# Patient Record
Sex: Female | Born: 1945 | Race: Black or African American | Hispanic: No | Smoking: Former smoker
Health system: Southern US, Community
[De-identification: ages and names within clinical notes are randomized; demographics above are authoritative.]

## PROBLEM LIST (undated history)

## (undated) DIAGNOSIS — K219 Gastro-esophageal reflux disease without esophagitis: Secondary | ICD-10-CM

## (undated) DIAGNOSIS — R0600 Dyspnea, unspecified: Secondary | ICD-10-CM

## (undated) DIAGNOSIS — K224 Dyskinesia of esophagus: Secondary | ICD-10-CM

## (undated) DIAGNOSIS — K222 Esophageal obstruction: Secondary | ICD-10-CM

## (undated) DIAGNOSIS — K449 Diaphragmatic hernia without obstruction or gangrene: Secondary | ICD-10-CM

## (undated) DIAGNOSIS — IMO0002 Reserved for concepts with insufficient information to code with codable children: Secondary | ICD-10-CM

## (undated) DIAGNOSIS — G5790 Unspecified mononeuropathy of unspecified lower limb: Secondary | ICD-10-CM

## (undated) DIAGNOSIS — G4730 Sleep apnea, unspecified: Secondary | ICD-10-CM

## (undated) DIAGNOSIS — D573 Sickle-cell trait: Secondary | ICD-10-CM

## (undated) DIAGNOSIS — K2970 Gastritis, unspecified, without bleeding: Secondary | ICD-10-CM

## (undated) DIAGNOSIS — K5790 Diverticulosis of intestine, part unspecified, without perforation or abscess without bleeding: Secondary | ICD-10-CM

## (undated) DIAGNOSIS — D649 Anemia, unspecified: Secondary | ICD-10-CM

## (undated) DIAGNOSIS — E785 Hyperlipidemia, unspecified: Secondary | ICD-10-CM

## (undated) DIAGNOSIS — K3184 Gastroparesis: Secondary | ICD-10-CM

## (undated) DIAGNOSIS — N183 Chronic kidney disease, stage 3 unspecified: Secondary | ICD-10-CM

## (undated) DIAGNOSIS — F329 Major depressive disorder, single episode, unspecified: Secondary | ICD-10-CM

## (undated) DIAGNOSIS — I10 Essential (primary) hypertension: Secondary | ICD-10-CM

## (undated) DIAGNOSIS — I639 Cerebral infarction, unspecified: Secondary | ICD-10-CM

## (undated) DIAGNOSIS — H269 Unspecified cataract: Secondary | ICD-10-CM

## (undated) DIAGNOSIS — I2510 Atherosclerotic heart disease of native coronary artery without angina pectoris: Secondary | ICD-10-CM

## (undated) DIAGNOSIS — H9193 Unspecified hearing loss, bilateral: Secondary | ICD-10-CM

## (undated) DIAGNOSIS — N259 Disorder resulting from impaired renal tubular function, unspecified: Secondary | ICD-10-CM

## (undated) DIAGNOSIS — G5602 Carpal tunnel syndrome, left upper limb: Secondary | ICD-10-CM

## (undated) DIAGNOSIS — B029 Zoster without complications: Secondary | ICD-10-CM

## (undated) DIAGNOSIS — M1990 Unspecified osteoarthritis, unspecified site: Secondary | ICD-10-CM

## (undated) DIAGNOSIS — F32A Depression, unspecified: Secondary | ICD-10-CM

## (undated) DIAGNOSIS — J449 Chronic obstructive pulmonary disease, unspecified: Secondary | ICD-10-CM

## (undated) DIAGNOSIS — R0902 Hypoxemia: Secondary | ICD-10-CM

## (undated) DIAGNOSIS — T7840XA Allergy, unspecified, initial encounter: Secondary | ICD-10-CM

## (undated) DIAGNOSIS — D126 Benign neoplasm of colon, unspecified: Secondary | ICD-10-CM

## (undated) DIAGNOSIS — R569 Unspecified convulsions: Secondary | ICD-10-CM

## (undated) DIAGNOSIS — I509 Heart failure, unspecified: Secondary | ICD-10-CM

## (undated) DIAGNOSIS — J986 Disorders of diaphragm: Secondary | ICD-10-CM

## (undated) HISTORY — PX: ABDOMINAL HYSTERECTOMY: SHX81

## (undated) HISTORY — DX: Chronic kidney disease, stage 3 unspecified: N18.30

## (undated) HISTORY — DX: Heart failure, unspecified: I50.9

## (undated) HISTORY — DX: Gastroparesis: K31.84

## (undated) HISTORY — DX: Unspecified cataract: H26.9

## (undated) HISTORY — DX: Benign neoplasm of colon, unspecified: D12.6

## (undated) HISTORY — DX: Diverticulosis of intestine, part unspecified, without perforation or abscess without bleeding: K57.90

## (undated) HISTORY — DX: Gastritis, unspecified, without bleeding: K29.70

## (undated) HISTORY — DX: Unspecified mononeuropathy of unspecified lower limb: G57.90

## (undated) HISTORY — DX: Reserved for concepts with insufficient information to code with codable children: IMO0002

## (undated) HISTORY — DX: Sleep apnea, unspecified: G47.30

## (undated) HISTORY — DX: Depression, unspecified: F32.A

## (undated) HISTORY — DX: Atherosclerotic heart disease of native coronary artery without angina pectoris: I25.10

## (undated) HISTORY — DX: Allergy, unspecified, initial encounter: T78.40XA

## (undated) HISTORY — DX: Hyperlipidemia, unspecified: E78.5

## (undated) HISTORY — DX: Essential (primary) hypertension: I10

## (undated) HISTORY — PX: HERNIA REPAIR: SHX51

## (undated) HISTORY — PX: COLONOSCOPY: SHX174

## (undated) HISTORY — DX: Major depressive disorder, single episode, unspecified: F32.9

## (undated) HISTORY — DX: Sickle-cell trait: D57.3

## (undated) HISTORY — DX: Hypoxemia: R09.02

## (undated) HISTORY — PX: OTHER SURGICAL HISTORY: SHX169

## (undated) HISTORY — DX: Disorders of diaphragm: J98.6

## (undated) HISTORY — DX: Unspecified hearing loss, bilateral: H91.93

## (undated) HISTORY — DX: Gastro-esophageal reflux disease without esophagitis: K21.9

## (undated) HISTORY — DX: Chronic kidney disease, stage 3 (moderate): N18.3

## (undated) HISTORY — DX: Unspecified convulsions: R56.9

## (undated) HISTORY — DX: Zoster without complications: B02.9

## (undated) HISTORY — PX: UPPER GASTROINTESTINAL ENDOSCOPY: SHX188

## (undated) HISTORY — DX: Esophageal obstruction: K22.2

## (undated) HISTORY — DX: Carpal tunnel syndrome, left upper limb: G56.02

## (undated) HISTORY — PX: BREAST LUMPECTOMY: SHX2

## (undated) HISTORY — DX: Chronic obstructive pulmonary disease, unspecified: J44.9

## (undated) HISTORY — DX: Dyskinesia of esophagus: K22.4

## (undated) HISTORY — DX: Cerebral infarction, unspecified: I63.9

## (undated) HISTORY — DX: Unspecified osteoarthritis, unspecified site: M19.90

## (undated) HISTORY — DX: Diaphragmatic hernia without obstruction or gangrene: K44.9

## (undated) HISTORY — DX: Anemia, unspecified: D64.9

## (undated) HISTORY — DX: Morbid (severe) obesity due to excess calories: E66.01

## (undated) HISTORY — DX: Disorder resulting from impaired renal tubular function, unspecified: N25.9

---

## 1996-01-22 HISTORY — PX: REPLACEMENT TOTAL KNEE: SUR1224

## 1997-04-21 ENCOUNTER — Encounter: Admission: RE | Admit: 1997-04-21 | Discharge: 1997-07-20 | Payer: Self-pay | Admitting: *Deleted

## 1997-06-06 ENCOUNTER — Ambulatory Visit (HOSPITAL_COMMUNITY): Admission: RE | Admit: 1997-06-06 | Discharge: 1997-06-06 | Payer: Self-pay | Admitting: Family Medicine

## 1997-08-08 ENCOUNTER — Inpatient Hospital Stay (HOSPITAL_COMMUNITY): Admission: RE | Admit: 1997-08-08 | Discharge: 1997-08-16 | Payer: Self-pay | Admitting: Orthopedic Surgery

## 1997-09-13 ENCOUNTER — Encounter: Admission: RE | Admit: 1997-09-13 | Discharge: 1997-12-12 | Payer: Self-pay | Admitting: Orthopedic Surgery

## 1997-09-22 ENCOUNTER — Ambulatory Visit (HOSPITAL_COMMUNITY): Admission: RE | Admit: 1997-09-22 | Discharge: 1997-09-22 | Payer: Self-pay | Admitting: Orthopedic Surgery

## 1998-01-24 ENCOUNTER — Ambulatory Visit (HOSPITAL_COMMUNITY): Admission: RE | Admit: 1998-01-24 | Discharge: 1998-01-25 | Payer: Self-pay | Admitting: Interventional Cardiology

## 1998-02-10 ENCOUNTER — Ambulatory Visit (HOSPITAL_COMMUNITY): Admission: RE | Admit: 1998-02-10 | Discharge: 1998-02-10 | Payer: Self-pay | Admitting: Interventional Cardiology

## 1998-08-17 ENCOUNTER — Emergency Department (HOSPITAL_COMMUNITY): Admission: EM | Admit: 1998-08-17 | Discharge: 1998-08-17 | Payer: Self-pay | Admitting: Emergency Medicine

## 1998-08-18 ENCOUNTER — Encounter: Payer: Self-pay | Admitting: Emergency Medicine

## 1998-08-23 ENCOUNTER — Encounter: Admission: RE | Admit: 1998-08-23 | Discharge: 1998-11-21 | Payer: Self-pay | Admitting: Endocrinology

## 1998-09-05 ENCOUNTER — Other Ambulatory Visit: Admission: RE | Admit: 1998-09-05 | Discharge: 1998-09-05 | Payer: Self-pay | Admitting: Nurse Practitioner

## 1999-11-08 ENCOUNTER — Encounter: Admission: RE | Admit: 1999-11-08 | Discharge: 2000-02-06 | Payer: Self-pay | Admitting: Endocrinology

## 2000-03-07 ENCOUNTER — Encounter: Admission: RE | Admit: 2000-03-07 | Discharge: 2000-04-22 | Payer: Self-pay | Admitting: Orthopedic Surgery

## 2000-07-08 ENCOUNTER — Other Ambulatory Visit: Admission: RE | Admit: 2000-07-08 | Discharge: 2000-07-08 | Payer: Self-pay | Admitting: Obstetrics and Gynecology

## 2000-08-08 ENCOUNTER — Encounter: Payer: Self-pay | Admitting: Gastroenterology

## 2000-08-08 ENCOUNTER — Ambulatory Visit (HOSPITAL_COMMUNITY): Admission: RE | Admit: 2000-08-08 | Discharge: 2000-08-08 | Payer: Self-pay | Admitting: Gastroenterology

## 2000-08-08 HISTORY — PX: OTHER SURGICAL HISTORY: SHX169

## 2000-08-26 ENCOUNTER — Ambulatory Visit (HOSPITAL_COMMUNITY): Admission: RE | Admit: 2000-08-26 | Discharge: 2000-08-26 | Payer: Self-pay | Admitting: Gastroenterology

## 2000-08-26 ENCOUNTER — Encounter (INDEPENDENT_AMBULATORY_CARE_PROVIDER_SITE_OTHER): Payer: Self-pay | Admitting: Specialist

## 2000-10-20 ENCOUNTER — Encounter: Admission: RE | Admit: 2000-10-20 | Discharge: 2000-10-20 | Payer: Self-pay | Admitting: Endocrinology

## 2000-10-20 ENCOUNTER — Encounter: Payer: Self-pay | Admitting: Endocrinology

## 2000-11-13 ENCOUNTER — Ambulatory Visit (HOSPITAL_COMMUNITY): Admission: RE | Admit: 2000-11-13 | Discharge: 2000-11-13 | Payer: Self-pay | Admitting: Gastroenterology

## 2000-11-13 ENCOUNTER — Encounter: Payer: Self-pay | Admitting: Gastroenterology

## 2000-11-24 ENCOUNTER — Encounter: Admission: RE | Admit: 2000-11-24 | Discharge: 2001-02-11 | Payer: Self-pay | Admitting: Orthopedic Surgery

## 2001-04-09 ENCOUNTER — Encounter: Admission: RE | Admit: 2001-04-09 | Discharge: 2001-04-09 | Payer: Self-pay | Admitting: Gastroenterology

## 2001-04-09 ENCOUNTER — Encounter: Payer: Self-pay | Admitting: Gastroenterology

## 2001-05-01 ENCOUNTER — Ambulatory Visit (HOSPITAL_COMMUNITY): Admission: RE | Admit: 2001-05-01 | Discharge: 2001-05-01 | Payer: Self-pay | Admitting: Gastroenterology

## 2001-05-01 ENCOUNTER — Encounter: Payer: Self-pay | Admitting: Gastroenterology

## 2001-07-10 ENCOUNTER — Encounter: Payer: Self-pay | Admitting: *Deleted

## 2001-07-10 ENCOUNTER — Ambulatory Visit (HOSPITAL_COMMUNITY): Admission: RE | Admit: 2001-07-10 | Discharge: 2001-07-10 | Payer: Self-pay | Admitting: *Deleted

## 2001-08-31 ENCOUNTER — Encounter: Admission: RE | Admit: 2001-08-31 | Discharge: 2001-11-17 | Payer: Self-pay | Admitting: Orthopedic Surgery

## 2001-09-28 ENCOUNTER — Other Ambulatory Visit: Admission: RE | Admit: 2001-09-28 | Discharge: 2001-09-28 | Payer: Self-pay | Admitting: Obstetrics and Gynecology

## 2002-01-31 ENCOUNTER — Emergency Department (HOSPITAL_COMMUNITY): Admission: EM | Admit: 2002-01-31 | Discharge: 2002-02-01 | Payer: Self-pay | Admitting: Emergency Medicine

## 2002-03-11 ENCOUNTER — Ambulatory Visit (HOSPITAL_COMMUNITY): Admission: RE | Admit: 2002-03-11 | Discharge: 2002-03-11 | Payer: Self-pay | Admitting: Endocrinology

## 2002-03-11 ENCOUNTER — Encounter: Payer: Self-pay | Admitting: Endocrinology

## 2002-09-23 ENCOUNTER — Ambulatory Visit (HOSPITAL_COMMUNITY): Admission: RE | Admit: 2002-09-23 | Discharge: 2002-09-23 | Payer: Self-pay | Admitting: Endocrinology

## 2002-09-23 ENCOUNTER — Encounter: Payer: Self-pay | Admitting: Endocrinology

## 2002-10-05 ENCOUNTER — Encounter: Payer: Self-pay | Admitting: Endocrinology

## 2002-10-05 ENCOUNTER — Ambulatory Visit (HOSPITAL_COMMUNITY): Admission: RE | Admit: 2002-10-05 | Discharge: 2002-10-05 | Payer: Self-pay | Admitting: Endocrinology

## 2003-04-22 ENCOUNTER — Other Ambulatory Visit: Admission: RE | Admit: 2003-04-22 | Discharge: 2003-04-22 | Payer: Self-pay | Admitting: Obstetrics and Gynecology

## 2003-04-27 ENCOUNTER — Encounter: Admission: RE | Admit: 2003-04-27 | Discharge: 2003-06-06 | Payer: Self-pay | Admitting: Orthopedic Surgery

## 2003-07-05 ENCOUNTER — Ambulatory Visit (HOSPITAL_COMMUNITY): Admission: RE | Admit: 2003-07-05 | Discharge: 2003-07-05 | Payer: Self-pay | Admitting: Gastroenterology

## 2003-07-05 ENCOUNTER — Encounter (INDEPENDENT_AMBULATORY_CARE_PROVIDER_SITE_OTHER): Payer: Self-pay | Admitting: Specialist

## 2003-07-13 ENCOUNTER — Encounter
Admission: RE | Admit: 2003-07-13 | Discharge: 2003-10-11 | Payer: Self-pay | Admitting: Physical Medicine & Rehabilitation

## 2003-07-27 ENCOUNTER — Ambulatory Visit (HOSPITAL_COMMUNITY): Admission: RE | Admit: 2003-07-27 | Discharge: 2003-07-27 | Payer: Self-pay | Admitting: Gastroenterology

## 2003-07-28 ENCOUNTER — Inpatient Hospital Stay (HOSPITAL_BASED_OUTPATIENT_CLINIC_OR_DEPARTMENT_OTHER): Admission: RE | Admit: 2003-07-28 | Discharge: 2003-07-28 | Payer: Self-pay | Admitting: Cardiovascular Disease

## 2003-09-22 ENCOUNTER — Ambulatory Visit (HOSPITAL_COMMUNITY): Admission: RE | Admit: 2003-09-22 | Discharge: 2003-09-22 | Payer: Self-pay | Admitting: Gastroenterology

## 2003-12-01 ENCOUNTER — Ambulatory Visit: Payer: Self-pay | Admitting: Endocrinology

## 2004-01-04 ENCOUNTER — Ambulatory Visit: Payer: Self-pay | Admitting: Gastroenterology

## 2004-01-11 ENCOUNTER — Ambulatory Visit: Payer: Self-pay | Admitting: Gastroenterology

## 2004-01-11 ENCOUNTER — Ambulatory Visit (HOSPITAL_COMMUNITY): Admission: RE | Admit: 2004-01-11 | Discharge: 2004-01-11 | Payer: Self-pay | Admitting: Gastroenterology

## 2004-01-12 ENCOUNTER — Ambulatory Visit: Payer: Self-pay | Admitting: Endocrinology

## 2004-01-13 ENCOUNTER — Ambulatory Visit: Payer: Self-pay | Admitting: Endocrinology

## 2004-01-19 ENCOUNTER — Ambulatory Visit (HOSPITAL_COMMUNITY): Admission: RE | Admit: 2004-01-19 | Discharge: 2004-01-19 | Payer: Self-pay | Admitting: Endocrinology

## 2004-01-27 ENCOUNTER — Ambulatory Visit: Payer: Self-pay | Admitting: Internal Medicine

## 2004-02-03 ENCOUNTER — Ambulatory Visit: Payer: Self-pay | Admitting: Internal Medicine

## 2004-02-08 ENCOUNTER — Ambulatory Visit: Payer: Self-pay | Admitting: Endocrinology

## 2004-02-20 ENCOUNTER — Ambulatory Visit: Payer: Self-pay | Admitting: Endocrinology

## 2004-02-21 ENCOUNTER — Ambulatory Visit: Payer: Self-pay | Admitting: Internal Medicine

## 2004-02-28 ENCOUNTER — Ambulatory Visit: Payer: Self-pay | Admitting: Internal Medicine

## 2004-03-30 ENCOUNTER — Emergency Department (HOSPITAL_COMMUNITY): Admission: EM | Admit: 2004-03-30 | Discharge: 2004-03-31 | Payer: Self-pay | Admitting: Emergency Medicine

## 2004-05-17 ENCOUNTER — Ambulatory Visit: Payer: Self-pay | Admitting: Endocrinology

## 2004-06-12 ENCOUNTER — Ambulatory Visit: Payer: Self-pay | Admitting: Endocrinology

## 2004-06-14 ENCOUNTER — Ambulatory Visit: Payer: Self-pay | Admitting: Endocrinology

## 2004-07-16 ENCOUNTER — Ambulatory Visit: Payer: Self-pay | Admitting: Endocrinology

## 2004-08-20 ENCOUNTER — Ambulatory Visit: Payer: Self-pay | Admitting: Endocrinology

## 2004-10-20 IMAGING — RF DG ESOPHAGUS
13 of 14 series · 19 of 24 positions shown · non-contrast
Comparison: none

CLINICAL DATA: Dysphagia.  Chest discomfort.  Patient had dilatation on 07/05/03.  
ESOPHAGOGRAM
The study was performed with air-contrast technique with the patient in erect, prone and supine positions.  Swallowing function is normal.  There is diffuse spondylosis of the cervical spine with mild extrinsic compression on the cervical esophagus.  There is mild prominence of the cricopharyngeus muscle.  Primary peristaltic wave is satisfactory with satisfactory emptying into the stomach.   No obstruction of the passage of a 13 mm tablet through the cervical esophagus or through the mid or distal esophagus.   There is no obstruction of the esophagus.   
There is noted to be a moderate degree of gastroesophageal reflux without a significant hiatal hernia.  
IMPRESSION  Moderate degree of GE reflux

[Series 1: run · 2 of 17 slices shown (1 of 13)]
[im 1/17]
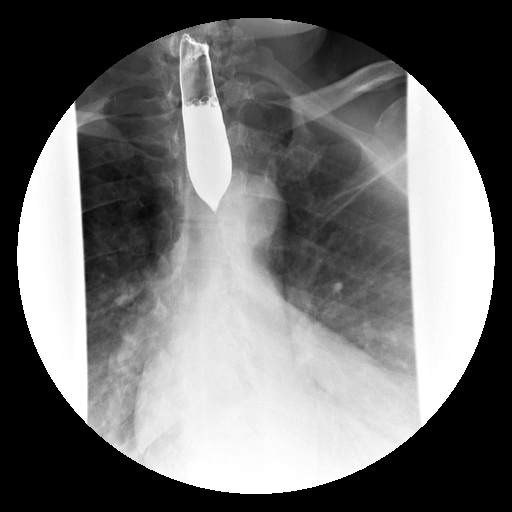
[im 17/17]
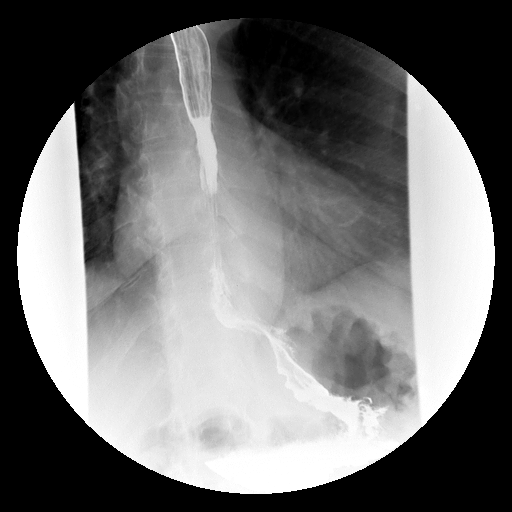

[Series 2: run · 1 of 19 slices shown (2 of 13)]
[im 19/19]
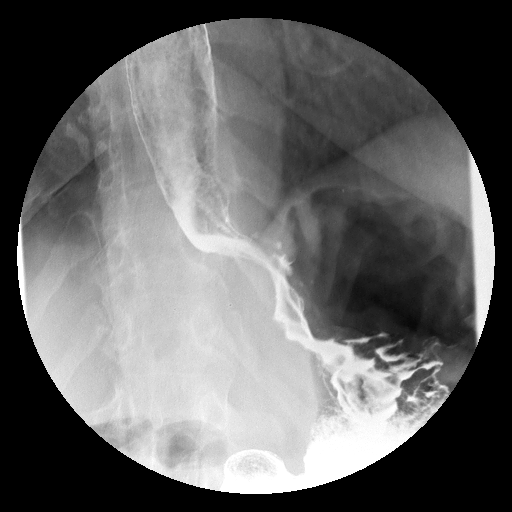

[Series 3: run · 1 of 10 slices shown (3 of 13)]
[im 1/10]
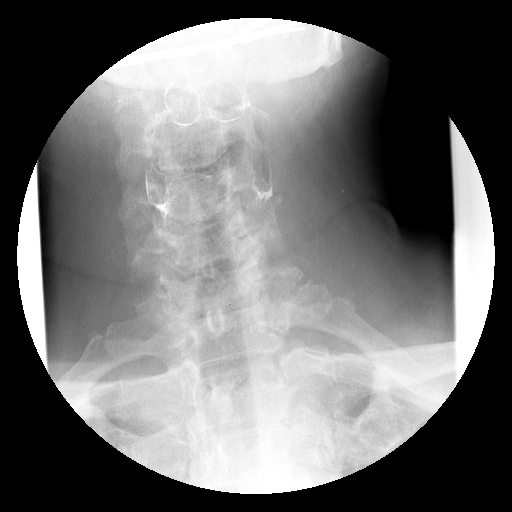

[Series 4: run · 1 of 13 slices shown (4 of 13)]
[im 1/13]
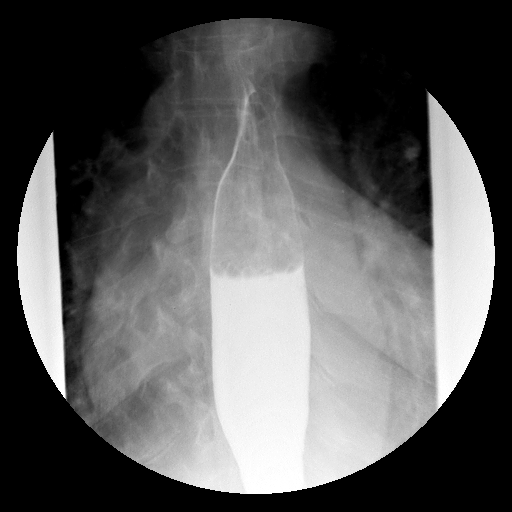

[Series 5: run · 1 of 22 slices shown (5 of 13)]
[im 1/22]
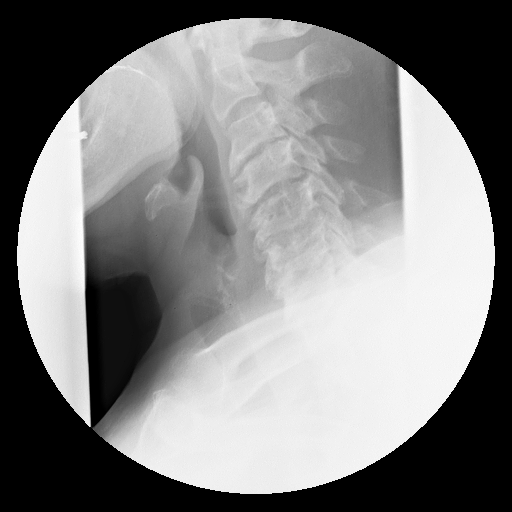

[Series 6: run · 3 of 21 slices shown (6 of 13)]
[im 1/21]
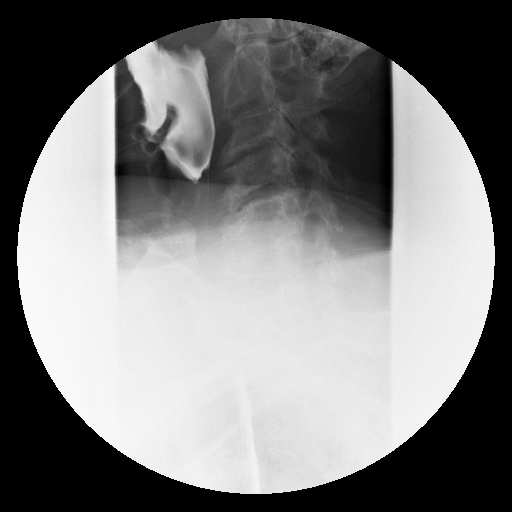
[im 11/21]
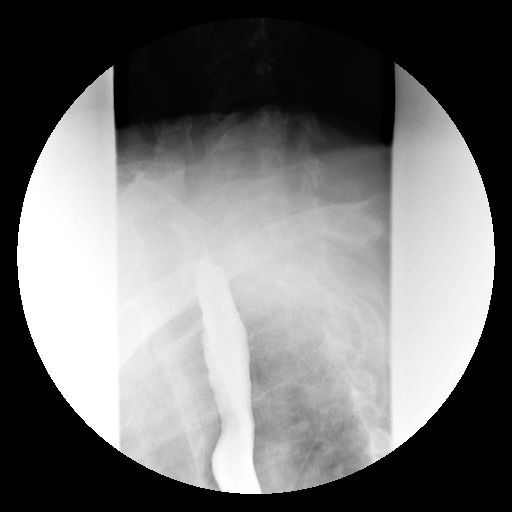
[im 21/21]
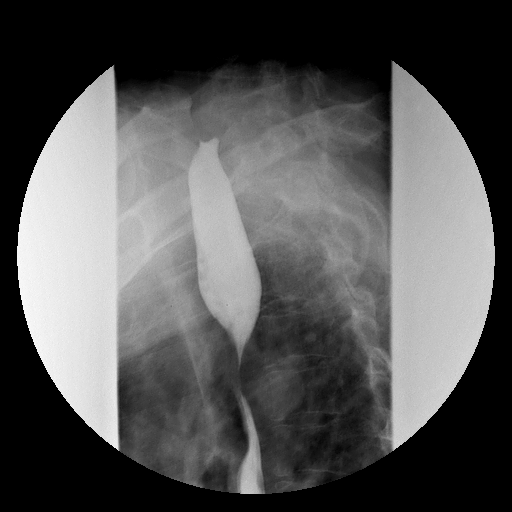

[Series 7: run · 2 of 22 slices shown (7 of 13)]
[im 11/22]
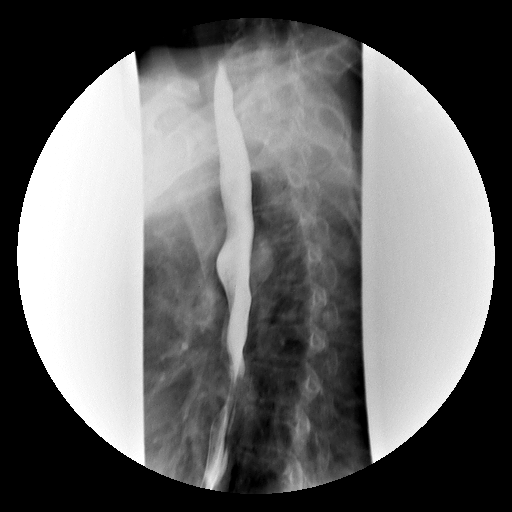
[im 22/22]
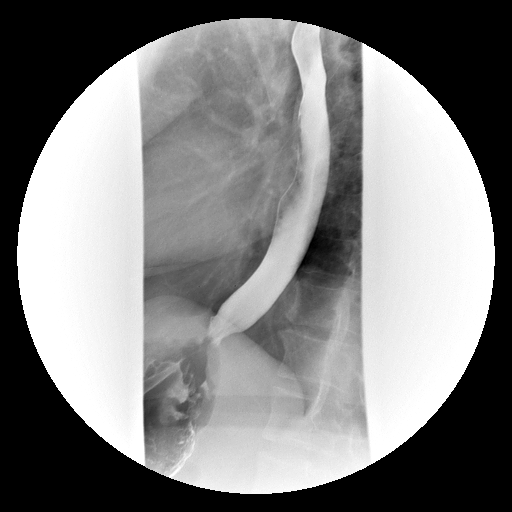

[Series 8: run · 1 of 4 slices shown (8 of 13)]
[im 1/4]
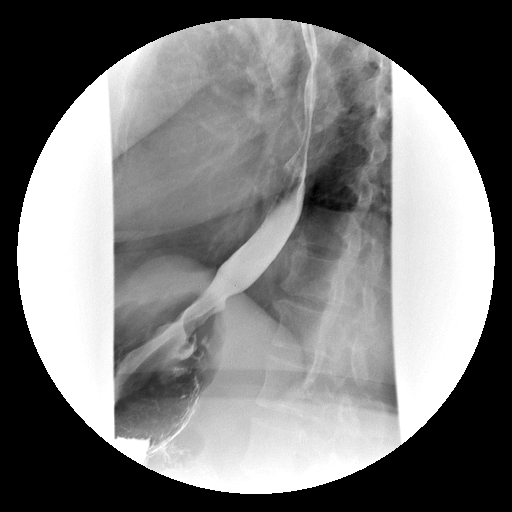

[Series 9: run · 3 of 25 slices shown (9 of 13)]
[im 1/25]
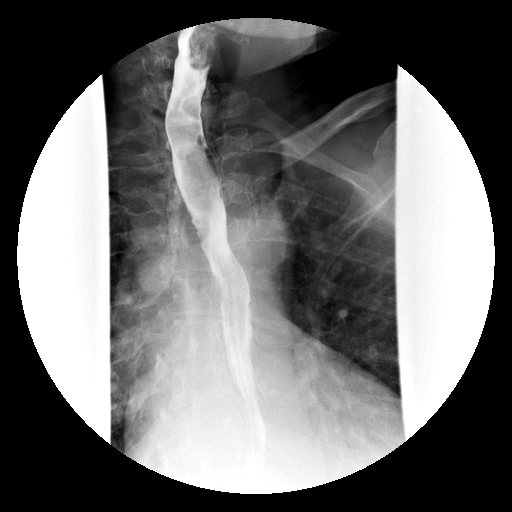
[im 17/25]
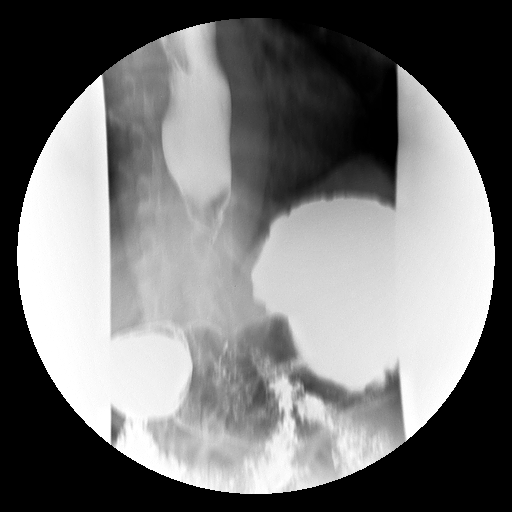
[im 25/25]
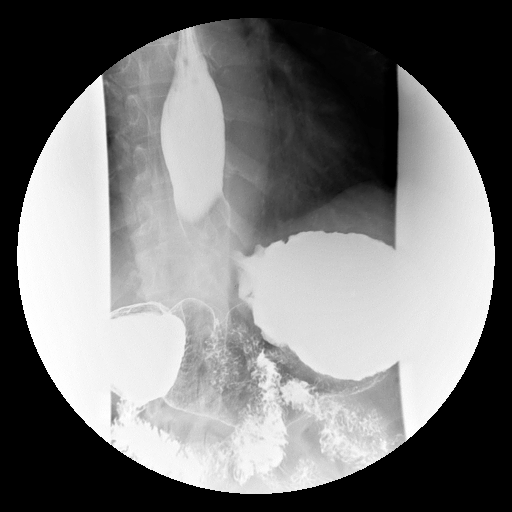

[Series 10: run · 1 of 3 slices shown (10 of 13)]
[im 1/3]
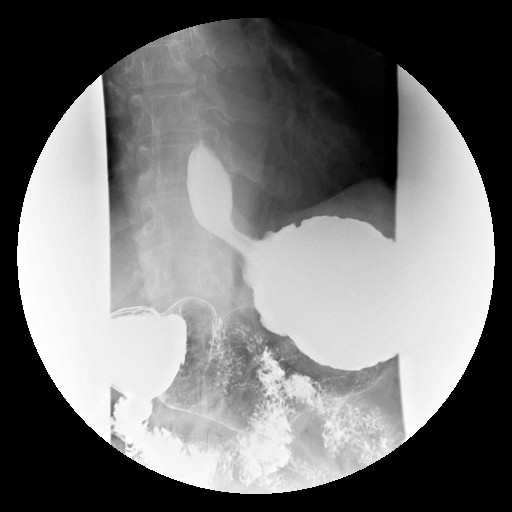

[Series 11: run · 1 of 1 slices shown (11 of 13)]
[im 1/1]
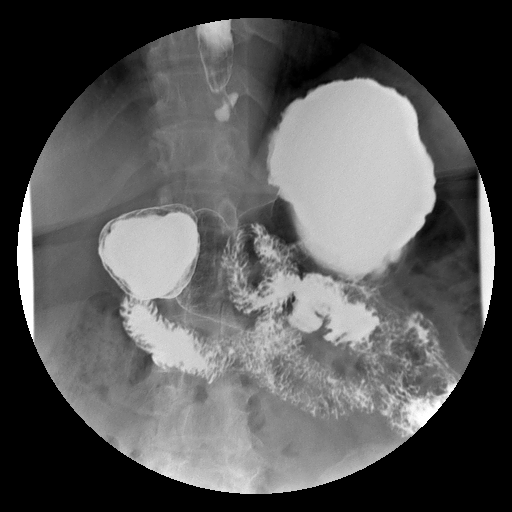

[Series 13: run · 1 of 1 slices shown (12 of 13)]
[im 1/1]
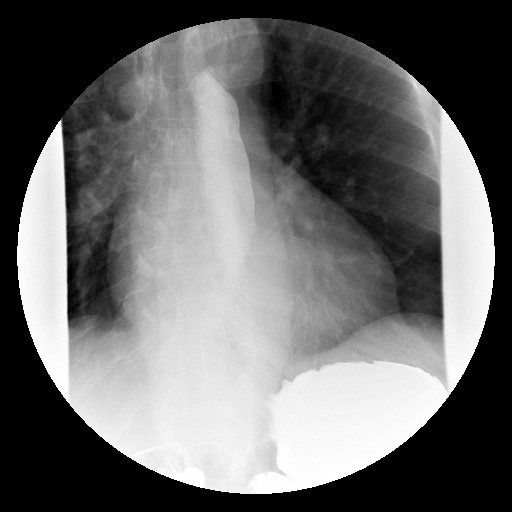

[Series 14: run · 1 of 1 slices shown (13 of 13)]
[im 1/1]
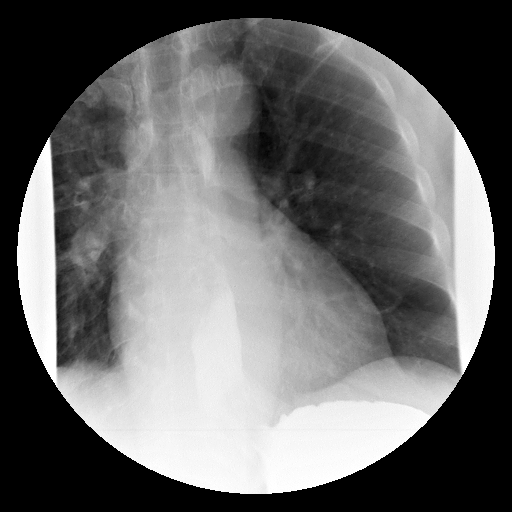

[19 of 24 positions shown; findings below may reference images not displayed]

## 2004-11-09 ENCOUNTER — Ambulatory Visit: Payer: Self-pay | Admitting: Endocrinology

## 2004-11-15 ENCOUNTER — Ambulatory Visit: Payer: Self-pay | Admitting: Cardiology

## 2004-11-30 ENCOUNTER — Ambulatory Visit: Payer: Self-pay | Admitting: Endocrinology

## 2004-12-05 ENCOUNTER — Encounter: Admission: RE | Admit: 2004-12-05 | Discharge: 2005-01-02 | Payer: Self-pay | Admitting: Orthopedic Surgery

## 2004-12-16 IMAGING — NM NM GASTRIC EMPTYING
1 series · 1 of 1 positions shown · non-contrast
Comparison: none

CLINICAL DATA: nausea, bloating, diabetes.
 NUCLEAR MEDICINE GASTRIC EMPTYING STUDY
 No prior studies.
 Exam was performed using 2.2 millicuries technetium 99 sulfur colloid administered orally with one egg.

[gastric empty · 0.80mm/px · 1 of 1 slices shown]
[im 1/1]
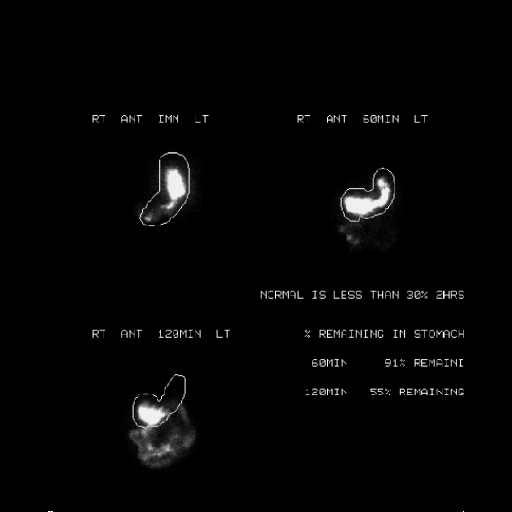

[1 of 1 positions shown; findings below may reference images not displayed]

FINDINGS: At 60 minutes following oral administration, 91% of activity remained in the stomach.  At 120 minutes, 55% remained in the stomach.  Normally, 50% of activity is eliminated by two hours.
 IMPRESSION
 Delayed gastric emptying.

## 2005-01-03 ENCOUNTER — Encounter: Admission: RE | Admit: 2005-01-03 | Discharge: 2005-04-03 | Payer: Self-pay | Admitting: Orthopedic Surgery

## 2005-01-08 ENCOUNTER — Emergency Department (HOSPITAL_COMMUNITY): Admission: EM | Admit: 2005-01-08 | Discharge: 2005-01-09 | Payer: Self-pay | Admitting: Emergency Medicine

## 2005-01-11 ENCOUNTER — Ambulatory Visit: Payer: Self-pay | Admitting: Internal Medicine

## 2005-01-17 ENCOUNTER — Ambulatory Visit: Payer: Self-pay | Admitting: Endocrinology

## 2005-01-24 ENCOUNTER — Ambulatory Visit: Payer: Self-pay | Admitting: Endocrinology

## 2005-01-25 ENCOUNTER — Ambulatory Visit (HOSPITAL_COMMUNITY): Admission: RE | Admit: 2005-01-25 | Discharge: 2005-01-25 | Payer: Self-pay | Admitting: Endocrinology

## 2005-02-18 ENCOUNTER — Ambulatory Visit: Payer: Self-pay | Admitting: Endocrinology

## 2005-03-01 ENCOUNTER — Ambulatory Visit: Payer: Self-pay | Admitting: Internal Medicine

## 2005-03-08 ENCOUNTER — Ambulatory Visit: Payer: Self-pay | Admitting: Internal Medicine

## 2005-03-11 ENCOUNTER — Ambulatory Visit: Payer: Self-pay | Admitting: Gastroenterology

## 2005-03-18 ENCOUNTER — Ambulatory Visit: Payer: Self-pay | Admitting: Endocrinology

## 2005-03-20 ENCOUNTER — Ambulatory Visit: Payer: Self-pay | Admitting: Gastroenterology

## 2005-03-20 ENCOUNTER — Encounter (INDEPENDENT_AMBULATORY_CARE_PROVIDER_SITE_OTHER): Payer: Self-pay | Admitting: *Deleted

## 2005-04-14 IMAGING — MR MR HEAD WO/W CM
7 of 9 series · 29 of 48 positions shown · IV contrast (gadolinium)
Comparison: none

CLINICAL DATA: Patient with headaches, dizziness, blurred vision, and earaches.
MRI OF THE BRAIN PRE- AND POST- INFUSION:
Multiplanar and multiecho images of the brain were obtained before and after infusion of 20 cc of gadolinium intravenously.
The gray-white matter differentiation is normal.   The sella is normal.  The cerebellar tonsils are at the level of the foramen magnum.
The odontoid process, predental space, and the prevertebral soft tissues are normal.
Axial diffusion-weighted images demonstrate no evidence of acute ischemia.
Axial FLAIR and T2-weighted images demonstrate multiple foci of hyperintensity involving the subcortical white matter bifrontally, slightly more prominent on the right side, the centrum semiovale bilaterally, and the trigonal white matter regions.  Smaller similar hyperintensities are seen in the parasagittal and bifrontal regions bilaterally.  
There is a small wedge-shaped area of encephalomalacia along the left cerebellar hemisphere most consistent with an old infarct.
There is mild periventricular hyperintensity of a confluent nature.  The ventricles themselves are normal in size.
The mastoid air cells are normally developed and aerated.  There is mild thickening of the mucosa in the ethmoid air cells bilaterally including the frontal sinuses.  There is a suggestion of a probable mucus retention cyst in the right maxillary sinus.  The visualized orbital contents are normal.  
Post-infusion images demonstrate no evidence of pathologic intracranial enhancement.  The visualized dural venous sinuses are also patent.

[Series 2: FLAIR · axial · 5.0mm · 0.45mm/px · z∈[-14,+123]mm · 4 of 24 slices shown]
[im 1/24]
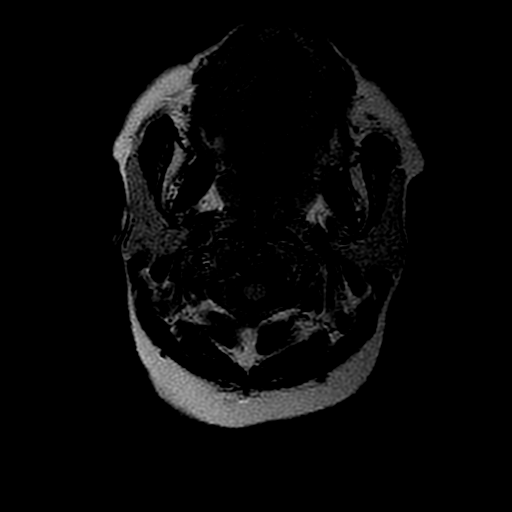
[im 8/24]
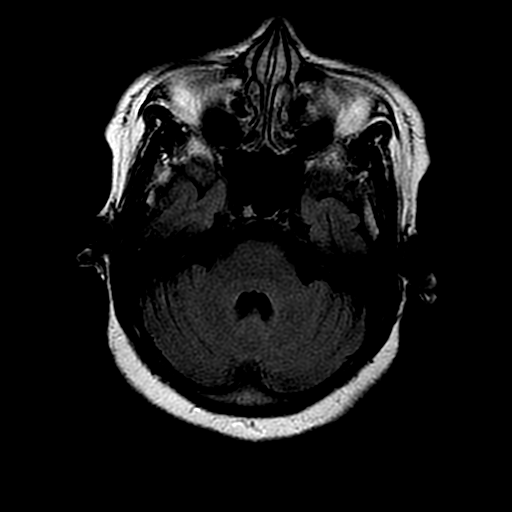
[im 16/24]
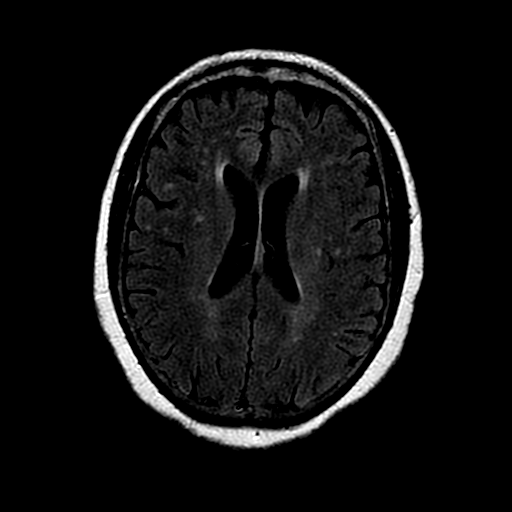
[im 24/24]
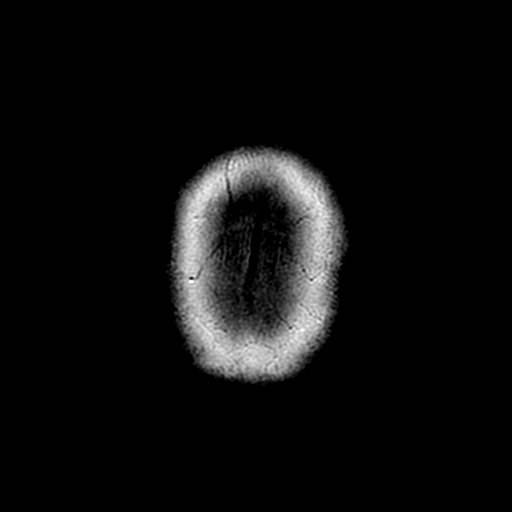

[Series 3: T2 · axial · 5.0mm · 0.47mm/px · z∈[-13,+124]mm · 4 of 24 slices shown]
[im 1/24]
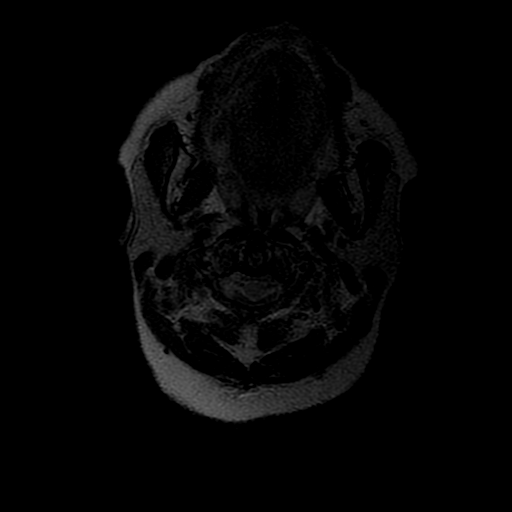
[im 8/24]
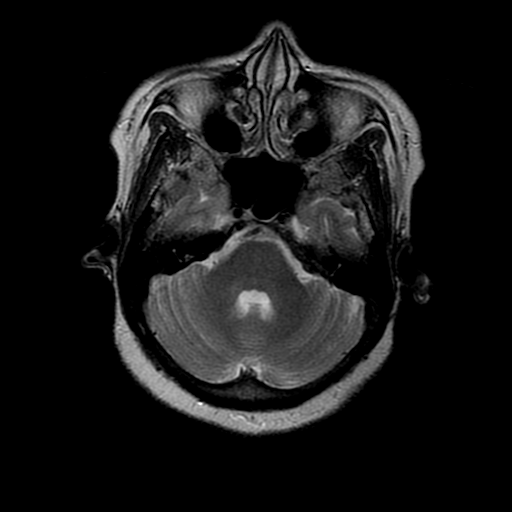
[im 16/24]
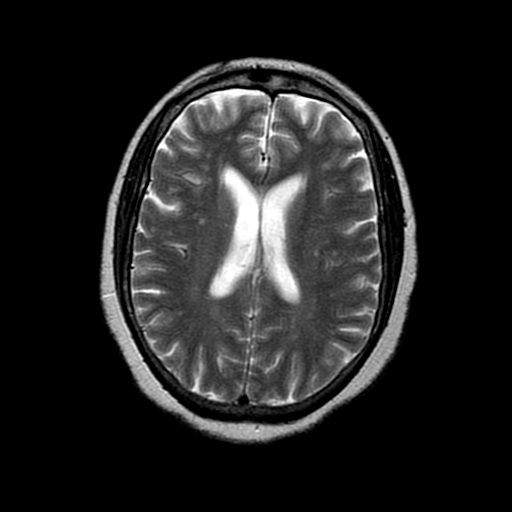
[im 24/24]
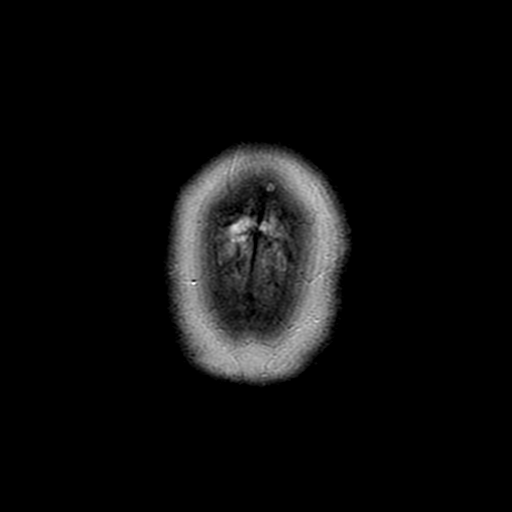

[Series 5: T1 · sagittal · 5.0mm · 0.47mm/px · 1 of 12 slices shown]
[im 1/12]
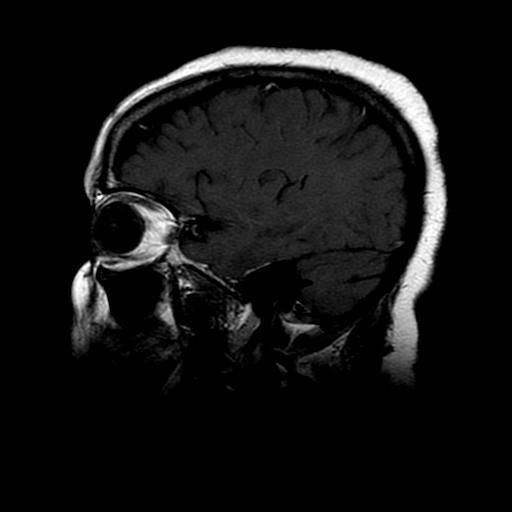

[Series 6: DWI · axial · 5.0mm · 0.94mm/px · z∈[-9,+128]mm · 8 of 52 slices shown (1 of 3)]
[im 1/52]
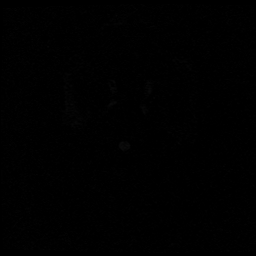
[im 8/52]
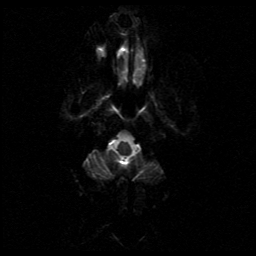
[im 15/52]
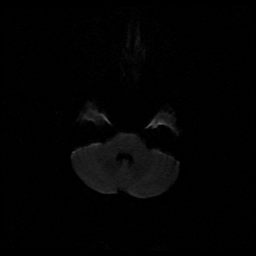
[im 22/52]
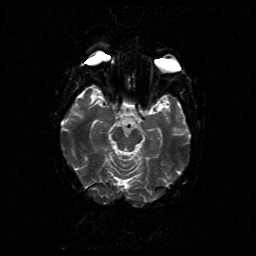
[im 30/52]
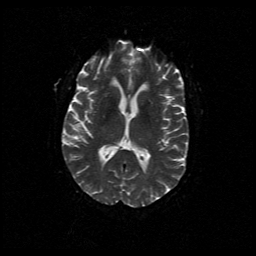
[im 37/52]
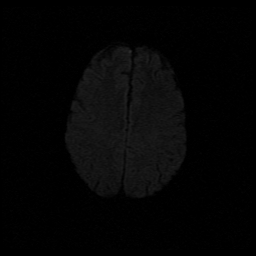
[im 44/52]
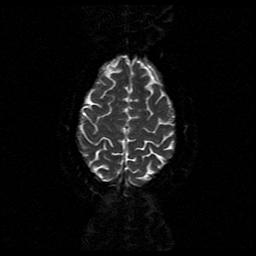
[im 52/52]
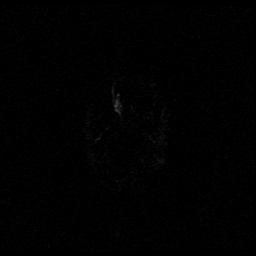

[Series 8: T1 post-contrast · coronal · 5.0mm · 0.43mm/px · 4 of 24 slices shown]
[im 1/24]
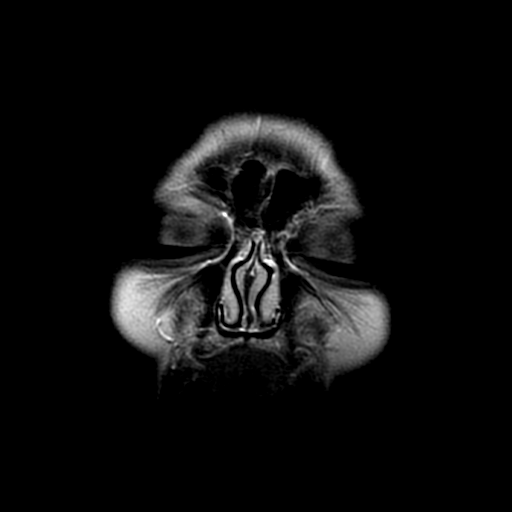
[im 8/24]
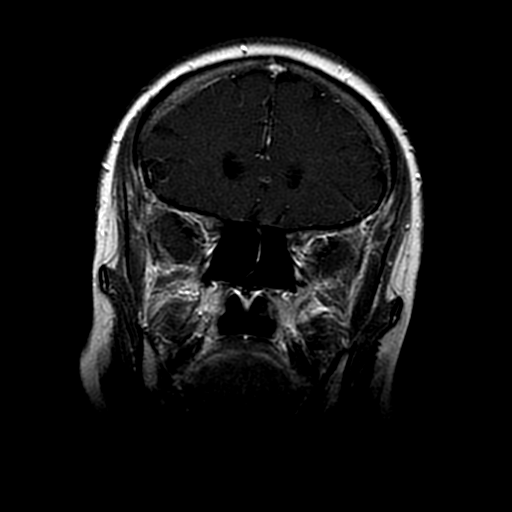
[im 16/24]
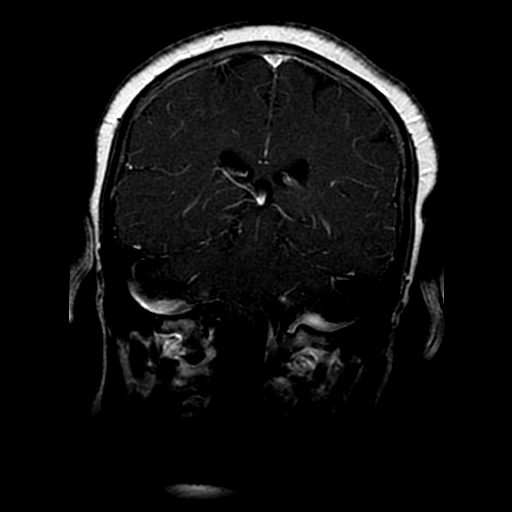
[im 24/24]
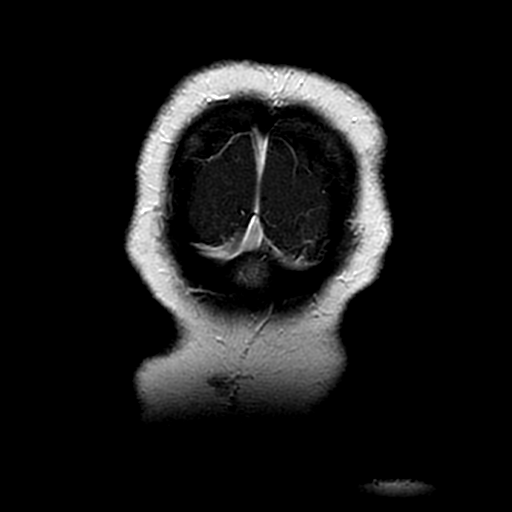

[Series 656: DWI · axial · 5.0mm · 0.94mm/px · z∈[-9,+128]mm · 4 of 26 slices shown (2 of 3)]
[im 1/26]
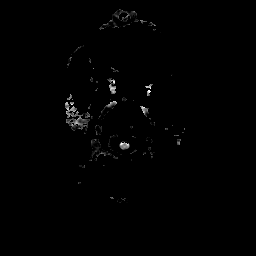
[im 9/26]
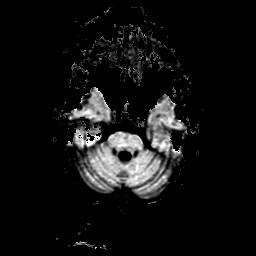
[im 17/26]
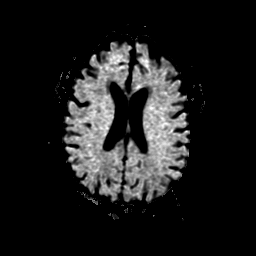
[im 26/26]
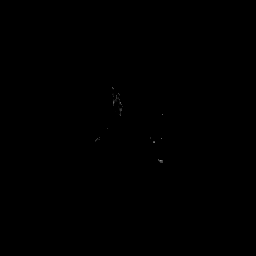

[Series 657: DWI · axial · 5.0mm · 0.94mm/px · z∈[-9,+128]mm · 4 of 26 slices shown (3 of 3)]
[im 1/26]
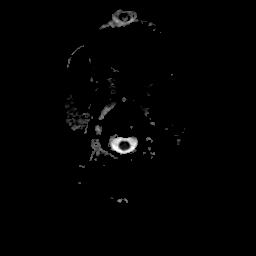
[im 9/26]
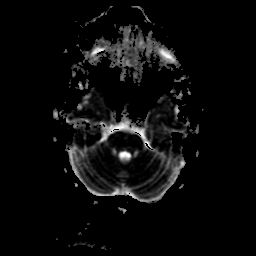
[im 17/26]
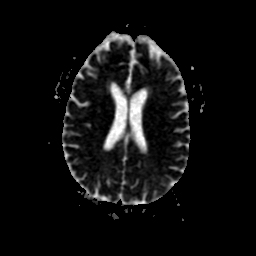
[im 26/26]
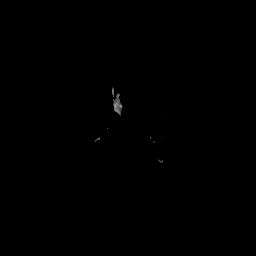

[29 of 48 positions shown; findings below may reference images not displayed]

IMPRESSION: 1.  No evidence of acute ischemia.
2.  Nonspecific white matter changes as described above.  Differential considerations include changes related ischemic gliosis from small vessel disease and/or hypertension versus vasculitis with less likely possibility of a demyelinating process.
3.  Old ischemic infarct along the left cerebellar hemisphere.
4.  Mild sinusitis changes in the ethmoid and frontal sinuses with a probable mucus retention cyst in the right maxillary sinus.
Given the patient?s history and MRI findings, evaluation with an MRA examination of the head and neck may be helpful to evaluate for cerebrovascular disease.

## 2005-04-16 ENCOUNTER — Ambulatory Visit: Payer: Self-pay | Admitting: Internal Medicine

## 2005-04-29 ENCOUNTER — Ambulatory Visit: Payer: Self-pay | Admitting: Gastroenterology

## 2005-05-02 ENCOUNTER — Ambulatory Visit: Payer: Self-pay | Admitting: Endocrinology

## 2005-06-05 ENCOUNTER — Ambulatory Visit: Payer: Self-pay | Admitting: Endocrinology

## 2005-06-20 ENCOUNTER — Ambulatory Visit (HOSPITAL_COMMUNITY): Admission: RE | Admit: 2005-06-20 | Discharge: 2005-06-20 | Payer: Self-pay | Admitting: Obstetrics and Gynecology

## 2005-06-25 IMAGING — CR DG CHEST 2V
2 series · 2 of 2 positions shown · non-contrast
Comparison: none

CLINICAL DATA: Chest pain, dyspnea, chills, and nausea.  Diabetes.
 2-VIEW CHEST:

[view not recorded (1 of 2)]
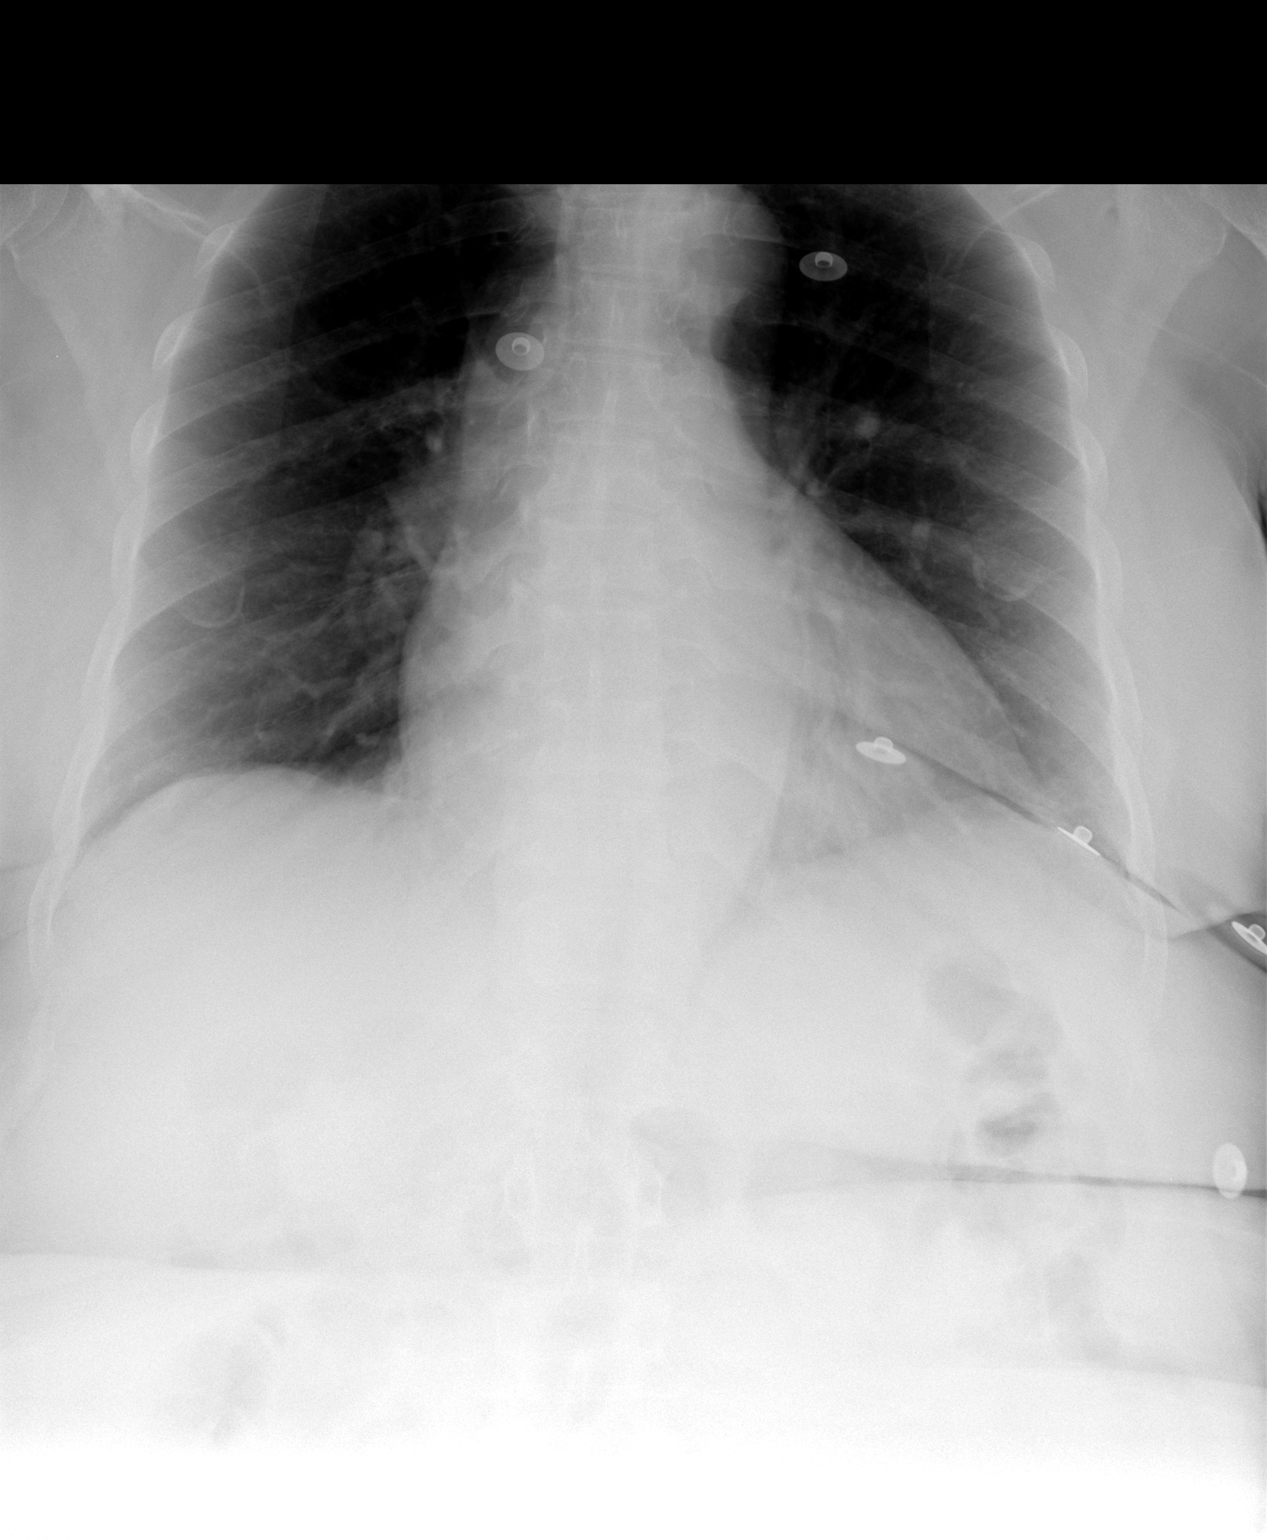

[view not recorded (2 of 2)]
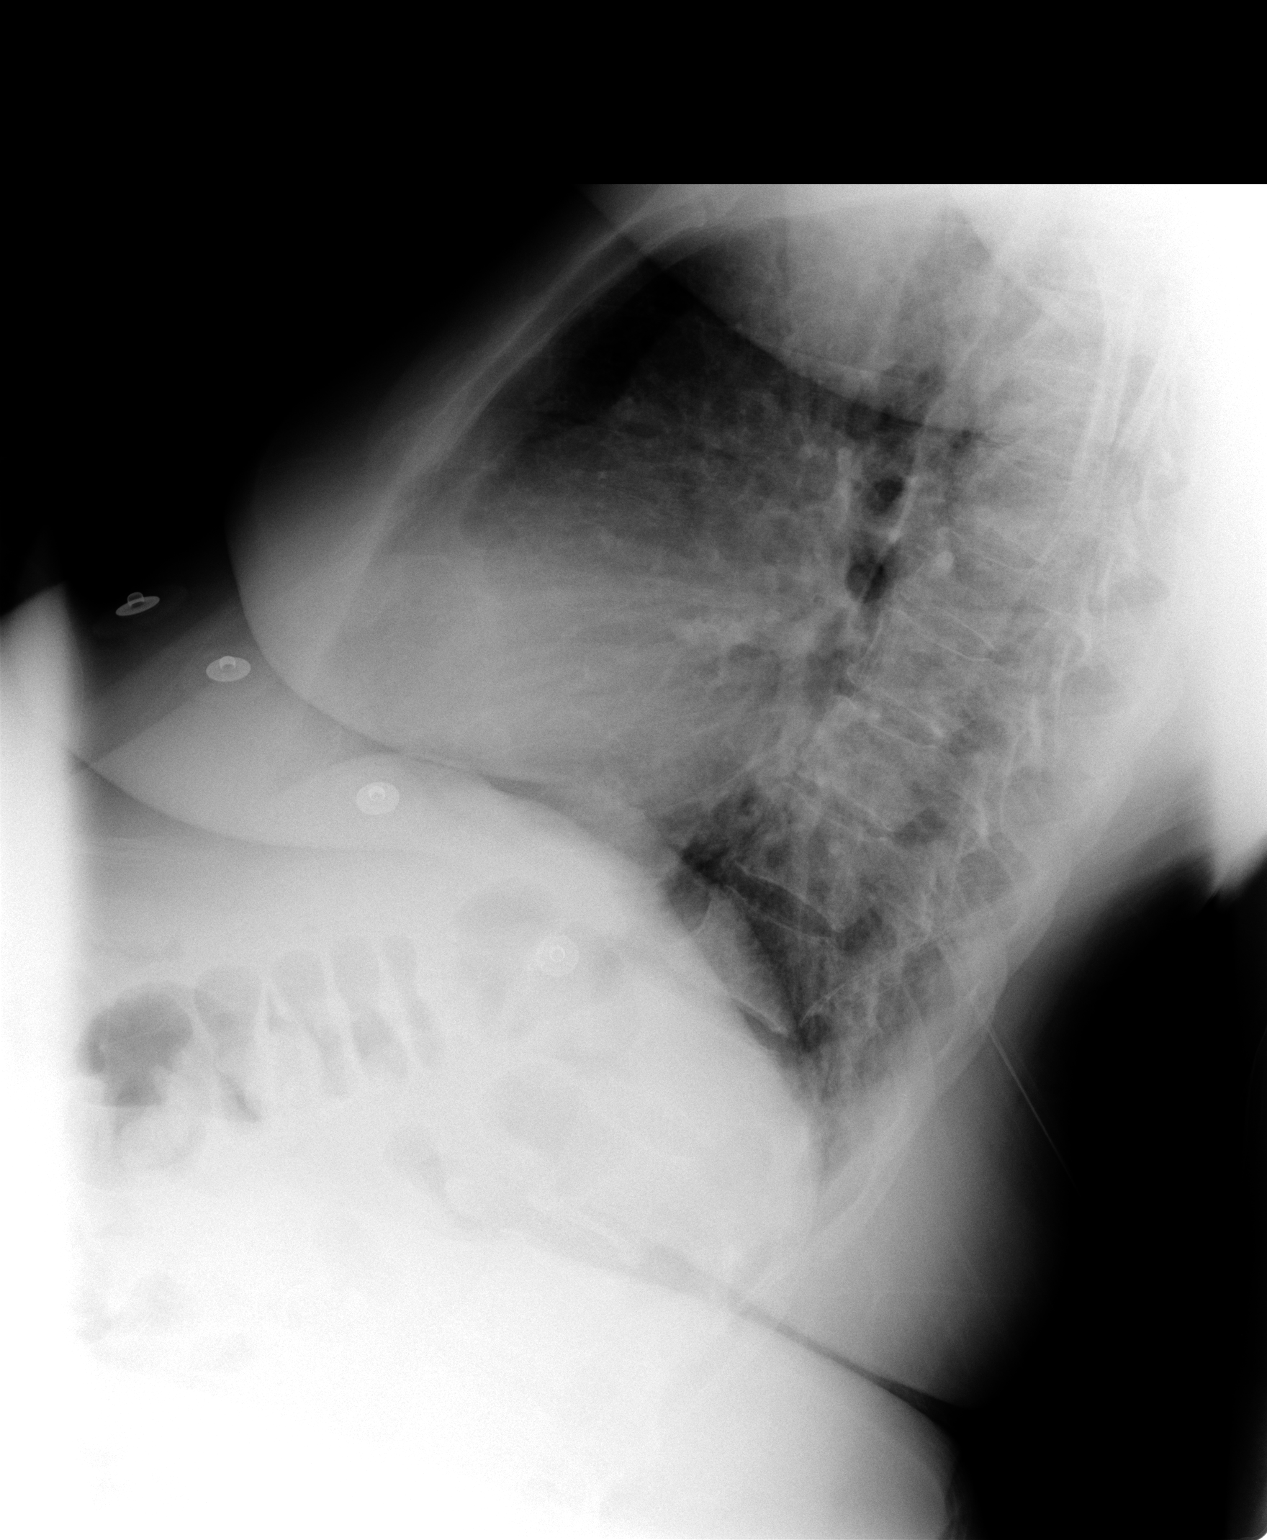

[2 of 2 positions shown; findings below may reference images not displayed]

FINDINGS: Cardiomegaly is noted.  Peribronchial thickening is present without focal air space disease.  The lungs are otherwise clear.  No pleural effusions.
IMPRESSION: 1.  Mild peribronchial thickening without focal air space disease.
 2.  Cardiomegaly.

## 2005-07-11 ENCOUNTER — Ambulatory Visit: Payer: Self-pay | Admitting: Gastroenterology

## 2005-08-21 ENCOUNTER — Ambulatory Visit: Payer: Self-pay | Admitting: Endocrinology

## 2005-09-03 ENCOUNTER — Ambulatory Visit: Payer: Self-pay | Admitting: Endocrinology

## 2005-09-06 ENCOUNTER — Ambulatory Visit: Payer: Self-pay | Admitting: Gastroenterology

## 2005-09-16 ENCOUNTER — Ambulatory Visit: Payer: Self-pay | Admitting: Gastroenterology

## 2005-10-08 ENCOUNTER — Ambulatory Visit: Payer: Self-pay | Admitting: Endocrinology

## 2005-11-07 ENCOUNTER — Ambulatory Visit: Payer: Self-pay | Admitting: Endocrinology

## 2005-11-25 ENCOUNTER — Ambulatory Visit: Payer: Self-pay | Admitting: Internal Medicine

## 2005-12-09 ENCOUNTER — Ambulatory Visit: Payer: Self-pay | Admitting: Endocrinology

## 2006-01-22 ENCOUNTER — Ambulatory Visit: Payer: Self-pay | Admitting: Endocrinology

## 2006-02-12 ENCOUNTER — Ambulatory Visit: Payer: Self-pay | Admitting: Endocrinology

## 2006-02-12 LAB — CONVERTED CEMR LAB
Iron: 67 ug/dL (ref 42–145)
RBC: 4.66 M/uL (ref 3.87–5.11)
RDW: 13 % (ref 11.5–14.6)
Saturation Ratios: 23.3 % (ref 20.0–50.0)
Transferrin: 205 mg/dL — ABNORMAL LOW (ref >=212.0)
Vitamin B-12: 592 pg/mL (ref 211–911)
WBC: 5.6 10*3/uL (ref 4.5–10.5)

## 2006-03-19 ENCOUNTER — Ambulatory Visit: Payer: Self-pay | Admitting: Endocrinology

## 2006-04-10 ENCOUNTER — Ambulatory Visit: Payer: Self-pay | Admitting: Endocrinology

## 2006-04-21 IMAGING — CR DG ESOPHAGUS
8 of 13 series · 14 of 24 positions shown · non-contrast
Comparison: none

[run (1 of 7)]
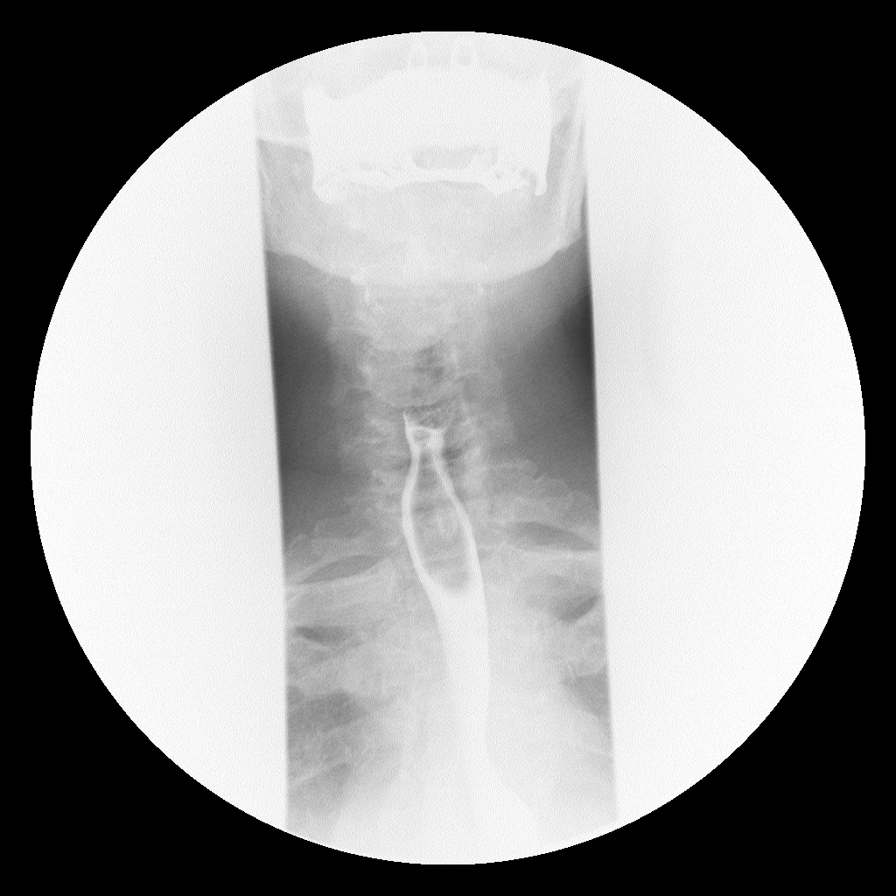

[run (2 of 7)]
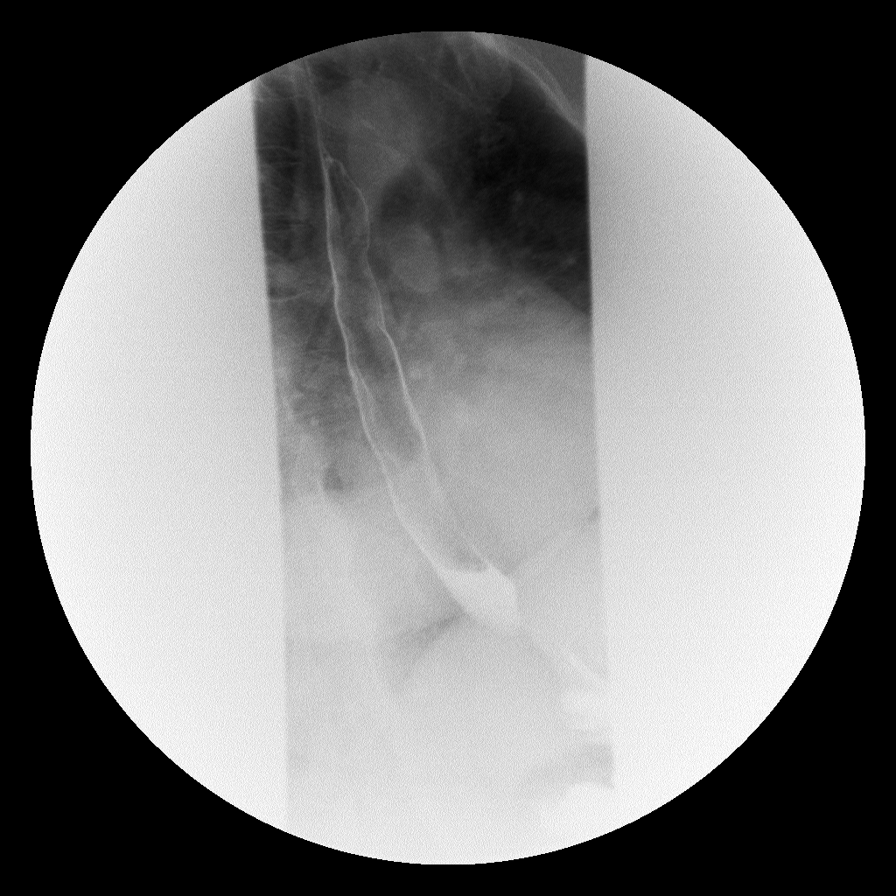

[run (3 of 7)]
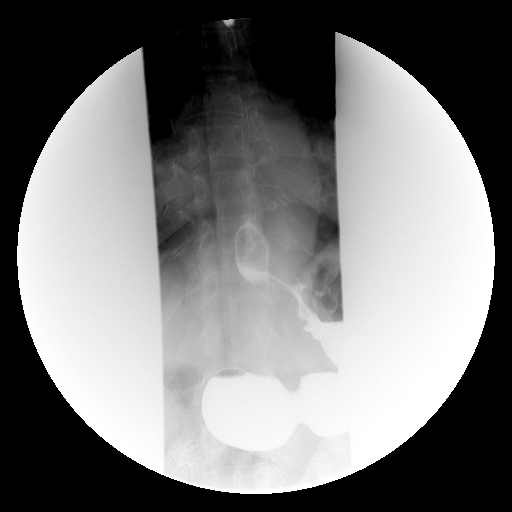

[run (4 of 7)]
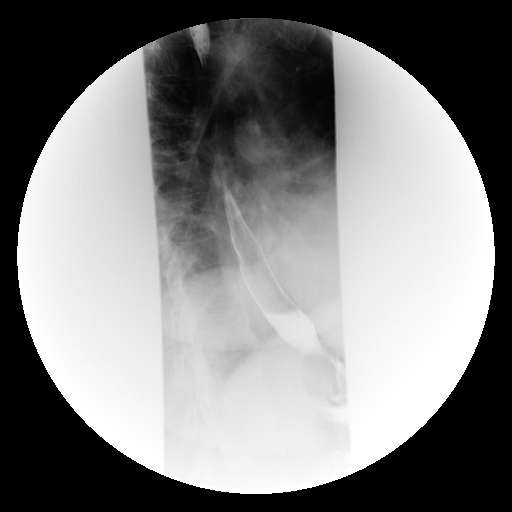

[Series 8: run · 4 of 15 slices shown (5 of 7)]
[im 1/15]
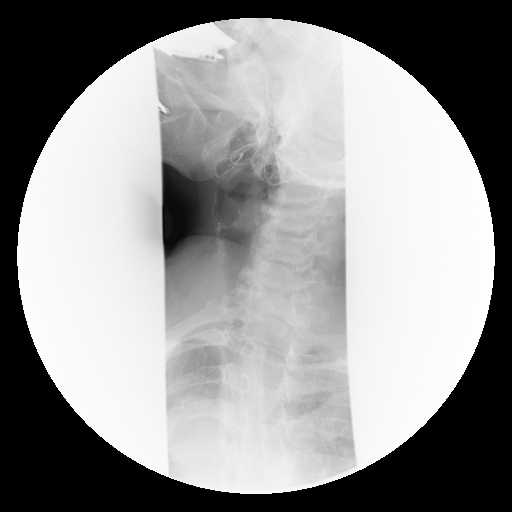
[im 5/15]
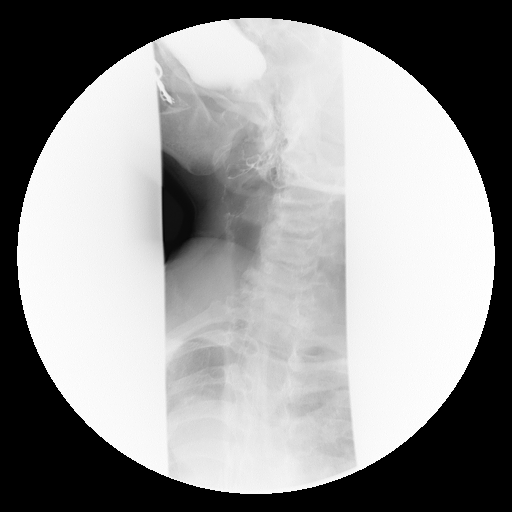
[im 10/15]
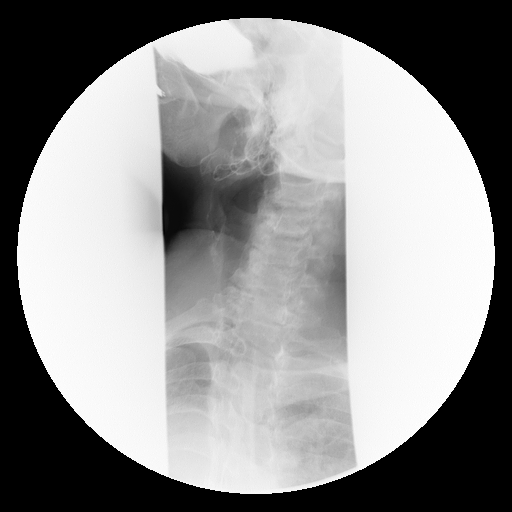
[im 12/15]
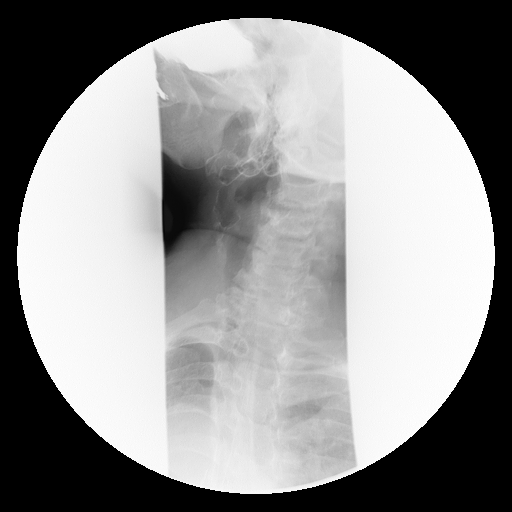

[Series 9: run · 4 of 12 slices shown (6 of 7)]
[im 1/12]
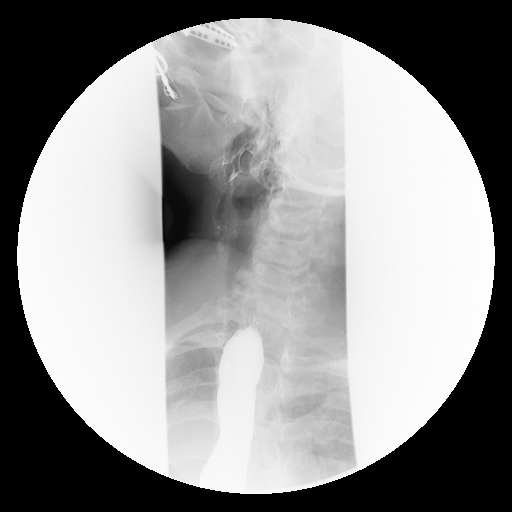
[im 5/12]
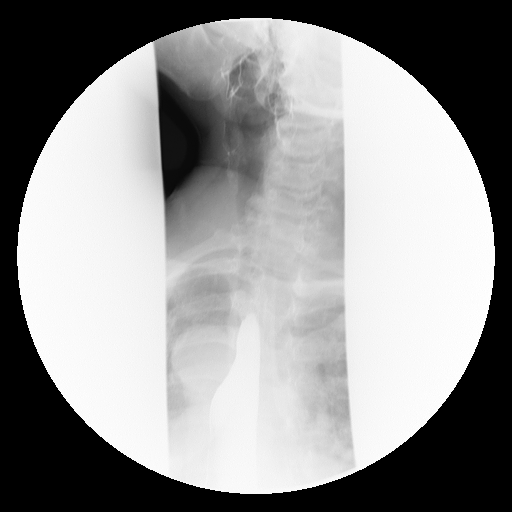
[im 9/12]
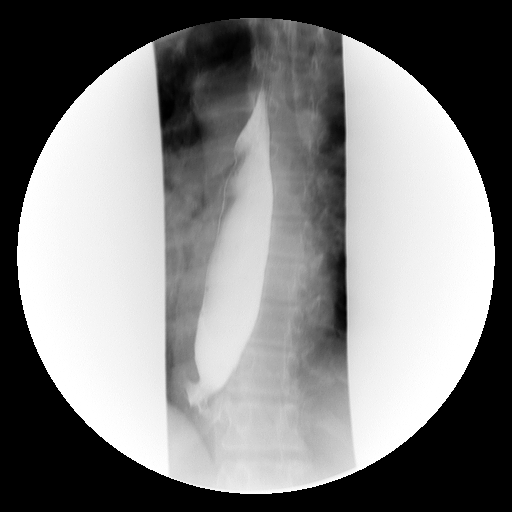
[im 12/12]
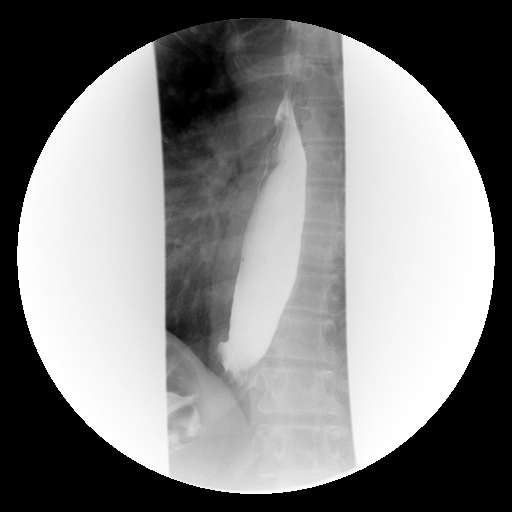

[run (7 of 7)]
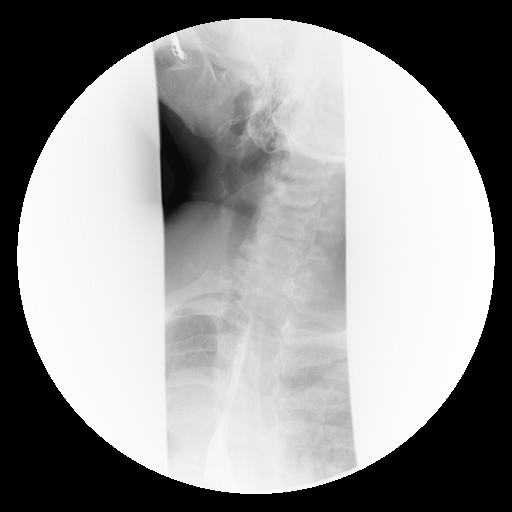

[view not recorded]
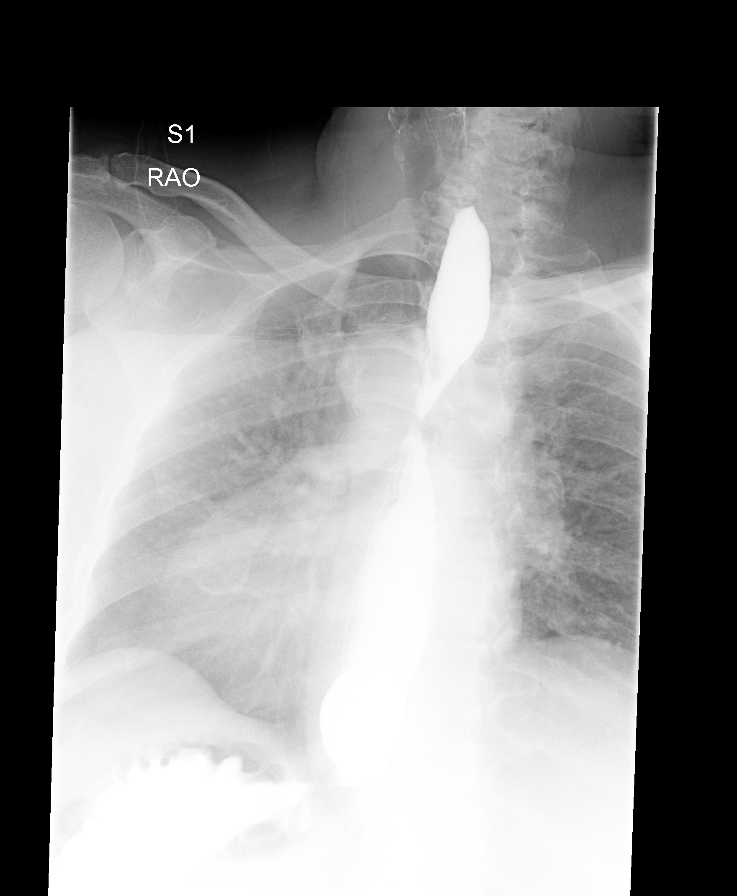

[14 of 24 positions shown; findings below may reference images not displayed]

<div class=Wection7><p class=MsoNormal>Clinical Data:<span style="mso-spacerun: yes">? </span>Dysphagia.<span style="mso-spacerun: yes">? </span>Pain in throat area.<span style="mso-spacerun: yes">? </span>Difficulty swallowing.<span style="mso-spacerun: yes">? </span>
 <p class=MsoNormal>ESOPHAGEAL STUDY:
 <p class=MsoNormal>Findings:<span style="mso-spacerun: yes">? </span>With the aid of fluoroscopic visualization and rapid sequence spot filming, barium was seen to pass freely through the esophagus without evidence of obstruction or mass.<span style="mso-spacerun: yes">? </span>The patient easily swallowed a 3 x 13 mm barium tablet.<span style="mso-spacerun: yes">? </span>There was some prominence of the esophageal ampulla, but no definite hiatal hernia or gastroesophageal reflux was seen.<span style="mso-spacerun: yes">? </span>
 <p class=MsoNormal>IMPRESSION:
 <p class=MsoNormal>No definite esophageal abnormality is identified.<span style="mso-spacerun: yes">? </span>The patient was able to swallow a 3 x 13 mm barium tablet without difficulty.<span style="mso-spacerun: yes">? </span>Slightly prominent esophageal ampulla, but no definite hiatal hernia or reflux.
 </div>

## 2006-05-15 ENCOUNTER — Ambulatory Visit: Payer: Self-pay | Admitting: Endocrinology

## 2006-06-09 ENCOUNTER — Ambulatory Visit: Payer: Self-pay | Admitting: Endocrinology

## 2006-06-10 LAB — CONVERTED CEMR LAB
AST: 18 units/L (ref 0–37)
Albumin: 3.4 g/dL — ABNORMAL LOW (ref 3.5–5.2)
Alkaline Phosphatase: 99 units/L (ref 39–117)
Amylase: 32 units/L (ref 27–131)
Basophils Absolute: 0 10*3/uL (ref 0.0–0.1)
Basophils Relative: 0.4 % (ref 0.0–1.0)
MCHC: 32.1 g/dL (ref 30.0–36.0)
Monocytes Absolute: 0.2 10*3/uL (ref 0.2–0.7)
Monocytes Relative: 3.1 % (ref 3.0–11.0)
Platelets: 271 10*3/uL (ref 150–400)
RBC: 4.23 M/uL (ref 3.87–5.11)
RDW: 13.6 % (ref 11.5–14.6)

## 2006-06-13 ENCOUNTER — Ambulatory Visit: Payer: Self-pay | Admitting: Gastroenterology

## 2006-06-25 ENCOUNTER — Ambulatory Visit: Payer: Self-pay | Admitting: Gastroenterology

## 2006-06-25 ENCOUNTER — Encounter: Payer: Self-pay | Admitting: Gastroenterology

## 2006-06-25 HISTORY — PX: ESOPHAGOGASTRODUODENOSCOPY: SHX1529

## 2006-07-16 ENCOUNTER — Encounter: Admission: RE | Admit: 2006-07-16 | Discharge: 2006-07-16 | Payer: Self-pay | Admitting: Obstetrics and Gynecology

## 2006-08-09 ENCOUNTER — Encounter: Payer: Self-pay | Admitting: Endocrinology

## 2006-08-09 DIAGNOSIS — J449 Chronic obstructive pulmonary disease, unspecified: Secondary | ICD-10-CM | POA: Insufficient documentation

## 2006-08-09 DIAGNOSIS — K219 Gastro-esophageal reflux disease without esophagitis: Secondary | ICD-10-CM | POA: Insufficient documentation

## 2006-08-09 DIAGNOSIS — M1990 Unspecified osteoarthritis, unspecified site: Secondary | ICD-10-CM

## 2006-08-09 DIAGNOSIS — F329 Major depressive disorder, single episode, unspecified: Secondary | ICD-10-CM

## 2006-08-09 DIAGNOSIS — I10 Essential (primary) hypertension: Secondary | ICD-10-CM | POA: Insufficient documentation

## 2006-08-09 DIAGNOSIS — M810 Age-related osteoporosis without current pathological fracture: Secondary | ICD-10-CM | POA: Insufficient documentation

## 2006-08-09 DIAGNOSIS — J309 Allergic rhinitis, unspecified: Secondary | ICD-10-CM | POA: Insufficient documentation

## 2006-08-09 HISTORY — DX: Unspecified osteoarthritis, unspecified site: M19.90

## 2006-09-01 ENCOUNTER — Ambulatory Visit: Payer: Self-pay | Admitting: Endocrinology

## 2006-09-01 LAB — CONVERTED CEMR LAB
AST: 17 units/L (ref 0–37)
Albumin: 3.5 g/dL (ref 3.5–5.2)
Alkaline Phosphatase: 148 units/L — ABNORMAL HIGH (ref 39–117)
BUN: 10 mg/dL (ref 6–23)
Basophils Absolute: 0 10*3/uL (ref 0.0–0.1)
Basophils Relative: 0.5 % (ref 0.0–1.0)
CO2: 29 meq/L (ref 19–32)
Chloride: 100 meq/L (ref 96–112)
Creatinine, Ser: 0.9 mg/dL (ref 0.4–1.2)
Direct LDL: 205.6 mg/dL
HCT: 35.6 % — ABNORMAL LOW (ref 36.0–46.0)
MCHC: 32.4 g/dL (ref 30.0–36.0)
Monocytes Relative: 6.8 % (ref 3.0–11.0)
Neutrophils Relative %: 54.4 % (ref 43.0–77.0)
RBC: 4.88 M/uL (ref 3.87–5.11)
RDW: 13.4 % (ref 11.5–14.6)
Total Bilirubin: 0.6 mg/dL (ref 0.3–1.2)
Total CHOL/HDL Ratio: 6.6
Triglycerides: 210 mg/dL (ref 0–149)
VLDL: 42 mg/dL — ABNORMAL HIGH (ref 0–40)

## 2006-09-04 ENCOUNTER — Encounter: Admission: RE | Admit: 2006-09-04 | Discharge: 2006-09-04 | Payer: Self-pay | Admitting: Endocrinology

## 2006-09-14 IMAGING — CT CT ABDOMEN W/ CM
1 of 2 series · 13 of 32 positions shown, 18 images · IV contrast ([ID] READICAT & 150ml omni/300%)
Comparison: none

CLINICAL DATA: Mid to lower abdominal pain, especially in the left lower quadrant for 2 weeks, with nausea and vomiting, diarrhea, and constipation.  History of total abdominal hysterectomy.
ABDOMEN CT WITH CONTRAST:
TECHNIQUE: Multidetector CT imaging of the abdomen was performed following the standard protocol during bolus administration of intravenous contrast.
Contrast:    150 cc Omnipaque 300 and oral contrast.
TECHNIQUE: Multidetector CT imaging of the pelvis was performed following the standard protocol during bolus administration of intravenous contrast.

[Series 2: abd pelvis · axial · 0.84mm/px · z∈[-460,-106]mm · 13 of 81 slices shown, 18 images]
[im 5/81  soft-tissue]
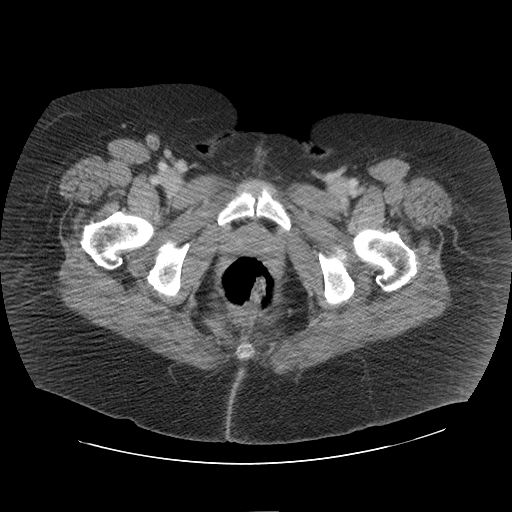
[im 5/81  bone]
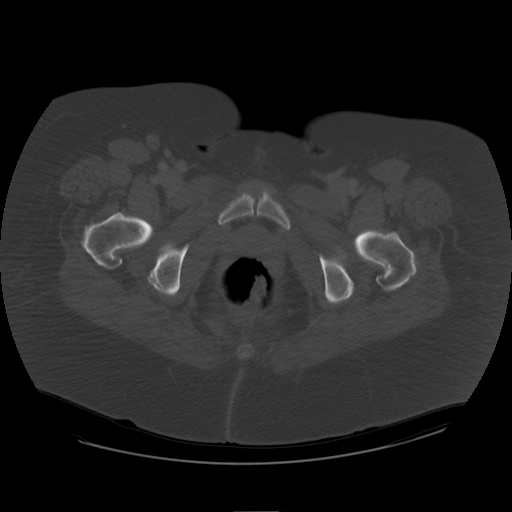
[im 14/81  soft-tissue]
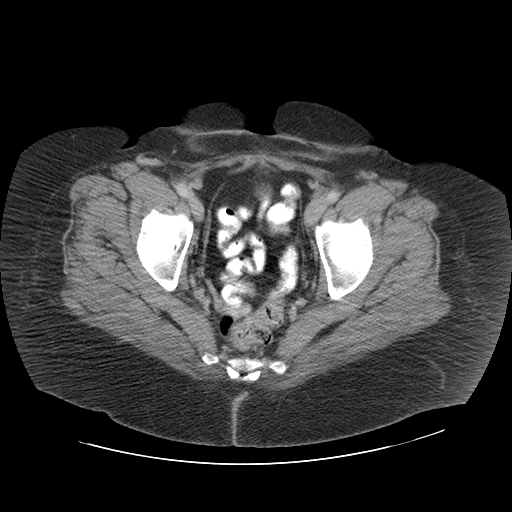
[im 18/81  soft-tissue]
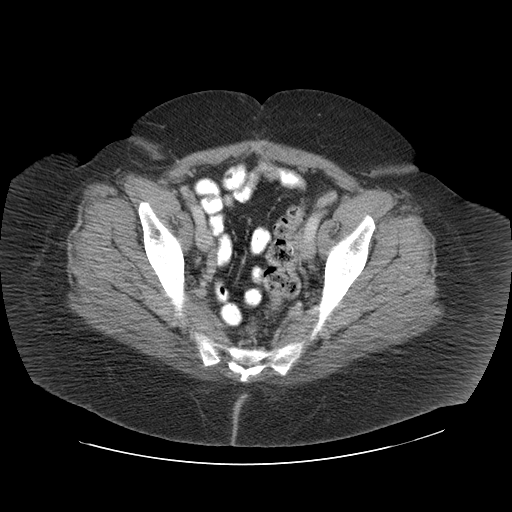
[im 23/81  soft-tissue]
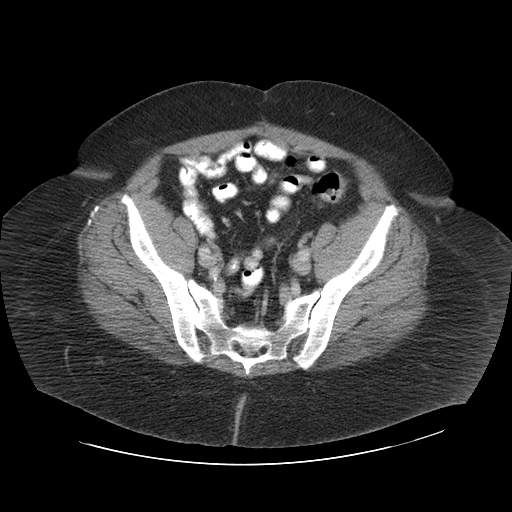
[im 32/81  soft-tissue]
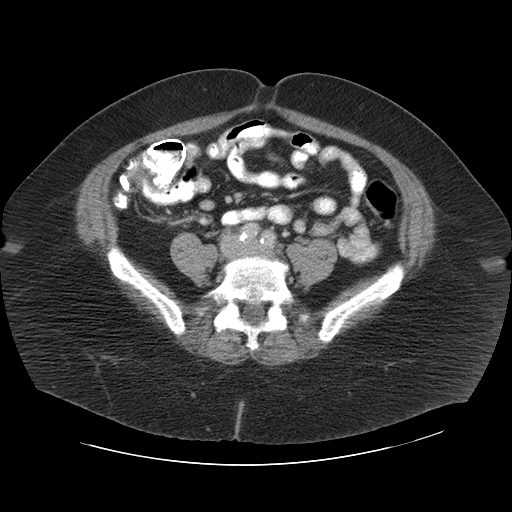
[im 36/81  soft-tissue]
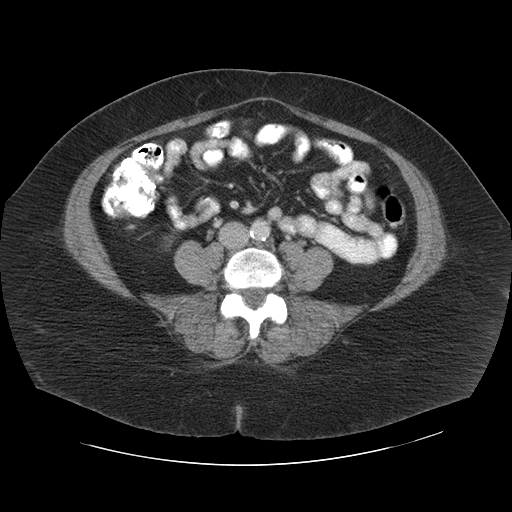
[im 45/81  soft-tissue]
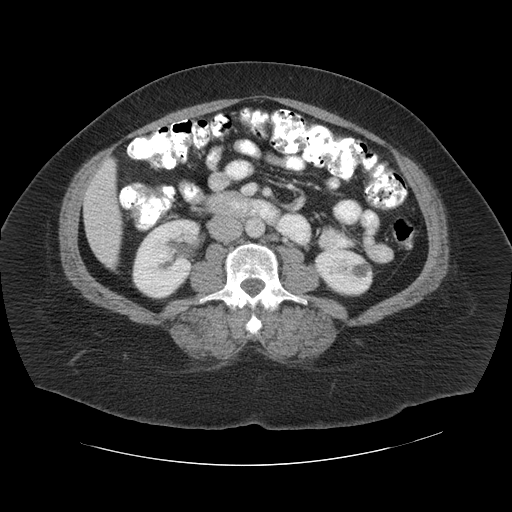
[im 49/81  soft-tissue]
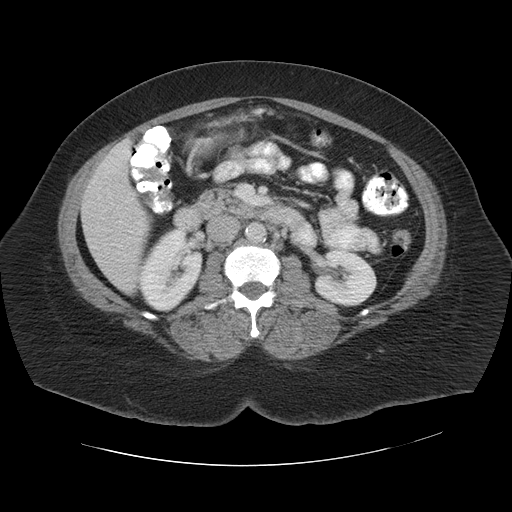
[im 58/81  soft-tissue]
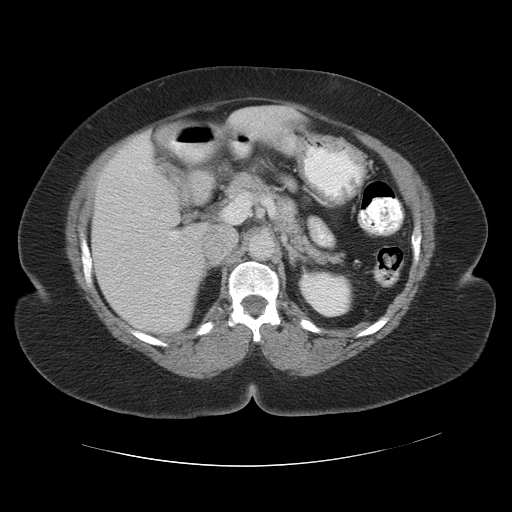
[im 58/81  bone]
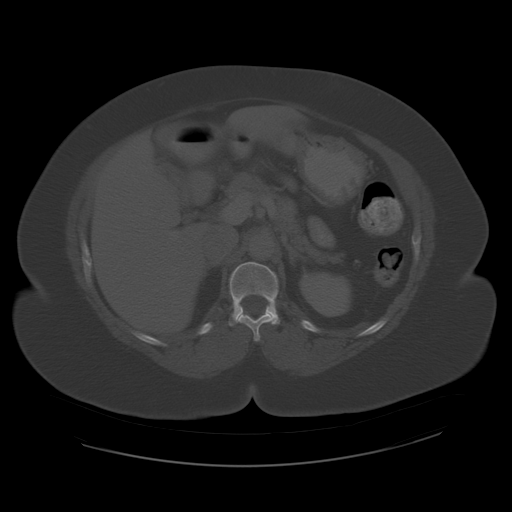
[im 63/81  soft-tissue]
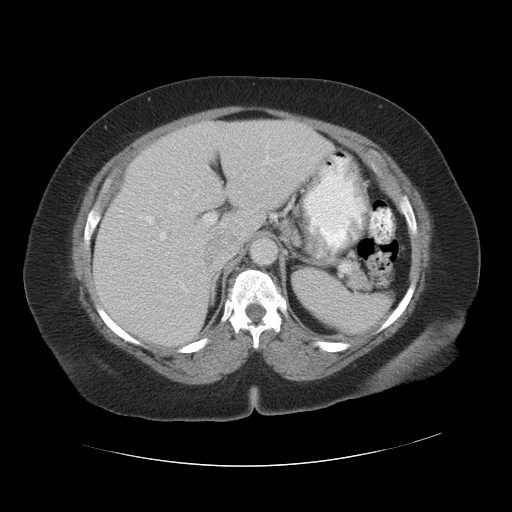
[im 63/81  lung]
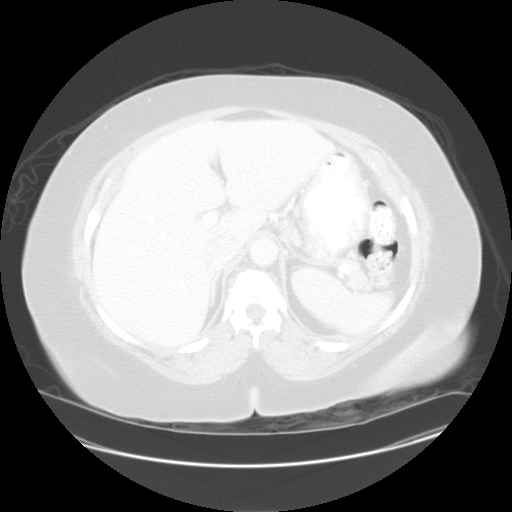
[im 67/81  soft-tissue]
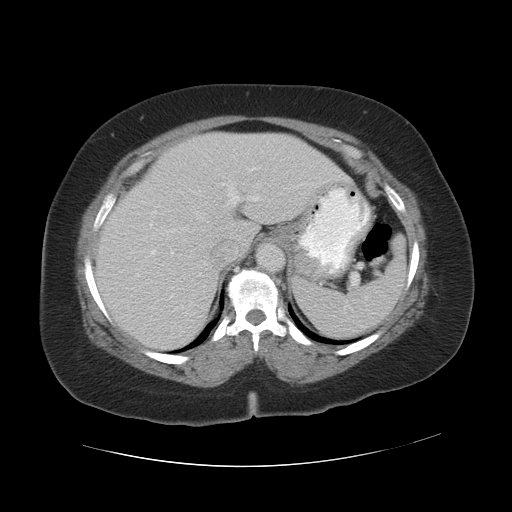
[im 67/81  lung]
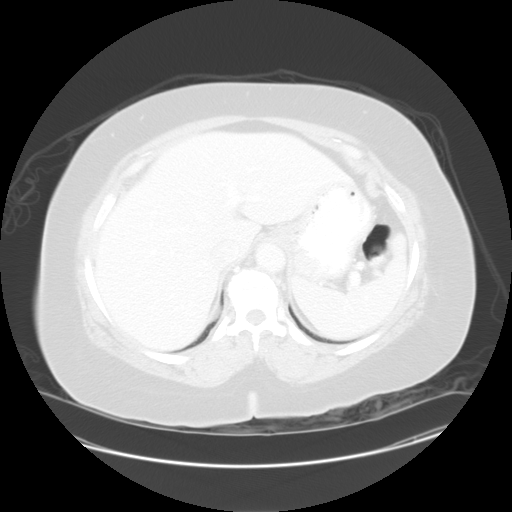
[im 72/81  lung]
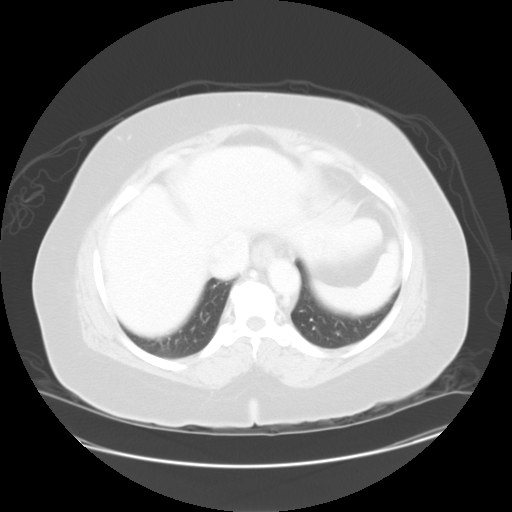
[im 76/81  soft-tissue]
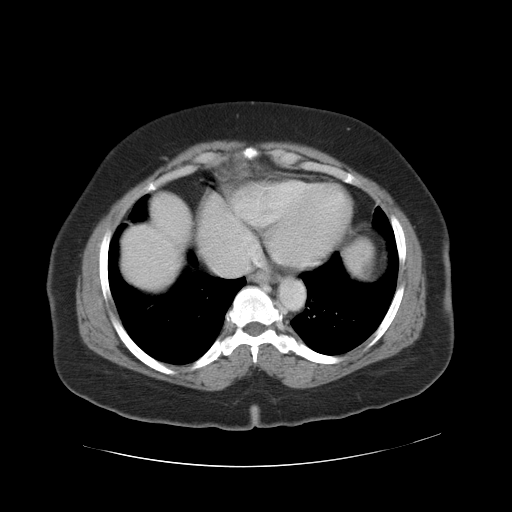
[im 76/81  lung]
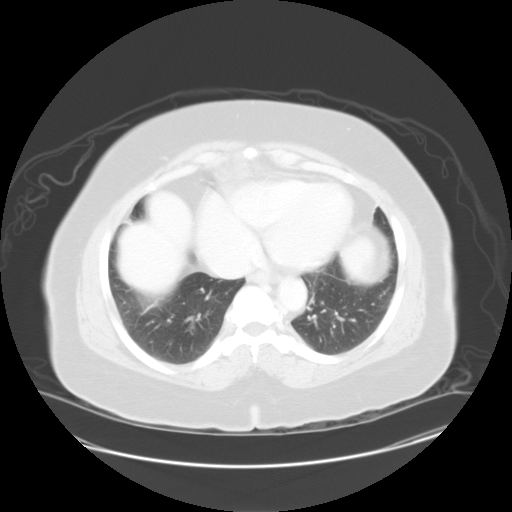

[13 of 32 positions shown; findings below may reference images not displayed]

FINDINGS: The lung bases appear clear.  The liver, spleen, adrenal glands, pancreas, and gallbladder all have a normal appearance.  There is a small simple cyst identified in the lower pole of the left kidney measuring approximately 1 cm in size and a smaller subcentimeter low density area in the upper pole of the left kidney which likely represents a small renal cyst with some volume averaging.  The kidneys are otherwise unremarkable.  Delayed images through the kidneys demonstrate a normal collecting system with symmetric excretion.  No abnormal adenopathy or abdominal fluid is seen.  The proximal bowel was well filled and has a normal appearance.  Bony structures demonstrate some mild degenerative changes of the lower thoracic and upper lumbar portions of the spine.
IMPRESSION: Small left renal cysts.  Otherwise unremarkable.
PELVIS CT WITH CONTRAST:
FINDINGS: Good distal bowel contrast was achieved.  There are several small sigmoid diverticula identified.  No evidence for pericolonic stranding to suggest associated inflammatory change or signs of diverticulitis are noted.  No other focal bowel abnormalities are seen.  No pelvic fluid is noted and no abnormal adenopathy is seen.  Degenerative changes of the lower lumbar spine are seen.  Mild calcification of the distal aorta and iliac vessels are apparent.  No inguinal adenopathy is noted.
IMPRESSION: Findings compatible with sigmoid diverticulosis with no signs of current diverticulitis or other pelvic inflammatory changes.  Mild degenerative bony changes.  Otherwise unremarkable pelvic CT.

## 2006-09-15 ENCOUNTER — Ambulatory Visit: Payer: Self-pay | Admitting: Endocrinology

## 2006-10-20 ENCOUNTER — Emergency Department (HOSPITAL_COMMUNITY): Admission: EM | Admit: 2006-10-20 | Discharge: 2006-10-21 | Payer: Self-pay | Admitting: Emergency Medicine

## 2006-10-24 ENCOUNTER — Ambulatory Visit: Payer: Self-pay | Admitting: Endocrinology

## 2006-10-31 ENCOUNTER — Ambulatory Visit: Payer: Self-pay | Admitting: Endocrinology

## 2006-11-11 ENCOUNTER — Ambulatory Visit: Payer: Self-pay | Admitting: Endocrinology

## 2006-12-26 ENCOUNTER — Telehealth: Payer: Self-pay | Admitting: Internal Medicine

## 2007-01-08 ENCOUNTER — Encounter: Payer: Self-pay | Admitting: Endocrinology

## 2007-01-09 ENCOUNTER — Ambulatory Visit: Payer: Self-pay | Admitting: Endocrinology

## 2007-01-26 ENCOUNTER — Ambulatory Visit: Payer: Self-pay | Admitting: Endocrinology

## 2007-01-26 DIAGNOSIS — R51 Headache: Secondary | ICD-10-CM | POA: Insufficient documentation

## 2007-01-26 DIAGNOSIS — R519 Headache, unspecified: Secondary | ICD-10-CM | POA: Insufficient documentation

## 2007-02-02 ENCOUNTER — Telehealth (INDEPENDENT_AMBULATORY_CARE_PROVIDER_SITE_OTHER): Payer: Self-pay | Admitting: *Deleted

## 2007-02-18 ENCOUNTER — Telehealth: Payer: Self-pay | Admitting: Endocrinology

## 2007-02-22 ENCOUNTER — Encounter: Payer: Self-pay | Admitting: Endocrinology

## 2007-03-26 ENCOUNTER — Encounter: Payer: Self-pay | Admitting: Endocrinology

## 2007-04-22 ENCOUNTER — Ambulatory Visit: Payer: Self-pay | Admitting: Gastroenterology

## 2007-04-24 ENCOUNTER — Ambulatory Visit (HOSPITAL_COMMUNITY): Admission: RE | Admit: 2007-04-24 | Discharge: 2007-04-24 | Payer: Self-pay | Admitting: Gastroenterology

## 2007-04-27 ENCOUNTER — Ambulatory Visit: Payer: Self-pay | Admitting: Endocrinology

## 2007-04-28 ENCOUNTER — Ambulatory Visit: Payer: Self-pay | Admitting: Gastroenterology

## 2007-04-28 ENCOUNTER — Encounter: Payer: Self-pay | Admitting: Gastroenterology

## 2007-05-04 ENCOUNTER — Ambulatory Visit: Payer: Self-pay

## 2007-05-04 ENCOUNTER — Encounter: Payer: Self-pay | Admitting: Endocrinology

## 2007-05-04 ENCOUNTER — Encounter: Payer: Self-pay | Admitting: Internal Medicine

## 2007-05-07 ENCOUNTER — Encounter: Payer: Self-pay | Admitting: Endocrinology

## 2007-05-11 ENCOUNTER — Encounter: Payer: Self-pay | Admitting: Endocrinology

## 2007-05-13 ENCOUNTER — Telehealth (INDEPENDENT_AMBULATORY_CARE_PROVIDER_SITE_OTHER): Payer: Self-pay | Admitting: *Deleted

## 2007-05-20 ENCOUNTER — Ambulatory Visit: Payer: Self-pay | Admitting: Endocrinology

## 2007-07-06 ENCOUNTER — Ambulatory Visit: Payer: Self-pay | Admitting: Endocrinology

## 2007-07-06 DIAGNOSIS — R21 Rash and other nonspecific skin eruption: Secondary | ICD-10-CM | POA: Insufficient documentation

## 2007-07-10 ENCOUNTER — Ambulatory Visit (HOSPITAL_COMMUNITY): Admission: RE | Admit: 2007-07-10 | Discharge: 2007-07-10 | Payer: Self-pay | Admitting: Endocrinology

## 2007-07-20 ENCOUNTER — Encounter: Payer: Self-pay | Admitting: Endocrinology

## 2007-07-21 ENCOUNTER — Ambulatory Visit: Payer: Self-pay | Admitting: Internal Medicine

## 2007-07-28 ENCOUNTER — Ambulatory Visit: Payer: Self-pay | Admitting: Gastroenterology

## 2007-07-28 DIAGNOSIS — R1013 Epigastric pain: Secondary | ICD-10-CM

## 2007-07-28 DIAGNOSIS — K3189 Other diseases of stomach and duodenum: Secondary | ICD-10-CM | POA: Insufficient documentation

## 2007-07-28 DIAGNOSIS — R1310 Dysphagia, unspecified: Secondary | ICD-10-CM | POA: Insufficient documentation

## 2007-07-29 ENCOUNTER — Telehealth: Payer: Self-pay | Admitting: Gastroenterology

## 2007-07-30 ENCOUNTER — Ambulatory Visit (HOSPITAL_COMMUNITY): Admission: RE | Admit: 2007-07-30 | Discharge: 2007-07-30 | Payer: Self-pay | Admitting: Gastroenterology

## 2007-08-06 ENCOUNTER — Encounter: Payer: Self-pay | Admitting: Endocrinology

## 2007-08-06 ENCOUNTER — Telehealth: Payer: Self-pay | Admitting: Endocrinology

## 2007-08-21 ENCOUNTER — Ambulatory Visit: Payer: Self-pay | Admitting: Gastroenterology

## 2007-08-21 DIAGNOSIS — K3184 Gastroparesis: Secondary | ICD-10-CM | POA: Insufficient documentation

## 2007-08-21 HISTORY — DX: Gastroparesis: K31.84

## 2007-08-24 ENCOUNTER — Encounter: Payer: Self-pay | Admitting: Endocrinology

## 2007-09-03 ENCOUNTER — Ambulatory Visit: Payer: Self-pay | Admitting: Endocrinology

## 2007-09-17 ENCOUNTER — Telehealth (INDEPENDENT_AMBULATORY_CARE_PROVIDER_SITE_OTHER): Payer: Self-pay | Admitting: *Deleted

## 2007-09-21 ENCOUNTER — Telehealth (INDEPENDENT_AMBULATORY_CARE_PROVIDER_SITE_OTHER): Payer: Self-pay | Admitting: *Deleted

## 2007-09-21 ENCOUNTER — Encounter: Payer: Self-pay | Admitting: Gastroenterology

## 2007-09-23 ENCOUNTER — Ambulatory Visit: Payer: Self-pay | Admitting: Endocrinology

## 2007-09-23 DIAGNOSIS — G5790 Unspecified mononeuropathy of unspecified lower limb: Secondary | ICD-10-CM

## 2007-09-23 HISTORY — DX: Unspecified mononeuropathy of unspecified lower limb: G57.90

## 2007-09-24 ENCOUNTER — Encounter: Payer: Self-pay | Admitting: Endocrinology

## 2007-09-30 ENCOUNTER — Telehealth: Payer: Self-pay | Admitting: Endocrinology

## 2007-10-05 ENCOUNTER — Ambulatory Visit: Payer: Self-pay | Admitting: Endocrinology

## 2007-11-05 ENCOUNTER — Encounter: Payer: Self-pay | Admitting: Gastroenterology

## 2007-11-05 ENCOUNTER — Telehealth: Payer: Self-pay | Admitting: Endocrinology

## 2007-11-09 ENCOUNTER — Ambulatory Visit: Payer: Self-pay | Admitting: Endocrinology

## 2007-11-09 DIAGNOSIS — D649 Anemia, unspecified: Secondary | ICD-10-CM

## 2007-11-16 LAB — CONVERTED CEMR LAB
BUN: 12 mg/dL (ref 6–23)
Basophils Relative: 0.3 % (ref 0.0–3.0)
Chloride: 100 meq/L (ref 96–112)
Eosinophils Relative: 0.9 % (ref 0.0–5.0)
GFR calc non Af Amer: 67 mL/min
Glucose, Bld: 213 mg/dL — ABNORMAL HIGH (ref 70–99)
Hemoglobin: 11.5 g/dL — ABNORMAL LOW (ref 12.0–15.0)
Lymphocytes Relative: 30.7 % (ref 12.0–46.0)
Monocytes Relative: 4.5 % (ref 3.0–12.0)
Neutro Abs: 3.9 10*3/uL (ref 1.4–7.7)
Potassium: 4.1 meq/L (ref 3.5–5.1)
RBC: 4.65 M/uL (ref 3.87–5.11)
TSH: 1.27 microintl units/mL (ref 0.35–5.50)
Vitamin B-12: 449 pg/mL (ref 211–911)
WBC: 6.2 10*3/uL (ref 4.5–10.5)

## 2007-11-17 ENCOUNTER — Encounter: Payer: Self-pay | Admitting: Endocrinology

## 2007-11-18 ENCOUNTER — Telehealth: Payer: Self-pay | Admitting: Endocrinology

## 2007-11-29 IMAGING — MR MR LUMBAR SPINE WO/W CM
4 of 8 series · 19 of 48 positions shown · IV contrast (20cc magnevist)
Comparison: none

CLINICAL DATA: Left body pain worse in the left hip and groin radiating to the leg.   Some pain in the right hip. 
 MRI LUMBAR SPINE WITHOUT AND WITH CONTRAST:
TECHNIQUE: Multiplanar and multiecho pulse sequences of the lumbar spine, to include the lower thoracic region and upper sacral regions, were obtained according to standard protocol before and after administration of intravenous contrast.
 Contrast:  20 cc Magnevist.

[Series 3: T2 · sagittal · 4.0mm · 0.55mm/px · 5 of 12 slices shown (1 of 2)]
[im 1/12]
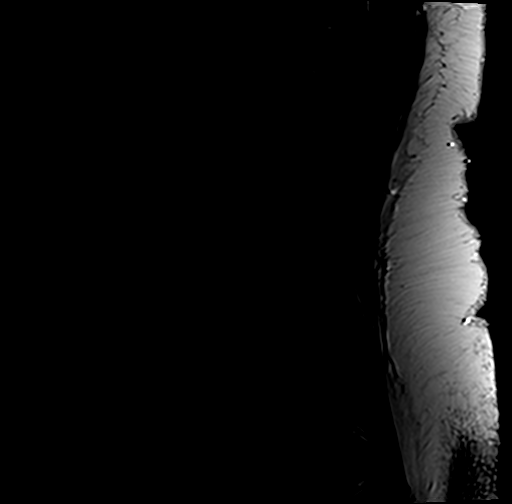
[im 3/12]
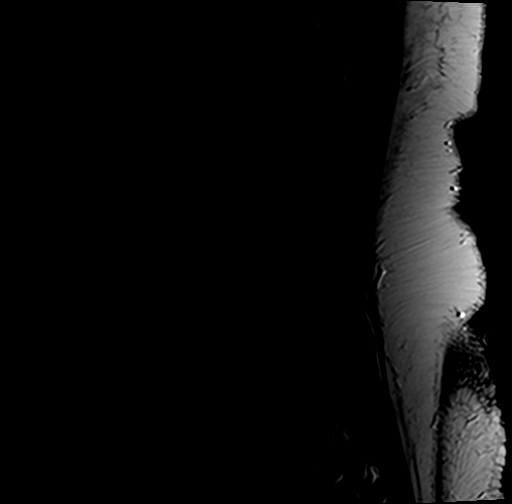
[im 6/12]
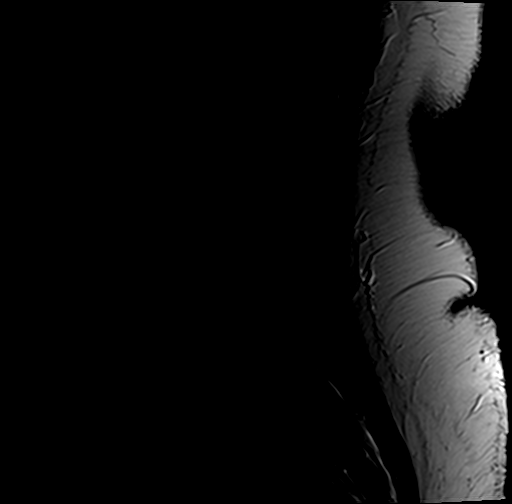
[im 9/12]
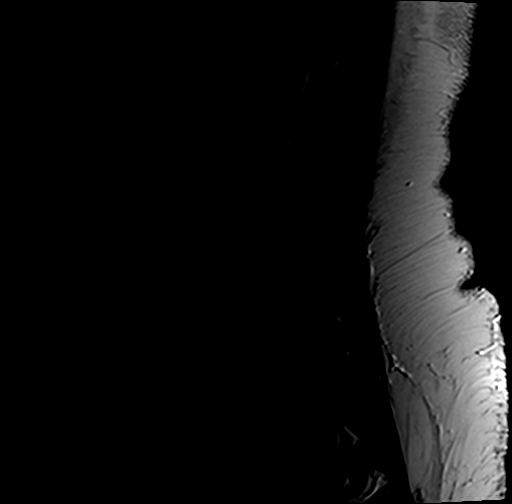
[im 12/12]
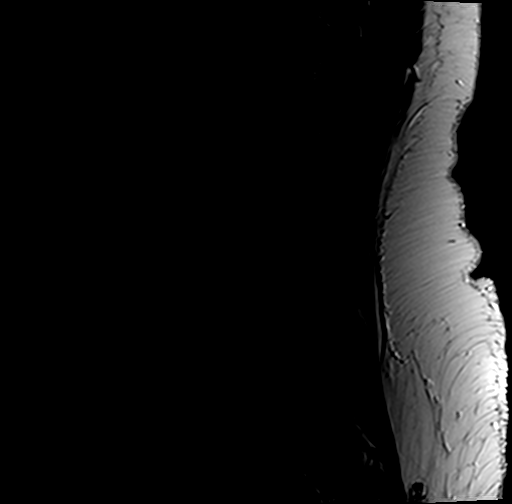

[Series 6: T1 · sagittal · 4.0mm · 0.55mm/px · 3 of 12 slices shown (1 of 2)]
[im 1/12]
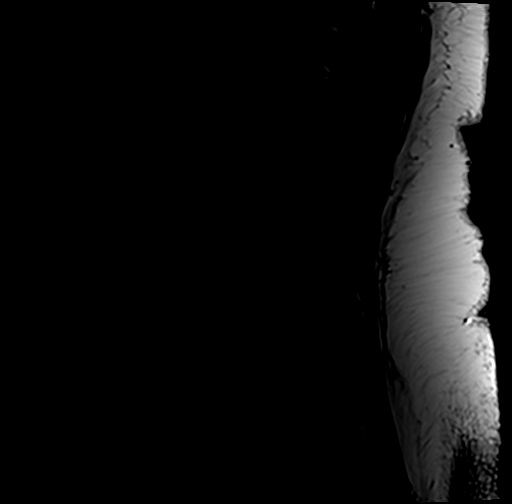
[im 8/12]
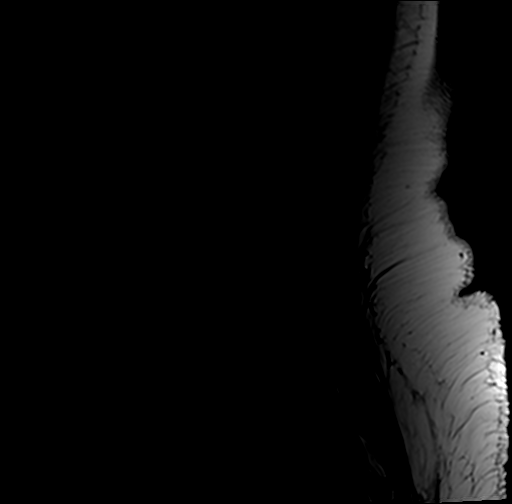
[im 12/12]
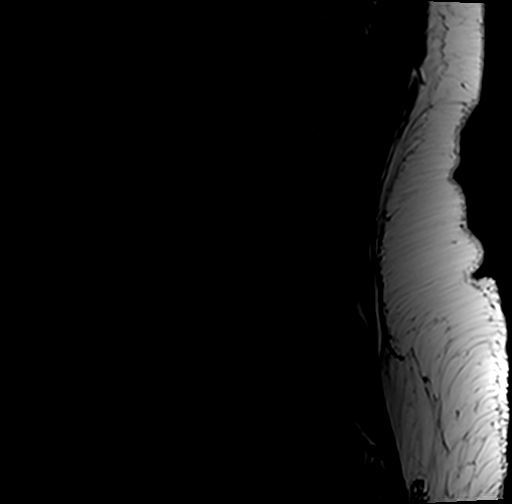

[Series 7: T1 · axial · 4.0mm · 0.43mm/px · z∈[-63,+89]mm · 3 of 27 slices shown (2 of 2)]
[im 4/27]
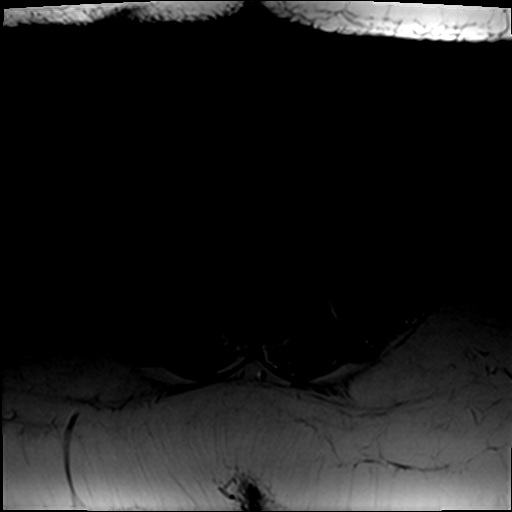
[im 14/27]
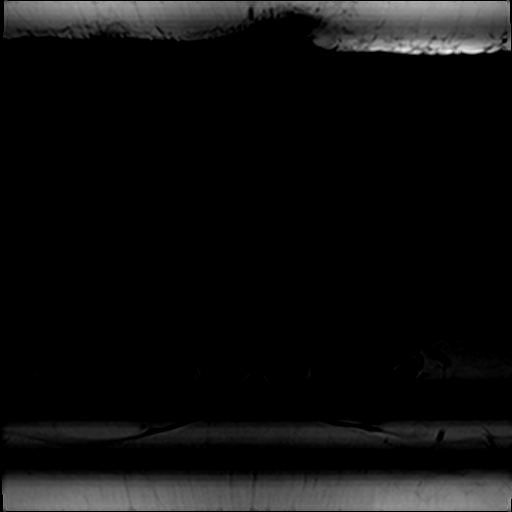
[im 23/27]
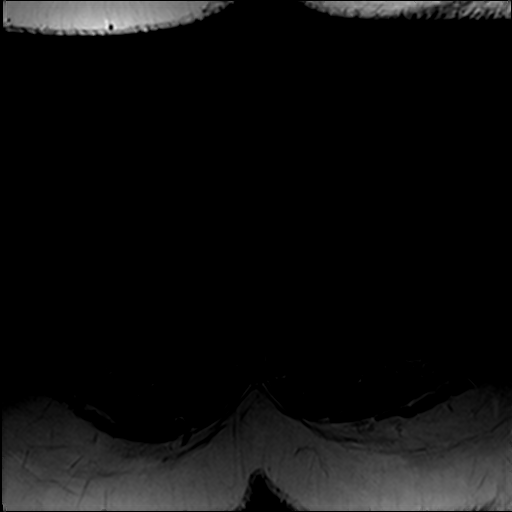

[Series 8: T2 · axial · 4.0mm · 0.43mm/px · z∈[-77,+89]mm · 8 of 27 slices shown (2 of 2)]
[im 1/27]
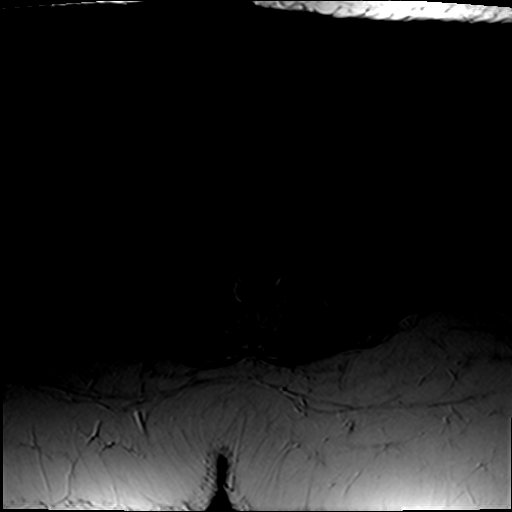
[im 4/27]
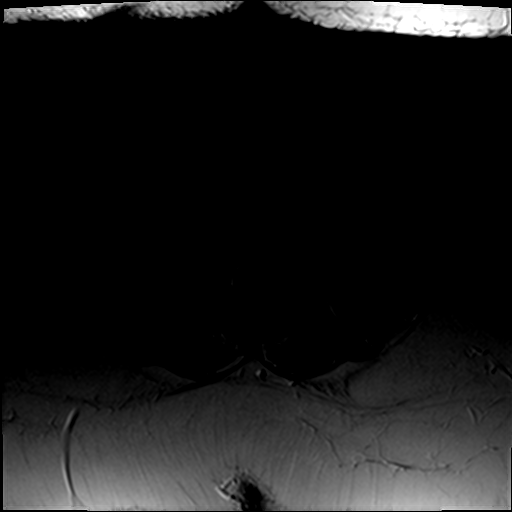
[im 7/27]
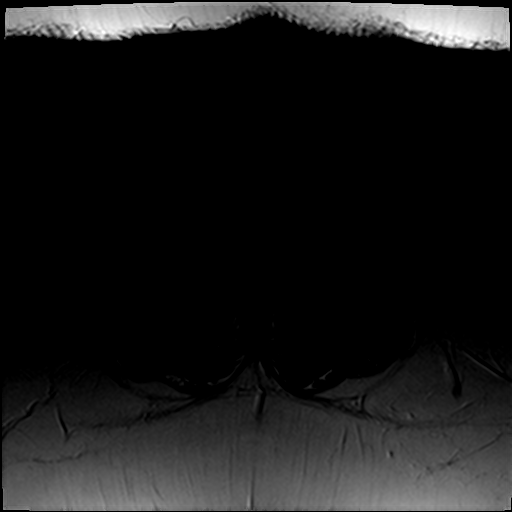
[im 10/27]
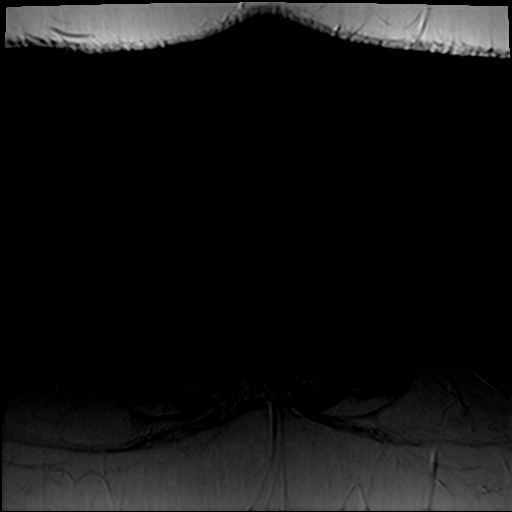
[im 14/27]
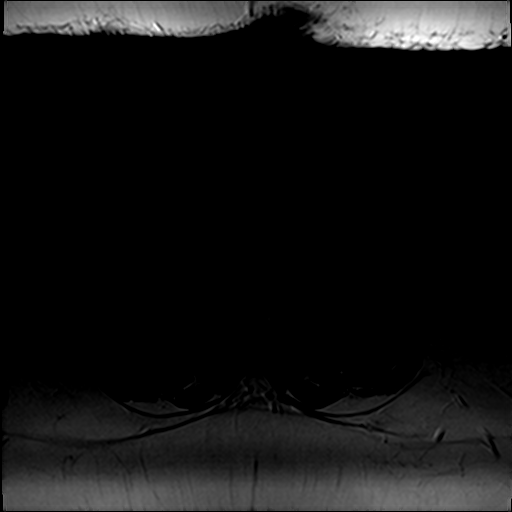
[im 17/27]
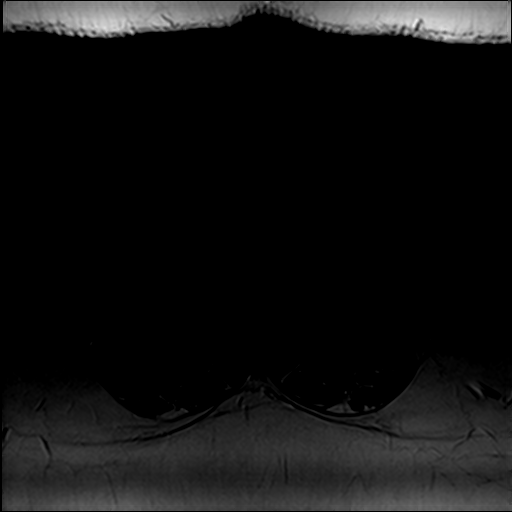
[im 20/27]
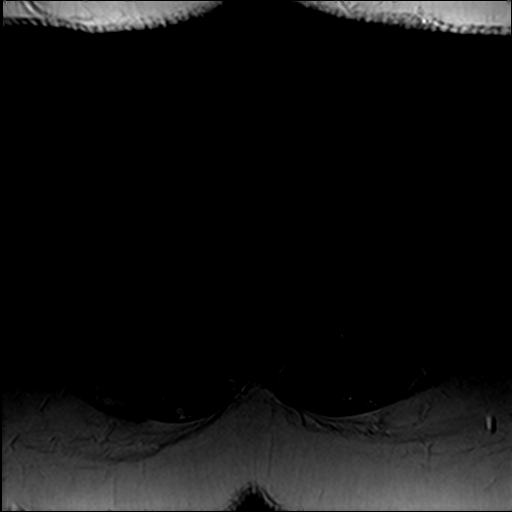
[im 23/27]
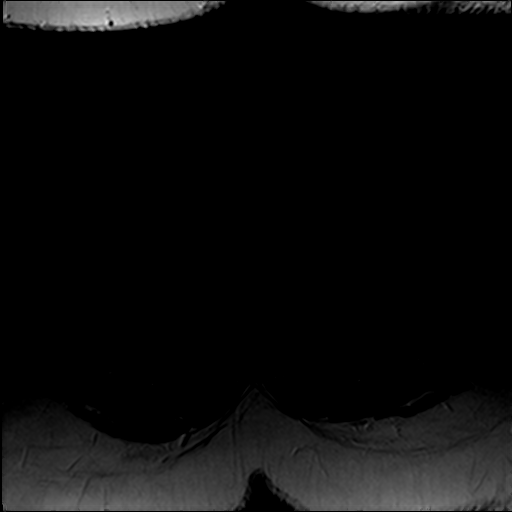

[19 of 48 positions shown; findings below may reference images not displayed]

FINDINGS: The T10-11 level is normal.  The T11-12 disc bulges mildly but there is no herniation or stenosis.  The discs from T12-L1 through L2-L3 are normal.  Distal cord and conus are normal.
 At L3-4, there is a minor disc bulge and mild facet degeneration but no compressive stenosis. 
 At L4-5, there is facet arthropathy bilaterally with 2 mm of anterolisthesis.  There is shallow protrusion of disc material.  No gross neural compression. 
 At L5-S1, there is facet degeneration bilaterally with a millimeter of anterolisthesis.   The disc is degenerated and bulges mildly but there is no herniation or stenosis.
IMPRESSION: 1.  L4-5:  Facet arthropathy with 2 mm of anterolisthesis.  Mild bulging of the disc.  No gross compressive stenosis however. 
 2.  L5-S1:  Facet degeneration with 1 mm of anterolisthesis.  Degeneration and mild bulging of the disc but no compressive stenosis. 
 3.  Very minimal disc bulge and facet degeneration at L3-4 without stenosis.

## 2007-12-08 ENCOUNTER — Encounter: Payer: Self-pay | Admitting: Endocrinology

## 2007-12-22 ENCOUNTER — Telehealth: Payer: Self-pay | Admitting: Endocrinology

## 2008-01-08 ENCOUNTER — Encounter: Payer: Self-pay | Admitting: Internal Medicine

## 2008-01-21 ENCOUNTER — Encounter: Payer: Self-pay | Admitting: Endocrinology

## 2008-01-21 ENCOUNTER — Ambulatory Visit: Payer: Self-pay | Admitting: Internal Medicine

## 2008-03-17 ENCOUNTER — Telehealth: Payer: Self-pay | Admitting: Endocrinology

## 2008-03-17 ENCOUNTER — Encounter (INDEPENDENT_AMBULATORY_CARE_PROVIDER_SITE_OTHER): Payer: Self-pay | Admitting: *Deleted

## 2008-03-23 ENCOUNTER — Telehealth: Payer: Self-pay | Admitting: Endocrinology

## 2008-03-25 ENCOUNTER — Ambulatory Visit: Payer: Self-pay | Admitting: Endocrinology

## 2008-03-25 LAB — CONVERTED CEMR LAB
Ketones, urine, test strip: NEGATIVE
Urobilinogen, UA: 0.2
WBC Urine, dipstick: NEGATIVE
pH: 5

## 2008-04-15 ENCOUNTER — Encounter: Payer: Self-pay | Admitting: Endocrinology

## 2008-06-08 ENCOUNTER — Ambulatory Visit: Payer: Self-pay | Admitting: Gastroenterology

## 2008-06-08 ENCOUNTER — Encounter (INDEPENDENT_AMBULATORY_CARE_PROVIDER_SITE_OTHER): Payer: Self-pay | Admitting: *Deleted

## 2008-06-08 DIAGNOSIS — R1031 Right lower quadrant pain: Secondary | ICD-10-CM | POA: Insufficient documentation

## 2008-06-08 LAB — CONVERTED CEMR LAB
Basophils Absolute: 0.1 10*3/uL (ref 0.0–0.1)
Hemoglobin: 11.4 g/dL — ABNORMAL LOW (ref 12.0–15.0)
Leukocytes, UA: NEGATIVE
Lymphocytes Relative: 34.2 % (ref 12.0–46.0)
Monocytes Relative: 7 % (ref 3.0–12.0)
Neutrophils Relative %: 57.3 % (ref 43.0–77.0)
Nitrite: NEGATIVE
Platelets: 254 10*3/uL (ref 150.0–400.0)
RDW: 13.1 % (ref 11.5–14.6)
Specific Gravity, Urine: >=1.03 (ref 1.000–1.030)
pH: 5 (ref 5.0–8.0)

## 2008-06-10 ENCOUNTER — Ambulatory Visit: Payer: Self-pay | Admitting: Gastroenterology

## 2008-06-16 ENCOUNTER — Encounter: Payer: Self-pay | Admitting: Gastroenterology

## 2008-06-16 ENCOUNTER — Ambulatory Visit: Payer: Self-pay | Admitting: Gastroenterology

## 2008-06-22 ENCOUNTER — Telehealth: Payer: Self-pay | Admitting: Gastroenterology

## 2008-06-22 ENCOUNTER — Encounter: Payer: Self-pay | Admitting: Gastroenterology

## 2008-06-23 ENCOUNTER — Telehealth: Payer: Self-pay | Admitting: Gastroenterology

## 2008-07-13 ENCOUNTER — Ambulatory Visit: Payer: Self-pay | Admitting: Gastroenterology

## 2008-07-13 ENCOUNTER — Encounter: Payer: Self-pay | Admitting: Gastroenterology

## 2008-07-13 ENCOUNTER — Telehealth: Payer: Self-pay | Admitting: Endocrinology

## 2008-07-14 ENCOUNTER — Encounter: Payer: Self-pay | Admitting: Gastroenterology

## 2008-07-18 IMAGING — RF DG ESOPHAGUS
19 of 24 series · 19 of 24 positions shown · non-contrast
Comparison: 01/25/2005

CLINICAL DATA: Dysphagia, feeling of food and medication sticking
in the left side of the neck.

BARIUM SWALLOW / ESOPHAGRAM

[Series 1: run · 1 of 7 slices shown (1 of 19)]
[im 1/7]
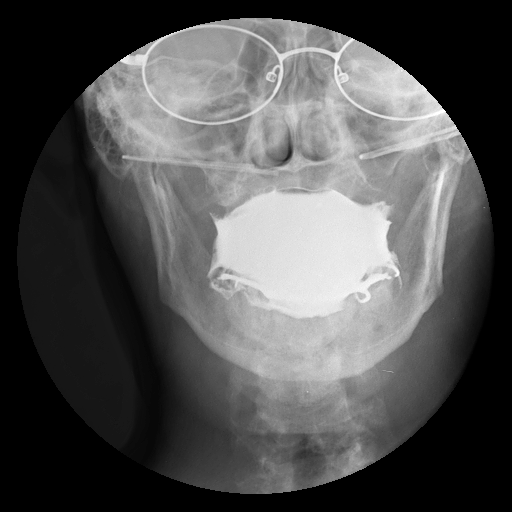

[Series 2: run · 1 of 7 slices shown (2 of 19)]
[im 1/7]
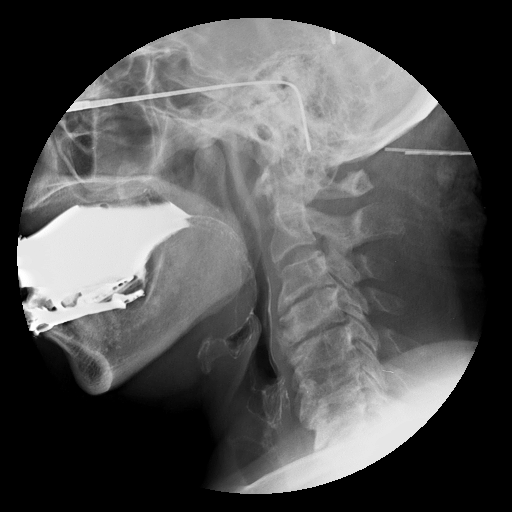

[Series 4: run · 1 of 1 slices shown (3 of 19)]
[im 1/1]
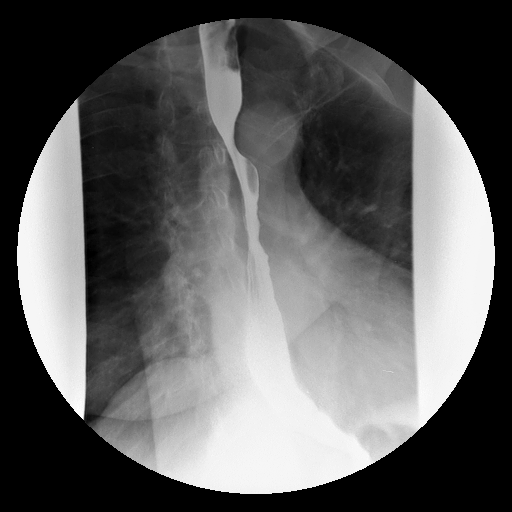

[Series 5: run · 1 of 1 slices shown (4 of 19)]
[im 1/1]
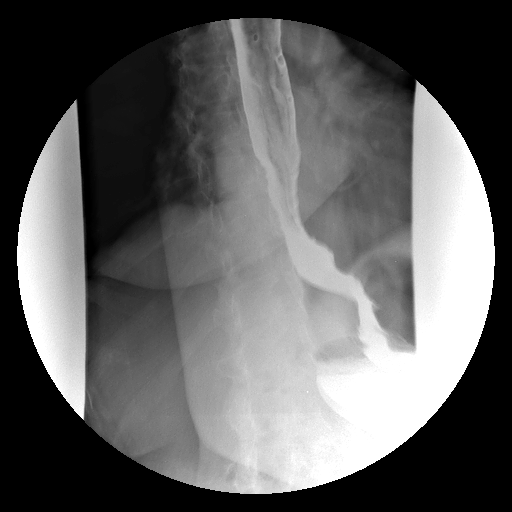

[Series 6: run · 1 of 1 slices shown (5 of 19)]
[im 1/1]
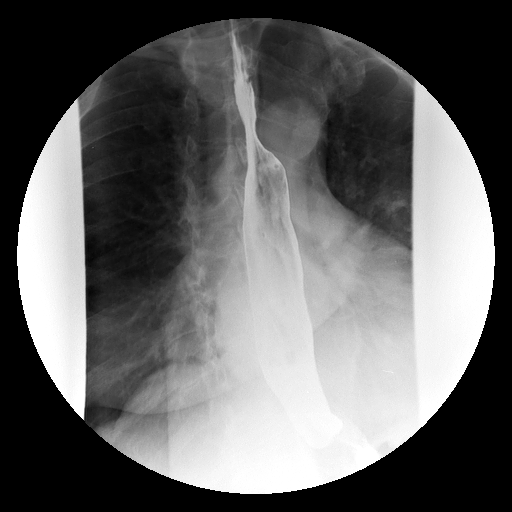

[Series 7: run · 1 of 1 slices shown (6 of 19)]
[im 1/1]
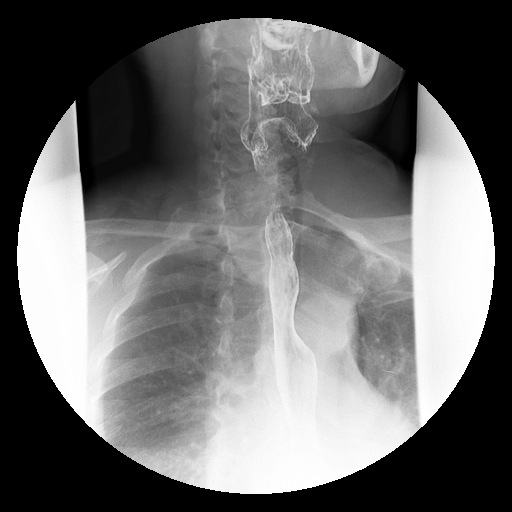

[Series 9: run · 1 of 1 slices shown (7 of 19)]
[im 1/1]
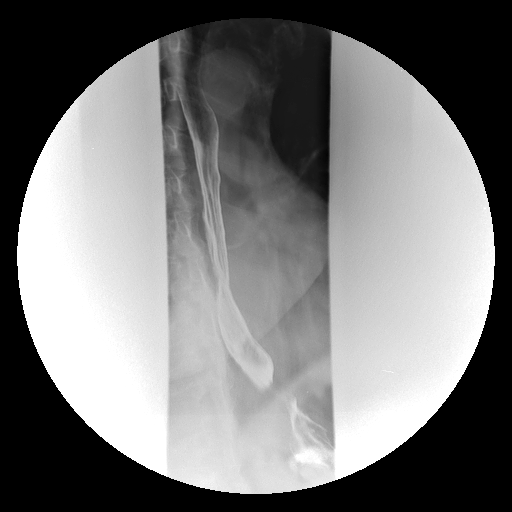

[Series 10: run · 1 of 1 slices shown (8 of 19)]
[im 1/1]
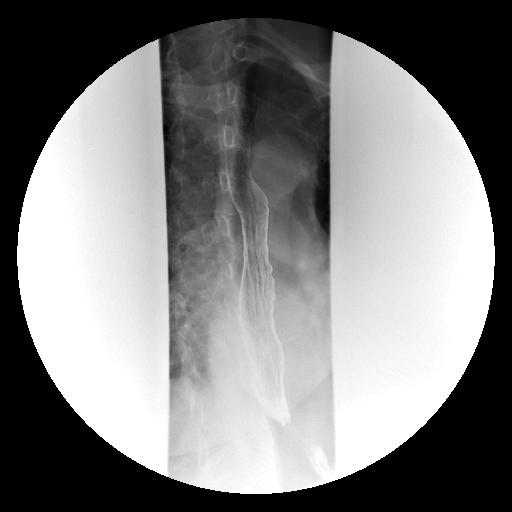

[Series 11: run · 1 of 1 slices shown (9 of 19)]
[im 1/1]
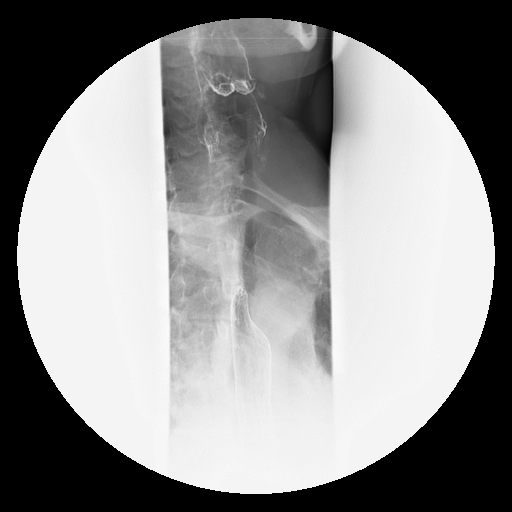

[Series 13: run · 1 of 1 slices shown (10 of 19)]
[im 1/1]
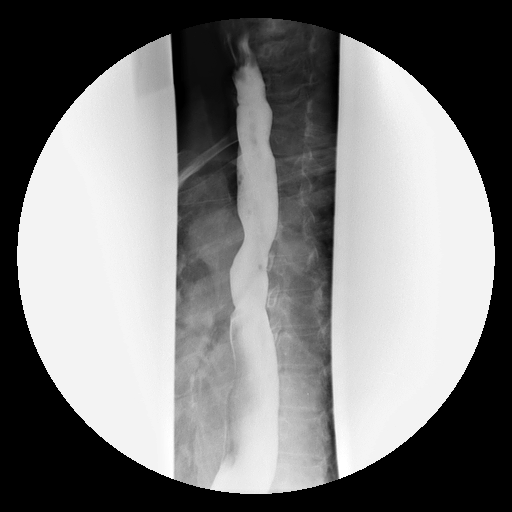

[Series 14: run · 1 of 1 slices shown (11 of 19)]
[im 1/1]
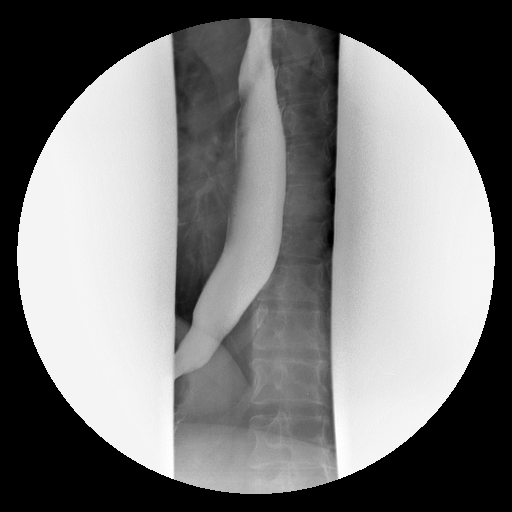

[Series 15: run · 1 of 1 slices shown (12 of 19)]
[im 1/1]
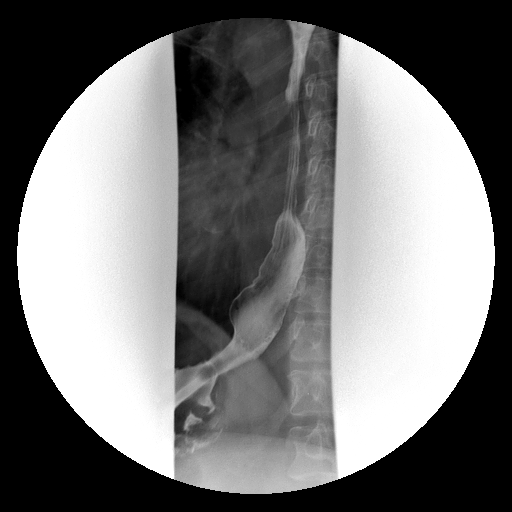

[Series 16: run · 1 of 1 slices shown (13 of 19)]
[im 1/1]
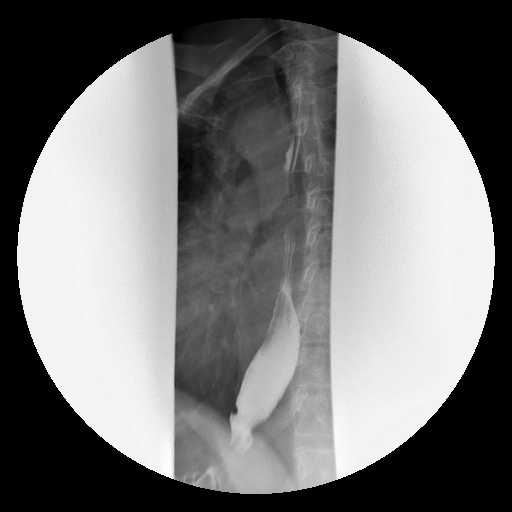

[Series 18: run · 1 of 1 slices shown (14 of 19)]
[im 1/1]
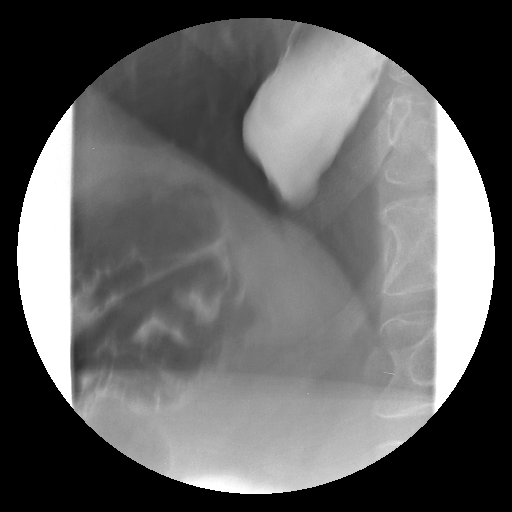

[Series 19: run · 1 of 1 slices shown (15 of 19)]
[im 1/1]
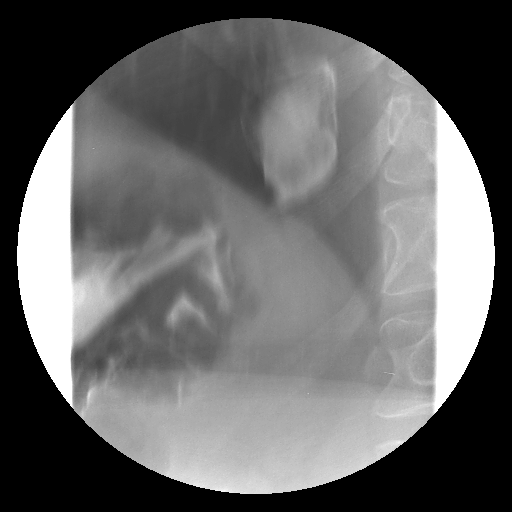

[Series 20: run · 1 of 1 slices shown (16 of 19)]
[im 1/1]
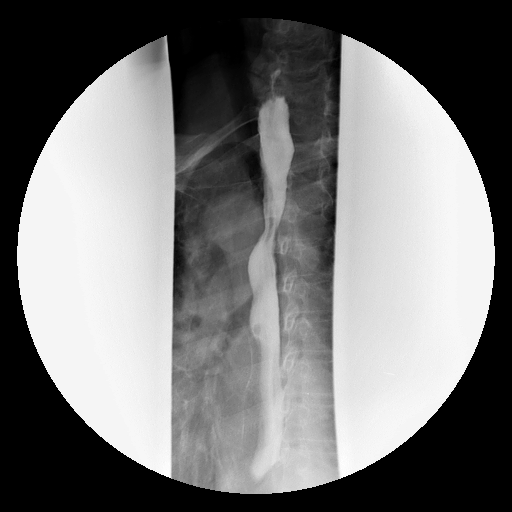

[Series 21: run · 1 of 1 slices shown (17 of 19)]
[im 1/1]
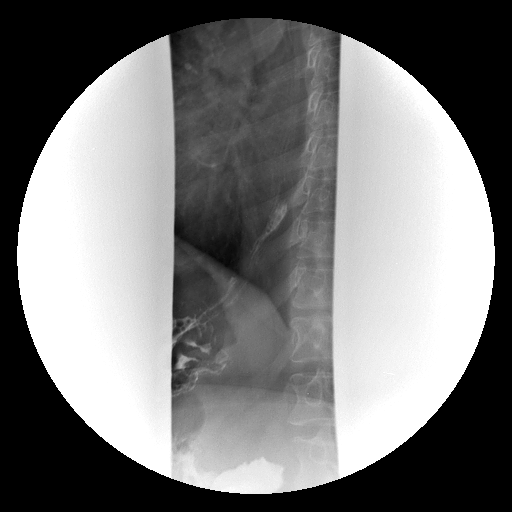

[Series 23: run · 1 of 1 slices shown (18 of 19)]
[im 1/1]
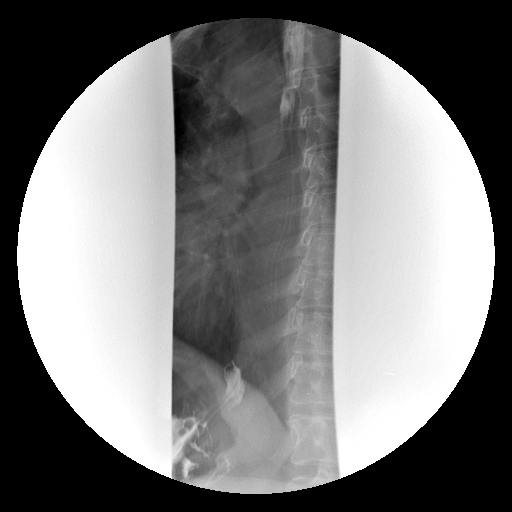

[Series 24: run · 1 of 1 slices shown (19 of 19)]
[im 1/1]
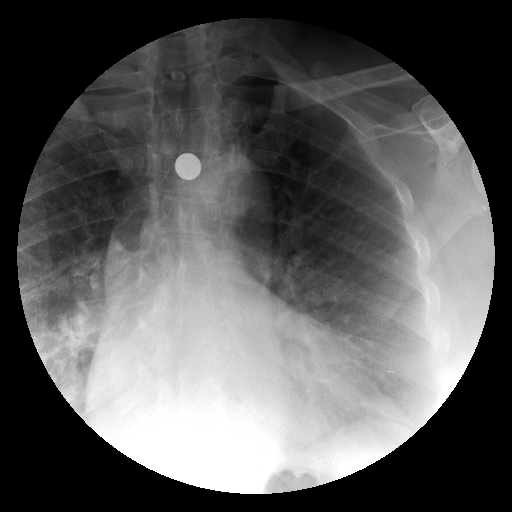

[19 of 24 positions shown; findings below may reference images not displayed]

FINDINGS: Pharyngeal phase of swallowing was normal.  No structural
lesions identified.  Esophageal motility was normal, with [DATE]
primary wave reaching the gastroesophageal junction.  Tiny hiatal
hernia was present intermittently, consistent with sliding.
Mucosal fold flow within normal limits.  No fixed strictures or
obstruction.  Patency was assessed using 13 mm barium tablet, which
showed some delay of passage at the level of the aortic arch, but
passed quickly with repeat swallow.

No reflux was observed.
IMPRESSION: 1.  Tiny hiatal hernia without other anatomic abnormalities.
2.  Normal esophageal motility, with some very mild stasis of the
13-mm barium tablet at the level of the aortic arch.

## 2008-07-27 ENCOUNTER — Encounter (INDEPENDENT_AMBULATORY_CARE_PROVIDER_SITE_OTHER): Payer: Self-pay | Admitting: *Deleted

## 2008-09-29 IMAGING — CR DG CHEST 2V
1 series · 1 of 1 positions shown · non-contrast
Comparison: CT chest 11/15/2004 and chest x-ray 03/31/2004.

CLINICAL DATA: Chest pain, shortness of breath and cough.

CHEST - 2 VIEW

[view not recorded]
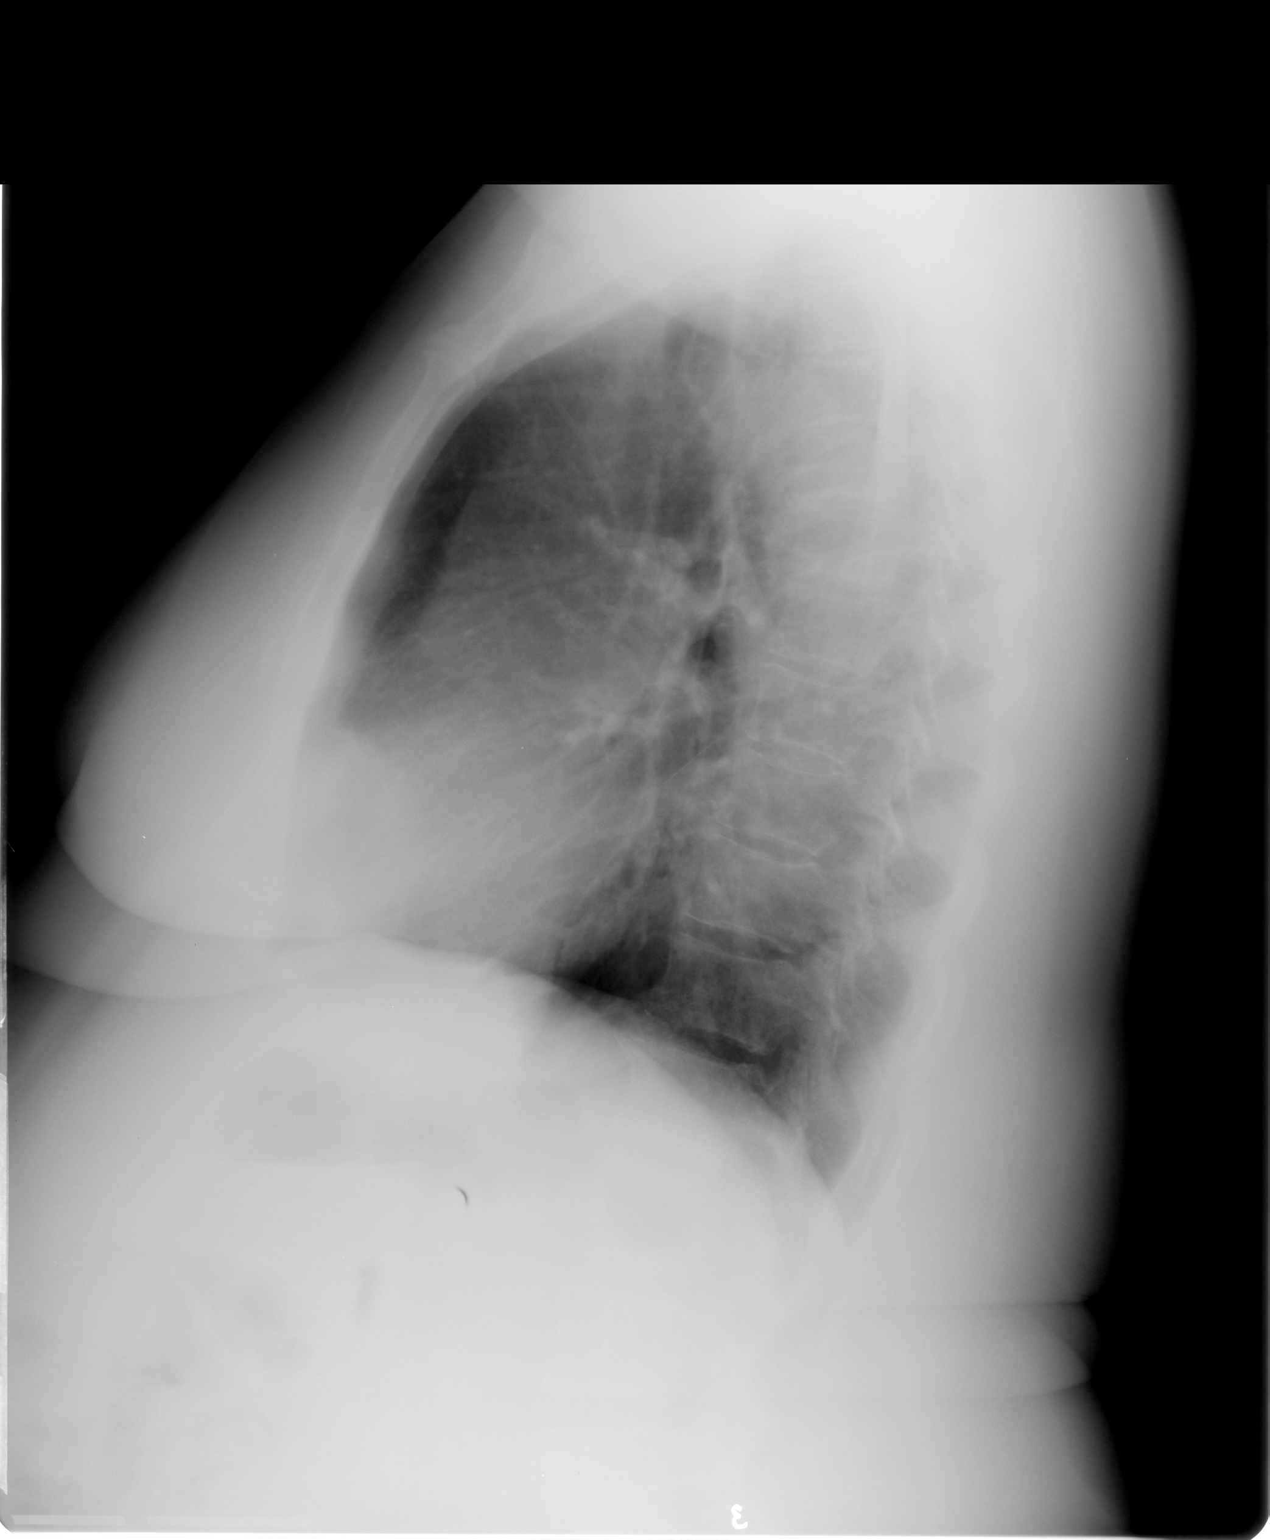

[1 of 1 positions shown; findings below may reference images not displayed]

FINDINGS: Trachea is midline.  Heart size stable.  Lungs are clear.
No pleural fluid.
IMPRESSION: No acute findings.

## 2008-12-12 ENCOUNTER — Encounter: Admission: RE | Admit: 2008-12-12 | Discharge: 2008-12-12 | Payer: Self-pay | Admitting: Internal Medicine

## 2008-12-17 IMAGING — CR DG KNEE 1-2V*R*
2 series · 2 of 2 positions shown · non-contrast
Comparison: None available.

CLINICAL DATA: Osteoarthritis.

RIGHT KNEE - 1-2 VIEW

[view not recorded (1 of 2)]
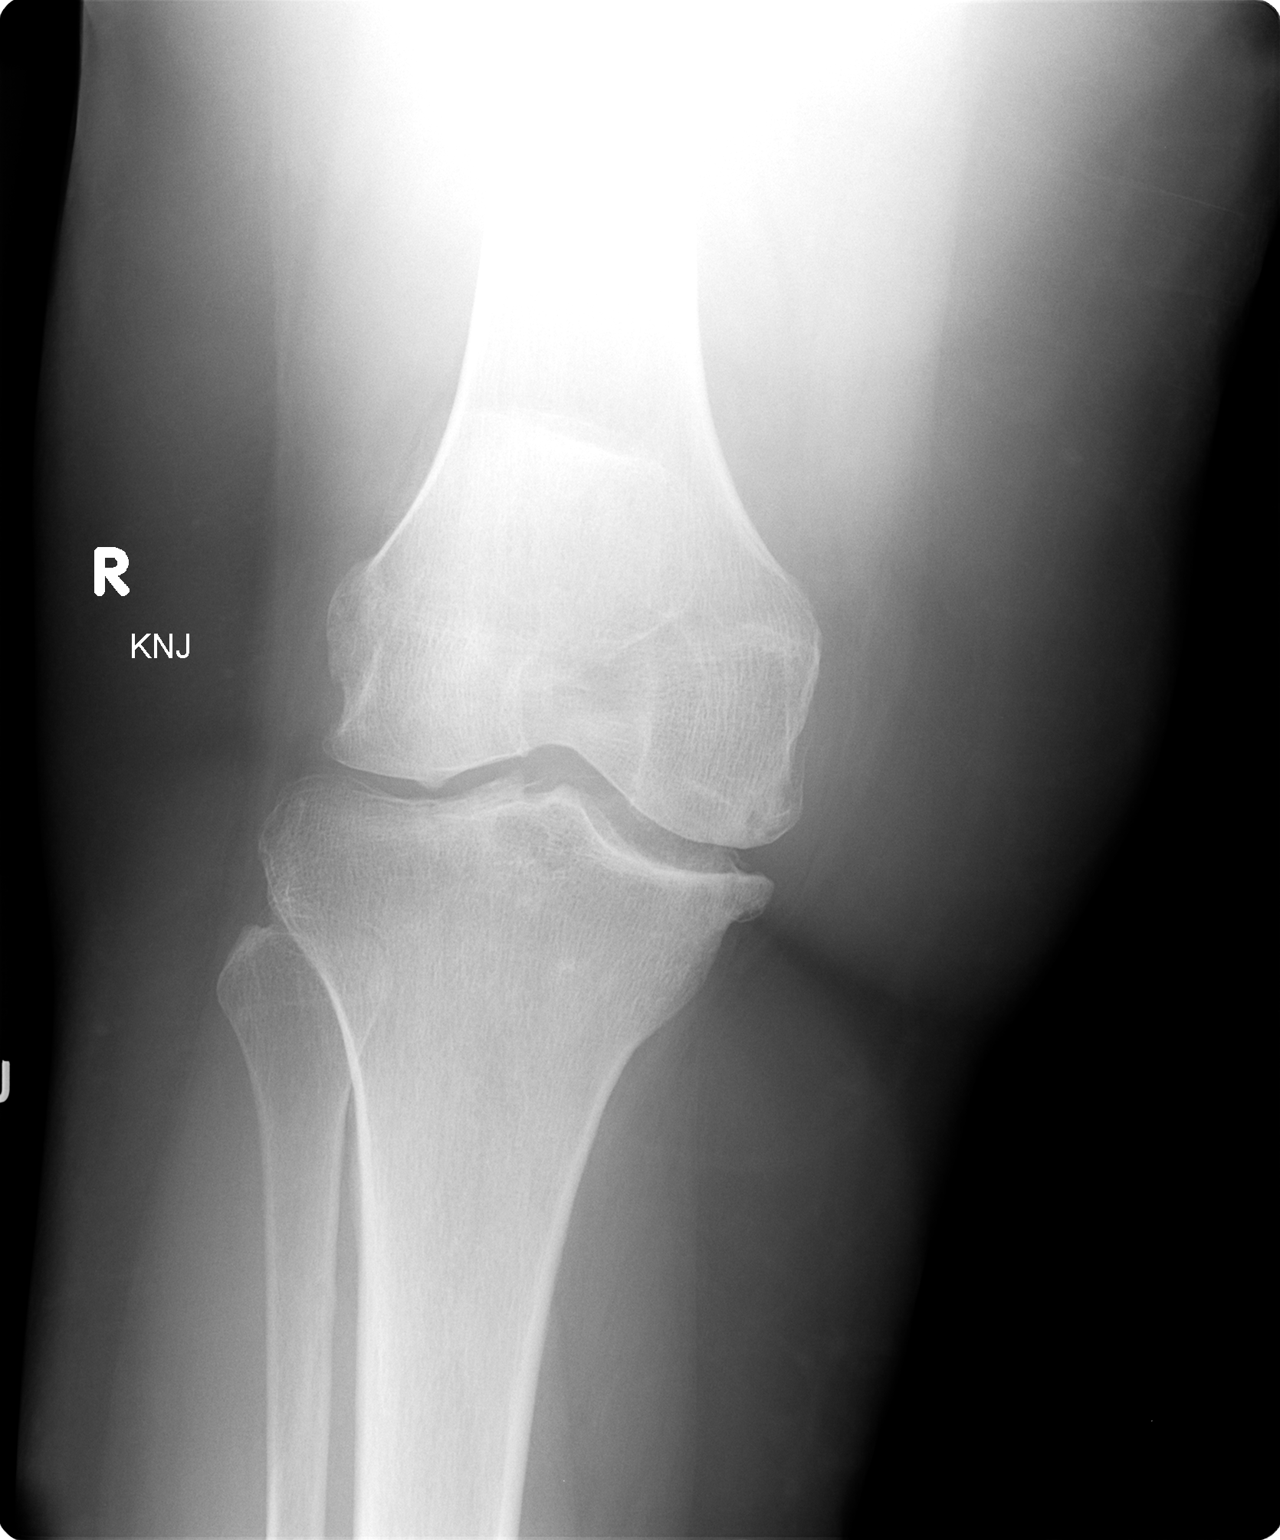

[view not recorded (2 of 2)]
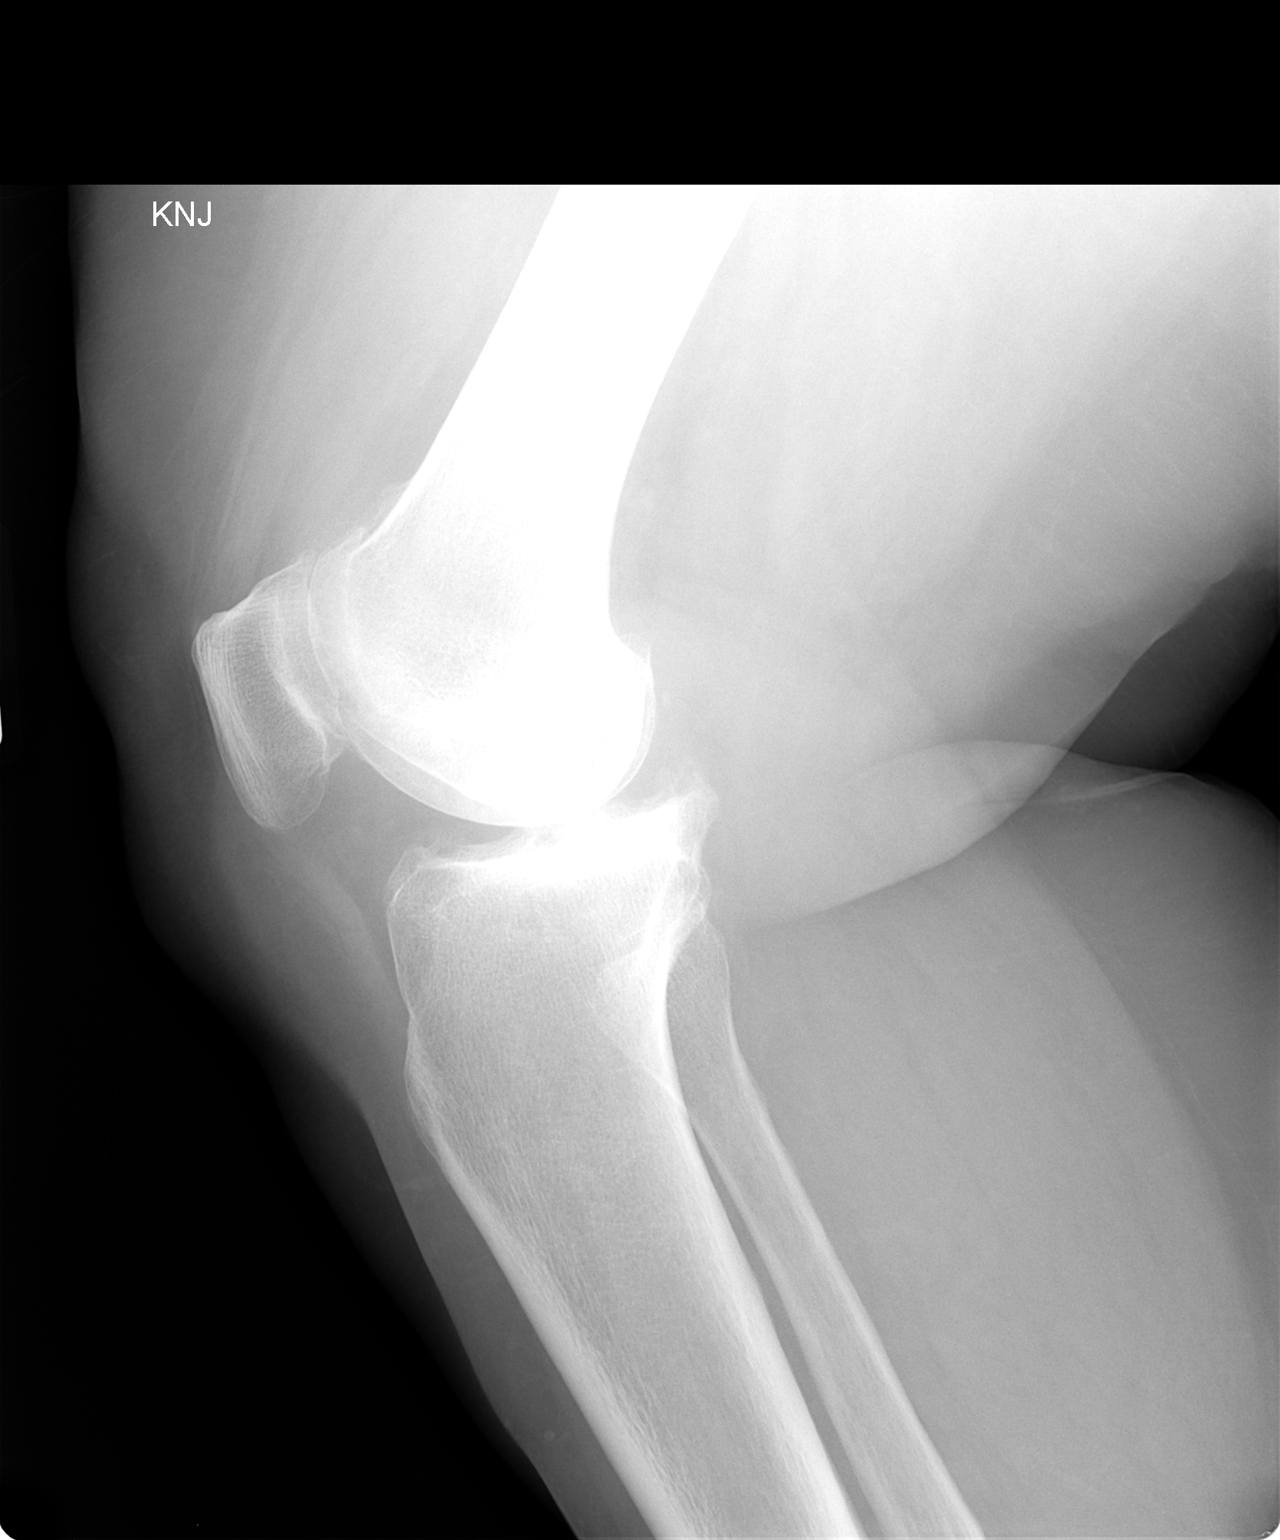

[2 of 2 positions shown; findings below may reference images not displayed]

FINDINGS: There is no acute bony or joint abnormality. The patient
has tricompartmental osteophytosis with some joint space in all
three compartments. No joint effusion.
IMPRESSION: Tricompartmental degenerative disease. No acute finding.

## 2008-12-17 IMAGING — CR DG HIP COMPLETE 2+V*R*
3 series · 3 of 3 positions shown · non-contrast
Comparison: None available.

CLINICAL DATA: Pain, osteoarthritis.

RIGHT HIP - COMPLETE 2+ VIEW

[view not recorded (1 of 3)]
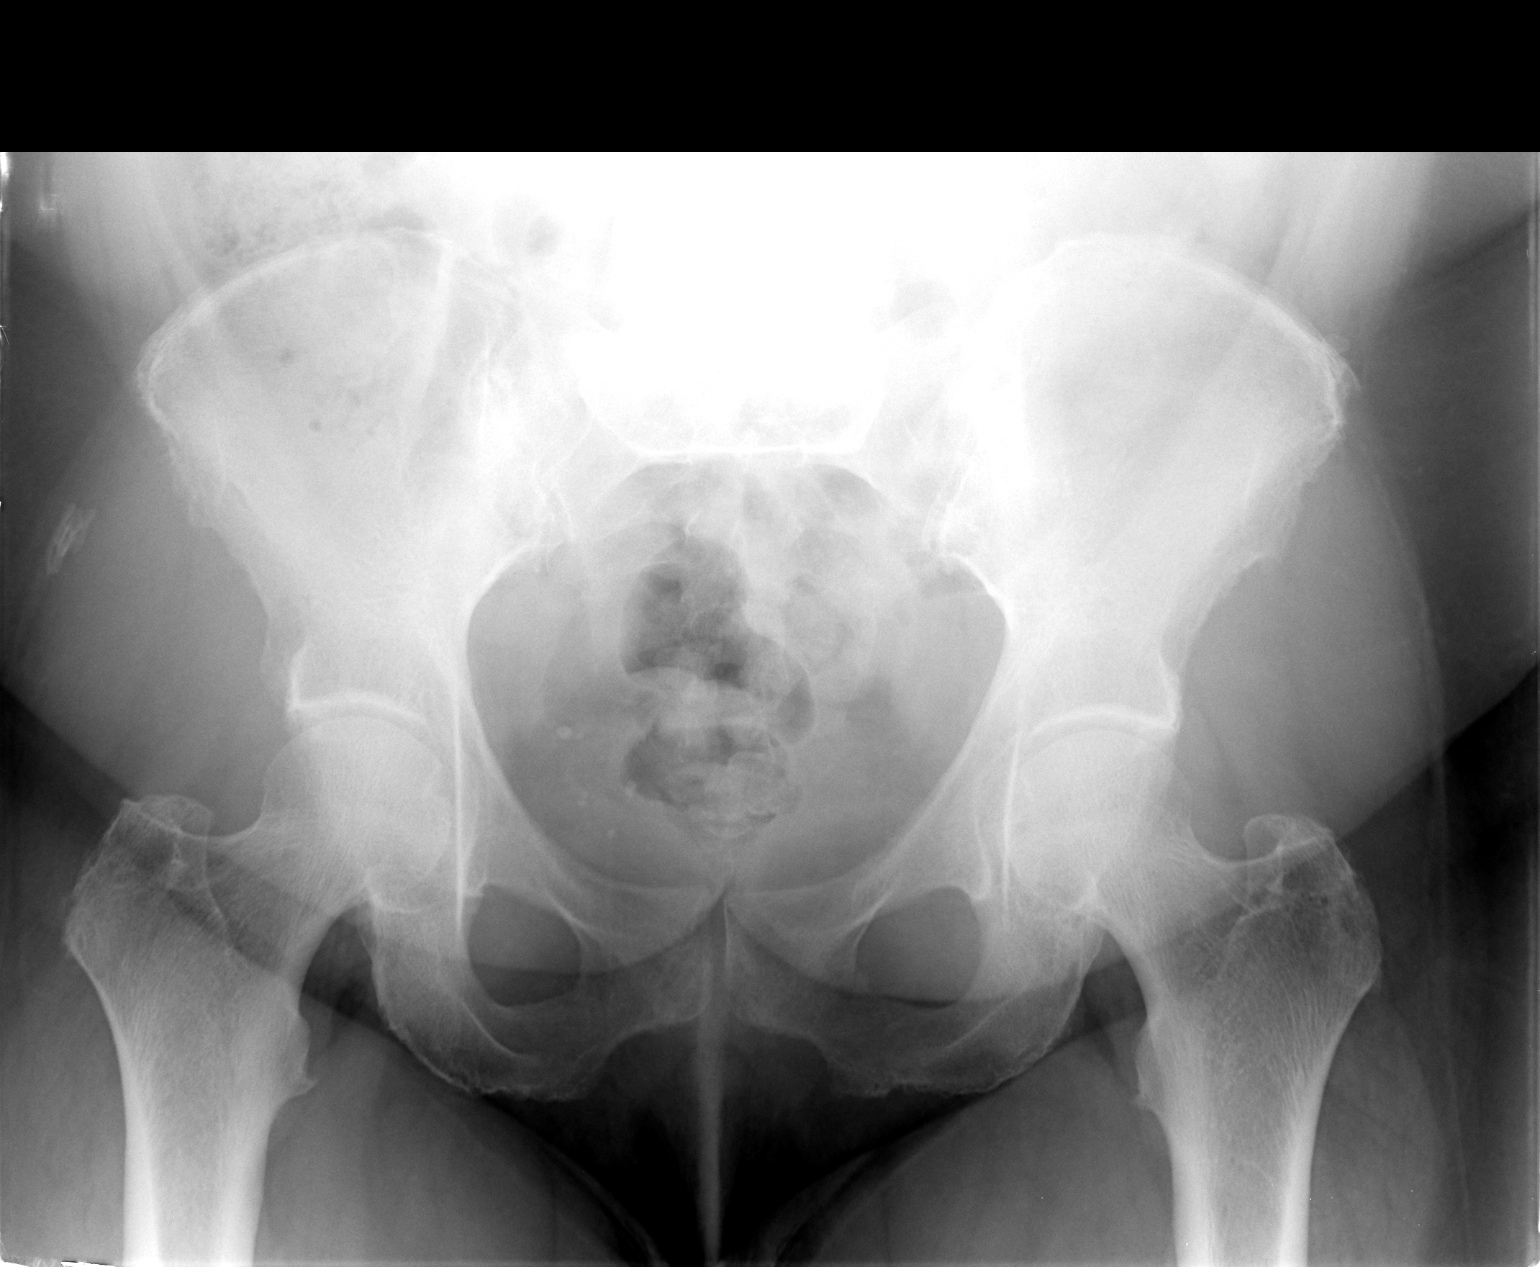

[view not recorded (2 of 3)]
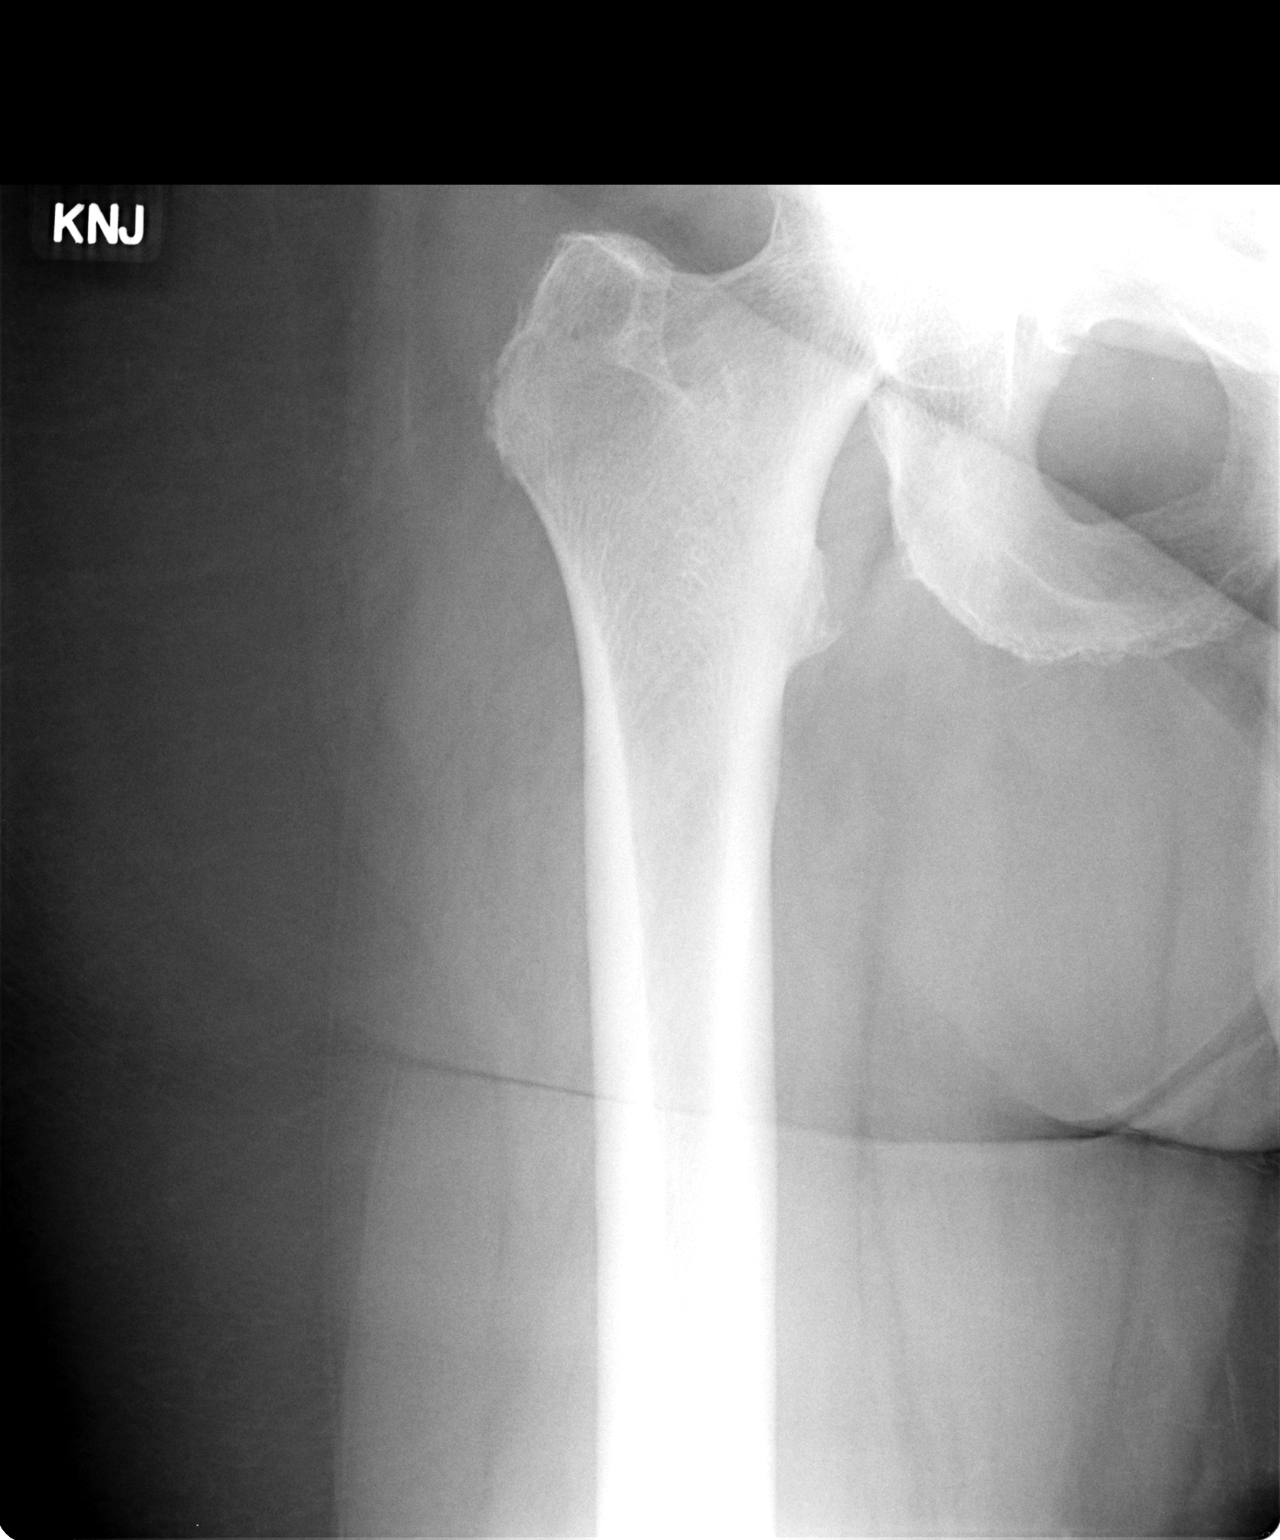

[view not recorded (3 of 3)]
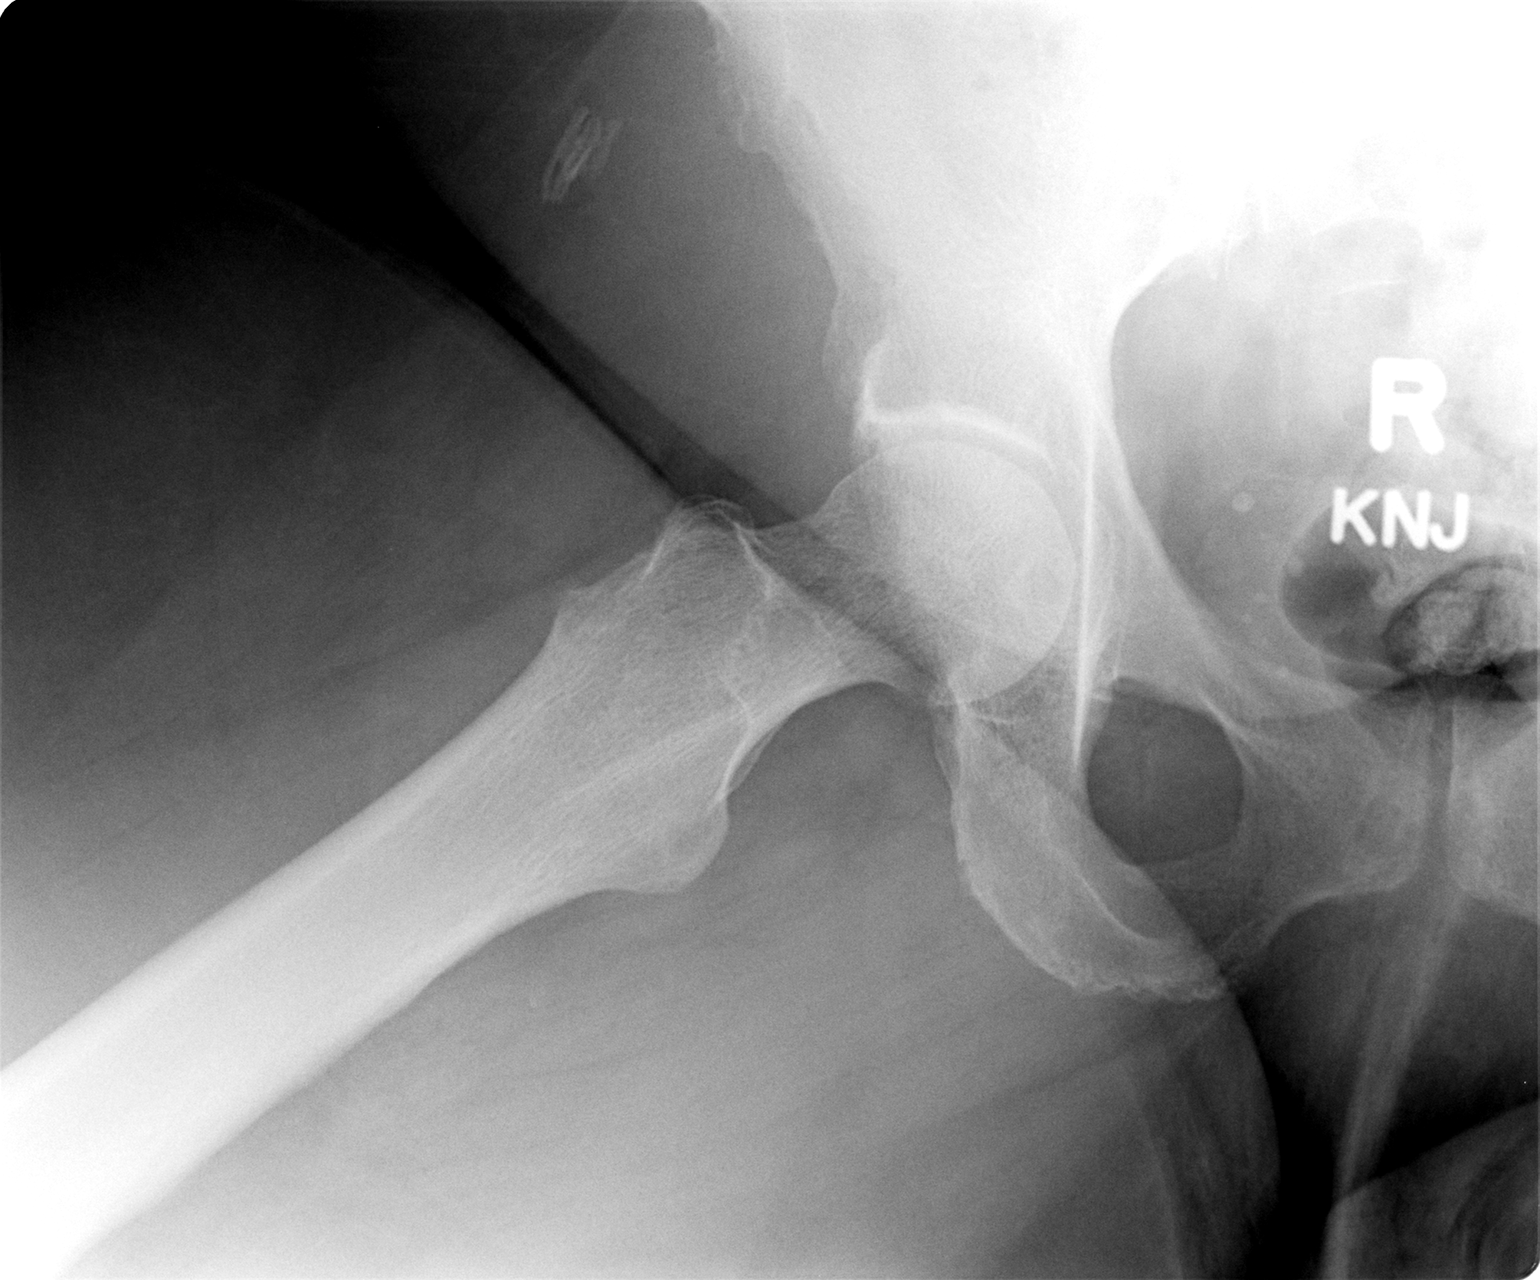

[3 of 3 positions shown; findings below may reference images not displayed]

FINDINGS: The hips are located.  There is no fracture.  Patient has
mild appearing degenerative disease about both hips which appears
symmetric.  No focal bony abnormality.  There is degenerative
disease in the lower lumbar spine.
IMPRESSION: 1.  No acute finding.
2.  Mild bilateral hip osteoarthritis.
3.  Degenerative disease lower lumbar spine.

## 2008-12-29 ENCOUNTER — Telehealth (INDEPENDENT_AMBULATORY_CARE_PROVIDER_SITE_OTHER): Payer: Self-pay | Admitting: *Deleted

## 2009-01-02 ENCOUNTER — Ambulatory Visit: Payer: Self-pay | Admitting: Endocrinology

## 2009-01-02 LAB — CONVERTED CEMR LAB
Calcium, Total (PTH): 9.4 mg/dL (ref 8.4–10.5)
PTH: 83.3 pg/mL — ABNORMAL HIGH (ref 14.0–72.0)

## 2009-01-03 LAB — CONVERTED CEMR LAB
ALT: 14 units/L (ref 0–35)
AST: 15 units/L (ref 0–37)
Alkaline Phosphatase: 115 units/L (ref 39–117)
Basophils Relative: 0.5 % (ref 0.0–3.0)
Bilirubin, Direct: 0.1 mg/dL (ref 0.0–0.3)
Chloride: 101 meq/L (ref 96–112)
Creatinine, Ser: 1.3 mg/dL — ABNORMAL HIGH (ref 0.4–1.2)
Direct LDL: 190 mg/dL
Eosinophils Relative: 1.8 % (ref 0.0–5.0)
GFR calc non Af Amer: 53.1 mL/min (ref >60)
HCT: 37.2 % (ref 36.0–46.0)
HDL: 47 mg/dL (ref >39.00)
Hgb A1c MFr Bld: 11.4 % — ABNORMAL HIGH (ref 4.6–6.5)
Leukocytes, UA: NEGATIVE
MCV: 74.5 fL — ABNORMAL LOW (ref 78.0–100.0)
Microalb Creat Ratio: 28.1 mg/g (ref 0.0–30.0)
Monocytes Absolute: 0.5 10*3/uL (ref 0.1–1.0)
Monocytes Relative: 6.5 % (ref 3.0–12.0)
Neutrophils Relative %: 70.4 % (ref 43.0–77.0)
Nitrite: NEGATIVE
Potassium: 3.9 meq/L (ref 3.5–5.1)
RBC: 4.99 M/uL (ref 3.87–5.11)
Saturation Ratios: 15.5 % — ABNORMAL LOW (ref 20.0–50.0)
Specific Gravity, Urine: 1.01 (ref 1.000–1.030)
Total Bilirubin: 0.8 mg/dL (ref 0.3–1.2)
Total CHOL/HDL Ratio: 6
Total Protein, Urine: NEGATIVE mg/dL
Total Protein: 8.2 g/dL (ref 6.0–8.3)
VLDL: 43.4 mg/dL — ABNORMAL HIGH (ref 0.0–40.0)
WBC: 8.4 10*3/uL (ref 4.5–10.5)
pH: 5.5 (ref 5.0–8.0)

## 2009-01-04 ENCOUNTER — Encounter: Payer: Self-pay | Admitting: Endocrinology

## 2009-01-30 ENCOUNTER — Observation Stay (HOSPITAL_COMMUNITY): Admission: EM | Admit: 2009-01-30 | Discharge: 2009-02-02 | Payer: Self-pay | Admitting: Emergency Medicine

## 2009-01-30 ENCOUNTER — Ambulatory Visit: Payer: Self-pay | Admitting: Cardiovascular Disease

## 2009-01-31 ENCOUNTER — Encounter: Payer: Self-pay | Admitting: Cardiology

## 2009-02-01 ENCOUNTER — Encounter (INDEPENDENT_AMBULATORY_CARE_PROVIDER_SITE_OTHER): Payer: Self-pay | Admitting: Internal Medicine

## 2009-02-16 ENCOUNTER — Ambulatory Visit: Payer: Self-pay | Admitting: Endocrinology

## 2009-02-16 ENCOUNTER — Telehealth: Payer: Self-pay | Admitting: Endocrinology

## 2009-02-16 DIAGNOSIS — N259 Disorder resulting from impaired renal tubular function, unspecified: Secondary | ICD-10-CM

## 2009-02-16 DIAGNOSIS — E7800 Pure hypercholesterolemia, unspecified: Secondary | ICD-10-CM

## 2009-02-16 HISTORY — DX: Disorder resulting from impaired renal tubular function, unspecified: N25.9

## 2009-02-27 ENCOUNTER — Encounter: Payer: Self-pay | Admitting: Endocrinology

## 2009-02-27 ENCOUNTER — Telehealth (INDEPENDENT_AMBULATORY_CARE_PROVIDER_SITE_OTHER): Payer: Self-pay | Admitting: *Deleted

## 2009-03-01 ENCOUNTER — Telehealth: Payer: Self-pay | Admitting: Endocrinology

## 2009-03-02 ENCOUNTER — Encounter: Payer: Self-pay | Admitting: Internal Medicine

## 2009-03-03 ENCOUNTER — Ambulatory Visit: Payer: Self-pay | Admitting: Endocrinology

## 2009-03-07 ENCOUNTER — Ambulatory Visit: Payer: Self-pay | Admitting: Pulmonary Disease

## 2009-03-07 DIAGNOSIS — I2789 Other specified pulmonary heart diseases: Secondary | ICD-10-CM

## 2009-03-07 DIAGNOSIS — IMO0002 Reserved for concepts with insufficient information to code with codable children: Secondary | ICD-10-CM

## 2009-03-07 HISTORY — DX: Reserved for concepts with insufficient information to code with codable children: IMO0002

## 2009-03-10 ENCOUNTER — Telehealth: Payer: Self-pay | Admitting: Internal Medicine

## 2009-03-13 ENCOUNTER — Telehealth: Payer: Self-pay | Admitting: Endocrinology

## 2009-03-15 ENCOUNTER — Ambulatory Visit: Payer: Self-pay | Admitting: Pulmonary Disease

## 2009-03-20 ENCOUNTER — Encounter: Payer: Self-pay | Admitting: Pulmonary Disease

## 2009-04-11 ENCOUNTER — Encounter: Payer: Self-pay | Admitting: Pulmonary Disease

## 2009-04-14 ENCOUNTER — Encounter: Payer: Self-pay | Admitting: Endocrinology

## 2009-04-14 ENCOUNTER — Telehealth (INDEPENDENT_AMBULATORY_CARE_PROVIDER_SITE_OTHER): Payer: Self-pay | Admitting: *Deleted

## 2009-04-18 ENCOUNTER — Ambulatory Visit: Payer: Self-pay | Admitting: Pulmonary Disease

## 2009-04-24 ENCOUNTER — Telehealth (INDEPENDENT_AMBULATORY_CARE_PROVIDER_SITE_OTHER): Payer: Self-pay | Admitting: *Deleted

## 2009-04-25 ENCOUNTER — Telehealth (INDEPENDENT_AMBULATORY_CARE_PROVIDER_SITE_OTHER): Payer: Self-pay | Admitting: *Deleted

## 2009-04-25 ENCOUNTER — Encounter: Payer: Self-pay | Admitting: Endocrinology

## 2009-05-08 ENCOUNTER — Encounter: Payer: Self-pay | Admitting: Pulmonary Disease

## 2009-05-08 ENCOUNTER — Ambulatory Visit (HOSPITAL_BASED_OUTPATIENT_CLINIC_OR_DEPARTMENT_OTHER): Admission: RE | Admit: 2009-05-08 | Discharge: 2009-05-08 | Payer: Self-pay | Admitting: Pulmonary Disease

## 2009-05-22 ENCOUNTER — Encounter: Payer: Self-pay | Admitting: Endocrinology

## 2009-05-24 ENCOUNTER — Encounter: Payer: Self-pay | Admitting: Endocrinology

## 2009-05-25 ENCOUNTER — Telehealth (INDEPENDENT_AMBULATORY_CARE_PROVIDER_SITE_OTHER): Payer: Self-pay | Admitting: *Deleted

## 2009-05-30 ENCOUNTER — Ambulatory Visit: Payer: Self-pay | Admitting: Pulmonary Disease

## 2009-06-02 ENCOUNTER — Ambulatory Visit: Payer: Self-pay | Admitting: Endocrinology

## 2009-06-06 ENCOUNTER — Telehealth (INDEPENDENT_AMBULATORY_CARE_PROVIDER_SITE_OTHER): Payer: Self-pay | Admitting: *Deleted

## 2009-06-06 ENCOUNTER — Encounter: Payer: Self-pay | Admitting: Endocrinology

## 2009-06-08 ENCOUNTER — Encounter: Payer: Self-pay | Admitting: Endocrinology

## 2009-06-16 ENCOUNTER — Ambulatory Visit: Payer: Self-pay | Admitting: Pulmonary Disease

## 2009-06-16 DIAGNOSIS — G4733 Obstructive sleep apnea (adult) (pediatric): Secondary | ICD-10-CM

## 2009-06-20 ENCOUNTER — Telehealth: Payer: Self-pay | Admitting: Endocrinology

## 2009-06-21 ENCOUNTER — Encounter: Payer: Self-pay | Admitting: Endocrinology

## 2009-06-25 ENCOUNTER — Encounter: Payer: Self-pay | Admitting: Endocrinology

## 2009-06-27 ENCOUNTER — Ambulatory Visit: Payer: Self-pay | Admitting: Endocrinology

## 2009-07-03 ENCOUNTER — Ambulatory Visit (HOSPITAL_COMMUNITY): Admission: RE | Admit: 2009-07-03 | Discharge: 2009-07-03 | Payer: Self-pay | Admitting: Endocrinology

## 2009-07-04 ENCOUNTER — Encounter: Payer: Self-pay | Admitting: Pulmonary Disease

## 2009-07-04 ENCOUNTER — Telehealth (INDEPENDENT_AMBULATORY_CARE_PROVIDER_SITE_OTHER): Payer: Self-pay | Admitting: *Deleted

## 2009-07-05 ENCOUNTER — Encounter: Payer: Self-pay | Admitting: Cardiovascular Disease

## 2009-07-05 ENCOUNTER — Encounter: Payer: Self-pay | Admitting: Endocrinology

## 2009-07-05 ENCOUNTER — Ambulatory Visit: Payer: Self-pay

## 2009-07-05 ENCOUNTER — Ambulatory Visit: Payer: Self-pay | Admitting: Cardiovascular Disease

## 2009-07-05 ENCOUNTER — Encounter (HOSPITAL_COMMUNITY): Admission: RE | Admit: 2009-07-05 | Discharge: 2009-08-22 | Payer: Self-pay | Admitting: Endocrinology

## 2009-07-06 ENCOUNTER — Ambulatory Visit: Payer: Self-pay

## 2009-07-08 ENCOUNTER — Encounter: Payer: Self-pay | Admitting: Endocrinology

## 2009-07-11 ENCOUNTER — Encounter: Payer: Self-pay | Admitting: Pulmonary Disease

## 2009-07-17 ENCOUNTER — Ambulatory Visit: Payer: Self-pay | Admitting: Cardiology

## 2009-07-17 DIAGNOSIS — R9439 Abnormal result of other cardiovascular function study: Secondary | ICD-10-CM

## 2009-07-17 DIAGNOSIS — R0602 Shortness of breath: Secondary | ICD-10-CM

## 2009-07-20 ENCOUNTER — Inpatient Hospital Stay (HOSPITAL_BASED_OUTPATIENT_CLINIC_OR_DEPARTMENT_OTHER): Admission: RE | Admit: 2009-07-20 | Discharge: 2009-07-20 | Payer: Self-pay | Admitting: Cardiology

## 2009-07-20 ENCOUNTER — Ambulatory Visit: Payer: Self-pay | Admitting: Cardiology

## 2009-07-28 ENCOUNTER — Ambulatory Visit: Payer: Self-pay | Admitting: Pulmonary Disease

## 2009-08-01 ENCOUNTER — Ambulatory Visit: Payer: Self-pay | Admitting: Endocrinology

## 2009-08-02 ENCOUNTER — Encounter: Payer: Self-pay | Admitting: Endocrinology

## 2009-08-10 ENCOUNTER — Encounter: Payer: Self-pay | Admitting: Endocrinology

## 2009-08-31 ENCOUNTER — Encounter: Payer: Self-pay | Admitting: Endocrinology

## 2009-09-27 ENCOUNTER — Telehealth (INDEPENDENT_AMBULATORY_CARE_PROVIDER_SITE_OTHER): Payer: Self-pay | Admitting: *Deleted

## 2009-09-30 ENCOUNTER — Encounter: Payer: Self-pay | Admitting: Pulmonary Disease

## 2009-11-02 ENCOUNTER — Ambulatory Visit: Payer: Self-pay | Admitting: Endocrinology

## 2009-11-08 ENCOUNTER — Encounter: Payer: Self-pay | Admitting: Endocrinology

## 2009-11-09 ENCOUNTER — Inpatient Hospital Stay (HOSPITAL_COMMUNITY): Admission: EM | Admit: 2009-11-09 | Discharge: 2009-11-13 | Payer: Self-pay | Admitting: Internal Medicine

## 2009-11-13 ENCOUNTER — Encounter: Payer: Self-pay | Admitting: Endocrinology

## 2009-11-16 ENCOUNTER — Ambulatory Visit: Payer: Self-pay | Admitting: Endocrinology

## 2009-11-21 DIAGNOSIS — J986 Disorders of diaphragm: Secondary | ICD-10-CM

## 2009-11-21 HISTORY — DX: Disorders of diaphragm: J98.6

## 2009-11-24 ENCOUNTER — Encounter: Payer: Self-pay | Admitting: Cardiology

## 2009-11-28 ENCOUNTER — Ambulatory Visit: Payer: Self-pay | Admitting: Cardiology

## 2009-12-01 ENCOUNTER — Encounter: Payer: Self-pay | Admitting: Endocrinology

## 2009-12-05 ENCOUNTER — Ambulatory Visit: Payer: Self-pay | Admitting: Pulmonary Disease

## 2009-12-05 DIAGNOSIS — J189 Pneumonia, unspecified organism: Secondary | ICD-10-CM

## 2009-12-19 ENCOUNTER — Ambulatory Visit: Payer: Self-pay | Admitting: Endocrinology

## 2010-01-23 ENCOUNTER — Encounter
Admission: RE | Admit: 2010-01-23 | Discharge: 2010-01-23 | Payer: Self-pay | Source: Home / Self Care | Attending: Internal Medicine | Admitting: Internal Medicine

## 2010-01-30 ENCOUNTER — Telehealth (INDEPENDENT_AMBULATORY_CARE_PROVIDER_SITE_OTHER): Payer: Self-pay | Admitting: *Deleted

## 2010-02-01 ENCOUNTER — Telehealth (INDEPENDENT_AMBULATORY_CARE_PROVIDER_SITE_OTHER): Payer: Self-pay | Admitting: *Deleted

## 2010-02-05 ENCOUNTER — Encounter: Payer: Self-pay | Admitting: Pulmonary Disease

## 2010-02-08 ENCOUNTER — Telehealth (INDEPENDENT_AMBULATORY_CARE_PROVIDER_SITE_OTHER): Payer: Self-pay | Admitting: *Deleted

## 2010-02-10 ENCOUNTER — Encounter: Payer: Self-pay | Admitting: Endocrinology

## 2010-02-14 ENCOUNTER — Telehealth: Payer: Self-pay | Admitting: Endocrinology

## 2010-02-18 LAB — CONVERTED CEMR LAB
BUN: 24 mg/dL — ABNORMAL HIGH (ref 6–23)
Basophils Absolute: 0 10*3/uL (ref 0.0–0.1)
Basophils Relative: 0.4 % (ref 0.0–3.0)
CO2: 28 meq/L (ref 19–32)
CO2: 29 meq/L (ref 19–32)
Calcium: 8.7 mg/dL (ref 8.4–10.5)
Calcium: 9 mg/dL (ref 8.4–10.5)
Chloride: 106 meq/L (ref 96–112)
Chloride: 108 meq/L (ref 96–112)
Chloride: 108 meq/L (ref 96–112)
Creatinine, Ser: 1.1 mg/dL (ref 0.4–1.2)
Creatinine, Ser: 1.5 mg/dL — ABNORMAL HIGH (ref 0.4–1.2)
Eosinophils Relative: 1.8 % (ref 0.0–5.0)
GFR calc non Af Amer: 45.65 mL/min (ref >60)
Glucose, Bld: 166 mg/dL — ABNORMAL HIGH (ref 70–99)
Glucose, Bld: 183 mg/dL — ABNORMAL HIGH (ref 70–99)
Glucose, Bld: 209 mg/dL — ABNORMAL HIGH (ref 70–99)
HCT: 32 % — ABNORMAL LOW (ref 36.0–46.0)
HCT: 32.3 % — ABNORMAL LOW (ref 36.0–46.0)
HDL: 58.5 mg/dL (ref >39.00)
Hemoglobin: 10.5 g/dL — ABNORMAL LOW (ref 12.0–15.0)
Hgb A1c MFr Bld: 11.2 % — ABNORMAL HIGH (ref 4.6–6.0)
INR: 1 (ref 0.8–1.0)
Iron: 58 ug/dL (ref 42–145)
Lymphs Abs: 2.8 10*3/uL (ref 0.7–4.0)
MCV: 74.5 fL — ABNORMAL LOW (ref 78.0–100.0)
MCV: 75.7 fL — ABNORMAL LOW (ref 78.0–100.0)
Monocytes Absolute: 0.5 10*3/uL (ref 0.1–1.0)
Monocytes Absolute: 0.6 10*3/uL (ref 0.1–1.0)
Neutro Abs: 4.6 10*3/uL (ref 1.4–7.7)
Neutrophils Relative %: 61.2 % (ref 43.0–77.0)
Neutrophils Relative %: 63.7 % (ref 43.0–77.0)
Platelets: 268 10*3/uL (ref 150.0–400.0)
Potassium: 4 meq/L (ref 3.5–5.1)
Potassium: 4.2 meq/L (ref 3.5–5.1)
RBC: 4.27 M/uL (ref 3.87–5.11)
RDW: 15.1 % — ABNORMAL HIGH (ref 11.5–14.6)
Sed Rate: 53 mm/hr — ABNORMAL HIGH (ref 0–22)
Sodium: 142 meq/L (ref 135–145)
TSH: 2.79 microintl units/mL (ref 0.35–5.50)
Total CHOL/HDL Ratio: 3
Triglycerides: 124 mg/dL (ref 0.0–149.0)
WBC: 7.2 10*3/uL (ref 4.5–10.5)
WBC: 9.3 10*3/uL (ref 4.5–10.5)

## 2010-02-22 NOTE — Consult Note (Signed)
Summary: Consultation Report [facility]  Consultation Report - [facility]   Imported By: Marilynne Drivers 02/27/2009 09:56:22  _____________________________________________________________________  External Attachment:    Type:   Image     Comment:   External Document

## 2010-02-22 NOTE — Assessment & Plan Note (Signed)
Summary: weak,blurry vision, pt declined to see another md-lb   Vital Signs:  Patient profile:   65 year old female Height:      64 inches (162.56 cm) Weight:      282.4 pounds (128.36 kg) O2 Sat:      93 % on Room air Temp:     98.7 degrees F (37.06 degrees C) oral Pulse rate:   88 / minute BP sitting:   122 / 68  (left arm) Cuff size:   large  Vitals Entered By: Tomma Lightning (June 27, 2009 1:05 PM)  O2 Flow:  Room air CC: weak/ blurr vision Is Patient Diabetic? Yes Did you bring your meter with you today? No   Referring Provider:  Renato Shin Primary Provider:  Sheliah Hatch  CC:  weak/ blurr vision.  History of Present Illness: no cbg record, but states cbg's vary from 53 (am) up to 478 (afternoon).  she says it is not necessarily higher as the day goes on. pt states 2 weeks of intermittent moderate blurry vision in both eyes, and associated fatigue and headaches.  she says she has seen the eye dr within the past year, and she was told her eyes were ok except for dry eyes.  chronic non-exertional chest pain persists.  Current Medications (verified): 1)  Advair Diskus 250-50 Mcg/dose Aepb (Fluticasone-Salmeterol) .... One Puff Two Times A Day 2)  Albuterol Sulfate (2.5 Mg/41ml) 0.083% Nebu (Albuterol Sulfate) .... One Vial Nebulized Up To Four Times Per Day As Needed 3)  Fluticasone Propionate 50 Mcg/act Susp (Fluticasone Propionate) .... 2 Sprays in Each Nostril Daily 4)  Caltrate 600+d 600-400 Mg-Unit  Tabs (Calcium Carbonate-Vitamin D) .... Take 1 By Mouth Qd 5)  Aspirin 81 Mg .... Once Daily 6)  Norvasc 5 Mg  Tabs (Amlodipine Besylate) .... Take 1 By Mouth Qd 7)  Benicar Hct 40-25 Mg  Tabs (Olmesartan Medoxomil-Hctz) .... Take 1 By Mouth Once Daily Qd 8)  Lopressor 50 Mg  Tabs (Metoprolol Tartrate) .... Take 1 By Mouth Bid 9)  Cardizem Cd 240 Mg  Xr24h-Cap (Diltiazem Hcl Coated Beads) .... Qd 10)  Hydralazine Hcl 10 Mg  Tabs (Hydralazine Hcl) .... Tid 11)   Furosemide 80 Mg  Tabs (Furosemide) .... Take 1 By Mouth Two Times A Day 12)  Potassium Chloride Crys Cr 20 Meq  Tbcr (Potassium Chloride Crys Cr) .... Take 2 By Mouth Qd 13)  Simvastatin 80 Mg  Tabs (Simvastatin) .... Take 1 By Mouth Qd 14)  Zetia 10 Mg  Tabs (Ezetimibe) .... Take 1 By Mouth Qd 15)  Glucophage 500 Mg  Tabs (Metformin Hcl) .... Take 2 By Mouth Bid 16)  Humulin N 100 Unit/ml Susp (Insulin Isophane Human) .... 60 Units Each Am, and Syringes Once Daily 17)  Celexa 20 Mg  Tabs (Citalopram Hydrobromide) .... Take 1 By Mouth Qd 18)  Tramadol Hcl 50 Mg Tabs (Tramadol Hcl) .Marland Kitchen.. 1 Q4h As Needed Pain 19)  Lidoderm 5 %  Ptch (Lidocaine) .... Qd 20)  Omeprazole 40 Mg  Cpdr (Omeprazole) .Marland Kitchen.. 1 Each Day 30 Minutes Before Meal 21)  Metoclopramide Hcl 10 Mg  Tabs (Metoclopramide Hcl) .... Take 1 Tablet By Mouth 4 Times Daily. 14 Minutes Before [employer] and At Bedtime. 22)  Evista 60 Mg  Tabs (Raloxifene Hcl) .... Take 1 Q Am 23)  Lidex 0.05 %  Crea (Fluocinonide) .... Three Times A Day As Needed Itching  Disp 1 Med Tube 24)  Bd Insulin Syringe  Ultrafine 31g X 5/16" 0.3 Ml Misc (Insulin Syringe-Needle U-100) .... Use As Directed Once Daily  Allergies (verified): 1)  ! Phenergan  Past History:  Past Medical History: Last updated: 06/16/2009 Allergic rhinitis Asthma      - Spirometry 03/15/09 FEV1 1.35(61%), FVC 1.72(57%), FEV1% 78, +BD OSA      - PSG 05/08/09 AHI 11, RDI 21 Depression Diabetes mellitus, type II GERD Hypertension Hyperlipidemia Osteoarthritis Osteoporosis Hair Loss Arthritis Left CTS Non-Cardiogenic Morbid Obesity Mild Anemia w/u NEG Mild CAD CVA on MRI Diverticulosis Esophageal Stricture Gastritis, unspecified Gastroparesis Adenomatous Colon Polyps Sickle cell trait  Review of Systems  The patient denies syncope and fever.    Physical Exam  General:  morbidly obese.  no distress  Head:  head: no deformity eyes: no periorbital  swelling, no proptosis external nose and ears are normal mouth: no lesion seen Ears:  TM's intact and clear with normal canals with grossly normal hearing.   Neck:  Supple without thyroid enlargement or tenderness. No cervical lymphadenopathy Lungs:  Clear to auscultation bilaterally. Normal respiratory effort.  Heart:  Regular rate and rhythm without murmurs or gallops noted. Normal S1,S2.   Neurologic:  cn 2-12 grossly intact.   readily moves all 4's.    Additional Exam:  ecg: inverted p's (ectopic p axis)  Hemoglobin           [L]  10.4 g/dL                   12.0-15.0 Hematocrit           [L]  32.0 %     Hemoglobin A1C       [H]  9.4 %     Impression & Recommendations:  Problem # 1:  DIABETES MELLITUS, TYPE II (ICD-250.00) therapy limited by noncompliance with cbg's.  i'll do the best i can  Problem # 2:  CHEST PAIN UNSPECIFIED (ICD-786.50) chronic. w/u has been neg in the past, but last w/u was few years ago.  Problem # 3:  blurry vision and other assoc sxs.  poss related to poor control of dm, and/or #4  Problem # 4:  ANEMIA, MILD (N067566.9) Assessment: Deteriorated gi w/u was last neg few years ago  Other Orders: EKG w/ Interpretation (93000) Diabetic Clinic Referral (Diabetic) Cardiolite (Cardiolite) Radiology Referral (Radiology) TLB-BMP (Basic Metabolic Panel-BMET) (99991111) TLB-CBC Platelet - w/Differential (85025-CBCD) TLB-TSH (Thyroid Stimulating Hormone) (84443-TSH) TLB-IBC Pnl (Iron/FE;Transferrin) (83550-IBC) TLB-B12 + Folate Pnl ([id number]_82607-B12/FOL) TLB-A1C / Hgb A1C (Glycohemoglobin) (83036-A1C) TLB-Sedimentation Rate (ESR) (85652-ESR) Est. Patient Level IV [id number])  Patient Instructions: 1)  blood tests are being ordered for you today.  please call [phone] to hear your test results. 2)  check mri of the brain and adenosine heart test.  you will be called with a day and time for an appointment 3)  please see dr Arnoldo Morale for your pimary care  needs, so we can take care of just the diabetes here. 4)  continue nph, 60 units each am. 5)  check your blood sugar 2 times a day.  vary the time of day when you check, between before the 3 meals, and at bedtime.  also check if you have symptoms of your blood sugar being too high or too low.  please keep a record of the readings and bring it to your next appointment here.  please call us sooner if you are having low blood sugar episodes.  here are some papers to write your blood sugar on.  it is critically important that we be able to review your blood sugar record in order to safely treat your diabetes. 6)  Please schedule a follow-up appointment in 1 month. 7)  (update: i left message on phone-tree:  see addendum to lab report).

## 2010-02-22 NOTE — Progress Notes (Signed)
Summary: [facility]  Phone Note Outgoing Call   Summary of Call: Faxed completed paperwork to [facility] and sent a copy to be scanned.  Initial call taken by: Gardenia Phlegm RMA,  April 14, 2009 11:07 AM

## 2010-02-22 NOTE — Progress Notes (Signed)
Summary: [facility]  Phone Note Outgoing Call   Summary of Call: Faxed completed paperwork from [facility] and also sent a copy to be scanned. Initial call taken by: Gardenia Phlegm RMA,  April 25, 2009 1:40 PM     Appended Document: [facility] refaxed  Appended Document: nasal spray changed Medications Added FLUTICASONE PROPIONATE 50 MCG/ACT SUSP (FLUTICASONE PROPIONATE) 2 sprays in each nostril daily          Clinical Lists Changes  Medications: Changed medication from NASONEX 50 MCG/ACT SUSP (MOMETASONE FUROATE) two sprays once daily to FLUTICASONE PROPIONATE 50 MCG/ACT SUSP (FLUTICASONE PROPIONATE) 2 sprays in each nostril daily - Signed Rx of FLUTICASONE PROPIONATE 50 MCG/ACT SUSP (FLUTICASONE PROPIONATE) 2 sprays in each nostril daily;  #1 x 6;  Signed;  Entered by: Elita Boone CMA;  Authorized by: [name] MD;  Method used: Historical    Prescriptions: FLUTICASONE PROPIONATE 50 MCG/ACT SUSP (FLUTICASONE PROPIONATE) 2 sprays in each nostril daily  #1 x 6   Entered by:   Elita Boone CMA   Authorized by:   [name] MD   Signed by:   Elita Boone CMA on 05/10/2009   Method used:   Historical   [id number]   insurance will not cover the nasonex--changed to fluticasone with the same directions---this has been faxed back to the pharmacy for the pt and the pt is aware of change of meds. Elita Boone CMA  May 10, 2009 2:11 PM

## 2010-02-22 NOTE — Progress Notes (Signed)
  Phone Note Other Incoming   Caller: Anna from [facility] Request: Send information Summary of Call: Form sent twice and fax report showed busy/no signal Fax [phone] Initial call taken by: Damita Rhodie

## 2010-02-22 NOTE — Progress Notes (Signed)
Summary: Nuclear Pre-procedure  Phone Note Outgoing Call Call back at Home Phone [phone]   Call placed by: Eliezer Lofts, EMT-P,  July 04, 2009 3:25 PM Call placed to: Patient Action Taken: Phone Call Completed Summary of Call: Reviewed information on [employer] Information Sheet (see scanned document for further details).  Spoke with Patient.    Nuclear Med Background Indications for Stress Test: Evaluation for Ischemia   History: Asthma, Echo, Heart Catheterization, Myocardial Perfusion Study  History Comments: '05 Heart Cath: NL, EF=70% '09 MPS: EF= 61%, NL 1/11 Echo: EF= 55-65%  Symptoms: Chest Pain    Nuclear Pre-Procedure Cardiac Risk Factors: Family History - CAD, History of Smoking, Hypertension, IDDM Type 2, Lipids, Obesity Height (in): 64

## 2010-02-22 NOTE — Progress Notes (Signed)
Summary: HHRN?  Phone Note Other Incoming   Caller: Edison Simon RN w [facility] ([place]) [phone] Summary of Call: RN called to ask which Insulin pt is taking, 70/30 and sliding scale or Levermir. I advised RN that pt was switched to Levemir 60u qam last OV. RN also wanted to inform MD that pt has a large supply of several different Insulins in her home and she is worried that pt may get confused and take the wrong medication. She also wanted to report pt's CBGs have been in the lower 100s and upper 100s on just a few occasions. pt has been checking CBGs as instructed. Initial call taken by: Crissie Sickles, [place],  March 01, 2009 10:58 AM  Follow-up for Phone Call        continue same levemir.  for patient's safety, she should dispose of the other insulins.  please have pt keep appt with me in 2 days, and as her to bring cbg record. Follow-up by: Donavan Foil MD,  March 01, 2009 12:42 PM  Additional Follow-up for Phone Call Additional follow up Details #1::        unable to reach pt. I informed HHRN Additional Follow-up by: Crissie Sickles, [place],  March 01, 2009 2:33 PM

## 2010-02-22 NOTE — Progress Notes (Signed)
Summary: CPAP-lmtcb x 1   Phone Note From Other Clinic   Caller: tim with [facility] Call For: Dr Halford Chessman Summary of Call: Octavia Bruckner phoned stated Ms. Alberg is having a lot of problems with her CPAP and has actually requested that her machine be picked up and she wants to order it from a different company. She is currently with [employer]. States he isn't sure if she needs a new sleep study for this. Tim can be reached [phone] Initial call taken by: Ozella Rocks,  January 30, 2010 10:54 AM  Follow-up for Phone Call        [facility] for Tim TCB [place]  January 30, 2010 11:09 AM   See 02/01/09 phone note [place]  February 01, 2010 2:28 PM

## 2010-02-22 NOTE — Progress Notes (Signed)
Summary: [facility]  Phone Note Outgoing Call   Summary of Call: Faxed completed paperwork to [facility] and sent a copy to be scanned. Initial call taken by: Gardenia Phlegm RMA,  May 25, 2009 4:52 PM

## 2010-02-22 NOTE — Assessment & Plan Note (Signed)
Summary: one month follow up-lb   Vital Signs:  Patient profile:   65 year old female Height:      65 inches (165.10 cm) Weight:      289.50 pounds (131.59 kg) BMI:     48.35 O2 Sat:      95 % on Room air Temp:     98.5 degrees F (36.94 degrees C) oral Pulse rate:   88 / minute BP sitting:   122 / 74  (left arm) Cuff size:   large  Vitals Entered By: Rebeca Alert CMA Deborra Medina) (December 19, 2009 1:08 PM)  O2 Flow:  Room air CC: Follow-up visit/pt is no longer taking Z-Pak or Prednisone/aj Is Patient Diabetic? Yes   Referring Provider:  Renato Shin, Marijo File Primary Provider:  Jana Hakim, MD  CC:  Follow-up visit/pt is no longer taking Z-Pak or Prednisone/aj.  History of Present Illness: no cbg record, but states cbg's were well-controlled, until she needed prednisone.  the lowest was 60 (afternoon), but she does not know details other than that.    Current Medications (verified): 1)  Advair Diskus 250-50 Mcg/dose Aepb (Fluticasone-Salmeterol) .... One Puff Two Times A Day 2)  Albuterol Sulfate (2.5 Mg/18ml) 0.083% Nebu (Albuterol Sulfate) .... One Vial Nebulized Up To Four Times Per Day As Needed 3)  Fluticasone Propionate 50 Mcg/act Susp (Fluticasone Propionate) .... 2 Sprays in Each Nostril Daily 4)  Caltrate 600+d 600-400 Mg-Unit  Tabs (Calcium Carbonate-Vitamin D) .... Take 1 By Mouth Once Daily 5)  Aspirin 81 Mg Tabs (Aspirin) .... Once Daily 6)  Cardizem Cd 240 Mg  Xr24h-Cap (Diltiazem Hcl Coated Beads) .... Once Daily 7)  Hydralazine Hcl 10 Mg  Tabs (Hydralazine Hcl) .... Three Times A Day 8)  Furosemide 80 Mg  Tabs (Furosemide) .... Take 1 By Mouth Two Times A Day 9)  Potassium Chloride Crys Cr 20 Meq  Tbcr (Potassium Chloride Crys Cr) .... 2 By Mouth Two Times A Day 10)  Zetia 10 Mg  Tabs (Ezetimibe) .... Take 1 By Mouth Once Daily 11)  Humulin N 100 Unit/ml Susp (Insulin Isophane Human) .... 55 Units Each Am, and 5 Units in The Evening, and Syringes Two  Times A Day 12)  Celexa 20 Mg  Tabs (Citalopram Hydrobromide) .... Take 1 By Mouth Once Daily 13)  Tramadol Hcl 50 Mg Tabs (Tramadol Hcl) .Marland Kitchen.. 1 Q4h As Needed Pain 14)  Lidoderm 5 %  Ptch (Lidocaine) .... Once Daily 15)  Omeprazole 40 Mg  Cpdr (Omeprazole) .Marland Kitchen.. 1 Each Day 30 Minutes Before Meal 16)  Metoclopramide Hcl 10 Mg  Tabs (Metoclopramide Hcl) .... Take 1 Tablet By Mouth 4 Times Daily. 31 Minutes Before [employer] and At Bedtime. 17)  Evista 60 Mg  Tabs (Raloxifene Hcl) .... Take 1 Once Daily 18)  Lidex 0.05 %  Crea (Fluocinonide) .... Three Times A Day As Needed Itching  Disp 1 Med Tube 19)  Bd Insulin Syringe Ultrafine 31g X 5/16" 0.3 Ml Misc (Insulin Syringe-Needle U-100) .... Use As Directed Once Daily 20)  Neurontin 300 Mg Caps (Gabapentin) .Marland Kitchen.. 1 By Mouth Two Times A Day 21)  Metoprolol Tartrate 50 Mg Tabs (Metoprolol Tartrate) .Marland Kitchen.. 1 By Mouth Two Times A Day 22)  Multivitamins   Tabs (Multiple Vitamin) .Marland Kitchen.. 1 By Mouth Daily 23)  Lipitor 40 Mg Tabs (Atorvastatin Calcium) .... One Daily 24)  Zithromax Z-Pak 250 Mg Tabs (Azithromycin) .... Use As Directed 25)  Prednisone 10 Mg Tabs (Prednisone) .... 3 Pills  For 2 Days, 2 Pills For 2 Days, 1 Pill For 2 Days, 1/2 Pill For 2 Days  Allergies (verified): 1)  ! Phenergan  Past History:  Past Medical History: Last updated: 07/28/2009 Allergic rhinitis Asthma      - Spirometry 03/15/09 FEV1 1.35(61%), FVC 1.72(57%), FEV1% 78, +BD OSA      - PSG 05/08/09 AHI 11, RDI 21      - CPAP 9 cm H2O Depression Diabetes mellitus, type II since 1997 GERD Hypertension x years Hyperlipidemia x years Osteoarthritis Osteoporosis Hair Loss Arthritis Left CTS Non-Cardiogenic Morbid Obesity Mild Anemia w/u NEG Mild CAD  very minimal coronary disease with 20% obtuse   marginal stenosis. CVA on MRI Diverticulosis Esophageal Stricture Gastritis, unspecified Gastroparesis Adenomatous Colon Polyps Sickle cell trait  Review  of Systems  The patient denies syncope.    Physical Exam  General:  morbidly obese.  no distress  Neck:  Supple without thyroid enlargement or tenderness. No cervical lymphadenopathy   Impression & Recommendations:  Problem # 1:  DIABETES MELLITUS, TYPE II (ICD-250.00) therapy limited by pt's request for least expensive meds, and by need for a simple regimen.   Medications Added to Medication List This Visit: 1)  Humulin N 100 Unit/ml Susp (Insulin isophane human) .... 55 units each am, and 10 units in the evening, and syringes two times a day  Other Orders: Est. Patient Level III [id number]  Patient Instructions: 1)  check your blood sugar 2 times a day.  vary the time of day when you check, between before the 3 meals, and at bedtime.  also check if you have symptoms of your blood sugar being too high or too low.  please keep a record of the readings and bring it to your next appointment here.  please call us sooner if you are having low blood sugar episodes.  2)  increase nph insulin to 55 units am and 10 units in the evening. 3)  Please schedule a follow-up appointment in 3 months.   Orders Added: 1)  Est. Patient Level III [id number]

## 2010-02-22 NOTE — Medication Information (Signed)
Summary: [facility]  [facility]   Imported By: Phillis Knack 08/04/2009 10:43:41  _____________________________________________________________________  External Attachment:    Type:   Image     Comment:   External Document

## 2010-02-22 NOTE — Assessment & Plan Note (Signed)
Summary: 3 MTH FU---STC   Vital Signs:  Patient profile:   65 year old female Height:      64 inches (162.56 cm) Weight:      278.25 pounds (126.48 kg) O2 Sat:      94 % on Room air Temp:     98.0 degrees F (36.67 degrees C) oral Pulse rate:   106 / minute BP sitting:   142 / 82  (left arm) Cuff size:   large  Vitals Entered By: Gardenia Phlegm RMA (Jun 02, 2009 10:36 AM)  O2 Flow:  Room air CC: 3 month follow up/ CF Is Patient Diabetic? Yes   Referring Provider:  Renato Shin Primary Provider:  Sheliah Hatch  CC:  3 month follow up/ CF.  History of Present Illness: no cbg record, but states cbg's are often low (50's) before breakfast.  it is then higher as the day goes on.  pt states she feels well in general, except for fatigue.  Current Medications (verified): 1)  Albuterol Sulfate (2.5 Mg/24ml) 0.083% Nebu (Albuterol Sulfate) .... One Vial Nebulized Up To Four Times Per Day As Needed 2)  Fluticasone Propionate 50 Mcg/act Susp (Fluticasone Propionate) .... 2 Sprays in Each Nostril Daily 3)  Caltrate 600+d 600-400 Mg-Unit  Tabs (Calcium Carbonate-Vitamin D) .... Take 1 By Mouth Qd 4)  Simvastatin 80 Mg  Tabs (Simvastatin) .... Take 1 By Mouth Qd 5)  Norvasc 5 Mg  Tabs (Amlodipine Besylate) .... Take 1 By Mouth Qd 6)  Benicar Hct 40-25 Mg  Tabs (Olmesartan Medoxomil-Hctz) .... Take 1 By Mouth Once Daily Qd 7)  Celexa 20 Mg  Tabs (Citalopram Hydrobromide) .... Take 1 By Mouth Qd 8)  Omeprazole 40 Mg  Cpdr (Omeprazole) .Marland Kitchen.. 1 Each Day 30 Minutes Before Meal 9)  Zetia 10 Mg  Tabs (Ezetimibe) .... Take 1 By Mouth Qd 10)  Furosemide 80 Mg  Tabs (Furosemide) .... Take 1 By Mouth Two Times A Day 11)  Evista 60 Mg  Tabs (Raloxifene Hcl) .... Take 1 Q Am 12)  Potassium Chloride Crys Cr 20 Meq  Tbcr (Potassium Chloride Crys Cr) .... Take 2 By Mouth Qd 13)  Glucophage 500 Mg  Tabs (Metformin Hcl) .... Take 2 By Mouth Bid 14)  Lopressor 50 Mg  Tabs (Metoprolol Tartrate) .... Take 1  By Mouth Bid 15)  Lidoderm 5 %  Ptch (Lidocaine) .... Qd 16)  Metoclopramide Hcl 10 Mg  Tabs (Metoclopramide Hcl) .... Take 1 Tablet By Mouth 4 Times Daily. 44 Minutes Before [employer] and At Bedtime. 17)  Lidex 0.05 %  Crea (Fluocinonide) .... Three Times A Day As Needed Itching  Disp 1 Med Tube 18)  Cardizem Cd 240 Mg  Xr24h-Cap (Diltiazem Hcl Coated Beads) .... Qd 19)  Hydralazine Hcl 10 Mg  Tabs (Hydralazine Hcl) .... Tid 20)  Tramadol Hcl 50 Mg Tabs (Tramadol Hcl) .Marland Kitchen.. 1 Q4h As Needed Pain 21)  Levemir Flexpen 100 Unit/ml Soln (Insulin Detemir) .... 60 Units Qam 22)  Aspirin 81 Mg .... Once Daily 23)  Advair Diskus 250-50 Mcg/dose Aepb (Fluticasone-Salmeterol) .... One Puff Two Times A Day  Allergies (verified): 1)  ! Phenergan  Past History:  Past Medical History: Last updated: 04/18/2009 Allergic rhinitis Asthma      - Spirometry 03/15/09 FEV1 1.35(61%), FVC 1.72(57%), FEV1% 78, +BD Depression Diabetes mellitus, type II GERD Hypertension Hyperlipidemia Osteoarthritis Osteoporosis Hair Loss Arthritis Left CTS Non-Cardiogenic Morbid Obesity Mild Anemia w/u NEG Mild CAD CVA on MRI Diverticulosis  Esophageal Stricture Gastritis, unspecified Gastroparesis Adenomatous Colon Polyps Sickle cell trait  Review of Systems  The patient denies syncope.    Physical Exam  General:  obese.   Pulses:  dorsalis pedis intact bilat.  Extremities:  no deformity.  no ulcer on the feet.  feet are of normal color and temp.  no edema  Neurologic:  sensation is intact to touch on the feet    Impression & Recommendations:  Problem # 1:  DIABETES MELLITUS, TYPE II (ICD-250.00) therapy limited by pt's need for a simple regimen, and noncompliance with cbg's  Medications Added to Medication List This Visit: 1)  Humulin N 100 Unit/ml Susp (Insulin isophane human) .... 60 units each am, and syringes once daily  Other Orders: Est. Patient Level III  [id number])  Patient Instructions: 1)  change levemir to nph, 60 units each am. 2)  check your blood sugar 2 times a day.  vary the time of day when you check, between before the 3 meals, and at bedtime.  also check if you have symptoms of your blood sugar being too high or too low.  please keep a record of the readings and bring it to your next appointment here.  please call us sooner if you are having low blood sugar episodes.  here are some papers to write your blood sugar on. 3)  Please schedule a follow-up appointment in 1 month. Prescriptions: HUMULIN N 100 UNIT/ML SUSP (INSULIN ISOPHANE HUMAN) 60 units each am, and syringes once daily  #6 vials x 3   Entered and Authorized by:   Donavan Foil MD   Signed by:   Donavan Foil MD on 06/02/2009   Method used:   Faxed to ...       [facility] (retail)       [place]       [address]       [place], [place]  [address]       Ph: [phone]       Fax: [phone]   RxID:   [id number]

## 2010-02-22 NOTE — Progress Notes (Signed)
Summary: fax notes  Phone Note From Other Clinic Call back at [phone]   Caller: [facility] Call For: Halford Chessman Summary of Call: need copy of doctor's notes from 07/28/2009 appt showing that pt had a cpap follow-up fax to [phone] Attn: Christy Initial call taken by: Zigmund Gottron,  September 27, 2009 8:22 AM  Follow-up for Phone Call        Faxed notes.//Juanita Follow-up by: Netta Neat,  September 27, 2009 2:09 PM

## 2010-02-22 NOTE — Miscellaneous (Signed)
  Clinical Lists Changes  Observations: Added new observation of CARDCATHFIND: Coronaries:  The left main was normal.  The LAD had mid luminal irregularities.  The AV groove had luminal irregularities.  There was first obtuse marginal with moderate size and normal.  Ramus intermediate was large.  It was normal throughout its course.  Right coronary artery was a large dominant vessel.  It was normal throughout its course.  PDA was moderate sized and normal.   Left ventriculogram:  A left ventriculogram was obtained in the RAO projection.  The EF was 65% with normal wall motion.   CONCLUSION:  Normal coronaries.  Mildly elevated PA pressures.  Normal ventricular function.   PLAN:  No further cardiac workup is suggested.  The patient will continue with risk reduction.  The etiology of the chest discomfort in particularly the dyspnea is not clear though it could be related to deconditioning and obesity.  If this continues, we might suggest a primary pulmonary referral. (07/20/2009 10:38)      Cardiac Cath  Procedure date:  07/20/2009  Findings:      Coronaries:  The left main was normal.  The LAD had mid luminal irregularities.  The AV groove had luminal irregularities.  There was first obtuse marginal with moderate size and normal.  Ramus intermediate was large.  It was normal throughout its course.  Right coronary artery was a large dominant vessel.  It was normal throughout its course.  PDA was moderate sized and normal.   Left ventriculogram:  A left ventriculogram was obtained in the RAO projection.  The EF was 65% with normal wall motion.   CONCLUSION:  Normal coronaries.  Mildly elevated PA pressures.  Normal ventricular function.   PLAN:  No further cardiac workup is suggested.  The patient will continue with risk reduction.  The etiology of the chest discomfort in particularly the dyspnea is not clear though it could be related to deconditioning and obesity.  If  this continues, we might suggest a primary pulmonary referral.

## 2010-02-22 NOTE — Assessment & Plan Note (Signed)
Summary: 2 week follow up-lb   Vital Signs:  Patient profile:   65 year old female Height:      65 inches (165.10 cm) Weight:      283 pounds (128.64 kg) BMI:     47.26 O2 Sat:      94 % on Room air Temp:     98.8 degrees F (37.11 degrees C) oral Pulse rate:   103 / minute BP sitting:   132 / 76  (left arm) Cuff size:   large  Vitals Entered By: Rebeca Alert MA (November 16, 2009 1:06 PM)  O2 Flow:  Room air CC: 2 week F/U/pt's is not currently taking Metformin/aj Is Patient Diabetic? Yes   Referring Provider:  Renato Shin, Marijo File Primary Provider:  Donavan Foil MD  CC:  2 week F/U/pt's is not currently taking Metformin/aj.  History of Present Illness: the status of at least 3 ongoing medical problems is addressed today: pt was recently in the hospital for pneumonia:  she has lost 8 lbs, due to this.  she feels better now. dm:  she brings a record of her cbg's which i have reviewed today.  it varies from 50 (hs) to 400 (am).  no loc. renal insufficiency:  metformin was stopped in the hospital for this.  she has slight polyuria.   Current Medications (verified): 1)  Advair Diskus 250-50 Mcg/dose Aepb (Fluticasone-Salmeterol) .... One Puff Two Times A Day 2)  Albuterol Sulfate (2.5 Mg/86ml) 0.083% Nebu (Albuterol Sulfate) .... One Vial Nebulized Up To Four Times Per Day As Needed 3)  Fluticasone Propionate 50 Mcg/act Susp (Fluticasone Propionate) .... 2 Sprays in Each Nostril Daily 4)  Caltrate 600+d 600-400 Mg-Unit  Tabs (Calcium Carbonate-Vitamin D) .... Take 1 By Mouth Qd 5)  Aspirin 81 Mg .... Once Daily 6)  Norvasc 5 Mg  Tabs (Amlodipine Besylate) .... Take 1 By Mouth Qd 7)  Benicar Hct 40-25 Mg  Tabs (Olmesartan Medoxomil-Hctz) .... Take 1 By Mouth Once Daily Qd 8)  Lopressor 50 Mg  Tabs (Metoprolol Tartrate) .... Take 1 By Mouth Bid 9)  Cardizem Cd 240 Mg  Xr24h-Cap (Diltiazem Hcl Coated Beads) .... Qd 10)  Hydralazine Hcl 10 Mg  Tabs (Hydralazine Hcl) ....  Tid 11)  Furosemide 80 Mg  Tabs (Furosemide) .... Take 1 By Mouth Two Times A Day 12)  Potassium Chloride Crys Cr 20 Meq  Tbcr (Potassium Chloride Crys Cr) .... Take 2 By Mouth Qd 13)  Simvastatin 80 Mg  Tabs (Simvastatin) .... Take 1 By Mouth Qd 14)  Zetia 10 Mg  Tabs (Ezetimibe) .... Take 1 By Mouth Qd 15)  Glucophage 500 Mg  Tabs (Metformin Hcl) .... Take 2 By Mouth Bid 16)  Humulin N 100 Unit/ml Susp (Insulin Isophane Human) .... 60 Units Each Am, and Syringes Once Daily 17)  Celexa 20 Mg  Tabs (Citalopram Hydrobromide) .... Take 1 By Mouth Qd 18)  Tramadol Hcl 50 Mg Tabs (Tramadol Hcl) .Marland Kitchen.. 1 Q4h As Needed Pain 19)  Lidoderm 5 %  Ptch (Lidocaine) .... Qd 20)  Omeprazole 40 Mg  Cpdr (Omeprazole) .Marland Kitchen.. 1 Each Day 30 Minutes Before Meal 21)  Metoclopramide Hcl 10 Mg  Tabs (Metoclopramide Hcl) .... Take 1 Tablet By Mouth 4 Times Daily. 19 Minutes Before [employer] and At Bedtime. 22)  Evista 60 Mg  Tabs (Raloxifene Hcl) .... Take 1 Q Am 23)  Lidex 0.05 %  Crea (Fluocinonide) .... Three Times A Day As Needed Itching  Disp 1 Med Tube 24)  Bd Insulin Syringe Ultrafine 31g X 5/16" 0.3 Ml Misc (Insulin Syringe-Needle U-100) .... Use As Directed Once Daily  Allergies (verified): 1)  ! Phenergan  Past History:  Past Medical History: Last updated: 07/28/2009 Allergic rhinitis Asthma      - Spirometry 03/15/09 FEV1 1.35(61%), FVC 1.72(57%), FEV1% 78, +BD OSA      - PSG 05/08/09 AHI 11, RDI 21      - CPAP 9 cm H2O Depression Diabetes mellitus, type II since 1997 GERD Hypertension x years Hyperlipidemia x years Osteoarthritis Osteoporosis Hair Loss Arthritis Left CTS Non-Cardiogenic Morbid Obesity Mild Anemia w/u NEG Mild CAD  very minimal coronary disease with 20% obtuse   marginal stenosis. CVA on MRI Diverticulosis Esophageal Stricture Gastritis, unspecified Gastroparesis Adenomatous Colon Polyps Sickle cell trait  Review of Systems  The patient denies  dyspnea on exertion.         denies n/v  Physical Exam  General:  morbidly obese.  no distress  Lungs:  Clear to auscultation bilaterally. Normal respiratory effort.  Pulses:  dorsalis pedis intact bilat.  Extremities:  no deformity.  no ulcer on the feet.  feet are of normal color and temp.   trace right pedal edema and trace left pedal edema.   Neurologic:  sensation is intact to touch on the feet    Impression & Recommendations:  Problem # 1:  DIABETES MELLITUS, TYPE II (ICD-250.00) she needs some adjustment in her therapy  Problem # 2:  pneumonia improved  Problem # 3:  RENAL INSUFFICIENCY (ICD-588.9) this increases the risk of metformin  Medications Added to Medication List This Visit: 1)  Caltrate 600+d 600-400 Mg-unit Tabs (Calcium carbonate-vitamin d) .... Take 1 by mouth once daily 2)  Norvasc 5 Mg Tabs (Amlodipine besylate) .... Take 1 by mouth once daily 3)  Lopressor 50 Mg Tabs (Metoprolol tartrate) .... Take 1 by mouth two times a day 4)  Cardizem Cd 240 Mg Xr24h-cap (Diltiazem hcl coated beads) .... Once daily 5)  Hydralazine Hcl 10 Mg Tabs (Hydralazine hcl) .... Three times a day 6)  Potassium Chloride Crys Cr 20 Meq Tbcr (Potassium chloride crys cr) .... Take 2 by mouth once daily 7)  Simvastatin 80 Mg Tabs (Simvastatin) .... Take 1 by mouth once daily 8)  Zetia 10 Mg Tabs (Ezetimibe) .... Take 1 by mouth once daily 9)  Humulin N 100 Unit/ml Susp (Insulin isophane human) .... 55 units each am, and 5 units in the evening, and syringes two times a day 10)  Celexa 20 Mg Tabs (Citalopram hydrobromide) .... Take 1 by mouth once daily 11)  Lidoderm 5 % Ptch (Lidocaine) .... Once daily 12)  Evista 60 Mg Tabs (Raloxifene hcl) .... Take 1 once daily  Other Orders: Est. Patient Level IV [id number])  Patient Instructions: 1)  stay-off metformin for now 2)  change nph insulin to 55 units am and 5 units in the evening. 3)  Please schedule a follow-up appointment in 1  month.   Orders Added: 1)  Est. Patient Level IV [id number]

## 2010-02-22 NOTE — Miscellaneous (Signed)
Summary: Orders Update  Clinical Lists Changes  Orders: Added new Test order of T-2 View CXR ([id number]) - Signed 

## 2010-02-22 NOTE — Assessment & Plan Note (Signed)
Summary: 2 WK FU  STC   Vital Signs:  Patient profile:   65 year old female Height:      64 inches (162.56 cm) Weight:      277.50 pounds (126.14 kg) O2 Sat:      93 % on Room air Temp:     96.8 degrees F (36 degrees C) oral Pulse rate:   91 / minute BP sitting:   150 / 84  (left arm) Cuff size:   large  Vitals Entered By: Gardenia Phlegm CMA (March 03, 2009 10:38 AM)  O2 Flow:  Room air CC: 2 week follow up/ CF Is Patient Diabetic? Yes   Primary Provider:  Renato Shin, MD  CC:  2 week follow up/ CF.  History of Present Illness: she brings a record of her cbg's which i have reviewed today.  it varies from 81-500, with no trend throughout the day.  it is better overall since the recent change of her insulin.   multiple chronic sxs, including doe  Current Medications (verified): 1)  Caltrate 600+d 600-400 Mg-Unit  Tabs (Calcium Carbonate-Vitamin D) .... Take 1 By Mouth Qd 2)  Simvastatin 80 Mg  Tabs (Simvastatin) .... Take 1 By Mouth Qd 3)  Norvasc 5 Mg  Tabs (Amlodipine Besylate) .... Take 1 By Mouth Qd 4)  Benicar Hct 40-25 Mg  Tabs (Olmesartan Medoxomil-Hctz) .... Take 1 By Mouth Once Daily Qd 5)  Celexa 20 Mg  Tabs (Citalopram Hydrobromide) .... Take 1 By Mouth Qd 6)  Advair Diskus 100-50 Mcg/dose  Misc (Fluticasone-Salmeterol) .... 2 Puff Qd 7)  Omeprazole 40 Mg  Cpdr (Omeprazole) .Marland Kitchen.. 1 Each Day 30 Minutes Before Meal 8)  Zetia 10 Mg  Tabs (Ezetimibe) .... Take 1 By Mouth Qd 9)  Furosemide 80 Mg  Tabs (Furosemide) .... Take 1 By Mouth Two Times A Day 10)  Evista 60 Mg  Tabs (Raloxifene Hcl) .... Take 1 Q Am 11)  Potassium Chloride Crys Cr 20 Meq  Tbcr (Potassium Chloride Crys Cr) .... Take 2 By Mouth Qd 12)  Glucophage 500 Mg  Tabs (Metformin Hcl) .... Take 2 By Mouth Bid 13)  Lopressor 50 Mg  Tabs (Metoprolol Tartrate) .... Take 1 By Mouth Bid 14)  Lidoderm 5 %  Ptch (Lidocaine) .... Qd 15)  Metoclopramide Hcl 10 Mg  Tabs (Metoclopramide Hcl) .... Take 1 Tablet By  Mouth 4 Times Daily. 51 Minutes Before [employer] and At Bedtime. 16)  Lidex 0.05 %  Crea (Fluocinonide) .... Three Times A Day As Needed Itching  Disp 1 Med Tube 17)  Cardizem Cd 240 Mg  Xr24h-Cap (Diltiazem Hcl Coated Beads) .... Qd 18)  Hydralazine Hcl 10 Mg  Tabs (Hydralazine Hcl) .... Tid 19)  Tramadol Hcl 50 Mg Tabs (Tramadol Hcl) .Marland Kitchen.. 1 Q4h As Needed Pain 20)  Levemir Flexpen 100 Unit/ml Soln (Insulin Detemir) .... 60 Units Qam 21)  Aspirin 81 Mg .... Once Daily  Allergies (verified): 1)  ! Phenergan  Past History:  Past Medical History: Last updated: 07/09/2007 Allergic rhinitis Asthma Depression Diabetes mellitus, type II GERD Hypertension Osteoarthritis Osteoporosis hHair Loss Arthritis Left CTS Non-Cardiogenic Morbid Obesity Mild Anemia w/u NEG Mild CAD CVA on MRI Diverticulosis Esophageal Stricture Gastritis, unspecified Adenomatous Colon Polyps  Review of Systems       no change in chronic cough  Physical Exam  General:  morbidly obese.  no distress  Lungs:  Clear to auscultation bilaterally. Normal respiratory effort.    Impression & Recommendations:  Problem # 1:  DIABETES MELLITUS, TYPE II (ICD-250.00) therapy limited by pt's need for a simple regimen  Problem # 2:  ASTHMA (ICD-493.90) Assessment: Deteriorated  Medications Added to Medication List This Visit: 1)  Aspirin 81 Mg  .... Once daily  Other Orders: Pulmonary Referral (Pulmonary) Est. Patient Level III [id number])  Patient Instructions: 1)  continue levemir 60 units each am. 2)  check your blood sugar 2 times a day.  vary the time of day when you check, between before the 3 meals, and at bedtime.  also check if you have symptoms of your blood sugar being too high or too low.  please keep a record of the readings and bring it to your next appointment here.  please call us sooner if you are having low blood sugar episodes. 3)  Please schedule a follow-up appointment in 3  months. 4)  refer lung specialist.

## 2010-02-22 NOTE — Assessment & Plan Note (Signed)
Summary: ASTHMA///kp   Copy to:  Renato Shin Primary Provider/Referring Provider:  Sheliah Hatch  CC:  Pulmonary Consult.  History of Present Illness: 65 yo female for evaluation of dyspnea.  She was admitted to the hospital in January for chest pain.  Her cardiac evaluation was relatively unremarkable.  She was told that she may have asthma.  She has been using her inhalers, but does not feel like this is helping much with her breathing.  She feels problems with her breathing even at rest.  She has some cough with clear sputum.  She denies hemoptysis.  She gets chills, but denies fever.  She does wheeze on occasion, but some of this comes from her throat.  She will also get chest tightness.  She has sinus congestion, post-nasal drip, and globus sensation.  She is being treated for reflux and gastroparesis.  She denies history of pneumonia or TB.  She is not aware of having allergies.  She has a Programmer, systems, but denies other animal exposures.  She is from [place], and denies travel history.  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  She did work in a dry cleaner years ago.  She has been using advair for several months.  This helped at first, but doesn't seem to work as well now.  She thinks she was on a higher dose of advair before.  She has a nebulizer, and uses this four times per day.  This helps some, but doesn't seem to last very long.  She does c/o feeling sleepy, and snores at night.  Her husband also reports that she stops breathing while asleep.  She states that she had an overnight oxygen test recently, but has not heard the results yet.  She had an Echo in Jan. 2011 which showed normal LV function, but elevated pulmonary artery pressures.  CT of Chest  Procedure date:  01/31/2009  Findings:      Findings:  No filling defects in the pulmonary arteries to suggest   pulmonary emboli.  Heart is normal size.  Aorta is normal caliber.   No  dissection. No mediastinal, hilar, or axillary adenopathy.    Minimal right base atelectasis.  No pleural effusions.  No acute   bony abnormality.    Review of the MIP images confirms the above findings.    IMPRESSION:   No acute findings in the chest.  No evidence of pulmonary embolus.    Medications Prior to Update: 1)  Caltrate 600+d 600-400 Mg-Unit  Tabs (Calcium Carbonate-Vitamin D) .... Take 1 By Mouth Qd 2)  Simvastatin 80 Mg  Tabs (Simvastatin) .... Take 1 By Mouth Qd 3)  Norvasc 5 Mg  Tabs (Amlodipine Besylate) .... Take 1 By Mouth Qd 4)  Benicar Hct 40-25 Mg  Tabs (Olmesartan Medoxomil-Hctz) .... Take 1 By Mouth Once Daily Qd 5)  Celexa 20 Mg  Tabs (Citalopram Hydrobromide) .... Take 1 By Mouth Qd 6)  Advair Diskus 100-50 Mcg/dose  Misc (Fluticasone-Salmeterol) .... 2 Puff Qd 7)  Omeprazole 40 Mg  Cpdr (Omeprazole) .Marland Kitchen.. 1 Each Day 30 Minutes Before Meal 8)  Zetia 10 Mg  Tabs (Ezetimibe) .... Take 1 By Mouth Qd 9)  Furosemide 80 Mg  Tabs (Furosemide) .... Take 1 By Mouth Two Times A Day 10)  Evista 60 Mg  Tabs (Raloxifene Hcl) .... Take 1 Q Am 11)  Potassium Chloride Crys Cr 20 Meq  Tbcr (Potassium Chloride Crys Cr) .... Take  2 By Mouth Qd 12)  Glucophage 500 Mg  Tabs (Metformin Hcl) .... Take 2 By Mouth Bid 13)  Lopressor 50 Mg  Tabs (Metoprolol Tartrate) .... Take 1 By Mouth Bid 14)  Lidoderm 5 %  Ptch (Lidocaine) .... Qd 15)  Metoclopramide Hcl 10 Mg  Tabs (Metoclopramide Hcl) .... Take 1 Tablet By Mouth 4 Times Daily. 56 Minutes Before [employer] and At Bedtime. 16)  Lidex 0.05 %  Crea (Fluocinonide) .... Three Times A Day As Needed Itching  Disp 1 Med Tube 17)  Cardizem Cd 240 Mg  Xr24h-Cap (Diltiazem Hcl Coated Beads) .... Qd 18)  Hydralazine Hcl 10 Mg  Tabs (Hydralazine Hcl) .... Tid 19)  Tramadol Hcl 50 Mg Tabs (Tramadol Hcl) .Marland Kitchen.. 1 Q4h As Needed Pain 20)  Levemir Flexpen 100 Unit/ml Soln (Insulin Detemir) .... 60 Units Qam 21)  Aspirin 81 Mg .... Once  Daily  Allergies (verified): 1)  ! Phenergan  Past History:  Past Medical History: Allergic rhinitis Asthma Depression Diabetes mellitus, type II GERD Hypertension Hyperlipidemia Osteoarthritis Osteoporosis Hair Loss Arthritis Left CTS Non-Cardiogenic Morbid Obesity Mild Anemia w/u NEG Mild CAD CVA on MRI Diverticulosis Esophageal Stricture Gastritis, unspecified Gastroparesis Adenomatous Colon Polyps Sickle cell trait  Past Surgical History: Reviewed history from 08/09/2006 and no changes required. Hysterectomy ERD (08/08/2000) Left TKR (1998) EDG (06/25/2006) Cardiac Catherization (07/28/2003) Rest Cardiolite (07/12/2003) EKG (03/01/2005 DEXA (11/2005)  Family History: Family History of Colon Cancer: brother ? Family History of Liver Cancer: mother Family History of Diabetes: Mother,  2 sisters Family History of Heart Disease: mother, sisters, brother, father Family History of Kidney Disease: mother, 2 sisters allergies: sister father: sickle cell   Social History: pt is widowed. pt has children. pt is disabled. prev worked as a Electrical engineer.  Patient is a former smoker.  started at age 28.  2 ppd.  quit approx 1981. Daily Caffeine Use coffee and tea Alcohol Use - no Illicit Drug Use - no  Review of Systems       The patient complains of shortness of breath with activity, shortness of breath at rest, productive cough, chest pain, acid heartburn, weight change, abdominal pain, headaches, sneezing, ear ache, hand/feet swelling, and joint stiffness or pain.  The patient denies non-productive cough, coughing up blood, irregular heartbeats, indigestion, loss of appetite, difficulty swallowing, sore throat, tooth/dental problems, nasal congestion/difficulty breathing through nose, itching, anxiety, depression, rash, change in color of mucus, and fever.    Vital Signs:  Patient profile:   65 year old female Height:      64 inches Weight:      285.13  pounds BMI:     49.12 O2 Sat:      96 % on Room air Temp:     98.2 degrees F oral Pulse rate:   80 / minute BP sitting:   148 / 88  (left arm) Cuff size:   large  Vitals Entered By: Matthew Folks LPN (February 15, 624THL 3:13 PM)  O2 Flow:  Room air CC: Pulmonary Consult Comments Medications reviewed with patient Matthew Folks LPN  February 15, 624THL 3:13 PM    Physical Exam  General:  healthy appearing and obese.   Eyes:  PERRLA and EOMI.   Nose:  no deformity, discharge, inflammation, or lesions Mouth:  MP 3, no oral lesions Neck:  no JVD.   Chest Wall:  no deformities noted Lungs:  Clear to auscultation bilaterally. Normal respiratory effort.  Heart:  Regular rate and rhythm  without murmurs or gallops noted. Normal S1,S2.   Abdomen:  bowel sounds positive; abdomen soft and non-tender without masses, or organomegaly Extremities:  no clubbing, cyanosis, edema, or deformity noted Neurologic:  CN II-XII grossly intact with normal reflexes, coordination, muscle strength and tone Cervical Nodes:  no significant adenopathy Psych:  Alert and cooperative; normal mood and affect; normal attention span and concentration.     Impression & Recommendations:  Problem # 1:  DYSPNEA (ICD-786.05) This is likely multifactorial.  She carries a diagnosis of asthma with prior history of smoking.  She is obese, and likely has a component of deconditioning.  She also had elevation of her pulmonary artery pressures on recent Echo, and may have sleep disordered breathing.  She likely has some degree of upper airway irritation.  Problem # 2:  ASTHMA (ICD-493.90) She carries a diagnosis of asthma with prior history of smoking.  She has improvement in her symptoms to some degree with inhaler therapy.  I am concerned that her advair may be contributing to upper airway irritation.  I will stop her advair for now, and change her to pulmicort nebulizer.  She is to continue on as needed albuterol.  I will  also arrange for her to have pulmonary function testing.  She may need to have additional allergy testing, and possible methacholine challenge.  Problem # 3:  HYPERSOMNIA (ICD-780.54) She has symptoms suggestive of sleep apnea.  She states that she recently underwent an overnight oximetry with primary care.  I have asked a copy of this be sent for my review.  My clinical suspicion for sleep apnea is quite high to the point that even if her overnight oximetry is normal, I would still consider referring her for an overnight sleep test to further evaluate sleep apnea.  Problem # 4:  ALLERGIC RHINITIS (ICD-477.9) I think that some of her sinus symptoms and post-nasal drip are contributing to her upper airway irritation.  I will have her use nasal irrigation once daily followed by nasonex.  Depending on her response will determine if she needs further allergy tests, and/or imaging studies of her sinuses.  Problem # 5:  GASTROPARESIS (ICD-536.3) She is on pro-motility agents and anti-reflux agents.  Some of her upper airway irritation could be related to reflux.  May need further assessment of this if her upper airway symptoms persist.  Problem # 6:  PULMONARY HYPERTENSION (ICD-416.8) She had elevated pulmonary pressures during her recent hospitalization on Echo.  I suspect this is secondary to possible obstructive lung disease and sleep disordered breathing.  Would not pursue specific interventions for her pulmonary hypertension until her other respiratory conditions are better addressed.  Medications Added to Medication List This Visit: 1)  Nasonex 50 Mcg/act Susp (Mometasone furoate) .... Two sprays once daily 2)  Pulmicort 0.5 Mg/39ml Susp (Budesonide) .... One vial nebulized two times a day 3)  Albuterol Sulfate (2.5 Mg/28ml) 0.083% Nebu (Albuterol sulfate) .... One vial nebulized up to four times per day as needed  Complete Medication List: 1)  Caltrate 600+d 600-400 Mg-unit Tabs (Calcium  carbonate-vitamin d) .... Take 1 by mouth qd 2)  Simvastatin 80 Mg Tabs (Simvastatin) .... Take 1 by mouth qd 3)  Norvasc 5 Mg Tabs (Amlodipine besylate) .... Take 1 by mouth qd 4)  Benicar Hct 40-25 Mg Tabs (Olmesartan medoxomil-hctz) .... Take 1 by mouth once daily qd 5)  Celexa 20 Mg Tabs (Citalopram hydrobromide) .... Take 1 by mouth qd 6)  Omeprazole 40 Mg Cpdr (Omeprazole) .Marland KitchenMarland KitchenMarland Kitchen  1 each day 30 minutes before meal 7)  Zetia 10 Mg Tabs (Ezetimibe) .... Take 1 by mouth qd 8)  Furosemide 80 Mg Tabs (Furosemide) .... Take 1 by mouth two times a day 9)  Evista 60 Mg Tabs (Raloxifene hcl) .... Take 1 q am 10)  Potassium Chloride Crys Cr 20 Meq Tbcr (Potassium chloride crys cr) .... Take 2 by mouth qd 11)  Glucophage 500 Mg Tabs (Metformin hcl) .... Take 2 by mouth bid 12)  Lopressor 50 Mg Tabs (Metoprolol tartrate) .... Take 1 by mouth bid 13)  Lidoderm 5 % Ptch (Lidocaine) .... Qd 14)  Metoclopramide Hcl 10 Mg Tabs (Metoclopramide hcl) .... Take 1 tablet by mouth 4 times daily. 30 minutes before breakfast,lunch dinner and at bedtime. 15)  Lidex 0.05 % Crea (Fluocinonide) .... Three times a day as needed itching  disp 1 med tube 16)  Cardizem Cd 240 Mg Xr24h-cap (Diltiazem hcl coated beads) .... Qd 17)  Hydralazine Hcl 10 Mg Tabs (Hydralazine hcl) .... Tid 18)  Tramadol Hcl 50 Mg Tabs (Tramadol hcl) .Marland Kitchen.. 1 q4h as needed pain 19)  Levemir Flexpen 100 Unit/ml Soln (Insulin detemir) .... 60 units qam 20)  Aspirin 81 Mg  .... Once daily 21)  Nasonex 50 Mcg/act Susp (Mometasone furoate) .... Two sprays once daily 22)  Pulmicort 0.5 Mg/82ml Susp (Budesonide) .... One vial nebulized two times a day 23)  Albuterol Sulfate (2.5 Mg/83ml) 0.083% Nebu (Albuterol sulfate) .... One vial nebulized up to four times per day as needed  Other Orders: Consultation Level IV [id number]) Full Pulmonary Function Test (PFT)  Patient Instructions: 1)  Nasal irrigation (salt water spray for nose) once daily.  Then use  nasonex two sprays in each nostril once daily after nasal irrigation.  Do this daily until next visit. 2)  Stop advair 3)  Pulmicort nebulized two times a day  4)  Albuterol nebulized 4 times per day as needed for cough, wheeze, congestion, or shortness of breath 5)  Will schedule breathing test (PFT) 6)  Have Dr. Arnoldo Morale send copy of oxygen test 7)  Follow up in 6 to 8 weeks Prescriptions: PULMICORT 0.5 MG/2ML SUSP (BUDESONIDE) one vial nebulized two times a day  #60 x 2   Entered and Authorized by:   [name] MD   Signed by:   [name] MD on 03/07/2009   Method used:   Print then Give to Patient   [id number] NASONEX 50 MCG/ACT SUSP (MOMETASONE FUROATE) two sprays once daily  #1 x 3   Entered and Authorized by:   [name] MD   Signed by:   [name] MD on 03/07/2009   Method used:   Print then Give to Patient   RxID:   [id number]

## 2010-02-22 NOTE — Miscellaneous (Signed)
Summary: Auto CPAP report 06/28/09 to 07/11/09   Clinical Lists Changes Used on 11 of 14 nights with average 2hrs 35 min.  Optimal pressure 9 cm H2O with average AHI 7.  Will set pressure at 9 cm H2O.  Will have my nurse call to inform pt that CPAP report looks okay, but she needs to use the machine more. Orders: Added new Referral order of DME Referral (DME) - Signed  Appended Document: Auto CPAP report 06/28/09 to 07/11/09 LMOMTCB  Appended Document: Auto CPAP report 06/28/09 to 07/11/09 Pt is aware of CPAP setting and will try to use on a regular basis every night.

## 2010-02-22 NOTE — Medication Information (Signed)
Summary: Nasacort/[employer]  Nasacort/[employer]   Imported By: Phillis Knack 04/14/2009 12:11:16  _____________________________________________________________________  External Attachment:    Type:   Image     Comment:   External Document

## 2010-02-22 NOTE — Medication Information (Signed)
Summary: Glucose Supplies/[employer]  Glucose Supplies/[employer]   Imported By: Phillis Knack 08/15/2009 15:22:21  _____________________________________________________________________  External Attachment:    Type:   Image     Comment:   External Document

## 2010-02-22 NOTE — Miscellaneous (Signed)
Summary: Overnight oximetry on CPAP and room air   Clinical Lists Changes Test time 6hrs 3 min.  Mean SpO2 93%, nadir 83%.  Spent 31min 8sec (0.6%) with SpO2 < 88%.  Will have my nurse call to inform pt that oxygen level was okay with CPAP.  Appended Document: Overnight oximetry on CPAP and room air LMOMTCB  Appended Document: Overnight oximetry on CPAP and room air Patient is aware oxygen level looks okay with CPAP.

## 2010-02-22 NOTE — Assessment & Plan Note (Signed)
Summary: 6-8 weeks/apc   Visit Type:  Follow-up Copy to:  Renato Shin, [place] Primary Provider/Referring Provider:  Donavan Foil MD  CC:  OSA. CPAP follow-up.  The patient says she is still adjusting to CPAP. Wearing approx 5-6 hours everynight. She has lost the rubber piece that fits around the mask.Joann Gutierrez  History of Present Illness: 65 yo female with dyspnea with asthma, rhinitis with post-nasal drip, obesity, OSA on CPAP 9 cm and 2nd pulmonary hypertension.  She had recent auto titration and overnight oximetry.  She had good control of her OSA with CPAP 9 cm.  She has been using her machine more, and is not using it 5 to 6 hours per night.  She sleeps better, and does not snore.  She had recent right heart cath with following:  RESULTS:  Hemodynamics:  RA mean 11, RV 40/12 with a mean of 18, PA 37/14 with a mean of 27, pulmonary capillary pressure mean 15, cardiac output/cardiac index (Fick) 5.8/2.5, LV 154/26, AO 149/116. CONCLUSION:  Normal coronaries.  Mildly elevated PA pressures.  Normal ventricular function.  Her breathing has been okay.  She uses her nebulizer four times per day because that was what she thought she was supposed to do.  She does not have much cough or sputum.  She gets a wheeze  and globus in her throat.  She has been having more sinus congestion.  She has not been using her nasal spray on a regular basis.   Current Medications (verified): 1)  Advair Diskus 250-50 Mcg/dose Aepb (Fluticasone-Salmeterol) .... One Puff Two Times A Day 2)  Albuterol Sulfate (2.5 Mg/31ml) 0.083% Nebu (Albuterol Sulfate) .... One Vial Nebulized Up To Four Times Per Day As Needed 3)  Fluticasone Propionate 50 Mcg/act Susp (Fluticasone Propionate) .... 2 Sprays in Each Nostril Daily 4)  Caltrate 600+d 600-400 Mg-Unit  Tabs (Calcium Carbonate-Vitamin D) .... Take 1 By Mouth Qd 5)  Aspirin 81 Mg .... Once Daily 6)  Norvasc 5 Mg  Tabs (Amlodipine Besylate) .... Take 1 By Mouth  Qd 7)  Benicar Hct 40-25 Mg  Tabs (Olmesartan Medoxomil-Hctz) .... Take 1 By Mouth Once Daily Qd 8)  Lopressor 50 Mg  Tabs (Metoprolol Tartrate) .... Take 1 By Mouth Bid 9)  Cardizem Cd 240 Mg  Xr24h-Cap (Diltiazem Hcl Coated Beads) .... Qd 10)  Hydralazine Hcl 10 Mg  Tabs (Hydralazine Hcl) .... Tid 11)  Furosemide 80 Mg  Tabs (Furosemide) .... Take 1 By Mouth Two Times A Day 12)  Potassium Chloride Crys Cr 20 Meq  Tbcr (Potassium Chloride Crys Cr) .... Take 2 By Mouth Qd 13)  Simvastatin 80 Mg  Tabs (Simvastatin) .... Take 1 By Mouth Qd 14)  Zetia 10 Mg  Tabs (Ezetimibe) .... Take 1 By Mouth Qd 15)  Glucophage 500 Mg  Tabs (Metformin Hcl) .... Take 2 By Mouth Bid 16)  Humulin N 100 Unit/ml Susp (Insulin Isophane Human) .... 60 Units Each Am, and Syringes Once Daily 17)  Celexa 20 Mg  Tabs (Citalopram Hydrobromide) .... Take 1 By Mouth Qd 18)  Tramadol Hcl 50 Mg Tabs (Tramadol Hcl) .Joann Gutierrez.. 1 Q4h As Needed Pain 19)  Lidoderm 5 %  Ptch (Lidocaine) .... Qd 20)  Omeprazole 40 Mg  Cpdr (Omeprazole) .Joann Gutierrez.. 1 Each Day 30 Minutes Before Meal 21)  Metoclopramide Hcl 10 Mg  Tabs (Metoclopramide Hcl) .... Take 1 Tablet By Mouth 4 Times Daily. 22 Minutes Before [employer] and At Bedtime. 22)  Evista 60 Mg  Tabs (Raloxifene Hcl) .... Take 1 Q Am 23)  Lidex 0.05 %  Crea (Fluocinonide) .... Three Times A Day As Needed Itching  Disp 1 Med Tube 24)  Bd Insulin Syringe Ultrafine 31g X 5/16" 0.3 Ml Misc (Insulin Syringe-Needle U-100) .... Use As Directed Once Daily  Allergies (verified): 1)  ! Phenergan  Past History:  Past Medical History: Allergic rhinitis Asthma      - Spirometry 03/15/09 FEV1 1.35(61%), FVC 1.72(57%), FEV1% 78, +BD OSA      - PSG 05/08/09 AHI 11, RDI 21      - CPAP 9 cm H2O Depression Diabetes mellitus, type II since 1997 GERD Hypertension x years Hyperlipidemia x years Osteoarthritis Osteoporosis Hair Loss Arthritis Left CTS Non-Cardiogenic Morbid Obesity Mild  Anemia w/u NEG Mild CAD  very minimal coronary disease with 20% obtuse   marginal stenosis. CVA on MRI Diverticulosis Esophageal Stricture Gastritis, unspecified Gastroparesis Adenomatous Colon Polyps Sickle cell trait  Past Surgical History: Reviewed history from 07/17/2009 and no changes required. Hysterectomy ERD (08/08/2000) Left TKR (1998) EDG (06/25/2006) DEXA (11/2005) Lumpectomies (benign)  Vital Signs:  Patient profile:   65 year old female Height:      65 inches (165.10 cm) Weight:      285 pounds (129.55 kg) BMI:     47.60 O2 Sat:      91 % on Room air Temp:     98.0 degrees F (36.67 degrees C) oral Pulse rate:   95 / minute BP sitting:   124 / 80  (left arm) Cuff size:   large  Vitals Entered By: Francesca Jewett CMA (July 28, 2009 1:34 PM)  O2 Sat at Rest %:  91 O2 Flow:  Room air CC: OSA. CPAP follow-up.  The patient says she is still adjusting to CPAP. Wearing approx 5-6 hours everynight. She has lost the rubber piece that fits around the mask. Comments Medications reviewed. Daytime phone verified. Francesca Jewett [facility]  July 28, 2009 1:41 PM   Physical Exam  General:  healthy appearing and obese.   Nose:  no deformity, discharge, inflammation, or lesions Mouth:  MP 3, no oral lesions Neck:  no JVD.   Lungs:  diminished breath sounds, no wheezing Heart:  Regular rate and rhythm without murmurs or gallops noted.  Extremities:  no clubbing, cyanosis, edema, or deformity noted Cervical Nodes:  no significant adenopathy   Impression & Recommendations:  Problem # 1:  OBSTRUCTIVE SLEEP APNEA (ICD-327.23)  She is to continue on CPAP 9 cm H2O.  Encouraged her to start a weight loss program.  Explained there are no pulmonary contra-indications to starting an exercise program.  Problem # 2:  ASTHMA (ICD-493.90)  Continue advair 250/50 one puff bid, and as needed albuterol.  Problem # 3:  ALLERGIC RHINITIS (ICD-477.9)  She is to continue on her sinus  regimen.  Problem # 4:  PULMONARY HYPERTENSION (ICD-416.8)  This was mild on recent right heart cath.    Problem # 5:  DYSPNEA (ICD-786.05) Explained that this is most likely related to her obesity and deconditioning.  She is to start a gradual exercise program.  Complete Medication List: 1)  Advair Diskus 250-50 Mcg/dose Aepb (Fluticasone-salmeterol) .... One puff two times a day 2)  Albuterol Sulfate (2.5 Mg/55ml) 0.083% Nebu (Albuterol sulfate) .... One vial nebulized up to four times per day as needed 3)  Fluticasone Propionate 50 Mcg/act Susp (Fluticasone propionate) .... 2 sprays in each nostril daily 4)  Caltrate 600+d  600-400 Mg-unit Tabs (Calcium carbonate-vitamin d) .... Take 1 by mouth qd 5)  Aspirin 81 Mg  .... Once daily 6)  Norvasc 5 Mg Tabs (Amlodipine besylate) .... Take 1 by mouth qd 7)  Benicar Hct 40-25 Mg Tabs (Olmesartan medoxomil-hctz) .... Take 1 by mouth once daily qd 8)  Lopressor 50 Mg Tabs (Metoprolol tartrate) .... Take 1 by mouth bid 9)  Cardizem Cd 240 Mg Xr24h-cap (Diltiazem hcl coated beads) .... Qd 10)  Hydralazine Hcl 10 Mg Tabs (Hydralazine hcl) .... Tid 11)  Furosemide 80 Mg Tabs (Furosemide) .... Take 1 by mouth two times a day 12)  Potassium Chloride Crys Cr 20 Meq Tbcr (Potassium chloride crys cr) .... Take 2 by mouth qd 13)  Simvastatin 80 Mg Tabs (Simvastatin) .... Take 1 by mouth qd 14)  Zetia 10 Mg Tabs (Ezetimibe) .... Take 1 by mouth qd 15)  Glucophage 500 Mg Tabs (Metformin hcl) .... Take 2 by mouth bid 16)  Humulin N 100 Unit/ml Susp (Insulin isophane human) .... 60 units each am, and syringes once daily 17)  Celexa 20 Mg Tabs (Citalopram hydrobromide) .... Take 1 by mouth qd 18)  Tramadol Hcl 50 Mg Tabs (Tramadol hcl) .Joann Gutierrez.. 1 q4h as needed pain 19)  Lidoderm 5 % Ptch (Lidocaine) .... Qd 20)  Omeprazole 40 Mg Cpdr (Omeprazole) .Joann Gutierrez.. 1 each day 30 minutes before meal 21)  Metoclopramide Hcl 10 Mg Tabs (Metoclopramide hcl) .... Take 1 tablet by  mouth 4 times daily. 30 minutes before breakfast,lunch dinner and at bedtime. 22)  Evista 60 Mg Tabs (Raloxifene hcl) .... Take 1 q am 23)  Lidex 0.05 % Crea (Fluocinonide) .... Three times a day as needed itching  disp 1 med tube 24)  Bd Insulin Syringe Ultrafine 31g X 5/16" 0.3 Ml Misc (Insulin syringe-needle u-100) .... Use as directed once daily  Other Orders: Est. Patient Level III [id number])  Patient Instructions: 1)  advair one puff two times a day 2)  albuterol nebulizer upto four times per day as needed for cough, wheeze, congestion, or shortness of breath 3)  Follow up in 4 to 6 months

## 2010-02-22 NOTE — Assessment & Plan Note (Signed)
Summary: rov/mj  Medications Added POTASSIUM CHLORIDE CRYS CR 20 MEQ  TBCR (POTASSIUM CHLORIDE CRYS CR) 2 by mouth two times a day NEURONTIN 300 MG CAPS (GABAPENTIN) 1 by mouth two times a day METOPROLOL TARTRATE 50 MG TABS (METOPROLOL TARTRATE) 1 by mouth two times a day MULTIVITAMINS   TABS (MULTIPLE VITAMIN) 1 by mouth daily LIPITOR 40 MG TABS (ATORVASTATIN CALCIUM) one daily      Allergies Added:   Visit Type:  Follow-up Primary Provider:  Jana Hakim, MD  CC:  Dyspnea.  History of Present Illness: The patient presents for followup of dyspnea. I had evaluated her this year for this and did not suffer a cardiac etiology. Right heart catheterization demonstrated only mildly elevated pulmonary pressures. She had minimal coronary plaque, normal left ventricular systolic function and a low BNP. She was hospitalized with apparent and pneumonia and I reviewed these records. She was told to come back to see all of her doctors. However, from the hospital records I can see no mention of any cardiac problems. She says they "got fluid off of me". However, her chest x-ray demonstrated no edema and her BNP was again normal. She did have some renal insufficiency and so some of her medicines were felt she currently says she still having the same dyspnea she had previously. She's not having any new chest pain. She's not having the symptoms she had with her previous pneumonia.  Current Medications (verified): 1)  Advair Diskus 250-50 Mcg/dose Aepb (Fluticasone-Salmeterol) .... One Puff Two Times A Day 2)  Albuterol Sulfate (2.5 Mg/63ml) 0.083% Nebu (Albuterol Sulfate) .... One Vial Nebulized Up To Four Times Per Day As Needed 3)  Fluticasone Propionate 50 Mcg/act Susp (Fluticasone Propionate) .... 2 Sprays in Each Nostril Daily 4)  Caltrate 600+d 600-400 Mg-Unit  Tabs (Calcium Carbonate-Vitamin D) .... Take 1 By Mouth Once Daily 5)  Aspirin 81 Mg .... Once Daily 6)  Norvasc 5 Mg  Tabs (Amlodipine  Besylate) .... Take 1 By Mouth Once Daily 7)  Cardizem Cd 240 Mg  Xr24h-Cap (Diltiazem Hcl Coated Beads) .... Once Daily 8)  Hydralazine Hcl 10 Mg  Tabs (Hydralazine Hcl) .... Three Times A Day 9)  Furosemide 80 Mg  Tabs (Furosemide) .... Take 1 By Mouth Two Times A Day 10)  Potassium Chloride Crys Cr 20 Meq  Tbcr (Potassium Chloride Crys Cr) .... 2 By Mouth Two Times A Day 11)  Simvastatin 80 Mg  Tabs (Simvastatin) .... Take 1 By Mouth Once Daily 12)  Zetia 10 Mg  Tabs (Ezetimibe) .... Take 1 By Mouth Once Daily 13)  Humulin N 100 Unit/ml Susp (Insulin Isophane Human) .... 55 Units Each Am, and 5 Units in The Evening, and Syringes Two Times A Day 14)  Celexa 20 Mg  Tabs (Citalopram Hydrobromide) .... Take 1 By Mouth Once Daily 15)  Tramadol Hcl 50 Mg Tabs (Tramadol Hcl) .Marland Kitchen.. 1 Q4h As Needed Pain 16)  Lidoderm 5 %  Ptch (Lidocaine) .... Once Daily 17)  Omeprazole 40 Mg  Cpdr (Omeprazole) .Marland Kitchen.. 1 Each Day 30 Minutes Before Meal 18)  Metoclopramide Hcl 10 Mg  Tabs (Metoclopramide Hcl) .... Take 1 Tablet By Mouth 4 Times Daily. 80 Minutes Before [employer] and At Bedtime. 19)  Evista 60 Mg  Tabs (Raloxifene Hcl) .... Take 1 Once Daily 20)  Lidex 0.05 %  Crea (Fluocinonide) .... Three Times A Day As Needed Itching  Disp 1 Med Tube 21)  Bd Insulin Syringe Ultrafine 31g X 5/16" 0.3  Ml Misc (Insulin Syringe-Needle U-100) .... Use As Directed Once Daily 22)  Neurontin 300 Mg Caps (Gabapentin) .Marland Kitchen.. 1 By Mouth Two Times A Day 23)  Metoprolol Tartrate 50 Mg Tabs (Metoprolol Tartrate) .Marland Kitchen.. 1 By Mouth Two Times A Day 24)  Multivitamins   Tabs (Multiple Vitamin) .Marland Kitchen.. 1 By Mouth Daily  Allergies (verified): 1)  ! Phenergan  Past History:  Past Medical History: Reviewed history from 07/28/2009 and no changes required. Allergic rhinitis Asthma      - Spirometry 03/15/09 FEV1 1.35(61%), FVC 1.72(57%), FEV1% 78, +BD OSA      - PSG 05/08/09 AHI 11, RDI 21      - CPAP 9 cm  H2O Depression Diabetes mellitus, type II since 1997 GERD Hypertension x years Hyperlipidemia x years Osteoarthritis Osteoporosis Hair Loss Arthritis Left CTS Non-Cardiogenic Morbid Obesity Mild Anemia w/u NEG Mild CAD  very minimal coronary disease with 20% obtuse   marginal stenosis. CVA on MRI Diverticulosis Esophageal Stricture Gastritis, unspecified Gastroparesis Adenomatous Colon Polyps Sickle cell trait  Past Surgical History: Reviewed history from 07/17/2009 and no changes required. Hysterectomy ERD (08/08/2000) Left TKR (1998) EDG (06/25/2006) DEXA (11/2005) Lumpectomies (benign)  Review of Systems       As stated in the HPI and negative for all other systems.   Vital Signs:  Patient profile:   65 year old female Height:      65 inches Weight:      289 pounds BMI:     48.27 Pulse rate:   80 / minute Resp:     18 per minute BP sitting:   138 / 80  (right arm)  Vitals Entered By: Levora Angel, CNA (November 28, 2009 2:00 PM)  Physical Exam  General:  Well developed, well nourished, in no acute distress, morbidly obese Head:  normocephalic and atraumatic Neck:  Neck supple, no JVD. No masses, thyromegaly or abnormal cervical nodes. Chest Wall:  no deformities or breast masses noted Lungs:  Clear bilaterally to auscultation and percussion. Heart:  Non-displaced PMI, chest non-tender; regular rate and rhythm, S1, S2 without murmurs, rubs or gallops. Carotid upstroke normal, no bruit. Normal abdominal aortic size, no bruits. Femorals normal pulses, no bruits. Pedals normal pulses. No edema, no varicosities. Abdomen:  Bowel sounds positive; abdomen soft and non-tender without masses, organomegaly, or hernias noted. No hepatosplenomegaly. Msk:  Back normal, normal gait. Muscle strength and tone normal. Extremities:  No clubbing or cyanosis. Neurologic:  Alert and oriented x 3. Skin:  Intact without lesions or rashes. Cervical Nodes:  no significant  adenopathy Axillary Nodes:  no significant adenopathy Inguinal Nodes:  no significant adenopathy Psych:  Normal affect.   EKG  Procedure date:  11/28/2009  Findings:      Sinus rhythm, rate 81, axis within normal limits, intervals within normal limits, no acute ST-T wave changes  Impression & Recommendations:  Problem # 1:  DYSPNEA (ICD-786.05) I do not see any cardiac etiology to this and would not suggest further cardiovascular testing. This is probably multifactorial with morbid obesity playing some role. Orders: EKG w/ Interpretation (93000)  Problem # 2:  HYPERTENSION (ICD-401.9) She is on both Norvasc and Cardizem. I will discontinue the Norvasc that she will need to have her blood pressure followed closely as she will likely need further med titration.  Problem # 3:  HYPERCHOLESTEROLEMIA (ICD-272.0) She should not be on simvastatin plus the Cardizem. She was on simvastatin 80. I will switch this to Lipitor 40. She should have  her labs drawn by her primary physician in about 8 weeks.  Patient Instructions: 1)  Your physician recommends that you schedule a follow-up appointment as needed 2)  Your physician has recommended you make the following change in your medication: Stop Norvasc and stop simvastatin.  start Lipitor 40 mg daily Prescriptions: LIPITOR 40 MG TABS (ATORVASTATIN CALCIUM) one daily  #30 x 11   Entered by:   Sim Boast, RN   Authorized by:   Minus Breeding, MD, [facility]   Signed by:   Sim Boast, RN on 11/28/2009   Method used:   Faxed to ...       [facility] (retail)       [address]       [place], [place]  [address]       Ph: [phone]       Fax: [phone]   RxID:   [id number]  I have reviewed and approved all prescriptions at the time of this visit Minus Breeding, MD, [facility]  November 28, 2009 2:24 PM

## 2010-02-22 NOTE — Progress Notes (Signed)
Summary: metoprolol  Phone Note Refill Request Message from:  Fax from Pharmacy on February 14, 2010 4:09 PM  Refills Requested: Medication #1:  METOPROLOL TARTRATE 50 MG TABS 1 by mouth two times a day  Method Requested: Electronic Initial call taken by: Tomma Lightning RMA,  February 14, 2010 4:09 PM    Prescriptions: METOPROLOL TARTRATE 50 MG TABS (METOPROLOL TARTRATE) 1 by mouth two times a day  #60 x 5   Entered by:   Tomma Lightning RMA   Authorized by:   Donavan Foil MD   Signed by:   Tomma Lightning RMA on 02/14/2010   Method used:   Faxed to ...       [facility] (retail)       [place]       [address]       [place], [place]  [address]       Ph: [phone]       Fax: [phone]   RxID:   [id number]

## 2010-02-22 NOTE — Procedures (Signed)
Summary: [facility]  [facility]   Imported By: Phillis Knack 08/07/2009 11:52:49  _____________________________________________________________________  External Attachment:    Type:   Image     Comment:   External Document

## 2010-02-22 NOTE — Miscellaneous (Signed)
Summary: CPAP download 06/28/09 to 09/30/09   Clinical Lists Changes Used on 62 of 95 nights with average 1 hr 56 min.  With CPAP at 9 cm H2O average AHI was 8.

## 2010-02-22 NOTE — Miscellaneous (Signed)
Summary: Pulmonary function test    Pulmonary Function Test Date: 03/15/2009 Height (in.): 64 Gender: Female  Pre-Spirometry FVC    Value: 1.54 L/min   Pred: 3.00 L/min     % Pred: 51 % FEV1    Value: 1.21 L     Pred: 2.19 L     % Pred: 55 % FEV1/FVC  Value: 79 %     Pred: 73 %     % Pred: . % FEF 25-75  Value: 1.18 L/min   Pred: 2.50 L/min     % Pred: 47 %  Post-Spirometry FVC    Value: 1.72 L/min   Pred: 3.00 L/min     % Pred: 57 % FEV1    Value: 1.35 L     Pred: 2.19 L     % Pred: 61 % FEV1/FVC  Value: 78 %     Pred: 73 %     % Pred: . % FEF 25-75  Value: 1.27 L/min   Pred: 2.50 L/min     % Pred: 51 %  Lung Volumes TLC    Value: 4.88 L   % Pred: 100 % RV    Value: 3.34 L   % Pred: 178 % DLCO    Value: 10.3 %   % Pred: 36 % DLCO/VA  Value: 4.04 %   % Pred: 109 %  Comments: Poor test performance.  Leak with lung volumes.  Bronchodilator responsiveness based on change in FVC.  Severe diffusion defect.  Clinical Lists Changes  Observations: Added new observation of PFT COMMENTS: Poor test performance.  Leak with lung volumes.  Bronchodilator responsiveness based on change in FVC.  Severe diffusion defect. (03/15/2009 16:57) Added new observation of DLCO/VA%EXP: 109 % (03/15/2009 16:57) Added new observation of DLCO/VA: 4.04 % (03/15/2009 16:57) Added new observation of DLCO % EXPEC: 36 % (03/15/2009 16:57) Added new observation of DLCO: 10.3 % (03/15/2009 16:57) Added new observation of RV % EXPECT: 178 % (03/15/2009 16:57) Added new observation of RV: 3.34 L (03/15/2009 16:57) Added new observation of TLC % EXPECT: 100 % (03/15/2009 16:57) Added new observation of TLC: 4.88 L (03/15/2009 16:57) Added new observation of FEF2575%EXPS: 51 % (03/15/2009 16:57) Added new observation of PSTFEF25/75P: 2.50  (03/15/2009 16:57) Added new observation of PSTFEF25/75%: 1.27 L/min (03/15/2009 16:57) Added new observation of PSTFEV1/FCV%: . % (03/15/2009 16:57) Added new observation of  FEV1FVCPRDPS: 73 % (03/15/2009 16:57) Added new observation of PSTFEV1/FVC: 78 % (03/15/2009 16:57) Added new observation of POSTFEV1%PRD: 61 % (03/15/2009 16:57) Added new observation of FEV1PRDPST: 2.19 L (03/15/2009 16:57) Added new observation of POST FEV1: 1.35 L/min (03/15/2009 16:57) Added new observation of POST FVC%EXP: 57 % (03/15/2009 16:57) Added new observation of FVCPRDPST: 3.00 L/min (03/15/2009 16:57) Added new observation of POST FVC: 1.72 L (03/15/2009 16:57) Added new observation of FEF % EXPEC: 47 % (03/15/2009 16:57) Added new observation of FEF25-75%PRE: 2.50 L/min (03/15/2009 16:57) Added new observation of FEF 25-75%: 1.18 L/min (03/15/2009 16:57) Added new observation of FEV1/FVC%EXP: . % (03/15/2009 16:57) Added new observation of FEV1/FVC PRE: 73 % (03/15/2009 16:57) Added new observation of FEV1/FVC: 79 % (03/15/2009 16:57) Added new observation of FEV1 % EXP: 55 % (03/15/2009 16:57) Added new observation of FEV1 PREDICT: 2.19 L (03/15/2009 16:57) Added new observation of FEV1: 1.21 L (03/15/2009 16:57) Added new observation of FVC % EXPECT: 51 % (03/15/2009 16:57) Added new observation of FVC PREDICT: 3.00 L (03/15/2009 16:57) Added new observation of FVC: 1.54 L (03/15/2009 16:57)  Added new observation of PFT HEIGHT: 64  (03/15/2009 16:57) Added new observation of PFT DATE: 03/15/2009  (03/15/2009 16:57)

## 2010-02-22 NOTE — Progress Notes (Signed)
Summary: [facility]  Phone Note Other Incoming   Summary of Call: Received paperwork from [facility]. Put on MD's desk. Initial call taken by: Gardenia Phlegm CMA,  February 27, 2009 10:35 AM     Appended Document: [facility] Faxed completed paperwork and sent a copy to be scanned.

## 2010-02-22 NOTE — Letter (Signed)
Summary: Cardiac Catheterization Instructions- [place], [place]  [id number] [place]   [place], [place] [address]   Phone: [phone]  Fax: [phone]     07/17/2009 MRN: [id number]  NORMANDIE BLANKS [place], [place]  [id number]  Dear Ms. Matsumoto,   You are scheduled for a Cardiac Catheterization on Thursday June 30 with Dr. Percival Spanish.  Please arrive to the 1st floor of the Heart and V[facility] Center at [facility] at 8:30am  on the day of your procedure. Please do not arrive before 6:30 a.m. Call the Heart and Vascular Center at [phone] if you are unable to make your appointmnet. The Code to get into the parking garage under the building is 0100. Take the elevators to the 1st floor. You must have someone to drive you home. Someone must be with you for the first 24 hours after you arrive home. Please wear clothes that are easy to get on and off and wear slip-on shoes. Do not eat or drink after midnight except water with your medications that morning. Bring all your medications and current insurance cards with you.  _x__ DO NOT take these medications before your procedure:   DO NOT TAKE GLUCOPHAGE THE MORNING OF THE PROCEDURE AND FOR 48 HOURS AFTERWARDS.  TAKE ONE-HALF OF YOU USUAL INSULIN DOSE THE MORNING OF THE PROCEDURE.  _x__ Make sure you take your aspirin.  _x__ You may take ALL of your other  medications with water that morning.   The usual length of stay after your procedure is 2 to 3 hours. This can vary.  If you have any questions, please call the office at the number listed above.   Desiree Lucy, RN, BSN

## 2010-02-22 NOTE — Progress Notes (Signed)
Summary: cpap issues  Phone Note Call from Patient Call back at Home Phone [phone]   Caller: Patient Call For: sood Summary of Call: Pt states she needs another cpap machine, re: the one she has now is broken. Initial call taken by: Netta Neat,  February 08, 2010 12:44 PM  Follow-up for Phone Call        The patient says cpap is blowing into her eyes and she is not getting the full benefit of wearing it.  She believes the pressure is too low.   She would also like to switch DME from [facility] and is very unhappy with their service.  Pls advise.Francesca Jewett [facility]  February 08, 2010 2:55 PM  Additional Follow-up for Phone Call Additional follow up Details #1::        Will send order to [facility] to arrange for evaluation of CPAP machine.  Please explain to patient that she may need to have this done through [facility] first until she can get switched to a different DME company.  Additional Follow-up by: [name] MD,  February 08, 2010 5:07 PM    Additional Follow-up for Phone Call Additional follow up Details #2::    Spoke with pt and notified of the above recs per VS.   Pt verbalized understanding andf is okay with this plan. Follow-up by: Tilden Dome,  February 08, 2010 5:13 PM

## 2010-02-22 NOTE — Progress Notes (Signed)
Summary: Pharmacy?  Phone Note From Pharmacy   Caller: [facility] Summary of Call: Pharmacy called to verify of pt should be taking Levermir flexpen 60u qam in addition to Novolog flexpen and Novolog mix or is this to replace one or both previous Insulins. please advise Initial call taken by: Crissie Sickles, [place],  February 16, 2009 2:54 PM  Follow-up for Phone Call        yes, levemir will be the only insulin pt will be taking Follow-up by: Donavan Foil MD,  February 16, 2009 2:58 PM  Additional Follow-up for Phone Call Additional follow up Details #1::        Pharmacist Jonathon informed Additional Follow-up by: Crissie Sickles, [place],  February 16, 2009 3:02 PM

## 2010-02-22 NOTE — Assessment & Plan Note (Signed)
Summary: FOLLOW UP RESULTS/CB   Visit Type:  Follow-up Copy to:  Renato Shin Primary Provider/Referring Provider:  Sheliah Hatch  CC:  Sleep study follow-up. The patient c/o increased sob with exertion or any activity for 1 month..  History of Present Illness: 65 yo female with dyspnea with asthma, rhinitis with post-nasal drip, obesity, and 2nd pulmonary hypertension.  She had her sleep study on April 18.  This showed mild to moderate sleep apnea.  She also had significant oxygen desaturation associated with her apneic events.  She has been using advair two times a day.  She has not been using albuterol much.  She still gets some cough and wheeze at night, but feels her breathing is okay during the day.  She has been doing water aerobics for the past 3 months.  Her sinuses have been doing okay.  Current Medications (verified): 1)  Albuterol Sulfate (2.5 Mg/53ml) 0.083% Nebu (Albuterol Sulfate) .... One Vial Nebulized Up To Four Times Per Day As Needed 2)  Fluticasone Propionate 50 Mcg/act Susp (Fluticasone Propionate) .... 2 Sprays in Each Nostril Daily 3)  Caltrate 600+d 600-400 Mg-Unit  Tabs (Calcium Carbonate-Vitamin D) .... Take 1 By Mouth Qd 4)  Simvastatin 80 Mg  Tabs (Simvastatin) .... Take 1 By Mouth Qd 5)  Norvasc 5 Mg  Tabs (Amlodipine Besylate) .... Take 1 By Mouth Qd 6)  Benicar Hct 40-25 Mg  Tabs (Olmesartan Medoxomil-Hctz) .... Take 1 By Mouth Once Daily Qd 7)  Celexa 20 Mg  Tabs (Citalopram Hydrobromide) .... Take 1 By Mouth Qd 8)  Omeprazole 40 Mg  Cpdr (Omeprazole) .Marland Kitchen.. 1 Each Day 30 Minutes Before Meal 9)  Zetia 10 Mg  Tabs (Ezetimibe) .... Take 1 By Mouth Qd 10)  Furosemide 80 Mg  Tabs (Furosemide) .... Take 1 By Mouth Two Times A Day 11)  Evista 60 Mg  Tabs (Raloxifene Hcl) .... Take 1 Q Am 12)  Potassium Chloride Crys Cr 20 Meq  Tbcr (Potassium Chloride Crys Cr) .... Take 2 By Mouth Qd 13)  Glucophage 500 Mg  Tabs (Metformin Hcl) .... Take 2 By Mouth Bid 14)   Lopressor 50 Mg  Tabs (Metoprolol Tartrate) .... Take 1 By Mouth Bid 15)  Lidoderm 5 %  Ptch (Lidocaine) .... Qd 16)  Metoclopramide Hcl 10 Mg  Tabs (Metoclopramide Hcl) .... Take 1 Tablet By Mouth 4 Times Daily. 12 Minutes Before [employer] and At Bedtime. 17)  Lidex 0.05 %  Crea (Fluocinonide) .... Three Times A Day As Needed Itching  Disp 1 Med Tube 18)  Cardizem Cd 240 Mg  Xr24h-Cap (Diltiazem Hcl Coated Beads) .... Qd 19)  Hydralazine Hcl 10 Mg  Tabs (Hydralazine Hcl) .... Tid 20)  Tramadol Hcl 50 Mg Tabs (Tramadol Hcl) .Marland Kitchen.. 1 Q4h As Needed Pain 21)  Aspirin 81 Mg .... Once Daily 22)  Advair Diskus 250-50 Mcg/dose Aepb (Fluticasone-Salmeterol) .... One Puff Two Times A Day 23)  Humulin N 100 Unit/ml Susp (Insulin Isophane Human) .... 60 Units Each Am, and Syringes Once Daily  Allergies (verified): 1)  ! Phenergan  Past History:  Past Surgical History: Last updated: 08/09/2006 Hysterectomy ERD (08/08/2000) Left TKR (1998) EDG (06/25/2006) Cardiac Catherization (07/28/2003) Rest Cardiolite (07/12/2003) EKG (03/01/2005 DEXA (11/2005)  Past Medical History: Allergic rhinitis Asthma      - Spirometry 03/15/09 FEV1 1.35(61%), FVC 1.72(57%), FEV1% 78, +BD OSA      - PSG 05/08/09 AHI 11, RDI 21 Depression Diabetes mellitus, type II GERD Hypertension Hyperlipidemia Osteoarthritis Osteoporosis  Hair Loss Arthritis Left CTS Non-Cardiogenic Morbid Obesity Mild Anemia w/u NEG Mild CAD CVA on MRI Diverticulosis Esophageal Stricture Gastritis, unspecified Gastroparesis Adenomatous Colon Polyps Sickle cell trait  Vital Signs:  Patient profile:   65 year old female Height:      64 inches (162.56 cm) Weight:      180 pounds (81.82 kg) BMI:     31.01 O2 Sat:      92 % on Room air Temp:     98.1 degrees F (36.72 degrees C) oral Pulse rate:   101 / minute BP sitting:   130 / 78  (left arm) Cuff size:   large  Vitals Entered By: Francesca Jewett CMA (Jun 16, 2009 4:10  PM)  O2 Sat at Rest %:  92 O2 Flow:  Room air CC: Sleep study follow-up. The patient c/o increased sob with exertion or any activity for 1 month. Comments Medications reviewed. Daytime phone number verified. Francesca Jewett CMA  Jun 16, 2009 4:11 PM   Physical Exam  General:  healthy appearing and obese.   Nose:  no deformity, discharge, inflammation, or lesions Mouth:  MP 3, no oral lesions Neck:  no JVD.   Lungs:  diminished breath sounds, no wheezing Heart:  Regular rate and rhythm without murmurs or gallops noted. Normal S1,S2.   Extremities:  no clubbing, cyanosis, edema, or deformity noted Cervical Nodes:  no significant adenopathy   Impression & Recommendations:  Problem # 1:  OBSTRUCTIVE SLEEP APNEA (ICD-327.23) I reviewed her sleep study with her.  I explained how sleep apnea can affect her health.  Driving precautions and need for weight reduction were discussed.  Will arrange for auto CPAP.  Will also arrange for overnight oximetry on CPAP.  Explained that is she is not able to tolerate initial set up, then should need to have an in-lab titration study.  Problem # 2:  ASTHMA (ICD-493.90) Continue advair 250/50 one puff bid, and as needed albuterol.  Problem # 3:  ALLERGIC RHINITIS (ICD-477.9) She is to continue on her sinus regimen.  Problem # 4:  PULMONARY HYPERTENSION (ICD-416.8)  She had elevated pulmonary pressures during her recent hospitalization on Echo.  I suspect this is secondary to possible obstructive lung disease and sleep disordered breathing.  Would not pursue specific interventions for her pulmonary hypertension until her other respiratory conditions are better addressed.  Complete Medication List: 1)  Advair Diskus 250-50 Mcg/dose Aepb (Fluticasone-salmeterol) .... One puff two times a day 2)  Albuterol Sulfate (2.5 Mg/66ml) 0.083% Nebu (Albuterol sulfate) .... One vial nebulized up to four times per day as needed 3)  Fluticasone Propionate 50 Mcg/act  Susp (Fluticasone propionate) .... 2 sprays in each nostril daily 4)  Caltrate 600+d 600-400 Mg-unit Tabs (Calcium carbonate-vitamin d) .... Take 1 by mouth qd 5)  Aspirin 81 Mg  .... Once daily 6)  Norvasc 5 Mg Tabs (Amlodipine besylate) .... Take 1 by mouth qd 7)  Benicar Hct 40-25 Mg Tabs (Olmesartan medoxomil-hctz) .... Take 1 by mouth once daily qd 8)  Lopressor 50 Mg Tabs (Metoprolol tartrate) .... Take 1 by mouth bid 9)  Cardizem Cd 240 Mg Xr24h-cap (Diltiazem hcl coated beads) .... Qd 10)  Hydralazine Hcl 10 Mg Tabs (Hydralazine hcl) .... Tid 11)  Furosemide 80 Mg Tabs (Furosemide) .... Take 1 by mouth two times a day 12)  Potassium Chloride Crys Cr 20 Meq Tbcr (Potassium chloride crys cr) .... Take 2 by mouth qd 13)  Simvastatin 80 Mg Tabs (  Simvastatin) .... Take 1 by mouth qd 14)  Zetia 10 Mg Tabs (Ezetimibe) .... Take 1 by mouth qd 15)  Glucophage 500 Mg Tabs (Metformin hcl) .... Take 2 by mouth bid 16)  Humulin N 100 Unit/ml Susp (Insulin isophane human) .... 60 units each am, and syringes once daily 17)  Celexa 20 Mg Tabs (Citalopram hydrobromide) .... Take 1 by mouth qd 18)  Tramadol Hcl 50 Mg Tabs (Tramadol hcl) .Marland Kitchen.. 1 q4h as needed pain 19)  Lidoderm 5 % Ptch (Lidocaine) .... Qd 20)  Omeprazole 40 Mg Cpdr (Omeprazole) .Marland Kitchen.. 1 each day 30 minutes before meal 21)  Metoclopramide Hcl 10 Mg Tabs (Metoclopramide hcl) .... Take 1 tablet by mouth 4 times daily. 30 minutes before breakfast,lunch dinner and at bedtime. 22)  Evista 60 Mg Tabs (Raloxifene hcl) .... Take 1 q am 23)  Lidex 0.05 % Crea (Fluocinonide) .... Three times a day as needed itching  disp 1 med tube  Other Orders: Est. Patient Level III [id number]) DME Referral (DME)  Patient Instructions: 1)  Will set up CPAP machine at home 2)  Will arrange for oxygen test while using CPAP 3)  Continue advair one puff two times a day 4)  Use nebulizer up to four times per day as needed for cough, wheeze, congestion 5)  Follow up  in 6 to 8 weeks

## 2010-02-22 NOTE — Medication Information (Signed)
Summary: [facility]  [facility]   Imported By: Bubba Hales 11/16/2009 07:31:27  _____________________________________________________________________  External Attachment:    Type:   Image     Comment:   External Document

## 2010-02-22 NOTE — Progress Notes (Signed)
Summary: rx  Phone Note Call from Patient Call back at Home Phone [phone]   Caller: Patient Call For: sood Reason for Call: Talk to Nurse Summary of Call: pt said she called pharmacy and they told her Pulmicort was $35.00, then the delivery driver called her and told her it would be $65.00 and she can't afford $65.00. Physician Pharmacy Initial call taken by: Zigmund Gottron,  March 10, 2009 4:10 PM  Follow-up for Phone Call        pt states that the driver that is delivering her meds keeps calling her and telling her her meds are going to be $65 instead of what the pharmacy told her which was $35. I advised the pt to call her pharmacy and speak to them, because I am ot sure why the driver would be calling her and telling her a different price. Advised if the meds were going to be too exspensive then to call us back. [place] Bing CMA  March 10, 2009 4:17 PM

## 2010-02-22 NOTE — Miscellaneous (Signed)
Summary: Orders Update pft charges  Clinical Lists Changes  Orders: Added new Service order of Carbon Monoxide diffusing w/capacity (94720) - Signed Added new Service order of Lung Volumes (94240) - Signed Added new Service order of Spirometry (Pre & Post) (94060) - Signed 

## 2010-02-22 NOTE — Progress Notes (Signed)
  Phone Note Other Incoming   Request: Send information Summary of Call: Request received from [place], [place], [employer] forwarded to [employer].

## 2010-02-22 NOTE — Assessment & Plan Note (Signed)
Summary: Cardiology Nuclear Study  Nuclear Med Background Indications for Stress Test: Evaluation for Ischemia   History: Asthma, Echo, Heart Catheterization, Myocardial Perfusion Study  History Comments: '05 Heart Cath: NL, EF=70% '09 MPS: EF= 61%, NL 1/11 Echo: EF= 55-65%  Symptoms: Chest Pain, Diaphoresis, Dizziness, DOE, Fatigue, Fatigue with Exertion, Nausea, Near Syncope, Palpitations, Rapid HR, SOB    Nuclear Pre-Procedure Cardiac Risk Factors: Family History - CAD, History of Smoking, Hypertension, IDDM Type 2, Lipids, Obesity, TIA Caffeine/Decaff Intake: None NPO After: 7:00 PM Lungs: clear IV 0.9% NS with Angio Cath: 18g     IV Site: (R) AC IV Started by: Eliezer Lofts EMT-P Chest Size (in) 42     Cup Size C     Height (in): 65 Weight (lb): 286 BMI: 47.76  Nuclear Med Study 1 or 2 day study:  2 day     Stress Test Type:  Carlton Adam Reading MD:  Jenkins Rouge, MD     Referring MD:  S.Ellison Resting Radionuclide:  Technetium 21m Tetrofosmin     Resting Radionuclide Dose:  33 mCi  Stress Radionuclide:  Technetium 89m Tetrofosmin     Stress Radionuclide Dose:  33 mCi   Stress Protocol   Lexiscan: 0.4 mg   Stress Test Technologist:  Ileene Hutchinson EMT-P     Nuclear Technologist:  Charlton Amor CNMT  Rest Procedure  Myocardial perfusion imaging was performed at rest 45 minutes following the intravenous administration of Myoview Technetium 35m Tetrofosmin.  Stress Procedure  The patient received IV Lexiscan 0.4 mg over 15-seconds.  Myoview injected at 30-seconds.  There were no significant changes with infusion.  Quantitative spect images were obtained after a 45 minute delay.  QPS Raw Data Images:  Lots of liver uptake Stress Images:  Inferior attenuation Rest Images:  Inferior attenuation Subtraction (SDS):  SDS 2 Transient Ischemic Dilatation:  .95  (Normal <1.22)  Lung/Heart Ratio:  .32  (Normal <0.45)  Quantitative Gated Spect Images QGS EDV:  89 ml QGS  ESV:  28 ml QGS EF:  68 % QGS cine images:  normal  Findings Low risk nuclear study  Evidence for inferior infarct     Overall Impression  Exercise Capacity: Lexiscan BP Response: Normal blood pressure response. Clinical Symptoms: Wheezy ECG Impression: No significant ST segment change suggestive of ischemia. Overall Impression: Possible small inferior wall infarct at mid and apical level.  Specificity decreased due to liver and diagphragmatic artifact  Appended Document: Orders Update  i left message on phone [facility] ref cardiol   Clinical Lists Changes  Orders: Added new Referral order of Cardiology Referral (Cardiology) - Signed

## 2010-02-22 NOTE — Cardiovascular Report (Signed)
Summary: Pre-Cath Orders  Pre-Cath Orders   Imported By: Gemma Payor 07/19/2009 13:15:49  _____________________________________________________________________  External Attachment:    Type:   Image     Comment:   External Document

## 2010-02-22 NOTE — Assessment & Plan Note (Signed)
Summary: CPX /DM  PER DR HARVETTE Joann Gutierrez  #   Vital Signs:  Patient profile:   65 year old female Height:      64 inches (162.56 cm) Weight:      273.50 pounds (124.32 kg) O2 Sat:      97 % on Room air Temp:     97.1 degrees F (36.17 degrees C) oral Pulse rate:   95 / minute BP sitting:   148 / 78  (left arm) Cuff size:   large  Vitals Entered By: Gardenia Phlegm CMA (February 16, 2009 10:25 AM)  O2 Flow:  Room air CC: Physical/ pt states she needs a refill on Lopressor/ pt states she doesn't use Humalog pen, Prevpax, or take Cefuroxime, or Benzonatate/CF Is Patient Diabetic? Yes   Primary Provider:  Renato Shin, MD  CC:  Physical/ pt states she needs a refill on Lopressor/ pt states she doesn't use Humalog pen, Prevpax, or take Cefuroxime, and or Benzonatate/CF.  History of Present Illness: here for regular wellness examination.  she's feeling pretty well in general, and does not drink or smoke.   Current Medications (verified): 1)  Caltrate 600+d 600-400 Mg-Unit  Tabs (Calcium Carbonate-Vitamin D) .... Take 1 By Mouth Qd 2)  Simvastatin 80 Mg  Tabs (Simvastatin) .... Take 1 By Mouth Qd 3)  Norvasc 5 Mg  Tabs (Amlodipine Besylate) .... Take 1 By Mouth Qd 4)  Benicar Hct 40-25 Mg  Tabs (Olmesartan Medoxomil-Hctz) .... Take 1 By Mouth Once Daily Qd 5)  Celexa 20 Mg  Tabs (Citalopram Hydrobromide) .... Take 1 By Mouth Qd 6)  Advair Diskus 100-50 Mcg/dose  Misc (Fluticasone-Salmeterol) .... 2 Puff Qd 7)  Omeprazole 40 Mg  Cpdr (Omeprazole) .Marland Kitchen.. 1 Each Day 30 Minutes Before Meal 8)  Zetia 10 Mg  Tabs (Ezetimibe) .... Take 1 By Mouth Qd 9)  Furosemide 80 Mg  Tabs (Furosemide) .... Take 1 By Mouth Two Times A Day 10)  Evista 60 Mg  Tabs (Raloxifene Hcl) .... Take 1 Q Am 11)  Potassium Chloride Crys Cr 20 Meq  Tbcr (Potassium Chloride Crys Cr) .... Take 2 By Mouth Qd 12)  Glucophage 500 Mg  Tabs (Metformin Hcl) .... Take 2 By Mouth Bid 13)  Lopressor 50 Mg  Tabs (Metoprolol  Tartrate) .... Take 1 By Mouth Bid 14)  Lidoderm 5 %  Ptch (Lidocaine) .... Qd 15)  Metoclopramide Hcl 10 Mg  Tabs (Metoclopramide Hcl) .... Take 1 Tablet By Mouth 4 Times Daily. 79 Minutes Before [employer] and At Bedtime. 16)  Humalog Mix 50/50 Pen 50-50 %  Susp (Insulin Lispro Prot & Lispro) .... 40 Units Qam 17)  Lidex 0.05 %  Crea (Fluocinonide) .... Three Times A Day As Needed Itching  Disp 1 Med Tube 18)  Cardizem Cd 240 Mg  Xr24h-Cap (Diltiazem Hcl Coated Beads) .... Qd 19)  Hydralazine Hcl 10 Mg  Tabs (Hydralazine Hcl) .... Tid 20)  Electric Seat Lift 21)  Tramadol Hcl 50 Mg Tabs (Tramadol Hcl) .Marland Kitchen.. 1 Q4h As Needed Pain 22)  Prevpac   Misc (Amoxicill-Clarithro-Lansopraz) .... As Directed For 10 Days. 23)  Robinul-Forte 2 Mg Tabs (Glycopyrrolate) .Marland Kitchen.. 1 Tablet Bid  For 5 Days Then As Needed For Abdominal Pain 24)  Cefuroxime Axetil 250 Mg Tabs (Cefuroxime Axetil) .Marland Kitchen.. 1 Bid 25)  Benzonatate 100 Mg Caps (Benzonatate) .Marland Kitchen.. 1 Three Times A Day As Needed Cough 26)  Novolog Mix 70/30 Flexpen 70-30 % Susp (Insulin Aspart Prot & Aspart) .Marland KitchenMarland KitchenMarland Kitchen  28 in Am and 28 in Pm 27)  Novolog Flex Pen .... Sliding Scale  Allergies (verified): 1)  ! Phenergan  Family History: Reviewed history from 07/28/2007 and no changes required. Family History of Colon Cancer: brother ? Family History of Liver Cancer: mother Family History of Diabetes: Mother,  2 sisters Family History of Heart Disease: mother, sister Family History of Kidney Disease: mother, 2 sisters  Social History: Reviewed history from 06/08/2008 and no changes required. Widowed, disabled Patient is a former smoker. Quit 30+ yrs ago Daily Caffeine Use coffee and tea Alcohol Use - no Illicit Drug Use - no  Review of Systems       The patient complains of weight gain.  The patient denies fever, vision loss, decreased hearing, syncope, prolonged cough, headaches, hemoptysis, melena, hematochezia, suspicious skin lesions, and  depression.         pt states headache, chest pain sob, nausea, heartburn, abdominal pain, right shoulder pain  Physical Exam  General:  normal appearance.   Head:  head: no deformity eyes: no periorbital swelling, no proptosis external nose and ears are normal mouth: no lesion seen Neck:  Supple without thyroid enlargement or tenderness. No cervical lymphadenopathy Breasts:  sees gyn  Lungs:  Clear to auscultation bilaterally. Normal respiratory effort.  Heart:  Regular rate and rhythm without murmurs or gallops noted. Normal S1,S2.   Abdomen:  abdomen is soft, nontender.  no hepatosplenomegaly.   not distended.  no hernia  Rectal:  sees gyn  Genitalia:  sees gyn  Msk:  muscle bulk and strength are grossly normal.  no obvious joint swelling.  gait is normal and steady  Neurologic:  cn 2-12 grossly intact.   readily moves all 4's.    Skin:  normal texture and temp.  no rash.  not diaphoretic  Cervical Nodes:  No significant adenopathy.  Psych:  Alert and cooperative; normal mood and affect; normal attention span and concentration.   Additional Exam:  SEPARATE EVALUATION FOLLOWS--EACH PROBLEM HERE IS NEW, NOT RESPONDING TO TREATMENT, OR POSES SIGNIFICANT RISK TO THE PATIENT'S HEALTH: HISTORY OF THE PRESENT ILLNESS: pt says she takes humalog 50/50, 28 units two times a day, and "sliding-scale" insulin  PAST MEDICAL HISTORY reviewed and up to date today REVIEW OF SYSTEMS: denies hypoglycemia PHYSICAL EXAMINATION: sensation is intact to touch on the feet no deformity.  no ulcer on the feet.  feet are of normal color and temp.  there is trace bilateral leg edema dorsalis pedis intact bilat.  no carotid bruit LAB/XRAY RESULTS: Hemoglobin A1C       [H]  11.4 % IMPRESSION: dm, needs increased rx large # of sxs limit their interpretation PLAN: see instruction sheet    Impression & Recommendations:  Problem # 1:  ROUTINE GENERAL MEDICAL EXAM@HEALTH  CARE FACL  (ICD-V70.0)  Medications Added to Medication List This Visit: 1)  Novolog Mix 70/30 Flexpen 70-30 % Susp (Insulin aspart prot & aspart) .... 28 in am and 28 in pm 2)  Novolog Flex Pen  .... Sliding scale 3)  Levemir Flexpen 100 Unit/ml Soln (Insulin detemir) .... 60 units qam  Other Orders: TLB-Lipid Panel (80061-LIPID) TLB-BMP (Basic Metabolic Panel-BMET) (99991111) Est. Patient Level III [id number]) Est. Patient 40-64 years [id number])  Preventive Care Screening     gyn is dr Gaetano Net, who does mammography and dexa   Patient Instructions: 1)  change current insulin to levemir 60 units each am. 2)  check your blood sugar 2 times a day.  vary  the time of day when you check, between before the 3 meals, and at bedtime.  also check if you have symptoms of your blood sugar being too high or too low.  please keep a record of the readings and bring it to your next appointment here.  please call us sooner if you are having low blood sugar episodes. 3)  Please schedule a follow-up appointment in 2 weeks. Prescriptions: LOPRESSOR 50 MG  TABS (METOPROLOL TARTRATE) TAKE 1 by mouth BID  #180 x 3   Entered and Authorized by:   Donavan Foil MD   Signed by:   Donavan Foil MD on 02/16/2009   Method used:   Faxed to ...       [facility] (mail-order)       [address] [place], [place]  [address]       Ph: [phone]       Fax: [phone]   RxID:   [id number] LEVEMIR FLEXPEN 100 UNIT/ML SOLN (INSULIN DETEMIR) 60 units qam  #2 boxes x 11   Entered and Authorized by:   Donavan Foil MD   Signed by:   Donavan Foil MD on 02/16/2009   Method used:   Faxed to ...       [facility] (mail-order)       [address] [place], [place]  [address]       Ph: [phone]       Fax: [phone]   RxID:   [id number]    Preventive Care Screening     gyn is dr Gaetano Net, who does mammography and dexa

## 2010-02-22 NOTE — Assessment & Plan Note (Signed)
Summary: one month follow up-lb   Vital Signs:  Patient profile:   65 year old female Height:      65 inches (165.10 cm) Weight:      287.25 pounds (130.57 kg) BMI:     47.97 O2 Sat:      96 % on Room air Temp:     98.4 degrees F (36.89 degrees C) oral Pulse rate:   76 / minute BP sitting:   114 / 70  (left arm) Cuff size:   large  Vitals Entered By: Rebeca Alert MA (August 01, 2009 1:31 PM)  O2 Flow:  Room air CC: 1 mo f/u/aj   Referring Provider:  Renato Shin, Marijo File Primary Provider:  Donavan Foil MD  CC:  1 mo f/u/aj.  History of Present Illness: no cbg record, but states cbg's have been low in the middle of the night, twice since last ov.  it was 42 once, and 56 another night.  pt says "i feel bad all the time."  she has fatigue.    Current Medications (verified): 1)  Advair Diskus 250-50 Mcg/dose Aepb (Fluticasone-Salmeterol) .... One Puff Two Times A Day 2)  Albuterol Sulfate (2.5 Mg/39ml) 0.083% Nebu (Albuterol Sulfate) .... One Vial Nebulized Up To Four Times Per Day As Needed 3)  Fluticasone Propionate 50 Mcg/act Susp (Fluticasone Propionate) .... 2 Sprays in Each Nostril Daily 4)  Caltrate 600+d 600-400 Mg-Unit  Tabs (Calcium Carbonate-Vitamin D) .... Take 1 By Mouth Qd 5)  Aspirin 81 Mg .... Once Daily 6)  Norvasc 5 Mg  Tabs (Amlodipine Besylate) .... Take 1 By Mouth Qd 7)  Benicar Hct 40-25 Mg  Tabs (Olmesartan Medoxomil-Hctz) .... Take 1 By Mouth Once Daily Qd 8)  Lopressor 50 Mg  Tabs (Metoprolol Tartrate) .... Take 1 By Mouth Bid 9)  Cardizem Cd 240 Mg  Xr24h-Cap (Diltiazem Hcl Coated Beads) .... Qd 10)  Hydralazine Hcl 10 Mg  Tabs (Hydralazine Hcl) .... Tid 11)  Furosemide 80 Mg  Tabs (Furosemide) .... Take 1 By Mouth Two Times A Day 12)  Potassium Chloride Crys Cr 20 Meq  Tbcr (Potassium Chloride Crys Cr) .... Take 2 By Mouth Qd 13)  Simvastatin 80 Mg  Tabs (Simvastatin) .... Take 1 By Mouth Qd 14)  Zetia 10 Mg  Tabs (Ezetimibe) .... Take 1 By  Mouth Qd 15)  Glucophage 500 Mg  Tabs (Metformin Hcl) .... Take 2 By Mouth Bid 16)  Humulin N 100 Unit/ml Susp (Insulin Isophane Human) .... 60 Units Each Am, and Syringes Once Daily 17)  Celexa 20 Mg  Tabs (Citalopram Hydrobromide) .... Take 1 By Mouth Qd 18)  Tramadol Hcl 50 Mg Tabs (Tramadol Hcl) .Marland Kitchen.. 1 Q4h As Needed Pain 19)  Lidoderm 5 %  Ptch (Lidocaine) .... Qd 20)  Omeprazole 40 Mg  Cpdr (Omeprazole) .Marland Kitchen.. 1 Each Day 30 Minutes Before Meal 21)  Metoclopramide Hcl 10 Mg  Tabs (Metoclopramide Hcl) .... Take 1 Tablet By Mouth 4 Times Daily. 62 Minutes Before [employer] and At Bedtime. 22)  Evista 60 Mg  Tabs (Raloxifene Hcl) .... Take 1 Q Am 23)  Lidex 0.05 %  Crea (Fluocinonide) .... Three Times A Day As Needed Itching  Disp 1 Med Tube 24)  Bd Insulin Syringe Ultrafine 31g X 5/16" 0.3 Ml Misc (Insulin Syringe-Needle U-100) .... Use As Directed Once Daily  Allergies (verified): 1)  ! Phenergan  Past History:  Past Medical History: Last updated: 07/28/2009 Allergic rhinitis Asthma      -  Spirometry 03/15/09 FEV1 1.35(61%), FVC 1.72(57%), FEV1% 78, +BD OSA      - PSG 05/08/09 AHI 11, RDI 21      - CPAP 9 cm H2O Depression Diabetes mellitus, type II since 1997 GERD Hypertension x years Hyperlipidemia x years Osteoarthritis Osteoporosis Hair Loss Arthritis Left CTS Non-Cardiogenic Morbid Obesity Mild Anemia w/u NEG Mild CAD  very minimal coronary disease with 20% obtuse   marginal stenosis. CVA on MRI Diverticulosis Esophageal Stricture Gastritis, unspecified Gastroparesis Adenomatous Colon Polyps Sickle cell trait  Review of Systems  The patient denies syncope.    Physical Exam  General:  morbidly obese.  no distress. Skin:  insulin injection sites at anterior abdomen are normal  Additional Exam:   Fructosamine         [H]  452 umol/L  (converts to a1c of 9.6)   Impression & Recommendations:  Problem # 1:  DIABETES MELLITUS, TYPE II  (ICD-250.00) i have no choice but to weigh the high fructosamine, the report of nocturnal hypoglycemia, and lack of cbg info, as best i can  Other Orders: T-Fructosamine [id number]) Est. Patient Level III [id number])  Patient Instructions: 1)  blood tests are being ordered for you today.  please call [phone] to hear your test results. 2)  pending the test results, please continue nph, 60 units each am. 3)  check your blood sugar 2 times a day.  vary the time of day when you check, between before the 3 meals, and at bedtime.  also check if you have symptoms of your blood sugar being too high or too low.  please keep a record of the readings and bring it to your next appointment here.  please call us sooner if you are having low blood sugar episodes.  here are some papers to write your blood sugar on.  it is critically important that we be able to review your blood sugar record in order to safely treat your diabetes. 4)  Please schedule a follow-up appointment in 3 months. 5)  please see dr Arnoldo Morale to follow-up your anemia. 6)  (update: i left message on phone-tree:  rx as we discussed)

## 2010-02-22 NOTE — Assessment & Plan Note (Signed)
Summary: 3 mos f/u #/cd   Vital Signs:  Patient profile:   65 year old female Height:      65 inches Weight:      291 pounds BMI:     48.60 O2 Sat:      94 % on Room air Temp:     97.7 degrees F oral Pulse rate:   72 / minute BP sitting:   150 / 80  (left arm) Cuff size:   large  Vitals Entered By: Glenda Chroman (November 02, 2009 2:08 PM)  O2 Flow:  Room air CC: pt here for 3 mth follow up. /cp sma   Referring Provider:  Renato Shin, Marijo File Primary Provider:  Donavan Foil MD  CC:  pt here for 3 mth follow up. /cp sma.  History of Present Illness: pt states she feels well in general, except for fatigue.  no cbg record, but states cbg's are variable.    Current Medications (verified): 1)  Advair Diskus 250-50 Mcg/dose Aepb (Fluticasone-Salmeterol) .... One Puff Two Times A Day 2)  Albuterol Sulfate (2.5 Mg/67ml) 0.083% Nebu (Albuterol Sulfate) .... One Vial Nebulized Up To Four Times Per Day As Needed 3)  Fluticasone Propionate 50 Mcg/act Susp (Fluticasone Propionate) .... 2 Sprays in Each Nostril Daily 4)  Caltrate 600+d 600-400 Mg-Unit  Tabs (Calcium Carbonate-Vitamin D) .... Take 1 By Mouth Qd 5)  Aspirin 81 Mg .... Once Daily 6)  Norvasc 5 Mg  Tabs (Amlodipine Besylate) .... Take 1 By Mouth Qd 7)  Benicar Hct 40-25 Mg  Tabs (Olmesartan Medoxomil-Hctz) .... Take 1 By Mouth Once Daily Qd 8)  Lopressor 50 Mg  Tabs (Metoprolol Tartrate) .... Take 1 By Mouth Bid 9)  Cardizem Cd 240 Mg  Xr24h-Cap (Diltiazem Hcl Coated Beads) .... Qd 10)  Hydralazine Hcl 10 Mg  Tabs (Hydralazine Hcl) .... Tid 11)  Furosemide 80 Mg  Tabs (Furosemide) .... Take 1 By Mouth Two Times A Day 12)  Potassium Chloride Crys Cr 20 Meq  Tbcr (Potassium Chloride Crys Cr) .... Take 2 By Mouth Qd 13)  Simvastatin 80 Mg  Tabs (Simvastatin) .... Take 1 By Mouth Qd 14)  Zetia 10 Mg  Tabs (Ezetimibe) .... Take 1 By Mouth Qd 15)  Glucophage 500 Mg  Tabs (Metformin Hcl) .... Take 2 By Mouth Bid 16)   Humulin N 100 Unit/ml Susp (Insulin Isophane Human) .... 60 Units Each Am, and Syringes Once Daily 17)  Celexa 20 Mg  Tabs (Citalopram Hydrobromide) .... Take 1 By Mouth Qd 18)  Tramadol Hcl 50 Mg Tabs (Tramadol Hcl) .Marland Kitchen.. 1 Q4h As Needed Pain 19)  Lidoderm 5 %  Ptch (Lidocaine) .... Qd 20)  Omeprazole 40 Mg  Cpdr (Omeprazole) .Marland Kitchen.. 1 Each Day 30 Minutes Before Meal 21)  Metoclopramide Hcl 10 Mg  Tabs (Metoclopramide Hcl) .... Take 1 Tablet By Mouth 4 Times Daily. 5 Minutes Before [employer] and At Bedtime. 22)  Evista 60 Mg  Tabs (Raloxifene Hcl) .... Take 1 Q Am 23)  Lidex 0.05 %  Crea (Fluocinonide) .... Three Times A Day As Needed Itching  Disp 1 Med Tube 24)  Bd Insulin Syringe Ultrafine 31g X 5/16" 0.3 Ml Misc (Insulin Syringe-Needle U-100) .... Use As Directed Once Daily  Allergies (verified): 1)  ! Phenergan  Past History:  Past Medical History: Last updated: 07/28/2009 Allergic rhinitis Asthma      - Spirometry 03/15/09 FEV1 1.35(61%), FVC 1.72(57%), FEV1% 78, +BD OSA      -  PSG 05/08/09 AHI 11, RDI 21      - CPAP 9 cm H2O Depression Diabetes mellitus, type II since 1997 GERD Hypertension x years Hyperlipidemia x years Osteoarthritis Osteoporosis Hair Loss Arthritis Left CTS Non-Cardiogenic Morbid Obesity Mild Anemia w/u NEG Mild CAD  very minimal coronary disease with 20% obtuse   marginal stenosis. CVA on MRI Diverticulosis Esophageal Stricture Gastritis, unspecified Gastroparesis Adenomatous Colon Polyps Sickle cell trait  Review of Systems  The patient denies syncope.    Physical Exam  General:  morbidly obese.  no distress Pulses:  dorsalis pedis intact bilat.  Extremities:  no deformity.  no ulcer on the feet.  feet are of normal color and temp.  no edema  Neurologic:  sensation is intact to touch on the feet  Additional Exam:  Hemoglobin A1C       [H]  8.3 %      Impression & Recommendations:  Problem # 1:  DIABETES MELLITUS,  TYPE II (ICD-250.00) she needs increased rx, but i can't safely do so without a cbg record.    Other Orders: TLB-A1C / Hgb A1C (Glycohemoglobin) (83036-A1C) Flu Vaccine 57yrs + MEDICARE PATIENTS [id number]) Administration Flu vaccine - MCR (G0008) Est. Patient Level III [id number])  Patient Instructions: 1)  blood tests are being ordered for you today.  please call [phone] to hear your test results. 2)  pending the test results, please continue nph, 60 units each am. 3)  check your blood sugar 2 times a day.  vary the time of day when you check, between before the 3 meals, and at bedtime.  also check if you have symptoms of your blood sugar being too high or too low.  please keep a record of the readings and bring it to your next appointment here.  please call us sooner if you are having low blood sugar episodes.  here are some papers to write your blood sugar on.  it is critically important that we be able to review your blood sugar record in order to safely treat your diabetes. 4)  Please schedule a follow-up appointment in 2 weeks. 5)  (update: i left message on phone-tree:  rx as we discussed)    Flu Vaccine Consent Questions     Do you have a history of severe allergic reactions to this vaccine? no    Any prior history of allergic reactions to egg and/or gelatin? no    Do you have a sensitivity to the preservative Thimersol? no    Do you have a past history of Guillan-Barre Syndrome? no    Do you currently have an acute febrile illness? no    Have you ever had a severe reaction to latex? no    Vaccine information given and explained to patient? yes    Are you currently pregnant? no    Lot Number:[id number]   Exp Date:07/21/2010   Site Given  Left Deltoid [id number]

## 2010-02-22 NOTE — Progress Notes (Signed)
Summary: CPAP  Phone Note Call from Patient Call back at Home Phone [phone]   Caller: Patient Call For: dr Halford Chessman Summary of Call: pateint phoned she stated that her nurse told her that [facility] was going to pick her CPAP, O2, and nebulizer. She has an appointment scheduled for 02/28/10 but would like to get orders for her CPAP and supplies for another company. Patient wants to know if she has to be seen to get new orders. She can be reached [phone] Initial call taken by: Ozella Rocks,  February 01, 2010 11:41 AM  Follow-up for Phone Call        Spoke with pt.  She states that she is unhappy with [facility] and wishes to switch to [facility] for o2 and cpap.  I sent order to [facility].  Follow-up by: Tilden Dome,  February 01, 2010 2:28 PM

## 2010-02-22 NOTE — Medication Information (Signed)
Summary: Diabetic Supplies / [facility]  Diabetic Supplies / [facility]   Imported By: Rise Patience 04/18/2009 13:20:57  _____________________________________________________________________  External Attachment:    Type:   Image     Comment:   External Document

## 2010-02-22 NOTE — Letter (Signed)
Summary: [facility]   Imported By: Rise Patience 09/15/2009 12:11:44  _____________________________________________________________________  External Attachment:    Type:   Image     Comment:   External Document

## 2010-02-22 NOTE — Progress Notes (Signed)
Summary: PA/RX  Phone Note From Pharmacy   Summary of Call: Called to check on status of patient prior authorization and was notified that patient does not have that insurance. I spoke with patient and she has a letter that the prescription is approved and she also has the medication. Per patient she needs prescription for [facility]. Please advise. Initial call taken by: Ernestene Mention,  Jun 20, 2009 11:04 AM  Follow-up for Phone Call        ok for needle rx to [facility] Follow-up by: Biagio Borg MD,  June 22, 2009 6:25 PM  Additional Follow-up for Phone Call Additional follow up Details #1::        Left message for pt to call back with what type of needles/brand she is wanting. Additional Follow-up by: Gardenia Phlegm RMA,  June 23, 2009 8:02 AM    Additional Follow-up for Phone Call Additional follow up Details #2::    Spoke with pt and she informed me of the type of Insulin needles she uses Follow-up by: Crissie Sickles, CMA,  June 27, 2009 11:58 AM  New/Updated Medications: BD INSULIN SYRINGE ULTRAFINE 31G X 5/16" 0.3 ML MISC (INSULIN SYRINGE-NEEDLE U-100) use as directed once daily Prescriptions: BD INSULIN SYRINGE ULTRAFINE 31G X 5/16" 0.3 ML MISC (INSULIN SYRINGE-NEEDLE U-100) use as directed once daily  #90 x 3   Entered by:   Crissie Sickles, CMA   Authorized by:   Donavan Foil MD   Signed by:   Crissie Sickles, CMA on 06/27/2009   Method used:   Faxed to ...       [facility] (retail)       [place]       [place], [place]  [address]       Ph: [phone]       Fax: [phone]   RxID:   [id number]

## 2010-02-22 NOTE — Progress Notes (Signed)
Summary: [facility]  Phone Note Other Incoming   CallerJordan Hawks [facility] [phone] Summary of Call: HHRN called to inform MD that pt is being D/C'd today, she is much improved. HHRN wanted to make sure this was okay with PCP. * HH order was made by Dr. Arnoldo Morale. Initial call taken by: Crissie Sickles, [place],  March 13, 2009 11:48 AM  Follow-up for Phone Call        noted, thank you Follow-up by: Donavan Foil MD,  March 13, 2009 12:38 PM  Additional Follow-up for Phone Call Additional follow up Details #1::        [facility] informed Additional Follow-up by: Crissie Sickles, [place],  March 13, 2009 1:13 PM

## 2010-02-22 NOTE — Progress Notes (Signed)
Summary: new sleep study needed<<<not at this time  Phone Note Other Incoming Call back at [phone]   Caller: [facility] Summary of Call: Octavia Bruckner is wanting to know if patient will need a new sleep study before cpap order. Initial call taken by: Mateo Flow,  February 01, 2010 2:35 PM  Follow-up for Phone Call        [place]  February 01, 2010 3:34 PM  [email or web address]. [place] Bing CMA  February 02, 2010 10:30 AM  Last sleep study was done 04/2009. No new study is needed. LM to inform Tim of this and to call if he has any other questions. Follow-up by: Francesca Jewett CMA,  February 05, 2010 12:07 PM

## 2010-02-22 NOTE — Medication Information (Signed)
Summary: [facility]  [facility]   Imported By: Bubba Hales 11/14/2009 08:03:37  _____________________________________________________________________  External Attachment:    Type:   Image     Comment:   External Document

## 2010-02-22 NOTE — Miscellaneous (Signed)
Summary: Sleep study 05/08/09   Clinical Lists Changes AHI 11, RDI 21, Oxygen nadir 68%.  Significant REM effect.  Will have my nurse call to schedule next available ROV to review.  Appended Document: Sleep study 05/08/09 [id number].  Appended Document: Sleep study 05/08/09 LMOMTCB.  Appended Document: Sleep study 05/08/09 Patient to f/u with VS on 06/16/2009.

## 2010-02-22 NOTE — Assessment & Plan Note (Signed)
Summary: np6/chest pain/unspecified/arrival @ 12:15/ gd    Visit Type:  Follow-up Primary Provider:  Donavan Foil MD  CC:  Abnormal Stress Test/ Chest Pain.  History of Present Illness: The patient presents for followup of an abnormal stress test. She has a history of nonobstructive coronary disease with her last catheterization in 2005. She was last admitted in January with chest discomfort and ruled out. She continues to have chest discomfort which is increasing in frequency and severity. She describes a left-sided chest pain. It can be sharp. It does radiate to her neck and to her back. It comes on at rest. It persists for several minutes. It goes away spontaneously. It can be 10 out of 10 in intensity with nausea, shortness of breath and diaphoresis. She also has increasing shortness of breath with minimal activity such as walking a short distance on level ground. She has slept with her head elevated for some time but doesn't describe classic PND. The patient was sent for pharmacologic stress perfusion study which demonstrated a preserved ejection fraction.  There was a small inferior infarct.  Current Medications (verified): 1)  Advair Diskus 250-50 Mcg/dose Aepb (Fluticasone-Salmeterol) .... One Puff Two Times A Day 2)  Albuterol Sulfate (2.5 Mg/78ml) 0.083% Nebu (Albuterol Sulfate) .... One Vial Nebulized Up To Four Times Per Day As Needed 3)  Fluticasone Propionate 50 Mcg/act Susp (Fluticasone Propionate) .... 2 Sprays in Each Nostril Daily 4)  Caltrate 600+d 600-400 Mg-Unit  Tabs (Calcium Carbonate-Vitamin D) .... Take 1 By Mouth Qd 5)  Aspirin 81 Mg .... Once Daily 6)  Norvasc 5 Mg  Tabs (Amlodipine Besylate) .... Take 1 By Mouth Qd 7)  Benicar Hct 40-25 Mg  Tabs (Olmesartan Medoxomil-Hctz) .... Take 1 By Mouth Once Daily Qd 8)  Lopressor 50 Mg  Tabs (Metoprolol Tartrate) .... Take 1 By Mouth Bid 9)  Cardizem Cd 240 Mg  Xr24h-Cap (Diltiazem Hcl Coated Beads) .... Qd 10)  Hydralazine  Hcl 10 Mg  Tabs (Hydralazine Hcl) .... Tid 11)  Furosemide 80 Mg  Tabs (Furosemide) .... Take 1 By Mouth Two Times A Day 12)  Potassium Chloride Crys Cr 20 Meq  Tbcr (Potassium Chloride Crys Cr) .... Take 2 By Mouth Qd 13)  Simvastatin 80 Mg  Tabs (Simvastatin) .... Take 1 By Mouth Qd 14)  Zetia 10 Mg  Tabs (Ezetimibe) .... Take 1 By Mouth Qd 15)  Glucophage 500 Mg  Tabs (Metformin Hcl) .... Take 2 By Mouth Bid 16)  Humulin N 100 Unit/ml Susp (Insulin Isophane Human) .... 60 Units Each Am, and Syringes Once Daily 17)  Celexa 20 Mg  Tabs (Citalopram Hydrobromide) .... Take 1 By Mouth Qd 18)  Tramadol Hcl 50 Mg Tabs (Tramadol Hcl) .Marland Kitchen.. 1 Q4h As Needed Pain 19)  Lidoderm 5 %  Ptch (Lidocaine) .... Qd 20)  Omeprazole 40 Mg  Cpdr (Omeprazole) .Marland Kitchen.. 1 Each Day 30 Minutes Before Meal 21)  Metoclopramide Hcl 10 Mg  Tabs (Metoclopramide Hcl) .... Take 1 Tablet By Mouth 4 Times Daily. 27 Minutes Before [employer] and At Bedtime. 22)  Evista 60 Mg  Tabs (Raloxifene Hcl) .... Take 1 Q Am 23)  Lidex 0.05 %  Crea (Fluocinonide) .... Three Times A Day As Needed Itching  Disp 1 Med Tube 24)  Bd Insulin Syringe Ultrafine 31g X 5/16" 0.3 Ml Misc (Insulin Syringe-Needle U-100) .... Use As Directed Once Daily  Allergies: 1)  ! Phenergan  Past History:  Past Medical History: Allergic rhinitis Asthma      -  Spirometry 03/15/09 FEV1 1.35(61%), FVC 1.72(57%), FEV1% 78, +BD OSA      - PSG 05/08/09 AHI 11, RDI 21 Depression Diabetes mellitus, type II since 1997 GERD Hypertension x years Hyperlipidemia x years Osteoarthritis Osteoporosis Hair Loss Arthritis Left CTS Non-Cardiogenic Morbid Obesity Mild Anemia w/u NEG Mild CAD  very minimal coronary disease with 20% obtuse   marginal stenosis. CVA on MRI Diverticulosis Esophageal Stricture Gastritis, unspecified Gastroparesis Adenomatous Colon Polyps Sickle cell trait  Past Surgical History: Hysterectomy ERD (08/08/2000) Left TKR  (1998) EDG (06/25/2006) DEXA (11/2005) Lumpectomies (benign)  Family History: Family History of Colon Cancer: brother ? Family History of Liver Cancer: mother Family History of Diabetes: Mother,  2 sisters Family History of Heart Disease: mother (age 76s), sisters (age 72s), father (died of MI when patient was 69) Family History of Kidney Disease: mother, 2 sisters allergies: sister father: sickle cell   Social History: Widowed. 1 Child Disabled. prev worked as a Electrical engineer.  Patient is a former smoker.  started at age 58.  2 ppd.  quit approx 1981. Daily Caffeine Use coffee and tea Alcohol Use - no Illicit Drug Use - no  Review of Systems       As stated in the HPI and negative for all other systems.   Vital Signs:  Patient profile:   65 year old female Height:      65 inches Weight:      284 pounds BMI:     47.43 Pulse rate:   78 / minute Pulse rhythm:   irregular BP sitting:   119 / 74  (left arm) Cuff size:   large  Vitals Entered By: Julaine Hua, CMA (July 17, 2009 12:27 PM)  Physical Exam  General:  Well developed, well nourished, in no acute distress. Head:  normocephalic and atraumatic Eyes:  PERRLA/EOM intact; conjunctiva and lids normal. Mouth:  Edentulous. Oral mucosa normal. Neck:  Neck supple, no JVD. No masses, thyromegaly or abnormal cervical nodes. Chest Wall:  no deformities or breast masses noted Lungs:  Clear bilaterally to auscultation and percussion. Abdomen:  Bowel sounds positive; abdomen soft and non-tender without masses, organomegaly, or hernias noted. No hepatosplenomegaly, obese Msk:  Back normal, normal gait. Muscle strength and tone normal. Extremities:  No clubbing or cyanosis. Neurologic:  Alert and oriented x 3. Skin:  Intact without lesions or rashes. Cervical Nodes:  no significant adenopathy Inguinal Nodes:  no significant adenopathy Psych:  Normal affect.   Detailed Cardiovascular Exam  Vascular    Pedal Pulses:  dorsalis pedis intact bilat.    EKG  Procedure date:  07/17/2009  Findings:      Sinus rhythm, rate 78, axis within normal limits, intervals within normal limits, poor anterior R-wave progression  Impression & Recommendations:  Problem # 1:  CHEST PAIN UNSPECIFIED (ICD-786.50) The patient has chest pain with an abnormal stress perfusion study. Cardiac catheterization is indicated as described below.  Orders: TLB-BMP (Basic Metabolic Panel-BMET) (99991111) TLB-BNP (B-Natriuretic Peptide) (83880-BNPR) TLB-CBC Platelet - w/Differential (85025-CBCD) TLB-PT (Protime) (85610-PTP) Cardiac Catheterization (Cardiac Cath)  Problem # 2:  RENAL INSUFFICIENCY (ICD-588.9) I will check a basic metabolic profile today. I will limit contrast. Her renal insufficiency has been very mild.  Problem # 3:  DYSPNEA (ICD-786.05) I am most concerned about the patient's dyspnea. This is progressive and quite limiting. An echocardiogram in January demonstrated a normal ejection fraction and no significant valvular abnormalities. She will need right heart pressures at the time of her catheterization.  Orders: TLB-BMP (Basic Metabolic Panel-BMET) (99991111) TLB-BNP (B-Natriuretic Peptide) (83880-BNPR) TLB-CBC Platelet - w/Differential (85025-CBCD) TLB-PT (Protime) (85610-PTP) Cardiac Catheterization (Cardiac Cath)  Problem # 4:  HYPERCHOLESTEROLEMIA (ICD-272.0) I will switch the patient from simvastatin 80 and she is also on Norvasc.  Patient Instructions: 1)  Your physician recommends that you have lab today--BMP/BNP/CBC/INR  786.50  786.05 2)  Your physician has requested that you have a cardiac catheterization.  Cardiac catheterization is used to diagnose and/or treat various heart conditions. Doctors may recommend this procedure for a number of different reasons. The most common reason is to evaluate chest pain. Chest pain can be a symptom of coronary artery disease (CAD), and cardiac  catheterization can show whether plaque is narrowing or blocking your heart's arteries. This procedure is also used to evaluate the valves, as well as measure the blood flow and oxygen levels in different parts of your heart.  For further information please visit [email or web address].  Please follow instruction sheet, as given. 3)  [facility] lab Thursday June 30,2011 9:30am Dr Percival Spanish

## 2010-02-22 NOTE — Progress Notes (Signed)
Summary: advair/ rx request  Phone Note Call from Patient   Caller: Patient Call For: sood Summary of Call: pt was seen 3/29. requests that her rx for advair be called in to [facility]. pt [id number] Initial call taken by: Cooper Render, CNA,  April 24, 2009 2:18 PM  Follow-up for Phone Call        called and spoke with pt.  informed pt she saw VS on 04-18-2009 and was given a written rx per our emr records.  pt states VS had wanted to give her a written rx but pt declined and had asked the nurse to fax rx to [facility] which they still haven't received rx.  informed pt i would refax to pharamcy.  Jinny Blossom Reynolds LPN  April  4, [id number] PM     Prescriptions: ADVAIR DISKUS 250-50 MCG/DOSE AEPB (FLUTICASONE-SALMETEROL) one puff two times a day  #1 x 3   Entered by:   Matthew Folks LPN   Authorized by:   [name] MD   Signed by:   Matthew Folks LPN on [id number]   Method used:   Faxed to ...       [facility] (retail)       [address]       [place],   [address]       Ph: [phone]       Fax: [phone]   RxID:   [id number]

## 2010-02-22 NOTE — Assessment & Plan Note (Signed)
Summary: 4-6 months/apc   Visit Type:  Follow-up Copy to:  Renato Shin, [facility] Ronne Savoia/Referring Emrah Ariola:  Jana Hakim, MD  CC:  OSA.Marland KitchenMarland KitchenAsthma...doing well on cpap...wears cpap during the day at times to help with SOB...c/o SOB with exertion and at rest...cough with green mucus...headache and sore throat.  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  She was hospitalized on Oct 20 for rt lower lobe pneumonia.  She was treated with rocephin and zithromax initiallly.  She was then changed to avelox, and sent home on levaquin.  She did not go home on prednisone.  She has finished her antibiotics.  She feels a little better compared to when she was in the hospital.  She still has a cough with green sputum.  She denies hemoptysis, chest pain, of fever.  She has been getting some wheeze still.  She has been blowing her nose.  She gets occasional nose bleeds.  She uses her albuterol two times a day, and this helps.  She feels like she could use it more, but was not sure if she was allowed to.  She has not had a chest xray since getting out of the hospital.  She has been using her CPAP.  She is not having any problem with the mask.  She has been using her CPAP some during the day to help her breathing also.   CXR  Procedure date:  11/10/2009  Findings:      CHEST - 2 VIEW    Comparison: 01/30/2009    Findings: Increased opacity seen in the right lower lobe.  The   lungs are otherwise clear.  There are no pleural effusions.  The   heart is normal in size.  There is mild ectasia of the descending   aorta.  The bones are unchanged in appearance.    IMPRESSION:   Right lower lobe pneumonia.  Recommend follow-up chest x-ray in 6-8   weeks to ensure radiographic resolution.   CXR  Procedure date:  11/10/2009  Findings:      CHEST - 2 VIEW   Comparison:  October 21, 2011and January 30, 2009   Findings: There has been some interval improvement in the previously seen right lower lobe pneumonia; however, right middle lobe opacity persists and is best seen on the lateral view.  The left lung is clear.  The cardiac silhouette, mediastinum, pulmonary vasculature are within normal limits.   IMPRESSION: Even though there has been improvement in the right lower lobe infiltrate, dense right middle lobe opacity persists.  Though some of this may be atelectasis because there is subsequent elevation of the right hemi diaphragm.   Current Medications (verified): 1)  Advair Diskus 250-50 Mcg/dose Aepb (Fluticasone-Salmeterol) .... One Puff Two Times A Day 2)  Albuterol Sulfate (2.5 Mg/17ml) 0.083% Nebu (Albuterol Sulfate) .... One Vial Nebulized Up To Four Times Per Day As Needed 3)  Fluticasone Propionate 50 Mcg/act Susp (Fluticasone Propionate) .... 2 Sprays in Each Nostril Daily 4)  Caltrate 600+d 600-400 Mg-Unit  Tabs (Calcium Carbonate-Vitamin D) .... Take 1 By Mouth Once Daily 5)  Aspirin 81 Mg Tabs (Aspirin) .... Once Daily 6)  Cardizem Cd 240 Mg  Xr24h-Cap (Diltiazem Hcl Coated Beads) .... Once Daily 7)  Hydralazine Hcl 10 Mg  Tabs (Hydralazine Hcl) .... Three Times A Day 8)  Furosemide 80 Mg  Tabs (Furosemide) .... Take 1 By Mouth  Two Times A Day 9)  Potassium Chloride Crys Cr 20 Meq  Tbcr (Potassium Chloride Crys Cr) .... 2 By Mouth Two Times A Day 10)  Zetia 10 Mg  Tabs (Ezetimibe) .... Take 1 By Mouth Once Daily 11)  Humulin N 100 Unit/ml Susp (Insulin Isophane Human) .... 55 Units Each Am, and 5 Units in The Evening, and Syringes Two Times A Day 12)  Celexa 20 Mg  Tabs (Citalopram Hydrobromide) .... Take 1 By Mouth Once Daily 13)  Tramadol Hcl 50 Mg Tabs (Tramadol Hcl) .Marland Kitchen.. 1 Q4h As Needed Pain 14)  Lidoderm 5 %  Ptch (Lidocaine) .... Once Daily 15)  Omeprazole 40 Mg  Cpdr (Omeprazole) .Marland Kitchen.. 1 Each Day 30 Minutes Before Meal 16)   Metoclopramide Hcl 10 Mg  Tabs (Metoclopramide Hcl) .... Take 1 Tablet By Mouth 4 Times Daily. 61 Minutes Before [employer] and At Bedtime. 17)  Evista 60 Mg  Tabs (Raloxifene Hcl) .... Take 1 Once Daily 18)  Lidex 0.05 %  Crea (Fluocinonide) .... Three Times A Day As Needed Itching  Disp 1 Med Tube 19)  Bd Insulin Syringe Ultrafine 31g X 5/16" 0.3 Ml Misc (Insulin Syringe-Needle U-100) .... Use As Directed Once Daily 20)  Neurontin 300 Mg Caps (Gabapentin) .Marland Kitchen.. 1 By Mouth Two Times A Day 21)  Metoprolol Tartrate 50 Mg Tabs (Metoprolol Tartrate) .Marland Kitchen.. 1 By Mouth Two Times A Day 22)  Multivitamins   Tabs (Multiple Vitamin) .Marland Kitchen.. 1 By Mouth Daily 23)  Lipitor 40 Mg Tabs (Atorvastatin Calcium) .... One Daily  Allergies (verified): 1)  ! Phenergan  Past History:  Past Medical History: Reviewed history from 07/28/2009 and no changes required. Allergic rhinitis Asthma      - Spirometry 03/15/09 FEV1 1.35(61%), FVC 1.72(57%), FEV1% 78, +BD OSA      - PSG 05/08/09 AHI 11, RDI 21      - CPAP 9 cm H2O Depression Diabetes mellitus, type II since 1997 GERD Hypertension x years Hyperlipidemia x years Osteoarthritis Osteoporosis Hair Loss Arthritis Left CTS Non-Cardiogenic Morbid Obesity Mild Anemia w/u NEG Mild CAD  very minimal coronary disease with 20% obtuse   marginal stenosis. CVA on MRI Diverticulosis Esophageal Stricture Gastritis, unspecified Gastroparesis Adenomatous Colon Polyps Sickle cell trait  Past Surgical History: Reviewed history from 07/17/2009 and no changes required. Hysterectomy ERD (08/08/2000) Left TKR (1998) EDG (06/25/2006) DEXA (11/2005) Lumpectomies (benign)  Vital Signs:  Patient profile:   65 year old female Height:      65 inches (165.10 cm) Weight:      290 pounds (131.82 kg) BMI:     48.43 O2 Sat:      93 % on Room air Temp:     98.3 degrees F (36.83 degrees C) oral Pulse rate:   84 / minute BP sitting:   130 / 80  (left  arm) Cuff size:   large  Vitals Entered By: Francesca Jewett CMA (December 05, 2009 2:40 PM)  O2 Sat at Rest %:  93 O2 Flow:  Room air CC: OSA.Marland KitchenMarland KitchenAsthma...doing well on cpap...wears cpap during the day at times to help with SOB...c/o SOB with exertion and at rest...cough with green mucus...headache and sore throat Comments Medications reviewed with patient Francesca Jewett CMA  December 05, 2009 2:48 PM   Physical Exam  General:  obese, dyspneic Nose:  clear nasal discharge, no tenderness Mouth:  MP 3, no oral lesions Neck:  no JVD.   Lungs:  diminished breath sounds, prolonged exhalation, b/l expiratory wheeze,  faint rales Rt > Lt base, no dullness.  wheeze improved after nebulizer treatment. Heart:  Regular rate and rhythm without murmurs or gallops noted.  Abdomen:  bowel sounds positive; abdomen soft and non-tender without masses, or organomegaly Extremities:  no clubbing, cyanosis, edema, or deformity noted Neurologic:  normal CN II-XII and strength normal.   Cervical Nodes:  no significant adenopathy Psych:  Alert and cooperative; normal mood and affect; normal attention span and concentration.     Impression & Recommendations:  Problem # 1:  PNEUMONIA, ORGANISM UNSPECIFIED (ICD-486) She has improved, but persistent right lower lung infiltrate.  Will give additional course of antibiotics with zithromax.  She will need follow up chest xray in 6 to 8 weeks.  If her symptoms persist, or if she has persistent infiltrate, then she will need CT chest.  Problem # 2:  ASTHMA, WITH ACUTE EXACERBATION (ICD-493.92) Will give her a course of prednisone.  She is to continue advair, and as needed albuterol.  Advised that she could use albuterol on a more regular basis.  Problem # 3:  ALLERGIC RHINITIS (ICD-477.9) She is to continue her sinus regimen.  Problem # 4:  OBSTRUCTIVE SLEEP APNEA (ICD-327.23)  Encouraged her to maintain her compliance with CPAP.  Problem # 5:  DYSPNEA  (ICD-786.05)  Related to asthma, obesity, deconditioning.  Problem # 6:  PULMONARY HYPERTENSION (ICD-416.8)  Continue to optimize secondary causes.  Medications Added to Medication List This Visit: 1)  Aspirin 81 Mg Tabs (Aspirin) .... Once daily 2)  Zithromax Z-pak 250 Mg Tabs (Azithromycin) .... Use as directed 3)  Prednisone 10 Mg Tabs (Prednisone) .... 3 pills for 2 days, 2 pills for 2 days, 1 pill for 2 days, 1/2 pill for 2 days  Complete Medication List: 1)  Advair Diskus 250-50 Mcg/dose Aepb (Fluticasone-salmeterol) .... One puff two times a day 2)  Albuterol Sulfate (2.5 Mg/62ml) 0.083% Nebu (Albuterol sulfate) .... One vial nebulized up to four times per day as needed 3)  Fluticasone Propionate 50 Mcg/act Susp (Fluticasone propionate) .... 2 sprays in each nostril daily 4)  Caltrate 600+d 600-400 Mg-unit Tabs (Calcium carbonate-vitamin d) .... Take 1 by mouth once daily 5)  Aspirin 81 Mg Tabs (Aspirin) .... Once daily 6)  Cardizem Cd 240 Mg Xr24h-cap (Diltiazem hcl coated beads) .... Once daily 7)  Hydralazine Hcl 10 Mg Tabs (Hydralazine hcl) .... Three times a day 8)  Furosemide 80 Mg Tabs (Furosemide) .... Take 1 by mouth two times a day 9)  Potassium Chloride Crys Cr 20 Meq Tbcr (Potassium chloride crys cr) .... 2 by mouth two times a day 10)  Zetia 10 Mg Tabs (Ezetimibe) .... Take 1 by mouth once daily 11)  Humulin N 100 Unit/ml Susp (Insulin isophane human) .... 55 units each am, and 5 units in the evening, and syringes two times a day 12)  Celexa 20 Mg Tabs (Citalopram hydrobromide) .... Take 1 by mouth once daily 13)  Tramadol Hcl 50 Mg Tabs (Tramadol hcl) .Marland Kitchen.. 1 q4h as needed pain 14)  Lidoderm 5 % Ptch (Lidocaine) .... Once daily 15)  Omeprazole 40 Mg Cpdr (Omeprazole) .Marland Kitchen.. 1 each day 30 minutes before meal 16)  Metoclopramide Hcl 10 Mg Tabs (Metoclopramide hcl) .... Take 1 tablet by mouth 4 times daily. 30 minutes before breakfast,lunch dinner and at bedtime. 17)   Evista 60 Mg Tabs (Raloxifene hcl) .... Take 1 once daily 18)  Lidex 0.05 % Crea (Fluocinonide) .... Three times a day as needed itching  disp 1 med tube 19)  Bd Insulin Syringe Ultrafine 31g X 5/16" 0.3 Ml Misc (Insulin syringe-needle u-100) .... Use as directed once daily 20)  Neurontin 300 Mg Caps (Gabapentin) .Marland Kitchen.. 1 by mouth two times a day 21)  Metoprolol Tartrate 50 Mg Tabs (Metoprolol tartrate) .Marland Kitchen.. 1 by mouth two times a day 22)  Multivitamins Tabs (Multiple vitamin) .Marland Kitchen.. 1 by mouth daily 23)  Lipitor 40 Mg Tabs (Atorvastatin calcium) .... One daily 24)  Zithromax Z-pak 250 Mg Tabs (Azithromycin) .... Use as directed 25)  Prednisone 10 Mg Tabs (Prednisone) .... 3 pills for 2 days, 2 pills for 2 days, 1 pill for 2 days, 1/2 pill for 2 days  Other Orders: Nebulizer Tx [id number]) Est. Patient Level IV (99214) T-2 View CXR (71020TC)  Patient Instructions: 1)  Zithromax (antibiotic) 2 pills on first day, then 1 pill for 4 days 2)  Prednisone 10 mg pills: 3 pills for 2 days, 2 pills for 2 days, 1 pill for 2 days, 1/2 pill for 2 days, then stop prednisone 3)  Follow up in 3 to 4 months Prescriptions: PREDNISONE 10 MG TABS (PREDNISONE) 3 pills for 2 days, 2 pills for 2 days, 1 pill for 2 days, 1/2 pill for 2 days  #13 x 0   Entered and Authorized by:   [name] MD   Signed by:   [name] MD on 12/05/2009   Method used:   Faxed to ...       [facility] (retail)       [address]       [place], [place]  [address]       Ph: [phone]       Fax: [phone]   RxID:   [id number] ZITHROMAX Z-PAK 250 MG TABS (AZITHROMYCIN) use as directed  #1 x 0   Entered and Authorized by:   [name] MD   Signed by:   [name] MD on 12/05/2009   Method used:   Faxed to ...       [facility] (retail)       [address]       [place], [place]  [address]       Ph: [phone]       Fax:  [phone]   RxID:   [id number]    Immunization History:  Pneumovax Immunization History:    Pneumovax:  historical (10/21/2009)

## 2010-02-22 NOTE — Medication Information (Signed)
Summary: [facility]  [facility]   Imported By: Bubba Hales 04/28/2009 07:39:03  _____________________________________________________________________  External Attachment:    Type:   Image     Comment:   External Document

## 2010-02-22 NOTE — Assessment & Plan Note (Signed)
Summary: rov//mbw   Copy to:  Renato Shin Primary Provider/Referring Provider:  Sheliah Hatch  CC:  Dyspnea/asthma follow-up.  The patient states her breathing is better. She c/o occasional cough with yellow to green mucus. 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  Her breathing has improved.  She still has some sinus congestion, and sneezing.  She is using nasonex and nasal irrigation once daily, and this helps.    She has been using pulmicort two times a day, and advair.  She uses albuterol 3 or 4 times per day, and this helps.  She has occasional cough with yellow sputum.  She gets occasional wheeze.  She feels like something is stuck in her throat.  She denies fever or hemoptysis.  She was recently started on oxygen at night.  She continues to have trouble with snoring, witnessed apnea, sleep disruption, and daytime sleepiness.  PFT from Feb. 23 showed bronchodilator responsiveness.  She was not able to do all the test manuveurs.  Current Medications (verified): 1)  Caltrate 600+d 600-400 Mg-Unit  Tabs (Calcium Carbonate-Vitamin D) .... Take 1 By Mouth Qd 2)  Simvastatin 80 Mg  Tabs (Simvastatin) .... Take 1 By Mouth Qd 3)  Norvasc 5 Mg  Tabs (Amlodipine Besylate) .... Take 1 By Mouth Qd 4)  Benicar Hct 40-25 Mg  Tabs (Olmesartan Medoxomil-Hctz) .... Take 1 By Mouth Once Daily Qd 5)  Celexa 20 Mg  Tabs (Citalopram Hydrobromide) .... Take 1 By Mouth Qd 6)  Omeprazole 40 Mg  Cpdr (Omeprazole) .Marland Kitchen.. 1 Each Day 30 Minutes Before Meal 7)  Zetia 10 Mg  Tabs (Ezetimibe) .... Take 1 By Mouth Qd 8)  Furosemide 80 Mg  Tabs (Furosemide) .... Take 1 By Mouth Two Times A Day 9)  Evista 60 Mg  Tabs (Raloxifene Hcl) .... Take 1 Q Am 10)  Potassium Chloride Crys Cr 20 Meq  Tbcr (Potassium Chloride Crys Cr) .... Take 2 By Mouth Qd 11)  Glucophage 500 Mg  Tabs  (Metformin Hcl) .... Take 2 By Mouth Bid 12)  Lopressor 50 Mg  Tabs (Metoprolol Tartrate) .... Take 1 By Mouth Bid 13)  Lidoderm 5 %  Ptch (Lidocaine) .... Qd 14)  Metoclopramide Hcl 10 Mg  Tabs (Metoclopramide Hcl) .... Take 1 Tablet By Mouth 4 Times Daily. 71 Minutes Before [employer] and At Bedtime. 15)  Lidex 0.05 %  Crea (Fluocinonide) .... Three Times A Day As Needed Itching  Disp 1 Med Tube 16)  Cardizem Cd 240 Mg  Xr24h-Cap (Diltiazem Hcl Coated Beads) .... Qd 17)  Hydralazine Hcl 10 Mg  Tabs (Hydralazine Hcl) .... Tid 18)  Tramadol Hcl 50 Mg Tabs (Tramadol Hcl) .Marland Kitchen.. 1 Q4h As Needed Pain 19)  Levemir Flexpen 100 Unit/ml Soln (Insulin Detemir) .... 60 Units Qam 20)  Aspirin 81 Mg .... Once Daily 21)  Nasonex 50 Mcg/act Susp (Mometasone Furoate) .... Two Sprays Once Daily 22)  Pulmicort 0.5 Mg/41ml Susp (Budesonide) .... One Vial Nebulized Two Times A Day 23)  Albuterol Sulfate (2.5 Mg/50ml) 0.083% Nebu (Albuterol Sulfate) .... One Vial Nebulized Up To Four Times Per Day As Needed  Allergies (verified): 1)  ! Phenergan  Past History:  Past Medical History: Allergic rhinitis Asthma      - Spirometry 03/15/09 FEV1 1.35(61%), FVC 1.72(57%), FEV1% 78, +BD Depression Diabetes mellitus, type II GERD Hypertension Hyperlipidemia Osteoarthritis Osteoporosis Hair Loss Arthritis  Left CTS Non-Cardiogenic Morbid Obesity Mild Anemia w/u NEG Mild CAD CVA on MRI Diverticulosis Esophageal Stricture Gastritis, unspecified Gastroparesis Adenomatous Colon Polyps Sickle cell trait  Past Surgical History: Reviewed history from 08/09/2006 and no changes required. Hysterectomy ERD (08/08/2000) Left TKR (1998) EDG (06/25/2006) Cardiac Catherization (07/28/2003) Rest Cardiolite (07/12/2003) EKG (03/01/2005 DEXA (11/2005)  Vital Signs:  Patient profile:   65 year old female Height:      64 inches (162.56 cm) Weight:      283.50 pounds (128.86 kg) BMI:     48.84 O2 Sat:       96 % on Room air Temp:     98.6 degrees F (37.00 degrees C) oral Pulse rate:   80 / minute BP sitting:   144 / 70  (left arm) Cuff size:   large  Vitals Entered By: Francesca Jewett CMA (April 18, 2009 1:41 PM)  O2 Sat at Rest %:  96 O2 Flow:  Room air  Physical Exam  General:  healthy appearing and obese.   Nose:  no deformity, discharge, inflammation, or lesions Mouth:  MP 3, no oral lesions Neck:  no JVD.   Lungs:  diminished breath sounds, no wheezing Heart:  Regular rate and rhythm without murmurs or gallops noted. Normal S1,S2.   Extremities:  no clubbing, cyanosis, edema, or deformity noted Cervical Nodes:  no significant adenopathy   Impression & Recommendations:  Problem # 1:  DYSPNEA (ICD-786.05) Likely from asthma, obesity, deconditioning, and probable diastolic dysfunction.  Will continue to optimize her pulmonary regimen.  Explained that there is no pulmonary contra-indication to her starting an exercise routine.  Explained that weight loss is key to improving her overall health.  Problem # 2:  HYPERSOMNIA (ICD-780.54) Will schedule sleep test to further evaluate possibilty of sleep apnea.  Problem # 3:  ASTHMA (ICD-493.90) She does have bronchodilator responsiveness on spirometry.  Will resume advair 250/50, and as needed albuterol.  Problem # 4:  ALLERGIC RHINITIS (ICD-477.9) She is to continue nasal irrigation and nasonex.  Problem # 5:  GERD (ICD-530.81) She is on PPI per primary care.  Problem # 6:  PULMONARY HYPERTENSION (ICD-416.8)  She had elevated pulmonary pressures during her recent hospitalization on Echo.  I suspect this is secondary to possible obstructive lung disease and sleep disordered breathing.  Would not pursue specific interventions for her pulmonary hypertension until her other respiratory conditions are better addressed.  Medications Added to Medication List This Visit: 1)  Advair Diskus 250-50 Mcg/dose Aepb (Fluticasone-salmeterol) ....  One puff two times a day  Complete Medication List: 1)  Albuterol Sulfate (2.5 Mg/26ml) 0.083% Nebu (Albuterol sulfate) .... One vial nebulized up to four times per day as needed 2)  Nasonex 50 Mcg/act Susp (Mometasone furoate) .... Two sprays once daily 3)  Caltrate 600+d 600-400 Mg-unit Tabs (Calcium carbonate-vitamin d) .... Take 1 by mouth qd 4)  Simvastatin 80 Mg Tabs (Simvastatin) .... Take 1 by mouth qd 5)  Norvasc 5 Mg Tabs (Amlodipine besylate) .... Take 1 by mouth qd 6)  Benicar Hct 40-25 Mg Tabs (Olmesartan medoxomil-hctz) .... Take 1 by mouth once daily qd 7)  Celexa 20 Mg Tabs (Citalopram hydrobromide) .... Take 1 by mouth qd 8)  Omeprazole 40 Mg Cpdr (Omeprazole) .Marland Kitchen.. 1 each day 30 minutes before meal 9)  Zetia 10 Mg Tabs (Ezetimibe) .... Take 1 by mouth qd 10)  Furosemide 80 Mg Tabs (Furosemide) .... Take 1 by mouth two times a day 11)  Evista 60  Mg Tabs (Raloxifene hcl) .... Take 1 q am 12)  Potassium Chloride Crys Cr 20 Meq Tbcr (Potassium chloride crys cr) .... Take 2 by mouth qd 13)  Glucophage 500 Mg Tabs (Metformin hcl) .... Take 2 by mouth bid 14)  Lopressor 50 Mg Tabs (Metoprolol tartrate) .... Take 1 by mouth bid 15)  Lidoderm 5 % Ptch (Lidocaine) .... Qd 16)  Metoclopramide Hcl 10 Mg Tabs (Metoclopramide hcl) .... Take 1 tablet by mouth 4 times daily. 30 minutes before breakfast,lunch dinner and at bedtime. 17)  Lidex 0.05 % Crea (Fluocinonide) .... Three times a day as needed itching  disp 1 med tube 18)  Cardizem Cd 240 Mg Xr24h-cap (Diltiazem hcl coated beads) .... Qd 19)  Hydralazine Hcl 10 Mg Tabs (Hydralazine hcl) .... Tid 20)  Tramadol Hcl 50 Mg Tabs (Tramadol hcl) .Marland Kitchen.. 1 q4h as needed pain 21)  Levemir Flexpen 100 Unit/ml Soln (Insulin detemir) .... 60 units qam 22)  Aspirin 81 Mg  .... Once daily 23)  Advair Diskus 250-50 Mcg/dose Aepb (Fluticasone-salmeterol) .... One puff two times a day  Other Orders: Est. Patient Level III [id number]) Sleep Disorder  Referral (Sleep Disorder)  Patient Instructions: 1)  Stop pulmicort 2)  Advair 250/50 one puff two times a day, rinse mouth after each use 3)  Albutrol nebulizer up to four times per day for cough, wheeze, congestion 4)  Will schedule sleep test 5)  Salt water spray for nose (nasal irrigation) once daily 6)  Nasonex two sprays once daily  7)  Will call to schedule follow up after sleep test reviewed Prescriptions: ADVAIR DISKUS 250-50 MCG/DOSE AEPB (FLUTICASONE-SALMETEROL) one puff two times a day  #1 x 3   Entered and Authorized by:   [name] MD   Signed by:   [name] MD on 04/18/2009   Method used:   Print then Give to Patient   RxID:   [id number]

## 2010-02-22 NOTE — Medication Information (Signed)
Summary: Order for Diabetic Supplies / [facility]  Order for Diabetic Supplies / [facility]   Imported By: Rise Patience 12/06/2009 15:02:59  _____________________________________________________________________  External Attachment:    Type:   Image     Comment:   External Document

## 2010-02-22 NOTE — Medication Information (Signed)
Summary: Glucose Testing Supplies/[facility]  Glucose Testing Supplies/[facility]   Imported By: Phillis Knack 07/12/2009 11:09:03  _____________________________________________________________________  External Attachment:    Type:   Image     Comment:   External Document

## 2010-02-22 NOTE — Medication Information (Signed)
Summary: Humulin Approved/[employer]  Humulin Approved/[employer]   Imported By: Phillis Knack 07/17/2009 10:49:25  _____________________________________________________________________  External Attachment:    Type:   Image     Comment:   External Document

## 2010-02-22 NOTE — Medication Information (Signed)
Summary: Diabetes Testing Supplies/[facility]  Diabetes Testing Supplies/[facility]   Imported By: Phillis Knack 06/28/2009 12:32:28  _____________________________________________________________________  External Attachment:    Type:   Image     Comment:   External Document

## 2010-02-23 NOTE — Medication Information (Signed)
Summary: [employer] for Humulin/[employer]  [facility] [employer] for Humulin/[employer]   Imported By: Phillis Knack 06/08/2009 14:46:20  _____________________________________________________________________  External Attachment:    Type:   Image     Comment:   External Document

## 2010-02-23 NOTE — Medication Information (Signed)
Summary: [facility]  [facility]   Imported By: Bubba Hales 05/26/2009 09:23:21  _____________________________________________________________________  External Attachment:    Type:   Image     Comment:   External Document

## 2010-02-23 NOTE — Medication Information (Signed)
Summary: [facility]  [facility]   Imported By: Phillis Knack 03/01/2009 09:55:38  _____________________________________________________________________  External Attachment:    Type:   Image     Comment:   External Document

## 2010-02-23 NOTE — Procedures (Signed)
Summary: [facility]   Imported By: Bubba Hales 03/14/2009 10:59:28  _____________________________________________________________________  External Attachment:    Type:   Image     Comment:   External Document

## 2010-02-23 NOTE — Medication Information (Signed)
Summary: [facility]  Glucose Testing Supplies/[facility]   Imported By: Phillis Knack 05/29/2009 12:20:21  _____________________________________________________________________  External Attachment:    Type:   Image     Comment:   External Document  Appended Document: [facility] refaxed

## 2010-03-01 ENCOUNTER — Other Ambulatory Visit: Payer: MEDICARE

## 2010-03-01 ENCOUNTER — Other Ambulatory Visit: Payer: Self-pay | Admitting: Pulmonary Disease

## 2010-03-01 ENCOUNTER — Encounter: Payer: Self-pay | Admitting: Pulmonary Disease

## 2010-03-01 ENCOUNTER — Encounter (INDEPENDENT_AMBULATORY_CARE_PROVIDER_SITE_OTHER): Payer: Self-pay | Admitting: *Deleted

## 2010-03-01 ENCOUNTER — Ambulatory Visit (INDEPENDENT_AMBULATORY_CARE_PROVIDER_SITE_OTHER): Payer: MEDICARE | Admitting: Pulmonary Disease

## 2010-03-01 ENCOUNTER — Ambulatory Visit (INDEPENDENT_AMBULATORY_CARE_PROVIDER_SITE_OTHER)
Admission: RE | Admit: 2010-03-01 | Discharge: 2010-03-01 | Disposition: A | Discharge status: Home / Self Care | Payer: MEDICARE | Source: Ambulatory Visit | Attending: Pulmonary Disease | Admitting: Pulmonary Disease

## 2010-03-01 DIAGNOSIS — J45909 Unspecified asthma, uncomplicated: Secondary | ICD-10-CM

## 2010-03-01 DIAGNOSIS — J309 Allergic rhinitis, unspecified: Secondary | ICD-10-CM

## 2010-03-01 DIAGNOSIS — J189 Pneumonia, unspecified organism: Secondary | ICD-10-CM

## 2010-03-01 DIAGNOSIS — G4733 Obstructive sleep apnea (adult) (pediatric): Secondary | ICD-10-CM

## 2010-03-01 DIAGNOSIS — R0602 Shortness of breath: Secondary | ICD-10-CM

## 2010-03-01 LAB — HEPATIC FUNCTION PANEL
ALT: 21 U/L (ref 0–35)
AST: 18 U/L (ref 0–37)
Albumin: 3.2 g/dL — ABNORMAL LOW (ref 3.5–5.2)

## 2010-03-01 LAB — BASIC METABOLIC PANEL
Chloride: 103 mEq/L (ref 96–112)
GFR: 69.99 mL/min (ref >60.00)
Glucose, Bld: 116 mg/dL — ABNORMAL HIGH (ref 70–99)
Potassium: 4.4 mEq/L (ref 3.5–5.1)
Sodium: 139 mEq/L (ref 135–145)

## 2010-03-01 LAB — CBC WITH DIFFERENTIAL/PLATELET
Eosinophils Relative: 1.8 % (ref 0.0–5.0)
HCT: 32.4 % — ABNORMAL LOW (ref 36.0–46.0)
Hemoglobin: 10.4 g/dL — ABNORMAL LOW (ref 12.0–15.0)
Lymphs Abs: 1.8 10*3/uL (ref 0.7–4.0)
Monocytes Relative: 5.6 % (ref 3.0–12.0)
Neutro Abs: 5.9 10*3/uL (ref 1.4–7.7)
RDW: 15.6 % — ABNORMAL HIGH (ref 11.5–14.6)
WBC: 8.3 10*3/uL (ref 4.5–10.5)

## 2010-03-02 ENCOUNTER — Encounter: Payer: Self-pay | Admitting: Pulmonary Disease

## 2010-03-08 IMAGING — CR DG CHEST 2V
2 series · 2 of 2 positions shown · non-contrast
Comparison: 07/05/2017

CLINICAL DATA: Shortness of breath

CHEST - 2 VIEW

[w chest pa]
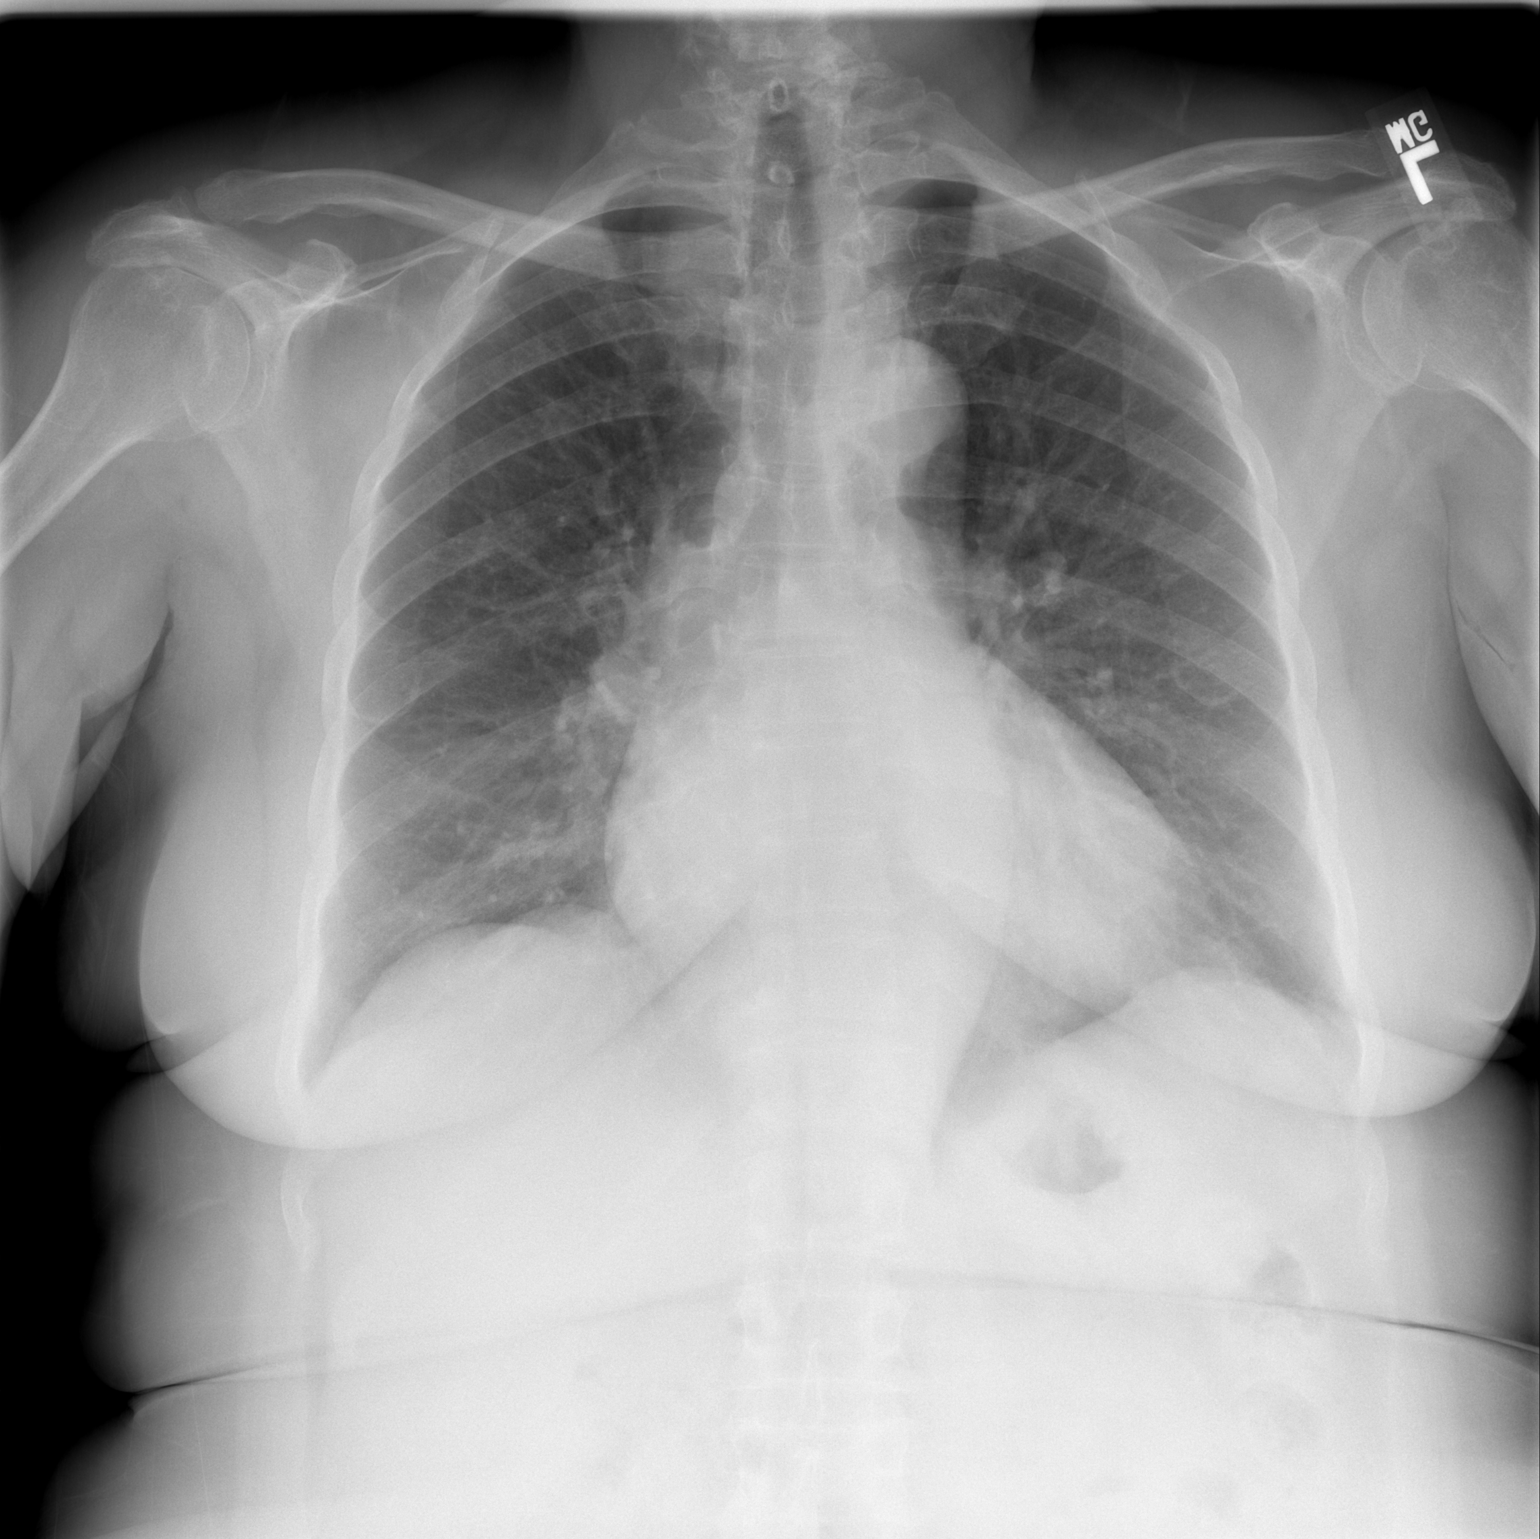

[w chest lat]
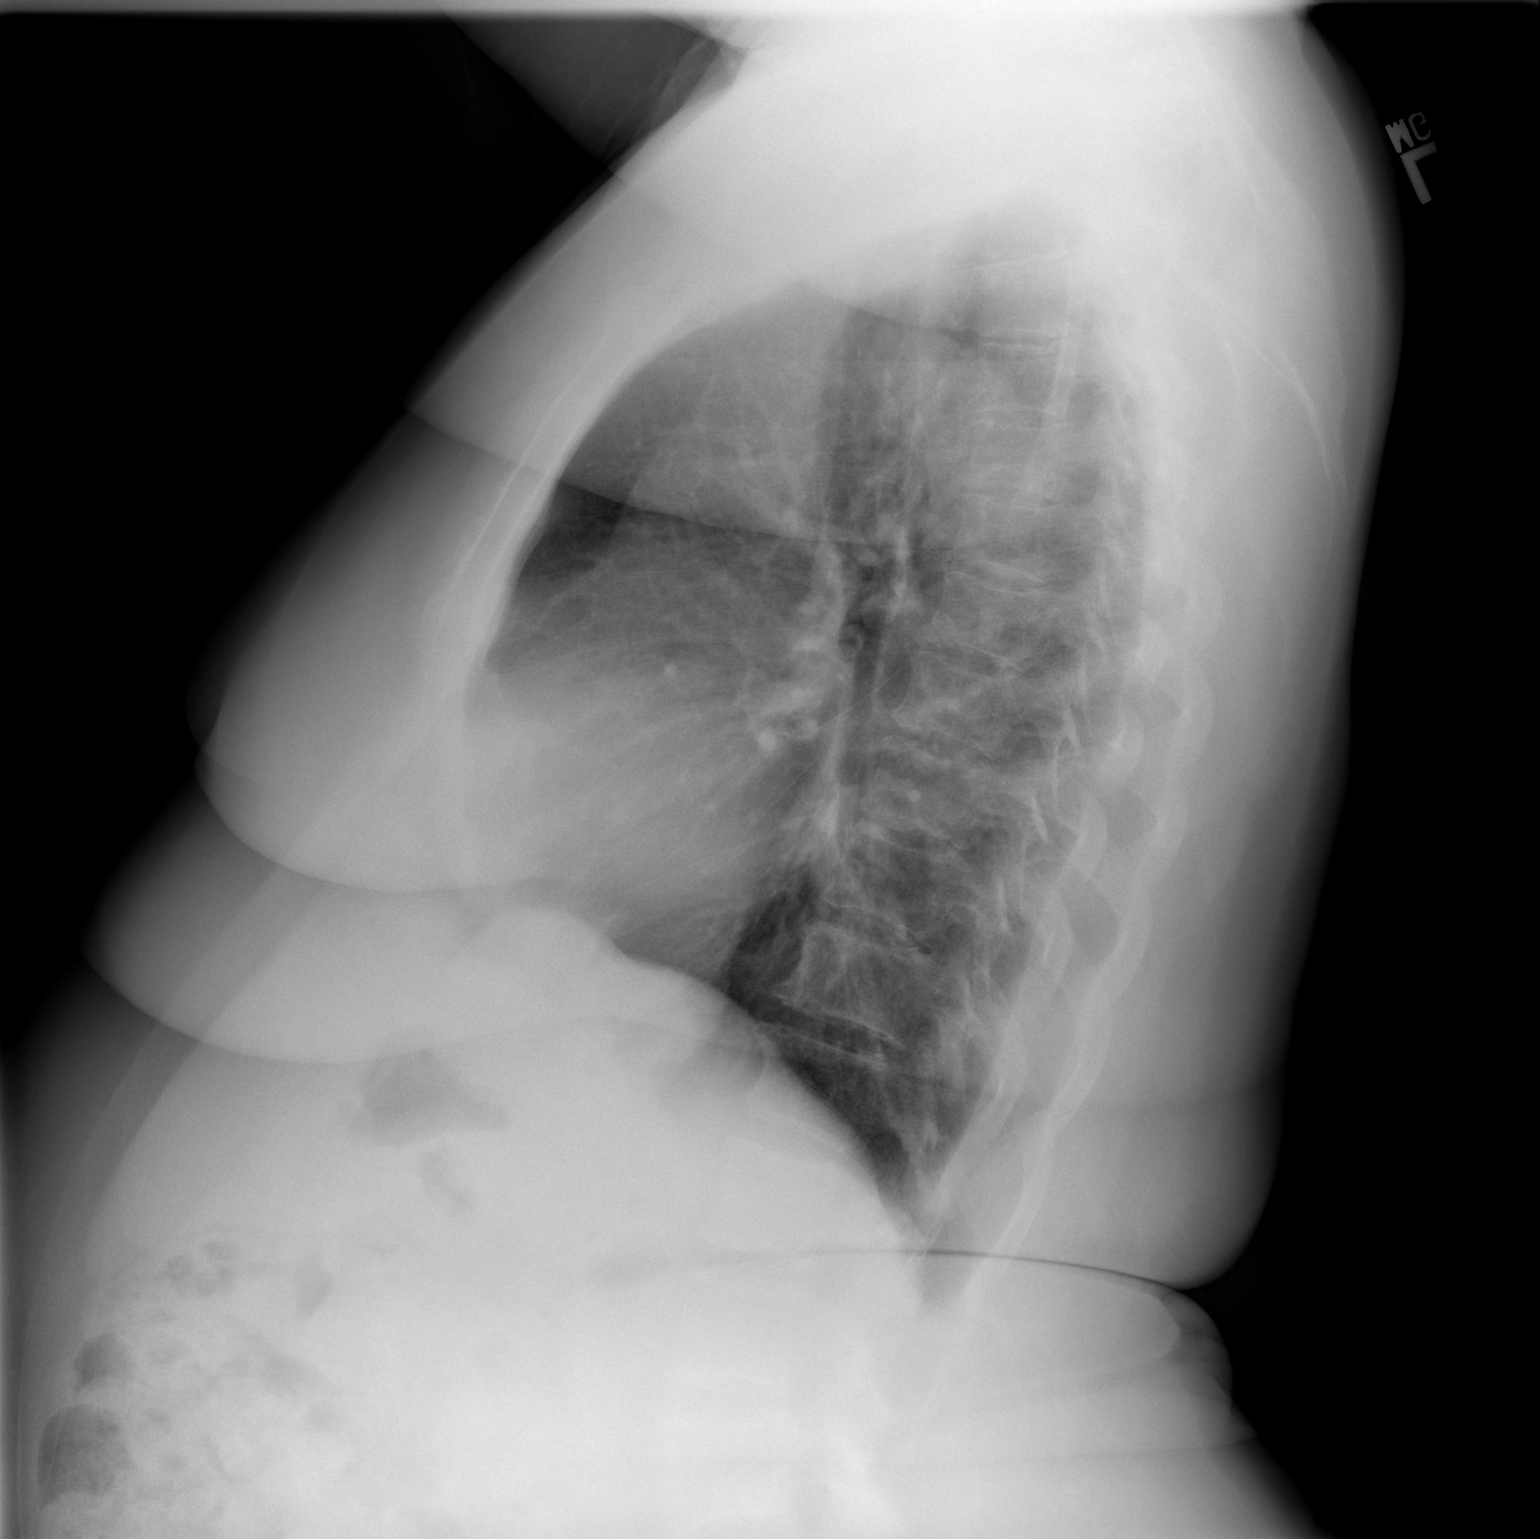

[2 of 2 positions shown; findings below may reference images not displayed]

FINDINGS: There is mild cardiac enlargement.

No pleural effusion or pulmonary edema noted.

No airspace consolidation identified.

There are mildly coarsened interstitial markings.
IMPRESSION: 1. Mild cardiac enlargement.
2.  No acute findings.

## 2010-03-08 NOTE — Assessment & Plan Note (Signed)
Summary: 4 mth rov//kp   Copy to:  [facility] Primary Provider/Referring Provider:  Jana Hakim, MD  CC:  4 month follow up. Pt states she has had an increased of sob. Pt c/o cough w/ yellow to light green phlem, chest tightness, little wheezing, post nasal drip, and pressure behind her left ear as well as an ear ache x 1 month. Pt states she wears her cpap everynight x 4 hrs a night. 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  Her breathing is doing better.  She still has cough with thick yellow to green sputum.  She denies wheeze or fever.  She is using her nebulizer two times a day, and this helps.  She is not having chest pain.  She is using CPAP about 4 to 5 hours per night, and htis has helped.  She still has some sinus congestion.   Current Medications (verified): 1)  Advair Diskus 250-50 Mcg/dose Aepb (Fluticasone-Salmeterol) .... One Puff Two Times A Day 2)  Albuterol Sulfate (2.5 Mg/15ml) 0.083% Nebu (Albuterol Sulfate) .... One Vial Nebulized Up To Four Times Per Day As Needed 3)  Fluticasone Propionate 50 Mcg/act Susp (Fluticasone Propionate) .... 2 Sprays in Each Nostril Daily 4)  Caltrate 600+d 600-400 Mg-Unit  Tabs (Calcium Carbonate-Vitamin D) .... Take 1 By Mouth Once Daily 5)  Aspirin 81 Mg Tabs (Aspirin) .... Once Daily 6)  Cardizem Cd 240 Mg  Xr24h-Cap (Diltiazem Hcl Coated Beads) .... Once Daily 7)  Hydralazine Hcl 10 Mg  Tabs (Hydralazine Hcl) .... Three Times A Day 8)  Furosemide 80 Mg  Tabs (Furosemide) .... Take 1 By Mouth Two Times A Day 9)  Potassium Chloride Crys Cr 20 Meq  Tbcr (Potassium Chloride Crys Cr) .... 2 By Mouth Two Times A Day 10)  Zetia 10 Mg  Tabs (Ezetimibe) .... Take 1 By Mouth Once Daily 11)  Humulin N 100 Unit/ml Susp (Insulin Isophane Human) .... 55  Units Each Am, and 10 Units in The Evening, and Syringes Two Times A Day 12)  Celexa 20 Mg  Tabs (Citalopram Hydrobromide) .... Take 1 By Mouth Once Daily 13)  Tramadol Hcl 50 Mg Tabs (Tramadol Hcl) .Joann Gutierrez.. 1 Q4h As Needed Pain 14)  Lidoderm 5 %  Ptch (Lidocaine) .... Once Daily 15)  Omeprazole 40 Mg  Cpdr (Omeprazole) .Joann Gutierrez.. 1 Each Day 30 Minutes Before Meal 16)  Metoclopramide Hcl 10 Mg  Tabs (Metoclopramide Hcl) .... Take 1 Tablet By Mouth 4 Times Daily. 66 Minutes Before [employer] and At Bedtime. 17)  Evista 60 Mg  Tabs (Raloxifene Hcl) .... Take 1 Once Daily 18)  Lidex 0.05 %  Crea (Fluocinonide) .... Three Times A Day As Needed Itching  Disp 1 Med Tube 19)  Bd Insulin Syringe Ultrafine 31g X 5/16" 0.3 Ml Misc (Insulin Syringe-Needle U-100) .... Use As Directed Once Daily 20)  Neurontin 300 Mg Caps (Gabapentin) .Joann Gutierrez.. 1 By Mouth Two Times A Day 21)  Metoprolol Tartrate 50 Mg Tabs (Metoprolol Tartrate) .Joann Gutierrez.. 1 By Mouth Two Times A Day 22)  Multivitamins   Tabs (Multiple Vitamin) .Joann Gutierrez.. 1 By Mouth Daily 23)  Lipitor 40 Mg Tabs (Atorvastatin Calcium) .... One Daily  Allergies (verified): 1)  ! Phenergan  Past History:  Past Medical History: Reviewed history from 07/28/2009 and no changes required. Allergic rhinitis  Asthma      - Spirometry 03/15/09 FEV1 1.35(61%), FVC 1.72(57%), FEV1% 78, +BD OSA      - PSG 05/08/09 AHI 11, RDI 21      - CPAP 9 cm H2O Depression Diabetes mellitus, type II since 1997 GERD Hypertension x years Hyperlipidemia x years Osteoarthritis Osteoporosis Hair Loss Arthritis Left CTS Non-Cardiogenic Morbid Obesity Mild Anemia w/u NEG Mild CAD  very minimal coronary disease with 20% obtuse   marginal stenosis. CVA on MRI Diverticulosis Esophageal Stricture Gastritis, unspecified Gastroparesis Adenomatous Colon Polyps Sickle cell trait  Past Surgical History: Reviewed history from 07/17/2009 and no changes required. Hysterectomy ERD  (08/08/2000) Left TKR (1998) EDG (06/25/2006) DEXA (11/2005) Lumpectomies (benign)  Vital Signs:  Patient profile:   65 year old female Height:      65 inches Weight:      299.13 pounds BMI:     49.96 O2 Sat:      96 % on Room air Temp:     98.3 degrees F oral Pulse rate:   92 / minute BP sitting:   138 / 72  (left arm) Cuff size:   large  Vitals Entered By: Charma Igo (March 01, 2010 11:10 AM)  O2 Flow:  Room air CC: 4 month follow up. Pt states she has had an increased of sob. Pt c/o cough w/ yellow to light green phlem, chest tightness, little wheezing, post nasal drip, pressure behind her left ear as well as an ear ache x 1 month. Pt states she wears her cpap everynight x 4 hrs a night. Pt states she is having no problems with her machine/mask. Comments meds and allergies updated Phone number updated Charma Igo  March 01, 2010 11:11 AM    Physical Exam  General:  normal appearance and obese.   Nose:  clear nasal discharge, no tenderness Mouth:  MP 3, no oral lesions Neck:  no JVD.   Lungs:  diminished breath sounds, prolonged exhalation, no wheeze or rales Heart:  Regular rate and rhythm without murmurs or gallops noted.  Extremities:  no clubbing, cyanosis, edema, or deformity noted Neurologic:  normal CN II-XII and strength normal.   Cervical Nodes:  no significant adenopathy Psych:  Alert and cooperative; normal mood and affect; normal attention span and concentration.     Impression & Recommendations:  Problem # 1:  DYSPNEA (ICD-786.05)  Related to asthma, obesity, deconditioning.  Problem # 2:  OBSTRUCTIVE SLEEP APNEA (ICD-327.23)  Encouraged her to maintain her compliance with CPAP.  Problem # 3:  ASTHMA (ICD-493.90) Continue advair and as needed albuterol.  Will add zafirlukast.  Problem # 4:  ALLERGIC RHINITIS (ICD-477.9) She is to continue flonase.  Will add zafirlukast.  Problem # 5:  PNEUMONIA, ORGANISM UNSPECIFIED (ICD-486) She has  improved clinically.  Will repeat chest xray today.  If she has persistent infiltrates, then will get CT chest.  Medications Added to Medication List This Visit: 1)  Zafirlukast 20 Mg Tabs (Zafirlukast) .... One two times a day  Complete Medication List: 1)  Advair Diskus 250-50 Mcg/dose Aepb (Fluticasone-salmeterol) .... One puff two times a day 2)  Albuterol Sulfate (2.5 Mg/72ml) 0.083% Nebu (Albuterol sulfate) .... One vial nebulized up to four times per day as needed 3)  Fluticasone Propionate 50 Mcg/act Susp (Fluticasone propionate) .... 2 sprays in each nostril daily 4)  Caltrate 600+d 600-400 Mg-unit Tabs (Calcium carbonate-vitamin d) .... Take 1 by mouth once daily 5)  Aspirin 81 Mg Tabs (Aspirin) .Joann KitchenMarland KitchenMarland Gutierrez  Once daily 6)  Cardizem Cd 240 Mg Xr24h-cap (Diltiazem hcl coated beads) .... Once daily 7)  Hydralazine Hcl 10 Mg Tabs (Hydralazine hcl) .... Three times a day 8)  Furosemide 80 Mg Tabs (Furosemide) .... Take 1 by mouth two times a day 9)  Potassium Chloride Crys Cr 20 Meq Tbcr (Potassium chloride crys cr) .... 2 by mouth two times a day 10)  Zetia 10 Mg Tabs (Ezetimibe) .... Take 1 by mouth once daily 11)  Humulin N 100 Unit/ml Susp (Insulin isophane human) .... 55 units each am, and 10 units in the evening, and syringes two times a day 12)  Celexa 20 Mg Tabs (Citalopram hydrobromide) .... Take 1 by mouth once daily 13)  Tramadol Hcl 50 Mg Tabs (Tramadol hcl) .Joann Gutierrez.. 1 q4h as needed pain 14)  Lidoderm 5 % Ptch (Lidocaine) .... Once daily 15)  Omeprazole 40 Mg Cpdr (Omeprazole) .Joann Gutierrez.. 1 each day 30 minutes before meal 16)  Metoclopramide Hcl 10 Mg Tabs (Metoclopramide hcl) .... Take 1 tablet by mouth 4 times daily. 30 minutes before breakfast,lunch dinner and at bedtime. 17)  Evista 60 Mg Tabs (Raloxifene hcl) .... Take 1 once daily 18)  Lidex 0.05 % Crea (Fluocinonide) .... Three times a day as needed itching  disp 1 med tube 19)  Bd Insulin Syringe Ultrafine 31g X 5/16" 0.3 Ml Misc  (Insulin syringe-needle u-100) .... Use as directed once daily 20)  Neurontin 300 Mg Caps (Gabapentin) .Joann Gutierrez.. 1 by mouth two times a day 21)  Metoprolol Tartrate 50 Mg Tabs (Metoprolol tartrate) .Joann Gutierrez.. 1 by mouth two times a day 22)  Multivitamins Tabs (Multiple vitamin) .Joann Gutierrez.. 1 by mouth daily 23)  Lipitor 40 Mg Tabs (Atorvastatin calcium) .... One daily 24)  Zafirlukast 20 Mg Tabs (Zafirlukast) .... One two times a day  Other Orders: Est. Patient Level IV (99214) T-2 View CXR (71020TC) TLB-BMP (Basic Metabolic Panel-BMET) (99991111) TLB-CBC Platelet - w/Differential (85025-CBCD) TLB-Hepatic/Liver Function Pnl (80076-HEPATIC)  Patient Instructions: 1)  Chest xray and labs today 2)  Zafirlukast 20 mg two times a day 3)  Follow up in 4 months Prescriptions: ZAFIRLUKAST 20 MG TABS (ZAFIRLUKAST) one two times a day  #60 x 6   Entered and Authorized by:   [name] MD   Signed by:   [name] MD on 03/01/2010   Method used:   Faxed to ...       [facility] (retail)       [address]       [place], [place]  [address]       Ph: [phone]       Fax: [phone]   RxID:   [id number]

## 2010-03-14 ENCOUNTER — Ambulatory Visit (INDEPENDENT_AMBULATORY_CARE_PROVIDER_SITE_OTHER): Payer: MEDICARE | Admitting: Gastroenterology

## 2010-03-14 ENCOUNTER — Encounter: Payer: Self-pay | Admitting: Gastroenterology

## 2010-03-14 DIAGNOSIS — R1310 Dysphagia, unspecified: Secondary | ICD-10-CM

## 2010-03-14 DIAGNOSIS — R1319 Other dysphagia: Secondary | ICD-10-CM | POA: Insufficient documentation

## 2010-03-20 ENCOUNTER — Ambulatory Visit: Payer: Self-pay | Admitting: Endocrinology

## 2010-03-20 NOTE — Letter (Signed)
Summary: EGD Instructions  [facility]  [place], [place] [address]   Phone: [phone]  Fax: [phone]       Joann Gutierrez    [birth year]    MRN: [id number]       Procedure Day /Date:04/19/2010     Arrival Time: 8AM     Procedure Time:9AM     Location of Procedure:                    [facility] (4th Floor)    PREPARATION FOR ENDOSCOPY   On3/29/2012  THE DAY OF THE PROCEDURE:  1.   No solid foods, milk or milk products are allowed after midnight the night before your procedure.  2.   Do not drink anything colored red or purple.  Avoid juices with pulp.  No orange juice.  3.  You may drink clear liquids until7AM , which is 2 hours before your procedure.                                                                                                CLEAR LIQUIDS INCLUDE: Water Jello Ice Popsicles Tea (sugar ok, no milk/cream) Powdered fruit flavored drinks Coffee (sugar ok, no milk/cream) Gatorade Juice: apple, white grape, white cranberry  Lemonade Clear bullion, consomm, broth Carbonated beverages (any kind) Strained chicken noodle soup Hard Candy   MEDICATION INSTRUCTIONS  Unless otherwise instructed, you should take regular prescription medications with a small sip of water as early as possible the morning of your procedure.  Diabetic patients - see separate instructions.               OTHER INSTRUCTIONS  You will need a responsible adult at least 65 years of age to accompany you and drive you home.   This person must remain in the waiting room during your procedure.  Wear loose fitting clothing that is easily removed.  Leave jewelry and other valuables at home.  However, you may wish to bring a book to read or an iPod/MP3 player to listen to music as you wait for your procedure to start.  Remove all body piercing jewelry and leave at home.  Total time from sign-in until discharge is approximately  2-3 hours.  You should go home directly after your procedure and rest.  You can resume normal activities the day after your procedure.  The day of your procedure you should not:   Drive   Make legal decisions   Operate machinery   Drink alcohol   Return to work  You will receive specific instructions about eating, activities and medications before you leave.    The above instructions have been reviewed and explained to me by   _______________________    I fully understand and can verbalize these instructions _____________________________ Date _________

## 2010-03-20 NOTE — Assessment & Plan Note (Signed)
Summary: dysphagia    History of Present Illness Visit Type: Follow-up Visit Primary GI MD: Erskine Emery MD [facility] Primary Provider: Jana Hakim, MD Requesting Provider: Renato Shin, MD Chief Complaint: dysphagia History of Present Illness:    Joann Gutierrez has returned for reevaluation of dysphagia. She has a history of esophageal stricture and was last dilated in 2010.  She did well until the past couple of months, when she  again noted dysphagia to solids.   GI Review of Systems    Reports dysphagia with solids.      Denies abdominal pain, acid reflux, belching, bloating, chest pain, dysphagia with liquids, heartburn, loss of appetite, nausea, vomiting, vomiting blood, weight loss, and  weight gain.        Denies anal fissure, black tarry stools, change in bowel habit, constipation, diarrhea, diverticulosis, fecal incontinence, heme positive stool, hemorrhoids, irritable bowel syndrome, jaundice, light color stool, liver problems, rectal bleeding, and  rectal pain.    Current Medications (verified): 1)  Advair Diskus 250-50 Mcg/dose Aepb (Fluticasone-Salmeterol) .... One Puff Two Times A Day 2)  Albuterol Sulfate (2.5 Mg/37ml) 0.083% Nebu (Albuterol Sulfate) .... One Vial Nebulized Up To Four Times Per Day As Needed 3)  Fluticasone Propionate 50 Mcg/act Susp (Fluticasone Propionate) .... 2 Sprays in Each Nostril Daily 4)  Caltrate 600+d 600-400 Mg-Unit  Tabs (Calcium Carbonate-Vitamin D) .... Take 1 By Mouth Once Daily 5)  Aspirin 81 Mg Tabs (Aspirin) .... Once Daily 6)  Cardizem Cd 240 Mg  Xr24h-Cap (Diltiazem Hcl Coated Beads) .... Once Daily 7)  Hydralazine Hcl 10 Mg  Tabs (Hydralazine Hcl) .... Three Times A Day 8)  Furosemide 80 Mg  Tabs (Furosemide) .... Take 1 By Mouth Two Times A Day 9)  Potassium Chloride Crys Cr 20 Meq  Tbcr (Potassium Chloride Crys Cr) .... 2 By Mouth Two Times A Day 10)  Zetia 10 Mg  Tabs (Ezetimibe) .... Take 1 By Mouth Once Daily 11)  Humulin N  100 Unit/ml Susp (Insulin Isophane Human) .... 55 Units Each Am, and 10 Units in The Evening, and Syringes Two Times A Day 12)  Celexa 20 Mg  Tabs (Citalopram Hydrobromide) .... Take 1 By Mouth Once Daily 13)  Tramadol Hcl 50 Mg Tabs (Tramadol Hcl) .Marland Kitchen.. 1 Q4h As Needed Pain 14)  Lidoderm 5 %  Ptch (Lidocaine) .... Once Daily 15)  Omeprazole 40 Mg  Cpdr (Omeprazole) .Marland Kitchen.. 1 Each Day 30 Minutes Before Meal 16)  Metoclopramide Hcl 10 Mg  Tabs (Metoclopramide Hcl) .... Take 1 Tablet By Mouth 4 Times Daily. 58 Minutes Before [employer] and At Bedtime. 17)  Evista 60 Mg  Tabs (Raloxifene Hcl) .... Take 1 Once Daily 18)  Lidex 0.05 %  Crea (Fluocinonide) .... Three Times A Day As Needed Itching  Disp 1 Med Tube 19)  Bd Insulin Syringe Ultrafine 31g X 5/16" 0.3 Ml Misc (Insulin Syringe-Needle U-100) .... Use As Directed Once Daily 20)  Neurontin 300 Mg Caps (Gabapentin) .Marland Kitchen.. 1 By Mouth Two Times A Day 21)  Metoprolol Tartrate 50 Mg Tabs (Metoprolol Tartrate) .Marland Kitchen.. 1 By Mouth Two Times A Day 22)  Multivitamins   Tabs (Multiple Vitamin) .Marland Kitchen.. 1 By Mouth Daily 23)  Lipitor 40 Mg Tabs (Atorvastatin Calcium) .... One Daily 24)  Zafirlukast 20 Mg Tabs (Zafirlukast) .... One Two Times A Day  Allergies (verified): 1)  ! Phenergan  Past History:  Past Medical History: Reviewed history from 07/28/2009 and no changes required. Allergic rhinitis Asthma      -  Spirometry 03/15/09 FEV1 1.35(61%), FVC 1.72(57%), FEV1% 78, +BD OSA      - PSG 05/08/09 AHI 11, RDI 21      - CPAP 9 cm H2O Depression Diabetes mellitus, type II since 1997 GERD Hypertension x years Hyperlipidemia x years Osteoarthritis Osteoporosis Hair Loss Arthritis Left CTS Non-Cardiogenic Morbid Obesity Mild Anemia w/u NEG Mild CAD  very minimal coronary disease with 20% obtuse   marginal stenosis. CVA on MRI Diverticulosis Esophageal Stricture Gastritis, unspecified Gastroparesis Adenomatous Colon Polyps Sickle cell  trait  Past Surgical History: Reviewed history from 07/17/2009 and no changes required. Hysterectomy ERD (08/08/2000) Left TKR (1998) EDG (06/25/2006) DEXA (11/2005) Lumpectomies (benign)  Family History: Reviewed history from 07/17/2009 and no changes required. Family History of Colon Cancer: brother ? Family History of Liver Cancer: mother Family History of Diabetes: Mother,  2 sisters Family History of Heart Disease: mother (age 62s), sisters (age 107s), father (died of MI when patient was 77) Family History of Kidney Disease: mother, 2 sisters allergies: sister father: sickle cell   Social History: Reviewed history from 07/17/2009 and no changes required. Widowed. 1 Child Disabled. prev worked as a Electrical engineer.  Patient is a former smoker.  started at age 80.  2 ppd.  quit approx 1981. Daily Caffeine Use coffee and tea Alcohol Use - no Illicit Drug Use - no  Review of Systems       The patient complains of cough, sore throat, and voice change.  The patient denies allergy/sinus, anemia, anxiety-new, arthritis/joint pain, back pain, blood in urine, breast changes/lumps, change in vision, confusion, coughing up blood, depression-new, fainting, fatigue, fever, headaches-new, hearing problems, heart murmur, heart rhythm changes, itching, menstrual pain, muscle pains/cramps, night sweats, nosebleeds, pregnancy symptoms, shortness of breath, skin rash, sleeping problems, swelling of feet/legs, swollen lymph glands, thirst - excessive , urination - excessive , urination changes/pain, and urine leakage.         All other systems were reviewed and were negative   Vital Signs:  Patient profile:   65 year old female Height:      65 inches Weight:      295 pounds BMI:     49.27 Pulse rate:   80 / minute Pulse rhythm:   regular BP sitting:   132 / 70  (left arm)  Vitals Entered By: Randye Lobo NCMA (March 14, 2010 2:57 PM)  Physical Exam  Additional Exam:  Physical  Exam: General:  She is an obese female HEENT:   anicteric.  No pharyngeal abnormalities Neck:   No masses, thyroidmegaly Nodes:   No cervical, axillary, inguinal adenopathy Chest:    Clear to auscultation Cardiac:   No murmurs, gallops, rubs Abdomen:   BS active.  No abd masses, tenderness, organomegaly Rectal:   Deferred Extremities:   No cyanosis, clubbing, edema Skeletal:   No deformities Neuro:   Alert, oriented x3.  No focal abnormalities     Impression & Recommendations:  Problem # 1:  DYSPHAGIA UNSPECIFIED (ICD-787.20) Assessment Deteriorated  Plan upper endoscopy with dilatation as indicated for what is likely a recurrent  stricture.  Problem # 2:  OBSTRUCTIVE SLEEP APNEA (ICD-327.23) Assessment: Comment Only  Other Orders: EGD SAV (EGD SAV)  Patient Instructions: 1)  Copy sent to : Jana Hakim, MD;Sean Loanne Drilling, MD 2)  Your EGD is scheduled on 04/19/2010 at 9am 3)  Conscious Sedation brochure given.  4)  Upper Endoscopy brochure given.  5)  Upper Endoscopy with Dilatation brochure given.  6)  The medication list was reviewed and reconciled.  All changed / newly prescribed medications were explained.  A complete medication list was provided to the patient / caregiver.

## 2010-03-20 NOTE — Letter (Signed)
Summary: Diabetic Instructions  [facility]  [place], [place] [address]   Phone: [phone]  Fax: [phone]    Joann Gutierrez 07-16-1945 MRN: [id number]   _  _   ORAL DIABETIC MEDICATION INSTRUCTIONS  The day before your procedure:   Take your diabetic pill as you do normally  The day of your procedure:   Do not take your diabetic pill    We will check your blood sugar levels during the admission process and again in Recovery before discharging you home  ________________________________________________________________________  X   INSULIN (LONG ACTING) MEDICATION INSTRUCTIONS (Lantus, NPH, 70/30, Humulin, Novolin-N)   The day before your procedure:   Take  your regular evening dose    The day of your procedure:   Do not take your morning dose    _  _   INSULIN (SHORT ACTING) MEDICATION INSTRUCTIONS (Regular, Humulog, Novolog)   The day before your procedure:   Do not take your evening dose   The day of your procedure:   Do not take your morning dose   _  _   INSULIN PUMP MEDICATION INSTRUCTIONS  We will contact the physician managing your diabetic care for written dosage instructions for the day before your procedure and the day of your procedure.  Once we have received the instructions, we will contact you.

## 2010-03-21 ENCOUNTER — Encounter: Payer: Self-pay | Admitting: Pulmonary Disease

## 2010-03-22 ENCOUNTER — Other Ambulatory Visit: Payer: MEDICARE

## 2010-03-22 ENCOUNTER — Other Ambulatory Visit: Payer: Self-pay | Admitting: Endocrinology

## 2010-03-22 ENCOUNTER — Encounter (INDEPENDENT_AMBULATORY_CARE_PROVIDER_SITE_OTHER): Payer: Self-pay | Admitting: *Deleted

## 2010-03-22 ENCOUNTER — Encounter: Payer: Self-pay | Admitting: Endocrinology

## 2010-03-22 ENCOUNTER — Ambulatory Visit (INDEPENDENT_AMBULATORY_CARE_PROVIDER_SITE_OTHER): Payer: MEDICARE | Admitting: Endocrinology

## 2010-03-22 DIAGNOSIS — E119 Type 2 diabetes mellitus without complications: Secondary | ICD-10-CM

## 2010-03-22 LAB — HEMOGLOBIN A1C: Hgb A1c MFr Bld: 8.5 % — ABNORMAL HIGH (ref 4.6–6.5)

## 2010-03-28 ENCOUNTER — Encounter: Payer: Self-pay | Admitting: Pulmonary Disease

## 2010-03-29 IMAGING — CR DG CHEST 2V
2 series · 2 of 2 positions shown · non-contrast
Comparison: 12/12/2008

CLINICAL DATA: Cough.

CHEST - 2 VIEW

[view not recorded (1 of 2)]
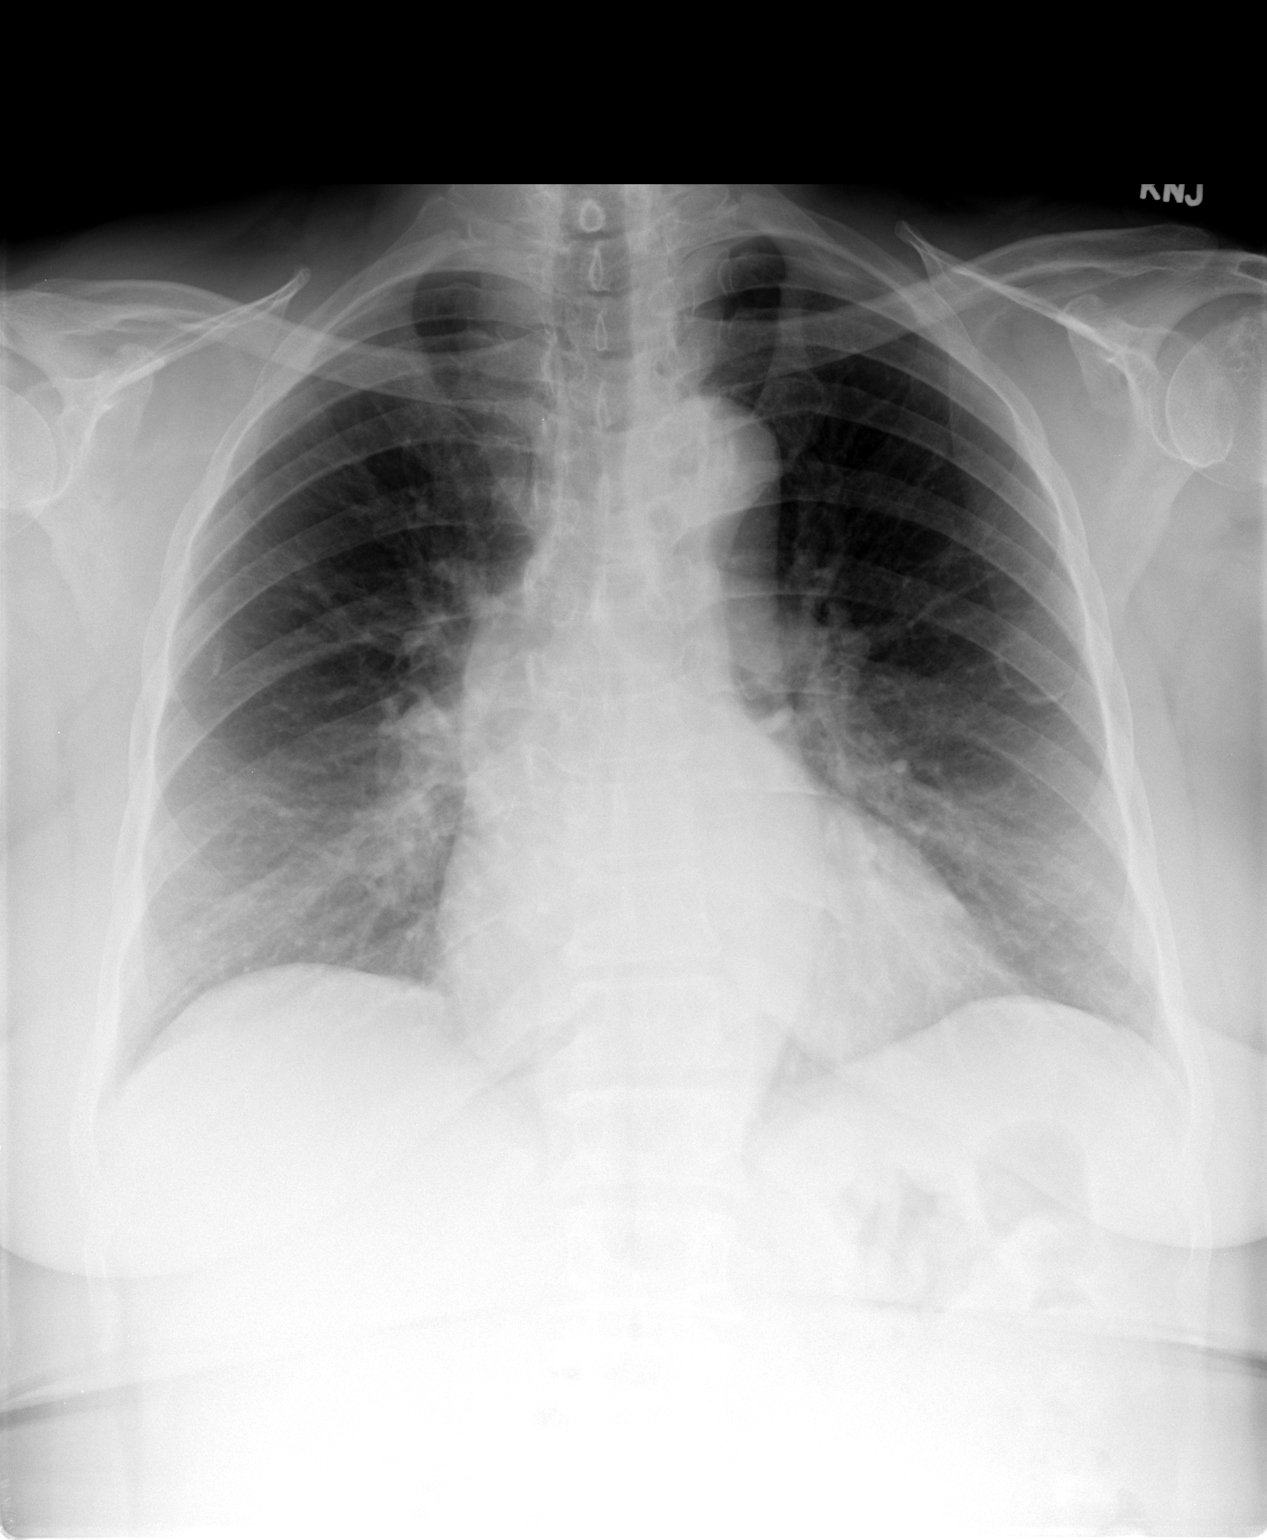

[view not recorded (2 of 2)]
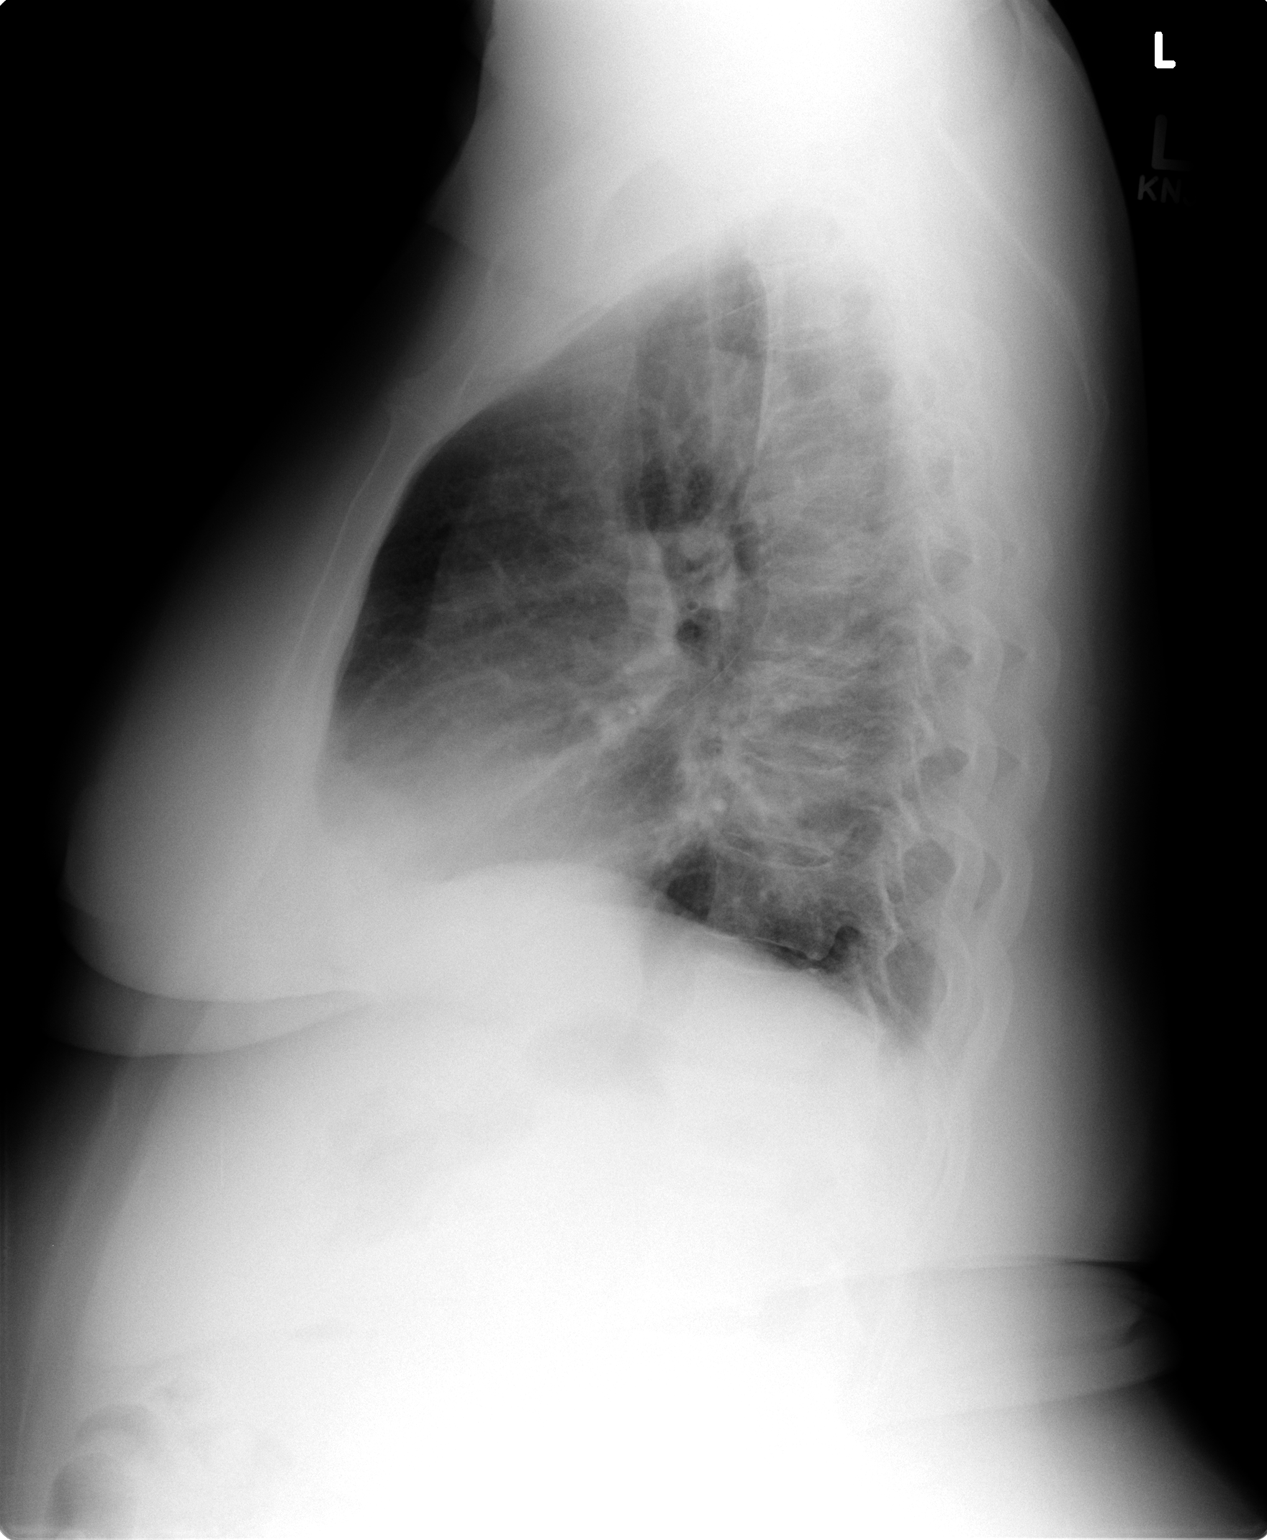

[2 of 2 positions shown; findings below may reference images not displayed]

FINDINGS: Trachea is midline.  Heart size stable.  Lungs are
somewhat low in volume with mild bibasilar atelectasis.  No pleural
fluid.
IMPRESSION: Mild bibasilar atelectasis.

## 2010-04-03 NOTE — Assessment & Plan Note (Signed)
Summary: 3 MTH FU-LB  #   Vital Signs:  Patient profile:   65 year old female Height:      65 inches (165.10 cm) Weight:      292.25 pounds (132.84 kg) BMI:     48.81 O2 Sat:      90 % on Room air Temp:     98.5 degrees F (36.94 degrees C) oral Pulse rate:   84 / minute Pulse rhythm:   regular BP sitting:   122 / 74  (left arm) Cuff size:   large  Vitals Entered By: Rebeca Alert CMA ([place]) (March 22, 2010 11:30 AM)  O2 Flow:  Room air CC: 3 month F/U/aj Is Patient Diabetic? Yes   Referring Provider:  Renato Shin, MD Primary Provider:  Jana Hakim, MD  CC:  3 month F/U/aj.  History of Present Illness: pt states she feels better in general no cbg record, but states cbg is low "every now and then."  she takes nph insulin as rx'ed.    Current Medications (verified): 1)  Advair Diskus 250-50 Mcg/dose Aepb (Fluticasone-Salmeterol) .... One Puff Two Times A Day 2)  Albuterol Sulfate (2.5 Mg/46ml) 0.083% Nebu (Albuterol Sulfate) .... One Vial Nebulized Up To Four Times Per Day As Needed 3)  Fluticasone Propionate 50 Mcg/act Susp (Fluticasone Propionate) .... 2 Sprays in Each Nostril Daily 4)  Caltrate 600+d 600-400 Mg-Unit  Tabs (Calcium Carbonate-Vitamin D) .... Take 1 By Mouth Once Daily 5)  Aspirin 81 Mg Tabs (Aspirin) .... Once Daily 6)  Cardizem Cd 240 Mg  Xr24h-Cap (Diltiazem Hcl Coated Beads) .... Once Daily 7)  Hydralazine Hcl 10 Mg  Tabs (Hydralazine Hcl) .... Three Times A Day 8)  Furosemide 80 Mg  Tabs (Furosemide) .... Take 1 By Mouth Two Times A Day 9)  Potassium Chloride Crys Cr 20 Meq  Tbcr (Potassium Chloride Crys Cr) .... 2 By Mouth Two Times A Day 10)  Zetia 10 Mg  Tabs (Ezetimibe) .... Take 1 By Mouth Once Daily 11)  Humulin N 100 Unit/ml Susp (Insulin Isophane Human) .... 55 Units Each Am, and 10 Units in The Evening, and Syringes Two Times A Day 12)  Celexa 20 Mg  Tabs (Citalopram Hydrobromide) .... Take 1 By Mouth Once Daily 13)  Tramadol Hcl 50 Mg  Tabs (Tramadol Hcl) .Marland Kitchen.. 1 Q4h As Needed Pain 14)  Lidoderm 5 %  Ptch (Lidocaine) .... Once Daily 15)  Omeprazole 40 Mg  Cpdr (Omeprazole) .Marland Kitchen.. 1 Each Day 30 Minutes Before Meal 16)  Metoclopramide Hcl 10 Mg  Tabs (Metoclopramide Hcl) .... Take 1 Tablet By Mouth 4 Times Daily. 49 Minutes Before [employer] and At Bedtime. 17)  Evista 60 Mg  Tabs (Raloxifene Hcl) .... Take 1 Once Daily 18)  Lidex 0.05 %  Crea (Fluocinonide) .... Three Times A Day As Needed Itching  Disp 1 Med Tube 19)  Bd Insulin Syringe Ultrafine 31g X 5/16" 0.3 Ml Misc (Insulin Syringe-Needle U-100) .... Use As Directed Once Daily 20)  Neurontin 300 Mg Caps (Gabapentin) .Marland Kitchen.. 1 By Mouth Two Times A Day 21)  Metoprolol Tartrate 50 Mg Tabs (Metoprolol Tartrate) .Marland Kitchen.. 1 By Mouth Two Times A Day 22)  Multivitamins   Tabs (Multiple Vitamin) .Marland Kitchen.. 1 By Mouth Daily 23)  Lipitor 40 Mg Tabs (Atorvastatin Calcium) .... One Daily 24)  Zafirlukast 20 Mg Tabs (Zafirlukast) .... One Two Times A Day  Allergies (verified): 1)  ! Phenergan  Past History:  Past Medical History: Last updated:  07/28/2009 Allergic rhinitis Asthma      - Spirometry 03/15/09 FEV1 1.35(61%), FVC 1.72(57%), FEV1% 78, +BD OSA      - PSG 05/08/09 AHI 11, RDI 21      - CPAP 9 cm H2O Depression Diabetes mellitus, type II since 1997 GERD Hypertension x years Hyperlipidemia x years Osteoarthritis Osteoporosis Hair Loss Arthritis Left CTS Non-Cardiogenic Morbid Obesity Mild Anemia w/u NEG Mild CAD  very minimal coronary disease with 20% obtuse   marginal stenosis. CVA on MRI Diverticulosis Esophageal Stricture Gastritis, unspecified Gastroparesis Adenomatous Colon Polyps Sickle cell trait  Review of Systems  The patient denies syncope.    Physical Exam  General:  morbidly obese.  no distress  Pulses:  dorsalis pedis intact bilat.  Extremities:  no deformity.  no ulcer on the feet.  feet are of normal color and temp.   trace right  pedal edema and trace left pedal edema.   there are 3 cm longitudonal surgical scars at the dorsal aspect of both feet. Neurologic:  sensation is intact to touch on the feet  Additional Exam:  Hemoglobin A1C       [H]  8.5 %   Impression & Recommendations:  Problem # 1:  DIABETES MELLITUS, TYPE II (ICD-250.00) therapy limited by noncompliance with cbg's.  i'll do the best i can.  Other Orders: TLB-A1C / Hgb A1C (Glycohemoglobin) (83036-A1C) Est. Patient Level III [id number])  Patient Instructions: 1)  check your blood sugar 2 times a day.  vary the time of day when you check, between before the 3 meals, and at bedtime.  also check if you have symptoms of your blood sugar being too high or too low.  please keep a record of the readings and bring it to your next appointment here.  please call us sooner if you are having low blood sugar episodes.  2)  blood tests are being ordered for you today.  please call [phone] to hear your test results. 3)  pending the test results, please continue nph insulin to 55 units each morning and 10 units each evening. 4)  Please schedule a follow-up appointment in 3 months. 5)  (update: i left message on phone-tree:  i need cbg info in order to safely adjust insulin).   Orders Added: 1)  TLB-A1C / Hgb A1C (Glycohemoglobin) [83036-A1C] 2)  Est. Patient Level III [id number]     Preventive Care Screening  Mammogram:    Date:  06/21/2009    Results:  done   Pap Smear:    Date:  06/21/2009    Results:  done

## 2010-04-03 NOTE — Medication Information (Signed)
Summary: Formulary/[employer]  Formulary/[employer]   Imported By: Bubba Hales 03/30/2010 08:23:18  _____________________________________________________________________  External Attachment:    Type:   Image     Comment:   External Document

## 2010-04-04 LAB — GLUCOSE, CAPILLARY
Glucose-Capillary: 191 mg/dL — ABNORMAL HIGH (ref 70–99)
Glucose-Capillary: 221 mg/dL — ABNORMAL HIGH (ref 70–99)
Glucose-Capillary: 244 mg/dL — ABNORMAL HIGH (ref 70–99)
Glucose-Capillary: 268 mg/dL — ABNORMAL HIGH (ref 70–99)
Glucose-Capillary: 309 mg/dL — ABNORMAL HIGH (ref 70–99)
Glucose-Capillary: 310 mg/dL — ABNORMAL HIGH (ref 70–99)
Glucose-Capillary: 338 mg/dL — ABNORMAL HIGH (ref 70–99)
Glucose-Capillary: 71 mg/dL (ref 70–99)

## 2010-04-04 LAB — BASIC METABOLIC PANEL
BUN: 27 mg/dL — ABNORMAL HIGH (ref 6–23)
CO2: 29 mEq/L (ref 19–32)
CO2: 32 mEq/L (ref 19–32)
Calcium: 8.2 mg/dL — ABNORMAL LOW (ref 8.4–10.5)
Calcium: 8.7 mg/dL (ref 8.4–10.5)
Chloride: 94 mEq/L — ABNORMAL LOW (ref 96–112)
Creatinine, Ser: 1.54 mg/dL — ABNORMAL HIGH (ref 0.4–1.2)
GFR calc Af Amer: 39 mL/min — ABNORMAL LOW (ref >60)
GFR calc Af Amer: 41 mL/min — ABNORMAL LOW (ref >60)
GFR calc Af Amer: 47 mL/min — ABNORMAL LOW (ref >60)
GFR calc non Af Amer: 34 mL/min — ABNORMAL LOW (ref >60)
GFR calc non Af Amer: 39 mL/min — ABNORMAL LOW (ref >60)
Glucose, Bld: 172 mg/dL — ABNORMAL HIGH (ref 70–99)
Glucose, Bld: 174 mg/dL — ABNORMAL HIGH (ref 70–99)
Potassium: 3.8 mEq/L (ref 3.5–5.1)
Potassium: 3.8 mEq/L (ref 3.5–5.1)
Sodium: 138 mEq/L (ref 135–145)
Sodium: 138 mEq/L (ref 135–145)

## 2010-04-04 LAB — DIFFERENTIAL
Basophils Relative: 0 % (ref 0–1)
Eosinophils Relative: 2 % (ref 0–5)
Lymphocytes Relative: 36 % (ref 12–46)
Monocytes Absolute: 0.6 10*3/uL (ref 0.1–1.0)
Neutro Abs: 4.2 10*3/uL (ref 1.7–7.7)
Neutrophils Relative %: 54 % (ref 43–77)

## 2010-04-04 LAB — CARDIAC PANEL(CRET KIN+CKTOT+MB+TROPI)
CK, MB: 1.1 ng/mL (ref 0.3–4.0)
CK, MB: 1.5 ng/mL (ref 0.3–4.0)
Relative Index: 0.9 (ref 0.0–2.5)
Total CK: 162 U/L (ref 7–177)

## 2010-04-04 LAB — HEPATIC FUNCTION PANEL
ALT: 17 U/L (ref 0–35)
Albumin: 2.9 g/dL — ABNORMAL LOW (ref 3.5–5.2)
Alkaline Phosphatase: 96 U/L (ref 39–117)
Total Bilirubin: 0.5 mg/dL (ref 0.3–1.2)
Total Protein: 7.6 g/dL (ref 6.0–8.3)

## 2010-04-04 LAB — CBC
HCT: 29.9 % — ABNORMAL LOW (ref 36.0–46.0)
Hemoglobin: 9.4 g/dL — ABNORMAL LOW (ref 12.0–15.0)
Hemoglobin: 9.7 g/dL — ABNORMAL LOW (ref 12.0–15.0)
MCH: 22.7 pg — ABNORMAL LOW (ref 26.0–34.0)
MCHC: 31.3 g/dL (ref 30.0–36.0)
MCHC: 31.4 g/dL (ref 30.0–36.0)
MCV: 71.3 fL — ABNORMAL LOW (ref 78.0–100.0)
Platelets: 265 10*3/uL (ref 150–400)
Platelets: 269 10*3/uL (ref 150–400)
RBC: 4.15 MIL/uL (ref 3.87–5.11)
RBC: 4.22 MIL/uL (ref 3.87–5.11)
RBC: 4.24 MIL/uL (ref 3.87–5.11)
RDW: 14.6 % (ref 11.5–15.5)
WBC: 7.8 10*3/uL (ref 4.0–10.5)

## 2010-04-04 LAB — URINE MICROSCOPIC-ADD ON

## 2010-04-04 LAB — URINALYSIS, ROUTINE W REFLEX MICROSCOPIC
Glucose, UA: NEGATIVE mg/dL
Leukocytes, UA: NEGATIVE
Specific Gravity, Urine: 1.013 (ref 1.005–1.030)
pH: 5 (ref 5.0–8.0)

## 2010-04-04 LAB — T3, FREE: T3, Free: 3.2 pg/mL (ref 2.3–4.2)

## 2010-04-04 LAB — URINE CULTURE

## 2010-04-04 LAB — BRAIN NATRIURETIC PEPTIDE: Pro B Natriuretic peptide (BNP): 34 pg/mL (ref 0.0–100.0)

## 2010-04-07 LAB — BASIC METABOLIC PANEL
BUN: 36 mg/dL — ABNORMAL HIGH (ref 6–23)
BUN: 40 mg/dL — ABNORMAL HIGH (ref 6–23)
Calcium: 8.3 mg/dL — ABNORMAL LOW (ref 8.4–10.5)
Calcium: 8.6 mg/dL (ref 8.4–10.5)
Chloride: 101 mEq/L (ref 96–112)
Creatinine, Ser: 2.27 mg/dL — ABNORMAL HIGH (ref 0.4–1.2)
Creatinine, Ser: 2.54 mg/dL — ABNORMAL HIGH (ref 0.4–1.2)
GFR calc non Af Amer: 22 mL/min — ABNORMAL LOW (ref >60)
Glucose, Bld: 60 mg/dL — ABNORMAL LOW (ref 70–99)
Potassium: 3.6 mEq/L (ref 3.5–5.1)

## 2010-04-07 LAB — CK TOTAL AND CKMB (NOT AT ARMC)
CK, MB: 1.5 ng/mL (ref 0.3–4.0)
Relative Index: INVALID (ref 0.0–2.5)
Relative Index: INVALID (ref 0.0–2.5)
Relative Index: INVALID (ref 0.0–2.5)
Total CK: 87 U/L (ref 7–177)
Total CK: 92 U/L (ref 7–177)

## 2010-04-07 LAB — GLUCOSE, CAPILLARY
Glucose-Capillary: 114 mg/dL — ABNORMAL HIGH (ref 70–99)
Glucose-Capillary: 124 mg/dL — ABNORMAL HIGH (ref 70–99)
Glucose-Capillary: 129 mg/dL — ABNORMAL HIGH (ref 70–99)
Glucose-Capillary: 132 mg/dL — ABNORMAL HIGH (ref 70–99)
Glucose-Capillary: 167 mg/dL — ABNORMAL HIGH (ref 70–99)
Glucose-Capillary: 179 mg/dL — ABNORMAL HIGH (ref 70–99)
Glucose-Capillary: 239 mg/dL — ABNORMAL HIGH (ref 70–99)
Glucose-Capillary: 258 mg/dL — ABNORMAL HIGH (ref 70–99)
Glucose-Capillary: 53 mg/dL — ABNORMAL LOW (ref 70–99)
Glucose-Capillary: 55 mg/dL — ABNORMAL LOW (ref 70–99)
Glucose-Capillary: 97 mg/dL (ref 70–99)

## 2010-04-07 LAB — CBC
Hemoglobin: 11.4 g/dL — ABNORMAL LOW (ref 12.0–15.0)
MCV: 75 fL — ABNORMAL LOW (ref 78.0–100.0)
Platelets: 223 10*3/uL (ref 150–400)
Platelets: 230 10*3/uL (ref 150–400)
RDW: 13.9 % (ref 11.5–15.5)
RDW: 14.2 % (ref 11.5–15.5)
WBC: 10 10*3/uL (ref 4.0–10.5)
WBC: 9.5 10*3/uL (ref 4.0–10.5)
WBC: 9.5 10*3/uL (ref 4.0–10.5)

## 2010-04-07 LAB — POCT I-STAT, CHEM 8
Calcium, Ion: 1.27 mmol/L (ref 1.12–1.32)
Chloride: 105 mEq/L (ref 96–112)
HCT: 38 % (ref 36.0–46.0)
TCO2: 31 mmol/L (ref 0–100)

## 2010-04-07 LAB — HEPATIC FUNCTION PANEL
ALT: 14 U/L (ref 0–35)
AST: 24 U/L (ref 0–37)
Albumin: 2.8 g/dL — ABNORMAL LOW (ref 3.5–5.2)
Alkaline Phosphatase: 91 U/L (ref 39–117)
Bilirubin, Direct: 0.2 mg/dL (ref 0.0–0.3)
Total Bilirubin: 0.5 mg/dL (ref 0.3–1.2)

## 2010-04-07 LAB — TSH: TSH: 4.727 u[IU]/mL — ABNORMAL HIGH (ref 0.350–4.500)

## 2010-04-07 LAB — DIFFERENTIAL
Basophils Absolute: 0 10*3/uL (ref 0.0–0.1)
Lymphocytes Relative: 26 % (ref 12–46)
Lymphs Abs: 2.5 10*3/uL (ref 0.7–4.0)
Neutro Abs: 5.8 10*3/uL (ref 1.7–7.7)
Neutrophils Relative %: 61 % (ref 43–77)

## 2010-04-07 LAB — URINALYSIS, ROUTINE W REFLEX MICROSCOPIC
Bilirubin Urine: NEGATIVE
Hgb urine dipstick: NEGATIVE
Nitrite: NEGATIVE
Specific Gravity, Urine: 1.017 (ref 1.005–1.030)
Urobilinogen, UA: 0.2 mg/dL (ref 0.0–1.0)
pH: 5 (ref 5.0–8.0)

## 2010-04-07 LAB — HEMOGLOBIN A1C
Hgb A1c MFr Bld: 13.4 % — ABNORMAL HIGH (ref 4.6–6.1)
Mean Plasma Glucose: 338 mg/dL

## 2010-04-07 LAB — RETICULOCYTES
RBC.: 4.82 MIL/uL (ref 3.87–5.11)
Retic Count, Absolute: 72.3 10*3/uL (ref 19.0–186.0)

## 2010-04-07 LAB — LIPASE, BLOOD: Lipase: 13 U/L (ref 11–59)

## 2010-04-07 LAB — TROPONIN I: Troponin I: 0.04 ng/mL (ref 0.00–0.06)

## 2010-04-07 LAB — POCT CARDIAC MARKERS
CKMB, poc: 4.6 ng/mL (ref 1.0–8.0)
Troponin i, poc: <0.05 ng/mL (ref 0.00–0.09)

## 2010-04-07 LAB — CREATININE, URINE, RANDOM: Creatinine, Urine: 107.6 mg/dL

## 2010-04-08 LAB — POCT I-STAT 3, ART BLOOD GAS (G3+)
Acid-Base Excess: 1 mmol/L (ref 0.0–2.0)
Bicarbonate: 26.6 mEq/L — ABNORMAL HIGH (ref 20.0–24.0)
TCO2: 28 mmol/L (ref 0–100)
pH, Arterial: 7.378 (ref 7.350–7.400)

## 2010-04-08 LAB — POCT I-STAT 3, VENOUS BLOOD GAS (G3P V)
Acid-base deficit: 1 mmol/L (ref 0.0–2.0)
O2 Saturation: 59 %
TCO2: 27 mmol/L (ref 0–100)
pCO2, Ven: 50.2 mmHg — ABNORMAL HIGH (ref 45.0–50.0)

## 2010-04-08 LAB — POCT I-STAT GLUCOSE: Operator id: 133881

## 2010-04-10 ENCOUNTER — Other Ambulatory Visit: Payer: Self-pay | Admitting: Endocrinology

## 2010-04-10 ENCOUNTER — Telehealth: Payer: Self-pay | Admitting: Pulmonary Disease

## 2010-04-10 DIAGNOSIS — E119 Type 2 diabetes mellitus without complications: Secondary | ICD-10-CM

## 2010-04-10 MED ORDER — INSULIN NPH (HUMAN) (ISOPHANE) 100 UNIT/ML ~~LOC~~ SUSP: SUBCUTANEOUS | Status: DC

## 2010-04-10 NOTE — Miscellaneous (Addendum)
Summary: CPAP download 02/20/10 to 03/21/10   Clinical Lists Changes Used on 30 of 30 nights with average 4hrs 14 min.  With CPAP at 9 cm H2O average AHI 11.  Will have my nurse inform patient that sleep apnea is improved with CPAP 9 cm, but numbers still a little high.  Will increase CPAP to 10 cm H2O and monitor clinical response. Orders: Added new Referral order of DME Referral (DME) - Signed  Appended Document: CPAP download 02/20/10 to 03/21/10 lmomtcb x1  Appended Document: CPAP download 02/20/10 to 03/21/10 see epic

## 2010-04-10 NOTE — Telephone Encounter (Signed)
Mindy called pt to inform her:   Clinical Lists Changes Used on 30 of 30 nights with average 4hrs 14 min.  With CPAP at 9 cm H2O average AHI 11.  Will have my nurse inform patient that sleep apnea is improved with CPAP 9 cm, but numbers still a little high.  Will increase CPAP to 10 cm H2O and monitor clinical response. Orders: Added new Referral order of DME Referral (DME) - Signed   Signed by [name] MD on 04/04/2010 at 5:48 PM   Called, spoke with pt.  She was informed of above statement per Dr. Halford Chessman.  She is aware order placed to have CPAP pressure increased to 10 cm H2O.  She verbalized understanding of these instructions.

## 2010-04-12 ENCOUNTER — Telehealth: Payer: Self-pay | Admitting: *Deleted

## 2010-04-12 NOTE — Telephone Encounter (Signed)
Transcription       Type  ID  Date and Time  Author    Clinical Lists Update  [id number]  04/04/2010 5:45 PM  [name], MD        Signed by [name], MD on 04/04/10 at 1748         Document Text    Summary: CPAP download 02/20/10 to 03/21/10  Clinical Lists Changes  Used on 30 of 30 nights with average 4hrs 14 min. With CPAP at 9 cm H2O average AHI 11.  Will have my nurse inform patient that sleep apnea is improved with CPAP 9 cm, but numbers still a little high. Will increase CPAP to 10 cm H2O and monitor clinical response.  Orders:  Added new Referral order of DME Referral (DME) - Signed      Called made pt aware of this and she verbalized understanding  Charma Igo, [place]

## 2010-04-17 ENCOUNTER — Encounter: Payer: Self-pay | Admitting: Endocrinology

## 2010-04-17 NOTE — Telephone Encounter (Signed)
A user error has taken place: encounter opened in error, closed for administrative reasons.  Rx sent 04/10/2010

## 2010-04-18 ENCOUNTER — Encounter: Payer: Self-pay | Admitting: Gastroenterology

## 2010-04-19 ENCOUNTER — Encounter: Payer: Self-pay | Admitting: Gastroenterology

## 2010-04-19 ENCOUNTER — Ambulatory Visit (AMBULATORY_SURGERY_CENTER): Payer: MEDICARE | Admitting: Gastroenterology

## 2010-04-19 ENCOUNTER — Other Ambulatory Visit: Payer: Self-pay | Admitting: *Deleted

## 2010-04-19 VITALS — BP 151/88 | HR 72 | Resp 15 | Ht 64.5 in | Wt 286.4 lb

## 2010-04-19 DIAGNOSIS — K2940 Chronic atrophic gastritis without bleeding: Secondary | ICD-10-CM

## 2010-04-19 DIAGNOSIS — R1310 Dysphagia, unspecified: Secondary | ICD-10-CM

## 2010-04-19 DIAGNOSIS — A048 Other specified bacterial intestinal infections: Secondary | ICD-10-CM

## 2010-04-19 DIAGNOSIS — K2900 Acute gastritis without bleeding: Secondary | ICD-10-CM

## 2010-04-19 DIAGNOSIS — K222 Esophageal obstruction: Secondary | ICD-10-CM

## 2010-04-19 LAB — GLUCOSE, CAPILLARY
Glucose-Capillary: 107 mg/dL — ABNORMAL HIGH (ref 70–99)
Glucose-Capillary: 111 mg/dL — ABNORMAL HIGH (ref 70–99)

## 2010-04-19 MED ORDER — FLUTICASONE-SALMETEROL 250-50 MCG/DOSE IN AEPB: 1.0000 | INHALATION_SPRAY | Freq: Two times a day (BID) | RESPIRATORY_TRACT | Status: DC

## 2010-04-19 NOTE — Patient Instructions (Addendum)
Review discharge instructions  Biopsy results will be mailed to you within 2 weeks.  Continue Omeprazole. Continue all regular medications as well.

## 2010-04-20 ENCOUNTER — Telehealth: Payer: Self-pay

## 2010-04-20 NOTE — Telephone Encounter (Signed)

## 2010-04-24 NOTE — Procedures (Signed)
Summary: Upper Endoscopy w/DIL  Patient: Joann Gutierrez Note: All result statuses are Final unless otherwise noted.  Tests: (1) Upper Endoscopy w/DIL (UED)  UED Upper Endoscopy w/DIL                             [facility].     [place], [place]  [address]          ENDOSCOPY PROCEDURE REPORT          PATIENT:  Joann, Gutierrez  MR#:  [id number]     BIRTHDATE:  1945-07-22, 64 yrs. old  GENDER:  female          ENDOSCOPIST:  Sandy Salaam. Deatra Ina, MD     ASSISTANT:     Referred by:          PROCEDURE DATE:  04/19/2010     PROCEDURE:  EGD with biopsy, Venia Minks Dilation of the Esophagus     ASA CLASS:  Class II     INDICATIONS:  1) dysphagia          MEDICATIONS:   Fentanyl 100 mcg IV, Versed 10 mg IV,     glycopyrrolate (Robinal) 0.2 mg IV, 0.6cc simethancone 0.6 cc PO     TOPICAL ANESTHETIC:  Exactacain Spray          DESCRIPTION OF PROCEDURE:   After the risks benefits and     alternatives of the procedure were thoroughly explained, informed     consent was obtained.  The [id number]-[id number] endoscope was     introduced through the mouth and advanced to the third portion of     the duodenum, without limitations.  The instrument was slowly     withdrawn as the mucosa was carefully examined.     <<PROCEDUREIMAGES>>          A stricture was found at the gastroesophageal junction (see     image9). Early esophageal stricture  Mild gastritis was found in     the fundus. Multiple areas of submucosal hemorrhage Multiple     biopsies were obtained and sent to pathology (see image6 and     image7).  The examination was otherwise normal (see image1,     image2, image3, image4, image5, image6, and image10).    Dilation     was then performed at the gastroesphageal junction          1) Dilator:  Venia Minks  Size(s):  18     Resistance:  minimal  Heme:  none     Appearance:          COMPLICATIONS:  None          ENDOSCOPIC IMPRESSION:  1) Stricture at the gastroesophageal junction     2) Mild gastritis in the fundus     3) Otherwise normal examination.     RECOMMENDATIONS:     1) await biopsy results     2) continue omeprazole          REPEAT EXAM:  No          ______________________________     Sandy Salaam. Deatra Ina, MD          CC:  Donavan Foil, MD          n.     Lorrin MaisSandy Salaam. Jiovany Scheffel at 04/19/2010 10:31 AM  Joann, Gutierrez, [id number]  Note: An exclamation mark (!) indicates a result that was not dispersed into the flowsheet. Document Creation Date: 04/19/2010 10:31 AM _______________________________________________________________________  (1) Order result status: Final Collection or observation date-time: 04/19/2010 10:26 Requested date-time:  Receipt date-time:  Reported date-time:  Referring Physician:   Ordering Physician: Erskine Emery [phone]) Specimen Source:  Source: Tawanna Cooler Order Number: [id number] Lab site:

## 2010-04-25 ENCOUNTER — Telehealth: Payer: Self-pay

## 2010-04-25 ENCOUNTER — Encounter: Payer: Self-pay | Admitting: Gastroenterology

## 2010-04-25 MED ORDER — AMOXICILL-CLARITHRO-LANSOPRAZ PO MISC: Freq: Two times a day (BID) | ORAL | Status: AC

## 2010-04-25 NOTE — Telephone Encounter (Signed)
Message copied by Algernon Huxley on Wed Apr 25, 2010 10:30 AM ------      Message from: Erskine Emery MD      Created: Wed Apr 25, 2010  9:25 AM       Pt needs Rx for h pylori.  Prescribe prevpak

## 2010-04-25 NOTE — Telephone Encounter (Signed)
Pt aware of clo biopsy report, prescription called in for pt.

## 2010-04-26 IMAGING — CT CT ANGIO CHEST
2 of 12 series · 16 of 37 positions shown · IV contrast (APPLIED)
Comparison: CT abdomen and pelvis 06/20/2005

CLINICAL DATA: Chest pain, upper abdominal pain and back pain.

CT ANGIOGRAPHY CHEST WITH CONTRAST
TECHNIQUE: Multidetector CT imaging of the chest was performed
using the standard protocol during bolus administration of
intravenous contrast.  Multiplanar CT image reconstructions
including MIPs were obtained to evaluate the vascular anatomy.
Contrast:  80 ml Omnipaque 300 IV.
TECHNIQUE: Multidetector CT imaging of the abdomen and pelvis was
performed using the standard protocol following bolus
administration of intravenous contrast.

[Series 8: pulm embolism 1.0 b25f thins · axial · 0.72mm/px · z∈[+1195,+1428]mm · 13 of 273 slices shown]
[im 20/273  lung]
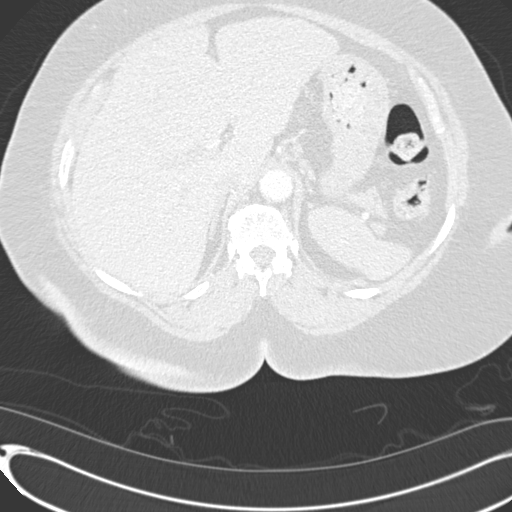
[im 39/273  mediastinal]
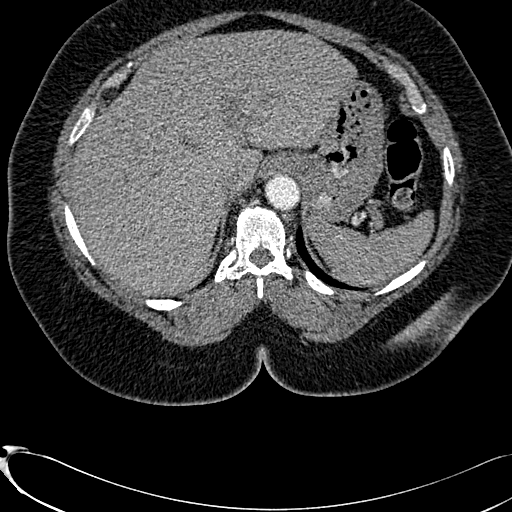
[im 59/273  lung]
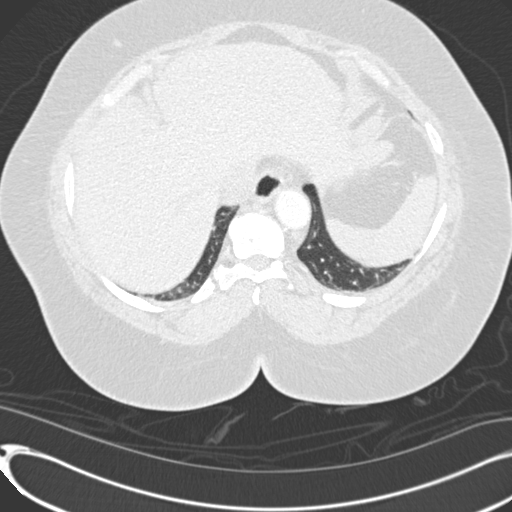
[im 78/273  mediastinal]
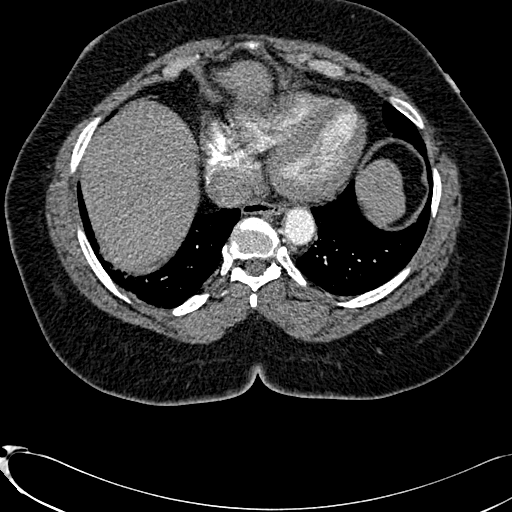
[im 98/273  lung]
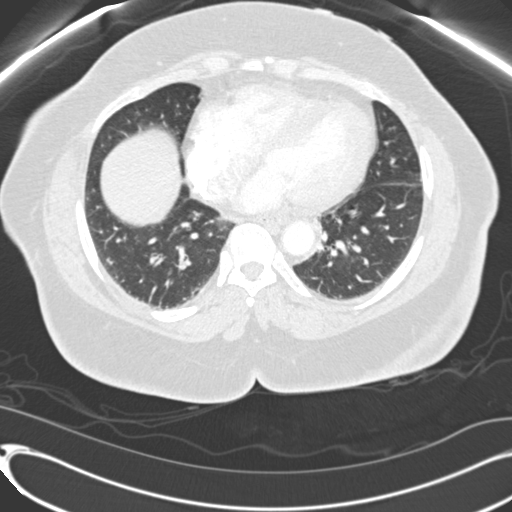
[im 117/273  mediastinal]
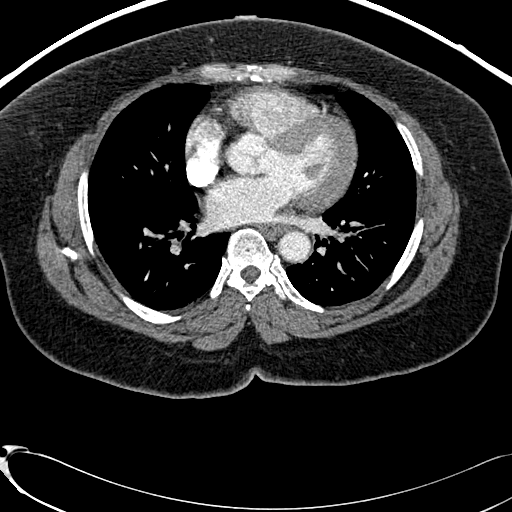
[im 137/273  lung]
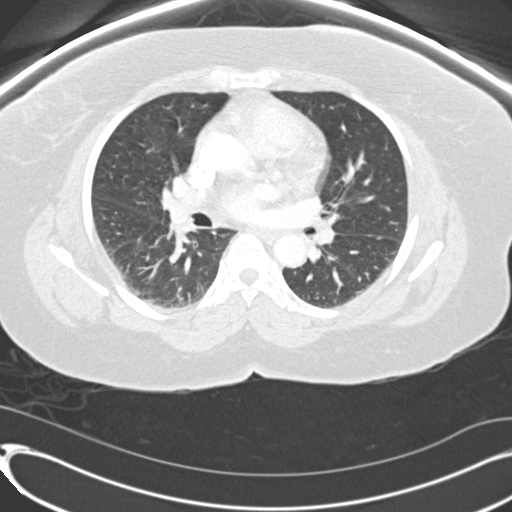
[im 156/273  mediastinal]
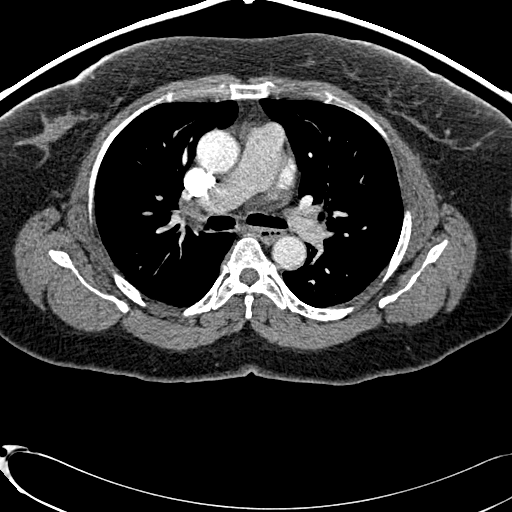
[im 175/273  lung]
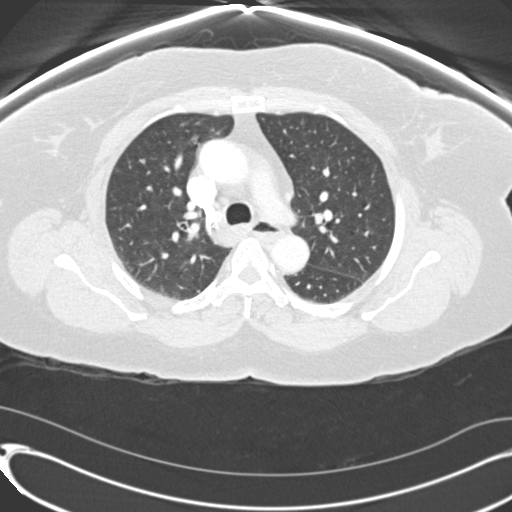
[im 195/273  mediastinal]
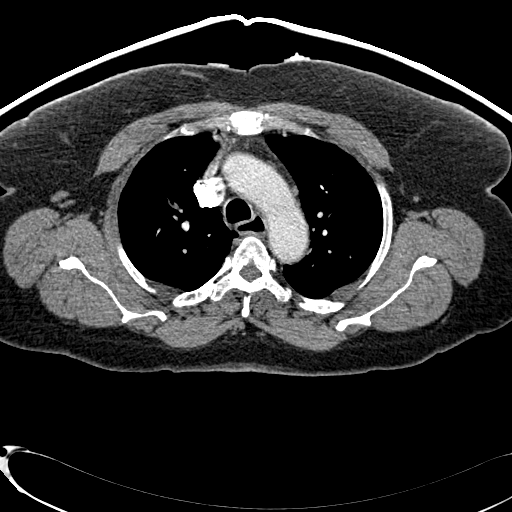
[im 214/273  lung]
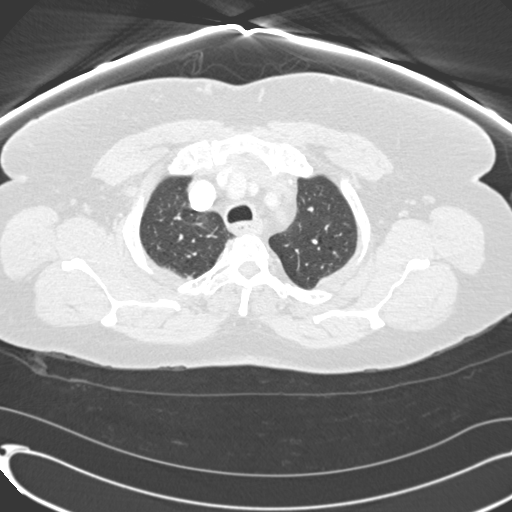
[im 234/273  mediastinal]
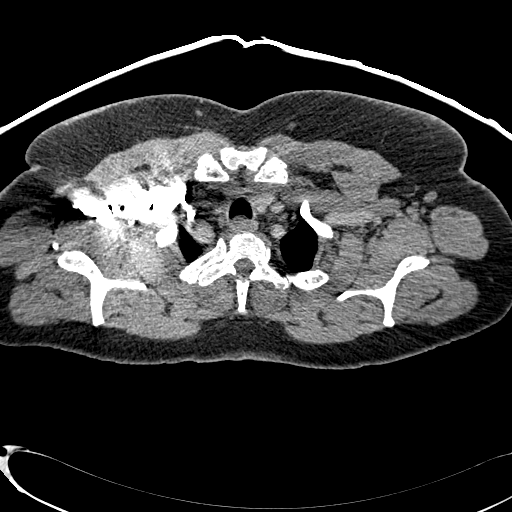
[im 253/273  lung]
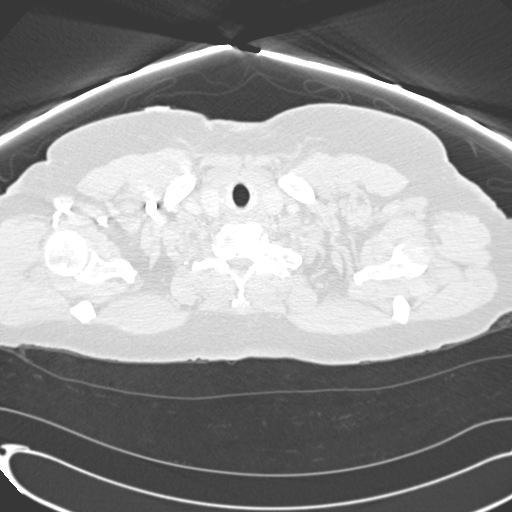

[Series 9: abd/pelv with 5.0 b31f st · axial · 0.84mm/px · z∈[+964,+1194]mm · 3 of 93 slices shown]
[im 24/93  lung]
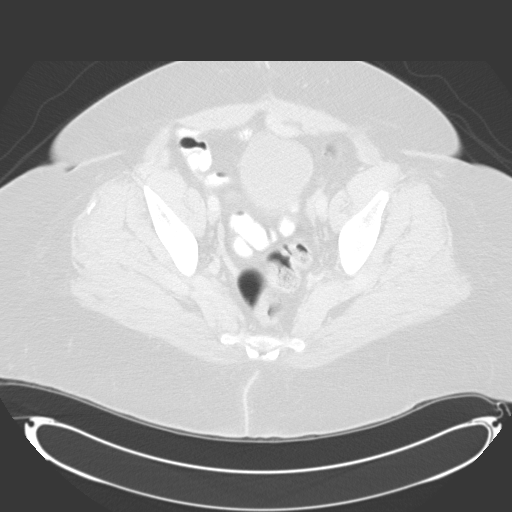
[im 47/93  lung]
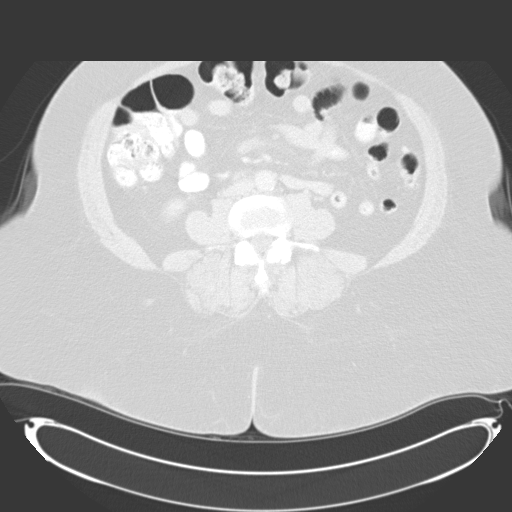
[im 70/93  lung]
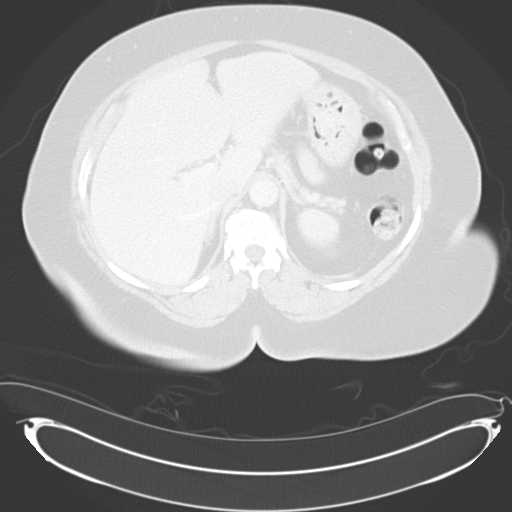

[16 of 37 positions shown; findings below may reference images not displayed]

FINDINGS: No filling defects in the pulmonary arteries to suggest
pulmonary emboli.  Heart is normal size.  Aorta is normal caliber.
No dissection. No mediastinal, hilar, or axillary adenopathy.

Minimal right base atelectasis.  No pleural effusions.  No acute
bony abnormality.

Review of the MIP images confirms the above findings.
IMPRESSION: No acute findings in the chest.  No evidence of pulmonary embolus.

 CT ABDOMEN AND PELVIS WITH CONTRAST
FINDINGS: Liver, gallbladder, spleen, pancreas, adrenals
unremarkable.  Small cysts in the kidneys bilaterally.  No
hydronephrosis.

Descending colonic diverticulosis noted.  No active diverticulitis.
Bladder, seminal vesicles and prostate unremarkable.  Ureters
decompressed.

No free fluid, free air or adenopathy.  Aorta is normal caliber.
No evidence of dissection.  Mild atherosclerotic disease in the
distal aorta and common iliac arteries.

No acute bony abnormality.  Degenerative changes at the lumbosacral
junction.  The
IMPRESSION: No acute findings in the abdomen or pelvis.

## 2010-04-26 IMAGING — CR DG CHEST 2V
2 series · 2 of 2 positions shown · non-contrast
Comparison: 01/02/2009

CLINICAL DATA: Chest and back pain

CHEST - 2 VIEW

[w chest pa]
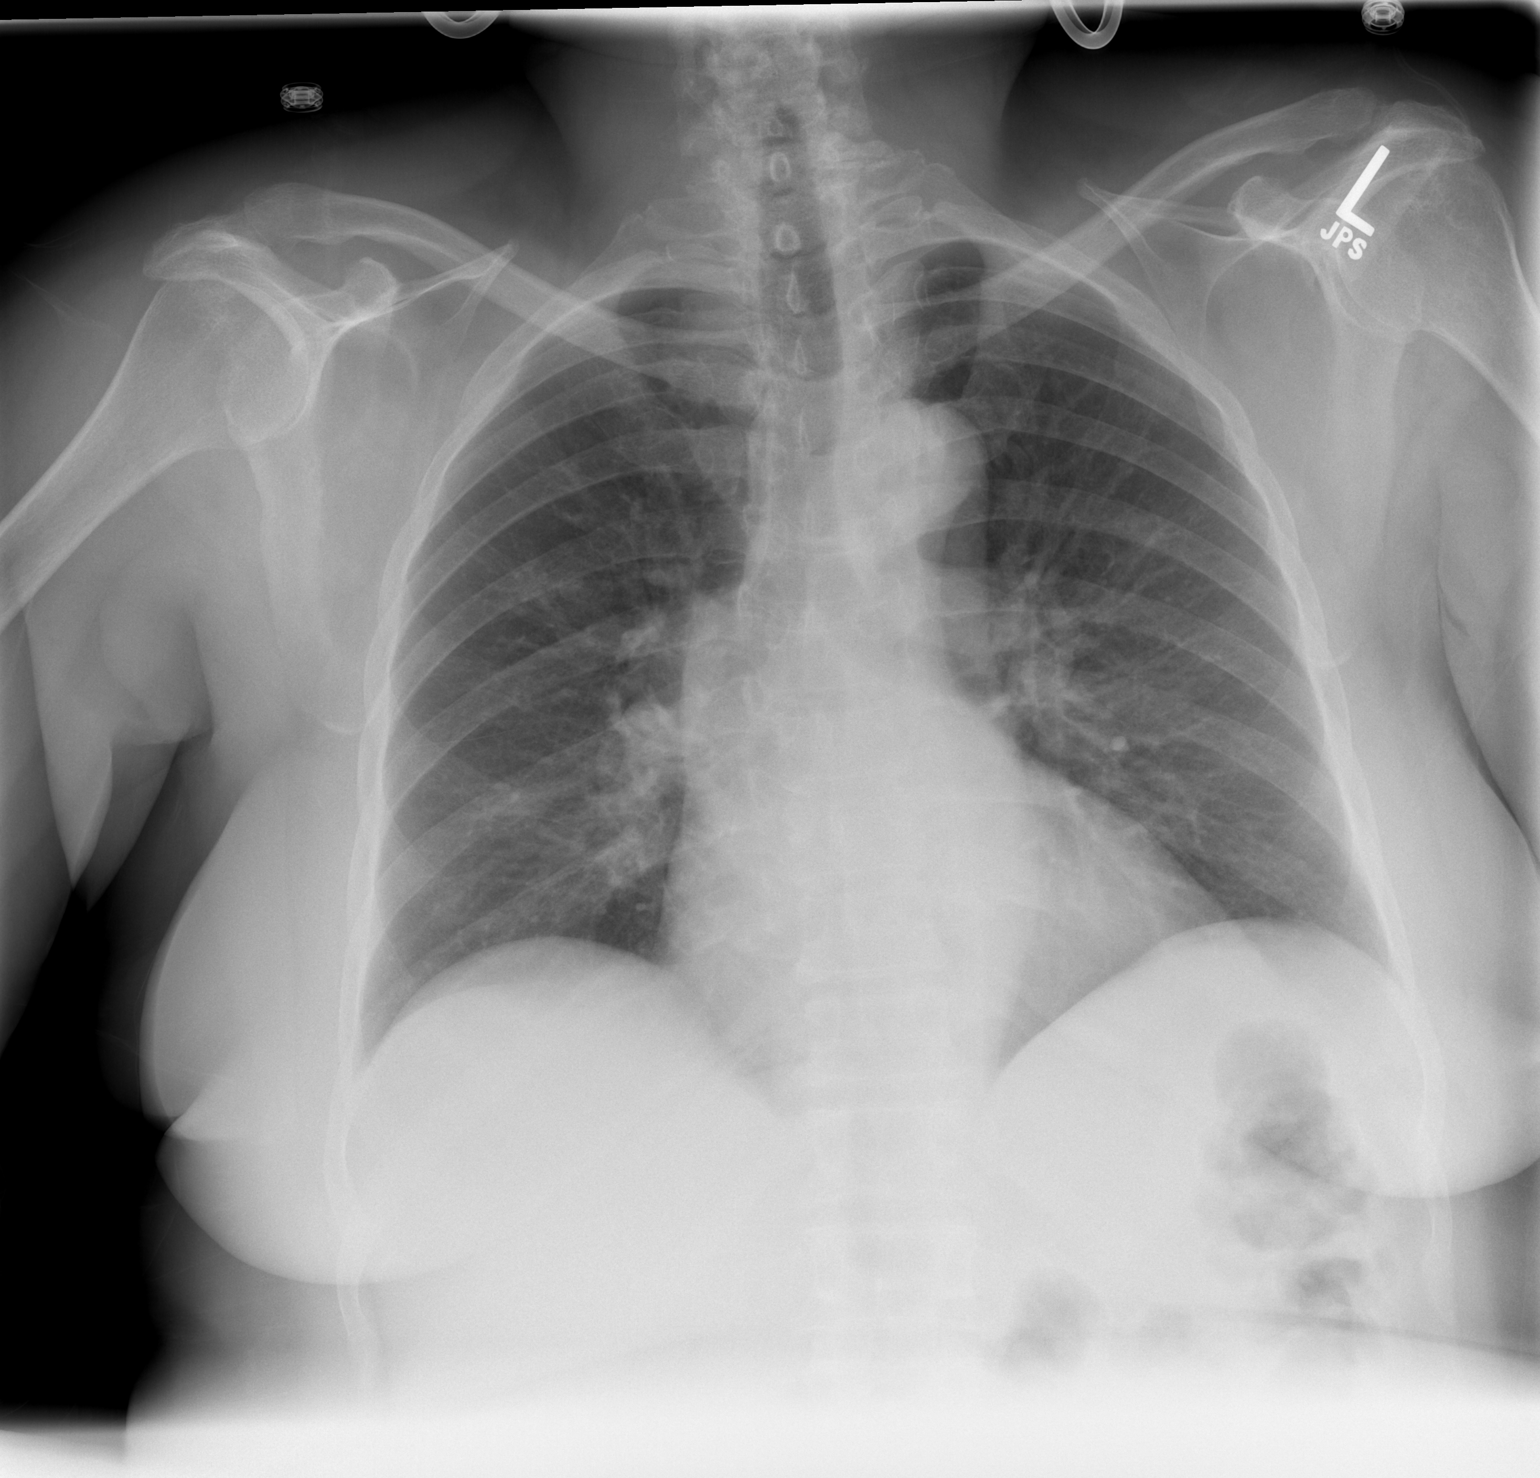

[w chest lat]
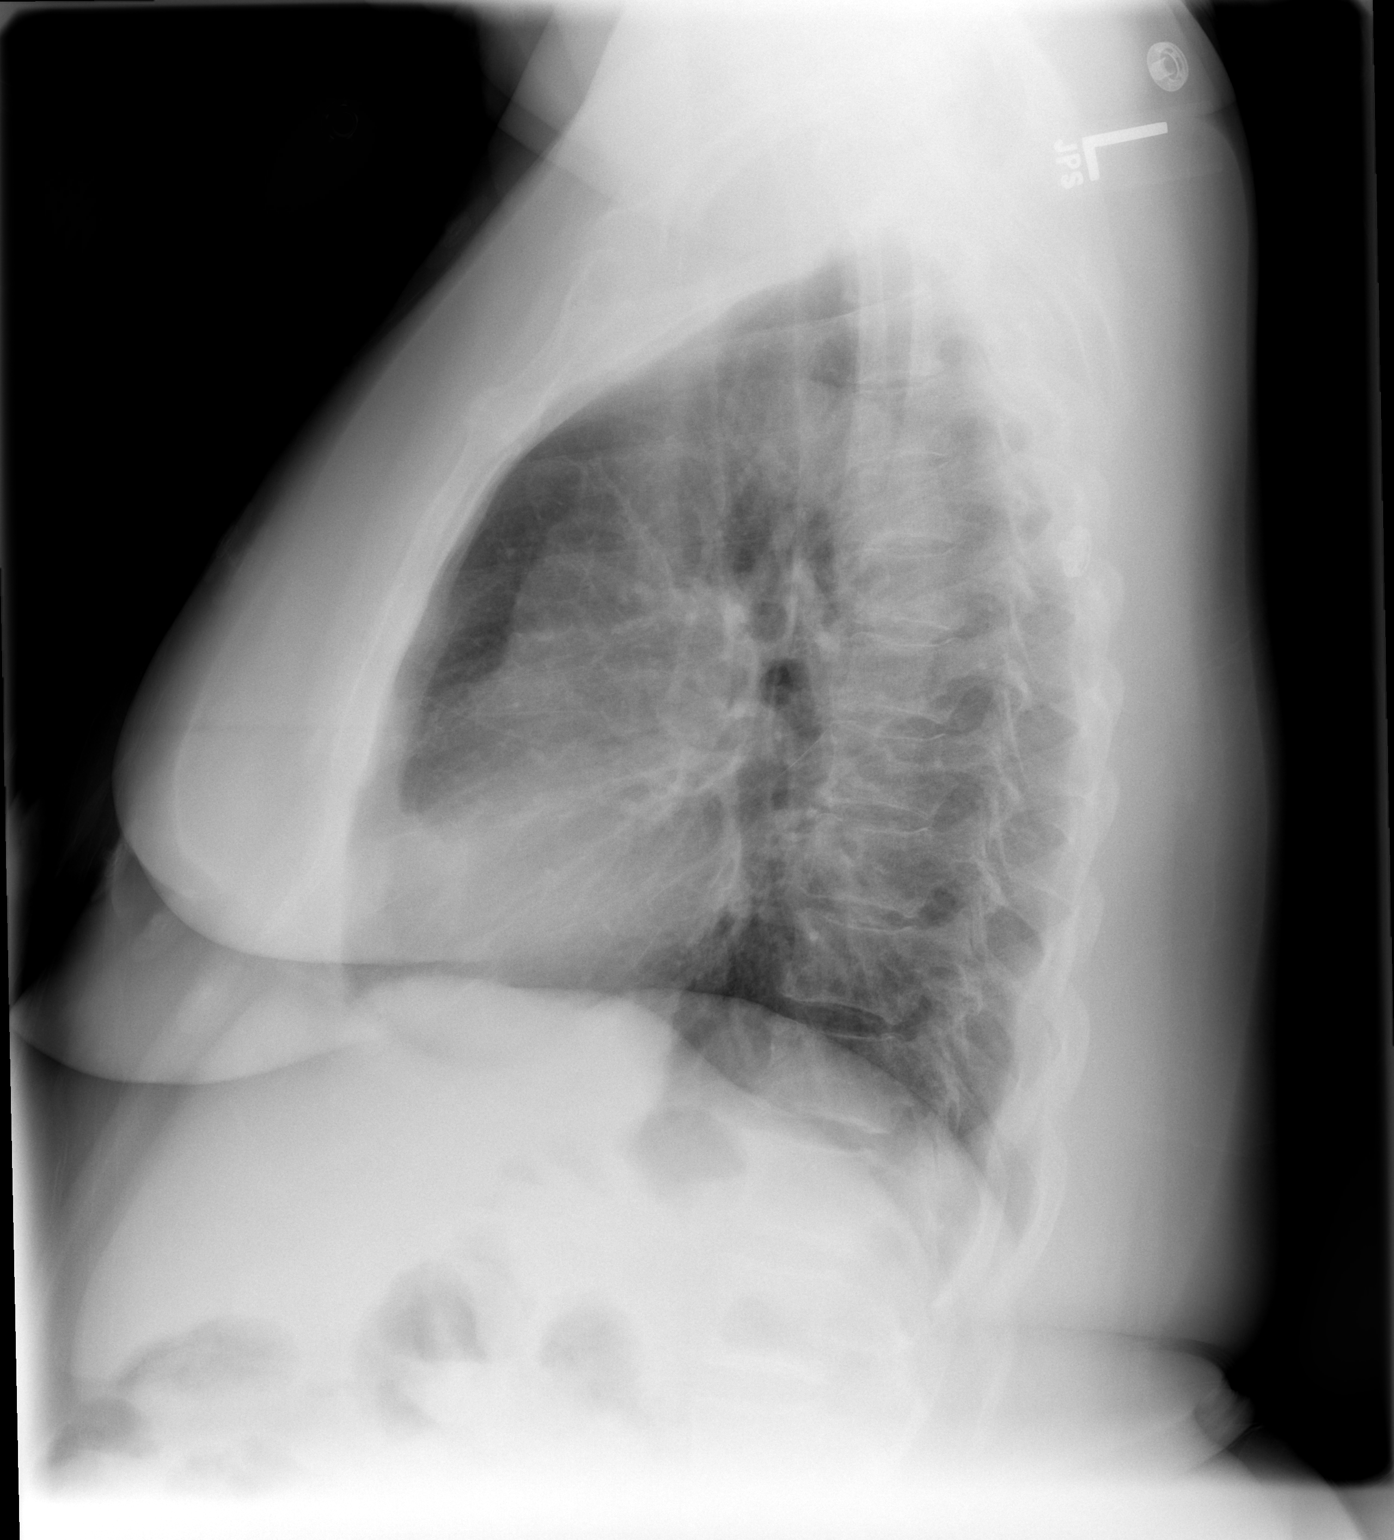

[2 of 2 positions shown; findings below may reference images not displayed]

FINDINGS: Heart size borderline enlarged.  No congestive heart
failure. Lungs clear.  No pleural fluid or osseous lesions.
IMPRESSION: Borderline cardiomegaly - no active disease.

## 2010-04-26 IMAGING — US US ABDOMEN COMPLETE
1 series · 14 of 25 positions shown · non-contrast
Comparison: None.

CLINICAL DATA: Abdominal pain.  Back pain.  Cholecystitis.

COMPLETE ABDOMINAL ULTRASOUND

[Series 1: us abdomen complete · 0.30mm/px · 14 of 82 slices shown]
[im 1/82]
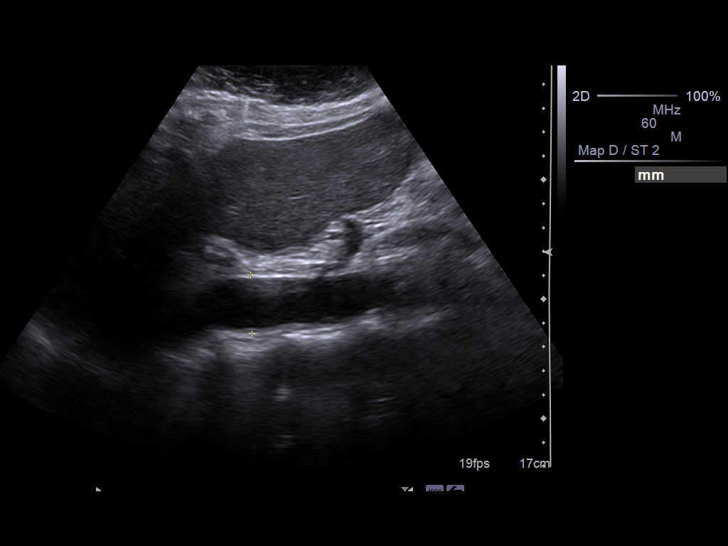
[im 7/82]
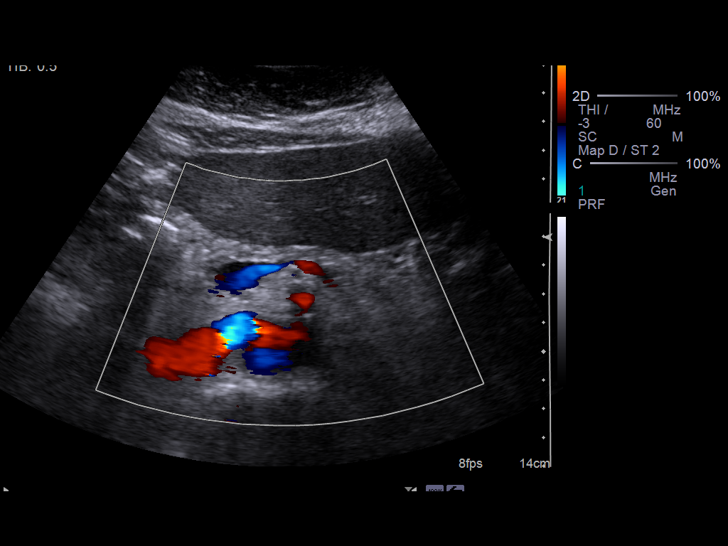
[im 14/82]
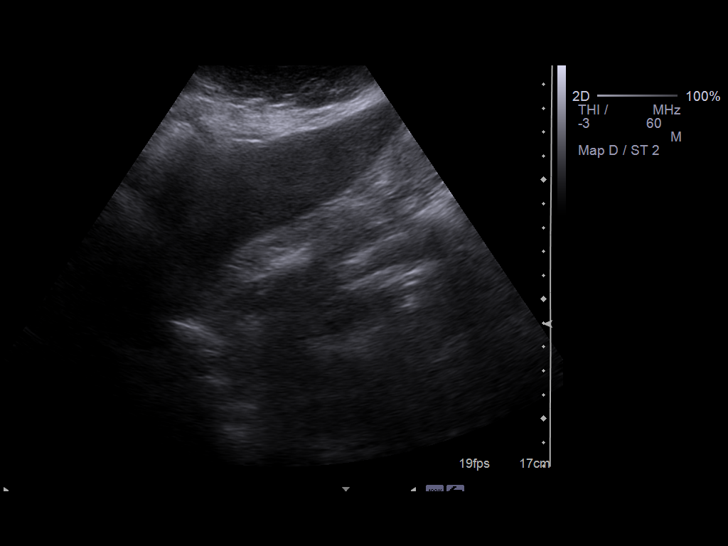
[im 21/82]
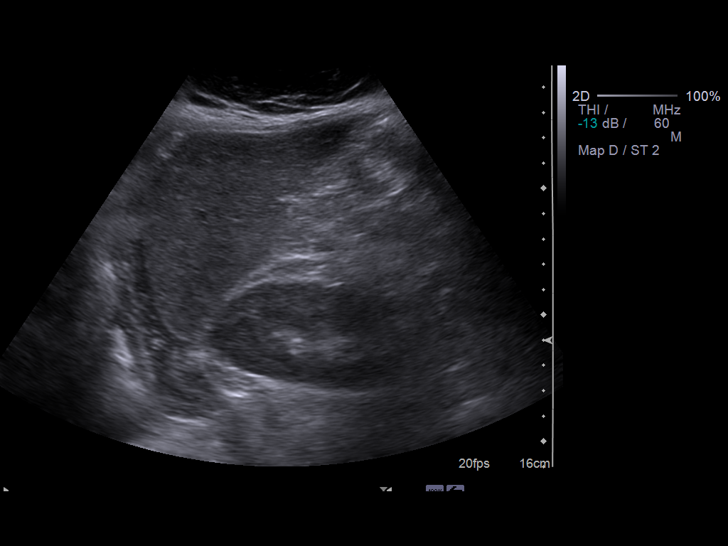
[im 28/82]
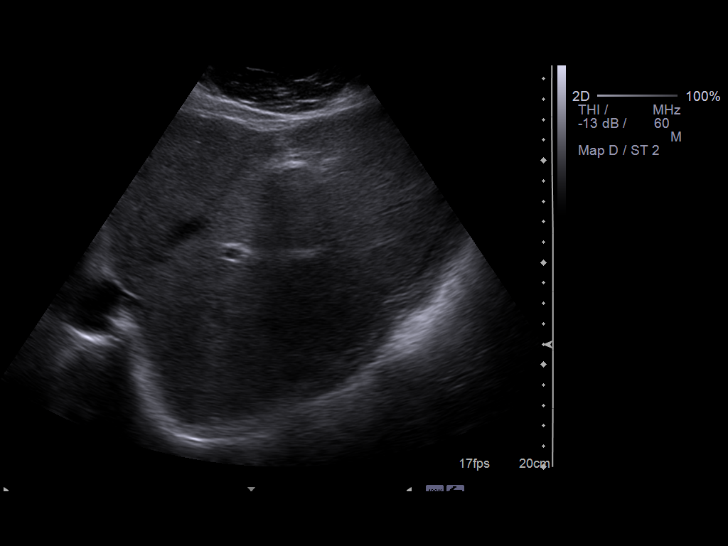
[im 31/82]
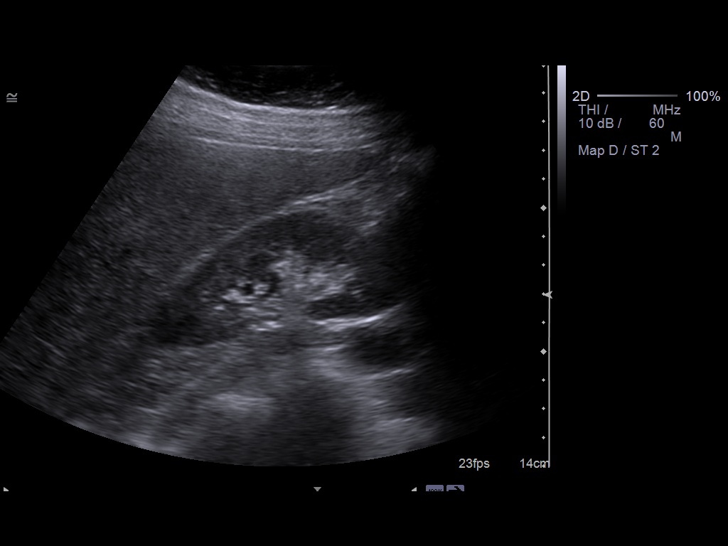
[im 38/82]
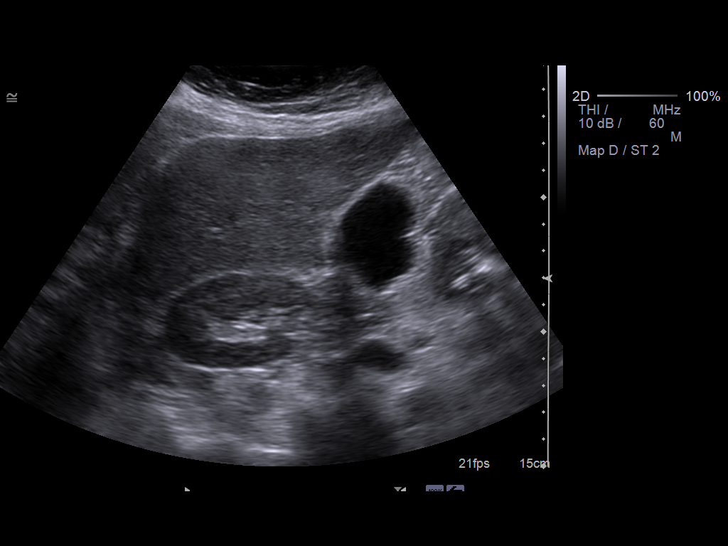
[im 44/82]
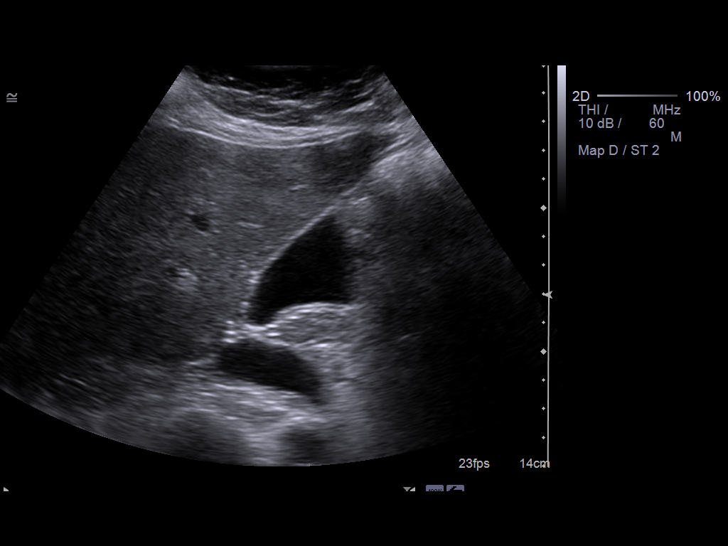
[im 51/82]
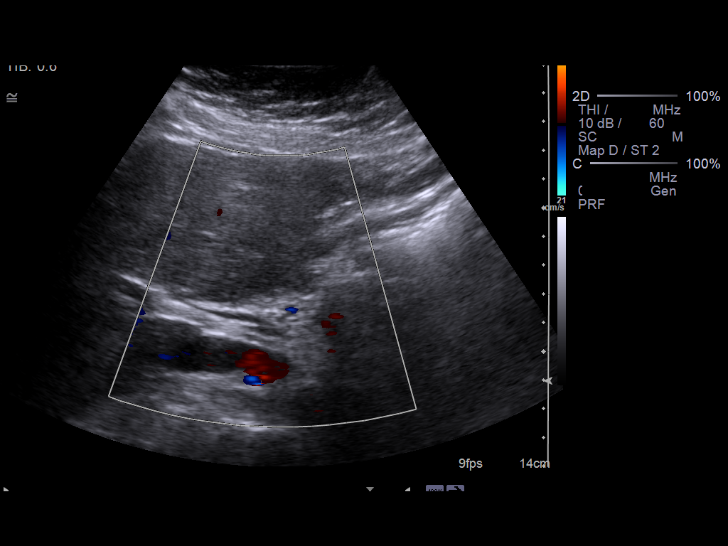
[im 55/82]
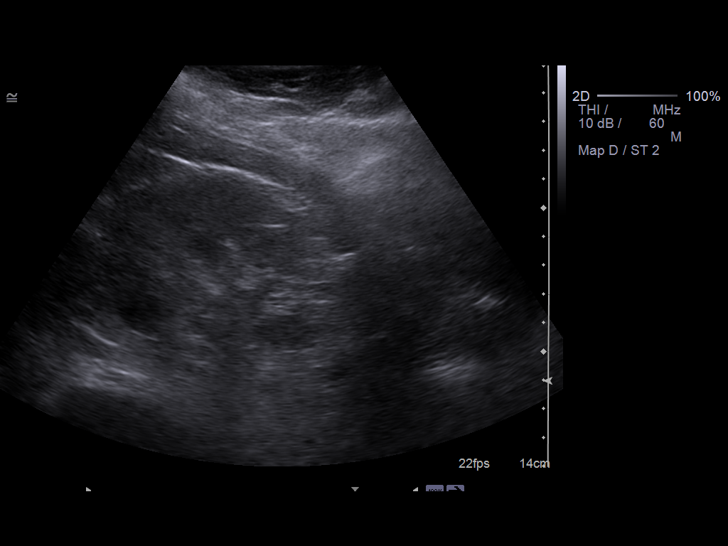
[im 61/82]
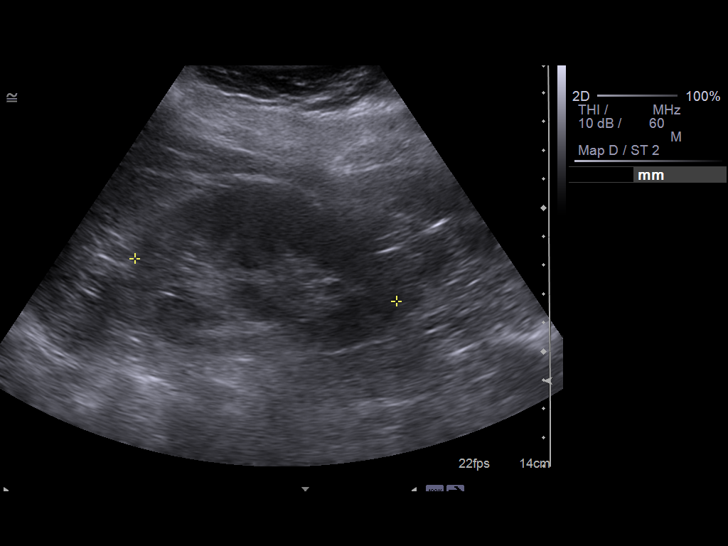
[im 68/82]
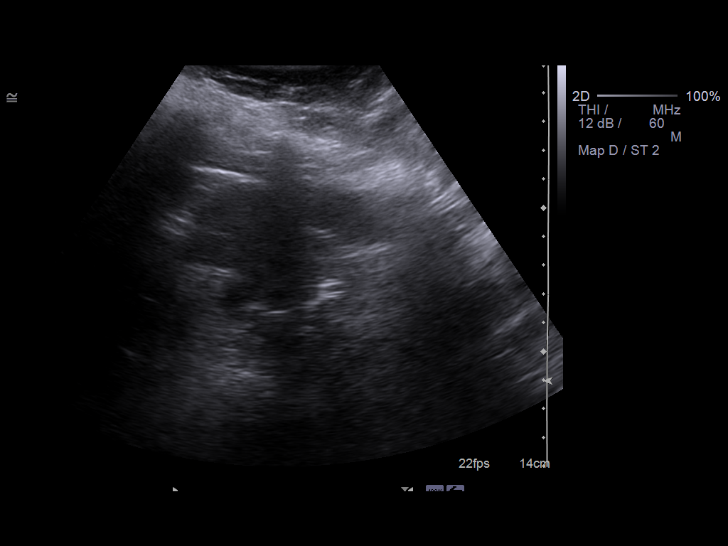
[im 75/82]
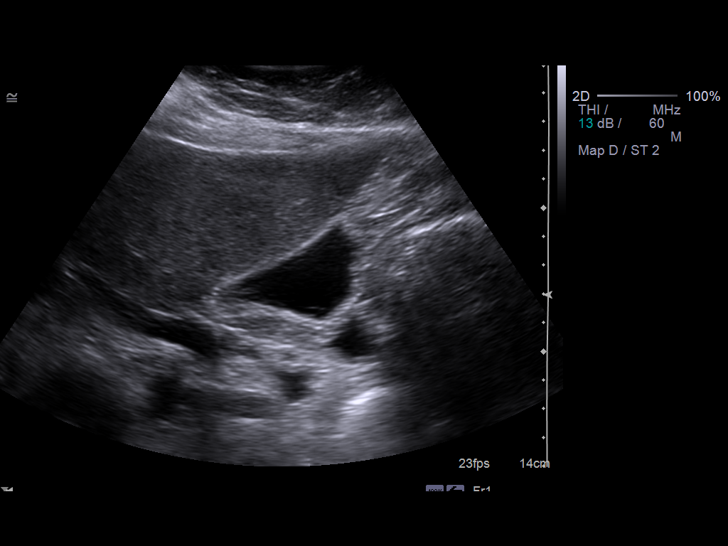
[im 82/82]
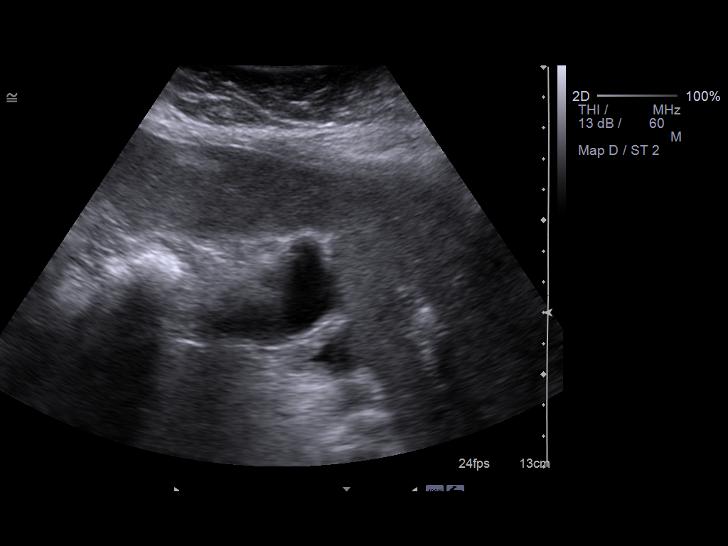

[14 of 25 positions shown; findings below may reference images not displayed]

FINDINGS: Gallbladder:  No gallstones, gallbladder wall thickening, or
pericholecystic fluid. No sonographic Murphy's sign.

Common bile duct:  4.4 mm, normal.

Liver:  19.1 cm liver span.  Normal echotexture.

IVC:  Appears normal.

Pancreas:  No focal abnormality seen.

Spleen:  4.7 cm, normal echotexture.

Right Kidney:  9.8 cm. Normal echotexture.  Normal central sinus
echo complex.  No calculi or hydronephrosis.

Left Kidney:  9.2 cm. Upper pole simple cyst measuring 2.7 cm.

Abdominal aorta:  2.5 cm.
IMPRESSION: 1.  No evidence of cholecystitis or cholelithiasis.
2.  Mild abdominal aortic ectasia.
3.  No acute abdominal abnormality.

## 2010-04-30 LAB — GLUCOSE, CAPILLARY: Glucose-Capillary: 215 mg/dL — ABNORMAL HIGH (ref 70–99)

## 2010-05-01 LAB — GLUCOSE, CAPILLARY
Glucose-Capillary: 188 mg/dL — ABNORMAL HIGH (ref 70–99)
Glucose-Capillary: 237 mg/dL — ABNORMAL HIGH (ref 70–99)

## 2010-05-16 ENCOUNTER — Encounter: Payer: Self-pay | Admitting: Pulmonary Disease

## 2010-05-21 ENCOUNTER — Encounter: Payer: Self-pay | Admitting: Pulmonary Disease

## 2010-06-05 NOTE — Assessment & Plan Note (Signed)
[facility] OFFICE NOTE   Joann Gutierrez, Joann Gutierrez                  MRN:          [id number]  DATE:04/22/2007                            DOB:          1945/10/04    PROBLEM:  Dysphagia.   Joann Gutierrez has returned, again complaining of dysphagia.  She claims  to have difficulty swallowing liquids and solids.  She has occasional  odynophagia.  She is unsure of her medicines.  She did take antibiotics  several months ago.  She has a history of diabetes.  She underwent upper  endoscopy with dilatation to 18 mm in June 2008.  She reports that her  symptoms of dysphagia were improved following the dilatation.   EXAM:  Pulse 86, blood pressure 146/84, weight 260.   IMPRESSION:  1. Dysphagia.  Of concern is the recurrent stricture.  Motility      disorder is another consideration.  Candida esophagitis is less      likely.  Motility disorder is also a consideration.  2. Diabetes.  3. History of gastroparesis.   RECOMMENDATIONS:  Barium swallow.   I will reassess her following this exam.     Sandy Salaam. Deatra Ina, MD,FACG  Electronically Signed    RDK/MedQ  DD: 04/22/2007  DT: 04/22/2007  Job #: [id number]

## 2010-06-05 NOTE — Assessment & Plan Note (Signed)
[facility] OFFICE NOTE   KEANNAH, NORTHROP                  MRN:          [id number]  DATE:06/13/2006                            DOB:          1945-02-08    PROBLEMS:  1. Dysphagia.  2. Nausea.   Ms. Affinito has returned for re-evaluation.  She has recurrent  dysphagia to solids and to a lesser extent, liquids.  She is also  complaining of constant nausea.  She has a history of an esophageal  stricture and was last dilated in February, 2007.  Retained gastric  contents was also noted at that time.  She complains of chronic nausea.  She has a history of diabetes.   Medications include Caltrate, minoxidil, simvastatin, Norvasc, Benicar,  Cardizem, Celexa, Humalog, Advair, Prilosec, Zetia, Lasix, Diovan,  Evista, potassium, Metformin, and Lantus.   She is allergic to [facility].   PHYSICAL EXAMINATION:  VITAL SIGNS:  Pulse 80, blood pressure 152/74.  Weight 256.  HEENT: EOMI. PERRLA. Sclerae are anicteric.  Conjunctivae are pink.  NECK:  Supple without thyromegaly, adenopathy or carotid bruits.  CHEST:  Clear to auscultation and percussion without adventitious  sounds.  CARDIAC:  Regular rhythm; normal S1 S2.  There are no murmurs, gallops  or rubs.  ABDOMEN:  There is no succussion splash.  Bowel sounds are normoactive.  Abdomen is soft, non-tender and non-distended.  There are no abdominal  masses, tenderness, splenic enlargement or hepatomegaly.  EXTREMITIES:  Full range of motion.  No cyanosis, clubbing or edema.  RECTAL:  Deferred.   IMPRESSION:  1. Recurrent symptomatic esophageal stricture.  2. Nausea, very likely secondary to gastroparesis.   RECOMMENDATION:  1. Repeat dilatation.  2. Increase Reglan from 10 mg daily to 10 mg one-half hour a.c. and      h.s.     Sandy Salaam. Deatra Ina, MD,FACG  Electronically Signed    RDK/MedQ  DD: 06/13/2006  DT: 06/13/2006  Job #: [id number]

## 2010-06-06 ENCOUNTER — Other Ambulatory Visit: Payer: Self-pay | Admitting: Endocrinology

## 2010-06-06 MED ORDER — INSULIN NPH (HUMAN) (ISOPHANE) 100 UNIT/ML ~~LOC~~ SUSP: SUBCUTANEOUS | Status: DC

## 2010-06-07 ENCOUNTER — Telehealth: Payer: Self-pay | Admitting: *Deleted

## 2010-06-07 MED ORDER — BUDESONIDE-FORMOTEROL FUMARATE 160-4.5 MCG/ACT IN AERO: 2.0000 | INHALATION_SPRAY | Freq: Two times a day (BID) | RESPIRATORY_TRACT | Status: DC

## 2010-06-07 NOTE — Telephone Encounter (Signed)
Form received from [facility] requesting rx for Advair be changed to Symbicort due to insurance coverage. Pt now has prescription coverage through [facility] Part D. Pls advise.

## 2010-06-07 NOTE — Telephone Encounter (Signed)
lmomtcb x1 to advise pt of the change in medication. rx was sent to pharmacy

## 2010-06-07 NOTE — Telephone Encounter (Signed)
Okay to change to symbicort 160/4.5 two puffs bid.  Dispense one with 6 refills.

## 2010-06-08 NOTE — Group Therapy Note (Signed)
MEDICAL RECORD NUMBER:  [id number]   Joann Gutierrez is a 65 year old female who is referred for evaluation by Dr.  Renato Shin with pain complaints that center about her low back as well as  bilateral knees but also include bilateral hands.  She has had a left total  knee replacement in 1998 or 1999, per her report.  There is no transcription  E chart regards to this.  She does have a left total knee scar that appears  old.  She states that she has had carpal tunnel syndrome diagnosed several  years ago and had an EMG although she cannot tell me where.  She has been  treated for low back pain in the past and has taken Aleve p.r.n. for this.  She averages approximately 4 tablets per day.  She states that she has been  diagnosed with shoulder spurs.  Looking through E chart I do not see any x-  rays of her shoulders.   She has had physical therapy as recently as 2 months ago at [facility]  outpatient clinic on [address] and states that it helped somewhat for  her back pain.  The last lumbar spine that I see is dated March 11, 2002  showing degenerative disk disease at L5-S1 as well as T11-12.   PAST MEDICAL HISTORY:  1. Significant for hyperlipidemia.  2. Esophageal stricture.  3. Diabetes.  4. Hypertension.  5. Headaches.  6. Reflux.   PAST SURGICAL HISTORY:  As noted above, total knee replacement on the left  side.  She has also had a hysterectomy.  She has had left heart catheterization on July 10, 2001 which showed LAD 20%  stenosis proximally.  Dominant RCA but otherwise unremarkable.   SOCIAL HISTORY:  She lives with her granddaughter who is going back to  college.  She is widowed. 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 Her home has no steps to enter, no steps  inside the house.   REVIEW OF SYSTEMS:  Recent URI with shortness of breath, cold, wheezing,  coughing, chest pain.  She notes weakness, numbness mainly in her legs.  Dizziness, spasms, blurred vision, anxiety, depression, poor sleep.  Hearing, taste loss, headaches, reflux, bladder incontinence, weight gain.  High blood sugars, sweating.  Her pain diagram is marked throughout her  body.   PHYSICAL EXAMINATION:  VITAL SIGNS:  Blood pressure 145/79, pulse 84,  respiratory rate is 20, O2 saturation 99% on room air.   Her gait is slow, she arises stiffly from the chair but is able to ambulate  without evidence of toe drag or knee instability.  No assistance device  needed.   Motor exam:  She has give away weakness and is difficult to test in all 4  extremities, does have antigravity + strength so I would grade her at least  4- or 5 bilateral deltoid, biceps, triceps grip as well as hip flexion, knee  extension, ankle dorsiflexion.  Sensation is intact bilateral in upper and  lower extremities to pin prick, she has normal deep tendon reflexes in  bilateral upper and lower extremities.  She has decreased shoulder internal  rotation but otherwise has full range of motion bilaterally, and lower  extremities.  She has no pain on palpation bilateral upper and lower  extremities.   IMPRESSION:  1. Chronic low back pain, lumbar degenerative disk.  2. Left knee osteoarthritis status post total knee replacement.  3.  She states she has right knee pain with arthritis in that joint but is     holding off on another knee replacement.  4. The patient complains of bilateral wrist pain, states she has been told     she has carpal tunnel (I do not have any documentation of this).  5. Shoulder range of motion restriction, mainly internal rotation.  I do not     have any other diagnostic testing on this.  It could be rotator cuff     syndrome.   PLAN:  1. In terms of treatment I think she has done relatively well.  In terms of     her functional status she remains independent.  I do not think she needs     any formal physical therapy at this time.   1. Give her a  trial of Lidoderm patch to the right knee.  This may hold her     off for the knee replacement although certainly this procedure would be     helpful in alleviating some of her mobility problems.   1. In regards to her question of disability, she has no obvious disabling     diagnosis.  In addition I do not have appropriate records to evaluate     some of her complaints. She also does not cooperate well with manual     muscle testing and I suspect it is highlighting her level of weakness.   Given the above, I will ask one of my colleagues to do a more formalized  disability evaluation and hope that this can be of further assistance.  I do  not plan to follow this patient but certainly Dr. Loanne Drilling can continue the  Lidoderm patch if this proves to be helpful for her knee pain and one can be  used for her back pain as well.     Charlett Blake, M.D.   AEK/MedQ  D:  07/14/2003 17:28:16  T:  07/15/2003 06:19:00  Job #:  [id number]   cc:   Hilliard Clark A. Loanne Drilling, M.D. [facility]   Jarvis Morgan, M.D.  [name] [place]  [place] [address]  Fax: [phone]

## 2010-06-08 NOTE — Telephone Encounter (Signed)
Pt returned call. Cooper Render

## 2010-06-08 NOTE — Cardiovascular Report (Signed)
NAME:  Joann Gutierrez, Joann Gutierrez                     ACCOUNT NO.:  [id number]   MEDICAL RECORD NO.:  [id number]                   PATIENT TYPE:  OIB   LOCATION:  6501                                 FACILITY:  [facility]   PHYSICIAN:  Jenkins Rouge, M.D.                  DATE OF BIRTH:  [birth year]   DATE OF PROCEDURE:  07/28/2003  DATE OF DISCHARGE:                              CARDIAC CATHETERIZATION   PROCEDURE:  Cardiac catheterization.   CARDIOLOGIST:  Jenkins Rouge, M.D.   INDICATIONS:  Chest pain.  Abnormal Cardiolite study.   DESCRIPTION OF PROCEDURE:  Cine catheterization is done from the right  femoral artery.  The patient had somewhat unusual hip anatomy.  The right  femoral head was fluoroscopied.  We used this as a guide for catheter  placement which was approximately 3 cm above the inguinal crease.   Standard JR4 and JL4 catheters were used.   RESULTS:  CORONARY ARTERIES:  1. The left main coronary artery was normal.  2. The left anterior descending artery was normal.  3. The circumflex coronary artery was normal.  The first obtuse marginal had     a 30% ostial lesion.  4. The right coronary artery was dominant and normal.  5. The patient had somewhat tortuous coronaries consistent with her high     blood pressure.   RAO VENTRICULOGRAPHY:  The RAO ventriculography was normal.  Ejection  fraction was in the 70% range.  There was no gradient across on the aortic  valve and no mitral regurgitation.  The aortic root seemed of normal caliber  as seen by the RAO ventriculogram.   PRESSURES:  1. The LV pressure was 135/18.  2. Aortic pressure was 135/96.   IMPRESSION:  The patient's chest pain would appear to be noncardiac in  etiology.  She has had normal heart catheterization in 2000 and essentially  normal heart catheterization now.  Further workup of chest pain and in  regards to her sickle cell disease or musculoskeletal problems  can be  worked up by Dr. Loanne Drilling.  She will  resume Glucophage in 24 hours.  She  tolerated the procedure well.                                               Jenkins Rouge, M.D.    PN/MEDQ  D:  07/28/2003  T:  07/28/2003  Job:  [id number]   cc:   Hilliard Clark A. Loanne Drilling, M.D. [facility]   Ernestine Mcmurray, M.D. [facility]

## 2010-06-08 NOTE — Telephone Encounter (Signed)
[id number]

## 2010-06-08 NOTE — Telephone Encounter (Signed)
Called, spoke with pt.  She is aware advair will be changed to symbicort 2 puffs bid for insurance purposes.  She is aware symbicort rx sent to pharmacy.  She verbalized understanding of this.

## 2010-06-08 NOTE — Cardiovascular Report (Signed)
[place]  Patient:    CHELY, GAMBELL Visit Number: [id number] MRN: [id number]          Service Type: CAT Location: [facility] 2858 [id number] Attending Physician:  Allene Dillon Dictated by:   Allene Dillon, M.D. [facility] Proc. Date: 07/10/01 Admit Date:  07/10/2001 Discharge Date: 07/10/2001   CC:         Gloris Ham, M.D. [facility]  Cardiopulmonary Laboratory   Cardiac Catheterization  PROCEDURES PERFORMED: Left heart catheterization with coronary angiography and left ventriculography, and abdominal aortography.  INDICATIONS: The patient is 65 year old woman with multiple cardiovascular risk factors. She has been having progressive substernal chest pain over the past several months. An adenosine Cardiolite scan performed in the office revealed the possibility of apical ischemia. Because of the abnormal scan and progressive chest pain, we opted to proceed with cardiac catheterization.  DESCRIPTION OF PROCEDURE: A 6 French sheath was placed in the right femoral artery.  Standard Judkins 6 French catheters were utilized.  Contrast was Omnipaque.  There were no complications.  RESULTS:  HEMODYNAMICS: Left ventricular pressure 156/20, aortic pressure 145/80.  There was no aortic valve gradient.  LEFT VENTRICULOGRAM: Wall motion is normal. Ejection fraction estimated at greater than or equal to 60%. There is no mitral regurgitation.  ABDOMINAL AORTOGRAM: Reveals normal renal arteries, abdominal, aorta, and iliac arteries.  CORONARY ARTERIOGRAPHY: (Right dominant). The coronary arteries are very tortuous.  Left main is normal.  Left anterior descending artery has a 20% stenosis in the proximal vessel on a fairly sharp bend. The LAD gives rise to a single normal sized diagonal branch.  Left circumflex gives rise to a large OM-1, normal sized OM-2 and a small OM-3. The left circumflex has only minor luminal irregularities.  The right  coronary artery is a dominant vessel. The right coronary artery gives rise to a normal sized posterior descending artery and a small posterolateral branch. The right coronary artery is normal.  IMPRESSIONS: 1. Normal left ventricular systolic function. 2. No significant coronary artery disease identified. Dictated by:   Allene Dillon, M.D. [place] Attending Physician:  Allene Dillon DD:  07/10/01 TD:  07/13/01 Job: [id number]

## 2010-06-20 ENCOUNTER — Encounter: Payer: Self-pay | Admitting: *Deleted

## 2010-06-21 ENCOUNTER — Ambulatory Visit: Payer: MEDICARE | Admitting: Endocrinology

## 2010-06-26 ENCOUNTER — Ambulatory Visit (INDEPENDENT_AMBULATORY_CARE_PROVIDER_SITE_OTHER): Payer: Medicare Other | Admitting: Endocrinology

## 2010-06-26 ENCOUNTER — Encounter: Payer: Self-pay | Admitting: Endocrinology

## 2010-06-26 ENCOUNTER — Other Ambulatory Visit (INDEPENDENT_AMBULATORY_CARE_PROVIDER_SITE_OTHER): Payer: Medicare Other

## 2010-06-26 VITALS — BP 122/72 | HR 78 | Temp 99.8°F | Ht 64.5 in | Wt 284.8 lb

## 2010-06-26 DIAGNOSIS — E119 Type 2 diabetes mellitus without complications: Secondary | ICD-10-CM

## 2010-06-26 LAB — HEMOGLOBIN A1C: Hgb A1c MFr Bld: 9.3 % — ABNORMAL HIGH (ref 4.6–6.5)

## 2010-06-26 NOTE — Progress Notes (Signed)
Subjective:    Patient ID: Joann Gutierrez, female    DOB: February 16, 1945, 65 y.o.   MRN: [id number]  HPI pt states she feels well in general.  no cbg record, but states cbg's are occasionally low in the afternoon.  She says it is highest also in the afternoon.  Past Medical History  Diagnosis Date  . Allergy   . Depression   . Diabetes mellitus 1997    Type II   . GERD (gastroesophageal reflux disease)   . Hypertension   . Hyperlipidemia   . Osteoarthritis   . Osteoporosis   . Hair loss   . Carpal tunnel syndrome on left   . Anemia   . CAD (coronary artery disease)     Mild very minimal coronary disease with 20% obtuse marginal stenosis  . CVA (cerebral infarction)   . Diverticulosis   . Esophageal stricture   . Gastritis   . Adenomatous colon polyp   . Sickle cell trait   . Asthma     pt has cpap w/ her today  . COPD (chronic obstructive pulmonary disease)   . 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 Gastroparesis 08/21/2007  . RENAL INSUFFICIENCY 02/16/2009  . OSTEOARTHRITIS 08/09/2006  . Carpal tunnel syndrome, left   . Morbid obesity     Past Surgical History  Procedure Date  . Abdominal hysterectomy   . Replacement total knee 1998    Left  . Esophagogastroduodenoscopy 06/25/2006  . Breast lumpectomy     benign  . Erd 08/08/2000    History   Social History  . Marital Status: Widowed    Spouse Name: N/A    Number of Children: 1  . Years of Education: N/A   Occupational History  . Disabled    Social History Main Topics  . Smoking status: Former Smoker    Quit date: 04/18/1980  . Smokeless tobacco: Not on file  . Alcohol Use: No  . Drug Use: No  . Sexually Active: Not on file   Other Topics Concern  . Not on file   Social History Narrative   Previously worked as a Electrical engineer.Daily Caffeine Use-Coffee and Tea    Current Outpatient Prescriptions on File Prior to Visit  Medication Sig Dispense Refill  .  ALBUTEROL SULFATE IN Inhale 2.5 mg into the lungs 4 (four) times daily as needed.        Marland Kitchen aspirin 81 MG tablet Take 81 mg by mouth daily.        Marland Kitchen atorvastatin (LIPITOR) 40 MG tablet Take 40 mg by mouth daily.        . budesonide-formoterol (SYMBICORT) 160-4.5 MCG/ACT inhaler Inhale 2 puffs into the lungs 2 (two) times daily.  1 Inhaler  6  . Calcium Carbonate-Vit D-Min (CALTRATE PLUS PO) Take 600 mg by mouth daily.        . citalopram (CELEXA) 20 MG tablet Take 20 mg by mouth daily.        Marland Kitchen diltiazem (CARDIZEM CD) 240 MG 24 hr capsule Take 240 mg by mouth daily.        Marland Kitchen ezetimibe (ZETIA) 10 MG tablet Take 10 mg by mouth daily.        . fluocinonide (LIDEX) 0.05 % gel Apply topically 2 (two) times daily.        . fluticasone (FLONASE) 50 MCG/ACT nasal spray 2 sprays by Nasal route daily.        . Fluticasone-Salmeterol (ADVAIR DISKUS)  250-50 MCG/DOSE AEPB Inhale 1 puff into the lungs every 12 (twelve) hours.  60 each  3  . furosemide (LASIX) 80 MG tablet Take 80 mg by mouth 2 (two) times daily.        Marland Kitchen gabapentin (NEURONTIN) 300 MG capsule Take 300 mg by mouth 2 (two) times daily.       . hydrALAZINE (APRESOLINE) 10 MG tablet Take 10 mg by mouth 3 (three) times daily.        . insulin NPH (NOVOLIN N) 100 UNIT/ML injection 55 units each morning and 10 in the evening  30 mL  11  . lidocaine (LIDODERM) 5 % Place 1 patch onto the skin daily. Remove & Discard patch within 12 hours or as directed by MD       . metoCLOPramide (REGLAN) 10 MG tablet Take 10 mg by mouth 4 (four) times daily.        . metoprolol (LOPRESSOR) 50 MG tablet Take 50 mg by mouth 2 (two) times daily.        . Multiple Vitamin (MULTIVITAMIN PO) Take 1 tablet by mouth daily.        Marland Kitchen omeprazole (PRILOSEC) 40 MG capsule Take 40 mg by mouth every 30 (thirty) minutes. Before meals         . potassium chloride 20 MEQ/15ML (10%) solution Take 40 mEq by mouth 2 (two) times daily.       . raloxifene (EVISTA) 60 MG tablet Take 60 mg  by mouth daily.        . TraMADol HCl 50 MG TBSO Take 1 tablet by mouth 4 (four) times daily as needed.        . zafirlukast (ACCOLATE) 20 MG tablet Take 20 mg by mouth 2 (two) times daily.          Allergies  Allergen Reactions  . Promethazine Hcl     Family History  Problem Relation Age of Onset  . Colon cancer Brother   . Cancer Brother     Colon Cancer  . Cancer Mother     Liver Cancer  . Diabetes Mother   . Kidney disease Mother   . Heart disease Mother     age 37's  . Heart disease Father   . Heart attack Father     died of MI when pt was 42  . Sickle cell anemia Father   . Diabetes Sister   . Kidney disease Sister   . Heart disease Sister     age 64's  . Allergies Sister   . Diabetes Sister   . Kidney disease Sister   . Heart disease Sister     age 101's    BP 122/72  Pulse 78  Temp(Src) 99.8 F (37.7 C) (Oral)  Ht 5' 4.5" (1.638 m)  Wt 284 lb 12.8 oz (129.184 kg)  BMI 48.13 kg/m2  SpO2 93% Review of Systems Denies loc.    Objective:   Physical Exam GENERAL: no distress Neck - No masses or thyromegaly.     Lab Results  Component Value Date   HGBA1C 9.3* 06/26/2010      Assessment & Plan:  Dm: therapy limited by pt's request for least expensive meds, lack of cbg record, and by her need for a simple regimen.

## 2010-06-26 NOTE — Patient Instructions (Addendum)
blood tests are being ordered for you today.  please call [phone] to hear your test results.  You will be prompted to enter the 9-digit "MRN" number that appears at the top left of this page, followed by #.  Then you will hear the message. pending the test results, please continue the same insulin for now. good diet and exercise habits significanly improve the control of your diabetes.  please let me know if you wish to be referred to a dietician.  high blood sugar is very risky to your health.  you should see an eye doctor every year. controlling your blood pressure and cholesterol drastically reduces the damage diabetes does to your body.  this also applies to quitting smoking.  please discuss these with your doctor.  you should take an aspirin every day, unless you have been advised by a doctor not to. check your blood sugar 2 times a day.  vary the time of day when you check, between before the 3 meals, and at bedtime.  also check if you have symptoms of your blood sugar being too high or too low.  please keep a record of the readings and bring it to your next appointment here.  please call us sooner if you are having low blood sugar episodes. Please make a follow-up appointment in 3 months. (update: i left message on phone-tree:  Next step is to check cbg's).

## 2010-07-03 ENCOUNTER — Ambulatory Visit (INDEPENDENT_AMBULATORY_CARE_PROVIDER_SITE_OTHER): Payer: Medicare Other | Admitting: Pulmonary Disease

## 2010-07-03 ENCOUNTER — Inpatient Hospital Stay (HOSPITAL_COMMUNITY)
Admission: AD | Admit: 2010-07-03 | Discharge: 2010-07-11 | DRG: 155 | Disposition: A | Discharge status: Home / Self Care | Payer: Medicare Other | Source: Ambulatory Visit | Attending: Emergency Medicine | Admitting: Emergency Medicine

## 2010-07-03 ENCOUNTER — Encounter: Payer: Self-pay | Admitting: Pulmonary Disease

## 2010-07-03 ENCOUNTER — Inpatient Hospital Stay (HOSPITAL_COMMUNITY): Payer: Medicare Other

## 2010-07-03 DIAGNOSIS — N179 Acute kidney failure, unspecified: Secondary | ICD-10-CM | POA: Diagnosis present

## 2010-07-03 DIAGNOSIS — G4733 Obstructive sleep apnea (adult) (pediatric): Secondary | ICD-10-CM | POA: Diagnosis present

## 2010-07-03 DIAGNOSIS — J45909 Unspecified asthma, uncomplicated: Secondary | ICD-10-CM

## 2010-07-03 DIAGNOSIS — K219 Gastro-esophageal reflux disease without esophagitis: Secondary | ICD-10-CM | POA: Diagnosis present

## 2010-07-03 DIAGNOSIS — B370 Candidal stomatitis: Secondary | ICD-10-CM | POA: Diagnosis present

## 2010-07-03 DIAGNOSIS — E119 Type 2 diabetes mellitus without complications: Secondary | ICD-10-CM | POA: Diagnosis present

## 2010-07-03 DIAGNOSIS — J45901 Unspecified asthma with (acute) exacerbation: Secondary | ICD-10-CM | POA: Diagnosis present

## 2010-07-03 DIAGNOSIS — R071 Chest pain on breathing: Secondary | ICD-10-CM | POA: Diagnosis present

## 2010-07-03 DIAGNOSIS — J383 Other diseases of vocal cords: Principal | ICD-10-CM | POA: Diagnosis present

## 2010-07-03 LAB — GLUCOSE, CAPILLARY

## 2010-07-03 MED ORDER — LEVALBUTEROL HCL 1.25 MG/3ML IN NEBU
1.2500 mg | INHALATION_SOLUTION | RESPIRATORY_TRACT | Status: AC
  Administered 2010-07-03: 1.25 mg via RESPIRATORY_TRACT

## 2010-07-03 NOTE — Progress Notes (Signed)
Subjective:    Patient ID: Joann Gutierrez, female    DOB: Jun 12, 1945, 65 y.o.   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  She has been more short of breath.  She denies hemoptysis.  She has felt warm, but is not sure if she had fever.  She has sinus congestion and sore throat.  She has been getting a headache, and feels achy all over.  She denies chest pain, but has chest tightness.  She denies abdominal pain or diarrhea.  She has been gagging when she coughs.  She has some swelling in her legs.  Her inhalers aren't helping as much as before.  Past Medical History  Diagnosis Date  . Allergy   . Depression   . Diabetes mellitus 1997    Type II   . GERD (gastroesophageal reflux disease)   . Hypertension   . Hyperlipidemia   . Osteoarthritis   . Osteoporosis   . Hair loss   . Carpal tunnel syndrome on left   . Anemia   . CAD (coronary artery disease)     Mild very minimal coronary disease with 20% obtuse marginal stenosis  . CVA (cerebral infarction)   . Diverticulosis   . Esophageal stricture   . Gastritis   . Adenomatous colon polyp   . Sickle cell trait   . Asthma     pt has cpap w/ her today  . COPD (chronic obstructive pulmonary disease)   . 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 Gastroparesis 08/21/2007  . RENAL INSUFFICIENCY 02/16/2009  . OSTEOARTHRITIS 08/09/2006  . Carpal tunnel syndrome, left   . Morbid obesity      Family History  Problem Relation Age of Onset  . Colon cancer Brother   . Cancer Brother     Colon Cancer  . Cancer Mother     Liver Cancer  . Diabetes Mother   . Kidney disease Mother   . Heart disease Mother     age 25's  . Heart disease Father   . Heart attack Father     died of MI when pt was 50  . Sickle cell anemia Father   .  Diabetes Sister   . Kidney disease Sister   . Heart disease Sister     age 78's  . Allergies Sister   . Diabetes Sister   . Kidney disease Sister   . Heart disease Sister     age 29's     History   Social History  . Marital Status: Widowed    Spouse Name: N/A    Number of Children: 1  . Years of Education: N/A   Occupational History  . Disabled    Social History Main Topics  . Smoking status: Former Smoker -- 0.5 packs/day    Quit date: 04/18/1980  . Smokeless tobacco: Not on file  . Alcohol Use: No  . Drug Use: No  . Sexually Active: Not on file   Other Topics Concern  . Not on file   Social History Narrative   Previously worked as a Electrical engineer.Daily Caffeine Use-Coffee and Tea     Allergies  Allergen Reactions  . Promethazine Hcl      Outpatient Prescriptions Prior to Visit  Medication Sig Dispense Refill  . ALBUTEROL SULFATE  IN Inhale 2.5 mg into the lungs 4 (four) times daily as needed.        Marland Kitchen aspirin 81 MG tablet Take 81 mg by mouth daily.        Marland Kitchen atorvastatin (LIPITOR) 40 MG tablet Take 40 mg by mouth daily.        . budesonide-formoterol (SYMBICORT) 160-4.5 MCG/ACT inhaler Inhale 2 puffs into the lungs 2 (two) times daily.  1 Inhaler  6  . Calcium Carbonate-Vit D-Min (CALTRATE PLUS PO) Take 600 mg by mouth daily.        . citalopram (CELEXA) 20 MG tablet Take 20 mg by mouth daily.        Marland Kitchen diltiazem (CARDIZEM CD) 240 MG 24 hr capsule Take 240 mg by mouth daily.        Marland Kitchen ezetimibe (ZETIA) 10 MG tablet Take 10 mg by mouth daily.        . fluocinonide (LIDEX) 0.05 % gel Apply topically 2 (two) times daily.        . fluticasone (FLONASE) 50 MCG/ACT nasal spray 2 sprays by Nasal route daily.        . furosemide (LASIX) 80 MG tablet Take 80 mg by mouth 2 (two) times daily.        Marland Kitchen gabapentin (NEURONTIN) 300 MG capsule Take 300 mg by mouth 2 (two) times daily.       . hydrALAZINE (APRESOLINE) 10 MG tablet Take 10 mg by mouth 3 (three) times daily.          . insulin NPH (NOVOLIN N) 100 UNIT/ML injection 55 units each morning and 10 in the evening  30 mL  11  . metoCLOPramide (REGLAN) 10 MG tablet Take 10 mg by mouth 4 (four) times daily.        . metoprolol (LOPRESSOR) 50 MG tablet Take 50 mg by mouth 2 (two) times daily.        . Multiple Vitamin (MULTIVITAMIN PO) Take 1 tablet by mouth daily.        Marland Kitchen omeprazole (PRILOSEC) 40 MG capsule Take 40 mg by mouth every 30 (thirty) minutes. Before meals         . potassium chloride 20 MEQ/15ML (10%) solution Take 40 mEq by mouth 2 (two) times daily.       . raloxifene (EVISTA) 60 MG tablet Take 60 mg by mouth daily.        . TraMADol HCl 50 MG TBSO Take 1 tablet by mouth 4 (four) times daily as needed.        . zafirlukast (ACCOLATE) 20 MG tablet Take 20 mg by mouth 2 (two) times daily.        Marland Kitchen lidocaine (LIDODERM) 5 % Place 1 patch onto the skin daily. Remove & Discard patch within 12 hours or as directed by MD       . Fluticasone-Salmeterol (ADVAIR DISKUS) 250-50 MCG/DOSE AEPB Inhale 1 puff into the lungs every 12 (twelve) hours.  60 each  3      Review of Systems     Objective:   Physical Exam  BP 130/70  Pulse 88  Temp(Src) 99 F (37.2 C) (Oral)  Ht 5' 4.5" (1.638 m)  Wt 274 lb 3.2 oz (124.376 kg)  BMI 46.34 kg/m2  SpO2 93%  General - obese, dyspneic HEENT - clear nasal discharge, no oral exudate, no LAN Chest - b/l wheeze with scattered rhonchi Cardiac - s1s2 regular, no murmur Abd - soft, nontender Ext - minimal  ankle edema Neuro - normal strength, CN intact Psych - anxious      Assessment & Plan:   Acute asthma exacerbation Will admit to hospital.  Will give antibiotics and solumedrol.  Scheduled nebulizer.  Check chest xray, labs.    Updated Medication List Outpatient Encounter Prescriptions as of 07/03/2010  Medication Sig Dispense Refill  . ALBUTEROL SULFATE IN Inhale 2.5 mg into the lungs 4 (four) times daily as needed.        Marland Kitchen aspirin 81 MG tablet Take 81  mg by mouth daily.        Marland Kitchen atorvastatin (LIPITOR) 40 MG tablet Take 40 mg by mouth daily.        . budesonide-formoterol (SYMBICORT) 160-4.5 MCG/ACT inhaler Inhale 2 puffs into the lungs 2 (two) times daily.  1 Inhaler  6  . Calcium Carbonate-Vit D-Min (CALTRATE PLUS PO) Take 600 mg by mouth daily.        . citalopram (CELEXA) 20 MG tablet Take 20 mg by mouth daily.        Marland Kitchen diltiazem (CARDIZEM CD) 240 MG 24 hr capsule Take 240 mg by mouth daily.        Marland Kitchen ezetimibe (ZETIA) 10 MG tablet Take 10 mg by mouth daily.        . fluocinonide (LIDEX) 0.05 % gel Apply topically 2 (two) times daily.        . fluticasone (FLONASE) 50 MCG/ACT nasal spray 2 sprays by Nasal route daily.        . furosemide (LASIX) 80 MG tablet Take 80 mg by mouth 2 (two) times daily.        Marland Kitchen gabapentin (NEURONTIN) 300 MG capsule Take 300 mg by mouth 2 (two) times daily.       . hydrALAZINE (APRESOLINE) 10 MG tablet Take 10 mg by mouth 3 (three) times daily.        . insulin NPH (NOVOLIN N) 100 UNIT/ML injection 55 units each morning and 10 in the evening  30 mL  11  . metoCLOPramide (REGLAN) 10 MG tablet Take 10 mg by mouth 4 (four) times daily.        . metoprolol (LOPRESSOR) 50 MG tablet Take 50 mg by mouth 2 (two) times daily.        . Multiple Vitamin (MULTIVITAMIN PO) Take 1 tablet by mouth daily.        Marland Kitchen omeprazole (PRILOSEC) 40 MG capsule Take 40 mg by mouth every 30 (thirty) minutes. Before meals         . potassium chloride 20 MEQ/15ML (10%) solution Take 40 mEq by mouth 2 (two) times daily.       . raloxifene (EVISTA) 60 MG tablet Take 60 mg by mouth daily.        . TraMADol HCl 50 MG TBSO Take 1 tablet by mouth 4 (four) times daily as needed.        . zafirlukast (ACCOLATE) 20 MG tablet Take 20 mg by mouth 2 (two) times daily.        Marland Kitchen lidocaine (LIDODERM) 5 % Place 1 patch onto the skin daily. Remove & Discard patch within 12 hours or as directed by MD       . DISCONTD: Fluticasone-Salmeterol (ADVAIR DISKUS)  250-50 MCG/DOSE AEPB Inhale 1 puff into the lungs every 12 (twelve) hours.  60 each  3   Facility-Administered Encounter Medications as of 07/03/2010  Medication Dose Route Frequency Provider Last Rate Last Dose  . levalbuterol (XOPENEX)  nebulizer solution 1.25 mg  1.25 mg Nebulization NOW [name], MD   1.25 mg at 07/03/10 1506

## 2010-07-03 NOTE — Assessment & Plan Note (Signed)
Will admit to hospital.  Will give antibiotics and solumedrol.  Scheduled nebulizer.  Check chest xray, labs.

## 2010-07-04 ENCOUNTER — Inpatient Hospital Stay (HOSPITAL_COMMUNITY): Payer: Medicare Other

## 2010-07-04 DIAGNOSIS — G4730 Sleep apnea, unspecified: Secondary | ICD-10-CM

## 2010-07-04 DIAGNOSIS — J45901 Unspecified asthma with (acute) exacerbation: Secondary | ICD-10-CM

## 2010-07-04 DIAGNOSIS — J383 Other diseases of vocal cords: Secondary | ICD-10-CM

## 2010-07-04 LAB — COMPREHENSIVE METABOLIC PANEL
ALT: 16 U/L (ref 0–35)
AST: 21 U/L (ref 0–37)
Alkaline Phosphatase: 116 U/L (ref 39–117)
CO2: 23 mEq/L (ref 19–32)
Chloride: 95 mEq/L — ABNORMAL LOW (ref 96–112)
Creatinine, Ser: 3.08 mg/dL — ABNORMAL HIGH (ref 0.4–1.2)
GFR calc non Af Amer: 15 mL/min — ABNORMAL LOW (ref >60)
Potassium: 4.5 mEq/L (ref 3.5–5.1)
Sodium: 135 mEq/L (ref 135–145)
Total Bilirubin: 0.3 mg/dL (ref 0.3–1.2)

## 2010-07-04 LAB — CBC
MCV: 71.8 fL — ABNORMAL LOW (ref 78.0–100.0)
Platelets: ADEQUATE 10*3/uL (ref 150–400)
RBC: 4.82 MIL/uL (ref 3.87–5.11)
WBC: 5.3 10*3/uL (ref 4.0–10.5)

## 2010-07-04 LAB — BLOOD GAS, ARTERIAL
O2 Content: 2 L/min
O2 Saturation: 96.4 %
Patient temperature: 98.6
pH, Arterial: 7.386 (ref 7.350–7.400)

## 2010-07-04 LAB — GLUCOSE, CAPILLARY
Glucose-Capillary: 452 mg/dL — ABNORMAL HIGH (ref 70–99)
Glucose-Capillary: 382 mg/dL — ABNORMAL HIGH (ref 70–99)

## 2010-07-05 ENCOUNTER — Inpatient Hospital Stay (HOSPITAL_COMMUNITY): Payer: Medicare Other

## 2010-07-05 LAB — GLUCOSE, CAPILLARY
Glucose-Capillary: 359 mg/dL — ABNORMAL HIGH (ref 70–99)
Glucose-Capillary: 250 mg/dL — ABNORMAL HIGH (ref 70–99)
Glucose-Capillary: 402 mg/dL — ABNORMAL HIGH (ref 70–99)

## 2010-07-05 LAB — BASIC METABOLIC PANEL
BUN: 67 mg/dL — ABNORMAL HIGH (ref 6–23)
CO2: 25 mEq/L (ref 19–32)
Chloride: 93 mEq/L — ABNORMAL LOW (ref 96–112)
Creatinine, Ser: 2.75 mg/dL — ABNORMAL HIGH (ref 0.4–1.2)
GFR calc Af Amer: 21 mL/min — ABNORMAL LOW (ref >60)
Potassium: 4.1 mEq/L (ref 3.5–5.1)

## 2010-07-05 LAB — GLUCOSE, RANDOM: Glucose, Bld: 399 mg/dL — ABNORMAL HIGH (ref 70–99)

## 2010-07-05 LAB — MAGNESIUM: Magnesium: 2.4 mg/dL (ref 1.5–2.5)

## 2010-07-06 DIAGNOSIS — N179 Acute kidney failure, unspecified: Secondary | ICD-10-CM

## 2010-07-06 LAB — BASIC METABOLIC PANEL
BUN: 65 mg/dL — ABNORMAL HIGH (ref 6–23)
CO2: 24 mEq/L (ref 19–32)
Calcium: 8.4 mg/dL (ref 8.4–10.5)
Chloride: 94 mEq/L — ABNORMAL LOW (ref 96–112)
Creatinine, Ser: 2.02 mg/dL — ABNORMAL HIGH (ref 0.50–1.10)

## 2010-07-06 LAB — GLUCOSE, CAPILLARY
Glucose-Capillary: 411 mg/dL — ABNORMAL HIGH (ref 70–99)
Glucose-Capillary: 335 mg/dL — ABNORMAL HIGH (ref 70–99)
Glucose-Capillary: 252 mg/dL — ABNORMAL HIGH (ref 70–99)
Glucose-Capillary: 269 mg/dL — ABNORMAL HIGH (ref 70–99)

## 2010-07-06 NOTE — H&P (Signed)
Joann Gutierrez, MBAYE NO.:  [id number]  MEDICAL RECORD NO.:  [id number]  LOCATION:  1                         FACILITY:  [facility]  PHYSICIAN:  [name], MD        DATE OF BIRTH:  [birth year]  DATE OF ADMISSION:  07/03/2010 DATE OF DISCHARGE:                             HISTORY & PHYSICAL   ADMISSION DIAGNOSIS:  Asthma exacerbation.  CHIEF COMPLAINT:  Worsening dyspnea.  HISTORY OF PRESENT ILLNESS:  Joann Gutierrez is a 65 year old female who is followed in the pulmonary office for her dyspnea with asthma, rhinitis, postnasal drip, obesity, sleep apnea, and secondary pulmonary hypertension.  She developed increasing cough with yellow sputum over the previous 5 days.  She has been getting progressively more short of breath.  She denied hemoptysis.  She has been feeling warm, but did not actually check her temperature.  She was having more sinus congestion and sore throat.  She was also getting headache and feels achy all over. She denies chest pain, but did have chest tightness.  She was not having any abdominal pain or diarrhea.  She did have a gagging sensation when she coughed.  She also noted some swelling in her ankles.  She has been using her inhalers, but did not feel like these were helping as much. She was given a nebulizer treatment in the office, but this did not improve her symptoms.  PAST MEDICAL HISTORY:  Significant for, 1. Allergies. 2. Depression. 3. Type 2 diabetes mellitus. 4. Gastroesophageal reflux disease. 5. Hypertension. 6. Hyperlipidemia. 7. Osteoarthritis. 8. Osteoporosis. 9. Carpal tunnel syndrome. 10.Anemia. 11.Nonobstructive coronary disease. 12.CVA. 13.Diverticulosis. 14.Esophageal stricture. 15.Gastritis. 16.Colon polyp. 17.Sickle-cell trait. 18.Asthma. 19.COPD. 20.Peripheral neuropathy. 21.Secondary pulmonary hypertension. 22.Gastroparesis. 23.Renal insufficiency.  1. Obesity.  FAMILY HISTORY:  Significant  for colon cancer, diabetes, heart disease, and sickle cell anemia.  SOCIAL HISTORY:  She is married.  She quit smoking in 1982.  OUTPATIENT MEDICATIONS: 1. Albuterol as needed. 2. Aspirin 81 mg daily. 3. Lipitor 40 mg daily. 4. Symbicort 160/4.5 mcg 2 puffs b.i.d. 5. Caltrate plus p.o. 600 mg daily. 6. Celexa 20 mg daily. 7. Cardizem CD 240 mg daily. 8. Zetia 10 mg daily. 9. Lidex 0.05% gel topical b.i.d. 10.Flonase 2 sprays daily. 11.Lasix 80 mg daily. 12.Neurontin 300 mg b.i.d. 13.Hydralazine 10 mg t.i.d. 14.Insulin NPH 55 units in the morning and 10 units in the evening. 15.Reglan 10 mg 4 times a day. 16.Lopressor 50 mg b.i.d. 17.Multivitamin daily. 18.Prilosec 40 mg daily. 19.Potassium 40 mEq b.i.d. 20.Evista 60 mg daily. 21.Tramadol 50 mg 4 times a day as needed. 22.Accolate 20 mg b.i.d.  REVIEW OF SYSTEMS:  Unremarkable except for as stated above.  PHYSICAL EXAMINATION:  VITAL SIGNS:  She was initially seen in the office.  Her temperature is 99, heart rate was 88, blood pressure 130/70, oxygen saturation 92% on room air. GENERAL:  She is obese, dyspneic, able to speak, but has to stop to catch her breath in between sentences. HEENT:  Pupils reactive.  Extraocular muscle intact.  She is wearing glasses.  There is no sinus tenderness.  She has a clear nasal discharge.  There is no oral exudate.  There is no lymphadenopathy, no  thyromegaly. HEART:  S1 and S2, regular rate and rhythm.  No murmur. CHEST:  She had scattered rhonchi with prolonged expiratory phase and diffuse bilateral wheezing. ABDOMEN:  Soft, nontender.  Positive bowel sounds.  No organomegaly. EXTREMITIES:  There is minimal ankle edema.  There is no sinus clubbing. NEUROLOGIC:  She has normal strength.  Cranial nerves are intact. PSYCH:  She is anxious. SKIN:  There are no rashes.  Chest x-ray and lab tests are pending at this time.  IMPRESSION: 1. Acute asthma exacerbation.  She is to be  admitted to the hospital.     I will start her on intravenous Solu-Medrol and doxycycline.  I     will start her on scheduled nebulizer therapy. 2. Obstructive sleep apnea.  She is to continue on CPAP at night. 3. Diabetes mellitus.  I will continue her on carbohydrate modified     diet as well as sliding scale insulin while she is on systemic     corticosteroids. 4. Gastroesophageal reflux disease.  I will continue her on Protonix. 5. Hypertension.  I will continue her outpatient antihypertensive     regimen except that I would hold her Lopressor for the time being     until her acute bronchospasm has improved. 6. Hyperlipidemia.  She will continue on her outpatient regimen. 7. History of anemia.  I will follow up on her CBC. 8. Coronary disease.  She is to continue on aspirin.     [name], MD     VS/MEDQ  D:  07/03/2010  T:  07/04/2010  Job:  [id number]  Electronically Signed by [name] MD on 07/06/2010 04:33:39 PM

## 2010-07-07 LAB — CBC
Platelets: 247 10*3/uL (ref 150–400)
RBC: 4.61 MIL/uL (ref 3.87–5.11)
RDW: 14.4 % (ref 11.5–15.5)
WBC: 11.9 10*3/uL — ABNORMAL HIGH (ref 4.0–10.5)

## 2010-07-07 LAB — GLUCOSE, CAPILLARY
Glucose-Capillary: 224 mg/dL — ABNORMAL HIGH (ref 70–99)
Glucose-Capillary: 319 mg/dL — ABNORMAL HIGH (ref 70–99)
Glucose-Capillary: 232 mg/dL — ABNORMAL HIGH (ref 70–99)

## 2010-07-07 LAB — BASIC METABOLIC PANEL
CO2: 29 mEq/L (ref 19–32)
Chloride: 102 mEq/L (ref 96–112)
Creatinine, Ser: 1.49 mg/dL — ABNORMAL HIGH (ref 0.50–1.10)
GFR calc Af Amer: 42 mL/min — ABNORMAL LOW (ref >60)
Potassium: 3.7 mEq/L (ref 3.5–5.1)
Sodium: 140 mEq/L (ref 135–145)

## 2010-07-07 LAB — HEMOGLOBIN A1C
Hgb A1c MFr Bld: 10.2 % — ABNORMAL HIGH (ref <5.7)
Mean Plasma Glucose: 246 mg/dL — ABNORMAL HIGH (ref <117)

## 2010-07-08 LAB — GLUCOSE, CAPILLARY
Glucose-Capillary: 152 mg/dL — ABNORMAL HIGH (ref 70–99)
Glucose-Capillary: 285 mg/dL — ABNORMAL HIGH (ref 70–99)

## 2010-07-09 ENCOUNTER — Telehealth: Payer: Self-pay

## 2010-07-09 LAB — GLUCOSE, CAPILLARY
Glucose-Capillary: 319 mg/dL — ABNORMAL HIGH (ref 70–99)
Glucose-Capillary: 174 mg/dL — ABNORMAL HIGH (ref 70–99)

## 2010-07-09 LAB — MAGNESIUM: Magnesium: 1.5 mg/dL (ref 1.5–2.5)

## 2010-07-09 LAB — CARDIAC PANEL(CRET KIN+CKTOT+MB+TROPI)
Relative Index: 1.7 (ref 0.0–2.5)
Total CK: 127 U/L (ref 7–177)
Troponin I: <0.3 ng/mL (ref <0.30)

## 2010-07-09 LAB — BASIC METABOLIC PANEL
Chloride: 100 mEq/L (ref 96–112)
Creatinine, Ser: 1.19 mg/dL — ABNORMAL HIGH (ref 0.50–1.10)
GFR calc Af Amer: 55 mL/min — ABNORMAL LOW (ref >60)
Sodium: 143 mEq/L (ref 135–145)

## 2010-07-09 LAB — PHOSPHORUS: Phosphorus: 3.3 mg/dL (ref 2.3–4.6)

## 2010-07-09 MED ORDER — INSULIN NPH (HUMAN) (ISOPHANE) 100 UNIT/ML ~~LOC~~ SUSP: SUBCUTANEOUS | Status: DC

## 2010-07-09 NOTE — Telephone Encounter (Signed)
Pharmacy called stating Novolin N not covered by [employer], pharmacy is requesting Rx change to Humalin N, please advise.

## 2010-07-09 NOTE — Telephone Encounter (Signed)
i changed and sent

## 2010-07-10 ENCOUNTER — Other Ambulatory Visit: Payer: Self-pay | Admitting: *Deleted

## 2010-07-10 ENCOUNTER — Telehealth: Payer: Self-pay | Admitting: Gastroenterology

## 2010-07-10 LAB — GLUCOSE, CAPILLARY
Glucose-Capillary: 71 mg/dL (ref 70–99)
Glucose-Capillary: 472 mg/dL — ABNORMAL HIGH (ref 70–99)
Glucose-Capillary: 81 mg/dL (ref 70–99)

## 2010-07-10 LAB — BASIC METABOLIC PANEL
BUN: 26 mg/dL — ABNORMAL HIGH (ref 6–23)
Calcium: 8.7 mg/dL (ref 8.4–10.5)
GFR calc non Af Amer: 45 mL/min — ABNORMAL LOW (ref >60)
Glucose, Bld: 225 mg/dL — ABNORMAL HIGH (ref 70–99)
Sodium: 137 mEq/L (ref 135–145)

## 2010-07-10 MED ORDER — METOPROLOL TARTRATE 50 MG PO TABS: 50.0000 mg | ORAL_TABLET | Freq: Two times a day (BID) | ORAL | Status: DC

## 2010-07-10 NOTE — Telephone Encounter (Signed)
Pt states that her throat is bothering her again like it did last time when she had thrush. Pt requesting an appt. Pt scheduled to see Dr. Deatra Ina 07/16/10@2 :30pm. Pt aware of appt date and time.

## 2010-07-10 NOTE — Telephone Encounter (Signed)
R'cd fax from [facility] for refill of Metoprolol rx   Last OV-06/26/2010   Last Filled-02/14/2010

## 2010-07-11 LAB — GLUCOSE, CAPILLARY
Glucose-Capillary: 168 mg/dL — ABNORMAL HIGH (ref 70–99)
Glucose-Capillary: 167 mg/dL — ABNORMAL HIGH (ref 70–99)
Glucose-Capillary: 305 mg/dL — ABNORMAL HIGH (ref 70–99)

## 2010-07-13 ENCOUNTER — Telehealth: Payer: Self-pay | Admitting: Emergency Medicine

## 2010-07-13 NOTE — Telephone Encounter (Signed)
Pt states she was in the hospital June 12-20 for "asthma" and was given order for O2 2 liters that she left at her PCP office in [place]. I advised the pt to call her PCP and have them fax the original order to [facility] and to give [place] a follow up call today of same.

## 2010-07-16 ENCOUNTER — Ambulatory Visit (INDEPENDENT_AMBULATORY_CARE_PROVIDER_SITE_OTHER): Payer: Medicare Other | Admitting: Nurse Practitioner

## 2010-07-16 ENCOUNTER — Encounter: Payer: Self-pay | Admitting: Nurse Practitioner

## 2010-07-16 DIAGNOSIS — D509 Iron deficiency anemia, unspecified: Secondary | ICD-10-CM | POA: Insufficient documentation

## 2010-07-16 DIAGNOSIS — R1310 Dysphagia, unspecified: Secondary | ICD-10-CM | POA: Insufficient documentation

## 2010-07-16 DIAGNOSIS — Z800 Family history of malignant neoplasm of digestive organs: Secondary | ICD-10-CM | POA: Insufficient documentation

## 2010-07-16 DIAGNOSIS — K219 Gastro-esophageal reflux disease without esophagitis: Secondary | ICD-10-CM

## 2010-07-16 NOTE — Assessment & Plan Note (Signed)
Several week history of odynophagia and recurrent dysphagia after being treated with steroids for asthma exacerbation.Symptoms refractory to Diflucan and what sounds like Magic mouthwash as well .Patient has a history of recurrent esophageal strictures, last dilated late March of this year.  While she could have a recurrent stricture, that should not cause odynophagia so need to look for other causes. For further evaluation patient will be scheduled for upper endoscopy with possible dilation at the time of the procedure.The benefits, risks, and potential complications of EGD with possible biopsies and/or dilation were discussed with the patient and she agrees to proceed. Continue daily proton pump inhibitor.

## 2010-07-16 NOTE — Assessment & Plan Note (Addendum)
Breakthrough pyrosis on daily proton pump inhibitor. Further evaluation at time of upper endoscopy. She may need twice daily proton pump inhibitor therapy. Patient had mildly abnormal gastric emptying study in 2009. While gastroparesis can exacerbate GERD symptoms, patient does not complain of nausea and she is on Metoclopramide 4 times daily.

## 2010-07-16 NOTE — Patient Instructions (Signed)
We have scheduled the Endoscopy with  Dr Erskine Emery on 07-19-2010. The patient is asked to make an attempt to improve diet and exercise patterns to aid in medical management of this problem.

## 2010-07-16 NOTE — Progress Notes (Signed)
Joann Gutierrez [id number] 06/06/1945   HISTORY OR PRESENT ILLNESS : Joann Gutierrez is a 65 year old female with multiple medical problems including, but not limited to: diabetes, hypertension, hyperlipidemia, renal insufficiency, morbid obesity and COPD. She is known to Dr. Deatra Ina for personal history of colon polyps, family history colorectal cancer in brother and recurrent esophageal strictures. She has undergone several upper endoscopies with dilations through the years, her last one being late March of this year. Patient was hospitalized earlier this month with an asthmatic exacerbation. She was treated with Prednisone. While hospitalized the patient developed odynophagia and recurrent dysphasia. According to the hospital discharge summary, the patient was treated for thrush with Diflucan. She also describes what sounds like either Magic mouthwash or nystatin swish and swallow but I did not see that on the discharge summary.  Patient has not had any significant improvement in her odynophagia/dysphagia. She is taking omeprazole 40 mg daily but has breakthrough heartburn.   No other gastrointestinal complaints .of note, last colonoscopy June 2007 revealed only diverticulosis   Current Medications, Allergies, Past Medical History, Past Surgical History, Family History and Social History were reviewed in [employer] record.   PHYSICAL EXAMINATION : General: Obese black female in no acute distress Head: Normocephalic and atraumatic Eyes:  sclerae anicteric,conjunctive pink. Ears: Normal auditory acuity Mouth: No deformity or lesions. No significant exudate. Neck: Supple, no masses. Bilateral anterior cervical tenderness without appreciable lymphadenopathy.  Lungs:  Wheezing anterior chest when laying flat for examination Heart: Regular rate and rhythm; no murmurs heard Abdomen: Soft, nondistended, nontender. No masses or hepatomegaly noted. Normal bowel sounds Rectal:  Not done Musculoskeletal: Symmetrical with no gross deformities  Skin: No lesions on visible extremities Extremities: No edema or deformities noted Neurological: Alert oriented x 4, grossly nonfocal Cervical Nodes:  No significant cervical adenopathy Psychological:  Alert and cooperative. Normal mood and affect  ASSESSMENT AND PLAN :

## 2010-07-16 NOTE — Assessment & Plan Note (Signed)
Patient gives family history colon cancer in brother. Her last colonoscopy was in 2007. Date of surveillance examination to be determined by Dr. Deatra Ina, patient's primary gastroenterologist.

## 2010-07-16 NOTE — Assessment & Plan Note (Signed)
Chronic microcytic anemia, stable.

## 2010-07-17 NOTE — Telephone Encounter (Signed)
Spoke with pt. She states nothing needed at this time. Her PCP has ordered ONO to see if she is needing o2 at hs.

## 2010-07-17 NOTE — Progress Notes (Signed)
Reviewed and agree with management. Georgia Delsignore D. Azriel Jakob, M.D., FACG  

## 2010-07-18 ENCOUNTER — Other Ambulatory Visit: Payer: Self-pay | Admitting: *Deleted

## 2010-07-18 ENCOUNTER — Ambulatory Visit (INDEPENDENT_AMBULATORY_CARE_PROVIDER_SITE_OTHER): Payer: Medicare Other | Admitting: Adult Health

## 2010-07-18 ENCOUNTER — Encounter: Payer: Self-pay | Admitting: Adult Health

## 2010-07-18 DIAGNOSIS — J45909 Unspecified asthma, uncomplicated: Secondary | ICD-10-CM

## 2010-07-18 MED ORDER — FLUTICASONE PROPIONATE 50 MCG/ACT NA SUSP: 2.0000 | Freq: Every day | NASAL | Status: DC

## 2010-07-18 NOTE — Patient Instructions (Signed)
Continue on Symbicort 2 puffs Twice daily  -brush/rinse and gargle after use.  Avoid extreme heat outside  Follow up Dr. Halford Chessman in 3 weeks and As needed   Please contact office for sooner follow up if symptoms do not improve or worsen or seek emergency care

## 2010-07-19 ENCOUNTER — Other Ambulatory Visit: Payer: Medicare Other | Admitting: Gastroenterology

## 2010-07-23 ENCOUNTER — Encounter: Payer: Self-pay | Admitting: Gastroenterology

## 2010-07-23 ENCOUNTER — Ambulatory Visit (AMBULATORY_SURGERY_CENTER): Payer: Medicare Other | Admitting: Gastroenterology

## 2010-07-23 DIAGNOSIS — K222 Esophageal obstruction: Secondary | ICD-10-CM

## 2010-07-23 DIAGNOSIS — R1310 Dysphagia, unspecified: Secondary | ICD-10-CM

## 2010-07-23 LAB — GLUCOSE, CAPILLARY
Glucose-Capillary: 74 mg/dL (ref 70–99)
Glucose-Capillary: 89 mg/dL (ref 70–99)

## 2010-07-23 MED ORDER — SODIUM CHLORIDE 0.9 % IV SOLN: 500.0000 mL | INTRAVENOUS | Status: DC

## 2010-07-23 MED ORDER — DEXTROSE 5 % IV SOLN: INTRAVENOUS | Status: DC

## 2010-07-23 NOTE — Progress Notes (Signed)
Pt tllerated the exam very well. MAW

## 2010-07-23 NOTE — Patient Instructions (Addendum)
Esophageal Stricture (Narrowing) The esophagus is the long, narrow tube which carries food and liquid from the mouth to the stomach. Sometimes a part of the esophagus becomes narrow and makes it difficult, painful, or even impossible to swallow. This is called an esophageal stricture.  SYMPTOMS Some of the problems are difficulty swallowing or pain with swallowing. CAUSES Common causes of blockage or strictures of the esophagus are:  Exposure of the lower esophagus to the acid from the stomach may cause narrowing.   Hiatal hernia in which a small part of the stomach bulges up through the diaphragm can cause a narrowing in the bottom of the esophagus.   Scleroderma is a tissue disorder that affects the esophagus and makes swallowing difficult.   Achalasia is an absence of nerves in the lower esophagus and to the esophageal sphincter. This absence of nerves may be congenital (present since birth). This can cause irregular spasms which do not allow food and fluid through.   Strictures may develop from swallowing materials which damage the esophagus. Examples are acids or alkalis such as lye.   Schatzki's Ring is a narrow ring of non-cancerous tissue which narrows the lower esophagus. The cause of this is unknown.   Growths can block the esophagus.  DIAGNOSIS Your caregiver often suspects this problem by taking a medical history. They will also do a physical exam. They may then take X-rays and/or perform an endoscopy. Endoscopy is an exam in which a tube like a small flexible telescope is used to look at your esophagus.  TREATMENT AND PROCEDURE  One form of treatment is to dilate the narrow area. This means to stretch it.   When this is not successful, chest surgery may be required. This is a much more extensive form of treatment with a longer recovery time.  Both of the above treatments make the passage of food and water into the stomach easier. They also make it easier for stomach contents  to bubble back into the esophagus. Special medications may be used following the procedure to help prevent further narrowing. Medications may be used to lower the amount of acid in the stomach juice.  SEEK IMMEDIATE MEDICAL CARE IF:  Your swallowing is becoming more painful, difficult, or you are unable to swallow.   You vomit up blood.   You develop black tarry stools.   You develop chills or an unexplained fever of over 101 F (38.3 C).   You develop chest or abdominal pain.   You develop shortness of breath, feel lightheaded, or faint.  Follow up with medical care as your caregiver suggests. Document Released: 09/17/2005 Document Re-Released: 11/04/2006 [facility] Patient Information 2011 [place].  Impression:  Stricture (handout given) s/p dilation (diet handout given)  Dilations as needed.  Continue medications as you were taking them prior to your procedure

## 2010-07-24 ENCOUNTER — Telehealth: Payer: Self-pay

## 2010-07-24 NOTE — Progress Notes (Signed)
  Subjective:    Patient ID: Joann Gutierrez, female    DOB: [birth year], 65 y.o.   MRN: [id number]  HPI 65 yo female with dyspnea with asthma, rhinitis with post-nasal drip, obesity, OSA on CPAP 9 cm and 2nd pulmonary hypertension  07/18/10 [facility] Visit.  Pt returns for a post hospital follow up . She was admitted 07/03/10 for an asthmatic bronchitic exacerbation . Tx with IV abx, nebs and  Steroids. CXR with no acute process noted. Since discharge she is feeling better . Does have some lingering cough and congestion but  Feel so much better. No fever or chest pain .    Review of Systems Constitutional:   No  weight loss, night sweats,  Fevers, chills, fatigue, or  lassitude.  HEENT:   No headaches,  Difficulty swallowing,  Tooth/dental problems, or  Sore throat,                No sneezing, itching, ear ache, nasal congestion, post nasal drip,   CV:  No chest pain,  Orthopnea, PND, swelling in lower extremities, anasarca, dizziness, palpitations, syncope.   GI  No heartburn, indigestion, abdominal pain, nausea, vomiting, diarrhea, change in bowel habits, loss of appetite, bloody stools.   Resp: No shortness of breath with exertion or at rest.  No excess mucus, no productive cough,  No non-productive cough,  No coughing up of blood.  No change in color of mucus.  No wheezing.  No chest wall deformity  Skin: no rash or lesions.  GU: no dysuria, change in color of urine, no urgency or frequency.  No flank pain, no hematuria   MS:  No joint pain or swelling.  No decreased range of motion.  No back pain.  Psych:  No change in mood or affect. No depression or anxiety.  No memory loss.         Objective:   Physical Exam GEN: A/Ox3; pleasant , NAD,  HEENT:  /AT,  EACs-clear, TMs-wnl, NOSE-clear, THROAT-clear, no lesions, no postnasal drip or exudate noted.   NECK:  Supple w/ fair ROM; no JVD; normal carotid impulses w/o bruits; no thyromegaly or nodules palpated; no  lymphadenopathy.  RESP  Coarse BS  w/o, wheezes/ rales/ or rhonchi.no accessory muscle use, no dullness to percussion  CARD:  RRR, no m/r/g  + peripheral edema, pulses intact, no cyanosis or clubbing.  GI:   Soft & nt; nml bowel sounds; no organomegaly or masses detected.  Musco: Warm bil, no deformities or joint swelling noted.   Neuro: alert, no focal deficits noted.    Skin: Warm, no lesions or rashes         Assessment & Plan:

## 2010-07-24 NOTE — Telephone Encounter (Signed)

## 2010-07-24 NOTE — Assessment & Plan Note (Signed)
Recent flare:  Now resolving  Plan  Continue on Symbicort 2 puffs Twice daily  -brush/rinse and gargle after use.  Avoid extreme heat outside  Follow up Dr. Halford Chessman in 3 weeks and As needed   Please contact office for sooner follow up if symptoms do not improve or worsen or seek emergency care

## 2010-07-30 ENCOUNTER — Encounter: Payer: Self-pay | Admitting: Internal Medicine

## 2010-07-31 NOTE — Progress Notes (Signed)
Reviewed and agree with plan.

## 2010-08-08 ENCOUNTER — Ambulatory Visit: Payer: Medicare Other | Admitting: Adult Health

## 2010-08-08 NOTE — Discharge Summary (Signed)
Joann Gutierrez, Joann Gutierrez NO.:  [id number]  MEDICAL RECORD NO.:  [id number]  LOCATION:  [id number]                         FACILITY:  [facility]  PHYSICIAN:  Collene Gobble, MD    DATE OF BIRTH:  13-Mar-1945  DATE OF ADMISSION:  07/03/2010 DATE OF DISCHARGE:  07/11/2010                              DISCHARGE SUMMARY   DISCHARGE DIAGNOSES:  Consist of: 1. Asthma, vocal cord dysfunction and gastroesophageal reflux disease. 2. Chest wall pain with negative cardiac enzymes. 3. Acute renal failure. 4. Diabetes mellitus. 5. Thrush.  HISTORY OF PRESENT ILLNESS:  Joann Gutierrez is a 65 year old morbidly obese African-American female, who presented to [facility] on July 03, 2010 in acute respiratory distress with no vocal cord dysfunction, questionable asthma with gastroesophageal reflux disease.  She was admitted for further evaluation and treatment at that time.  LABORATORY DATA:  Hemoglobin 10.5, hematocrit 33.4, platelets 247,000, WBC is 11.9.  Sodium 137, potassium 3.7, chloride 97, CO2 is 34, BUN is 26, creatinine 1.20, glucose is 225, max creatinine was 2.75, max glucose was 3.35.  Troponin I is 0.30, CK-MB is 2.1, CK is 127, calcium 8.7, magnesium 1.5, phosphorus 3.3.  Hemoglobin A1c was 10.2.  DIAGNOSTIC STUDIES:  Chest x-ray demonstrated chronic bronchitic type changes without acute infiltrate.  HOSPITAL COURSE BY DISCHARGE DIAGNOSES: 1. Asthma with vocal cord dysfunction and gastroesophageal reflux     disease:  He is admitted to [facility] and treated with     steroids and antimicrobial therapy along with proton pump     inhibitors.  She reached maximal hospital benefit by July 11, 2010.     Note, that this appears to be more of a component of vocal cord     dysfunction that would probably needs to be evaluated and treated     at a vocal cord clinic in the future.  Her gastroesophageal reflux     disease is being treated.  She is O2  dependent as noted by sats     less than 88% on ambulation in the hall.  She has been placed on O2     24x7 at 2 liters. 2. Chest wall pain:  She had negative cardiac enzymes.  She has had a     cough, which is most likely chest wall discomfort and this remained     stable at this time. 3. Acute renal insufficiency:  She reached peak creatinine as noted in     the labs, has returned to her normal creatinine at this time. 4. Diabetes mellitus:  Despite being on steroids, her glucose level     has gone down, which is indicative that she is not following     appropriate diet at home.  Her insulin has been cut in half at the     time of discharge from 60 to 30 units of NPH in the a.m.  She had     been instructed to monitor her blood glucose.  Should it start to     rise, she has to go back to rather higher dose of insulin. 5. Thrush:  She is noted to have oral thrush.  She is given Diflucan.  She is currently on day 5 of 7 days of antifungal treatment.  DISCHARGE MEDICATIONS: 1. She is on fluconazole 100 mg by mouth daily. 2. Prednisone on a taper 10 mg tablets two tablets for days, one     tablet for two days and then stop. 3. She is on Humulin insulin NPH 30 units every morning, now this is     half dose from her previous 60 units. 4. She has albuterol 2.5 inhaled every 4 hours as needed. 5. Aspirin 81 mg daily. 6. Calcium carbonate vitamin D 600/400 every morning. 7. Cardizem 240 every morning. 8. Celexa 20 mg every morning. 9. Fluticasone nasal 2 sprays every morning. 10.Furosemide 80 mg by mouth twice daily. 11.Neurontin 300 mg twice daily. 12.Hydralazine 1 tablet 25 mg four times daily. 13.Lidocaine patch topically every morning. 14.Meloxicam 15 mg one tablet four times daily. 15.Reglan 10 mg by mouth four times daily with a.c. and h.s. 16.Multivitamins daily. 17.Omeprazole 20 mg by mouth daily. 18.Potassium chloride two tablets 20 mEq twice daily. 19.Simvastatin 80 mg  daily at bedtime. 20.Tramadol one tablet by mouth daily at bedtime as needed. 21.Zetia 10 mg 1 tablet daily at bedtime.  She is to stop taking her: 1. Metoprolol. 2. Amlodipine . 3. Cyclobenzaprine. 4. Cod liver oil.  FOLLOWUP:  She has a follow-up appointment with Dr. Rexene Edison on July 18, 2010 at 10:00 a.m. and she will follow up with Dr. Halford Chessman at a later date.  DIET:  Her diet is a low-carb diabetic diet, which she has been noncompliant within the past.  ACTIVITIES:  Increase activity slowly.  DISCHARGE INSTRUCTIONS:  She is now on home O2 at 2 liters 24x7.  She will be evaluated in the office within 7 days to closely monitor her glucose and her oxygen needs.     Gaylyn Lambert, MSN, ACNP   ______________________________ Collene Gobble, MD   SM/MEDQ  D:  07/11/2010  T:  07/11/2010  Job:  [id number]  Electronically Signed by Gaylyn Lambert MSN ACNP on 07/17/2010 02:31:06 PM Electronically Signed by Baltazar Apo MD on 08/08/2010 09:44:43 PM

## 2010-08-09 ENCOUNTER — Other Ambulatory Visit: Payer: Self-pay | Admitting: *Deleted

## 2010-08-09 MED ORDER — "INSULIN SYRINGE-NEEDLE U-100 31G X 5/16"" 1 ML MISC": Status: DC

## 2010-08-14 ENCOUNTER — Ambulatory Visit (INDEPENDENT_AMBULATORY_CARE_PROVIDER_SITE_OTHER): Payer: Medicare Other | Admitting: Adult Health

## 2010-08-14 ENCOUNTER — Encounter: Payer: Self-pay | Admitting: Adult Health

## 2010-08-14 VITALS — BP 166/90 | HR 68 | Temp 98.8°F | Ht 64.5 in | Wt 273.6 lb

## 2010-08-14 DIAGNOSIS — J45909 Unspecified asthma, uncomplicated: Secondary | ICD-10-CM

## 2010-08-14 DIAGNOSIS — G4733 Obstructive sleep apnea (adult) (pediatric): Secondary | ICD-10-CM

## 2010-08-14 MED ORDER — BUDESONIDE-FORMOTEROL FUMARATE 160-4.5 MCG/ACT IN AERO: 2.0000 | INHALATION_SPRAY | Freq: Two times a day (BID) | RESPIRATORY_TRACT | Status: DC

## 2010-08-14 NOTE — Assessment & Plan Note (Signed)
Weight loss.  Cont cpap at night  Order sent for download.

## 2010-08-14 NOTE — Patient Instructions (Signed)
Continue on Symbicort 2 puffs Twice daily  -brush/rinse and gargle after use. -we sent a refill to pharm.  Ventolin is your rescue inhaler-to be use As needed  Only.  Avoid extreme heat outside  Work on weight loss.  Follow up Dr. Halford Chessman in 2  months  and As needed   Please contact office for sooner follow up if symptoms do not improve or worsen or seek emergency care

## 2010-08-14 NOTE — Assessment & Plan Note (Signed)
Improved control  Pt education on maintence vs rescue inhaler. Cont on symbicort

## 2010-08-14 NOTE — Progress Notes (Signed)
  Subjective:    Patient ID: Joann Gutierrez, female    DOB: Jan 23, 1945, 65 y.o.   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  Pt returns for a post hospital follow up . She was admitted 07/03/10 for an asthmatic bronchitic exacerbation . Tx with IV abx, nebs and  Steroids. CXR with no acute process noted. Since discharge she is feeling better . Does have some lingering cough and congestion but  Feel so much better. No fever or chest pain . >>no changes, cont on symbicort   08/14/2010 Follow up  Pt returns for follow up. Since last visit she is doing well. No flare in wheezing or dyspnea. NO ER visits   Wears CPAP hrs everynight on avg 4-6 hr each night. Masks fits well. Unable to loose any weight yet.   Using Symbicort every day. Needs refill to pharmacy.  Uses  SABA on average 3 x week.    Review of Systems  Constitutional:   No  weight loss, night sweats,  Fevers, chills, fatigue, or  lassitude.  HEENT:   No headaches,  Difficulty swallowing,  Tooth/dental problems, or  Sore throat,                No sneezing, itching, ear ache, nasal congestion, post nasal drip,   CV:  No chest pain,  Orthopnea, PND, swelling in lower extremities, anasarca, dizziness, palpitations, syncope.   GI  No heartburn, indigestion, abdominal pain, nausea, vomiting, diarrhea, change in bowel habits, loss of appetite, bloody stools.   Resp:    No non-productive cough,  No coughing up of blood.  No change in color of mucus.  No wheezing.  No chest wall deformity  Skin: no rash or lesions.  GU: no dysuria, change in color of urine, no urgency or frequency.  No flank pain, no hematuria   MS:  No joint pain or swelling.  No decreased range of motion.  No back pain.  Psych:  No change in mood or affect. No depression or anxiety.  No memory loss.         Objective:    Physical Exam  GEN: A/Ox3; pleasant , NAD, obese   HEENT:  [place]/AT,  EACs-clear, TMs-wnl, NOSE-clear, THROAT-clear, no lesions, no postnasal drip or exudate noted.   NECK:  Supple w/ fair ROM; no JVD; normal carotid impulses w/o bruits; no thyromegaly or nodules palpated; no lymphadenopathy.  RESP  Coarse BS  w/o, wheezes/ rales/ or rhonchi.no accessory muscle use, no dullness to percussion  CARD:  RRR, no m/r/g  + tr  peripheral edema, pulses intact, no cyanosis or clubbing.  GI:   Soft & nt; nml bowel sounds; no organomegaly or masses detected.  Musco: Warm bil, no deformities or joint swelling noted.   Neuro: alert, no focal deficits noted.    Skin: Warm, no lesions or rashes         Assessment & Plan:

## 2010-09-07 ENCOUNTER — Other Ambulatory Visit: Payer: Self-pay | Admitting: *Deleted

## 2010-09-07 MED ORDER — ATORVASTATIN CALCIUM 40 MG PO TABS: 40.0000 mg | ORAL_TABLET | Freq: Every day | ORAL | Status: DC

## 2010-09-25 ENCOUNTER — Ambulatory Visit (INDEPENDENT_AMBULATORY_CARE_PROVIDER_SITE_OTHER): Payer: Medicare Other | Admitting: Endocrinology

## 2010-09-25 ENCOUNTER — Encounter: Payer: Self-pay | Admitting: Endocrinology

## 2010-09-25 ENCOUNTER — Other Ambulatory Visit (INDEPENDENT_AMBULATORY_CARE_PROVIDER_SITE_OTHER): Payer: Medicare Other

## 2010-09-25 VITALS — BP 126/70 | HR 86 | Temp 99.1°F | Ht 64.5 in | Wt 274.2 lb

## 2010-09-25 DIAGNOSIS — E119 Type 2 diabetes mellitus without complications: Secondary | ICD-10-CM

## 2010-09-25 DIAGNOSIS — M79673 Pain in unspecified foot: Secondary | ICD-10-CM

## 2010-09-25 DIAGNOSIS — M79609 Pain in unspecified limb: Secondary | ICD-10-CM

## 2010-09-25 LAB — HEMOGLOBIN A1C: Hgb A1c MFr Bld: 9.7 % — ABNORMAL HIGH (ref 4.6–6.5)

## 2010-09-25 MED ORDER — INSULIN GLARGINE 100 UNIT/ML ~~LOC~~ SOLN: 50.0000 [IU] | SUBCUTANEOUS | Status: DC

## 2010-09-25 NOTE — Patient Instructions (Addendum)
blood tests are being ordered for you today.  please call [phone] to hear your test results.  You will be prompted to enter the 9-digit "MRN" number that appears at the top left of this page, followed by #.  Then you will hear the message. pending the test results, please change your insulin to lantus, 50 units each morning.  i have sent a prescription to your pharmacy. check your blood sugar 2 times a day.  vary the time of day when you check, between before the 3 meals, and at bedtime.  also check if you have symptoms of your blood sugar being too high or too low.  please keep a record of the readings and bring it to your next appointment here.  please call us sooner if you are having low blood sugar episodes. Please make a "medicare wellness" appointment in 3 months.   i have requested for you a circulation test.  you will receive a phone call, about a day and time for an appointment.  If this test is abnormal, i would be happy to fill out the diabetes shoes form for you.

## 2010-09-25 NOTE — Progress Notes (Signed)
Subjective:    Patient ID: Joann Gutierrez, female    DOB: [birth year], 65 y.o.   MRN: [id number]  HPI Pt says she never misses the insulin.  no cbg record, but states cbg's are sometimes low in the afternoon, if she eats a smaller-than-expected lunch.  She says she can now take a more expensive insulin, now that she has help with her rx costs. Pt states few years of moderate pain at the feet, not necessarily in the context of exertion.  She reports assoc numbness.  She feels very strongly that she should have diabetic shoes, although she doesn't appear to qualify.  Past Medical History  Diagnosis Date  . Allergy   . Depression   . Diabetes mellitus 1997    Type II   . GERD (gastroesophageal reflux disease)   . Hypertension   . Hyperlipidemia   . Osteoarthritis   . Osteoporosis   . Hair loss   . Carpal tunnel syndrome on left   . Anemia   . CAD (coronary artery disease)     Mild very minimal coronary disease with 20% obtuse marginal stenosis  . CVA (cerebral infarction)   . Diverticulosis   . Esophageal stricture   . Gastritis   . Adenomatous colon polyp   . Sickle cell trait   . Asthma     pt has cpap w/ her today  . COPD (chronic obstructive pulmonary disease)   . 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 Gastroparesis 08/21/2007  . RENAL INSUFFICIENCY 02/16/2009  . OSTEOARTHRITIS 08/09/2006  . Carpal tunnel syndrome, left   . Morbid obesity   . Hernia, hiatal     Past Surgical History  Procedure Date  . Abdominal hysterectomy   . Replacement total knee 1998    Left  . Esophagogastroduodenoscopy 06/25/2006  . Breast lumpectomy     benign  . Erd 08/08/2000    History   Social History  . Marital Status: Widowed    Spouse Name: N/A    Number of Children: 1  . Years of Education: N/A   Occupational History  . Disabled    Social History Main Topics  . Smoking status: Former Smoker -- 0.5 packs/day    Quit date: 04/18/1980   . Smokeless tobacco: Not on file  . Alcohol Use: No  . Drug Use: No  . Sexually Active: Not on file   Other Topics Concern  . Not on file   Social History Narrative   Previously worked as a Electrical engineer.Daily Caffeine Use-Coffee and Tea    Current Outpatient Prescriptions on File Prior to Visit  Medication Sig Dispense Refill  . albuterol (VENTOLIN HFA) 108 (90 BASE) MCG/ACT inhaler Inhale 2 puffs into the lungs every 6 (six) hours as needed.        Marland Kitchen amLODipine (NORVASC) 5 MG tablet Take 1 tablet by mouth daily.      Marland Kitchen aspirin 81 MG tablet Take 81 mg by mouth daily.        Marland Kitchen atorvastatin (LIPITOR) 40 MG tablet Take 1 tablet (40 mg total) by mouth daily.  30 tablet  6  . budesonide-formoterol (SYMBICORT) 160-4.5 MCG/ACT inhaler Inhale 2 puffs into the lungs 2 (two) times daily.  1 Inhaler  6  . Calcium Carbonate-Vit D-Min (CALTRATE PLUS PO) Take 600 mg by mouth daily.        . citalopram (CELEXA) 20 MG tablet Take 20 mg by mouth daily.        Marland Kitchen  cyclobenzaprine (FLEXERIL) 10 MG tablet Take 1 tablet by mouth daily.      Marland Kitchen dicyclomine (BENTYL) 10 MG capsule Take 1 tablet by mouth Three times a day before meals.      Marland Kitchen diltiazem (CARDIZEM CD) 240 MG 24 hr capsule Take 240 mg by mouth daily.        Marland Kitchen donepezil (ARICEPT) 5 MG tablet Take 1 tablet by mouth daily.      Marland Kitchen ezetimibe (ZETIA) 10 MG tablet Take 10 mg by mouth daily.        . fluconazole (DIFLUCAN) 100 MG tablet as directed.      . fluocinonide (LIDEX) 0.05 % gel Apply topically 2 (two) times daily.        . fluticasone (FLONASE) 50 MCG/ACT nasal spray Place 2 sprays into the nose daily.  16 g  11  . furosemide (LASIX) 80 MG tablet Take 80 mg by mouth 2 (two) times daily.        Marland Kitchen gabapentin (NEURONTIN) 300 MG capsule Take 300 mg by mouth 2 (two) times daily.       . hydrALAZINE (APRESOLINE) 10 MG tablet Take 10 mg by mouth 3 (three) times daily.        . Insulin Syringe-Needle U-100 (B-D INS SYR ULTRAFINE 1CC/31G) 31G X 5/16" 1  ML MISC Use as directecd to inject insulin  100 each  5  . lidocaine (LIDODERM) 5 % Place 1 patch onto the skin daily. Remove & Discard patch within 12 hours or as directed by MD       . losartan-hydrochlorothiazide (HYZAAR) 100-25 MG per tablet Take 1 tablet by mouth daily.      . meloxicam (MOBIC) 15 MG tablet Take 1 tablet by mouth as needed.      . metFORMIN (GLUCOPHAGE) 500 MG tablet Take 1 tablet by mouth Twice daily.      . metoCLOPramide (REGLAN) 10 MG tablet Take 10 mg by mouth 4 (four) times daily.        . metoprolol (LOPRESSOR) 50 MG tablet Take 50 mg by mouth daily.       . Multiple Vitamin (MULTIVITAMIN PO) Take 1 tablet by mouth daily.        Marland Kitchen omeprazole (PRILOSEC) 40 MG capsule Take 40 mg by mouth every 30 (thirty) minutes. Before meals         . potassium chloride SA (K-DUR,KLOR-CON) 20 MEQ tablet Take 40 mEq by mouth 2 (two) times daily.        . raloxifene (EVISTA) 60 MG tablet Take 60 mg by mouth daily.        Marland Kitchen SINGULAIR 10 MG tablet Take 1 tablet by mouth daily.      . TraMADol HCl 50 MG TBSO Take 1 tablet by mouth 4 (four) times daily as needed.        . zafirlukast (ACCOLATE) 20 MG tablet Take 20 mg by mouth 2 (two) times daily.         Current Facility-Administered Medications on File Prior to Visit  Medication Dose Route Frequency Provider Last Rate Last Dose  . 0.9 %  sodium chloride infusion  500 mL Intravenous Continuous Inda Castle, MD      . dextrose 5 % solution   Intravenous Continuous Inda Castle, MD        Allergies  Allergen Reactions  . Promethazine Hcl     Family History  Problem Relation Age of Onset  . Colon cancer Brother   .  Cancer Brother     Colon Cancer  . Cancer Mother     Liver Cancer  . Diabetes Mother   . Kidney disease Mother   . Heart disease Mother     age 41's  . Heart disease Father   . Heart attack Father     died of MI when pt was 26  . Sickle cell anemia Father   . Diabetes Sister   . Kidney disease Sister     . Heart disease Sister     age 25's  . Allergies Sister   . Diabetes Sister   . Kidney disease Sister   . Heart disease Sister     age 79's   BP 126/70  Pulse 86  Temp(Src) 99.1 F (37.3 C) (Oral)  Ht 5' 4.5" (1.638 m)  Wt 274 lb 3.2 oz (124.376 kg)  BMI 46.34 kg/m2  SpO2 94%  Review of Systems Denies loc and weight change.      Objective:   Physical Exam Pulses: dorsalis pedis intact bilat.   Feet: no deformity.  no ulcer on the feet.  feet are of normal color and temp.  no edema Neuro: sensation is intact to touch on the feet     Assessment & Plan:  Foot pain, new, uncertain etiology.  Pt is advised there are medcicare rules which govern the prescribing of diabetic shoes, and we will abide by them. Dm, therapy limited by noncompliance with cbg's.  She seems to do best with a simple regimen.  i'll do the best i can.

## 2010-09-27 IMAGING — MR MR HEAD WO/W CM
7 of 10 series · 27 of 48 positions shown · IV contrast (Yes)
Comparison: 01/19/2004

CLINICAL DATA: Headache.  Syncope.  Blurred vision.

MRI HEAD WITHOUT AND WITH CONTRAST
TECHNIQUE: Multiplanar, multiecho pulse sequences of the brain and
surrounding structures were obtained according to standard protocol
without and with intravenous contrast
Contrast: 10 ml Multihance

[Series 3: T1 · sagittal · 5.0mm · 0.47mm/px · 4 of 24 slices shown]
[im 1/24]
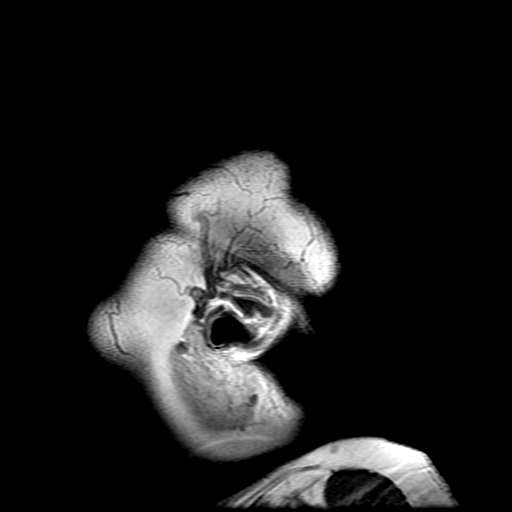
[im 8/24]
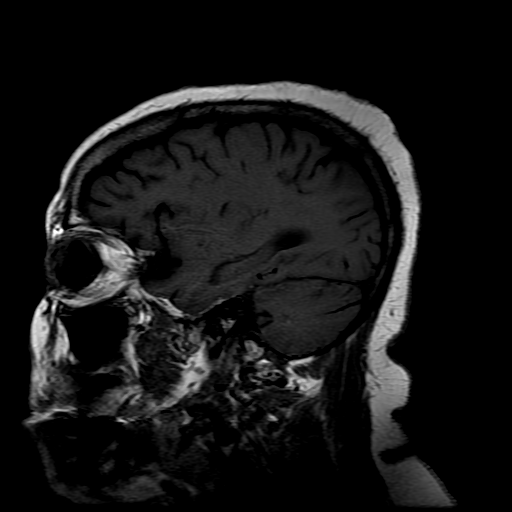
[im 16/24]
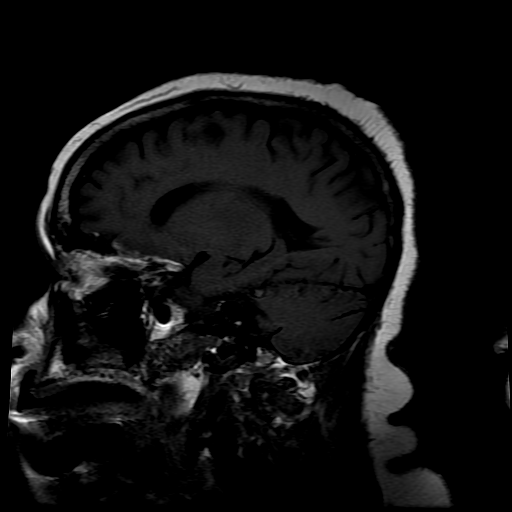
[im 24/24]
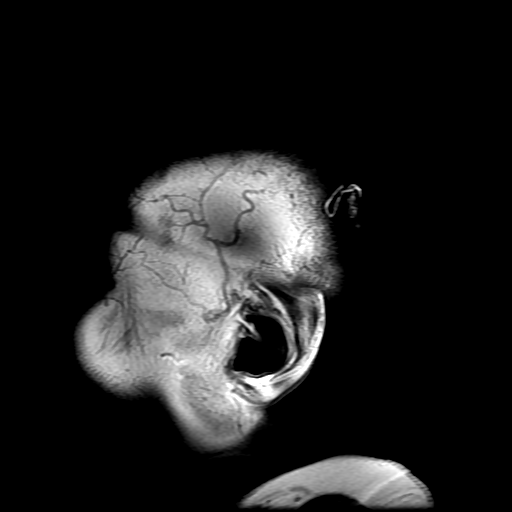

[Series 4: DWI · axial · 5.0mm · 1.09mm/px · z∈[-68,+77]mm · 8 of 60 slices shown (1 of 2)]
[im 1/60]
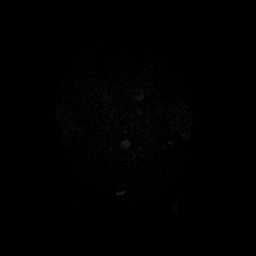
[im 7/60]
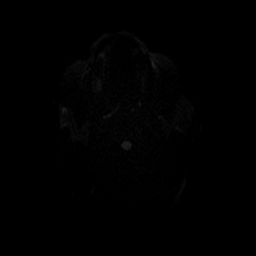
[im 20/60]
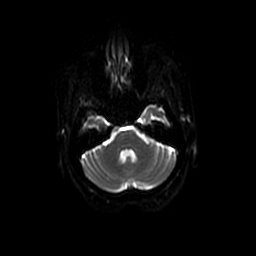
[im 27/60]
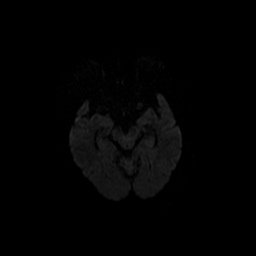
[im 33/60]
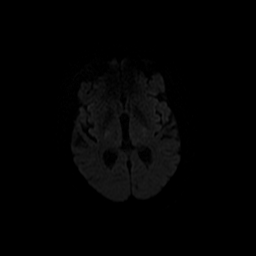
[im 40/60]
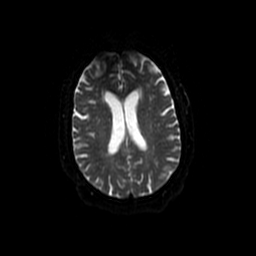
[im 53/60]
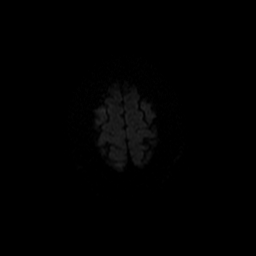
[im 60/60]
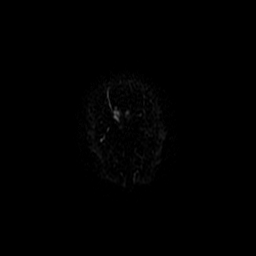

[Series 5: T2 · axial · 5.0mm · 0.43mm/px · z∈[-67,+76]mm · 3 of 23 slices shown]
[im 1/23]
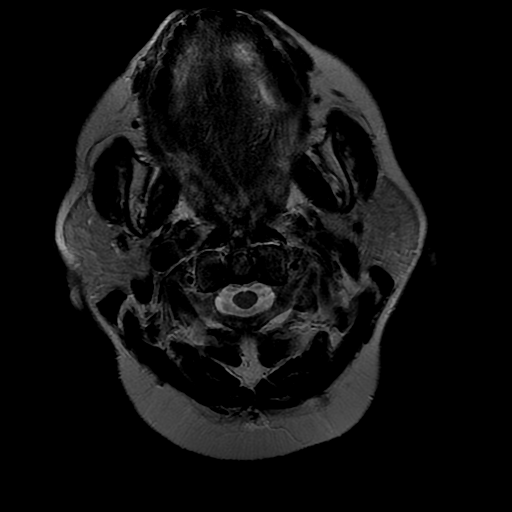
[im 12/23]
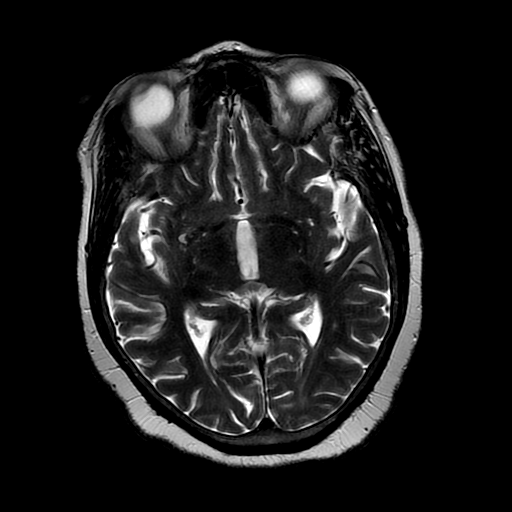
[im 23/23]
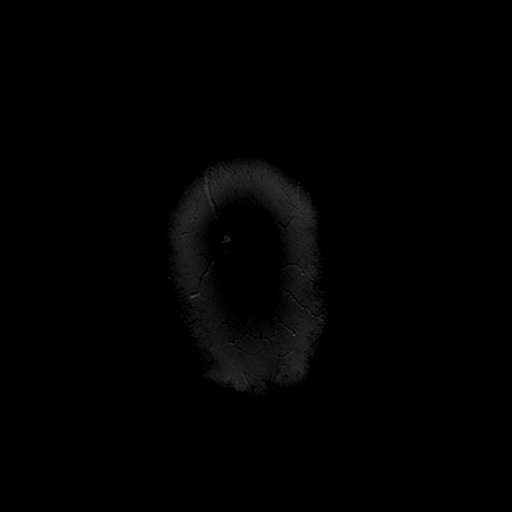

[Series 6: FLAIR · axial · 5.0mm · 0.43mm/px · z∈[-72,+82]mm · 3 of 23 slices shown]
[im 1/23]
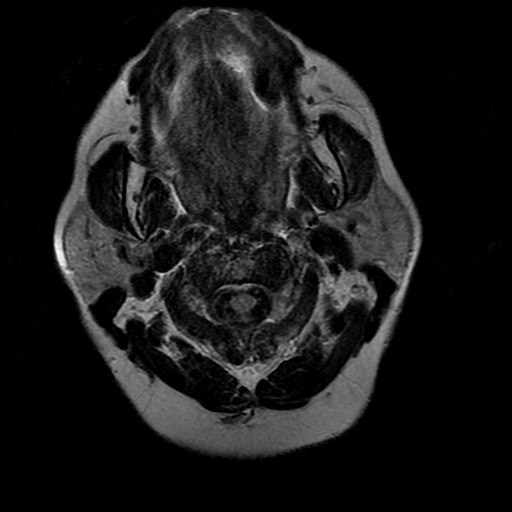
[im 12/23]
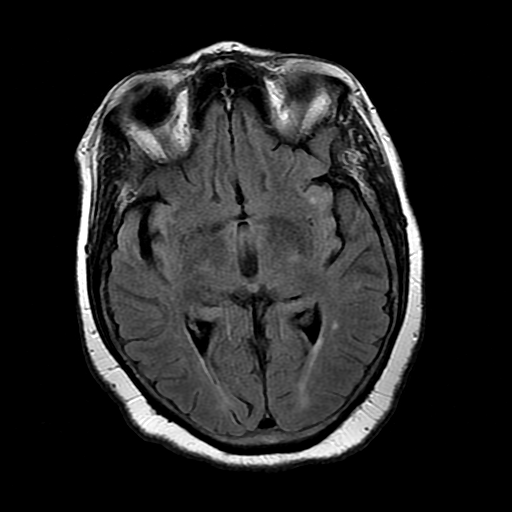
[im 23/23]
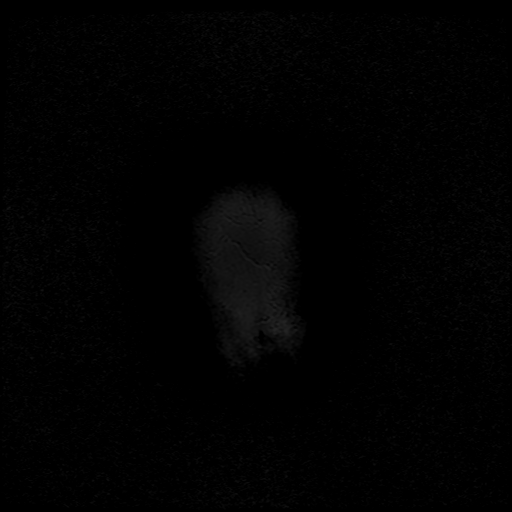

[Series 9: T2 post-contrast · coronal · 5.0mm · 0.45mm/px · 3 of 24 slices shown]
[im 1/24]
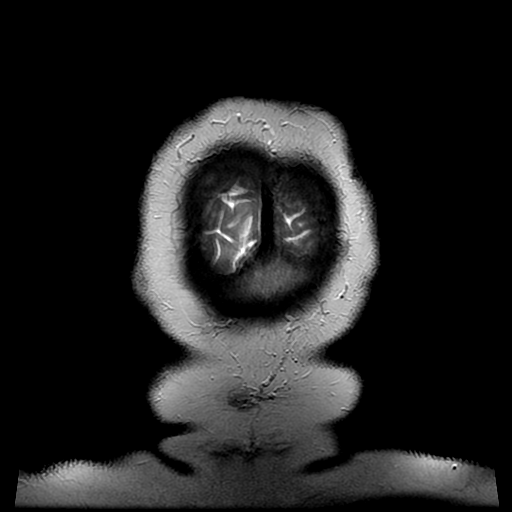
[im 12/24]
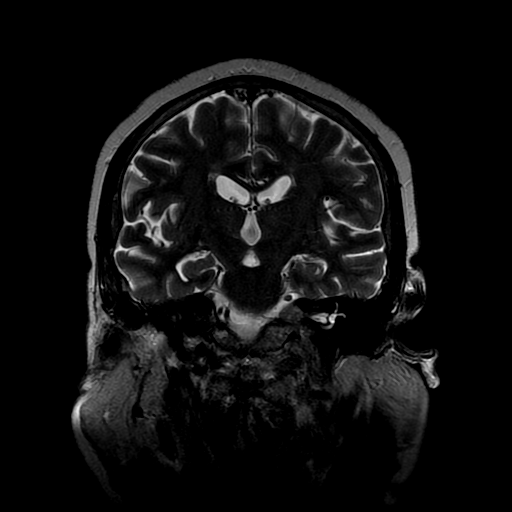
[im 24/24]
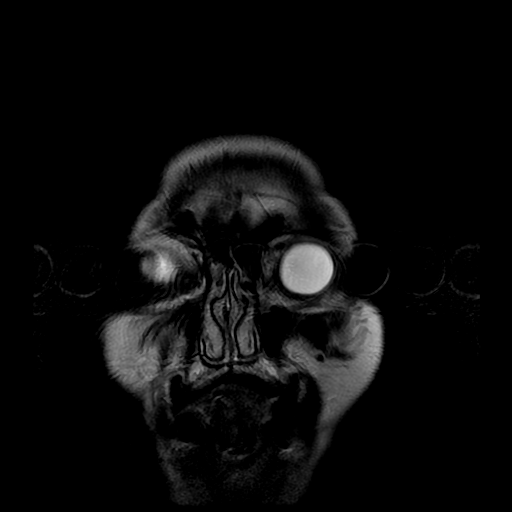

[Series 11: T1 post-contrast · coronal · 5.0mm · 0.45mm/px · 2 of 17 slices shown]
[im 1/17]
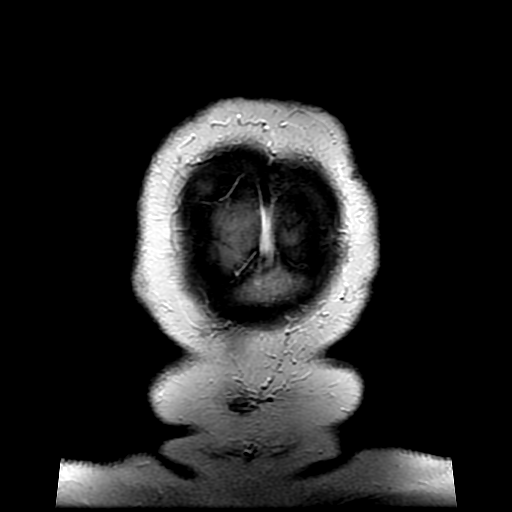
[im 17/17]
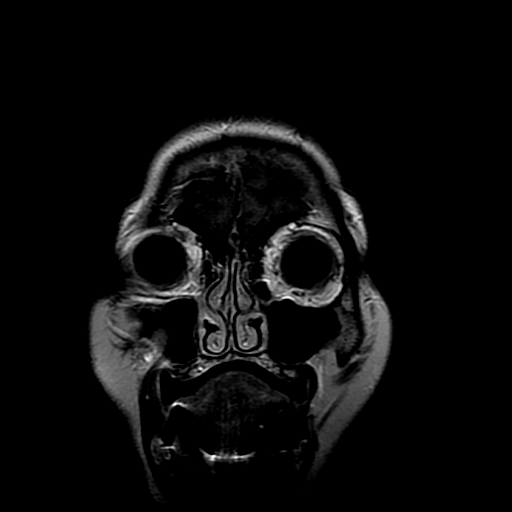

[Series 400: DWI · axial · 5.0mm · 1.09mm/px · z∈[-68,+77]mm · 4 of 30 slices shown (2 of 2)]
[im 1/30]
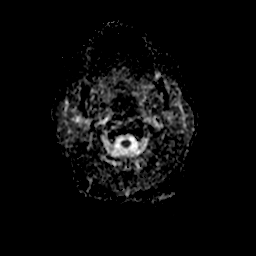
[im 10/30]
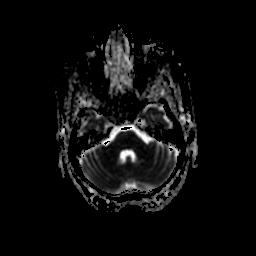
[im 20/30]
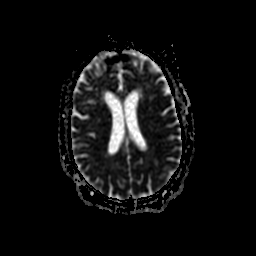
[im 30/30]
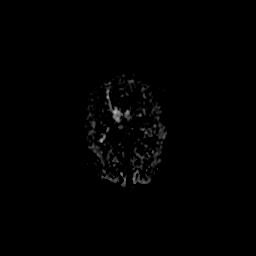

[27 of 48 positions shown; findings below may reference images not displayed]

FINDINGS: Diffusion imaging does not show any acute or subacute
infarction.  The brainstem is normal.  There are old small vessel
infarctions within the cerebellum.  There are chronic appearing
small vessel changes affecting the deep and subcortical white
matter both cerebral hemispheres.  These are somewhat progressive
since 5882.  No cortical or large vessel territory infarction.  No
mass lesion, hemorrhage, hydrocephalus or extra-axial collection.
No pituitary mass.  No inflammatory sinus disease.  After contrast
administration, no abnormal enhancement occurs.  No skull or skull
base abnormality is seen.
IMPRESSION: No acute or reversible findings.  Atrophy and chronic small vessel
disease affecting the hemispheric white matter, somewhat
progressive since [DATE].

## 2010-10-11 ENCOUNTER — Telehealth: Payer: Self-pay | Admitting: Gastroenterology

## 2010-10-11 NOTE — Telephone Encounter (Signed)
Pt states she is having problems with abdominal pain and bloating. Pt scheduled to see Tye Savoy NP 10/12/10@10 :30am. Pt aware of appt date and time.

## 2010-10-12 ENCOUNTER — Encounter: Payer: Self-pay | Admitting: Nurse Practitioner

## 2010-10-12 ENCOUNTER — Telehealth: Payer: Self-pay | Admitting: Gastroenterology

## 2010-10-12 ENCOUNTER — Ambulatory Visit (INDEPENDENT_AMBULATORY_CARE_PROVIDER_SITE_OTHER): Payer: Medicare Other | Admitting: Nurse Practitioner

## 2010-10-12 DIAGNOSIS — R140 Abdominal distension (gaseous): Secondary | ICD-10-CM

## 2010-10-12 DIAGNOSIS — K3189 Other diseases of stomach and duodenum: Secondary | ICD-10-CM

## 2010-10-12 DIAGNOSIS — R1013 Epigastric pain: Secondary | ICD-10-CM

## 2010-10-12 DIAGNOSIS — K3184 Gastroparesis: Secondary | ICD-10-CM

## 2010-10-12 DIAGNOSIS — R1319 Other dysphagia: Secondary | ICD-10-CM

## 2010-10-12 DIAGNOSIS — R141 Gas pain: Secondary | ICD-10-CM

## 2010-10-12 MED ORDER — SUCRALFATE 1 GM/10ML PO SUSP: ORAL | Status: DC

## 2010-10-12 NOTE — Telephone Encounter (Signed)
Pt scheduled for follow-up appt with Dr. Deatra Ina per Tye Savoy NP. Pt scheduled to see Dr. Deatra Ina 10/30/10@10 :30am. Pt aware of appt date and time.

## 2010-10-12 NOTE — Patient Instructions (Signed)
Take Align capsules for 2 weeks, 1 capsule daily, samples given.   Stop the Aleve. Increase the Prilosec to twice daily. Call our office and make an appointment to see Dr Deatra Ina or Tye Savoy ACNP. For the week of 10-29-2010.

## 2010-10-12 NOTE — Progress Notes (Signed)
MARYELLA SIEGMUND [id number] 1945-07-03   HISTORY OR PRESENT ILLNESS : Joann Gutierrez is a 65 year old female with multiple medical problems including, but not limited to: diabetes, hypertension, hyperlipidemia, renal insufficiency, morbid obesity and COPD. She is known to Dr. Deatra Ina for personal history of colon polyps, family history colorectal cancer in brother and recurrent esophageal strictures. She has undergone several upper endoscopies with dilations through the years, her last one being July of this year. Patient comes in with a friend for evaluation of bloating, upper abdominal discomfort, nausea. She is taking Reglan before meals. Patient believes she is taking Bentyl as well. Patient complains of phlegm in throat. Her upper abdominal discomfort is present on a daily basis, Aleve helps, she takes it several times a week. She takes daily Mobic as well. Bowel movements are normal.   Current Medications, Allergies, Past Medical History, Past Surgical History, Family History and Social History were reviewed in [employer] record.  PHYSICAL EXAMINATION : General: Pleasant, obese, black female in no acute distress Head: Normocephalic and atraumatic Eyes:  sclerae anicteric,conjunctive pink. Ears: Normal auditory acuity Mouth: No deformity or lesions Neck: Supple, no masses.  Lungs: Decreased breath sounds bilateral bases. No wheezes. Heart: Regular rate and rhythm Abdomen: Soft, obese, mild epigastric tenderness, active bowel sounds. No obvious masses or hepatomegaly noted.  Rectal: not done Musculoskeletal: Symmetrical with no gross deformities  Skin: No lesions on visible extremities Extremities: No edema or deformities noted Neurological: Alert oriented x 4, grossly nonfocal Cervical Nodes:  No significant cervical adenopathy Psychological:  Alert and cooperative. Normal mood and affect  ASSESSMENT AND PLAN :

## 2010-10-16 ENCOUNTER — Encounter: Payer: Self-pay | Admitting: Pulmonary Disease

## 2010-10-16 ENCOUNTER — Encounter: Payer: Self-pay | Admitting: Nurse Practitioner

## 2010-10-16 ENCOUNTER — Ambulatory Visit (INDEPENDENT_AMBULATORY_CARE_PROVIDER_SITE_OTHER): Payer: Medicare Other | Admitting: Pulmonary Disease

## 2010-10-16 DIAGNOSIS — J309 Allergic rhinitis, unspecified: Secondary | ICD-10-CM

## 2010-10-16 DIAGNOSIS — J449 Chronic obstructive pulmonary disease, unspecified: Secondary | ICD-10-CM

## 2010-10-16 DIAGNOSIS — Z23 Encounter for immunization: Secondary | ICD-10-CM

## 2010-10-16 DIAGNOSIS — R140 Abdominal distension (gaseous): Secondary | ICD-10-CM | POA: Insufficient documentation

## 2010-10-16 DIAGNOSIS — G4733 Obstructive sleep apnea (adult) (pediatric): Secondary | ICD-10-CM

## 2010-10-16 DIAGNOSIS — R1013 Epigastric pain: Secondary | ICD-10-CM | POA: Insufficient documentation

## 2010-10-16 MED ORDER — MOMETASONE FURO-FORMOTEROL FUM 100-5 MCG/ACT IN AERO: 2.0000 | INHALATION_SPRAY | Freq: Two times a day (BID) | RESPIRATORY_TRACT | Status: DC

## 2010-10-16 NOTE — Progress Notes (Signed)
Subjective:    Patient ID: Joann Gutierrez, female    DOB: 09-16-1945, 65 y.o.   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  Her main difficulty at present is phlegm in her throat.  This happens mostly after she uses symbicort.  She will occasionally bring up white sputum.  She is not having much wheeze.  She is not very active.  She gets winded easily with activity, that quickly resolves when she rests.  She is using ventolin about 2 times per day, and this helps some.  She is not having any trouble with her CPAP machine.  She still has some sinus congestion, but is using flonase.  Past Medical History  Diagnosis Date  . Allergy   . Depression   . Diabetes mellitus 1997    Type II   . GERD (gastroesophageal reflux disease)   . Hypertension   . Hyperlipidemia   . Osteoarthritis   . Osteoporosis   . Hair loss   . Carpal tunnel syndrome on left   . Anemia   . CAD (coronary artery disease)     Mild very minimal coronary disease with 20% obtuse marginal stenosis  . CVA (cerebral infarction)   . Diverticulosis   . Esophageal stricture   . Gastritis   . Adenomatous colon polyp   . Sickle cell trait   . Asthma     pt has cpap w/ her today  . COPD (chronic obstructive pulmonary disease)   . 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 Gastroparesis 08/21/2007  . RENAL INSUFFICIENCY 02/16/2009  . OSTEOARTHRITIS 08/09/2006  . Carpal tunnel syndrome, left   . Morbid obesity   . Hernia, hiatal     Family History  Problem Relation Age of Onset  . Colon cancer Brother   . Cancer Brother     Colon Cancer  . Cancer Mother     Liver Cancer  . Diabetes Mother   . Kidney disease Mother   . Heart disease Mother     age 56's  . Heart disease Father   . Heart attack Father     died of MI when pt was 37  . Sickle cell  anemia Father   . Diabetes Sister   . Kidney disease Sister   . Heart disease Sister     age 82's  . Allergies Sister   . Diabetes Sister   . Kidney disease Sister   . Heart disease Sister     age 27's    History   Social History  . Marital Status: Widowed    Number of Children: 1   Occupational History  . Disabled    Social History Main Topics  . Smoking status: Former Smoker -- 0.5 packs/day    Quit date: 04/18/1980  . Smokeless tobacco: Never Used  . Alcohol Use: No  . Drug Use: No   Social History Narrative   Previously worked as a Electrical engineer.Daily Caffeine Use-Coffee and Tea    Allergies  Allergen Reactions  . Promethazine Hcl     Review of Systems     Objective:   Physical Exam  BP 138/82  Pulse 86  Temp(Src) 98.5 F (36.9 C) (Oral)  Ht 5\' 4"  (1.626 m)  Wt 277 lb 12.8 oz (126.009 kg)  BMI 47.68 kg/m2  SpO2 97%  General - obese, dyspneic  HEENT - clear nasal discharge, no oral exudate, no LAN  Chest - b/l wheeze with scattered rhonchi  Cardiac - s1s2 regular, no murmur  Abd - soft, nontender  Ext - minimal ankle edema  Neuro - normal strength, CN intact  Psych - anxious     Assessment & Plan:   Chronic obstructive asthma Most of her current symptoms are related to her upper airway.  Will see if changing from symbicort to dulera will improve this.  Will start her with 200 strength dulera (gave sample), and then change to 100 strength after sample finished.  She is to call if she is still having trouble, and then will need to consider changing to nebulizer.  Will arrange for pulmonary rehab.  Will give influenza vaccine today.  ALLERGIC RHINITIS She is to continue her sinus regimen.  OBSTRUCTIVE SLEEP APNEA She is compliant with therapy and reports benefit.  Will continue current set up.    Updated Medication List Outpatient Encounter Prescriptions as of 10/16/2010  Medication Sig Dispense Refill  . albuterol (VENTOLIN HFA) 108 (90  BASE) MCG/ACT inhaler Inhale 2 puffs into the lungs every 6 (six) hours as needed.        Marland Kitchen amLODipine (NORVASC) 5 MG tablet Take 1 tablet by mouth daily.      Marland Kitchen aspirin 81 MG tablet Take 81 mg by mouth daily.        Marland Kitchen atorvastatin (LIPITOR) 40 MG tablet Take 1 tablet (40 mg total) by mouth daily.  30 tablet  6  . Calcium Carbonate-Vit D-Min (CALTRATE PLUS PO) Take 600 mg by mouth daily.        . citalopram (CELEXA) 20 MG tablet Take 20 mg by mouth daily.        . cyclobenzaprine (FLEXERIL) 10 MG tablet Take 1 tablet by mouth daily.      Marland Kitchen dicyclomine (BENTYL) 10 MG capsule Take 1 tablet by mouth Three times a day before meals.      Marland Kitchen diltiazem (CARDIZEM CD) 240 MG 24 hr capsule Take 240 mg by mouth daily.        Marland Kitchen donepezil (ARICEPT) 5 MG tablet Take 1 tablet by mouth daily.      Marland Kitchen ezetimibe (ZETIA) 10 MG tablet Take 10 mg by mouth daily.        . fluconazole (DIFLUCAN) 100 MG tablet as directed.      . fluocinonide (LIDEX) 0.05 % gel Apply topically 2 (two) times daily.        . fluticasone (FLONASE) 50 MCG/ACT nasal spray Place 2 sprays into the nose daily.  16 g  11  . furosemide (LASIX) 80 MG tablet Take 80 mg by mouth 2 (two) times daily.        Marland Kitchen gabapentin (NEURONTIN) 300 MG capsule Take 300 mg by mouth 2 (two) times daily.       . hydrALAZINE (APRESOLINE) 10 MG tablet Take 10 mg by mouth 3 (three) times daily.        . Insulin Syringe-Needle U-100 (B-D INS SYR ULTRAFINE 1CC/31G) 31G X 5/16" 1 ML MISC Use as directecd to inject insulin  100 each  5  . losartan-hydrochlorothiazide (HYZAAR) 100-25 MG per tablet Take 1 tablet by mouth daily.      . meloxicam (MOBIC) 15 MG tablet Take 1 tablet by mouth as needed.      . metFORMIN (GLUCOPHAGE) 500 MG tablet Take 1 tablet by mouth Twice daily.      Marland Kitchen  metoCLOPramide (REGLAN) 10 MG tablet Take 10 mg by mouth 4 (four) times daily.        . metoprolol (LOPRESSOR) 50 MG tablet Take 50 mg by mouth daily.       . Multiple Vitamin (MULTIVITAMIN PO)  Take 1 tablet by mouth daily.        Marland Kitchen omeprazole (PRILOSEC) 40 MG capsule Take 40 mg by mouth every 30 (thirty) minutes. Once daily.       . potassium chloride SA (K-DUR,KLOR-CON) 20 MEQ tablet Take 40 mEq by mouth 2 (two) times daily.        . raloxifene (EVISTA) 60 MG tablet Take 60 mg by mouth daily.        Marland Kitchen SINGULAIR 10 MG tablet Take 1 tablet by mouth daily.      . sucralfate (CARAFATE) 1 GM/10ML suspension Take 1 gram twice daily  420 mL  1  . TraMADol HCl 50 MG TBSO Take 1 tablet by mouth 4 (four) times daily as needed.        . zafirlukast (ACCOLATE) 20 MG tablet Take 20 mg by mouth 2 (two) times daily.        Marland Kitchen DISCONTD: budesonide-formoterol (SYMBICORT) 160-4.5 MCG/ACT inhaler Inhale 2 puffs into the lungs 2 (two) times daily.  1 Inhaler  6  . insulin glargine (LANTUS SOLOSTAR) 100 UNIT/ML injection Inject 50 Units into the skin every morning. And pen needles 1/day  30 mL  12  . mometasone-formoterol (DULERA) 100-5 MCG/ACT AERO Inhale 2 puffs into the lungs 2 (two) times daily.  1 Inhaler  5  . DISCONTD: lidocaine (LIDODERM) 5 % Place 1 patch onto the skin daily. Remove & Discard patch within 12 hours or as directed by MD        Facility-Administered Encounter Medications as of 10/16/2010  Medication Dose Route Frequency Provider Last Rate Last Dose  . 0.9 %  sodium chloride infusion  500 mL Intravenous Continuous Inda Castle, MD      . dextrose 5 % solution   Intravenous Continuous Inda Castle, MD

## 2010-10-16 NOTE — Progress Notes (Signed)
Addended by: Ailene Ards R on: 10/16/2010 02:14 PM   Modules accepted: Orders

## 2010-10-16 NOTE — Assessment & Plan Note (Signed)
She is to continue her sinus regimen.

## 2010-10-16 NOTE — Assessment & Plan Note (Addendum)
Epigastric discomfort associated with bloating and nausea. Patient has a history of gastroparesis but is taking Reglan before meals so that should help with gastric emptying. This could be PUD or NSAID related gastritis as she takes Mobic, Aleve and a baby aspirin. Recommend Carafate, just twice daily given history of renal insufficiency. Refrain from using Aleve for time being. Will increase PPI to twice daily. Two week trial of Align for bloating. Return for follow up appointment in two weeks.

## 2010-10-16 NOTE — Patient Instructions (Signed)
Stop symbicort Dulera two puffs twice per day, and rinse mouth after using Will arrange for pulmonary rehab Flu shot today Follow up in 4 to 6 months

## 2010-10-16 NOTE — Assessment & Plan Note (Signed)
She is compliant with therapy and reports benefit.  Will continue current set up.

## 2010-10-16 NOTE — Assessment & Plan Note (Signed)
Most of her current symptoms are related to her upper airway.  Will see if changing from symbicort to dulera will improve this.  Will start her with 200 strength dulera (gave sample), and then change to 100 strength after sample finished.  She is to call if she is still having trouble, and then will need to consider changing to nebulizer.  Will arrange for pulmonary rehab.  Will give influenza vaccine today.

## 2010-10-18 NOTE — Progress Notes (Signed)
Reviewed and agree with management. Byard Carranza D. Cleophas Yoak, M.D., FACG  

## 2010-10-19 ENCOUNTER — Other Ambulatory Visit: Payer: Self-pay | Admitting: Nurse Practitioner

## 2010-10-24 NOTE — Telephone Encounter (Signed)
I called and left a message for the pt to call me.  I needed to know if she get her prescription for Carafate. I asked her to please call us.

## 2010-10-25 ENCOUNTER — Telehealth: Payer: Self-pay | Admitting: Nurse Practitioner

## 2010-10-26 NOTE — Telephone Encounter (Signed)
I called [facility] in [place] and they said the ins co will not cover the Carafate.  The pt asked me about prevpack. S he said that worked well before.  I told her Tye Savoy ACNP is out of town and I cannot authorize that.  I called the pt back and told her we will call her on Monday after I speak to [place] .

## 2010-10-30 ENCOUNTER — Ambulatory Visit (INDEPENDENT_AMBULATORY_CARE_PROVIDER_SITE_OTHER): Payer: Medicare Other | Admitting: Gastroenterology

## 2010-10-30 ENCOUNTER — Encounter: Payer: Self-pay | Admitting: Gastroenterology

## 2010-10-30 DIAGNOSIS — Z800 Family history of malignant neoplasm of digestive organs: Secondary | ICD-10-CM | POA: Insufficient documentation

## 2010-10-30 DIAGNOSIS — R1013 Epigastric pain: Secondary | ICD-10-CM

## 2010-10-30 DIAGNOSIS — K3184 Gastroparesis: Secondary | ICD-10-CM

## 2010-10-30 MED ORDER — PEG-KCL-NACL-NASULF-NA ASC-C 100 G PO SOLR: 1.0000 | Freq: Once | ORAL | Status: DC

## 2010-10-30 NOTE — Progress Notes (Signed)
History of Present Illness: Joann Gutierrez has returned for followup for dyspepsia. She complains of postprandial bloating and nausea. Although she was instructed to hold Mobic she continues to take this regularly. She denies dysphagia. 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  The patient has gastroparesis and remains on metoclopramide.  Family history is pertinent for brother with colon cancer. Last colonoscopy in 2007 showed diverticulosis only.    Review of Systems: Pertinent positive and negative review of systems were noted in the above HPI section. All other review of systems were otherwise negative.    Current Medications, Allergies, Past Medical History, Past Surgical History, Family History and Social History were reviewed in [place] [facility] record  Vital signs were reviewed in today's medical record. Physical Exam: General: Well developed , well nourished, no acute distress

## 2010-10-30 NOTE — Patient Instructions (Addendum)
Your Colonoscopy is scheduled on 11/27/2010 at 10:30am Your Gastric Emptying Scan is scheduled on 11/19/2010 at 1pm at [facility] Nothing to eat or drink 6 hours prior to your procedure No stomach medications 24 hours prior to your test You can expext to be there for 2-3 hours for each of these tests

## 2010-10-30 NOTE — Assessment & Plan Note (Signed)
Plan followup colonoscopy 

## 2010-10-30 NOTE — Assessment & Plan Note (Addendum)
Abdominal bloating and nausea suggest gastroparesis. NSAID use may be contributing.  Recommendations #1 hold Mobic #2 gastric emptying scan while on Reglan

## 2010-10-30 NOTE — Assessment & Plan Note (Signed)
Pain maybe related to her NSAID use. She was again instructed to hold these medicines

## 2010-11-19 ENCOUNTER — Encounter (HOSPITAL_COMMUNITY)
Admission: RE | Admit: 2010-11-19 | Discharge: 2010-11-19 | Disposition: A | Discharge status: Home / Self Care | Payer: Medicare Other | Source: Ambulatory Visit | Attending: Gastroenterology | Admitting: Gastroenterology

## 2010-11-19 DIAGNOSIS — Z800 Family history of malignant neoplasm of digestive organs: Secondary | ICD-10-CM

## 2010-11-19 DIAGNOSIS — R1013 Epigastric pain: Secondary | ICD-10-CM

## 2010-11-19 MED ORDER — TECHNETIUM TC 99M SULFUR COLLOID
2.0000 | Freq: Once | INTRAVENOUS | Status: AC | PRN
  Administered 2010-11-19: 2 via INTRAVENOUS

## 2010-11-20 ENCOUNTER — Telehealth: Payer: Self-pay

## 2010-11-20 NOTE — Progress Notes (Signed)
Quick Note:  Please inform the patient that the GES was normal and to continue current plan of action ______

## 2010-11-20 NOTE — Telephone Encounter (Signed)
Message copied by Faythe Casa on Tue Nov 20, 2010  1:48 PM ------      Message from: Erskine Emery D      Created: Tue Nov 20, 2010 12:07 PM       Please inform the patient that the GES was normal and to continue current plan of action

## 2010-11-20 NOTE — Telephone Encounter (Signed)
Pt aware.

## 2010-11-26 ENCOUNTER — Telehealth: Payer: Self-pay | Admitting: Gastroenterology

## 2010-11-26 NOTE — Telephone Encounter (Signed)
Called in prescription for Moviprep at [phone]. Notified patient.

## 2010-11-27 ENCOUNTER — Encounter: Payer: Self-pay | Admitting: Gastroenterology

## 2010-11-27 ENCOUNTER — Ambulatory Visit (AMBULATORY_SURGERY_CENTER): Payer: Medicare Other | Admitting: Gastroenterology

## 2010-11-27 DIAGNOSIS — Z1211 Encounter for screening for malignant neoplasm of colon: Secondary | ICD-10-CM

## 2010-11-27 DIAGNOSIS — D126 Benign neoplasm of colon, unspecified: Secondary | ICD-10-CM

## 2010-11-27 DIAGNOSIS — Z800 Family history of malignant neoplasm of digestive organs: Secondary | ICD-10-CM

## 2010-11-27 LAB — GLUCOSE, CAPILLARY: Glucose-Capillary: 229 mg/dL — ABNORMAL HIGH (ref 70–99)

## 2010-11-27 MED ORDER — SODIUM CHLORIDE 0.9 % IV SOLN: 500.0000 mL | INTRAVENOUS | Status: DC

## 2010-11-28 ENCOUNTER — Telehealth: Payer: Self-pay

## 2010-11-28 NOTE — Telephone Encounter (Signed)

## 2010-12-06 ENCOUNTER — Encounter: Payer: Self-pay | Admitting: *Deleted

## 2010-12-06 NOTE — Telephone Encounter (Signed)
A user error has taken place: encounter opened in error, closed for administrative reasons.

## 2010-12-25 ENCOUNTER — Encounter (INDEPENDENT_AMBULATORY_CARE_PROVIDER_SITE_OTHER): Payer: Self-pay | Admitting: Surgery

## 2010-12-25 ENCOUNTER — Ambulatory Visit (INDEPENDENT_AMBULATORY_CARE_PROVIDER_SITE_OTHER): Payer: Medicare Other | Admitting: Surgery

## 2010-12-25 VITALS — BP 152/94 | HR 60 | Temp 97.2°F | Resp 18 | Ht 64.0 in | Wt 271.4 lb

## 2010-12-25 DIAGNOSIS — R519 Headache, unspecified: Secondary | ICD-10-CM | POA: Insufficient documentation

## 2010-12-25 DIAGNOSIS — R51 Headache: Secondary | ICD-10-CM | POA: Insufficient documentation

## 2010-12-25 NOTE — Progress Notes (Signed)
CC: Headache HPI: This patient's primary physician asked me to see her to consider a temporal artery biopsy. She has had a history of myalgias osteoarthritis and some elevated inflammation markers. More recently she's been having headaches particularly on the left side and pain when she touches the left side of her head. There's been no other obvious abnormality noted and with the location of her pain and the elevated flap or markers the suggestion is that this might represent temporal arteritis   ROS: She has a history of diabetes hypertension asthma hypercholesterolemia chronic myalgias elevated ESR  MEDS: Current outpatient prescriptions:albuterol (VENTOLIN HFA) 108 (90 BASE) MCG/ACT inhaler, Inhale 2 puffs into the lungs every 6 (six) hours as needed.  , Disp: , Rfl: ;  amLODipine (NORVASC) 5 MG tablet, Take 1 tablet by mouth daily., Disp: , Rfl: ;  aspirin 81 MG tablet, Take 81 mg by mouth daily.  , Disp: , Rfl: ;  atorvastatin (LIPITOR) 40 MG tablet, Take 1 tablet (40 mg total) by mouth daily., Disp: 30 tablet, Rfl: 6 Calcium Carbonate-Vit D-Min (CALTRATE PLUS PO), Take 600 mg by mouth daily.  , Disp: , Rfl: ;  citalopram (CELEXA) 20 MG tablet, Take 20 mg by mouth daily.  , Disp: , Rfl: ;  cyclobenzaprine (FLEXERIL) 10 MG tablet, Take 1 tablet by mouth daily., Disp: , Rfl: ;  dicyclomine (BENTYL) 10 MG capsule, Take 1 tablet by mouth Three times a day before meals., Disp: , Rfl:  diltiazem (CARDIZEM CD) 240 MG 24 hr capsule, Take 240 mg by mouth daily.  , Disp: , Rfl: ;  donepezil (ARICEPT) 5 MG tablet, Take 1 tablet by mouth daily., Disp: , Rfl: ;  ezetimibe (ZETIA) 10 MG tablet, Take 10 mg by mouth daily.  , Disp: , Rfl: ;  fluconazole (DIFLUCAN) 100 MG tablet, as directed., Disp: , Rfl: ;  fluocinonide (LIDEX) 0.05 % gel, Apply topically 2 (two) times daily.  , Disp: , Rfl:  fluticasone (FLONASE) 50 MCG/ACT nasal spray, Place 2 sprays into the nose daily., Disp: 16 g, Rfl: 11;  furosemide  (LASIX) 80 MG tablet, Take 80 mg by mouth 2 (two) times daily.  , Disp: , Rfl: ;  gabapentin (NEURONTIN) 300 MG capsule, Take 300 mg by mouth 2 (two) times daily. , Disp: , Rfl: ;  HUMULIN N 100 UNIT/ML injection, , Disp: , Rfl: ;  hydrALAZINE (APRESOLINE) 10 MG tablet, Take 10 mg by mouth 3 (three) times daily.  , Disp: , Rfl:  insulin glargine (LANTUS SOLOSTAR) 100 UNIT/ML injection, Inject 50 Units into the skin every morning. And pen needles 1/day, Disp: 30 mL, Rfl: 12;  Insulin Syringe-Needle U-100 (B-D INS SYR ULTRAFINE 1CC/31G) 31G X 5/16" 1 ML MISC, Use as directecd to inject insulin, Disp: 100 each, Rfl: 5;  losartan-hydrochlorothiazide (HYZAAR) 100-25 MG per tablet, Take 1 tablet by mouth daily., Disp: , Rfl:  meloxicam (MOBIC) 15 MG tablet, Take 1 tablet by mouth as needed., Disp: , Rfl: ;  metFORMIN (GLUCOPHAGE) 500 MG tablet, Take 1 tablet by mouth Twice daily., Disp: , Rfl: ;  metoCLOPramide (REGLAN) 10 MG tablet, Take 10 mg by mouth 4 (four) times daily.  , Disp: , Rfl: ;  metoprolol (LOPRESSOR) 50 MG tablet, Take 50 mg by mouth daily. , Disp: , Rfl:  mometasone-formoterol (DULERA) 100-5 MCG/ACT AERO, Inhale 2 puffs into the lungs 2 (two) times daily., Disp: 1 Inhaler, Rfl: 5;  Multiple Vitamin (MULTIVITAMIN PO), Take 1 tablet by mouth daily.  ,  Disp: , Rfl: ;  omeprazole (PRILOSEC) 40 MG capsule, Take 40 mg by mouth every 30 (thirty) minutes. Once daily. , Disp: , Rfl: ;  PEG 3350-KCl-NaBcb-NaCl-NaSulf (PEG 3350/ELECTROLYTES) 240 G SOLR, , Disp: , Rfl:  potassium chloride SA (K-DUR,KLOR-CON) 20 MEQ tablet, Take 40 mEq by mouth 2 (two) times daily.  , Disp: , Rfl: ;  raloxifene (EVISTA) 60 MG tablet, Take 60 mg by mouth daily.  , Disp: , Rfl: ;  SINGULAIR 10 MG tablet, Take 1 tablet by mouth daily., Disp: , Rfl: ;  sucralfate (CARAFATE) 1 GM/10ML suspension, Take 1 gram twice daily, Disp: 420 mL, Rfl: 1;  SYMBICORT 160-4.5 MCG/ACT inhaler, , Disp: , Rfl:  TraMADol HCl 50 MG TBSO, Take 1 tablet by  mouth 4 (four) times daily as needed.  , Disp: , Rfl: ;  zafirlukast (ACCOLATE) 20 MG tablet, Take 20 mg by mouth 2 (two) times daily.  , Disp: , Rfl:  Current facility-administered medications:0.9 %  sodium chloride infusion, 500 mL, Intravenous, Continuous, Inda Castle, MD    ALLERGIES: Allergies  Allergen Reactions  . Promethazine Hcl        PE General: The patient is alert and oriented and healthy-appearing Head: Normocephalic she does have some tenderness over the left temporal artery area. I see no other abnormalities.  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 I told her that most of the time the sternum out to be negative and that removing a segment of the artery is only for diagnostic purposes I did not expect to improve change her symptoms. We went over the surgery and she would like to proceed. We will try to set this up at her convenience.

## 2010-12-25 NOTE — Patient Instructions (Signed)
We will schedule you for some surgery to remove a small portion of the artery that runs anterior scalp just in front of your ear. This will be sent to the pathology lab for them to look at under the microscope to see if the problem causing your headaches is something called temporal arteritis. You should be have normal activities after the surgery.

## 2010-12-27 ENCOUNTER — Ambulatory Visit (INDEPENDENT_AMBULATORY_CARE_PROVIDER_SITE_OTHER): Payer: Self-pay | Admitting: General Surgery

## 2010-12-28 DIAGNOSIS — R51 Headache: Secondary | ICD-10-CM

## 2011-01-02 ENCOUNTER — Telehealth: Payer: Self-pay | Admitting: Pulmonary Disease

## 2011-01-02 NOTE — Telephone Encounter (Signed)
Per last ov note- should be on dulera per last ov note 10/16/10. Spoke with pharmacist and she is aware of this and states nothing further needed.

## 2011-01-07 ENCOUNTER — Encounter (HOSPITAL_BASED_OUTPATIENT_CLINIC_OR_DEPARTMENT_OTHER)
Admission: RE | Disposition: A | Discharge status: Home / Self Care | Payer: Self-pay | Source: Ambulatory Visit | Attending: Surgery

## 2011-01-07 ENCOUNTER — Encounter (HOSPITAL_BASED_OUTPATIENT_CLINIC_OR_DEPARTMENT_OTHER): Payer: Self-pay | Admitting: *Deleted

## 2011-01-07 ENCOUNTER — Other Ambulatory Visit (INDEPENDENT_AMBULATORY_CARE_PROVIDER_SITE_OTHER): Payer: Self-pay | Admitting: Surgery

## 2011-01-07 ENCOUNTER — Ambulatory Visit (HOSPITAL_BASED_OUTPATIENT_CLINIC_OR_DEPARTMENT_OTHER)
Admission: RE | Admit: 2011-01-07 | Discharge: 2011-01-07 | Disposition: A | Discharge status: Home / Self Care | Payer: Medicare Other | Source: Ambulatory Visit | Attending: Surgery | Admitting: Surgery

## 2011-01-07 DIAGNOSIS — R51 Headache: Secondary | ICD-10-CM | POA: Insufficient documentation

## 2011-01-07 DIAGNOSIS — E7800 Pure hypercholesterolemia, unspecified: Secondary | ICD-10-CM | POA: Insufficient documentation

## 2011-01-07 DIAGNOSIS — E119 Type 2 diabetes mellitus without complications: Secondary | ICD-10-CM | POA: Insufficient documentation

## 2011-01-07 DIAGNOSIS — R700 Elevated erythrocyte sedimentation rate: Secondary | ICD-10-CM | POA: Insufficient documentation

## 2011-01-07 DIAGNOSIS — J45909 Unspecified asthma, uncomplicated: Secondary | ICD-10-CM | POA: Insufficient documentation

## 2011-01-07 DIAGNOSIS — I10 Essential (primary) hypertension: Secondary | ICD-10-CM | POA: Insufficient documentation

## 2011-01-07 DIAGNOSIS — IMO0001 Reserved for inherently not codable concepts without codable children: Secondary | ICD-10-CM | POA: Insufficient documentation

## 2011-01-07 HISTORY — PX: ARTERY BIOPSY: SHX891

## 2011-01-07 SURGERY — MINOR BIOPSY TEMPORAL ARTERY
Anesthesia: LOCAL | Site: Face | Laterality: Left | Wound class: Clean

## 2011-01-07 MED ORDER — SODIUM BICARBONATE 4 % IV SOLN
INTRAVENOUS | Status: DC | PRN
  Administered 2011-01-07: 1 mL via INTRAVENOUS

## 2011-01-07 MED ORDER — LIDOCAINE HCL (PF) 1 % IJ SOLN
INTRAMUSCULAR | Status: DC | PRN
  Administered 2011-01-07: 6 mL

## 2011-01-07 SURGICAL SUPPLY — 36 items
ADH SKN CLS APL DERMABOND .7 (GAUZE/BANDAGES/DRESSINGS) ×1
APL SKNCLS STERI-STRIP NONHPOA (GAUZE/BANDAGES/DRESSINGS)
BENZOIN TINCTURE PRP APPL 2/3 (GAUZE/BANDAGES/DRESSINGS) IMPLANT
BLADE SURG 15 STRL LF DISP TIS (BLADE) ×1 IMPLANT
BLADE SURG 15 STRL SS (BLADE) ×2
CLOTH BEACON ORANGE TIMEOUT ST (SAFETY) ×2 IMPLANT
DERMABOND ADVANCED (GAUZE/BANDAGES/DRESSINGS) ×1
DERMABOND ADVANCED .7 DNX12 (GAUZE/BANDAGES/DRESSINGS) IMPLANT
DRSG TEGADERM 4X4.75 (GAUZE/BANDAGES/DRESSINGS) IMPLANT
ELECT REM PT RETURN 9FT ADLT (ELECTROSURGICAL) ×2
ELECTRODE REM PT RTRN 9FT ADLT (ELECTROSURGICAL) IMPLANT
GAUZE SPONGE 4X4 12PLY STRL LF (GAUZE/BANDAGES/DRESSINGS) IMPLANT
GAUZE SPONGE 4X4 16PLY XRAY LF (GAUZE/BANDAGES/DRESSINGS) IMPLANT
GLOVE ECLIPSE 6.5 STRL STRAW (GLOVE) ×1 IMPLANT
GLOVE EUDERMIC 7 POWDERFREE (GLOVE) ×2 IMPLANT
MARKER SKIN DUAL TIP RULER LAB (MISCELLANEOUS) ×2 IMPLANT
NDL HYPO 25X1 1.5 SAFETY (NEEDLE) ×1 IMPLANT
NDL SAFETY ECLIPSE 18X1.5 (NEEDLE) ×1 IMPLANT
NEEDLE HYPO 18GX1.5 SHARP (NEEDLE) ×2
NEEDLE HYPO 25X1 1.5 SAFETY (NEEDLE) ×2 IMPLANT
PENCIL BUTTON HOLSTER BLD 10FT (ELECTRODE) ×1 IMPLANT
STRIP CLOSURE SKIN 1/2X4 (GAUZE/BANDAGES/DRESSINGS) IMPLANT
SUT ETHILON 3 0 PS 1 (SUTURE) IMPLANT
SUT ETHILON 4 0 PS 2 18 (SUTURE) IMPLANT
SUT MNCRL AB 4-0 PS2 18 (SUTURE) ×1 IMPLANT
SUT PROLENE 3 0 PS 2 (SUTURE) IMPLANT
SUT PROLENE 5 0 P 3 (SUTURE) IMPLANT
SUT VIC AB 3-0 FS2 27 (SUTURE) IMPLANT
SUT VIC AB 4-0 BRD 54 (SUTURE) IMPLANT
SUT VIC AB 4-0 P-3 18XBRD (SUTURE) IMPLANT
SUT VIC AB 4-0 P3 18 (SUTURE)
SUT VIC AB 4-0 SH 18 (SUTURE) IMPLANT
SUT VICRYL 3-0 CR8 SH (SUTURE) ×1 IMPLANT
SWABSTICK POVIDONE IODINE SNGL (MISCELLANEOUS) ×4 IMPLANT
SYR CONTROL 10ML LL (SYRINGE) ×2 IMPLANT
TOWEL OR 17X24 6PK STRL BLUE (TOWEL DISPOSABLE) IMPLANT

## 2011-01-07 NOTE — Interval H&P Note (Signed)
History and Physical Interval Note:  01/07/2011 11:13 AM  Joann Gutierrez  has presented today for surgery, with the diagnosis of headaches  The various methods of treatment have been discussed with the patient and family. After consideration of risks, benefits and other options for treatment, the patient has consented to  Procedure(s): MINOR BIOPSY TEMPORAL ARTERY as a surgical intervention .  The patients' history has been reviewed, patient examined, no change in status, stable for surgery.  I have reviewed the patients' chart and labs.  Questions were answered to the patient's satisfaction.     Marche Hottenstein J

## 2011-01-07 NOTE — Op Note (Signed)
LASHIYA A Kerner 07/11/1945 [id number] 12/25/2010  Preoperative diagnosis: headaches possible temporal arteritis  Postoperative diagnosis: same  Procedure: left temporal artery biopsy  Surgeon: Haywood Lasso, MD, FACS   Anesthesia: local 1% Xylocaine   Clinical History and Indications: patient has been having recurrent problems with left-sided headaches and elevated inflammatory markers. Left temporal artery biopsy was requested by her primary physician    Description of Procedure: I saw the patient in the prep area and confirmed the plans for the procedure. Patient was taken to the procedure room. We confirmed again the plans for the procedure. The area of the left temporal artery was prepped with Betadine and anesthetized with 1% Xylocaine. It anesthesia. I made a longitudinal incision over the artery. The artery was dissected out over a distance of 2 cm. Was ligated with 3-0 Vicryl on either end.specimens passed off the table. Everything appeared to be dry.  The incision was closed with 3-0 Vicryl, 4-0 Monocryl subcuticular, and Dermabond. The patient tolerated the procedure well and there were no complications.  Haywood Lasso, MD, FACS 01/07/2011 8:32 AM

## 2011-01-07 NOTE — H&P (View-Only) (Signed)
CC: Headache HPI: This patient's primary physician asked me to see her to consider a temporal artery biopsy. She has had a history of myalgias osteoarthritis and some elevated inflammation markers. More recently she's been having headaches particularly on the left side and pain when she touches the left side of her head. There's been no other obvious abnormality noted and with the location of her pain and the elevated flap or markers the suggestion is that this might represent temporal arteritis   ROS: She has a history of diabetes hypertension asthma hypercholesterolemia chronic myalgias elevated ESR  MEDS: Current outpatient prescriptions:albuterol (VENTOLIN HFA) 108 (90 BASE) MCG/ACT inhaler, Inhale 2 puffs into the lungs every 6 (six) hours as needed.  , Disp: , Rfl: ;  amLODipine (NORVASC) 5 MG tablet, Take 1 tablet by mouth daily., Disp: , Rfl: ;  aspirin 81 MG tablet, Take 81 mg by mouth daily.  , Disp: , Rfl: ;  atorvastatin (LIPITOR) 40 MG tablet, Take 1 tablet (40 mg total) by mouth daily., Disp: 30 tablet, Rfl: 6 Calcium Carbonate-Vit D-Min (CALTRATE PLUS PO), Take 600 mg by mouth daily.  , Disp: , Rfl: ;  citalopram (CELEXA) 20 MG tablet, Take 20 mg by mouth daily.  , Disp: , Rfl: ;  cyclobenzaprine (FLEXERIL) 10 MG tablet, Take 1 tablet by mouth daily., Disp: , Rfl: ;  dicyclomine (BENTYL) 10 MG capsule, Take 1 tablet by mouth Three times a day before meals., Disp: , Rfl:  diltiazem (CARDIZEM CD) 240 MG 24 hr capsule, Take 240 mg by mouth daily.  , Disp: , Rfl: ;  donepezil (ARICEPT) 5 MG tablet, Take 1 tablet by mouth daily., Disp: , Rfl: ;  ezetimibe (ZETIA) 10 MG tablet, Take 10 mg by mouth daily.  , Disp: , Rfl: ;  fluconazole (DIFLUCAN) 100 MG tablet, as directed., Disp: , Rfl: ;  fluocinonide (LIDEX) 0.05 % gel, Apply topically 2 (two) times daily.  , Disp: , Rfl:  fluticasone (FLONASE) 50 MCG/ACT nasal spray, Place 2 sprays into the nose daily., Disp: 16 g, Rfl: 11;  furosemide  (LASIX) 80 MG tablet, Take 80 mg by mouth 2 (two) times daily.  , Disp: , Rfl: ;  gabapentin (NEURONTIN) 300 MG capsule, Take 300 mg by mouth 2 (two) times daily. , Disp: , Rfl: ;  HUMULIN N 100 UNIT/ML injection, , Disp: , Rfl: ;  hydrALAZINE (APRESOLINE) 10 MG tablet, Take 10 mg by mouth 3 (three) times daily.  , Disp: , Rfl:  insulin glargine (LANTUS SOLOSTAR) 100 UNIT/ML injection, Inject 50 Units into the skin every morning. And pen needles 1/day, Disp: 30 mL, Rfl: 12;  Insulin Syringe-Needle U-100 (B-D INS SYR ULTRAFINE 1CC/31G) 31G X 5/16" 1 ML MISC, Use as directecd to inject insulin, Disp: 100 each, Rfl: 5;  losartan-hydrochlorothiazide (HYZAAR) 100-25 MG per tablet, Take 1 tablet by mouth daily., Disp: , Rfl:  meloxicam (MOBIC) 15 MG tablet, Take 1 tablet by mouth as needed., Disp: , Rfl: ;  metFORMIN (GLUCOPHAGE) 500 MG tablet, Take 1 tablet by mouth Twice daily., Disp: , Rfl: ;  metoCLOPramide (REGLAN) 10 MG tablet, Take 10 mg by mouth 4 (four) times daily.  , Disp: , Rfl: ;  metoprolol (LOPRESSOR) 50 MG tablet, Take 50 mg by mouth daily. , Disp: , Rfl:  mometasone-formoterol (DULERA) 100-5 MCG/ACT AERO, Inhale 2 puffs into the lungs 2 (two) times daily., Disp: 1 Inhaler, Rfl: 5;  Multiple Vitamin (MULTIVITAMIN PO), Take 1 tablet by mouth daily.  ,  Disp: , Rfl: ;  omeprazole (PRILOSEC) 40 MG capsule, Take 40 mg by mouth every 30 (thirty) minutes. Once daily. , Disp: , Rfl: ;  PEG 3350-KCl-NaBcb-NaCl-NaSulf (PEG 3350/ELECTROLYTES) 240 G SOLR, , Disp: , Rfl:  potassium chloride SA (K-DUR,KLOR-CON) 20 MEQ tablet, Take 40 mEq by mouth 2 (two) times daily.  , Disp: , Rfl: ;  raloxifene (EVISTA) 60 MG tablet, Take 60 mg by mouth daily.  , Disp: , Rfl: ;  SINGULAIR 10 MG tablet, Take 1 tablet by mouth daily., Disp: , Rfl: ;  sucralfate (CARAFATE) 1 GM/10ML suspension, Take 1 gram twice daily, Disp: 420 mL, Rfl: 1;  SYMBICORT 160-4.5 MCG/ACT inhaler, , Disp: , Rfl:  TraMADol HCl 50 MG TBSO, Take 1 tablet by  mouth 4 (four) times daily as needed.  , Disp: , Rfl: ;  zafirlukast (ACCOLATE) 20 MG tablet, Take 20 mg by mouth 2 (two) times daily.  , Disp: , Rfl:  Current facility-administered medications:0.9 %  sodium chloride infusion, 500 mL, Intravenous, Continuous, Inda Castle, MD    ALLERGIES: Allergies  Allergen Reactions  . Promethazine Hcl        PE General: The patient is alert and oriented and healthy-appearing Head: Normocephalic she does have some tenderness over the left temporal artery area. I see no other abnormalities.  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 I told her that most of the time the sternum out to be negative and that removing a segment of the artery is only for diagnostic purposes I did not expect to improve change her symptoms. We went over the surgery and she would like to proceed. We will try to set this up at her convenience.

## 2011-01-09 ENCOUNTER — Encounter (HOSPITAL_BASED_OUTPATIENT_CLINIC_OR_DEPARTMENT_OTHER): Payer: Self-pay | Admitting: Surgery

## 2011-01-10 ENCOUNTER — Telehealth (INDEPENDENT_AMBULATORY_CARE_PROVIDER_SITE_OTHER): Payer: Self-pay | Admitting: General Surgery

## 2011-01-10 NOTE — Telephone Encounter (Signed)
Message copied by Margarette Asal on Thu Jan 10, 2011 10:08 AM ------      Message from: Silvestre Moment      Created: Thu Jan 10, 2011  9:45 AM      Contact: [phone]       Dr. Margot Chimes  Did a temporal artery biopsy on this pt in Nov. The office at [facility] has not received results as of 01/10/11. Please call or fax report to Bethena Roys, RN for Dr. Trudie Reed.  Thanks, [employer]

## 2011-01-10 NOTE — Telephone Encounter (Signed)
Path results faxed to [facility].

## 2011-01-12 DIAGNOSIS — J449 Chronic obstructive pulmonary disease, unspecified: Secondary | ICD-10-CM | POA: Diagnosis not present

## 2011-01-12 DIAGNOSIS — R262 Difficulty in walking, not elsewhere classified: Secondary | ICD-10-CM | POA: Diagnosis not present

## 2011-01-12 DIAGNOSIS — E1149 Type 2 diabetes mellitus with other diabetic neurological complication: Secondary | ICD-10-CM | POA: Diagnosis not present

## 2011-01-12 DIAGNOSIS — M6240 Contracture of muscle, unspecified site: Secondary | ICD-10-CM | POA: Diagnosis not present

## 2011-01-12 DIAGNOSIS — IMO0001 Reserved for inherently not codable concepts without codable children: Secondary | ICD-10-CM | POA: Diagnosis not present

## 2011-01-12 DIAGNOSIS — G309 Alzheimer's disease, unspecified: Secondary | ICD-10-CM | POA: Diagnosis not present

## 2011-01-12 DIAGNOSIS — E1142 Type 2 diabetes mellitus with diabetic polyneuropathy: Secondary | ICD-10-CM | POA: Diagnosis not present

## 2011-01-12 DIAGNOSIS — M79609 Pain in unspecified limb: Secondary | ICD-10-CM | POA: Diagnosis not present

## 2011-01-12 DIAGNOSIS — I10 Essential (primary) hypertension: Secondary | ICD-10-CM | POA: Diagnosis not present

## 2011-01-18 DIAGNOSIS — M79609 Pain in unspecified limb: Secondary | ICD-10-CM | POA: Diagnosis not present

## 2011-01-18 DIAGNOSIS — E1149 Type 2 diabetes mellitus with other diabetic neurological complication: Secondary | ICD-10-CM | POA: Diagnosis not present

## 2011-01-18 DIAGNOSIS — M6240 Contracture of muscle, unspecified site: Secondary | ICD-10-CM | POA: Diagnosis not present

## 2011-01-18 DIAGNOSIS — J449 Chronic obstructive pulmonary disease, unspecified: Secondary | ICD-10-CM | POA: Diagnosis not present

## 2011-01-18 DIAGNOSIS — R262 Difficulty in walking, not elsewhere classified: Secondary | ICD-10-CM | POA: Diagnosis not present

## 2011-01-18 DIAGNOSIS — IMO0001 Reserved for inherently not codable concepts without codable children: Secondary | ICD-10-CM | POA: Diagnosis not present

## 2011-01-19 DIAGNOSIS — M6240 Contracture of muscle, unspecified site: Secondary | ICD-10-CM | POA: Diagnosis not present

## 2011-01-19 DIAGNOSIS — IMO0001 Reserved for inherently not codable concepts without codable children: Secondary | ICD-10-CM | POA: Diagnosis not present

## 2011-01-19 DIAGNOSIS — M79609 Pain in unspecified limb: Secondary | ICD-10-CM | POA: Diagnosis not present

## 2011-01-19 DIAGNOSIS — J449 Chronic obstructive pulmonary disease, unspecified: Secondary | ICD-10-CM | POA: Diagnosis not present

## 2011-01-19 DIAGNOSIS — E1149 Type 2 diabetes mellitus with other diabetic neurological complication: Secondary | ICD-10-CM | POA: Diagnosis not present

## 2011-01-19 DIAGNOSIS — R262 Difficulty in walking, not elsewhere classified: Secondary | ICD-10-CM | POA: Diagnosis not present

## 2011-01-23 DIAGNOSIS — M79609 Pain in unspecified limb: Secondary | ICD-10-CM | POA: Diagnosis not present

## 2011-01-23 DIAGNOSIS — IMO0001 Reserved for inherently not codable concepts without codable children: Secondary | ICD-10-CM | POA: Diagnosis not present

## 2011-01-23 DIAGNOSIS — E1149 Type 2 diabetes mellitus with other diabetic neurological complication: Secondary | ICD-10-CM | POA: Diagnosis not present

## 2011-01-23 DIAGNOSIS — R262 Difficulty in walking, not elsewhere classified: Secondary | ICD-10-CM | POA: Diagnosis not present

## 2011-01-23 DIAGNOSIS — M6240 Contracture of muscle, unspecified site: Secondary | ICD-10-CM | POA: Diagnosis not present

## 2011-01-23 DIAGNOSIS — J449 Chronic obstructive pulmonary disease, unspecified: Secondary | ICD-10-CM | POA: Diagnosis not present

## 2011-01-26 DIAGNOSIS — M79609 Pain in unspecified limb: Secondary | ICD-10-CM | POA: Diagnosis not present

## 2011-01-26 DIAGNOSIS — IMO0001 Reserved for inherently not codable concepts without codable children: Secondary | ICD-10-CM | POA: Diagnosis not present

## 2011-01-26 DIAGNOSIS — M6240 Contracture of muscle, unspecified site: Secondary | ICD-10-CM | POA: Diagnosis not present

## 2011-01-26 DIAGNOSIS — E1149 Type 2 diabetes mellitus with other diabetic neurological complication: Secondary | ICD-10-CM | POA: Diagnosis not present

## 2011-01-26 DIAGNOSIS — J449 Chronic obstructive pulmonary disease, unspecified: Secondary | ICD-10-CM | POA: Diagnosis not present

## 2011-01-26 DIAGNOSIS — R262 Difficulty in walking, not elsewhere classified: Secondary | ICD-10-CM | POA: Diagnosis not present

## 2011-01-29 DIAGNOSIS — M6240 Contracture of muscle, unspecified site: Secondary | ICD-10-CM | POA: Diagnosis not present

## 2011-01-29 DIAGNOSIS — R262 Difficulty in walking, not elsewhere classified: Secondary | ICD-10-CM | POA: Diagnosis not present

## 2011-01-29 DIAGNOSIS — M79609 Pain in unspecified limb: Secondary | ICD-10-CM | POA: Diagnosis not present

## 2011-01-29 DIAGNOSIS — E1149 Type 2 diabetes mellitus with other diabetic neurological complication: Secondary | ICD-10-CM | POA: Diagnosis not present

## 2011-01-29 DIAGNOSIS — IMO0001 Reserved for inherently not codable concepts without codable children: Secondary | ICD-10-CM | POA: Diagnosis not present

## 2011-01-29 DIAGNOSIS — J449 Chronic obstructive pulmonary disease, unspecified: Secondary | ICD-10-CM | POA: Diagnosis not present

## 2011-02-01 DIAGNOSIS — M79609 Pain in unspecified limb: Secondary | ICD-10-CM | POA: Diagnosis not present

## 2011-02-01 DIAGNOSIS — E1149 Type 2 diabetes mellitus with other diabetic neurological complication: Secondary | ICD-10-CM | POA: Diagnosis not present

## 2011-02-01 DIAGNOSIS — J449 Chronic obstructive pulmonary disease, unspecified: Secondary | ICD-10-CM | POA: Diagnosis not present

## 2011-02-01 DIAGNOSIS — R262 Difficulty in walking, not elsewhere classified: Secondary | ICD-10-CM | POA: Diagnosis not present

## 2011-02-01 DIAGNOSIS — M6240 Contracture of muscle, unspecified site: Secondary | ICD-10-CM | POA: Diagnosis not present

## 2011-02-01 DIAGNOSIS — IMO0001 Reserved for inherently not codable concepts without codable children: Secondary | ICD-10-CM | POA: Diagnosis not present

## 2011-02-04 IMAGING — CR DG CHEST 2V
1 series · 1 of 1 positions shown · non-contrast
Comparison: 01/30/2009

CLINICAL DATA: Dyspnea

CHEST - 2 VIEW

[w chest lat *]
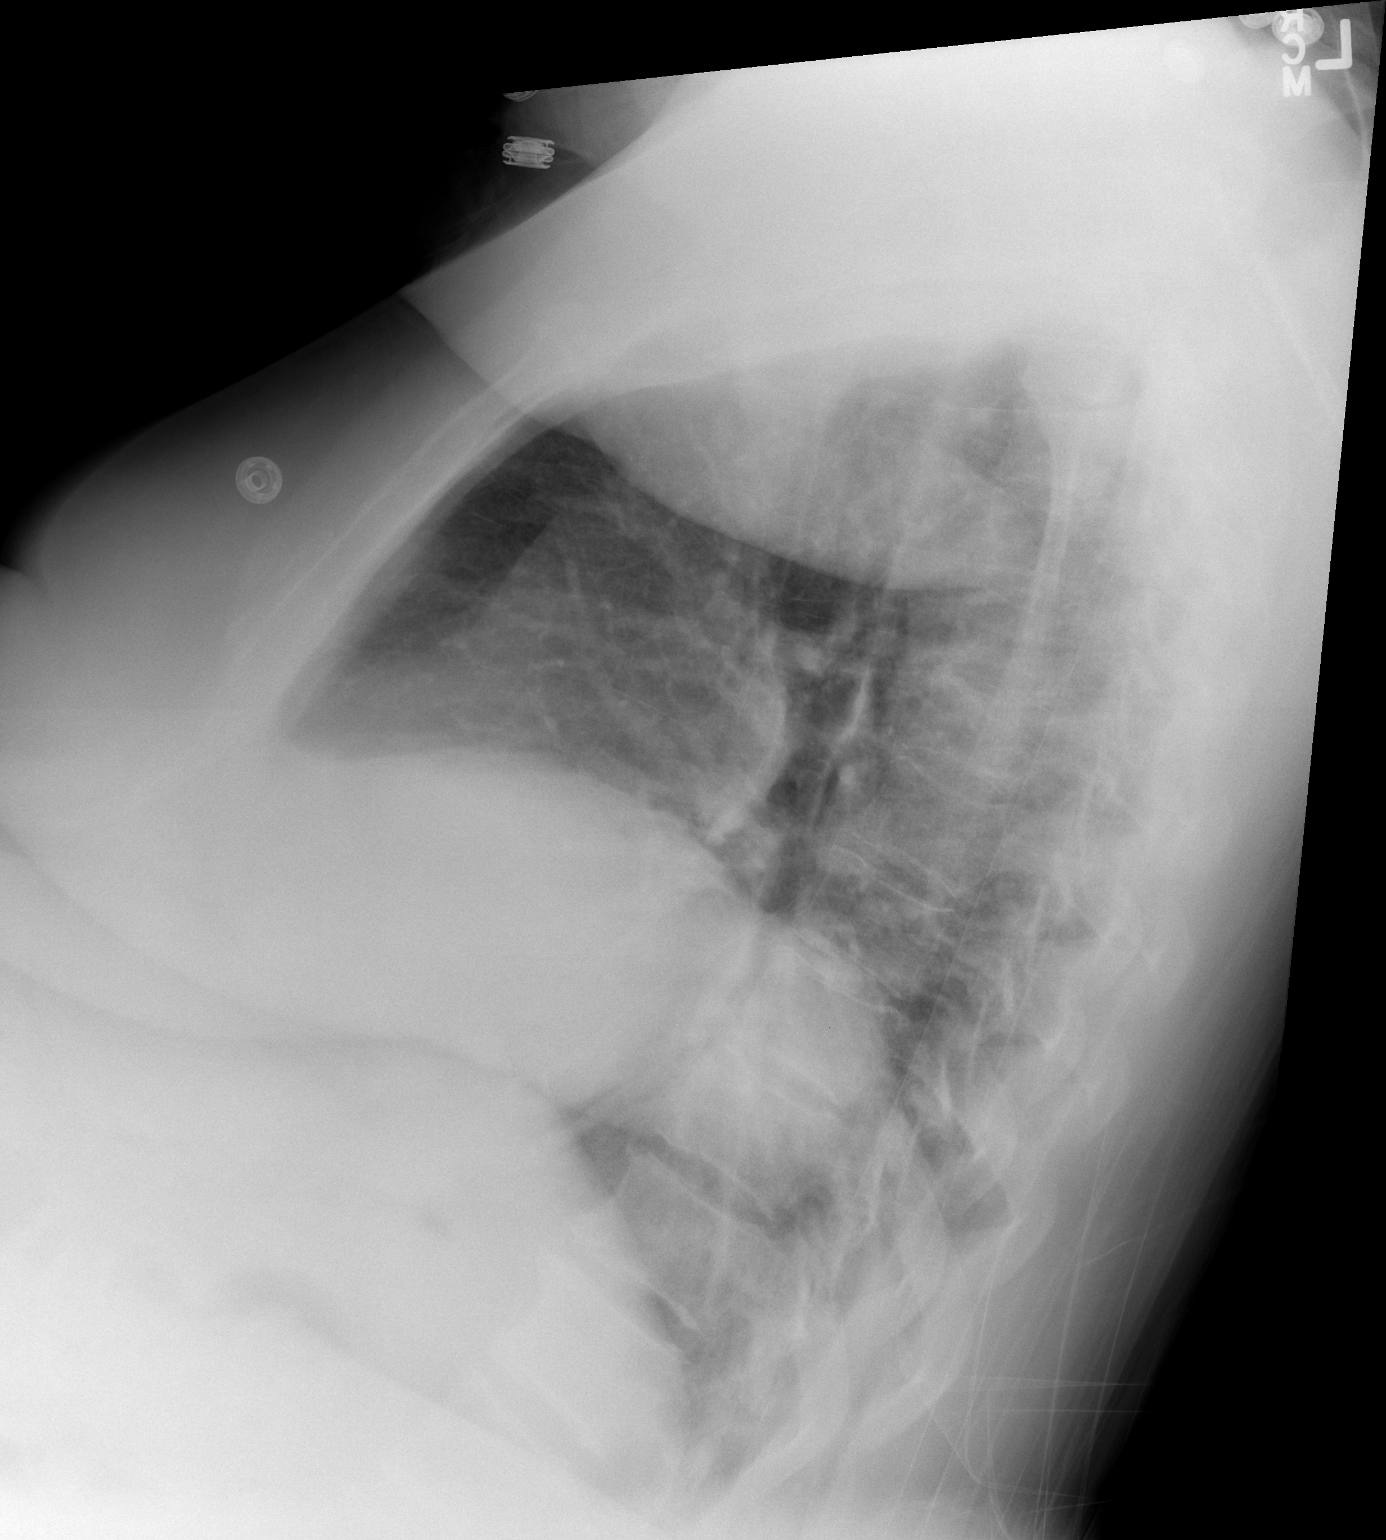

[1 of 1 positions shown; findings below may reference images not displayed]

FINDINGS: Increased opacity seen in the right lower lobe.  The
lungs are otherwise clear.  There are no pleural effusions.  The
heart is normal in size.  There is mild ectasia of the descending
aorta.  The bones are unchanged in appearance.
IMPRESSION: Right lower lobe pneumonia.  Recommend follow-up chest x-ray in 6-8
weeks to ensure radiographic resolution.

## 2011-02-05 DIAGNOSIS — R262 Difficulty in walking, not elsewhere classified: Secondary | ICD-10-CM | POA: Diagnosis not present

## 2011-02-05 DIAGNOSIS — E1149 Type 2 diabetes mellitus with other diabetic neurological complication: Secondary | ICD-10-CM | POA: Diagnosis not present

## 2011-02-05 DIAGNOSIS — J449 Chronic obstructive pulmonary disease, unspecified: Secondary | ICD-10-CM | POA: Diagnosis not present

## 2011-02-05 DIAGNOSIS — IMO0001 Reserved for inherently not codable concepts without codable children: Secondary | ICD-10-CM | POA: Diagnosis not present

## 2011-02-05 DIAGNOSIS — M6240 Contracture of muscle, unspecified site: Secondary | ICD-10-CM | POA: Diagnosis not present

## 2011-02-05 DIAGNOSIS — M79609 Pain in unspecified limb: Secondary | ICD-10-CM | POA: Diagnosis not present

## 2011-02-06 IMAGING — US US ABDOMEN COMPLETE
1 series · 13 of 25 positions shown · non-contrast
Comparison: CT of the abdomen pelvis 01/30/2009

CLINICAL DATA: Acute on chronic diastolic CHF.  Nausea, vomiting,
abdominal pain.

COMPLETE ABDOMINAL ULTRASOUND

[Series 1: us abdomen complete · 0.31mm/px · 13 of 58 slices shown]
[im 1/58]
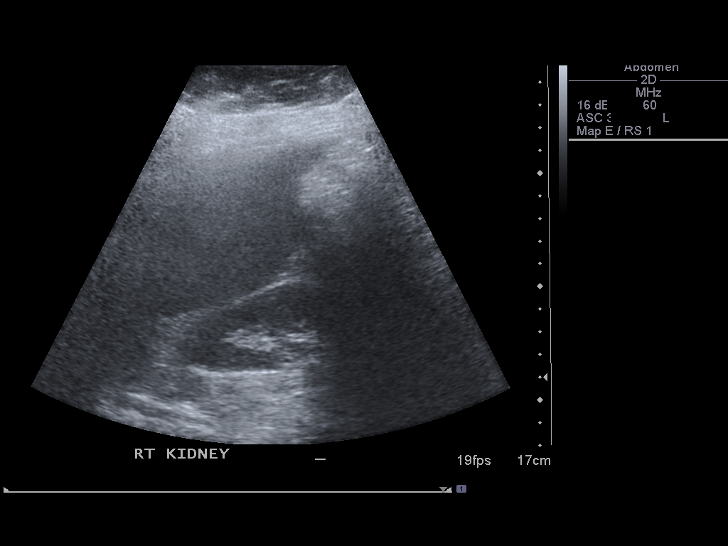
[im 5/58]
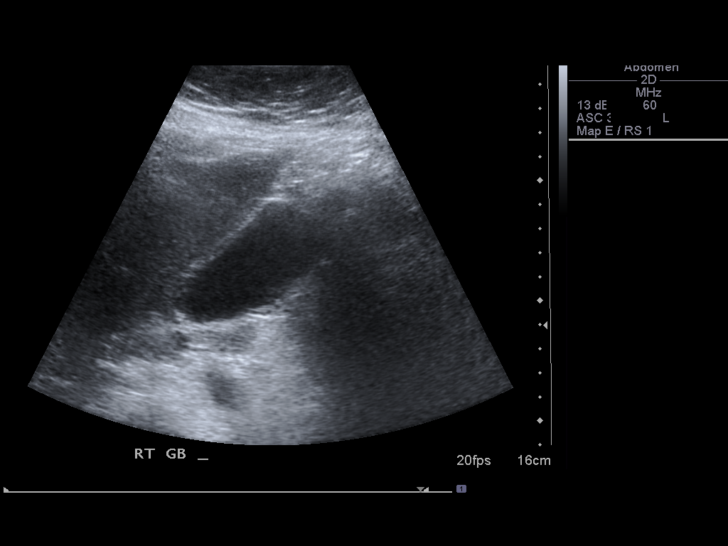
[im 10/58]
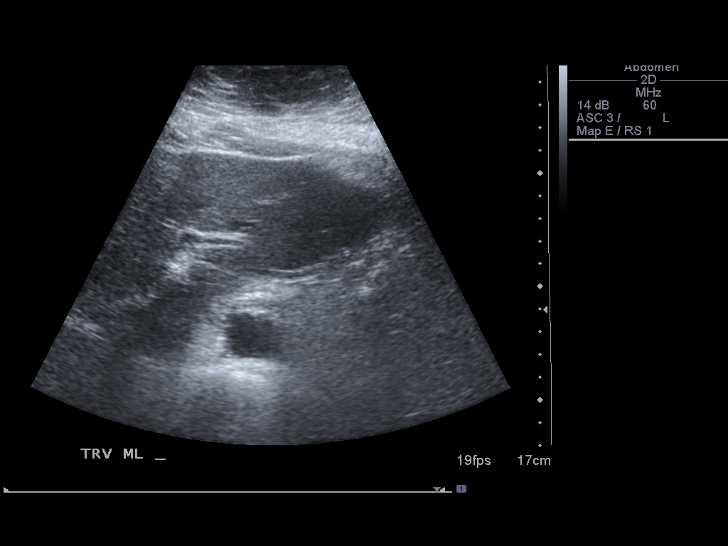
[im 15/58]
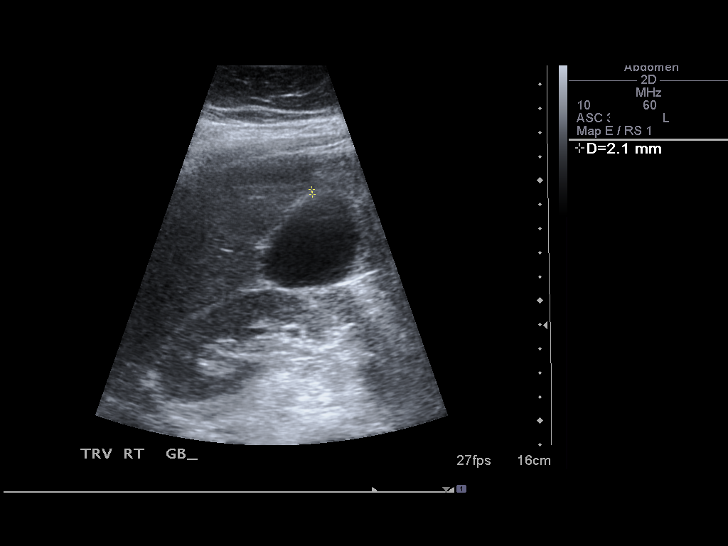
[im 20/58]
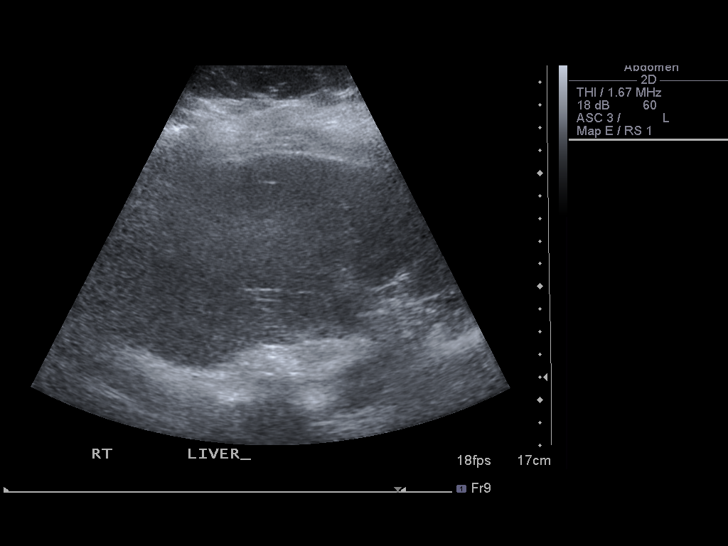
[im 24/58]
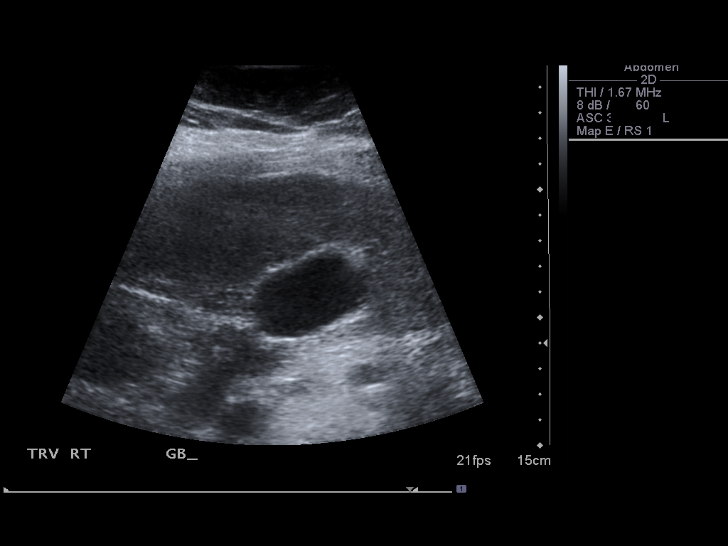
[im 29/58]
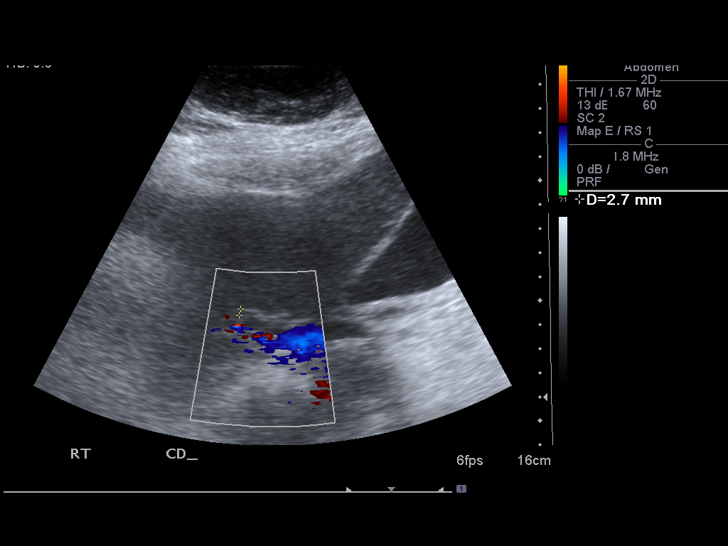
[im 34/58]
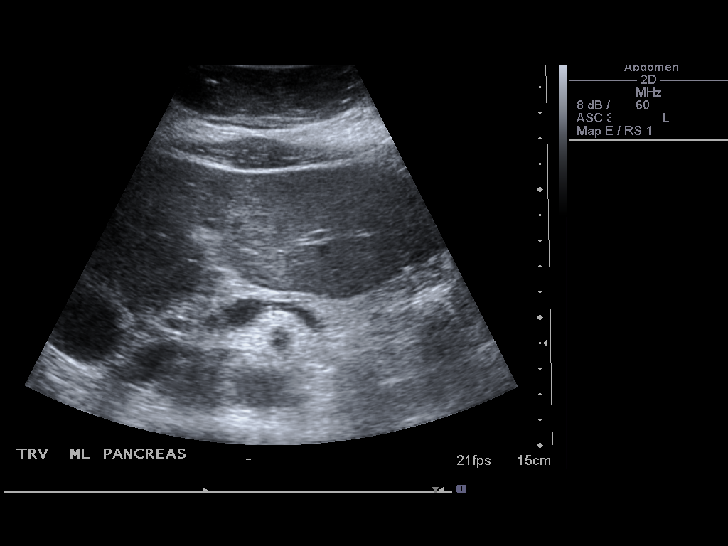
[im 39/58]
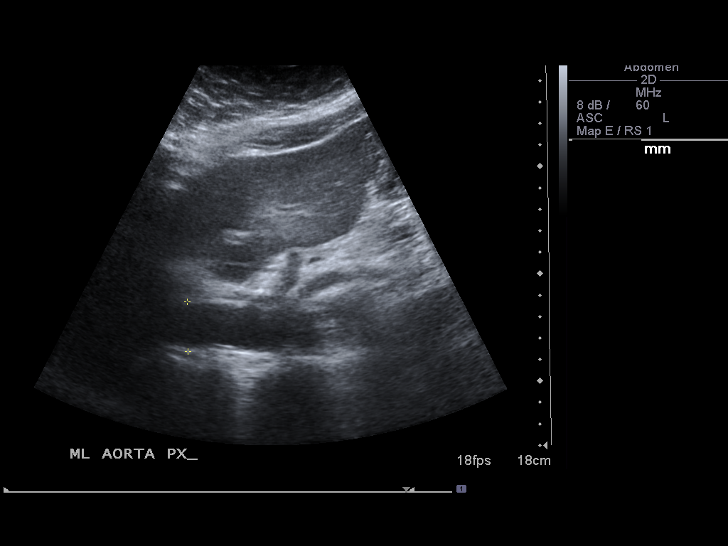
[im 43/58]
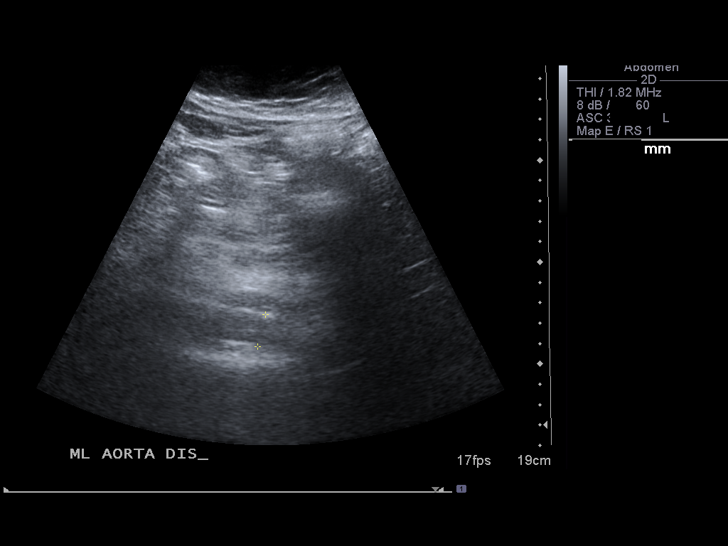
[im 48/58]
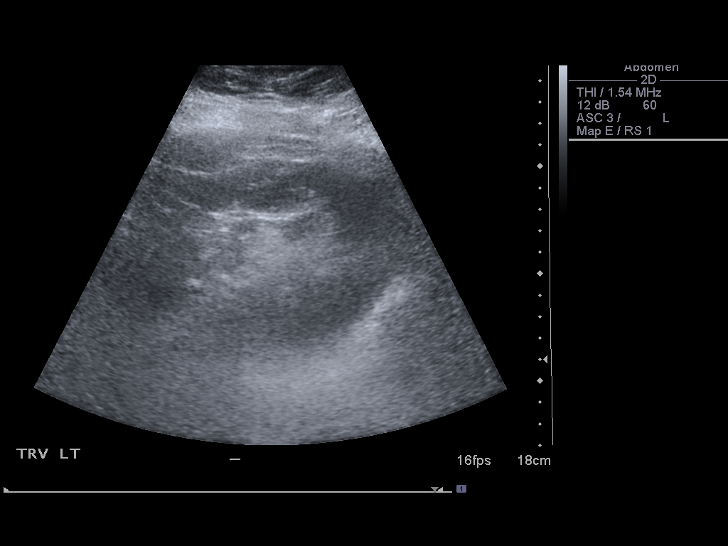
[im 53/58]
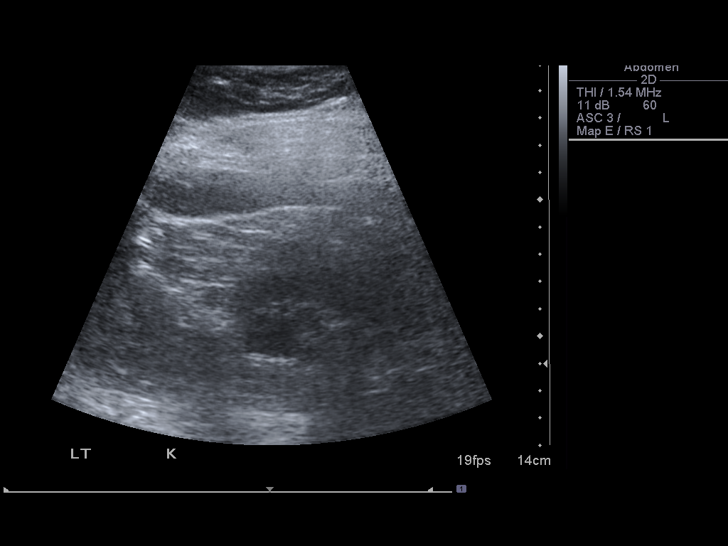
[im 58/58]
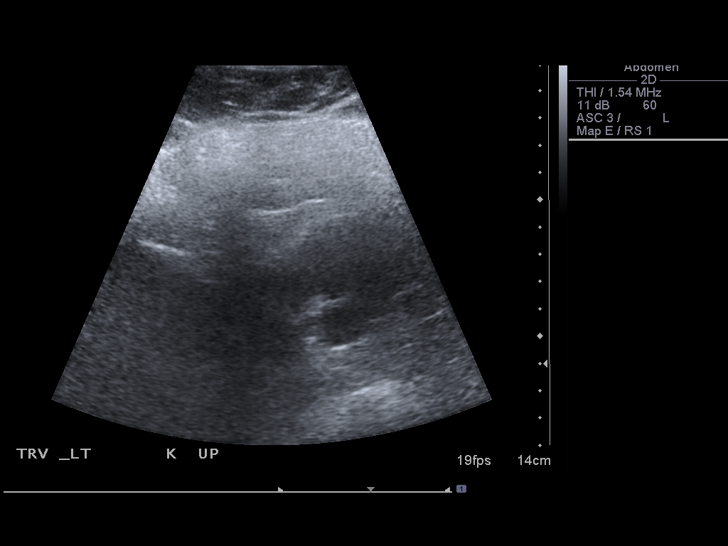

[13 of 25 positions shown; findings below may reference images not displayed]

FINDINGS: Gallbladder:  No gallstones, gallbladder wall thickening, or
pericholecystic fluid.  No sonographic Murphy's sign.  Gallbladder
wall is 2 mm in thickness, within normal limits.

Common bile duct:  Common bile duct is 3.6 mm, within normal
limits.

Liver:  No focal lesion identified.  Within normal limits in
parenchymal echogenicity.

IVC:  Appears normal.

Pancreas:  No focal abnormality seen.

Spleen:  The spleen measures 6.5 cm in length.  No focal
abnormality is identified.

Right Kidney:  The right kidney measures 9.8 cm in length.  No
focal abnormality is identified.  No hydronephrosis.

Left Kidney:  The left kidney measures 9.8 cm in length.  There is
poor visualization of the left kidney because of patient body
habitus, overlying bowel gas.  A hypoechoic area is seen measuring
2.4 cm.  This is not characterized because of poor visualization.
No hydronephrosis.

Abdominal aorta:  No aneurysm identified.
IMPRESSION: 1.  No evidence for acute cholecystitis.
2.  Poor visualization of the left kidney.  On prior CT a lower
pole cyst was identified.

## 2011-02-08 DIAGNOSIS — J449 Chronic obstructive pulmonary disease, unspecified: Secondary | ICD-10-CM | POA: Diagnosis not present

## 2011-02-08 DIAGNOSIS — E1149 Type 2 diabetes mellitus with other diabetic neurological complication: Secondary | ICD-10-CM | POA: Diagnosis not present

## 2011-02-08 DIAGNOSIS — M79609 Pain in unspecified limb: Secondary | ICD-10-CM | POA: Diagnosis not present

## 2011-02-08 DIAGNOSIS — M6240 Contracture of muscle, unspecified site: Secondary | ICD-10-CM | POA: Diagnosis not present

## 2011-02-08 DIAGNOSIS — IMO0001 Reserved for inherently not codable concepts without codable children: Secondary | ICD-10-CM | POA: Diagnosis not present

## 2011-02-08 DIAGNOSIS — R262 Difficulty in walking, not elsewhere classified: Secondary | ICD-10-CM | POA: Diagnosis not present

## 2011-02-12 ENCOUNTER — Encounter (INDEPENDENT_AMBULATORY_CARE_PROVIDER_SITE_OTHER): Payer: Self-pay | Admitting: Surgery

## 2011-02-12 ENCOUNTER — Ambulatory Visit (INDEPENDENT_AMBULATORY_CARE_PROVIDER_SITE_OTHER): Payer: Medicare Other | Admitting: Surgery

## 2011-02-12 VITALS — BP 132/88 | HR 92 | Temp 97.8°F | Resp 30 | Ht 64.0 in | Wt 277.6 lb

## 2011-02-12 DIAGNOSIS — M6240 Contracture of muscle, unspecified site: Secondary | ICD-10-CM | POA: Diagnosis not present

## 2011-02-12 DIAGNOSIS — Z09 Encounter for follow-up examination after completed treatment for conditions other than malignant neoplasm: Secondary | ICD-10-CM

## 2011-02-12 DIAGNOSIS — R262 Difficulty in walking, not elsewhere classified: Secondary | ICD-10-CM | POA: Diagnosis not present

## 2011-02-12 DIAGNOSIS — IMO0001 Reserved for inherently not codable concepts without codable children: Secondary | ICD-10-CM | POA: Diagnosis not present

## 2011-02-12 DIAGNOSIS — E1149 Type 2 diabetes mellitus with other diabetic neurological complication: Secondary | ICD-10-CM | POA: Diagnosis not present

## 2011-02-12 DIAGNOSIS — J449 Chronic obstructive pulmonary disease, unspecified: Secondary | ICD-10-CM | POA: Diagnosis not present

## 2011-02-12 DIAGNOSIS — M79609 Pain in unspecified limb: Secondary | ICD-10-CM | POA: Diagnosis not present

## 2011-02-12 NOTE — Progress Notes (Signed)
NAME: Joann Gutierrez                                            DOB: [birth year] DATE: 02/12/2011                                                  MRN: [id number]  CC: Post op   HPI: This patient comes in for post op follow-up.Sheunderwent Left temporal artery biopsy on 01/07/2011. She feels that she is doing well.  PE: General: The patient appears to be healthy, NAD Incision healing nicely  DATA REVIEWED: Path showed no evidence of temporal arteritis  IMPRESSION: The patient is doing well S/P TA Bx.    PLAN: RTC PRN. Gave her a copy of the path report

## 2011-02-18 ENCOUNTER — Encounter: Payer: Medicare Other | Admitting: Endocrinology

## 2011-02-28 ENCOUNTER — Other Ambulatory Visit (INDEPENDENT_AMBULATORY_CARE_PROVIDER_SITE_OTHER): Payer: Medicare Other

## 2011-02-28 ENCOUNTER — Encounter: Payer: Self-pay | Admitting: Endocrinology

## 2011-02-28 ENCOUNTER — Ambulatory Visit (INDEPENDENT_AMBULATORY_CARE_PROVIDER_SITE_OTHER): Payer: Medicare Other | Admitting: Endocrinology

## 2011-02-28 VITALS — BP 132/88 | HR 82 | Temp 98.3°F | Ht 64.5 in | Wt 274.6 lb

## 2011-02-28 DIAGNOSIS — E119 Type 2 diabetes mellitus without complications: Secondary | ICD-10-CM | POA: Diagnosis not present

## 2011-02-28 LAB — HEMOGLOBIN A1C: Hgb A1c MFr Bld: 10.8 % — ABNORMAL HIGH (ref 4.6–6.5)

## 2011-02-28 NOTE — Progress Notes (Signed)
Subjective:    Patient ID: Joann Gutierrez, female    DOB: 09/30/1945, 66 y.o.   MRN: [id number]  HPI Pt was advised to come in for "medicare wellness" visit, but pcp is dr Arnoldo Morale.  We'll just address DM here.  Pt returns for f/u of insulin-requiring DM (2000).  She says she cannot take 50 units of lantus per day, due to hypoglycemia before lunch.  no cbg record, but states cbg's are "170."  It is in general higher as the day goes on. Past Medical History  Diagnosis Date  . Allergy   . Depression   . Diabetes mellitus 1997    Type II   . GERD (gastroesophageal reflux disease)   . Hypertension   . Hyperlipidemia   . Osteoarthritis   . Osteoporosis   . Hair loss   . Carpal tunnel syndrome on left   . Anemia   . CAD (coronary artery disease)     Mild very minimal coronary disease with 20% obtuse marginal stenosis  . CVA (cerebral infarction)   . Diverticulosis   . Esophageal stricture   . Gastritis   . Adenomatous colon polyp   . Sickle cell trait   . Asthma     pt has cpap w/ her today  . COPD (chronic obstructive pulmonary disease)   . 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 Gastroparesis 08/21/2007  . RENAL INSUFFICIENCY 02/16/2009  . OSTEOARTHRITIS 08/09/2006  . Carpal tunnel syndrome, left   . Morbid obesity   . Hernia, hiatal   . CPAP (continuous positive airway pressure) dependence   . CHF (congestive heart failure)   . Stroke   . Sickle cell anemia   . Seizures     Past Surgical History  Procedure Date  . Abdominal hysterectomy   . Replacement total knee 1998    Left  . Esophagogastroduodenoscopy 06/25/2006  . Breast lumpectomy     benign  . Erd 08/08/2000  . Breast lumpectomy     both breast lumps removed   . Artery biopsy 01/07/2011    Procedure: MINOR BIOPSY TEMPORAL ARTERY;  Surgeon: Haywood Lasso, MD;  Location: [facility];  Service: General;  Laterality: Left;  left temporal artery biopsy     History   Social History  . Marital Status: Widowed    Spouse Name: N/A    Number of Children: 1  . Years of Education: N/A   Occupational History  . Disabled    Social History Main Topics  . Smoking status: Former Smoker -- 0.5 packs/day    Quit date: 04/18/1980  . Smokeless tobacco: Never Used  . Alcohol Use: No  . Drug Use: No  . Sexually Active: Not on file   Other Topics Concern  . Not on file   Social History Narrative   Previously worked as a Electrical engineer.Daily Caffeine Use-Coffee and Tea    Current Outpatient Prescriptions on File Prior to Visit  Medication Sig Dispense Refill  . albuterol (VENTOLIN HFA) 108 (90 BASE) MCG/ACT inhaler Inhale 2 puffs into the lungs every 6 (six) hours as needed.        Marland Kitchen amLODipine (NORVASC) 5 MG tablet Take 1 tablet by mouth daily.      Marland Kitchen aspirin 81 MG tablet Take 81 mg by mouth daily.        Marland Kitchen atorvastatin (LIPITOR) 40 MG tablet Take 1 tablet (40 mg total) by mouth daily.  30 tablet  6  .  Calcium Carbonate-Vit D-Min (CALTRATE PLUS PO) Take 600 mg by mouth daily.        . citalopram (CELEXA) 20 MG tablet Take 20 mg by mouth daily.        . cyclobenzaprine (FLEXERIL) 10 MG tablet Take 1 tablet by mouth daily.      Marland Kitchen dicyclomine (BENTYL) 10 MG capsule Take 1 tablet by mouth Three times a day before meals.      Marland Kitchen diltiazem (CARDIZEM CD) 240 MG 24 hr capsule Take 240 mg by mouth daily.        Marland Kitchen donepezil (ARICEPT) 5 MG tablet Take 1 tablet by mouth daily.      Marland Kitchen ezetimibe (ZETIA) 10 MG tablet Take 10 mg by mouth daily.        . fluconazole (DIFLUCAN) 100 MG tablet as directed.      . fluocinonide (LIDEX) 0.05 % gel Apply topically 2 (two) times daily.        . fluticasone (FLONASE) 50 MCG/ACT nasal spray Place 2 sprays into the nose daily.  16 g  11  . furosemide (LASIX) 80 MG tablet Take 80 mg by mouth 2 (two) times daily.        Marland Kitchen gabapentin (NEURONTIN) 300 MG capsule Take 300 mg by mouth 2 (two) times daily.       . hydrALAZINE  (APRESOLINE) 10 MG tablet Take 10 mg by mouth 3 (three) times daily.        Marland Kitchen ibuprofen (ADVIL,MOTRIN) 800 MG tablet Ad lib.      . Insulin Syringe-Needle U-100 (B-D INS SYR ULTRAFINE 1CC/31G) 31G X 5/16" 1 ML MISC Use as directecd to inject insulin  100 each  5  . losartan-hydrochlorothiazide (HYZAAR) 100-25 MG per tablet Take 1 tablet by mouth daily.      . meloxicam (MOBIC) 15 MG tablet Take 1 tablet by mouth as needed.      . metoCLOPramide (REGLAN) 10 MG tablet Take 10 mg by mouth 4 (four) times daily.        . metoprolol (LOPRESSOR) 50 MG tablet Take 50 mg by mouth daily.       . mometasone-formoterol (DULERA) 100-5 MCG/ACT AERO Inhale 2 puffs into the lungs 2 (two) times daily.  1 Inhaler  5  . Multiple Vitamin (MULTIVITAMIN PO) Take 1 tablet by mouth daily.        Marland Kitchen PEG 3350-KCl-NaBcb-NaCl-NaSulf (PEG 3350/ELECTROLYTES) 240 G SOLR       . potassium chloride SA (K-DUR,KLOR-CON) 20 MEQ tablet Take 40 mEq by mouth 2 (two) times daily.        . raloxifene (EVISTA) 60 MG tablet Take 60 mg by mouth daily.        Marland Kitchen SINGULAIR 10 MG tablet Take 1 tablet by mouth daily.      . sucralfate (CARAFATE) 1 GM/10ML suspension Take 1 gram twice daily  420 mL  1  . SYMBICORT 160-4.5 MCG/ACT inhaler Inhale 2 puffs into the lungs 2 (two) times daily.       . TraMADol HCl 50 MG TBSO Take 1 tablet by mouth 4 (four) times daily as needed.        . zafirlukast (ACCOLATE) 20 MG tablet Take 20 mg by mouth 2 (two) times daily.         Current Facility-Administered Medications on File Prior to Visit  Medication Dose Route Frequency Provider Last Rate Last Dose  . 0.9 %  sodium chloride infusion  500 mL Intravenous Continuous Sandy Salaam  Deatra Ina, MD        Allergies  Allergen Reactions  . Promethazine Hcl     Family History  Problem Relation Age of Onset  . Colon cancer Brother   . Cancer Brother     Colon Cancer  . Cancer Mother     Liver Cancer  . Diabetes Mother   . Kidney disease Mother   . Heart  disease Mother     age 33's  . Heart disease Father   . Heart attack Father     died of MI when pt was 39  . Sickle cell anemia Father   . Diabetes Sister   . Kidney disease Sister   . Heart disease Sister     age 37's  . Allergies Sister   . Diabetes Sister   . Kidney disease Sister   . Heart disease Sister     age 13's    BP 132/88  Pulse 82  Temp(Src) 98.3 F (36.8 C) (Oral)  Ht 5' 4.5" (1.638 m)  Wt 274 lb 9.6 oz (124.558 kg)  BMI 46.41 kg/m2  SpO2 96%    Review of Systems Denies loc    Objective:   Physical Exam VITAL SIGNS:  See vs page GENERAL: no distress Pulses: dorsalis pedis intact bilat.   Feet: no deformity.  no ulcer on the feet.  feet are of normal color and temp.  no edema Neuro: sensation is intact to touch on the feet    Lab Results  Component Value Date   HGBA1C 10.8* 02/28/2011   i reviewed electrocardiogram (no change since 11/28/09).      Assessment & Plan:  DM, therapy limited by noncompliance with cbg recording.  i'll do the best i can.

## 2011-02-28 NOTE — Patient Instructions (Addendum)
blood tests are being ordered for you today.  please call [phone] to hear your test results.  You will be prompted to enter the 9-digit "MRN" number that appears at the top left of this page, followed by #.  Then you will hear the message. Reduce lantus to 45 units each morning.  check your blood sugar 2 times a day.  vary the time of day when you check, between before the 3 meals, and at bedtime.  also check if you have symptoms of your blood sugar being too high or too low.  please keep a record of the readings and bring it to your next appointment here.  please call us sooner if you are having low blood sugar episodes.  Stop metformin.   (update: i left message on phone-tree:  Next step is to record cbg's)

## 2011-03-15 DIAGNOSIS — M2550 Pain in unspecified joint: Secondary | ICD-10-CM | POA: Diagnosis not present

## 2011-03-15 DIAGNOSIS — R05 Cough: Secondary | ICD-10-CM | POA: Diagnosis not present

## 2011-03-15 DIAGNOSIS — IMO0001 Reserved for inherently not codable concepts without codable children: Secondary | ICD-10-CM | POA: Diagnosis not present

## 2011-03-15 DIAGNOSIS — R700 Elevated erythrocyte sedimentation rate: Secondary | ICD-10-CM | POA: Diagnosis not present

## 2011-03-15 DIAGNOSIS — R5381 Other malaise: Secondary | ICD-10-CM | POA: Diagnosis not present

## 2011-03-15 DIAGNOSIS — M1990 Unspecified osteoarthritis, unspecified site: Secondary | ICD-10-CM | POA: Diagnosis not present

## 2011-03-15 DIAGNOSIS — Z79899 Other long term (current) drug therapy: Secondary | ICD-10-CM | POA: Diagnosis not present

## 2011-03-15 DIAGNOSIS — R51 Headache: Secondary | ICD-10-CM | POA: Diagnosis not present

## 2011-03-19 ENCOUNTER — Ambulatory Visit (INDEPENDENT_AMBULATORY_CARE_PROVIDER_SITE_OTHER): Payer: Medicare Other | Admitting: Pulmonary Disease

## 2011-03-19 ENCOUNTER — Encounter: Payer: Self-pay | Admitting: Pulmonary Disease

## 2011-03-19 DIAGNOSIS — J449 Chronic obstructive pulmonary disease, unspecified: Secondary | ICD-10-CM | POA: Diagnosis not present

## 2011-03-19 DIAGNOSIS — G4733 Obstructive sleep apnea (adult) (pediatric): Secondary | ICD-10-CM | POA: Diagnosis not present

## 2011-03-19 DIAGNOSIS — J309 Allergic rhinitis, unspecified: Secondary | ICD-10-CM

## 2011-03-19 NOTE — Assessment & Plan Note (Signed)
She is to continue her sinus regimen.

## 2011-03-19 NOTE — Assessment & Plan Note (Signed)
Will continue symbicort, singulair, and prn albuterol.  She has difficulty keeping her medication list straight.  I reviewed her inhaler regimen.

## 2011-03-19 NOTE — Patient Instructions (Signed)
Symbicort two puffs twice per day, and rinse mouth after using Singulair 10 mg one pill at night Flonase two sprays in each side of your nose daily Albuterol two puffs as needed for cough, wheeze, or chest congestion Follow up in 6 months

## 2011-03-19 NOTE — Progress Notes (Signed)
Chief Complaint  Patient presents with  . Follow-up    prod cough with pink mucus, left ear congestion, increased SOB, wheezing x43month - denies f/c/s    History of Present Illness: Joann Gutierrez is a 66 y.o. female with dyspnea from asthma, rhinitis with post-nasal drip, obesity, OSA on CPAP 9 cm and 2nd pulmonary hypertension  Her hoarseness is better.  She is not having much sinus congestion or sore throat.  She is not having any wheeze.  She didn't notice much difference with dulera.  She has stiffness in her left neck and ear when she turns her head.  She denies fever.  She has cough with thick white to pink sputum, mostly in the morning.  She is using her CPAP every night, and feels this helps her sleep and energy level.   Past Medical History  Diagnosis Date  . Allergy   . Depression   . Diabetes mellitus 1997    Type II   . GERD (gastroesophageal reflux disease)   . Hypertension   . Hyperlipidemia   . Osteoarthritis   . Osteoporosis   . Hair loss   . Carpal tunnel syndrome on left   . Anemia   . CAD (coronary artery disease)     Mild very minimal coronary disease with 20% obtuse marginal stenosis  . CVA (cerebral infarction)   . Diverticulosis   . Esophageal stricture   . Gastritis   . Adenomatous colon polyp   . Sickle cell trait   . Asthma     pt has cpap w/ her today  . COPD (chronic obstructive pulmonary disease)   . 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 Gastroparesis 08/21/2007  . RENAL INSUFFICIENCY 02/16/2009  . OSTEOARTHRITIS 08/09/2006  . Carpal tunnel syndrome, left   . Morbid obesity   . Hernia, hiatal   . CPAP (continuous positive airway pressure) dependence   . CHF (congestive heart failure)   . Stroke   . Sickle cell anemia   . Seizures     Past Surgical History  Procedure Date  . Abdominal hysterectomy   . Replacement total knee 1998    Left  . Esophagogastroduodenoscopy 06/25/2006  . Breast  lumpectomy     benign  . Erd 08/08/2000  . Breast lumpectomy     both breast lumps removed   . Artery biopsy 01/07/2011    Procedure: MINOR BIOPSY TEMPORAL ARTERY;  Surgeon: Haywood Lasso, MD;  Location: [facility];  Service: General;  Laterality: Left;  left temporal artery biopsy    Allergies  Allergen Reactions  . Promethazine Hcl     Physical Exam:  Blood pressure 132/84, pulse 52, temperature 97.2 F (36.2 C), temperature source Oral, height 5' 4.5" (1.638 m), weight 273 lb 12.8 oz (124.195 kg), SpO2 98.00%. Body mass index is 46.27 kg/(m^2). Wt Readings from Last 2 Encounters:  03/19/11 273 lb 12.8 oz (124.195 kg)  02/28/11 274 lb 9.6 oz (124.558 kg)   General - obese, dyspneic  HEENT - clear nasal discharge, no oral exudate, no LAN, TM clear b/l Chest - b/l wheeze with scattered rhonchi  Cardiac - s1s2 regular, no murmur  Abd - soft, nontender  Ext - minimal ankle edema  Neuro - normal strength, CN intact  Psych - anxious  Assessment/Plan:  Outpatient Encounter Prescriptions as of 03/19/2011  Medication Sig Dispense Refill  . albuterol (VENTOLIN HFA) 108 (90 BASE) MCG/ACT inhaler Inhale 2 puffs into the  lungs every 6 (six) hours as needed.        Marland Kitchen amLODipine (NORVASC) 5 MG tablet Take 1 tablet by mouth daily.      Marland Kitchen aspirin 81 MG tablet Take 81 mg by mouth daily.        Marland Kitchen atorvastatin (LIPITOR) 40 MG tablet Take 1 tablet (40 mg total) by mouth daily.  30 tablet  6  . Calcium Carbonate-Vit D-Min (CALTRATE PLUS PO) Take 600 mg by mouth daily.        . citalopram (CELEXA) 20 MG tablet Take 20 mg by mouth daily.        . cyclobenzaprine (FLEXERIL) 10 MG tablet Take 1 tablet by mouth daily.      Marland Kitchen dicyclomine (BENTYL) 10 MG capsule Take 1 tablet by mouth Three times a day before meals.      Marland Kitchen diltiazem (CARDIZEM CD) 240 MG 24 hr capsule Take 240 mg by mouth daily.        Marland Kitchen donepezil (ARICEPT) 5 MG tablet Take 1 tablet by mouth daily.      Marland Kitchen ezetimibe  (ZETIA) 10 MG tablet Take 10 mg by mouth daily.        . fluconazole (DIFLUCAN) 100 MG tablet as directed.      . fluocinonide (LIDEX) 0.05 % gel Apply topically 2 (two) times daily.        . fluticasone (FLONASE) 50 MCG/ACT nasal spray Place 2 sprays into the nose daily.  16 g  11  . furosemide (LASIX) 80 MG tablet Take 80 mg by mouth 2 (two) times daily.        Marland Kitchen gabapentin (NEURONTIN) 300 MG capsule Take 300 mg by mouth 2 (two) times daily.       . hydrALAZINE (APRESOLINE) 10 MG tablet Take 10 mg by mouth 3 (three) times daily.        Marland Kitchen ibuprofen (ADVIL,MOTRIN) 800 MG tablet Ad lib.      Marland Kitchen insulin glargine (LANTUS) 100 UNIT/ML injection Inject 45 Units into the skin every morning. And pen needles 1/day      . Insulin Syringe-Needle U-100 (B-D INS SYR ULTRAFINE 1CC/31G) 31G X 5/16" 1 ML MISC Use as directecd to inject insulin  100 each  5  . losartan-hydrochlorothiazide (HYZAAR) 100-25 MG per tablet Take 1 tablet by mouth daily.      . meloxicam (MOBIC) 15 MG tablet Take 1 tablet by mouth as needed.      . metoCLOPramide (REGLAN) 10 MG tablet Take 10 mg by mouth 4 (four) times daily.        . metoprolol (LOPRESSOR) 50 MG tablet Take 50 mg by mouth daily.       . Multiple Vitamin (MULTIVITAMIN PO) Take 1 tablet by mouth daily.        Marland Kitchen omeprazole (PRILOSEC) 20 MG capsule Take 40 mg by mouth daily.       Marland Kitchen PEG 3350-KCl-NaBcb-NaCl-NaSulf (PEG 3350/ELECTROLYTES) 240 G SOLR       . potassium chloride SA (K-DUR,KLOR-CON) 20 MEQ tablet Take 40 mEq by mouth 2 (two) times daily.        . raloxifene (EVISTA) 60 MG tablet Take 60 mg by mouth daily.        Marland Kitchen SINGULAIR 10 MG tablet Take 1 tablet by mouth daily.      . sucralfate (CARAFATE) 1 GM/10ML suspension Take 1 gram twice daily  420 mL  1  . SYMBICORT 160-4.5 MCG/ACT inhaler Inhale 2 puffs into the  lungs 2 (two) times daily.       . TraMADol HCl 50 MG TBSO Take 1 tablet by mouth 4 (four) times daily as needed.        Marland Kitchen DISCONTD:  mometasone-formoterol (DULERA) 100-5 MCG/ACT AERO Inhale 2 puffs into the lungs 2 (two) times daily.  1 Inhaler  5  . diclofenac (VOLTAREN) 75 MG EC tablet Take 1 tablet by mouth daily.      Marland Kitchen HUMULIN N 100 UNIT/ML injection Inject 45 Units into the skin Every morning.      . metFORMIN (GLUCOPHAGE) 500 MG tablet Take 1 tablet by mouth Twice daily.      Marland Kitchen DISCONTD: zafirlukast (ACCOLATE) 20 MG tablet Take 20 mg by mouth 2 (two) times daily.         Facility-Administered Encounter Medications as of 03/19/2011  Medication Dose Route Frequency Provider Last Rate Last Dose  . DISCONTD: 0.9 %  sodium chloride infusion  500 mL Intravenous Continuous Inda Castle, MD        Kwanza Cancelliere Pager:  [phone] 03/19/2011, 3:44 PM

## 2011-03-19 NOTE — Assessment & Plan Note (Signed)
She is compliant with therapy and reports benefit.  Will continue current set up.

## 2011-03-20 DIAGNOSIS — M542 Cervicalgia: Secondary | ICD-10-CM | POA: Diagnosis not present

## 2011-03-20 DIAGNOSIS — M62838 Other muscle spasm: Secondary | ICD-10-CM | POA: Diagnosis not present

## 2011-03-20 DIAGNOSIS — F0390 Unspecified dementia without behavioral disturbance: Secondary | ICD-10-CM | POA: Diagnosis not present

## 2011-03-20 DIAGNOSIS — R51 Headache: Secondary | ICD-10-CM | POA: Diagnosis not present

## 2011-03-26 DIAGNOSIS — M542 Cervicalgia: Secondary | ICD-10-CM | POA: Diagnosis not present

## 2011-03-28 DIAGNOSIS — M542 Cervicalgia: Secondary | ICD-10-CM | POA: Diagnosis not present

## 2011-04-02 DIAGNOSIS — M542 Cervicalgia: Secondary | ICD-10-CM | POA: Diagnosis not present

## 2011-04-04 DIAGNOSIS — M542 Cervicalgia: Secondary | ICD-10-CM | POA: Diagnosis not present

## 2011-04-09 DIAGNOSIS — M542 Cervicalgia: Secondary | ICD-10-CM | POA: Diagnosis not present

## 2011-04-11 DIAGNOSIS — M542 Cervicalgia: Secondary | ICD-10-CM | POA: Diagnosis not present

## 2011-04-16 DIAGNOSIS — M542 Cervicalgia: Secondary | ICD-10-CM | POA: Diagnosis not present

## 2011-04-18 DIAGNOSIS — R944 Abnormal results of kidney function studies: Secondary | ICD-10-CM | POA: Diagnosis not present

## 2011-04-18 DIAGNOSIS — M542 Cervicalgia: Secondary | ICD-10-CM | POA: Diagnosis not present

## 2011-04-25 DIAGNOSIS — M62838 Other muscle spasm: Secondary | ICD-10-CM | POA: Diagnosis not present

## 2011-04-25 DIAGNOSIS — F0280 Dementia in other diseases classified elsewhere without behavioral disturbance: Secondary | ICD-10-CM | POA: Diagnosis not present

## 2011-04-25 DIAGNOSIS — G4489 Other headache syndrome: Secondary | ICD-10-CM | POA: Diagnosis not present

## 2011-04-25 DIAGNOSIS — M542 Cervicalgia: Secondary | ICD-10-CM | POA: Diagnosis not present

## 2011-04-29 ENCOUNTER — Other Ambulatory Visit: Payer: Self-pay | Admitting: *Deleted

## 2011-04-29 MED ORDER — GABAPENTIN 300 MG PO CAPS: 300.0000 mg | ORAL_CAPSULE | Freq: Two times a day (BID) | ORAL | Status: DC

## 2011-04-29 MED ORDER — DONEPEZIL HCL 5 MG PO TABS: 5.0000 mg | ORAL_TABLET | Freq: Every day | ORAL | Status: DC

## 2011-04-29 MED ORDER — INSULIN NPH (HUMAN) (ISOPHANE) 100 UNIT/ML ~~LOC~~ SUSP: 45.0000 [IU] | Freq: Every morning | SUBCUTANEOUS | Status: DC

## 2011-04-29 MED ORDER — CYCLOBENZAPRINE HCL 10 MG PO TABS: 10.0000 mg | ORAL_TABLET | Freq: Every day | ORAL | Status: DC

## 2011-04-29 NOTE — Telephone Encounter (Signed)
Rx sent for Humulin N.

## 2011-04-29 NOTE — Telephone Encounter (Signed)
Please refill prn 

## 2011-04-29 NOTE — Telephone Encounter (Signed)
R'cd fax from [facility] for refills. R'cd fax for Humulin N rx-is pt still taking Humulin N and Lantus or just Lantus-please advise.

## 2011-04-30 ENCOUNTER — Other Ambulatory Visit: Payer: Self-pay | Admitting: Endocrinology

## 2011-04-30 MED ORDER — INSULIN NPH (HUMAN) (ISOPHANE) 100 UNIT/ML ~~LOC~~ SUSP: 45.0000 [IU] | Freq: Every morning | SUBCUTANEOUS | Status: DC

## 2011-05-01 ENCOUNTER — Telehealth: Payer: Self-pay | Admitting: *Deleted

## 2011-05-01 MED ORDER — ALBUTEROL SULFATE HFA 108 (90 BASE) MCG/ACT IN AERS: 2.0000 | INHALATION_SPRAY | Freq: Four times a day (QID) | RESPIRATORY_TRACT | Status: DC | PRN

## 2011-05-01 MED ORDER — BUDESONIDE-FORMOTEROL FUMARATE 160-4.5 MCG/ACT IN AERO: 2.0000 | INHALATION_SPRAY | Freq: Two times a day (BID) | RESPIRATORY_TRACT | Status: DC

## 2011-05-01 NOTE — Telephone Encounter (Signed)
Received a refill from pharmacy for proair and dulera. But according to last OV note pt was to continue symbiocrt not dulera. I verified with the pt and she states she is taking symbicort. So refill sent for proair and dulera. Lakin Bing, CMA

## 2011-05-07 DIAGNOSIS — M542 Cervicalgia: Secondary | ICD-10-CM | POA: Diagnosis not present

## 2011-05-08 ENCOUNTER — Telehealth: Payer: Self-pay | Admitting: Gastroenterology

## 2011-05-08 NOTE — Telephone Encounter (Signed)
Pt states she has pain on the left side of her throat that runs up by her ear and eye. Let pt know that this was not a problem that Dr. Deatra Ina would routinely handle. Pt instructed to call her PCP or go to an urgent care. Pt verbalized understanding.

## 2011-05-09 DIAGNOSIS — M542 Cervicalgia: Secondary | ICD-10-CM | POA: Diagnosis not present

## 2011-05-10 DIAGNOSIS — I10 Essential (primary) hypertension: Secondary | ICD-10-CM | POA: Diagnosis not present

## 2011-05-10 DIAGNOSIS — E7800 Pure hypercholesterolemia, unspecified: Secondary | ICD-10-CM | POA: Diagnosis not present

## 2011-05-10 DIAGNOSIS — R413 Other amnesia: Secondary | ICD-10-CM | POA: Diagnosis not present

## 2011-05-14 DIAGNOSIS — M542 Cervicalgia: Secondary | ICD-10-CM | POA: Diagnosis not present

## 2011-05-16 DIAGNOSIS — E1129 Type 2 diabetes mellitus with other diabetic kidney complication: Secondary | ICD-10-CM | POA: Diagnosis not present

## 2011-05-16 DIAGNOSIS — E1165 Type 2 diabetes mellitus with hyperglycemia: Secondary | ICD-10-CM | POA: Diagnosis not present

## 2011-05-16 DIAGNOSIS — M542 Cervicalgia: Secondary | ICD-10-CM | POA: Diagnosis not present

## 2011-05-18 ENCOUNTER — Inpatient Hospital Stay (HOSPITAL_COMMUNITY)
Admission: EM | Admit: 2011-05-18 | Discharge: 2011-05-24 | DRG: 189 | Disposition: A | Discharge status: Home / Self Care | Payer: Medicare Other | Attending: Internal Medicine | Admitting: Internal Medicine

## 2011-05-18 ENCOUNTER — Emergency Department (HOSPITAL_COMMUNITY): Payer: Medicare Other

## 2011-05-18 ENCOUNTER — Encounter (HOSPITAL_COMMUNITY): Payer: Self-pay | Admitting: *Deleted

## 2011-05-18 DIAGNOSIS — J189 Pneumonia, unspecified organism: Secondary | ICD-10-CM

## 2011-05-18 DIAGNOSIS — J069 Acute upper respiratory infection, unspecified: Secondary | ICD-10-CM | POA: Diagnosis not present

## 2011-05-18 DIAGNOSIS — R140 Abdominal distension (gaseous): Secondary | ICD-10-CM

## 2011-05-18 DIAGNOSIS — I5032 Chronic diastolic (congestive) heart failure: Secondary | ICD-10-CM | POA: Diagnosis present

## 2011-05-18 DIAGNOSIS — J45901 Unspecified asthma with (acute) exacerbation: Secondary | ICD-10-CM | POA: Diagnosis not present

## 2011-05-18 DIAGNOSIS — D509 Iron deficiency anemia, unspecified: Secondary | ICD-10-CM

## 2011-05-18 DIAGNOSIS — I509 Heart failure, unspecified: Secondary | ICD-10-CM | POA: Diagnosis not present

## 2011-05-18 DIAGNOSIS — Z6841 Body Mass Index (BMI) 40.0 and over, adult: Secondary | ICD-10-CM

## 2011-05-18 DIAGNOSIS — E875 Hyperkalemia: Secondary | ICD-10-CM | POA: Diagnosis present

## 2011-05-18 DIAGNOSIS — E871 Hypo-osmolality and hyponatremia: Secondary | ICD-10-CM | POA: Diagnosis present

## 2011-05-18 DIAGNOSIS — IMO0002 Reserved for concepts with insufficient information to code with codable children: Secondary | ICD-10-CM | POA: Diagnosis present

## 2011-05-18 DIAGNOSIS — G4733 Obstructive sleep apnea (adult) (pediatric): Secondary | ICD-10-CM

## 2011-05-18 DIAGNOSIS — E872 Acidosis, unspecified: Secondary | ICD-10-CM | POA: Diagnosis present

## 2011-05-18 DIAGNOSIS — J309 Allergic rhinitis, unspecified: Secondary | ICD-10-CM

## 2011-05-18 DIAGNOSIS — N179 Acute kidney failure, unspecified: Secondary | ICD-10-CM | POA: Diagnosis present

## 2011-05-18 DIAGNOSIS — R51 Headache: Secondary | ICD-10-CM | POA: Diagnosis not present

## 2011-05-18 DIAGNOSIS — J441 Chronic obstructive pulmonary disease with (acute) exacerbation: Secondary | ICD-10-CM | POA: Diagnosis present

## 2011-05-18 DIAGNOSIS — J449 Chronic obstructive pulmonary disease, unspecified: Secondary | ICD-10-CM | POA: Diagnosis present

## 2011-05-18 DIAGNOSIS — J9620 Acute and chronic respiratory failure, unspecified whether with hypoxia or hypercapnia: Principal | ICD-10-CM

## 2011-05-18 DIAGNOSIS — N183 Chronic kidney disease, stage 3 unspecified: Secondary | ICD-10-CM

## 2011-05-18 DIAGNOSIS — E785 Hyperlipidemia, unspecified: Secondary | ICD-10-CM | POA: Diagnosis present

## 2011-05-18 DIAGNOSIS — I129 Hypertensive chronic kidney disease with stage 1 through stage 4 chronic kidney disease, or unspecified chronic kidney disease: Secondary | ICD-10-CM | POA: Diagnosis present

## 2011-05-18 DIAGNOSIS — M79673 Pain in unspecified foot: Secondary | ICD-10-CM

## 2011-05-18 DIAGNOSIS — K3184 Gastroparesis: Secondary | ICD-10-CM

## 2011-05-18 DIAGNOSIS — D649 Anemia, unspecified: Secondary | ICD-10-CM

## 2011-05-18 DIAGNOSIS — N259 Disorder resulting from impaired renal tubular function, unspecified: Secondary | ICD-10-CM

## 2011-05-18 DIAGNOSIS — E119 Type 2 diabetes mellitus without complications: Secondary | ICD-10-CM

## 2011-05-18 DIAGNOSIS — I10 Essential (primary) hypertension: Secondary | ICD-10-CM | POA: Diagnosis not present

## 2011-05-18 DIAGNOSIS — R000 Tachycardia, unspecified: Secondary | ICD-10-CM

## 2011-05-18 DIAGNOSIS — D72829 Elevated white blood cell count, unspecified: Secondary | ICD-10-CM

## 2011-05-18 DIAGNOSIS — G5790 Unspecified mononeuropathy of unspecified lower limb: Secondary | ICD-10-CM

## 2011-05-18 DIAGNOSIS — R1319 Other dysphagia: Secondary | ICD-10-CM

## 2011-05-18 DIAGNOSIS — K219 Gastro-esophageal reflux disease without esophagitis: Secondary | ICD-10-CM

## 2011-05-18 DIAGNOSIS — J40 Bronchitis, not specified as acute or chronic: Secondary | ICD-10-CM | POA: Diagnosis not present

## 2011-05-18 DIAGNOSIS — E1169 Type 2 diabetes mellitus with other specified complication: Secondary | ICD-10-CM | POA: Diagnosis present

## 2011-05-18 DIAGNOSIS — Z800 Family history of malignant neoplasm of digestive organs: Secondary | ICD-10-CM

## 2011-05-18 DIAGNOSIS — R1310 Dysphagia, unspecified: Secondary | ICD-10-CM | POA: Diagnosis present

## 2011-05-18 DIAGNOSIS — R1013 Epigastric pain: Secondary | ICD-10-CM

## 2011-05-18 DIAGNOSIS — E7800 Pure hypercholesterolemia, unspecified: Secondary | ICD-10-CM

## 2011-05-18 LAB — COMPREHENSIVE METABOLIC PANEL
Albumin: 3.3 g/dL — ABNORMAL LOW (ref 3.5–5.2)
CO2: 30 mEq/L (ref 19–32)
Chloride: 103 mEq/L (ref 96–112)
Glucose, Bld: 220 mg/dL — ABNORMAL HIGH (ref 70–99)
Potassium: 3.9 mEq/L (ref 3.5–5.1)
Sodium: 141 mEq/L (ref 135–145)
Total Protein: 8.2 g/dL (ref 6.0–8.3)

## 2011-05-18 LAB — CBC
Hemoglobin: 11.5 g/dL — ABNORMAL LOW (ref 12.0–15.0)
MCH: 23.3 pg — ABNORMAL LOW (ref 26.0–34.0)
Platelets: 252 10*3/uL (ref 150–400)
RBC: 4.93 MIL/uL (ref 3.87–5.11)
WBC: 17.3 10*3/uL — ABNORMAL HIGH (ref 4.0–10.5)

## 2011-05-18 LAB — PRO B NATRIURETIC PEPTIDE: Pro B Natriuretic peptide (BNP): 1703 pg/mL — ABNORMAL HIGH (ref 0–125)

## 2011-05-18 LAB — POCT I-STAT TROPONIN I: Troponin i, poc: 0.02 ng/mL (ref 0.00–0.08)

## 2011-05-18 MED ORDER — ALBUTEROL SULFATE (5 MG/ML) 0.5% IN NEBU
2.5000 mg | INHALATION_SOLUTION | Freq: Once | RESPIRATORY_TRACT | Status: AC
  Administered 2011-05-18: 2.5 mg via RESPIRATORY_TRACT
  Filled 2011-05-18: qty 0.5

## 2011-05-18 MED ORDER — ACETAMINOPHEN 325 MG PO TABS
650.0000 mg | ORAL_TABLET | Freq: Once | ORAL | Status: AC
  Administered 2011-05-18: 650 mg via ORAL
  Filled 2011-05-18: qty 2

## 2011-05-18 MED ORDER — MOXIFLOXACIN HCL IN NACL 400 MG/250ML IV SOLN
400.0000 mg | Freq: Once | INTRAVENOUS | Status: AC
  Administered 2011-05-18: 400 mg via INTRAVENOUS
  Filled 2011-05-18: qty 250

## 2011-05-18 MED ORDER — ALBUTEROL SULFATE (5 MG/ML) 0.5% IN NEBU: 2.5000 mg | INHALATION_SOLUTION | Freq: Once | RESPIRATORY_TRACT | Status: DC

## 2011-05-18 MED ORDER — MOXIFLOXACIN HCL 400 MG PO TABS: 400.0000 mg | ORAL_TABLET | Freq: Once | ORAL | Status: DC

## 2011-05-18 MED ORDER — ASPIRIN 81 MG PO CHEW
324.0000 mg | CHEWABLE_TABLET | Freq: Once | ORAL | Status: AC
  Administered 2011-05-18: 324 mg via ORAL
  Filled 2011-05-18: qty 4

## 2011-05-18 MED ORDER — SODIUM CHLORIDE 0.9 % IV BOLUS (SEPSIS)
500.0000 mL | Freq: Once | INTRAVENOUS | Status: AC
  Administered 2011-05-19: 500 mL via INTRAVENOUS

## 2011-05-18 MED ORDER — PREDNISONE 20 MG PO TABS: 60.0000 mg | ORAL_TABLET | Freq: Once | ORAL | Status: DC

## 2011-05-18 MED ORDER — ALBUTEROL (5 MG/ML) CONTINUOUS INHALATION SOLN
10.0000 mg/h | INHALATION_SOLUTION | Freq: Once | RESPIRATORY_TRACT | Status: AC
  Administered 2011-05-18: 10 mg/h via RESPIRATORY_TRACT

## 2011-05-18 MED ORDER — IPRATROPIUM BROMIDE 0.02 % IN SOLN
0.5000 mg | Freq: Once | RESPIRATORY_TRACT | Status: DC
  Filled 2011-05-18: qty 2.5

## 2011-05-18 MED ORDER — IPRATROPIUM BROMIDE 0.02 % IN SOLN
0.5000 mg | Freq: Once | RESPIRATORY_TRACT | Status: AC
  Administered 2011-05-18: 0.5 mg via RESPIRATORY_TRACT

## 2011-05-18 MED ORDER — METHYLPREDNISOLONE SODIUM SUCC 125 MG IJ SOLR
125.0000 mg | Freq: Once | INTRAMUSCULAR | Status: AC
  Administered 2011-05-18: 125 mg via INTRAVENOUS
  Filled 2011-05-18: qty 2

## 2011-05-18 NOTE — ED Notes (Signed)
MD at bedside. 

## 2011-05-18 NOTE — ED Notes (Signed)
Pt in c/o headache and body aches, sore throat and facial pain x2 days

## 2011-05-19 ENCOUNTER — Observation Stay (HOSPITAL_COMMUNITY): Payer: Medicare Other

## 2011-05-19 ENCOUNTER — Encounter (HOSPITAL_COMMUNITY): Payer: Self-pay | Admitting: Internal Medicine

## 2011-05-19 DIAGNOSIS — R079 Chest pain, unspecified: Secondary | ICD-10-CM | POA: Diagnosis not present

## 2011-05-19 DIAGNOSIS — E1129 Type 2 diabetes mellitus with other diabetic kidney complication: Secondary | ICD-10-CM | POA: Diagnosis not present

## 2011-05-19 DIAGNOSIS — N281 Cyst of kidney, acquired: Secondary | ICD-10-CM | POA: Diagnosis not present

## 2011-05-19 DIAGNOSIS — N179 Acute kidney failure, unspecified: Secondary | ICD-10-CM | POA: Diagnosis not present

## 2011-05-19 DIAGNOSIS — I10 Essential (primary) hypertension: Secondary | ICD-10-CM | POA: Diagnosis not present

## 2011-05-19 DIAGNOSIS — G4733 Obstructive sleep apnea (adult) (pediatric): Secondary | ICD-10-CM | POA: Diagnosis present

## 2011-05-19 DIAGNOSIS — J449 Chronic obstructive pulmonary disease, unspecified: Secondary | ICD-10-CM | POA: Diagnosis not present

## 2011-05-19 DIAGNOSIS — IMO0002 Reserved for concepts with insufficient information to code with codable children: Secondary | ICD-10-CM | POA: Diagnosis present

## 2011-05-19 DIAGNOSIS — J9620 Acute and chronic respiratory failure, unspecified whether with hypoxia or hypercapnia: Secondary | ICD-10-CM | POA: Diagnosis present

## 2011-05-19 DIAGNOSIS — D72829 Elevated white blood cell count, unspecified: Secondary | ICD-10-CM | POA: Diagnosis present

## 2011-05-19 DIAGNOSIS — N183 Chronic kidney disease, stage 3 unspecified: Secondary | ICD-10-CM | POA: Diagnosis not present

## 2011-05-19 DIAGNOSIS — J45901 Unspecified asthma with (acute) exacerbation: Secondary | ICD-10-CM | POA: Diagnosis not present

## 2011-05-19 DIAGNOSIS — E785 Hyperlipidemia, unspecified: Secondary | ICD-10-CM | POA: Diagnosis present

## 2011-05-19 DIAGNOSIS — J40 Bronchitis, not specified as acute or chronic: Secondary | ICD-10-CM | POA: Diagnosis present

## 2011-05-19 DIAGNOSIS — I129 Hypertensive chronic kidney disease with stage 1 through stage 4 chronic kidney disease, or unspecified chronic kidney disease: Secondary | ICD-10-CM | POA: Diagnosis not present

## 2011-05-19 DIAGNOSIS — E872 Acidosis, unspecified: Secondary | ICD-10-CM | POA: Diagnosis present

## 2011-05-19 DIAGNOSIS — I369 Nonrheumatic tricuspid valve disorder, unspecified: Secondary | ICD-10-CM | POA: Diagnosis not present

## 2011-05-19 DIAGNOSIS — E871 Hypo-osmolality and hyponatremia: Secondary | ICD-10-CM | POA: Diagnosis not present

## 2011-05-19 DIAGNOSIS — J441 Chronic obstructive pulmonary disease with (acute) exacerbation: Secondary | ICD-10-CM | POA: Diagnosis present

## 2011-05-19 DIAGNOSIS — D649 Anemia, unspecified: Secondary | ICD-10-CM | POA: Diagnosis present

## 2011-05-19 DIAGNOSIS — E1169 Type 2 diabetes mellitus with other specified complication: Secondary | ICD-10-CM | POA: Diagnosis not present

## 2011-05-19 DIAGNOSIS — I517 Cardiomegaly: Secondary | ICD-10-CM | POA: Diagnosis not present

## 2011-05-19 DIAGNOSIS — K219 Gastro-esophageal reflux disease without esophagitis: Secondary | ICD-10-CM | POA: Diagnosis present

## 2011-05-19 DIAGNOSIS — R000 Tachycardia, unspecified: Secondary | ICD-10-CM | POA: Diagnosis present

## 2011-05-19 DIAGNOSIS — R0602 Shortness of breath: Secondary | ICD-10-CM | POA: Diagnosis not present

## 2011-05-19 DIAGNOSIS — N189 Chronic kidney disease, unspecified: Secondary | ICD-10-CM | POA: Diagnosis not present

## 2011-05-19 DIAGNOSIS — Z6841 Body Mass Index (BMI) 40.0 and over, adult: Secondary | ICD-10-CM | POA: Diagnosis not present

## 2011-05-19 DIAGNOSIS — I509 Heart failure, unspecified: Secondary | ICD-10-CM | POA: Diagnosis present

## 2011-05-19 DIAGNOSIS — J209 Acute bronchitis, unspecified: Secondary | ICD-10-CM | POA: Diagnosis not present

## 2011-05-19 DIAGNOSIS — E875 Hyperkalemia: Secondary | ICD-10-CM | POA: Diagnosis present

## 2011-05-19 DIAGNOSIS — J189 Pneumonia, unspecified organism: Secondary | ICD-10-CM | POA: Diagnosis not present

## 2011-05-19 DIAGNOSIS — I5032 Chronic diastolic (congestive) heart failure: Secondary | ICD-10-CM | POA: Diagnosis present

## 2011-05-19 LAB — CARDIAC PANEL(CRET KIN+CKTOT+MB+TROPI)
CK, MB: 2.8 ng/mL (ref 0.3–4.0)
Relative Index: 0.4 (ref 0.0–2.5)
Relative Index: 0.5 (ref 0.0–2.5)
Relative Index: 0.5 (ref 0.0–2.5)
Total CK: 664 U/L — ABNORMAL HIGH (ref 7–177)
Troponin I: <0.3 ng/mL (ref <0.30)
Troponin I: <0.3 ng/mL (ref <0.30)

## 2011-05-19 LAB — GLUCOSE, CAPILLARY
Glucose-Capillary: 338 mg/dL — ABNORMAL HIGH (ref 70–99)
Glucose-Capillary: 96 mg/dL (ref 70–99)
Glucose-Capillary: 188 mg/dL — ABNORMAL HIGH (ref 70–99)

## 2011-05-19 LAB — BASIC METABOLIC PANEL
Calcium: 8.9 mg/dL (ref 8.4–10.5)
GFR calc non Af Amer: 33 mL/min — ABNORMAL LOW (ref >90)
Potassium: 3.3 mEq/L — ABNORMAL LOW (ref 3.5–5.1)
Sodium: 140 mEq/L (ref 135–145)

## 2011-05-19 LAB — HEMOGLOBIN A1C: Hgb A1c MFr Bld: 11.3 % — ABNORMAL HIGH (ref <5.7)

## 2011-05-19 LAB — FOLATE: Folate: >20 ng/mL

## 2011-05-19 LAB — CBC
Hemoglobin: 10 g/dL — ABNORMAL LOW (ref 12.0–15.0)
Platelets: 245 10*3/uL (ref 150–400)
RBC: 4.31 MIL/uL (ref 3.87–5.11)
WBC: 14.8 10*3/uL — ABNORMAL HIGH (ref 4.0–10.5)

## 2011-05-19 LAB — IRON AND TIBC
Iron: 19 ug/dL — ABNORMAL LOW (ref 42–135)
Saturation Ratios: 9 % — ABNORMAL LOW (ref 20–55)
TIBC: 213 ug/dL — ABNORMAL LOW (ref 250–470)
UIBC: 194 ug/dL (ref 125–400)

## 2011-05-19 LAB — FERRITIN: Ferritin: 246 ng/mL (ref 10–291)

## 2011-05-19 LAB — INFLUENZA PANEL BY PCR (TYPE A & B): Influenza B By PCR: NEGATIVE

## 2011-05-19 LAB — RETICULOCYTES: Retic Count, Absolute: 73.3 10*3/uL (ref 19.0–186.0)

## 2011-05-19 MED ORDER — INSULIN GLARGINE 100 UNIT/ML ~~LOC~~ SOLN
45.0000 [IU] | Freq: Two times a day (BID) | SUBCUTANEOUS | Status: DC
  Administered 2011-05-19 – 2011-05-22 (×6): 45 [IU] via SUBCUTANEOUS

## 2011-05-19 MED ORDER — CITALOPRAM HYDROBROMIDE 20 MG PO TABS
20.0000 mg | ORAL_TABLET | Freq: Every day | ORAL | Status: DC
  Administered 2011-05-19 – 2011-05-24 (×6): 20 mg via ORAL
  Filled 2011-05-19 (×6): qty 1

## 2011-05-19 MED ORDER — MORPHINE SULFATE 2 MG/ML IJ SOLN
1.0000 mg | INTRAMUSCULAR | Status: DC | PRN
  Administered 2011-05-19: 1 mg via INTRAVENOUS
  Filled 2011-05-19: qty 1

## 2011-05-19 MED ORDER — INSULIN GLARGINE 100 UNIT/ML ~~LOC~~ SOLN: 60.0000 [IU] | Freq: Two times a day (BID) | SUBCUTANEOUS | Status: DC

## 2011-05-19 MED ORDER — DICYCLOMINE HCL 10 MG PO CAPS
10.0000 mg | ORAL_CAPSULE | Freq: Three times a day (TID) | ORAL | Status: DC
  Administered 2011-05-19 – 2011-05-24 (×17): 10 mg via ORAL
  Filled 2011-05-19 (×19): qty 1

## 2011-05-19 MED ORDER — PANTOPRAZOLE SODIUM 40 MG PO TBEC
80.0000 mg | DELAYED_RELEASE_TABLET | Freq: Two times a day (BID) | ORAL | Status: DC
  Administered 2011-05-20 – 2011-05-24 (×9): 80 mg via ORAL
  Filled 2011-05-19 (×12): qty 2

## 2011-05-19 MED ORDER — ATORVASTATIN CALCIUM 40 MG PO TABS
40.0000 mg | ORAL_TABLET | Freq: Every day | ORAL | Status: DC
  Administered 2011-05-19 – 2011-05-24 (×6): 40 mg via ORAL
  Filled 2011-05-19 (×6): qty 1

## 2011-05-19 MED ORDER — LOSARTAN POTASSIUM 50 MG PO TABS
100.0000 mg | ORAL_TABLET | Freq: Every day | ORAL | Status: DC
  Administered 2011-05-19: 100 mg via ORAL
  Filled 2011-05-19 (×2): qty 2

## 2011-05-19 MED ORDER — METOCLOPRAMIDE HCL 10 MG PO TABS
10.0000 mg | ORAL_TABLET | Freq: Four times a day (QID) | ORAL | Status: DC
  Administered 2011-05-19 – 2011-05-24 (×22): 10 mg via ORAL
  Filled 2011-05-19 (×25): qty 1

## 2011-05-19 MED ORDER — AMLODIPINE BESYLATE 5 MG PO TABS
5.0000 mg | ORAL_TABLET | Freq: Every day | ORAL | Status: DC
  Administered 2011-05-19 – 2011-05-24 (×6): 5 mg via ORAL
  Filled 2011-05-19 (×6): qty 1

## 2011-05-19 MED ORDER — INSULIN ASPART 100 UNIT/ML ~~LOC~~ SOLN
0.0000 [IU] | Freq: Three times a day (TID) | SUBCUTANEOUS | Status: DC
  Administered 2011-05-19: 15 [IU] via SUBCUTANEOUS
  Administered 2011-05-20: 4 [IU] via SUBCUTANEOUS
  Administered 2011-05-20: 7 [IU] via SUBCUTANEOUS
  Administered 2011-05-21: 3 [IU] via SUBCUTANEOUS

## 2011-05-19 MED ORDER — METHYLPREDNISOLONE SODIUM SUCC 40 MG IJ SOLR
40.0000 mg | Freq: Three times a day (TID) | INTRAMUSCULAR | Status: DC
  Administered 2011-05-19 – 2011-05-20 (×4): 40 mg via INTRAVENOUS
  Filled 2011-05-19 (×7): qty 1

## 2011-05-19 MED ORDER — PANTOPRAZOLE SODIUM 40 MG PO TBEC
80.0000 mg | DELAYED_RELEASE_TABLET | Freq: Every day | ORAL | Status: DC
  Administered 2011-05-19: 80 mg via ORAL
  Filled 2011-05-19: qty 2

## 2011-05-19 MED ORDER — EZETIMIBE 10 MG PO TABS
10.0000 mg | ORAL_TABLET | Freq: Every day | ORAL | Status: DC
  Administered 2011-05-19 – 2011-05-24 (×6): 10 mg via ORAL
  Filled 2011-05-19 (×6): qty 1

## 2011-05-19 MED ORDER — DEXTROSE 5 % IV SOLN
1.0000 g | INTRAVENOUS | Status: DC
  Administered 2011-05-19 – 2011-05-20 (×2): 1 g via INTRAVENOUS
  Filled 2011-05-19 (×2): qty 10

## 2011-05-19 MED ORDER — INSULIN ASPART 100 UNIT/ML ~~LOC~~ SOLN
0.0000 [IU] | Freq: Three times a day (TID) | SUBCUTANEOUS | Status: DC
  Administered 2011-05-19: 15 [IU] via SUBCUTANEOUS

## 2011-05-19 MED ORDER — LOSARTAN POTASSIUM-HCTZ 100-25 MG PO TABS: 1.0000 | ORAL_TABLET | Freq: Every day | ORAL | Status: DC

## 2011-05-19 MED ORDER — DICLOFENAC SODIUM 75 MG PO TBEC
75.0000 mg | DELAYED_RELEASE_TABLET | Freq: Every day | ORAL | Status: DC
  Administered 2011-05-19 – 2011-05-21 (×3): 75 mg via ORAL
  Filled 2011-05-19 (×3): qty 1

## 2011-05-19 MED ORDER — INSULIN ASPART 100 UNIT/ML ~~LOC~~ SOLN
3.0000 [IU] | Freq: Three times a day (TID) | SUBCUTANEOUS | Status: DC
  Administered 2011-05-19: 3 [IU] via SUBCUTANEOUS

## 2011-05-19 MED ORDER — HYDRALAZINE HCL 10 MG PO TABS
10.0000 mg | ORAL_TABLET | Freq: Three times a day (TID) | ORAL | Status: DC
  Administered 2011-05-19 – 2011-05-24 (×16): 10 mg via ORAL
  Filled 2011-05-19 (×18): qty 1

## 2011-05-19 MED ORDER — ONDANSETRON HCL 4 MG PO TABS: 4.0000 mg | ORAL_TABLET | Freq: Four times a day (QID) | ORAL | Status: DC | PRN

## 2011-05-19 MED ORDER — ONDANSETRON HCL 4 MG/2ML IJ SOLN: 4.0000 mg | Freq: Four times a day (QID) | INTRAMUSCULAR | Status: DC | PRN

## 2011-05-19 MED ORDER — FLUTICASONE PROPIONATE 50 MCG/ACT NA SUSP
2.0000 | Freq: Every day | NASAL | Status: DC
  Administered 2011-05-19 – 2011-05-24 (×6): 2 via NASAL
  Filled 2011-05-19: qty 16

## 2011-05-19 MED ORDER — FUROSEMIDE 80 MG PO TABS
80.0000 mg | ORAL_TABLET | Freq: Two times a day (BID) | ORAL | Status: DC
  Administered 2011-05-19 – 2011-05-20 (×3): 80 mg via ORAL
  Filled 2011-05-19 (×5): qty 1

## 2011-05-19 MED ORDER — CYCLOBENZAPRINE HCL 10 MG PO TABS
10.0000 mg | ORAL_TABLET | Freq: Every day | ORAL | Status: DC
  Administered 2011-05-19 – 2011-05-24 (×6): 10 mg via ORAL
  Filled 2011-05-19 (×6): qty 1

## 2011-05-19 MED ORDER — SODIUM CHLORIDE 0.9 % IV SOLN
INTRAVENOUS | Status: AC
  Administered 2011-05-19: 02:00:00 via INTRAVENOUS

## 2011-05-19 MED ORDER — ENOXAPARIN SODIUM 40 MG/0.4ML ~~LOC~~ SOLN
40.0000 mg | SUBCUTANEOUS | Status: DC
  Administered 2011-05-19: 40 mg via SUBCUTANEOUS
  Filled 2011-05-19 (×2): qty 0.4

## 2011-05-19 MED ORDER — DILTIAZEM HCL ER COATED BEADS 240 MG PO CP24
240.0000 mg | ORAL_CAPSULE | Freq: Every day | ORAL | Status: DC
  Administered 2011-05-19 – 2011-05-24 (×6): 240 mg via ORAL
  Filled 2011-05-19 (×6): qty 1

## 2011-05-19 MED ORDER — INSULIN NPH (HUMAN) (ISOPHANE) 100 UNIT/ML ~~LOC~~ SUSP
45.0000 [IU] | Freq: Every day | SUBCUTANEOUS | Status: DC
  Administered 2011-05-19: 45 [IU] via SUBCUTANEOUS
  Filled 2011-05-19: qty 10

## 2011-05-19 MED ORDER — DONEPEZIL HCL 5 MG PO TABS
5.0000 mg | ORAL_TABLET | Freq: Every day | ORAL | Status: DC
  Administered 2011-05-19 – 2011-05-24 (×6): 5 mg via ORAL
  Filled 2011-05-19 (×6): qty 1

## 2011-05-19 MED ORDER — PHENOL 1.4 % MT LIQD
1.0000 | OROMUCOSAL | Status: DC | PRN
  Filled 2011-05-19: qty 177

## 2011-05-19 MED ORDER — DEXTROSE 5 % IV SOLN: 1.0000 g | INTRAVENOUS | Status: DC

## 2011-05-19 MED ORDER — INSULIN GLARGINE 100 UNIT/ML ~~LOC~~ SOLN
45.0000 [IU] | SUBCUTANEOUS | Status: DC
  Administered 2011-05-19: 45 [IU] via SUBCUTANEOUS

## 2011-05-19 MED ORDER — POTASSIUM CHLORIDE CRYS ER 20 MEQ PO TBCR
40.0000 meq | EXTENDED_RELEASE_TABLET | Freq: Two times a day (BID) | ORAL | Status: DC
  Administered 2011-05-19 (×2): 40 meq via ORAL
  Filled 2011-05-19 (×4): qty 2

## 2011-05-19 MED ORDER — INSULIN ASPART 100 UNIT/ML ~~LOC~~ SOLN
5.0000 [IU] | Freq: Once | SUBCUTANEOUS | Status: AC
  Administered 2011-05-19: 5 [IU] via SUBCUTANEOUS

## 2011-05-19 MED ORDER — MONTELUKAST SODIUM 10 MG PO TABS
10.0000 mg | ORAL_TABLET | Freq: Every day | ORAL | Status: DC
  Administered 2011-05-19 – 2011-05-24 (×6): 10 mg via ORAL
  Filled 2011-05-19 (×6): qty 1

## 2011-05-19 MED ORDER — ACETAMINOPHEN 650 MG RE SUPP: 650.0000 mg | Freq: Four times a day (QID) | RECTAL | Status: DC | PRN

## 2011-05-19 MED ORDER — ASPIRIN EC 325 MG PO TBEC
325.0000 mg | DELAYED_RELEASE_TABLET | Freq: Every day | ORAL | Status: DC
Start: 2011-05-19 — End: 2011-05-24
  Administered 2011-05-19 – 2011-05-24 (×6): 325 mg via ORAL
  Filled 2011-05-19 (×6): qty 1

## 2011-05-19 MED ORDER — RALOXIFENE HCL 60 MG PO TABS
60.0000 mg | ORAL_TABLET | Freq: Every day | ORAL | Status: DC
  Administered 2011-05-19 – 2011-05-24 (×6): 60 mg via ORAL
  Filled 2011-05-19 (×6): qty 1

## 2011-05-19 MED ORDER — DEXTROSE 5 % IV SOLN
500.0000 mg | INTRAVENOUS | Status: DC
  Administered 2011-05-19 – 2011-05-20 (×2): 500 mg via INTRAVENOUS
  Filled 2011-05-19 (×2): qty 500

## 2011-05-19 MED ORDER — GABAPENTIN 300 MG PO CAPS
300.0000 mg | ORAL_CAPSULE | Freq: Two times a day (BID) | ORAL | Status: DC
  Administered 2011-05-19 – 2011-05-24 (×11): 300 mg via ORAL
  Filled 2011-05-19 (×12): qty 1

## 2011-05-19 MED ORDER — ACETAMINOPHEN 325 MG PO TABS
650.0000 mg | ORAL_TABLET | Freq: Four times a day (QID) | ORAL | Status: DC | PRN
  Administered 2011-05-22 – 2011-05-23 (×3): 650 mg via ORAL
  Filled 2011-05-19 (×3): qty 2

## 2011-05-19 MED ORDER — METOPROLOL TARTRATE 50 MG PO TABS
50.0000 mg | ORAL_TABLET | Freq: Every day | ORAL | Status: DC
  Administered 2011-05-19 – 2011-05-21 (×3): 50 mg via ORAL
  Filled 2011-05-19 (×3): qty 1

## 2011-05-19 MED ORDER — DEXTROSE 5 % IV SOLN: 500.0000 mg | INTRAVENOUS | Status: DC

## 2011-05-19 MED ORDER — METHYLPREDNISOLONE SODIUM SUCC 40 MG IJ SOLR
40.0000 mg | Freq: Three times a day (TID) | INTRAMUSCULAR | Status: DC
Start: 2011-05-19 — End: 2011-05-19
  Filled 2011-05-19: qty 1

## 2011-05-19 MED ORDER — HYDROCHLOROTHIAZIDE 25 MG PO TABS
25.0000 mg | ORAL_TABLET | Freq: Every day | ORAL | Status: DC
  Administered 2011-05-19: 25 mg via ORAL
  Filled 2011-05-19 (×2): qty 1

## 2011-05-19 MED ORDER — TRAMADOL HCL 50 MG PO TABS
50.0000 mg | ORAL_TABLET | Freq: Four times a day (QID) | ORAL | Status: DC | PRN
  Administered 2011-05-19 – 2011-05-23 (×2): 50 mg via ORAL
  Filled 2011-05-19 (×2): qty 1

## 2011-05-19 NOTE — ED Provider Notes (Signed)
History     CSN: [id number]  Arrival date & time 05/18/11  [id number]   First MD Initiated Contact with Patient 05/18/11 2026      Chief Complaint  Patient presents with  . URI  . Headache    (Consider location/radiation/quality/duration/timing/severity/associated sxs/prior treatment) HPI Patient is a 66 year old female with history of asthma as well as diabetes who presents today complaining of 2 weeks of increasing cough as well as sore throat and lethargy. She denies any sick contacts. Patient feels that her asthma may have been worsening due to the pollen. She attributes her sore throat postnasal drip. Patient is normally on 2 L of home O2 as well as CPAP at night. She is maintaining normal oxygen saturation on her home O2 requirement. She has not been hospitalized in the past 90 days. Patient is having wheezing. She denies any peripheral edema. She does endorse shortness of breath that is worse with exertion. She describes chest pain that is constant and worse with coughing. She has attributed this to her respiratory symptoms and says it does not feel similar to problems she has had in the past with CAD.  There are no other associated or modifying factors.  Past Medical History  Diagnosis Date  . Allergy   . Depression   . Diabetes mellitus 1997    Type II   . GERD (gastroesophageal reflux disease)   . Hypertension   . Hyperlipidemia   . Osteoarthritis   . Osteoporosis   . Hair loss   . Carpal tunnel syndrome on left   . Anemia   . CAD (coronary artery disease)     Mild very minimal coronary disease with 20% obtuse marginal stenosis  . CVA (cerebral infarction)   . Diverticulosis   . Esophageal stricture   . Gastritis   . Adenomatous colon polyp   . Sickle cell trait   . Asthma     pt has cpap w/ her today  . COPD (chronic obstructive pulmonary disease)   . 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 Gastroparesis 08/21/2007  . RENAL  INSUFFICIENCY 02/16/2009  . OSTEOARTHRITIS 08/09/2006  . Carpal tunnel syndrome, left   . Morbid obesity   . Hernia, hiatal   . CPAP (continuous positive airway pressure) dependence   . CHF (congestive heart failure)   . Stroke   . Sickle cell anemia   . Seizures     Past Surgical History  Procedure Date  . Abdominal hysterectomy   . Replacement total knee 1998    Left  . Esophagogastroduodenoscopy 06/25/2006  . Breast lumpectomy     benign  . Erd 08/08/2000  . Breast lumpectomy     both breast lumps removed   . Artery biopsy 01/07/2011    Procedure: MINOR BIOPSY TEMPORAL ARTERY;  Surgeon: Haywood Lasso, MD;  Location: [facility];  Service: General;  Laterality: Left;  left temporal artery biopsy    Family History  Problem Relation Age of Onset  . Colon cancer Brother   . Cancer Brother     Colon Cancer  . Cancer Mother     Liver Cancer  . Diabetes Mother   . Kidney disease Mother   . Heart disease Mother     age 107's  . Heart disease Father   . Heart attack Father     died of MI when pt was 58  . Sickle cell anemia Father   . Diabetes Sister   .  Kidney disease Sister   . Heart disease Sister     age 50's  . Allergies Sister   . Diabetes Sister   . Kidney disease Sister   . Heart disease Sister     age 3's    History  Substance Use Topics  . Smoking status: Former Smoker -- 0.5 packs/day    Quit date: 04/18/1980  . Smokeless tobacco: Never Used  . Alcohol Use: No    OB History    Grav Para Term Preterm Abortions TAB SAB Ect Mult Living                  Review of Systems  Constitutional: Positive for chills and fatigue.  HENT: Positive for congestion, sore throat and postnasal drip.   Eyes: Negative.   Respiratory: Positive for cough, chest tightness, shortness of breath and wheezing.   Cardiovascular: Positive for chest pain.  Gastrointestinal: Negative.   Genitourinary: Negative.   Musculoskeletal: Negative.   Skin:  Negative.   Neurological: Negative.   Hematological: Negative.   Psychiatric/Behavioral: Negative.   All other systems reviewed and are negative.    Allergies  Promethazine hcl  Home Medications   Current Outpatient Rx  Name Route Sig Dispense Refill  . ALBUTEROL SULFATE HFA 108 (90 BASE) MCG/ACT IN AERS Inhalation Inhale 2 puffs into the lungs every 6 (six) hours as needed for shortness of breath. 1 Inhaler 6  . AMLODIPINE BESYLATE 5 MG PO TABS Oral Take 1 tablet by mouth daily.    . ASPIRIN 81 MG PO TABS Oral Take 81 mg by mouth daily.      . ATORVASTATIN CALCIUM 40 MG PO TABS Oral Take 1 tablet (40 mg total) by mouth daily. 30 tablet 6  . BUDESONIDE-FORMOTEROL FUMARATE 160-4.5 MCG/ACT IN AERO Inhalation Inhale 2 puffs into the lungs 2 (two) times daily. 1 Inhaler 6  . CALTRATE PLUS PO Oral Take 600 mg by mouth daily.      Marland Kitchen CITALOPRAM HYDROBROMIDE 20 MG PO TABS Oral Take 20 mg by mouth daily.      . CYCLOBENZAPRINE HCL 10 MG PO TABS Oral Take 1 tablet (10 mg total) by mouth daily. 30 tablet 5  . DICLOFENAC SODIUM 75 MG PO TBEC Oral Take 1 tablet by mouth daily.    Marland Kitchen DICYCLOMINE HCL 10 MG PO CAPS Oral Take 1 tablet by mouth Three times a day before meals.    Marland Kitchen DILTIAZEM HCL ER COATED BEADS 240 MG PO CP24 Oral Take 240 mg by mouth daily.      . DONEPEZIL HCL 5 MG PO TABS Oral Take 1 tablet (5 mg total) by mouth daily. 30 tablet 5  . EZETIMIBE 10 MG PO TABS Oral Take 10 mg by mouth daily.      Marland Kitchen FLUCONAZOLE 100 MG PO TABS  as directed.    Marland Kitchen FLUOCINONIDE 0.05 % EX GEL Topical Apply topically 2 (two) times daily.      Marland Kitchen FLUTICASONE PROPIONATE 50 MCG/ACT NA SUSP Nasal Place 2 sprays into the nose daily. 16 g 11  . FUROSEMIDE 80 MG PO TABS Oral Take 80 mg by mouth 2 (two) times daily.      Marland Kitchen GABAPENTIN 300 MG PO CAPS Oral Take 1 capsule (300 mg total) by mouth 2 (two) times daily. 60 capsule 5  . HYDRALAZINE HCL 10 MG PO TABS Oral Take 10 mg by mouth 3 (three) times daily.      .  INSULIN GLARGINE 100 UNIT/ML  [place] SOLN Subcutaneous Inject 45 Units into the skin every morning. And pen needles 1/day    . INSULIN ISOPHANE HUMAN 100 UNIT/ML [place] SUSP Subcutaneous Inject 45 Units into the skin every morning. 2 vial 11  . INSULIN SYRINGE-NEEDLE U-100 31G X 5/16" 1 ML MISC  Use as directecd to inject insulin 100 each 5  . LOSARTAN POTASSIUM-HCTZ 100-25 MG PO TABS Oral Take 1 tablet by mouth daily.    . MELOXICAM 15 MG PO TABS Oral Take 1 tablet by mouth as needed.    Marland Kitchen METFORMIN HCL 500 MG PO TABS Oral Take 1 tablet by mouth Twice daily.    Marland Kitchen METOCLOPRAMIDE HCL 10 MG PO TABS Oral Take 10 mg by mouth 4 (four) times daily.      Marland Kitchen METOPROLOL TARTRATE 50 MG PO TABS Oral Take 50 mg by mouth daily.     . MULTIVITAMIN PO Oral Take 1 tablet by mouth daily.      Marland Kitchen OMEPRAZOLE 20 MG PO CPDR Oral Take 40 mg by mouth daily.     Marland Kitchen POTASSIUM CHLORIDE CRYS ER 20 MEQ PO TBCR Oral Take 40 mEq by mouth 2 (two) times daily.      Marland Kitchen RALOXIFENE HCL 60 MG PO TABS Oral Take 60 mg by mouth daily.      Marland Kitchen SINGULAIR 10 MG PO TABS Oral Take 1 tablet by mouth daily.    . TRAMADOL HCL 50 MG PO TBSO Oral Take 1 tablet by mouth 4 (four) times daily as needed.        BP 142/56  Pulse 103  Temp(Src) 99.6 F (37.6 C) (Oral)  Resp 20  SpO2 100%  Physical Exam  Nursing note and vitals reviewed. GEN: Well-developed, well-nourished female in no distress HEENT: Atraumatic, normocephalic. Posterior oropharyngeal erythema without exudate  EYES: PERRLA BL, no scleral icterus. NECK: Trachea midline, no meningismus CV: regular rate and rhythm. No murmurs, rubs, or gallops PULM: No respiratory distress.  Diffuse wheezing throughout.  GI: soft, non-tender. No guarding, rebound, or tenderness. + bowel sounds  GU: deferred Neuro: cranial nerves 2-12 intact, no abnormalities of strength or sensation, A and O x 3 MSK: Patient moves all 4 extremities symmetrically, no deformity, edema, or injury noted Skin: No rashes  petechiae, purpura, or jaundice Psych: no abnormality of mood   ED Course  Procedures (including critical care time)   Date: 05/19/2011  Rate: 100  Rhythm: sinus tachycardia  QRS Axis: normal  Intervals: normal  ST/T Wave abnormalities: nonspecific T wave changes  Conduction Disutrbances:none  Narrative Interpretation:   Old EKG Reviewed: none available   Labs Reviewed  PRO B NATRIURETIC PEPTIDE - Abnormal; Notable for the following:    Pro B Natriuretic peptide (BNP) 1703.0 (*)    All other components within normal limits  CBC - Abnormal; Notable for the following:    WBC 17.3 (*)    Hemoglobin 11.5 (*)    HCT 35.2 (*)    MCV 71.4 (*)    MCH 23.3 (*)    All other components within normal limits  COMPREHENSIVE METABOLIC PANEL - Abnormal; Notable for the following:    Glucose, Bld 220 (*)    Creatinine, Ser 1.23 (*)    Albumin 3.3 (*)    Alkaline Phosphatase 118 (*)    GFR calc non Af Amer 45 (*)    GFR calc Af Amer 52 (*)    All other components within normal limits  POCT I-STAT TROPONIN I  URINALYSIS,  ROUTINE W REFLEX MICROSCOPIC   Dg Chest 2 View  05/18/2011  *RADIOLOGY REPORT*  Clinical Data: Productive cough.  Hypertension.  Diabetes.  History of congestive heart failure.  CHEST - 2 VIEW  Comparison: 07/04/2010 and previous  Findings: Heart size is normal.  The aorta is unfolded.  There is bronchial thickening.  There may be minimal patchy density at the right lung base.  No definite pneumonia.  No consolidation or major collapse.  No effusions.  No significant bony finding.  No evidence of heart failure.  IMPRESSION: Bronchitis.  Question minimal patchy infiltrate at the right base. This is not definite.  This could represent mild pneumonia.  Original Report Authenticated By: Jules Schick, M.D.     1. Asthma exacerbation   2. CAP (community acquired pneumonia)       MDM  Patient was evaluated by myself. Initially patient expressed that she would like to be  discharged home if possible. Patient was written for oral Avelox and steroids as well as breathing treatments. Patient told me she would be unable to take these orally because of her sore throat. I the conversion was ordered. Patient had workup for possible causes of her chest pain symptoms as well. Patient was hypertensive on presentation but has not been taking any of her home medications. She did have significant improvement with breathing treatments but continued to have diffuse wheezing. Patient had a white count of 17.3 with no anemia. Renal panel indicated that she had dehydration without acute renal insufficiency. Patient was given a 500 cc normal saline IV bolus. The patient had an elevated BNP her last previous value was from 2011 and patient clinically appeared to have presentation were consistent with community-acquired pneumonia and asthma exacerbation. Patient was admitted to the hospitalist for further management.        Chauncy Passy, MD 05/19/11 Laureen Abrahams

## 2011-05-19 NOTE — Progress Notes (Signed)
Pt placed on cpap 9 cmH2O with 2L O2 at this time. Pt given a full face mask because she wears this at home.  Kathie Dike RRT

## 2011-05-19 NOTE — Progress Notes (Signed)
She remains awake, alert and oriented x 4 with clear speech.  She is eupneic and her skin is normal, warm and dry.  She is able to drink the sweet liquids we give her and states she will be able to eat supper.  Her daughter and two grandsons are visiting her at this time.  She has just had a cbg in the 50's; but I do not foresee the need to give IV dextrose--will monitor.

## 2011-05-19 NOTE — ED Notes (Signed)
MD at bedside. 

## 2011-05-19 NOTE — Progress Notes (Signed)
Pt placed on CPAP at 9 CMH2O via FFM, Pt tolerating well at this time, RT to monitor and assess as needed

## 2011-05-19 NOTE — ED Notes (Signed)
Attempted to collect clean catch urine specimen but unsuccessful at this time.  Pt unable to void.

## 2011-05-19 NOTE — H&P (Addendum)
Patient's PCP: No primary provider on file., patient reports that she is in the process of establishing care with [facility]. Patient's pulmonologist: Dr. Halford Chessman. Patient's gastroenterologist: Dr. Deatra Ina.  Chief Complaint: Shortness of breath  History of Present Illness: Joann Gutierrez is a 66 y.o. African American female with history of depression, type 2 diabetes, GERD, hypertension, hyperlipidemia, coronary disease, obstructive sleep apnea on CPAP, asthma, rhinitis with postnasal drip, obesity presents with the above complaints.  Reports that her symptoms started approximately 3 days ago with cough productive for green sputum, shortness of breath, with increasing wheezing.  She also reports having chest pain and abdominal pain with cough.  Given her breathing was not improving she presented to the emergency department for further evaluation.  Chest x-ray showed possible pneumonia versus bronchitis as a result hospitalist service was asked to admit the patient for further evaluation.  Patient denies any recent fevers, did have some chills. Denies any headaches or vision changes.  Past Medical History  Diagnosis Date  . Allergy   . Depression   . Diabetes mellitus 1997    Type II   . GERD (gastroesophageal reflux disease)   . Hypertension   . Hyperlipidemia   . Osteoarthritis   . Osteoporosis   . Hair loss   . Carpal tunnel syndrome on left   . Anemia   . CAD (coronary artery disease)     Mild very minimal coronary disease with 20% obtuse marginal stenosis  . CVA (cerebral infarction)   . Diverticulosis   . Esophageal stricture   . Gastritis   . Adenomatous colon polyp   . Sickle cell trait   . Asthma     pt has cpap w/ her today  . COPD (chronic obstructive pulmonary disease)   . 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 Gastroparesis 08/21/2007  . RENAL INSUFFICIENCY 02/16/2009  . OSTEOARTHRITIS 08/09/2006  .  Carpal tunnel syndrome, left   . Morbid obesity   . Hernia, hiatal   . CPAP (continuous positive airway pressure) dependence   . CHF (congestive heart failure)   . Stroke   . Sickle cell anemia   . Seizures    Past Surgical History  Procedure Date  . Abdominal hysterectomy   . Replacement total knee 1998    Left  . Esophagogastroduodenoscopy 06/25/2006  . Breast lumpectomy     benign  . Erd 08/08/2000  . Breast lumpectomy     both breast lumps removed   . Artery biopsy 01/07/2011    Procedure: MINOR BIOPSY TEMPORAL ARTERY;  Surgeon: Haywood Lasso, MD;  Location: [facility];  Service: General;  Laterality: Left;  left temporal artery biopsy   Family History  Problem Relation Age of Onset  . Colon cancer Brother   . Cancer Brother     Colon Cancer  . Cancer Mother     Liver Cancer  . Diabetes Mother   . Kidney disease Mother   . Heart disease Mother     age 54's  . Heart disease Father   . Heart attack Father     died of MI when pt was 60  . Sickle cell anemia Father   . Diabetes Sister   . Kidney disease Sister   . Heart disease Sister     age 62's  . Allergies Sister   . Diabetes Sister   . Kidney disease Sister   . Heart disease Sister  age 29's   History   Social History  . Marital Status: Widowed    Spouse Name: N/A    Number of Children: 1  . Years of Education: N/A   Occupational History  . Disabled    Social History Main Topics  . Smoking status: Former Smoker -- 0.5 packs/day    Quit date: 04/18/1980  . Smokeless tobacco: Never Used  . Alcohol Use: No  . Drug Use: No  . Sexually Active: Not on file   Other Topics Concern  . Not on file   Social History Narrative   Previously worked as a Electrical engineer.Daily Caffeine Use-Coffee and Tea   Allergies: Promethazine hcl  Meds: Scheduled Meds:   . acetaminophen  650 mg Oral Once  . albuterol  2.5 mg Nebulization Once  . albuterol  2.5 mg Nebulization Once  .  albuterol  2.5 mg Nebulization Once  . albuterol  10 mg/hr Nebulization Once  . aspirin  324 mg Oral Once  . ipratropium  0.5 mg Nebulization Once  . methylPREDNISolone (SOLU-MEDROL) injection  125 mg Intravenous Once  . moxifloxacin  400 mg Intravenous Once  . sodium chloride  500 mL Intravenous Once  . DISCONTD: ipratropium  0.5 mg Nebulization Once  . DISCONTD: moxifloxacin  400 mg Oral Once  . DISCONTD: predniSONE  60 mg Oral Once   Continuous Infusions:  PRN Meds:.  Review of Systems: All systems reviewed with the patient and positive as per history of present illness, otherwise all other systems are negative.  Physical Exam: Blood pressure 142/56, pulse 103, temperature 99.6 F (37.6 C), temperature source Oral, resp. rate 20, SpO2 100.00%. General: Awake, Oriented x3, in some respiratory distress. HEENT: EOMI, Moist mucous membranes Neck: Supple CV: S1 and S2 Lungs: Coarse breath sounds bilaterally with scattered wheezing. Abdomen: Soft, Nontender, Nondistended, +bowel sounds. Ext: Good pulses. Trace edema. No clubbing or cyanosis noted. Neuro: Cranial Nerves II-XII grossly intact. Has 5/5 motor strength in upper and lower extremities.   Lab results:  [facility] 05/18/11 2227  NA 141  K 3.9  CL 103  CO2 30  GLUCOSE 220*  BUN 19  CREATININE 1.23*  CALCIUM 10.0  MG --  PHOS --    Basename 05/18/11 2227  AST 20  ALT 12  ALKPHOS 118*  BILITOT 0.5  PROT 8.2  ALBUMIN 3.3*   No results found for this basename: LIPASE:2,AMYLASE:2 in the last 72 hours  Basename 05/18/11 2227  WBC 17.3*  NEUTROABS --  HGB 11.5*  HCT 35.2*  MCV 71.4*  PLT 252   No results found for this basename: CKTOTAL:3,CKMB:3,CKMBINDEX:3,TROPONINI:3 in the last 72 hours No components found with this basename: POCBNP:3 No results found for this basename: DDIMER in the last 72 hours No results found for this basename: HGBA1C:2 in the last 72 hours No results found for this basename:  CHOL:2,HDL:2,LDLCALC:2,TRIG:2,CHOLHDL:2,LDLDIRECT:2 in the last 72 hours No results found for this basename: TSH,T4TOTAL,FREET3,T3FREE,THYROIDAB in the last 72 hours No results found for this basename: VITAMINB12:2,FOLATE:2,FERRITIN:2,TIBC:2,IRON:2,RETICCTPCT:2 in the last 72 hours Imaging results:  Dg Chest 2 View  05/18/2011  *RADIOLOGY REPORT*  Clinical Data: Productive cough.  Hypertension.  Diabetes.  History of congestive heart failure.  CHEST - 2 VIEW  Comparison: 07/04/2010 and previous  Findings: Heart size is normal.  The aorta is unfolded.  There is bronchial thickening.  There may be minimal patchy density at the right lung base.  No definite pneumonia.  No consolidation or major collapse.  No  effusions.  No significant bony finding.  No evidence of heart failure.  IMPRESSION: Bronchitis.  Question minimal patchy infiltrate at the right base. This is not definite.  This could represent mild pneumonia.  Original Report Authenticated By: Jules Schick, M.D.   Other results: EKG: sinus tachycardia.  Assessment & Plan by Problem: Acute-on-chronic respiratory failure Likely due to bronchitis versus underlying pneumonia.  Start ceftriaxone and azithromycin.  Change antibiotics depending on patient's clinical course.  Send Influenza PCR.  Sent for chest x-ray in the morning.  Chest pain and abdominal pain Likely musculoskeletal from cough.  Trend troponins.  EKG shows sinus tachycardia.  Reactive airway disease/asthma exacerbation Steroids.  Continue home inhalers at this time.  Continue neb treatments.  If improved on IV steroids transitioned to oral.  Sore throat Posterior oropharynx clear.  Continue to monitor.  Type 2 diabetes uncontrolled with complications Continue home Lantus regimen.  Moderate sliding scale insulin.  Obstructive sleep apnea Continue CPAP.  Tachycardia Likely due to neb treatments.  Leukocytosis Likely due to bronchitis versus underlying  pneumonia.  GERD Continue PPI.  History of dysphasia Continue to monitor.  Stable.  Anemia Send for anemia panel in the morning.  Chronic kidney disease stage III Creatinine at baseline.  Hypercholesterolemia Continue home medications.  Hypertension Continue home medications.  Prophylaxis Lovenox.  CODE STATUS Full code.  Time spent on admission, talking to the patient, and coordinating care was: 60 mins.  Annaclaire Walsworth A, MD 05/19/2011, 12:55 AM

## 2011-05-19 NOTE — Progress Notes (Signed)
Patient seen and examined; laboratory data and H&P reviewed. Will continue treatment with steroids, antibiotics and nebulizer treatment. DM uncontrolled. Will adjust her insulin, follow CBG and A1C. For her OSA will continue CPAP QHS.  Patient VSS and she is currently no wheezing or having any acute complaints.  [place] , [place]

## 2011-05-20 DIAGNOSIS — J209 Acute bronchitis, unspecified: Secondary | ICD-10-CM | POA: Diagnosis not present

## 2011-05-20 DIAGNOSIS — E1165 Type 2 diabetes mellitus with hyperglycemia: Secondary | ICD-10-CM | POA: Diagnosis not present

## 2011-05-20 DIAGNOSIS — J9620 Acute and chronic respiratory failure, unspecified whether with hypoxia or hypercapnia: Secondary | ICD-10-CM | POA: Diagnosis not present

## 2011-05-20 LAB — GLUCOSE, CAPILLARY

## 2011-05-20 LAB — URINALYSIS, ROUTINE W REFLEX MICROSCOPIC
Glucose, UA: >1000 mg/dL — AB
Leukocytes, UA: NEGATIVE
Protein, ur: 100 mg/dL — AB
pH: 6 (ref 5.0–8.0)

## 2011-05-20 LAB — BASIC METABOLIC PANEL
CO2: 23 mEq/L (ref 19–32)
Chloride: 101 mEq/L (ref 96–112)
Creatinine, Ser: 3.37 mg/dL — ABNORMAL HIGH (ref 0.50–1.10)
GFR calc Af Amer: 15 mL/min — ABNORMAL LOW (ref >90)
Potassium: 5.2 mEq/L — ABNORMAL HIGH (ref 3.5–5.1)
Sodium: 135 mEq/L (ref 135–145)

## 2011-05-20 LAB — URINE MICROSCOPIC-ADD ON

## 2011-05-20 LAB — CBC
MCV: 72.6 fL — ABNORMAL LOW (ref 78.0–100.0)
Platelets: 240 10*3/uL (ref 150–400)
RBC: 4.42 MIL/uL (ref 3.87–5.11)
RDW: 14.3 % (ref 11.5–15.5)
WBC: 15.1 10*3/uL — ABNORMAL HIGH (ref 4.0–10.5)

## 2011-05-20 MED ORDER — PREDNISONE 50 MG PO TABS
60.0000 mg | ORAL_TABLET | Freq: Every day | ORAL | Status: DC
  Administered 2011-05-21 – 2011-05-22 (×2): 60 mg via ORAL
  Filled 2011-05-20 (×3): qty 1

## 2011-05-20 MED ORDER — SODIUM CHLORIDE 0.9 % IV SOLN
INTRAVENOUS | Status: DC
  Administered 2011-05-20 (×2): via INTRAVENOUS

## 2011-05-20 MED ORDER — FUROSEMIDE 80 MG PO TABS
80.0000 mg | ORAL_TABLET | Freq: Every day | ORAL | Status: DC
  Administered 2011-05-21 – 2011-05-24 (×4): 80 mg via ORAL
  Filled 2011-05-20 (×4): qty 1

## 2011-05-20 MED ORDER — HEPARIN SODIUM (PORCINE) 5000 UNIT/ML IJ SOLN
5000.0000 [IU] | Freq: Three times a day (TID) | INTRAMUSCULAR | Status: DC
  Administered 2011-05-20 – 2011-05-24 (×13): 5000 [IU] via SUBCUTANEOUS
  Filled 2011-05-20 (×15): qty 1

## 2011-05-20 MED ORDER — SODIUM CHLORIDE 0.9 % IV BOLUS (SEPSIS)
250.0000 mL | Freq: Once | INTRAVENOUS | Status: AC
  Administered 2011-05-20: 250 mL via INTRAVENOUS

## 2011-05-20 MED ORDER — MOXIFLOXACIN HCL 400 MG PO TABS
400.0000 mg | ORAL_TABLET | Freq: Every day | ORAL | Status: DC
  Administered 2011-05-21 – 2011-05-23 (×3): 400 mg via ORAL
  Filled 2011-05-20 (×4): qty 1

## 2011-05-20 MED ORDER — METHYLPREDNISOLONE SODIUM SUCC 40 MG IJ SOLR
40.0000 mg | Freq: Two times a day (BID) | INTRAMUSCULAR | Status: DC
  Filled 2011-05-20 (×2): qty 1

## 2011-05-20 MED ORDER — POTASSIUM CHLORIDE CRYS ER 20 MEQ PO TBCR
40.0000 meq | EXTENDED_RELEASE_TABLET | Freq: Every day | ORAL | Status: DC
  Administered 2011-05-20 – 2011-05-21 (×2): 40 meq via ORAL
  Filled 2011-05-20 (×2): qty 2

## 2011-05-20 NOTE — Progress Notes (Addendum)
Subjective: Breathing significantly improved and currently no wheezing. Patient reports decreased urine output and also swelling in her LE. No nausea, vomiting, CP or SOB.  Objective: Vital signs in last 24 hours: Temp:  [97.2 F (36.2 C)-98 F (36.7 C)] 97.6 F (36.4 C) (04/29 1518) Pulse Rate:  [52-71] 52  (04/29 1518) Resp:  [16-20] 16  (04/29 1518) BP: (105-135)/(58-75) 105/60 mmHg (04/29 1518) SpO2:  [93 %-96 %] 96 % (04/29 1518) Weight change:  Last BM Date: 05/18/11  Intake/Output from previous day: 04/28 0701 - 04/29 0700 In: 1020 [P.O.:720; IV Piggyback:300] Out: -  Total I/O In: 690 [P.O.:240; I.V.:450] Out: 200 [Urine:200]   Physical Exam: General: Alert, awake, oriented x3, in no acute distress. HEENT: No bruits, no goiter. Heart: Regular rate and rhythm, without murmurs, rubs, gallops. Lungs: good air movement, no wheezing. Abdomen: Soft, nontender, nondistended, positive bowel sounds. Extremities: No clubbing or cyanosis; trace edema with positive pedal pulses. Neuro: Grossly intact, nonfocal.   Lab Results: Basic Metabolic Panel:  Basename 05/20/11 0508 05/19/11 0231  NA 135 140  K 5.2* 3.3*  CL 101 102  CO2 23 23  GLUCOSE 217* 337*  BUN 49* 22  CREATININE 3.37* 1.59*  CALCIUM 8.7 8.9  MG -- --  PHOS -- --   Liver Function Tests:  [facility] 05/18/11 2227  AST 20  ALT 12  ALKPHOS 118*  BILITOT 0.5  PROT 8.2  ALBUMIN 3.3*   CBC:  Basename 05/20/11 0508 05/19/11 0231  WBC 15.1* 14.8*  NEUTROABS -- --  HGB 10.1* 10.0*  HCT 32.1* 30.8*  MCV 72.6* 71.5*  PLT 240 245   Cardiac Enzymes:  Basename 05/19/11 1318 05/19/11 0737 05/19/11 0231  CKTOTAL 760* 626* 664*  CKMB 3.7 3.0 2.8  CKMBINDEX -- -- --  TROPONINI <0.30 <0.30 <0.30   BNP:  Basename 05/18/11 2227  PROBNP 1703.0*   CBG:  Basename 05/20/11 1219 05/20/11 0731 05/19/11 2134 05/19/11 1808 05/19/11 1656 05/19/11 1117  GLUCAP 181* 213* 188* 96 58* 338*   Hemoglobin  A1C:  Basename 05/19/11 0231  HGBA1C 11.3*   Anemia Panel:  Basename 05/19/11 0231  VITAMINB12 612  FOLATE >20.0  FERRITIN 246  TIBC 213*  IRON 19*  RETICCTPCT 1.7   Urinalysis:  Basename 05/20/11 0621  COLORURINE YELLOW  LABSPEC 1.028  PHURINE 6.0  GLUCOSEU >1000*  HGBUR NEGATIVE  BILIRUBINUR NEGATIVE  KETONESUR TRACE*  PROTEINUR 100*  UROBILINOGEN 0.2  NITRITE NEGATIVE  LEUKOCYTESUR NEGATIVE   Studies/Results: Dg Chest 2 View  05/18/2011  *RADIOLOGY REPORT*  Clinical Data: Productive cough.  Hypertension.  Diabetes.  History of congestive heart failure.  CHEST - 2 VIEW  Comparison: 07/04/2010 and previous  Findings: Heart size is normal.  The aorta is unfolded.  There is bronchial thickening.  There may be minimal patchy density at the right lung base.  No definite pneumonia.  No consolidation or major collapse.  No effusions.  No significant bony finding.  No evidence of heart failure.  IMPRESSION: Bronchitis.  Question minimal patchy infiltrate at the right base. This is not definite.  This could represent mild pneumonia.  Original Report Authenticated By: Jules Schick, M.D.   Dg Chest Port 1 View  05/19/2011  *RADIOLOGY REPORT*  Clinical Data: Pneumonia.  Shortness of breath  PORTABLE CHEST - 1 VIEW  Comparison: 05/18/2011  Findings: Heart size appears mildly enlarged.  No pleural effusion or edema identified.  There is no airspace consolidation identified.  Lung volumes are low  and there is slight asymmetric elevation of the right hemidiaphragm.  IMPRESSION:  1.  Mild cardiac enlargement. 2.  Low lung volumes and asymmetric elevation of the right hemidiaphragm.  Original Report Authenticated By: Angelita Ingles, M.D.    Medications: Scheduled Meds:   . albuterol  2.5 mg Nebulization Once  . amLODipine  5 mg Oral Daily  . aspirin EC  325 mg Oral Daily  . atorvastatin  40 mg Oral Daily  . azithromycin  500 mg Intravenous Q24H  . cefTRIAXone (ROCEPHIN)  IV  1 g  Intravenous Q24H  . citalopram  20 mg Oral Daily  . cyclobenzaprine  10 mg Oral Daily  . diclofenac  75 mg Oral Daily  . dicyclomine  10 mg Oral TID AC  . diltiazem  240 mg Oral Daily  . donepezil  5 mg Oral Daily  . ezetimibe  10 mg Oral Daily  . fluticasone  2 spray Each Nare Daily  . furosemide  80 mg Oral Daily  . gabapentin  300 mg Oral BID  . heparin subcutaneous  5,000 Units Subcutaneous Q8H  . hydrALAZINE  10 mg Oral TID  . insulin aspart  0-20 Units Subcutaneous TID WC  . insulin glargine  45 Units Subcutaneous BID  . methylPREDNISolone (SOLU-MEDROL) injection  40 mg Intravenous Q12H  . metoCLOPramide  10 mg Oral QID  . metoprolol  50 mg Oral Daily  . montelukast  10 mg Oral Daily  . pantoprazole  80 mg Oral BID AC  . potassium chloride SA  40 mEq Oral Daily  . raloxifene  60 mg Oral Daily  . sodium chloride  250 mL Intravenous Once  . DISCONTD: enoxaparin  40 mg Subcutaneous Q24H  . DISCONTD: furosemide  80 mg Oral BID  . DISCONTD: hydrochlorothiazide  25 mg Oral Daily  . DISCONTD: insulin aspart  3 Units Subcutaneous TID WC  . DISCONTD: insulin glargine  60 Units Subcutaneous BID  . DISCONTD: losartan  100 mg Oral Daily  . DISCONTD: methylPREDNISolone (SOLU-MEDROL) injection  40 mg Intravenous Q8H  . DISCONTD: pantoprazole  80 mg Oral Q1200  . DISCONTD: potassium chloride SA  40 mEq Oral BID   Continuous Infusions:   . sodium chloride 75 mL/hr at 05/20/11 1500   PRN Meds:.acetaminophen, acetaminophen, morphine, ondansetron (ZOFRAN) IV, ondansetron, phenol, traMADol  Assessment/Plan: 1-Acute-on-chronic respiratory failure: improving. Will start tapering steroids; change antibiotics to PO and continue inhaler treatment and nebulizer therapy.Marland Kitchen  2-DIABETES MELLITUS, TYPE II: continue SSI and lantus BID. A1C 11.3  3-HYPERCHOLESTEROLEMIA: continue statins.  4-OBSTRUCTIVE SLEEP APNEA: continue CPAP  5-PERIPHERAL NEUROPATHY, FEET: continue Neurontin.  6-ALLERGIC  RHINITIS: continue fluticasone and singulair.  7-Chronic obstructive asthma: continue treatment as mentioned above.  8-GERD (gastroesophageal reflux disease): continue PPI>  9-Bronchitis: continue antibiotics  10-acute on CKD (chronic kidney disease), stage III: will hold HCTZ, adjust lasix and hold ARB. Will give IVF's.  [id number] diastolic heart failure: will repeat 2-D echo, daily weight and strict I's and O's.  12-DVT: heparin.    LOS: 2 days   Shyler Hamill [facility] [phone]  05/20/2011, 3:37 PM

## 2011-05-20 NOTE — Evaluation (Signed)
Occupational Therapy Evaluation Patient Details Name: NICOSHA BRIDWELL MRN: [id number] DOB: 06-15-1945 Today's Date: 05/20/2011 Time: [id number] OT Time Calculation (min): 26 min  OT Assessment / Plan / Recommendation Clinical Impression  This 66 year old female was admitted with SOB/respiratory failure.  She has a h/o depressiona, HTN, CAD, CVA and sickle cell anemia.  At baseline, she is independent with BADLs and uses a CPAP at night.  Her friend is with her most of the time and performs IADLs.  Pt is near baseline.  OT will follow her in acute to ensure safety with bathroom transfers.      OT Assessment  Patient needs continued OT Services    Follow Up Recommendations  No OT follow up    Equipment Recommendations  Other (comment) (discussed tub set with pt--doesn't want at this time)    Frequency Min 2X/week    Precautions / Restrictions Precautions Precautions: Fall Restrictions Weight Bearing Restrictions: No   Pertinent Vitals/Pain Dyspnea 2/4; sats 95 -99% with 2 liters 02    ADL  Eating/Feeding: Simulated;Independent Where Assessed - Eating/Feeding: Chair Grooming: Simulated;Set up Where Assessed - Grooming: Unsupported sitting Upper Body Bathing: Performed;Set up Where Assessed - Upper Body Bathing: Sitting, chair;Unsupported Lower Body Bathing: Performed;Set up Where Assessed - Lower Body Bathing: Sit to stand from chair Upper Body Dressing: Performed;Minimal assistance;Other (comment) (lines) Where Assessed - Upper Body Dressing: Sitting, chair;Unsupported Lower Body Dressing: Performed;Minimal assistance;Other (comment) (difficulty with socks:  has seen sock aid:  friend will help) Where Assessed - Lower Body Dressing: Sit to stand from chair Toilet Transfer: Not assessed Toileting - Clothing Manipulation: Simulated;Independent Where Assessed - Toileting Clothing Manipulation: Standing Toileting - Hygiene: Simulated;Independent Where Assessed - Toileting  Hygiene: Standing Tub/Shower Transfer: Not assessed ADL Comments: donned underwear and socks; has assist as needed.  Did not walk into bathroom today. Dyspnea 2/4 reviewed pursed lip breathing and energy conservation    OT Goals Acute Rehab OT Goals OT Goal Formulation: With patient Time For Goal Achievement: 05/31/11 Potential to Achieve Goals: Good ADL Goals Pt Will Transfer to Toilet: with modified independence;Ambulation;3-in-1 ADL Goal: Toilet Transfer - Progress: Goal set today Pt Will Perform Tub/Shower Transfer: Tub transfer;with supervision;Ambulation ADL Goal: Tub/Shower Transfer - Progress: Goal set today Miscellaneous OT Goals Miscellaneous OT Goal #1: Pt will verbalize 3 energy conservation techniques  Visit Information  Last OT Received On: 05/20/11 Assistance Needed: +1    Subjective Data  Subjective: "I have that friend (who left room) who cooks and does anything to help me" Patient Stated Goal: home   Prior [place] Lives With: Other (Comment) (has friend who is there most of the time) Bathroom Shower/Tub: Chiropodist: Handicapped height Prior Function Level of Independence: Independent (leans back on bed to get socks on) Communication Communication: No difficulties Dominant Hand: Right    Cognition  Overall Cognitive Status: Appears within functional limits for tasks assessed/performed Arousal/Alertness: Awake/alert Orientation Level: Appears intact for tasks assessed Behavior During Session: [facility] for tasks performed    Extremity/Trunk Assessment Right Upper Extremity Assessment RUE ROM/Strength/Tone: Within functional levels Left Upper Extremity Assessment LUE ROM/Strength/Tone: Within functional levels   Mobility Transfers Transfers: Sit to Stand Sit to Stand: 7: Independent   Exercise    Balance Balance Balance Assessed: Yes Dynamic Standing Balance Dynamic Standing - Balance Support:  (supervision level  dynamic balance during adls)  End of Session OT - End of Session Activity Tolerance: Other (comment);Treatment limited secondary to medical complications (  Comment) (needed rest breaks for dyspnea) Patient left: in chair;with call bell/phone within reach;with family/visitor present  Lesle Chris, OTR/L [id number] 05/20/2011 Harshita Bernales 05/20/2011, 1:21 PM

## 2011-05-20 NOTE — Progress Notes (Signed)
Pt placed on CPAP for the night at current setting of 9 cmH2O.  Pt has 2 L/M O2 bled-in and is tolerating well.  Pt encouraged to notify RN/ RT of any concerns.

## 2011-05-20 NOTE — Progress Notes (Signed)
Fredirick Maudlin floor coverage for [facility] was notified this morning in reference to patients morning labs. Creatinine, BUN, and GFR were all significantly elevated this morning. Patients urinary output was 250cc last 12 hours. Patient stated that she had not actually voided since yesterday morning. Patients IV fluid were stopped yesterday and she is only receiving IV antibiotics. She has noted edema to BLE and hands. She does have history of renal insufficiency and CHF. Patients vital signs are within normal limits and lungs are only slightly diminished. She will receive on 250cc normal saline boluse per MD order and urinary output will along with vital signs will continue to be monitored.

## 2011-05-20 NOTE — Evaluation (Signed)
Physical Therapy Evaluation Patient Details Name: Joann Gutierrez MRN: [id number] DOB: [birth year] Today's Date: 05/20/2011 Time: [id number] PT Time Calculation (min): 10 min  PT Assessment / Plan / Recommendation Clinical Impression  Pt presents with diagnosis of acute on chronic respiratory failure. O2 sats: 96% on RA at rest, and 90% on RA after ambulation. Dyspnea 3-4/4 with activity on RA. Pt will benefit from skilled PT to maximize activity tolerance and independence in preparation for d/c home with friend assisting PRN.    PT Assessment  Patient needs continued PT services    Follow Up Recommendations  Home health PT;Supervision - Intermittent    Equipment Recommendations  None recommended by PT    Frequency Min 3X/week    Precautions / Restrictions Precautions Precautions: Fall Restrictions Weight Bearing Restrictions: No   Pertinent Vitals/Pain       Mobility  Bed Mobility Bed Mobility: Supine to Sit;Sit to Supine Supine to Sit: 6: Modified independent (Device/Increase time);HOB elevated;With rails Sit to Supine: 6: Modified independent (Device/Increase time);HOB elevated Transfers Transfers: Sit to Stand;Stand to Sit Sit to Stand: 4: Min guard;With upper extremity assist;From bed Stand to Sit: 5: Supervision;With upper extremity assist;To bed Details for Transfer Assistance: VCs safety. Pt slightly usnteady with initial standing.  Ambulation/Gait Ambulation/Gait Assistance: 4: Min assist Ambulation Distance (Feet): 60 Feet (pushing IV pole) Assistive device: None Ambulation/Gait Assistance Details: Assist to stabilize. Pt unsteady with increased lateral sway. Intermittent stumbling.  Gait Pattern: Decreased stride length;Step-through pattern;Wide base of support    Exercises     PT Goals Acute Rehab PT Goals PT Goal Formulation: With patient Time For Goal Achievement: 05/27/11 Potential to Achieve Goals: Good Pt will go Sit to Stand: with modified  independence PT Goal: Sit to Stand - Progress: Goal set today Pt will go Stand to Sit: with modified independence PT Goal: Stand to Sit - Progress: Goal set today Pt will Ambulate: 51 - 150 feet;with modified independence;with least restrictive assistive device PT Goal: Ambulate - Progress: Goal set today  Visit Information  Last PT Received On: 05/20/11 Assistance Needed: +1    Subjective Data  Subjective: "I'm okay. I just get so tired" Patient Stated Goal: Home with "friend" helping.    Prior Functioning  Home Living Lives With: Other (Comment) (has friend who is there most of the time) Bathroom Shower/Tub: Chiropodist: Handicapped height Prior Function Level of Independence: Independent (leans back on bed to get socks on) Communication Communication: No difficulties Dominant Hand: Right    Cognition  Overall Cognitive Status: Appears within functional limits for tasks assessed/performed Arousal/Alertness: Awake/alert Orientation Level: Appears intact for tasks assessed Behavior During Session: [facility] for tasks performed    Extremity/Trunk Assessment Right Upper Extremity Assessment RUE ROM/Strength/Tone: Within functional levels Left Upper Extremity Assessment LUE ROM/Strength/Tone: Within functional levels Right Lower Extremity Assessment RLE ROM/Strength/Tone: Deficits RLE ROM/Strength/Tone Deficits: Strength at least 4/5 with functional activity Left Lower Extremity Assessment LLE ROM/Strength/Tone: Deficits LLE ROM/Strength/Tone Deficits: Strength at least 4/5 with functional activity   Balance Balance Balance Assessed: Yes Dynamic Standing Balance Dynamic Standing - Balance Support:  (supervision level dynamic balance during adls)  End of Session PT - End of Session Equipment Utilized During Treatment: Gait belt Activity Tolerance: Patient limited by fatigue (Limited by dyspnea) Patient left: in bed;with call bell/phone within reach;with  family/visitor present   Weston Anna [facility] 05/20/2011, 3:56 PM [phone]

## 2011-05-20 NOTE — Progress Notes (Signed)
Inpatient Diabetes Program Recommendations  AACE/ADA: New Consensus Statement on Inpatient Glycemic Control (2009)  Target Ranges:  Prepandial:   less than 140 mg/dL      Peak postprandial:   less than 180 mg/dL (1-2 hours)      Critically ill patients:  140 - 180 mg/dL   Reason for Visit: Hyper and Hypoglycemia with elevated Hgb A1c  Results for Joann Gutierrez, Joann Gutierrez (MRN [id number]) as of 05/20/2011 14:19  Ref. Range 05/19/2011 01:37 05/19/2011 07:34 05/19/2011 11:17 05/19/2011 16:56 05/19/2011 18:08 05/19/2011 21:34 05/20/2011 07:31 05/20/2011 12:19  Glucose-Capillary Latest Range: 70-99 mg/dL 288 (H) 529 (H) 338 (H) 58 (L) 96 188 (H) 213 (H) 181 (H)   Note: Note very high CBG of 529 mg/dl yesterday before breakfast.  Received large doses of insulin and was down to 58 mg/dl in the afternoon.  Home Med Rec lists both Lantus 45 units daily in the morning and NPH 45 units daily in the morning.  When asked patient about these insulins and dosages, she told me that she takes Lantus 45 every morning and the "other kind is 15 units at night".  Had difficulty telling me what time "night" was.  Finally she expressed that she takes the NPH 15 units at bedtime.  A1C elevated at 11.3.  Question reliability of patient taking own insulin correctly at home.  Patient did tell me, as noted elsewhere, that she is trying to get f/u at [facility] after discharge. Jeidi Gilles S. Marcelline Mates, RN, [place], [place]  [phone])

## 2011-05-20 NOTE — Progress Notes (Signed)
UR complete 

## 2011-05-21 ENCOUNTER — Inpatient Hospital Stay (HOSPITAL_COMMUNITY): Payer: Medicare Other

## 2011-05-21 DIAGNOSIS — N179 Acute kidney failure, unspecified: Secondary | ICD-10-CM | POA: Diagnosis not present

## 2011-05-21 DIAGNOSIS — I129 Hypertensive chronic kidney disease with stage 1 through stage 4 chronic kidney disease, or unspecified chronic kidney disease: Secondary | ICD-10-CM | POA: Diagnosis not present

## 2011-05-21 DIAGNOSIS — E871 Hypo-osmolality and hyponatremia: Secondary | ICD-10-CM | POA: Diagnosis not present

## 2011-05-21 DIAGNOSIS — E1129 Type 2 diabetes mellitus with other diabetic kidney complication: Secondary | ICD-10-CM | POA: Diagnosis not present

## 2011-05-21 DIAGNOSIS — I369 Nonrheumatic tricuspid valve disorder, unspecified: Secondary | ICD-10-CM

## 2011-05-21 DIAGNOSIS — N189 Chronic kidney disease, unspecified: Secondary | ICD-10-CM | POA: Diagnosis not present

## 2011-05-21 DIAGNOSIS — J209 Acute bronchitis, unspecified: Secondary | ICD-10-CM | POA: Diagnosis not present

## 2011-05-21 DIAGNOSIS — N281 Cyst of kidney, acquired: Secondary | ICD-10-CM | POA: Diagnosis not present

## 2011-05-21 DIAGNOSIS — J9620 Acute and chronic respiratory failure, unspecified whether with hypoxia or hypercapnia: Secondary | ICD-10-CM | POA: Diagnosis not present

## 2011-05-21 LAB — BASIC METABOLIC PANEL
BUN: 71 mg/dL — ABNORMAL HIGH (ref 6–23)
CO2: 17 mEq/L — ABNORMAL LOW (ref 19–32)
CO2: 22 mEq/L (ref 19–32)
Calcium: 8.4 mg/dL (ref 8.4–10.5)
Calcium: 8.2 mg/dL — ABNORMAL LOW (ref 8.4–10.5)
Chloride: 101 mEq/L (ref 96–112)
GFR calc non Af Amer: 11 mL/min — ABNORMAL LOW (ref >90)
Glucose, Bld: 100 mg/dL — ABNORMAL HIGH (ref 70–99)
Glucose, Bld: 108 mg/dL — ABNORMAL HIGH (ref 70–99)
Potassium: 5.1 mEq/L (ref 3.5–5.1)
Sodium: 133 mEq/L — ABNORMAL LOW (ref 135–145)
Sodium: 133 mEq/L — ABNORMAL LOW (ref 135–145)

## 2011-05-21 LAB — PROTEIN / CREATININE RATIO, URINE
Protein Creatinine Ratio: 0.06 (ref 0.00–0.15)
Total Protein, Urine: 5.3 mg/dL

## 2011-05-21 LAB — URINALYSIS, ROUTINE W REFLEX MICROSCOPIC
Bilirubin Urine: NEGATIVE
Glucose, UA: NEGATIVE mg/dL
Hgb urine dipstick: NEGATIVE
Ketones, ur: NEGATIVE mg/dL
Specific Gravity, Urine: 1.018 (ref 1.005–1.030)
pH: 6 (ref 5.0–8.0)

## 2011-05-21 LAB — CBC
Hemoglobin: 9.8 g/dL — ABNORMAL LOW (ref 12.0–15.0)
MCH: 23.2 pg — ABNORMAL LOW (ref 26.0–34.0)
MCV: 72.3 fL — ABNORMAL LOW (ref 78.0–100.0)
Platelets: 221 10*3/uL (ref 150–400)
RBC: 4.23 MIL/uL (ref 3.87–5.11)
WBC: 13.2 10*3/uL — ABNORMAL HIGH (ref 4.0–10.5)

## 2011-05-21 LAB — GLUCOSE, CAPILLARY
Glucose-Capillary: 78 mg/dL (ref 70–99)
Glucose-Capillary: 183 mg/dL — ABNORMAL HIGH (ref 70–99)

## 2011-05-21 MED ORDER — SODIUM BICARBONATE 650 MG PO TABS
650.0000 mg | ORAL_TABLET | Freq: Three times a day (TID) | ORAL | Status: AC
  Administered 2011-05-21 – 2011-05-23 (×6): 650 mg via ORAL
  Filled 2011-05-21 (×6): qty 1

## 2011-05-21 MED ORDER — SODIUM CHLORIDE 0.9 % IV SOLN
INTRAVENOUS | Status: DC
  Administered 2011-05-21 – 2011-05-22 (×2): via INTRAVENOUS

## 2011-05-21 NOTE — Progress Notes (Signed)
  Echocardiogram 2D Echocardiogram has been performed.  Joann Gutierrez L 05/21/2011, 12:24 PM

## 2011-05-21 NOTE — Progress Notes (Signed)
Subjective: Breathing continue to be ok. No abdominal pain, no CP, no N/V. Patient with LE swelling and worsening kidney function this morning, Cr up to 4.17  Objective: Vital signs in last 24 hours: Temp:  [97.6 F (36.4 C)-97.9 F (36.6 C)] 97.9 F (36.6 C) (04/30 1508) Pulse Rate:  [50-52] 52  (04/30 1508) Resp:  [17-20] 20  (04/30 1508) BP: (106-123)/(59-69) 106/66 mmHg (04/30 1508) SpO2:  [94 %-96 %] 94 % (04/30 1508) Weight change:  Last BM Date: 05/18/11  Intake/Output from previous day: 04/29 0701 - 04/30 0700 In: 55 [P.O.:440; I.V.:450] Out: 200 [Urine:200] Total I/O In: 240 [P.O.:240] Out: 350 [Urine:350]   Physical Exam: General: Alert, awake, oriented x 3, in no acute distress. HEENT: No bruits, no goiter. Heart: Regular rate and rhythm, without murmurs, rubs, gallops. Lungs: good air movement, no wheezing. Abdomen: Soft, nontender, nondistended, positive bowel sounds. Extremities: No clubbing or cyanosis; +1 edema bilaterally; with positive pedal pulses. Neuro: Grossly intact, nonfocal.   Lab Results: Basic Metabolic Panel:  Basename 05/21/11 1515 05/21/11 0535  NA 133* 133*  K 5.2* 5.1  CL 101 101  CO2 22 17*  GLUCOSE 108* 100*  BUN 71* 69*  CREATININE 3.83* 4.17*  CALCIUM 8.2* 8.4  MG -- --  PHOS -- --   Liver Function Tests:  [facility] 05/18/11 2227  AST 20  ALT 12  ALKPHOS 118*  BILITOT 0.5  PROT 8.2  ALBUMIN 3.3*   CBC:  Basename 05/21/11 0535 05/20/11 0508  WBC 13.2* 15.1*  NEUTROABS -- --  HGB 9.8* 10.1*  HCT 30.6* 32.1*  MCV 72.3* 72.6*  PLT 221 240   Cardiac Enzymes:  Basename 05/19/11 1318 05/19/11 0737 05/19/11 0231  CKTOTAL 760* 626* 664*  CKMB 3.7 3.0 2.8  CKMBINDEX -- -- --  TROPONINI <0.30 <0.30 <0.30   BNP:  Basename 05/18/11 2227  PROBNP 1703.0*   CBG:  Basename 05/21/11 1139 05/21/11 1009 05/21/11 0744 05/20/11 2202 05/20/11 1643 05/20/11 1219  GLUCAP 103* 192* 78 190* 95 181*   Hemoglobin  A1C:  Basename 05/19/11 0231  HGBA1C 11.3*   Anemia Panel:  Basename 05/19/11 0231  VITAMINB12 612  FOLATE >20.0  FERRITIN 246  TIBC 213*  IRON 19*  RETICCTPCT 1.7   Urinalysis:  Basename 05/21/11 1508 05/20/11 0621  COLORURINE YELLOW YELLOW  LABSPEC 1.018 1.028  PHURINE 6.0 6.0  GLUCOSEU NEGATIVE >1000*  HGBUR NEGATIVE NEGATIVE  BILIRUBINUR NEGATIVE NEGATIVE  KETONESUR NEGATIVE TRACE*  PROTEINUR NEGATIVE 100*  UROBILINOGEN 0.2 0.2  NITRITE NEGATIVE NEGATIVE  LEUKOCYTESUR NEGATIVE NEGATIVE   Studies/Results: US Renal  05/21/2011  *RADIOLOGY REPORT*  Clinical Data: Acute on chronic renal failure  RENAL/URINARY TRACT ULTRASOUND  Technique: Renal ultrasound  Comparison:  11/12/2009  Findings: Right kidney measures 10 cm in length.  No hydronephrosis or diagnostic renal calculus.  A mid pole cyst measures 8 x 9 mm.  Left kidney measures 10.3 cm in length.  No hydronephrosis or diagnostic renal calculus.  A lower pole cyst measures 2.1 x 1.6 cm.  The urinary bladder is decompressed with Foley catheter.  IMPRESSION:  1.  No hydronephrosis or diagnostic renal calculus.  Small cyst is noted mid pole of the right kidney.  A cyst in lower pole of the left kidney measures 2.1 x 1.6 cm. 2.  Foley catheter in a decompressed urinary bladder.  Original Report Authenticated By: Lahoma Crocker, M.D.    Medications: Scheduled Meds:    . albuterol  2.5 mg Nebulization Once  .  amLODipine  5 mg Oral Daily  . aspirin EC  325 mg Oral Daily  . atorvastatin  40 mg Oral Daily  . citalopram  20 mg Oral Daily  . cyclobenzaprine  10 mg Oral Daily  . dicyclomine  10 mg Oral TID AC  . diltiazem  240 mg Oral Daily  . donepezil  5 mg Oral Daily  . ezetimibe  10 mg Oral Daily  . fluticasone  2 spray Each Nare Daily  . furosemide  80 mg Oral Daily  . gabapentin  300 mg Oral BID  . heparin subcutaneous  5,000 Units Subcutaneous Q8H  . hydrALAZINE  10 mg Oral TID  . insulin aspart  0-20 Units  Subcutaneous TID WC  . insulin glargine  45 Units Subcutaneous BID  . metoCLOPramide  10 mg Oral QID  . montelukast  10 mg Oral Daily  . moxifloxacin  400 mg Oral q1800  . pantoprazole  80 mg Oral BID AC  . predniSONE  60 mg Oral Q breakfast  . raloxifene  60 mg Oral Daily  . sodium bicarbonate  650 mg Oral TID  . DISCONTD: diclofenac  75 mg Oral Daily  . DISCONTD: metoprolol  50 mg Oral Daily  . DISCONTD: potassium chloride SA  40 mEq Oral Daily   Continuous Infusions:    . sodium chloride 75 mL/hr at 05/21/11 1618  . DISCONTD: sodium chloride 75 mL/hr at 05/20/11 2347   PRN Meds:.acetaminophen, acetaminophen, morphine, ondansetron (ZOFRAN) IV, ondansetron, phenol, traMADol  Assessment/Plan: 1-Acute-on-chronic respiratory failure: continue improving. Will continue tapering steroids, PO antibiotics (Day 3/8) and continue inhaler treatment and nebulizer therapy.Marland Kitchen  2-DIABETES MELLITUS, TYPE II: continue SSI and lantus BID. A1C 11.3; better control  3-HYPERCHOLESTEROLEMIA: continue statins.  4-OBSTRUCTIVE SLEEP APNEA: continue CPAP  5-PERIPHERAL NEUROPATHY, FEET: continue Neurontin.  6-ALLERGIC RHINITIS: continue fluticasone and singulair.  7-Chronic obstructive asthma: continue treatment as mentioned above. No wheezing  8-GERD (gastroesophageal reflux disease): continue PPI>  9-Bronchitis: continue antibiotics  10-acute on CKD (chronic kidney disease), stage III: Cr continue trending up; will follow renal service recommendations (consulted today). Continue holding ARB, HCTZ and decrease lasix to once a day. Will follow Cr trend. Fluid adjusted to 75cc/hr. Started on sodium bicarb and will keep SBP above [id number].  11-Metabolic acidosis: will follow renal rec's and start sodium bicarb TID.  12-HTN: will adjust BP meds to keep SBP > 100 at all time and to maintain HR > 55.  [id number] diastolic heart failure: will repeat 2-D echo, daily weight and strict I's and O's.  14-DVT:  heparin.    LOS: 3 days   Deangelo Berns [facility] [phone]  05/21/2011, 4:24 PM

## 2011-05-21 NOTE — Clinical Documentation Improvement (Signed)
CHF DOCUMENTATION CLARIFICATION QUERY  THIS DOCUMENT IS NOT A PERMANENT PART OF THE MEDICAL RECORD  TO RESPOND TO THE THIS QUERY, FOLLOW THE INSTRUCTIONS BELOW:  1. If needed, update documentation for the patient's encounter via the notes activity.  2. Access this query again and click edit on the [facility] [employer].  3. After updating, or not, click F2 to complete all highlighted (required) fields concerning your review. Select "additional documentation in the medical record" OR "no additional documentation provided".  4. Click Sign note button.  5. The deficiency will fall out of your In Basket *Please let us know if you are not able to complete this workflow by phone or e-mail (listed below).  Please update your documentation within the medical record to reflect your response to this query.                                                                                    05/21/11  Dear Dr.[facility]ather[facility],  In a better effort to capture your patient's severity of illness, reflect appropriate length of stay and utilization of resources, a review of the patient medical record has revealed the following indicators the diagnosis of Heart Failure.    Based on your clinical judgment, please clarify and document in a progress note and/or discharge summary the clinical condition associated with the following supporting information:  In responding to this query please exercise your independent judgment.  The fact that a query is asked, does not imply that any particular answer is desired or expected. According to  H&P  pt has CHF.  Please clarify the the acuity and type of CHF and document in pn and  d/c summary. Best Practice: Note the acuity and type of CHF for every patient admission  Possible Clinical Conditions?  Chronic Systolic Congestive Heart Failure Chronic Diastolic Congestive Heart Failure Chronic Systolic & Diastolic Congestive Heart Failure  Acute Systolic Congestive Heart  Failure Acute Diastolic Congestive Heart Failure Acute Systolic & Diastolic Congestive Heart Failure Acute on Chronic Systolic Congestive Heart Failure Acute on Chronic Diastolic Congestive Heart Failure Acute on Chronic Systolic & Diastolic  Congestive Heart Failure  Other Condition________________________________________ Cannot Clinically Determine  Supporting Information: Signs & Symptoms: 05/20/11  "edema BLE and hands"  BNP: 05/18/11 >  1703.0       Echo results: 02/01/2009  Left ventricle: The cavity size was normal. Systolic function was  normal. The estimated ejection fraction was in the range of 55% to  65%. Wall motion was normal; there were no regional wall motion  abnormalities.  EF:55-65%  EKG:  05/18/11      SINUS TACHYCARDIA ~ V-rate> 99 CONSIDER LEFT ATRIAL ABNORMALITY ~ wide or notched P  Treatment: O2 therapy, CPAP Meds: Albuterol, Prednisone,  Diuretics:Lasix 80 mg daily   Reviewed: Patient with chronic diastolic CHF; info added to notes  Thank You,  Philippa [name] RN  Clinical Documentation Specialist:  Pager [phone] E-mail [email or web address]

## 2011-05-21 NOTE — Consult Note (Signed)
Reason for Consult: Acute on chronic renal failure (likely chronic kidney disease stage III at baseline) Referring Physician: Barton Dubois M.D.-[facility]  Joann Gutierrez is an 66 y.o. female.  HPI: Joann Gutierrez is a 66 y.o. African American woman with history of type 2 diabetes for about 15 years (no reported retinopathy), hypertension yrs), hyperlipidemia, coronary disease, obstructive sleep apnea on CPAP, asthma, obesity and baseline chronic kidney disease stage III (creatinine from Epic noted to have ranged from 1.2-1.5- estimated GFR 45-55 mL per minute).  She presented to the hospital with what appeared to be acute exacerbation of chronic obstructive lung disease and initially was also thought to have been suffering from mild volume depletion prompting cessation of ARB/diuretic therapy. Review of the hospital records so far indicates that in the first 24-48 hours, she suffered significant tachycardia with relative hypotension which was followed by acute kidney injury with rise of creatinine from 1.6 on admission to 3.3 the next day and now to 4.17. Also noted in this interim period was decreased of her urine output and emergence of pedal edema prompting furosemide to be restarted. No contrast exposure is noted and no suspicious antibiotic exposure noted. Unfortunately, she was continued on diclofenac therapy which I have discontinued today.  She denies any prior history of acute renal failure, denies history of kidney stones, denies any recent skin rash or hemoptysis. She does suffer chronic asthma/sinusitis and rhinitis type symptoms. Denies recent tinnitus or epistaxis. Unfortunately, she is not well versed with her own medical history and is unable to tell me if she has proteinuria or not. Also not able to tell me what her baseline renal function is.  Past Medical History  Diagnosis Date  . Allergy   . Depression   . Diabetes mellitus 1997    Type II   . GERD  (gastroesophageal reflux disease)   . Hypertension   . Hyperlipidemia   . Osteoarthritis   . Osteoporosis   . Hair loss   . Carpal tunnel syndrome on left   . Anemia   . CAD (coronary artery disease)     Mild very minimal coronary disease with 20% obtuse marginal stenosis  . CVA (cerebral infarction)   . Diverticulosis   . Esophageal stricture   . Gastritis   . Adenomatous colon polyp   . Sickle cell trait   . Asthma     pt has cpap w/ her today  . COPD (chronic obstructive pulmonary disease)   . 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 Gastroparesis 08/21/2007  . RENAL INSUFFICIENCY 02/16/2009  . OSTEOARTHRITIS 08/09/2006  . Carpal tunnel syndrome, left   . Morbid obesity   . Hernia, hiatal   . CPAP (continuous positive airway pressure) dependence   . CHF (congestive heart failure)   . Stroke   . Sickle cell anemia   . Seizures     Past Surgical History  Procedure Date  . Abdominal hysterectomy   . Replacement total knee 1998    Left  . Esophagogastroduodenoscopy 06/25/2006  . Breast lumpectomy     benign  . Erd 08/08/2000  . Breast lumpectomy     both breast lumps removed   . Artery biopsy 01/07/2011    Procedure: MINOR BIOPSY TEMPORAL ARTERY;  Surgeon: Haywood Lasso, MD;  Location: [facility];  Service: General;  Laterality: Left;  left temporal artery biopsy    Family History  Problem Relation Age of Onset  .  Colon cancer Brother   . Cancer Brother     Colon Cancer  . Cancer Mother     Liver Cancer  . Diabetes Mother   . Kidney disease Mother   . Heart disease Mother     age 32's  . Heart disease Father   . Heart attack Father     died of MI when pt was 30  . Sickle cell anemia Father   . Diabetes Sister   . Kidney disease Sister   . Heart disease Sister     age 66's  . Allergies Sister   . Diabetes Sister   . Kidney disease Sister   . Heart disease Sister     age 22's    Social History:   reports that she quit smoking about 31 years ago. She has never used smokeless tobacco. She reports that she does not drink alcohol or use illicit drugs.  Allergies:  Allergies  Allergen Reactions  . Promethazine Hcl     Medications:  Scheduled:   . albuterol  2.5 mg Nebulization Once  . amLODipine  5 mg Oral Daily  . aspirin EC  325 mg Oral Daily  . atorvastatin  40 mg Oral Daily  . citalopram  20 mg Oral Daily  . cyclobenzaprine  10 mg Oral Daily  . dicyclomine  10 mg Oral TID AC  . diltiazem  240 mg Oral Daily  . donepezil  5 mg Oral Daily  . ezetimibe  10 mg Oral Daily  . fluticasone  2 spray Each Nare Daily  . furosemide  80 mg Oral Daily  . gabapentin  300 mg Oral BID  . heparin subcutaneous  5,000 Units Subcutaneous Q8H  . hydrALAZINE  10 mg Oral TID  . insulin aspart  0-20 Units Subcutaneous TID WC  . insulin glargine  45 Units Subcutaneous BID  . metoCLOPramide  10 mg Oral QID  . montelukast  10 mg Oral Daily  . moxifloxacin  400 mg Oral q1800  . pantoprazole  80 mg Oral BID AC  . predniSONE  60 mg Oral Q breakfast  . raloxifene  60 mg Oral Daily  . DISCONTD: azithromycin  500 mg Intravenous Q24H  . DISCONTD: cefTRIAXone (ROCEPHIN)  IV  1 g Intravenous Q24H  . DISCONTD: diclofenac  75 mg Oral Daily  . DISCONTD: methylPREDNISolone (SOLU-MEDROL) injection  40 mg Intravenous Q12H  . DISCONTD: metoprolol  50 mg Oral Daily  . DISCONTD: potassium chloride SA  40 mEq Oral Daily    Results for orders placed during the hospital encounter of 05/18/11 (from the past 48 hour(s))  GLUCOSE, CAPILLARY     Status: Abnormal   Collection Time   05/19/11  4:56 PM      Component Value Range Comment   Glucose-Capillary 58 (*) 70 - 99 (mg/dL)   GLUCOSE, CAPILLARY     Status: Normal   Collection Time   05/19/11  6:08 PM      Component Value Range Comment   Glucose-Capillary 96  70 - 99 (mg/dL)   GLUCOSE, CAPILLARY     Status: Abnormal   Collection Time   05/19/11  9:34 PM       Component Value Range Comment   Glucose-Capillary 188 (*) 70 - 99 (mg/dL)   CBC     Status: Abnormal   Collection Time   05/20/11  5:08 AM      Component Value Range Comment   WBC 15.1 (*) 4.0 - 10.5 (K/uL)  RBC 4.42  3.87 - 5.11 (MIL/uL)    Hemoglobin 10.1 (*) 12.0 - 15.0 (g/dL)    HCT 32.1 (*) 36.0 - 46.0 (%)    MCV 72.6 (*) 78.0 - 100.0 (fL)    MCH 22.9 (*) 26.0 - 34.0 (pg)    MCHC 31.5  30.0 - 36.0 (g/dL)    RDW 14.3  11.5 - 15.5 (%)    Platelets 240  150 - 400 (K/uL)   BASIC METABOLIC PANEL     Status: Abnormal   Collection Time   05/20/11  5:08 AM      Component Value Range Comment   Sodium 135  135 - 145 (mEq/L) REPEATED TO VERIFY   Potassium 5.2 (*) 3.5 - 5.1 (mEq/L)    Chloride 101  96 - 112 (mEq/L) REPEATED TO VERIFY   CO2 23  19 - 32 (mEq/L) REPEATED TO VERIFY   Glucose, Bld 217 (*) 70 - 99 (mg/dL) REPEATED TO VERIFY   BUN 49 (*) 6 - 23 (mg/dL)    Creatinine, Ser 3.37 (*) 0.50 - 1.10 (mg/dL)    Calcium 8.7  8.4 - 10.5 (mg/dL) REPEATED TO VERIFY   GFR calc non Af Amer 13 (*) >90 (mL/min)    GFR calc Af Amer 15 (*) >90 (mL/min)   URINALYSIS, ROUTINE W REFLEX MICROSCOPIC     Status: Abnormal   Collection Time   05/20/11  6:21 AM      Component Value Range Comment   Color, Urine YELLOW  YELLOW     APPearance CLOUDY (*) CLEAR     Specific Gravity, Urine 1.028  1.005 - 1.030     pH 6.0  5.0 - 8.0     Glucose, UA >1000 (*) NEGATIVE (mg/dL)    Hgb urine dipstick NEGATIVE  NEGATIVE     Bilirubin Urine NEGATIVE  NEGATIVE     Ketones, ur TRACE (*) NEGATIVE (mg/dL)    Protein, ur 100 (*) NEGATIVE (mg/dL)    Urobilinogen, UA 0.2  0.0 - 1.0 (mg/dL)    Nitrite NEGATIVE  NEGATIVE     Leukocytes, UA NEGATIVE  NEGATIVE    URINE MICROSCOPIC-ADD ON     Status: Abnormal   Collection Time   05/20/11  6:21 AM      Component Value Range Comment   Squamous Epithelial / LPF MANY (*) RARE     WBC, UA 7-10  <3 (WBC/hpf)    RBC / HPF 3-6  <3 (RBC/hpf)    Bacteria, UA MANY (*) RARE       Urine-Other RARE YEAST     GLUCOSE, CAPILLARY     Status: Abnormal   Collection Time   05/20/11  7:31 AM      Component Value Range Comment   Glucose-Capillary 213 (*) 70 - 99 (mg/dL)   GLUCOSE, CAPILLARY     Status: Abnormal   Collection Time   05/20/11 12:19 PM      Component Value Range Comment   Glucose-Capillary 181 (*) 70 - 99 (mg/dL)   GLUCOSE, CAPILLARY     Status: Normal   Collection Time   05/20/11  4:43 PM      Component Value Range Comment   Glucose-Capillary 95  70 - 99 (mg/dL)    Comment 1 Documented in Chart      Comment 2 Notify RN     GLUCOSE, CAPILLARY     Status: Abnormal   Collection Time   05/20/11 10:02 PM      Component  Value Range Comment   Glucose-Capillary 190 (*) 70 - 99 (mg/dL)    Comment 1 Notify RN     BASIC METABOLIC PANEL     Status: Abnormal   Collection Time   05/21/11  5:35 AM      Component Value Range Comment   Sodium 133 (*) 135 - 145 (mEq/L)    Potassium 5.1  3.5 - 5.1 (mEq/L)    Chloride 101  96 - 112 (mEq/L)    CO2 17 (*) 19 - 32 (mEq/L)    Glucose, Bld 100 (*) 70 - 99 (mg/dL)    BUN 69 (*) 6 - 23 (mg/dL)    Creatinine, Ser 4.17 (*) 0.50 - 1.10 (mg/dL)    Calcium 8.4  8.4 - 10.5 (mg/dL)    GFR calc non Af Amer 10 (*) >90 (mL/min)    GFR calc Af Amer 12 (*) >90 (mL/min)   CBC     Status: Abnormal   Collection Time   05/21/11  5:35 AM      Component Value Range Comment   WBC 13.2 (*) 4.0 - 10.5 (K/uL)    RBC 4.23  3.87 - 5.11 (MIL/uL)    Hemoglobin 9.8 (*) 12.0 - 15.0 (g/dL)    HCT 30.6 (*) 36.0 - 46.0 (%)    MCV 72.3 (*) 78.0 - 100.0 (fL)    MCH 23.2 (*) 26.0 - 34.0 (pg)    MCHC 32.0  30.0 - 36.0 (g/dL)    RDW 14.4  11.5 - 15.5 (%)    Platelets 221  150 - 400 (K/uL)   GLUCOSE, CAPILLARY     Status: Normal   Collection Time   05/21/11  7:44 AM      Component Value Range Comment   Glucose-Capillary 78  70 - 99 (mg/dL)    Comment 1 Notify RN     GLUCOSE, CAPILLARY     Status: Abnormal   Collection Time   05/21/11 10:09 AM       Component Value Range Comment   Glucose-Capillary 192 (*) 70 - 99 (mg/dL)   GLUCOSE, CAPILLARY     Status: Abnormal   Collection Time   05/21/11 11:39 AM      Component Value Range Comment   Glucose-Capillary 103 (*) 70 - 99 (mg/dL)    Comment 1 Notify RN       No results found.  Review of Systems  Constitutional: Positive for chills and malaise/fatigue. Negative for fever, weight loss and diaphoresis.  HENT: Positive for congestion and sore throat. Negative for hearing loss, ear pain, neck pain, tinnitus and ear discharge.   Eyes: Negative.   Respiratory: Positive for cough, sputum production, shortness of breath and wheezing. Negative for hemoptysis.   Cardiovascular: Positive for chest pain, palpitations and leg swelling. Negative for orthopnea, claudication and PND.  Gastrointestinal: Positive for heartburn and nausea. Negative for vomiting, abdominal pain, diarrhea, constipation, blood in stool and melena.  Genitourinary: Negative.   Musculoskeletal: Positive for myalgias and back pain. Negative for joint pain and falls.  Skin: Negative.   Neurological: Positive for weakness. Negative for dizziness, tingling, tremors, sensory change, speech change, focal weakness, loss of consciousness and headaches.  Endo/Heme/Allergies: Negative.   Psychiatric/Behavioral: Negative for depression and hallucinations. The patient is nervous/anxious.   All other systems reviewed and are negative.   Blood pressure 109/69, pulse 52, temperature 97.6 F (36.4 C), temperature source Oral, resp. rate 18, height 5\' 4"  (1.626 m), weight 120.711 kg (266 lb 1.9  oz), SpO2 95.00%. Physical Exam  Nursing note and vitals reviewed. Constitutional: She is oriented to person, place, and time. She appears well-developed and well-nourished. No distress.  HENT:  Head: Normocephalic and atraumatic.  Mouth/Throat: Oropharynx is clear and moist. No oropharyngeal exudate.  Eyes: Conjunctivae and EOM are normal.  Pupils are equal, round, and reactive to light. Right eye exhibits no discharge. Left eye exhibits no discharge. No scleral icterus.  Neck: Normal range of motion. No JVD present. No tracheal deviation present. No thyromegaly present.  Cardiovascular: Normal rate, regular rhythm and normal heart sounds.  Exam reveals no gallop.   No murmur heard. Respiratory: Effort normal. No stridor. No respiratory distress. She has wheezes. She has rales.  GI: Soft. Bowel sounds are normal. She exhibits no distension and no mass. There is no tenderness. There is no rebound and no guarding.  Musculoskeletal: Normal range of motion. She exhibits edema and tenderness.  Lymphadenopathy:    She has no cervical adenopathy.  Neurological: She is alert and oriented to person, place, and time. She displays normal reflexes. No cranial nerve deficit. Coordination normal.  Skin: Skin is warm and dry. No rash noted. She is not diaphoretic. No erythema. No pallor.  Psychiatric: Her behavior is normal. Thought content normal.    Assessment/Plan: 1. Acute renal failure on chronic kidney disease stage III: Based on the history, timeline of events and available data base surrounding this admission-I suspect that the mechanism of this renal injury is likely hemodynamically mediated with a relative hypotension/tachycardia early on during hospitalization. This is probably compounded by the fact that she was continued on her nonsteroidal anti-inflammatory drugs and had preceding use of ARB/diuretic. At this time fortunately, she is nonoliguric and appears to have been having improvement of her urine output. No acute electrolyte issues are recognized towards the need for renal replacement therapy and electrolytes appear to be stable. I will change her fluids in order to try and correct her mild metabolic acidosis and indirectly correct her hyperkalemia. I will discontinue her potassium supplement. Agree with her Foley catheter for strict  input/output management and we'll await her renal. We'll recheck her urinary electrolytes/urinalysis and empirically screen for acute glomerulonephritis with ANCA, ANA, C3, C4 and anti-double strand DNA antibodies. Pretest suspicion is low for glomerulonephritis at this time. 2. Metabolic acidosis: Anion gap, secondary to acute renal failure. Will cautiously give intravenous fluids containing bicarbonate. Will be careful as to not worsen her currently tenuous respiratory status.  3. Hyponatremia: Secondary to acute renal failure and impaired free water handling by an injured kidney. Monitor with intravenous fluids/improving urine output. 4. Mild hyperkalemia: Discontinue potassium supplements/nonsteroidal anti-inflammatory drugs. Start renal diet 80/2/2 with 1200 cc fluid restriction. 5. Acute exacerbation of chronic obstructive lung disease versus asthma: Management per hospitalist service, currently on bronchodilators and fluoroquinolone therapy.    Veron Senner K. 05/21/2011, 2:01 PM

## 2011-05-21 NOTE — Progress Notes (Signed)
Pt seen, already wearing cpap for rest at 9cm h2o with 2l o2 bleedin.  Pt is wearing a full face mask as she wears this at home and is tolerating it well at this time.

## 2011-05-22 DIAGNOSIS — J9620 Acute and chronic respiratory failure, unspecified whether with hypoxia or hypercapnia: Secondary | ICD-10-CM

## 2011-05-22 DIAGNOSIS — J441 Chronic obstructive pulmonary disease with (acute) exacerbation: Secondary | ICD-10-CM

## 2011-05-22 DIAGNOSIS — I10 Essential (primary) hypertension: Secondary | ICD-10-CM

## 2011-05-22 DIAGNOSIS — I129 Hypertensive chronic kidney disease with stage 1 through stage 4 chronic kidney disease, or unspecified chronic kidney disease: Secondary | ICD-10-CM | POA: Diagnosis not present

## 2011-05-22 DIAGNOSIS — N179 Acute kidney failure, unspecified: Secondary | ICD-10-CM

## 2011-05-22 DIAGNOSIS — E871 Hypo-osmolality and hyponatremia: Secondary | ICD-10-CM | POA: Diagnosis not present

## 2011-05-22 DIAGNOSIS — J449 Chronic obstructive pulmonary disease, unspecified: Secondary | ICD-10-CM | POA: Diagnosis not present

## 2011-05-22 DIAGNOSIS — J45901 Unspecified asthma with (acute) exacerbation: Secondary | ICD-10-CM

## 2011-05-22 LAB — GLUCOSE, CAPILLARY: Glucose-Capillary: 62 mg/dL — ABNORMAL LOW (ref 70–99)

## 2011-05-22 LAB — RENAL FUNCTION PANEL
CO2: 21 mEq/L (ref 19–32)
Calcium: 8.3 mg/dL — ABNORMAL LOW (ref 8.4–10.5)
Chloride: 102 mEq/L (ref 96–112)
Creatinine, Ser: 3.16 mg/dL — ABNORMAL HIGH (ref 0.50–1.10)
GFR calc Af Amer: 17 mL/min — ABNORMAL LOW (ref >90)
GFR calc non Af Amer: 14 mL/min — ABNORMAL LOW (ref >90)
Glucose, Bld: 94 mg/dL (ref 70–99)
Sodium: 133 mEq/L — ABNORMAL LOW (ref 135–145)

## 2011-05-22 LAB — C3 COMPLEMENT: C3 Complement: 160 mg/dL (ref 90–180)

## 2011-05-22 LAB — CBC
HCT: 31.3 % — ABNORMAL LOW (ref 36.0–46.0)
Hemoglobin: 9.8 g/dL — ABNORMAL LOW (ref 12.0–15.0)
RBC: 4.32 MIL/uL (ref 3.87–5.11)

## 2011-05-22 LAB — MPO/PR-3 (ANCA) ANTIBODIES: Myeloperoxidase Abs: 1 AU/mL (ref <20)

## 2011-05-22 LAB — ANA: Anti Nuclear Antibody(ANA): POSITIVE — AB

## 2011-05-22 MED ORDER — INSULIN GLARGINE 100 UNIT/ML ~~LOC~~ SOLN
40.0000 [IU] | Freq: Two times a day (BID) | SUBCUTANEOUS | Status: DC
  Administered 2011-05-22: 40 [IU] via SUBCUTANEOUS

## 2011-05-22 MED ORDER — PREDNISONE 50 MG PO TABS
50.0000 mg | ORAL_TABLET | Freq: Every day | ORAL | Status: DC
  Administered 2011-05-23 – 2011-05-24 (×2): 50 mg via ORAL
  Filled 2011-05-22 (×3): qty 1

## 2011-05-22 NOTE — Progress Notes (Signed)
Placed pt on cpap for rest with full face mask as she wears this at home.  Settings 9cm h2o with 2l o2 bleedin.  Pt is tolerating cpap well at this time.  RN aware.

## 2011-05-22 NOTE — Progress Notes (Signed)
Physical Therapy Treatment Patient Details Name: Joann Gutierrez MRN: [id number] DOB: 10/18/1945 Today's Date: 05/22/2011 Time: [id number] PT Time Calculation (min): 15 min  PT Assessment / Plan / Recommendation Comments on Treatment Session  MD requested BP in sitting/ standing:  sit 150/68, stand 140/59.  Pt is progressing in her ambulation but still fatigues quickly needing standing rest breaks.  Pt safer with ambulation with RW, recommend for d/c home.  Pt will continue to follow.    Follow Up Recommendations  Home health PT;Supervision - Intermittent    Equipment Recommendations  Rolling walker with 5" wheels    Frequency Min 3X/week   Plan Discharge plan remains appropriate;Frequency remains appropriate    Precautions / Restrictions Precautions Precautions: Fall Restrictions Weight Bearing Restrictions: No   Pertinent Vitals/Pain Denied pain    Mobility  Bed Mobility Bed Mobility: Supine to Sit Supine to Sit: 6: Modified independent (Device/Increase time);HOB elevated;With rails Transfers Transfers: Sit to Stand;Stand to Sit Sit to Stand: 4: Min guard;With upper extremity assist;From bed Stand to Sit: 5: Supervision;With upper extremity assist;To chair/3-in-1 Details for Transfer Assistance: vc's for hand placement, ie pushing up from bed not pulling on RW and reaching back for chair Ambulation/Gait Ambulation/Gait Assistance: 5: Supervision Ambulation Distance (Feet): 130 Feet Assistive device: Rolling walker Ambulation/Gait Assistance Details: pt more steady with RW, vc's for staying close to it and taking rest breaks as needed.  Pt took 2 standing rest breaks, 30 sec each Gait Pattern: Decreased stride length;Step-through pattern;Wide base of support Gait velocity: decreased Stairs: No Wheelchair Mobility Wheelchair Mobility: No    Exercises     PT Goals Acute Rehab PT Goals PT Goal Formulation: With patient Time For Goal Achievement: 05/27/11 Potential  to Achieve Goals: Good Pt will go Sit to Stand: with modified independence PT Goal: Sit to Stand - Progress: Progressing toward goal Pt will go Stand to Sit: with modified independence PT Goal: Stand to Sit - Progress: Progressing toward goal Pt will Ambulate: 51 - 150 feet;with modified independence;with least restrictive assistive device PT Goal: Ambulate - Progress: Progressing toward goal  Visit Information  Last PT Received On: 05/22/11 Assistance Needed: +1    Subjective Data  Subjective: "I feel woozy headed" Patient Stated Goal: return home   Cognition  Overall Cognitive Status: Appears within functional limits for tasks assessed/performed Arousal/Alertness: Awake/alert Orientation Level: Appears intact for tasks assessed Behavior During Session: [facility] for tasks performed    Balance  Balance Balance Assessed: Yes Static Standing Balance Static Standing - Balance Support: Bilateral upper extremity supported;During functional activity Static Standing - Level of Assistance: 5: Stand by assistance  End of Session PT - End of Session Equipment Utilized During Treatment: Gait belt Activity Tolerance: Patient limited by fatigue Patient left: in chair;with call bell/phone within reach;with family/visitor present Nurse Communication: Mobility status   Leighton Roach, [place]  [phone]  Leighton Roach 05/22/2011, 4:17 PM

## 2011-05-22 NOTE — Progress Notes (Signed)
Patient ID: Joann Gutierrez, female   DOB: 1945-10-08, 66 y.o.   MRN: [id number]   [facility] Progress Note    Subjective:   Reports to be feeling well, improvement in both cough and shortness of breath. Excellent urine output noted overnight.    Objective:   BP 134/74  Pulse 65  Temp(Src) 97.7 F (36.5 C) (Oral)  Resp 18  Ht 5\' 4"  (1.626 m)  Wt 120.711 kg (266 lb 1.9 oz)  BMI 45.68 kg/m2  SpO2 98%  Intake/Output Summary (Last 24 hours) at 05/22/11 1105 Last data filed at 05/22/11 0654  Gross per 24 hour  Intake 1487.5 ml  Output   2250 ml  Net -762.5 ml   Weight change:   Physical Exam: Gen: Comfortable resting in bed, watching television CVS: Pulse regular in rate and rhythm, heart sounds S1 and S2 are normal Resp: Coarse breath sounds bilaterally, coarse rhonchi without rales Abd: Soft obese nontender and bowel sounds are normal Ext: Trace edema over the ankles bilaterally  Imaging: US Renal  05/21/2011  *RADIOLOGY REPORT*  Clinical Data: Acute on chronic renal failure  RENAL/URINARY TRACT ULTRASOUND  Technique: Renal ultrasound  Comparison:  11/12/2009  Findings: Right kidney measures 10 cm in length.  No hydronephrosis or diagnostic renal calculus.  A mid pole cyst measures 8 x 9 mm.  Left kidney measures 10.3 cm in length.  No hydronephrosis or diagnostic renal calculus.  A lower pole cyst measures 2.1 x 1.6 cm.  The urinary bladder is decompressed with Foley catheter.  IMPRESSION:  1.  No hydronephrosis or diagnostic renal calculus.  Small cyst is noted mid pole of the right kidney.  A cyst in lower pole of the left kidney measures 2.1 x 1.6 cm. 2.  Foley catheter in a decompressed urinary bladder.  Original Report Authenticated By: Lahoma Crocker, M.D.    Labs: BMET  Lab 05/22/11 [id number] 05/21/11 1515 05/21/11 0535 05/20/11 0508 05/19/11 0231 05/18/11 2227  NA 133* 133* 133* 135 140 141  K 4.8 5.2* 5.1 5.2* 3.3* 3.9  CL 102 101 101 101 102 103  CO2  21 22 17* 23 23 30   GLUCOSE 94 108* 100* 217* 337* 220*  BUN 69* 71* 69* 49* 22 19  CREATININE 3.16* 3.83* 4.17* 3.37* 1.59* 1.23*  ALB -- -- -- -- -- --  CALCIUM 8.3* 8.2* 8.4 8.7 8.9 10.0  PHOS 4.9* -- -- -- -- --   CBC  Lab 05/22/11 0513 05/21/11 0535 05/20/11 0508 05/19/11 0231  WBC 11.4* 13.2* 15.1* 14.8*  NEUTROABS -- -- -- --  HGB 9.8* 9.8* 10.1* 10.0*  HCT 31.3* 30.6* 32.1* 30.8*  MCV 72.5* 72.3* 72.6* 71.5*  PLT 260 221 240 245    Medications:      . albuterol  2.5 mg Nebulization Once  . amLODipine  5 mg Oral Daily  . aspirin EC  325 mg Oral Daily  . atorvastatin  40 mg Oral Daily  . citalopram  20 mg Oral Daily  . cyclobenzaprine  10 mg Oral Daily  . dicyclomine  10 mg Oral TID AC  . diltiazem  240 mg Oral Daily  . donepezil  5 mg Oral Daily  . ezetimibe  10 mg Oral Daily  . fluticasone  2 spray Each Nare Daily  . furosemide  80 mg Oral Daily  . gabapentin  300 mg Oral BID  . heparin subcutaneous  5,000 Units Subcutaneous Q8H  . hydrALAZINE  10 mg Oral  TID  . insulin aspart  0-20 Units Subcutaneous TID WC  . insulin glargine  45 Units Subcutaneous BID  . metoCLOPramide  10 mg Oral QID  . montelukast  10 mg Oral Daily  . moxifloxacin  400 mg Oral q1800  . pantoprazole  80 mg Oral BID AC  . predniSONE  60 mg Oral Q breakfast  . raloxifene  60 mg Oral Daily  . sodium bicarbonate  650 mg Oral TID  . DISCONTD: diclofenac  75 mg Oral Daily  . DISCONTD: metoprolol  50 mg Oral Daily  . DISCONTD: potassium chloride SA  40 mEq Oral Daily     Assessment/ Plan:   1. Acute renal failure on chronic kidney disease stage III: Based on the history, timeline of events and available data base surrounding this admission-I suspect that the mechanism of this renal injury is likely hemodynamically mediated with a relative hypotension/tachycardia early on during hospitalization. This is probably compounded by the fact that she was continued on her nonsteroidal  anti-inflammatory drugs and had preceding use of ARB/diuretic. Currently she is non-oliguric and now showing signs of renal recovery with improvement of creatinine/good urine output. Continue to monitor her closely-currently, without any acute electrolyte issues were volume concerns to necessitate consideration for renal replacement therapy. 2. Metabolic acidosis: Anion gap, secondary to acute renal failure. Bicarbonate level better on oral bicarbonate supplementation, we'll likely discontinue this tomorrow.  3. Hyponatremia: Secondary to acute renal failure and impaired free water handling by an injured kidney. Monitor with intravenous fluids/improving urine output.  4. . Acute exacerbation of chronic obstructive lung disease versus asthma: Management per hospitalist service, currently on bronchodilators and fluoroquinolone therapy-reports clinical improvement.   [name], MD 05/22/2011, 11:05 AM

## 2011-05-22 NOTE — Progress Notes (Addendum)
Subjective:   Chart reviewed. Patient indicates that she feels "woozy". She says she feels much better compared to that on admission. Denies dyspnea or chest pain.  Objective  Vital signs in last 24 hours: Filed Vitals:   05/21/11 2145 05/22/11 0655 05/22/11 1505 05/22/11 1609  BP: 124/61 134/74 157/73 150/68  Pulse: 56 65 57 62  Temp: 98.5 F (36.9 C) 97.7 F (36.5 C) 97.9 F (36.6 C)   TempSrc: Oral Oral Oral   Resp: 18 18 15    Height:      Weight:      SpO2: 94% 98% 94%    Weight change:   Intake/Output Summary (Last 24 hours) at 05/22/11 1858 Last data filed at 05/22/11 1145  Gross per 24 hour  Intake 1007.5 ml  Output   2300 ml  Net -1292.5 ml    Physical Exam:  General Exam: Comfortable. Sitting at edge of bed. Respiratory System: Clear. No increased work of breathing.  Cardiovascular System: First and second heart sounds heard. Regular rate and rhythm. No JVD/murmurs.  Gastrointestinal System: Abdomen is non distended, soft and normal bowel sounds heard.  Central Nervous System: Alert and oriented. No focal neurological deficits. Extremities: Symmetrical 5 x 5 power. 1+ pitting bilateral lower extremity edema  Labs:  Basic Metabolic Panel:  Lab [id number] 0513 05/21/11 1515 05/21/11 0535  NA 133* 133* 133*  K 4.8 5.2* 5.1  CL 102 101 101  CO2 21 22 17*  GLUCOSE 94 108* 100*  BUN 69* 71* 69*  CREATININE 3.16* 3.83* 4.17*  CALCIUM 8.3* 8.2* 8.4  ALB -- -- --  PHOS 4.9* -- --   Liver Function Tests:  Lab 05/22/11 0513 05/18/11 2227  AST -- 20  ALT -- 12  ALKPHOS -- 118*  BILITOT -- 0.5  PROT -- 8.2  ALBUMIN 2.5* 3.3*   No results found for this basename: LIPASE:3,AMYLASE:3 in the last 168 hours No results found for this basename: AMMONIA:3 in the last 168 hours CBC:  Lab 05/22/11 0513 05/21/11 0535 05/20/11 0508 05/19/11 0231 05/18/11 2227  WBC 11.4* 13.2* 15.1* -- --  NEUTROABS -- -- -- -- --  HGB 9.8* 9.8* 10.1* -- --  HCT 31.3* 30.6* 32.1*  -- --  MCV 72.5* 72.3* 72.6* 71.5* 71.4*  PLT 260 221 240 -- --   Cardiac Enzymes:  Lab 05/19/11 1318 05/19/11 0737 05/19/11 0231  CKTOTAL 760* 626* 664*  CKMB 3.7 3.0 2.8  CKMBINDEX -- -- --  TROPONINI <0.30 <0.30 <0.30   CBG:  Lab 05/22/11 1619 05/22/11 1157 05/22/11 0806 05/22/11 0745 05/21/11 2138  GLUCAP 79 114* 62* 55* 183*    Iron Studies: No results found for this basename: IRON,TIBC,TRANSFERRIN,FERRITIN in the last 72 hours Studies/Results: US Renal  05/21/2011  *RADIOLOGY REPORT*  Clinical Data: Acute on chronic renal failure  RENAL/URINARY TRACT ULTRASOUND  Technique: Renal ultrasound  Comparison:  11/12/2009  Findings: Right kidney measures 10 cm in length.  No hydronephrosis or diagnostic renal calculus.  A mid pole cyst measures 8 x 9 mm.  Left kidney measures 10.3 cm in length.  No hydronephrosis or diagnostic renal calculus.  A lower pole cyst measures 2.1 x 1.6 cm.  The urinary bladder is decompressed with Foley catheter.  IMPRESSION:  1.  No hydronephrosis or diagnostic renal calculus.  Small cyst is noted mid pole of the right kidney.  A cyst in lower pole of the left kidney measures 2.1 x 1.6 cm. 2.  Foley catheter in a  decompressed urinary bladder.  Original Report Authenticated By: Lahoma Crocker, M.D.   Medications:    . sodium chloride 75 mL/hr at 05/22/11 1145      . albuterol  2.5 mg Nebulization Once  . amLODipine  5 mg Oral Daily  . aspirin EC  325 mg Oral Daily  . atorvastatin  40 mg Oral Daily  . citalopram  20 mg Oral Daily  . cyclobenzaprine  10 mg Oral Daily  . dicyclomine  10 mg Oral TID AC  . diltiazem  240 mg Oral Daily  . donepezil  5 mg Oral Daily  . ezetimibe  10 mg Oral Daily  . fluticasone  2 spray Each Nare Daily  . furosemide  80 mg Oral Daily  . gabapentin  300 mg Oral BID  . heparin subcutaneous  5,000 Units Subcutaneous Q8H  . hydrALAZINE  10 mg Oral TID  . insulin aspart  0-20 Units Subcutaneous TID WC  . insulin glargine  45  Units Subcutaneous BID  . metoCLOPramide  10 mg Oral QID  . montelukast  10 mg Oral Daily  . moxifloxacin  400 mg Oral q1800  . pantoprazole  80 mg Oral BID AC  . predniSONE  60 mg Oral Q breakfast  . raloxifene  60 mg Oral Daily  . sodium bicarbonate  650 mg Oral TID    I  have reviewed scheduled and prn medications.     Problem/Plan: Principal Problem:  *Acute-on-chronic respiratory failure Active Problems:  DIABETES MELLITUS, TYPE II  HYPERCHOLESTEROLEMIA  OBSTRUCTIVE SLEEP APNEA  PERIPHERAL NEUROPATHY, FEET  ALLERGIC RHINITIS  Chronic obstructive asthma  Dysphagia  GERD (gastroesophageal reflux disease)  Bronchitis  Leukocytosis  Anemia  CKD (chronic kidney disease), stage III  Tachycardia  1. Acute on chronic respiratory failure: Possibly secondary to acute exacerbation of asthma/COPD. Improved. Taper steroids. Complete oral antibiotics. Continue bronchodilator therapy. 2. Type 2 diabetes mellitus: Hypoglycemic episode this morning. We'll reduce Lantus dose. Monitor. 3. Acute on chronic kidney disease: Improving. Management per nephrology. Nephrology input appreciated. 4. Hypertension: Mildly uncontrolled. Continue oral hydralazine, Cardizem and amlodipine. Echo shows mild LVH and EF of 55-60%. 5. Anemia: Stable 6. Hyperlipidemia: On statins.  Discussed with patient's spouse at the bedside and updated care.  Steffanie Mingle 05/22/2011,6:58 PM  LOS: 4 days

## 2011-05-22 NOTE — Progress Notes (Signed)
Inpatient Diabetes Program Recommendations  AACE/ADA: New Consensus Statement on Inpatient Glycemic Control (2009)  Target Ranges:  Prepandial:   less than 140 mg/dL      Peak postprandial:   less than 180 mg/dL (1-2 hours)      Critically ill patients:  140 - 180 mg/dL   Reason for Visit: Hypoglycemia  Results for Joann Gutierrez, Joann Gutierrez (MRN [id number]) as of 05/22/2011 10:55  Ref. Range 05/21/2011 11:39 05/21/2011 16:37 05/21/2011 21:38 05/22/2011 07:45 05/22/2011 08:06  Glucose-Capillary Latest Range: 70-99 mg/dL 103 (H) 140 (H) 183 (H) 55 (L) 62 (L)    Inpatient Diabetes Program Recommendations Insulin - Basal: Decrease Lantus to 40 units bid  Note: Will follow.

## 2011-05-23 DIAGNOSIS — E1169 Type 2 diabetes mellitus with other specified complication: Secondary | ICD-10-CM | POA: Diagnosis not present

## 2011-05-23 DIAGNOSIS — N179 Acute kidney failure, unspecified: Secondary | ICD-10-CM | POA: Diagnosis not present

## 2011-05-23 DIAGNOSIS — J449 Chronic obstructive pulmonary disease, unspecified: Secondary | ICD-10-CM | POA: Diagnosis not present

## 2011-05-23 DIAGNOSIS — E871 Hypo-osmolality and hyponatremia: Secondary | ICD-10-CM | POA: Diagnosis not present

## 2011-05-23 DIAGNOSIS — I10 Essential (primary) hypertension: Secondary | ICD-10-CM

## 2011-05-23 DIAGNOSIS — J9620 Acute and chronic respiratory failure, unspecified whether with hypoxia or hypercapnia: Secondary | ICD-10-CM

## 2011-05-23 DIAGNOSIS — I129 Hypertensive chronic kidney disease with stage 1 through stage 4 chronic kidney disease, or unspecified chronic kidney disease: Secondary | ICD-10-CM | POA: Diagnosis not present

## 2011-05-23 LAB — RENAL FUNCTION PANEL
Calcium: 8.6 mg/dL (ref 8.4–10.5)
Creatinine, Ser: 2.23 mg/dL — ABNORMAL HIGH (ref 0.50–1.10)
GFR calc Af Amer: 25 mL/min — ABNORMAL LOW (ref >90)
GFR calc non Af Amer: 22 mL/min — ABNORMAL LOW (ref >90)
Glucose, Bld: 51 mg/dL — ABNORMAL LOW (ref 70–99)
Phosphorus: 4.4 mg/dL (ref 2.3–4.6)
Sodium: 138 mEq/L (ref 135–145)

## 2011-05-23 LAB — GLUCOSE, CAPILLARY
Glucose-Capillary: 105 mg/dL — ABNORMAL HIGH (ref 70–99)
Glucose-Capillary: 129 mg/dL — ABNORMAL HIGH (ref 70–99)

## 2011-05-23 MED ORDER — SODIUM CHLORIDE 0.9 % IJ SOLN
3.0000 mL | Freq: Two times a day (BID) | INTRAMUSCULAR | Status: DC
  Administered 2011-05-23 (×2): 3 mL via INTRAVENOUS

## 2011-05-23 MED ORDER — INSULIN ASPART 100 UNIT/ML ~~LOC~~ SOLN: 0.0000 [IU] | Freq: Every day | SUBCUTANEOUS | Status: DC

## 2011-05-23 MED ORDER — INSULIN ASPART 100 UNIT/ML ~~LOC~~ SOLN
0.0000 [IU] | Freq: Three times a day (TID) | SUBCUTANEOUS | Status: DC
  Administered 2011-05-23: 1 [IU] via SUBCUTANEOUS
  Administered 2011-05-24: 9 [IU] via SUBCUTANEOUS
  Administered 2011-05-24: 1 [IU] via SUBCUTANEOUS

## 2011-05-23 MED ORDER — INSULIN GLARGINE 100 UNIT/ML ~~LOC~~ SOLN: 30.0000 [IU] | Freq: Every day | SUBCUTANEOUS | Status: DC

## 2011-05-23 NOTE — Progress Notes (Signed)
OT Note:  Pt had just returned from bathroom.  Will reattempt tomorrow, as schedule permits.  Discussed seat vs. Bench for tub.  Will continue to assess.  Also reviewed energy conservation with pt.  [place], OTR/L [id number] 05/23/2011

## 2011-05-23 NOTE — Progress Notes (Signed)
Pt placed on cpap 9 cmH2O with 2L O2 bleed in. Pt tolerating well at this time.  Kathie Dike RRT

## 2011-05-23 NOTE — Progress Notes (Signed)
Patient ID: Joann Gutierrez, female   DOB: [birth year], 66 y.o.   MRN: [id number]   [facility] Progress Note    Subjective:   Reports to be feeling better and has decreased SOB/Cough   Objective:   BP 163/62  Pulse 75  Temp(Src) 97.2 F (36.2 C) (Oral)  Resp 18  Ht 5\' 4"  (1.626 m)  Wt 120.711 kg (266 lb 1.9 oz)  BMI 45.68 kg/m2  SpO2 97%  Intake/Output Summary (Last 24 hours) at 05/23/11 1030 Last data filed at 05/23/11 [id number]  Gross per 24 hour  Intake    825 ml  Output   2400 ml  Net  -1575 ml   Weight change:   Physical Exam: [id number] resting in bed- watching TV [id number] RRR, normal S1 and S2  Resp: Coarse BS bilaterally, occasional wheeze audible [id number], obese, NT, BS normal Ext:1+ LE edema  Imaging: US Renal  05/21/2011  *RADIOLOGY REPORT*  Clinical Data: Acute on chronic renal failure  RENAL/URINARY TRACT ULTRASOUND  Technique: Renal ultrasound  Comparison:  11/12/2009  Findings: Right kidney measures 10 cm in length.  No hydronephrosis or diagnostic renal calculus.  A mid pole cyst measures 8 x 9 mm.  Left kidney measures 10.3 cm in length.  No hydronephrosis or diagnostic renal calculus.  A lower pole cyst measures 2.1 x 1.6 cm.  The urinary bladder is decompressed with Foley catheter.  IMPRESSION:  1.  No hydronephrosis or diagnostic renal calculus.  Small cyst is noted mid pole of the right kidney.  A cyst in lower pole of the left kidney measures 2.1 x 1.6 cm. 2.  Foley catheter in a decompressed urinary bladder.  Original Report Authenticated By: Lahoma Crocker, M.D.    Labs: BMET  Lab 05/23/11 0510 05/22/11 [id number] 05/21/11 1515 05/21/11 0535 05/20/11 0508 05/19/11 0231 05/18/11 2227  NA 138 133* 133* 133* 135 140 141  K 4.4 4.8 5.2* 5.1 5.2* 3.3* 3.9  CL 105 102 101 101 101 102 103  CO2 22 21 22  17* 23 23 30   GLUCOSE 51* 94 108* 100* 217* 337* 220*  BUN 60* 69* 71* 69* 49* 22 19  CREATININE 2.23* 3.16* 3.83* 4.17* 3.37* 1.59* 1.23*    ALB -- -- -- -- -- -- --  CALCIUM 8.6 8.3* 8.2* 8.4 8.7 8.9 10.0  PHOS 4.4 4.9* -- -- -- -- --   CBC  Lab 05/22/11 0513 05/21/11 0535 05/20/11 0508 05/19/11 0231  WBC 11.4* 13.2* 15.1* 14.8*  NEUTROABS -- -- -- --  HGB 9.8* 9.8* 10.1* 10.0*  HCT 31.3* 30.6* 32.1* 30.8*  MCV 72.5* 72.3* 72.6* 71.5*  PLT 260 221 240 245    Medications:      . albuterol  2.5 mg Nebulization Once  . amLODipine  5 mg Oral Daily  . aspirin EC  325 mg Oral Daily  . atorvastatin  40 mg Oral Daily  . citalopram  20 mg Oral Daily  . cyclobenzaprine  10 mg Oral Daily  . dicyclomine  10 mg Oral TID AC  . diltiazem  240 mg Oral Daily  . donepezil  5 mg Oral Daily  . ezetimibe  10 mg Oral Daily  . fluticasone  2 spray Each Nare Daily  . furosemide  80 mg Oral Daily  . gabapentin  300 mg Oral BID  . heparin subcutaneous  5,000 Units Subcutaneous Q8H  . hydrALAZINE  10 mg Oral TID  . insulin aspart  0-5 Units Subcutaneous QHS  .  insulin aspart  0-9 Units Subcutaneous TID WC  . insulin glargine  30 Units Subcutaneous Daily  . metoCLOPramide  10 mg Oral QID  . montelukast  10 mg Oral Daily  . moxifloxacin  400 mg Oral q1800  . pantoprazole  80 mg Oral BID AC  . predniSONE  50 mg Oral Q breakfast  . raloxifene  60 mg Oral Daily  . sodium bicarbonate  650 mg Oral TID  . DISCONTD: insulin aspart  0-20 Units Subcutaneous TID WC  . DISCONTD: insulin glargine  40 Units Subcutaneous BID  . DISCONTD: insulin glargine  45 Units Subcutaneous BID  . DISCONTD: predniSONE  60 mg Oral Q breakfast     Assessment/ Plan:   1. Acute renal failure on chronic kidney disease stage III: Based on the history, timeline of events and available data base surrounding this admission-I suspect that the mechanism of this renal injury was likely hemodynamically mediated with a relative hypotension/tachycardia early on during hospitalization. This is probably compounded by the fact that she was continued on her nonsteroidal  anti-inflammatory drugs and had preceding use of ARB/diuretic. Currently she is non-oliguric and now showing signs of renal recovery with improvement of creatinine/good urine output. Continue to monitor her closely-currently, without any acute electrolyte issues were volume concerns to necessitate consideration for renal replacement therapy. Will DC IV fluids. 2. Metabolic acidosis: Anion gap, secondary to acute renal failure. Bicarbonate level better on oral bicarbonate supplementation, and now discontinued.  3. Hyponatremia: Secondary to acute renal failure and impaired free water handling by an injured kidney. Improving with intravenous fluids/improving urine output.  4. . Acute exacerbation of chronic obstructive lung disease versus asthma: Management per hospitalist service, currently on bronchodilators and fluoroquinolone therapy-reports clinical improvement.   Will sign off for now, can follow up with PCP upon DC/renal recovery and see renal as needed in the future. Restart her Avapro when creatinine <2.0. Keep on lasix and avoid HCTZ   [name], MD 05/23/2011, 10:30 AM

## 2011-05-23 NOTE — Progress Notes (Signed)
Subjective:   Hypoglycemic episodes early this morning. Patient's home insulin regimen was clarified by the pharmacist and there seems to be some confusion as to what she was supposed to take and what she was taking. She seemed to have hypoglycemic episodes even at home. Not clear if she truly understands how she is to take her insulin. Says feeling better. Mild dyspnea on exertion. Decreased dizziness.  Objective  Vital signs in last 24 hours: Filed Vitals:   05/23/11 0600 05/23/11 1049 05/23/11 1050 05/23/11 1345  BP: 163/62 169/90 169/90 149/75  Pulse: 75  62 67  Temp: 97.2 F (36.2 C)  97.9 F (36.6 C) 98.5 F (36.9 C)  TempSrc: Oral  Oral Oral  Resp: 18  16 18   Height:      Weight:      SpO2: 97%  98% 98%   Weight change:   Intake/Output Summary (Last 24 hours) at 05/23/11 1358 Last data filed at 05/23/11 1247  Gross per 24 hour  Intake   1068 ml  Output   2800 ml  Net  -1732 ml    Physical Exam:  General Exam: Comfortable.  Respiratory System: Bilateral rhonchi but fair breath sounds. No increased work of breathing.  Cardiovascular System: First and second heart sounds heard. Regular rate and rhythm. No JVD/murmurs.  Gastrointestinal System: Abdomen is non distended, soft and normal bowel sounds heard. Non tender. Central Nervous System: Alert and oriented. No focal neurological deficits. Extremities: Symmetrical 5 x 5 power. 1+ pitting bilateral lower extremity edema  Labs:  Basic Metabolic Panel:  Lab [id number] 0510 05/22/11 0513 05/21/11 1515  NA 138 133* 133*  K 4.4 4.8 5.2*  CL 105 102 101  CO2 22 21 22   GLUCOSE 51* 94 108*  BUN 60* 69* 71*  CREATININE 2.23* 3.16* 3.83*  CALCIUM 8.6 8.3* 8.2*  ALB -- -- --  PHOS 4.4 4.9* --   Liver Function Tests:  Lab 05/23/11 0510 05/22/11 0513 05/18/11 2227  AST -- -- 20  ALT -- -- 12  ALKPHOS -- -- 118*  BILITOT -- -- 0.5  PROT -- -- 8.2  ALBUMIN 2.5* 2.5* 3.3*   No results found for this basename:  LIPASE:3,AMYLASE:3 in the last 168 hours No results found for this basename: AMMONIA:3 in the last 168 hours CBC:  Lab 05/22/11 0513 05/21/11 0535 05/20/11 0508 05/19/11 0231 05/18/11 2227  WBC 11.4* 13.2* 15.1* -- --  NEUTROABS -- -- -- -- --  HGB 9.8* 9.8* 10.1* -- --  HCT 31.3* 30.6* 32.1* -- --  MCV 72.5* 72.3* 72.6* 71.5* 71.4*  PLT 260 221 240 -- --   Cardiac Enzymes:  Lab 05/19/11 1318 05/19/11 0737 05/19/11 0231  CKTOTAL 760* 626* 664*  CKMB 3.7 3.0 2.8  CKMBINDEX -- -- --  TROPONINI <0.30 <0.30 <0.30   CBG:  Lab 05/23/11 1143 05/23/11 0814 05/23/11 0735 05/23/11 0734 05/22/11 2252  GLUCAP 78 105* 55* 56* 136*    Iron Studies: No results found for this basename: IRON,TIBC,TRANSFERRIN,FERRITIN in the last 72 hours Studies/Results: US Renal  05/21/2011  *RADIOLOGY REPORT*  Clinical Data: Acute on chronic renal failure  RENAL/URINARY TRACT ULTRASOUND  Technique: Renal ultrasound  Comparison:  11/12/2009  Findings: Right kidney measures 10 cm in length.  No hydronephrosis or diagnostic renal calculus.  A mid pole cyst measures 8 x 9 mm.  Left kidney measures 10.3 cm in length.  No hydronephrosis or diagnostic renal calculus.  A lower pole cyst measures 2.1 x  1.6 cm.  The urinary bladder is decompressed with Foley catheter.  IMPRESSION:  1.  No hydronephrosis or diagnostic renal calculus.  Small cyst is noted mid pole of the right kidney.  A cyst in lower pole of the left kidney measures 2.1 x 1.6 cm. 2.  Foley catheter in a decompressed urinary bladder.  Original Report Authenticated By: Lahoma Crocker, M.D.   Medications:    . DISCONTD: sodium chloride 75 mL/hr at 05/22/11 1145      . albuterol  2.5 mg Nebulization Once  . amLODipine  5 mg Oral Daily  . aspirin EC  325 mg Oral Daily  . atorvastatin  40 mg Oral Daily  . citalopram  20 mg Oral Daily  . cyclobenzaprine  10 mg Oral Daily  . dicyclomine  10 mg Oral TID AC  . diltiazem  240 mg Oral Daily  . donepezil  5 mg  Oral Daily  . ezetimibe  10 mg Oral Daily  . fluticasone  2 spray Each Nare Daily  . furosemide  80 mg Oral Daily  . gabapentin  300 mg Oral BID  . heparin subcutaneous  5,000 Units Subcutaneous Q8H  . hydrALAZINE  10 mg Oral TID  . insulin aspart  0-5 Units Subcutaneous QHS  . insulin aspart  0-9 Units Subcutaneous TID WC  . insulin glargine  30 Units Subcutaneous QHS  . metoCLOPramide  10 mg Oral QID  . montelukast  10 mg Oral Daily  . moxifloxacin  400 mg Oral q1800  . pantoprazole  80 mg Oral BID AC  . predniSONE  50 mg Oral Q breakfast  . raloxifene  60 mg Oral Daily  . sodium bicarbonate  650 mg Oral TID  . sodium chloride  3 mL Intravenous Q12H  . DISCONTD: insulin aspart  0-20 Units Subcutaneous TID WC  . DISCONTD: insulin glargine  30 Units Subcutaneous Daily  . DISCONTD: insulin glargine  40 Units Subcutaneous BID  . DISCONTD: insulin glargine  45 Units Subcutaneous BID  . DISCONTD: predniSONE  60 mg Oral Q breakfast    I  have reviewed scheduled and prn medications.     Problem/Plan: Principal Problem:  *Acute-on-chronic respiratory failure Active Problems:  DIABETES MELLITUS, TYPE II  HYPERCHOLESTEROLEMIA  OBSTRUCTIVE SLEEP APNEA  PERIPHERAL NEUROPATHY, FEET  ALLERGIC RHINITIS  Chronic obstructive asthma  Dysphagia  GERD (gastroesophageal reflux disease)  Bronchitis  Leukocytosis  Anemia  CKD (chronic kidney disease), stage III  Tachycardia  1. Acute on chronic respiratory failure: Possibly secondary to acute exacerbation of asthma/COPD. Improved. Taper steroids. Complete oral antibiotics. Continue bronchodilator therapy. 2. Type 2 diabetes mellitus: Recurrent hypoglycemic episodes. Reduce Lantus to 30 units subcutaneously at bedtime and place on sensitive sliding scale insulin. Diabetes coordinator consulted to educate patient regarding insulin management. 3. Acute on chronic kidney disease: Improving. Management per nephrology. Nephrology input  appreciated. Seeing IV fluids but continue Lasix. Discussed with Dr. Posey Pronto who recommends starting ARB at home does when creatinine less than 2. Etiology likely hemodynamically mediated. 4. Hypertension: Mildly uncontrolled. Continue oral hydralazine, Cardizem and amlodipine. Echo shows mild LVH and EF of 55-60%. 5. Anemia: Stable 6. Hyperlipidemia: On statins.  Discussed with patient's spouse at the bedside and updated care.  Disposition: Possible discharge in approximately 48 hours.  Calyse Murcia 05/23/2011,1:58 PM  LOS: 5 days

## 2011-05-24 DIAGNOSIS — J9620 Acute and chronic respiratory failure, unspecified whether with hypoxia or hypercapnia: Secondary | ICD-10-CM

## 2011-05-24 DIAGNOSIS — E1169 Type 2 diabetes mellitus with other specified complication: Secondary | ICD-10-CM

## 2011-05-24 DIAGNOSIS — N179 Acute kidney failure, unspecified: Secondary | ICD-10-CM

## 2011-05-24 DIAGNOSIS — I10 Essential (primary) hypertension: Secondary | ICD-10-CM

## 2011-05-24 LAB — GLUCOSE, CAPILLARY
Glucose-Capillary: 112 mg/dL — ABNORMAL HIGH (ref 70–99)
Glucose-Capillary: 143 mg/dL — ABNORMAL HIGH (ref 70–99)

## 2011-05-24 LAB — RENAL FUNCTION PANEL
Albumin: 2.4 g/dL — ABNORMAL LOW (ref 3.5–5.2)
Chloride: 104 mEq/L (ref 96–112)
Creatinine, Ser: 1.95 mg/dL — ABNORMAL HIGH (ref 0.50–1.10)
GFR calc non Af Amer: 26 mL/min — ABNORMAL LOW (ref >90)
Potassium: 4.2 mEq/L (ref 3.5–5.1)
Sodium: 139 mEq/L (ref 135–145)

## 2011-05-24 MED ORDER — FUROSEMIDE 80 MG PO TABS: 40.0000 mg | ORAL_TABLET | Freq: Two times a day (BID) | ORAL | Status: DC

## 2011-05-24 MED ORDER — LOSARTAN POTASSIUM 100 MG PO TABS: 100.0000 mg | ORAL_TABLET | Freq: Every day | ORAL | Status: DC

## 2011-05-24 MED ORDER — INSULIN ASPART 100 UNIT/ML ~~LOC~~ SOLN: SUBCUTANEOUS | Status: DC

## 2011-05-24 MED ORDER — INSULIN GLARGINE 100 UNIT/ML ~~LOC~~ SOLN: 30.0000 [IU] | Freq: Every day | SUBCUTANEOUS | Status: DC

## 2011-05-24 MED ORDER — PREDNISONE 10 MG PO TABS: ORAL_TABLET | ORAL | Status: AC

## 2011-05-24 NOTE — Progress Notes (Signed)
OT Note:  Pt working with PT.  Will reattempt later today or tomorrow as schedule permits.  [place], OTR/L [id number] 05/24/2011

## 2011-05-24 NOTE — Progress Notes (Signed)
Physical Therapy Treatment Patient Details Name: Joann Gutierrez MRN: [id number] DOB: May 21, 1945 Today's Date: 05/24/2011 Time: [id number] PT Time Calculation (min): 25 min  PT Assessment / Plan / Recommendation Comments on Treatment Session  Pt might D/C today pending tests.  Pt plans to D/C to home where a friend can assist 24/7.  Pt needs a bariatric RW as she does not have one.    Follow Up Recommendations  Supervision/Assistance - 24 hour;Home health PT    Equipment Recommendations  3 in 1 bedside comode;Rolling walker with 5" wheels;Other (comment) (Bariatric walker)    Frequency Min 3X/week   Plan Discharge plan remains appropriate    Precautions / Restrictions Precautions Precautions: Fall Restrictions Weight Bearing Restrictions: No   Pertinent Vitals/Pain     Mobility  Bed Mobility Bed Mobility: Supine to Sit Supine to Sit: 6: Modified independent (Device/Increase time) Transfers Transfers: Sit to Stand;Stand to Sit Sit to Stand: 4: Min guard;From bed;From chair/3-in-1 Stand to Sit: 4: Min guard;To chair/3-in-1 Details for Transfer Assistance: 25% VC's on hand placement and safety with turns Ambulation/Gait Ambulation/Gait Assistance: 4: Min guard Ambulation Distance (Feet): 125 Feet Assistive device: Rolling walker;Other (Comment) (bariatric RW) Ambulation/Gait Assistance Details: 25% VC's on proper walker to self distance, upright posture and safety with turns. Gait Pattern: Wide base of support;Step-through pattern;Decreased stride length;Decreased hip/knee flexion - right;Decreased hip/knee flexion - left;Antalgic Stairs: Yes Stairs Assistance: 4: Min guard Stair Management Technique: Two rails Number of Stairs: 2  Wheelchair Mobility Wheelchair Mobility: No    Exercises     PT Goals Acute Rehab PT Goals PT Goal Formulation: With patient Potential to Achieve Goals: Good Pt will go Sit to Stand: with modified independence PT Goal: Sit to Stand -  Progress: Progressing toward goal Pt will go Stand to Sit: with modified independence PT Goal: Stand to Sit - Progress: Progressing toward goal Pt will Ambulate: 51 - 150 feet;with modified independence;with least restrictive assistive device PT Goal: Ambulate - Progress: Progressing toward goal  Visit Information  Last PT Received On: 05/24/11 Assistance Needed: +1    Subjective Data  Subjective: "I might go home today" Patient Stated Goal: home   Cognition  Overall Cognitive Status: Appears within functional limits for tasks assessed/performed Arousal/Alertness: Awake/alert Orientation Level: Appears intact for tasks assessed Behavior During Session: [facility] for tasks performed    Balance     End of Session PT - End of Session Equipment Utilized During Treatment: Gait belt Activity Tolerance: Patient limited by fatigue Patient left: in chair;with call bell/phone within reach Nurse Communication: Mobility status    Rica Koyanagi  PTA [facility]  Acute  Rehab Pager     [phone]

## 2011-05-24 NOTE — Progress Notes (Signed)
Occupational Therapy Treatment Patient Details Name: Joann Gutierrez MRN: [id number] DOB: 02/21/1945 Today's Date: 05/24/2011 Time: [id number] OT Time Calculation (min): 18 min  OT Assessment / Plan / Recommendation Comments on Treatment Session Pt attempted tub transfer but is currently not safe to step over a tub. Her legs are shaky. She declines purchasing a tubbench at this time and states she will sponge. She is requesting a 3in1 for home. She has a toilet riser without handles but it is only loaned to her. She reports she has fallen off the comoode at home before and feels armrests will be safer.     Follow Up Recommendations  No OT follow up    Equipment Recommendations  Rolling walker with 5" wheels;3 in 1 bedside comode    Frequency Min 2X/week   Plan Discharge plan remains appropriate    Precautions / Restrictions Precautions Precautions: Fall        ADL  Tub/Shower Transfer: Simulated;Moderate assistance;Other (comment) (pt unsteady with stepping over. see below) Tub/Shower Transfer Method: Ambulating ADL Comments: Discussed tubbench versus seat options. Pt practiced with holding to door frame and simulated a step over a trash can (laid on side) and she was able to step over with her R LE but was unsteady and required mod assist. She attempted to pick up her left LE and step over but wasnt able to . Advised pt to not try stepping over a tub at home right now. Pt states even PTA she had times she couldnt step into tub and had to sponge bathe. It has been a few weeks since she has stepped in a tub. Pt states she wouldnt be able to purchase tubbench at this time and agrees to sponge for now. Pt reports she has a standard height commode and has been loaned a toilet riser without handles. Case manager came by and  she requested a 3in1 to have  herself.     OT Goals ADL Goals ADL Goal: Tub/Shower Transfer - Progress: Discontinued (comment) (pt declines a tubbench that would be  needed to transfer. )  Visit Information  Last OT Received On: 05/24/11 Assistance Needed: +1 Reason Eval/Treat Not Completed: Other (comment) (pt is working with PT.  Will reattempt later)    Subjective Data  Subjective: I have [name] who can help me Patient Stated Goal: none stated. agreeable to work with OT   Prior Functioning       Cognition  Overall Cognitive Status: Appears within functional limits for tasks assessed/performed Arousal/Alertness: Awake/alert Orientation Level: Appears intact for tasks assessed Behavior During Session: [facility] for tasks performed    Mobility Transfers Transfers: Sit to Stand;Stand to Sit Sit to Stand: 4: Min guard;With upper extremity assist;From chair/3-in-1 Stand to Sit: 4: Min guard;With upper extremity assist;To chair/3-in-1   Exercises    Balance    End of Session OT - End of Session Equipment Utilized During Treatment: Gait belt Activity Tolerance: Patient tolerated treatment well Patient left: in chair;with call bell/phone within reach   Jules Schick [id number] 05/24/2011, 11:47 AM

## 2011-05-24 NOTE — Progress Notes (Signed)
   CARE MANAGEMENT NOTE 05/24/2011  Patient:  SERENITI, HOUCHENS   Account Number:  [id number]  Date Initiated:  05/24/2011  Documentation initiated by:  [name]  Subjective/Objective Assessment:   PT ADM WITH ACUTE ON CHRONIC RESP FAILURE     Action/Plan:   LIVES WITH SIGNIFICANT OTHER   Anticipated DC Date:  05/24/2011   Anticipated DC Plan:  [place]  In-house referral  NA      DC Planning Services  CM consult      [facility]  HOME HEALTH  DURABLE MEDICAL EQUIPMENT   Choice offered to / List presented to:  C-1 Patient   DME arranged  3-N-1  Vassie Moselle      DME agency  [place] arranged  HH-1 RN  [place] agency  [place]   Status of service:  Completed, signed off Medicare Important Message given?   (If response is "NO", the following Medicare IM given date fields will be blank) Date Medicare IM given:   Date Additional Medicare IM given:    Discharge Disposition:  [place]  Per UR Regulation:  Reviewed for med. necessity/level of care/duration of stay  If discussed at [place] of Stay Meetings, dates discussed:    Comments:  05-24-11 [name], RN,BSN,CM [phone] Pt to dc home today with [facility] services. Golden Circle with [facility] made this CM aware yesterday that Lorenza Chick is following for [facility] needs. Orders rec'd from PT/OT/RN, 3 in 1 and rw. Mary with [facility] aware for services requested. Lecretia with [facility] was asked by [facility] to provide DME. Pt aware and agreeable to dc plans. Called to make f/u appt with Dr Delfina Redwood but this CM was unable to at the time. Had to leave vm for scheduler to call pt back with f/u appt soon as pt to dc today. Pt made aware to call on Monday to f/u for appt f/u. No further needs assessed.

## 2011-05-24 NOTE — Discharge Summary (Signed)
Discharge Summary  Joann Gutierrez MR#: [id number]  DOB:[birth year]  Date of Admission: 05/18/2011 Date of Discharge: 05/24/2011  Patient's PCP: Kandice Hams, MD, MD  Attending Physician:Salimah Martinovich  Consults: Nephrology: Dr. [name].    Discharge Diagnoses: Principal Problem:  *Acute-on-chronic respiratory failure Active Problems:  DIABETES MELLITUS, TYPE II  HYPERCHOLESTEROLEMIA  OBSTRUCTIVE SLEEP APNEA  PERIPHERAL NEUROPATHY, FEET  ALLERGIC RHINITIS  Chronic obstructive asthma  Dysphagia  GERD (gastroesophageal reflux disease)  Bronchitis  Leukocytosis  Anemia  CKD (chronic kidney disease), stage III  Tachycardia  Hypoglycemia  Brief Admitting History and Physical 66 y.o. African American female with history of depression, type 2 diabetes, GERD, hypertension, hyperlipidemia, CKD, coronary disease, obstructive sleep apnea on CPAP, asthma, rhinitis with postnasal drip, obesity presents with dyspnea. Reports that her symptoms started approximately 3 days prior top admission with cough productive for green sputum, shortness of breath, with increasing wheezing. She also reported having chest pain and abdominal pain with cough. Given her breathing was not improving she presented to the emergency department for further evaluation. Chest x-ray showed possible pneumonia versus bronchitis as a result hospitalist service was asked to admit the patient for further evaluation.    Discharge Medications Current Discharge Medication List    START taking these medications   Details  insulin aspart (NOVOLOG) 100 UNIT/ML injection 0-9 Units, Subcutaneous, 3 times daily with meals, CBG(finger stick blood sugar) < 70: eat or drink something sweet and recheck blood sugar. CBG 70 - 120: 0 units, CBG 121 - 150: 1 unit, CBG 151 - 200: 2 units, CBG 201 - 250: 3 units, CBG 251 - 300: 5 units, CBG 301 - 350: 7 units, CBG 351 - 400: 9 units, CBG > 400: call MD. Otho Darner: 1 vial, Refills: 0      losartan (COZAAR) 100 MG tablet Take 1 tablet (100 mg total) by mouth daily. Qty: 30 tablet, Refills: 0    predniSONE (DELTASONE) 10 MG tablet Take 4 tablets daily for 3 days, then 3 tablets daily for 3 days, then 2 tablets daily for 3 days, then 1 tablet daily for 3 days, then discontinue. Qty: 30 tablet, Refills: 0      CONTINUE these medications which have CHANGED   Details  furosemide (LASIX) 80 MG tablet Take 0.5 tablets (40 mg total) by mouth 2 (two) times daily. Qty: 30 tablet, Refills: 0    insulin glargine (LANTUS) 100 UNIT/ML injection Inject 30 Units into the skin at bedtime. Qty: 10 mL, Refills: 0      CONTINUE these medications which have NOT CHANGED   Details  albuterol (VENTOLIN HFA) 108 (90 BASE) MCG/ACT inhaler Inhale 2 puffs into the lungs every 6 (six) hours as needed for shortness of breath. Qty: 1 Inhaler, Refills: 6    amLODipine (NORVASC) 5 MG tablet Take 1 tablet by mouth daily.    aspirin 81 MG tablet Take 81 mg by mouth daily.      atorvastatin (LIPITOR) 40 MG tablet Take 1 tablet (40 mg total) by mouth daily. Qty: 30 tablet, Refills: 6    budesonide-formoterol (SYMBICORT) 160-4.5 MCG/ACT inhaler Inhale 2 puffs into the lungs 2 (two) times daily. Qty: 1 Inhaler, Refills: 6    Calcium Carbonate-Vit D-Min (CALTRATE PLUS PO) Take 600 mg by mouth daily.      citalopram (CELEXA) 20 MG tablet Take 20 mg by mouth daily.      cyclobenzaprine (FLEXERIL) 10 MG tablet Take 1 tablet (10 mg total) by mouth daily. Qty: 30  tablet, Refills: 5    dicyclomine (BENTYL) 10 MG capsule Take 1 tablet by mouth Three times a day before meals.    diltiazem (CARDIZEM CD) 240 MG 24 hr capsule Take 240 mg by mouth daily.      donepezil (ARICEPT) 5 MG tablet Take 1 tablet (5 mg total) by mouth daily. Qty: 30 tablet, Refills: 5    ezetimibe (ZETIA) 10 MG tablet Take 10 mg by mouth daily.      fluocinonide (LIDEX) 0.05 % gel Apply topically 2 (two) times daily.       fluticasone (FLONASE) 50 MCG/ACT nasal spray Place 2 sprays into the nose daily. Qty: 16 g, Refills: 11    gabapentin (NEURONTIN) 300 MG capsule Take 1 capsule (300 mg total) by mouth 2 (two) times daily. Qty: 60 capsule, Refills: 5    Insulin Syringe-Needle U-100 (B-D INS SYR ULTRAFINE 1CC/31G) 31G X 5/16" 1 ML MISC Use as directecd to inject insulin Qty: 100 each, Refills: 5    metoCLOPramide (REGLAN) 10 MG tablet Take 10 mg by mouth 4 (four) times daily.      Multiple Vitamin (MULTIVITAMIN PO) Take 1 tablet by mouth daily.      omeprazole (PRILOSEC) 20 MG capsule Take 40 mg by mouth daily.     raloxifene (EVISTA) 60 MG tablet Take 60 mg by mouth daily.      SINGULAIR 10 MG tablet Take 1 tablet by mouth daily.    TraMADol HCl 50 MG TBSO Take 1 tablet by mouth 4 (four) times daily as needed.        STOP taking these medications     diclofenac (VOLTAREN) 75 MG EC tablet Comments:  Reason for Stopping:       fluconazole (DIFLUCAN) 100 MG tablet Comments:  Reason for Stopping:       hydrALAZINE (APRESOLINE) 10 MG tablet Comments:  Reason for Stopping:       insulin NPH (HUMULIN N,NOVOLIN N) 100 UNIT/ML injection Comments:  Reason for Stopping:       losartan-hydrochlorothiazide (HYZAAR) 100-25 MG per tablet Comments:  Reason for Stopping:       meloxicam (MOBIC) 15 MG tablet Comments:  Reason for Stopping:       metFORMIN (GLUCOPHAGE) 500 MG tablet Comments:  Reason for Stopping:       metoprolol (LOPRESSOR) 50 MG tablet Comments:  Reason for Stopping:       potassium chloride SA (K-DUR,KLOR-CON) 20 MEQ tablet Comments:  Reason for Stopping:       sucralfate (CARAFATE) 1 GM/10ML suspension Comments:  Reason for Stopping:       ibuprofen (ADVIL,MOTRIN) 800 MG tablet Comments:  Reason for Stopping:       PEG 3350-KCl-NaBcb-NaCl-NaSulf (PEG 3350/ELECTROLYTES) 240 G SOLR Comments:  Reason for Stopping:          Hospital Course: Acute-on-chronic respiratory  failure Present on Admission:  .Acute-on-chronic respiratory failure .Bronchitis .DIABETES MELLITUS, TYPE II .Leukocytosis .Anemia .HYPERCHOLESTEROLEMIA .OBSTRUCTIVE SLEEP APNEA .PERIPHERAL NEUROPATHY, FEET .CKD (chronic kidney disease), stage III .Chronic obstructive asthma .ALLERGIC RHINITIS .GERD (gastroesophageal reflux disease) .Dysphagia .Tachycardia   1. Acute on chronic respiratory failure: Possibly secondary to acute exacerbation of asthma/COPD. Patient was treated with antibiotics, steroids and bronchodilator nebulization's. She made steady improvement and currently denies SOB. She will be discharged on steroid taper and will continue her prior home regimen. 2. Type 2 diabetes mellitus: Recurrent hypoglycemic episodes. Patient had been on a weird insulin regimen where she took Lantus in am, NPH in  pm and lantus prn!. She volunteered to low blood sugars at home. She and care taker were counseled regarding appropriate insulin regimen. Reduced Lantus to 30 units at bedtime and added SSI. Her hemoglobin A1c is in excess of 11 indicating poor outpatient control. Her blood sugars at home are probably fluctuating between high and low. Her regimen will need to be closely followed and adjusted as outpatient as deemed necessary. 3. Acute on chronic kidney disease: She presented to the hospital with what appeared to be acute exacerbation of chronic obstructive lung disease and initially was also thought to have been suffering from mild volume depletion prompting cessation of ARB/diuretic therapy. In the first 24-48 hours, she suffered significant tachycardia with relative hypotension which was followed by acute kidney injury with rise of creatinine from 1.6 on admission to 3.3 the next day and now to 4.17. Also noted in this interim period was decreased of her urine output and emergence of pedal edema prompting furosemide to be restarted. No contrast exposure is noted and no suspicious antibiotic  exposure noted. Unfortunately, she was continued on diclofenac therapy. Based on the history, timeline of events and available data base surrounding this admission- nephrology suspected that the mechanism of this renal injury was likely hemodynamically mediated with a relative hypotension/tachycardia early on during hospitalization. This is probably compounded by the fact that she was continued on her nonsteroidal anti-inflammatory drugs and had preceding use of ARB/diuretic. She was placed on a bicarbonate drip, BMPs were closely followed. With these measures patient's renal failure gradually improved. Discussed with nephrology who have cleared her for discharge with reduced dose of Lasix. They will follow her in their offices. 4. Hypertension: Mildly uncontrolled. Patient on polypharmacy at home. Discussed with nephrology and will minimize some of these medications which can be adjusted as outpatient as deemed necessary. Echo shows mild LVH and EF of 55-60%.  5. Anemia: Stable  6. Hyperlipidemia: On statins 7. Metabolic acidosis: Secondary to acute renal failure. Resolved. 8. Hyperkalemia: Secondary to acute renal failure and percussion supplements. Resolved. 9. Hyponatremia secondary to acute renal failure: Resolved. 10. OSA. Continue nightly CPAP. 11. GERD: Continue PPIs. 12. Chronic diastolic congestive heart failure: Compensated. Continue Lasix. ARB restarted.   Day of Discharge BP 156/72  Pulse 79  Temp(Src) 98.3 F (36.8 C) (Oral)  Resp 18  Ht 5\' 4"  (1.626 m)  Wt 127.461 kg (281 lb)  BMI 48.23 kg/m2  SpO2 93%  General Exam: Comfortable. Ambulating with physical therapy Respiratory System: Clear to auscultation. No increased work of breathing.  Cardiovascular System: First and second heart sounds heard. Regular rate and rhythm. No JVD/murmurs. Telemetry shows sinus rhythm with occasional PVCs. Gastrointestinal System: Abdomen is non distended, soft and normal bowel sounds heard. Non  tender.  Central Nervous System: Alert and oriented. No focal neurological deficits.  Extremities: Symmetrical 5 x 5 power. 1+ pitting bilateral lower extremity edema  Basic Metabolic Panel:  Lab [id number] 0515 05/23/11 0510 05/22/11 0513  NA 139 138 133*  K 4.2 4.4 4.8  CL 104 105 102  CO2 24 22 21   GLUCOSE 88 51* 94  BUN 51* 60* 69*  CREATININE 1.95* 2.23* 3.16*  CALCIUM 8.6 8.6 8.3*  ALB -- -- --  PHOS 3.7 4.4 4.9*   Liver Function Tests:  Lab 05/24/11 0515 05/23/11 0510 05/22/11 0513 05/18/11 2227  AST -- -- -- 20  ALT -- -- -- 12  ALKPHOS -- -- -- 118*  BILITOT -- -- -- 0.5  PROT -- -- --  8.2  ALBUMIN 2.4* 2.5* 2.5* --   No results found for this basename: LIPASE:3,AMYLASE:3 in the last 168 hours No results found for this basename: AMMONIA:3 in the last 168 hours CBC:  Lab 05/22/11 0513 05/21/11 0535 05/20/11 0508 05/19/11 0231 05/18/11 2227  WBC 11.4* 13.2* 15.1* -- --  NEUTROABS -- -- -- -- --  HGB 9.8* 9.8* 10.1* -- --  HCT 31.3* 30.6* 32.1* -- --  MCV 72.5* 72.3* 72.6* 71.5* 71.4*  PLT 260 221 240 -- --   Cardiac Enzymes:  Lab 05/24/11 0515 05/19/11 1318 05/19/11 0737 05/19/11 0231  CKTOTAL 80 760* 626* 664*  CKMB -- 3.7 3.0 2.8  CKMBINDEX -- -- -- --  TROPONINI -- <0.30 <0.30 <0.30   CBG:  Lab 05/24/11 1721 05/24/11 1713 05/24/11 1141 05/24/11 0745 05/23/11 2205  GLUCAP 479* 520* 143* 112* 165*   Other lab data:  1. Hemoglobin A1c: 11.3. 2. Influenza A&B and H1N1 by PCR negative. 3. Urinalysis was negative for features of urinary tract infection. 4. Total urine protein 5.3.  Dg Chest 2 View  05/18/2011  *RADIOLOGY REPORT*  Clinical Data: Productive cough.  Hypertension.  Diabetes.  History of congestive heart failure.  CHEST - 2 VIEW  Comparison: 07/04/2010 and previous  Findings: Heart size is normal.  The aorta is unfolded.  There is bronchial thickening.  There may be minimal patchy density at the right lung base.  No definite pneumonia.  No  consolidation or major collapse.  No effusions.  No significant bony finding.  No evidence of heart failure.  IMPRESSION: Bronchitis.  Question minimal patchy infiltrate at the right base. This is not definite.  This could represent mild pneumonia.  Original Report Authenticated By: Jules Schick, M.D.   US Renal  05/21/2011  *RADIOLOGY REPORT*  Clinical Data: Acute on chronic renal failure  RENAL/URINARY TRACT ULTRASOUND  Technique: Renal ultrasound  Comparison:  11/12/2009  Findings: Right kidney measures 10 cm in length.  No hydronephrosis or diagnostic renal calculus.  A mid pole cyst measures 8 x 9 mm.  Left kidney measures 10.3 cm in length.  No hydronephrosis or diagnostic renal calculus.  A lower pole cyst measures 2.1 x 1.6 cm.  The urinary bladder is decompressed with Foley catheter.  IMPRESSION:  1.  No hydronephrosis or diagnostic renal calculus.  Small cyst is noted mid pole of the right kidney.  A cyst in lower pole of the left kidney measures 2.1 x 1.6 cm. 2.  Foley catheter in a decompressed urinary bladder.  Original Report Authenticated By: Lahoma Crocker, M.D.   Dg Chest Port 1 View  05/19/2011  *RADIOLOGY REPORT*  Clinical Data: Pneumonia.  Shortness of breath  PORTABLE CHEST - 1 VIEW  Comparison: 05/18/2011  Findings: Heart size appears mildly enlarged.  No pleural effusion or edema identified.  There is no airspace consolidation identified.  Lung volumes are low and there is slight asymmetric elevation of the right hemidiaphragm.  IMPRESSION:  1.  Mild cardiac enlargement. 2.  Low lung volumes and asymmetric elevation of the right hemidiaphragm.  Original Report Authenticated By: Angelita Ingles, M.D.   2-D echocardiogram  Study Conclusions  - Left ventricle: The cavity size was normal. Wall thickness was increased in a pattern of mild LVH. Systolic function was normal. The estimated ejection fraction was in the range of 55% to 60%. Wall motion was normal; there were no regional  wall motion abnormalities. - Right ventricle: Systolic function was mildly reduced. -  Right atrium: The atrium was mildly dilated. - Tricuspid valve: Mild-moderate regurgitation. - Pulmonary arteries: Systolic pressure was mildly increased. PA peak pressure: 9mm Hg (S).    Disposition: Discharged home in stable condition.  Diet: Heart healthy and diabetic.  Activity: Increase activity gradually.  Follow-up Appts: Discharge Orders    Future Orders Please Complete By Expires   Diet - low sodium heart healthy      Diet Carb Modified      Increase activity slowly      Call MD for:  difficulty breathing, headache or visual disturbances         TESTS THAT NEED FOLLOW-UP BMP on followup with the nephrologist.  Time spent on discharge, talking to the patient, and coordinating care: 45 mins.   SignedVernell Leep, MD 05/24/2011, 7:20 PM

## 2011-05-26 DIAGNOSIS — E1149 Type 2 diabetes mellitus with other diabetic neurological complication: Secondary | ICD-10-CM | POA: Diagnosis not present

## 2011-05-26 DIAGNOSIS — J9600 Acute respiratory failure, unspecified whether with hypoxia or hypercapnia: Secondary | ICD-10-CM | POA: Diagnosis not present

## 2011-05-26 DIAGNOSIS — E1142 Type 2 diabetes mellitus with diabetic polyneuropathy: Secondary | ICD-10-CM | POA: Diagnosis not present

## 2011-05-26 DIAGNOSIS — Z8673 Personal history of transient ischemic attack (TIA), and cerebral infarction without residual deficits: Secondary | ICD-10-CM | POA: Diagnosis not present

## 2011-05-26 DIAGNOSIS — Z9181 History of falling: Secondary | ICD-10-CM | POA: Diagnosis not present

## 2011-05-26 DIAGNOSIS — J45901 Unspecified asthma with (acute) exacerbation: Secondary | ICD-10-CM | POA: Diagnosis not present

## 2011-05-26 DIAGNOSIS — R262 Difficulty in walking, not elsewhere classified: Secondary | ICD-10-CM | POA: Diagnosis not present

## 2011-05-26 DIAGNOSIS — E669 Obesity, unspecified: Secondary | ICD-10-CM | POA: Diagnosis not present

## 2011-05-26 DIAGNOSIS — M6281 Muscle weakness (generalized): Secondary | ICD-10-CM | POA: Diagnosis not present

## 2011-05-26 DIAGNOSIS — F0280 Dementia in other diseases classified elsewhere without behavioral disturbance: Secondary | ICD-10-CM | POA: Diagnosis not present

## 2011-05-26 DIAGNOSIS — I129 Hypertensive chronic kidney disease with stage 1 through stage 4 chronic kidney disease, or unspecified chronic kidney disease: Secondary | ICD-10-CM | POA: Diagnosis not present

## 2011-05-26 DIAGNOSIS — J441 Chronic obstructive pulmonary disease with (acute) exacerbation: Secondary | ICD-10-CM | POA: Diagnosis not present

## 2011-05-26 IMAGING — CR DG CHEST 2V
2 series · 2 of 2 positions shown · non-contrast
Comparison: 01/23/2010 and 12/05/2009.

CLINICAL DATA: Cough, shortness of breath and chest pain.

CHEST - 2 VIEW

[view not recorded (1 of 2)]
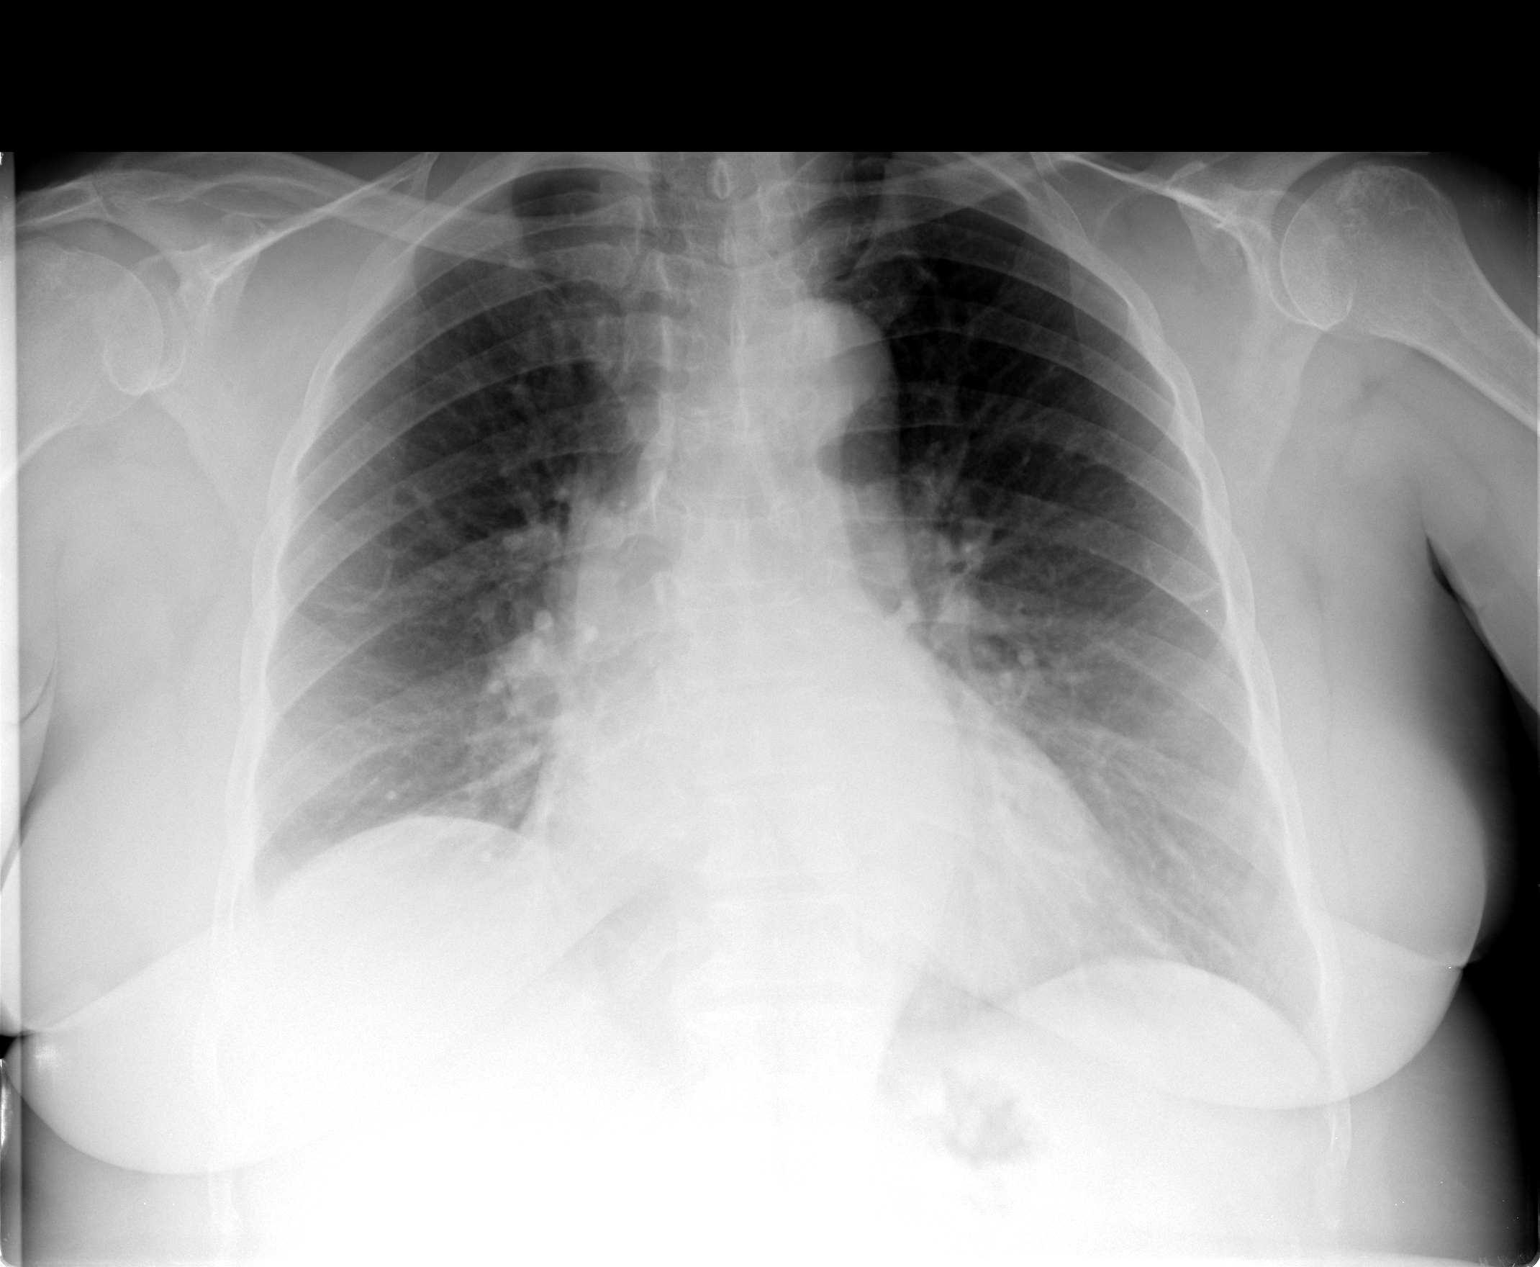

[view not recorded (2 of 2)]
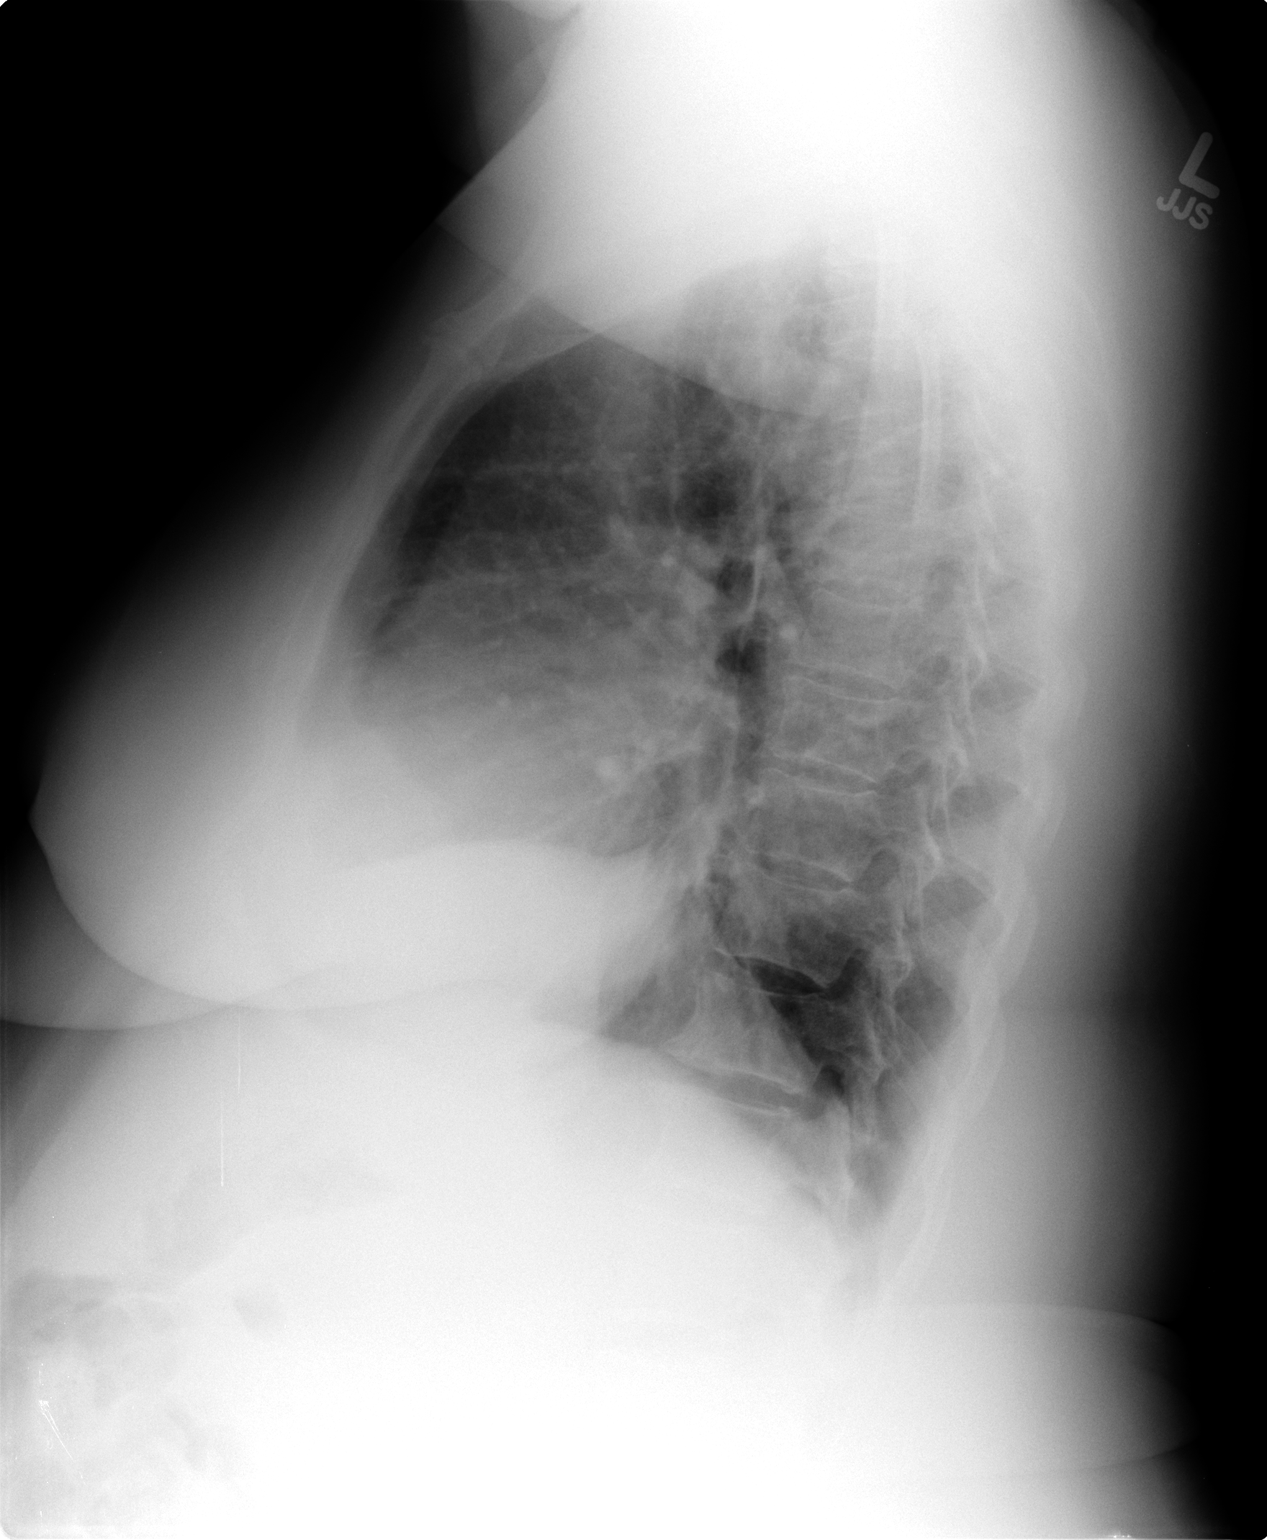

[2 of 2 positions shown; findings below may reference images not displayed]

FINDINGS: The heart size and mediastinal contours are stable.
There is chronic elevation of the right hemidiaphragm.  Right
basilar aeration has improved with mild residual streaky
atelectasis or scarring.  There is no airspace disease, edema or
pleural effusion.
IMPRESSION: Mildly improved right basilar aeration.  No acute findings
demonstrated.

## 2011-05-28 DIAGNOSIS — I129 Hypertensive chronic kidney disease with stage 1 through stage 4 chronic kidney disease, or unspecified chronic kidney disease: Secondary | ICD-10-CM | POA: Diagnosis not present

## 2011-05-28 DIAGNOSIS — E1129 Type 2 diabetes mellitus with other diabetic kidney complication: Secondary | ICD-10-CM | POA: Diagnosis not present

## 2011-05-28 DIAGNOSIS — E1149 Type 2 diabetes mellitus with other diabetic neurological complication: Secondary | ICD-10-CM | POA: Diagnosis not present

## 2011-05-28 DIAGNOSIS — J441 Chronic obstructive pulmonary disease with (acute) exacerbation: Secondary | ICD-10-CM | POA: Diagnosis not present

## 2011-05-28 DIAGNOSIS — J9600 Acute respiratory failure, unspecified whether with hypoxia or hypercapnia: Secondary | ICD-10-CM | POA: Diagnosis not present

## 2011-05-28 DIAGNOSIS — J209 Acute bronchitis, unspecified: Secondary | ICD-10-CM | POA: Diagnosis not present

## 2011-05-28 DIAGNOSIS — I10 Essential (primary) hypertension: Secondary | ICD-10-CM | POA: Diagnosis not present

## 2011-05-28 DIAGNOSIS — E1142 Type 2 diabetes mellitus with diabetic polyneuropathy: Secondary | ICD-10-CM | POA: Diagnosis not present

## 2011-05-29 ENCOUNTER — Other Ambulatory Visit: Payer: Self-pay | Admitting: *Deleted

## 2011-05-29 DIAGNOSIS — I129 Hypertensive chronic kidney disease with stage 1 through stage 4 chronic kidney disease, or unspecified chronic kidney disease: Secondary | ICD-10-CM | POA: Diagnosis not present

## 2011-05-29 DIAGNOSIS — J9600 Acute respiratory failure, unspecified whether with hypoxia or hypercapnia: Secondary | ICD-10-CM | POA: Diagnosis not present

## 2011-05-29 DIAGNOSIS — E1142 Type 2 diabetes mellitus with diabetic polyneuropathy: Secondary | ICD-10-CM | POA: Diagnosis not present

## 2011-05-29 DIAGNOSIS — J45901 Unspecified asthma with (acute) exacerbation: Secondary | ICD-10-CM | POA: Diagnosis not present

## 2011-05-29 DIAGNOSIS — J441 Chronic obstructive pulmonary disease with (acute) exacerbation: Secondary | ICD-10-CM | POA: Diagnosis not present

## 2011-05-29 DIAGNOSIS — E1149 Type 2 diabetes mellitus with other diabetic neurological complication: Secondary | ICD-10-CM | POA: Diagnosis not present

## 2011-05-29 MED ORDER — "INSULIN SYRINGE-NEEDLE U-100 31G X 5/16"" 1 ML MISC": Status: DC

## 2011-05-29 NOTE — Telephone Encounter (Signed)
R'cd fax from [place] for refill of insulin syringes.

## 2011-05-30 DIAGNOSIS — E1149 Type 2 diabetes mellitus with other diabetic neurological complication: Secondary | ICD-10-CM | POA: Diagnosis not present

## 2011-05-30 DIAGNOSIS — J9600 Acute respiratory failure, unspecified whether with hypoxia or hypercapnia: Secondary | ICD-10-CM | POA: Diagnosis not present

## 2011-05-30 DIAGNOSIS — E1142 Type 2 diabetes mellitus with diabetic polyneuropathy: Secondary | ICD-10-CM | POA: Diagnosis not present

## 2011-05-30 DIAGNOSIS — J441 Chronic obstructive pulmonary disease with (acute) exacerbation: Secondary | ICD-10-CM | POA: Diagnosis not present

## 2011-05-30 DIAGNOSIS — I129 Hypertensive chronic kidney disease with stage 1 through stage 4 chronic kidney disease, or unspecified chronic kidney disease: Secondary | ICD-10-CM | POA: Diagnosis not present

## 2011-06-04 DIAGNOSIS — L608 Other nail disorders: Secondary | ICD-10-CM | POA: Diagnosis not present

## 2011-06-04 DIAGNOSIS — E1149 Type 2 diabetes mellitus with other diabetic neurological complication: Secondary | ICD-10-CM | POA: Diagnosis not present

## 2011-06-04 DIAGNOSIS — E1142 Type 2 diabetes mellitus with diabetic polyneuropathy: Secondary | ICD-10-CM | POA: Diagnosis not present

## 2011-06-04 DIAGNOSIS — M79609 Pain in unspecified limb: Secondary | ICD-10-CM | POA: Diagnosis not present

## 2011-06-04 DIAGNOSIS — E109 Type 1 diabetes mellitus without complications: Secondary | ICD-10-CM | POA: Diagnosis not present

## 2011-06-04 DIAGNOSIS — I129 Hypertensive chronic kidney disease with stage 1 through stage 4 chronic kidney disease, or unspecified chronic kidney disease: Secondary | ICD-10-CM | POA: Diagnosis not present

## 2011-06-04 DIAGNOSIS — J441 Chronic obstructive pulmonary disease with (acute) exacerbation: Secondary | ICD-10-CM | POA: Diagnosis not present

## 2011-06-04 DIAGNOSIS — B351 Tinea unguium: Secondary | ICD-10-CM | POA: Diagnosis not present

## 2011-06-04 DIAGNOSIS — J9600 Acute respiratory failure, unspecified whether with hypoxia or hypercapnia: Secondary | ICD-10-CM | POA: Diagnosis not present

## 2011-06-05 DIAGNOSIS — E1129 Type 2 diabetes mellitus with other diabetic kidney complication: Secondary | ICD-10-CM | POA: Diagnosis not present

## 2011-06-05 DIAGNOSIS — I10 Essential (primary) hypertension: Secondary | ICD-10-CM | POA: Diagnosis not present

## 2011-06-06 DIAGNOSIS — J45901 Unspecified asthma with (acute) exacerbation: Secondary | ICD-10-CM | POA: Diagnosis not present

## 2011-06-06 DIAGNOSIS — I129 Hypertensive chronic kidney disease with stage 1 through stage 4 chronic kidney disease, or unspecified chronic kidney disease: Secondary | ICD-10-CM | POA: Diagnosis not present

## 2011-06-06 DIAGNOSIS — E1149 Type 2 diabetes mellitus with other diabetic neurological complication: Secondary | ICD-10-CM | POA: Diagnosis not present

## 2011-06-06 DIAGNOSIS — E1142 Type 2 diabetes mellitus with diabetic polyneuropathy: Secondary | ICD-10-CM | POA: Diagnosis not present

## 2011-06-06 DIAGNOSIS — J9600 Acute respiratory failure, unspecified whether with hypoxia or hypercapnia: Secondary | ICD-10-CM | POA: Diagnosis not present

## 2011-06-07 DIAGNOSIS — I129 Hypertensive chronic kidney disease with stage 1 through stage 4 chronic kidney disease, or unspecified chronic kidney disease: Secondary | ICD-10-CM | POA: Diagnosis not present

## 2011-06-07 DIAGNOSIS — E1149 Type 2 diabetes mellitus with other diabetic neurological complication: Secondary | ICD-10-CM | POA: Diagnosis not present

## 2011-06-07 DIAGNOSIS — J9600 Acute respiratory failure, unspecified whether with hypoxia or hypercapnia: Secondary | ICD-10-CM | POA: Diagnosis not present

## 2011-06-07 DIAGNOSIS — J45901 Unspecified asthma with (acute) exacerbation: Secondary | ICD-10-CM | POA: Diagnosis not present

## 2011-06-07 DIAGNOSIS — E1142 Type 2 diabetes mellitus with diabetic polyneuropathy: Secondary | ICD-10-CM | POA: Diagnosis not present

## 2011-06-10 DIAGNOSIS — J45901 Unspecified asthma with (acute) exacerbation: Secondary | ICD-10-CM | POA: Diagnosis not present

## 2011-06-10 DIAGNOSIS — J9600 Acute respiratory failure, unspecified whether with hypoxia or hypercapnia: Secondary | ICD-10-CM | POA: Diagnosis not present

## 2011-06-10 DIAGNOSIS — E1149 Type 2 diabetes mellitus with other diabetic neurological complication: Secondary | ICD-10-CM | POA: Diagnosis not present

## 2011-06-10 DIAGNOSIS — I129 Hypertensive chronic kidney disease with stage 1 through stage 4 chronic kidney disease, or unspecified chronic kidney disease: Secondary | ICD-10-CM | POA: Diagnosis not present

## 2011-06-10 DIAGNOSIS — E1142 Type 2 diabetes mellitus with diabetic polyneuropathy: Secondary | ICD-10-CM | POA: Diagnosis not present

## 2011-06-11 DIAGNOSIS — E1149 Type 2 diabetes mellitus with other diabetic neurological complication: Secondary | ICD-10-CM | POA: Diagnosis not present

## 2011-06-11 DIAGNOSIS — J45901 Unspecified asthma with (acute) exacerbation: Secondary | ICD-10-CM | POA: Diagnosis not present

## 2011-06-11 DIAGNOSIS — E1142 Type 2 diabetes mellitus with diabetic polyneuropathy: Secondary | ICD-10-CM | POA: Diagnosis not present

## 2011-06-11 DIAGNOSIS — I129 Hypertensive chronic kidney disease with stage 1 through stage 4 chronic kidney disease, or unspecified chronic kidney disease: Secondary | ICD-10-CM | POA: Diagnosis not present

## 2011-06-11 DIAGNOSIS — J9600 Acute respiratory failure, unspecified whether with hypoxia or hypercapnia: Secondary | ICD-10-CM | POA: Diagnosis not present

## 2011-06-13 DIAGNOSIS — F0280 Dementia in other diseases classified elsewhere without behavioral disturbance: Secondary | ICD-10-CM | POA: Diagnosis not present

## 2011-06-13 DIAGNOSIS — G44219 Episodic tension-type headache, not intractable: Secondary | ICD-10-CM | POA: Diagnosis not present

## 2011-06-13 DIAGNOSIS — G4489 Other headache syndrome: Secondary | ICD-10-CM | POA: Diagnosis not present

## 2011-06-14 DIAGNOSIS — J441 Chronic obstructive pulmonary disease with (acute) exacerbation: Secondary | ICD-10-CM | POA: Diagnosis not present

## 2011-06-14 DIAGNOSIS — I129 Hypertensive chronic kidney disease with stage 1 through stage 4 chronic kidney disease, or unspecified chronic kidney disease: Secondary | ICD-10-CM | POA: Diagnosis not present

## 2011-06-14 DIAGNOSIS — IMO0001 Reserved for inherently not codable concepts without codable children: Secondary | ICD-10-CM | POA: Diagnosis not present

## 2011-06-14 DIAGNOSIS — M2550 Pain in unspecified joint: Secondary | ICD-10-CM | POA: Diagnosis not present

## 2011-06-14 DIAGNOSIS — J45901 Unspecified asthma with (acute) exacerbation: Secondary | ICD-10-CM | POA: Diagnosis not present

## 2011-06-14 DIAGNOSIS — R51 Headache: Secondary | ICD-10-CM | POA: Diagnosis not present

## 2011-06-14 DIAGNOSIS — M1990 Unspecified osteoarthritis, unspecified site: Secondary | ICD-10-CM | POA: Diagnosis not present

## 2011-06-14 DIAGNOSIS — J9600 Acute respiratory failure, unspecified whether with hypoxia or hypercapnia: Secondary | ICD-10-CM | POA: Diagnosis not present

## 2011-06-14 DIAGNOSIS — E1149 Type 2 diabetes mellitus with other diabetic neurological complication: Secondary | ICD-10-CM | POA: Diagnosis not present

## 2011-06-14 DIAGNOSIS — E1142 Type 2 diabetes mellitus with diabetic polyneuropathy: Secondary | ICD-10-CM | POA: Diagnosis not present

## 2011-06-14 DIAGNOSIS — R700 Elevated erythrocyte sedimentation rate: Secondary | ICD-10-CM | POA: Diagnosis not present

## 2011-06-18 DIAGNOSIS — E1142 Type 2 diabetes mellitus with diabetic polyneuropathy: Secondary | ICD-10-CM | POA: Diagnosis not present

## 2011-06-18 DIAGNOSIS — J441 Chronic obstructive pulmonary disease with (acute) exacerbation: Secondary | ICD-10-CM | POA: Diagnosis not present

## 2011-06-18 DIAGNOSIS — E1149 Type 2 diabetes mellitus with other diabetic neurological complication: Secondary | ICD-10-CM | POA: Diagnosis not present

## 2011-06-18 DIAGNOSIS — J45901 Unspecified asthma with (acute) exacerbation: Secondary | ICD-10-CM | POA: Diagnosis not present

## 2011-06-18 DIAGNOSIS — I129 Hypertensive chronic kidney disease with stage 1 through stage 4 chronic kidney disease, or unspecified chronic kidney disease: Secondary | ICD-10-CM | POA: Diagnosis not present

## 2011-06-18 DIAGNOSIS — J9600 Acute respiratory failure, unspecified whether with hypoxia or hypercapnia: Secondary | ICD-10-CM | POA: Diagnosis not present

## 2011-06-20 DIAGNOSIS — E1142 Type 2 diabetes mellitus with diabetic polyneuropathy: Secondary | ICD-10-CM | POA: Diagnosis not present

## 2011-06-20 DIAGNOSIS — I129 Hypertensive chronic kidney disease with stage 1 through stage 4 chronic kidney disease, or unspecified chronic kidney disease: Secondary | ICD-10-CM | POA: Diagnosis not present

## 2011-06-20 DIAGNOSIS — E1149 Type 2 diabetes mellitus with other diabetic neurological complication: Secondary | ICD-10-CM | POA: Diagnosis not present

## 2011-06-20 DIAGNOSIS — J441 Chronic obstructive pulmonary disease with (acute) exacerbation: Secondary | ICD-10-CM | POA: Diagnosis not present

## 2011-06-20 DIAGNOSIS — J9600 Acute respiratory failure, unspecified whether with hypoxia or hypercapnia: Secondary | ICD-10-CM | POA: Diagnosis not present

## 2011-06-21 DIAGNOSIS — D649 Anemia, unspecified: Secondary | ICD-10-CM | POA: Diagnosis not present

## 2011-06-21 DIAGNOSIS — I129 Hypertensive chronic kidney disease with stage 1 through stage 4 chronic kidney disease, or unspecified chronic kidney disease: Secondary | ICD-10-CM | POA: Diagnosis not present

## 2011-06-21 DIAGNOSIS — N2581 Secondary hyperparathyroidism of renal origin: Secondary | ICD-10-CM | POA: Diagnosis not present

## 2011-06-24 ENCOUNTER — Encounter: Payer: Self-pay | Admitting: Pulmonary Disease

## 2011-06-24 ENCOUNTER — Ambulatory Visit (INDEPENDENT_AMBULATORY_CARE_PROVIDER_SITE_OTHER): Payer: Medicare Other | Admitting: Pulmonary Disease

## 2011-06-24 VITALS — BP 128/78 | HR 75 | Temp 98.0°F | Ht 64.5 in | Wt 276.4 lb

## 2011-06-24 DIAGNOSIS — G4733 Obstructive sleep apnea (adult) (pediatric): Secondary | ICD-10-CM | POA: Diagnosis not present

## 2011-06-24 NOTE — Patient Instructions (Signed)
Will arrange for overnight oxygen test  Follow up in 6 months 

## 2011-06-24 NOTE — Assessment & Plan Note (Signed)
She is compliant with therapy and reports benefit.  Will continue current set up.  Will arrange for ONO with CPAP and room air to renew her nocturnal oxygen set up.

## 2011-06-24 NOTE — Assessment & Plan Note (Signed)
Will continue symbicort, singulair, and prn albuterol.  She has difficulty keeping her medication list straight.  I reviewed her inhaler regimen.

## 2011-06-24 NOTE — Progress Notes (Signed)
Chief Complaint  Patient presents with  . Follow-up    pt states breathing has not imporved much since last seen. pt c/o prod cough, sob. [facility] called pt and stated pt needed to requalified for O2 before 07/2011    History of Present Illness: Joann Gutierrez is a 66 y.o. 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  Her breathing has been doing okay.  She gets confused about her inhalers.  She is using her red inhaler twice per day, and blue inhaler once or twice per day.  She is using singulair at night.  She does not have much cough, wheeze, or chest congestion.  She has been using CPAP with oxygen at night.  She feels her CPAP is working well.  She was advised by her DME that she needed to have an appointment to get her nocturnal oxygen renewed.   Past Medical History  Diagnosis Date  . Allergy   . Depression   . Diabetes mellitus 1997    Type II   . GERD (gastroesophageal reflux disease)   . Hypertension   . Hyperlipidemia   . Osteoarthritis   . Osteoporosis   . Hair loss   . Carpal tunnel syndrome on left   . Anemia   . CAD (coronary artery disease)     Mild very minimal coronary disease with 20% obtuse marginal stenosis  . CVA (cerebral infarction)   . Diverticulosis   . Esophageal stricture   . Gastritis   . Adenomatous colon polyp   . Sickle cell trait   . Asthma     pt has cpap w/ her today  . COPD (chronic obstructive pulmonary disease)   . 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 Gastroparesis 08/21/2007  . RENAL INSUFFICIENCY 02/16/2009  . OSTEOARTHRITIS 08/09/2006  . Carpal tunnel syndrome, left   . Morbid obesity   . Hernia, hiatal   . CPAP (continuous positive airway pressure) dependence   . CHF (congestive heart failure)   . Stroke   . Sickle cell anemia   . Seizures     Past Surgical History  Procedure Date    . Abdominal hysterectomy   . Replacement total knee 1998    Left  . Esophagogastroduodenoscopy 06/25/2006  . Breast lumpectomy     benign  . Erd 08/08/2000  . Breast lumpectomy     both breast lumps removed   . Artery biopsy 01/07/2011    Procedure: MINOR BIOPSY TEMPORAL ARTERY;  Surgeon: Haywood Lasso, MD;  Location: [facility];  Service: General;  Laterality: Left;  left temporal artery biopsy    Allergies  Allergen Reactions  . Promethazine Hcl     Physical Exam:  Blood pressure 128/78, pulse 75, temperature 98 F (36.7 C), temperature source Oral, height 5' 4.5" (1.638 m), weight 276 lb 6.4 oz (125.374 kg), SpO2 96.00%.  Body mass index is 46.71 kg/(m^2).  Wt Readings from Last 2 Encounters:  06/24/11 276 lb 6.4 oz (125.374 kg)  05/24/11 281 lb (127.461 kg)    General - obese, dyspneic  HEENT - clear nasal discharge, no oral exudate, no LAN, TM clear b/l Chest - b/l wheeze with scattered rhonchi  Cardiac - s1s2 regular, no murmur  Abd - soft, nontender  Ext - minimal ankle edema  Neuro - normal strength, CN intact  Psych - anxious  CBC    Component Value Date/Time   WBC 11.4* 05/22/2011 0513   RBC 4.32 05/22/2011 0513   HGB 9.8* 05/22/2011 0513   HCT 31.3* 05/22/2011 0513   PLT 260 05/22/2011 0513   MCV 72.5* 05/22/2011 0513   MCH 22.7* 05/22/2011 0513   MCHC 31.3 05/22/2011 0513   RDW 14.2 05/22/2011 0513   LYMPHSABS 1.8 03/01/2010 1150   MONOABS 0.5 03/01/2010 1150   EOSABS 0.2 03/01/2010 1150   BASOSABS 0.0 03/01/2010 1150    BMET    Component Value Date/Time   NA 139 05/24/2011 0515   K 4.2 05/24/2011 0515   CL 104 05/24/2011 0515   CO2 24 05/24/2011 0515   GLUCOSE 88 05/24/2011 0515   BUN 51* 05/24/2011 0515   CREATININE 1.95* 05/24/2011 0515   CALCIUM 8.6 05/24/2011 0515   CALCIUM 9.4 01/02/2009 1400   GFRNONAA 26* 05/24/2011 0515   GFRAA 30* 05/24/2011 0515    Lab Results  Component Value Date   ALT 12 05/18/2011   AST 20 05/18/2011   ALKPHOS 118*  05/18/2011   BILITOT 0.5 05/18/2011   ABG    Component Value Date/Time   PHART 7.386 07/04/2010 1640   PCO2ART 38.7 07/04/2010 1640   PO2ART 78.5* 07/04/2010 1640   HCO3 22.7 07/04/2010 1640   TCO2 20.8 07/04/2010 1640   ACIDBASEDEF 1.6 07/04/2010 1640   O2SAT 96.4 07/04/2010 1640    BNP    Component Value Date/Time   PROBNP 1703.0* 05/18/2011 2227     Dg Chest Port 1 View  05/19/2011  *RADIOLOGY REPORT*  Clinical Data: Pneumonia.  Shortness of breath  PORTABLE CHEST - 1 VIEW  Comparison: 05/18/2011  Findings: Heart size appears mildly enlarged.  No pleural effusion or edema identified.  There is no airspace consolidation identified.  Lung volumes are low and there is slight asymmetric elevation of the right hemidiaphragm.  IMPRESSION:  1.  Mild cardiac enlargement. 2.  Low lung volumes and asymmetric elevation of the right hemidiaphragm.  Original Report Authenticated By: Angelita Ingles, M.D.    Assessment/Plan:  Outpatient Encounter Prescriptions as of 06/24/2011  Medication Sig Dispense Refill  . albuterol (VENTOLIN HFA) 108 (90 BASE) MCG/ACT inhaler Inhale 2 puffs into the lungs every 6 (six) hours as needed for shortness of breath.  1 Inhaler  6  . aspirin 81 MG tablet Take 81 mg by mouth daily.        Marland Kitchen atorvastatin (LIPITOR) 40 MG tablet Take 1 tablet (40 mg total) by mouth daily.  30 tablet  6  . budesonide-formoterol (SYMBICORT) 160-4.5 MCG/ACT inhaler Inhale 2 puffs into the lungs 2 (two) times daily.  1 Inhaler  6  . Calcium Carbonate-Vit D-Min (CALTRATE PLUS PO) Take 600 mg by mouth daily.        Marland Kitchen CARAFATE 1 GM/10ML suspension Take 10 mLs by mouth Daily.      . citalopram (CELEXA) 20 MG tablet Take 20 mg by mouth daily.        . cyclobenzaprine (FLEXERIL) 10 MG tablet Take 1 tablet (10 mg total) by mouth daily.  30 tablet  5  . diclofenac (VOLTAREN) 75 MG EC tablet Take 1 tablet by mouth Daily.      Marland Kitchen dicyclomine (BENTYL) 10 MG capsule Take 1 tablet by mouth Three times a  day before meals.      . donepezil (ARICEPT) 5 MG tablet Take 1 tablet (5 mg total) by mouth daily.  30 tablet  5  .  DULERA 100-5 MCG/ACT AERO Take 2 puffs by mouth Daily.      Marland Kitchen ezetimibe (ZETIA) 10 MG tablet Take 10 mg by mouth daily.        . fluocinonide (LIDEX) 0.05 % gel Apply topically 2 (two) times daily.        . fluticasone (FLONASE) 50 MCG/ACT nasal spray Place 2 sprays into the nose daily.  16 g  11  . furosemide (LASIX) 80 MG tablet Take 0.5 tablets (40 mg total) by mouth 2 (two) times daily.  30 tablet  0  . gabapentin (NEURONTIN) 300 MG capsule Take 1 capsule (300 mg total) by mouth 2 (two) times daily.  60 capsule  5  . HUMALOG 100 UNIT/ML injection Sliding scale      . hydrALAZINE (APRESOLINE) 50 MG tablet Take 1 tablet by mouth Daily.      . insulin glargine (LANTUS) 100 UNIT/ML injection Inject 30 Units into the skin at bedtime.  10 mL  0  . Insulin Syringe-Needle U-100 (B-D INS SYR ULTRAFINE 1CC/31G) 31G X 5/16" 1 ML MISC Use as directecd to inject insulin  100 each  5  . KLOR-CON M20 20 MEQ tablet Take 1 tablet by mouth Daily.      Marland Kitchen losartan (COZAAR) 100 MG tablet Take 1 tablet (100 mg total) by mouth daily.  30 tablet  0  . losartan-hydrochlorothiazide (HYZAAR) 100-25 MG per tablet Take 1 tablet by mouth Daily.      . metFORMIN (GLUCOPHAGE) 500 MG tablet Take 1 tablet by mouth Daily.      . metoCLOPramide (REGLAN) 10 MG tablet Take 10 mg by mouth 4 (four) times daily.        . metolazone (ZAROXOLYN) 2.5 MG tablet Take 1 tablet by mouth Daily.      . Multiple Vitamin (MULTIVITAMIN PO) Take 1 tablet by mouth daily.        Marland Kitchen omeprazole (PRILOSEC) 20 MG capsule Take 40 mg by mouth daily.       . TraMADol HCl 50 MG TBSO Take 1 tablet by mouth 4 (four) times daily as needed.        . diltiazem (CARDIZEM CD) 240 MG 24 hr capsule Take 240 mg by mouth daily.        . raloxifene (EVISTA) 60 MG tablet Take 60 mg by mouth daily.        Marland Kitchen SINGULAIR 10 MG tablet Take 1 tablet by mouth  daily.      Marland Kitchen DISCONTD: amLODipine (NORVASC) 5 MG tablet Take 1 tablet by mouth daily.      Marland Kitchen DISCONTD: amLODipine (NORVASC) 5 MG tablet Take 1 tablet by mouth Daily.      Marland Kitchen DISCONTD: insulin aspart (NOVOLOG) 100 UNIT/ML injection 0-9 Units, Subcutaneous, 3 times daily with meals, CBG(finger stick blood sugar) < 70: eat or drink something sweet and recheck blood sugar. CBG 70 - 120: 0 units, CBG 121 - 150: 1 unit, CBG 151 - 200: 2 units, CBG 201 - 250: 3 units, CBG 251 - 300: 5 units, CBG 301 - 350: 7 units, CBG 351 - 400: 9 units, CBG > 400: call MD.  1 vial  0    Jurrell Royster Pager:  [phone] 06/24/2011, 12:29 PM

## 2011-06-25 DIAGNOSIS — E1149 Type 2 diabetes mellitus with other diabetic neurological complication: Secondary | ICD-10-CM | POA: Diagnosis not present

## 2011-06-25 DIAGNOSIS — J45901 Unspecified asthma with (acute) exacerbation: Secondary | ICD-10-CM | POA: Diagnosis not present

## 2011-06-25 DIAGNOSIS — I129 Hypertensive chronic kidney disease with stage 1 through stage 4 chronic kidney disease, or unspecified chronic kidney disease: Secondary | ICD-10-CM | POA: Diagnosis not present

## 2011-06-25 DIAGNOSIS — E1142 Type 2 diabetes mellitus with diabetic polyneuropathy: Secondary | ICD-10-CM | POA: Diagnosis not present

## 2011-06-25 DIAGNOSIS — J9600 Acute respiratory failure, unspecified whether with hypoxia or hypercapnia: Secondary | ICD-10-CM | POA: Diagnosis not present

## 2011-06-27 DIAGNOSIS — I129 Hypertensive chronic kidney disease with stage 1 through stage 4 chronic kidney disease, or unspecified chronic kidney disease: Secondary | ICD-10-CM | POA: Diagnosis not present

## 2011-06-27 DIAGNOSIS — J9600 Acute respiratory failure, unspecified whether with hypoxia or hypercapnia: Secondary | ICD-10-CM | POA: Diagnosis not present

## 2011-06-27 DIAGNOSIS — E1142 Type 2 diabetes mellitus with diabetic polyneuropathy: Secondary | ICD-10-CM | POA: Diagnosis not present

## 2011-06-27 DIAGNOSIS — J45901 Unspecified asthma with (acute) exacerbation: Secondary | ICD-10-CM | POA: Diagnosis not present

## 2011-06-27 DIAGNOSIS — E1149 Type 2 diabetes mellitus with other diabetic neurological complication: Secondary | ICD-10-CM | POA: Diagnosis not present

## 2011-07-02 DIAGNOSIS — E1149 Type 2 diabetes mellitus with other diabetic neurological complication: Secondary | ICD-10-CM | POA: Diagnosis not present

## 2011-07-02 DIAGNOSIS — J441 Chronic obstructive pulmonary disease with (acute) exacerbation: Secondary | ICD-10-CM | POA: Diagnosis not present

## 2011-07-02 DIAGNOSIS — E1142 Type 2 diabetes mellitus with diabetic polyneuropathy: Secondary | ICD-10-CM | POA: Diagnosis not present

## 2011-07-02 DIAGNOSIS — J9600 Acute respiratory failure, unspecified whether with hypoxia or hypercapnia: Secondary | ICD-10-CM | POA: Diagnosis not present

## 2011-07-02 DIAGNOSIS — I129 Hypertensive chronic kidney disease with stage 1 through stage 4 chronic kidney disease, or unspecified chronic kidney disease: Secondary | ICD-10-CM | POA: Diagnosis not present

## 2011-07-02 DIAGNOSIS — J45901 Unspecified asthma with (acute) exacerbation: Secondary | ICD-10-CM | POA: Diagnosis not present

## 2011-07-03 DIAGNOSIS — D649 Anemia, unspecified: Secondary | ICD-10-CM | POA: Diagnosis not present

## 2011-07-04 DIAGNOSIS — G4733 Obstructive sleep apnea (adult) (pediatric): Secondary | ICD-10-CM | POA: Diagnosis not present

## 2011-07-05 DIAGNOSIS — J441 Chronic obstructive pulmonary disease with (acute) exacerbation: Secondary | ICD-10-CM | POA: Diagnosis not present

## 2011-07-05 DIAGNOSIS — E1142 Type 2 diabetes mellitus with diabetic polyneuropathy: Secondary | ICD-10-CM | POA: Diagnosis not present

## 2011-07-05 DIAGNOSIS — I129 Hypertensive chronic kidney disease with stage 1 through stage 4 chronic kidney disease, or unspecified chronic kidney disease: Secondary | ICD-10-CM | POA: Diagnosis not present

## 2011-07-05 DIAGNOSIS — E1149 Type 2 diabetes mellitus with other diabetic neurological complication: Secondary | ICD-10-CM | POA: Diagnosis not present

## 2011-07-05 DIAGNOSIS — J9600 Acute respiratory failure, unspecified whether with hypoxia or hypercapnia: Secondary | ICD-10-CM | POA: Diagnosis not present

## 2011-07-08 DIAGNOSIS — E1129 Type 2 diabetes mellitus with other diabetic kidney complication: Secondary | ICD-10-CM | POA: Diagnosis not present

## 2011-07-08 DIAGNOSIS — I10 Essential (primary) hypertension: Secondary | ICD-10-CM | POA: Diagnosis not present

## 2011-07-09 ENCOUNTER — Telehealth: Payer: Self-pay | Admitting: Pulmonary Disease

## 2011-07-09 DIAGNOSIS — E1149 Type 2 diabetes mellitus with other diabetic neurological complication: Secondary | ICD-10-CM | POA: Diagnosis not present

## 2011-07-09 DIAGNOSIS — J441 Chronic obstructive pulmonary disease with (acute) exacerbation: Secondary | ICD-10-CM | POA: Diagnosis not present

## 2011-07-09 DIAGNOSIS — J9600 Acute respiratory failure, unspecified whether with hypoxia or hypercapnia: Secondary | ICD-10-CM | POA: Diagnosis not present

## 2011-07-09 DIAGNOSIS — E1142 Type 2 diabetes mellitus with diabetic polyneuropathy: Secondary | ICD-10-CM | POA: Diagnosis not present

## 2011-07-09 DIAGNOSIS — I129 Hypertensive chronic kidney disease with stage 1 through stage 4 chronic kidney disease, or unspecified chronic kidney disease: Secondary | ICD-10-CM | POA: Diagnosis not present

## 2011-07-09 NOTE — Telephone Encounter (Signed)
I spoke with patient about results and she verbalized understanding and had no questions 

## 2011-07-09 NOTE — Telephone Encounter (Signed)
ONO with CPAP and room air 07/04/11 >> Test time 9 hrs 18 min.  Mean SpO2 90%, low SpO2 71%.  Spent 1 hr 24 min with SpO2 < 88%.  Will have my nurse inform patient that oxygen level was low overnight.  She needs to continue using oxygen with CPAP at night.

## 2011-07-10 ENCOUNTER — Other Ambulatory Visit: Payer: Self-pay | Admitting: *Deleted

## 2011-07-10 MED ORDER — FLUTICASONE PROPIONATE 50 MCG/ACT NA SUSP: 2.0000 | Freq: Every day | NASAL | Status: DC

## 2011-07-11 DIAGNOSIS — E1149 Type 2 diabetes mellitus with other diabetic neurological complication: Secondary | ICD-10-CM | POA: Diagnosis not present

## 2011-07-11 DIAGNOSIS — E1142 Type 2 diabetes mellitus with diabetic polyneuropathy: Secondary | ICD-10-CM | POA: Diagnosis not present

## 2011-07-11 DIAGNOSIS — J441 Chronic obstructive pulmonary disease with (acute) exacerbation: Secondary | ICD-10-CM | POA: Diagnosis not present

## 2011-07-11 DIAGNOSIS — J9600 Acute respiratory failure, unspecified whether with hypoxia or hypercapnia: Secondary | ICD-10-CM | POA: Diagnosis not present

## 2011-07-11 DIAGNOSIS — I129 Hypertensive chronic kidney disease with stage 1 through stage 4 chronic kidney disease, or unspecified chronic kidney disease: Secondary | ICD-10-CM | POA: Diagnosis not present

## 2011-07-16 ENCOUNTER — Encounter: Payer: Self-pay | Admitting: Pulmonary Disease

## 2011-07-16 DIAGNOSIS — J441 Chronic obstructive pulmonary disease with (acute) exacerbation: Secondary | ICD-10-CM | POA: Diagnosis not present

## 2011-07-16 DIAGNOSIS — E1149 Type 2 diabetes mellitus with other diabetic neurological complication: Secondary | ICD-10-CM | POA: Diagnosis not present

## 2011-07-16 DIAGNOSIS — J9600 Acute respiratory failure, unspecified whether with hypoxia or hypercapnia: Secondary | ICD-10-CM | POA: Diagnosis not present

## 2011-07-16 DIAGNOSIS — E1142 Type 2 diabetes mellitus with diabetic polyneuropathy: Secondary | ICD-10-CM | POA: Diagnosis not present

## 2011-07-16 DIAGNOSIS — I129 Hypertensive chronic kidney disease with stage 1 through stage 4 chronic kidney disease, or unspecified chronic kidney disease: Secondary | ICD-10-CM | POA: Diagnosis not present

## 2011-07-19 ENCOUNTER — Encounter: Payer: Self-pay | Admitting: Family Medicine

## 2011-07-19 ENCOUNTER — Ambulatory Visit (INDEPENDENT_AMBULATORY_CARE_PROVIDER_SITE_OTHER): Payer: Medicare Other | Admitting: Family Medicine

## 2011-07-19 VITALS — BP 165/77 | HR 110 | Temp 98.8°F | Ht 65.0 in | Wt 270.0 lb

## 2011-07-19 DIAGNOSIS — J069 Acute upper respiratory infection, unspecified: Secondary | ICD-10-CM | POA: Diagnosis not present

## 2011-07-19 DIAGNOSIS — I10 Essential (primary) hypertension: Secondary | ICD-10-CM | POA: Diagnosis not present

## 2011-07-19 DIAGNOSIS — Z7689 Persons encountering health services in other specified circumstances: Secondary | ICD-10-CM

## 2011-07-19 DIAGNOSIS — Z7189 Other specified counseling: Secondary | ICD-10-CM | POA: Diagnosis not present

## 2011-07-19 MED ORDER — LOSARTAN POTASSIUM-HCTZ 100-25 MG PO TABS: 1.0000 | ORAL_TABLET | Freq: Every day | ORAL | Status: DC

## 2011-07-19 MED ORDER — PHENYLEPHRINE-CHLORPHEN-DM 1.75-0.75-2.75 MG/ML PO LIQD: 5.0000 mL | Freq: Four times a day (QID) | ORAL | Status: DC | PRN

## 2011-07-19 NOTE — Progress Notes (Signed)
Subjective:    Patient ID: Joann Gutierrez, female    DOB: 1945/10/24, 66 y.o.   MRN: [id number]  HPI  Ms. Middlesworth presents to establish care.  She is a pleasant 66 year old female with an extensive medical history.  Her main compliant today is that she has had a cough and runny nose for several weeks, and her home health nurse was concerned about her wheezing.   HTN- she has both losartan and losartan-HCTZ on her medication list but is not sure which one she was taking- or if it was both.  She says it was not refilled by her last doctor and she has run out of it.  She denies palpitations, dizziness, chest pain or dyspnea.   Past Medical History  Diagnosis Date  . Allergy   . Depression   . Diabetes mellitus 1997    Type II   . GERD (gastroesophageal reflux disease)   . Hypertension   . Hyperlipidemia   . Osteoarthritis   . Osteoporosis   . Hair loss   . Carpal tunnel syndrome on left   . Anemia   . CAD (coronary artery disease)     Mild very minimal coronary disease with 20% obtuse marginal stenosis  . CVA (cerebral infarction)   . Diverticulosis   . Esophageal stricture   . Gastritis   . Adenomatous colon polyp   . Sickle cell trait   . Asthma     pt has cpap w/ her today  . COPD (chronic obstructive pulmonary disease)   . 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 Gastroparesis 08/21/2007  . RENAL INSUFFICIENCY 02/16/2009  . OSTEOARTHRITIS 08/09/2006  . Carpal tunnel syndrome, left   . Morbid obesity   . Hernia, hiatal   . CPAP (continuous positive airway pressure) dependence   . CHF (congestive heart failure)   . Stroke   . Sickle cell anemia   . Seizures    Family History  Problem Relation Age of Onset  . Colon cancer Brother   . Cancer Brother     Colon Cancer  . Cancer Mother     Liver Cancer  . Diabetes Mother   . Kidney disease Mother   . Heart disease Mother     age 32's  . Heart disease Father 83    MI  .  Heart attack Father     died of MI when pt was 31  . Sickle cell anemia Father   . Diabetes Sister   . Kidney disease Sister   . Heart disease Sister     age 24's  . Allergies Sister   . Diabetes Sister   . Kidney disease Sister   . Heart disease Sister     age 65's   History   Social History  . Marital Status: Widowed    Spouse Name: N/A    Number of Children: 1  . Years of Education: N/A   Occupational History  . Disabled    Social History Main Topics  . Smoking status: Former Smoker -- 0.5 packs/day for 10 years    Quit date: 04/18/1980  . Smokeless tobacco: Never Used  . Alcohol Use: No  . Drug Use: No  . Sexually Active: Not on file   Other Topics Concern  . Not on file   Social History Narrative   Previously worked as a Electrical engineer.Daily Caffeine Use-Coffee and TeaLives with a friend who is her care giver, has home  health nurse come out once a week.  She has family in town- daughter, grand daughter.      Review of Systems Pertinent items in HPI    Objective:   Physical Exam BP 165/77  Pulse 110  Temp 98.8 F (37.1 C) (Oral)  Ht 5\' 5"  (1.651 m)  Wt 270 lb (122.471 kg)  BMI 44.93 kg/m2  SpO2 95% General appearance: alert, cooperative and no distress Ears: normal TM's and external ear canals both ears Nose: clear discharge, turbinates pale Throat: lips, mucosa, and tongue normal; teeth and gums normal Neck: no adenopathy, supple, symmetrical, trachea midline and thyroid not enlarged, symmetric, no tenderness/mass/nodules Lungs: clear to auscultation bilaterally Heart: regular rate and rhythm, S1, S2 normal, no murmur, click, rub or gallop Extremities: extremities normal, atraumatic, no cyanosis or edema Pulses: 2+ and symmetric       Assessment & Plan:

## 2011-07-19 NOTE — Assessment & Plan Note (Signed)
Reviewed supportive treatment, Rx cough medication.  Patient to follow up if symptoms worsen or do not improve.

## 2011-07-19 NOTE — Assessment & Plan Note (Signed)
Taking diltiazem and hydralazine, but off of losartan/HCTZ.  Will re-start this.  Will have her follow up in about one month.

## 2011-07-19 NOTE — Assessment & Plan Note (Signed)
No wheezing on exam today- have asked her to call the office if she develops wheezing/dyspnea/fevers.

## 2011-07-19 NOTE — Patient Instructions (Signed)
It was nice to meet you.  Your blood pressure today was BP: 165/77 mmHg.  Remember your goal blood pressure is about 120/80.  Please be sure to take your medication every day.  I have ordered another blood pressure medication.  Please take it every day.   Please make a lab appointment for first thing in the morning and come and have your blood drawn before you eat or drink anything.  We will check your cholesterol.  I have also sent you a prescription for a cough medication.   Please come back and see me in about one month.

## 2011-07-23 DIAGNOSIS — J9600 Acute respiratory failure, unspecified whether with hypoxia or hypercapnia: Secondary | ICD-10-CM | POA: Diagnosis not present

## 2011-07-23 DIAGNOSIS — I129 Hypertensive chronic kidney disease with stage 1 through stage 4 chronic kidney disease, or unspecified chronic kidney disease: Secondary | ICD-10-CM | POA: Diagnosis not present

## 2011-07-23 DIAGNOSIS — E1149 Type 2 diabetes mellitus with other diabetic neurological complication: Secondary | ICD-10-CM | POA: Diagnosis not present

## 2011-07-23 DIAGNOSIS — E1142 Type 2 diabetes mellitus with diabetic polyneuropathy: Secondary | ICD-10-CM | POA: Diagnosis not present

## 2011-07-23 DIAGNOSIS — J45901 Unspecified asthma with (acute) exacerbation: Secondary | ICD-10-CM | POA: Diagnosis not present

## 2011-07-24 ENCOUNTER — Telehealth: Payer: Self-pay | Admitting: *Deleted

## 2011-07-24 ENCOUNTER — Other Ambulatory Visit: Payer: Medicare Other

## 2011-07-24 DIAGNOSIS — I10 Essential (primary) hypertension: Secondary | ICD-10-CM

## 2011-07-24 DIAGNOSIS — E7800 Pure hypercholesterolemia, unspecified: Secondary | ICD-10-CM

## 2011-07-24 NOTE — Telephone Encounter (Signed)
Candie Mile, RN with [facility] calling due to patient having 8 lb weight gain.  Patient is being "telemonitored" for DM, BP, and CHFand calls in with daily BP, CBG, and weight.  Weight on 07/21/11--268.8 lb and 07/23/11--276.8 lb.  Patient is taking Lasix 80mg  BID.  Will route note to Dr. [name] for advice and call back.  Nolene Ebbs, RN

## 2011-07-24 NOTE — Telephone Encounter (Signed)
Spoke with [place] with [facility].  Says Ms. Kress's weigh tis up 8 lbs in one week.  Her BP has also been up and down.  She is taking her lasix 80 po bid.  Her pulse ox was 95% on room air.    Patient has CKD III, had labs drawn today, should know what her creatinine is by tomorrow.  Advised to take 80 mg of lasix TID for three days.  Let Jenny Reichmann know I would follow up with her creatinine.  Asked her to tell the patient to schedule an appointment with me next week.

## 2011-07-24 NOTE — Progress Notes (Signed)
BMP AND FLP DONE TODAY PER DR. Powhatan

## 2011-07-25 DIAGNOSIS — I10 Essential (primary) hypertension: Secondary | ICD-10-CM | POA: Diagnosis not present

## 2011-07-25 DIAGNOSIS — Z9181 History of falling: Secondary | ICD-10-CM | POA: Diagnosis not present

## 2011-07-25 DIAGNOSIS — F0280 Dementia in other diseases classified elsewhere without behavioral disturbance: Secondary | ICD-10-CM | POA: Diagnosis not present

## 2011-07-25 DIAGNOSIS — E1149 Type 2 diabetes mellitus with other diabetic neurological complication: Secondary | ICD-10-CM | POA: Diagnosis not present

## 2011-07-25 DIAGNOSIS — E669 Obesity, unspecified: Secondary | ICD-10-CM | POA: Diagnosis not present

## 2011-07-25 DIAGNOSIS — I2510 Atherosclerotic heart disease of native coronary artery without angina pectoris: Secondary | ICD-10-CM | POA: Diagnosis not present

## 2011-07-25 DIAGNOSIS — I509 Heart failure, unspecified: Secondary | ICD-10-CM | POA: Diagnosis not present

## 2011-07-25 DIAGNOSIS — E1142 Type 2 diabetes mellitus with diabetic polyneuropathy: Secondary | ICD-10-CM | POA: Diagnosis not present

## 2011-07-25 DIAGNOSIS — I129 Hypertensive chronic kidney disease with stage 1 through stage 4 chronic kidney disease, or unspecified chronic kidney disease: Secondary | ICD-10-CM | POA: Diagnosis not present

## 2011-07-25 DIAGNOSIS — Z8673 Personal history of transient ischemic attack (TIA), and cerebral infarction without residual deficits: Secondary | ICD-10-CM | POA: Diagnosis not present

## 2011-07-25 DIAGNOSIS — J441 Chronic obstructive pulmonary disease with (acute) exacerbation: Secondary | ICD-10-CM | POA: Diagnosis not present

## 2011-07-25 DIAGNOSIS — J45901 Unspecified asthma with (acute) exacerbation: Secondary | ICD-10-CM | POA: Diagnosis not present

## 2011-07-25 DIAGNOSIS — J9620 Acute and chronic respiratory failure, unspecified whether with hypoxia or hypercapnia: Secondary | ICD-10-CM | POA: Diagnosis not present

## 2011-07-25 DIAGNOSIS — Z9981 Dependence on supplemental oxygen: Secondary | ICD-10-CM | POA: Diagnosis not present

## 2011-07-25 DIAGNOSIS — Z794 Long term (current) use of insulin: Secondary | ICD-10-CM | POA: Diagnosis not present

## 2011-07-25 LAB — BASIC METABOLIC PANEL
BUN: 30 mg/dL — ABNORMAL HIGH (ref 6–23)
CO2: 32 mEq/L (ref 19–32)
Chloride: 103 mEq/L (ref 96–112)
Creat: 1.36 mg/dL — ABNORMAL HIGH (ref 0.50–1.10)

## 2011-07-25 LAB — LIPID PANEL
HDL: 45 mg/dL (ref >39)
LDL Cholesterol: 101 mg/dL — ABNORMAL HIGH (ref 0–99)

## 2011-07-30 DIAGNOSIS — I509 Heart failure, unspecified: Secondary | ICD-10-CM | POA: Diagnosis not present

## 2011-07-30 DIAGNOSIS — I129 Hypertensive chronic kidney disease with stage 1 through stage 4 chronic kidney disease, or unspecified chronic kidney disease: Secondary | ICD-10-CM | POA: Diagnosis not present

## 2011-07-30 DIAGNOSIS — I10 Essential (primary) hypertension: Secondary | ICD-10-CM | POA: Diagnosis not present

## 2011-07-30 DIAGNOSIS — J45901 Unspecified asthma with (acute) exacerbation: Secondary | ICD-10-CM | POA: Diagnosis not present

## 2011-07-30 DIAGNOSIS — I2510 Atherosclerotic heart disease of native coronary artery without angina pectoris: Secondary | ICD-10-CM | POA: Diagnosis not present

## 2011-08-05 DIAGNOSIS — R413 Other amnesia: Secondary | ICD-10-CM | POA: Diagnosis not present

## 2011-08-05 DIAGNOSIS — Z760 Encounter for issue of repeat prescription: Secondary | ICD-10-CM | POA: Diagnosis not present

## 2011-08-05 DIAGNOSIS — E1129 Type 2 diabetes mellitus with other diabetic kidney complication: Secondary | ICD-10-CM | POA: Diagnosis not present

## 2011-08-06 DIAGNOSIS — I2510 Atherosclerotic heart disease of native coronary artery without angina pectoris: Secondary | ICD-10-CM | POA: Diagnosis not present

## 2011-08-06 DIAGNOSIS — I10 Essential (primary) hypertension: Secondary | ICD-10-CM | POA: Diagnosis not present

## 2011-08-06 DIAGNOSIS — J441 Chronic obstructive pulmonary disease with (acute) exacerbation: Secondary | ICD-10-CM | POA: Diagnosis not present

## 2011-08-06 DIAGNOSIS — I509 Heart failure, unspecified: Secondary | ICD-10-CM | POA: Diagnosis not present

## 2011-08-06 DIAGNOSIS — I129 Hypertensive chronic kidney disease with stage 1 through stage 4 chronic kidney disease, or unspecified chronic kidney disease: Secondary | ICD-10-CM | POA: Diagnosis not present

## 2011-08-14 DIAGNOSIS — I509 Heart failure, unspecified: Secondary | ICD-10-CM | POA: Diagnosis not present

## 2011-08-14 DIAGNOSIS — I10 Essential (primary) hypertension: Secondary | ICD-10-CM | POA: Diagnosis not present

## 2011-08-14 DIAGNOSIS — I129 Hypertensive chronic kidney disease with stage 1 through stage 4 chronic kidney disease, or unspecified chronic kidney disease: Secondary | ICD-10-CM | POA: Diagnosis not present

## 2011-08-14 DIAGNOSIS — J45901 Unspecified asthma with (acute) exacerbation: Secondary | ICD-10-CM | POA: Diagnosis not present

## 2011-08-14 DIAGNOSIS — I2510 Atherosclerotic heart disease of native coronary artery without angina pectoris: Secondary | ICD-10-CM | POA: Diagnosis not present

## 2011-08-19 DIAGNOSIS — N2581 Secondary hyperparathyroidism of renal origin: Secondary | ICD-10-CM | POA: Diagnosis not present

## 2011-08-19 DIAGNOSIS — F0280 Dementia in other diseases classified elsewhere without behavioral disturbance: Secondary | ICD-10-CM | POA: Diagnosis not present

## 2011-08-19 DIAGNOSIS — D649 Anemia, unspecified: Secondary | ICD-10-CM | POA: Diagnosis not present

## 2011-08-19 DIAGNOSIS — R51 Headache: Secondary | ICD-10-CM | POA: Diagnosis not present

## 2011-08-19 DIAGNOSIS — G43019 Migraine without aura, intractable, without status migrainosus: Secondary | ICD-10-CM | POA: Diagnosis not present

## 2011-08-19 DIAGNOSIS — M542 Cervicalgia: Secondary | ICD-10-CM | POA: Diagnosis not present

## 2011-08-19 DIAGNOSIS — G309 Alzheimer's disease, unspecified: Secondary | ICD-10-CM | POA: Diagnosis not present

## 2011-08-22 DIAGNOSIS — I509 Heart failure, unspecified: Secondary | ICD-10-CM | POA: Diagnosis not present

## 2011-08-22 DIAGNOSIS — I129 Hypertensive chronic kidney disease with stage 1 through stage 4 chronic kidney disease, or unspecified chronic kidney disease: Secondary | ICD-10-CM | POA: Diagnosis not present

## 2011-08-22 DIAGNOSIS — I2510 Atherosclerotic heart disease of native coronary artery without angina pectoris: Secondary | ICD-10-CM | POA: Diagnosis not present

## 2011-08-22 DIAGNOSIS — I10 Essential (primary) hypertension: Secondary | ICD-10-CM | POA: Diagnosis not present

## 2011-08-22 DIAGNOSIS — J441 Chronic obstructive pulmonary disease with (acute) exacerbation: Secondary | ICD-10-CM | POA: Diagnosis not present

## 2011-08-27 DIAGNOSIS — L608 Other nail disorders: Secondary | ICD-10-CM | POA: Diagnosis not present

## 2011-08-27 DIAGNOSIS — M79609 Pain in unspecified limb: Secondary | ICD-10-CM | POA: Diagnosis not present

## 2011-08-27 DIAGNOSIS — I10 Essential (primary) hypertension: Secondary | ICD-10-CM | POA: Diagnosis not present

## 2011-08-27 DIAGNOSIS — J441 Chronic obstructive pulmonary disease with (acute) exacerbation: Secondary | ICD-10-CM | POA: Diagnosis not present

## 2011-08-27 DIAGNOSIS — I509 Heart failure, unspecified: Secondary | ICD-10-CM | POA: Diagnosis not present

## 2011-08-27 DIAGNOSIS — I129 Hypertensive chronic kidney disease with stage 1 through stage 4 chronic kidney disease, or unspecified chronic kidney disease: Secondary | ICD-10-CM | POA: Diagnosis not present

## 2011-08-27 DIAGNOSIS — E109 Type 1 diabetes mellitus without complications: Secondary | ICD-10-CM | POA: Diagnosis not present

## 2011-08-27 DIAGNOSIS — I2510 Atherosclerotic heart disease of native coronary artery without angina pectoris: Secondary | ICD-10-CM | POA: Diagnosis not present

## 2011-08-28 DIAGNOSIS — D649 Anemia, unspecified: Secondary | ICD-10-CM | POA: Diagnosis not present

## 2011-08-28 DIAGNOSIS — I129 Hypertensive chronic kidney disease with stage 1 through stage 4 chronic kidney disease, or unspecified chronic kidney disease: Secondary | ICD-10-CM | POA: Diagnosis not present

## 2011-08-28 DIAGNOSIS — N2581 Secondary hyperparathyroidism of renal origin: Secondary | ICD-10-CM | POA: Diagnosis not present

## 2011-08-28 DIAGNOSIS — D509 Iron deficiency anemia, unspecified: Secondary | ICD-10-CM | POA: Diagnosis not present

## 2011-09-03 DIAGNOSIS — I2510 Atherosclerotic heart disease of native coronary artery without angina pectoris: Secondary | ICD-10-CM | POA: Diagnosis not present

## 2011-09-03 DIAGNOSIS — J441 Chronic obstructive pulmonary disease with (acute) exacerbation: Secondary | ICD-10-CM | POA: Diagnosis not present

## 2011-09-03 DIAGNOSIS — I129 Hypertensive chronic kidney disease with stage 1 through stage 4 chronic kidney disease, or unspecified chronic kidney disease: Secondary | ICD-10-CM | POA: Diagnosis not present

## 2011-09-03 DIAGNOSIS — I10 Essential (primary) hypertension: Secondary | ICD-10-CM | POA: Diagnosis not present

## 2011-09-03 DIAGNOSIS — I509 Heart failure, unspecified: Secondary | ICD-10-CM | POA: Diagnosis not present

## 2011-09-04 ENCOUNTER — Encounter: Payer: Self-pay | Admitting: Family Medicine

## 2011-09-04 ENCOUNTER — Other Ambulatory Visit: Payer: Self-pay | Admitting: *Deleted

## 2011-09-04 ENCOUNTER — Ambulatory Visit (INDEPENDENT_AMBULATORY_CARE_PROVIDER_SITE_OTHER): Payer: Medicare Other | Admitting: Family Medicine

## 2011-09-04 VITALS — BP 165/82 | HR 97 | Temp 98.7°F | Ht 65.0 in | Wt 276.0 lb

## 2011-09-04 DIAGNOSIS — E119 Type 2 diabetes mellitus without complications: Secondary | ICD-10-CM

## 2011-09-04 DIAGNOSIS — E7800 Pure hypercholesterolemia, unspecified: Secondary | ICD-10-CM

## 2011-09-04 DIAGNOSIS — I10 Essential (primary) hypertension: Secondary | ICD-10-CM | POA: Diagnosis not present

## 2011-09-04 LAB — POCT GLYCOSYLATED HEMOGLOBIN (HGB A1C): Hemoglobin A1C: 8.5

## 2011-09-04 MED ORDER — METOPROLOL TARTRATE 25 MG PO TABS: 25.0000 mg | ORAL_TABLET | Freq: Two times a day (BID) | ORAL | Status: AC

## 2011-09-04 MED ORDER — METOPROLOL TARTRATE 25 MG PO TABS: 25.0000 mg | ORAL_TABLET | Freq: Two times a day (BID) | ORAL | Status: DC

## 2011-09-04 NOTE — Patient Instructions (Signed)
It was good to see you today.  Your Hemoglobin A1C is  Lab Results  Component Value Date   HGBA1C 8.5 09/04/2011  .  Remember your goal for A1C is less than 7.  Your goal for fasting morning blood sugar is 80-120.  You have improved significantly- great job!   Your blood pressure today was BP: 165/82 mmHg.  Remember your goal blood pressure is about 130/80.  Please be sure to take your medication every day.  Please start taking lopressor again for your blood pressure.   Please write down your blood pressure and your blood sugar every day, and bring those records to your next appointment.  Also, be sure to bring all your medications to your next visit.  I want to see you in 3-4 weeks to check on your blood pressure.

## 2011-09-05 NOTE — Assessment & Plan Note (Signed)
Improved control, A1C down to 8.5.  She is having some low blood sugars, so at this point I have asked her to watch her diet and minimize carbohydrates, will not change current insulin regimen.

## 2011-09-05 NOTE — Assessment & Plan Note (Signed)
Well controlled on current regimen have asked patient to be sure to bring all her medications next visit.

## 2011-09-05 NOTE — Assessment & Plan Note (Signed)
Elevated- patient is unsure which medications she is taking, except that she is not taking lopressor right now.  Will re-start this medication, have her keep BP log, and have stressed importance of bringing all her meds next visit, follow up in 1 month.

## 2011-09-05 NOTE — Progress Notes (Signed)
  Subjective:    Patient ID: Joann Gutierrez, female    DOB: 07-30-1945, 66 y.o.   MRN: [id number]  HPI  Ms. Mcshea comes in for follow up.  She says she is doing fairly well.   BP-she forgot to bring medications with her today, and does not know the names of all her medications.  She has a home health nurse who has been checking it, and she thinks it has ranged from 140/75-170-85.  She denies any chest pain or palpitations.  She does know that she used to take lopressor, and stopped because no one refilled it.  She does not remember what dose.   DM- taking 30 units of lantus daily, and a sliding scale humalog with meals.  She says she has had a fasting sugar as low as 60, but as high as 180.  She has not had any polyuria/polydypsia.   HLD- had lipid panel drawn since last visit, I beieve she is  on ezetia and atorvastatin, recent lipid panel showed good control.      Review of Systems Pertinent items in HPI    Objective:   Physical Exam BP 165/82  Pulse 97  Temp 98.7 F (37.1 C) (Oral)  Ht 5\' 5"  (1.651 m)  Wt 276 lb (125.193 kg)  BMI 45.93 kg/m2 General appearance: alert, cooperative and no distress Neck: no JVD and supple, symmetrical, trachea midline Lungs: clear to auscultation bilaterally Heart: regular rate and rhythm, S1, S2 normal, no murmur, click, rub or gallop Extremities: stable 1+ edema Pulses: 2+ and symmetric       Assessment & Plan:

## 2011-09-27 IMAGING — CR DG CHEST 1V PORT
1 series · 1 of 1 positions shown · non-contrast
Comparison: Chest radiograph 03/01/2010

CLINICAL DATA: Asthma

PORTABLE CHEST - 1 VIEW

[AP]
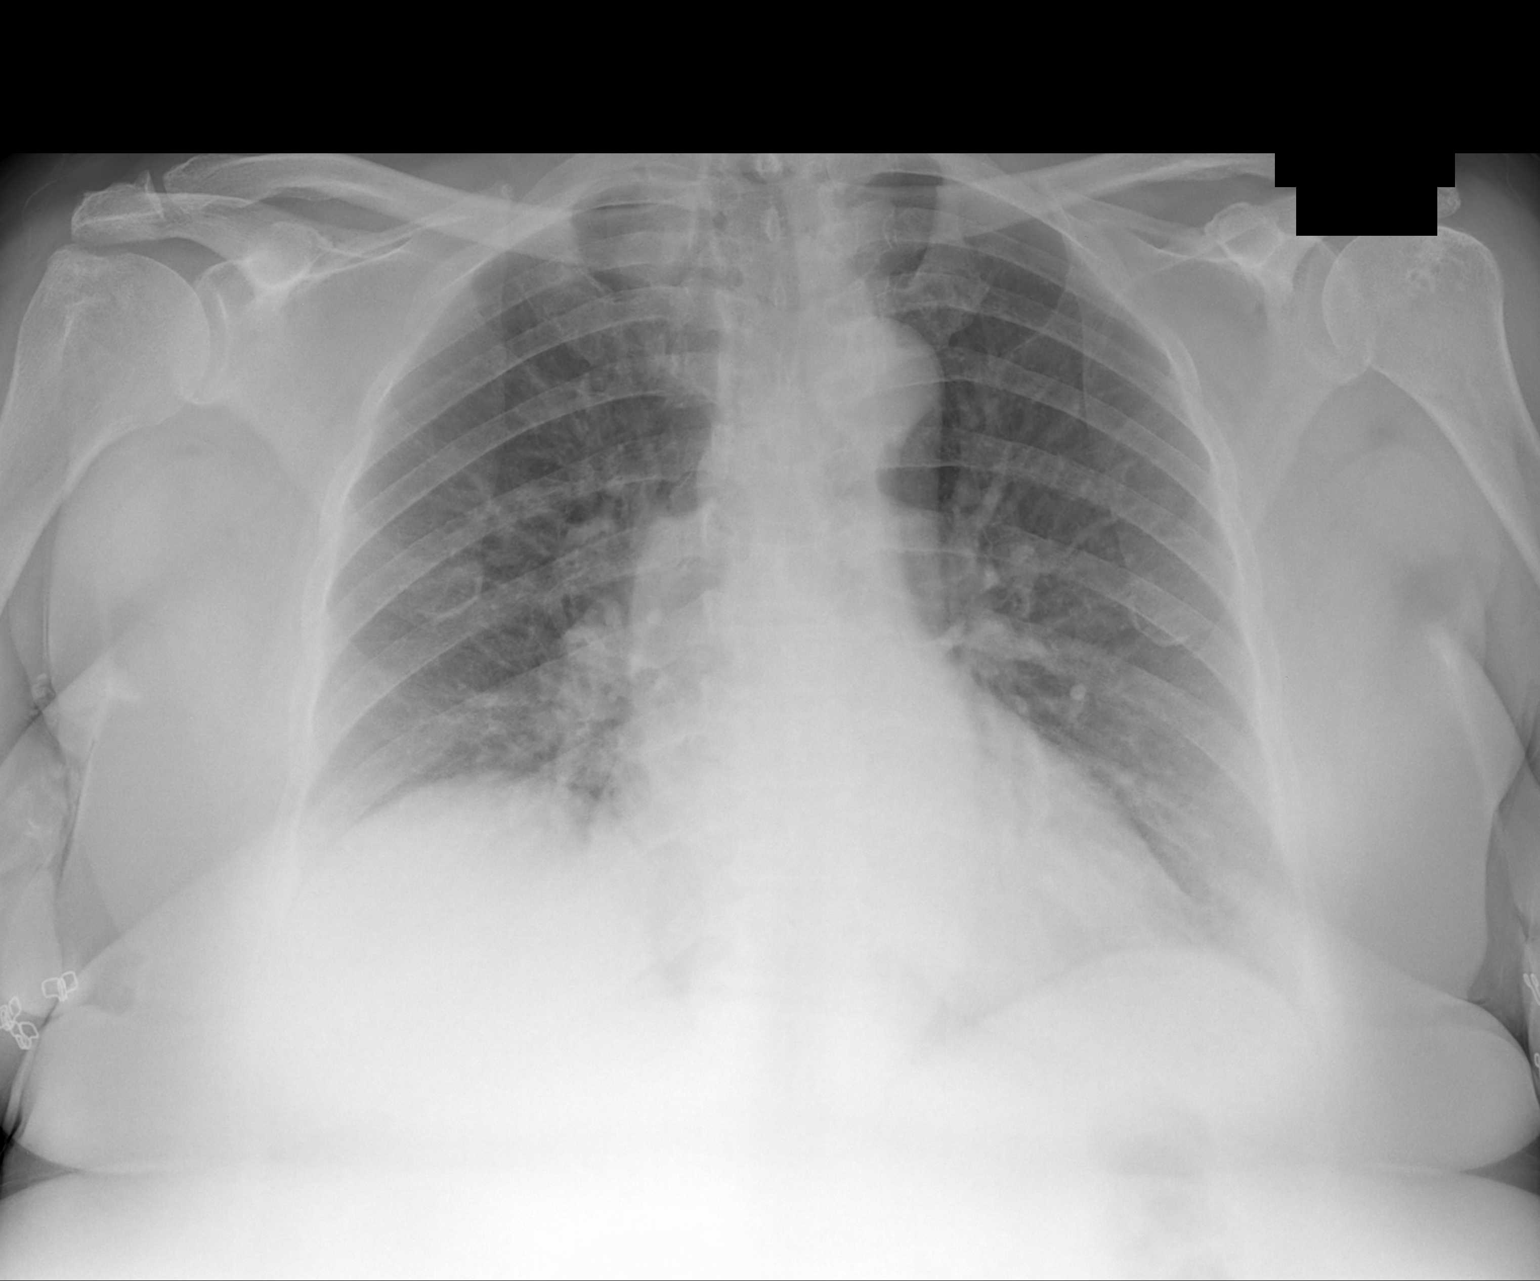

[1 of 1 positions shown; findings below may reference images not displayed]

FINDINGS: Normal mediastinum and heart silhouette.  Costophrenic
angles are clear.  There are low lung volumes with mild basilar
atelectasis.  Mild central venous congestion.
IMPRESSION: Central venous congestion and low lung volumes.  No infiltrate.

## 2011-09-28 IMAGING — CR DG CHEST 1V PORT
1 series · 1 of 1 positions shown · non-contrast
Comparison: Chest x-ray 07/03/2010.

CLINICAL DATA: Asthma, shortness of breath.

PORTABLE CHEST - 1 VIEW

[AP]
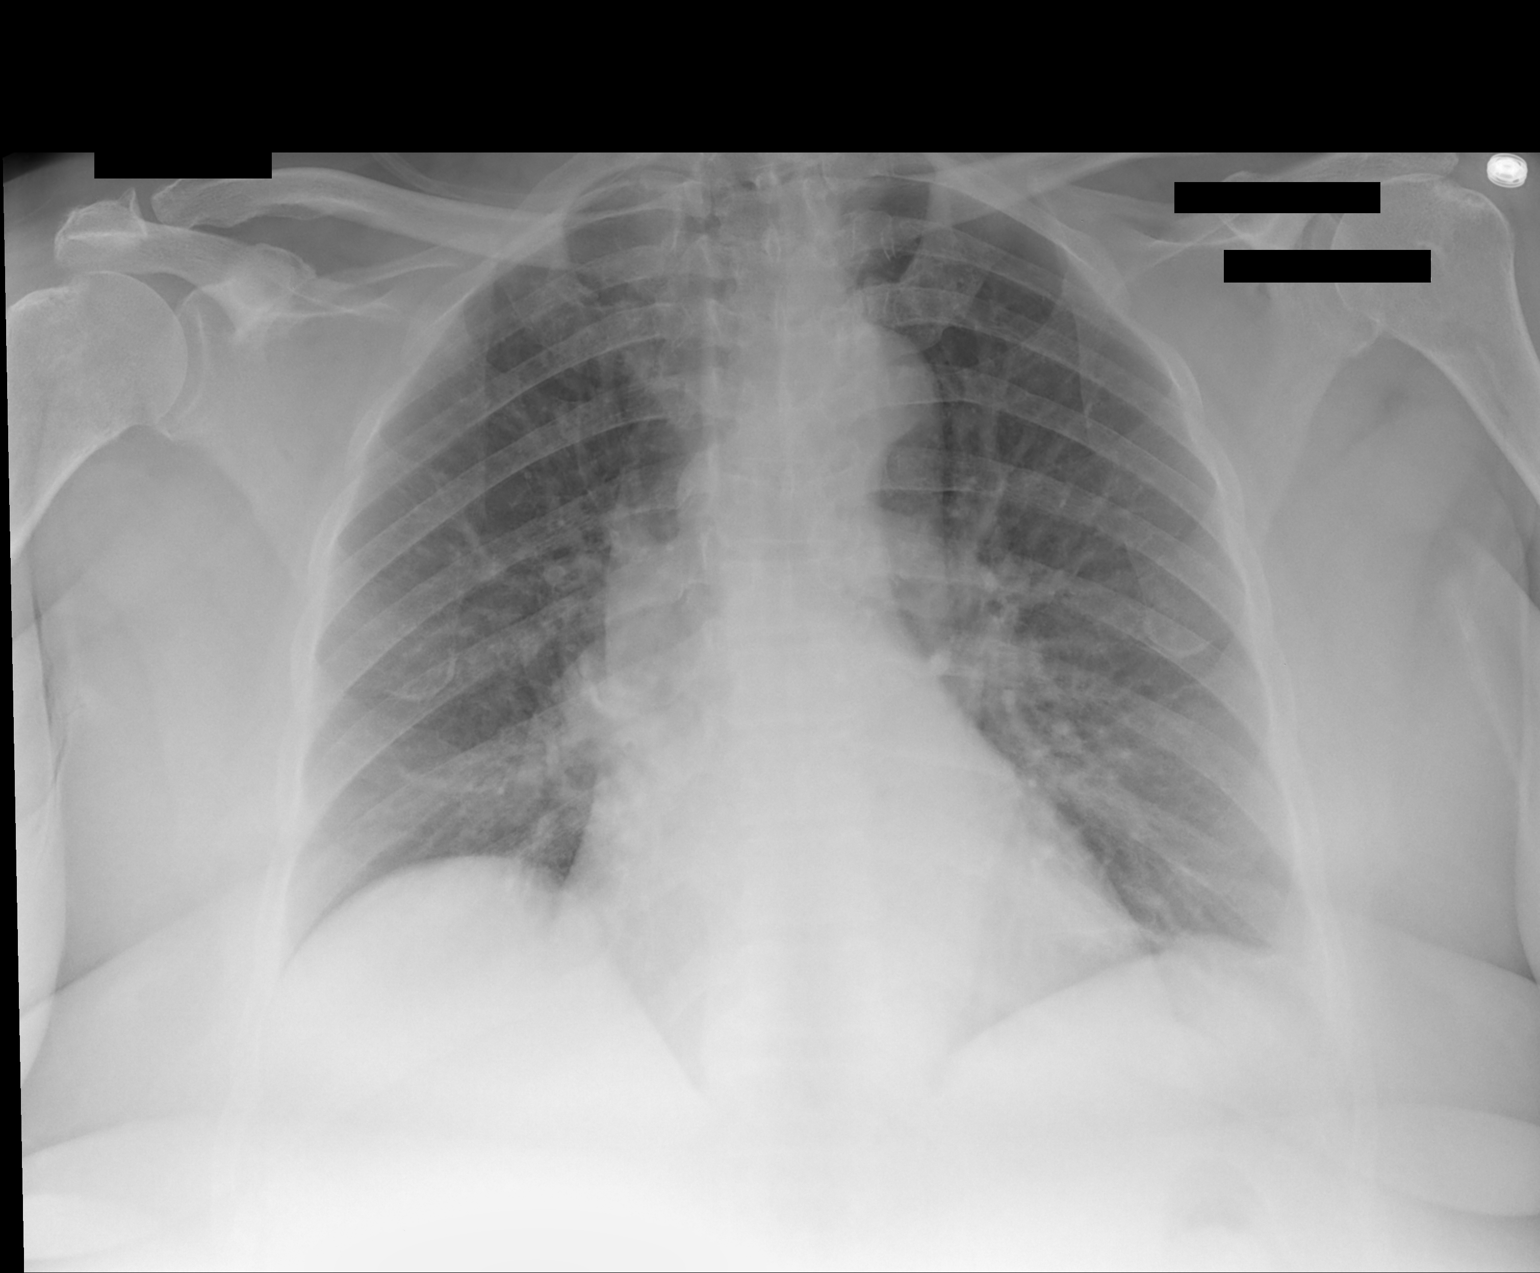

[1 of 1 positions shown; findings below may reference images not displayed]

FINDINGS: The cardiac silhouette, mediastinal and hilar contours
are within normal limits and stable.  Mild tortuosity of the
thoracic aorta.  There are chronic bronchitic type lung changes,
likely related to smoking or reactive airways disease.  No definite
acute overlying pulmonary process.
IMPRESSION: Chronic bronchitic type lung changes without acute infiltrate.

## 2011-10-04 ENCOUNTER — Encounter: Payer: Self-pay | Admitting: Family Medicine

## 2011-10-04 ENCOUNTER — Ambulatory Visit (INDEPENDENT_AMBULATORY_CARE_PROVIDER_SITE_OTHER): Payer: Medicare Other | Admitting: Family Medicine

## 2011-10-04 VITALS — BP 132/70 | HR 89 | Temp 98.5°F | Ht 65.0 in | Wt 275.0 lb

## 2011-10-04 DIAGNOSIS — I10 Essential (primary) hypertension: Secondary | ICD-10-CM

## 2011-10-04 DIAGNOSIS — E119 Type 2 diabetes mellitus without complications: Secondary | ICD-10-CM

## 2011-10-04 NOTE — Assessment & Plan Note (Signed)
Improved control with re-starting metoprolol.  Will continue current regimen.

## 2011-10-04 NOTE — Assessment & Plan Note (Signed)
Improved symptomatically from a breathing perspective.  Encouraged her to be more active as tolerated.

## 2011-10-04 NOTE — Assessment & Plan Note (Signed)
Continues to be well controlled, encouraged tight blood sugar control.

## 2011-10-04 NOTE — Patient Instructions (Signed)
I am glad you are doing better.  Your blood pressure today was BP: 132/70 mmHg.  Remember your goal blood pressure is about 130/80.  Please be sure to take your medication every day.  Great job.   For your diabetes- please take your medications every day.  If your blood sugar first thing in the morning is frequently >150, please call the office to let me know.   Please call the eye doctor to make an appointment for a check up.   Also, you are due for your screening Mammogram- please call see the attached sheet.

## 2011-10-04 NOTE — Progress Notes (Signed)
Patient ID: Joann Gutierrez, female   DOB: [birth year], 66 y.o.   MRN: [id number]  Subjective:    HPI:   Patient comes in for follow up of chronic medical problems  DM: Patient is taking lantus and SSI novolog.  Patient is checking blood sugars.  Fasting sugars range from 100 to 150.  No hyper or hypoglycemic episodes, no polyuria or polydypisa  HTN: Taking metoprolol, cardizem, hyzaar,hydralazine  without difficulty.  Denies chest pain, dizziness, palpitations, LE edema.  Patient has not checked blood pressures.  Lung disease- taking symbicort and albuterol PRN.  Had good report last visit from Dr. Sylvan Cheese.  Her breathing is feeling better, but says it still limits her activity some days.   Past Medical History  Diagnosis Date  . Allergy   . Depression   . Diabetes mellitus 1997    Type II   . GERD (gastroesophageal reflux disease)   . Hypertension   . Hyperlipidemia   . Osteoarthritis   . Osteoporosis   . Hair loss   . Carpal tunnel syndrome on left   . Anemia   . CAD (coronary artery disease)     Mild very minimal coronary disease with 20% obtuse marginal stenosis  . CVA (cerebral infarction)   . Diverticulosis   . Esophageal stricture   . Gastritis   . Adenomatous colon polyp   . Sickle cell trait   . Asthma     pt has cpap w/ her today  . COPD (chronic obstructive pulmonary disease)   . 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 Gastroparesis 08/21/2007  . RENAL INSUFFICIENCY 02/16/2009  . OSTEOARTHRITIS 08/09/2006  . Carpal tunnel syndrome, left   . Morbid obesity   . Hernia, hiatal   . CPAP (continuous positive airway pressure) dependence   . CHF (congestive heart failure)   . Stroke   . Sickle cell anemia   . Seizures    Family History  Problem Relation Age of Onset  . Colon cancer Brother   . Cancer Brother     Colon Cancer  . Cancer Mother     Liver Cancer  . Diabetes Mother   . Kidney disease Mother   . Heart  disease Mother     age 10's  . Heart disease Father 84    MI  . Heart attack Father     died of MI when pt was 67  . Sickle cell anemia Father   . Diabetes Sister   . Kidney disease Sister   . Heart disease Sister     age 28's  . Allergies Sister   . Diabetes Sister   . Kidney disease Sister   . Heart disease Sister     age 79's   History  Substance Use Topics  . Smoking status: Former Smoker -- 0.5 packs/day for 10 years    Quit date: 04/18/1980  . Smokeless tobacco: Never Used  . Alcohol Use: No    ROS:  Negative except stated in HPI    Objective:  Physical Exam:  BP 132/70  Pulse 89  Temp 98.5 F (36.9 C) (Oral)  Ht 5\' 5"  (1.651 m)  Wt 275 lb (124.739 kg)  BMI 45.76 kg/m2 General appearance: alert, cooperative and no distress Neck: no adenopathy, supple, symmetrical, trachea midline and thyroid not enlarged, symmetric, no tenderness/mass/nodules Lungs: clear to auscultation bilaterally, diminished breath sounds in bases Heart: regular rate and rhythm, S1, S2 normal, no murmur, click, rub or  gallop Extremities: extremities normal, atraumatic, no cyanosis, but with stable 1+ pitting edema.  Pulses: 2+ and symmetric       Assessment & Plan:

## 2011-10-16 DIAGNOSIS — G4489 Other headache syndrome: Secondary | ICD-10-CM | POA: Diagnosis not present

## 2011-10-16 DIAGNOSIS — F0280 Dementia in other diseases classified elsewhere without behavioral disturbance: Secondary | ICD-10-CM | POA: Diagnosis not present

## 2011-10-16 DIAGNOSIS — M542 Cervicalgia: Secondary | ICD-10-CM | POA: Diagnosis not present

## 2011-10-16 DIAGNOSIS — G43009 Migraine without aura, not intractable, without status migrainosus: Secondary | ICD-10-CM | POA: Diagnosis not present

## 2011-10-16 DIAGNOSIS — G309 Alzheimer's disease, unspecified: Secondary | ICD-10-CM | POA: Diagnosis not present

## 2011-10-23 ENCOUNTER — Encounter: Payer: Self-pay | Admitting: Adult Health

## 2011-10-23 ENCOUNTER — Ambulatory Visit (INDEPENDENT_AMBULATORY_CARE_PROVIDER_SITE_OTHER): Payer: Medicare Other | Admitting: Adult Health

## 2011-10-23 VITALS — BP 136/76 | HR 74 | Temp 97.9°F | Ht 64.5 in | Wt 283.4 lb

## 2011-10-23 DIAGNOSIS — J449 Chronic obstructive pulmonary disease, unspecified: Secondary | ICD-10-CM

## 2011-10-23 MED ORDER — AMOXICILLIN-POT CLAVULANATE 875-125 MG PO TABS: 1.0000 | ORAL_TABLET | Freq: Two times a day (BID) | ORAL | Status: AC

## 2011-10-23 NOTE — Patient Instructions (Addendum)
Augmentin 875mg  Twice daily  For 7 days  Mucinex DM Twice daily  As needed  Cough/congesiton  Fluids and rest  Please contact office for sooner follow up if symptoms do not improve or worsen or seek emergency care  follow up Dr. Halford Chessman  In 1-2 months and As needed

## 2011-10-23 NOTE — Assessment & Plan Note (Addendum)
Mild flare with bronchitis  xopenex neb in office   Plan Augmentin 875mg  Twice daily  For 7 days  Mucinex DM Twice daily  As needed  Cough/congesiton  Fluids and rest  Please contact office for sooner follow up if symptoms do not improve or worsen or seek emergency care  follow up Dr. Halford Chessman  In 1-2 months and As needed

## 2011-10-23 NOTE — Progress Notes (Signed)
  Subjective:    Patient ID: Joann Gutierrez, female    DOB: 18-Jan-1946, 66 y.o.   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   No fever or hemotpysis  No otc used.  No increased SABA use.  Cough is not getting better, getting up thick yellow mucus    Review of Systems  Constitutional:   No  weight loss, night sweats,  Fevers, chills, + fatigue, or  lassitude.  HEENT:   No headaches,  Difficulty swallowing,  Tooth/dental problems, or  Sore throat,                No sneezing, itching, ear ache,  +nasal congestion, post nasal drip,   CV:  No chest pain,  Orthopnea, PND, swelling in lower extremities, anasarca, dizziness, palpitations, syncope.   GI  No heartburn, indigestion, abdominal pain, nausea, vomiting, diarrhea, change in bowel habits, loss of appetite, bloody stools.   Resp:       No coughing up of blood.     No chest wall deformity  Skin: no rash or lesions.  GU: no dysuria, change in color of urine, no urgency or frequency.  No flank pain, no hematuria   MS:  No joint pain or swelling.  No decreased range of motion.  No back pain.  Psych:  No change in mood or affect. No depression or anxiety.  No memory loss.         Objective:   Physical Exam  GEN: A/Ox3; pleasant , NAD, obese   HEENT:  [place]/AT,  EACs-clear, TMs-wnl, NOSE-clear, THROAT-clear, no lesions, no postnasal drip or exudate noted.   NECK:  Supple w/ fair ROM; no JVD; normal carotid impulses w/o bruits; no thyromegaly or nodules palpated; no lymphadenopathy.  RESP  Coarse BS  w/o, wheezes/ rales/ or rhonchi.no accessory muscle use, no dullness to percussion  CARD:  RRR, no m/r/g  + tr  peripheral edema, pulses intact, no cyanosis or  clubbing.  GI:   Soft & nt; nml bowel sounds; no organomegaly or masses detected.  Musco: Warm bil, no deformities or joint swelling noted.   Neuro: alert, no focal deficits noted.    Skin: Warm, no lesions or rashes         Assessment & Plan:

## 2011-10-23 NOTE — Progress Notes (Signed)
Reviewed and agree with assessment/plan. 

## 2011-11-01 ENCOUNTER — Encounter: Payer: Self-pay | Admitting: Cardiology

## 2011-11-01 ENCOUNTER — Ambulatory Visit (INDEPENDENT_AMBULATORY_CARE_PROVIDER_SITE_OTHER): Payer: Medicare Other | Admitting: Cardiology

## 2011-11-01 VITALS — BP 164/88 | HR 66 | Ht 64.5 in | Wt 229.0 lb

## 2011-11-01 DIAGNOSIS — I10 Essential (primary) hypertension: Secondary | ICD-10-CM | POA: Diagnosis not present

## 2011-11-01 DIAGNOSIS — R079 Chest pain, unspecified: Secondary | ICD-10-CM | POA: Diagnosis not present

## 2011-11-01 NOTE — Progress Notes (Signed)
HPI The patient presents for evaluation of chest pain.  This has been going on off an on for several weeks. It does not happen with exertion although she is very limited.  It is sharp there is no radiation.  It occurs sporadically.  She denies associated symptoms such as nausea or diaphoresis.  She has had no presyncope or syncope.  It is unclear whether this is similar to the chest pain that she had in the past at the time of a previous stress test and caht.   Allergies  Allergen Reactions  . Promethazine Hcl     REACTION: lethargy    Current Outpatient Prescriptions  Medication Sig Dispense Refill  . albuterol (VENTOLIN HFA) 108 (90 BASE) MCG/ACT inhaler Inhale 2 puffs into the lungs every 6 (six) hours as needed for shortness of breath.  1 Inhaler  6  . amLODipine (NORVASC) 5 MG tablet Take 1 tablet by mouth daily.      Marland Kitchen aspirin 81 MG tablet Take 81 mg by mouth daily.        Marland Kitchen atorvastatin (LIPITOR) 40 MG tablet Take 1 tablet (40 mg total) by mouth daily.  30 tablet  6  . budesonide-formoterol (SYMBICORT) 160-4.5 MCG/ACT inhaler Inhale 2 puffs into the lungs 2 (two) times daily.  1 Inhaler  6  . Calcium Carbonate-Vit D-Min (CALTRATE PLUS PO) Take 600 mg by mouth daily.        Marland Kitchen CARAFATE 1 GM/10ML suspension Take 10 mLs by mouth Daily.      . citalopram (CELEXA) 20 MG tablet Take 20 mg by mouth daily.        . cyclobenzaprine (FLEXERIL) 10 MG tablet Take 1 tablet (10 mg total) by mouth daily.  30 tablet  5  . diclofenac (VOLTAREN) 75 MG EC tablet Take 1 tablet by mouth Daily.      Marland Kitchen dicyclomine (BENTYL) 10 MG capsule Take 1 tablet by mouth Three times a day before meals.      Marland Kitchen diltiazem (CARDIZEM CD) 240 MG 24 hr capsule Take 240 mg by mouth daily.        Marland Kitchen donepezil (ARICEPT) 5 MG tablet Take 10 mg by mouth daily.      Marland Kitchen ezetimibe (ZETIA) 10 MG tablet Take 10 mg by mouth daily.        . fluocinonide (LIDEX) 0.05 % gel Apply topically 2 (two) times daily.        . fluticasone  (FLONASE) 50 MCG/ACT nasal spray Place 2 sprays into the nose daily.  16 g  6  . furosemide (LASIX) 80 MG tablet Take 80 mg by mouth 2 (two) times daily.      Marland Kitchen gabapentin (NEURONTIN) 300 MG capsule Take 1 capsule (300 mg total) by mouth 2 (two) times daily.  60 capsule  5  . HUMULIN N 100 UNIT/ML injection Sliding scale w/ every meal      . hydrALAZINE (APRESOLINE) 50 MG tablet Take 1 tablet by mouth Daily.      . insulin glargine (LANTUS) 100 UNIT/ML injection Inject 30 Units into the skin daily.      . Insulin Syringe-Needle U-100 (B-D INS SYR ULTRAFINE 1CC/31G) 31G X 5/16" 1 ML MISC Use as directecd to inject insulin  100 each  5  . KLOR-CON M20 20 MEQ tablet Take 1 tablet by mouth Daily.      Marland Kitchen LIDODERM 5 % 1 patch once daily      . losartan-hydrochlorothiazide (HYZAAR) 100-25 MG  per tablet Take 1 tablet by mouth daily.  30 tablet  11  . metFORMIN (GLUCOPHAGE) 500 MG tablet Take 1 tablet by mouth Twice daily.      . metoCLOPramide (REGLAN) 10 MG tablet Take 10 mg by mouth 4 (four) times daily.        . metolazone (ZAROXOLYN) 2.5 MG tablet Take 1 tablet by mouth Daily.      . metoprolol tartrate (LOPRESSOR) 25 MG tablet Take 1 tablet (25 mg total) by mouth 2 (two) times daily.  60 tablet  11  . Multiple Vitamin (MULTIVITAMIN PO) Take 1 tablet by mouth daily.        Marland Kitchen omeprazole (PRILOSEC) 20 MG capsule Take 40 mg by mouth daily.       . raloxifene (EVISTA) 60 MG tablet Take 60 mg by mouth daily.        Marland Kitchen SINGULAIR 10 MG tablet Take 1 tablet by mouth daily.      . TraMADol HCl 50 MG TBSO Take 1 tablet by mouth 4 (four) times daily as needed.        Marland Kitchen DISCONTD: donepezil (ARICEPT) 5 MG tablet Take 1 tablet (5 mg total) by mouth daily.  30 tablet  5  . Phenylephrine-Chlorphen-DM 1.75-0.75-2.75 MG/ML LIQD Take 5 mLs by mouth every 6 (six) hours as needed.  30 mL  0    Past Medical History  Diagnosis Date  . Allergy   . Depression   . Diabetes mellitus 1997    Type II   . GERD  (gastroesophageal reflux disease)   . Hypertension   . Hyperlipidemia   . Osteoarthritis   . Osteoporosis   . Hair loss   . Carpal tunnel syndrome on left   . Anemia   . CAD (coronary artery disease)     Mild very minimal coronary disease with 20% obtuse marginal stenosis  . CVA (cerebral infarction)   . Diverticulosis   . Esophageal stricture   . Gastritis   . Adenomatous colon polyp   . Sickle cell trait   . Asthma     pt has cpap w/ her today  . COPD (chronic obstructive pulmonary disease)   . 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 Gastroparesis 08/21/2007  . RENAL INSUFFICIENCY 02/16/2009  . OSTEOARTHRITIS 08/09/2006  . Carpal tunnel syndrome, left   . Morbid obesity   . Hernia, hiatal   . CPAP (continuous positive airway pressure) dependence   . CHF (congestive heart failure)   . Stroke   . Sickle cell anemia   . Seizures     Past Surgical History  Procedure Date  . Abdominal hysterectomy   . Replacement total knee 1998    Left  . Esophagogastroduodenoscopy 06/25/2006  . Erd 08/08/2000  . Artery biopsy 01/07/2011    Procedure: MINOR BIOPSY TEMPORAL ARTERY;  Surgeon: Haywood Lasso, MD;  Location: [facility];  Service: General;  Laterality: Left;  left temporal artery biopsy  . Breast lumpectomy     benign  . Breast lumpectomy     both breast lumps removed     ROS:  As stated in the HPI and negative for all other systems.  PHYSICAL EXAM BP 164/88  Pulse 66  Ht 5' 4.5" (1.638 m)  Wt 103.874 kg (229 lb)  BMI 38.70 kg/m2 GENERAL:  Well appearing HEENT:  Pupils equal round and reactive, fundi not visualized, oral mucosa unremarkable NECK:  No jugular venous distention, waveform  within normal limits, carotid upstroke brisk and symmetric, no bruits, no thyromegaly LYMPHATICS:  No cervical, inguinal adenopathy LUNGS:  Clear to auscultation bilaterally BACK:  No CVA tenderness CHEST:  Unremarkable HEART:   PMI not displaced or sustained,S1 and S2 within normal limits, no S3, no S4, no clicks, no rubs, no murmurs ABD:  Flat, positive bowel sounds normal in frequency in pitch, no bruits, no rebound, no guarding, no midline pulsatile mass, no hepatomegaly, no splenomegaly, obese EXT:  2 plus pulses throughout, no edema, no cyanosis no clubbing SKIN:  No rashes no nodules NEURO:  Cranial nerves II through XII grossly intact, motor grossly intact throughout PSYCH:  Cognitively intact, oriented to person place and time  EKG:  Sinus rhythm, rate 66, axis within normal limits, intervals within normal limits, no acute ST-T wave changes.  ASSESSMENT AND PLAN  CHEST PAIN Her chest pain is somewhat atypical but there are some worrisome features. She needs stress testing but would not be a walk a treadmill. Therefore, she will have a [employer].  Of note she did have a mildly abnormal left Lexiscan Myoview in 2011 with mild plaque on cath. I will be looking for changes compared to the previous stress test or high-risk findings.  DYSPNEA  This is at baseline and probably multifactorial. I did review her recent echo which demonstrated no significant abnormalities and she is euvolemic so doubt that diastolic dysfunction is playing much of a role. No further evaluation is warranted other than as above.  HYPERTENSION I reviewed recent blood pressures and this is unusual high. She needs salt fluid restriction and weight loss I will otherwise not change her medications.  OBESITY The patient understands the need to lose weight with diet and exercise. We have discussed specific strategies for this.

## 2011-11-01 NOTE — Patient Instructions (Addendum)
The current medical regimen is effective;  continue present plan and medications.  Your physician has requested that you have a lexiscan myoview. For further information please visit [email or web address]. Please follow instruction sheet, as given.  Follow up as needed. 

## 2011-11-06 ENCOUNTER — Ambulatory Visit (HOSPITAL_COMMUNITY): Payer: Medicare Other | Attending: Cardiology | Admitting: Radiology

## 2011-11-06 VITALS — Ht 64.0 in | Wt 272.0 lb

## 2011-11-06 DIAGNOSIS — E119 Type 2 diabetes mellitus without complications: Secondary | ICD-10-CM | POA: Insufficient documentation

## 2011-11-06 DIAGNOSIS — Z794 Long term (current) use of insulin: Secondary | ICD-10-CM | POA: Insufficient documentation

## 2011-11-06 DIAGNOSIS — R42 Dizziness and giddiness: Secondary | ICD-10-CM | POA: Insufficient documentation

## 2011-11-06 DIAGNOSIS — Z8673 Personal history of transient ischemic attack (TIA), and cerebral infarction without residual deficits: Secondary | ICD-10-CM | POA: Insufficient documentation

## 2011-11-06 DIAGNOSIS — E669 Obesity, unspecified: Secondary | ICD-10-CM | POA: Insufficient documentation

## 2011-11-06 DIAGNOSIS — R55 Syncope and collapse: Secondary | ICD-10-CM | POA: Diagnosis not present

## 2011-11-06 DIAGNOSIS — E785 Hyperlipidemia, unspecified: Secondary | ICD-10-CM | POA: Insufficient documentation

## 2011-11-06 DIAGNOSIS — R0789 Other chest pain: Secondary | ICD-10-CM | POA: Diagnosis not present

## 2011-11-06 DIAGNOSIS — R5383 Other fatigue: Secondary | ICD-10-CM | POA: Insufficient documentation

## 2011-11-06 DIAGNOSIS — R112 Nausea with vomiting, unspecified: Secondary | ICD-10-CM | POA: Insufficient documentation

## 2011-11-06 DIAGNOSIS — R002 Palpitations: Secondary | ICD-10-CM | POA: Insufficient documentation

## 2011-11-06 DIAGNOSIS — R079 Chest pain, unspecified: Secondary | ICD-10-CM

## 2011-11-06 DIAGNOSIS — R0609 Other forms of dyspnea: Secondary | ICD-10-CM | POA: Diagnosis not present

## 2011-11-06 DIAGNOSIS — I10 Essential (primary) hypertension: Secondary | ICD-10-CM | POA: Diagnosis not present

## 2011-11-06 DIAGNOSIS — E7800 Pure hypercholesterolemia, unspecified: Secondary | ICD-10-CM

## 2011-11-06 DIAGNOSIS — Z87891 Personal history of nicotine dependence: Secondary | ICD-10-CM | POA: Diagnosis not present

## 2011-11-06 DIAGNOSIS — R5381 Other malaise: Secondary | ICD-10-CM | POA: Insufficient documentation

## 2011-11-06 DIAGNOSIS — Z8249 Family history of ischemic heart disease and other diseases of the circulatory system: Secondary | ICD-10-CM | POA: Diagnosis not present

## 2011-11-06 DIAGNOSIS — R61 Generalized hyperhidrosis: Secondary | ICD-10-CM | POA: Diagnosis not present

## 2011-11-06 DIAGNOSIS — R0602 Shortness of breath: Secondary | ICD-10-CM | POA: Diagnosis not present

## 2011-11-06 DIAGNOSIS — R0989 Other specified symptoms and signs involving the circulatory and respiratory systems: Secondary | ICD-10-CM | POA: Insufficient documentation

## 2011-11-06 MED ORDER — TECHNETIUM TC 99M SESTAMIBI GENERIC - CARDIOLITE
30.0000 | Freq: Once | INTRAVENOUS | Status: AC | PRN
  Administered 2011-11-06: 30 via INTRAVENOUS

## 2011-11-06 MED ORDER — AMINOPHYLLINE 25 MG/ML IV SOLN
75.0000 mg | Freq: Once | INTRAVENOUS | Status: AC
  Administered 2011-11-06: 75 mg via INTRAVENOUS

## 2011-11-06 MED ORDER — REGADENOSON 0.4 MG/5ML IV SOLN
0.4000 mg | Freq: Once | INTRAVENOUS | Status: AC
  Administered 2011-11-06: 0.4 mg via INTRAVENOUS

## 2011-11-06 NOTE — Progress Notes (Signed)
[facility] [id number] [place] [address] [phone]  Cardiology Nuclear Med Study  Joann Gutierrez is a 67 y.o. female     MRN : [id number]     DOB: 04-12-1945  Procedure Date: 11/06/2011  Nuclear Med Background Indication for Stress Test:  Evaluation for Ischemia History:  '05 Myocardial Perfusion Study-?, Apical ischemia,'11 Heart Catheterization-Normal Coronaries, EF=65%, 04-2011 Echo: EF=55-60% Cardiac Risk Factors: CVA, Family History - CAD, History of Smoking, Hypertension, IDDM Type 2, Lipids, Obesity and TIA  Symptoms: Chest Pain and Chest pressure with/without exertion (last occurrence 4 days),  Diaphoresis, Dizziness, DOE, Fatigue, Fatigue with Exertion, Light-Headedness, Nausea, Near Syncope, Palpitations, Rapid HR, SOB and Vomiting   Nuclear Pre-Procedure Caffeine/Decaff Intake:  None > 12 hrs NPO After: 4:00pm   Lungs:  Clear, O2 Sat: 93% on room air. Proair inhaler 2 sp prior to test  IV 0.9% NS with Angio Cath:  22g  IV Site: R Antecubital x 1, tolerated well IV Started by:  Irven Baltimore, RN  Chest Size (in):  42 Cup Size: B  Height: 5\' 4"  (1.626 m)  Weight:  272 lb (123.378 kg)  BMI:  Body mass index is 46.69 kg/(m^2). Tech Comments:  FBS was 96 at 8:00am.     Nuclear Med Study 1 or 2 day study: 2 day  Stress Test Type:  Lexiscan, Aminophylline 75 mg IVP  Reading MD: Dorris Carnes, MD  Order Authorizing Provider:  Minus Breeding, MD  Resting Radionuclide: Technetium 7m Sestamibi  Resting Radionuclide Dose: 33.0 mCi on 11/11/11   Stress Radionuclide:  Technetium 52m Sestamibi  Stress Radionuclide Dose: 33.0 mCi on 11/06/11           Stress Protocol Rest HR: 73 Stress HR: 108  Rest BP: 151/64 Stress BP: 147/40  Exercise Time (min): n/a METS: n/a   Predicted Max HR: 154 bpm % Max HR: 70.13 bpm Rate Pressure Product: 15876   Dose of Adenosine (mg):  n/a Dose of Lexiscan: 0.4 mg  Dose of  Atropine (mg): n/a Dose of Dobutamine: n/a mcg/kg/min (at max HR)  Stress Test Technologist: Irven Baltimore, RN  Nuclear Technologist:  Charlton Amor, CNMT     Rest Procedure:  Myocardial perfusion imaging was performed at rest 45 minutes following the intravenous administration of Technetium 86m Sestamibi. Rest ECG: NSR with poor R wave progression  Stress Procedure:  The patient received IV Lexiscan 0.4 mg over 15-seconds.  Technetium 23m Sestamibi injected at 30-seconds.  There were no significant changes with Lexiscan. Quantitative spect images were obtained after a 45 minute delay.  The patient complained of persistent chest tightness, and felt bad in general after Lexiscan that was relieved quickly after Aminophylline 75 mg IVP given. Irven Baltimore, RN.  Stress ECG: No significant change from baseline ECG  QPS Raw Data Images:  Soft tissue (diaphragm) underlies the heart. Stress Images:  Normal homogeneous uptake in all areas of the myocardium. Rest Images:  Normal homogeneous uptake in all areas of the myocardium. Subtraction (SDS):  No evidence of ischemia. Transient Ischemic Dilatation (Normal <1.22):  0.67 Lung/Heart Ratio (Normal <0.45):  0.32  Quantitative Gated Spect Images QGS EDV:  96 ml QGS ESV:  27 ml  Impression Exercise Capacity:  Lexiscan with low level exercise. BP Response:  Normal blood pressure response. Clinical Symptoms:  Mild chest pain/dyspnea. ECG Impression:  No significant ST segment change suggestive of ischemia. Comparison with Prior Nuclear Study: No significant change from  report of 2005.  Overall Impression:  Normal stress nuclear study.  LV Ejection Fraction: 72%.  LV Wall Motion:  NL LV Function; NL Wall Motion  Dorris Carnes

## 2011-11-08 DIAGNOSIS — E1129 Type 2 diabetes mellitus with other diabetic kidney complication: Secondary | ICD-10-CM | POA: Diagnosis not present

## 2011-11-08 DIAGNOSIS — I10 Essential (primary) hypertension: Secondary | ICD-10-CM | POA: Diagnosis not present

## 2011-11-08 DIAGNOSIS — E7800 Pure hypercholesterolemia, unspecified: Secondary | ICD-10-CM | POA: Diagnosis not present

## 2011-11-08 DIAGNOSIS — Z760 Encounter for issue of repeat prescription: Secondary | ICD-10-CM | POA: Diagnosis not present

## 2011-11-08 DIAGNOSIS — Z23 Encounter for immunization: Secondary | ICD-10-CM | POA: Diagnosis not present

## 2011-11-11 ENCOUNTER — Ambulatory Visit (HOSPITAL_COMMUNITY): Payer: Medicare Other | Attending: Internal Medicine | Admitting: Radiology

## 2011-11-11 DIAGNOSIS — R0989 Other specified symptoms and signs involving the circulatory and respiratory systems: Secondary | ICD-10-CM

## 2011-11-11 MED ORDER — TECHNETIUM TC 99M SESTAMIBI GENERIC - CARDIOLITE
33.0000 | Freq: Once | INTRAVENOUS | Status: AC | PRN
  Administered 2011-11-11: 33 via INTRAVENOUS

## 2011-11-12 ENCOUNTER — Other Ambulatory Visit: Payer: Self-pay | Admitting: General Practice

## 2011-11-12 MED ORDER — "INSULIN SYRINGE-NEEDLE U-100 31G X 5/16"" 1 ML MISC": Status: DC

## 2011-11-27 DIAGNOSIS — B351 Tinea unguium: Secondary | ICD-10-CM | POA: Diagnosis not present

## 2011-11-27 DIAGNOSIS — E119 Type 2 diabetes mellitus without complications: Secondary | ICD-10-CM | POA: Diagnosis not present

## 2011-11-27 DIAGNOSIS — L608 Other nail disorders: Secondary | ICD-10-CM | POA: Diagnosis not present

## 2011-11-27 DIAGNOSIS — M79609 Pain in unspecified limb: Secondary | ICD-10-CM | POA: Diagnosis not present

## 2011-12-04 ENCOUNTER — Encounter: Payer: Self-pay | Admitting: Pulmonary Disease

## 2011-12-04 ENCOUNTER — Ambulatory Visit (INDEPENDENT_AMBULATORY_CARE_PROVIDER_SITE_OTHER): Payer: Medicare Other | Admitting: Pulmonary Disease

## 2011-12-04 VITALS — BP 130/74 | HR 72 | Temp 98.4°F | Ht 64.5 in | Wt 286.8 lb

## 2011-12-04 DIAGNOSIS — J019 Acute sinusitis, unspecified: Secondary | ICD-10-CM

## 2011-12-04 DIAGNOSIS — G4733 Obstructive sleep apnea (adult) (pediatric): Secondary | ICD-10-CM | POA: Diagnosis not present

## 2011-12-04 DIAGNOSIS — J449 Chronic obstructive pulmonary disease, unspecified: Secondary | ICD-10-CM

## 2011-12-04 MED ORDER — AMOXICILLIN-POT CLAVULANATE 875-125 MG PO TABS: 1.0000 | ORAL_TABLET | Freq: Two times a day (BID) | ORAL | Status: DC

## 2011-12-04 NOTE — Progress Notes (Signed)
Chief Complaint  Patient presents with  . Follow-up    breathing is worse at rest and can only walk about 4-5 feet and feels SOB, cough w/ green phlem, wheezing, chest tx, chills, sweats, left side facial pressure, HA x last month  . Sleep Apnea    wears cpap everynight x 6-8 hrs a night. denies any problem w/ mask/machine. she also uses her oxygen at bedtime as well    History of Present Illness: Joann Gutierrez is a 66 y.o. female former smoker with dyspnea form COPD with asthma, and OSA.  She was treated for an exacerbation in October with augmentin.  She improved, but then felt like she got sick again after she got her flu shot.  She has sinus pressure over her left face. She has post-nasal drip and sore throat.  She has cough with green sputum.  She has been feeling hot.  She gets wheeze, and uses her ventolin.  She denies abdominal symptoms or sick exposures.    She reports compliance and benefit from CPAP.  Tests:  Spirometry 03/15/09>>FEV1 1.35(61%), FVC 1.72(57%), FEV1% 78, +BD PSG 05/08/09>> AHI 11, RDI 21  Echo 05/21/11>>mild LVH, EF 55 to 60%, mild RA dilation, mild RV systolic dysfx, mild/mod TR, PAS 45 mmHg ONO with CPAP and room air 07/04/11 >> Test time 9 hrs 18 min. Mean SpO2 90%, low SpO2 71%. Spent 1 hr 24 min with SpO2 < 88%. Nuclear stress test 11/19/11>>normal, EF 72%  Past Medical History  Diagnosis Date  . Allergy   . Depression   . Diabetes mellitus 1997    Type II   . GERD (gastroesophageal reflux disease)   . Hypertension   . Hyperlipidemia   . Osteoarthritis   . Osteoporosis   . Hair loss   . Carpal tunnel syndrome on left   . Anemia   . CAD (coronary artery disease)     Mild very minimal coronary disease with 20% obtuse marginal stenosis  . CVA (cerebral infarction)   . Diverticulosis   . Esophageal stricture   . Gastritis   . Adenomatous colon polyp   . Sickle cell trait   . Asthma     pt has cpap w/ her today  . COPD (chronic  obstructive pulmonary disease)   . 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 Gastroparesis 08/21/2007  . RENAL INSUFFICIENCY 02/16/2009  . OSTEOARTHRITIS 08/09/2006  . Carpal tunnel syndrome, left   . Morbid obesity   . Hernia, hiatal   . CPAP (continuous positive airway pressure) dependence   . CHF (congestive heart failure)   . Sickle cell anemia   . Seizures     Past Surgical History  Procedure Date  . Abdominal hysterectomy   . Replacement total knee 1998    Left  . Esophagogastroduodenoscopy 06/25/2006  . Erd 08/08/2000  . Artery biopsy 01/07/2011    Procedure: MINOR BIOPSY TEMPORAL ARTERY;  Surgeon: Haywood Lasso, MD;  Location: [facility];  Service: General;  Laterality: Left;  left temporal artery biopsy  . Breast lumpectomy     benign  . Breast lumpectomy     both breast lumps removed     Current Outpatient Prescriptions on File Prior to Visit  Medication Sig Dispense Refill  . albuterol (VENTOLIN HFA) 108 (90 BASE) MCG/ACT inhaler Inhale 2 puffs into the lungs every 6 (six) hours as needed for shortness of breath.  1 Inhaler  6  . amLODipine (NORVASC)  5 MG tablet Take 1 tablet by mouth daily.      Marland Kitchen aspirin 81 MG tablet Take 81 mg by mouth daily.        Marland Kitchen atorvastatin (LIPITOR) 40 MG tablet Take 1 tablet (40 mg total) by mouth daily.  30 tablet  6  . budesonide-formoterol (SYMBICORT) 160-4.5 MCG/ACT inhaler Inhale 2 puffs into the lungs 2 (two) times daily.  1 Inhaler  6  . Calcium Carbonate-Vit D-Min (CALTRATE PLUS PO) Take 600 mg by mouth daily.        Marland Kitchen CARAFATE 1 GM/10ML suspension Take 10 mLs by mouth Daily.      . citalopram (CELEXA) 20 MG tablet Take 20 mg by mouth daily.        . cyclobenzaprine (FLEXERIL) 10 MG tablet Take 1 tablet (10 mg total) by mouth daily.  30 tablet  5  . diclofenac (VOLTAREN) 75 MG EC tablet Take 1 tablet by mouth Daily.      Marland Kitchen dicyclomine (BENTYL) 10 MG capsule Take 1 tablet by  mouth Three times a day before meals.      Marland Kitchen diltiazem (CARDIZEM CD) 240 MG 24 hr capsule Take 240 mg by mouth daily.        Marland Kitchen donepezil (ARICEPT) 5 MG tablet Take 10 mg by mouth daily.      Marland Kitchen ezetimibe (ZETIA) 10 MG tablet Take 10 mg by mouth daily.        . fluticasone (FLONASE) 50 MCG/ACT nasal spray Place 2 sprays into the nose daily.  16 g  6  . furosemide (LASIX) 80 MG tablet Take 80 mg by mouth 2 (two) times daily.      Marland Kitchen gabapentin (NEURONTIN) 300 MG capsule Take 1 capsule (300 mg total) by mouth 2 (two) times daily.  60 capsule  5  . HUMULIN N 100 UNIT/ML injection Sliding scale w/ every meal      . hydrALAZINE (APRESOLINE) 50 MG tablet Take 1 tablet by mouth Daily.      . insulin glargine (LANTUS) 100 UNIT/ML injection Inject 30 Units into the skin daily.      . Insulin Syringe-Needle U-100 (B-D INS SYR ULTRAFINE 1CC/31G) 31G X 5/16" 1 ML MISC Use as directecd to inject insulin  100 each  5  . KLOR-CON M20 20 MEQ tablet Take 1 tablet by mouth Daily.      Marland Kitchen LIDODERM 5 % 1 patch once daily      . losartan-hydrochlorothiazide (HYZAAR) 100-25 MG per tablet Take 1 tablet by mouth daily.  30 tablet  11  . metFORMIN (GLUCOPHAGE) 500 MG tablet Take 1 tablet by mouth Twice daily.      . metoCLOPramide (REGLAN) 10 MG tablet Take 10 mg by mouth 4 (four) times daily.        . metolazone (ZAROXOLYN) 2.5 MG tablet Take 1 tablet by mouth Daily.      . metoprolol tartrate (LOPRESSOR) 25 MG tablet Take 1 tablet (25 mg total) by mouth 2 (two) times daily.  60 tablet  11  . Multiple Vitamin (MULTIVITAMIN PO) Take 1 tablet by mouth daily.        Marland Kitchen omeprazole (PRILOSEC) 20 MG capsule Take 40 mg by mouth daily.       . raloxifene (EVISTA) 60 MG tablet Take 60 mg by mouth daily.        Marland Kitchen SINGULAIR 10 MG tablet Take 1 tablet by mouth daily.      . TraMADol HCl 50 MG  TBSO Take 1 tablet by mouth 4 (four) times daily as needed.          Allergies  Allergen Reactions  . Promethazine Hcl     REACTION:  lethargy    Physical Exam: Filed Vitals:   12/04/11 1339 12/04/11 1340  BP:  130/74  Pulse:  72  Temp: 98.4 F (36.9 C)   TempSrc: Oral   Height: 5' 4.5" (1.638 m)   Weight: 286 lb 12.8 oz (130.092 kg)   SpO2:  96%  ,  Current Encounter SPO2  12/04/11 1340 96%  10/23/11 1212 98%  07/19/11 1531 95%    Wt Readings from Last 3 Encounters:  12/04/11 286 lb 12.8 oz (130.092 kg)  11/06/11 272 lb (123.378 kg)  11/01/11 229 lb (103.874 kg)    Body mass index is 48.47 kg/(m^2).   General - No distress ENT - tender over Lt maxillary sinus, mild erythema posterior pharynx, no LAN Cardiac - s1s2 regular, no murmur, pulses symmetric Chest - No wheeze/rales/dullness Back - No focal tenderness Abd - Soft, non-tender Ext - 2+ non pitting lower extremity edema Neuro - Normal strength, cranial nerves intact Skin - No rashes Psych - Normal mood, and behavior.   Assessment/Plan:  [name], MD [facility] Pager:  [phone] 12/04/2011, 1:47 PM

## 2011-12-04 NOTE — Assessment & Plan Note (Signed)
I don't think she needs chest xray or prednisone at this time.  She is to continue symbicort, singulair, and prn ventolin.

## 2011-12-04 NOTE — Assessment & Plan Note (Signed)
Continue CPAP 9 cm H2O with 2 liters oxygen at night.

## 2011-12-04 NOTE — Assessment & Plan Note (Signed)
Will give one week of augmentin.  Advise her to call and take an additional week if she is not better.  She is to continue sinus regimen and flonase.

## 2011-12-04 NOTE — Patient Instructions (Signed)
Augmentin 1 pill twice per day for 1 week.  If not better, then call our office and take antibiotic for an additional week Follow up in 6 months

## 2011-12-06 DIAGNOSIS — IMO0001 Reserved for inherently not codable concepts without codable children: Secondary | ICD-10-CM | POA: Diagnosis not present

## 2011-12-06 DIAGNOSIS — R700 Elevated erythrocyte sedimentation rate: Secondary | ICD-10-CM | POA: Diagnosis not present

## 2011-12-06 DIAGNOSIS — M1990 Unspecified osteoarthritis, unspecified site: Secondary | ICD-10-CM | POA: Diagnosis not present

## 2011-12-06 DIAGNOSIS — M2550 Pain in unspecified joint: Secondary | ICD-10-CM | POA: Diagnosis not present

## 2012-01-13 DIAGNOSIS — N183 Chronic kidney disease, stage 3 unspecified: Secondary | ICD-10-CM | POA: Diagnosis not present

## 2012-01-13 DIAGNOSIS — D649 Anemia, unspecified: Secondary | ICD-10-CM | POA: Diagnosis not present

## 2012-01-13 DIAGNOSIS — N2581 Secondary hyperparathyroidism of renal origin: Secondary | ICD-10-CM | POA: Diagnosis not present

## 2012-01-16 ENCOUNTER — Other Ambulatory Visit: Payer: Self-pay | Admitting: *Deleted

## 2012-01-16 DIAGNOSIS — N2581 Secondary hyperparathyroidism of renal origin: Secondary | ICD-10-CM | POA: Diagnosis not present

## 2012-01-16 DIAGNOSIS — D649 Anemia, unspecified: Secondary | ICD-10-CM | POA: Diagnosis not present

## 2012-01-16 MED ORDER — FLUTICASONE PROPIONATE 50 MCG/ACT NA SUSP: 2.0000 | Freq: Every day | NASAL | Status: DC

## 2012-01-23 DIAGNOSIS — N2581 Secondary hyperparathyroidism of renal origin: Secondary | ICD-10-CM | POA: Diagnosis not present

## 2012-01-23 DIAGNOSIS — R609 Edema, unspecified: Secondary | ICD-10-CM | POA: Diagnosis not present

## 2012-01-23 DIAGNOSIS — I129 Hypertensive chronic kidney disease with stage 1 through stage 4 chronic kidney disease, or unspecified chronic kidney disease: Secondary | ICD-10-CM | POA: Diagnosis not present

## 2012-02-13 ENCOUNTER — Encounter: Payer: Self-pay | Admitting: Gastroenterology

## 2012-02-13 DIAGNOSIS — R413 Other amnesia: Secondary | ICD-10-CM | POA: Diagnosis not present

## 2012-02-13 DIAGNOSIS — I10 Essential (primary) hypertension: Secondary | ICD-10-CM | POA: Diagnosis not present

## 2012-02-13 DIAGNOSIS — Z760 Encounter for issue of repeat prescription: Secondary | ICD-10-CM | POA: Diagnosis not present

## 2012-02-13 DIAGNOSIS — E1129 Type 2 diabetes mellitus with other diabetic kidney complication: Secondary | ICD-10-CM | POA: Diagnosis not present

## 2012-02-13 DIAGNOSIS — E7800 Pure hypercholesterolemia, unspecified: Secondary | ICD-10-CM | POA: Diagnosis not present

## 2012-02-19 DIAGNOSIS — M949 Disorder of cartilage, unspecified: Secondary | ICD-10-CM | POA: Diagnosis not present

## 2012-02-19 DIAGNOSIS — Z01419 Encounter for gynecological examination (general) (routine) without abnormal findings: Secondary | ICD-10-CM | POA: Diagnosis not present

## 2012-02-19 DIAGNOSIS — Z13820 Encounter for screening for osteoporosis: Secondary | ICD-10-CM | POA: Diagnosis not present

## 2012-02-19 DIAGNOSIS — Z1231 Encounter for screening mammogram for malignant neoplasm of breast: Secondary | ICD-10-CM | POA: Diagnosis not present

## 2012-02-19 DIAGNOSIS — M899 Disorder of bone, unspecified: Secondary | ICD-10-CM | POA: Diagnosis not present

## 2012-02-20 DIAGNOSIS — G309 Alzheimer's disease, unspecified: Secondary | ICD-10-CM | POA: Diagnosis not present

## 2012-02-20 DIAGNOSIS — I509 Heart failure, unspecified: Secondary | ICD-10-CM | POA: Diagnosis not present

## 2012-02-20 DIAGNOSIS — M549 Dorsalgia, unspecified: Secondary | ICD-10-CM | POA: Diagnosis not present

## 2012-02-20 DIAGNOSIS — J449 Chronic obstructive pulmonary disease, unspecified: Secondary | ICD-10-CM | POA: Diagnosis not present

## 2012-02-20 DIAGNOSIS — Z9181 History of falling: Secondary | ICD-10-CM | POA: Diagnosis not present

## 2012-02-20 DIAGNOSIS — F0280 Dementia in other diseases classified elsewhere without behavioral disturbance: Secondary | ICD-10-CM | POA: Diagnosis not present

## 2012-02-20 DIAGNOSIS — M1710 Unilateral primary osteoarthritis, unspecified knee: Secondary | ICD-10-CM | POA: Diagnosis not present

## 2012-02-20 DIAGNOSIS — E669 Obesity, unspecified: Secondary | ICD-10-CM | POA: Diagnosis not present

## 2012-02-20 DIAGNOSIS — I129 Hypertensive chronic kidney disease with stage 1 through stage 4 chronic kidney disease, or unspecified chronic kidney disease: Secondary | ICD-10-CM | POA: Diagnosis not present

## 2012-02-20 DIAGNOSIS — Z8673 Personal history of transient ischemic attack (TIA), and cerebral infarction without residual deficits: Secondary | ICD-10-CM | POA: Diagnosis not present

## 2012-02-20 DIAGNOSIS — Z794 Long term (current) use of insulin: Secondary | ICD-10-CM | POA: Diagnosis not present

## 2012-02-20 DIAGNOSIS — E119 Type 2 diabetes mellitus without complications: Secondary | ICD-10-CM | POA: Diagnosis not present

## 2012-02-24 DIAGNOSIS — G309 Alzheimer's disease, unspecified: Secondary | ICD-10-CM | POA: Diagnosis not present

## 2012-02-24 DIAGNOSIS — F0280 Dementia in other diseases classified elsewhere without behavioral disturbance: Secondary | ICD-10-CM | POA: Diagnosis not present

## 2012-02-24 DIAGNOSIS — M1710 Unilateral primary osteoarthritis, unspecified knee: Secondary | ICD-10-CM | POA: Diagnosis not present

## 2012-02-24 DIAGNOSIS — E119 Type 2 diabetes mellitus without complications: Secondary | ICD-10-CM | POA: Diagnosis not present

## 2012-02-24 DIAGNOSIS — I129 Hypertensive chronic kidney disease with stage 1 through stage 4 chronic kidney disease, or unspecified chronic kidney disease: Secondary | ICD-10-CM | POA: Diagnosis not present

## 2012-02-27 DIAGNOSIS — G309 Alzheimer's disease, unspecified: Secondary | ICD-10-CM | POA: Diagnosis not present

## 2012-02-27 DIAGNOSIS — E119 Type 2 diabetes mellitus without complications: Secondary | ICD-10-CM | POA: Diagnosis not present

## 2012-02-27 DIAGNOSIS — F0280 Dementia in other diseases classified elsewhere without behavioral disturbance: Secondary | ICD-10-CM | POA: Diagnosis not present

## 2012-02-27 DIAGNOSIS — N949 Unspecified condition associated with female genital organs and menstrual cycle: Secondary | ICD-10-CM | POA: Diagnosis not present

## 2012-02-27 DIAGNOSIS — M1710 Unilateral primary osteoarthritis, unspecified knee: Secondary | ICD-10-CM | POA: Diagnosis not present

## 2012-02-27 DIAGNOSIS — R1084 Generalized abdominal pain: Secondary | ICD-10-CM | POA: Diagnosis not present

## 2012-02-27 DIAGNOSIS — I129 Hypertensive chronic kidney disease with stage 1 through stage 4 chronic kidney disease, or unspecified chronic kidney disease: Secondary | ICD-10-CM | POA: Diagnosis not present

## 2012-02-28 DIAGNOSIS — E119 Type 2 diabetes mellitus without complications: Secondary | ICD-10-CM | POA: Diagnosis not present

## 2012-02-28 DIAGNOSIS — L608 Other nail disorders: Secondary | ICD-10-CM | POA: Diagnosis not present

## 2012-02-28 DIAGNOSIS — M79609 Pain in unspecified limb: Secondary | ICD-10-CM | POA: Diagnosis not present

## 2012-03-02 DIAGNOSIS — E119 Type 2 diabetes mellitus without complications: Secondary | ICD-10-CM | POA: Diagnosis not present

## 2012-03-02 DIAGNOSIS — F0280 Dementia in other diseases classified elsewhere without behavioral disturbance: Secondary | ICD-10-CM | POA: Diagnosis not present

## 2012-03-02 DIAGNOSIS — M1710 Unilateral primary osteoarthritis, unspecified knee: Secondary | ICD-10-CM | POA: Diagnosis not present

## 2012-03-02 DIAGNOSIS — I129 Hypertensive chronic kidney disease with stage 1 through stage 4 chronic kidney disease, or unspecified chronic kidney disease: Secondary | ICD-10-CM | POA: Diagnosis not present

## 2012-03-03 DIAGNOSIS — E785 Hyperlipidemia, unspecified: Secondary | ICD-10-CM | POA: Diagnosis not present

## 2012-03-03 DIAGNOSIS — E1129 Type 2 diabetes mellitus with other diabetic kidney complication: Secondary | ICD-10-CM | POA: Diagnosis not present

## 2012-03-03 DIAGNOSIS — R071 Chest pain on breathing: Secondary | ICD-10-CM | POA: Diagnosis not present

## 2012-03-04 DIAGNOSIS — E119 Type 2 diabetes mellitus without complications: Secondary | ICD-10-CM | POA: Diagnosis not present

## 2012-03-04 DIAGNOSIS — G309 Alzheimer's disease, unspecified: Secondary | ICD-10-CM | POA: Diagnosis not present

## 2012-03-04 DIAGNOSIS — I129 Hypertensive chronic kidney disease with stage 1 through stage 4 chronic kidney disease, or unspecified chronic kidney disease: Secondary | ICD-10-CM | POA: Diagnosis not present

## 2012-03-04 DIAGNOSIS — M1710 Unilateral primary osteoarthritis, unspecified knee: Secondary | ICD-10-CM | POA: Diagnosis not present

## 2012-03-04 DIAGNOSIS — F0280 Dementia in other diseases classified elsewhere without behavioral disturbance: Secondary | ICD-10-CM | POA: Diagnosis not present

## 2012-03-10 DIAGNOSIS — F0280 Dementia in other diseases classified elsewhere without behavioral disturbance: Secondary | ICD-10-CM | POA: Diagnosis not present

## 2012-03-10 DIAGNOSIS — I129 Hypertensive chronic kidney disease with stage 1 through stage 4 chronic kidney disease, or unspecified chronic kidney disease: Secondary | ICD-10-CM | POA: Diagnosis not present

## 2012-03-10 DIAGNOSIS — M1710 Unilateral primary osteoarthritis, unspecified knee: Secondary | ICD-10-CM | POA: Diagnosis not present

## 2012-03-10 DIAGNOSIS — E119 Type 2 diabetes mellitus without complications: Secondary | ICD-10-CM | POA: Diagnosis not present

## 2012-03-11 ENCOUNTER — Ambulatory Visit (INDEPENDENT_AMBULATORY_CARE_PROVIDER_SITE_OTHER): Payer: Medicare Other | Admitting: Gastroenterology

## 2012-03-11 ENCOUNTER — Encounter: Payer: Self-pay | Admitting: Gastroenterology

## 2012-03-11 VITALS — BP 132/78 | HR 72 | Ht 64.5 in | Wt 277.8 lb

## 2012-03-11 DIAGNOSIS — R1310 Dysphagia, unspecified: Secondary | ICD-10-CM

## 2012-03-11 DIAGNOSIS — Z800 Family history of malignant neoplasm of digestive organs: Secondary | ICD-10-CM

## 2012-03-11 DIAGNOSIS — K3184 Gastroparesis: Secondary | ICD-10-CM

## 2012-03-11 NOTE — Patient Instructions (Addendum)
You have been scheduled for an endoscopy with propofol. Please follow written instructions given to you at your visit today. If you use inhalers (even only as needed) or a CPAP machine, please bring them with you on the day of your procedure. 

## 2012-03-11 NOTE — Assessment & Plan Note (Signed)
Follow-up colonoscopy 2017 

## 2012-03-11 NOTE — Progress Notes (Signed)
History of Present Illness:  The patient has returned for evaluation of dysphagia. She's having dysphagia to solids and liquids. She has a history of an esophageal stricture and was last dilated in July, 2012. She has well-established gastroparesis for which she takes metoclopramide. She has no other GI complaints.    Review of Systems: Pertinent positive and negative review of systems were noted in the above HPI section. All other review of systems were otherwise negative.    Current Medications, Allergies, Past Medical History, Past Surgical History, Family History and Social History were reviewed in [facility] record  Vital signs were reviewed in today's medical record. Physical Exam: General: Well developed , well nourished, no acute distress Skin: anicteric Head: Normocephalic and atraumatic Eyes:  sclerae anicteric, EOMI Ears: Normal auditory acuity Mouth: No deformity or lesions Lungs: Clear throughout to auscultation Heart: Regular rate and rhythm; no murmurs, rubs or bruits Abdomen: Soft, non tender and non distended. No masses, hepatosplenomegaly or hernias noted. Normal Bowel sounds. There is no succussion splash Rectal:deferred Musculoskeletal: Symmetrical with no gross deformities  Pulses:  Normal pulses noted Extremities: No clubbing, cyanosis, edema or deformities noted Neurological: Alert oriented x 4, grossly nonfocal Psychological:  Alert and cooperative. Normal mood and affect

## 2012-03-11 NOTE — Assessment & Plan Note (Addendum)
Well-controlled with metoclopramide without apparent side effects

## 2012-03-11 NOTE — Assessment & Plan Note (Signed)
Patient is symptomatic again likely 2 to a recurrent esophageal stricture.  Recommendations #1 upper endoscopy with dilatation as indicated

## 2012-03-17 ENCOUNTER — Ambulatory Visit (AMBULATORY_SURGERY_CENTER): Payer: Medicare Other | Admitting: Gastroenterology

## 2012-03-17 ENCOUNTER — Other Ambulatory Visit: Payer: Self-pay | Admitting: Gastroenterology

## 2012-03-17 ENCOUNTER — Encounter: Payer: Self-pay | Admitting: Gastroenterology

## 2012-03-17 VITALS — BP 145/86 | HR 86 | Temp 97.6°F | Resp 19 | Ht 64.5 in | Wt 277.0 lb

## 2012-03-17 DIAGNOSIS — G4733 Obstructive sleep apnea (adult) (pediatric): Secondary | ICD-10-CM | POA: Diagnosis not present

## 2012-03-17 DIAGNOSIS — N289 Disorder of kidney and ureter, unspecified: Secondary | ICD-10-CM | POA: Diagnosis not present

## 2012-03-17 DIAGNOSIS — K3184 Gastroparesis: Secondary | ICD-10-CM

## 2012-03-17 LAB — GLUCOSE, CAPILLARY
Glucose-Capillary: 56 mg/dL — ABNORMAL LOW (ref 70–99)
Glucose-Capillary: 59 mg/dL — ABNORMAL LOW (ref 70–99)
Glucose-Capillary: 111 mg/dL — ABNORMAL HIGH (ref 70–99)
Glucose-Capillary: 95 mg/dL (ref 70–99)

## 2012-03-17 MED ORDER — DEXTROSE 5 % IV SOLN: INTRAVENOUS | Status: DC

## 2012-03-17 NOTE — Progress Notes (Signed)
Called to room to assist during endoscopic procedure.  Patient ID and intended procedure confirmed with present staff. Received instructions for my participation in the procedure from the performing physician.  

## 2012-03-17 NOTE — Patient Instructions (Addendum)
YOU HAD AN ENDOSCOPIC PROCEDURE TODAY AT [facility]: Refer to the procedure report that was given to you for any specific questions about what was found during the examination.  If the procedure report does not answer your questions, please call your gastroenterologist to clarify.  If you requested that your care partner not be given the details of your procedure findings, then the procedure report has been included in a sealed envelope for you to review at your convenience later.  YOU SHOULD EXPECT: Some feelings of bloating in the abdomen. Passage of more gas than usual.  Walking can help get rid of the air that was put into your GI tract during the procedure and reduce the bloating. If you had a lower endoscopy (such as a colonoscopy or flexible sigmoidoscopy) you may notice spotting of blood in your stool or on the toilet paper. If you underwent a bowel prep for your procedure, then you may not have a normal bowel movement for a few days.  DIET:  PLEASE HAVE A CLEAR LIQUID ABOUT 5PM.  THEN YOU CAN HAVE A SOFT DIET FOR THE REST OF TONIGHT.  Drink plenty of fluids but you should avoid alcoholic beverages for 24 hours.  ACTIVITY: Your care partner should take you home directly after the procedure.  You should plan to take it easy, moving slowly for the rest of the day.  You can resume normal activity the day after the procedure however you should NOT DRIVE or use heavy machinery for 24 hours (because of the sedation medicines used during the test).    SYMPTOMS TO REPORT IMMEDIATELY: A gastroenterologist can be reached at any hour.  During normal business hours, 8:30 AM to 5:00 PM Monday through Friday, call [phone].  After hours and on weekends, please call the GI answering service at [phone] who will take a message and have the physician on call contact you.  Following upper endoscopy (EGD)  Vomiting of blood or coffee ground material  New chest pain or pain under the  shoulder blades  Painful or persistently difficult swallowing  New shortness of breath  Fever of 100F or higher  Black, tarry-looking stools  FOLLOW UP: If any biopsies were taken you will be contacted by phone or by letter within the next 1-3 weeks.  Call your gastroenterologist if you have not heard about the biopsies in 3 weeks.  Our staff will call the home number listed on your records the next business day following your procedure to check on you and address any questions or concerns that you may have at that time regarding the information given to you following your procedure. This is a courtesy call and so if there is no answer at the home number and we have not heard from you through the emergency physician on call, we will assume that you have returned to your regular daily activities without incident.  SIGNATURES/CONFIDENTIALITY: You and/or your care partner have signed paperwork which will be entered into your electronic medical record.  These signatures attest to the fact that that the information above on your After Visit Summary has been reviewed and is understood.  Full responsibility of the confidentiality of this discharge information lies with you and/or your care-partner.

## 2012-03-17 NOTE — Op Note (Signed)
[place]  [address] [name], [address]   ENDOSCOPY PROCEDURE REPORT  PATIENT: Joann Gutierrez, Joann Gutierrez  MR#: [id number] BIRTHDATE: 09-03-1945 , 66  yrs. old GENDER: Female ENDOSCOPIST: Inda Castle, MD REFERRED BY:  Seward Carol, M.D. PROCEDURE DATE:  03/17/2012 PROCEDURE:  EGD, diagnostic and Maloney dilation of esophagus ASA CLASS:     Class II INDICATIONS:  Dysphagia.   h/o esophageal stricture. MEDICATIONS: MAC sedation, administered by CRNA, propofol (Diprivan) 100mg  IV, Robinul 0.2 mg IV, and Simethicone 0.6cc PO TOPICAL ANESTHETIC: Cetacaine Spray  DESCRIPTION OF PROCEDURE: After the risks benefits and alternatives of the procedure were thoroughly explained, informed consent was obtained.  The LB-GIF Q180 [id number] endoscope was introduced through the mouth and advanced to the third portion of the duodenum. Without limitations.  The instrument was slowly withdrawn as the mucosa was fully examined.      There was an early stricture at the GE junction.  The 9.8 mm gastroscope easily traversed this area.   There was an early stricture at the GE junction.  The 9.8 mm gastroscope easily traversed this area.   The remainder of the upper endoscopy exam was otherwise normal.  Retroflexed views revealed no abnormalities. The scope was then withdrawn from the patient  A 52 [place] Maloney dilator was passed with mild resistance. There was no heme.  COMPLICATIONS: There were no complications.  ENDOSCOPIC IMPRESSION: recurrent esophageal stricture-status post Venia Minks dilation  RECOMMENDATIONS: Repeat dilatation as needed Continue PPI therapy REPEAT EXAM:  eSigned:  Inda Castle, MD 03/17/2012 4:16 PM   CC:

## 2012-03-17 NOTE — Progress Notes (Signed)
Pt's blood sugar was checked on admission and it was 56.  It was rechecked in a couple of minutes and it was 59.  Ritta Slot, RN report to Dr. Deatra Ina at 15:12.  Dr. Deatra Ina ordered 25 ml D50 and hang d5w IV.  D50 25 ml IV pushed over 5 minutes by Ritta Slot, RN.  Sugar rechecked 15:37 and it was 95.  Ritta Slot, RN said she will leave D5W running at [facility].  Maw

## 2012-03-17 NOTE — Progress Notes (Signed)
Patient did not have preoperative order for IV antibiotic SSI prophylaxis. (G8918)   

## 2012-03-17 NOTE — Progress Notes (Signed)
Lidocaine-40mg IV prior to Propofol InductionPropofol given over incremental dosages 

## 2012-03-17 NOTE — Progress Notes (Signed)
The pt had a long list of medications and was a poor historian on when she took all these medications. Maw

## 2012-03-18 ENCOUNTER — Telehealth: Payer: Self-pay

## 2012-03-18 DIAGNOSIS — E119 Type 2 diabetes mellitus without complications: Secondary | ICD-10-CM | POA: Diagnosis not present

## 2012-03-18 NOTE — Telephone Encounter (Signed)
  Follow up Call-  Call back number 03/17/2012 11/27/2010 07/23/2010 04/19/2010  Post procedure Call Back phone  # [phone] hm [phone] ok to leave a message [phone] home [phone]  Permission to leave phone message Yes - - -     Patient questions:  Do you have a fever, pain , or abdominal swelling? no Pain Score  0 *  Have you tolerated food without any problems? yes  Have you been able to return to your normal activities? yes  Do you have any questions about your discharge instructions: Diet   no Medications  no Follow up visit  no  Do you have questions or concerns about your Care? no  Actions: * If pain score is 4 or above: No action needed, pain <4.

## 2012-04-06 ENCOUNTER — Telehealth: Payer: Self-pay | Admitting: Pulmonary Disease

## 2012-04-06 MED ORDER — AMOXICILLIN-POT CLAVULANATE 875-125 MG PO TABS: 1.0000 | ORAL_TABLET | Freq: Two times a day (BID) | ORAL | Status: DC

## 2012-04-06 NOTE — Telephone Encounter (Signed)
Spoke with pt and notified of recs per MW She verbalized understanding and denies any questions Rx was sent to [facility]

## 2012-04-06 NOTE — Telephone Encounter (Signed)
rec repeat Dr Juanetta Gosling last rx from 11/2011> one week of augmentin 875 bid. Advise her to call and take an additional week if she is not better. She is to continue sinus regimen and flonase.

## 2012-04-06 NOTE — Telephone Encounter (Signed)
Called, spoke with pt.  Reports having "mucus stuck in my throat" x 1 month.  Also states she is coughing with green mucus, has PND, increased DOE, wheezing at times, and chest tightness.  Tried mucinex q6h but not helping.  D/t the weather, she would like something called in.  As Dr. Halford Chessman is off, will route msg to doc of the day.  Dr. Melvyn Novas, pls advise.  Thank you.  Last OV with Dr. Halford Chessman: 12/04/11; was asked to f/u in 6 months  [facility]  Allergies verified with pt: Allergies  Allergen Reactions  . Promethazine Hcl     REACTION: lethargy

## 2012-04-13 ENCOUNTER — Telehealth: Payer: Self-pay | Admitting: Endocrinology

## 2012-04-13 DIAGNOSIS — N2581 Secondary hyperparathyroidism of renal origin: Secondary | ICD-10-CM | POA: Diagnosis not present

## 2012-04-13 NOTE — Telephone Encounter (Signed)
LM for pt to call for appt w/ Dr. Loanne Drilling. Re: [facility] has sent paperwork to be completed, Dr. Loanne Drilling needs to see the patient before he can sign the report. Currently awaiting a return call / Sherri S.

## 2012-04-16 DIAGNOSIS — Z760 Encounter for issue of repeat prescription: Secondary | ICD-10-CM | POA: Diagnosis not present

## 2012-04-16 DIAGNOSIS — E785 Hyperlipidemia, unspecified: Secondary | ICD-10-CM | POA: Diagnosis not present

## 2012-04-16 DIAGNOSIS — I10 Essential (primary) hypertension: Secondary | ICD-10-CM | POA: Diagnosis not present

## 2012-04-16 DIAGNOSIS — E1129 Type 2 diabetes mellitus with other diabetic kidney complication: Secondary | ICD-10-CM | POA: Diagnosis not present

## 2012-04-22 DIAGNOSIS — D649 Anemia, unspecified: Secondary | ICD-10-CM | POA: Diagnosis not present

## 2012-04-22 DIAGNOSIS — N2581 Secondary hyperparathyroidism of renal origin: Secondary | ICD-10-CM | POA: Diagnosis not present

## 2012-04-22 DIAGNOSIS — I129 Hypertensive chronic kidney disease with stage 1 through stage 4 chronic kidney disease, or unspecified chronic kidney disease: Secondary | ICD-10-CM | POA: Diagnosis not present

## 2012-04-29 ENCOUNTER — Encounter: Payer: Self-pay | Admitting: Endocrinology

## 2012-04-29 ENCOUNTER — Ambulatory Visit (INDEPENDENT_AMBULATORY_CARE_PROVIDER_SITE_OTHER): Payer: Medicare Other | Admitting: Endocrinology

## 2012-04-29 VITALS — BP 138/74 | HR 90 | Wt 277.0 lb

## 2012-04-29 DIAGNOSIS — E119 Type 2 diabetes mellitus without complications: Secondary | ICD-10-CM

## 2012-04-29 DIAGNOSIS — E1029 Type 1 diabetes mellitus with other diabetic kidney complication: Secondary | ICD-10-CM

## 2012-04-29 DIAGNOSIS — E1169 Type 2 diabetes mellitus with other specified complication: Secondary | ICD-10-CM | POA: Insufficient documentation

## 2012-04-29 LAB — HEMOGLOBIN A1C: Hgb A1c MFr Bld: 6.9 % — ABNORMAL HIGH (ref 4.6–6.5)

## 2012-04-29 LAB — MICROALBUMIN / CREATININE URINE RATIO: Microalb Creat Ratio: 4.4 mg/g (ref 0.0–30.0)

## 2012-04-29 NOTE — Patient Instructions (Addendum)
good diet and exercise habits significanly improve the control of your diabetes.  please let me know if you wish to be referred to a dietician.  high blood sugar is very risky to your health.  you should see an eye doctor every year.  You are at higher than average risk for pneumonia and hepatitis-B.  You should be vaccinated against both.   controlling your blood pressure and cholesterol drastically reduces the damage diabetes does to your body.  this also applies to quitting smoking.  please discuss these with your doctor.  you should take an aspirin every day, unless you have been advised by a doctor not to. check your blood sugar twice a day.  vary the time of day when you check, between before the 3 meals, and at bedtime.  also check if you have symptoms of your blood sugar being too high or too low.  please keep a record of the readings and bring it to your next appointment here.  please call us sooner if your blood sugar goes below 70, or if you have a lot of readings over 200.   Blood and urine tests are being requested for you today.  We'll contact you with results. Please stop taking the NPH, and increase the lantus to 40 units daily. Please come back for a follow-up appointment in1 month.

## 2012-04-29 NOTE — Progress Notes (Signed)
Subjective:    Patient ID: Joann Gutierrez, female    DOB: 10-Jun-1945, 67 y.o.   MRN: [id number]  HPI Pt returns for f/u of insulin-requiring DM (dx'ed [id number]; complicated by peripheral sensory neuropathy, gastroparesis, and renal insufficiency; therapy has been limited by pt's need for a simple regimen).   She says she takes lantus, and prn NPH (averages 5-10 units per day).  She has hypoglycemia if she takes 15 units of NPH.   Past Medical History  Diagnosis Date  . Allergy   . Depression   . Diabetes mellitus 1997    Type II   . GERD (gastroesophageal reflux disease)   . Hypertension   . Hyperlipidemia   . Osteoarthritis   . Osteoporosis   . Hair loss   . Anemia   . CAD (coronary artery disease)     Mild very minimal coronary disease with 20% obtuse marginal stenosis  . CVA (cerebral infarction)   . Diverticulosis   . Esophageal stricture   . Gastritis   . Adenomatous colon polyp   . Sickle cell trait   . Asthma     pt has cpap w/ her today  . COPD (chronic obstructive pulmonary disease)   . 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 Gastroparesis 08/21/2007  . RENAL INSUFFICIENCY 02/16/2009  . OSTEOARTHRITIS 08/09/2006  . Morbid obesity   . CPAP (continuous positive airway pressure) dependence   . CHF (congestive heart failure)   . Sickle cell anemia   . Carpal tunnel syndrome on left   . 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 Carpal tunnel syndrome, left   . Hernia, hiatal   . Seizures     pt thinks it has been several monthes since she had a seisure  . Stroke   . Hearing loss of both ears     Past Surgical History  Procedure Laterality Date  . Abdominal hysterectomy    . Replacement total knee  1998    Left  . Esophagogastroduodenoscopy  06/25/2006  . Erd  08/08/2000  . Artery biopsy  01/07/2011    Procedure: MINOR BIOPSY TEMPORAL ARTERY;  Surgeon: Haywood Lasso, MD;  Location: [facility];  Service: General;  Laterality: Left;  left temporal  artery biopsy  . Breast lumpectomy      benign  . Breast lumpectomy      both breast lumps removed   . Hernia repair    . Colonoscopy    . Upper gastrointestinal endoscopy    . Bil foot surgery      History   Social History  . Marital Status: Widowed    Spouse Name: N/A    Number of Children: 2  . Years of Education: N/A   Occupational History  . Retired    Social History Main Topics  . Smoking status: Former Smoker -- 0.50 packs/day for 10 years    Quit date: 04/18/1980  . Smokeless tobacco: Never Used  . Alcohol Use: No  . Drug Use: No  . Sexually Active: Not on file   Other Topics Concern  . Not on file   Social History Narrative   Previously worked as a Electrical engineer.   Daily Caffeine Use-Coffee and Tea   Lives with a friend who is her care giver, has home health nurse come out once a week.  She has family in town- daughter, grand daughter.     Current Outpatient Prescriptions on File Prior to Visit  Medication Sig Dispense Refill  .  albuterol (VENTOLIN HFA) 108 (90 BASE) MCG/ACT inhaler Inhale 2 puffs into the lungs every 6 (six) hours as needed for shortness of breath.  1 Inhaler  6  . amLODipine (NORVASC) 5 MG tablet       . amoxicillin-clavulanate (AUGMENTIN) 875-125 MG per tablet Take 1 tablet by mouth 2 (two) times daily.  14 tablet  0  . aspirin 81 MG tablet Take 81 mg by mouth daily.        Marland Kitchen atorvastatin (LIPITOR) 40 MG tablet Take 1 tablet (40 mg total) by mouth daily.  30 tablet  6  . budesonide-formoterol (SYMBICORT) 160-4.5 MCG/ACT inhaler Inhale 2 puffs into the lungs 2 (two) times daily.  1 Inhaler  6  . Calcium Carbonate-Vit D-Min (CALTRATE PLUS PO) Take 600 mg by mouth daily.        Marland Kitchen CARAFATE 1 GM/10ML suspension Take 10 mLs by mouth Daily.      . citalopram (CELEXA) 20 MG tablet Take 20 mg by mouth daily.        . cyclobenzaprine (FLEXERIL) 10 MG tablet Take 1 tablet (10 mg total) by mouth daily.  30 tablet  5  . diclofenac (VOLTAREN) 75 MG EC  tablet Take 1 tablet by mouth Daily.      Marland Kitchen dicyclomine (BENTYL) 10 MG capsule Take 1 tablet by mouth Three times a day before meals.      Marland Kitchen diltiazem (CARDIZEM CD) 240 MG 24 hr capsule Take 240 mg by mouth daily.        Marland Kitchen donepezil (ARICEPT) 5 MG tablet Take 10 mg by mouth daily.      . DULERA 100-5 MCG/ACT AERO       . EASY TOUCH PEN NEEDLES 31G X 5 MM MISC       . ezetimibe (ZETIA) 10 MG tablet Take 10 mg by mouth daily.        . fluticasone (FLONASE) 50 MCG/ACT nasal spray Place 2 sprays into the nose daily.  16 g  5  . furosemide (LASIX) 80 MG tablet Take 80 mg by mouth 2 (two) times daily.      Marland Kitchen gabapentin (NEURONTIN) 300 MG capsule Take 1 capsule (300 mg total) by mouth 2 (two) times daily.  60 capsule  5  . hydrALAZINE (APRESOLINE) 50 MG tablet Take 1 tablet by mouth Daily.      . Insulin Syringe-Needle U-100 (B-D INS SYR ULTRAFINE 1CC/31G) 31G X 5/16" 1 ML MISC Use as directecd to inject insulin  100 each  5  . KLOR-CON M20 20 MEQ tablet Take 1 tablet by mouth Daily.      Marland Kitchen LIDODERM 5 % 1 patch once daily      . losartan-hydrochlorothiazide (HYZAAR) 100-25 MG per tablet Take 1 tablet by mouth daily.  30 tablet  11  . metoCLOPramide (REGLAN) 10 MG tablet Take 10 mg by mouth 4 (four) times daily.        . metolazone (ZAROXOLYN) 2.5 MG tablet Take 1 tablet by mouth Daily.      . metoprolol tartrate (LOPRESSOR) 25 MG tablet Take 1 tablet (25 mg total) by mouth 2 (two) times daily.  60 tablet  11  . Multiple Vitamin (MULTIVITAMIN PO) Take 1 tablet by mouth daily.        Marland Kitchen omeprazole (PRILOSEC) 20 MG capsule Take 40 mg by mouth daily.       . raloxifene (EVISTA) 60 MG tablet Take 60 mg by mouth daily.        Marland Kitchen  SINGULAIR 10 MG tablet Take 1 tablet by mouth daily.      . TraMADol HCl 50 MG TBSO Take 1 tablet by mouth 4 (four) times daily as needed.         No current facility-administered medications on file prior to visit.    Allergies  Allergen Reactions  . Promethazine Hcl      REACTION: lethargy    Family History  Problem Relation Age of Onset  . Colon cancer Brother   . Cancer Brother     Colon Cancer  . Cancer Mother     Liver Cancer  . Diabetes Mother   . Kidney disease Mother   . Heart disease Mother     age 45's  . Heart disease Father 24    MI  . Heart attack Father     died of MI when pt was 89  . Sickle cell anemia Father   . Diabetes Sister   . Kidney disease Sister   . Heart disease Sister     age 11's  . Allergies Sister   . Diabetes Sister   . Kidney disease Sister   . Heart disease Sister     age 34's  . Esophageal cancer Neg Hx   . Rectal cancer Neg Hx   . Stomach cancer Neg Hx     BP 138/74  Pulse 90  Wt 277 lb (125.646 kg)  BMI 46.83 kg/m2  SpO2 98%  Review of Systems Denies LOC.      Objective:   Physical Exam VITAL SIGNS:  See vs page.   GENERAL: no distress. Pulses: dorsalis pedis intact bilat.   Feet: no deformity.  no ulcer on the feet.  feet are of normal color and temp.  no edema Neuro: sensation is intact to touch on the feet.       Assessment & Plan:  DM: therapy limited by noncompliance with f/u appts and cbg recording.  i'll do the best i can.

## 2012-05-05 DIAGNOSIS — D649 Anemia, unspecified: Secondary | ICD-10-CM | POA: Diagnosis not present

## 2012-05-21 DIAGNOSIS — E663 Overweight: Secondary | ICD-10-CM | POA: Diagnosis not present

## 2012-05-21 DIAGNOSIS — E1129 Type 2 diabetes mellitus with other diabetic kidney complication: Secondary | ICD-10-CM | POA: Diagnosis not present

## 2012-05-21 DIAGNOSIS — R413 Other amnesia: Secondary | ICD-10-CM | POA: Diagnosis not present

## 2012-05-21 DIAGNOSIS — I10 Essential (primary) hypertension: Secondary | ICD-10-CM | POA: Diagnosis not present

## 2012-05-21 DIAGNOSIS — E7800 Pure hypercholesterolemia, unspecified: Secondary | ICD-10-CM | POA: Diagnosis not present

## 2012-05-29 DIAGNOSIS — M1990 Unspecified osteoarthritis, unspecified site: Secondary | ICD-10-CM | POA: Diagnosis not present

## 2012-05-29 DIAGNOSIS — M2550 Pain in unspecified joint: Secondary | ICD-10-CM | POA: Diagnosis not present

## 2012-05-29 DIAGNOSIS — R700 Elevated erythrocyte sedimentation rate: Secondary | ICD-10-CM | POA: Diagnosis not present

## 2012-05-29 DIAGNOSIS — IMO0001 Reserved for inherently not codable concepts without codable children: Secondary | ICD-10-CM | POA: Diagnosis not present

## 2012-06-01 ENCOUNTER — Encounter: Payer: Self-pay | Admitting: Podiatry

## 2012-06-01 ENCOUNTER — Ambulatory Visit (INDEPENDENT_AMBULATORY_CARE_PROVIDER_SITE_OTHER): Payer: Medicare Other | Admitting: Podiatry

## 2012-06-01 VITALS — BP 155/77 | HR 69 | Ht 64.5 in | Wt 280.0 lb

## 2012-06-01 DIAGNOSIS — M79673 Pain in unspecified foot: Secondary | ICD-10-CM

## 2012-06-01 DIAGNOSIS — M79609 Pain in unspecified limb: Secondary | ICD-10-CM | POA: Diagnosis not present

## 2012-06-01 DIAGNOSIS — B351 Tinea unguium: Secondary | ICD-10-CM | POA: Diagnosis not present

## 2012-06-01 DIAGNOSIS — M25579 Pain in unspecified ankle and joints of unspecified foot: Secondary | ICD-10-CM | POA: Insufficient documentation

## 2012-06-01 NOTE — Progress Notes (Signed)
Subjective: 67 y.o. year old female patient presents complaining of painful nails. Patient requests toe nails, corns and calluses trimmed. Patient is Type II IDDM, and stated that this morning blood sugar was 202 before she took Insulin.   Patient Summary List & History reviewed for allergies, medications, medical problems and surgical history.  Review of Systems - General ROS: negative for - chills, fatigue, fever, hot flashes, night sweats or weight loss Ophthalmic ROS: negative ENT ROS: negative Hematological and Lymphatic ROS: Anemic, and takes Iron pills. Endocrine ROS: negative Breast ROS: negative for breast lumps Respiratory ROS: no cough, shortness of breath, or wheezing Cardiovascular ROS: Has had congestive heart failure last year.  Gastrointestinal ROS: no abdominal pain, change in bowel habits, or black or bloody stools Musculoskeletal ROS: Occasional pain in joints of wrist, elbow and knees. Neurological ROS: no TIA or stroke symptoms Dermatological ROS: negative  Objective: Dermatologic:  Hypertrophic nails x 10. No plantar lesions, no abnormal skin lesions noted. Old longitudinal incision scar over the area of 2nd Cuneiform dorsum bilateral. (Patient does not know the type of surgery done)  Vascular: All pedal pulses are palpable bilateral. No pedal edema noted.  Orthopedic:  Mild hallux valgus with bunion on left foot. Mild ankle equinus left.  Neurologic:  Shown positive normal response to Monofilament sensory testing. All epicritic and tactile sensations grossly intact.  Assessment: Dystrophic mycotic nails x 10. IDDM Painful feet.   Treatment: All mycotic nails debrided.  Return in 3 months or as needed.

## 2012-06-24 DIAGNOSIS — E1129 Type 2 diabetes mellitus with other diabetic kidney complication: Secondary | ICD-10-CM | POA: Diagnosis not present

## 2012-06-24 DIAGNOSIS — R413 Other amnesia: Secondary | ICD-10-CM | POA: Diagnosis not present

## 2012-06-24 DIAGNOSIS — E7800 Pure hypercholesterolemia, unspecified: Secondary | ICD-10-CM | POA: Diagnosis not present

## 2012-06-24 DIAGNOSIS — I10 Essential (primary) hypertension: Secondary | ICD-10-CM | POA: Diagnosis not present

## 2012-07-07 ENCOUNTER — Other Ambulatory Visit: Payer: Self-pay | Admitting: Endocrinology

## 2012-07-07 ENCOUNTER — Telehealth: Payer: Self-pay | Admitting: Gastroenterology

## 2012-07-07 ENCOUNTER — Telehealth: Payer: Self-pay | Admitting: Pulmonary Disease

## 2012-07-07 MED ORDER — MONTELUKAST SODIUM 10 MG PO TABS: 10.0000 mg | ORAL_TABLET | Freq: Every day | ORAL | Status: DC

## 2012-07-07 MED ORDER — SUCRALFATE 1 GM/10ML PO SUSP: 1.0000 g | Freq: Two times a day (BID) | ORAL | Status: DC

## 2012-07-07 MED ORDER — METOCLOPRAMIDE HCL 10 MG PO TABS: 10.0000 mg | ORAL_TABLET | Freq: Four times a day (QID) | ORAL | Status: DC

## 2012-07-07 MED ORDER — OMEPRAZOLE 20 MG PO CPDR: 40.0000 mg | DELAYED_RELEASE_CAPSULE | Freq: Every day | ORAL | Status: DC

## 2012-07-07 MED ORDER — ALBUTEROL SULFATE HFA 108 (90 BASE) MCG/ACT IN AERS: 2.0000 | INHALATION_SPRAY | Freq: Four times a day (QID) | RESPIRATORY_TRACT | Status: DC | PRN

## 2012-07-07 MED ORDER — BUDESONIDE-FORMOTEROL FUMARATE 160-4.5 MCG/ACT IN AERO: 2.0000 | INHALATION_SPRAY | Freq: Two times a day (BID) | RESPIRATORY_TRACT | Status: DC

## 2012-07-07 NOTE — Telephone Encounter (Signed)
Medications sent to pharmacy... Spoke with patient

## 2012-07-07 NOTE — Telephone Encounter (Signed)
Spoke with pt requesting rx's be sent to [facility] .  Symbicort,singular,and albuterol rx's sent for these. i do not see rx for dulera on current med list. Dr Halford Chessman please advise Thank ou

## 2012-07-07 NOTE — Telephone Encounter (Signed)
[email or web address].Jennifer Castillo, CMA  

## 2012-07-07 NOTE — Telephone Encounter (Signed)
Returning call can be reached at [phone].Elnita Maxwell

## 2012-07-08 NOTE — Telephone Encounter (Signed)
Pt aware of VS recs. She voiced her understanding and needed nothing further

## 2012-07-08 NOTE — Telephone Encounter (Signed)
She has been on symbicort.  She does not need to be on symbicort and dulera at the same time.  She is to continue symbicort.

## 2012-07-16 ENCOUNTER — Telehealth: Payer: Self-pay | Admitting: Pulmonary Disease

## 2012-07-16 NOTE — Telephone Encounter (Signed)
Spoke with patient, Patient requesting to be seen earlier than 7/25 Re-Scheduled for 7/9 @ [id number] w TP for o2 recert

## 2012-07-21 DIAGNOSIS — E1129 Type 2 diabetes mellitus with other diabetic kidney complication: Secondary | ICD-10-CM | POA: Diagnosis not present

## 2012-07-21 DIAGNOSIS — E1165 Type 2 diabetes mellitus with hyperglycemia: Secondary | ICD-10-CM | POA: Diagnosis not present

## 2012-07-22 DIAGNOSIS — E1129 Type 2 diabetes mellitus with other diabetic kidney complication: Secondary | ICD-10-CM | POA: Diagnosis not present

## 2012-07-22 DIAGNOSIS — I10 Essential (primary) hypertension: Secondary | ICD-10-CM | POA: Diagnosis not present

## 2012-07-29 ENCOUNTER — Encounter: Payer: Self-pay | Admitting: Adult Health

## 2012-07-29 ENCOUNTER — Ambulatory Visit (INDEPENDENT_AMBULATORY_CARE_PROVIDER_SITE_OTHER): Payer: Medicare Other | Admitting: Adult Health

## 2012-07-29 VITALS — BP 142/80 | HR 74 | Temp 98.7°F | Ht 64.5 in | Wt 286.0 lb

## 2012-07-29 DIAGNOSIS — G4733 Obstructive sleep apnea (adult) (pediatric): Secondary | ICD-10-CM

## 2012-07-29 DIAGNOSIS — J449 Chronic obstructive pulmonary disease, unspecified: Secondary | ICD-10-CM

## 2012-07-29 MED ORDER — AZITHROMYCIN 250 MG PO TABS: ORAL_TABLET | ORAL | Status: AC

## 2012-07-29 NOTE — Assessment & Plan Note (Signed)
Mild flare with URI   Plan  Zpack take as directed  Mucinex DM Twice daily  As needed  Cough/congesiton  Fluids and rest  Take Symbicort 2 puffs Twice daily   Stop Dulera .    Please contact office for sooner follow up if symptoms do not improve or worsen or seek emergency care  follow up Dr. Halford Chessman  In 3 months and As needed

## 2012-07-29 NOTE — Assessment & Plan Note (Addendum)
OSA with nocturnal desats depsite CPAP  Check ONO on CPAP to requalify for O2 for DME Cont on CPAP At bedtime   Wt loss encouraged

## 2012-07-29 NOTE — Progress Notes (Signed)
  Subjective:    Patient ID: Joann Gutierrez, female    DOB: [birth year], 67 y.o.   MRN: [id number]  HPI 67 yo female former smoker, with dyspnea with asthma, rhinitis with post-nasal drip, obesity, OSA on CPAP 9 cm and 2nd pulmonary hypertension   07/29/2012 Follow up and O2 recert  Returns for follow up for chronic asthma and OSA Wears CPAP At bedtime  With O2 at 2 l/m all night.  DME requires ONO for O2 recert.  Taking both dulera and symbicort . Advised to take only symbicort.  No ER or hospital visit.  Complains of increased SOB, wheezing, tightness in chest, prod cough with yellow mucus, x2 weeks.   No fever or hemotpysis  No otc used.  No increased leg swelling.   Cough is not getting better, getting up thick yellow mucus    Review of Systems  Constitutional:   No  weight loss, night sweats,  Fevers, chills, + fatigue, or  lassitude.  HEENT:   No headaches,  Difficulty swallowing,  Tooth/dental problems, or  Sore throat,                No sneezing, itching, ear ache,  +nasal congestion, post nasal drip,   CV:  No chest pain,  Orthopnea, PND, swelling in lower extremities, anasarca, dizziness, palpitations, syncope.   GI  No heartburn, indigestion, abdominal pain, nausea, vomiting, diarrhea, change in bowel habits, loss of appetite, bloody stools.   Resp:       No coughing up of blood.     No chest wall deformity  Skin: no rash or lesions.  GU: no dysuria, change in color of urine, no urgency or frequency.  No flank pain, no hematuria   MS:  No joint pain or swelling.  No decreased range of motion.  No back pain.  Psych:  No change in mood or affect. No depression or anxiety.  No memory loss.         Objective:   Physical Exam  GEN: A/Ox3; pleasant , NAD, obese   HEENT:  [place]/AT,  EACs-clear, TMs-wnl, NOSE-clear, THROAT-clear, no lesions, no postnasal drip or exudate noted.   NECK:  Supple w/ fair ROM; no JVD; normal carotid impulses w/o bruits; no  thyromegaly or nodules palpated; no lymphadenopathy.  RESP  Coarse BS  w/o, wheezes/ rales/ or rhonchi.no accessory muscle use, no dullness to percussion  CARD:  RRR, no m/r/g  + tr  peripheral edema, pulses intact, no cyanosis or clubbing.  GI:   Soft & nt; nml bowel sounds; no organomegaly or masses detected.  Musco: Warm bil, no deformities or joint swelling noted.   Neuro: alert, no focal deficits noted.    Skin: Warm, no lesions or rashes         Assessment & Plan:

## 2012-07-29 NOTE — Progress Notes (Signed)
Reviewed and agree with assessment/plan. 

## 2012-07-29 NOTE — Patient Instructions (Addendum)
Zpack take as directed  Mucinex DM Twice daily  As needed  Cough/congesiton  Fluids and rest  Take Symbicort 2 puffs Twice daily   Stop Dulera .  We are setting you up for ONO to check Oxygen while you sleep.  Please contact office for sooner follow up if symptoms do not improve or worsen or seek emergency care  follow up Dr. Halford Chessman  In 3 months and As needed

## 2012-07-30 NOTE — Addendum Note (Signed)
Addended by: Parke Poisson E on: 07/30/2012 10:17 AM   Modules accepted: Orders

## 2012-08-12 IMAGING — CR DG CHEST 1V PORT
1 series · 1 of 1 positions shown · non-contrast
Comparison: 05/18/2011

CLINICAL DATA: Pneumonia.  Shortness of breath

PORTABLE CHEST - 1 VIEW

[AP]
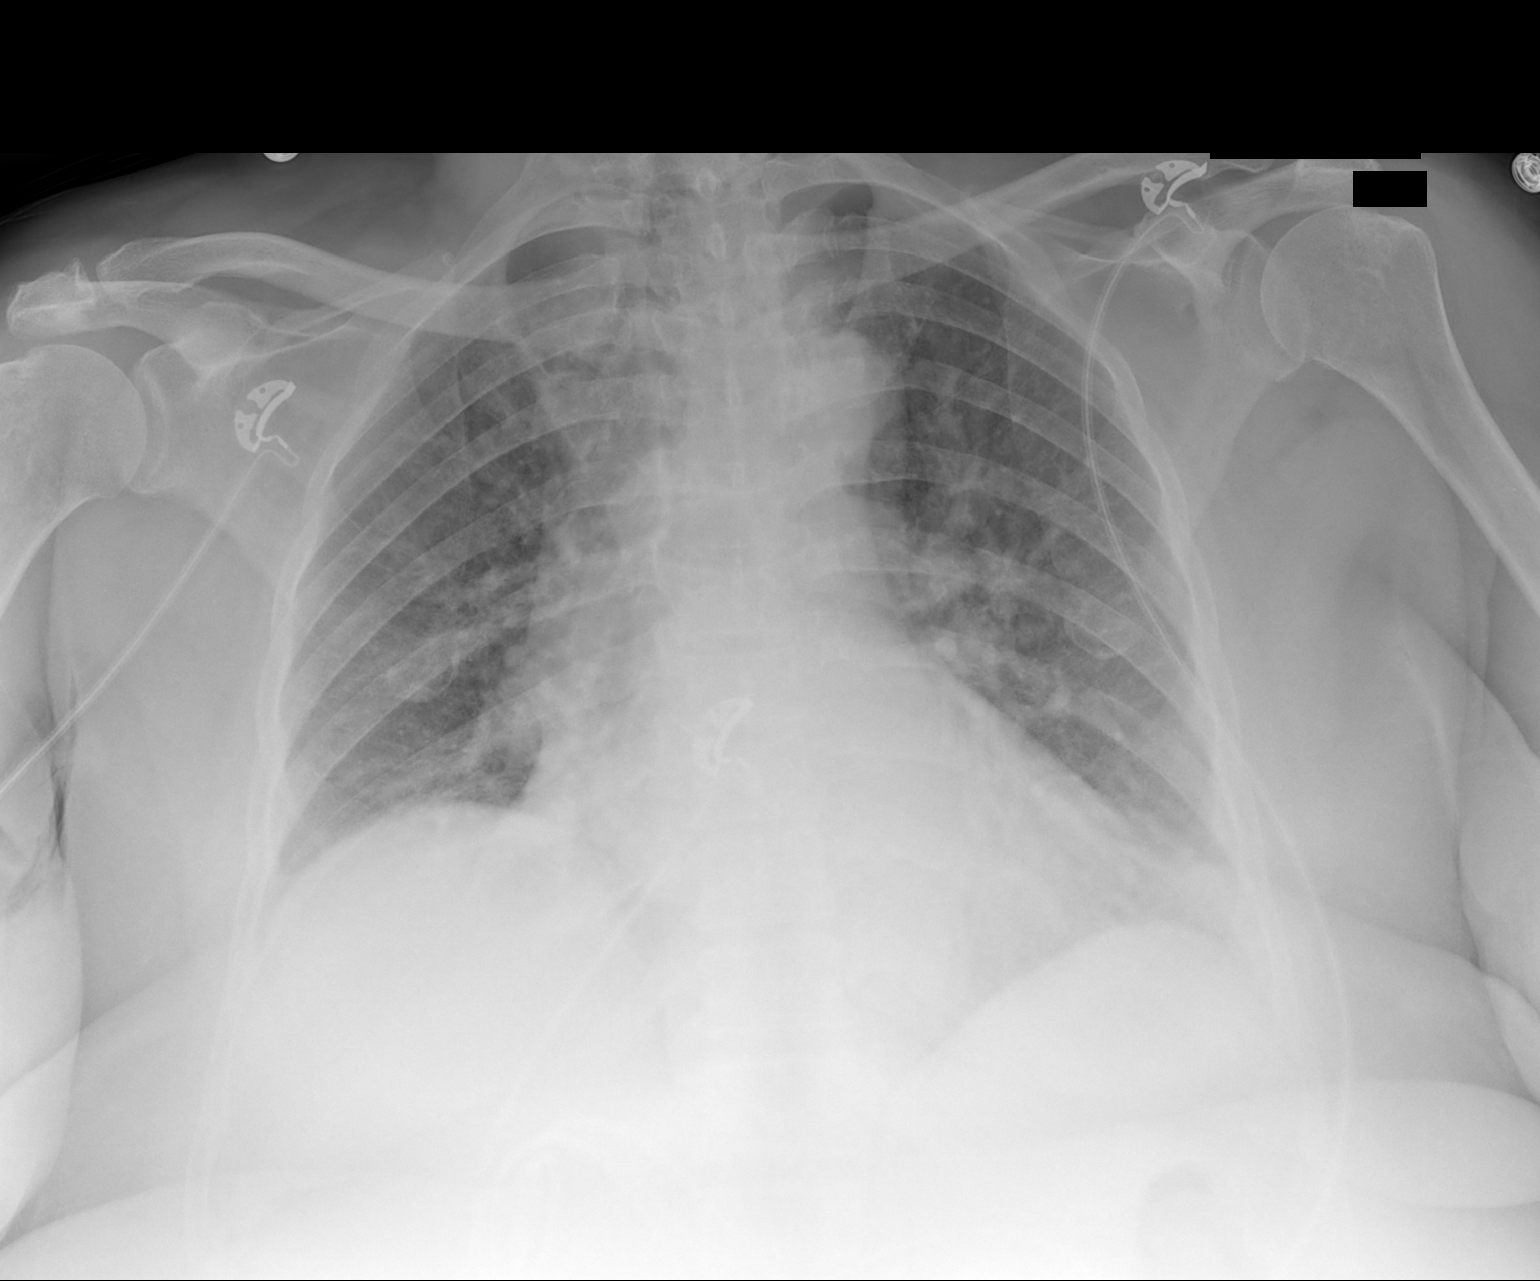

[1 of 1 positions shown; findings below may reference images not displayed]

FINDINGS: Heart size appears mildly enlarged.

No pleural effusion or edema identified.

There is no airspace consolidation identified.

Lung volumes are low and there is slight asymmetric elevation of
the right hemidiaphragm.
IMPRESSION: 1.  Mild cardiac enlargement.
2.  Low lung volumes and asymmetric elevation of the right
hemidiaphragm.

## 2012-08-14 ENCOUNTER — Ambulatory Visit: Payer: Medicare Other | Admitting: Pulmonary Disease

## 2012-08-14 IMAGING — US US RENAL
1 series · 14 of 25 positions shown · non-contrast
Comparison: 11/12/2009

CLINICAL DATA: Acute on chronic renal failure

RENAL/URINARY TRACT ULTRASOUND
TECHNIQUE: Renal ultrasound

[Series 1: us renal · 0.30mm/px · 14 of 33 slices shown]
[im 1/33]
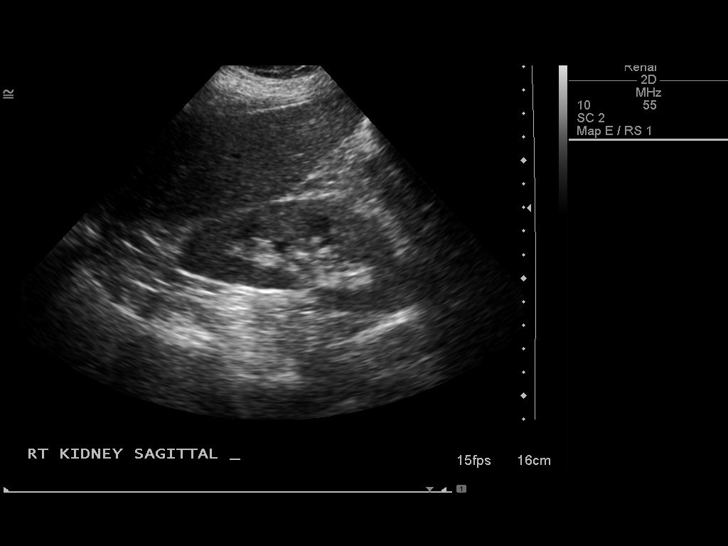
[im 3/33]
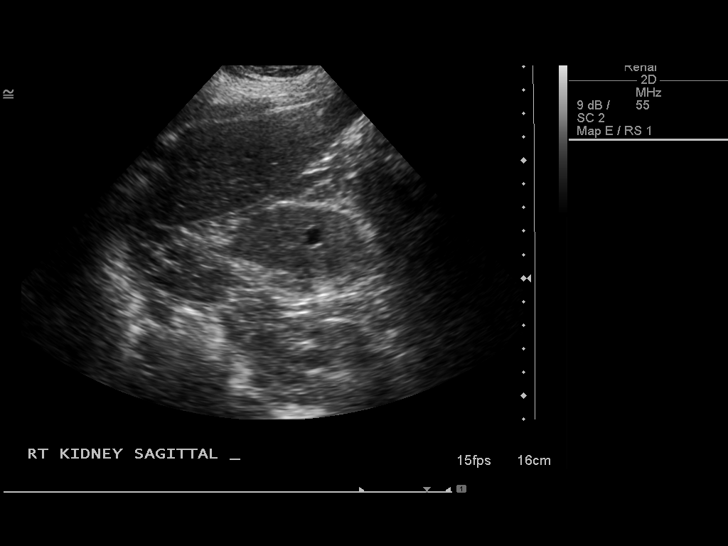
[im 6/33]
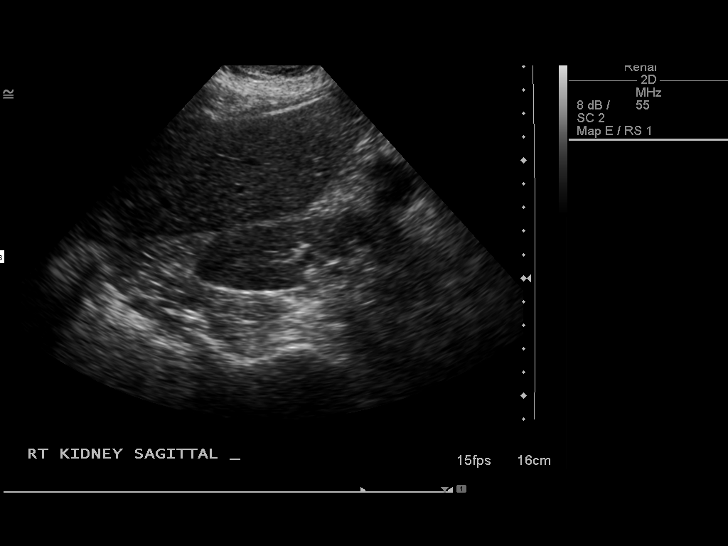
[im 9/33]
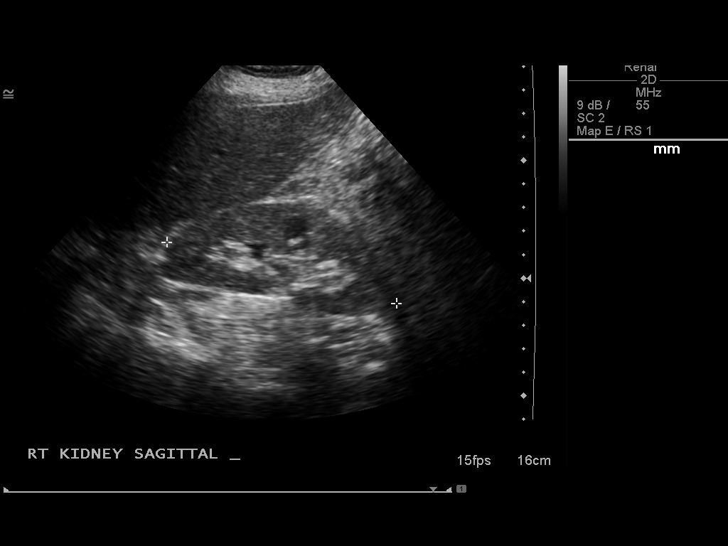
[im 11/33]
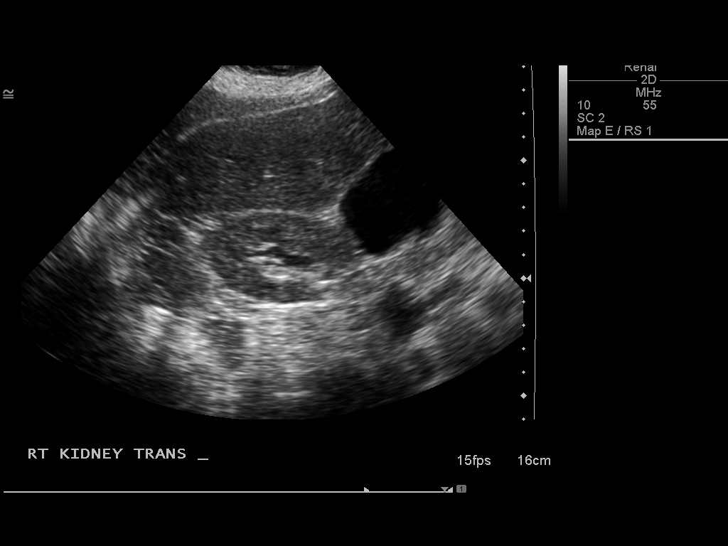
[im 13/33]
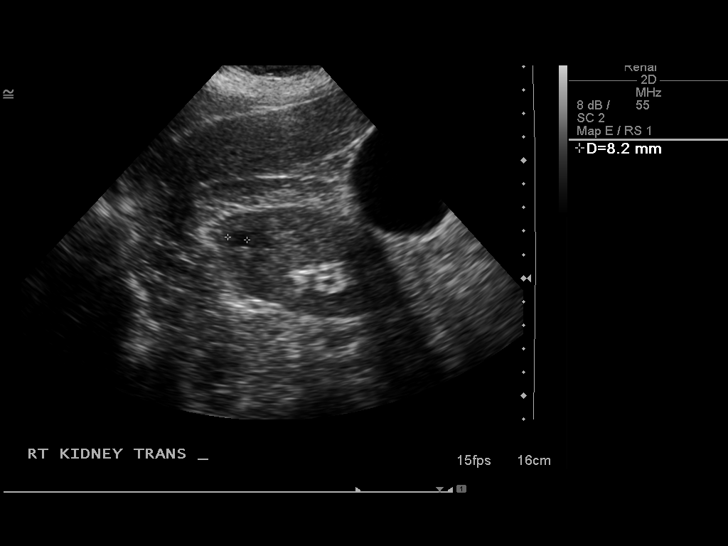
[im 15/33]
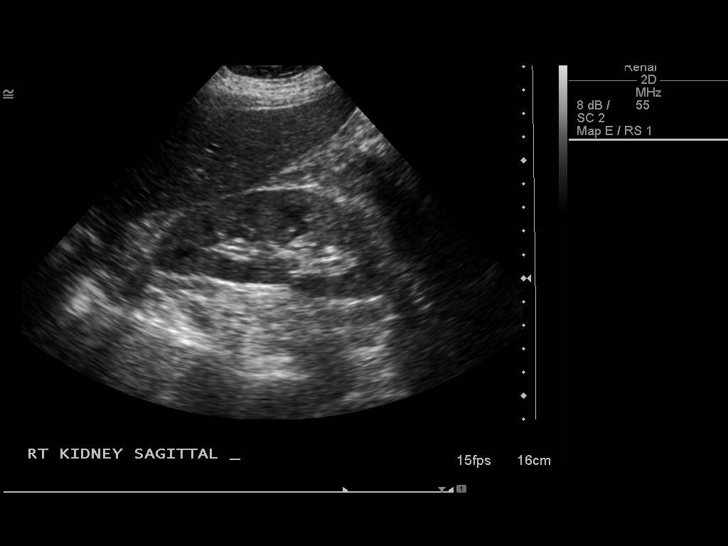
[im 18/33]
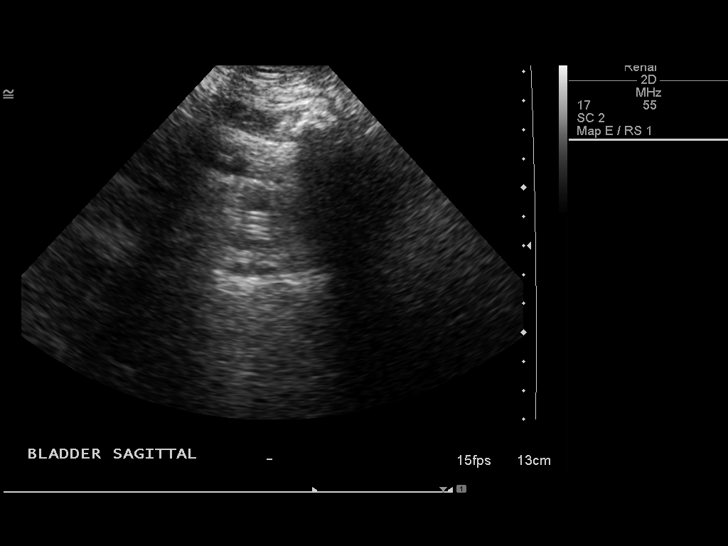
[im 21/33]
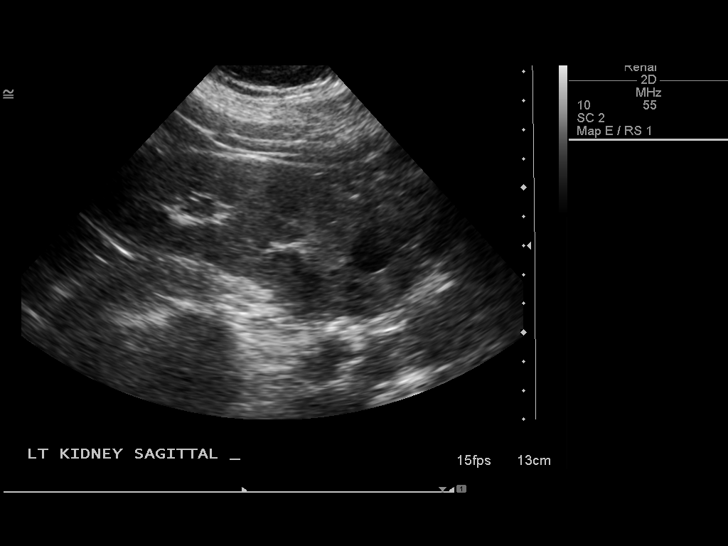
[im 22/33]
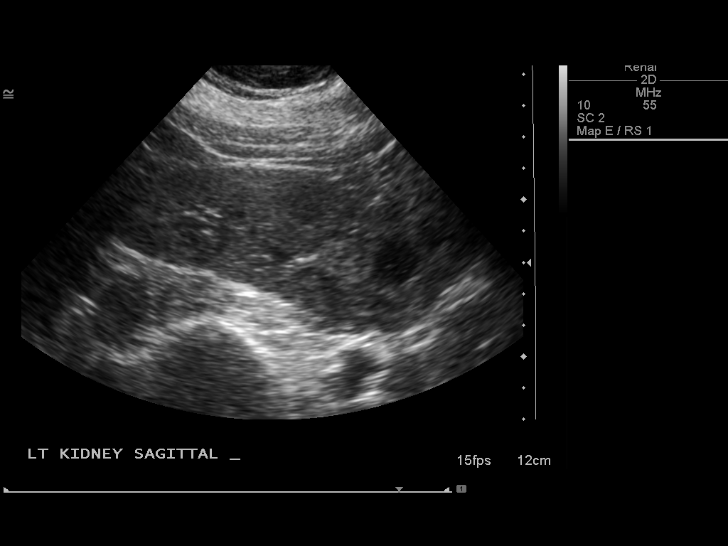
[im 25/33]
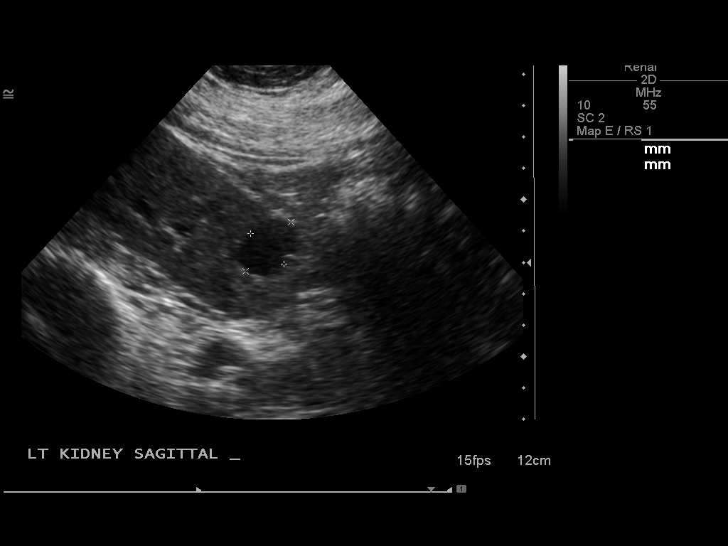
[im 27/33]
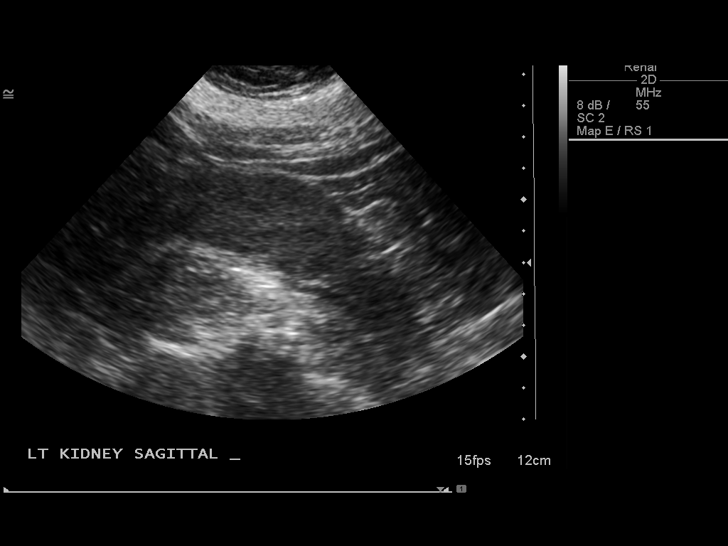
[im 30/33]
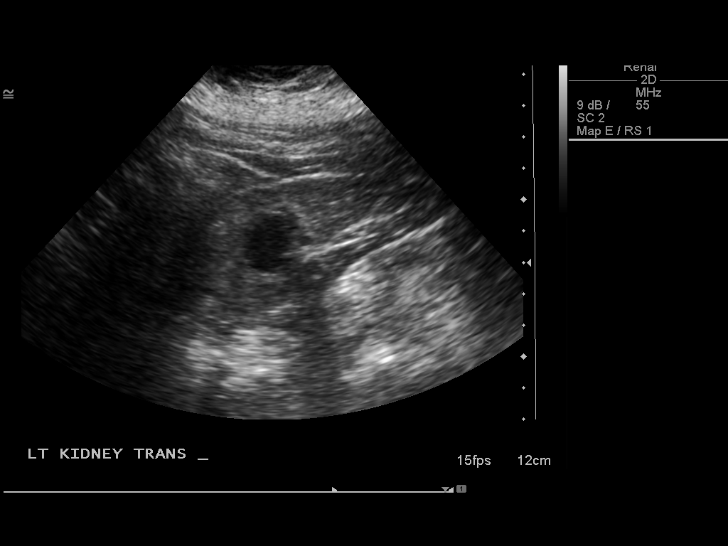
[im 33/33]
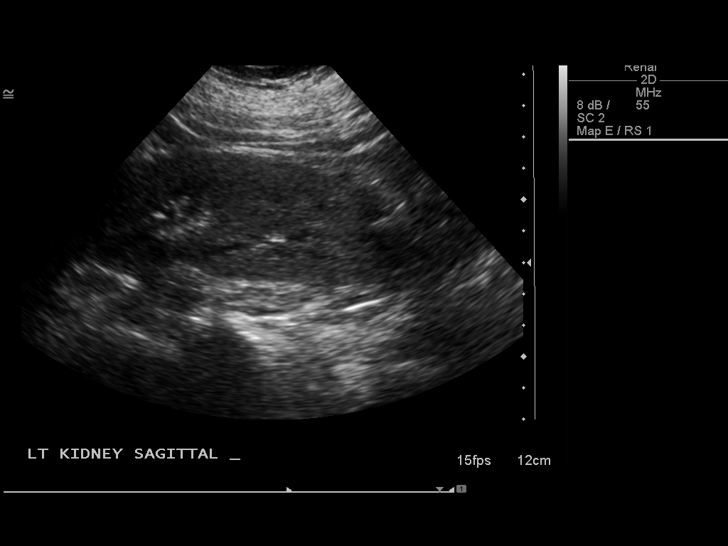

[14 of 25 positions shown; findings below may reference images not displayed]

FINDINGS: Right kidney measures 10 cm in length.  No hydronephrosis
or diagnostic renal calculus.  A mid pole cyst measures 8 x 9 mm.

Left kidney measures 10.3 cm in length.  No hydronephrosis or
diagnostic renal calculus.  A lower pole cyst measures 2.1 x
cm.

The urinary bladder is decompressed with Foley catheter.
IMPRESSION: 1.  No hydronephrosis or diagnostic renal calculus.  Small cyst is
noted mid pole of the right kidney.  A cyst in lower pole of the
left kidney measures 2.1 x 1.6 cm.
2.  Foley catheter in a decompressed urinary bladder.

## 2012-08-26 ENCOUNTER — Other Ambulatory Visit: Payer: Self-pay

## 2012-08-31 ENCOUNTER — Ambulatory Visit (INDEPENDENT_AMBULATORY_CARE_PROVIDER_SITE_OTHER): Payer: Medicare Other | Admitting: Gastroenterology

## 2012-08-31 ENCOUNTER — Encounter: Payer: Self-pay | Admitting: Gastroenterology

## 2012-08-31 VITALS — BP 134/60 | HR 68 | Ht 64.5 in | Wt 286.0 lb

## 2012-08-31 DIAGNOSIS — D649 Anemia, unspecified: Secondary | ICD-10-CM | POA: Diagnosis not present

## 2012-08-31 DIAGNOSIS — K222 Esophageal obstruction: Secondary | ICD-10-CM | POA: Insufficient documentation

## 2012-08-31 DIAGNOSIS — K3184 Gastroparesis: Secondary | ICD-10-CM | POA: Diagnosis not present

## 2012-08-31 DIAGNOSIS — R700 Elevated erythrocyte sedimentation rate: Secondary | ICD-10-CM | POA: Diagnosis not present

## 2012-08-31 DIAGNOSIS — M1990 Unspecified osteoarthritis, unspecified site: Secondary | ICD-10-CM | POA: Diagnosis not present

## 2012-08-31 DIAGNOSIS — R1319 Other dysphagia: Secondary | ICD-10-CM | POA: Diagnosis not present

## 2012-08-31 DIAGNOSIS — M2550 Pain in unspecified joint: Secondary | ICD-10-CM | POA: Diagnosis not present

## 2012-08-31 DIAGNOSIS — N2581 Secondary hyperparathyroidism of renal origin: Secondary | ICD-10-CM | POA: Diagnosis not present

## 2012-08-31 DIAGNOSIS — IMO0001 Reserved for inherently not codable concepts without codable children: Secondary | ICD-10-CM | POA: Diagnosis not present

## 2012-08-31 MED ORDER — OMEPRAZOLE 20 MG PO CPDR: 40.0000 mg | DELAYED_RELEASE_CAPSULE | Freq: Every day | ORAL | Status: DC

## 2012-08-31 NOTE — Progress Notes (Signed)
History of Present Illness:  The patient has returned for medication renewal.  She takes omeprazole chronically.  She no longer takes dicyclomine.  Her main complaint is intermittent nausea.  She has well-established gastroparesis for which she is on Reglan.  Dysphagia has subsided since her last esophageal dilatation.    Review of Systems: Pertinent positive and negative review of systems were noted in the above HPI section. All other review of systems were otherwise negative.    Current Medications, Allergies, Past Medical History, Past Surgical History, Family History and Social History were reviewed in [facility] record  Vital signs were reviewed in today's medical record. Physical Exam: General: Well developed , well nourished, no acute distress

## 2012-08-31 NOTE — Assessment & Plan Note (Signed)
Plan repeat dilatation as needed

## 2012-08-31 NOTE — Assessment & Plan Note (Signed)
Resolved following esophageal dilatation. 

## 2012-08-31 NOTE — Patient Instructions (Addendum)
Discontinue dicyclomine We will send omeprazole to your pharmacy

## 2012-08-31 NOTE — Assessment & Plan Note (Signed)
She's had a moderate response to Reglan.  Will add a gastroparesis diet.

## 2012-09-01 ENCOUNTER — Ambulatory Visit (INDEPENDENT_AMBULATORY_CARE_PROVIDER_SITE_OTHER): Payer: Medicare Other | Admitting: Podiatry

## 2012-09-01 ENCOUNTER — Encounter: Payer: Self-pay | Admitting: Podiatry

## 2012-09-01 VITALS — BP 167/68 | HR 84 | Ht 64.5 in | Wt 286.0 lb

## 2012-09-01 DIAGNOSIS — B351 Tinea unguium: Secondary | ICD-10-CM | POA: Diagnosis not present

## 2012-09-01 DIAGNOSIS — M79609 Pain in unspecified limb: Secondary | ICD-10-CM

## 2012-09-01 DIAGNOSIS — M79673 Pain in unspecified foot: Secondary | ICD-10-CM

## 2012-09-01 DIAGNOSIS — E119 Type 2 diabetes mellitus without complications: Secondary | ICD-10-CM

## 2012-09-01 NOTE — Progress Notes (Signed)
Patient requests toe nails trimmed. Her blood sugar was 168 this morning.   Objective: Hypertrophic nails x 10. Bilateral ankle and pedal edema. Neurovascular status are within normal. No gross osseous deformities.  Assessment: Onychomycosis x 10. IDDM under control.  Plan:  Debrided all nails x 10.

## 2012-09-11 DIAGNOSIS — I10 Essential (primary) hypertension: Secondary | ICD-10-CM | POA: Diagnosis not present

## 2012-09-11 DIAGNOSIS — N2581 Secondary hyperparathyroidism of renal origin: Secondary | ICD-10-CM | POA: Diagnosis not present

## 2012-09-11 DIAGNOSIS — D649 Anemia, unspecified: Secondary | ICD-10-CM | POA: Diagnosis not present

## 2012-09-11 DIAGNOSIS — I129 Hypertensive chronic kidney disease with stage 1 through stage 4 chronic kidney disease, or unspecified chronic kidney disease: Secondary | ICD-10-CM | POA: Diagnosis not present

## 2012-10-07 DIAGNOSIS — M1990 Unspecified osteoarthritis, unspecified site: Secondary | ICD-10-CM | POA: Diagnosis not present

## 2012-10-07 DIAGNOSIS — G5600 Carpal tunnel syndrome, unspecified upper limb: Secondary | ICD-10-CM | POA: Diagnosis not present

## 2012-10-07 DIAGNOSIS — M064 Inflammatory polyarthropathy: Secondary | ICD-10-CM | POA: Diagnosis not present

## 2012-10-07 DIAGNOSIS — IMO0001 Reserved for inherently not codable concepts without codable children: Secondary | ICD-10-CM | POA: Diagnosis not present

## 2012-10-16 ENCOUNTER — Telehealth: Payer: Self-pay | Admitting: Pulmonary Disease

## 2012-10-16 NOTE — Telephone Encounter (Signed)
Pt states she has been having some increase in cough and SOB and is requesting an appt for early nest week. Appt set for Monday at 9:45. [place] Bing, CMA

## 2012-10-19 ENCOUNTER — Ambulatory Visit (INDEPENDENT_AMBULATORY_CARE_PROVIDER_SITE_OTHER)
Admission: RE | Admit: 2012-10-19 | Discharge: 2012-10-19 | Disposition: A | Discharge status: Home / Self Care | Payer: Medicare Other | Source: Ambulatory Visit | Attending: Adult Health | Admitting: Adult Health

## 2012-10-19 ENCOUNTER — Encounter: Payer: Self-pay | Admitting: Adult Health

## 2012-10-19 ENCOUNTER — Ambulatory Visit (INDEPENDENT_AMBULATORY_CARE_PROVIDER_SITE_OTHER): Payer: Medicare Other | Admitting: Adult Health

## 2012-10-19 VITALS — BP 126/62 | HR 70 | Temp 98.0°F | Ht 64.5 in | Wt 285.2 lb

## 2012-10-19 DIAGNOSIS — G4733 Obstructive sleep apnea (adult) (pediatric): Secondary | ICD-10-CM | POA: Diagnosis not present

## 2012-10-19 DIAGNOSIS — J209 Acute bronchitis, unspecified: Secondary | ICD-10-CM

## 2012-10-19 DIAGNOSIS — R05 Cough: Secondary | ICD-10-CM | POA: Diagnosis not present

## 2012-10-19 MED ORDER — CEFDINIR 300 MG PO CAPS: 300.0000 mg | ORAL_CAPSULE | Freq: Two times a day (BID) | ORAL | Status: DC

## 2012-10-19 NOTE — Patient Instructions (Addendum)
Omnicef 300mg  Twice daily  For 7 days   Mucinex DM Twice daily  As needed  Cough/congesiton  Fluids and rest   Please contact office for sooner follow up if symptoms do not improve or worsen or seek emergency care  follow up Dr. Halford Chessman  As planned next month  and As needed

## 2012-10-19 NOTE — Addendum Note (Signed)
Addended by: Parke Poisson E on: 10/19/2012 10:22 AM   Modules accepted: Orders

## 2012-10-19 NOTE — Progress Notes (Signed)
  Subjective:    Patient ID: Joann Gutierrez, female    DOB: [birth year], 67 y.o.   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 No recent travel or abx use.  No hemoptysis, chest pain , leg swelling, n/v/d  Or wt loss.  Taking otc cough syrup without much help.  Remains on symbicort Twice daily  .  Cough is worse at night, keeping her up Husband here with her today.    Review of Systems  Constitutional:   No  weight loss, night sweats,  Fevers, chills, + fatigue, or  lassitude.  HEENT:   No headaches,  Difficulty swallowing,  Tooth/dental problems, or  Sore throat,                No sneezing, itching, ear ache,  +nasal congestion, post nasal drip,   CV:  No chest pain,  Orthopnea, PND, swelling in lower extremities, anasarca, dizziness, palpitations, syncope.   GI  No heartburn, indigestion, abdominal pain, nausea, vomiting, diarrhea, change in bowel habits, loss of appetite, bloody stools.   Resp:       No coughing up of blood.     No chest wall deformity  Skin: no rash or lesions.  GU: no dysuria, change in color of urine, no urgency or frequency.  No flank pain, no hematuria   MS:  No joint pain or swelling.  No decreased range of motion.  No back pain.  Psych:  No change in mood or affect. No depression or anxiety.  No memory loss.         Objective:   Physical Exam  GEN: A/Ox3; pleasant , NAD, obese   HEENT:  [place]/AT,  EACs-clear, TMs-wnl, NOSE-clear, THROAT-clear, no lesions, no postnasal drip or exudate noted.   NECK:  Supple w/ fair ROM; no JVD; normal carotid impulses w/o bruits; no thyromegaly or nodules palpated; no lymphadenopathy.  RESP  CTA w/o, wheezes/ rales/ or rhonchi.no accessory  muscle use, no dullness to percussion  CARD:  RRR, no m/r/g  + tr  peripheral edema, pulses intact, no cyanosis or clubbing.  GI:   Soft & nt; nml bowel sounds; no organomegaly or masses detected.  Musco: Warm bil, no deformities or joint swelling noted.   Neuro: alert, no focal deficits noted.    Skin: Warm, no lesions or rashes         Assessment & Plan:

## 2012-10-19 NOTE — Assessment & Plan Note (Addendum)
Flare  cxr today   Plan  Omnicef 300mg  Twice daily  For 7 days   Mucinex DM Twice daily  As needed  Cough/congesiton  Fluids and rest  Please contact office for sooner follow up if symptoms do not improve or worsen or seek emergency care  follow up Dr. Halford Chessman  As planned next month  and As needed

## 2012-10-19 NOTE — Addendum Note (Signed)
Addended by: Parke Poisson E on: 10/19/2012 02:24 PM   Modules accepted: Orders

## 2012-10-19 NOTE — Progress Notes (Signed)
Dg Chest 2 View  10/19/2012   *RADIOLOGY REPORT*  Clinical Data: Shortness of breath, cough, congestion and weakness.  CHEST - 2 VIEW  Comparison: 03/03/2012.  Findings: Trachea is midline.  Heart size normal.  Lungs are somewhat low in volume with elevation of the right hemidiaphragm, as before.  Probable mild right basilar scarring.  Lungs are otherwise clear.  Minimal left apical pleural thickening, unchanged.  No pleural fluid.  IMPRESSION: No acute findings.   Original Report Authenticated By: Lorin Picket, M.D.    Reviewed and agree with assessment/plan.

## 2012-10-20 ENCOUNTER — Telehealth: Payer: Self-pay | Admitting: Adult Health

## 2012-10-20 NOTE — Progress Notes (Signed)
Quick Note:  Pt aware per 9/30 phone note ______

## 2012-10-20 NOTE — Telephone Encounter (Signed)
Per 9.29.14 xray results: Result Note    No sign of PNA    Cont w/ ov recs    Please contact office for sooner follow up if symptoms do not improve or worsen or seek emergency care      Called spoke with patient, advised of cxr results / recs as stated above.  Pt verbalized her understanding and denied any questions; will call back if symptoms do not improve or worsen.  Nothing further needed; will sign off.

## 2012-10-22 DIAGNOSIS — E1129 Type 2 diabetes mellitus with other diabetic kidney complication: Secondary | ICD-10-CM | POA: Diagnosis not present

## 2012-10-22 DIAGNOSIS — E7800 Pure hypercholesterolemia, unspecified: Secondary | ICD-10-CM | POA: Diagnosis not present

## 2012-10-22 DIAGNOSIS — I10 Essential (primary) hypertension: Secondary | ICD-10-CM | POA: Diagnosis not present

## 2012-10-22 DIAGNOSIS — R413 Other amnesia: Secondary | ICD-10-CM | POA: Diagnosis not present

## 2012-10-26 DIAGNOSIS — G4733 Obstructive sleep apnea (adult) (pediatric): Secondary | ICD-10-CM | POA: Diagnosis not present

## 2012-11-02 ENCOUNTER — Encounter: Payer: Self-pay | Admitting: Adult Health

## 2012-11-03 DIAGNOSIS — E1129 Type 2 diabetes mellitus with other diabetic kidney complication: Secondary | ICD-10-CM | POA: Diagnosis not present

## 2012-11-03 DIAGNOSIS — E785 Hyperlipidemia, unspecified: Secondary | ICD-10-CM | POA: Diagnosis not present

## 2012-11-03 DIAGNOSIS — F0280 Dementia in other diseases classified elsewhere without behavioral disturbance: Secondary | ICD-10-CM | POA: Diagnosis not present

## 2012-11-03 DIAGNOSIS — Z23 Encounter for immunization: Secondary | ICD-10-CM | POA: Diagnosis not present

## 2012-11-04 DIAGNOSIS — R5381 Other malaise: Secondary | ICD-10-CM | POA: Diagnosis not present

## 2012-11-04 DIAGNOSIS — M069 Rheumatoid arthritis, unspecified: Secondary | ICD-10-CM | POA: Diagnosis not present

## 2012-11-04 DIAGNOSIS — M2550 Pain in unspecified joint: Secondary | ICD-10-CM | POA: Diagnosis not present

## 2012-11-04 DIAGNOSIS — M1990 Unspecified osteoarthritis, unspecified site: Secondary | ICD-10-CM | POA: Diagnosis not present

## 2012-11-04 DIAGNOSIS — G5600 Carpal tunnel syndrome, unspecified upper limb: Secondary | ICD-10-CM | POA: Diagnosis not present

## 2012-11-09 ENCOUNTER — Telehealth: Payer: Self-pay | Admitting: Adult Health

## 2012-11-09 NOTE — Telephone Encounter (Signed)
10.2.14 ONO results > + desats on room air, continue O2 thru CPAP Called spoke with patient, she is aware to continue O2 at bedtime thru CPAP Nothing further needed; will sign off.

## 2012-11-11 DIAGNOSIS — H20019 Primary iridocyclitis, unspecified eye: Secondary | ICD-10-CM | POA: Diagnosis not present

## 2012-11-11 DIAGNOSIS — E1139 Type 2 diabetes mellitus with other diabetic ophthalmic complication: Secondary | ICD-10-CM | POA: Diagnosis not present

## 2012-11-11 DIAGNOSIS — E11319 Type 2 diabetes mellitus with unspecified diabetic retinopathy without macular edema: Secondary | ICD-10-CM | POA: Diagnosis not present

## 2012-11-17 DIAGNOSIS — E11319 Type 2 diabetes mellitus with unspecified diabetic retinopathy without macular edema: Secondary | ICD-10-CM | POA: Diagnosis not present

## 2012-11-17 DIAGNOSIS — E1139 Type 2 diabetes mellitus with other diabetic ophthalmic complication: Secondary | ICD-10-CM | POA: Diagnosis not present

## 2012-11-26 ENCOUNTER — Other Ambulatory Visit: Payer: Self-pay

## 2012-12-02 ENCOUNTER — Encounter: Payer: Self-pay | Admitting: Podiatry

## 2012-12-02 ENCOUNTER — Ambulatory Visit (INDEPENDENT_AMBULATORY_CARE_PROVIDER_SITE_OTHER): Payer: Medicare Other | Admitting: Podiatry

## 2012-12-02 VITALS — BP 155/64 | HR 85 | Ht 64.5 in | Wt 277.0 lb

## 2012-12-02 DIAGNOSIS — B351 Tinea unguium: Secondary | ICD-10-CM | POA: Diagnosis not present

## 2012-12-02 DIAGNOSIS — M25579 Pain in unspecified ankle and joints of unspecified foot: Secondary | ICD-10-CM

## 2012-12-02 DIAGNOSIS — M79673 Pain in unspecified foot: Secondary | ICD-10-CM

## 2012-12-02 DIAGNOSIS — M79609 Pain in unspecified limb: Secondary | ICD-10-CM

## 2012-12-02 NOTE — Patient Instructions (Signed)
Seen for hypertrophic nails. All nails debrided. May benefit from Diabetic shoes.

## 2012-12-02 NOTE — Progress Notes (Signed)
Subjective:  67 y.o. year old female patient presents complaining of painful nails. Patient requests toe nails trimmed.  Patient is wearing thin flat shoes.  Type II IDDM, and stated that her blood sugar was 121 today and 129 yesterday. Stated that this is lower than what used to be.   Review of Systems - General ROS: negative for - chills, fatigue, fever, hot flashes, night sweats or weight loss  Ophthalmic ROS: negative  ENT ROS: negative  Hematological and Lymphatic ROS: Anemic, and takes Iron pills.  Endocrine ROS: negative  Breast ROS: negative for breast lumps  Respiratory ROS: no cough, shortness of breath, or wheezing  Cardiovascular ROS: Has had congestive heart failure last year.  Gastrointestinal ROS: no abdominal pain, change in bowel habits, or black or bloody stools  Musculoskeletal ROS: Occasional pain in joints of wrist, elbow and knees.  Neurological ROS: no TIA or stroke symptoms  Dermatological ROS: negative   Objective: Dermatologic:  Hypertrophic nails x 10.  No plantar lesions, no abnormal skin lesions noted.  Vascular:  All pedal pulses are palpable bilateral.  No pedal edema noted.  Orthopedic:  Mild hallux valgus with bunion on left foot.  Positive of hyperextended great toe at IPJ bilateral.  Mild ankle equinus left.  Neurologic:  Shown positive normal response to Monofilament sensory testing.  All epicritic and tactile sensations grossly intact.   Assessment:  Dystrophic mycotic nails x 10.  IDDM. Painful feet.  Bunion and hyperextended great toes.   Treatment: All mycotic nails debrided.  May benefit from diabetic shoes to accommodate bunion and hyperextended great toes.  Return in 3 months or as needed.

## 2012-12-08 ENCOUNTER — Encounter: Payer: Self-pay | Admitting: Adult Health

## 2012-12-09 ENCOUNTER — Other Ambulatory Visit: Payer: Self-pay | Admitting: Internal Medicine

## 2012-12-09 ENCOUNTER — Ambulatory Visit
Admission: RE | Admit: 2012-12-09 | Discharge: 2012-12-09 | Disposition: A | Discharge status: Home / Self Care | Payer: Medicare Other | Source: Ambulatory Visit | Attending: Internal Medicine | Admitting: Internal Medicine

## 2012-12-09 DIAGNOSIS — R0602 Shortness of breath: Secondary | ICD-10-CM | POA: Diagnosis not present

## 2012-12-09 DIAGNOSIS — J209 Acute bronchitis, unspecified: Secondary | ICD-10-CM | POA: Diagnosis not present

## 2012-12-09 DIAGNOSIS — R05 Cough: Secondary | ICD-10-CM | POA: Diagnosis not present

## 2012-12-09 DIAGNOSIS — R079 Chest pain, unspecified: Secondary | ICD-10-CM | POA: Diagnosis not present

## 2012-12-10 DIAGNOSIS — E1139 Type 2 diabetes mellitus with other diabetic ophthalmic complication: Secondary | ICD-10-CM | POA: Diagnosis not present

## 2012-12-10 DIAGNOSIS — E11319 Type 2 diabetes mellitus with unspecified diabetic retinopathy without macular edema: Secondary | ICD-10-CM | POA: Diagnosis not present

## 2012-12-11 ENCOUNTER — Encounter: Payer: Self-pay | Admitting: Pulmonary Disease

## 2012-12-11 ENCOUNTER — Ambulatory Visit (INDEPENDENT_AMBULATORY_CARE_PROVIDER_SITE_OTHER): Payer: Medicare Other | Admitting: Pulmonary Disease

## 2012-12-11 ENCOUNTER — Other Ambulatory Visit (INDEPENDENT_AMBULATORY_CARE_PROVIDER_SITE_OTHER): Payer: Medicare Other

## 2012-12-11 VITALS — BP 124/66 | HR 80 | Ht 64.5 in | Wt 280.8 lb

## 2012-12-11 DIAGNOSIS — J309 Allergic rhinitis, unspecified: Secondary | ICD-10-CM

## 2012-12-11 DIAGNOSIS — G4733 Obstructive sleep apnea (adult) (pediatric): Secondary | ICD-10-CM

## 2012-12-11 DIAGNOSIS — J9610 Chronic respiratory failure, unspecified whether with hypoxia or hypercapnia: Secondary | ICD-10-CM

## 2012-12-11 DIAGNOSIS — J3489 Other specified disorders of nose and nasal sinuses: Secondary | ICD-10-CM

## 2012-12-11 DIAGNOSIS — J4489 Other specified chronic obstructive pulmonary disease: Secondary | ICD-10-CM

## 2012-12-11 DIAGNOSIS — J449 Chronic obstructive pulmonary disease, unspecified: Secondary | ICD-10-CM

## 2012-12-11 DIAGNOSIS — R0981 Nasal congestion: Secondary | ICD-10-CM

## 2012-12-11 LAB — CBC WITH DIFFERENTIAL/PLATELET
Basophils Absolute: 0 10*3/uL (ref 0.0–0.1)
Eosinophils Absolute: 0.1 10*3/uL (ref 0.0–0.7)
Hemoglobin: 10.2 g/dL — ABNORMAL LOW (ref 12.0–15.0)
Lymphocytes Relative: 24.2 % (ref 12.0–46.0)
Lymphs Abs: 1.9 10*3/uL (ref 0.7–4.0)
Monocytes Relative: 6 % (ref 3.0–12.0)
Neutro Abs: 5.2 10*3/uL (ref 1.4–7.7)
Neutrophils Relative %: 68.3 % (ref 43.0–77.0)
Platelets: 281 10*3/uL (ref 150.0–400.0)
RDW: 15.7 % — ABNORMAL HIGH (ref 11.5–14.6)

## 2012-12-11 NOTE — Progress Notes (Signed)
Chief Complaint  Patient presents with  . Follow-up    annual rov.  Pt c/o SOB, prod cough with green, occ. chest pain.    History of Present Illness: Joann Gutierrez is a 67 y.o. 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  She continues to have trouble with her breathing.  She is feeling short of breath with minimal activity.  She has cough with clear to green sputum.  She is getting occasional wheeze, but denies fever.  She has sinus congestion with post-nasal drip.  She uses flonase daily.  She was seen by her PCP recently and started on augmentin 2 days ago.  She had chest xray on 12/09/12 which showed elevated Rt hemidiaphragm.  TESTS: PSG 04/28/09 >> AHI 11, SpO2 low 68% Spirometry 07/05/10 >> FEV1 1.30 (64%), FEV1% 80 >> coughing during test Echo 05/21/11 >> EF 55 to 60%, mild LVH, mild/mod TR, PAS 45 mmHg ONO with CPAP and room air 10/22/12 >> Test time 10 hrs 59 min. Mean SpO2 92.4%, low SpO2 75%. Spent 16 min with SpO2 < 89%.  Joann Gutierrez  has a past medical history of Allergy; Depression; Diabetes mellitus (1997); GERD (gastroesophageal reflux disease); Hypertension; Hyperlipidemia; Osteoarthritis; Osteoporosis; Hair loss; Anemia; CAD (coronary artery disease); CVA (cerebral infarction); Diverticulosis; Esophageal stricture; Gastritis; Adenomatous colon polyp; Sickle cell trait; Asthma; COPD (chronic obstructive pulmonary disease); Secondary pulmonary hypertension (03/07/2009); Gastroparesis (08/21/2007); RENAL INSUFFICIENCY (02/16/2009); OSTEOARTHRITIS (08/09/2006); Morbid obesity; CHF (congestive heart failure); Carpal tunnel syndrome on left; PERIPHERAL NEUROPATHY, 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  Joann Gutierrez  has past surgical history that includes Abdominal hysterectomy; Replacement total knee (1998); Esophagogastroduodenoscopy  (06/25/2006); ERD (08/08/2000); Artery Biopsy (01/07/2011); Breast lumpectomy; Breast lumpectomy; Hernia repair; Colonoscopy; Upper gastrointestinal endoscopy; and bil foot surgery.  Prior to Admission medications   Medication Sig Start Date End Date Taking? Authorizing Provider  albuterol (VENTOLIN HFA) 108 (90 BASE) MCG/ACT inhaler Inhale 2 puffs into the lungs every 6 (six) hours as needed for shortness of breath. 07/07/12  Yes [name], MD  amLODipine (NORVASC) 5 MG tablet Take 5 mg by mouth daily.  01/08/12  Yes Historical Provider, MD  amoxicillin-clavulanate (AUGMENTIN) 875-125 MG per tablet 2 (two) times daily.   Yes Historical Provider, MD  aspirin 81 MG tablet Take 81 mg by mouth daily.     Yes Historical Provider, MD  atorvastatin (LIPITOR) 40 MG tablet Take 1 tablet (40 mg total) by mouth daily. 09/07/10  Yes Minus Breeding, MD  budesonide-formoterol [facility]) 160-4.5 MCG/ACT inhaler Inhale 2 puffs into the lungs 2 (two) times daily. 07/07/12  Yes [name], MD  Calcium Carbonate-Vit D-Min (CALTRATE PLUS PO) Take 600 mg by mouth daily.     Yes Historical Provider, MD  cefdinir (OMNICEF) 300 MG capsule Take 1 capsule (300 mg total) by mouth 2 (two) times daily. 10/19/12  Yes Tammy S Parrett, NP  citalopram (CELEXA) 20 MG tablet Take 20 mg by mouth daily.    Yes Historical Provider, MD  cyclobenzaprine (FLEXERIL) 10 MG tablet Take 1 tablet (10 mg total) by mouth daily. 04/29/11  Yes Renato Shin, MD  diclofenac (VOLTAREN) 75 MG EC tablet Take 1 tablet by mouth Daily. 04/25/11  Yes Historical Provider, MD  dicyclomine (BENTYL) 10 MG capsule Take 1 tablet by mouth Three times a day before meals. 08/03/10  Yes Historical Provider, MD  diltiazem (CARDIZEM CD) 240 MG  24 hr capsule Take 240 mg by mouth daily.     Yes Historical Provider, MD  donepezil (ARICEPT) 5 MG tablet Take 10 mg by mouth daily. 04/29/11  Yes Renato Shin, MD  EASY TOUCH PEN NEEDLES 31G X 5 MM [place]  02/21/12  Yes Historical  Provider, MD  ezetimibe (ZETIA) 10 MG tablet Take 10 mg by mouth daily.     Yes Historical Provider, MD  fluticasone (FLONASE) 50 MCG/ACT nasal spray Place 2 sprays into the nose daily. 01/16/12 01/15/13 Yes [name], MD  furosemide (LASIX) 80 MG tablet Take 80 mg by mouth 2 (two) times daily. 05/24/11  Yes Modena Jansky, MD  gabapentin (NEURONTIN) 300 MG capsule Take 1 capsule (300 mg total) by mouth 2 (two) times daily. 04/29/11  Yes Renato Shin, MD  hydrALAZINE (APRESOLINE) 50 MG tablet Take 1 tablet by mouth Daily. 06/21/11  Yes Historical Provider, MD  insulin glargine (LANTUS SOLOSTAR) 100 UNIT/ML injection Inject 30 Units into the skin every morning. And pen needles 1/day   Yes Historical Provider, MD  insulin NPH (HUMULIN N) 100 UNIT/ML injection Sliding scale with each meal 07/07/12  Yes Renato Shin, MD  Insulin Syringe-Needle U-100 (B-D INS SYR ULTRAFINE 1CC/31G) 31G X 5/16" 1 ML MISC Use as directecd to inject insulin 11/12/11  Yes Renato Shin, MD  KLOR-CON M20 20 MEQ tablet Take 1 tablet by mouth Daily. 06/19/11  Yes Historical Provider, MD  LIDODERM 5 % 1 patch once daily 10/20/11  Yes Historical Provider, MD  losartan-hydrochlorothiazide (HYZAAR) 100-25 MG per tablet Take 1 tablet by mouth daily. 07/19/11  Yes Cletus Gash, MD  metoCLOPramide (REGLAN) 10 MG tablet Take 1 tablet (10 mg total) by mouth 4 (four) times daily. 07/07/12  Yes Inda Castle, MD  metolazone (ZAROXOLYN) 2.5 MG tablet Take 1 tablet by mouth Daily. 06/19/11  Yes Historical Provider, MD  montelukast (SINGULAIR) 10 MG tablet Take 1 tablet (10 mg total) by mouth daily. 07/07/12  Yes [name], MD  Multiple Vitamin (MULTIVITAMIN PO) Take 1 tablet by mouth daily.     Yes Historical Provider, MD  omeprazole (PRILOSEC) 20 MG capsule Take 2 capsules (40 mg total) by mouth daily. 08/31/12  Yes Inda Castle, MD  raloxifene (EVISTA) 60 MG tablet Take 60 mg by mouth daily.     Yes Historical Provider, MD  sucralfate  (CARAFATE) 1 GM/10ML suspension Take 10 mLs (1 g total) by mouth 2 (two) times daily. 07/07/12  Yes Inda Castle, MD  TraMADol HCl 50 MG TBSO Take 1 tablet by mouth 4 (four) times daily as needed.     Yes Historical Provider, MD    Allergies  Allergen Reactions  . Promethazine Hcl     REACTION: lethargy     Physical Exam:  General - No distress ENT - No sinus tenderness, no oral exudate, no LAN Cardiac - s1s2 regular, no murmur Chest - No wheeze/rales/dullness Back - No focal tenderness Abd - Soft, non-tender Ext - No edema Neuro - Normal strength Skin - No rashes Psych - normal mood, and behavior   Assessment/Plan:  [name], MD [place] [facility] Pager:  [phone]

## 2012-12-11 NOTE — Patient Instructions (Signed)
Lab test today Follow up in 6 weeks

## 2012-12-13 ENCOUNTER — Encounter: Payer: Self-pay | Admitting: Pulmonary Disease

## 2012-12-13 DIAGNOSIS — R0981 Nasal congestion: Secondary | ICD-10-CM | POA: Insufficient documentation

## 2012-12-13 DIAGNOSIS — J9610 Chronic respiratory failure, unspecified whether with hypoxia or hypercapnia: Secondary | ICD-10-CM | POA: Insufficient documentation

## 2012-12-13 NOTE — Assessment & Plan Note (Signed)
She is to continue CPAP with 2 liters oxygen at night.

## 2012-12-13 NOTE — Assessment & Plan Note (Signed)
She was started on augmentin recently for sinusitis/bronchitis.  I don't think she needs prednisone at this time.  She is to continue symbicort, singulair, and prn albuterol.

## 2012-12-13 NOTE — Assessment & Plan Note (Addendum)
Will check RAST with IgE and determine if she needs additional allergy assessment/therapy.  She is to finish course of augmentin from PCP, and continue flonase.

## 2012-12-13 NOTE — Assessment & Plan Note (Signed)
Related to COPD with asthma, and chronic Rt hemidiaphragm elevation.

## 2012-12-14 LAB — ALLERGY FULL PROFILE
Allergen, D pternoyssinus,d7: <0.1 kU/L
Alternaria Alternata: <0.1 kU/L
Aspergillus fumigatus, m3: <0.1 kU/L
Bahia Grass: <0.1 kU/L
Cat Dander: <0.1 kU/L
Common Ragweed: <0.1 kU/L
Curvularia lunata: <0.1 kU/L
D. farinae: <0.1 kU/L
Dog Dander: <0.1 kU/L
Elm IgE: <0.1 kU/L
Fescue: <0.1 kU/L
G009 Red Top: <0.1 kU/L
Goldenrod: <0.1 kU/L
Helminthosporium halodes: <0.1 kU/L
House Dust Hollister: <0.1 kU/L
Lamb's Quarters: <0.1 kU/L
Plantain: <0.1 kU/L
Stemphylium Botryosum: <0.1 kU/L
Sycamore Tree: <0.1 kU/L

## 2012-12-15 ENCOUNTER — Ambulatory Visit (INDEPENDENT_AMBULATORY_CARE_PROVIDER_SITE_OTHER): Payer: Medicare Other | Admitting: Nurse Practitioner

## 2012-12-15 ENCOUNTER — Telehealth: Payer: Self-pay | Admitting: Pulmonary Disease

## 2012-12-15 ENCOUNTER — Encounter: Payer: Self-pay | Admitting: Nurse Practitioner

## 2012-12-15 VITALS — BP 120/80 | HR 82 | Ht 64.5 in | Wt 275.0 lb

## 2012-12-15 DIAGNOSIS — R0989 Other specified symptoms and signs involving the circulatory and respiratory systems: Secondary | ICD-10-CM | POA: Diagnosis not present

## 2012-12-15 DIAGNOSIS — R0600 Dyspnea, unspecified: Secondary | ICD-10-CM

## 2012-12-15 DIAGNOSIS — R0609 Other forms of dyspnea: Secondary | ICD-10-CM | POA: Diagnosis not present

## 2012-12-15 DIAGNOSIS — J9610 Chronic respiratory failure, unspecified whether with hypoxia or hypercapnia: Secondary | ICD-10-CM

## 2012-12-15 LAB — BRAIN NATRIURETIC PEPTIDE: Pro B Natriuretic peptide (BNP): 24 pg/mL (ref 0.0–100.0)

## 2012-12-15 LAB — CBC WITH DIFFERENTIAL/PLATELET
Basophils Absolute: 0 10*3/uL (ref 0.0–0.1)
Basophils Relative: 0.3 % (ref 0.0–3.0)
Eosinophils Absolute: 0.1 10*3/uL (ref 0.0–0.7)
Eosinophils Relative: 1 % (ref 0.0–5.0)
HCT: 32.2 % — ABNORMAL LOW (ref 36.0–46.0)
Hemoglobin: 10.2 g/dL — ABNORMAL LOW (ref 12.0–15.0)
Lymphocytes Relative: 25.4 % (ref 12.0–46.0)
Lymphs Abs: 1.9 10*3/uL (ref 0.7–4.0)
MCHC: 31.7 g/dL (ref 30.0–36.0)
MCV: 73.1 fl — ABNORMAL LOW (ref 78.0–100.0)
Monocytes Absolute: 0.5 10*3/uL (ref 0.1–1.0)
Monocytes Relative: 6.9 % (ref 3.0–12.0)
Neutro Abs: 5 10*3/uL (ref 1.4–7.7)
Neutrophils Relative %: 66.4 % (ref 43.0–77.0)
Platelets: 288 10*3/uL (ref 150.0–400.0)
RBC: 4.4 Mil/uL (ref 3.87–5.11)
RDW: 16.1 % — ABNORMAL HIGH (ref 11.5–14.6)
WBC: 7.6 10*3/uL (ref 4.5–10.5)

## 2012-12-15 LAB — BASIC METABOLIC PANEL
BUN: 18 mg/dL (ref 6–23)
CO2: 30 mEq/L (ref 19–32)
Calcium: 9.2 mg/dL (ref 8.4–10.5)
Chloride: 104 mEq/L (ref 96–112)
Creatinine, Ser: 1.5 mg/dL — ABNORMAL HIGH (ref 0.4–1.2)
GFR: 43.79 mL/min — ABNORMAL LOW (ref >60.00)
Glucose, Bld: 115 mg/dL — ABNORMAL HIGH (ref 70–99)
Potassium: 3.6 mEq/L (ref 3.5–5.1)
Sodium: 143 mEq/L (ref 135–145)

## 2012-12-15 NOTE — Progress Notes (Signed)
Joann Gutierrez Date of Birth: 1945-08-13 Medical Record [id number]  History of Present Illness: [name] is seen back today for a work in visit. Seen for Joann Gutierrez. She has multiple medical issues as outlined below and include depression, dementia, DM, GERD, HTN, HLD, OA, sickle cell anemia, CVA, gastritis, COPD, neuropathy, pulmonary HTN, CKD, morbid obesity, and OSA. Remote cath with just very minimal coronary disease noted in the past - July 2005. Normal Myoview in October of 2013.   Has not been seen here since October of 2013.   Sent here by her PCP - saw him last week with chest pain exacerbated by walking and also with chills, cough productive of green phlegm and wheezing and headaches. Joann Gutierrez had been sick as well. Given amoxicillin and also sent here.   Comes in today.  Here with her Joann Gutierrez. At first she told me she was here for a regular check. Then remembered that she had had some chest pain - seems to be worse with activities. She is short of breath with and without activity. Remains obese. No real swelling and no real change in her weight. She admits that she is quite sedentary. Does not seem to know what is being done for her sickle cell. Continues to cough with productive sputum. Remains on antibiotics. Saw pulmonary last week as well. Thinks she is due to see [name] next week.    Current Outpatient Prescriptions  Medication Sig Dispense Refill  . albuterol (VENTOLIN HFA) 108 (90 BASE) MCG/ACT inhaler Inhale 2 puffs into the lungs every 6 (six) hours as needed for shortness of breath.  1 Inhaler  6  . amLODipine (NORVASC) 5 MG tablet Take 5 mg by mouth daily.       Marland Kitchen amoxicillin-clavulanate (AUGMENTIN) 875-125 MG per tablet 2 (two) times daily.      Marland Kitchen aspirin 81 MG tablet Take 81 mg by mouth daily.        Marland Kitchen atorvastatin (LIPITOR) 40 MG tablet Take 1 tablet (40 mg total) by mouth daily.  30 tablet  6  . budesonide-formoterol (SYMBICORT) 160-4.5 MCG/ACT inhaler  Inhale 2 puffs into the lungs 2 (two) times daily.  1 Inhaler  6  . Calcium Carbonate-Vit D-Min (CALTRATE PLUS PO) Take 600 mg by mouth daily.        . citalopram (CELEXA) 20 MG tablet Take 20 mg by mouth daily.       . cyclobenzaprine (FLEXERIL) 10 MG tablet Take 1 tablet (10 mg total) by mouth daily.  30 tablet  5  . diclofenac (VOLTAREN) 75 MG EC tablet Take 1 tablet by mouth Daily.      Marland Kitchen dicyclomine (BENTYL) 10 MG capsule Take 1 tablet by mouth Three times a day before meals.      Marland Kitchen diltiazem (CARDIZEM CD) 240 MG 24 hr capsule Take 240 mg by mouth daily.        Marland Kitchen donepezil (ARICEPT) 5 MG tablet Take 10 mg by mouth daily.      Marland Kitchen EASY TOUCH PEN NEEDLES 31G X 5 MM MISC       . ezetimibe (ZETIA) 10 MG tablet Take 10 mg by mouth daily.        . fluticasone (FLONASE) 50 MCG/ACT nasal spray Place 2 sprays into the nose daily.  16 g  5  . furosemide (LASIX) 80 MG tablet Take 80 mg by mouth 2 (two) times daily.      Marland Kitchen gabapentin (NEURONTIN) 300 MG capsule Take  1 capsule (300 mg total) by mouth 2 (two) times daily.  60 capsule  5  . hydrALAZINE (APRESOLINE) 50 MG tablet Take 1 tablet by mouth Daily.      . insulin glargine (LANTUS SOLOSTAR) 100 UNIT/ML injection Inject 30 Units into the skin every morning. And pen needles 1/day      . insulin NPH (HUMULIN N) 100 UNIT/ML injection Sliding scale with each meal      . Insulin Syringe-Needle U-100 (B-D INS SYR ULTRAFINE 1CC/31G) 31G X 5/16" 1 ML MISC Use as directecd to inject insulin  100 each  5  . KLOR-CON M20 20 MEQ tablet Take 1 tablet by mouth Daily.      Marland Kitchen LIDODERM 5 % 1 patch once daily      . losartan-hydrochlorothiazide (HYZAAR) 100-25 MG per tablet Take 1 tablet by mouth daily.  30 tablet  11  . metoCLOPramide (REGLAN) 10 MG tablet Take 1 tablet (10 mg total) by mouth 4 (four) times daily.  360 tablet  3  . metolazone (ZAROXOLYN) 2.5 MG tablet Take 1 tablet by mouth Daily.      . montelukast (SINGULAIR) 10 MG tablet Take 1 tablet (10 mg  total) by mouth daily.  30 tablet  6  . Multiple Vitamin (MULTIVITAMIN PO) Take 1 tablet by mouth daily.        Marland Kitchen omeprazole (PRILOSEC) 20 MG capsule Take 2 capsules (40 mg total) by mouth daily.  180 capsule  3  . raloxifene (EVISTA) 60 MG tablet Take 60 mg by mouth daily.        . sucralfate (CARAFATE) 1 GM/10ML suspension Take 10 mLs (1 g total) by mouth 2 (two) times daily.  1260 mL  3  . TraMADol HCl 50 MG TBSO Take 1 tablet by mouth 4 (four) times daily as needed.         No current facility-administered medications for this visit.    Allergies  Allergen Reactions  . Promethazine Hcl     REACTION: lethargy    Past Medical History  Diagnosis Date  . Allergy   . Depression   . Diabetes mellitus 1997    Type II   . GERD (gastroesophageal reflux disease)   . Hypertension   . Hyperlipidemia   . Osteoarthritis   . Osteoporosis   . Hair loss   . Anemia   . CAD (coronary artery disease)     Mild very minimal coronary disease with 20% obtuse marginal stenosis  . CVA (cerebral infarction)   . Diverticulosis   . Esophageal stricture   . Gastritis   . Adenomatous colon polyp   . Sickle cell trait   . Asthma        . COPD (chronic obstructive pulmonary disease)   . 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 Gastroparesis 08/21/2007  . RENAL INSUFFICIENCY 02/16/2009  . OSTEOARTHRITIS 08/09/2006  . Morbid obesity   . CHF (congestive heart failure)   . Carpal tunnel syndrome on left   . 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 Hernia, hiatal   . Seizures     pt thinks it has been several monthes since she had a seisure  . Stroke   . Hearing loss of both ears   . 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 Abdominal hysterectomy    . Replacement total knee  1998    Left  . Esophagogastroduodenoscopy  06/25/2006  .  Erd  08/08/2000  . Artery biopsy  01/07/2011    Procedure: MINOR BIOPSY TEMPORAL ARTERY;   Surgeon: Haywood Lasso, MD;  Location: [address];  Service: General;  Laterality: Left;  left temporal artery biopsy  . Breast lumpectomy      benign  . Breast lumpectomy      both breast lumps removed   . Hernia repair    . Colonoscopy    . Upper gastrointestinal endoscopy    . Bil foot surgery      History  Smoking status  . Former Smoker -- 0.50 packs/day for 10 years  . Quit date: 04/18/1980  Smokeless tobacco  . Never Used    History  Alcohol Use No    Family History  Problem Relation Age of Onset  . Colon cancer Brother   . Cancer Brother     Colon Cancer  . Cancer Mother     Liver Cancer  . Diabetes Mother   . Kidney disease Mother   . Heart disease Mother     age 62's  . Heart disease Father 58    MI  . Heart attack Father     died of MI when pt was 10  . Sickle cell anemia Father   . Diabetes Sister   . Kidney disease Sister   . Heart disease Sister     age 27's  . Allergies Sister   . Diabetes Sister   . Kidney disease Sister   . Heart disease Sister     age 30's  . Esophageal cancer Neg Hx   . Rectal cancer Neg Hx   . Stomach cancer Neg Hx     Review of Systems: The review of systems is per the HPI.  All other systems were reviewed and are negative.  Physical Exam: BP 120/80  Pulse 82  Ht 5' 4.5" (1.638 m)  Wt 275 lb (124.739 kg)  BMI 46.49 kg/m2 Patient is alert and in no acute distress. She seems to be unsure in her answers to me. Looks much older than her stated age. Morbidly obese. Skin is warm and dry. Color is normal.  HEENT is unremarkable. Normocephalic/atraumatic. PERRL. Sclera are nonicteric. Neck is supple. No masses. No JVD. Lungs are clear. Cardiac exam shows a regular rate and rhythm. Abdomen is obese but soft. Extremities are without edema. Gait and ROM are intact. No gross neurologic deficits noted.  Wt Readings from Last 3 Encounters:  12/15/12 275 lb (124.739 kg)  12/11/12 280 lb 12.8 oz (127.37 kg)    12/02/12 277 lb (125.646 kg)     LABORATORY DATA: EKG today with sinus rhythm. Septal Q's - tracing is unchanged.  Dg Chest 2 View  12/09/2012   CLINICAL DATA:  Acute bronchitis, cough, shortness of breath, mid chest pain for 3 weeks  EXAM: CHEST  2 VIEW  COMPARISON:  10/19/2012  FINDINGS: Significant elevation of the right diaphragm, stable. Heart size upper normal. Vascular pattern normal. No infiltrate or consolidation. No pleural effusion. No change from prior study.  IMPRESSION: No active cardiopulmonary disease.   Electronically Signed   By: Skipper Cliche M.D.   On: 12/09/2012 13:15    Lab Results  Component Value Date   WBC 7.7 12/11/2012   HGB 10.2* 12/11/2012   HCT 31.6* 12/11/2012   PLT 281.0 12/11/2012   GLUCOSE 161* 07/24/2011   CHOL 162 07/24/2011   TRIG 80 07/24/2011   HDL 45 07/24/2011   LDLDIRECT 190.0  01/02/2009   LDLCALC 101* 07/24/2011   ALT 12 05/18/2011   AST 20 05/18/2011   NA 140 07/24/2011   K 3.7 07/24/2011   CL 103 07/24/2011   CREATININE 1.36* 07/24/2011   BUN 30* 07/24/2011   CO2 32 07/24/2011   TSH 5.497* 11/09/2009   INR 1.0 ratio 07/17/2009   HGBA1C 6.9* 04/29/2012   MICROALBUR 5.3* 04/29/2012   Myoview Impression from October 2013  Exercise Capacity: Lexiscan with low level exercise.  BP Response: Normal blood pressure response.  Clinical Symptoms: Mild chest pain/dyspnea.  ECG Impression: No significant ST segment change suggestive of ischemia.  Comparison with Prior Nuclear Study: No significant change from report of 2005.  Overall Impression: Normal stress nuclear study.  LV Ejection Fraction: 72%. LV Wall Motion: NL LV Function; NL Wall Motion  Dorris Carnes   RESULTS: CORONARY ARTERIES:  1. The left main coronary artery was normal.  2. The left anterior descending artery was normal.  3. The circumflex coronary artery was normal. The first obtuse marginal had  a 30% ostial lesion.  4. The right coronary artery was dominant and normal.  5. The patient had  somewhat tortuous coronaries consistent with her high  blood pressure.  RAO VENTRICULOGRAPHY: The RAO ventriculography was normal. Ejection  fraction was in the 70% range. There was no gradient across on the aortic  valve and no mitral regurgitation. The aortic root seemed of normal caliber  as seen by the RAO ventriculogram.  PRESSURES:  1. The LV pressure was 135/18.  2. Aortic pressure was 135/96.  IMPRESSION: The patient's chest pain would appear to be noncardiac in  etiology. She has had normal heart catheterization in 2000 and essentially  normal heart catheterization now. Further workup of chest pain and in  regards to her sickle cell disease or musculoskeletal problems can be  worked up by Dr. Loanne Drilling. She will resume Glucophage in 24 hours. She  tolerated the procedure well.  Jenkins Rouge, M.D.  PN/MEDQ D: 07/28/2003 T: 07/28/2003 Job: [id number]  Assessment / Plan: 1. Chest pain - minimal CAD per remote cath - normal Myoview October of 2013 - her symptoms are quite hard to sort out. She has numerous risk factors. Will update her myoview. Continue her treatment for her bronchitis  2. Multiple medical issues - unfortunately she is quite sedentary which just compounds her medical issues and has some dementia. Not sure what the status on her anemia is. Goals of care will need to be clarified.   Will arrange for Myoview but overall prognosis seems quite tenuous to me.  Patient is agreeable to this plan and will call if any problems develop in the interim.   Burtis Junes, RN, [facility] [address] [place], [place]  [address]

## 2012-12-15 NOTE — Telephone Encounter (Signed)
CBC    Component Value Date/Time   WBC 7.7 12/11/2012 1305   RBC 4.36 12/11/2012 1305   HGB 10.2* 12/11/2012 1305   HCT 31.6* 12/11/2012 1305   PLT 281.0 12/11/2012 1305   MCV 72.6* 12/11/2012 1305   MCHC 32.1 12/11/2012 1305   RDW 15.7* 12/11/2012 1305   LYMPHSABS 1.9 12/11/2012 1305   MONOABS 0.5 12/11/2012 1305   EOSABS 0.1 12/11/2012 1305   BASOSABS 0.0 12/11/2012 1305    RAST 12/11/12 >> negative, IgE 63.3  Results d/w pt.  No change to current treatment plan.

## 2012-12-15 NOTE — Patient Instructions (Signed)
We will arrange for a repeat stress test  For now, stay on your current medicines  We will check labs today  Call the [facility] office at [phone] if you have any questions, problems or concerns.

## 2012-12-16 DIAGNOSIS — M2550 Pain in unspecified joint: Secondary | ICD-10-CM | POA: Diagnosis not present

## 2012-12-16 DIAGNOSIS — M069 Rheumatoid arthritis, unspecified: Secondary | ICD-10-CM | POA: Diagnosis not present

## 2012-12-16 DIAGNOSIS — M1990 Unspecified osteoarthritis, unspecified site: Secondary | ICD-10-CM | POA: Diagnosis not present

## 2012-12-16 DIAGNOSIS — G5600 Carpal tunnel syndrome, unspecified upper limb: Secondary | ICD-10-CM | POA: Diagnosis not present

## 2012-12-21 ENCOUNTER — Telehealth: Payer: Self-pay | Admitting: Cardiology

## 2012-12-21 DIAGNOSIS — J40 Bronchitis, not specified as acute or chronic: Secondary | ICD-10-CM | POA: Diagnosis not present

## 2012-12-21 NOTE — Telephone Encounter (Signed)
Follow Up: ° ° ° °Pt returning call for results.  °

## 2013-01-05 ENCOUNTER — Encounter: Payer: Self-pay | Admitting: Cardiology

## 2013-01-05 ENCOUNTER — Ambulatory Visit (HOSPITAL_COMMUNITY): Payer: Medicare Other | Attending: Cardiology | Admitting: Radiology

## 2013-01-05 VITALS — BP 169/83 | HR 70 | Ht 64.5 in | Wt 280.0 lb

## 2013-01-05 DIAGNOSIS — R0602 Shortness of breath: Secondary | ICD-10-CM

## 2013-01-05 DIAGNOSIS — J9610 Chronic respiratory failure, unspecified whether with hypoxia or hypercapnia: Secondary | ICD-10-CM | POA: Diagnosis not present

## 2013-01-05 DIAGNOSIS — R079 Chest pain, unspecified: Secondary | ICD-10-CM

## 2013-01-05 DIAGNOSIS — R0609 Other forms of dyspnea: Secondary | ICD-10-CM | POA: Diagnosis not present

## 2013-01-05 DIAGNOSIS — R0600 Dyspnea, unspecified: Secondary | ICD-10-CM

## 2013-01-05 DIAGNOSIS — R0989 Other specified symptoms and signs involving the circulatory and respiratory systems: Secondary | ICD-10-CM | POA: Insufficient documentation

## 2013-01-05 MED ORDER — AMINOPHYLLINE 25 MG/ML IV SOLN
75.0000 mg | Freq: Once | INTRAVENOUS | Status: AC
  Administered 2013-01-05: 75 mg via INTRAVENOUS

## 2013-01-05 MED ORDER — REGADENOSON 0.4 MG/5ML IV SOLN
0.4000 mg | Freq: Once | INTRAVENOUS | Status: AC
  Administered 2013-01-05: 0.4 mg via INTRAVENOUS

## 2013-01-05 MED ORDER — ALBUTEROL SULFATE (5 MG/ML) 0.5% IN NEBU
5.0000 mg | INHALATION_SOLUTION | Freq: Once | RESPIRATORY_TRACT | Status: AC
  Administered 2013-01-05: 5 mg via RESPIRATORY_TRACT

## 2013-01-05 MED ORDER — TECHNETIUM TC 99M SESTAMIBI GENERIC - CARDIOLITE
33.0000 | Freq: Once | INTRAVENOUS | Status: AC | PRN
  Administered 2013-01-05: 33 via INTRAVENOUS

## 2013-01-05 NOTE — Progress Notes (Signed)
[facility] [address] [place],  [address] [phone]    Cardiology Nuclear Med Study  NOVELLA HUBLEY is a 67 y.o. female     MRN : [id number]     DOB: 06-Nov-1945  Procedure Date: 01/05/2013  Nuclear Med Background Indication for Stress Test:  Evaluation for Ischemia History:  Cath 2011 (normal), CHF, Echo 04/2011 EF 55-60%, MPI 10/2101 (normal) 72%, COPD, Hx. seizures, OSA Cardiac Risk Factors: CVA, Family History - CAD, History of Smoking, Hypertension, IDDM, and Lipids  Symptoms:  Chest Pain with Exertion (last date of chest discomfort was yesterday), Dizziness, DOE, Palpitations and SOB   Nuclear Pre-Procedure Caffeine/Decaff Intake:  None > 12 hrs NPO After: 4:00pm   Lungs:  clear O2 Sat: 99% on nebulizer treatment. IV 0.9% NS with Angio Cath:  22g  IV Site: R Antecubital x 1, tolerated well IV Started by:  Irven Baltimore, RN  Chest Size (in):  42 Cup Size: B  Height: 5' 4.5" (1.638 m)  Weight:  280 lb (127.007 kg)  BMI:  Body mass index is 47.34 kg/(m^2). Tech Comments:  Last Nebulizer treatment was last night. Patient uses 4 x a day.O2 Sat 97% RA, lungs clear but DOE with ambulation on arrival. Nebulizer treatment given with 5 mg albuterol solution with 8L O2 here at 0840,tolerated well. Irven Baltimore, RN.    Nuclear Med Study 1 or 2 day study: 2 day  Stress Test Type:  Lexiscan  Reading MD: Kirk Ruths, MD  Order Authorizing Provider:  Minus Breeding, MD, and Truitt Merle, NP  Resting Radionuclide: Technetium 69m Sestamibi  Resting Radionuclide Dose: 33.0 mCi on 01-06-13  Stress Radionuclide:  Technetium 57m Sestamibi  Stress Radionuclide Dose: 33.0 mCi on 01-05-13          Stress Protocol Rest HR: 70 Stress HR: 99  Rest BP: 169/83 Stress BP: 160/68  Exercise Time (min): n/a METS: n/a           Dose of Adenosine (mg):  n/a Dose of Lexiscan: 0.4 mg  Dose of Atropine (mg): n/a Dose of Dobutamine: n/a mcg/kg/min  (at max HR)  Stress Test Technologist: Glade Lloyd, BS-ES  Nuclear Technologist:  Charlton Amor, CNMT     Rest Procedure:  Myocardial perfusion imaging was performed at rest 45 minutes following the intravenous administration of Technetium 16m Sestamibi. Rest ECG: NSR - Normal EKG  Stress Procedure:  The patient received IV Lexiscan 0.4 mg over 15-seconds.  Technetium 19m Sestamibi injected at 30-seconds.  Quantitative spect images were obtained after a 45 minute delay.  During the infusion of Lexiscan, the patient complained of an 8/10 chest tightness.  75 mg Aminophylline was given by Hermine Messick, RN.  The patients symptoms quickly began to resolve.  Stress ECG: No significant change from baseline ECG  QPS Raw Data Images:  There is interference from nuclear activity from structures below the diaphragm. This does not affect the ability to read the study. Stress Images:  There is decreased uptake in the apical, mid and basal inferior walls. Rest Images:  There is decreased uptake in the apical inferior wall. Subtraction (SDS):  There is medium size moderate severity predominantly reversible defect in the inferior wall. Transient Ischemic Dilatation (Normal <1.22):  0.94 Lung/Heart Ratio (Normal <0.45):  0.27  Quantitative Gated Spect Images QGS EDV:  108 ml QGS ESV:  37 ml  Impression Exercise Capacity:  Lexiscan with no exercise. BP Response:  Normal blood pressure response.  Clinical Symptoms:  Mild chest pain/dyspnea. ECG Impression:  No significant ST segment change suggestive of ischemia. Comparison with Prior Nuclear Study: There is change when compared to the study from 2013.  Overall Impression:  Intermediate risk stress nuclear study with medium size moderate severity reversible defect in the PDA territory..  LV Ejection Fraction: 66%.  LV Wall Motion:  NL LV Function; NL Wall Motion  Ena Dawley, [name] 01/06/2013\

## 2013-01-06 ENCOUNTER — Ambulatory Visit (HOSPITAL_COMMUNITY): Payer: Medicare Other | Attending: Cardiology

## 2013-01-06 DIAGNOSIS — R0989 Other specified symptoms and signs involving the circulatory and respiratory systems: Secondary | ICD-10-CM

## 2013-01-06 MED ORDER — TECHNETIUM TC 99M SESTAMIBI GENERIC - CARDIOLITE
33.0000 | Freq: Once | INTRAVENOUS | Status: AC | PRN
  Administered 2013-01-06: 33 via INTRAVENOUS

## 2013-01-08 ENCOUNTER — Other Ambulatory Visit: Payer: Self-pay | Admitting: *Deleted

## 2013-01-08 MED ORDER — ISOSORBIDE MONONITRATE ER 60 MG PO TB24: 60.0000 mg | ORAL_TABLET | Freq: Every day | ORAL | Status: DC

## 2013-01-25 ENCOUNTER — Ambulatory Visit (INDEPENDENT_AMBULATORY_CARE_PROVIDER_SITE_OTHER): Payer: Medicare Other | Admitting: Cardiology

## 2013-01-25 ENCOUNTER — Encounter: Payer: Self-pay | Admitting: Cardiology

## 2013-01-25 VITALS — BP 150/80 | HR 88 | Ht 64.5 in | Wt 285.0 lb

## 2013-01-25 DIAGNOSIS — R079 Chest pain, unspecified: Secondary | ICD-10-CM | POA: Diagnosis not present

## 2013-01-25 DIAGNOSIS — I10 Essential (primary) hypertension: Secondary | ICD-10-CM | POA: Diagnosis not present

## 2013-01-25 DIAGNOSIS — I421 Obstructive hypertrophic cardiomyopathy: Secondary | ICD-10-CM | POA: Diagnosis not present

## 2013-01-25 MED ORDER — ISOSORBIDE MONONITRATE ER 120 MG PO TB24: 120.0000 mg | ORAL_TABLET | Freq: Every day | ORAL | Status: DC

## 2013-01-25 NOTE — Progress Notes (Signed)
HPI The patient presents for evaluation of chest pain. His most recent visit she was complaining of some chest heaviness and dyspnea with exertion. She's had workups before with abnormal stress perfusion studies. However, on catheterization several years ago she had only minimal coronary plaque.  Her stress perfusion study that was ordered last month demonstrated an EF of 66% with a moderate reversible perfusion defect in the territory of the PDA. She continues to have chest discomfort that happens like a heaviness when she does anything. She gets short of breath. It seems that dyspnea is the predominant complaint. However, the discomfort is described as 8/10. She also seems to have this at rest but is quite vague about it. She says it may take 15 minutes for it to clear up. She says she does get sweaty she does get nauseated. She's not describing PND or orthopnea. She's not sure whether she's never had these symptoms before and whether this is the same as the symptoms she had at the time of her previous cath. She's not particularly active. She is morbidly obese with some previous joint problems.  Allergies  Allergen Reactions  . Promethazine Hcl     REACTION: lethargy    Current Outpatient Prescriptions  Medication Sig Dispense Refill  . albuterol (VENTOLIN HFA) 108 (90 BASE) MCG/ACT inhaler Inhale 2 puffs into the lungs every 6 (six) hours as needed for shortness of breath.  1 Inhaler  6  . amLODipine (NORVASC) 5 MG tablet Take 5 mg by mouth daily.       Marland Kitchen aspirin 81 MG tablet Take 81 mg by mouth daily.        Marland Kitchen atorvastatin (LIPITOR) 40 MG tablet Take 1 tablet (40 mg total) by mouth daily.  30 tablet  6  . budesonide-formoterol (SYMBICORT) 160-4.5 MCG/ACT inhaler Inhale 2 puffs into the lungs 2 (two) times daily.  1 Inhaler  6  . Calcium Carbonate-Vit D-Min (CALTRATE PLUS PO) Take 600 mg by mouth daily.        . cefdinir (OMNICEF) 300 MG capsule       . citalopram (CELEXA) 20 MG tablet Take  20 mg by mouth daily.       . cyclobenzaprine (FLEXERIL) 10 MG tablet Take 1 tablet (10 mg total) by mouth daily.  30 tablet  5  . diclofenac (VOLTAREN) 75 MG EC tablet Take 1 tablet by mouth Daily.      Marland Kitchen dicyclomine (BENTYL) 10 MG capsule Take 1 tablet by mouth Three times a day before meals.      Marland Kitchen diltiazem (CARDIZEM CD) 240 MG 24 hr capsule Take 240 mg by mouth daily.        Marland Kitchen donepezil (ARICEPT) 5 MG tablet Take 10 mg by mouth daily.      . DULERA 100-5 MCG/ACT AERO       . EASY TOUCH PEN NEEDLES 31G X 5 MM MISC       . ezetimibe (ZETIA) 10 MG tablet Take 10 mg by mouth daily.        . furosemide (LASIX) 80 MG tablet Take 80 mg by mouth 2 (two) times daily.      Marland Kitchen gabapentin (NEURONTIN) 300 MG capsule Take 1 capsule (300 mg total) by mouth 2 (two) times daily.  60 capsule  5  . hydrALAZINE (APRESOLINE) 50 MG tablet Take 1 tablet by mouth Daily.      . insulin glargine (LANTUS SOLOSTAR) 100 UNIT/ML injection Inject 30 Units into the skin  every morning. And pen needles 1/day      . insulin NPH (HUMULIN N) 100 UNIT/ML injection Sliding scale with each meal      . Insulin Syringe-Needle U-100 (B-D INS SYR ULTRAFINE 1CC/31G) 31G X 5/16" 1 ML MISC Use as directecd to inject insulin  100 each  5  . isosorbide mononitrate (IMDUR) 120 MG 24 hr tablet Take 1 tablet (120 mg total) by mouth daily.  30 tablet  6  . KLOR-CON M20 20 MEQ tablet Take 1 tablet by mouth Daily.      Marland Kitchen LIDODERM 5 % 1 patch once daily      . losartan-hydrochlorothiazide (HYZAAR) 100-25 MG per tablet Take 1 tablet by mouth daily.  30 tablet  11  . methotrexate (RHEUMATREX) 2.5 MG tablet       . metoCLOPramide (REGLAN) 10 MG tablet Take 1 tablet (10 mg total) by mouth 4 (four) times daily.  360 tablet  3  . metolazone (ZAROXOLYN) 2.5 MG tablet Take 1 tablet by mouth Daily.      . montelukast (SINGULAIR) 10 MG tablet Take 1 tablet (10 mg total) by mouth daily.  30 tablet  6  . Multiple Vitamin (MULTIVITAMIN PO) Take 1 tablet by  mouth daily.        Marland Kitchen omeprazole (PRILOSEC) 20 MG capsule Take 2 capsules (40 mg total) by mouth daily.  180 capsule  3  . raloxifene (EVISTA) 60 MG tablet Take 60 mg by mouth daily.        . sucralfate (CARAFATE) 1 GM/10ML suspension Take 10 mLs (1 g total) by mouth 2 (two) times daily.  1260 mL  3  . TraMADol HCl 50 MG TBSO Take 1 tablet by mouth 4 (four) times daily as needed.        . fluticasone (FLONASE) 50 MCG/ACT nasal spray Place 2 sprays into the nose daily.  16 g  5   No current facility-administered medications for this visit.    Past Medical History  Diagnosis Date  . Allergy   . Depression   . Diabetes mellitus 1997    Type II   . GERD (gastroesophageal reflux disease)   . Hypertension   . Hyperlipidemia   . Osteoarthritis   . Osteoporosis   . Hair loss   . Anemia   . CAD (coronary artery disease)     Mild very minimal coronary disease with 20% obtuse marginal stenosis  . CVA (cerebral infarction)   . Diverticulosis   . Esophageal stricture   . Gastritis   . Adenomatous colon polyp   . Sickle cell trait   . Asthma        . COPD (chronic obstructive pulmonary disease)   . 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 Gastroparesis 08/21/2007  . RENAL INSUFFICIENCY 02/16/2009  . OSTEOARTHRITIS 08/09/2006  . Morbid obesity   . CHF (congestive heart failure)   . Carpal tunnel syndrome on left   . 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 Hernia, hiatal   . Seizures     pt thinks it has been several monthes since she had a seisure  . Stroke   . Hearing loss of both ears   . 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 Abdominal hysterectomy    . Replacement total knee  1998    Left  . Esophagogastroduodenoscopy  06/25/2006  . Erd  08/08/2000  . Artery biopsy  01/07/2011    Procedure: MINOR BIOPSY TEMPORAL ARTERY;  Surgeon: Haywood Lasso, MD;  Location: [facility];  Service:  General;  Laterality: Left;  left temporal artery biopsy  . Breast lumpectomy      benign  . Breast lumpectomy      both breast lumps removed   . Hernia repair    . Colonoscopy    . Upper gastrointestinal endoscopy    . Bil foot surgery      ROS:  As stated in the HPI and negative for all other systems.  PHYSICAL EXAM BP 150/80  Pulse 88  Ht 5' 4.5" (1.638 m)  Wt 285 lb (129.275 kg)  BMI 48.18 kg/m2  SpO2 97% GENERAL:  Well appearing HEENT:  Pupils equal round and reactive, fundi not visualized, oral mucosa unremarkable NECK:  No jugular venous distention, waveform within normal limits, carotid upstroke brisk and symmetric, no bruits, no thyromegaly LYMPHATICS:  No cervical, inguinal adenopathy LUNGS:  Clear to auscultation bilaterally BACK:  No CVA tenderness CHEST:  Unremarkable HEART:  PMI not displaced or sustained,S1 and S2 within normal limits, no S3, no S4, no clicks, no rubs, 2/6 apical systolic murmur heard best at the right upper sternal border and increasing slightly with a strain phase of Valsalvamurmurs ABD:  Flat, positive bowel sounds normal in frequency in pitch, no bruits, no rebound, no guarding, no midline pulsatile mass, no hepatomegaly, no splenomegaly, obese EXT:  2 plus pulses throughout, no edema, no cyanosis no clubbing SKIN:  No rashes no nodules NEURO:  Cranial nerves II through XII grossly intact, motor grossly intact throughout PSYCH:  Cognitively intact, oriented to person place and time  EKG:  Sinus rhythm, rate 88, axis within normal limits, intervals within normal limits, no acute ST-T wave changes.  01/25/2013  ASSESSMENT AND PLAN  CHEST PAIN It is still quite difficult to assess her chronic symptoms.I went back and reviewed images from 2011, 2013 and this most recent. She did have perfusion defect in 2011 it was more prominent than 2013 and comparable to what I'm seeing now. It's unclear whether this could be some diaphragmatic attenuation.  It  is a moderate risk rather than high risk image. Rather than jumping into another invasive study would like to pursue medical management by increasing her Imdur to 120 mg daily. Will valuate slightly dynamic murmur I will check an echocardiogram. She has an appointment with her pulmonologist and I would like her to pursue this followup as I suspect at least some of her dyspnea is related to weight, deconditioning and some chronic lung disease. If however she's not improved in our followup and is continuing to have chest discomfort she will need to have another cardiac catheterization.  DYSPNEA  As above  HYPERTENSION Her BP is slightly high.  She will continue the meds as listed. She needs significant weight loss for better blood pressure control.  OBESITY The patient understands the need to lose weight with diet and exercise. We have discussed specific strategies for this.

## 2013-01-25 NOTE — Patient Instructions (Signed)
Your physician has requested that you have an echocardiogram. Echocardiography is a painless test that uses sound waves to create images of your heart. It provides your doctor with information about the size and shape of your heart and how well your heart's chambers and valves are working. This procedure takes approximately one hour. There are no restrictions for this procedure.  Your physician has recommended you make the following change in your medication:  INCREASE ISOSORBIDE // IMDUR, TO 120 MG DAILY, USE CURRENT 60 MG TABS BY TAKE TWO PILLS AT ONCE TILL BOTTLE GONE THEN PICK UP NEW STRENGTH AT PHARMACY// YOU WILL THEN GO BACK TO 1 TABLET DAILY  Your physician recommends that you schedule a follow-up appointment in: [place] NP

## 2013-01-28 DIAGNOSIS — D638 Anemia in other chronic diseases classified elsewhere: Secondary | ICD-10-CM | POA: Diagnosis not present

## 2013-01-28 DIAGNOSIS — I129 Hypertensive chronic kidney disease with stage 1 through stage 4 chronic kidney disease, or unspecified chronic kidney disease: Secondary | ICD-10-CM | POA: Diagnosis not present

## 2013-01-28 DIAGNOSIS — D649 Anemia, unspecified: Secondary | ICD-10-CM | POA: Diagnosis not present

## 2013-01-28 DIAGNOSIS — N2581 Secondary hyperparathyroidism of renal origin: Secondary | ICD-10-CM | POA: Diagnosis not present

## 2013-01-28 DIAGNOSIS — N183 Chronic kidney disease, stage 3 unspecified: Secondary | ICD-10-CM | POA: Diagnosis not present

## 2013-01-28 DIAGNOSIS — M948X9 Other specified disorders of cartilage, unspecified sites: Secondary | ICD-10-CM | POA: Diagnosis not present

## 2013-02-02 DIAGNOSIS — E1129 Type 2 diabetes mellitus with other diabetic kidney complication: Secondary | ICD-10-CM | POA: Diagnosis not present

## 2013-02-02 DIAGNOSIS — I10 Essential (primary) hypertension: Secondary | ICD-10-CM | POA: Diagnosis not present

## 2013-02-02 DIAGNOSIS — N183 Chronic kidney disease, stage 3 unspecified: Secondary | ICD-10-CM | POA: Diagnosis not present

## 2013-02-02 DIAGNOSIS — E785 Hyperlipidemia, unspecified: Secondary | ICD-10-CM | POA: Diagnosis not present

## 2013-02-03 ENCOUNTER — Ambulatory Visit (INDEPENDENT_AMBULATORY_CARE_PROVIDER_SITE_OTHER): Payer: Medicare Other | Admitting: Pulmonary Disease

## 2013-02-03 ENCOUNTER — Encounter: Payer: Self-pay | Admitting: Pulmonary Disease

## 2013-02-03 VITALS — BP 120/68 | HR 86 | Temp 98.0°F | Ht 64.0 in | Wt 278.0 lb

## 2013-02-03 DIAGNOSIS — J45998 Other asthma: Secondary | ICD-10-CM

## 2013-02-03 DIAGNOSIS — R0609 Other forms of dyspnea: Secondary | ICD-10-CM

## 2013-02-03 DIAGNOSIS — R0600 Dyspnea, unspecified: Secondary | ICD-10-CM

## 2013-02-03 DIAGNOSIS — J449 Chronic obstructive pulmonary disease, unspecified: Secondary | ICD-10-CM | POA: Diagnosis not present

## 2013-02-03 DIAGNOSIS — J45909 Unspecified asthma, uncomplicated: Secondary | ICD-10-CM | POA: Diagnosis not present

## 2013-02-03 DIAGNOSIS — G4733 Obstructive sleep apnea (adult) (pediatric): Secondary | ICD-10-CM

## 2013-02-03 DIAGNOSIS — R0989 Other specified symptoms and signs involving the circulatory and respiratory systems: Secondary | ICD-10-CM

## 2013-02-03 NOTE — Assessment & Plan Note (Signed)
Advised her she does not need to use symbicort and dulera at the same time.  She feels symbicort works better, and will continue this.  She is to continue singulair at night and prn albuterol.

## 2013-02-03 NOTE — Progress Notes (Signed)
Chief Complaint  Patient presents with  . Asthma    Breathing is worse since last OV. Reports SOB, chest tightness and coughing.  . Sleep Apnea    Currently using CPAP machine every night. States that she might need a new mask. Denies problems with the machine or pressure.    CC: Joann Gutierrez, Joann Gutierrez  History of Present Illness: Joann Gutierrez is a 68 y.o. 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  She continues to have trouble with her breathing.  This can happen at rest, but usually when she is doing activity.  As a result she is not very active.  She gets occasional cough, and wheeze.  She does not usually have sputum.  She denies sinus congestion, but sometimes gets nose bleeds.  She is followed by Dr. Berna Gutierrez with rheumatology and is on methotrexate >> she thinks this is for rheumatoid arthritis, but she is not sure.  She is followed by Dr Joann Gutierrez for CKD.  She was recently seen by Dr. Minus Breeding, and has Echo scheduled for later this month.  She also had dose of imdur increased.  She is not sure if this has helped.  She continues to use CPAP with 2 liters oxygen at night.  TESTS: PSG 04/28/09 >> AHI 11, SpO2 low 68% Spirometry 07/05/10 >> FEV1 1.30 (64%), FEV1% 80 >> coughing during test Echo 05/21/11 >> EF 55 to 60%, mild LVH, mild/mod TR, PAS 45 mmHg ONO with CPAP and room air 10/22/12 >> Test time 10 hrs 59 min. Mean SpO2 92.4%, low SpO2 75%. Spent 16 min with SpO2 < 89%. RAST 12/11/12 >> negative, IgE 63.3  Sharunda A Glassner  has a past medical history of Allergy; Depression; Diabetes mellitus (1997); GERD (gastroesophageal reflux disease); Hypertension; Hyperlipidemia; Osteoarthritis; Osteoporosis; Hair loss; Anemia; CAD (coronary artery disease); CVA (cerebral infarction); Diverticulosis; Esophageal stricture; Gastritis; Adenomatous colon polyp; Sickle cell trait; Asthma; COPD (chronic obstructive  pulmonary disease); Secondary pulmonary hypertension (03/07/2009); Gastroparesis (08/21/2007); RENAL INSUFFICIENCY (02/16/2009); OSTEOARTHRITIS (08/09/2006); Morbid obesity; CHF (congestive heart failure); Carpal tunnel syndrome on left; PERIPHERAL NEUROPATHY, 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  [name]  has past surgical history that includes Abdominal hysterectomy; Replacement total knee (1998); Esophagogastroduodenoscopy (06/25/2006); ERD (08/08/2000); Artery Biopsy (01/07/2011); Breast lumpectomy; Breast lumpectomy; Hernia repair; Colonoscopy; Upper gastrointestinal endoscopy; and bil foot surgery.  Prior to Admission medications   Medication Sig Start Date End Date Taking? Authorizing Provider  albuterol (VENTOLIN HFA) 108 (90 BASE) MCG/ACT inhaler Inhale 2 puffs into the lungs every 6 (six) hours as needed for shortness of breath. 07/07/12  Yes [name], MD  amLODipine (NORVASC) 5 MG tablet Take 5 mg by mouth daily.  01/08/12  Yes Historical Provider, MD  amoxicillin-clavulanate (AUGMENTIN) 875-125 MG per tablet 2 (two) times daily.   Yes Historical Provider, MD  aspirin 81 MG tablet Take 81 mg by mouth daily.     Yes Historical Provider, MD  atorvastatin (LIPITOR) 40 MG tablet Take 1 tablet (40 mg total) by mouth daily. 09/07/10  Yes Minus Breeding, MD  budesonide-formoterol [facility]) 160-4.5 MCG/ACT inhaler Inhale 2 puffs into the lungs 2 (two) times daily. 07/07/12  Yes [name], MD  Calcium Carbonate-Vit D-Min (CALTRATE PLUS PO) Take 600 mg by mouth daily.     Yes Historical Provider, MD  cefdinir (OMNICEF) 300 MG capsule Take 1 capsule (300 mg total) by  mouth 2 (two) times daily. 10/19/12  Yes Tammy S Parrett, NP  citalopram (CELEXA) 20 MG tablet Take 20 mg by mouth daily.    Yes Historical Provider, MD  cyclobenzaprine (FLEXERIL) 10 MG tablet Take 1 tablet (10 mg total) by mouth daily. 04/29/11  Yes  Renato Shin, MD  diclofenac (VOLTAREN) 75 MG EC tablet Take 1 tablet by mouth Daily. 04/25/11  Yes Historical Provider, MD  dicyclomine (BENTYL) 10 MG capsule Take 1 tablet by mouth Three times a day before meals. 08/03/10  Yes Historical Provider, MD  diltiazem (CARDIZEM CD) 240 MG 24 hr capsule Take 240 mg by mouth daily.     Yes Historical Provider, MD  donepezil (ARICEPT) 5 MG tablet Take 10 mg by mouth daily. 04/29/11  Yes Renato Shin, MD  EASY TOUCH PEN NEEDLES 31G X 5 MM [place]  02/21/12  Yes Historical Provider, MD  ezetimibe (ZETIA) 10 MG tablet Take 10 mg by mouth daily.     Yes Historical Provider, MD  fluticasone (FLONASE) 50 MCG/ACT nasal spray Place 2 sprays into the nose daily. 01/16/12 01/15/13 Yes [name], MD  furosemide (LASIX) 80 MG tablet Take 80 mg by mouth 2 (two) times daily. 05/24/11  Yes Modena Jansky, MD  gabapentin (NEURONTIN) 300 MG capsule Take 1 capsule (300 mg total) by mouth 2 (two) times daily. 04/29/11  Yes Renato Shin, MD  hydrALAZINE (APRESOLINE) 50 MG tablet Take 1 tablet by mouth Daily. 06/21/11  Yes Historical Provider, MD  insulin glargine (LANTUS SOLOSTAR) 100 UNIT/ML injection Inject 30 Units into the skin every morning. And pen needles 1/day   Yes Historical Provider, MD  insulin NPH (HUMULIN N) 100 UNIT/ML injection Sliding scale with each meal 07/07/12  Yes Renato Shin, MD  Insulin Syringe-Needle U-100 (B-D INS SYR ULTRAFINE 1CC/31G) 31G X 5/16" 1 ML MISC Use as directecd to inject insulin 11/12/11  Yes Renato Shin, MD  KLOR-CON M20 20 MEQ tablet Take 1 tablet by mouth Daily. 06/19/11  Yes Historical Provider, MD  LIDODERM 5 % 1 patch once daily 10/20/11  Yes Historical Provider, MD  losartan-hydrochlorothiazide (HYZAAR) 100-25 MG per tablet Take 1 tablet by mouth daily. 07/19/11  Yes Cletus Gash, MD  metoCLOPramide (REGLAN) 10 MG tablet Take 1 tablet (10 mg total) by mouth 4 (four) times daily. 07/07/12  Yes Inda Castle, MD  metolazone (ZAROXOLYN)  2.5 MG tablet Take 1 tablet by mouth Daily. 06/19/11  Yes Historical Provider, MD  montelukast (SINGULAIR) 10 MG tablet Take 1 tablet (10 mg total) by mouth daily. 07/07/12  Yes [name], MD  Multiple Vitamin (MULTIVITAMIN PO) Take 1 tablet by mouth daily.     Yes Historical Provider, MD  omeprazole (PRILOSEC) 20 MG capsule Take 2 capsules (40 mg total) by mouth daily. 08/31/12  Yes Inda Castle, MD  raloxifene (EVISTA) 60 MG tablet Take 60 mg by mouth daily.     Yes Historical Provider, MD  sucralfate (CARAFATE) 1 GM/10ML suspension Take 10 mLs (1 g total) by mouth 2 (two) times daily. 07/07/12  Yes Inda Castle, MD  TraMADol HCl 50 MG TBSO Take 1 tablet by mouth 4 (four) times daily as needed.     Yes Historical Provider, MD    Allergies  Allergen Reactions  . Promethazine Hcl     REACTION: lethargy     Physical Exam:  General - No distress ENT - No sinus tenderness, no oral exudate, no LAN Cardiac - s1s2 regular, no murmur  Chest - No wheeze/rales/dullness Back - No focal tenderness Abd - Soft, non-tender Ext - No edema Neuro - Normal strength Skin - No rashes Psych - normal mood, and behavior   Assessment/Plan:  [name], MD [place] [facility]Sleep Pager:  [phone]

## 2013-02-03 NOTE — Assessment & Plan Note (Signed)
Multifactorial >> COPD/asthma, chronic Rt hemidiaphragm elevation, obesity with deconditioning and diastolic heart failure.  She is also on methotrexate for ?of rheumatoid arthritis >> will get copy of records from Dr. Lenna Gilford office.  Explained that connective tissue disease can contribute to respiratory disorders.  Will f/u results of Echo, and arrange for PFT's.  She may need additional chest imaging studies also.

## 2013-02-03 NOTE — Assessment & Plan Note (Signed)
She is to continue CPAP 9 cm H2O and 2 liters oxygen at night.

## 2013-02-03 NOTE — Patient Instructions (Signed)
Will schedule breathing test (PFT) Will get medical records from Dr. [name] Continue using symbicort two puffs twice per day, and rinse mouth after each use Continue singulair 10 mg pill nightly You can use proair two puffs up to four times per day as needed for cough, wheeze, or chest congestion Stop using dulera Follow up in 6 weeks

## 2013-02-04 ENCOUNTER — Ambulatory Visit (HOSPITAL_COMMUNITY): Payer: Medicare Other | Attending: Cardiology | Admitting: Radiology

## 2013-02-04 ENCOUNTER — Encounter: Payer: Self-pay | Admitting: Cardiology

## 2013-02-04 ENCOUNTER — Other Ambulatory Visit: Payer: Self-pay

## 2013-02-04 DIAGNOSIS — I10 Essential (primary) hypertension: Secondary | ICD-10-CM

## 2013-02-04 DIAGNOSIS — R011 Cardiac murmur, unspecified: Secondary | ICD-10-CM | POA: Insufficient documentation

## 2013-02-04 DIAGNOSIS — I2789 Other specified pulmonary heart diseases: Secondary | ICD-10-CM | POA: Insufficient documentation

## 2013-02-04 DIAGNOSIS — I509 Heart failure, unspecified: Secondary | ICD-10-CM | POA: Diagnosis not present

## 2013-02-04 DIAGNOSIS — J4489 Other specified chronic obstructive pulmonary disease: Secondary | ICD-10-CM | POA: Insufficient documentation

## 2013-02-04 DIAGNOSIS — I079 Rheumatic tricuspid valve disease, unspecified: Secondary | ICD-10-CM | POA: Insufficient documentation

## 2013-02-04 DIAGNOSIS — J449 Chronic obstructive pulmonary disease, unspecified: Secondary | ICD-10-CM | POA: Insufficient documentation

## 2013-02-04 DIAGNOSIS — I422 Other hypertrophic cardiomyopathy: Secondary | ICD-10-CM | POA: Diagnosis not present

## 2013-02-04 DIAGNOSIS — I421 Obstructive hypertrophic cardiomyopathy: Secondary | ICD-10-CM

## 2013-02-04 DIAGNOSIS — R079 Chest pain, unspecified: Secondary | ICD-10-CM

## 2013-02-04 NOTE — Progress Notes (Signed)
Echocardiogram performed.  

## 2013-02-11 DIAGNOSIS — H20029 Recurrent acute iridocyclitis, unspecified eye: Secondary | ICD-10-CM | POA: Diagnosis not present

## 2013-02-15 DIAGNOSIS — G5600 Carpal tunnel syndrome, unspecified upper limb: Secondary | ICD-10-CM | POA: Diagnosis not present

## 2013-02-15 DIAGNOSIS — M2550 Pain in unspecified joint: Secondary | ICD-10-CM | POA: Diagnosis not present

## 2013-02-15 DIAGNOSIS — M069 Rheumatoid arthritis, unspecified: Secondary | ICD-10-CM | POA: Diagnosis not present

## 2013-02-15 DIAGNOSIS — M1990 Unspecified osteoarthritis, unspecified site: Secondary | ICD-10-CM | POA: Diagnosis not present

## 2013-02-16 DIAGNOSIS — N183 Chronic kidney disease, stage 3 unspecified: Secondary | ICD-10-CM | POA: Diagnosis not present

## 2013-02-26 ENCOUNTER — Encounter: Payer: Self-pay | Admitting: *Deleted

## 2013-02-26 ENCOUNTER — Ambulatory Visit (INDEPENDENT_AMBULATORY_CARE_PROVIDER_SITE_OTHER): Payer: Medicare Other | Admitting: Cardiology

## 2013-02-26 ENCOUNTER — Encounter: Payer: Self-pay | Admitting: Cardiology

## 2013-02-26 VITALS — BP 145/83 | HR 76 | Ht 64.0 in | Wt 278.0 lb

## 2013-02-26 DIAGNOSIS — R079 Chest pain, unspecified: Secondary | ICD-10-CM | POA: Diagnosis not present

## 2013-02-26 DIAGNOSIS — Z01818 Encounter for other preprocedural examination: Secondary | ICD-10-CM

## 2013-02-26 DIAGNOSIS — Z79899 Other long term (current) drug therapy: Secondary | ICD-10-CM | POA: Diagnosis not present

## 2013-02-26 LAB — BASIC METABOLIC PANEL
BUN: 21 mg/dL (ref 6–23)
CHLORIDE: 108 meq/L (ref 96–112)
CO2: 29 mEq/L (ref 19–32)
Calcium: 8.9 mg/dL (ref 8.4–10.5)
Creatinine, Ser: 1.2 mg/dL (ref 0.4–1.2)
GFR: 60.39 mL/min (ref >60.00)
Glucose, Bld: 58 mg/dL — ABNORMAL LOW (ref 70–99)
POTASSIUM: 3.7 meq/L (ref 3.5–5.1)
Sodium: 143 mEq/L (ref 135–145)

## 2013-02-26 LAB — CBC WITH DIFFERENTIAL/PLATELET
BASOS ABS: 0 10*3/uL (ref 0.0–0.1)
Basophils Relative: 0.5 % (ref 0.0–3.0)
EOS ABS: 0.1 10*3/uL (ref 0.0–0.7)
Eosinophils Relative: 1.6 % (ref 0.0–5.0)
HEMATOCRIT: 30.6 % — AB (ref 36.0–46.0)
Hemoglobin: 9.4 g/dL — ABNORMAL LOW (ref 12.0–15.0)
LYMPHS ABS: 1.8 10*3/uL (ref 0.7–4.0)
Lymphocytes Relative: 30.1 % (ref 12.0–46.0)
MCHC: 30.5 g/dL (ref 30.0–36.0)
MCV: 75.9 fl — AB (ref 78.0–100.0)
MONO ABS: 0.5 10*3/uL (ref 0.1–1.0)
Monocytes Relative: 8.5 % (ref 3.0–12.0)
Neutro Abs: 3.6 10*3/uL (ref 1.4–7.7)
Neutrophils Relative %: 59.3 % (ref 43.0–77.0)
PLATELETS: 318 10*3/uL (ref 150.0–400.0)
RBC: 4.04 Mil/uL (ref 3.87–5.11)
RDW: 16.4 % — ABNORMAL HIGH (ref 11.5–14.6)
WBC: 6 10*3/uL (ref 4.5–10.5)

## 2013-02-26 LAB — PROTIME-INR
INR: 1.1 ratio — ABNORMAL HIGH (ref 0.8–1.0)
Prothrombin Time: 11.9 s (ref 10.2–12.4)

## 2013-02-26 NOTE — Progress Notes (Signed)
HPI The patient presents for evaluation of chest pain. His most recent visit Joann Gutierrez was complaining of some chest heaviness and dyspnea with exertion. Joann Gutierrez's had workups before with abnormal stress perfusion studies. However, on catheterization several years ago Joann Gutierrez had only minimal coronary plaque.  Joann Gutierrez stress perfusion study that was ordered last month demonstrated an EF of 66% with a moderate reversible perfusion defect in the territory of the PDA.  It was unclear whether this could be some diaphragmatic attenuation.  It was a moderate risk rather than high risk image. Rather than jumping into another invasive study I pursued medical management by increasing Joann Gutierrez Imdur to 120 mg daily. An echocardiogram Joann Gutierrez had a supernormal ejection fraction suggesting that Joann Gutierrez murmur was possibly dynamic.  Joann Gutierrez has also seen Dr. Halford Chessman a pulmonary work up for dyspnea is ongoing.    Joann Gutierrez is still getting short of breath. This seems to be the predominant complaint. Joann Gutierrez is also still having chest pain described as 8/10. Joann Gutierrez gets this with activity. Joann Gutierrez says it may take 15 minutes for it to clear up. Joann Gutierrez says Joann Gutierrez does get sweaty Joann Gutierrez does get nauseated. Joann Gutierrez's not describing PND or orthopnea. Joann Gutierrez's not sure whether Joann Gutierrez's ever had these symptoms before and whether this is the same as the symptoms Joann Gutierrez had at the time of Joann Gutierrez previous cath. Joann Gutierrez's not particularly active.    Allergies  Allergen Reactions  . Promethazine Hcl     REACTION: lethargy    Current Outpatient Prescriptions  Medication Sig Dispense Refill  . albuterol (VENTOLIN HFA) 108 (90 BASE) MCG/ACT inhaler Inhale 2 puffs into the lungs every 6 (six) hours as needed for shortness of breath.  1 Inhaler  6  . amLODipine (NORVASC) 5 MG tablet Take 5 mg by mouth daily.       Marland Kitchen aspirin 81 MG tablet Take 81 mg by mouth daily.        Marland Kitchen atorvastatin (LIPITOR) 40 MG tablet Take 1 tablet (40 mg total) by mouth daily.  30 tablet  6  . budesonide-formoterol (SYMBICORT)  160-4.5 MCG/ACT inhaler Inhale 2 puffs into the lungs 2 (two) times daily.  1 Inhaler  6  . Calcium Carbonate-Vit D-Min (CALTRATE PLUS PO) Take 600 mg by mouth daily.        . citalopram (CELEXA) 20 MG tablet Take 20 mg by mouth daily.       . cyclobenzaprine (FLEXERIL) 10 MG tablet Take 1 tablet (10 mg total) by mouth daily.  30 tablet  5  . diclofenac (VOLTAREN) 75 MG EC tablet Take 1 tablet by mouth Daily.      Marland Kitchen dicyclomine (BENTYL) 10 MG capsule Take 1 tablet by mouth Three times a day before meals.      Marland Kitchen diltiazem (CARDIZEM CD) 240 MG 24 hr capsule Take 240 mg by mouth daily.        Marland Kitchen donepezil (ARICEPT) 5 MG tablet Take 10 mg by mouth daily.      Marland Kitchen EASY TOUCH PEN NEEDLES 31G X 5 MM MISC       . ezetimibe (ZETIA) 10 MG tablet Take 10 mg by mouth daily.        . furosemide (LASIX) 80 MG tablet Take 80 mg by mouth 2 (two) times daily.      Marland Kitchen gabapentin (NEURONTIN) 300 MG capsule Take 1 capsule (300 mg total) by mouth 2 (two) times daily.  60 capsule  5  . hydrALAZINE (APRESOLINE) 50 MG tablet Take  100 mg by mouth Daily.       . insulin glargine (LANTUS SOLOSTAR) 100 UNIT/ML injection Inject 30 Units into the skin every morning. And pen needles 1/day      . insulin NPH (HUMULIN N) 100 UNIT/ML injection Sliding scale with each meal      . Insulin Syringe-Needle U-100 (B-D INS SYR ULTRAFINE 1CC/31G) 31G X 5/16" 1 ML MISC Use as directecd to inject insulin  100 each  5  . isosorbide mononitrate (IMDUR) 120 MG 24 hr tablet Take 1 tablet (120 mg total) by mouth daily.  30 tablet  6  . KLOR-CON M20 20 MEQ tablet Take 1 tablet by mouth Daily.      Marland Kitchen LIDODERM 5 % 1 patch once daily      . losartan-hydrochlorothiazide (HYZAAR) 100-25 MG per tablet Take 1 tablet by mouth daily.  30 tablet  11  . methotrexate (RHEUMATREX) 2.5 MG tablet       . metoCLOPramide (REGLAN) 10 MG tablet Take 1 tablet (10 mg total) by mouth 4 (four) times daily.  360 tablet  3  . metolazone (ZAROXOLYN) 2.5 MG tablet Take 1  tablet by mouth Daily.      . montelukast (SINGULAIR) 10 MG tablet Take 1 tablet (10 mg total) by mouth daily.  30 tablet  6  . Multiple Vitamin (MULTIVITAMIN PO) Take 1 tablet by mouth daily.        Marland Kitchen omeprazole (PRILOSEC) 20 MG capsule Take 2 capsules (40 mg total) by mouth daily.  180 capsule  3  . raloxifene (EVISTA) 60 MG tablet Take 60 mg by mouth daily.        . sucralfate (CARAFATE) 1 GM/10ML suspension Take 10 mLs (1 g total) by mouth 2 (two) times daily.  1260 mL  3  . TraMADol HCl 50 MG TBSO Take 1 tablet by mouth 4 (four) times daily as needed.        . fluticasone (FLONASE) 50 MCG/ACT nasal spray Place 2 sprays into the nose daily.  16 g  5   No current facility-administered medications for this visit.    Past Medical History  Diagnosis Date  . Allergy   . Depression   . Diabetes mellitus 1997    Type II   . GERD (gastroesophageal reflux disease)   . Hypertension   . Hyperlipidemia   . Osteoarthritis   . Osteoporosis   . Hair loss   . Anemia   . CAD (coronary artery disease)     Mild very minimal coronary disease with 20% obtuse marginal stenosis  . CVA (cerebral infarction)   . Diverticulosis   . Esophageal stricture   . Gastritis   . Adenomatous colon polyp   . Sickle cell trait   . Asthma        . COPD (chronic obstructive pulmonary disease)   . 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 Gastroparesis 08/21/2007  . RENAL INSUFFICIENCY 02/16/2009  . OSTEOARTHRITIS 08/09/2006  . Morbid obesity   . CHF (congestive heart failure)   . Carpal tunnel syndrome on left   . 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 Hernia, hiatal   . Seizures     pt thinks it has been several monthes since Joann Gutierrez had a seisure  . Stroke   . Hearing loss of both ears   . 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 Abdominal hysterectomy    .  Replacement total knee  1998    Left  . Esophagogastroduodenoscopy  06/25/2006   . Erd  08/08/2000  . Artery biopsy  01/07/2011    Procedure: MINOR BIOPSY TEMPORAL ARTERY;  Surgeon: Haywood Lasso, MD;  Location: [facility];  Service: General;  Laterality: Left;  left temporal artery biopsy  . Breast lumpectomy      benign  . Breast lumpectomy      both breast lumps removed   . Hernia repair    . Colonoscopy    . Upper gastrointestinal endoscopy    . Bil foot surgery      ROS:  As stated in the HPI and negative for all other systems.  PHYSICAL EXAM BP 145/83  Pulse 76  Ht 5\' 4"  (1.626 m)  Wt 278 lb (126.1 kg)  BMI 47.70 kg/m2 GENERAL:  Well appearing NECK:  No jugular venous distention, waveform within normal limits, carotid upstroke brisk and symmetric, no bruits, no thyromegaly LYMPHATICS:  No cervical, inguinal adenopathy LUNGS:  Clear to auscultation bilaterally BACK:  No CVA tenderness CHEST:  Unremarkable HEART:  PMI not displaced or sustained,S1 and S2 within normal limits, no S3, no S4, no clicks, no rubs, 2/6 apical systolic murmur heard best at the right upper sternal border and increasing slightly with a strain phase of Valsalvamurmurs ABD:  Flat, positive bowel sounds normal in frequency in pitch, no bruits, no rebound, no guarding, no midline pulsatile mass, no hepatomegaly, no splenomegaly, obese EXT:  2 plus pulses throughout, no edema, no cyanosis no clubbing SKIN:  No rashes no nodules  ASSESSMENT AND PLAN  CHEST PAIN Given the ongoing symptoms despite the increase in nitrates along with the abnormal stress test Joann Gutierrez will need a heart catheterization. With Joann Gutierrez dyspnea I will valuate with right and left heart catheterization. The patient understands that risks included but are not limited to stroke (1 in 1000), death (1 in 19), kidney failure [usually temporary] (1 in 500), bleeding (1 in 200), allergic reaction [possibly serious] (1 in 200).  The patient understands and agrees to proceed.  Joann Gutierrez did have this procedure before  in 2003 and Joann Gutierrez had no further questions.  DYSPNEA  As above  HYPERTENSION Joann Gutierrez BP is slightly.  Joann Gutierrez will continue the meds as listed. Joann Gutierrez needs significant weight loss for better blood pressure control.  OBESITY The patient understands the need to lose weight with diet and exercise. We have discussed specific strategies for this.

## 2013-02-26 NOTE — Patient Instructions (Addendum)
Will obtain labs today and call you with the results (bmet/cbc/pt/inr)  Your physician recommends that you continue on your current medications as directed. Please refer to the Current Medication list given to you today  You are scheduled for a right and left heart cath next Wednesday 03/03/13 at 8:30.  You need to go the [facility] [employer], arrive there at 6:15 and let them know you are there for a procedure.

## 2013-03-01 ENCOUNTER — Encounter (HOSPITAL_COMMUNITY): Payer: Self-pay | Admitting: Pharmacy Technician

## 2013-03-02 ENCOUNTER — Telehealth: Payer: Self-pay | Admitting: Cardiology

## 2013-03-02 ENCOUNTER — Other Ambulatory Visit: Payer: Self-pay | Admitting: Cardiology

## 2013-03-02 DIAGNOSIS — I350 Nonrheumatic aortic (valve) stenosis: Secondary | ICD-10-CM

## 2013-03-02 DIAGNOSIS — Z124 Encounter for screening for malignant neoplasm of cervix: Secondary | ICD-10-CM | POA: Diagnosis not present

## 2013-03-02 DIAGNOSIS — Z1231 Encounter for screening mammogram for malignant neoplasm of breast: Secondary | ICD-10-CM | POA: Diagnosis not present

## 2013-03-02 NOTE — Telephone Encounter (Signed)
New message    Need orders for cath on tomorrow

## 2013-03-02 NOTE — Telephone Encounter (Signed)
Sent meesage to Dr Percival Spanish to do cath orders for tomorrow

## 2013-03-03 ENCOUNTER — Encounter (HOSPITAL_COMMUNITY)
Admission: RE | Disposition: A | Discharge status: Home / Self Care | Payer: Self-pay | Source: Ambulatory Visit | Attending: Cardiology

## 2013-03-03 ENCOUNTER — Ambulatory Visit (HOSPITAL_COMMUNITY)
Admission: RE | Admit: 2013-03-03 | Discharge: 2013-03-03 | Disposition: A | Discharge status: Home / Self Care | Payer: Medicare Other | Source: Ambulatory Visit | Attending: Cardiology | Admitting: Cardiology

## 2013-03-03 DIAGNOSIS — M1990 Unspecified osteoarthritis, unspecified site: Secondary | ICD-10-CM | POA: Diagnosis not present

## 2013-03-03 DIAGNOSIS — Z7982 Long term (current) use of aspirin: Secondary | ICD-10-CM | POA: Insufficient documentation

## 2013-03-03 DIAGNOSIS — R569 Unspecified convulsions: Secondary | ICD-10-CM | POA: Insufficient documentation

## 2013-03-03 DIAGNOSIS — Z794 Long term (current) use of insulin: Secondary | ICD-10-CM | POA: Diagnosis not present

## 2013-03-03 DIAGNOSIS — G609 Hereditary and idiopathic neuropathy, unspecified: Secondary | ICD-10-CM | POA: Diagnosis not present

## 2013-03-03 DIAGNOSIS — M810 Age-related osteoporosis without current pathological fracture: Secondary | ICD-10-CM | POA: Insufficient documentation

## 2013-03-03 DIAGNOSIS — E785 Hyperlipidemia, unspecified: Secondary | ICD-10-CM | POA: Diagnosis not present

## 2013-03-03 DIAGNOSIS — E119 Type 2 diabetes mellitus without complications: Secondary | ICD-10-CM | POA: Diagnosis not present

## 2013-03-03 DIAGNOSIS — I2789 Other specified pulmonary heart diseases: Secondary | ICD-10-CM | POA: Diagnosis not present

## 2013-03-03 DIAGNOSIS — K5730 Diverticulosis of large intestine without perforation or abscess without bleeding: Secondary | ICD-10-CM | POA: Insufficient documentation

## 2013-03-03 DIAGNOSIS — D573 Sickle-cell trait: Secondary | ICD-10-CM | POA: Diagnosis not present

## 2013-03-03 DIAGNOSIS — F329 Major depressive disorder, single episode, unspecified: Secondary | ICD-10-CM | POA: Diagnosis not present

## 2013-03-03 DIAGNOSIS — K449 Diaphragmatic hernia without obstruction or gangrene: Secondary | ICD-10-CM | POA: Insufficient documentation

## 2013-03-03 DIAGNOSIS — K219 Gastro-esophageal reflux disease without esophagitis: Secondary | ICD-10-CM | POA: Diagnosis not present

## 2013-03-03 DIAGNOSIS — R079 Chest pain, unspecified: Secondary | ICD-10-CM | POA: Insufficient documentation

## 2013-03-03 DIAGNOSIS — J4489 Other specified chronic obstructive pulmonary disease: Secondary | ICD-10-CM | POA: Insufficient documentation

## 2013-03-03 DIAGNOSIS — I10 Essential (primary) hypertension: Secondary | ICD-10-CM | POA: Insufficient documentation

## 2013-03-03 DIAGNOSIS — R0609 Other forms of dyspnea: Secondary | ICD-10-CM | POA: Insufficient documentation

## 2013-03-03 DIAGNOSIS — F3289 Other specified depressive episodes: Secondary | ICD-10-CM | POA: Insufficient documentation

## 2013-03-03 DIAGNOSIS — Z8673 Personal history of transient ischemic attack (TIA), and cerebral infarction without residual deficits: Secondary | ICD-10-CM | POA: Diagnosis not present

## 2013-03-03 DIAGNOSIS — I2510 Atherosclerotic heart disease of native coronary artery without angina pectoris: Secondary | ICD-10-CM | POA: Insufficient documentation

## 2013-03-03 DIAGNOSIS — K222 Esophageal obstruction: Secondary | ICD-10-CM | POA: Insufficient documentation

## 2013-03-03 DIAGNOSIS — J449 Chronic obstructive pulmonary disease, unspecified: Secondary | ICD-10-CM | POA: Insufficient documentation

## 2013-03-03 DIAGNOSIS — G5600 Carpal tunnel syndrome, unspecified upper limb: Secondary | ICD-10-CM | POA: Insufficient documentation

## 2013-03-03 DIAGNOSIS — R0989 Other specified symptoms and signs involving the circulatory and respiratory systems: Secondary | ICD-10-CM | POA: Diagnosis not present

## 2013-03-03 DIAGNOSIS — H9190 Unspecified hearing loss, unspecified ear: Secondary | ICD-10-CM | POA: Insufficient documentation

## 2013-03-03 DIAGNOSIS — Z6841 Body Mass Index (BMI) 40.0 and over, adult: Secondary | ICD-10-CM | POA: Diagnosis not present

## 2013-03-03 DIAGNOSIS — I509 Heart failure, unspecified: Secondary | ICD-10-CM | POA: Diagnosis not present

## 2013-03-03 DIAGNOSIS — R0602 Shortness of breath: Secondary | ICD-10-CM | POA: Diagnosis not present

## 2013-03-03 DIAGNOSIS — I350 Nonrheumatic aortic (valve) stenosis: Secondary | ICD-10-CM

## 2013-03-03 HISTORY — PX: LEFT AND RIGHT HEART CATHETERIZATION WITH CORONARY ANGIOGRAM: SHX5449

## 2013-03-03 LAB — POCT I-STAT 3, VENOUS BLOOD GAS (G3P V)
ACID-BASE EXCESS: 2 mmol/L (ref 0.0–2.0)
Acid-Base Excess: 2 mmol/L (ref 0.0–2.0)
BICARBONATE: 26.4 meq/L — AB (ref 20.0–24.0)
Bicarbonate: 26.2 mEq/L — ABNORMAL HIGH (ref 20.0–24.0)
O2 SAT: 79 %
O2 Saturation: 67 %
PCO2 VEN: 38.1 mmHg — AB (ref 45.0–50.0)
TCO2: 28 mmol/L (ref 0–100)
TCO2: 27 mmol/L (ref 0–100)
pCO2, Ven: 41.6 mmHg — ABNORMAL LOW (ref 45.0–50.0)
pH, Ven: 7.41 — ABNORMAL HIGH (ref 7.250–7.300)
pH, Ven: 7.445 — ABNORMAL HIGH (ref 7.250–7.300)
pO2, Ven: 35 mmHg (ref 30.0–45.0)
pO2, Ven: 42 mmHg (ref 30.0–45.0)

## 2013-03-03 LAB — POCT I-STAT 3, ART BLOOD GAS (G3+)
Acid-Base Excess: 2 mmol/L (ref 0.0–2.0)
BICARBONATE: 26.5 meq/L — AB (ref 20.0–24.0)
O2 Saturation: 93 %
PH ART: 7.432 (ref 7.350–7.450)
TCO2: 28 mmol/L (ref 0–100)
pCO2 arterial: 39.7 mmHg (ref 35.0–45.0)
pO2, Arterial: 64 mmHg — ABNORMAL LOW (ref 80.0–100.0)

## 2013-03-03 LAB — GLUCOSE, CAPILLARY
GLUCOSE-CAPILLARY: 94 mg/dL (ref 70–99)
Glucose-Capillary: 104 mg/dL — ABNORMAL HIGH (ref 70–99)

## 2013-03-03 SURGERY — LEFT AND RIGHT HEART CATHETERIZATION WITH CORONARY ANGIOGRAM
Anesthesia: LOCAL

## 2013-03-03 MED ORDER — SODIUM CHLORIDE 0.9 % IV SOLN
INTRAVENOUS | Status: DC
  Administered 2013-03-03: 10:00:00 via INTRAVENOUS

## 2013-03-03 MED ORDER — FENTANYL CITRATE 0.05 MG/ML IJ SOLN
INTRAMUSCULAR | Status: AC
  Filled 2013-03-03: qty 2

## 2013-03-03 MED ORDER — ONDANSETRON HCL 4 MG/2ML IJ SOLN: 4.0000 mg | Freq: Four times a day (QID) | INTRAMUSCULAR | Status: DC | PRN

## 2013-03-03 MED ORDER — SODIUM CHLORIDE 0.9 % IV SOLN: 250.0000 mL | INTRAVENOUS | Status: DC | PRN

## 2013-03-03 MED ORDER — NITROGLYCERIN 0.4 MG/SPRAY TL SOLN
Status: AC
  Filled 2013-03-03: qty 4.9

## 2013-03-03 MED ORDER — LIDOCAINE HCL (PF) 1 % IJ SOLN
INTRAMUSCULAR | Status: AC
  Filled 2013-03-03: qty 30

## 2013-03-03 MED ORDER — FENTANYL CITRATE 0.05 MG/ML IJ SOLN
50.0000 ug | Freq: Once | INTRAMUSCULAR | Status: AC
  Administered 2013-03-03: 50 ug via INTRAVENOUS

## 2013-03-03 MED ORDER — MIDAZOLAM HCL 2 MG/2ML IJ SOLN
INTRAMUSCULAR | Status: AC
  Filled 2013-03-03: qty 2

## 2013-03-03 MED ORDER — SODIUM CHLORIDE 0.9 % IJ SOLN: 3.0000 mL | INTRAMUSCULAR | Status: DC | PRN

## 2013-03-03 MED ORDER — SODIUM CHLORIDE 0.9 % IJ SOLN: 3.0000 mL | Freq: Two times a day (BID) | INTRAMUSCULAR | Status: DC

## 2013-03-03 MED ORDER — NITROGLYCERIN 0.2 MG/ML ON CALL CATH LAB
INTRAVENOUS | Status: AC
  Filled 2013-03-03: qty 1

## 2013-03-03 MED ORDER — FENTANYL CITRATE 0.05 MG/ML IJ SOLN: 50.0000 ug | Freq: Once | INTRAMUSCULAR | Status: DC

## 2013-03-03 MED ORDER — ASPIRIN 81 MG PO CHEW
CHEWABLE_TABLET | ORAL | Status: AC
  Filled 2013-03-03: qty 1

## 2013-03-03 MED ORDER — NITROGLYCERIN 0.4 MG/SPRAY TL SOLN
1.0000 | Status: DC | PRN
  Administered 2013-03-03: 1 via SUBLINGUAL
  Filled 2013-03-03: qty 4.9

## 2013-03-03 MED ORDER — ACETAMINOPHEN 325 MG PO TABS: 650.0000 mg | ORAL_TABLET | ORAL | Status: DC | PRN

## 2013-03-03 MED ORDER — HEPARIN (PORCINE) IN NACL 2-0.9 UNIT/ML-% IJ SOLN
INTRAMUSCULAR | Status: AC
  Filled 2013-03-03: qty 1000

## 2013-03-03 MED ORDER — ASPIRIN 81 MG PO CHEW: 81.0000 mg | CHEWABLE_TABLET | ORAL | Status: DC

## 2013-03-03 MED ORDER — GI COCKTAIL ~~LOC~~
30.0000 mL | Freq: Once | ORAL | Status: AC
  Administered 2013-03-03: 30 mL via ORAL
  Filled 2013-03-03: qty 30

## 2013-03-03 MED ORDER — SODIUM CHLORIDE 0.9 % IV SOLN
INTRAVENOUS | Status: DC
  Administered 2013-03-03: 07:00:00 via INTRAVENOUS

## 2013-03-03 NOTE — Progress Notes (Signed)
[place] instruction given per MD order.  Pt and CG able to verbalize understanding.  Pt to car via wheelchair.

## 2013-03-03 NOTE — Interval H&P Note (Signed)
Cath Lab Visit (complete for each Cath Lab visit)  Clinical Evaluation Leading to the Procedure:   ACS: no  Non-ACS:    Anginal Classification: CCS IV  Anti-ischemic medical therapy: Maximal Therapy (2 or more classes of medications)  Non-Invasive Test Results: Intermediate-risk stress test findings: cardiac mortality 1-3%/year  Prior CABG: No previous CABG      History and Physical Interval Note:  03/03/2013 8:37 AM  Joann Gutierrez  has presented today for surgery, with the diagnosis of shortness of breath/abnormal stress  The various methods of treatment have been discussed with the patient and family. After consideration of risks, benefits and other options for treatment, the patient has consented to  Procedure(s): LEFT AND RIGHT HEART CATHETERIZATION WITH CORONARY ANGIOGRAM (N/A) as a surgical intervention .  The patient's history has been reviewed, patient examined, no change in status, stable for surgery.  I have reviewed the patient's chart and labs.  Questions were answered to the patient's satisfaction.     Minus Breeding

## 2013-03-03 NOTE — H&P (View-Only) (Signed)
HPI The patient presents for evaluation of chest pain. His most recent visit she was complaining of some chest heaviness and dyspnea with exertion. She's had workups before with abnormal stress perfusion studies. However, on catheterization several years ago she had only minimal coronary plaque.  Her stress perfusion study that was ordered last month demonstrated an EF of 66% with a moderate reversible perfusion defect in the territory of the PDA.  It was unclear whether this could be some diaphragmatic attenuation.  It was a moderate risk rather than high risk image. Rather than jumping into another invasive study I pursued medical management by increasing her Imdur to 120 mg daily. An echocardiogram she had a supernormal ejection fraction suggesting that her murmur was possibly dynamic.  She has also seen Dr. Halford Chessman a pulmonary work up for dyspnea is ongoing.    She is still getting short of breath. This seems to be the predominant complaint. She is also still having chest pain described as 8/10. She gets this with activity. She says it may take 15 minutes for it to clear up. She says she does get sweaty she does get nauseated. She's not describing PND or orthopnea. She's not sure whether she's ever had these symptoms before and whether this is the same as the symptoms she had at the time of her previous cath. She's not particularly active.    Allergies  Allergen Reactions  . Promethazine Hcl     REACTION: lethargy    Current Outpatient Prescriptions  Medication Sig Dispense Refill  . albuterol (VENTOLIN HFA) 108 (90 BASE) MCG/ACT inhaler Inhale 2 puffs into the lungs every 6 (six) hours as needed for shortness of breath.  1 Inhaler  6  . amLODipine (NORVASC) 5 MG tablet Take 5 mg by mouth daily.       Marland Kitchen aspirin 81 MG tablet Take 81 mg by mouth daily.        Marland Kitchen atorvastatin (LIPITOR) 40 MG tablet Take 1 tablet (40 mg total) by mouth daily.  30 tablet  6  . budesonide-formoterol (SYMBICORT)  160-4.5 MCG/ACT inhaler Inhale 2 puffs into the lungs 2 (two) times daily.  1 Inhaler  6  . Calcium Carbonate-Vit D-Min (CALTRATE PLUS PO) Take 600 mg by mouth daily.        . citalopram (CELEXA) 20 MG tablet Take 20 mg by mouth daily.       . cyclobenzaprine (FLEXERIL) 10 MG tablet Take 1 tablet (10 mg total) by mouth daily.  30 tablet  5  . diclofenac (VOLTAREN) 75 MG EC tablet Take 1 tablet by mouth Daily.      Marland Kitchen dicyclomine (BENTYL) 10 MG capsule Take 1 tablet by mouth Three times a day before meals.      Marland Kitchen diltiazem (CARDIZEM CD) 240 MG 24 hr capsule Take 240 mg by mouth daily.        Marland Kitchen donepezil (ARICEPT) 5 MG tablet Take 10 mg by mouth daily.      Marland Kitchen EASY TOUCH PEN NEEDLES 31G X 5 MM MISC       . ezetimibe (ZETIA) 10 MG tablet Take 10 mg by mouth daily.        . furosemide (LASIX) 80 MG tablet Take 80 mg by mouth 2 (two) times daily.      Marland Kitchen gabapentin (NEURONTIN) 300 MG capsule Take 1 capsule (300 mg total) by mouth 2 (two) times daily.  60 capsule  5  . hydrALAZINE (APRESOLINE) 50 MG tablet Take  100 mg by mouth Daily.       . insulin glargine (LANTUS SOLOSTAR) 100 UNIT/ML injection Inject 30 Units into the skin every morning. And pen needles 1/day      . insulin NPH (HUMULIN N) 100 UNIT/ML injection Sliding scale with each meal      . Insulin Syringe-Needle U-100 (B-D INS SYR ULTRAFINE 1CC/31G) 31G X 5/16" 1 ML MISC Use as directecd to inject insulin  100 each  5  . isosorbide mononitrate (IMDUR) 120 MG 24 hr tablet Take 1 tablet (120 mg total) by mouth daily.  30 tablet  6  . KLOR-CON M20 20 MEQ tablet Take 1 tablet by mouth Daily.      Marland Kitchen LIDODERM 5 % 1 patch once daily      . losartan-hydrochlorothiazide (HYZAAR) 100-25 MG per tablet Take 1 tablet by mouth daily.  30 tablet  11  . methotrexate (RHEUMATREX) 2.5 MG tablet       . metoCLOPramide (REGLAN) 10 MG tablet Take 1 tablet (10 mg total) by mouth 4 (four) times daily.  360 tablet  3  . metolazone (ZAROXOLYN) 2.5 MG tablet Take 1  tablet by mouth Daily.      . montelukast (SINGULAIR) 10 MG tablet Take 1 tablet (10 mg total) by mouth daily.  30 tablet  6  . Multiple Vitamin (MULTIVITAMIN PO) Take 1 tablet by mouth daily.        Marland Kitchen omeprazole (PRILOSEC) 20 MG capsule Take 2 capsules (40 mg total) by mouth daily.  180 capsule  3  . raloxifene (EVISTA) 60 MG tablet Take 60 mg by mouth daily.        . sucralfate (CARAFATE) 1 GM/10ML suspension Take 10 mLs (1 g total) by mouth 2 (two) times daily.  1260 mL  3  . TraMADol HCl 50 MG TBSO Take 1 tablet by mouth 4 (four) times daily as needed.        . fluticasone (FLONASE) 50 MCG/ACT nasal spray Place 2 sprays into the nose daily.  16 g  5   No current facility-administered medications for this visit.    Past Medical History  Diagnosis Date  . Allergy   . Depression   . Diabetes mellitus 1997    Type II   . GERD (gastroesophageal reflux disease)   . Hypertension   . Hyperlipidemia   . Osteoarthritis   . Osteoporosis   . Hair loss   . Anemia   . CAD (coronary artery disease)     Mild very minimal coronary disease with 20% obtuse marginal stenosis  . CVA (cerebral infarction)   . Diverticulosis   . Esophageal stricture   . Gastritis   . Adenomatous colon polyp   . Sickle cell trait   . Asthma        . COPD (chronic obstructive pulmonary disease)   . 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 Gastroparesis 08/21/2007  . RENAL INSUFFICIENCY 02/16/2009  . OSTEOARTHRITIS 08/09/2006  . Morbid obesity   . CHF (congestive heart failure)   . Carpal tunnel syndrome on left   . 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 Hernia, hiatal   . Seizures     pt thinks it has been several monthes since she had a seisure  . Stroke   . Hearing loss of both ears   . 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 Abdominal hysterectomy    .  Replacement total knee  1998    Left  . Esophagogastroduodenoscopy  06/25/2006   . Erd  08/08/2000  . Artery biopsy  01/07/2011    Procedure: MINOR BIOPSY TEMPORAL ARTERY;  Surgeon: Haywood Lasso, MD;  Location: [facility];  Service: General;  Laterality: Left;  left temporal artery biopsy  . Breast lumpectomy      benign  . Breast lumpectomy      both breast lumps removed   . Hernia repair    . Colonoscopy    . Upper gastrointestinal endoscopy    . Bil foot surgery      ROS:  As stated in the HPI and negative for all other systems.  PHYSICAL EXAM BP 145/83  Pulse 76  Ht 5\' 4"  (1.626 m)  Wt 278 lb (126.1 kg)  BMI 47.70 kg/m2 GENERAL:  Well appearing NECK:  No jugular venous distention, waveform within normal limits, carotid upstroke brisk and symmetric, no bruits, no thyromegaly LYMPHATICS:  No cervical, inguinal adenopathy LUNGS:  Clear to auscultation bilaterally BACK:  No CVA tenderness CHEST:  Unremarkable HEART:  PMI not displaced or sustained,S1 and S2 within normal limits, no S3, no S4, no clicks, no rubs, 2/6 apical systolic murmur heard best at the right upper sternal border and increasing slightly with a strain phase of Valsalvamurmurs ABD:  Flat, positive bowel sounds normal in frequency in pitch, no bruits, no rebound, no guarding, no midline pulsatile mass, no hepatomegaly, no splenomegaly, obese EXT:  2 plus pulses throughout, no edema, no cyanosis no clubbing SKIN:  No rashes no nodules  ASSESSMENT AND PLAN  CHEST PAIN Given the ongoing symptoms despite the increase in nitrates along with the abnormal stress test she will need a heart catheterization. With her dyspnea I will valuate with right and left heart catheterization. The patient understands that risks included but are not limited to stroke (1 in 1000), death (1 in 75), kidney failure [usually temporary] (1 in 500), bleeding (1 in 200), allergic reaction [possibly serious] (1 in 200).  The patient understands and agrees to proceed.  She did have this procedure before  in 2003 and she had no further questions.  DYSPNEA  As above  HYPERTENSION Her BP is slightly.  She will continue the meds as listed. She needs significant weight loss for better blood pressure control.  OBESITY The patient understands the need to lose weight with diet and exercise. We have discussed specific strategies for this.

## 2013-03-03 NOTE — Progress Notes (Signed)
Received pt alert and denies any discomfort at this time.  

## 2013-03-03 NOTE — Discharge Instructions (Signed)
Angiography, Care After Refer to this sheet in the next few weeks. These instructions provide you with information on caring for yourself after your procedure. Your health care provider may also give you more specific instructions. Your treatment has been planned according to current medical practices, but problems sometimes occur. Call your health care provider if you have any problems or questions after your procedure.  WHAT TO EXPECT AFTER THE PROCEDURE After your procedure, it is typical to have the following sensations:  Minor discomfort or tenderness and a small bump at the catheter insertion site. The bump should usually decrease in size and tenderness within 1 to 2 weeks.  Any bruising will usually fade within 2 to 4 weeks. HOME CARE INSTRUCTIONS   You may need to keep taking blood thinners if they were prescribed for you. Only take over-the-counter or prescription medicines for pain, fever, or discomfort as directed by your health care provider.  Do not apply powder or lotion to the site.  Do not sit in a bathtub, swimming pool, or whirlpool for 5 to 7 days.  You may shower 24 hours after the procedure. Remove the bandage (dressing) and gently wash the site with plain soap and water. Gently pat the site dry.  Inspect the site at least twice daily.  Limit your activity for the first 48 hours. Do not bend, squat, or lift anything over 20 lb (9 kg) or as directed by your health care provider.  Do not drive home if you are discharged the day of the procedure. Have someone else drive you. Follow instructions about when you can drive or return to work. SEEK MEDICAL CARE IF:  You get lightheaded when standing up.  You have drainage (other than a small amount of blood on the dressing).  You have chills.  You have a fever.  You have redness, warmth, swelling, or pain at the insertion site. SEEK IMMEDIATE MEDICAL CARE IF:   You develop chest pain or shortness of breath, feel faint,  or pass out.  You have bleeding, swelling larger than a walnut, or drainage from the catheter insertion site.  You develop pain, discoloration, coldness, or severe bruising in the leg or arm that held the catheter.  You develop bleeding from any other place, such as the bowels. You may see bright red blood in your urine or stools, or your stools may appear black and tarry.  You have heavy bleeding from the site. If this happens, hold pressure on the site. MAKE SURE YOU:  Understand these instructions.  Will watch your condition.  Will get help right away if you are not doing well or get worse.  Call 911 if bleeding is not under control Document Released: 07/26/2004 Document Revised: 09/09/2012 Document Reviewed: 06/01/2012 [facility] Patient Information 2014 [place].

## 2013-03-03 NOTE — CV Procedure (Signed)
   Cardiac Catheterization Procedure Note  Name: CORNELL BRUCKER MRN: [id number] DOB: 01-31-1945  Procedure: Right Heart Cath, Left Heart Cath, Selective Coronary Angiography, LV angiography  Indication:    Procedural Details: The right groin was prepped, draped, and anesthetized with 1% lidocaine. Using the modified Seldinger technique a 6 French sheath was placed in the right femoral artery and a 7 French sheath was placed in the right femoral vein. A Swan-Ganz catheter was used for the right heart catheterization. Standard protocol was followed for recording of right heart pressures and sampling of oxygen saturations. Fick cardiac output was calculated. Standard Judkins catheters were used for selective coronary angiography and left ventriculography. There were no immediate procedural complications. The patient was transferred to the post catheterization recovery area for further monitoring.  Procedural Findings: Hemodynamics:               RA 10    RV 38/7    PA 41/16  28    PCWP 13    LV 142/5    AO 137/76   Oxygen saturations:    PA 93    AO 67   Cardiac Output (Fick) 9                               Cardiac  Index (Fick) 4   Coronary angiography:  Coronary dominance: right  Left mainstem: Normal  Left anterior descending (LAD): Tortuous.  Mild luminal irregularities.  The vessel is large and wraps the apex.    Left circumflex (LCx): Tortuous and normal.   PL large and normal.  RI moderate sized with ostial 25% stenosis.    Right coronary artery (RCA): Large and normal.  PDA normal.   Left ventriculography: Left ventricular systolic function is normal, LVEF is estimated at 70%, there is no significant mitral regurgitation   Final Conclusions:  Mildly elevated pulmonary pressures.  NL LV function.  Minimal CAD.  Recommendations: The etiology of her chest pain does not appear to be cardiac.  Dyspnea is likely multifactorial but there is minimal evidence for diastolic  dysfunction as a primary contributor.  (Pulm pressure is upper limits of normal.)  No further work up is planned.     Minus Breeding 03/03/2013, 9:40 AM

## 2013-03-05 ENCOUNTER — Encounter: Payer: Self-pay | Admitting: Podiatry

## 2013-03-05 ENCOUNTER — Ambulatory Visit (INDEPENDENT_AMBULATORY_CARE_PROVIDER_SITE_OTHER): Payer: Medicare Other | Admitting: Podiatry

## 2013-03-05 VITALS — BP 172/69 | HR 87 | Ht 64.0 in | Wt 277.0 lb

## 2013-03-05 DIAGNOSIS — M79609 Pain in unspecified limb: Secondary | ICD-10-CM | POA: Diagnosis not present

## 2013-03-05 DIAGNOSIS — B351 Tinea unguium: Secondary | ICD-10-CM

## 2013-03-05 DIAGNOSIS — M79606 Pain in leg, unspecified: Secondary | ICD-10-CM | POA: Insufficient documentation

## 2013-03-05 DIAGNOSIS — E119 Type 2 diabetes mellitus without complications: Secondary | ICD-10-CM

## 2013-03-05 NOTE — Patient Instructions (Signed)
Seen for hypertrophic nails. No problems noted. All nails debrided. Return in 3 months or as needed.

## 2013-03-05 NOTE — Progress Notes (Signed)
Subjective:  68 y.o. year old diabetic female patient presents complaining of painful nails. Patient requests toe nails trimmed.  Stated that her blood sugar was 97 this morning.   Objective: Dermatologic:  Hypertrophic nails x 10.  No plantar lesions, no abnormal skin lesions noted.  Vascular:  All pedal pulses are palpable bilateral.  No pedal edema noted.  Orthopedic:  Mild hallux valgus with bunion on left foot.  Neurologic:  All epicritic and tactile sensations grossly intact.   Assessment:  Dystrophic mycotic nails x 10.  IDDM.  Painful feet.   Treatment: All mycotic nails debrided.  Return in 3 months or as needed.

## 2013-03-08 ENCOUNTER — Encounter: Payer: Self-pay | Admitting: Cardiology

## 2013-03-08 ENCOUNTER — Ambulatory Visit (INDEPENDENT_AMBULATORY_CARE_PROVIDER_SITE_OTHER): Payer: Medicare Other | Admitting: Cardiology

## 2013-03-08 VITALS — BP 90/42 | HR 100 | Ht 64.0 in | Wt 275.1 lb

## 2013-03-08 DIAGNOSIS — I10 Essential (primary) hypertension: Secondary | ICD-10-CM

## 2013-03-08 NOTE — Patient Instructions (Signed)
Your physician recommends that you continue on your current medications as directed. Please refer to the Current Medication list given to you today.   Your physician recommends that you schedule a follow-up appointment as needed  

## 2013-03-08 NOTE — Progress Notes (Signed)
HPI The patient presents for evaluation of chest pain. His most recent visit she was complaining of some chest heaviness and dyspnea with exertion. She's had workups before with abnormal stress perfusion studies. However, on catheterization several years ago she had only minimal coronary plaque.  Her stress perfusion study that was ordered last month demonstrated an EF of 66% with a moderate reversible perfusion defect in the territory of the PDA.  It was unclear whether this could be some diaphragmatic attenuation.  It was a moderate risk rather than high risk image. Rather than jumping into another invasive study I pursued medical management by increasing her Imdur to 120 mg daily. An echocardiogram she had a supernormal ejection fraction suggesting that her murmur was possibly dynamic.  She has also seen Dr. Halford Chessman a pulmonary work up for dyspnea is ongoing.   Given the fact that she was still getting short of breath with chest pain I sent her for a cath last week.  This demonstrated minimal CAD with near normal right heart pressures.  She did have significant chest pain following the procedure however.  Over the weekend she did not have further chest pain.  She had no problems with her groin cath site.  She is very tired as is her baseline and has unchanged dyspnea.   Allergies  Allergen Reactions  . Promethazine Hcl     REACTION: lethargy    Current Outpatient Prescriptions  Medication Sig Dispense Refill  . albuterol (PROVENTIL) (2.5 MG/3ML) 0.083% nebulizer solution Take 2.5 mg by nebulization every 6 (six) hours as needed for wheezing or shortness of breath.      Marland Kitchen albuterol (VENTOLIN HFA) 108 (90 BASE) MCG/ACT inhaler Inhale 2 puffs into the lungs every 6 (six) hours as needed for shortness of breath.  1 Inhaler  6  . aspirin 81 MG tablet Take 81 mg by mouth daily.        Marland Kitchen atorvastatin (LIPITOR) 40 MG tablet Take 1 tablet (40 mg total) by mouth daily.  30 tablet  6  .  budesonide-formoterol (SYMBICORT) 160-4.5 MCG/ACT inhaler Inhale 2 puffs into the lungs 2 (two) times daily.  1 Inhaler  6  . Calcium Carbonate-Vit D-Min (CALTRATE PLUS PO) Take 600 mg by mouth daily.        . citalopram (CELEXA) 20 MG tablet Take 20 mg by mouth daily.       . cyclobenzaprine (FLEXERIL) 10 MG tablet Take 1 tablet (10 mg total) by mouth daily.  30 tablet  5  . diclofenac (VOLTAREN) 75 MG EC tablet Take 1 tablet by mouth Daily.      Marland Kitchen diltiazem (CARDIZEM CD) 240 MG 24 hr capsule Take 240 mg by mouth daily.        Marland Kitchen donepezil (ARICEPT) 10 MG tablet Take 10 mg by mouth at bedtime.      . DULERA 100-5 MCG/ACT AERO       . EASY TOUCH PEN NEEDLES 31G X 5 MM MISC       . ezetimibe (ZETIA) 10 MG tablet Take 10 mg by mouth daily.        . furosemide (LASIX) 80 MG tablet Take 80 mg by mouth 2 (two) times daily.      Marland Kitchen gabapentin (NEURONTIN) 300 MG capsule Take 1 capsule (300 mg total) by mouth 2 (two) times daily.  60 capsule  5  . hydrALAZINE (APRESOLINE) 100 MG tablet Take 100 mg by mouth 3 (three) times daily.      Marland Kitchen  insulin glargine (LANTUS SOLOSTAR) 100 UNIT/ML injection Inject 30 Units into the skin every morning. And pen needles 1/day      . insulin NPH (HUMULIN N) 100 UNIT/ML injection Inject 2-13 Units into the skin 3 (three) times daily after meals. Sliding scale with each meal      . Insulin Syringe-Needle U-100 (B-D INS SYR ULTRAFINE 1CC/31G) 31G X 5/16" 1 ML MISC Use as directecd to inject insulin  100 each  5  . isosorbide mononitrate (IMDUR) 120 MG 24 hr tablet Take 1 tablet (120 mg total) by mouth daily.  30 tablet  6  . KLOR-CON M20 20 MEQ tablet Take 1 tablet by mouth Daily.      Marland Kitchen LIDODERM 5 % Place 1 patch onto the skin daily. 1 patch once daily      . losartan (COZAAR) 100 MG tablet Take 100 mg by mouth daily.      . methotrexate (RHEUMATREX) 2.5 MG tablet Take 10 mg by mouth once a week.       . metoCLOPramide (REGLAN) 10 MG tablet Take 1 tablet (10 mg total) by mouth  4 (four) times daily.  360 tablet  3  . metolazone (ZAROXOLYN) 2.5 MG tablet Take 1 tablet by mouth Daily.      . montelukast (SINGULAIR) 10 MG tablet Take 1 tablet (10 mg total) by mouth daily.  30 tablet  6  . Multiple Vitamin (MULTIVITAMIN PO) Take 1 tablet by mouth daily.        Marland Kitchen omeprazole (PRILOSEC) 20 MG capsule Take 20 mg by mouth daily.      . raloxifene (EVISTA) 60 MG tablet Take 60 mg by mouth daily.        . sucralfate (CARAFATE) 1 GM/10ML suspension Take 10 mLs (1 g total) by mouth 2 (two) times daily.  1260 mL  3  . TraMADol HCl 50 MG TBSO Take 1 tablet by mouth 4 (four) times daily as needed (for pain).       . fluticasone (FLONASE) 50 MCG/ACT nasal spray Place 2 sprays into the nose daily.  16 g  5   No current facility-administered medications for this visit.    Past Medical History  Diagnosis Date  . Allergy   . Depression   . Diabetes mellitus 1997    Type II   . GERD (gastroesophageal reflux disease)   . Hypertension   . Hyperlipidemia   . Osteoarthritis   . Osteoporosis   . Hair loss   . Anemia   . CAD (coronary artery disease)     Mild very minimal coronary disease with 20% obtuse marginal stenosis  . CVA (cerebral infarction)   . Diverticulosis   . Esophageal stricture   . Gastritis   . Adenomatous colon polyp   . Sickle cell trait   . Asthma        . COPD (chronic obstructive pulmonary disease)   . 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 Gastroparesis 08/21/2007  . RENAL INSUFFICIENCY 02/16/2009  . OSTEOARTHRITIS 08/09/2006  . Morbid obesity   . CHF (congestive heart failure)   . Carpal tunnel syndrome on left   . 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 Hernia, hiatal   . Seizures     pt thinks it has been several monthes since she had a seisure  . Stroke   . Hearing loss of both ears   . 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 Abdominal hysterectomy    . Replacement  total knee  1998    Left  . Esophagogastroduodenoscopy  06/25/2006  . Erd  08/08/2000  . Artery biopsy  01/07/2011    Procedure: MINOR BIOPSY TEMPORAL ARTERY;  Surgeon: Haywood Lasso, MD;  Location: [facility];  Service: General;  Laterality: Left;  left temporal artery biopsy  . Breast lumpectomy      benign  . Breast lumpectomy      both breast lumps removed   . Hernia repair    . Colonoscopy    . Upper gastrointestinal endoscopy    . Bil foot surgery      ROS:  As stated in the HPI and negative for all other systems.  PHYSICAL EXAM BP 90/42  Pulse 100  Ht 5\' 4"  (1.626 m)  Wt 275 lb 1.9 oz (124.794 kg)  BMI 47.20 kg/m2  SpO2 94% GENERAL:  Well appearing NECK:  No jugular venous distention, waveform within normal limits, carotid upstroke brisk and symmetric, no bruits, no thyromegaly LUNGS:  Clear to auscultation bilaterally HEART:  PMI not displaced or sustained,S1 and S2 within normal limits, no S3, no S4, no clicks, no rubs, 2/6 apical systolic murmur heard best at the right upper sternal border and increasing slightly with a strain phase of Valsalvamurmurs ABD:  Flat, positive bowel sounds normal in frequency in pitch, no bruits, no rebound, no guarding, no midline pulsatile mass, no hepatomegaly, no splenomegaly, obese EXT:  2 plus pulses throughout, no edema, no cyanosis no clubbing, no bleeding or bruising at the groin site.  SKIN:  No rashes no nodules  EKG:  Sinus rhythm, rate 98, axis within normal limits, intervals within normal limits, no acute ST-T wave changes.  03/08/2013   ASSESSMENT AND PLAN  CHEST PAIN The etiology of this is non cardiac.  I will refer her back to Kandice Hams, MD for further evaluation.    DYSPNEA  This is probably multifactorial and related to weight and deconditioning.  She is also undergoing by pulmonary for possible pulmonary etiology.   No further cardiac work up is indicated.   HYPERTENSION Her BP is slightly  low today which is unusual.  She will keep an eye on this.  She will continue the meds as listed.   OBESITY The patient understands the need to lose weight with diet and exercise. We have discussed specific strategies for this.

## 2013-03-17 ENCOUNTER — Ambulatory Visit (INDEPENDENT_AMBULATORY_CARE_PROVIDER_SITE_OTHER): Payer: Medicare Other | Admitting: Pulmonary Disease

## 2013-03-17 ENCOUNTER — Encounter: Payer: Self-pay | Admitting: Pulmonary Disease

## 2013-03-17 VITALS — BP 142/72 | HR 88 | Ht 64.0 in | Wt 275.0 lb

## 2013-03-17 DIAGNOSIS — J45909 Unspecified asthma, uncomplicated: Secondary | ICD-10-CM | POA: Diagnosis not present

## 2013-03-17 DIAGNOSIS — R0609 Other forms of dyspnea: Secondary | ICD-10-CM

## 2013-03-17 DIAGNOSIS — J449 Chronic obstructive pulmonary disease, unspecified: Secondary | ICD-10-CM

## 2013-03-17 DIAGNOSIS — J9610 Chronic respiratory failure, unspecified whether with hypoxia or hypercapnia: Secondary | ICD-10-CM

## 2013-03-17 DIAGNOSIS — G4733 Obstructive sleep apnea (adult) (pediatric): Secondary | ICD-10-CM

## 2013-03-17 DIAGNOSIS — J45998 Other asthma: Secondary | ICD-10-CM

## 2013-03-17 DIAGNOSIS — R0600 Dyspnea, unspecified: Secondary | ICD-10-CM

## 2013-03-17 DIAGNOSIS — R0989 Other specified symptoms and signs involving the circulatory and respiratory systems: Secondary | ICD-10-CM

## 2013-03-17 LAB — PULMONARY FUNCTION TEST
DL/VA % pred: 101 %
DL/VA: 4.88 ml/min/mmHg/L
DLCO UNC % PRED: 57 %
DLCO UNC: 13.97 ml/min/mmHg
FEF 25-75 Post: 1.63 L/sec
FEF 25-75 Pre: 1.1 L/sec
FEF2575-%Change-Post: 47 %
FEF2575-%Pred-Post: 92 %
FEF2575-%Pred-Pre: 62 %
FEV1-%CHANGE-POST: 10 %
FEV1-%PRED-POST: 67 %
FEV1-%Pred-Pre: 60 %
FEV1-Post: 1.27 L
FEV1-Pre: 1.15 L
FEV1FVC-%Change-Post: 5 %
FEV1FVC-%Pred-Pre: 102 %
FEV6-%Change-Post: 7 %
FEV6-%PRED-PRE: 59 %
FEV6-%Pred-Post: 64 %
FEV6-Post: 1.51 L
FEV6-Pre: 1.4 L
FEV6FVC-%CHANGE-POST: 2 %
FEV6FVC-%PRED-POST: 104 %
FEV6FVC-%PRED-PRE: 101 %
FVC-%Change-Post: 4 %
FVC-%PRED-PRE: 58 %
FVC-%Pred-Post: 61 %
FVC-PRE: 1.44 L
FVC-Post: 1.51 L
PRE FEV6/FVC RATIO: 98 %
Post FEV1/FVC ratio: 84 %
Post FEV6/FVC ratio: 100 %
Pre FEV1/FVC ratio: 80 %
RV % PRED: 100 %
RV: 2.16 L
TLC % pred: 80 %
TLC: 4.06 L

## 2013-03-17 NOTE — Progress Notes (Signed)
Chief Complaint  Patient presents with  . Asthma    Breathing is unchanged. Still reports SOB, chest tightness and coughing . Denies wheezing. PFT was done today.    CC: Joann Gutierrez, Joann Gutierrez  History of Present Illness: Joann Gutierrez is a 68 y.o. 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  She is here to review her PFT.  This showed combined obstruction and restrictive defect.  She also had Lt and Rt heart catheterization earlier this month.  She continues to get winded with activity.  She gets occasional cough.  She notices that she coughs up bitter phlegm from her stomach sometimes.  She continues to use CPAP with 2 liters oxygen at night.  TESTS: PSG 04/28/09 >> AHI 11, SpO2 low 68% Spirometry 07/05/10 >> FEV1 1.30 (64%), FEV1% 80 >> coughing during test Echo 05/21/11 >> EF 55 to 60%, mild LVH, mild/mod TR, PAS 45 mmHg ONO with CPAP and room air 10/22/12 >> Test time 10 hrs 59 min. Mean SpO2 92.4%, low SpO2 75%. Spent 16 min with SpO2 < 89%. RAST 12/11/12 >> negative, IgE 63.3 Echo 02/04/13 >> mod LVH, EF 65 to [id number], grade 1 diastolic dysfx, PAS 42 mmHg Lt, Rt Heart cath 03/03/13 >> RA 10, RV 38/7, PA 41/16 28, PCWP 13, LV 142/5, AO 137/76 PFT 03/17/13 >> FEV1 1.27 (67%), FEV1% 84, TLC 4.06 (80%), DLCO 57%, borderline BD  Joann Gutierrez  has a past medical history of Allergy; Depression; Diabetes mellitus (1997); GERD (gastroesophageal reflux disease); Hypertension; Hyperlipidemia; Osteoarthritis; Osteoporosis; Hair loss; Anemia; CAD (coronary artery disease); CVA (cerebral infarction); Diverticulosis; Esophageal stricture; Gastritis; Adenomatous colon polyp; Sickle cell trait; Asthma; COPD (chronic obstructive pulmonary disease); Secondary pulmonary hypertension (03/07/2009); Gastroparesis (08/21/2007); RENAL INSUFFICIENCY (02/16/2009); OSTEOARTHRITIS (08/09/2006); Morbid obesity; CHF (congestive heart failure); Carpal tunnel  syndrome on left; PERIPHERAL NEUROPATHY, 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  Joann Gutierrez  has past surgical history that includes Abdominal hysterectomy; Replacement total knee (1998); Esophagogastroduodenoscopy (06/25/2006); ERD (08/08/2000); Artery Biopsy (01/07/2011); Breast lumpectomy; Breast lumpectomy; Hernia repair; Colonoscopy; Upper gastrointestinal endoscopy; and bil foot surgery.  Prior to Admission medications   Medication Sig Start Date End Date Taking? Authorizing Provider  albuterol (VENTOLIN HFA) 108 (90 BASE) MCG/ACT inhaler Inhale 2 puffs into the lungs every 6 (six) hours as needed for shortness of breath. 07/07/12  Yes [name], MD  amLODipine (NORVASC) 5 MG tablet Take 5 mg by mouth daily.  01/08/12  Yes Historical Provider, MD  amoxicillin-clavulanate (AUGMENTIN) 875-125 MG per tablet 2 (two) times daily.   Yes Historical Provider, MD  aspirin 81 MG tablet Take 81 mg by mouth daily.     Yes Historical Provider, MD  atorvastatin (LIPITOR) 40 MG tablet Take 1 tablet (40 mg total) by mouth daily. 09/07/10  Yes Minus Breeding, MD  budesonide-formoterol [facility]) 160-4.5 MCG/ACT inhaler Inhale 2 puffs into the lungs 2 (two) times daily. 07/07/12  Yes [name], MD  Calcium Carbonate-Vit D-Min (CALTRATE PLUS PO) Take 600 mg by mouth daily.     Yes Historical Provider, MD  cefdinir (OMNICEF) 300 MG capsule Take 1 capsule (300 mg total) by mouth 2 (two) times daily. 10/19/12  Yes Tammy S Parrett, NP  citalopram (CELEXA) 20 MG tablet Take 20 mg by mouth daily.    Yes Historical Provider, MD  cyclobenzaprine (FLEXERIL) 10 MG tablet Take 1 tablet (10 mg total) by mouth  daily. 04/29/11  Yes Renato Shin, MD  diclofenac (VOLTAREN) 75 MG EC tablet Take 1 tablet by mouth Daily. 04/25/11  Yes Historical Provider, MD  dicyclomine (BENTYL) 10 MG capsule Take 1 tablet by mouth Three times a day before meals.  08/03/10  Yes Historical Provider, MD  diltiazem (CARDIZEM CD) 240 MG 24 hr capsule Take 240 mg by mouth daily.     Yes Historical Provider, MD  donepezil (ARICEPT) 5 MG tablet Take 10 mg by mouth daily. 04/29/11  Yes Renato Shin, MD  EASY TOUCH PEN NEEDLES 31G X 5 MM [place]  02/21/12  Yes Historical Provider, MD  ezetimibe (ZETIA) 10 MG tablet Take 10 mg by mouth daily.     Yes Historical Provider, MD  fluticasone (FLONASE) 50 MCG/ACT nasal spray Place 2 sprays into the nose daily. 01/16/12 01/15/13 Yes [name], MD  furosemide (LASIX) 80 MG tablet Take 80 mg by mouth 2 (two) times daily. 05/24/11  Yes Modena Jansky, MD  gabapentin (NEURONTIN) 300 MG capsule Take 1 capsule (300 mg total) by mouth 2 (two) times daily. 04/29/11  Yes Renato Shin, MD  hydrALAZINE (APRESOLINE) 50 MG tablet Take 1 tablet by mouth Daily. 06/21/11  Yes Historical Provider, MD  insulin glargine (LANTUS SOLOSTAR) 100 UNIT/ML injection Inject 30 Units into the skin every morning. And pen needles 1/day   Yes Historical Provider, MD  insulin NPH (HUMULIN N) 100 UNIT/ML injection Sliding scale with each meal 07/07/12  Yes Renato Shin, MD  Insulin Syringe-Needle U-100 (B-D INS SYR ULTRAFINE 1CC/31G) 31G X 5/16" 1 ML MISC Use as directecd to inject insulin 11/12/11  Yes Renato Shin, MD  KLOR-CON M20 20 MEQ tablet Take 1 tablet by mouth Daily. 06/19/11  Yes Historical Provider, MD  LIDODERM 5 % 1 patch once daily 10/20/11  Yes Historical Provider, MD  losartan-hydrochlorothiazide (HYZAAR) 100-25 MG per tablet Take 1 tablet by mouth daily. 07/19/11  Yes Cletus Gash, MD  metoCLOPramide (REGLAN) 10 MG tablet Take 1 tablet (10 mg total) by mouth 4 (four) times daily. 07/07/12  Yes Inda Castle, MD  metolazone (ZAROXOLYN) 2.5 MG tablet Take 1 tablet by mouth Daily. 06/19/11  Yes Historical Provider, MD  montelukast (SINGULAIR) 10 MG tablet Take 1 tablet (10 mg total) by mouth daily. 07/07/12  Yes [name], MD  Multiple Vitamin  (MULTIVITAMIN PO) Take 1 tablet by mouth daily.     Yes Historical Provider, MD  omeprazole (PRILOSEC) 20 MG capsule Take 2 capsules (40 mg total) by mouth daily. 08/31/12  Yes Inda Castle, MD  raloxifene (EVISTA) 60 MG tablet Take 60 mg by mouth daily.     Yes Historical Provider, MD  sucralfate (CARAFATE) 1 GM/10ML suspension Take 10 mLs (1 g total) by mouth 2 (two) times daily. 07/07/12  Yes Inda Castle, MD  TraMADol HCl 50 MG TBSO Take 1 tablet by mouth 4 (four) times daily as needed.     Yes Historical Provider, MD    Allergies  Allergen Reactions  . Promethazine Hcl     REACTION: lethargy     Physical Exam:  General - No distress ENT - No sinus tenderness, no oral exudate, no LAN Cardiac - s1s2 regular, no murmur Chest - decreased breath sounds Rt base, no wheeze Back - No focal tenderness Abd - Soft, non-tender Ext - No edema Neuro - Normal strength Skin - No rashes Psych - normal mood, and behavior   Assessment/Plan:  [name], MD [facility] Pager:  [phone]

## 2013-03-17 NOTE — Assessment & Plan Note (Signed)
Related to COPD with asthma, and chronic Rt hemidiaphragm elevation.

## 2013-03-17 NOTE — Assessment & Plan Note (Signed)
She is to continue CPAP 9 cm H2O and 2 liters oxygen at night.

## 2013-03-17 NOTE — Assessment & Plan Note (Signed)
She is to continue symbicort, singulair, and prn albuterol

## 2013-03-17 NOTE — Progress Notes (Signed)
PFT done today. 

## 2013-03-17 NOTE — Assessment & Plan Note (Addendum)
Multifactorial >> COPD/asthma, chronic Rt hemidiaphragm elevation, obesity with deconditioning and diastolic heart failure.  Will arrange for referral to pulmonary rehab.

## 2013-03-17 NOTE — Patient Instructions (Signed)
Will arrange for referral to pulmonary rehab  Follow up in 6 months 

## 2013-03-29 ENCOUNTER — Telehealth: Payer: Self-pay | Admitting: Pulmonary Disease

## 2013-03-29 NOTE — Telephone Encounter (Signed)
I called and made pt aware it takes about 4-6 weeks to hear fro pulm rehab. Nothing further needed

## 2013-03-31 ENCOUNTER — Telehealth: Payer: Self-pay | Admitting: Gastroenterology

## 2013-03-31 NOTE — Telephone Encounter (Signed)
Pt states she is having difficulty swallowing and it feels like her pills are getting stuck sometimes. Pt also states she has a bad bitter taste in her mouth in the am when she wakes up. Pt scheduled to see Dr. Deatra Ina 04/02/13@10am . Pt aware of appt.

## 2013-04-02 ENCOUNTER — Encounter: Payer: Self-pay | Admitting: Gastroenterology

## 2013-04-02 ENCOUNTER — Ambulatory Visit (INDEPENDENT_AMBULATORY_CARE_PROVIDER_SITE_OTHER)
Admission: RE | Admit: 2013-04-02 | Discharge: 2013-04-02 | Disposition: A | Discharge status: Home / Self Care | Payer: Medicare Other | Source: Ambulatory Visit | Attending: Gastroenterology | Admitting: Gastroenterology

## 2013-04-02 ENCOUNTER — Ambulatory Visit (INDEPENDENT_AMBULATORY_CARE_PROVIDER_SITE_OTHER): Payer: Medicare Other | Admitting: Gastroenterology

## 2013-04-02 VITALS — BP 118/50 | HR 100 | Ht 64.0 in | Wt 269.2 lb

## 2013-04-02 DIAGNOSIS — R0602 Shortness of breath: Secondary | ICD-10-CM | POA: Diagnosis not present

## 2013-04-02 DIAGNOSIS — R042 Hemoptysis: Secondary | ICD-10-CM

## 2013-04-02 DIAGNOSIS — R079 Chest pain, unspecified: Secondary | ICD-10-CM | POA: Diagnosis not present

## 2013-04-02 NOTE — Assessment & Plan Note (Signed)
She claims to have seen blood in her saliva and thinks that she has coughed up small amounts of blood as well.  Recommendations #1 chest x-ray

## 2013-04-02 NOTE — Progress Notes (Signed)
          History of Present Illness:  The patient has returned because of complaints of dysphagia.  She has a history of an esophageal stricture and was dilated approximately one year ago.  She has recurrent dysphagia to solids and now liquids.  She also has a history of gastroparesis for which she has been taking Reglan with success.  On a couple of occasions she has noted blood in her saliva.  She's had a nonproductive cough but feels that she has coughed up some blood as well.   Review of Systems: Pertinent positive and negative review of systems were noted in the above HPI section. All other review of systems were otherwise negative.    Current Medications, Allergies, Past Medical History, Past Surgical History, Family History and Social History were reviewed in [facility] record  Vital signs were reviewed in today's medical record. Physical Exam: General: Well developed , well nourished, no acute distress Skin: anicteric Head: Normocephalic and atraumatic Eyes:  sclerae anicteric, EOMI Ears: Normal auditory acuity Mouth: No deformity or lesions Lungs: Clear throughout to auscultation Heart: Regular rate and rhythm; no murmurs, rubs or bruits Abdomen: Soft, non tender and non distended. No masses, hepatosplenomegaly or hernias noted. Normal Bowel sounds Rectal:deferred Musculoskeletal: Symmetrical with no gross deformities  Pulses:  Normal pulses noted Extremities: No clubbing, cyanosis,  or deformities noted.  There is 2+ pedal edema Neurological: Alert oriented x 4, grossly nonfocal Psychological:  Alert and cooperative. Normal mood and affect  See Assessment and Plan under Problem List

## 2013-04-02 NOTE — Patient Instructions (Signed)
Go to the basement for your chest xray  You have been scheduled for an endoscopy with propofol. Please follow written instructions given to you at your visit today. If you use inhalers (even only as needed), please bring them with you on the day of your procedure. Your physician has requested that you go to www.[email or web address].[email or web address] and enter the access code given to you at your visit today. This web site gives a general overview about your procedure. However, you should still follow specific instructions given to you by our office regarding your preparation for the procedure.

## 2013-04-02 NOTE — Assessment & Plan Note (Signed)
Patient has recurrent symptoms of dysphasia which likely represent stricture in her esophagus.   Recommendations #1 upper endoscopy with dilation as indicated

## 2013-04-02 NOTE — Progress Notes (Signed)
Quick Note:  Please inform the patient that lab work was normal and to continue current plan of action ______ 

## 2013-04-02 NOTE — Assessment & Plan Note (Signed)
Symptoms are well controlled with Reglan.  She will consider enrollment in a gastroparesis trial.

## 2013-04-07 ENCOUNTER — Encounter (HOSPITAL_COMMUNITY)
Admission: RE | Admit: 2013-04-07 | Discharge: 2013-04-07 | Disposition: A | Discharge status: Home / Self Care | Payer: Medicare Other | Source: Ambulatory Visit | Attending: Pulmonary Disease | Admitting: Pulmonary Disease

## 2013-04-07 DIAGNOSIS — M25579 Pain in unspecified ankle and joints of unspecified foot: Secondary | ICD-10-CM | POA: Insufficient documentation

## 2013-04-07 DIAGNOSIS — G4733 Obstructive sleep apnea (adult) (pediatric): Secondary | ICD-10-CM | POA: Diagnosis not present

## 2013-04-07 DIAGNOSIS — Z5189 Encounter for other specified aftercare: Secondary | ICD-10-CM | POA: Insufficient documentation

## 2013-04-07 DIAGNOSIS — N183 Chronic kidney disease, stage 3 unspecified: Secondary | ICD-10-CM | POA: Insufficient documentation

## 2013-04-07 DIAGNOSIS — N058 Unspecified nephritic syndrome with other morphologic changes: Secondary | ICD-10-CM | POA: Diagnosis not present

## 2013-04-07 DIAGNOSIS — M79609 Pain in unspecified limb: Secondary | ICD-10-CM | POA: Diagnosis not present

## 2013-04-07 DIAGNOSIS — I129 Hypertensive chronic kidney disease with stage 1 through stage 4 chronic kidney disease, or unspecified chronic kidney disease: Secondary | ICD-10-CM | POA: Insufficient documentation

## 2013-04-07 DIAGNOSIS — E1029 Type 1 diabetes mellitus with other diabetic kidney complication: Secondary | ICD-10-CM | POA: Diagnosis not present

## 2013-04-07 DIAGNOSIS — E7800 Pure hypercholesterolemia, unspecified: Secondary | ICD-10-CM | POA: Insufficient documentation

## 2013-04-07 DIAGNOSIS — E669 Obesity, unspecified: Secondary | ICD-10-CM | POA: Diagnosis not present

## 2013-04-07 DIAGNOSIS — J9610 Chronic respiratory failure, unspecified whether with hypoxia or hypercapnia: Secondary | ICD-10-CM | POA: Insufficient documentation

## 2013-04-07 NOTE — Progress Notes (Addendum)
Joann Gutierrez arrived to Pulmonary Rehab today via wheelchair for orientation. She is accompanied bhy her husband.  Obese, neatly dressed, appearing her stated age. Very pleasant.  She does have minor hearing difficulty and admits to havinig some concentration issues.  She has history of small strokes in past.   Skin is warm and dry, gait a little unsteady.  She does use a cane at home, has previouly used walker. She and her husband were oriented to the Pulmonary Rehab program and program expectations and guidelines reviewed. Medical history and medications reviewed with patient and her husband.Grips are equal but decreased, no peripheral edema noted, +2-+3 dorsalis pulse bilateral.  Breath sounds distant but clear, heart rate is regular.  Safety and hand hygiene in the exercise area reviewed with patient.  Patient voices understanding.Demonstration and practice of PLB using pulse oximeter.  Patient able to return demonstration satisfactory. She plans to have walk test done on Monday 04/14/2013 and begin exercise on e/26/ 2015.  We look forward to working with this nice lady to improve her QOL .   11:50 am to 1:05 pm [place]

## 2013-04-08 ENCOUNTER — Encounter: Payer: Self-pay | Admitting: Gastroenterology

## 2013-04-08 ENCOUNTER — Ambulatory Visit: Payer: Medicare Other | Admitting: Gastroenterology

## 2013-04-08 VITALS — BP 129/67 | HR 90 | Temp 98.0°F | Resp 33 | Ht 64.0 in | Wt 269.0 lb

## 2013-04-08 DIAGNOSIS — D649 Anemia, unspecified: Secondary | ICD-10-CM | POA: Diagnosis not present

## 2013-04-08 DIAGNOSIS — R1310 Dysphagia, unspecified: Secondary | ICD-10-CM | POA: Diagnosis not present

## 2013-04-08 DIAGNOSIS — K222 Esophageal obstruction: Secondary | ICD-10-CM | POA: Diagnosis not present

## 2013-04-08 DIAGNOSIS — K3184 Gastroparesis: Secondary | ICD-10-CM | POA: Diagnosis not present

## 2013-04-08 DIAGNOSIS — E669 Obesity, unspecified: Secondary | ICD-10-CM | POA: Diagnosis not present

## 2013-04-08 DIAGNOSIS — E119 Type 2 diabetes mellitus without complications: Secondary | ICD-10-CM | POA: Diagnosis not present

## 2013-04-08 DIAGNOSIS — G4733 Obstructive sleep apnea (adult) (pediatric): Secondary | ICD-10-CM | POA: Diagnosis not present

## 2013-04-08 DIAGNOSIS — K219 Gastro-esophageal reflux disease without esophagitis: Secondary | ICD-10-CM | POA: Diagnosis not present

## 2013-04-08 LAB — GLUCOSE, CAPILLARY
GLUCOSE-CAPILLARY: 156 mg/dL — AB (ref 70–99)
Glucose-Capillary: 144 mg/dL — ABNORMAL HIGH (ref 70–99)

## 2013-04-08 MED ORDER — SODIUM CHLORIDE 0.9 % IV SOLN: 500.0000 mL | INTRAVENOUS | Status: DC

## 2013-04-08 NOTE — Patient Instructions (Addendum)
YOU HAD AN ENDOSCOPIC PROCEDURE TODAY AT [facility]: Refer to the procedure report that was given to you for any specific questions about what was found during the examination.  If the procedure report does not answer your questions, please call your gastroenterologist to clarify.  If you requested that your care partner not be given the details of your procedure findings, then the procedure report has been included in a sealed envelope for you to review at your convenience later.  YOU SHOULD EXPECT: Some feelings of bloating in the abdomen. Passage of more gas than usual.  Walking can help get rid of the air that was put into your GI tract during the procedure and reduce the bloating. If you had a lower endoscopy (such as a colonoscopy or flexible sigmoidoscopy) you may notice spotting of blood in your stool or on the toilet paper. If you underwent a bowel prep for your procedure, then you may not have a normal bowel movement for a few days.  DIET: At 112:15 patient may have water, and if she tolerates that, she can have a soft diet for the rest of the day.  ACTIVITY: Your care partner should take you home directly after the procedure.  You should plan to take it easy, moving slowly for the rest of the day.  You can resume normal activity the day after the procedure however you should NOT DRIVE or use heavy machinery for 24 hours (because of the sedation medicines used during the test).    SYMPTOMS TO REPORT IMMEDIATELY: A gastroenterologist can be reached at any hour.  During normal business hours, 8:30 AM to 5:00 PM Monday through Friday, call [phone].  After hours and on weekends, please call the GI answering service at [phone] who will take a message and have the physician on call contact you.   Following upper endoscopy (EGD)  Vomiting of blood or coffee ground material  New chest pain or pain under the shoulder blades  Painful or persistently difficult  swallowing  New shortness of breath  Fever of 100F or higher  Black, tarry-looking stools  FOLLOW UP: If any biopsies were taken you will be contacted by phone or by letter within the next 1-3 weeks.  Call your gastroenterologist if you have not heard about the biopsies in 3 weeks.  Our staff will call the home number listed on your records the next business day following your procedure to check on you and address any questions or concerns that you may have at that time regarding the information given to you following your procedure. This is a courtesy call and so if there is no answer at the home number and we have not heard from you through the emergency physician on call, we will assume that you have returned to your regular daily activities without incident.  Call the office for an appointment for OV in 3-4 weeks.  SIGNATURES/CONFIDENTIALITY: You and/or your care partner have signed paperwork which will be entered into your electronic medical record.  These signatures attest to the fact that that the information above on your After Visit Summary has been reviewed and is understood.  Full responsibility of the confidentiality of this discharge information lies with you and/or your care-partner.

## 2013-04-08 NOTE — Progress Notes (Signed)
Pt stable to RR 

## 2013-04-08 NOTE — Op Note (Signed)
[place]  [address] [place], [id number]   ENDOSCOPY PROCEDURE REPORT  PATIENT: Joann Gutierrez, Joann Gutierrez  MR#: [id number] BIRTHDATE: July 31, 1945 , 62  yrs. old GENDER: Female ENDOSCOPIST: Inda Castle, MD REFERRED BY:  Seward Carol, M.D. PROCEDURE DATE:  04/08/2013 PROCEDURE:  EGD, diagnostic and Maloney dilation of esophagus ASA CLASS:     Class III INDICATIONS:  Dysphagia. MEDICATIONS: MAC sedation, administered by CRNA, Propofol (Diprivan) 120 mg IV, and Simethicone 0.6cc PO TOPICAL ANESTHETIC:  DESCRIPTION OF PROCEDURE: After the risks benefits and alternatives of the procedure were thoroughly explained, informed consent was obtained.  The LB [id number] endoscope was introduced through the mouth and advanced to the third portion of the duodenum. Without limitations.  The instrument was slowly withdrawn as the mucosa was fully examined.      The upper, middle and distal third of the esophagus were carefully inspected and no abnormalities were noted.  The z-line was well seen at the GEJ.  The endoscope was pushed into the fundus which was normal including a retroflexed view.  The antrum, gastric body, first and second part of the duodenum were unremarkable. Retroflexed views revealed no abnormalities.     The scope was then withdrawn from the patient and the procedure completed.  Because of complaints of dysphagia a #52 [place] Maloney dilator was passed. There was moderate resistance and no heme.  COMPLICATIONS: There were no complications. ENDOSCOPIC IMPRESSION: dysphagia without obvious stricture-status post Maloney dilation (with resistance)  RECOMMENDATIONS: OV 3-4 weeks barium swallow if dysphagia persists REPEAT EXAM:  eSigned:  Inda Castle, MD 04/08/2013 11:10 AM   CC:

## 2013-04-08 NOTE — Progress Notes (Signed)
Called to room to assist during endoscopic procedure.  Patient ID and intended procedure confirmed with present staff. Received instructions for my participation in the procedure from the performing physician.  

## 2013-04-08 NOTE — Progress Notes (Addendum)
Patient stating she had to take her NTG spray and two baby aspirins last night after walking across the street to church. Patient stating she was short of breath and had chest pain. Denies chest pain today, a little short of breath on walking to admitting area but no chest pain. Patient sleeps with CPAP and 02 at 2l/min. Does use the 02 as needed for shortness of breath. Informed Dr. Deatra Ina. Dr. Deatra Ina in to see the patient and examined patient. Patient stating her breathing is baseline and no chest pain at rest only on excertion. Dr. Deatra Ina to notify CRNA. Patient expressing that she really wants procedure today related to problems with swallowing.

## 2013-04-09 ENCOUNTER — Telehealth: Payer: Self-pay

## 2013-04-09 NOTE — Telephone Encounter (Signed)
  Follow up Call-  Call back number 04/08/2013 03/17/2012 11/27/2010 07/23/2010  Post procedure Call Back phone  # [phone] hm [phone] ok to leave a message [phone] home  Permission to leave phone message Yes Yes - -     Patient questions:  Do you have a fever, pain , or abdominal swelling? no Pain Score  0 *  Have you tolerated food without any problems? yes  Have you been able to return to your normal activities? yes  Do you have any questions about your discharge instructions: Diet   no Medications  no Follow up visit  no  Do you have questions or concerns about your Care? no  Actions: * If pain score is 4 or above: No action needed, pain <4.

## 2013-04-12 ENCOUNTER — Encounter (HOSPITAL_COMMUNITY): Payer: Self-pay

## 2013-04-12 ENCOUNTER — Encounter (HOSPITAL_COMMUNITY)
Admission: RE | Admit: 2013-04-12 | Discharge: 2013-04-12 | Disposition: A | Discharge status: Home / Self Care | Payer: Medicare Other | Source: Ambulatory Visit | Attending: Pulmonary Disease | Admitting: Pulmonary Disease

## 2013-04-12 NOTE — Progress Notes (Signed)
Joann Gutierrez completed a Six-Minute Walk Test on 04/12/13 . Joann Gutierrez walked 289 feet with 4 breaks. The breaks were equivalent to 1 minute and 35 seconds.  The patient's lowest oxygen saturation was 95% , highest heart rate was 121bpm , and highest blood pressure was 122/54. The patient was on 0 liters. Joann Gutierrez stated that shortness of breath and fatigue hindered their walk test.

## 2013-04-13 ENCOUNTER — Encounter (HOSPITAL_COMMUNITY): Payer: Self-pay

## 2013-04-13 ENCOUNTER — Encounter (HOSPITAL_COMMUNITY)
Admission: RE | Admit: 2013-04-13 | Discharge: 2013-04-13 | Disposition: A | Discharge status: Home / Self Care | Payer: Medicare Other | Source: Ambulatory Visit | Attending: Pulmonary Disease | Admitting: Pulmonary Disease

## 2013-04-13 NOTE — Progress Notes (Signed)
Today, Joann Gutierrez exercised at [facility]. Service time was from 10:30 to 12:00.  The patient exercised for more than 31 minutes on different pieces of equipment. Oxygen saturation, heart rate, blood pressure, rate of perceived exertion, and shortness of breath were all monitored before, during, and after exercise. Joann Gutierrez presented with no problems at today's exercise session.   Pre-exercise vitals:   Weight: 126.2   Liters of O2: 0   SpO2: 96   HR: 99   BP: 124/72   CGB: 125  Exercise vitals:   Highest heartrate:  106   Lowest oxygen saturation: 95   Highest blood pressure: 128/52   Liters of O2: 0  Post-exercise vitals:   SpO2: 96   HR: 86   BP: 132/60   CGB: 130

## 2013-04-14 LAB — GLUCOSE, CAPILLARY
GLUCOSE-CAPILLARY: 126 mg/dL — AB (ref 70–99)
GLUCOSE-CAPILLARY: 130 mg/dL — AB (ref 70–99)

## 2013-04-15 ENCOUNTER — Encounter (HOSPITAL_COMMUNITY)
Admission: RE | Admit: 2013-04-15 | Discharge: 2013-04-15 | Disposition: A | Discharge status: Home / Self Care | Payer: Medicare Other | Source: Ambulatory Visit | Attending: Pulmonary Disease | Admitting: Pulmonary Disease

## 2013-04-15 LAB — GLUCOSE, CAPILLARY: Glucose-Capillary: 78 mg/dL (ref 70–99)

## 2013-04-15 NOTE — Progress Notes (Signed)
Patient's CBG was 81 at the beginning of class, given lemonade and snack of peanut butter and graham crackers.  15 minutes later CBG 101, she did not exercise , discussed  on eating more for breakfast.  Patient did not exercise.

## 2013-04-20 ENCOUNTER — Encounter (HOSPITAL_COMMUNITY): Payer: Self-pay

## 2013-04-20 ENCOUNTER — Encounter (HOSPITAL_COMMUNITY)
Admission: RE | Admit: 2013-04-20 | Discharge: 2013-04-20 | Disposition: A | Discharge status: Home / Self Care | Payer: Medicare Other | Source: Ambulatory Visit | Attending: Pulmonary Disease | Admitting: Pulmonary Disease

## 2013-04-20 NOTE — Progress Notes (Signed)
Today, [name] exercised at [facility]. Service time was from 10:30 to 12:30.  The patient exercised for more than 31 minutes performing aerobic, strengthening, and stretching exercises. Oxygen saturation, heart rate, blood pressure, rate of perceived exertion, and shortness of breath were all monitored before, during, and after exercise. Joann Gutierrez presented with no problems at today's exercise session.   Pre-exercise vitals:   Weight kg: 124.3   Liters of O2: 0   SpO2: 96   HR: 76   BP: 110/62   CBG: 149  Exercise vitals:   Highest heartrate:  109   Lowest oxygen saturation: 96   Highest blood pressure: 96/50   Liters of 02: 0  Post-exercise vitals:   SpO2: 94   HR: 99   BP: 124/56   Liters of O2: 0   CBG: 233  Dr. Brand Males, Medical Director Dr. Ree Kida is immediately available during today's Pulmonary Rehab session for [name] on 04/20/13 at 10:30am class time.

## 2013-04-22 ENCOUNTER — Encounter (HOSPITAL_COMMUNITY)
Admission: RE | Admit: 2013-04-22 | Discharge: 2013-04-22 | Disposition: A | Discharge status: Home / Self Care | Payer: Medicare Other | Source: Ambulatory Visit | Attending: Pulmonary Disease | Admitting: Pulmonary Disease

## 2013-04-22 DIAGNOSIS — G4733 Obstructive sleep apnea (adult) (pediatric): Secondary | ICD-10-CM | POA: Insufficient documentation

## 2013-04-22 DIAGNOSIS — E7800 Pure hypercholesterolemia, unspecified: Secondary | ICD-10-CM | POA: Diagnosis not present

## 2013-04-22 DIAGNOSIS — Z5189 Encounter for other specified aftercare: Secondary | ICD-10-CM | POA: Insufficient documentation

## 2013-04-22 DIAGNOSIS — E1029 Type 1 diabetes mellitus with other diabetic kidney complication: Secondary | ICD-10-CM | POA: Insufficient documentation

## 2013-04-22 DIAGNOSIS — N058 Unspecified nephritic syndrome with other morphologic changes: Secondary | ICD-10-CM | POA: Insufficient documentation

## 2013-04-22 DIAGNOSIS — N183 Chronic kidney disease, stage 3 unspecified: Secondary | ICD-10-CM | POA: Insufficient documentation

## 2013-04-22 DIAGNOSIS — J9610 Chronic respiratory failure, unspecified whether with hypoxia or hypercapnia: Secondary | ICD-10-CM | POA: Diagnosis not present

## 2013-04-22 DIAGNOSIS — M79609 Pain in unspecified limb: Secondary | ICD-10-CM | POA: Diagnosis not present

## 2013-04-22 DIAGNOSIS — I129 Hypertensive chronic kidney disease with stage 1 through stage 4 chronic kidney disease, or unspecified chronic kidney disease: Secondary | ICD-10-CM | POA: Insufficient documentation

## 2013-04-22 DIAGNOSIS — E669 Obesity, unspecified: Secondary | ICD-10-CM | POA: Diagnosis not present

## 2013-04-22 DIAGNOSIS — M25579 Pain in unspecified ankle and joints of unspecified foot: Secondary | ICD-10-CM | POA: Insufficient documentation

## 2013-04-27 ENCOUNTER — Encounter (HOSPITAL_COMMUNITY)
Admission: RE | Admit: 2013-04-27 | Discharge: 2013-04-27 | Disposition: A | Discharge status: Home / Self Care | Payer: Medicare Other | Source: Ambulatory Visit | Attending: Pulmonary Disease | Admitting: Pulmonary Disease

## 2013-04-27 ENCOUNTER — Encounter (HOSPITAL_COMMUNITY): Payer: Self-pay

## 2013-04-27 NOTE — Progress Notes (Signed)
Today, [name] exercised at [facility]. Service time was from 10:30 to 12:30.  The patient exercised for more than 31 minutes performing aerobic, strengthening, and stretching exercises. Oxygen saturation, heart rate, blood pressure, rate of perceived exertion, and shortness of breath were all monitored before, during, and after exercise. Joann Gutierrez presented with no problems at today's exercise session.   There was an increase workload change during today's exercise session.  Pre-exercise vitals:   Weight kg: 123.0   Liters of O2: 0   SpO2: 97   HR: 101   BP: 118/52   CBG: 171  Exercise vitals:   Highest heartrate:  111   Lowest oxygen saturation: 96   Highest blood pressure: 122/58   Liters of 02: 0  Post-exercise vitals:   SpO2: 96   HR: 101   BP: 122/60   Liters of O2: 0   CBG: 170  Dr. Brand Males, Medical Director Dr. Aileen Fass is immediately available during today's Pulmonary Rehab session for [place] on 04/27/13 at 10:30 class time.

## 2013-04-29 ENCOUNTER — Encounter (HOSPITAL_COMMUNITY)
Admission: RE | Admit: 2013-04-29 | Discharge: 2013-04-29 | Disposition: A | Discharge status: Home / Self Care | Payer: Medicare Other | Source: Ambulatory Visit | Attending: Pulmonary Disease | Admitting: Pulmonary Disease

## 2013-04-29 ENCOUNTER — Encounter (HOSPITAL_COMMUNITY): Payer: Self-pay

## 2013-04-29 NOTE — Progress Notes (Signed)
Today, Joann Gutierrez exercised at [facility]. Service time was from 10:30 to 12:30.  The patient exercised for more than 31 minutes performing aerobic, strengthening, and stretching exercises. Oxygen saturation, heart rate, blood pressure, rate of perceived exertion, and shortness of breath were all monitored before, during, and after exercise. Weslynn presented with no problems at today's exercise session. The patient attended the education class "Advanced Directives" with Jeanella Craze today.  There was no workload change during today's exercise session.  Pre-exercise vitals:   Weight kg: 123.6   Liters of O2: 0   SpO2: 93   HR: 99   BP: 110/54   CBG: 234  Exercise vitals:   Highest heartrate:  105   Lowest oxygen saturation: 95   Highest blood pressure: 110/60   Liters of 02: 0  Post-exercise vitals:   SpO2: 96   HR: 88   BP: 118/60   Liters of O2: 0   CBG: 165  Dr. Brand Males, Medical Director Dr. Dyann Kief is immediately available during today's Pulmonary Rehab session for Joann Gutierrez on 04/29/13 at 10:30 class time.

## 2013-05-04 ENCOUNTER — Encounter (HOSPITAL_COMMUNITY): Payer: Self-pay

## 2013-05-04 ENCOUNTER — Encounter (HOSPITAL_COMMUNITY)
Admission: RE | Admit: 2013-05-04 | Discharge: 2013-05-04 | Disposition: A | Discharge status: Home / Self Care | Payer: Medicare Other | Source: Ambulatory Visit | Attending: Pulmonary Disease | Admitting: Pulmonary Disease

## 2013-05-04 NOTE — Progress Notes (Signed)
Today, Elana exercised at [facility]. Service time was from 10:30 to 12:30.  The patient exercised for more than 31 minutes performing aerobic, strengthening, and stretching exercises. Oxygen saturation, heart rate, blood pressure, rate of perceived exertion, and shortness of breath were all monitored before, during, and after exercise. Charsie presented with no problems at today's exercise session.   There was an increase workload change during today's exercise session.  Pre-exercise vitals:   Weight kg: 122.4   Liters of O2: ra   SpO2: 95   HR: 103   BP: 136/70   CBG: 289  Exercise vitals:   Highest heartrate:  103   Lowest oxygen saturation: 96   Highest blood pressure: 128/70   Liters of 02: ra  Post-exercise vitals:   SpO2: 96   HR: 96   BP: 118/60   Liters of O2: ra   CBG: 262  Dr. Brand Males, Medical Director Dr. Dyann Kief is immediately available during today's Pulmonary Rehab session for [place] on 05/04/13 at 10:30 class time.

## 2013-05-05 ENCOUNTER — Ambulatory Visit (INDEPENDENT_AMBULATORY_CARE_PROVIDER_SITE_OTHER): Payer: Medicare Other | Admitting: Gastroenterology

## 2013-05-05 ENCOUNTER — Encounter: Payer: Self-pay | Admitting: Gastroenterology

## 2013-05-05 VITALS — BP 124/74 | HR 88 | Ht 64.0 in | Wt 268.0 lb

## 2013-05-05 DIAGNOSIS — R05 Cough: Secondary | ICD-10-CM | POA: Diagnosis not present

## 2013-05-05 DIAGNOSIS — K222 Esophageal obstruction: Secondary | ICD-10-CM

## 2013-05-05 DIAGNOSIS — R1310 Dysphagia, unspecified: Secondary | ICD-10-CM | POA: Diagnosis not present

## 2013-05-05 DIAGNOSIS — R059 Cough, unspecified: Secondary | ICD-10-CM | POA: Insufficient documentation

## 2013-05-05 NOTE — Progress Notes (Signed)
          History of Present Illness:  The patient has returned following dilation of the esophagus.  No frank stricture was seen.  She reports improvement in dysphagia.  Her main complaint is throat congestion.    Review of Systems: Pertinent positive and negative review of systems were noted in the above HPI section. All other review of systems were otherwise negative.    Current Medications, Allergies, Past Medical History, Past Surgical History, Family History and Social History were reviewed in [place] record  Vital signs were reviewed in today's medical record. Physical Exam: General: Well developed , well nourished, no acute distress   See Assessment and Plan under Problem List

## 2013-05-05 NOTE — Assessment & Plan Note (Signed)
Improved following dilation therapy although no frank stricture was seen.

## 2013-05-05 NOTE — Assessment & Plan Note (Signed)
No stricture seen at most recent endoscopy but dysphagia is improved with dilation therapy.

## 2013-05-05 NOTE — Patient Instructions (Signed)
Take guaifenesin as needed for throat conjestion

## 2013-05-05 NOTE — Assessment & Plan Note (Signed)
Patient actually complains of difficulty clearing her throat rather than coughing, per se.  Plan trial of guaifenesin

## 2013-05-06 ENCOUNTER — Encounter (HOSPITAL_COMMUNITY): Payer: Self-pay

## 2013-05-06 ENCOUNTER — Encounter (HOSPITAL_COMMUNITY)
Admission: RE | Admit: 2013-05-06 | Discharge: 2013-05-06 | Disposition: A | Discharge status: Home / Self Care | Payer: Medicare Other | Source: Ambulatory Visit | Attending: Pulmonary Disease | Admitting: Pulmonary Disease

## 2013-05-06 NOTE — Progress Notes (Signed)
Today, Joann Gutierrez exercised at [facility]. Service time was from 10:30 to 12:30.  The patient exercised for more than 31 minutes performing aerobic, strengthening, and stretching exercises. Oxygen saturation, heart rate, blood pressure, rate of perceived exertion, and shortness of breath were all monitored before, during, and after exercise. Joann Gutierrez presented with no problems at today's exercise session.   There was no workload change during today's exercise session.  Pre-exercise vitals:   Weight kg: 121.8   Liters of O2: ra   SpO2: 97   HR: 98   BP: 130/50   CBG: 156  Exercise vitals:   Highest heartrate:  118   Lowest oxygen saturation: 95   Highest blood pressure: 118/56   Liters of 02: ra  Post-exercise vitals:   SpO2: 97   HR: 95   BP: 112/60   Liters of O2: ra   CBG: 251  Dr. Brand Males, Medical Director Dr. Aileen Fass is immediately available during today's Pulmonary Rehab session for [name] on 05/06/13 at 10:30 class time.

## 2013-05-06 NOTE — Progress Notes (Signed)
Joann Gutierrez 68 y.o. female Nutrition Note Spoke with pt. Pt is obese. Pt wants to lose wt. Pt has been trying to lose wt by decreasing portion sizes consumed. Pt making healthy food choices the majority of the time. Pt's Rate Your Plate results reviewed with pt. Pt expressed understanding.  Pt avoids many salty foods; does not use canned/ convenience food often. Pt does use frozen vegetables frequently.  Pt does not add salt to food.  The role of sodium in lung disease reviewed with pt.  Pt is diabetic.  Pt checks CBG's 4 times a day. Fasting CBG's reportedly  118- 165mg /dL before meals. Pt concerned re: higher than usual fasting CBG's. Pt's last A1c indicates blood glucose well-controlled. Pt reports she has an appointment with her PCP next month and will discuss elevated CBG's at that time. ? If pt may benefit from an increase in Lantus. Pt expressed understanding of the information reviewed.   Nutrition Diagnosis   Food-and nutrition-related knowledge deficit related to lack of exposure to information as related to diagnosis of pulmonary disease   Obesity related to excessive energy intake as evidenced by a BMI of 46 Nutrition Rx/Est. Daily Nutrition Needs for: ? wt loss 1700-2000 Kcal  100-120 gm protein   1500 mg or less sodium     250 gm CHO Nutrition Intervention   Pt's individual nutrition plan and goals reviewed with pt.   Benefits of adopting healthy eating habits discussed when pt's Rate Your Plate reviewed.   Pt to attend the Nutrition and Lung Disease class   Continual client-centered nutrition education by RD, as part of interdisciplinary care. Goal(s) 1. Identify food quantities necessary to achieve wt loss of  -2# per week to a goal wt of 111.3-119.5 kg (244-262 lb) at graduation from pulmonary rehab. 2. CBG's in the normal range or as close to normal as is safely possible. Monitor and Evaluate progress toward nutrition goal with team.   Derek Mound, M.Ed, RD, LDN,  CDE 05/06/2013 1:39 PM

## 2013-05-11 ENCOUNTER — Encounter (HOSPITAL_COMMUNITY)
Admission: RE | Admit: 2013-05-11 | Discharge: 2013-05-11 | Disposition: A | Discharge status: Home / Self Care | Payer: Medicare Other | Source: Ambulatory Visit | Attending: Pulmonary Disease | Admitting: Pulmonary Disease

## 2013-05-11 NOTE — Progress Notes (Signed)
Today, Joann Gutierrez exercised at [facility]. Service time was from 10:30 to 12:30.  The patient exercised for more than 31 minutes performing aerobic, strengthening, and stretching exercises. Oxygen saturation, heart rate, blood pressure, rate of perceived exertion, and shortness of breath were all monitored before, during, and after exercise. [name] presented with no problems at today's exercise session.   There was decrease in workload during today's exercise session due to fatigue.  Pre-exercise vitals:   Weight kg: 123.7   Liters of O2: 0   SpO2: 95   HR: 99   BP: 142/88   CBG: 228  Exercise vitals:   Highest heartrate:  110   Lowest oxygen saturation: 95   Highest blood pressure: 122/70   Liters of 02: ra  Post-exercise vitals:   SpO2: 95   HR: 100   BP: 118/76   Liters of O2: 0   CBG: 248  Dr. Brand Males, Medical Director Dr. Aileen Fass is immediately available during today's Pulmonary Rehab session for Joann Gutierrez on 05/11/13 at 10:30 class time.

## 2013-05-13 ENCOUNTER — Encounter (HOSPITAL_COMMUNITY)
Admission: RE | Admit: 2013-05-13 | Discharge: 2013-05-13 | Disposition: A | Discharge status: Home / Self Care | Payer: Medicare Other | Source: Ambulatory Visit | Attending: Pulmonary Disease | Admitting: Pulmonary Disease

## 2013-05-13 NOTE — Progress Notes (Signed)
Today, Joann Gutierrez exercised at [facility]. Service time was from 10:30 to 12:30.  The patient exercised for more than 31 minutes performing aerobic, strengthening, and stretching exercises. Oxygen saturation, heart rate, blood pressure, rate of perceived exertion, and shortness of breath were all monitored before, during, and after exercise. Joann Gutierrez presented with no problems at today's exercise session.   There was an increase workload change during today's exercise session.  Pre-exercise vitals:   Weight kg: 124.0   Liters of O2: ra   SpO2: 96   HR: 107   BP: 138/60   CBG: 251  Exercise vitals:   Highest heartrate:  109   Lowest oxygen saturation: 97   Highest blood pressure: 122/60   Liters of 02: ra  Post-exercise vitals:   SpO2: 97   HR: 99   BP: 114/72   Liters of O2: ra   CBG: 201  Dr. Brand Males, Medical Director Dr. Sheran Fava is immediately available during today's Pulmonary Rehab session for [place] on 05/13/13 at 10:30 class time.

## 2013-05-18 ENCOUNTER — Encounter (HOSPITAL_COMMUNITY)
Admission: RE | Admit: 2013-05-18 | Discharge: 2013-05-18 | Disposition: A | Discharge status: Home / Self Care | Payer: Medicare Other | Source: Ambulatory Visit | Attending: Pulmonary Disease | Admitting: Pulmonary Disease

## 2013-05-18 NOTE — Progress Notes (Signed)
I have reviewed a Home Exercise Prescription with Joann Gutierrez A [place] . Jai is currently exercising with her resistance bands 3 times a week at home.  The patient was advised to take 2-10 minute walks 2 days a week.  Joann Gutierrez and I discussed how to progress their exercise prescription.  The patient stated that their goals were to be able to go out of the house more without feeling so short of breath.  The patient stated that they understand the exercise prescription.  We reviewed exercise guidelines, target heart rate during exercise, oxygen use, weather, home pulse oximeter, endpoints for exercise, and goals.  Patient is encouraged to come to me with any questions. I will continue to follow up with the patient to assist them with progression and safety.

## 2013-05-18 NOTE — Progress Notes (Signed)
Today, Joann Gutierrez exercised at [facility]. Service time was from 1030 to 1215.  The patient exercised for more than 31 minutes performing aerobic, strengthening, and stretching exercises. Oxygen saturation, heart rate, blood pressure, rate of perceived exertion, and shortness of breath were all monitored before, during, and after exercise. Joann Gutierrez presented with one problem at today's exercise session. During the third exercise station she c/o having a floating feeling that she associates with a high CBG.  CBG at that time 327, unable to get a urine sample to check for ketones.  Patient had her insulin with her and she gave herself 8 units of Humulin insulin, which is her sliding scale.  She was rolled out in a wheelchair with her husband.  I reported all this information to patient's husband, she will recheck her CBG once she is home.  She states she feels better before leaving.    There was no workload change during today's exercise session.  Pre-exercise vitals:   Weight kg: 121.9   Liters of O2: RA   SpO2: 94   HR: 110   BP: 110/60   CBG: 201  Exercise vitals:   Highest heartrate:  112   Lowest oxygen saturation: 95   Highest blood pressure: 122/60   Liters of 02: RA  Post-exercise vitals:   SpO2: 95   HR: 97   BP: 106/60   Liters of O2: RA   CBG: 327  Patient took 8 units of Humulin insulin, which is her sliding scale orders, unable to check ketones, unable to get urine sample. Dr. Brand Males, Medical Director Dr. Sheran Fava is immediately available during today's Pulmonary Rehab session for [facility] on 05/18/2013 at 1030 class time.

## 2013-05-20 ENCOUNTER — Encounter (HOSPITAL_COMMUNITY)
Admission: RE | Admit: 2013-05-20 | Discharge: 2013-05-20 | Disposition: A | Discharge status: Home / Self Care | Payer: Medicare Other | Source: Ambulatory Visit | Attending: Pulmonary Disease | Admitting: Pulmonary Disease

## 2013-05-20 NOTE — Progress Notes (Signed)
Today, Joann Gutierrez exercised at [facility]. Service time was from 10:30 to 12:15.  The patient exercised for more than 31 minutes performing aerobic, strengthening, and stretching exercises. Oxygen saturation, heart rate, blood pressure, rate of perceived exertion, and shortness of breath were all monitored before, during, and after exercise. Joann Gutierrez presented with no problems at today's exercise session. The patient attended the education class "Exercise for the Pulmonary Patient" today.  There was no workload change during today's exercise session.  Pre-exercise vitals:   Weight kg: 122.0   Liters of O2: ra   SpO2: 96   HR: 96   BP: 142/52   CBG: 210  Exercise vitals:   Highest heartrate:  104   Lowest oxygen saturation: 96   Highest blood pressure: 110/60   Liters of 02: ra  Post-exercise vitals:   SpO2: 96   HR: 95   BP: 124/60   Liters of O2: ra   CBG: 170  Dr. Brand Males, Medical Director Dr. Aileen Fass is immediately available during today's Pulmonary Rehab session for [place] on 05/20/13 at 10:30am class time.

## 2013-05-25 ENCOUNTER — Encounter (HOSPITAL_COMMUNITY)
Admission: RE | Admit: 2013-05-25 | Discharge: 2013-05-25 | Disposition: A | Discharge status: Home / Self Care | Payer: Medicare Other | Source: Ambulatory Visit | Attending: Pulmonary Disease | Admitting: Pulmonary Disease

## 2013-05-25 DIAGNOSIS — N183 Chronic kidney disease, stage 3 unspecified: Secondary | ICD-10-CM | POA: Insufficient documentation

## 2013-05-25 DIAGNOSIS — E7800 Pure hypercholesterolemia, unspecified: Secondary | ICD-10-CM | POA: Diagnosis not present

## 2013-05-25 DIAGNOSIS — I129 Hypertensive chronic kidney disease with stage 1 through stage 4 chronic kidney disease, or unspecified chronic kidney disease: Secondary | ICD-10-CM | POA: Insufficient documentation

## 2013-05-25 DIAGNOSIS — J9610 Chronic respiratory failure, unspecified whether with hypoxia or hypercapnia: Secondary | ICD-10-CM | POA: Diagnosis not present

## 2013-05-25 DIAGNOSIS — Z5189 Encounter for other specified aftercare: Secondary | ICD-10-CM | POA: Diagnosis not present

## 2013-05-25 DIAGNOSIS — E669 Obesity, unspecified: Secondary | ICD-10-CM | POA: Diagnosis not present

## 2013-05-25 DIAGNOSIS — M25579 Pain in unspecified ankle and joints of unspecified foot: Secondary | ICD-10-CM | POA: Insufficient documentation

## 2013-05-25 DIAGNOSIS — G4733 Obstructive sleep apnea (adult) (pediatric): Secondary | ICD-10-CM | POA: Diagnosis not present

## 2013-05-25 DIAGNOSIS — E1029 Type 1 diabetes mellitus with other diabetic kidney complication: Secondary | ICD-10-CM | POA: Insufficient documentation

## 2013-05-25 DIAGNOSIS — M79609 Pain in unspecified limb: Secondary | ICD-10-CM | POA: Insufficient documentation

## 2013-05-25 DIAGNOSIS — N058 Unspecified nephritic syndrome with other morphologic changes: Secondary | ICD-10-CM | POA: Insufficient documentation

## 2013-05-25 NOTE — Progress Notes (Signed)
Today, Joann Gutierrez exercised at [facility]. Service time was from 10:30 to 12:00.  The patient exercised for more than 31 minutes performing aerobic, strengthening, and stretching exercises. Oxygen saturation, heart rate, blood pressure, rate of perceived exertion, and shortness of breath were all monitored before, during, and after exercise. Joann Gutierrez presented with no problems at today's exercise session.   There was no workload change during today's exercise session.  Pre-exercise vitals:   Weight kg: 124.5   Liters of O2: ra   SpO2: 97   HR: 102   BP: 138/52   CBG: 170  Exercise vitals:   Highest heartrate:  101   Lowest oxygen saturation: 97   Highest blood pressure: 126/54   Liters of 02: ra  Post-exercise vitals:   SpO2: 96   HR: 86   BP: 116/64   Liters of O2: ra   CBG: 143  Dr. Brand Males, Medical Director Dr. Aileen Fass is immediately available during today's Pulmonary Rehab session for Joann Gutierrez on 05/25/13 at 10:30am class time.

## 2013-05-27 ENCOUNTER — Encounter (HOSPITAL_COMMUNITY): Admission: RE | Admit: 2013-05-27 | Payer: Medicare Other | Source: Ambulatory Visit

## 2013-05-28 DIAGNOSIS — D638 Anemia in other chronic diseases classified elsewhere: Secondary | ICD-10-CM | POA: Diagnosis not present

## 2013-05-28 DIAGNOSIS — N183 Chronic kidney disease, stage 3 unspecified: Secondary | ICD-10-CM | POA: Diagnosis not present

## 2013-05-28 DIAGNOSIS — I10 Essential (primary) hypertension: Secondary | ICD-10-CM | POA: Diagnosis not present

## 2013-05-28 DIAGNOSIS — M948X9 Other specified disorders of cartilage, unspecified sites: Secondary | ICD-10-CM | POA: Diagnosis not present

## 2013-06-01 ENCOUNTER — Encounter (HOSPITAL_COMMUNITY)
Admission: RE | Admit: 2013-06-01 | Discharge: 2013-06-01 | Disposition: A | Discharge status: Home / Self Care | Payer: Medicare Other | Source: Ambulatory Visit | Attending: Pulmonary Disease | Admitting: Pulmonary Disease

## 2013-06-01 NOTE — Progress Notes (Signed)
Today, Joann Gutierrez exercised at [facility]. Service time was from 10:30 to 12:00.  The patient exercised for more than 31 minutes performing aerobic, strengthening, and stretching exercises. Oxygen saturation, heart rate, blood pressure, rate of perceived exertion, and shortness of breath were all monitored before, during, and after exercise. [name] presented with no problems at today's exercise session.   There was an increase in workload change during today's exercise session.  Pre-exercise vitals:   Weight kg: 123.8   Liters of O2: ra   SpO2: 98   HR: 104   BP: 130/60   CBG: 126  Exercise vitals:   Highest heartrate:  112   Lowest oxygen saturation: 96   Highest blood pressure: 160/60   Liters of 02: ra  Post-exercise vitals:   SpO2: 98   HR: 94   BP: 122/56   Liters of O2: ra   CBG: 159  Dr. Brand Males, Medical Director Dr. Coralyn Pear is immediately available during today's Pulmonary Rehab session for Joann Gutierrez on 06/01/13 at 10:30am class time.

## 2013-06-02 ENCOUNTER — Ambulatory Visit (INDEPENDENT_AMBULATORY_CARE_PROVIDER_SITE_OTHER): Payer: Medicare Other | Admitting: Podiatry

## 2013-06-02 ENCOUNTER — Encounter: Payer: Self-pay | Admitting: Podiatry

## 2013-06-02 VITALS — BP 177/67 | HR 86 | Ht 64.0 in | Wt 270.0 lb

## 2013-06-02 DIAGNOSIS — M79673 Pain in unspecified foot: Secondary | ICD-10-CM

## 2013-06-02 DIAGNOSIS — M79609 Pain in unspecified limb: Secondary | ICD-10-CM

## 2013-06-02 DIAGNOSIS — B351 Tinea unguium: Secondary | ICD-10-CM | POA: Diagnosis not present

## 2013-06-02 DIAGNOSIS — M79606 Pain in leg, unspecified: Secondary | ICD-10-CM

## 2013-06-02 DIAGNOSIS — E119 Type 2 diabetes mellitus without complications: Secondary | ICD-10-CM | POA: Diagnosis not present

## 2013-06-02 NOTE — Patient Instructions (Signed)
Seen for hypertrophic nails. All nails debrided. Return in 3 months or as needed.  

## 2013-06-02 NOTE — Progress Notes (Signed)
Subjective:  68 y.o. year old diabetic female patient presents complaining of painful nails. Patient requests toe nails trimmed.  She is being treated for congestive heart failure. Stated that her blood sugar was 121 this morning.   Objective: Dermatologic:  Hypertrophic nails x 10.  No plantar lesions, no abnormal skin lesions noted.  Vascular:  All pedal pulses are palpable bilateral.  No pedal edema noted.  Orthopedic:  Mild hallux valgus with bunion on left foot.  Neurologic:  All epicritic and tactile sensations grossly intact.   Assessment:  Dystrophic mycotic nails x 10.  IDDM.  Painful feet.   Treatment: All mycotic nails debrided.  Return in 3 months or as needed.

## 2013-06-03 ENCOUNTER — Encounter (HOSPITAL_COMMUNITY)
Admission: RE | Admit: 2013-06-03 | Discharge: 2013-06-03 | Disposition: A | Discharge status: Home / Self Care | Payer: Medicare Other | Source: Ambulatory Visit | Attending: Pulmonary Disease | Admitting: Pulmonary Disease

## 2013-06-03 NOTE — Progress Notes (Signed)
Today, Dawnette exercised at [facility]. Service time was from 1030 to 1230.  The patient exercised for more than 31 minutes performing aerobic, strengthening, and stretching exercises. Oxygen saturation, heart rate, blood pressure, rate of perceived exertion, and shortness of breath were all monitored before, during, and after exercise. Joann Gutierrez presented with no problems at today's exercise session. Patient attended the nutrition for pulmonary patients lecture.  There was no workload change during today's exercise session.  Pre-exercise vitals:   Weight kg: 121.9   Liters of O2: RA   SpO2: 98   HR: 99   BP: 138/52   CBG: 124  Exercise vitals:   Highest heartrate:  101   Lowest oxygen saturation: 97   Highest blood pressure: 124/60   Liters of 02: RA  Post-exercise vitals:   SpO2: 94   HR: 97   BP: 126/74   Liters of O2: RA   CBG: 159 Dr. Brand Males, Medical Director Dr. Aileen Fass is immediately available during today's Pulmonary Rehab session for [place] on 06/03/2013 at 1030 class time.

## 2013-06-04 DIAGNOSIS — E7800 Pure hypercholesterolemia, unspecified: Secondary | ICD-10-CM | POA: Diagnosis not present

## 2013-06-04 DIAGNOSIS — J449 Chronic obstructive pulmonary disease, unspecified: Secondary | ICD-10-CM | POA: Diagnosis not present

## 2013-06-04 DIAGNOSIS — R413 Other amnesia: Secondary | ICD-10-CM | POA: Diagnosis not present

## 2013-06-04 DIAGNOSIS — E2839 Other primary ovarian failure: Secondary | ICD-10-CM | POA: Diagnosis not present

## 2013-06-04 DIAGNOSIS — N183 Chronic kidney disease, stage 3 unspecified: Secondary | ICD-10-CM | POA: Diagnosis not present

## 2013-06-04 DIAGNOSIS — I10 Essential (primary) hypertension: Secondary | ICD-10-CM | POA: Diagnosis not present

## 2013-06-04 DIAGNOSIS — E1129 Type 2 diabetes mellitus with other diabetic kidney complication: Secondary | ICD-10-CM | POA: Diagnosis not present

## 2013-06-07 DIAGNOSIS — M810 Age-related osteoporosis without current pathological fracture: Secondary | ICD-10-CM | POA: Diagnosis not present

## 2013-06-07 DIAGNOSIS — Z780 Asymptomatic menopausal state: Secondary | ICD-10-CM | POA: Diagnosis not present

## 2013-06-08 ENCOUNTER — Encounter (HOSPITAL_COMMUNITY)
Admission: RE | Admit: 2013-06-08 | Discharge: 2013-06-08 | Disposition: A | Discharge status: Home / Self Care | Payer: Medicare Other | Source: Ambulatory Visit | Attending: Pulmonary Disease | Admitting: Pulmonary Disease

## 2013-06-08 NOTE — Progress Notes (Signed)
Today, Joann Gutierrez exercised at [facility]. Service time was from 10:30am to 12:00pm.  The patient exercised for more than 31 minutes performing aerobic, strengthening, and stretching exercises. Oxygen saturation, heart rate, blood pressure, rate of perceived exertion, and shortness of breath were all monitored before, during, and after exercise. Joann Gutierrez presented with no problems at today's exercise session.   There was no workload change during today's exercise session.  Pre-exercise vitals:   Weight kg: 123.6   Liters of O2: ra   SpO2: 96   HR: 96   BP: 150/80   CBG: 159  Exercise vitals:   Highest heartrate:  109   Lowest oxygen saturation: 96   Highest blood pressure: 158/70   Liters of 02: ra  Post-exercise vitals:   SpO2: 97   HR: 85   BP: 116/58   Liters of O2: ra   CBG: na  Dr. Brand Males, Medical Director Dr. Aileen Fass is immediately available during today's Pulmonary Rehab session for [name] on 06/08/13 at 10:30am class time.

## 2013-06-10 ENCOUNTER — Encounter (HOSPITAL_COMMUNITY)
Admission: RE | Admit: 2013-06-10 | Discharge: 2013-06-10 | Disposition: A | Discharge status: Home / Self Care | Payer: Medicare Other | Source: Ambulatory Visit | Attending: Pulmonary Disease | Admitting: Pulmonary Disease

## 2013-06-10 NOTE — Progress Notes (Addendum)
Today, Joann Gutierrez exercised at [facility]. Service time was from 1030 to 1200.  The patient exercised for more than 31 minutes performing aerobic, strengthening, and stretching exercises. Oxygen saturation, heart rate, blood pressure, rate of perceived exertion, and shortness of breath were all monitored before, during, and after exercise. [name] presented with no problems at today's exercise session. Pt attended inhaler education lecture today.  There was no workload change during today's exercise session.  Pre-exercise vitals:   Weight kg: 122.7   Liters of O2: RA   SpO2: 95   HR: 94   BP: 110/50   CBG: NA  Exercise vitals:   Highest heartrate:  98   Lowest oxygen saturation: 97   Highest blood pressure: 110/50   Liters of 02: RA  Post-exercise vitals:   SpO2: 98   HR: 85   BP: 126/60   Liters of O2: RA   CBG: NA  Dr. Brand Males, Medical Director Dr. Dyann Kief is immediately available during today's Pulmonary Rehab session for Joann Gutierrez on 06/10/2013 at 1030 class time.

## 2013-06-15 ENCOUNTER — Encounter (HOSPITAL_COMMUNITY)
Admission: RE | Admit: 2013-06-15 | Discharge: 2013-06-15 | Disposition: A | Discharge status: Home / Self Care | Payer: Medicare Other | Source: Ambulatory Visit | Attending: Pulmonary Disease | Admitting: Pulmonary Disease

## 2013-06-15 NOTE — Progress Notes (Signed)
Today, Joann Gutierrez exercised at [facility]. Service time was from 1030 to 1200.  The patient exercised for more than 31 minutes performing aerobic, strengthening, and stretching exercises. Oxygen saturation, heart rate, blood pressure, rate of perceived exertion, and shortness of breath were all monitored before, during, and after exercise. [name] presented with no problems at today's exercise session.   There was no workload change during today's exercise session.  Pre-exercise vitals:   Weight kg: 125.0   Liters of O2: RA   SpO2: 97   HR: 97   BP: 124/62   CBG: 94 up to 102 with lemonaid  Exercise vitals:   Highest heartrate:  102   Lowest oxygen saturation: 97   Highest blood pressure: 136/62   Liters of 02: RA  Post-exercise vitals:   SpO2: 98   HR: 84   BP: 122/74   Liters of O2: RA   CBG: 156  Dr. Brand Males, Medical Director Dr. Dyann Kief is immediately available during today's Pulmonary Rehab session for [place] on 06/15/2013 at 1030 class time.

## 2013-06-17 ENCOUNTER — Encounter (HOSPITAL_COMMUNITY)
Admission: RE | Admit: 2013-06-17 | Discharge: 2013-06-17 | Disposition: A | Discharge status: Home / Self Care | Payer: Medicare Other | Source: Ambulatory Visit | Attending: Pulmonary Disease | Admitting: Pulmonary Disease

## 2013-06-17 NOTE — Progress Notes (Signed)
Today, Joann Gutierrez exercised at [facility]. Service time was from 1030 to 1215.  The patient exercised for more than 31 minutes performing aerobic, strengthening, and stretching exercises. Oxygen saturation, heart rate, blood pressure, rate of perceived exertion, and shortness of breath were all monitored before, during, and after exercise. [name] presented with no problems at today's exercise session. Patient attended the class regarding signs and symptoms of infection.  There was no workload change during today's exercise session.  Pre-exercise vitals:   Weight kg: 125.1   Liters of O2: RA   SpO2: 96   HR: 85   BP: 144/54   CBG: 105  Exercise vitals:   Highest heartrate:  97   Lowest oxygen saturation: 98   Highest blood pressure: 140/68   Liters of 02: RA  Post-exercise vitals:   SpO2: 97   HR: 92   BP: 124/72   Liters of O2: RA   CBG: 131 Dr. Brand Males, Medical Director Dr. Aileen Fass is immediately available during today's Pulmonary Rehab session for Joann Gutierrez on 06/17/2013 at 1030 class time.

## 2013-06-22 ENCOUNTER — Encounter (HOSPITAL_COMMUNITY)
Admission: RE | Admit: 2013-06-22 | Discharge: 2013-06-22 | Disposition: A | Discharge status: Home / Self Care | Payer: Medicare Other | Source: Ambulatory Visit | Attending: Pulmonary Disease | Admitting: Pulmonary Disease

## 2013-06-22 DIAGNOSIS — N183 Chronic kidney disease, stage 3 unspecified: Secondary | ICD-10-CM | POA: Insufficient documentation

## 2013-06-22 DIAGNOSIS — M25579 Pain in unspecified ankle and joints of unspecified foot: Secondary | ICD-10-CM | POA: Diagnosis not present

## 2013-06-22 DIAGNOSIS — E669 Obesity, unspecified: Secondary | ICD-10-CM | POA: Diagnosis not present

## 2013-06-22 DIAGNOSIS — M79609 Pain in unspecified limb: Secondary | ICD-10-CM | POA: Insufficient documentation

## 2013-06-22 DIAGNOSIS — Z5189 Encounter for other specified aftercare: Secondary | ICD-10-CM | POA: Insufficient documentation

## 2013-06-22 DIAGNOSIS — J9610 Chronic respiratory failure, unspecified whether with hypoxia or hypercapnia: Secondary | ICD-10-CM | POA: Diagnosis not present

## 2013-06-22 DIAGNOSIS — N058 Unspecified nephritic syndrome with other morphologic changes: Secondary | ICD-10-CM | POA: Diagnosis not present

## 2013-06-22 DIAGNOSIS — I129 Hypertensive chronic kidney disease with stage 1 through stage 4 chronic kidney disease, or unspecified chronic kidney disease: Secondary | ICD-10-CM | POA: Diagnosis not present

## 2013-06-22 DIAGNOSIS — E1029 Type 1 diabetes mellitus with other diabetic kidney complication: Secondary | ICD-10-CM | POA: Diagnosis not present

## 2013-06-22 DIAGNOSIS — E7800 Pure hypercholesterolemia, unspecified: Secondary | ICD-10-CM | POA: Diagnosis not present

## 2013-06-22 DIAGNOSIS — G4733 Obstructive sleep apnea (adult) (pediatric): Secondary | ICD-10-CM | POA: Diagnosis not present

## 2013-06-22 NOTE — Progress Notes (Signed)
Today, Joann Gutierrez exercised at [facility]. Service time was from 1030 to 1215.  The patient exercised for more than 31 minutes performing aerobic, strengthening, and stretching exercises. Oxygen saturation, heart rate, blood pressure, rate of perceived exertion, and shortness of breath were all monitored before, during, and after exercise. Joann Gutierrez presented with no problems at today's exercise session.   There was no workload change during today's exercise session.  Pre-exercise vitals:   Weight kg: 123.5   Liters of O2: ra   SpO2: 97   HR: 92   BP: 132/60   CBG: 248  Exercise vitals:   Highest heartrate:  99   Lowest oxygen saturation: 96   Highest blood pressure: 126/58   Liters of 02: ra  Post-exercise vitals:   SpO2: 95   HR: 82   BP: 126/62   Liters of O2: ra   CBG: 230  Dr. Brand Males, Medical Director Dr. Aileen Fass is immediately available during today's Pulmonary Rehab session for [place] on 06/22/2013 at 1030 class time.

## 2013-06-24 ENCOUNTER — Encounter (HOSPITAL_COMMUNITY)
Admission: RE | Admit: 2013-06-24 | Discharge: 2013-06-24 | Disposition: A | Discharge status: Home / Self Care | Payer: Medicare Other | Source: Ambulatory Visit | Attending: Pulmonary Disease | Admitting: Pulmonary Disease

## 2013-06-24 NOTE — Progress Notes (Signed)
Today, Joann Gutierrez exercised at [facility]. Service time was from 10:30 to 12:30.  The patient exercised for more than 31 minutes performing aerobic, strengthening, and stretching exercises. Oxygen saturation, heart rate, blood pressure, rate of perceived exertion, and shortness of breath were all monitored before, during, and after exercise. [name] presented with no problems at today's exercise session. The patient attended Exercise for the Pulmonary Patient education class today.   There was an increase workload change during today's exercise session.  Pre-exercise vitals:   Weight kg: 123.9   Liters of O2: ra   SpO2: 94   HR: 72   BP: 136/74   CBG: 181  Exercise vitals:   Highest heartrate:  93   Lowest oxygen saturation: 92   Highest blood pressure: 160/60   Liters of 02: ra  Post-exercise vitals:   SpO2: 97   HR: 81   BP: 118/72   Liters of O2: ra   CBG: 178  Dr. Brand Males, Medical Director Dr. Dyann Kief is immediately available during today's Pulmonary Rehab session for [place] on 06/24/13 at 10:30am class time.

## 2013-06-29 ENCOUNTER — Encounter (HOSPITAL_COMMUNITY)
Admission: RE | Admit: 2013-06-29 | Discharge: 2013-06-29 | Disposition: A | Discharge status: Home / Self Care | Payer: Medicare Other | Source: Ambulatory Visit | Attending: Pulmonary Disease | Admitting: Pulmonary Disease

## 2013-06-29 NOTE — Progress Notes (Signed)
Today, Joann Gutierrez exercised at [facility]. Service time was from 1030 to 1200.  The patient exercised for more than 31 minutes performing aerobic, strengthening, and stretching exercises. Oxygen saturation, heart rate, blood pressure, rate of perceived exertion, and shortness of breath were all monitored before, during, and after exercise. Joann Gutierrez presented with no problems at today's exercise session.   There was no workload change during today's exercise session.  Pre-exercise vitals:   Weight kg: 123.2   Liters of O2: ra   SpO2: 95   HR: 91   BP: 146/70   CBG: 166  Exercise vitals:   Highest heartrate:  113   Lowest oxygen saturation: 91   Highest blood pressure: 142/60   Liters of 02: ra  Post-exercise vitals:   SpO2: 96   HR: 82   BP: 128/62   Liters of O2: ra   CBG: 171  Dr. Brand Males, Medical Director Dr. Dyann Kief is immediately available during today's Pulmonary Rehab session for [place] on 06/29/2013 at 1030 class time.

## 2013-07-01 ENCOUNTER — Encounter (HOSPITAL_COMMUNITY)
Admission: RE | Admit: 2013-07-01 | Discharge: 2013-07-01 | Disposition: A | Discharge status: Home / Self Care | Payer: Medicare Other | Source: Ambulatory Visit | Attending: Pulmonary Disease | Admitting: Pulmonary Disease

## 2013-07-01 NOTE — Progress Notes (Signed)
Today, Joann Gutierrez exercised at [facility]. Service time was from 10:30am to 12:30pm.  The patient exercised for more than 31 minutes performing aerobic, strengthening, and stretching exercises. Oxygen saturation, heart rate, blood pressure, rate of perceived exertion, and shortness of breath were all monitored before, during, and after exercise. Joann Gutierrez presented with no problems at today's exercise session. The patient attended "Advanced Directives" class with Jeanella Craze.  There was no workload change during today's exercise session.  Pre-exercise vitals:   Weight kg: 123.0   Liters of O2: ra   SpO2: 96   HR: 90   BP: 132/54   CBG: 174  Exercise vitals:   Highest heartrate:  112   Lowest oxygen saturation: 94   Highest blood pressure: 150/58   Liters of 02: ra  Post-exercise vitals:   SpO2: 97   HR: 86   BP: 124/72   Liters of O2: ra   CBG: 148  Dr. Brand Males, Medical Director Dr. Aileen Fass is immediately available during today's Pulmonary Rehab session for Joann Gutierrez on 07/01/13 at 10:30am class time.

## 2013-07-06 ENCOUNTER — Encounter (HOSPITAL_COMMUNITY)
Admission: RE | Admit: 2013-07-06 | Discharge: 2013-07-06 | Disposition: A | Discharge status: Home / Self Care | Payer: Medicare Other | Source: Ambulatory Visit | Attending: Pulmonary Disease | Admitting: Pulmonary Disease

## 2013-07-06 NOTE — Progress Notes (Signed)
Today, [name] exercised at [facility]. Service time was from 1030 to 1215.  The patient exercised for more than 31 minutes performing aerobic, strengthening, and stretching exercises. Oxygen saturation, heart rate, blood pressure, rate of perceived exertion, and shortness of breath were all monitored before, during, and after exercise. Joann Gutierrez presented with no problems at today's exercise session.   There was no workload change during today's exercise session.  Pre-exercise vitals:   Weight kg: 121.9   Liters of O2: RA   SpO2: 97   HR: 88   BP: 136/66   CBG: 108  Exercise vitals:   Highest heartrate:  117   Lowest oxygen saturation: 95   Highest blood pressure: 130/72   Liters of 02: RA  Post-exercise vitals:   SpO2: 95   HR: 95   BP: 116/68   Liters of O2: RA   CBG: 119 Dr. Brand Males, Medical Director Dr. Aileen Fass is immediately available during today's Pulmonary Rehab session for [place] on 07/06/2013 at 1030 class time.

## 2013-07-08 ENCOUNTER — Encounter (HOSPITAL_COMMUNITY)
Admission: RE | Admit: 2013-07-08 | Discharge: 2013-07-08 | Disposition: A | Discharge status: Home / Self Care | Payer: Medicare Other | Source: Ambulatory Visit | Attending: Pulmonary Disease | Admitting: Pulmonary Disease

## 2013-07-08 ENCOUNTER — Telehealth: Payer: Self-pay | Admitting: Pulmonary Disease

## 2013-07-08 MED ORDER — BUDESONIDE-FORMOTEROL FUMARATE 160-4.5 MCG/ACT IN AERO: 2.0000 | INHALATION_SPRAY | Freq: Two times a day (BID) | RESPIRATORY_TRACT | Status: DC

## 2013-07-08 NOTE — Progress Notes (Signed)
Today, Manha exercised at [facility]. Service time was from 10:30am to 12:15pm.  The patient exercised for more than 31 minutes performing aerobic, strengthening, and stretching exercises. Oxygen saturation, heart rate, blood pressure, rate of perceived exertion, and shortness of breath were all monitored before, during, and after exercise. Pavielle presented with no problems at today's exercise session. The patient attended Pulmonary Education Class today on the topic of Medications with [facility].    There was no workload change during today's exercise session.  Pre-exercise vitals:   Weight kg: 122.8   Liters of O2: ra   SpO2: 95   HR: 83   BP: 122/66   CBG: 126  Exercise vitals:   Highest heartrate:  94   Lowest oxygen saturation: 95   Highest blood pressure: 118/70   Liters of 02: ra  Post-exercise vitals:   SpO2: 95   HR: 77   BP: 116/58   Liters of O2: ra   CBG: 122  Dr. Brand Males, Medical Director Dr. Dyann Kief is immediately available during today's Pulmonary Rehab session for [place] on 07/08/13 at 10:30am class time.

## 2013-07-08 NOTE — Telephone Encounter (Signed)
Called and gave VO for refill. Nothing further needed

## 2013-07-09 ENCOUNTER — Telehealth: Payer: Self-pay | Admitting: *Deleted

## 2013-07-09 NOTE — Telephone Encounter (Signed)
[place] 1 YEAR

## 2013-07-13 ENCOUNTER — Telehealth: Payer: Self-pay | Admitting: Cardiology

## 2013-07-13 ENCOUNTER — Encounter (HOSPITAL_COMMUNITY)
Admission: RE | Admit: 2013-07-13 | Discharge: 2013-07-13 | Disposition: A | Discharge status: Home / Self Care | Payer: Medicare Other | Source: Ambulatory Visit | Attending: Pulmonary Disease | Admitting: Pulmonary Disease

## 2013-07-13 NOTE — Telephone Encounter (Signed)
New message    Patient gain 2 kilo  since Thursday . Patient admit eating ice & drinking a lot of fluid.    Patient added an extra dosage lasix  80 mg three time a day.

## 2013-07-13 NOTE — Telephone Encounter (Signed)
Pt increased lasix to 80mg  tid (from 80mg  bid) last week on her own because her weight increased from 121.9 KG (266lbs)  07/06/13 to 122.8KG (268.5 lbs)  07/08/13. Her weight today is 124.6KG (275lbs), pt has lower extremity edema. BP 120s-130s normal for her, O2 Sat good.

## 2013-07-13 NOTE — Progress Notes (Signed)
Today, Jullian exercised at [facility]. Service time was from 1030 to 1230.  The patient exercised for more than 31 minutes performing aerobic, strengthening, and stretching exercises. Oxygen saturation, heart rate, blood pressure, rate of perceived exertion, and shortness of breath were all monitored before, during, and after exercise. Militza presented with an approx 5lb weight gain at today's exercise session. Weight was confirmed with a re-weigh. Patient stated she has been eating lots of ice since last rehab visit and has been drinking lots of fluids r/t excessive outdoor temp. Patient also states she took it upon herself to increase her lasix from 80mg  bid to 80mg  tid with no increase in urination. It is noted that patient has a history of stage 3 chronic kidney disease. Giulianna states she has been a little more SOB with exertion over the last several days and has noticed a significant amt of swelling in her lower ext. Upon examination, no lower ext pitting edema noted, however generalized edema is difficult to assess as patient is morbidly obese. Breath sounds are clear, diminished in the bases, and heart tones are normal. O2 saturations, HR, and BP are noted below. RN spoke with Lelon Frohlich at [facility], Dr. Thompson Caul nurse, and stated that Dr. Tamala Julian feels this is a primary care issue. Message left with Dr. Lina Sar nurse for her to follow up with Enid Derry at home and advise her on diuretics and weight gain. Patient was discharged from today's pulmonary exercise session in no acute distress.  There was no workload change during today's exercise session.  Pre-exercise vitals:   Weight kg: 124.6   Liters of O2: ra   SpO2: 96   HR: 106   BP: 126/52   CBG: 113  Exercise vitals:   Highest heartrate:  110   Lowest oxygen saturation: 95   Highest blood pressure: 140/66   Liters of 02: ra  Post-exercise vitals:   SpO2: 95   HR: 95   BP: 108/58   Liters of O2: ra   CBG: 122  Dr. Brand Males, Medical Director Dr. Dyann Kief is immediately available during today's Pulmonary Rehab session for [place] on 07/13/2013 at 1030 class time.

## 2013-07-13 NOTE — Telephone Encounter (Signed)
Reviewed with Dr Smith-Dr Tamala Julian reviewed cardiology records-he did not feel current symptoms were cardiology related. His recommendation was to contact Dr Halford Chessman or PCP for further recommendations. Portia notified of Dr Thompson Caul recommendations.

## 2013-07-13 NOTE — Telephone Encounter (Signed)
Spoke with Truddie Crumble at [employer].

## 2013-07-15 ENCOUNTER — Encounter (HOSPITAL_COMMUNITY)
Admission: RE | Admit: 2013-07-15 | Discharge: 2013-07-15 | Disposition: A | Discharge status: Home / Self Care | Payer: Medicare Other | Source: Ambulatory Visit | Attending: Pulmonary Disease | Admitting: Pulmonary Disease

## 2013-07-15 NOTE — Progress Notes (Signed)
Today, Joann Gutierrez exercised at [facility]. Service time was from 1030 to 1215.  The patient exercised for more than 31 minutes performing aerobic, strengthening, and stretching exercises. Oxygen saturation, heart rate, blood pressure, rate of perceived exertion, and shortness of breath were all monitored before, during, and after exercise. Joann Gutierrez presented with no problems at today's exercise session. Patient attended the class on stress and energy conservation.  There was no workload change during today's exercise session.  Pre-exercise vitals:   Weight kg: 125.6   Liters of O2: RA   SpO2: 98   HR: 93   BP: 154/60   CBG: 148  Exercise vitals:   Highest heartrate:  99   Lowest oxygen saturation: 95   Highest blood pressure: 144/62   Liters of 02: RA  Post-exercise vitals:   SpO2: 97   HR: 76   BP: 130/72   Liters of O2: RA   CBG: 142 Dr. Brand Males, Medical Director Dr. Aileen Fass is immediately available during today's Pulmonary Rehab session for [name] on 07/15/2013 at 1030 class time.

## 2013-07-20 ENCOUNTER — Encounter (HOSPITAL_COMMUNITY)
Admission: RE | Admit: 2013-07-20 | Discharge: 2013-07-20 | Disposition: A | Discharge status: Home / Self Care | Payer: Medicare Other | Source: Ambulatory Visit | Attending: Pulmonary Disease | Admitting: Pulmonary Disease

## 2013-07-20 NOTE — Progress Notes (Signed)
Today, Gwyn exercised at [facility]. Service time was from 1030 to 1215.  The patient exercised for more than 31 minutes performing aerobic, strengthening, and stretching exercises. Oxygen saturation, heart rate, blood pressure, rate of perceived exertion, and shortness of breath were all monitored before, during, and after exercise. Momo presented with no problems at today's exercise session.   There was one workload change during today's exercise session.  Pre-exercise vitals:   Weight kg: 126.9   Liters of O2: 97   SpO2: 97   HR: 93   BP: 136/54   CBG: 159  Exercise vitals:   Highest heartrate:  121   Lowest oxygen saturation: 94   Highest blood pressure: 182/60   Liters of 02: RA  Post-exercise vitals:   SpO2: 96   HR: 90   BP: 140/52   Liters of O2: RA   CBG: 136 Dr. Brand Males, Medical Director Dr. Aileen Fass is immediately available during today's Pulmonary Rehab session for [place] on 07/20/2013 at 1030 class time.

## 2013-07-21 DIAGNOSIS — R609 Edema, unspecified: Secondary | ICD-10-CM | POA: Diagnosis not present

## 2013-07-21 DIAGNOSIS — N183 Chronic kidney disease, stage 3 unspecified: Secondary | ICD-10-CM | POA: Diagnosis not present

## 2013-07-21 DIAGNOSIS — I10 Essential (primary) hypertension: Secondary | ICD-10-CM | POA: Diagnosis not present

## 2013-07-22 ENCOUNTER — Encounter (HOSPITAL_COMMUNITY)
Admission: RE | Admit: 2013-07-22 | Discharge: 2013-07-22 | Disposition: A | Discharge status: Home / Self Care | Payer: Medicare Other | Source: Ambulatory Visit | Attending: Pulmonary Disease | Admitting: Pulmonary Disease

## 2013-07-22 DIAGNOSIS — Z5189 Encounter for other specified aftercare: Secondary | ICD-10-CM | POA: Insufficient documentation

## 2013-07-22 DIAGNOSIS — G4733 Obstructive sleep apnea (adult) (pediatric): Secondary | ICD-10-CM | POA: Insufficient documentation

## 2013-07-22 DIAGNOSIS — J9610 Chronic respiratory failure, unspecified whether with hypoxia or hypercapnia: Secondary | ICD-10-CM | POA: Insufficient documentation

## 2013-07-22 DIAGNOSIS — N183 Chronic kidney disease, stage 3 unspecified: Secondary | ICD-10-CM | POA: Insufficient documentation

## 2013-07-22 DIAGNOSIS — M79609 Pain in unspecified limb: Secondary | ICD-10-CM | POA: Insufficient documentation

## 2013-07-22 DIAGNOSIS — E7800 Pure hypercholesterolemia, unspecified: Secondary | ICD-10-CM | POA: Insufficient documentation

## 2013-07-22 DIAGNOSIS — M25579 Pain in unspecified ankle and joints of unspecified foot: Secondary | ICD-10-CM | POA: Insufficient documentation

## 2013-07-22 DIAGNOSIS — I129 Hypertensive chronic kidney disease with stage 1 through stage 4 chronic kidney disease, or unspecified chronic kidney disease: Secondary | ICD-10-CM | POA: Insufficient documentation

## 2013-07-22 DIAGNOSIS — E1029 Type 1 diabetes mellitus with other diabetic kidney complication: Secondary | ICD-10-CM | POA: Insufficient documentation

## 2013-07-22 DIAGNOSIS — N058 Unspecified nephritic syndrome with other morphologic changes: Secondary | ICD-10-CM | POA: Insufficient documentation

## 2013-07-22 DIAGNOSIS — E669 Obesity, unspecified: Secondary | ICD-10-CM | POA: Insufficient documentation

## 2013-07-22 NOTE — Progress Notes (Signed)
Today, Joann Gutierrez exercised at [facility]. Service time was from 10:30am to 12:30pm.  The patient exercised for more than 31 minutes performing aerobic, strengthening, and stretching exercises. Oxygen saturation, heart rate, blood pressure, rate of perceived exertion, and shortness of breath were all monitored before, during, and after exercise. Joann Gutierrez presented with no problems at today's exercise session. Today the patient attended the education class "Oxygen Use and Safety" with Trish Fountain.  There was no workload change during today's exercise session.  Pre-exercise vitals:   Weight kg: 125.0   Liters of O2: ra   SpO2: 97   HR: 99   BP: 142/58   CBG: 179  Exercise vitals:   Highest heartrate:  116   Lowest oxygen saturation: 95   Highest blood pressure: 142/70   Liters of 02: ra  Post-exercise vitals:   SpO2: 99   HR: 97   BP: 116/64   Liters of O2: ra   CBG: 98  Dr. Brand Males, Medical Director Dr. Dyann Kief is immediately available during today's Pulmonary Rehab session for Joann Gutierrez on 07/22/13 at 10:30am class time.

## 2013-07-27 ENCOUNTER — Encounter (HOSPITAL_COMMUNITY)
Admission: RE | Admit: 2013-07-27 | Discharge: 2013-07-27 | Disposition: A | Discharge status: Home / Self Care | Payer: Medicare Other | Source: Ambulatory Visit | Attending: Pulmonary Disease | Admitting: Pulmonary Disease

## 2013-07-27 DIAGNOSIS — Z5189 Encounter for other specified aftercare: Secondary | ICD-10-CM | POA: Diagnosis not present

## 2013-07-27 NOTE — Progress Notes (Signed)
Today, Joann Gutierrez exercised at [facility]. Service time was from 10:30 to 12:15.  The patient exercised for more than 31 minutes performing aerobic, strengthening, and stretching exercises. Oxygen saturation, heart rate, blood pressure, rate of perceived exertion, and shortness of breath were all monitored before, during, and after exercise. Joann Gutierrez presented with no problems at today's exercise session.   There was no workload change during today's exercise session.  Pre-exercise vitals:   Weight kg: 128.1   Liters of O2: ra   SpO2: 95   HR: 100   BP: 140/58   CBG: 188  Exercise vitals:   Highest heartrate:  108   Lowest oxygen saturation: 95   Highest blood pressure: 152/78   Liters of 02: ra  Post-exercise vitals:   SpO2: 96   HR: 82   BP: 112/60   Liters of O2: ra   CBG: 166  Dr. Brand Males, Medical Director Dr. Dyann Kief is immediately available during today's Pulmonary Rehab session for Joann Gutierrez on 07/27/13 at 10:30am class time.

## 2013-07-29 ENCOUNTER — Encounter (HOSPITAL_COMMUNITY)
Admission: RE | Admit: 2013-07-29 | Discharge: 2013-07-29 | Disposition: A | Discharge status: Home / Self Care | Payer: Medicare Other | Source: Ambulatory Visit | Attending: Pulmonary Disease | Admitting: Pulmonary Disease

## 2013-07-29 DIAGNOSIS — Z5189 Encounter for other specified aftercare: Secondary | ICD-10-CM | POA: Diagnosis not present

## 2013-07-29 NOTE — Progress Notes (Signed)
Today, Joann Gutierrez exercised at [facility]. Service time was from 1100 to 1230.  The patient exercised for more than 31 minutes performing aerobic, strengthening, and stretching exercises. Oxygen saturation, heart rate, blood pressure, rate of perceived exertion, and shortness of breath were all monitored before, during, and after exercise. Joann Gutierrez presented with no problems at today's exercise session.   There was one workload change during today's exercise session.  Pre-exercise vitals:   Weight kg: 125.1   Liters of O2: RA   SpO2: 95   HR: 76   BP: 128/70   CBG: 149  Exercise vitals:   Highest heartrate:  106   Lowest oxygen saturation: 90   Highest blood pressure: 126/70   Liters of 02: RA  Post-exercise vitals:   SpO2: 95   HR: 72   BP: 132/70   Liters of O2: RA   CBG: 155 Dr. Brand Males, Medical Director Dr. Aileen Fass is immediately available during today's Pulmonary Rehab session for Joann Gutierrez on 07/29/2013 at 1030 class time.

## 2013-08-03 ENCOUNTER — Encounter (HOSPITAL_COMMUNITY)
Admission: RE | Admit: 2013-08-03 | Discharge: 2013-08-03 | Disposition: A | Discharge status: Home / Self Care | Payer: Medicare Other | Source: Ambulatory Visit | Attending: Pulmonary Disease | Admitting: Pulmonary Disease

## 2013-08-03 DIAGNOSIS — Z5189 Encounter for other specified aftercare: Secondary | ICD-10-CM | POA: Diagnosis not present

## 2013-08-03 NOTE — Progress Notes (Signed)
Today, Joann Gutierrez exercised at [facility]. Service time was from 1030 to 1230.  The patient exercised for more than 31 minutes performing aerobic, strengthening, and stretching exercises. Oxygen saturation, heart rate, blood pressure, rate of perceived exertion, and shortness of breath were all monitored before, during, and after exercise. Joann Gutierrez presented with no problems at today's exercise session.   There was no workload change during today's exercise session.  Pre-exercise vitals:   Weight kg: 124.8   Liters of O2: RA   SpO2: 96   HR: 87   BP: 132/64   CBG: 188  Exercise vitals:   Highest heartrate:  97   Lowest oxygen saturation: 96   Highest blood pressure: 134/64   Liters of 02: RA  Post-exercise vitals:   SpO2: 96   HR: 79   BP: 111/70   Liters of O2: RA   CBG: 143 Dr. Brand Males, Medical Director Dr. Aileen Fass is immediately available during today's Pulmonary Rehab session for [name] on 08/03/2013 at 1030  class time.

## 2013-08-05 ENCOUNTER — Encounter (HOSPITAL_COMMUNITY)
Admission: RE | Admit: 2013-08-05 | Discharge: 2013-08-05 | Disposition: A | Discharge status: Home / Self Care | Payer: Medicare Other | Source: Ambulatory Visit | Attending: Pulmonary Disease | Admitting: Pulmonary Disease

## 2013-08-05 DIAGNOSIS — Z5189 Encounter for other specified aftercare: Secondary | ICD-10-CM | POA: Diagnosis not present

## 2013-08-05 NOTE — Progress Notes (Signed)
Today, Joann Gutierrez exercised at [employer]. Service time was from 1030 to 1230.  The patient exercised for more than 31 minutes performing aerobic, strengthening, and stretching exercises. Oxygen saturation, heart rate, blood pressure, rate of perceived exertion, and shortness of breath were all monitored before, during, and after exercise. [name] presented with no problems at today's exercise session. Patient attended the nutrition for the pulmonary patient class today.  There was no workload change during today's exercise session.  Pre-exercise vitals:   Weight kg: 124.9   Liters of O2: RA   SpO2: 97   HR: 87   BP: 134/64   CBG: 114  Exercise vitals:   Highest heartrate:  97   Lowest oxygen saturation: 97   Highest blood pressure: 146/64   Liters of 02: RA  Post-exercise vitals:   SpO2: 97   HR: 76   BP: 130/70   Liters of O2: RA   CBG: 108 Dr. Brand Males, Medical Director Dr. Aileen Fass is immediately available during today's Pulmonary Rehab session for Joann Gutierrez on 08/05/2013 at 1030 class time.

## 2013-08-10 ENCOUNTER — Encounter (HOSPITAL_COMMUNITY): Payer: Medicare Other

## 2013-08-12 ENCOUNTER — Encounter (HOSPITAL_COMMUNITY): Payer: Medicare Other

## 2013-08-17 ENCOUNTER — Encounter (HOSPITAL_COMMUNITY)
Admission: RE | Admit: 2013-08-17 | Discharge: 2013-08-17 | Disposition: A | Discharge status: Home / Self Care | Payer: Medicare Other | Source: Ambulatory Visit | Attending: Pulmonary Disease | Admitting: Pulmonary Disease

## 2013-08-17 DIAGNOSIS — Z5189 Encounter for other specified aftercare: Secondary | ICD-10-CM | POA: Diagnosis not present

## 2013-08-17 NOTE — Progress Notes (Signed)
Today, Joann Gutierrez exercised at [employer]. Service time was from 10:30am to 12:00pm.  The patient exercised for more than 31 minutes performing aerobic, strengthening, and stretching exercises. Oxygen saturation, heart rate, blood pressure, rate of perceived exertion, and shortness of breath were all monitored before, during, and after exercise. Joann Gutierrez presented with no problems at today's exercise session.   There was no workload change during today's exercise session.  Pre-exercise vitals:   Weight kg: 125.1   Liters of O2: ra   SpO2: 96   HR: 86   BP: 128/64   CBG: 141  Exercise vitals:   Highest heartrate:  114   Lowest oxygen saturation: 92   Highest blood pressure: 174/76   Liters of 02: ra  Post-exercise vitals:   SpO2: 95   HR: 80   BP: 128/52   Liters of O2: ra   CBG: 153  Dr. Brand Males, Medical Director Dr. Carles Collet is immediately available during today's Pulmonary Rehab session for Joann Gutierrez on 08/17/13 at 10:30 class time.

## 2013-08-19 ENCOUNTER — Encounter (HOSPITAL_COMMUNITY)
Admission: RE | Admit: 2013-08-19 | Discharge: 2013-08-19 | Disposition: A | Discharge status: Home / Self Care | Payer: Medicare Other | Source: Ambulatory Visit | Attending: Pulmonary Disease | Admitting: Pulmonary Disease

## 2013-08-19 DIAGNOSIS — Z5189 Encounter for other specified aftercare: Secondary | ICD-10-CM | POA: Diagnosis not present

## 2013-08-19 NOTE — Progress Notes (Signed)
Today, Joann Gutierrez exercised at [employer]. Service time was from 1030 to 1215.  The patient exercised for more than 31 minutes performing aerobic, strengthening, and stretching exercises. Oxygen saturation, heart rate, blood pressure, rate of perceived exertion, and shortness of breath were all monitored before, during, and after exercise. Joann Gutierrez presented with no problems at today's exercise session. Patient attended the class on infection, heart attack, and stroke.  There was no workload change during today's exercise session.  Pre-exercise vitals:   Weight kg: 122.9   Liters of O2: RA   SpO2: 96   HR: 83   BP: 136/60   CBG: 124  Exercise vitals:   Highest heartrate:  110   Lowest oxygen saturation: 95   Highest blood pressure: 152/68   Liters of 02: RA  Post-exercise vitals:   SpO2: 96   HR: 81   BP: 120/72   Liters of O2: RA   CBG: 127 Dr. Brand Males, Medical Director Dr. Coralyn Pear is immediately available during today's Pulmonary Rehab session for [name] on 08/19/2013 at 1030 class time.

## 2013-08-24 ENCOUNTER — Encounter (HOSPITAL_COMMUNITY)
Admission: RE | Admit: 2013-08-24 | Discharge: 2013-08-24 | Disposition: A | Discharge status: Home / Self Care | Payer: Medicare Other | Source: Ambulatory Visit | Attending: Pulmonary Disease | Admitting: Pulmonary Disease

## 2013-08-24 DIAGNOSIS — E7800 Pure hypercholesterolemia, unspecified: Secondary | ICD-10-CM | POA: Insufficient documentation

## 2013-08-24 DIAGNOSIS — N183 Chronic kidney disease, stage 3 unspecified: Secondary | ICD-10-CM | POA: Diagnosis not present

## 2013-08-24 DIAGNOSIS — M25579 Pain in unspecified ankle and joints of unspecified foot: Secondary | ICD-10-CM | POA: Diagnosis not present

## 2013-08-24 DIAGNOSIS — Z5189 Encounter for other specified aftercare: Secondary | ICD-10-CM | POA: Diagnosis not present

## 2013-08-24 DIAGNOSIS — E1029 Type 1 diabetes mellitus with other diabetic kidney complication: Secondary | ICD-10-CM | POA: Diagnosis not present

## 2013-08-24 DIAGNOSIS — J9610 Chronic respiratory failure, unspecified whether with hypoxia or hypercapnia: Secondary | ICD-10-CM | POA: Diagnosis not present

## 2013-08-24 DIAGNOSIS — M79609 Pain in unspecified limb: Secondary | ICD-10-CM | POA: Insufficient documentation

## 2013-08-24 DIAGNOSIS — G4733 Obstructive sleep apnea (adult) (pediatric): Secondary | ICD-10-CM | POA: Diagnosis not present

## 2013-08-24 DIAGNOSIS — E669 Obesity, unspecified: Secondary | ICD-10-CM | POA: Insufficient documentation

## 2013-08-24 DIAGNOSIS — N058 Unspecified nephritic syndrome with other morphologic changes: Secondary | ICD-10-CM | POA: Diagnosis not present

## 2013-08-24 DIAGNOSIS — I129 Hypertensive chronic kidney disease with stage 1 through stage 4 chronic kidney disease, or unspecified chronic kidney disease: Secondary | ICD-10-CM | POA: Insufficient documentation

## 2013-08-24 NOTE — Progress Notes (Signed)
Today, Joann Gutierrez exercised at [employer]. [facility]. Service time was from 10:30 to 12:00pm.  The patient exercised for more than 31 minutes performing aerobic, strengthening, and stretching exercises. Oxygen saturation, heart rate, blood pressure, rate of perceived exertion, and shortness of breath were all monitored before, during, and after exercise. [name] presented with no problems at today's exercise session.   There was no workload change during today's exercise session.  Pre-exercise vitals:   Weight kg: 122.4   Liters of O2: ra   SpO2: 93   HR: 91   BP: 144/82   CBG: 142  Exercise vitals:   Highest heartrate:  104   Lowest oxygen saturation: 94   Highest blood pressure: 148/70   Liters of 02: ra  Post-exercise vitals:   SpO2: 97   HR: 80   BP: 120/66   Liters of O2: ra   CBG: 133  Dr. Brand Males, Medical Director Dr. Coralyn Pear is immediately available during today's Pulmonary Rehab session for Joann Gutierrez on 08/24/13 at 10:30am class time.

## 2013-08-26 ENCOUNTER — Encounter (HOSPITAL_COMMUNITY)
Admission: RE | Admit: 2013-08-26 | Discharge: 2013-08-26 | Disposition: A | Discharge status: Home / Self Care | Payer: Medicare Other | Source: Ambulatory Visit | Attending: Pulmonary Disease | Admitting: Pulmonary Disease

## 2013-08-26 DIAGNOSIS — Z5189 Encounter for other specified aftercare: Secondary | ICD-10-CM | POA: Diagnosis not present

## 2013-08-26 NOTE — Progress Notes (Signed)
Joann Gutierrez completed a Six-Minute Walk Test on 08/26/13 . Joann Gutierrez walked 894 feet with 1 break lasting 16 seconds.  The patient's lowest oxygen saturation was 92% , highest heart rate was 120bpm , and highest blood pressure was 198/60. The patient was on room air.  When [name] performed her pre walk test on 04/12/13, the patient walked 289 feet.

## 2013-08-31 ENCOUNTER — Encounter (HOSPITAL_COMMUNITY): Payer: Medicare Other

## 2013-09-02 ENCOUNTER — Encounter (HOSPITAL_COMMUNITY): Payer: Medicare Other

## 2013-09-03 ENCOUNTER — Ambulatory Visit: Payer: Medicare Other | Admitting: Podiatry

## 2013-09-07 ENCOUNTER — Encounter (HOSPITAL_COMMUNITY): Payer: Medicare Other

## 2013-09-10 ENCOUNTER — Encounter (HOSPITAL_COMMUNITY): Payer: Self-pay | Admitting: Emergency Medicine

## 2013-09-10 ENCOUNTER — Inpatient Hospital Stay (HOSPITAL_COMMUNITY)
Admission: EM | Admit: 2013-09-10 | Discharge: 2013-09-18 | DRG: 445 | Disposition: A | Discharge status: Home Health | Payer: Medicare Other | Attending: Internal Medicine | Admitting: Internal Medicine

## 2013-09-10 DIAGNOSIS — I129 Hypertensive chronic kidney disease with stage 1 through stage 4 chronic kidney disease, or unspecified chronic kidney disease: Secondary | ICD-10-CM | POA: Diagnosis present

## 2013-09-10 DIAGNOSIS — Z96659 Presence of unspecified artificial knee joint: Secondary | ICD-10-CM | POA: Diagnosis not present

## 2013-09-10 DIAGNOSIS — E7800 Pure hypercholesterolemia, unspecified: Secondary | ICD-10-CM

## 2013-09-10 DIAGNOSIS — Z79899 Other long term (current) drug therapy: Secondary | ICD-10-CM

## 2013-09-10 DIAGNOSIS — H9190 Unspecified hearing loss, unspecified ear: Secondary | ICD-10-CM | POA: Diagnosis present

## 2013-09-10 DIAGNOSIS — R7401 Elevation of levels of liver transaminase levels: Secondary | ICD-10-CM | POA: Diagnosis present

## 2013-09-10 DIAGNOSIS — R140 Abdominal distension (gaseous): Secondary | ICD-10-CM

## 2013-09-10 DIAGNOSIS — N183 Chronic kidney disease, stage 3 unspecified: Secondary | ICD-10-CM | POA: Diagnosis present

## 2013-09-10 DIAGNOSIS — R1310 Dysphagia, unspecified: Secondary | ICD-10-CM

## 2013-09-10 DIAGNOSIS — I509 Heart failure, unspecified: Secondary | ICD-10-CM | POA: Diagnosis present

## 2013-09-10 DIAGNOSIS — R748 Abnormal levels of other serum enzymes: Secondary | ICD-10-CM | POA: Diagnosis not present

## 2013-09-10 DIAGNOSIS — R0600 Dyspnea, unspecified: Secondary | ICD-10-CM

## 2013-09-10 DIAGNOSIS — E785 Hyperlipidemia, unspecified: Secondary | ICD-10-CM | POA: Diagnosis present

## 2013-09-10 DIAGNOSIS — Z6841 Body Mass Index (BMI) 40.0 and over, adult: Secondary | ICD-10-CM

## 2013-09-10 DIAGNOSIS — R059 Cough, unspecified: Secondary | ICD-10-CM

## 2013-09-10 DIAGNOSIS — R042 Hemoptysis: Secondary | ICD-10-CM

## 2013-09-10 DIAGNOSIS — I5032 Chronic diastolic (congestive) heart failure: Secondary | ICD-10-CM | POA: Diagnosis present

## 2013-09-10 DIAGNOSIS — K8020 Calculus of gallbladder without cholecystitis without obstruction: Secondary | ICD-10-CM | POA: Diagnosis not present

## 2013-09-10 DIAGNOSIS — G4733 Obstructive sleep apnea (adult) (pediatric): Secondary | ICD-10-CM | POA: Diagnosis not present

## 2013-09-10 DIAGNOSIS — I10 Essential (primary) hypertension: Secondary | ICD-10-CM | POA: Diagnosis present

## 2013-09-10 DIAGNOSIS — Z7982 Long term (current) use of aspirin: Secondary | ICD-10-CM | POA: Diagnosis not present

## 2013-09-10 DIAGNOSIS — Z800 Family history of malignant neoplasm of digestive organs: Secondary | ICD-10-CM

## 2013-09-10 DIAGNOSIS — J4489 Other specified chronic obstructive pulmonary disease: Secondary | ICD-10-CM | POA: Diagnosis present

## 2013-09-10 DIAGNOSIS — K831 Obstruction of bile duct: Principal | ICD-10-CM | POA: Diagnosis present

## 2013-09-10 DIAGNOSIS — N179 Acute kidney failure, unspecified: Secondary | ICD-10-CM | POA: Diagnosis present

## 2013-09-10 DIAGNOSIS — I2789 Other specified pulmonary heart diseases: Secondary | ICD-10-CM | POA: Diagnosis present

## 2013-09-10 DIAGNOSIS — N059 Unspecified nephritic syndrome with unspecified morphologic changes: Secondary | ICD-10-CM | POA: Diagnosis present

## 2013-09-10 DIAGNOSIS — F3289 Other specified depressive episodes: Secondary | ICD-10-CM | POA: Diagnosis present

## 2013-09-10 DIAGNOSIS — M1990 Unspecified osteoarthritis, unspecified site: Secondary | ICD-10-CM | POA: Diagnosis present

## 2013-09-10 DIAGNOSIS — R339 Retention of urine, unspecified: Secondary | ICD-10-CM | POA: Diagnosis not present

## 2013-09-10 DIAGNOSIS — E875 Hyperkalemia: Secondary | ICD-10-CM | POA: Diagnosis present

## 2013-09-10 DIAGNOSIS — Z8673 Personal history of transient ischemic attack (TIA), and cerebral infarction without residual deficits: Secondary | ICD-10-CM | POA: Diagnosis not present

## 2013-09-10 DIAGNOSIS — J449 Chronic obstructive pulmonary disease, unspecified: Secondary | ICD-10-CM | POA: Diagnosis present

## 2013-09-10 DIAGNOSIS — R05 Cough: Secondary | ICD-10-CM

## 2013-09-10 DIAGNOSIS — R740 Nonspecific elevation of levels of transaminase and lactic acid dehydrogenase [LDH]: Secondary | ICD-10-CM

## 2013-09-10 DIAGNOSIS — R569 Unspecified convulsions: Secondary | ICD-10-CM | POA: Diagnosis present

## 2013-09-10 DIAGNOSIS — Z794 Long term (current) use of insulin: Secondary | ICD-10-CM

## 2013-09-10 DIAGNOSIS — K8050 Calculus of bile duct without cholangitis or cholecystitis without obstruction: Secondary | ICD-10-CM | POA: Diagnosis present

## 2013-09-10 DIAGNOSIS — D631 Anemia in chronic kidney disease: Secondary | ICD-10-CM | POA: Diagnosis present

## 2013-09-10 DIAGNOSIS — D509 Iron deficiency anemia, unspecified: Secondary | ICD-10-CM | POA: Diagnosis not present

## 2013-09-10 DIAGNOSIS — D573 Sickle-cell trait: Secondary | ICD-10-CM | POA: Diagnosis present

## 2013-09-10 DIAGNOSIS — E870 Hyperosmolality and hypernatremia: Secondary | ICD-10-CM | POA: Diagnosis present

## 2013-09-10 DIAGNOSIS — R0989 Other specified symptoms and signs involving the circulatory and respiratory systems: Secondary | ICD-10-CM | POA: Diagnosis not present

## 2013-09-10 DIAGNOSIS — R0981 Nasal congestion: Secondary | ICD-10-CM

## 2013-09-10 DIAGNOSIS — M810 Age-related osteoporosis without current pathological fracture: Secondary | ICD-10-CM | POA: Diagnosis present

## 2013-09-10 DIAGNOSIS — E119 Type 2 diabetes mellitus without complications: Secondary | ICD-10-CM | POA: Diagnosis present

## 2013-09-10 DIAGNOSIS — R109 Unspecified abdominal pain: Secondary | ICD-10-CM

## 2013-09-10 DIAGNOSIS — K219 Gastro-esophageal reflux disease without esophagitis: Secondary | ICD-10-CM | POA: Diagnosis present

## 2013-09-10 DIAGNOSIS — M25579 Pain in unspecified ankle and joints of unspecified foot: Secondary | ICD-10-CM

## 2013-09-10 DIAGNOSIS — K222 Esophageal obstruction: Secondary | ICD-10-CM | POA: Diagnosis present

## 2013-09-10 DIAGNOSIS — I2510 Atherosclerotic heart disease of native coronary artery without angina pectoris: Secondary | ICD-10-CM | POA: Diagnosis present

## 2013-09-10 DIAGNOSIS — R112 Nausea with vomiting, unspecified: Secondary | ICD-10-CM | POA: Diagnosis not present

## 2013-09-10 DIAGNOSIS — N039 Chronic nephritic syndrome with unspecified morphologic changes: Secondary | ICD-10-CM

## 2013-09-10 DIAGNOSIS — F329 Major depressive disorder, single episode, unspecified: Secondary | ICD-10-CM | POA: Diagnosis present

## 2013-09-10 DIAGNOSIS — K3184 Gastroparesis: Secondary | ICD-10-CM

## 2013-09-10 DIAGNOSIS — Z87891 Personal history of nicotine dependence: Secondary | ICD-10-CM | POA: Diagnosis not present

## 2013-09-10 DIAGNOSIS — Z0389 Encounter for observation for other suspected diseases and conditions ruled out: Secondary | ICD-10-CM | POA: Diagnosis not present

## 2013-09-10 DIAGNOSIS — B351 Tinea unguium: Secondary | ICD-10-CM

## 2013-09-10 DIAGNOSIS — M79609 Pain in unspecified limb: Secondary | ICD-10-CM | POA: Diagnosis not present

## 2013-09-10 DIAGNOSIS — T50995A Adverse effect of other drugs, medicaments and biological substances, initial encounter: Secondary | ICD-10-CM | POA: Diagnosis present

## 2013-09-10 DIAGNOSIS — G5790 Unspecified mononeuropathy of unspecified lower limb: Secondary | ICD-10-CM

## 2013-09-10 DIAGNOSIS — R1011 Right upper quadrant pain: Secondary | ICD-10-CM | POA: Diagnosis not present

## 2013-09-10 DIAGNOSIS — R1013 Epigastric pain: Secondary | ICD-10-CM

## 2013-09-10 DIAGNOSIS — R7402 Elevation of levels of lactic acid dehydrogenase (LDH): Secondary | ICD-10-CM | POA: Diagnosis present

## 2013-09-10 DIAGNOSIS — E1029 Type 1 diabetes mellitus with other diabetic kidney complication: Secondary | ICD-10-CM

## 2013-09-10 DIAGNOSIS — R7989 Other specified abnormal findings of blood chemistry: Secondary | ICD-10-CM | POA: Diagnosis not present

## 2013-09-10 DIAGNOSIS — R51 Headache: Secondary | ICD-10-CM | POA: Diagnosis not present

## 2013-09-10 DIAGNOSIS — R079 Chest pain, unspecified: Secondary | ICD-10-CM | POA: Diagnosis not present

## 2013-09-10 MED ORDER — MORPHINE SULFATE 4 MG/ML IJ SOLN
4.0000 mg | Freq: Once | INTRAMUSCULAR | Status: AC
  Administered 2013-09-11: 4 mg via INTRAVENOUS
  Filled 2013-09-10: qty 1

## 2013-09-10 MED ORDER — ONDANSETRON HCL 4 MG/2ML IJ SOLN
4.0000 mg | Freq: Once | INTRAMUSCULAR | Status: AC
  Administered 2013-09-11: 4 mg via INTRAVENOUS
  Filled 2013-09-10: qty 2

## 2013-09-10 NOTE — ED Notes (Signed)
Per [facility] pt from home, pt called 911 due to ongoing abd discomfort that radiates up into her epigastric region. VSS 18 g LH

## 2013-09-10 NOTE — ED Notes (Signed)
Pt presents with mid abd pain starting at 1400 this afternoon, pain continued and radiated up to her mid-sternum chest region, bilateral temporal headache, dizziness, nausea, dry cough, and chills. Pt is warm to touch, pt alert and oriented x4

## 2013-09-10 NOTE — ED Notes (Signed)
MD Cheri Guppy at bedside.

## 2013-09-11 ENCOUNTER — Inpatient Hospital Stay (HOSPITAL_COMMUNITY): Payer: Medicare Other

## 2013-09-11 ENCOUNTER — Encounter (HOSPITAL_COMMUNITY): Payer: Self-pay | Admitting: Radiology

## 2013-09-11 ENCOUNTER — Emergency Department (HOSPITAL_COMMUNITY): Payer: Medicare Other

## 2013-09-11 DIAGNOSIS — J449 Chronic obstructive pulmonary disease, unspecified: Secondary | ICD-10-CM | POA: Diagnosis not present

## 2013-09-11 DIAGNOSIS — D573 Sickle-cell trait: Secondary | ICD-10-CM | POA: Diagnosis present

## 2013-09-11 DIAGNOSIS — M79609 Pain in unspecified limb: Secondary | ICD-10-CM | POA: Diagnosis not present

## 2013-09-11 DIAGNOSIS — G4733 Obstructive sleep apnea (adult) (pediatric): Secondary | ICD-10-CM | POA: Diagnosis not present

## 2013-09-11 DIAGNOSIS — D509 Iron deficiency anemia, unspecified: Secondary | ICD-10-CM | POA: Diagnosis not present

## 2013-09-11 DIAGNOSIS — N183 Chronic kidney disease, stage 3 unspecified: Secondary | ICD-10-CM | POA: Diagnosis not present

## 2013-09-11 DIAGNOSIS — Z794 Long term (current) use of insulin: Secondary | ICD-10-CM | POA: Diagnosis not present

## 2013-09-11 DIAGNOSIS — R748 Abnormal levels of other serum enzymes: Secondary | ICD-10-CM | POA: Diagnosis not present

## 2013-09-11 DIAGNOSIS — K8020 Calculus of gallbladder without cholecystitis without obstruction: Secondary | ICD-10-CM

## 2013-09-11 DIAGNOSIS — R109 Unspecified abdominal pain: Secondary | ICD-10-CM | POA: Diagnosis not present

## 2013-09-11 DIAGNOSIS — E785 Hyperlipidemia, unspecified: Secondary | ICD-10-CM | POA: Diagnosis present

## 2013-09-11 DIAGNOSIS — N039 Chronic nephritic syndrome with unspecified morphologic changes: Secondary | ICD-10-CM | POA: Diagnosis present

## 2013-09-11 DIAGNOSIS — E119 Type 2 diabetes mellitus without complications: Secondary | ICD-10-CM

## 2013-09-11 DIAGNOSIS — Z87891 Personal history of nicotine dependence: Secondary | ICD-10-CM | POA: Diagnosis not present

## 2013-09-11 DIAGNOSIS — R7401 Elevation of levels of liver transaminase levels: Secondary | ICD-10-CM | POA: Diagnosis present

## 2013-09-11 DIAGNOSIS — Z7982 Long term (current) use of aspirin: Secondary | ICD-10-CM | POA: Diagnosis not present

## 2013-09-11 DIAGNOSIS — I5032 Chronic diastolic (congestive) heart failure: Secondary | ICD-10-CM | POA: Diagnosis present

## 2013-09-11 DIAGNOSIS — Z8673 Personal history of transient ischemic attack (TIA), and cerebral infarction without residual deficits: Secondary | ICD-10-CM | POA: Diagnosis not present

## 2013-09-11 DIAGNOSIS — D631 Anemia in chronic kidney disease: Secondary | ICD-10-CM | POA: Diagnosis present

## 2013-09-11 DIAGNOSIS — I509 Heart failure, unspecified: Secondary | ICD-10-CM | POA: Diagnosis not present

## 2013-09-11 DIAGNOSIS — I129 Hypertensive chronic kidney disease with stage 1 through stage 4 chronic kidney disease, or unspecified chronic kidney disease: Secondary | ICD-10-CM | POA: Diagnosis present

## 2013-09-11 DIAGNOSIS — R1011 Right upper quadrant pain: Secondary | ICD-10-CM | POA: Diagnosis not present

## 2013-09-11 DIAGNOSIS — R1310 Dysphagia, unspecified: Secondary | ICD-10-CM

## 2013-09-11 DIAGNOSIS — R7989 Other specified abnormal findings of blood chemistry: Secondary | ICD-10-CM | POA: Diagnosis not present

## 2013-09-11 DIAGNOSIS — I2510 Atherosclerotic heart disease of native coronary artery without angina pectoris: Secondary | ICD-10-CM | POA: Diagnosis present

## 2013-09-11 DIAGNOSIS — R339 Retention of urine, unspecified: Secondary | ICD-10-CM | POA: Diagnosis not present

## 2013-09-11 DIAGNOSIS — R0989 Other specified symptoms and signs involving the circulatory and respiratory systems: Secondary | ICD-10-CM | POA: Diagnosis not present

## 2013-09-11 DIAGNOSIS — H9190 Unspecified hearing loss, unspecified ear: Secondary | ICD-10-CM | POA: Diagnosis present

## 2013-09-11 DIAGNOSIS — R7402 Elevation of levels of lactic acid dehydrogenase (LDH): Secondary | ICD-10-CM | POA: Diagnosis present

## 2013-09-11 DIAGNOSIS — Z0389 Encounter for observation for other suspected diseases and conditions ruled out: Secondary | ICD-10-CM | POA: Diagnosis not present

## 2013-09-11 DIAGNOSIS — R0609 Other forms of dyspnea: Secondary | ICD-10-CM | POA: Diagnosis not present

## 2013-09-11 DIAGNOSIS — Z96659 Presence of unspecified artificial knee joint: Secondary | ICD-10-CM | POA: Diagnosis not present

## 2013-09-11 DIAGNOSIS — M810 Age-related osteoporosis without current pathological fracture: Secondary | ICD-10-CM | POA: Diagnosis present

## 2013-09-11 DIAGNOSIS — K8050 Calculus of bile duct without cholangitis or cholecystitis without obstruction: Secondary | ICD-10-CM | POA: Diagnosis present

## 2013-09-11 DIAGNOSIS — R569 Unspecified convulsions: Secondary | ICD-10-CM | POA: Diagnosis present

## 2013-09-11 DIAGNOSIS — M1990 Unspecified osteoarthritis, unspecified site: Secondary | ICD-10-CM | POA: Diagnosis present

## 2013-09-11 DIAGNOSIS — I2789 Other specified pulmonary heart diseases: Secondary | ICD-10-CM | POA: Diagnosis present

## 2013-09-11 DIAGNOSIS — E875 Hyperkalemia: Secondary | ICD-10-CM | POA: Diagnosis not present

## 2013-09-11 DIAGNOSIS — Z79899 Other long term (current) drug therapy: Secondary | ICD-10-CM | POA: Diagnosis not present

## 2013-09-11 DIAGNOSIS — R079 Chest pain, unspecified: Secondary | ICD-10-CM | POA: Diagnosis not present

## 2013-09-11 DIAGNOSIS — F329 Major depressive disorder, single episode, unspecified: Secondary | ICD-10-CM | POA: Diagnosis present

## 2013-09-11 DIAGNOSIS — Z6841 Body Mass Index (BMI) 40.0 and over, adult: Secondary | ICD-10-CM | POA: Diagnosis not present

## 2013-09-11 DIAGNOSIS — T50995A Adverse effect of other drugs, medicaments and biological substances, initial encounter: Secondary | ICD-10-CM | POA: Diagnosis present

## 2013-09-11 DIAGNOSIS — N059 Unspecified nephritic syndrome with unspecified morphologic changes: Secondary | ICD-10-CM | POA: Diagnosis present

## 2013-09-11 DIAGNOSIS — N179 Acute kidney failure, unspecified: Secondary | ICD-10-CM | POA: Diagnosis not present

## 2013-09-11 DIAGNOSIS — K219 Gastro-esophageal reflux disease without esophagitis: Secondary | ICD-10-CM | POA: Diagnosis present

## 2013-09-11 DIAGNOSIS — R112 Nausea with vomiting, unspecified: Secondary | ICD-10-CM | POA: Diagnosis not present

## 2013-09-11 DIAGNOSIS — R51 Headache: Secondary | ICD-10-CM | POA: Diagnosis not present

## 2013-09-11 DIAGNOSIS — F3289 Other specified depressive episodes: Secondary | ICD-10-CM | POA: Diagnosis present

## 2013-09-11 DIAGNOSIS — E870 Hyperosmolality and hypernatremia: Secondary | ICD-10-CM | POA: Diagnosis not present

## 2013-09-11 DIAGNOSIS — K831 Obstruction of bile duct: Secondary | ICD-10-CM | POA: Diagnosis not present

## 2013-09-11 DIAGNOSIS — K222 Esophageal obstruction: Secondary | ICD-10-CM | POA: Diagnosis present

## 2013-09-11 LAB — COMPREHENSIVE METABOLIC PANEL
ALT: 310 U/L — ABNORMAL HIGH (ref 0–35)
ALT: 392 U/L — ABNORMAL HIGH (ref 0–35)
ANION GAP: 13 (ref 5–15)
AST: 880 U/L — AB (ref 0–37)
AST: 984 U/L — ABNORMAL HIGH (ref 0–37)
Albumin: 3 g/dL — ABNORMAL LOW (ref 3.5–5.2)
Albumin: 3.3 g/dL — ABNORMAL LOW (ref 3.5–5.2)
Alkaline Phosphatase: 246 U/L — ABNORMAL HIGH (ref 39–117)
Alkaline Phosphatase: 254 U/L — ABNORMAL HIGH (ref 39–117)
Anion gap: 13 (ref 5–15)
BILIRUBIN TOTAL: 0.9 mg/dL (ref 0.3–1.2)
BUN: 27 mg/dL — AB (ref 6–23)
BUN: 28 mg/dL — ABNORMAL HIGH (ref 6–23)
CO2: 27 meq/L (ref 19–32)
CO2: 27 meq/L (ref 19–32)
CREATININE: 1.61 mg/dL — AB (ref 0.50–1.10)
CREATININE: 1.64 mg/dL — AB (ref 0.50–1.10)
Calcium: 8.7 mg/dL (ref 8.4–10.5)
Calcium: 9 mg/dL (ref 8.4–10.5)
Chloride: 103 mEq/L (ref 96–112)
Chloride: 105 mEq/L (ref 96–112)
GFR calc Af Amer: 36 mL/min — ABNORMAL LOW (ref >90)
GFR calc Af Amer: 37 mL/min — ABNORMAL LOW (ref >90)
GFR, EST NON AFRICAN AMERICAN: 31 mL/min — AB (ref >90)
GFR, EST NON AFRICAN AMERICAN: 32 mL/min — AB (ref >90)
GLUCOSE: 200 mg/dL — AB (ref 70–99)
Glucose, Bld: 277 mg/dL — ABNORMAL HIGH (ref 70–99)
Potassium: 4.2 mEq/L (ref 3.7–5.3)
Potassium: 4.5 mEq/L (ref 3.7–5.3)
Sodium: 143 mEq/L (ref 137–147)
Sodium: 145 mEq/L (ref 137–147)
Total Bilirubin: 0.9 mg/dL (ref 0.3–1.2)
Total Protein: 7 g/dL (ref 6.0–8.3)
Total Protein: 7.5 g/dL (ref 6.0–8.3)

## 2013-09-11 LAB — CBC WITH DIFFERENTIAL/PLATELET
BASOS ABS: 0 10*3/uL (ref 0.0–0.1)
BASOS PCT: 0 % (ref 0–1)
Basophils Absolute: 0 10*3/uL (ref 0.0–0.1)
Basophils Relative: 0 % (ref 0–1)
EOS ABS: 0 10*3/uL (ref 0.0–0.7)
EOS PCT: 0 % (ref 0–5)
Eosinophils Absolute: 0 10*3/uL (ref 0.0–0.7)
Eosinophils Relative: 0 % (ref 0–5)
HCT: 29.4 % — ABNORMAL LOW (ref 36.0–46.0)
HCT: 27.9 % — ABNORMAL LOW (ref 36.0–46.0)
Hemoglobin: 9.6 g/dL — ABNORMAL LOW (ref 12.0–15.0)
Hemoglobin: 8.9 g/dL — ABNORMAL LOW (ref 12.0–15.0)
LYMPHS ABS: 0.5 10*3/uL — AB (ref 0.7–4.0)
LYMPHS PCT: 15 % (ref 12–46)
Lymphocytes Relative: 4 % — ABNORMAL LOW (ref 12–46)
Lymphs Abs: 1.8 10*3/uL (ref 0.7–4.0)
MCH: 23.2 pg — AB (ref 26.0–34.0)
MCH: 23.3 pg — AB (ref 26.0–34.0)
MCHC: 31.9 g/dL (ref 30.0–36.0)
MCHC: 32.7 g/dL (ref 30.0–36.0)
MCV: 71.2 fL — ABNORMAL LOW (ref 78.0–100.0)
MCV: 73 fL — ABNORMAL LOW (ref 78.0–100.0)
MONO ABS: 0.8 10*3/uL (ref 0.1–1.0)
Monocytes Absolute: 0.6 10*3/uL (ref 0.1–1.0)
Monocytes Relative: 6 % (ref 3–12)
Monocytes Relative: 5 % (ref 3–12)
NEUTROS PCT: 90 % — AB (ref 43–77)
Neutro Abs: 12.1 10*3/uL — ABNORMAL HIGH (ref 1.7–7.7)
Neutro Abs: 9.3 10*3/uL — ABNORMAL HIGH (ref 1.7–7.7)
Neutrophils Relative %: 79 % — ABNORMAL HIGH (ref 43–77)
PLATELETS: 269 10*3/uL (ref 150–400)
PLATELETS: 313 10*3/uL (ref 150–400)
RBC: 4.13 MIL/uL (ref 3.87–5.11)
RBC: 3.82 MIL/uL — ABNORMAL LOW (ref 3.87–5.11)
RDW: 14.6 % (ref 11.5–15.5)
RDW: 14.9 % (ref 11.5–15.5)
WBC: 13.4 10*3/uL — ABNORMAL HIGH (ref 4.0–10.5)
WBC: 11.8 10*3/uL — AB (ref 4.0–10.5)

## 2013-09-11 LAB — LIPASE, BLOOD: LIPASE: 34 U/L (ref 11–59)

## 2013-09-11 LAB — I-STAT TROPONIN, ED: TROPONIN I, POC: 0 ng/mL (ref 0.00–0.08)

## 2013-09-11 LAB — GLUCOSE, CAPILLARY
GLUCOSE-CAPILLARY: 248 mg/dL — AB (ref 70–99)
GLUCOSE-CAPILLARY: 96 mg/dL (ref 70–99)
GLUCOSE-CAPILLARY: 130 mg/dL — AB (ref 70–99)
Glucose-Capillary: 215 mg/dL — ABNORMAL HIGH (ref 70–99)
Glucose-Capillary: 141 mg/dL — ABNORMAL HIGH (ref 70–99)
Glucose-Capillary: 206 mg/dL — ABNORMAL HIGH (ref 70–99)

## 2013-09-11 LAB — I-STAT CG4 LACTIC ACID, ED: Lactic Acid, Venous: 1.5 mmol/L (ref 0.5–2.2)

## 2013-09-11 LAB — CBG MONITORING, ED: GLUCOSE-CAPILLARY: 276 mg/dL — AB (ref 70–99)

## 2013-09-11 MED ORDER — INSULIN ASPART 100 UNIT/ML ~~LOC~~ SOLN
0.0000 [IU] | Freq: Three times a day (TID) | SUBCUTANEOUS | Status: DC
  Administered 2013-09-11: 2 [IU] via SUBCUTANEOUS
  Administered 2013-09-11: 5 [IU] via SUBCUTANEOUS
  Administered 2013-09-12 – 2013-09-13 (×2): 2 [IU] via SUBCUTANEOUS
  Administered 2013-09-13 – 2013-09-14 (×2): 3 [IU] via SUBCUTANEOUS
  Administered 2013-09-14 – 2013-09-15 (×5): 2 [IU] via SUBCUTANEOUS
  Administered 2013-09-16: 3 [IU] via SUBCUTANEOUS
  Administered 2013-09-16: 2 [IU] via SUBCUTANEOUS
  Administered 2013-09-16: 3 [IU] via SUBCUTANEOUS
  Administered 2013-09-17: 2 [IU] via SUBCUTANEOUS
  Administered 2013-09-17: 5 [IU] via SUBCUTANEOUS
  Administered 2013-09-17: 3 [IU] via SUBCUTANEOUS
  Administered 2013-09-18: 2 [IU] via SUBCUTANEOUS

## 2013-09-11 MED ORDER — DONEPEZIL HCL 10 MG PO TABS
10.0000 mg | ORAL_TABLET | Freq: Every day | ORAL | Status: DC
  Administered 2013-09-11 – 2013-09-17 (×6): 10 mg via ORAL
  Filled 2013-09-11 (×11): qty 1

## 2013-09-11 MED ORDER — IOHEXOL 300 MG/ML  SOLN
25.0000 mL | INTRAMUSCULAR | Status: AC
  Administered 2013-09-11 (×2): 25 mL via ORAL

## 2013-09-11 MED ORDER — ISOSORBIDE MONONITRATE ER 60 MG PO TB24
120.0000 mg | ORAL_TABLET | Freq: Every day | ORAL | Status: DC
  Administered 2013-09-11 – 2013-09-18 (×6): 120 mg via ORAL
  Filled 2013-09-11 (×8): qty 2

## 2013-09-11 MED ORDER — DILTIAZEM HCL ER COATED BEADS 240 MG PO CP24
240.0000 mg | ORAL_CAPSULE | Freq: Every day | ORAL | Status: DC
  Administered 2013-09-13 – 2013-09-18 (×5): 240 mg via ORAL
  Filled 2013-09-11 (×8): qty 1

## 2013-09-11 MED ORDER — LORAZEPAM 2 MG/ML IJ SOLN
0.5000 mg | Freq: Once | INTRAMUSCULAR | Status: AC
  Administered 2013-09-11: 0.5 mg via INTRAVENOUS
  Filled 2013-09-11: qty 1

## 2013-09-11 MED ORDER — ALBUTEROL SULFATE (2.5 MG/3ML) 0.083% IN NEBU: 2.5000 mg | INHALATION_SOLUTION | Freq: Four times a day (QID) | RESPIRATORY_TRACT | Status: DC | PRN

## 2013-09-11 MED ORDER — POTASSIUM CHLORIDE IN NACL 20-0.9 MEQ/L-% IV SOLN
INTRAVENOUS | Status: DC
Start: 2013-09-11 — End: 2013-09-12
  Administered 2013-09-11: 1000 mL via INTRAVENOUS
  Administered 2013-09-11: 09:00:00 via INTRAVENOUS
  Filled 2013-09-11 (×3): qty 1000

## 2013-09-11 MED ORDER — ATORVASTATIN CALCIUM 40 MG PO TABS
40.0000 mg | ORAL_TABLET | Freq: Every day | ORAL | Status: DC
  Administered 2013-09-11 – 2013-09-18 (×6): 40 mg via ORAL
  Filled 2013-09-11 (×9): qty 1

## 2013-09-11 MED ORDER — LOSARTAN POTASSIUM 50 MG PO TABS
100.0000 mg | ORAL_TABLET | Freq: Every day | ORAL | Status: DC
  Administered 2013-09-11: 100 mg via ORAL
  Filled 2013-09-11: qty 2

## 2013-09-11 MED ORDER — PANTOPRAZOLE SODIUM 40 MG IV SOLR
40.0000 mg | Freq: Every day | INTRAVENOUS | Status: DC
Start: 2013-09-11 — End: 2013-09-16
  Administered 2013-09-11 – 2013-09-15 (×5): 40 mg via INTRAVENOUS
  Filled 2013-09-11 (×8): qty 40

## 2013-09-11 MED ORDER — RALOXIFENE HCL 60 MG PO TABS
60.0000 mg | ORAL_TABLET | Freq: Every day | ORAL | Status: DC
Start: 2013-09-11 — End: 2013-09-18
  Administered 2013-09-11 – 2013-09-18 (×6): 60 mg via ORAL
  Filled 2013-09-11 (×8): qty 1

## 2013-09-11 MED ORDER — METOLAZONE 2.5 MG PO TABS
2.5000 mg | ORAL_TABLET | Freq: Every day | ORAL | Status: DC
  Administered 2013-09-11: 2.5 mg via ORAL
  Filled 2013-09-11 (×2): qty 1

## 2013-09-11 MED ORDER — BUDESONIDE-FORMOTEROL FUMARATE 160-4.5 MCG/ACT IN AERO
2.0000 | INHALATION_SPRAY | Freq: Two times a day (BID) | RESPIRATORY_TRACT | Status: DC
  Administered 2013-09-12 – 2013-09-18 (×12): 2 via RESPIRATORY_TRACT
  Filled 2013-09-11 (×2): qty 6

## 2013-09-11 MED ORDER — HYDRALAZINE HCL 50 MG PO TABS
100.0000 mg | ORAL_TABLET | Freq: Three times a day (TID) | ORAL | Status: DC
  Administered 2013-09-11 – 2013-09-18 (×17): 100 mg via ORAL
  Filled 2013-09-11 (×29): qty 2

## 2013-09-11 MED ORDER — FLUTICASONE PROPIONATE 50 MCG/ACT NA SUSP
2.0000 | Freq: Every day | NASAL | Status: DC
  Administered 2013-09-11 – 2013-09-18 (×4): 2 via NASAL
  Filled 2013-09-11: qty 16

## 2013-09-11 MED ORDER — MORPHINE SULFATE 4 MG/ML IJ SOLN
4.0000 mg | Freq: Once | INTRAMUSCULAR | Status: AC
  Administered 2013-09-11: 4 mg via INTRAVENOUS
  Filled 2013-09-11: qty 1

## 2013-09-11 MED ORDER — CITALOPRAM HYDROBROMIDE 20 MG PO TABS
20.0000 mg | ORAL_TABLET | Freq: Every day | ORAL | Status: DC
  Administered 2013-09-11 – 2013-09-18 (×6): 20 mg via ORAL
  Filled 2013-09-11 (×9): qty 1

## 2013-09-11 MED ORDER — MOMETASONE FURO-FORMOTEROL FUM 100-5 MCG/ACT IN AERO
2.0000 | INHALATION_SPRAY | Freq: Two times a day (BID) | RESPIRATORY_TRACT | Status: DC
  Administered 2013-09-11 – 2013-09-13 (×5): 2 via RESPIRATORY_TRACT
  Filled 2013-09-11 (×3): qty 8.8

## 2013-09-11 MED ORDER — EZETIMIBE 10 MG PO TABS
10.0000 mg | ORAL_TABLET | Freq: Every day | ORAL | Status: DC
  Administered 2013-09-11 – 2013-09-18 (×6): 10 mg via ORAL
  Filled 2013-09-11 (×9): qty 1

## 2013-09-11 MED ORDER — MORPHINE SULFATE 2 MG/ML IJ SOLN
2.0000 mg | INTRAMUSCULAR | Status: DC | PRN
  Administered 2013-09-11 – 2013-09-13 (×7): 2 mg via INTRAVENOUS
  Filled 2013-09-11 (×7): qty 1

## 2013-09-11 MED ORDER — NITROGLYCERIN 0.4 MG SL SUBL
0.4000 mg | SUBLINGUAL_TABLET | SUBLINGUAL | Status: DC | PRN
  Administered 2013-09-16 (×2): 0.4 mg via SUBLINGUAL
  Filled 2013-09-11 (×2): qty 1

## 2013-09-11 MED ORDER — FUROSEMIDE 80 MG PO TABS
80.0000 mg | ORAL_TABLET | Freq: Two times a day (BID) | ORAL | Status: DC
  Administered 2013-09-11 – 2013-09-12 (×3): 80 mg via ORAL
  Filled 2013-09-11 (×5): qty 1

## 2013-09-11 MED ORDER — MONTELUKAST SODIUM 10 MG PO TABS
10.0000 mg | ORAL_TABLET | Freq: Every day | ORAL | Status: DC
  Administered 2013-09-11 – 2013-09-18 (×6): 10 mg via ORAL
  Filled 2013-09-11 (×9): qty 1

## 2013-09-11 MED ORDER — INSULIN GLARGINE 100 UNIT/ML ~~LOC~~ SOLN
15.0000 [IU] | Freq: Every day | SUBCUTANEOUS | Status: DC
  Administered 2013-09-11 – 2013-09-18 (×8): 15 [IU] via SUBCUTANEOUS
  Filled 2013-09-11 (×10): qty 0.15

## 2013-09-11 MED ORDER — GABAPENTIN 300 MG PO CAPS
300.0000 mg | ORAL_CAPSULE | Freq: Two times a day (BID) | ORAL | Status: DC
  Administered 2013-09-11 – 2013-09-13 (×4): 300 mg via ORAL
  Filled 2013-09-11 (×8): qty 1

## 2013-09-11 MED ORDER — PNEUMOCOCCAL VAC POLYVALENT 25 MCG/0.5ML IJ INJ
0.5000 mL | INJECTION | INTRAMUSCULAR | Status: AC
  Administered 2013-09-12: 0.5 mL via INTRAMUSCULAR
  Filled 2013-09-11: qty 0.5

## 2013-09-11 MED ORDER — IOHEXOL 300 MG/ML  SOLN
80.0000 mL | Freq: Once | INTRAMUSCULAR | Status: AC | PRN
  Administered 2013-09-11: 80 mL via INTRAVENOUS

## 2013-09-11 MED ORDER — ONDANSETRON HCL 4 MG/2ML IJ SOLN
4.0000 mg | Freq: Four times a day (QID) | INTRAMUSCULAR | Status: DC | PRN
  Administered 2013-09-11 – 2013-09-17 (×8): 4 mg via INTRAVENOUS
  Filled 2013-09-11 (×7): qty 2

## 2013-09-11 MED ORDER — ALBUTEROL SULFATE HFA 108 (90 BASE) MCG/ACT IN AERS: 2.0000 | INHALATION_SPRAY | Freq: Four times a day (QID) | RESPIRATORY_TRACT | Status: DC | PRN

## 2013-09-11 NOTE — Progress Notes (Signed)
Pt c/o chest pain and SOB.  VSS.  EKG is NSR.  MD notified and new orders placed. [name]

## 2013-09-11 NOTE — Progress Notes (Signed)
Pt taken down for CT.  CT notified that pt also has order for stat cxr.  CT said they will send her to xray afterwards. Joann Gutierrez

## 2013-09-11 NOTE — H&P (Signed)
Joann Gutierrez is an 68 y.o. female.   Chief Complaint: abdominal pain HPI: the patient is a 68 year old black female who presents to the emergency department with right upper quadrant pain and shortness of breath that started about a day or 2 ago. She has had some nausea but no vomiting. She underwent an ultrasound which did not visualize her gallbladder. Her liver functions were significantly elevated though.  Past Medical History  Diagnosis Date  . Allergy   . Depression   . Diabetes mellitus 1997    Type II   . GERD (gastroesophageal reflux disease)   . Hypertension   . Hyperlipidemia   . Osteoarthritis   . Osteoporosis   . Hair loss   . Anemia   . CAD (coronary artery disease)     Mild very minimal coronary disease with 20% obtuse marginal stenosis  . CVA (cerebral infarction)   . Diverticulosis   . Esophageal stricture   . Gastritis   . Adenomatous colon polyp   . Sickle cell trait   . Asthma        . COPD (chronic obstructive pulmonary disease)   . 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 Gastroparesis 08/21/2007  . RENAL INSUFFICIENCY 02/16/2009  . OSTEOARTHRITIS 08/09/2006  . Morbid obesity   . CHF (congestive heart failure)   . Carpal tunnel syndrome on left   . 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 Hernia, hiatal   . Seizures     pt thinks it has been several monthes since she had a seisure  . Stroke   . Hearing loss of both ears   . 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 Chronic kidney disease (CKD), stage III (moderate)     Past Surgical History  Procedure Laterality Date  . Abdominal hysterectomy    . Replacement total knee  1998    Left  . Esophagogastroduodenoscopy  06/25/2006  . Erd  08/08/2000  . Artery biopsy  01/07/2011    Procedure: MINOR BIOPSY TEMPORAL ARTERY;  Surgeon: Haywood Lasso, MD;  Location: [facility];  Service: General;  Laterality: Left;  left temporal artery biopsy  . Breast lumpectomy       benign  . Breast lumpectomy      both breast lumps removed   . Hernia repair    . Colonoscopy    . Upper gastrointestinal endoscopy    . Bil foot surgery      Family History  Problem Relation Age of Onset  . Colon cancer Brother   . Cancer Brother     Colon Cancer  . Cancer Mother     Liver Cancer  . Diabetes Mother   . Kidney disease Mother   . Heart disease Mother     age 78's  . Heart disease Father 79    MI  . Heart attack Father     died of MI when pt was 64  . Sickle cell anemia Father   . Diabetes Sister   . Kidney disease Sister   . Heart disease Sister     age 67's  . Allergies Sister   . Diabetes Sister   . Kidney disease Sister   . Heart disease Sister     age 16's  . Esophageal cancer Neg Hx   . Rectal cancer Neg Hx   . Stomach cancer Neg Hx    Social History:  reports that she quit smoking about 33 years ago. She has  never used smokeless tobacco. She reports that she does not drink alcohol or use illicit drugs.  Allergies:  Allergies  Allergen Reactions  . Promethazine Hcl     REACTION: lethargy     (Not in a hospital admission)  Results for orders placed during the hospital encounter of 09/10/13 (from the past 48 hour(s))  CBC WITH DIFFERENTIAL     Status: Abnormal   Collection Time    09/10/13 11:59 PM      Result Value Ref Range   WBC 13.4 (*) 4.0 - 10.5 K/uL   RBC 4.13  3.87 - 5.11 MIL/uL   Hemoglobin 9.6 (*) 12.0 - 15.0 g/dL   HCT 29.4 (*) 36.0 - 46.0 %   MCV 71.2 (*) 78.0 - 100.0 fL   MCH 23.2 (*) 26.0 - 34.0 pg   MCHC 32.7  30.0 - 36.0 g/dL   RDW 14.6  11.5 - 15.5 %   Platelets 313  150 - 400 K/uL   Neutrophils Relative % 90 (*) 43 - 77 %   Lymphocytes Relative 4 (*) 12 - 46 %   Monocytes Relative 6  3 - 12 %   Eosinophils Relative 0  0 - 5 %   Basophils Relative 0  0 - 1 %   Neutro Abs 12.1 (*) 1.7 - 7.7 K/uL   Lymphs Abs 0.5 (*) 0.7 - 4.0 K/uL   Monocytes Absolute 0.8  0.1 - 1.0 K/uL   Eosinophils Absolute 0.0  0.0 - 0.7  K/uL   Basophils Absolute 0.0  0.0 - 0.1 K/uL   RBC Morphology TARGET CELLS     WBC Morphology MILD LEFT SHIFT (1-5% METAS, OCC MYELO, OCC BANDS)    COMPREHENSIVE METABOLIC PANEL     Status: Abnormal   Collection Time    09/10/13 11:59 PM      Result Value Ref Range   Sodium 143  137 - 147 mEq/L   Potassium 4.2  3.7 - 5.3 mEq/L   Chloride 103  96 - 112 mEq/L   CO2 27  19 - 32 mEq/L   Glucose, Bld 277 (*) 70 - 99 mg/dL   BUN 27 (*) 6 - 23 mg/dL   Creatinine, Ser 1.61 (*) 0.50 - 1.10 mg/dL   Calcium 9.0  8.4 - 10.5 mg/dL   Total Protein 7.5  6.0 - 8.3 g/dL   Albumin 3.3 (*) 3.5 - 5.2 g/dL   AST 984 (*) 0 - 37 U/L   ALT 310 (*) 0 - 35 U/L   Alkaline Phosphatase 254 (*) 39 - 117 U/L   Total Bilirubin 0.9  0.3 - 1.2 mg/dL   GFR calc non Af Amer 32 (*) >90 mL/min   GFR calc Af Amer 37 (*) >90 mL/min   Comment: (NOTE)     The eGFR has been calculated using the CKD EPI equation.     This calculation has not been validated in all clinical situations.     eGFR's persistently <90 mL/min signify possible Chronic Kidney     Disease.   Anion gap 13  5 - 15  LIPASE, BLOOD     Status: None   Collection Time    09/10/13 11:59 PM      Result Value Ref Range   Lipase 34  11 - 59 U/L  Randolm Idol, ED     Status: None   Collection Time    09/11/13 12:29 AM      Result Value Ref Range  Troponin i, poc 0.00  0.00 - 0.08 ng/mL   Comment 3            Comment: Due to the release kinetics of cTnI,     a negative result within the first hours     of the onset of symptoms does not rule out     myocardial infarction with certainty.     If myocardial infarction is still suspected,     repeat the test at appropriate intervals.  I-STAT CG4 LACTIC ACID, ED     Status: None   Collection Time    09/11/13 12:32 AM      Result Value Ref Range   Lactic Acid, Venous 1.50  0.5 - 2.2 mmol/L  CBG MONITORING, ED     Status: Abnormal   Collection Time    09/11/13  4:49 AM      Result Value Ref Range    Glucose-Capillary 276 (*) 70 - 99 mg/dL   US Abdomen Limited  09/11/2013   CLINICAL DATA:  68 year old female with abdominal pain, right upper quadrant pain, abnormal LFTs. Initial encounter.  EXAM: US ABDOMEN LIMITED - RIGHT UPPER QUADRANT  COMPARISON:  CT Abdomen and Pelvis 01/30/2009.  FINDINGS: Gallbladder:  Not visualized, presumably contracted.  Common bile duct:  Diameter: 6 mm, normal.  Liver:  No focal lesion identified. Within normal limits in parenchymal echogenicity.  IMPRESSION: 1. Gallbladder not identified, presumably contracted. 2. Negative sonographic appearance of the liver and CBD.   Electronically Signed   By: Lars Pinks M.D.   On: 09/11/2013 02:18   Dg Abd Acute W/chest  09/11/2013   CLINICAL DATA:  Mid chest and lower abdominal pain  EXAM: ACUTE ABDOMEN SERIES (ABDOMEN 2 VIEW & CHEST 1 VIEW)  COMPARISON:  Prior radiograph from 04/02/2013  FINDINGS: Borderline cardiomegaly is stable as compared to prior study. Mediastinal silhouette within normal limits. Mild tortuosity of the intrathoracic aorta noted.  Lung volumes within normal limits. There is mild perihilar vascular congestion without overt pulmonary edema. No focal infiltrate or pleural effusion. No pneumothorax.  Visualized bowel gas pattern is within normal limits without evidence of obstruction or ileus. No abnormal bowel wall thickening. No free intraperitoneal air.  No soft tissue mass or abnormal calcification.  No acute osseus abnormality.  IMPRESSION: 1. Nonobstructive bowel gas pattern with no radiographic evidence acute intra-abdominal abnormality. 2. Borderline cardiomegaly with mild perihilar vascular congestion without overt pulmonary edema.   Electronically Signed   By: Jeannine Boga M.D.   On: 09/11/2013 01:11    Review of Systems  Constitutional: Negative.   HENT: Negative.   Eyes: Negative.   Respiratory: Positive for shortness of breath.   Cardiovascular: Negative.   Gastrointestinal: Positive for  nausea and abdominal pain. Negative for vomiting.  Genitourinary: Negative.   Musculoskeletal: Negative.   Skin: Negative.   Neurological: Negative.   Endo/Heme/Allergies: Negative.   Psychiatric/Behavioral: Negative.     Blood pressure 133/58, pulse 71, temperature 100.9 F (38.3 C), temperature source Rectal, resp. rate 16, height _0  (1.626 m), weight 270 lb (122.471 kg), SpO2 99.00%. Physical Exam  Constitutional: She is oriented to person, place, and time.  Obese bf in nad  HENT:  Head: Normocephalic and atraumatic.  Eyes: Conjunctivae and EOM are normal. Pupils are equal, round, and reactive to light.  Neck: Normal range of motion. Neck supple.  Cardiovascular: Normal rate, regular rhythm and normal heart sounds.   Respiratory: Effort normal and breath sounds normal.  GI:  Soft. Bowel sounds are normal.  There is epigastric and RUQ tenderness  Musculoskeletal: Normal range of motion.  Neurological: She is alert and oriented to person, place, and time.  Skin: Skin is warm and dry.  Psychiatric: She has a normal mood and affect. Her behavior is normal.     Assessment/Plan The patient appears to have biliary colic with elevated liver functions although the gallbladder was poorly visualized. At this point a then she should be admitted for bowel rest and repeat liver functions. If the liver functions did not return to normal then she may need a gastroenterology consult and possible ERCP. She also may need to have another imaging study to look more closely at her gallbladder.  TOTH III,Leveta Wahab S 09/11/2013, 5:31 AM

## 2013-09-11 NOTE — Progress Notes (Signed)
Bladder scanned pt per order and scan revealed 136mL in bladder.  [facility] notified. Joann Gutierrez

## 2013-09-11 NOTE — Consult Note (Signed)
[facility] Medical Consultation  Joann Gutierrez MRN:[id number] DOB: 11/01/1945 DOA: 09/10/2013 PCP: POLITE,RONALD D, MD   Requesting physician: Dr. Wilson Date of consultation: 09/11/13 Reason for consultation: Medical Management  Impression/Recommendations Active Problems:   DIABETES MELLITUS, TYPE II   CKD (chronic kidney disease), stage III   Hypertension   Biliary colic  1. CKD 3 1. Baseline Cr appears to be 1.2-1.5, with Cr of 1.64 currently 2. SBP <100 at this time, will therefore hold ARB and metolazone for now (see below) 3. Will cautiously continue lasix for now, however if renal function worsens, would consider holding further diuretics 4. Agree with continued gentle IVF hydration 5. Follow renal function closely 2. HTN 1. Per above, sbp is <100 presently 2. Hold ARB and metolazone 3. Cont to monitor for now 3. DM 2 1. On SSI coverage alone currently 2. Pt is on 30 units Lantus at baseline 3. As pt is currently NPO, will resume on 1/2 dose to ensure continued basal coverage 4. CAD 1. Stable 2. Prior records reviewed 3. Pt has been follow up multiple times for chest pain as an outpatient, s/p cath in 02/2013 revealing only minimal disease 5. COPD 1. Stable 6. Documented hx of CHF 1. Uncertain of this diagnosis 2. Per above, recent cath in 2/15 demonstrated an EF of 70% on ventriculogram 3. Pt has been continued on chronic lasix 80mg bid since at least 2011  4. Will continue lasix for now, but hold metolazone (see above) 7. Biliary obstruction with transaminitis 1. Per primary surgical service 2. CT abd pending 3. LFT's appear to be trending down slowly overnight  I will followup again tomorrow. Please contact me if I can be of assistance in the meanwhile. Thank you for this consultation.  Chief Complaint: Abd Pain  HPI:  68yo female who presented to the ED with complaints of RUQ pain and increased work of breathing. In the ED, pt was found to have  markedly elevated LFT's including alk phos. An abd US revealed a normal appearing liver and CBG but failed to identify a gallbladder. The patient was noted to also have mild leukocytosis of 13k and a low grade temp of 100.9F on admission. The patient was subsequently admitted to the General Surgery service for further work up of biliary colic with transaminitis. The hospitalist service was consulted for consulted for medical management given the complexity of the patient.  Review of Systems:  Per above, the remainder of the 10pt ros reviewed and are neg  Past Medical History  Diagnosis Date  . Allergy   . Depression   . Diabetes mellitus 1997    Type II   . GERD (gastroesophageal reflux disease)   . Hypertension   . Hyperlipidemia   . Osteoarthritis   . Osteoporosis   . Hair loss   . Anemia   . CAD (coronary artery disease)     Mild very minimal coronary disease with 20% obtuse marginal stenosis  . CVA (cerebral infarction)   . Diverticulosis   . Esophageal stricture   . Gastritis   . Adenomatous colon polyp   . Sickle cell trait   . Asthma        . COPD (chronic obstructive pulmonary disease)   . 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 Gastroparesis 08/21/2007  . RENAL INSUFFICIENCY 02/16/2009  . OSTEOARTHRITIS 08/09/2006  . Morbid obesity   . CHF (congestive heart failure)   . Carpal tunnel syndrome on left   . PERIPHERAL NEUROPATHY, FEET   09/23/2007  . Hernia, hiatal   . Seizures     pt thinks it has been several monthes since she had a seisure  . Stroke   . Hearing loss of both ears   . 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 Chronic kidney disease (CKD), stage III (moderate)    Past Surgical History  Procedure Laterality Date  . Abdominal hysterectomy    . Replacement total knee  1998    Left  . Esophagogastroduodenoscopy  06/25/2006  . Erd  08/08/2000  . Artery biopsy  01/07/2011    Procedure: MINOR BIOPSY TEMPORAL ARTERY;  Surgeon: Haywood Lasso, MD;  Location: [place];  Service: General;  Laterality: Left;  left temporal artery biopsy  . Breast lumpectomy      benign  . Breast lumpectomy      both breast lumps removed   . Hernia repair    . Colonoscopy    . Upper gastrointestinal endoscopy    . Bil foot surgery     Social History:  reports that she quit smoking about 33 years ago. She has never used smokeless tobacco. She reports that she does not drink alcohol or use illicit drugs.  Allergies  Allergen Reactions  . Promethazine Hcl     REACTION: lethargy   Family History  Problem Relation Age of Onset  . Colon cancer Brother   . Cancer Brother     Colon Cancer  . Cancer Mother     Liver Cancer  . Diabetes Mother   . Kidney disease Mother   . Heart disease Mother     age 50's  . Heart disease Father 41    MI  . Heart attack Father     died of MI when pt was 60  . Sickle cell anemia Father   . Diabetes Sister   . Kidney disease Sister   . Heart disease Sister     age 44's  . Allergies Sister   . Diabetes Sister   . Kidney disease Sister   . Heart disease Sister     age 80's  . Esophageal cancer Neg Hx   . Rectal cancer Neg Hx   . Stomach cancer Neg Hx     Prior to Admission medications   Medication Sig Start Date End Date Taking? Authorizing Provider  albuterol (PROVENTIL) (2.5 MG/3ML) 0.083% nebulizer solution Take 2.5 mg by nebulization every 6 (six) hours as needed for wheezing or shortness of breath.   Yes Historical Provider, MD  albuterol (VENTOLIN HFA) 108 (90 BASE) MCG/ACT inhaler Inhale 2 puffs into the lungs every 6 (six) hours as needed for shortness of breath. 07/07/12  Yes [name], MD  aspirin 325 MG tablet Take 325 mg by mouth daily.   Yes Historical Provider, MD  aspirin 81 MG tablet Take 81 mg by mouth daily.    Yes Historical Provider, MD  atorvastatin (LIPITOR) 40 MG tablet Take 1 tablet (40 mg total) by mouth daily. 09/07/10  Yes Minus Breeding, MD   budesonide-formoterol [facility]) 160-4.5 MCG/ACT inhaler Inhale 2 puffs into the lungs 2 (two) times daily. 07/08/13  Yes [name], MD  Calcium Carbonate-Vit D-Min (CALTRATE PLUS PO) Take 600 mg by mouth daily.     Yes Historical Provider, MD  cyclobenzaprine (FLEXERIL) 10 MG tablet Take 1 tablet (10 mg total) by mouth daily. 04/29/11  Yes Renato Shin, MD  diclofenac (VOLTAREN) 75 MG EC tablet Take 1 tablet  by mouth Daily. 04/25/11  Yes Historical Provider, MD  diltiazem (CARDIZEM CD) 240 MG 24 hr capsule Take 240 mg by mouth daily.     Yes Historical Provider, MD  donepezil (ARICEPT) 10 MG tablet Take 10 mg by mouth at bedtime.   Yes Historical Provider, MD  ezetimibe (ZETIA) 10 MG tablet Take 10 mg by mouth daily.     Yes Historical Provider, MD  fluticasone (FLONASE) 50 MCG/ACT nasal spray Place 2 sprays into both nostrils daily.   Yes Historical Provider, MD  furosemide (LASIX) 80 MG tablet Take 80 mg by mouth 2 (two) times daily. 05/24/11  Yes Anand D Hongalgi, MD  gabapentin (NEURONTIN) 300 MG capsule Take 1 capsule (300 mg total) by mouth 2 (two) times daily. 04/29/11  Yes Sean Ellison, MD  hydrALAZINE (APRESOLINE) 100 MG tablet Take 100 mg by mouth 3 (three) times daily.   Yes Historical Provider, MD  insulin glargine (LANTUS SOLOSTAR) 100 UNIT/ML injection Inject 30 Units into the skin every morning. And pen needles 1/day   Yes Historical Provider, MD  insulin NPH (HUMULIN N) 100 UNIT/ML injection Inject 2-13 Units into the skin 3 (three) times daily after meals. Sliding scale with each meal 07/07/12  Yes Sean Ellison, MD  isosorbide mononitrate (IMDUR) 120 MG 24 hr tablet Take 1 tablet (120 mg total) by mouth daily. 01/25/13  Yes James Hochrein, MD  KLOR-CON M20 20 MEQ tablet Take 1 tablet by mouth Daily. 06/19/11  Yes Historical Provider, MD  LIDODERM 5 % Place 1 patch onto the skin daily. As needed for pain 10/20/11  Yes Historical Provider, MD  losartan (COZAAR) 100 MG tablet Take 100 mg by  mouth daily.   Yes Historical Provider, MD  metoCLOPramide (REGLAN) 10 MG tablet Take 1 tablet (10 mg total) by mouth 4 (four) times daily. 07/07/12  Yes Robert D Kaplan, MD  metolazone (ZAROXOLYN) 2.5 MG tablet Take 1 tablet by mouth Daily. 06/19/11  Yes Historical Provider, MD  mometasone-formoterol (DULERA) 100-5 MCG/ACT AERO Inhale 2 puffs into the lungs 2 (two) times daily.   Yes Historical Provider, MD  montelukast (SINGULAIR) 10 MG tablet Take 1 tablet (10 mg total) by mouth daily. 07/07/12  Yes Vineet Sood, MD  Multiple Vitamin (MULTIVITAMIN PO) Take 1 tablet by mouth daily.     Yes Historical Provider, MD  nitroGLYCERIN (NITROLINGUAL) 0.4 MG/SPRAY spray Place 1 spray under the tongue every 5 (five) minutes x 3 doses as needed for chest pain.   Yes Historical Provider, MD  omeprazole (PRILOSEC) 20 MG capsule Take 20 mg by mouth daily. 08/31/12  Yes Robert D Kaplan, MD  raloxifene (EVISTA) 60 MG tablet Take 60 mg by mouth daily.     Yes Historical Provider, MD  sucralfate (CARAFATE) 1 GM/10ML suspension Take 10 mLs (1 g total) by mouth 2 (two) times daily. 07/07/12  Yes Robert D Kaplan, MD  TraMADol HCl 50 MG TBSO Take 1 tablet by mouth 4 (four) times daily as needed (for pain).    Yes Historical Provider, MD  citalopram (CELEXA) 20 MG tablet Take 20 mg by mouth daily.     Historical Provider, MD  fluticasone (FLONASE) 50 MCG/ACT nasal spray Place 2 sprays into the nose daily. 01/16/12 04/02/13  Vineet Sood, MD   Physical Exam: Blood pressure 94/41, pulse 80, temperature 97.4 F (36.3 C), temperature source Oral, resp. rate 18, height 5' 4" (1.626 m), weight 122.4 kg (269 lb 13.5 oz), SpO2 100.00%. Filed Vitals:   09/11/13   0841 09/11/13 0950 09/11/13 1024 09/11/13 1323  BP: 119/59 124/58 110/44 94/41  Pulse:  71  80  Temp:  97.7 F (36.5 C)  97.4 F (36.3 C)  TempSrc:  Oral  Oral  Resp:  16  18  Height:      Weight:      SpO2:  99%  100%     General:  Awake, in nad  Eyes: PERRL  B  ENT: membranes moist, dentition fair  Neck: trachea midline, neck supple  Cardiovascular: regular, s1, s2  Respiratory: normal resp effort, no wheezing  Abdomen: diffusely tender, pos BS  Skin: normal skin turgor, no abnormal skin lesions seen  Musculoskeletal: perfused, no clubbing, B LE tenderness  Psychiatric: mood/affect normal// no auditory/visual hallucinations  Neurologic: cn2-12 grossly intact, strength/sensation intact  Labs on Admission:  Basic Metabolic Panel:  Recent Labs Lab 09/10/13 2359 09/11/13 0900  NA 143 145  K 4.2 4.5  CL 103 105  CO2 27 27  GLUCOSE 277* 200*  BUN 27* 28*  CREATININE 1.61* 1.64*  CALCIUM 9.0 8.7   Liver Function Tests:  Recent Labs Lab 09/10/13 2359 09/11/13 0900  AST 984* 880*  ALT 310* 392*  ALKPHOS 254* 246*  BILITOT 0.9 0.9  PROT 7.5 7.0  ALBUMIN 3.3* 3.0*    Recent Labs Lab 09/10/13 2359  LIPASE 34   No results found for this basename: AMMONIA,  in the last 168 hours CBC:  Recent Labs Lab 09/10/13 2359 09/11/13 0900  WBC 13.4* 11.8*  NEUTROABS 12.1* 9.3*  HGB 9.6* 8.9*  HCT 29.4* 27.9*  MCV 71.2* 73.0*  PLT 313 269   Cardiac Enzymes: No results found for this basename: CKTOTAL, CKMB, CKMBINDEX, TROPONINI,  in the last 168 hours BNP: No components found with this basename: POCBNP,  CBG:  Recent Labs Lab 09/11/13 0449 09/11/13 0628 09/11/13 0816  GLUCAP 276* 248* 215*    Radiological Exams on Admission: Us Abdomen Limited  09/11/2013   CLINICAL DATA:  68-year-old female with abdominal pain, right upper quadrant pain, abnormal LFTs. Initial encounter.  EXAM: US ABDOMEN LIMITED - RIGHT UPPER QUADRANT  COMPARISON:  CT Abdomen and Pelvis 01/30/2009.  FINDINGS: Gallbladder:  Not visualized, presumably contracted.  Common bile duct:  Diameter: 6 mm, normal.  Liver:  No focal lesion identified. Within normal limits in parenchymal echogenicity.  IMPRESSION: 1. Gallbladder not identified, presumably  contracted. 2. Negative sonographic appearance of the liver and CBD.   Electronically Signed   By: Lee  Hall M.D.   On: 09/11/2013 02:18   Dg Abd Acute W/chest  09/11/2013   CLINICAL DATA:  Mid chest and lower abdominal pain  EXAM: ACUTE ABDOMEN SERIES (ABDOMEN 2 VIEW & CHEST 1 VIEW)  COMPARISON:  Prior radiograph from 04/02/2013  FINDINGS: Borderline cardiomegaly is stable as compared to prior study. Mediastinal silhouette within normal limits. Mild tortuosity of the intrathoracic aorta noted.  Lung volumes within normal limits. There is mild perihilar vascular congestion without overt pulmonary edema. No focal infiltrate or pleural effusion. No pneumothorax.  Visualized bowel gas pattern is within normal limits without evidence of obstruction or ileus. No abnormal bowel wall thickening. No free intraperitoneal air.  No soft tissue mass or abnormal calcification.  No acute osseus abnormality.  IMPRESSION: 1. Nonobstructive bowel gas pattern with no radiographic evidence acute intra-abdominal abnormality. 2. Borderline cardiomegaly with mild perihilar vascular congestion without overt pulmonary edema.   Electronically Signed   By: Benjamin  McClintock M.D.   On: 09/11/2013   01:11    Time spent: 35min  CHIU, STEPHEN K [facility] Pager [phone]  If 7PM-7AM, please contact night-coverage [email or web address] Password [email or web address] 09/11/2013, 2:41 PM 

## 2013-09-11 NOTE — ED Provider Notes (Signed)
CSN: [id number]     Arrival date & time 09/10/13  2322 History   First MD Initiated Contact with Patient 09/10/13 2323     Chief Complaint  Patient presents with  . Abdominal Pain     (Consider location/radiation/quality/duration/timing/severity/associated sxs/prior Treatment) HPI   Patient is a 68 yo woman who comes in from home via EMS with complaints of epigastric pain radiating into the chest. Her symptoms began abruptly about 3 hours prior to presentation. Her pain has been constant and severe. It is aching, sharp and burning. Nothing makes it worse or better. Pain began shortly after the patient ate dinner.  She has been nauseated but has not vomited. No diarrhea. She has general malaise and is noted to have a low-grade fever or in the emergency department. She has not appreciated a fever at home. The patient's only previous abdominal surgery is h/o hysterectomy.   Past Medical History  Diagnosis Date  . Allergy   . Depression   . Diabetes mellitus 1997    Type II   . GERD (gastroesophageal reflux disease)   . Hypertension   . Hyperlipidemia   . Osteoarthritis   . Osteoporosis   . Hair loss   . Anemia   . CAD (coronary artery disease)     Mild very minimal coronary disease with 20% obtuse marginal stenosis  . CVA (cerebral infarction)   . Diverticulosis   . Esophageal stricture   . Gastritis   . Adenomatous colon polyp   . Sickle cell trait   . Asthma        . COPD (chronic obstructive pulmonary disease)   . 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 Gastroparesis 08/21/2007  . RENAL INSUFFICIENCY 02/16/2009  . OSTEOARTHRITIS 08/09/2006  . Morbid obesity   . CHF (congestive heart failure)   . Carpal tunnel syndrome on left   . 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 Hernia, hiatal   . Seizures     pt thinks it has been several monthes since she had a seisure  . Stroke   . Hearing loss of both ears   . 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  Chronic kidney disease (CKD), stage III (moderate)    Past Surgical History  Procedure Laterality Date  . Abdominal hysterectomy    . Replacement total knee  1998    Left  . Esophagogastroduodenoscopy  06/25/2006  . Erd  08/08/2000  . Artery biopsy  01/07/2011    Procedure: MINOR BIOPSY TEMPORAL ARTERY;  Surgeon: Haywood Lasso, MD;  Location: [place];  Service: General;  Laterality: Left;  left temporal artery biopsy  . Breast lumpectomy      benign  . Breast lumpectomy      both breast lumps removed   . Hernia repair    . Colonoscopy    . Upper gastrointestinal endoscopy    . Bil foot surgery     Family History  Problem Relation Age of Onset  . Colon cancer Brother   . Cancer Brother     Colon Cancer  . Cancer Mother     Liver Cancer  . Diabetes Mother   . Kidney disease Mother   . Heart disease Mother     age 32's  . Heart disease Father 71    MI  . Heart attack Father     died of MI when pt was 67  . Sickle cell anemia Father   . Diabetes Sister   .  Kidney disease Sister   . Heart disease Sister     age 62's  . Allergies Sister   . Diabetes Sister   . Kidney disease Sister   . Heart disease Sister     age 38's  . Esophageal cancer Neg Hx   . Rectal cancer Neg Hx   . Stomach cancer Neg Hx    History  Substance Use Topics  . Smoking status: Former Smoker -- 0.50 packs/day for 10 years    Quit date: 04/18/1980  . Smokeless tobacco: Never Used  . Alcohol Use: No   OB History   Grav Para Term Preterm Abortions TAB SAB Ect Mult Living                 Review of Systems  10 point review of symptoms obtained and is negative with the exceptions of symptoms noted abov.e   Allergies  Promethazine hcl  Home Medications   Prior to Admission medications   Medication Sig Start Date End Date Taking? Authorizing Provider  albuterol (PROVENTIL) (2.5 MG/3ML) 0.083% nebulizer solution Take 2.5 mg by nebulization every 6 (six) hours as needed  for wheezing or shortness of breath.   Yes Historical Provider, MD  albuterol (VENTOLIN HFA) 108 (90 BASE) MCG/ACT inhaler Inhale 2 puffs into the lungs every 6 (six) hours as needed for shortness of breath. 07/07/12  Yes [name], MD  aspirin 325 MG tablet Take 325 mg by mouth daily.   Yes Historical Provider, MD  aspirin 81 MG tablet Take 81 mg by mouth daily.    Yes Historical Provider, MD  atorvastatin (LIPITOR) 40 MG tablet Take 1 tablet (40 mg total) by mouth daily. 09/07/10  Yes Minus Breeding, MD  budesonide-formoterol [facility]) 160-4.5 MCG/ACT inhaler Inhale 2 puffs into the lungs 2 (two) times daily. 07/08/13  Yes [name], MD  Calcium Carbonate-Vit D-Min (CALTRATE PLUS PO) Take 600 mg by mouth daily.     Yes Historical Provider, MD  cyclobenzaprine (FLEXERIL) 10 MG tablet Take 1 tablet (10 mg total) by mouth daily. 04/29/11  Yes Renato Shin, MD  diclofenac (VOLTAREN) 75 MG EC tablet Take 1 tablet by mouth Daily. 04/25/11  Yes Historical Provider, MD  diltiazem (CARDIZEM CD) 240 MG 24 hr capsule Take 240 mg by mouth daily.     Yes Historical Provider, MD  donepezil (ARICEPT) 10 MG tablet Take 10 mg by mouth at bedtime.   Yes Historical Provider, MD  ezetimibe (ZETIA) 10 MG tablet Take 10 mg by mouth daily.     Yes Historical Provider, MD  fluticasone (FLONASE) 50 MCG/ACT nasal spray Place 2 sprays into both nostrils daily.   Yes Historical Provider, MD  furosemide (LASIX) 80 MG tablet Take 80 mg by mouth 2 (two) times daily. 05/24/11  Yes Modena Jansky, MD  gabapentin (NEURONTIN) 300 MG capsule Take 1 capsule (300 mg total) by mouth 2 (two) times daily. 04/29/11  Yes Renato Shin, MD  hydrALAZINE (APRESOLINE) 100 MG tablet Take 100 mg by mouth 3 (three) times daily.   Yes Historical Provider, MD  insulin glargine (LANTUS SOLOSTAR) 100 UNIT/ML injection Inject 30 Units into the skin every morning. And pen needles 1/day   Yes Historical Provider, MD  insulin NPH (HUMULIN N) 100 UNIT/ML  injection Inject 2-13 Units into the skin 3 (three) times daily after meals. Sliding scale with each meal 07/07/12  Yes Renato Shin, MD  isosorbide mononitrate (IMDUR) 120 MG 24 hr tablet Take 1 tablet (120 mg  total) by mouth daily. 01/25/13  Yes Minus Breeding, MD  KLOR-CON M20 20 MEQ tablet Take 1 tablet by mouth Daily. 06/19/11  Yes Historical Provider, MD  LIDODERM 5 % Place 1 patch onto the skin daily. As needed for pain 10/20/11  Yes Historical Provider, MD  losartan (COZAAR) 100 MG tablet Take 100 mg by mouth daily.   Yes Historical Provider, MD  metoCLOPramide (REGLAN) 10 MG tablet Take 1 tablet (10 mg total) by mouth 4 (four) times daily. 07/07/12  Yes Inda Castle, MD  metolazone (ZAROXOLYN) 2.5 MG tablet Take 1 tablet by mouth Daily. 06/19/11  Yes Historical Provider, MD  mometasone-formoterol (DULERA) 100-5 MCG/ACT AERO Inhale 2 puffs into the lungs 2 (two) times daily.   Yes Historical Provider, MD  montelukast (SINGULAIR) 10 MG tablet Take 1 tablet (10 mg total) by mouth daily. 07/07/12  Yes [name], MD  Multiple Vitamin (MULTIVITAMIN PO) Take 1 tablet by mouth daily.     Yes Historical Provider, MD  nitroGLYCERIN (NITROLINGUAL) 0.4 MG/SPRAY spray Place 1 spray under the tongue every 5 (five) minutes x 3 doses as needed for chest pain.   Yes Historical Provider, MD  omeprazole (PRILOSEC) 20 MG capsule Take 20 mg by mouth daily. 08/31/12  Yes Inda Castle, MD  raloxifene (EVISTA) 60 MG tablet Take 60 mg by mouth daily.     Yes Historical Provider, MD  sucralfate (CARAFATE) 1 GM/10ML suspension Take 10 mLs (1 g total) by mouth 2 (two) times daily. 07/07/12  Yes Inda Castle, MD  TraMADol HCl 50 MG TBSO Take 1 tablet by mouth 4 (four) times daily as needed (for pain).    Yes Historical Provider, MD  citalopram (CELEXA) 20 MG tablet Take 20 mg by mouth daily.     Historical Provider, MD  fluticasone (FLONASE) 50 MCG/ACT nasal spray Place 2 sprays into the nose daily. 01/16/12 04/02/13   [name], MD   BP 152/75  Pulse 105  Temp(Src) 100.9 F (38.3 C) (Rectal)  Resp 26  Ht 5\' 4"  (1.626 m)  Wt 270 lb (122.471 kg)  BMI 46.32 kg/m2  SpO2 94% Physical Exam  Gen: well nourished and well developed appearing, appears uncomfortable Head: NCAT Ears: normal to inspection Nose: normal to inspection, no epistaxis or drainage Mouth: oral mucsoa is well hydrated appearing, normal posterior oropharynx Neck: supple, no stridor CV: RRR, no murmur, palpable peripheral pulses Resp: lung sounds are clear to auscultation bilaterally, no wheeing or rhonchi or rales, normal respiratory effort.  Abd: soft, obese, nondistended, RUQ abdominal pain with positive Murphy's sign. Skin: warm and dry Neuro: CN ii - XII, no focal deficitis Psyche; anxious affect, cooperative.   ED Course  Procedures (including critical care time) Labs Review  Results for orders placed during the hospital encounter of 09/10/13 (from the past 24 hour(s))  CBC WITH DIFFERENTIAL     Status: Abnormal   Collection Time    09/10/13 11:59 PM      Result Value Ref Range   WBC 13.4 (*) 4.0 - 10.5 K/uL   RBC 4.13  3.87 - 5.11 MIL/uL   Hemoglobin 9.6 (*) 12.0 - 15.0 g/dL   HCT 29.4 (*) 36.0 - 46.0 %   MCV 71.2 (*) 78.0 - 100.0 fL   MCH 23.2 (*) 26.0 - 34.0 pg   MCHC 32.7  30.0 - 36.0 g/dL   RDW 14.6  11.5 - 15.5 %   Platelets 313  150 - 400 K/uL  Neutrophils Relative % 90 (*) 43 - 77 %   Lymphocytes Relative 4 (*) 12 - 46 %   Monocytes Relative 6  3 - 12 %   Eosinophils Relative 0  0 - 5 %   Basophils Relative 0  0 - 1 %   Neutro Abs 12.1 (*) 1.7 - 7.7 K/uL   Lymphs Abs 0.5 (*) 0.7 - 4.0 K/uL   Monocytes Absolute 0.8  0.1 - 1.0 K/uL   Eosinophils Absolute 0.0  0.0 - 0.7 K/uL   Basophils Absolute 0.0  0.0 - 0.1 K/uL   RBC Morphology TARGET CELLS     WBC Morphology MILD LEFT SHIFT (1-5% METAS, OCC MYELO, OCC BANDS)    COMPREHENSIVE METABOLIC PANEL     Status: Abnormal   Collection Time    09/10/13  11:59 PM      Result Value Ref Range   Sodium 143  137 - 147 mEq/L   Potassium 4.2  3.7 - 5.3 mEq/L   Chloride 103  96 - 112 mEq/L   CO2 27  19 - 32 mEq/L   Glucose, Bld 277 (*) 70 - 99 mg/dL   BUN 27 (*) 6 - 23 mg/dL   Creatinine, Ser 1.61 (*) 0.50 - 1.10 mg/dL   Calcium 9.0  8.4 - 10.5 mg/dL   Total Protein 7.5  6.0 - 8.3 g/dL   Albumin 3.3 (*) 3.5 - 5.2 g/dL   AST 984 (*) 0 - 37 U/L   ALT 310 (*) 0 - 35 U/L   Alkaline Phosphatase 254 (*) 39 - 117 U/L   Total Bilirubin 0.9  0.3 - 1.2 mg/dL   GFR calc non Af Amer 32 (*) >90 mL/min   GFR calc Af Amer 37 (*) >90 mL/min   Anion gap 13  5 - 15  LIPASE, BLOOD     Status: None   Collection Time    09/10/13 11:59 PM      Result Value Ref Range   Lipase 34  11 - 59 U/L  I-STAT TROPOININ, ED     Status: None   Collection Time    09/11/13 12:29 AM      Result Value Ref Range   Troponin i, poc 0.00  0.00 - 0.08 ng/mL   Comment 3           I-STAT CG4 LACTIC ACID, ED     Status: None   Collection Time    09/11/13 12:32 AM      Result Value Ref Range   Lactic Acid, Venous 1.50  0.5 - 2.2 mmol/L    MDM   DDX: gastritis, PUD, GERD, pancreatitis, gallbladder disease, SBO, colitis, UTI, enteritis.   The patient's physical exam and lab findings point to acute cholecystitis as cause for symptoms. Ultrasound of the right upper quadrant has been ordered and results are pending. We're treating with IV fluids, analgesic and antiemetic.    Elyn Peers, MD 09/13/13 [phone]

## 2013-09-11 NOTE — Progress Notes (Signed)
Pt c/o sharp headache in base of head and neck.  MD notified and ordered to give PRN morphine.  New orders for ativan placed and ativan also given.  Portable cxr also ordered. [name]

## 2013-09-12 ENCOUNTER — Inpatient Hospital Stay (HOSPITAL_COMMUNITY): Payer: Medicare Other

## 2013-09-12 DIAGNOSIS — K831 Obstruction of bile duct: Secondary | ICD-10-CM | POA: Diagnosis not present

## 2013-09-12 DIAGNOSIS — M79609 Pain in unspecified limb: Secondary | ICD-10-CM

## 2013-09-12 LAB — IRON AND TIBC
Iron: 37 ug/dL — ABNORMAL LOW (ref 42–135)
Saturation Ratios: 19 % — ABNORMAL LOW (ref 20–55)
TIBC: 199 ug/dL — ABNORMAL LOW (ref 250–470)
UIBC: 162 ug/dL (ref 125–400)

## 2013-09-12 LAB — RETICULOCYTES
RBC.: 3.39 MIL/uL — ABNORMAL LOW (ref 3.87–5.11)
RETIC COUNT ABSOLUTE: 57.6 10*3/uL (ref 19.0–186.0)
Retic Ct Pct: 1.7 % (ref 0.4–3.1)

## 2013-09-12 LAB — CBC
HCT: 25.1 % — ABNORMAL LOW (ref 36.0–46.0)
Hemoglobin: 8 g/dL — ABNORMAL LOW (ref 12.0–15.0)
MCH: 23.4 pg — AB (ref 26.0–34.0)
MCHC: 31.9 g/dL (ref 30.0–36.0)
MCV: 73.4 fL — AB (ref 78.0–100.0)
Platelets: 234 10*3/uL (ref 150–400)
RBC: 3.42 MIL/uL — ABNORMAL LOW (ref 3.87–5.11)
RDW: 15.1 % (ref 11.5–15.5)
WBC: 10 10*3/uL (ref 4.0–10.5)

## 2013-09-12 LAB — FOLATE: Folate: >20 ng/mL

## 2013-09-12 LAB — GLUCOSE, CAPILLARY
Glucose-Capillary: 123 mg/dL — ABNORMAL HIGH (ref 70–99)
Glucose-Capillary: 86 mg/dL (ref 70–99)
Glucose-Capillary: 121 mg/dL — ABNORMAL HIGH (ref 70–99)

## 2013-09-12 LAB — COMPREHENSIVE METABOLIC PANEL
ALBUMIN: 2.8 g/dL — AB (ref 3.5–5.2)
ALT: 238 U/L — ABNORMAL HIGH (ref 0–35)
AST: 267 U/L — AB (ref 0–37)
Alkaline Phosphatase: 180 U/L — ABNORMAL HIGH (ref 39–117)
Anion gap: 12 (ref 5–15)
BUN: 33 mg/dL — ABNORMAL HIGH (ref 6–23)
CALCIUM: 8 mg/dL — AB (ref 8.4–10.5)
CO2: 25 meq/L (ref 19–32)
Chloride: 104 mEq/L (ref 96–112)
Creatinine, Ser: 3.53 mg/dL — ABNORMAL HIGH (ref 0.50–1.10)
GFR calc Af Amer: 14 mL/min — ABNORMAL LOW (ref >90)
GFR, EST NON AFRICAN AMERICAN: 12 mL/min — AB (ref >90)
Glucose, Bld: 133 mg/dL — ABNORMAL HIGH (ref 70–99)
Potassium: 5.8 mEq/L — ABNORMAL HIGH (ref 3.7–5.3)
Sodium: 141 mEq/L (ref 137–147)
Total Bilirubin: 0.4 mg/dL (ref 0.3–1.2)
Total Protein: 6.5 g/dL (ref 6.0–8.3)

## 2013-09-12 LAB — URINALYSIS, ROUTINE W REFLEX MICROSCOPIC
Bilirubin Urine: NEGATIVE
Glucose, UA: NEGATIVE mg/dL
Hgb urine dipstick: NEGATIVE
Ketones, ur: NEGATIVE mg/dL
NITRITE: NEGATIVE
PH: 5 (ref 5.0–8.0)
Protein, ur: NEGATIVE mg/dL
SPECIFIC GRAVITY, URINE: 1.018 (ref 1.005–1.030)
Urobilinogen, UA: 0.2 mg/dL (ref 0.0–1.0)

## 2013-09-12 LAB — URINE MICROSCOPIC-ADD ON

## 2013-09-12 LAB — VITAMIN B12: Vitamin B-12: 1370 pg/mL — ABNORMAL HIGH (ref 211–911)

## 2013-09-12 LAB — FERRITIN: FERRITIN: 189 ng/mL (ref 10–291)

## 2013-09-12 MED ORDER — SODIUM CHLORIDE 0.9 % IV SOLN
INTRAVENOUS | Status: DC
  Administered 2013-09-12 – 2013-09-13 (×2): via INTRAVENOUS
  Administered 2013-09-14: 150 mL/h via INTRAVENOUS

## 2013-09-12 MED ORDER — SODIUM CHLORIDE 0.9 % IV BOLUS (SEPSIS): 250.0000 mL | Freq: Four times a day (QID) | INTRAVENOUS | Status: DC | PRN

## 2013-09-12 MED ORDER — SODIUM CHLORIDE 0.9 % IV BOLUS (SEPSIS): 500.0000 mL | Freq: Once | INTRAVENOUS | Status: DC

## 2013-09-12 MED ORDER — SODIUM POLYSTYRENE SULFONATE 15 GM/60ML PO SUSP
30.0000 g | Freq: Once | ORAL | Status: AC
  Administered 2013-09-12: 30 g via ORAL
  Filled 2013-09-12: qty 120

## 2013-09-12 MED ORDER — SODIUM CHLORIDE 0.9 % IV BOLUS (SEPSIS): 500.0000 mL | Freq: Once | INTRAVENOUS | Status: AC

## 2013-09-12 MED ORDER — SODIUM CHLORIDE 0.9 % IV BOLUS (SEPSIS)
1000.0000 mL | Freq: Once | INTRAVENOUS | Status: AC
  Administered 2013-09-12: 1000 mL via INTRAVENOUS

## 2013-09-12 MED ORDER — ACETAMINOPHEN 325 MG PO TABS
650.0000 mg | ORAL_TABLET | Freq: Four times a day (QID) | ORAL | Status: DC | PRN
  Administered 2013-09-12 – 2013-09-15 (×2): 650 mg via ORAL
  Filled 2013-09-12 (×2): qty 2

## 2013-09-12 MED ORDER — TECHNETIUM TC 99M MEBROFENIN IV KIT
5.0000 | PACK | Freq: Once | INTRAVENOUS | Status: AC | PRN
  Administered 2013-09-12: 5 via INTRAVENOUS

## 2013-09-12 NOTE — Progress Notes (Addendum)
[facility] PROGRESS NOTE  NOOR WITTE OMA:[id number] DOB: Mar 02, 1945 DOA: 09/10/2013 PCP: Kandice Hams, MD  Assessment/Plan: Active Problems:   DIABETES MELLITUS, TYPE II   CKD (chronic kidney disease), stage III   Hypertension   Biliary colic     1. Acute on chronic CKD 3  1. Baseline Cr appears to be 1.2-1.5, significant increase in creatinine overnight 2. SBP <100 at this time, will therefore hold ARB and metolazone , Lasix 3. Agree with fluid bolus and continue aggressive IV hydration 4. Follow renal function closely  5. This is likely secondary to contrast-induced nephropathy as well as poor PO intake prior to admission 6. Monitor urine output closely 7. Kayexalate for hyperkalemia 2. HTN  1. Per above, sbp is <100 presently 2. Hold ARB and metolazone 3. Cont to monitor for now 3. DM 2  1. On SSI coverage alone currently 2. Pt is on 30 units Lantus at baseline 3. As pt is currently NPO, will resume on 1/2 dose to ensure continued basal coverage 4. CAD  1. Stable 2. Prior records reviewed 3. Pt has been follow up multiple times for chest pain as an outpatient, s/p cath in 02/2013 revealing only minimal disease 5. COPD  1. Stable 6. Documented hx of CHF  1. Uncertain of this diagnosis 2. Per above, recent cath in 2/15 demonstrated an EF of 70% on ventriculogram 3. Hold Lasix, should be able to tolerate boluses given diastolic heart failure 7. Biliary obstruction with transaminitis  1. Per primary surgical service 2. CT abd pending 3. LFT's appear to be trending down slowly overnight     Code Status: full Family Communication: family updated about patient's clinical progress Disposition Plan:  As above    Brief narrative: 68yo female who presented to the ED with complaints of RUQ pain and increased work of breathing. In the ED, pt was found to have markedly elevated LFT's including alk phos. An abd US revealed a normal appearing liver and CBG  but failed to identify a gallbladder. The patient was noted to also have mild leukocytosis of 13k and a low grade temp of 100.65F on admission. The patient was subsequently admitted to the General Surgery service for further work up of biliary colic with transaminitis. The hospitalist service was consulted for consulted for medical management given the complexity of the patient   Consultants:  Gen. surgery  Procedures:  None  Antibiotics:  None  HPI/Subjective: Still with intermittent RUQ abdominal pain  Having difficulty voiding   Objective: Filed Vitals:   09/11/13 2155 09/12/13 0242 09/12/13 0600 09/12/13 1020  BP: 94/36 125/43 114/53   Pulse: 84 74 77   Temp:  98.1 F (36.7 C) 98 F (36.7 C)   TempSrc:  Oral Oral   Resp:      Height:      Weight:   111.585 kg (246 lb)   SpO2: 100% 100% 98% 98%    Intake/Output Summary (Last 24 hours) at 09/12/13 1409 Last data filed at 09/12/13 0300  Gross per 24 hour  Intake 1432.92 ml  Output      0 ml  Net 1432.92 ml    Exam:  General: alert & oriented x 3 In NAD  Cardiovascular: RRR, nl S1 s2  Respiratory: Decreased breath sounds at the bases, scattered rhonchi, no crackles  Abdomen: soft +BS NT/ND, no masses palpable  Extremities: No cyanosis and no edema      Data Reviewed: Basic Metabolic Panel:  Recent Labs Lab  09/10/13 2359 09/11/13 0900 09/12/13 0505  NA 143 145 141  K 4.2 4.5 5.8*  CL 103 105 104  CO2 _0 GLUCOSE 277* 200* 133*  BUN 27* 28* 33*  CREATININE 1.61* 1.64* 3.53*  CALCIUM 9.0 8.7 8.0*    Liver Function Tests:  Recent Labs Lab 09/10/13 2359 09/11/13 0900 09/12/13 0505  AST 984* 880* 267*  ALT 310* 392* 238*  ALKPHOS 254* 246* 180*  BILITOT 0.9 0.9 0.4  PROT 7.5 7.0 6.5  ALBUMIN 3.3* 3.0* 2.8*    Recent Labs Lab 09/10/13 2359  LIPASE 34   No results found for this basename: AMMONIA,  in the last 168 hours  CBC:  Recent Labs Lab 09/10/13 2359 09/11/13 0900  09/12/13 0505  WBC 13.4* 11.8* 10.0  NEUTROABS 12.1* 9.3*  --   HGB 9.6* 8.9* 8.0*  HCT 29.4* 27.9* 25.1*  MCV 71.2* 73.0* 73.4*  PLT 313 269 234    Cardiac Enzymes: No results found for this basename: CKTOTAL, CKMB, CKMBINDEX, TROPONINI,  in the last 168 hours BNP (last 3 results)  Recent Labs  12/15/12 1114  PROBNP 24.0     CBG:  Recent Labs Lab 09/11/13 1231 09/11/13 1501 09/11/13 1733 09/11/13 2204 09/12/13 0729  GLUCAP 96 130* 141* 206* 123*    No results found for this or any previous visit (from the past 240 hour(s)).   Studies: Dg Chest 1 View  09/11/2013   CLINICAL DATA:  Difficulty breathing and chest pain  EXAM: CHEST - 1 VIEW  COMPARISON:  September 11, 2013  FINDINGS: Degree of inspiration is shallow. There is no edema or consolidation. Heart is upper normal in size with pulmonary vascularity within normal limits. No pneumothorax. No adenopathy. No bone lesions.  IMPRESSION: Shallow degree of inspiration.  No edema or consolidation.   Electronically Signed   By: Lowella Grip M.D.   On: 09/11/2013 19:20   Nm Hepatobiliary  09/12/2013   CLINICAL DATA:  Abdominal pain and possible cholecystitis  EXAM: NUCLEAR MEDICINE HEPATOBILIARY IMAGING  TECHNIQUE: Sequential images of the abdomen were obtained out to 60 minutes following intravenous administration of radiopharmaceutical.  RADIOPHARMACEUTICALS:  5 [place] LD-35T Choletec  COMPARISON:  Abdominal ultrasound of September 11, 2013.  FINDINGS: There is adequate radiopharmaceutical uptake by the liver. The common bile duct is visible by 25 min as is the gallbladder. Bowel activity becomes visible by approximately 90 min.  IMPRESSION: Normal hepatobiliary scan.  Abdominal pain, possible Select proper NMHepatobiliary template.   Electronically Signed   By: David  Martinique   On: 09/12/2013 12:49   Ct Abdomen Pelvis W Contrast  09/11/2013   CLINICAL DATA:  Abdominal pain, elevated LFTs, mildly compromised renal function   EXAM: CT ABDOMEN AND PELVIS WITH CONTRAST  TECHNIQUE: Multidetector CT imaging of the abdomen and pelvis was performed using the standard protocol following bolus administration of intravenous contrast.  CONTRAST:  74m OMNIPAQUE IOHEXOL 300 MG/ML  SOLN  COMPARISON:  Abdominal ultrasound of today's date  FINDINGS: The stomach is moderately distended with the oral contrast as well as with gas. The liver exhibits no focal mass or ductal dilation. The gallbladder is adequately distended now with no evidence of stones. The pancreas exhibits no focal mass or definite inflammatory change. The spleen, adrenal glands, and kidneys exhibit no acute abnormalities. There is stable hypodensity in the lower pole of the left kidney which exhibits HU value of 17 most compatible with a cyst. A 1 cm hypodensity in  the midpole of the right kidney exhibits HU value of 1.  The contrast has traversed much of the small bowel but has not yet reached the colon. There is no evidence of ileus or obstruction or acute inflammation. There is no inguinal hernia. There is a umbilical hernia containing fat.  The caliber of the abdominal aorta is normal. The small and large bowel exhibit no evidence of ileus, obstruction, or acute inflammation. There is no ascites. The urinary bladder, prostate gland, and seminal vesicles are normal. The lumbar spine and bony pelvis exhibit no acute abnormalities. There are degenerative disc changes at L5-S1. The lung bases exhibit increased densities posteriorly, bilaterally consistent with atelectasis or pneumonia.  IMPRESSION: 1. Bibasilar atelectasis or pneumonia. 2. The gallbladder is visualized and appears normal. There is no acute hepatobiliary abnormality. 3. There is no acute urinary tract or bowel abnormality.   Electronically Signed   By: David  Martinique   On: 09/11/2013 19:23   US Abdomen Limited  09/11/2013   CLINICAL DATA:  68 year old female with abdominal pain, right upper quadrant pain, abnormal  LFTs. Initial encounter.  EXAM: US ABDOMEN LIMITED - RIGHT UPPER QUADRANT  COMPARISON:  CT Abdomen and Pelvis 01/30/2009.  FINDINGS: Gallbladder:  Not visualized, presumably contracted.  Common bile duct:  Diameter: 6 mm, normal.  Liver:  No focal lesion identified. Within normal limits in parenchymal echogenicity.  IMPRESSION: 1. Gallbladder not identified, presumably contracted. 2. Negative sonographic appearance of the liver and CBD.   Electronically Signed   By: Lars Pinks M.D.   On: 09/11/2013 02:18   Dg Abd Acute W/chest  09/11/2013   CLINICAL DATA:  Mid chest and lower abdominal pain  EXAM: ACUTE ABDOMEN SERIES (ABDOMEN 2 VIEW & CHEST 1 VIEW)  COMPARISON:  Prior radiograph from 04/02/2013  FINDINGS: Borderline cardiomegaly is stable as compared to prior study. Mediastinal silhouette within normal limits. Mild tortuosity of the intrathoracic aorta noted.  Lung volumes within normal limits. There is mild perihilar vascular congestion without overt pulmonary edema. No focal infiltrate or pleural effusion. No pneumothorax.  Visualized bowel gas pattern is within normal limits without evidence of obstruction or ileus. No abnormal bowel wall thickening. No free intraperitoneal air.  No soft tissue mass or abnormal calcification.  No acute osseus abnormality.  IMPRESSION: 1. Nonobstructive bowel gas pattern with no radiographic evidence acute intra-abdominal abnormality. 2. Borderline cardiomegaly with mild perihilar vascular congestion without overt pulmonary edema.   Electronically Signed   By: Jeannine Boga M.D.   On: 09/11/2013 01:11    Scheduled Meds: . atorvastatin  40 mg Oral Daily  . budesonide-formoterol  2 puff Inhalation BID  . citalopram  20 mg Oral Daily  . diltiazem  240 mg Oral Daily  . donepezil  10 mg Oral QHS  . ezetimibe  10 mg Oral Daily  . fluticasone  2 spray Each Nare Daily  . gabapentin  300 mg Oral BID  . hydrALAZINE  100 mg Oral 3 times per day  . insulin aspart  0-15  Units Subcutaneous TID WC  . insulin glargine  15 Units Subcutaneous Daily  . isosorbide mononitrate  120 mg Oral Daily  . metolazone  2.5 mg Oral Daily  . mometasone-formoterol  2 puff Inhalation BID  . montelukast  10 mg Oral Daily  . pantoprazole (PROTONIX) IV  40 mg Intravenous QHS  . raloxifene  60 mg Oral Daily   Continuous Infusions: . sodium chloride 150 mL/hr at 09/12/13 1030  Active Problems:   DIABETES MELLITUS, TYPE II   CKD (chronic kidney disease), stage III   Hypertension   Biliary colic    Time spent: 40 minutes   [facility] Pager [phone]. If 7PM-7AM, please contact night-coverage at [email or web address], password [facility] 09/12/2013, 2:09 PM  LOS: 2 days

## 2013-09-12 NOTE — Progress Notes (Signed)
Subjective: Still with intermittent RUQ abdominal pain Having difficulty voiding  Objective: Vital signs in last 24 hours: Temp:  [97.4 F (36.3 C)-98.1 F (36.7 C)] 98 F (36.7 C) (08/23 0600) Pulse Rate:  [71-84] 77 (08/23 0600) Resp:  [16-18] 16 (08/22 2152) BP: (94-125)/(36-59) 114/53 mmHg (08/23 0600) SpO2:  [98 %-100 %] 98 % (08/23 0600) Weight:  [246 lb (111.585 kg)] 246 lb (111.585 kg) (08/23 0600) Last BM Date: 09/10/13  Intake/Output from previous day: 08/22 0701 - 08/23 0700 In: 1432.9 [I.V.:1432.9] Out: -  Intake/Output this shift:    Abdomen soft, obese, RUQ tenderness with guarding  Lab Results:   Recent Labs  09/11/13 0900 09/12/13 0505  WBC 11.8* 10.0  HGB 8.9* 8.0*  HCT 27.9* 25.1*  PLT 269 234   BMET  Recent Labs  09/11/13 0900 09/12/13 0505  NA 145 141  K 4.5 5.8*  CL 105 104  CO2 27 25  GLUCOSE 200* 133*  BUN 28* 33*  CREATININE 1.64* 3.53*  CALCIUM 8.7 8.0*   PT/INR No results found for this basename: LABPROT, INR,  in the last 72 hours ABG No results found for this basename: PHART, PCO2, PO2, HCO3,  in the last 72 hours  Studies/Results: Dg Chest 1 View  09/11/2013   CLINICAL DATA:  Difficulty breathing and chest pain  EXAM: CHEST - 1 VIEW  COMPARISON:  September 11, 2013  FINDINGS: Degree of inspiration is shallow. There is no edema or consolidation. Heart is upper normal in size with pulmonary vascularity within normal limits. No pneumothorax. No adenopathy. No bone lesions.  IMPRESSION: Shallow degree of inspiration.  No edema or consolidation.   Electronically Signed   By: Lowella Grip M.D.   On: 09/11/2013 19:20   Ct Abdomen Pelvis W Contrast  09/11/2013   CLINICAL DATA:  Abdominal pain, elevated LFTs, mildly compromised renal function  EXAM: CT ABDOMEN AND PELVIS WITH CONTRAST  TECHNIQUE: Multidetector CT imaging of the abdomen and pelvis was performed using the standard protocol following bolus administration of  intravenous contrast.  CONTRAST:  74mL OMNIPAQUE IOHEXOL 300 MG/ML  SOLN  COMPARISON:  Abdominal ultrasound of today's date  FINDINGS: The stomach is moderately distended with the oral contrast as well as with gas. The liver exhibits no focal mass or ductal dilation. The gallbladder is adequately distended now with no evidence of stones. The pancreas exhibits no focal mass or definite inflammatory change. The spleen, adrenal glands, and kidneys exhibit no acute abnormalities. There is stable hypodensity in the lower pole of the left kidney which exhibits HU value of 17 most compatible with a cyst. A 1 cm hypodensity in the midpole of the right kidney exhibits HU value of 1.  The contrast has traversed much of the small bowel but has not yet reached the colon. There is no evidence of ileus or obstruction or acute inflammation. There is no inguinal hernia. There is a umbilical hernia containing fat.  The caliber of the abdominal aorta is normal. The small and large bowel exhibit no evidence of ileus, obstruction, or acute inflammation. There is no ascites. The urinary bladder, prostate gland, and seminal vesicles are normal. The lumbar spine and bony pelvis exhibit no acute abnormalities. There are degenerative disc changes at L5-S1. The lung bases exhibit increased densities posteriorly, bilaterally consistent with atelectasis or pneumonia.  IMPRESSION: 1. Bibasilar atelectasis or pneumonia. 2. The gallbladder is visualized and appears normal. There is no acute hepatobiliary abnormality. 3. There is no acute urinary  tract or bowel abnormality.   Electronically Signed   By: David  Martinique   On: 09/11/2013 19:23   US Abdomen Limited  09/11/2013   CLINICAL DATA:  68 year old female with abdominal pain, right upper quadrant pain, abnormal LFTs. Initial encounter.  EXAM: US ABDOMEN LIMITED - RIGHT UPPER QUADRANT  COMPARISON:  CT Abdomen and Pelvis 01/30/2009.  FINDINGS: Gallbladder:  Not visualized, presumably  contracted.  Common bile duct:  Diameter: 6 mm, normal.  Liver:  No focal lesion identified. Within normal limits in parenchymal echogenicity.  IMPRESSION: 1. Gallbladder not identified, presumably contracted. 2. Negative sonographic appearance of the liver and CBD.   Electronically Signed   By: Lars Pinks M.D.   On: 09/11/2013 02:18   Dg Abd Acute W/chest  09/11/2013   CLINICAL DATA:  Mid chest and lower abdominal pain  EXAM: ACUTE ABDOMEN SERIES (ABDOMEN 2 VIEW & CHEST 1 VIEW)  COMPARISON:  Prior radiograph from 04/02/2013  FINDINGS: Borderline cardiomegaly is stable as compared to prior study. Mediastinal silhouette within normal limits. Mild tortuosity of the intrathoracic aorta noted.  Lung volumes within normal limits. There is mild perihilar vascular congestion without overt pulmonary edema. No focal infiltrate or pleural effusion. No pneumothorax.  Visualized bowel gas pattern is within normal limits without evidence of obstruction or ileus. No abnormal bowel wall thickening. No free intraperitoneal air.  No soft tissue mass or abnormal calcification.  No acute osseus abnormality.  IMPRESSION: 1. Nonobstructive bowel gas pattern with no radiographic evidence acute intra-abdominal abnormality. 2. Borderline cardiomegaly with mild perihilar vascular congestion without overt pulmonary edema.   Electronically Signed   By: Jeannine Boga M.D.   On: 09/11/2013 01:11    Anti-infectives: Anti-infectives   None      Assessment/Plan:  RUQ abdominal pain, elevated LFT's of uncertain etiology.  Ultrasound and CT are negative for stones.  She is now having urinary retention and increased Cr.  She also has anemia.   Will place foley and bolus IVF Check HIDA scan May need GI and Nephrology consults.  LOS: 2 days    Joann Gutierrez A 09/12/2013

## 2013-09-12 NOTE — Progress Notes (Signed)
VASCULAR LAB PRELIMINARY  PRELIMINARY  PRELIMINARY  PRELIMINARY  Bilateral lower extremity venous duplex  completed.    Preliminary report:  Bilateral:  No evidence of DVT, superficial thrombosis, or Baker's Cyst.  Technically limited by pain, patient guarding.     Daisey Caloca, RVT 09/12/2013, 2:22 PM

## 2013-09-13 ENCOUNTER — Inpatient Hospital Stay (HOSPITAL_COMMUNITY): Payer: Medicare Other

## 2013-09-13 DIAGNOSIS — R1011 Right upper quadrant pain: Secondary | ICD-10-CM

## 2013-09-13 DIAGNOSIS — R109 Unspecified abdominal pain: Secondary | ICD-10-CM

## 2013-09-13 DIAGNOSIS — R112 Nausea with vomiting, unspecified: Secondary | ICD-10-CM

## 2013-09-13 DIAGNOSIS — R748 Abnormal levels of other serum enzymes: Secondary | ICD-10-CM

## 2013-09-13 LAB — GLUCOSE, CAPILLARY
GLUCOSE-CAPILLARY: 118 mg/dL — AB (ref 70–99)
Glucose-Capillary: 151 mg/dL — ABNORMAL HIGH (ref 70–99)
Glucose-Capillary: 150 mg/dL — ABNORMAL HIGH (ref 70–99)
Glucose-Capillary: 150 mg/dL — ABNORMAL HIGH (ref 70–99)

## 2013-09-13 LAB — CBC
HCT: 26.9 % — ABNORMAL LOW (ref 36.0–46.0)
Hemoglobin: 8.3 g/dL — ABNORMAL LOW (ref 12.0–15.0)
MCH: 23.2 pg — ABNORMAL LOW (ref 26.0–34.0)
MCHC: 30.9 g/dL (ref 30.0–36.0)
MCV: 75.4 fL — AB (ref 78.0–100.0)
Platelets: 260 10*3/uL (ref 150–400)
RBC: 3.57 MIL/uL — ABNORMAL LOW (ref 3.87–5.11)
RDW: 15.5 % (ref 11.5–15.5)
WBC: 7.8 10*3/uL (ref 4.0–10.5)

## 2013-09-13 LAB — COMPREHENSIVE METABOLIC PANEL
ALK PHOS: 161 U/L — AB (ref 39–117)
ALT: 160 U/L — AB (ref 0–35)
AST: 102 U/L — AB (ref 0–37)
Albumin: 2.7 g/dL — ABNORMAL LOW (ref 3.5–5.2)
Anion gap: 13 (ref 5–15)
BUN: 32 mg/dL — ABNORMAL HIGH (ref 6–23)
CALCIUM: 7.9 mg/dL — AB (ref 8.4–10.5)
CO2: 23 meq/L (ref 19–32)
Chloride: 105 mEq/L (ref 96–112)
Creatinine, Ser: 2.96 mg/dL — ABNORMAL HIGH (ref 0.50–1.10)
GFR calc Af Amer: 18 mL/min — ABNORMAL LOW (ref >90)
GFR, EST NON AFRICAN AMERICAN: 15 mL/min — AB (ref >90)
Glucose, Bld: 124 mg/dL — ABNORMAL HIGH (ref 70–99)
POTASSIUM: 4.3 meq/L (ref 3.7–5.3)
SODIUM: 141 meq/L (ref 137–147)
TOTAL PROTEIN: 6.5 g/dL (ref 6.0–8.3)
Total Bilirubin: 0.4 mg/dL (ref 0.3–1.2)

## 2013-09-13 LAB — HEPATITIS PANEL, ACUTE
HCV AB: NEGATIVE
HEP B S AG: NEGATIVE
Hep A IgM: NONREACTIVE
Hep B C IgM: NONREACTIVE

## 2013-09-13 LAB — URINE MICROSCOPIC-ADD ON

## 2013-09-13 LAB — URINALYSIS, ROUTINE W REFLEX MICROSCOPIC
Glucose, UA: NEGATIVE mg/dL
HGB URINE DIPSTICK: NEGATIVE
Ketones, ur: NEGATIVE mg/dL
Nitrite: NEGATIVE
PROTEIN: 30 mg/dL — AB
SPECIFIC GRAVITY, URINE: 1.023 (ref 1.005–1.030)
Urobilinogen, UA: 0.2 mg/dL (ref 0.0–1.0)
pH: 5 (ref 5.0–8.0)

## 2013-09-13 LAB — PROTIME-INR
INR: 1.04 (ref 0.00–1.49)
Prothrombin Time: 13.6 seconds (ref 11.6–15.2)

## 2013-09-13 MED ORDER — HEPARIN SODIUM (PORCINE) 5000 UNIT/ML IJ SOLN
5000.0000 [IU] | Freq: Three times a day (TID) | INTRAMUSCULAR | Status: DC
  Administered 2013-09-13 – 2013-09-18 (×11): 5000 [IU] via SUBCUTANEOUS
  Filled 2013-09-13 (×22): qty 1

## 2013-09-13 NOTE — Clinical Documentation Improvement (Signed)
"  CHF" and "Diastolic Heart Failure" documented this admission.  Please document the ACUITY of the Diastolic Heart Failure monitored this admission.  Acuity:  - Acute  - Chronic  - Acute on Chronic  Thank You, Erling Conte ,RN Clinical Documentation Specialist:  [phone] [facility]

## 2013-09-13 NOTE — Progress Notes (Signed)
Patient ID: Joann Gutierrez, female   DOB: 07-12-1945, 68 y.o.   MRN: [id number]     [address] [place], [place] [address]    Phone: [phone] FAX: [phone]     Subjective: Pt vomiting.  Persistent abdominal pain.  Afebrile.  Normal white count.  Normal bilirubin.  LFTs are trending down.  sCr 3.53--->2.96 today.   Objective:  Vital signs:  Filed Vitals:   09/12/13 2121 09/13/13 0235 09/13/13 0522 09/13/13 0753  BP:  140/58 154/62   Pulse:  91    Temp:  98.1 F (36.7 C) 98.3 F (36.8 C)   TempSrc:  Oral Oral   Resp:  16 16   Height:      Weight:   284 lb 9.6 oz (129.094 kg)   SpO2: 99% 97% 95% 97%    Last BM Date: 09/12/13  Intake/Output   Yesterday:  08/23 0701 - 08/24 0700 In: 9833 [I.V.:2011; IV Piggyback:2600] Out: 8250 [Urine:1475] This shift: I/O last 3 completed shifts: In: 6043.9 [I.V.:3443.9; IV Piggyback:2600] Out: 5397 [Urine:1475]    Physical Exam: General: Pt awake/alert/oriented x4 in mild acute distress Chest: cta. No chest wall pain w good excursion CV:  Pulses intact.  Regular rhythm Abdomen: Soft.  Nondistended.  Diffusely tender, no guarding.  No evidence of peritonitis.  No incarcerated hernias.   Problem List:   Active Problems:   DIABETES MELLITUS, TYPE II   CKD (chronic kidney disease), stage III   Hypertension   Biliary colic    Results:   Labs: Results for orders placed during the hospital encounter of 09/10/13 (from the past 48 hour(s))  GLUCOSE, CAPILLARY     Status: Abnormal   Collection Time    09/11/13  8:16 AM      Result Value Ref Range   Glucose-Capillary 215 (*) 70 - 99 mg/dL  COMPREHENSIVE METABOLIC PANEL     Status: Abnormal   Collection Time    09/11/13  9:00 AM      Result Value Ref Range   Sodium 145  137 - 147 mEq/L   Potassium 4.5  3.7 - 5.3 mEq/L   Chloride 105  96 - 112 mEq/L   CO2 27  19 - 32 mEq/L   Glucose, Bld 200 (*)  70 - 99 mg/dL   BUN 28 (*) 6 - 23 mg/dL   Creatinine, Ser 1.64 (*) 0.50 - 1.10 mg/dL   Calcium 8.7  8.4 - 10.5 mg/dL   Total Protein 7.0  6.0 - 8.3 g/dL   Albumin 3.0 (*) 3.5 - 5.2 g/dL   AST 880 (*) 0 - 37 U/L   ALT 392 (*) 0 - 35 U/L   Alkaline Phosphatase 246 (*) 39 - 117 U/L   Total Bilirubin 0.9  0.3 - 1.2 mg/dL   GFR calc non Af Amer 31 (*) >90 mL/min   GFR calc Af Amer 36 (*) >90 mL/min   Comment: (NOTE)     The eGFR has been calculated using the CKD EPI equation.     This calculation has not been validated in all clinical situations.     eGFR's persistently <90 mL/min signify possible Chronic Kidney     Disease.   Anion gap 13  5 - 15  CBC WITH DIFFERENTIAL     Status: Abnormal   Collection Time    09/11/13  9:00 AM      Result Value  Ref Range   WBC 11.8 (*) 4.0 - 10.5 K/uL   RBC 3.82 (*) 3.87 - 5.11 MIL/uL   Hemoglobin 8.9 (*) 12.0 - 15.0 g/dL   HCT 27.9 (*) 36.0 - 46.0 %   MCV 73.0 (*) 78.0 - 100.0 fL   MCH 23.3 (*) 26.0 - 34.0 pg   MCHC 31.9  30.0 - 36.0 g/dL   RDW 14.9  11.5 - 15.5 %   Platelets 269  150 - 400 K/uL   Neutrophils Relative % 79 (*) 43 - 77 %   Neutro Abs 9.3 (*) 1.7 - 7.7 K/uL   Lymphocytes Relative 15  12 - 46 %   Lymphs Abs 1.8  0.7 - 4.0 K/uL   Monocytes Relative 5  3 - 12 %   Monocytes Absolute 0.6  0.1 - 1.0 K/uL   Eosinophils Relative 0  0 - 5 %   Eosinophils Absolute 0.0  0.0 - 0.7 K/uL   Basophils Relative 0  0 - 1 %   Basophils Absolute 0.0  0.0 - 0.1 K/uL  GLUCOSE, CAPILLARY     Status: None   Collection Time    09/11/13 12:31 PM      Result Value Ref Range   Glucose-Capillary 96  70 - 99 mg/dL  GLUCOSE, CAPILLARY     Status: Abnormal   Collection Time    09/11/13  3:01 PM      Result Value Ref Range   Glucose-Capillary 130 (*) 70 - 99 mg/dL  GLUCOSE, CAPILLARY     Status: Abnormal   Collection Time    09/11/13  5:33 PM      Result Value Ref Range   Glucose-Capillary 141 (*) 70 - 99 mg/dL  GLUCOSE, CAPILLARY     Status:  Abnormal   Collection Time    09/11/13 10:04 PM      Result Value Ref Range   Glucose-Capillary 206 (*) 70 - 99 mg/dL  CBC     Status: Abnormal   Collection Time    09/12/13  5:05 AM      Result Value Ref Range   WBC 10.0  4.0 - 10.5 K/uL   RBC 3.42 (*) 3.87 - 5.11 MIL/uL   Hemoglobin 8.0 (*) 12.0 - 15.0 g/dL   HCT 25.1 (*) 36.0 - 46.0 %   MCV 73.4 (*) 78.0 - 100.0 fL   MCH 23.4 (*) 26.0 - 34.0 pg   MCHC 31.9  30.0 - 36.0 g/dL   RDW 15.1  11.5 - 15.5 %   Platelets 234  150 - 400 K/uL  COMPREHENSIVE METABOLIC PANEL     Status: Abnormal   Collection Time    09/12/13  5:05 AM      Result Value Ref Range   Sodium 141  137 - 147 mEq/L   Potassium 5.8 (*) 3.7 - 5.3 mEq/L   Comment: DELTA CHECK NOTED   Chloride 104  96 - 112 mEq/L   CO2 25  19 - 32 mEq/L   Glucose, Bld 133 (*) 70 - 99 mg/dL   BUN 33 (*) 6 - 23 mg/dL   Creatinine, Ser 3.53 (*) 0.50 - 1.10 mg/dL   Comment: DELTA CHECK NOTED   Calcium 8.0 (*) 8.4 - 10.5 mg/dL   Total Protein 6.5  6.0 - 8.3 g/dL   Albumin 2.8 (*) 3.5 - 5.2 g/dL   AST 267 (*) 0 - 37 U/L   ALT 238 (*) 0 - 35 U/L   Alkaline  Phosphatase 180 (*) 39 - 117 U/L   Total Bilirubin 0.4  0.3 - 1.2 mg/dL   GFR calc non Af Amer 12 (*) >90 mL/min   GFR calc Af Amer 14 (*) >90 mL/min   Comment: (NOTE)     The eGFR has been calculated using the CKD EPI equation.     This calculation has not been validated in all clinical situations.     eGFR's persistently <90 mL/min signify possible Chronic Kidney     Disease.   Anion gap 12  5 - 15  GLUCOSE, CAPILLARY     Status: Abnormal   Collection Time    09/12/13  7:29 AM      Result Value Ref Range   Glucose-Capillary 123 (*) 70 - 99 mg/dL  VITAMIN B12     Status: Abnormal   Collection Time    09/12/13  3:30 PM      Result Value Ref Range   Vitamin B-12 1370 (*) 211 - 911 pg/mL   Comment: Performed at [facility]     Status: None   Collection Time    09/12/13  3:30 PM      Result Value Ref  Range   Folate >20.0     Comment: (NOTE)     Reference Ranges            Deficient:       0.4 - 3.3 ng/mL            Indeterminate:   3.4 - 5.4 ng/mL            Normal:              > 5.4 ng/mL     Performed at [facility] TIBC     Status: Abnormal   Collection Time    09/12/13  3:30 PM      Result Value Ref Range   Iron 37 (*) 42 - 135 ug/dL   TIBC 199 (*) 250 - 470 ug/dL   Saturation Ratios 19 (*) 20 - 55 %   UIBC 162  125 - 400 ug/dL   Comment: Performed at [facility]     Status: None   Collection Time    09/12/13  3:30 PM      Result Value Ref Range   Ferritin 189  10 - 291 ng/mL   Comment: Performed at [facility]     Status: Abnormal   Collection Time    09/12/13  3:30 PM      Result Value Ref Range   Retic Ct Pct 1.7  0.4 - 3.1 %   RBC. 3.39 (*) 3.87 - 5.11 MIL/uL   Retic Count, Manual 57.6  19.0 - 186.0 K/uL  GLUCOSE, CAPILLARY     Status: None   Collection Time    09/12/13  4:40 PM      Result Value Ref Range   Glucose-Capillary 86  70 - 99 mg/dL  GLUCOSE, CAPILLARY     Status: Abnormal   Collection Time    09/12/13  9:10 PM      Result Value Ref Range   Glucose-Capillary 121 (*) 70 - 99 mg/dL   Comment 1 Notify RN    URINALYSIS, ROUTINE W REFLEX MICROSCOPIC     Status: Abnormal   Collection Time    09/12/13  9:43 PM      Result Value Ref Range  Color, Urine YELLOW  YELLOW   APPearance CLOUDY (*) CLEAR   Specific Gravity, Urine 1.018  1.005 - 1.030   pH 5.0  5.0 - 8.0   Glucose, UA NEGATIVE  NEGATIVE mg/dL   Hgb urine dipstick NEGATIVE  NEGATIVE   Bilirubin Urine NEGATIVE  NEGATIVE   Ketones, ur NEGATIVE  NEGATIVE mg/dL   Protein, ur NEGATIVE  NEGATIVE mg/dL   Urobilinogen, UA 0.2  0.0 - 1.0 mg/dL   Nitrite NEGATIVE  NEGATIVE   Leukocytes, UA MODERATE (*) NEGATIVE  URINE MICROSCOPIC-ADD ON     Status: Abnormal   Collection Time    09/12/13  9:43 PM      Result Value Ref Range    Squamous Epithelial / LPF RARE  RARE   WBC, UA 7-10  <3 WBC/hpf   RBC / HPF 3-6  <3 RBC/hpf   Bacteria, UA RARE  RARE   Casts HYALINE CASTS (*) NEGATIVE  CBC     Status: Abnormal   Collection Time    09/13/13  5:34 AM      Result Value Ref Range   WBC 7.8  4.0 - 10.5 K/uL   RBC 3.57 (*) 3.87 - 5.11 MIL/uL   Hemoglobin 8.3 (*) 12.0 - 15.0 g/dL   HCT 26.9 (*) 36.0 - 46.0 %   MCV 75.4 (*) 78.0 - 100.0 fL   MCH 23.2 (*) 26.0 - 34.0 pg   MCHC 30.9  30.0 - 36.0 g/dL   RDW 15.5  11.5 - 15.5 %   Platelets 260  150 - 400 K/uL  COMPREHENSIVE METABOLIC PANEL     Status: Abnormal   Collection Time    09/13/13  5:34 AM      Result Value Ref Range   Sodium 141  137 - 147 mEq/L   Potassium 4.3  3.7 - 5.3 mEq/L   Comment: DELTA CHECK NOTED   Chloride 105  96 - 112 mEq/L   CO2 23  19 - 32 mEq/L   Glucose, Bld 124 (*) 70 - 99 mg/dL   BUN 32 (*) 6 - 23 mg/dL   Creatinine, Ser 2.96 (*) 0.50 - 1.10 mg/dL   Calcium 7.9 (*) 8.4 - 10.5 mg/dL   Total Protein 6.5  6.0 - 8.3 g/dL   Albumin 2.7 (*) 3.5 - 5.2 g/dL   AST 102 (*) 0 - 37 U/L   ALT 160 (*) 0 - 35 U/L   Alkaline Phosphatase 161 (*) 39 - 117 U/L   Total Bilirubin 0.4  0.3 - 1.2 mg/dL   GFR calc non Af Amer 15 (*) >90 mL/min   GFR calc Af Amer 18 (*) >90 mL/min   Comment: (NOTE)     The eGFR has been calculated using the CKD EPI equation.     This calculation has not been validated in all clinical situations.     eGFR's persistently <90 mL/min signify possible Chronic Kidney     Disease.   Anion gap 13  5 - 15    Imaging / Studies: Dg Chest 1 View  09/11/2013   CLINICAL DATA:  Difficulty breathing and chest pain  EXAM: CHEST - 1 VIEW  COMPARISON:  September 11, 2013  FINDINGS: Degree of inspiration is shallow. There is no edema or consolidation. Heart is upper normal in size with pulmonary vascularity within normal limits. No pneumothorax. No adenopathy. No bone lesions.  IMPRESSION: Shallow degree of inspiration.  No edema or  consolidation.   Electronically Signed  By: Lowella Grip M.D.   On: 09/11/2013 19:20   Ct Head Wo Contrast  09/12/2013   CLINICAL DATA:  Headache, COPD  EXAM: CT HEAD WITHOUT CONTRAST  TECHNIQUE: Contiguous axial images were obtained from the base of the skull through the vertex without intravenous contrast.  COMPARISON:  08/18/1998  FINDINGS: No skull fracture is noted. Paranasal sinuses and mastoid air cells are unremarkable. No intracranial hemorrhage, mass effect or midline shift. Mild cerebral atrophy. No acute cortical infarction. No mass lesion is noted on this unenhanced scan. Periventricular white matter decreased attenuation probable due to chronic small vessel ischemic changes.  IMPRESSION: No acute intracranial abnormality. Mild cerebral atrophy. Periventricular white matter decreased attenuation probable due to chronic small vessel ischemic changes.   Electronically Signed   By: Lahoma Crocker M.D.   On: 09/12/2013 16:13   Nm Hepatobiliary  09/12/2013   CLINICAL DATA:  Abdominal pain and possible cholecystitis  EXAM: NUCLEAR MEDICINE HEPATOBILIARY IMAGING  TECHNIQUE: Sequential images of the abdomen were obtained out to 60 minutes following intravenous administration of radiopharmaceutical.  RADIOPHARMACEUTICALS:  5 [employer] SF-68L Choletec  COMPARISON:  Abdominal ultrasound of September 11, 2013.  FINDINGS: There is adequate radiopharmaceutical uptake by the liver. The common bile duct is visible by 25 min as is the gallbladder. Bowel activity becomes visible by approximately 90 min.  IMPRESSION: Normal hepatobiliary scan.  Abdominal pain, possible Select proper NMHepatobiliary template.   Electronically Signed   By: David  Martinique   On: 09/12/2013 12:49   Ct Abdomen Pelvis W Contrast  09/11/2013   CLINICAL DATA:  Abdominal pain, elevated LFTs, mildly compromised renal function  EXAM: CT ABDOMEN AND PELVIS WITH CONTRAST  TECHNIQUE: Multidetector CT imaging of the abdomen and pelvis was  performed using the standard protocol following bolus administration of intravenous contrast.  CONTRAST:  35m OMNIPAQUE IOHEXOL 300 MG/ML  SOLN  COMPARISON:  Abdominal ultrasound of today's date  FINDINGS: The stomach is moderately distended with the oral contrast as well as with gas. The liver exhibits no focal mass or ductal dilation. The gallbladder is adequately distended now with no evidence of stones. The pancreas exhibits no focal mass or definite inflammatory change. The spleen, adrenal glands, and kidneys exhibit no acute abnormalities. There is stable hypodensity in the lower pole of the left kidney which exhibits HU value of 17 most compatible with a cyst. A 1 cm hypodensity in the midpole of the right kidney exhibits HU value of 1.  The contrast has traversed much of the small bowel but has not yet reached the colon. There is no evidence of ileus or obstruction or acute inflammation. There is no inguinal hernia. There is a umbilical hernia containing fat.  The caliber of the abdominal aorta is normal. The small and large bowel exhibit no evidence of ileus, obstruction, or acute inflammation. There is no ascites. The urinary bladder, prostate gland, and seminal vesicles are normal. The lumbar spine and bony pelvis exhibit no acute abnormalities. There are degenerative disc changes at L5-S1. The lung bases exhibit increased densities posteriorly, bilaterally consistent with atelectasis or pneumonia.  IMPRESSION: 1. Bibasilar atelectasis or pneumonia. 2. The gallbladder is visualized and appears normal. There is no acute hepatobiliary abnormality. 3. There is no acute urinary tract or bowel abnormality.   Electronically Signed   By: David  JMartinique  On: 09/11/2013 19:23    Medications / Allergies:  Scheduled Meds: . atorvastatin  40 mg Oral Daily  . budesonide-formoterol  2 puff  Inhalation BID  . citalopram  20 mg Oral Daily  . diltiazem  240 mg Oral Daily  . donepezil  10 mg Oral QHS  . ezetimibe   10 mg Oral Daily  . fluticasone  2 spray Each Nare Daily  . gabapentin  300 mg Oral BID  . hydrALAZINE  100 mg Oral 3 times per day  . insulin aspart  0-15 Units Subcutaneous TID WC  . insulin glargine  15 Units Subcutaneous Daily  . isosorbide mononitrate  120 mg Oral Daily  . mometasone-formoterol  2 puff Inhalation BID  . montelukast  10 mg Oral Daily  . pantoprazole (PROTONIX) IV  40 mg Intravenous QHS  . raloxifene  60 mg Oral Daily   Continuous Infusions: . sodium chloride 150 mL/hr at 09/12/13 2141   PRN Meds:.acetaminophen, albuterol, morphine injection, nitroGLYCERIN, ondansetron, sodium chloride  Antibiotics: Anti-infectives   None        Assessment/Plan RUQ abdominal pain Nausea/vomiting Elevated LFTs -US(8/22) presumably contracted GB.  CT abdomen and pelvis(8/22) normal gallbladder, no other acute findings.  HIDA scan(8/23) normal.  sCr increased from 1.64--->3.53(8/23).  LFTs abnormal upon admission, trending down, normal T bilirubin.   Etiology unclear, ?hepatitis.  Will ask GI to evaluate. -Keep NPO -check AXR -IVF -SCD/heparin -mobilize as tolerated -pain control/anti-emetics  Appreciate IM management Acute on chronic CKD HTN DM2 CAD COPD   Erby Pian, ANP-BC [facility] Pager [phone](7A-4:30P) For consults and floor pages call [phone](7A-4:30P)  09/13/2013 8:09 AM

## 2013-09-13 NOTE — Progress Notes (Signed)
Appreciate GI eval. Hepatitis W/U. I spoke with her husband. Patient examined and I agree with the assessment and plan  Georganna Skeans, MD, MPH, FACS Trauma: [phone] General Surgery: [phone]  09/13/2013 6:07 PM

## 2013-09-13 NOTE — Progress Notes (Signed)
[facility] PROGRESS NOTE  Joann Gutierrez SNK:[id number] DOB: 09-30-1945 DOA: 09/10/2013 PCP: Kandice Hams, MD  Assessment/Plan: Active Problems:   DIABETES MELLITUS, TYPE II   CKD (chronic kidney disease), stage III   Hypertension   Abdominal pain, other specified site   Elevated liver enzymes   1. Acute on chronic CKD 3  1. Baseline Cr appears to be 1.2-1.5, creatinine peaked at 3.53, creatinine improving with IV hydration 2. SBP <100 at this time, continue to hold ARB and metolazone , Lasix 3. Agree with fluid bolus and continue aggressive IV hydration 4. Follow renal function closely  5. This is likely secondary to contrast-induced nephropathy as well as poor PO intake prior to admission in addition to her outpatient medications, no evidence of hydronephrosis on the CT scan 6. Monitor urine output closely-1435 CC in the last 24 hours 7. Kayexalate for hyperkalemia, potassium improved 2. HTN  1. Per above, sbp is <100 presently 2. Hold ARB and metolazone 3. Cont to monitor for now 3. DM 2  1. On SSI coverage alone currently 2. Pt is on 30 units Lantus at baseline 3. As pt is currently NPO, will resume on 1/2 dose to ensure continued basal coverage 4. CAD  1. Stable 2. Prior records reviewed 3. Pt has been follow up multiple times for chest pain as an outpatient, s/p cath in 02/2013 revealing only minimal disease 5. COPD  1. Stable 6. Documented hx of CHF  1. Likely chronic diastolic heart failure without exacerbation 2. Per above, recent cath in 2/15 demonstrated an EF of 70% on ventriculogram 3. Hold Lasix, should be able to tolerate boluses given diastolic heart failure 7. Biliary obstruction with transaminitis -improving 1. Gastroenterology consulted, acute viral hepatitis panel, EBV, CMV, ANA, AMA, ASMA, IgG pending 2. CT abd shows normal appearance of the gallbladder, normal urinary tract 3. LFT's appear to be trending down slowly overnight   Code  Status: full  Family Communication: family updated about patient's clinical progress  Disposition Plan: As above   Brief narrative:  68yo female who presented to the ED with complaints of RUQ pain and increased work of breathing. In the ED, pt was found to have markedly elevated LFT's including alk phos. An abd US revealed a normal appearing liver and CBG but failed to identify a gallbladder. The patient was noted to also have mild leukocytosis of 13k and a low grade temp of 100.79F on admission. The patient was subsequently admitted to the General Surgery service for further work up of biliary colic with transaminitis. The hospitalist service was consulted for consulted for medical management given the complexity of the patient  Consultants:  Gen. surgery Procedures:  None Antibiotics:  None    HPI/Subjective: Patient complaining of nausea this morning  Objective: Filed Vitals:   09/12/13 2121 09/13/13 0235 09/13/13 0522 09/13/13 0753  BP:  140/58 154/62   Pulse:  91    Temp:  98.1 F (36.7 C) 98.3 F (36.8 C)   TempSrc:  Oral Oral   Resp:  16 16   Height:      Weight:   129.094 kg (284 lb 9.6 oz)   SpO2: 99% 97% 95% 97%    Intake/Output Summary (Last 24 hours) at 09/13/13 1354 Last data filed at 09/13/13 0951  Gross per 24 hour  Intake   4611 ml  Output   1700 ml  Net   2911 ml    Exam:  General: alert & oriented x 3 In  NAD  Cardiovascular: RRR, nl S1 s2  Respiratory: Decreased breath sounds at the bases, scattered rhonchi, no crackles  Abdomen: soft +BS NT/ND, no masses palpable  Extremities: No cyanosis and no edema      Data Reviewed: Basic Metabolic Panel:  Recent Labs Lab 09/10/13 2359 09/11/13 0900 09/12/13 0505 09/13/13 0534  NA 143 145 141 141  K 4.2 4.5 5.8* 4.3  CL 103 105 104 105  CO2 '27 27 25 23  ' GLUCOSE 277* 200* 133* 124*  BUN 27* 28* 33* 32*  CREATININE 1.61* 1.64* 3.53* 2.96*  CALCIUM 9.0 8.7 8.0* 7.9*    Liver Function  Tests:  Recent Labs Lab 09/10/13 2359 09/11/13 0900 09/12/13 0505 09/13/13 0534  AST 984* 880* 267* 102*  ALT 310* 392* 238* 160*  ALKPHOS 254* 246* 180* 161*  BILITOT 0.9 0.9 0.4 0.4  PROT 7.5 7.0 6.5 6.5  ALBUMIN 3.3* 3.0* 2.8* 2.7*    Recent Labs Lab 09/10/13 2359  LIPASE 34   No results found for this basename: AMMONIA,  in the last 168 hours  CBC:  Recent Labs Lab 09/10/13 2359 09/11/13 0900 09/12/13 0505 09/13/13 0534  WBC 13.4* 11.8* 10.0 7.8  NEUTROABS 12.1* 9.3*  --   --   HGB 9.6* 8.9* 8.0* 8.3*  HCT 29.4* 27.9* 25.1* 26.9*  MCV 71.2* 73.0* 73.4* 75.4*  PLT 313 269 234 260    Cardiac Enzymes: No results found for this basename: CKTOTAL, CKMB, CKMBINDEX, TROPONINI,  in the last 168 hours BNP (last 3 results)  Recent Labs  12/15/12 1114  PROBNP 24.0     CBG:  Recent Labs Lab 09/12/13 0729 09/12/13 1640 09/12/13 2110 09/13/13 0813 09/13/13 1210  GLUCAP 123* 86 121* 151* 150*    No results found for this or any previous visit (from the past 240 hour(s)).   Studies: Dg Chest 1 View  09/11/2013   CLINICAL DATA:  Difficulty breathing and chest pain  EXAM: CHEST - 1 VIEW  COMPARISON:  September 11, 2013  FINDINGS: Degree of inspiration is shallow. There is no edema or consolidation. Heart is upper normal in size with pulmonary vascularity within normal limits. No pneumothorax. No adenopathy. No bone lesions.  IMPRESSION: Shallow degree of inspiration.  No edema or consolidation.   Electronically Signed   By: Lowella Grip M.D.   On: 09/11/2013 19:20   Ct Head Wo Contrast  09/12/2013   CLINICAL DATA:  Headache, COPD  EXAM: CT HEAD WITHOUT CONTRAST  TECHNIQUE: Contiguous axial images were obtained from the base of the skull through the vertex without intravenous contrast.  COMPARISON:  08/18/1998  FINDINGS: No skull fracture is noted. Paranasal sinuses and mastoid air cells are unremarkable. No intracranial hemorrhage, mass effect or midline  shift. Mild cerebral atrophy. No acute cortical infarction. No mass lesion is noted on this unenhanced scan. Periventricular white matter decreased attenuation probable due to chronic small vessel ischemic changes.  IMPRESSION: No acute intracranial abnormality. Mild cerebral atrophy. Periventricular white matter decreased attenuation probable due to chronic small vessel ischemic changes.   Electronically Signed   By: Lahoma Crocker M.D.   On: 09/12/2013 16:13   Nm Hepatobiliary  09/12/2013   CLINICAL DATA:  Abdominal pain and possible cholecystitis  EXAM: NUCLEAR MEDICINE HEPATOBILIARY IMAGING  TECHNIQUE: Sequential images of the abdomen were obtained out to 60 minutes following intravenous administration of radiopharmaceutical.  RADIOPHARMACEUTICALS:  5 Mill[place]ie JX-91Y Choletec  COMPARISON:  Abdominal ultrasound of September 11, 2013.  FINDINGS: There  is adequate radiopharmaceutical uptake by the liver. The common bile duct is visible by 25 min as is the gallbladder. Bowel activity becomes visible by approximately 90 min.  IMPRESSION: Normal hepatobiliary scan.  Abdominal pain, possible Select proper NMHepatobiliary template.   Electronically Signed   By: David  Martinique   On: 09/12/2013 12:49   Ct Abdomen Pelvis W Contrast  09/11/2013   CLINICAL DATA:  Abdominal pain, elevated LFTs, mildly compromised renal function  EXAM: CT ABDOMEN AND PELVIS WITH CONTRAST  TECHNIQUE: Multidetector CT imaging of the abdomen and pelvis was performed using the standard protocol following bolus administration of intravenous contrast.  CONTRAST:  81m OMNIPAQUE IOHEXOL 300 MG/ML  SOLN  COMPARISON:  Abdominal ultrasound of today's date  FINDINGS: The stomach is moderately distended with the oral contrast as well as with gas. The liver exhibits no focal mass or ductal dilation. The gallbladder is adequately distended now with no evidence of stones. The pancreas exhibits no focal mass or definite inflammatory change. The spleen,  adrenal glands, and kidneys exhibit no acute abnormalities. There is stable hypodensity in the lower pole of the left kidney which exhibits HU value of 17 most compatible with a cyst. A 1 cm hypodensity in the midpole of the right kidney exhibits HU value of 1.  The contrast has traversed much of the small bowel but has not yet reached the colon. There is no evidence of ileus or obstruction or acute inflammation. There is no inguinal hernia. There is a umbilical hernia containing fat.  The caliber of the abdominal aorta is normal. The small and large bowel exhibit no evidence of ileus, obstruction, or acute inflammation. There is no ascites. The urinary bladder, prostate gland, and seminal vesicles are normal. The lumbar spine and bony pelvis exhibit no acute abnormalities. There are degenerative disc changes at L5-S1. The lung bases exhibit increased densities posteriorly, bilaterally consistent with atelectasis or pneumonia.  IMPRESSION: 1. Bibasilar atelectasis or pneumonia. 2. The gallbladder is visualized and appears normal. There is no acute hepatobiliary abnormality. 3. There is no acute urinary tract or bowel abnormality.   Electronically Signed   By: David  JMartinique  On: 09/11/2013 19:23   [place]dom[place] Limited  09/11/2013   CLINICAL DATA:  68year old female with abdominal pain, right upper quadrant pain, abnormal LFTs. Initial encounter.  EXAM: [place]DOM[place] LIMITED - RIGHT UPPER QUADRANT  COMPARISON:  CT Abdomen and Pelvis 01/30/2009.  FINDINGS: Gallbladder:  Not visualized, presumably contracted.  Common bile duct:  Diameter: 6 mm, normal.  Liver:  No focal lesion identified. Within normal limits in parenchymal echogenicity.  IMPRESSION: 1. Gallbladder not identified, presumably contracted. 2. Negative sonographic appearance of the liver and CBD.   Electronically Signed   By: LLars PinksM.D.   On: 09/11/2013 02:18   Dg Abd Acute W/chest  09/11/2013   CLINICAL DATA:  Mid chest and lower abdominal pain   EXAM: ACUTE ABDOMEN SERIES (ABDOMEN 2 VIEW & CHEST 1 VIEW)  COMPARISON:  Prior radiograph from 04/02/2013  FINDINGS: Borderline cardiomegaly is stable as compared to prior study. Mediastinal silhouette within normal limits. Mild tortuosity of the intrathoracic aorta noted.  Lung volumes within normal limits. There is mild perihilar vascular congestion without overt pulmonary edema. No focal infiltrate or pleural effusion. No pneumothorax.  Visualized bowel gas pattern is within normal limits without evidence of obstruction or ileus. No abnormal bowel wall thickening. No free intraperitoneal air.  No soft tissue mass or abnormal calcification.  No  acute osseus abnormality.  IMPRESSION: 1. Nonobstructive bowel gas pattern with no radiographic evidence acute intra-abdominal abnormality. 2. Borderline cardiomegaly with mild perihilar vascular congestion without overt pulmonary edema.   Electronically Signed   By: Jeannine Boga M.D.   On: 09/11/2013 01:11   Dg Abd Portable 1v  09/13/2013   CLINICAL DATA:  Abdominal pain.  EXAM: PORTABLE ABDOMEN - 1 VIEW  COMPARISON:  CT scan 09/11/2013.  FINDINGS: There is some persistent scattered contrast throughout the colon from the recent CT scan. No findings for small bowel obstruction or obvious free air but examination is limited by body habitus.  IMPRESSION: No findings for small bowel obstruction.   Electronically Signed   By: Kalman Jewels M.D.   On: 09/13/2013 08:51    Scheduled Meds: . atorvastatin  40 mg Oral Daily  . budesonide-formoterol  2 puff Inhalation BID  . citalopram  20 mg Oral Daily  . diltiazem  240 mg Oral Daily  . donepezil  10 mg Oral QHS  . ezetimibe  10 mg Oral Daily  . fluticasone  2 spray Each Nare Daily  . gabapentin  300 mg Oral BID  . heparin subcutaneous  5,000 Units Subcutaneous 3 times per day  . hydrALAZINE  100 mg Oral 3 times per day  . insulin aspart  0-15 Units Subcutaneous TID WC  . insulin glargine  15 Units  Subcutaneous Daily  . isosorbide mononitrate  120 mg Oral Daily  . mometasone-formoterol  2 puff Inhalation BID  . montelukast  10 mg Oral Daily  . pantoprazole (PROTONIX) IV  40 mg Intravenous QHS  . raloxifene  60 mg Oral Daily   Continuous Infusions: . sodium chloride 75 mL/hr at 09/13/13 1156    Active Problems:   DIABETES MELLITUS, TYPE II   CKD (chronic kidney disease), stage III   Hypertension   Abdominal pain, other specified site   Elevated liver enzymes    Time spent: 40 minutes   [facility] Pager [phone]. If 7PM-7AM, please contact night-coverage at [email or web address], password [facility] 09/13/2013, 1:54 PM  LOS: 3 days

## 2013-09-13 NOTE — Consult Note (Signed)
Referring Provider: No ref. provider found Primary Care Physician:  Kandice Hams, MD Primary Gastroenterologist:  Dr. Deatra Ina  Reason for Consultation:  RUQ abdominal pain and elevated LFT's  HPI: Joann Gutierrez DOBLE is a 68 y.o. female with PMH of depression, DM 2, HTN, HLD, CAD, history of CVA, asthma/COPD, renal insufficiency/CKD stage 3, CHF, history of seizures.  She presented to [facility] hospital on 8/21 with complaints of RUQ abdominal pain and SOB that started a day or two prior to admission.  Pain was worsening.  She had some nausea but no vomiting.  Surgery admitted the patient for possible biliary colic.  Ultrasound did not visualize the gallbladder and it was presumably contracted.  Liver and CBD appeared normal.  CT scan of the abdomen and pelvis was then performed and showed normal appearing gallbladder with no other acute abnormalities identified.  HIDA was also subsequently normal.  Upon presentation LFTs were extremely elevated at AST 984, ALT 310, ALP 254, normal total bili.  LFT's have trended down since her hospitalization and are currently AST 102, ALT 160, ALP 161, and total bili still remains normal.  From a GI standpoint she recently underwent an EGD on 04/08/2013 by Dr. Deatra Ina for complaints of dysphagia.  There was no obvious stricture but she was dilated with Venia Minks (had some resistance).  She does have a history of an esophageal stricture in 02/2012 that was dilated during EGD as well.  Colonoscopy 11/2010 she had two polyps removed (TA's) and diverticulosis; recall colonoscopy was entered for 5 years.  This morning she did have some vomiting for the first time.  Complains of diffuse abdominal pain but greatest on the right side.  Says that she never had pain like this previously.  When asked to describe the pain she says "it just hurts".  Pain goes into her back.  Denies ETOH use, extensive tylenol use, or NSAID's.  Does not feel like heartburn or indigestion.  No recent travel  or sick contacts to her knowledge.   Past Medical History  Diagnosis Date  . Allergy   . Depression   . Diabetes mellitus 1997    Type II   . GERD (gastroesophageal reflux disease)   . Hypertension   . Hyperlipidemia   . Osteoarthritis   . Osteoporosis   . Hair loss   . Anemia   . CAD (coronary artery disease)     Mild very minimal coronary disease with 20% obtuse marginal stenosis  . CVA (cerebral infarction)   . Diverticulosis   . Esophageal stricture   . Gastritis   . Adenomatous colon polyp   . Sickle cell trait   . Asthma        . COPD (chronic obstructive pulmonary disease)   . 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 Gastroparesis 08/21/2007  . RENAL INSUFFICIENCY 02/16/2009  . OSTEOARTHRITIS 08/09/2006  . Morbid obesity   . CHF (congestive heart failure)   . Carpal tunnel syndrome on left   . 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 Hernia, hiatal   . Seizures     pt thinks it has been several monthes since she had a seisure  . Stroke   . Hearing loss of both ears   . 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 Chronic kidney disease (CKD), stage III (moderate)     Past Surgical History  Procedure Laterality Date  . Abdominal hysterectomy    . Replacement total knee  1998    Left  .  Esophagogastroduodenoscopy  06/25/2006  . Erd  08/08/2000  . Artery biopsy  01/07/2011    Procedure: MINOR BIOPSY TEMPORAL ARTERY;  Surgeon: Haywood Lasso, MD;  Location: [place];  Service: General;  Laterality: Left;  left temporal artery biopsy  . Breast lumpectomy      benign  . Breast lumpectomy      both breast lumps removed   . Hernia repair    . Colonoscopy    . Upper gastrointestinal endoscopy    . Bil foot surgery      Prior to Admission medications   Medication Sig Start Date End Date Taking? Authorizing Provider  albuterol (PROVENTIL) (2.5 MG/3ML) 0.083% nebulizer solution Take 2.5 mg by nebulization every 6 (six) hours as  needed for wheezing or shortness of breath.   Yes Historical Provider, MD  albuterol (VENTOLIN HFA) 108 (90 BASE) MCG/ACT inhaler Inhale 2 puffs into the lungs every 6 (six) hours as needed for shortness of breath. 07/07/12  Yes [name], MD  aspirin 325 MG tablet Take 325 mg by mouth daily.   Yes Historical Provider, MD  aspirin 81 MG tablet Take 81 mg by mouth daily.    Yes Historical Provider, MD  atorvastatin (LIPITOR) 40 MG tablet Take 1 tablet (40 mg total) by mouth daily. 09/07/10  Yes Minus Breeding, MD  budesonide-formoterol [facility]) 160-4.5 MCG/ACT inhaler Inhale 2 puffs into the lungs 2 (two) times daily. 07/08/13  Yes [name], MD  Calcium Carbonate-Vit D-Min (CALTRATE PLUS PO) Take 600 mg by mouth daily.     Yes Historical Provider, MD  cyclobenzaprine (FLEXERIL) 10 MG tablet Take 1 tablet (10 mg total) by mouth daily. 04/29/11  Yes Renato Shin, MD  diclofenac (VOLTAREN) 75 MG EC tablet Take 1 tablet by mouth Daily. 04/25/11  Yes Historical Provider, MD  diltiazem (CARDIZEM CD) 240 MG 24 hr capsule Take 240 mg by mouth daily.     Yes Historical Provider, MD  donepezil (ARICEPT) 10 MG tablet Take 10 mg by mouth at bedtime.   Yes Historical Provider, MD  ezetimibe (ZETIA) 10 MG tablet Take 10 mg by mouth daily.     Yes Historical Provider, MD  fluticasone (FLONASE) 50 MCG/ACT nasal spray Place 2 sprays into both nostrils daily.   Yes Historical Provider, MD  furosemide (LASIX) 80 MG tablet Take 80 mg by mouth 2 (two) times daily. 05/24/11  Yes Modena Jansky, MD  gabapentin (NEURONTIN) 300 MG capsule Take 1 capsule (300 mg total) by mouth 2 (two) times daily. 04/29/11  Yes Renato Shin, MD  hydrALAZINE (APRESOLINE) 100 MG tablet Take 100 mg by mouth 3 (three) times daily.   Yes Historical Provider, MD  insulin glargine (LANTUS SOLOSTAR) 100 UNIT/ML injection Inject 30 Units into the skin every morning. And pen needles 1/day   Yes Historical Provider, MD  insulin NPH (HUMULIN N) 100  UNIT/ML injection Inject 2-13 Units into the skin 3 (three) times daily after meals. Sliding scale with each meal 07/07/12  Yes Renato Shin, MD  isosorbide mononitrate (IMDUR) 120 MG 24 hr tablet Take 1 tablet (120 mg total) by mouth daily. 01/25/13  Yes Minus Breeding, MD  KLOR-CON M20 20 MEQ tablet Take 1 tablet by mouth Daily. 06/19/11  Yes Historical Provider, MD  LIDODERM 5 % Place 1 patch onto the skin daily. As needed for pain 10/20/11  Yes Historical Provider, MD  losartan (COZAAR) 100 MG tablet Take 100 mg by mouth daily.   Yes Historical  Provider, MD  metoCLOPramide (REGLAN) 10 MG tablet Take 1 tablet (10 mg total) by mouth 4 (four) times daily. 07/07/12  Yes Inda Castle, MD  metolazone (ZAROXOLYN) 2.5 MG tablet Take 1 tablet by mouth Daily. 06/19/11  Yes Historical Provider, MD  mometasone-formoterol (DULERA) 100-5 MCG/ACT AERO Inhale 2 puffs into the lungs 2 (two) times daily.   Yes Historical Provider, MD  montelukast (SINGULAIR) 10 MG tablet Take 1 tablet (10 mg total) by mouth daily. 07/07/12  Yes [name], MD  Multiple Vitamin (MULTIVITAMIN PO) Take 1 tablet by mouth daily.     Yes Historical Provider, MD  nitroGLYCERIN (NITROLINGUAL) 0.4 MG/SPRAY spray Place 1 spray under the tongue every 5 (five) minutes x 3 doses as needed for chest pain.   Yes Historical Provider, MD  omeprazole (PRILOSEC) 20 MG capsule Take 20 mg by mouth daily. 08/31/12  Yes Inda Castle, MD  raloxifene (EVISTA) 60 MG tablet Take 60 mg by mouth daily.     Yes Historical Provider, MD  sucralfate (CARAFATE) 1 GM/10ML suspension Take 10 mLs (1 g total) by mouth 2 (two) times daily. 07/07/12  Yes Inda Castle, MD  TraMADol HCl 50 MG TBSO Take 1 tablet by mouth 4 (four) times daily as needed (for pain).    Yes Historical Provider, MD  citalopram (CELEXA) 20 MG tablet Take 20 mg by mouth daily.     Historical Provider, MD  fluticasone (FLONASE) 50 MCG/ACT nasal spray Place 2 sprays into the nose daily. 01/16/12  04/02/13  [name], MD    Current Facility-Administered Medications  Medication Dose Route Frequency Provider Last Rate Last Dose  . 0.9 %  sodium chloride infusion   Intravenous Continuous Reyne Dumas, MD 150 mL/hr at 09/12/13 2141    . acetaminophen (TYLENOL) tablet 650 mg  650 mg Oral Q6H PRN Luella Cook III, MD   650 mg at 09/12/13 2132  . albuterol (PROVENTIL) (2.5 MG/3ML) 0.083% nebulizer solution 2.5 mg  2.5 mg Nebulization Q6H PRN Luella Cook III, MD      . atorvastatin (LIPITOR) tablet 40 mg  40 mg Oral Daily Luella Cook III, MD   40 mg at 09/11/13 1000  . budesonide-formoterol (SYMBICORT) 160-4.5 MCG/ACT inhaler 2 puff  2 puff Inhalation BID Luella Cook III, MD   2 puff at 09/13/13 [phone]  . citalopram (CELEXA) tablet 20 mg  20 mg Oral Daily Luella Cook III, MD   20 mg at 09/11/13 1000  . diltiazem (CARDIZEM CD) 24 hr capsule 240 mg  240 mg Oral Daily Luella Cook III, MD      . donepezil (ARICEPT) tablet 10 mg  10 mg Oral QHS Luella Cook III, MD   10 mg at 09/12/13 2253  . ezetimibe (ZETIA) tablet 10 mg  10 mg Oral Daily Luella Cook III, MD   10 mg at 09/11/13 1000  . fluticasone (FLONASE) 50 MCG/ACT nasal spray 2 spray  2 spray Each Nare Daily Luella Cook III, MD   2 spray at 09/11/13 1024  . gabapentin (NEURONTIN) capsule 300 mg  300 mg Oral BID Luella Cook III, MD   300 mg at 09/12/13 2253  . heparin injection 5,000 Units  5,000 Units Subcutaneous 3 times per day Emina Riebock, NP      . hydrALAZINE (APRESOLINE) tablet 100 mg  100 mg Oral 3 times per day Luella Cook III, MD   100 mg at  09/13/13 0701  . insulin aspart (novoLOG) injection 0-15 Units  0-15 Units Subcutaneous TID WC Luella Cook III, MD   2 Units at 09/12/13 [phone]  . insulin glargine (LANTUS) injection 15 Units  15 Units Subcutaneous Daily Donne Hazel, MD   15 Units at 09/12/13 0957  . isosorbide mononitrate (IMDUR) 24 hr tablet 120 mg  120 mg Oral Daily Luella Cook III, MD   120 mg at 09/11/13 1023  .  mometasone-formoterol (DULERA) 100-5 MCG/ACT inhaler 2 puff  2 puff Inhalation BID Luella Cook III, MD   2 puff at 09/13/13 [phone]  . montelukast (SINGULAIR) tablet 10 mg  10 mg Oral Daily Luella Cook III, MD   10 mg at 09/11/13 1023  . morphine 2 MG/ML injection 2 mg  2 mg Intravenous Q1H PRN Luella Cook III, MD   2 mg at 09/12/13 1324  . nitroGLYCERIN (NITROSTAT) SL tablet 0.4 mg  0.4 mg Sublingual Q5 Min x 3 PRN Luella Cook III, MD      . ondansetron [facility]) injection 4 mg  4 mg Intravenous Q6H PRN Luella Cook III, MD   4 mg at 09/13/13 0800  . pantoprazole (PROTONIX) injection 40 mg  40 mg Intravenous QHS Luella Cook III, MD   40 mg at 09/12/13 2254  . raloxifene (EVISTA) tablet 60 mg  60 mg Oral Daily Luella Cook III, MD   60 mg at 09/11/13 1023  . sodium chloride 0.9 % bolus 250 mL  250 mL Intravenous QID PRN Reyne Dumas, MD        Allergies as of 09/10/2013 - Review Complete 09/10/2013  Allergen Reaction Noted  . Promethazine hcl      Family History  Problem Relation Age of Onset  . Colon cancer Brother   . Cancer Brother     Colon Cancer  . Cancer Mother     Liver Cancer  . Diabetes Mother   . Kidney disease Mother   . Heart disease Mother     age 41's  . Heart disease Father 92    MI  . Heart attack Father     died of MI when pt was 19  . Sickle cell anemia Father   . Diabetes Sister   . Kidney disease Sister   . Heart disease Sister     age 71's  . Allergies Sister   . Diabetes Sister   . Kidney disease Sister   . Heart disease Sister     age 32's  . Esophageal cancer Neg Hx   . Rectal cancer Neg Hx   . Stomach cancer Neg Hx     History   Social History  . Marital Status: Widowed    Spouse Name: N/A    Number of Children: 2  . Years of Education: N/A   Occupational History  . Retired    Social History Main Topics  . Smoking status: Former Smoker -- 0.50 packs/day for 10 years    Quit date: 04/18/1980  . Smokeless tobacco: Never Used  . Alcohol Use:  No  . Drug Use: No  . Sexual Activity: Not Currently   Other Topics Concern  . Not on file   Social History Narrative   Previously worked as a Electrical engineer.   Daily Caffeine Use-Coffee and Tea   Lives with a friend who is her care giver, has home health nurse come out once a week.  She has  family in town- daughter, grand daughter.     Review of Systems: Ten point ROS is O/W negative except as mentioned in HPI.  Physical Exam: Vital signs in last 24 hours: Temp:  [97.1 F (36.2 C)-99.1 F (37.3 C)] 98.3 F (36.8 C) (08/24 0522) Pulse Rate:  [75-91] 91 (08/24 0235) Resp:  [15-16] 16 (08/24 0522) BP: (116-154)/(58-63) 154/62 mmHg (08/24 0522) SpO2:  [95 %-100 %] 97 % (08/24 0753) Weight:  [284 lb 9.6 oz (129.094 kg)] 284 lb 9.6 oz (129.094 kg) (08/24 0522) Last BM Date: 09/12/13 General:  Alert, but sleepy.  Well-developed, well-nourished, cooperative in NAD Head:  Normocephalic and atraumatic. Eyes:  Sclera clear, no icterus.  Conjunctiva pink. Ears:  Normal auditory acuity. Mouth:  No deformity or lesions.   Lungs:  Clear throughout to auscultation.  No wheezes, crackles, or rhonchi.  Heart:  Regular rate and rhythm.  SEM noted. Abdomen:  Soft, non-distended.  BS present.  Diffuse TTP > in the RUQ   Rectal:  Deferred  Msk:  Symmetrical without gross deformities. Pulses:  Normal pulses noted. Extremities:  Without clubbing or edema. Neurologic:  Alert and  oriented x4;  grossly normal neurologically. Skin:  Intact without significant lesions or rashes. Psych:  Alert and cooperative. Normal mood and affect.  Intake/Output from previous day: 08/23 0701 - 08/24 0700 In: 4611 [I.V.:2011; IV Piggyback:2600] Out: [id number] [Urine:1475]  Lab Results:  Recent Labs  09/11/13 0900 09/12/13 0505 09/13/13 0534  WBC 11.8* 10.0 7.8  HGB 8.9* 8.0* 8.3*  HCT 27.9* 25.1* 26.9*  PLT 269 234 260   BMET  Recent Labs  09/11/13 0900 09/12/13 0505 09/13/13 0534  NA 145 141 141    K 4.5 5.8* 4.3  CL 105 104 105  CO2 27 25 23   GLUCOSE 200* 133* 124*  BUN 28* 33* 32*  CREATININE 1.64* 3.53* 2.96*  CALCIUM 8.7 8.0* 7.9*   LFT  Recent Labs  09/13/13 0534  PROT 6.5  ALBUMIN 2.7*  AST 102*  ALT 160*  ALKPHOS 161*  BILITOT 0.4   Studies/Results: Dg Chest 1 View  09/11/2013   CLINICAL DATA:  Difficulty breathing and chest pain  EXAM: CHEST - 1 VIEW  COMPARISON:  September 11, 2013  FINDINGS: Degree of inspiration is shallow. There is no edema or consolidation. Heart is upper normal in size with pulmonary vascularity within normal limits. No pneumothorax. No adenopathy. No bone lesions.  IMPRESSION: Shallow degree of inspiration.  No edema or consolidation.   Electronically Signed   By: Lowella Grip M.D.   On: 09/11/2013 19:20   Ct Head Wo Contrast  09/12/2013   CLINICAL DATA:  Headache, COPD  EXAM: CT HEAD WITHOUT CONTRAST  TECHNIQUE: Contiguous axial images were obtained from the base of the skull through the vertex without intravenous contrast.  COMPARISON:  08/18/1998  FINDINGS: No skull fracture is noted. Paranasal sinuses and mastoid air cells are unremarkable. No intracranial hemorrhage, mass effect or midline shift. Mild cerebral atrophy. No acute cortical infarction. No mass lesion is noted on this unenhanced scan. Periventricular white matter decreased attenuation probable due to chronic small vessel ischemic changes.  IMPRESSION: No acute intracranial abnormality. Mild cerebral atrophy. Periventricular white matter decreased attenuation probable due to chronic small vessel ischemic changes.   Electronically Signed   By: Lahoma Crocker M.D.   On: 09/12/2013 16:13   Nm Hepatobiliary  09/12/2013   CLINICAL DATA:  Abdominal pain and possible cholecystitis  EXAM: NUCLEAR MEDICINE HEPATOBILIARY IMAGING  TECHNIQUE: Sequential images of the abdomen were obtained out to 60 minutes following intravenous administration of radiopharmaceutical.  RADIOPHARMACEUTICALS:  5  [place] [id number] Choletec  COMPARISON:  Abdominal ultrasound of September 11, 2013.  FINDINGS: There is adequate radiopharmaceutical uptake by the liver. The common bile duct is visible by 25 min as is the gallbladder. Bowel activity becomes visible by approximately 90 min.  IMPRESSION: Normal hepatobiliary scan.  Abdominal pain, possible Select proper NMHepatobiliary template.   Electronically Signed   By: David  Martinique   On: 09/12/2013 12:49   Ct Abdomen Pelvis W Contrast  09/11/2013   CLINICAL DATA:  Abdominal pain, elevated LFTs, mildly compromised renal function  EXAM: CT ABDOMEN AND PELVIS WITH CONTRAST  TECHNIQUE: Multidetector CT imaging of the abdomen and pelvis was performed using the standard protocol following bolus administration of intravenous contrast.  CONTRAST:  12mL OMNIPAQUE IOHEXOL 300 MG/ML  SOLN  COMPARISON:  Abdominal ultrasound of today's date  FINDINGS: The stomach is moderately distended with the oral contrast as well as with gas. The liver exhibits no focal mass or ductal dilation. The gallbladder is adequately distended now with no evidence of stones. The pancreas exhibits no focal mass or definite inflammatory change. The spleen, adrenal glands, and kidneys exhibit no acute abnormalities. There is stable hypodensity in the lower pole of the left kidney which exhibits HU value of 17 most compatible with a cyst. A 1 cm hypodensity in the midpole of the right kidney exhibits HU value of 1.  The contrast has traversed much of the small bowel but has not yet reached the colon. There is no evidence of ileus or obstruction or acute inflammation. There is no inguinal hernia. There is a umbilical hernia containing fat.  The caliber of the abdominal aorta is normal. The small and large bowel exhibit no evidence of ileus, obstruction, or acute inflammation. There is no ascites. The urinary bladder, prostate gland, and seminal vesicles are normal. The lumbar spine and bony pelvis exhibit no acute  abnormalities. There are degenerative disc changes at L5-S1. The lung bases exhibit increased densities posteriorly, bilaterally consistent with atelectasis or pneumonia.  IMPRESSION: 1. Bibasilar atelectasis or pneumonia. 2. The gallbladder is visualized and appears normal. There is no acute hepatobiliary abnormality. 3. There is no acute urinary tract or bowel abnormality.   Electronically Signed   By: David  Martinique   On: 09/11/2013 19:23    IMPRESSION:  -68 year old female with RUQ abdominal pain and elevated LFT's:  No evidence of cholecystitis by extensive imaging.  ? Acute hepatitis of unknown etiology. -Anemia:  Appears to be anemia of chronic disease according to iron studies.  Likely due to CKD. -Acute of chronic kidney injury:  Cr increased significantly yesterday but is improved slightly again today.  PLAN: -Will check acute viral hepatitis panel.  Will also check EBV and CMV due to acute symptoms.   -Will check ANA, AMA, ASMA, IgG as well. -Monitor Hgb and trend LFT's.  Will check PT/INR.   ZEHR, JESSICA D.  09/13/2013, 8:32 AM  Pager number [phone]  ________________________________________________________________________  Velora Heckler GI MD note:  I personally examined the patient, reviewed the data and agree with the assessment and plan described above.  Unclear etiology of her abd pains but several imaging tests point away from GB, biliary tract.  Acute hepatitis is possible, sending blood tests for that.  Cr rising and that is probably most concerning now that her LFTs are actually improving.  Family asked about  diet and I think clear liquids trial is OK, will order.   Owens Loffler, MD [facility] Pager [phone]

## 2013-09-14 DIAGNOSIS — R109 Unspecified abdominal pain: Secondary | ICD-10-CM

## 2013-09-14 LAB — COMPREHENSIVE METABOLIC PANEL
ALBUMIN: 2.6 g/dL — AB (ref 3.5–5.2)
ALK PHOS: 137 U/L — AB (ref 39–117)
ALT: 100 U/L — ABNORMAL HIGH (ref 0–35)
ANION GAP: 14 (ref 5–15)
AST: 41 U/L — ABNORMAL HIGH (ref 0–37)
BUN: 38 mg/dL — ABNORMAL HIGH (ref 6–23)
CO2: 23 mEq/L (ref 19–32)
CREATININE: 3.27 mg/dL — AB (ref 0.50–1.10)
Calcium: 8.1 mg/dL — ABNORMAL LOW (ref 8.4–10.5)
Chloride: 108 mEq/L (ref 96–112)
GFR calc Af Amer: 16 mL/min — ABNORMAL LOW (ref >90)
GFR calc non Af Amer: 14 mL/min — ABNORMAL LOW (ref >90)
Glucose, Bld: 136 mg/dL — ABNORMAL HIGH (ref 70–99)
POTASSIUM: 4.2 meq/L (ref 3.7–5.3)
Sodium: 145 mEq/L (ref 137–147)
Total Bilirubin: 0.4 mg/dL (ref 0.3–1.2)
Total Protein: 6.4 g/dL (ref 6.0–8.3)

## 2013-09-14 LAB — CBC
HCT: 25.3 % — ABNORMAL LOW (ref 36.0–46.0)
Hemoglobin: 8 g/dL — ABNORMAL LOW (ref 12.0–15.0)
MCH: 23.5 pg — ABNORMAL LOW (ref 26.0–34.0)
MCHC: 31.6 g/dL (ref 30.0–36.0)
MCV: 74.4 fL — ABNORMAL LOW (ref 78.0–100.0)
Platelets: 240 10*3/uL (ref 150–400)
RBC: 3.4 MIL/uL — ABNORMAL LOW (ref 3.87–5.11)
RDW: 15.3 % (ref 11.5–15.5)
WBC: 7.8 10*3/uL (ref 4.0–10.5)

## 2013-09-14 LAB — GLUCOSE, CAPILLARY
Glucose-Capillary: 139 mg/dL — ABNORMAL HIGH (ref 70–99)
Glucose-Capillary: 148 mg/dL — ABNORMAL HIGH (ref 70–99)
Glucose-Capillary: 161 mg/dL — ABNORMAL HIGH (ref 70–99)
Glucose-Capillary: 111 mg/dL — ABNORMAL HIGH (ref 70–99)

## 2013-09-14 LAB — URINALYSIS, ROUTINE W REFLEX MICROSCOPIC
Bilirubin Urine: NEGATIVE
Glucose, UA: NEGATIVE mg/dL
HGB URINE DIPSTICK: NEGATIVE
KETONES UR: NEGATIVE mg/dL
Leukocytes, UA: NEGATIVE
Nitrite: NEGATIVE
PROTEIN: NEGATIVE mg/dL
Specific Gravity, Urine: 1.015 (ref 1.005–1.030)
Urobilinogen, UA: 0.2 mg/dL (ref 0.0–1.0)
pH: 5 (ref 5.0–8.0)

## 2013-09-14 LAB — LIPASE, BLOOD: LIPASE: 20 U/L (ref 11–59)

## 2013-09-14 LAB — ANA: Anti Nuclear Antibody(ANA): NEGATIVE

## 2013-09-14 LAB — IGG: IgG (Immunoglobin G), Serum: 1330 mg/dL (ref 690–1700)

## 2013-09-14 LAB — CMV IGM: CMV IgM: <8 [AU]/ml (ref <30.00)

## 2013-09-14 LAB — EPSTEIN-BARR VIRUS VCA, IGM: EBV VCA IgM: <10 U/mL (ref <36.0)

## 2013-09-14 LAB — CREATININE, URINE, RANDOM: CREATININE, URINE: 158.07 mg/dL

## 2013-09-14 LAB — MITOCHONDRIAL ANTIBODIES: Mitochondrial M2 Ab, IgG: 0.26 (ref <0.91)

## 2013-09-14 LAB — SODIUM, URINE, RANDOM: Sodium, Ur: 44 mEq/L

## 2013-09-14 MED ORDER — METOCLOPRAMIDE HCL 5 MG/ML IJ SOLN
5.0000 mg | Freq: Four times a day (QID) | INTRAMUSCULAR | Status: DC | PRN
  Administered 2013-09-14: 10 mg via INTRAVENOUS
  Administered 2013-09-14: 5 mg via INTRAVENOUS
  Filled 2013-09-14 (×2): qty 2

## 2013-09-14 MED ORDER — GABAPENTIN 300 MG PO CAPS
300.0000 mg | ORAL_CAPSULE | Freq: Every day | ORAL | Status: DC
  Administered 2013-09-15 – 2013-09-18 (×4): 300 mg via ORAL
  Filled 2013-09-14 (×5): qty 1

## 2013-09-14 MED ORDER — MORPHINE SULFATE 2 MG/ML IJ SOLN
0.5000 mg | INTRAMUSCULAR | Status: DC | PRN
  Administered 2013-09-16: 2 mg via INTRAVENOUS
  Filled 2013-09-14: qty 1

## 2013-09-14 MED ORDER — SODIUM CHLORIDE 0.45 % IV SOLN: INTRAVENOUS | Status: DC

## 2013-09-14 MED ORDER — DEXTROSE 5 % IV SOLN
1.0000 g | INTRAVENOUS | Status: DC
  Administered 2013-09-14 – 2013-09-18 (×5): 1 g via INTRAVENOUS
  Filled 2013-09-14 (×5): qty 10

## 2013-09-14 MED ORDER — SODIUM CHLORIDE 0.45 % IV SOLN
INTRAVENOUS | Status: DC
  Administered 2013-09-14 – 2013-09-15 (×4): via INTRAVENOUS

## 2013-09-14 NOTE — Progress Notes (Signed)
Patient ID: Joann Gutierrez, female   DOB: 1945/03/22, 68 y.o.   MRN: [id number]     [facility]      [address] [place], [place] [address]    Phone: [phone] FAX: [phone]     Subjective: Continues to have n/v and abdominal pain.  LFTs are trending down, normal bilirubin.  Hepatitis panel is negative, EBV negative, CMV is negative, ANA is still pending.  WBC normal.    Objective:  Vital signs:  Filed Vitals:   09/13/13 2038 09/13/13 2110 09/14/13 0150 09/14/13 0520  BP:  138/45 146/52 154/71  Pulse:  93 90 91  Temp:  99.5 F (37.5 C) 99.8 F (37.7 C) 98.2 F (36.8 C)  TempSrc:  Axillary Axillary Axillary  Resp:  _0 Height:      Weight:    287 lb (130.182 kg)  SpO2: 92% 99% 98% 95%    Last BM Date: 09/12/13  Intake/Output   Yesterday:  08/24 0701 - 08/25 0700 In: 1875 [I.V.:1875] Out: 9528 [Urine:1050] This shift:    I/O last 3 completed shifts: In: 3361 [I.V.:3361] Out: [place] [Urine:1725]    Physical Exam:  General: Pt awake/alert/oriented x4 in mild acute distress  Chest: cta. No chest wall pain w good excursion  CV: Pulses intact. Regular rhythm  Abdomen: Soft. Nondistended. Diffusely tender, no guarding. No evidence of peritonitis. No incarcerated hernias.   Problem List:   Active Problems:   DIABETES MELLITUS, TYPE II   CKD (chronic kidney disease), stage III   Hypertension   Abdominal pain, other specified site   Elevated liver enzymes    Results:   Labs: Results for orders placed during the hospital encounter of 09/10/13 (from the past 48 hour(s))  VITAMIN B12     Status: Abnormal   Collection Time    09/12/13  3:30 PM      Result Value Ref Range   Vitamin B-12 1370 (*) 211 - 911 pg/mL   Comment: Performed at [facility]     Status: None   Collection Time    09/12/13  3:30 PM      Result Value Ref Range   Folate >20.0     Comment: (NOTE)     Reference  Ranges            Deficient:       0.4 - 3.3 ng/mL            Indeterminate:   3.4 - 5.4 ng/mL            Normal:              > 5.4 ng/mL     Performed at [facility]     Status: Abnormal   Collection Time    09/12/13  3:30 PM      Result Value Ref Range   Iron 37 (*) 42 - 135 ug/dL   TIBC 199 (*) 250 - 470 ug/dL   Saturation Ratios 19 (*) 20 - 55 %   UIBC 162  125 - 400 ug/dL   Comment: Performed at [facility]     Status: None   Collection Time    09/12/13  3:30 PM      Result Value Ref Range   Ferritin 189  10 - 291 ng/mL   Comment: Performed at [facility]  Status: Abnormal   Collection Time    09/12/13  3:30 PM      Result Value Ref Range   Retic Ct Pct 1.7  0.4 - 3.1 %   RBC. 3.39 (*) 3.87 - 5.11 MIL/uL   Retic Count, Manual 57.6  19.0 - 186.0 K/uL  GLUCOSE, CAPILLARY     Status: None   Collection Time    09/12/13  4:40 PM      Result Value Ref Range   Glucose-Capillary 86  70 - 99 mg/dL  GLUCOSE, CAPILLARY     Status: Abnormal   Collection Time    09/12/13  9:10 PM      Result Value Ref Range   Glucose-Capillary 121 (*) 70 - 99 mg/dL   Comment 1 Notify RN    URINALYSIS, ROUTINE W REFLEX MICROSCOPIC     Status: Abnormal   Collection Time    09/12/13  9:43 PM      Result Value Ref Range   Color, Urine YELLOW  YELLOW   APPearance CLOUDY (*) CLEAR   Specific Gravity, Urine 1.018  1.005 - 1.030   pH 5.0  5.0 - 8.0   Glucose, UA NEGATIVE  NEGATIVE mg/dL   Hgb urine dipstick NEGATIVE  NEGATIVE   Bilirubin Urine NEGATIVE  NEGATIVE   Ketones, ur NEGATIVE  NEGATIVE mg/dL   Protein, ur NEGATIVE  NEGATIVE mg/dL   Urobilinogen, UA 0.2  0.0 - 1.0 mg/dL   Nitrite NEGATIVE  NEGATIVE   Leukocytes, UA MODERATE (*) NEGATIVE  URINE MICROSCOPIC-ADD ON     Status: Abnormal   Collection Time    09/12/13  9:43 PM      Result Value Ref Range   Squamous Epithelial / LPF RARE  RARE   WBC, UA 7-10  <3 WBC/hpf    RBC / HPF 3-6  <3 RBC/hpf   Bacteria, UA RARE  RARE   Casts HYALINE CASTS (*) NEGATIVE  CBC     Status: Abnormal   Collection Time    09/13/13  5:34 AM      Result Value Ref Range   WBC 7.8  4.0 - 10.5 K/uL   RBC 3.57 (*) 3.87 - 5.11 MIL/uL   Hemoglobin 8.3 (*) 12.0 - 15.0 g/dL   HCT 26.9 (*) 36.0 - 46.0 %   MCV 75.4 (*) 78.0 - 100.0 fL   MCH 23.2 (*) 26.0 - 34.0 pg   MCHC 30.9  30.0 - 36.0 g/dL   RDW 15.5  11.5 - 15.5 %   Platelets 260  150 - 400 K/uL  COMPREHENSIVE METABOLIC PANEL     Status: Abnormal   Collection Time    09/13/13  5:34 AM      Result Value Ref Range   Sodium 141  137 - 147 mEq/L   Potassium 4.3  3.7 - 5.3 mEq/L   Comment: DELTA CHECK NOTED   Chloride 105  96 - 112 mEq/L   CO2 23  19 - 32 mEq/L   Glucose, Bld 124 (*) 70 - 99 mg/dL   BUN 32 (*) 6 - 23 mg/dL   Creatinine, Ser 2.96 (*) 0.50 - 1.10 mg/dL   Calcium 7.9 (*) 8.4 - 10.5 mg/dL   Total Protein 6.5  6.0 - 8.3 g/dL   Albumin 2.7 (*) 3.5 - 5.2 g/dL   AST 102 (*) 0 - 37 U/L   ALT 160 (*) 0 - 35 U/L   Alkaline Phosphatase 161 (*) 39 - 117 U/L  Total Bilirubin 0.4  0.3 - 1.2 mg/dL   GFR calc non Af Amer 15 (*) >90 mL/min   GFR calc Af Amer 18 (*) >90 mL/min   Comment: (NOTE)     The eGFR has been calculated using the CKD EPI equation.     This calculation has not been validated in all clinical situations.     eGFR's persistently <90 mL/min signify possible Chronic Kidney     Disease.   Anion gap 13  5 - 15  GLUCOSE, CAPILLARY     Status: Abnormal   Collection Time    09/13/13  8:13 AM      Result Value Ref Range   Glucose-Capillary 151 (*) 70 - 99 mg/dL  HEPATITIS PANEL, ACUTE     Status: None   Collection Time    09/13/13 11:30 AM      Result Value Ref Range   Hepatitis B Surface Ag NEGATIVE  NEGATIVE   HCV Ab NEGATIVE  NEGATIVE   Hep A IgM NON REACTIVE  NON REACTIVE   Hep B C IgM NON REACTIVE  NON REACTIVE   Comment: (NOTE)     High levels of Hepatitis B Core IgM antibody are  detectable     during the acute stage of Hepatitis B. This antibody is used     to differentiate current from past HBV infection.     Performed at [facility]     Status: None   Collection Time    09/13/13 11:30 AM      Result Value Ref Range   EBV VCA IgM <10.0  <36.0 U/mL   Comment: (NOTE)     Reference Range:       <36.0 U/mL = Negative                       36.0-43.9 U/mL = Equivocal                          >=44.0 U/mL = Positive           Clinical Stage            VCA IgG   VCA IgM      EA    EBV NA           Susceptibility               -         -         -       -           Very Early Infection        +/-       +/-        -       -           Established Infection        +         +        +/-      -           Recent Infection             +         +        +/-     +/-           Past Infection               +         -        +/-      +                                                                                     +/-  means positive or negative (not weak)     High persisting antibody levels may be present in Burkitt's lymphoma     and nasopharyngeal carcinoma.     Performed at [facility]  CMV IGM     Status: None   Collection Time    09/13/13 11:30 AM      Result Value Ref Range   CMV IgM <8.00  <30.00 AU/mL   Comment: (NOTE)     Reference Range:        <30.00 AU/mL = Negative                       30.00-34.99 AU/mL = Equivocal                           >=35.00 AU/mL = Positive     Results from any one IgM assay should not be used as a sole     determinant of a current or recent infection. Because an IgM test can     yield false positive results and low levels of IgM antibody may     persist for more than 12 months post infection, reliance on a single     test result could be misleading. If an acute infection is suspected,     consider obtaining a new specimen and submit for both IgG and IgM     testing in two or more  weeks.     Performed at [facility]     Status: None   Collection Time    09/13/13 11:30 AM      Result Value Ref Range   IgG (Immunoglobin G), Serum 1330  690 - 1700 mg/dL   Comment: Performed at [facility]     Status: None   Collection Time    09/13/13 11:30 AM      Result Value Ref Range   Prothrombin Time 13.6  11.6 - 15.2 seconds   INR 1.04  0.00 - 1.49  GLUCOSE, CAPILLARY     Status: Abnormal   Collection Time    09/13/13 12:10 PM      Result Value Ref Range   Glucose-Capillary 150 (*) 70 - 99 mg/dL  GLUCOSE, CAPILLARY     Status: Abnormal   Collection Time    09/13/13  5:25 PM      Result Value Ref Range   Glucose-Capillary 118 (*) 70 - 99 mg/dL  URINALYSIS, ROUTINE W REFLEX MICROSCOPIC     Status: Abnormal   Collection Time    09/13/13  8:50 PM      Result Value Ref Range   Color, Urine YELLOW  YELLOW   APPearance CLOUDY (*) CLEAR   Specific Gravity, Urine 1.023  1.005 - 1.030   pH 5.0  5.0 - 8.0   Glucose, UA NEGATIVE  NEGATIVE mg/dL   Hgb urine dipstick NEGATIVE  NEGATIVE   Bilirubin Urine SMALL (*) NEGATIVE   Ketones, ur NEGATIVE  NEGATIVE mg/dL   Protein, ur 30 (*) NEGATIVE mg/dL   Urobilinogen, UA 0.2  0.0 - 1.0 mg/dL   Nitrite NEGATIVE  NEGATIVE   Leukocytes, UA MODERATE (*) NEGATIVE  URINE MICROSCOPIC-ADD ON     Status: Abnormal   Collection Time    09/13/13  8:50 PM      Result Value Ref Range   Squamous Epithelial / LPF MANY (*) RARE   WBC, UA 11-20  <  3 WBC/hpf   Bacteria, UA FEW (*) RARE   Casts HYALINE CASTS (*) NEGATIVE   Urine-Other MUCOUS PRESENT     Comment: MANY YEAST  GLUCOSE, CAPILLARY     Status: Abnormal   Collection Time    09/13/13 10:21 PM      Result Value Ref Range   Glucose-Capillary 150 (*) 70 - 99 mg/dL  CBC     Status: Abnormal   Collection Time    09/14/13  7:15 AM      Result Value Ref Range   WBC 7.8  4.0 - 10.5 K/uL   RBC 3.40 (*) 3.87 - 5.11 MIL/uL   Hemoglobin 8.0 (*) 12.0 -  15.0 g/dL   HCT 25.3 (*) 36.0 - 46.0 %   MCV 74.4 (*) 78.0 - 100.0 fL   MCH 23.5 (*) 26.0 - 34.0 pg   MCHC 31.6  30.0 - 36.0 g/dL   RDW 15.3  11.5 - 15.5 %   Platelets 240  150 - 400 K/uL  COMPREHENSIVE METABOLIC PANEL     Status: Abnormal   Collection Time    09/14/13  7:15 AM      Result Value Ref Range   Sodium 145  137 - 147 mEq/L   Potassium 4.2  3.7 - 5.3 mEq/L   Chloride 108  96 - 112 mEq/L   CO2 23  19 - 32 mEq/L   Glucose, Bld 136 (*) 70 - 99 mg/dL   BUN 38 (*) 6 - 23 mg/dL   Creatinine, Ser 3.27 (*) 0.50 - 1.10 mg/dL   Calcium 8.1 (*) 8.4 - 10.5 mg/dL   Total Protein 6.4  6.0 - 8.3 g/dL   Albumin 2.6 (*) 3.5 - 5.2 g/dL   AST 41 (*) 0 - 37 U/L   ALT 100 (*) 0 - 35 U/L   Alkaline Phosphatase 137 (*) 39 - 117 U/L   Total Bilirubin 0.4  0.3 - 1.2 mg/dL   GFR calc non Af Amer 14 (*) >90 mL/min   GFR calc Af Amer 16 (*) >90 mL/min   Comment: (NOTE)     The eGFR has been calculated using the CKD EPI equation.     This calculation has not been validated in all clinical situations.     eGFR's persistently <90 mL/min signify possible Chronic Kidney     Disease.   Anion gap 14  5 - 15  GLUCOSE, CAPILLARY     Status: Abnormal   Collection Time    09/14/13  7:58 AM      Result Value Ref Range   Glucose-Capillary 139 (*) 70 - 99 mg/dL    Imaging / Studies: Ct Head Wo Contrast  09/12/2013   CLINICAL DATA:  Headache, COPD  EXAM: CT HEAD WITHOUT CONTRAST  TECHNIQUE: Contiguous axial images were obtained from the base of the skull through the vertex without intravenous contrast.  COMPARISON:  08/18/1998  FINDINGS: No skull fracture is noted. Paranasal sinuses and mastoid air cells are unremarkable. No intracranial hemorrhage, mass effect or midline shift. Mild cerebral atrophy. No acute cortical infarction. No mass lesion is noted on this unenhanced scan. Periventricular white matter decreased attenuation probable due to chronic small vessel ischemic changes.  IMPRESSION: No acute  intracranial abnormality. Mild cerebral atrophy. Periventricular white matter decreased attenuation probable due to chronic small vessel ischemic changes.   Electronically Signed   By: Lahoma Crocker M.D.   On: 09/12/2013 16:13   Nm Hepatobiliary  09/12/2013  CLINICAL DATA:  Abdominal pain and possible cholecystitis  EXAM: NUCLEAR MEDICINE HEPATOBILIARY IMAGING  TECHNIQUE: Sequential images of the abdomen were obtained out to 60 minutes following intravenous administration of radiopharmaceutical.  RADIOPHARMACEUTICALS:  5 [employer] [id number] Choletec  COMPARISON:  Abdominal ultrasound of September 11, 2013.  FINDINGS: There is adequate radiopharmaceutical uptake by the liver. The common bile duct is visible by 25 min as is the gallbladder. Bowel activity becomes visible by approximately 90 min.  IMPRESSION: Normal hepatobiliary scan.  Abdominal pain, possible Select proper NMHepatobiliary template.   Electronically Signed   By: David  Martinique   On: 09/12/2013 12:49   Dg Abd Portable 1v  09/13/2013   CLINICAL DATA:  Abdominal pain.  EXAM: PORTABLE ABDOMEN - 1 VIEW  COMPARISON:  CT scan 09/11/2013.  FINDINGS: There is some persistent scattered contrast throughout the colon from the recent CT scan. No findings for small bowel obstruction or obvious free air but examination is limited by body habitus.  IMPRESSION: No findings for small bowel obstruction.   Electronically Signed   By: Kalman Jewels M.D.   On: 09/13/2013 08:51    Medications / Allergies:  Scheduled Meds: . atorvastatin  40 mg Oral Daily  . budesonide-formoterol  2 puff Inhalation BID  . cefTRIAXone (ROCEPHIN)  IV  1 g Intravenous Q24H  . citalopram  20 mg Oral Daily  . diltiazem  240 mg Oral Daily  . donepezil  10 mg Oral QHS  . ezetimibe  10 mg Oral Daily  . fluticasone  2 spray Each Nare Daily  . gabapentin  300 mg Oral BID  . heparin subcutaneous  5,000 Units Subcutaneous 3 times per day  . hydrALAZINE  100 mg Oral 3 times per day  .  insulin aspart  0-15 Units Subcutaneous TID WC  . insulin glargine  15 Units Subcutaneous Daily  . isosorbide mononitrate  120 mg Oral Daily  . mometasone-formoterol  2 puff Inhalation BID  . montelukast  10 mg Oral Daily  . pantoprazole (PROTONIX) IV  40 mg Intravenous QHS  . raloxifene  60 mg Oral Daily   Continuous Infusions: . sodium chloride 150 mL/hr (09/14/13 0249)   PRN Meds:.acetaminophen, albuterol, morphine injection, nitroGLYCERIN, ondansetron, sodium chloride  Antibiotics: Anti-infectives   Start     Dose/Rate Route Frequency Ordered Stop   09/14/13 0900  cefTRIAXone (ROCEPHIN) 1 g in dextrose 5 % 50 mL IVPB     1 g 100 mL/hr over 30 Minutes Intravenous Every 24 hours 09/14/13 0852        Assessment/Plan RUQ abdominal pain  Nausea/vomiting  Elevated LFTs  -US(8/22) presumably contracted GB. CT abdomen and pelvis(8/22) normal gallbladder, no other acute findings. HIDA scan(8/23) normal.  LFTs are trending down.  Hepatitis panel is negative, EBV, CMV is negative. -Keep NPO  -IVF  -SCD/heparin  -mobilize as tolerated  -pain control/anti-emetics, add reglan -non surgical abdomen  Appreciate IM management  Acute on chronic CKD  HTN  DM2  CAD  COPD   Erby Pian, ANP-BC [facility] Pager [phone](7A-4:30P) For consults and floor pages call [phone](7A-4:30P)  09/14/2013 9:38 AM

## 2013-09-14 NOTE — Progress Notes (Signed)
Pt. Refused to take any PO meds x3 attempt despite prompting by husband at bedside, being afraid to throw up again.Anti-emetic given and noted relief of nausea.  Noted pt. To be sleeping a lot. Continuous O2 sat 95-97% at 2L/min via [place]. No distress noted.

## 2013-09-14 NOTE — Consult Note (Addendum)
Reason for Consult: Acute renal failure on chronic kidney disease stage III Referring Physician: Reyne Dumas MD [facility])  HPI: 68 year old M. normal past medical history significant for chronic kidney disease stage III (baseline creatinine 1.6-1.8) from underlying type 2 diabetes mellitus, hypertension and possibly ischemic nephropathy. She also has a history of obstructive sleep apnea and CAD. I last saw her in the clinic back in May of this year for followup of her chronic kidney disease when she is seemingly doing well.  She was admitted to the hospital 3 days ago to the surgical service with right upper quadrant pain and shortness of breath of 2 days duration. She had some associated nausea but denies vomiting/hematemesis/hematochezia-she reports that this led to a decrease in her oral intake prior to hospitalization she continued to take her medications including losartan, furosemide and diclofenac. Was noticed to have had elevated LFTs-AST 984, ALT 310, total bilirubin 0.9 and alkaline phosphatase 254. Further workup included a CT scan of the abdomen and pelvis with intravenous contrast (80 cc iohexol) done on 8/22 followed by a rise of creatinine thereafter. She received some intravenous fluids with transient improvement of creatinine at concern is raised with her rising creatinine. She had some relative hypotension on the evening of 8/22-blood pressure 94/41 and 94/36. Evaluated by surgery and having found no surgical cause of abdominal pain-was transferred to the hospitalist service today. Gastroenterology evaluating for acute hepatitis-viral studies so far negative.    09/10/2013  09/11/2013  09/12/2013  09/13/2013  09/14/2013   BUN 27 (H) 28 (H) 33 (H) 32 (H) 38 (H)  Creatinine 1.61 (H) 1.64 (H) 3.53 (H) 2.96 (H) 3.27 (H)    Past Medical History  Diagnosis Date  . Allergy   . Depression   . Diabetes mellitus 1997    Type II   . GERD (gastroesophageal reflux disease)   . Hypertension   .  Hyperlipidemia   . Osteoarthritis   . Osteoporosis   . Hair loss   . Anemia   . CAD (coronary artery disease)     Mild very minimal coronary disease with 20% obtuse marginal stenosis  . CVA (cerebral infarction)   . Diverticulosis   . Esophageal stricture   . Gastritis   . Adenomatous colon polyp   . Sickle cell trait   . Asthma        . COPD (chronic obstructive pulmonary disease)   . 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 Gastroparesis 08/21/2007  . RENAL INSUFFICIENCY 02/16/2009  . OSTEOARTHRITIS 08/09/2006  . Morbid obesity   . CHF (congestive heart failure)   . Carpal tunnel syndrome on left   . 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 Hernia, hiatal   . Seizures     pt thinks it has been several monthes since she had a seisure  . Stroke   . Hearing loss of both ears   . 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 Chronic kidney disease (CKD), stage III (moderate)     Past Surgical History  Procedure Laterality Date  . Abdominal hysterectomy    . Replacement total knee  1998    Left  . Esophagogastroduodenoscopy  06/25/2006  . Erd  08/08/2000  . Artery biopsy  01/07/2011    Procedure: MINOR BIOPSY TEMPORAL ARTERY;  Surgeon: Haywood Lasso, MD;  Location: [place];  Service: General;  Laterality: Left;  left temporal artery biopsy  . Breast lumpectomy      benign  .  Breast lumpectomy      both breast lumps removed   . Hernia repair    . Colonoscopy    . Upper gastrointestinal endoscopy    . Bil foot surgery      Family History  Problem Relation Age of Onset  . Colon cancer Brother   . Cancer Brother     Colon Cancer  . Cancer Mother     Liver Cancer  . Diabetes Mother   . Kidney disease Mother   . Heart disease Mother     age 24's  . Heart disease Father 59    MI  . Heart attack Father     died of MI when pt was 5  . Sickle cell anemia Father   . Diabetes Sister   . Kidney disease Sister   . Heart disease Sister      age 78's  . Allergies Sister   . Diabetes Sister   . Kidney disease Sister   . Heart disease Sister     age 80's  . Esophageal cancer Neg Hx   . Rectal cancer Neg Hx   . Stomach cancer Neg Hx     Social History:  reports that she quit smoking about 33 years ago. She has never used smokeless tobacco. She reports that she does not drink alcohol or use illicit drugs.  Allergies:  Allergies  Allergen Reactions  . Promethazine Hcl     REACTION: lethargy    Medications:  Scheduled: . atorvastatin  40 mg Oral Daily  . budesonide-formoterol  2 puff Inhalation BID  . cefTRIAXone (ROCEPHIN)  IV  1 g Intravenous Q24H  . citalopram  20 mg Oral Daily  . diltiazem  240 mg Oral Daily  . donepezil  10 mg Oral QHS  . ezetimibe  10 mg Oral Daily  . fluticasone  2 spray Each Nare Daily  . [START ON 09/15/2013] gabapentin  300 mg Oral Daily  . heparin subcutaneous  5,000 Units Subcutaneous 3 times per day  . hydrALAZINE  100 mg Oral 3 times per day  . insulin aspart  0-15 Units Subcutaneous TID WC  . insulin glargine  15 Units Subcutaneous Daily  . isosorbide mononitrate  120 mg Oral Daily  . montelukast  10 mg Oral Daily  . pantoprazole (PROTONIX) IV  40 mg Intravenous QHS  . raloxifene  60 mg Oral Daily    Results for orders placed during the hospital encounter of 09/10/13 (from the past 48 hour(s))  VITAMIN B12     Status: Abnormal   Collection Time    09/12/13  3:30 PM      Result Value Ref Range   Vitamin B-12 1370 (*) 211 - 911 pg/mL   Comment: Performed at [place]     Status: None   Collection Time    09/12/13  3:30 PM      Result Value Ref Range   Folate >20.0     Comment: (NOTE)     Reference Ranges            Deficient:       0.4 - 3.3 ng/mL            Indeterminate:   3.4 - 5.4 ng/mL            Normal:              > 5.4 ng/mL     Performed at [place]  TIBC     Status: Abnormal   Collection Time    09/12/13  3:30 PM       Result Value Ref Range   Iron 37 (*) 42 - 135 ug/dL   TIBC 199 (*) 250 - 470 ug/dL   Saturation Ratios 19 (*) 20 - 55 %   UIBC 162  125 - 400 ug/dL   Comment: Performed at [place]     Status: None   Collection Time    09/12/13  3:30 PM      Result Value Ref Range   Ferritin 189  10 - 291 ng/mL   Comment: Performed at [place]     Status: Abnormal   Collection Time    09/12/13  3:30 PM      Result Value Ref Range   Retic Ct Pct 1.7  0.4 - 3.1 %   RBC. 3.39 (*) 3.87 - 5.11 MIL/uL   Retic Count, Manual 57.6  19.0 - 186.0 K/uL  GLUCOSE, CAPILLARY     Status: None   Collection Time    09/12/13  4:40 PM      Result Value Ref Range   Glucose-Capillary 86  70 - 99 mg/dL  GLUCOSE, CAPILLARY     Status: Abnormal   Collection Time    09/12/13  9:10 PM      Result Value Ref Range   Glucose-Capillary 121 (*) 70 - 99 mg/dL   Comment 1 Notify RN    URINALYSIS, ROUTINE W REFLEX MICROSCOPIC     Status: Abnormal   Collection Time    09/12/13  9:43 PM      Result Value Ref Range   Color, Urine YELLOW  YELLOW   APPearance CLOUDY (*) CLEAR   Specific Gravity, Urine 1.018  1.005 - 1.030   pH 5.0  5.0 - 8.0   Glucose, UA NEGATIVE  NEGATIVE mg/dL   Hgb urine dipstick NEGATIVE  NEGATIVE   Bilirubin Urine NEGATIVE  NEGATIVE   Ketones, ur NEGATIVE  NEGATIVE mg/dL   Protein, ur NEGATIVE  NEGATIVE mg/dL   Urobilinogen, UA 0.2  0.0 - 1.0 mg/dL   Nitrite NEGATIVE  NEGATIVE   Leukocytes, UA MODERATE (*) NEGATIVE  URINE MICROSCOPIC-ADD ON     Status: Abnormal   Collection Time    09/12/13  9:43 PM      Result Value Ref Range   Squamous Epithelial / LPF RARE  RARE   WBC, UA 7-10  <3 WBC/hpf   RBC / HPF 3-6  <3 RBC/hpf   Bacteria, UA RARE  RARE   Casts HYALINE CASTS (*) NEGATIVE  CBC     Status: Abnormal   Collection Time    09/13/13  5:34 AM      Result Value Ref Range   WBC 7.8  4.0 - 10.5 K/uL   RBC 3.57 (*) 3.87 - 5.11 MIL/uL    Hemoglobin 8.3 (*) 12.0 - 15.0 g/dL   HCT 26.9 (*) 36.0 - 46.0 %   MCV 75.4 (*) 78.0 - 100.0 fL   MCH 23.2 (*) 26.0 - 34.0 pg   MCHC 30.9  30.0 - 36.0 g/dL   RDW 15.5  11.5 - 15.5 %   Platelets 260  150 - 400 K/uL  COMPREHENSIVE METABOLIC PANEL     Status: Abnormal   Collection Time    09/13/13  5:34 AM      Result Value Ref Range   Sodium 141  137 - 147 mEq/L   Potassium 4.3  3.7 - 5.3 mEq/L   Comment: DELTA CHECK NOTED   Chloride 105  96 - 112 mEq/L   CO2 23  19 - 32 mEq/L   Glucose, Bld 124 (*) 70 - 99 mg/dL   BUN 32 (*) 6 - 23 mg/dL   Creatinine, Ser 2.96 (*) 0.50 - 1.10 mg/dL   Calcium 7.9 (*) 8.4 - 10.5 mg/dL   Total Protein 6.5  6.0 - 8.3 g/dL   Albumin 2.7 (*) 3.5 - 5.2 g/dL   AST 102 (*) 0 - 37 U/L   ALT 160 (*) 0 - 35 U/L   Alkaline Phosphatase 161 (*) 39 - 117 U/L   Total Bilirubin 0.4  0.3 - 1.2 mg/dL   GFR calc non Af Amer 15 (*) >90 mL/min   GFR calc Af Amer 18 (*) >90 mL/min   Comment: (NOTE)     The eGFR has been calculated using the CKD EPI equation.     This calculation has not been validated in all clinical situations.     eGFR's persistently <90 mL/min signify possible Chronic Kidney     Disease.   Anion gap 13  5 - 15  GLUCOSE, CAPILLARY     Status: Abnormal   Collection Time    09/13/13  8:13 AM      Result Value Ref Range   Glucose-Capillary 151 (*) 70 - 99 mg/dL  HEPATITIS PANEL, ACUTE     Status: None   Collection Time    09/13/13 11:30 AM      Result Value Ref Range   Hepatitis B Surface Ag NEGATIVE  NEGATIVE   HCV Ab NEGATIVE  NEGATIVE   Hep A IgM NON REACTIVE  NON REACTIVE   Hep B C IgM NON REACTIVE  NON REACTIVE   Comment: (NOTE)     High levels of Hepatitis B Core IgM antibody are detectable     during the acute stage of Hepatitis B. This antibody is used     to differentiate current from past HBV infection.     Performed at [facility], IGM     Status: None   Collection Time    09/13/13 11:30 AM       Result Value Ref Range   EBV VCA IgM <10.0  <36.0 U/mL   Comment: (NOTE)     Reference Range:       <36.0 U/mL = Negative                       36.0-43.9 U/mL = Equivocal                          >=44.0 U/mL = Positive           Clinical Stage            VCA IgG   VCA IgM      EA    EBV NA           Susceptibility               -         -         -       -           Very Early Infection        +/-       +/-        -       -  Established Infection        +         +        +/-      -           Recent Infection             +         +        +/-     +/-           Past Infection               +         -        +/-      +                                                                                     +/- means positive or negative (not weak)     High persisting antibody levels may be present in Burkitt's lymphoma     and nasopharyngeal carcinoma.     Performed at [facility]  CMV IGM     Status: None   Collection Time    09/13/13 11:30 AM      Result Value Ref Range   CMV IgM <8.00  <30.00 AU/mL   Comment: (NOTE)     Reference Range:        <30.00 AU/mL = Negative                       30.00-34.99 AU/mL = Equivocal                           >=35.00 AU/mL = Positive     Results from any one IgM assay should not be used as a sole     determinant of a current or recent infection. Because an IgM test can     yield false positive results and low levels of IgM antibody may     persist for more than 12 months post infection, reliance on a single     test result could be misleading. If an acute infection is suspected,     consider obtaining a new specimen and submit for both IgG and IgM     testing in two or more weeks.     Performed at [facility]  ANA     Status: None   Collection Time    09/13/13 11:30 AM      Result Value Ref Range   ANA NEGATIVE  NEGATIVE   Comment: Performed at [place]: None    Collection Time    09/13/13 11:30 AM      Result Value Ref Range   Mitochondrial M2 Ab, IgG 0.26  <0.91   Comment: (NOTE)     Result        Interpretation     ---------     -----------------------------------------------     <0.91         Negative  0.91-1.09     Equivocal     >=1.10        Positive     Performed at [place]     Status: None   Collection Time    09/13/13 11:30 AM      Result Value Ref Range   IgG (Immunoglobin G), Serum 1330  690 - 1700 mg/dL   Comment: Performed at [place]     Status: None   Collection Time    09/13/13 11:30 AM      Result Value Ref Range   Prothrombin Time 13.6  11.6 - 15.2 seconds   INR 1.04  0.00 - 1.49  GLUCOSE, CAPILLARY     Status: Abnormal   Collection Time    09/13/13 12:10 PM      Result Value Ref Range   Glucose-Capillary 150 (*) 70 - 99 mg/dL  GLUCOSE, CAPILLARY     Status: Abnormal   Collection Time    09/13/13  5:25 PM      Result Value Ref Range   Glucose-Capillary 118 (*) 70 - 99 mg/dL  URINALYSIS, ROUTINE W REFLEX MICROSCOPIC     Status: Abnormal   Collection Time    09/13/13  8:50 PM      Result Value Ref Range   Color, Urine YELLOW  YELLOW   APPearance CLOUDY (*) CLEAR   Specific Gravity, Urine 1.023  1.005 - 1.030   pH 5.0  5.0 - 8.0   Glucose, UA NEGATIVE  NEGATIVE mg/dL   Hgb urine dipstick NEGATIVE  NEGATIVE   Bilirubin Urine SMALL (*) NEGATIVE   Ketones, ur NEGATIVE  NEGATIVE mg/dL   Protein, ur 30 (*) NEGATIVE mg/dL   Urobilinogen, UA 0.2  0.0 - 1.0 mg/dL   Nitrite NEGATIVE  NEGATIVE   Leukocytes, UA MODERATE (*) NEGATIVE  URINE MICROSCOPIC-ADD ON     Status: Abnormal   Collection Time    09/13/13  8:50 PM      Result Value Ref Range   Squamous Epithelial / LPF MANY (*) RARE   WBC, UA 11-20  <3 WBC/hpf   Bacteria, UA FEW (*) RARE   Casts HYALINE CASTS (*) NEGATIVE   Urine-Other MUCOUS PRESENT     Comment: MANY YEAST  GLUCOSE, CAPILLARY     Status: Abnormal    Collection Time    09/13/13 10:21 PM      Result Value Ref Range   Glucose-Capillary 150 (*) 70 - 99 mg/dL  CBC     Status: Abnormal   Collection Time    09/14/13  7:15 AM      Result Value Ref Range   WBC 7.8  4.0 - 10.5 K/uL   RBC 3.40 (*) 3.87 - 5.11 MIL/uL   Hemoglobin 8.0 (*) 12.0 - 15.0 g/dL   HCT 25.3 (*) 36.0 - 46.0 %   MCV 74.4 (*) 78.0 - 100.0 fL   MCH 23.5 (*) 26.0 - 34.0 pg   MCHC 31.6  30.0 - 36.0 g/dL   RDW 15.3  11.5 - 15.5 %   Platelets 240  150 - 400 K/uL  COMPREHENSIVE METABOLIC PANEL     Status: Abnormal   Collection Time    09/14/13  7:15 AM      Result Value Ref Range   Sodium 145  137 - 147 mEq/L   Potassium 4.2  3.7 - 5.3 mEq/L   Chloride 108  96 - 112 mEq/L   CO2  23  19 - 32 mEq/L   Glucose, Bld 136 (*) 70 - 99 mg/dL   BUN 38 (*) 6 - 23 mg/dL   Creatinine, Ser 3.27 (*) 0.50 - 1.10 mg/dL   Calcium 8.1 (*) 8.4 - 10.5 mg/dL   Total Protein 6.4  6.0 - 8.3 g/dL   Albumin 2.6 (*) 3.5 - 5.2 g/dL   AST 41 (*) 0 - 37 U/L   ALT 100 (*) 0 - 35 U/L   Alkaline Phosphatase 137 (*) 39 - 117 U/L   Total Bilirubin 0.4  0.3 - 1.2 mg/dL   GFR calc non Af Amer 14 (*) >90 mL/min   GFR calc Af Amer 16 (*) >90 mL/min   Comment: (NOTE)     The eGFR has been calculated using the CKD EPI equation.     This calculation has not been validated in all clinical situations.     eGFR's persistently <90 mL/min signify possible Chronic Kidney     Disease.   Anion gap 14  5 - 15  GLUCOSE, CAPILLARY     Status: Abnormal   Collection Time    09/14/13  7:58 AM      Result Value Ref Range   Glucose-Capillary 139 (*) 70 - 99 mg/dL  LIPASE, BLOOD     Status: None   Collection Time    09/14/13 10:40 AM      Result Value Ref Range   Lipase 20  11 - 59 U/L  GLUCOSE, CAPILLARY     Status: Abnormal   Collection Time    09/14/13 11:49 AM      Result Value Ref Range   Glucose-Capillary 148 (*) 70 - 99 mg/dL    Ct Head Wo Contrast  09/12/2013   CLINICAL DATA:  Headache, COPD   EXAM: CT HEAD WITHOUT CONTRAST  TECHNIQUE: Contiguous axial images were obtained from the base of the skull through the vertex without intravenous contrast.  COMPARISON:  08/18/1998  FINDINGS: No skull fracture is noted. Paranasal sinuses and mastoid air cells are unremarkable. No intracranial hemorrhage, mass effect or midline shift. Mild cerebral atrophy. No acute cortical infarction. No mass lesion is noted on this unenhanced scan. Periventricular white matter decreased attenuation probable due to chronic small vessel ischemic changes.  IMPRESSION: No acute intracranial abnormality. Mild cerebral atrophy. Periventricular white matter decreased attenuation probable due to chronic small vessel ischemic changes.   Electronically Signed   By: Lahoma Crocker M.D.   On: 09/12/2013 16:13   Dg Abd Portable 1v  09/13/2013   CLINICAL DATA:  Abdominal pain.  EXAM: PORTABLE ABDOMEN - 1 VIEW  COMPARISON:  CT scan 09/11/2013.  FINDINGS: There is some persistent scattered contrast throughout the colon from the recent CT scan. No findings for small bowel obstruction or obvious free air but examination is limited by body habitus.  IMPRESSION: No findings for small bowel obstruction.   Electronically Signed   By: Kalman Jewels M.D.   On: 09/13/2013 08:51    Review of Systems  Constitutional: Positive for malaise/fatigue. Negative for fever.  HENT: Negative for ear pain, hearing loss and tinnitus.   Eyes: Negative.   Respiratory: Positive for shortness of breath. Negative for cough, sputum production and wheezing.   Cardiovascular: Negative for chest pain, orthopnea and leg swelling.  Gastrointestinal: Positive for nausea, abdominal pain and constipation. Negative for diarrhea.       RUQ now global abdominal pain  Genitourinary: Negative.   Musculoskeletal: Negative.   Skin:  Negative for itching and rash.  Neurological: Positive for weakness and headaches. Negative for dizziness and tremors.   Blood pressure  148/62, pulse 94, temperature 98.6 F (37 C), temperature source Oral, resp. rate 18, height _0  (1.626 m), weight 131.725 kg (290 lb 6.4 oz), SpO2 95.00%. Physical Exam  Nursing note and vitals reviewed. Constitutional: She appears well-developed and well-nourished.  Somnolent but arousable to speech  HENT:  Head: Normocephalic and atraumatic.  Nose: Nose normal.  Dry oral mucosa  Eyes: Conjunctivae and EOM are normal. Pupils are equal, round, and reactive to light. No scleral icterus.  Neck: Normal range of motion. Neck supple. No JVD present. No tracheal deviation present. No thyromegaly present.  Cardiovascular: Normal rate, regular rhythm and normal heart sounds.  Exam reveals no friction rub.   No murmur heard. Respiratory: Effort normal and breath sounds normal. No respiratory distress. She has no wheezes. She has no rales.  GI: Soft. Bowel sounds are normal. She exhibits no distension. There is tenderness. There is rebound and guarding.  Musculoskeletal: Normal range of motion. She exhibits edema.  Trace LE edema  Neurological:  Fatigued/somnolent and appears confused/disoriented  Skin: Skin is warm and dry. No rash noted. No erythema.    Assessment/Plan: 1. Acute renal failure on chronic kidney disease stage III: This appears to be most consistent with contrast-induced nephropathy plus/minus ischemic ATN with relative hypotension. Fortunately, she is non-oliguric and without any acute electrolyte concerns or uremic type symptoms to prompt consideration of dialysis. She is mildly hypervolemic on physical exam but with limited oral intake/hypernatremia-agree with gentle intravenous fluids. We'll repeat a urinalysis/urine electrolytes. Do not see any utility of repeating renal ultrasound at this time. Avoid further nephrotoxic exposure at this point and support blood pressure as needed-no nonsteroidal anti-inflammatory drug/intravenous contrast at this time. Suspect that she is indeed  at the plateau phase of ATN and what might possibly see renal recovery follow soon-there are no prediction models to help determine when. 2. Abdominal pain/elevated LFTs: Unclear etiology, ongoing evaluation for acute hepatitis-no surgical source of pain noted. Will defer the decision of stopping statin and possibly even ceftriaxone (all with hepatic injury potential) to gastroenterology. 3. Hypernatremia: Secondary to free water deficit approximately 2 L (likely due to her n.p.o. status and limited oral intake with fluctuating mental status). Switch from NS to 1/2NS at 115m/hr (1200cc free water/24hrs) 4. Hyperkalemia: Noted on 8/23 and currently resolved. Continue to monitor in the face of acute renal insufficiency. 5. Hypertension: Continue to monitor on current therapy (ARB and diuretic on hold).  Pat Elicker K. 09/14/2013, 2:18 PM

## 2013-09-14 NOTE — Care Management Note (Signed)
  Page 1 of 1   09/17/2013     1:56:25 PM CARE MANAGEMENT NOTE 09/17/2013  Patient:  Joann Gutierrez, Joann Gutierrez   Account Number:  [id number]  Date Initiated:  09/13/2013  Documentation initiated by:  Magdalen Spatz  Subjective/Objective Assessment:     Action/Plan:   Anticipated DC Date:     Anticipated DC Plan:           Choice offered to / List presented to:             Status of service:   Medicare Important Message given?  YES (If response is "NO", the following Medicare IM given date fields will be blank) Date Medicare IM given:  09/14/2013 Medicare IM given by:  Magdalen Spatz Date Additional Medicare IM given:  09/17/2013 Additional Medicare IM given by:  Magdalen Spatz  Discharge Disposition:    Per UR Regulation:    If discussed at Long Length of Stay Meetings, dates discussed:   09/15/2013    Comments:

## 2013-09-14 NOTE — Progress Notes (Signed)
     [facility] Progress Note  Subjective:  Is resting fairly comfortably but when you wake her up she whines and moans.  Still complaining of a lot of nausea and abdominal pain.  Some vomiting.  Is not drinking many of her clear liquids and I told her that with ongoing pain and nausea that we should just stop them altogether again until she begins to improved.  Objective:  Vital signs in last 24 hours: Temp:  [98.2 F (36.8 C)-99.8 F (37.7 C)] 98.2 F (36.8 C) (08/25 0520) Pulse Rate:  [88-93] 91 (08/25 0520) Resp:  [14-17] 17 (08/25 0520) BP: (102-154)/(45-71) 154/71 mmHg (08/25 0520) SpO2:  [92 %-99 %] 95 % (08/25 0520) Weight:  [287 lb (130.182 kg)] 287 lb (130.182 kg) (08/25 0520) Last BM Date: 09/12/13 General:  Alert, Well-developed, in NAD Heart:  Regular rate and rhythm; SEM noted Pulm:  CTAB.  No W/R/R Abdomen:  Soft, non-distended.  BS present.  Expresses significant upper abdominal TTP   Extremities:  Without edema. Neurologic:  Alert and oriented x4;  grossly normal neurologically.  Intake/Output from previous day: 08/24 0701 - 08/25 0700 In: 1875 [I.V.:1875] Out: 1050 [Urine:1050]  Lab Results:  Recent Labs  09/12/13 0505 09/13/13 0534 09/14/13 0715  WBC 10.0 7.8 7.8  HGB 8.0* 8.3* 8.0*  HCT 25.1* 26.9* 25.3*  PLT 234 260 240   BMET  Recent Labs  09/11/13 0900 09/12/13 0505 09/13/13 0534  NA 145 141 141  K 4.5 5.8* 4.3  CL 105 104 105  CO2 27 25 23   GLUCOSE 200* 133* 124*  BUN 28* 33* 32*  CREATININE 1.64* 3.53* 2.96*  CALCIUM 8.7 8.0* 7.9*   LFT  Recent Labs  09/13/13 0534  PROT 6.5  ALBUMIN 2.7*  AST 102*  ALT 160*  ALKPHOS 161*  BILITOT 0.4   PT/INR  Recent Labs  09/13/13 1130  LABPROT 13.6  INR 1.04   Hepatitis Panel  Recent Labs  09/13/13 1130  HEPBSAG NEGATIVE  HCVAB NEGATIVE  HEPAIGM NON REACTIVE  HEPBIGM NON REACTIVE    Assessment / Plan: -68 year old female with RUQ abdominal pain and  elevated LFT's: No evidence of cholecystitis by extensive imaging. ? Acute hepatitis of unknown etiology.  LFT's continuing to trend down.  -Anemia: Appears to be anemia of chronic disease according to iron studies. Likely due to CKD.  Hgb is stable.  -Acute of chronic kidney injury: Cr increased some again today.   *Acute viral hepatitis panel and EBV and CMV IgM are all negative.  IgG also normal.  *Await ANA, AMA, ASMA.  *Monitor Hgb and trend LFT's. *Will place her back NPO since she continues to complain of a lot of N/V and abdominal pain.    LOS: 4 days   ZEHR, JESSICA D.  09/14/2013, 8:50 AM  Pager number [phone]   ________________________________________________________________________  [facility] Heckler GI MD note:  I personally examined the patient, reviewed the data and agree with the assessment and plan described above.  Unclear etiology of her acute illness.  LFTs significantly elevated at outset and also ARF.  Perhaps viral illness?  For now, observe clinically with IVF, pain control.  Will follow along.   Owens Loffler, MD [name] Pager [phone]

## 2013-09-14 NOTE — Progress Notes (Signed)
[facility] PROGRESS NOTE  Joann Gutierrez [id number] DOB: Aug 07, 1945 DOA: 09/10/2013 PCP: Kandice Hams, MD  Assessment/Plan: Active Problems:   DIABETES MELLITUS, TYPE II   CKD (chronic kidney disease), stage III   Hypertension   Abdominal pain, other specified site   Elevated liver enzymes    68 y.o. female with PMH of depression, DM 2, HTN, HLD, CAD, history of CVA, asthma/COPD, renal insufficiency/CKD stage 3, CHF, history of seizures. She presented to [facility] hospital on 8/21 with complaints of RUQ abdominal pain and SOB that started a day or two prior to admission. Pain was worsening and radiates to her back. She had some nausea but no vomiting. Surgery admitted the patient for possible biliary colic. Ultrasound did not visualize the gallbladder and it was presumably contracted. Liver and CBD appeared normal. CT scan of the abdomen and pelvis was then performed and showed normal appearing gallbladder with no other acute abnormalities identified. HIDA was also subsequently normal. Upon presentation LFTs were extremely elevated at AST 984, ALT 310, ALP 254, normal total bili. LFT's have trended down since her hospitalization and are currently AST 102, ALT 160, ALP 161, and total bili still remains normal. Denies ETOH use, extensive tylenol or NSAID use. No recent travel or sick contacts to her knowledge.  In her GI history she has undergone an EGD on 04/08/2013 by Dr. Deatra Ina for complaints of dysphagia. There was no obvious stricture but she was dilated with Dr. Venia Minks (had some resistance). She does have a history of an esophageal stricture in 02/2012 that was dilated during EGD as well. Colonoscopy 11/2010 she had two polyps removed (TA's) and diverticulosis; recall colonoscopy was entered for 5 years.  She continues to have N/V. LFT's trending down. Hepatitis panel negative. GI following. EBV negative, CMV negative, ANA pending, WBC remains normal. Serum creatinine is up again today  at 3.27. At this point she does not have a surgical problem and needs to be medically managed. We have discussed her care with Dr. Allyson Sabal and she has agreed to take her on her service for further workup of this patients condition. Continue pain control, antiemetics, IVF. We will stay on as consultants for now, but currently have no other recommendations.    1. Acute on chronic CKD 3  1. Baseline Cr appears to be 1.2-1.5, creatinine peaked at 3.53, improved yesterday but has increased again , nephrology Dr. Posey Pronto consulted 2. SBP <100 at this time, continue to hold ARB and metolazone , Lasix 3. Agree with fluid bolus and continue gentle hydration, IV fluids adjusted by nephrology 4. Follow renal function closely  5. This is likely secondary to contrast-induced nephropathy as well as poor PO intake prior to admission in addition to her outpatient medications, no evidence of hydronephrosis on the CT scan 6. Monitor urine output closely-1435 CC in the last 24 hours 7. Kayexalate for hyperkalemia, potassium improved 2. HTN  1. Per above, sbp is <100 presently 2. Hold ARB and metolazone 3. Cont to monitor for now 3. DM 2  1. On SSI coverage alone currently 2. Pt is on 30 units Lantus at baseline 3. As pt is currently NPO, will resume on 1/2 dose to ensure continued basal coverage 4. CAD  1. Stable 2. Prior records reviewed 3. Pt has been follow up multiple times for chest pain as an outpatient, s/p cath in 02/2013 revealing only minimal disease 5. COPD  1. Stable 6. Documented hx of CHF  1. Likely chronic diastolic heart failure  without exacerbation 2. Per above, recent cath in 2/15 demonstrated an EF of 70% on ventriculogram 3. Hold Lasix, should be able to tolerate boluses given diastolic heart failure 7. Biliary obstruction with transaminitis -improving  1. Gastroenterology consulted, acute viral hepatitis panel, EBV, CMV, ANA, AMA, ASMA, IgG pending 2. CT abd shows normal appearance of the  gallbladder, normal urinary tract 3. LFT's appear to be trending down slowly overnight   Code Status: full  Family Communication: family updated about patient's clinical progress  Disposition Plan: Patient transferred from Gen. surgery to [facility] on 8/25   Consultants:  Gen. surgery Nephrology Procedures:  None Antibiotics:  None  HPI/Subjective: Patient somnolent after receiving pain medicine  Objective: Filed Vitals:   09/14/13 0520 09/14/13 0935 09/14/13 0944 09/14/13 1249  BP: 154/71  148/62   Pulse: 91 92 94   Temp: 98.2 F (36.8 C)  98.6 F (37 C)   TempSrc: Axillary  Oral   Resp: 17 18 18    Height:      Weight: 130.182 kg (287 lb)   131.725 kg (290 lb 6.4 oz)  SpO2: 95% 96% 95%     Intake/Output Summary (Last 24 hours) at 09/14/13 1530 Last data filed at 09/14/13 1400  Gross per 24 hour  Intake   1970 ml  Output    625 ml  Net   1345 ml    Exam:  General: alert & oriented x 3 In NAD  Cardiovascular: RRR, nl S1 s2  Respiratory: Decreased breath sounds at the bases, scattered rhonchi, no crackles  Abdomen: soft +BS NT/ND, no masses palpable  Extremities: No cyanosis and no edema      Data Reviewed: Basic Metabolic Panel:  Recent Labs Lab 09/10/13 2359 09/11/13 0900 09/12/13 0505 09/13/13 0534 09/14/13 0715  NA 143 145 141 141 145  K 4.2 4.5 5.8* 4.3 4.2  CL 103 105 104 105 108  CO2 27 27 25 23 23   GLUCOSE 277* 200* 133* 124* 136*  BUN 27* 28* 33* 32* 38*  CREATININE 1.61* 1.64* 3.53* 2.96* 3.27*  CALCIUM 9.0 8.7 8.0* 7.9* 8.1*    Liver Function Tests:  Recent Labs Lab 09/10/13 2359 09/11/13 0900 09/12/13 0505 09/13/13 0534 09/14/13 0715  AST 984* 880* 267* 102* 41*  ALT 310* 392* 238* 160* 100*  ALKPHOS 254* 246* 180* 161* 137*  BILITOT 0.9 0.9 0.4 0.4 0.4  PROT 7.5 7.0 6.5 6.5 6.4  ALBUMIN 3.3* 3.0* 2.8* 2.7* 2.6*    Recent Labs Lab 09/10/13 2359 09/14/13 1040  LIPASE 34 20   No results found for this basename:  AMMONIA,  in the last 168 hours  CBC:  Recent Labs Lab 09/10/13 2359 09/11/13 0900 09/12/13 0505 09/13/13 0534 09/14/13 0715  WBC 13.4* 11.8* 10.0 7.8 7.8  NEUTROABS 12.1* 9.3*  --   --   --   HGB 9.6* 8.9* 8.0* 8.3* 8.0*  HCT 29.4* 27.9* 25.1* 26.9* 25.3*  MCV 71.2* 73.0* 73.4* 75.4* 74.4*  PLT 313 269 234 260 240    Cardiac Enzymes: No results found for this basename: CKTOTAL, CKMB, CKMBINDEX, TROPONINI,  in the last 168 hours BNP (last 3 results)  Recent Labs  12/15/12 1114  PROBNP 24.0     CBG:  Recent Labs Lab 09/13/13 1210 09/13/13 1725 09/13/13 2221 09/14/13 0758 09/14/13 1149  GLUCAP 150* 118* 150* 139* 148*    No results found for this or any previous visit (from the past 240 hour(s)).   Studies: Dg Chest  1 View  09/11/2013   CLINICAL DATA:  Difficulty breathing and chest pain  EXAM: CHEST - 1 VIEW  COMPARISON:  September 11, 2013  FINDINGS: Degree of inspiration is shallow. There is no edema or consolidation. Heart is upper normal in size with pulmonary vascularity within normal limits. No pneumothorax. No adenopathy. No bone lesions.  IMPRESSION: Shallow degree of inspiration.  No edema or consolidation.   Electronically Signed   By: Lowella Grip M.D.   On: 09/11/2013 19:20   Ct Head Wo Contrast  09/12/2013   CLINICAL DATA:  Headache, COPD  EXAM: CT HEAD WITHOUT CONTRAST  TECHNIQUE: Contiguous axial images were obtained from the base of the skull through the vertex without intravenous contrast.  COMPARISON:  08/18/1998  FINDINGS: No skull fracture is noted. Paranasal sinuses and mastoid air cells are unremarkable. No intracranial hemorrhage, mass effect or midline shift. Mild cerebral atrophy. No acute cortical infarction. No mass lesion is noted on this unenhanced scan. Periventricular white matter decreased attenuation probable due to chronic small vessel ischemic changes.  IMPRESSION: No acute intracranial abnormality. Mild cerebral atrophy.  Periventricular white matter decreased attenuation probable due to chronic small vessel ischemic changes.   Electronically Signed   By: Lahoma Crocker M.D.   On: 09/12/2013 16:13   Nm Hepatobiliary  09/12/2013   CLINICAL DATA:  Abdominal pain and possible cholecystitis  EXAM: NUCLEAR MEDICINE HEPATOBILIARY IMAGING  TECHNIQUE: Sequential images of the abdomen were obtained out to 60 minutes following intravenous administration of radiopharmaceutical.  RADIOPHARMACEUTICALS:  5 [place] [id number] Choletec  COMPARISON:  Abdominal ultrasound of September 11, 2013.  FINDINGS: There is adequate radiopharmaceutical uptake by the liver. The common bile duct is visible by 25 min as is the gallbladder. Bowel activity becomes visible by approximately 90 min.  IMPRESSION: Normal hepatobiliary scan.  Abdominal pain, possible Select proper NMHepatobiliary template.   Electronically Signed   By: David  Martinique   On: 09/12/2013 12:49   Ct Abdomen Pelvis W Contrast  09/11/2013   CLINICAL DATA:  Abdominal pain, elevated LFTs, mildly compromised renal function  EXAM: CT ABDOMEN AND PELVIS WITH CONTRAST  TECHNIQUE: Multidetector CT imaging of the abdomen and pelvis was performed using the standard protocol following bolus administration of intravenous contrast.  CONTRAST:  15mL OMNIPAQUE IOHEXOL 300 MG/ML  SOLN  COMPARISON:  Abdominal ultrasound of today's date  FINDINGS: The stomach is moderately distended with the oral contrast as well as with gas. The liver exhibits no focal mass or ductal dilation. The gallbladder is adequately distended now with no evidence of stones. The pancreas exhibits no focal mass or definite inflammatory change. The spleen, adrenal glands, and kidneys exhibit no acute abnormalities. There is stable hypodensity in the lower pole of the left kidney which exhibits HU value of 17 most compatible with a cyst. A 1 cm hypodensity in the midpole of the right kidney exhibits HU value of 1.  The contrast has traversed  much of the small bowel but has not yet reached the colon. There is no evidence of ileus or obstruction or acute inflammation. There is no inguinal hernia. There is a umbilical hernia containing fat.  The caliber of the abdominal aorta is normal. The small and large bowel exhibit no evidence of ileus, obstruction, or acute inflammation. There is no ascites. The urinary bladder, prostate gland, and seminal vesicles are normal. The lumbar spine and bony pelvis exhibit no acute abnormalities. There are degenerative disc changes at L5-S1. The lung bases exhibit increased  densities posteriorly, bilaterally consistent with atelectasis or pneumonia.  IMPRESSION: 1. Bibasilar atelectasis or pneumonia. 2. The gallbladder is visualized and appears normal. There is no acute hepatobiliary abnormality. 3. There is no acute urinary tract or bowel abnormality.   Electronically Signed   By: David  Martinique   On: 09/11/2013 19:23   US Abdomen Limited  09/11/2013   CLINICAL DATA:  68 year old female with abdominal pain, right upper quadrant pain, abnormal LFTs. Initial encounter.  EXAM: US ABDOMEN LIMITED - RIGHT UPPER QUADRANT  COMPARISON:  CT Abdomen and Pelvis 01/30/2009.  FINDINGS: Gallbladder:  Not visualized, presumably contracted.  Common bile duct:  Diameter: 6 mm, normal.  Liver:  No focal lesion identified. Within normal limits in parenchymal echogenicity.  IMPRESSION: 1. Gallbladder not identified, presumably contracted. 2. Negative sonographic appearance of the liver and CBD.   Electronically Signed   By: Lars Pinks M.D.   On: 09/11/2013 02:18   Dg Abd Acute W/chest  09/11/2013   CLINICAL DATA:  Mid chest and lower abdominal pain  EXAM: ACUTE ABDOMEN SERIES (ABDOMEN 2 VIEW & CHEST 1 VIEW)  COMPARISON:  Prior radiograph from 04/02/2013  FINDINGS: Borderline cardiomegaly is stable as compared to prior study. Mediastinal silhouette within normal limits. Mild tortuosity of the intrathoracic aorta noted.  Lung volumes  within normal limits. There is mild perihilar vascular congestion without overt pulmonary edema. No focal infiltrate or pleural effusion. No pneumothorax.  Visualized bowel gas pattern is within normal limits without evidence of obstruction or ileus. No abnormal bowel wall thickening. No free intraperitoneal air.  No soft tissue mass or abnormal calcification.  No acute osseus abnormality.  IMPRESSION: 1. Nonobstructive bowel gas pattern with no radiographic evidence acute intra-abdominal abnormality. 2. Borderline cardiomegaly with mild perihilar vascular congestion without overt pulmonary edema.   Electronically Signed   By: Jeannine Boga M.D.   On: 09/11/2013 01:11   Dg Abd Portable 1v  09/13/2013   CLINICAL DATA:  Abdominal pain.  EXAM: PORTABLE ABDOMEN - 1 VIEW  COMPARISON:  CT scan 09/11/2013.  FINDINGS: There is some persistent scattered contrast throughout the colon from the recent CT scan. No findings for small bowel obstruction or obvious free air but examination is limited by body habitus.  IMPRESSION: No findings for small bowel obstruction.   Electronically Signed   By: Kalman Jewels M.D.   On: 09/13/2013 08:51    Scheduled Meds: . atorvastatin  40 mg Oral Daily  . budesonide-formoterol  2 puff Inhalation BID  . cefTRIAXone (ROCEPHIN)  IV  1 g Intravenous Q24H  . citalopram  20 mg Oral Daily  . diltiazem  240 mg Oral Daily  . donepezil  10 mg Oral QHS  . ezetimibe  10 mg Oral Daily  . fluticasone  2 spray Each Nare Daily  . [START ON 09/15/2013] gabapentin  300 mg Oral Daily  . heparin subcutaneous  5,000 Units Subcutaneous 3 times per day  . hydrALAZINE  100 mg Oral 3 times per day  . insulin aspart  0-15 Units Subcutaneous TID WC  . insulin glargine  15 Units Subcutaneous Daily  . isosorbide mononitrate  120 mg Oral Daily  . montelukast  10 mg Oral Daily  . pantoprazole (PROTONIX) IV  40 mg Intravenous QHS  . raloxifene  60 mg Oral Daily   Continuous Infusions: .  sodium chloride 100 mL/hr at 09/14/13 1447    Active Problems:   DIABETES MELLITUS, TYPE II   CKD (chronic kidney disease), stage  III   Hypertension   Abdominal pain, other specified site   Elevated liver enzymes    Time spent: 40 minutes   [facility] Pager [phone]. If 7PM-7AM, please contact night-coverage at [email or web address], password [facility] 09/14/2013, 3:30 PM  LOS: 4 days

## 2013-09-14 NOTE — Progress Notes (Signed)
Patient was more awake this afternoon.  Pt. Requested to be up in bed and into the chair after care is done to her.  Will endorse to night shift nurse.

## 2013-09-14 NOTE — Progress Notes (Signed)
Appreciate GI input. No surgical intervention at this time. Patient examined and I agree with the assessment and plan  Georganna Skeans, MD, MPH, FACS Trauma: [phone] General Surgery: [phone]  09/14/2013 11:24 AM

## 2013-09-14 NOTE — Discharge Summary (Signed)
[facility] Surgery Physician Transfer Summary   Patient ID: Joann Gutierrez MRN: [id number] DOB/AGE: [birth year] 68 y.o.  Admit date: 09/10/2013 Transfer date: 09/14/2013 MD accepting transfer: Dr. Allyson Sabal  Admitting Diagnosis: RUQ Abdominal pain Transaminitis SOB Nausea   Current Diagnosis Patient Active Problem List   Diagnosis Date Noted  . Abdominal pain, other specified site 09/13/2013  . Elevated liver enzymes 09/13/2013  . Cough 05/05/2013  . Hemoptysis 04/02/2013  . Pain in lower limb 03/05/2013  . Dyspnea 02/03/2013  . Chronic respiratory failure 12/13/2012  . Nasal sinus congestion 12/13/2012  . Stricture and stenosis of esophagus 08/31/2012  . Onychomycosis 06/01/2012  . Pain in joint, ankle and foot 06/01/2012  . Type I (juvenile type) diabetes mellitus with renal manifestations, not stated as uncontrolled(250.41) 04/29/2012  . Hypertension 07/19/2011  . Anemia 05/19/2011  . CKD (chronic kidney disease), stage III 05/19/2011  . Family history of malignant neoplasm of gastrointestinal tract 10/30/2010  . Bloating 10/16/2010  . Epigastric pain 10/16/2010  . Foot pain 09/25/2010  . Dysphagia 07/16/2010  . GERD (gastroesophageal reflux disease) 07/16/2010  . Microcytic anemia 07/16/2010  . OBSTRUCTIVE SLEEP APNEA 06/16/2009  . HYPERCHOLESTEROLEMIA 02/16/2009  . 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 GASTROPARESIS 08/21/2007  . DIABETES MELLITUS, TYPE II 08/09/2006  . Chronic obstructive asthma 08/09/2006    Consultants Treatment Team: Milus Banister, MD - Gastrointerology Md Ccs, MD - General Surgery  Imaging: Ct Head Wo Contrast  09/12/2013   CLINICAL DATA:  Headache, COPD  EXAM: CT HEAD WITHOUT CONTRAST  TECHNIQUE: Contiguous axial images were obtained from the base of the skull through the vertex without intravenous contrast.  COMPARISON:  08/18/1998  FINDINGS: No skull fracture is noted. Paranasal sinuses and mastoid air cells are  unremarkable. No intracranial hemorrhage, mass effect or midline shift. Mild cerebral atrophy. No acute cortical infarction. No mass lesion is noted on this unenhanced scan. Periventricular white matter decreased attenuation probable due to chronic small vessel ischemic changes.  IMPRESSION: No acute intracranial abnormality. Mild cerebral atrophy. Periventricular white matter decreased attenuation probable due to chronic small vessel ischemic changes.   Electronically Signed   By: Lahoma Crocker M.D.   On: 09/12/2013 16:13   Dg Abd Portable 1v  09/13/2013   CLINICAL DATA:  Abdominal pain.  EXAM: PORTABLE ABDOMEN - 1 VIEW  COMPARISON:  CT scan 09/11/2013.  FINDINGS: There is some persistent scattered contrast throughout the colon from the recent CT scan. No findings for small bowel obstruction or obvious free air but examination is limited by body habitus.  IMPRESSION: No findings for small bowel obstruction.   Electronically Signed   By: Kalman Jewels M.D.   On: 09/13/2013 08:51    Procedures Procedures-none   Hospital Course:  68 y.o. female with PMH of depression, DM 2, HTN, HLD, CAD, history of CVA, asthma/COPD, renal insufficiency/CKD stage 3, CHF, history of seizures. She presented to [facility] hospital on 8/21 with complaints of RUQ abdominal pain and SOB that started a day or two prior to admission. Pain was worsening and radiates to her back. She had some nausea but no vomiting. Surgery admitted the patient for possible biliary colic. Ultrasound did not visualize the gallbladder and it was presumably contracted. Liver and CBD appeared normal. CT scan of the abdomen and pelvis was then performed and showed normal appearing gallbladder with no other acute abnormalities identified. HIDA was also subsequently normal. Upon presentation LFTs were extremely elevated at AST 984, ALT 310, ALP 254,  normal total bili. LFT's have trended down since her hospitalization and are currently AST 102, ALT 160, ALP 161, and  total bili still remains normal. Denies ETOH use, extensive tylenol or NSAID use.  No recent travel or sick contacts to her knowledge.  In her GI history she has undergone an EGD on 04/08/2013 by Dr. Deatra Ina for complaints of dysphagia. There was no obvious stricture but she was dilated with Dr. Venia Minks (had some resistance). She does have a history of an esophageal stricture in 02/2012 that was dilated during EGD as well. Colonoscopy 11/2010 she had two polyps removed (TA's) and diverticulosis; recall colonoscopy was entered for 5 years.   She continues to have N/V.  LFT's trending down.  Hepatitis panel negative.  GI following.  EBV negative, CMV negative, ANA pending, WBC remains normal.  Serum creatinine is up again today at 3.27.  At this point she does not have a surgical problem and needs to be medically managed.  We have discussed her care with Dr. Allyson Sabal and she has agreed to take her on her service for further workup of this patients condition.  Continue pain control, antiemetics, IVF.  We will stay on as consultants for now, but currently have no other recommendations.     Physical Exam: General: Pt awake/alert/oriented x4 in mild acute distress  Chest: cta. No chest wall pain w good excursion  CV: Pulses intact. Regular rhythm  Abdomen: Soft. Nondistended. Diffusely tender, no guarding. No evidence of peritonitis. No incarcerated hernias.    Signed: Coralie Keens, [facility] Surgery [phone] Pager: [phone]  09/14/2013, 1:20 PM

## 2013-09-14 NOTE — Discharge Summary (Signed)
Maleek Craver, MD, MPH, FACS Trauma: [phone] General Surgery: [phone]  

## 2013-09-15 ENCOUNTER — Inpatient Hospital Stay (HOSPITAL_COMMUNITY): Payer: Medicare Other

## 2013-09-15 LAB — URINE CULTURE: Colony Count: 70000

## 2013-09-15 LAB — COMPREHENSIVE METABOLIC PANEL
ALK PHOS: 140 U/L — AB (ref 39–117)
ALT: 77 U/L — AB (ref 0–35)
AST: 23 U/L (ref 0–37)
Albumin: 2.8 g/dL — ABNORMAL LOW (ref 3.5–5.2)
Anion gap: 14 (ref 5–15)
BILIRUBIN TOTAL: 0.4 mg/dL (ref 0.3–1.2)
BUN: 36 mg/dL — ABNORMAL HIGH (ref 6–23)
CHLORIDE: 105 meq/L (ref 96–112)
CO2: 23 meq/L (ref 19–32)
Calcium: 8.5 mg/dL (ref 8.4–10.5)
Creatinine, Ser: 2.63 mg/dL — ABNORMAL HIGH (ref 0.50–1.10)
GFR calc non Af Amer: 18 mL/min — ABNORMAL LOW (ref >90)
GFR, EST AFRICAN AMERICAN: 20 mL/min — AB (ref >90)
GLUCOSE: 164 mg/dL — AB (ref 70–99)
POTASSIUM: 3.9 meq/L (ref 3.7–5.3)
SODIUM: 142 meq/L (ref 137–147)
Total Protein: 6.8 g/dL (ref 6.0–8.3)

## 2013-09-15 LAB — GLUCOSE, CAPILLARY
GLUCOSE-CAPILLARY: 144 mg/dL — AB (ref 70–99)
GLUCOSE-CAPILLARY: 202 mg/dL — AB (ref 70–99)
Glucose-Capillary: 131 mg/dL — ABNORMAL HIGH (ref 70–99)
Glucose-Capillary: 128 mg/dL — ABNORMAL HIGH (ref 70–99)

## 2013-09-15 LAB — ANTI-SMOOTH MUSCLE ANTIBODY, IGG: F-ACTIN AB IGG: 13 U (ref <20)

## 2013-09-15 NOTE — Progress Notes (Signed)
Patient ID: Joann Gutierrez, female   DOB: 1945/10/13, 68 y.o.   MRN: [id number]  [facility] Progress Note    Assessment/ Plan:   1. Acute renal failure on chronic kidney disease stage III: Appears to be from contrast-induced nephropathy and possibly hemodynamic/ischemic ATN from relative hypotension. Improving renal function noted with gentle intravenous fluids (oral intake was not satisfactory), fair urine output and no acute electrolyte concerns at this time. Continue to hold ARB/diuretics. 2. Abdominal pain/elevated LFTs: The exact etiology of this remains unclear-not a surgical source per workup. Ongoing GI evaluation for acute hepatitis (viral studies so far negative). Defer decision of stopping statin to GI.  3. Hypernatremia: Improving on half-normal saline, continue to monitor. Overall mental status better and hopefully her oral intake will improve so that we can discontinue intravenous fluids. 4. Hypertension: Continue to monitor on current therapy (ARB and diuretic on hold).  Subjective:   Reports to be feeling better this morning still with some abdominal pain. We discussed for her to increase fluid intake by mouth    Objective:   BP 165/69  Pulse 85  Temp(Src) 98.7 F (37.1 C) (Oral)  Resp 16  Ht 5\' 4"  (1.626 m)  Wt 131.997 kg (291 lb)  BMI 49.93 kg/m2  SpO2 95%  Intake/Output Summary (Last 24 hours) at 09/15/13 [id number] Last data filed at 09/15/13 0630  Gross per 24 hour  Intake   1725 ml  Output   1075 ml  Net    650 ml   Weight change: 1.542 kg (3 lb 6.4 oz)  Physical Exam: Gen: More awake and alert-able to answer questions well today CVS: Pulse regular in rate and rhythm, S1 and S2 normal Resp: Clear to auscultation bilaterally-no rales/rhonchi Abd: Soft, obese,  Tender primarily over the upper quadrants Ext: Trace to 1+ lower extremity edema  Imaging: No results found.  Labs: BMET  Recent Labs Lab 09/10/13 2359 09/11/13 0900  09/12/13 0505 09/13/13 0534 09/14/13 0715 09/15/13 0605  NA 143 145 141 141 145 142  K 4.2 4.5 5.8* 4.3 4.2 3.9  CL 103 105 104 105 108 105  CO2 27 27 25 23 23 23   GLUCOSE [id number]* 200* 133* 124* 136* 164*  BUN 27* 28* 33* 32* 38* 36*  CREATININE 1.61* 1.64* 3.53* 2.96* 3.27* 2.63*  CALCIUM 9.0 8.7 8.0* 7.9* 8.1* 8.5   CBC  Recent Labs Lab 09/10/13 2359 09/11/13 0900 09/12/13 0505 09/13/13 0534 09/14/13 0715  WBC 13.4* 11.8* 10.0 7.8 7.8  NEUTROABS 12.1* 9.3*  --   --   --   HGB 9.6* 8.9* 8.0* 8.3* 8.0*  HCT 29.4* 27.9* 25.1* 26.9* 25.3*  MCV 71.2* 73.0* 73.4* 75.4* 74.4*  PLT 313 269 234 260 240    Medications:    . atorvastatin  40 mg Oral Daily  . budesonide-formoterol  2 puff Inhalation BID  . cefTRIAXone (ROCEPHIN)  IV  1 g Intravenous Q24H  . citalopram  20 mg Oral Daily  . diltiazem  240 mg Oral Daily  . donepezil  10 mg Oral QHS  . ezetimibe  10 mg Oral Daily  . fluticasone  2 spray Each Nare Daily  . gabapentin  300 mg Oral Daily  . heparin subcutaneous  5,000 Units Subcutaneous 3 times per day  . hydrALAZINE  100 mg Oral 3 times per day  . insulin aspart  0-15 Units Subcutaneous TID WC  . insulin glargine  15 Units Subcutaneous Daily  . isosorbide mononitrate  120 mg Oral Daily  . montelukast  10 mg Oral Daily  . pantoprazole (PROTONIX) IV  40 mg Intravenous QHS  . raloxifene  60 mg Oral Daily   [name], MD 09/15/2013, 9:11 AM

## 2013-09-15 NOTE — Progress Notes (Addendum)
[facility]                                                                              Patient Demographics  Joann Gutierrez, is a 68 y.o. female, DOB - Sep 17, 1945, [id number]  Admit date - 09/10/2013   Admitting Physician Md Edison Pace, MD  Outpatient Primary MD for the patient is Kandice Hams, MD  LOS - 5   Chief Complaint  Patient presents with  . Abdominal Pain      HPI/Interim history 68 y.o. female with PMH of depression, DM 2, HTN, HLD, CAD, history of CVA, asthma/COPD, renal insufficiency/CKD stage 3, CHF, history of seizures. She presented to [facility] hospital on 8/21 with complaints of RUQ abdominal pain and SOB that started a day or two prior to admission. Pain was worsening and radiated to her back. She had some nausea but no vomiting. Surgery admitted the patient for possible biliary colic. Ultrasound did not visualize the gallbladder and it was presumably contracted. Liver and CBD appeared normal. CT scan of the abdomen and pelvis was then performed and showed normal appearing gallbladder with no other acute abnormalities identified. HIDA was also subsequently normal. Upon presentation, LFTs were extremely elevated at AST 984, ALT 310, ALP 254, normal total bili. LFT's have trended down since her hospitalization and are currently AST 102, ALT 160, ALP 161, and total bili still remains normal. Denied ETOH use, extensive tylenol or NSAID use. No recent travel or sick contacts to her knowledge.   In her GI history, she has undergone an EGD on 04/08/2013 by Dr. Deatra Ina for complaints of dysphagia. There was no obvious stricture but she was dilated with Dr. Venia Minks (had some resistance). She does have a history of an esophageal stricture in 02/2012 that was dilated during EGD as well. Colonoscopy 11/2010 she had two polyps removed (TA's) and diverticulosis; recall colonoscopy was entered for 5 years.  She continued to have N/V. LFT's trending down. Hepatitis panel negative. GI  following. EBV negative, CMV negative, ANA pending, WBC remains normal. Serum creatinine is up again today at 3.27. At this point, she does not have a surgical problem and needs to be medically managed.   Assessment & Plan   Acute on chronic kidney disease, stage III -Baseline creatinine appears to be 1.2-1.5, creatinine peaked at 3.53 -Creatinine trending downward -Nephrology consulted and appreciated -ARB and metolazone and Lasix currently held -Continue IV fluids -Possibly secondary to contrast-induced nephropathy as well as poor oral intake prior to admission as well as outpatient medications -No hydronephrosis on CT scan -Patient has good urine output  Hyperkalemia -Resolved  Hypertension -ARB, metolazone and Lasix currently held  Diabetes mellitus type 2 -Continue insulin sliding scale and Lantus with CBG monitoring  Coronary artery disease -Currently stable, patient is chest pain-free -Patient has been seen as an outpatient for chest pain, she had a cath in the 02/2013 revealing minimal disease  COPD -Currently stable  Chronic diastolic heart failure -Currently compensated, EF 70% (cath in February 2015) -Lasix currently held due to to acute kidney injury -Continue to monitor daily weights, intake and output  Biliary obstruction with transaminitis -Appears to be improving -Gastroenterology as well as jejunal surgery  consulted and appreciated -Hepatitis panel negative,?viral hepatitis -CMV, EBV negative -IgG, ANA, AMA negative normal -CT of the abdomen showed normal appearance of gallbladder, normal urinary tract -Continue NPO status  Microcytic Anemia -Appears to be chronic, possibly secondary to chronic disease -Hemoglobin appears to be stable at this point -Continue to monitor CBC  Code Status: Full  Family Communication: None at bedside  Disposition Plan: Admitted  Time Spent in minutes   30 minutes  Procedures  none  Consults    Nephrology Gastroenterology General surgery  DVT Prophylaxis  Heparin  Lab Results  Component Value Date   PLT 240 09/14/2013    Medications  Scheduled Meds: . atorvastatin  40 mg Oral Daily  . budesonide-formoterol  2 puff Inhalation BID  . cefTRIAXone (ROCEPHIN)  IV  1 g Intravenous Q24H  . citalopram  20 mg Oral Daily  . diltiazem  240 mg Oral Daily  . donepezil  10 mg Oral QHS  . ezetimibe  10 mg Oral Daily  . fluticasone  2 spray Each Nare Daily  . gabapentin  300 mg Oral Daily  . heparin subcutaneous  5,000 Units Subcutaneous 3 times per day  . hydrALAZINE  100 mg Oral 3 times per day  . insulin aspart  0-15 Units Subcutaneous TID WC  . insulin glargine  15 Units Subcutaneous Daily  . isosorbide mononitrate  120 mg Oral Daily  . montelukast  10 mg Oral Daily  . pantoprazole (PROTONIX) IV  40 mg Intravenous QHS  . raloxifene  60 mg Oral Daily   Continuous Infusions: . sodium chloride 100 mL/hr at 09/15/13 1118   PRN Meds:.acetaminophen, albuterol, metoCLOPramide (REGLAN) injection, morphine injection, nitroGLYCERIN, ondansetron, sodium chloride  Antibiotics    Anti-infectives   Start     Dose/Rate Route Frequency Ordered Stop   09/14/13 0900  cefTRIAXone (ROCEPHIN) 1 g in dextrose 5 % 50 mL IVPB     1 g 100 mL/hr over 30 Minutes Intravenous Every 24 hours 09/14/13 [id number]        Subjective:   Joann Gutierrez seen and examined today.  Patient continues to complain of abdominal pain, nausea and vomiting. She denies any chest pain or shortness of breath at this time.  Objective:   Filed Vitals:   09/14/13 2010 09/14/13 2136 09/15/13 0557 09/15/13 0640  BP:  147/65 165/69   Pulse: 81 87 85   Temp:  99 F (37.2 C) 98.7 F (37.1 C)   TempSrc:  Oral Oral   Resp: 16 18 16    Height:      Weight:    131.997 kg (291 lb)  SpO2: 97% 95% 95%     Wt Readings from Last 3 Encounters:  09/15/13 131.997 kg (291 lb)  06/02/13 122.471 kg (270 lb)  05/05/13  121.564 kg (268 lb)     Intake/Output Summary (Last 24 hours) at 09/15/13 1316 Last data filed at 09/15/13 0900  Gross per 24 hour  Intake   1725 ml  Output   1075 ml  Net    650 ml    Exam  General: Well developed, well nourished, NAD, appears stated age  HEENT: NCAT, PERRLA, EOMI, Anicteic Sclera, mucous membranes moist.   Cardiovascular: S1 S2 auscultated,3/6 SEM. Regular rate and rhythm.  Respiratory: Clear to auscultation bilaterally with equal chest rise  Abdomen: Soft, diffusely tender, nondistended, + bowel sounds  Extremities: warm dry without cyanosis clubbing or edema  Neuro: AAOx3, cranial nerves grossly intact. Strength equal bilaterally upper and  lower extremities  Skin: Without rashes exudates or nodules  Psych: Normal affect and demeanor with intact judgement and insight  Data Review   Micro Results No results found for this or any previous visit (from the past 240 hour(s)).  Radiology Reports Dg Chest 1 View  09/11/2013   CLINICAL DATA:  Difficulty breathing and chest pain  EXAM: CHEST - 1 VIEW  COMPARISON:  September 11, 2013  FINDINGS: Degree of inspiration is shallow. There is no edema or consolidation. Heart is upper normal in size with pulmonary vascularity within normal limits. No pneumothorax. No adenopathy. No bone lesions.  IMPRESSION: Shallow degree of inspiration.  No edema or consolidation.   Electronically Signed   By: Lowella Grip M.D.   On: 09/11/2013 19:20   Ct Head Wo Contrast  09/12/2013   CLINICAL DATA:  Headache, COPD  EXAM: CT HEAD WITHOUT CONTRAST  TECHNIQUE: Contiguous axial images were obtained from the base of the skull through the vertex without intravenous contrast.  COMPARISON:  08/18/1998  FINDINGS: No skull fracture is noted. Paranasal sinuses and mastoid air cells are unremarkable. No intracranial hemorrhage, mass effect or midline shift. Mild cerebral atrophy. No acute cortical infarction. No mass lesion is noted on this  unenhanced scan. Periventricular white matter decreased attenuation probable due to chronic small vessel ischemic changes.  IMPRESSION: No acute intracranial abnormality. Mild cerebral atrophy. Periventricular white matter decreased attenuation probable due to chronic small vessel ischemic changes.   Electronically Signed   By: Lahoma Crocker M.D.   On: 09/12/2013 16:13   Nm Hepatobiliary  09/12/2013   CLINICAL DATA:  Abdominal pain and possible cholecystitis  EXAM: NUCLEAR MEDICINE HEPATOBILIARY IMAGING  TECHNIQUE: Sequential images of the abdomen were obtained out to 60 minutes following intravenous administration of radiopharmaceutical.  RADIOPHARMACEUTICALS:  5 [place] [id number] Choletec  COMPARISON:  Abdominal ultrasound of September 11, 2013.  FINDINGS: There is adequate radiopharmaceutical uptake by the liver. The common bile duct is visible by 25 min as is the gallbladder. Bowel activity becomes visible by approximately 90 min.  IMPRESSION: Normal hepatobiliary scan.  Abdominal pain, possible Select proper NMHepatobiliary template.   Electronically Signed   By: David  Martinique   On: 09/12/2013 12:49   Ct Abdomen Pelvis W Contrast  09/11/2013   CLINICAL DATA:  Abdominal pain, elevated LFTs, mildly compromised renal function  EXAM: CT ABDOMEN AND PELVIS WITH CONTRAST  TECHNIQUE: Multidetector CT imaging of the abdomen and pelvis was performed using the standard protocol following bolus administration of intravenous contrast.  CONTRAST:  64mL OMNIPAQUE IOHEXOL 300 MG/ML  SOLN  COMPARISON:  Abdominal ultrasound of today's date  FINDINGS: The stomach is moderately distended with the oral contrast as well as with gas. The liver exhibits no focal mass or ductal dilation. The gallbladder is adequately distended now with no evidence of stones. The pancreas exhibits no focal mass or definite inflammatory change. The spleen, adrenal glands, and kidneys exhibit no acute abnormalities. There is stable hypodensity in the  lower pole of the left kidney which exhibits HU value of 17 most compatible with a cyst. A 1 cm hypodensity in the midpole of the right kidney exhibits HU value of 1.  The contrast has traversed much of the small bowel but has not yet reached the colon. There is no evidence of ileus or obstruction or acute inflammation. There is no inguinal hernia. There is a umbilical hernia containing fat.  The caliber of the abdominal aorta is normal. The small and large  bowel exhibit no evidence of ileus, obstruction, or acute inflammation. There is no ascites. The urinary bladder, prostate gland, and seminal vesicles are normal. The lumbar spine and bony pelvis exhibit no acute abnormalities. There are degenerative disc changes at L5-S1. The lung bases exhibit increased densities posteriorly, bilaterally consistent with atelectasis or pneumonia.  IMPRESSION: 1. Bibasilar atelectasis or pneumonia. 2. The gallbladder is visualized and appears normal. There is no acute hepatobiliary abnormality. 3. There is no acute urinary tract or bowel abnormality.   Electronically Signed   By: David  Martinique   On: 09/11/2013 19:23   US Abdomen Limited  09/15/2013   CLINICAL DATA:  Re-evaluate gallbladder.  EXAM: US ABDOMEN LIMITED - RIGHT UPPER QUADRANT  COMPARISON:  CT 09/11/2013.  Ultrasound the same day.  FINDINGS: Gallbladder:  No gallstones or wall thickening visualized. No sonographic Murphy sign noted.  Common bile duct:  Diameter: Normal caliber, 5 mm.  Liver:  No focal lesion identified. Within normal limits in parenchymal echogenicity.  IMPRESSION: Gallbladder appears unremarkable on today's study. No evidence of cholelithiasis or acute cholecystitis.   Electronically Signed   By: Rolm Baptise M.D.   On: 09/15/2013 11:27   US Abdomen Limited  09/11/2013   CLINICAL DATA:  68 year old female with abdominal pain, right upper quadrant pain, abnormal LFTs. Initial encounter.  EXAM: US ABDOMEN LIMITED - RIGHT UPPER QUADRANT   COMPARISON:  CT Abdomen and Pelvis 01/30/2009.  FINDINGS: Gallbladder:  Not visualized, presumably contracted.  Common bile duct:  Diameter: 6 mm, normal.  Liver:  No focal lesion identified. Within normal limits in parenchymal echogenicity.  IMPRESSION: 1. Gallbladder not identified, presumably contracted. 2. Negative sonographic appearance of the liver and CBD.   Electronically Signed   By: Lars Pinks M.D.   On: 09/11/2013 02:18   Dg Abd Acute W/chest  09/11/2013   CLINICAL DATA:  Mid chest and lower abdominal pain  EXAM: ACUTE ABDOMEN SERIES (ABDOMEN 2 VIEW & CHEST 1 VIEW)  COMPARISON:  Prior radiograph from 04/02/2013  FINDINGS: Borderline cardiomegaly is stable as compared to prior study. Mediastinal silhouette within normal limits. Mild tortuosity of the intrathoracic aorta noted.  Lung volumes within normal limits. There is mild perihilar vascular congestion without overt pulmonary edema. No focal infiltrate or pleural effusion. No pneumothorax.  Visualized bowel gas pattern is within normal limits without evidence of obstruction or ileus. No abnormal bowel wall thickening. No free intraperitoneal air.  No soft tissue mass or abnormal calcification.  No acute osseus abnormality.  IMPRESSION: 1. Nonobstructive bowel gas pattern with no radiographic evidence acute intra-abdominal abnormality. 2. Borderline cardiomegaly with mild perihilar vascular congestion without overt pulmonary edema.   Electronically Signed   By: Jeannine Boga M.D.   On: 09/11/2013 01:11   Dg Abd Portable 1v  09/13/2013   CLINICAL DATA:  Abdominal pain.  EXAM: PORTABLE ABDOMEN - 1 VIEW  COMPARISON:  CT scan 09/11/2013.  FINDINGS: There is some persistent scattered contrast throughout the colon from the recent CT scan. No findings for small bowel obstruction or obvious free air but examination is limited by body habitus.  IMPRESSION: No findings for small bowel obstruction.   Electronically Signed   By: Kalman Jewels M.D.    On: 09/13/2013 08:51    CBC  Recent Labs Lab 09/10/13 2359 09/11/13 0900 09/12/13 0505 09/13/13 0534 09/14/13 0715  WBC 13.4* 11.8* 10.0 7.8 7.8  HGB 9.6* 8.9* 8.0* 8.3* 8.0*  HCT 29.4* 27.9* 25.1* 26.9* 25.3*  PLT 313  269 234 260 240  MCV 71.2* 73.0* 73.4* 75.4* 74.4*  MCH 23.2* 23.3* 23.4* 23.2* 23.5*  MCHC 32.7 31.9 31.9 30.9 31.6  RDW 14.6 14.9 15.1 15.5 15.3  LYMPHSABS 0.5* 1.8  --   --   --   MONOABS 0.8 0.6  --   --   --   EOSABS 0.0 0.0  --   --   --   BASOSABS 0.0 0.0  --   --   --     Chemistries   Recent Labs Lab 09/11/13 0900 09/12/13 0505 09/13/13 0534 09/14/13 0715 09/15/13 0605  NA 145 141 141 145 142  K 4.5 5.8* 4.3 4.2 3.9  CL 105 104 105 108 105  CO2 27 25 23 23 23   GLUCOSE 200* 133* 124* 136* 164*  BUN 28* 33* 32* 38* 36*  CREATININE 1.64* 3.53* 2.96* 3.27* 2.63*  CALCIUM 8.7 8.0* 7.9* 8.1* 8.5  AST 880* 267* 102* 41* 23  ALT 392* 238* 160* 100* 77*  ALKPHOS 246* 180* 161* 137* 140*  BILITOT 0.9 0.4 0.4 0.4 0.4   ------------------------------------------------------------------------------------------------------------------ estimated creatinine clearance is 27.7 ml/min (by C-G formula based on Cr of 2.63). ------------------------------------------------------------------------------------------------------------------ No results found for this basename: HGBA1C,  in the last 72 hours ------------------------------------------------------------------------------------------------------------------ No results found for this basename: CHOL, HDL, LDLCALC, TRIG, CHOLHDL, LDLDIRECT,  in the last 72 hours ------------------------------------------------------------------------------------------------------------------ No results found for this basename: TSH, T4TOTAL, FREET3, T3FREE, THYROIDAB,  in the last 72 hours ------------------------------------------------------------------------------------------------------------------  Recent Labs   09/12/13 1530  VITAMINB12 1370*  FOLATE >20.0  FERRITIN 189  TIBC 199*  IRON 37*  RETICCTPCT 1.7    Coagulation profile  Recent Labs Lab 09/13/13 1130  INR 1.04    No results found for this basename: DDIMER,  in the last 72 hours  Cardiac Enzymes No results found for this basename: CK, CKMB, TROPONINI, MYOGLOBIN,  in the last 168 hours ------------------------------------------------------------------------------------------------------------------ No components found with this basename: POCBNP,     Anelia Carriveau D.O. on 09/15/2013 at 1:16 PM  Between 7am to 7pm - Pager - [phone]  After 7pm go to [email or web address] - password [email or web address]  And look for the night coverage person covering for me after hours  [facility] Office  [phone]

## 2013-09-15 NOTE — Progress Notes (Signed)
     [facility] Progress Note  Subjective:  Still complaining of abdominal pain, nausea, and vomiting.  Objective:  Vital signs in last 24 hours: Temp:  [98.6 F (37 C)-99 F (37.2 C)] 98.7 F (37.1 C) (08/26 0557) Pulse Rate:  [81-94] 85 (08/26 0557) Resp:  [16-18] 16 (08/26 0557) BP: (147-165)/(62-69) 165/69 mmHg (08/26 0557) SpO2:  [95 %-97 %] 95 % (08/26 0557) Weight:  [290 lb 6.4 oz (131.725 kg)-291 lb (131.997 kg)] 291 lb (131.997 kg) (08/26 0640) Last BM Date: 09/14/13 General:  Alert, Well-developed, in NAD Heart:  Regular rate and rhythm; SEM noted. Pulm:  CTAB.  No W/R/R. Abdomen:  Soft, non-distended.  BS present.  Diffuse TTP > in the upper abdomen without R/R/G.   Extremities:  Without edema. Neurologic:  Alert and  oriented x4;  grossly normal neurologically.  Intake/Output from previous day: 08/25 0701 - 08/26 0700 In: 1845 [P.O.:120; I.V.:1675; IV Piggyback:50] Out: [id number] [Urine:1075]  Lab Results:  Recent Labs  09/13/13 0534 09/14/13 0715  WBC 7.8 7.8  HGB 8.3* 8.0*  HCT 26.9* 25.3*  PLT 260 240   BMET  Recent Labs  09/13/13 0534 09/14/13 0715 09/15/13 0605  NA 141 145 142  K 4.3 4.2 3.9  CL 105 108 105  CO2 23 23 23   GLUCOSE 124* 136* 164*  BUN 32* 38* 36*  CREATININE 2.96* 3.27* 2.63*  CALCIUM 7.9* 8.1* 8.5   LFT  Recent Labs  09/15/13 0605  PROT 6.8  ALBUMIN 2.8*  AST 23  ALT 77*  ALKPHOS 140*  BILITOT 0.4   PT/INR  Recent Labs  09/13/13 1130  LABPROT 13.6  INR 1.04   Hepatitis Panel  Recent Labs  09/13/13 1130  HEPBSAG NEGATIVE  HCVAB NEGATIVE  HEPAIGM NON REACTIVE  HEPBIGM NON REACTIVE   Assessment / Plan: -68 year old female with RUQ abdominal pain and elevated LFT's: No evidence of cholecystitis by extensive imaging. ? Acute hepatitis of unknown etiology; possibly viral. LFT's continuing to trend down.  -Anemia: Appears to be anemia of chronic disease according to iron studies. Likely due to  CKD. Hgb is stable.  -Acute of chronic kidney injury: Cr improved some today.  Renal following.   *Acute viral hepatitis panel and EBV and CMV IgM are all negative. IgG, ANA, and AMA negative/normal.  *Await ASMA.  *Monitor Hgb and trend LFT's.  *Continue NPO for now. *Will re-check abdominal ultrasound for re-evaluation of gallbladder.    LOS: 5 days   ZEHR, JESSICA D.  09/15/2013, 8:54 AM  Pager number [phone]   ________________________________________________________________________  [facility] Heckler [facility] MD note:  I personally examined the patient, reviewed the data and agree with the assessment and plan described above.  The repeat US was normal.  She is feeling a bit better today, wants to resume eating. Will start clears and advance as tolerated. Etiology of her acute illness still not clear, I'm most suspicious of a viral illness however (viral gastroenteritis).   Owens Loffler, MD [facility] Pager [phone]

## 2013-09-16 DIAGNOSIS — J449 Chronic obstructive pulmonary disease, unspecified: Secondary | ICD-10-CM

## 2013-09-16 DIAGNOSIS — R748 Abnormal levels of other serum enzymes: Secondary | ICD-10-CM

## 2013-09-16 DIAGNOSIS — D509 Iron deficiency anemia, unspecified: Secondary | ICD-10-CM

## 2013-09-16 DIAGNOSIS — R1013 Epigastric pain: Secondary | ICD-10-CM

## 2013-09-16 LAB — GLUCOSE, CAPILLARY
GLUCOSE-CAPILLARY: 200 mg/dL — AB (ref 70–99)
Glucose-Capillary: 126 mg/dL — ABNORMAL HIGH (ref 70–99)
Glucose-Capillary: 153 mg/dL — ABNORMAL HIGH (ref 70–99)
Glucose-Capillary: 219 mg/dL — ABNORMAL HIGH (ref 70–99)

## 2013-09-16 LAB — CBC
HCT: 24 % — ABNORMAL LOW (ref 36.0–46.0)
Hemoglobin: 7.7 g/dL — ABNORMAL LOW (ref 12.0–15.0)
MCH: 23.4 pg — ABNORMAL LOW (ref 26.0–34.0)
MCHC: 32.1 g/dL (ref 30.0–36.0)
MCV: 72.9 fL — AB (ref 78.0–100.0)
PLATELETS: 219 10*3/uL (ref 150–400)
RBC: 3.29 MIL/uL — AB (ref 3.87–5.11)
RDW: 14.8 % (ref 11.5–15.5)
WBC: 6.2 10*3/uL (ref 4.0–10.5)

## 2013-09-16 LAB — BASIC METABOLIC PANEL
ANION GAP: 12 (ref 5–15)
BUN: 33 mg/dL — ABNORMAL HIGH (ref 6–23)
CHLORIDE: 103 meq/L (ref 96–112)
CO2: 25 mEq/L (ref 19–32)
Calcium: 8.4 mg/dL (ref 8.4–10.5)
Creatinine, Ser: 2.21 mg/dL — ABNORMAL HIGH (ref 0.50–1.10)
GFR calc Af Amer: 25 mL/min — ABNORMAL LOW (ref >90)
GFR calc non Af Amer: 22 mL/min — ABNORMAL LOW (ref >90)
Glucose, Bld: 130 mg/dL — ABNORMAL HIGH (ref 70–99)
Potassium: 3.9 mEq/L (ref 3.7–5.3)
Sodium: 140 mEq/L (ref 137–147)

## 2013-09-16 LAB — TROPONIN I: Troponin I: <0.3 ng/mL (ref <0.30)

## 2013-09-16 MED ORDER — FUROSEMIDE 10 MG/ML IJ SOLN
40.0000 mg | Freq: Once | INTRAMUSCULAR | Status: AC
  Administered 2013-09-16: 40 mg via INTRAVENOUS
  Filled 2013-09-16: qty 4

## 2013-09-16 MED ORDER — ONDANSETRON HCL 4 MG PO TABS
4.0000 mg | ORAL_TABLET | Freq: Three times a day (TID) | ORAL | Status: DC
  Administered 2013-09-16 – 2013-09-18 (×7): 4 mg via ORAL
  Filled 2013-09-16 (×11): qty 1

## 2013-09-16 MED ORDER — PANTOPRAZOLE SODIUM 40 MG PO TBEC
40.0000 mg | DELAYED_RELEASE_TABLET | Freq: Every day | ORAL | Status: DC
  Administered 2013-09-16 – 2013-09-18 (×3): 40 mg via ORAL
  Filled 2013-09-16 (×2): qty 1

## 2013-09-16 NOTE — Progress Notes (Signed)
Patient ID: Joann Gutierrez, female   DOB: 06/28/1945, 68 y.o.   MRN: [id number]  [facility] Progress Note    Assessment/ Plan:   1. Acute renal failure on chronic kidney disease stage III: Appears to be from contrast-induced nephropathy and possibly hemodynamic/ischemic ATN from relative hypotension. Improving renal function following intravenous fluids-at this time given evidence of volume overload, will discontinue intravenous fluids and give her a dose of Lasix as it appears that we may have tipped her over into some pulmonary edema. Continue to monitor renal function/urine output closely. 2. Abdominal pain/elevated LFTs: Ongoing evaluation by gastroenterology- all studies to inquire into viral/autoimmune hepatitis so far negative 3. Hypernatremia: Improved status post hypotonic intravenous fluids-furosemide today and continue to monitor. 4. Hypertension: Continue to monitor on current therapy (ARB on hold). We'll give her furosemide  Subjective:   Reports that she does not feel well this morning with some chest pain and shortness of breath. EKG reviewed and does not show any acute ST elevation    Objective:   BP 148/58  Pulse 74  Temp(Src) 98.6 F (37 C) (Oral)  Resp 18  Ht 5\' 4"  (1.626 m)  Wt 130.953 kg (288 lb 11.2 oz)  BMI 49.53 kg/m2  SpO2 98%  Intake/Output Summary (Last 24 hours) at 09/16/13 [id number] Last data filed at 09/15/13 [id number]  Gross per 24 hour  Intake   1416 ml  Output    700 ml  Net    716 ml   Weight change: -0.771 kg (-1 lb 11.2 oz)  Physical Exam: Gen: Appears uncomfortable resting in bed CVS: Pulse regular in rate and rhythm, S1 and S2 normal Resp: Fine rales audible bilaterally-lower one third of both lung fields Abd: Soft, obese,  Tender primarily over the upper quadrants Ext: Trace to 1+ lower extremity edema  Imaging: US Abdomen Limited  09/15/2013   CLINICAL DATA:  Re-evaluate gallbladder.  EXAM: US ABDOMEN LIMITED - RIGHT UPPER  QUADRANT  COMPARISON:  CT 09/11/2013.  Ultrasound the same day.  FINDINGS: Gallbladder:  No gallstones or wall thickening visualized. No sonographic Murphy sign noted.  Common bile duct:  Diameter: Normal caliber, 5 mm.  Liver:  No focal lesion identified. Within normal limits in parenchymal echogenicity.  IMPRESSION: Gallbladder appears unremarkable on today's study. No evidence of cholelithiasis or acute cholecystitis.   Electronically Signed   By: Rolm Baptise M.D.   On: 09/15/2013 11:27    Labs: BMET  Recent Labs Lab 09/10/13 2359 09/11/13 0900 09/12/13 0505 09/13/13 0534 09/14/13 0715 09/15/13 0605 09/16/13 0550  NA 143 145 141 141 145 142 140  K 4.2 4.5 5.8* 4.3 4.2 3.9 3.9  CL 103 105 104 105 108 105 103  CO2 27 27 25 23 23 23 25   GLUCOSE 277* 200* 133* 124* 136* 164* 130*  BUN 27* 28* 33* 32* 38* 36* 33*  CREATININE 1.61* 1.64* 3.53* 2.96* 3.27* 2.63* 2.21*  CALCIUM 9.0 8.7 8.0* 7.9* 8.1* 8.5 8.4   CBC  Recent Labs Lab 09/10/13 2359 09/11/13 0900 09/12/13 0505 09/13/13 0534 09/14/13 0715 09/16/13 0550  WBC 13.4* 11.8* 10.0 7.8 7.8 6.2  NEUTROABS 12.1* 9.3*  --   --   --   --   HGB 9.6* 8.9* 8.0* 8.3* 8.0* 7.7*  HCT 29.4* 27.9* 25.1* 26.9* 25.3* 24.0*  MCV 71.2* 73.0* 73.4* 75.4* 74.4* 72.9*  PLT 313 269 234 260 240 219    Medications:    . atorvastatin  40 mg Oral  Daily  . budesonide-formoterol  2 puff Inhalation BID  . cefTRIAXone (ROCEPHIN)  IV  1 g Intravenous Q24H  . citalopram  20 mg Oral Daily  . diltiazem  240 mg Oral Daily  . donepezil  10 mg Oral QHS  . ezetimibe  10 mg Oral Daily  . fluticasone  2 spray Each Nare Daily  . gabapentin  300 mg Oral Daily  . heparin subcutaneous  5,000 Units Subcutaneous 3 times per day  . hydrALAZINE  100 mg Oral 3 times per day  . insulin aspart  0-15 Units Subcutaneous TID WC  . insulin glargine  15 Units Subcutaneous Daily  . isosorbide mononitrate  120 mg Oral Daily  . montelukast  10 mg Oral Daily  .  ondansetron  4 mg Oral 3 times per day  . pantoprazole (PROTONIX) IV  40 mg Intravenous QHS  . raloxifene  60 mg Oral Daily   [name], MD 09/16/2013, 9:25 AM

## 2013-09-16 NOTE — Progress Notes (Signed)
[facility] Progress Note  Subjective:  Every day she says "I don't feel so good today".  Says that she ate some liquids yesterday afternoon/evening, but vomited some clear stuff this AM.  Abdominal pain still present but maybe a little better.  Had a brown BM yesterday.  Had a regular breakfast sitting in front of her during my visit.  Complained of some chest pain and SOB this AM so troponin was ordered by medicine service.  Objective:  Vital signs in last 24 hours: Temp:  [98.1 F (36.7 C)-98.6 F (37 C)] 98.6 F (37 C) (08/27 0443) Pulse Rate:  [74-92] 74 (08/27 0443) Resp:  [17-18] 18 (08/27 0443) BP: (129-165)/(44-58) 148/58 mmHg (08/27 0443) SpO2:  [94 %-98 %] 98 % (08/27 0443) Weight:  [288 lb 11.2 oz (130.953 kg)] 288 lb 11.2 oz (130.953 kg) (08/27 0443) Last BM Date: 09/15/13 General:  Alert, Well-developed, in NAD Heart:  Regular rate and rhythm; SEM noted Pulm:  CTAB.  No W/R/R. Abdomen:  Soft, non-distended. Normal bowel sounds.  Still with upper abdominal TTP but seems to be less than yesterday.  Extremities:  Without edema. Neurologic:  Alert and  oriented x4;  grossly normal neurologically. Psych:  Alert and cooperative. Normal mood and affect  Output from previous day: 08/26 0701 - 08/27 0700 In: 1416 [P.O.:516; I.V.:850; IV Piggyback:50] Out: 700 [Urine:700]  Lab Results:  Recent Labs  09/14/13 0715 09/16/13 0550  WBC 7.8 6.2  HGB 8.0* 7.7*  HCT 25.3* 24.0*  PLT 240 219   BMET  Recent Labs  09/14/13 0715 09/15/13 0605 09/16/13 0550  NA 145 142 140  K 4.2 3.9 3.9  CL 108 105 103  CO2 23 23 25   GLUCOSE 136* 164* 130*  BUN 38* 36* 33*  CREATININE 3.27* 2.63* 2.21*  CALCIUM 8.1* 8.5 8.4   LFT  Recent Labs  09/15/13 0605  PROT 6.8  ALBUMIN 2.8*  AST 23  ALT 77*  ALKPHOS 140*  BILITOT 0.4   PT/INR  Recent Labs  09/13/13 1130  LABPROT 13.6  INR 1.04   Hepatitis Panel  Recent Labs  09/13/13 1130  HEPBSAG  NEGATIVE  HCVAB NEGATIVE  HEPAIGM NON REACTIVE  HEPBIGM NON REACTIVE    US Abdomen Limited  09/15/2013   CLINICAL DATA:  Re-evaluate gallbladder.  EXAM: US ABDOMEN LIMITED - RIGHT UPPER QUADRANT  COMPARISON:  CT 09/11/2013.  Ultrasound the same day.  FINDINGS: Gallbladder:  No gallstones or wall thickening visualized. No sonographic Murphy sign noted.  Common bile duct:  Diameter: Normal caliber, 5 mm.  Liver:  No focal lesion identified. Within normal limits in parenchymal echogenicity.  IMPRESSION: Gallbladder appears unremarkable on today's study. No evidence of cholelithiasis or acute cholecystitis.   Electronically Signed   By: Rolm Baptise M.D.   On: 09/15/2013 11:27    Assessment / Plan: -68 year old female with RUQ abdominal pain and elevated LFT's: No evidence of cholecystitis by extensive imaging. ? Acute hepatitis of unknown etiology; possibly viral. LFT's trending down.  -Anemia: Appears to be anemia of chronic disease according to iron studies. Likely due to CKD. Hgb is fairly stable.  -Acute of chronic kidney injury: Cr improved some again today. Renal following.   *Acute viral hepatitis panel and EBV and CMV IgM are all negative. IgG, ANA, ASMA, and AMA negative/normal.  *Monitor Hgb and trend LFT's.  *Decision to resume statin per Dr. Ardis Hughs.    LOS: 6 days   ZEHR,  JESSICA D.  09/16/2013, 9:08 AM  Pager number [phone]   ________________________________________________________________________  [facility] MD note:  I personally examined the patient, reviewed the data and agree with the assessment and plan described above.  Unclear what caused her acute illness.  Her LFTs were quite high at admission, have trended down nicely and all liver, biliary imaging has been essentially normal.  Viral, autoimmune liver workup also all negative.  She is eating better (at BF without n/v or abd pains this morning).  Should continue encouraging her to eat and ambulate (at least TID with  assist, she hasn't gotten out of bed, chair in several days).    Owens Loffler, MD [facility] Pager [phone]

## 2013-09-16 NOTE — Progress Notes (Signed)
[facility]                                                                              Patient Demographics  Joann Gutierrez, is a 68 y.o. female, DOB - 1945/05/06, [id number]  Admit date - 09/10/2013   Admitting Physician Cristal Ford, DO  Outpatient Primary MD for the patient is Kandice Hams, MD  LOS - 6   Chief Complaint  Patient presents with  . Abdominal Pain      HPI/Interim history 68 y.o. female with PMH of depression, DM 2, HTN, HLD, CAD, history of CVA, asthma/COPD, renal insufficiency/CKD stage 3, CHF, history of seizures. She presented to [facility] hospital on 8/21 with complaints of RUQ abdominal pain and SOB that started a day or two prior to admission. Pain was worsening and radiated to her back. She had some nausea but no vomiting. Surgery admitted the patient for possible biliary colic. Ultrasound did not visualize the gallbladder and it was presumably contracted. Liver and CBD appeared normal. CT scan of the abdomen and pelvis was then performed and showed normal appearing gallbladder with no other acute abnormalities identified. HIDA was also subsequently normal. Upon presentation, LFTs were extremely elevated at AST 984, ALT 310, ALP 254, normal total bili. LFT's have trended down since her hospitalization and are currently AST 102, ALT 160, ALP 161, and total bili still remains normal. Denied ETOH use, extensive tylenol or NSAID use. No recent travel or sick contacts to her knowledge.   In her GI history, she has undergone an EGD on 04/08/2013 by Dr. Deatra Ina for complaints of dysphagia. There was no obvious stricture but she was dilated with Dr. Venia Minks (had some resistance). She does have a history of an esophageal stricture in 02/2012 that was dilated during EGD as well. Colonoscopy 11/2010 she had two polyps removed (TA's) and diverticulosis; recall colonoscopy was entered for 5 years.  She continued to have N/V. LFT's trending down. Hepatitis panel negative.  GI following. EBV negative, CMV negative, ANA pending, WBC remains normal. Serum creatinine is up again today at 3.27. At this point, she does not have a surgical problem and needs to be medically managed.   Assessment & Plan   Acute on chronic kidney disease, stage III -Baseline creatinine appears to be 1.2-1.5, creatinine peaked at 3.53 -Creatinine trending downward -Nephrology consulted and appreciated -ARB and metolazone and Lasix currently held -Continue IV fluids -Possibly secondary to contrast-induced nephropathy as well as poor oral intake prior to admission as well as outpatient medications -No hydronephrosis on CT scan -Patient has good urine output  Hyperkalemia -Resolved  Hypertension -ARB, metolazone and Lasix currently held  Diabetes mellitus type 2 -Continue insulin sliding scale and Lantus with CBG monitoring  Coronary artery disease -Currently stable, patient is chest pain-free -Patient has been seen as an outpatient for chest pain, she had a cath in the 02/2013 revealing minimal disease  COPD -Currently stable  Chronic diastolic heart failure -Currently compensated, EF 70% (cath in February 2015) -Lasix currently held due to to acute kidney injury -Continue to monitor daily weights, intake and output  Biliary obstruction with transaminitis -Appears to be improving -Gastroenterology as well as general surgery  consulted and appreciated -Hepatitis panel negative,?viral hepatitis -CMV, EBV negative -IgG, ANA, AMA negative normal -CT of the abdomen showed normal appearance of gallbladder, normal urinary tract -Patient started on clear liquid diet and tolerated it well, will advance diet to full liquid -Continue antiemetics for nausea, but will also order scheduled -GI believes this may be viral gastroenteritis -Surgery signed off  Microcytic Anemia -Appears to be chronic, possibly secondary to chronic disease -Hemoglobin appears to be stable at this  point -Continue to monitor CBC  Chest pain, likely musculoskeletal -Will obtain troponin and EKG -Reproducible with palpation  Code Status: Full  Family Communication: None at bedside  Disposition Plan: Admitted  Time Spent in minutes   25 minutes  Procedures  none  Consults   Nephrology Gastroenterology General surgery  DVT Prophylaxis  Heparin  Lab Results  Component Value Date   PLT 219 09/16/2013    Medications  Scheduled Meds: . atorvastatin  40 mg Oral Daily  . budesonide-formoterol  2 puff Inhalation BID  . cefTRIAXone (ROCEPHIN)  IV  1 g Intravenous Q24H  . citalopram  20 mg Oral Daily  . diltiazem  240 mg Oral Daily  . donepezil  10 mg Oral QHS  . ezetimibe  10 mg Oral Daily  . fluticasone  2 spray Each Nare Daily  . gabapentin  300 mg Oral Daily  . heparin subcutaneous  5,000 Units Subcutaneous 3 times per day  . hydrALAZINE  100 mg Oral 3 times per day  . insulin aspart  0-15 Units Subcutaneous TID WC  . insulin glargine  15 Units Subcutaneous Daily  . isosorbide mononitrate  120 mg Oral Daily  . montelukast  10 mg Oral Daily  . ondansetron  4 mg Oral 3 times per day  . pantoprazole  40 mg Oral Daily  . raloxifene  60 mg Oral Daily   Continuous Infusions:   PRN Meds:.acetaminophen, albuterol, metoCLOPramide (REGLAN) injection, morphine injection, nitroGLYCERIN, ondansetron, sodium chloride  Antibiotics    Anti-infectives   Start     Dose/Rate Route Frequency Ordered Stop   09/14/13 0900  cefTRIAXone (ROCEPHIN) 1 g in dextrose 5 % 50 mL IVPB     1 g 100 mL/hr over 30 Minutes Intravenous Every 24 hours 09/14/13 [id number]        Subjective:   Joann Gutierrez seen and examined today.  Patient continues to complain of abdominal pain but improving.  She continues to have nausea and vomiting. She complains of pain and soreness in her legs and chest.   Objective:   Filed Vitals:   09/15/13 2035 09/15/13 2100 09/16/13 0443 09/16/13 0955  BP:   129/44 148/58   Pulse:  80 74   Temp:  98.5 F (36.9 C) 98.6 F (37 C)   TempSrc:  Oral    Resp:  18 18   Height:      Weight:   130.953 kg (288 lb 11.2 oz)   SpO2: 94% 94% 98% 97%    Wt Readings from Last 3 Encounters:  09/16/13 130.953 kg (288 lb 11.2 oz)  06/02/13 122.471 kg (270 lb)  05/05/13 121.564 kg (268 lb)     Intake/Output Summary (Last 24 hours) at 09/16/13 1107 Last data filed at 09/16/13 0930  Gross per 24 hour  Intake   1534 ml  Output   1300 ml  Net    234 ml    Exam  General: Well developed, well nourished, NAD, appears stated age  62: NCAT, mucous  membranes moist.   Cardiovascular: S1 S2 auscultated, 3/6 SEM. Regular rate and rhythm.  Chest wall: pain with palpation  Respiratory: Clear to auscultation bilaterally with equal chest rise  Abdomen: Soft, diffusely tender, nondistended, + bowel sounds  Extremities: warm dry without cyanosis clubbing or edema, tenderness to palpation of the LE B/L  Neuro: AAOx3, no focal deficits   Skin: Without rashes exudates or nodules  Psych: Flat affect and mood  Data Review   Micro Results Recent Results (from the past 240 hour(s))  URINE CULTURE     Status: None   Collection Time    09/14/13 11:57 AM      Result Value Ref Range Status   Specimen Description URINE, CATHETERIZED   Final   Special Requests NONE   Final   Culture  Setup Time     Final   Value: 09/14/2013 12:25     Performed at [facility]     Final   Value: 70,000 COLONIES/ML     Performed at [facility]   Culture     Final   Value: YEAST     Performed at [facility]   Report Status 09/15/2013 FINAL   Final    Radiology Reports Dg Chest 1 View  09/11/2013   CLINICAL DATA:  Difficulty breathing and chest pain  EXAM: CHEST - 1 VIEW  COMPARISON:  September 11, 2013  FINDINGS: Degree of inspiration is shallow. There is no edema or consolidation. Heart is upper normal in size with pulmonary  vascularity within normal limits. No pneumothorax. No adenopathy. No bone lesions.  IMPRESSION: Shallow degree of inspiration.  No edema or consolidation.   Electronically Signed   By: Lowella Grip M.D.   On: 09/11/2013 19:20   Ct Head Wo Contrast  09/12/2013   CLINICAL DATA:  Headache, COPD  EXAM: CT HEAD WITHOUT CONTRAST  TECHNIQUE: Contiguous axial images were obtained from the base of the skull through the vertex without intravenous contrast.  COMPARISON:  08/18/1998  FINDINGS: No skull fracture is noted. Paranasal sinuses and mastoid air cells are unremarkable. No intracranial hemorrhage, mass effect or midline shift. Mild cerebral atrophy. No acute cortical infarction. No mass lesion is noted on this unenhanced scan. Periventricular white matter decreased attenuation probable due to chronic small vessel ischemic changes.  IMPRESSION: No acute intracranial abnormality. Mild cerebral atrophy. Periventricular white matter decreased attenuation probable due to chronic small vessel ischemic changes.   Electronically Signed   By: Lahoma Crocker M.D.   On: 09/12/2013 16:13   Nm Hepatobiliary  09/12/2013   CLINICAL DATA:  Abdominal pain and possible cholecystitis  EXAM: NUCLEAR MEDICINE HEPATOBILIARY IMAGING  TECHNIQUE: Sequential images of the abdomen were obtained out to 60 minutes following intravenous administration of radiopharmaceutical.  RADIOPHARMACEUTICALS:  5 [place] 123XX123 Choletec  COMPARISON:  Abdominal ultrasound of September 11, 2013.  FINDINGS: There is adequate radiopharmaceutical uptake by the liver. The common bile duct is visible by 25 min as is the gallbladder. Bowel activity becomes visible by approximately 90 min.  IMPRESSION: Normal hepatobiliary scan.  Abdominal pain, possible Select proper NMHepatobiliary template.   Electronically Signed   By: David  Martinique   On: 09/12/2013 12:49   Ct Abdomen Pelvis W Contrast  09/11/2013   CLINICAL DATA:  Abdominal pain, elevated LFTs, mildly  compromised renal function  EXAM: CT ABDOMEN AND PELVIS WITH CONTRAST  TECHNIQUE: Multidetector CT imaging of the abdomen and pelvis was performed using the  standard protocol following bolus administration of intravenous contrast.  CONTRAST:  10mL OMNIPAQUE IOHEXOL 300 MG/ML  SOLN  COMPARISON:  Abdominal ultrasound of today's date  FINDINGS: The stomach is moderately distended with the oral contrast as well as with gas. The liver exhibits no focal mass or ductal dilation. The gallbladder is adequately distended now with no evidence of stones. The pancreas exhibits no focal mass or definite inflammatory change. The spleen, adrenal glands, and kidneys exhibit no acute abnormalities. There is stable hypodensity in the lower pole of the left kidney which exhibits HU value of 17 most compatible with a cyst. A 1 cm hypodensity in the midpole of the right kidney exhibits HU value of 1.  The contrast has traversed much of the small bowel but has not yet reached the colon. There is no evidence of ileus or obstruction or acute inflammation. There is no inguinal hernia. There is a umbilical hernia containing fat.  The caliber of the abdominal aorta is normal. The small and large bowel exhibit no evidence of ileus, obstruction, or acute inflammation. There is no ascites. The urinary bladder, prostate gland, and seminal vesicles are normal. The lumbar spine and bony pelvis exhibit no acute abnormalities. There are degenerative disc changes at L5-S1. The lung bases exhibit increased densities posteriorly, bilaterally consistent with atelectasis or pneumonia.  IMPRESSION: 1. Bibasilar atelectasis or pneumonia. 2. The gallbladder is visualized and appears normal. There is no acute hepatobiliary abnormality. 3. There is no acute urinary tract or bowel abnormality.   Electronically Signed   By: David  Martinique   On: 09/11/2013 19:23   US Abdomen Limited  09/15/2013   CLINICAL DATA:  Re-evaluate gallbladder.  EXAM: US ABDOMEN LIMITED  - RIGHT UPPER QUADRANT  COMPARISON:  CT 09/11/2013.  Ultrasound the same day.  FINDINGS: Gallbladder:  No gallstones or wall thickening visualized. No sonographic Murphy sign noted.  Common bile duct:  Diameter: Normal caliber, 5 mm.  Liver:  No focal lesion identified. Within normal limits in parenchymal echogenicity.  IMPRESSION: Gallbladder appears unremarkable on today's study. No evidence of cholelithiasis or acute cholecystitis.   Electronically Signed   By: Rolm Baptise M.D.   On: 09/15/2013 11:27   US Abdomen Limited  09/11/2013   CLINICAL DATA:  68 year old female with abdominal pain, right upper quadrant pain, abnormal LFTs. Initial encounter.  EXAM: US ABDOMEN LIMITED - RIGHT UPPER QUADRANT  COMPARISON:  CT Abdomen and Pelvis 01/30/2009.  FINDINGS: Gallbladder:  Not visualized, presumably contracted.  Common bile duct:  Diameter: 6 mm, normal.  Liver:  No focal lesion identified. Within normal limits in parenchymal echogenicity.  IMPRESSION: 1. Gallbladder not identified, presumably contracted. 2. Negative sonographic appearance of the liver and CBD.   Electronically Signed   By: Lars Pinks M.D.   On: 09/11/2013 02:18   Dg Abd Acute W/chest  09/11/2013   CLINICAL DATA:  Mid chest and lower abdominal pain  EXAM: ACUTE ABDOMEN SERIES (ABDOMEN 2 VIEW & CHEST 1 VIEW)  COMPARISON:  Prior radiograph from 04/02/2013  FINDINGS: Borderline cardiomegaly is stable as compared to prior study. Mediastinal silhouette within normal limits. Mild tortuosity of the intrathoracic aorta noted.  Lung volumes within normal limits. There is mild perihilar vascular congestion without overt pulmonary edema. No focal infiltrate or pleural effusion. No pneumothorax.  Visualized bowel gas pattern is within normal limits without evidence of obstruction or ileus. No abnormal bowel wall thickening. No free intraperitoneal air.  No soft tissue mass or abnormal calcification.  No acute osseus abnormality.  IMPRESSION: 1.  Nonobstructive bowel gas pattern with no radiographic evidence acute intra-abdominal abnormality. 2. Borderline cardiomegaly with mild perihilar vascular congestion without overt pulmonary edema.   Electronically Signed   By: Jeannine Boga M.D.   On: 09/11/2013 01:11   Dg Abd Portable 1v  09/13/2013   CLINICAL DATA:  Abdominal pain.  EXAM: PORTABLE ABDOMEN - 1 VIEW  COMPARISON:  CT scan 09/11/2013.  FINDINGS: There is some persistent scattered contrast throughout the colon from the recent CT scan. No findings for small bowel obstruction or obvious free air but examination is limited by body habitus.  IMPRESSION: No findings for small bowel obstruction.   Electronically Signed   By: Kalman Jewels M.D.   On: 09/13/2013 08:51    CBC  Recent Labs Lab 09/10/13 2359 09/11/13 0900 09/12/13 0505 09/13/13 0534 09/14/13 0715 09/16/13 0550  WBC 13.4* 11.8* 10.0 7.8 7.8 6.2  HGB 9.6* 8.9* 8.0* 8.3* 8.0* 7.7*  HCT 29.4* 27.9* 25.1* 26.9* 25.3* 24.0*  PLT 313 269 234 260 240 219  MCV 71.2* 73.0* 73.4* 75.4* 74.4* 72.9*  MCH 23.2* 23.3* 23.4* 23.2* 23.5* 23.4*  MCHC 32.7 31.9 31.9 30.9 31.6 32.1  RDW 14.6 14.9 15.1 15.5 15.3 14.8  LYMPHSABS 0.5* 1.8  --   --   --   --   MONOABS 0.8 0.6  --   --   --   --   EOSABS 0.0 0.0  --   --   --   --   BASOSABS 0.0 0.0  --   --   --   --     Chemistries   Recent Labs Lab 09/11/13 0900 09/12/13 0505 09/13/13 0534 09/14/13 0715 09/15/13 0605 09/16/13 0550  NA 145 141 141 145 142 140  K 4.5 5.8* 4.3 4.2 3.9 3.9  CL 105 104 105 108 105 103  CO2 27 25 23 23 23 25   GLUCOSE 200* 133* 124* 136* 164* 130*  BUN 28* 33* 32* 38* 36* 33*  CREATININE 1.64* 3.53* 2.96* 3.27* 2.63* 2.21*  CALCIUM 8.7 8.0* 7.9* 8.1* 8.5 8.4  AST 880* 267* 102* 41* 23  --   ALT 392* 238* 160* 100* 77*  --   ALKPHOS 246* 180* 161* 137* 140*  --   BILITOT 0.9 0.4 0.4 0.4 0.4  --     ------------------------------------------------------------------------------------------------------------------ estimated creatinine clearance is 32.8 ml/min (by C-G formula based on Cr of 2.21). ------------------------------------------------------------------------------------------------------------------ No results found for this basename: HGBA1C,  in the last 72 hours ------------------------------------------------------------------------------------------------------------------ No results found for this basename: CHOL, HDL, LDLCALC, TRIG, CHOLHDL, LDLDIRECT,  in the last 72 hours ------------------------------------------------------------------------------------------------------------------ No results found for this basename: TSH, T4TOTAL, FREET3, T3FREE, THYROIDAB,  in the last 72 hours ------------------------------------------------------------------------------------------------------------------ No results found for this basename: VITAMINB12, FOLATE, FERRITIN, TIBC, IRON, RETICCTPCT,  in the last 72 hours  Coagulation profile  Recent Labs Lab 09/13/13 1130  INR 1.04    No results found for this basename: DDIMER,  in the last 72 hours  Cardiac Enzymes No results found for this basename: CK, CKMB, TROPONINI, MYOGLOBIN,  in the last 168 hours ------------------------------------------------------------------------------------------------------------------ No components found with this basename: POCBNP,     Joann Gutierrez D.O. on 09/16/2013 at 11:07 AM  Between 7am to 7pm - Pager - [phone]  After 7pm go to [email or web address] - password [email or web address]  And look for the night coverage person covering for me after hours  [facility] Office  [phone]

## 2013-09-16 NOTE — Progress Notes (Signed)
Pt complained of chest pain and difficulty breathing.  Pt stated she had experienced this type of pain before.  MD notified and orders received.   Pt received two doses of Nitro and one dose of morphine and pain was relieved.

## 2013-09-17 LAB — COMPREHENSIVE METABOLIC PANEL
ALT: 46 U/L — ABNORMAL HIGH (ref 0–35)
AST: 22 U/L (ref 0–37)
Albumin: 2.6 g/dL — ABNORMAL LOW (ref 3.5–5.2)
Alkaline Phosphatase: 126 U/L — ABNORMAL HIGH (ref 39–117)
Anion gap: 12 (ref 5–15)
BUN: 35 mg/dL — ABNORMAL HIGH (ref 6–23)
CO2: 25 meq/L (ref 19–32)
Calcium: 8.6 mg/dL (ref 8.4–10.5)
Chloride: 102 mEq/L (ref 96–112)
Creatinine, Ser: 2.32 mg/dL — ABNORMAL HIGH (ref 0.50–1.10)
GFR calc non Af Amer: 20 mL/min — ABNORMAL LOW (ref >90)
GFR, EST AFRICAN AMERICAN: 24 mL/min — AB (ref >90)
Glucose, Bld: 145 mg/dL — ABNORMAL HIGH (ref 70–99)
Potassium: 3.9 mEq/L (ref 3.7–5.3)
SODIUM: 139 meq/L (ref 137–147)
TOTAL PROTEIN: 6.4 g/dL (ref 6.0–8.3)
Total Bilirubin: 0.2 mg/dL — ABNORMAL LOW (ref 0.3–1.2)

## 2013-09-17 LAB — GLUCOSE, CAPILLARY
GLUCOSE-CAPILLARY: 142 mg/dL — AB (ref 70–99)
GLUCOSE-CAPILLARY: 162 mg/dL — AB (ref 70–99)
Glucose-Capillary: 219 mg/dL — ABNORMAL HIGH (ref 70–99)
Glucose-Capillary: 154 mg/dL — ABNORMAL HIGH (ref 70–99)
Glucose-Capillary: 117 mg/dL — ABNORMAL HIGH (ref 70–99)

## 2013-09-17 LAB — CBC
HCT: 23.2 % — ABNORMAL LOW (ref 36.0–46.0)
Hemoglobin: 7.5 g/dL — ABNORMAL LOW (ref 12.0–15.0)
MCH: 23.7 pg — ABNORMAL LOW (ref 26.0–34.0)
MCHC: 32.3 g/dL (ref 30.0–36.0)
MCV: 73.2 fL — ABNORMAL LOW (ref 78.0–100.0)
Platelets: 220 10*3/uL (ref 150–400)
RBC: 3.17 MIL/uL — AB (ref 3.87–5.11)
RDW: 15 % (ref 11.5–15.5)
WBC: 6.9 10*3/uL (ref 4.0–10.5)

## 2013-09-17 MED ORDER — DARBEPOETIN ALFA-POLYSORBATE 100 MCG/0.5ML IJ SOLN
100.0000 ug | INTRAMUSCULAR | Status: DC
  Administered 2013-09-17: 100 ug via SUBCUTANEOUS
  Filled 2013-09-17: qty 0.5

## 2013-09-17 MED ORDER — FUROSEMIDE 40 MG PO TABS
40.0000 mg | ORAL_TABLET | Freq: Two times a day (BID) | ORAL | Status: DC
  Administered 2013-09-17 – 2013-09-18 (×3): 40 mg via ORAL
  Filled 2013-09-17 (×4): qty 1

## 2013-09-17 MED ORDER — SODIUM CHLORIDE 0.9 % IV SOLN
1020.0000 mg | Freq: Once | INTRAVENOUS | Status: AC
  Administered 2013-09-17: 1020 mg via INTRAVENOUS
  Filled 2013-09-17: qty 34

## 2013-09-17 NOTE — Progress Notes (Signed)
Inpatient Diabetes Program Recommendations  AACE/ADA: New Consensus Statement on Inpatient Glycemic Control (2013)  Target Ranges:  Prepandial:   less than 140 mg/dL      Peak postprandial:   less than 180 mg/dL (1-2 hours)      Critically ill patients:  140 - 180 mg/dL     Results for Joann Gutierrez, Joann Gutierrez (MRN [id number]) as of 09/17/2013 09:28  Ref. Range 09/16/2013 07:53 09/16/2013 11:44 09/16/2013 17:13 09/16/2013 21:50  Glucose-Capillary Latest Range: 70-99 mg/dL 126 (H) 200 (H) 153 (H) 219 (H)    Results for Joann Gutierrez, Joann Gutierrez (MRN [id number]) as of 09/17/2013 09:28  Ref. Range 09/17/2013 08:26  Glucose-Capillary Latest Range: 70-99 mg/dL 142 (H)     Home DM Meds:  Lantus 30 units daily NPH insulin (2-13 units tid with meals per SSI)  Current DM Meds: Lantus 15 units daily Novolog Moderate SSI tid    **Starting solid PO diet today.    **Patient having slightly elevated postprandial glucose levels    MD- If patient tolerates solid PO diet, please consider adding low dose Novolog Meal Coverage- Novolog 3 units tid with meals    Will follow Wyn Quaker RN, MSN, CDE Diabetes Coordinator Inpatient Diabetes Program Team Pager: [phone] (8a-10p)

## 2013-09-17 NOTE — Progress Notes (Signed)
Patient ID: Joann Gutierrez, female   DOB: 05-12-1945, 68 y.o.   MRN: [id number]  [facility] Progress Note    Assessment/ Plan:   1. Acute renal failure on chronic kidney disease stage III: Appears to be from contrast-induced nephropathy and possibly hemodynamic/ischemic ATN from relative hypotension. Renal function sluggishly improving, will restart scheduled diuretics to promote UOP and avoid overload. 2. Abdominal pain/elevated LFTs: Ongoing evaluation by gastroenterology, noted to have improved/resolved- all studies to inquire into viral/autoimmune hepatitis so far negative 3. Hypernatremia: corrected status post hypotonic intravenous fluids-restart furosemide and continue to monitor. 4. Hypertension: Continue to monitor on current therapy (ARB on hold). Restart furosemide  Subjective:   Reports that she feels better today and was able to ambulate hallways with assistance   Objective:   BP 132/53  Pulse 68  Temp(Src) 98 F (36.7 C) (Oral)  Resp 17  Ht 5\' 4"  (1.626 m)  Wt 134.9 kg (297 lb 6.4 oz)  BMI 51.02 kg/m2  SpO2 96%  Intake/Output Summary (Last 24 hours) at 09/17/13 1026 Last data filed at 09/16/13 2100  Gross per 24 hour  Intake    360 ml  Output   1150 ml  Net   -790 ml   Weight change: 3.946 kg (8 lb 11.2 oz)  Physical Exam: Gen: Appears comfortable sitting in recliner eating breakfast [facility]: Pulse regular in rate and rhythm, S1 and S2 normal Resp: both lung fields clear to auscultation Abd: Soft, obese,  Tender primarily over the upper quadrants Ext: Trace to 1+ lower extremity edema  Imaging: US Abdomen Limited  09/15/2013   CLINICAL DATA:  Re-evaluate gallbladder.  EXAM: US ABDOMEN LIMITED - RIGHT UPPER QUADRANT  COMPARISON:  CT 09/11/2013.  Ultrasound the same day.  FINDINGS: Gallbladder:  No gallstones or wall thickening visualized. No sonographic Murphy sign noted.  Common bile duct:  Diameter: Normal caliber, 5 mm.  Liver:  No focal  lesion identified. Within normal limits in parenchymal echogenicity.  IMPRESSION: Gallbladder appears unremarkable on today's study. No evidence of cholelithiasis or acute cholecystitis.   Electronically Signed   By: Rolm Baptise M.D.   On: 09/15/2013 11:27    Labs: BMET  Recent Labs Lab 09/11/13 0900 09/12/13 0505 09/13/13 0534 09/14/13 0715 09/15/13 0605 09/16/13 0550 09/17/13 0554  NA 145 141 141 145 142 140 139  K 4.5 5.8* 4.3 4.2 3.9 3.9 3.9  CL 105 104 105 108 105 103 102  CO2 27 25 23 23 23 25 25   GLUCOSE 200* 133* 124* 136* 164* 130* 145*  BUN 28* 33* 32* 38* 36* 33* 35*  CREATININE 1.64* 3.53* 2.96* 3.27* 2.63* 2.21* 2.32*  CALCIUM 8.7 8.0* 7.9* 8.1* 8.5 8.4 8.6   CBC  Recent Labs Lab 09/10/13 2359 09/11/13 0900  09/13/13 0534 09/14/13 0715 09/16/13 0550 09/17/13 0554  WBC 13.4* 11.8*  < > 7.8 7.8 6.2 6.9  NEUTROABS 12.1* 9.3*  --   --   --   --   --   HGB 9.6* 8.9*  < > 8.3* 8.0* 7.7* 7.5*  HCT 29.4* 27.9*  < > 26.9* 25.3* 24.0* 23.2*  MCV 71.2* 73.0*  < > 75.4* 74.4* 72.9* 73.2*  PLT 313 269  < > 260 240 219 220  < > = values in this interval not displayed.  Medications:    . atorvastatin  40 mg Oral Daily  . budesonide-formoterol  2 puff Inhalation BID  . cefTRIAXone (ROCEPHIN)  IV  1 g Intravenous  Q24H  . citalopram  20 mg Oral Daily  . diltiazem  240 mg Oral Daily  . donepezil  10 mg Oral QHS  . ezetimibe  10 mg Oral Daily  . fluticasone  2 spray Each Nare Daily  . gabapentin  300 mg Oral Daily  . heparin subcutaneous  5,000 Units Subcutaneous 3 times per day  . hydrALAZINE  100 mg Oral 3 times per day  . insulin aspart  0-15 Units Subcutaneous TID WC  . insulin glargine  15 Units Subcutaneous Daily  . isosorbide mononitrate  120 mg Oral Daily  . montelukast  10 mg Oral Daily  . ondansetron  4 mg Oral 3 times per day  . pantoprazole  40 mg Oral Daily  . raloxifene  60 mg Oral Daily   [name], MD 09/17/2013, 10:26 AM

## 2013-09-17 NOTE — Progress Notes (Signed)
OT Cancellation Note  Patient Details Name: Joann Gutierrez MRN: [id number] DOB: 1945/05/13   Cancelled Treatment:    Reason Eval/Treat Not Completed: Fatigue/lethargy limiting ability to participate. Pt sleeping in recliner and attempted to rouse for OT session, however pt unable to remain alert. Will defer OT until pt can benefit from treatment at a later time.   Juluis Rainier [id number] 09/17/2013, 4:59 PM

## 2013-09-17 NOTE — Progress Notes (Addendum)
[facility]                                                                              Patient Demographics  Joann Gutierrez, is a 68 y.o. female, DOB - [birth year], [id number]  Admit date - 09/10/2013   Admitting Physician Cristal Ford, DO  Outpatient Primary MD for the patient is Kandice Hams, MD  LOS - 7   Chief Complaint  Patient presents with  . Abdominal Pain      HPI/Interim history 68 y.o. female with PMH of depression, DM 2, HTN, HLD, CAD, history of CVA, asthma/COPD, renal insufficiency/CKD stage 3, CHF, history of seizures. She presented to [facility] hospital on 8/21 with complaints of RUQ abdominal pain and SOB that started a day or two prior to admission. Pain was worsening and radiated to her back. She had some nausea but no vomiting. Surgery admitted the patient for possible biliary colic. Ultrasound did not visualize the gallbladder and it was presumably contracted. Liver and CBD appeared normal. CT scan of the abdomen and pelvis was then performed and showed normal appearing gallbladder with no other acute abnormalities identified. HIDA was also subsequently normal. Upon presentation, LFTs were extremely elevated at AST 984, ALT 310, ALP 254, normal total bili. LFT's have trended down since her hospitalization and are currently AST 102, ALT 160, ALP 161, and total bili still remains normal. Denied ETOH use, extensive tylenol or NSAID use. No recent travel or sick contacts to her knowledge.   In her GI history, she has undergone an EGD on 04/08/2013 by Dr. Deatra Ina for complaints of dysphagia. There was no obvious stricture but she was dilated with Dr. Venia Minks (had some resistance). She does have a history of an esophageal stricture in 02/2012 that was dilated during EGD as well. Colonoscopy 11/2010 she had two polyps removed (TA's) and diverticulosis; recall colonoscopy was entered for 5 years.  She continued to have N/V. LFT's trending down. Hepatitis panel negative.  GI following. EBV negative, CMV negative, ANA pending, WBC remains normal. Serum creatinine is up again today at 3.27. At this point, she does not have a surgical problem and needs to be medically managed.   Assessment & Plan   Acute on chronic kidney disease, stage III -Baseline creatinine appears to be 1.2-1.5, creatinine peaked at 3.53 -Creatinine trending downward -Nephrology consulted and appreciated -ARB and metolazone and Lasix were held, however, lasix restarted by nephrology to avoid fluid overload -Possibly secondary to contrast-induced nephropathy as well as poor oral intake prior to admission as well as outpatient medications -No hydronephrosis on CT scan -Patient has good urine output  Hyperkalemia -Resolved  Hypertension -ARB, metolazone and Lasix currently held  Diabetes mellitus type 2 -Continue insulin sliding scale and Lantus with CBG monitoring  Coronary artery disease -Currently stable, patient is chest pain-free -Patient has been seen as an outpatient for chest pain, she had a cath in the 02/2013 revealing minimal disease  COPD -Currently stable  Chronic diastolic heart failure -Currently compensated, EF 70% (cath in February 2015) -Lasix currently held due to to acute kidney injury -Continue to monitor daily weights, intake and output  Biliary obstruction with transaminitis -Appears to be improving -  Gastroenterology as well as general surgery consulted and appreciated -Hepatitis panel negative,?viral hepatitis -CMV, EBV negative -IgG, ANA, AMA negative/normal -CT of the abdomen showed normal appearance of gallbladder, normal urinary tract -Continue antiemetics for nausea, but will also order scheduled -GI believes this may be viral gastroenteritis -Surgery signed off -Patient tolerated a full liquid diet, will continue to advance -GI recommended that patient tolerate a solid diet before being discharged and has signed off.  Microcytic  Anemia -Appears to be chronic, possibly secondary to chronic disease -Hemoglobin appears to be stable at this point -Continue to monitor CBC -Discussed with Dr. Posey Pronto, who will give patient feraheme and procrit  Chest pain, likely musculoskeletal -Resolved -Troponin negative -No changes on EKG  Code Status: Full  Family Communication: None at bedside  Disposition Plan: Admitted  Time Spent in minutes   25 minutes  Procedures  none  Consults   Nephrology Gastroenterology General surgery  DVT Prophylaxis  Heparin  Lab Results  Component Value Date   PLT 220 09/17/2013    Medications  Scheduled Meds: . atorvastatin  40 mg Oral Daily  . budesonide-formoterol  2 puff Inhalation BID  . cefTRIAXone (ROCEPHIN)  IV  1 g Intravenous Q24H  . citalopram  20 mg Oral Daily  . diltiazem  240 mg Oral Daily  . donepezil  10 mg Oral QHS  . ezetimibe  10 mg Oral Daily  . fluticasone  2 spray Each Nare Daily  . furosemide  40 mg Oral BID  . gabapentin  300 mg Oral Daily  . heparin subcutaneous  5,000 Units Subcutaneous 3 times per day  . hydrALAZINE  100 mg Oral 3 times per day  . insulin aspart  0-15 Units Subcutaneous TID WC  . insulin glargine  15 Units Subcutaneous Daily  . isosorbide mononitrate  120 mg Oral Daily  . montelukast  10 mg Oral Daily  . ondansetron  4 mg Oral 3 times per day  . pantoprazole  40 mg Oral Daily  . raloxifene  60 mg Oral Daily   Continuous Infusions:   PRN Meds:.acetaminophen, albuterol, metoCLOPramide (REGLAN) injection, morphine injection, nitroGLYCERIN, ondansetron, sodium chloride  Antibiotics    Anti-infectives   Start     Dose/Rate Route Frequency Ordered Stop   09/14/13 0900  cefTRIAXone (ROCEPHIN) 1 g in dextrose 5 % 50 mL IVPB     1 g 100 mL/hr over 30 Minutes Intravenous Every 24 hours 09/14/13 [id number]        Subjective:   Joann Gutierrez seen and examined today.  Patient denies further abdominal pain, nausea, vomiting. She  was able to tolerate a liquid diet yesterday. Patient also had some loose brown stools. She was able to walk around. Patient denies any chest pain, shortness of breath, dizziness, headache at this time  Objective:   Filed Vitals:   09/16/13 2043 09/16/13 2343 09/17/13 0518 09/17/13 1100  BP:  133/49 132/53   Pulse:   68   Temp:   98 F (36.7 C)   TempSrc:   Oral   Resp:   17   Height:      Weight:   134.9 kg (297 lb 6.4 oz)   SpO2: 96%  96% 95%    Wt Readings from Last 3 Encounters:  09/17/13 134.9 kg (297 lb 6.4 oz)  06/02/13 122.471 kg (270 lb)  05/05/13 121.564 kg (268 lb)     Intake/Output Summary (Last 24 hours) at 09/17/13 1217 Last data filed at 09/16/13 2100  Gross per 24 hour  Intake    360 ml  Output    550 ml  Net   -190 ml    Exam  General: Well developed, well nourished, NAD, appears stated age  48: NCAT, mucous membranes moist.   Cardiovascular: S1 S2 auscultated, 3/6 SEM. Regular rate and rhythm.  Respiratory: Clear to auscultation bilaterally with equal chest rise  Abdomen: Soft, nontender, nondistended, + bowel sounds  Extremities: warm dry without cyanosis clubbing or edema  Neuro: AAOx3, no focal deficits    Data Review   Micro Results Recent Results (from the past 240 hour(s))  URINE CULTURE     Status: None   Collection Time    09/14/13 11:57 AM      Result Value Ref Range Status   Specimen Description URINE, CATHETERIZED   Final   Special Requests NONE   Final   Culture  Setup Time     Final   Value: 09/14/2013 12:25     Performed at [facility]     Final   Value: 70,000 COLONIES/ML     Performed at [facility]   Culture     Final   Value: YEAST     Performed at [facility]   Report Status 09/15/2013 FINAL   Final    Radiology Reports Dg Chest 1 View  09/11/2013   CLINICAL DATA:  Difficulty breathing and chest pain  EXAM: CHEST - 1 VIEW  COMPARISON:  September 11, 2013  FINDINGS:  Degree of inspiration is shallow. There is no edema or consolidation. Heart is upper normal in size with pulmonary vascularity within normal limits. No pneumothorax. No adenopathy. No bone lesions.  IMPRESSION: Shallow degree of inspiration.  No edema or consolidation.   Electronically Signed   By: Lowella Grip M.D.   On: 09/11/2013 19:20   Ct Head Wo Contrast  09/12/2013   CLINICAL DATA:  Headache, COPD  EXAM: CT HEAD WITHOUT CONTRAST  TECHNIQUE: Contiguous axial images were obtained from the base of the skull through the vertex without intravenous contrast.  COMPARISON:  08/18/1998  FINDINGS: No skull fracture is noted. Paranasal sinuses and mastoid air cells are unremarkable. No intracranial hemorrhage, mass effect or midline shift. Mild cerebral atrophy. No acute cortical infarction. No mass lesion is noted on this unenhanced scan. Periventricular white matter decreased attenuation probable due to chronic small vessel ischemic changes.  IMPRESSION: No acute intracranial abnormality. Mild cerebral atrophy. Periventricular white matter decreased attenuation probable due to chronic small vessel ischemic changes.   Electronically Signed   By: Lahoma [facility] M.D.   On: 09/12/2013 16:13   Nm Hepatobiliary  09/12/2013   CLINICAL DATA:  Abdominal pain and possible cholecystitis  EXAM: NUCLEAR MEDICINE HEPATOBILIARY IMAGING  TECHNIQUE: Sequential images of the abdomen were obtained out to 60 minutes following intravenous administration of radiopharmaceutical.  RADIOPHARMACEUTICALS:  5 [place] 123XX123 Choletec  COMPARISON:  Abdominal ultrasound of September 11, 2013.  FINDINGS: There is adequate radiopharmaceutical uptake by the liver. The common bile duct is visible by 25 min as is the gallbladder. Bowel activity becomes visible by approximately 90 min.  IMPRESSION: Normal hepatobiliary scan.  Abdominal pain, possible Select proper NMHepatobiliary template.   Electronically Signed   By: David  Martinique   On:  09/12/2013 12:49   Ct Abdomen Pelvis W Contrast  09/11/2013   CLINICAL DATA:  Abdominal pain, elevated LFTs, mildly compromised renal function  EXAM: CT ABDOMEN AND PELVIS  WITH CONTRAST  TECHNIQUE: Multidetector CT imaging of the abdomen and pelvis was performed using the standard protocol following bolus administration of intravenous contrast.  CONTRAST:  96mL OMNIPAQUE IOHEXOL 300 MG/ML  SOLN  COMPARISON:  Abdominal ultrasound of today's date  FINDINGS: The stomach is moderately distended with the oral contrast as well as with gas. The liver exhibits no focal mass or ductal dilation. The gallbladder is adequately distended now with no evidence of stones. The pancreas exhibits no focal mass or definite inflammatory change. The spleen, adrenal glands, and kidneys exhibit no acute abnormalities. There is stable hypodensity in the lower pole of the left kidney which exhibits HU value of 17 most compatible with a cyst. A 1 cm hypodensity in the midpole of the right kidney exhibits HU value of 1.  The contrast has traversed much of the small bowel but has not yet reached the colon. There is no evidence of ileus or obstruction or acute inflammation. There is no inguinal hernia. There is a umbilical hernia containing fat.  The caliber of the abdominal aorta is normal. The small and large bowel exhibit no evidence of ileus, obstruction, or acute inflammation. There is no ascites. The urinary bladder, prostate gland, and seminal vesicles are normal. The lumbar spine and bony pelvis exhibit no acute abnormalities. There are degenerative disc changes at L5-S1. The lung bases exhibit increased densities posteriorly, bilaterally consistent with atelectasis or pneumonia.  IMPRESSION: 1. Bibasilar atelectasis or pneumonia. 2. The gallbladder is visualized and appears normal. There is no acute hepatobiliary abnormality. 3. There is no acute urinary tract or bowel abnormality.   Electronically Signed   By: David  [place]   On:  09/11/2013 19:23   US Abdomen Limited  09/15/2013   CLINICAL DATA:  Re-evaluate gallbladder.  EXAM: US ABDOMEN LIMITED - RIGHT UPPER QUADRANT  COMPARISON:  CT 09/11/2013.  Ultrasound the same day.  FINDINGS: Gallbladder:  No gallstones or wall thickening visualized. No sonographic Murphy sign noted.  Common bile duct:  Diameter: Normal caliber, 5 mm.  Liver:  No focal lesion identified. Within normal limits in parenchymal echogenicity.  IMPRESSION: Gallbladder appears unremarkable on today's study. No evidence of cholelithiasis or acute cholecystitis.   Electronically Signed   By: Rolm Baptise M.D.   On: 09/15/2013 11:27   US Abdomen Limited  09/11/2013   CLINICAL DATA:  68 year old female with abdominal pain, right upper quadrant pain, abnormal LFTs. Initial encounter.  EXAM: US ABDOMEN LIMITED - RIGHT UPPER QUADRANT  COMPARISON:  CT Abdomen and Pelvis 01/30/2009.  FINDINGS: Gallbladder:  Not visualized, presumably contracted.  Common bile duct:  Diameter: 6 mm, normal.  Liver:  No focal lesion identified. Within normal limits in parenchymal echogenicity.  IMPRESSION: 1. Gallbladder not identified, presumably contracted. 2. Negative sonographic appearance of the liver and CBD.   Electronically Signed   By: Lars Pinks M.D.   On: 09/11/2013 02:18   Dg Abd Acute W/chest  09/11/2013   CLINICAL DATA:  Mid chest and lower abdominal pain  EXAM: ACUTE ABDOMEN SERIES (ABDOMEN 2 VIEW & CHEST 1 VIEW)  COMPARISON:  Prior radiograph from 04/02/2013  FINDINGS: Borderline cardiomegaly is stable as compared to prior study. Mediastinal silhouette within normal limits. Mild tortuosity of the intrathoracic aorta noted.  Lung volumes within normal limits. There is mild perihilar vascular congestion without overt pulmonary edema. No focal infiltrate or pleural effusion. No pneumothorax.  Visualized bowel gas pattern is within normal limits without evidence of obstruction or ileus. No abnormal  bowel wall thickening. No free  intraperitoneal air.  No soft tissue mass or abnormal calcification.  No acute osseus abnormality.  IMPRESSION: 1. Nonobstructive bowel gas pattern with no radiographic evidence acute intra-abdominal abnormality. 2. Borderline cardiomegaly with mild perihilar vascular congestion without overt pulmonary edema.   Electronically Signed   By: Jeannine Boga M.D.   On: 09/11/2013 01:11   Dg Abd Portable 1v  09/13/2013   CLINICAL DATA:  Abdominal pain.  EXAM: PORTABLE ABDOMEN - 1 VIEW  COMPARISON:  CT scan 09/11/2013.  FINDINGS: There is some persistent scattered contrast throughout the colon from the recent CT scan. No findings for small bowel obstruction or obvious free air but examination is limited by body habitus.  IMPRESSION: No findings for small bowel obstruction.   Electronically Signed   By: Kalman Jewels M.D.   On: 09/13/2013 08:51    CBC  Recent Labs Lab 09/10/13 2359 09/11/13 0900 09/12/13 0505 09/13/13 0534 09/14/13 0715 09/16/13 0550 09/17/13 0554  WBC 13.4* 11.8* 10.0 7.8 7.8 6.2 6.9  HGB 9.6* 8.9* 8.0* 8.3* 8.0* 7.7* 7.5*  HCT 29.4* 27.9* 25.1* 26.9* 25.3* 24.0* 23.2*  PLT 313 269 234 260 240 219 220  MCV 71.2* 73.0* 73.4* 75.4* 74.4* 72.9* 73.2*  MCH 23.2* 23.3* 23.4* 23.2* 23.5* 23.4* 23.7*  MCHC 32.7 31.9 31.9 30.9 31.6 32.1 32.3  RDW 14.6 14.9 15.1 15.5 15.3 14.8 15.0  LYMPHSABS 0.5* 1.8  --   --   --   --   --   MONOABS 0.8 0.6  --   --   --   --   --   EOSABS 0.0 0.0  --   --   --   --   --   BASOSABS 0.0 0.0  --   --   --   --   --     Chemistries   Recent Labs Lab 09/12/13 0505 09/13/13 0534 09/14/13 0715 09/15/13 0605 09/16/13 0550 09/17/13 0554  NA 141 141 145 142 140 139  K 5.8* 4.3 4.2 3.9 3.9 3.9  CL 104 105 108 105 103 102  CO2 25 23 23 23 25 25   GLUCOSE 133* 124* 136* 164* 130* 145*  BUN 33* 32* 38* 36* 33* 35*  CREATININE 3.53* 2.96* 3.27* 2.63* 2.21* 2.32*  CALCIUM 8.0* 7.9* 8.1* 8.5 8.4 8.6  AST 267* 102* 41* 23  --  22  ALT 238*  160* 100* 77*  --  46*  ALKPHOS 180* 161* 137* 140*  --  126*  BILITOT 0.4 0.4 0.4 0.4  --  0.2*   ------------------------------------------------------------------------------------------------------------------ estimated creatinine clearance is 31.8 ml/min (by C-G formula based on Cr of 2.32). ------------------------------------------------------------------------------------------------------------------ No results found for this basename: HGBA1C,  in the last 72 hours ------------------------------------------------------------------------------------------------------------------ No results found for this basename: CHOL, HDL, LDLCALC, TRIG, CHOLHDL, LDLDIRECT,  in the last 72 hours ------------------------------------------------------------------------------------------------------------------ No results found for this basename: TSH, T4TOTAL, FREET3, T3FREE, THYROIDAB,  in the last 72 hours ------------------------------------------------------------------------------------------------------------------ No results found for this basename: VITAMINB12, FOLATE, FERRITIN, TIBC, IRON, RETICCTPCT,  in the last 72 hours  Coagulation profile  Recent Labs Lab 09/13/13 1130  INR 1.04    No results found for this basename: DDIMER,  in the last 72 hours  Cardiac Enzymes  Recent Labs Lab 09/16/13 1023  TROPONINI <0.30   ------------------------------------------------------------------------------------------------------------------ No components found with this basename: POCBNP,     Meah Jiron D.O. on 09/17/2013 at 12:17 PM  Between 7am to 7pm - Pager -  [phone]  After 7pm go to [email or web address] - password [email or web address]  And look for the night coverage person covering for me after hours  [facility] Office  [phone]

## 2013-09-17 NOTE — Evaluation (Signed)
Physical Therapy Evaluation Patient Details Name: Joann Gutierrez MRN: [id number] DOB: August 03, 1945 Today's Date: 09/17/2013   History of Present Illness  pt admitted with R UQ pain and nausea.  Extensive workup in progress.  Biliary obstruction with transaminitis one active problem.  Clinical Impression  Pt admitted with/for R UQ pain and nausea.  Pt currently limited functionally due to the problems listed below.  (see problems list.)  Pt will benefit from PT to maximize function and safety to be able to get home safely with available assist from family and caregivers..     Follow Up Recommendations Home health PT    Equipment Recommendations  None recommended by PT    Recommendations for Other Services       Precautions / Restrictions Precautions Precautions: Fall      Mobility  Bed Mobility Overal bed mobility:  (not tested)             General bed mobility comments: up in chair already  Transfers Overall transfer level: Needs assistance Equipment used: Rolling walker (2 wheeled) Transfers: Sit to/from Stand Sit to Stand: Min assist         General transfer comment: cued for hand placement standing and sitting;  steady assist  Ambulation/Gait Ambulation/Gait assistance: Min assist Ambulation Distance (Feet): 80 Feet Assistive device: Rolling walker (2 wheeled) Gait Pattern/deviations: Step-through pattern;Wide base of support   Gait velocity interpretation: Below normal speed for age/gender General Gait Details: effortful gait with decr knee flexion bil.  Stairs            Wheelchair Mobility    Modified Rankin (Stroke Patients Only)       Balance Overall balance assessment: Needs assistance Sitting-balance support: No upper extremity supported;Feet supported Sitting balance-[name]: Fair     Standing balance support: Single extremity supported;Bilateral upper extremity supported Standing balance-[name]: Poor                               Pertinent Vitals/Pain Pain Assessment: Faces Faces Pain Scale: Hurts even more Pain Location: knees during MMT Pain Intervention(s): Limited activity within patient's tolerance    Home Living Family/patient expects to be discharged to:: Private residence Living Arrangements: Spouse/significant other Available Help at Discharge: Family;Friend(s);Personal care attendant (pt does not anticipate having to be alone) Type of Home: House Home Access: Stairs to enter Entrance Stairs-Rails: Psychiatric nurse of Steps: 6 Home Layout: One level Home Equipment: Walker - 2 wheels;Cane - single point;Toilet riser Additional Comments: stand up shower with no seat    Prior Function Level of Independence: Independent with assistive device(s)         Comments: usually uses cane when her knees act up, but no device at times     Hand Dominance        Extremity/Trunk Assessment   Upper Extremity Assessment: Defer to OT evaluation           Lower Extremity Assessment: Generalized weakness (MMT tested no better than 3/5 grossly bil, but fxn better)         Communication   Communication: No difficulties  Cognition Arousal/Alertness: Awake/alert Behavior During Therapy: WFL for tasks assessed/performed Overall Cognitive Status: Within Functional Limits for tasks assessed (but pt is not a great historian)                      General Comments      Exercises  Assessment/Plan    PT Assessment Patient needs continued PT services  PT Diagnosis Difficulty walking;Generalized weakness   PT Problem List Decreased strength;Decreased activity tolerance;Decreased mobility;Decreased knowledge of use of DME  PT Treatment Interventions DME instruction;Gait training;Stair training;Functional mobility training;Therapeutic activities;Neuromuscular re-education;Patient/family education   PT Goals (Current goals can be found in the  Care Plan section) Acute Rehab PT Goals Patient Stated Goal: get back home PT Goal Formulation: With patient Time For Goal Achievement: 09/24/13 Potential to Achieve Goals: Good    Frequency Min 3X/week   Barriers to discharge        Co-evaluation               End of Session   Activity Tolerance: Patient tolerated treatment well;Patient limited by fatigue Patient left: in chair;with call bell/phone within reach;with family/visitor present Nurse Communication: Mobility status         Time: 1425-1451 PT Time Calculation (min): 26 min   Charges:   PT Evaluation $Initial PT Evaluation Tier I: 1 Procedure PT Treatments $Gait Training: 8-22 mins   PT G Codes:          Kirandeep Fariss, Tessie Fass 09/17/2013, 3:06 PM  09/17/2013  Donnella Sham, PT [phone]  (pager)

## 2013-09-17 NOTE — Progress Notes (Addendum)
[facility] Progress Note    Since last GI note: Eating liquids without complaints.  Loose, brown stools.  Ambulated to halls yesterday (once I believe).  Objective: Vital signs in last 24 hours: Temp:  [98 F (36.7 C)-98.3 F (36.8 C)] 98 F (36.7 C) (08/28 0518) Pulse Rate:  [68-73] 68 (08/28 0518) Resp:  [17] 17 (08/28 0518) BP: (132-143)/(49-67) 132/53 mmHg (08/28 0518) SpO2:  [94 %-97 %] 96 % (08/28 0518) Weight:  [297 lb 6.4 oz (134.9 kg)] 297 lb 6.4 oz (134.9 kg) (08/28 0518) Last BM Date: 09/16/13 General: alert and oriented times 3 Heart: regular rate and rythm Abdomen: soft, non-tender, non-distended, normal bowel sounds   Lab Results:  Recent Labs  09/16/13 0550 09/17/13 0554  WBC 6.2 6.9  HGB 7.7* 7.5*  PLT 219 220  MCV 72.9* 73.2*    Recent Labs  09/15/13 0605 09/16/13 0550 09/17/13 0554  NA 142 140 139  K 3.9 3.9 3.9  CL 105 103 102  CO2 23 25 25   GLUCOSE 164* 130* 145*  BUN 36* 33* 35*  CREATININE 2.63* 2.21* 2.32*  CALCIUM 8.5 8.4 8.6    Recent Labs  09/15/13 0605 09/17/13 0554  PROT 6.8 6.4  ALBUMIN 2.8* 2.6*  AST 23 22  ALT 77* 46*  ALKPHOS 140* 126*  BILITOT 0.4 0.2*   Studies/Results: US Abdomen Limited  09/15/2013   CLINICAL DATA:  Re-evaluate gallbladder.  EXAM: US ABDOMEN LIMITED - RIGHT UPPER QUADRANT  COMPARISON:  CT 09/11/2013.  Ultrasound the same day.  FINDINGS: Gallbladder:  No gallstones or wall thickening visualized. No sonographic Murphy sign noted.  Common bile duct:  Diameter: Normal caliber, 5 mm.  Liver:  No focal lesion identified. Within normal limits in parenchymal echogenicity.  IMPRESSION: Gallbladder appears unremarkable on today's study. No evidence of cholelithiasis or acute cholecystitis.   Electronically Signed   By: Rolm Baptise M.D.   On: 09/15/2013 11:27     Medications: Scheduled Meds: . atorvastatin  40 mg Oral Daily  . budesonide-formoterol  2 puff Inhalation BID  . cefTRIAXone  (ROCEPHIN)  IV  1 g Intravenous Q24H  . citalopram  20 mg Oral Daily  . diltiazem  240 mg Oral Daily  . donepezil  10 mg Oral QHS  . ezetimibe  10 mg Oral Daily  . fluticasone  2 spray Each Nare Daily  . gabapentin  300 mg Oral Daily  . heparin subcutaneous  5,000 Units Subcutaneous 3 times per day  . hydrALAZINE  100 mg Oral 3 times per day  . insulin aspart  0-15 Units Subcutaneous TID WC  . insulin glargine  15 Units Subcutaneous Daily  . isosorbide mononitrate  120 mg Oral Daily  . montelukast  10 mg Oral Daily  . ondansetron  4 mg Oral 3 times per day  . pantoprazole  40 mg Oral Daily  . raloxifene  60 mg Oral Daily   Continuous Infusions:  PRN Meds:.acetaminophen, albuterol, metoCLOPramide (REGLAN) injection, morphine injection, nitroGLYCERIN, ondansetron, sodium chloride    Assessment/Plan: 68 y.o. female improving abd pain, LFTs  She is not overtly GI bleeding, stool has been brown.  Hb has drifted from her baseline 9s to 7.5 this AM.  CRI plus fluid dilution (fluid overloaded a bit?).  She had colonoscopy 2013, EGD 2015 and without overt bleeding I would not plan to repeat either for now.  Would consider blood transfusion, HL IVF.  Her LFTs have nearly completely normalized since admission.  Her  abd pain is much improved and she is tolerating diet.  She will have solids this morning.  She should continue to ambulate in halls, seems a bit deconditioned.  If she tolerates solids then she is OK to d/c from GI perspective but may need more time in hosp for fluid status equilibration.  Please call if any further questions or concerns.   Milus Banister, MD  09/17/2013, 8:57 AM [facility] Pager [phone]

## 2013-09-18 DIAGNOSIS — E7800 Pure hypercholesterolemia, unspecified: Secondary | ICD-10-CM

## 2013-09-18 LAB — RENAL FUNCTION PANEL
ALBUMIN: 2.7 g/dL — AB (ref 3.5–5.2)
Anion gap: 12 (ref 5–15)
BUN: 35 mg/dL — AB (ref 6–23)
CO2: 25 mEq/L (ref 19–32)
CREATININE: 2.29 mg/dL — AB (ref 0.50–1.10)
Calcium: 8.7 mg/dL (ref 8.4–10.5)
Chloride: 102 mEq/L (ref 96–112)
GFR calc Af Amer: 24 mL/min — ABNORMAL LOW (ref >90)
GFR calc non Af Amer: 21 mL/min — ABNORMAL LOW (ref >90)
Glucose, Bld: 108 mg/dL — ABNORMAL HIGH (ref 70–99)
PHOSPHORUS: 3.9 mg/dL (ref 2.3–4.6)
Potassium: 4.1 mEq/L (ref 3.7–5.3)
Sodium: 139 mEq/L (ref 137–147)

## 2013-09-18 LAB — GLUCOSE, CAPILLARY
Glucose-Capillary: 109 mg/dL — ABNORMAL HIGH (ref 70–99)
Glucose-Capillary: 136 mg/dL — ABNORMAL HIGH (ref 70–99)

## 2013-09-18 LAB — CBC
HEMATOCRIT: 23.6 % — AB (ref 36.0–46.0)
HEMOGLOBIN: 7.6 g/dL — AB (ref 12.0–15.0)
MCH: 23 pg — ABNORMAL LOW (ref 26.0–34.0)
MCHC: 32.2 g/dL (ref 30.0–36.0)
MCV: 71.3 fL — AB (ref 78.0–100.0)
Platelets: 250 10*3/uL (ref 150–400)
RBC: 3.31 MIL/uL — AB (ref 3.87–5.11)
RDW: 14.5 % (ref 11.5–15.5)
WBC: 6 10*3/uL (ref 4.0–10.5)

## 2013-09-18 LAB — MAGNESIUM: Magnesium: 2.3 mg/dL (ref 1.5–2.5)

## 2013-09-18 MED ORDER — ONDANSETRON HCL 4 MG PO TABS: 4.0000 mg | ORAL_TABLET | Freq: Three times a day (TID) | ORAL | Status: DC

## 2013-09-18 MED ORDER — TRAMADOL HCL 50 MG PO TABS: 50.0000 mg | ORAL_TABLET | Freq: Four times a day (QID) | ORAL | Status: DC | PRN

## 2013-09-18 MED ORDER — FUROSEMIDE 40 MG PO TABS: 40.0000 mg | ORAL_TABLET | Freq: Two times a day (BID) | ORAL | Status: DC

## 2013-09-18 MED ORDER — INSULIN GLARGINE 100 UNIT/ML ~~LOC~~ SOLN: 15.0000 [IU] | Freq: Every day | SUBCUTANEOUS | Status: DC

## 2013-09-18 NOTE — Discharge Summary (Signed)
Physician Discharge Summary  Joann Gutierrez [id number] DOB: July 27, 1945 DOA: 09/10/2013  PCP: Kandice Hams, MD  Admit date: 09/10/2013 Discharge date: 09/18/2013  Time spent: 45 minutes  Recommendations for Outpatient Follow-up:  Patient discharged home with home health physical therapy. She is to followup with primary care physician within one discharge. Patient was followed nephrology within 2 weeks of discharge. Patient is to continue taking her medications as prescribed.  She should also follow a heart healthy carb modified diet.   Discharge Diagnoses:  Acute on chronic kidney disease, stage III Hyperkalemia Hypertension Diabetes mellitus type 2 Coronary artery disease COPD Chronic diastolic heart failure Biliary obstruction with transaminitis Microcytic anemia Musculoskeletal chest pain  Discharge Condition: Stable  Diet recommendation: Heart healthy/carb modified  Filed Weights   09/15/13 0640 09/16/13 0443 09/17/13 0518  Weight: 131.997 kg (291 lb) 130.953 kg (288 lb 11.2 oz) 134.9 kg (297 lb 6.4 oz)    HPI/Interim history  68 y.o. female with PMH of depression, DM 2, HTN, HLD, CAD, history of CVA, asthma/COPD, renal insufficiency/CKD stage 3, CHF, history of seizures. She presented to [facility] hospital on 8/21 with complaints of RUQ abdominal pain and SOB that started a day or two prior to admission. Pain was worsening and radiated to her back. She had some nausea but no vomiting. Surgery admitted the patient for possible biliary colic. Ultrasound did not visualize the gallbladder and it was presumably contracted. Liver and CBD appeared normal. CT scan of the abdomen and pelvis was then performed and showed normal appearing gallbladder with no other acute abnormalities identified. HIDA was also subsequently normal. Upon presentation, LFTs were extremely elevated at AST 984, ALT 310, ALP 254, normal total bili. LFT's have trended down since her hospitalization and are  currently AST 102, ALT 160, ALP 161, and total bili still remains normal. Denied ETOH use, extensive tylenol or NSAID use. No recent travel or sick contacts to her knowledge.  In her GI history, she has undergone an EGD on 04/08/2013 by Dr. Deatra Ina for complaints of dysphagia. There was no obvious stricture but she was dilated with Dr. Venia Minks (had some resistance). She does have a history of an esophageal stricture in 02/2012 that was dilated during EGD as well. Colonoscopy 11/2010 she had two polyps removed (TA's) and diverticulosis; recall colonoscopy was entered for 5 years.  She continued to have N/V. LFT's trending down. Hepatitis panel negative. GI following. EBV negative, CMV negative, ANA pending, WBC remains normal. Serum creatinine is up again today at 3.27. At this point, she does not have a surgical problem and needs to be medically managed.   Hospital Course:  Acute on chronic kidney disease, stage III  -Baseline creatinine appears to be 1.2-1.5, creatinine peaked at 3.53  -Creatinine trending downward  -Nephrology consulted and appreciated  -ARB and metolazone and Lasix were held, however, lasix restarted by nephrology to avoid fluid overload  -Possibly secondary to contrast-induced nephropathy as well as poor oral intake prior to admission as well as outpatient medications  -No hydronephrosis on CT scan  -Patient has good urine output   Hyperkalemia  -Resolved   Hypertension  -ARB, metolazone held -Lasix restarted to avoid overload  Diabetes mellitus type 2  -Continue insulin sliding scale and Lantus with CBG monitoring   Coronary artery disease  -Currently stable, patient is chest pain-free  -Patient has been seen as an outpatient for chest pain, she had a cath in the 02/2013 revealing minimal disease   COPD  -Currently stable  Chronic diastolic heart failure  -Currently compensated, EF 70% (cath in February 2015)  -Continue to monitor daily weights, intake and output    -Continue lasix  Biliary obstruction with transaminitis  -Appears to be improving  -Gastroenterology as well as general surgery consulted and appreciated  -Hepatitis panel negative,?viral hepatitis  -CMV, EBV negative  -IgG, ANA, AMA negative/normal  -CT of the abdomen showed normal appearance of gallbladder, normal urinary tract  -Continue antiemetics for nausea -GI believes this may be viral gastroenteritis  -Surgery signed off  -GI recommended that patient tolerate a solid diet before being discharged and has signed off.  -Patient has been able to tolerate her diet   Microcytic Anemia  -Appears to be chronic, possibly secondary to chronic disease  -Hemoglobin appears to be stable at this point  -Continue to monitor CBC  -Discussed with Dr. Posey Pronto, patient received feraheme and procrit   Chest pain, likely musculoskeletal  -Resolved  -Troponin negative  -No changes on EKG  Procedures: Lower ext doppler: No evidence of deep vein thrombosis involving the right lower extremity and left lower extremity. Technically limited by pain and guarding.  Consultations: Nephrology  Gastroenterology  General surgery  Discharge Exam: Filed Vitals:   09/18/13 0559  BP: 150/57  Pulse: 72  Temp: 98.1 F (36.7 C)  Resp: 18   Exam  General: Well developed, well nourished, NAD, appears stated age  HEENT: NCAT, mucous membranes moist.  Cardiovascular: S1 S2 auscultated, 3/6 SEM. Regular rate and rhythm.  Respiratory: Clear to auscultation bilaterally with equal chest rise  Abdomen: Soft, nontender, nondistended, + bowel sounds  Extremities: warm dry without cyanosis clubbing or edema  Neuro: AAOx3, no focal deficits   Discharge Instructions      Discharge Instructions   Discharge instructions    Complete by:  As directed   Patient discharged home with home health physical therapy. She is to followup with primary care physician within one discharge. Patient was followed  nephrology within 2 weeks of discharge. Patient is to continue taking her medications as prescribed.  She should also follow a heart healthy carb modified diet.            Medication List    STOP taking these medications       losartan 100 MG tablet  Commonly known as:  COZAAR     metolazone 2.5 MG tablet  Commonly known as:  ZAROXOLYN     TraMADol HCl 50 MG Tbso  Replaced by:  traMADol 50 MG tablet      TAKE these medications       albuterol 108 (90 BASE) MCG/ACT inhaler  Commonly known as:  VENTOLIN HFA  Inhale 2 puffs into the lungs every 6 (six) hours as needed for shortness of breath.     albuterol (2.5 MG/3ML) 0.083% nebulizer solution  Commonly known as:  PROVENTIL  Take 2.5 mg by nebulization every 6 (six) hours as needed for wheezing or shortness of breath.     aspirin 325 MG tablet  Take 325 mg by mouth daily.     atorvastatin 40 MG tablet  Commonly known as:  LIPITOR  Take 1 tablet (40 mg total) by mouth daily.     budesonide-formoterol 160-4.5 MCG/ACT inhaler  Commonly known as:  SYMBICORT  Inhale 2 puffs into the lungs 2 (two) times daily.     CALTRATE PLUS PO  Take 600 mg by mouth daily.     citalopram 20 MG tablet  Commonly known as:  CELEXA  Take 20 mg by mouth daily.     cyclobenzaprine 10 MG tablet  Commonly known as:  FLEXERIL  Take 1 tablet (10 mg total) by mouth daily.     diclofenac 75 MG EC tablet  Commonly known as:  VOLTAREN  Take 1 tablet by mouth Daily.     diltiazem 240 MG 24 hr capsule  Commonly known as:  CARDIZEM CD  Take 240 mg by mouth daily.     donepezil 10 MG tablet  Commonly known as:  ARICEPT  Take 10 mg by mouth at bedtime.     ezetimibe 10 MG tablet  Commonly known as:  ZETIA  Take 10 mg by mouth daily.     fluticasone 50 MCG/ACT nasal spray  Commonly known as:  FLONASE  Place 2 sprays into both nostrils daily.     fluticasone 50 MCG/ACT nasal spray  Commonly known as:  FLONASE  Place 2 sprays into the  nose daily.     furosemide 40 MG tablet  Commonly known as:  LASIX  Take 1 tablet (40 mg total) by mouth 2 (two) times daily.     gabapentin 300 MG capsule  Commonly known as:  NEURONTIN  Take 1 capsule (300 mg total) by mouth 2 (two) times daily.     HUMULIN N 100 UNIT/ML injection  Generic drug:  insulin NPH Human  Inject 2-13 Units into the skin 3 (three) times daily after meals. Sliding scale with each meal     hydrALAZINE 100 MG tablet  Commonly known as:  APRESOLINE  Take 100 mg by mouth 3 (three) times daily.     insulin glargine 100 UNIT/ML injection  Commonly known as:  LANTUS  Inject 0.15 mLs (15 Units total) into the skin daily.     isosorbide mononitrate 120 MG 24 hr tablet  Commonly known as:  IMDUR  Take 1 tablet (120 mg total) by mouth daily.     KLOR-CON M20 20 MEQ tablet  Generic drug:  potassium chloride SA  Take 1 tablet by mouth Daily.     LIDODERM 5 %  Generic drug:  lidocaine  Place 1 patch onto the skin daily. As needed for pain     metoCLOPramide 10 MG tablet  Commonly known as:  REGLAN  Take 1 tablet (10 mg total) by mouth 4 (four) times daily.     mometasone-formoterol 100-5 MCG/ACT Aero  Commonly known as:  DULERA  Inhale 2 puffs into the lungs 2 (two) times daily.     montelukast 10 MG tablet  Commonly known as:  SINGULAIR  Take 1 tablet (10 mg total) by mouth daily.     MULTIVITAMIN PO  Take 1 tablet by mouth daily.     nitroGLYCERIN 0.4 MG/SPRAY spray  Commonly known as:  NITROLINGUAL  Place 1 spray under the tongue every 5 (five) minutes x 3 doses as needed for chest pain.     omeprazole 20 MG capsule  Commonly known as:  PRILOSEC  Take 20 mg by mouth daily.     ondansetron 4 MG tablet  Commonly known as:  ZOFRAN  Take 1 tablet (4 mg total) by mouth every 8 (eight) hours.     raloxifene 60 MG tablet  Commonly known as:  EVISTA  Take 60 mg by mouth daily.     sucralfate 1 GM/10ML suspension  Commonly known as:  CARAFATE   Take 10 mLs (1 g total) by mouth 2 (two) times daily.  traMADol 50 MG tablet  Commonly known as:  ULTRAM  Take 1 tablet (50 mg total) by mouth every 6 (six) hours as needed.       Allergies  Allergen Reactions  . Promethazine Hcl     REACTION: lethargy   Follow-up Information   Follow up with Kandice Hams, MD. Schedule an appointment as soon as possible for a visit in 1 week. [facility] followup)    Specialty:  Internal Medicine   Contact information:   [address] [place] [address] [phone]       Schedule an appointment as soon as possible for a visit with Ulla Potash., MD. [facility] followup)    Specialty:  Nephrology   Contact information:   [place] [address] [phone]        The results of significant diagnostics from this hospitalization (including imaging, microbiology, ancillary and laboratory) are listed below for reference.    Significant Diagnostic Studies: Dg Chest 1 View  09/11/2013   CLINICAL DATA:  Difficulty breathing and chest pain  EXAM: CHEST - 1 VIEW  COMPARISON:  September 11, 2013  FINDINGS: Degree of inspiration is shallow. There is no edema or consolidation. Heart is upper normal in size with pulmonary vascularity within normal limits. No pneumothorax. No adenopathy. No bone lesions.  IMPRESSION: Shallow degree of inspiration.  No edema or consolidation.   Electronically Signed   By: Lowella Grip M.D.   On: 09/11/2013 19:20   Ct Head Wo Contrast  09/12/2013   CLINICAL DATA:  Headache, COPD  EXAM: CT HEAD WITHOUT CONTRAST  TECHNIQUE: Contiguous axial images were obtained from the base of the skull through the vertex without intravenous contrast.  COMPARISON:  08/18/1998  FINDINGS: No skull fracture is noted. Paranasal sinuses and mastoid air cells are unremarkable. No intracranial hemorrhage, mass effect or midline shift. Mild cerebral atrophy. No acute cortical infarction. No mass lesion is noted on  this unenhanced scan. Periventricular white matter decreased attenuation probable due to chronic small vessel ischemic changes.  IMPRESSION: No acute intracranial abnormality. Mild cerebral atrophy. Periventricular white matter decreased attenuation probable due to chronic small vessel ischemic changes.   Electronically Signed   By: Lahoma Crocker M.D.   On: 09/12/2013 16:13   Nm Hepatobiliary  09/12/2013   CLINICAL DATA:  Abdominal pain and possible cholecystitis  EXAM: NUCLEAR MEDICINE HEPATOBILIARY IMAGING  TECHNIQUE: Sequential images of the abdomen were obtained out to 60 minutes following intravenous administration of radiopharmaceutical.  RADIOPHARMACEUTICALS:  5 [place] 123XX123 Choletec  COMPARISON:  Abdominal ultrasound of September 11, 2013.  FINDINGS: There is adequate radiopharmaceutical uptake by the liver. The common bile duct is visible by 25 min as is the gallbladder. Bowel activity becomes visible by approximately 90 min.  IMPRESSION: Normal hepatobiliary scan.  Abdominal pain, possible Select proper NMHepatobiliary template.   Electronically Signed   By: David  Martinique   On: 09/12/2013 12:49   Ct Abdomen Pelvis W Contrast  09/11/2013   CLINICAL DATA:  Abdominal pain, elevated LFTs, mildly compromised renal function  EXAM: CT ABDOMEN AND PELVIS WITH CONTRAST  TECHNIQUE: Multidetector CT imaging of the abdomen and pelvis was performed using the standard protocol following bolus administration of intravenous contrast.  CONTRAST:  26mL OMNIPAQUE IOHEXOL 300 MG/ML  SOLN  COMPARISON:  Abdominal ultrasound of today's date  FINDINGS: The stomach is moderately distended with the oral contrast as well as with gas. The liver exhibits no focal mass or ductal dilation.  The gallbladder is adequately distended now with no evidence of stones. The pancreas exhibits no focal mass or definite inflammatory change. The spleen, adrenal glands, and kidneys exhibit no acute abnormalities. There is stable hypodensity in  the lower pole of the left kidney which exhibits HU value of 17 most compatible with a cyst. A 1 cm hypodensity in the midpole of the right kidney exhibits HU value of 1.  The contrast has traversed much of the small bowel but has not yet reached the colon. There is no evidence of ileus or obstruction or acute inflammation. There is no inguinal hernia. There is a umbilical hernia containing fat.  The caliber of the abdominal aorta is normal. The small and large bowel exhibit no evidence of ileus, obstruction, or acute inflammation. There is no ascites. The urinary bladder, prostate gland, and seminal vesicles are normal. The lumbar spine and bony pelvis exhibit no acute abnormalities. There are degenerative disc changes at L5-S1. The lung bases exhibit increased densities posteriorly, bilaterally consistent with atelectasis or pneumonia.  IMPRESSION: 1. Bibasilar atelectasis or pneumonia. 2. The gallbladder is visualized and appears normal. There is no acute hepatobiliary abnormality. 3. There is no acute urinary tract or bowel abnormality.   Electronically Signed   By: David  Martinique   On: 09/11/2013 19:23   US Abdomen Limited  09/15/2013   CLINICAL DATA:  Re-evaluate gallbladder.  EXAM: US ABDOMEN LIMITED - RIGHT UPPER QUADRANT  COMPARISON:  CT 09/11/2013.  Ultrasound the same day.  FINDINGS: Gallbladder:  No gallstones or wall thickening visualized. No sonographic Murphy sign noted.  Common bile duct:  Diameter: Normal caliber, 5 mm.  Liver:  No focal lesion identified. Within normal limits in parenchymal echogenicity.  IMPRESSION: Gallbladder appears unremarkable on today's study. No evidence of cholelithiasis or acute cholecystitis.   Electronically Signed   By: Rolm Baptise M.D.   On: 09/15/2013 11:27   US Abdomen Limited  09/11/2013   CLINICAL DATA:  68 year old female with abdominal pain, right upper quadrant pain, abnormal LFTs. Initial encounter.  EXAM: US ABDOMEN LIMITED - RIGHT UPPER QUADRANT   COMPARISON:  CT Abdomen and Pelvis 01/30/2009.  FINDINGS: Gallbladder:  Not visualized, presumably contracted.  Common bile duct:  Diameter: 6 mm, normal.  Liver:  No focal lesion identified. Within normal limits in parenchymal echogenicity.  IMPRESSION: 1. Gallbladder not identified, presumably contracted. 2. Negative sonographic appearance of the liver and CBD.   Electronically Signed   By: Lars Pinks M.D.   On: 09/11/2013 02:18   Dg Abd Acute W/chest  09/11/2013   CLINICAL DATA:  Mid chest and lower abdominal pain  EXAM: ACUTE ABDOMEN SERIES (ABDOMEN 2 VIEW & CHEST 1 VIEW)  COMPARISON:  Prior radiograph from 04/02/2013  FINDINGS: Borderline cardiomegaly is stable as compared to prior study. Mediastinal silhouette within normal limits. Mild tortuosity of the intrathoracic aorta noted.  Lung volumes within normal limits. There is mild perihilar vascular congestion without overt pulmonary edema. No focal infiltrate or pleural effusion. No pneumothorax.  Visualized bowel gas pattern is within normal limits without evidence of obstruction or ileus. No abnormal bowel wall thickening. No free intraperitoneal air.  No soft tissue mass or abnormal calcification.  No acute osseus abnormality.  IMPRESSION: 1. Nonobstructive bowel gas pattern with no radiographic evidence acute intra-abdominal abnormality. 2. Borderline cardiomegaly with mild perihilar vascular congestion without overt pulmonary edema.   Electronically Signed   By: Jeannine Boga M.D.   On: 09/11/2013 01:11   Dg Abd  Portable 1v  09/13/2013   CLINICAL DATA:  Abdominal pain.  EXAM: PORTABLE ABDOMEN - 1 VIEW  COMPARISON:  CT scan 09/11/2013.  FINDINGS: There is some persistent scattered contrast throughout the colon from the recent CT scan. No findings for small bowel obstruction or obvious free air but examination is limited by body habitus.  IMPRESSION: No findings for small bowel obstruction.   Electronically Signed   By: Kalman Jewels M.D.    On: 09/13/2013 08:51    Microbiology: Recent Results (from the past 240 hour(s))  URINE CULTURE     Status: None   Collection Time    09/14/13 11:57 AM      Result Value Ref Range Status   Specimen Description URINE, CATHETERIZED   Final   Special Requests NONE   Final   Culture  Setup Time     Final   Value: 09/14/2013 12:25     Performed at [facility]     Final   Value: 70,000 COLONIES/ML     Performed at [facility]   Culture     Final   Value: YEAST     Performed at [facility]   Report Status 09/15/2013 FINAL   Final     Labs: Basic Metabolic Panel:  Recent Labs Lab 09/14/13 0715 09/15/13 0605 09/16/13 0550 09/17/13 0554 09/18/13 0518  NA 145 142 140 139 139  K 4.2 3.9 3.9 3.9 4.1  CL 108 105 103 102 102  CO2 23 23 25 25 25   GLUCOSE 136* 164* 130* 145* 108*  BUN 38* 36* 33* 35* 35*  CREATININE 3.27* 2.63* 2.21* 2.32* 2.29*  CALCIUM 8.1* 8.5 8.4 8.6 8.7  MG  --   --   --   --  2.3  PHOS  --   --   --   --  3.9   Liver Function Tests:  Recent Labs Lab 09/12/13 0505 09/13/13 0534 09/14/13 0715 09/15/13 0605 09/17/13 0554 09/18/13 0518  AST 267* 102* 41* 23 22  --   ALT 238* 160* 100* 77* 46*  --   ALKPHOS 180* 161* 137* 140* 126*  --   BILITOT 0.4 0.4 0.4 0.4 0.2*  --   PROT 6.5 6.5 6.4 6.8 6.4  --   ALBUMIN 2.8* 2.7* 2.6* 2.8* 2.6* 2.7*    Recent Labs Lab 09/14/13 1040  LIPASE 20   No results found for this basename: AMMONIA,  in the last 168 hours CBC:  Recent Labs Lab 09/13/13 0534 09/14/13 0715 09/16/13 0550 09/17/13 0554 09/18/13 1029  WBC 7.8 7.8 6.2 6.9 6.0  HGB 8.3* 8.0* 7.7* 7.5* 7.6*  HCT 26.9* 25.3* 24.0* 23.2* 23.6*  MCV 75.4* 74.4* 72.9* 73.2* 71.3*  PLT 260 240 219 220 250   Cardiac Enzymes:  Recent Labs Lab 09/16/13 1023  TROPONINI <0.30   BNP: BNP (last 3 results)  Recent Labs  12/15/12 1114  PROBNP 24.0   CBG:  Recent Labs Lab 09/17/13 1138 09/17/13 1706  09/17/13 1720 09/17/13 2144 09/18/13 0732  GLUCAP 219* 162* 154* 117* 109*       Signed:  Stewart Sasaki  [facility] 09/18/2013, 11:21 AM

## 2013-09-18 NOTE — Care Management Note (Signed)
CARE MANAGEMENT NOTE 09/18/2013  Patient:  Joann Gutierrez, Joann Gutierrez   Account Number:  [id number]  Date Initiated:  09/18/2013  Documentation initiated by:  Ricki Miller  Subjective/Objective Assessment:   68 yr old female admitted with RUQ abdominal pain and shortness of breath.     Action/Plan:   Case manager spoke with patient concerning home health needs at discharge. patient states she has used Haematologist and wants to do so now. Referral called to Rhett Bannister, [place] Liaison. Patient has rolling walker and family support at d/c.   Anticipated DC Date:  09/18/2013   Anticipated DC Plan:  [place]  CM consult      [facility] Choice  HOME HEALTH   Choice offered to / List presented to:  C-1 Patient   DME arranged  NA        [place] arranged  HH-2 PT      [place]   Status of service:  Completed, signed off Medicare Important Message given?   (If response is "NO", the following Medicare IM given date fields will be blank) Date Medicare IM given:   Medicare IM given by:   Date Additional Medicare IM given:   Additional Medicare IM given by:    Discharge Disposition:  [place]  Per UR Regulation:  Reviewed for med. necessity/level of care/duration of stay

## 2013-09-18 NOTE — Progress Notes (Signed)
Patient ID: Joann Gutierrez, female   DOB: 1945/04/30, 68 y.o.   MRN: [id number]  [facility] Progress Note    Assessment/ Plan:   1. Acute renal failure on chronic kidney disease stage III: Appears to be from contrast-induced nephropathy and possibly hemodynamic/ischemic ATN from relative hypotension. Renal function continues to recover slowly and she is tolerating diuretics. She has an appointment with me on 09/28/13 per her recollection. 2. Abdominal pain/elevated LFTs: Ongoing evaluation by gastroenterology, noted to have improved/resolved- all studies to inquire into viral/autoimmune hepatitis so far negative 3. Hypernatremia: corrected status post hypotonic intravenous fluids-restart furosemide and continue to monitor. 4. Hypertension: Continue to monitor on current therapy (ARB on hold). Restart furosemide  OK to DC home today from renal standpoint  Subjective:   Reports that she feels better today and did not have abdominal pain last night   Objective:   BP 150/57  Pulse 72  Temp(Src) 98.1 F (36.7 C) (Oral)  Resp 18  Ht 5\' 4"  (1.626 m)  Wt 134.9 kg (297 lb 6.4 oz)  BMI 51.02 kg/m2  SpO2 96%  Intake/Output Summary (Last 24 hours) at 09/18/13 1044 Last data filed at 09/18/13 0600  Gross per 24 hour  Intake      0 ml  Output    250 ml  Net   -250 ml   Weight change:   Physical Exam: Gen: Appears comfortable sitting in recliner - husband at bedside CVS: Pulse regular in rate and rhythm, S1 and S2 normal Resp: both lung fields clear to auscultation Abd: Soft, obese,  Minimally tender upper quadrants Ext: Trace to 1+ lower extremity edema  Imaging: No results found.  Labs: BMET  Recent Labs Lab 09/12/13 0505 09/13/13 0534 09/14/13 0715 09/15/13 0605 09/16/13 0550 09/17/13 0554 09/18/13 0518  NA 141 141 145 142 140 139 139  K 5.8* 4.3 4.2 3.9 3.9 3.9 4.1  CL 104 105 108 105 103 102 102  CO2 25 23 23 23 25 25 25   GLUCOSE 133* 124* 136*  164* 130* 145* 108*  BUN 33* 32* 38* 36* 33* 35* 35*  CREATININE 3.53* 2.96* 3.27* 2.63* 2.21* 2.32* 2.29*  CALCIUM 8.0* 7.9* 8.1* 8.5 8.4 8.6 8.7  PHOS  --   --   --   --   --   --  3.9   CBC  Recent Labs Lab 09/13/13 0534 09/14/13 0715 09/16/13 0550 09/17/13 0554  WBC 7.8 7.8 6.2 6.9  HGB 8.3* 8.0* 7.7* 7.5*  HCT 26.9* 25.3* 24.0* 23.2*  MCV 75.4* 74.4* 72.9* 73.2*  PLT 260 240 219 220    Medications:    . atorvastatin  40 mg Oral Daily  . budesonide-formoterol  2 puff Inhalation BID  . cefTRIAXone (ROCEPHIN)  IV  1 g Intravenous Q24H  . citalopram  20 mg Oral Daily  . darbepoetin (ARANESP) injection - NON-DIALYSIS  100 mcg Subcutaneous Q Fri-1800  . diltiazem  240 mg Oral Daily  . donepezil  10 mg Oral QHS  . ezetimibe  10 mg Oral Daily  . fluticasone  2 spray Each Nare Daily  . furosemide  40 mg Oral BID  . gabapentin  300 mg Oral Daily  . heparin subcutaneous  5,000 Units Subcutaneous 3 times per day  . hydrALAZINE  100 mg Oral 3 times per day  . insulin aspart  0-15 Units Subcutaneous TID WC  . insulin glargine  15 Units Subcutaneous Daily  . isosorbide mononitrate  120 mg  Oral Daily  . montelukast  10 mg Oral Daily  . ondansetron  4 mg Oral 3 times per day  . pantoprazole  40 mg Oral Daily  . raloxifene  60 mg Oral Daily   [name], MD 09/18/2013, 10:44 AM

## 2013-09-18 NOTE — Evaluation (Signed)
Occupational Therapy Evaluation Patient Details Name: Joann Gutierrez MRN: [id number] DOB: 09-20-1945 Today's Date: 09/18/2013    History of Present Illness pt admitted with R UQ pain and nausea.  Extensive workup in progress.  Biliary obstruction with transaminitis one active problem.   Clinical Impression   Pt admitted with above. Spouse present during session for education. Pt d/c summary in and pt planning to d/c today.   Follow Up Recommendations  No OT follow up;Supervision/Assistance - 24 hour    Equipment Recommendations  None recommended by OT    Recommendations for Other Services       Precautions / Restrictions Precautions Precautions: Fall Restrictions Weight Bearing Restrictions: No      Mobility Bed Mobility               General bed mobility comments: not assessed-pt up in chair.  Transfers Overall transfer level: Needs assistance Equipment used: Rolling walker (2 wheeled) Transfers: Sit to/from Stand Sit to Stand: Min guard         General transfer comment: Min guard for safety.    Balance                                            ADL Overall ADL's : Needs assistance/impaired     Grooming: Oral care;Min guard;Standing           Upper Body Dressing : Set up;Sitting   Lower Body Dressing: Sit to/from stand;With adaptive equipment;Minimal assistance   Toilet Transfer: Min guard;Ambulation;RW;BSC       Tub/ Shower Transfer: Minimal assistance;Ambulation;Rolling walker;3 in 1   Functional mobility during ADLs: Min guard;Rolling walker General ADL Comments: Educated on safety tips for home (rugs, safe shoewear, use of bag on walker, sitting for LB ADLs). Educated on AE for LB ADLs and pt practiced with sockaid. Educated on shower transfer technique and pt practiced. Educated on a few energy conservation techniques. Spoke about gait belt for family use.     Vision                     Perception     Praxis      Pertinent Vitals/Pain Pain Assessment: No/denies pain     Hand Dominance     Extremity/Trunk Assessment Upper Extremity Assessment Upper Extremity Assessment: Overall WFL for tasks assessed   Lower Extremity Assessment Lower Extremity Assessment: Defer to PT evaluation       Communication Communication Communication: No difficulties   Cognition Arousal/Alertness: Awake/alert Behavior During Therapy: WFL for tasks assessed/performed Overall Cognitive Status: History of cognitive impairments - at baseline       Memory: Decreased short-term memory             General Comments       Exercises       Shoulder Instructions      Home Living Family/patient expects to be discharged to:: Private residence Living Arrangements: Spouse/significant other Available Help at Discharge: Family;Friend(s);Personal care attendant;Available 24 hours/day Type of Home: House Home Access: Stairs to enter [employer] of Steps: 6 Entrance Stairs-Rails: Right;Left Home Layout: One level     Bathroom Shower/Tub: Occupational psychologist: Standard     Home Equipment: Environmental consultant - 2 wheels;Cane - single point;Toilet riser;Shower seat     Prior Functioning/Environment Level of Independence: Independent with assistive device(s)  Comments: usually uses cane when her knees act up, but no device at times    OT Diagnosis:     OT Problem List:     OT Treatment/Interventions:      OT Goals(Current goals can be found in the care plan section)    OT Frequency:     Barriers to D/C:            Co-evaluation              End of Session Equipment Utilized During Treatment: Gait belt;Rolling walker  Activity Tolerance: Other (comment);Patient limited by fatigue (became dizzy) Patient left: in chair;with call bell/phone within reach;with family/visitor present   Time: [id number] OT Time Calculation (min): 22 min Charges:  OT General  Charges $OT Visit: 1 Procedure OT Evaluation $Initial OT Evaluation Tier I: 1 Procedure OT Treatments $Self Care/Home Management : 8-22 mins G-CodesBenito Mccreedy OTR/L [id number] 09/18/2013, 12:40 PM

## 2013-09-18 NOTE — Discharge Instructions (Signed)

## 2013-09-18 NOTE — Progress Notes (Signed)
Pt. Discharge to home. Discharge instruction given to patient. No question verbalized.

## 2013-09-21 DIAGNOSIS — R7402 Elevation of levels of lactic acid dehydrogenase (LDH): Secondary | ICD-10-CM | POA: Diagnosis not present

## 2013-09-21 DIAGNOSIS — I129 Hypertensive chronic kidney disease with stage 1 through stage 4 chronic kidney disease, or unspecified chronic kidney disease: Secondary | ICD-10-CM | POA: Diagnosis not present

## 2013-09-21 DIAGNOSIS — R413 Other amnesia: Secondary | ICD-10-CM | POA: Diagnosis not present

## 2013-09-21 DIAGNOSIS — F0390 Unspecified dementia without behavioral disturbance: Secondary | ICD-10-CM | POA: Diagnosis not present

## 2013-09-21 DIAGNOSIS — F3289 Other specified depressive episodes: Secondary | ICD-10-CM | POA: Diagnosis not present

## 2013-09-21 DIAGNOSIS — Z87891 Personal history of nicotine dependence: Secondary | ICD-10-CM | POA: Diagnosis not present

## 2013-09-21 DIAGNOSIS — K8020 Calculus of gallbladder without cholecystitis without obstruction: Secondary | ICD-10-CM | POA: Diagnosis not present

## 2013-09-21 DIAGNOSIS — N183 Chronic kidney disease, stage 3 unspecified: Secondary | ICD-10-CM | POA: Diagnosis not present

## 2013-09-21 DIAGNOSIS — A088 Other specified intestinal infections: Secondary | ICD-10-CM | POA: Diagnosis not present

## 2013-09-21 DIAGNOSIS — E1129 Type 2 diabetes mellitus with other diabetic kidney complication: Secondary | ICD-10-CM | POA: Diagnosis not present

## 2013-09-21 DIAGNOSIS — R569 Unspecified convulsions: Secondary | ICD-10-CM | POA: Diagnosis not present

## 2013-09-21 DIAGNOSIS — I2510 Atherosclerotic heart disease of native coronary artery without angina pectoris: Secondary | ICD-10-CM | POA: Diagnosis not present

## 2013-09-21 DIAGNOSIS — I5032 Chronic diastolic (congestive) heart failure: Secondary | ICD-10-CM | POA: Diagnosis not present

## 2013-09-21 DIAGNOSIS — E119 Type 2 diabetes mellitus without complications: Secondary | ICD-10-CM | POA: Diagnosis not present

## 2013-09-21 DIAGNOSIS — E7800 Pure hypercholesterolemia, unspecified: Secondary | ICD-10-CM | POA: Diagnosis not present

## 2013-09-21 DIAGNOSIS — I10 Essential (primary) hypertension: Secondary | ICD-10-CM | POA: Diagnosis not present

## 2013-09-21 DIAGNOSIS — R269 Unspecified abnormalities of gait and mobility: Secondary | ICD-10-CM | POA: Diagnosis not present

## 2013-09-21 DIAGNOSIS — F329 Major depressive disorder, single episode, unspecified: Secondary | ICD-10-CM | POA: Diagnosis not present

## 2013-09-21 DIAGNOSIS — J449 Chronic obstructive pulmonary disease, unspecified: Secondary | ICD-10-CM | POA: Diagnosis not present

## 2013-09-21 DIAGNOSIS — R7989 Other specified abnormal findings of blood chemistry: Secondary | ICD-10-CM | POA: Diagnosis not present

## 2013-09-24 ENCOUNTER — Encounter: Payer: Self-pay | Admitting: Pulmonary Disease

## 2013-09-24 ENCOUNTER — Ambulatory Visit (INDEPENDENT_AMBULATORY_CARE_PROVIDER_SITE_OTHER): Payer: Medicare Other | Admitting: Pulmonary Disease

## 2013-09-24 VITALS — BP 132/74 | HR 66 | Ht 64.5 in | Wt 261.6 lb

## 2013-09-24 DIAGNOSIS — J9610 Chronic respiratory failure, unspecified whether with hypoxia or hypercapnia: Secondary | ICD-10-CM

## 2013-09-24 DIAGNOSIS — R0609 Other forms of dyspnea: Secondary | ICD-10-CM | POA: Diagnosis not present

## 2013-09-24 DIAGNOSIS — Z23 Encounter for immunization: Secondary | ICD-10-CM

## 2013-09-24 DIAGNOSIS — J449 Chronic obstructive pulmonary disease, unspecified: Secondary | ICD-10-CM

## 2013-09-24 DIAGNOSIS — R0902 Hypoxemia: Secondary | ICD-10-CM

## 2013-09-24 DIAGNOSIS — J9611 Chronic respiratory failure with hypoxia: Secondary | ICD-10-CM

## 2013-09-24 DIAGNOSIS — G4733 Obstructive sleep apnea (adult) (pediatric): Secondary | ICD-10-CM | POA: Diagnosis not present

## 2013-09-24 DIAGNOSIS — Z9989 Dependence on other enabling machines and devices: Principal | ICD-10-CM

## 2013-09-24 DIAGNOSIS — R0989 Other specified symptoms and signs involving the circulatory and respiratory systems: Secondary | ICD-10-CM

## 2013-09-24 DIAGNOSIS — R0600 Dyspnea, unspecified: Secondary | ICD-10-CM

## 2013-09-24 NOTE — Assessment & Plan Note (Signed)
She is to continue CPAP 9 cm H2O with 2 liters oxygen.  Will see if her DME can either repair or replace her CPAP machine.

## 2013-09-24 NOTE — Assessment & Plan Note (Signed)
Multifactorial >> COPD/asthma, chronic Rt hemidiaphragm elevation, obesity with deconditioning and diastolic heart failure.  She has done well with pulmonary rehab.

## 2013-09-24 NOTE — Assessment & Plan Note (Signed)
She is to continue symbicort, singulair, and prn albuterol.

## 2013-09-24 NOTE — Assessment & Plan Note (Signed)
Related to COPD with asthma, and chronic Rt hemidiaphragm elevation.

## 2013-09-24 NOTE — Patient Instructions (Signed)
Will arrange for new CPAP machine  Follow up in 6 months 

## 2013-09-24 NOTE — Progress Notes (Signed)
Chief Complaint  Patient presents with  . Follow-up    COPD-- Pt c/o incr Cough with white mucus at times, SOB, wheezing, chest tightness and CP x 2-3 months. Pt completed Pulmonary rehab. Pt wears CPAP nightly -- unable to wear currently because it is broken--[facility] needs OV notes to proceed with replacing machine. Pt using 2L O2 at night.     CC: [name], [name]  History of Present Illness: DEEPTI Gutierrez is a 68 y.o. 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  She was in hospital recently because of abdominal pain.  She has done well with pulmonary rehab and feels this has helped.    She gets episodes of wheezing.  She uses her albuterol and this helps.  She continues to use CPAP with 2 liters oxygen at night.  She thinks her CPAP machine is broken, and was told by her DME that she needs an order to get a new machine.  CXR from 09/11/13 showed shallow inspiration, but otherwise unremarkable.  TESTS: PSG 04/28/09 >> AHI 11, SpO2 low 68% Spirometry 07/05/10 >> FEV1 1.30 (64%), FEV1% 80 >> coughing during test Echo 05/21/11 >> EF 55 to 60%, mild LVH, mild/mod TR, PAS 45 mmHg ONO with CPAP and room air 10/22/12 >> Test time 10 hrs 59 min. Mean SpO2 92.4%, low SpO2 75%. Spent 16 min with SpO2 < 89%. RAST 12/11/12 >> negative, IgE 63.3 Echo 02/04/13 >> mod LVH, EF 65 to [id number], grade 1 diastolic dysfx, PAS 42 mmHg Lt, Rt Heart cath 03/03/13 >> RA 10, RV 38/7, PA 41/16 28, PCWP 13, LV 142/5, AO 137/76 PFT 03/17/13 >> FEV1 1.27 (67%), FEV1% 84, TLC 4.06 (80%), DLCO 57%, borderline BD  PMHx, PSHx, Medications, Allergies, Fhx, Shx reviewed.  Physical Exam:  General - No distress ENT - No sinus tenderness, no oral exudate, no LAN Cardiac - s1s2 regular, no murmur Chest - no wheeze/rales Back - No focal tenderness Abd - Soft, non-tender Ext - No edema Neuro - Normal strength Skin - No rashes Psych - normal mood, and  behavior   Assessment/Plan:  Joann Mires, MD [facility]Critical [facility]/Sleep Pager:  [phone]

## 2013-09-28 DIAGNOSIS — K8020 Calculus of gallbladder without cholecystitis without obstruction: Secondary | ICD-10-CM | POA: Diagnosis not present

## 2013-09-28 DIAGNOSIS — R269 Unspecified abnormalities of gait and mobility: Secondary | ICD-10-CM | POA: Diagnosis not present

## 2013-09-28 DIAGNOSIS — I10 Essential (primary) hypertension: Secondary | ICD-10-CM | POA: Diagnosis not present

## 2013-09-28 DIAGNOSIS — D638 Anemia in other chronic diseases classified elsewhere: Secondary | ICD-10-CM | POA: Diagnosis not present

## 2013-09-28 DIAGNOSIS — F0390 Unspecified dementia without behavioral disturbance: Secondary | ICD-10-CM | POA: Diagnosis not present

## 2013-09-28 DIAGNOSIS — E119 Type 2 diabetes mellitus without complications: Secondary | ICD-10-CM | POA: Diagnosis not present

## 2013-09-28 DIAGNOSIS — I129 Hypertensive chronic kidney disease with stage 1 through stage 4 chronic kidney disease, or unspecified chronic kidney disease: Secondary | ICD-10-CM | POA: Diagnosis not present

## 2013-09-28 DIAGNOSIS — M948X9 Other specified disorders of cartilage, unspecified sites: Secondary | ICD-10-CM | POA: Diagnosis not present

## 2013-09-28 DIAGNOSIS — N183 Chronic kidney disease, stage 3 unspecified: Secondary | ICD-10-CM | POA: Diagnosis not present

## 2013-09-30 ENCOUNTER — Telehealth: Payer: Self-pay | Admitting: Pulmonary Disease

## 2013-09-30 DIAGNOSIS — I129 Hypertensive chronic kidney disease with stage 1 through stage 4 chronic kidney disease, or unspecified chronic kidney disease: Secondary | ICD-10-CM | POA: Diagnosis not present

## 2013-09-30 DIAGNOSIS — E119 Type 2 diabetes mellitus without complications: Secondary | ICD-10-CM | POA: Diagnosis not present

## 2013-09-30 DIAGNOSIS — R269 Unspecified abnormalities of gait and mobility: Secondary | ICD-10-CM | POA: Diagnosis not present

## 2013-09-30 DIAGNOSIS — F0390 Unspecified dementia without behavioral disturbance: Secondary | ICD-10-CM | POA: Diagnosis not present

## 2013-09-30 DIAGNOSIS — N183 Chronic kidney disease, stage 3 unspecified: Secondary | ICD-10-CM | POA: Diagnosis not present

## 2013-09-30 DIAGNOSIS — K8020 Calculus of gallbladder without cholecystitis without obstruction: Secondary | ICD-10-CM | POA: Diagnosis not present

## 2013-09-30 NOTE — Telephone Encounter (Signed)
Per Corene Cornea w/ [facility]; We never received a staff message on this pt, but I have pulled the referral to be processed. The pt will be called to come in store to have the machine checked out. Thanks!   I called pt, made pt aware. Nothing further needed

## 2013-09-30 NOTE — Telephone Encounter (Signed)
[facility]. He is sending me a staff message detailing everything about this. Will await message

## 2013-09-30 NOTE — Telephone Encounter (Signed)
Staff message sent to [facility] to see what is needed

## 2013-09-30 NOTE — Telephone Encounter (Signed)
Staff message sent to [facility]. Pt aware we will call her with update

## 2013-09-30 NOTE — Telephone Encounter (Signed)
Patient's cpap should have been picked up due to noncompliance. Patient will need to have new office visit with VS and new sleep test and be compliant for 30 days before she can get a new cpap.  Joann Gutierrez is going to send a staff message with more detail.

## 2013-09-30 NOTE — Telephone Encounter (Signed)
Per staff message from [place] w/ [facility]:  I got an order today from Dr. Halford Chessman to check the patient's CPAP as she states it is broken. It is not. Here is the story.   Patient rec'd CPAP from [facility] 02/20/2010. She was non compliant, but for some reason, we never picked up her machine, so she still has the machine that she only used for a short period of time. [facility] made 3 payments to [place]. We checked her machine on 09/23/13 and the CPAP itself is working, however, the humidifier, is shorting out the CPAP, so that it does not work while it is attached. It was explained to the patient that due to non compliance, we cannot provide her with any equipment until she starts the process over again.   Because she is MCR, we have to provide her with working equipment until she would be eligible for a replacement, which would not be until 2017, BUT since she was non compliant and if had we picked up the equipment, she would have had to start over before we provided anything.   So, bottom line.she needs to have a new OV with Dr. Halford Chessman, have a new diagnostic sleep study done and then be compliant for 30 days (which she has 90 days to do) and then we will provide her with a humidifier for the CPAP that works fine. Once she becomes compliant, we will provide the humidifier and then bill [facility] for the remaining payments. She will not get a new machine or new humidifier.she will continue to use the CPAP she has and we will provide a gently used humidifier. [facility] does not require [place] to provide her with new equipment, only working equipment    Please advise Dr. Halford Chessman thanks

## 2013-10-01 DIAGNOSIS — R413 Other amnesia: Secondary | ICD-10-CM | POA: Diagnosis not present

## 2013-10-01 DIAGNOSIS — R269 Unspecified abnormalities of gait and mobility: Secondary | ICD-10-CM | POA: Diagnosis not present

## 2013-10-04 DIAGNOSIS — F0390 Unspecified dementia without behavioral disturbance: Secondary | ICD-10-CM | POA: Diagnosis not present

## 2013-10-04 DIAGNOSIS — E119 Type 2 diabetes mellitus without complications: Secondary | ICD-10-CM | POA: Diagnosis not present

## 2013-10-04 DIAGNOSIS — I129 Hypertensive chronic kidney disease with stage 1 through stage 4 chronic kidney disease, or unspecified chronic kidney disease: Secondary | ICD-10-CM | POA: Diagnosis not present

## 2013-10-04 DIAGNOSIS — N183 Chronic kidney disease, stage 3 unspecified: Secondary | ICD-10-CM | POA: Diagnosis not present

## 2013-10-04 DIAGNOSIS — K8020 Calculus of gallbladder without cholecystitis without obstruction: Secondary | ICD-10-CM | POA: Diagnosis not present

## 2013-10-04 DIAGNOSIS — R269 Unspecified abnormalities of gait and mobility: Secondary | ICD-10-CM | POA: Diagnosis not present

## 2013-10-06 ENCOUNTER — Ambulatory Visit (INDEPENDENT_AMBULATORY_CARE_PROVIDER_SITE_OTHER): Payer: Medicare Other | Admitting: Podiatry

## 2013-10-06 ENCOUNTER — Encounter: Payer: Self-pay | Admitting: Podiatry

## 2013-10-06 VITALS — BP 162/75 | HR 86 | Ht 66.5 in | Wt 261.0 lb

## 2013-10-06 DIAGNOSIS — M79609 Pain in unspecified limb: Secondary | ICD-10-CM | POA: Diagnosis not present

## 2013-10-06 DIAGNOSIS — M79606 Pain in leg, unspecified: Secondary | ICD-10-CM

## 2013-10-06 DIAGNOSIS — B351 Tinea unguium: Secondary | ICD-10-CM | POA: Diagnosis not present

## 2013-10-06 NOTE — Patient Instructions (Signed)
Seen for hypertrophic nails. All nails debrided. Return in 3 months or as needed.  

## 2013-10-06 NOTE — Telephone Encounter (Signed)
lmomtcb for pt 

## 2013-10-06 NOTE — Progress Notes (Signed)
Subjective:  68 y.o. year old diabetic female patient presents complaining of painful nails. Patient requests toe nails trimmed.   Objective: Dermatologic:  Hypertrophic nails x 10.  No plantar lesions, no abnormal skin lesions noted.  Vascular:  All pedal pulses are palpable bilateral.  No pedal edema noted.  Orthopedic:  Mild hallux valgus with bunion on left foot.  Neurologic:  All epicritic and tactile sensations grossly intact.   Assessment:  Dystrophic mycotic nails x 10.  IDDM, under control.  Painful feet.   Treatment: All mycotic nails debrided.  Return in 3 months or as needed.

## 2013-10-06 NOTE — Telephone Encounter (Signed)
Just do whatever the insurance company will require and pay for, and whatever the DME says they want.  My medical opinion about whether any of this is necessary has no bearing on decisions made by her insurance and DME.  Please inform patient that additional ROV and testing are being mandate by her insurance and DME.  If she has issues with need for these, then she needs to discuss with her DME and insurance.    Her ROV can be with me or Tammy Parrett to document that she has symptoms, physical findings, and comorbid conditions consistent with her already know diagnosis of obstructive sleep apnea.  She will then need repeat in lab sleep study after ROV.  Will then re-order CPAP after this is done.

## 2013-10-07 DIAGNOSIS — R269 Unspecified abnormalities of gait and mobility: Secondary | ICD-10-CM | POA: Diagnosis not present

## 2013-10-07 DIAGNOSIS — F0390 Unspecified dementia without behavioral disturbance: Secondary | ICD-10-CM | POA: Diagnosis not present

## 2013-10-07 DIAGNOSIS — K8020 Calculus of gallbladder without cholecystitis without obstruction: Secondary | ICD-10-CM | POA: Diagnosis not present

## 2013-10-07 DIAGNOSIS — E119 Type 2 diabetes mellitus without complications: Secondary | ICD-10-CM | POA: Diagnosis not present

## 2013-10-07 DIAGNOSIS — I129 Hypertensive chronic kidney disease with stage 1 through stage 4 chronic kidney disease, or unspecified chronic kidney disease: Secondary | ICD-10-CM | POA: Diagnosis not present

## 2013-10-07 DIAGNOSIS — N183 Chronic kidney disease, stage 3 unspecified: Secondary | ICD-10-CM | POA: Diagnosis not present

## 2013-10-07 NOTE — Telephone Encounter (Signed)
ATC patient-was told she was resting at this time and would have to call the office back.

## 2013-10-11 NOTE — Telephone Encounter (Signed)
Called and spoke with pt and scheduled appt with TP on Wednesday.

## 2013-10-13 ENCOUNTER — Ambulatory Visit (INDEPENDENT_AMBULATORY_CARE_PROVIDER_SITE_OTHER): Payer: Medicare Other | Admitting: Adult Health

## 2013-10-13 ENCOUNTER — Encounter: Payer: Self-pay | Admitting: Adult Health

## 2013-10-13 VITALS — BP 120/70 | HR 68 | Temp 98.5°F | Ht 64.0 in | Wt 262.0 lb

## 2013-10-13 DIAGNOSIS — G4733 Obstructive sleep apnea (adult) (pediatric): Secondary | ICD-10-CM | POA: Diagnosis not present

## 2013-10-13 NOTE — Progress Notes (Signed)
Subjective:    Patient ID: Joann Gutierrez, female    DOB: January 17, 1946, 68 y.o.   MRN: [id number]  HPI with COPD/asthma, OSA, OHS, and rt hemidiaphragm elevation first noted November 2011.  10/13/2013 Follow up OSA  Pt returns for OSA follow up .  Pt has been on nocturnal CPAP however machine is not working properly. She has hx of noncompliance.  DME company can not replace or fix machine due to Medicare guidelines and non payment due to noncompliance.  According to DME  >Pt will need new machine but in order to get this she needs a new diagnostic sleep study done and then be compliant for 30 days (which she has 90 days to do) and then we will provide her with a humidifier for the CPAP that works fine. Once she becomes compliant, they will provide the humidifier and then bill [employer] for the remaining payments.   Pt assures me she is wearing her CPAP everynight , all night for ~6 hr .  She complains of snoring ,daytime sleepiness, and trouble sleeping without her CPAP .  She has trouble losing weight w/ BMI ~44.  Previous sleep study > PSG 04/28/09 >> AHI 11, SpO2 low 68%  TESTS: PSG 04/28/09 >> AHI 11, SpO2 low 68% Spirometry 07/05/10 >> FEV1 1.30 (64%), FEV1% 80 >> coughing during test Echo 05/21/11 >> EF 55 to 60%, mild LVH, mild/mod TR, PAS 45 mmHg ONO with CPAP and room air 10/22/12 >> Test time 10 hrs 59 min. Mean SpO2 92.4%, low SpO2 75%. Spent 16 min with SpO2 < 89%. RAST 12/11/12 >> negative, IgE 63.3 Echo 02/04/13 >> mod LVH, EF 65 to [id number], grade 1 diastolic dysfx, PAS 42 mmHg Lt, Rt Heart cath 03/03/13 >> RA 10, RV 38/7, PA 41/16 28, PCWP 13, LV 142/5, AO 137/76 PFT 03/17/13 >> FEV1 1.27 (67%), FEV1% 84, TLC 4.06 (80%), DLCO 57%, borderline BD   Past Medical History  Diagnosis Date  . Allergy   . Depression   . Diabetes mellitus 1997    Type II   . GERD (gastroesophageal reflux disease)   . Hypertension   . Hyperlipidemia   . Osteoarthritis   . Osteoporosis   . Hair loss   .  Anemia   . CAD (coronary artery disease)     Mild very minimal coronary disease with 20% obtuse marginal stenosis  . CVA (cerebral infarction)   . Diverticulosis   . Esophageal stricture   . Gastritis   . Adenomatous colon polyp   . Sickle cell trait   . Asthma        . COPD (chronic obstructive pulmonary disease)   . 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 Gastroparesis 08/21/2007  . RENAL INSUFFICIENCY 02/16/2009  . OSTEOARTHRITIS 08/09/2006  . Morbid obesity   . CHF (congestive heart failure)   . Carpal tunnel syndrome on left   . 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 Hernia, hiatal   . Seizures     pt thinks it has been several monthes since she had a seisure  . Stroke   . Hearing loss of both ears   . 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 Chronic kidney disease (CKD), stage III (moderate)      Review of Systems Constitutional:   No  weight loss, night sweats,  Fevers, chills, +fatigue, or  lassitude.  HEENT:   No headaches,  Difficulty swallowing,  Tooth/dental problems, or  Sore throat,  No sneezing, itching, ear ache, nasal congestion, post nasal drip,   CV:  No chest pain,  Orthopnea, PND, swelling in lower extremities, anasarca, dizziness, palpitations, syncope.   GI  No heartburn, indigestion, abdominal pain, nausea, vomiting, diarrhea, change in bowel habits, loss of appetite, bloody stools.   Resp:    No excess mucus, no productive cough,  No non-productive cough,  No coughing up of blood.  No change in color of mucus.  No wheezing.  No chest wall deformity  Skin: no rash or lesions.  GU: no dysuria, change in color of urine, no urgency or frequency.  No flank pain, no hematuria   MS:  No joint pain or swelling.  No decreased range of motion.  No back pain.  Psych:  No change in mood or affect. No depression or anxiety.  No memory loss.         Objective:   Physical Exam GEN: A/Ox3; pleasant , NAD, obese   HEENT:   [place]/AT,  EACs-clear, TMs-wnl, NOSE-clear, THROAT-clear, no lesions, no postnasal drip or exudate noted.   NECK:  Supple w/ fair ROM; no JVD; normal carotid impulses w/o bruits; no thyromegaly or nodules palpated; no lymphadenopathy.  RESP  Clear  P & A; w/o, wheezes/ rales/ or rhonchi.no accessory muscle use, no dullness to percussion  CARD:  RRR, no m/r/g  , tr  peripheral edema, pulses intact, no cyanosis or clubbing.  GI:   Soft & nt; nml bowel sounds; no organomegaly or masses detected.  Musco: Warm bil, no deformities or joint swelling noted.   Neuro: alert, no focal deficits noted.    Skin: Warm, no lesions or rashes         Assessment & Plan:

## 2013-10-13 NOTE — Patient Instructions (Signed)
We are setting you up for Sleep Study to check for sleep apnea to see if you qualify for a new CPAP machine.  Continue on work on weight loss.  Follow up Dr. Halford Chessman in 4 weeks to discuss results.

## 2013-10-13 NOTE — Assessment & Plan Note (Signed)
OSA on CPAP w/ broken machine at home  She has symptoms of OSA w/ daytime sleepiness, snoring , obesity and poor sleep .  Will need new dx sleep study to meet DME/Medicare guidelines for dx of OSA . To requalify for CPAP along with compliance with CPAP machine for [facility] payment Pt has been explained these details and is in aggreement.   Plan  We are setting you up for Sleep Study to check for sleep apnea to see if you qualify for a new CPAP machine.  Continue on work on weight loss.  Follow up Dr. Halford Chessman in 4 weeks to discuss results.

## 2013-10-13 NOTE — Progress Notes (Signed)
Reviewed and agree with assessment/plan. 

## 2013-10-22 DIAGNOSIS — G308 Other Alzheimer's disease: Secondary | ICD-10-CM | POA: Diagnosis not present

## 2013-10-22 DIAGNOSIS — E782 Mixed hyperlipidemia: Secondary | ICD-10-CM | POA: Diagnosis not present

## 2013-10-22 DIAGNOSIS — I10 Essential (primary) hypertension: Secondary | ICD-10-CM | POA: Diagnosis not present

## 2013-10-22 DIAGNOSIS — N183 Chronic kidney disease, stage 3 (moderate): Secondary | ICD-10-CM | POA: Diagnosis not present

## 2013-10-22 DIAGNOSIS — E1322 Other specified diabetes mellitus with diabetic chronic kidney disease: Secondary | ICD-10-CM | POA: Diagnosis not present

## 2013-10-28 ENCOUNTER — Other Ambulatory Visit: Payer: Self-pay | Admitting: Cardiology

## 2013-12-02 ENCOUNTER — Other Ambulatory Visit: Payer: Self-pay

## 2013-12-02 MED ORDER — NITROGLYCERIN 0.4 MG/SPRAY TL SOLN: 1.0000 | Status: DC | PRN

## 2013-12-19 ENCOUNTER — Ambulatory Visit (HOSPITAL_BASED_OUTPATIENT_CLINIC_OR_DEPARTMENT_OTHER): Payer: Medicare Other | Attending: Pulmonary Disease

## 2013-12-19 DIAGNOSIS — G4733 Obstructive sleep apnea (adult) (pediatric): Secondary | ICD-10-CM | POA: Diagnosis not present

## 2013-12-19 DIAGNOSIS — J45909 Unspecified asthma, uncomplicated: Secondary | ICD-10-CM | POA: Insufficient documentation

## 2013-12-19 DIAGNOSIS — Z79899 Other long term (current) drug therapy: Secondary | ICD-10-CM | POA: Diagnosis not present

## 2013-12-19 DIAGNOSIS — Z794 Long term (current) use of insulin: Secondary | ICD-10-CM | POA: Insufficient documentation

## 2013-12-19 DIAGNOSIS — G4710 Hypersomnia, unspecified: Secondary | ICD-10-CM | POA: Diagnosis not present

## 2013-12-19 DIAGNOSIS — Z7951 Long term (current) use of inhaled steroids: Secondary | ICD-10-CM | POA: Insufficient documentation

## 2013-12-19 DIAGNOSIS — J449 Chronic obstructive pulmonary disease, unspecified: Secondary | ICD-10-CM | POA: Diagnosis not present

## 2013-12-19 DIAGNOSIS — R0683 Snoring: Secondary | ICD-10-CM | POA: Insufficient documentation

## 2013-12-19 DIAGNOSIS — Z6841 Body Mass Index (BMI) 40.0 and over, adult: Secondary | ICD-10-CM | POA: Diagnosis not present

## 2013-12-19 DIAGNOSIS — J9610 Chronic respiratory failure, unspecified whether with hypoxia or hypercapnia: Secondary | ICD-10-CM | POA: Insufficient documentation

## 2013-12-19 DIAGNOSIS — Z7982 Long term (current) use of aspirin: Secondary | ICD-10-CM | POA: Insufficient documentation

## 2013-12-20 ENCOUNTER — Telehealth: Payer: Self-pay | Admitting: Pulmonary Disease

## 2013-12-20 DIAGNOSIS — G4733 Obstructive sleep apnea (adult) (pediatric): Secondary | ICD-10-CM

## 2013-12-20 NOTE — Telephone Encounter (Signed)
Called spoke with pt. She had sleep study done 12/19/13. Pt aware can take up to 2 weeks for results.  Please advise VS thanks

## 2013-12-23 DIAGNOSIS — G4733 Obstructive sleep apnea (adult) (pediatric): Secondary | ICD-10-CM | POA: Diagnosis not present

## 2013-12-23 NOTE — Telephone Encounter (Signed)
PSG 12/19/13 >> AHI 8.1, SaO2 low 79%, 37.2 min with SaO2 < 88%  Please inform pt that sleep study shows mild sleep apnea.  Please ensure that her DME has her set up for CPAP 9 cm H2O with heated humidity.  She needs to continue using 2 liters oxygen with CPAP.  Please schedule her for ROV in 2 months after she gets CPAP supplies.

## 2013-12-23 NOTE — Sleep Study (Signed)
[place]  NAME: [place] OF BIRTH:  Apr 15, 1945 MEDICAL RECORD NUMBER [id number]  LOCATION: [facility]  PHYSICIAN: [name], M.D. DATE OF STUDY: 12/19/2013  SLEEP STUDY TYPE: Polysomnogram               REFERRING PHYSICIAN: [name], MD  INDICATION FOR STUDY:  Joann Gutierrez is a 68 y.o. female who presents to the sleep lab for evaluation of hypersomnia with obstructive sleep apnea.  She reports snoring, sleep disruption, apnea, and daytime sleepiness.  She has a history of COPD/asthma, right hemidiaphragm elevation and chronic respiratory failure.  She has prior history of sleep apnea, and had been on CPAP with 2 liters supplemental oxygen.  Her DME determined that she was non-compliant with therapy for period of time in 2012, and as a result she was required by insurance provider to have repeat sleep study now in order to continue CPAP therapy.  EPWORTH SLEEPINESS SCORE: 21. HEIGHT: 5\' 4"  WEIGHT: 274  BMI: 46  NECK SIZE: 14 in.  MEDICATIONS:  Current Outpatient Prescriptions on File Prior to Visit  Medication Sig Dispense Refill  . albuterol (PROVENTIL) (2.5 MG/3ML) 0.083% nebulizer solution Take 2.5 mg by nebulization every 6 (six) hours as needed for wheezing or shortness of breath.    Marland Kitchen albuterol (VENTOLIN HFA) 108 (90 BASE) MCG/ACT inhaler Inhale 2 puffs into the lungs every 6 (six) hours as needed for shortness of breath. 1 Inhaler 6  . aspirin 325 MG tablet Take 325 mg by mouth daily.    Marland Kitchen atorvastatin (LIPITOR) 40 MG tablet Take 1 tablet (40 mg total) by mouth daily. 30 tablet 6  . budesonide-formoterol (SYMBICORT) 160-4.5 MCG/ACT inhaler Inhale 2 puffs into the lungs 2 (two) times daily. 1 Inhaler 6  . Calcium Carbonate-Vit D-Min (CALTRATE PLUS PO) Take 600 mg by mouth daily.      . citalopram (CELEXA) 20 MG tablet Take 20 mg by mouth daily.     . cyclobenzaprine (FLEXERIL) 10 MG tablet Take 1 tablet (10  mg total) by mouth daily. 30 tablet 5  . diclofenac (VOLTAREN) 75 MG EC tablet Take 1 tablet by mouth Daily.    Marland Kitchen diltiazem (CARDIZEM CD) 240 MG 24 hr capsule Take 240 mg by mouth daily.      Marland Kitchen donepezil (ARICEPT) 10 MG tablet Take 10 mg by mouth at bedtime.    Marland Kitchen ezetimibe (ZETIA) 10 MG tablet Take 10 mg by mouth daily.      . fluticasone (FLONASE) 50 MCG/ACT nasal spray Place 2 sprays into the nose daily. 16 g 5  . fluticasone (FLONASE) 50 MCG/ACT nasal spray Place 2 sprays into both nostrils daily.    . furosemide (LASIX) 40 MG tablet Take 1 tablet (40 mg total) by mouth 2 (two) times daily. 60 tablet 0  . gabapentin (NEURONTIN) 300 MG capsule Take 1 capsule (300 mg total) by mouth 2 (two) times daily. 60 capsule 5  . hydrALAZINE (APRESOLINE) 100 MG tablet Take 100 mg by mouth 3 (three) times daily.    . insulin glargine (LANTUS) 100 UNIT/ML injection Inject 0.15 mLs (15 Units total) into the skin daily. 10 mL 11  . insulin NPH (HUMULIN N) 100 UNIT/ML injection Inject 2-13 Units into the skin 3 (three) times daily after meals. Sliding scale with each meal    . isosorbide mononitrate (IMDUR) 120 MG 24 hr tablet TAKE ONE (1) TABLET BY MOUTH EVERY DAY 30 tablet 11  .  KLOR-CON M20 20 MEQ tablet Take 1 tablet by mouth Daily.    Marland Kitchen LIDODERM 5 % Place 1 patch onto the skin daily. As needed for pain    . metoCLOPramide (REGLAN) 10 MG tablet Take 1 tablet (10 mg total) by mouth 4 (four) times daily. 360 tablet 3  . montelukast (SINGULAIR) 10 MG tablet Take 1 tablet (10 mg total) by mouth daily. 30 tablet 6  . Multiple Vitamin (MULTIVITAMIN PO) Take 1 tablet by mouth daily.      . nitroGLYCERIN (NITROLINGUAL) 0.4 MG/SPRAY spray Place 1 spray under the tongue every 5 (five) minutes x 3 doses as needed for chest pain. 12 g 0  . omeprazole (PRILOSEC) 20 MG capsule Take 20 mg by mouth daily.    . ondansetron (ZOFRAN) 4 MG tablet Take 1 tablet (4 mg total) by mouth every 8 (eight) hours. 20 tablet 0  .  raloxifene (EVISTA) 60 MG tablet Take 60 mg by mouth daily.      . sucralfate (CARAFATE) 1 GM/10ML suspension Take 10 mLs (1 g total) by mouth 2 (two) times daily. 1260 mL 3  . traMADol (ULTRAM) 50 MG tablet Take 1 tablet (50 mg total) by mouth every 6 (six) hours as needed. 30 tablet 0   No current facility-administered medications on file prior to visit.    SLEEP ARCHITECTURE:  Total recording time: 390.5 minutes.  Total sleep time was: 372 minutes.  Sleep efficiency: 95.3%.  Sleep latency: 11 minutes.  REM latency: 137 minutes.  Stage N1: 0.8%.  Stage N2: 75.3%.  Stage N3: 0%.  Stage R:  23.9%.  Supine sleep: 325.5 minutes.  Non-supine sleep: 46.5 minutes.  CARDIAC DATA:  Average heart rate: 87 beats per minute. Rhythm strip: sinus rhythm with PVC's and PAC's.  RESPIRATORY DATA: Average respiratory rate: 19. Snoring: loud. Average AHI: 8.1.   Apnea index: 1.8.  Hypopnea index: 6.3. Obstructive apnea index: 1.8.  Central apnea index: 0.  Mixed apnea index: 0. REM AHI: 22.9.  NREM AHI: 3.4. Supine AHI: 9. Non-supine AHI: 0.  MOVEMENT/PARASOMNIA:  Periodic limb movement: 0.6.  Period limb movements with arousals: 0. Restroom trips: none.  OXYGEN DATA:  Baseline oxygenation: 94%. Lowest SaO2: 79%. Time spent below SaO2 90%: 105.9 minutes. Supplemental oxygen used: none.  IMPRESSION/ RECOMMENDATION:   This study shows mild obstructive sleep apnea with an AHI of 8.1 and SaO2 low of 79%.  She had a significant REM and supine affect to her sleep apnea.  She had oxygen desaturation in the absence of other respiratory events.  This is suggestive of sleep related hypoventilation/hypoxia.   Additional therapies include weight loss, CPAP, oral appliance, or surgical evaluation.   [name], M.D. Diplomate, Tax adviser of Sleep Medicine  ELECTRONICALLY SIGNED ON:  12/23/2013, 3:18 PM [place] PH: [phone]   FX: [phone] [place]

## 2013-12-24 NOTE — Telephone Encounter (Signed)
Results have been explained to patient, pt expressed understanding.  ROV recall entered.   Called and spoke with [facility], verified CPAP setting and O2 -- RT states that she is due for new mask and supplies. Order placed. Nothing further needed.

## 2013-12-28 DIAGNOSIS — D638 Anemia in other chronic diseases classified elsewhere: Secondary | ICD-10-CM | POA: Diagnosis not present

## 2013-12-28 DIAGNOSIS — N183 Chronic kidney disease, stage 3 (moderate): Secondary | ICD-10-CM | POA: Diagnosis not present

## 2013-12-28 DIAGNOSIS — I10 Essential (primary) hypertension: Secondary | ICD-10-CM | POA: Diagnosis not present

## 2013-12-28 DIAGNOSIS — E889 Metabolic disorder, unspecified: Secondary | ICD-10-CM | POA: Diagnosis not present

## 2013-12-30 ENCOUNTER — Encounter (HOSPITAL_COMMUNITY): Payer: Self-pay | Admitting: Cardiology

## 2013-12-31 DIAGNOSIS — R413 Other amnesia: Secondary | ICD-10-CM | POA: Diagnosis not present

## 2013-12-31 DIAGNOSIS — J45909 Unspecified asthma, uncomplicated: Secondary | ICD-10-CM | POA: Diagnosis not present

## 2013-12-31 DIAGNOSIS — I2510 Atherosclerotic heart disease of native coronary artery without angina pectoris: Secondary | ICD-10-CM | POA: Diagnosis not present

## 2013-12-31 DIAGNOSIS — E1322 Other specified diabetes mellitus with diabetic chronic kidney disease: Secondary | ICD-10-CM | POA: Diagnosis not present

## 2013-12-31 DIAGNOSIS — I10 Essential (primary) hypertension: Secondary | ICD-10-CM | POA: Diagnosis not present

## 2013-12-31 DIAGNOSIS — Z23 Encounter for immunization: Secondary | ICD-10-CM | POA: Diagnosis not present

## 2013-12-31 DIAGNOSIS — N183 Chronic kidney disease, stage 3 (moderate): Secondary | ICD-10-CM | POA: Diagnosis not present

## 2013-12-31 DIAGNOSIS — E780 Pure hypercholesterolemia: Secondary | ICD-10-CM | POA: Diagnosis not present

## 2014-01-05 ENCOUNTER — Encounter: Payer: Self-pay | Admitting: Podiatry

## 2014-01-05 ENCOUNTER — Ambulatory Visit (INDEPENDENT_AMBULATORY_CARE_PROVIDER_SITE_OTHER): Payer: Medicare Other | Admitting: Podiatry

## 2014-01-05 VITALS — Ht 64.0 in | Wt 262.0 lb

## 2014-01-05 DIAGNOSIS — M79606 Pain in leg, unspecified: Secondary | ICD-10-CM | POA: Diagnosis not present

## 2014-01-05 DIAGNOSIS — B351 Tinea unguium: Secondary | ICD-10-CM | POA: Diagnosis not present

## 2014-01-05 NOTE — Patient Instructions (Signed)
Seen for hypertrophic nails. All nails debrided. Return in 3 months or as needed.  

## 2014-01-05 NOTE — Progress Notes (Signed)
Subjective:  68 y.o. year old diabetic female patient presents complaining of painful nails. Patient requests toe nails trimmed.  Her blood sugar was 122 yesterday.   Objective: Dermatologic:  Hypertrophic nails x 10.  No plantar lesions, no abnormal skin lesions noted.  Vascular:  All pedal pulses are palpable bilateral.  No pedal edema noted.  Orthopedic:  Mild hallux valgus with bunion on left foot.  Neurologic:  All epicritic and tactile sensations grossly intact.   Assessment:  Dystrophic mycotic nails x 10.  IDDM, under control.  Painful feet.   Treatment: All mycotic nails debrided.  Return in 3 months or as needed.

## 2014-01-13 ENCOUNTER — Telehealth: Payer: Self-pay | Admitting: Pulmonary Disease

## 2014-01-13 IMAGING — CR DG CHEST 2V
2 series · 2 of 2 positions shown · non-contrast
Comparison: 03/03/2012.

CLINICAL DATA: Shortness of breath, cough, congestion and weakness.

CHEST - 2 VIEW

[view not recorded (1 of 2)]
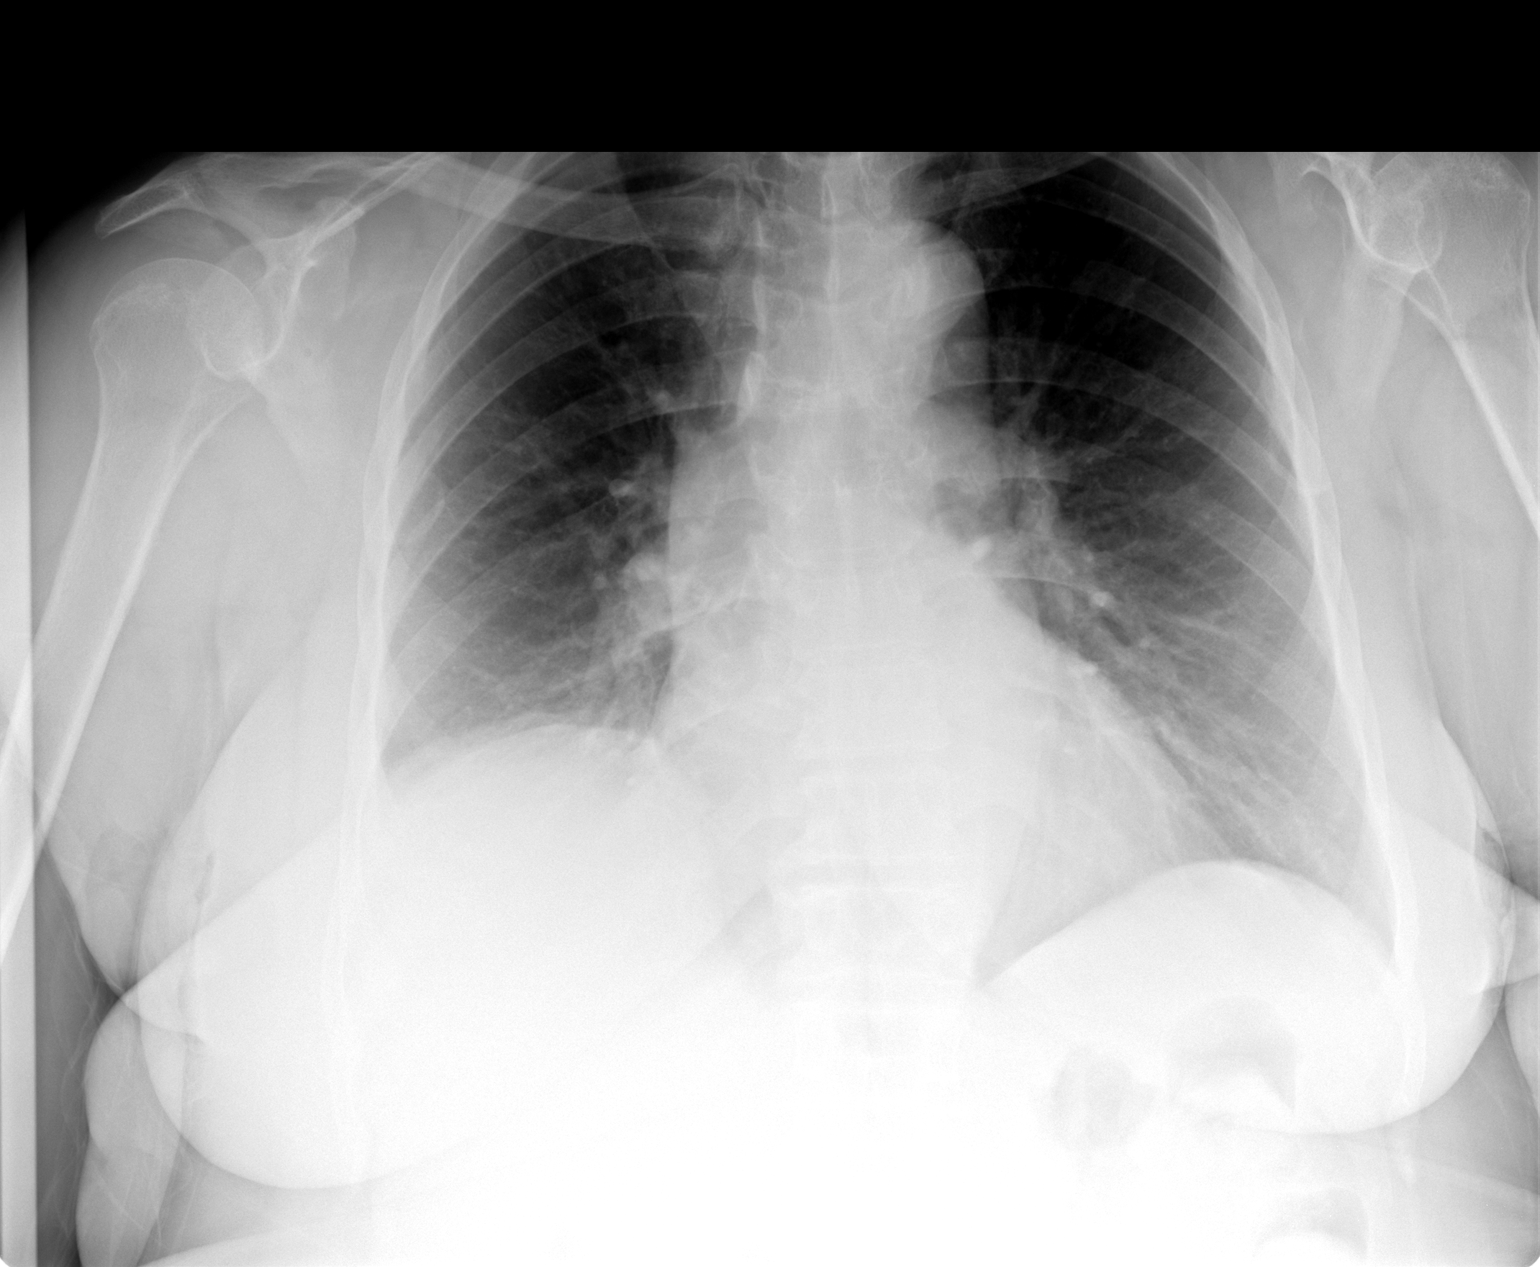

[view not recorded (2 of 2)]
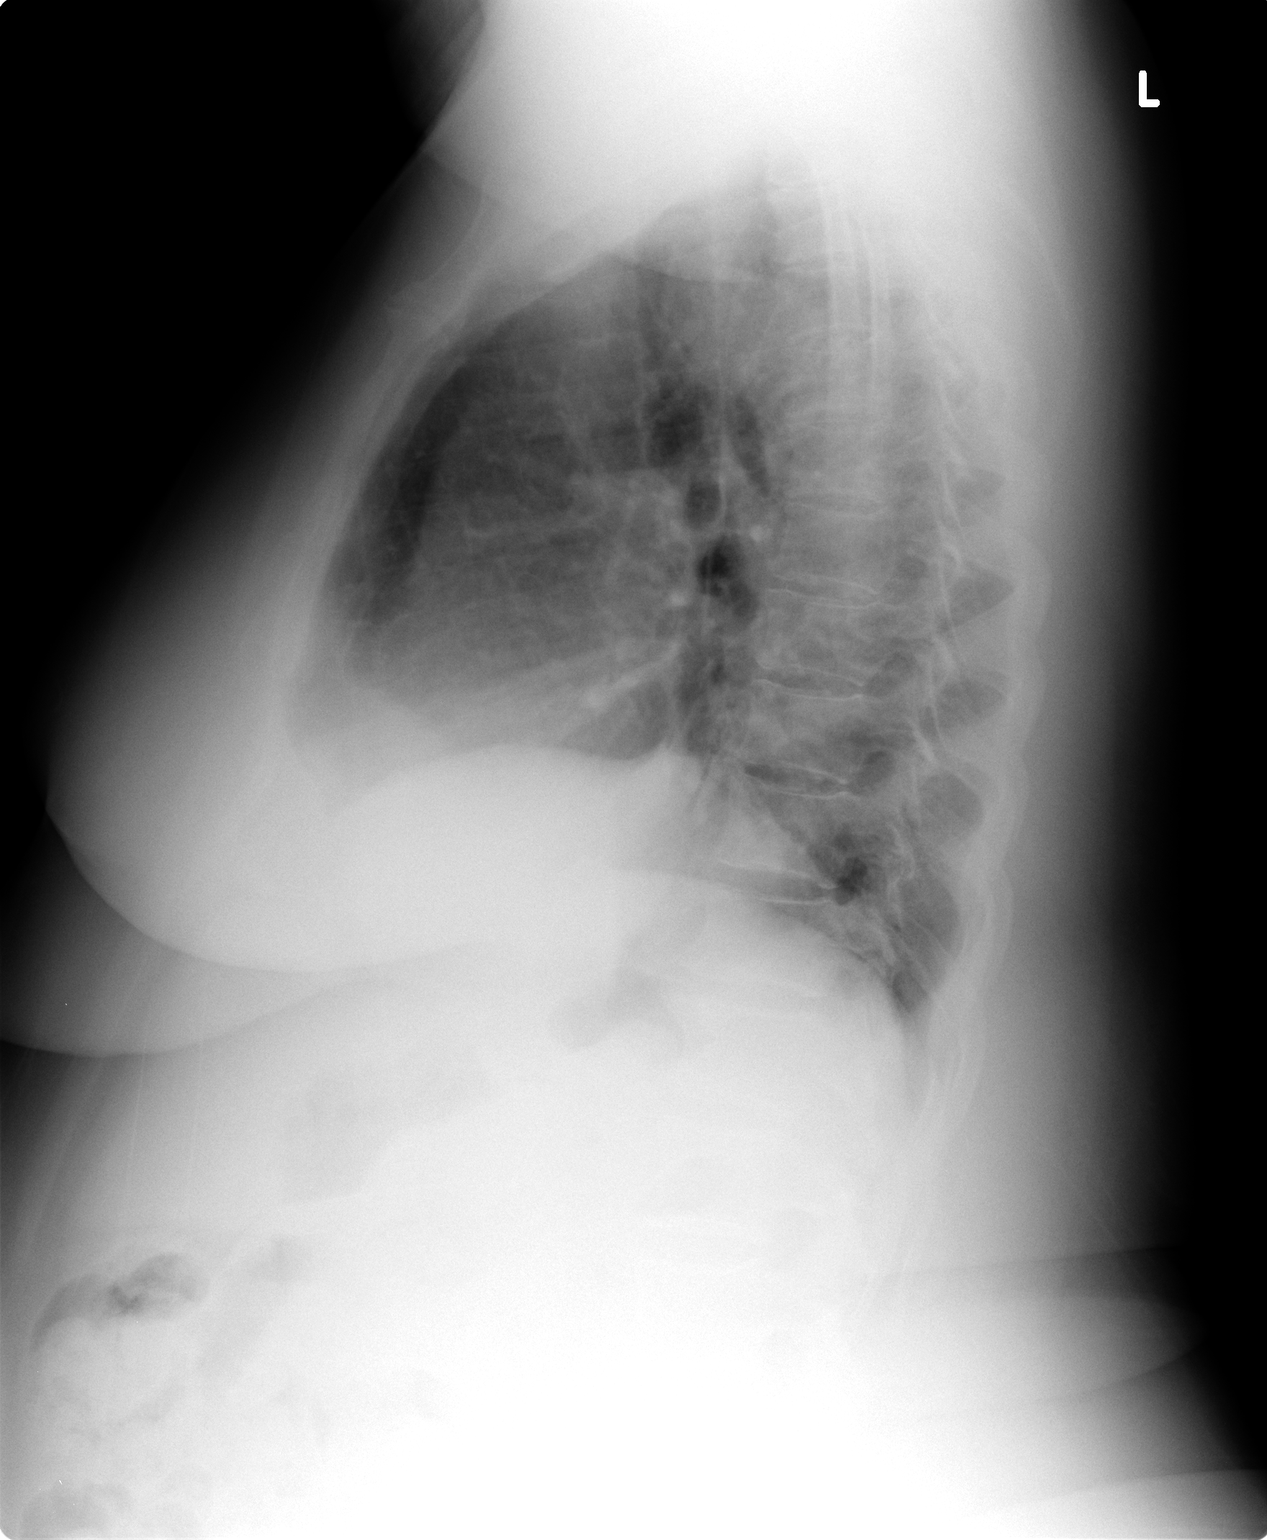

[2 of 2 positions shown; findings below may reference images not displayed]

FINDINGS: Trachea is midline.  Heart size normal.  Lungs are
somewhat low in volume with elevation of the right hemidiaphragm,
as before.  Probable mild right basilar scarring.  Lungs are
otherwise clear.  Minimal left apical pleural thickening,
unchanged.  No pleural fluid.
IMPRESSION: No acute findings.

## 2014-01-13 MED ORDER — DOXYCYCLINE HYCLATE 100 MG PO TABS: 100.0000 mg | ORAL_TABLET | Freq: Two times a day (BID) | ORAL | Status: DC

## 2014-01-13 NOTE — Telephone Encounter (Signed)
Call prescription for doxycycline 100 mg bid, dispense 14 with no refills.

## 2014-01-13 NOTE — Telephone Encounter (Signed)
Called and spoke with Joann Gutierrez and he is aware of rx that has been sent to her pharmacy and nothing further is needed.

## 2014-01-13 NOTE — Telephone Encounter (Signed)
Called and spoke with pts family and he stated that the pt has a cold, cough with green sputum, hoarseness, sorethroat, chills and headache.  Pt is requesting that something be called to the pharmacy for her.  VS please advise. Thanks  Allergies  Allergen Reactions  . Promethazine Hcl     REACTION: lethargy    Current Outpatient Prescriptions on File Prior to Visit  Medication Sig Dispense Refill  . albuterol (PROVENTIL) (2.5 MG/3ML) 0.083% nebulizer solution Take 2.5 mg by nebulization every 6 (six) hours as needed for wheezing or shortness of breath.    Marland Kitchen albuterol (VENTOLIN HFA) 108 (90 BASE) MCG/ACT inhaler Inhale 2 puffs into the lungs every 6 (six) hours as needed for shortness of breath. 1 Inhaler 6  . aspirin 325 MG tablet Take 325 mg by mouth daily.    Marland Kitchen atorvastatin (LIPITOR) 40 MG tablet Take 1 tablet (40 mg total) by mouth daily. 30 tablet 6  . budesonide-formoterol (SYMBICORT) 160-4.5 MCG/ACT inhaler Inhale 2 puffs into the lungs 2 (two) times daily. 1 Inhaler 6  . Calcium Carbonate-Vit D-Min (CALTRATE PLUS PO) Take 600 mg by mouth daily.      . citalopram (CELEXA) 20 MG tablet Take 20 mg by mouth daily.     . cyclobenzaprine (FLEXERIL) 10 MG tablet Take 1 tablet (10 mg total) by mouth daily. 30 tablet 5  . diclofenac (VOLTAREN) 75 MG EC tablet Take 1 tablet by mouth Daily.    Marland Kitchen diltiazem (CARDIZEM CD) 240 MG 24 hr capsule Take 240 mg by mouth daily.      Marland Kitchen donepezil (ARICEPT) 10 MG tablet Take 10 mg by mouth at bedtime.    Marland Kitchen ezetimibe (ZETIA) 10 MG tablet Take 10 mg by mouth daily.      . fluticasone (FLONASE) 50 MCG/ACT nasal spray Place 2 sprays into the nose daily. 16 g 5  . fluticasone (FLONASE) 50 MCG/ACT nasal spray Place 2 sprays into both nostrils daily.    . furosemide (LASIX) 40 MG tablet Take 1 tablet (40 mg total) by mouth 2 (two) times daily. 60 tablet 0  . gabapentin (NEURONTIN) 300 MG capsule Take 1 capsule (300 mg total) by mouth 2 (two) times daily. 60 capsule  5  . hydrALAZINE (APRESOLINE) 100 MG tablet Take 100 mg by mouth 3 (three) times daily.    . insulin glargine (LANTUS) 100 UNIT/ML injection Inject 0.15 mLs (15 Units total) into the skin daily. 10 mL 11  . insulin NPH (HUMULIN N) 100 UNIT/ML injection Inject 2-13 Units into the skin 3 (three) times daily after meals. Sliding scale with each meal    . isosorbide mononitrate (IMDUR) 120 MG 24 hr tablet TAKE ONE (1) TABLET BY MOUTH EVERY DAY 30 tablet 11  . KLOR-CON M20 20 MEQ tablet Take 1 tablet by mouth Daily.    Marland Kitchen LIDODERM 5 % Place 1 patch onto the skin daily. As needed for pain    . metoCLOPramide (REGLAN) 10 MG tablet Take 1 tablet (10 mg total) by mouth 4 (four) times daily. 360 tablet 3  . montelukast (SINGULAIR) 10 MG tablet Take 1 tablet (10 mg total) by mouth daily. 30 tablet 6  . Multiple Vitamin (MULTIVITAMIN PO) Take 1 tablet by mouth daily.      . nitroGLYCERIN (NITROLINGUAL) 0.4 MG/SPRAY spray Place 1 spray under the tongue every 5 (five) minutes x 3 doses as needed for chest pain. 12 g 0  . omeprazole (PRILOSEC) 20 MG capsule Take 20  mg by mouth daily.    . ondansetron (ZOFRAN) 4 MG tablet Take 1 tablet (4 mg total) by mouth every 8 (eight) hours. 20 tablet 0  . raloxifene (EVISTA) 60 MG tablet Take 60 mg by mouth daily.      . sucralfate (CARAFATE) 1 GM/10ML suspension Take 10 mLs (1 g total) by mouth 2 (two) times daily. 1260 mL 3  . traMADol (ULTRAM) 50 MG tablet Take 1 tablet (50 mg total) by mouth every 6 (six) hours as needed. 30 tablet 0   No current facility-administered medications on file prior to visit.

## 2014-02-04 ENCOUNTER — Encounter: Payer: Self-pay | Admitting: Pulmonary Disease

## 2014-02-04 ENCOUNTER — Ambulatory Visit (INDEPENDENT_AMBULATORY_CARE_PROVIDER_SITE_OTHER)
Admission: RE | Admit: 2014-02-04 | Discharge: 2014-02-04 | Disposition: A | Discharge status: Home / Self Care | Payer: Medicare Other | Source: Ambulatory Visit | Attending: Pulmonary Disease | Admitting: Pulmonary Disease

## 2014-02-04 ENCOUNTER — Ambulatory Visit (INDEPENDENT_AMBULATORY_CARE_PROVIDER_SITE_OTHER): Payer: Medicare Other | Admitting: Pulmonary Disease

## 2014-02-04 VITALS — BP 164/94 | HR 75 | Temp 98.3°F | Ht 64.5 in | Wt 272.2 lb

## 2014-02-04 DIAGNOSIS — R0989 Other specified symptoms and signs involving the circulatory and respiratory systems: Secondary | ICD-10-CM | POA: Diagnosis not present

## 2014-02-04 DIAGNOSIS — J449 Chronic obstructive pulmonary disease, unspecified: Secondary | ICD-10-CM | POA: Diagnosis not present

## 2014-02-04 DIAGNOSIS — J441 Chronic obstructive pulmonary disease with (acute) exacerbation: Secondary | ICD-10-CM

## 2014-02-04 DIAGNOSIS — R05 Cough: Secondary | ICD-10-CM | POA: Diagnosis not present

## 2014-02-04 DIAGNOSIS — I509 Heart failure, unspecified: Secondary | ICD-10-CM | POA: Diagnosis not present

## 2014-02-04 MED ORDER — AZITHROMYCIN 250 MG PO TABS: ORAL_TABLET | ORAL | Status: AC

## 2014-02-04 MED ORDER — PREDNISONE 10 MG PO TABS: ORAL_TABLET | ORAL | Status: DC

## 2014-02-04 NOTE — Patient Instructions (Signed)
CXR today Zpak Prednisone 10 mg tabs  Take 2 tabs daily with food x 5ds, then 1 tab daily with food x 5ds then STOP Stay on lasix Call if no better in 1 week

## 2014-02-04 NOTE — Progress Notes (Signed)
   Subjective:    Patient ID: Joann Gutierrez, female    DOB: 07-Jan-1946, 69 y.o.   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   02/04/2014  Chief Complaint  Patient presents with  . Acute Visit    coughing up thick greenish phlegm, chest congestion, chest tightness, PND, sinus pressure    Got cold since dec - given doxy x 10 ds around christmas - voice came back Takes 80 lasix twice daily -I note creatinine of 2.3 in 08/2013 she is wearing her CPAP everynight , all night for ~6 hr .   Chest x-ray-does not show any infiltrates or effusions  TESTS: PSG 04/28/09 >> AHI 11, SpO2 low 68% Spirometry 07/05/10 >> FEV1 1.30 (64%), FEV1% 80 >> coughing during test Echo 05/21/11 >> EF 55 to 60%, mild LVH, mild/mod TR, PAS 45 mmHg ONO with CPAP and room air 10/22/12 >> Test time 10 hrs 59 min. Mean SpO2 92.4%, low SpO2 75%. Spent 16 min with SpO2 < 89%. RAST 12/11/12 >> negative, IgE 63.3 Echo 02/04/13 >> mod LVH, EF 65 to [id number], grade 1 diastolic dysfx, PAS 42 mmHg Lt, Rt Heart cath 03/03/13 >> RA 10, RV 38/7, PA 41/16 28, PCWP 13, LV 142/5, AO 137/76 PFT 03/17/13 >> FEV1 1.27 (67%), FEV1% 84, TLC 4.06 (80%), DLCO 57%, borderline BD  Review of Systems neg for any significant sore throat, dysphagia, itching, sneezing, nasal congestion or excess/ purulent secretions, fever, chills, sweats, unintended wt loss, pleuritic or exertional cp, hempoptysis, orthopnea pnd or change in chronic leg swelling. Also denies presyncope, palpitations, heartburn, abdominal pain, nausea, vomiting, diarrhea or change in bowel or urinary habits, dysuria,hematuria, rash, arthralgias, visual complaints, headache, numbness weakness or ataxia.     Objective:   Physical Exam  Gen. Pleasant, obese, in no distress, normal affect ENT - no lesions, no post nasal drip, class 2-3 airway Neck: No JVD, no thyromegaly, no carotid  bruits Lungs: no use of accessory muscles, no dullness to percussion, left rales, no rhonchi  Cardiovascular: Rhythm regular, heart sounds  normal, no murmurs or gallops, 1+ peripheral edema Abdomen: soft and non-tender, no hepatosplenomegaly, BS normal. Musculoskeletal: No deformities, no cyanosis or clubbing Neuro:  alert, non focal, no tremors       Assessment & Plan:

## 2014-02-04 NOTE — Assessment & Plan Note (Signed)
Treat as acute bronchitis and COPD flare  Zpak Prednisone 10 mg tabs  Take 2 tabs daily with food x 5ds, then 1 tab daily with food x 5ds then STOP Stay on lasix Call if no better in 1 week

## 2014-02-08 ENCOUNTER — Telehealth: Payer: Self-pay | Admitting: Pulmonary Disease

## 2014-02-08 NOTE — Telephone Encounter (Signed)
Spoke with pt and advised of Dr Bari Mantis recommendations.  Pt will finish Zpak and pred taper and will contact our office before the weekend if symptoms are not better.

## 2014-02-08 NOTE — Telephone Encounter (Signed)
Chest x-ray was clear -she already took a round of doxycycline. Complete Zpack Defer to Dr. Halford Chessman - for further instructions, if she remains symptomatic. I saw her for acute office visit

## 2014-02-08 NOTE — Telephone Encounter (Signed)
Patient says that she is coughing up green phlegm, denies fever, but says she has been having a lot of chills.

## 2014-02-21 ENCOUNTER — Encounter (HOSPITAL_COMMUNITY): Payer: Self-pay

## 2014-02-21 NOTE — Progress Notes (Signed)
Reviewed chart per pulmonary rehab follow up.

## 2014-03-01 ENCOUNTER — Other Ambulatory Visit: Payer: Self-pay | Admitting: Obstetrics and Gynecology

## 2014-03-01 DIAGNOSIS — Z124 Encounter for screening for malignant neoplasm of cervix: Secondary | ICD-10-CM | POA: Diagnosis not present

## 2014-03-01 DIAGNOSIS — D649 Anemia, unspecified: Secondary | ICD-10-CM | POA: Diagnosis not present

## 2014-03-01 DIAGNOSIS — Z1231 Encounter for screening mammogram for malignant neoplasm of breast: Secondary | ICD-10-CM | POA: Diagnosis not present

## 2014-03-01 DIAGNOSIS — D509 Iron deficiency anemia, unspecified: Secondary | ICD-10-CM | POA: Diagnosis not present

## 2014-03-01 DIAGNOSIS — Z6841 Body Mass Index (BMI) 40.0 and over, adult: Secondary | ICD-10-CM | POA: Diagnosis not present

## 2014-03-02 LAB — CYTOLOGY - PAP

## 2014-03-04 ENCOUNTER — Encounter: Payer: Self-pay | Admitting: Adult Health

## 2014-03-04 ENCOUNTER — Ambulatory Visit (INDEPENDENT_AMBULATORY_CARE_PROVIDER_SITE_OTHER): Payer: Medicare Other | Admitting: Adult Health

## 2014-03-04 VITALS — BP 134/74 | HR 80 | Temp 98.4°F | Ht 64.0 in | Wt 267.8 lb

## 2014-03-04 DIAGNOSIS — D638 Anemia in other chronic diseases classified elsewhere: Secondary | ICD-10-CM | POA: Diagnosis not present

## 2014-03-04 DIAGNOSIS — G4733 Obstructive sleep apnea (adult) (pediatric): Secondary | ICD-10-CM | POA: Diagnosis not present

## 2014-03-04 DIAGNOSIS — J449 Chronic obstructive pulmonary disease, unspecified: Secondary | ICD-10-CM

## 2014-03-04 DIAGNOSIS — N189 Chronic kidney disease, unspecified: Secondary | ICD-10-CM | POA: Diagnosis not present

## 2014-03-04 MED ORDER — BUDESONIDE-FORMOTEROL FUMARATE 160-4.5 MCG/ACT IN AERO: 2.0000 | INHALATION_SPRAY | Freq: Two times a day (BID) | RESPIRATORY_TRACT | Status: DC

## 2014-03-04 NOTE — Patient Instructions (Signed)
Continue on current regimen Continue C Pap at bedtime Follow-up with Dr. Halford Chessman  in 3 months and as needed

## 2014-03-04 NOTE — Assessment & Plan Note (Signed)
Recent flare, now resolved Continue on current regimen

## 2014-03-04 NOTE — Addendum Note (Signed)
Addended by: Parke Poisson E on: 03/04/2014 04:44 PM   Modules accepted: Orders

## 2014-03-04 NOTE — Progress Notes (Signed)
   Subjective:    Patient ID: Joann Gutierrez, female    DOB: [birth year], 69 y.o.   MRN: [id number]  HPI  69 year old remote smoker with COPD/asthma, OSA, OHS, and rt hemidiaphragm elevation first noted November 2011.   TESTS: PSG 04/28/09 >> AHI 11, SpO2 low 68% Spirometry 07/05/10 >> FEV1 1.30 (64%), FEV1% 80 >> coughing during test Echo 05/21/11 >> EF 55 to 60%, mild LVH, mild/mod TR, PAS 45 mmHg ONO with CPAP and room air 10/22/12 >> Test time 10 hrs 59 min. Mean SpO2 92.4%, low SpO2 75%. Spent 16 min with SpO2 < 89%. RAST 12/11/12 >> negative, IgE 63.3 Echo 02/04/13 >> mod LVH, EF 65 to [id number], grade 1 diastolic dysfx, PAS 42 mmHg Lt, Rt Heart cath 03/03/13 >> RA 10, RV 38/7, PA 41/16 28, PCWP 13, LV 142/5, AO 137/76 PFT 03/17/13 >> FEV1 1.27 (67%), FEV1% 84, TLC 4.06 (80%), DLCO 57%, borderline BD  03/04/2014 Follow up  Patient returns for a two-week follow-up Patient was seen for a COPD flare last visit She was treated with a Z-Pak and a prednisone taper. Chest x-ray showed no acute pneumonia,  with some mild CHF. She was recommended stay on Lasix Overall she does feel better. She remains on C Pap at bedtime, says that she wears all night every night She denies any mask issues.     Review of Systems neg for any significant sore throat, dysphagia, itching, sneezing, nasal congestion or excess/ purulent secretions, fever, chills, sweats, unintended wt loss, pleuritic or exertional cp, hempoptysis, orthopnea pnd or change in chronic leg swelling. Also denies presyncope, palpitations, heartburn, abdominal pain, nausea, vomiting, diarrhea or change in bowel or urinary habits, dysuria,hematuria, rash, arthralgias, visual complaints, headache, numbness weakness or ataxia.     Objective:   Physical Exam  Gen. Pleasant, obese, in no distress, normal affect ENT - no lesions, no post nasal drip, class 2-3 airway Neck: No JVD, no thyromegaly, no carotid bruits Lungs: no use of accessory  muscles, no dullness to percussion, decreased breath sounds in the bases without, wheezes Cardiovascular: Rhythm regular, heart sounds  normal, no murmurs or gallops, trace to 1+  peripheral edema Abdomen: soft and non-tender, no hepatosplenomegaly, BS normal. Musculoskeletal: No deformities, no cyanosis or clubbing Neuro:  alert, non focal, no tremors       Assessment & Plan:

## 2014-03-04 NOTE — Assessment & Plan Note (Signed)
Continue on nocturnal C Pap 

## 2014-03-05 IMAGING — CR DG CHEST 2V
2 series · 2 of 2 positions shown · non-contrast
Comparison: 10/19/2012

CLINICAL DATA: Acute bronchitis, cough, shortness of breath, mid
chest pain for 3 weeks

EXAM:
CHEST  2 VIEW

[w chest pa]
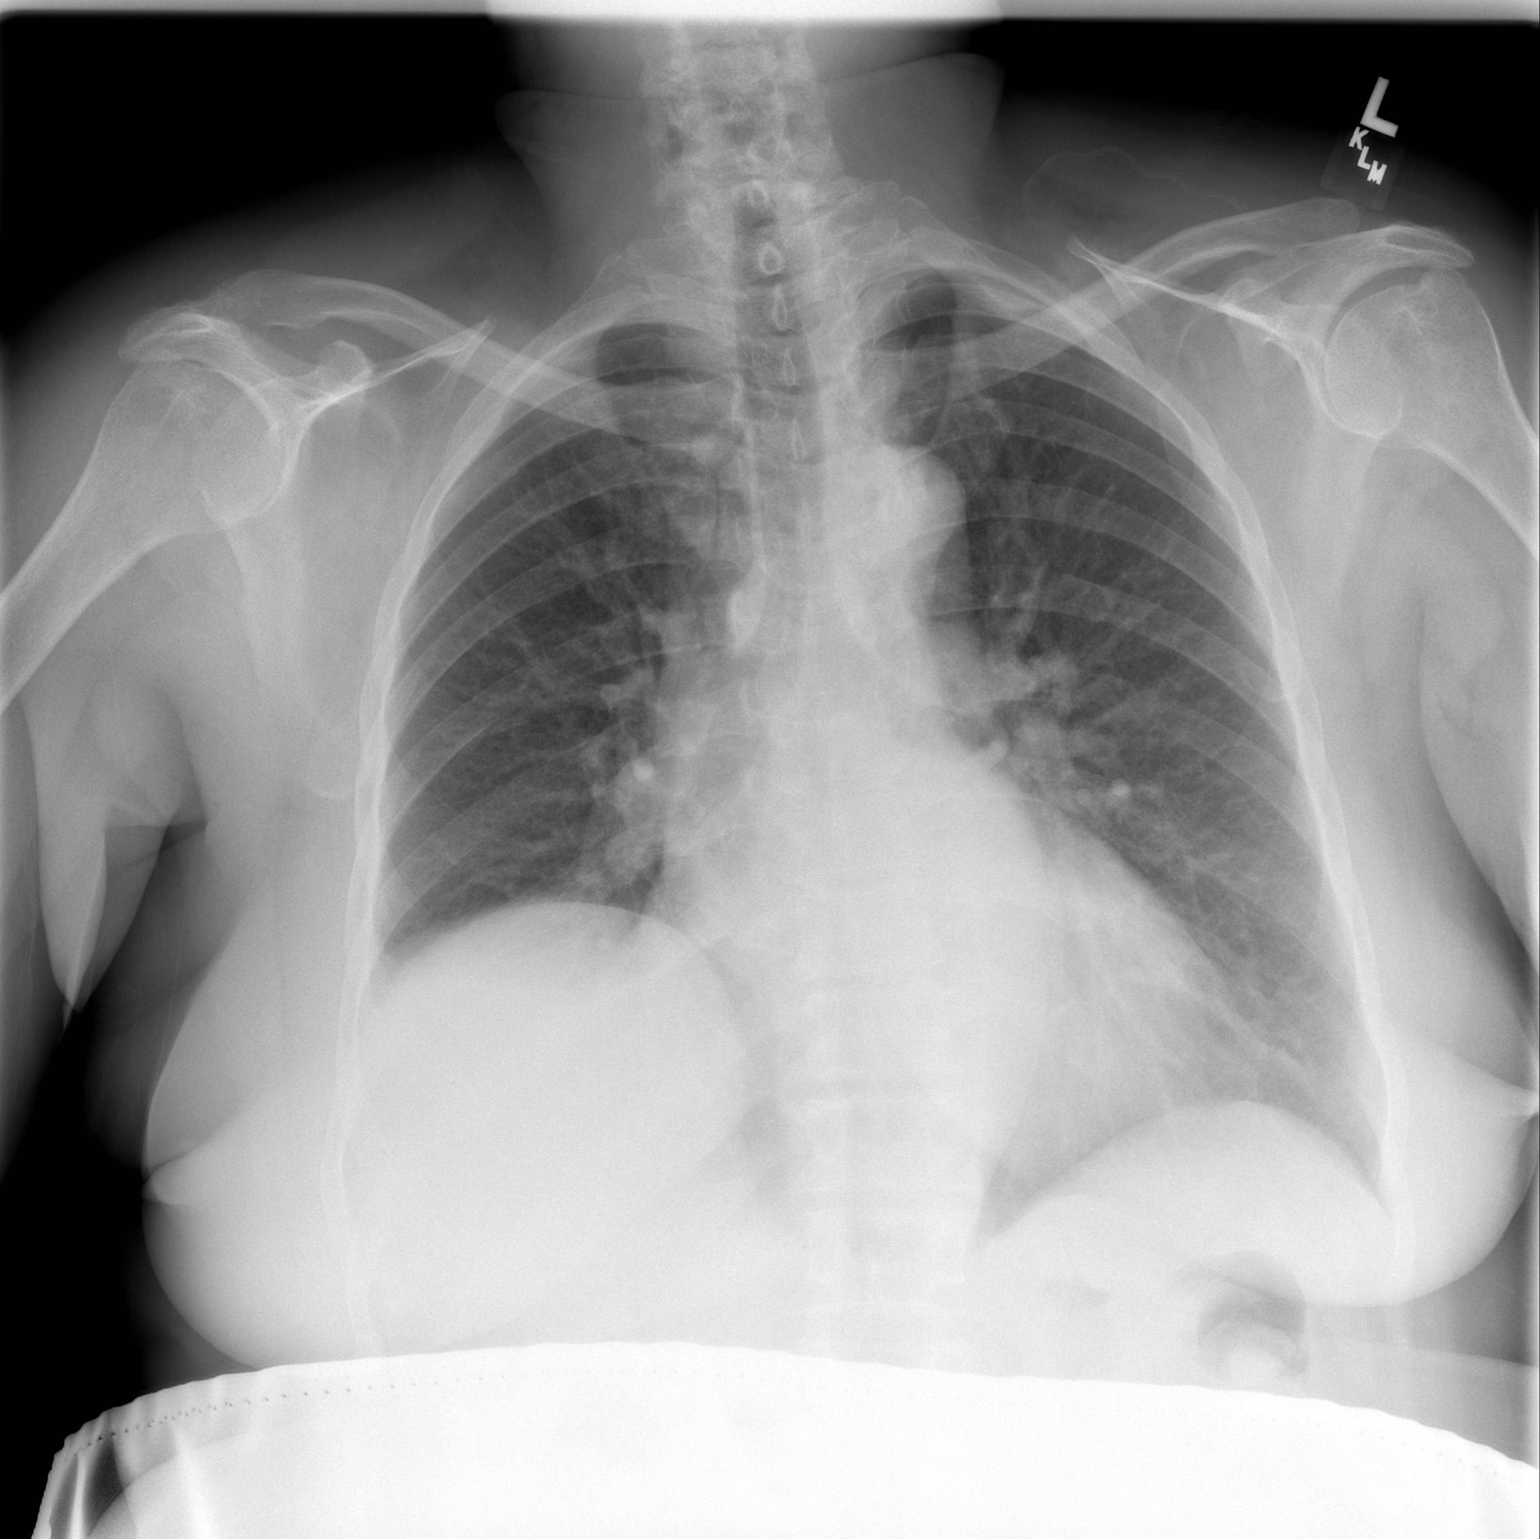

[w chest lat]
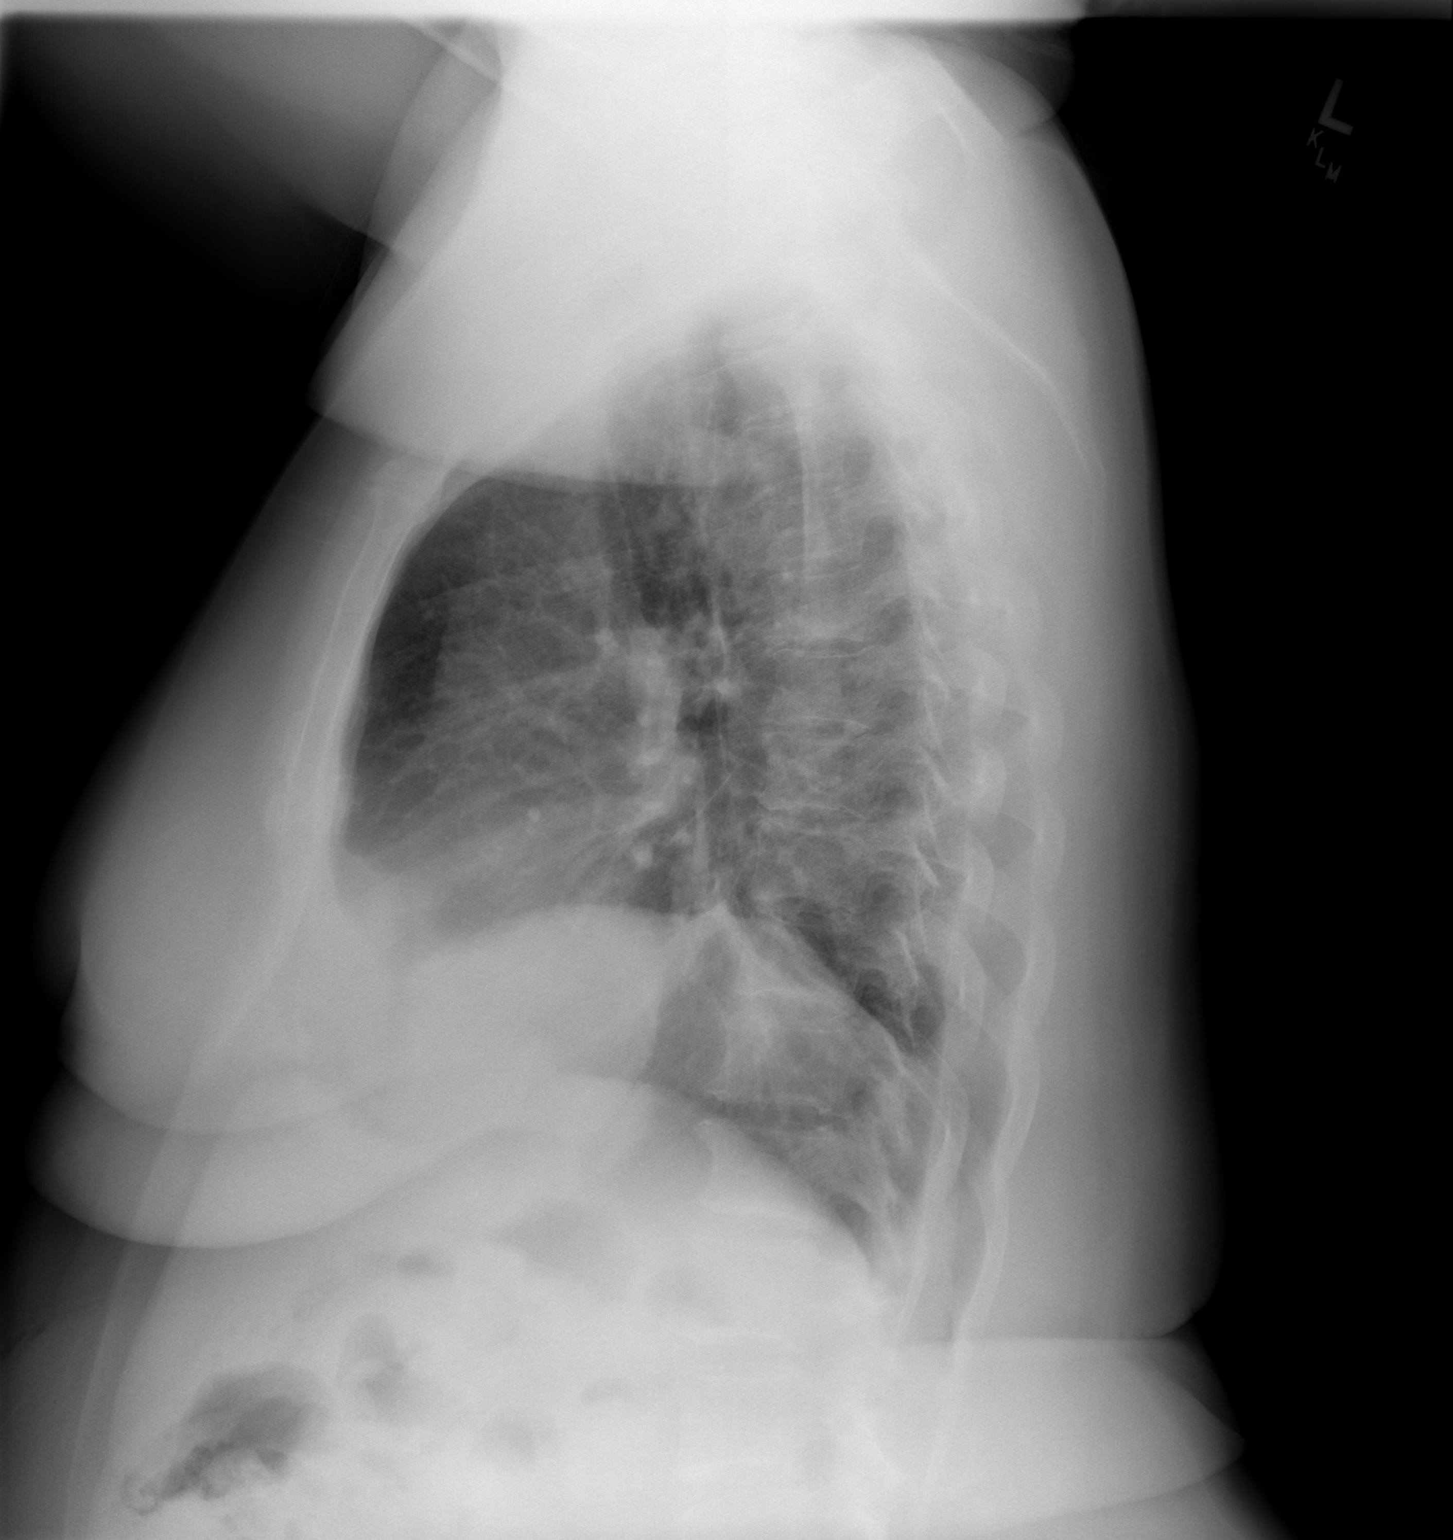

[2 of 2 positions shown; findings below may reference images not displayed]

FINDINGS: Significant elevation of the right diaphragm, stable. Heart size
upper normal. Vascular pattern normal. No infiltrate or
consolidation. No pleural effusion. No change from prior study.
IMPRESSION: No active cardiopulmonary disease.

## 2014-03-07 ENCOUNTER — Encounter: Payer: Self-pay | Admitting: Cardiology

## 2014-03-11 DIAGNOSIS — N183 Chronic kidney disease, stage 3 (moderate): Secondary | ICD-10-CM | POA: Diagnosis not present

## 2014-03-11 DIAGNOSIS — D638 Anemia in other chronic diseases classified elsewhere: Secondary | ICD-10-CM | POA: Diagnosis not present

## 2014-03-18 DIAGNOSIS — D649 Anemia, unspecified: Secondary | ICD-10-CM | POA: Diagnosis not present

## 2014-03-23 DIAGNOSIS — D509 Iron deficiency anemia, unspecified: Secondary | ICD-10-CM | POA: Diagnosis not present

## 2014-03-24 DIAGNOSIS — D638 Anemia in other chronic diseases classified elsewhere: Secondary | ICD-10-CM | POA: Diagnosis not present

## 2014-03-30 NOTE — Discharge Instructions (Signed)

## 2014-03-31 ENCOUNTER — Encounter (HOSPITAL_COMMUNITY)
Admission: RE | Admit: 2014-03-31 | Discharge: 2014-03-31 | Disposition: A | Discharge status: Home / Self Care | Payer: Medicare Other | Source: Ambulatory Visit | Attending: Nephrology | Admitting: Nephrology

## 2014-03-31 DIAGNOSIS — D631 Anemia in chronic kidney disease: Secondary | ICD-10-CM | POA: Insufficient documentation

## 2014-03-31 DIAGNOSIS — N183 Chronic kidney disease, stage 3 (moderate): Secondary | ICD-10-CM | POA: Diagnosis not present

## 2014-03-31 MED ORDER — EPOETIN ALFA 10000 UNIT/ML IJ SOLN: 10000.0000 [IU] | INTRAMUSCULAR | Status: DC

## 2014-04-01 LAB — POCT HEMOGLOBIN-HEMACUE: Hemoglobin: 10.3 g/dL — ABNORMAL LOW (ref 12.0–15.0)

## 2014-04-01 MED ORDER — EPOETIN ALFA 10000 UNIT/ML IJ SOLN
INTRAMUSCULAR | Status: AC
  Administered 2014-03-31: 10000 [IU]
  Filled 2014-04-01: qty 1

## 2014-04-02 ENCOUNTER — Other Ambulatory Visit: Payer: Self-pay | Admitting: Cardiology

## 2014-04-04 NOTE — Telephone Encounter (Signed)
Rx has been sent to the pharmacy electronically. ° °

## 2014-04-06 ENCOUNTER — Encounter: Payer: Self-pay | Admitting: Podiatry

## 2014-04-06 ENCOUNTER — Ambulatory Visit (INDEPENDENT_AMBULATORY_CARE_PROVIDER_SITE_OTHER): Payer: Medicare Other | Admitting: Podiatry

## 2014-04-06 VITALS — Ht 64.0 in | Wt 267.0 lb

## 2014-04-06 DIAGNOSIS — M79606 Pain in leg, unspecified: Secondary | ICD-10-CM

## 2014-04-06 DIAGNOSIS — B351 Tinea unguium: Secondary | ICD-10-CM

## 2014-04-06 NOTE — Progress Notes (Signed)
Subjective: 69 y.o. year old diabetic female patient presents complaining of painful nails. Patient requests toe nails trimmed.   Objective: Dermatologic:  Hypertrophic nails x 10.  No plantar lesions, no abnormal skin lesions noted.  Vascular:  All pedal pulses are palpable bilateral.  No pedal edema noted.  Orthopedic:  Mild hallux valgus with bunion on left foot.  Neurologic:  All epicritic and tactile sensations grossly intact.   Assessment:  Dystrophic mycotic nails x 10.  IDDM, under control.  Painful feet.   Treatment: All mycotic nails debrided.  Return in 3 months or as needed

## 2014-04-07 ENCOUNTER — Encounter (HOSPITAL_COMMUNITY)
Admission: RE | Admit: 2014-04-07 | Discharge: 2014-04-07 | Disposition: A | Discharge status: Home / Self Care | Payer: Medicare Other | Source: Ambulatory Visit | Attending: Nephrology | Admitting: Nephrology

## 2014-04-07 DIAGNOSIS — D631 Anemia in chronic kidney disease: Secondary | ICD-10-CM | POA: Diagnosis not present

## 2014-04-07 DIAGNOSIS — N183 Chronic kidney disease, stage 3 (moderate): Secondary | ICD-10-CM | POA: Diagnosis not present

## 2014-04-07 LAB — POCT HEMOGLOBIN-HEMACUE: Hemoglobin: 9 g/dL — ABNORMAL LOW (ref 12.0–15.0)

## 2014-04-07 MED ORDER — EPOETIN ALFA 10000 UNIT/ML IJ SOLN
INTRAMUSCULAR | Status: AC
  Filled 2014-04-07: qty 1

## 2014-04-07 MED ORDER — EPOETIN ALFA 10000 UNIT/ML IJ SOLN
10000.0000 [IU] | INTRAMUSCULAR | Status: DC
  Administered 2014-04-07: 10000 [IU] via SUBCUTANEOUS

## 2014-04-12 ENCOUNTER — Telehealth: Payer: Self-pay | Admitting: *Deleted

## 2014-04-12 MED ORDER — NITROGLYCERIN 0.4 MG SL SUBL: 0.4000 mg | SUBLINGUAL_TABLET | SUBLINGUAL | Status: DC | PRN

## 2014-04-12 NOTE — Telephone Encounter (Signed)
Spoke with pt, aware of the change from NTG spay to tablets.

## 2014-04-12 NOTE — Telephone Encounter (Signed)
Patient is currently prescribed NTG spray, it is no longer covered by her insurance plan. Will need to change to the SL NTG tablets. Left message for pt to call to discuss

## 2014-04-14 ENCOUNTER — Encounter (HOSPITAL_COMMUNITY)
Admission: RE | Admit: 2014-04-14 | Discharge: 2014-04-14 | Disposition: A | Discharge status: Home / Self Care | Payer: Medicare Other | Source: Ambulatory Visit | Attending: Nephrology | Admitting: Nephrology

## 2014-04-14 DIAGNOSIS — D631 Anemia in chronic kidney disease: Secondary | ICD-10-CM | POA: Diagnosis not present

## 2014-04-14 DIAGNOSIS — N183 Chronic kidney disease, stage 3 (moderate): Secondary | ICD-10-CM | POA: Diagnosis not present

## 2014-04-14 LAB — POCT HEMOGLOBIN-HEMACUE: HEMOGLOBIN: 9.9 g/dL — AB (ref 12.0–15.0)

## 2014-04-14 MED ORDER — EPOETIN ALFA 10000 UNIT/ML IJ SOLN
INTRAMUSCULAR | Status: AC
  Filled 2014-04-14: qty 1

## 2014-04-14 MED ORDER — EPOETIN ALFA 10000 UNIT/ML IJ SOLN
10000.0000 [IU] | INTRAMUSCULAR | Status: DC
  Administered 2014-04-14: 10000 [IU] via SUBCUTANEOUS

## 2014-04-21 ENCOUNTER — Encounter (HOSPITAL_COMMUNITY)
Admission: RE | Admit: 2014-04-21 | Discharge: 2014-04-21 | Disposition: A | Discharge status: Home / Self Care | Payer: Medicare Other | Source: Ambulatory Visit | Attending: Nephrology | Admitting: Nephrology

## 2014-04-21 DIAGNOSIS — N183 Chronic kidney disease, stage 3 (moderate): Secondary | ICD-10-CM | POA: Diagnosis not present

## 2014-04-21 DIAGNOSIS — D631 Anemia in chronic kidney disease: Secondary | ICD-10-CM | POA: Diagnosis not present

## 2014-04-21 DIAGNOSIS — D638 Anemia in other chronic diseases classified elsewhere: Secondary | ICD-10-CM | POA: Diagnosis not present

## 2014-04-21 DIAGNOSIS — E889 Metabolic disorder, unspecified: Secondary | ICD-10-CM | POA: Diagnosis not present

## 2014-04-21 LAB — POCT HEMOGLOBIN-HEMACUE: Hemoglobin: 8.5 g/dL — ABNORMAL LOW (ref 12.0–15.0)

## 2014-04-21 MED ORDER — EPOETIN ALFA 10000 UNIT/ML IJ SOLN
INTRAMUSCULAR | Status: AC
  Filled 2014-04-21: qty 1

## 2014-04-21 MED ORDER — EPOETIN ALFA 10000 UNIT/ML IJ SOLN
10000.0000 [IU] | INTRAMUSCULAR | Status: DC
  Administered 2014-04-21: 10000 [IU] via SUBCUTANEOUS

## 2014-04-25 DIAGNOSIS — M9080 Osteopathy in diseases classified elsewhere, unspecified site: Secondary | ICD-10-CM | POA: Diagnosis not present

## 2014-04-25 DIAGNOSIS — D638 Anemia in other chronic diseases classified elsewhere: Secondary | ICD-10-CM | POA: Diagnosis not present

## 2014-04-25 DIAGNOSIS — N183 Chronic kidney disease, stage 3 (moderate): Secondary | ICD-10-CM | POA: Diagnosis not present

## 2014-04-25 DIAGNOSIS — I10 Essential (primary) hypertension: Secondary | ICD-10-CM | POA: Diagnosis not present

## 2014-04-28 ENCOUNTER — Encounter (HOSPITAL_COMMUNITY)
Admission: RE | Admit: 2014-04-28 | Discharge: 2014-04-28 | Disposition: A | Discharge status: Home / Self Care | Payer: Medicare Other | Source: Ambulatory Visit | Attending: Nephrology | Admitting: Nephrology

## 2014-04-28 DIAGNOSIS — D631 Anemia in chronic kidney disease: Secondary | ICD-10-CM | POA: Diagnosis not present

## 2014-04-28 DIAGNOSIS — N183 Chronic kidney disease, stage 3 (moderate): Secondary | ICD-10-CM | POA: Diagnosis not present

## 2014-04-28 LAB — POCT HEMOGLOBIN-HEMACUE: Hemoglobin: 8.9 g/dL — ABNORMAL LOW (ref 12.0–15.0)

## 2014-04-28 LAB — FERRITIN: Ferritin: 170 ng/mL (ref 10–291)

## 2014-04-28 LAB — IRON AND TIBC
IRON: 50 ug/dL (ref 42–145)
Saturation Ratios: 23 % (ref 20–55)
TIBC: 217 ug/dL — ABNORMAL LOW (ref 250–470)
UIBC: 167 ug/dL (ref 125–400)

## 2014-04-28 MED ORDER — EPOETIN ALFA 10000 UNIT/ML IJ SOLN
10000.0000 [IU] | INTRAMUSCULAR | Status: DC
  Administered 2014-04-28: 10000 [IU] via SUBCUTANEOUS

## 2014-04-28 MED ORDER — EPOETIN ALFA 10000 UNIT/ML IJ SOLN
INTRAMUSCULAR | Status: AC
  Filled 2014-04-28: qty 1

## 2014-05-05 ENCOUNTER — Encounter (HOSPITAL_COMMUNITY)
Admission: RE | Admit: 2014-05-05 | Discharge: 2014-05-05 | Disposition: A | Discharge status: Home / Self Care | Payer: Medicare Other | Source: Ambulatory Visit | Attending: Nephrology | Admitting: Nephrology

## 2014-05-05 DIAGNOSIS — N183 Chronic kidney disease, stage 3 (moderate): Secondary | ICD-10-CM | POA: Diagnosis not present

## 2014-05-05 DIAGNOSIS — D631 Anemia in chronic kidney disease: Secondary | ICD-10-CM | POA: Diagnosis not present

## 2014-05-05 LAB — POCT HEMOGLOBIN-HEMACUE: Hemoglobin: 8.7 g/dL — ABNORMAL LOW (ref 12.0–15.0)

## 2014-05-05 MED ORDER — EPOETIN ALFA 10000 UNIT/ML IJ SOLN
INTRAMUSCULAR | Status: AC
  Filled 2014-05-05: qty 1

## 2014-05-05 MED ORDER — EPOETIN ALFA 10000 UNIT/ML IJ SOLN
10000.0000 [IU] | INTRAMUSCULAR | Status: DC
  Administered 2014-05-05: 10000 [IU] via SUBCUTANEOUS

## 2014-05-12 ENCOUNTER — Encounter (HOSPITAL_COMMUNITY)
Admission: RE | Admit: 2014-05-12 | Discharge: 2014-05-12 | Disposition: A | Discharge status: Home / Self Care | Payer: Medicare Other | Source: Ambulatory Visit | Attending: Nephrology | Admitting: Nephrology

## 2014-05-12 DIAGNOSIS — N183 Chronic kidney disease, stage 3 (moderate): Secondary | ICD-10-CM | POA: Diagnosis not present

## 2014-05-12 DIAGNOSIS — D631 Anemia in chronic kidney disease: Secondary | ICD-10-CM | POA: Diagnosis not present

## 2014-05-12 LAB — POCT HEMOGLOBIN-HEMACUE: Hemoglobin: 9.8 g/dL — ABNORMAL LOW (ref 12.0–15.0)

## 2014-05-12 MED ORDER — EPOETIN ALFA 10000 UNIT/ML IJ SOLN: 10000.0000 [IU] | INTRAMUSCULAR | Status: DC

## 2014-05-12 MED ORDER — EPOETIN ALFA 10000 UNIT/ML IJ SOLN
INTRAMUSCULAR | Status: AC
  Administered 2014-05-12: 10000 [IU] via SUBCUTANEOUS
  Filled 2014-05-12: qty 1

## 2014-05-19 ENCOUNTER — Telehealth: Payer: Self-pay | Admitting: Pulmonary Disease

## 2014-05-19 ENCOUNTER — Encounter (HOSPITAL_COMMUNITY)
Admission: RE | Admit: 2014-05-19 | Discharge: 2014-05-19 | Disposition: A | Discharge status: Home / Self Care | Payer: Medicare Other | Source: Ambulatory Visit | Attending: Nephrology | Admitting: Nephrology

## 2014-05-19 DIAGNOSIS — N183 Chronic kidney disease, stage 3 (moderate): Secondary | ICD-10-CM | POA: Diagnosis not present

## 2014-05-19 DIAGNOSIS — D631 Anemia in chronic kidney disease: Secondary | ICD-10-CM | POA: Diagnosis not present

## 2014-05-19 LAB — POCT HEMOGLOBIN-HEMACUE: HEMOGLOBIN: 9.1 g/dL — AB (ref 12.0–15.0)

## 2014-05-19 MED ORDER — EPOETIN ALFA 10000 UNIT/ML IJ SOLN
10000.0000 [IU] | INTRAMUSCULAR | Status: DC
  Administered 2014-05-19: 10000 [IU] via SUBCUTANEOUS

## 2014-05-19 MED ORDER — EPOETIN ALFA 10000 UNIT/ML IJ SOLN
INTRAMUSCULAR | Status: AC
Start: 2014-05-19 — End: 2014-05-19
  Filled 2014-05-19: qty 1

## 2014-05-19 NOTE — Telephone Encounter (Signed)
Called pt and appt scheduled to see MW tomorrow for acute visit

## 2014-05-20 ENCOUNTER — Encounter: Payer: Self-pay | Admitting: Internal Medicine

## 2014-05-20 ENCOUNTER — Ambulatory Visit (INDEPENDENT_AMBULATORY_CARE_PROVIDER_SITE_OTHER): Payer: Medicare Other | Admitting: Internal Medicine

## 2014-05-20 ENCOUNTER — Ambulatory Visit (INDEPENDENT_AMBULATORY_CARE_PROVIDER_SITE_OTHER)
Admission: RE | Admit: 2014-05-20 | Discharge: 2014-05-20 | Disposition: A | Discharge status: Home / Self Care | Payer: Medicare Other | Source: Ambulatory Visit | Attending: Internal Medicine | Admitting: Internal Medicine

## 2014-05-20 ENCOUNTER — Other Ambulatory Visit (INDEPENDENT_AMBULATORY_CARE_PROVIDER_SITE_OTHER): Payer: Medicare Other

## 2014-05-20 VITALS — BP 160/80 | HR 77 | Temp 99.1°F | Ht 64.0 in | Wt 278.0 lb

## 2014-05-20 DIAGNOSIS — J449 Chronic obstructive pulmonary disease, unspecified: Secondary | ICD-10-CM | POA: Diagnosis not present

## 2014-05-20 DIAGNOSIS — R0600 Dyspnea, unspecified: Secondary | ICD-10-CM

## 2014-05-20 DIAGNOSIS — J45901 Unspecified asthma with (acute) exacerbation: Secondary | ICD-10-CM

## 2014-05-20 DIAGNOSIS — J45909 Unspecified asthma, uncomplicated: Secondary | ICD-10-CM | POA: Insufficient documentation

## 2014-05-20 DIAGNOSIS — D509 Iron deficiency anemia, unspecified: Secondary | ICD-10-CM

## 2014-05-20 DIAGNOSIS — R05 Cough: Secondary | ICD-10-CM | POA: Diagnosis not present

## 2014-05-20 LAB — CBC WITH DIFFERENTIAL/PLATELET
Basophils Absolute: 0 10*3/uL (ref 0.0–0.1)
Basophils Relative: 0.6 % (ref 0.0–3.0)
EOS ABS: 0.1 10*3/uL (ref 0.0–0.7)
Eosinophils Relative: 1.1 % (ref 0.0–5.0)
HCT: 33.3 % — ABNORMAL LOW (ref 36.0–46.0)
Hemoglobin: 10.5 g/dL — ABNORMAL LOW (ref 12.0–15.0)
LYMPHS PCT: 26.4 % (ref 12.0–46.0)
Lymphs Abs: 1.5 10*3/uL (ref 0.7–4.0)
MCHC: 31.7 g/dL (ref 30.0–36.0)
MCV: 73.7 fl — ABNORMAL LOW (ref 78.0–100.0)
MONO ABS: 0.5 10*3/uL (ref 0.1–1.0)
Monocytes Relative: 8.3 % (ref 3.0–12.0)
Neutro Abs: 3.6 10*3/uL (ref 1.4–7.7)
Neutrophils Relative %: 63.6 % (ref 43.0–77.0)
Platelets: 265 10*3/uL (ref 150.0–400.0)
RBC: 4.51 Mil/uL (ref 3.87–5.11)
RDW: 16 % — ABNORMAL HIGH (ref 11.5–15.5)
WBC: 5.7 10*3/uL (ref 4.0–10.5)

## 2014-05-20 LAB — BASIC METABOLIC PANEL
BUN: 31 mg/dL — AB (ref 6–23)
CHLORIDE: 105 meq/L (ref 96–112)
CO2: 31 meq/L (ref 19–32)
CREATININE: 1.37 mg/dL — AB (ref 0.40–1.20)
Calcium: 8.8 mg/dL (ref 8.4–10.5)
GFR: 49.16 mL/min — ABNORMAL LOW (ref >60.00)
GLUCOSE: 130 mg/dL — AB (ref 70–99)
Potassium: 3.7 mEq/L (ref 3.5–5.1)
SODIUM: 142 meq/L (ref 135–145)

## 2014-05-20 LAB — BRAIN NATRIURETIC PEPTIDE: PRO B NATRI PEPTIDE: 65 pg/mL (ref 0.0–100.0)

## 2014-05-20 LAB — TSH: TSH: 1.85 u[IU]/mL (ref 0.35–4.50)

## 2014-05-20 MED ORDER — FLUTTER DEVI: Status: DC

## 2014-05-20 MED ORDER — PREDNISONE 10 MG PO TABS: ORAL_TABLET | ORAL | Status: DC

## 2014-05-20 MED ORDER — AMOXICILLIN-POT CLAVULANATE 875-125 MG PO TABS: 1.0000 | ORAL_TABLET | Freq: Two times a day (BID) | ORAL | Status: DC

## 2014-05-20 NOTE — Progress Notes (Signed)
Quick Note:  Spoke with pt and notified of results per Dr. Wert. Pt verbalized understanding and denied any questions.  ______ 

## 2014-05-20 NOTE — Progress Notes (Signed)
Subjective:    Patient ID: Joann Gutierrez, female    DOB: [birth year]    MRN: [id number]    Brief patient profile:  89 yobf remote smoker with  asthma, OSA, OHS, and rt hemidiaphragm elevation first noted November 2011.   TESTS: PSG 04/28/09 >> AHI 11, SpO2 low 68% Spirometry 07/05/10 >> FEV1 1.30 (64%), FEV1% 80 >> coughing during test Echo 05/21/11 >> EF 55 to 60%, mild LVH, mild/mod TR, PAS 45 mmHg ONO with CPAP and room air 10/22/12 >> Test time 10 hrs 59 min. Mean SpO2 92.4%, low SpO2 75%. Spent 16 min with SpO2 < 89%. RAST 12/11/12 >> negative, IgE 63.3 Echo 02/04/13 >> mod LVH, EF 65 to [id number], grade 1 diastolic dysfx, PAS 42 mmHg Lt, Rt Heart cath 03/03/13 >> RA 10, RV 38/7, PA 41/16 28, PCWP 13, LV 142/5, AO 137/76 PFT 03/17/13 >> FEV1 1.27 (67%), FEV1% 84, TLC 4.06 (80%), DLCO 57%, borderline BD  03/04/2014 Follow up  Patient returns for a two-week follow-up Patient was seen for a COPD flare last visit She was treated with a Z-Pak and a prednisone taper. Chest x-ray showed no acute pneumonia,  with some mild CHF. She was recommended stay on Lasix Overall she does feel better. She remains on C Pap at bedtime, says that she wears all night every night She denies any mask issues. rec No change rx   05/20/2014 acute  ov/Wert re: aeAB  Chief Complaint  Patient presents with  . Acute Visit    Pt c/o cough, wheezing, increased SOB and chest tightness for the past 5 days. Cough is prod with moderate green sputum. She is using albuterol inhaler 2 x daily on average.  normally uses nebulizer twice daily but has not worked for a month "making an odd sound" Thoroughly confused with meds/ names/ timing maint vs prn and worse at hs despite cpap.  Still comfortable at rest even s saba   No obvious day to day or daytime variabilty or assoc c  cp or chest tightness overt sinus or hb symptoms. No unusual exp hx or h/o childhood pna/ asthma or knowledge of premature birth.  Sleeping ok  without nocturnal  or early am exacerbation  of respiratory  c/o's or need for noct saba. Also denies any obvious fluctuation of symptoms with weather or environmental changes or other aggravating or alleviating factors except as outlined above   Current Medications, Allergies, Complete Past Medical History, Past Surgical History, Family History, and Social History were reviewed in [employer] record.  ROS  The following are not active complaints unless bolded sore throat, dysphagia, dental problems, itching, sneezing,  nasal congestion or excess/ purulent secretions, ear ache,   fever, chills, sweats, unintended wt loss, pleuritic or exertional cp, hemoptysis,  orthopnea pnd or leg swelling, presyncope, palpitations, heartburn, abdominal pain, anorexia, nausea, vomiting, diarrhea  or change in bowel or urinary habits, change in stools or urine, dysuria,hematuria,  rash, arthralgias, visual complaints, headache, numbness weakness or ataxia or problems with walking or coordination,  change in mood/affect or memory.            Objective:   Physical Exam  Wt Readings from Last 3 Encounters:  05/20/14 278 lb (126.1 kg)  04/06/14 267 lb (121.11 kg)  03/04/14 267 lb 12.8 oz (121.473 kg)    Vital signs reviewed    Gen.   Obese,anxious amb bf ok at rest but sob x 5 steps to exam table s  assist ENT - no lesions, no post nasal drip, class 2-3 airway Neck: No JVD, no thyromegaly, no carotid bruits Lungs: no use of accessory muscles, insp / exp wheezes and rhonchi and pseudowheezes better with plm Cardiovascular: Rhythm regular, heart sounds  normal, no murmurs or gallops, trace to 1+  peripheral edema Abdomen: soft and non-tender, no hepatosplenomegaly, BS normal. Musculoskeletal: No deformities, no cyanosis or clubbing Neuro:  alert, non focal, no tremors      I personally reviewed images and agree with radiology impression as follows:  CXR:  05/20/14   There is no  pneumonia nor pulmonary edema. There is mild stable cardiomegaly and chronic elevation of the right hemidiaphragm.  Labs ordered/ reviewed:   Lab 05/20/14 1620  NA 142  K 3.7  CL 105  CO2 31  BUN 31*  CREATININE 1.37*  GLUCOSE 130*    Recent Labs Lab 05/19/14 1040 05/20/14 1620  HGB 9.1* 10.5*  HCT  --  33.3*  WBC  --  5.7  PLT  --  265.0     Lab Results  Component Value Date   TSH 1.85 05/20/2014     Lab Results  Component Value Date   PROBNP 65.0 05/20/2014             Assessment & Plan:

## 2014-05-20 NOTE — Patient Instructions (Addendum)
Augmentin 875 mg take one pill twice daily  X 10 days - take at breakfast and supper with large glass of water.  It would help reduce the usual side effects (diarrhea and yeast infections) if you ate cultured yogurt at lunch.   Prednisone 10 mg take  4 each am x 2 days,   2 each am x 2 days,  1 each am x 2 days and stop  If needed nebulizer up to every 4 hours if needed for breathlessness   For cough use flutter valve   Please remember to go to the lab and x-ray department downstairs for your tests - we will call you with the results when they are available.  Late add : needs to regroup with Dr Halford Chessman in 2 weeks

## 2014-05-21 ENCOUNTER — Encounter: Payer: Self-pay | Admitting: Internal Medicine

## 2014-05-21 NOTE — Assessment & Plan Note (Signed)
By chart review this is a chronic problem already eval by GI and better than baseline so no immediate change in rx needed

## 2014-05-21 NOTE — Assessment & Plan Note (Addendum)
Spirometry 07/05/10 >> FEV1 1.30 (64%), FEV1% 80  05/20/2014 p extensive coaching HFA effectiveness =    50%    Poorly controlled chronically now acutely worse x 5 days but still comfortable and oxygenating well at rest  DDX of  difficult airways management all start with A and  include Adherence, Ace Inhibitors, Acid Reflux, Active Sinus Disease, Alpha 1 Antitripsin deficiency, Anxiety masquerading as Airways dz,  ABPA,  allergy(esp in young), Aspiration (esp in elderly), Adverse effects of DPI,  Active smokers, plus two Bs  = Bronchiectasis and Beta blocker use..and one C= CHF  Adherence is always the initial "prime suspect" and is a multilayered concern that requires a "trust but verify" approach in every patient - starting with knowing how to use medications, especially inhalers, correctly, keeping up with refills and understanding the fundamental difference between maintenance and prns vs those medications only taken for a very short course and then stopped and not refilled.  - The proper method of use, as well as anticipated side effects, of a metered-dose inhaler are discussed and demonstrated to the patient. Improved effectiveness after extensive coaching during this visit to a level of approximately  50% so needs more work - doesn't understand maint vs prns - didn't call with neb out   ? Acid (or non-acid) GERD > always difficult to exclude as up to 75% of pts in some series report no assoc GI/ Heartburn symptoms> rec continue max (24h)  acid suppression and diet restrictions/ reviewed     ? Allergy >Prednisone 10 mg take  4 each am x 2 days,   2 each am x 2 days,  1 each am x 2 days and stop  ? Active sinus dz > Augmentin 875 mg take one pill twice daily  X 10 days    I had an extended discussion with the patient reviewing all relevant studies completed to date and  lasting 15 to 20 minutes of a 25 minute visit on the following ongoing concerns:   Each maintenance medication was  reviewed in detail including most importantly the difference between maintenance and as needed and under what circumstances the prns are to be used.  Please see instructions for details which were reviewed in writing and the patient given a copy.    Given a single dose of Nebulized performist to cover her until neb is delivered later this evening > arranged

## 2014-05-21 NOTE — Assessment & Plan Note (Signed)
Also clearly related to obesity with some pseudowheeze on exam > rx flutter valve/ rec wt loss  No evidence chf by bnp < 100

## 2014-05-27 ENCOUNTER — Encounter (HOSPITAL_COMMUNITY)
Admission: RE | Admit: 2014-05-27 | Discharge: 2014-05-27 | Disposition: A | Discharge status: Home / Self Care | Payer: Medicare Other | Source: Ambulatory Visit | Attending: Nephrology | Admitting: Nephrology

## 2014-05-27 DIAGNOSIS — D631 Anemia in chronic kidney disease: Secondary | ICD-10-CM | POA: Insufficient documentation

## 2014-05-27 DIAGNOSIS — N183 Chronic kidney disease, stage 3 (moderate): Secondary | ICD-10-CM | POA: Insufficient documentation

## 2014-05-27 LAB — IRON AND TIBC
Iron: 55 ug/dL (ref 28–170)
Saturation Ratios: 22 % (ref 10.4–31.8)
TIBC: 246 ug/dL — AB (ref 250–450)
UIBC: 191 ug/dL

## 2014-05-27 LAB — FERRITIN: FERRITIN: 140 ng/mL (ref 11–307)

## 2014-05-27 LAB — POCT HEMOGLOBIN-HEMACUE: Hemoglobin: 10.4 g/dL — ABNORMAL LOW (ref 12.0–15.0)

## 2014-05-27 MED ORDER — EPOETIN ALFA 10000 UNIT/ML IJ SOLN
INTRAMUSCULAR | Status: AC
  Filled 2014-05-27: qty 1

## 2014-05-27 MED ORDER — EPOETIN ALFA 10000 UNIT/ML IJ SOLN
10000.0000 [IU] | INTRAMUSCULAR | Status: DC
  Administered 2014-05-27: 10000 [IU] via SUBCUTANEOUS

## 2014-06-03 ENCOUNTER — Encounter: Payer: Self-pay | Admitting: Adult Health

## 2014-06-03 ENCOUNTER — Ambulatory Visit (INDEPENDENT_AMBULATORY_CARE_PROVIDER_SITE_OTHER): Payer: Medicare Other | Admitting: Adult Health

## 2014-06-03 VITALS — BP 140/62 | HR 79 | Temp 98.3°F | Ht 64.0 in | Wt 278.4 lb

## 2014-06-03 DIAGNOSIS — G4733 Obstructive sleep apnea (adult) (pediatric): Secondary | ICD-10-CM

## 2014-06-03 DIAGNOSIS — J449 Chronic obstructive pulmonary disease, unspecified: Secondary | ICD-10-CM | POA: Diagnosis not present

## 2014-06-03 NOTE — Assessment & Plan Note (Signed)
Continue C Pap at bedtime.  Order sent for new mask and supplies .  Follow-up with Dr. Halford Chessman  in 2 months and as needed

## 2014-06-03 NOTE — Patient Instructions (Signed)
Continue on current regimen Mucinex DM Twice daily  As needed  Cough/congestion.  Continue C Pap at bedtime.  Order sent for new mask and supplies .  Follow-up with Dr. Halford Chessman  in 2 months and as needed

## 2014-06-03 NOTE — Progress Notes (Signed)
   Subjective:    Patient ID: Joann Gutierrez, female    DOB: [birth year], 69 y.o.   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   TESTS: PSG 04/28/09 >> AHI 11, SpO2 low 68% Spirometry 07/05/10 >> FEV1 1.30 (64%), FEV1% 80 >> coughing during test Echo 05/21/11 >> EF 55 to 60%, mild LVH, mild/mod TR, PAS 45 mmHg ONO with CPAP and room air 10/22/12 >> Test time 10 hrs 59 min. Mean SpO2 92.4%, low SpO2 75%. Spent 16 min with SpO2 < 89%. RAST 12/11/12 >> negative, IgE 63.3 Echo 02/04/13 >> mod LVH, EF 65 to [id number], grade 1 diastolic dysfx, PAS 42 mmHg Lt, Rt Heart cath 03/03/13 >> RA 10, RV 38/7, PA 41/16 28, PCWP 13, LV 142/5, AO 137/76 PFT 03/17/13 >> FEV1 1.27 (67%), FEV1% 84, TLC 4.06 (80%), DLCO 57%, borderline BD  06/03/2014 Follow up  Patient returns for a two-week follow-up Patient was seen for a COPD flare last visit She was treated with a augmentin x 10d and a prednisone taper. Chest x-ray showed no acute pneumonia.  labs w/ neg bnp  Overall she does feel better , still has some lingering mucus .  No fever , chest pain, orthopnea, edema, or n/v/d.     Review of Systems neg for any significant sore throat, dysphagia, itching, sneezing, nasal congestion or excess/ purulent secretions, fever, chills, sweats, unintended wt loss, pleuritic or exertional cp, hempoptysis, orthopnea pnd or change in chronic leg swelling. Also denies presyncope, palpitations, heartburn, abdominal pain, nausea, vomiting, diarrhea or change in bowel or urinary habits, dysuria,hematuria, rash, arthralgias, visual complaints, headache, numbness weakness or ataxia.     Objective:   Physical Exam  Gen. Pleasant, obese, in no distress, normal affect ENT - no lesions, no post nasal drip, class 2-3 airway Neck: No JVD, no thyromegaly, no carotid bruits Lungs: no use of accessory muscles, no dullness to percussion, decreased  breath sounds in the bases without, wheezes Cardiovascular: Rhythm regular, heart sounds  normal, no murmurs or gallops, trace   peripheral edema Abdomen: soft and non-tender, no hepatosplenomegaly, BS normal. Musculoskeletal: No deformities, no cyanosis or clubbing Neuro:  alert, non focal, no tremors       Assessment & Plan:

## 2014-06-03 NOTE — Progress Notes (Signed)
Reviewed and agree with assessment/plan. 

## 2014-06-03 NOTE — Assessment & Plan Note (Signed)
Recent flare -resolving   Plan  Continue on current regimen Mucinex DM Twice daily  As needed  Cough/congestion.    Follow-up with Dr. Halford Chessman  in 2 months and as needed

## 2014-06-06 ENCOUNTER — Ambulatory Visit (HOSPITAL_COMMUNITY)
Admission: RE | Admit: 2014-06-06 | Discharge: 2014-06-06 | Disposition: A | Discharge status: Home / Self Care | Payer: Medicare Other | Source: Ambulatory Visit | Attending: Nephrology | Admitting: Nephrology

## 2014-06-06 DIAGNOSIS — D631 Anemia in chronic kidney disease: Secondary | ICD-10-CM | POA: Diagnosis not present

## 2014-06-06 DIAGNOSIS — Z5181 Encounter for therapeutic drug level monitoring: Secondary | ICD-10-CM | POA: Diagnosis not present

## 2014-06-06 DIAGNOSIS — N183 Chronic kidney disease, stage 3 (moderate): Secondary | ICD-10-CM | POA: Diagnosis not present

## 2014-06-06 DIAGNOSIS — Z79899 Other long term (current) drug therapy: Secondary | ICD-10-CM | POA: Insufficient documentation

## 2014-06-06 LAB — POCT HEMOGLOBIN-HEMACUE: Hemoglobin: 10.9 g/dL — ABNORMAL LOW (ref 12.0–15.0)

## 2014-06-06 MED ORDER — EPOETIN ALFA 10000 UNIT/ML IJ SOLN
INTRAMUSCULAR | Status: AC
  Administered 2014-06-06: 10000 [IU] via SUBCUTANEOUS
  Filled 2014-06-06: qty 1

## 2014-06-06 MED ORDER — EPOETIN ALFA 10000 UNIT/ML IJ SOLN
10000.0000 [IU] | INTRAMUSCULAR | Status: DC
  Administered 2014-06-06: 10000 [IU] via SUBCUTANEOUS

## 2014-06-06 NOTE — Addendum Note (Signed)
Addended by: Parke Poisson E on: 06/06/2014 10:06 AM   Modules accepted: Orders

## 2014-06-13 ENCOUNTER — Encounter: Payer: Self-pay | Admitting: Gastroenterology

## 2014-06-16 ENCOUNTER — Encounter (HOSPITAL_COMMUNITY)
Admission: RE | Admit: 2014-06-16 | Discharge: 2014-06-16 | Disposition: A | Discharge status: Home / Self Care | Payer: Medicare Other | Source: Ambulatory Visit | Attending: Nephrology | Admitting: Nephrology

## 2014-06-16 DIAGNOSIS — N183 Chronic kidney disease, stage 3 (moderate): Secondary | ICD-10-CM | POA: Diagnosis not present

## 2014-06-16 DIAGNOSIS — Z5181 Encounter for therapeutic drug level monitoring: Secondary | ICD-10-CM | POA: Diagnosis not present

## 2014-06-16 DIAGNOSIS — Z79899 Other long term (current) drug therapy: Secondary | ICD-10-CM | POA: Diagnosis not present

## 2014-06-16 DIAGNOSIS — D631 Anemia in chronic kidney disease: Secondary | ICD-10-CM | POA: Diagnosis not present

## 2014-06-16 LAB — POCT HEMOGLOBIN-HEMACUE: HEMOGLOBIN: 10.1 g/dL — AB (ref 12.0–15.0)

## 2014-06-16 MED ORDER — EPOETIN ALFA 10000 UNIT/ML IJ SOLN
10000.0000 [IU] | INTRAMUSCULAR | Status: DC
  Administered 2014-06-16: 10000 [IU] via SUBCUTANEOUS

## 2014-06-16 MED ORDER — EPOETIN ALFA 10000 UNIT/ML IJ SOLN
INTRAMUSCULAR | Status: AC
  Filled 2014-06-16: qty 1

## 2014-06-23 ENCOUNTER — Encounter (HOSPITAL_COMMUNITY)
Admission: RE | Admit: 2014-06-23 | Discharge: 2014-06-23 | Disposition: A | Discharge status: Home / Self Care | Payer: Medicare Other | Source: Ambulatory Visit | Attending: Nephrology | Admitting: Nephrology

## 2014-06-23 DIAGNOSIS — D631 Anemia in chronic kidney disease: Secondary | ICD-10-CM | POA: Diagnosis not present

## 2014-06-23 DIAGNOSIS — N183 Chronic kidney disease, stage 3 (moderate): Secondary | ICD-10-CM | POA: Diagnosis not present

## 2014-06-23 LAB — IRON AND TIBC
Iron: 54 ug/dL (ref 28–170)
SATURATION RATIOS: 23 % (ref 10.4–31.8)
TIBC: 231 ug/dL — AB (ref 250–450)
UIBC: 177 ug/dL

## 2014-06-23 LAB — POCT HEMOGLOBIN-HEMACUE: Hemoglobin: 10.4 g/dL — ABNORMAL LOW (ref 12.0–15.0)

## 2014-06-23 LAB — FERRITIN: FERRITIN: 129 ng/mL (ref 11–307)

## 2014-06-23 MED ORDER — EPOETIN ALFA 10000 UNIT/ML IJ SOLN
10000.0000 [IU] | INTRAMUSCULAR | Status: DC
  Administered 2014-06-23: 10000 [IU] via SUBCUTANEOUS

## 2014-06-23 MED ORDER — EPOETIN ALFA 10000 UNIT/ML IJ SOLN
INTRAMUSCULAR | Status: AC
  Filled 2014-06-23: qty 1

## 2014-06-27 IMAGING — CR DG CHEST 2V
2 series · 2 of 2 positions shown · non-contrast
Comparison: DG CHEST 2 VIEW dated 12/09/2012

CLINICAL DATA: Chest pain.  Shortness of breath.

EXAM:
CHEST  2 VIEW

[view not recorded (1 of 2)]
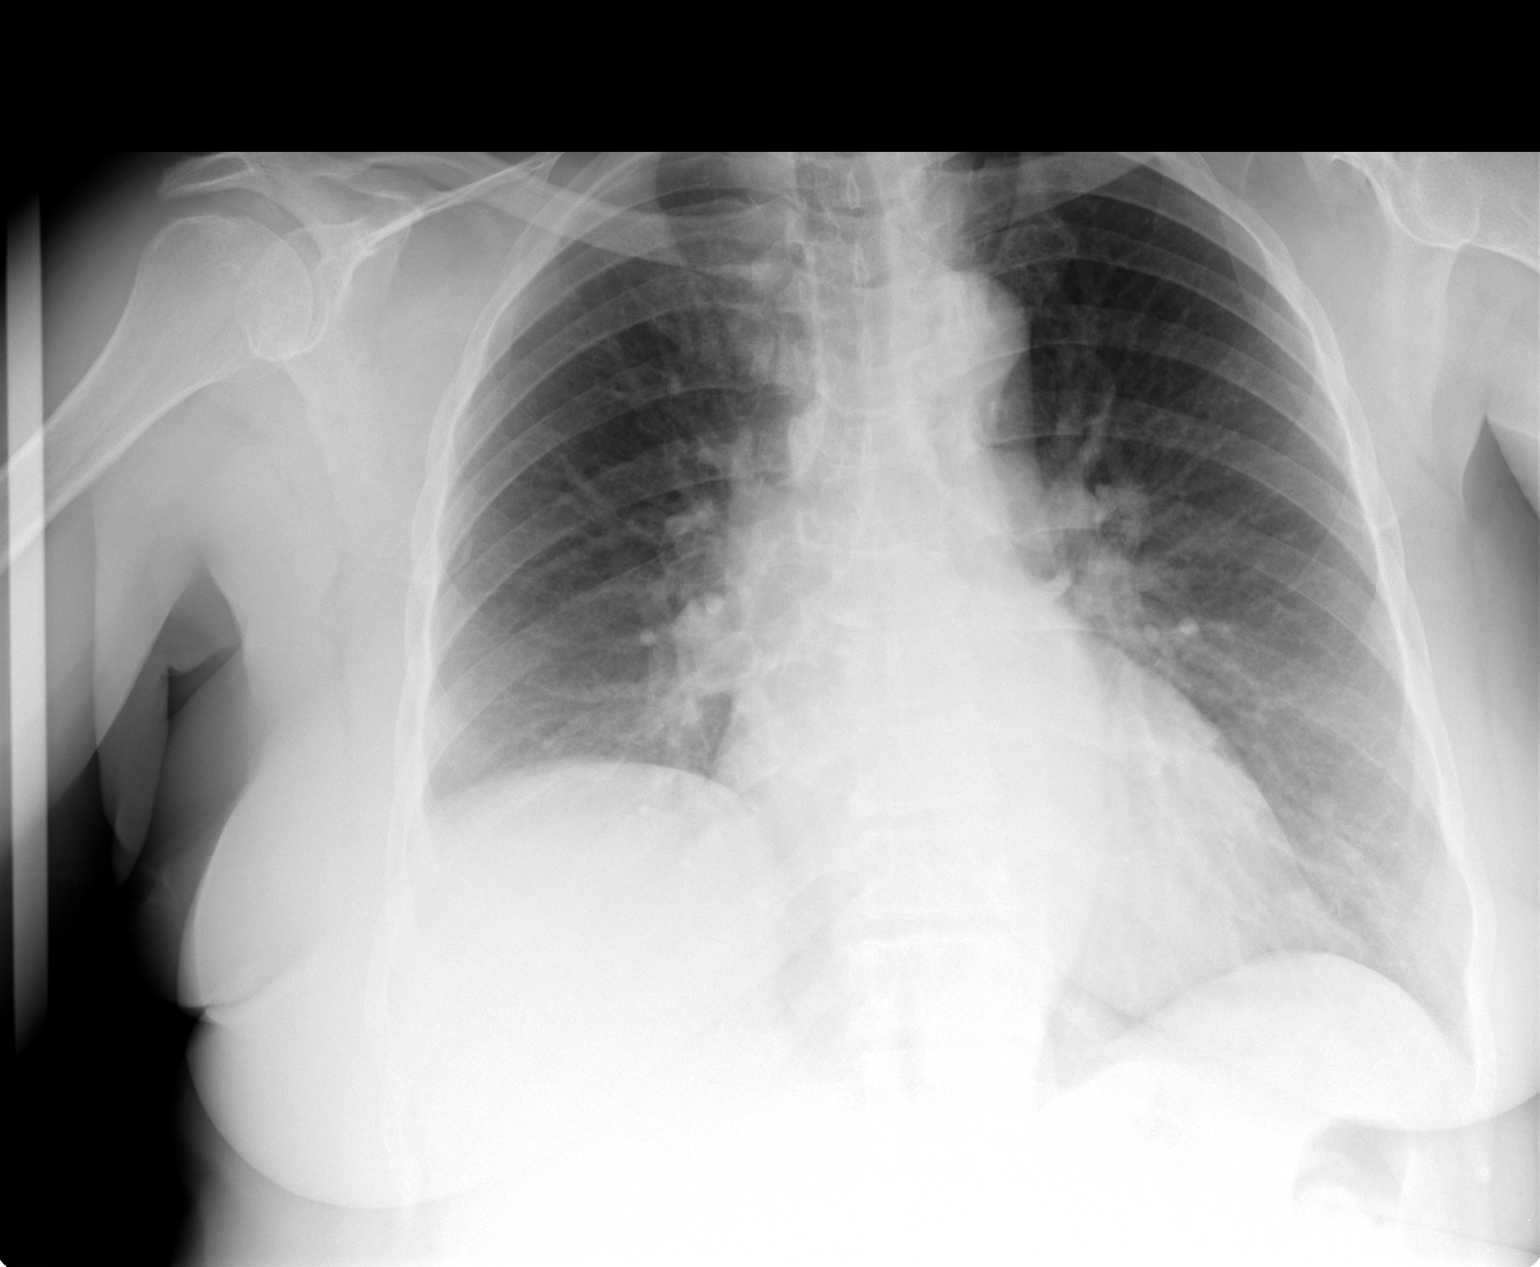

[view not recorded (2 of 2)]
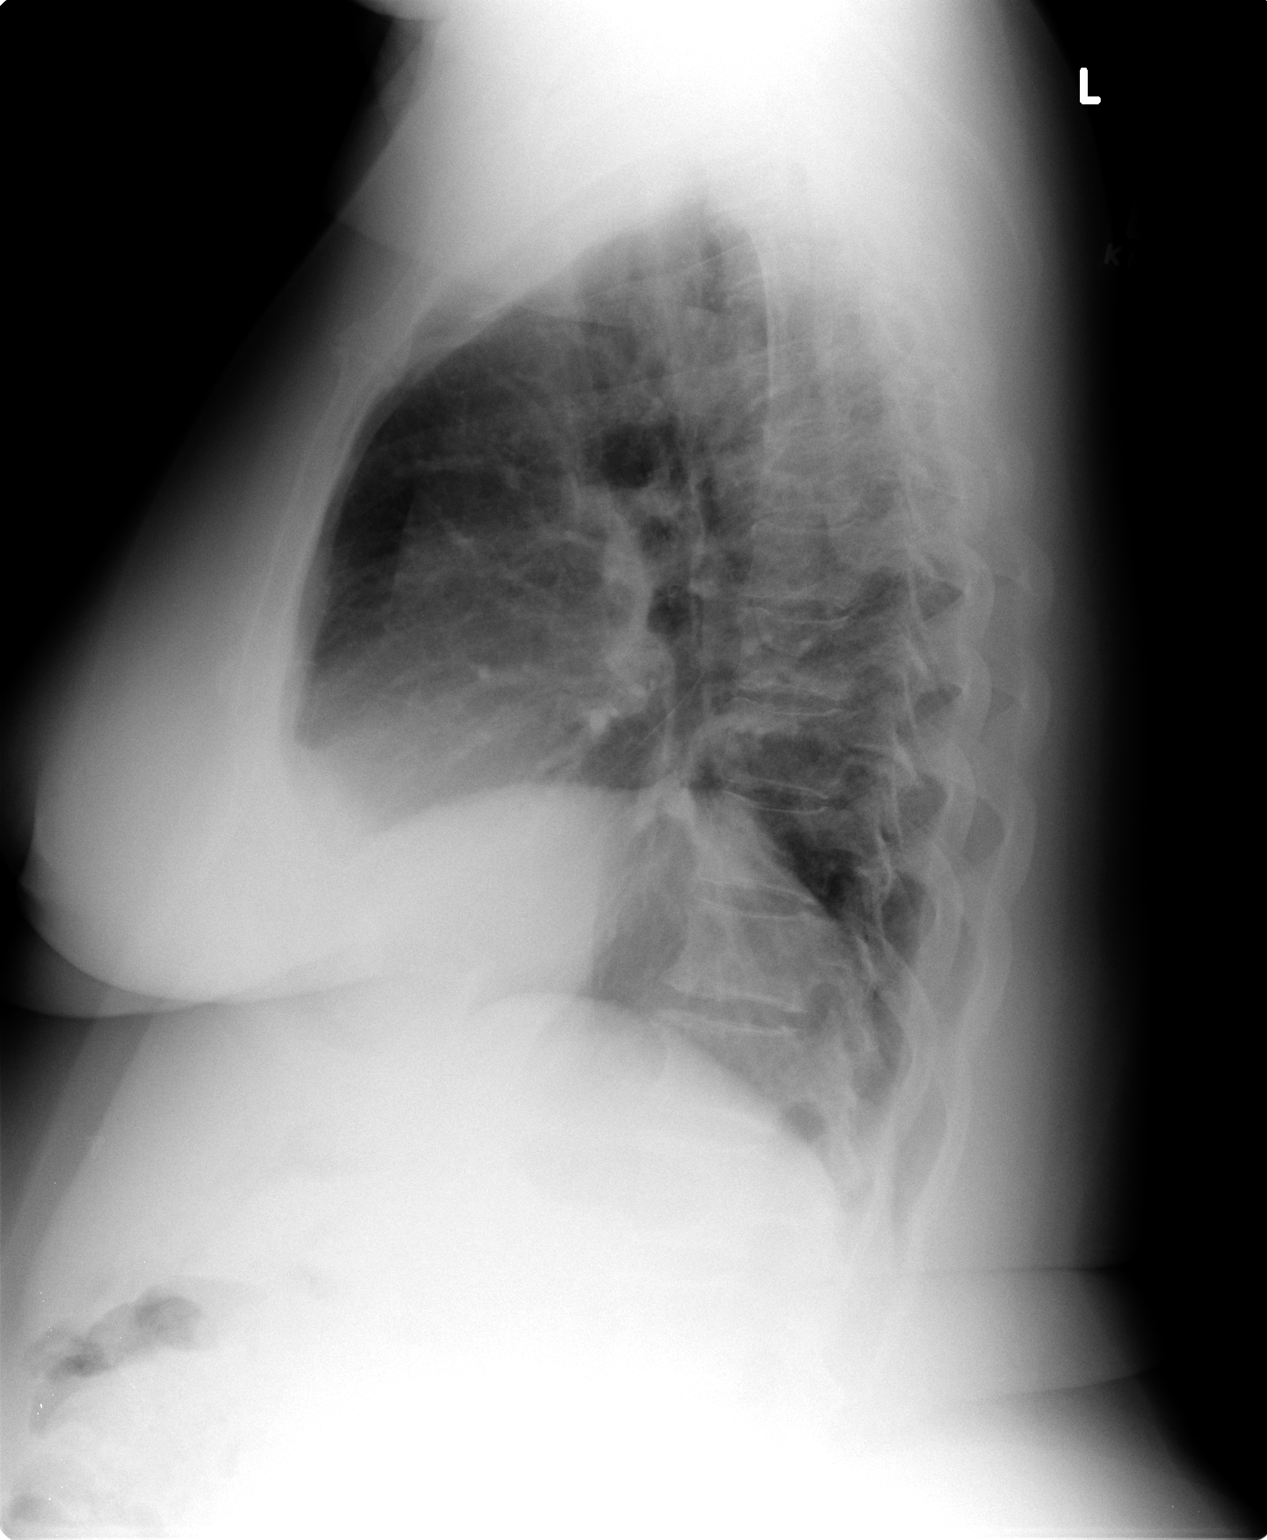

[2 of 2 positions shown; findings below may reference images not displayed]

FINDINGS: Mediastinum and hilar structures are normal. Borderline cardiomegaly
with normal pulmonary vascularity. No pleural effusion or
pneumothorax. No acute bony abnormality.
IMPRESSION: Borderline cardiomegaly.  No CHF.  No acute abnormality.

## 2014-06-29 ENCOUNTER — Other Ambulatory Visit (HOSPITAL_COMMUNITY): Payer: Self-pay | Admitting: *Deleted

## 2014-06-30 ENCOUNTER — Encounter (HOSPITAL_COMMUNITY)
Admission: RE | Admit: 2014-06-30 | Discharge: 2014-06-30 | Disposition: A | Discharge status: Home / Self Care | Payer: Medicare Other | Source: Ambulatory Visit | Attending: Nephrology | Admitting: Nephrology

## 2014-06-30 DIAGNOSIS — N183 Chronic kidney disease, stage 3 (moderate): Secondary | ICD-10-CM | POA: Diagnosis not present

## 2014-06-30 DIAGNOSIS — D631 Anemia in chronic kidney disease: Secondary | ICD-10-CM | POA: Diagnosis not present

## 2014-06-30 LAB — POCT HEMOGLOBIN-HEMACUE: HEMOGLOBIN: 11 g/dL — AB (ref 12.0–15.0)

## 2014-06-30 MED ORDER — EPOETIN ALFA 10000 UNIT/ML IJ SOLN
INTRAMUSCULAR | Status: AC
  Administered 2014-06-30: 10000 [IU] via SUBCUTANEOUS
  Filled 2014-06-30: qty 1

## 2014-06-30 MED ORDER — EPOETIN ALFA 10000 UNIT/ML IJ SOLN
10000.0000 [IU] | INTRAMUSCULAR | Status: DC
  Administered 2014-06-30: 10000 [IU] via SUBCUTANEOUS

## 2014-07-07 ENCOUNTER — Encounter (HOSPITAL_COMMUNITY)
Admission: RE | Admit: 2014-07-07 | Discharge: 2014-07-07 | Disposition: A | Discharge status: Home / Self Care | Payer: Medicare Other | Source: Ambulatory Visit | Attending: Nephrology | Admitting: Nephrology

## 2014-07-07 DIAGNOSIS — N183 Chronic kidney disease, stage 3 (moderate): Secondary | ICD-10-CM | POA: Diagnosis not present

## 2014-07-07 DIAGNOSIS — D631 Anemia in chronic kidney disease: Secondary | ICD-10-CM | POA: Diagnosis not present

## 2014-07-07 LAB — POCT HEMOGLOBIN-HEMACUE: Hemoglobin: 9.9 g/dL — ABNORMAL LOW (ref 12.0–15.0)

## 2014-07-07 MED ORDER — EPOETIN ALFA 10000 UNIT/ML IJ SOLN
10000.0000 [IU] | INTRAMUSCULAR | Status: DC
  Administered 2014-07-07: 10000 [IU] via SUBCUTANEOUS

## 2014-07-07 MED ORDER — EPOETIN ALFA 10000 UNIT/ML IJ SOLN
INTRAMUSCULAR | Status: AC
Start: 2014-07-07 — End: 2014-07-07
  Filled 2014-07-07: qty 1

## 2014-07-08 ENCOUNTER — Encounter: Payer: Self-pay | Admitting: Podiatry

## 2014-07-08 ENCOUNTER — Ambulatory Visit (INDEPENDENT_AMBULATORY_CARE_PROVIDER_SITE_OTHER): Payer: Medicare Other | Admitting: Podiatry

## 2014-07-08 VITALS — BP 170/73 | HR 83

## 2014-07-08 DIAGNOSIS — M79606 Pain in leg, unspecified: Secondary | ICD-10-CM | POA: Diagnosis not present

## 2014-07-08 DIAGNOSIS — B351 Tinea unguium: Secondary | ICD-10-CM

## 2014-07-08 NOTE — Progress Notes (Signed)
Subjective: 69 y.o. year old diabetic female patient presents complaining of painful nails. Patient requests toe nails trimmed.  Blood sugar this morning was 117.  Objective: Dermatologic:  Hypertrophic nails x 10.  No plantar lesions, no abnormal skin lesions noted.  Vascular:  All pedal pulses are palpable bilateral.  No pedal edema noted.  Orthopedic:  Mild hallux valgus with bunion on left foot.  Neurologic:  All epicritic and tactile sensations grossly intact.   Assessment:  Dystrophic mycotic nails x 10.  IDDM, under control.  Painful feet.   Treatment: All mycotic nails debrided.  Return in 3 months or as needed

## 2014-07-08 NOTE — Patient Instructions (Signed)
Seen for hypertrophic nails. All nails debrided. Return in 3 months or as needed.  

## 2014-07-14 ENCOUNTER — Encounter (HOSPITAL_COMMUNITY)
Admission: RE | Admit: 2014-07-14 | Discharge: 2014-07-14 | Disposition: A | Discharge status: Home / Self Care | Payer: Medicare Other | Source: Ambulatory Visit | Attending: Nephrology | Admitting: Nephrology

## 2014-07-14 DIAGNOSIS — N183 Chronic kidney disease, stage 3 (moderate): Secondary | ICD-10-CM | POA: Diagnosis not present

## 2014-07-14 DIAGNOSIS — D631 Anemia in chronic kidney disease: Secondary | ICD-10-CM | POA: Diagnosis not present

## 2014-07-14 LAB — POCT HEMOGLOBIN-HEMACUE: Hemoglobin: 10 g/dL — ABNORMAL LOW (ref 12.0–15.0)

## 2014-07-14 MED ORDER — EPOETIN ALFA 10000 UNIT/ML IJ SOLN
INTRAMUSCULAR | Status: AC
Start: 2014-07-14 — End: 2014-07-14
  Filled 2014-07-14: qty 1

## 2014-07-14 MED ORDER — EPOETIN ALFA 10000 UNIT/ML IJ SOLN
10000.0000 [IU] | INTRAMUSCULAR | Status: DC
  Administered 2014-07-14: 10000 [IU] via SUBCUTANEOUS

## 2014-07-18 ENCOUNTER — Other Ambulatory Visit: Payer: Self-pay

## 2014-07-21 ENCOUNTER — Encounter (HOSPITAL_COMMUNITY)
Admission: RE | Admit: 2014-07-21 | Discharge: 2014-07-21 | Disposition: A | Discharge status: Home / Self Care | Payer: Medicare Other | Source: Ambulatory Visit | Attending: Nephrology | Admitting: Nephrology

## 2014-07-21 DIAGNOSIS — D631 Anemia in chronic kidney disease: Secondary | ICD-10-CM | POA: Diagnosis not present

## 2014-07-21 DIAGNOSIS — N183 Chronic kidney disease, stage 3 (moderate): Secondary | ICD-10-CM | POA: Diagnosis not present

## 2014-07-21 LAB — POCT HEMOGLOBIN-HEMACUE: Hemoglobin: 9.6 g/dL — ABNORMAL LOW (ref 12.0–15.0)

## 2014-07-21 MED ORDER — EPOETIN ALFA 10000 UNIT/ML IJ SOLN
INTRAMUSCULAR | Status: AC
Start: 2014-07-21 — End: 2014-07-21
  Filled 2014-07-21: qty 1

## 2014-07-21 MED ORDER — EPOETIN ALFA 10000 UNIT/ML IJ SOLN
10000.0000 [IU] | INTRAMUSCULAR | Status: DC
  Administered 2014-07-21: 10000 [IU] via SUBCUTANEOUS

## 2014-07-29 ENCOUNTER — Encounter (HOSPITAL_COMMUNITY)
Admission: RE | Admit: 2014-07-29 | Discharge: 2014-07-29 | Disposition: A | Discharge status: Home / Self Care | Payer: Medicare Other | Source: Ambulatory Visit | Attending: Nephrology | Admitting: Nephrology

## 2014-07-29 DIAGNOSIS — D631 Anemia in chronic kidney disease: Secondary | ICD-10-CM | POA: Insufficient documentation

## 2014-07-29 DIAGNOSIS — I2510 Atherosclerotic heart disease of native coronary artery without angina pectoris: Secondary | ICD-10-CM | POA: Diagnosis not present

## 2014-07-29 DIAGNOSIS — N183 Chronic kidney disease, stage 3 (moderate): Secondary | ICD-10-CM | POA: Insufficient documentation

## 2014-07-29 DIAGNOSIS — Z794 Long term (current) use of insulin: Secondary | ICD-10-CM | POA: Diagnosis not present

## 2014-07-29 DIAGNOSIS — E1121 Type 2 diabetes mellitus with diabetic nephropathy: Secondary | ICD-10-CM | POA: Diagnosis not present

## 2014-07-29 DIAGNOSIS — I10 Essential (primary) hypertension: Secondary | ICD-10-CM | POA: Diagnosis not present

## 2014-07-29 DIAGNOSIS — E780 Pure hypercholesterolemia: Secondary | ICD-10-CM | POA: Diagnosis not present

## 2014-07-29 DIAGNOSIS — R413 Other amnesia: Secondary | ICD-10-CM | POA: Diagnosis not present

## 2014-07-29 DIAGNOSIS — J45909 Unspecified asthma, uncomplicated: Secondary | ICD-10-CM | POA: Diagnosis not present

## 2014-07-29 LAB — FERRITIN: FERRITIN: 118 ng/mL (ref 11–307)

## 2014-07-29 LAB — IRON AND TIBC
Iron: 53 ug/dL (ref 28–170)
SATURATION RATIOS: 25 % (ref 10.4–31.8)
TIBC: 216 ug/dL — AB (ref 250–450)
UIBC: 163 ug/dL

## 2014-07-29 LAB — POCT HEMOGLOBIN-HEMACUE: Hemoglobin: 11 g/dL — ABNORMAL LOW (ref 12.0–15.0)

## 2014-07-29 MED ORDER — EPOETIN ALFA 10000 UNIT/ML IJ SOLN
10000.0000 [IU] | INTRAMUSCULAR | Status: DC
  Administered 2014-07-29: 10000 [IU] via SUBCUTANEOUS

## 2014-07-29 MED ORDER — EPOETIN ALFA 10000 UNIT/ML IJ SOLN
INTRAMUSCULAR | Status: AC
  Filled 2014-07-29: qty 1

## 2014-08-05 ENCOUNTER — Encounter (HOSPITAL_COMMUNITY)
Admission: RE | Admit: 2014-08-05 | Discharge: 2014-08-05 | Disposition: A | Discharge status: Home / Self Care | Payer: Medicare Other | Source: Ambulatory Visit | Attending: Nephrology | Admitting: Nephrology

## 2014-08-05 DIAGNOSIS — D631 Anemia in chronic kidney disease: Secondary | ICD-10-CM | POA: Diagnosis not present

## 2014-08-05 DIAGNOSIS — N183 Chronic kidney disease, stage 3 (moderate): Secondary | ICD-10-CM | POA: Diagnosis not present

## 2014-08-05 LAB — POCT HEMOGLOBIN-HEMACUE: HEMOGLOBIN: 10.2 g/dL — AB (ref 12.0–15.0)

## 2014-08-05 MED ORDER — EPOETIN ALFA 10000 UNIT/ML IJ SOLN
INTRAMUSCULAR | Status: AC
  Filled 2014-08-05: qty 1

## 2014-08-05 MED ORDER — EPOETIN ALFA 10000 UNIT/ML IJ SOLN
10000.0000 [IU] | INTRAMUSCULAR | Status: DC
  Administered 2014-08-05: 10000 [IU] via SUBCUTANEOUS

## 2014-08-12 ENCOUNTER — Encounter (HOSPITAL_COMMUNITY)
Admission: RE | Admit: 2014-08-12 | Discharge: 2014-08-12 | Disposition: A | Discharge status: Home / Self Care | Payer: Medicare Other | Source: Ambulatory Visit | Attending: Nephrology | Admitting: Nephrology

## 2014-08-12 DIAGNOSIS — N183 Chronic kidney disease, stage 3 (moderate): Secondary | ICD-10-CM | POA: Diagnosis not present

## 2014-08-12 DIAGNOSIS — D631 Anemia in chronic kidney disease: Secondary | ICD-10-CM | POA: Diagnosis not present

## 2014-08-12 LAB — POCT HEMOGLOBIN-HEMACUE: Hemoglobin: 9.9 g/dL — ABNORMAL LOW (ref 12.0–15.0)

## 2014-08-12 MED ORDER — EPOETIN ALFA 10000 UNIT/ML IJ SOLN
INTRAMUSCULAR | Status: AC
  Filled 2014-08-12: qty 1

## 2014-08-12 MED ORDER — EPOETIN ALFA 10000 UNIT/ML IJ SOLN
10000.0000 [IU] | INTRAMUSCULAR | Status: DC
  Administered 2014-08-12: 10000 [IU] via SUBCUTANEOUS

## 2014-08-15 ENCOUNTER — Ambulatory Visit (INDEPENDENT_AMBULATORY_CARE_PROVIDER_SITE_OTHER): Payer: Medicare Other | Admitting: Pulmonary Disease

## 2014-08-15 ENCOUNTER — Encounter: Payer: Self-pay | Admitting: Pulmonary Disease

## 2014-08-15 VITALS — BP 164/88 | HR 70 | Ht 60.0 in | Wt 277.8 lb

## 2014-08-15 DIAGNOSIS — J309 Allergic rhinitis, unspecified: Secondary | ICD-10-CM | POA: Diagnosis not present

## 2014-08-15 DIAGNOSIS — J449 Chronic obstructive pulmonary disease, unspecified: Secondary | ICD-10-CM

## 2014-08-15 DIAGNOSIS — R05 Cough: Secondary | ICD-10-CM

## 2014-08-15 DIAGNOSIS — J4489 Other specified chronic obstructive pulmonary disease: Secondary | ICD-10-CM

## 2014-08-15 DIAGNOSIS — R058 Other specified cough: Secondary | ICD-10-CM

## 2014-08-15 DIAGNOSIS — G4733 Obstructive sleep apnea (adult) (pediatric): Secondary | ICD-10-CM

## 2014-08-15 NOTE — Patient Instructions (Signed)
Try using flonase two sprays in each nostril daily for at least two weeks, and then as needed if sinus congestion persists >> call if you need a refill Sip water when you have urge to cough Avoid forcing a cough Follow up in 6 months

## 2014-08-15 NOTE — Progress Notes (Signed)
Chief Complaint  Patient presents with  . Follow-up    Wears CPAP nightly x 10-12hrs. Denies problems with mask and pressure. Pt c/o increased amounts of mucus trapped in throat x 1 month, SOB, cough and wheezing. Not treated with OTC meds.    History of Present Illness: Joann Gutierrez is a 69 y.o. 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  She has noticed sinus congestion and post-nasal drip.  She feels like something is stuck in her throat.  She tries to cough up phlegm, but nothing usually comes up.  Her throat has been raspy.  She does not feel like she gets wheezing in her chest.  She has been using CPAP w/o difficulty.  She has a new mask and this has been working well.  TESTS: PSG 04/28/09 >> AHI 11, SpO2 low 68% Spirometry 07/05/10 >> FEV1 1.30 (64%), FEV1% 80 >> coughing during test Echo 05/21/11 >> EF 55 to 60%, mild LVH, mild/mod TR, PAS 45 mmHg ONO with CPAP and room air 10/22/12 >> Test time 10 hrs 59 min. Mean SpO2 92.4%, low SpO2 75%. Spent 16 min with SpO2 < 89%. RAST 12/11/12 >> negative, IgE 63.3 Echo 02/04/13 >> mod LVH, EF 65 to [id number], grade 1 diastolic dysfx, PAS 42 mmHg Lt, Rt Heart cath 03/03/13 >> RA 10, RV 38/7, PA 41/16 28, PCWP 13, LV 142/5, AO 137/76 PFT 03/17/13 >> FEV1 1.27 (67%), FEV1% 84, TLC 4.06 (80%), DLCO 57%, borderline BD CPAP 07/17/14 to 08/14/14 >> used on 27 of 30 nights with average 7 hrs and 48 min.  Average AHI is 4.6 with CPAP 9 cm H2O.   PMHx >> Depression, DM, HTN, Diastolic dysfunction, HLD, CAD, CVA, GERD, Esophageal stricture, Gastroparesis, Hiatal hernia, Sickle cell trait, CKD  PSHx, Medications, Allergies, Fhx, Shx reviewed.  Physical Exam: BP 164/88 mmHg  Pulse 70  Ht 5' (1.524 m)  Wt 277 lb 12.8 oz (126.009 kg)  BMI 54.25 kg/m2  SpO2 95%  General - No distress ENT - No sinus tenderness, no oral exudate, no LAN Cardiac - s1s2 regular, no murmur Chest - no wheeze/rales Back - No  focal tenderness Abd - Soft, non-tender Ext - No edema Neuro - Normal strength Skin - No rashes Psych - normal mood, and behavior   CMP Latest Ref Rng 05/20/2014 09/18/2013 09/17/2013  Glucose 70 - 99 mg/dL 130(H) 108(H) 145(H)  BUN 6 - 23 mg/dL 31(H) 35(H) 35(H)  Creatinine 0.40 - 1.20 mg/dL 1.37(H) 2.29(H) 2.32(H)  Sodium 135 - 145 mEq/L 142 139 139  Potassium 3.5 - 5.1 mEq/L 3.7 4.1 3.9  Chloride 96 - 112 mEq/L 105 102 102  CO2 19 - 32 mEq/L 31 25 25   Calcium 8.4 - 10.5 mg/dL 8.8 8.7 8.6  Total Protein 6.0 - 8.3 g/dL - - 6.4  Total Bilirubin 0.3 - 1.2 mg/dL - - 0.2(L)  Alkaline Phos 39 - 117 U/L - - 126(H)  AST 0 - 37 U/L - - 22  ALT 0 - 35 U/L - - 46(H)     Assessment/Plan:  Upper airway cough syndrome. Much of her current symptoms likely related to this. Plan: - restart flonase - avoid forcing cough - sip water when urge to cough  COPD with asthma. Plan: - continue symbicort and singulair - prn albuterol  Chronic respiratory failure. From COPD/asthma, and Rt hemidiaphragm elevation. Plan: - continue 2 liters oxygen at night  Obstructive sleep apnea. She is compliant with  CPAP and reports benefit. Plan: - continue CPAP 9 cm H2O  Obesity. Plan: - discussed importance of weight loss   [name], MD [facility] Pager:  [phone]

## 2014-08-19 ENCOUNTER — Encounter (HOSPITAL_COMMUNITY)
Admission: RE | Admit: 2014-08-19 | Discharge: 2014-08-19 | Disposition: A | Discharge status: Home / Self Care | Payer: Medicare Other | Source: Ambulatory Visit | Attending: Nephrology | Admitting: Nephrology

## 2014-08-19 DIAGNOSIS — D631 Anemia in chronic kidney disease: Secondary | ICD-10-CM | POA: Diagnosis not present

## 2014-08-19 DIAGNOSIS — N183 Chronic kidney disease, stage 3 (moderate): Secondary | ICD-10-CM | POA: Diagnosis not present

## 2014-08-19 LAB — POCT HEMOGLOBIN-HEMACUE: Hemoglobin: 10.4 g/dL — ABNORMAL LOW (ref 12.0–15.0)

## 2014-08-19 MED ORDER — EPOETIN ALFA 10000 UNIT/ML IJ SOLN
INTRAMUSCULAR | Status: AC
  Filled 2014-08-19: qty 1

## 2014-08-19 MED ORDER — EPOETIN ALFA 10000 UNIT/ML IJ SOLN
10000.0000 [IU] | INTRAMUSCULAR | Status: DC
  Administered 2014-08-19: 10000 [IU] via SUBCUTANEOUS

## 2014-08-23 DIAGNOSIS — D638 Anemia in other chronic diseases classified elsewhere: Secondary | ICD-10-CM | POA: Diagnosis not present

## 2014-08-23 DIAGNOSIS — N183 Chronic kidney disease, stage 3 (moderate): Secondary | ICD-10-CM | POA: Diagnosis not present

## 2014-08-23 DIAGNOSIS — E889 Metabolic disorder, unspecified: Secondary | ICD-10-CM | POA: Diagnosis not present

## 2014-08-26 ENCOUNTER — Encounter (HOSPITAL_COMMUNITY)
Admission: RE | Admit: 2014-08-26 | Discharge: 2014-08-26 | Disposition: A | Discharge status: Home / Self Care | Payer: Medicare Other | Source: Ambulatory Visit | Attending: Nephrology | Admitting: Nephrology

## 2014-08-26 DIAGNOSIS — D631 Anemia in chronic kidney disease: Secondary | ICD-10-CM | POA: Insufficient documentation

## 2014-08-26 DIAGNOSIS — N183 Chronic kidney disease, stage 3 (moderate): Secondary | ICD-10-CM | POA: Insufficient documentation

## 2014-08-26 LAB — IRON AND TIBC
Iron: 63 ug/dL (ref 28–170)
Saturation Ratios: 29 % (ref 10.4–31.8)
TIBC: 220 ug/dL — ABNORMAL LOW (ref 250–450)
UIBC: 157 ug/dL

## 2014-08-26 LAB — POCT HEMOGLOBIN-HEMACUE: Hemoglobin: 10.2 g/dL — ABNORMAL LOW (ref 12.0–15.0)

## 2014-08-26 LAB — FERRITIN: FERRITIN: 126 ng/mL (ref 11–307)

## 2014-08-26 MED ORDER — EPOETIN ALFA 10000 UNIT/ML IJ SOLN
10000.0000 [IU] | INTRAMUSCULAR | Status: DC
  Administered 2014-08-26: 10000 [IU] via SUBCUTANEOUS

## 2014-08-26 MED ORDER — EPOETIN ALFA 10000 UNIT/ML IJ SOLN
INTRAMUSCULAR | Status: AC
  Filled 2014-08-26: qty 1

## 2014-08-29 DIAGNOSIS — D638 Anemia in other chronic diseases classified elsewhere: Secondary | ICD-10-CM | POA: Diagnosis not present

## 2014-08-29 DIAGNOSIS — E889 Metabolic disorder, unspecified: Secondary | ICD-10-CM | POA: Diagnosis not present

## 2014-08-29 DIAGNOSIS — I10 Essential (primary) hypertension: Secondary | ICD-10-CM | POA: Diagnosis not present

## 2014-08-29 DIAGNOSIS — N183 Chronic kidney disease, stage 3 (moderate): Secondary | ICD-10-CM | POA: Diagnosis not present

## 2014-09-02 ENCOUNTER — Encounter (HOSPITAL_COMMUNITY)
Admission: RE | Admit: 2014-09-02 | Discharge: 2014-09-02 | Disposition: A | Discharge status: Home / Self Care | Payer: Medicare Other | Source: Ambulatory Visit | Attending: Nephrology | Admitting: Nephrology

## 2014-09-02 DIAGNOSIS — N183 Chronic kidney disease, stage 3 (moderate): Secondary | ICD-10-CM | POA: Diagnosis not present

## 2014-09-02 DIAGNOSIS — D631 Anemia in chronic kidney disease: Secondary | ICD-10-CM | POA: Diagnosis not present

## 2014-09-02 LAB — POCT HEMOGLOBIN-HEMACUE: Hemoglobin: 9.7 g/dL — ABNORMAL LOW (ref 12.0–15.0)

## 2014-09-02 MED ORDER — EPOETIN ALFA 10000 UNIT/ML IJ SOLN
INTRAMUSCULAR | Status: AC
  Administered 2014-09-02: 10000 [IU] via SUBCUTANEOUS
  Filled 2014-09-02: qty 1

## 2014-09-02 MED ORDER — EPOETIN ALFA 10000 UNIT/ML IJ SOLN: 10000.0000 [IU] | INTRAMUSCULAR | Status: DC

## 2014-09-09 ENCOUNTER — Encounter (HOSPITAL_COMMUNITY)
Admission: RE | Admit: 2014-09-09 | Discharge: 2014-09-09 | Disposition: A | Discharge status: Home / Self Care | Payer: Medicare Other | Source: Ambulatory Visit | Attending: Nephrology | Admitting: Nephrology

## 2014-09-09 DIAGNOSIS — N183 Chronic kidney disease, stage 3 (moderate): Secondary | ICD-10-CM | POA: Diagnosis not present

## 2014-09-09 DIAGNOSIS — D631 Anemia in chronic kidney disease: Secondary | ICD-10-CM | POA: Diagnosis not present

## 2014-09-09 LAB — POCT HEMOGLOBIN-HEMACUE: Hemoglobin: 9.4 g/dL — ABNORMAL LOW (ref 12.0–15.0)

## 2014-09-09 MED ORDER — EPOETIN ALFA 10000 UNIT/ML IJ SOLN
INTRAMUSCULAR | Status: AC
  Filled 2014-09-09: qty 1

## 2014-09-09 MED ORDER — EPOETIN ALFA 10000 UNIT/ML IJ SOLN
10000.0000 [IU] | INTRAMUSCULAR | Status: DC
  Administered 2014-09-09: 10000 [IU] via SUBCUTANEOUS

## 2014-09-13 ENCOUNTER — Other Ambulatory Visit: Payer: Self-pay | Admitting: Adult Health

## 2014-09-16 ENCOUNTER — Encounter (HOSPITAL_COMMUNITY)
Admission: RE | Admit: 2014-09-16 | Discharge: 2014-09-16 | Disposition: A | Discharge status: Home / Self Care | Payer: Medicare Other | Source: Ambulatory Visit | Attending: Nephrology | Admitting: Nephrology

## 2014-09-16 DIAGNOSIS — D631 Anemia in chronic kidney disease: Secondary | ICD-10-CM | POA: Diagnosis not present

## 2014-09-16 DIAGNOSIS — N183 Chronic kidney disease, stage 3 (moderate): Secondary | ICD-10-CM | POA: Diagnosis not present

## 2014-09-16 LAB — POCT HEMOGLOBIN-HEMACUE: Hemoglobin: 9.7 g/dL — ABNORMAL LOW (ref 12.0–15.0)

## 2014-09-16 MED ORDER — EPOETIN ALFA 10000 UNIT/ML IJ SOLN
INTRAMUSCULAR | Status: AC
  Filled 2014-09-16: qty 1

## 2014-09-16 MED ORDER — EPOETIN ALFA 10000 UNIT/ML IJ SOLN
10000.0000 [IU] | INTRAMUSCULAR | Status: DC
  Administered 2014-09-16: 10000 [IU] via SUBCUTANEOUS

## 2014-09-22 ENCOUNTER — Ambulatory Visit (INDEPENDENT_AMBULATORY_CARE_PROVIDER_SITE_OTHER): Payer: Medicare Other | Admitting: Internal Medicine

## 2014-09-22 ENCOUNTER — Other Ambulatory Visit (HOSPITAL_COMMUNITY): Payer: Self-pay | Admitting: *Deleted

## 2014-09-22 ENCOUNTER — Encounter: Payer: Self-pay | Admitting: Internal Medicine

## 2014-09-22 ENCOUNTER — Telehealth: Payer: Self-pay | Admitting: Gastroenterology

## 2014-09-22 ENCOUNTER — Ambulatory Visit (INDEPENDENT_AMBULATORY_CARE_PROVIDER_SITE_OTHER)
Admission: RE | Admit: 2014-09-22 | Discharge: 2014-09-22 | Disposition: A | Discharge status: Home / Self Care | Payer: Medicare Other | Source: Ambulatory Visit | Attending: Internal Medicine | Admitting: Internal Medicine

## 2014-09-22 VITALS — BP 140/70 | HR 83 | Ht 64.0 in | Wt 288.0 lb

## 2014-09-22 DIAGNOSIS — R0600 Dyspnea, unspecified: Secondary | ICD-10-CM | POA: Diagnosis not present

## 2014-09-22 DIAGNOSIS — R079 Chest pain, unspecified: Secondary | ICD-10-CM | POA: Diagnosis not present

## 2014-09-22 DIAGNOSIS — R05 Cough: Secondary | ICD-10-CM | POA: Diagnosis not present

## 2014-09-22 DIAGNOSIS — N183 Chronic kidney disease, stage 3 (moderate): Secondary | ICD-10-CM | POA: Diagnosis not present

## 2014-09-22 DIAGNOSIS — J45909 Unspecified asthma, uncomplicated: Secondary | ICD-10-CM | POA: Diagnosis not present

## 2014-09-22 NOTE — Patient Outreach (Signed)
[place]) Care Management  09/22/2014  Joann Gutierrez [birth year] [id number]   Referral from [employer] tier 3 list, assigned to [name], [facility] to outreach patient.  Joann Gutierrez, [place] Care Management Assistant

## 2014-09-22 NOTE — Assessment & Plan Note (Addendum)
09/22/2014   Walked RA x one lap @ 185 stopped due to  Sob/ slow pace, no desat  - rec max gerd rx including reglan 09/22/2014 >>>   Symptoms are markedly disproportionate to objective findings and not clear this is a lung problem but pt does appear to have difficult airway management issues.   DDX of  difficult airways management all start with A and  include Adherence, Ace Inhibitors, Acid Reflux, Active Sinus Disease, Alpha 1 Antitripsin deficiency, Anxiety masquerading as Airways dz,  ABPA,  allergy(esp in young), Aspiration (esp in elderly), Adverse effects of meds,  Active smokers, A bunch of PE's (a small clot burden can't cause this syndrome unless there is already severe underlying pulm or vascular dz with poor reserve) plus two Bs  = Bronchiectasis and Beta blocker use..and one C= CHF   Adherence is always the initial "prime suspect" and is a multilayered concern that requires a "trust but verify" approach in every patient - starting with knowing how to use medications, especially inhalers, correctly, keeping up with refills and understanding the fundamental difference between maintenance and prns vs those medications only taken for a very short course and then stopped and not refilled.  - really confused with meds:  To keep things simple, I have asked the patient to first separate medicines that are perceived as maintenance, that is to be taken daily "no matter what", from those medicines that are taken on only on an as-needed basis and I have given the patient examples of both, and then return to see our NP to generate a  detailed  medication calendar which should be followed until the next physician sees the patient and updates it.     ? Acid (or non-acid) GERD > always difficult to exclude as up to 75% of pts in some series report no assoc GI/ Heartburn symptoms> rec max (24h)  acid suppression and rechallenge with reglan  and diet restrictions/ reviewed and instructions given in writing.   ?  Anxiety related to severe obesity/deconditioning also likely contributing    I had an extended discussion with the patient reviewing all relevant studies completed to date and  lasting 15 to 20 minutes of a 25 minute visit    Each maintenance medication was reviewed in detail including most importantly the difference between maintenance and prns and under what circumstances the prns are to be triggered using an action plan format that is not reflected in the computer generated alphabetically organized AVS.    Please see instructions for details which were reviewed in writing and the patient given a copy highlighting the part that I personally wrote and discussed at today's ov.

## 2014-09-22 NOTE — Progress Notes (Signed)
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   TESTS: PSG 04/28/09 >> AHI 11, SpO2 low 68% Spirometry 07/05/10 >> FEV1 1.30 (64%), FEV1% 80 >> coughing during test Echo 05/21/11 >> EF 55 to 60%, mild LVH, mild/mod TR, PAS 45 mmHg ONO with CPAP and room air 10/22/12 >> Test time 10 hrs 59 min. Mean SpO2 92.4%, low SpO2 75%. Spent 16 min with SpO2 < 89%. RAST 12/11/12 >> negative, IgE 63.3 Echo 02/04/13 >> mod LVH, EF 65 to [id number], grade 1 diastolic dysfx, PAS 42 mmHg Lt, Rt Heart cath 03/03/13 >> RA 10, RV 38/7, PA 41/16 28, PCWP 13, LV 142/5, AO 137/76 PFT 03/17/13 >> FEV1 1.27 (67%), FEV1% 84, TLC 4.06 (80%), DLCO 57%, borderline BD  03/04/2014 Follow up  Patient returns for a two-week follow-up Patient was seen for a COPD flare last visit She was treated with a Z-Pak and a prednisone taper. Chest x-ray showed no acute pneumonia,  with some mild CHF. She was recommended stay on Lasix Overall she does feel better. She remains on C Pap at bedtime, says that she wears all night every night She denies any mask issues. rec No change rx   05/20/2014 acute  ov/Joann Gutierrez re: aeAB  Chief Complaint  Patient presents with  . Acute Visit    Pt c/o cough, wheezing, increased SOB and chest tightness for the past 5 days. Cough is prod with moderate green sputum. She is using albuterol inhaler 2 x daily on average.  normally uses nebulizer twice daily but has not worked for a month "making an odd sound" Thoroughly confused with meds/ names/ timing maint vs prn and worse at hs despite cpap. Still comfortable at rest even s saba  rec Augmentin 875 mg take one pill twice daily  X 10 days - take at breakfast and supper with large glass of water.  It would help reduce the usual side effects (diarrhea and yeast infections) if you  ate cultured yogurt at lunch.  Prednisone 10 mg take  4 each am x 2 days,   2 each am x 2 days,  1 each am x 2 days and stop If needed nebulizer up to every 4 hours if needed for breathlessness  For cough use flutter valve      09/22/2014 f/u ov/Joann Gutierrez re: uacs  /gastroparesis related ?  With no improvement is sob since last eval  Chief Complaint  Patient presents with  . Acute Visit    Pt c/o increased SOB "since the last time I was here". She also c/o difficulty swallowing and getting choked on food for the past month.   all  symptoms resolve with cpap day or noct but doe is persistent daily then choking worse x one month Not at all clear what meds she takes / was on reglan not clear when she stopped it.   No obvious day to day or daytime variabilty or assoc excess/ purulent sputum production or cp or chest tightness overt sinus or hb symptoms. No unusual exp hx or h/o childhood pna/ asthma or knowledge of premature birth.  Sleeping ok without nocturnal  or early am exacerbation  of respiratory  c/o's or need for noct saba. Also denies any obvious fluctuation of symptoms with weather or environmental  changes or other aggravating or alleviating factors except as outlined above   Current Medications, Allergies, Complete Past Medical History, Past Surgical History, Family History, and Social History were reviewed in [employer] record.  ROS  The following are not active complaints unless bolded sore throat, dysphagia, dental problems, itching, sneezing,  nasal congestion or excess/ purulent secretions, ear ache,   fever, chills, sweats, unintended wt loss, pleuritic or exertional cp, hemoptysis,  orthopnea pnd or leg swelling, presyncope, palpitations, heartburn, abdominal pain, anorexia, nausea, vomiting, diarrhea  or change in bowel or urinary habits, change in stools or urine, dysuria,hematuria,  rash, arthralgias, visual complaints, headache, numbness weakness or ataxia  or problems with walking or coordination,  change in mood/affect or memory.            Objective:   Physical Exam  09/22/2014          288 Wt Readings from Last 3 Encounters:  05/20/14 278 lb (126.1 kg)  04/06/14 267 lb (121.11 kg)  03/04/14 267 lb 12.8 oz (121.473 kg)    Vital signs reviewed     Gen.   Obese,anxious amb bf ok at rest  ENT - no lesions, no post nasal drip, class 2-3 MPairway Neck: No JVD, no thyromegaly, no carotid bruits Lungs: no use of accessory muscles, pseudowheeze only  Cardiovascular: Rhythm regular, heart sounds  normal, no murmurs or gallops, trace to 1+  peripheral edema Abdomen: soft and non-tender, no hepatosplenomegaly, BS normal. Musculoskeletal: No deformities, no cyanosis or clubbing Neuro:  alert, non focal, no tremors                  Assessment & Plan:

## 2014-09-22 NOTE — Patient Instructions (Addendum)
metaclopromide 10 mg (reglan) is 10 mg before meals and at bedtime  Omeprazole 20 mg Take 30- 60 min before your first and last meals of the day   Please remember to go to the x-ray department downstairs for your tests - we will call you with the results when they are available.  GERD (REFLUX)  is an extremely common cause of respiratory symptoms just like yours , many times with no obvious heartburn at all.    It can be treated with medication, but also with lifestyle changes including elevation of the head of your bed (ideally with 6 inch  bed blocks),  Smoking cessation, avoidance of late meals, excessive alcohol, and avoid fatty foods, chocolate, peppermint, colas, red wine, and acidic juices such as orange juice.  NO MINT OR MENTHOL PRODUCTS SO NO COUGH DROPS  USE SUGARLESS CANDY INSTEAD (Jolley ranchers or Stover's or Life Savers) or even ice chips will also do - the key is to swallow to prevent all throat clearing. NO OIL BASED VITAMINS - use powdered substitutes.    See Tammy NP w/in 2 weeks with all your medications, even over the counter meds, separated in two separate bags, the ones you take no matter what vs the ones you stop once you feel better and take only as needed when you feel you need them.   Tammy  will generate for you a new user friendly medication calendar that will put [place] all on the same page re: your medication use.     Without this process, it simply isn't possible to assure that we are providing  your outpatient care  with  the attention to detail we feel you deserve.   If we cannot assure that you're getting that kind of care,  then we cannot manage your problem effectively from this clinic.  Once you have seen Tammy and we are sure that we're all on the same page with your medication use she will arrange follow up with Dr Halford Chessman .  Late add: no recent cxr on file, rec do one on return

## 2014-09-22 NOTE — Telephone Encounter (Signed)
appt scheduled. Patient aware. 

## 2014-09-23 ENCOUNTER — Encounter: Payer: Self-pay | Admitting: Internal Medicine

## 2014-09-23 ENCOUNTER — Encounter (HOSPITAL_COMMUNITY)
Admission: RE | Admit: 2014-09-23 | Discharge: 2014-09-23 | Disposition: A | Discharge status: Home / Self Care | Payer: Medicare Other | Source: Ambulatory Visit | Attending: Nephrology | Admitting: Nephrology

## 2014-09-23 ENCOUNTER — Other Ambulatory Visit: Payer: Self-pay | Admitting: Internal Medicine

## 2014-09-23 DIAGNOSIS — N183 Chronic kidney disease, stage 3 (moderate): Secondary | ICD-10-CM | POA: Insufficient documentation

## 2014-09-23 DIAGNOSIS — D631 Anemia in chronic kidney disease: Secondary | ICD-10-CM | POA: Insufficient documentation

## 2014-09-23 LAB — IRON AND TIBC
Iron: 60 ug/dL (ref 28–170)
Saturation Ratios: 25 % (ref 10.4–31.8)
TIBC: 238 ug/dL — AB (ref 250–450)
UIBC: 178 ug/dL

## 2014-09-23 LAB — POCT HEMOGLOBIN-HEMACUE: HEMOGLOBIN: 9.9 g/dL — AB (ref 12.0–15.0)

## 2014-09-23 LAB — FERRITIN: Ferritin: 114 ng/mL (ref 11–307)

## 2014-09-23 MED ORDER — EPOETIN ALFA 20000 UNIT/ML IJ SOLN
20000.0000 [IU] | INTRAMUSCULAR | Status: DC
  Administered 2014-09-23: 20000 [IU] via SUBCUTANEOUS

## 2014-09-23 MED ORDER — EPOETIN ALFA 20000 UNIT/ML IJ SOLN
INTRAMUSCULAR | Status: AC
  Filled 2014-09-23: qty 1

## 2014-09-23 MED ORDER — METOCLOPRAMIDE HCL 10 MG PO TABS: ORAL_TABLET | ORAL | Status: DC

## 2014-09-23 MED ORDER — EPOETIN ALFA 10000 UNIT/ML IJ SOLN
INTRAMUSCULAR | Status: AC
  Filled 2014-09-23: qty 1

## 2014-09-23 NOTE — Assessment & Plan Note (Signed)
Body mass index is 49.41 kg/(m^2).  Lab Results  Component Value Date   TSH 1.85 05/20/2014     Contributing to gerd tendency/ doe/reviewed need  achieve and maintain neg calorie balance > defer f/u primary care including intermittently monitoring thyroid status

## 2014-09-29 ENCOUNTER — Telehealth: Payer: Self-pay | Admitting: Pulmonary Disease

## 2014-09-29 NOTE — Telephone Encounter (Signed)
lmtcb

## 2014-09-30 ENCOUNTER — Encounter (HOSPITAL_COMMUNITY)
Admission: RE | Admit: 2014-09-30 | Discharge: 2014-09-30 | Disposition: A | Discharge status: Home / Self Care | Payer: Medicare Other | Source: Ambulatory Visit | Attending: Nephrology | Admitting: Nephrology

## 2014-09-30 DIAGNOSIS — N183 Chronic kidney disease, stage 3 (moderate): Secondary | ICD-10-CM | POA: Diagnosis not present

## 2014-09-30 DIAGNOSIS — D631 Anemia in chronic kidney disease: Secondary | ICD-10-CM | POA: Diagnosis not present

## 2014-09-30 LAB — POCT HEMOGLOBIN-HEMACUE: HEMOGLOBIN: 9.6 g/dL — AB (ref 12.0–15.0)

## 2014-09-30 MED ORDER — EPOETIN ALFA 20000 UNIT/ML IJ SOLN
INTRAMUSCULAR | Status: AC
  Filled 2014-09-30: qty 1

## 2014-09-30 MED ORDER — EPOETIN ALFA 20000 UNIT/ML IJ SOLN
20000.0000 [IU] | INTRAMUSCULAR | Status: DC
  Administered 2014-09-30: 20000 [IU] via SUBCUTANEOUS

## 2014-09-30 NOTE — Telephone Encounter (Signed)
Spoke with pt and she states she never received the new nebulizer that was ordered on 05/20/14.  Spoke with Melissa at [facility] and she states she doesn't have record of receiving this order.  [facility] could you look at this order to see if it can be resent?  Thanks

## 2014-09-30 NOTE — Telephone Encounter (Signed)
The Referral never came through to the [facility] box and it was showing as closed by Dr. Melvyn Novas.  I have sent a staff message to Melissa @[facility]  to get the patient set up with her nebulizer machine.  LMTCB on patient's home and cell number to let her know that [facility] should be contacting her in a few days about this.

## 2014-09-30 NOTE — Telephone Encounter (Signed)
Patient returned my call.  I advised her that for some reason we never received the order from Dr. Melvyn Novas for her new nebulizer machine.  However, I have gone in and pulled this Order and sent it to [facility].  I advised patient that [facility] should be contacting her soon regarding this.

## 2014-10-03 ENCOUNTER — Other Ambulatory Visit: Payer: Self-pay

## 2014-10-03 DIAGNOSIS — J441 Chronic obstructive pulmonary disease with (acute) exacerbation: Secondary | ICD-10-CM

## 2014-10-03 NOTE — Patient Outreach (Signed)
This RNCM was successful in making telephone contact with patient who identified herself using HIPPA identifiers of date of birth and address.   Patient was agreeable to this initial assessment. This RNCM reviewed information with patient about the benefits offered by [facility], referral process and Patient Rights. Patient and RNCM discussed barriers to chronic disease management including her feelings of depression.  Patient's initial depression screening indicated need for further intervention.  In addition, the following were identified as barriers:  ADL/IADL Deficit; Patient states she is on oxygen at 2 liters/min. Patient states her housemates, Mr. [name], assist her with her ADLs/IADLs because she tires easily.  FALL: Patient has over 3 falls in the past year, utilizes a walker for mobility support.  Depression: Initial Depression Screening completed. LCSW referral made for psychosocial support.      Plan: initial home visit later this month for continuation of assessment of community resource needs.

## 2014-10-03 NOTE — Patient Outreach (Signed)
[facility]) Care Management  10/03/2014  Joann Gutierrez 22-Dec-1945 [id number]   Request from [name], RN to assign SW, assigned [employer], LCSW.  Thanks, [name]. [place], [place] Assistant Phone: [phone] Fax: [phone]

## 2014-10-06 ENCOUNTER — Other Ambulatory Visit: Payer: Self-pay | Admitting: *Deleted

## 2014-10-06 ENCOUNTER — Encounter: Payer: Self-pay | Admitting: *Deleted

## 2014-10-06 NOTE — Patient Outreach (Signed)
[place] [facility]) Care Management  10/06/2014  Joann Gutierrez 1945-03-25 [id number]   CSW received a new referral on patient from [name], [facility] with [facility], reporting that patient scored positive on her Depression Screening.  Mrs. [name] requested that CSW contact patient to offer counseling and supportive services, as well as a possible referral for psychiatry to perform medication management and psychotherapy.  A Referral has also been made to pharmacy for medication assistance. CSW made an initial attempt to try and contact patient today to perform phone assessment, as well as assess and assist with social work needs and services; however, patient was unavailable.  A HIPAA compliant message was left on voicemail.  CSW is currently awaiting a return call.  CSW will prescribe and print EMMI information for patient to review at the initial home visit.  CSW will fax a barriers letter to patient's Primary Care Physician, Dr. Seward Carol.  Nat Christen, BSW, MSW, LCSW  Licensed Education officer, [facility]  Mailing [place] [address], [place], [place] [address] Physical Address-[address], [place], [place] [address] Toll Free Main # [phone] Fax # [phone] Cell # [phone]  Fax # [phone]  [email or web address]

## 2014-10-07 ENCOUNTER — Encounter: Payer: Self-pay | Admitting: Gastroenterology

## 2014-10-07 ENCOUNTER — Encounter (HOSPITAL_COMMUNITY)
Admission: RE | Admit: 2014-10-07 | Discharge: 2014-10-07 | Disposition: A | Discharge status: Home / Self Care | Payer: Medicare Other | Source: Ambulatory Visit | Attending: Nephrology | Admitting: Nephrology

## 2014-10-07 ENCOUNTER — Ambulatory Visit: Payer: Medicare Other | Admitting: Podiatry

## 2014-10-07 DIAGNOSIS — D631 Anemia in chronic kidney disease: Secondary | ICD-10-CM | POA: Diagnosis not present

## 2014-10-07 DIAGNOSIS — N183 Chronic kidney disease, stage 3 (moderate): Secondary | ICD-10-CM | POA: Diagnosis not present

## 2014-10-07 LAB — POCT HEMOGLOBIN-HEMACUE: HEMOGLOBIN: 10.4 g/dL — AB (ref 12.0–15.0)

## 2014-10-07 MED ORDER — EPOETIN ALFA 20000 UNIT/ML IJ SOLN
20000.0000 [IU] | INTRAMUSCULAR | Status: DC
  Administered 2014-10-07: 20000 [IU] via SUBCUTANEOUS

## 2014-10-07 MED ORDER — EPOETIN ALFA 20000 UNIT/ML IJ SOLN
INTRAMUSCULAR | Status: AC
  Filled 2014-10-07: qty 1

## 2014-10-11 ENCOUNTER — Encounter: Payer: Self-pay | Admitting: Gastroenterology

## 2014-10-11 ENCOUNTER — Telehealth: Payer: Self-pay | Admitting: *Deleted

## 2014-10-11 ENCOUNTER — Other Ambulatory Visit: Payer: Self-pay | Admitting: *Deleted

## 2014-10-11 ENCOUNTER — Ambulatory Visit (INDEPENDENT_AMBULATORY_CARE_PROVIDER_SITE_OTHER): Payer: Medicare Other | Admitting: Gastroenterology

## 2014-10-11 ENCOUNTER — Encounter: Payer: Medicare Other | Admitting: Adult Health

## 2014-10-11 VITALS — BP 160/90 | HR 82 | Ht 64.0 in | Wt 283.0 lb

## 2014-10-11 DIAGNOSIS — R6889 Other general symptoms and signs: Secondary | ICD-10-CM | POA: Diagnosis not present

## 2014-10-11 DIAGNOSIS — R1310 Dysphagia, unspecified: Secondary | ICD-10-CM

## 2014-10-11 DIAGNOSIS — K3184 Gastroparesis: Secondary | ICD-10-CM | POA: Diagnosis not present

## 2014-10-11 NOTE — Telephone Encounter (Signed)
LM for the patient for the Barium Esophagram appointment at [facility], 10-18-2014. Arrive at 11:15 am.  Do not have any food or drink after 8:30 am. Take medicines before 8:30 am.

## 2014-10-11 NOTE — Patient Instructions (Addendum)
We will call you with the appointment for the Barium Swallow.   You have been scheduled for a Barium Esophogram at [facility] (1st floor of the hospital) on  10-18-2014 at 11:30 am . Please arrive 15 minutes prior to your appointment for registration. Make certain not to have anything to eat or drink 3 hours prior to your test. If you need to reschedule for any reason, please contact radiology at [phone] to do so. __________________________________________________________________ A barium swallow is an examination that concentrates on views of the esophagus. This tends to be a double contrast exam (barium and two liquids which, when combined, create a gas to distend the wall of the oesophagus) or single contrast (non-ionic iodine based). The study is usually tailored to your symptoms so a good history is essential. Attention is paid during the study to the form, structure and configuration of the esophagus, looking for functional disorders (such as aspiration, dysphagia, achalasia, motility and reflux) EXAMINATION You may be asked to change into a gown, depending on the type of swallow being performed. A radiologist and radiographer will perform the procedure. The radiologist will advise you of the type of contrast selected for your procedure and direct you during the exam. You will be asked to stand, sit or lie in several different positions and to hold a small amount of fluid in your mouth before being asked to swallow while the imaging is performed .In some instances you may be asked to swallow barium coated marshmallows to assess the motility of a solid food bolus. The exam can be recorded as a digital or video fluoroscopy procedure. POST PROCEDURE It will take 1-2 days for the barium to pass through your system. To facilitate this, it is important, unless otherwise directed, to increase your fluids for the next 24-48hrs and to resume your normal diet.  This test typically takes about  30 minutes to perform. __________________________________________________________________________________

## 2014-10-11 NOTE — Progress Notes (Signed)
     10/11/2014 Joann Gutierrez [id number] 1945-11-15   History of Present Illness:  This is a 69 year old female who is known to Dr. Deatra Ina for gastroparesis and recurrent complaints of dysphagia. She has multiple medical problems.  She underwent EGD March 2015 at which time she did not have any stricture, but esophagus was dilated empirically.  She had moderate response to the esophageal dilation.  Returns to our office today with complaints of recurrent dysphagia. Complains of food getting stuck, but also continues to complain of a lot of throat clearing, a lot of mucus, and pain up into her neck and her ears. She is on only omeprazole 20 mg daily, but also takes Reglan 10 mg with meals and at bedtime.  Also has carafate suspension 10 mL's twice daily on her med list.    Current Medications, Allergies, Past Medical History, Past Surgical History, Family History and Social History were reviewed in [employer] record.   Physical Exam: BP 160/90 mmHg  Pulse 82  Ht 5\' 4"  (1.626 m)  Wt 283 lb (128.368 kg)  BMI 48.55 kg/m2 General: Well developed black female in no acute distress Head: Normocephalic and atraumatic Eyes:  Sclerae anicteric, conjunctiva pink  Ears: Normal auditory acuity Lungs: Clear throughout to auscultation Heart: Regular rate and rhythm Abdomen: Soft, non-distended.  Normal bowel sounds.  Non-tender. Musculoskeletal: Symmetrical with no gross deformities  Extremities: No edema  Neurological: Alert oriented x 4, grossly non-focal Psychological:  Alert and cooperative. Normal mood and affect  Assessment and Recommendations: -Dysphagia, recurrent/throat congestion:  Had EGD with dilation in 2015 with some relief of symptoms but no definite stricture seen.  Patient at increased risk for procedures.  Will schedule barium esophagram for assessment prior to making any decision about repeat EGD.  *Just of note, patient was 25 minutes late for her  10:00 appt here and then was anxious because she had an appt at primary care downstairs at 11:00 that she was trying to get to as well.

## 2014-10-11 NOTE — Patient Outreach (Signed)
[facility]) Care Management  10/11/2014  JAYN CHRISTOPHE 07-17-1945 [id number]   CSW made a second attempt to try and contact patient today to perform phone assessment, as well as assess and assist with social work needs and services, again without success.  A HIPAA compliant message was left for patient on voicemail.  CSW is currently awaiting a return call.  In the meantime, CSW will mail patient a list of community agencies and resources that offer counseling and supportive services to individuals suffering from Depression.  CSW will also mail patient a list of gerontology counselors/therapists.  CSW will prescribe and print EMMI information for patient pertaining specifically to Depression.  Nat Christen, BSW, MSW, LCSW  Licensed Education officer, environmental Health System  Mailing [place] [address] [place], [place] [address] Physical Address-[address], [place], [place] [address] Toll Free Main # [phone] Fax # [phone] Cell # [phone]  Fax # [phone]  [email or web address]

## 2014-10-12 ENCOUNTER — Ambulatory Visit (INDEPENDENT_AMBULATORY_CARE_PROVIDER_SITE_OTHER): Payer: Medicare Other | Admitting: Podiatry

## 2014-10-12 ENCOUNTER — Encounter: Payer: Self-pay | Admitting: Podiatry

## 2014-10-12 VITALS — Ht 64.0 in | Wt 283.0 lb

## 2014-10-12 DIAGNOSIS — B351 Tinea unguium: Secondary | ICD-10-CM | POA: Diagnosis not present

## 2014-10-12 DIAGNOSIS — M79606 Pain in leg, unspecified: Secondary | ICD-10-CM | POA: Diagnosis not present

## 2014-10-12 NOTE — Progress Notes (Signed)
Subjective: 69 y.o. year old diabetic female patient presents walking with a cane complaining of painful nails. Patient is wearing worn out flat dress shoes without socks. Patient requests toe nails trimmed.  Blood sugar this morning was 112.  Objective: Dermatologic:   Hypertrophic nails x 10.  No plantar lesions, no abnormal skin lesions noted.  Vascular:  All pedal pulses are palpable bilateral.  No pedal edema noted.  Orthopedic:  Mild hallux valgus with bunion on left foot.  Neurologic:  All epicritic and tactile sensations grossly intact.   Assessment:  Dystrophic mycotic nails x 10.  IDDM, under control.  Painful feet.   Treatment: All mycotic nails debrided.  Reviewed proper shoe gear and socks.  Return in 3 months or as needed

## 2014-10-12 NOTE — Progress Notes (Signed)
Reviewed and agree with management. Robert D. Kaplan, M.D., FACG  

## 2014-10-12 NOTE — Patient Instructions (Signed)
Seen for hypertrophic nails. All nails debrided. Return in 3 months or as needed.  

## 2014-10-13 ENCOUNTER — Other Ambulatory Visit: Payer: Self-pay

## 2014-10-13 DIAGNOSIS — E1151 Type 2 diabetes mellitus with diabetic peripheral angiopathy without gangrene: Secondary | ICD-10-CM

## 2014-10-13 NOTE — Patient Outreach (Addendum)
[facility]) Care Management  10/13/2014  Joann Gutierrez 03/29/1945 [id number]   Initial home visit to assess paitent's need for community care coordination. Patient lives in single family home with caregiver, Mr. Clint Guy.  [name] has multiple health issues himself by his own admission.  Patient has been approved for [facility]AT and uses services at times.  Patient states her sister is a school bus driver, takes her to her appointment on days she can.  Patient is a diabetes on insulin and checks her blood glucose levels daily.  Patient demonstrates a very good technique in administering her insulin using pen. Patient reports Mr..McWilliams assist her as needed, if she has a problem administering the medication.   During this initial home visit, patient and RNCM determined the following were barriers to health maintenance:  COPD: Patient states she gets very winded when ambulating for short distances, usually spend time sitting in chair in living room watching television. Paitent  Wears a CPAP at night Patient has nebulizer and oral medicaitons to assist with COPD management.  Diabetes: paitient has diabetes, test blood sugar at least once per day Paitent is very sedentary Referral [facility] to [facility] for Diabetic education.   CHF: Patient has 2-3 + pitting edema, is on diuretic. Patient to elevate her feel when sitting on chair. Patient is to start weighing daily, call primary care physician if there is a weight gain of 3 pounds in 24 hours.   Patient and this RNCM reviewed foods high in sodium including canned and process foods.  Patient advised to use fresh or frozen vegetables   Patient is on multiple prescribed medications and over the counter vitamins..  Requested pharmacy referral to review for contraindications and for educaiton.   Patient agreed to view the following EMMI videos including: COPD Diabetes Fall  Prevention and Safety

## 2014-10-14 ENCOUNTER — Encounter (HOSPITAL_COMMUNITY)
Admission: RE | Admit: 2014-10-14 | Discharge: 2014-10-14 | Disposition: A | Discharge status: Home / Self Care | Payer: Medicare Other | Source: Ambulatory Visit | Attending: Nephrology | Admitting: Nephrology

## 2014-10-14 DIAGNOSIS — N183 Chronic kidney disease, stage 3 (moderate): Secondary | ICD-10-CM | POA: Diagnosis not present

## 2014-10-14 DIAGNOSIS — D631 Anemia in chronic kidney disease: Secondary | ICD-10-CM | POA: Diagnosis not present

## 2014-10-14 LAB — POCT HEMOGLOBIN-HEMACUE: Hemoglobin: 10.5 g/dL — ABNORMAL LOW (ref 12.0–15.0)

## 2014-10-14 MED ORDER — EPOETIN ALFA 20000 UNIT/ML IJ SOLN
20000.0000 [IU] | INTRAMUSCULAR | Status: DC
  Administered 2014-10-14: 20000 [IU] via SUBCUTANEOUS

## 2014-10-14 MED ORDER — EPOETIN ALFA 20000 UNIT/ML IJ SOLN
INTRAMUSCULAR | Status: AC
  Filled 2014-10-14: qty 1

## 2014-10-14 NOTE — Patient Outreach (Signed)
[facility]) Care Management  10/14/2014  Joann Gutierrez May 15, 1945 [id number]   Request from [name], RN to assign Pharmacy, assigned Deanne Coffer, PharmD.  Thanks, [name]. [place], [place] Assistant Phone: [phone] Fax: [phone]

## 2014-10-18 ENCOUNTER — Encounter: Payer: Self-pay | Admitting: *Deleted

## 2014-10-18 ENCOUNTER — Other Ambulatory Visit: Payer: Self-pay | Admitting: *Deleted

## 2014-10-18 ENCOUNTER — Ambulatory Visit (HOSPITAL_COMMUNITY)
Admission: RE | Admit: 2014-10-18 | Discharge: 2014-10-18 | Disposition: A | Discharge status: Home / Self Care | Payer: Medicare Other | Source: Ambulatory Visit | Attending: Gastroenterology | Admitting: Gastroenterology

## 2014-10-18 DIAGNOSIS — K219 Gastro-esophageal reflux disease without esophagitis: Secondary | ICD-10-CM | POA: Insufficient documentation

## 2014-10-18 DIAGNOSIS — R1310 Dysphagia, unspecified: Secondary | ICD-10-CM | POA: Diagnosis present

## 2014-10-18 DIAGNOSIS — K224 Dyskinesia of esophagus: Secondary | ICD-10-CM | POA: Diagnosis not present

## 2014-10-18 NOTE — Patient Outreach (Signed)
[facility]) Care Management  10/18/2014  KALIYAN HOPPE 12-07-1945 [id number]   CSW made a third and final attempt to try and contact patient today to perform phone assessment, as well as assess and assist with social work needs and services, without success.  A HIPAA compliant message was left for patient on voicemail.  CSW continues to await a return call.  CSW will mail an outreach letter to patient's home, encouraging patient to contact CSW at her earliest convenience, if patient is interested in receiving social work services and resources through [place] with Scientist, clinical (histocompatibility and immunogenetics).  If CSW does not receive a return call from patient within the next 10 business days, CSW will proceed with case closure.  Required number of phone attempts will have been made and outreach letter mailed.    Nat Christen, BSW, MSW, LCSW  Licensed Education officer, environmental Health System  Mailing [place] [address] [place], [place] [address] Physical Address-[address], [place], [place] [address] Toll Free Main # [phone] Fax # [phone] Cell # [phone]  Fax # [phone]  [email or web address]

## 2014-10-21 ENCOUNTER — Encounter (HOSPITAL_COMMUNITY)
Admission: RE | Admit: 2014-10-21 | Discharge: 2014-10-21 | Disposition: A | Discharge status: Home / Self Care | Payer: Medicare Other | Source: Ambulatory Visit | Attending: Nephrology | Admitting: Nephrology

## 2014-10-21 ENCOUNTER — Ambulatory Visit (INDEPENDENT_AMBULATORY_CARE_PROVIDER_SITE_OTHER): Payer: Medicare Other | Admitting: Adult Health

## 2014-10-21 VITALS — BP 136/76 | HR 66 | Temp 98.2°F | Ht 64.0 in | Wt 278.0 lb

## 2014-10-21 DIAGNOSIS — N183 Chronic kidney disease, stage 3 (moderate): Secondary | ICD-10-CM | POA: Diagnosis not present

## 2014-10-21 DIAGNOSIS — J449 Chronic obstructive pulmonary disease, unspecified: Secondary | ICD-10-CM

## 2014-10-21 DIAGNOSIS — G4733 Obstructive sleep apnea (adult) (pediatric): Secondary | ICD-10-CM

## 2014-10-21 DIAGNOSIS — D631 Anemia in chronic kidney disease: Secondary | ICD-10-CM | POA: Diagnosis not present

## 2014-10-21 LAB — IRON AND TIBC
Iron: 57 ug/dL (ref 28–170)
SATURATION RATIOS: 27 % (ref 10.4–31.8)
TIBC: 211 ug/dL — AB (ref 250–450)
UIBC: 154 ug/dL

## 2014-10-21 LAB — POCT HEMOGLOBIN-HEMACUE: HEMOGLOBIN: 10.2 g/dL — AB (ref 12.0–15.0)

## 2014-10-21 LAB — FERRITIN: FERRITIN: 107 ng/mL (ref 11–307)

## 2014-10-21 MED ORDER — EPOETIN ALFA 20000 UNIT/ML IJ SOLN
20000.0000 [IU] | INTRAMUSCULAR | Status: DC
  Administered 2014-10-21: 20000 [IU] via SUBCUTANEOUS

## 2014-10-21 MED ORDER — EPOETIN ALFA 20000 UNIT/ML IJ SOLN
INTRAMUSCULAR | Status: AC
  Filled 2014-10-21: qty 1

## 2014-10-21 NOTE — Patient Instructions (Signed)
Continue on current regimen  Follow med calendar closely and bring to each visit.  GERD diet  Wear CPAP At bedtime   Do not drive if sleepy.  Work on weight loss.  follow up Dr. Melvyn Novas  In 3 months and As needed

## 2014-10-21 NOTE — Progress Notes (Signed)
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   TESTS: PSG 04/28/09 >> AHI 11, SpO2 low 68% Spirometry 07/05/10 >> FEV1 1.30 (64%), FEV1% 80 >> coughing during test Echo 05/21/11 >> EF 55 to 60%, mild LVH, mild/mod TR, PAS 45 mmHg ONO with CPAP and room air 10/22/12 >> Test time 10 hrs 59 min. Mean SpO2 92.4%, low SpO2 75%. Spent 16 min with SpO2 < 89%. RAST 12/11/12 >> negative, IgE 63.3 Echo 02/04/13 >> mod LVH, EF 65 to [id number], grade 1 diastolic dysfx, PAS 42 mmHg Lt, Rt Heart cath 03/03/13 >> RA 10, RV 38/7, PA 41/16 28, PCWP 13, LV 142/5, AO 137/76 PFT 03/17/13 >> FEV1 1.27 (67%), FEV1% 84, TLC 4.06 (80%), DLCO 57%, borderline BD  03/04/2014 Follow up  Patient returns for a two-week follow-up Patient was seen for a COPD flare last visit She was treated with a Z-Pak and a prednisone taper. Chest x-ray showed no acute pneumonia,  with some mild CHF. She was recommended stay on Lasix Overall she does feel better. She remains on C Pap at bedtime, says that she wears all night every night She denies any mask issues. rec No change rx   05/20/2014 acute  ov/Wert re: aeAB  Chief Complaint  Patient presents with  . Acute Visit    Pt c/o cough, wheezing, increased SOB and chest tightness for the past 5 days. Cough is prod with moderate Joann Gutierrez sputum. She is using albuterol inhaler 2 x daily on average.  normally uses nebulizer twice daily but has not worked for a month "making an odd sound" Thoroughly confused with meds/ names/ timing maint vs prn and worse at hs despite cpap. Still comfortable at rest even s saba  rec Augmentin 875 mg take one pill twice daily  X 10 days - take at breakfast and supper with large glass of water.  It would help reduce the usual side effects (diarrhea and yeast infections) if you  ate cultured yogurt at lunch.  Prednisone 10 mg take  4 each am x 2 days,   2 each am x 2 days,  1 each am x 2 days and stop If needed nebulizer up to every 4 hours if needed for breathlessness  For cough use flutter valve      09/22/2014 f/u ov/Wert re: uacs  /gastroparesis related ?  With no improvement is sob since last eval  Chief Complaint  Patient presents with  . Acute Visit    Pt c/o increased SOB "since the last time I was here". She also c/o difficulty swallowing and getting choked on food for the past month.   all  symptoms resolve with cpap day or noct but doe is persistent daily then choking worse x one month Not at all clear what meds she takes / was on reglan not clear when she stopped it. >>reglan added    10/21/2014 Follow up :  Pt returns for 1 month follow up  We reviewed all her meds and organized them into a med calendar with pt education.  Says overall does feel some better.  Dyspnea is at baseline.  Wearing CPAP At bedtime  .  Started on reglan last ov , unsure if helping with GERD . Following with GI .  Pt  denies chest pain , orthopnea, increased edema or fever.     Current Medications, Allergies, Complete Past Medical History, Past Surgical History, Family History, and Social History were reviewed in [employer] record.  ROS  The following are not active complaints unless bolded sore throat,, dental problems, itching, sneezing,  nasal congestion or excess/ purulent secretions, ear ache,   fever, chills, sweats, unintended wt loss, pleuritic or exertional cp, hemoptysis,  orthopnea pnd or leg swelling, presyncope, palpitations, heartburn, abdominal pain, anorexia, nausea, vomiting, diarrhea  or change in bowel or urinary habits, change in stools or urine, dysuria,hematuria,  rash, arthralgias, visual complaints, headache, numbness weakness or ataxia or problems with walking or coordination,  change in mood/affect or memory.              Objective:   Physical Exam  09/22/2014          288 >278 9/30 Vital signs reviewed     Gen.   Obese,anxious amb bf ok at rest  ENT - no lesions, no post nasal drip, class 2-3 MPairway Neck: No JVD, no thyromegaly, no carotid bruits Lungs: no use of accessory muscles, pseudowheeze only  Cardiovascular: Rhythm regular, heart sounds  normal, no murmurs or gallops, trace to 1+  peripheral edema Abdomen: soft and non-tender, no hepatosplenomegaly, BS normal. Musculoskeletal: No deformities, no cyanosis or clubbing Neuro:  alert, non focal, no tremors                  Assessment & Plan:

## 2014-10-27 ENCOUNTER — Other Ambulatory Visit: Payer: Self-pay | Admitting: Pharmacist

## 2014-10-27 NOTE — Assessment & Plan Note (Signed)
Wear CPAP At bedtime   Do not drive if sleepy.  Work on weight loss.  follow up Dr. Melvyn Novas  In 3 months and As needed

## 2014-10-27 NOTE — Assessment & Plan Note (Signed)
Continue on current regimen  Follow med calendar closely and bring to each visit.  GERD diet  follow up Dr. Melvyn Novas  In 3 months and As needed

## 2014-10-27 NOTE — Patient Outreach (Signed)
[facility]) Care Management  [place]   10/27/2014  CONNI BROUILLARD Apr 20, 1945 [id number]   Lanija Lomibao is a 69 year old referred to [place] from [facility], Loni Muse, for medication review and education.  I made initial outreach call to Ms. Rahn.  I scheduled an initial home visit for Wednesday, October 12th at 10:30AM.    [name], Pharm.D. Pharmacy Resident [employer] [phone]

## 2014-10-28 ENCOUNTER — Encounter (HOSPITAL_COMMUNITY)
Admission: RE | Admit: 2014-10-28 | Discharge: 2014-10-28 | Disposition: A | Discharge status: Home / Self Care | Payer: Medicare Other | Source: Ambulatory Visit | Attending: Nephrology | Admitting: Nephrology

## 2014-10-28 DIAGNOSIS — N183 Chronic kidney disease, stage 3 (moderate): Secondary | ICD-10-CM | POA: Diagnosis not present

## 2014-10-28 DIAGNOSIS — D631 Anemia in chronic kidney disease: Secondary | ICD-10-CM | POA: Insufficient documentation

## 2014-10-28 LAB — POCT HEMOGLOBIN-HEMACUE: Hemoglobin: 11 g/dL — ABNORMAL LOW (ref 12.0–15.0)

## 2014-10-28 MED ORDER — EPOETIN ALFA 20000 UNIT/ML IJ SOLN
20000.0000 [IU] | INTRAMUSCULAR | Status: DC
  Administered 2014-10-28: 20000 [IU] via SUBCUTANEOUS

## 2014-10-28 MED ORDER — EPOETIN ALFA 20000 UNIT/ML IJ SOLN
INTRAMUSCULAR | Status: AC
  Filled 2014-10-28: qty 1

## 2014-11-01 ENCOUNTER — Other Ambulatory Visit: Payer: Self-pay | Admitting: *Deleted

## 2014-11-01 NOTE — Addendum Note (Signed)
Addended by: Osa Craver on: 11/01/2014 12:34 PM   Modules accepted: Orders, Medications

## 2014-11-01 NOTE — Patient Outreach (Signed)
[facility]) Care Management  11/01/2014  TEXAS ZOGG 1945-04-14 [id number]   CSW made one last attempt to try and contact patient today to perform phone assessment, as well as assess and assist with social work needs and services, without success.  CSW will proceed with case closure on patient, due to inability to establish phone contact, after required number of attempts made and outreach letter mailed to patient's home.  CSW will fax a correspondence letter to patient's Primary Care Physician, Dr. Seward Carol.  CSW will notify patient's RNCM with [facility] Management, [name] of CSW's plans to close patient's case. Nat Christen, BSW, MSW, LCSW  Licensed Education officer, environmental Health System  Mailing [place] [address] [place], [place] [address] Physical Address-[address], [place], [place] [address] Toll Free Main # [phone] Fax # [phone] Cell # [phone]  Fax # [phone]  [email or web address]

## 2014-11-02 ENCOUNTER — Emergency Department (HOSPITAL_COMMUNITY): Payer: Medicare Other

## 2014-11-02 ENCOUNTER — Other Ambulatory Visit: Payer: Self-pay | Admitting: Pharmacist

## 2014-11-02 ENCOUNTER — Encounter (HOSPITAL_COMMUNITY): Payer: Self-pay | Admitting: Emergency Medicine

## 2014-11-02 ENCOUNTER — Emergency Department (HOSPITAL_COMMUNITY)
Admission: EM | Admit: 2014-11-02 | Discharge: 2014-11-02 | Disposition: A | Discharge status: Home / Self Care | Payer: Medicare Other | Attending: Emergency Medicine | Admitting: Emergency Medicine

## 2014-11-02 DIAGNOSIS — J449 Chronic obstructive pulmonary disease, unspecified: Secondary | ICD-10-CM | POA: Diagnosis not present

## 2014-11-02 DIAGNOSIS — I509 Heart failure, unspecified: Secondary | ICD-10-CM | POA: Diagnosis not present

## 2014-11-02 DIAGNOSIS — Z8673 Personal history of transient ischemic attack (TIA), and cerebral infarction without residual deficits: Secondary | ICD-10-CM | POA: Diagnosis not present

## 2014-11-02 DIAGNOSIS — E785 Hyperlipidemia, unspecified: Secondary | ICD-10-CM | POA: Insufficient documentation

## 2014-11-02 DIAGNOSIS — K2970 Gastritis, unspecified, without bleeding: Secondary | ICD-10-CM | POA: Diagnosis not present

## 2014-11-02 DIAGNOSIS — K219 Gastro-esophageal reflux disease without esophagitis: Secondary | ICD-10-CM | POA: Insufficient documentation

## 2014-11-02 DIAGNOSIS — Z9889 Other specified postprocedural states: Secondary | ICD-10-CM | POA: Insufficient documentation

## 2014-11-02 DIAGNOSIS — H9193 Unspecified hearing loss, bilateral: Secondary | ICD-10-CM | POA: Diagnosis not present

## 2014-11-02 DIAGNOSIS — G40909 Epilepsy, unspecified, not intractable, without status epilepticus: Secondary | ICD-10-CM | POA: Diagnosis not present

## 2014-11-02 DIAGNOSIS — Z872 Personal history of diseases of the skin and subcutaneous tissue: Secondary | ICD-10-CM | POA: Insufficient documentation

## 2014-11-02 DIAGNOSIS — G4733 Obstructive sleep apnea (adult) (pediatric): Secondary | ICD-10-CM | POA: Insufficient documentation

## 2014-11-02 DIAGNOSIS — I129 Hypertensive chronic kidney disease with stage 1 through stage 4 chronic kidney disease, or unspecified chronic kidney disease: Secondary | ICD-10-CM | POA: Insufficient documentation

## 2014-11-02 DIAGNOSIS — K3184 Gastroparesis: Secondary | ICD-10-CM | POA: Insufficient documentation

## 2014-11-02 DIAGNOSIS — M1990 Unspecified osteoarthritis, unspecified site: Secondary | ICD-10-CM | POA: Insufficient documentation

## 2014-11-02 DIAGNOSIS — Z86010 Personal history of colonic polyps: Secondary | ICD-10-CM | POA: Diagnosis not present

## 2014-11-02 DIAGNOSIS — N183 Chronic kidney disease, stage 3 (moderate): Secondary | ICD-10-CM | POA: Diagnosis not present

## 2014-11-02 DIAGNOSIS — Z9981 Dependence on supplemental oxygen: Secondary | ICD-10-CM | POA: Insufficient documentation

## 2014-11-02 DIAGNOSIS — R2689 Other abnormalities of gait and mobility: Secondary | ICD-10-CM | POA: Insufficient documentation

## 2014-11-02 DIAGNOSIS — I2510 Atherosclerotic heart disease of native coronary artery without angina pectoris: Secondary | ICD-10-CM | POA: Insufficient documentation

## 2014-11-02 DIAGNOSIS — D649 Anemia, unspecified: Secondary | ICD-10-CM | POA: Diagnosis not present

## 2014-11-02 DIAGNOSIS — R51 Headache: Secondary | ICD-10-CM | POA: Diagnosis not present

## 2014-11-02 DIAGNOSIS — I10 Essential (primary) hypertension: Secondary | ICD-10-CM | POA: Diagnosis not present

## 2014-11-02 DIAGNOSIS — M810 Age-related osteoporosis without current pathological fracture: Secondary | ICD-10-CM | POA: Insufficient documentation

## 2014-11-02 DIAGNOSIS — Z79899 Other long term (current) drug therapy: Secondary | ICD-10-CM | POA: Insufficient documentation

## 2014-11-02 DIAGNOSIS — Z8659 Personal history of other mental and behavioral disorders: Secondary | ICD-10-CM | POA: Diagnosis not present

## 2014-11-02 DIAGNOSIS — K222 Esophageal obstruction: Secondary | ICD-10-CM | POA: Insufficient documentation

## 2014-11-02 DIAGNOSIS — Z7982 Long term (current) use of aspirin: Secondary | ICD-10-CM | POA: Diagnosis not present

## 2014-11-02 DIAGNOSIS — Z7951 Long term (current) use of inhaled steroids: Secondary | ICD-10-CM | POA: Diagnosis not present

## 2014-11-02 DIAGNOSIS — E119 Type 2 diabetes mellitus without complications: Secondary | ICD-10-CM | POA: Insufficient documentation

## 2014-11-02 DIAGNOSIS — Z87891 Personal history of nicotine dependence: Secondary | ICD-10-CM | POA: Diagnosis not present

## 2014-11-02 LAB — CBG MONITORING, ED: Glucose-Capillary: 114 mg/dL — ABNORMAL HIGH (ref 65–99)

## 2014-11-02 MED ORDER — HYDRALAZINE HCL 50 MG PO TABS
100.0000 mg | ORAL_TABLET | Freq: Once | ORAL | Status: AC
  Administered 2014-11-02: 100 mg via ORAL
  Filled 2014-11-02: qty 2

## 2014-11-02 MED ORDER — ACETAMINOPHEN 500 MG PO TABS
1000.0000 mg | ORAL_TABLET | Freq: Once | ORAL | Status: AC
  Administered 2014-11-02: 1000 mg via ORAL
  Filled 2014-11-02: qty 2

## 2014-11-02 NOTE — Discharge Instructions (Signed)
DASH Eating Plan  DASH stands for "Dietary Approaches to Stop Hypertension." The DASH eating plan is a healthy eating plan that has been shown to reduce high blood pressure (hypertension). Additional health benefits may include reducing the risk of type 2 diabetes mellitus, heart disease, and stroke. The DASH eating plan may also help with weight loss.  WHAT DO I NEED TO KNOW ABOUT THE DASH EATING PLAN?  For the DASH eating plan, you will follow these general guidelines:  · Choose foods with a percent daily value for sodium of less than 5% (as listed on the food label).  · Use salt-free seasonings or herbs instead of table salt or sea salt.  · Check with your health care provider or pharmacist before using salt substitutes.  · Eat lower-sodium products, often labeled as "lower sodium" or "no salt added."  · Eat fresh foods.  · Eat more vegetables, fruits, and low-fat dairy products.  · Choose whole grains. Look for the word "whole" as the first word in the ingredient list.  · Choose fish and skinless chicken or turkey more often than red meat. Limit fish, poultry, and meat to 6 oz (170 g) each day.  · Limit sweets, desserts, sugars, and sugary drinks.  · Choose heart-healthy fats.  · Limit cheese to 1 oz (28 g) per day.  · Eat more home-cooked food and less restaurant, buffet, and fast food.  · Limit fried foods.  · Cook foods using methods other than frying.  · Limit canned vegetables. If you do use them, rinse them well to decrease the sodium.  · When eating at a restaurant, ask that your food be prepared with less salt, or no salt if possible.  WHAT FOODS CAN I EAT?  Seek help from a dietitian for individual calorie needs.  Grains  Whole grain or whole wheat bread. Brown rice. Whole grain or whole wheat pasta. Quinoa, bulgur, and whole grain cereals. Low-sodium cereals. Corn or whole wheat flour tortillas. Whole grain cornbread. Whole grain crackers. Low-sodium crackers.  Vegetables  Fresh or frozen vegetables  (raw, steamed, roasted, or grilled). Low-sodium or reduced-sodium tomato and vegetable juices. Low-sodium or reduced-sodium tomato sauce and paste. Low-sodium or reduced-sodium canned vegetables.   Fruits  All fresh, canned (in natural juice), or frozen fruits.  Meat and Other Protein Products  Ground beef (85% or leaner), grass-fed beef, or beef trimmed of fat. Skinless chicken or turkey. Ground chicken or turkey. Pork trimmed of fat. All fish and seafood. Eggs. Dried beans, peas, or lentils. Unsalted nuts and seeds. Unsalted canned beans.  Dairy  Low-fat dairy products, such as skim or 1% milk, 2% or reduced-fat cheeses, low-fat ricotta or cottage cheese, or plain low-fat yogurt. Low-sodium or reduced-sodium cheeses.  Fats and Oils  Tub margarines without trans fats. Light or reduced-fat mayonnaise and salad dressings (reduced sodium). Avocado. Safflower, olive, or canola oils. Natural peanut or almond butter.  Other  Unsalted popcorn and pretzels.  The items listed above may not be a complete list of recommended foods or beverages. Contact your dietitian for more options.  WHAT FOODS ARE NOT RECOMMENDED?  Grains  White bread. White pasta. White rice. Refined cornbread. Bagels and croissants. Crackers that contain trans fat.  Vegetables  Creamed or fried vegetables. Vegetables in a cheese sauce. Regular canned vegetables. Regular canned tomato sauce and paste. Regular tomato and vegetable juices.  Fruits  Dried fruits. Canned fruit in light or heavy syrup. Fruit juice.  Meat and Other Protein   Products  Fatty cuts of meat. Ribs, chicken wings, bacon, sausage, bologna, salami, chitterlings, fatback, hot dogs, bratwurst, and packaged luncheon meats. Salted nuts and seeds. Canned beans with salt.  Dairy  Whole or 2% milk, cream, half-and-half, and cream cheese. Whole-fat or sweetened yogurt. Full-fat cheeses or blue cheese. Nondairy creamers and whipped toppings. Processed cheese, cheese spreads, or cheese  curds.  Condiments  Onion and garlic salt, seasoned salt, table salt, and sea salt. Canned and packaged gravies. Worcestershire sauce. Tartar sauce. Barbecue sauce. Teriyaki sauce. Soy sauce, including reduced sodium. Steak sauce. Fish sauce. Oyster sauce. Cocktail sauce. Horseradish. Ketchup and mustard. Meat flavorings and tenderizers. Bouillon cubes. Hot sauce. Tabasco sauce. Marinades. Taco seasonings. Relishes.  Fats and Oils  Butter, stick margarine, lard, shortening, ghee, and bacon fat. Coconut, palm kernel, or palm oils. Regular salad dressings.  Other  Pickles and olives. Salted popcorn and pretzels.  The items listed above may not be a complete list of foods and beverages to avoid. Contact your dietitian for more information.  WHERE CAN I FIND MORE INFORMATION?  National Heart, Lung, and Blood Institute: www.nhlbi.nih.gov/health/health-topics/topics/dash/     This information is not intended to replace advice given to you by your health care provider. Make sure you discuss any questions you have with your health care provider.     Document Released: 12/27/2010 Document Revised: 01/28/2014 Document Reviewed: 11/11/2012  Elsevier Interactive Patient Education ©2016 Elsevier Inc.

## 2014-11-02 NOTE — ED Provider Notes (Signed)
CSN: [id number]     Arrival date & time 11/02/14  1312 History   First MD Initiated Contact with Patient 11/02/14 1528     Chief Complaint  Patient presents with  . Hypertension  . Headache    The history is provided by the patient, a caregiver and medical records.     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   HA- onset Sunday (3 days ago), gradually, no particular inciting event that pt can recall. Gradually worsening since then. Now moderate severity at worst (does not report 10/10 as previously stated by home health service). Located in the occipital area. Nonradiating pain. Similar to prior headaches. Patient states that normally when she has headaches like this, she takes an aspirin and they go away, but today she was told not to take an aspirin at home. Denies hx trauma. No associated confusion, neck pain/stiffness, fevers, vision changes, numbness/paresthesias, weakness.  Hypertension- onset today. Context- pt having home health services to aid with medications and compliance. Pressure at home taken and found to be 200s / 90-100s. Per pt and her caretakers report she has not had her mid day hydralazine- different history than stated by home health. Denies associated CP, SOB, vision changes/neuro deficits, n/v.    Past Medical History  Diagnosis Date  . Allergy   . Depression   . Diabetes mellitus 1997    Type II   . GERD (gastroesophageal reflux disease)   . Hypertension   . Hyperlipidemia   . Osteoarthritis   . Osteoporosis   . Hair loss   . Anemia   . CAD (coronary artery disease)     Mild very minimal coronary disease with 20% obtuse marginal stenosis  . CVA (cerebral infarction)   . Diverticulosis   . Esophageal stricture   . Gastritis   . Adenomatous colon polyp   . Sickle cell trait ([place])   . Asthma        . COPD  (chronic obstructive pulmonary disease) ([place])   . Secondary pulmonary hypertension ([place]) 03/07/2009  . Gastroparesis 08/21/2007  . RENAL INSUFFICIENCY 02/16/2009  . OSTEOARTHRITIS 08/09/2006  . Morbid obesity ([place])   . CHF (congestive heart failure) ([place])   . Carpal tunnel syndrome on left   . 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 Hernia, hiatal   . Seizures ([place])     pt thinks it has been several monthes since she had a seisure  . Stroke ([place])   . Hearing loss of both ears   . 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 Chronic kidney disease (CKD), stage III (moderate)    Past Surgical History  Procedure Laterality Date  . Abdominal hysterectomy    . Replacement total knee Left 1998  . Esophagogastroduodenoscopy  06/25/2006  . Erd  08/08/2000  . Artery biopsy  01/07/2011    Procedure: MINOR BIOPSY TEMPORAL ARTERY;  Surgeon: Haywood Lasso, MD;  Location: [facility];  Service: General;  Laterality: Left;  left temporal artery biopsy  . Breast lumpectomy      benign  . Breast lumpectomy      both breast lumps removed   . Hernia repair    . Colonoscopy    . Upper gastrointestinal endoscopy    . Bil foot surgery    . Left and right heart catheterization with coronary angiogram N/A  03/03/2013    Procedure: LEFT AND RIGHT HEART CATHETERIZATION WITH CORONARY ANGIOGRAM;  Surgeon: Minus Breeding, MD;  Location: [facility] CATH LAB;  Service: Cardiovascular;  Laterality: N/A;   Family History  Problem Relation Age of Onset  . Colon cancer Brother   . Cancer Brother     Colon Cancer  . Liver cancer Mother     Liver Cancer  . Diabetes Mother   . Kidney disease Mother   . Heart disease Mother     age 31's  . Heart disease Father 65    MI  . Heart attack Father     died of MI when pt was 38  . Sickle cell anemia Father   . Diabetes Sister   . Kidney disease Sister   . Heart disease Sister     age 1's  . Allergies Sister   . Diabetes Sister   . Kidney  disease Sister   . Heart disease Sister     age 36's  . Esophageal cancer Neg Hx   . Rectal cancer Neg Hx   . Stomach cancer Neg Hx    Social History  Substance Use Topics  . Smoking status: Former Smoker -- 0.50 packs/day for 10 years    Quit date: 04/18/1980  . Smokeless tobacco: Never Used  . Alcohol Use: No   OB History    No data available     Review of Systems  Constitutional: Negative for fever.  HENT: Negative for rhinorrhea.   Eyes: Negative for visual disturbance.  Respiratory: Negative for shortness of breath.   Cardiovascular: Negative for chest pain.  Gastrointestinal: Negative for vomiting and abdominal pain.  Genitourinary: Negative for decreased urine volume.  Skin: Negative for rash.  Allergic/Immunologic: Negative for immunocompromised state.  Neurological: Positive for headaches. Negative for syncope, facial asymmetry, weakness and numbness.  Psychiatric/Behavioral: Negative for confusion.    Allergies  Promethazine hcl  Home Medications   Prior to Admission medications   Medication Sig Start Date End Date Taking? Authorizing Provider  albuterol (VENTOLIN HFA) 108 (90 BASE) MCG/ACT inhaler Inhale 2 puffs into the lungs every 6 (six) hours as needed for shortness of breath. 07/07/12  Yes [name], MD  aspirin 325 MG tablet Take 325 mg by mouth daily.    Yes Historical Provider, MD  atorvastatin (LIPITOR) 40 MG tablet Take 1 tablet (40 mg total) by mouth daily. Patient taking differently: Take 40 mg by mouth at bedtime.  09/07/10  Yes Minus Breeding, MD  Calcium Carbonate-Vit D-Min (CALTRATE PLUS PO) Take 600 mg by mouth every morning.    Yes Historical Provider, MD  [facility] Liver Oil CAPS Take 1 capsule by mouth daily.   Yes Historical Provider, MD  cyclobenzaprine (FLEXERIL) 10 MG tablet Take 10 mg by mouth at bedtime.    Yes Historical Provider, MD  dextromethorphan-guaiFENesin (ROBITUSSIN-DM) 10-100 MG/5ML liquid Take 10 mLs by mouth every 4 (four)  hours as needed for cough.   Yes Historical Provider, MD  donepezil (ARICEPT) 10 MG tablet Take 10 mg by mouth at bedtime.    Yes Historical Provider, MD  doxazosin (CARDURA) 2 MG tablet Take 1 tablet by mouth at bedtime. 10/10/14  Yes Historical Provider, MD  ezetimibe (ZETIA) 10 MG tablet Take 10 mg by mouth at bedtime.    Yes Historical Provider, MD  ferrous sulfate 325 (65 FE) MG EC tablet Take 325 mg by mouth every morning.   Yes Historical Provider, MD  fluticasone (FLONASE) 50 MCG/ACT nasal  spray Place 2 sprays into the nose daily. 01/16/12 11/02/14 Yes [name], MD  furosemide (LASIX) 80 MG tablet Take 1 tablet by mouth 2 (two) times daily. 07/12/14  Yes Historical Provider, MD  gabapentin (NEURONTIN) 300 MG capsule Take 1 capsule (300 mg total) by mouth 2 (two) times daily. 04/29/11  Yes Renato Shin, MD  hydrALAZINE (APRESOLINE) 100 MG tablet Take 100 mg by mouth 3 (three) times daily.   Yes Historical Provider, MD  insulin glargine (LANTUS) 100 UNIT/ML injection Inject 0.15 mLs (15 Units total) into the skin daily. Patient taking differently: Inject 30 Units into the skin daily.  09/18/13  Yes Maryann Mikhail, DO  insulin NPH (HUMULIN N) 100 UNIT/ML injection Inject 2-13 Units into the skin 3 (three) times daily after meals. Sliding scale with each meal 07/07/12  Yes Renato Shin, MD  isosorbide mononitrate (IMDUR) 120 MG 24 hr tablet TAKE ONE (1) TABLET BY MOUTH EVERY DAY 10/29/13  Yes Minus Breeding, MD  KLOR-CON M20 20 MEQ tablet Take 1 tablet by mouth daily.  06/19/11  Yes Historical Provider, MD  LIDODERM 5 % Apply patch to area for 12 hours as needed for pain 10/20/11  Yes Historical Provider, MD  losartan (COZAAR) 100 MG tablet Take 1 tablet by mouth every morning.  09/12/14  Yes Historical Provider, MD  metoCLOPramide (REGLAN) 10 MG tablet Take 10 mg by mouth 4 (four) times daily.   Yes Historical Provider, MD  metolazone (ZAROXOLYN) 2.5 MG tablet Take 1 tablet by mouth on Monday and  Friday 10/10/14  Yes Historical Provider, MD  montelukast (SINGULAIR) 10 MG tablet Take 1 tablet (10 mg total) by mouth daily. Patient taking differently: Take 10 mg by mouth at bedtime.  07/07/12  Yes [name], MD  Multiple Vitamins-Minerals (MULTIVITAMIN GUMMIES ADULT) CHEW Chew 1 capsule by mouth daily.   Yes Historical Provider, MD  nitroGLYCERIN (NITROSTAT) 0.4 MG SL tablet Place 1 tablet (0.4 mg total) under the tongue every 5 (five) minutes as needed for chest pain. 04/12/14  Yes Minus Breeding, MD  omeprazole (PRILOSEC) 20 MG capsule Take 20 mg by mouth 2 (two) times daily before a meal.  08/31/12  Yes Inda Castle, MD  OXYGEN Inhale into the lungs. CPAP with oxygen at bedtime   Yes Historical Provider, MD  raloxifene (EVISTA) 60 MG tablet Take 60 mg by mouth every morning.    Yes Historical Provider, MD  sucralfate (CARAFATE) 1 GM/10ML suspension Take 10 mLs (1 g total) by mouth 2 (two) times daily. 07/07/12  Yes Inda Castle, MD  SYMBICORT 160-4.5 MCG/ACT inhaler INHALE 2 PUFFS INTO LUNGS 2 TIMES DAILY 09/13/14  Yes Tammy S Parrett, NP  traMADol (ULTRAM) 50 MG tablet Take 50 mg by mouth every 6 (six) hours as needed.   Yes Historical Provider, MD  Vitamin D, Ergocalciferol, (DRISDOL) 50000 UNITS CAPS capsule Take 50,000 Units by mouth every Monday.    Yes Historical Provider, MD   BP 158/71 mmHg  Pulse 81  Temp(Src) 98.6 F (37 C) (Oral)  Resp 20  Ht 5\' 4"  (1.626 m)  Wt 282 lb (127.914 kg)  BMI 48.38 kg/m2  SpO2 97% Physical Exam  Constitutional: She is oriented to person, place, and time. She appears well-developed and well-nourished.  Morbidly obese, NAD  HENT:  Head: Normocephalic and atraumatic.  Eyes: Pupils are equal, round, and reactive to light. Right eye exhibits no discharge. Left eye exhibits no discharge.  Neck: Normal range of motion. Neck supple.  No tracheal deviation present.  Cardiovascular: Normal rate, regular rhythm and intact distal pulses.    Pulmonary/Chest: Effort normal. No respiratory distress. She has wheezes.  Abdominal: Soft. She exhibits no distension. There is no tenderness.  Musculoskeletal: She exhibits edema (bilat nonpitting LE edema ).  Neurological: She is alert and oriented to person, place, and time. She has normal strength and normal reflexes. No cranial nerve deficit or sensory deficit. She exhibits normal muscle tone. Gait (pt uses cane to walk; able to ambulate about room with cane and states this is her baseline- caregiver in room in agreement) abnormal. Coordination normal. GCS eye subscore is 4. GCS verbal subscore is 5. GCS motor subscore is 6.  Skin: Skin is warm and dry.  Psychiatric: She has a normal mood and affect. Her behavior is normal.  Nursing note and vitals reviewed.   ED Course  Procedures (including critical care time) Labs Review Labs Reviewed  CBG MONITORING, ED - Abnormal; Notable for the following:    Glucose-Capillary 114 (*)    All other components within normal limits    Imaging Review Ct Head Wo Contrast  11/02/2014  CLINICAL DATA:  69 year old female with headaches for 3 days, hypertension. Initial encounter. EXAM: CT HEAD WITHOUT CONTRAST TECHNIQUE: Contiguous axial images were obtained from the base of the skull through the vertex without intravenous contrast. COMPARISON:  09/12/2013 head CT.  Brain MRI 07/03/2009. FINDINGS: Visualized paranasal sinuses and mastoids are clear. Negative orbit and scalp soft tissues. No acute osseous abnormality identified. Calcified atherosclerosis at the skull base. Cerebral volume has mildly diminished, remains within normal limits for age. No ventriculomegaly. No midline shift, mass effect, or evidence of intracranial mass lesion. Chronic left cerebellar lacunar infarct is unchanged since 2011. No suspicious intracranial vascular hyperdensity. Patchy cerebral white matter hypodensity has not significantly changed. No cortically based acute infarct  identified. No acute intracranial hemorrhage identified. IMPRESSION: 1.  No acute intracranial abnormality. 2. Chronic small vessel disease, stable since 2015. Electronically Signed   By: Genevie [name] M.D.   On: 11/02/2014 18:45   I have personally reviewed and evaluated these images and lab results as part of my medical decision-making.   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  Neuro intact. Blood pressure while in ED 160s / 70s similar to prior presentations. Given home hydralazine. Given recent uncontrolled HTN and several days of HA obtained CT head which was negative for acute bleed or other intracranial abnormality to explain HA. HA improved with NSAID and patient remained neurologically intact while in ED, states she feels completely at baseline. Dc in stable condition with f/u with primary care in 1-2 days regarding better control of HTN longer term. Do not feel pt requires further lowering acutely given no evidence of end organ damage and risk of relative ischemia.   Case discussed with Dr. Oleta Mouse, who oversaw management of this patient.     Ivin Booty, MD 11/03/14 0010  Forde Dandy, MD 11/03/14 [id number]

## 2014-11-02 NOTE — Patient Outreach (Signed)
[facility]) Care Management  [place]   11/02/2014  Joann Gutierrez July 04, 1945 [id number]  Subjective: Joann Gutierrez is a 69yo female referred to [place] for medication review and education.  I made initial home visit along with pharmacy student, Alta Corning.  Patient's caregiver Joann Gutierrez was also present during visit.  Patient had c/o headache and reported a score of 10 out of 10 on pain scale.    I reviewed all of patient's medications.  Patient had all prescription medications available.  She does not currently use a pill box for her medications.  I provided patient with a pill box and taught her care giver Joann Gutierrez how to fill pill box.    I also discussed inhaler technique with patient for her Symbicort inhaler.  She stated she is sometimes using Symbicort twice daily.  I counseled patient on difference between maintenance inhaler (Symbicort) that should be used every day as prescription and rescue inhaler (albuterol) that should be used as needed for shortness of breath.  Patient had not used Symbicort inhaler yet today.  I had patient demonstrate how she uses inhaler and coached her on proper use.    Patient had multiple over the counter cold and cough products at her home including: acetaminophen/phenylephrine tablets, acetaminophen/dextromethorpham/phenylephrine tablets, dextromethorphan/guaifenesin syrup, and guaifenesin tablets.  Patient reports she had not taken any of these medications today.  I counseled patient that many of the medications had the same active ingredients.  I counseled patient on how each medication works and when to use each medication and encouraged her not to take them all at the same time.    Noted duplicate prescriptions for basal insulin on patient's chart and patient had both Lantus and NPH available at her home.  Patient states she takes Lantus 30 units daily, and takes NPH 5 units or less as a sliding scale but has only  been taking the NPH once or twice weekly.  Patient's 30-day average BG was 224mg /dL, and she denies any recent hypoglycemia.  I counseled patient that both Lantus and NPH are long acting insulin and work in the same way.    Patient states she takes aspirin 81mg  daily and takes aspirin 325mg  PRN for pain.  Discussed that acetaminophen could be used as an alternative to aspirin 325mg  for pain.    Objective:  BP: 199/110 on right arm, recheck: 195/102 on left arm   Current Medications: Current Outpatient Prescriptions  Medication Sig Dispense Refill  . albuterol (VENTOLIN HFA) 108 (90 BASE) MCG/ACT inhaler Inhale 2 puffs into the lungs every 6 (six) hours as needed for shortness of breath. (Patient taking differently: Inhale 2 puffs into the lungs every 4 (four) hours as needed for wheezing or shortness of breath. ) 1 Inhaler 6  . aspirin 325 MG tablet Take 325 mg by mouth as needed.     Marland Kitchen aspirin 81 MG tablet Take 81 mg by mouth daily.    Marland Kitchen atorvastatin (LIPITOR) 40 MG tablet Take 1 tablet (40 mg total) by mouth daily. (Patient taking differently: Take 40 mg by mouth at bedtime. ) 30 tablet 6  . Calcium Carbonate-Vit D-Min (CALTRATE PLUS PO) Take 600 mg by mouth every morning.     . donepezil (ARICEPT) 10 MG tablet Take 10 mg by mouth every morning.     Marland Kitchen doxazosin (CARDURA) 2 MG tablet Take 1 tablet by mouth at bedtime.    Marland Kitchen ezetimibe (ZETIA) 10 MG tablet Take 10 mg by  mouth every morning.     . ferrous sulfate 325 (65 FE) MG EC tablet Take 325 mg by mouth every morning.    . furosemide (LASIX) 80 MG tablet Take 1 tablet by mouth 2 (two) times daily.    Marland Kitchen gabapentin (NEURONTIN) 300 MG capsule Take 1 capsule (300 mg total) by mouth 2 (two) times daily. 60 capsule 5  . hydrALAZINE (APRESOLINE) 100 MG tablet Take 100 mg by mouth 3 (three) times daily.    . insulin glargine (LANTUS) 100 UNIT/ML injection Inject 0.15 mLs (15 Units total) into the skin daily. (Patient taking differently: Inject 30  Units into the skin daily. ) 10 mL 11  . insulin NPH (HUMULIN N) 100 UNIT/ML injection Inject 2-13 Units into the skin 3 (three) times daily after meals. Sliding scale with each meal    . isosorbide mononitrate (IMDUR) 120 MG 24 hr tablet TAKE ONE (1) TABLET BY MOUTH EVERY DAY (Patient taking differently: TAKE ONE (1) TABLET BY MOUTH EVERY MORNING) 30 tablet 11  . KLOR-CON M20 20 MEQ tablet Take 1 tablet by mouth 2 (two) times daily.     Marland Kitchen LIDODERM 5 % Apply patch to area for 12 hours as needed for pain    . losartan (COZAAR) 100 MG tablet Take 1 tablet by mouth every morning.     . metolazone (ZAROXOLYN) 2.5 MG tablet Take 1 tablet by mouth on Monday and Friday    . montelukast (SINGULAIR) 10 MG tablet Take 1 tablet (10 mg total) by mouth daily. (Patient taking differently: Take 10 mg by mouth at bedtime. ) 30 tablet 6  . nitroGLYCERIN (NITROSTAT) 0.4 MG SL tablet Place 1 tablet (0.4 mg total) under the tongue every 5 (five) minutes as needed for chest pain. 25 tablet 12  . omeprazole (PRILOSEC) 20 MG capsule Take 20 mg by mouth 2 (two) times daily before a meal.     . OXYGEN Inhale into the lungs. CPAP with oxygen at bedtime    . raloxifene (EVISTA) 60 MG tablet Take 60 mg by mouth every morning.     Marland Kitchen Respiratory Therapy Supplies (FLUTTER) DEVI Use as directed 1 each 0  . sucralfate (CARAFATE) 1 GM/10ML suspension Take 10 mLs (1 g total) by mouth 2 (two) times daily. 1260 mL 3  . SYMBICORT 160-4.5 MCG/ACT inhaler INHALE 2 PUFFS INTO LUNGS 2 TIMES DAILY 10.2 g 5  . Vitamin D, Ergocalciferol, (DRISDOL) 50000 UNITS CAPS capsule Take 50,000 Units by mouth every 7 (seven) days.    . fluticasone (FLONASE) 50 MCG/ACT nasal spray Place 2 sprays into the nose daily. (Patient taking differently: Place 2 sprays into the nose every morning. ) 16 g 5   No current facility-administered medications for this visit.   Functional Status: In your present state of health, do you have any difficulty performing  the following activities: 10/21/2014 10/03/2014  Hearing? N N  Vision? N Y  Difficulty concentrating or making decisions? N Y  Walking or climbing stairs? Y Y  Dressing or bathing? N Y  Doing errands, shopping? - Y  Preparing Food and eating ? - Y  Using the Toilet? - N  In the past six months, have you accidently leaked urine? - N  Do you have problems with loss of bowel control? - N  Managing your Medications? - Y  Managing your Finances? - Y  Housekeeping or managing your Housekeeping? - Y   Fall/Depression Screening: [facility] 2/9 Scores 10/03/2014 04/07/2013  PHQ -  2 Score 6 2  PHQ- 9 Score 12 14  Exception Documentation Other- indicate reason in comment box -   Assessment: 1.  Blood pressure: Patient's blood pressure was initially 199/110 with a recheck of 195/102.  Patient reports she took her blood pressure medications this morning (losartan and isosorbide mononitrate at 6:30 AM, hydralazine at 8AM).  I called patient's primary care provider, [name] in regard to patient's blood pressure and symptoms of headache.  I spoke to Dr. Lina Sar nurse Carlyon Shadow.  [name] recommended that patient go to the ER.  Patient expressed concern that she did not have transportation to get to the ER.  Patient's caregiver called patient's sister to arrange a ride to the ER.  Patient said she was due to take her mid-day hydralazine dose, and she took dose shortly before leaving for the ER.  I waited at patient's home until her sister arrived.    2.  Medication management:  Patient had all prescribed medications.  Patient and caregiver expressed interest in using a pill box to organize the patient's medications.  I provided patient with a pill box and taught patient's caregiver Joann Gutierrez how to fill pill box.  3.  Medication review:   Drugs sorted by system:  Neurologic/Psychologic: donepezil  Cardiovascular: aspirin 81mg , atorvastatin, doxazosin, ezetimibe, furosemide, hydralazine, isosorbide mononitrate,  losartan, metolazone, nitroglycerin SL, potassium  Pulmonary/Allergy: albuterol HFA, budesonide/formoterol, fluticasone nasal spray, montelukast  Gastrointestinal: metoclopramide, omeprazole, sucralafate  Endocrine: insulin glargine, insulin NPH  Renal: none noted   Topical: lidocaine patch  Pain: cyclobenzaprine, gabapentin, lidocaine patch, tramadol  Vitamins/Minerals: calcium carbonate/vitamin D, ferrous sulfate, ergocalciferol 50,000 units weekly, MVI, cod liver oil  Infectious Diseases:  Miscellaneous: raloxifene  Duplications in therapy:  Patient has duplicate prescriptions for basal insulin - she takes Lantus 30 units daily and NPH on sliding scale.  Gaps in therapy: none noted Medications to avoid in the elderly: doxazosin (high risk of orthostatic hypotension), metoclopramide (can cause extrapyramidal effects), omeprazole (risk of C. Diff infection and bone loss and fracture), cyclobenzaprine (skeletal muscle relaxants: anticholinergic effects, sedation, increased risk of fractures)  Drug interactions: none noted     Plan: 1.  Patient will go to the ER per recommendation from [name].   2.  Patient will use pill box to organize medications with the help of her caregiver, Joann Gutierrez.   3.  Patient will take all medications as prescribed.   4.  Will alert patient's PCP about duplicate prescriptions for Lantus and NPH and recommend discontinuation of NPH sliding scale.   5.  Will plan to follow up within one week. 6.  Alerted [facility] CMRN, Loni Muse, regarding patient's trip to ER.      [name], Pharm.D. Pharmacy Resident [place] [phone]

## 2014-11-02 NOTE — ED Notes (Signed)
Onset today home health nurse took blood pressure 199/102 spoke with primary Doctor sent to ED for evaluation states headache 10/10 throbbing. Alert answering and following commands appropriate.

## 2014-11-03 ENCOUNTER — Other Ambulatory Visit: Payer: Self-pay | Admitting: Pharmacist

## 2014-11-03 NOTE — Patient Outreach (Signed)
[facility]) Care Management  11/03/2014  Joann Gutierrez 03-20-1945 [id number]   Joann Gutierrez is a 69yo female referred to [place] for medication review and education.  I called patient to follow up after my visit yesterday.  Patient reports she is doing well today and has no complaints.  Patient reports she does not have any questions about the pill box that was set up for her yesterday by her caregiver Joann Gutierrez.  She plans to start taking her medications using the pill box.  I will follow up with patient on Wednesday, October 19th.     Joann Gutierrez, Pharm.D. Pharmacy Resident [place] [phone]

## 2014-11-04 ENCOUNTER — Encounter (HOSPITAL_COMMUNITY)
Admission: RE | Admit: 2014-11-04 | Discharge: 2014-11-04 | Disposition: A | Discharge status: Home / Self Care | Payer: Medicare Other | Source: Ambulatory Visit | Attending: Nephrology | Admitting: Nephrology

## 2014-11-04 ENCOUNTER — Other Ambulatory Visit: Payer: Self-pay | Admitting: *Deleted

## 2014-11-04 ENCOUNTER — Telehealth: Payer: Self-pay | Admitting: Gastroenterology

## 2014-11-04 ENCOUNTER — Other Ambulatory Visit: Payer: Self-pay | Admitting: Pharmacist

## 2014-11-04 ENCOUNTER — Other Ambulatory Visit: Payer: Self-pay

## 2014-11-04 DIAGNOSIS — D631 Anemia in chronic kidney disease: Secondary | ICD-10-CM | POA: Diagnosis not present

## 2014-11-04 DIAGNOSIS — N183 Chronic kidney disease, stage 3 (moderate): Secondary | ICD-10-CM | POA: Diagnosis not present

## 2014-11-04 LAB — POCT HEMOGLOBIN-HEMACUE: HEMOGLOBIN: 10.7 g/dL — AB (ref 12.0–15.0)

## 2014-11-04 MED ORDER — EPOETIN ALFA 20000 UNIT/ML IJ SOLN
20000.0000 [IU] | INTRAMUSCULAR | Status: DC
  Administered 2014-11-04: 20000 [IU] via SUBCUTANEOUS

## 2014-11-04 MED ORDER — OMEPRAZOLE 40 MG PO CPDR: DELAYED_RELEASE_CAPSULE | ORAL | Status: DC

## 2014-11-04 MED ORDER — EPOETIN ALFA 20000 UNIT/ML IJ SOLN
INTRAMUSCULAR | Status: AC
  Filled 2014-11-04: qty 1

## 2014-11-04 NOTE — Telephone Encounter (Signed)
Patient's insurance will not cover Omeprazole 40 mg BID. They will only cover daily. Please, advise.

## 2014-11-04 NOTE — Patient Outreach (Signed)
Unsuccessful attempt made to contact patient via telephone on telephone # [phone]. HIPPA compliant message left on voice mail, will attempt to reach patient on Monday, October 17

## 2014-11-04 NOTE — Patient Outreach (Signed)
[facility]) Care Management  11/04/2014  KEATYN AUJLA [birth year] [id number]   Maron Entwisle is a 69yo female referred to [place] for medication review and education. I called patient to schedule my follow up visit for next week.  Patient's caregiver [name] answered and stated the patient was asleep.     [name] noted that the patient is doing better.  He said she is using the pill box and he thinks she is much more compliant with medications by using the pill box.  I noted that patient went for her infusion today and her blood pressure was 134/50.  I am concerned for hypotension now that the patient is compliant with all medications.  I encouraged [name] to monitor her blood pressure this weekend and call the 24-hour nursing line if blood pressure drops and she is symptomatic.  I provided [name] with the phone number to the 24-hour nursing line.  [name] reports understanding.  [name] reports that patient has a follow up appointment with [name] next week on 10/17 or 10/18.  I scheduled home visit for next Wednesday, 11/09/14 at 10:00 AM.     [name], Pharm.D. Pharmacy Resident [place] [phone]

## 2014-11-05 NOTE — Telephone Encounter (Signed)
Then let's at least increase to 40 mg but just do once daily for now.  Thank you,  Jess

## 2014-11-07 ENCOUNTER — Other Ambulatory Visit: Payer: Self-pay | Admitting: Internal Medicine

## 2014-11-07 ENCOUNTER — Other Ambulatory Visit: Payer: Self-pay

## 2014-11-07 ENCOUNTER — Other Ambulatory Visit: Payer: Self-pay | Admitting: Cardiology

## 2014-11-07 MED ORDER — OMEPRAZOLE 40 MG PO CPDR: DELAYED_RELEASE_CAPSULE | ORAL | Status: DC

## 2014-11-07 NOTE — Telephone Encounter (Signed)
Rx request sent to pharmacy.  

## 2014-11-07 NOTE — Telephone Encounter (Signed)
New rx sent to pharmacy

## 2014-11-09 ENCOUNTER — Encounter: Payer: Self-pay | Admitting: Pharmacist

## 2014-11-09 ENCOUNTER — Other Ambulatory Visit: Payer: Self-pay

## 2014-11-09 ENCOUNTER — Other Ambulatory Visit: Payer: Self-pay | Admitting: Pharmacist

## 2014-11-10 ENCOUNTER — Other Ambulatory Visit: Payer: Self-pay | Admitting: Pulmonary Disease

## 2014-11-10 MED ORDER — MONTELUKAST SODIUM 10 MG PO TABS: 10.0000 mg | ORAL_TABLET | Freq: Every day | ORAL | Status: DC

## 2014-11-10 MED ORDER — FLUTICASONE PROPIONATE 50 MCG/ACT NA SUSP: 2.0000 | Freq: Every day | NASAL | Status: DC

## 2014-11-10 MED ORDER — ALBUTEROL SULFATE HFA 108 (90 BASE) MCG/ACT IN AERS: 2.0000 | INHALATION_SPRAY | Freq: Four times a day (QID) | RESPIRATORY_TRACT | Status: DC | PRN

## 2014-11-10 NOTE — Patient Outreach (Signed)
[facility]) Care Management  [place]   11/10/2014  GINNA SOSCIA 09-03-1945 [id number]  Subjective: Joann Gutierrez is a 69yo female referred to [place] for medication review and education.  I made joint home visit today with [facility] CMRN, Loni Muse.  Patient's caregiver [name] was also present for visit.  Patient reports she is doing well.  Patient complained of swelling in her legs.  Assessed by nurse during co-visit.  Please see nurses note for additional information.    I reviewed all of patient's medications.  Patient had all prescription medications available.  Patient has used pill box to take medications since my visit on 11/02/14, and has not missed any doses in the last week.  Patient's caregiver [name] filled pillbox while I was at the home.  He used a medication list to help him in ensuring all medications were included in pill box.  [name] filled pillbox, but required coaching at times to ensure box was filled correctly.  All medications were placed in the pill box except tramadol and cyclobenzaprine per patient preference.    I discussed the possibility of using [facility]) services to have a nurse help [name] fill patient's pill box.  [name] reports they are not interested in using [facility] at this time.  [name] states if he needs assistance in filling patient's pill box he will ask for assistance from patient's step-daughter or nephew who both work in healthcare.  [name] states he feels comfortable filling patient's pill box on his own next week.    [name] previously reported that patient had an appointment with [name] scheduled for 10/17 or 10/18.  Patient denies having a visit with [name] in the past two days, and reports she is scheduled to see [name] on 11/30/14.    [name] has been monitoring patient's blood pressure since my last visit.  Blood pressures have been averaging 140-160/70-80s.    Objective:   Blood pressure: 144/72  Current Medications: Current Outpatient Prescriptions  Medication Sig Dispense Refill  . albuterol (VENTOLIN HFA) 108 (90 BASE) MCG/ACT inhaler Inhale 2 puffs into the lungs every 6 (six) hours as needed for shortness of breath. 1 Inhaler 6  . aspirin 81 MG tablet Take 81 mg by mouth daily.    Marland Kitchen atorvastatin (LIPITOR) 40 MG tablet Take 1 tablet (40 mg total) by mouth daily. (Patient taking differently: Take 40 mg by mouth at bedtime. ) 30 tablet 6  . Calcium Carbonate-Vit D-Min (CALTRATE PLUS PO) Take 600 mg by mouth every morning.     . cyclobenzaprine (FLEXERIL) 10 MG tablet Take 10 mg by mouth at bedtime.     . donepezil (ARICEPT) 10 MG tablet Take 10 mg by mouth at bedtime.     Marland Kitchen doxazosin (CARDURA) 2 MG tablet Take 1 tablet by mouth at bedtime.    Marland Kitchen ezetimibe (ZETIA) 10 MG tablet Take 10 mg by mouth at bedtime.     . ferrous sulfate 325 (65 FE) MG EC tablet Take 325 mg by mouth every morning.    . furosemide (LASIX) 80 MG tablet Take 1 tablet by mouth 2 (two) times daily.    Marland Kitchen gabapentin (NEURONTIN) 300 MG capsule Take 1 capsule (300 mg total) by mouth 2 (two) times daily. 60 capsule 5  . hydrALAZINE (APRESOLINE) 100 MG tablet Take 100 mg by mouth 3 (three) times daily.    . insulin glargine (LANTUS) 100 UNIT/ML injection Inject 0.15 mLs (15  Units total) into the skin daily. (Patient taking differently: Inject 30 Units into the skin daily. ) 10 mL 11  . insulin NPH (HUMULIN N) 100 UNIT/ML injection Inject 2-13 Units into the skin 3 (three) times daily after meals. Sliding scale with each meal    . isosorbide mononitrate (IMDUR) 120 MG 24 hr tablet TAKE ONE (1) TABLET BY MOUTH EVERY DAY 30 tablet 0  . KLOR-CON M20 20 MEQ tablet Take 1 tablet by mouth daily.     Marland Kitchen LIDODERM 5 % Apply patch to area for 12 hours as needed for pain    . losartan (COZAAR) 100 MG tablet Take 1 tablet by mouth every morning.     . metolazone (ZAROXOLYN) 2.5 MG tablet Take 1 tablet by  mouth on Monday and Friday    . montelukast (SINGULAIR) 10 MG tablet Take 1 tablet (10 mg total) by mouth daily. (Patient taking differently: Take 10 mg by mouth at bedtime. ) 30 tablet 6  . Multiple Vitamins-Minerals (MULTIVITAMIN GUMMIES ADULT) CHEW Chew 1 capsule by mouth daily.    . nitroGLYCERIN (NITROSTAT) 0.4 MG SL tablet Place 1 tablet (0.4 mg total) under the tongue every 5 (five) minutes as needed for chest pain. 25 tablet 12  . omeprazole (PRILOSEC) 40 MG capsule Take one po daily 90 capsule 3  . OXYGEN Inhale into the lungs. CPAP with oxygen at bedtime    . raloxifene (EVISTA) 60 MG tablet Take 60 mg by mouth every morning.     . sucralfate (CARAFATE) 1 GM/10ML suspension Take 10 mLs (1 g total) by mouth 2 (two) times daily. 1260 mL 3  . SYMBICORT 160-4.5 MCG/ACT inhaler INHALE 2 PUFFS INTO LUNGS 2 TIMES DAILY 10.2 g 5  . traMADol (ULTRAM) 50 MG tablet Take 50 mg by mouth every 6 (six) hours as needed.    . Vitamin D, Ergocalciferol, (DRISDOL) 50000 UNITS CAPS capsule Take 50,000 Units by mouth every Monday.     Marland Kitchen aspirin 325 MG tablet Take 325 mg by mouth daily.     Marland Kitchen Cod Liver Oil CAPS Take 1 capsule by mouth daily.    Marland Kitchen dextromethorphan-guaiFENesin (ROBITUSSIN-DM) 10-100 MG/5ML liquid Take 10 mLs by mouth every 4 (four) hours as needed for cough.    . fluticasone (FLONASE) 50 MCG/ACT nasal spray Place 2 sprays into the nose daily. 16 g 5  . metoCLOPramide (REGLAN) 10 MG tablet Take 10 mg by mouth 4 (four) times daily.    . metoCLOPramide (REGLAN) 10 MG tablet TAKE ONE TABLET BEFORE MEALS AND AT BEDTIME 120 tablet 2   No current facility-administered medications for this visit.   Functional Status: In your present state of health, do you have any difficulty performing the following activities: 11/07/2014 10/21/2014  Hearing? - N  Vision? - N  Difficulty concentrating or making decisions? - N  Walking or climbing stairs? - Y  Dressing or bathing? - N  Doing errands, shopping? -  Facilities manager and eating ? Y -  Using the Toilet? N -  In the past six months, have you accidently leaked urine? N -  Do you have problems with loss of bowel control? N -  Managing your Medications? Y -  Managing your Finances? Y -  Housekeeping or managing your Housekeeping? Y -   Fall/Depression Screening: PHQ 2/9 Scores 11/09/2014 10/03/2014 04/07/2013  PHQ - 2 Score 4 6 2   PHQ- 9 Score 12 12 14   Exception Documentation - Other- indicate reason  in comment box -    Assessment: 1.  Blood pressure: Patient's blood pressure today has improved to 144/72.    2.  Medication management:  Patient had all prescribed medications.  Patient's caregiver [name] was able to fill patient's pill box with a minimal amount of coaching.  All medications were placed in pill box except tramadol and cyclobenzaprine per patient request.  [name] states he feels comfortable filling patient's pill box next week.    Plan: 1.  Patient will take all medications as prescribed.  2.  Patient will use pill box to organize medications with the help of her caregiver, [name].  Caregiver will fill patient's pill box next Wednesday.    3.  Scheduled follow up visit for next Wednesday, 11/16/14 at 1PM to review pill box filled by patient's caregiver.    THN CM Care Plan Problem One        Most Recent Value   Care Plan Problem One  Medication adherence   Role Documenting the Problem One  Clinical Pharmacist   Care Plan for Problem One  Active   THN CM Short Term Goal #1 (0-30 days)  In the next 30 days, patient will take all medications as prescribed as evidence by empty pill box.    THN CM Short Term Goal #1 Start Date  11/10/14   Interventions for Short Term Goal #1  Discussed imporatnce of mediation adhrence and taught patient's caregiver how to fill pill boxes.      [name], Pharm.D. Pharmacy Resident [place] [phone]

## 2014-11-11 ENCOUNTER — Encounter (HOSPITAL_COMMUNITY)
Admission: RE | Admit: 2014-11-11 | Discharge: 2014-11-11 | Disposition: A | Discharge status: Home / Self Care | Payer: Medicare Other | Source: Ambulatory Visit | Attending: Nephrology | Admitting: Nephrology

## 2014-11-11 DIAGNOSIS — D631 Anemia in chronic kidney disease: Secondary | ICD-10-CM | POA: Diagnosis not present

## 2014-11-11 DIAGNOSIS — N183 Chronic kidney disease, stage 3 (moderate): Secondary | ICD-10-CM | POA: Diagnosis not present

## 2014-11-11 MED ORDER — EPOETIN ALFA 20000 UNIT/ML IJ SOLN
INTRAMUSCULAR | Status: AC
  Filled 2014-11-11: qty 1

## 2014-11-11 MED ORDER — EPOETIN ALFA 20000 UNIT/ML IJ SOLN
20000.0000 [IU] | INTRAMUSCULAR | Status: DC
  Administered 2014-11-11: 20000 [IU] via SUBCUTANEOUS

## 2014-11-14 LAB — POCT HEMOGLOBIN-HEMACUE: Hemoglobin: 10.3 g/dL — ABNORMAL LOW (ref 12.0–15.0)

## 2014-11-16 ENCOUNTER — Other Ambulatory Visit: Payer: Self-pay | Admitting: Pharmacist

## 2014-11-16 NOTE — Patient Outreach (Signed)
Joann Gutierrez) Care Management  [place]   11/16/2014  Joann Gutierrez [birth year] [id number]  Subjective: Joann Gutierrez is a 69yo female referred to [place] for medication review and education.  I made home visit today to follow up on pill box fills.  Visit attended by pharmacy student, Alta Corning.  Patient's caregiver Joann Gutierrez was also present for part of the visit.    I reviewed all of patient's medications.  Patient had all prescription medications available.  Patient reports she has used pill box to take medications since my visit on 11/09/14, and caregiver reports she has not missed any doses in the last week.  Patient's caregiver Joann Gutierrez filled pillbox today before I arrived at the house.  I reviewed pill box with Joann Gutierrez.  Noted several discrepancies in filled pill box.  Metolazone had been placed in box for every day of the week instead of twice weekly on Monday/Friday as prescribed.  Aspirin 81mg  daily was not placed in pill box and metoclopramide was omitted from the morning and bedtime slots.  I discussed my findings with patient's caregiver and corrected the pill box.  A second weekly pill box was also filled while I was at the house.  All medications were placed in the pill box except tramadol and cyclobenzaprine per patient preference and cod liver oil and fish oil due to capsule size.  Patient reports difficulty with pill burden in the morning.  She reports that taking that many medications at once makes her feel funny and nauseous at times.  To lessen pill burden in the morning, moved several of her vitamins to the noon slot in the pill box including multivitamin and calcium/vitamin D.  Counseled patient to report her concerns to her primary care if those feelings continue.    Patient continues to report some swelling in her legs.  She denies worsening of edema.  She denies difficulty breathing.  She reports weighing most days but has not been  recording her weights.  I encouraged her to continue to weigh daily at the same time each day and record weights.  I reviewed heart failure zones and action plan in [place] with patient.      Patient has office visit with [name] on 11/30/14.    Objective:  Blood pressure:  162/70  Current Medications: Current Outpatient Prescriptions  Medication Sig Dispense Refill  . albuterol (VENTOLIN HFA) 108 (90 BASE) MCG/ACT inhaler Inhale 2 puffs into the lungs every 6 (six) hours as needed for shortness of breath. 1 Inhaler 11  . aspirin 325 MG tablet Take 325 mg by mouth daily.     Marland Kitchen aspirin 81 MG tablet Take 81 mg by mouth daily.    Marland Kitchen atorvastatin (LIPITOR) 40 MG tablet Take 1 tablet (40 mg total) by mouth daily. (Patient taking differently: Take 40 mg by mouth at bedtime. ) 30 tablet 6  . Calcium Carbonate-Vit D-Min (CALTRATE PLUS PO) Take 600 mg by mouth every morning.     Marland Kitchen Cod Liver Oil CAPS Take 1 capsule by mouth daily.    . cyclobenzaprine (FLEXERIL) 10 MG tablet Take 10 mg by mouth at bedtime.     . donepezil (ARICEPT) 10 MG tablet Take 10 mg by mouth at bedtime.     Marland Kitchen doxazosin (CARDURA) 2 MG tablet Take 1 tablet by mouth at bedtime.    Marland Kitchen ezetimibe (ZETIA) 10 MG tablet Take 10 mg by mouth at bedtime.     Marland Kitchen  ferrous sulfate 325 (65 FE) MG EC tablet Take 325 mg by mouth every morning.    . fluticasone (FLONASE) 50 MCG/ACT nasal spray Place 2 sprays into both nostrils daily. 16 g 11  . furosemide (LASIX) 80 MG tablet Take 1 tablet by mouth 2 (two) times daily.    Marland Kitchen gabapentin (NEURONTIN) 300 MG capsule Take 1 capsule (300 mg total) by mouth 2 (two) times daily. 60 capsule 5  . hydrALAZINE (APRESOLINE) 100 MG tablet Take 100 mg by mouth 3 (three) times daily.    . insulin glargine (LANTUS) 100 UNIT/ML injection Inject 0.15 mLs (15 Units total) into the skin daily. (Patient taking differently: Inject 30 Units into the skin daily. ) 10 mL 11  . insulin NPH (HUMULIN N) 100 UNIT/ML  injection Inject 2-13 Units into the skin 3 (three) times daily after meals. Sliding scale with each meal    . isosorbide mononitrate (IMDUR) 120 MG 24 hr tablet TAKE ONE (1) TABLET BY MOUTH EVERY DAY 30 tablet 0  . KLOR-CON M20 20 MEQ tablet Take 1 tablet by mouth daily.     Marland Kitchen LIDODERM 5 % Apply patch to area for 12 hours as needed for pain    . losartan (COZAAR) 100 MG tablet Take 1 tablet by mouth every morning.     . metoCLOPramide (REGLAN) 10 MG tablet TAKE ONE TABLET BEFORE MEALS AND AT BEDTIME 120 tablet 2  . metolazone (ZAROXOLYN) 2.5 MG tablet Take 1 tablet by mouth on Monday and Friday    . montelukast (SINGULAIR) 10 MG tablet Take 1 tablet (10 mg total) by mouth daily. 30 tablet 11  . Multiple Vitamins-Minerals (MULTIVITAMIN GUMMIES ADULT) CHEW Chew 1 capsule by mouth daily.    . nitroGLYCERIN (NITROSTAT) 0.4 MG SL tablet Place 1 tablet (0.4 mg total) under the tongue every 5 (five) minutes as needed for chest pain. 25 tablet 12  . omeprazole (PRILOSEC) 40 MG capsule Take one po daily 90 capsule 3  . OXYGEN Inhale into the lungs. CPAP with oxygen at bedtime    . raloxifene (EVISTA) 60 MG tablet Take 60 mg by mouth every morning.     . sucralfate (CARAFATE) 1 GM/10ML suspension Take 10 mLs (1 g total) by mouth 2 (two) times daily. 1260 mL 3  . SYMBICORT 160-4.5 MCG/ACT inhaler INHALE 2 PUFFS INTO LUNGS 2 TIMES DAILY 10.2 g 5  . traMADol (ULTRAM) 50 MG tablet Take 50 mg by mouth every 6 (six) hours as needed.    . Vitamin D, Ergocalciferol, (DRISDOL) 50000 UNITS CAPS capsule Take 50,000 Units by mouth every Monday.     Marland Kitchen dextromethorphan-guaiFENesin (ROBITUSSIN-DM) 10-100 MG/5ML liquid Take 10 mLs by mouth every 4 (four) hours as needed for cough.     No current facility-administered medications for this visit.   Functional Status: In your present state of health, do you have any difficulty performing the following activities: 11/07/2014 10/21/2014  Hearing? - N  Vision? - N   Difficulty concentrating or making decisions? - N  Walking or climbing stairs? - Y  Dressing or bathing? - N  Doing errands, shopping? - Facilities manager and eating ? Y -  Using the Toilet? N -  In the past six months, have you accidently leaked urine? N -  Do you have problems with loss of bowel control? N -  Managing your Medications? Y -  Managing your Finances? Y -  Housekeeping or managing your Housekeeping? Y -  Fall/Depression Screening: PHQ 2/9 Scores 11/09/2014 10/03/2014 04/07/2013  PHQ - 2 Score 4 6 2   PHQ- 9 Score 12 12 14   Exception Documentation - Other- indicate reason in comment box -   Assessment: 1.  Medication management:  Patient had all prescribed medications.  Patient's caregiver Joann Gutierrez filled patient's pill box today before the visit.  Coaching was required during visit to correct several medications in the pill box.  I will continue to work with patient and caregiver on creating a method to correctly fill pill boxes.    2.  Medication adherence:  Patient reports compliance with all medications.    3.  Blood pressure:  Patient's blood pressure today above goal of <140/90.  Patient reports compliance with blood pressure medications.      4.  CHF:  Patient reports understanding of heart failure zones.  Discussed importance of weighing daily at the same time each day and recording weights.  Counseled to call primary care provider if weight increases 3 pounds overnight.    Plan: 1.  Medication management:  Assisted in filling two weeks of pill boxes for the patient.  The only medications that are not in the pill box are tramadol and cyclobenzaprine per patient preferrence and cod liver oil and fish oil due to pill size.   2.  Patient will continue take all medications as prescribed.   3.  Patient will weigh daily and record weights.  Patient will follow heart zones and call primary care if weight increases 3 pounds overnight.      4.  Patient has follow up visit  with [name] on 11/30/14.   5.  I scheduled follow up home visit to assist with pill boxes on 12/01/14 at [place] Problem One        Most Recent Value   Care Plan Problem One  Medication adherence   Role Documenting the Problem One  Clinical Pharmacist   Care Plan for Problem One  Active   THN CM Short Term Goal #1 (0-30 days)  In the next 30 days, patient will take all medications as prescribed as evidence by empty pill box.    THN CM Short Term Goal #1 Start Date  11/10/14   Interventions for Short Term Goal #1  Patient's caregiver filled patients pillbox today before I arrived.  He reports that patient had been adherent to medications over the past week and had not missed any doses.  I reviewed filled pill box and corrected any discrepencies identified.      [name], Pharm.D. Pharmacy Resident [place] [phone]

## 2014-11-18 ENCOUNTER — Encounter (HOSPITAL_COMMUNITY)
Admission: RE | Admit: 2014-11-18 | Discharge: 2014-11-18 | Disposition: A | Discharge status: Home / Self Care | Payer: Medicare Other | Source: Ambulatory Visit | Attending: Nephrology | Admitting: Nephrology

## 2014-11-18 DIAGNOSIS — D631 Anemia in chronic kidney disease: Secondary | ICD-10-CM | POA: Diagnosis not present

## 2014-11-18 DIAGNOSIS — N183 Chronic kidney disease, stage 3 (moderate): Secondary | ICD-10-CM | POA: Diagnosis not present

## 2014-11-18 LAB — POCT HEMOGLOBIN-HEMACUE: Hemoglobin: 10.2 g/dL — ABNORMAL LOW (ref 12.0–15.0)

## 2014-11-18 LAB — IRON AND TIBC
IRON: 58 ug/dL (ref 28–170)
SATURATION RATIOS: 22 % (ref 10.4–31.8)
TIBC: 260 ug/dL (ref 250–450)
UIBC: 202 ug/dL

## 2014-11-18 LAB — FERRITIN: Ferritin: 75 ng/mL (ref 11–307)

## 2014-11-18 MED ORDER — EPOETIN ALFA 20000 UNIT/ML IJ SOLN
20000.0000 [IU] | INTRAMUSCULAR | Status: DC
  Administered 2014-11-18: 20000 [IU] via SUBCUTANEOUS

## 2014-11-18 MED ORDER — EPOETIN ALFA 20000 UNIT/ML IJ SOLN
INTRAMUSCULAR | Status: AC
  Filled 2014-11-18: qty 1

## 2014-11-24 ENCOUNTER — Other Ambulatory Visit: Payer: Self-pay

## 2014-11-24 NOTE — Patient Outreach (Signed)
[facility]) Care Management  11/24/2014  Joann Gutierrez 02-03-1945 [id number]   Unable to locate patient for scheduled home visit. Patient  Did not answer telephone when called at [phone]

## 2014-11-25 ENCOUNTER — Encounter (HOSPITAL_COMMUNITY)
Admission: RE | Admit: 2014-11-25 | Discharge: 2014-11-25 | Disposition: A | Discharge status: Home / Self Care | Payer: Medicare Other | Source: Ambulatory Visit | Attending: Nephrology | Admitting: Nephrology

## 2014-11-25 DIAGNOSIS — D631 Anemia in chronic kidney disease: Secondary | ICD-10-CM | POA: Diagnosis not present

## 2014-11-25 DIAGNOSIS — N183 Chronic kidney disease, stage 3 (moderate): Secondary | ICD-10-CM | POA: Diagnosis not present

## 2014-11-25 LAB — POCT HEMOGLOBIN-HEMACUE: Hemoglobin: 11.2 g/dL — ABNORMAL LOW (ref 12.0–15.0)

## 2014-11-25 MED ORDER — EPOETIN ALFA 20000 UNIT/ML IJ SOLN
20000.0000 [IU] | INTRAMUSCULAR | Status: DC
  Administered 2014-11-25: 20000 [IU] via SUBCUTANEOUS

## 2014-11-25 MED ORDER — EPOETIN ALFA 20000 UNIT/ML IJ SOLN
INTRAMUSCULAR | Status: AC
  Filled 2014-11-25: qty 1

## 2014-11-30 DIAGNOSIS — E7800 Pure hypercholesterolemia, unspecified: Secondary | ICD-10-CM | POA: Diagnosis not present

## 2014-11-30 DIAGNOSIS — N183 Chronic kidney disease, stage 3 (moderate): Secondary | ICD-10-CM | POA: Diagnosis not present

## 2014-11-30 DIAGNOSIS — I2510 Atherosclerotic heart disease of native coronary artery without angina pectoris: Secondary | ICD-10-CM | POA: Diagnosis not present

## 2014-11-30 DIAGNOSIS — R413 Other amnesia: Secondary | ICD-10-CM | POA: Diagnosis not present

## 2014-11-30 DIAGNOSIS — J45909 Unspecified asthma, uncomplicated: Secondary | ICD-10-CM | POA: Diagnosis not present

## 2014-11-30 DIAGNOSIS — I10 Essential (primary) hypertension: Secondary | ICD-10-CM | POA: Diagnosis not present

## 2014-11-30 DIAGNOSIS — E1322 Other specified diabetes mellitus with diabetic chronic kidney disease: Secondary | ICD-10-CM | POA: Diagnosis not present

## 2014-12-01 ENCOUNTER — Encounter: Payer: Self-pay | Admitting: Pharmacist

## 2014-12-01 ENCOUNTER — Other Ambulatory Visit: Payer: Self-pay | Admitting: Pharmacist

## 2014-12-01 ENCOUNTER — Other Ambulatory Visit: Payer: Self-pay

## 2014-12-01 NOTE — Patient Outreach (Signed)
[facility]) Care Management  [place]   12/01/2014  Joann Gutierrez April 11, 1945 [id number]  Subjective: Joann Gutierrez is a 69yo who I am following for medication adherence.  I made joint home visit today with [facility] CMRN Loni Muse.  Patient's caregiver [name] was also present for visit.    Patient reports she is good today.  Patient denies any swelling and reports her weight has been stable.  Patient states she had an appointment with her primary care provider, [name], yesterday.  She reports he decreased her hydralazine dose from 100mg  to 50mg  TID.  Patient had paperwork from her primary care appointment that confirmed hydralazine dose of 50mg  TID.  I called [facility] to check on delivery of hydralazine 50mg  tablets, but [facility] reports they have not received new prescription.  Called Dr. Lina Sar office to request a new prescription for 50mg  tablets to be sent to [facility] pharmacy.  Patient has pill splitter to cut 100mg  tablets in half until she receives a new prescription.    At my last home visit, I filled two weeks of pill boxes.  Patient had her two pill boxes available at the house.  One pill box was completely empty, but patient has missed many doses of her medications in the past week.  Patient reports concerns about pill burden in the morning.  She appears to be taking select medications out of her morning pill box such as omeprazole, losartan, isosorbide, furosemide.  She also reports concern that her medications may be causing swelling.  Patient reports she voiced concerns to her primary care provider but no medication changes were made except reduction of hydralazine dose.  Encouraged patient to let her provider know if she has further concerns with her medications.     Objective:   Current Medications: Current Outpatient Prescriptions  Medication Sig Dispense Refill  . albuterol (VENTOLIN HFA) 108 (90 BASE) MCG/ACT inhaler Inhale 2  puffs into the lungs every 6 (six) hours as needed for shortness of breath. 1 Inhaler 11  . aspirin 81 MG tablet Take 81 mg by mouth daily.    Marland Kitchen atorvastatin (LIPITOR) 40 MG tablet Take 1 tablet (40 mg total) by mouth daily. (Patient taking differently: Take 40 mg by mouth at bedtime. ) 30 tablet 6  . Calcium Carbonate-Vit D-Min (CALTRATE PLUS PO) Take 600 mg by mouth every morning.     Marland Kitchen Cod Liver Oil CAPS Take 1 capsule by mouth daily.    . cyclobenzaprine (FLEXERIL) 10 MG tablet Take 10 mg by mouth at bedtime.     . donepezil (ARICEPT) 10 MG tablet Take 10 mg by mouth at bedtime.     Marland Kitchen doxazosin (CARDURA) 2 MG tablet Take 1 tablet by mouth at bedtime.    Marland Kitchen ezetimibe (ZETIA) 10 MG tablet Take 10 mg by mouth at bedtime.     . ferrous sulfate 325 (65 FE) MG EC tablet Take 325 mg by mouth every morning.    . fluticasone (FLONASE) 50 MCG/ACT nasal spray Place 2 sprays into both nostrils daily. 16 g 11  . furosemide (LASIX) 80 MG tablet Take 1 tablet by mouth 2 (two) times daily.    Marland Kitchen gabapentin (NEURONTIN) 300 MG capsule Take 1 capsule (300 mg total) by mouth 2 (two) times daily. 60 capsule 5  . hydrALAZINE (APRESOLINE) 100 MG tablet Take 50 mg by mouth 3 (three) times daily.     . insulin glargine (LANTUS) 100 UNIT/ML injection Inject 0.15 mLs (  15 Units total) into the skin daily. (Patient taking differently: Inject 30 Units into the skin daily. ) 10 mL 11  . insulin NPH (HUMULIN N) 100 UNIT/ML injection Inject 2-13 Units into the skin 3 (three) times daily after meals. Sliding scale with each meal    . isosorbide mononitrate (IMDUR) 120 MG 24 hr tablet TAKE ONE (1) TABLET BY MOUTH EVERY DAY 30 tablet 0  . KLOR-CON M20 20 MEQ tablet Take 1 tablet by mouth daily.     Marland Kitchen LIDODERM 5 % Apply patch to area for 12 hours as needed for pain    . losartan (COZAAR) 100 MG tablet Take 1 tablet by mouth every morning.     . metoCLOPramide (REGLAN) 10 MG tablet TAKE ONE TABLET BEFORE MEALS AND AT BEDTIME 120  tablet 2  . metolazone (ZAROXOLYN) 2.5 MG tablet Take 1 tablet by mouth on Monday and Friday    . montelukast (SINGULAIR) 10 MG tablet Take 1 tablet (10 mg total) by mouth daily. 30 tablet 11  . Multiple Vitamins-Minerals (MULTIVITAMIN GUMMIES ADULT) CHEW Chew 1 capsule by mouth daily.    . nitroGLYCERIN (NITROSTAT) 0.4 MG SL tablet Place 1 tablet (0.4 mg total) under the tongue every 5 (five) minutes as needed for chest pain. 25 tablet 12  . omeprazole (PRILOSEC) 40 MG capsule Take one po daily 90 capsule 3  . OXYGEN Inhale into the lungs. CPAP with oxygen at bedtime    . raloxifene (EVISTA) 60 MG tablet Take 60 mg by mouth every morning.     . sucralfate (CARAFATE) 1 GM/10ML suspension Take 10 mLs (1 g total) by mouth 2 (two) times daily. 1260 mL 3  . SYMBICORT 160-4.5 MCG/ACT inhaler INHALE 2 PUFFS INTO LUNGS 2 TIMES DAILY 10.2 g 5  . traMADol (ULTRAM) 50 MG tablet Take 50 mg by mouth every 6 (six) hours as needed.    . Vitamin D, Ergocalciferol, (DRISDOL) 50000 UNITS CAPS capsule Take 50,000 Units by mouth every Monday.     Marland Kitchen aspirin 325 MG tablet Take 325 mg by mouth daily.     Marland Kitchen dextromethorphan-guaiFENesin (ROBITUSSIN-DM) 10-100 MG/5ML liquid Take 10 mLs by mouth every 4 (four) hours as needed for cough.     No current facility-administered medications for this visit.   Functional Status: In your present state of health, do you have any difficulty performing the following activities: 11/07/2014 10/21/2014  Hearing? - N  Vision? - N  Difficulty concentrating or making decisions? - N  Walking or climbing stairs? - Y  Dressing or bathing? - N  Doing errands, shopping? - Facilities manager and eating ? Y -  Using the Toilet? N -  In the past six months, have you accidently leaked urine? N -  Do you have problems with loss of bowel control? N -  Managing your Medications? Y -  Managing your Finances? Y -  Housekeeping or managing your Housekeeping? Y -   Fall/Depression  Screening: PHQ 2/9 Scores 11/09/2014 10/03/2014 04/07/2013  PHQ - 2 Score 4 6 2   PHQ- 9 Score 12 12 14   Exception Documentation - Other- indicate reason in comment box -   Assessment: 1.  Medication management:  Patient had all prescribed medications except for new prescribed dose of hydralazine 50mg  tablets.  Patient has supply of 100mg  tablets available and a pill splitter to cut tablets in half until new prescription is delivered.  Assisted patient in filling pill boxes for two weeks.  2.  Medication adherence:  Patient has been approximately 50% to 75% compliant with medications in the past two weeks.  To reduce pill burden in the morning, discussed with patient that she does not have to take all morning medications at once and discussed taking half then taking the other half 30 minutes to an hour later.  Encouraged compliance with medications.    Discussed concern of swelling with medications.  Per review of medications, gabapentin and raloxifene may have associated peripheral edema.    Patient has already voiced concerns to provider.  Encouraged patient to call her provider's office if she has further concerns with her medications.    Plan: 1.  Medication management:  Assisted in filling two weeks of pill boxes for the patient.  Hydralazine was not included in the box as patient is waiting for new prescription of 50mg  tablets.  Counseled patient to cut 100mg  tablets in half with pill splitter and take three times daily as prescribed until she receives a new prescription for the 50mg  tablets.  Tramadol and cyclobenzaprine were not included in pill box per patient preference.   2.  Encouraged patient to take all medications as prescribed and report any concerns regarding her medications to her primary care provider.   3.  Follow up home visit scheduled for 12/13/14 at 10:00AM.    [name], Pharm.D. Pharmacy Resident [place] [phone]

## 2014-12-02 ENCOUNTER — Encounter (HOSPITAL_COMMUNITY)
Admission: RE | Admit: 2014-12-02 | Discharge: 2014-12-02 | Disposition: A | Discharge status: Home / Self Care | Payer: Medicare Other | Source: Ambulatory Visit | Attending: Nephrology | Admitting: Nephrology

## 2014-12-02 ENCOUNTER — Other Ambulatory Visit: Payer: Self-pay | Admitting: Pharmacist

## 2014-12-02 DIAGNOSIS — D631 Anemia in chronic kidney disease: Secondary | ICD-10-CM | POA: Diagnosis not present

## 2014-12-02 DIAGNOSIS — N183 Chronic kidney disease, stage 3 (moderate): Secondary | ICD-10-CM | POA: Diagnosis not present

## 2014-12-02 LAB — POCT HEMOGLOBIN-HEMACUE: Hemoglobin: 11.2 g/dL — ABNORMAL LOW (ref 12.0–15.0)

## 2014-12-02 MED ORDER — EPOETIN ALFA 20000 UNIT/ML IJ SOLN
20000.0000 [IU] | INTRAMUSCULAR | Status: DC
  Administered 2014-12-02: 20000 [IU] via SUBCUTANEOUS

## 2014-12-02 MED ORDER — EPOETIN ALFA 20000 UNIT/ML IJ SOLN
INTRAMUSCULAR | Status: AC
  Filled 2014-12-02: qty 1

## 2014-12-02 NOTE — Patient Outreach (Signed)
[facility]) Care Management  December 01, 2014   Joann Gutierrez 1945/09/10 [id number]    Home visit scheduled for next week to assess patient's compliance and understanding of medication regimen.

## 2014-12-02 NOTE — Patient Outreach (Signed)
[place] [facility]) Care Management  12/02/2014  Joann Gutierrez 07/06/1945 [id number]   Care coordination:  Received a voicemail from Carlyon Shadow, nurse at Dr. Lina Sar office, who returned my call regarding updated prescription for hydralazine 50mg  tablets.  In the voicemail, [place] reports hydralazine dose is 100mg  TID.   Made a second phone call to [place], but was unable to reach her.  Left another voicemail with Carlyon Shadow stating that patient reported the doctor reduced hydralazine dose to 50mg  TID and requested clarification as to whether dose was decreased at appointment on 11/30/14.  Made request for Darlene to call patient to clarify which dose patient is to be taking, and also left my phone number for Darlene.  I will follow up with patient on Monday, 12/05/14.     [name], Pharm.D. Pharmacy Resident [place] [phone]

## 2014-12-05 ENCOUNTER — Other Ambulatory Visit: Payer: Self-pay | Admitting: Pharmacist

## 2014-12-05 NOTE — Patient Outreach (Signed)
[facility]) Care Management  12/05/2014  Joann Gutierrez Apr 16, 1945 [id number]   Care coordination: I made outreach call to [facility] to follow up on whether she heard from Dr. Lina Sar office about dose clarification of hydralazine that was requested on Friday, 12/02/14.  There was no answer.  I left a HIPAA complaint voicemail for patient to return my call.  I will reach out on Wednesday, 12/07/14 if patient does not return my call.     [name], Pharm.D. Pharmacy Resident [place] [phone]

## 2014-12-06 ENCOUNTER — Other Ambulatory Visit: Payer: Self-pay | Admitting: Pharmacist

## 2014-12-06 IMAGING — CR DG CHEST 1V
1 series · 1 of 1 positions shown · non-contrast
Comparison: September 11, 2013

CLINICAL DATA: Difficulty breathing and chest pain

EXAM:
CHEST - 1 VIEW

[view not recorded]
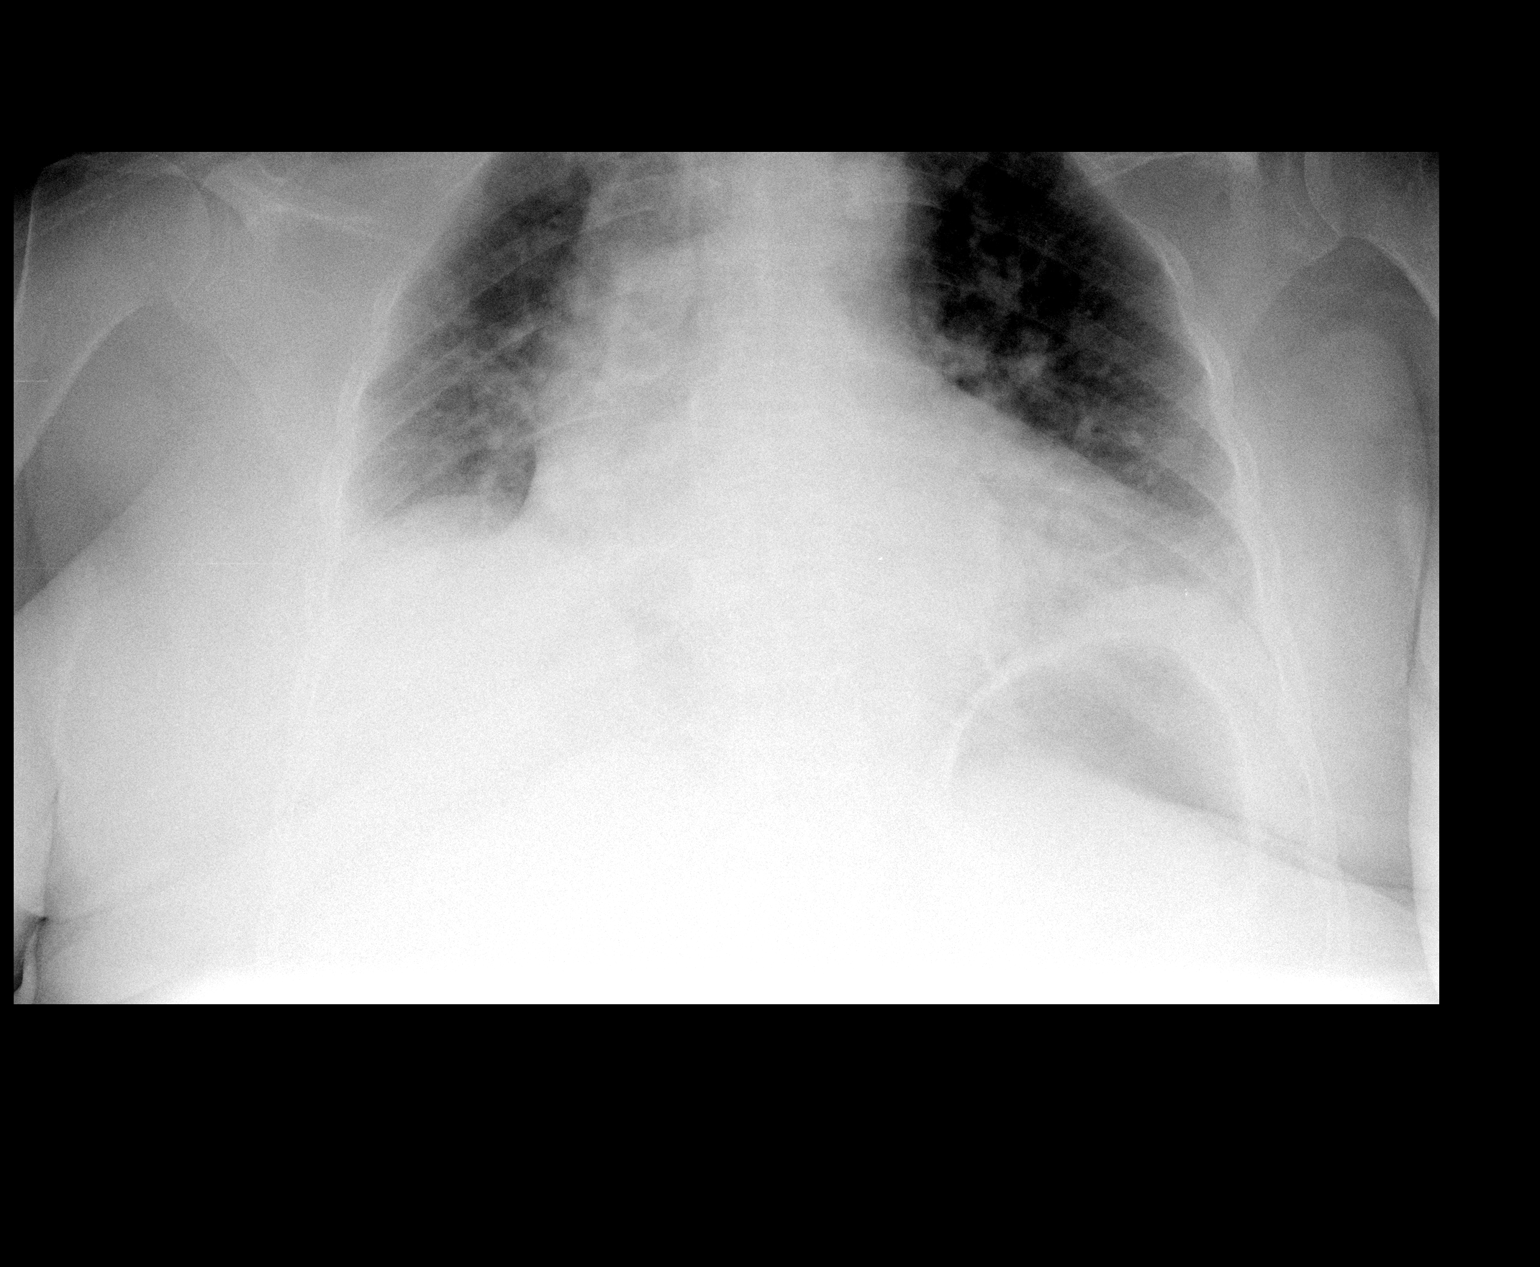

[1 of 1 positions shown; findings below may reference images not displayed]

FINDINGS: Degree of inspiration is shallow. There is no edema or
consolidation. Heart is upper normal in size with pulmonary
vascularity within normal limits. No pneumothorax. No adenopathy. No
bone lesions.
IMPRESSION: Shallow degree of inspiration.  No edema or consolidation.

## 2014-12-06 IMAGING — CT CT ABD-PELV W/ CM
2 of 6 series · 16 of 46 positions shown, 18 images · IV contrast (Omni 300)
Comparison: Abdominal ultrasound of today's date

CLINICAL DATA: Abdominal pain, elevated LFTs, mildly compromised
renal function

EXAM:
CT ABDOMEN AND PELVIS WITH CONTRAST
TECHNIQUE: Multidetector CT imaging of the abdomen and pelvis was performed
using the standard protocol following bolus administration of
intravenous contrast.
CONTRAST:  80mL OMNIPAQUE IOHEXOL 300 MG/ML  SOLN

[Series 2: abd/ pelvis 5.0 i30f 1 · axial · 0.73mm/px · z∈[+877,+1287]mm · 13 of 96 slices shown, 15 images]
[im 7/96  soft-tissue]
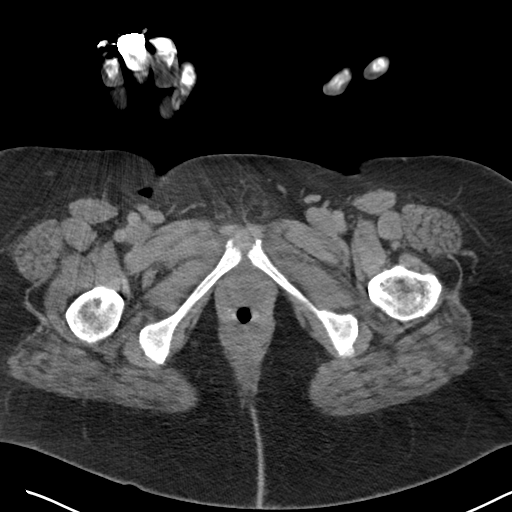
[im 7/96  bone]
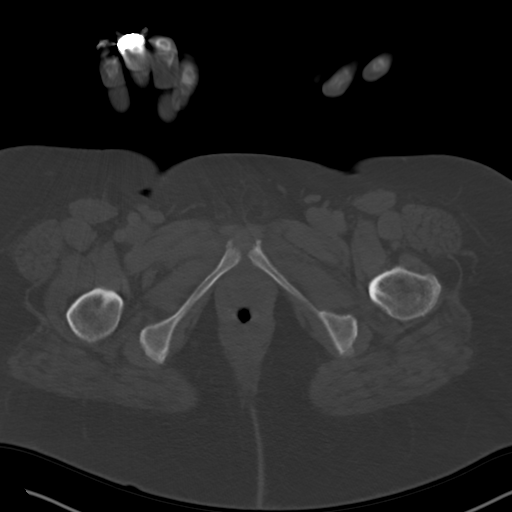
[im 13/96  soft-tissue]
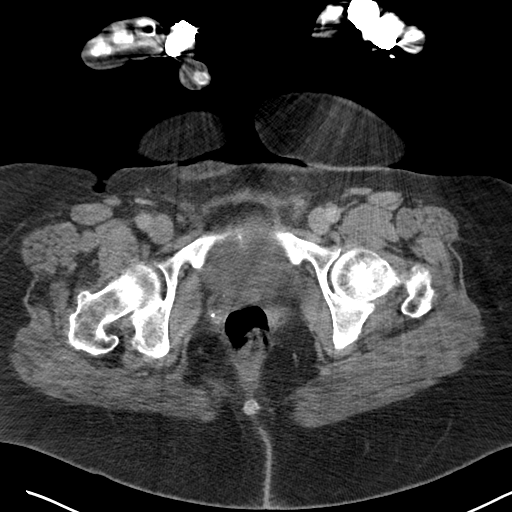
[im 20/96  soft-tissue]
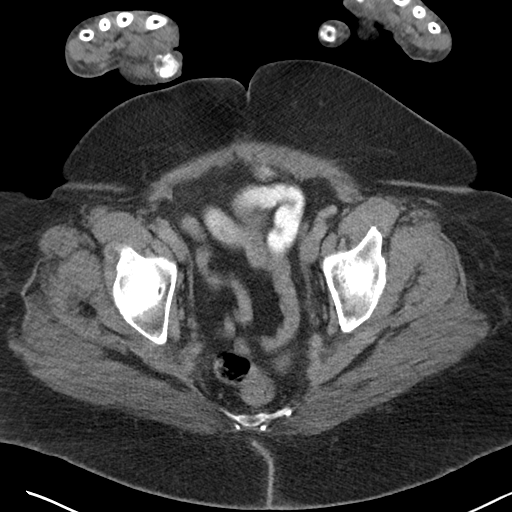
[im 26/96  soft-tissue]
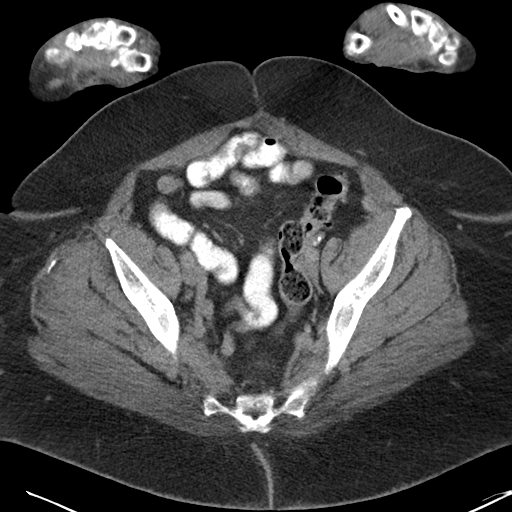
[im 32/96  soft-tissue]
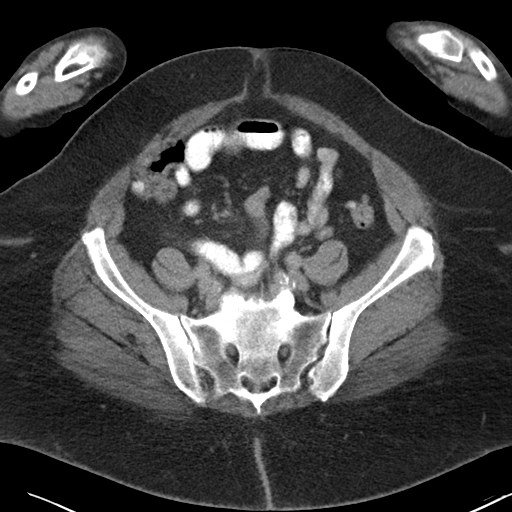
[im 39/96  soft-tissue]
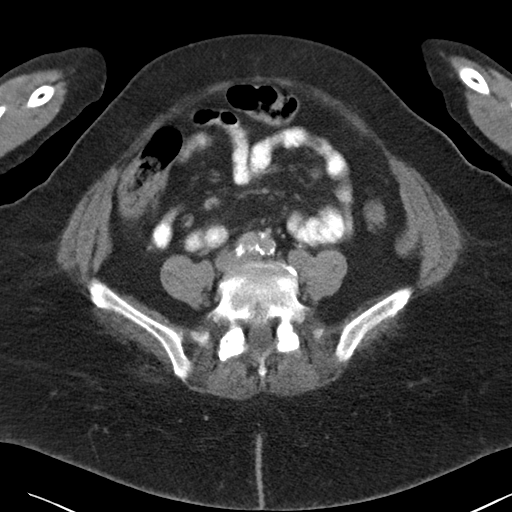
[im 51/96  soft-tissue]
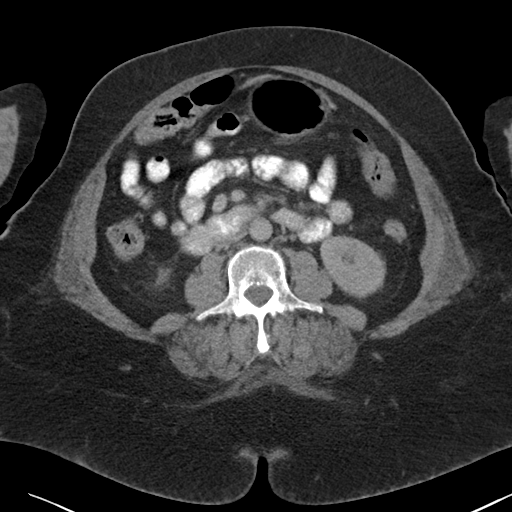
[im 58/96  soft-tissue]
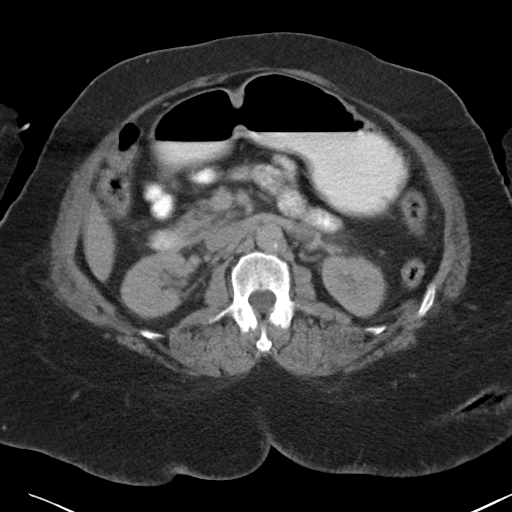
[im 64/96  soft-tissue]
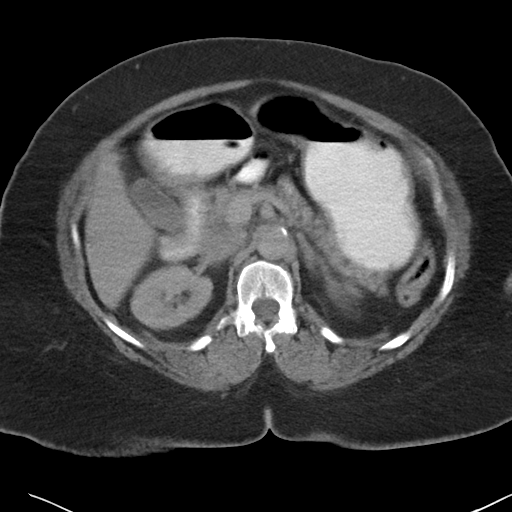
[im 64/96  bone]
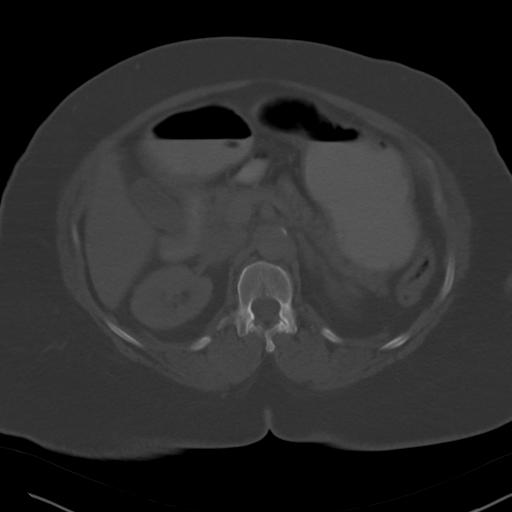
[im 70/96  soft-tissue]
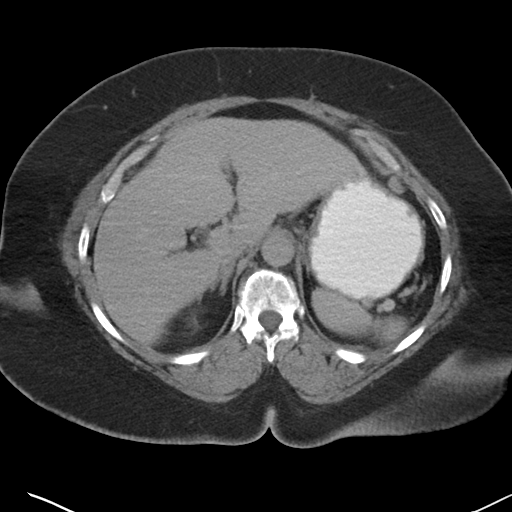
[im 77/96  soft-tissue]
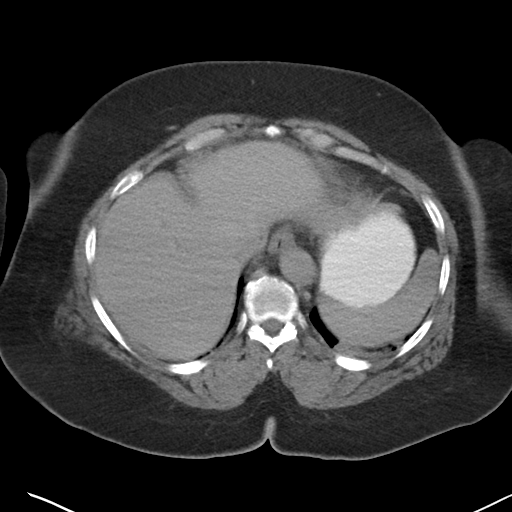
[im 83/96  soft-tissue]
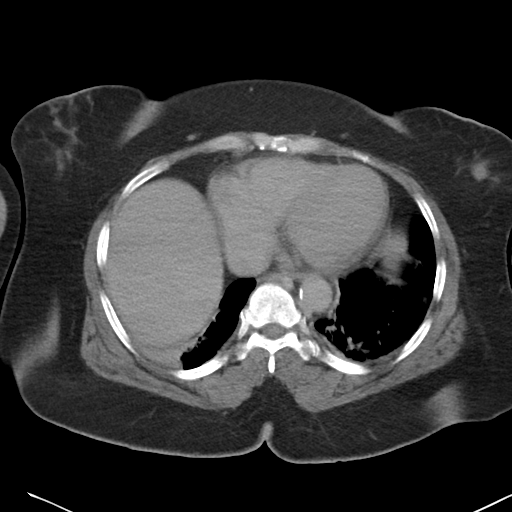
[im 89/96  soft-tissue]
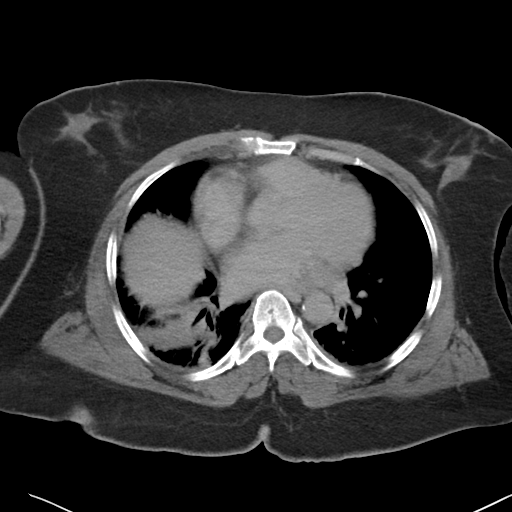

[Series 5: coronals · coronal · 0.70mm/px · 3 of 151 slices shown]
[im 51/151  soft-tissue]
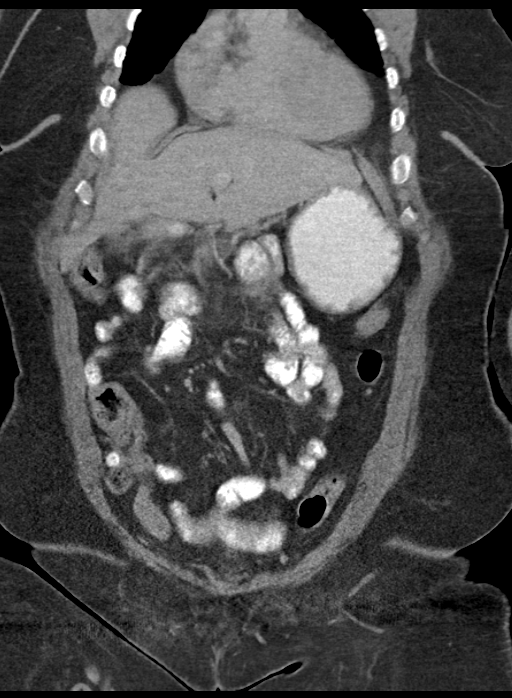
[im 67/151  soft-tissue]
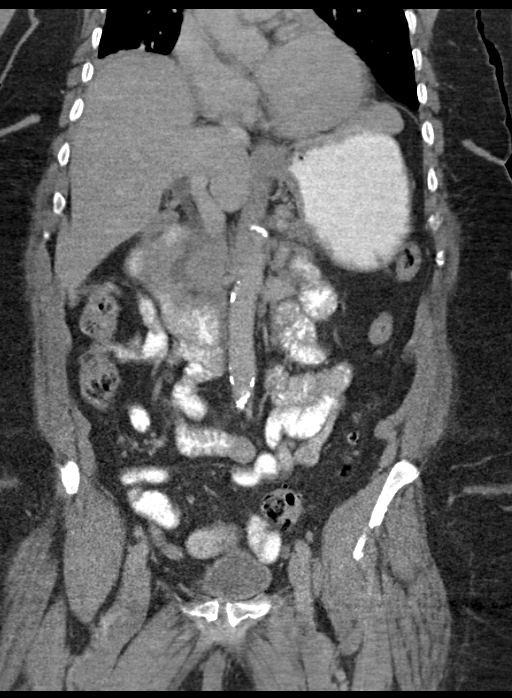
[im 84/151  soft-tissue]
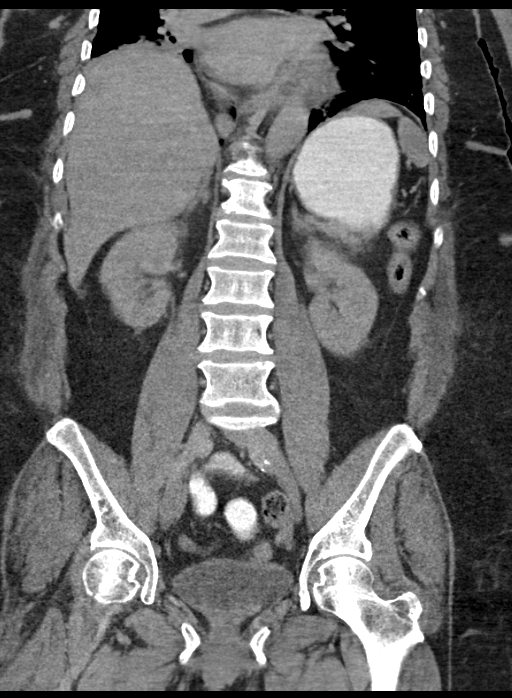

[16 of 46 positions shown; findings below may reference images not displayed]

FINDINGS: The stomach is moderately distended with the oral contrast as well
as with gas. The liver exhibits no focal mass or ductal dilation.
The gallbladder is adequately distended now with no evidence of
stones. The pancreas exhibits no focal mass or definite inflammatory
change. The spleen, adrenal glands, and kidneys exhibit no acute
abnormalities. There is stable hypodensity in the lower pole of the
left kidney which exhibits HU value of 17 most compatible with a
cyst. A 1 cm hypodensity in the midpole of the right kidney exhibits
HU value of 1.

The contrast has traversed much of the small bowel but has not yet
reached the colon. There is no evidence of ileus or obstruction or
acute inflammation. There is no inguinal hernia. There is a
umbilical hernia containing fat.

The caliber of the abdominal aorta is normal. The small and large
bowel exhibit no evidence of ileus, obstruction, or acute
inflammation. There is no ascites. The urinary bladder, prostate
gland, and seminal vesicles are normal. The lumbar spine and bony
pelvis exhibit no acute abnormalities. There are degenerative disc
changes at L5-S1. The lung bases exhibit increased densities
posteriorly, bilaterally consistent with atelectasis or pneumonia.
IMPRESSION: 1. Bibasilar atelectasis or pneumonia.
2. The gallbladder is visualized and appears normal. There is no
acute hepatobiliary abnormality.
3. There is no acute urinary tract or bowel abnormality.

## 2014-12-06 IMAGING — US US ABDOMEN LIMITED
1 series · 14 of 21 positions shown · non-contrast
Comparison: CT Abdomen and Pelvis 01/30/2009.

CLINICAL DATA: 68-year-old female with abdominal pain, right upper
quadrant pain, abnormal LFTs. Initial encounter.

EXAM:
US ABDOMEN LIMITED - RIGHT UPPER QUADRANT

[Series 1: us abdomen limited · 0.26mm/px · 21 acquisitions, 14 frames shown]
[im 1/21]
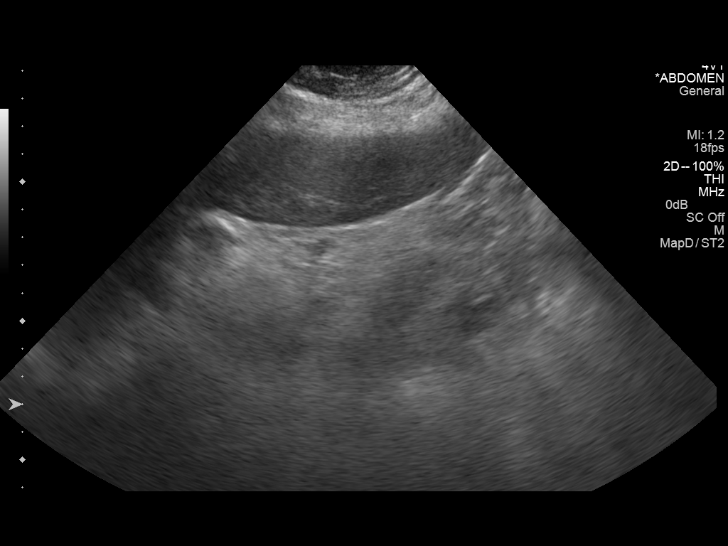
[im 3/21]
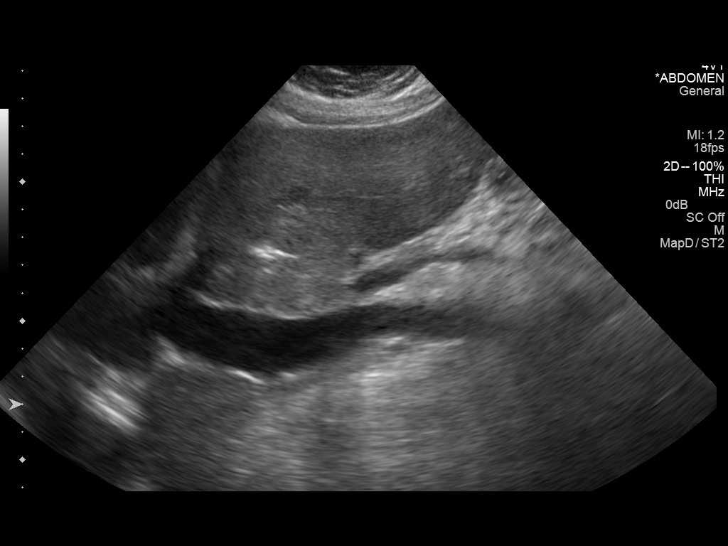
[im 4/21]
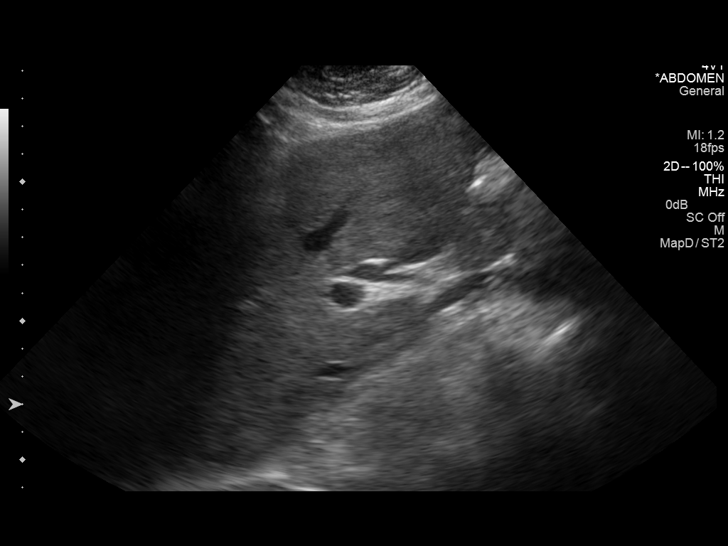
[im 6/21]
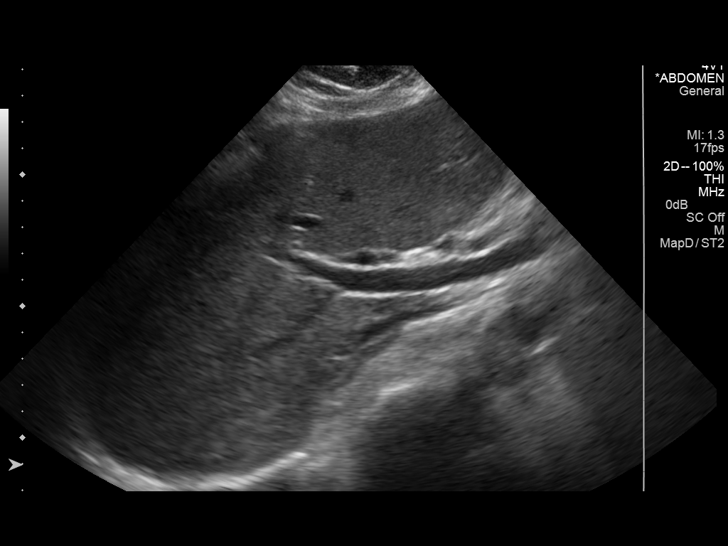
[im 7/21]
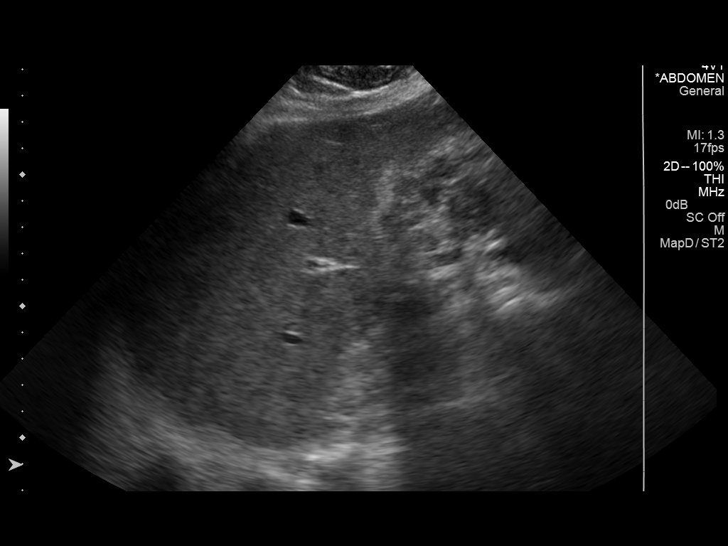
[im 9/21]
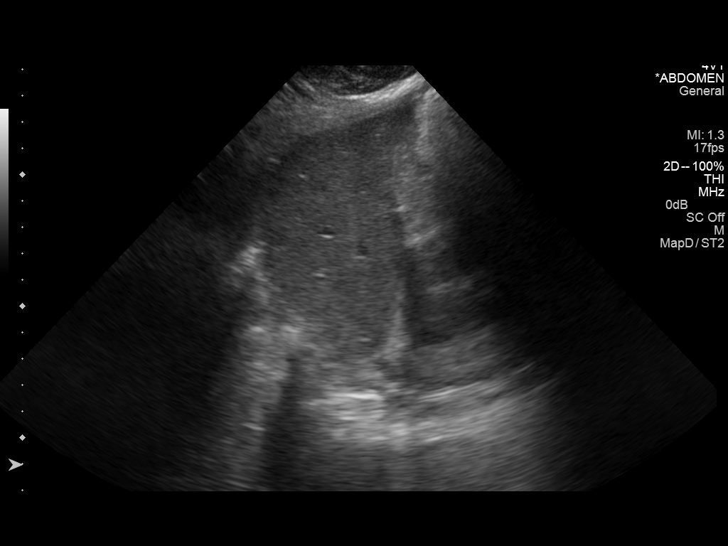
[im 10/21]
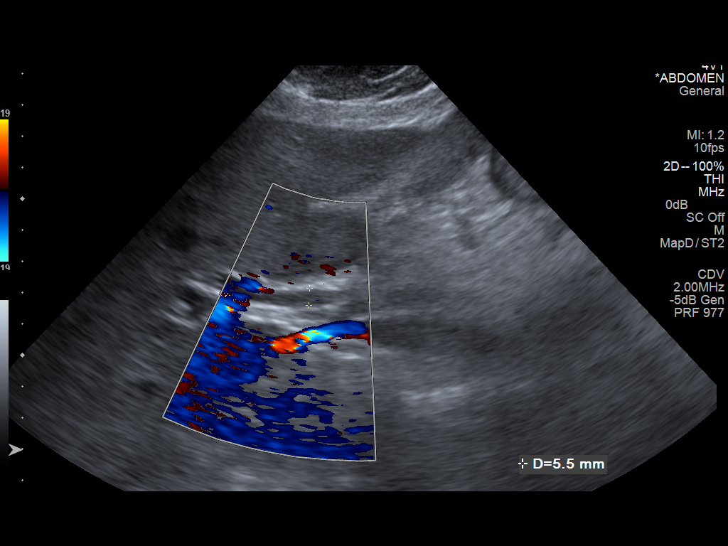
[im 12/21]
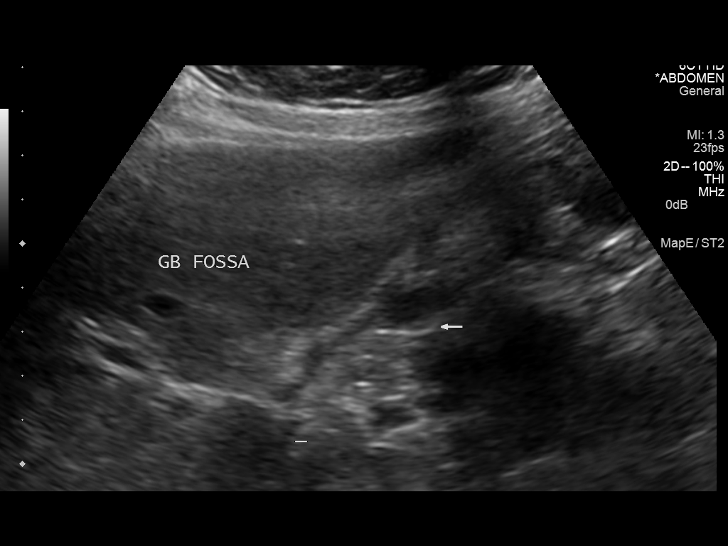
[im 13/21]
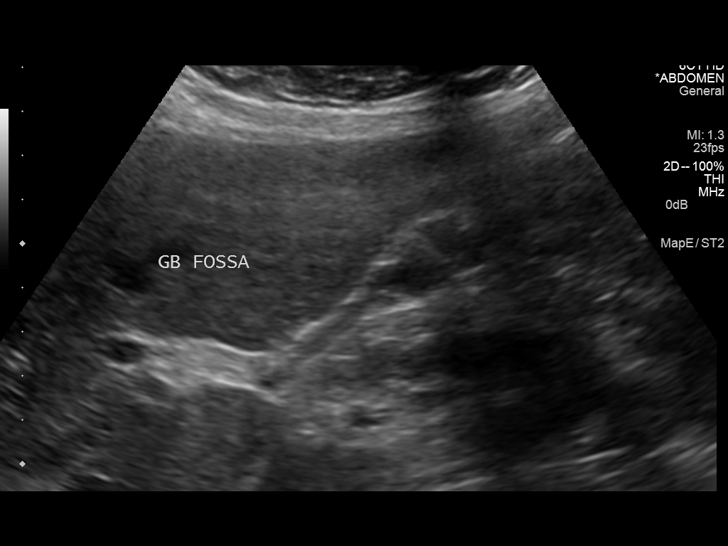
[im 15/21]
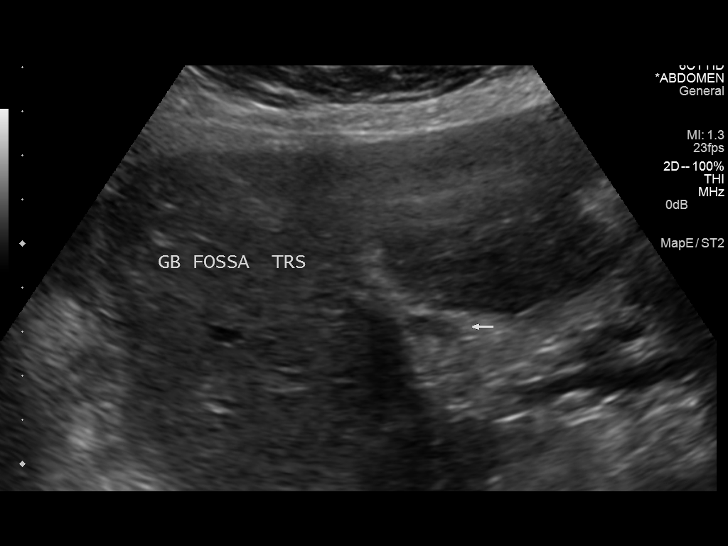
[im 16/21]
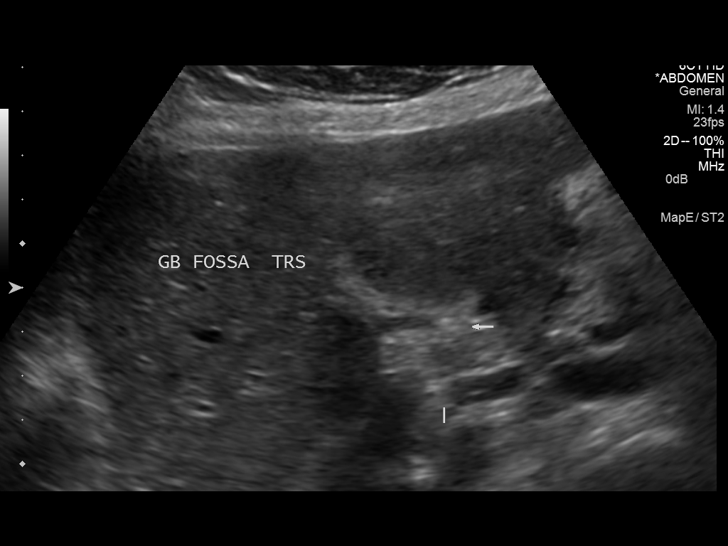
[im 18/21]
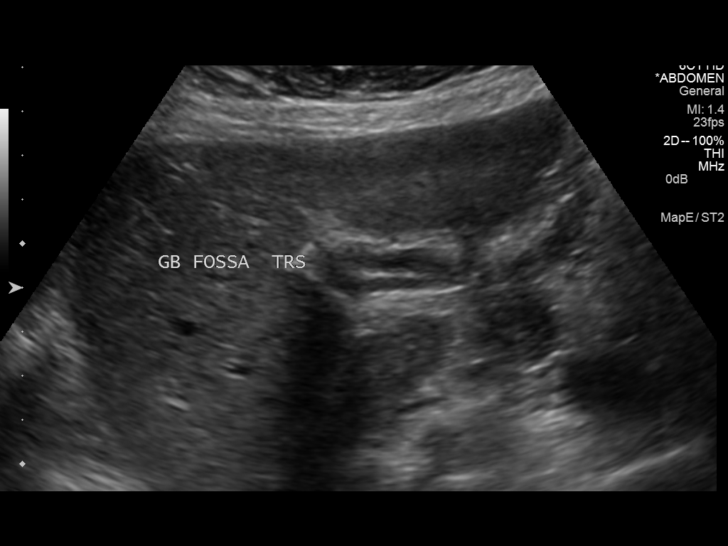
[im 19/21]
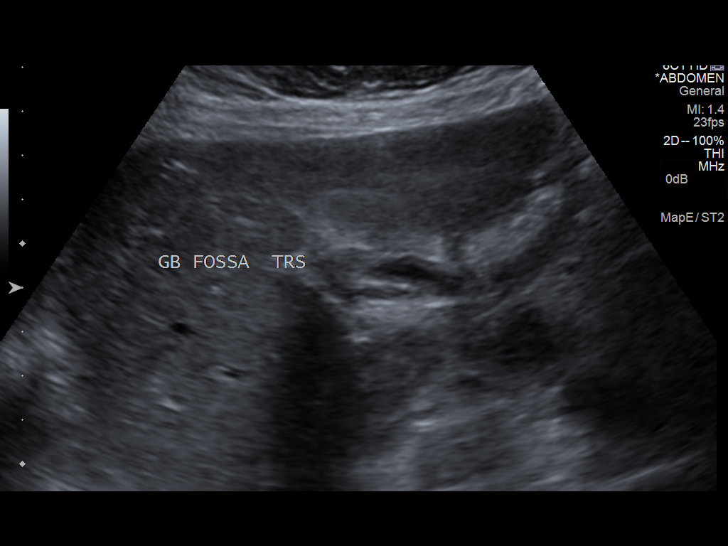
[im 21/21]
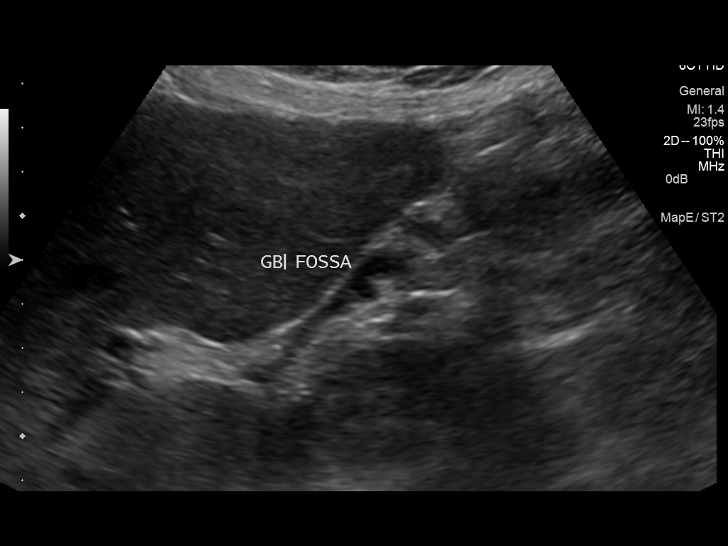

[14 of 21 positions shown; findings below may reference images not displayed]

FINDINGS: Gallbladder:

Not visualized, presumably contracted.

Common bile duct:

Diameter: 6 mm, normal.

Liver:

No focal lesion identified. Within normal limits in parenchymal
echogenicity.
IMPRESSION: 1. Gallbladder not identified, presumably contracted.
2. Negative sonographic appearance of the liver and CBD.

## 2014-12-06 IMAGING — CR DG ABDOMEN ACUTE W/ 1V CHEST
4 series · 4 of 4 positions shown · non-contrast
Comparison: Prior radiograph from 04/02/2013

CLINICAL DATA: Mid chest and lower abdominal pain

EXAM:
ACUTE ABDOMEN SERIES (ABDOMEN 2 VIEW & CHEST 1 VIEW)

[w chest pa]
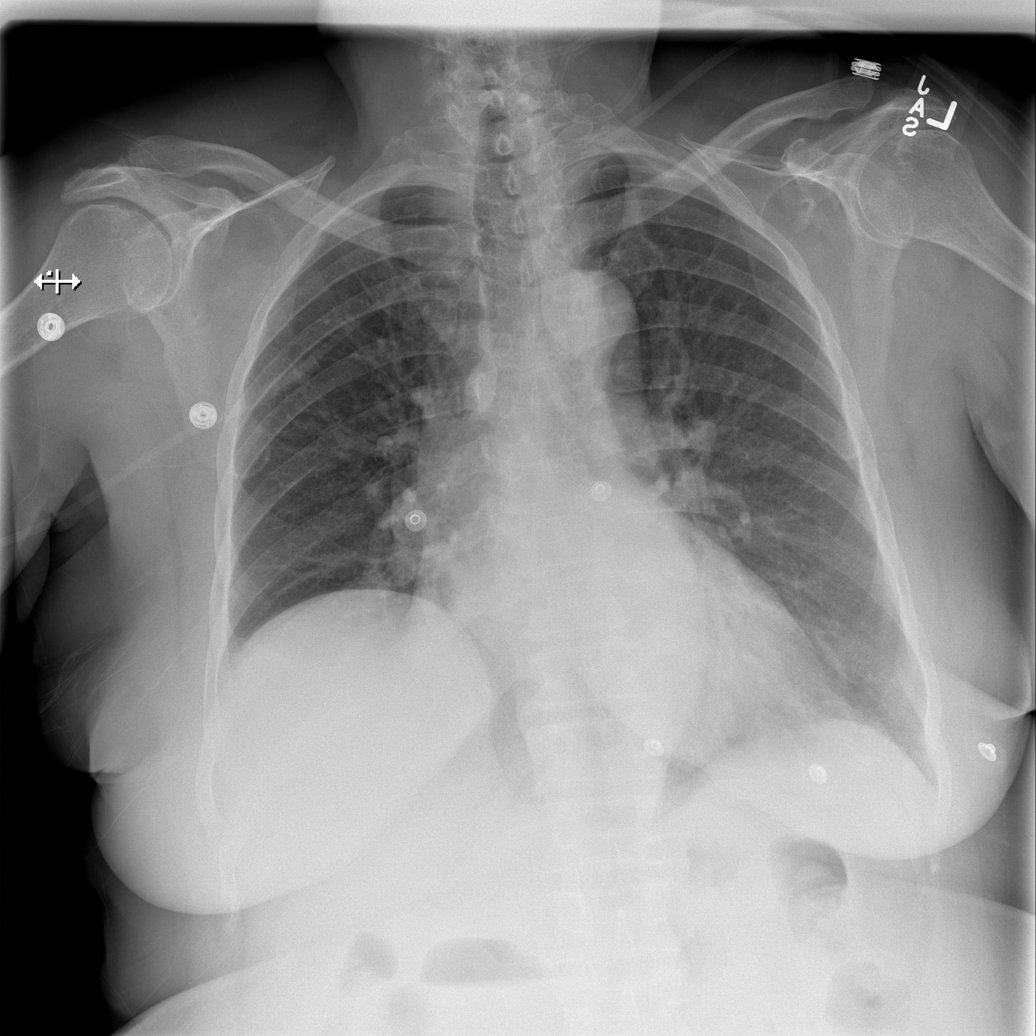

[w abdomen upright]
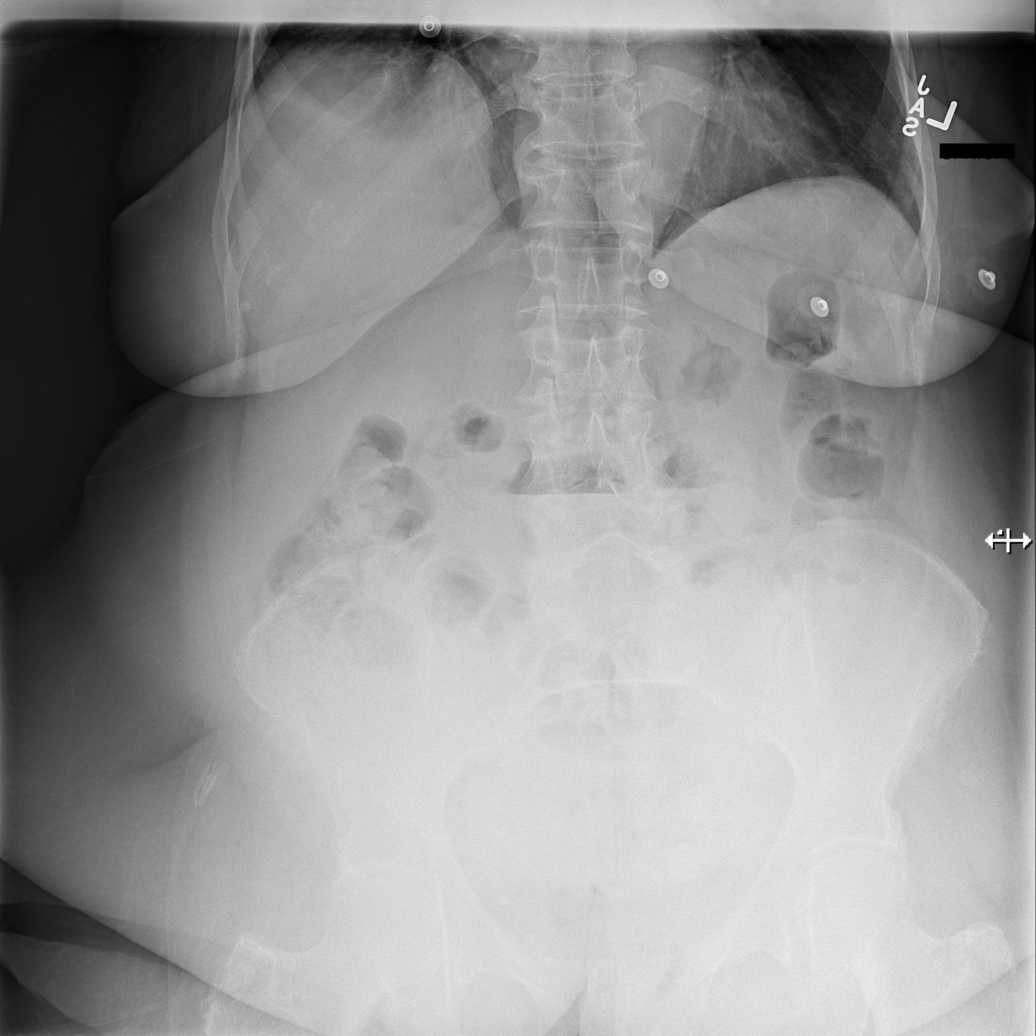

[t abdomen supine (1 of 2)]
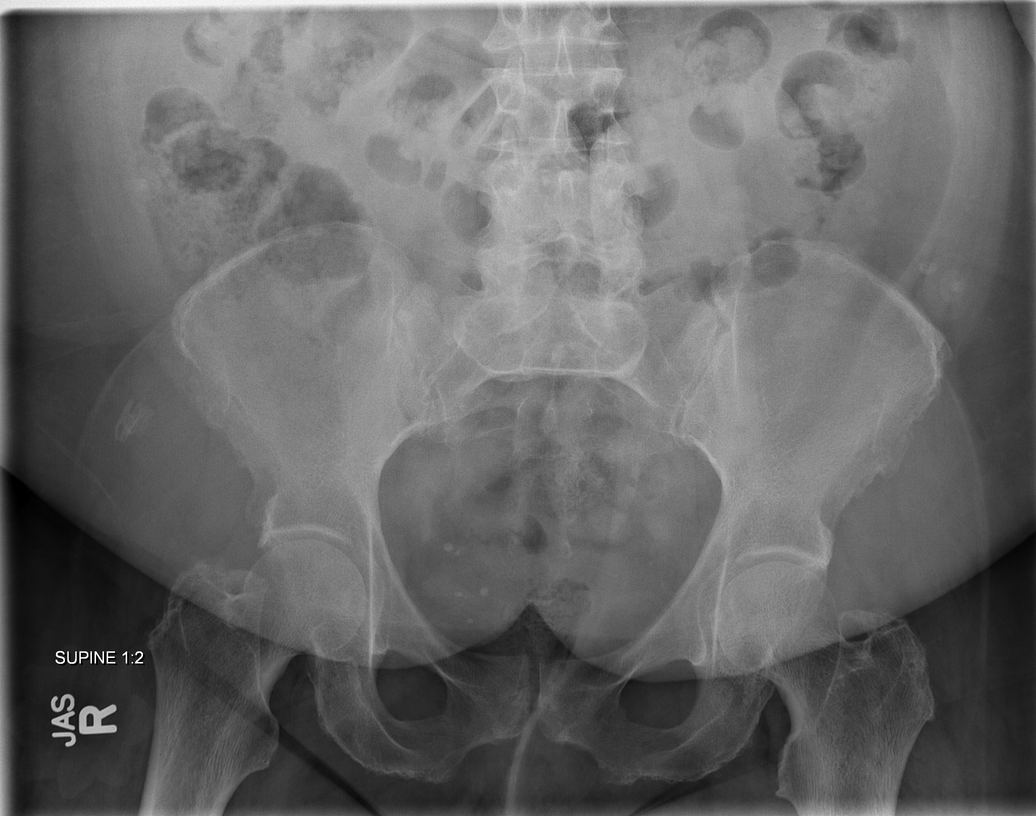

[t abdomen supine (2 of 2)]
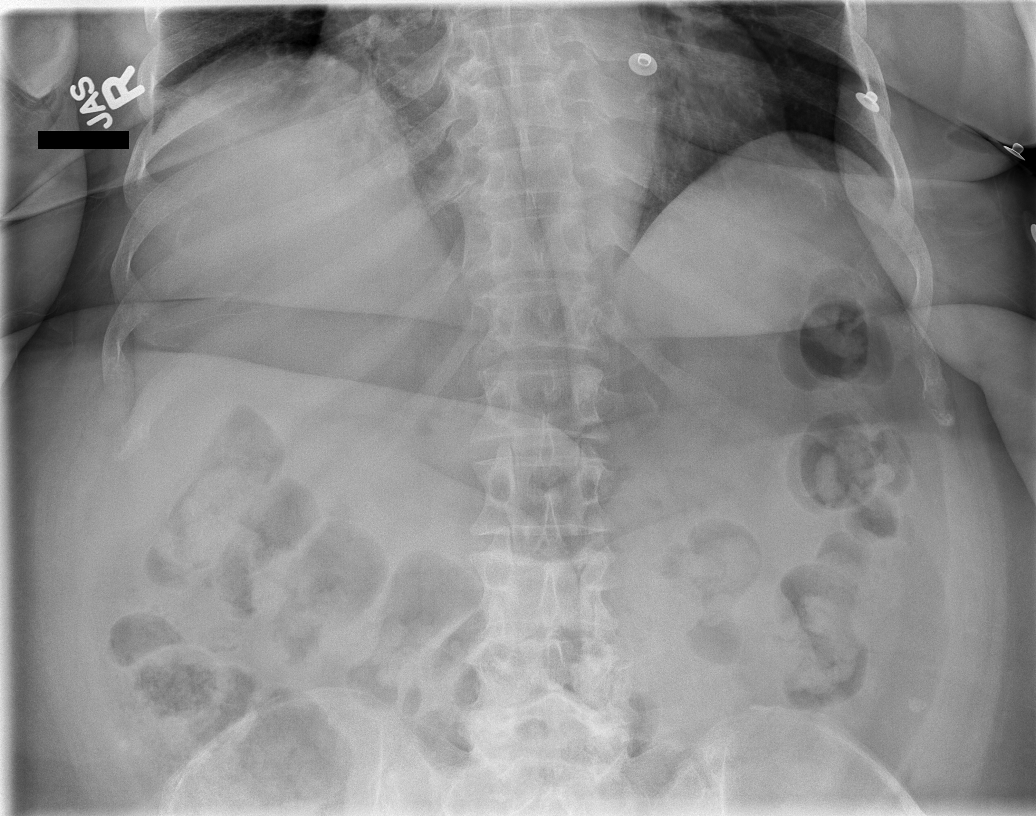

[4 of 4 positions shown; findings below may reference images not displayed]

FINDINGS: Borderline cardiomegaly is stable as compared to prior study.
Mediastinal silhouette within normal limits. Mild tortuosity of the
intrathoracic aorta noted.

Lung volumes within normal limits. There is mild perihilar vascular
congestion without overt pulmonary edema. No focal infiltrate or
pleural effusion. No pneumothorax.

Visualized bowel gas pattern is within normal limits without
evidence of obstruction or ileus. No abnormal bowel wall thickening.
No free intraperitoneal air.

No soft tissue mass or abnormal calcification.

No acute osseus abnormality.
IMPRESSION: 1. Nonobstructive bowel gas pattern with no radiographic evidence
acute intra-abdominal abnormality.
2. Borderline cardiomegaly with mild perihilar vascular congestion
without overt pulmonary edema.

## 2014-12-06 NOTE — Patient Outreach (Signed)
[place] [facility]) Care Management  12/06/2014  AGOSTINA ELIASON 1945-12-11 [id number]   Care coordination:  I made outreach call to [facility] to follow up on whether she heard from Dr. Lina Sar office about dose clarification of hydralazine that was requested on Friday, 12/02/14. Patient reports she has not heard anything from her primary care office regarding hydralazine dose and she has not received prescription for 50mg  dose of hydralazine.  Patient reports she continues to take one-half of a hydralazine 100mg  tablets three times daily.    I made another outreach call to Dr. Lina Sar office.  I was transferred to Dr. Lina Sar nurse Carlyon Shadow but was unable to speak to her directly.  I left a message with Darlene regarding clarification of hydralazine dose and left my telephone number.  If I do not hear from office in the next day, I will send a letter to [name] requesting clarification.  I will follow up with patient once I receive clarification.     [name], Pharm.D. Pharmacy Resident [place] [phone]

## 2014-12-07 ENCOUNTER — Encounter: Payer: Self-pay | Admitting: Pharmacist

## 2014-12-07 ENCOUNTER — Other Ambulatory Visit: Payer: Self-pay | Admitting: Pharmacist

## 2014-12-07 ENCOUNTER — Ambulatory Visit: Payer: Medicare Other

## 2014-12-07 ENCOUNTER — Other Ambulatory Visit: Payer: Self-pay | Admitting: Cardiology

## 2014-12-07 IMAGING — NM NM HEPATOBILIARY IMAGE, INC GB
2 series · 7 of 7 positions shown · non-contrast
Comparison: Abdominal ultrasound September 11, 2013.

CLINICAL DATA: Abdominal pain and possible cholecystitis

EXAM:
NUCLEAR MEDICINE HEPATOBILIARY IMAGING
TECHNIQUE: Sequential images of the abdomen were obtained [DATE] minutes
following intravenous administration of radiopharmaceutical.
RADIOPHARMACEUTICALS:  5 Millicurie Vc-11m Choletec

[he hepatobiliary · 1 of 1 slices shown (1 of 2)]
[im 1/1]
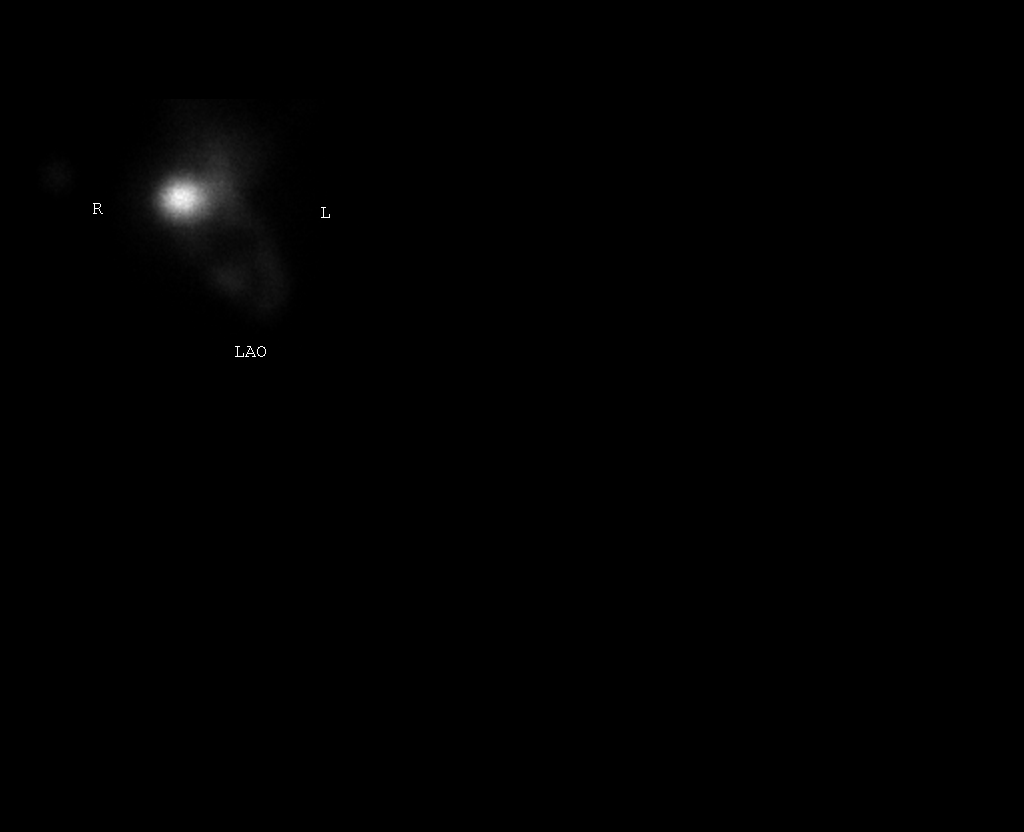

[he hepatobiliary · 3.43mm/px · 6 of 60 frames shown (2 of 2)]
[frame 6/60]
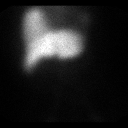
[frame 16/60]
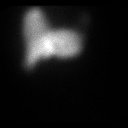
[frame 26/60]
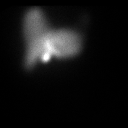
[frame 36/60]
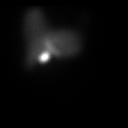
[frame 46/60]
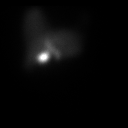
[frame 56/60]
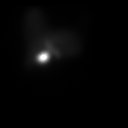

[7 of 7 positions shown; findings below may reference images not displayed]

FINDINGS: There is adequate radiopharmaceutical uptake by the liver. The
common bile duct is visible by 25 min as is the gallbladder. Bowel
activity becomes visible by approximately 90 min.
IMPRESSION: Normal hepatobiliary scan.

Abdominal pain, possible Select proper NMHepatobiliary template.

## 2014-12-07 IMAGING — CT CT HEAD W/O CM
2 series · 16 of 30 positions shown, 18 images · non-contrast
Comparison: 08/18/1998

CLINICAL DATA: Headache, COPD

EXAM:
CT HEAD WITHOUT CONTRAST
TECHNIQUE: Contiguous axial images were obtained from the base of the skull
through the vertex without intravenous contrast.

[Series 201: head w/o, idose (1) · axial · non-contrast · 0.42mm/px · z∈[+62,+177]mm · 8 of 31 slices shown, 10 images]
[im 4/31  brain]
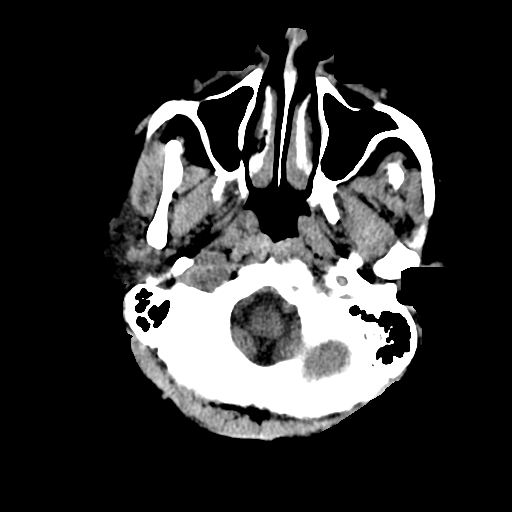
[im 4/31  bone]
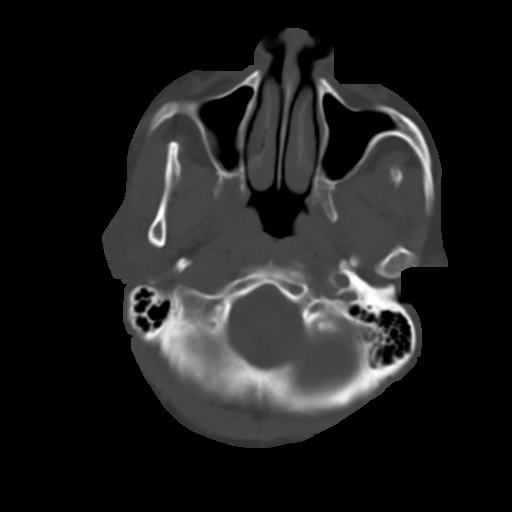
[im 7/31  brain]
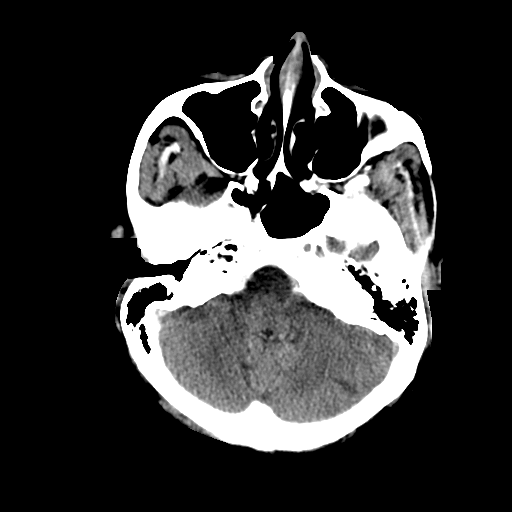
[im 11/31  brain]
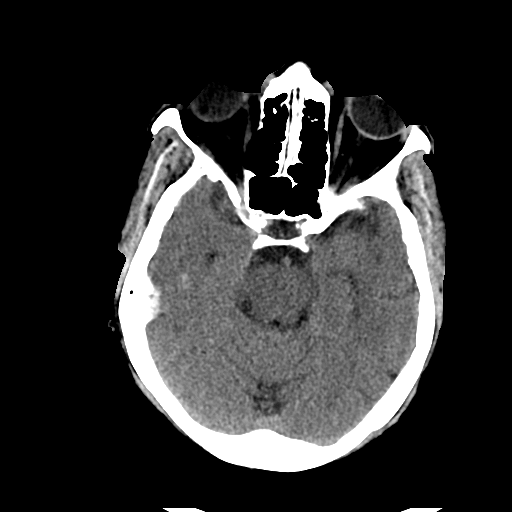
[im 14/31  brain]
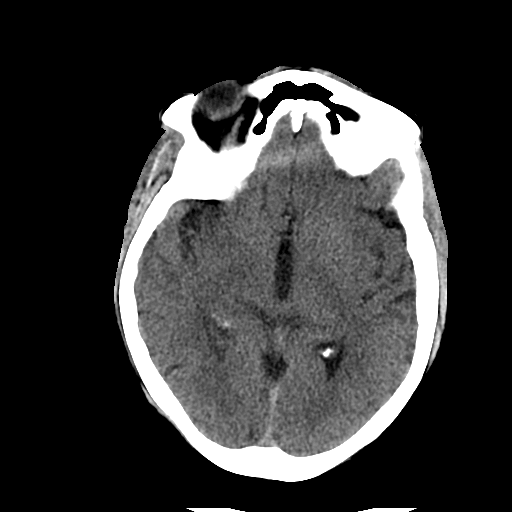
[im 17/31  brain]
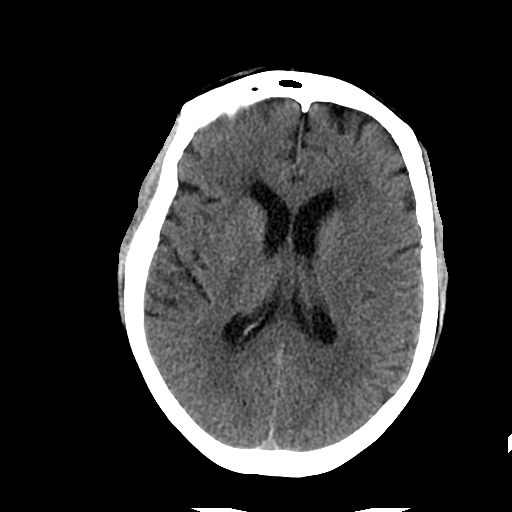
[im 17/31  bone]
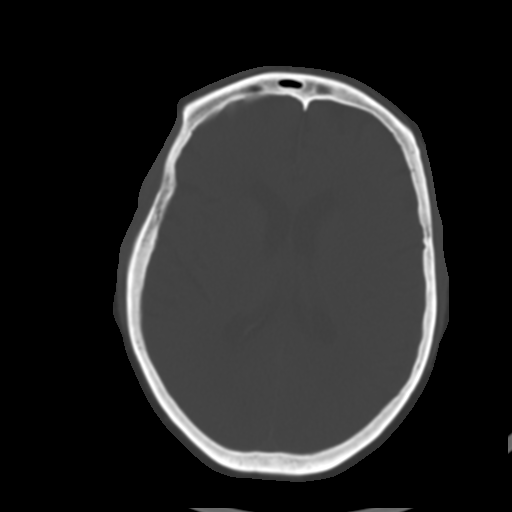
[im 21/31  brain]
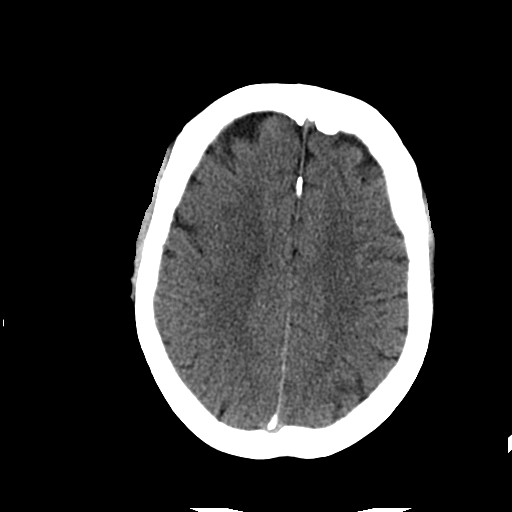
[im 24/31  brain]
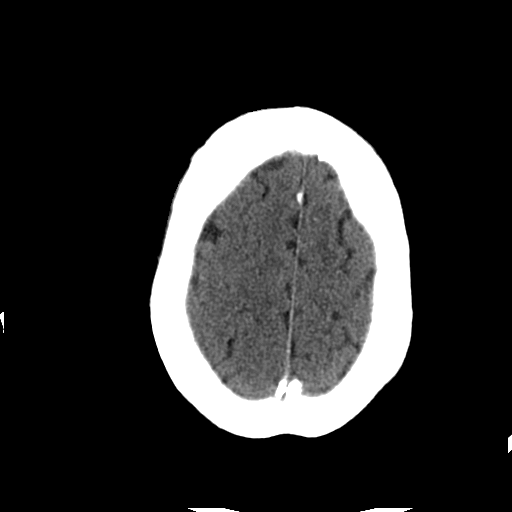
[im 27/31  brain]
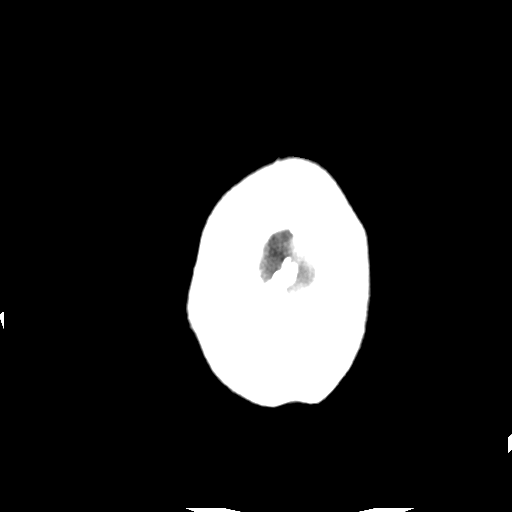

[Series 202: head w/o bone, idose (1) · axial · non-contrast · 0.42mm/px · z∈[+61,+181]mm · 8 of 62 slices shown]
[im 7/62  bone]
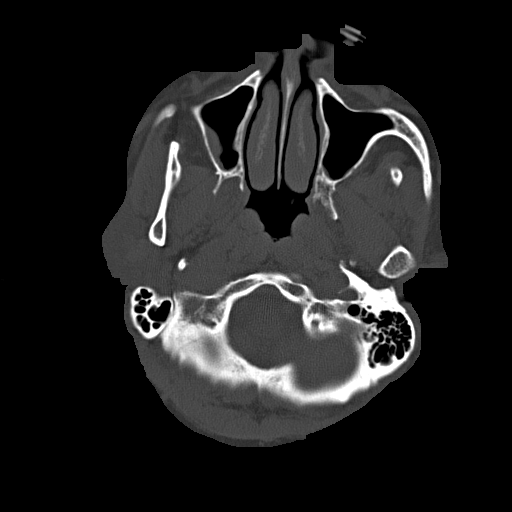
[im 13/62  bone]
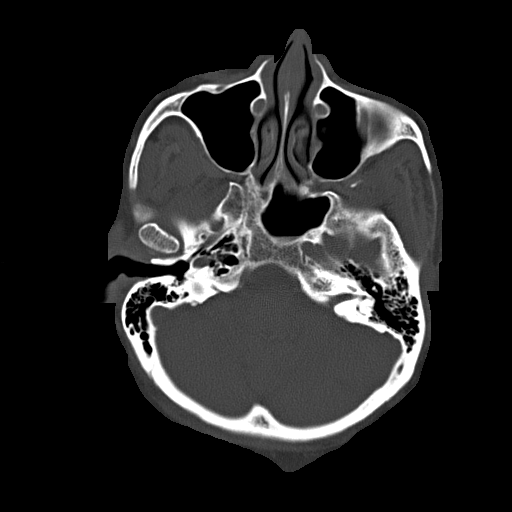
[im 20/62  bone]
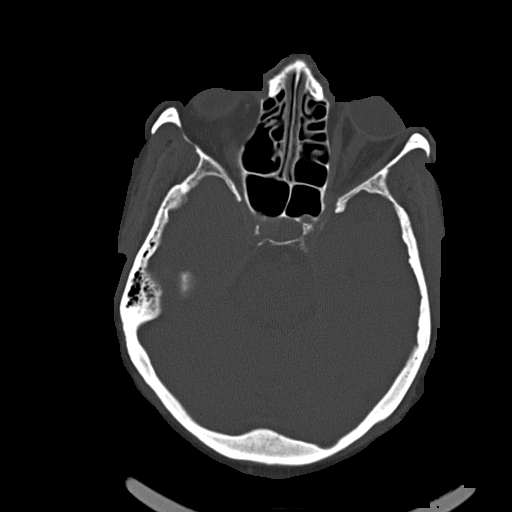
[im 26/62  bone]
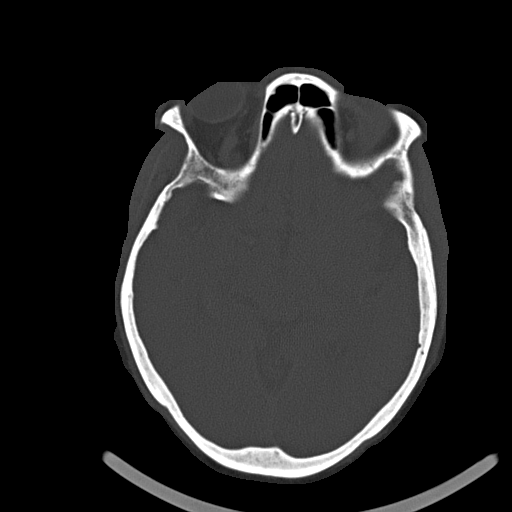
[im 36/62  bone]
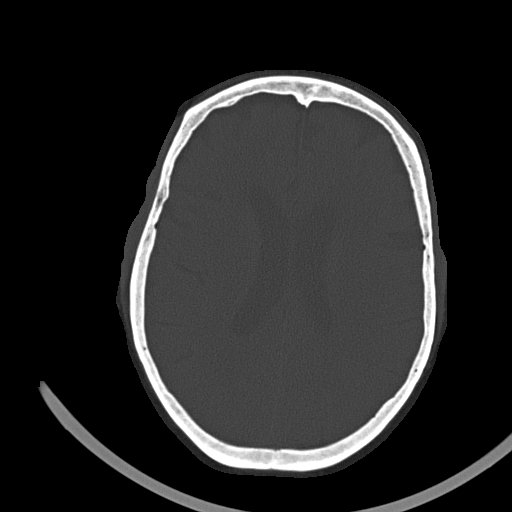
[im 42/62  bone]
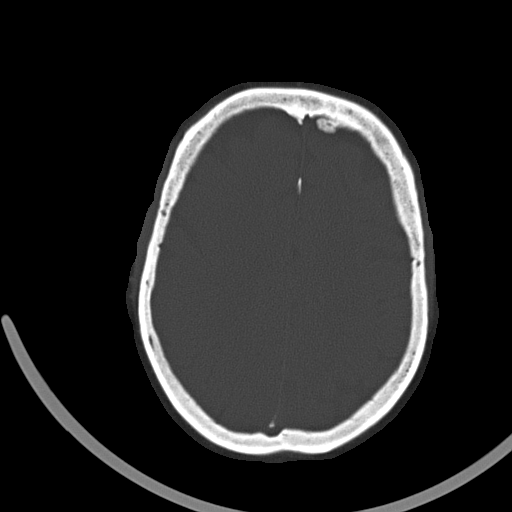
[im 49/62  bone]
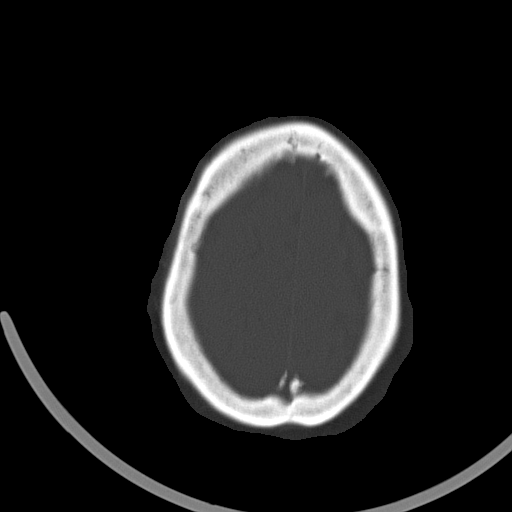
[im 55/62  bone]
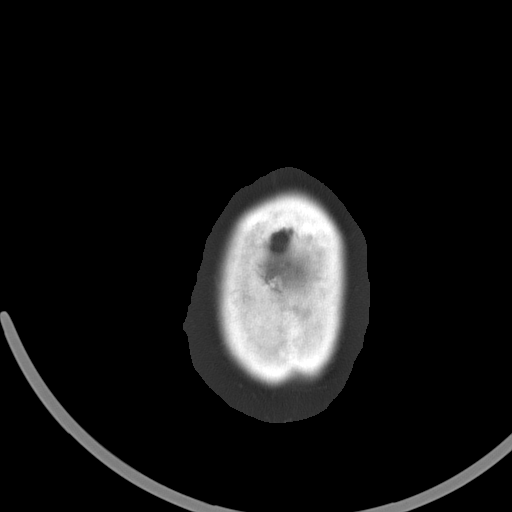

[16 of 30 positions shown; findings below may reference images not displayed]

FINDINGS: No skull fracture is noted. Paranasal sinuses and mastoid air cells
are unremarkable. No intracranial hemorrhage, mass effect or midline
shift. Mild cerebral atrophy. No acute cortical infarction. No mass
lesion is noted on this unenhanced scan. Periventricular white
matter decreased attenuation probable due to chronic small vessel
ischemic changes.
IMPRESSION: No acute intracranial abnormality. Mild cerebral atrophy.
Periventricular white matter decreased attenuation probable due to
chronic small vessel ischemic changes.

## 2014-12-07 NOTE — Patient Outreach (Signed)
[place] [facility]) Care Management  12/07/2014  JULYE GORRA 1945-11-02 [id number]   Care coordination:  I received a return call from [place] at Dr. Lina Sar office who confirms that patient is supposed to still be taking hydralazine 100mg  TID.  Darlene reports the hydralazine dose was NOT reduced at last visit.  Darlene reports there were no medication changes at the office visit on 11/30/14.  Also noted that patient's furosemide dose on medication list at primary care office is furosemide 80 mg - one half tablet twice daily.  Updated Darlene that patient is currently taking furosemide 80mg  twice daily as prescribed by her nephrologist.    I called patient to inform her that hydralazine dose was confirmed as 100mg  TID.  Patient was sleeping and I spoke to patient's caregiver [name].  [name] reports understanding and will let patient know to start taking hydralazine 100mg  TID.  Hydralazine was not placed in patient's pill box at home visit on 12/01/14.  Encouraged [name] to place hydralazine 100mg  in pill box in the morning, mid-day, and bedtime slots.  [name] asked if any of patients evening medications would be making her sleepy.  Counseled [name] that patient is prescribed cyclobenzaprine and tramadol which may cause drowsiness.  [name] reports they were also out running errands all day which may contribute to fatigue.  [name] also mentioned that patient almost had a fall coming up the steps to the home after errands.  She was not using her cane at the time.  Encouraged patient to use cane.  Will alert [facility] CMRN about patient's near fall as she has visit with patient scheduled for 12/08/14.    Plan: Home visit on 12/13/14 as scheduled.    [name], Pharm.D. Pharmacy Resident [place] [phone]

## 2014-12-07 NOTE — Patient Outreach (Signed)
[place] [facility]) Care Management  12/07/2014  Joann Gutierrez 10/08/1945 [id number]   I received incoming phone call from patient's caregiver Clint Guy.  Joann Gutierrez asked if anyone from [facility] was coming out to the home today.  Joann Gutierrez denied any concerns and states he just wanted to make sure he had the correct dates for the appointments on the calendar.  Reminded patient of appointment with Loni Muse on 12/08/14 at 1:00 PM and appointment with me on 12/13/14 at 10:00 AM.    Joann Gutierrez reports they still have not heard regarding dose clarification of hydralazine from primary care office.  Joann Gutierrez reports he is going to try to call the office today.  Requested for Joann Gutierrez to update me if he receives clarification on hydralazine dose.     [name], Pharm.D. Pharmacy Resident [place] [phone]

## 2014-12-08 ENCOUNTER — Other Ambulatory Visit: Payer: Self-pay

## 2014-12-08 IMAGING — CR DG ABD PORTABLE 1V
1 series · 1 of 1 positions shown · non-contrast
Comparison: CT scan 09/11/2013.

CLINICAL DATA: Abdominal pain.

EXAM:
PORTABLE ABDOMEN - 1 VIEW

[AP]
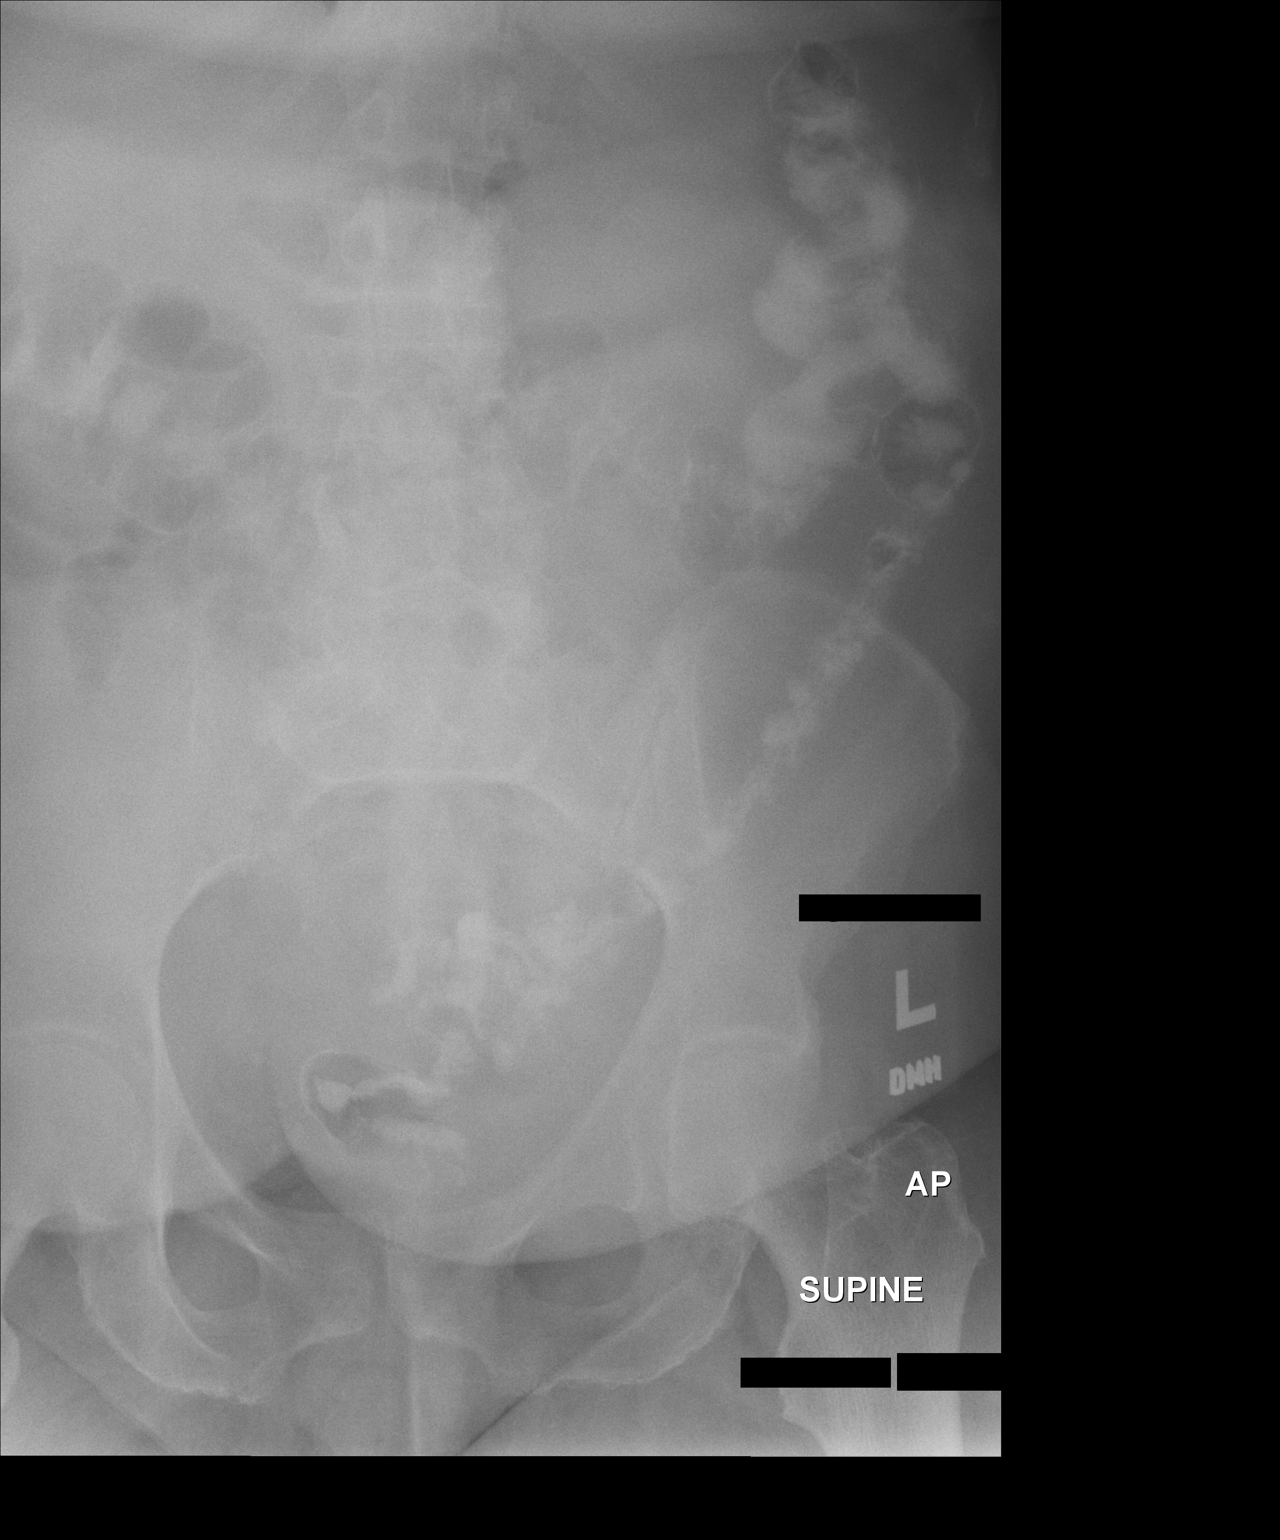

[1 of 1 positions shown; findings below may reference images not displayed]

FINDINGS: There is some persistent scattered contrast throughout the colon
from the recent CT scan. No findings for small bowel obstruction or
obvious free air but examination is limited by body habitus.
IMPRESSION: No findings for small bowel obstruction.

## 2014-12-08 NOTE — Telephone Encounter (Signed)
REFILL 

## 2014-12-09 ENCOUNTER — Encounter (HOSPITAL_COMMUNITY)
Admission: RE | Admit: 2014-12-09 | Discharge: 2014-12-09 | Disposition: A | Discharge status: Home / Self Care | Payer: Medicare Other | Source: Ambulatory Visit | Attending: Nephrology | Admitting: Nephrology

## 2014-12-09 ENCOUNTER — Other Ambulatory Visit: Payer: Self-pay

## 2014-12-09 DIAGNOSIS — N183 Chronic kidney disease, stage 3 (moderate): Secondary | ICD-10-CM | POA: Diagnosis not present

## 2014-12-09 DIAGNOSIS — D631 Anemia in chronic kidney disease: Secondary | ICD-10-CM | POA: Diagnosis not present

## 2014-12-09 LAB — POCT HEMOGLOBIN-HEMACUE: HEMOGLOBIN: 11.5 g/dL — AB (ref 12.0–15.0)

## 2014-12-09 MED ORDER — EPOETIN ALFA 20000 UNIT/ML IJ SOLN: 20000.0000 [IU] | INTRAMUSCULAR | Status: DC

## 2014-12-10 IMAGING — US US ABDOMEN LIMITED
1 series · 14 of 25 positions shown · non-contrast
Comparison: CT 09/11/2013.  Ultrasound the same day.

CLINICAL DATA: Re-evaluate gallbladder.

EXAM:
US ABDOMEN LIMITED - RIGHT UPPER QUADRANT

[Series 1: us abdomen limited · 0.27mm/px · 14 of 36 slices shown]
[im 1/36]
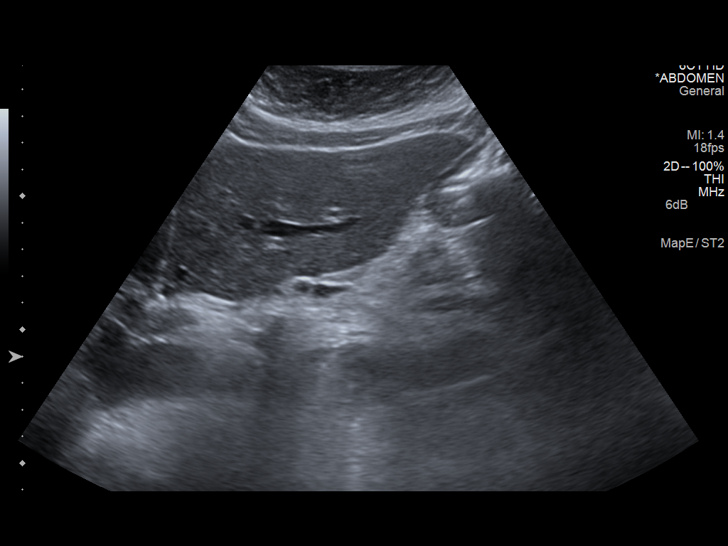
[im 3/36]
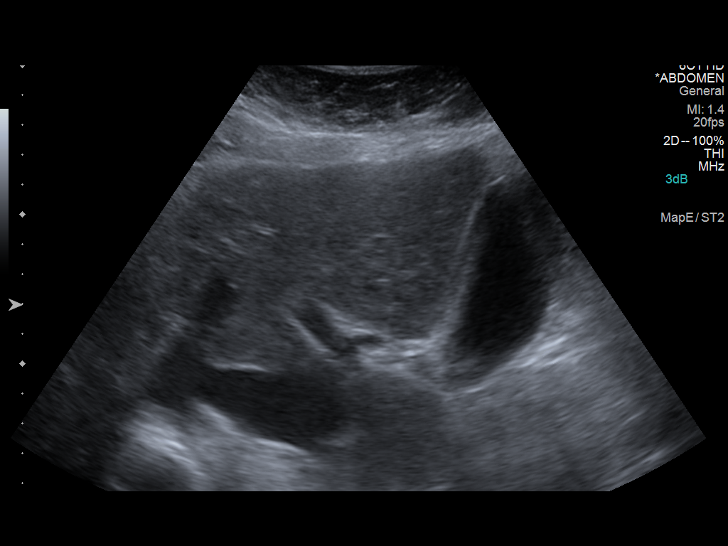
[im 6/36]
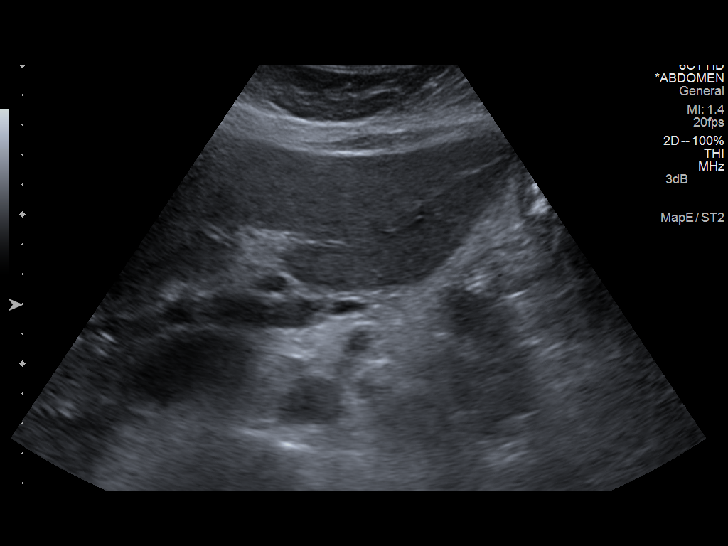
[im 9/36]
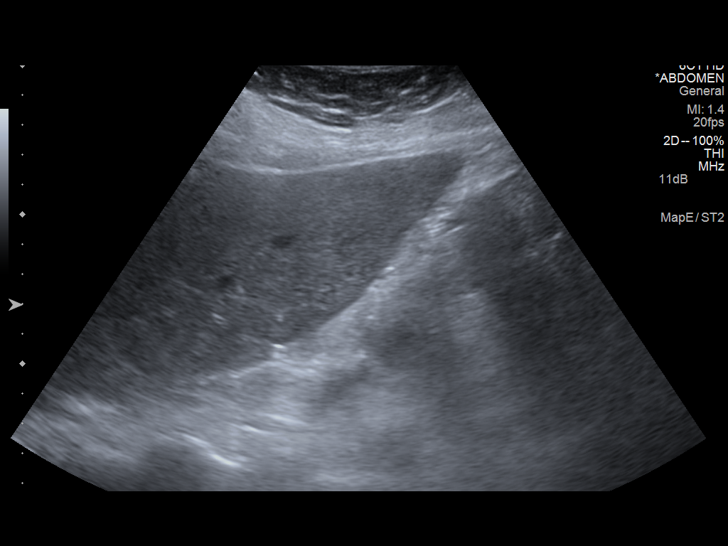
[im 12/36]
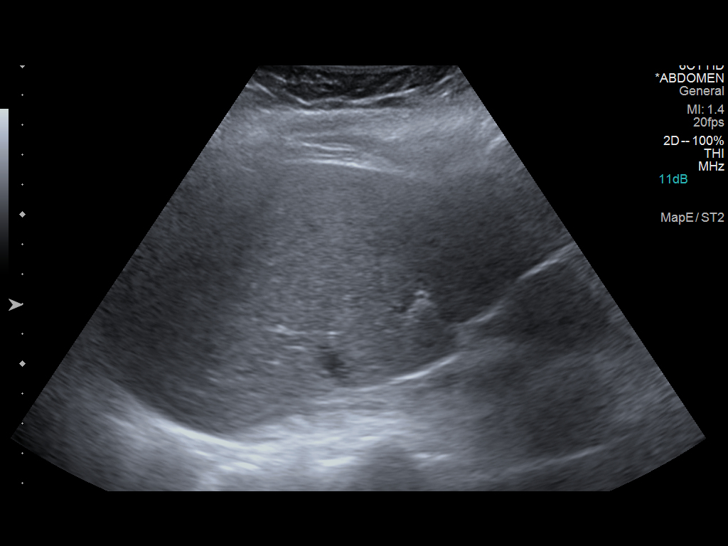
[im 14/36]
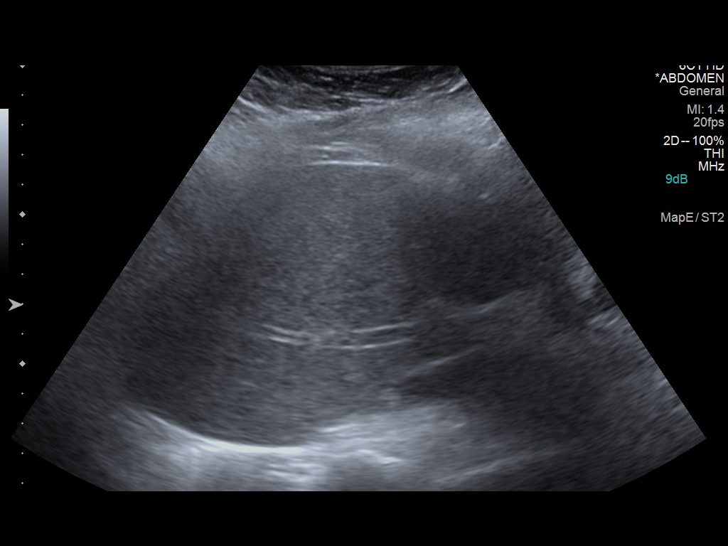
[im 17/36]
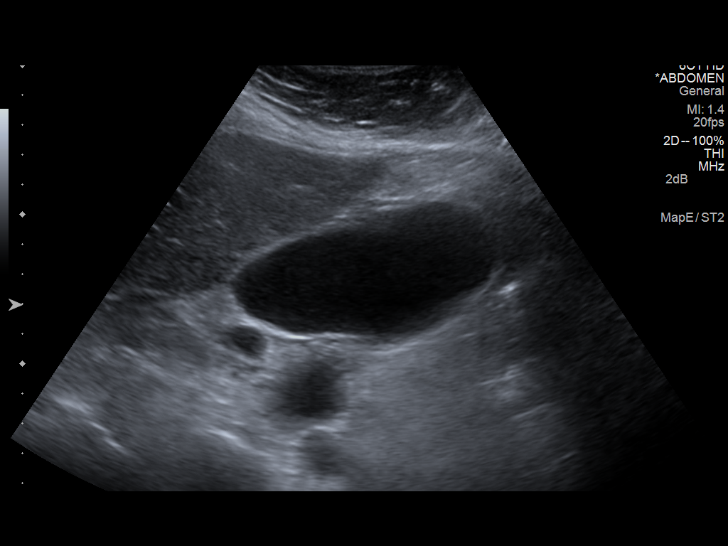
[im 19/36]
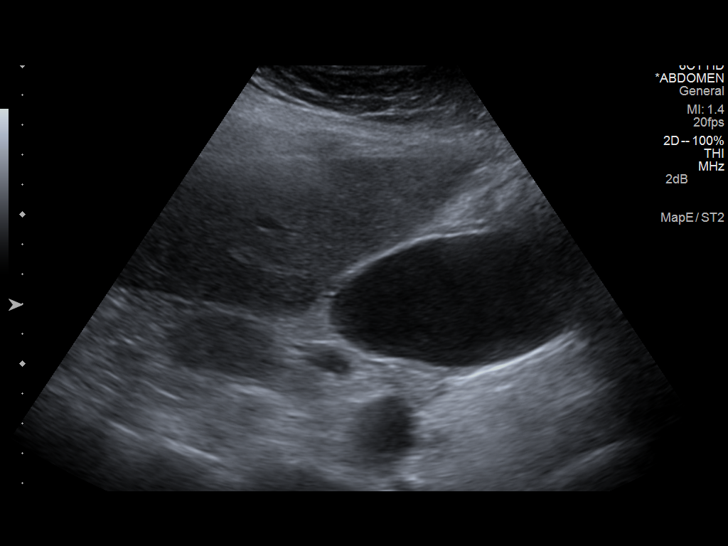
[im 22/36]
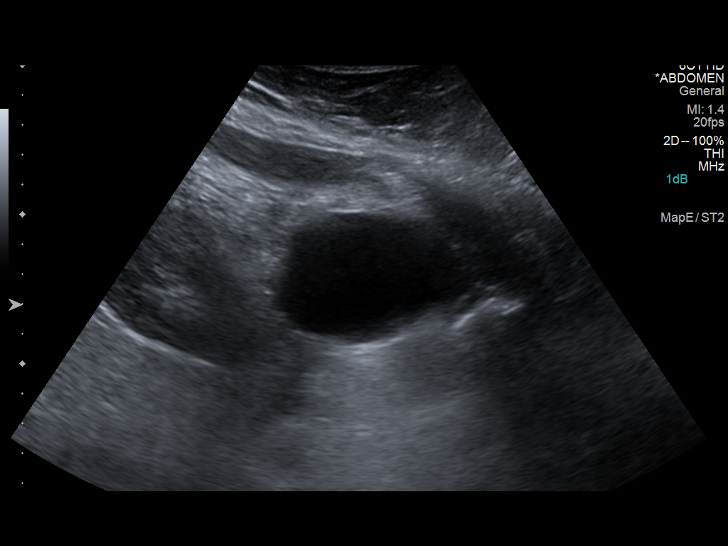
[im 24/36]
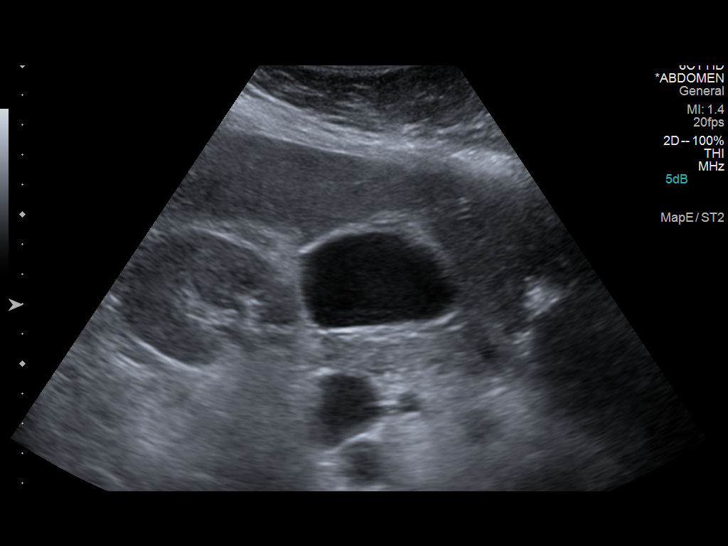
[im 27/36]
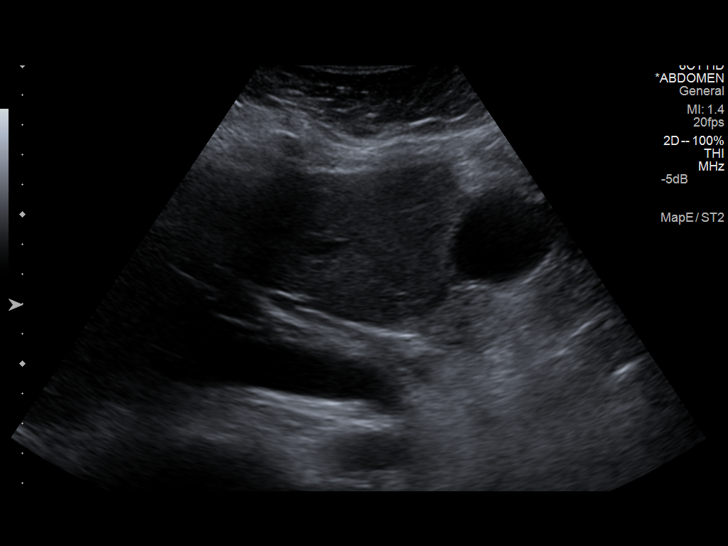
[im 30/36]
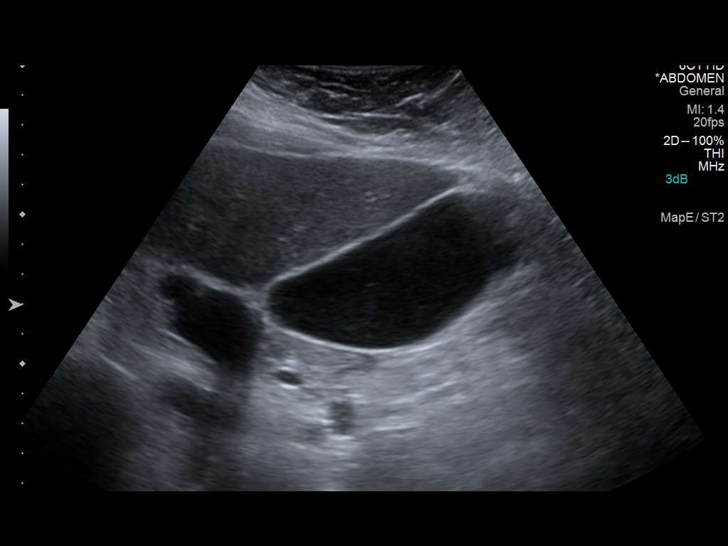
[im 33/36]
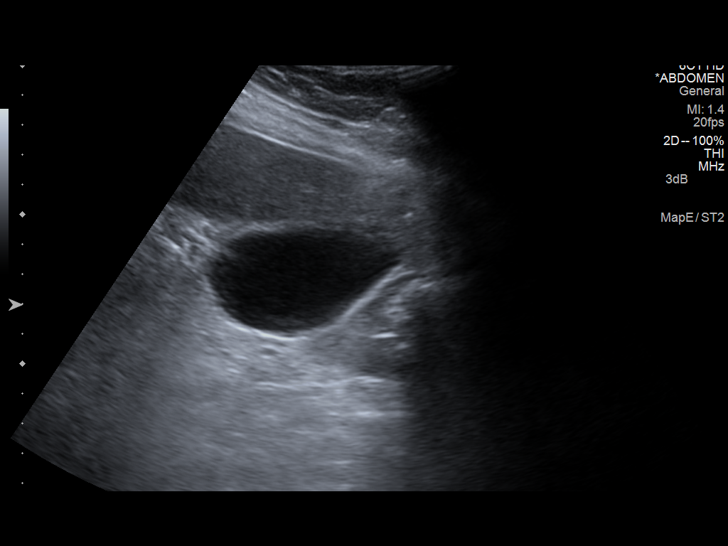
[im 36/36]
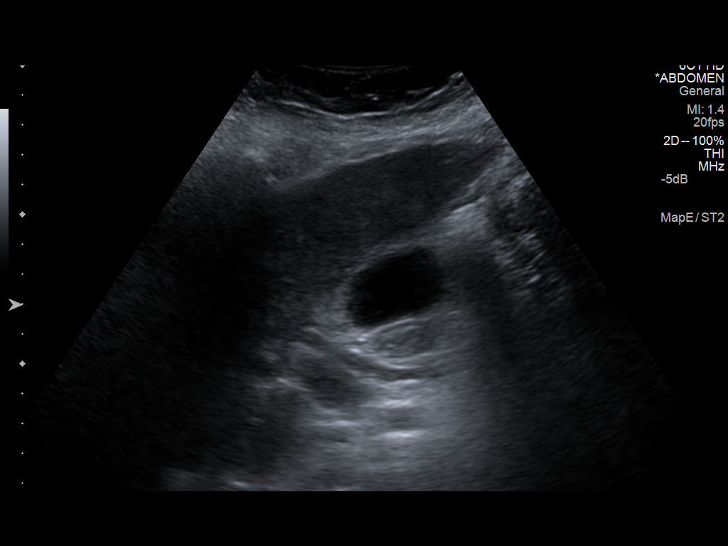

[14 of 25 positions shown; findings below may reference images not displayed]

FINDINGS: Gallbladder:

No gallstones or wall thickening visualized. No sonographic Murphy
sign noted.

Common bile duct:

Diameter: Normal caliber, 5 mm.

Liver:

No focal lesion identified. Within normal limits in parenchymal
echogenicity.
IMPRESSION: Gallbladder appears unremarkable on today's study. No evidence of
cholelithiasis or acute cholecystitis.

## 2014-12-13 ENCOUNTER — Other Ambulatory Visit: Payer: Self-pay | Admitting: Pharmacist

## 2014-12-13 ENCOUNTER — Ambulatory Visit: Payer: Medicare Other | Admitting: Pharmacist

## 2014-12-13 NOTE — Patient Outreach (Signed)
[facility]) Care Management  12/13/2014  SHEILLA WEISBROT 28-May-1945 [id number]   Anwitha Hanlan is a 70yo who I am following for medication adherence.  I had home visit scheduled with patient today at 10:00 AM.  I received a voicemail from patient's caregiver, Clint Guy, this morning stating that patient wishes to cancel today's appointment and requested a phone call to reschedule appointment.  I made outreach call to [name] to discuss appointment.  I reminded [name] that the purpose of the visit was to refill patient's pill box (I last filled two weeks of pill boxes on 12/01/14).  I offered to schedule appointment for tomorrow; however, [name] reports they are busy getting ready for Thanksgiving and requests to reschedule appointment for the week after Thanksgiving.  [name] reports he feels comfortable filling the patient's pill box.  [name] has filled patient's pill box in the past with some coaching to ensure pill box was filled correctly.  Reminded [name] of importance of only placing metolazone in pill box on Monday and Friday and to follow the directions on the bottles to ensure medications are placed in the box correctly.  [name] voices understanding.  Reminded [name] of hydralazine dosing instructions of 100mg  TID, and [name] confirms that is how patient has been taking the medication.  Appointment rescheduled for 12/19/14 at 11:00AM.     [name], Pharm.D. Pharmacy Resident [place] [phone]

## 2014-12-16 ENCOUNTER — Encounter (HOSPITAL_COMMUNITY): Payer: Medicare Other

## 2014-12-19 ENCOUNTER — Other Ambulatory Visit: Payer: Self-pay | Admitting: Pharmacist

## 2014-12-19 NOTE — Patient Outreach (Signed)
[facility]) Care Management  [place]   12/19/2014  Joann Gutierrez [birth year] [id number]  Subjective: Joann Gutierrez is a 69yo who I am following for medication adherence.  I made follow up home visit today to review compliance and assess pill boxes filled by patient's caregiver.  Visit was attended by Joann Gutierrez.  Patient's caregiver Joann Gutierrez was also present for part of the visit.   Patient reports feeling sleepy today.  Patient had one full pill box that patient's caregiver states he filled yesterday.  I reviewed pill box.  Pill box was filled appropriately and was only missing hydralazine 135m TID.  Patient reports she had that medication in her purse and that is why it was not in the pill box.  Patient got medication bottle out of her purse and hydralazine 1034mTID was added to the pill box.  Patient had not taken any medications from the full pill box.  When asked if she had taken her medications this morning, patient reports she took them out of the bottles instead of the pill box, but was unable to verbalize a reason as to why she did not take them out of the pill box.    Patient's second pill box had not been refilled before I arrived.  It appears patient was approximately 50% compliant with her medications last week.  She had missed 2 morning doses, 3 mid-day doses, 4 evening doses, and 4 bedtime doses.  I refilled the second weekly pill box.    Patient had moved her medications since my last visit and had medication bottles several places in her living room.  Assisted patient in organizing all of her medication bottles in one large box.  Patient had several expired over the counter medications.  Disposed of expired over the counter medications with patient's permission.   Counseled patient on purpose, proper use, and adverse effects of several over the counter medications she had at her home including cold medications with  phenylephrine.  Patient reports she has not been taking any of the over the counter cold medications.  Counseled patient to avoid phenylephrine due to its effect on blood pressure.  Reviewed cold medications that are recommended for patient's with high blood pressure (Coricidin HBP).    Objective:  Blood pressure: 147/78  Current Medications: Current Outpatient Prescriptions  Medication Sig Dispense Refill  . albuterol (VENTOLIN HFA) 108 (90 BASE) MCG/ACT inhaler Inhale 2 puffs into the lungs every 6 (six) hours as needed for shortness of breath. 1 Inhaler 11  . aspirin 325 MG tablet Take 325 mg by mouth daily.     . Marland Kitchenspirin 81 MG tablet Take 81 mg by mouth daily.    . Marland Kitchentorvastatin (LIPITOR) 40 MG tablet Take 1 tablet (40 mg total) by mouth daily. (Patient taking differently: Take 40 mg by mouth at bedtime. ) 30 tablet 6  . Calcium Carbonate-Vit D-Min (CALTRATE PLUS PO) Take 600 mg by mouth every morning.     . Marland Kitchenod Liver Oil CAPS Take 1 capsule by mouth daily.    . Marland Kitchenonepezil (ARICEPT) 10 MG tablet Take 10 mg by mouth at bedtime.     . Marland Kitchenoxazosin (CARDURA) 2 MG tablet Take 1 tablet by mouth at bedtime.    . Marland Kitchenzetimibe (ZETIA) 10 MG tablet Take 10 mg by mouth at bedtime.     . ferrous sulfate 325 (65 FE) MG EC tablet Take 325 mg by mouth every morning.    .Marland Kitchen  fluticasone (FLONASE) 50 MCG/ACT nasal spray Place 2 sprays into both nostrils daily. 16 g 11  . furosemide (LASIX) 80 MG tablet Take 1 tablet by mouth 2 (two) times daily.    Marland Kitchen gabapentin (NEURONTIN) 300 MG capsule Take 1 capsule (300 mg total) by mouth 2 (two) times daily. 60 capsule 5  . hydrALAZINE (APRESOLINE) 100 MG tablet Take 100 mg by mouth 3 (three) times daily.     . insulin glargine (LANTUS) 100 UNIT/ML injection Inject 0.15 mLs (15 Units total) into the skin daily. (Patient taking differently: Inject 30 Units into the skin daily. ) 10 mL 11  . insulin NPH (HUMULIN N) 100 UNIT/ML injection Inject 2-13 Units into the skin 3 (three)  times daily after meals. Sliding scale with each meal    . isosorbide mononitrate (IMDUR) 120 MG 24 hr tablet TAKE ONE (1) TABLET BY MOUTH EVERY DAY 30 tablet 0  . KLOR-CON M20 20 MEQ tablet Take 1 tablet by mouth daily.     Marland Kitchen LIDODERM 5 % Apply patch to area for 12 hours as needed for pain    . losartan (COZAAR) 100 MG tablet Take 1 tablet by mouth every morning.     . metoCLOPramide (REGLAN) 10 MG tablet TAKE ONE TABLET BEFORE MEALS AND AT BEDTIME 120 tablet 2  . metolazone (ZAROXOLYN) 2.5 MG tablet Take 2.5 mg by mouth 3 (three) times a week. Take 1 tablet by mouth on Monday and Friday    . montelukast (SINGULAIR) 10 MG tablet Take 1 tablet (10 mg total) by mouth daily. 30 tablet 11  . Multiple Vitamins-Minerals (MULTIVITAMIN GUMMIES ADULT) CHEW Chew 1 capsule by mouth daily.    . nitroGLYCERIN (NITROSTAT) 0.4 MG SL tablet Place 1 tablet (0.4 mg total) under the tongue every 5 (five) minutes as needed for chest pain. 25 tablet 12  . Omega-3 Fatty Acids (FISH OIL) 1000 MG CAPS Take 1 capsule by mouth daily.    Marland Kitchen omeprazole (PRILOSEC) 40 MG capsule Take one po daily 90 capsule 3  . OXYGEN Inhale into the lungs. CPAP with oxygen at bedtime    . raloxifene (EVISTA) 60 MG tablet Take 60 mg by mouth every morning.     . sucralfate (CARAFATE) 1 GM/10ML suspension Take 10 mLs (1 g total) by mouth 2 (two) times daily. 1260 mL 3  . SYMBICORT 160-4.5 MCG/ACT inhaler INHALE 2 PUFFS INTO LUNGS 2 TIMES DAILY 10.2 g 5  . traMADol (ULTRAM) 50 MG tablet Take 50 mg by mouth every 6 (six) hours as needed.    . Vitamin D, Ergocalciferol, (DRISDOL) 50000 UNITS CAPS capsule Take 50,000 Units by mouth every Monday.     . cyclobenzaprine (FLEXERIL) 10 MG tablet Take 10 mg by mouth at bedtime.     Marland Kitchen dextromethorphan-guaiFENesin (ROBITUSSIN-DM) 10-100 MG/5ML liquid Take 10 mLs by mouth every 4 (four) hours as needed for cough.     No current facility-administered medications for this visit.   Functional Status: In  your present state of health, do you have any difficulty performing the following activities: 12/09/2014 12/08/2014  Hearing? N -  Vision? N -  Difficulty concentrating or making decisions? N -  Walking or climbing stairs? Y -  Dressing or bathing? N -  Doing errands, shopping? - Facilities manager and eating ? - Y  Using the Toilet? - N  In the past six months, have you accidently leaked urine? - N  Do you have problems with loss of  bowel control? - N  Managing your Medications? - Y  Managing your Finances? - Y  Housekeeping or managing your Housekeeping? - Y   Fall/Depression Screening: PHQ 2/9 Scores 12/08/2014 11/09/2014 10/03/2014 04/07/2013  PHQ - 2 Score '1 4 6 2  ' PHQ- 9 Score - '12 12 14  ' Exception Documentation - - Other- indicate reason in comment box -   Assessment: 1.  Medication management:  Patient had all prescribed medications.  Caregiver reports filling weekly pill box yesterday.  Per review of pill box, all medications appear to be placed in box appropriately, except hydralazine was excluded.  Hydralazine was added to the pill box.  Assisted patient in filling a second weekly pill box.  Will have caregiver fill another pill box before my next visit.    2.  Medication adherence:  Patient reports adherence to medications, but based on last weeks pill box, patient appears to be approximately 50% adherent with medications.  Patient voices today that she occasionally takes medications out of her pill bottles though instead of using the pill boxes.  Encouraged patient to only take medication out of pill boxes to reduce confusion.    3.  Blood pressure:  Not at goal of <140/90.  Continue to encourage compliance with all medications.     Plan: 1.  Medication management:  Assisted in filling two weeks of pill boxes for the patient.  All scheduled medications were included in the pill box except ferrous sulfate because patient's bottle had expired.  Encouraged patient to pick up more  ferrous sulfate from the pharmacy.   2.  Medication adherence:  Encouraged patient to take all medications as prescribed using the filled pill boxes.  Counseled patient not to alternate between taking medications out of pill box and out of bottles as that may increase confusion.   3.   Follow up home visit scheduled for 01/02/15 at 1:00 PM.  Requested for patient's caregiver to refill patient's medication box prior to my next visit.  Will assess filled pill box again at next visit.     THN CM Care Plan Problem One        Most Recent Value   Care Plan Problem One  Medication adherence   Role Documenting the Problem One  Clinical Pharmacist   Care Plan for Problem One  Active   THN CM Short Term Goal #1 (0-30 days)  In the next 30 days, patient will take all medications as prescribed as evidence by empty pill box.    THN CM Short Term Goal #1 Start Date  11/10/14   THN CM Short Term Goal #1 Met Date  -- [goal not met]   Interventions for Short Term Goal #1  Patient had one full weekly pill box.  Patient's caregiver reports he filled the weekly pill box yesterday.  Patient had not taken her morning medications out of the pill box but reports taking her medicaitons this morning out of the medication bottles instead.  Patient had missed several doses from her medication box from last week (2 morning, 3 mid-day, 4 evening and 4 bedtime slots had not been taken).    THN CM Short Term Goal #2 (0-30 days)  Patient will use the filled pill boxes to take her medications instead of taking medications out of the bottles over the next 30 days per patient report.    THN CM Short Term Goal #2 Start Date  12/19/14   Interventions for Short Term Goal #2  Discussed  importance of taking medications as prescribed.  Reviewed the pill box set up by patient's caregiver and filled a second weekly pillbox for the patient.       [name], Pharm.D. Pharmacy Resident [place] [phone]

## 2014-12-23 ENCOUNTER — Encounter (HOSPITAL_COMMUNITY)
Admission: RE | Admit: 2014-12-23 | Discharge: 2014-12-23 | Disposition: A | Discharge status: Home / Self Care | Payer: Medicare Other | Source: Ambulatory Visit | Attending: Nephrology | Admitting: Nephrology

## 2014-12-23 DIAGNOSIS — D631 Anemia in chronic kidney disease: Secondary | ICD-10-CM | POA: Diagnosis not present

## 2014-12-23 DIAGNOSIS — N183 Chronic kidney disease, stage 3 (moderate): Secondary | ICD-10-CM | POA: Insufficient documentation

## 2014-12-23 LAB — IRON AND TIBC
IRON: 51 ug/dL (ref 28–170)
SATURATION RATIOS: 21 % (ref 10.4–31.8)
TIBC: 239 ug/dL — AB (ref 250–450)
UIBC: 188 ug/dL

## 2014-12-23 LAB — POCT HEMOGLOBIN-HEMACUE: Hemoglobin: 9.6 g/dL — ABNORMAL LOW (ref 12.0–15.0)

## 2014-12-23 LAB — FERRITIN: Ferritin: 125 ng/mL (ref 11–307)

## 2014-12-23 MED ORDER — EPOETIN ALFA 20000 UNIT/ML IJ SOLN
INTRAMUSCULAR | Status: AC
  Filled 2014-12-23: qty 1

## 2014-12-23 MED ORDER — EPOETIN ALFA 20000 UNIT/ML IJ SOLN
20000.0000 [IU] | INTRAMUSCULAR | Status: DC
  Administered 2014-12-23: 20000 [IU] via SUBCUTANEOUS

## 2014-12-26 DIAGNOSIS — H2513 Age-related nuclear cataract, bilateral: Secondary | ICD-10-CM | POA: Diagnosis not present

## 2014-12-26 DIAGNOSIS — E113213 Type 2 diabetes mellitus with mild nonproliferative diabetic retinopathy with macular edema, bilateral: Secondary | ICD-10-CM | POA: Diagnosis not present

## 2014-12-30 ENCOUNTER — Encounter (HOSPITAL_COMMUNITY)
Admission: RE | Admit: 2014-12-30 | Discharge: 2014-12-30 | Disposition: A | Discharge status: Home / Self Care | Payer: Medicare Other | Source: Ambulatory Visit | Attending: Nephrology | Admitting: Nephrology

## 2014-12-30 ENCOUNTER — Telehealth: Payer: Self-pay | Admitting: Pulmonary Disease

## 2014-12-30 DIAGNOSIS — D631 Anemia in chronic kidney disease: Secondary | ICD-10-CM | POA: Diagnosis not present

## 2014-12-30 DIAGNOSIS — N183 Chronic kidney disease, stage 3 (moderate): Secondary | ICD-10-CM | POA: Diagnosis not present

## 2014-12-30 LAB — CBC
HEMATOCRIT: 33.9 % — AB (ref 36.0–46.0)
HEMOGLOBIN: 10.6 g/dL — AB (ref 12.0–15.0)
MCH: 22.8 pg — AB (ref 26.0–34.0)
MCHC: 31.3 g/dL (ref 30.0–36.0)
MCV: 72.9 fL — AB (ref 78.0–100.0)
Platelets: 251 10*3/uL (ref 150–400)
RBC: 4.65 MIL/uL (ref 3.87–5.11)
RDW: 16.4 % — ABNORMAL HIGH (ref 11.5–15.5)
WBC: 8.9 10*3/uL (ref 4.0–10.5)

## 2014-12-30 LAB — RENAL FUNCTION PANEL
ANION GAP: 8 (ref 5–15)
Albumin: 3.3 g/dL — ABNORMAL LOW (ref 3.5–5.0)
BUN: 26 mg/dL — ABNORMAL HIGH (ref 6–20)
CALCIUM: 9 mg/dL (ref 8.9–10.3)
CO2: 28 mmol/L (ref 22–32)
Chloride: 105 mmol/L (ref 101–111)
Creatinine, Ser: 1.58 mg/dL — ABNORMAL HIGH (ref 0.44–1.00)
GFR calc non Af Amer: 32 mL/min — ABNORMAL LOW (ref >60)
GFR, EST AFRICAN AMERICAN: 37 mL/min — AB (ref >60)
Glucose, Bld: 200 mg/dL — ABNORMAL HIGH (ref 65–99)
POTASSIUM: 4 mmol/L (ref 3.5–5.1)
Phosphorus: 2.9 mg/dL (ref 2.5–4.6)
SODIUM: 141 mmol/L (ref 135–145)

## 2014-12-30 LAB — MAGNESIUM: Magnesium: 2 mg/dL (ref 1.7–2.4)

## 2014-12-30 MED ORDER — AZITHROMYCIN 250 MG PO TABS: ORAL_TABLET | ORAL | Status: DC

## 2014-12-30 MED ORDER — EPOETIN ALFA 20000 UNIT/ML IJ SOLN
INTRAMUSCULAR | Status: AC
  Administered 2014-12-30: 20000 [IU] via SUBCUTANEOUS
  Filled 2014-12-30: qty 1

## 2014-12-30 MED ORDER — EPOETIN ALFA 20000 UNIT/ML IJ SOLN
20000.0000 [IU] | INTRAMUSCULAR | Status: DC
  Administered 2014-12-30: 20000 [IU] via SUBCUTANEOUS

## 2014-12-30 NOTE — Telephone Encounter (Signed)
Pt c/o chest congestion with green mucus, cough, SOB, wheezing and chest tightness.  States that she went to [facility] get her shot for her blood clotting and they advised her that she needed to call her Dr to have them give her an abx because she sounded congested.  Please advise Dr Melvyn Novas as Dr Halford Chessman is off this afternoon.  Allergies  Allergen Reactions  . Promethazine Hcl Other (See Comments)    REACTION: lethargy

## 2014-12-30 NOTE — Telephone Encounter (Signed)
zpak Avoid prednisone since dm > ov first of week if not better to er in meantime if worse

## 2014-12-30 NOTE — Telephone Encounter (Signed)
Called and spoke with pt and informed of MW rec and medication instriuctions Pt voiced understanding Pharmacy verified with pt and order sent electronically   Nothing further is needed at this time.

## 2014-12-31 LAB — PTH, INTACT AND CALCIUM
CALCIUM TOTAL (PTH): 9 mg/dL (ref 8.7–10.3)
PTH: 71 pg/mL — AB (ref 15–65)

## 2014-12-31 LAB — VITAMIN D 25 HYDROXY (VIT D DEFICIENCY, FRACTURES): VIT D 25 HYDROXY: 28.9 ng/mL — AB (ref 30.0–100.0)

## 2015-01-02 ENCOUNTER — Telehealth: Payer: Self-pay | Admitting: Internal Medicine

## 2015-01-02 ENCOUNTER — Other Ambulatory Visit: Payer: Self-pay | Admitting: Pharmacist

## 2015-01-02 ENCOUNTER — Encounter: Payer: Self-pay | Admitting: Pharmacist

## 2015-01-02 NOTE — Telephone Encounter (Signed)
Spoke with pt and given appt with Dr Halford Chessman 01/03/15 at 3:00.  No better after taking Zpak.

## 2015-01-02 NOTE — Patient Outreach (Signed)
[facility]) Care Management  [place]   01/02/2015  Joann Gutierrez November 17, 1945 [id number]  Subjective: Joann Gutierrez is a 69yo who I am following for medication adherence.  I made follow up home visit today to review compliance and assess pill boxes filled by patient's caregiver.  Patient's caregiver Joann Gutierrez was also present for the visit.    Patient started a 5 day course of azithromycin on Friday 12/30/14 for "chest congestion with green mucus, cough, SOB, wheezing, and chest tightness."  Patient has one day of antibiotic remaining.  Today, patient continues to report coughing with green mucus and reports she is currently using rescue inhaler two or three times daily.  When patient called Dr. Gustavus Bryant office last week for antibiotic prescription, patient was encouraged to call the office again at the beginning of this week if symptoms did not improve.  Patient called Dr. Gustavus Bryant office.  Office visit scheduled for tomorrow, 01/03/15, at 3:00 PM.  Patient had not used Symbicort inhaler yet today.  Counseled patient on maintenance versus rescue inhaler.  Counseled patient on purpose, proper use, and adverse effects of Symbicort including thrush and importance of rinsing mouth after using inhaler.  Had patient take scheduled Symbicort dose and demonstrate inhaler technique.  Counseled patient on over the counter medications to assist with cough and chest congestion such as guaifenesin and dextromethorphan.  Again advised patient to avoid phenylephrine due to its effect on blood pressure.    I made last home visit two weeks ago and at that time patient had two weekly pill boxes filled.  Since that time, patient reports compliance with all medications in the pill box and denies missing any doses.  Patient had one empty pill box and one pill box had been refilled by patient's caregiver.  Patient had already taken this morning's medications.  I congratulated patient on  adherence with all medications in the pill box.  Patient reports that pill burden has improved since moving her vitamins to the lunch time dosing slot in the pill box.    I reviewed the pill box filled by patient's caregiver.  Joann Gutierrez reports patient's sister also helped with filling the pill box this week. Joann Gutierrez had appropriately filled patient's pillbox with all prescribed medications.  Noted on one day that a dose of furosemide and losartan was in the bedtime slot instead of the morning slot.  I discussed my findings with patient's caregiver and corrected the pill box.  I had Joann Gutierrez fill the second weekly pillbox while I was at the home.  Joann Gutierrez was able to fill pill box without my assistance.  I reviewed the pill box after it was filled.  All prescriptions were placed in the box appropriately; however, raloxifene was accidentally omitted.  I counseled caregiver on the omission and added raloxifene to the pill box.    Patient reports compliance with Lantus 30 units daily in the morning.  Patient reports monitoring blood glucose daily and patient reports average blood glucose in the 150s.  Patient denies hypoglycemia.  Patient reports she had not checked blood glucose or taken insulin yet this morning.  Patient checked blood glucose during visit and blood glucose was 371m/dL.  Patient reports eating a large meal last night of beef stew, rice, corn, cabbage, and pecan pie.    Objective:  Blood pressure: 156/66  Current Medications: Current Outpatient Prescriptions  Medication Sig Dispense Refill  . albuterol (VENTOLIN HFA) 108 (90 BASE) MCG/ACT inhaler Inhale 2 puffs into  the lungs every 6 (six) hours as needed for shortness of breath. 1 Inhaler 11  . aspirin 325 MG tablet Take 325 mg by mouth daily.     Marland Kitchen aspirin 81 MG tablet Take 81 mg by mouth daily.    Marland Kitchen atorvastatin (LIPITOR) 40 MG tablet Take 1 tablet (40 mg total) by mouth daily. (Patient taking differently: Take 40 mg by mouth at bedtime. ) 30  tablet 6  . azithromycin (ZITHROMAX Z-PAK) 250 MG tablet Use as directed 6 each 0  . Calcium Carbonate-Vit D-Min (CALTRATE PLUS PO) Take 600 mg by mouth every morning.     Marland Kitchen Cod Liver Oil CAPS Take 1 capsule by mouth daily.    . cyclobenzaprine (FLEXERIL) 10 MG tablet Take 10 mg by mouth at bedtime.     Marland Kitchen dextromethorphan-guaiFENesin (ROBITUSSIN-DM) 10-100 MG/5ML liquid Take 10 mLs by mouth every 4 (four) hours as needed for cough.    . donepezil (ARICEPT) 10 MG tablet Take 10 mg by mouth at bedtime.     Marland Kitchen doxazosin (CARDURA) 2 MG tablet Take 1 tablet by mouth at bedtime.    Marland Kitchen ezetimibe (ZETIA) 10 MG tablet Take 10 mg by mouth at bedtime.     . fluticasone (FLONASE) 50 MCG/ACT nasal spray Place 2 sprays into both nostrils daily. 16 g 11  . furosemide (LASIX) 80 MG tablet Take 1 tablet by mouth 2 (two) times daily.    Marland Kitchen gabapentin (NEURONTIN) 300 MG capsule Take 1 capsule (300 mg total) by mouth 2 (two) times daily. 60 capsule 5  . hydrALAZINE (APRESOLINE) 100 MG tablet Take 100 mg by mouth 3 (three) times daily.     . insulin glargine (LANTUS) 100 UNIT/ML injection Inject 0.15 mLs (15 Units total) into the skin daily. (Patient taking differently: Inject 30 Units into the skin daily. ) 10 mL 11  . insulin NPH (HUMULIN N) 100 UNIT/ML injection Inject 2-13 Units into the skin 3 (three) times daily after meals. Sliding scale with each meal    . isosorbide mononitrate (IMDUR) 120 MG 24 hr tablet TAKE ONE (1) TABLET BY MOUTH EVERY DAY 30 tablet 0  . KLOR-CON M20 20 MEQ tablet Take 1 tablet by mouth daily.     Marland Kitchen losartan (COZAAR) 100 MG tablet Take 1 tablet by mouth every morning.     . metoCLOPramide (REGLAN) 10 MG tablet TAKE ONE TABLET BEFORE MEALS AND AT BEDTIME 120 tablet 2  . metolazone (ZAROXOLYN) 2.5 MG tablet Take 2.5 mg by mouth 3 (three) times a week. Take 1 tablet by mouth on Monday and Friday    . montelukast (SINGULAIR) 10 MG tablet Take 1 tablet (10 mg total) by mouth daily. 30 tablet 11   . Multiple Vitamins-Minerals (MULTIVITAMIN GUMMIES ADULT) CHEW Chew 1 capsule by mouth daily.    . nitroGLYCERIN (NITROSTAT) 0.4 MG SL tablet Place 1 tablet (0.4 mg total) under the tongue every 5 (five) minutes as needed for chest pain. 25 tablet 12  . Omega-3 Fatty Acids (FISH OIL) 1000 MG CAPS Take 1 capsule by mouth daily.    Marland Kitchen omeprazole (PRILOSEC) 40 MG capsule Take one po daily 90 capsule 3  . OXYGEN Inhale into the lungs. CPAP with oxygen at bedtime    . raloxifene (EVISTA) 60 MG tablet Take 60 mg by mouth every morning.     . sucralfate (CARAFATE) 1 GM/10ML suspension Take 10 mLs (1 g total) by mouth 2 (two) times daily. 1260 mL 3  . SYMBICORT  160-4.5 MCG/ACT inhaler INHALE 2 PUFFS INTO LUNGS 2 TIMES DAILY 10.2 g 5  . Vitamin D, Ergocalciferol, (DRISDOL) 50000 UNITS CAPS capsule Take 50,000 Units by mouth every Monday.     . ferrous sulfate 325 (65 FE) MG EC tablet Take 325 mg by mouth every morning.    Marland Kitchen LIDODERM 5 % Apply patch to area for 12 hours as needed for pain    . traMADol (ULTRAM) 50 MG tablet Take 50 mg by mouth every 6 (six) hours as needed.     No current facility-administered medications for this visit.   Functional Status: In your present state of health, do you have any difficulty performing the following activities: 12/30/2014 12/09/2014  Hearing? N N  Vision? N N  Difficulty concentrating or making decisions? N N  Walking or climbing stairs? Y Y  Dressing or bathing? N N  Doing errands, shopping? - Facilities manager and eating ? - -  Using the Toilet? - -  In the past six months, have you accidently leaked urine? - -  Do you have problems with loss of bowel control? - -  Managing your Medications? - -  Managing your Finances? - -  Housekeeping or managing your Housekeeping? - -   Fall/Depression Screening: PHQ 2/9 Scores 12/08/2014 11/09/2014 10/03/2014 04/07/2013  PHQ - 2 Score _0 PHQ- 9 Score - _1 Exception Documentation - - Other- indicate  reason in comment box -    Assessment: 1.  Medication management:  Patient had all prescribed medications.  Caregiver filled weekly pill box yesterday and filled second pill box while I was at the home.  Coaching was required during visit to correct a few medications in the pill box.  Counseled caregiver to read instructions on prescription bottles carefully to ensure pill box is filled correctly.  I provided caregiver with an updated list of patient's medications and encouraged him to use list to help in filling the pill box and to place a check by the medication name as he places them in the pill box to help prevent omissions.  Caregiver voiced understanding.  Joann Gutierrez reports he feels comfortable filling pill box on his own but requests another home visit to ensure he is filling boxes correctly.  Will have caregiver fill another pill box before my next visit.    2.  Medication adherence:  Patient reports adherence to medications which is confirmed by empty pill box.    3.  Blood pressure:  Not at goal of <140/90.  Continue to encourage compliance with all medications.    4.  Diabetes:  Blood glucose elevated during visit likely due to high carbohydrate meal last night, acute illness, and possible insulin noncompliance.  Most recent A1c was 7.4% (11/30/14).  Encouraged compliance with Lantus 30 units daily and encouraged patient to continue to monitor blood glucose daily.     Plan: 1.  Medication management:  Patient has two weekly pill boxes that have been filled by patient's caregiver.  All scheduled medications were included in the pill box except ferrous sulfate as patient did not pick up any from the pharmacy since last visit.  Encouraged patient to pick up more ferrous sulfate from the pharmacy.   2.  Medication adherence:  Encouraged patient to continue to take all medications as prescribed using the filled pill boxes.   3.   Follow up home visit scheduled for 01/10/15 at 10:00 AM.  Requested  for patient's  caregiver to refill patient's medication box prior to my next visit.  Will assess filled pill box again at next visit.   THN CM Care Plan Problem One        Most Recent Value   Care Plan Problem One  Medication adherence   Role Documenting the Problem One  Clinical Pharmacist   Care Plan for Problem One  Active   THN CM Short Term Goal #1 (0-30 days)  In the next 30 days, patient will take all medications as prescribed as evidence by empty pill box.    THN CM Short Term Goal #1 Start Date  11/10/14   [facility] CM Short Term Goal #1 Met Date  12/19/14 [goal not met]   Interventions for Short Term Goal #1  Patient had one full weekly pill box.  Patient's caregiver reports he filled the weekly pill box yesterday.  Patient had not taken her morning medications out of the pill box but reports taking her medicaitons this morning out of the medication bottles instead.  Patient had missed several doses from her medication box from last week (2 morning, 3 mid-day, 4 evening and 4 bedtime slots had not been taken).    THN CM Short Term Goal #2 (0-30 days)  Patient will use the filled pill boxes to take her medications instead of taking medications out of the bottles over the next 30 days per patient report.    THN CM Short Term Goal #2 Start Date  12/19/14   Interventions for Short Term Goal #2  Patient reports she only took her medications out of the pill box over the past two weeks.  Patient reports adherence with all medications.  Counseled patient to continue to take medications as prescribed.       [name], Pharm.D. Pharmacy Resident [place] [phone]

## 2015-01-03 ENCOUNTER — Encounter: Payer: Self-pay | Admitting: Pulmonary Disease

## 2015-01-03 ENCOUNTER — Ambulatory Visit (INDEPENDENT_AMBULATORY_CARE_PROVIDER_SITE_OTHER): Payer: Medicare Other | Admitting: Pulmonary Disease

## 2015-01-03 VITALS — BP 138/74 | HR 87 | Ht 64.0 in | Wt 283.0 lb

## 2015-01-03 DIAGNOSIS — J0100 Acute maxillary sinusitis, unspecified: Secondary | ICD-10-CM

## 2015-01-03 DIAGNOSIS — J45909 Unspecified asthma, uncomplicated: Secondary | ICD-10-CM

## 2015-01-03 DIAGNOSIS — J449 Chronic obstructive pulmonary disease, unspecified: Secondary | ICD-10-CM

## 2015-01-03 DIAGNOSIS — G4733 Obstructive sleep apnea (adult) (pediatric): Secondary | ICD-10-CM | POA: Diagnosis not present

## 2015-01-03 MED ORDER — AMOXICILLIN-POT CLAVULANATE 875-125 MG PO TABS: 1.0000 | ORAL_TABLET | Freq: Two times a day (BID) | ORAL | Status: DC

## 2015-01-03 NOTE — Progress Notes (Signed)
Chief Complaint  Patient presents with  . Acute Visit    pt states she started with a cold about 2 weeks ago and it has progressed. pt c/o prod cough green in color,  increase SOB on exertion, and a little chest tightness.  pt states she has taken robitussin and musinex and that has helped her bring mucous up but shr doesnt seem to have gotten better overall. pt is using her CPAP  every night    Tests PSG 04/28/09 >> AHI 11, SpO2 low 68% Spirometry 07/05/10 >> FEV1 1.30 (64%), FEV1% 80 >> coughing during test Echo 05/21/11 >> EF 55 to 60%, mild LVH, mild/mod TR, PAS 45 mmHg ONO with CPAP and room air 10/22/12 >> Test time 10 hrs 59 min. Mean SpO2 92.4%, low SpO2 75%. Spent 16 min with SpO2 < 89%. RAST 12/11/12 >> negative, IgE 63.3 Echo 02/04/13 >> mod LVH, EF 65 to [id number], grade 1 diastolic dysfx, PAS 42 mmHg Lt, Rt Heart cath 03/03/13 >> RA 10, RV 38/7, PA 41/16 28, PCWP 13, LV 142/5, AO 137/76 PFT 03/17/13 >> FEV1 1.27 (67%), FEV1% 84, TLC 4.06 (80%), DLCO 57%, borderline BD CPAP 07/17/14 to 08/14/14 >> used on 27 of 30 nights with average 7 hrs and 48 min.  Average AHI is 4.6 with CPAP 9 cm H2O.   Past medical history Depression, DM, HTN, Diastolic dysfunction, HLD, CAD, CVA, GERD, Esophageal stricture, Gastroparesis, Hiatal hernia, Sickle cell trait, CKD  PSHx, Medications, Allergies, Fhx, Shx reviewed  Vital Signs BP 138/74 mmHg  Pulse 87  Ht 5\' 4"  (1.626 m)  Wt 283 lb (128.368 kg)  BMI 48.55 kg/m2  SpO2 94%  History of Present Illness: Joann Gutierrez is a 69 y.o. 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  She developed sinus pressure about 2 weeks ago.  She is getting clear to yellow drainage.  She denies nose bleeds.  This is draining in her throat and she is getting cough.  She is not having wheeze.  She gets hot and then chilled >> she has not checked her temperature.  She denies ear pain or gland swelling.  She denies  diarrhea.  Physical Exam:  General - No distress ENT - Rt > Lt maxillary sinus tenderness, clear to yellow nasal discharge, TM clear, no oral exudate, no LAN Cardiac - s1s2 regular, no murmur Chest - no wheeze/rales Back - No focal tenderness Abd - Soft, non-tender Ext - No edema Neuro - Normal strength Skin - No rashes Psych - normal mood, and behavior   Assessment/Plan:  Acute sinusitis. Her symptoms have been present for 2 weeks. Plan:  - will give course of augmentin  Upper airway cough syndrome with post nasal drip. Plan: - continue flonase  COPD with asthma. Plan: - continue symbicort and singulair - prn albuterol  Chronic respiratory failure. From COPD/asthma, and Rt hemidiaphragm elevation. Plan: - continue 2 liters oxygen at night  Obstructive sleep apnea. She is compliant with CPAP and reports benefit. Plan: - continue CPAP 9 cm H2O  Obesity. Plan: - discussed importance of weight loss   [name], MD [facility] Pager:  [phone]

## 2015-01-03 NOTE — Patient Instructions (Signed)
Augmentin 1 pill twice daily for 7 days >> if not fully recovered, then take for an additional 1 week  Call if not feeling better after course of antibiotics  Follow up in 6 months

## 2015-01-04 DIAGNOSIS — E889 Metabolic disorder, unspecified: Secondary | ICD-10-CM | POA: Diagnosis not present

## 2015-01-04 DIAGNOSIS — M9080 Osteopathy in diseases classified elsewhere, unspecified site: Secondary | ICD-10-CM | POA: Diagnosis not present

## 2015-01-04 DIAGNOSIS — D638 Anemia in other chronic diseases classified elsewhere: Secondary | ICD-10-CM | POA: Diagnosis not present

## 2015-01-04 DIAGNOSIS — I10 Essential (primary) hypertension: Secondary | ICD-10-CM | POA: Diagnosis not present

## 2015-01-04 DIAGNOSIS — N183 Chronic kidney disease, stage 3 (moderate): Secondary | ICD-10-CM | POA: Diagnosis not present

## 2015-01-06 ENCOUNTER — Encounter (HOSPITAL_COMMUNITY)
Admission: RE | Admit: 2015-01-06 | Discharge: 2015-01-06 | Disposition: A | Discharge status: Home / Self Care | Payer: Medicare Other | Source: Ambulatory Visit | Attending: Nephrology | Admitting: Nephrology

## 2015-01-06 DIAGNOSIS — N183 Chronic kidney disease, stage 3 (moderate): Secondary | ICD-10-CM | POA: Diagnosis not present

## 2015-01-06 DIAGNOSIS — D631 Anemia in chronic kidney disease: Secondary | ICD-10-CM | POA: Diagnosis not present

## 2015-01-06 LAB — POCT HEMOGLOBIN-HEMACUE: HEMOGLOBIN: 10.1 g/dL — AB (ref 12.0–15.0)

## 2015-01-06 MED ORDER — EPOETIN ALFA 20000 UNIT/ML IJ SOLN
INTRAMUSCULAR | Status: AC
  Filled 2015-01-06: qty 1

## 2015-01-06 MED ORDER — EPOETIN ALFA 20000 UNIT/ML IJ SOLN
20000.0000 [IU] | INTRAMUSCULAR | Status: DC
  Administered 2015-01-06: 20000 [IU] via SUBCUTANEOUS

## 2015-01-10 ENCOUNTER — Other Ambulatory Visit: Payer: Self-pay | Admitting: Pharmacist

## 2015-01-10 ENCOUNTER — Encounter: Payer: Self-pay | Admitting: Pharmacist

## 2015-01-10 VITALS — BP 147/70 | HR 74

## 2015-01-10 DIAGNOSIS — I5032 Chronic diastolic (congestive) heart failure: Secondary | ICD-10-CM

## 2015-01-10 NOTE — Patient Outreach (Signed)
Joann Gutierrez) Care Management  [place]   01/10/2015  TENNILLE Gutierrez 08/20/1945 [id number]  Subjective: Joann Gutierrez is a 69yo who I am following for medication adherence.  I made follow up home visit today to review compliance and assess pill boxes filled by patient's caregiver.  Patient's caregiver Joann Gutierrez was also present for the visit.  Patient reports she is doing ok today.  She reports continued congestion with cough and green mucus.  Patient continues to use dextromethorphan-guaifenesin as needed for cough/chest congestion.  Patient reports compliance with Symbicort 2 puffs BID.  Patient was prescribed a course of Augmentin on 01/03/15 at pulmonology visit.  Patient reports finishing 7-day course of Augmentin yesterday.  Patient was instructed to take a second week of Augmentin if not fully recovered.  Patient plans to take the second week.  Patient was able to call her pharmacy and request the second week of antibiotic be filled and delivered today.  Counseled patient to call doctor as instructed if she is not feeling better after course of antibiotics.    Patient reports compliance with her medications since my last home visit on 01/02/15 and denies missing any doses of her medications.  Patient has two weekly pill boxes.  Today one pill box was completely empty, and the other was full.  Patient's caregiver had not refilled the empty pill box since my last visit.  I had patient's caregiver Joann Gutierrez fill the empty pill box without my assistance.  I reviewed the pill box filled by patient's caregiver during the visit.  Noted metoclopramide was not placed in the pill box and patient had only put gabapentin in pill box once daily in the morning instead of twice daily as prescribed.  Hydralazine was also omitted from the morning and mid-day slots on Sunday and furosemide was placed in the morning and mid-day slots twice on Thursday.  I discussed my findings with patient's  caregiver and corrected the pill box.  Patient reports compliance with Lantus 30 units daily.  Blood glucose today was 174m/dL.  Patient denies hypoglycemia.    Objective:  Blood pressure: 147/70  Current Medications: Current Outpatient Prescriptions  Medication Sig Dispense Refill  . albuterol (VENTOLIN HFA) 108 (90 BASE) MCG/ACT inhaler Inhale 2 puffs into the lungs every 6 (six) hours as needed for shortness of breath. 1 Inhaler 11  . amoxicillin-clavulanate (AUGMENTIN) 875-125 MG tablet Take 1 tablet by mouth 2 (two) times daily. 14 tablet 1  . aspirin 325 MG tablet Take 325 mg by mouth daily.     .Marland Kitchenaspirin 81 MG tablet Take 81 mg by mouth daily.    .Marland Kitchenatorvastatin (LIPITOR) 40 MG tablet Take 1 tablet (40 mg total) by mouth daily. (Patient taking differently: Take 40 mg by mouth at bedtime. ) 30 tablet 6  . Calcium Carbonate-Vit D-Min (CALTRATE PLUS PO) Take 600 mg by mouth every morning.     .Marland KitchenCod Liver Oil CAPS Take 1 capsule by mouth daily.    . cyclobenzaprine (FLEXERIL) 10 MG tablet Take 10 mg by mouth at bedtime.     .Marland Kitchendextromethorphan-guaiFENesin (ROBITUSSIN-DM) 10-100 MG/5ML liquid Take 10 mLs by mouth every 4 (four) hours as needed for cough.    . donepezil (ARICEPT) 10 MG tablet Take 10 mg by mouth at bedtime.     .Marland Kitchendoxazosin (CARDURA) 2 MG tablet Take 1 tablet by mouth at bedtime.    .Marland Kitchenezetimibe (ZETIA) 10 MG tablet Take 10 mg by mouth at  bedtime.     . fluticasone (FLONASE) 50 MCG/ACT nasal spray Place 2 sprays into both nostrils daily. 16 g 11  . furosemide (LASIX) 80 MG tablet Take 1 tablet by mouth 2 (two) times daily.    Marland Kitchen gabapentin (NEURONTIN) 300 MG capsule Take 1 capsule (300 mg total) by mouth 2 (two) times daily. 60 capsule 5  . hydrALAZINE (APRESOLINE) 100 MG tablet Take 100 mg by mouth 3 (three) times daily.     . insulin glargine (LANTUS) 100 UNIT/ML injection Inject 0.15 mLs (15 Units total) into the skin daily. (Patient taking differently: Inject 30 Units into  the skin daily. ) 10 mL 11  . insulin NPH (HUMULIN N) 100 UNIT/ML injection Inject 2-13 Units into the skin 3 (three) times daily after meals. Sliding scale with each meal    . isosorbide mononitrate (IMDUR) 120 MG 24 hr tablet TAKE ONE (1) TABLET BY MOUTH EVERY DAY 30 tablet 0  . KLOR-CON M20 20 MEQ tablet Take 1 tablet by mouth daily.     Marland Kitchen losartan (COZAAR) 100 MG tablet Take 1 tablet by mouth every morning.     . metoCLOPramide (REGLAN) 10 MG tablet TAKE ONE TABLET BEFORE MEALS AND AT BEDTIME 120 tablet 2  . metolazone (ZAROXOLYN) 2.5 MG tablet Take 2.5 mg by mouth 3 (three) times a week. Take 1 tablet by mouth on Monday and Friday    . montelukast (SINGULAIR) 10 MG tablet Take 1 tablet (10 mg total) by mouth daily. 30 tablet 11  . Multiple Vitamins-Minerals (MULTIVITAMIN GUMMIES ADULT) CHEW Chew 1 capsule by mouth daily.    . nitroGLYCERIN (NITROSTAT) 0.4 MG SL tablet Place 1 tablet (0.4 mg total) under the tongue every 5 (five) minutes as needed for chest pain. 25 tablet 12  . Omega-3 Fatty Acids (FISH OIL) 1000 MG CAPS Take 1 capsule by mouth daily.    Marland Kitchen omeprazole (PRILOSEC) 40 MG capsule Take one po daily 90 capsule 3  . OXYGEN Inhale into the lungs. CPAP with oxygen at bedtime    . raloxifene (EVISTA) 60 MG tablet Take 60 mg by mouth every morning.     . sucralfate (CARAFATE) 1 GM/10ML suspension Take 10 mLs (1 g total) by mouth 2 (two) times daily. 1260 mL 3  . SYMBICORT 160-4.5 MCG/ACT inhaler INHALE 2 PUFFS INTO LUNGS 2 TIMES DAILY 10.2 g 5  . Vitamin D, Ergocalciferol, (DRISDOL) 50000 UNITS CAPS capsule Take 50,000 Units by mouth every Monday.     . ferrous sulfate 325 (65 FE) MG EC tablet Take 325 mg by mouth every morning. Reported on 01/10/2015    . LIDODERM 5 % Reported on 01/10/2015    . traMADol (ULTRAM) 50 MG tablet Take 50 mg by mouth every 6 (six) hours as needed. Reported on 01/10/2015     No current facility-administered medications for this visit.    Functional  Status: In your present state of health, do you have any difficulty performing the following activities: 12/30/2014 12/09/2014  Hearing? N N  Vision? N N  Difficulty concentrating or making decisions? N N  Walking or climbing stairs? Y Y  Dressing or bathing? N N  Doing errands, shopping? - Facilities manager and eating ? - -  Using the Toilet? - -  In the past six months, have you accidently leaked urine? - -  Do you have problems with loss of bowel control? - -  Managing your Medications? - -  Managing your Finances? - -  Housekeeping or managing your Housekeeping? - -    Fall/Depression Screening: PHQ 2/9 Scores 12/08/2014 11/09/2014 10/03/2014 04/07/2013  PHQ - 2 Score _0 PHQ- 9 Score - _1 Exception Documentation - - Other- indicate reason in comment box -    Assessment: 1.  Medication management:  Patient had all prescribed medications.  Caregiver filled weekly pill box while I was at the home.  Coaching was required during visit to correct a few medications in the pill box.  Counseled caregiver to read instructions on prescription bottles carefully to ensure pill box is filled correctly and use medication list that I provided patient.  Caregiver voiced understanding.  I will continue to work with patient's caregiver on a system for filling patient's pill boxes.    2.  Medication adherence:  Patient reports adherence to medications and denies missing any doses since my last visit.  Patient's pill boxes confirm that patient took all medications in one of her weekly pill box; however, patient should have started on second weekly pill box yesterday, but patient had not taken any medications out of the second pill box.  Patient reports she took her medications this morning out of last week's pill box.  I confirmed with patient that she is taking her medications out of the pill box based on the day of the week and patient confirmed.  Encouraged patient to continue to take  medications as prescribed using filled pill boxes and counseled patient to make sure she is taking medications out of the pill box based on the day of the week.    3.  Blood pressure:  Patient's blood pressure has improved to 147/70.  Continue to encourage compliance with all medications.    4.  Diabetes:  Blood glucose improved to 197m/dL today.  Patient denies hypoglycemia.  Most recent A1c was 7.4% (11/30/14).  Encouraged compliance with Lantus 30 units daily and encouraged patient to continue to monitor blood glucose daily.  5.  Heart failure:  Patient reports history of heart failure and denies weighing daily.  She reports deficit with knowledge of heart failure.  I counseled patient on importance of weighing daily.  I reviewed heart failure zones today during visit and discussed when to call her provider or when to seek emergency care based on heart failure zones.  Patient reports following low/no salt diet.    Plan: 1.  Medication management:  Patient has two weekly pill boxes that have been filled by patient's caregiver.  All scheduled medications were included in the pill box except ferrous sulfate as patient has not picked up any from the pharmacy since last visit.  Encouraged patient to pick up more ferrous sulfate from the pharmacy.  Patient's caregiver took note of name of medication to pick up from the store.   2.  Medication adherence:  Encouraged patient to continue to take all medications as prescribed using the filled pill boxes.  Counseled patient to make sure she is taking medications out of the correct day of the week.   3.  [facility].      4.   Follow up home visit scheduled for 01/24/14 at 10:00 AM.  Requested for patient's caregiver to refill patient's medication box prior to my next visit.  Will assess filled pill box again at next visit.   [facility]  Most Recent Value    Care Plan Problem One  Medication adherence   Role Documenting the Problem One  Clinical Pharmacist   Care Plan for Problem One  Active   [facility] CM Short Term Goal #1 (0-30 days)  In the next 30 days, patient will take all medications as prescribed as evidence by empty pill box.    [facility] CM Short Term Goal #1 Start Date  11/10/14   [facility] CM Short Term Goal #1 Met Date  12/19/14 [goal not met]   Interventions for Short Term Goal #1  Patient had one full weekly pill box.  Patient's caregiver reports he filled the weekly pill box yesterday.  Patient had not taken her morning medications out of the pill box but reports taking her medicaitons this morning out of the medication bottles instead.  Patient had missed several doses from her medication box from last week (2 morning, 3 mid-day, 4 evening and 4 bedtime slots had not been taken).    [facility] CM Short Term Goal #2 (0-30 days)  Patient will use the filled pill boxes to take her medications instead of taking medications out of the bottles over the next 30 days per patient report.    [facility] CM Short Term Goal #2 Start Date  12/19/14   [facility] CM Short Term Goal #2 Met Date  01/10/15   Interventions for Short Term Goal #2  Patient reports compliance with all medications and reports taking medications out of the pill box.    [facility] CM Short Term Goal #3 (0-30 days)  Will develop a system for filling patient's pill boxes with patient's caregiver in the next 30 days.    [facility] CM Short Term Goal #3 Start Date  01/10/15   Interventions for Short Tern Goal #3  Coached patient's caregiver on filling pill box and provided caregiver with a list of patient's medications to use when filling the pill boxes.  Counseled caregiver to closely look at medication instructions when filling the pill box to ensure medications are placed in box correctly.      [name], Pharm.D. Pharmacy Resident [place] [phone]

## 2015-01-11 ENCOUNTER — Encounter: Payer: Self-pay | Admitting: Podiatry

## 2015-01-11 ENCOUNTER — Other Ambulatory Visit: Payer: Self-pay | Admitting: Cardiology

## 2015-01-11 ENCOUNTER — Other Ambulatory Visit: Payer: Self-pay

## 2015-01-11 ENCOUNTER — Ambulatory Visit (INDEPENDENT_AMBULATORY_CARE_PROVIDER_SITE_OTHER): Payer: Medicare Other | Admitting: Podiatry

## 2015-01-11 DIAGNOSIS — B351 Tinea unguium: Secondary | ICD-10-CM

## 2015-01-11 DIAGNOSIS — M79606 Pain in leg, unspecified: Secondary | ICD-10-CM | POA: Diagnosis not present

## 2015-01-11 NOTE — Patient Outreach (Signed)
Referral received from [place], [name], for HF education. Telephone contact made with patient who reluctantly identified herself by providing her date of birth and address.   Patient and this RNCM agreed to meet on January 26, 2015 for HF education.

## 2015-01-11 NOTE — Progress Notes (Signed)
Subjective: 69 y.o. year old diabetic female patient presents walking with a cane complaining of painful nails. Patient requests toe nails trimmed.  Blood sugar this under control.  Objective: Dermatologic:   Hypertrophic nails x 10.  No plantar lesions, no abnormal skin lesions noted.  Vascular:  All pedal pulses are palpable bilateral.  No pedal edema noted.  Orthopedic:  Mild hallux valgus with bunion on left foot.  Neurologic:  All epicritic and tactile sensations grossly intact.   Assessment:  Dystrophic mycotic nails x 10.  IDDM, under control.  Painful feet.   Treatment: All mycotic nails debrided.  Reviewed proper shoe gear and socks.  Return in 3 months or as needed

## 2015-01-11 NOTE — Telephone Encounter (Signed)
Rx(s) sent to pharmacy electronically.  

## 2015-01-11 NOTE — Patient Instructions (Signed)
Seen for hypertrophic nails. All nails debrided. Return in 3 months or as needed.  

## 2015-01-13 ENCOUNTER — Encounter (HOSPITAL_COMMUNITY)
Admission: RE | Admit: 2015-01-13 | Discharge: 2015-01-13 | Disposition: A | Discharge status: Home / Self Care | Payer: Medicare Other | Source: Ambulatory Visit | Attending: Nephrology | Admitting: Nephrology

## 2015-01-13 DIAGNOSIS — N183 Chronic kidney disease, stage 3 (moderate): Secondary | ICD-10-CM | POA: Diagnosis not present

## 2015-01-13 DIAGNOSIS — D631 Anemia in chronic kidney disease: Secondary | ICD-10-CM | POA: Diagnosis not present

## 2015-01-13 MED ORDER — EPOETIN ALFA 20000 UNIT/ML IJ SOLN
INTRAMUSCULAR | Status: AC
  Filled 2015-01-13: qty 1

## 2015-01-13 MED ORDER — EPOETIN ALFA 20000 UNIT/ML IJ SOLN
20000.0000 [IU] | INTRAMUSCULAR | Status: DC
  Administered 2015-01-13: 20000 [IU] via SUBCUTANEOUS

## 2015-01-17 LAB — POCT HEMOGLOBIN-HEMACUE: HEMOGLOBIN: 11.4 g/dL — AB (ref 12.0–15.0)

## 2015-01-20 ENCOUNTER — Encounter (HOSPITAL_COMMUNITY)
Admission: RE | Admit: 2015-01-20 | Discharge: 2015-01-20 | Disposition: A | Discharge status: Home / Self Care | Payer: Medicare Other | Source: Ambulatory Visit | Attending: Nephrology | Admitting: Nephrology

## 2015-01-20 ENCOUNTER — Ambulatory Visit: Payer: Medicare Other | Admitting: Internal Medicine

## 2015-01-20 DIAGNOSIS — D631 Anemia in chronic kidney disease: Secondary | ICD-10-CM | POA: Diagnosis not present

## 2015-01-20 DIAGNOSIS — N183 Chronic kidney disease, stage 3 (moderate): Secondary | ICD-10-CM | POA: Diagnosis not present

## 2015-01-20 LAB — FERRITIN: Ferritin: 76 ng/mL (ref 11–307)

## 2015-01-20 LAB — IRON AND TIBC
Iron: 58 ug/dL (ref 28–170)
Saturation Ratios: 25 % (ref 10.4–31.8)
TIBC: 237 ug/dL — ABNORMAL LOW (ref 250–450)
UIBC: 179 ug/dL

## 2015-01-20 MED ORDER — EPOETIN ALFA 20000 UNIT/ML IJ SOLN
20000.0000 [IU] | INTRAMUSCULAR | Status: DC
  Administered 2015-01-20: 20000 [IU] via SUBCUTANEOUS

## 2015-01-20 MED ORDER — EPOETIN ALFA 20000 UNIT/ML IJ SOLN
INTRAMUSCULAR | Status: AC
  Administered 2015-01-20: 20000 [IU] via SUBCUTANEOUS
  Filled 2015-01-20: qty 1

## 2015-01-24 ENCOUNTER — Telehealth: Payer: Self-pay

## 2015-01-24 ENCOUNTER — Other Ambulatory Visit: Payer: Self-pay

## 2015-01-24 LAB — POCT HEMOGLOBIN-HEMACUE: HEMOGLOBIN: 10.4 g/dL — AB (ref 12.0–15.0)

## 2015-01-24 NOTE — Patient Outreach (Signed)
This RNCM received a call from patient's emergency contact who requested rescheduling of January 5 appointment.  Patient verified her date of birth and address as HIPPA identifiers  This RNCM and patient and her caregiver agreed to reschedule visit to January 12 at [place]: Home visit on February 02, 2015

## 2015-01-25 ENCOUNTER — Other Ambulatory Visit: Payer: Self-pay | Admitting: Pharmacist

## 2015-01-25 ENCOUNTER — Encounter: Payer: Self-pay | Admitting: Pharmacist

## 2015-01-25 NOTE — Patient Outreach (Signed)
[facility]) Care Management  Joann Gutierrez   01/25/2015  Joann Gutierrez April 23, 1945 [id number]  Subjective: Joann Gutierrez is a 70yo who I am following for medication adherence.  I made follow up home visit today to review compliance and assist with pill box fills  Patient reports she is feeling better today.  She reports completing Augmentin course with improvement in cough and congestion.    Patient reports compliance with her medications since my last home visit and reports taking her medications this morning.  Patient has two weekly pill boxes.  Today one pill box was completely empty, and the other was empty for the first half of the week (patient had medications remaining for this evening through Saturday).  Patient's caregiver had not refilled the empty pill box since my last visit and was unable to assist in filling patient's pill box today during visit.    Discussed Symbicort with patient and patient reports she is using as needed.  Reviewed purpose and proper use of Symbicort and counseled patient to take Symbicort 2 puffs BID as prescribed.  Patient reports understanding.     Patient reports compliance with Lantus 30 units daily.  Blood glucose today was 171mg /dL.  Patient denies hypoglycemia.     Objective:   Current Medications: Current Outpatient Prescriptions  Medication Sig Dispense Refill  . albuterol (VENTOLIN HFA) 108 (90 BASE) MCG/ACT inhaler Inhale 2 puffs into the lungs every 6 (six) hours as needed for shortness of breath. 1 Inhaler 11  . aspirin 325 MG tablet Take 325 mg by mouth daily.     Marland Kitchen aspirin 81 MG tablet Take 81 mg by mouth daily.    Marland Kitchen atorvastatin (LIPITOR) 40 MG tablet Take 1 tablet (40 mg total) by mouth daily. (Patient taking differently: Take 40 mg by mouth at bedtime. ) 30 tablet 6  . Calcium Carbonate-Vit D-Min (CALTRATE PLUS PO) Take 600 mg by mouth every morning.     Marland Kitchen Cod Liver Oil CAPS Take 1 capsule by mouth daily.     . cyclobenzaprine (FLEXERIL) 10 MG tablet Take 10 mg by mouth at bedtime.     . donepezil (ARICEPT) 10 MG tablet Take 10 mg by mouth at bedtime.     Marland Kitchen doxazosin (CARDURA) 2 MG tablet Take 1 tablet by mouth at bedtime.    Marland Kitchen ezetimibe (ZETIA) 10 MG tablet Take 10 mg by mouth at bedtime.     . fluticasone (FLONASE) 50 MCG/ACT nasal spray Place 2 sprays into both nostrils daily. 16 g 11  . furosemide (LASIX) 80 MG tablet Take 1 tablet by mouth 2 (two) times daily.    Marland Kitchen gabapentin (NEURONTIN) 300 MG capsule Take 1 capsule (300 mg total) by mouth 2 (two) times daily. 60 capsule 5  . hydrALAZINE (APRESOLINE) 100 MG tablet Take 100 mg by mouth 3 (three) times daily.     . insulin glargine (LANTUS) 100 UNIT/ML injection Inject 0.15 mLs (15 Units total) into the skin daily. (Patient taking differently: Inject 30 Units into the skin daily. ) 10 mL 11  . isosorbide mononitrate (IMDUR) 120 MG 24 hr tablet Take 1 tablet (120 mg total) by mouth daily. PATIENT NEEDS TO CONTACT OFFICE FOR ADDITIONAL REFILLS 15 tablet 0  . KLOR-CON M20 20 MEQ tablet Take 1 tablet by mouth daily.     Marland Kitchen losartan (COZAAR) 100 MG tablet Take 1 tablet by mouth every morning.     . metoCLOPramide (REGLAN) 10 MG tablet TAKE ONE  TABLET BEFORE MEALS AND AT BEDTIME 120 tablet 2  . metolazone (ZAROXOLYN) 2.5 MG tablet Take 2.5 mg by mouth 3 (three) times a week. Take 1 tablet by mouth on Monday and Friday    . montelukast (SINGULAIR) 10 MG tablet Take 1 tablet (10 mg total) by mouth daily. 30 tablet 11  . Multiple Vitamins-Minerals (MULTIVITAMIN GUMMIES ADULT) CHEW Chew 1 capsule by mouth daily.    . nitroGLYCERIN (NITROSTAT) 0.4 MG SL tablet Place 1 tablet (0.4 mg total) under the tongue every 5 (five) minutes as needed for chest pain. 25 tablet 12  . Omega-3 Fatty Acids (FISH OIL) 1000 MG CAPS Take 1 capsule by mouth daily.    Marland Kitchen omeprazole (PRILOSEC) 40 MG capsule Take one po daily 90 capsule 3  . OXYGEN Inhale into the lungs. CPAP with  oxygen at bedtime    . raloxifene (EVISTA) 60 MG tablet Take 60 mg by mouth every morning.     . sucralfate (CARAFATE) 1 GM/10ML suspension Take 10 mLs (1 g total) by mouth 2 (two) times daily. 1260 mL 3  . SYMBICORT 160-4.5 MCG/ACT inhaler INHALE 2 PUFFS INTO LUNGS 2 TIMES DAILY 10.2 g 5  . Vitamin D, Ergocalciferol, (DRISDOL) 50000 UNITS CAPS capsule Take 50,000 Units by mouth every Monday.     Marland Kitchen dextromethorphan-guaiFENesin (ROBITUSSIN-DM) 10-100 MG/5ML liquid Take 10 mLs by mouth every 4 (four) hours as needed for cough. Reported on 01/25/2015    . ferrous sulfate 325 (65 FE) MG EC tablet Take 325 mg by mouth every morning. Reported on 01/25/2015    . insulin NPH (HUMULIN N) 100 UNIT/ML injection Inject 2-13 Units into the skin 3 (three) times daily after meals. Reported on 01/25/2015    . LIDODERM 5 % Reported on 01/25/2015    . traMADol (ULTRAM) 50 MG tablet Take 50 mg by mouth every 6 (six) hours as needed. Reported on 01/25/2015     No current facility-administered medications for this visit.   Functional Status: In your present state of health, do you have any difficulty performing the following activities: 12/30/2014 12/09/2014  Hearing? N N  Vision? N N  Difficulty concentrating or making decisions? N N  Walking or climbing stairs? Y Y  Dressing or bathing? N N  Doing errands, shopping? - Facilities manager and eating ? - -  Using the Toilet? - -  In the past six months, have you accidently leaked urine? - -  Do you have problems with loss of bowel control? - -  Managing your Medications? - -  Managing your Finances? - -  Housekeeping or managing your Housekeeping? - -   Fall/Depression Screening: PHQ 2/9 Scores 12/08/2014 11/09/2014 10/03/2014 04/07/2013  PHQ - 2 Score 1 4 6 2   PHQ- 9 Score - 12 12 14   Exception Documentation - - Other- indicate reason in comment box -    Assessment: 1.  Medication management:  Patient had all prescribed medications.  I assisted patient in filling  two weekly pill boxes as caregiver was unable to assist in filling patient's pill boxes today.  I will continue to work with patient's caregiver on a system for filling patient's pill boxes.    2.  Medication adherence:  Patient reports adherence to medications and denies missing any doses since my last visit.  Encouraged patient to continue to take medications as prescribed using filled pill boxes.    3.  Blood pressure:  Patient's blood pressure today at goal of less  than 140/90.  Continue to encourage compliance with all medications.    4.  Diabetes:  Blood glucose 171mg /dL today.  Patient denies hypoglycemia.  Most recent A1c was 7.4% (11/30/14).  Encouraged compliance with Lantus 30 units daily and encouraged patient to continue to monitor blood glucose daily.  Plan: 1.  Medication management:  I assisted patient in filling two weekly pill boxes.  All scheduled medications were included in the pill box except ferrous sulfate as patient has not picked up any from the pharmacy since last visit.  Counseled patient to pick up more ferrous sulfate from the pharmacy.   2.  Medication adherence:  Encouraged patient to continue to take all medications as prescribed using the filled pill boxes.  3.  Follow up home visit scheduled for 02/07/14 at 1100 AM.  Requested for patient's caregiver to refill patient's medication box prior to my next visit.  Will assess filled pill box again at next visit.    [name], Pharm.D. Pharmacy Resident [place] [phone]

## 2015-01-26 ENCOUNTER — Ambulatory Visit: Payer: Self-pay

## 2015-01-26 DIAGNOSIS — E113213 Type 2 diabetes mellitus with mild nonproliferative diabetic retinopathy with macular edema, bilateral: Secondary | ICD-10-CM | POA: Diagnosis not present

## 2015-01-26 DIAGNOSIS — H35033 Hypertensive retinopathy, bilateral: Secondary | ICD-10-CM | POA: Diagnosis not present

## 2015-01-26 DIAGNOSIS — H2513 Age-related nuclear cataract, bilateral: Secondary | ICD-10-CM | POA: Diagnosis not present

## 2015-01-27 ENCOUNTER — Encounter (HOSPITAL_COMMUNITY)
Admission: RE | Admit: 2015-01-27 | Discharge: 2015-01-27 | Disposition: A | Discharge status: Home / Self Care | Payer: Medicare Other | Source: Ambulatory Visit | Attending: Nephrology | Admitting: Nephrology

## 2015-01-27 DIAGNOSIS — D631 Anemia in chronic kidney disease: Secondary | ICD-10-CM | POA: Diagnosis not present

## 2015-01-27 DIAGNOSIS — N183 Chronic kidney disease, stage 3 (moderate): Secondary | ICD-10-CM | POA: Insufficient documentation

## 2015-01-27 LAB — RENAL FUNCTION PANEL
ALBUMIN: 3.1 g/dL — AB (ref 3.5–5.0)
Anion gap: 9 (ref 5–15)
BUN: 21 mg/dL — ABNORMAL HIGH (ref 6–20)
CALCIUM: 8.7 mg/dL — AB (ref 8.9–10.3)
CO2: 26 mmol/L (ref 22–32)
Chloride: 110 mmol/L (ref 101–111)
Creatinine, Ser: 1.26 mg/dL — ABNORMAL HIGH (ref 0.44–1.00)
GFR calc Af Amer: 49 mL/min — ABNORMAL LOW (ref >60)
GFR calc non Af Amer: 42 mL/min — ABNORMAL LOW (ref >60)
Glucose, Bld: 111 mg/dL — ABNORMAL HIGH (ref 65–99)
POTASSIUM: 3.5 mmol/L (ref 3.5–5.1)
Phosphorus: 3.3 mg/dL (ref 2.5–4.6)
SODIUM: 145 mmol/L (ref 135–145)

## 2015-01-27 LAB — POCT HEMOGLOBIN-HEMACUE: HEMOGLOBIN: 11.2 g/dL — AB (ref 12.0–15.0)

## 2015-01-27 LAB — MAGNESIUM: Magnesium: 1.8 mg/dL (ref 1.7–2.4)

## 2015-01-27 MED ORDER — EPOETIN ALFA 20000 UNIT/ML IJ SOLN
INTRAMUSCULAR | Status: AC
  Filled 2015-01-27: qty 1

## 2015-01-27 MED ORDER — EPOETIN ALFA 20000 UNIT/ML IJ SOLN
20000.0000 [IU] | INTRAMUSCULAR | Status: DC
  Administered 2015-01-27: 20000 [IU] via SUBCUTANEOUS

## 2015-02-01 ENCOUNTER — Telehealth: Payer: Self-pay

## 2015-02-02 ENCOUNTER — Ambulatory Visit: Payer: Medicare Other

## 2015-02-02 ENCOUNTER — Ambulatory Visit: Payer: Self-pay

## 2015-02-02 ENCOUNTER — Other Ambulatory Visit: Payer: Self-pay

## 2015-02-02 NOTE — Patient Outreach (Signed)
[facility] ([facility]) Care Management  02/02/2015  Obie A Gillyard 02/25/1945 [id number]   This home visit met for initial Heart Failure education. Accompanied to visit by Eleanor River, [facility] Quality Manager. Caregiver, [name] McWilliams present during home visit.    Patient and RNCM discussed the following: Heart Failure Zones, Process foods and how these foods have high levels of sodium Importance of daily weights, at same time, with same amount of or no clothing, after voiding preferrable when she first arise in the morning.   Discussed pathophysiology of heart failure.   Patein admits to leading a sedentary life, sitting watching TV most of the day.    Plan: Home visit January 26 at 11am  

## 2015-02-03 ENCOUNTER — Encounter (HOSPITAL_COMMUNITY)
Admission: RE | Admit: 2015-02-03 | Discharge: 2015-02-03 | Disposition: A | Discharge status: Home / Self Care | Payer: Medicare Other | Source: Ambulatory Visit | Attending: Nephrology | Admitting: Nephrology

## 2015-02-03 DIAGNOSIS — N183 Chronic kidney disease, stage 3 (moderate): Secondary | ICD-10-CM | POA: Diagnosis not present

## 2015-02-03 DIAGNOSIS — D631 Anemia in chronic kidney disease: Secondary | ICD-10-CM | POA: Diagnosis not present

## 2015-02-03 LAB — POCT HEMOGLOBIN-HEMACUE: Hemoglobin: 10 g/dL — ABNORMAL LOW (ref 12.0–15.0)

## 2015-02-03 MED ORDER — EPOETIN ALFA 20000 UNIT/ML IJ SOLN
INTRAMUSCULAR | Status: AC
  Filled 2015-02-03: qty 1

## 2015-02-03 MED ORDER — EPOETIN ALFA 20000 UNIT/ML IJ SOLN
20000.0000 [IU] | INTRAMUSCULAR | Status: DC
  Administered 2015-02-03: 20000 [IU] via SUBCUTANEOUS

## 2015-02-07 ENCOUNTER — Other Ambulatory Visit: Payer: Self-pay | Admitting: Pharmacist

## 2015-02-07 NOTE — Patient Outreach (Signed)
[facility]) Care Management  [place]   02/07/2015  Joann Gutierrez November 20, 1945 [id number]  Subjective: Joann Gutierrez is a 69yo who I am following for medication adherence.  I made follow up visit today to review compliance and to review pill boxes filled by patient's caregiver.  Patient's caregiver [name] was also present for visit.  Patient reports compliance with her medications since my last home visit.  Based on patient's pill boxes she has taken all of her medications in the past two weeks except one evening dose of metoclopramide.  [name] had not filled pill boxes prior to visit; however, he filled two weekly pill boxes without assistance while I was at the home.   Discussed Symbicort with patient and patient reports using as needed.  Reviewed purpose, proper use, and adverse effects of Symbicort including thrush and importance of rinsing mouth after use and counseled patient to take Symbicort 2 puffs BID as prescribed.  Counseled patient on how to administer Symbicort and had patient demonstrate use during visit.    Patient reports compliance with Lantus 30 units daily.  Blood glucose today was 130m/dL.  Patient denies hypoglycemia.    Objective:  Blood pressure = 140/70  Current Medications: Current Outpatient Prescriptions  Medication Sig Dispense Refill  . albuterol (VENTOLIN HFA) 108 (90 BASE) MCG/ACT inhaler Inhale 2 puffs into the lungs every 6 (six) hours as needed for shortness of breath. 1 Inhaler 11  . aspirin 325 MG tablet Take 325 mg by mouth daily.     .Marland Kitchenaspirin 81 MG tablet Take 81 mg by mouth daily.    .Marland Kitchenatorvastatin (LIPITOR) 40 MG tablet Take 1 tablet (40 mg total) by mouth daily. (Patient taking differently: Take 40 mg by mouth at bedtime. ) 30 tablet 6  . Calcium Carbonate-Vit D-Min (CALTRATE PLUS PO) Take 600 mg by mouth every morning.     .Marland KitchenCod Liver Oil CAPS Take 1 capsule by mouth daily.    .Marland Kitchendextromethorphan-guaiFENesin  (ROBITUSSIN-DM) 10-100 MG/5ML liquid Take 10 mLs by mouth every 4 (four) hours as needed for cough. Reported on 01/25/2015    . donepezil (ARICEPT) 10 MG tablet Take 10 mg by mouth at bedtime.     .Marland Kitchendoxazosin (CARDURA) 2 MG tablet Take 1 tablet by mouth at bedtime.    .Marland Kitchenezetimibe (ZETIA) 10 MG tablet Take 10 mg by mouth at bedtime.     . ferrous sulfate 325 (65 FE) MG EC tablet Take 325 mg by mouth every morning. Reported on 01/25/2015    . fluticasone (FLONASE) 50 MCG/ACT nasal spray Place 2 sprays into both nostrils daily. 16 g 11  . furosemide (LASIX) 80 MG tablet Take 1 tablet by mouth 2 (two) times daily.    .Marland Kitchengabapentin (NEURONTIN) 300 MG capsule Take 1 capsule (300 mg total) by mouth 2 (two) times daily. 60 capsule 5  . hydrALAZINE (APRESOLINE) 100 MG tablet Take 100 mg by mouth 3 (three) times daily.     . insulin glargine (LANTUS) 100 UNIT/ML injection Inject 0.15 mLs (15 Units total) into the skin daily. (Patient taking differently: Inject 30 Units into the skin daily. ) 10 mL 11  . insulin NPH (HUMULIN N) 100 UNIT/ML injection Inject 2-13 Units into the skin 3 (three) times daily after meals. Reported on 01/25/2015    . isosorbide mononitrate (IMDUR) 120 MG 24 hr tablet Take 1 tablet (120 mg total) by mouth daily. PATIENT NEEDS TO CONTACT OFFICE FOR ADDITIONAL REFILLS  15 tablet 0  . KLOR-CON M20 20 MEQ tablet Take 1 tablet by mouth daily.     Marland Kitchen losartan (COZAAR) 100 MG tablet Take 1 tablet by mouth every morning.     . metoCLOPramide (REGLAN) 10 MG tablet TAKE ONE TABLET BEFORE MEALS AND AT BEDTIME 120 tablet 2  . metolazone (ZAROXOLYN) 2.5 MG tablet Take 2.5 mg by mouth 3 (three) times a week. Take 1 tablet by mouth on Monday and Friday    . montelukast (SINGULAIR) 10 MG tablet Take 1 tablet (10 mg total) by mouth daily. 30 tablet 11  . Multiple Vitamins-Minerals (MULTIVITAMIN GUMMIES ADULT) CHEW Chew 1 capsule by mouth daily.    . nitroGLYCERIN (NITROSTAT) 0.4 MG SL tablet Place 1 tablet  (0.4 mg total) under the tongue every 5 (five) minutes as needed for chest pain. 25 tablet 12  . Omega-3 Fatty Acids (FISH OIL) 1000 MG CAPS Take 1 capsule by mouth daily.    Marland Kitchen omeprazole (PRILOSEC) 40 MG capsule Take one po daily 90 capsule 3  . OXYGEN Inhale into the lungs. CPAP with oxygen at bedtime    . raloxifene (EVISTA) 60 MG tablet Take 60 mg by mouth every morning.     . sucralfate (CARAFATE) 1 GM/10ML suspension Take 10 mLs (1 g total) by mouth 2 (two) times daily. 1260 mL 3  . SYMBICORT 160-4.5 MCG/ACT inhaler INHALE 2 PUFFS INTO LUNGS 2 TIMES DAILY 10.2 g 5  . Vitamin D, Ergocalciferol, (DRISDOL) 50000 UNITS CAPS capsule Take 50,000 Units by mouth every Monday.     . cyclobenzaprine (FLEXERIL) 10 MG tablet Take 10 mg by mouth at bedtime. Reported on 02/07/2015    . LIDODERM 5 % Reported on 02/07/2015    . traMADol (ULTRAM) 50 MG tablet Take 50 mg by mouth every 6 (six) hours as needed. Reported on 02/07/2015     No current facility-administered medications for this visit.   Functional Status: In your present state of health, do you have any difficulty performing the following activities: 02/02/2015 01/27/2015  Hearing? - N  Vision? - N  Difficulty concentrating or making decisions? - N  Walking or climbing stairs? - Y  Dressing or bathing? - N  Doing errands, shopping? Y -  Conservation officer, nature and eating ? Y -  Using the Toilet? N -  In the past six months, have you accidently leaked urine? N -  Do you have problems with loss of bowel control? N -  Managing your Medications? Y -  Managing your Finances? N -  Housekeeping or managing your Housekeeping? Y -   Fall/Depression Screening: PHQ 2/9 Scores 02/02/2015 12/08/2014 11/09/2014 10/03/2014 04/07/2013  PHQ - 2 Score 0 _0 PHQ- 9 Score - - _1 Exception Documentation - - - Other- indicate reason in comment box -    Assessment: 1.  Medication management:  Patient had all prescribed medications.  Patient's sister had  picked up bottle of ferrous sulfate for patient since my last visit.  Patient's caregiver filled two weekly pill boxes during visit.  I reviewed the pill boxes filled by patient's caregiver.  All medications were placed in pill boxes.  Noted two discrepencies: atorvastatin was placed in noon slot instead of evening and gabapentin was placed in pill box once daily instead of twice daily as prescribed.  Caregiver used patient's medication list to cross reference contents of pill box and was able to identify discrepencies and correct them.  Patient's  caregiver has also started counting contents of each slot on the pill box and comapring number of medications in each slot to medication list to ensure all medications have been appropriately placed in pill box.  Patient's caregiver reports understanding of how to fill pill box at this time and denies any concerns with filling patient's pill box.  Patient reports understanding of how to request prescription refills from [facility] pharmacy and was able to demonstrate by calling [facility] to request a refill of omeprazole during the home visit.    2.  Medication adherence:  Patient reports adherence to medications which was confirmed by empty pill boxes.  Patient only missed once evening dose of metoclopramide in the past two weeks.  Patient reports she has not been taking Symbicort twice daily as prescribed.  I reviewed purpose, proper use, and adverse effects of Symbicort and counseled patient to take two puffs twice daily as prescribed.  Encouraged patient to continue to take medications as prescribed using filled pill boxes.    3.  Blood pressure:  Patient's blood pressure today was 140/70.  Continue to encourage compliance with all medications.    4.  Diabetes:  Blood glucose 127m/dL today.  Patient denies hypoglycemia.  Most recent A1c was 7.4% (11/30/14).  Encouraged compliance with Lantus 30 units daily and encouraged patient to continue to monitor blood glucose  daily.  Plan: Patient reports compliance with medications and patient's caregiver reports understanding of how to fill patient's pill box at this time.  Will close pharmacy program as goals have been met.  Provided patient and caregiver with my phone number and encouraged them to call me if they have any questions or concerns regarding patient's medications in the future.  Will alert [facility] program closure.    [facility] CM Care Plan Problem One        Most Recent Value   Care Plan Problem One  Medication adherence   Role Documenting the Problem One  Clinical Pharmacist   Care Plan for Problem One  Active   [facility] CM Short Term Goal #1 (0-30 days)  In the next 30 days, patient will take all medications as prescribed as evidence by empty pill box.    [facility] CM Short Term Goal #1 Start Date  11/10/14   [facility] #1 Met Date  02/07/15 [Barrie Folknot met]   Interventions for Short Term Goal #1  Per review of patient's pill boxes, patient had taken all medications in the past two weeks except one evening dose of metoclopramide.  Counseled patient to continue to take all medications as prescribed.    [facility] CM Short Term Goal #2 (0-30 days)  Patient will use the filled pill boxes to take her medications instead of taking medications out of the bottles over the next 30 days per patient report.    [facility] CM Short Term Goal #2 Start Date  12/19/14   [facility] Met Date  01/10/15   Interventions for Short Term Goal #2  Patient reports compliance with all medications and reports taking medications out of the pill box.    [facility] CM Short Term Goal #3 (0-30 days)  Will develop a system for filling patient's pill boxes with patient's caregiver in the next 30 days.    [facility] CM Short Term Goal #3 Start Date  01/10/15   [facility] #3 Met Date  02/07/15   Interventions for Short TEloise Harman  Goal #3  Patient's caregiver filled two weeks of pill boxes during visit today using a list  of patient's medications.  Noted two discrepencies after caregiver filled pill boxes (atorvastatin was placed in noon slot instead of evening and gabapentin was placed in pill box once daily instead of twice daily as prescribed).  Caregiver used patient's medication list to cross reference contents of pill box and was able to identify discrepencies and correct them.  Patient's caregiver has also started counting contents of each slot on the pill box and comapring number of medications in each slot to medication list to ensure all medications have been appropriately placed in pill box.  Patient's caregiver reports understanding of how to fill pill box at this time and denies any concerns with filling patient's pill box.      [name], Pharm.D. Pharmacy Resident [place] [phone]

## 2015-02-08 ENCOUNTER — Ambulatory Visit: Payer: Self-pay | Admitting: Pharmacist

## 2015-02-10 ENCOUNTER — Encounter (HOSPITAL_COMMUNITY)
Admission: RE | Admit: 2015-02-10 | Discharge: 2015-02-10 | Disposition: A | Discharge status: Home / Self Care | Payer: Medicare Other | Source: Ambulatory Visit | Attending: Nephrology | Admitting: Nephrology

## 2015-02-10 DIAGNOSIS — N183 Chronic kidney disease, stage 3 (moderate): Secondary | ICD-10-CM | POA: Diagnosis not present

## 2015-02-10 DIAGNOSIS — D631 Anemia in chronic kidney disease: Secondary | ICD-10-CM | POA: Diagnosis not present

## 2015-02-10 LAB — POCT HEMOGLOBIN-HEMACUE: HEMOGLOBIN: 10.1 g/dL — AB (ref 12.0–15.0)

## 2015-02-10 MED ORDER — EPOETIN ALFA 20000 UNIT/ML IJ SOLN
20000.0000 [IU] | INTRAMUSCULAR | Status: DC
  Administered 2015-02-10: 20000 [IU] via SUBCUTANEOUS

## 2015-02-10 MED ORDER — EPOETIN ALFA 20000 UNIT/ML IJ SOLN
INTRAMUSCULAR | Status: AC
  Filled 2015-02-10: qty 1

## 2015-02-13 ENCOUNTER — Telehealth: Payer: Self-pay | Admitting: Gastroenterology

## 2015-02-13 NOTE — Telephone Encounter (Signed)
Spoke with the patient. She states she is taking Omeprazole BID and taking Carafate. The medication list does not support this. She states she is eating small bite and drinking fluid with her meals. She feels like food is sitting in the middle of her chest. She vomits sometimes. She is also concerned because she gets choked on mucous in her throat. She cannot cough and clear. Appointment made for the patient. Stressed the importance of being on time for her appointment.

## 2015-02-16 ENCOUNTER — Other Ambulatory Visit: Payer: Self-pay

## 2015-02-16 NOTE — Patient Outreach (Signed)
[facility]) Care Management  02/16/2015  Joann Gutierrez [birth year] [id number]   Home visit with patient and caregiver. Patient consented to have caregiver, Mr. Clint Guy, to be present duiring this home visit. Patient and RNCM updated her care plan to reflect the progress she has been made.  Patient has a recorded weight loss of nine pounds since [place] 13 til today.  During this visit, patient and this RNCM reviewed pathophysiology of CHF, effects of increased fluid does on the heart.  Information was given to patient on other area center where she can go to walk. Patient states she is now eating smaller portions.  This RNCM praised patient and her caregiver on the progress made in managing her chronic illnesses. Patient has been compliant with medication regimen.  This RNCM and patient agreed on home visit in 2 weeks for further education on CHF

## 2015-02-17 ENCOUNTER — Encounter (HOSPITAL_COMMUNITY)
Admission: RE | Admit: 2015-02-17 | Discharge: 2015-02-17 | Disposition: A | Discharge status: Home / Self Care | Payer: Medicare Other | Source: Ambulatory Visit | Attending: Nephrology | Admitting: Nephrology

## 2015-02-17 DIAGNOSIS — D631 Anemia in chronic kidney disease: Secondary | ICD-10-CM | POA: Diagnosis not present

## 2015-02-17 DIAGNOSIS — N183 Chronic kidney disease, stage 3 (moderate): Secondary | ICD-10-CM | POA: Diagnosis not present

## 2015-02-17 LAB — IRON AND TIBC
Iron: 70 ug/dL (ref 28–170)
SATURATION RATIOS: 28 % (ref 10.4–31.8)
TIBC: 248 ug/dL — AB (ref 250–450)
UIBC: 178 ug/dL

## 2015-02-17 LAB — FERRITIN: Ferritin: 111 ng/mL (ref 11–307)

## 2015-02-17 LAB — POCT HEMOGLOBIN-HEMACUE: Hemoglobin: 10.6 g/dL — ABNORMAL LOW (ref 12.0–15.0)

## 2015-02-17 MED ORDER — EPOETIN ALFA 20000 UNIT/ML IJ SOLN
INTRAMUSCULAR | Status: AC
  Filled 2015-02-17: qty 1

## 2015-02-17 MED ORDER — EPOETIN ALFA 20000 UNIT/ML IJ SOLN
20000.0000 [IU] | INTRAMUSCULAR | Status: DC
  Administered 2015-02-17: 20000 [IU] via SUBCUTANEOUS

## 2015-02-24 ENCOUNTER — Encounter (HOSPITAL_COMMUNITY)
Admission: RE | Admit: 2015-02-24 | Discharge: 2015-02-24 | Disposition: A | Discharge status: Home / Self Care | Payer: Medicare Other | Source: Ambulatory Visit | Attending: Nephrology | Admitting: Nephrology

## 2015-02-24 DIAGNOSIS — N183 Chronic kidney disease, stage 3 (moderate): Secondary | ICD-10-CM | POA: Diagnosis not present

## 2015-02-24 DIAGNOSIS — D631 Anemia in chronic kidney disease: Secondary | ICD-10-CM | POA: Diagnosis not present

## 2015-02-24 LAB — RENAL FUNCTION PANEL
Albumin: 3.2 g/dL — ABNORMAL LOW (ref 3.5–5.0)
Anion gap: 9 (ref 5–15)
BUN: 43 mg/dL — AB (ref 6–20)
CHLORIDE: 110 mmol/L (ref 101–111)
CO2: 28 mmol/L (ref 22–32)
CREATININE: 1.73 mg/dL — AB (ref 0.44–1.00)
Calcium: 9.1 mg/dL (ref 8.9–10.3)
GFR calc Af Amer: 34 mL/min — ABNORMAL LOW (ref >60)
GFR calc non Af Amer: 29 mL/min — ABNORMAL LOW (ref >60)
GLUCOSE: 117 mg/dL — AB (ref 65–99)
Phosphorus: 4 mg/dL (ref 2.5–4.6)
Potassium: 3.7 mmol/L (ref 3.5–5.1)
Sodium: 147 mmol/L — ABNORMAL HIGH (ref 135–145)

## 2015-02-24 LAB — MAGNESIUM: Magnesium: 2 mg/dL (ref 1.7–2.4)

## 2015-02-24 MED ORDER — EPOETIN ALFA 20000 UNIT/ML IJ SOLN
INTRAMUSCULAR | Status: AC
  Filled 2015-02-24: qty 1

## 2015-02-24 MED ORDER — EPOETIN ALFA 20000 UNIT/ML IJ SOLN
20000.0000 [IU] | INTRAMUSCULAR | Status: DC
  Administered 2015-02-24: 20000 [IU] via SUBCUTANEOUS

## 2015-02-27 LAB — POCT HEMOGLOBIN-HEMACUE: HEMOGLOBIN: 10.7 g/dL — AB (ref 12.0–15.0)

## 2015-03-01 ENCOUNTER — Encounter: Payer: Self-pay | Admitting: Gastroenterology

## 2015-03-01 ENCOUNTER — Ambulatory Visit (INDEPENDENT_AMBULATORY_CARE_PROVIDER_SITE_OTHER): Payer: Medicare Other | Admitting: Gastroenterology

## 2015-03-01 VITALS — BP 160/88 | HR 90 | Ht 64.0 in | Wt 282.4 lb

## 2015-03-01 DIAGNOSIS — K224 Dyskinesia of esophagus: Secondary | ICD-10-CM | POA: Diagnosis not present

## 2015-03-01 DIAGNOSIS — R1310 Dysphagia, unspecified: Secondary | ICD-10-CM | POA: Diagnosis not present

## 2015-03-01 NOTE — Progress Notes (Signed)
     03/01/2015 Joann Gutierrez [id number] [birth year]   History of Present Illness:  This is a 70 year old female who is known to Dr. Deatra Ina for gastroparesis and recurrent complaints of dysphagia. She has multiple medical problems and is on several medications.  She underwent EGD March 2015 at which time she did not have any stricture, but esophagus was dilated empirically.  She had moderate response to the esophageal dilation.  She returned to the office in September 2016 with complaints of recurrent dysphagia. Esophagram showed mild esophageal dysmotility and mild reflux, but no strictures, etc.  We discussed chewing food well, etc and also increased to omeprazole 40 mg to BID.  She is still taking that increased dose., but comes in today again complaining of pills getting stuck and sometimes food as well.  Also continues to complain of a lot of throat clearing and a lot of mucus.  She also takes Reglan 10 mg with meals and at bedtime.    Current Medications, Allergies, Past Medical History, Past Surgical History, Family History and Social History were reviewed in [employer] record.   Physical Exam: BP 160/88 mmHg  Pulse 90  Ht 5\' 4"  (1.626 m)  Wt 282 lb 6.4 oz (128.096 kg)  BMI 48.45 kg/m2 General: Well developed black female in no acute distress Head: Normocephalic and atraumatic Eyes:  Sclerae anicteric, conjunctiva pink  Ears: Normal auditory acuity Lungs: Clear throughout to auscultation Heart: Regular rate and rhythm Abdomen: Soft, non-distended.  Normal bowel sounds.  Non-tender. Musculoskeletal: Symmetrical with no gross deformities  Extremities: No edema  Neurological: Alert oriented x 4, grossly nonfocal Psychological:  Alert and cooperative. Normal mood and affect  Assessment and Recommendations: -Dysphagia:  Had EGD with dilation in 2015 with some relief of symptoms but no definite stricture seen.  Esophagram in September 2016 showed esophageal  dysmotility.  She says that symptoms seem to be getting worse despite PPI, etc.  Will schedule for esophageal manometry to see if there is anything else we can do to help her symptoms.    *Just of note, patient's appointment was at 1:30 pm and her husband was in a hurry and anxious because they had somewhere else to be at 2:15 pm.  At her last visit in September she was 25 minutes late for her 10:00 appt here and then was anxious because she had an appt at primary care downstairs at 11:00 that she was trying to get to as well.  So this tends to be a trend.

## 2015-03-01 NOTE — Progress Notes (Signed)
Agree with assessment and plan as outlined. Will await manometry and notify her of the results and further recommendations.

## 2015-03-02 ENCOUNTER — Encounter: Payer: Self-pay | Admitting: Emergency Medicine

## 2015-03-03 ENCOUNTER — Encounter (HOSPITAL_COMMUNITY)
Admission: RE | Admit: 2015-03-03 | Discharge: 2015-03-03 | Disposition: A | Discharge status: Home / Self Care | Payer: Medicare Other | Source: Ambulatory Visit | Attending: Nephrology | Admitting: Nephrology

## 2015-03-03 ENCOUNTER — Telehealth: Payer: Self-pay | Admitting: Gastroenterology

## 2015-03-03 DIAGNOSIS — D631 Anemia in chronic kidney disease: Secondary | ICD-10-CM | POA: Diagnosis not present

## 2015-03-03 DIAGNOSIS — N183 Chronic kidney disease, stage 3 (moderate): Secondary | ICD-10-CM | POA: Diagnosis not present

## 2015-03-03 MED ORDER — EPOETIN ALFA 20000 UNIT/ML IJ SOLN
INTRAMUSCULAR | Status: AC
  Administered 2015-03-03: 20000 [IU] via SUBCUTANEOUS
  Filled 2015-03-03: qty 1

## 2015-03-03 MED ORDER — EPOETIN ALFA 20000 UNIT/ML IJ SOLN: 20000.0000 [IU] | INTRAMUSCULAR | Status: DC

## 2015-03-03 NOTE — Telephone Encounter (Signed)
Patient wanted to reschedule procedure due to another appt. Called Endo and rescheduled procedure. Patient informed.

## 2015-03-06 LAB — POCT HEMOGLOBIN-HEMACUE: HEMOGLOBIN: 10.6 g/dL — AB (ref 12.0–15.0)

## 2015-03-07 ENCOUNTER — Other Ambulatory Visit: Payer: Self-pay | Admitting: Internal Medicine

## 2015-03-08 DIAGNOSIS — Z1231 Encounter for screening mammogram for malignant neoplasm of breast: Secondary | ICD-10-CM | POA: Diagnosis not present

## 2015-03-08 DIAGNOSIS — Z01419 Encounter for gynecological examination (general) (routine) without abnormal findings: Secondary | ICD-10-CM | POA: Diagnosis not present

## 2015-03-08 DIAGNOSIS — Z6841 Body Mass Index (BMI) 40.0 and over, adult: Secondary | ICD-10-CM | POA: Diagnosis not present

## 2015-03-08 DIAGNOSIS — D509 Iron deficiency anemia, unspecified: Secondary | ICD-10-CM | POA: Diagnosis not present

## 2015-03-09 ENCOUNTER — Other Ambulatory Visit: Payer: Self-pay

## 2015-03-09 NOTE — Patient Outreach (Signed)
[facility]) Care Management  03/09/2015  Joann Gutierrez 1945-06-29 [id number]      Just received medications in mail yesterday, caregiver plans to fill med boxes today for 2 weeks. Patient states she has started

## 2015-03-10 ENCOUNTER — Encounter (HOSPITAL_COMMUNITY)
Admission: RE | Admit: 2015-03-10 | Discharge: 2015-03-10 | Disposition: A | Discharge status: Home / Self Care | Payer: Medicare Other | Source: Ambulatory Visit | Attending: Nephrology | Admitting: Nephrology

## 2015-03-10 DIAGNOSIS — N183 Chronic kidney disease, stage 3 (moderate): Secondary | ICD-10-CM | POA: Diagnosis not present

## 2015-03-10 DIAGNOSIS — D631 Anemia in chronic kidney disease: Secondary | ICD-10-CM | POA: Diagnosis not present

## 2015-03-10 LAB — POCT HEMOGLOBIN-HEMACUE: Hemoglobin: 10.2 g/dL — ABNORMAL LOW (ref 12.0–15.0)

## 2015-03-10 MED ORDER — EPOETIN ALFA 20000 UNIT/ML IJ SOLN
20000.0000 [IU] | INTRAMUSCULAR | Status: DC
  Administered 2015-03-10: 20000 [IU] via SUBCUTANEOUS

## 2015-03-10 MED ORDER — EPOETIN ALFA 20000 UNIT/ML IJ SOLN
INTRAMUSCULAR | Status: AC
  Administered 2015-03-10: 20000 [IU] via SUBCUTANEOUS
  Filled 2015-03-10: qty 1

## 2015-03-10 NOTE — Patient Outreach (Signed)
[facility]) Care Management  03/10/2015  Joann Gutierrez April 13, 1945 [id number]   This RNCM, patient and caregiver, Mr. [name] met for this discharge visit. Updated care plan and goals.  Patient stated she learned a lot about heart failure, was able to find HF Zones listings in the [place].  Patient encouraged to call if further  Assistance is needed.  Patient was able to produce [facility] contact information if further assistance is needed.  Patient ad caregiver reminded that self-referrals are appropriate.    Plan: Discharge patient from caseload Send primary care physician and patient discharge letter.

## 2015-03-13 ENCOUNTER — Ambulatory Visit (HOSPITAL_COMMUNITY)
Admission: RE | Admit: 2015-03-13 | Discharge: 2015-03-13 | Disposition: A | Discharge status: Home / Self Care | Payer: Medicare Other | Source: Ambulatory Visit | Attending: Gastroenterology | Admitting: Gastroenterology

## 2015-03-13 ENCOUNTER — Encounter (HOSPITAL_COMMUNITY)
Admission: RE | Disposition: A | Discharge status: Home / Self Care | Payer: Self-pay | Source: Ambulatory Visit | Attending: Gastroenterology

## 2015-03-13 DIAGNOSIS — R1310 Dysphagia, unspecified: Secondary | ICD-10-CM | POA: Diagnosis not present

## 2015-03-13 HISTORY — PX: ESOPHAGEAL MANOMETRY: SHX5429

## 2015-03-13 SURGERY — MANOMETRY, ESOPHAGUS

## 2015-03-13 MED ORDER — LIDOCAINE VISCOUS 2 % MT SOLN
OROMUCOSAL | Status: AC
  Filled 2015-03-13: qty 15

## 2015-03-13 SURGICAL SUPPLY — 2 items
FACESHIELD LNG OPTICON STERILE (SAFETY) IMPLANT
GLOVE BIO SURGEON STRL SZ8 (GLOVE) ×6 IMPLANT

## 2015-03-14 ENCOUNTER — Encounter (HOSPITAL_COMMUNITY): Payer: Self-pay | Admitting: Gastroenterology

## 2015-03-16 ENCOUNTER — Other Ambulatory Visit: Payer: Self-pay

## 2015-03-17 ENCOUNTER — Encounter (HOSPITAL_COMMUNITY)
Admission: RE | Admit: 2015-03-17 | Discharge: 2015-03-17 | Disposition: A | Discharge status: Home / Self Care | Payer: Medicare Other | Source: Ambulatory Visit | Attending: Nephrology | Admitting: Nephrology

## 2015-03-17 DIAGNOSIS — D631 Anemia in chronic kidney disease: Secondary | ICD-10-CM | POA: Diagnosis not present

## 2015-03-17 DIAGNOSIS — N183 Chronic kidney disease, stage 3 (moderate): Secondary | ICD-10-CM | POA: Diagnosis not present

## 2015-03-17 LAB — IRON AND TIBC
Iron: 56 ug/dL (ref 28–170)
SATURATION RATIOS: 24 % (ref 10.4–31.8)
TIBC: 237 ug/dL — AB (ref 250–450)
UIBC: 181 ug/dL

## 2015-03-17 LAB — FERRITIN: FERRITIN: 72 ng/mL (ref 11–307)

## 2015-03-17 LAB — POCT HEMOGLOBIN-HEMACUE: HEMOGLOBIN: 11.1 g/dL — AB (ref 12.0–15.0)

## 2015-03-17 MED ORDER — EPOETIN ALFA 20000 UNIT/ML IJ SOLN
20000.0000 [IU] | INTRAMUSCULAR | Status: DC
  Administered 2015-03-17: 20000 [IU] via SUBCUTANEOUS

## 2015-03-17 MED ORDER — EPOETIN ALFA 20000 UNIT/ML IJ SOLN
INTRAMUSCULAR | Status: AC
  Filled 2015-03-17: qty 1

## 2015-03-17 NOTE — Patient Outreach (Signed)
This RNCM made cal to patient to follow up with weighing daily. Patient states she has been weighing daily and her weights have been consistent . Patient states she understands the HF Action Plan Zones, knows that watching sodium will keep her weight down. Patient agrees to discharge and to call [facility] if further assistance is needed.    Plan: Discharge patient from caseload.   Discharge letters sent to primary care physician and patient acknowledging her meeting her case management goals.

## 2015-03-21 ENCOUNTER — Telehealth: Payer: Self-pay | Admitting: Gastroenterology

## 2015-03-21 NOTE — Telephone Encounter (Signed)
Rollene Fare can you please relay the manometry results to the patient.  She has normal motility on manometry, with the finding of a hypertensive LES, which relaxes normally and no evidence of achalasia. Unclear if this is causing her symptoms or something else. I have not seen her in clinic and noted she has a history of a CVA. I would like to see her in clinic to discuss this result, ensure no evidence of oropharyngeal dysphagia, and consider repeat endoscopy with dilation if this has helped her previously. Thanks

## 2015-03-21 NOTE — Telephone Encounter (Signed)
Left a message for patient to call for results.

## 2015-03-22 NOTE — Telephone Encounter (Signed)
Left a message for patient to call back for results

## 2015-03-22 NOTE — Telephone Encounter (Signed)
Patient given results and scheduled OV on 04/25/15 at 10:30 AM.

## 2015-03-23 ENCOUNTER — Other Ambulatory Visit: Payer: Self-pay

## 2015-03-24 ENCOUNTER — Ambulatory Visit (HOSPITAL_COMMUNITY)
Admission: RE | Admit: 2015-03-24 | Discharge: 2015-03-24 | Disposition: A | Discharge status: Home / Self Care | Payer: Medicare Other | Source: Ambulatory Visit | Attending: Nephrology | Admitting: Nephrology

## 2015-03-24 DIAGNOSIS — Z79899 Other long term (current) drug therapy: Secondary | ICD-10-CM | POA: Insufficient documentation

## 2015-03-24 DIAGNOSIS — Z5181 Encounter for therapeutic drug level monitoring: Secondary | ICD-10-CM | POA: Insufficient documentation

## 2015-03-24 DIAGNOSIS — D631 Anemia in chronic kidney disease: Secondary | ICD-10-CM | POA: Diagnosis not present

## 2015-03-24 DIAGNOSIS — N183 Chronic kidney disease, stage 3 (moderate): Secondary | ICD-10-CM | POA: Insufficient documentation

## 2015-03-24 LAB — RENAL FUNCTION PANEL
ANION GAP: 13 (ref 5–15)
Albumin: 3.3 g/dL — ABNORMAL LOW (ref 3.5–5.0)
BUN: 34 mg/dL — ABNORMAL HIGH (ref 6–20)
CHLORIDE: 103 mmol/L (ref 101–111)
CO2: 26 mmol/L (ref 22–32)
Calcium: 8.9 mg/dL (ref 8.9–10.3)
Creatinine, Ser: 1.79 mg/dL — ABNORMAL HIGH (ref 0.44–1.00)
GFR calc non Af Amer: 28 mL/min — ABNORMAL LOW (ref >60)
GFR, EST AFRICAN AMERICAN: 32 mL/min — AB (ref >60)
Glucose, Bld: 216 mg/dL — ABNORMAL HIGH (ref 65–99)
POTASSIUM: 3.9 mmol/L (ref 3.5–5.1)
Phosphorus: 4.5 mg/dL (ref 2.5–4.6)
Sodium: 142 mmol/L (ref 135–145)

## 2015-03-24 LAB — MAGNESIUM: Magnesium: 2.1 mg/dL (ref 1.7–2.4)

## 2015-03-24 LAB — POCT HEMOGLOBIN-HEMACUE: HEMOGLOBIN: 10.7 g/dL — AB (ref 12.0–15.0)

## 2015-03-24 MED ORDER — EPOETIN ALFA 20000 UNIT/ML IJ SOLN
20000.0000 [IU] | INTRAMUSCULAR | Status: DC
  Administered 2015-03-24: 20000 [IU] via SUBCUTANEOUS

## 2015-03-27 ENCOUNTER — Other Ambulatory Visit: Payer: Self-pay

## 2015-03-27 DIAGNOSIS — E1129 Type 2 diabetes mellitus with other diabetic kidney complication: Secondary | ICD-10-CM

## 2015-03-31 ENCOUNTER — Encounter (HOSPITAL_COMMUNITY)
Admission: RE | Admit: 2015-03-31 | Discharge: 2015-03-31 | Disposition: A | Discharge status: Home / Self Care | Payer: Medicare Other | Source: Ambulatory Visit | Attending: Nephrology | Admitting: Nephrology

## 2015-03-31 DIAGNOSIS — D631 Anemia in chronic kidney disease: Secondary | ICD-10-CM | POA: Diagnosis not present

## 2015-03-31 DIAGNOSIS — N183 Chronic kidney disease, stage 3 (moderate): Secondary | ICD-10-CM | POA: Insufficient documentation

## 2015-03-31 LAB — POCT HEMOGLOBIN-HEMACUE: Hemoglobin: 11.2 g/dL — ABNORMAL LOW (ref 12.0–15.0)

## 2015-03-31 MED ORDER — EPOETIN ALFA 20000 UNIT/ML IJ SOLN
INTRAMUSCULAR | Status: AC
  Filled 2015-03-31: qty 1

## 2015-03-31 MED ORDER — EPOETIN ALFA 20000 UNIT/ML IJ SOLN
20000.0000 [IU] | INTRAMUSCULAR | Status: DC
  Administered 2015-03-31: 20000 [IU] via SUBCUTANEOUS

## 2015-04-04 ENCOUNTER — Other Ambulatory Visit: Payer: Self-pay | Admitting: Adult Health

## 2015-04-04 ENCOUNTER — Other Ambulatory Visit: Payer: Self-pay | Admitting: Internal Medicine

## 2015-04-07 ENCOUNTER — Encounter (HOSPITAL_COMMUNITY)
Admission: RE | Admit: 2015-04-07 | Discharge: 2015-04-07 | Disposition: A | Discharge status: Home / Self Care | Payer: Medicare Other | Source: Ambulatory Visit | Attending: Nephrology | Admitting: Nephrology

## 2015-04-07 DIAGNOSIS — N183 Chronic kidney disease, stage 3 (moderate): Secondary | ICD-10-CM | POA: Diagnosis not present

## 2015-04-07 DIAGNOSIS — D631 Anemia in chronic kidney disease: Secondary | ICD-10-CM | POA: Diagnosis not present

## 2015-04-07 LAB — POCT HEMOGLOBIN-HEMACUE: Hemoglobin: 11.2 g/dL — ABNORMAL LOW (ref 12.0–15.0)

## 2015-04-07 MED ORDER — EPOETIN ALFA 20000 UNIT/ML IJ SOLN
20000.0000 [IU] | INTRAMUSCULAR | Status: DC
  Administered 2015-04-07: 20000 [IU] via SUBCUTANEOUS

## 2015-04-07 MED ORDER — EPOETIN ALFA 20000 UNIT/ML IJ SOLN
INTRAMUSCULAR | Status: AC
  Filled 2015-04-07: qty 1

## 2015-04-10 ENCOUNTER — Encounter: Payer: Self-pay | Admitting: Internal Medicine

## 2015-04-11 ENCOUNTER — Ambulatory Visit (INDEPENDENT_AMBULATORY_CARE_PROVIDER_SITE_OTHER): Payer: Medicare Other | Admitting: Podiatry

## 2015-04-11 ENCOUNTER — Other Ambulatory Visit: Payer: Self-pay | Admitting: *Deleted

## 2015-04-11 ENCOUNTER — Encounter: Payer: Self-pay | Admitting: Podiatry

## 2015-04-11 DIAGNOSIS — B351 Tinea unguium: Secondary | ICD-10-CM

## 2015-04-11 DIAGNOSIS — M79606 Pain in leg, unspecified: Secondary | ICD-10-CM

## 2015-04-11 NOTE — Progress Notes (Signed)
Subjective: 70 y.o. year old diabetic female patient presents walking with a cane complaining of painful nails. Patient is short of breath.  Blood sugar was 129 this morning.  Patient is wearing well supported tennis shoes.   Objective: Dermatologic:   Hypertrophic nails x 10.  No plantar lesions, no abnormal skin lesions noted.  Vascular:  All pedal pulses are palpable bilateral.  No pedal edema noted.  Orthopedic:  Mild hallux valgus with bunion on left foot.  Neurologic:  All epicritic and tactile sensations grossly intact.   Assessment:  Dystrophic mycotic nails x 10.  IDDM, under control.  Painful feet.   Treatment: All mycotic nails debrided.  Return in 3 months or as needed

## 2015-04-11 NOTE — Patient Instructions (Signed)
Seen for hypertrophic nails. All nails debrided. Return in 3 months or as needed.  

## 2015-04-11 NOTE — Patient Outreach (Signed)
[facility]) Care Management  04/11/2015  Joann Gutierrez 01/14/1946 [id number]  Referral from community care coordinator to health Coach for disease management: Screening call made to patient who gave HIPPA verification.  Patient advised of [place] services. Patient voices that currently she is concerned about her "diabetes and kidneys".   States she administers her own medications but friend who lives with her fixes medications for her. States she checks her blood sugar 3-4 times daily and administers her own insulin. Voices understanding of symptoms of low blood sugars & knows how to treat. Patient has no knowledge of " AC1 " level. States she needs to more about how she should be eating.  .   States she currently sees primary care doctor for diabetes and goes to Dr. Posey Pronto for kidneys. States she currently uses [employer] bus for transportation to & from MD appointments. 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 Currently using cane to assist with walking.   Patient does consent to [place] services for diabetes. States best time to call is after 2PM. Referral to Health Coach & will advise of the above information.  Telephonic care coordinator signing off.   [name] Daisy, RN BSN [place] Management Coordinator [facility] Care Management  [phone]

## 2015-04-14 ENCOUNTER — Encounter (HOSPITAL_COMMUNITY)
Admission: RE | Admit: 2015-04-14 | Discharge: 2015-04-14 | Disposition: A | Discharge status: Home / Self Care | Payer: Medicare Other | Source: Ambulatory Visit | Attending: Nephrology | Admitting: Nephrology

## 2015-04-14 DIAGNOSIS — D631 Anemia in chronic kidney disease: Secondary | ICD-10-CM | POA: Diagnosis not present

## 2015-04-14 DIAGNOSIS — N183 Chronic kidney disease, stage 3 (moderate): Secondary | ICD-10-CM | POA: Diagnosis not present

## 2015-04-14 LAB — IRON AND TIBC
Iron: 58 ug/dL (ref 28–170)
SATURATION RATIOS: 25 % (ref 10.4–31.8)
TIBC: 235 ug/dL — ABNORMAL LOW (ref 250–450)
UIBC: 177 ug/dL

## 2015-04-14 LAB — FERRITIN: Ferritin: 58 ng/mL (ref 11–307)

## 2015-04-14 LAB — POCT HEMOGLOBIN-HEMACUE: Hemoglobin: 10.9 g/dL — ABNORMAL LOW (ref 12.0–15.0)

## 2015-04-14 MED ORDER — EPOETIN ALFA 20000 UNIT/ML IJ SOLN
20000.0000 [IU] | INTRAMUSCULAR | Status: DC
  Administered 2015-04-14: 20000 [IU] via SUBCUTANEOUS

## 2015-04-14 MED ORDER — EPOETIN ALFA 20000 UNIT/ML IJ SOLN
INTRAMUSCULAR | Status: AC
  Filled 2015-04-14: qty 1

## 2015-04-15 LAB — PTH, INTACT AND CALCIUM
Calcium, Total (PTH): 8.5 mg/dL — ABNORMAL LOW (ref 8.7–10.3)
PTH: 94 pg/mL — AB (ref 15–65)

## 2015-04-19 DIAGNOSIS — N183 Chronic kidney disease, stage 3 (moderate): Secondary | ICD-10-CM | POA: Diagnosis not present

## 2015-04-19 DIAGNOSIS — I10 Essential (primary) hypertension: Secondary | ICD-10-CM | POA: Diagnosis not present

## 2015-04-19 DIAGNOSIS — Z1389 Encounter for screening for other disorder: Secondary | ICD-10-CM | POA: Diagnosis not present

## 2015-04-19 DIAGNOSIS — J45909 Unspecified asthma, uncomplicated: Secondary | ICD-10-CM | POA: Diagnosis not present

## 2015-04-19 DIAGNOSIS — E7800 Pure hypercholesterolemia, unspecified: Secondary | ICD-10-CM | POA: Diagnosis not present

## 2015-04-19 DIAGNOSIS — Z1211 Encounter for screening for malignant neoplasm of colon: Secondary | ICD-10-CM | POA: Diagnosis not present

## 2015-04-19 DIAGNOSIS — Z7189 Other specified counseling: Secondary | ICD-10-CM | POA: Diagnosis not present

## 2015-04-19 DIAGNOSIS — R413 Other amnesia: Secondary | ICD-10-CM | POA: Diagnosis not present

## 2015-04-19 DIAGNOSIS — Z0000 Encounter for general adult medical examination without abnormal findings: Secondary | ICD-10-CM | POA: Diagnosis not present

## 2015-04-19 DIAGNOSIS — E1322 Other specified diabetes mellitus with diabetic chronic kidney disease: Secondary | ICD-10-CM | POA: Diagnosis not present

## 2015-04-19 DIAGNOSIS — Z6841 Body Mass Index (BMI) 40.0 and over, adult: Secondary | ICD-10-CM | POA: Diagnosis not present

## 2015-04-19 DIAGNOSIS — I2510 Atherosclerotic heart disease of native coronary artery without angina pectoris: Secondary | ICD-10-CM | POA: Diagnosis not present

## 2015-04-21 ENCOUNTER — Encounter (HOSPITAL_COMMUNITY)
Admission: RE | Admit: 2015-04-21 | Discharge: 2015-04-21 | Disposition: A | Discharge status: Home / Self Care | Payer: Medicare Other | Source: Ambulatory Visit | Attending: Nephrology | Admitting: Nephrology

## 2015-04-21 DIAGNOSIS — N183 Chronic kidney disease, stage 3 (moderate): Secondary | ICD-10-CM | POA: Diagnosis not present

## 2015-04-21 DIAGNOSIS — D631 Anemia in chronic kidney disease: Secondary | ICD-10-CM | POA: Diagnosis not present

## 2015-04-21 LAB — POCT HEMOGLOBIN-HEMACUE: Hemoglobin: 10.7 g/dL — ABNORMAL LOW (ref 12.0–15.0)

## 2015-04-21 MED ORDER — EPOETIN ALFA 20000 UNIT/ML IJ SOLN: 20000.0000 [IU] | INTRAMUSCULAR | Status: DC

## 2015-04-21 MED ORDER — EPOETIN ALFA 20000 UNIT/ML IJ SOLN
INTRAMUSCULAR | Status: AC
  Administered 2015-04-21: 20000 [IU]
  Filled 2015-04-21: qty 1

## 2015-04-24 ENCOUNTER — Ambulatory Visit: Payer: Self-pay | Admitting: *Deleted

## 2015-04-25 ENCOUNTER — Ambulatory Visit: Payer: Medicare Other | Admitting: Gastroenterology

## 2015-04-28 ENCOUNTER — Encounter (HOSPITAL_COMMUNITY)
Admission: RE | Admit: 2015-04-28 | Discharge: 2015-04-28 | Disposition: A | Discharge status: Home / Self Care | Payer: Medicare Other | Source: Ambulatory Visit | Attending: Nephrology | Admitting: Nephrology

## 2015-04-28 ENCOUNTER — Ambulatory Visit: Payer: Self-pay | Admitting: *Deleted

## 2015-04-28 DIAGNOSIS — D631 Anemia in chronic kidney disease: Secondary | ICD-10-CM | POA: Diagnosis not present

## 2015-04-28 DIAGNOSIS — N183 Chronic kidney disease, stage 3 (moderate): Secondary | ICD-10-CM | POA: Diagnosis not present

## 2015-04-28 LAB — RENAL FUNCTION PANEL
ALBUMIN: 3.2 g/dL — AB (ref 3.5–5.0)
Anion gap: 10 (ref 5–15)
BUN: 60 mg/dL — AB (ref 6–20)
CALCIUM: 9 mg/dL (ref 8.9–10.3)
CO2: 29 mmol/L (ref 22–32)
CREATININE: 2.02 mg/dL — AB (ref 0.44–1.00)
Chloride: 106 mmol/L (ref 101–111)
GFR calc Af Amer: 28 mL/min — ABNORMAL LOW (ref >60)
GFR calc non Af Amer: 24 mL/min — ABNORMAL LOW (ref >60)
GLUCOSE: 105 mg/dL — AB (ref 65–99)
PHOSPHORUS: 4.4 mg/dL (ref 2.5–4.6)
POTASSIUM: 4.4 mmol/L (ref 3.5–5.1)
SODIUM: 145 mmol/L (ref 135–145)

## 2015-04-28 MED ORDER — EPOETIN ALFA 20000 UNIT/ML IJ SOLN
INTRAMUSCULAR | Status: AC
  Filled 2015-04-28: qty 1

## 2015-04-28 MED ORDER — EPOETIN ALFA 20000 UNIT/ML IJ SOLN
20000.0000 [IU] | INTRAMUSCULAR | Status: DC
  Administered 2015-04-28: 20000 [IU] via SUBCUTANEOUS

## 2015-05-01 ENCOUNTER — Ambulatory Visit: Payer: Self-pay | Admitting: *Deleted

## 2015-05-01 LAB — POCT HEMOGLOBIN-HEMACUE: HEMOGLOBIN: 10.5 g/dL — AB (ref 12.0–15.0)

## 2015-05-01 IMAGING — CR DG CHEST 2V
2 series · 2 of 2 positions shown · non-contrast
Comparison: PA and lateral chest of April 02, 2013 and portable
chest x-ray September 11, 2013.

CLINICAL DATA: Two months of cough and chest pain; history of COPD
and CHF.

EXAM:
CHEST  2 VIEW

[view not recorded (1 of 2)]
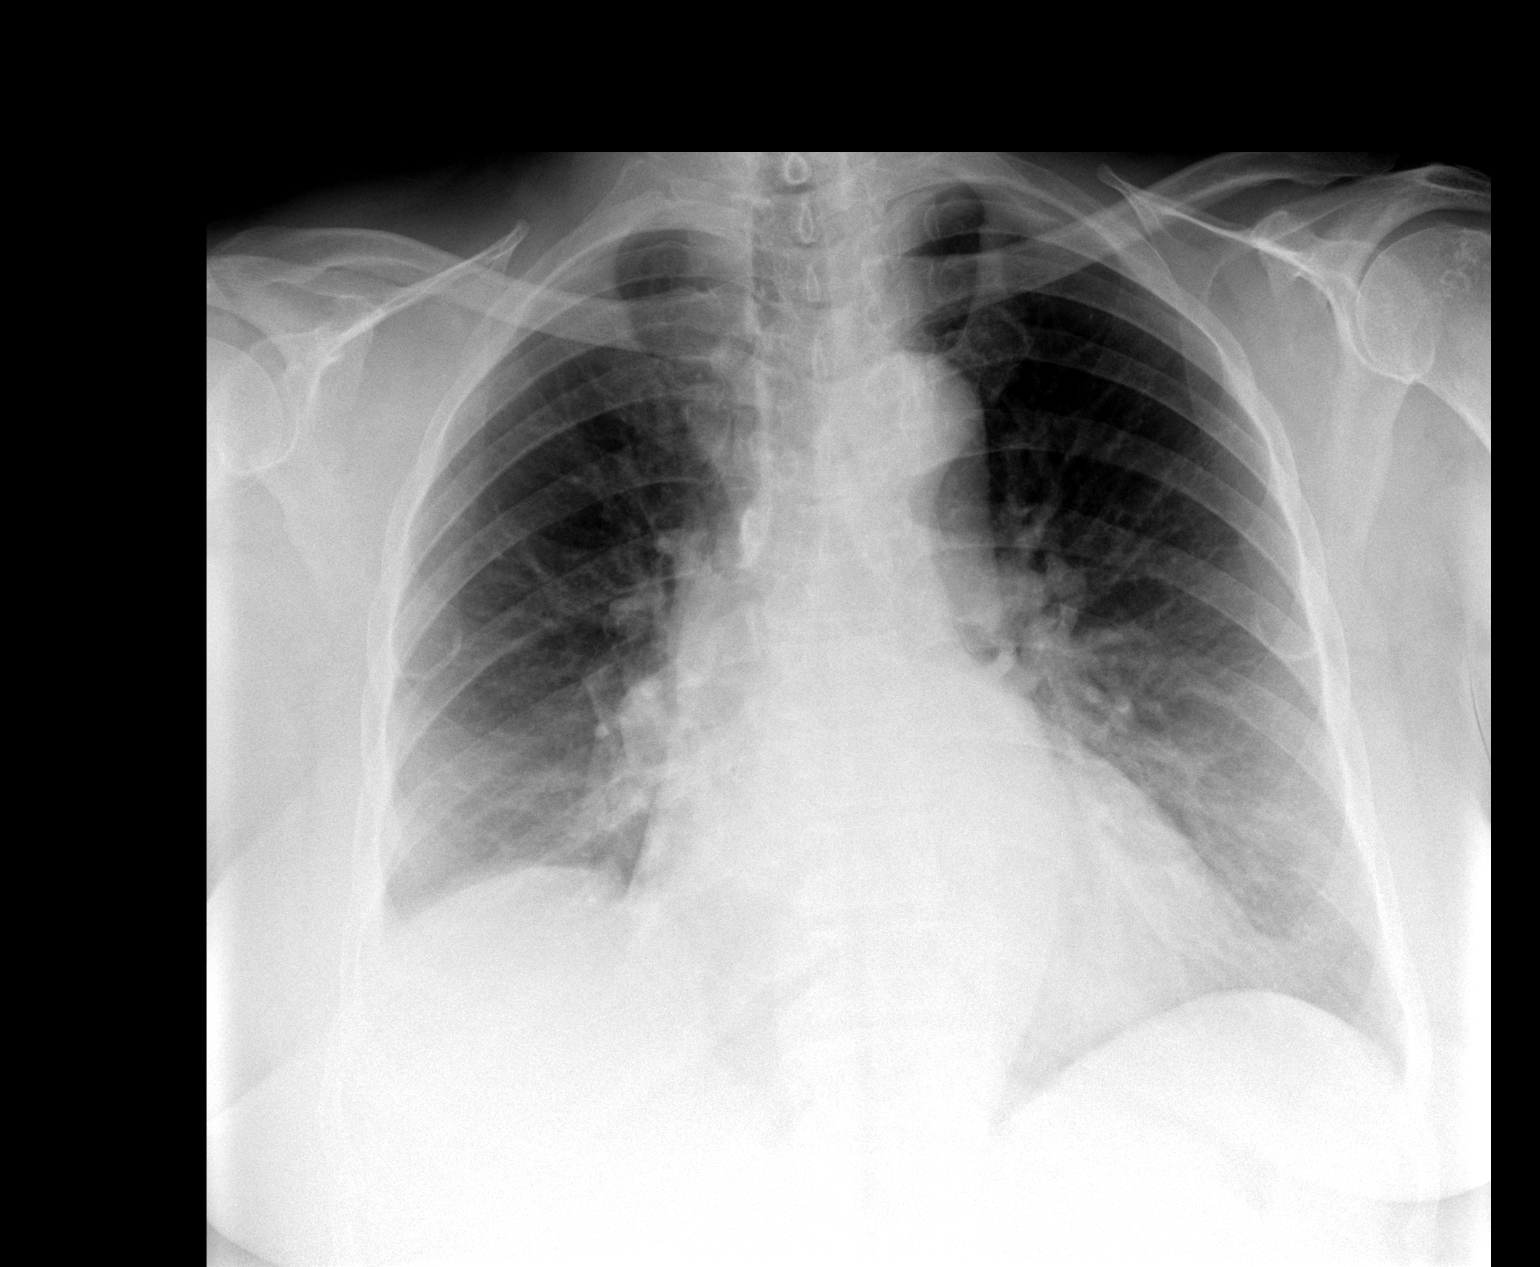

[view not recorded (2 of 2)]
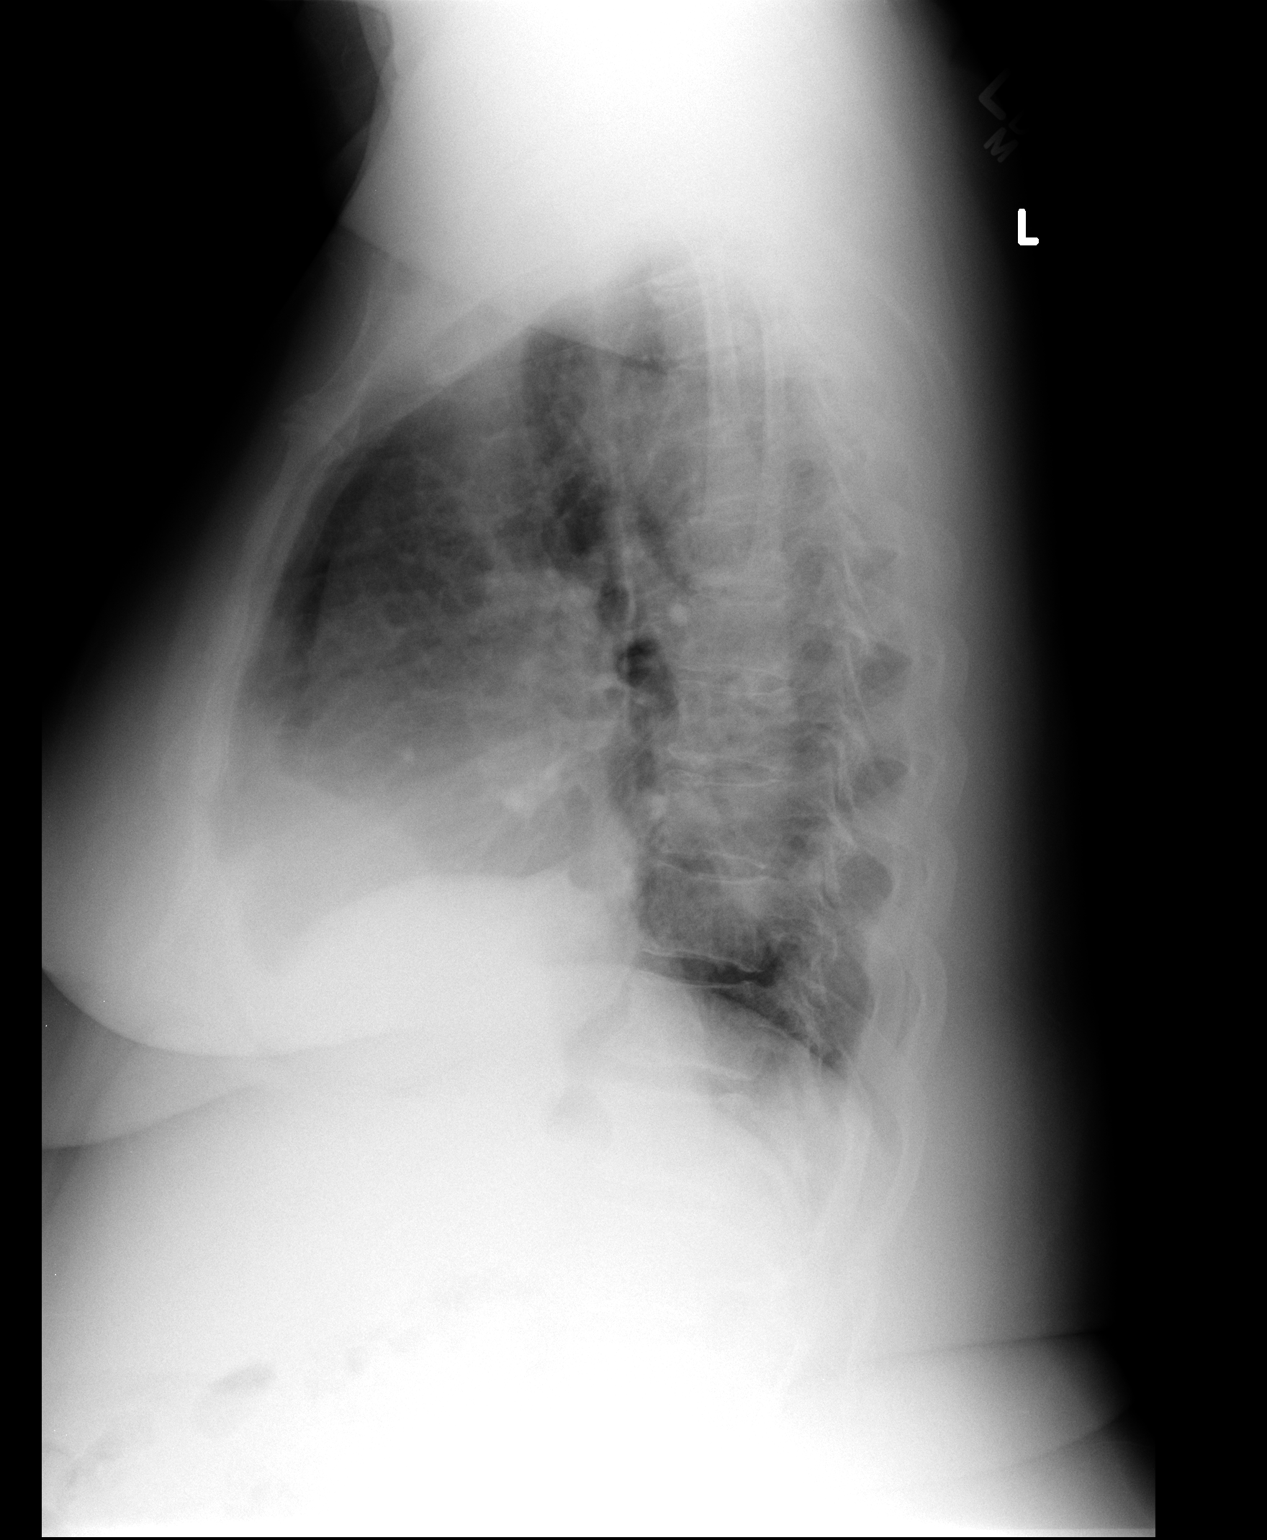

[2 of 2 positions shown; findings below may reference images not displayed]

FINDINGS: There is mild volume loss on the right secondary to chronic mild
elevation of the hemidiaphragm. There is no alveolar infiltrate. The
cardiac silhouette is enlarged. The pulmonary vascularity is
engorged and there is mild cephalization. There is no pleural
effusion or alveolar infiltrate. The bony thorax is unremarkable.
IMPRESSION: Mild CHF with pulmonary vascular congestion. There is no alveolar
pneumonia.

## 2015-05-02 ENCOUNTER — Other Ambulatory Visit: Payer: Self-pay | Admitting: Cardiology

## 2015-05-02 NOTE — Telephone Encounter (Signed)
Rx request sent to pharmacy.  

## 2015-05-05 ENCOUNTER — Encounter (HOSPITAL_COMMUNITY)
Admission: RE | Admit: 2015-05-05 | Discharge: 2015-05-05 | Disposition: A | Discharge status: Home / Self Care | Payer: Medicare Other | Source: Ambulatory Visit | Attending: Nephrology | Admitting: Nephrology

## 2015-05-05 DIAGNOSIS — N183 Chronic kidney disease, stage 3 (moderate): Secondary | ICD-10-CM | POA: Diagnosis not present

## 2015-05-05 DIAGNOSIS — D631 Anemia in chronic kidney disease: Secondary | ICD-10-CM | POA: Diagnosis not present

## 2015-05-05 LAB — POCT HEMOGLOBIN-HEMACUE: HEMOGLOBIN: 10.4 g/dL — AB (ref 12.0–15.0)

## 2015-05-05 MED ORDER — EPOETIN ALFA 20000 UNIT/ML IJ SOLN
INTRAMUSCULAR | Status: AC
  Filled 2015-05-05: qty 1

## 2015-05-05 MED ORDER — EPOETIN ALFA 20000 UNIT/ML IJ SOLN
20000.0000 [IU] | INTRAMUSCULAR | Status: DC
  Administered 2015-05-05: 20000 [IU] via SUBCUTANEOUS

## 2015-05-10 DIAGNOSIS — D638 Anemia in other chronic diseases classified elsewhere: Secondary | ICD-10-CM | POA: Diagnosis not present

## 2015-05-10 DIAGNOSIS — E889 Metabolic disorder, unspecified: Secondary | ICD-10-CM | POA: Diagnosis not present

## 2015-05-10 DIAGNOSIS — M9080 Osteopathy in diseases classified elsewhere, unspecified site: Secondary | ICD-10-CM | POA: Diagnosis not present

## 2015-05-10 DIAGNOSIS — N390 Urinary tract infection, site not specified: Secondary | ICD-10-CM | POA: Diagnosis not present

## 2015-05-10 DIAGNOSIS — I10 Essential (primary) hypertension: Secondary | ICD-10-CM | POA: Diagnosis not present

## 2015-05-10 DIAGNOSIS — N183 Chronic kidney disease, stage 3 (moderate): Secondary | ICD-10-CM | POA: Diagnosis not present

## 2015-05-12 ENCOUNTER — Other Ambulatory Visit: Payer: Self-pay | Admitting: *Deleted

## 2015-05-12 ENCOUNTER — Encounter (HOSPITAL_COMMUNITY)
Admission: RE | Admit: 2015-05-12 | Discharge: 2015-05-12 | Disposition: A | Discharge status: Home / Self Care | Payer: Medicare Other | Source: Ambulatory Visit | Attending: Nephrology | Admitting: Nephrology

## 2015-05-12 DIAGNOSIS — N183 Chronic kidney disease, stage 3 (moderate): Secondary | ICD-10-CM | POA: Diagnosis not present

## 2015-05-12 DIAGNOSIS — D631 Anemia in chronic kidney disease: Secondary | ICD-10-CM | POA: Diagnosis not present

## 2015-05-12 LAB — IRON AND TIBC
IRON: 49 ug/dL (ref 28–170)
Saturation Ratios: 19 % (ref 10.4–31.8)
TIBC: 252 ug/dL (ref 250–450)
UIBC: 203 ug/dL

## 2015-05-12 LAB — POCT HEMOGLOBIN-HEMACUE: Hemoglobin: 11.1 g/dL — ABNORMAL LOW (ref 12.0–15.0)

## 2015-05-12 LAB — FERRITIN: FERRITIN: 73 ng/mL (ref 11–307)

## 2015-05-12 MED ORDER — EPOETIN ALFA 20000 UNIT/ML IJ SOLN
INTRAMUSCULAR | Status: AC
  Filled 2015-05-12: qty 1

## 2015-05-12 MED ORDER — EPOETIN ALFA 20000 UNIT/ML IJ SOLN
20000.0000 [IU] | INTRAMUSCULAR | Status: DC
  Administered 2015-05-12: 20000 [IU] via SUBCUTANEOUS

## 2015-05-12 NOTE — Patient Outreach (Signed)
[facility]) Care Management  05/12/2015  Joann Gutierrez 03-18-1945 [id number]    Telephone call to patient; left message on voice mail requesting return call.  Plan: Will follow up and advise Health Coach.  [name] Daisy, RN BSN [place] Management Coordinator [facility] Care Management  [phone]

## 2015-05-13 LAB — PTH, INTACT AND CALCIUM
Calcium, Total (PTH): 8.8 mg/dL (ref 8.7–10.3)
PTH: 121 pg/mL — AB (ref 15–65)

## 2015-05-15 ENCOUNTER — Encounter: Payer: Self-pay | Admitting: *Deleted

## 2015-05-15 ENCOUNTER — Other Ambulatory Visit: Payer: Self-pay | Admitting: *Deleted

## 2015-05-15 NOTE — Patient Outreach (Signed)
[facility]) Care Management  05/15/2015  KHEPRI FORWOOD Mar 28, 1945 [id number]   Follow up call to patient; who gave HIPPA verification.  Patient voices that major concern now is learning more about her diabetes. States she is seeing primary care doctor every 6 months but will see more frequently if there is a need. States she gets " A1c" level every 6 months but can't remember what level was and does not know target level. States she is checking blood sugar levels 3-4 times daily.States she is using sliding scale depending on blood sugar levels.   Patient voices that she has no problems with obtaining medications or getting to MD appointments. States pharmacy delivers medications to her home. States caregiver/friend prepares her medications & takes her to MD appointments. States she is able to walk with the assistance of a cane. States she does not use salt in her foods and tries to stay away from foods with sugar. States she would like to learn about diabetic diet.   Health assessments have been started. Advised patient -Health Coach will be in contact within 2 weeks.   Plan:  Referral to Health Coach. Will advise of telephonic contact with patient. [name] Daisy, RN BSN [place] Management Coordinator [facility] Care Management  [phone]

## 2015-05-18 ENCOUNTER — Other Ambulatory Visit (HOSPITAL_COMMUNITY): Payer: Self-pay | Admitting: *Deleted

## 2015-05-19 ENCOUNTER — Encounter (HOSPITAL_COMMUNITY): Payer: Self-pay

## 2015-05-19 ENCOUNTER — Inpatient Hospital Stay (HOSPITAL_COMMUNITY)
Admission: EM | Admit: 2015-05-19 | Discharge: 2015-05-24 | DRG: 305 | Disposition: A | Discharge status: Home Health | Payer: Medicare Other | Attending: Internal Medicine | Admitting: Internal Medicine

## 2015-05-19 ENCOUNTER — Other Ambulatory Visit: Payer: Self-pay

## 2015-05-19 ENCOUNTER — Ambulatory Visit (HOSPITAL_COMMUNITY)
Admission: RE | Admit: 2015-05-19 | Discharge: 2015-05-19 | Disposition: A | Discharge status: Home / Self Care | Payer: Medicare Other | Source: Ambulatory Visit | Attending: Nephrology | Admitting: Nephrology

## 2015-05-19 DIAGNOSIS — D509 Iron deficiency anemia, unspecified: Secondary | ICD-10-CM | POA: Diagnosis present

## 2015-05-19 DIAGNOSIS — D631 Anemia in chronic kidney disease: Secondary | ICD-10-CM | POA: Insufficient documentation

## 2015-05-19 DIAGNOSIS — Z800 Family history of malignant neoplasm of digestive organs: Secondary | ICD-10-CM

## 2015-05-19 DIAGNOSIS — M25552 Pain in left hip: Secondary | ICD-10-CM | POA: Diagnosis present

## 2015-05-19 DIAGNOSIS — Z8673 Personal history of transient ischemic attack (TIA), and cerebral infarction without residual deficits: Secondary | ICD-10-CM

## 2015-05-19 DIAGNOSIS — N183 Chronic kidney disease, stage 3 unspecified: Secondary | ICD-10-CM | POA: Diagnosis present

## 2015-05-19 DIAGNOSIS — D573 Sickle-cell trait: Secondary | ICD-10-CM | POA: Diagnosis present

## 2015-05-19 DIAGNOSIS — Z832 Family history of diseases of the blood and blood-forming organs and certain disorders involving the immune mechanism: Secondary | ICD-10-CM

## 2015-05-19 DIAGNOSIS — I130 Hypertensive heart and chronic kidney disease with heart failure and stage 1 through stage 4 chronic kidney disease, or unspecified chronic kidney disease: Secondary | ICD-10-CM | POA: Diagnosis present

## 2015-05-19 DIAGNOSIS — I161 Hypertensive emergency: Principal | ICD-10-CM | POA: Diagnosis present

## 2015-05-19 DIAGNOSIS — Z6841 Body Mass Index (BMI) 40.0 and over, adult: Secondary | ICD-10-CM

## 2015-05-19 DIAGNOSIS — R109 Unspecified abdominal pain: Secondary | ICD-10-CM

## 2015-05-19 DIAGNOSIS — I10 Essential (primary) hypertension: Secondary | ICD-10-CM | POA: Diagnosis not present

## 2015-05-19 DIAGNOSIS — E1143 Type 2 diabetes mellitus with diabetic autonomic (poly)neuropathy: Secondary | ICD-10-CM | POA: Diagnosis not present

## 2015-05-19 DIAGNOSIS — N179 Acute kidney failure, unspecified: Secondary | ICD-10-CM | POA: Diagnosis not present

## 2015-05-19 DIAGNOSIS — Z87891 Personal history of nicotine dependence: Secondary | ICD-10-CM

## 2015-05-19 DIAGNOSIS — M25559 Pain in unspecified hip: Secondary | ICD-10-CM

## 2015-05-19 DIAGNOSIS — I5032 Chronic diastolic (congestive) heart failure: Secondary | ICD-10-CM | POA: Diagnosis not present

## 2015-05-19 DIAGNOSIS — E1122 Type 2 diabetes mellitus with diabetic chronic kidney disease: Secondary | ICD-10-CM | POA: Diagnosis not present

## 2015-05-19 DIAGNOSIS — R0989 Other specified symptoms and signs involving the circulatory and respiratory systems: Secondary | ICD-10-CM | POA: Diagnosis not present

## 2015-05-19 DIAGNOSIS — K219 Gastro-esophageal reflux disease without esophagitis: Secondary | ICD-10-CM | POA: Diagnosis present

## 2015-05-19 DIAGNOSIS — R51 Headache: Secondary | ICD-10-CM | POA: Diagnosis not present

## 2015-05-19 DIAGNOSIS — M810 Age-related osteoporosis without current pathological fracture: Secondary | ICD-10-CM | POA: Diagnosis present

## 2015-05-19 DIAGNOSIS — Z794 Long term (current) use of insulin: Secondary | ICD-10-CM

## 2015-05-19 DIAGNOSIS — Z808 Family history of malignant neoplasm of other organs or systems: Secondary | ICD-10-CM

## 2015-05-19 DIAGNOSIS — K3184 Gastroparesis: Secondary | ICD-10-CM | POA: Diagnosis not present

## 2015-05-19 DIAGNOSIS — Z833 Family history of diabetes mellitus: Secondary | ICD-10-CM

## 2015-05-19 DIAGNOSIS — Z5181 Encounter for therapeutic drug level monitoring: Secondary | ICD-10-CM | POA: Insufficient documentation

## 2015-05-19 DIAGNOSIS — E119 Type 2 diabetes mellitus without complications: Secondary | ICD-10-CM

## 2015-05-19 DIAGNOSIS — IMO0001 Reserved for inherently not codable concepts without codable children: Secondary | ICD-10-CM

## 2015-05-19 DIAGNOSIS — F329 Major depressive disorder, single episode, unspecified: Secondary | ICD-10-CM | POA: Diagnosis present

## 2015-05-19 DIAGNOSIS — Z79899 Other long term (current) drug therapy: Secondary | ICD-10-CM

## 2015-05-19 DIAGNOSIS — Z7982 Long term (current) use of aspirin: Secondary | ICD-10-CM

## 2015-05-19 DIAGNOSIS — R030 Elevated blood-pressure reading, without diagnosis of hypertension: Secondary | ICD-10-CM | POA: Diagnosis not present

## 2015-05-19 DIAGNOSIS — E785 Hyperlipidemia, unspecified: Secondary | ICD-10-CM | POA: Diagnosis present

## 2015-05-19 DIAGNOSIS — Z8249 Family history of ischemic heart disease and other diseases of the circulatory system: Secondary | ICD-10-CM

## 2015-05-19 DIAGNOSIS — E1169 Type 2 diabetes mellitus with other specified complication: Secondary | ICD-10-CM

## 2015-05-19 DIAGNOSIS — Z7951 Long term (current) use of inhaled steroids: Secondary | ICD-10-CM

## 2015-05-19 DIAGNOSIS — R05 Cough: Secondary | ICD-10-CM | POA: Diagnosis not present

## 2015-05-19 DIAGNOSIS — R519 Headache, unspecified: Secondary | ICD-10-CM

## 2015-05-19 DIAGNOSIS — E876 Hypokalemia: Secondary | ICD-10-CM | POA: Diagnosis present

## 2015-05-19 DIAGNOSIS — E7800 Pure hypercholesterolemia, unspecified: Secondary | ICD-10-CM | POA: Diagnosis present

## 2015-05-19 LAB — POCT HEMOGLOBIN-HEMACUE: Hemoglobin: 11.2 g/dL — ABNORMAL LOW (ref 12.0–15.0)

## 2015-05-19 MED ORDER — EPOETIN ALFA 20000 UNIT/ML IJ SOLN
20000.0000 [IU] | INTRAMUSCULAR | Status: DC
  Administered 2015-05-19: 20000 [IU] via SUBCUTANEOUS

## 2015-05-19 MED ORDER — SODIUM CHLORIDE 0.9 % IV SOLN
510.0000 mg | INTRAVENOUS | Status: DC
Start: 2015-05-19 — End: 2015-05-20
  Administered 2015-05-19: 510 mg via INTRAVENOUS
  Filled 2015-05-19: qty 17

## 2015-05-19 MED ORDER — EPOETIN ALFA 20000 UNIT/ML IJ SOLN
INTRAMUSCULAR | Status: AC
  Administered 2015-05-19: 20000 [IU] via SUBCUTANEOUS
  Filled 2015-05-19: qty 1

## 2015-05-19 NOTE — ED Notes (Signed)
Patient here for weakness by gc ems, pt reports vomiting x 1 pta by ems, pt htn with ems 245/130, pt reporting headache alert and oriented. Given 4 zofran by ems

## 2015-05-19 NOTE — ED Provider Notes (Signed)
CSN: [id number]     Arrival date & time 05/19/15  2305 History  By signing my name below, I, Joann Gutierrez, attest that this documentation has been prepared under the direction and in the presence of Varney Biles, MD. Electronically Signed: Randa Gutierrez, ED Scribe. 05/20/2015. 3:09 AM.    Chief Complaint  Patient presents with  . Weakness  . Hypertension  . Headache    Patient is a 70 y.o. female presenting with weakness, hypertension, and headaches. The history is provided by the patient. No language interpreter was used.  Weakness Associated symptoms include headaches.  Hypertension Associated symptoms include headaches.  Headache Associated symptoms: weakness    HPI Comments: Joann Gutierrez is a 70 y.o. female who presents to the Emergency Department complaining of HA onset today at 9:30 AM. Pt reports that the HA is mostly posterior and behind her right eye. Pt reports this feels like previous HA but this one is more severe. She reports associated photophobia, phonophobia, nausea and vomiting. Pt also reports generalized weakness and SOB. Pt has had Zofran in route with relief of the nausea and vomiting. Pt has tried aspirin and tramadol with no relief. Pt doesn't report any worsening symptoms. Pt denies visual disturbance, CP, numbness, neck pain or neck stiffness. Pt reports Hx of stroke with no residual deficits.   Past Medical History  Diagnosis Date  . Allergy   . Depression   . Diabetes mellitus 1997    Type II   . GERD (gastroesophageal reflux disease)   . Hypertension   . Hyperlipidemia   . Osteoarthritis   . Osteoporosis   . Hair loss   . Anemia   . CAD (coronary artery disease)     Mild very minimal coronary disease with 20% obtuse marginal stenosis  . CVA (cerebral infarction)   . Diverticulosis   . Esophageal stricture   . Gastritis   . Adenomatous colon polyp   . Sickle cell trait ([place])   . Asthma        . COPD (chronic obstructive pulmonary  disease) ([place])   . Secondary pulmonary hypertension ([place]) 03/07/2009  . Gastroparesis 08/21/2007  . RENAL INSUFFICIENCY 02/16/2009  . OSTEOARTHRITIS 08/09/2006  . Morbid obesity ([place])   . CHF (congestive heart failure) ([place])   . Carpal tunnel syndrome on left   . 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 Hernia, hiatal   . Seizures ([place])     pt thinks it has been several monthes since she had a seisure  . Stroke ([place])   . Hearing loss of both ears   . 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 Chronic kidney disease (CKD), stage III (moderate)    Past Surgical History  Procedure Laterality Date  . Abdominal hysterectomy    . Replacement total knee Left 1998  . Esophagogastroduodenoscopy  06/25/2006  . Erd  08/08/2000  . Artery biopsy  01/07/2011    Procedure: MINOR BIOPSY TEMPORAL ARTERY;  Surgeon: Haywood Lasso, MD;  Location: [facility];  Service: General;  Laterality: Left;  left temporal artery biopsy  . Breast lumpectomy      benign  . Breast lumpectomy      both breast lumps removed   . Hernia repair    . Colonoscopy    . Upper gastrointestinal endoscopy    . Bil foot surgery    . Left and right heart catheterization with coronary angiogram N/A 03/03/2013    Procedure: LEFT  AND RIGHT HEART CATHETERIZATION WITH CORONARY ANGIOGRAM;  Surgeon: Minus Breeding, MD;  Location: [facility] CATH LAB;  Service: Cardiovascular;  Laterality: N/A;  . Esophageal manometry N/A 03/13/2015    Procedure: ESOPHAGEAL MANOMETRY (EM);  Surgeon: Manus Gunning, MD;  Location: [facility] ENDOSCOPY;  Service: Gastroenterology;  Laterality: N/A;   Family History  Problem Relation Age of Onset  . Colon cancer Brother   . Cancer Brother     Colon Cancer  . Liver cancer Mother     Liver Cancer  . Diabetes Mother   . Kidney disease Mother   . Heart disease Mother     age 43's  . Heart disease Father 30    MI  . Heart attack Father     died of MI when pt was 74  . Sickle cell  anemia Father   . Diabetes Sister   . Kidney disease Sister   . Heart disease Sister     age 57's  . Allergies Sister   . Diabetes Sister   . Kidney disease Sister   . Heart disease Sister     age 34's  . Esophageal cancer Neg Hx   . Rectal cancer Neg Hx   . Stomach cancer Neg Hx    Social History  Substance Use Topics  . Smoking status: Former Smoker -- 0.50 packs/day for 10 years    Quit date: 04/18/1980  . Smokeless tobacco: Never Used  . Alcohol Use: No   OB History    No data available     Review of Systems  Neurological: Positive for weakness and headaches.   A complete 10 system review of systems was obtained and all systems are negative except as noted in the HPI and PMH.     Allergies  Promethazine hcl and Morphine and related  Home Medications   Prior to Admission medications   Medication Sig Start Date End Date Taking? Authorizing Provider  albuterol (VENTOLIN HFA) 108 (90 BASE) MCG/ACT inhaler Inhale 2 puffs into the lungs every 6 (six) hours as needed for shortness of breath. 11/10/14  Yes [name], MD  aspirin 325 MG tablet Take 325 mg by mouth daily.    Yes Historical Provider, MD  aspirin 81 MG tablet Take 81 mg by mouth daily.   Yes Historical Provider, MD  atorvastatin (LIPITOR) 40 MG tablet Take 1 tablet (40 mg total) by mouth daily. Patient taking differently: Take 40 mg by mouth at bedtime.  09/07/10  Yes Minus Breeding, MD  Calcium Carbonate-Vit D-Min (CALTRATE PLUS PO) Take 600 mg by mouth every morning.    Yes Historical Provider, MD  [facility] Liver Oil CAPS Take 1 capsule by mouth daily.   Yes Historical Provider, MD  cyclobenzaprine (FLEXERIL) 10 MG tablet Take 10 mg by mouth at bedtime. Reported on 02/07/2015   Yes Historical Provider, MD  dextromethorphan-guaiFENesin (ROBITUSSIN-DM) 10-100 MG/5ML liquid Take 10 mLs by mouth every 4 (four) hours as needed for cough. Reported on 01/25/2015   Yes Historical Provider, MD  donepezil (ARICEPT) 10 MG  tablet Take 10 mg by mouth at bedtime.    Yes Historical Provider, MD  doxazosin (CARDURA) 2 MG tablet Take 1 tablet by mouth at bedtime. 10/10/14  Yes Historical Provider, MD  ezetimibe (ZETIA) 10 MG tablet Take 10 mg by mouth at bedtime.    Yes Historical Provider, MD  ferrous sulfate 325 (65 FE) MG EC tablet Take 325 mg by mouth every morning. Reported on 01/25/2015   Yes  Historical Provider, MD  fluticasone (FLONASE) 50 MCG/ACT nasal spray Place 2 sprays into both nostrils daily. 11/10/14 08/27/17 Yes [name], MD  furosemide (LASIX) 80 MG tablet Take 1 tablet by mouth 2 (two) times daily. 07/12/14  Yes Historical Provider, MD  gabapentin (NEURONTIN) 300 MG capsule Take 1 capsule (300 mg total) by mouth 2 (two) times daily. 04/29/11  Yes Renato Shin, MD  hydrALAZINE (APRESOLINE) 100 MG tablet Take 100 mg by mouth 3 (three) times daily.    Yes Historical Provider, MD  insulin glargine (LANTUS) 100 UNIT/ML injection Inject 0.15 mLs (15 Units total) into the skin daily. Patient taking differently: Inject 30 Units into the skin daily.  09/18/13  Yes Maryann Mikhail, DO  insulin NPH (HUMULIN N) 100 UNIT/ML injection Inject 2-13 Units into the skin 3 (three) times daily after meals. Reported on 01/25/2015 07/07/12  Yes Renato Shin, MD  isosorbide mononitrate (IMDUR) 120 MG 24 hr tablet Take 1 tablet (120 mg total) by mouth daily. PATIENT NEEDS TO CONTACT OFFICE FOR ADDITIONAL REFILLS 01/11/15  Yes Minus Breeding, MD  KLOR-CON M20 20 MEQ tablet Take 1 tablet by mouth daily.  06/19/11  Yes Historical Provider, MD  LIDODERM 5 % Place 1 patch onto the skin daily as needed (pain). Reported on 02/07/2015 10/20/11  Yes Historical Provider, MD  losartan (COZAAR) 100 MG tablet Take 1 tablet by mouth every morning.  09/12/14  Yes Historical Provider, MD  metoCLOPramide (REGLAN) 10 MG tablet TAKE ONE TABLET BY MOUTH BEFORE MEALS AND AT BEDTIME 04/04/15  Yes Tanda Rockers, MD  metolazone (ZAROXOLYN) 2.5 MG tablet Take 2.5 mg  by mouth 3 (three) times a week. Take 1 tablet by mouth on Monday, Wednesday and Friday 10/10/14  Yes Historical Provider, MD  montelukast (SINGULAIR) 10 MG tablet Take 1 tablet (10 mg total) by mouth daily. 11/10/14  Yes [name], MD  Multiple Vitamins-Minerals (MULTIVITAMIN GUMMIES ADULT) CHEW Chew 1 capsule by mouth daily.   Yes Historical Provider, MD  nitroGLYCERIN (NITROSTAT) 0.4 MG SL tablet PLACE ONE TABLET UNDER THE TONGUE EVERY FIVE MINUTES AS NEEDED FOR CHEST PAIN 05/02/15  Yes Minus Breeding, MD  Omega-3 Fatty Acids (FISH OIL) 1000 MG CAPS Take 1 capsule by mouth daily.   Yes Historical Provider, MD  omeprazole (PRILOSEC) 40 MG capsule Take one po daily 11/07/14  Yes Jessica D Zehr, PA-C  OXYGEN Inhale into the lungs. CPAP with oxygen at bedtime   Yes Historical Provider, MD  raloxifene (EVISTA) 60 MG tablet Take 60 mg by mouth every morning.    Yes Historical Provider, MD  sucralfate (CARAFATE) 1 GM/10ML suspension Take 10 mLs (1 g total) by mouth 2 (two) times daily. 07/07/12  Yes Inda Castle, MD  SYMBICORT 160-4.5 MCG/ACT inhaler INHALE 2 PUFFS INTO LUNGS 2 TIMES DAILY 04/04/15  Yes Tammy S Parrett, NP  traMADol (ULTRAM) 50 MG tablet Take 50 mg by mouth every 6 (six) hours as needed for moderate pain. Reported on 02/07/2015   Yes Historical Provider, MD  Vitamin D, Ergocalciferol, (DRISDOL) 50000 UNITS CAPS capsule Take 50,000 Units by mouth every Monday.    Yes Historical Provider, MD   BP 183/81 mmHg  Pulse 106  Temp(Src) 98.5 F (36.9 C)  Resp 16  Ht 5\' 4"  (1.626 m)  Wt 285 lb (129.275 kg)  BMI 48.90 kg/m2  SpO2 98%   Physical Exam  Constitutional: She is oriented to person, place, and time. She appears well-developed and well-nourished. No distress.  HENT:  Head: Normocephalic and atraumatic.  Eyes: Conjunctivae are normal. Pupils are equal, round, and reactive to light.  Pupils 46mm and equal  Neck: Neck supple. No tracheal deviation present.  Cardiovascular: Normal  rate, regular rhythm and normal heart sounds.   Pulmonary/Chest: Effort normal. No respiratory distress.  Musculoskeletal: Normal range of motion.  Neurological: She is alert and oriented to person, place, and time.  Cranial nerve 2-12 intact. Slight right sided drift in upper extremity. 3/5 strength bilateral in lower extremities  Skin: Skin is warm and dry.  Psychiatric: She has a normal mood and affect. Her behavior is normal.  Nursing note and vitals reviewed.   ED Course  Procedures (including critical care time) DIAGNOSTIC STUDIES: Oxygen Saturation is 95% on RA, adequate by my interpretation.    COORDINATION OF CARE: 12:02 AM-Discussed treatment plan with pt at bedside and pt agreed to plan.     Labs Review Labs Reviewed  BASIC METABOLIC PANEL - Abnormal; Notable for the following:    Potassium 3.3 (*)    Glucose, Bld 227 (*)    BUN 55 (*)    Creatinine, Ser 1.67 (*)    GFR calc non Af Amer 30 (*)    GFR calc Af Amer 35 (*)    All other components within normal limits  CBC - Abnormal; Notable for the following:    Hemoglobin 11.4 (*)    MCV 72.1 (*)    MCH 22.6 (*)    RDW 16.2 (*)    All other components within normal limits  URINALYSIS, ROUTINE W REFLEX MICROSCOPIC (NOT AT [facility]) - Abnormal; Notable for the following:    Glucose, UA 100 (*)    Protein, ur 100 (*)    All other components within normal limits  TROPONIN I - Abnormal; Notable for the following:    Troponin I 0.05 (*)    All other components within normal limits  BRAIN NATRIURETIC PEPTIDE - Abnormal; Notable for the following:    B Natriuretic Peptide 119.4 (*)    All other components within normal limits  URINE MICROSCOPIC-ADD ON - Abnormal; Notable for the following:    Squamous Epithelial / LPF 0-5 (*)    Bacteria, UA RARE (*)    All other components within normal limits  CBG MONITORING, ED - Abnormal; Notable for the following:    Glucose-Capillary 212 (*)    All other components within  normal limits    Imaging Review Dg Chest 2 View  05/20/2015  CLINICAL DATA:  Acute onset of generalized weakness, shortness of breath and headache. Cough and dyspnea. Vomiting. Initial encounter. EXAM: CHEST  2 VIEW COMPARISON:  Chest radiograph performed 09/22/2014 FINDINGS: The lungs are well-aerated. Mild vascular congestion is noted. Mildly increased interstitial markings may reflect minimal interstitial edema. There is no evidence of pleural effusion or pneumothorax. The heart is mildly enlarged. No acute osseous abnormalities are seen. IMPRESSION: Mild vascular congestion and mild cardiomegaly. Mildly increased interstitial markings may reflect minimal interstitial edema. Electronically Signed   By: Garald Balding M.D.   On: 05/20/2015 01:44   Ct Head Wo Contrast  05/20/2015  CLINICAL DATA:  Severe posterior headache. History of seizures and stroke, diabetes, hypertension, hyperlipidemia. EXAM: CT HEAD WITHOUT CONTRAST TECHNIQUE: Contiguous axial images were obtained from the base of the skull through the vertex without intravenous contrast. COMPARISON:  CT head November 02, 2014 FINDINGS: INTRACRANIAL CONTENTS: The ventricles and sulci are normal for age. No intraparenchymal hemorrhage, mass effect nor midline  shift. Patchy to come supratentorial white matter hypodensities are relatively unchanged. Old Small LEFT cerebellar infarct. No acute large vascular territory infarcts. No abnormal extra-axial fluid collections. Basal cisterns are patent. Mild calcific atherosclerosis of the carotid siphons. ORBITS: The included ocular globes and orbital contents are non-suspicious. SINUSES: The mastoid aircells and included paranasal sinuses are well-aerated. SKULL/SOFT TISSUES: No skull fracture. No significant soft tissue swelling. Patient is edentulous. IMPRESSION: No acute intracranial process. 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 Electronically  Signed   By: Elon Alas M.D.   On: 05/20/2015 01:51   I have personally reviewed and evaluated these lab results as part of my medical decision-making.   EKG Interpretation   Date/Time:  Friday May 19 2015 23:17:03 EDT Ventricular Rate:  82 PR Interval:  161 QRS Duration: 91 QT Interval:  385 QTC Calculation: 450 R Axis:   15 Text Interpretation:  Sinus rhythm Probable left atrial enlargement Left  ventricular hypertrophy Anteroseptal infarct, old normal intervals   Nonspecific ST elevation - anterior leads  No significant change since  last tracing Confirmed by Kathrynn Humble, MD, Thelma Comp [phone]) on 05/20/2015 2:14:44  AM      MDM   Final diagnoses:  Hypertensive emergency  Pulmonary congestion  Intractable headache, unspecified chronicity pattern, unspecified headache type      I personally performed the services described in this documentation, which was scribed in my presence. The recorded information has been reviewed and is accurate.  CRITICAL CARE Performed by: Varney Biles   Total critical care time: 36  Minutes - HTN emergency with iv meds.  Critical care time was exclusive of separately billable procedures and treating other patients.  Critical care was necessary to treat or prevent imminent or life-threatening deterioration.  Critical care was time spent personally by me on the following activities: development of treatment plan with patient and/or surrogate as well as nursing, discussions with consultants, evaluation of patient's response to treatment, examination of patient, obtaining history from patient or surrogate, ordering and performing treatments and interventions, ordering and review of laboratory studies, ordering and review of radiographic studies, pulse oximetry and re-evaluation of patient's condition.    Pt with cc of headaches and weakness. Questionable stroke in the past, along with DM, HTN, CAD, CKD hx. She is noted to be hypertensive  in the ER. Neuro exam is non-focal. Head is severe per patient, aspirin taken at home with no relief. Hx of same type of headaches, just not as severe.  DDX includes: Primary headaches - including migrainous headaches, cluster headaches, tension headaches. ICH Cavernous sinus thrombosis Meningitis / Encephalitis Tumor Vascular headaches AV malformation Brain aneurysm Muscular headaches  A/P: We will have to get CT head to ensure there is no intracranial hemorrhage. Neuro exam is non focal, which is reassuring. If CT is neg, we will give headache cocktail. Will reassess to see if she needs further imaging.  Pt also c/o dib. Lungs on anterior exam are clear, no chest pain. CXR ordered. She has hx of pulm HTN and COPD + CHF, CAD. trops and bnp ordered.  3:09 AM CXR shows vascular congestion, trop is slightly elevated, MAP is 118. Pt meets criteria for HTN emergency. Hospitalist comfortable with admitting to SNF. Headache still present - but improved. Might need CT-angio if the headache persists if there are concerns for brain AN. Currently, SAH suspicion is low, as this is the headache she normally gets, just more severe. Pt also has  pulm HTN. So the hypoxia and tachycardia theoretically puts her at high risk for PE - if she doesn't improve on the HR or has persistent dyspnea, might need CT PE.   Varney Biles, MD 05/20/15 [phone]

## 2015-05-20 ENCOUNTER — Emergency Department (HOSPITAL_COMMUNITY): Payer: Medicare Other

## 2015-05-20 ENCOUNTER — Encounter (HOSPITAL_COMMUNITY): Payer: Self-pay | Admitting: Family Medicine

## 2015-05-20 DIAGNOSIS — F329 Major depressive disorder, single episode, unspecified: Secondary | ICD-10-CM | POA: Diagnosis present

## 2015-05-20 DIAGNOSIS — E876 Hypokalemia: Secondary | ICD-10-CM | POA: Diagnosis present

## 2015-05-20 DIAGNOSIS — K219 Gastro-esophageal reflux disease without esophagitis: Secondary | ICD-10-CM | POA: Diagnosis present

## 2015-05-20 DIAGNOSIS — R51 Headache: Secondary | ICD-10-CM | POA: Diagnosis present

## 2015-05-20 DIAGNOSIS — N183 Chronic kidney disease, stage 3 (moderate): Secondary | ICD-10-CM | POA: Diagnosis not present

## 2015-05-20 DIAGNOSIS — Z8249 Family history of ischemic heart disease and other diseases of the circulatory system: Secondary | ICD-10-CM | POA: Diagnosis not present

## 2015-05-20 DIAGNOSIS — Z87891 Personal history of nicotine dependence: Secondary | ICD-10-CM | POA: Diagnosis not present

## 2015-05-20 DIAGNOSIS — K3184 Gastroparesis: Secondary | ICD-10-CM | POA: Diagnosis not present

## 2015-05-20 DIAGNOSIS — M25552 Pain in left hip: Secondary | ICD-10-CM | POA: Diagnosis not present

## 2015-05-20 DIAGNOSIS — D509 Iron deficiency anemia, unspecified: Secondary | ICD-10-CM | POA: Diagnosis present

## 2015-05-20 DIAGNOSIS — Z8673 Personal history of transient ischemic attack (TIA), and cerebral infarction without residual deficits: Secondary | ICD-10-CM | POA: Diagnosis not present

## 2015-05-20 DIAGNOSIS — R05 Cough: Secondary | ICD-10-CM | POA: Diagnosis not present

## 2015-05-20 DIAGNOSIS — E1143 Type 2 diabetes mellitus with diabetic autonomic (poly)neuropathy: Secondary | ICD-10-CM | POA: Diagnosis present

## 2015-05-20 DIAGNOSIS — N179 Acute kidney failure, unspecified: Secondary | ICD-10-CM | POA: Insufficient documentation

## 2015-05-20 DIAGNOSIS — Z79899 Other long term (current) drug therapy: Secondary | ICD-10-CM | POA: Diagnosis not present

## 2015-05-20 DIAGNOSIS — Z794 Long term (current) use of insulin: Secondary | ICD-10-CM

## 2015-05-20 DIAGNOSIS — E1122 Type 2 diabetes mellitus with diabetic chronic kidney disease: Secondary | ICD-10-CM | POA: Diagnosis present

## 2015-05-20 DIAGNOSIS — I10 Essential (primary) hypertension: Secondary | ICD-10-CM

## 2015-05-20 DIAGNOSIS — I161 Hypertensive emergency: Secondary | ICD-10-CM | POA: Diagnosis not present

## 2015-05-20 DIAGNOSIS — Z832 Family history of diseases of the blood and blood-forming organs and certain disorders involving the immune mechanism: Secondary | ICD-10-CM | POA: Diagnosis not present

## 2015-05-20 DIAGNOSIS — Z800 Family history of malignant neoplasm of digestive organs: Secondary | ICD-10-CM | POA: Diagnosis not present

## 2015-05-20 DIAGNOSIS — R109 Unspecified abdominal pain: Secondary | ICD-10-CM | POA: Diagnosis not present

## 2015-05-20 DIAGNOSIS — Z7982 Long term (current) use of aspirin: Secondary | ICD-10-CM | POA: Diagnosis not present

## 2015-05-20 DIAGNOSIS — Z6841 Body Mass Index (BMI) 40.0 and over, adult: Secondary | ICD-10-CM | POA: Diagnosis not present

## 2015-05-20 DIAGNOSIS — E7800 Pure hypercholesterolemia, unspecified: Secondary | ICD-10-CM | POA: Diagnosis present

## 2015-05-20 DIAGNOSIS — M810 Age-related osteoporosis without current pathological fracture: Secondary | ICD-10-CM | POA: Diagnosis present

## 2015-05-20 DIAGNOSIS — Z833 Family history of diabetes mellitus: Secondary | ICD-10-CM | POA: Diagnosis not present

## 2015-05-20 DIAGNOSIS — Z808 Family history of malignant neoplasm of other organs or systems: Secondary | ICD-10-CM | POA: Diagnosis not present

## 2015-05-20 DIAGNOSIS — E119 Type 2 diabetes mellitus without complications: Secondary | ICD-10-CM

## 2015-05-20 DIAGNOSIS — E785 Hyperlipidemia, unspecified: Secondary | ICD-10-CM | POA: Diagnosis present

## 2015-05-20 DIAGNOSIS — I130 Hypertensive heart and chronic kidney disease with heart failure and stage 1 through stage 4 chronic kidney disease, or unspecified chronic kidney disease: Secondary | ICD-10-CM | POA: Diagnosis present

## 2015-05-20 DIAGNOSIS — I360 Nonrheumatic tricuspid (valve) stenosis: Secondary | ICD-10-CM | POA: Diagnosis not present

## 2015-05-20 DIAGNOSIS — D573 Sickle-cell trait: Secondary | ICD-10-CM | POA: Diagnosis present

## 2015-05-20 DIAGNOSIS — Z7951 Long term (current) use of inhaled steroids: Secondary | ICD-10-CM | POA: Diagnosis not present

## 2015-05-20 DIAGNOSIS — I5032 Chronic diastolic (congestive) heart failure: Secondary | ICD-10-CM | POA: Diagnosis present

## 2015-05-20 LAB — URINALYSIS, ROUTINE W REFLEX MICROSCOPIC
Bilirubin Urine: NEGATIVE
Glucose, UA: 100 mg/dL — AB
Hgb urine dipstick: NEGATIVE
Ketones, ur: NEGATIVE mg/dL
LEUKOCYTES UA: NEGATIVE
NITRITE: NEGATIVE
PH: 5 (ref 5.0–8.0)
Protein, ur: 100 mg/dL — AB
SPECIFIC GRAVITY, URINE: 1.016 (ref 1.005–1.030)

## 2015-05-20 LAB — BASIC METABOLIC PANEL
ANION GAP: 12 (ref 5–15)
Anion gap: 9 (ref 5–15)
BUN: 55 mg/dL — AB (ref 6–20)
BUN: 55 mg/dL — ABNORMAL HIGH (ref 6–20)
CHLORIDE: 106 mmol/L (ref 101–111)
CHLORIDE: 103 mmol/L (ref 101–111)
CO2: 27 mmol/L (ref 22–32)
CO2: 27 mmol/L (ref 22–32)
CREATININE: 2.98 mg/dL — AB (ref 0.44–1.00)
Calcium: 8.7 mg/dL — ABNORMAL LOW (ref 8.9–10.3)
Calcium: 9.4 mg/dL (ref 8.9–10.3)
Creatinine, Ser: 1.67 mg/dL — ABNORMAL HIGH (ref 0.44–1.00)
GFR calc Af Amer: 17 mL/min — ABNORMAL LOW (ref >60)
GFR calc Af Amer: 35 mL/min — ABNORMAL LOW (ref >60)
GFR calc non Af Amer: 15 mL/min — ABNORMAL LOW (ref >60)
GFR, EST NON AFRICAN AMERICAN: 30 mL/min — AB (ref >60)
GLUCOSE: 95 mg/dL (ref 65–99)
GLUCOSE: 227 mg/dL — AB (ref 65–99)
POTASSIUM: 3.5 mmol/L (ref 3.5–5.1)
POTASSIUM: 3.3 mmol/L — AB (ref 3.5–5.1)
SODIUM: 142 mmol/L (ref 135–145)
Sodium: 142 mmol/L (ref 135–145)

## 2015-05-20 LAB — CBC
HEMATOCRIT: 32.9 % — AB (ref 36.0–46.0)
HEMATOCRIT: 36.4 % (ref 36.0–46.0)
HEMOGLOBIN: 11.4 g/dL — AB (ref 12.0–15.0)
Hemoglobin: 10.1 g/dL — ABNORMAL LOW (ref 12.0–15.0)
MCH: 22.2 pg — AB (ref 26.0–34.0)
MCH: 22.6 pg — ABNORMAL LOW (ref 26.0–34.0)
MCHC: 30.7 g/dL (ref 30.0–36.0)
MCHC: 31.3 g/dL (ref 30.0–36.0)
MCV: 72.3 fL — AB (ref 78.0–100.0)
MCV: 72.1 fL — AB (ref 78.0–100.0)
PLATELETS: 210 10*3/uL (ref 150–400)
Platelets: 240 10*3/uL (ref 150–400)
RBC: 4.55 MIL/uL (ref 3.87–5.11)
RBC: 5.05 MIL/uL (ref 3.87–5.11)
RDW: 16.1 % — AB (ref 11.5–15.5)
RDW: 16.2 % — ABNORMAL HIGH (ref 11.5–15.5)
WBC: 10.7 10*3/uL — ABNORMAL HIGH (ref 4.0–10.5)
WBC: 7.8 10*3/uL (ref 4.0–10.5)

## 2015-05-20 LAB — URINE MICROSCOPIC-ADD ON

## 2015-05-20 LAB — MAGNESIUM: Magnesium: 2.2 mg/dL (ref 1.7–2.4)

## 2015-05-20 LAB — TROPONIN I
TROPONIN I: 0.04 ng/mL — AB (ref <0.031)
TROPONIN I: 0.05 ng/mL — AB (ref <0.031)
TROPONIN I: 0.05 ng/mL — AB (ref <0.031)
Troponin I: 0.08 ng/mL — ABNORMAL HIGH (ref <0.031)

## 2015-05-20 LAB — TSH: TSH: 1.555 u[IU]/mL (ref 0.350–4.500)

## 2015-05-20 LAB — GLUCOSE, CAPILLARY
GLUCOSE-CAPILLARY: 142 mg/dL — AB (ref 65–99)
Glucose-Capillary: 237 mg/dL — ABNORMAL HIGH (ref 65–99)
Glucose-Capillary: 201 mg/dL — ABNORMAL HIGH (ref 65–99)
Glucose-Capillary: 100 mg/dL — ABNORMAL HIGH (ref 65–99)

## 2015-05-20 LAB — CBG MONITORING, ED: Glucose-Capillary: 212 mg/dL — ABNORMAL HIGH (ref 65–99)

## 2015-05-20 LAB — MRSA PCR SCREENING: MRSA BY PCR: NEGATIVE

## 2015-05-20 LAB — BRAIN NATRIURETIC PEPTIDE: B NATRIURETIC PEPTIDE 5: 119.4 pg/mL — AB (ref 0.0–100.0)

## 2015-05-20 LAB — T4, FREE: FREE T4: 0.98 ng/dL (ref 0.61–1.12)

## 2015-05-20 MED ORDER — TRAMADOL HCL 50 MG PO TABS
50.0000 mg | ORAL_TABLET | Freq: Four times a day (QID) | ORAL | Status: DC | PRN
  Administered 2015-05-21 – 2015-05-23 (×3): 50 mg via ORAL
  Filled 2015-05-20 (×3): qty 1

## 2015-05-20 MED ORDER — INSULIN ASPART 100 UNIT/ML ~~LOC~~ SOLN
0.0000 [IU] | Freq: Three times a day (TID) | SUBCUTANEOUS | Status: DC
  Administered 2015-05-20 (×2): 5 [IU] via SUBCUTANEOUS
  Administered 2015-05-21 (×2): 3 [IU] via SUBCUTANEOUS
  Administered 2015-05-21: 2 [IU] via SUBCUTANEOUS
  Administered 2015-05-22: 3 [IU] via SUBCUTANEOUS
  Administered 2015-05-22 (×2): 2 [IU] via SUBCUTANEOUS
  Administered 2015-05-23 (×2): 3 [IU] via SUBCUTANEOUS

## 2015-05-20 MED ORDER — NITROGLYCERIN 2 % TD OINT
1.0000 [in_us] | TOPICAL_OINTMENT | Freq: Four times a day (QID) | TRANSDERMAL | Status: DC
  Administered 2015-05-20: 1 [in_us] via TOPICAL
  Filled 2015-05-20: qty 30

## 2015-05-20 MED ORDER — FUROSEMIDE 10 MG/ML IJ SOLN
40.0000 mg | Freq: Two times a day (BID) | INTRAMUSCULAR | Status: DC
  Administered 2015-05-21: 40 mg via INTRAVENOUS
  Filled 2015-05-20: qty 4

## 2015-05-20 MED ORDER — FENTANYL CITRATE (PF) 100 MCG/2ML IJ SOLN
50.0000 ug | Freq: Once | INTRAMUSCULAR | Status: AC
  Administered 2015-05-20: 50 ug via INTRAVENOUS
  Filled 2015-05-20: qty 2

## 2015-05-20 MED ORDER — VITAMIN D (ERGOCALCIFEROL) 1.25 MG (50000 UNIT) PO CAPS
50000.0000 [IU] | ORAL_CAPSULE | ORAL | Status: DC
  Administered 2015-05-22: 50000 [IU] via ORAL
  Filled 2015-05-20: qty 1

## 2015-05-20 MED ORDER — ASPIRIN EC 81 MG PO TBEC
81.0000 mg | DELAYED_RELEASE_TABLET | Freq: Every day | ORAL | Status: DC
  Administered 2015-05-21 – 2015-05-24 (×4): 81 mg via ORAL
  Filled 2015-05-20 (×4): qty 1

## 2015-05-20 MED ORDER — SODIUM CHLORIDE 0.9 % IV SOLN: 510.0000 mg | Freq: Once | INTRAVENOUS | Status: DC

## 2015-05-20 MED ORDER — FUROSEMIDE 10 MG/ML IJ SOLN
40.0000 mg | Freq: Once | INTRAMUSCULAR | Status: AC
  Administered 2015-05-20: 40 mg via INTRAVENOUS
  Filled 2015-05-20: qty 4

## 2015-05-20 MED ORDER — ADULT MULTIVITAMIN W/MINERALS CH
ORAL_TABLET | Freq: Every day | ORAL | Status: DC
  Administered 2015-05-20 – 2015-05-24 (×5): 1 via ORAL
  Filled 2015-05-20 (×5): qty 1

## 2015-05-20 MED ORDER — FERROUS SULFATE 325 (65 FE) MG PO TABS
325.0000 mg | ORAL_TABLET | Freq: Every day | ORAL | Status: DC
  Administered 2015-05-20 – 2015-05-24 (×5): 325 mg via ORAL
  Filled 2015-05-20 (×5): qty 1

## 2015-05-20 MED ORDER — SODIUM CHLORIDE 0.9 % IV BOLUS (SEPSIS)
1000.0000 mL | Freq: Once | INTRAVENOUS | Status: AC
  Administered 2015-05-20: 1000 mL via INTRAVENOUS

## 2015-05-20 MED ORDER — POTASSIUM CHLORIDE 10 MEQ/100ML IV SOLN
10.0000 meq | INTRAVENOUS | Status: AC
  Administered 2015-05-20 (×3): 10 meq via INTRAVENOUS
  Filled 2015-05-20 (×3): qty 100

## 2015-05-20 MED ORDER — RALOXIFENE HCL 60 MG PO TABS
60.0000 mg | ORAL_TABLET | Freq: Every day | ORAL | Status: DC
  Administered 2015-05-20 – 2015-05-24 (×5): 60 mg via ORAL
  Filled 2015-05-20 (×5): qty 1

## 2015-05-20 MED ORDER — PANTOPRAZOLE SODIUM 40 MG PO TBEC
40.0000 mg | DELAYED_RELEASE_TABLET | Freq: Every day | ORAL | Status: DC
  Administered 2015-05-20 – 2015-05-24 (×5): 40 mg via ORAL
  Filled 2015-05-20 (×5): qty 1

## 2015-05-20 MED ORDER — ASPIRIN 81 MG PO CHEW
324.0000 mg | CHEWABLE_TABLET | ORAL | Status: AC
  Administered 2015-05-20: 324 mg via ORAL
  Filled 2015-05-20: qty 4

## 2015-05-20 MED ORDER — HYDRALAZINE HCL 50 MG PO TABS
100.0000 mg | ORAL_TABLET | Freq: Three times a day (TID) | ORAL | Status: DC
  Administered 2015-05-20 – 2015-05-21 (×5): 100 mg via ORAL
  Filled 2015-05-20 (×6): qty 2

## 2015-05-20 MED ORDER — MONTELUKAST SODIUM 10 MG PO TABS
10.0000 mg | ORAL_TABLET | Freq: Every day | ORAL | Status: DC
  Administered 2015-05-20 – 2015-05-24 (×5): 10 mg via ORAL
  Filled 2015-05-20 (×5): qty 1

## 2015-05-20 MED ORDER — ALBUTEROL SULFATE (2.5 MG/3ML) 0.083% IN NEBU: 2.5000 mg | INHALATION_SOLUTION | Freq: Four times a day (QID) | RESPIRATORY_TRACT | Status: DC | PRN

## 2015-05-20 MED ORDER — HEPARIN SODIUM (PORCINE) 5000 UNIT/ML IJ SOLN
5000.0000 [IU] | Freq: Three times a day (TID) | INTRAMUSCULAR | Status: DC
  Administered 2015-05-20 – 2015-05-24 (×13): 5000 [IU] via SUBCUTANEOUS
  Filled 2015-05-20 (×13): qty 1

## 2015-05-20 MED ORDER — ASPIRIN 300 MG RE SUPP: 300.0000 mg | RECTAL | Status: AC

## 2015-05-20 MED ORDER — FLUTICASONE PROPIONATE 50 MCG/ACT NA SUSP
2.0000 | Freq: Every day | NASAL | Status: DC
  Administered 2015-05-20 – 2015-05-24 (×5): 2 via NASAL
  Filled 2015-05-20: qty 16

## 2015-05-20 MED ORDER — METOCLOPRAMIDE HCL 5 MG/ML IJ SOLN
10.0000 mg | Freq: Once | INTRAMUSCULAR | Status: AC
  Administered 2015-05-20: 10 mg via INTRAVENOUS

## 2015-05-20 MED ORDER — ACETAMINOPHEN 325 MG PO TABS
650.0000 mg | ORAL_TABLET | ORAL | Status: DC | PRN
  Administered 2015-05-21: 650 mg via ORAL
  Filled 2015-05-20: qty 2

## 2015-05-20 MED ORDER — MOMETASONE FURO-FORMOTEROL FUM 200-5 MCG/ACT IN AERO
2.0000 | INHALATION_SPRAY | Freq: Two times a day (BID) | RESPIRATORY_TRACT | Status: DC
  Administered 2015-05-20 – 2015-05-24 (×7): 2 via RESPIRATORY_TRACT
  Filled 2015-05-20 (×2): qty 8.8

## 2015-05-20 MED ORDER — GUAIFENESIN-DM 100-10 MG/5ML PO SYRP: 10.0000 mL | ORAL_SOLUTION | ORAL | Status: DC | PRN

## 2015-05-20 MED ORDER — DOXAZOSIN MESYLATE 2 MG PO TABS
2.0000 mg | ORAL_TABLET | Freq: Every day | ORAL | Status: DC
  Administered 2015-05-20 – 2015-05-23 (×4): 2 mg via ORAL
  Filled 2015-05-20 (×4): qty 1

## 2015-05-20 MED ORDER — CYCLOBENZAPRINE HCL 10 MG PO TABS
10.0000 mg | ORAL_TABLET | Freq: Every day | ORAL | Status: DC
  Administered 2015-05-20 – 2015-05-23 (×4): 10 mg via ORAL
  Filled 2015-05-20 (×4): qty 1

## 2015-05-20 MED ORDER — GABAPENTIN 300 MG PO CAPS
300.0000 mg | ORAL_CAPSULE | Freq: Two times a day (BID) | ORAL | Status: DC
  Administered 2015-05-20 – 2015-05-24 (×9): 300 mg via ORAL
  Filled 2015-05-20 (×3): qty 1
  Filled 2015-05-20: qty 3
  Filled 2015-05-20 (×5): qty 1

## 2015-05-20 MED ORDER — SUCRALFATE 1 GM/10ML PO SUSP: 1.0000 g | Freq: Two times a day (BID) | ORAL | Status: DC

## 2015-05-20 MED ORDER — ONDANSETRON HCL 4 MG/2ML IJ SOLN: 4.0000 mg | Freq: Four times a day (QID) | INTRAMUSCULAR | Status: DC | PRN

## 2015-05-20 MED ORDER — METOLAZONE 2.5 MG PO TABS: 2.5000 mg | ORAL_TABLET | ORAL | Status: DC

## 2015-05-20 MED ORDER — ATORVASTATIN CALCIUM 40 MG PO TABS
40.0000 mg | ORAL_TABLET | Freq: Every day | ORAL | Status: DC
  Administered 2015-05-20 – 2015-05-23 (×4): 40 mg via ORAL
  Filled 2015-05-20 (×4): qty 1

## 2015-05-20 MED ORDER — SUCRALFATE 1 GM/10ML PO SUSP
1.0000 g | Freq: Two times a day (BID) | ORAL | Status: DC
  Administered 2015-05-20 – 2015-05-24 (×9): 1 g via ORAL
  Filled 2015-05-20 (×9): qty 10

## 2015-05-20 MED ORDER — ONDANSETRON HCL 4 MG/2ML IJ SOLN
4.0000 mg | Freq: Once | INTRAMUSCULAR | Status: AC
  Administered 2015-05-20: 4 mg via INTRAVENOUS
  Filled 2015-05-20: qty 2

## 2015-05-20 MED ORDER — ISOSORBIDE MONONITRATE ER 60 MG PO TB24
120.0000 mg | ORAL_TABLET | Freq: Every day | ORAL | Status: DC
  Administered 2015-05-20 – 2015-05-22 (×3): 120 mg via ORAL
  Filled 2015-05-20 (×4): qty 2

## 2015-05-20 MED ORDER — METOCLOPRAMIDE HCL 10 MG PO TABS
10.0000 mg | ORAL_TABLET | Freq: Three times a day (TID) | ORAL | Status: DC
  Administered 2015-05-20 – 2015-05-24 (×16): 10 mg via ORAL
  Filled 2015-05-20 (×16): qty 1

## 2015-05-20 MED ORDER — ALBUTEROL SULFATE HFA 108 (90 BASE) MCG/ACT IN AERS: 2.0000 | INHALATION_SPRAY | Freq: Four times a day (QID) | RESPIRATORY_TRACT | Status: DC | PRN

## 2015-05-20 MED ORDER — FERROUS SULFATE 325 (65 FE) MG PO TABS: 325.0000 mg | ORAL_TABLET | Freq: Every day | ORAL | Status: DC

## 2015-05-20 MED ORDER — DONEPEZIL HCL 10 MG PO TABS
10.0000 mg | ORAL_TABLET | Freq: Every day | ORAL | Status: DC
  Administered 2015-05-20 – 2015-05-23 (×4): 10 mg via ORAL
  Filled 2015-05-20 (×4): qty 1

## 2015-05-20 MED ORDER — HYDRALAZINE HCL 20 MG/ML IJ SOLN
10.0000 mg | Freq: Once | INTRAMUSCULAR | Status: AC
  Administered 2015-05-20: 10 mg via INTRAVENOUS
  Filled 2015-05-20: qty 1

## 2015-05-20 MED ORDER — INSULIN GLARGINE 100 UNIT/ML ~~LOC~~ SOLN
25.0000 [IU] | Freq: Every day | SUBCUTANEOUS | Status: DC
  Administered 2015-05-21 – 2015-05-23 (×3): 25 [IU] via SUBCUTANEOUS
  Filled 2015-05-20 (×6): qty 0.25

## 2015-05-20 MED ORDER — FUROSEMIDE 10 MG/ML IJ SOLN: 60.0000 mg | Freq: Once | INTRAMUSCULAR | Status: DC

## 2015-05-20 MED ORDER — CETYLPYRIDINIUM CHLORIDE 0.05 % MT LIQD
7.0000 mL | Freq: Two times a day (BID) | OROMUCOSAL | Status: DC
  Administered 2015-05-20 – 2015-05-24 (×8): 7 mL via OROMUCOSAL

## 2015-05-20 MED ORDER — ASPIRIN EC 81 MG PO TBEC: 81.0000 mg | DELAYED_RELEASE_TABLET | Freq: Every day | ORAL | Status: DC

## 2015-05-20 NOTE — Progress Notes (Signed)
RT NOTE:  Pt setup on CPAP. Home settings: 6cm H2O, 2L O2 bled in via nasal mask. Humididty chamber filled. Pt tolerating well./

## 2015-05-20 NOTE — Plan of Care (Signed)
Problem: Education: Goal: Knowledge of disease or condition will improve Outcome: Progressing Discussed with husband and family plan of care for the patient regarding medications.

## 2015-05-20 NOTE — Plan of Care (Signed)
Problem: Education: Goal: Knowledge of the prescribed therapeutic regimen will improve Outcome: Progressing Discussed medications and some possible side effects.

## 2015-05-20 NOTE — ED Notes (Signed)
MD at bedside. 

## 2015-05-20 NOTE — H&P (Signed)
History and Physical    Joann Gutierrez [id number] DOB: [birth year] DOA: 05/19/2015  Referring Provider: Dr. Kathrynn Humble (EDP)  PCP: Kandice Hams, MD  Outpatient Specialists:  Dr. Halford Chessman (pulmonology), Dr. Deatra Ina (GI)   Patient coming from: Home   Chief Complaint: Headache, generalized weakness, malaise   HPI: Joann Gutierrez is a 70 y.o. female with medical history significant for insulin-dependent diabetes mellitus, hypertension, chronic kidney disease stage III, CAD, and chronic diastolic CHF who presents to the ED with 1 day of headaches, generalized weakness, and malaise. Patient reports that she had been in her usual state until the insidious development of headaches, weakness, and malaise today. She denies chest pains, palpitations, or dyspnea. Patient denies any fevers, chills, sick contacts, or long distance travel. She reports some nausea and has vomited once today. She reports that the emesis was nonbloody. She denies abdominal pain or diarrhea. She has had similar symptoms previously, but not as severe per her report. She had not tried any interventions for her symptoms prior to coming to the ED.  ED Course: Upon arrival to the ED, patient is found to be afebrile, saturating adequately on room air, hypertensive to 200/77, but with vitals otherwise stable. EKG features a sinus rhythm with anterior Q waves. Chest x-ray demonstrates mild vascular congestion and mild cardiomegaly with increased interstitial markings consistent with mild interstitial edema. Head CT is negative for acute intracranial abnormality. Troponin is mildly elevated to 0.05 and BNP is 119. Urine is obtained for analysis and grossly negative for infection but features 100 protein. BMP is notable for serum creatinine 1.67, up from an apparent baseline of 1.26. Hypokalemia to 3.3 is also noted. Patient was treated in the emergency department with 10 mg IV hydralazine, Lasix 40 mg IV, fentanyl, Reglan, and Zofran.  Blood pressure improved to the 180/80 range while still in the ED and the patient reported improvement in her presenting complaints. She has remained hemodynamically stable and will be admitted to the hospital for ongoing evaluation and management of suspected hypertensive emergency.  Review of Systems:  All other systems reviewed and apart from HPI, are negative.  Past Medical History  Diagnosis Date  . Allergy   . Depression   . Diabetes mellitus 1997    Type II   . GERD (gastroesophageal reflux disease)   . Hypertension   . Hyperlipidemia   . Osteoarthritis   . Osteoporosis   . Hair loss   . Anemia   . CAD (coronary artery disease)     Mild very minimal coronary disease with 20% obtuse marginal stenosis  . CVA (cerebral infarction)   . Diverticulosis   . Esophageal stricture   . Gastritis   . Adenomatous colon polyp   . Sickle cell trait ([place])   . Asthma        . COPD (chronic obstructive pulmonary disease) ([place])   . Secondary pulmonary hypertension ([place]) 03/07/2009  . Gastroparesis 08/21/2007  . RENAL INSUFFICIENCY 02/16/2009  . OSTEOARTHRITIS 08/09/2006  . Morbid obesity ([place])   . CHF (congestive heart failure) ([place])   . Carpal tunnel syndrome on left   . 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 Hernia, hiatal   . Seizures ([place])     pt thinks it has been several monthes since she had a seisure  . Stroke ([place])   . Hearing loss of both ears   . 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 Chronic kidney disease (CKD), stage III (moderate)  Past Surgical History  Procedure Laterality Date  . Abdominal hysterectomy    . Replacement total knee Left 1998  . Esophagogastroduodenoscopy  06/25/2006  . Erd  08/08/2000  . Artery biopsy  01/07/2011    Procedure: MINOR BIOPSY TEMPORAL ARTERY;  Surgeon: Haywood Lasso, MD;  Location: [facility];  Service: General;  Laterality: Left;  left temporal artery biopsy  . Breast lumpectomy      benign  .  Breast lumpectomy      both breast lumps removed   . Hernia repair    . Colonoscopy    . Upper gastrointestinal endoscopy    . Bil foot surgery    . Left and right heart catheterization with coronary angiogram N/A 03/03/2013    Procedure: LEFT AND RIGHT HEART CATHETERIZATION WITH CORONARY ANGIOGRAM;  Surgeon: Minus Breeding, MD;  Location: [facility] CATH LAB;  Service: Cardiovascular;  Laterality: N/A;  . Esophageal manometry N/A 03/13/2015    Procedure: ESOPHAGEAL MANOMETRY (EM);  Surgeon: Manus Gunning, MD;  Location: [facility] ENDOSCOPY;  Service: Gastroenterology;  Laterality: N/A;     reports that she quit smoking about 35 years ago. She has never used smokeless tobacco. She reports that she does not drink alcohol or use illicit drugs.  Allergies  Allergen Reactions  . Promethazine Hcl Other (See Comments)    REACTION: lethargy  . Morphine And Related     Family request not to be given, reports pt does not wake up when given     Family History  Problem Relation Age of Onset  . Colon cancer Brother   . Cancer Brother     Colon Cancer  . Liver cancer Mother     Liver Cancer  . Diabetes Mother   . Kidney disease Mother   . Heart disease Mother     age 87's  . Heart disease Father 69    MI  . Heart attack Father     died of MI when pt was 9  . Sickle cell anemia Father   . Diabetes Sister   . Kidney disease Sister   . Heart disease Sister     age 11's  . Allergies Sister   . Diabetes Sister   . Kidney disease Sister   . Heart disease Sister     age 8's  . Esophageal cancer Neg Hx   . Rectal cancer Neg Hx   . Stomach cancer Neg Hx      Prior to Admission medications   Medication Sig Start Date End Date Taking? Authorizing Provider  albuterol (VENTOLIN HFA) 108 (90 BASE) MCG/ACT inhaler Inhale 2 puffs into the lungs every 6 (six) hours as needed for shortness of breath. 11/10/14  Yes [name], MD  aspirin 325 MG tablet Take 325 mg by mouth daily.    Yes Historical  Provider, MD  aspirin 81 MG tablet Take 81 mg by mouth daily.   Yes Historical Provider, MD  atorvastatin (LIPITOR) 40 MG tablet Take 1 tablet (40 mg total) by mouth daily. Patient taking differently: Take 40 mg by mouth at bedtime.  09/07/10  Yes Minus Breeding, MD  Calcium Carbonate-Vit D-Min (CALTRATE PLUS PO) Take 600 mg by mouth every morning.    Yes Historical Provider, MD  [facility] Liver Oil CAPS Take 1 capsule by mouth daily.   Yes Historical Provider, MD  cyclobenzaprine (FLEXERIL) 10 MG tablet Take 10 mg by mouth at bedtime. Reported on 02/07/2015   Yes Historical Provider, MD  dextromethorphan-guaiFENesin (ROBITUSSIN-DM) 10-100 MG/5ML liquid Take 10 mLs by mouth every 4 (four) hours as needed for cough. Reported on 01/25/2015   Yes Historical Provider, MD  donepezil (ARICEPT) 10 MG tablet Take 10 mg by mouth at bedtime.    Yes Historical Provider, MD  doxazosin (CARDURA) 2 MG tablet Take 1 tablet by mouth at bedtime. 10/10/14  Yes Historical Provider, MD  ezetimibe (ZETIA) 10 MG tablet Take 10 mg by mouth at bedtime.    Yes Historical Provider, MD  ferrous sulfate 325 (65 FE) MG EC tablet Take 325 mg by mouth every morning. Reported on 01/25/2015   Yes Historical Provider, MD  fluticasone (FLONASE) 50 MCG/ACT nasal spray Place 2 sprays into both nostrils daily. 11/10/14 08/27/17 Yes [name], MD  furosemide (LASIX) 80 MG tablet Take 1 tablet by mouth 2 (two) times daily. 07/12/14  Yes Historical Provider, MD  gabapentin (NEURONTIN) 300 MG capsule Take 1 capsule (300 mg total) by mouth 2 (two) times daily. 04/29/11  Yes Renato Shin, MD  hydrALAZINE (APRESOLINE) 100 MG tablet Take 100 mg by mouth 3 (three) times daily.    Yes Historical Provider, MD  insulin glargine (LANTUS) 100 UNIT/ML injection Inject 0.15 mLs (15 Units total) into the skin daily. Patient taking differently: Inject 30 Units into the skin daily.  09/18/13  Yes Maryann Mikhail, DO  insulin NPH (HUMULIN N) 100 UNIT/ML injection Inject  2-13 Units into the skin 3 (three) times daily after meals. Reported on 01/25/2015 07/07/12  Yes Renato Shin, MD  isosorbide mononitrate (IMDUR) 120 MG 24 hr tablet Take 1 tablet (120 mg total) by mouth daily. PATIENT NEEDS TO CONTACT OFFICE FOR ADDITIONAL REFILLS 01/11/15  Yes Minus Breeding, MD  KLOR-CON M20 20 MEQ tablet Take 1 tablet by mouth daily.  06/19/11  Yes Historical Provider, MD  LIDODERM 5 % Place 1 patch onto the skin daily as needed (pain). Reported on 02/07/2015 10/20/11  Yes Historical Provider, MD  losartan (COZAAR) 100 MG tablet Take 1 tablet by mouth every morning.  09/12/14  Yes Historical Provider, MD  metoCLOPramide (REGLAN) 10 MG tablet TAKE ONE TABLET BY MOUTH BEFORE MEALS AND AT BEDTIME 04/04/15  Yes Tanda Rockers, MD  metolazone (ZAROXOLYN) 2.5 MG tablet Take 2.5 mg by mouth 3 (three) times a week. Take 1 tablet by mouth on Monday, Wednesday and Friday 10/10/14  Yes Historical Provider, MD  montelukast (SINGULAIR) 10 MG tablet Take 1 tablet (10 mg total) by mouth daily. 11/10/14  Yes [name], MD  Multiple Vitamins-Minerals (MULTIVITAMIN GUMMIES ADULT) CHEW Chew 1 capsule by mouth daily.   Yes Historical Provider, MD  nitroGLYCERIN (NITROSTAT) 0.4 MG SL tablet PLACE ONE TABLET UNDER THE TONGUE EVERY FIVE MINUTES AS NEEDED FOR CHEST PAIN 05/02/15  Yes Minus Breeding, MD  Omega-3 Fatty Acids (FISH OIL) 1000 MG CAPS Take 1 capsule by mouth daily.   Yes Historical Provider, MD  omeprazole (PRILOSEC) 40 MG capsule Take one po daily 11/07/14  Yes Jessica D Zehr, PA-C  OXYGEN Inhale into the lungs. CPAP with oxygen at bedtime   Yes Historical Provider, MD  raloxifene (EVISTA) 60 MG tablet Take 60 mg by mouth every morning.    Yes Historical Provider, MD  sucralfate (CARAFATE) 1 GM/10ML suspension Take 10 mLs (1 g total) by mouth 2 (two) times daily. 07/07/12  Yes Inda Castle, MD  SYMBICORT 160-4.5 MCG/ACT inhaler INHALE 2 PUFFS INTO LUNGS 2 TIMES DAILY 04/04/15  Yes Tammy Jeralene Huff,  NP  traMADol (ULTRAM) 50 MG tablet Take 50 mg by mouth every 6 (six) hours as needed for moderate pain. Reported on 02/07/2015   Yes Historical Provider, MD  Vitamin D, Ergocalciferol, (DRISDOL) 50000 UNITS CAPS capsule Take 50,000 Units by mouth every Monday.    Yes Historical Provider, MD    Physical Exam: Filed Vitals:   05/20/15 0431 05/20/15 0500 05/20/15 0531 05/20/15 0600  BP: 176/77  163/74 153/71  Pulse: 116 109  100  Temp: 98.9 F (37.2 C)     TempSrc: Oral     Resp: 28 23  16   Height: 5\' 4"  (1.626 m)     Weight: 131 kg (288 lb 12.8 oz)     SpO2: 99% 99%  99%      Constitutional: NAD, calm, somnolent Eyes: PERTLA, lids and conjunctivae normal ENMT: Mucous membranes are moist. Posterior pharynx clear of any exudate or lesions.   Neck: normal, supple, no masses, no thyromegaly Respiratory: clear to auscultation bilaterally, no wheezing, no crackles. Normal respiratory effort.   Cardiovascular: S1 & S2 heard, regular rate and rhythm, no murmurs / rubs / gallops. Bilateral LE edema 2+.   Abdomen: No distension, no tenderness, no masses palpated. Bowel sounds normal.  Musculoskeletal: no clubbing / cyanosis. No joint deformity upper and lower extremities.   Skin: no rashes, lesions, ulcers. No induration Neurologic: CN 2-12 grossly intact. Sensation intact, DTR normal. Strength 5/5 in all 4 limbs.  Psychiatric: Normal judgment and insight. Alert and oriented x 3. Normal mood.     Labs on Admission: I have personally reviewed following labs and imaging studies  CBC:  Recent Labs Lab 05/19/15 1033 05/19/15 2358  WBC  --  7.8  HGB 11.2* 11.4*  HCT  --  36.4  MCV  --  72.1*  PLT  --  [id number]   Basic Metabolic Panel:  Recent Labs Lab 05/19/15 2358  NA 142  K 3.3*  CL 103  CO2 27  GLUCOSE 227*  BUN 55*  CREATININE 1.67*  CALCIUM 9.4   GFR: Estimated Creatinine Clearance: 42.2 mL/min (by C-G formula based on Cr of 1.67). Liver Function Tests: No results for  input(s): AST, ALT, ALKPHOS, BILITOT, PROT, ALBUMIN in the last 168 hours. No results for input(s): LIPASE, AMYLASE in the last 168 hours. No results for input(s): AMMONIA in the last 168 hours. Coagulation Profile: No results for input(s): INR, PROTIME in the last 168 hours. Cardiac Enzymes:  Recent Labs Lab 05/19/15 0048 05/20/15 0434  TROPONINI 0.05* 0.08*   BNP (last 3 results)  Recent Labs  05/20/14 1620  PROBNP 65.0   HbA1C: No results for input(s): HGBA1C in the last 72 hours. CBG:  Recent Labs Lab 05/20/15 0017  GLUCAP 212*   Lipid Profile: No results for input(s): CHOL, HDL, LDLCALC, TRIG, CHOLHDL, LDLDIRECT in the last 72 hours. Thyroid Function Tests:  Recent Labs  05/20/15 0434  TSH 1.555  FREET4 0.98   Anemia Panel: No results for input(s): VITAMINB12, FOLATE, FERRITIN, TIBC, IRON, RETICCTPCT in the last 72 hours. Urine analysis:    Component Value Date/Time   COLORURINE YELLOW 05/20/2015 0148   APPEARANCEUR CLEAR 05/20/2015 0148   LABSPEC 1.016 05/20/2015 0148   PHURINE 5.0 05/20/2015 0148   GLUCOSEU 100* 05/20/2015 0148   GLUCOSEU >=1000 01/02/2009 1101   HGBUR NEGATIVE 05/20/2015 0148   HGBUR negative 03/25/2008 1022   BILIRUBINUR NEGATIVE 05/20/2015 0148   KETONESUR NEGATIVE 05/20/2015 0148   PROTEINUR 100* 05/20/2015 0148   UROBILINOGEN  0.2 09/14/2013 1632   NITRITE NEGATIVE 05/20/2015 0148   LEUKOCYTESUR NEGATIVE 05/20/2015 0148   Sepsis Labs: @LABRCNTIP (procalcitonin:4,lacticidven:4) ) Recent Results (from the past 240 hour(s))  MRSA PCR Screening     Status: None   Collection Time: 05/20/15  4:12 AM  Result Value Ref Range Status   MRSA by PCR NEGATIVE NEGATIVE Final    Comment:        The GeneXpert MRSA Assay (FDA approved for NASAL specimens only), is one component of a comprehensive MRSA colonization surveillance program. It is not intended to diagnose MRSA infection nor to guide or monitor treatment for MRSA  infections.      Radiological Exams on Admission: Dg Chest 2 View  05/20/2015  CLINICAL DATA:  Acute onset of generalized weakness, shortness of breath and headache. Cough and dyspnea. Vomiting. Initial encounter. EXAM: CHEST  2 VIEW COMPARISON:  Chest radiograph performed 09/22/2014 FINDINGS: The lungs are well-aerated. Mild vascular congestion is noted. Mildly increased interstitial markings may reflect minimal interstitial edema. There is no evidence of pleural effusion or pneumothorax. The heart is mildly enlarged. No acute osseous abnormalities are seen. IMPRESSION: Mild vascular congestion and mild cardiomegaly. Mildly increased interstitial markings may reflect minimal interstitial edema. Electronically Signed   By: Garald Balding M.D.   On: 05/20/2015 01:44   Ct Head Wo Contrast  05/20/2015  CLINICAL DATA:  Severe posterior headache. History of seizures and stroke, diabetes, hypertension, hyperlipidemia. EXAM: CT HEAD WITHOUT CONTRAST TECHNIQUE: Contiguous axial images were obtained from the base of the skull through the vertex without intravenous contrast. COMPARISON:  CT head November 02, 2014 FINDINGS: INTRACRANIAL CONTENTS: The ventricles and sulci are normal for age. No intraparenchymal hemorrhage, mass effect nor midline shift. Patchy to come supratentorial white matter hypodensities are relatively unchanged. Old Small LEFT cerebellar infarct. No acute large vascular territory infarcts. No abnormal extra-axial fluid collections. Basal cisterns are patent. Mild calcific atherosclerosis of the carotid siphons. ORBITS: The included ocular globes and orbital contents are non-suspicious. SINUSES: The mastoid aircells and included paranasal sinuses are well-aerated. SKULL/SOFT TISSUES: No skull fracture. No significant soft tissue swelling. Patient is edentulous. IMPRESSION: No acute intracranial process. 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  Electronically Signed   By: Elon Alas M.D.   On: 05/20/2015 01:51    EKG: Independently reviewed. Sinus rhythm, anterior Q-wave.   Assessment/Plan  1. Hypertensive emergency  - BP 243/130 with EMS, 200/80 in ED initially  - Presents with associated HA, elevated trop, proteinuria with AKI - Treated with HLZ 10 mg IV and Lasix 40 mg IV in ED  - BP and sxs improved in ED but BP trending back up  - 1" nitro paste placed  - Monitor on telemetry, goal SBP 170 for short-term  - Resume home antihypertensives    2. CAD with elevated troponin  - Initial troponin 0.05 in absence of angina or acute EKG changes  - Likely represents demand ischemia in setting of severe HTN  - Obtain serial troponin measurements  - Monitor on telemetry for ischemic changes  - ASA 324 given  - Continue statin, daily ASA, Imdur    3. Hypokalemia  - Serum potassium 3.3 on admission  - Supplementing with 30 mEq IV  - Check magnesium level and replete prn    4. AKI on CKD stage III  - SCr 1.67 on admission, up from apparent baseline of 1.26  - Suspected secondary to acute severe HTN  with proteinuria  - Managing BP as above  - Avoiding nephrotoxins where possible; hold losartan until renal fxn stabilizes - Repeat chem panel tomorrow    5. Hypertension  - Managed at home with HLZ, losartan - Holding losartan given the AKI  - Managing acutely as above in #1    6. Gastroparesis  - Stable  - Continue Reglan with meals    7. Type II DM  - Managed with Lantus 30 units qD and Humilin per sliding scale at home  - Continue with Lantus 25 units daily and moderate-intensity sliding scale correctional  - Check CBG with meals and qHS, adjust insulins prn  - Carb-modified diet when appropriate  - A1c was 6.9% in April 2014, reflecting good control at that time, will update    DVT prophylaxis: sq heparin Code Status: Full   Family Communication: Significant other at bedside  Disposition Plan: Admit to  stepdown   Consults called: None  Admission status: Inpatient    Vianne Bulls MD [facility] Pager [phone]  If 7PM-7AM, please contact night-coverage [email or web address] Password [email or web address]  05/20/2015, 7:30 AM

## 2015-05-20 NOTE — ED Notes (Signed)
Patient transported to X-ray 

## 2015-05-20 NOTE — Progress Notes (Signed)
70 year old with h/o DM, hypertension, and CKD stage III admitted for generalized weakness and headache, was found to bei n hypertensive urgency. Her bp parameters have improved earlier this am. But her bp started dropping and we have taken her off the nitro paste and gave her 1 liter bolus. Plan to optimize the BP around 140/90 mmhg.  Continue to monitor.  Hosie Poisson MD [phone]

## 2015-05-21 DIAGNOSIS — I161 Hypertensive emergency: Principal | ICD-10-CM

## 2015-05-21 DIAGNOSIS — N179 Acute kidney failure, unspecified: Secondary | ICD-10-CM

## 2015-05-21 LAB — GLUCOSE, CAPILLARY
GLUCOSE-CAPILLARY: 170 mg/dL — AB (ref 65–99)
Glucose-Capillary: 167 mg/dL — ABNORMAL HIGH (ref 65–99)
Glucose-Capillary: 132 mg/dL — ABNORMAL HIGH (ref 65–99)
Glucose-Capillary: 260 mg/dL — ABNORMAL HIGH (ref 65–99)

## 2015-05-21 MED ORDER — SODIUM CHLORIDE 0.9 % IV SOLN
INTRAVENOUS | Status: DC
  Administered 2015-05-21 – 2015-05-23 (×4): via INTRAVENOUS

## 2015-05-21 NOTE — Progress Notes (Signed)
PROGRESS NOTE    Joann Gutierrez  [id number] DOB: 07-06-1945 DOA: 05/19/2015 PCP: Kandice Hams, MD     Brief Narrative: Joann Gutierrez is a 70 y.o. female with medical history significant for insulin-dependent diabetes mellitus, hypertension, chronic kidney disease stage III, CAD, and chronic diastolic CHF who presents to the ED with 1 day of headaches, generalized weakness, and malaise.    Assessment & Plan:   Principal Problem:   Hypertensive emergency Active Problems:   HYPERCHOLESTEROLEMIA   Gastroparesis   Microcytic anemia   CKD (chronic kidney disease), stage III   Hypertension   Insulin dependent diabetes mellitus (HCC)   Hypokalemia   AKI (acute kidney injury) ([place])   Accelerated Hypertension: Improved. Off the nitro paste.  Resume hydralazine, imdur. Lasix and metolazone held for acute renal failure.    Acute on CKD STAGE 3.: Possibly pre renal from hypoperfusion from low blood pressures on admission .  IV hydrate and repeat renal parameters in am.    Insulin Dependent DM: CBG (last 3)   Recent Labs  05/21/15 0818 05/21/15 1227 05/21/15 1652  GLUCAP 167* 132* 170*    Resume lantus and ssi.       DVT prophylaxis: /Heparin/ Code Status: full code.  Family Communication: none at bedside, discussed with patient.  Disposition Plan: pending PT eval, possibly home when renal parameters improve.    Consultants:   none   Procedures: none   Antimicrobials:none   Subjective: Reports feeling better, some hip pain.   Objective: Filed Vitals:   05/21/15 0500 05/21/15 0600 05/21/15 0819 05/21/15 0910  BP: 110/55 127/76 124/61   Pulse: 76 86 81   Temp:   98.6 F (37 C)   TempSrc:   Axillary   Resp: 18 18 17    Height:      Weight: 130.2 kg (287 lb 0.6 oz)     SpO2: 98% 96% 99% 98%    Intake/Output Summary (Last 24 hours) at 05/21/15 0912 Last data filed at 05/21/15 0600  Gross per 24 hour  Intake    660 ml  Output       0 ml  Net    660 ml   Filed Weights   05/19/15 2311 05/20/15 0431 05/21/15 0500  Weight: 129.275 kg (285 lb) 131 kg (288 lb 12.8 oz) 130.2 kg (287 lb 0.6 oz)    Examination:  General exam: Appears calm and comfortable  Respiratory system: Clear to auscultation. Respiratory effort normal.on [place] oxygen.  Cardiovascular system: S1 & S2 heard, RRR. No JVD, murmurs, rubs, gallops or clicks. No pedal edema. Gastrointestinal system: Abdomen is nondistended, soft and nontender. No organomegaly or masses felt. Normal bowel sounds heard. Central nervous system: Alert and oriented. No focal neurological deficits. Extremities: Symmetric 5 x 5 power. Skin: No rashes, lesions or ulcers     Data Reviewed: I have personally reviewed following labs and imaging studies  CBC:  Recent Labs Lab 05/19/15 1033 05/19/15 2358 05/20/15 1651  WBC  --  7.8 10.7*  HGB 11.2* 11.4* 10.1*  HCT  --  36.4 32.9*  MCV  --  72.1* 72.3*  PLT  --  240 [id number]   Basic Metabolic Panel:  Recent Labs Lab 05/19/15 2358 05/20/15 0859 05/20/15 1651  NA 142  --  142  K 3.3*  --  3.5  CL 103  --  106  CO2 27  --  27  GLUCOSE 227*  --  95  BUN 55*  --  55*  CREATININE 1.67*  --  2.98*  CALCIUM 9.4  --  8.7*  MG  --  2.2  --    GFR: Estimated Creatinine Clearance: 23.5 mL/min (by C-G formula based on Cr of 2.98). Liver Function Tests: No results for input(s): AST, ALT, ALKPHOS, BILITOT, PROT, ALBUMIN in the last 168 hours. No results for input(s): LIPASE, AMYLASE in the last 168 hours. No results for input(s): AMMONIA in the last 168 hours. Coagulation Profile: No results for input(s): INR, PROTIME in the last 168 hours. Cardiac Enzymes:  Recent Labs Lab 05/19/15 0048 05/20/15 0434 05/20/15 0859 05/20/15 1651  TROPONINI 0.05* 0.08* 0.05* 0.04*   BNP (last 3 results) No results for input(s): PROBNP in the last 8760 hours. HbA1C: No results for input(s): HGBA1C in the last 72  hours. CBG:  Recent Labs Lab 05/20/15 0800 05/20/15 1239 05/20/15 1645 05/20/15 2207 05/21/15 0818  GLUCAP 237* 201* 100* 142* 167*   Lipid Profile: No results for input(s): CHOL, HDL, LDLCALC, TRIG, CHOLHDL, LDLDIRECT in the last 72 hours. Thyroid Function Tests:  Recent Labs  05/20/15 0434  TSH 1.555  FREET4 0.98   Anemia Panel: No results for input(s): VITAMINB12, FOLATE, FERRITIN, TIBC, IRON, RETICCTPCT in the last 72 hours. Urine analysis:    Component Value Date/Time   COLORURINE YELLOW 05/20/2015 0148   APPEARANCEUR CLEAR 05/20/2015 0148   LABSPEC 1.016 05/20/2015 0148   PHURINE 5.0 05/20/2015 0148   GLUCOSEU 100* 05/20/2015 0148   GLUCOSEU >=1000 01/02/2009 1101   HGBUR NEGATIVE 05/20/2015 0148   HGBUR negative 03/25/2008 1022   BILIRUBINUR NEGATIVE 05/20/2015 0148   KETONESUR NEGATIVE 05/20/2015 0148   PROTEINUR 100* 05/20/2015 0148   UROBILINOGEN 0.2 09/14/2013 1632   NITRITE NEGATIVE 05/20/2015 0148   LEUKOCYTESUR NEGATIVE 05/20/2015 0148   Sepsis Labs: @LABRCNTIP (procalcitonin:4,lacticidven:4)  ) Recent Results (from the past 240 hour(s))  MRSA PCR Screening     Status: None   Collection Time: 05/20/15  4:12 AM  Result Value Ref Range Status   MRSA by PCR NEGATIVE NEGATIVE Final    Comment:        The GeneXpert MRSA Assay (FDA approved for NASAL specimens only), is one component of a comprehensive MRSA colonization surveillance program. It is not intended to diagnose MRSA infection nor to guide or monitor treatment for MRSA infections.          Radiology Studies: Dg Chest 2 View  05/20/2015  CLINICAL DATA:  Acute onset of generalized weakness, shortness of breath and headache. Cough and dyspnea. Vomiting. Initial encounter. EXAM: CHEST  2 VIEW COMPARISON:  Chest radiograph performed 09/22/2014 FINDINGS: The lungs are well-aerated. Mild vascular congestion is noted. Mildly increased interstitial markings may reflect minimal interstitial  edema. There is no evidence of pleural effusion or pneumothorax. The heart is mildly enlarged. No acute osseous abnormalities are seen. IMPRESSION: Mild vascular congestion and mild cardiomegaly. Mildly increased interstitial markings may reflect minimal interstitial edema. Electronically Signed   By: Garald Balding M.D.   On: 05/20/2015 01:44   Ct Head Wo Contrast  05/20/2015  CLINICAL DATA:  Severe posterior headache. History of seizures and stroke, diabetes, hypertension, hyperlipidemia. EXAM: CT HEAD WITHOUT CONTRAST TECHNIQUE: Contiguous axial images were obtained from the base of the skull through the vertex without intravenous contrast. COMPARISON:  CT head November 02, 2014 FINDINGS: INTRACRANIAL CONTENTS: The ventricles and sulci are normal for age. No intraparenchymal hemorrhage, mass effect nor midline shift. Patchy to come supratentorial white matter hypodensities are relatively  unchanged. Old Small LEFT cerebellar infarct. No acute large vascular territory infarcts. No abnormal extra-axial fluid collections. Basal cisterns are patent. Mild calcific atherosclerosis of the carotid siphons. ORBITS: The included ocular globes and orbital contents are non-suspicious. SINUSES: The mastoid aircells and included paranasal sinuses are well-aerated. SKULL/SOFT TISSUES: No skull fracture. No significant soft tissue swelling. Patient is edentulous. IMPRESSION: No acute intracranial process. 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 Electronically Signed   By: Elon Alas M.D.   On: 05/20/2015 01:51        Scheduled Meds: . antiseptic oral rinse  7 mL Mouth Rinse BID  . aspirin EC  81 mg Oral Daily  . atorvastatin  40 mg Oral QHS  . cyclobenzaprine  10 mg Oral QHS  . donepezil  10 mg Oral QHS  . doxazosin  2 mg Oral QHS  . ferrous sulfate  325 mg Oral Q breakfast  . [START ON 05/26/2015] ferumoxytol  510 mg Intravenous Once  . fluticasone   2 spray Each Nare Daily  . gabapentin  300 mg Oral BID  . heparin  5,000 Units Subcutaneous Q8H  . hydrALAZINE  100 mg Oral Q8H  . insulin aspart  0-15 Units Subcutaneous TID WC  . insulin glargine  25 Units Subcutaneous QHS  . isosorbide mononitrate  120 mg Oral Daily  . metoCLOPramide  10 mg Oral TID AC & HS  . [START ON 05/22/2015] metolazone  2.5 mg Oral Once per day on Mon Wed Fri  . mometasone-formoterol  2 puff Inhalation BID  . montelukast  10 mg Oral Daily  . multivitamin with minerals   Oral Daily  . pantoprazole  40 mg Oral Daily  . raloxifene  60 mg Oral Daily  . sucralfate  1 g Oral BID WC  . [START ON 05/22/2015] Vitamin D (Ergocalciferol)  50,000 Units Oral Q Mon   Continuous Infusions: . sodium chloride       LOS: 1 day    Time spent: 30 minutes.     Hosie Poisson, MD [facility] Pager [phone]  If 7PM-7AM, please contact night-coverage [email or web address] Password [email or web address] 05/21/2015, 9:12 AM

## 2015-05-21 NOTE — Progress Notes (Signed)
Patient transferring to 5W. Report called to receiving nurse Judson Roch RN). All questions answered. Patient's daughter notified of transfer.

## 2015-05-21 NOTE — Progress Notes (Signed)
NURSING PROGRESS NOTE  Joann Gutierrez [id number] Transfer Data: 05/21/2015 1:43 PM Attending Provider: Hosie Poisson, MD [id number], MD Code Status: Full   Joann Gutierrez is a 70 y.o. female patient transferred from 2 C  -No acute distress noted.  -No complaints of shortness of breath.  -No complaints of chest pain.   Cardiac Monitoring: Box # [id number] in place. Cardiac monitor yields:normal sinus rhythm.  Last Documented Vital Signs: Blood pressure 122/62, pulse 84, temperature 98.5 F (36.9 C), temperature source Oral, resp. rate 16, height 5\' 4"  (1.626 m), weight 130.2 kg (287 lb 0.6 oz), SpO2 96 %.  IV Fluids:  IV in place, occlusive dsg intact without redness, IV cath antecubital right, condition patent and no redness normal saline.   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  Past Surgical History:   has past surgical history that includes Abdominal hysterectomy; Replacement total knee (Left, 1998); Esophagogastroduodenoscopy (06/25/2006); ERD (08/08/2000); Artery Biopsy (01/07/2011); Breast lumpectomy;  Breast lumpectomy; Hernia repair; Colonoscopy; Upper gastrointestinal endoscopy; bil foot surgery; left and right heart catheterization with coronary angiogram (N/A, 03/03/2013); and Esophageal manometry (N/A, 03/13/2015).  Social History:   reports that she quit smoking about 35 years ago. She has never used smokeless tobacco. She reports that she does not drink alcohol or use illicit drugs.  Skin: intact except where otherwise charted  Patient/Family orientated to room. Information packet given to patient/family. Admission inpatient armband information verified with patient/family to include name and date of birth and placed on patient arm. Side rails up x 2, fall assessment and education completed with patient/family. Patient/family able to verbalize understanding of risk associated with falls and verbalized understanding to call for assistance before getting out of bed. Call light within reach. Patient/family able to voice and demonstrate understanding of unit orientation instructions.

## 2015-05-21 NOTE — Progress Notes (Signed)
Placed patient on CPAP for the night.  Patient is tolerating well at this time. 

## 2015-05-22 ENCOUNTER — Inpatient Hospital Stay (HOSPITAL_COMMUNITY): Payer: Medicare Other

## 2015-05-22 LAB — GLUCOSE, CAPILLARY
GLUCOSE-CAPILLARY: 141 mg/dL — AB (ref 65–99)
Glucose-Capillary: 124 mg/dL — ABNORMAL HIGH (ref 65–99)
Glucose-Capillary: 182 mg/dL — ABNORMAL HIGH (ref 65–99)

## 2015-05-22 LAB — HEMOGLOBIN A1C
HEMOGLOBIN A1C: 8.5 % — AB (ref 4.8–5.6)
Mean Plasma Glucose: 197 mg/dL

## 2015-05-22 LAB — BASIC METABOLIC PANEL
Anion gap: 10 (ref 5–15)
BUN: 69 mg/dL — AB (ref 6–20)
CHLORIDE: 106 mmol/L (ref 101–111)
CO2: 26 mmol/L (ref 22–32)
CREATININE: 2.78 mg/dL — AB (ref 0.44–1.00)
Calcium: 8.4 mg/dL — ABNORMAL LOW (ref 8.9–10.3)
GFR calc Af Amer: 19 mL/min — ABNORMAL LOW (ref >60)
GFR calc non Af Amer: 16 mL/min — ABNORMAL LOW (ref >60)
GLUCOSE: 192 mg/dL — AB (ref 65–99)
Potassium: 3.7 mmol/L (ref 3.5–5.1)
SODIUM: 142 mmol/L (ref 135–145)

## 2015-05-22 NOTE — Progress Notes (Signed)
Per Chalahan's request pt scheduled hydralazine this morning not given due to her low BP reading. will continue to monitor

## 2015-05-22 NOTE — Progress Notes (Signed)
PROGRESS NOTE    Joann Gutierrez  [id number] DOB: 05-May-1945 DOA: 05/19/2015 PCP: Kandice Hams, MD     Brief Narrative: Joann Gutierrez is a 70 y.o. female with medical history significant for insulin-dependent diabetes mellitus, hypertension, chronic kidney disease stage III, CAD, and chronic diastolic CHF who presents to the ED with 1 day of headaches, generalized weakness, and malaise.    Assessment & Plan:   Principal Problem:   Hypertensive emergency Active Problems:   HYPERCHOLESTEROLEMIA   Gastroparesis   Microcytic anemia   CKD (chronic kidney disease), stage III   Hypertension   Insulin dependent diabetes mellitus (HCC)   Hypokalemia   AKI (acute kidney injury) ([place])   Accelerated Hypertension: Improved. Off the nitro paste.  D/c hydralazine and resume imdur. Lasix and metolazone held for acute renal failure.  bp parameters soft this am, and hydralazine discontinued.  Started on IV fluids.    Acute on CKD STAGE 3.: Possibly pre renal from hypoperfusion from low blood pressures on admission .  IV hydrate and repeat renal parameters in am show some improvement.    Insulin Dependent DM: CBG (last 3)   Recent Labs  05/22/15 0806 05/22/15 1203 05/22/15 1745  GLUCAP 141* 124* 182*    Resume lantus and ssi.    Left hip pain : X rays of the hip. No fall.   Epigastric abdominal pain: Un clear etiology. Get US abdomen.    DVT prophylaxis: /Heparin/ Code Status: full code.  Family Communication: none at bedside, discussed with patient.  Disposition Plan: pending PT eval, possibly home when renal parameters improve.    Consultants:   none   Procedures: none   Antimicrobials:none   Subjective: Reports feeling better, some hip pain.   Objective: Filed Vitals:   05/21/15 2325 05/22/15 0528 05/22/15 1157 05/22/15 1522  BP:  99/57  127/62  Pulse: 86 76 75 84  Temp:  98.1 F (36.7 C)  98 F (36.7 C)  TempSrc:      Resp:  18 20 18 18   Height:      Weight:  130.9 kg (288 lb 9.3 oz)    SpO2: 97% 93%  96%    Intake/Output Summary (Last 24 hours) at 05/22/15 1822 Last data filed at 05/22/15 1523  Gross per 24 hour  Intake    840 ml  Output      0 ml  Net    840 ml   Filed Weights   05/20/15 0431 05/21/15 0500 05/22/15 0528  Weight: 131 kg (288 lb 12.8 oz) 130.2 kg (287 lb 0.6 oz) 130.9 kg (288 lb 9.3 oz)    Examination:  General exam: Appears calm and comfortable  Respiratory system: Clear to auscultation. Respiratory effort normal.on [place] oxygen.  Cardiovascular system: S1 & S2 heard, RRR. No JVD, murmurs, rubs, gallops or clicks. No pedal edema. Gastrointestinal system: Abdomen is nondistended, soft and nontender. No organomegaly or masses felt. Normal bowel sounds heard. Central nervous system: Alert and oriented. No focal neurological deficits. Extremities: Symmetric 5 x 5 power. Skin: No rashes, lesions or ulcers     Data Reviewed: I have personally reviewed following labs and imaging studies  CBC:  Recent Labs Lab 05/19/15 1033 05/19/15 2358 05/20/15 1651  WBC  --  7.8 10.7*  HGB 11.2* 11.4* 10.1*  HCT  --  36.4 32.9*  MCV  --  72.1* 72.3*  PLT  --  240 [id number]   Basic Metabolic Panel:  Recent Labs Lab 05/19/15  2358 05/20/15 0859 05/20/15 1651 05/22/15 0623  NA 142  --  142 142  K 3.3*  --  3.5 3.7  CL 103  --  106 106  CO2 27  --  27 26  GLUCOSE 227*  --  95 192*  BUN 55*  --  55* 69*  CREATININE 1.67*  --  2.98* 2.78*  CALCIUM 9.4  --  8.7* 8.4*  MG  --  2.2  --   --    GFR: Estimated Creatinine Clearance: 25.3 mL/min (by C-G formula based on Cr of 2.78). Liver Function Tests: No results for input(s): AST, ALT, ALKPHOS, BILITOT, PROT, ALBUMIN in the last 168 hours. No results for input(s): LIPASE, AMYLASE in the last 168 hours. No results for input(s): AMMONIA in the last 168 hours. Coagulation Profile: No results for input(s): INR, PROTIME in the last 168  hours. Cardiac Enzymes:  Recent Labs Lab 05/19/15 0048 05/20/15 0434 05/20/15 0859 05/20/15 1651  TROPONINI 0.05* 0.08* 0.05* 0.04*   BNP (last 3 results) No results for input(s): PROBNP in the last 8760 hours. HbA1C:  Recent Labs  05/20/15 0434  HGBA1C 8.5*   CBG:  Recent Labs Lab 05/21/15 1652 05/21/15 2308 05/22/15 0806 05/22/15 1203 05/22/15 1745  GLUCAP 170* 260* 141* 124* 182*   Lipid Profile: No results for input(s): CHOL, HDL, LDLCALC, TRIG, CHOLHDL, LDLDIRECT in the last 72 hours. Thyroid Function Tests:  Recent Labs  05/20/15 0434  TSH 1.555  FREET4 0.98   Anemia Panel: No results for input(s): VITAMINB12, FOLATE, FERRITIN, TIBC, IRON, RETICCTPCT in the last 72 hours. Urine analysis:    Component Value Date/Time   COLORURINE YELLOW 05/20/2015 0148   APPEARANCEUR CLEAR 05/20/2015 0148   LABSPEC 1.016 05/20/2015 0148   PHURINE 5.0 05/20/2015 0148   GLUCOSEU 100* 05/20/2015 0148   GLUCOSEU >=1000 01/02/2009 1101   HGBUR NEGATIVE 05/20/2015 0148   HGBUR negative 03/25/2008 1022   BILIRUBINUR NEGATIVE 05/20/2015 0148   KETONESUR NEGATIVE 05/20/2015 0148   PROTEINUR 100* 05/20/2015 0148   UROBILINOGEN 0.2 09/14/2013 1632   NITRITE NEGATIVE 05/20/2015 0148   LEUKOCYTESUR NEGATIVE 05/20/2015 0148   Sepsis Labs: @LABRCNTIP (procalcitonin:4,lacticidven:4)  ) Recent Results (from the past 240 hour(s))  MRSA PCR Screening     Status: None   Collection Time: 05/20/15  4:12 AM  Result Value Ref Range Status   MRSA by PCR NEGATIVE NEGATIVE Final    Comment:        The GeneXpert MRSA Assay (FDA approved for NASAL specimens only), is one component of a comprehensive MRSA colonization surveillance program. It is not intended to diagnose MRSA infection nor to guide or monitor treatment for MRSA infections.          Radiology Studies: Dg Hip Unilat With Pelvis 2-3 Views Left  05/22/2015  CLINICAL DATA:  LEFT hip pain EXAM: DG HIP (WITH OR  WITHOUT PELVIS) 2-3V LEFT COMPARISON:  None FINDINGS: Diffuse osseous demineralization. Degenerative disc and facet disease changes at visualized lower lumbar spine. Hip joints symmetric and preserved. No acute fracture, subluxation, or bone destruction. SI joints symmetric. IMPRESSION: No acute LEFT hip abnormalities. Degenerative changes at visualized lower lumbar spine. Electronically Signed   By: Lavonia Dana M.D.   On: 05/22/2015 17:29        Scheduled Meds: . antiseptic oral rinse  7 mL Mouth Rinse BID  . aspirin EC  81 mg Oral Daily  . atorvastatin  40 mg Oral QHS  . cyclobenzaprine  10  mg Oral QHS  . donepezil  10 mg Oral QHS  . doxazosin  2 mg Oral QHS  . ferrous sulfate  325 mg Oral Q breakfast  . [START ON 05/26/2015] ferumoxytol  510 mg Intravenous Once  . fluticasone  2 spray Each Nare Daily  . gabapentin  300 mg Oral BID  . heparin  5,000 Units Subcutaneous Q8H  . insulin aspart  0-15 Units Subcutaneous TID WC  . insulin glargine  25 Units Subcutaneous QHS  . isosorbide mononitrate  120 mg Oral Daily  . metoCLOPramide  10 mg Oral TID AC & HS  . mometasone-formoterol  2 puff Inhalation BID  . montelukast  10 mg Oral Daily  . multivitamin with minerals   Oral Daily  . pantoprazole  40 mg Oral Daily  . raloxifene  60 mg Oral Daily  . sucralfate  1 g Oral BID WC  . Vitamin D (Ergocalciferol)  50,000 Units Oral Q Mon   Continuous Infusions: . sodium chloride 100 mL/hr at 05/21/15 2021     LOS: 2 days    Time spent: 30 minutes.     Hosie Poisson, MD [facility] Pager [phone]  If 7PM-7AM, please contact night-coverage [email or web address] Password [email or web address] 05/22/2015, 6:22 PM

## 2015-05-22 NOTE — Care Management Note (Signed)
Case Management Note  Patient Details  Name: Joann Gutierrez MRN: [id number] Date of Birth: 1945-09-21  Subjective/Objective:                 Spoke with patient and her spouse at the bedside. Patient admitted with HTN, and Acute Renal Fx. On IVF. Patient lives at home with her spouse. She is on 2L of O2 at home supplied through [facility]. She has a walker, a cane, a toilet seat riser, CPAP, and a new nebulizer. Her husband drives her to appointments, and she denies any DME or mediaction needs at this time.   Action/Plan:  Will continue to follow.  Expected Discharge Date:  05/23/15               Expected Discharge Plan:  Home/Self Care  In-House Referral:     Discharge planning Services  CM Consult  Post Acute Care Choice:    Choice offered to:     DME Arranged:    DME Agency:     HH Arranged:    HH Agency:     Status of Service:  In process, will continue to follow  Medicare Important Message Given:    Date Medicare IM Given:    Medicare IM give by:    Date Additional Medicare IM Given:    Additional Medicare Important Message give by:     If discussed at [place] of Stay Meetings, dates discussed:    Additional Comments:  Carles Collet, RN 05/22/2015, 12:35 PM

## 2015-05-22 NOTE — Progress Notes (Signed)
RT palced patient on CPAP of 6cmH2O with 2L O2 bleed in at patient's request. Patient is tolerating at this time. RT will continue to monitor.

## 2015-05-23 ENCOUNTER — Inpatient Hospital Stay (HOSPITAL_COMMUNITY): Payer: Medicare Other

## 2015-05-23 DIAGNOSIS — I360 Nonrheumatic tricuspid (valve) stenosis: Secondary | ICD-10-CM

## 2015-05-23 DIAGNOSIS — N183 Chronic kidney disease, stage 3 (moderate): Secondary | ICD-10-CM

## 2015-05-23 LAB — GLUCOSE, CAPILLARY
GLUCOSE-CAPILLARY: 190 mg/dL — AB (ref 65–99)
GLUCOSE-CAPILLARY: 111 mg/dL — AB (ref 65–99)
GLUCOSE-CAPILLARY: 164 mg/dL — AB (ref 65–99)
Glucose-Capillary: 199 mg/dL — ABNORMAL HIGH (ref 65–99)
Glucose-Capillary: 185 mg/dL — ABNORMAL HIGH (ref 65–99)

## 2015-05-23 LAB — SODIUM, URINE, RANDOM: Sodium, Ur: 56 mmol/L

## 2015-05-23 LAB — BASIC METABOLIC PANEL
ANION GAP: 8 (ref 5–15)
BUN: 59 mg/dL — ABNORMAL HIGH (ref 6–20)
CHLORIDE: 110 mmol/L (ref 101–111)
CO2: 26 mmol/L (ref 22–32)
CREATININE: 1.98 mg/dL — AB (ref 0.44–1.00)
Calcium: 8.3 mg/dL — ABNORMAL LOW (ref 8.9–10.3)
GFR calc non Af Amer: 24 mL/min — ABNORMAL LOW (ref >60)
GFR, EST AFRICAN AMERICAN: 28 mL/min — AB (ref >60)
Glucose, Bld: 122 mg/dL — ABNORMAL HIGH (ref 65–99)
POTASSIUM: 4 mmol/L (ref 3.5–5.1)
SODIUM: 144 mmol/L (ref 135–145)

## 2015-05-23 LAB — CREATININE, URINE, RANDOM: CREATININE, URINE: 81.07 mg/dL

## 2015-05-23 LAB — ECHOCARDIOGRAM COMPLETE
Height: 64 in
WEIGHTICAEL: 4772.52 [oz_av]

## 2015-05-23 MED ORDER — ISOSORBIDE MONONITRATE ER 30 MG PO TB24
30.0000 mg | ORAL_TABLET | Freq: Every day | ORAL | Status: DC
  Administered 2015-05-24: 30 mg via ORAL
  Filled 2015-05-23: qty 1

## 2015-05-23 NOTE — Progress Notes (Signed)
  Echocardiogram 2D Echocardiogram has been performed.  Lean Fayson 05/23/2015, 10:21 AM

## 2015-05-23 NOTE — Evaluation (Signed)
Physical Therapy Evaluation Patient Details Name: Joann Gutierrez MRN: [id number] DOB: July 20, 1945 Today's Date: 05/23/2015   History of Present Illness  Patient is a 70 y/o female with hx of CHF, COPD, morbid obesity, CVA, CKD, DM, hearing loss both ears and sickle cell trait presents with Headache, generalized weakness, malaise. Found to have elevated BP 240/130 in ED and admitted for HTN emergency. CXR-mild vascular congestion and mild cardiomegaly with increased interstitial markings consistent with mild interstitial edema  Clinical Impression  Patient presents with generalized weakness, dyspnea on exertion, and impaired endurance impacting mobility. Tolerated short distance ambulation with Min guard assist for safety. Pt has support from spouse at home. Encouraged daily ambulation to improve strength/mobility while in hospital. Will follow acutely to maximize independence and mobility prior to return home.    Follow Up Recommendations Home health PT;Supervision/Assistance - 24 hour    Equipment Recommendations  None recommended by PT    Recommendations for Other Services OT consult     Precautions / Restrictions Precautions Precautions: Fall Restrictions Weight Bearing Restrictions: No      Mobility  Bed Mobility Overal bed mobility: Needs Assistance Bed Mobility: Rolling;Sidelying to Sit Rolling: Min guard Sidelying to sit: Min guard;HOB elevated       General bed mobility comments: increased time with heavy use of rail. No assist to get to EOB.   Transfers Overall transfer level: Needs assistance Equipment used: Rolling walker (2 wheeled) Transfers: Sit to/from Stand Sit to Stand: Min assist         General transfer comment: Min A to boost from EOB with cues for technique. transferred to chair post ambulation bout.  Ambulation/Gait Ambulation/Gait assistance: Min guard Ambulation Distance (Feet): 15 Feet Assistive device: Rolling walker (2 wheeled) (bari  RW) Gait Pattern/deviations: Step-through pattern;Decreased stride length;Wide base of support;Trunk flexed Gait velocity: decreased   General Gait Details: Slow, shaky gait using RW. DOE. Bil knee instability and left hip pain during gait.  Stairs            Wheelchair Mobility    Modified Rankin (Stroke Patients Only)       Balance Overall balance assessment: Needs assistance Sitting-balance support: Feet supported;No upper extremity supported Sitting balance-[name]: Fair     Standing balance support: During functional activity Standing balance-[name]: Poor Standing balance comment: Reliant on BUes for support. Total A for pericare.                             Pertinent Vitals/Pain Pain Assessment: Faces Faces Pain Scale: Hurts little more Pain Location: left hip Pain Descriptors / Indicators: Sore Pain Intervention(s): Monitored during session;Repositioned    Home Living Family/patient expects to be discharged to:: Private residence Living Arrangements: Spouse/significant other Available Help at Discharge: Family;Available 24 hours/day Type of Home: House Home Access: Stairs to enter Entrance Stairs-Rails: Psychiatric nurse of Steps: 6 Home Layout: One level Home Equipment: Walker - 2 wheels;Cane - single point;Toilet riser      Prior Function Level of Independence: Needs assistance   Gait / Transfers Assistance Needed: Uses SPC for ambulation.  ADL's / Homemaking Assistance Needed: Reports assist with ADLs PTA.  Comments: Wears 02 at home at baseline.     Hand Dominance   Dominant Hand: Right    Extremity/Trunk Assessment   Upper Extremity Assessment: Defer to OT evaluation           Lower Extremity Assessment: Generalized weakness  Communication   Communication: No difficulties  Cognition Arousal/Alertness: Awake/alert Behavior During Therapy: WFL for tasks assessed/performed Overall  Cognitive Status: Within Functional Limits for tasks assessed                      General Comments General comments (skin integrity, edema, etc.): Pt very HOH. Wears glasses.    Exercises        Assessment/Plan    PT Assessment Patient needs continued PT services  PT Diagnosis Difficulty walking;Generalized weakness   PT Problem List Decreased strength;Pain;Cardiopulmonary status limiting activity;Decreased balance;Decreased mobility  PT Treatment Interventions Balance training;Gait training;Therapeutic activities;Therapeutic exercise;Functional mobility training;Patient/family education;Stair training   PT Goals (Current goals can be found in the Care Plan section) Acute Rehab PT Goals Patient Stated Goal: none stated PT Goal Formulation: With patient Time For Goal Achievement: 06/06/15 Potential to Achieve Goals: Fair    Frequency Min 3X/week   Barriers to discharge        Co-evaluation               End of Session Equipment Utilized During Treatment: Gait belt Activity Tolerance: Patient limited by fatigue Patient left: in chair;with call bell/phone within reach;with chair alarm set Nurse Communication: Mobility status         Time: [id number] PT Time Calculation (min) (ACUTE ONLY): 17 min   Charges:   PT Evaluation $PT Eval Moderate Complexity: 1 Procedure     PT G Codes:        Adolfo Granieri A Wesson Stith 05/23/2015, 12:27 PM Wray Kearns, [place], DPT [phone]

## 2015-05-23 NOTE — Care Management Note (Signed)
Case Management Note  Patient Details  Name: Joann Gutierrez MRN: [id number] Date of Birth: 1945-01-23  Subjective/Objective:                 Spoke with patient and her spouse at the bedside. Patient admitted with HTN, and Acute Renal Fx. On IVF. Patient lives at home with her spouse. She is on 2L of O2 at home supplied through [facility]. She has a walker, a cane, a toilet seat riser, CPAP, and a new nebulizer. Her husband drives her to appointments, and she denies any DME or mediaction needs at this time.    Action/Plan:  Referral placed for [facility] PT with [facility]  Expected Discharge Date:  05/23/15               Expected Discharge Plan:  [place]  In-House Referral:     Discharge planning Services  CM Consult  Post Acute Care Choice:    Choice offered to:     DME Arranged:    DME Agency:     HH Arranged:  PT [place]:  [place]  Status of Service:  In process, will continue to follow  Medicare Important Message Given:  Yes Date Medicare IM Given:    Medicare IM give by:    Date Additional Medicare IM Given:    Additional Medicare Important Message give by:     If discussed at [place] of Stay Meetings, dates discussed:    Additional Comments:  Carles Collet, RN 05/23/2015, 1:50 PM

## 2015-05-23 NOTE — Care Management Important Message (Signed)
Important Message  Patient Details  Name: Joann Gutierrez MRN: [id number] Date of Birth: 21-Oct-1945   Medicare Important Message Given:  Yes    Nathen May 05/23/2015, 12:25 PM

## 2015-05-23 NOTE — Progress Notes (Signed)
PT Cancellation Note  Patient Details Name: Joann Gutierrez MRN: [id number] DOB: May 24, 1945   Cancelled Treatment:    Reason Eval/Treat Not Completed: Patient at procedure or test/unavailable  Pt off floor. Will follow up next available time.  [name] 05/23/2015, 10:23 AM Wray Kearns, PT, DPT [phone]

## 2015-05-23 NOTE — Progress Notes (Signed)
PROGRESS NOTE    MAXI LOUGEE  [id number] DOB: 1945-07-23 DOA: 05/19/2015 PCP: Kandice Hams, MD     Brief Narrative: Joann Gutierrez is a 70 y.o. female with medical history significant for insulin-dependent diabetes mellitus, hypertension, chronic kidney disease stage III, CAD, and chronic diastolic CHF who presents to the ED with 1 day of headaches, generalized weakness, and malaise.    Assessment & Plan:   Principal Problem:   Hypertensive emergency Active Problems:   HYPERCHOLESTEROLEMIA   Gastroparesis   Microcytic anemia   CKD (chronic kidney disease), stage III   Hypertension   Insulin dependent diabetes mellitus (HCC)   Hypokalemia   AKI (acute kidney injury) ([place])   Accelerated Hypertension: Improved. Off the nitro paste.  D/c hydralazine and decrease the dose of imdur. Lasix and metolazone held for acute renal failure.  bp parameters soft this am, and hydralazine discontinued.  Started on IV fluids.    Acute on CKD STAGE 3.: Possibly pre renal from hypoperfusion from low blood pressures on admission .  IV hydrate and repeat renal parameters in am show some improvement.  Continue with IV fluids and monitor renal parametrs.    Insulin Dependent DM: CBG (last 3)   Recent Labs  05/22/15 2128 05/23/15 0817 05/23/15 1246  GLUCAP 190* 111* 164*    Resume lantus and ssi.    Left hip pain : X rays of the hip degenerative changes, no fracture seen.. No fall.   Epigastric abdominal pain: Un clear etiology. Get US abdomen. Negative for acute pathology.    DVT prophylaxis: /Heparin/ Code Status: full code.  Family Communication: none at bedside, discussed with patient.  Disposition Plan: , possibly home with home PT.when renal parameters improve.    Consultants:   none   Procedures: none   Antimicrobials:none   Subjective: Reports feeling better, now that she is out of bed.   Objective: Filed Vitals:   05/22/15 2131  05/23/15 0500 05/23/15 0506 05/23/15 1304  BP: 135/50  107/48 161/62  Pulse: 91  83 77  Temp: 98.6 F (37 C)  98.6 F (37 C) 98.5 F (36.9 C)  TempSrc:   Oral Oral  Resp: 17  20 20   Height:      Weight:  135.3 kg (298 lb 4.5 oz)    SpO2: 97%  95% 97%    Intake/Output Summary (Last 24 hours) at 05/23/15 1732 Last data filed at 05/23/15 1343  Gross per 24 hour  Intake   1260 ml  Output    520 ml  Net    740 ml   Filed Weights   05/21/15 0500 05/22/15 0528 05/23/15 0500  Weight: 130.2 kg (287 lb 0.6 oz) 130.9 kg (288 lb 9.3 oz) 135.3 kg (298 lb 4.5 oz)    Examination:  General exam: Appears calm and comfortable  Respiratory system: Clear to auscultation. Respiratory effort normal.on [place] oxygen.  Cardiovascular system: S1 & S2 heard, RRR. No JVD, murmurs, rubs, gallops or clicks. No pedal edema. Gastrointestinal system: Abdomen is nondistended, soft and nontender. No organomegaly or masses felt. Normal bowel sounds heard. Central nervous system: Alert and oriented. No focal neurological deficits. Extremities: Symmetric 5 x 5 power. Skin: No rashes, lesions or ulcers     Data Reviewed: I have personally reviewed following labs and imaging studies  CBC:  Recent Labs Lab 05/19/15 1033 05/19/15 2358 05/20/15 1651  WBC  --  7.8 10.7*  HGB 11.2* 11.4* 10.1*  HCT  --  36.4  32.9*  MCV  --  72.1* 72.3*  PLT  --  240 [id number]   Basic Metabolic Panel:  Recent Labs Lab 05/19/15 2358 05/20/15 0859 05/20/15 1651 05/22/15 0623 05/23/15 0707  NA 142  --  142 142 144  K 3.3*  --  3.5 3.7 4.0  CL 103  --  106 106 110  CO2 27  --  27 26 26   GLUCOSE 227*  --  95 192* 122*  BUN 55*  --  55* 69* 59*  CREATININE 1.67*  --  2.98* 2.78* 1.98*  CALCIUM 9.4  --  8.7* 8.4* 8.3*  MG  --  2.2  --   --   --    GFR: Estimated Creatinine Clearance: 36.3 mL/min (by C-G formula based on Cr of 1.98). Liver Function Tests: No results for input(s): AST, ALT, ALKPHOS, BILITOT, PROT,  ALBUMIN in the last 168 hours. No results for input(s): LIPASE, AMYLASE in the last 168 hours. No results for input(s): AMMONIA in the last 168 hours. Coagulation Profile: No results for input(s): INR, PROTIME in the last 168 hours. Cardiac Enzymes:  Recent Labs Lab 05/19/15 0048 05/20/15 0434 05/20/15 0859 05/20/15 1651  TROPONINI 0.05* 0.08* 0.05* 0.04*   BNP (last 3 results) No results for input(s): PROBNP in the last 8760 hours. HbA1C: No results for input(s): HGBA1C in the last 72 hours. CBG:  Recent Labs Lab 05/22/15 1203 05/22/15 1745 05/22/15 2128 05/23/15 0817 05/23/15 1246  GLUCAP 124* 182* 190* 111* 164*   Lipid Profile: No results for input(s): CHOL, HDL, LDLCALC, TRIG, CHOLHDL, LDLDIRECT in the last 72 hours. Thyroid Function Tests: No results for input(s): TSH, T4TOTAL, FREET4, T3FREE, THYROIDAB in the last 72 hours. Anemia Panel: No results for input(s): VITAMINB12, FOLATE, FERRITIN, TIBC, IRON, RETICCTPCT in the last 72 hours. Urine analysis:    Component Value Date/Time   COLORURINE YELLOW 05/20/2015 0148   APPEARANCEUR CLEAR 05/20/2015 0148   LABSPEC 1.016 05/20/2015 0148   PHURINE 5.0 05/20/2015 0148   GLUCOSEU 100* 05/20/2015 0148   GLUCOSEU >=1000 01/02/2009 1101   HGBUR NEGATIVE 05/20/2015 0148   HGBUR negative 03/25/2008 1022   BILIRUBINUR NEGATIVE 05/20/2015 0148   KETONESUR NEGATIVE 05/20/2015 0148   PROTEINUR 100* 05/20/2015 0148   UROBILINOGEN 0.2 09/14/2013 1632   NITRITE NEGATIVE 05/20/2015 0148   LEUKOCYTESUR NEGATIVE 05/20/2015 0148   Sepsis Labs: @LABRCNTIP (procalcitonin:4,lacticidven:4)  ) Recent Results (from the past 240 hour(s))  MRSA PCR Screening     Status: None   Collection Time: 05/20/15  4:12 AM  Result Value Ref Range Status   MRSA by PCR NEGATIVE NEGATIVE Final    Comment:        The GeneXpert MRSA Assay (FDA approved for NASAL specimens only), is one component of a comprehensive MRSA  colonization surveillance program. It is not intended to diagnose MRSA infection nor to guide or monitor treatment for MRSA infections.          Radiology Studies: US Abdomen Complete  05/23/2015  CLINICAL DATA:  Abdominal pain. EXAM: ABDOMEN ULTRASOUND COMPLETE COMPARISON:  09/15/2013. FINDINGS: Gallbladder: No gallstones noted. 6 mm echodensity noted the anterior gallbladder wall. This non mobile. This consistent polyp. Gallbladder wall thickness 2.1 mm. Negative Murphy sign. Common bile duct: Diameter: 4.6 mm Liver: No focal lesion identified. Within normal limits in parenchymal echogenicity. IVC: No abnormality visualized. Pancreas: Visualized portion unremarkable. Spleen: Size and appearance within normal limits. Right Kidney: Length: 10.2 cm. Echogenicity within normal limits. No solid mass or  hydronephrosis visualized. 1.2 cm simple cyst. Left Kidney: Length: 10.6 cm. Echogenicity within normal limits. No solid mass or hydronephrosis visualized. 1.5 cm and 3.2 cm simple cyst. Abdominal aorta: No aneurysm visualized. Other findings: None. IMPRESSION: 1. 6 mm gallbladder polyp. 2.  Simple cysts both kidneys. Electronically Signed   By: Marcello Moores  Register   On: 05/23/2015 09:13   Dg Hip Unilat With Pelvis 2-3 Views Left  05/22/2015  CLINICAL DATA:  LEFT hip pain EXAM: DG HIP (WITH OR WITHOUT PELVIS) 2-3V LEFT COMPARISON:  None FINDINGS: Diffuse osseous demineralization. Degenerative disc and facet disease changes at visualized lower lumbar spine. Hip joints symmetric and preserved. No acute fracture, subluxation, or bone destruction. SI joints symmetric. IMPRESSION: No acute LEFT hip abnormalities. Degenerative changes at visualized lower lumbar spine. Electronically Signed   By: Lavonia Dana M.D.   On: 05/22/2015 17:29        Scheduled Meds: . antiseptic oral rinse  7 mL Mouth Rinse BID  . aspirin EC  81 mg Oral Daily  . atorvastatin  40 mg Oral QHS  . cyclobenzaprine  10 mg Oral QHS  .  donepezil  10 mg Oral QHS  . doxazosin  2 mg Oral QHS  . ferrous sulfate  325 mg Oral Q breakfast  . [START ON 05/26/2015] ferumoxytol  510 mg Intravenous Once  . fluticasone  2 spray Each Nare Daily  . gabapentin  300 mg Oral BID  . heparin  5,000 Units Subcutaneous Q8H  . insulin aspart  0-15 Units Subcutaneous TID WC  . insulin glargine  25 Units Subcutaneous QHS  . [START ON 05/24/2015] isosorbide mononitrate  30 mg Oral Daily  . metoCLOPramide  10 mg Oral TID AC & HS  . mometasone-formoterol  2 puff Inhalation BID  . montelukast  10 mg Oral Daily  . multivitamin with minerals   Oral Daily  . pantoprazole  40 mg Oral Daily  . raloxifene  60 mg Oral Daily  . sucralfate  1 g Oral BID WC  . Vitamin D (Ergocalciferol)  50,000 Units Oral Q Mon   Continuous Infusions: . sodium chloride 100 mL/hr at 05/23/15 0454     LOS: 3 days    Time spent: 30 minutes.     Hosie Poisson, MD [facility] Pager [phone]  If 7PM-7AM, please contact night-coverage [email or web address] Password [facility] 05/23/2015, 5:32 PM

## 2015-05-24 LAB — BASIC METABOLIC PANEL
ANION GAP: 9 (ref 5–15)
BUN: 43 mg/dL — ABNORMAL HIGH (ref 6–20)
CALCIUM: 8.2 mg/dL — AB (ref 8.9–10.3)
CHLORIDE: 111 mmol/L (ref 101–111)
CO2: 23 mmol/L (ref 22–32)
CREATININE: 1.54 mg/dL — AB (ref 0.44–1.00)
GFR calc non Af Amer: 33 mL/min — ABNORMAL LOW (ref >60)
GFR, EST AFRICAN AMERICAN: 38 mL/min — AB (ref >60)
Glucose, Bld: 108 mg/dL — ABNORMAL HIGH (ref 65–99)
Potassium: 3.9 mmol/L (ref 3.5–5.1)
SODIUM: 143 mmol/L (ref 135–145)

## 2015-05-24 LAB — GLUCOSE, CAPILLARY: GLUCOSE-CAPILLARY: 79 mg/dL (ref 65–99)

## 2015-05-24 MED ORDER — FUROSEMIDE 80 MG PO TABS: 80.0000 mg | ORAL_TABLET | Freq: Two times a day (BID) | ORAL | Status: DC

## 2015-05-24 MED ORDER — METOLAZONE 2.5 MG PO TABS: 2.5000 mg | ORAL_TABLET | ORAL | Status: DC

## 2015-05-24 MED ORDER — ISOSORBIDE MONONITRATE ER 60 MG PO TB24: 120.0000 mg | ORAL_TABLET | Freq: Every day | ORAL | Status: DC

## 2015-05-24 NOTE — Consult Note (Signed)
   [facility] CM Inpatient Consult   05/24/2015  [name] Mar 27, 1945 [id number]   Patient is active with [place] Management Telephonic [facility] program. Please refer to chart review tab then notes for further patient outreach details from [place] Management team. Spoke with patient and husband at bedside to make aware that she will now be referred to [facility] for post hospital transition of care calls and potential monthly home visits. She is agreeable to this. Explained that [place] Management will not interfere or replace services provided by home health. It appears she will have home health services as well. Written consent is already on file. Contact information left at bedside. Appreciative of visit. Sent notification to inpatient RNCM that patient is active with [place] Management program.   Will request for patient to be assigned to [place].  [name], MSN-Ed, RN,BSN [facility] Liaison [phone]

## 2015-05-24 NOTE — Care Management Note (Signed)
Case Management Note  Patient Details  Name: Joann Gutierrez MRN: [id number] Date of Birth: June 29, 1945  Subjective/Objective:                 Spoke with patient and her spouse at the bedside. Patient admitted with HTN, and Acute Renal Fx. On IVF. Patient lives at home with her spouse. She is on 2L of O2 at home supplied through [facility]. She has a walker, a cane, a toilet seat riser, CPAP, and a new nebulizer. Her husband drives her to appointments, and she denies any DME or mediaction needs at this time.   Action/Plan:  Dc to home with [facility] PT and RN Expected Discharge Date:  05/23/15               Expected Discharge Plan:  [place]  In-House Referral:     Discharge planning Services  CM Consult  Post Acute Care Choice:  Home Health Choice offered to:  Patient, Spouse  DME Arranged:    DME Agency:     HH Arranged:  PT, RN [place] Agency:  [place]  Status of Service:  Completed, signed off  Medicare Important Message Given:  Yes Date Medicare IM Given:    Medicare IM give by:    Date Additional Medicare IM Given:    Additional Medicare Important Message give by:     If discussed at [place] of Stay Meetings, dates discussed:    Additional Comments:  Carles Collet, RN 05/24/2015, 10:14 AM

## 2015-05-24 NOTE — Consult Note (Signed)
   [facility] CM Inpatient Consult   05/24/2015  Joann Gutierrez Aug 18, 1945 [id number]   Patient is currently active with [place] Management for chronic disease management services.  Patient has been engaged by a [facility].  Our community based plan of care has focused on disease management and community resource support. Consent on file.   Patient will receive a post discharge transition of care call and will be evaluated for monthly home visits for assessments and disease process education.  Made Inpatient Case Manager, Jackelyn Poling,  aware that [place] Management following. Of note, [facility] Care Management services does not replace or interfere with any services that are needed or arranged by inpatient case management or social work.  For additional questions or referrals please contact: Natividad Brood, RN BSN [facility] Liaison  [phone] business mobile phone Toll free office [phone]

## 2015-05-24 NOTE — Progress Notes (Signed)
NURSING PROGRESS NOTE  Joann Gutierrez [id number] Discharge Data: 05/24/2015 11:53 AM Attending Provider: Hosie Poisson, MD [id number] D, MD     Singer to be D/C'd Home per MD order.  Discussed with the patient the After Visit Summary and all questions fully answered. All IV's discontinued with no bleeding noted. All belongings returned to patient for patient to take home.   Last Vital Signs:  Blood pressure 144/69, pulse 69, temperature 98.3 F (36.8 C), temperature source Oral, resp. rate 18, height 5\' 4"  (1.626 m), weight 135.3 kg (298 lb 4.5 oz), SpO2 94 %.  Discharge Medication List   Medication List    STOP taking these medications        hydrALAZINE 100 MG tablet  Commonly known as:  APRESOLINE     losartan 100 MG tablet  Commonly known as:  COZAAR      TAKE these medications        albuterol 108 (90 Base) MCG/ACT inhaler  Commonly known as:  VENTOLIN HFA  Inhale 2 puffs into the lungs every 6 (six) hours as needed for shortness of breath.     aspirin 325 MG tablet  Take 325 mg by mouth daily.     atorvastatin 40 MG tablet  Commonly known as:  LIPITOR  Take 1 tablet (40 mg total) by mouth daily.     CALTRATE PLUS PO  Take 600 mg by mouth every morning.     Cod Liver Oil Caps  Take 1 capsule by mouth daily.     cyclobenzaprine 10 MG tablet  Commonly known as:  FLEXERIL  Take 10 mg by mouth at bedtime. Reported on 02/07/2015     dextromethorphan-guaiFENesin 10-100 MG/5ML liquid  Commonly known as:  ROBITUSSIN-DM  Take 10 mLs by mouth every 4 (four) hours as needed for cough. Reported on 01/25/2015     donepezil 10 MG tablet  Commonly known as:  ARICEPT  Take 10 mg by mouth at bedtime.     doxazosin 2 MG tablet  Commonly known as:  CARDURA  Take 1 tablet by mouth at bedtime.     ezetimibe 10 MG tablet  Commonly known as:  ZETIA  Take 10 mg by mouth at bedtime.     ferrous sulfate 325 (65 FE) MG EC tablet  Take 325 mg by mouth  every morning. Reported on 01/25/2015     Fish Oil 1000 MG Caps  Take 1 capsule by mouth daily.     fluticasone 50 MCG/ACT nasal spray  Commonly known as:  FLONASE  Place 2 sprays into both nostrils daily.     furosemide 80 MG tablet  Commonly known as:  LASIX  Take 1 tablet (80 mg total) by mouth 2 (two) times daily.  Start taking on:  05/28/2015     gabapentin 300 MG capsule  Commonly known as:  NEURONTIN  Take 1 capsule (300 mg total) by mouth 2 (two) times daily.     HUMULIN N 100 UNIT/ML injection  Generic drug:  insulin NPH Human  Inject 2-13 Units into the skin 3 (three) times daily after meals. Reported on 01/25/2015     insulin glargine 100 UNIT/ML injection  Commonly known as:  LANTUS  Inject 0.15 mLs (15 Units total) into the skin daily.     isosorbide mononitrate 120 MG 24 hr tablet  Commonly known as:  IMDUR  Take 1 tablet (120 mg total) by mouth daily. PATIENT NEEDS TO CONTACT OFFICE FOR ADDITIONAL  REFILLS     KLOR-CON M20 20 MEQ tablet  Generic drug:  potassium chloride SA  Take 1 tablet by mouth daily.     LIDODERM 5 %  Generic drug:  lidocaine  Place 1 patch onto the skin daily as needed (pain). Reported on 02/07/2015     metoCLOPramide 10 MG tablet  Commonly known as:  REGLAN  TAKE ONE TABLET BY MOUTH BEFORE MEALS AND AT BEDTIME     metolazone 2.5 MG tablet  Commonly known as:  ZAROXOLYN  Take 1 tablet (2.5 mg total) by mouth 3 (three) times a week. Take 1 tablet by mouth on Monday, Wednesday and Friday     montelukast 10 MG tablet  Commonly known as:  SINGULAIR  Take 1 tablet (10 mg total) by mouth daily.     MULTIVITAMIN GUMMIES ADULT Chew  Chew 1 capsule by mouth daily.     nitroGLYCERIN 0.4 MG SL tablet  Commonly known as:  NITROSTAT  PLACE ONE TABLET UNDER THE TONGUE EVERY FIVE MINUTES AS NEEDED FOR CHEST PAIN     omeprazole 40 MG capsule  Commonly known as:  PRILOSEC  Take one po daily     OXYGEN  Inhale into the lungs. CPAP with oxygen  at bedtime     raloxifene 60 MG tablet  Commonly known as:  EVISTA  Take 60 mg by mouth every morning.     sucralfate 1 GM/10ML suspension  Commonly known as:  CARAFATE  Take 10 mLs (1 g total) by mouth 2 (two) times daily.     SYMBICORT 160-4.5 MCG/ACT inhaler  Generic drug:  budesonide-formoterol  INHALE 2 PUFFS INTO LUNGS 2 TIMES DAILY     traMADol 50 MG tablet  Commonly known as:  ULTRAM  Take 50 mg by mouth every 6 (six) hours as needed for moderate pain. Reported on 02/07/2015     Vitamin D (Ergocalciferol) 50000 units Caps capsule  Commonly known as:  DRISDOL  Take 50,000 Units by mouth every Monday.         Doristine Devoid, RN

## 2015-05-25 ENCOUNTER — Other Ambulatory Visit (HOSPITAL_COMMUNITY): Payer: Self-pay | Admitting: *Deleted

## 2015-05-25 NOTE — Discharge Summary (Signed)
Physician Discharge Summary  [name] [id number] DOB: [birth year] DOA: 05/19/2015  PCP: Kandice Hams, MD  Admit date: 05/19/2015 Discharge date: 05/24/2015  Time spent: 30 minutes  Recommendations for Outpatient Follow-up:  1. Follow up with home health PT/RN.  2. Follow up with PCP in one week. ,please obtain renal parameters before restarting the lasix and metolazone.    Discharge Diagnoses:  Principal Problem:   Hypertensive emergency Active Problems:   HYPERCHOLESTEROLEMIA   Gastroparesis   Microcytic anemia   CKD (chronic kidney disease), stage III   Hypertension   Insulin dependent diabetes mellitus ([place])   Hypokalemia   AKI (acute kidney injury) ([place])   Discharge Condition: improved.  Diet recommendation: low sodium carb modified diet.  Filed Weights   05/21/15 0500 05/22/15 0528 05/23/15 0500  Weight: 130.2 kg (287 lb 0.6 oz) 130.9 kg (288 lb 9.3 oz) 135.3 kg (298 lb 4.5 oz)    History of present illness:  Joann Gutierrez is a 70 y.o. female with medical history significant for insulin-dependent diabetes mellitus, hypertension, chronic kidney disease stage III, CAD, and chronic diastolic CHF who presents to the ED with 1 day of headaches, generalized weakness, and malaise.  Hospital Course:  Accelerated Hypertension: Improved. Off the nitro paste.  D/c hydralazine and decrease the dose of imdur. Lasix and metolazone held for acute renal failure.  Restart the imdur at home dose.    Acute on CKD STAGE 3.: Possibly pre renal from hypoperfusion from low blood pressures on admission .  IV hydrate and repeat renal parameters in am show some improvement.  Outpatient follow up of the renal parameters in one week.  Holding lasix and metolazone for a few days and suggest repeating the renal parameters before restarting the meds.    Insulin Dependent DM: CBG (last 3)   Recent Labs (last 2 labs)      Recent Labs  05/22/15 2128  05/23/15 0817 05/23/15 1246  GLUCAP 190* 111* 164*      Resume lantus and ssi.    Left hip pain : X rays of the hip degenerative changes, no fracture seen.. No fall.   Epigastric abdominal pain: Un clear etiology. Get US abdomen which was  Negative for acute pathology.         Procedures:  none  Consultations: none Discharge Exam: Filed Vitals:   05/24/15 0613 05/24/15 0954  BP: 144/69   Pulse: 72 69  Temp: 98.3 F (36.8 C)   Resp: 18 18    General: alert comfortable.  Cardiovascular: s1s2 Respiratory: ctab  Discharge Instructions   Discharge Instructions    AMB Referral to [facility]    Complete by:  As directed   Please assign to [facility] for disease and symptom management for CHF, DM. Currently active with [facility]. To discharge home today 05/24/15. Will have home health as well. Thanks and please call with questions. [name], MSN-Ed, [facility] [phone]  Reason for consult:  Please assign to [facility] [place]. Currently active with telephonic [facility] RNCM  Diagnoses of:   Diabetes Heart Failure    Expected date of contact:  1-3 days (reserved for hospital discharges)     Diet - low sodium heart healthy    Complete by:  As directed      Discharge instructions    Complete by:  As directed   Follow up with PCP in one week.  We re holding lasix and metolazone for a few days  because of your recent acute kidney injury.  Please check your kidney function test before restarting the lasix and metolazone.          Discharge Medication List as of 05/24/2015 11:36 AM    CONTINUE these medications which have CHANGED   Details  furosemide (LASIX) 80 MG tablet Take 1 tablet (80 mg total) by mouth 2 (two) times daily., Starting 05/28/2015, Until Discontinued, No Print    metolazone (ZAROXOLYN) 2.5 MG tablet Take 1 tablet (2.5 mg total) by mouth 3 (three) times a week. Take 1 tablet by mouth on Monday, Wednesday and  Friday, Starting 05/24/2015, Until Discontinued, No Print      CONTINUE these medications which have NOT CHANGED   Details  albuterol (VENTOLIN HFA) 108 (90 BASE) MCG/ACT inhaler Inhale 2 puffs into the lungs every 6 (six) hours as needed for shortness of breath., Starting 11/10/2014, Until Discontinued, Normal    aspirin 325 MG tablet Take 325 mg by mouth daily. , Until Discontinued, Historical Med    atorvastatin (LIPITOR) 40 MG tablet Take 1 tablet (40 mg total) by mouth daily., Starting 09/07/2010, Until Discontinued, Normal    Calcium Carbonate-Vit D-Min (CALTRATE PLUS PO) Take 600 mg by mouth every morning. , Until Discontinued, Historical Med    Cod Liver Oil CAPS Take 1 capsule by mouth daily., Until Discontinued, Historical Med    cyclobenzaprine (FLEXERIL) 10 MG tablet Take 10 mg by mouth at bedtime. Reported on 02/07/2015, Until Discontinued, Historical Med    dextromethorphan-guaiFENesin (ROBITUSSIN-DM) 10-100 MG/5ML liquid Take 10 mLs by mouth every 4 (four) hours as needed for cough. Reported on 01/25/2015, Until Discontinued, Historical Med    donepezil (ARICEPT) 10 MG tablet Take 10 mg by mouth at bedtime. , Until Discontinued, Historical Med    doxazosin (CARDURA) 2 MG tablet Take 1 tablet by mouth at bedtime., Starting 10/10/2014, Until Discontinued, Historical Med    ezetimibe (ZETIA) 10 MG tablet Take 10 mg by mouth at bedtime. , Until Discontinued, Historical Med    ferrous sulfate 325 (65 FE) MG EC tablet Take 325 mg by mouth every morning. Reported on 01/25/2015, Until Discontinued, Historical Med    fluticasone (FLONASE) 50 MCG/ACT nasal spray Place 2 sprays into both nostrils daily., Starting 11/10/2014, Until Wed 08/27/17, Normal    gabapentin (NEURONTIN) 300 MG capsule Take 1 capsule (300 mg total) by mouth 2 (two) times daily., Starting 04/29/2011, Until Discontinued, Normal    insulin glargine (LANTUS) 100 UNIT/ML injection Inject 0.15 mLs (15 Units total) into the skin  daily., Starting 09/18/2013, Until Discontinued, Normal    insulin NPH (HUMULIN N) 100 UNIT/ML injection Inject 2-13 Units into the skin 3 (three) times daily after meals. Reported on 01/25/2015, Starting 07/07/2012, Until Discontinued, Historical Med    isosorbide mononitrate (IMDUR) 120 MG 24 hr tablet Take 1 tablet (120 mg total) by mouth daily. PATIENT NEEDS TO CONTACT OFFICE FOR ADDITIONAL REFILLS, Starting 01/11/2015, Until Discontinued, Normal    KLOR-CON M20 20 MEQ tablet Take 1 tablet by mouth daily. , Starting 06/19/2011, Until Discontinued, Historical Med    LIDODERM 5 % Place 1 patch onto the skin daily as needed (pain). Reported on 02/07/2015, Starting 10/20/2011, Until Discontinued, Historical Med    metoCLOPramide (REGLAN) 10 MG tablet TAKE ONE TABLET BY MOUTH BEFORE MEALS AND AT BEDTIME, Normal    montelukast (SINGULAIR) 10 MG tablet Take 1 tablet (10 mg total) by mouth daily., Starting 11/10/2014, Until Discontinued, Normal    Multiple Vitamins-Minerals (MULTIVITAMIN GUMMIES  ADULT) CHEW Chew 1 capsule by mouth daily., Until Discontinued, Historical Med    nitroGLYCERIN (NITROSTAT) 0.4 MG SL tablet PLACE ONE TABLET UNDER THE TONGUE EVERY FIVE MINUTES AS NEEDED FOR CHEST PAIN, Normal    Omega-3 Fatty Acids (FISH OIL) 1000 MG CAPS Take 1 capsule by mouth daily., Until Discontinued, Historical Med    omeprazole (PRILOSEC) 40 MG capsule Take one po daily, Normal    OXYGEN Inhale into the lungs. CPAP with oxygen at bedtime, Until Discontinued, Historical Med    raloxifene (EVISTA) 60 MG tablet Take 60 mg by mouth every morning. , Until Discontinued, Historical Med    sucralfate (CARAFATE) 1 GM/10ML suspension Take 10 mLs (1 g total) by mouth 2 (two) times daily., Starting 07/07/2012, Until Discontinued, Normal    SYMBICORT 160-4.5 MCG/ACT inhaler INHALE 2 PUFFS INTO LUNGS 2 TIMES DAILY, Normal    traMADol (ULTRAM) 50 MG tablet Take 50 mg by mouth every 6 (six) hours as needed for  moderate pain. Reported on 02/07/2015, Until Discontinued, Historical Med    Vitamin D, Ergocalciferol, (DRISDOL) 50000 UNITS CAPS capsule Take 50,000 Units by mouth every Monday. , Until Discontinued, Historical Med      STOP taking these medications     hydrALAZINE (APRESOLINE) 100 MG tablet      losartan (COZAAR) 100 MG tablet        Allergies  Allergen Reactions  . Promethazine Hcl Other (See Comments)    REACTION: lethargy  . Morphine And Related     Family request not to be given, reports pt does not wake up when given    Follow-up Information    Follow up with [facility].   Why:  HH PT and RN. They will call in the next 1-2 days to set up first home visit.   Contact information:   [address] [place] [address] [phone]        The results of significant diagnostics from this hospitalization (including imaging, microbiology, ancillary and laboratory) are listed below for reference.    Significant Diagnostic Studies: Dg Chest 2 View  05/20/2015  CLINICAL DATA:  Acute onset of generalized weakness, shortness of breath and headache. Cough and dyspnea. Vomiting. Initial encounter. EXAM: CHEST  2 VIEW COMPARISON:  Chest radiograph performed 09/22/2014 FINDINGS: The lungs are well-aerated. Mild vascular congestion is noted. Mildly increased interstitial markings may reflect minimal interstitial edema. There is no evidence of pleural effusion or pneumothorax. The heart is mildly enlarged. No acute osseous abnormalities are seen. IMPRESSION: Mild vascular congestion and mild cardiomegaly. Mildly increased interstitial markings may reflect minimal interstitial edema. Electronically Signed   By: Garald Balding M.D.   On: 05/20/2015 01:44   Ct Head Wo Contrast  05/20/2015  CLINICAL DATA:  Severe posterior headache. History of seizures and stroke, diabetes, hypertension, hyperlipidemia. EXAM: CT HEAD WITHOUT CONTRAST TECHNIQUE: Contiguous axial  images were obtained from the base of the skull through the vertex without intravenous contrast. COMPARISON:  CT head November 02, 2014 FINDINGS: INTRACRANIAL CONTENTS: The ventricles and sulci are normal for age. No intraparenchymal hemorrhage, mass effect nor midline shift. Patchy to come supratentorial white matter hypodensities are relatively unchanged. Old Small LEFT cerebellar infarct. No acute large vascular territory infarcts. No abnormal extra-axial fluid collections. Basal cisterns are patent. Mild calcific atherosclerosis of the carotid siphons. ORBITS: The included ocular globes and orbital contents are non-suspicious. SINUSES: The mastoid aircells and included paranasal sinuses are well-aerated. SKULL/SOFT TISSUES: No skull fracture. No  significant soft tissue swelling. Patient is edentulous. IMPRESSION: No acute intracranial process. 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 Electronically Signed   By: Elon Alas M.D.   On: 05/20/2015 01:51   US Abdomen Complete  05/23/2015  CLINICAL DATA:  Abdominal pain. EXAM: ABDOMEN ULTRASOUND COMPLETE COMPARISON:  09/15/2013. FINDINGS: Gallbladder: No gallstones noted. 6 mm echodensity noted the anterior gallbladder wall. This non mobile. This consistent polyp. Gallbladder wall thickness 2.1 mm. Negative Murphy sign. Common bile duct: Diameter: 4.6 mm Liver: No focal lesion identified. Within normal limits in parenchymal echogenicity. IVC: No abnormality visualized. Pancreas: Visualized portion unremarkable. Spleen: Size and appearance within normal limits. Right Kidney: Length: 10.2 cm. Echogenicity within normal limits. No solid mass or hydronephrosis visualized. 1.2 cm simple cyst. Left Kidney: Length: 10.6 cm. Echogenicity within normal limits. No solid mass or hydronephrosis visualized. 1.5 cm and 3.2 cm simple cyst. Abdominal aorta: No aneurysm visualized. Other findings: None. IMPRESSION: 1. 6  mm gallbladder polyp. 2.  Simple cysts both kidneys. Electronically Signed   By: Marcello Moores  Register   On: 05/23/2015 09:13   Dg Hip Unilat With Pelvis 2-3 Views Left  05/22/2015  CLINICAL DATA:  LEFT hip pain EXAM: DG HIP (WITH OR WITHOUT PELVIS) 2-3V LEFT COMPARISON:  None FINDINGS: Diffuse osseous demineralization. Degenerative disc and facet disease changes at visualized lower lumbar spine. Hip joints symmetric and preserved. No acute fracture, subluxation, or bone destruction. SI joints symmetric. IMPRESSION: No acute LEFT hip abnormalities. Degenerative changes at visualized lower lumbar spine. Electronically Signed   By: Lavonia Dana M.D.   On: 05/22/2015 17:29    Microbiology: Recent Results (from the past 240 hour(s))  MRSA PCR Screening     Status: None   Collection Time: 05/20/15  4:12 AM  Result Value Ref Range Status   MRSA by PCR NEGATIVE NEGATIVE Final    Comment:        The GeneXpert MRSA Assay (FDA approved for NASAL specimens only), is one component of a comprehensive MRSA colonization surveillance program. It is not intended to diagnose MRSA infection nor to guide or monitor treatment for MRSA infections.      Labs: Basic Metabolic Panel:  Recent Labs Lab 05/19/15 2358 05/20/15 0859 05/20/15 1651 05/22/15 0623 05/23/15 0707 05/24/15 0559  NA 142  --  142 142 144 143  K 3.3*  --  3.5 3.7 4.0 3.9  CL 103  --  106 106 110 111  CO2 27  --  27 26 26 23   GLUCOSE 227*  --  95 192* 122* 108*  BUN 55*  --  55* 69* 59* 43*  CREATININE 1.67*  --  2.98* 2.78* 1.98* 1.54*  CALCIUM 9.4  --  8.7* 8.4* 8.3* 8.2*  MG  --  2.2  --   --   --   --    Liver Function Tests: No results for input(s): AST, ALT, ALKPHOS, BILITOT, PROT, ALBUMIN in the last 168 hours. No results for input(s): LIPASE, AMYLASE in the last 168 hours. No results for input(s): AMMONIA in the last 168 hours. CBC:  Recent Labs Lab 05/19/15 1033 05/19/15 2358 05/20/15 1651  WBC  --  7.8 10.7*  HGB  11.2* 11.4* 10.1*  HCT  --  36.4 32.9*  MCV  --  72.1* 72.3*  PLT  --  240 210   Cardiac Enzymes:  Recent Labs Lab 05/19/15 0048 05/20/15 0434 05/20/15 0859 05/20/15 1651  TROPONINI  0.05* 0.08* 0.05* 0.04*   BNP: BNP (last 3 results)  Recent Labs  05/19/15 0048  BNP 119.4*    ProBNP (last 3 results) No results for input(s): PROBNP in the last 8760 hours.  CBG:  Recent Labs Lab 05/23/15 0817 05/23/15 1246 05/23/15 1634 05/23/15 2126 05/24/15 0806  GLUCAP 111* 164* 199* 185* 79       Signed:  Marquitta Persichetti MD.  [facility] 05/25/2015, 8:22 AM

## 2015-05-26 ENCOUNTER — Other Ambulatory Visit: Payer: Self-pay

## 2015-05-26 ENCOUNTER — Encounter (HOSPITAL_COMMUNITY): Payer: Medicare Other

## 2015-05-26 ENCOUNTER — Ambulatory Visit (HOSPITAL_COMMUNITY)
Admission: RE | Admit: 2015-05-26 | Discharge: 2015-05-26 | Disposition: A | Discharge status: Home / Self Care | Payer: Medicare Other | Source: Ambulatory Visit | Attending: Nephrology | Admitting: Nephrology

## 2015-05-26 LAB — RENAL FUNCTION PANEL
ANION GAP: 12 (ref 5–15)
Albumin: 2.8 g/dL — ABNORMAL LOW (ref 3.5–5.0)
BUN: 24 mg/dL — ABNORMAL HIGH (ref 6–20)
CALCIUM: 9.1 mg/dL (ref 8.9–10.3)
CO2: 25 mmol/L (ref 22–32)
CREATININE: 1.43 mg/dL — AB (ref 0.44–1.00)
Chloride: 108 mmol/L (ref 101–111)
GFR calc Af Amer: 42 mL/min — ABNORMAL LOW (ref >60)
GFR calc non Af Amer: 36 mL/min — ABNORMAL LOW (ref >60)
GLUCOSE: 100 mg/dL — AB (ref 65–99)
Phosphorus: 2.6 mg/dL (ref 2.5–4.6)
Potassium: 4.6 mmol/L (ref 3.5–5.1)
SODIUM: 145 mmol/L (ref 135–145)

## 2015-05-26 LAB — POCT HEMOGLOBIN-HEMACUE: Hemoglobin: 10.1 g/dL — ABNORMAL LOW (ref 12.0–15.0)

## 2015-05-26 MED ORDER — EPOETIN ALFA 20000 UNIT/ML IJ SOLN
INTRAMUSCULAR | Status: AC
  Filled 2015-05-26: qty 1

## 2015-05-26 MED ORDER — FERUMOXYTOL INJECTION 510 MG/17 ML
510.0000 mg | Freq: Once | INTRAVENOUS | Status: AC
  Administered 2015-05-26: 510 mg via INTRAVENOUS
  Filled 2015-05-26: qty 17

## 2015-05-26 MED ORDER — EPOETIN ALFA 20000 UNIT/ML IJ SOLN
20000.0000 [IU] | INTRAMUSCULAR | Status: DC
  Administered 2015-05-26: 20000 [IU] via SUBCUTANEOUS

## 2015-05-27 NOTE — Patient Outreach (Signed)
Patient was discharged from acute care on May 24, 2015 Successful telephone contact made with patient via telephone for initial transition of care call.  Patient identified herself by providing date of birth and address. Patient agreed to this telephone assessment.  Findings: Patient lives in single family home with her boyfriend, Earley Brooke who is her primary care giver. Patient has poorly controlled diabetes. patient leads a very sedentary lifestyle.  Plan: Home visit for heart failure education.

## 2015-05-29 ENCOUNTER — Other Ambulatory Visit: Payer: Self-pay

## 2015-05-29 DIAGNOSIS — E1142 Type 2 diabetes mellitus with diabetic polyneuropathy: Secondary | ICD-10-CM | POA: Diagnosis not present

## 2015-05-29 DIAGNOSIS — E1122 Type 2 diabetes mellitus with diabetic chronic kidney disease: Secondary | ICD-10-CM | POA: Diagnosis not present

## 2015-05-29 DIAGNOSIS — J45909 Unspecified asthma, uncomplicated: Secondary | ICD-10-CM | POA: Diagnosis not present

## 2015-05-29 DIAGNOSIS — Z8673 Personal history of transient ischemic attack (TIA), and cerebral infarction without residual deficits: Secondary | ICD-10-CM | POA: Diagnosis not present

## 2015-05-29 DIAGNOSIS — E1143 Type 2 diabetes mellitus with diabetic autonomic (poly)neuropathy: Secondary | ICD-10-CM | POA: Diagnosis not present

## 2015-05-29 DIAGNOSIS — I129 Hypertensive chronic kidney disease with stage 1 through stage 4 chronic kidney disease, or unspecified chronic kidney disease: Secondary | ICD-10-CM | POA: Diagnosis not present

## 2015-05-29 DIAGNOSIS — M150 Primary generalized (osteo)arthritis: Secondary | ICD-10-CM | POA: Diagnosis not present

## 2015-05-29 DIAGNOSIS — K219 Gastro-esophageal reflux disease without esophagitis: Secondary | ICD-10-CM | POA: Diagnosis not present

## 2015-05-29 DIAGNOSIS — Z794 Long term (current) use of insulin: Secondary | ICD-10-CM | POA: Diagnosis not present

## 2015-05-29 DIAGNOSIS — N183 Chronic kidney disease, stage 3 (moderate): Secondary | ICD-10-CM | POA: Diagnosis not present

## 2015-05-29 DIAGNOSIS — K5790 Diverticulosis of intestine, part unspecified, without perforation or abscess without bleeding: Secondary | ICD-10-CM | POA: Diagnosis not present

## 2015-05-29 DIAGNOSIS — F329 Major depressive disorder, single episode, unspecified: Secondary | ICD-10-CM | POA: Diagnosis not present

## 2015-05-29 DIAGNOSIS — I5032 Chronic diastolic (congestive) heart failure: Secondary | ICD-10-CM | POA: Diagnosis not present

## 2015-05-29 DIAGNOSIS — D649 Anemia, unspecified: Secondary | ICD-10-CM | POA: Diagnosis not present

## 2015-05-29 DIAGNOSIS — M810 Age-related osteoporosis without current pathological fracture: Secondary | ICD-10-CM | POA: Diagnosis not present

## 2015-05-29 DIAGNOSIS — E785 Hyperlipidemia, unspecified: Secondary | ICD-10-CM | POA: Diagnosis not present

## 2015-05-29 DIAGNOSIS — I2510 Atherosclerotic heart disease of native coronary artery without angina pectoris: Secondary | ICD-10-CM | POA: Diagnosis not present

## 2015-06-01 ENCOUNTER — Ambulatory Visit: Payer: Self-pay

## 2015-06-01 DIAGNOSIS — D638 Anemia in other chronic diseases classified elsewhere: Secondary | ICD-10-CM | POA: Diagnosis not present

## 2015-06-01 DIAGNOSIS — Z794 Long term (current) use of insulin: Secondary | ICD-10-CM | POA: Diagnosis not present

## 2015-06-01 DIAGNOSIS — E1165 Type 2 diabetes mellitus with hyperglycemia: Secondary | ICD-10-CM | POA: Diagnosis not present

## 2015-06-01 DIAGNOSIS — I10 Essential (primary) hypertension: Secondary | ICD-10-CM | POA: Diagnosis not present

## 2015-06-01 DIAGNOSIS — E1122 Type 2 diabetes mellitus with diabetic chronic kidney disease: Secondary | ICD-10-CM | POA: Diagnosis not present

## 2015-06-01 DIAGNOSIS — I2510 Atherosclerotic heart disease of native coronary artery without angina pectoris: Secondary | ICD-10-CM | POA: Diagnosis not present

## 2015-06-01 DIAGNOSIS — E1143 Type 2 diabetes mellitus with diabetic autonomic (poly)neuropathy: Secondary | ICD-10-CM | POA: Diagnosis not present

## 2015-06-01 DIAGNOSIS — E7800 Pure hypercholesterolemia, unspecified: Secondary | ICD-10-CM | POA: Diagnosis not present

## 2015-06-01 DIAGNOSIS — I5032 Chronic diastolic (congestive) heart failure: Secondary | ICD-10-CM | POA: Diagnosis not present

## 2015-06-01 DIAGNOSIS — I129 Hypertensive chronic kidney disease with stage 1 through stage 4 chronic kidney disease, or unspecified chronic kidney disease: Secondary | ICD-10-CM | POA: Diagnosis not present

## 2015-06-01 DIAGNOSIS — E1121 Type 2 diabetes mellitus with diabetic nephropathy: Secondary | ICD-10-CM | POA: Diagnosis not present

## 2015-06-01 DIAGNOSIS — N183 Chronic kidney disease, stage 3 (moderate): Secondary | ICD-10-CM | POA: Diagnosis not present

## 2015-06-01 DIAGNOSIS — E1322 Other specified diabetes mellitus with diabetic chronic kidney disease: Secondary | ICD-10-CM | POA: Diagnosis not present

## 2015-06-02 ENCOUNTER — Encounter (HOSPITAL_COMMUNITY)
Admission: RE | Admit: 2015-06-02 | Discharge: 2015-06-02 | Disposition: A | Discharge status: Home / Self Care | Payer: Medicare Other | Source: Ambulatory Visit | Attending: Nephrology | Admitting: Nephrology

## 2015-06-02 DIAGNOSIS — I129 Hypertensive chronic kidney disease with stage 1 through stage 4 chronic kidney disease, or unspecified chronic kidney disease: Secondary | ICD-10-CM | POA: Diagnosis not present

## 2015-06-02 DIAGNOSIS — D631 Anemia in chronic kidney disease: Secondary | ICD-10-CM | POA: Insufficient documentation

## 2015-06-02 DIAGNOSIS — I2510 Atherosclerotic heart disease of native coronary artery without angina pectoris: Secondary | ICD-10-CM | POA: Diagnosis not present

## 2015-06-02 DIAGNOSIS — N183 Chronic kidney disease, stage 3 (moderate): Secondary | ICD-10-CM | POA: Diagnosis not present

## 2015-06-02 DIAGNOSIS — E1143 Type 2 diabetes mellitus with diabetic autonomic (poly)neuropathy: Secondary | ICD-10-CM | POA: Diagnosis not present

## 2015-06-02 DIAGNOSIS — E1122 Type 2 diabetes mellitus with diabetic chronic kidney disease: Secondary | ICD-10-CM | POA: Diagnosis not present

## 2015-06-02 DIAGNOSIS — I5032 Chronic diastolic (congestive) heart failure: Secondary | ICD-10-CM | POA: Diagnosis not present

## 2015-06-02 LAB — POCT HEMOGLOBIN-HEMACUE: Hemoglobin: 9.6 g/dL — ABNORMAL LOW (ref 12.0–15.0)

## 2015-06-02 MED ORDER — EPOETIN ALFA 20000 UNIT/ML IJ SOLN
20000.0000 [IU] | INTRAMUSCULAR | Status: DC
  Administered 2015-06-02: 20000 [IU] via SUBCUTANEOUS

## 2015-06-02 MED ORDER — EPOETIN ALFA 20000 UNIT/ML IJ SOLN
INTRAMUSCULAR | Status: AC
  Filled 2015-06-02: qty 1

## 2015-06-05 ENCOUNTER — Other Ambulatory Visit: Payer: Self-pay

## 2015-06-05 DIAGNOSIS — I2510 Atherosclerotic heart disease of native coronary artery without angina pectoris: Secondary | ICD-10-CM | POA: Diagnosis not present

## 2015-06-05 DIAGNOSIS — E1143 Type 2 diabetes mellitus with diabetic autonomic (poly)neuropathy: Secondary | ICD-10-CM | POA: Diagnosis not present

## 2015-06-05 DIAGNOSIS — I5032 Chronic diastolic (congestive) heart failure: Secondary | ICD-10-CM | POA: Diagnosis not present

## 2015-06-05 DIAGNOSIS — N183 Chronic kidney disease, stage 3 (moderate): Secondary | ICD-10-CM | POA: Diagnosis not present

## 2015-06-05 DIAGNOSIS — I129 Hypertensive chronic kidney disease with stage 1 through stage 4 chronic kidney disease, or unspecified chronic kidney disease: Secondary | ICD-10-CM | POA: Diagnosis not present

## 2015-06-05 DIAGNOSIS — E1122 Type 2 diabetes mellitus with diabetic chronic kidney disease: Secondary | ICD-10-CM | POA: Diagnosis not present

## 2015-06-05 NOTE — Patient Outreach (Signed)
Joann Gutierrez) Care Management  06/05/2015  JACKEE GLASNER 03-23-1945 [id number]    Initial home visit for community care coordination. Patient, her caregiver Mr. [name] and this RNCM met to assess her chronic disease education needs and need for community resources. Patient states she was having some stomach discomfort, however, the discomfort did not rise to level of needing a visit to the doctor. Patient consented to Mr. [name] being present during the home visit. Mr. [name] added information patient sometimes had trouble recalling.    The following were findings during this home visit: Patient stated she had packed away the [place] with information regarding HF Action Plan Zone and other information.  She stated the EMMI paperwork presented to her during a previous encounter was in the pile of papers behind the couch  Sedentary lifestyle-Patient and caregiver admits she leads a sedentary lifestyle Not consistently weighing - both patient and caregiver admits she does not weight daily, however, patient does have a workable scale.    Patient states she does not remember the information presented previously on Heart Failure. Patient and RNCM discussed PACE, benefits of PACE.  Patient agreed to referral for consult.   Plan: Patient, caregiver and RNCM agreed on home visit for Heart Failure Education, Referral to PACE for consult.

## 2015-06-06 ENCOUNTER — Ambulatory Visit: Payer: Medicare Other

## 2015-06-06 DIAGNOSIS — E1143 Type 2 diabetes mellitus with diabetic autonomic (poly)neuropathy: Secondary | ICD-10-CM | POA: Diagnosis not present

## 2015-06-06 DIAGNOSIS — N183 Chronic kidney disease, stage 3 (moderate): Secondary | ICD-10-CM | POA: Diagnosis not present

## 2015-06-06 DIAGNOSIS — I5032 Chronic diastolic (congestive) heart failure: Secondary | ICD-10-CM | POA: Diagnosis not present

## 2015-06-06 DIAGNOSIS — E1122 Type 2 diabetes mellitus with diabetic chronic kidney disease: Secondary | ICD-10-CM | POA: Diagnosis not present

## 2015-06-06 DIAGNOSIS — I2510 Atherosclerotic heart disease of native coronary artery without angina pectoris: Secondary | ICD-10-CM | POA: Diagnosis not present

## 2015-06-06 DIAGNOSIS — I129 Hypertensive chronic kidney disease with stage 1 through stage 4 chronic kidney disease, or unspecified chronic kidney disease: Secondary | ICD-10-CM | POA: Diagnosis not present

## 2015-06-08 DIAGNOSIS — N183 Chronic kidney disease, stage 3 (moderate): Secondary | ICD-10-CM | POA: Diagnosis not present

## 2015-06-08 DIAGNOSIS — E1143 Type 2 diabetes mellitus with diabetic autonomic (poly)neuropathy: Secondary | ICD-10-CM | POA: Diagnosis not present

## 2015-06-08 DIAGNOSIS — I129 Hypertensive chronic kidney disease with stage 1 through stage 4 chronic kidney disease, or unspecified chronic kidney disease: Secondary | ICD-10-CM | POA: Diagnosis not present

## 2015-06-08 DIAGNOSIS — E1122 Type 2 diabetes mellitus with diabetic chronic kidney disease: Secondary | ICD-10-CM | POA: Diagnosis not present

## 2015-06-08 DIAGNOSIS — I2510 Atherosclerotic heart disease of native coronary artery without angina pectoris: Secondary | ICD-10-CM | POA: Diagnosis not present

## 2015-06-08 DIAGNOSIS — I5032 Chronic diastolic (congestive) heart failure: Secondary | ICD-10-CM | POA: Diagnosis not present

## 2015-06-09 ENCOUNTER — Encounter (HOSPITAL_COMMUNITY)
Admission: RE | Admit: 2015-06-09 | Discharge: 2015-06-09 | Disposition: A | Discharge status: Home / Self Care | Payer: Medicare Other | Source: Ambulatory Visit | Attending: Nephrology | Admitting: Nephrology

## 2015-06-09 DIAGNOSIS — D631 Anemia in chronic kidney disease: Secondary | ICD-10-CM | POA: Diagnosis not present

## 2015-06-09 DIAGNOSIS — N183 Chronic kidney disease, stage 3 (moderate): Secondary | ICD-10-CM | POA: Diagnosis not present

## 2015-06-09 LAB — IRON AND TIBC
IRON: 40 ug/dL (ref 28–170)
SATURATION RATIOS: 18 % (ref 10.4–31.8)
TIBC: 218 ug/dL — AB (ref 250–450)
UIBC: 178 ug/dL

## 2015-06-09 LAB — FERRITIN: Ferritin: 379 ng/mL — ABNORMAL HIGH (ref 11–307)

## 2015-06-09 MED ORDER — EPOETIN ALFA 20000 UNIT/ML IJ SOLN
20000.0000 [IU] | INTRAMUSCULAR | Status: DC
  Administered 2015-06-09: 20000 [IU] via SUBCUTANEOUS

## 2015-06-09 MED ORDER — EPOETIN ALFA 20000 UNIT/ML IJ SOLN
INTRAMUSCULAR | Status: AC
  Filled 2015-06-09: qty 1

## 2015-06-10 LAB — PTH, INTACT AND CALCIUM
Calcium, Total (PTH): 8.7 mg/dL (ref 8.7–10.3)
PTH: 128 pg/mL — ABNORMAL HIGH (ref 15–65)

## 2015-06-12 ENCOUNTER — Encounter: Payer: Self-pay | Admitting: *Deleted

## 2015-06-12 ENCOUNTER — Other Ambulatory Visit: Payer: Self-pay

## 2015-06-12 ENCOUNTER — Ambulatory Visit: Payer: Self-pay

## 2015-06-12 LAB — POCT HEMOGLOBIN-HEMACUE: HEMOGLOBIN: 11.2 g/dL — AB (ref 12.0–15.0)

## 2015-06-13 DIAGNOSIS — N183 Chronic kidney disease, stage 3 (moderate): Secondary | ICD-10-CM | POA: Diagnosis not present

## 2015-06-13 DIAGNOSIS — E1122 Type 2 diabetes mellitus with diabetic chronic kidney disease: Secondary | ICD-10-CM | POA: Diagnosis not present

## 2015-06-13 DIAGNOSIS — I2510 Atherosclerotic heart disease of native coronary artery without angina pectoris: Secondary | ICD-10-CM | POA: Diagnosis not present

## 2015-06-13 DIAGNOSIS — I129 Hypertensive chronic kidney disease with stage 1 through stage 4 chronic kidney disease, or unspecified chronic kidney disease: Secondary | ICD-10-CM | POA: Diagnosis not present

## 2015-06-13 DIAGNOSIS — E1143 Type 2 diabetes mellitus with diabetic autonomic (poly)neuropathy: Secondary | ICD-10-CM | POA: Diagnosis not present

## 2015-06-13 DIAGNOSIS — I5032 Chronic diastolic (congestive) heart failure: Secondary | ICD-10-CM | POA: Diagnosis not present

## 2015-06-13 NOTE — Patient Outreach (Signed)
[facility]) Care Management  06/13/2015  KALI ARITA 11-Mar-1945 [id number]   Initial home visit for chronic disease education. Patient's significant other, Mr. [name], was present and patient consented to allow him to be present during this home visit.  Patient and RNCM reviewed [place]. Patient denies need for further explanation of packet or explanation of needs.  Patient and this RNCM viewed 2 EMMI Videos on CKD and Heart FAilure. Patient states she understands Heart Failure a little better.  Patient and this RNCM collaborated to create her case management care plan.  Plan: Home visit next month for further education.   Telephone contact next week for transition of care.

## 2015-06-14 ENCOUNTER — Ambulatory Visit (INDEPENDENT_AMBULATORY_CARE_PROVIDER_SITE_OTHER): Payer: Medicare Other | Admitting: Gastroenterology

## 2015-06-14 ENCOUNTER — Encounter: Payer: Self-pay | Admitting: Gastroenterology

## 2015-06-14 VITALS — BP 124/60 | HR 84 | Ht 64.0 in | Wt 282.0 lb

## 2015-06-14 DIAGNOSIS — R1310 Dysphagia, unspecified: Secondary | ICD-10-CM | POA: Diagnosis not present

## 2015-06-14 DIAGNOSIS — Z86010 Personal history of colonic polyps: Secondary | ICD-10-CM

## 2015-06-14 DIAGNOSIS — K3184 Gastroparesis: Secondary | ICD-10-CM | POA: Diagnosis not present

## 2015-06-14 MED ORDER — NA SULFATE-K SULFATE-MG SULF 17.5-3.13-1.6 GM/177ML PO SOLN: ORAL | Status: DC

## 2015-06-14 NOTE — Progress Notes (Signed)
HPI :  70 y/o female, former patient of Dr. Deatra Ina, followed for symptoms of gastroparesis and dysphagia. She has a history of oxygen dependant COPD, CHF, and a history of CVA on aspirin.   She was last seen by PA Zehr. Since that visit she has had an esophageal manometry to evaluate her dysphagia. It showed normal motility on manometry, with the finding of a hypertensive LES, which relaxes normally and no evidence of achalasia.  She has had been having dysphagia which has persisted since that time. She reports dysphagia with most meals. She has dysphagia in the sternal notch. All types of solids can cause symptoms. She has some dysphagia to liquids as well. She last had a dilation in 2015 and it provided benefit (52 Fr dilation empirically). Over time the symptoms have slowly recurred. She has occasional heartburn. She takes omeprazole 40mg  which works fairly well for her to keep symptoms fairly well controlled. She has a history of gastroparesis for which she takes Reglan, she takes it before most meals. She thnks it helps her gastroparesis symptoms. She does not think she has any sife effect from it but states she has been on it for a long time. She denies any vomiting. She is eating okay, she denies early satiety.   GES in 2012 was normal, previously in 2009 was slightly delayed. She reports her symptoms of gastroparesis historically have been mild.   She otherwise has a history of a CVA, she thinks reports a long time ago, no recent events. She also has a history of CHF. She has chronic dyspnea on exertion.   She denies any blood in the stools. She denies constipation or diarrhea. Brother had colon cancer, around age 61s. Last colonoscopy 11/2010 - 2 adenomas removed, told to follow up in 5 years.     Past Medical History  Diagnosis Date  . Allergy   . Depression   . Diabetes mellitus 1997    Type II   . GERD (gastroesophageal reflux disease)   . Hypertension   . Hyperlipidemia   .  Osteoarthritis   . Osteoporosis   . Hair loss   . Anemia   . CAD (coronary artery disease)     Mild very minimal coronary disease with 20% obtuse marginal stenosis  . CVA (cerebral infarction)   . Diverticulosis   . Esophageal stricture   . Gastritis   . Adenomatous colon polyp   . Sickle cell trait ([place])   . Asthma        . COPD (chronic obstructive pulmonary disease) ([place])   . Secondary pulmonary hypertension ([place]) 03/07/2009  . Gastroparesis 08/21/2007  . RENAL INSUFFICIENCY 02/16/2009  . OSTEOARTHRITIS 08/09/2006  . Morbid obesity ([place])   . CHF (congestive heart failure) ([place])   . Carpal tunnel syndrome on left   . 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 Hernia, hiatal   . Seizures ([place])     pt thinks it has been several monthes since she had a seisure  . Stroke ([place])   . Hearing loss of both ears   . 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 Chronic kidney disease (CKD), stage III (moderate)   . Tubular adenoma of colon   . Esophageal dysmotility      Past Surgical History  Procedure Laterality Date  . Abdominal hysterectomy    . Replacement total knee Left 1998  . Esophagogastroduodenoscopy  06/25/2006  . Erd  08/08/2000  . Artery biopsy  01/07/2011    Procedure: MINOR BIOPSY TEMPORAL ARTERY;  Surgeon: Haywood Lasso, MD;  Location: [facility];  Service: General;  Laterality: Left;  left temporal artery biopsy  . Breast lumpectomy      benign  . Breast lumpectomy      both breast lumps removed   . Hernia repair    . Colonoscopy    . Upper gastrointestinal endoscopy    . Bil foot surgery    . Left and right heart catheterization with coronary angiogram N/A 03/03/2013    Procedure: LEFT AND RIGHT HEART CATHETERIZATION WITH CORONARY ANGIOGRAM;  Surgeon: Minus Breeding, MD;  Location: [facility] CATH LAB;  Service: Cardiovascular;  Laterality: N/A;  . Esophageal manometry N/A 03/13/2015    Procedure: ESOPHAGEAL MANOMETRY (EM);  Surgeon: Manus Gunning, MD;  Location: [facility] ENDOSCOPY;  Service: Gastroenterology;  Laterality: N/A;   Family History  Problem Relation Age of Onset  . Colon cancer Brother   . Cancer Brother     Colon Cancer  . Liver cancer Mother     Liver Cancer  . Diabetes Mother   . Kidney disease Mother   . Heart disease Mother     age 25's  . Heart disease Father 12    MI  . Heart attack Father     died of MI when pt was 52  . Sickle cell anemia Father   . Diabetes Sister   . Kidney disease Sister   . Heart disease Sister     age 21's  . Allergies Sister   . Diabetes Sister   . Kidney disease Sister   . Heart disease Sister     age 68's  . Esophageal cancer Neg Hx   . Rectal cancer Neg Hx   . Stomach cancer Neg Hx    Social History  Substance Use Topics  . Smoking status: Former Smoker -- 0.50 packs/day for 10 years    Quit date: 04/18/1980  . Smokeless tobacco: Never Used  . Alcohol Use: No   Current Outpatient Prescriptions  Medication Sig Dispense Refill  . albuterol (VENTOLIN HFA) 108 (90 BASE) MCG/ACT inhaler Inhale 2 puffs into the lungs every 6 (six) hours as needed for shortness of breath. 1 Inhaler 11  . aspirin 325 MG tablet Take 325 mg by mouth daily.     Marland Kitchen atorvastatin (LIPITOR) 40 MG tablet Take 1 tablet (40 mg total) by mouth daily. (Patient taking differently: Take 40 mg by mouth at bedtime. ) 30 tablet 6  . Calcium Carbonate-Vit D-Min (CALTRATE PLUS PO) Take 600 mg by mouth every morning.     Marland Kitchen Cod Liver Oil CAPS Take 1 capsule by mouth daily.    . cyclobenzaprine (FLEXERIL) 10 MG tablet Take 10 mg by mouth at bedtime. Reported on 02/07/2015    . dextromethorphan-guaiFENesin (ROBITUSSIN-DM) 10-100 MG/5ML liquid Take 10 mLs by mouth every 4 (four) hours as needed for cough. Reported on 01/25/2015    . donepezil (ARICEPT) 10 MG tablet Take 10 mg by mouth at bedtime.     Marland Kitchen doxazosin (CARDURA) 2 MG tablet Take 1 tablet by mouth at bedtime.    Marland Kitchen ezetimibe (ZETIA) 10 MG tablet Take 10  mg by mouth at bedtime.     . ferrous sulfate 325 (65 FE) MG EC tablet Take 325 mg by mouth every morning. Reported on 01/25/2015    . fluticasone (FLONASE) 50 MCG/ACT nasal spray Place 2 sprays into both nostrils daily. 16 g  11  . furosemide (LASIX) 80 MG tablet Take 1 tablet (80 mg total) by mouth 2 (two) times daily.    Marland Kitchen gabapentin (NEURONTIN) 300 MG capsule Take 1 capsule (300 mg total) by mouth 2 (two) times daily. 60 capsule 5  . insulin glargine (LANTUS) 100 UNIT/ML injection Inject 0.15 mLs (15 Units total) into the skin daily. (Patient taking differently: Inject 30 Units into the skin daily. ) 10 mL 11  . insulin NPH (HUMULIN N) 100 UNIT/ML injection Inject 2-13 Units into the skin 3 (three) times daily after meals. Reported on 01/25/2015    . isosorbide mononitrate (IMDUR) 120 MG 24 hr tablet Take 1 tablet (120 mg total) by mouth daily. PATIENT NEEDS TO CONTACT OFFICE FOR ADDITIONAL REFILLS 15 tablet 0  . KLOR-CON M20 20 MEQ tablet Take 1 tablet by mouth daily.     Marland Kitchen LIDODERM 5 % Place 1 patch onto the skin daily as needed (pain). Reported on 02/07/2015    . metoCLOPramide (REGLAN) 10 MG tablet TAKE ONE TABLET BY MOUTH BEFORE MEALS AND AT BEDTIME 120 tablet 3  . metolazone (ZAROXOLYN) 2.5 MG tablet Take 1 tablet (2.5 mg total) by mouth 3 (three) times a week. Take 1 tablet by mouth on Monday, Wednesday and Friday    . montelukast (SINGULAIR) 10 MG tablet Take 1 tablet (10 mg total) by mouth daily. 30 tablet 11  . Multiple Vitamins-Minerals (MULTIVITAMIN GUMMIES ADULT) CHEW Chew 1 capsule by mouth daily.    . nitroGLYCERIN (NITROSTAT) 0.4 MG SL tablet PLACE ONE TABLET UNDER THE TONGUE EVERY FIVE MINUTES AS NEEDED FOR CHEST PAIN 25 tablet 4  . Omega-3 Fatty Acids (FISH OIL) 1000 MG CAPS Take 1 capsule by mouth daily.    Marland Kitchen omeprazole (PRILOSEC) 40 MG capsule Take one po daily 90 capsule 3  . OXYGEN Inhale into the lungs. CPAP with oxygen at bedtime    . raloxifene (EVISTA) 60 MG tablet Take 60  mg by mouth every morning.     . sucralfate (CARAFATE) 1 GM/10ML suspension Take 10 mLs (1 g total) by mouth 2 (two) times daily. 1260 mL 3  . SYMBICORT 160-4.5 MCG/ACT inhaler INHALE 2 PUFFS INTO LUNGS 2 TIMES DAILY 10.2 g 5  . traMADol (ULTRAM) 50 MG tablet Take 50 mg by mouth every 6 (six) hours as needed for moderate pain. Reported on 02/07/2015    . Vitamin D, Ergocalciferol, (DRISDOL) 50000 UNITS CAPS capsule Take 50,000 Units by mouth every Monday.      No current facility-administered medications for this visit.   Allergies  Allergen Reactions  . Promethazine Hcl Other (See Comments)    REACTION: lethargy  . Morphine And Related     Family request not to be given, reports pt does not wake up when given      Review of Systems: All systems reviewed and negative except where noted in HPI.   Lab Results  Component Value Date   WBC 10.7* 05/20/2015   HGB 11.2* 06/09/2015   HCT 32.9* 05/20/2015   MCV 72.3* 05/20/2015   PLT 210 05/20/2015    Lab Results  Component Value Date   IRON 40 06/09/2015   TIBC 218* 06/09/2015   FERRITIN 379* 06/09/2015   Lab Results  Component Value Date   CREATININE 1.43* 05/26/2015   BUN 24* 05/26/2015   NA 145 05/26/2015   K 4.6 05/26/2015   CL 108 05/26/2015   CO2 25 05/26/2015     Physical Exam: BP 124/60  mmHg  Pulse 84  Ht 5\' 4"  (1.626 m)  Wt 282 lb (127.914 kg)  BMI 48.38 kg/m2 Constitutional: Pleasant,well-developed, female in no acute distress. HEENT: Normocephalic and atraumatic. Conjunctivae are normal. No scleral icterus. Neck supple.  Cardiovascular: Normal rate, regular rhythm.  Pulmonary/chest: Effort normal and breath sounds normal. No wheezing, rales or rhonchi. Abdominal: Soft, nondistended, protuberant, nontender. Bowel sounds active throughout. There are no masses palpable. No hepatomegaly. Extremities: (+) 1 LE edema Lymphadenopathy: No cervical adenopathy noted. Neurological: Alert and oriented to person place  and time. Skin: Skin is warm and dry. No rashes noted. Psychiatric: Normal mood and affect. Behavior is normal.   ASSESSMENT AND PLAN: 70 y/o female with oxygen dependant COPD, CHF, history of CVA, presenting with the following issues:  Dysphagia - longstanding as outlined above to both solids and liquids. Prior EGD in 2015 did not show any significant stenosis however she endorsed significant improvement in symptoms. Manometry showed hypertensive LES with normal relaxation and otherwise normal motility, no evidence of achalasia. Given her manometry finding, I recommend an EGD to evaluate the LES, ensure normal, and given her prior response to empiric dilation we can perform this again. Perhaps, given Venia Minks was used, she had a subtle UES stricture although this was not noted on prior barium study. I discussed risks / benefits of EGD and sedation with her. Given her need for supplemental oxygen and other medical problems, her case will be done with anesthesia support at the hospital. She will decrease aspirin to 81mg  for 5-7 days prior to the procedure given risks of bleeding with dilation.   H/o gastroparesis - she reports on longstanding Reglan although unclear how long this has been. Discussed with her that this is FDA approved for 12 weeks only. She is tolerating it well without significant side effects today, and doesn't have much symptoms for gastroparesis at present time. Would recommend decreasing her dose from QID to BID for a few weeks, and then daily, and then stop and see how she does as her prior gastric emptying study in 2012 was normal. If she has significant recurrence of symptoms she can use it again at the lowest dose needed to control symptoms, or can consider a trial of Domperidone. She agreed.   History of colon adenoma / FH of colon cancer - due for surveillance colonoscopy this year, given we are sedating her for EGD, will proceed to perform her colonoscopy at the same. Discussed  risks / benefits of the procedure and she wishes to proceed.   [place] Cellar, MD [facility]astroenterology Pager [phone]

## 2015-06-14 NOTE — Patient Instructions (Signed)
You have been scheduled for an endoscopy and colonoscopy. Please follow the written instructions given to you at your visit today. Please pick up your prep supplies at the pharmacy within the next 1-3 days. If you use inhalers (even only as needed), please bring them with you on the day of your procedure. Your physician has requested that you go to www.startemmi.com and enter the access code given to you at your visit today. This web site gives a general overview about your procedure. However, you should still follow specific instructions given to you by our office regarding your preparation for the procedure.  Please make sure you change from Aspirin 325 mg daily to Aspirin 81 mg daily 7 days prior to your procedure.  Decrease your Reglan to 1-2 times daily and continue to decrease usage until completely off of the medication.  If you are age 70 or older, your body mass index should be between 23-30. Your Body mass index is 48.38 kg/(m^2). If this is out of the aforementioned range listed, please consider follow up with your Primary Care Provider.  If you are age 46 or younger, your body mass index should be between 19-25. Your Body mass index is 48.38 kg/(m^2). If this is out of the aformentioned range listed, please consider follow up with your Primary Care Provider.

## 2015-06-15 ENCOUNTER — Other Ambulatory Visit (HOSPITAL_COMMUNITY): Payer: Self-pay | Admitting: *Deleted

## 2015-06-15 DIAGNOSIS — E1143 Type 2 diabetes mellitus with diabetic autonomic (poly)neuropathy: Secondary | ICD-10-CM | POA: Diagnosis not present

## 2015-06-15 DIAGNOSIS — I129 Hypertensive chronic kidney disease with stage 1 through stage 4 chronic kidney disease, or unspecified chronic kidney disease: Secondary | ICD-10-CM | POA: Diagnosis not present

## 2015-06-15 DIAGNOSIS — I2510 Atherosclerotic heart disease of native coronary artery without angina pectoris: Secondary | ICD-10-CM | POA: Diagnosis not present

## 2015-06-15 DIAGNOSIS — I5032 Chronic diastolic (congestive) heart failure: Secondary | ICD-10-CM | POA: Diagnosis not present

## 2015-06-15 DIAGNOSIS — E1122 Type 2 diabetes mellitus with diabetic chronic kidney disease: Secondary | ICD-10-CM | POA: Diagnosis not present

## 2015-06-15 DIAGNOSIS — N183 Chronic kidney disease, stage 3 (moderate): Secondary | ICD-10-CM | POA: Diagnosis not present

## 2015-06-16 ENCOUNTER — Encounter (HOSPITAL_COMMUNITY)
Admission: RE | Admit: 2015-06-16 | Discharge: 2015-06-16 | Disposition: A | Discharge status: Home / Self Care | Payer: Medicare Other | Source: Ambulatory Visit | Attending: Nephrology | Admitting: Nephrology

## 2015-06-16 DIAGNOSIS — E1143 Type 2 diabetes mellitus with diabetic autonomic (poly)neuropathy: Secondary | ICD-10-CM | POA: Diagnosis not present

## 2015-06-16 DIAGNOSIS — I5032 Chronic diastolic (congestive) heart failure: Secondary | ICD-10-CM | POA: Diagnosis not present

## 2015-06-16 DIAGNOSIS — E1122 Type 2 diabetes mellitus with diabetic chronic kidney disease: Secondary | ICD-10-CM | POA: Diagnosis not present

## 2015-06-16 DIAGNOSIS — N183 Chronic kidney disease, stage 3 (moderate): Secondary | ICD-10-CM | POA: Diagnosis not present

## 2015-06-16 DIAGNOSIS — I129 Hypertensive chronic kidney disease with stage 1 through stage 4 chronic kidney disease, or unspecified chronic kidney disease: Secondary | ICD-10-CM | POA: Diagnosis not present

## 2015-06-16 DIAGNOSIS — D631 Anemia in chronic kidney disease: Secondary | ICD-10-CM | POA: Diagnosis not present

## 2015-06-16 DIAGNOSIS — I2510 Atherosclerotic heart disease of native coronary artery without angina pectoris: Secondary | ICD-10-CM | POA: Diagnosis not present

## 2015-06-16 LAB — POCT HEMOGLOBIN-HEMACUE: Hemoglobin: 10.6 g/dL — ABNORMAL LOW (ref 12.0–15.0)

## 2015-06-16 MED ORDER — EPOETIN ALFA 20000 UNIT/ML IJ SOLN
20000.0000 [IU] | INTRAMUSCULAR | Status: DC
  Administered 2015-06-16: 20000 [IU] via SUBCUTANEOUS

## 2015-06-16 MED ORDER — EPOETIN ALFA 20000 UNIT/ML IJ SOLN
INTRAMUSCULAR | Status: AC
  Filled 2015-06-16: qty 1

## 2015-06-16 MED ORDER — SODIUM CHLORIDE 0.9 % IV SOLN
510.0000 mg | INTRAVENOUS | Status: DC
  Administered 2015-06-16: 510 mg via INTRAVENOUS
  Filled 2015-06-16: qty 17

## 2015-06-20 DIAGNOSIS — N183 Chronic kidney disease, stage 3 (moderate): Secondary | ICD-10-CM | POA: Diagnosis not present

## 2015-06-20 DIAGNOSIS — I129 Hypertensive chronic kidney disease with stage 1 through stage 4 chronic kidney disease, or unspecified chronic kidney disease: Secondary | ICD-10-CM | POA: Diagnosis not present

## 2015-06-20 DIAGNOSIS — E1122 Type 2 diabetes mellitus with diabetic chronic kidney disease: Secondary | ICD-10-CM | POA: Diagnosis not present

## 2015-06-20 DIAGNOSIS — I2510 Atherosclerotic heart disease of native coronary artery without angina pectoris: Secondary | ICD-10-CM | POA: Diagnosis not present

## 2015-06-20 DIAGNOSIS — I5032 Chronic diastolic (congestive) heart failure: Secondary | ICD-10-CM | POA: Diagnosis not present

## 2015-06-20 DIAGNOSIS — E1143 Type 2 diabetes mellitus with diabetic autonomic (poly)neuropathy: Secondary | ICD-10-CM | POA: Diagnosis not present

## 2015-06-21 ENCOUNTER — Encounter (HOSPITAL_COMMUNITY): Payer: Self-pay | Admitting: *Deleted

## 2015-06-22 ENCOUNTER — Other Ambulatory Visit: Payer: Self-pay

## 2015-06-22 DIAGNOSIS — E1143 Type 2 diabetes mellitus with diabetic autonomic (poly)neuropathy: Secondary | ICD-10-CM | POA: Diagnosis not present

## 2015-06-22 DIAGNOSIS — E1122 Type 2 diabetes mellitus with diabetic chronic kidney disease: Secondary | ICD-10-CM | POA: Diagnosis not present

## 2015-06-22 DIAGNOSIS — I2510 Atherosclerotic heart disease of native coronary artery without angina pectoris: Secondary | ICD-10-CM | POA: Diagnosis not present

## 2015-06-22 DIAGNOSIS — I5032 Chronic diastolic (congestive) heart failure: Secondary | ICD-10-CM | POA: Diagnosis not present

## 2015-06-22 DIAGNOSIS — N183 Chronic kidney disease, stage 3 (moderate): Secondary | ICD-10-CM | POA: Diagnosis not present

## 2015-06-22 DIAGNOSIS — I129 Hypertensive chronic kidney disease with stage 1 through stage 4 chronic kidney disease, or unspecified chronic kidney disease: Secondary | ICD-10-CM | POA: Diagnosis not present

## 2015-06-22 NOTE — Patient Outreach (Signed)
  Telephone call made to patient to follow up with progression made towards reaching case management care plan goals.  The following was reported by patient:  Noncompliance with low sodium diet, Breakfast on 5/31 2 boiled eggs, and a hot pocket Lunch on 5/31 a liver pudding sandwich with sliced tomatoes Dinner on 5/31 Oodles of Noodles  Inconsistent daily weights, however, patient states she is compliant with the medication regimen prescribed. Patient also endorses sedentary lifestyle in that she gets up about 11 am, watch television all day until bedtime.  Patient states she goes out sometimes when her boyfriend, Mr. [name], takes her out.    Patient encouraged to avoid high sodium food consumption.  Examples of low food choices given to patient which include yogurt, fresh fruits and vegetables and/or frozen fresh food and vegetables. Patient's case management care plan reviewed and updated accordingly.   Plan: Home visit next week for low sodium food education.

## 2015-06-23 ENCOUNTER — Ambulatory Visit (HOSPITAL_COMMUNITY)
Admission: RE | Admit: 2015-06-23 | Discharge: 2015-06-23 | Disposition: A | Discharge status: Home / Self Care | Payer: Medicare Other | Source: Ambulatory Visit | Attending: Nephrology | Admitting: Nephrology

## 2015-06-23 DIAGNOSIS — N183 Chronic kidney disease, stage 3 (moderate): Secondary | ICD-10-CM | POA: Diagnosis not present

## 2015-06-23 DIAGNOSIS — Z5181 Encounter for therapeutic drug level monitoring: Secondary | ICD-10-CM | POA: Diagnosis not present

## 2015-06-23 DIAGNOSIS — D631 Anemia in chronic kidney disease: Secondary | ICD-10-CM | POA: Diagnosis not present

## 2015-06-23 DIAGNOSIS — Z79899 Other long term (current) drug therapy: Secondary | ICD-10-CM | POA: Diagnosis not present

## 2015-06-23 DIAGNOSIS — D509 Iron deficiency anemia, unspecified: Secondary | ICD-10-CM | POA: Diagnosis not present

## 2015-06-23 LAB — RENAL FUNCTION PANEL
ALBUMIN: 3.1 g/dL — AB (ref 3.5–5.0)
Anion gap: 9 (ref 5–15)
BUN: 45 mg/dL — ABNORMAL HIGH (ref 6–20)
CALCIUM: 8.6 mg/dL — AB (ref 8.9–10.3)
CO2: 28 mmol/L (ref 22–32)
CREATININE: 2.07 mg/dL — AB (ref 0.44–1.00)
Chloride: 102 mmol/L (ref 101–111)
GFR, EST AFRICAN AMERICAN: 27 mL/min — AB (ref >60)
GFR, EST NON AFRICAN AMERICAN: 23 mL/min — AB (ref >60)
Glucose, Bld: 102 mg/dL — ABNORMAL HIGH (ref 65–99)
PHOSPHORUS: 4.1 mg/dL (ref 2.5–4.6)
Potassium: 3.8 mmol/L (ref 3.5–5.1)
SODIUM: 139 mmol/L (ref 135–145)

## 2015-06-23 LAB — POCT HEMOGLOBIN-HEMACUE: Hemoglobin: 11.1 g/dL — ABNORMAL LOW (ref 12.0–15.0)

## 2015-06-23 MED ORDER — SODIUM CHLORIDE 0.9 % IV SOLN
510.0000 mg | INTRAVENOUS | Status: AC
  Administered 2015-06-23: 510 mg via INTRAVENOUS
  Filled 2015-06-23: qty 17

## 2015-06-23 MED ORDER — EPOETIN ALFA 20000 UNIT/ML IJ SOLN
INTRAMUSCULAR | Status: AC
  Filled 2015-06-23: qty 1

## 2015-06-23 MED ORDER — EPOETIN ALFA 20000 UNIT/ML IJ SOLN
20000.0000 [IU] | INTRAMUSCULAR | Status: DC
  Administered 2015-06-23: 20000 [IU] via SUBCUTANEOUS

## 2015-06-27 DIAGNOSIS — E1143 Type 2 diabetes mellitus with diabetic autonomic (poly)neuropathy: Secondary | ICD-10-CM | POA: Diagnosis not present

## 2015-06-27 DIAGNOSIS — I5032 Chronic diastolic (congestive) heart failure: Secondary | ICD-10-CM | POA: Diagnosis not present

## 2015-06-27 DIAGNOSIS — E1122 Type 2 diabetes mellitus with diabetic chronic kidney disease: Secondary | ICD-10-CM | POA: Diagnosis not present

## 2015-06-27 DIAGNOSIS — N183 Chronic kidney disease, stage 3 (moderate): Secondary | ICD-10-CM | POA: Diagnosis not present

## 2015-06-27 DIAGNOSIS — I129 Hypertensive chronic kidney disease with stage 1 through stage 4 chronic kidney disease, or unspecified chronic kidney disease: Secondary | ICD-10-CM | POA: Diagnosis not present

## 2015-06-27 DIAGNOSIS — I2510 Atherosclerotic heart disease of native coronary artery without angina pectoris: Secondary | ICD-10-CM | POA: Diagnosis not present

## 2015-06-29 ENCOUNTER — Other Ambulatory Visit: Payer: Self-pay

## 2015-06-29 VITALS — BP 142/78 | Wt 273.0 lb

## 2015-06-29 DIAGNOSIS — E1143 Type 2 diabetes mellitus with diabetic autonomic (poly)neuropathy: Secondary | ICD-10-CM | POA: Diagnosis not present

## 2015-06-29 DIAGNOSIS — I129 Hypertensive chronic kidney disease with stage 1 through stage 4 chronic kidney disease, or unspecified chronic kidney disease: Secondary | ICD-10-CM | POA: Diagnosis not present

## 2015-06-29 DIAGNOSIS — I5041 Acute combined systolic (congestive) and diastolic (congestive) heart failure: Secondary | ICD-10-CM

## 2015-06-29 DIAGNOSIS — I2510 Atherosclerotic heart disease of native coronary artery without angina pectoris: Secondary | ICD-10-CM | POA: Diagnosis not present

## 2015-06-29 DIAGNOSIS — E1122 Type 2 diabetes mellitus with diabetic chronic kidney disease: Secondary | ICD-10-CM | POA: Diagnosis not present

## 2015-06-29 DIAGNOSIS — I5032 Chronic diastolic (congestive) heart failure: Secondary | ICD-10-CM | POA: Diagnosis not present

## 2015-06-29 DIAGNOSIS — N183 Chronic kidney disease, stage 3 (moderate): Secondary | ICD-10-CM | POA: Diagnosis not present

## 2015-06-29 NOTE — Patient Outreach (Signed)
[facility]) Care Management  06/29/2015  Joann Gutierrez July 08, 1945 [id number]     274.3 276.2 278.0 276.2 276.6 278.5 275 273

## 2015-06-30 ENCOUNTER — Encounter (HOSPITAL_COMMUNITY)
Admission: RE | Admit: 2015-06-30 | Discharge: 2015-06-30 | Disposition: A | Discharge status: Home / Self Care | Payer: Medicare Other | Source: Ambulatory Visit | Attending: Nephrology | Admitting: Nephrology

## 2015-06-30 DIAGNOSIS — N183 Chronic kidney disease, stage 3 (moderate): Secondary | ICD-10-CM | POA: Insufficient documentation

## 2015-06-30 DIAGNOSIS — D631 Anemia in chronic kidney disease: Secondary | ICD-10-CM | POA: Insufficient documentation

## 2015-06-30 LAB — POCT HEMOGLOBIN-HEMACUE: HEMOGLOBIN: 11.7 g/dL — AB (ref 12.0–15.0)

## 2015-06-30 MED ORDER — EPOETIN ALFA 20000 UNIT/ML IJ SOLN: 20000.0000 [IU] | INTRAMUSCULAR | Status: DC

## 2015-06-30 MED ORDER — EPOETIN ALFA 20000 UNIT/ML IJ SOLN
INTRAMUSCULAR | Status: AC
  Filled 2015-06-30: qty 1

## 2015-07-04 ENCOUNTER — Ambulatory Visit: Payer: Medicare Other | Admitting: Internal Medicine

## 2015-07-04 DIAGNOSIS — E1122 Type 2 diabetes mellitus with diabetic chronic kidney disease: Secondary | ICD-10-CM | POA: Diagnosis not present

## 2015-07-04 DIAGNOSIS — N183 Chronic kidney disease, stage 3 (moderate): Secondary | ICD-10-CM | POA: Diagnosis not present

## 2015-07-04 DIAGNOSIS — I129 Hypertensive chronic kidney disease with stage 1 through stage 4 chronic kidney disease, or unspecified chronic kidney disease: Secondary | ICD-10-CM | POA: Diagnosis not present

## 2015-07-04 DIAGNOSIS — I5032 Chronic diastolic (congestive) heart failure: Secondary | ICD-10-CM | POA: Diagnosis not present

## 2015-07-04 DIAGNOSIS — E1143 Type 2 diabetes mellitus with diabetic autonomic (poly)neuropathy: Secondary | ICD-10-CM | POA: Diagnosis not present

## 2015-07-04 DIAGNOSIS — I2510 Atherosclerotic heart disease of native coronary artery without angina pectoris: Secondary | ICD-10-CM | POA: Diagnosis not present

## 2015-07-05 ENCOUNTER — Encounter (HOSPITAL_COMMUNITY): Payer: Self-pay

## 2015-07-05 ENCOUNTER — Encounter (HOSPITAL_COMMUNITY)
Admission: RE | Disposition: A | Discharge status: Home / Self Care | Payer: Self-pay | Source: Ambulatory Visit | Attending: Gastroenterology

## 2015-07-05 ENCOUNTER — Ambulatory Visit (HOSPITAL_COMMUNITY): Payer: Medicare Other | Admitting: Anesthesiology

## 2015-07-05 ENCOUNTER — Ambulatory Visit (HOSPITAL_COMMUNITY)
Admission: RE | Admit: 2015-07-05 | Discharge: 2015-07-05 | Disposition: A | Discharge status: Home / Self Care | Payer: Medicare Other | Source: Ambulatory Visit | Attending: Gastroenterology | Admitting: Gastroenterology

## 2015-07-05 DIAGNOSIS — K3189 Other diseases of stomach and duodenum: Secondary | ICD-10-CM

## 2015-07-05 DIAGNOSIS — Z8673 Personal history of transient ischemic attack (TIA), and cerebral infarction without residual deficits: Secondary | ICD-10-CM | POA: Diagnosis not present

## 2015-07-05 DIAGNOSIS — K219 Gastro-esophageal reflux disease without esophagitis: Secondary | ICD-10-CM | POA: Diagnosis not present

## 2015-07-05 DIAGNOSIS — K635 Polyp of colon: Secondary | ICD-10-CM | POA: Diagnosis not present

## 2015-07-05 DIAGNOSIS — Z6841 Body Mass Index (BMI) 40.0 and over, adult: Secondary | ICD-10-CM | POA: Diagnosis not present

## 2015-07-05 DIAGNOSIS — I2510 Atherosclerotic heart disease of native coronary artery without angina pectoris: Secondary | ICD-10-CM | POA: Diagnosis not present

## 2015-07-05 DIAGNOSIS — G4730 Sleep apnea, unspecified: Secondary | ICD-10-CM | POA: Diagnosis not present

## 2015-07-05 DIAGNOSIS — I130 Hypertensive heart and chronic kidney disease with heart failure and stage 1 through stage 4 chronic kidney disease, or unspecified chronic kidney disease: Secondary | ICD-10-CM | POA: Insufficient documentation

## 2015-07-05 DIAGNOSIS — D649 Anemia, unspecified: Secondary | ICD-10-CM | POA: Insufficient documentation

## 2015-07-05 DIAGNOSIS — Z9981 Dependence on supplemental oxygen: Secondary | ICD-10-CM | POA: Insufficient documentation

## 2015-07-05 DIAGNOSIS — K2950 Unspecified chronic gastritis without bleeding: Secondary | ICD-10-CM | POA: Insufficient documentation

## 2015-07-05 DIAGNOSIS — Z87891 Personal history of nicotine dependence: Secondary | ICD-10-CM | POA: Insufficient documentation

## 2015-07-05 DIAGNOSIS — K648 Other hemorrhoids: Secondary | ICD-10-CM | POA: Insufficient documentation

## 2015-07-05 DIAGNOSIS — I509 Heart failure, unspecified: Secondary | ICD-10-CM | POA: Diagnosis not present

## 2015-07-05 DIAGNOSIS — D120 Benign neoplasm of cecum: Secondary | ICD-10-CM | POA: Diagnosis not present

## 2015-07-05 DIAGNOSIS — E1122 Type 2 diabetes mellitus with diabetic chronic kidney disease: Secondary | ICD-10-CM | POA: Insufficient documentation

## 2015-07-05 DIAGNOSIS — J449 Chronic obstructive pulmonary disease, unspecified: Secondary | ICD-10-CM | POA: Diagnosis not present

## 2015-07-05 DIAGNOSIS — Z860100 Personal history of colon polyps, unspecified: Secondary | ICD-10-CM | POA: Insufficient documentation

## 2015-07-05 DIAGNOSIS — E785 Hyperlipidemia, unspecified: Secondary | ICD-10-CM | POA: Diagnosis not present

## 2015-07-05 DIAGNOSIS — E1142 Type 2 diabetes mellitus with diabetic polyneuropathy: Secondary | ICD-10-CM | POA: Diagnosis not present

## 2015-07-05 DIAGNOSIS — Z794 Long term (current) use of insulin: Secondary | ICD-10-CM | POA: Insufficient documentation

## 2015-07-05 DIAGNOSIS — E1143 Type 2 diabetes mellitus with diabetic autonomic (poly)neuropathy: Secondary | ICD-10-CM | POA: Insufficient documentation

## 2015-07-05 DIAGNOSIS — K3184 Gastroparesis: Secondary | ICD-10-CM | POA: Diagnosis not present

## 2015-07-05 DIAGNOSIS — B9681 Helicobacter pylori [H. pylori] as the cause of diseases classified elsewhere: Secondary | ICD-10-CM | POA: Insufficient documentation

## 2015-07-05 DIAGNOSIS — Z79899 Other long term (current) drug therapy: Secondary | ICD-10-CM | POA: Diagnosis not present

## 2015-07-05 DIAGNOSIS — N183 Chronic kidney disease, stage 3 (moderate): Secondary | ICD-10-CM | POA: Insufficient documentation

## 2015-07-05 DIAGNOSIS — Z7951 Long term (current) use of inhaled steroids: Secondary | ICD-10-CM | POA: Insufficient documentation

## 2015-07-05 DIAGNOSIS — D122 Benign neoplasm of ascending colon: Secondary | ICD-10-CM

## 2015-07-05 DIAGNOSIS — Z96652 Presence of left artificial knee joint: Secondary | ICD-10-CM | POA: Diagnosis not present

## 2015-07-05 DIAGNOSIS — Z7982 Long term (current) use of aspirin: Secondary | ICD-10-CM | POA: Diagnosis not present

## 2015-07-05 DIAGNOSIS — R1310 Dysphagia, unspecified: Secondary | ICD-10-CM | POA: Diagnosis not present

## 2015-07-05 DIAGNOSIS — M1990 Unspecified osteoarthritis, unspecified site: Secondary | ICD-10-CM | POA: Diagnosis not present

## 2015-07-05 DIAGNOSIS — Z800 Family history of malignant neoplasm of digestive organs: Secondary | ICD-10-CM | POA: Diagnosis not present

## 2015-07-05 DIAGNOSIS — K5730 Diverticulosis of large intestine without perforation or abscess without bleeding: Secondary | ICD-10-CM | POA: Insufficient documentation

## 2015-07-05 HISTORY — PX: ESOPHAGOGASTRODUODENOSCOPY (EGD) WITH PROPOFOL: SHX5813

## 2015-07-05 HISTORY — PX: COLONOSCOPY WITH PROPOFOL: SHX5780

## 2015-07-05 LAB — GLUCOSE, CAPILLARY: GLUCOSE-CAPILLARY: 78 mg/dL (ref 65–99)

## 2015-07-05 SURGERY — ESOPHAGOGASTRODUODENOSCOPY (EGD) WITH PROPOFOL
Anesthesia: Monitor Anesthesia Care

## 2015-07-05 MED ORDER — LACTATED RINGERS IV SOLN
INTRAVENOUS | Status: DC | PRN
  Administered 2015-07-05 (×2): via INTRAVENOUS

## 2015-07-05 MED ORDER — PROPOFOL 10 MG/ML IV BOLUS
INTRAVENOUS | Status: AC
  Filled 2015-07-05: qty 20

## 2015-07-05 MED ORDER — LIDOCAINE HCL (CARDIAC) 20 MG/ML IV SOLN
INTRAVENOUS | Status: DC | PRN
  Administered 2015-07-05: 50 mg via INTRAVENOUS

## 2015-07-05 MED ORDER — PROPOFOL 500 MG/50ML IV EMUL
INTRAVENOUS | Status: DC | PRN
  Administered 2015-07-05: 75 ug/kg/min via INTRAVENOUS

## 2015-07-05 MED ORDER — PROPOFOL 10 MG/ML IV BOLUS
INTRAVENOUS | Status: AC
  Filled 2015-07-05: qty 40

## 2015-07-05 MED ORDER — SODIUM CHLORIDE 0.9 % IV SOLN
INTRAVENOUS | Status: DC
  Administered 2015-07-05: 13:00:00 via INTRAVENOUS

## 2015-07-05 MED ORDER — PROPOFOL 10 MG/ML IV BOLUS
INTRAVENOUS | Status: DC | PRN
  Administered 2015-07-05: 50 mg via INTRAVENOUS
  Administered 2015-07-05: 30 mg via INTRAVENOUS
  Administered 2015-07-05: 20 mg via INTRAVENOUS
  Administered 2015-07-05: 30 mg via INTRAVENOUS

## 2015-07-05 SURGICAL SUPPLY — 25 items

## 2015-07-05 NOTE — Op Note (Signed)
[facility] Patient Name: Joann Gutierrez Procedure Date: 07/05/2015 MRN: [id number] Attending MD: Carlota Raspberry. Havery Moros , MD Date of Birth: [birth year] CSN: [id number] Age: 70 Admit Type: Outpatient Procedure:                Upper GI endoscopy Indications:              Dysphagia, hypertensive LES on manometry, prior                            benefit with remote dilation Providers:                Carlota Raspberry. Havery Moros, MD, Carolynn Comment, RN,                            Corliss Parish, Technician, Heide Scales, CRNA Referring MD:              Medicines:                Monitored Anesthesia Care Complications:            No immediate complications. Estimated blood loss:                            Minimal. Estimated Blood Loss:     Estimated blood loss was minimal. Procedure:                Pre-Anesthesia Assessment:                           - Prior to the procedure, a History and Physical                            was performed, and patient medications and                            allergies were reviewed. The patient's tolerance of                            previous anesthesia was also reviewed. The risks                            and benefits of the procedure and the sedation                            options and risks were discussed with the patient.                            All questions were answered, and informed consent                            was obtained. Prior Anticoagulants: The patient has                            taken aspirin, last dose was 1 day prior to  procedure. ASA Grade Assessment: III - A patient                            with severe systemic disease. After reviewing the                            risks and benefits, the patient was deemed in                            satisfactory condition to undergo the procedure.                           After obtaining informed consent, the endoscope was                passed under direct vision. Throughout the                            procedure, the patient's blood pressure, pulse, and                            oxygen saturations were monitored continuously. The                            [id number]) scope was introduced through the                            mouth, and advanced to the second part of duodenum.                            The upper GI endoscopy was accomplished without                            difficulty. The patient tolerated the procedure                            well. Scope In: Scope Out: Findings:      Esophagogastric landmarks were identified: the Z-line was found at 40       cm, the gastroesophageal junction was found at 40 cm and the upper       extent of the gastric folds was found at 40 cm from the incisors.      The exam of the esophagus was otherwise normal. No stenosis was noted to       explain the patient's dysphagia. The LES appeared normal.      A guidewire was placed and the scope was withdrawn. Dilation was       performed in the entire esophagus with a Savary dilator with no to mild       resistance at 17 mm and 18 mm. Post dilation inspection of the esophagus       was normal, no heme appreciated.      Patchy mildly erythematous mucosa without bleeding was found in the       gastric fundus and in the gastric body. Biopsies were taken with a cold       forceps for Helicobacter pylori testing.  The exam of the stomach was otherwise normal.      The duodenal bulb and second portion of the duodenum were normal. Impression:               - Esophagogastric landmarks identified.                           - Erythematous mucosa in the gastric fundus and                            gastric body. Biopsied.                           - Normal duodenal bulb and second portion of the                            duodenum.                           - Dilation performed in the entire esophagus up to                             31mm. Moderate Sedation:      No moderate sedation, case performed with MAC Recommendation:           - Patient has a contact number available for                            emergencies. The signs and symptoms of potential                            delayed complications were discussed with the                            patient. Return to normal activities tomorrow.                            Written discharge instructions were provided to the                            patient.                           - Resume previous diet.                           - Continue present medications.                           - Await pathology results.                           - No repeat upper endoscopy unless symptoms recur                            and dilation has provided benefit. Procedure Code(s):        --- Professional ---  [phone], Esophagogastroduodenoscopy, flexible,                            transoral; with insertion of guide wire followed by                            passage of dilator(s) through esophagus over guide                            wire                           [id number], Esophagogastroduodenoscopy, flexible,                            transoral; with biopsy, single or multiple Diagnosis Code(s):        --- Professional ---                           K31.89, Other diseases of stomach and duodenum                           R13.10, Dysphagia, unspecified CPT copyright 2016 American Medical Association. All rights reserved. The codes documented in this report are preliminary and upon coder review may  be revised to meet current compliance requirements. Remo Lipps P. Fleurette Woolbright, MD 07/05/2015 3:39:38 PM This report has been signed electronically. Number of Addenda: 0

## 2015-07-05 NOTE — H&P (View-Only) (Signed)
HPI :  70 y/o female, former patient of Dr. Deatra Ina, followed for symptoms of gastroparesis and dysphagia. She has a history of oxygen dependant COPD, CHF, and a history of CVA on aspirin.   She was last seen by PA Zehr. Since that visit she has had an esophageal manometry to evaluate her dysphagia. It showed normal motility on manometry, with the finding of a hypertensive LES, which relaxes normally and no evidence of achalasia.  She has had been having dysphagia which has persisted since that time. She reports dysphagia with most meals. She has dysphagia in the sternal notch. All types of solids can cause symptoms. She has some dysphagia to liquids as well. She last had a dilation in 2015 and it provided benefit (52 Fr dilation empirically). Over time the symptoms have slowly recurred. She has occasional heartburn. She takes omeprazole 40mg  which works fairly well for her to keep symptoms fairly well controlled. She has a history of gastroparesis for which she takes Reglan, she takes it before most meals. She thnks it helps her gastroparesis symptoms. She does not think she has any sife effect from it but states she has been on it for a long time. She denies any vomiting. She is eating okay, she denies early satiety.   GES in 2012 was normal, previously in 2009 was slightly delayed. She reports her symptoms of gastroparesis historically have been mild.   She otherwise has a history of a CVA, she thinks reports a long time ago, no recent events. She also has a history of CHF. She has chronic dyspnea on exertion.   She denies any blood in the stools. She denies constipation or diarrhea. Brother had colon cancer, around age 20s. Last colonoscopy 11/2010 - 2 adenomas removed, told to follow up in 5 years.     Past Medical History  Diagnosis Date  . Allergy   . Depression   . Diabetes mellitus 1997    Type II   . GERD (gastroesophageal reflux disease)   . Hypertension   . Hyperlipidemia   .  Osteoarthritis   . Osteoporosis   . Hair loss   . Anemia   . CAD (coronary artery disease)     Mild very minimal coronary disease with 20% obtuse marginal stenosis  . CVA (cerebral infarction)   . Diverticulosis   . Esophageal stricture   . Gastritis   . Adenomatous colon polyp   . Sickle cell trait ([place])   . Asthma        . COPD (chronic obstructive pulmonary disease) ([place])   . Secondary pulmonary hypertension ([place]) 03/07/2009  . Gastroparesis 08/21/2007  . RENAL INSUFFICIENCY 02/16/2009  . OSTEOARTHRITIS 08/09/2006  . Morbid obesity ([place])   . CHF (congestive heart failure) ([place])   . Carpal tunnel syndrome on left   . 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 Hernia, hiatal   . Seizures ([place])     pt thinks it has been several monthes since she had a seisure  . Stroke ([place])   . Hearing loss of both ears   . 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 Chronic kidney disease (CKD), stage III (moderate)   . Tubular adenoma of colon   . Esophageal dysmotility      Past Surgical History  Procedure Laterality Date  . Abdominal hysterectomy    . Replacement total knee Left 1998  . Esophagogastroduodenoscopy  06/25/2006  . Erd  08/08/2000  . Artery biopsy  01/07/2011    Procedure: MINOR BIOPSY TEMPORAL ARTERY;  Surgeon: Haywood Lasso, MD;  Location: [facility];  Service: General;  Laterality: Left;  left temporal artery biopsy  . Breast lumpectomy      benign  . Breast lumpectomy      both breast lumps removed   . Hernia repair    . Colonoscopy    . Upper gastrointestinal endoscopy    . Bil foot surgery    . Left and right heart catheterization with coronary angiogram N/A 03/03/2013    Procedure: LEFT AND RIGHT HEART CATHETERIZATION WITH CORONARY ANGIOGRAM;  Surgeon: Minus Breeding, MD;  Location: [facility] CATH LAB;  Service: Cardiovascular;  Laterality: N/A;  . Esophageal manometry N/A 03/13/2015    Procedure: ESOPHAGEAL MANOMETRY (EM);  Surgeon: Manus Gunning, MD;  Location: [facility] ENDOSCOPY;  Service: Gastroenterology;  Laterality: N/A;   Family History  Problem Relation Age of Onset  . Colon cancer Brother   . Cancer Brother     Colon Cancer  . Liver cancer Mother     Liver Cancer  . Diabetes Mother   . Kidney disease Mother   . Heart disease Mother     age 70's  . Heart disease Father 22    MI  . Heart attack Father     died of MI when pt was 58  . Sickle cell anemia Father   . Diabetes Sister   . Kidney disease Sister   . Heart disease Sister     age 8's  . Allergies Sister   . Diabetes Sister   . Kidney disease Sister   . Heart disease Sister     age 46's  . Esophageal cancer Neg Hx   . Rectal cancer Neg Hx   . Stomach cancer Neg Hx    Social History  Substance Use Topics  . Smoking status: Former Smoker -- 0.50 packs/day for 10 years    Quit date: 04/18/1980  . Smokeless tobacco: Never Used  . Alcohol Use: No   Current Outpatient Prescriptions  Medication Sig Dispense Refill  . albuterol (VENTOLIN HFA) 108 (90 BASE) MCG/ACT inhaler Inhale 2 puffs into the lungs every 6 (six) hours as needed for shortness of breath. 1 Inhaler 11  . aspirin 325 MG tablet Take 325 mg by mouth daily.     Marland Kitchen atorvastatin (LIPITOR) 40 MG tablet Take 1 tablet (40 mg total) by mouth daily. (Patient taking differently: Take 40 mg by mouth at bedtime. ) 30 tablet 6  . Calcium Carbonate-Vit D-Min (CALTRATE PLUS PO) Take 600 mg by mouth every morning.     Marland Kitchen Cod Liver Oil CAPS Take 1 capsule by mouth daily.    . cyclobenzaprine (FLEXERIL) 10 MG tablet Take 10 mg by mouth at bedtime. Reported on 02/07/2015    . dextromethorphan-guaiFENesin (ROBITUSSIN-DM) 10-100 MG/5ML liquid Take 10 mLs by mouth every 4 (four) hours as needed for cough. Reported on 01/25/2015    . donepezil (ARICEPT) 10 MG tablet Take 10 mg by mouth at bedtime.     Marland Kitchen doxazosin (CARDURA) 2 MG tablet Take 1 tablet by mouth at bedtime.    Marland Kitchen ezetimibe (ZETIA) 10 MG tablet Take 10  mg by mouth at bedtime.     . ferrous sulfate 325 (65 FE) MG EC tablet Take 325 mg by mouth every morning. Reported on 01/25/2015    . fluticasone (FLONASE) 50 MCG/ACT nasal spray Place 2 sprays into both nostrils daily. 16 g  11  . furosemide (LASIX) 80 MG tablet Take 1 tablet (80 mg total) by mouth 2 (two) times daily.    Marland Kitchen gabapentin (NEURONTIN) 300 MG capsule Take 1 capsule (300 mg total) by mouth 2 (two) times daily. 60 capsule 5  . insulin glargine (LANTUS) 100 UNIT/ML injection Inject 0.15 mLs (15 Units total) into the skin daily. (Patient taking differently: Inject 30 Units into the skin daily. ) 10 mL 11  . insulin NPH (HUMULIN N) 100 UNIT/ML injection Inject 2-13 Units into the skin 3 (three) times daily after meals. Reported on 01/25/2015    . isosorbide mononitrate (IMDUR) 120 MG 24 hr tablet Take 1 tablet (120 mg total) by mouth daily. PATIENT NEEDS TO CONTACT OFFICE FOR ADDITIONAL REFILLS 15 tablet 0  . KLOR-CON M20 20 MEQ tablet Take 1 tablet by mouth daily.     Marland Kitchen LIDODERM 5 % Place 1 patch onto the skin daily as needed (pain). Reported on 02/07/2015    . metoCLOPramide (REGLAN) 10 MG tablet TAKE ONE TABLET BY MOUTH BEFORE MEALS AND AT BEDTIME 120 tablet 3  . metolazone (ZAROXOLYN) 2.5 MG tablet Take 1 tablet (2.5 mg total) by mouth 3 (three) times a week. Take 1 tablet by mouth on Monday, Wednesday and Friday    . montelukast (SINGULAIR) 10 MG tablet Take 1 tablet (10 mg total) by mouth daily. 30 tablet 11  . Multiple Vitamins-Minerals (MULTIVITAMIN GUMMIES ADULT) CHEW Chew 1 capsule by mouth daily.    . nitroGLYCERIN (NITROSTAT) 0.4 MG SL tablet PLACE ONE TABLET UNDER THE TONGUE EVERY FIVE MINUTES AS NEEDED FOR CHEST PAIN 25 tablet 4  . Omega-3 Fatty Acids (FISH OIL) 1000 MG CAPS Take 1 capsule by mouth daily.    Marland Kitchen omeprazole (PRILOSEC) 40 MG capsule Take one po daily 90 capsule 3  . OXYGEN Inhale into the lungs. CPAP with oxygen at bedtime    . raloxifene (EVISTA) 60 MG tablet Take 60  mg by mouth every morning.     . sucralfate (CARAFATE) 1 GM/10ML suspension Take 10 mLs (1 g total) by mouth 2 (two) times daily. 1260 mL 3  . SYMBICORT 160-4.5 MCG/ACT inhaler INHALE 2 PUFFS INTO LUNGS 2 TIMES DAILY 10.2 g 5  . traMADol (ULTRAM) 50 MG tablet Take 50 mg by mouth every 6 (six) hours as needed for moderate pain. Reported on 02/07/2015    . Vitamin D, Ergocalciferol, (DRISDOL) 50000 UNITS CAPS capsule Take 50,000 Units by mouth every Monday.      No current facility-administered medications for this visit.   Allergies  Allergen Reactions  . Promethazine Hcl Other (See Comments)    REACTION: lethargy  . Morphine And Related     Family request not to be given, reports pt does not wake up when given      Review of Systems: All systems reviewed and negative except where noted in HPI.   Lab Results  Component Value Date   WBC 10.7* 05/20/2015   HGB 11.2* 06/09/2015   HCT 32.9* 05/20/2015   MCV 72.3* 05/20/2015   PLT 210 05/20/2015    Lab Results  Component Value Date   IRON 40 06/09/2015   TIBC 218* 06/09/2015   FERRITIN 379* 06/09/2015   Lab Results  Component Value Date   CREATININE 1.43* 05/26/2015   BUN 24* 05/26/2015   NA 145 05/26/2015   K 4.6 05/26/2015   CL 108 05/26/2015   CO2 25 05/26/2015     Physical Exam: BP 124/60  mmHg  Pulse 84  Ht 5\' 4"  (1.626 m)  Wt 282 lb (127.914 kg)  BMI 48.38 kg/m2 Constitutional: Pleasant,well-developed, female in no acute distress. HEENT: Normocephalic and atraumatic. Conjunctivae are normal. No scleral icterus. Neck supple.  Cardiovascular: Normal rate, regular rhythm.  Pulmonary/chest: Effort normal and breath sounds normal. No wheezing, rales or rhonchi. Abdominal: Soft, nondistended, protuberant, nontender. Bowel sounds active throughout. There are no masses palpable. No hepatomegaly. Extremities: (+) 1 LE edema Lymphadenopathy: No cervical adenopathy noted. Neurological: Alert and oriented to person place  and time. Skin: Skin is warm and dry. No rashes noted. Psychiatric: Normal mood and affect. Behavior is normal.   ASSESSMENT AND PLAN: 70 y/o female with oxygen dependant COPD, CHF, history of CVA, presenting with the following issues:  Dysphagia - longstanding as outlined above to both solids and liquids. Prior EGD in 2015 did not show any significant stenosis however she endorsed significant improvement in symptoms. Manometry showed hypertensive LES with normal relaxation and otherwise normal motility, no evidence of achalasia. Given her manometry finding, I recommend an EGD to evaluate the LES, ensure normal, and given her prior response to empiric dilation we can perform this again. Perhaps, given Venia Minks was used, she had a subtle UES stricture although this was not noted on prior barium study. I discussed risks / benefits of EGD and sedation with her. Given her need for supplemental oxygen and other medical problems, her case will be done with anesthesia support at the hospital. She will decrease aspirin to 81mg  for 5-7 days prior to the procedure given risks of bleeding with dilation.   H/o gastroparesis - she reports on longstanding Reglan although unclear how long this has been. Discussed with her that this is FDA approved for 12 weeks only. She is tolerating it well without significant side effects today, and doesn't have much symptoms for gastroparesis at present time. Would recommend decreasing her dose from QID to BID for a few weeks, and then daily, and then stop and see how she does as her prior gastric emptying study in 2012 was normal. If she has significant recurrence of symptoms she can use it again at the lowest dose needed to control symptoms, or can consider a trial of Domperidone. She agreed.   History of colon adenoma / FH of colon cancer - due for surveillance colonoscopy this year, given we are sedating her for EGD, will proceed to perform her colonoscopy at the same. Discussed  risks / benefits of the procedure and she wishes to proceed.   [place] Cellar, MD [facility] Pager [phone]

## 2015-07-05 NOTE — Interval H&P Note (Signed)
History and Physical Interval Note:  07/05/2015 1:18 PM  Joann Gutierrez  has presented today for surgery, with the diagnosis of dysphagia, gastroparesis, hx colon polyps  The various methods of treatment have been discussed with the patient and family. After consideration of risks, benefits and other options for treatment, the patient has consented to  Procedure(s): ESOPHAGOGASTRODUODENOSCOPY (EGD) WITH PROPOFOL (N/A) COLONOSCOPY WITH PROPOFOL (N/A) as a surgical intervention .  The patient's history has been reviewed, patient examined, no change in status, stable for surgery.  I have reviewed the patient's chart and labs.  Questions were answered to the patient's satisfaction.     Renelda Loma Hester Joslin

## 2015-07-05 NOTE — Anesthesia Postprocedure Evaluation (Signed)
Anesthesia Post Note  Patient: Joann Gutierrez  Procedure(s) Performed: Procedure(s) (LRB): ESOPHAGOGASTRODUODENOSCOPY (EGD) WITH PROPOFOL (N/A) COLONOSCOPY WITH PROPOFOL (N/A)  Patient location during evaluation: Endoscopy Anesthesia Type: MAC Level of consciousness: awake and alert Pain management: pain level controlled Vital Signs Assessment: post-procedure vital signs reviewed and stable Respiratory status: spontaneous breathing, nonlabored ventilation, respiratory function stable and patient connected to nasal cannula oxygen Cardiovascular status: stable and blood pressure returned to baseline Anesthetic complications: no    Last Vitals:  Filed Vitals:   07/05/15 1550 07/05/15 1600  BP: 159/82 174/71  Pulse: 66   Temp:    Resp: 20     Last Pain: There were no vitals filed for this visit.               Sharae Zappulla,JAMES TERRILL

## 2015-07-05 NOTE — Transfer of Care (Signed)
Immediate Anesthesia Transfer of Care Note  Patient: Joann Gutierrez  Procedure(s) Performed: Procedure(s): ESOPHAGOGASTRODUODENOSCOPY (EGD) WITH PROPOFOL (N/A) COLONOSCOPY WITH PROPOFOL (N/A)  Patient Location: PACU  Anesthesia Type:MAC  Level of Consciousness: awake, alert  and oriented  Airway & Oxygen Therapy: Patient Spontanous Breathing and Patient connected to nasal cannula oxygen  Post-op Assessment: Report given to RN and Post -op Vital signs reviewed and stable  Post vital signs: Reviewed and stable  Last Vitals:  Filed Vitals:   07/05/15 1301  BP: 145/52  Pulse: 77  Temp: 36.5 C  Resp: 19    Last Pain: There were no vitals filed for this visit.       Complications: No apparent anesthesia complications

## 2015-07-05 NOTE — Anesthesia Preprocedure Evaluation (Addendum)
Anesthesia Evaluation  Patient identified by MRN, date of birth, ID band Patient awake    Reviewed: Allergy & Precautions, NPO status   Airway Mallampati: II  TM Distance: >3 FB Neck ROM: Full    Dental  (+) Edentulous Upper, Poor Dentition, Missing   Pulmonary shortness of breath, asthma , sleep apnea , COPD, former smoker,    breath sounds clear to auscultation       Cardiovascular hypertension, + CAD and +CHF   Rhythm:Regular Rate:Normal + Systolic murmurs    Neuro/Psych Seizures -,   Neuromuscular disease CVA    GI/Hepatic hiatal hernia, GERD  ,  Endo/Other  diabetesMorbid obesity  Renal/GU Renal disease     Musculoskeletal  (+) Arthritis ,   Abdominal (+) + obese,   Peds  Hematology   Anesthesia Other Findings   Reproductive/Obstetrics                           Anesthesia Physical Anesthesia Plan  ASA: IV  Anesthesia Plan: MAC   Post-op Pain Management:    Induction: Intravenous  Airway Management Planned: Natural Airway and Nasal Cannula  Additional Equipment:   Intra-op Plan:   Post-operative Plan:   Informed Consent: I have reviewed the patients History and Physical, chart, labs and discussed the procedure including the risks, benefits and alternatives for the proposed anesthesia with the patient or authorized representative who has indicated his/her understanding and acceptance.   Dental advisory given  Plan Discussed with: CRNA and Surgeon  Anesthesia Plan Comments:         Anesthesia Quick Evaluation

## 2015-07-05 NOTE — Discharge Instructions (Signed)

## 2015-07-05 NOTE — Op Note (Addendum)
[facility] Patient Name: Ryleigh Fradella Procedure Date: 07/05/2015 MRN: [id number] Attending MD: Carlota Raspberry. Havery Moros , MD Date of Birth: [birth year] CSN: [id number] Age: 70 Admit Type: Outpatient Procedure:                Colonoscopy Indications:              Surveillance: Personal history of adenomatous                            polyps on last colonoscopy 5 years ago Providers:                Remo Lipps P. Havery Moros, MD, Carolynn Comment, RN,                            Corliss Parish, Technician, Heide Scales, CRNA Referring MD:              Medicines:                Monitored Anesthesia Care Complications:            No immediate complications. Estimated blood loss:                            Minimal. Estimated Blood Loss:     Estimated blood loss was minimal. Procedure:                Pre-Anesthesia Assessment:                           - Prior to the procedure, a History and Physical                            was performed, and patient medications and                            allergies were reviewed. The patient's tolerance of                            previous anesthesia was also reviewed. The risks                            and benefits of the procedure and the sedation                            options and risks were discussed with the patient.                            All questions were answered, and informed consent                            was obtained. Prior Anticoagulants: The patient has                            taken aspirin, last dose was 1 day prior to  procedure. ASA Grade Assessment: III - A patient                            with severe systemic disease. After reviewing the                            risks and benefits, the patient was deemed in                            satisfactory condition to undergo the procedure.                           After obtaining informed consent, the colonoscope                             was passed under direct vision. Throughout the                            procedure, the patient's blood pressure, pulse, and                            oxygen saturations were monitored continuously. The                            [id number]) scope was introduced through                            the anus and advanced to the the cecum, identified                            by appendiceal orifice and ileocecal valve. The                            colonoscopy was performed without difficulty. The                            patient tolerated the procedure well. The quality                            of the bowel preparation was adequate. The                            ileocecal valve, appendiceal orifice, and rectum                            were photographed. Scope In: 2:50:57 PM Scope Out: 3:23:38 PM Scope Withdrawal Time: 0 hours 25 minutes 52 seconds  Total Procedure Duration: 0 hours 32 minutes 41 seconds  Findings:      The perianal and digital rectal examinations were normal.      A 4 mm polyp was found in the cecum. The polyp was sessile. The polyp       was removed with a cold snare. Resection and retrieval were complete.      A 3 mm polyp was found  in the cecum. The polyp was sessile. The polyp       was removed with a cold biopsy forceps. Resection and retrieval were       complete.      A 12 mm polyp was found in the ascending colon. The polyp was       semi-pedunculated. The polyp was removed with a hot snare. Resection and       retrieval were complete.      A 8 mm polyp was found in the ascending colon. The polyp was       semi-pedunculated. The polyp was removed with a hot snare. Resection and       retrieval were complete.      Multiple small and large-mouthed diverticula were found in the       transverse colon and left colon.      Non-bleeding internal hemorrhoids were found during retroflexion.      The exam was otherwise without  abnormality. Impression:               - One 4 mm polyp in the cecum, removed with a cold                            snare. Resected and retrieved.                           - One 3 mm polyp in the cecum, removed with a cold                            biopsy forceps. Resected and retrieved.                           - One 12 mm polyp in the ascending colon, removed                            with a hot snare. Resected and retrieved.                           - One 8 mm polyp in the ascending colon, removed                            with a hot snare. Resected and retrieved.                           - Diverticulosis in the transverse colon and in the                            left colon.                           - Non-bleeding internal hemorrhoids.                           - The examination was otherwise normal. Moderate Sedation:      No moderate sedation, case performed with MAC Recommendation:           - Patient has a contact number available for  emergencies. The signs and symptoms of potential                            delayed complications were discussed with the                            patient. Return to normal activities tomorrow.                            Written discharge instructions were provided to the                            patient.                           - Resume previous diet.                           - Continue present medications, okay to continue                            baby aspirin                           - No ibuprofen, naproxen, or other non-steroidal                            anti-inflammatory drugs for 2 weeks after polyp                            removal.                           - Await pathology results.                           - Repeat colonoscopy is recommended for                            surveillance. The colonoscopy date will be                            determined after pathology results from  today's                            exam become available for review. Procedure Code(s):        --- Professional ---                           [phone], Colonoscopy, flexible; with removal of                            tumor(s), polyp(s), or other lesion(s) by snare                            technique  [id number], 58, Colonoscopy, flexible; with biopsy,                            single or multiple Diagnosis Code(s):        --- Professional ---                           Z86.010, Personal history of colonic polyps                           D12.0, Benign neoplasm of cecum                           D12.2, Benign neoplasm of ascending colon                           K64.8, Other hemorrhoids                           K57.30, Diverticulosis of large intestine without                            perforation or abscess without bleeding CPT copyright 2016 American Medical Association. All rights reserved. The codes documented in this report are preliminary and upon coder review may  be revised to meet current compliance requirements. Remo Lipps P. Armbruster, MD 07/05/2015 3:33:29 PM This report has been signed electronically. Number of Addenda: 1 Addendum Number: 1   Addendum Date: 07/05/2015 3:40:57 PM      Of note, the bowel prep was only fair upon intubation and several       minutes were spent lavaging the colon on withdrawal, but adequate views       were obtained. Remo Lipps P. Armbruster, MD 07/05/2015 3:41:36 PM This report has been signed electronically.

## 2015-07-06 ENCOUNTER — Encounter (HOSPITAL_COMMUNITY): Payer: Self-pay | Admitting: Gastroenterology

## 2015-07-06 DIAGNOSIS — N183 Chronic kidney disease, stage 3 (moderate): Secondary | ICD-10-CM | POA: Diagnosis not present

## 2015-07-06 DIAGNOSIS — E1122 Type 2 diabetes mellitus with diabetic chronic kidney disease: Secondary | ICD-10-CM | POA: Diagnosis not present

## 2015-07-06 DIAGNOSIS — E1143 Type 2 diabetes mellitus with diabetic autonomic (poly)neuropathy: Secondary | ICD-10-CM | POA: Diagnosis not present

## 2015-07-06 DIAGNOSIS — I5032 Chronic diastolic (congestive) heart failure: Secondary | ICD-10-CM | POA: Diagnosis not present

## 2015-07-06 DIAGNOSIS — I129 Hypertensive chronic kidney disease with stage 1 through stage 4 chronic kidney disease, or unspecified chronic kidney disease: Secondary | ICD-10-CM | POA: Diagnosis not present

## 2015-07-06 DIAGNOSIS — I2510 Atherosclerotic heart disease of native coronary artery without angina pectoris: Secondary | ICD-10-CM | POA: Diagnosis not present

## 2015-07-07 ENCOUNTER — Other Ambulatory Visit: Payer: Self-pay

## 2015-07-07 ENCOUNTER — Encounter (HOSPITAL_COMMUNITY)
Admission: RE | Admit: 2015-07-07 | Discharge: 2015-07-07 | Disposition: A | Discharge status: Home / Self Care | Payer: Medicare Other | Source: Ambulatory Visit | Attending: Nephrology | Admitting: Nephrology

## 2015-07-07 ENCOUNTER — Encounter (HOSPITAL_COMMUNITY): Payer: Medicare Other

## 2015-07-07 NOTE — Patient Outreach (Signed)
Unsuccessful attempt made to contact patient via telephone for assessment of chronic disease management.  Plan: Attempt to make contact with patient next week

## 2015-07-10 ENCOUNTER — Other Ambulatory Visit: Payer: Self-pay | Admitting: *Deleted

## 2015-07-10 DIAGNOSIS — R768 Other specified abnormal immunological findings in serum: Secondary | ICD-10-CM

## 2015-07-10 MED ORDER — BIS SUBCIT-METRONID-TETRACYC 140-125-125 MG PO CAPS: 3.0000 | ORAL_CAPSULE | Freq: Three times a day (TID) | ORAL | Status: DC

## 2015-07-11 ENCOUNTER — Telehealth: Payer: Self-pay | Admitting: Gastroenterology

## 2015-07-11 DIAGNOSIS — N183 Chronic kidney disease, stage 3 (moderate): Secondary | ICD-10-CM | POA: Diagnosis not present

## 2015-07-11 DIAGNOSIS — I2510 Atherosclerotic heart disease of native coronary artery without angina pectoris: Secondary | ICD-10-CM | POA: Diagnosis not present

## 2015-07-11 DIAGNOSIS — E1143 Type 2 diabetes mellitus with diabetic autonomic (poly)neuropathy: Secondary | ICD-10-CM | POA: Diagnosis not present

## 2015-07-11 DIAGNOSIS — I129 Hypertensive chronic kidney disease with stage 1 through stage 4 chronic kidney disease, or unspecified chronic kidney disease: Secondary | ICD-10-CM | POA: Diagnosis not present

## 2015-07-11 DIAGNOSIS — E1122 Type 2 diabetes mellitus with diabetic chronic kidney disease: Secondary | ICD-10-CM | POA: Diagnosis not present

## 2015-07-11 DIAGNOSIS — I5032 Chronic diastolic (congestive) heart failure: Secondary | ICD-10-CM | POA: Diagnosis not present

## 2015-07-11 NOTE — Telephone Encounter (Signed)
[employer] has approved [facility] from 04/12/15-07/10/16.

## 2015-07-11 NOTE — Telephone Encounter (Signed)
Prior auth initiated through [email or web address]

## 2015-07-12 ENCOUNTER — Ambulatory Visit (INDEPENDENT_AMBULATORY_CARE_PROVIDER_SITE_OTHER): Payer: Medicare Other | Admitting: Podiatry

## 2015-07-12 ENCOUNTER — Telehealth: Payer: Self-pay | Admitting: Gastroenterology

## 2015-07-12 ENCOUNTER — Encounter: Payer: Self-pay | Admitting: Podiatry

## 2015-07-12 VITALS — BP 164/71 | HR 73

## 2015-07-12 DIAGNOSIS — M79606 Pain in leg, unspecified: Secondary | ICD-10-CM | POA: Diagnosis not present

## 2015-07-12 DIAGNOSIS — B351 Tinea unguium: Secondary | ICD-10-CM | POA: Diagnosis not present

## 2015-07-12 NOTE — Patient Instructions (Signed)
Seen for hypertrophic nails. All nails debrided. Return in 3 months or as needed.  

## 2015-07-12 NOTE — Telephone Encounter (Signed)
Left a message for patient to call back. 

## 2015-07-12 NOTE — Progress Notes (Signed)
Subjective: 70 y.o. year old diabetic female patient presents walking with a cane complaining of painful nails. She fell and hit a table with big toe and is very sore.  Blood sugar was 121 this morning.  Patient is wearing well supported tennis shoes.   Objective: Dermatologic:   Hypertrophic nails x 10.  No plantar lesions, no abnormal skin lesions noted.  Normal color and no broken skin on injured toe left hallux.  Vascular:  All pedal pulses are palpable bilateral.  No pedal edema noted.  Orthopedic:  Mild hallux valgus with bunion on left foot.  Neurologic:  All epicritic and tactile sensations grossly intact.   Assessment:  Dystrophic mycotic nails x 10.  IDDM, under control.  Painful feet.   Treatment: All mycotic nails debrided.  Return in 3 months or as needed

## 2015-07-13 ENCOUNTER — Encounter: Payer: Self-pay | Admitting: Internal Medicine

## 2015-07-13 ENCOUNTER — Telehealth: Payer: Self-pay | Admitting: Gastroenterology

## 2015-07-13 ENCOUNTER — Other Ambulatory Visit: Payer: Self-pay

## 2015-07-13 ENCOUNTER — Ambulatory Visit: Payer: Self-pay

## 2015-07-13 ENCOUNTER — Other Ambulatory Visit (HOSPITAL_COMMUNITY): Payer: Self-pay | Admitting: *Deleted

## 2015-07-13 ENCOUNTER — Ambulatory Visit (INDEPENDENT_AMBULATORY_CARE_PROVIDER_SITE_OTHER): Payer: Medicare Other | Admitting: Internal Medicine

## 2015-07-13 VITALS — BP 124/90 | HR 59 | Ht 64.0 in | Wt 278.6 lb

## 2015-07-13 DIAGNOSIS — J449 Chronic obstructive pulmonary disease, unspecified: Secondary | ICD-10-CM | POA: Diagnosis not present

## 2015-07-13 DIAGNOSIS — J45909 Unspecified asthma, uncomplicated: Secondary | ICD-10-CM

## 2015-07-13 NOTE — Telephone Encounter (Signed)
Patient given samples of Pylera due to high cost of prescription. Samples up front for pick up.

## 2015-07-13 NOTE — Progress Notes (Signed)
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   TESTS: PSG 04/28/09 >> AHI 11, SpO2 low 68% Spirometry 07/05/10 >> FEV1 1.30 (64%), FEV1% 80 >> coughing during test Echo 05/21/11 >> EF 55 to 60%, mild LVH, mild/mod TR, PAS 45 mmHg ONO with CPAP and room air 10/22/12 >> Test time 10 hrs 59 min. Mean SpO2 92.4%, low SpO2 75%. Spent 16 min with SpO2 < 89%. RAST 12/11/12 >> negative, IgE 63.3 Echo 02/04/13 >> mod LVH, EF 65 to [id number], grade 1 diastolic dysfx, PAS 42 mmHg Lt, Rt Heart cath 03/03/13 >> RA 10, RV 38/7, PA 41/16 28, PCWP 13, LV 142/5, AO 137/76 PFT 03/17/13 >> FEV1 1.27 (67%), FEV1% 84, TLC 4.06 (80%), DLCO 57%, borderline BD      05/20/2014 acute  ov/Katya Rolston re: aeAB  Chief Complaint  Patient presents with  . Acute Visit    Pt c/o cough, wheezing, increased SOB and chest tightness for the past 5 days. Cough is prod with moderate green sputum. She is using albuterol inhaler 2 x daily on average.  normally uses nebulizer twice daily but has not worked for a month "making an odd sound" Thoroughly confused with meds/ names/ timing maint vs prn and worse at hs despite cpap. Still comfortable at rest even s saba  rec Augmentin 875 mg take one pill twice daily  X 10 days - take at breakfast and supper with large glass of water.  It would help reduce the usual side effects (diarrhea and yeast infections) if you ate cultured yogurt at lunch.  Prednisone 10 mg take  4 each am x 2 days,   2 each am x 2 days,  1 each am x 2 days and stop If needed nebulizer up to every 4 hours if needed for breathlessness  For cough use flutter valve      09/22/2014 f/u ov/Cosby Proby re: uacs  /gastroparesis related ?  With no improvement is sob since last eval  Chief Complaint  Patient presents with  . Acute Visit    Pt c/o increased  SOB "since the last time I was here". She also c/o difficulty swallowing and getting choked on food for the past month.   all  symptoms resolve with cpap day or noct but doe is persistent daily then choking worse x one month Not at all clear what meds she takes / was on reglan not clear when she stopped it rec metaclopromide 10 mg (reglan) is 10 mg before meals and at bedtime Omeprazole 20 mg Take 30- 60 min before your first and last meals of the day  GERD diet     07/13/2015  f/u ov/Acelynn Dejonge re: AB vs uacs / obesity / deconditioning  Chief Complaint  Patient presents with  . Follow-up    Breathing is "doing a little bit better"- no new co's.  She uses albuterol 2 x daily on average.   MMRC3 = can't walk 100 yards even at a slow pace at a flat grade s stopping due to sob  - can't do [employer] consistently   Very confused with meds   No obvious pattern in  day to day or daytime variabilty or assoc excess/ purulent sputum production or cp or chest tightness overt sinus or hb symptoms. No  unusual exp hx or h/o childhood pna/ asthma or knowledge of premature birth.  Sleeping ok without nocturnal  or early am exacerbation  of respiratory  c/o's or need for noct saba. Also denies any obvious fluctuation of symptoms with weather or environmental changes or other aggravating or alleviating factors except as outlined above   Current Medications, Allergies, Complete Past Medical History, Past Surgical History, Family History, and Social History were reviewed in [employer] record.  ROS  The following are not active complaints unless bolded sore throat, dysphagia, dental problems, itching, sneezing,  nasal congestion or excess/ purulent secretions, ear ache,   fever, chills, sweats, unintended wt loss, pleuritic or exertional cp, hemoptysis,  orthopnea pnd or leg swelling, presyncope, palpitations, heartburn, abdominal pain, anorexia, nausea, vomiting, diarrhea  or change in bowel or  urinary habits, change in stools or urine, dysuria,hematuria,  rash, arthralgias, visual complaints, headache, numbness weakness or ataxia or problems with walking or coordination,  change in mood/affect or memory.            Objective:   Physical Exam  09/22/2014          288 >  07/13/2015   279     05/20/14 278 lb (126.1 kg)  04/06/14 267 lb (121.11 kg)  03/04/14 267 lb 12.8 oz (121.473 kg)    Vital signs reviewed     Gen.   Obese,anxious amb bf ok at rest / answers questions"ok" ENT - no lesions, no post nasal drip, class 2-3 MPairway Neck: No JVD, no thyromegaly, no carotid bruits Lungs: no use of accessory muscles, pseudowheeze only better with plm Cardiovascular: Rhythm regular, heart sounds  normal, no murmurs or gallops, trace pitting bilateral lower ext sym  edema Abdomen: soft and non-tender, no hepatosplenomegaly, BS normal. Musculoskeletal: No deformities, no cyanosis or clubbing Neuro:  alert, non focal, no tremors             I personally reviewed images and agree with radiology impression as follows:  CXR:  05/20/15  Mild vascular congestion and mild cardiomegaly. Mildly increased interstitial markings may reflect minimal interstitial edema.       Assessment & Plan:

## 2015-07-13 NOTE — Patient Outreach (Signed)
[facility]) Care Management  07/13/2015  Joann Gutierrez 1945/02/10 [id number]   Home visit for discharge and case closure.. Patient had called earlier requesting information on a primary care physician. Patient informed this RNCM she does to feel her current primary care physician and she are not understanding each other. This RNCM advised patient she could not advise he ron choice of primary care providers.  Patient was given information on how to search for primary care physician and list of [place] primary care physicians.  Patient agreed to discharge, from community care coordination. RNCM advised patient she could receive services in the future if needs by self or medical provider referral. Patient was given this RNCM business card with list contact information.   Plan: Discharge patient from caseload, send discharge letter to primary care physician and patient.

## 2015-07-13 NOTE — Telephone Encounter (Signed)
Spoke with patient and she states [name] had been giving her Carafate. She states Dr. Deatra Ina gave it to her along time ago and it "helps me have a bowel movement." Please,advise if patient can have a prescription for Carafate.

## 2015-07-13 NOTE — Telephone Encounter (Signed)
I would not recommend Carafate if she is using it for constipation. If she needs something to help her bowels I would ask her to try miralax if she has not yet tried it. She can titrate up the dose PRN. Thanks

## 2015-07-13 NOTE — Patient Instructions (Signed)
No change in medications/ keep working on weight loss  Please schedule a follow up visit in 3 months but call sooner if needed to see DR Halford Chessman

## 2015-07-13 NOTE — Telephone Encounter (Signed)
Left patient a message that I think she received an old message I left her.

## 2015-07-14 ENCOUNTER — Encounter (HOSPITAL_COMMUNITY)
Admission: RE | Admit: 2015-07-14 | Discharge: 2015-07-14 | Disposition: A | Discharge status: Home / Self Care | Payer: Medicare Other | Source: Ambulatory Visit | Attending: Nephrology | Admitting: Nephrology

## 2015-07-14 DIAGNOSIS — N183 Chronic kidney disease, stage 3 (moderate): Secondary | ICD-10-CM | POA: Diagnosis not present

## 2015-07-14 DIAGNOSIS — D631 Anemia in chronic kidney disease: Secondary | ICD-10-CM | POA: Diagnosis not present

## 2015-07-14 LAB — IRON AND TIBC
Iron: 107 ug/dL (ref 28–170)
Saturation Ratios: 55 % — ABNORMAL HIGH (ref 10.4–31.8)
TIBC: 193 ug/dL — ABNORMAL LOW (ref 250–450)
UIBC: 86 ug/dL

## 2015-07-14 LAB — FERRITIN: FERRITIN: 583 ng/mL — AB (ref 11–307)

## 2015-07-14 LAB — POCT HEMOGLOBIN-HEMACUE: HEMOGLOBIN: 10.4 g/dL — AB (ref 12.0–15.0)

## 2015-07-14 MED ORDER — EPOETIN ALFA 20000 UNIT/ML IJ SOLN
INTRAMUSCULAR | Status: AC
  Filled 2015-07-14: qty 1

## 2015-07-14 MED ORDER — EPOETIN ALFA 20000 UNIT/ML IJ SOLN
20000.0000 [IU] | INTRAMUSCULAR | Status: DC
  Administered 2015-07-14: 20000 [IU] via SUBCUTANEOUS

## 2015-07-14 NOTE — Telephone Encounter (Signed)
Unable to leave a message for patient. Will try again later.

## 2015-07-15 LAB — PTH, INTACT AND CALCIUM
CALCIUM TOTAL (PTH): 8.6 mg/dL — AB (ref 8.7–10.3)
PTH: 77 pg/mL — ABNORMAL HIGH (ref 15–65)

## 2015-07-17 ENCOUNTER — Encounter: Payer: Self-pay | Admitting: Internal Medicine

## 2015-07-17 DIAGNOSIS — E1122 Type 2 diabetes mellitus with diabetic chronic kidney disease: Secondary | ICD-10-CM | POA: Diagnosis not present

## 2015-07-17 DIAGNOSIS — I5032 Chronic diastolic (congestive) heart failure: Secondary | ICD-10-CM | POA: Diagnosis not present

## 2015-07-17 DIAGNOSIS — E1143 Type 2 diabetes mellitus with diabetic autonomic (poly)neuropathy: Secondary | ICD-10-CM | POA: Diagnosis not present

## 2015-07-17 DIAGNOSIS — N183 Chronic kidney disease, stage 3 (moderate): Secondary | ICD-10-CM | POA: Diagnosis not present

## 2015-07-17 DIAGNOSIS — I129 Hypertensive chronic kidney disease with stage 1 through stage 4 chronic kidney disease, or unspecified chronic kidney disease: Secondary | ICD-10-CM | POA: Diagnosis not present

## 2015-07-17 DIAGNOSIS — I2510 Atherosclerotic heart disease of native coronary artery without angina pectoris: Secondary | ICD-10-CM | POA: Diagnosis not present

## 2015-07-17 NOTE — Assessment & Plan Note (Signed)
Body mass index is 47.8 trending down   Lab Results  Component Value Date   TSH 1.555 05/20/2015     Contributing to gerd tendency/ doe/reviewed the need and the process to achieve and maintain neg calorie balance > defer f/u primary care including intermittently monitoring thyroid status

## 2015-07-17 NOTE — Assessment & Plan Note (Signed)
Still overusing saba  - The proper method of use, as well as anticipated side effects, of a metered-dose inhaler are discussed and demonstrated to the patient. Improved effectiveness after extensive coaching during this visit to a level of approximately 75 % from a baseline of 50 % > continue symbicor t160 2bid  I had an extended discussion with the patient reviewing all relevant studies completed to date and  lasting 15 to 20 minutes of a 25 minute visit    Each maintenance medication was reviewed in detail including most importantly the difference between maintenance and prns and under what circumstances the prns are to be triggered using an action plan format that is not reflected in the computer generated alphabetically organized AVS.    Please see instructions for details which were reviewed in writing and the patient given a copy highlighting the part that I personally wrote and discussed at today's ov.  Marland Kitchen

## 2015-07-17 NOTE — Telephone Encounter (Signed)
Left a message for patient to call back. 

## 2015-07-18 ENCOUNTER — Other Ambulatory Visit: Payer: Self-pay

## 2015-07-18 ENCOUNTER — Ambulatory Visit: Payer: Self-pay

## 2015-07-18 NOTE — Telephone Encounter (Signed)
Patient notified of recommendation. 

## 2015-07-21 ENCOUNTER — Encounter (HOSPITAL_COMMUNITY): Payer: Medicare Other

## 2015-07-21 ENCOUNTER — Encounter (HOSPITAL_COMMUNITY)
Admission: RE | Admit: 2015-07-21 | Discharge: 2015-07-21 | Disposition: A | Discharge status: Home / Self Care | Payer: Medicare Other | Source: Ambulatory Visit | Attending: Nephrology | Admitting: Nephrology

## 2015-07-21 DIAGNOSIS — N183 Chronic kidney disease, stage 3 (moderate): Secondary | ICD-10-CM | POA: Diagnosis not present

## 2015-07-21 DIAGNOSIS — D631 Anemia in chronic kidney disease: Secondary | ICD-10-CM | POA: Diagnosis not present

## 2015-07-21 LAB — CBC
HCT: 34.6 % — ABNORMAL LOW (ref 36.0–46.0)
Hemoglobin: 10.5 g/dL — ABNORMAL LOW (ref 12.0–15.0)
MCH: 23.2 pg — AB (ref 26.0–34.0)
MCHC: 30.3 g/dL (ref 30.0–36.0)
MCV: 76.4 fL — ABNORMAL LOW (ref 78.0–100.0)
PLATELETS: 200 10*3/uL (ref 150–400)
RBC: 4.53 MIL/uL (ref 3.87–5.11)
RDW: 17.6 % — ABNORMAL HIGH (ref 11.5–15.5)
WBC: 6.7 10*3/uL (ref 4.0–10.5)

## 2015-07-21 LAB — RENAL FUNCTION PANEL
ALBUMIN: 3.1 g/dL — AB (ref 3.5–5.0)
ANION GAP: 8 (ref 5–15)
BUN: 51 mg/dL — ABNORMAL HIGH (ref 6–20)
CALCIUM: 9.1 mg/dL (ref 8.9–10.3)
CO2: 27 mmol/L (ref 22–32)
Chloride: 109 mmol/L (ref 101–111)
Creatinine, Ser: 2 mg/dL — ABNORMAL HIGH (ref 0.44–1.00)
GFR calc Af Amer: 28 mL/min — ABNORMAL LOW (ref >60)
GFR, EST NON AFRICAN AMERICAN: 24 mL/min — AB (ref >60)
GLUCOSE: 101 mg/dL — AB (ref 65–99)
PHOSPHORUS: 4.5 mg/dL (ref 2.5–4.6)
Potassium: 4.2 mmol/L (ref 3.5–5.1)
SODIUM: 144 mmol/L (ref 135–145)

## 2015-07-21 LAB — MAGNESIUM: Magnesium: 2.3 mg/dL (ref 1.7–2.4)

## 2015-07-21 MED ORDER — EPOETIN ALFA 20000 UNIT/ML IJ SOLN
INTRAMUSCULAR | Status: AC
  Filled 2015-07-21: qty 1

## 2015-07-21 MED ORDER — EPOETIN ALFA 20000 UNIT/ML IJ SOLN
20000.0000 [IU] | INTRAMUSCULAR | Status: DC
  Administered 2015-07-21: 20000 [IU] via SUBCUTANEOUS

## 2015-07-27 DIAGNOSIS — I129 Hypertensive chronic kidney disease with stage 1 through stage 4 chronic kidney disease, or unspecified chronic kidney disease: Secondary | ICD-10-CM | POA: Diagnosis not present

## 2015-07-27 DIAGNOSIS — E1122 Type 2 diabetes mellitus with diabetic chronic kidney disease: Secondary | ICD-10-CM | POA: Diagnosis not present

## 2015-07-27 DIAGNOSIS — E1143 Type 2 diabetes mellitus with diabetic autonomic (poly)neuropathy: Secondary | ICD-10-CM | POA: Diagnosis not present

## 2015-07-27 DIAGNOSIS — I2510 Atherosclerotic heart disease of native coronary artery without angina pectoris: Secondary | ICD-10-CM | POA: Diagnosis not present

## 2015-07-27 DIAGNOSIS — N183 Chronic kidney disease, stage 3 (moderate): Secondary | ICD-10-CM | POA: Diagnosis not present

## 2015-07-27 DIAGNOSIS — I5032 Chronic diastolic (congestive) heart failure: Secondary | ICD-10-CM | POA: Diagnosis not present

## 2015-07-28 ENCOUNTER — Encounter (HOSPITAL_COMMUNITY)
Admission: RE | Admit: 2015-07-28 | Discharge: 2015-07-28 | Disposition: A | Discharge status: Home / Self Care | Payer: Medicare Other | Source: Ambulatory Visit | Attending: Nephrology | Admitting: Nephrology

## 2015-07-28 DIAGNOSIS — F329 Major depressive disorder, single episode, unspecified: Secondary | ICD-10-CM | POA: Diagnosis not present

## 2015-07-28 DIAGNOSIS — I2510 Atherosclerotic heart disease of native coronary artery without angina pectoris: Secondary | ICD-10-CM | POA: Diagnosis not present

## 2015-07-28 DIAGNOSIS — N183 Chronic kidney disease, stage 3 (moderate): Secondary | ICD-10-CM | POA: Insufficient documentation

## 2015-07-28 DIAGNOSIS — I5032 Chronic diastolic (congestive) heart failure: Secondary | ICD-10-CM | POA: Diagnosis not present

## 2015-07-28 DIAGNOSIS — E1142 Type 2 diabetes mellitus with diabetic polyneuropathy: Secondary | ICD-10-CM | POA: Diagnosis not present

## 2015-07-28 DIAGNOSIS — D649 Anemia, unspecified: Secondary | ICD-10-CM | POA: Diagnosis not present

## 2015-07-28 DIAGNOSIS — M810 Age-related osteoporosis without current pathological fracture: Secondary | ICD-10-CM | POA: Diagnosis not present

## 2015-07-28 DIAGNOSIS — M150 Primary generalized (osteo)arthritis: Secondary | ICD-10-CM | POA: Diagnosis not present

## 2015-07-28 DIAGNOSIS — J45909 Unspecified asthma, uncomplicated: Secondary | ICD-10-CM | POA: Diagnosis not present

## 2015-07-28 DIAGNOSIS — Z794 Long term (current) use of insulin: Secondary | ICD-10-CM | POA: Diagnosis not present

## 2015-07-28 DIAGNOSIS — E785 Hyperlipidemia, unspecified: Secondary | ICD-10-CM | POA: Diagnosis not present

## 2015-07-28 DIAGNOSIS — E1122 Type 2 diabetes mellitus with diabetic chronic kidney disease: Secondary | ICD-10-CM | POA: Diagnosis not present

## 2015-07-28 DIAGNOSIS — D631 Anemia in chronic kidney disease: Secondary | ICD-10-CM | POA: Diagnosis not present

## 2015-07-28 DIAGNOSIS — Z8673 Personal history of transient ischemic attack (TIA), and cerebral infarction without residual deficits: Secondary | ICD-10-CM | POA: Diagnosis not present

## 2015-07-28 DIAGNOSIS — K5790 Diverticulosis of intestine, part unspecified, without perforation or abscess without bleeding: Secondary | ICD-10-CM | POA: Diagnosis not present

## 2015-07-28 DIAGNOSIS — K219 Gastro-esophageal reflux disease without esophagitis: Secondary | ICD-10-CM | POA: Diagnosis not present

## 2015-07-28 DIAGNOSIS — I1310 Hypertensive heart and chronic kidney disease without heart failure, with stage 1 through stage 4 chronic kidney disease, or unspecified chronic kidney disease: Secondary | ICD-10-CM | POA: Diagnosis not present

## 2015-07-28 DIAGNOSIS — E1143 Type 2 diabetes mellitus with diabetic autonomic (poly)neuropathy: Secondary | ICD-10-CM | POA: Diagnosis not present

## 2015-07-28 LAB — POCT HEMOGLOBIN-HEMACUE: HEMOGLOBIN: 10.3 g/dL — AB (ref 12.0–15.0)

## 2015-07-28 MED ORDER — EPOETIN ALFA 20000 UNIT/ML IJ SOLN
20000.0000 [IU] | INTRAMUSCULAR | Status: DC
  Administered 2015-07-28: 20000 [IU] via SUBCUTANEOUS

## 2015-07-28 MED ORDER — EPOETIN ALFA 20000 UNIT/ML IJ SOLN
INTRAMUSCULAR | Status: AC
Start: 2015-07-28 — End: 2015-07-28
  Filled 2015-07-28: qty 1

## 2015-08-02 DIAGNOSIS — I5032 Chronic diastolic (congestive) heart failure: Secondary | ICD-10-CM | POA: Diagnosis not present

## 2015-08-02 DIAGNOSIS — N183 Chronic kidney disease, stage 3 (moderate): Secondary | ICD-10-CM | POA: Diagnosis not present

## 2015-08-02 DIAGNOSIS — I2510 Atherosclerotic heart disease of native coronary artery without angina pectoris: Secondary | ICD-10-CM | POA: Diagnosis not present

## 2015-08-02 DIAGNOSIS — I1310 Hypertensive heart and chronic kidney disease without heart failure, with stage 1 through stage 4 chronic kidney disease, or unspecified chronic kidney disease: Secondary | ICD-10-CM | POA: Diagnosis not present

## 2015-08-02 DIAGNOSIS — E1143 Type 2 diabetes mellitus with diabetic autonomic (poly)neuropathy: Secondary | ICD-10-CM | POA: Diagnosis not present

## 2015-08-02 DIAGNOSIS — E1122 Type 2 diabetes mellitus with diabetic chronic kidney disease: Secondary | ICD-10-CM | POA: Diagnosis not present

## 2015-08-03 ENCOUNTER — Encounter: Payer: Self-pay | Admitting: Skilled Nursing Facility1

## 2015-08-03 ENCOUNTER — Encounter: Payer: Medicare Other | Attending: Internal Medicine | Admitting: Skilled Nursing Facility1

## 2015-08-03 ENCOUNTER — Other Ambulatory Visit: Payer: Self-pay | Admitting: Internal Medicine

## 2015-08-03 DIAGNOSIS — Z029 Encounter for administrative examinations, unspecified: Secondary | ICD-10-CM | POA: Diagnosis present

## 2015-08-03 DIAGNOSIS — E119 Type 2 diabetes mellitus without complications: Secondary | ICD-10-CM

## 2015-08-03 NOTE — Patient Instructions (Addendum)
-  Try to walk a half of a block most days of the week or around the church parking lot -Eat three meals a day with snacks in between -A meal: carbohydrate, protein, vegetable -A snack: Carbohydrate and Protein -For breakfast: one serving size of cereal, just enough milk to cover it, 6 ounce glass of milk, one slice of toast, one egg, one slice of bacon and a cup of coffee -cut your sandwiches in half for snack -2 hours after you eat you want your blood sugar to be less than 180 -Before you eat you want your blood sugar to be under 130 but above 70 -Drink juice if your blood sugar is below 70

## 2015-08-03 NOTE — Progress Notes (Signed)
Diabetes Self-Management Education  Visit Type: First/Initial  Appt. Start Time: 8:40 Appt. End Time: 10:20  08/03/2015  [name], identified by name and date of birth, is a 70 y.o. female with a diagnosis of Diabetes: Type 2.   ASSESSMENT  There were no vitals taken for this visit. There is no weight on file to calculate BMI.  Pt states she Didn't know how bad diabetes was until she got sick from it (referring to HTN and CKD). Pt states her  Mother and sister had dialysis which scares her. Pts husband states she does not wear her C-PAP and does not do her breathing treatments. Pts husband was able to fully comprehend the information and he is the primary cook and food preparer.  Pt had a far away look in her eye as she attempted a dietary recall. Dietitian is unsure if the pt had cognitive difficulties hindering her comprehension or a hearing issue. A few times the pt responded with "I can't remember" as she longingly stared off into the distance.       Diabetes Self-Management Education - 08/03/15 0903    Visit Information   Visit Type First/Initial   Initial Visit   Diabetes Type Type 2   Are you currently following a meal plan? No   Are you taking your medications as prescribed? Yes   Date Diagnosed Many years   Health Coping   How would you rate your overall health? Fair   Psychosocial Assessment   Patient Belief/Attitude about Diabetes Defeat/Burnout   Self-care barriers Impaired vision   Self-management support Family   Other persons present Spouse/SO   Patient Concerns Nutrition/Meal planning;Glycemic Control   Pre-Education Assessment   Patient understands the diabetes disease and treatment process. Needs Instruction   Patient understands incorporating nutritional management into lifestyle. Needs Instruction   Patient undertands incorporating physical activity into lifestyle. Needs Instruction   Patient understands using medications safely. Needs  Instruction   Patient understands monitoring blood glucose, interpreting and using results Needs Instruction   Patient understands prevention, detection, and treatment of acute complications. Needs Instruction   Patient understands prevention, detection, and treatment of chronic complications. Needs Instruction   Patient understands how to develop strategies to address psychosocial issues. Needs Instruction   Patient understands how to develop strategies to promote health/change behavior. Needs Instruction   Complications   Last HgB A1C per patient/outside source 8.5 %   How often do you check your blood sugar? 3-4 times/day   Fasting Blood glucose range (mg/dL) 70-129   Postprandial Blood glucose range (mg/dL) 70-129   Have you had a dilated eye exam in the past 12 months? Yes   Have you had a dental exam in the past 12 months? Yes   Are you checking your feet? Yes   How many days per week are you checking your feet? 3   Dietary Intake   Breakfast applesauce, yogurt, 2 eggs, sausage, oatmeal    Snack (morning) peanutbutter and jelly sandwich   Lunch tunafish sandwich, mac n cheese   Snack (afternoon) deviled egg sandwich   Dinner chicken wings, mashed poatoes, vegetables   Snack (evening) ice cream   Beverage(s) tang, water, lemonade   Exercise   Exercise Type ADL's   Patient Education   Previous Diabetes Education No   Nutrition management  Role of diet in the treatment of diabetes and the relationship between the three main macronutrients and blood glucose level;Food label reading, portion sizes and measuring food.;Carbohydrate counting;Reviewed  blood glucose goals for pre and post meals and how to evaluate the patients' food intake on their blood glucose level.;Meal timing in regards to the patients' current diabetes medication.   Physical activity and exercise  Role of exercise on diabetes management, blood pressure control and cardiac health.;Identified with patient nutritional  and/or medication changes necessary with exercise.   Monitoring Taught/evaluated SMBG meter.;Purpose and frequency of SMBG.;Interpreting lab values - A1C, lipid, urine microalbumina.;Yearly dilated eye exam;Daily foot exams;Identified appropriate SMBG and/or A1C goals.   Individualized Goals (developed by patient)   Nutrition Follow meal plan discussed;Adjust meds/carbs with exercise as discussed   Physical Activity Exercise 5-7 days per week;15 minutes per day   Medications take my medication as prescribed   Monitoring  test my blood glucose as discussed;test blood glucose pre and post meals as discussed   Post-Education Assessment   Patient understands the diabetes disease and treatment process. Demonstrates understanding / competency   Patient understands incorporating nutritional management into lifestyle. Demonstrates understanding / competency   Patient undertands incorporating physical activity into lifestyle. Demonstrates understanding / competency   Patient understands using medications safely. Demonstrates understanding / competency   Patient understands monitoring blood glucose, interpreting and using results Demonstrates understanding / competency   Patient understands prevention, detection, and treatment of acute complications. Demonstrates understanding / competency   Patient understands prevention, detection, and treatment of chronic complications. Demonstrates understanding / competency   Patient understands how to develop strategies to address psychosocial issues. Demonstrates understanding / competency   Patient understands how to develop strategies to promote health/change behavior. Demonstrates understanding / competency   Outcomes   Expected Outcomes Demonstrated interest in learning. Expect positive outcomes   Future DMSE PRN   Program Status Completed      Individualized Plan for Diabetes Self-Management Training:   Learning Objective:  Patient will have a greater  understanding of diabetes self-management. Patient education plan is to attend individual and/or group sessions per assessed needs and concerns.   Plan:   Patient Instructions  -Try to walk a half of a block most days of the week or around the church parking lot -Eat three meals a day with snacks in between -A meal: carbohydrate, protein, vegetable -A snack: Carbohydrate and Protein -A snack: Carbohydrate and Protein -For breakfast: one serving size of cereal, just enough milk to cover it, 6 ounce glass of milk, one slice of toast, one egg, one slice of bacon and a cup of coffee -cut your sandwiches in half for snack -2 hours after you eat you want your blood sugar to be less than 180 -Before you eat you want your blood sugar to be under 130 but above 70 -Drink juice if your blood sugar is below 70   Expected Outcomes:  Demonstrated interest in learning. Expect positive outcomes  Education material provided: Living Well with Diabetes, Meal plan card, My Plate and Snack sheet  If problems or questions, patient to contact team via:  Phone  Future DSME appointment: PRN

## 2015-08-04 ENCOUNTER — Encounter (HOSPITAL_COMMUNITY)
Admission: RE | Admit: 2015-08-04 | Discharge: 2015-08-04 | Disposition: A | Discharge status: Home / Self Care | Payer: Medicare Other | Source: Ambulatory Visit | Attending: Nephrology | Admitting: Nephrology

## 2015-08-04 DIAGNOSIS — Z029 Encounter for administrative examinations, unspecified: Secondary | ICD-10-CM | POA: Diagnosis not present

## 2015-08-04 DIAGNOSIS — N183 Chronic kidney disease, stage 3 (moderate): Secondary | ICD-10-CM | POA: Diagnosis not present

## 2015-08-04 DIAGNOSIS — D631 Anemia in chronic kidney disease: Secondary | ICD-10-CM | POA: Diagnosis not present

## 2015-08-04 LAB — POCT HEMOGLOBIN-HEMACUE: HEMOGLOBIN: 10.7 g/dL — AB (ref 12.0–15.0)

## 2015-08-04 MED ORDER — EPOETIN ALFA 20000 UNIT/ML IJ SOLN
20000.0000 [IU] | INTRAMUSCULAR | Status: DC
  Administered 2015-08-04: 20000 [IU] via SUBCUTANEOUS

## 2015-08-04 MED ORDER — EPOETIN ALFA 20000 UNIT/ML IJ SOLN
INTRAMUSCULAR | Status: AC
  Administered 2015-08-04: 20000 [IU] via SUBCUTANEOUS
  Filled 2015-08-04: qty 1

## 2015-08-11 ENCOUNTER — Encounter (HOSPITAL_COMMUNITY)
Admission: RE | Admit: 2015-08-11 | Discharge: 2015-08-11 | Disposition: A | Discharge status: Home / Self Care | Payer: Medicare Other | Source: Ambulatory Visit | Attending: Nephrology | Admitting: Nephrology

## 2015-08-11 DIAGNOSIS — D631 Anemia in chronic kidney disease: Secondary | ICD-10-CM | POA: Diagnosis not present

## 2015-08-11 DIAGNOSIS — N183 Chronic kidney disease, stage 3 (moderate): Secondary | ICD-10-CM | POA: Diagnosis not present

## 2015-08-11 LAB — CBC
HEMATOCRIT: 38.4 % (ref 36.0–46.0)
HEMOGLOBIN: 12 g/dL (ref 12.0–15.0)
MCH: 23.3 pg — AB (ref 26.0–34.0)
MCHC: 31.3 g/dL (ref 30.0–36.0)
MCV: 74.6 fL — ABNORMAL LOW (ref 78.0–100.0)
Platelets: 268 10*3/uL (ref 150–400)
RBC: 5.15 MIL/uL — AB (ref 3.87–5.11)
RDW: 17.4 % — ABNORMAL HIGH (ref 11.5–15.5)
WBC: 5 10*3/uL (ref 4.0–10.5)

## 2015-08-11 LAB — RENAL FUNCTION PANEL
ALBUMIN: 3.3 g/dL — AB (ref 3.5–5.0)
ANION GAP: 8 (ref 5–15)
BUN: 32 mg/dL — ABNORMAL HIGH (ref 6–20)
CALCIUM: 9.1 mg/dL (ref 8.9–10.3)
CO2: 29 mmol/L (ref 22–32)
Chloride: 106 mmol/L (ref 101–111)
Creatinine, Ser: 1.61 mg/dL — ABNORMAL HIGH (ref 0.44–1.00)
GFR calc non Af Amer: 31 mL/min — ABNORMAL LOW (ref >60)
GFR, EST AFRICAN AMERICAN: 36 mL/min — AB (ref >60)
Glucose, Bld: 148 mg/dL — ABNORMAL HIGH (ref 65–99)
PHOSPHORUS: 3.6 mg/dL (ref 2.5–4.6)
POTASSIUM: 3.7 mmol/L (ref 3.5–5.1)
SODIUM: 143 mmol/L (ref 135–145)

## 2015-08-11 LAB — IRON AND TIBC
Iron: 76 ug/dL (ref 28–170)
Saturation Ratios: 42 % — ABNORMAL HIGH (ref 10.4–31.8)
TIBC: 182 ug/dL — AB (ref 250–450)
UIBC: 106 ug/dL

## 2015-08-11 LAB — FERRITIN: FERRITIN: 360 ng/mL — AB (ref 11–307)

## 2015-08-11 MED ORDER — EPOETIN ALFA 20000 UNIT/ML IJ SOLN: 20000.0000 [IU] | INTRAMUSCULAR | Status: DC

## 2015-08-12 LAB — PTH, INTACT AND CALCIUM
CALCIUM TOTAL (PTH): 9.1 mg/dL (ref 8.7–10.3)
PTH: 132 pg/mL — AB (ref 15–65)

## 2015-08-14 DIAGNOSIS — I1310 Hypertensive heart and chronic kidney disease without heart failure, with stage 1 through stage 4 chronic kidney disease, or unspecified chronic kidney disease: Secondary | ICD-10-CM | POA: Diagnosis not present

## 2015-08-14 DIAGNOSIS — N183 Chronic kidney disease, stage 3 (moderate): Secondary | ICD-10-CM | POA: Diagnosis not present

## 2015-08-14 DIAGNOSIS — I2510 Atherosclerotic heart disease of native coronary artery without angina pectoris: Secondary | ICD-10-CM | POA: Diagnosis not present

## 2015-08-14 DIAGNOSIS — I5032 Chronic diastolic (congestive) heart failure: Secondary | ICD-10-CM | POA: Diagnosis not present

## 2015-08-14 DIAGNOSIS — E1122 Type 2 diabetes mellitus with diabetic chronic kidney disease: Secondary | ICD-10-CM | POA: Diagnosis not present

## 2015-08-14 DIAGNOSIS — E1143 Type 2 diabetes mellitus with diabetic autonomic (poly)neuropathy: Secondary | ICD-10-CM | POA: Diagnosis not present

## 2015-08-14 IMAGING — CR DG CHEST 2V
2 series · 2 of 2 positions shown · non-contrast
Comparison: PA and lateral chest of February 04, 2014

CLINICAL DATA: Five days of cough, congestion, and shortness of
breath, history of diabetes, coronary artery disease, COPD, and
previous history of smoking

EXAM:
CHEST  2 VIEW

[view not recorded (1 of 2)]
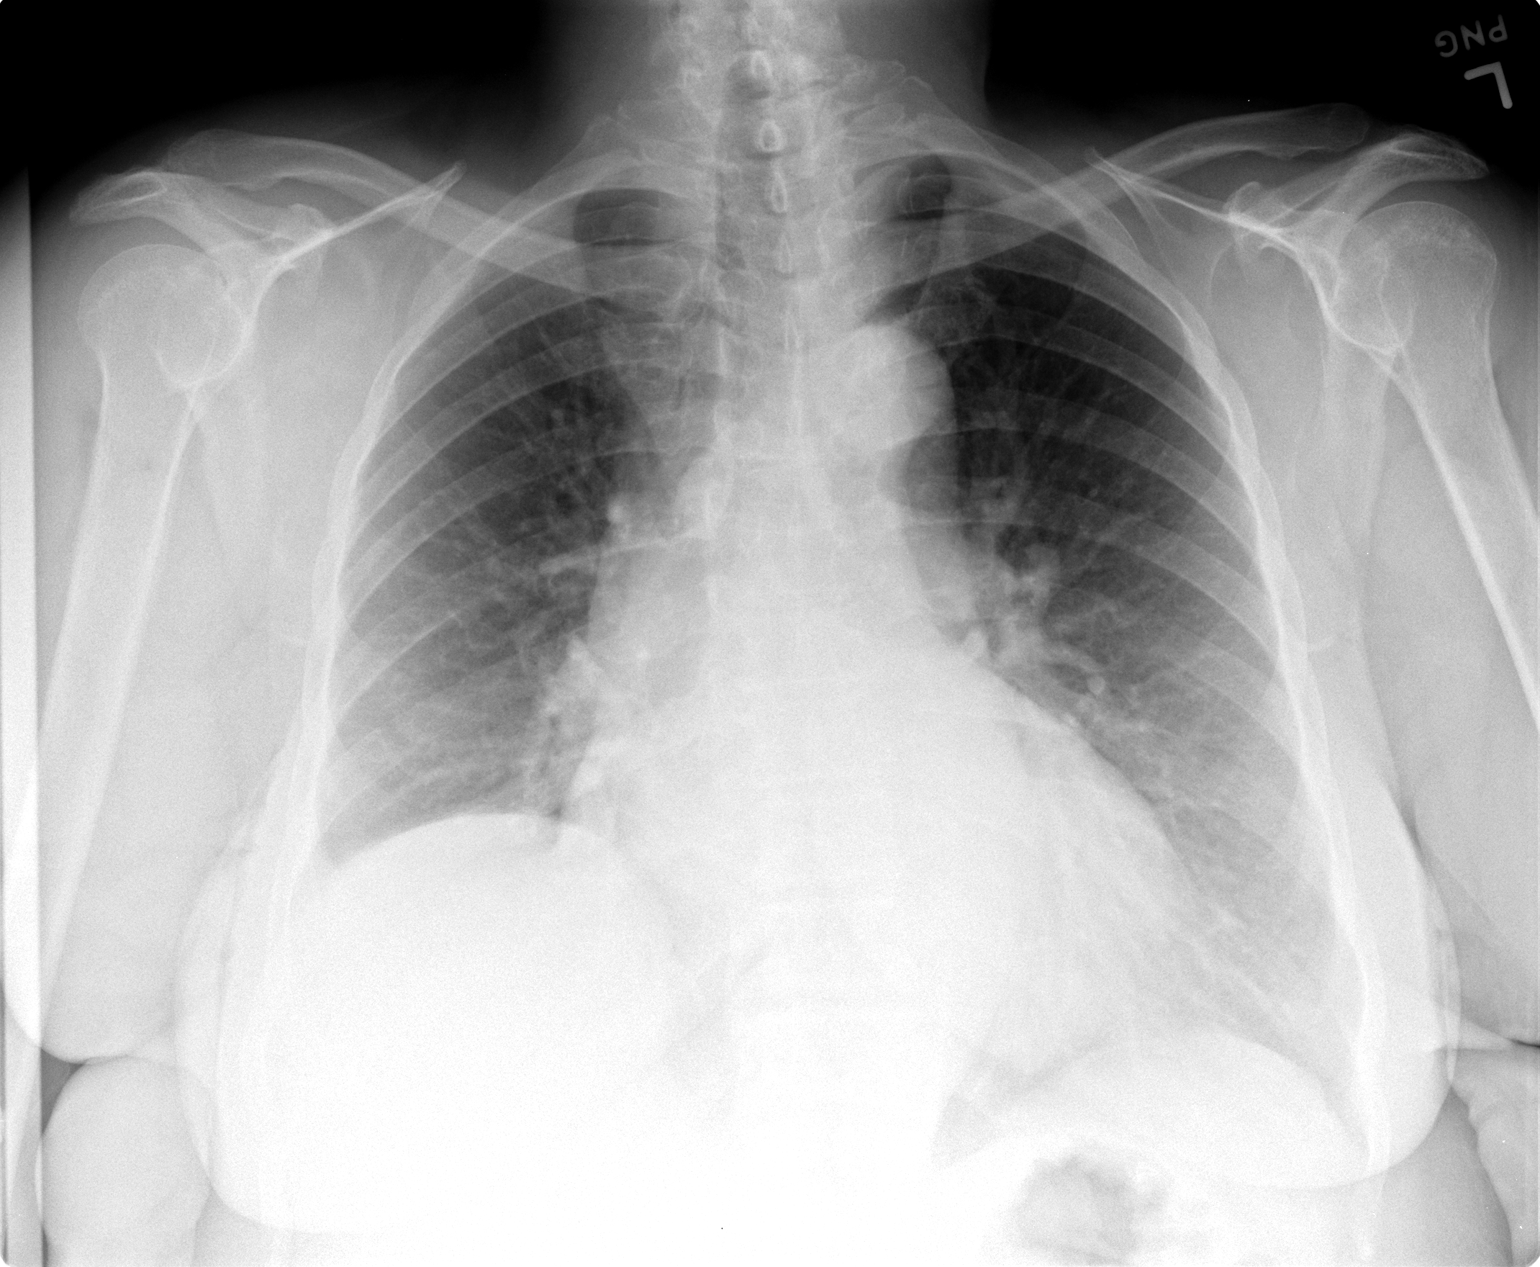

[view not recorded (2 of 2)]
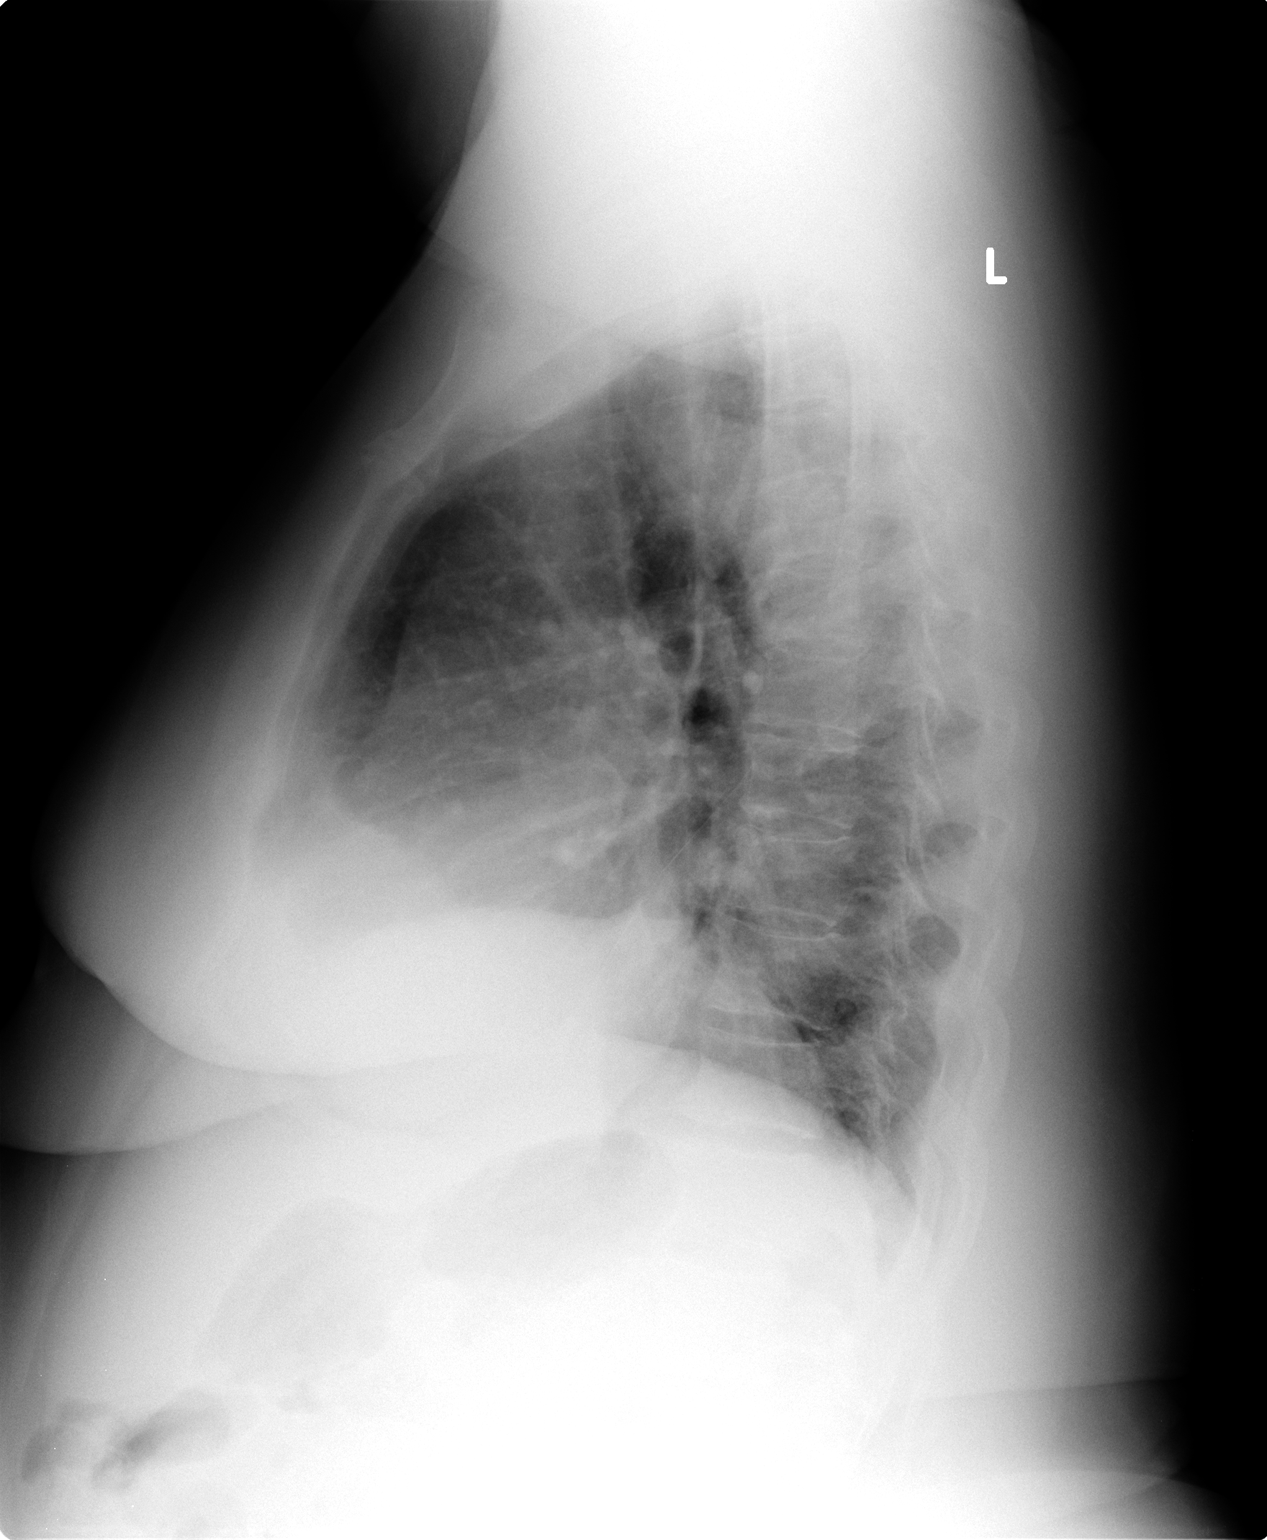

[2 of 2 positions shown; findings below may reference images not displayed]

FINDINGS: The left lung is well-expanded and clear. On the right there is
chronic elevation of the hemidiaphragm. The cardiac silhouette is
mildly enlarged but stable. The pulmonary vascularity is not
engorged. There is mild tortuosity of the descending thoracic aorta.
There is no pleural effusion. The bony thorax exhibits no acute
abnormality.
IMPRESSION: There is no pneumonia nor pulmonary edema. There is mild stable
cardiomegaly and chronic elevation of the right hemidiaphragm.

## 2015-08-15 DIAGNOSIS — M9080 Osteopathy in diseases classified elsewhere, unspecified site: Secondary | ICD-10-CM | POA: Diagnosis not present

## 2015-08-15 DIAGNOSIS — Z6841 Body Mass Index (BMI) 40.0 and over, adult: Secondary | ICD-10-CM | POA: Diagnosis not present

## 2015-08-15 DIAGNOSIS — D638 Anemia in other chronic diseases classified elsewhere: Secondary | ICD-10-CM | POA: Diagnosis not present

## 2015-08-15 DIAGNOSIS — E889 Metabolic disorder, unspecified: Secondary | ICD-10-CM | POA: Diagnosis not present

## 2015-08-15 DIAGNOSIS — I10 Essential (primary) hypertension: Secondary | ICD-10-CM | POA: Diagnosis not present

## 2015-08-15 DIAGNOSIS — N184 Chronic kidney disease, stage 4 (severe): Secondary | ICD-10-CM | POA: Diagnosis not present

## 2015-08-17 ENCOUNTER — Other Ambulatory Visit: Payer: Self-pay | Admitting: Nephrology

## 2015-08-17 ENCOUNTER — Encounter (HOSPITAL_COMMUNITY): Payer: Medicare Other

## 2015-08-17 DIAGNOSIS — D638 Anemia in other chronic diseases classified elsewhere: Secondary | ICD-10-CM | POA: Insufficient documentation

## 2015-08-18 ENCOUNTER — Encounter (HOSPITAL_COMMUNITY): Payer: Medicare Other

## 2015-08-21 DIAGNOSIS — N183 Chronic kidney disease, stage 3 (moderate): Secondary | ICD-10-CM | POA: Diagnosis not present

## 2015-08-21 DIAGNOSIS — I1310 Hypertensive heart and chronic kidney disease without heart failure, with stage 1 through stage 4 chronic kidney disease, or unspecified chronic kidney disease: Secondary | ICD-10-CM | POA: Diagnosis not present

## 2015-08-21 DIAGNOSIS — E1143 Type 2 diabetes mellitus with diabetic autonomic (poly)neuropathy: Secondary | ICD-10-CM | POA: Diagnosis not present

## 2015-08-21 DIAGNOSIS — I5032 Chronic diastolic (congestive) heart failure: Secondary | ICD-10-CM | POA: Diagnosis not present

## 2015-08-21 DIAGNOSIS — I2510 Atherosclerotic heart disease of native coronary artery without angina pectoris: Secondary | ICD-10-CM | POA: Diagnosis not present

## 2015-08-21 DIAGNOSIS — E1122 Type 2 diabetes mellitus with diabetic chronic kidney disease: Secondary | ICD-10-CM | POA: Diagnosis not present

## 2015-08-22 ENCOUNTER — Telehealth: Payer: Self-pay | Admitting: *Deleted

## 2015-08-22 NOTE — Telephone Encounter (Signed)
Unable to reach patient will try again later 

## 2015-08-22 NOTE — Telephone Encounter (Signed)
-----   Message from Hulan Saas, RN sent at 07/10/2015  9:29 AM EDT ----- Call and remind patient to stop Omeprazole X 2 weeks then have H. Pylori stool test. Lab in [facility] for 09/04/15.

## 2015-08-23 ENCOUNTER — Encounter: Payer: Self-pay | Admitting: *Deleted

## 2015-08-23 NOTE — Telephone Encounter (Signed)
Unable to reach patient at either number. No voice mail. Mailed a letter requesting she call to discuss lab test that Dr. Havery Moros is recommending.(h. Pylori stool)

## 2015-08-24 ENCOUNTER — Other Ambulatory Visit (HOSPITAL_COMMUNITY): Payer: Self-pay | Admitting: *Deleted

## 2015-08-25 ENCOUNTER — Encounter (HOSPITAL_COMMUNITY)
Admission: RE | Admit: 2015-08-25 | Discharge: 2015-08-25 | Disposition: A | Discharge status: Home / Self Care | Payer: Medicare Other | Source: Ambulatory Visit | Attending: Nephrology | Admitting: Nephrology

## 2015-08-25 DIAGNOSIS — D631 Anemia in chronic kidney disease: Secondary | ICD-10-CM | POA: Insufficient documentation

## 2015-08-25 DIAGNOSIS — N183 Chronic kidney disease, stage 3 unspecified: Secondary | ICD-10-CM

## 2015-08-25 LAB — POCT HEMOGLOBIN-HEMACUE: HEMOGLOBIN: 11.4 g/dL — AB (ref 12.0–15.0)

## 2015-08-25 MED ORDER — EPOETIN ALFA 20000 UNIT/ML IJ SOLN
20000.0000 [IU] | INTRAMUSCULAR | Status: DC
  Administered 2015-08-25: 20000 [IU] via SUBCUTANEOUS

## 2015-08-25 MED ORDER — EPOETIN ALFA 20000 UNIT/ML IJ SOLN
INTRAMUSCULAR | Status: AC
  Administered 2015-08-25: 20000 [IU] via SUBCUTANEOUS
  Filled 2015-08-25: qty 1

## 2015-08-28 ENCOUNTER — Telehealth: Payer: Self-pay | Admitting: Gastroenterology

## 2015-08-28 DIAGNOSIS — E1143 Type 2 diabetes mellitus with diabetic autonomic (poly)neuropathy: Secondary | ICD-10-CM | POA: Diagnosis not present

## 2015-08-28 DIAGNOSIS — I2510 Atherosclerotic heart disease of native coronary artery without angina pectoris: Secondary | ICD-10-CM | POA: Diagnosis not present

## 2015-08-28 DIAGNOSIS — I1310 Hypertensive heart and chronic kidney disease without heart failure, with stage 1 through stage 4 chronic kidney disease, or unspecified chronic kidney disease: Secondary | ICD-10-CM | POA: Diagnosis not present

## 2015-08-28 DIAGNOSIS — N183 Chronic kidney disease, stage 3 (moderate): Secondary | ICD-10-CM | POA: Diagnosis not present

## 2015-08-28 DIAGNOSIS — I5032 Chronic diastolic (congestive) heart failure: Secondary | ICD-10-CM | POA: Diagnosis not present

## 2015-08-28 DIAGNOSIS — E1122 Type 2 diabetes mellitus with diabetic chronic kidney disease: Secondary | ICD-10-CM | POA: Diagnosis not present

## 2015-08-28 NOTE — Telephone Encounter (Signed)
Spoke with patient and requested she stop Omeprazole x 2 weeks then come in on 09/11/15 to pick up stool kit for h. Pylori. She will do this. Lab in [facility].

## 2015-08-31 DIAGNOSIS — E1121 Type 2 diabetes mellitus with diabetic nephropathy: Secondary | ICD-10-CM | POA: Diagnosis not present

## 2015-08-31 DIAGNOSIS — Z794 Long term (current) use of insulin: Secondary | ICD-10-CM | POA: Diagnosis not present

## 2015-08-31 DIAGNOSIS — E7800 Pure hypercholesterolemia, unspecified: Secondary | ICD-10-CM | POA: Diagnosis not present

## 2015-08-31 DIAGNOSIS — E1322 Other specified diabetes mellitus with diabetic chronic kidney disease: Secondary | ICD-10-CM | POA: Diagnosis not present

## 2015-08-31 DIAGNOSIS — N183 Chronic kidney disease, stage 3 (moderate): Secondary | ICD-10-CM | POA: Diagnosis not present

## 2015-08-31 DIAGNOSIS — I10 Essential (primary) hypertension: Secondary | ICD-10-CM | POA: Diagnosis not present

## 2015-08-31 DIAGNOSIS — D638 Anemia in other chronic diseases classified elsewhere: Secondary | ICD-10-CM | POA: Diagnosis not present

## 2015-09-01 ENCOUNTER — Encounter (HOSPITAL_COMMUNITY)
Admission: RE | Admit: 2015-09-01 | Discharge: 2015-09-01 | Disposition: A | Discharge status: Home / Self Care | Payer: Medicare Other | Source: Ambulatory Visit | Attending: Nephrology | Admitting: Nephrology

## 2015-09-01 DIAGNOSIS — N183 Chronic kidney disease, stage 3 unspecified: Secondary | ICD-10-CM

## 2015-09-01 DIAGNOSIS — D631 Anemia in chronic kidney disease: Secondary | ICD-10-CM | POA: Diagnosis not present

## 2015-09-01 LAB — POCT HEMOGLOBIN-HEMACUE: Hemoglobin: 10.8 g/dL — ABNORMAL LOW (ref 12.0–15.0)

## 2015-09-01 MED ORDER — EPOETIN ALFA 20000 UNIT/ML IJ SOLN
INTRAMUSCULAR | Status: AC
  Filled 2015-09-01: qty 1

## 2015-09-01 MED ORDER — EPOETIN ALFA 20000 UNIT/ML IJ SOLN
20000.0000 [IU] | INTRAMUSCULAR | Status: DC
Start: 2015-09-01 — End: 2015-09-02
  Administered 2015-09-01: 20000 [IU] via SUBCUTANEOUS

## 2015-09-08 ENCOUNTER — Encounter (HOSPITAL_COMMUNITY)
Admission: RE | Admit: 2015-09-08 | Discharge: 2015-09-08 | Disposition: A | Discharge status: Home / Self Care | Payer: Medicare Other | Source: Ambulatory Visit | Attending: Nephrology | Admitting: Nephrology

## 2015-09-08 DIAGNOSIS — N183 Chronic kidney disease, stage 3 unspecified: Secondary | ICD-10-CM

## 2015-09-08 DIAGNOSIS — D631 Anemia in chronic kidney disease: Secondary | ICD-10-CM | POA: Diagnosis not present

## 2015-09-08 LAB — RENAL FUNCTION PANEL
Albumin: 3.4 g/dL — ABNORMAL LOW (ref 3.5–5.0)
Anion gap: 7 (ref 5–15)
BUN: 60 mg/dL — AB (ref 6–20)
CHLORIDE: 103 mmol/L (ref 101–111)
CO2: 27 mmol/L (ref 22–32)
CREATININE: 2.58 mg/dL — AB (ref 0.44–1.00)
Calcium: 8.8 mg/dL — ABNORMAL LOW (ref 8.9–10.3)
GFR calc Af Amer: 21 mL/min — ABNORMAL LOW (ref >60)
GFR calc non Af Amer: 18 mL/min — ABNORMAL LOW (ref >60)
GLUCOSE: 113 mg/dL — AB (ref 65–99)
POTASSIUM: 3.7 mmol/L (ref 3.5–5.1)
Phosphorus: 4.2 mg/dL (ref 2.5–4.6)
Sodium: 137 mmol/L (ref 135–145)

## 2015-09-08 LAB — IRON AND TIBC
IRON: 55 ug/dL (ref 28–170)
Saturation Ratios: 27 % (ref 10.4–31.8)
TIBC: 202 ug/dL — AB (ref 250–450)
UIBC: 147 ug/dL

## 2015-09-08 LAB — FERRITIN: FERRITIN: 448 ng/mL — AB (ref 11–307)

## 2015-09-08 LAB — POCT HEMOGLOBIN-HEMACUE: HEMOGLOBIN: 10.8 g/dL — AB (ref 12.0–15.0)

## 2015-09-08 MED ORDER — EPOETIN ALFA 20000 UNIT/ML IJ SOLN
20000.0000 [IU] | INTRAMUSCULAR | Status: DC
  Administered 2015-09-08: 20000 [IU] via SUBCUTANEOUS

## 2015-09-08 MED ORDER — EPOETIN ALFA 20000 UNIT/ML IJ SOLN
INTRAMUSCULAR | Status: AC
  Administered 2015-09-08: 20000 [IU] via SUBCUTANEOUS
  Filled 2015-09-08: qty 1

## 2015-09-09 LAB — PTH, INTACT AND CALCIUM
CALCIUM TOTAL (PTH): 8.5 mg/dL — AB (ref 8.7–10.3)
PTH: 137 pg/mL — ABNORMAL HIGH (ref 15–65)

## 2015-09-12 ENCOUNTER — Other Ambulatory Visit: Payer: Medicare Other

## 2015-09-12 DIAGNOSIS — R768 Other specified abnormal immunological findings in serum: Secondary | ICD-10-CM

## 2015-09-12 DIAGNOSIS — R760 Raised antibody titer: Secondary | ICD-10-CM | POA: Diagnosis not present

## 2015-09-14 LAB — H. PYLORI ANTIGEN, STOOL: H PYLORI AG STL: POSITIVE — AB

## 2015-09-15 ENCOUNTER — Encounter (HOSPITAL_COMMUNITY)
Admission: RE | Admit: 2015-09-15 | Discharge: 2015-09-15 | Disposition: A | Discharge status: Home / Self Care | Payer: Medicare Other | Source: Ambulatory Visit | Attending: Nephrology | Admitting: Nephrology

## 2015-09-15 DIAGNOSIS — N183 Chronic kidney disease, stage 3 unspecified: Secondary | ICD-10-CM

## 2015-09-15 DIAGNOSIS — D631 Anemia in chronic kidney disease: Secondary | ICD-10-CM | POA: Diagnosis not present

## 2015-09-15 LAB — POCT HEMOGLOBIN-HEMACUE: Hemoglobin: 11.3 g/dL — ABNORMAL LOW (ref 12.0–15.0)

## 2015-09-15 MED ORDER — EPOETIN ALFA 20000 UNIT/ML IJ SOLN
20000.0000 [IU] | INTRAMUSCULAR | Status: DC
  Administered 2015-09-15: 20000 [IU] via SUBCUTANEOUS

## 2015-09-15 MED ORDER — EPOETIN ALFA 20000 UNIT/ML IJ SOLN
INTRAMUSCULAR | Status: AC
  Filled 2015-09-15: qty 1

## 2015-09-18 ENCOUNTER — Other Ambulatory Visit: Payer: Self-pay

## 2015-09-18 ENCOUNTER — Telehealth: Payer: Self-pay | Admitting: Gastroenterology

## 2015-09-18 MED ORDER — AMOXICILLIN 500 MG PO CAPS: 500.0000 mg | ORAL_CAPSULE | Freq: Two times a day (BID) | ORAL | 0 refills | Status: AC

## 2015-09-18 MED ORDER — CLARITHROMYCIN 500 MG PO TABS: 500.0000 mg | ORAL_TABLET | Freq: Two times a day (BID) | ORAL | 0 refills | Status: DC

## 2015-09-18 MED ORDER — OMEPRAZOLE 20 MG PO CPDR: 20.0000 mg | DELAYED_RELEASE_CAPSULE | Freq: Two times a day (BID) | ORAL | 0 refills | Status: DC

## 2015-09-18 MED ORDER — METRONIDAZOLE 500 MG PO TABS: 500.0000 mg | ORAL_TABLET | Freq: Two times a day (BID) | ORAL | 0 refills | Status: DC

## 2015-09-19 NOTE — Telephone Encounter (Signed)
[facility] contacted. Amoxicillin changed to 1 gram BID. They will re-enforce teaching to stop Atorvastatin while on Biaxin. Written instructions sent to the patient through My Chart and the mail.

## 2015-09-21 ENCOUNTER — Other Ambulatory Visit (HOSPITAL_COMMUNITY): Payer: Self-pay | Admitting: *Deleted

## 2015-09-22 ENCOUNTER — Encounter (HOSPITAL_COMMUNITY)
Admission: RE | Admit: 2015-09-22 | Discharge: 2015-09-22 | Disposition: A | Discharge status: Home / Self Care | Payer: Medicare Other | Source: Ambulatory Visit | Attending: Nephrology | Admitting: Nephrology

## 2015-09-22 DIAGNOSIS — D631 Anemia in chronic kidney disease: Secondary | ICD-10-CM | POA: Diagnosis not present

## 2015-09-22 DIAGNOSIS — N183 Chronic kidney disease, stage 3 unspecified: Secondary | ICD-10-CM

## 2015-09-22 LAB — POCT HEMOGLOBIN-HEMACUE: HEMOGLOBIN: 11 g/dL — AB (ref 12.0–15.0)

## 2015-09-22 MED ORDER — EPOETIN ALFA 20000 UNIT/ML IJ SOLN
INTRAMUSCULAR | Status: AC
  Filled 2015-09-22: qty 1

## 2015-09-22 MED ORDER — EPOETIN ALFA 20000 UNIT/ML IJ SOLN
20000.0000 [IU] | INTRAMUSCULAR | Status: DC
  Administered 2015-09-22: 20000 [IU] via SUBCUTANEOUS

## 2015-09-29 ENCOUNTER — Encounter (HOSPITAL_COMMUNITY)
Admission: RE | Admit: 2015-09-29 | Discharge: 2015-09-29 | Disposition: A | Discharge status: Home / Self Care | Payer: Medicare Other | Source: Ambulatory Visit | Attending: Nephrology | Admitting: Nephrology

## 2015-09-29 DIAGNOSIS — D631 Anemia in chronic kidney disease: Secondary | ICD-10-CM | POA: Diagnosis not present

## 2015-09-29 DIAGNOSIS — N183 Chronic kidney disease, stage 3 unspecified: Secondary | ICD-10-CM

## 2015-09-29 LAB — POCT HEMOGLOBIN-HEMACUE: HEMOGLOBIN: 11.7 g/dL — AB (ref 12.0–15.0)

## 2015-09-29 MED ORDER — EPOETIN ALFA 20000 UNIT/ML IJ SOLN: 20000.0000 [IU] | INTRAMUSCULAR | Status: DC

## 2015-10-04 ENCOUNTER — Other Ambulatory Visit: Payer: Self-pay | Admitting: Cardiology

## 2015-10-04 ENCOUNTER — Other Ambulatory Visit: Payer: Self-pay | Admitting: Adult Health

## 2015-10-04 ENCOUNTER — Telehealth: Payer: Self-pay | Admitting: Gastroenterology

## 2015-10-04 DIAGNOSIS — A048 Other specified bacterial intestinal infections: Secondary | ICD-10-CM

## 2015-10-04 NOTE — Telephone Encounter (Signed)
Dr Havery Moros the pt completed a second round of H pylori treatment yesterday.  Does she need another H pylori stool to confirm eradication?

## 2015-10-04 NOTE — Telephone Encounter (Signed)
Yes, she does need eradication testing with stool testing. This should be done in about a month, and she needs to be off PPI for 2 weeks prior to submitting if you can please let her know. Thanks

## 2015-10-05 ENCOUNTER — Telehealth: Payer: Self-pay | Admitting: Gastroenterology

## 2015-10-05 NOTE — Telephone Encounter (Signed)
Patient notified of the recommendations She verbalized understanding to stop her omeprazole on 10/23/15 and come for lab work the week of 11/06/15

## 2015-10-05 NOTE — Telephone Encounter (Signed)
See phone note from 10/04/15 for additional details

## 2015-10-06 ENCOUNTER — Encounter (HOSPITAL_COMMUNITY): Payer: Medicare Other

## 2015-10-12 ENCOUNTER — Encounter: Payer: Self-pay | Admitting: Podiatry

## 2015-10-12 ENCOUNTER — Ambulatory Visit (INDEPENDENT_AMBULATORY_CARE_PROVIDER_SITE_OTHER): Payer: Medicare Other | Admitting: Podiatry

## 2015-10-12 DIAGNOSIS — M79673 Pain in unspecified foot: Secondary | ICD-10-CM

## 2015-10-12 DIAGNOSIS — B351 Tinea unguium: Secondary | ICD-10-CM | POA: Diagnosis not present

## 2015-10-12 NOTE — Progress Notes (Signed)
Subjective: 70 y.o. year old IDDM female patient presents requesting toe nails trimmed. Toes hurt with thick nails.  Stated that left great toe healed well since her incident bumping the left big toe.  Stated that her blood sugar this morning was 120.  Uses cane to walk.   Objective: Dermatologic:   Hypertrophic nails x 10. Thick dystrophic with horizontal ridges going across at mid point of left great toe. No plantar lesions, no abnormal skin lesions noted.  Normal color and no broken skin on injured toe left hallux.  Vascular:  All pedal pulses are palpable bilateral.  Enlarged ankle joint without active edema. Orthopedic:  Mild hallux valgus with bunion on left foot.  Excess first ray sagittal plane motion R>L.  Tight achilles tendon on both feet. Not symptomatic. Neurologic:  All epicritic and tactile sensations grossly intact.   Assessment:  Dystrophic mycotic nails x 10.  IDDM, under control.  Painful nails.   Treatment: All mycotic nails debrided.  Return in 3 months or as needed

## 2015-10-12 NOTE — Patient Instructions (Signed)
Seen for hypertrophic nails. All nails debrided. No new problems noted. Return in 3 months or as needed.  

## 2015-10-13 ENCOUNTER — Inpatient Hospital Stay (HOSPITAL_COMMUNITY): Admission: RE | Admit: 2015-10-13 | Payer: Medicare Other | Source: Ambulatory Visit

## 2015-10-16 ENCOUNTER — Ambulatory Visit (INDEPENDENT_AMBULATORY_CARE_PROVIDER_SITE_OTHER): Payer: Medicare Other | Admitting: Pulmonary Disease

## 2015-10-16 ENCOUNTER — Encounter: Payer: Self-pay | Admitting: Pulmonary Disease

## 2015-10-16 VITALS — BP 160/78 | HR 66 | Ht 64.5 in | Wt 274.8 lb

## 2015-10-16 DIAGNOSIS — J449 Chronic obstructive pulmonary disease, unspecified: Secondary | ICD-10-CM

## 2015-10-16 DIAGNOSIS — J45909 Unspecified asthma, uncomplicated: Secondary | ICD-10-CM | POA: Diagnosis not present

## 2015-10-16 DIAGNOSIS — J309 Allergic rhinitis, unspecified: Secondary | ICD-10-CM | POA: Diagnosis not present

## 2015-10-16 DIAGNOSIS — Z23 Encounter for immunization: Secondary | ICD-10-CM | POA: Diagnosis not present

## 2015-10-16 DIAGNOSIS — R058 Other specified cough: Secondary | ICD-10-CM

## 2015-10-16 DIAGNOSIS — R05 Cough: Secondary | ICD-10-CM

## 2015-10-16 DIAGNOSIS — G4733 Obstructive sleep apnea (adult) (pediatric): Secondary | ICD-10-CM | POA: Diagnosis not present

## 2015-10-16 NOTE — Progress Notes (Signed)
Chief Complaint  Patient presents with  . Follow-up    Pt states that she has noticed some improvement since last OV with MW - no recent asthma flares.     Sleep tests PSG 04/28/09 >> AHI 11, SpO2 low 68% ONO with CPAP and room air 10/22/12 >> Test time 10 hrs 59 min. Mean SpO2 92.4%, low SpO2 75%. Spent 16 min with SpO2 < 89%. CPAP 07/17/14 to 08/14/14 >> used on 27 of 30 nights with average 7 hrs and 48 min.  Average AHI is 4.6 with CPAP 9 cm H2O.  Pulmonary tests Spirometry 07/05/10 >> FEV1 1.30 (64%), FEV1% 80 >> coughing during test RAST 12/11/12 >> negative, IgE 63.3 PFT 03/17/13 >> FEV1 1.27 (67%), FEV1% 84, TLC 4.06 (80%), DLCO 57%, borderline BD  Cardiac tests Lt, Rt Heart cath 03/03/13 >> RA 10, RV 38/7, PA 41/16 28, PCWP 13, LV 142/5, AO 137/76 Echo 05/23/15 >> EF 60 to 65%, mod LVH, grade 1 DD, mod TR, PAS 59 mmHg  Past medical history Depression, DM, HTN, Diastolic dysfunction, HLD, CAD, CVA, GERD, Esophageal stricture, Gastroparesis, Hiatal hernia, Sickle cell trait, CKD  Past surgical history, Family history, Social history, Allergies reviewed  Vital Signs BP (!) 160/78 (BP Location: Right Arm, Cuff Size: Normal)   Pulse 66   Ht 5' 4.5" (1.638 m)   Wt 274 lb 12.8 oz (124.6 kg)   SpO2 95%   BMI 46.44 kg/m   History of Present Illness: Joann Gutierrez is a 70 y.o. 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  Her breathing has been okay.  Not much issue with sinuses, post nasal drip, or cough.  She is using CPAP nightly with oxygen.  Still has some leg swelling.  Physical Exam:  General - No distress ENT - no sinus tenderness, no oral exudate, no LAN Cardiac - s1s2 regular, no murmur Chest - no wheeze/rales Back - No focal tenderness Abd - Soft, non-tender Ext - 1+ ankle edema Neuro - Normal strength Skin - No rashes Psych - normal mood, and behavior   Assessment/Plan:  Upper airway cough syndrome  with post nasal drip. - continue flonase  COPD with asthma. 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 - continue 2 liters oxygen at night  Obstructive sleep apnea. - She is compliant with CPAP and reports benefit. - continue CPAP 9 cm H2O  Obesity. - discussed importance of weight loss   Patient Instructions  Influenza and Prevnar vaccines today  Follow up in 6 months    [name], MD [facility] Pager:  [phone] 10/16/2015, 11:17 AM

## 2015-10-16 NOTE — Addendum Note (Signed)
Addended by: Virl Cagey on: 10/16/2015 12:36 PM   Modules accepted: Orders

## 2015-10-16 NOTE — Patient Instructions (Signed)
Influenza and Prevnar vaccines today  Follow up in 6 months

## 2015-10-20 ENCOUNTER — Encounter (HOSPITAL_COMMUNITY)
Admission: RE | Admit: 2015-10-20 | Discharge: 2015-10-20 | Disposition: A | Discharge status: Home / Self Care | Payer: Medicare Other | Source: Ambulatory Visit | Attending: Nephrology | Admitting: Nephrology

## 2015-10-20 DIAGNOSIS — N183 Chronic kidney disease, stage 3 unspecified: Secondary | ICD-10-CM

## 2015-10-20 DIAGNOSIS — D631 Anemia in chronic kidney disease: Secondary | ICD-10-CM | POA: Diagnosis not present

## 2015-10-20 LAB — RENAL FUNCTION PANEL
ANION GAP: 8 (ref 5–15)
Albumin: 3.5 g/dL (ref 3.5–5.0)
BUN: 49 mg/dL — AB (ref 6–20)
CHLORIDE: 106 mmol/L (ref 101–111)
CO2: 27 mmol/L (ref 22–32)
Calcium: 8.7 mg/dL — ABNORMAL LOW (ref 8.9–10.3)
Creatinine, Ser: 2.41 mg/dL — ABNORMAL HIGH (ref 0.44–1.00)
GFR calc Af Amer: 22 mL/min — ABNORMAL LOW (ref >60)
GFR calc non Af Amer: 19 mL/min — ABNORMAL LOW (ref >60)
GLUCOSE: 82 mg/dL (ref 65–99)
POTASSIUM: 3.7 mmol/L (ref 3.5–5.1)
Phosphorus: 4 mg/dL (ref 2.5–4.6)
Sodium: 141 mmol/L (ref 135–145)

## 2015-10-20 LAB — IRON AND TIBC
Iron: 92 ug/dL (ref 28–170)
SATURATION RATIOS: 48 % — AB (ref 10.4–31.8)
TIBC: 192 ug/dL — AB (ref 250–450)
UIBC: 100 ug/dL

## 2015-10-20 LAB — FERRITIN: Ferritin: 395 ng/mL — ABNORMAL HIGH (ref 11–307)

## 2015-10-20 LAB — POCT HEMOGLOBIN-HEMACUE: Hemoglobin: 10.3 g/dL — ABNORMAL LOW (ref 12.0–15.0)

## 2015-10-20 MED ORDER — EPOETIN ALFA 20000 UNIT/ML IJ SOLN
INTRAMUSCULAR | Status: AC
  Filled 2015-10-20: qty 1

## 2015-10-20 MED ORDER — EPOETIN ALFA 20000 UNIT/ML IJ SOLN
20000.0000 [IU] | INTRAMUSCULAR | Status: DC
  Administered 2015-10-20: 20000 [IU] via SUBCUTANEOUS

## 2015-10-21 LAB — PTH, INTACT AND CALCIUM
Calcium, Total (PTH): 8.7 mg/dL (ref 8.7–10.3)
PTH: 121 pg/mL — ABNORMAL HIGH (ref 15–65)

## 2015-10-27 ENCOUNTER — Encounter (HOSPITAL_COMMUNITY)
Admission: RE | Admit: 2015-10-27 | Discharge: 2015-10-27 | Disposition: A | Discharge status: Home / Self Care | Payer: Medicare Other | Source: Ambulatory Visit | Attending: Nephrology | Admitting: Nephrology

## 2015-10-27 DIAGNOSIS — N183 Chronic kidney disease, stage 3 unspecified: Secondary | ICD-10-CM

## 2015-10-27 DIAGNOSIS — D631 Anemia in chronic kidney disease: Secondary | ICD-10-CM | POA: Insufficient documentation

## 2015-10-27 LAB — POCT HEMOGLOBIN-HEMACUE: HEMOGLOBIN: 10.2 g/dL — AB (ref 12.0–15.0)

## 2015-10-27 MED ORDER — EPOETIN ALFA 20000 UNIT/ML IJ SOLN
INTRAMUSCULAR | Status: AC
  Filled 2015-10-27: qty 1

## 2015-10-27 MED ORDER — EPOETIN ALFA 20000 UNIT/ML IJ SOLN
20000.0000 [IU] | INTRAMUSCULAR | Status: DC
  Administered 2015-10-27: 20000 [IU] via SUBCUTANEOUS

## 2015-11-03 ENCOUNTER — Encounter (HOSPITAL_COMMUNITY)
Admission: RE | Admit: 2015-11-03 | Discharge: 2015-11-03 | Disposition: A | Discharge status: Home / Self Care | Payer: Medicare Other | Source: Ambulatory Visit | Attending: Nephrology | Admitting: Nephrology

## 2015-11-03 ENCOUNTER — Other Ambulatory Visit: Payer: Medicare Other

## 2015-11-03 ENCOUNTER — Other Ambulatory Visit: Payer: Self-pay | Admitting: Internal Medicine

## 2015-11-03 DIAGNOSIS — N183 Chronic kidney disease, stage 3 unspecified: Secondary | ICD-10-CM

## 2015-11-03 DIAGNOSIS — D631 Anemia in chronic kidney disease: Secondary | ICD-10-CM | POA: Diagnosis not present

## 2015-11-03 LAB — POCT HEMOGLOBIN-HEMACUE: HEMOGLOBIN: 10.7 g/dL — AB (ref 12.0–15.0)

## 2015-11-03 MED ORDER — EPOETIN ALFA 20000 UNIT/ML IJ SOLN
20000.0000 [IU] | INTRAMUSCULAR | Status: DC
  Administered 2015-11-03: 20000 [IU] via SUBCUTANEOUS

## 2015-11-03 MED ORDER — EPOETIN ALFA 20000 UNIT/ML IJ SOLN
INTRAMUSCULAR | Status: AC
  Administered 2015-11-03: 20000 [IU] via SUBCUTANEOUS
  Filled 2015-11-03: qty 1

## 2015-11-06 ENCOUNTER — Other Ambulatory Visit: Payer: Medicare Other

## 2015-11-06 DIAGNOSIS — A048 Other specified bacterial intestinal infections: Secondary | ICD-10-CM

## 2015-11-07 LAB — HELICOBACTER PYLORI  SPECIAL ANTIGEN: H. PYLORI ANTIGEN STOOL: NOT DETECTED

## 2015-11-10 ENCOUNTER — Encounter (HOSPITAL_COMMUNITY)
Admission: RE | Admit: 2015-11-10 | Discharge: 2015-11-10 | Disposition: A | Discharge status: Home / Self Care | Payer: Medicare Other | Source: Ambulatory Visit | Attending: Nephrology | Admitting: Nephrology

## 2015-11-10 DIAGNOSIS — D631 Anemia in chronic kidney disease: Secondary | ICD-10-CM | POA: Diagnosis not present

## 2015-11-10 DIAGNOSIS — N183 Chronic kidney disease, stage 3 unspecified: Secondary | ICD-10-CM

## 2015-11-10 LAB — POCT HEMOGLOBIN-HEMACUE: Hemoglobin: 10.8 g/dL — ABNORMAL LOW (ref 12.0–15.0)

## 2015-11-10 MED ORDER — EPOETIN ALFA 20000 UNIT/ML IJ SOLN
INTRAMUSCULAR | Status: AC
  Administered 2015-11-10: 20000 [IU]
  Filled 2015-11-10: qty 1

## 2015-11-10 MED ORDER — EPOETIN ALFA 20000 UNIT/ML IJ SOLN: 20000.0000 [IU] | INTRAMUSCULAR | Status: DC

## 2015-11-12 ENCOUNTER — Encounter: Payer: Self-pay | Admitting: Gastroenterology

## 2015-11-14 NOTE — Progress Notes (Signed)
Letter mailed. jf

## 2015-11-17 ENCOUNTER — Encounter (HOSPITAL_COMMUNITY)
Admission: RE | Admit: 2015-11-17 | Discharge: 2015-11-17 | Disposition: A | Discharge status: Home / Self Care | Payer: Medicare Other | Source: Ambulatory Visit | Attending: Nephrology | Admitting: Nephrology

## 2015-11-17 DIAGNOSIS — N183 Chronic kidney disease, stage 3 unspecified: Secondary | ICD-10-CM

## 2015-11-17 DIAGNOSIS — D631 Anemia in chronic kidney disease: Secondary | ICD-10-CM | POA: Diagnosis not present

## 2015-11-17 LAB — IRON AND TIBC
Iron: 47 ug/dL (ref 28–170)
Saturation Ratios: 23 % (ref 10.4–31.8)
TIBC: 204 ug/dL — ABNORMAL LOW (ref 250–450)
UIBC: 157 ug/dL

## 2015-11-17 LAB — RENAL FUNCTION PANEL
ALBUMIN: 3.4 g/dL — AB (ref 3.5–5.0)
ANION GAP: 9 (ref 5–15)
BUN: 26 mg/dL — ABNORMAL HIGH (ref 6–20)
CO2: 29 mmol/L (ref 22–32)
Calcium: 8.7 mg/dL — ABNORMAL LOW (ref 8.9–10.3)
Chloride: 106 mmol/L (ref 101–111)
Creatinine, Ser: 1.46 mg/dL — ABNORMAL HIGH (ref 0.44–1.00)
GFR, EST AFRICAN AMERICAN: 41 mL/min — AB (ref >60)
GFR, EST NON AFRICAN AMERICAN: 35 mL/min — AB (ref >60)
Glucose, Bld: 131 mg/dL — ABNORMAL HIGH (ref 65–99)
PHOSPHORUS: 3.2 mg/dL (ref 2.5–4.6)
POTASSIUM: 3.6 mmol/L (ref 3.5–5.1)
Sodium: 144 mmol/L (ref 135–145)

## 2015-11-17 LAB — POCT HEMOGLOBIN-HEMACUE: HEMOGLOBIN: 10.5 g/dL — AB (ref 12.0–15.0)

## 2015-11-17 LAB — FERRITIN: Ferritin: 306 ng/mL (ref 11–307)

## 2015-11-17 MED ORDER — EPOETIN ALFA 20000 UNIT/ML IJ SOLN
20000.0000 [IU] | INTRAMUSCULAR | Status: DC
  Administered 2015-11-17: 20000 [IU] via SUBCUTANEOUS

## 2015-11-17 MED ORDER — EPOETIN ALFA 20000 UNIT/ML IJ SOLN
INTRAMUSCULAR | Status: AC
  Filled 2015-11-17: qty 1

## 2015-11-18 LAB — PTH, INTACT AND CALCIUM
CALCIUM TOTAL (PTH): 8.9 mg/dL (ref 8.7–10.3)
PTH: 119 pg/mL — ABNORMAL HIGH (ref 15–65)

## 2015-11-21 ENCOUNTER — Other Ambulatory Visit: Payer: Self-pay | Admitting: Adult Health

## 2015-11-21 ENCOUNTER — Other Ambulatory Visit: Payer: Self-pay | Admitting: Internal Medicine

## 2015-11-24 ENCOUNTER — Encounter (HOSPITAL_COMMUNITY)
Admission: RE | Admit: 2015-11-24 | Discharge: 2015-11-24 | Disposition: A | Discharge status: Home / Self Care | Payer: Medicare Other | Source: Ambulatory Visit | Attending: Nephrology | Admitting: Nephrology

## 2015-11-24 DIAGNOSIS — N183 Chronic kidney disease, stage 3 unspecified: Secondary | ICD-10-CM

## 2015-11-24 DIAGNOSIS — D631 Anemia in chronic kidney disease: Secondary | ICD-10-CM | POA: Diagnosis not present

## 2015-11-24 LAB — POCT HEMOGLOBIN-HEMACUE: HEMOGLOBIN: 11.8 g/dL — AB (ref 12.0–15.0)

## 2015-11-24 MED ORDER — EPOETIN ALFA 20000 UNIT/ML IJ SOLN: 20000.0000 [IU] | INTRAMUSCULAR | Status: DC

## 2015-11-27 ENCOUNTER — Emergency Department (HOSPITAL_COMMUNITY): Payer: Medicare Other

## 2015-11-27 ENCOUNTER — Inpatient Hospital Stay (HOSPITAL_COMMUNITY)
Admission: EM | Admit: 2015-11-27 | Discharge: 2015-12-04 | DRG: 595 | Disposition: A | Discharge status: Home Health | Payer: Medicare Other | Attending: Internal Medicine | Admitting: Internal Medicine

## 2015-11-27 ENCOUNTER — Encounter (HOSPITAL_COMMUNITY): Payer: Self-pay | Admitting: Emergency Medicine

## 2015-11-27 DIAGNOSIS — N289 Disorder of kidney and ureter, unspecified: Secondary | ICD-10-CM

## 2015-11-27 DIAGNOSIS — Z87891 Personal history of nicotine dependence: Secondary | ICD-10-CM

## 2015-11-27 DIAGNOSIS — D638 Anemia in other chronic diseases classified elsewhere: Secondary | ICD-10-CM | POA: Diagnosis present

## 2015-11-27 DIAGNOSIS — Z79899 Other long term (current) drug therapy: Secondary | ICD-10-CM

## 2015-11-27 DIAGNOSIS — Z6841 Body Mass Index (BMI) 40.0 and over, adult: Secondary | ICD-10-CM

## 2015-11-27 DIAGNOSIS — E1122 Type 2 diabetes mellitus with diabetic chronic kidney disease: Secondary | ICD-10-CM | POA: Diagnosis not present

## 2015-11-27 DIAGNOSIS — B029 Zoster without complications: Secondary | ICD-10-CM | POA: Diagnosis not present

## 2015-11-27 DIAGNOSIS — Z7951 Long term (current) use of inhaled steroids: Secondary | ICD-10-CM

## 2015-11-27 DIAGNOSIS — G4733 Obstructive sleep apnea (adult) (pediatric): Secondary | ICD-10-CM

## 2015-11-27 DIAGNOSIS — I10 Essential (primary) hypertension: Secondary | ICD-10-CM

## 2015-11-27 DIAGNOSIS — J4489 Other specified chronic obstructive pulmonary disease: Secondary | ICD-10-CM

## 2015-11-27 DIAGNOSIS — Z833 Family history of diabetes mellitus: Secondary | ICD-10-CM

## 2015-11-27 DIAGNOSIS — K3184 Gastroparesis: Secondary | ICD-10-CM | POA: Diagnosis not present

## 2015-11-27 DIAGNOSIS — L03211 Cellulitis of face: Secondary | ICD-10-CM | POA: Diagnosis not present

## 2015-11-27 DIAGNOSIS — L989 Disorder of the skin and subcutaneous tissue, unspecified: Secondary | ICD-10-CM | POA: Diagnosis not present

## 2015-11-27 DIAGNOSIS — T783XXA Angioneurotic edema, initial encounter: Secondary | ICD-10-CM | POA: Diagnosis present

## 2015-11-27 DIAGNOSIS — I5032 Chronic diastolic (congestive) heart failure: Secondary | ICD-10-CM | POA: Diagnosis present

## 2015-11-27 DIAGNOSIS — IMO0001 Reserved for inherently not codable concepts without codable children: Secondary | ICD-10-CM

## 2015-11-27 DIAGNOSIS — Z888 Allergy status to other drugs, medicaments and biological substances status: Secondary | ICD-10-CM

## 2015-11-27 DIAGNOSIS — N183 Chronic kidney disease, stage 3 unspecified: Secondary | ICD-10-CM | POA: Diagnosis present

## 2015-11-27 DIAGNOSIS — I161 Hypertensive emergency: Secondary | ICD-10-CM

## 2015-11-27 DIAGNOSIS — G9340 Encephalopathy, unspecified: Secondary | ICD-10-CM

## 2015-11-27 DIAGNOSIS — H269 Unspecified cataract: Secondary | ICD-10-CM | POA: Diagnosis present

## 2015-11-27 DIAGNOSIS — Z794 Long term (current) use of insulin: Secondary | ICD-10-CM

## 2015-11-27 DIAGNOSIS — Z885 Allergy status to narcotic agent status: Secondary | ICD-10-CM

## 2015-11-27 DIAGNOSIS — B028 Zoster with other complications: Secondary | ICD-10-CM | POA: Diagnosis present

## 2015-11-27 DIAGNOSIS — Z7982 Long term (current) use of aspirin: Secondary | ICD-10-CM

## 2015-11-27 DIAGNOSIS — I130 Hypertensive heart and chronic kidney disease with heart failure and stage 1 through stage 4 chronic kidney disease, or unspecified chronic kidney disease: Secondary | ICD-10-CM | POA: Diagnosis present

## 2015-11-27 DIAGNOSIS — L03213 Periorbital cellulitis: Secondary | ICD-10-CM | POA: Diagnosis present

## 2015-11-27 DIAGNOSIS — E1169 Type 2 diabetes mellitus with other specified complication: Secondary | ICD-10-CM

## 2015-11-27 DIAGNOSIS — R220 Localized swelling, mass and lump, head: Secondary | ICD-10-CM | POA: Diagnosis not present

## 2015-11-27 DIAGNOSIS — J449 Chronic obstructive pulmonary disease, unspecified: Secondary | ICD-10-CM | POA: Diagnosis present

## 2015-11-27 DIAGNOSIS — E119 Type 2 diabetes mellitus without complications: Secondary | ICD-10-CM

## 2015-11-27 DIAGNOSIS — E669 Obesity, unspecified: Secondary | ICD-10-CM

## 2015-11-27 DIAGNOSIS — E1143 Type 2 diabetes mellitus with diabetic autonomic (poly)neuropathy: Secondary | ICD-10-CM | POA: Diagnosis present

## 2015-11-27 LAB — COMPREHENSIVE METABOLIC PANEL
ALBUMIN: 3.5 g/dL (ref 3.5–5.0)
ALK PHOS: 78 U/L (ref 38–126)
ALT: 9 U/L — ABNORMAL LOW (ref 14–54)
ANION GAP: 11 (ref 5–15)
AST: 16 U/L (ref 15–41)
BUN: 27 mg/dL — ABNORMAL HIGH (ref 6–20)
CALCIUM: 8.6 mg/dL — AB (ref 8.9–10.3)
CO2: 26 mmol/L (ref 22–32)
Chloride: 104 mmol/L (ref 101–111)
Creatinine, Ser: 1.83 mg/dL — ABNORMAL HIGH (ref 0.44–1.00)
GFR calc non Af Amer: 27 mL/min — ABNORMAL LOW (ref >60)
GFR, EST AFRICAN AMERICAN: 31 mL/min — AB (ref >60)
Glucose, Bld: 195 mg/dL — ABNORMAL HIGH (ref 65–99)
POTASSIUM: 4 mmol/L (ref 3.5–5.1)
SODIUM: 141 mmol/L (ref 135–145)
TOTAL PROTEIN: 8.2 g/dL — AB (ref 6.5–8.1)
Total Bilirubin: 0.6 mg/dL (ref 0.3–1.2)

## 2015-11-27 LAB — CBC WITH DIFFERENTIAL/PLATELET
BASOS ABS: 0 10*3/uL (ref 0.0–0.1)
Basophils Relative: 0 %
EOS ABS: 0.1 10*3/uL (ref 0.0–0.7)
Eosinophils Relative: 1 %
HEMATOCRIT: 36.8 % (ref 36.0–46.0)
HEMOGLOBIN: 11.7 g/dL — AB (ref 12.0–15.0)
LYMPHS PCT: 16 %
Lymphs Abs: 1.1 10*3/uL (ref 0.7–4.0)
MCH: 23.5 pg — ABNORMAL LOW (ref 26.0–34.0)
MCHC: 31.8 g/dL (ref 30.0–36.0)
MCV: 73.9 fL — ABNORMAL LOW (ref 78.0–100.0)
MONOS PCT: 26 %
Monocytes Absolute: 1.7 10*3/uL — ABNORMAL HIGH (ref 0.1–1.0)
NEUTROS PCT: 57 %
Neutro Abs: 3.8 10*3/uL (ref 1.7–7.7)
Platelets: 157 10*3/uL (ref 150–400)
RBC: 4.98 MIL/uL (ref 3.87–5.11)
RDW: 16.2 % — ABNORMAL HIGH (ref 11.5–15.5)
WBC: 6.7 10*3/uL (ref 4.0–10.5)

## 2015-11-27 LAB — I-STAT CG4 LACTIC ACID, ED: LACTIC ACID, VENOUS: 1.13 mmol/L (ref 0.5–1.9)

## 2015-11-27 LAB — SEDIMENTATION RATE: SED RATE: 33 mm/h — AB (ref 0–22)

## 2015-11-27 MED ORDER — VANCOMYCIN HCL IN DEXTROSE 1-5 GM/200ML-% IV SOLN
1000.0000 mg | Freq: Once | INTRAVENOUS | Status: AC
  Administered 2015-11-27: 1000 mg via INTRAVENOUS
  Filled 2015-11-27: qty 200

## 2015-11-27 MED ORDER — ACETAMINOPHEN 325 MG PO TABS
650.0000 mg | ORAL_TABLET | Freq: Once | ORAL | Status: AC
  Administered 2015-11-27: 650 mg via ORAL
  Filled 2015-11-27: qty 2

## 2015-11-27 MED ORDER — OXYCODONE-ACETAMINOPHEN 5-325 MG PO TABS
1.0000 | ORAL_TABLET | Freq: Once | ORAL | Status: AC
  Administered 2015-11-27: 1 via ORAL
  Filled 2015-11-27: qty 1

## 2015-11-27 NOTE — ED Triage Notes (Signed)
Pt presents to ED for assessment of left sided facial swelling getting progressively worse.  Pt also has rash and some skin sloughing.  Pt developing an ulcer to tender skin on nose from cpap machine.  Pt had flu shot 2 weeks ago.  Pt takes lisinopril from BP.  Pt c/o tightness in her throat.

## 2015-11-27 NOTE — ED Provider Notes (Signed)
[facility] DEPT Provider Note   CSN: [id number] Arrival date & time: 11/27/15  2022     History   Chief Complaint Chief Complaint  Patient presents with  . Angioedema    HPI BENTLEIGH WAREN is a 70 y.o. female.  She has had a rash on the left side of her face for the last 3 days. It is getting worse. Rash is painful. There are some blisters which started wear her CPAP mask was pressing on her face. She was not running a fever at home. There've been no chills or sweats. She's had no unusual exposures. Family thinks that she might have an abscess on left side of her upper,   The history is provided by the patient and a relative.    Past Medical History:  Diagnosis Date  . Adenomatous colon polyp   . Allergy   . Anemia   . Asthma       . CAD (coronary artery disease)    Mild very minimal coronary disease with 20% obtuse marginal stenosis  . Carpal tunnel syndrome on left   . CHF (congestive heart failure) ([place])   . Chronic kidney disease (CKD), stage III (moderate)   . COPD (chronic obstructive pulmonary disease) ([place])   . CVA (cerebral infarction)   . Depression   . 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 Diverticulosis   . 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 Esophageal dysmotility   . Esophageal stricture   . Gastritis   . Gastroparesis 08/21/2007  . GERD (gastroesophageal reflux disease)   . Hair loss   . Hearing loss of both ears   . Hernia, hiatal   . Hyperlipidemia   . Hypertension   . Morbid obesity ([place])   . Osteoarthritis   . OSTEOARTHRITIS 08/09/2006  . Osteoporosis   . 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 RENAL INSUFFICIENCY 02/16/2009  . 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 Seizures ([place])    pt thinks it has been several monthes since she had a seisure  . Sickle cell trait ([place])   . Stroke ([place])   . Tubular adenoma of colon     Patient Active Problem List   Diagnosis Date Noted  . Anemia of chronic disease  08/17/2015  . History of colonic polyps   . Hypertensive emergency 05/20/2015  . Hypokalemia 05/20/2015  . AKI (acute kidney injury) ([place])   . Esophageal dysmotility 03/01/2015  . Throat congestion 10/11/2014  . Severe obesity (BMI >= 40) ([place]) 09/23/2014  . Acute asthmatic bronchitis 05/20/2014  . Abdominal pain, other specified site 09/13/2013  . Elevated liver enzymes 09/13/2013  . Cough 05/05/2013  . Pain in lower limb 03/05/2013  . Dyspnea 02/03/2013  . Chronic respiratory failure ([place]) 12/13/2012  . Stricture and stenosis of esophagus 08/31/2012  . Onychomycosis 06/01/2012  . Pain in joint, ankle and foot 06/01/2012  . Insulin dependent diabetes mellitus ([place]) 04/29/2012  . Hypertension 07/19/2011  . Anemia 05/19/2011  . CKD (chronic kidney disease), stage III 05/19/2011  . Family history of malignant neoplasm of gastrointestinal tract 10/30/2010  . Bloating 10/16/2010  . Epigastric pain 10/16/2010  . Foot pain 09/25/2010  . Dysphagia 07/16/2010  . GERD (gastroesophageal reflux disease) 07/16/2010  . Microcytic anemia 07/16/2010  . Obstructive sleep apnea 06/16/2009  . HYPERCHOLESTEROLEMIA 02/16/2009  . 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 Gastroparesis 08/21/2007  . DIABETES MELLITUS, TYPE II 08/09/2006  . Chronic obstructive asthma ([place]) 08/09/2006  Past Surgical History:  Procedure Laterality Date  . ABDOMINAL HYSTERECTOMY    . ARTERY BIOPSY  01/07/2011   Procedure: MINOR BIOPSY TEMPORAL ARTERY;  Surgeon: Haywood Lasso, MD;  Location: [facility];  Service: General;  Laterality: Left;  left temporal artery biopsy  . bil foot surgery    . BREAST LUMPECTOMY     benign  . BREAST LUMPECTOMY     both breast lumps removed   . COLONOSCOPY    . COLONOSCOPY WITH PROPOFOL N/A 07/05/2015   Procedure: COLONOSCOPY WITH PROPOFOL;  Surgeon: Manus Gunning, MD;  Location: [facility];  Service: Gastroenterology;  Laterality: N/A;  . ERD   08/08/2000  . ESOPHAGEAL MANOMETRY N/A 03/13/2015   Procedure: ESOPHAGEAL MANOMETRY (EM);  Surgeon: Manus Gunning, MD;  Location: [facility];  Service: Gastroenterology;  Laterality: N/A;  . ESOPHAGOGASTRODUODENOSCOPY  06/25/2006  . ESOPHAGOGASTRODUODENOSCOPY (EGD) WITH PROPOFOL N/A 07/05/2015   Procedure: ESOPHAGOGASTRODUODENOSCOPY (EGD) WITH PROPOFOL;  Surgeon: Manus Gunning, MD;  Location: [facility];  Service: Gastroenterology;  Laterality: N/A;  . HERNIA REPAIR    . LEFT AND RIGHT HEART CATHETERIZATION WITH CORONARY ANGIOGRAM N/A 03/03/2013   Procedure: LEFT AND RIGHT HEART CATHETERIZATION WITH CORONARY ANGIOGRAM;  Surgeon: Minus Breeding, MD;  Location: [facility];  Service: Cardiovascular;  Laterality: N/A;  . REPLACEMENT TOTAL KNEE Left 1998  . UPPER GASTROINTESTINAL ENDOSCOPY      OB History    No data available       Home Medications    Prior to Admission medications   Medication Sig Start Date End Date Taking? Authorizing Provider  albuterol (VENTOLIN HFA) 108 (90 BASE) MCG/ACT inhaler Inhale 2 puffs into the lungs every 6 (six) hours as needed for shortness of breath. 11/10/14   [name], MD  aspirin 325 MG tablet Take 325 mg by mouth daily as needed for mild pain.     Historical Provider, MD  aspirin EC 81 MG tablet Take 81 mg by mouth daily.    Historical Provider, MD  atorvastatin (LIPITOR) 10 MG tablet Take 10 mg by mouth daily.    Historical Provider, MD  Calcium Carbonate-Vit D-Min (CALTRATE PLUS PO) Take 600 mg by mouth every morning.     Historical Provider, MD  clarithromycin (BIAXIN) 500 MG tablet Take 1 tablet (500 mg total) by mouth 2 (two) times daily. Stop Atorvastatin while taking this medication 09/18/15   Manus Gunning, MD  [facility] Liver Oil CAPS Take 1 capsule by mouth daily.    Historical Provider, MD  cyclobenzaprine (FLEXERIL) 10 MG tablet Take 10 mg by mouth at bedtime. Reported on 02/07/2015    Historical Provider, MD    donepezil (ARICEPT) 10 MG tablet Take 10 mg by mouth at bedtime.     Historical Provider, MD  doxazosin (CARDURA) 2 MG tablet Take 1 tablet by mouth at bedtime. 10/10/14   Historical Provider, MD  ezetimibe (ZETIA) 10 MG tablet Take 10 mg by mouth at bedtime.     Historical Provider, MD  ferrous sulfate 325 (65 FE) MG EC tablet Take 325 mg by mouth every morning. Reported on 01/25/2015    Historical Provider, MD  fluticasone (FLONASE) 50 MCG/ACT nasal spray Place 2 sprays into both nostrils daily. 11/10/14 08/27/17  [name], MD  furosemide (LASIX) 80 MG tablet Take 1 tablet (80 mg total) by mouth 2 (two) times daily. 05/28/15   Hosie Poisson, MD  gabapentin (NEURONTIN) 300 MG capsule Take 1 capsule (300 mg  total) by mouth 2 (two) times daily. 04/29/11   Renato Shin, MD  hydrALAZINE (APRESOLINE) 100 MG tablet Take 100 mg by mouth 3 (three) times daily.    Historical Provider, MD  insulin glargine (LANTUS) 100 UNIT/ML injection Inject 0.15 mLs (15 Units total) into the skin daily. Patient taking differently: Inject 15-30 Units into the skin daily.  09/18/13   Maryann Mikhail, DO  insulin NPH (HUMULIN N) 100 UNIT/ML injection Inject 2-13 Units into the skin 3 (three) times daily after meals. Reported on 01/25/2015 07/07/12   Renato Shin, MD  isosorbide mononitrate (IMDUR) 120 MG 24 hr tablet Take 1 tablet (120 mg total) by mouth daily. PATIENT NEEDS TO CONTACT OFFICE FOR ADDITIONAL REFILLS 01/11/15   Minus Breeding, MD  KLOR-CON M20 20 MEQ tablet Take 1 tablet by mouth daily.  06/19/11   Historical Provider, MD  losartan (COZAAR) 100 MG tablet Take 100 mg by mouth daily.    Historical Provider, MD  metoCLOPramide (REGLAN) 10 MG tablet TAKE ONE TABLET BY MOUTH BEFORE MEALS AND AT BEDTIME 11/21/15   Tanda Rockers, MD  metolazone (ZAROXOLYN) 2.5 MG tablet Take 1 tablet (2.5 mg total) by mouth 3 (three) times a week. Take 1 tablet by mouth on Monday, Wednesday and Friday 05/24/15   Hosie Poisson, MD  metroNIDAZOLE  (FLAGYL) 500 MG tablet Take 1 tablet (500 mg total) by mouth 2 (two) times daily. 09/18/15   Manus Gunning, MD  montelukast (SINGULAIR) 10 MG tablet Take 1 tablet (10 mg total) by mouth daily. 11/10/14   [name], MD  Multiple Vitamins-Minerals (MULTIVITAMIN GUMMIES ADULT) CHEW Chew 1 capsule by mouth daily.    Historical Provider, MD  Na Sulfate-K Sulfate-Mg Sulf 17.5-3.13-1.6 GM/180ML SOLN Suprep-Use as directed 06/14/15   Manus Gunning, MD  nitroGLYCERIN (NITROSTAT) 0.4 MG SL tablet PLACE ONE TABLET UNDER THE TONGUE EVERY FIVE MINUTES AS NEEDED FOR CHEST PAIN 10/04/15   Minus Breeding, MD  Omega-3 Fatty Acids (FISH OIL) 1000 MG CAPS Take 1 capsule by mouth daily.    Historical Provider, MD  omeprazole (PRILOSEC) 20 MG capsule Take 1 capsule (20 mg total) by mouth 2 (two) times daily before a meal. 09/18/15 10/02/15  Manus Gunning, MD  OXYGEN Inhale into the lungs. CPAP with oxygen at bedtime    Historical Provider, MD  raloxifene (EVISTA) 60 MG tablet Take 60 mg by mouth every morning.     Historical Provider, MD  SYMBICORT 160-4.5 MCG/ACT inhaler INHALE 2 PUFFS INTO THE LUNGS 2 TIMES DAILY 11/23/15   Tammy S Parrett, NP  traMADol (ULTRAM) 50 MG tablet Take 50 mg by mouth 2 (two) times daily. Reported on 02/07/2015    Historical Provider, MD  Vitamin D, Ergocalciferol, (DRISDOL) 50000 UNITS CAPS capsule Take 50,000 Units by mouth every Monday.     Historical Provider, MD    Family History Family History  Problem Relation Age of Onset  . Liver cancer Mother     Liver Cancer  . Diabetes Mother   . Kidney disease Mother   . Heart disease Mother     age 81's  . Heart disease Father 22    MI  . Heart attack Father     died of MI when pt was 54  . Sickle cell anemia Father   . Colon cancer Brother   . Cancer Brother     Colon Cancer  . Diabetes Sister   . Kidney disease Sister   . Heart disease Sister  age 13's  . Allergies Sister   . Diabetes Sister   .  Kidney disease Sister   . Heart disease Sister     age 82's  . Esophageal cancer Neg Hx   . Rectal cancer Neg Hx   . Stomach cancer Neg Hx     Social History Social History  Substance Use Topics  . Smoking status: Former Smoker    Packs/day: 0.50    Years: 10.00    Quit date: 04/18/1980  . Smokeless tobacco: Never Used  . Alcohol use No     Allergies   Morphine and related and Promethazine hcl   Review of Systems Review of Systems  All other systems reviewed and are negative.    Physical Exam Updated Vital Signs BP 196/78 (BP Location: Left Arm)   Pulse 90   Temp 99.8 F (37.7 C) (Oral)   Resp 18   Ht 5\' 4"  (1.626 m)   Wt 260 lb (117.9 kg)   SpO2 95%   BMI 44.63 kg/m   Physical Exam  Nursing note and vitals reviewed.  70 year old female, resting comfortably and in no acute distress. Vital signs are significant for hypertension. Oxygen saturation is 95%, which is normal. Head is normocephalic and atraumatic. PERRLA, EOMI. Oropharynx is clear. There is erythema and swelling of the left side of her face with some vesicles present. There is slight swelling of the left upper gingiva without definite abscess or ulcer. The entire area is very tender. Neck is supple with mild to moderate tenderness diffusely. There is no adenopathy or JVD. Back is nontender and there is no CVA tenderness. Lungs are clear without rales, wheezes, or rhonchi. Chest is nontender. Heart has regular rate and rhythm without murmur. Abdomen is soft, flat, nontender without masses or hepatosplenomegaly and peristalsis is normoactive. Extremities have no cyanosis or edema, full range of motion is present. Skin is warm and dry without rash. Neurologic: She is awake and alert and oriented 3 cranial nerves are intact, there are no motor or sensory deficits.  ED Treatments / Results  Labs (all labs ordered are listed, but only abnormal results are displayed) Labs Reviewed  COMPREHENSIVE  METABOLIC PANEL - Abnormal; Notable for the following:       Result Value   Glucose, Bld 195 (*)    BUN 27 (*)    Creatinine, Ser 1.83 (*)    Calcium 8.6 (*)    Total Protein 8.2 (*)    ALT 9 (*)    GFR calc non Af Amer 27 (*)    GFR calc Af Amer 31 (*)    All other components within normal limits  CBC WITH DIFFERENTIAL/PLATELET - Abnormal; Notable for the following:    Hemoglobin 11.7 (*)    MCV 73.9 (*)    MCH 23.5 (*)    RDW 16.2 (*)    Monocytes Absolute 1.7 (*)    All other components within normal limits  SEDIMENTATION RATE - Abnormal; Notable for the following:    Sed Rate 33 (*)    All other components within normal limits  CULTURE, BLOOD (ROUTINE X 2)  CULTURE, BLOOD (ROUTINE X 2)  URINALYSIS, ROUTINE W REFLEX MICROSCOPIC (NOT AT [facility])  I-STAT CG4 LACTIC ACID, ED    Radiology Ct Maxillofacial Wo Contrast  Result Date: 11/27/2015 CLINICAL DATA:  70 y/o F; left-sided facial swelling with onset Friday is increasing and ulcer to tender skin on nose. EXAM: CT MAXILLOFACIAL WITHOUT CONTRAST TECHNIQUE: Multidetector  CT imaging of the maxillofacial structures was performed. Multiplanar CT image reconstructions were also generated. A small metallic BB was placed on the right temple in order to reliably differentiate right from left. COMPARISON:  None. FINDINGS: Osseous: No fracture or mandibular dislocation. No destructive process. Orbits: No intraorbital traumatic or inflammatory finding. Sinuses: Mild sphenoid and maxillary sinus mucosal thickening. Soft tissues: Centered within the soft tissues of the left nasolabial fold deep to dermal irregularities in the left aspect of the nose there is soft tissue thickening and induration (series 2, image 43). Surrounding this area extending superiorly into periorbital soft tissues, laterally over the malar subcutaneous fat, inferiorly into the left superior lip there is fat stranding and dermal thickening compatible with inflammatory change.  No discrete low-attenuation fluid collection is identified. Limited intracranial: No significant or unexpected finding. Calcific atherosclerosis of carotid siphons. IMPRESSION: Inflammatory changes centered in the soft tissues of the left nasal ala and left nasal labial fold extending superiorly to left periorbital soft tissues, laterally over the cheek, and inferiorly into the left upper lip probably representing cellulitis. No discrete low-attenuation fluid collection is identified. No infiltration of the intraorbital space or deep cervical spaces. No osseous erosion. Electronically Signed   By: Kristine Garbe M.D.   On: 11/27/2015 22:58    Procedures Procedures (including critical care time)  Medications Ordered in ED Medications  oxyCODONE-acetaminophen (PERCOCET/ROXICET) 5-325 MG per tablet 1 tablet (not administered)  vancomycin (VANCOCIN) IVPB 1000 mg/200 mL premix (1,000 mg Intravenous New Bag/Given 11/27/15 2241)  acetaminophen (TYLENOL) tablet 650 mg (650 mg Oral Given 11/27/15 2240)     Initial Impression / Assessment and Plan / ED Course  I have reviewed the triage vital signs and the nursing notes.  Pertinent labs & imaging results that were available during my care of the patient were reviewed by me and considered in my medical decision making (see chart for details).  Clinical Course    Facial pain and erythema and swelling consistent with cellulitis. Location just on one side of the face makes me wonder about possible atypical presentation of herpes zoster. Will send for CT to look for occult abscess and she is started on vancomycin empirically. Old records are reviewed, and she has no relevant past visits.  CT shows inflammatory changes but no abscess. WBC is normal. Sedimentation rate is mildly elevated. At this point, I do feel the patient is to L2 go home. She was given oxycodone-acetaminophen for pain with moderate relief. She continues to have elevated blood  pressures. Case is discussed with Dr. Loleta Books of [facility] who agrees to admit the patient under observation status.  Final Clinical Impressions(s) / ED Diagnoses   Final diagnoses:  Cellulitis of face  Renal insufficiency    New Prescriptions New Prescriptions   No medications on file     Delora Fuel, MD 82/42/35 [id number]

## 2015-11-28 ENCOUNTER — Encounter (HOSPITAL_COMMUNITY): Payer: Self-pay | Admitting: Family Medicine

## 2015-11-28 DIAGNOSIS — D638 Anemia in other chronic diseases classified elsewhere: Secondary | ICD-10-CM

## 2015-11-28 DIAGNOSIS — E1122 Type 2 diabetes mellitus with diabetic chronic kidney disease: Secondary | ICD-10-CM | POA: Diagnosis present

## 2015-11-28 DIAGNOSIS — G4733 Obstructive sleep apnea (adult) (pediatric): Secondary | ICD-10-CM

## 2015-11-28 DIAGNOSIS — L03213 Periorbital cellulitis: Secondary | ICD-10-CM | POA: Diagnosis not present

## 2015-11-28 DIAGNOSIS — I10 Essential (primary) hypertension: Secondary | ICD-10-CM | POA: Diagnosis not present

## 2015-11-28 DIAGNOSIS — B028 Zoster with other complications: Secondary | ICD-10-CM | POA: Diagnosis not present

## 2015-11-28 DIAGNOSIS — J449 Chronic obstructive pulmonary disease, unspecified: Secondary | ICD-10-CM | POA: Diagnosis not present

## 2015-11-28 DIAGNOSIS — H269 Unspecified cataract: Secondary | ICD-10-CM | POA: Diagnosis present

## 2015-11-28 DIAGNOSIS — Z7951 Long term (current) use of inhaled steroids: Secondary | ICD-10-CM | POA: Diagnosis not present

## 2015-11-28 DIAGNOSIS — Z888 Allergy status to other drugs, medicaments and biological substances status: Secondary | ICD-10-CM | POA: Diagnosis not present

## 2015-11-28 DIAGNOSIS — L03211 Cellulitis of face: Secondary | ICD-10-CM | POA: Diagnosis not present

## 2015-11-28 DIAGNOSIS — B0231 Zoster conjunctivitis: Secondary | ICD-10-CM | POA: Diagnosis not present

## 2015-11-28 DIAGNOSIS — E119 Type 2 diabetes mellitus without complications: Secondary | ICD-10-CM

## 2015-11-28 DIAGNOSIS — I5032 Chronic diastolic (congestive) heart failure: Secondary | ICD-10-CM | POA: Diagnosis present

## 2015-11-28 DIAGNOSIS — Z833 Family history of diabetes mellitus: Secondary | ICD-10-CM | POA: Diagnosis not present

## 2015-11-28 DIAGNOSIS — I130 Hypertensive heart and chronic kidney disease with heart failure and stage 1 through stage 4 chronic kidney disease, or unspecified chronic kidney disease: Secondary | ICD-10-CM | POA: Diagnosis present

## 2015-11-28 DIAGNOSIS — R410 Disorientation, unspecified: Secondary | ICD-10-CM | POA: Diagnosis not present

## 2015-11-28 DIAGNOSIS — H2513 Age-related nuclear cataract, bilateral: Secondary | ICD-10-CM | POA: Diagnosis not present

## 2015-11-28 DIAGNOSIS — T783XXA Angioneurotic edema, initial encounter: Secondary | ICD-10-CM | POA: Diagnosis present

## 2015-11-28 DIAGNOSIS — Z6841 Body Mass Index (BMI) 40.0 and over, adult: Secondary | ICD-10-CM | POA: Diagnosis not present

## 2015-11-28 DIAGNOSIS — R5383 Other fatigue: Secondary | ICD-10-CM | POA: Diagnosis not present

## 2015-11-28 DIAGNOSIS — H25013 Cortical age-related cataract, bilateral: Secondary | ICD-10-CM | POA: Diagnosis not present

## 2015-11-28 DIAGNOSIS — N183 Chronic kidney disease, stage 3 (moderate): Secondary | ICD-10-CM

## 2015-11-28 DIAGNOSIS — Z794 Long term (current) use of insulin: Secondary | ICD-10-CM | POA: Diagnosis not present

## 2015-11-28 DIAGNOSIS — B0222 Postherpetic trigeminal neuralgia: Secondary | ICD-10-CM | POA: Diagnosis not present

## 2015-11-28 DIAGNOSIS — M79609 Pain in unspecified limb: Secondary | ICD-10-CM | POA: Diagnosis not present

## 2015-11-28 DIAGNOSIS — Z79899 Other long term (current) drug therapy: Secondary | ICD-10-CM | POA: Diagnosis not present

## 2015-11-28 DIAGNOSIS — K3184 Gastroparesis: Secondary | ICD-10-CM

## 2015-11-28 DIAGNOSIS — Z885 Allergy status to narcotic agent status: Secondary | ICD-10-CM | POA: Diagnosis not present

## 2015-11-28 DIAGNOSIS — Z87891 Personal history of nicotine dependence: Secondary | ICD-10-CM | POA: Diagnosis not present

## 2015-11-28 DIAGNOSIS — R509 Fever, unspecified: Secondary | ICD-10-CM | POA: Diagnosis not present

## 2015-11-28 DIAGNOSIS — B029 Zoster without complications: Secondary | ICD-10-CM | POA: Diagnosis present

## 2015-11-28 DIAGNOSIS — E1143 Type 2 diabetes mellitus with diabetic autonomic (poly)neuropathy: Secondary | ICD-10-CM | POA: Diagnosis present

## 2015-11-28 DIAGNOSIS — G9340 Encephalopathy, unspecified: Secondary | ICD-10-CM | POA: Diagnosis present

## 2015-11-28 DIAGNOSIS — Z7982 Long term (current) use of aspirin: Secondary | ICD-10-CM | POA: Diagnosis not present

## 2015-11-28 LAB — GLUCOSE, CAPILLARY
GLUCOSE-CAPILLARY: 238 mg/dL — AB (ref 65–99)
Glucose-Capillary: 104 mg/dL — ABNORMAL HIGH (ref 65–99)

## 2015-11-28 MED ORDER — MONTELUKAST SODIUM 10 MG PO TABS
10.0000 mg | ORAL_TABLET | Freq: Every day | ORAL | Status: DC
  Administered 2015-11-28 – 2015-12-04 (×7): 10 mg via ORAL
  Filled 2015-11-28 (×7): qty 1

## 2015-11-28 MED ORDER — ONDANSETRON HCL 4 MG PO TABS
4.0000 mg | ORAL_TABLET | Freq: Four times a day (QID) | ORAL | Status: DC | PRN
  Administered 2015-12-04: 4 mg via ORAL
  Filled 2015-11-28: qty 1

## 2015-11-28 MED ORDER — VALACYCLOVIR HCL 500 MG PO TABS: 1000.0000 mg | ORAL_TABLET | Freq: Every day | ORAL | Status: DC

## 2015-11-28 MED ORDER — GABAPENTIN 300 MG PO CAPS
300.0000 mg | ORAL_CAPSULE | Freq: Two times a day (BID) | ORAL | Status: DC
  Administered 2015-11-28 – 2015-11-29 (×3): 300 mg via ORAL
  Filled 2015-11-28 (×3): qty 1

## 2015-11-28 MED ORDER — ACETAMINOPHEN 650 MG RE SUPP
650.0000 mg | Freq: Four times a day (QID) | RECTAL | Status: DC | PRN
  Administered 2015-11-28 – 2015-11-30 (×2): 650 mg via RECTAL
  Filled 2015-11-28 (×2): qty 1

## 2015-11-28 MED ORDER — DEXTROSE 5 % IV SOLN
10.0000 mg/kg | Freq: Two times a day (BID) | INTRAVENOUS | Status: DC
  Administered 2015-11-28 – 2015-12-01 (×7): 800 mg via INTRAVENOUS
  Filled 2015-11-28 (×8): qty 16

## 2015-11-28 MED ORDER — DOXAZOSIN MESYLATE 2 MG PO TABS
2.0000 mg | ORAL_TABLET | Freq: Every day | ORAL | Status: DC
  Administered 2015-11-28 – 2015-12-03 (×6): 2 mg via ORAL
  Filled 2015-11-28 (×7): qty 1

## 2015-11-28 MED ORDER — HYDRALAZINE HCL 50 MG PO TABS
100.0000 mg | ORAL_TABLET | Freq: Three times a day (TID) | ORAL | Status: DC
  Administered 2015-11-28 – 2015-12-04 (×10): 100 mg via ORAL
  Filled 2015-11-28 (×16): qty 2

## 2015-11-28 MED ORDER — FUROSEMIDE 80 MG PO TABS
80.0000 mg | ORAL_TABLET | Freq: Two times a day (BID) | ORAL | Status: DC
  Administered 2015-11-28 – 2015-12-01 (×6): 80 mg via ORAL
  Filled 2015-11-28 (×6): qty 1

## 2015-11-28 MED ORDER — ONDANSETRON HCL 4 MG/2ML IJ SOLN: 4.0000 mg | Freq: Four times a day (QID) | INTRAMUSCULAR | Status: DC | PRN

## 2015-11-28 MED ORDER — FERROUS SULFATE 325 (65 FE) MG PO TABS
325.0000 mg | ORAL_TABLET | Freq: Every day | ORAL | Status: DC
  Administered 2015-11-28 – 2015-12-04 (×6): 325 mg via ORAL
  Filled 2015-11-28 (×6): qty 1

## 2015-11-28 MED ORDER — CYCLOBENZAPRINE HCL 10 MG PO TABS
10.0000 mg | ORAL_TABLET | Freq: Every day | ORAL | Status: DC
  Administered 2015-11-28 – 2015-11-29 (×2): 10 mg via ORAL
  Filled 2015-11-28 (×2): qty 1

## 2015-11-28 MED ORDER — METOLAZONE 2.5 MG PO TABS
2.5000 mg | ORAL_TABLET | ORAL | Status: DC
  Administered 2015-11-29 – 2015-12-01 (×2): 2.5 mg via ORAL
  Filled 2015-11-28 (×2): qty 1

## 2015-11-28 MED ORDER — ATORVASTATIN CALCIUM 10 MG PO TABS
10.0000 mg | ORAL_TABLET | Freq: Every day | ORAL | Status: DC
  Administered 2015-11-28 – 2015-12-04 (×7): 10 mg via ORAL
  Filled 2015-11-28 (×7): qty 1

## 2015-11-28 MED ORDER — INSULIN ASPART 100 UNIT/ML ~~LOC~~ SOLN
0.0000 [IU] | Freq: Three times a day (TID) | SUBCUTANEOUS | Status: DC
Start: 2015-11-28 — End: 2015-12-04
  Administered 2015-11-29 (×2): 3 [IU] via SUBCUTANEOUS
  Administered 2015-11-30 (×2): 2 [IU] via SUBCUTANEOUS
  Administered 2015-11-30: 3 [IU] via SUBCUTANEOUS
  Administered 2015-12-01 (×2): 5 [IU] via SUBCUTANEOUS
  Administered 2015-12-01 – 2015-12-02 (×2): 2 [IU] via SUBCUTANEOUS
  Administered 2015-12-02 (×2): 3 [IU] via SUBCUTANEOUS
  Administered 2015-12-03: 2 [IU] via SUBCUTANEOUS
  Administered 2015-12-03 (×2): 3 [IU] via SUBCUTANEOUS
  Administered 2015-12-04 (×2): 2 [IU] via SUBCUTANEOUS

## 2015-11-28 MED ORDER — RALOXIFENE HCL 60 MG PO TABS
60.0000 mg | ORAL_TABLET | Freq: Every day | ORAL | Status: DC
  Administered 2015-11-28 – 2015-12-04 (×7): 60 mg via ORAL
  Filled 2015-11-28 (×7): qty 1

## 2015-11-28 MED ORDER — METOCLOPRAMIDE HCL 10 MG PO TABS
10.0000 mg | ORAL_TABLET | Freq: Three times a day (TID) | ORAL | Status: DC
  Administered 2015-11-28 (×3): 10 mg via ORAL
  Filled 2015-11-28 (×2): qty 1

## 2015-11-28 MED ORDER — MOMETASONE FURO-FORMOTEROL FUM 200-5 MCG/ACT IN AERO
2.0000 | INHALATION_SPRAY | Freq: Two times a day (BID) | RESPIRATORY_TRACT | Status: DC
  Administered 2015-11-29 – 2015-12-04 (×10): 2 via RESPIRATORY_TRACT
  Filled 2015-11-28 (×2): qty 8.8

## 2015-11-28 MED ORDER — ISOSORBIDE MONONITRATE ER 60 MG PO TB24
120.0000 mg | ORAL_TABLET | Freq: Every day | ORAL | Status: DC
  Administered 2015-11-28 – 2015-12-04 (×6): 120 mg via ORAL
  Filled 2015-11-28 (×7): qty 2

## 2015-11-28 MED ORDER — ENOXAPARIN SODIUM 40 MG/0.4ML ~~LOC~~ SOLN
40.0000 mg | SUBCUTANEOUS | Status: DC
  Administered 2015-11-28 – 2015-11-30 (×3): 40 mg via SUBCUTANEOUS
  Filled 2015-11-28 (×3): qty 0.4

## 2015-11-28 MED ORDER — ASPIRIN EC 81 MG PO TBEC
81.0000 mg | DELAYED_RELEASE_TABLET | Freq: Every day | ORAL | Status: DC
  Administered 2015-11-28 – 2015-12-04 (×7): 81 mg via ORAL
  Filled 2015-11-28 (×7): qty 1

## 2015-11-28 MED ORDER — TRAMADOL HCL 50 MG PO TABS
50.0000 mg | ORAL_TABLET | Freq: Two times a day (BID) | ORAL | Status: DC
  Administered 2015-11-28 – 2015-11-29 (×3): 50 mg via ORAL
  Filled 2015-11-28 (×3): qty 1

## 2015-11-28 MED ORDER — ACETAMINOPHEN 325 MG PO TABS
650.0000 mg | ORAL_TABLET | Freq: Four times a day (QID) | ORAL | Status: DC | PRN
  Administered 2015-11-29 – 2015-12-03 (×3): 650 mg via ORAL
  Filled 2015-11-28 (×3): qty 2

## 2015-11-28 MED ORDER — LOSARTAN POTASSIUM 50 MG PO TABS
100.0000 mg | ORAL_TABLET | Freq: Every day | ORAL | Status: DC
  Administered 2015-11-28 – 2015-12-01 (×3): 100 mg via ORAL
  Filled 2015-11-28 (×4): qty 2

## 2015-11-28 MED ORDER — DONEPEZIL HCL 10 MG PO TABS
10.0000 mg | ORAL_TABLET | Freq: Every day | ORAL | Status: DC
  Administered 2015-11-28 – 2015-12-03 (×6): 10 mg via ORAL
  Filled 2015-11-28 (×6): qty 1

## 2015-11-28 MED ORDER — DEXTROSE 5 % IV SOLN
500.0000 mg | Freq: Two times a day (BID) | INTRAVENOUS | Status: DC
  Administered 2015-11-28 – 2015-12-02 (×9): 500 mg via INTRAVENOUS
  Filled 2015-11-28 (×10): qty 5

## 2015-11-28 MED ORDER — METOCLOPRAMIDE HCL 5 MG/ML IJ SOLN
10.0000 mg | Freq: Three times a day (TID) | INTRAMUSCULAR | Status: DC
  Administered 2015-11-28 – 2015-11-30 (×8): 10 mg via INTRAVENOUS
  Filled 2015-11-28 (×8): qty 2

## 2015-11-28 MED ORDER — POTASSIUM CHLORIDE CRYS ER 20 MEQ PO TBCR
20.0000 meq | EXTENDED_RELEASE_TABLET | Freq: Every day | ORAL | Status: DC
  Administered 2015-11-28 – 2015-12-01 (×4): 20 meq via ORAL
  Filled 2015-11-28 (×4): qty 1

## 2015-11-28 MED ORDER — INSULIN GLARGINE 100 UNIT/ML ~~LOC~~ SOLN
15.0000 [IU] | Freq: Every day | SUBCUTANEOUS | Status: DC
  Administered 2015-11-28 – 2015-12-03 (×6): 15 [IU] via SUBCUTANEOUS
  Filled 2015-11-28 (×8): qty 0.15

## 2015-11-28 MED ORDER — HYDRALAZINE HCL 20 MG/ML IJ SOLN: 5.0000 mg | Freq: Four times a day (QID) | INTRAMUSCULAR | Status: DC | PRN

## 2015-11-28 MED ORDER — EZETIMIBE 10 MG PO TABS
10.0000 mg | ORAL_TABLET | Freq: Every day | ORAL | Status: DC
  Administered 2015-11-28 – 2015-12-03 (×6): 10 mg via ORAL
  Filled 2015-11-28 (×6): qty 1

## 2015-11-28 MED ORDER — INSULIN ASPART 100 UNIT/ML ~~LOC~~ SOLN
0.0000 [IU] | Freq: Every day | SUBCUTANEOUS | Status: DC
  Administered 2015-11-28 – 2015-11-29 (×2): 2 [IU] via SUBCUTANEOUS

## 2015-11-28 MED ORDER — DEXTROSE 5 % IV SOLN
10.0000 mg/kg | INTRAVENOUS | Status: AC
  Administered 2015-11-28: 800 mg via INTRAVENOUS
  Filled 2015-11-28: qty 16

## 2015-11-28 NOTE — Consult Note (Signed)
CC:  Chief Complaint  Patient presents with  . Angioedema    HPI: Joann Gutierrez is a 70 y.o. female w/ POH of mild cataracts OU and PMH below who presents for evaluation of Herpes Zoster affecting the V2 distribution Symptoms started over the weekend. Given the periorbital swelling, ophthalmology was consulted for evaluation of ocular involvement.   Patient is heavily sedated and unable to communicate. Patient's husband states no complaints of eye problems.   ROS: Denies fever/chills, unintentional weight loss, chest pain, irregular heart rhythm, SOB, cough, wheezing, abdominal pain, melena, hematochezia, weakness, numbness, slurring of speech, facial droop, muscle weakness, joint pain, skin rash, tattoos, depressed mood  PMH: Past Medical History:  Diagnosis Date  . Adenomatous colon polyp   . Allergy   . Anemia   . Asthma       . CAD (coronary artery disease)    Mild very minimal coronary disease with 20% obtuse marginal stenosis  . Carpal tunnel syndrome on left   . CHF (congestive heart failure) ([place])   . Chronic kidney disease (CKD), stage III (moderate)   . COPD (chronic obstructive pulmonary disease) ([place])   . CVA (cerebral infarction)   . Depression   . 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 Diverticulosis   . 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 Esophageal dysmotility   . Esophageal stricture   . Gastritis   . Gastroparesis 08/21/2007  . GERD (gastroesophageal reflux disease)   . Hair loss   . Hearing loss of both ears   . Hernia, hiatal   . Hyperlipidemia   . Hypertension   . Morbid obesity ()   . Osteoarthritis   . OSTEOARTHRITIS 08/09/2006  . Osteoporosis   . 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 RENAL INSUFFICIENCY 02/16/2009  . 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 Seizures ([place])    pt thinks it has been several monthes since she had a seisure  . Sickle cell trait ([place])   . Stroke ([place])   . Tubular adenoma of colon      PSH: Past Surgical History:  Procedure Laterality Date  . ABDOMINAL HYSTERECTOMY    . ARTERY BIOPSY  01/07/2011   Procedure: MINOR BIOPSY TEMPORAL ARTERY;  Surgeon: Haywood Lasso, MD;  Location: [facility];  Service: General;  Laterality: Left;  left temporal artery biopsy  . bil foot surgery    . BREAST LUMPECTOMY     benign  . BREAST LUMPECTOMY     both breast lumps removed   . COLONOSCOPY    . COLONOSCOPY WITH PROPOFOL N/A 07/05/2015   Procedure: COLONOSCOPY WITH PROPOFOL;  Surgeon: Manus Gunning, MD;  Location: [facility];  Service: Gastroenterology;  Laterality: N/A;  . ERD  08/08/2000  . ESOPHAGEAL MANOMETRY N/A 03/13/2015   Procedure: ESOPHAGEAL MANOMETRY (EM);  Surgeon: Manus Gunning, MD;  Location: [facility];  Service: Gastroenterology;  Laterality: N/A;  . ESOPHAGOGASTRODUODENOSCOPY  06/25/2006  . ESOPHAGOGASTRODUODENOSCOPY (EGD) WITH PROPOFOL N/A 07/05/2015   Procedure: ESOPHAGOGASTRODUODENOSCOPY (EGD) WITH PROPOFOL;  Surgeon: Manus Gunning, MD;  Location: [facility];  Service: Gastroenterology;  Laterality: N/A;  . HERNIA REPAIR    . LEFT AND RIGHT HEART CATHETERIZATION WITH CORONARY ANGIOGRAM N/A 03/03/2013   Procedure: LEFT AND RIGHT HEART CATHETERIZATION WITH CORONARY ANGIOGRAM;  Surgeon: Minus Breeding, MD;  Location: [facility];  Service: Cardiovascular;  Laterality: N/A;  . REPLACEMENT TOTAL KNEE Left 1998  . UPPER GASTROINTESTINAL ENDOSCOPY  Meds: No current facility-administered medications on file prior to encounter.    Current Outpatient Prescriptions on File Prior to Encounter  Medication Sig Dispense Refill  . albuterol (VENTOLIN HFA) 108 (90 BASE) MCG/ACT inhaler Inhale 2 puffs into the lungs every 6 (six) hours as needed for shortness of breath. 1 Inhaler 11  . aspirin 325 MG tablet Take 325 mg by mouth daily as needed for mild pain.     Marland Kitchen aspirin EC 81 MG tablet Take 81 mg by mouth daily.    Marland Kitchen  atorvastatin (LIPITOR) 10 MG tablet Take 10 mg by mouth daily.    . Calcium Carbonate-Vit D-Min (CALTRATE PLUS PO) Take 600 mg by mouth every morning.     Marland Kitchen Cod Liver Oil CAPS Take 1 capsule by mouth daily.    . cyclobenzaprine (FLEXERIL) 10 MG tablet Take 10 mg by mouth at bedtime. Reported on 02/07/2015    . donepezil (ARICEPT) 10 MG tablet Take 10 mg by mouth at bedtime.     Marland Kitchen doxazosin (CARDURA) 2 MG tablet Take 1 tablet by mouth at bedtime.    Marland Kitchen ezetimibe (ZETIA) 10 MG tablet Take 10 mg by mouth at bedtime.     . ferrous sulfate 325 (65 FE) MG EC tablet Take 325 mg by mouth every morning. Reported on 01/25/2015    . fluticasone (FLONASE) 50 MCG/ACT nasal spray Place 2 sprays into both nostrils daily. 16 g 11  . furosemide (LASIX) 80 MG tablet Take 1 tablet (80 mg total) by mouth 2 (two) times daily.    Marland Kitchen gabapentin (NEURONTIN) 300 MG capsule Take 1 capsule (300 mg total) by mouth 2 (two) times daily. 60 capsule 5  . hydrALAZINE (APRESOLINE) 100 MG tablet Take 100 mg by mouth 3 (three) times daily.    . insulin glargine (LANTUS) 100 UNIT/ML injection Inject 0.15 mLs (15 Units total) into the skin daily. (Patient taking differently: Inject 15 Units into the skin at bedtime. ) 10 mL 11  . insulin NPH (HUMULIN N) 100 UNIT/ML injection Inject 2-13 Units into the skin 3 (three) times daily after meals. Sliding scale    . isosorbide mononitrate (IMDUR) 120 MG 24 hr tablet Take 1 tablet (120 mg total) by mouth daily. PATIENT NEEDS TO CONTACT OFFICE FOR ADDITIONAL REFILLS 15 tablet 0  . KLOR-CON M20 20 MEQ tablet Take 1 tablet by mouth daily.     Marland Kitchen losartan (COZAAR) 100 MG tablet Take 100 mg by mouth daily.    . metoCLOPramide (REGLAN) 10 MG tablet TAKE ONE TABLET BY MOUTH BEFORE MEALS AND AT BEDTIME 120 tablet 2  . metolazone (ZAROXOLYN) 2.5 MG tablet Take 1 tablet (2.5 mg total) by mouth 3 (three) times a week. Take 1 tablet by mouth on Monday, Wednesday and Friday    . montelukast (SINGULAIR) 10 MG  tablet Take 1 tablet (10 mg total) by mouth daily. 30 tablet 11  . Multiple Vitamins-Minerals (MULTIVITAMIN GUMMIES ADULT) CHEW Chew 1 capsule by mouth daily.    . nitroGLYCERIN (NITROSTAT) 0.4 MG SL tablet PLACE ONE TABLET UNDER THE TONGUE EVERY FIVE MINUTES AS NEEDED FOR CHEST PAIN 25 tablet 1  . Omega-3 Fatty Acids (FISH OIL) 1000 MG CAPS Take 1 capsule by mouth daily.    Marland Kitchen omeprazole (PRILOSEC) 20 MG capsule Take 1 capsule (20 mg total) by mouth 2 (two) times daily before a meal. 28 capsule 0  . raloxifene (EVISTA) 60 MG tablet Take 60 mg by mouth every morning.     Marland Kitchen  SYMBICORT 160-4.5 MCG/ACT inhaler INHALE 2 PUFFS INTO THE LUNGS 2 TIMES DAILY 10.2 g 4  . traMADol (ULTRAM) 50 MG tablet Take 50 mg by mouth 2 (two) times daily. Reported on 02/07/2015    . Vitamin D, Ergocalciferol, (DRISDOL) 50000 UNITS CAPS capsule Take 50,000 Units by mouth every Monday.     . OXYGEN Inhale into the lungs. CPAP with oxygen at bedtime      SH: Social History   Social History  . Marital status: Widowed    Spouse name: N/A  . Number of children: 2  . Years of education: N/A   Occupational History  . Retired Unemployed   Social History Main Topics  . Smoking status: Former Smoker    Packs/day: 0.50    Years: 10.00    Quit date: 04/18/1980  . Smokeless tobacco: Never Used  . Alcohol use No  . Drug use: No  . Sexual activity: Not Currently   Other Topics Concern  . None   Social History Narrative   Previously worked as a Electrical engineer.   Daily Caffeine Use-Coffee and Tea   Lives with a friend who is her care giver, has home health nurse come out once a week.  She has family in town- daughter, grand daughter.     FH: Family History  Problem Relation Age of Onset  . Liver cancer Mother     Liver Cancer  . Diabetes Mother   . Kidney disease Mother   . Heart disease Mother     age 71's  . Heart disease Father 22    MI  . Heart attack Father     died of MI when pt was 28  . Sickle cell  anemia Father   . Colon cancer Brother   . Cancer Brother     Colon Cancer  . Diabetes Sister   . Kidney disease Sister   . Heart disease Sister     age 41's  . Allergies Sister   . Diabetes Sister   . Kidney disease Sister   . Heart disease Sister     age 61's  . Esophageal cancer Neg Hx   . Rectal cancer Neg Hx   . Stomach cancer Neg Hx     Exam:  Lucianne Lei: OD: unable, patient heavily sedated, currently non-verbal OS: unable, patient heavily sedated, currently non-verbal  CVF: OD: unable to assess due to sedation/somnolence OS: unable to assess due to sedation/somnolence  EOM: OD: grossly full d/v, limited due to somnolence OS: grossly full d/v, limited due to somnolence  Pupils: OD: 3>2 mm, no APD OS: 3->2 mm, no APD  IOP: by [place] OD: 13 OS: 15  External: OD: no periorbital edema, no proptosis, V1-V3 intact and symmetric, good orbicularis strength OS: 1+ periorbital edema, no vesicles involving the V1-distribution. Edema > inferior extending into the maxillofacial region. Vesicles extending from the inferior lid to the cheek.   PF: 0/0  ULE: unable due to somnolence  Pen Light Exam: L/L: OD: WNL OS: 1+ lid edema, lower lid; vesicles inferior to the lower lid with some inferior periorbital crusting  C/S: OD: white and quiet OS: Diffuse 1+ injection, no chemosis  K: OD: clear, no abnormal staining OS: clear, no abnormal staining - no dendrites or pseudodendrites  A/C: OD: grossly deep and quiet appearing by pen light OS: grossly deep and quiet appearing by pen light  I: OD: round and regular OS: round and regular  L: OD: 1+ NSC OS: 1+  [place]  DFE: dilated @ 5:43 w/ Paremyd OU  V: OD: clear OS: clear  N: OD: C/D 0.55, no disc edema OS: C/D 0.55, no disc edema  M: OD: flat, no obvious macular pathology OS: flat, no obvious macular pathology  V: OD: normal appearing vessels OS: normal appearing vessels  P: OD: retina flat 360, no  obvious mass/RT/RD OS: retina flat 360, no obvious mass/RT/RD  A/P:  1. Herpes Zoster (affecting V2 distribution) - Does have periorbital cellulitis, no significant evidence of V1 involvement - Lesions in the nasolabial fold concerning for Hutchinson's sign, however -- monitor for worsening eye redness, blurred vision (as patient becomes more alert and oriented), and pain in the eye - Does have mild blepharoconjunctivitis -- would recommend Bacitracin ointment BID to the left lower lid on the affected areas to avoid bacterial superinfection  - Unfortunately, patient very somnolent and limited subjective examination. As patient becomes more awake and oriented, or if eye redness worsens - please call me to re-evaluate. I discussed this with the family in the room.  Thank you for the consultation.   Marshall Cork, MD,[facility] Ophthalmology [phone] (cell)

## 2015-11-28 NOTE — Progress Notes (Signed)
Pt is excessively sleepy. Arouses to loud voice, but unable to sustain alertness for long.  Unable to open mouth due to swelling, pain, or unable to maintain alertness long enough to open mouth. Temperature elevated, tylenol suppository administered.  Left facial lesions draining serous fluid. Swelling increasing thoughout the shift. Wendee Copp

## 2015-11-28 NOTE — ED Notes (Signed)
Pt A&Ox 4- Moderate amount of swelling to left cheek and eye area with serous drainage. Pt resting comfortably at this time.

## 2015-11-28 NOTE — ED Notes (Signed)
Delorise Shiner (daughter)- [phone]   Please call with any updates, questions, or concerns.

## 2015-11-28 NOTE — Progress Notes (Signed)
Arrived from ED at [facility]. Alert and oriented x4. Aching pain 8/10 left face. Spouse at bedside. Safety measures in place.

## 2015-11-28 NOTE — H&P (Signed)
History and Physical  Patient Name: Joann Gutierrez     SJG:[id number]    DOB: Feb 12, 1945    DOA: 11/27/2015 PCP: Kandice Hams, MD   Patient coming from: Home  Chief Complaint: Face rash  HPI: Joann Gutierrez is a 71 y.o. female with a past medical history significant for COPD/asthma, gastroparesis, HFpEF, IDDM, HTN, CKD III baseline Cr 1.5, OSA on CPAP and noct O2, and anemia  who presents with face rash for 3 days.  The patient was in her usual health until 72 hours ago when she developed aching pain in her left ear and eye as well as gums "aching".  Then Saturday she developed a rash on her left face, starting with a "bump" on her left nasolabial fold that her daughter thought was from her CPAP machine.  This progressed to numerous fluid-filled vesicles on her left cheek, nose, left upper lip, and roof of mouth.  Today, this kept progressing so they came to the ER.  ED course: -Temp 99.52F, heart rate 90, respirations and pulse ox normal, BP 196/78 -Na 141, K 4.0, Cr 1.83 (baseline 1.4-1.5), WBC 6.7K, Hgb 11.7 and microcytic -ESR 33 and lactic acid normal -CT maxillofacial protocol showed soft tissue swelling in the left face without abscess or periororbital cellulitis -She was given vancomycin for cellulitis and TRH were asked to evaluate          ROS: Review of Systems  Constitutional: Positive for fever (low grade) and malaise/fatigue.  HENT: Positive for ear discharge, ear pain and sore throat. Negative for hearing loss and tinnitus.   Eyes: Positive for blurred vision (in left eye).  Skin: Positive for rash.  Neurological: Positive for headaches. Negative for dizziness, tingling, tremors, sensory change, speech change, focal weakness, seizures and loss of consciousness.  All other systems reviewed and are negative.         Past Medical History:  Diagnosis Date  . Adenomatous colon polyp   . Allergy   . Anemia   . Asthma       . CAD (coronary artery  disease)    Mild very minimal coronary disease with 20% obtuse marginal stenosis  . Carpal tunnel syndrome on left   . CHF (congestive heart failure) ([place])   . Chronic kidney disease (CKD), stage III (moderate)   . COPD (chronic obstructive pulmonary disease) ([place])   . CVA (cerebral infarction)   . Depression   . 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 Diverticulosis   . 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 Esophageal dysmotility   . Esophageal stricture   . Gastritis   . Gastroparesis 08/21/2007  . GERD (gastroesophageal reflux disease)   . Hair loss   . Hearing loss of both ears   . Hernia, hiatal   . Hyperlipidemia   . Hypertension   . Morbid obesity ([place])   . Osteoarthritis   . OSTEOARTHRITIS 08/09/2006  . Osteoporosis   . 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 RENAL INSUFFICIENCY 02/16/2009  . 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 Seizures ([place])    pt thinks it has been several monthes since she had a seisure  . Sickle cell trait ([place])   . Stroke ([place])   . Tubular adenoma of colon     Past Surgical History:  Procedure Laterality Date  . ABDOMINAL HYSTERECTOMY    . ARTERY BIOPSY  01/07/2011   Procedure: MINOR BIOPSY TEMPORAL ARTERY;  Surgeon: Haywood Lasso, MD;  Location: [facility];  Service: General;  Laterality: Left;  left temporal artery biopsy  . bil foot surgery    . BREAST LUMPECTOMY     benign  . BREAST LUMPECTOMY     both breast lumps removed   . COLONOSCOPY    . COLONOSCOPY WITH PROPOFOL N/A 07/05/2015   Procedure: COLONOSCOPY WITH PROPOFOL;  Surgeon: Manus Gunning, MD;  Location: [facility];  Service: Gastroenterology;  Laterality: N/A;  . ERD  08/08/2000  . ESOPHAGEAL MANOMETRY N/A 03/13/2015   Procedure: ESOPHAGEAL MANOMETRY (EM);  Surgeon: Manus Gunning, MD;  Location: [facility];  Service: Gastroenterology;  Laterality: N/A;  . ESOPHAGOGASTRODUODENOSCOPY  06/25/2006  .  ESOPHAGOGASTRODUODENOSCOPY (EGD) WITH PROPOFOL N/A 07/05/2015   Procedure: ESOPHAGOGASTRODUODENOSCOPY (EGD) WITH PROPOFOL;  Surgeon: Manus Gunning, MD;  Location: [facility];  Service: Gastroenterology;  Laterality: N/A;  . HERNIA REPAIR    . LEFT AND RIGHT HEART CATHETERIZATION WITH CORONARY ANGIOGRAM N/A 03/03/2013   Procedure: LEFT AND RIGHT HEART CATHETERIZATION WITH CORONARY ANGIOGRAM;  Surgeon: Minus Breeding, MD;  Location: [facility] LAB;  Service: Cardiovascular;  Laterality: N/A;  . REPLACEMENT TOTAL KNEE Left 1998  . UPPER GASTROINTESTINAL ENDOSCOPY      Social History: Patient lives with her husband.  The patient walks unassisted.  SHe used to be a CNA.  She is from [place].  Remote former smoker.    Allergies  Allergen Reactions  . Morphine And Related Other (See Comments)    Family request not to be given, reports pt does not wake up when given   . Promethazine Hcl Other (See Comments)    REACTION: lethargy    Family history: family history includes Allergies in her sister; Cancer in her brother; Colon cancer in her brother; Diabetes in her mother, sister, and sister; Heart attack in her father; Heart disease in her mother, sister, and sister; Heart disease (age of onset: 27) in her father; Kidney disease in her mother, sister, and sister; Liver cancer in her mother; Sickle cell anemia in her father.  Prior to Admission medications   Medication Sig Start Date End Date Taking? Authorizing Provider  albuterol (VENTOLIN HFA) 108 (90 BASE) MCG/ACT inhaler Inhale 2 puffs into the lungs every 6 (six) hours as needed for shortness of breath. 11/10/14  Yes [name], MD  aspirin 325 MG tablet Take 325 mg by mouth daily as needed for mild pain.    Yes Historical Provider, MD  aspirin EC 81 MG tablet Take 81 mg by mouth daily.   Yes Historical Provider, MD  atorvastatin (LIPITOR) 10 MG tablet Take 10 mg by mouth daily.   Yes Historical Provider, MD  Calcium Carbonate-Vit  D-Min (CALTRATE PLUS PO) Take 600 mg by mouth every morning.    Yes Historical Provider, MD  [facility] Liver Oil CAPS Take 1 capsule by mouth daily.   Yes Historical Provider, MD  cyclobenzaprine (FLEXERIL) 10 MG tablet Take 10 mg by mouth at bedtime. Reported on 02/07/2015   Yes Historical Provider, MD  donepezil (ARICEPT) 10 MG tablet Take 10 mg by mouth at bedtime.    Yes Historical Provider, MD  doxazosin (CARDURA) 2 MG tablet Take 1 tablet by mouth at bedtime. 10/10/14  Yes Historical Provider, MD  ezetimibe (ZETIA) 10 MG tablet Take 10 mg by mouth at bedtime.    Yes Historical Provider, MD  ferrous sulfate 325 (65 FE) MG EC tablet Take 325 mg by mouth every morning. Reported on 01/25/2015  Yes Historical Provider, MD  fluticasone (FLONASE) 50 MCG/ACT nasal spray Place 2 sprays into both nostrils daily. 11/10/14 08/27/17 Yes [name], MD  furosemide (LASIX) 80 MG tablet Take 1 tablet (80 mg total) by mouth 2 (two) times daily. 05/28/15  Yes Hosie Poisson, MD  gabapentin (NEURONTIN) 300 MG capsule Take 1 capsule (300 mg total) by mouth 2 (two) times daily. 04/29/11  Yes Renato Shin, MD  hydrALAZINE (APRESOLINE) 100 MG tablet Take 100 mg by mouth 3 (three) times daily.   Yes Historical Provider, MD  insulin glargine (LANTUS) 100 UNIT/ML injection Inject 0.15 mLs (15 Units total) into the skin daily. Patient taking differently: Inject 15 Units into the skin at bedtime.  09/18/13  Yes Maryann Mikhail, DO  insulin NPH (HUMULIN N) 100 UNIT/ML injection Inject 2-13 Units into the skin 3 (three) times daily after meals. Sliding scale 07/07/12  Yes Renato Shin, MD  isosorbide mononitrate (IMDUR) 120 MG 24 hr tablet Take 1 tablet (120 mg total) by mouth daily. PATIENT NEEDS TO CONTACT OFFICE FOR ADDITIONAL REFILLS 01/11/15  Yes Minus Breeding, MD  KLOR-CON M20 20 MEQ tablet Take 1 tablet by mouth daily.  06/19/11  Yes Historical Provider, MD  losartan (COZAAR) 100 MG tablet Take 100 mg by mouth daily.   Yes Historical  Provider, MD  metoCLOPramide (REGLAN) 10 MG tablet TAKE ONE TABLET BY MOUTH BEFORE MEALS AND AT BEDTIME 11/21/15  Yes Tanda Rockers, MD  metolazone (ZAROXOLYN) 2.5 MG tablet Take 1 tablet (2.5 mg total) by mouth 3 (three) times a week. Take 1 tablet by mouth on Monday, Wednesday and Friday 05/24/15  Yes Hosie Poisson, MD  montelukast (SINGULAIR) 10 MG tablet Take 1 tablet (10 mg total) by mouth daily. 11/10/14  Yes [name], MD  Multiple Vitamins-Minerals (MULTIVITAMIN GUMMIES ADULT) CHEW Chew 1 capsule by mouth daily.   Yes Historical Provider, MD  nitroGLYCERIN (NITROSTAT) 0.4 MG SL tablet PLACE ONE TABLET UNDER THE TONGUE EVERY FIVE MINUTES AS NEEDED FOR CHEST PAIN 10/04/15  Yes Minus Breeding, MD  Omega-3 Fatty Acids (FISH OIL) 1000 MG CAPS Take 1 capsule by mouth daily.   Yes Historical Provider, MD  omeprazole (PRILOSEC) 20 MG capsule Take 1 capsule (20 mg total) by mouth 2 (two) times daily before a meal. 09/18/15 11/27/15 Yes Manus Gunning, MD  raloxifene (EVISTA) 60 MG tablet Take 60 mg by mouth every morning.    Yes Historical Provider, MD  SYMBICORT 160-4.5 MCG/ACT inhaler INHALE 2 PUFFS INTO THE LUNGS 2 TIMES DAILY 11/23/15  Yes Tammy S Parrett, NP  traMADol (ULTRAM) 50 MG tablet Take 50 mg by mouth 2 (two) times daily. Reported on 02/07/2015   Yes Historical Provider, MD  Vitamin D, Ergocalciferol, (DRISDOL) 50000 UNITS CAPS capsule Take 50,000 Units by mouth every Monday.    Yes Historical Provider, MD  OXYGEN Inhale into the lungs. CPAP with oxygen at bedtime    Historical Provider, MD       Physical Exam: BP 172/73   Pulse 76   Temp 99.2 F (37.3 C) (Oral)   Resp 18   Ht '5\' 4"'  (1.626 m)   Wt 117.9 kg (260 lb)   SpO2 97%   BMI 44.63 kg/m  General appearance: Well-developed, elderly adult female, alert and in no acute distress.   Eyes: Anicteric, conjunctiva pink, left eye watery and with mild hyperemia, lid appears normal. PERRL.  Equivocal mild decreased visual  acuity left eye.   ENT: No nasal deformity, discharge,  epistaxis.  Hearing poor. OP moist with whitish lesions on left palate:     Neck: No neck masses.  Trachea midline.  No thyromegaly.  Tenderness throughout neck, worse on left. Lymph: No cervical or supraclavicular lymphadenopathy. Skin: Warm and dry.  No jaundice.  Vesicles on left nose, maxilla, not extending to ear, involving upper left lip, not crossing midline with yellowish crust:   Cardiac: RRR, nl S1-S2, no murmurs appreciated.  Capillary refill is brisk.  JVP normal.  No LE edema.  Radial and DP pulses 2+ and symmetric. Respiratory: Normal respiratory rate and rhythm.  CTAB without rales or wheezes. Abdomen: Abdomen soft.  No TTP. No ascites, distension, hepatosplenomegaly.   MSK: No deformities or effusions.  No cyanosis or clubbing. Neuro: Cranial nerves normal.  Sensation intact to light touch. Speech is fluent.  Muscle strength normal.    Psych: Sensorium intact and responding to questions, attention normal.  Behavior appropriate.  Affect normal.  Judgment and insight appear normal.     Labs on Admission:  I have personally reviewed following labs and imaging studies: CBC:  Recent Labs Lab 11/24/15 1045 11/27/15 2149  WBC  --  6.7  NEUTROABS  --  3.8  HGB 11.8* 11.7*  HCT  --  36.8  MCV  --  73.9*  PLT  --  381   Basic Metabolic Panel:  Recent Labs Lab 11/27/15 2149  NA 141  K 4.0  CL 104  CO2 26  GLUCOSE 195*  BUN 27*  CREATININE 1.83*  CALCIUM 8.6*   GFR: Estimated Creatinine Clearance: 36.1 mL/min (by C-G formula based on SCr of 1.83 mg/dL (H)).  Liver Function Tests:  Recent Labs Lab 11/27/15 2149  AST 16  ALT 9*  ALKPHOS 78  BILITOT 0.6  PROT 8.2*  ALBUMIN 3.5   No results for input(s): LIPASE, AMYLASE in the last 168 hours. No results for input(s): AMMONIA in the last 168 hours. Coagulation Profile: No results for input(s): INR, PROTIME in the last 168 hours. Cardiac  Enzymes: No results for input(s): CKTOTAL, CKMB, CKMBINDEX, TROPONINI in the last 168 hours. BNP (last 3 results) No results for input(s): PROBNP in the last 8760 hours. HbA1C: No results for input(s): HGBA1C in the last 72 hours. CBG: No results for input(s): GLUCAP in the last 168 hours. Lipid Profile: No results for input(s): CHOL, HDL, LDLCALC, TRIG, CHOLHDL, LDLDIRECT in the last 72 hours. Thyroid Function Tests: No results for input(s): TSH, T4TOTAL, FREET4, T3FREE, THYROIDAB in the last 72 hours. Anemia Panel: No results for input(s): VITAMINB12, FOLATE, FERRITIN, TIBC, IRON, RETICCTPCT in the last 72 hours. Sepsis Labs: Invalid input(s): PROCALCITONIN, LACTICIDVEN No results found for this or any previous visit (from the past 240 hour(s)).       Radiological Exams on Admission: Personally reviewed: Ct Maxillofacial Wo Contrast  Result Date: 11/27/2015 CLINICAL DATA:  70 y/o F; left-sided facial swelling with onset Friday is increasing and ulcer to tender skin on nose. EXAM: CT MAXILLOFACIAL WITHOUT CONTRAST TECHNIQUE: Multidetector CT imaging of the maxillofacial structures was performed. Multiplanar CT image reconstructions were also generated. A small metallic BB was placed on the right temple in order to reliably differentiate right from left. COMPARISON:  None. FINDINGS: Osseous: No fracture or mandibular dislocation. No destructive process. Orbits: No intraorbital traumatic or inflammatory finding. Sinuses: Mild sphenoid and maxillary sinus mucosal thickening. Soft tissues: Centered within the soft tissues of the left nasolabial fold deep to dermal irregularities in the left aspect of  the nose there is soft tissue thickening and induration (series 2, image 43). Surrounding this area extending superiorly into periorbital soft tissues, laterally over the malar subcutaneous fat, inferiorly into the left superior lip there is fat stranding and dermal thickening compatible with  inflammatory change. No discrete low-attenuation fluid collection is identified. Limited intracranial: No significant or unexpected finding. Calcific atherosclerosis of carotid siphons. IMPRESSION: Inflammatory changes centered in the soft tissues of the left nasal ala and left nasal labial fold extending superiorly to left periorbital soft tissues, laterally over the cheek, and inferiorly into the left upper lip probably representing cellulitis. No discrete low-attenuation fluid collection is identified. No infiltration of the intraorbital space or deep cervical spaces. No osseous erosion. Electronically Signed   By: Kristine Garbe M.D.   On: 11/27/2015 22:58              Assessment/Plan  1. Facial rash:  Suspect herpes zoster of V2.  Possibly with some bacterial superinfection/impetigo.   -IV acyclovir, renally dosed -Consult to Ophthalmology, appreciate cares -Cefazolin 500 mg IV BID for superinfection -Contact precautions    2. OSA:  -Continue CPAP at night with bleed in O2  3. CKD:  Stable, at baseline.    4. Gastroparesis:  -Continue Reglan TID and QHS  5. Anemia:  Anemia of chronic disease and renal disease.  Stable.  At baseline. -Continue iron  6. HTN:  -Continue Imdur, losartan, hydralazine, Cardura -Continue statin, aspirin, Zetia  7. COPD/Asthma:  -Continue Symbicort as formulary alternative -Continue montelukast  8. IDDM: -Continue Lantus -SSI with meals  9. Chronic diastolic CHF: Appears euvolemic.  EF 60-65% last May. -Continue furosemide, K -Continue metolazone  10. Other medications: -Continue Evista -Continue tramadol and cyclobenzaprine -Continue Aricept -Continue gabapentin          DVT prophylaxis: Lovenox  Code Status: FULL  Family Communication: Daughter and husband at bedside  Disposition Plan: Anticipate IV acyclovir and consultation with opthalmology. Consults called: None overnight Admission status:  INPATIENT< med surg    Medical decision making: Patient seen at 12:05 AM on 11/28/2015.  The patient was discussed with Dr. Roxanne Mins.  What exists of the patient's chart was reviewed in depth and summarized above.  Clinical condition: stable.        Edwin Dada [facility] Pager [phone]      At the time of admission, it appears that the appropriate admission status for this patient is INPATIENT. This is judged to be reasonable and necessary in order to provide the required intensity of service to ensure the patient's safety given the presenting symptoms, physical exam findings, and initial radiographic and laboratory data in the context of their chronic comorbidities.  Together, these circumstances are felt to place her/him at high risk for further clinical deterioration threatening life, limb, or organ. The following factors support the admission status of inpatient:   A. The patient's presenting symptoms include face rash, malaise, vision changes, face pain, face swelling B. The worrisome physical exam findings include rash, facial swelling, visual changes. C. The initial radiographic and laboratory data are worrisome because of poor renal function, anemia D. The chronic co-morbidities include nondependent diabetes, obstructive sleep apnea on CPAP, asthma, gastric paresis, anemia, chronic kidney disease, hypertension E. Patient requires inpatient status due to high intensity of service, high risk for further deterioration and high frequency of surveillance required because of this acute illness that poses a threat to bodily function (in particular permament vision loss from ophthalmologic involvement of zoster).  F. I certify that at the point of admission it is my clinical judgment that the patient will require inpatient hospital care spanning beyond 2 midnights from the point of admission and that early discharge would result in unnecessary risk of decompensation and  readmission or threat to life, limb or bodily function.

## 2015-11-28 NOTE — Progress Notes (Signed)
I assessed the patient and agree with the assessment and plan in the H&P without addition changes; please refer to earlier note for full assessment and plan. Appears improved from yesterday. Still significantly swollen. Continue IV acyclovir for rash and cefazolin IV for superinfection. Will reconsult ophthalmology (unsure if it was done overnight).  Cordelia Poche, MD [facility] 11/28/2015, 11:01 AM Pager: [phone]

## 2015-11-28 NOTE — Progress Notes (Signed)
Pharmacy Antibiotic Note  Joann Gutierrez NEEDS is a 70 y.o. female admitted on 11/27/2015 with facial rash (suspected herpes zoster).  Pharmacy has been consulted for Acyclovir dosing. Pt also on Ancef for possible superinfection. Opthalmology consulted.  Plan: Acyclovir 800mg  (10mg /kg adjusted body wt) q12h Will f/u micro data, renal function, and pt's clinical condition  Height: 5\' 4"  (162.6 cm) Weight: 260 lb (117.9 kg) IBW/kg (Calculated) : 54.7  Temp (24hrs), Avg:99.5 F (37.5 C), Min:99.2 F (37.3 C), Max:99.8 F (37.7 C)   Recent Labs Lab 11/27/15 2149 11/27/15 2213  WBC 6.7  --   CREATININE 1.83*  --   LATICACIDVEN  --  1.13    Estimated Creatinine Clearance: 36.1 mL/min (by C-G formula based on SCr of 1.83 mg/dL (H)).    Allergies  Allergen Reactions  . Morphine And Related Other (See Comments)    Family request not to be given, reports pt does not wake up when given   . Promethazine Hcl Other (See Comments)    REACTION: lethargy    Antimicrobials this admission: 11/7 Acyclovir >>  11/7 Vanc x1 11/7 Ancef >>   Microbiology results: 11/6 BCx x2:   Thank you for allowing pharmacy to be a part of this patient's care.  Sherlon Handing, PharmD, BCPS Clinical pharmacist, pager [phone] 11/28/2015 1:50 AM

## 2015-11-29 ENCOUNTER — Ambulatory Visit: Payer: Self-pay | Admitting: *Deleted

## 2015-11-29 LAB — CBC WITH DIFFERENTIAL/PLATELET
BASOS ABS: 0 10*3/uL (ref 0.0–0.1)
Basophils Relative: 1 %
EOS PCT: 0 %
Eosinophils Absolute: 0 10*3/uL (ref 0.0–0.7)
HCT: 34.9 % — ABNORMAL LOW (ref 36.0–46.0)
HEMOGLOBIN: 11.1 g/dL — AB (ref 12.0–15.0)
LYMPHS ABS: 1.5 10*3/uL (ref 0.7–4.0)
LYMPHS PCT: 19 %
MCH: 23.5 pg — AB (ref 26.0–34.0)
MCHC: 31.8 g/dL (ref 30.0–36.0)
MCV: 73.8 fL — AB (ref 78.0–100.0)
Monocytes Absolute: 1.7 10*3/uL — ABNORMAL HIGH (ref 0.1–1.0)
Monocytes Relative: 21 %
NEUTROS ABS: 4.7 10*3/uL (ref 1.7–7.7)
NEUTROS PCT: 60 %
PLATELETS: 130 10*3/uL — AB (ref 150–400)
RBC: 4.73 MIL/uL (ref 3.87–5.11)
RDW: 16.4 % — ABNORMAL HIGH (ref 11.5–15.5)
WBC: 7.9 10*3/uL (ref 4.0–10.5)

## 2015-11-29 LAB — BASIC METABOLIC PANEL
ANION GAP: 12 (ref 5–15)
BUN: 32 mg/dL — ABNORMAL HIGH (ref 6–20)
CHLORIDE: 104 mmol/L (ref 101–111)
CO2: 23 mmol/L (ref 22–32)
Calcium: 8 mg/dL — ABNORMAL LOW (ref 8.9–10.3)
Creatinine, Ser: 2.13 mg/dL — ABNORMAL HIGH (ref 0.44–1.00)
GFR calc Af Amer: 26 mL/min — ABNORMAL LOW (ref >60)
GFR, EST NON AFRICAN AMERICAN: 22 mL/min — AB (ref >60)
GLUCOSE: 149 mg/dL — AB (ref 65–99)
POTASSIUM: 3.6 mmol/L (ref 3.5–5.1)
Sodium: 139 mmol/L (ref 135–145)

## 2015-11-29 LAB — GLUCOSE, CAPILLARY
Glucose-Capillary: 166 mg/dL — ABNORMAL HIGH (ref 65–99)
Glucose-Capillary: 168 mg/dL — ABNORMAL HIGH (ref 65–99)
Glucose-Capillary: 167 mg/dL — ABNORMAL HIGH (ref 65–99)
Glucose-Capillary: 222 mg/dL — ABNORMAL HIGH (ref 65–99)

## 2015-11-29 MED ORDER — BACITRACIN ZINC 500 UNIT/GM EX OINT
TOPICAL_OINTMENT | Freq: Two times a day (BID) | CUTANEOUS | Status: DC
  Administered 2015-11-29 – 2015-12-01 (×5): 31.5556 via TOPICAL
  Administered 2015-12-02 (×2): via TOPICAL
  Administered 2015-12-03: 1 via TOPICAL
  Administered 2015-12-03 – 2015-12-04 (×2): via TOPICAL
  Filled 2015-11-29 (×2): qty 28.35

## 2015-11-29 MED ORDER — ORAL CARE MOUTH RINSE
15.0000 mL | Freq: Two times a day (BID) | OROMUCOSAL | Status: DC
  Administered 2015-11-29 – 2015-12-04 (×10): 15 mL via OROMUCOSAL

## 2015-11-29 MED ORDER — GABAPENTIN 100 MG PO CAPS
200.0000 mg | ORAL_CAPSULE | Freq: Two times a day (BID) | ORAL | Status: DC
  Administered 2015-11-29 – 2015-11-30 (×2): 200 mg via ORAL
  Filled 2015-11-29 (×2): qty 2

## 2015-11-29 NOTE — Progress Notes (Signed)
Per report and upon initial shift assessment, pt very drowsy, opening eyes when name called but not following other commands at this time; MD aware during day, meds changed to IV if able; pt fiance still at bedside.    0110 husband called out as pt needed to use bathroom; pt alert and oriented x3 (pt knew month but not year), pt already wet but up to [facility] with 2 assist.  Patient quickly asleep again once back in bed; continue to monitor.

## 2015-11-29 NOTE — Consult Note (Addendum)
   [facility] CM Inpatient Consult   11/29/2015  NEVAYA NAGELE 1945-01-23 [id number]     Patient screened for [facility] Management services. Spoke with inpatient RNCM. Currently on droplet and contact isolation. Admitted on yesterday. Will follow up with patient at later time for potential [facility] Care Management services.   [name], MSN-Ed, RN,BSN [facility] Liaison [phone]

## 2015-11-29 NOTE — Progress Notes (Signed)
[facility]                                                                              Patient Demographics  Joann Gutierrez, is a 70 y.o. female, DOB - August 22, 1945, HKV:[id number]  Admit date - 11/27/2015   Admitting Physician Edwin Dada, MD  Outpatient Primary MD for the patient is Kandice Hams, MD  Outpatient specialists:   LOS - 1  days    Chief Complaint  Patient presents with  . Angioedema       Brief summary   Joann Gutierrez is a 70 y.o. female with a past medical history significant for COPD/asthma, gastroparesis, HFpEF, IDDM, HTN, CKD III baseline Cr 1.5, OSA on CPAP and noct O2, and anemia  who presents with face rash for 3 days. The patient was in her usual health until 72 hours ago when she developed aching pain in her left ear and eye as well as gums "aching".  Then Saturday she developed a rash on her left face, starting with a "bump" on her left nasolabial fold that her daughter thought was from her CPAP machine.  This progressed to numerous fluid-filled vesicles on her left cheek, nose, left upper lip, and roof of mouth.  Today, this kept progressing so they came to the ER. -CT maxillofacial protocol showed soft tissue swelling in the left face without abscess or periororbital cellulitis -She was given vancomycin for cellulitis And patient was admitted for further workup.   Assessment & Plan   Herpes zoster of B2 distribution with periorbital swelling, superimposed infection with mild blepharoconjunctivitis -  Possibly with some bacterial superinfection/impetigo.   - Continue IV acyclovir, cefazolin - Appreciate ophthalmology recommendations, recommended bacitracin ointment twice a day to affected areas  OSA:  -Continue CPAP at night with bleed in O2  CKD:  Stable, at baseline.    Gastroparesis:  -Continue Reglan TID and QHS   Anemia:  Anemia of chronic disease and renal disease.  Stable.  At  baseline. -Continue iron   HTN:  -Continue Imdur, losartan, hydralazine, Cardura -Continue statin, aspirin, Zetia  COPD/Asthma:  -Continue Symbicort as formulary alternative -Continue montelukast  IDDM: -Continue Lantus -SSI with meals   Chronic diastolic CHF: Appears euvolemic.  EF 60-65% last May. -Continue furosemide, K -Continue metolazone  Somnolence - Discontinue tramadol, decrease Neurontin - Hold by mouth meds if patient unable to tolerate oral meds  Code Status: Full CODE STATUS DVT Prophylaxis:  Lovenox  Family Communication: Discussed in detail with the patient, all imaging results, lab results explained to the patient and husband   Disposition Plan:   Time Spent in minutes   25 minutes  Procedures:    Consultants:    Ophthalmology  Antimicrobials:      Medications  Scheduled Meds: . acyclovir  10 mg/kg (Adjusted) Intravenous Q12H  . aspirin EC  81 mg Oral Daily  . atorvastatin  10 mg Oral Daily  .  ceFAZolin (ANCEF) IV  500 mg Intravenous Q12H  . cyclobenzaprine  10 mg Oral QHS  . donepezil  10 mg Oral QHS  . doxazosin  2 mg Oral QHS  .  enoxaparin (LOVENOX) injection  40 mg Subcutaneous Q24H  . ezetimibe  10 mg Oral QHS  . ferrous sulfate  325 mg Oral Q breakfast  . furosemide  80 mg Oral BID  . gabapentin  200 mg Oral BID  . hydrALAZINE  100 mg Oral TID  . insulin aspart  0-15 Units Subcutaneous TID WC  . insulin aspart  0-5 Units Subcutaneous QHS  . insulin glargine  15 Units Subcutaneous QHS  . isosorbide mononitrate  120 mg Oral Daily  . losartan  100 mg Oral Daily  . mouth rinse  15 mL Mouth Rinse BID  . metoCLOPramide (REGLAN) injection  10 mg Intravenous Q8H  . metolazone  2.5 mg Oral Once per day on Mon Wed Fri  . mometasone-formoterol  2 puff Inhalation BID  . montelukast  10 mg Oral Daily  . potassium chloride SA  20 mEq Oral Daily  . raloxifene  60 mg Oral Daily   Continuous Infusions: PRN Meds:.acetaminophen  **OR** acetaminophen, hydrALAZINE, ondansetron **OR** ondansetron (ZOFRAN) IV   Antibiotics   Anti-infectives    Start     Dose/Rate Route Frequency Ordered Stop   11/28/15 1200  acyclovir (ZOVIRAX) 800 mg in dextrose 5 % 150 mL IVPB     10 mg/kg  80 kg (Adjusted) 166 mL/hr over 60 Minutes Intravenous Every 12 hours 11/28/15 0050     11/28/15 1000  ceFAZolin (ANCEF) 500 mg in dextrose 5 % 100 mL IVPB     500 mg 210 mL/hr over 30 Minutes Intravenous Every 12 hours 11/28/15 0156     11/28/15 0100  acyclovir (ZOVIRAX) 800 mg in dextrose 5 % 150 mL IVPB     10 mg/kg  80 kg (Adjusted) 166 mL/hr over 60 Minutes Intravenous NOW 11/28/15 0050 11/28/15 0223   11/28/15 0045  valACYclovir (VALTREX) tablet 1,000 mg  Status:  Discontinued     1,000 mg Oral Daily 11/28/15 0036 11/28/15 0038   11/27/15 2200  vancomycin (VANCOCIN) IVPB 1000 mg/200 mL premix     1,000 mg 200 mL/hr over 60 Minutes Intravenous  Once 11/27/15 2150 11/28/15 0044        Subjective:   Joann Gutierrez was seen and examined today. No complaints, feeling slightly better this morning, more crusting noted on the rashes. Patient denies dizziness, chest pain, shortness of breath, abdominal pain, N/V/D/C, new weakness, numbess, tingling. No acute events overnight.    Objective:   Vitals:   11/29/15 0115 11/29/15 0553 11/29/15 0858 11/29/15 0905  BP: (!) 166/68 (!) 137/59 121/75   Pulse: 89 91  85  Resp: 20   18  Temp: 98.8 F (37.1 C) (!) 100.8 F (38.2 C)  (!) 100.4 F (38 C)  TempSrc: Oral Axillary  Oral  SpO2: 94% 97%  97%  Weight:      Height:        Intake/Output Summary (Last 24 hours) at 11/29/15 1141 Last data filed at 11/29/15 0330  Gross per 24 hour  Intake              542 ml  Output                0 ml  Net              542 ml     Wt Readings from Last 3 Encounters:  11/28/15 117.9 kg (259 lb 14.8 oz)  10/16/15 124.6 kg (274 lb 12.8 oz)  07/13/15 126.4 kg (278 lb 9.6 oz)  Exam  General: Somewhat more Alert and oriented x 2, NAD  HEENT:  PERRLA, EOMI, vesicles on the left nose, maxilla upper left lip with crusting  Neck: Supple, no JVD, no masses  Cardiovascular: S1 S2 auscultated, no rubs, murmurs or gallops. Regular rate and rhythm.  Respiratory: Clear to auscultation bilaterally, no wheezing, rales or rhonchi  Gastrointestinal: Soft, nontender, nondistended, + bowel sounds  Ext: no cyanosis clubbing or edema  Neuro: AAOx3, Cr N's II- XII. Strength 5/5 upper and lower extremities bilaterally  Skin: No rashes  Psych: Somewhat more alert today.    Data Reviewed:  I have personally reviewed following labs and imaging studies  Micro Results Recent Results (from the past 240 hour(s))  Culture, blood (routine x 2)     Status: None (Preliminary result)   Collection Time: 11/27/15 10:00 PM  Result Value Ref Range Status   Specimen Description BLOOD RIGHT ARM  Final   Special Requests BOTTLES DRAWN AEROBIC AND ANAEROBIC 5CC  Final   Culture NO GROWTH 2 DAYS  Final   Report Status PENDING  Incomplete  Culture, blood (routine x 2)     Status: None (Preliminary result)   Collection Time: 11/27/15 10:22 PM  Result Value Ref Range Status   Specimen Description BLOOD RIGHT HAND  Final   Special Requests BOTTLES DRAWN AEROBIC AND ANAEROBIC 5CC  Final   Culture NO GROWTH 2 DAYS  Final   Report Status PENDING  Incomplete    Radiology Reports Ct Maxillofacial Wo Contrast  Result Date: 11/27/2015 CLINICAL DATA:  70 y/o F; left-sided facial swelling with onset Friday is increasing and ulcer to tender skin on nose. EXAM: CT MAXILLOFACIAL WITHOUT CONTRAST TECHNIQUE: Multidetector CT imaging of the maxillofacial structures was performed. Multiplanar CT image reconstructions were also generated. A small metallic BB was placed on the right temple in order to reliably differentiate right from left. COMPARISON:  None. FINDINGS: Osseous: No fracture or  mandibular dislocation. No destructive process. Orbits: No intraorbital traumatic or inflammatory finding. Sinuses: Mild sphenoid and maxillary sinus mucosal thickening. Soft tissues: Centered within the soft tissues of the left nasolabial fold deep to dermal irregularities in the left aspect of the nose there is soft tissue thickening and induration (series 2, image 43). Surrounding this area extending superiorly into periorbital soft tissues, laterally over the malar subcutaneous fat, inferiorly into the left superior lip there is fat stranding and dermal thickening compatible with inflammatory change. No discrete low-attenuation fluid collection is identified. Limited intracranial: No significant or unexpected finding. Calcific atherosclerosis of carotid siphons. IMPRESSION: Inflammatory changes centered in the soft tissues of the left nasal ala and left nasal labial fold extending superiorly to left periorbital soft tissues, laterally over the cheek, and inferiorly into the left upper lip probably representing cellulitis. No discrete low-attenuation fluid collection is identified. No infiltration of the intraorbital space or deep cervical spaces. No osseous erosion. Electronically Signed   By: Kristine Garbe M.D.   On: 11/27/2015 22:58    Lab Data:  CBC:  Recent Labs Lab 11/24/15 1045 11/27/15 2149 11/29/15 0626  WBC  --  6.7 7.9  NEUTROABS  --  3.8 4.7  HGB 11.8* 11.7* 11.1*  HCT  --  36.8 34.9*  MCV  --  73.9* 73.8*  PLT  --  157 540*   Basic Metabolic Panel:  Recent Labs Lab 11/27/15 2149 11/29/15 0626  NA 141 139  K 4.0 3.6  CL 104 104  CO2 26 23  GLUCOSE  195* 149*  BUN 27* 32*  CREATININE 1.83* 2.13*  CALCIUM 8.6* 8.0*   GFR: Estimated Creatinine Clearance: 31 mL/min (by C-G formula based on SCr of 2.13 mg/dL (H)). Liver Function Tests:  Recent Labs Lab 11/27/15 2149  AST 16  ALT 9*  ALKPHOS 78  BILITOT 0.6  PROT 8.2*  ALBUMIN 3.5   No results for  input(s): LIPASE, AMYLASE in the last 168 hours. No results for input(s): AMMONIA in the last 168 hours. Coagulation Profile: No results for input(s): INR, PROTIME in the last 168 hours. Cardiac Enzymes: No results for input(s): CKTOTAL, CKMB, CKMBINDEX, TROPONINI in the last 168 hours. BNP (last 3 results) No results for input(s): PROBNP in the last 8760 hours. HbA1C: No results for input(s): HGBA1C in the last 72 hours. CBG:  Recent Labs Lab 11/28/15 0219 11/28/15 0625 11/29/15 0555  GLUCAP 238* 104* 166*   Lipid Profile: No results for input(s): CHOL, HDL, LDLCALC, TRIG, CHOLHDL, LDLDIRECT in the last 72 hours. Thyroid Function Tests: No results for input(s): TSH, T4TOTAL, FREET4, T3FREE, THYROIDAB in the last 72 hours. Anemia Panel: No results for input(s): VITAMINB12, FOLATE, FERRITIN, TIBC, IRON, RETICCTPCT in the last 72 hours. Urine analysis:    Component Value Date/Time   COLORURINE YELLOW 05/20/2015 0148   APPEARANCEUR CLEAR 05/20/2015 0148   LABSPEC 1.016 05/20/2015 0148   PHURINE 5.0 05/20/2015 0148   GLUCOSEU 100 (A) 05/20/2015 0148   GLUCOSEU >=1000 01/02/2009 1101   HGBUR NEGATIVE 05/20/2015 0148   HGBUR negative 03/25/2008 1022   BILIRUBINUR NEGATIVE 05/20/2015 0148   KETONESUR NEGATIVE 05/20/2015 0148   PROTEINUR 100 (A) 05/20/2015 0148   UROBILINOGEN 0.2 09/14/2013 1632   NITRITE NEGATIVE 05/20/2015 0148   LEUKOCYTESUR NEGATIVE 05/20/2015 0148     RAI,RIPUDEEP M.D. [facility] 11/29/2015, 11:41 AM  Pager: [phone] Between 7am to 7pm - call Pager - [phone]  After 7pm go to [email or web address] - password [email or web address]  Call night coverage person covering after 7pm

## 2015-11-30 DIAGNOSIS — R410 Disorientation, unspecified: Secondary | ICD-10-CM

## 2015-11-30 DIAGNOSIS — Z841 Family history of disorders of kidney and ureter: Secondary | ICD-10-CM

## 2015-11-30 DIAGNOSIS — Z885 Allergy status to narcotic agent status: Secondary | ICD-10-CM

## 2015-11-30 DIAGNOSIS — Z87891 Personal history of nicotine dependence: Secondary | ICD-10-CM

## 2015-11-30 DIAGNOSIS — Z8249 Family history of ischemic heart disease and other diseases of the circulatory system: Secondary | ICD-10-CM

## 2015-11-30 DIAGNOSIS — Z800 Family history of malignant neoplasm of digestive organs: Secondary | ICD-10-CM

## 2015-11-30 DIAGNOSIS — Z832 Family history of diseases of the blood and blood-forming organs and certain disorders involving the immune mechanism: Secondary | ICD-10-CM

## 2015-11-30 DIAGNOSIS — B028 Zoster with other complications: Secondary | ICD-10-CM

## 2015-11-30 DIAGNOSIS — Z833 Family history of diabetes mellitus: Secondary | ICD-10-CM

## 2015-11-30 DIAGNOSIS — Z8489 Family history of other specified conditions: Secondary | ICD-10-CM

## 2015-11-30 DIAGNOSIS — Z888 Allergy status to other drugs, medicaments and biological substances status: Secondary | ICD-10-CM

## 2015-11-30 LAB — GLUCOSE, CAPILLARY
GLUCOSE-CAPILLARY: 149 mg/dL — AB (ref 65–99)
Glucose-Capillary: 132 mg/dL — ABNORMAL HIGH (ref 65–99)
Glucose-Capillary: 169 mg/dL — ABNORMAL HIGH (ref 65–99)
Glucose-Capillary: 120 mg/dL — ABNORMAL HIGH (ref 65–99)

## 2015-11-30 MED ORDER — CYCLOBENZAPRINE HCL 10 MG PO TABS
5.0000 mg | ORAL_TABLET | Freq: Every day | ORAL | Status: DC
  Administered 2015-11-30: 5 mg via ORAL
  Filled 2015-11-30: qty 1

## 2015-11-30 MED ORDER — GABAPENTIN 100 MG PO CAPS
100.0000 mg | ORAL_CAPSULE | Freq: Two times a day (BID) | ORAL | Status: DC
  Administered 2015-11-30 – 2015-12-01 (×2): 100 mg via ORAL
  Filled 2015-11-30 (×2): qty 1

## 2015-11-30 NOTE — Consult Note (Signed)
[place] for [facility]       Reason for Consult: zoster    Referring Physician: Dr. Tana Coast  Principal Problem:   Herpes zoster with complication Active Problems:   Obstructive sleep apnea   Chronic obstructive asthma ([place])   Gastroparesis   CKD (chronic kidney disease), stage III   Essential hypertension   Insulin dependent diabetes mellitus (HCC)   Severe obesity (BMI >= 40) (HCC)   Anemia of chronic disease   Cellulitis of face   . acyclovir  10 mg/kg (Adjusted) Intravenous Q12H  . aspirin EC  81 mg Oral Daily  . atorvastatin  10 mg Oral Daily  . bacitracin   Topical BID  .  ceFAZolin (ANCEF) IV  500 mg Intravenous Q12H  . cyclobenzaprine  5 mg Oral QHS  . donepezil  10 mg Oral QHS  . doxazosin  2 mg Oral QHS  . enoxaparin (LOVENOX) injection  40 mg Subcutaneous Q24H  . ezetimibe  10 mg Oral QHS  . ferrous sulfate  325 mg Oral Q breakfast  . furosemide  80 mg Oral BID  . gabapentin  100 mg Oral BID  . hydrALAZINE  100 mg Oral TID  . insulin aspart  0-15 Units Subcutaneous TID WC  . insulin aspart  0-5 Units Subcutaneous QHS  . insulin glargine  15 Units Subcutaneous QHS  . isosorbide mononitrate  120 mg Oral Daily  . losartan  100 mg Oral Daily  . mouth rinse  15 mL Mouth Rinse BID  . metoCLOPramide (REGLAN) injection  10 mg Intravenous Q8H  . metolazone  2.5 mg Oral Once per day on Mon Wed Fri  . mometasone-formoterol  2 puff Inhalation BID  . montelukast  10 mg Oral Daily  . potassium chloride SA  20 mEq Oral Daily  . raloxifene  60 mg Oral Daily    Recommendations: Continue with acyclovir and cefazolin  Dr. Megan Salon will follow up tomorrow  Will check HIV screen   Assessment: She has herpes zoster of V2 but with no current eye complaints including no eye pain, no erythema at this time, some blurred vision that she equates to ointment in her eye.   On acyclovir and keflex for possible superinfection.  She has had some confusion but is  improving with reduction of sedating medications so I do not think this represents encephalitis since she is near her baseline after removing medications.   Antibiotics: acyclovir  HPI: Joann Gutierrez is a 70 y.o. female with COPD, HFpEF, diabetes who for about 3 days prior to presentation noted aching pain in left ear and near eye and in mouth and subsequently developed vesicles on her cheek, nose, lip and roof of mouth. Also had noted blepharoconjunctivitis. She has not specific eye complaints other than blurriness associated with ointment.  Fever to 101.  Husband at bedside and tells me the lesions are much improved and she is more awake.  Did get the shingles vaccine her husband believes but no record.  Also noted to have surrounding erythema and started on antibacterials first with vancomycin and currently with cefazolin.     Review of Systems:  Constitutional: negative for anorexia and weight loss Respiratory: negative for cough Cardiovascular: negative for dyspnea All other systems reviewed and are negative    Past Medical History:  Diagnosis Date  . Adenomatous colon polyp   . Allergy   . Anemia   . Asthma       . CAD (  coronary artery disease)    Mild very minimal coronary disease with 20% obtuse marginal stenosis  . Carpal tunnel syndrome on left   . CHF (congestive heart failure) ([place])   . Chronic kidney disease (CKD), stage III (moderate)   . COPD (chronic obstructive pulmonary disease) ([place])   . CVA (cerebral infarction)   . Depression   . 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 Diverticulosis   . 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 Esophageal dysmotility   . Esophageal stricture   . Gastritis   . Gastroparesis 08/21/2007  . GERD (gastroesophageal reflux disease)   . Hair loss   . Hearing loss of both ears   . Hernia, hiatal   . Hyperlipidemia   . Hypertension   . Morbid obesity ([place])   . Osteoarthritis   . OSTEOARTHRITIS 08/09/2006  .  Osteoporosis   . 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 RENAL INSUFFICIENCY 02/16/2009  . 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 Seizures ([place])    pt thinks it has been several monthes since she had a seisure  . Sickle cell trait ([place])   . Stroke ([place])   . Tubular adenoma of colon     Social History  Substance Use Topics  . Smoking status: Former Smoker    Packs/day: 0.50    Years: 10.00    Quit date: 04/18/1980  . Smokeless tobacco: Never Used  . Alcohol use No    Family History  Problem Relation Age of Onset  . Liver cancer Mother     Liver Cancer  . Diabetes Mother   . Kidney disease Mother   . Heart disease Mother     age 19's  . Heart disease Father 79    MI  . Heart attack Father     died of MI when pt was 28  . Sickle cell anemia Father   . Colon cancer Brother   . Cancer Brother     Colon Cancer  . Diabetes Sister   . Kidney disease Sister   . Heart disease Sister     age 81's  . Allergies Sister   . Diabetes Sister   . Kidney disease Sister   . Heart disease Sister     age 5's  . Esophageal cancer Neg Hx   . Rectal cancer Neg Hx   . Stomach cancer Neg Hx     Allergies  Allergen Reactions  . Morphine And Related Other (See Comments)    Family request not to be given, reports pt does not wake up when given   . Promethazine Hcl Other (See Comments)    REACTION: lethargy    Physical Exam: Constitutional: in no apparent distress  Vitals:   11/30/15 1018 11/30/15 1254  BP: (!) 106/57 (!) 107/49  Pulse: 86 85  Resp: 18 18  Temp: 97.9 F (36.6 C) 97.8 F (36.6 C)   EYES: mild conjunctivitis of left eye; some eyelid edema  ENMT: left side of face with crusting areas/lesions  Cardiovascular: Cor RRR Respiratory: CTA B; normal respiratory effort GI: Bowel sounds are normal, liver is not enlarged, spleen is not enlarged Musculoskeletal: no pedal edema noted Skin: negatives: no rash otherwise Hematologic: no cervical lad  Lab Results    Component Value Date   WBC 7.9 11/29/2015   HGB 11.1 (L) 11/29/2015   HCT 34.9 (L) 11/29/2015   MCV 73.8 (L) 11/29/2015   PLT 130 (L) 11/29/2015  Lab Results  Component Value Date   CREATININE 2.13 (H) 11/29/2015   BUN 32 (H) 11/29/2015   NA 139 11/29/2015   K 3.6 11/29/2015   CL 104 11/29/2015   CO2 23 11/29/2015    Lab Results  Component Value Date   ALT 9 (L) 11/27/2015   AST 16 11/27/2015   ALKPHOS 78 11/27/2015     Microbiology: Recent Results (from the past 240 hour(s))  Culture, blood (routine x 2)     Status: None (Preliminary result)   Collection Time: 11/27/15 10:00 PM  Result Value Ref Range Status   Specimen Description BLOOD RIGHT ARM  Final   Special Requests BOTTLES DRAWN AEROBIC AND ANAEROBIC 5CC  Final   Culture NO GROWTH 2 DAYS  Final   Report Status PENDING  Incomplete  Culture, blood (routine x 2)     Status: None (Preliminary result)   Collection Time: 11/27/15 10:22 PM  Result Value Ref Range Status   Specimen Description BLOOD RIGHT HAND  Final   Special Requests BOTTLES DRAWN AEROBIC AND ANAEROBIC 5CC  Final   Culture NO GROWTH 2 DAYS  Final   Report Status PENDING  Incomplete    Scharlene Gloss, [place] [facility] [email or web address] [phone] pager  [phone] cell 11/30/2015, 2:27 PM

## 2015-11-30 NOTE — Care Management Note (Signed)
Case Management Note  Patient Details  Name: Joann Gutierrez MRN: [id number] Date of Birth: 11/08/1945  Subjective/Objective:  Pt admitted with herpes zoster with complication. She is from home.                   Action/Plan: CM following for discharge disposition when patient is medically stable.   Expected Discharge Date:                  Expected Discharge Plan:     In-House Referral:     Discharge planning Services     Post Acute Care Choice:    Choice offered to:     DME Arranged:    DME Agency:     HH Arranged:    HH Agency:     Status of Service:  In process, will continue to follow  If discussed at Long Length of Stay Meetings, dates discussed:    Additional Comments:  Pollie Friar, RN 11/30/2015, 11:30 AM

## 2015-11-30 NOTE — Progress Notes (Addendum)
[facility]                                                                              Patient Demographics  Joann Gutierrez, is a 70 y.o. female, DOB - 1945/03/14, BMW:[id number]  Admit date - 11/27/2015   Admitting Physician Edwin Dada, MD  Outpatient Primary MD for the patient is Kandice Hams, MD  Outpatient specialists:   LOS - 2  days    Chief Complaint  Patient presents with  . Angioedema       Brief summary   Joann Gutierrez is a 70 y.o. female with a past medical history significant for COPD/asthma, gastroparesis, HFpEF, IDDM, HTN, CKD III baseline Cr 1.5, OSA on CPAP and noct O2, and anemia  who presents with face rash for 3 days. The patient was in her usual health until 72 hours ago when she developed aching pain in her left ear and eye as well as gums "aching".  Then Saturday she developed a rash on her left face, starting with a "bump" on her left nasolabial fold that her daughter thought was from her CPAP machine.  This progressed to numerous fluid-filled vesicles on her left cheek, nose, left upper lip, and roof of mouth.  Today, this kept progressing so they came to the ER. -CT maxillofacial protocol showed soft tissue swelling in the left face without abscess or periororbital cellulitis -She was given vancomycin for cellulitis And patient was admitted for further workup.   Assessment & Plan   Herpes zoster of V2 distribution with periorbital swelling, superimposed infection with mild blepharoconjunctivitis -  Possibly with some bacterial superinfection/impetigo. Appears to be improving   - Continue IV acyclovir, cefazolin - Appreciate ophthalmology recommendations, recommended bacitracin ointment twice a day to affected areas - will cut down Neurontin, Flexeril. I had to discontinue tramadol, will request ophthalmology to reevaluate once patient becomes more awake and oriented.  - Requested ID consult for evaluation,  discussed with Dr. Linus Salmons  OSA:  -Continue CPAP at night with bleed in O2  CKD:  Stable, at baseline.    Gastroparesis:  -Continue Reglan TID and QHS   Anemia:  Anemia of chronic disease and renal disease.  Stable.  At baseline. -Continue iron   HTN:  -Continue Imdur, losartan, hydralazine, Cardura -Continue statin, aspirin, Zetia  COPD/Asthma:  -Continue Symbicort as formulary alternative -Continue montelukast  IDDM: -Continue Lantus -SSI with meals   Chronic diastolic CHF: Appears euvolemic.  EF 60-65% last May. -Continue furosemide, K -Continue metolazone  Somnolence - Discontinue tramadol, decrease Neurontin - Hold by mouth meds if patient unable to tolerate oral meds  Code Status: Full CODE STATUS DVT Prophylaxis:  Lovenox  Family Communication: Discussed in detail with the patient, all imaging results, lab results explained to the patient and husband   Disposition Plan:   Time Spent in minutes   15 minutes  Procedures:    Consultants:    Ophthalmology  Antimicrobials:      Medications  Scheduled Meds: . acyclovir  10 mg/kg (Adjusted) Intravenous Q12H  . aspirin EC  81 mg Oral Daily  . atorvastatin  10 mg  Oral Daily  . bacitracin   Topical BID  .  ceFAZolin (ANCEF) IV  500 mg Intravenous Q12H  . cyclobenzaprine  5 mg Oral QHS  . donepezil  10 mg Oral QHS  . doxazosin  2 mg Oral QHS  . enoxaparin (LOVENOX) injection  40 mg Subcutaneous Q24H  . ezetimibe  10 mg Oral QHS  . ferrous sulfate  325 mg Oral Q breakfast  . furosemide  80 mg Oral BID  . gabapentin  100 mg Oral BID  . hydrALAZINE  100 mg Oral TID  . insulin aspart  0-15 Units Subcutaneous TID WC  . insulin aspart  0-5 Units Subcutaneous QHS  . insulin glargine  15 Units Subcutaneous QHS  . isosorbide mononitrate  120 mg Oral Daily  . losartan  100 mg Oral Daily  . mouth rinse  15 mL Mouth Rinse BID  . metoCLOPramide (REGLAN) injection  10 mg Intravenous Q8H  .  metolazone  2.5 mg Oral Once per day on Mon Wed Fri  . mometasone-formoterol  2 puff Inhalation BID  . montelukast  10 mg Oral Daily  . potassium chloride SA  20 mEq Oral Daily  . raloxifene  60 mg Oral Daily   Continuous Infusions: PRN Meds:.acetaminophen **OR** acetaminophen, hydrALAZINE, ondansetron **OR** ondansetron (ZOFRAN) IV   Antibiotics   Anti-infectives    Start     Dose/Rate Route Frequency Ordered Stop   11/28/15 1200  acyclovir (ZOVIRAX) 800 mg in dextrose 5 % 150 mL IVPB     10 mg/kg  80 kg (Adjusted) 166 mL/hr over 60 Minutes Intravenous Every 12 hours 11/28/15 0050     11/28/15 1000  ceFAZolin (ANCEF) 500 mg in dextrose 5 % 100 mL IVPB     500 mg 210 mL/hr over 30 Minutes Intravenous Every 12 hours 11/28/15 0156     11/28/15 0100  acyclovir (ZOVIRAX) 800 mg in dextrose 5 % 150 mL IVPB     10 mg/kg  80 kg (Adjusted) 166 mL/hr over 60 Minutes Intravenous NOW 11/28/15 0050 11/28/15 0223   11/28/15 0045  valACYclovir (VALTREX) tablet 1,000 mg  Status:  Discontinued     1,000 mg Oral Daily 11/28/15 0036 11/28/15 0038   11/27/15 2200  vancomycin (VANCOCIN) IVPB 1000 mg/200 mL premix     1,000 mg 200 mL/hr over 60 Minutes Intravenous  Once 11/27/15 2150 11/28/15 0044        Subjective:   Joann Gutierrez was seen and examined today. Still somewhat somnolent. Rashes are improving on the face, more crusting noted on the rashes. Patient denies dizziness, chest pain, shortness of breath, abdominal pain, N/V/D/C, new weakness, numbess, tingling. No acute events overnight.    Objective:   Vitals:   11/30/15 0624 11/30/15 0758 11/30/15 0848 11/30/15 1018  BP: 117/60  (!) 97/57 (!) 106/57  Pulse: (!) 105  93 86  Resp:   20 18  Temp: (!) 101.5 F (38.6 C) (!) 100.4 F (38 C) 99.5 F (37.5 C) 97.9 F (36.6 C)  TempSrc: Axillary Axillary Oral Axillary  SpO2: 96%  97% 98%  Weight:      Height:        Intake/Output Summary (Last 24 hours) at 11/30/15 1239 Last  data filed at 11/30/15 0600  Gross per 24 hour  Intake              542 ml  Output  0 ml  Net              542 ml     Wt Readings from Last 3 Encounters:  11/28/15 117.9 kg (259 lb 14.8 oz)  10/16/15 124.6 kg (274 lb 12.8 oz)  07/13/15 126.4 kg (278 lb 9.6 oz)     Exam  General: Somnolent, NAD  HEENT:  PERRLA, EOMI, vesicles on the left nose, maxilla upper left lip with crusting  Neck:   Cardiovascular: S1 S2 clear, RRR  Respiratory: CTAB  Gastrointestinal: Soft, nontender, nondistended, + bowel sounds  Ext: no cyanosis clubbing or edema  Neuro: did not cooperate  Skin: as above   Psych: Somnolent   Data Reviewed:  I have personally reviewed following labs and imaging studies  Micro Results Recent Results (from the past 240 hour(s))  Culture, blood (routine x 2)     Status: None (Preliminary result)   Collection Time: 11/27/15 10:00 PM  Result Value Ref Range Status   Specimen Description BLOOD RIGHT ARM  Final   Special Requests BOTTLES DRAWN AEROBIC AND ANAEROBIC 5CC  Final   Culture NO GROWTH 2 DAYS  Final   Report Status PENDING  Incomplete  Culture, blood (routine x 2)     Status: None (Preliminary result)   Collection Time: 11/27/15 10:22 PM  Result Value Ref Range Status   Specimen Description BLOOD RIGHT HAND  Final   Special Requests BOTTLES DRAWN AEROBIC AND ANAEROBIC 5CC  Final   Culture NO GROWTH 2 DAYS  Final   Report Status PENDING  Incomplete    Radiology Reports Ct Maxillofacial Wo Contrast  Result Date: 11/27/2015 CLINICAL DATA:  70 y/o F; left-sided facial swelling with onset Friday is increasing and ulcer to tender skin on nose. EXAM: CT MAXILLOFACIAL WITHOUT CONTRAST TECHNIQUE: Multidetector CT imaging of the maxillofacial structures was performed. Multiplanar CT image reconstructions were also generated. A small metallic BB was placed on the right temple in order to reliably differentiate right from left. COMPARISON:   None. FINDINGS: Osseous: No fracture or mandibular dislocation. No destructive process. Orbits: No intraorbital traumatic or inflammatory finding. Sinuses: Mild sphenoid and maxillary sinus mucosal thickening. Soft tissues: Centered within the soft tissues of the left nasolabial fold deep to dermal irregularities in the left aspect of the nose there is soft tissue thickening and induration (series 2, image 43). Surrounding this area extending superiorly into periorbital soft tissues, laterally over the malar subcutaneous fat, inferiorly into the left superior lip there is fat stranding and dermal thickening compatible with inflammatory change. No discrete low-attenuation fluid collection is identified. Limited intracranial: No significant or unexpected finding. Calcific atherosclerosis of carotid siphons. IMPRESSION: Inflammatory changes centered in the soft tissues of the left nasal ala and left nasal labial fold extending superiorly to left periorbital soft tissues, laterally over the cheek, and inferiorly into the left upper lip probably representing cellulitis. No discrete low-attenuation fluid collection is identified. No infiltration of the intraorbital space or deep cervical spaces. No osseous erosion. Electronically Signed   By: Kristine Garbe M.D.   On: 11/27/2015 22:58    Lab Data:  CBC:  Recent Labs Lab 11/24/15 1045 11/27/15 2149 11/29/15 0626  WBC  --  6.7 7.9  NEUTROABS  --  3.8 4.7  HGB 11.8* 11.7* 11.1*  HCT  --  36.8 34.9*  MCV  --  73.9* 73.8*  PLT  --  157 465*   Basic Metabolic Panel:  Recent Labs Lab 11/27/15 2149 11/29/15  0626  NA 141 139  K 4.0 3.6  CL 104 104  CO2 26 23  GLUCOSE 195* 149*  BUN 27* 32*  CREATININE 1.83* 2.13*  CALCIUM 8.6* 8.0*   GFR: Estimated Creatinine Clearance: 31 mL/min (by C-G formula based on SCr of 2.13 mg/dL (H)). Liver Function Tests:  Recent Labs Lab 11/27/15 2149  AST 16  ALT 9*  ALKPHOS 78  BILITOT 0.6  PROT  8.2*  ALBUMIN 3.5   No results for input(s): LIPASE, AMYLASE in the last 168 hours. No results for input(s): AMMONIA in the last 168 hours. Coagulation Profile: No results for input(s): INR, PROTIME in the last 168 hours. Cardiac Enzymes: No results for input(s): CKTOTAL, CKMB, CKMBINDEX, TROPONINI in the last 168 hours. BNP (last 3 results) No results for input(s): PROBNP in the last 8760 hours. HbA1C: No results for input(s): HGBA1C in the last 72 hours. CBG:  Recent Labs Lab 11/29/15 1235 11/29/15 1703 11/29/15 2057 11/30/15 0623 11/30/15 1151  GLUCAP 168* 167* 222* 149* 132*   Lipid Profile: No results for input(s): CHOL, HDL, LDLCALC, TRIG, CHOLHDL, LDLDIRECT in the last 72 hours. Thyroid Function Tests: No results for input(s): TSH, T4TOTAL, FREET4, T3FREE, THYROIDAB in the last 72 hours. Anemia Panel: No results for input(s): VITAMINB12, FOLATE, FERRITIN, TIBC, IRON, RETICCTPCT in the last 72 hours. Urine analysis:    Component Value Date/Time   COLORURINE YELLOW 05/20/2015 0148   APPEARANCEUR CLEAR 05/20/2015 0148   LABSPEC 1.016 05/20/2015 0148   PHURINE 5.0 05/20/2015 0148   GLUCOSEU 100 (A) 05/20/2015 0148   GLUCOSEU >=1000 01/02/2009 1101   HGBUR NEGATIVE 05/20/2015 0148   HGBUR negative 03/25/2008 1022   BILIRUBINUR NEGATIVE 05/20/2015 0148   KETONESUR NEGATIVE 05/20/2015 0148   PROTEINUR 100 (A) 05/20/2015 0148   UROBILINOGEN 0.2 09/14/2013 1632   NITRITE NEGATIVE 05/20/2015 0148   LEUKOCYTESUR NEGATIVE 05/20/2015 0148     RAI,RIPUDEEP M.D. [facility] 11/30/2015, 12:39 PM  Pager: [phone] Between 7am to 7pm - call Pager - [phone]  After 7pm go to [email or web address] - password [email or web address]  Call night coverage person covering after 7pm

## 2015-12-01 ENCOUNTER — Encounter (HOSPITAL_COMMUNITY): Payer: Medicare Other

## 2015-12-01 DIAGNOSIS — R5383 Other fatigue: Secondary | ICD-10-CM

## 2015-12-01 DIAGNOSIS — R509 Fever, unspecified: Secondary | ICD-10-CM

## 2015-12-01 LAB — BASIC METABOLIC PANEL
Anion gap: 10 (ref 5–15)
BUN: 63 mg/dL — AB (ref 6–20)
CALCIUM: 7.9 mg/dL — AB (ref 8.9–10.3)
CO2: 22 mmol/L (ref 22–32)
Chloride: 104 mmol/L (ref 101–111)
Creatinine, Ser: 3.99 mg/dL — ABNORMAL HIGH (ref 0.44–1.00)
GFR calc Af Amer: 12 mL/min — ABNORMAL LOW (ref >60)
GFR, EST NON AFRICAN AMERICAN: 10 mL/min — AB (ref >60)
GLUCOSE: 129 mg/dL — AB (ref 65–99)
POTASSIUM: 4.1 mmol/L (ref 3.5–5.1)
SODIUM: 136 mmol/L (ref 135–145)

## 2015-12-01 LAB — GLUCOSE, CAPILLARY
GLUCOSE-CAPILLARY: 204 mg/dL — AB (ref 65–99)
GLUCOSE-CAPILLARY: 124 mg/dL — AB (ref 65–99)
Glucose-Capillary: 215 mg/dL — ABNORMAL HIGH (ref 65–99)
Glucose-Capillary: 139 mg/dL — ABNORMAL HIGH (ref 65–99)

## 2015-12-01 LAB — HIV ANTIBODY (ROUTINE TESTING W REFLEX): HIV Screen 4th Generation wRfx: NONREACTIVE

## 2015-12-01 MED ORDER — ENSURE ENLIVE PO LIQD
237.0000 mL | Freq: Two times a day (BID) | ORAL | Status: DC
  Administered 2015-12-01 – 2015-12-04 (×6): 237 mL via ORAL

## 2015-12-01 MED ORDER — SODIUM CHLORIDE 0.9 % IV SOLN
INTRAVENOUS | Status: DC
  Administered 2015-12-01 – 2015-12-02 (×2): via INTRAVENOUS

## 2015-12-01 MED ORDER — DEXTROSE 5 % IV SOLN
10.0000 mg/kg | INTRAVENOUS | Status: DC
  Administered 2015-12-02: 800 mg via INTRAVENOUS
  Filled 2015-12-01: qty 16

## 2015-12-01 NOTE — Consult Note (Signed)
Neurology Consultation Reason for Consult: Lethargy Referring Physician: ai, R  CC: Lethargy  History is obtained from: Patient  HPI: Joann Gutierrez is a 70 y.o. female admitted for facial herpes zoster with possible impetigo superimposed. Over the past few days, she has been noticed to get progressively more lethargic and mildly confused. She apparently has some waxing/waning of her mental status.  Nursing notes that since they got her up to a chair, she has seemed to be much more awake.      ROS: A 14 point ROS was performed and is negative except as noted in the HPI.   Past Medical History:  Diagnosis Date  . Adenomatous colon polyp   . Allergy   . Anemia   . Asthma       . CAD (coronary artery disease)    Mild very minimal coronary disease with 20% obtuse marginal stenosis  . Carpal tunnel syndrome on left   . CHF (congestive heart failure) ([place])   . Chronic kidney disease (CKD), stage III (moderate)   . COPD (chronic obstructive pulmonary disease) ([place])   . CVA (cerebral infarction)   . Depression   . 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 Diverticulosis   . 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 Esophageal dysmotility   . Esophageal stricture   . Gastritis   . Gastroparesis 08/21/2007  . GERD (gastroesophageal reflux disease)   . Hair loss   . Hearing loss of both ears   . Hernia, hiatal   . Hyperlipidemia   . Hypertension   . Morbid obesity ([place])   . Osteoarthritis   . OSTEOARTHRITIS 08/09/2006  . Osteoporosis   . 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 RENAL INSUFFICIENCY 02/16/2009  . 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 Seizures ([place])    pt thinks it has been several monthes since she had a seisure  . Sickle cell trait ([name])   . Stroke ([place])   . Tubular adenoma of colon      Family History  Problem Relation Age of Onset  . Liver cancer Mother     Liver Cancer  . Diabetes Mother   . Kidney disease Mother   . Heart disease  Mother     age 58's  . Heart disease Father 23    MI  . Heart attack Father     died of MI when pt was 4  . Sickle cell anemia Father   . Colon cancer Brother   . Cancer Brother     Colon Cancer  . Diabetes Sister   . Kidney disease Sister   . Heart disease Sister     age 83's  . Allergies Sister   . Diabetes Sister   . Kidney disease Sister   . Heart disease Sister     age 26's  . Esophageal cancer Neg Hx   . Rectal cancer Neg Hx   . Stomach cancer Neg Hx      Social History:  reports that she quit smoking about 35 years ago. She has a 5.00 pack-year smoking history. She has never used smokeless tobacco. She reports that she does not drink alcohol or use drugs.   Exam: Current vital signs: BP (!) 101/48 (BP Location: Left Arm)   Pulse 88   Temp 97.2 F (36.2 C) (Tympanic)   Resp 18   Ht 5\' 4"  (1.626 m)   Wt 117.9 kg (259 lb 14.8 oz)   SpO2  96%   BMI 44.62 kg/m  Vital signs in last 24 hours: Temp:  [97.2 F (36.2 C)-102.4 F (39.1 C)] 97.2 F (36.2 C) (11/10 1625) Pulse Rate:  [84-118] 88 (11/10 1625) Resp:  [18-20] 18 (11/10 1625) BP: (101-138)/(45-56) 101/48 (11/10 1625) SpO2:  [94 %-97 %] 96 % (11/10 1625)   Physical Exam  Constitutional: Appears well-developed and well-nourished.  Psych: Affect appropriate to situation Eyes: No scleral injection HENT: She has a rash on the left side of her face in the V2 distribution Head: Normocephalic.  Cardiovascular: Normal rate and regular rhythm.  Respiratory: Effort normal and breath sounds normal to anterior ascultation GI: Soft.  No distension. There is no tenderness.  Skin: WDI  Neuro: Mental Status: Patient is awake, alert, oriented to person, place,  year, and situation. She gives the month initially of September, but then corrects herself to November. Cranial Nerves: II: Visual Fields are full. Pupils are equal, round, and reactive to light.   III,IV, VI: EOMI without ptosis or diploplia.  V: Facial  sensation is not tested due to rash. VII: Facial movement is symmetric.  VIII: hearing is intact to voice X: Uvula elevates symmetrically XI: Shoulder shrug is symmetric. XII: tongue is midline without atrophy or fasciculations.  Motor: Tone is normal. Bulk is normal. 5/5 strength was present in all four extremities.  Sensory: Sensation is symmetric to light touch and temperature in the arms and legs. Cerebellar: FNF  intact bilaterally   I have reviewed labs in epic and the results pertinent to this consultation are: Elevated creatinine at 4  I have reviewed the images obtained: CT face-visible cranial contents appear relatively unremarkable.  Impression: 70 year old female with a history of facial herpes zoster receiving IV acyclovir and developing acute kidney injury. I suspect that her lethargy is related to her renal dysfunction coupled with a fairly high dose of acyclovir.  At this time, she is already receiving a dose of acyclovir that would be therapeutic for herpes zoster encephalitis, and I have very low suspicion for this at this time. I would favor reducing dose of acyclovir and addressing renal dysfunction.  If she were markedly worsen, then could consider lumbar puncture but I don't think that this would be likely to change management or be high yield at this time.  Recommendations: 1) renally dose acyclovir  2) if the patient does markedly worsen, please call and neurology can reevaluate 3) please call with any further questions or concerns, neurology to sign off.   Roland Rack, MD [facility] [phone]  If 7pm- 7am, please page neurology on call as listed in [place].

## 2015-12-01 NOTE — Progress Notes (Addendum)
[facility]                                                                              Patient Demographics  Joann Gutierrez, is a 70 y.o. female, DOB - [birth year], RCV:[id number]  Admit date - 11/27/2015   Admitting Physician Edwin Dada, MD  Outpatient Primary MD for the patient is Kandice Hams, MD  Outpatient specialists:   LOS - 3  days    Chief Complaint  Patient presents with  . Angioedema       Brief summary   Joann Gutierrez is a 70 y.o. female with a past medical history significant for COPD/asthma, gastroparesis, HFpEF, IDDM, HTN, CKD III baseline Cr 1.5, OSA on CPAP and noct O2, and anemia  who presents with face rash for 3 days. The patient was in her usual health until 72 hours ago when she developed aching pain in her left ear and eye as well as gums "aching".  Then Saturday she developed a rash on her left face, starting with a "bump" on her left nasolabial fold that her daughter thought was from her CPAP machine.  This progressed to numerous fluid-filled vesicles on her left cheek, nose, left upper lip, and roof of mouth.  Today, this kept progressing so they came to the ER. -CT maxillofacial protocol showed soft tissue swelling in the left face without abscess or periororbital cellulitis -She was given vancomycin for cellulitis And patient was admitted for further workup.   Assessment & Plan   Herpes zoster of V2 distribution with periorbital swelling, superimposed infection with mild blepharoconjunctivitis -  Possibly with some bacterial superinfection/impetigo.The rash appears to be improving   - Appreciate ophthalmology recommendations, recommended bacitracin ointment twice a day to affected areas - Appreciate ID recommendations, will continue acyclovir and cefazolin  Acute encephalopathy ? Herpes encephalitis - All sedating medications have been tapered down and discontinued however patient continues to be very  somnolent.  - Mental status has been waxing and waning, will obtain MRI of the brain for further evaluation, rule out herpes encephalitis. Discussed with neurology, Dr. Leonel Ramsay, will also need spinal tap. I have discontinued Lovenox. Last dose was on 11/9.     OSA:  -Continue CPAP at night with bleed in O2   Acute on chronic CKD:  - Creatinine trending up, patient has been very somnolent, not eating and on several diuretics and nephrotoxic medications - Discontinued Lasix, metolazone, Reglan, and losartan, placed on IV fluids -Follow BMET closely   Gastroparesis:  -Continue Reglan TID and QHS   Anemia:  Anemia of chronic disease and renal disease.  Stable.  At baseline. -Continue iron   HTN:  -Continue Imdur, losartan, hydralazine, Cardura -Continue statin, aspirin, Zetia  COPD/Asthma:  -Continue Symbicort as formulary alternative -Continue montelukast  IDDM: -Continue Lantus -SSI with meals   Chronic diastolic CHF: Appears euvolemic.  EF 60-65% last May. -Continue furosemide, K -Continue metolazone  Somnolence - Discontinue tramadol, decrease Neurontin - Hold by mouth meds if patient unable to tolerate oral meds  Code Status: Full CODE STATUS DVT Prophylaxis:  Lovenox  Family Communication: Discussed in detail with the  patient, all imaging results, lab results explained to the patient and husband this am   Disposition Plan:   Time Spent in minutes   25 minutes  Procedures:    Consultants:    Ophthalmology  Antimicrobials:      Medications  Scheduled Meds: . acyclovir  10 mg/kg (Adjusted) Intravenous Q12H  . aspirin EC  81 mg Oral Daily  . atorvastatin  10 mg Oral Daily  . bacitracin   Topical BID  .  ceFAZolin (ANCEF) IV  500 mg Intravenous Q12H  . cyclobenzaprine  5 mg Oral QHS  . donepezil  10 mg Oral QHS  . doxazosin  2 mg Oral QHS  . enoxaparin (LOVENOX) injection  40 mg Subcutaneous Q24H  . ezetimibe  10 mg Oral QHS  .  ferrous sulfate  325 mg Oral Q breakfast  . furosemide  80 mg Oral BID  . gabapentin  100 mg Oral BID  . hydrALAZINE  100 mg Oral TID  . insulin aspart  0-15 Units Subcutaneous TID WC  . insulin aspart  0-5 Units Subcutaneous QHS  . insulin glargine  15 Units Subcutaneous QHS  . isosorbide mononitrate  120 mg Oral Daily  . losartan  100 mg Oral Daily  . mouth rinse  15 mL Mouth Rinse BID  . metoCLOPramide (REGLAN) injection  10 mg Intravenous Q8H  . metolazone  2.5 mg Oral Once per day on Mon Wed Fri  . mometasone-formoterol  2 puff Inhalation BID  . montelukast  10 mg Oral Daily  . potassium chloride SA  20 mEq Oral Daily  . raloxifene  60 mg Oral Daily   Continuous Infusions: PRN Meds:.acetaminophen **OR** acetaminophen, hydrALAZINE, ondansetron **OR** ondansetron (ZOFRAN) IV   Antibiotics   Anti-infectives    Start     Dose/Rate Route Frequency Ordered Stop   11/28/15 1200  acyclovir (ZOVIRAX) 800 mg in dextrose 5 % 150 mL IVPB     10 mg/kg  80 kg (Adjusted) 166 mL/hr over 60 Minutes Intravenous Every 12 hours 11/28/15 0050     11/28/15 1000  ceFAZolin (ANCEF) 500 mg in dextrose 5 % 100 mL IVPB     500 mg 210 mL/hr over 30 Minutes Intravenous Every 12 hours 11/28/15 0156     11/28/15 0100  acyclovir (ZOVIRAX) 800 mg in dextrose 5 % 150 mL IVPB     10 mg/kg  80 kg (Adjusted) 166 mL/hr over 60 Minutes Intravenous NOW 11/28/15 0050 11/28/15 0223   11/28/15 0045  valACYclovir (VALTREX) tablet 1,000 mg  Status:  Discontinued     1,000 mg Oral Daily 11/28/15 0036 11/28/15 0038   11/27/15 2200  vancomycin (VANCOCIN) IVPB 1000 mg/200 mL premix     1,000 mg 200 mL/hr over 60 Minutes Intravenous  Once 11/27/15 2150 11/28/15 0044        Subjective:   [name] was seen and examined today. Very somnolent, mildly able to arouse with loud noise and shaking. Then falls right back to sleep. Overnight still spiking fevers Tmax 102.2 deg F. Rash is improving. Unable to obtain  review of systems from the patient.    Objective:   Vitals:   11/30/15 2220 12/01/15 0140 12/01/15 0701 12/01/15 1037  BP: (!) 138/56 (!) 137/51 (!) 110/45 (!) 106/50  Pulse: (!) 106 (!) 118 96 84  Resp: 20 20 20 18   Temp: (!) 102.4 F (39.1 C) (!) 102.2 F (39 C) 98.6 F (37 C) 98.4 F (36.9 C)  TempSrc:  Axillary Axillary Oral Axillary  SpO2: 94% 94% 97% 97%  Weight:      Height:       No intake or output data in the 24 hours ending 12/01/15 1204   Wt Readings from Last 3 Encounters:  11/28/15 117.9 kg (259 lb 14.8 oz)  10/16/15 124.6 kg (274 lb 12.8 oz)  07/13/15 126.4 kg (278 lb 9.6 oz)     Exam  General: Somnolent, NAD  HEENT:  PERRLA, EOMI, vesicles on the left nose, maxilla upper left lip with crusting  Neck:   Cardiovascular: S1 S2 clear, RRR  Respiratory: CTAB  Gastrointestinal: Soft, nontender, nondistended, + bowel sounds  Ext: no cyanosis clubbing or edema  Neuro: did not cooperate  Skin: as above   Psych: Somnolent   Data Reviewed:  I have personally reviewed following labs and imaging studies  Micro Results Recent Results (from the past 240 hour(s))  Culture, blood (routine x 2)     Status: None (Preliminary result)   Collection Time: 11/27/15 10:00 PM  Result Value Ref Range Status   Specimen Description BLOOD RIGHT ARM  Final   Special Requests BOTTLES DRAWN AEROBIC AND ANAEROBIC 5CC  Final   Culture NO GROWTH 3 DAYS  Final   Report Status PENDING  Incomplete  Culture, blood (routine x 2)     Status: None (Preliminary result)   Collection Time: 11/27/15 10:22 PM  Result Value Ref Range Status   Specimen Description BLOOD RIGHT HAND  Final   Special Requests BOTTLES DRAWN AEROBIC AND ANAEROBIC 5CC  Final   Culture NO GROWTH 3 DAYS  Final   Report Status PENDING  Incomplete    Radiology Reports Ct Maxillofacial Wo Contrast  Result Date: 11/27/2015 CLINICAL DATA:  70 y/o F; left-sided facial swelling with onset Friday is  increasing and ulcer to tender skin on nose. EXAM: CT MAXILLOFACIAL WITHOUT CONTRAST TECHNIQUE: Multidetector CT imaging of the maxillofacial structures was performed. Multiplanar CT image reconstructions were also generated. A small metallic BB was placed on the right temple in order to reliably differentiate right from left. COMPARISON:  None. FINDINGS: Osseous: No fracture or mandibular dislocation. No destructive process. Orbits: No intraorbital traumatic or inflammatory finding. Sinuses: Mild sphenoid and maxillary sinus mucosal thickening. Soft tissues: Centered within the soft tissues of the left nasolabial fold deep to dermal irregularities in the left aspect of the nose there is soft tissue thickening and induration (series 2, image 43). Surrounding this area extending superiorly into periorbital soft tissues, laterally over the malar subcutaneous fat, inferiorly into the left superior lip there is fat stranding and dermal thickening compatible with inflammatory change. No discrete low-attenuation fluid collection is identified. Limited intracranial: No significant or unexpected finding. Calcific atherosclerosis of carotid siphons. IMPRESSION: Inflammatory changes centered in the soft tissues of the left nasal ala and left nasal labial fold extending superiorly to left periorbital soft tissues, laterally over the cheek, and inferiorly into the left upper lip probably representing cellulitis. No discrete low-attenuation fluid collection is identified. No infiltration of the intraorbital space or deep cervical spaces. No osseous erosion. Electronically Signed   By: Kristine Garbe M.D.   On: 11/27/2015 22:58    Lab Data:  CBC:  Recent Labs Lab 11/27/15 2149 11/29/15 0626  WBC 6.7 7.9  NEUTROABS 3.8 4.7  HGB 11.7* 11.1*  HCT 36.8 34.9*  MCV 73.9* 73.8*  PLT 157 161*   Basic Metabolic Panel:  Recent Labs Lab 11/27/15 2149 11/29/15 0626  NA  141 139  K 4.0 3.6  CL 104 104  CO2  26 23  GLUCOSE 195* 149*  BUN 27* 32*  CREATININE 1.83* 2.13*  CALCIUM 8.6* 8.0*   GFR: Estimated Creatinine Clearance: 31 mL/min (by C-G formula based on SCr of 2.13 mg/dL (H)). Liver Function Tests:  Recent Labs Lab 11/27/15 2149  AST 16  ALT 9*  ALKPHOS 78  BILITOT 0.6  PROT 8.2*  ALBUMIN 3.5   No results for input(s): LIPASE, AMYLASE in the last 168 hours. No results for input(s): AMMONIA in the last 168 hours. Coagulation Profile: No results for input(s): INR, PROTIME in the last 168 hours. Cardiac Enzymes: No results for input(s): CKTOTAL, CKMB, CKMBINDEX, TROPONINI in the last 168 hours. BNP (last 3 results) No results for input(s): PROBNP in the last 8760 hours. HbA1C: No results for input(s): HGBA1C in the last 72 hours. CBG:  Recent Labs Lab 11/30/15 0623 11/30/15 1151 11/30/15 1642 11/30/15 2215 12/01/15 0701  GLUCAP 149* 132* 169* 120* 215*   Lipid Profile: No results for input(s): CHOL, HDL, LDLCALC, TRIG, CHOLHDL, LDLDIRECT in the last 72 hours. Thyroid Function Tests: No results for input(s): TSH, T4TOTAL, FREET4, T3FREE, THYROIDAB in the last 72 hours. Anemia Panel: No results for input(s): VITAMINB12, FOLATE, FERRITIN, TIBC, IRON, RETICCTPCT in the last 72 hours. Urine analysis:    Component Value Date/Time   COLORURINE YELLOW 05/20/2015 0148   APPEARANCEUR CLEAR 05/20/2015 0148   LABSPEC 1.016 05/20/2015 0148   PHURINE 5.0 05/20/2015 0148   GLUCOSEU 100 (A) 05/20/2015 0148   GLUCOSEU >=1000 01/02/2009 1101   HGBUR NEGATIVE 05/20/2015 0148   HGBUR negative 03/25/2008 1022   BILIRUBINUR NEGATIVE 05/20/2015 0148   KETONESUR NEGATIVE 05/20/2015 0148   PROTEINUR 100 (A) 05/20/2015 0148   UROBILINOGEN 0.2 09/14/2013 1632   NITRITE NEGATIVE 05/20/2015 0148   LEUKOCYTESUR NEGATIVE 05/20/2015 0148     RAI,RIPUDEEP M.D. [facility] 12/01/2015, 12:04 PM  Pager: [phone] Between 7am to 7pm - call Pager - [phone]  After 7pm go  to [email or web address] - password [email or web address]  Call night coverage person covering after 7pm

## 2015-12-01 NOTE — Progress Notes (Signed)
Pharmacy Antibiotic Note  Joann Gutierrez is a 70 y.o. female admitted on 11/27/2015 with facial rash (suspected herpes zoster).  Pharmacy has been consulted for Acyclovir dosing. Pt also on Ancef for possible superinfection. Patient's Scr has been steadily rising from 1.46 to 3.99, along with decreased urine output. The patient is very lethargic and  Has a temperature max of 102.4 during the past 24 hours. ID following this patient.   Plan: Acyclovir 800mg  (10mg /kg adjusted body wt) q24h Will f/u micro data, renal function, and pt's clinical condition  Height: 5\' 4"  (162.6 cm) Weight: 259 lb 14.8 oz (117.9 kg) IBW/kg (Calculated) : 54.7  Temp (24hrs), Avg:99.7 F (37.6 C), Min:98.4 F (36.9 C), Max:102.4 F (39.1 C)   Recent Labs Lab 11/27/15 2149 11/27/15 2213 11/29/15 0626 12/01/15 1217  WBC 6.7  --  7.9  --   CREATININE 1.83*  --  2.13* 3.99*  LATICACIDVEN  --  1.13  --   --     Estimated Creatinine Clearance: 16.6 mL/min (by C-G formula based on SCr of 3.99 mg/dL (H)).    Allergies  Allergen Reactions  . Morphine And Related Other (See Comments)    Family request not to be given, reports pt does not wake up when given   . Promethazine Hcl Other (See Comments)    REACTION: lethargy    Antimicrobials this admission: 11/7 Acyclovir >>  11/7 Vanc x1 11/7 Ancef >>   Microbiology results: 11/6 BCx x2:   Thank you for allowing pharmacy to be a part of this patient's care.  Georga Bora, PharmD Clinical Pharmacist Pager: [phone] 12/01/2015 2:13 PM

## 2015-12-01 NOTE — Progress Notes (Signed)
Initial Nutrition Assessment  DOCUMENTATION CODES:   Morbid obesity  INTERVENTION:   Ensure Enlive po BID, each supplement provides 350 kcal and 20 grams of protein  NUTRITION DIAGNOSIS:   Inadequate oral intake related to mouth pain, lethargy/confusion (decreased appetite) as evidenced by meal completion < 50%.  GOAL:   Patient will meet greater than or equal to 90% of their needs  MONITOR:   PO intake, Supplement acceptance, I & O's, Skin, Labs  REASON FOR ASSESSMENT:   Malnutrition Screening Tool, Low Braden    ASSESSMENT:   Pt with a past medical history significant for COPD/asthma, gastroparesis, HFpEF, IDDM, HTN, CKD III baseline Cr 1.5, OSA on CPAP and noct O2, and anemia  who presents with face rash for 3 days. Pt being treated for herpes.   Spoke with RN pt has not been eating at meals, has been more lethargic today. Pt sleepy during visit but did answer questions. Reports mouth pain, no N/V, no appetite.  Per chart review pt has had a 39 lb (13% of her body weight) weight loss in the last 6 months, unable to confirm cause of weight or nutrition intake PTA.   Medications reviewed and include: ferrous sulfate, lasix, novolog, lantus, reglan, K-dur Labs reviewed: BUN/Cr 32/2.13 CBG's: 120-215-139 Nutrition-Focused physical exam completed. Findings are no fat depletion, no muscle depletion, and mild edema.     Diet Order:  Diet Carb Modified Fluid consistency: Thin; Room service appropriate? Yes  Skin:  Wound (see comment) (possible herpes infection with blisters on face)  Last BM:  11/6  Height:   Ht Readings from Last 1 Encounters:  11/28/15 5\' 4"  (1.626 m)    Weight:   Wt Readings from Last 1 Encounters:  11/28/15 259 lb 14.8 oz (117.9 kg)    Ideal Body Weight:  54.5 kg  BMI:  Body mass index is 44.62 kg/m.  Estimated Nutritional Needs:   Kcal:  1600-1800  Protein:  100-115 grams  Fluid:  > 1.6 L/day  EDUCATION NEEDS:   No education  needs identified at this time  [name], [place], [place] Pager [phone] After Hours Pager

## 2015-12-01 NOTE — Progress Notes (Signed)
Pt sitting up in a chair eating her lunch. No noted distress. Safety measures in place. Call bell within reach. Will continue to monitor.

## 2015-12-01 NOTE — Progress Notes (Signed)
[facility]  Date of Admission:  11/27/2015           Day 5 acyclovir        Day 4 cefazolin  Principal Problem:   Herpes zoster with complication Active Problems:   Obstructive sleep apnea   Chronic obstructive asthma (HCC)   Gastroparesis   CKD (chronic kidney disease), stage III   Essential hypertension   Insulin dependent diabetes mellitus (HCC)   Severe obesity (BMI >= 40) (HCC)   Anemia of chronic disease   Cellulitis of face   . acyclovir  10 mg/kg (Adjusted) Intravenous Q12H  . aspirin EC  81 mg Oral Daily  . atorvastatin  10 mg Oral Daily  . bacitracin   Topical BID  .  ceFAZolin (ANCEF) IV  500 mg Intravenous Q12H  . cyclobenzaprine  5 mg Oral QHS  . donepezil  10 mg Oral QHS  . doxazosin  2 mg Oral QHS  . enoxaparin (LOVENOX) injection  40 mg Subcutaneous Q24H  . ezetimibe  10 mg Oral QHS  . ferrous sulfate  325 mg Oral Q breakfast  . furosemide  80 mg Oral BID  . gabapentin  100 mg Oral BID  . hydrALAZINE  100 mg Oral TID  . insulin aspart  0-15 Units Subcutaneous TID WC  . insulin aspart  0-5 Units Subcutaneous QHS  . insulin glargine  15 Units Subcutaneous QHS  . isosorbide mononitrate  120 mg Oral Daily  . losartan  100 mg Oral Daily  . mouth rinse  15 mL Mouth Rinse BID  . metoCLOPramide (REGLAN) injection  10 mg Intravenous Q8H  . metolazone  2.5 mg Oral Once per day on Mon Wed Fri  . mometasone-formoterol  2 puff Inhalation BID  . montelukast  10 mg Oral Daily  . potassium chloride SA  20 mEq Oral Daily  . raloxifene  60 mg Oral Daily    Review of Systems: Review of Systems  Unable to perform ROS: Mental status change    Past Medical History:  Diagnosis Date  . Adenomatous colon polyp   . Allergy   . Anemia   . Asthma       . CAD (coronary artery disease)    Mild very minimal coronary disease with 20% obtuse marginal stenosis  . Carpal tunnel syndrome on left   . CHF (congestive heart failure)  ([place])   . Chronic kidney disease (CKD), stage III (moderate)   . COPD (chronic obstructive pulmonary disease) ([place])   . CVA (cerebral infarction)   . Depression   . 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 Diverticulosis   . 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 Esophageal dysmotility   . Esophageal stricture   . Gastritis   . Gastroparesis 08/21/2007  . GERD (gastroesophageal reflux disease)   . Hair loss   . Hearing loss of both ears   . Hernia, hiatal   . Hyperlipidemia   . Hypertension   . Morbid obesity ([place])   . Osteoarthritis   . OSTEOARTHRITIS 08/09/2006  . Osteoporosis   . 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 RENAL INSUFFICIENCY 02/16/2009  . 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 Seizures ([place])    pt thinks it has been several monthes since she had a seisure  . Sickle cell trait ([place])   . Stroke ([place])   . Tubular adenoma of colon  Social History  Substance Use Topics  . Smoking status: Former Smoker    Packs/day: 0.50    Years: 10.00    Quit date: 04/18/1980  . Smokeless tobacco: Never Used  . Alcohol use No    Family History  Problem Relation Age of Onset  . Liver cancer Mother     Liver Cancer  . Diabetes Mother   . Kidney disease Mother   . Heart disease Mother     age 47's  . Heart disease Father 74    MI  . Heart attack Father     died of MI when pt was 42  . Sickle cell anemia Father   . Colon cancer Brother   . Cancer Brother     Colon Cancer  . Diabetes Sister   . Kidney disease Sister   . Heart disease Sister     age 34's  . Allergies Sister   . Diabetes Sister   . Kidney disease Sister   . Heart disease Sister     age 52's  . Esophageal cancer Neg Hx   . Rectal cancer Neg Hx   . Stomach cancer Neg Hx    Allergies  Allergen Reactions  . Morphine And Related Other (See Comments)    Family request not to be given, reports pt does not wake up when given   . Promethazine Hcl Other (See Comments)    REACTION:  lethargy    OBJECTIVE: Vitals:   11/30/15 2220 12/01/15 0140 12/01/15 0701 12/01/15 1037  BP: (!) 138/56 (!) 137/51 (!) 110/45 (!) 106/50  Pulse: (!) 106 (!) 118 96 84  Resp: 20 20 20 18   Temp: (!) 102.4 F (39.1 C) (!) 102.2 F (39 C) 98.6 F (37 C) 98.4 F (36.9 C)  TempSrc: Axillary Axillary Oral Axillary  SpO2: 94% 94% 97% 97%  Weight:      Height:       Body mass index is 44.62 kg/m.  Physical Exam  Constitutional:  She is very somnolent. She will try to open her eyes when I speak with her but she does not respond.  Neck: Neck supple.  Cardiovascular: Normal rate and regular rhythm.   No murmur heard. Pulmonary/Chest: Effort normal and breath sounds normal. She has no wheezes. She has no rales.  Abdominal: Soft. She exhibits no distension.  Skin:  There are cell lesions on left cheek appear to be resolving. No purulent drainage or obvious surrounding cellulitis now.    Lab Results Lab Results  Component Value Date   WBC 7.9 11/29/2015   HGB 11.1 (L) 11/29/2015   HCT 34.9 (L) 11/29/2015   MCV 73.8 (L) 11/29/2015   PLT 130 (L) 11/29/2015    Lab Results  Component Value Date   CREATININE 2.13 (H) 11/29/2015   BUN 32 (H) 11/29/2015   NA 139 11/29/2015   K 3.6 11/29/2015   CL 104 11/29/2015   CO2 23 11/29/2015    Lab Results  Component Value Date   ALT 9 (L) 11/27/2015   AST 16 11/27/2015   ALKPHOS 78 11/27/2015   BILITOT 0.6 11/27/2015     Microbiology: Recent Results (from the past 240 hour(s))  Culture, blood (routine x 2)     Status: None (Preliminary result)   Collection Time: 11/27/15 10:00 PM  Result Value Ref Range Status   Specimen Description BLOOD RIGHT ARM  Final   Special Requests BOTTLES DRAWN AEROBIC AND ANAEROBIC 5CC  Final   Culture NO  GROWTH 3 DAYS  Final   Report Status PENDING  Incomplete  Culture, blood (routine x 2)     Status: None (Preliminary result)   Collection Time: 11/27/15 10:22 PM  Result Value Ref Range Status    Specimen Description BLOOD RIGHT HAND  Final   Special Requests BOTTLES DRAWN AEROBIC AND ANAEROBIC 5CC  Final   Culture NO GROWTH 3 DAYS  Final   Report Status PENDING  Incomplete     ASSESSMENT: Her shingles seems to be slowly resolving. Entirely sure why she is so lethargic and still febrile. Her nurse tells me that she is not on any sedating medications. The nurse also tells me that she waxes and wanes between being alert and talkative and being very sleepy. This makes Maricela encephalitis extremely unlikely.  PLAN: 1. Continue acyclovir and cefazolin for now 2. I will follow-up tomorrow  Michel Bickers, MD [place] [facility] [place] [phone] pager   [phone] cell 12/01/2015, 11:22 AM

## 2015-12-01 NOTE — Progress Notes (Signed)
Pt ate 50% of her lunch and 1 container of Ensure.

## 2015-12-01 NOTE — Progress Notes (Signed)
Pt alert, verbal with no noted distress. She ate 75% of her dinner.

## 2015-12-02 DIAGNOSIS — B0222 Postherpetic trigeminal neuralgia: Secondary | ICD-10-CM

## 2015-12-02 LAB — BASIC METABOLIC PANEL
ANION GAP: 10 (ref 5–15)
BUN: 68 mg/dL — ABNORMAL HIGH (ref 6–20)
CALCIUM: 8 mg/dL — AB (ref 8.9–10.3)
CHLORIDE: 105 mmol/L (ref 101–111)
CO2: 22 mmol/L (ref 22–32)
Creatinine, Ser: 3.31 mg/dL — ABNORMAL HIGH (ref 0.44–1.00)
GFR calc non Af Amer: 13 mL/min — ABNORMAL LOW (ref >60)
GFR, EST AFRICAN AMERICAN: 15 mL/min — AB (ref >60)
GLUCOSE: 136 mg/dL — AB (ref 65–99)
Potassium: 4.1 mmol/L (ref 3.5–5.1)
Sodium: 137 mmol/L (ref 135–145)

## 2015-12-02 LAB — URINALYSIS, ROUTINE W REFLEX MICROSCOPIC
Bilirubin Urine: NEGATIVE
Glucose, UA: NEGATIVE mg/dL
Hgb urine dipstick: NEGATIVE
KETONES UR: NEGATIVE mg/dL
LEUKOCYTES UA: NEGATIVE
NITRITE: NEGATIVE
PROTEIN: NEGATIVE mg/dL
Specific Gravity, Urine: 1.019 (ref 1.005–1.030)
pH: 5 (ref 5.0–8.0)

## 2015-12-02 LAB — CULTURE, BLOOD (ROUTINE X 2)
Culture: NO GROWTH
Culture: NO GROWTH

## 2015-12-02 LAB — GLUCOSE, CAPILLARY
GLUCOSE-CAPILLARY: 164 mg/dL — AB (ref 65–99)
GLUCOSE-CAPILLARY: 137 mg/dL — AB (ref 65–99)
Glucose-Capillary: 130 mg/dL — ABNORMAL HIGH (ref 65–99)
Glucose-Capillary: 160 mg/dL — ABNORMAL HIGH (ref 65–99)

## 2015-12-02 MED ORDER — OFLOXACIN 0.3 % OP SOLN
5.0000 [drp] | Freq: Every day | OPHTHALMIC | Status: DC
  Administered 2015-12-02: 5 [drp] via OTIC
  Filled 2015-12-02: qty 5

## 2015-12-02 MED ORDER — OFLOXACIN 0.3 % OP SOLN
2.0000 [drp] | Freq: Every day | OPHTHALMIC | Status: DC
  Filled 2015-12-02: qty 5

## 2015-12-02 MED ORDER — TRAMADOL HCL 50 MG PO TABS
50.0000 mg | ORAL_TABLET | Freq: Four times a day (QID) | ORAL | Status: DC | PRN
  Administered 2015-12-02 – 2015-12-03 (×3): 50 mg via ORAL
  Filled 2015-12-02 (×3): qty 1

## 2015-12-02 MED ORDER — VALACYCLOVIR HCL 500 MG PO TABS
1000.0000 mg | ORAL_TABLET | Freq: Every day | ORAL | Status: DC
Start: 2015-12-02 — End: 2015-12-04
  Administered 2015-12-03 – 2015-12-04 (×2): 1000 mg via ORAL
  Filled 2015-12-02 (×2): qty 2

## 2015-12-02 MED ORDER — GABAPENTIN 100 MG PO CAPS
100.0000 mg | ORAL_CAPSULE | Freq: Two times a day (BID) | ORAL | Status: DC
  Administered 2015-12-02 (×2): 100 mg via ORAL
  Filled 2015-12-02 (×2): qty 1

## 2015-12-02 MED ORDER — TRAMADOL HCL 50 MG PO TABS
50.0000 mg | ORAL_TABLET | Freq: Two times a day (BID) | ORAL | Status: DC | PRN
  Administered 2015-12-02: 50 mg via ORAL
  Filled 2015-12-02 (×2): qty 1

## 2015-12-02 NOTE — Progress Notes (Addendum)
MD was notified patient complaining of bilateral lower extremity 10/10 pain and +1 pitting edema, patient reports she has not been taking her lasix, also complains of left ear pain. Patient withdraws when lower legs and left and right ear are touched. See new orders.

## 2015-12-02 NOTE — Progress Notes (Signed)
RN ordered new tray of lunch for patient per family request. Patient ate 1/4 of chicken salad sandwich, ate 1 cup of applesauce w RN.  Patient ate <10% of dinner, stated she did not like the food, RN offered to order different tray, patient refused, stated she wanted to sleep.

## 2015-12-02 NOTE — Progress Notes (Signed)
[facility]  Date of Admission:  11/27/2015           Day 6 acyclovir         Day 5 cefazolin  Principal Problem:   Herpes zoster with complication Active Problems:   Obstructive sleep apnea   Chronic obstructive asthma (HCC)   Gastroparesis   CKD (chronic kidney disease), stage III   Essential hypertension   Insulin dependent diabetes mellitus (HCC)   Severe obesity (BMI >= 40) (HCC)   Anemia of chronic disease   Cellulitis of face   . aspirin EC  81 mg Oral Daily  . atorvastatin  10 mg Oral Daily  . bacitracin   Topical BID  . donepezil  10 mg Oral QHS  . doxazosin  2 mg Oral QHS  . ezetimibe  10 mg Oral QHS  . feeding supplement (ENSURE ENLIVE)  237 mL Oral BID BM  . ferrous sulfate  325 mg Oral Q breakfast  . gabapentin  100 mg Oral BID  . hydrALAZINE  100 mg Oral TID  . insulin aspart  0-15 Units Subcutaneous TID WC  . insulin aspart  0-5 Units Subcutaneous QHS  . insulin glargine  15 Units Subcutaneous QHS  . isosorbide mononitrate  120 mg Oral Daily  . mouth rinse  15 mL Mouth Rinse BID  . mometasone-formoterol  2 puff Inhalation BID  . montelukast  10 mg Oral Daily  . [START ON 12/03/2015] ofloxacin  2 drop Left Ear Daily  . raloxifene  60 mg Oral Daily  . valACYclovir  1,000 mg Oral Daily    SUBJECTIVE: She is complaining of headache with left facial pain extending from her chin to her left ear.   Review of Systems: Review of Systems  Constitutional: Negative for chills, diaphoresis, fever, malaise/fatigue and weight loss.  HENT: Positive for ear pain. Negative for hearing loss and sore throat.   Respiratory: Negative for cough, sputum production and shortness of breath.   Cardiovascular: Negative for chest pain.  Gastrointestinal: Negative for abdominal pain, diarrhea, nausea and vomiting.       Still not eating well.  Genitourinary: Negative for dysuria and frequency.  Musculoskeletal: Negative for joint pain and  myalgias.  Skin: Negative for rash.  Neurological: Positive for headaches. Negative for dizziness.    Past Medical History:  Diagnosis Date  . Adenomatous colon polyp   . Allergy   . Anemia   . Asthma       . CAD (coronary artery disease)    Mild very minimal coronary disease with 20% obtuse marginal stenosis  . Carpal tunnel syndrome on left   . CHF (congestive heart failure) ([employer])   . Chronic kidney disease (CKD), stage III (moderate)   . COPD (chronic obstructive pulmonary disease) ([place])   . CVA (cerebral infarction)   . Depression   . 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 Diverticulosis   . 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 Esophageal dysmotility   . Esophageal stricture   . Gastritis   . Gastroparesis 08/21/2007  . GERD (gastroesophageal reflux disease)   . Hair loss   . Hearing loss of both ears   . Hernia, hiatal   . Hyperlipidemia   . Hypertension   . Morbid obesity ([place])   . Osteoarthritis   . OSTEOARTHRITIS 08/09/2006  . Osteoporosis   . 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  RENAL INSUFFICIENCY 02/16/2009  . 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 Seizures ([place])    pt thinks it has been several monthes since she had a seisure  . Sickle cell trait ([place])   . Stroke ([place])   . Tubular adenoma of colon     Social History  Substance Use Topics  . Smoking status: Former Smoker    Packs/day: 0.50    Years: 10.00    Quit date: 04/18/1980  . Smokeless tobacco: Never Used  . Alcohol use No    Family History  Problem Relation Age of Onset  . Liver cancer Mother     Liver Cancer  . Diabetes Mother   . Kidney disease Mother   . Heart disease Mother     age 11's  . Heart disease Father 23    MI  . Heart attack Father     died of MI when pt was 25  . Sickle cell anemia Father   . Colon cancer Brother   . Cancer Brother     Colon Cancer  . Diabetes Sister   . Kidney disease Sister   . Heart disease Sister     age 74's  .  Allergies Sister   . Diabetes Sister   . Kidney disease Sister   . Heart disease Sister     age 39's  . Esophageal cancer Neg Hx   . Rectal cancer Neg Hx   . Stomach cancer Neg Hx    Allergies  Allergen Reactions  . Morphine And Related Other (See Comments)    Family request not to be given, reports pt does not wake up when given   . Promethazine Hcl Other (See Comments)    REACTION: lethargy    OBJECTIVE: Vitals:   12/01/15 2100 12/02/15 0100 12/02/15 0500 12/02/15 0921  BP: (!) 110/58 (!) 121/55 113/62 113/68  Pulse: 86 86 76 77  Resp: 17 18 17 19   Temp: 97.5 F (36.4 C) 97.6 F (36.4 C) 97.8 F (36.6 C) 97.7 F (36.5 C)  TempSrc: Axillary Axillary Axillary Oral  SpO2: 97% 98% 97% 98%  Weight:      Height:       Body mass index is 44.62 kg/m.  Physical Exam  Constitutional: She is oriented to person, place, and time.  She looks much better today. She is alert and talkative sitting up in a chair.  HENT:  Mouth/Throat: No oropharyngeal exudate.  Eyes: Conjunctivae are normal.  Neck: Neck supple.  Cardiovascular: Normal rate and regular rhythm.   No murmur heard. Pulmonary/Chest: Effort normal and breath sounds normal.  Abdominal: Soft. There is no tenderness.  Neurological: She is alert and oriented to person, place, and time.  Skin:  The superficial ulcers on her left cheek and chin are improving. There is no evidence of bacterial superinfection at this time.  Psychiatric: Mood and affect normal.    Lab Results Lab Results  Component Value Date   WBC 7.9 11/29/2015   HGB 11.1 (L) 11/29/2015   HCT 34.9 (L) 11/29/2015   MCV 73.8 (L) 11/29/2015   PLT 130 (L) 11/29/2015    Lab Results  Component Value Date   CREATININE 3.31 (H) 12/02/2015   BUN 68 (H) 12/02/2015   NA 137 12/02/2015   K 4.1 12/02/2015   CL 105 12/02/2015   CO2 22 12/02/2015    Lab Results  Component Value Date   ALT 9 (L) 11/27/2015   AST 16 11/27/2015   ALKPHOS 78  11/27/2015    BILITOT 0.6 11/27/2015     Microbiology: Recent Results (from the past 240 hour(s))  Culture, blood (routine x 2)     Status: None   Collection Time: 11/27/15 10:00 PM  Result Value Ref Range Status   Specimen Description BLOOD RIGHT ARM  Final   Special Requests BOTTLES DRAWN AEROBIC AND ANAEROBIC 5CC  Final   Culture NO GROWTH 5 DAYS  Final   Report Status 12/02/2015 FINAL  Final  Culture, blood (routine x 2)     Status: None   Collection Time: 11/27/15 10:22 PM  Result Value Ref Range Status   Specimen Description BLOOD RIGHT HAND  Final   Special Requests BOTTLES DRAWN AEROBIC AND ANAEROBIC 5CC  Final   Culture NO GROWTH 5 DAYS  Final   Report Status 12/02/2015 FINAL  Final     ASSESSMENT: She has improving trigeminal zoster. I will change IV acyclovir to renally adjusted valacyclovir and plan on 4 more days of therapy. I will stop cefazolin now.   PLAN: Continue valacyclovir for 4 more days  I will sign off now  Michel Bickers, MD [facility] [place] [phone] pager   [phone] cell 12/02/2015, 1:56 PM

## 2015-12-02 NOTE — Progress Notes (Signed)
[facility]                                                                              Patient Demographics  Joann Gutierrez, is a 70 y.o. female, DOB - [birth year], YOV:[id number]  Admit date - 11/27/2015   Admitting Physician Edwin Dada, MD  Outpatient Primary MD for the patient is Kandice Hams, MD  Outpatient specialists:   LOS - 4  days    Chief Complaint  Patient presents with  . Angioedema       Brief summary   Joann Gutierrez is a 70 y.o. female with a past medical history significant for COPD/asthma, gastroparesis, HFpEF, IDDM, HTN, CKD III baseline Cr 1.5, OSA on CPAP and noct O2, and anemia  who presents with face rash for 3 days. The patient was in her usual health until 72 hours ago when she developed aching pain in her left ear and eye as well as gums "aching".  Then Saturday she developed a rash on her left face, starting with a "bump" on her left nasolabial fold that her daughter thought was from her CPAP machine.  This progressed to numerous fluid-filled vesicles on her left cheek, nose, left upper lip, and roof of mouth.  Today, this kept progressing so they came to the ER. -CT maxillofacial protocol showed soft tissue swelling in the left face without abscess or periororbital cellulitis -She was given vancomycin for cellulitis And patient was admitted for further workup.   Assessment & Plan   Herpes zoster of V2 distribution with periorbital swelling, superimposed infection with mild blepharoconjunctivitis -  Possibly with some bacterial superinfection/impetigo.The rash appears to be improving   - Appreciate ophthalmology recommendations, recommended bacitracin ointment twice a day to affected areas - Appreciate ID recommendations, will continue acyclovir and cefazolin  Acute encephalopathy: Likely due to uremia and nephrotoxicity from acyclovir and multiple medications - Much improved today, patient alert and oriented 3,  confirmed with husband at the bedside - All sedating medications have been tapered down and discontinued  -Appreciate neurology recommendations    OSA:  -Continue CPAP at night with bleed in O2   Acute on chronic CKD:  - Creatinine improving today - Discontinued Lasix, metolazone, Reglan, and losartan, placed on IV fluids  Gastroparesis:  - Hold Reglan for now   Anemia:  Anemia of chronic disease and renal disease.  Stable.  At baseline. -Continue iron   HTN:  BP currently stable, continue Imdur, hydralazine, Cardura -Continue statin, aspirin, Zetia  COPD/Asthma:  -Continue Symbicort as formulary alternative -Continue montelukast  IDDM: -Continue Lantus -SSI with meals   Chronic diastolic CHF: Appears euvolemic.  EF 60-65% last May. - currently stable, continue to hold furosemide, potassium, metolazone  - Watch volume status closely.  -If creatinine function continues to improve, will KVO fluids in a.m. but continue to hold diuretics   Code Status: Full CODE STATUS DVT Prophylaxis:  Lovenox  Family Communication: Discussed in detail with the patient, all imaging results, lab results explained to the patient and husband this am   Disposition Plan:   Time Spent in minutes   25 minutes  Procedures:    Consultants:    Ophthalmology  Antimicrobials:      Medications  Scheduled Meds: . acyclovir  10 mg/kg (Adjusted) Intravenous Q24H  . aspirin EC  81 mg Oral Daily  . atorvastatin  10 mg Oral Daily  . bacitracin   Topical BID  .  ceFAZolin (ANCEF) IV  500 mg Intravenous Q12H  . donepezil  10 mg Oral QHS  . doxazosin  2 mg Oral QHS  . ezetimibe  10 mg Oral QHS  . feeding supplement (ENSURE ENLIVE)  237 mL Oral BID BM  . ferrous sulfate  325 mg Oral Q breakfast  . hydrALAZINE  100 mg Oral TID  . insulin aspart  0-15 Units Subcutaneous TID WC  . insulin aspart  0-5 Units Subcutaneous QHS  . insulin glargine  15 Units Subcutaneous QHS  .  isosorbide mononitrate  120 mg Oral Daily  . mouth rinse  15 mL Mouth Rinse BID  . mometasone-formoterol  2 puff Inhalation BID  . montelukast  10 mg Oral Daily  . raloxifene  60 mg Oral Daily   Continuous Infusions: . sodium chloride 100 mL/hr at 12/02/15 0448   PRN Meds:.acetaminophen **OR** acetaminophen, hydrALAZINE, ondansetron **OR** ondansetron (ZOFRAN) IV   Antibiotics   Anti-infectives    Start     Dose/Rate Route Frequency Ordered Stop   12/02/15 1245  acyclovir (ZOVIRAX) 800 mg in dextrose 5 % 150 mL IVPB     10 mg/kg  80 kg (Adjusted) 166 mL/hr over 60 Minutes Intravenous Every 24 hours 12/01/15 1406     11/28/15 1200  acyclovir (ZOVIRAX) 800 mg in dextrose 5 % 150 mL IVPB  Status:  Discontinued     10 mg/kg  80 kg (Adjusted) 166 mL/hr over 60 Minutes Intravenous Every 12 hours 11/28/15 0050 12/01/15 1406   11/28/15 1000  ceFAZolin (ANCEF) 500 mg in dextrose 5 % 100 mL IVPB     500 mg 210 mL/hr over 30 Minutes Intravenous Every 12 hours 11/28/15 0156     11/28/15 0100  acyclovir (ZOVIRAX) 800 mg in dextrose 5 % 150 mL IVPB     10 mg/kg  80 kg (Adjusted) 166 mL/hr over 60 Minutes Intravenous NOW 11/28/15 0050 11/28/15 0223   11/28/15 0045  valACYclovir (VALTREX) tablet 1,000 mg  Status:  Discontinued     1,000 mg Oral Daily 11/28/15 0036 11/28/15 0038   11/27/15 2200  vancomycin (VANCOCIN) IVPB 1000 mg/200 mL premix     1,000 mg 200 mL/hr over 60 Minutes Intravenous  Once 11/27/15 2150 11/28/15 0044        Subjective:   Joann Gutierrez was seen and examined today. Much more alert and oriented today, close to baseline mental status per husband at the bedside. No fevers today. Rash is improving. No nausea, vomiting. Slight abdominal discomfort today. No chest pain, shortness of breath, no new weakness, and diarrhea   Objective:   Vitals:   12/01/15 2100 12/02/15 0100 12/02/15 0500 12/02/15 0921  BP: (!) 110/58 (!) 121/55 113/62 113/68  Pulse: 86 86 76 77    Resp: 17 18 17 19   Temp: 97.5 F (36.4 C) 97.6 F (36.4 C) 97.8 F (36.6 C) 97.7 F (36.5 C)  TempSrc: Axillary Axillary Axillary Oral  SpO2: 97% 98% 97% 98%  Weight:      Height:        Intake/Output Summary (Last 24 hours) at 12/02/15 1131 Last data filed at 12/02/15 0340  Gross per 24  hour  Intake             1820 ml  Output                0 ml  Net             1820 ml     Wt Readings from Last 3 Encounters:  11/28/15 117.9 kg (259 lb 14.8 oz)  10/16/15 124.6 kg (274 lb 12.8 oz)  07/13/15 126.4 kg (278 lb 9.6 oz)     Exam  General: Alert and oriented 3  NAD  HEENT:  PERRLA, EOMI, Rash improving   Neck:   Cardiovascular: S1 S2 clear, RRR  Respiratory: CTAB  Gastrointestinal: Soft, mild diffuse tenderness , nondistended, + bowel sounds  Ext: no cyanosis clubbing or edema  Neuro: strength 5/5 upper and lower extremities bilaterally   Skin: as above   Psych: alert and oriented 3   Data Reviewed:  I have personally reviewed following labs and imaging studies  Micro Results Recent Results (from the past 240 hour(s))  Culture, blood (routine x 2)     Status: None   Collection Time: 11/27/15 10:00 PM  Result Value Ref Range Status   Specimen Description BLOOD RIGHT ARM  Final   Special Requests BOTTLES DRAWN AEROBIC AND ANAEROBIC 5CC  Final   Culture NO GROWTH 5 DAYS  Final   Report Status 12/02/2015 FINAL  Final  Culture, blood (routine x 2)     Status: None   Collection Time: 11/27/15 10:22 PM  Result Value Ref Range Status   Specimen Description BLOOD RIGHT HAND  Final   Special Requests BOTTLES DRAWN AEROBIC AND ANAEROBIC 5CC  Final   Culture NO GROWTH 5 DAYS  Final   Report Status 12/02/2015 FINAL  Final    Radiology Reports Ct Maxillofacial Wo Contrast  Result Date: 11/27/2015 CLINICAL DATA:  70 y/o F; left-sided facial swelling with onset Friday is increasing and ulcer to tender skin on nose. EXAM: CT MAXILLOFACIAL WITHOUT CONTRAST  TECHNIQUE: Multidetector CT imaging of the maxillofacial structures was performed. Multiplanar CT image reconstructions were also generated. A small metallic BB was placed on the right temple in order to reliably differentiate right from left. COMPARISON:  None. FINDINGS: Osseous: No fracture or mandibular dislocation. No destructive process. Orbits: No intraorbital traumatic or inflammatory finding. Sinuses: Mild sphenoid and maxillary sinus mucosal thickening. Soft tissues: Centered within the soft tissues of the left nasolabial fold deep to dermal irregularities in the left aspect of the nose there is soft tissue thickening and induration (series 2, image 43). Surrounding this area extending superiorly into periorbital soft tissues, laterally over the malar subcutaneous fat, inferiorly into the left superior lip there is fat stranding and dermal thickening compatible with inflammatory change. No discrete low-attenuation fluid collection is identified. Limited intracranial: No significant or unexpected finding. Calcific atherosclerosis of carotid siphons. IMPRESSION: Inflammatory changes centered in the soft tissues of the left nasal ala and left nasal labial fold extending superiorly to left periorbital soft tissues, laterally over the cheek, and inferiorly into the left upper lip probably representing cellulitis. No discrete low-attenuation fluid collection is identified. No infiltration of the intraorbital space or deep cervical spaces. No osseous erosion. Electronically Signed   By: Kristine Garbe M.D.   On: 11/27/2015 22:58    Lab Data:  CBC:  Recent Labs Lab 11/27/15 2149 11/29/15 0626  WBC 6.7 7.9  NEUTROABS 3.8 4.7  HGB 11.7* 11.1*  HCT 36.8 34.9*  MCV 73.9* 73.8*  PLT 157 237*   Basic Metabolic Panel:  Recent Labs Lab 11/27/15 2149 11/29/15 0626 12/01/15 1217 12/02/15 0417  NA 141 139 136 137  K 4.0 3.6 4.1 4.1  CL 104 104 104 105  CO2 26 23 22 22   GLUCOSE 195* 149*  129* 136*  BUN 27* 32* 63* 68*  CREATININE 1.83* 2.13* 3.99* 3.31*  CALCIUM 8.6* 8.0* 7.9* 8.0*   GFR: Estimated Creatinine Clearance: 20 mL/min (by C-G formula based on SCr of 3.31 mg/dL (H)). Liver Function Tests:  Recent Labs Lab 11/27/15 2149  AST 16  ALT 9*  ALKPHOS 78  BILITOT 0.6  PROT 8.2*  ALBUMIN 3.5   No results for input(s): LIPASE, AMYLASE in the last 168 hours. No results for input(s): AMMONIA in the last 168 hours. Coagulation Profile: No results for input(s): INR, PROTIME in the last 168 hours. Cardiac Enzymes: No results for input(s): CKTOTAL, CKMB, CKMBINDEX, TROPONINI in the last 168 hours. BNP (last 3 results) No results for input(s): PROBNP in the last 8760 hours. HbA1C: No results for input(s): HGBA1C in the last 72 hours. CBG:  Recent Labs Lab 12/01/15 1209 12/01/15 1706 12/01/15 2202 12/02/15 0631 12/02/15 1121  GLUCAP 139* 204* 124* 130* 160*   Lipid Profile: No results for input(s): CHOL, HDL, LDLCALC, TRIG, CHOLHDL, LDLDIRECT in the last 72 hours. Thyroid Function Tests: No results for input(s): TSH, T4TOTAL, FREET4, T3FREE, THYROIDAB in the last 72 hours. Anemia Panel: No results for input(s): VITAMINB12, FOLATE, FERRITIN, TIBC, IRON, RETICCTPCT in the last 72 hours. Urine analysis:    Component Value Date/Time   COLORURINE YELLOW 12/02/2015 [place] 12/02/2015 0651   LABSPEC 1.019 12/02/2015 0651   PHURINE 5.0 12/02/2015 0651   GLUCOSEU NEGATIVE 12/02/2015 0651   GLUCOSEU >=1000 01/02/2009 1101   HGBUR NEGATIVE 12/02/2015 0651   HGBUR negative 03/25/2008 [place] 12/02/2015 0651   KETONESUR NEGATIVE 12/02/2015 0651   PROTEINUR NEGATIVE 12/02/2015 0651   UROBILINOGEN 0.2 09/14/2013 1632   NITRITE NEGATIVE 12/02/2015 0651   LEUKOCYTESUR NEGATIVE 12/02/2015 6283     Kaysen Sefcik M.D. [facility] 12/02/2015, 11:31 AM  Pager: [phone] Between 7am to 7pm - call Pager -  [phone]  After 7pm go to [email or web address] - password [email or web address]  Call night coverage person covering after 7pm

## 2015-12-02 NOTE — Progress Notes (Signed)
Patient rating pain 10/10, tramadol prn q12h although not time for another dose yet. On call paged [phone]. Return page [phone], order for tramadol prn q6h. Continue to monitor patient.

## 2015-12-03 ENCOUNTER — Inpatient Hospital Stay (HOSPITAL_COMMUNITY): Payer: Medicare Other

## 2015-12-03 DIAGNOSIS — M79609 Pain in unspecified limb: Secondary | ICD-10-CM

## 2015-12-03 LAB — BASIC METABOLIC PANEL
Anion gap: 9 (ref 5–15)
BUN: 65 mg/dL — AB (ref 6–20)
CALCIUM: 8.2 mg/dL — AB (ref 8.9–10.3)
CO2: 24 mmol/L (ref 22–32)
CREATININE: 1.97 mg/dL — AB (ref 0.44–1.00)
Chloride: 106 mmol/L (ref 101–111)
GFR calc Af Amer: 28 mL/min — ABNORMAL LOW (ref >60)
GFR, EST NON AFRICAN AMERICAN: 25 mL/min — AB (ref >60)
GLUCOSE: 137 mg/dL — AB (ref 65–99)
Potassium: 4 mmol/L (ref 3.5–5.1)
Sodium: 139 mmol/L (ref 135–145)

## 2015-12-03 LAB — GLUCOSE, CAPILLARY
GLUCOSE-CAPILLARY: 157 mg/dL — AB (ref 65–99)
Glucose-Capillary: 186 mg/dL — ABNORMAL HIGH (ref 65–99)
Glucose-Capillary: 180 mg/dL — ABNORMAL HIGH (ref 65–99)

## 2015-12-03 MED ORDER — BACITRACIN ZINC 500 UNIT/GM EX OINT: TOPICAL_OINTMENT | Freq: Two times a day (BID) | CUTANEOUS | 2 refills | Status: DC

## 2015-12-03 MED ORDER — FUROSEMIDE 40 MG PO TABS: 40.0000 mg | ORAL_TABLET | Freq: Every day | ORAL | Status: DC

## 2015-12-03 MED ORDER — TRAMADOL HCL 50 MG PO TABS: 50.0000 mg | ORAL_TABLET | Freq: Three times a day (TID) | ORAL | 0 refills | Status: DC | PRN

## 2015-12-03 MED ORDER — OFLOXACIN 0.3 % OP SOLN
2.0000 [drp] | Freq: Four times a day (QID) | OPHTHALMIC | Status: DC
  Administered 2015-12-03 – 2015-12-04 (×5): 2 [drp] via OTIC
  Filled 2015-12-03: qty 5

## 2015-12-03 MED ORDER — FUROSEMIDE 80 MG PO TABS
80.0000 mg | ORAL_TABLET | Freq: Every day | ORAL | Status: DC
  Administered 2015-12-03: 80 mg via ORAL
  Filled 2015-12-03: qty 1

## 2015-12-03 MED ORDER — OFLOXACIN 0.3 % OP SOLN: 2.0000 [drp] | Freq: Four times a day (QID) | OPHTHALMIC | 0 refills | Status: AC

## 2015-12-03 MED ORDER — FUROSEMIDE 80 MG PO TABS: 80.0000 mg | ORAL_TABLET | Freq: Two times a day (BID) | ORAL | Status: DC

## 2015-12-03 MED ORDER — VALACYCLOVIR HCL 1 G PO TABS: 1000.0000 mg | ORAL_TABLET | Freq: Every day | ORAL | 0 refills | Status: DC

## 2015-12-03 MED ORDER — GABAPENTIN 300 MG PO CAPS
300.0000 mg | ORAL_CAPSULE | Freq: Two times a day (BID) | ORAL | Status: DC
  Administered 2015-12-03 – 2015-12-04 (×3): 300 mg via ORAL
  Filled 2015-12-03 (×3): qty 1

## 2015-12-03 MED ORDER — POLYVINYL ALCOHOL 1.4 % OP SOLN
1.0000 [drp] | OPHTHALMIC | Status: DC | PRN
  Administered 2015-12-03: 1 [drp] via OPHTHALMIC
  Filled 2015-12-03: qty 15

## 2015-12-03 MED ORDER — ARTIFICIAL TEARS OP OINT
TOPICAL_OINTMENT | OPHTHALMIC | Status: DC | PRN
  Filled 2015-12-03: qty 3.5

## 2015-12-03 NOTE — Evaluation (Signed)
Occupational Therapy Evaluation Patient Details Name: Joann Gutierrez MRN: [id number] DOB: [birth year] Today's Date: 12/03/2015    History of Present Illness Joann Gutierrez a 70 y.o.femalewith a past medical history significant for COPD/asthma, gastroparesis, HFpEF, IDDM, HTN, CKD III baseline Cr 1.5, OSA on CPAP and noct O2, and anemia who presents with face rash for 3 days.determined to be herpes zoster.   Clinical Impression   This 70 yo female admitted with above presents to acute OT with deficits below (see OT problem list) thus affecting her PLOF of being able to do most of her basic ADLs on most days with occasional A from "[place]". She will benefit from acute OT with follow up [place] (pt refusing SNF).Pt gets extremely SOB with minimal exertion (but O2 sats did not go lower than 93% on 2 liters of O2 during activity).    Follow Up Recommendations  Supervision/Assistance - 24 hour;Home health OT;Other (comment) (really feel like pt needs SNF, but pt not agreeable)    Equipment Recommendations  3 in 1 bedside comode;Other (comment) (wide 3n1, portable O2 tank, ambulance transport home)       Precautions / Restrictions Precautions Precautions: Fall Precaution Comments: airborne precautions Restrictions Weight Bearing Restrictions: No      Mobility Bed Mobility Overal bed mobility: Needs Assistance Bed Mobility: Supine to Sit;Sit to Supine     Supine to sit: Modified independent (Device/Increase time) Sit to supine: Modified independent (Device/Increase time)   General bed mobility comments: HOB flat and no rail  Transfers Overall transfer level: Needs assistance Equipment used: Rolling walker (2 wheeled) Transfers: Sit to/from Stand Sit to Stand: Min assist         General transfer comment: increased time, significant rocking to gain momentum to get up from chair    Balance Overall balance assessment: Needs assistance Sitting-balance support: No  upper extremity supported;Feet supported Sitting balance-[name]: Fair     Standing balance support: Bilateral upper extremity supported Standing balance-[name]: Poor Standing balance comment: reliant on RW                            ADL Overall ADL's : Needs assistance/impaired Eating/Feeding: Independent;Sitting   Grooming: Set up;Sitting   Upper Body Bathing: Minimal assitance;Sitting Upper Body Bathing Details (indicate cue type and reason): due to body habitus Lower Body Bathing: Total assistance (min A sit<>stand)   Upper Body Dressing : Moderate assistance;Sitting   Lower Body Dressing: Total assistance (min A sit<>stand)   Toilet Transfer: Minimal assistance;Ambulation;RW Toilet Transfer Details (indicate cue type and reason): bed>around to sit in chair>back up and around to sit on side of bed Toileting- Clothing Manipulation and Hygiene: Maximal assistance (min A sit<>stand)                         Pertinent Vitals/Pain Pain Assessment: 0-10 Pain Score: 9  Pain Location: Bil LE Pain Descriptors / Indicators: Aching;Sore Pain Intervention(s): Monitored during session;Repositioned;Patient requesting pain meds-RN notified     Hand Dominance Right   Extremity/Trunk Assessment Upper Extremity Assessment Upper Extremity Assessment: Generalized weakness           Communication Communication Communication: No difficulties   Cognition Arousal/Alertness: Awake/alert Behavior During Therapy: WFL for tasks assessed/performed Overall Cognitive Status:  (pt with delayed responses at times)  Home Living Family/patient expects to be discharged to:: Private residence Living Arrangements: Other relatives Available Help at Discharge: Family Type of Home: House Home Access: Stairs to enter Technical brewer of Steps: 5 Entrance Stairs-Rails: Right Home Layout: One level     Bathroom  Shower/Tub: Tub/shower unit;Curtain Shower/tub characteristics: Architectural technologist: Standard     Home Equipment: Toilet riser;Walker - 2 wheels;Cane - single point   Additional Comments: pt lives in a 1 story home with 5-10 steps to enter with HR. pt lives with "teddy" who can provide 24/7 assist.      Prior Functioning/Environment Level of Independence: Needs assistance  Gait / Transfers Assistance Needed: Uses SPC for ambulation. ADL's / Homemaking Assistance Needed: Pt reports she does her own UBB/D and [name] sometimes has to A with LBB/D. Sister or other drives her to store, but she does not go in due to does not have a portable O2 tank   Comments: Wears 02 at home at baseline 2 liters. and is on Cpap at night        OT Problem List: Decreased strength;Obesity;Impaired balance (sitting and/or standing);Cardiopulmonary status limiting activity;Pain;Decreased activity tolerance   OT Treatment/Interventions: Self-care/ADL training;Therapeutic activities;Patient/family education;DME and/or AE instruction;Balance training;Energy conservation    OT Goals(Current goals can be found in the care plan section) Acute Rehab OT Goals Patient Stated Goal: to go home OT Goal Formulation: With patient Time For Goal Achievement: 12/10/15 Potential to Achieve Goals: Good  OT Frequency: Min 2X/week              End of Session Equipment Utilized During Treatment: Rolling walker (O2 2 liters) Nurse Communication: Other (comment);Patient requests pain meds (still recommending SNF, but pt not agreeable; pt needs a wide 3n1, portable O2 tank, and HHOT)  Activity Tolerance: Patient limited by fatigue (had to rest after only going 5 feet) Patient left: in bed;with call bell/phone within reach;with bed alarm set;with family/visitor present   Time: 4497-5300 OT Time Calculation (min): 31 min Charges:  OT Evaluation $OT Eval Moderate Complexity: 1 Procedure OT Treatments $Self Care/Home  Management : 8-22 mins  Almon Register [phone] 12/03/2015, 2:24 PM

## 2015-12-03 NOTE — Progress Notes (Signed)
**  Preliminary report by tech**  Left lower extremity venous duplex complete. The study was technically limited due to body habitus, edema, depth of vessels, patient pain tolerance, and acoustic shadow.  There is no obvious evidence of deep or superficial vein thrombosis involving the left lower extremity. All visualized vessels appear patent and compressible. There is no evidence of a Baker's cyst on the left.  12/03/15 3:52 PM Joann Gutierrez RVT

## 2015-12-03 NOTE — Progress Notes (Signed)
[facility]                                                                              Patient Demographics  Joann Gutierrez, is a 70 y.o. female, DOB - [birth year], FYB:[id number]  Admit date - 11/27/2015   Admitting Physician Edwin Dada, MD  Outpatient Primary MD for the patient is Kandice Hams, MD  Outpatient specialists:   LOS - 5  days    Chief Complaint  Patient presents with  . Angioedema       Brief summary   Joann Gutierrez is a 70 y.o. female with a past medical history significant for COPD/asthma, gastroparesis, HFpEF, IDDM, HTN, CKD III baseline Cr 1.5, OSA on CPAP and noct O2, and anemia  who presents with face rash for 3 days. The patient was in her usual health until 72 hours ago when she developed aching pain in her left ear and eye as well as gums "aching".  Then Saturday she developed a rash on her left face, starting with a "bump" on her left nasolabial fold that her daughter thought was from her CPAP machine.  This progressed to numerous fluid-filled vesicles on her left cheek, nose, left upper lip, and roof of mouth.  Today, this kept progressing so they came to the ER. -CT maxillofacial protocol showed soft tissue swelling in the left face without abscess or periororbital cellulitis -She was given vancomycin for cellulitis And patient was admitted for further workup.   Assessment & Plan   Herpes zoster of V2 distribution with periorbital swelling, superimposed infection with mild blepharoconjunctivitis -  Possibly with some bacterial superinfection/impetigo.The rash has significantly improved   - Appreciate ophthalmology recommendations, recommended bacitracin ointment twice a day to affected areas - Appreciate ID recommendations, will continue acyclovir - Cefazolin is discontinued  Acute encephalopathy: Likely due to uremia and nephrotoxicity from acyclovir and multiple medications -Resolved, back to her baseline -  Now complaining of severe neuropathy pain, restarted Neurontin, tramadol  Left leg pain - Restarted Neurontin, tramadol, check Doppler ultrasound of the left lower extremity to rule out DVT  OSA:  -Continue CPAP at night with bleed in O2   Acute on chronic CKD:  - Creatinine significantly improved however now complaining of shortness of breath and lower leg edema - Discontinued IV fluids, restart Lasix 80 mg daily. continue to hold metolazone, losartan  Gastroparesis:  - Hold Reglan for now   Anemia:  Anemia of chronic disease and renal disease.  Stable.  At baseline. -Continue iron   HTN:  BP currently stable, continue Imdur, hydralazine, Cardura -Continue statin, aspirin, Zetia  COPD/Asthma:  -Continue Symbicort as formulary alternative -Continue montelukast  IDDM: -Continue Lantus -SSI with meals   Chronic diastolic CHF: Appears euvolemic.  EF 60-65% last May. - Restart Lasix   Code Status: Full CODE STATUS DVT Prophylaxis:  Lovenox  Family Communication: Discussed in detail with the patient, all imaging results, lab results explained to the patient    Disposition Plan: PT recommending skilled nursing facility  Time Spent in minutes   25 minutes  Procedures:    Consultants:    Ophthalmology  Antimicrobials:      Medications  Scheduled Meds: . aspirin EC  81 mg Oral Daily  . atorvastatin  10 mg Oral Daily  . bacitracin   Topical BID  . donepezil  10 mg Oral QHS  . doxazosin  2 mg Oral QHS  . ezetimibe  10 mg Oral QHS  . feeding supplement (ENSURE ENLIVE)  237 mL Oral BID BM  . ferrous sulfate  325 mg Oral Q breakfast  . furosemide  80 mg Oral Daily  . gabapentin  300 mg Oral BID  . hydrALAZINE  100 mg Oral TID  . insulin aspart  0-15 Units Subcutaneous TID WC  . insulin aspart  0-5 Units Subcutaneous QHS  . insulin glargine  15 Units Subcutaneous QHS  . isosorbide mononitrate  120 mg Oral Daily  . mouth rinse  15 mL Mouth Rinse BID   . mometasone-formoterol  2 puff Inhalation BID  . montelukast  10 mg Oral Daily  . ofloxacin  2 drop Left Ear QID  . raloxifene  60 mg Oral Daily  . valACYclovir  1,000 mg Oral Daily   Continuous Infusions:  PRN Meds:.acetaminophen **OR** acetaminophen, hydrALAZINE, ondansetron **OR** ondansetron (ZOFRAN) IV, traMADol   Antibiotics   Anti-infectives    Start     Dose/Rate Route Frequency Ordered Stop   12/03/15 0000  valACYclovir (VALTREX) 1000 MG tablet     1,000 mg Oral Daily 12/03/15 0802     12/02/15 1300  valACYclovir (VALTREX) tablet 1,000 mg     1,000 mg Oral Daily 12/02/15 1257     12/02/15 1245  acyclovir (ZOVIRAX) 800 mg in dextrose 5 % 150 mL IVPB  Status:  Discontinued     10 mg/kg  80 kg (Adjusted) 166 mL/hr over 60 Minutes Intravenous Every 24 hours 12/01/15 1406 12/02/15 1257   11/28/15 1200  acyclovir (ZOVIRAX) 800 mg in dextrose 5 % 150 mL IVPB  Status:  Discontinued     10 mg/kg  80 kg (Adjusted) 166 mL/hr over 60 Minutes Intravenous Every 12 hours 11/28/15 0050 12/01/15 1406   11/28/15 1000  ceFAZolin (ANCEF) 500 mg in dextrose 5 % 100 mL IVPB  Status:  Discontinued     500 mg 210 mL/hr over 30 Minutes Intravenous Every 12 hours 11/28/15 0156 12/02/15 1257   11/28/15 0100  acyclovir (ZOVIRAX) 800 mg in dextrose 5 % 150 mL IVPB     10 mg/kg  80 kg (Adjusted) 166 mL/hr over 60 Minutes Intravenous NOW 11/28/15 0050 11/28/15 0223   11/28/15 0045  valACYclovir (VALTREX) tablet 1,000 mg  Status:  Discontinued     1,000 mg Oral Daily 11/28/15 0036 11/28/15 0038   11/27/15 2200  vancomycin (VANCOCIN) IVPB 1000 mg/200 mL premix     1,000 mg 200 mL/hr over 60 Minutes Intravenous  Once 11/27/15 2150 11/28/15 0044        Subjective:   Joann Gutierrez was seen and examined today.Alert and oriented, back to baseline mental status. Now complaining of neuropathic pain in the legs. Left ear pain is improving.  Rash is improving. No nausea, vomiting.  No chest pain,  shortness of breath, no new weakness, and diarrhea   Objective:   Vitals:   12/03/15 0100 12/03/15 0429 12/03/15 0823 12/03/15 1000  BP: 133/76 127/63  137/68  Pulse: 80 87  88  Resp: 20 (!) 22  20  Temp: 98.7 F (37.1 C) 98.1 F (36.7 C)  97.7 F (36.5 C)  TempSrc: Oral Oral  Oral  SpO2: 98% 98% 95% 97%  Weight:      Height:       No intake or output data in the 24 hours ending 12/03/15 1138   Wt Readings from Last 3 Encounters:  11/28/15 117.9 kg (259 lb 14.8 oz)  10/16/15 124.6 kg (274 lb 12.8 oz)  07/13/15 126.4 kg (278 lb 9.6 oz)     Exam  General: Alert and oriented 3  NAD  HEENT:  PERRLA, EOMI, Rash improving   Neck:   Cardiovascular: S1 S2 clear, RRR  Respiratory: CTAB  Gastrointestinal: Soft, mild diffuse tenderness , nondistended, + bowel sounds  Ext: no cyanosis clubbing or edema  Neuro: strength 5/5 upper and lower extremities bilaterally   Skin: as above   Psych: alert and oriented 3   Data Reviewed:  I have personally reviewed following labs and imaging studies  Micro Results Recent Results (from the past 240 hour(s))  Culture, blood (routine x 2)     Status: None   Collection Time: 11/27/15 10:00 PM  Result Value Ref Range Status   Specimen Description BLOOD RIGHT ARM  Final   Special Requests BOTTLES DRAWN AEROBIC AND ANAEROBIC 5CC  Final   Culture NO GROWTH 5 DAYS  Final   Report Status 12/02/2015 FINAL  Final  Culture, blood (routine x 2)     Status: None   Collection Time: 11/27/15 10:22 PM  Result Value Ref Range Status   Specimen Description BLOOD RIGHT HAND  Final   Special Requests BOTTLES DRAWN AEROBIC AND ANAEROBIC 5CC  Final   Culture NO GROWTH 5 DAYS  Final   Report Status 12/02/2015 FINAL  Final    Radiology Reports Ct Maxillofacial Wo Contrast  Result Date: 11/27/2015 CLINICAL DATA:  70 y/o F; left-sided facial swelling with onset Friday is increasing and ulcer to tender skin on nose. EXAM: CT MAXILLOFACIAL  WITHOUT CONTRAST TECHNIQUE: Multidetector CT imaging of the maxillofacial structures was performed. Multiplanar CT image reconstructions were also generated. A small metallic BB was placed on the right temple in order to reliably differentiate right from left. COMPARISON:  None. FINDINGS: Osseous: No fracture or mandibular dislocation. No destructive process. Orbits: No intraorbital traumatic or inflammatory finding. Sinuses: Mild sphenoid and maxillary sinus mucosal thickening. Soft tissues: Centered within the soft tissues of the left nasolabial fold deep to dermal irregularities in the left aspect of the nose there is soft tissue thickening and induration (series 2, image 43). Surrounding this area extending superiorly into periorbital soft tissues, laterally over the malar subcutaneous fat, inferiorly into the left superior lip there is fat stranding and dermal thickening compatible with inflammatory change. No discrete low-attenuation fluid collection is identified. Limited intracranial: No significant or unexpected finding. Calcific atherosclerosis of carotid siphons. IMPRESSION: Inflammatory changes centered in the soft tissues of the left nasal ala and left nasal labial fold extending superiorly to left periorbital soft tissues, laterally over the cheek, and inferiorly into the left upper lip probably representing cellulitis. No discrete low-attenuation fluid collection is identified. No infiltration of the intraorbital space or deep cervical spaces. No osseous erosion. Electronically Signed   By: Kristine Garbe M.D.   On: 11/27/2015 22:58    Lab Data:  CBC:  Recent Labs Lab 11/27/15 2149 11/29/15 0626  WBC 6.7 7.9  NEUTROABS 3.8 4.7  HGB 11.7* 11.1*  HCT 36.8 34.9*  MCV 73.9* 73.8*  PLT 157 623*   Basic Metabolic Panel:  Recent Labs Lab 11/27/15 2149 11/29/15 0626  12/01/15 1217 12/02/15 0417 12/03/15 0252  NA 141 139 136 137 139  K 4.0 3.6 4.1 4.1 4.0  CL 104 104 104  105 106  CO2 26 23 22 22 24   GLUCOSE 195* 149* 129* 136* 137*  BUN 27* 32* 63* 68* 65*  CREATININE 1.83* 2.13* 3.99* 3.31* 1.97*  CALCIUM 8.6* 8.0* 7.9* 8.0* 8.2*   GFR: Estimated Creatinine Clearance: 33.6 mL/min (by C-G formula based on SCr of 1.97 mg/dL (H)). Liver Function Tests:  Recent Labs Lab 11/27/15 2149  AST 16  ALT 9*  ALKPHOS 78  BILITOT 0.6  PROT 8.2*  ALBUMIN 3.5   No results for input(s): LIPASE, AMYLASE in the last 168 hours. No results for input(s): AMMONIA in the last 168 hours. Coagulation Profile: No results for input(s): INR, PROTIME in the last 168 hours. Cardiac Enzymes: No results for input(s): CKTOTAL, CKMB, CKMBINDEX, TROPONINI in the last 168 hours. BNP (last 3 results) No results for input(s): PROBNP in the last 8760 hours. HbA1C: No results for input(s): HGBA1C in the last 72 hours. CBG:  Recent Labs Lab 12/02/15 0631 12/02/15 1121 12/02/15 1625 12/02/15 2030 12/03/15 1113  GLUCAP 130* 160* 164* 137* 157*   Lipid Profile: No results for input(s): CHOL, HDL, LDLCALC, TRIG, CHOLHDL, LDLDIRECT in the last 72 hours. Thyroid Function Tests: No results for input(s): TSH, T4TOTAL, FREET4, T3FREE, THYROIDAB in the last 72 hours. Anemia Panel: No results for input(s): VITAMINB12, FOLATE, FERRITIN, TIBC, IRON, RETICCTPCT in the last 72 hours. Urine analysis:    Component Value Date/Time   COLORURINE YELLOW 12/02/2015 [place] 12/02/2015 0651   LABSPEC 1.019 12/02/2015 0651   PHURINE 5.0 12/02/2015 0651   GLUCOSEU NEGATIVE 12/02/2015 0651   GLUCOSEU >=1000 01/02/2009 1101   HGBUR NEGATIVE 12/02/2015 0651   HGBUR negative 03/25/2008 [place] 12/02/2015 0651   KETONESUR NEGATIVE 12/02/2015 0651   PROTEINUR NEGATIVE 12/02/2015 0651   UROBILINOGEN 0.2 09/14/2013 1632   NITRITE NEGATIVE 12/02/2015 0651   LEUKOCYTESUR NEGATIVE 12/02/2015 4492     Kier Smead M.D. [facility] 12/03/2015, 11:38  AM  Pager: [phone] Between 7am to 7pm - call Pager - [phone]  After 7pm go to [email or web address] - password [email or web address]  Call night coverage person covering after 7pm

## 2015-12-03 NOTE — Evaluation (Signed)
Physical Therapy Evaluation Patient Details Name: ICESS BERTONI MRN: [id number] DOB: 1945/03/24 Today's Date: 12/03/2015   History of Present Illness  Damiyah Ditmars Henryhandis a 70 y.o.femalewith a past medical history significant for COPD/asthma, gastroparesis, HFpEF, IDDM, HTN, CKD III baseline Cr 1.5, OSA on CPAP and noct O2, and anemia who presents with face rash for 3 days. Pt suspected to have herpes voster of V2, suspected with impetigo.  Clinical Impression  Pt admitted with    Follow Up Recommendations SNF;Supervision/Assistance - 24 hour    Equipment Recommendations  None recommended by PT    Recommendations for Other Services       Precautions / Restrictions Precautions Precautions: Fall Precaution Comments: airborne precautions Restrictions Weight Bearing Restrictions: No      Mobility  Bed Mobility Overal bed mobility: Needs Assistance Bed Mobility: Sit to Supine       Sit to supine: Min assist   General bed mobility comments: pt up in chair upon PT arrival. pt required increased time to bring LEs back into bed. pt required minA to scoot up to [place] Overall transfer level: Needs assistance Equipment used: Rolling walker (2 wheeled) Transfers: Sit to/from Stand Sit to Stand: Min assist         General transfer comment: increased time, significant rocking to gain momentum to get up from chair, v/c's to push up from arm rests  Ambulation/Gait Ambulation/Gait assistance: Min assist Ambulation Distance (Feet): 8 Feet Assistive device: Rolling walker (2 wheeled) Gait Pattern/deviations: Step-to pattern;Decreased stride length;Antalgic;Trunk flexed;Wide base of support Gait velocity: slow Gait velocity interpretation: Below normal speed for age/gender General Gait Details: pt very labored with significant SOB and LE pain limited amb tolerance. SpO2 at 93% on 2Lo2 via [place] upon return to sitting  Stairs            Wheelchair  Mobility    Modified Rankin (Stroke Patients Only)       Balance Overall balance assessment: Needs assistance         Standing balance support: Bilateral upper extremity supported Standing balance-[name]: Poor Standing balance comment: requires use of RW                             Pertinent Vitals/Pain Pain Assessment: 0-10 Pain Score: 10-Worst pain ever Pain Location: bilat LE Pain Descriptors / Indicators: Aching;Burning Pain Intervention(s): Patient requesting pain meds-RN notified;RN gave pain meds during session    Home Living Family/patient expects to be discharged to:: Skilled nursing facility                 Additional Comments: pt lives in a 1 story home with 5-10 steps to enter with HR. pt lives with "teddy" who can provide 24/7 assist. Pt with tub shower and elevated commode.     Prior Function Level of Independence: Needs assistance   Gait / Transfers Assistance Needed: Uses SPC for ambulation.  ADL's / Homemaking Assistance Needed: pt reports she does her own dressing and bathing, sister drives her to the store and she sits in the car while her sister shops  Comments: Wears 02 at home at baseline. and is on Cpap at night     Hand Dominance   Dominant Hand: Right    Extremity/Trunk Assessment   Upper Extremity Assessment: Generalized weakness           Lower Extremity Assessment: Generalized weakness (grossly 3/5, limited by pain)  Cervical / Trunk Assessment: Normal  Communication   Communication: No difficulties  Cognition Arousal/Alertness: Awake/alert Behavior During Therapy: Flat affect Overall Cognitive Status: No family/caregiver present to determine baseline cognitive functioning (pt appears to be delayed with comprehension deficits)                      General Comments General comments (skin integrity, edema, etc.): pt with open skin tears on L side of face    Exercises     Assessment/Plan     PT Assessment Patient needs continued PT services  PT Problem List Decreased strength;Decreased range of motion;Decreased activity tolerance;Decreased balance;Decreased mobility          PT Treatment Interventions DME instruction;Gait training;Stair training;Functional mobility training;Therapeutic activities;Therapeutic exercise    PT Goals (Current goals can be found in the Care Plan section)  Acute Rehab PT Goals Patient Stated Goal: home PT Goal Formulation: With patient Time For Goal Achievement: 12/10/15 Potential to Achieve Goals: Good    Frequency Min 3X/week   Barriers to discharge Decreased caregiver support;Inaccessible home environment pt has 5-10 steps to get into house and long walks from the car     Co-evaluation               End of Session Equipment Utilized During Treatment: Gait belt Activity Tolerance: Patient limited by pain;Patient limited by fatigue Patient left: in bed;with call bell/phone within reach;with bed alarm set Nurse Communication: Mobility status         Time: 9371-6967 PT Time Calculation (min) (ACUTE ONLY): 32 min   Charges:   PT Evaluation $PT Eval Moderate Complexity: 1 Procedure PT Treatments $Gait Training: 8-22 mins   PT G CodesKingsley Callander 12/03/2015, 9:03 AM   Kittie Plater, PT, DPT Pager #: [phone] Office #: [phone]

## 2015-12-03 NOTE — Progress Notes (Signed)
SATURATION QUALIFICATIONS: (This note is used to comply with regulatory documentation for home oxygen)  Patient Saturations on Room Air at Rest = 100%  Patient Saturations on Room Air while Ambulating = 85%  Patient Saturations on 2 Liters of oxygen while Ambulating = 98%  Please briefly explain why patient needs home oxygen: Patient's oxygen saturation desaturates when patient ambulates, patient also becomes short of breath after short ambulation and requires oxygen to get saturations back at 92% or above.

## 2015-12-03 NOTE — Clinical Social Work Note (Signed)
Clinical Social Work Assessment  Patient Details  Name: Joann Gutierrez MRN: [id number] Date of Birth: [birth year]  Date of referral:  12/03/15               Reason for consult:  Facility Placement                Permission sought to share information with:  Family Supports Permission granted to share information::  Yes, Verbal Permission Granted  Name::     Joann Gutierrez  Relationship::  Spouse? (per patient report)  Contact Information:  [phone]  Housing/Transportation Living arrangements for the past 2 months:  [place] of Information:  Patient Patient Interpreter Needed:  None Criminal Activity/Legal Involvement Pertinent to Current Situation/Hospitalization:  No - Comment as needed Significant Relationships:  Friend, Siblings, Spouse Lives with:  Spouse Do you feel safe going back to the place where you live?  Yes Need for family participation in patient care:  Yes (Comment)  Care giving concerns:  No family/friends currently at bedside.   Social Worker assessment / plan:  Holiday representative met with patient at bedside to offer support and discuss patient needs at discharge.  CSW discussed therapy recommendations for SNF placement.  Patient states that she lives at home with [name] (spouse) and has [place].  Patient is not agreeable to SNF placement at this time - patient tearfully states that she used to work at [employer] [facility] [employer] and will not be placed in a nursing facility after her experiences.  Patient is open to continuing home health and maximum care in the home, but not to placement.    Clinical Social Worker will sign off for now as social work intervention is no longer needed. Please consult [place] again if new need arises.  Employment status:  Retired Forensic scientist:  Medicare PT Recommendations:  [place] / Referral to community resources:  [place]  Patient/Family's Response to  care:  Patient very appreciative for CSW support and concern, but kindly refused SNF.  Patient overly tearful about her current physical look and sores on her face.  Patient/Family's Understanding of and Emotional Response to Diagnosis, Current Treatment, and Prognosis:  Patient does not have a full understanding of her current barriers and potential limitations with return home at discharge, however emotionally patient could benefit from return home.  Emotional Assessment Appearance:  Appears older than stated age Attitude/Demeanor/Rapport:  Apprehensive, Crying Affect (typically observed):  Anxious, Apprehensive, In denial, Tearful/Crying Orientation:  Oriented to Self, Oriented to Situation, Oriented to Place, Oriented to  Time Alcohol / Substance use:  Not Applicable Psych involvement (Current and /or in the community):  No (Comment)  Discharge Needs  Concerns to be addressed:  Discharge Planning Concerns Readmission within the last 30 days:  No Current discharge risk:  Physical Impairment Barriers to Discharge:  Continued Medical Work up  [employer], [place]

## 2015-12-04 ENCOUNTER — Encounter: Payer: Self-pay | Admitting: *Deleted

## 2015-12-04 LAB — BASIC METABOLIC PANEL
ANION GAP: 9 (ref 5–15)
BUN: 71 mg/dL — AB (ref 6–20)
CALCIUM: 8.4 mg/dL — AB (ref 8.9–10.3)
CO2: 24 mmol/L (ref 22–32)
Chloride: 103 mmol/L (ref 101–111)
Creatinine, Ser: 2.31 mg/dL — ABNORMAL HIGH (ref 0.44–1.00)
GFR calc Af Amer: 23 mL/min — ABNORMAL LOW (ref >60)
GFR calc non Af Amer: 20 mL/min — ABNORMAL LOW (ref >60)
GLUCOSE: 125 mg/dL — AB (ref 65–99)
Potassium: 4.2 mmol/L (ref 3.5–5.1)
Sodium: 136 mmol/L (ref 135–145)

## 2015-12-04 LAB — GLUCOSE, CAPILLARY
GLUCOSE-CAPILLARY: 130 mg/dL — AB (ref 65–99)
Glucose-Capillary: 122 mg/dL — ABNORMAL HIGH (ref 65–99)
Glucose-Capillary: 155 mg/dL — ABNORMAL HIGH (ref 65–99)
Glucose-Capillary: 144 mg/dL — ABNORMAL HIGH (ref 65–99)
Glucose-Capillary: 121 mg/dL — ABNORMAL HIGH (ref 65–99)

## 2015-12-04 MED ORDER — GI COCKTAIL ~~LOC~~
30.0000 mL | Freq: Three times a day (TID) | ORAL | Status: DC | PRN
  Administered 2015-12-04: 30 mL via ORAL
  Filled 2015-12-04 (×2): qty 30

## 2015-12-04 MED ORDER — FUROSEMIDE 80 MG PO TABS: 80.0000 mg | ORAL_TABLET | Freq: Every day | ORAL | Status: DC

## 2015-12-04 MED ORDER — IPRATROPIUM-ALBUTEROL 0.5-2.5 (3) MG/3ML IN SOLN
3.0000 mL | Freq: Four times a day (QID) | RESPIRATORY_TRACT | Status: DC
  Administered 2015-12-04: 3 mL via RESPIRATORY_TRACT
  Filled 2015-12-04 (×2): qty 3

## 2015-12-04 MED ORDER — TRAMADOL HCL 50 MG PO TABS: 50.0000 mg | ORAL_TABLET | Freq: Three times a day (TID) | ORAL | 0 refills | Status: DC | PRN

## 2015-12-04 MED ORDER — FUROSEMIDE 80 MG PO TABS: 80.0000 mg | ORAL_TABLET | Freq: Two times a day (BID) | ORAL | Status: DC

## 2015-12-04 MED ORDER — ALBUTEROL SULFATE HFA 108 (90 BASE) MCG/ACT IN AERS: 2.0000 | INHALATION_SPRAY | Freq: Four times a day (QID) | RESPIRATORY_TRACT | 2 refills | Status: DC | PRN

## 2015-12-04 MED ORDER — POLYVINYL ALCOHOL 1.4 % OP SOLN: 1.0000 [drp] | OPHTHALMIC | 0 refills | Status: DC | PRN

## 2015-12-04 MED ORDER — VALACYCLOVIR HCL 1 G PO TABS: 1000.0000 mg | ORAL_TABLET | Freq: Every day | ORAL | 0 refills | Status: DC

## 2015-12-04 MED ORDER — FUROSEMIDE 80 MG PO TABS
80.0000 mg | ORAL_TABLET | Freq: Two times a day (BID) | ORAL | Status: DC
  Administered 2015-12-04: 80 mg via ORAL
  Filled 2015-12-04: qty 1

## 2015-12-04 NOTE — Progress Notes (Signed)
Received a phone call from [facility] at 1345 that patient is not in their Medicare covered zip code and they are no longer able to provide her services. They stated they had been trying to contact the patient without success to inform her of the need to change oxygen companies. CM spoke to patients husband and informed him of the need to change oxygen companies. He asked to use [facility]. Jermaine with [facility] DME notified and he is going to set up delivery of the concentrator for this evening and provide a tank for transportation home. Pt still has [place] equipment at home and can use this until [facility] delivers their equipment. Husband given number to call and have [facility] pick up their oxygen equipment once [facility] delivers the new equipment to the home.  Bedside RN updated.

## 2015-12-04 NOTE — Consult Note (Addendum)
   [facility] CM Inpatient Consult   12/04/2015  Joann Gutierrez [birth year] [id number]    [facility] Care Management follow up. Went to bedside to speak with patient prior to discharge. Patient was asleep on and off during bedside encounter. Her fiance, Joann Gutierrez, was at bedside. Received permission from [name] to speak with [name] on her behalf. Explained [facility] Care Management program. [name] was active with [place] Management in the past. Written consent obtained for [place] Management program services. Explained that she will receive post hospital discharge calls and will be evaluated for home visits. Explained [facility] Care Management will not interfere or replace services provided by home health. [facility] Care Management packet, contact information, and 24-hr nurse line magnet provided.   [name] endorses he takes patient to MD appointments. Medications are obtained thru mail order pharmacy thru [facility]. Denies having issues with affording medications. Confirmed Primary Care MD as [name]. Patient lives with fiance. Confirmed best contact numbers as [phone].  Spoke with inpatient RNCM who confirms patient will have home health services thru [facility] as well. Discussed that patient  declined recommendations for SNF.Inpatient RNCM also informs writer that there is questionable hoarding at patient's home. Of note, patient declined recommendation for going to SNF at discharge. Will request for patient to be assigned to [facility] as well. Patient is high risk for readmission. [name] has history of cellulitis of face, herpes zoster, HTN, chronic obstructive asthma, CKD, and IDDM.   [name], MSN-Ed, RN,BSN [facility] Liaison [phone]

## 2015-12-04 NOTE — Progress Notes (Signed)
Went over discharge orders with her and her husband at her bedside. S/S of infections Increase fever , any foul odor or drainage from her face or rash areas., When to make MD appointments, when to call 911 and RX were given for them to pick up at their pharmacy. Daughter is a nurse so I encouraged to please let her see the discharge  Orders also in case there would be questions after discharge to home. IV removed waiting for 02 from [place] . GI cocktail given for ingestion from earlier complaint

## 2015-12-04 NOTE — Progress Notes (Signed)
Occupational Therapy Treatment Patient Details Name: Joann Gutierrez MRN: [id number] DOB: Nov 03, 1945 Today's Date: 12/04/2015    History of present illness Phoenyx Melka Henryhandis a 70 y.o.femalewith a past medical history significant for COPD/asthma, gastroparesis, HFpEF, IDDM, HTN, CKD III baseline Cr 1.5, OSA on CPAP and noct O2, and anemia who presents with face rash for 3 days.   OT comments  Educated pt and sister on energy conservation and compensatory techniques to increase safety with ADL after D/C. Pt/sister verbalized understanding. Recommend follow up with [place].   Follow Up Recommendations  Home health OT;Supervision/Assistance - 24 hour    Equipment Recommendations  3 in 1 bedside comode;Other (comment)    Recommendations for Other Services      Precautions / Restrictions Precautions Precautions: Fall          Balance                                   ADL Overall ADL's : Needs assistance/impaired                                       General ADL Comments: Educated pt and sister on energy conservation techniques. sister states she is comfortable assisting her as needed with ADL. [name] also present for education.   Pt declined getting OOB because she had been OOB earlier today and wanted to rest in bed.       Vision                     Perception     Praxis      Cognition   Behavior During Therapy: WFL for tasks assessed/performed Overall Cognitive Status: Difficult to assess                       Extremity/Trunk Assessment               Exercises Other Exercises Other Exercises: Educated on BUE general strengthening ex with theraband. Pt/sister verbalized understanding.    Shoulder Instructions       General Comments      Pertinent Vitals/ Pain       Pain Assessment: Faces Faces Pain Scale: Hurts little more Pain Location: stomach Pain Descriptors / Indicators: Burning Pain  Intervention(s): Limited activity within patient's tolerance  Home Living                                          Prior Functioning/Environment              Frequency  Min 2X/week        Progress Toward Goals  OT Goals(current goals can now be found in the care plan section)  Progress towards OT goals: Progressing toward goals  Acute Rehab OT Goals Patient Stated Goal: to go home OT Goal Formulation: With patient Time For Goal Achievement: 12/10/15 Potential to Achieve Goals: Good ADL Goals Pt Will Perform Grooming: with min guard assist;standing Pt Will Transfer to Toilet: with min guard assist;ambulating;bedside commode Pt Will Perform Toileting - Clothing Manipulation and hygiene: with mod assist Additional ADL Goal #1: Pt will be aware of energy conservation strategies from handout that would be of benefit to her  Plan Discharge plan remains appropriate    Co-evaluation                 End of Session     Activity Tolerance Patient limited by lethargy   Patient Left in bed;with call bell/phone within reach;with family/visitor present   Nurse Communication Patient requests pain meds (for stomach)        Time: 1150-1200 OT Time Calculation (min): 10 min  Charges: OT General Charges $OT Visit: 1 Procedure OT Treatments $Self Care/Home Management : 8-22 mins  Ishanvi Mcquitty,HILLARY 12/04/2015, 1:09 PM   [facility], OTR/L  [phone] 12/04/2015

## 2015-12-04 NOTE — Discharge Summary (Signed)
Physician Discharge Summary   Patient ID: Joann Gutierrez MRN: [id number] DOB/AGE: May 22, 1945 70 y.o.  Admit date: 11/27/2015 Discharge date: 12/04/2015  Primary Care Physician:  Kandice Hams, MD  Discharge Diagnoses:    . Cellulitis of face . Obstructive sleep apnea . Chronic obstructive asthma ([place]) . Gastroparesis . Acute on CKD (chronic kidney disease), stage III . Essential hypertension . Severe obesity (BMI >= 40) (HCC) . Anemia of chronic disease . Herpes zoster with V2 distribution   Consults:  Infectious disease, Dr. Megan Salon Ophthalmology, Dr. Kathlen Mody  Neurology, Dr. Leonel Ramsay  Recommendations for Outpatient Follow-up:  1. Home health PT, OT, home health aide, RN, social work was arranged by case management 2. Please repeat CBC/BMET at next visit 3. Outpatient ophthalmology reevaluation and commended   DIET: Carb modified diet    Allergies:   Allergies  Allergen Reactions  . Morphine And Related Other (See Comments)    Family request not to be given, reports pt does not wake up when given   . Promethazine Hcl Other (See Comments)    REACTION: lethargy     DISCHARGE MEDICATIONS: Current Discharge Medication List    START taking these medications   Details  bacitracin ointment Apply topically 2 (two) times daily. Apply to the rash until resolved Qty: 120 g, Refills: 2    ofloxacin (OCUFLOX) 0.3 % ophthalmic solution Place 2 drops into the left ear 4 (four) times daily. Qty: 5 mL, Refills: 0    polyvinyl alcohol (LIQUIFILM TEARS) 1.4 % ophthalmic solution Place 1 drop into both eyes as needed for dry eyes. Qty: 15 mL, Refills: 0    valACYclovir (VALTREX) 1000 MG tablet Take 1 tablet (1,000 mg total) by mouth daily. X 3  more days Qty: 3 tablet, Refills: 0      CONTINUE these medications which have CHANGED   Details  furosemide (LASIX) 80 MG tablet Take 1 tablet (80 mg total) by mouth daily.    traMADol (ULTRAM) 50 MG tablet Take 1  tablet (50 mg total) by mouth every 8 (eight) hours as needed for moderate pain or severe pain. Reported on 02/07/2015 Qty: 30 tablet, Refills: 0      CONTINUE these medications which have NOT CHANGED   Details  albuterol (VENTOLIN HFA) 108 (90 BASE) MCG/ACT inhaler Inhale 2 puffs into the lungs every 6 (six) hours as needed for shortness of breath. Qty: 1 Inhaler, Refills: 11    aspirin EC 81 MG tablet Take 81 mg by mouth daily.    atorvastatin (LIPITOR) 10 MG tablet Take 10 mg by mouth daily.    Calcium Carbonate-Vit D-Min (CALTRATE PLUS PO) Take 600 mg by mouth every morning.     Cod Liver Oil CAPS Take 1 capsule by mouth daily.    donepezil (ARICEPT) 10 MG tablet Take 10 mg by mouth at bedtime.     doxazosin (CARDURA) 2 MG tablet Take 1 tablet by mouth at bedtime.    ezetimibe (ZETIA) 10 MG tablet Take 10 mg by mouth at bedtime.     ferrous sulfate 325 (65 FE) MG EC tablet Take 325 mg by mouth every morning. Reported on 01/25/2015    fluticasone (FLONASE) 50 MCG/ACT nasal spray Place 2 sprays into both nostrils daily. Qty: 16 g, Refills: 11    gabapentin (NEURONTIN) 300 MG capsule Take 1 capsule (300 mg total) by mouth 2 (two) times daily. Qty: 60 capsule, Refills: 5    hydrALAZINE (APRESOLINE) 100 MG tablet Take 100 mg by  mouth 3 (three) times daily.    insulin glargine (LANTUS) 100 UNIT/ML injection Inject 0.15 mLs (15 Units total) into the skin daily. Qty: 10 mL, Refills: 11    insulin NPH (HUMULIN N) 100 UNIT/ML injection Inject 2-13 Units into the skin 3 (three) times daily after meals. Sliding scale    isosorbide mononitrate (IMDUR) 120 MG 24 hr tablet Take 1 tablet (120 mg total) by mouth daily. PATIENT NEEDS TO CONTACT OFFICE FOR ADDITIONAL REFILLS Qty: 15 tablet, Refills: 0    KLOR-CON M20 20 MEQ tablet Take 1 tablet by mouth daily.     montelukast (SINGULAIR) 10 MG tablet Take 1 tablet (10 mg total) by mouth daily. Qty: 30 tablet, Refills: 11    Multiple  Vitamins-Minerals (MULTIVITAMIN GUMMIES ADULT) CHEW Chew 1 capsule by mouth daily.    nitroGLYCERIN (NITROSTAT) 0.4 MG SL tablet PLACE ONE TABLET UNDER THE TONGUE EVERY FIVE MINUTES AS NEEDED FOR CHEST PAIN Qty: 25 tablet, Refills: 1    Omega-3 Fatty Acids (FISH OIL) 1000 MG CAPS Take 1 capsule by mouth daily.    omeprazole (PRILOSEC) 20 MG capsule Take 1 capsule (20 mg total) by mouth 2 (two) times daily before a meal. Qty: 28 capsule, Refills: 0    raloxifene (EVISTA) 60 MG tablet Take 60 mg by mouth every morning.     SYMBICORT 160-4.5 MCG/ACT inhaler INHALE 2 PUFFS INTO THE LUNGS 2 TIMES DAILY Qty: 10.2 g, Refills: 4    Vitamin D, Ergocalciferol, (DRISDOL) 50000 UNITS CAPS capsule Take 50,000 Units by mouth every Monday.     OXYGEN Inhale into the lungs. CPAP with oxygen at bedtime      STOP taking these medications     aspirin 325 MG tablet      cyclobenzaprine (FLEXERIL) 10 MG tablet      losartan (COZAAR) 100 MG tablet      metoCLOPramide (REGLAN) 10 MG tablet      metolazone (ZAROXOLYN) 2.5 MG tablet      clarithromycin (BIAXIN) 500 MG tablet      metroNIDAZOLE (FLAGYL) 500 MG tablet      Na Sulfate-K Sulfate-Mg Sulf 17.5-3.13-1.6 GM/180ML SOLN          Brief H and P: For complete details please refer to admission H and P, but in brief Joann A Henryhandis a 70 y.o.femalewith a past medical history significant for COPD/asthma, gastroparesis, HFpEF, IDDM, HTN, CKD III baseline Cr 1.5, OSA on CPAP and noct O2, and anemia who presents with face rash for 3 days. The patient was in her usual health until 72 hours ago when she developed aching pain in her left ear and eye as well as gums "aching". Then Saturday she developed a rash on her left face, starting with a "bump" on her left nasolabial fold that her daughter thought was from her CPAP machine. This progressed to numerous fluid-filled vesicles on her left cheek, nose, left upper lip, and roof of mouth.  Today, this kept progressing so they came to the ER. -CT maxillofacial protocol showed soft tissue swelling in the left face without abscess or periororbital cellulitis -She was given vancomycin for cellulitisAnd patient was admitted for further workup.   Hospital Course:  Herpes zoster of V2 distribution with periorbital swelling, superimposed infection with mild blepharoconjunctivitis - Possibly with some bacterial superinfection/impetigo.The rash has significantly improved  - Ophthalmology consult was obtained, patient was seen by Dr. Marshall Cork. She was recommended bacitracin ointment twice a day to affected areas. Infectious disease  was also consulted. Patient was continued on acyclovir and cefazolin. Once the cellulitis was improved, cefazolin was discontinued. Acyclovir were was renal dosed by the pharmacy. She has only 3 days left of acyclovir.   Acute encephalopathy: Likely due to uremia and nephrotoxicity from acyclovir and multiple medications -Resolved, back to her baseline  Left leg pain - Restarted Neurontin, tramadol,  Doppler ultrasound of the left lower extremity negative for DVT   OSA: - Continue CPAP at night and home O2 evaluation was done. She qualifies for 2 L of home O2 with exertion    Acute on chronic CKDStage III: - Creatinine significantly improved however now complaining of shortness of breath and lower leg edema. Patient was restarted on Lasix. Metolazone, Reglan, losartan has been on hold. Patient likely has multiple factors for renal toxicity including acyclovir as well. Hopefully her creatinine will continue to improve once she finishes course of the acyclovir   Gastroparesis: - Hold Reglan for now due to renal insufficiency.    Anemia: Anemia of chronic disease and renal disease. Stable. At baseline. -Continue iron   HTN: BP currently stable, continue Imdur, hydralazine, Cardura -Continue statin, aspirin,  Zetia  COPD/Asthma: -Continue Symbicort as formulary alternative -Continue montelukast  IDDM: -Continue Lantus -SSI with meals   Chronic diastolic CHF: Appears euvolemic. EF 60-65% last May. continue Lasix.   Deconditioning and Debility PT evaluation recommended skilled nursing facility however patient adamantly refused rehabilitation or nursing facility. Home health PT, OT, home health aide, social work was arranged by the case management.    Day of Discharge BP (!) 107/53 (BP Location: Left Arm)   Pulse 80   Temp 99.7 F (37.6 C) (Oral)   Resp 18   Ht 5\' 4"  (1.626 m)   Wt 117.9 kg (259 lb 14.8 oz)   SpO2 93%   BMI 44.62 kg/m   Physical Exam: General: Alert and awake oriented x3 not in any acute distress. HEENT: rash on the left side of the face has significantly improved CVS: S1-S2 clear no murmur rubs or gallops Chest: clear to auscultation bilaterally, no wheezing rales or rhonchi Abdomen: soft nontender, nondistended, normal bowel sounds Extremities: no cyanosis, clubbing, 1+ edema noted bilaterally Neuro: Cranial nerves II-XII intact, no focal neurological deficits   The results of significant diagnostics from this hospitalization (including imaging, microbiology, ancillary and laboratory) are listed below for reference.    LAB RESULTS: Basic Metabolic Panel:  Recent Labs Lab 12/03/15 0252 12/04/15 0924  NA 139 136  K 4.0 4.2  CL 106 103  CO2 24 24  GLUCOSE 137* 125*  BUN 65* 71*  CREATININE 1.97* 2.31*  CALCIUM 8.2* 8.4*   Liver Function Tests:  Recent Labs Lab 11/27/15 2149  AST 16  ALT 9*  ALKPHOS 78  BILITOT 0.6  PROT 8.2*  ALBUMIN 3.5   No results for input(s): LIPASE, AMYLASE in the last 168 hours. No results for input(s): AMMONIA in the last 168 hours. CBC:  Recent Labs Lab 11/27/15 2149 11/29/15 0626  WBC 6.7 7.9  NEUTROABS 3.8 4.7  HGB 11.7* 11.1*  HCT 36.8 34.9*  MCV 73.9* 73.8*  PLT 157 130*   Cardiac  Enzymes: No results for input(s): CKTOTAL, CKMB, CKMBINDEX, TROPONINI in the last 168 hours. BNP: Invalid input(s): POCBNP CBG:  Recent Labs Lab 12/04/15 0839 12/04/15 1147  GLUCAP 144* 121*    Significant Diagnostic Studies:  Ct Maxillofacial Wo Contrast  Result Date: 11/27/2015 CLINICAL DATA:  70 y/o F; left-sided facial swelling  with onset Friday is increasing and ulcer to tender skin on nose. EXAM: CT MAXILLOFACIAL WITHOUT CONTRAST TECHNIQUE: Multidetector CT imaging of the maxillofacial structures was performed. Multiplanar CT image reconstructions were also generated. A small metallic BB was placed on the right temple in order to reliably differentiate right from left. COMPARISON:  None. FINDINGS: Osseous: No fracture or mandibular dislocation. No destructive process. Orbits: No intraorbital traumatic or inflammatory finding. Sinuses: Mild sphenoid and maxillary sinus mucosal thickening. Soft tissues: Centered within the soft tissues of the left nasolabial fold deep to dermal irregularities in the left aspect of the nose there is soft tissue thickening and induration (series 2, image 43). Surrounding this area extending superiorly into periorbital soft tissues, laterally over the malar subcutaneous fat, inferiorly into the left superior lip there is fat stranding and dermal thickening compatible with inflammatory change. No discrete low-attenuation fluid collection is identified. Limited intracranial: No significant or unexpected finding. Calcific atherosclerosis of carotid siphons. IMPRESSION: Inflammatory changes centered in the soft tissues of the left nasal ala and left nasal labial fold extending superiorly to left periorbital soft tissues, laterally over the cheek, and inferiorly into the left upper lip probably representing cellulitis. No discrete low-attenuation fluid collection is identified. No infiltration of the intraorbital space or deep cervical spaces. No osseous erosion.  Electronically Signed   By: Kristine Garbe M.D.   On: 11/27/2015 22:58    2D ECHO:   Disposition and Follow-up: Discharge Instructions    (HEART FAILURE PATIENTS) Call MD:  Anytime you have any of the following symptoms: 1) 3 pound weight gain in 24 hours or 5 pounds in 1 week 2) shortness of breath, with or without a dry hacking cough 3) swelling in the hands, feet or stomach 4) if you have to sleep on extra pillows at night in order to breathe.    Complete by:  As directed    Diet Carb Modified    Complete by:  As directed    Increase activity slowly    Complete by:  As directed        [place] D, MD. Schedule an appointment as soon as possible for a visit in 2 week(s).   Specialty:  Internal Medicine Contact information: [address] [place] [address] [place] [address] [phone]            Time spent on Discharge: 35 mins   Signed:   Eura Mccauslin M.D. [facility] 12/04/2015, 12:20 PM Pager: [phone]

## 2015-12-04 NOTE — Progress Notes (Signed)
CPAP refused. NOT in room.

## 2015-12-04 NOTE — Care Management Note (Signed)
Case Management Note  Patient Details  Name: Joann Gutierrez MRN: [id number] Date of Birth: September 30, 1945  Subjective/Objective:                    Action/Plan: PT/OT recommending SNF at discharge. Pt refusing and asking to go home. Pts husband and sister that assist her at home are in agreement with the patient discharging home. CM provided them a list of [place] agencies in the [facility] area and they stated they have used [facility] in the past and would like to use them again. Santiago Glad with [facility] notified and accepted the referral. Pt also with orders for 3 in 1 and and increase in her oxygen at home. Jermaine with [facility] notified and and will deliver the equipment to the room. Pt states she uses [facility] for her home oxygen and would like to continue with them. CM called [facility] and spoke to April. CM provided her with the information she requested and faxed the new orders and Saturation qualifications note. [facility] to deliver a tank to the hospital for transport home and to deliver more equipment to the home. [facility] states that the concentrator she has at home will work for her until they change out equipment this evening. CSW received phone call from patients daughter, Ihor Gully, and asked CM to call her back regarding [facility] services for the patient. CM called Shenia and she wanted to express concerns about the patients home. She states the home is a hoarder house and her mother only has small passages through the halls. CM informed her that the patient refused SNF rehab and that she was able to make her own decision about d/c. Daughter agreed and just wanted the [facility] agency to be aware of the situation at home and asked to have SW also placed in the home incase the environment were to force a change in the plan after d/c. She also wanted to make sure the [facility] agencies has her number for any issues. CM provided this to Santiago Glad with [facility].  Patients husband and sister to transport the patient home via private vehicle. Patients  son is to meet them at the house to assist in getting her inside. Will update the bedside RN   Expected Discharge Date:                  Expected Discharge Plan:  [place]  In-House Referral:     Discharge planning Services  CM Consult  Post Acute Care Choice:  Home Health, Durable Medical Equipment Choice offered to:  Patient, Spouse, Sibling, Adult Children  DME Arranged:  3-N-1, Oxygen DME Agency:  [place]. ([employer] supplies her oxygen )  HH Arranged:  RN, PT, OT, Nurse's Aide [place] Agency:  [facility]  Status of Service:  Completed, signed off  If discussed at [place] of Stay Meetings, dates discussed:    Additional Comments:  Pollie Friar, RN 12/04/2015, 12:02 PM

## 2015-12-04 NOTE — Progress Notes (Signed)
Patient c/o ingestion called MD for something to relieve her. Follow up with medication as ordered

## 2015-12-06 ENCOUNTER — Other Ambulatory Visit: Payer: Self-pay

## 2015-12-06 ENCOUNTER — Encounter: Payer: Self-pay | Admitting: *Deleted

## 2015-12-06 DIAGNOSIS — L03211 Cellulitis of face: Secondary | ICD-10-CM | POA: Diagnosis not present

## 2015-12-06 DIAGNOSIS — D573 Sickle-cell trait: Secondary | ICD-10-CM | POA: Diagnosis not present

## 2015-12-06 DIAGNOSIS — G4733 Obstructive sleep apnea (adult) (pediatric): Secondary | ICD-10-CM | POA: Diagnosis not present

## 2015-12-06 DIAGNOSIS — J449 Chronic obstructive pulmonary disease, unspecified: Secondary | ICD-10-CM | POA: Diagnosis not present

## 2015-12-06 DIAGNOSIS — Z9981 Dependence on supplemental oxygen: Secondary | ICD-10-CM | POA: Diagnosis not present

## 2015-12-06 DIAGNOSIS — Z7951 Long term (current) use of inhaled steroids: Secondary | ICD-10-CM | POA: Diagnosis not present

## 2015-12-06 DIAGNOSIS — E1142 Type 2 diabetes mellitus with diabetic polyneuropathy: Secondary | ICD-10-CM | POA: Diagnosis not present

## 2015-12-06 DIAGNOSIS — M1991 Primary osteoarthritis, unspecified site: Secondary | ICD-10-CM | POA: Diagnosis not present

## 2015-12-06 DIAGNOSIS — E1143 Type 2 diabetes mellitus with diabetic autonomic (poly)neuropathy: Secondary | ICD-10-CM | POA: Diagnosis not present

## 2015-12-06 DIAGNOSIS — Z87891 Personal history of nicotine dependence: Secondary | ICD-10-CM | POA: Diagnosis not present

## 2015-12-06 DIAGNOSIS — Z792 Long term (current) use of antibiotics: Secondary | ICD-10-CM | POA: Diagnosis not present

## 2015-12-06 DIAGNOSIS — D631 Anemia in chronic kidney disease: Secondary | ICD-10-CM | POA: Diagnosis not present

## 2015-12-06 DIAGNOSIS — B0239 Other herpes zoster eye disease: Secondary | ICD-10-CM | POA: Diagnosis not present

## 2015-12-06 DIAGNOSIS — I272 Pulmonary hypertension, unspecified: Secondary | ICD-10-CM | POA: Diagnosis not present

## 2015-12-06 DIAGNOSIS — H9193 Unspecified hearing loss, bilateral: Secondary | ICD-10-CM | POA: Diagnosis not present

## 2015-12-06 DIAGNOSIS — I130 Hypertensive heart and chronic kidney disease with heart failure and stage 1 through stage 4 chronic kidney disease, or unspecified chronic kidney disease: Secondary | ICD-10-CM | POA: Diagnosis not present

## 2015-12-06 DIAGNOSIS — K3184 Gastroparesis: Secondary | ICD-10-CM | POA: Diagnosis not present

## 2015-12-06 DIAGNOSIS — M810 Age-related osteoporosis without current pathological fracture: Secondary | ICD-10-CM | POA: Diagnosis not present

## 2015-12-06 DIAGNOSIS — I5032 Chronic diastolic (congestive) heart failure: Secondary | ICD-10-CM | POA: Diagnosis not present

## 2015-12-06 DIAGNOSIS — K5790 Diverticulosis of intestine, part unspecified, without perforation or abscess without bleeding: Secondary | ICD-10-CM | POA: Diagnosis not present

## 2015-12-06 DIAGNOSIS — F329 Major depressive disorder, single episode, unspecified: Secondary | ICD-10-CM | POA: Diagnosis not present

## 2015-12-06 DIAGNOSIS — E1122 Type 2 diabetes mellitus with diabetic chronic kidney disease: Secondary | ICD-10-CM | POA: Diagnosis not present

## 2015-12-06 DIAGNOSIS — N183 Chronic kidney disease, stage 3 (moderate): Secondary | ICD-10-CM | POA: Diagnosis not present

## 2015-12-06 DIAGNOSIS — Z794 Long term (current) use of insulin: Secondary | ICD-10-CM | POA: Diagnosis not present

## 2015-12-06 NOTE — Patient Outreach (Signed)
[facility]ann Gutierrez [facility]) Care Management  12/06/2015   Joann Gutierrez [birth year] [id number]  Subjective:  I got out the hospital on Monday. Joann Gutierrez is helping me a whole lot. I will need that information again about the foods I should not eat.  Objective:  Telephonic encounter.    Current Medications:  Current Outpatient Prescriptions  Medication Sig Dispense Refill  . albuterol (VENTOLIN HFA) 108 (90 BASE) MCG/ACT inhaler Inhale 2 puffs into the lungs every 6 (six) hours as needed for shortness of breath. 1 Inhaler 11  . aspirin EC 81 MG tablet Take 81 mg by mouth daily.    Marland Kitchen atorvastatin (LIPITOR) 10 MG tablet Take 10 mg by mouth daily.    . bacitracin ointment Apply topically 2 (two) times daily. Apply to the rash until resolved 120 g 2  . Calcium Carbonate-Vit D-Min (CALTRATE PLUS PO) Take 600 mg by mouth every morning.     Marland Kitchen Cod Liver Oil CAPS Take 1 capsule by mouth daily.    Marland Kitchen donepezil (ARICEPT) 10 MG tablet Take 10 mg by mouth at bedtime.     Marland Kitchen doxazosin (CARDURA) 2 MG tablet Take 1 tablet by mouth at bedtime.    Marland Kitchen ezetimibe (ZETIA) 10 MG tablet Take 10 mg by mouth at bedtime.     . ferrous sulfate 325 (65 FE) MG EC tablet Take 325 mg by mouth every morning. Reported on 01/25/2015    . fluticasone (FLONASE) 50 MCG/ACT nasal spray Place 2 sprays into both nostrils daily. 16 g 11  . furosemide (LASIX) 80 MG tablet Take 1 tablet (80 mg total) by mouth daily.    Marland Kitchen gabapentin (NEURONTIN) 300 MG capsule Take 1 capsule (300 mg total) by mouth 2 (two) times daily. 60 capsule 5  . hydrALAZINE (APRESOLINE) 100 MG tablet Take 100 mg by mouth 3 (three) times daily.    . insulin glargine (LANTUS) 100 UNIT/ML injection Inject 0.15 mLs (15 Units total) into the skin daily. (Patient taking differently: Inject 15 Units into the skin at bedtime. ) 10 mL 11  . insulin NPH (HUMULIN N) 100 UNIT/ML injection Inject 2-13 Units into the skin 3 (three) times daily after meals. Sliding scale     . isosorbide mononitrate (IMDUR) 120 MG 24 hr tablet Take 1 tablet (120 mg total) by mouth daily. PATIENT NEEDS TO CONTACT OFFICE FOR ADDITIONAL REFILLS 15 tablet 0  . KLOR-CON M20 20 MEQ tablet Take 1 tablet by mouth daily.     . montelukast (SINGULAIR) 10 MG tablet Take 1 tablet (10 mg total) by mouth daily. 30 tablet 11  . Multiple Vitamins-Minerals (MULTIVITAMIN GUMMIES ADULT) CHEW Chew 1 capsule by mouth daily.    . nitroGLYCERIN (NITROSTAT) 0.4 MG SL tablet PLACE ONE TABLET UNDER THE TONGUE EVERY FIVE MINUTES AS NEEDED FOR CHEST PAIN 25 tablet 1  . ofloxacin (OCUFLOX) 0.3 % ophthalmic solution Place 2 drops into the left ear 4 (four) times daily. 5 mL 0  . Omega-3 Fatty Acids (FISH OIL) 1000 MG CAPS Take 1 capsule by mouth daily.    Marland Kitchen omeprazole (PRILOSEC) 20 MG capsule Take 1 capsule (20 mg total) by mouth 2 (two) times daily before a meal. 28 capsule 0  . OXYGEN Inhale into the lungs. CPAP with oxygen at bedtime    . polyvinyl alcohol (LIQUIFILM TEARS) 1.4 % ophthalmic solution Place 1 drop into both eyes as needed for dry eyes. 15 mL 0  . raloxifene (EVISTA) 60 MG tablet Take 60 mg  by mouth every morning.     . SYMBICORT 160-4.5 MCG/ACT inhaler INHALE 2 PUFFS INTO THE LUNGS 2 TIMES DAILY 10.2 g 4  . traMADol (ULTRAM) 50 MG tablet Take 1 tablet (50 mg total) by mouth every 8 (eight) hours as needed for moderate pain or severe pain. Reported on 02/07/2015 30 tablet 0  . valACYclovir (VALTREX) 1000 MG tablet Take 1 tablet (1,000 mg total) by mouth daily. X 3  more days 3 tablet 0  . Vitamin D, Ergocalciferol, (DRISDOL) 50000 UNITS CAPS capsule Take 50,000 Units by mouth every Monday.      No current facility-administered medications for this visit.     Functional Status:  In your present state of health, do you have any difficulty performing the following activities: 12/06/2015 11/29/2015  Hearing? N N  Vision? Y N  Difficulty concentrating or making decisions? Y N  Walking or climbing  stairs? Y Y  Dressing or bathing? Y N  Doing errands, shopping? [name]  Preparing Food and eating ? Y -  Using the Toilet? N -  In the past six months, have you accidently leaked urine? N -  Do you have problems with loss of bowel control? N -  Managing your Medications? Y -  Managing your Finances? N -  Housekeeping or managing your Housekeeping? Y -  Some recent data might be hidden    Fall/Depression Screening: PHQ 2/9 Scores 12/06/2015 08/03/2015 06/05/2015 05/15/2015 04/11/2015 02/02/2015 12/08/2014  PHQ - 2 Score 0 0 0 0 0 0 1  PHQ- 9 Score - - - - - - -  Exception Documentation - - - - - - -   . Fall Risk  12/06/2015 08/03/2015 06/05/2015 05/15/2015 04/11/2015  Falls in the past year? No No - No Yes  Number falls in past yr: - - - - 1  Injury with Fall? - - - - No  Risk Factor Category  - - - - -  Risk for fall due to : Impaired balance/gait;Impaired vision;Impaired mobility - History of fall(s);Impaired balance/gait;Impaired mobility;Impaired vision;Medication side effect Impaired balance/gait;Medication side effect Impaired mobility  Follow up - - - - Falls prevention discussed   THN CM Care Plan Problem One   Flowsheet Row Most Recent Value  Care Plan Problem One  Patient had recent hospitalization for COPD exacerbation  Role Documenting the Problem One  Care Management Coordinator  Care Plan for Problem One  Active  THN Long Term Goal (31-90 days)  Patient will have no acute care admission for the next 31 days.  THN Long Term Goal Start Date  12/06/15  Interventions for Problem One Long Term Goal  Transition of care call completed.  scheduled initial home visit for later this month  THN CM Short Term Goal #2 (0-30 days)  In the next 21 days, patient and significant other will meet with [place] for heart failure education  [facility] CM Short Term Goal #2 Start Date  12/06/15  Interventions for Short Term Goal #2  scheduled home visit for later this month    [facility]  CM Care Plan Problem Two   Flowsheet Row Most Recent Value  Care Plan Problem Two  In the next 28 days, patient will weight 21 out of 28 times  Role Documenting the Problem Two  Care Management Coordinator     Assessment:  Patient had recent hospitalization for heart failure.  Plan:  Home visit for heart failure later this month

## 2015-12-08 ENCOUNTER — Inpatient Hospital Stay (HOSPITAL_COMMUNITY): Admission: RE | Admit: 2015-12-08 | Payer: Medicare Other | Source: Ambulatory Visit

## 2015-12-08 DIAGNOSIS — N183 Chronic kidney disease, stage 3 (moderate): Secondary | ICD-10-CM | POA: Diagnosis not present

## 2015-12-08 DIAGNOSIS — D631 Anemia in chronic kidney disease: Secondary | ICD-10-CM | POA: Diagnosis not present

## 2015-12-08 DIAGNOSIS — I5032 Chronic diastolic (congestive) heart failure: Secondary | ICD-10-CM | POA: Diagnosis not present

## 2015-12-08 DIAGNOSIS — I130 Hypertensive heart and chronic kidney disease with heart failure and stage 1 through stage 4 chronic kidney disease, or unspecified chronic kidney disease: Secondary | ICD-10-CM | POA: Diagnosis not present

## 2015-12-08 DIAGNOSIS — E1122 Type 2 diabetes mellitus with diabetic chronic kidney disease: Secondary | ICD-10-CM | POA: Diagnosis not present

## 2015-12-08 DIAGNOSIS — J449 Chronic obstructive pulmonary disease, unspecified: Secondary | ICD-10-CM | POA: Diagnosis not present

## 2015-12-11 ENCOUNTER — Encounter: Payer: Self-pay | Admitting: *Deleted

## 2015-12-11 ENCOUNTER — Other Ambulatory Visit: Payer: Self-pay

## 2015-12-11 ENCOUNTER — Other Ambulatory Visit: Payer: Self-pay | Admitting: *Deleted

## 2015-12-11 DIAGNOSIS — I10 Essential (primary) hypertension: Secondary | ICD-10-CM | POA: Diagnosis not present

## 2015-12-11 DIAGNOSIS — Z6841 Body Mass Index (BMI) 40.0 and over, adult: Secondary | ICD-10-CM | POA: Diagnosis not present

## 2015-12-11 DIAGNOSIS — M9080 Osteopathy in diseases classified elsewhere, unspecified site: Secondary | ICD-10-CM | POA: Diagnosis not present

## 2015-12-11 DIAGNOSIS — D638 Anemia in other chronic diseases classified elsewhere: Secondary | ICD-10-CM | POA: Diagnosis not present

## 2015-12-11 DIAGNOSIS — N184 Chronic kidney disease, stage 4 (severe): Secondary | ICD-10-CM | POA: Diagnosis not present

## 2015-12-11 DIAGNOSIS — E889 Metabolic disorder, unspecified: Secondary | ICD-10-CM | POA: Diagnosis not present

## 2015-12-11 NOTE — Patient Outreach (Signed)
[facility]) Care Management  12/11/2015  Joann Gutierrez 1945-07-06 [id number]   CSW was able to make initial contact with patient today to perform phone assessment, as well as assess and assist with social work needs and services.  CSW introduced self, explained role and types of services provided through [facility] [place] [facility] ([place] [place] [facility]).  CSW further explained to patient that CSW works with [name], [facility], also with [facility]. CSW then explained the reason for the call, indicating that Joann Gutierrez thought that patient would benefit from social work services and resources to assist with possible referrals to various community agencies and resources.  CSW obtained two HIPAA compliant identifiers from patient, which included patient's name and date of birth. Patient admits that she has been in-and-out of the hospital several times within the last six months, as a result of multiple co-morbidities.  CSW is aware that patient declined skilled nursing placement during recent hospital admission, despite recommendations otherwise.  Patient was agreeable to home health services through [facility].  Patient denied being able to identify any social work specific needs at present, reporting that she is "managing well on her own".  CSW provided patient with CSW's contact information, encouraging patient to contact CSW directly if social work needs arise in the near future.  Patient voiced understanding and was agreeable to this plan. CSW will perform a case closure on patient, as all goals of treatment have been met from social work standpoint and no additional social work needs have been identified at this time.  CSW will notify patient's RNCM with [facility], Joann Gutierrez of CSW's plans to close patient's case.  CSW will fax an update to patient's Primary Care Physician, Joann Gutierrez  to ensure  that they are aware of CSW's involvement with patient's plan of care.  CSW will submit a case closure request to Joann Gutierrez, Care Management Assistant with [facility], in the form of an [employer].  CSW will ensure that Joann Gutierrez is aware of Joann Gutierrez, RNCM with [facility], continued involvement with patient's care. Joann Gutierrez, BSW, MSW, LCSW  Licensed Education officer, [facility]  [address] [place] [address] [place], [place] [address] Physical Address-[address], [place], Joann Gutierrez [address] Toll Free Main # [phone] Fax # [phone] Cell # [phone]  Fax # [phone]  [email or web address]

## 2015-12-12 ENCOUNTER — Ambulatory Visit: Payer: Medicare Other | Admitting: *Deleted

## 2015-12-12 NOTE — Patient Outreach (Signed)
[facility]) Care Management   12/12/2015  Joann Gutierrez 1945-11-04 [id number]  Joann Gutierrez is an 70 y.o. female recently discharged from inpatient hospital. Joann Gutierrez has history of diabetes, heart failure and COPD.   Subjective:  I am still very weak, not feeling my best but I am glad to be home.    Objective:   ROS  Physical Exam  Encounter Medications:   Outpatient Encounter Prescriptions as of 12/11/2015  Medication Sig  . albuterol (VENTOLIN HFA) 108 (90 BASE) MCG/ACT inhaler Inhale 2 puffs into the lungs every 6 (six) hours as needed for shortness of breath.  Marland Kitchen aspirin EC 81 MG tablet Take 81 mg by mouth daily.  Marland Kitchen atorvastatin (LIPITOR) 10 MG tablet Take 10 mg by mouth daily.  . bacitracin ointment Apply topically 2 (two) times daily. Apply to the rash until resolved  . Calcium Carbonate-Vit D-Min (CALTRATE PLUS PO) Take 600 mg by mouth every morning.   Marland Kitchen Cod Liver Oil CAPS Take 1 capsule by mouth daily.  Marland Kitchen donepezil (ARICEPT) 10 MG tablet Take 10 mg by mouth at bedtime.   Marland Kitchen doxazosin (CARDURA) 2 MG tablet Take 1 tablet by mouth at bedtime.  Marland Kitchen ezetimibe (ZETIA) 10 MG tablet Take 10 mg by mouth at bedtime.   . ferrous sulfate 325 (65 FE) MG EC tablet Take 325 mg by mouth every morning. Reported on 01/25/2015  . fluticasone (FLONASE) 50 MCG/ACT nasal spray Place 2 sprays into both nostrils daily.  . furosemide (LASIX) 80 MG tablet Take 1 tablet (80 mg total) by mouth daily.  Marland Kitchen gabapentin (NEURONTIN) 300 MG capsule Take 1 capsule (300 mg total) by mouth 2 (two) times daily.  . hydrALAZINE (APRESOLINE) 100 MG tablet Take 100 mg by mouth 3 (three) times daily.  . insulin glargine (LANTUS) 100 UNIT/ML injection Inject 0.15 mLs (15 Units total) into the skin daily. (Patient taking differently: Inject 15 Units into the skin at bedtime. )  . insulin NPH (HUMULIN N) 100 UNIT/ML injection Inject 2-13 Units into the skin 3 (three) times daily after  meals. Sliding scale  . isosorbide mononitrate (IMDUR) 120 MG 24 hr tablet Take 1 tablet (120 mg total) by mouth daily. PATIENT NEEDS TO CONTACT OFFICE FOR ADDITIONAL REFILLS  . KLOR-CON M20 20 MEQ tablet Take 1 tablet by mouth daily.   . montelukast (SINGULAIR) 10 MG tablet Take 1 tablet (10 mg total) by mouth daily.  . Multiple Vitamins-Minerals (MULTIVITAMIN GUMMIES ADULT) CHEW Chew 1 capsule by mouth daily.  . nitroGLYCERIN (NITROSTAT) 0.4 MG SL tablet PLACE ONE TABLET UNDER THE TONGUE EVERY FIVE MINUTES AS NEEDED FOR CHEST PAIN  . Omega-3 Fatty Acids (FISH OIL) 1000 MG CAPS Take 1 capsule by mouth daily.  . OXYGEN Inhale into the lungs. CPAP with oxygen at bedtime  . polyvinyl alcohol (LIQUIFILM TEARS) 1.4 % ophthalmic solution Place 1 drop into both eyes as needed for dry eyes.  . raloxifene (EVISTA) 60 MG tablet Take 60 mg by mouth every morning.   . SYMBICORT 160-4.5 MCG/ACT inhaler INHALE 2 PUFFS INTO THE LUNGS 2 TIMES DAILY  . traMADol (ULTRAM) 50 MG tablet Take 1 tablet (50 mg total) by mouth every 8 (eight) hours as needed for moderate pain or severe pain. Reported on 02/07/2015  . valACYclovir (VALTREX) 1000 MG tablet Take 1 tablet (1,000 mg total) by mouth daily. X 3  more days  . Vitamin D, Ergocalciferol, (DRISDOL) 50000 UNITS CAPS capsule Take 50,000 Units by  mouth every Monday.    No facility-administered encounter medications on file as of 12/11/2015.     Functional Status:   In your present state of health, do you have any difficulty performing the following activities: 12/11/2015 12/06/2015  Hearing? N N  Vision? Y Y  Difficulty concentrating or making decisions? Joann Gutierrez  Walking or climbing stairs? Y Y  Dressing or bathing? Y Y  Doing errands, shopping? Joann Gutierrez  Preparing Food and eating ? Y Y  Using the Toilet? N N  In the past six months, have you accidently leaked urine? N N  Do you have problems with loss of bowel control? N N  Managing your Medications? Y Y  Managing  your Finances? Y N  Housekeeping or managing your Housekeeping? Joann Gutierrez  Some recent data might be hidden    Fall/Depression Screening:    PHQ 2/9 Scores 12/11/2015 12/06/2015 08/03/2015 06/05/2015 05/15/2015 04/11/2015 02/02/2015  PHQ - 2 Score 1 0 0 0 0 0 0  PHQ- 9 Score - - - - - - -  Exception Documentation - - - - - - -   THN CM Care Plan Problem One   Flowsheet Row Most Recent Value  Care Plan Problem One  Patient had recent hospitalization for COPD exacerbation  Role Documenting the Problem One  Care Management [place] for Problem One  Active  THN Long Term Goal (31-90 days)  Patient will have no acute care admission for the next 31 days.  THN Long Term Goal Start Date  12/06/15  Interventions for Problem One Long Term Goal  initial home visit  THN CM Short Term Goal #2 (0-30 days)  In the next 21 days, patient and significant other will meet with [place] for heart failure education  [facility] CM Short Term Goal #2 Start Date  12/06/15  Interventions for Short Term Goal #2  Initial home visit for assessment of barriers to prevent [place] transition.     THN CM Care Plan Problem Two   Flowsheet Row Most Recent Value  Care Plan Problem Two  In the next 28 days, patient will weight 21 out of 28 times  Role Documenting the Problem Two  Care Management Coordinator     Assessment:   Patient's caregiver is her long term boyfriend, Mr. Joann Gutierrez.  Patient's son and his girlfriend present and report being assisting with patientcare.  Patient reports having been contacted and visited by the assigned home health agency.  Plan:  Telephone contact within next 2 weeks for community care coordination.

## 2015-12-13 ENCOUNTER — Telehealth: Payer: Self-pay | Admitting: *Deleted

## 2015-12-13 ENCOUNTER — Other Ambulatory Visit: Payer: Self-pay

## 2015-12-13 DIAGNOSIS — I130 Hypertensive heart and chronic kidney disease with heart failure and stage 1 through stage 4 chronic kidney disease, or unspecified chronic kidney disease: Secondary | ICD-10-CM | POA: Diagnosis not present

## 2015-12-13 DIAGNOSIS — J449 Chronic obstructive pulmonary disease, unspecified: Secondary | ICD-10-CM | POA: Diagnosis not present

## 2015-12-13 DIAGNOSIS — I5032 Chronic diastolic (congestive) heart failure: Secondary | ICD-10-CM | POA: Diagnosis not present

## 2015-12-13 DIAGNOSIS — N183 Chronic kidney disease, stage 3 (moderate): Secondary | ICD-10-CM | POA: Diagnosis not present

## 2015-12-13 DIAGNOSIS — D631 Anemia in chronic kidney disease: Secondary | ICD-10-CM | POA: Diagnosis not present

## 2015-12-13 DIAGNOSIS — E1122 Type 2 diabetes mellitus with diabetic chronic kidney disease: Secondary | ICD-10-CM | POA: Diagnosis not present

## 2015-12-13 NOTE — Telephone Encounter (Signed)
Discussed the patient with the doctor and was advised to have her evaluated at the urgent care and to keep her appt here with the doctor. She advises she will take the patient to the urgent care as waiting is not an option.   Dr Megan Salon is aware.

## 2015-12-13 NOTE — Telephone Encounter (Signed)
The patient and her sister called the office to see if they could be seen sooner than 12/21/15. Sister wanda advised that the patient face is swelling again and very painful. She advised last time if was the left side this time it is also the left side but she has a new lesion on the right beside her nose as well. She advised the patient was released from the hospital without any antibiotics on Monday 12/04/15 and she if afraid if they wait until next week for her appt she will end up back in the hospital. She advised her sister is a kidney patient and not sure if they stopped antibiotics because of that but they need something. She asked if the doctor could call in some medication of if they should go to the ED. Advised we are not back in clinic un Monday 12/18/15 due to holiday but will do my best to get back to her either way before we leave today.

## 2015-12-13 NOTE — Patient Outreach (Signed)
   Telephone call received from patient's sister, [name]. [name] reports patient experiencing a new lesion on the right slide of her nostril and area where nasal cannula touches the face. [name] further described the lesion as being crusted over. [name] denies drainage [name] advised to cushion the nasal cannula by wrapping a kleenex at point of where nasal cannula hits skin. [name] also reminded patient is being followed by homme health agency.    [name] is to call patient's primary care physician to be seen or take patient to emergency room if lesion starts to drain or become painful.  Plan: Telephonic follow up next week for community care coordination

## 2015-12-13 NOTE — Telephone Encounter (Signed)
Unfortunately there is not time to work her in this afternoon in the clinic will be closed the next 4 days for the Thanksgiving holiday. She probably needs to go to [facility] urgent care center.

## 2015-12-14 ENCOUNTER — Encounter (HOSPITAL_COMMUNITY): Payer: Self-pay | Admitting: Emergency Medicine

## 2015-12-14 ENCOUNTER — Emergency Department (HOSPITAL_COMMUNITY)
Admission: EM | Admit: 2015-12-14 | Discharge: 2015-12-14 | Disposition: A | Discharge status: Home / Self Care | Payer: Medicare Other | Attending: Emergency Medicine | Admitting: Emergency Medicine

## 2015-12-14 ENCOUNTER — Emergency Department (HOSPITAL_COMMUNITY): Payer: Medicare Other

## 2015-12-14 DIAGNOSIS — R1084 Generalized abdominal pain: Secondary | ICD-10-CM | POA: Diagnosis not present

## 2015-12-14 DIAGNOSIS — Z794 Long term (current) use of insulin: Secondary | ICD-10-CM | POA: Diagnosis not present

## 2015-12-14 DIAGNOSIS — Z79899 Other long term (current) drug therapy: Secondary | ICD-10-CM | POA: Diagnosis not present

## 2015-12-14 DIAGNOSIS — Z87891 Personal history of nicotine dependence: Secondary | ICD-10-CM | POA: Diagnosis not present

## 2015-12-14 DIAGNOSIS — Z8673 Personal history of transient ischemic attack (TIA), and cerebral infarction without residual deficits: Secondary | ICD-10-CM | POA: Diagnosis not present

## 2015-12-14 DIAGNOSIS — N183 Chronic kidney disease, stage 3 (moderate): Secondary | ICD-10-CM | POA: Insufficient documentation

## 2015-12-14 DIAGNOSIS — I2510 Atherosclerotic heart disease of native coronary artery without angina pectoris: Secondary | ICD-10-CM | POA: Insufficient documentation

## 2015-12-14 DIAGNOSIS — I509 Heart failure, unspecified: Secondary | ICD-10-CM | POA: Diagnosis not present

## 2015-12-14 DIAGNOSIS — Z7982 Long term (current) use of aspirin: Secondary | ICD-10-CM | POA: Diagnosis not present

## 2015-12-14 DIAGNOSIS — E1140 Type 2 diabetes mellitus with diabetic neuropathy, unspecified: Secondary | ICD-10-CM | POA: Diagnosis not present

## 2015-12-14 DIAGNOSIS — K5790 Diverticulosis of intestine, part unspecified, without perforation or abscess without bleeding: Secondary | ICD-10-CM | POA: Diagnosis not present

## 2015-12-14 DIAGNOSIS — R112 Nausea with vomiting, unspecified: Secondary | ICD-10-CM | POA: Diagnosis not present

## 2015-12-14 DIAGNOSIS — I130 Hypertensive heart and chronic kidney disease with heart failure and stage 1 through stage 4 chronic kidney disease, or unspecified chronic kidney disease: Secondary | ICD-10-CM | POA: Insufficient documentation

## 2015-12-14 DIAGNOSIS — Z96652 Presence of left artificial knee joint: Secondary | ICD-10-CM | POA: Insufficient documentation

## 2015-12-14 DIAGNOSIS — R4182 Altered mental status, unspecified: Secondary | ICD-10-CM | POA: Diagnosis not present

## 2015-12-14 DIAGNOSIS — R52 Pain, unspecified: Secondary | ICD-10-CM

## 2015-12-14 DIAGNOSIS — J449 Chronic obstructive pulmonary disease, unspecified: Secondary | ICD-10-CM | POA: Diagnosis not present

## 2015-12-14 DIAGNOSIS — R1010 Upper abdominal pain, unspecified: Secondary | ICD-10-CM | POA: Diagnosis not present

## 2015-12-14 LAB — CBC WITH DIFFERENTIAL/PLATELET
BASOS ABS: 0 10*3/uL (ref 0.0–0.1)
Basophils Relative: 0 %
Eosinophils Absolute: 0 10*3/uL (ref 0.0–0.7)
Eosinophils Relative: 0 %
HEMATOCRIT: 36.5 % (ref 36.0–46.0)
HEMOGLOBIN: 11.6 g/dL — AB (ref 12.0–15.0)
LYMPHS ABS: 1.3 10*3/uL (ref 0.7–4.0)
LYMPHS PCT: 16 %
MCH: 23.4 pg — ABNORMAL LOW (ref 26.0–34.0)
MCHC: 31.8 g/dL (ref 30.0–36.0)
MCV: 73.7 fL — AB (ref 78.0–100.0)
Monocytes Absolute: 1 10*3/uL (ref 0.1–1.0)
Monocytes Relative: 13 %
NEUTROS ABS: 5.4 10*3/uL (ref 1.7–7.7)
Neutrophils Relative %: 71 %
Platelets: 381 10*3/uL (ref 150–400)
RBC: 4.95 MIL/uL (ref 3.87–5.11)
RDW: 16.4 % — ABNORMAL HIGH (ref 11.5–15.5)
WBC: 7.7 10*3/uL (ref 4.0–10.5)

## 2015-12-14 LAB — BASIC METABOLIC PANEL
ANION GAP: 11 (ref 5–15)
BUN: 23 mg/dL — ABNORMAL HIGH (ref 6–20)
CHLORIDE: 97 mmol/L — AB (ref 101–111)
CO2: 32 mmol/L (ref 22–32)
Calcium: 9.3 mg/dL (ref 8.9–10.3)
Creatinine, Ser: 1.49 mg/dL — ABNORMAL HIGH (ref 0.44–1.00)
GFR calc Af Amer: 40 mL/min — ABNORMAL LOW (ref >60)
GFR, EST NON AFRICAN AMERICAN: 34 mL/min — AB (ref >60)
Glucose, Bld: 222 mg/dL — ABNORMAL HIGH (ref 65–99)
POTASSIUM: 4.3 mmol/L (ref 3.5–5.1)
SODIUM: 140 mmol/L (ref 135–145)

## 2015-12-14 LAB — CBG MONITORING, ED: GLUCOSE-CAPILLARY: 195 mg/dL — AB (ref 65–99)

## 2015-12-14 LAB — I-STAT CG4 LACTIC ACID, ED: Lactic Acid, Venous: 1.81 mmol/L (ref 0.5–1.9)

## 2015-12-14 MED ORDER — ONDANSETRON 4 MG PO TBDP: 4.0000 mg | ORAL_TABLET | ORAL | 0 refills | Status: DC | PRN

## 2015-12-14 MED ORDER — SODIUM CHLORIDE 0.9 % IJ SOLN
INTRAMUSCULAR | Status: AC
  Filled 2015-12-14: qty 50

## 2015-12-14 MED ORDER — IOPAMIDOL (ISOVUE-300) INJECTION 61%
INTRAVENOUS | Status: AC
  Filled 2015-12-14: qty 100

## 2015-12-14 MED ORDER — ONDANSETRON HCL 4 MG/2ML IJ SOLN
4.0000 mg | Freq: Once | INTRAMUSCULAR | Status: AC
  Administered 2015-12-14: 4 mg via INTRAVENOUS
  Filled 2015-12-14: qty 2

## 2015-12-14 MED ORDER — SODIUM CHLORIDE 0.9 % IV BOLUS (SEPSIS)
500.0000 mL | Freq: Once | INTRAVENOUS | Status: AC
  Administered 2015-12-14: 500 mL via INTRAVENOUS

## 2015-12-14 MED ORDER — IOPAMIDOL (ISOVUE-300) INJECTION 61%
100.0000 mL | Freq: Once | INTRAVENOUS | Status: AC | PRN
  Administered 2015-12-14: 80 mL via INTRAVENOUS

## 2015-12-14 NOTE — ED Provider Notes (Signed)
[facility] DEPT Provider Note   CSN: [id number] Arrival date & time: 12/14/15  1249     History   Chief Complaint Chief Complaint  Patient presents with  . Abdominal Pain    HPI Joann Gutierrez is a 70 y.o. female.  She presents for evaluation of abdominal pain, nausea, vomiting, and left face pain. Symptoms persisted and worsening since hospital discharge 6 days ago, following treatment for left facial herpes zoster. Her partner states that she has not been able to eat, take her medicines or comfortable for the last 3 days. She is also more confused than usual. Patient is usually fairly ambulatory, but lately has only been able to go from bed to bathroom, to urinate. There are no known modifying factors.  HPI  Past Medical History:  Diagnosis Date  . Adenomatous colon polyp   . Allergy   . Anemia   . Asthma       . CAD (coronary artery disease)    Mild very minimal coronary disease with 20% obtuse marginal stenosis  . Carpal tunnel syndrome on left   . CHF (congestive heart failure) ([place])   . Chronic kidney disease (CKD), stage III (moderate)   . COPD (chronic obstructive pulmonary disease) ([place])   . CVA (cerebral infarction)   . Depression   . 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 Diverticulosis   . 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 Esophageal dysmotility   . Esophageal stricture   . Gastritis   . Gastroparesis 08/21/2007  . GERD (gastroesophageal reflux disease)   . Hair loss   . Hearing loss of both ears   . Hernia, hiatal   . Hyperlipidemia   . Hypertension   . Morbid obesity ([place])   . Osteoarthritis   . OSTEOARTHRITIS 08/09/2006  . Osteoporosis   . 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 RENAL INSUFFICIENCY 02/16/2009  . 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 Seizures ([place])    pt thinks it has been several monthes since she had a seisure  . Sickle cell trait ([place])   . Stroke ([place])   . Tubular adenoma of colon      Patient Active Problem List   Diagnosis Date Noted  . Herpes zoster with complication 57/90/3833  . Cellulitis of face 11/27/2015  . Anemia of chronic disease 08/17/2015  . History of colonic polyps   . Hypertensive emergency 05/20/2015  . Hypokalemia 05/20/2015  . AKI (acute kidney injury) ([place])   . Esophageal dysmotility 03/01/2015  . Throat congestion 10/11/2014  . Severe obesity (BMI >= 40) ([place]) 09/23/2014  . Acute asthmatic bronchitis 05/20/2014  . Abdominal pain, other specified site 09/13/2013  . Elevated liver enzymes 09/13/2013  . Cough 05/05/2013  . Pain in lower limb 03/05/2013  . Dyspnea 02/03/2013  . Chronic respiratory failure ([place]) 12/13/2012  . Stricture and stenosis of esophagus 08/31/2012  . Onychomycosis 06/01/2012  . Pain in joint, ankle and foot 06/01/2012  . Insulin dependent diabetes mellitus ([place]) 04/29/2012  . Essential hypertension 07/19/2011  . CKD (chronic kidney disease), stage III 05/19/2011  . Family history of malignant neoplasm of gastrointestinal tract 10/30/2010  . Bloating 10/16/2010  . Epigastric pain 10/16/2010  . Foot pain 09/25/2010  . Dysphagia 07/16/2010  . GERD (gastroesophageal reflux disease) 07/16/2010  . Microcytic anemia 07/16/2010  . Obstructive sleep apnea 06/16/2009  . HYPERCHOLESTEROLEMIA 02/16/2009  . 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 Gastroparesis 08/21/2007  . Chronic obstructive asthma (  [place]) 08/09/2006    Past Surgical History:  Procedure Laterality Date  . ABDOMINAL HYSTERECTOMY    . ARTERY BIOPSY  01/07/2011   Procedure: MINOR BIOPSY TEMPORAL ARTERY;  Surgeon: Haywood Lasso, MD;  Location: [facility];  Service: General;  Laterality: Left;  left temporal artery biopsy  . bil foot surgery    . BREAST LUMPECTOMY     benign  . BREAST LUMPECTOMY     both breast lumps removed   . COLONOSCOPY    . COLONOSCOPY WITH PROPOFOL N/A 07/05/2015   Procedure: COLONOSCOPY WITH PROPOFOL;   Surgeon: Manus Gunning, MD;  Location: [facility];  Service: Gastroenterology;  Laterality: N/A;  . ERD  08/08/2000  . ESOPHAGEAL MANOMETRY N/A 03/13/2015   Procedure: ESOPHAGEAL MANOMETRY (EM);  Surgeon: Manus Gunning, MD;  Location: [facility];  Service: Gastroenterology;  Laterality: N/A;  . ESOPHAGOGASTRODUODENOSCOPY  06/25/2006  . ESOPHAGOGASTRODUODENOSCOPY (EGD) WITH PROPOFOL N/A 07/05/2015   Procedure: ESOPHAGOGASTRODUODENOSCOPY (EGD) WITH PROPOFOL;  Surgeon: Manus Gunning, MD;  Location: [facility];  Service: Gastroenterology;  Laterality: N/A;  . HERNIA REPAIR    . LEFT AND RIGHT HEART CATHETERIZATION WITH CORONARY ANGIOGRAM N/A 03/03/2013   Procedure: LEFT AND RIGHT HEART CATHETERIZATION WITH CORONARY ANGIOGRAM;  Surgeon: Minus Breeding, MD;  Location[facility] C[facility] LAB;  Service: Cardiovascular;  Laterality: N/A;  . REPLACEMENT TOTAL KNEE Left 1998  . UPPER GASTROINTESTINAL ENDOSCOPY      OB History    No data available       Home Medications    Prior to Admission medications   Medication Sig Start Date End Date Taking? Authorizing Provider  albuterol (VENTOLIN HFA) 108 (90 BASE) MCG/ACT inhaler Inhale 2 puffs into the lungs every 6 (six) hours as needed for shortness of breath. 11/10/14   [name], MD  aspirin EC 81 MG tablet Take 81 mg by mouth daily.    Historical Provider, MD  atorvastatin (LIPITOR) 10 MG tablet Take 10 mg by mouth daily.    Historical Provider, MD  bacitracin ointment Apply topically 2 (two) times daily. Apply to the rash until resolved 12/03/15   Ripudeep K Rai, MD  Calcium Carbonate-Vit D-Min (CALTRATE PLUS PO) Take 600 mg by mouth every morning.     Historical Provider, MD  [facility] Liver Oil CAPS Take 1 capsule by mouth daily.    Historical Provider, MD  donepezil (ARICEPT) 10 MG tablet Take 10 mg by mouth at bedtime.     Historical Provider, MD  doxazosin (CARDURA) 2 MG tablet Take 1 tablet by mouth at bedtime. 10/10/14    Historical Provider, MD  ezetimibe (ZETIA) 10 MG tablet Take 10 mg by mouth at bedtime.     Historical Provider, MD  ferrous sulfate 325 (65 FE) MG EC tablet Take 325 mg by mouth every morning. Reported on 01/25/2015    Historical Provider, MD  fluticasone (FLONASE) 50 MCG/ACT nasal spray Place 2 sprays into both nostrils daily. 11/10/14 08/27/17  [name], MD  furosemide (LASIX) 80 MG tablet Take 1 tablet (80 mg total) by mouth daily. 12/04/15   Ripudeep Krystal Eaton, MD  gabapentin (NEURONTIN) 300 MG capsule Take 1 capsule (300 mg total) by mouth 2 (two) times daily. 04/29/11   Renato Shin, MD  hydrALAZINE (APRESOLINE) 100 MG tablet Take 100 mg by mouth 3 (three) times daily.    Historical Provider, MD  insulin glargine (LANTUS) 100 UNIT/ML injection Inject 0.15 mLs (15 Units total) into the skin daily. Patient  taking differently: Inject 15 Units into the skin at bedtime.  09/18/13   Maryann Mikhail, DO  insulin NPH (HUMULIN N) 100 UNIT/ML injection Inject 2-13 Units into the skin 3 (three) times daily after meals. Sliding scale 07/07/12   Renato Shin, MD  isosorbide mononitrate (IMDUR) 120 MG 24 hr tablet Take 1 tablet (120 mg total) by mouth daily. PATIENT NEEDS TO CONTACT OFFICE FOR ADDITIONAL REFILLS 01/11/15   Minus Breeding, MD  KLOR-CON M20 20 MEQ tablet Take 1 tablet by mouth daily.  06/19/11   Historical Provider, MD  montelukast (SINGULAIR) 10 MG tablet Take 1 tablet (10 mg total) by mouth daily. 11/10/14   [name], MD  Multiple Vitamins-Minerals (MULTIVITAMIN GUMMIES ADULT) CHEW Chew 1 capsule by mouth daily.    Historical Provider, MD  nitroGLYCERIN (NITROSTAT) 0.4 MG SL tablet PLACE ONE TABLET UNDER THE TONGUE EVERY FIVE MINUTES AS NEEDED FOR CHEST PAIN 10/04/15   Minus Breeding, MD  Omega-3 Fatty Acids (FISH OIL) 1000 MG CAPS Take 1 capsule by mouth daily.    Historical Provider, MD  omeprazole (PRILOSEC) 20 MG capsule Take 1 capsule (20 mg total) by mouth 2 (two) times daily before a meal.  09/18/15 11/27/15  Manus Gunning, MD  OXYGEN Inhale into the lungs. CPAP with oxygen at bedtime    Historical Provider, MD  polyvinyl alcohol (LIQUIFILM TEARS) 1.4 % ophthalmic solution Place 1 drop into both eyes as needed for dry eyes. 12/04/15   Ripudeep Krystal Eaton, MD  raloxifene (EVISTA) 60 MG tablet Take 60 mg by mouth every morning.     Historical Provider, MD  SYMBICORT 160-4.5 MCG/ACT inhaler INHALE 2 PUFFS INTO THE LUNGS 2 TIMES DAILY 11/23/15   Tammy S Parrett, NP  traMADol (ULTRAM) 50 MG tablet Take 1 tablet (50 mg total) by mouth every 8 (eight) hours as needed for moderate pain or severe pain. Reported on 02/07/2015 12/04/15   Ripudeep Krystal Eaton, MD  valACYclovir (VALTREX) 1000 MG tablet Take 1 tablet (1,000 mg total) by mouth daily. X 3  more days 12/04/15   Ripudeep Krystal Eaton, MD  Vitamin D, Ergocalciferol, (DRISDOL) 50000 UNITS CAPS capsule Take 50,000 Units by mouth every Monday.     Historical Provider, MD    Family History Family History  Problem Relation Age of Onset  . Liver cancer Mother     Liver Cancer  . Diabetes Mother   . Kidney disease Mother   . Heart disease Mother     age 37's  . Heart disease Father 95    MI  . Heart attack Father     died of MI when pt was 66  . Sickle cell anemia Father   . Colon cancer Brother   . Cancer Brother     Colon Cancer  . Diabetes Sister   . Kidney disease Sister   . Heart disease Sister     age 78's  . Allergies Sister   . Diabetes Sister   . Kidney disease Sister   . Heart disease Sister     age 3's  . Esophageal cancer Neg Hx   . Rectal cancer Neg Hx   . Stomach cancer Neg Hx     Social History Social History  Substance Use Topics  . Smoking status: Former Smoker    Packs/day: 0.50    Years: 10.00    Quit date: 04/18/1980  . Smokeless tobacco: Never Used  . Alcohol use No     Allergies  Morphine and related and Promethazine hcl   Review of Systems Review of Systems  All other systems reviewed and are  negative.    Physical Exam Updated Vital Signs BP 187/83 (BP Location: Left Arm)   Pulse 73   Temp 98.7 F (37.1 C) (Oral)   Resp 21   SpO2 95%   Physical Exam  Constitutional: She is oriented to person, place, and time. She appears well-developed.  Elderly, frail  HENT:  Head: Normocephalic and atraumatic.  Eyes: Conjunctivae and EOM are normal. Pupils are equal, round, and reactive to light.  Neck: Normal range of motion and phonation normal. Neck supple.  Cardiovascular: Normal rate and regular rhythm.   Pulmonary/Chest: Effort normal and breath sounds normal. She exhibits no tenderness.  Abdominal: Soft. She exhibits distension. She exhibits no mass. There is tenderness (Diffuse, moderate). There is guarding. There is no rebound.  Musculoskeletal: Normal range of motion.  Neurological: She is alert and oriented to person, place, and time. No cranial nerve deficit. She exhibits normal muscle tone.  No dysarthria or aphasia. She is confused, and is a poor historian.  Skin: Skin is warm and dry.  No rash or swelling of the left side of the face, which was previously notable for a rash consistent with shingles.  Psychiatric: She has a normal mood and affect. Her behavior is normal.  Nursing note and vitals reviewed.    ED Treatments / Results  Labs (all labs ordered are listed, but only abnormal results are displayed) Labs Reviewed  BASIC METABOLIC PANEL - Abnormal; Notable for the following:       Result Value   Chloride 97 (*)    Glucose, Bld 222 (*)    BUN 23 (*)    Creatinine, Ser 1.49 (*)    GFR calc non Af Amer 34 (*)    GFR calc Af Amer 40 (*)    All other components within normal limits  CBC WITH DIFFERENTIAL/PLATELET - Abnormal; Notable for the following:    Hemoglobin 11.6 (*)    MCV 73.7 (*)    MCH 23.4 (*)    RDW 16.4 (*)    All other components within normal limits  CBG MONITORING, ED - Abnormal; Notable for the following:    Glucose-Capillary 195 (*)     All other components within normal limits  CULTURE, BLOOD (ROUTINE X 2)  CULTURE, BLOOD (ROUTINE X 2)  I-STAT CG4 LACTIC ACID, ED    BUN  Date Value Ref Range Status  12/14/2015 23 (H) 6 - 20 mg/dL Final  12/04/2015 71 (H) 6 - 20 mg/dL Final  12/03/2015 65 (H) 6 - 20 mg/dL Final  12/02/2015 68 (H) 6 - 20 mg/dL Final   Creat  Date Value Ref Range Status  07/24/2011 1.36 (H) 0.50 - 1.10 mg/dL Final   Creatinine, Ser  Date Value Ref Range Status  12/14/2015 1.49 (H) 0.44 - 1.00 mg/dL Final  12/04/2015 2.31 (H) 0.44 - 1.00 mg/dL Final  12/03/2015 1.97 (H) 0.44 - 1.00 mg/dL Final    Comment:    DELTA CHECK NOTED  12/02/2015 3.31 (H) 0.44 - 1.00 mg/dL Final      EKG  EKG Interpretation None       Radiology No results found.  Procedures Procedures (including critical care time)  Medications Ordered in ED Medications  sodium chloride 0.9 % bolus 500 mL (500 mLs Intravenous New Bag/Given 12/14/15 1330)  ondansetron (ZOFRAN) injection 4 mg (4 mg Intravenous Given 12/14/15 1329)  Initial Impression / Assessment and Plan / ED Course  I have reviewed the triage vital signs and the nursing notes.  Pertinent labs & imaging results that were available during my care of the patient were reviewed by me and considered in my medical decision making (see chart for details).  Clinical Course as of Dec 13 1516  Thu Dec 14, 2015  1405 Normal Lactic Acid, Venous: 1.81 [EW]  1405 Slightly low Hemoglobin: (!) 11.6 [EW]  1437 low Chloride: (!) 97 [EW]  1438 High  Glucose: (!) 222 [EW]  1438 High  Creatinine: (!) 1.49 [EW]  1438 low EGFR (Non-African Amer.): (!) 34 [EW]  1503 She has tolerated the oral contrast, without vomiting. She has been able to drink some Orange juice, tolerating well. Vital signs repeated are reassuring. Patient looks more comfortable and states she feels better. Family members are with her now, and they are updated on the findings and likely plans  for discharge later after CT imaging.  [EW]    Clinical Course User Index [EW] Daleen Bo, MD     Medications  sodium chloride 0.9 % bolus 500 mL (500 mLs Intravenous New Bag/Given 12/14/15 1330)  ondansetron (ZOFRAN) injection 4 mg (4 mg Intravenous Given 12/14/15 1329)    Patient Vitals for the past 24 hrs:  BP Temp Temp src Pulse Resp SpO2  12/14/15 1500 187/83 98.7 F (37.1 C) Oral 73 21 95 %  12/14/15 1444 187/63 - - 78 16 95 %  12/14/15 1312 (!) 205/100 98.5 F (36.9 C) Oral 77 18 95 %    2:57 PM Reevaluation with update and discussion. After initial assessment and treatment, an updated evaluation reveals She is taller in oral liquids now and feels much better. Findings discussed with patient and family members, all questions answered. , L     Final Clinical Impressions(s) / ED Diagnoses   Final diagnoses:  Nausea and vomiting, intractability of vomiting not specified, unspecified vomiting type  Generalized abdominal pain     Nonspecific abdominal pain with nausea and vomiting. Possibility of reaction to Valtrex, which she was taking for shingles. Facial rash appears completely resolved at this time. She does not appear to require any additional Valtrex currently. Creatinine is actually improved from 10 days ago, which is reassuring. Doubt serous bacterial infection. Metabolic instability or impending vascular collapse.    Nonspecific symptoms, while being treated for shingles. Possible intolerance of oral antiviral medication. His source for nausea, vomiting and abdominal pain. Renal function is improving, from recent, 10 days ago.  Plan: Dr. Johnney Killian to evaluate after CT imaging  New Prescriptions New Prescriptions   No medications on file     Daleen Bo, MD 12/15/15 [phone]

## 2015-12-14 NOTE — ED Notes (Signed)
Bed: PO24 Expected date:  Expected time:  Means of arrival:  Comments: EMS- elderly, abdominal pain/n/v/d

## 2015-12-14 NOTE — ED Triage Notes (Signed)
Patient here from home with complaints of epigastric abdominal pain, reports that this is a familiar pain from hiatal hernia. States that she was discharged from [facility] with shingles and had this pain there. Nausea/vomiting.

## 2015-12-14 NOTE — ED Notes (Signed)
RN to get second set of blood cultures

## 2015-12-15 ENCOUNTER — Ambulatory Visit: Payer: Medicare Other

## 2015-12-15 ENCOUNTER — Inpatient Hospital Stay (HOSPITAL_COMMUNITY): Admission: RE | Admit: 2015-12-15 | Payer: Medicare Other | Source: Ambulatory Visit

## 2015-12-18 DIAGNOSIS — J449 Chronic obstructive pulmonary disease, unspecified: Secondary | ICD-10-CM | POA: Diagnosis not present

## 2015-12-18 DIAGNOSIS — I5032 Chronic diastolic (congestive) heart failure: Secondary | ICD-10-CM | POA: Diagnosis not present

## 2015-12-18 DIAGNOSIS — N183 Chronic kidney disease, stage 3 (moderate): Secondary | ICD-10-CM | POA: Diagnosis not present

## 2015-12-18 DIAGNOSIS — D631 Anemia in chronic kidney disease: Secondary | ICD-10-CM | POA: Diagnosis not present

## 2015-12-18 DIAGNOSIS — E1122 Type 2 diabetes mellitus with diabetic chronic kidney disease: Secondary | ICD-10-CM | POA: Diagnosis not present

## 2015-12-18 DIAGNOSIS — I130 Hypertensive heart and chronic kidney disease with heart failure and stage 1 through stage 4 chronic kidney disease, or unspecified chronic kidney disease: Secondary | ICD-10-CM | POA: Diagnosis not present

## 2015-12-19 DIAGNOSIS — I5032 Chronic diastolic (congestive) heart failure: Secondary | ICD-10-CM | POA: Diagnosis not present

## 2015-12-19 DIAGNOSIS — E1122 Type 2 diabetes mellitus with diabetic chronic kidney disease: Secondary | ICD-10-CM | POA: Diagnosis not present

## 2015-12-19 DIAGNOSIS — I130 Hypertensive heart and chronic kidney disease with heart failure and stage 1 through stage 4 chronic kidney disease, or unspecified chronic kidney disease: Secondary | ICD-10-CM | POA: Diagnosis not present

## 2015-12-19 DIAGNOSIS — N183 Chronic kidney disease, stage 3 (moderate): Secondary | ICD-10-CM | POA: Diagnosis not present

## 2015-12-19 DIAGNOSIS — J449 Chronic obstructive pulmonary disease, unspecified: Secondary | ICD-10-CM | POA: Diagnosis not present

## 2015-12-19 DIAGNOSIS — D631 Anemia in chronic kidney disease: Secondary | ICD-10-CM | POA: Diagnosis not present

## 2015-12-19 LAB — CULTURE, BLOOD (ROUTINE X 2)
CULTURE: NO GROWTH
Culture: NO GROWTH

## 2015-12-21 ENCOUNTER — Ambulatory Visit (INDEPENDENT_AMBULATORY_CARE_PROVIDER_SITE_OTHER): Payer: Medicare Other | Admitting: Internal Medicine

## 2015-12-21 ENCOUNTER — Encounter: Payer: Self-pay | Admitting: Internal Medicine

## 2015-12-21 VITALS — BP 169/80 | Temp 99.0°F | Ht 64.0 in | Wt 266.0 lb

## 2015-12-21 DIAGNOSIS — I130 Hypertensive heart and chronic kidney disease with heart failure and stage 1 through stage 4 chronic kidney disease, or unspecified chronic kidney disease: Secondary | ICD-10-CM | POA: Diagnosis not present

## 2015-12-21 DIAGNOSIS — B028 Zoster with other complications: Secondary | ICD-10-CM | POA: Diagnosis not present

## 2015-12-21 DIAGNOSIS — E1122 Type 2 diabetes mellitus with diabetic chronic kidney disease: Secondary | ICD-10-CM | POA: Diagnosis not present

## 2015-12-21 DIAGNOSIS — D631 Anemia in chronic kidney disease: Secondary | ICD-10-CM | POA: Diagnosis not present

## 2015-12-21 DIAGNOSIS — J449 Chronic obstructive pulmonary disease, unspecified: Secondary | ICD-10-CM | POA: Diagnosis not present

## 2015-12-21 DIAGNOSIS — I5032 Chronic diastolic (congestive) heart failure: Secondary | ICD-10-CM | POA: Diagnosis not present

## 2015-12-21 DIAGNOSIS — B0229 Other postherpetic nervous system involvement: Secondary | ICD-10-CM | POA: Insufficient documentation

## 2015-12-21 DIAGNOSIS — N183 Chronic kidney disease, stage 3 (moderate): Secondary | ICD-10-CM | POA: Diagnosis not present

## 2015-12-21 MED ORDER — GABAPENTIN 300 MG PO CAPS: 300.0000 mg | ORAL_CAPSULE | Freq: Two times a day (BID) | ORAL | 5 refills | Status: DC

## 2015-12-21 NOTE — Progress Notes (Signed)
[facility]ious Disease  Patient Active Problem List   Diagnosis Date Noted  . Postherpetic neuralgia 12/21/2015    Priority: High  . Herpes zoster with complication 70/26/3785    Priority: High  . Cellulitis of face 11/27/2015  . Anemia of chronic disease 08/17/2015  . History of colonic polyps   . Hypertensive emergency 05/20/2015  . Hypokalemia 05/20/2015  . AKI (acute kidney injury) ([place])   . Esophageal dysmotility 03/01/2015  . Throat congestion 10/11/2014  . Severe obesity (BMI >= 40) ([place]) 09/23/2014  . Acute asthmatic bronchitis 05/20/2014  . Abdominal pain, other specified site 09/13/2013  . Elevated liver enzymes 09/13/2013  . Cough 05/05/2013  . Pain in lower limb 03/05/2013  . Dyspnea 02/03/2013  . Chronic respiratory failure ([place]) 12/13/2012  . Stricture and stenosis of esophagus 08/31/2012  . Onychomycosis 06/01/2012  . Pain in joint, ankle and foot 06/01/2012  . Insulin dependent diabetes mellitus ([place]) 04/29/2012  . Essential hypertension 07/19/2011  . CKD (chronic kidney disease), stage III 05/19/2011  . Family history of malignant neoplasm of gastrointestinal tract 10/30/2010  . Bloating 10/16/2010  . Epigastric pain 10/16/2010  . Foot pain 09/25/2010  . Dysphagia 07/16/2010  . GERD (gastroesophageal reflux disease) 07/16/2010  . Microcytic anemia 07/16/2010  . Obstructive sleep apnea 06/16/2009  . HYPERCHOLESTEROLEMIA 02/16/2009  . 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 Gastroparesis 08/21/2007  . Chronic obstructive asthma ([place]) 08/09/2006    Patient's Medications  New Prescriptions   No medications on file  Previous Medications   ALBUTEROL (VENTOLIN HFA) 108 (90 BASE) MCG/ACT INHALER    Inhale 2 puffs into the lungs every 6 (six) hours as needed for shortness of breath.   ASPIRIN EC 81 MG TABLET    Take 81 mg by mouth daily.   ATORVASTATIN (LIPITOR) 40 MG TABLET    Take 40 mg by mouth daily.   BACITRACIN OINTMENT     Apply topically 2 (two) times daily. Apply to the rash until resolved   CALCIUM CARBONATE-VIT D-MIN (CALTRATE PLUS PO)    Take 600 mg by mouth every morning.    DONEPEZIL (ARICEPT) 10 MG TABLET    Take 10 mg by mouth at bedtime.    DOXAZOSIN (CARDURA) 2 MG TABLET    Take 4 mg by mouth at bedtime.    EPOETIN ALFA (PROCRIT IJ)    Inject 1 each as directed once a week.   EZETIMIBE (ZETIA) 10 MG TABLET    Take 10 mg by mouth at bedtime.    FLUTICASONE (FLONASE) 50 MCG/ACT NASAL SPRAY    Place 2 sprays into both nostrils daily.   FUROSEMIDE (LASIX) 80 MG TABLET    Take 1 tablet (80 mg total) by mouth daily.   HYDRALAZINE (APRESOLINE) 100 MG TABLET    Take 100 mg by mouth 3 (three) times daily.   INSULIN GLARGINE (LANTUS) 100 UNIT/ML INJECTION    Inject 0.15 mLs (15 Units total) into the skin daily.   INSULIN NPH (HUMULIN N) 100 UNIT/ML INJECTION    Inject 2-13 Units into the skin 3 (three) times daily after meals. Sliding scale   ISOSORBIDE MONONITRATE (IMDUR) 120 MG 24 HR TABLET    Take 1 tablet (120 mg total) by mouth daily. PATIENT NEEDS TO CONTACT OFFICE FOR ADDITIONAL REFILLS   KLOR-CON M20 20 MEQ TABLET    Take 20 mEq by mouth daily.    MOMETASONE-FORMOTEROL (DULERA) 100-5 MCG/ACT AERO  Inhale 2 puffs into the lungs 2 (two) times daily.   MONTELUKAST (SINGULAIR) 10 MG TABLET    Take 1 tablet (10 mg total) by mouth daily.   MULTIPLE VITAMINS-MINERALS (MULTIVITAMIN GUMMIES ADULT) CHEW    Chew 1 capsule by mouth daily.   NITROGLYCERIN (NITROSTAT) 0.4 MG SL TABLET    PLACE ONE TABLET UNDER THE TONGUE EVERY FIVE MINUTES AS NEEDED FOR CHEST PAIN   OMEPRAZOLE (PRILOSEC) 20 MG CAPSULE    Take 1 capsule (20 mg total) by mouth 2 (two) times daily before a meal.   ONDANSETRON (ZOFRAN ODT) 4 MG DISINTEGRATING TABLET    Take 1 tablet (4 mg total) by mouth every 4 (four) hours as needed for nausea or vomiting.   OXYGEN    Inhale into the lungs. CPAP with oxygen at bedtime   POLYVINYL ALCOHOL (LIQUIFILM  TEARS) 1.4 % OPHTHALMIC SOLUTION    Place 1 drop into both eyes as needed for dry eyes.   RALOXIFENE (EVISTA) 60 MG TABLET    Take 60 mg by mouth every morning.    SYMBICORT 160-4.5 MCG/ACT INHALER    INHALE 2 PUFFS INTO THE LUNGS 2 TIMES DAILY   TRAMADOL (ULTRAM) 50 MG TABLET    Take 1 tablet (50 mg total) by mouth every 8 (eight) hours as needed for moderate pain or severe pain. Reported on 02/07/2015   VITAMIN D, ERGOCALCIFEROL, (DRISDOL) 50000 UNITS CAPS CAPSULE    Take 50,000 Units by mouth every Monday.   Modified Medications   Modified Medication Previous Medication   GABAPENTIN (NEURONTIN) 300 MG CAPSULE gabapentin (NEURONTIN) 300 MG capsule      Take 1 capsule (300 mg total) by mouth 2 (two) times daily.    Take 1 capsule (300 mg total) by mouth 2 (two) times daily.  Discontinued Medications   VALACYCLOVIR (VALTREX) 1000 MG TABLET    Take 1 tablet (1,000 mg total) by mouth daily. X 3  more days    Subjective: Joann Gutierrez is in for a hospital follow-up visit. She developed left trigeminal zoster about 4 weeks ago and was admitted to the hospital with pain, confusion and evidence of secondary bacterial infection. She had no eye involvement. There was no evidence of encephalitis although she did have some confusion was probably related to pain and sedating medications. She was treated with acyclovir and transition to oral valacyclovir completing 10 days of therapy on 12/06/2015. She was also treated for secondary bacterial infection while in the hospital. Since discharge he has continued to have left sided pain from her ear to her chin. She describes it as sharp and stabbing. She has difficulty telling me how often it occurs. When it does occur it is very severe. She cannot assign a number on a scale of 1-10 for me. She states that she takes tramadol when she has the pain. If the tramadol doesn't work she will then take gabapentin. She has been prescribed gabapentin for diabetic neuropathy and  is supposed to be taking 300 mg twice daily. She cannot tell me why she does not take it on a regular basis. Her fianc is with her and states that she will sometimes refused to take her medicines.  Review of Systems: Review of Systems  Constitutional: Positive for malaise/fatigue. Negative for chills, diaphoresis, fever and weight loss.  HENT: Negative for hearing loss and sore throat.   Eyes: Negative for pain and redness.  Skin: Negative for rash.  Neurological: Positive for sensory change. Negative for headaches.  Past Medical History:  Diagnosis Date  . Adenomatous colon polyp   . Allergy   . Anemia   . Asthma       . CAD (coronary artery disease)    Mild very minimal coronary disease with 20% obtuse marginal stenosis  . Carpal tunnel syndrome on left   . CHF (congestive heart failure) ([place])   . Chronic kidney disease (CKD), stage III (moderate)   . COPD (chronic obstructive pulmonary disease) ([place])   . CVA (cerebral infarction)   . Depression   . 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 Diverticulosis   . 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 Esophageal dysmotility   . Esophageal stricture   . Gastritis   . Gastroparesis 08/21/2007  . GERD (gastroesophageal reflux disease)   . Hair loss   . Hearing loss of both ears   . Hernia, hiatal   . Hyperlipidemia   . Hypertension   . Morbid obesity ([place])   . Osteoarthritis   . OSTEOARTHRITIS 08/09/2006  . Osteoporosis   . 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 RENAL INSUFFICIENCY 02/16/2009  . 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 Seizures ([place])    pt thinks it has been several monthes since she had a seisure  . Sickle cell trait ([place])   . Stroke ([place])   . Tubular adenoma of colon     Social History  Substance Use Topics  . Smoking status: Former Smoker    Packs/day: 0.50    Years: 10.00    Quit date: 04/18/1980  . Smokeless tobacco: Never Used  . Alcohol use No    Family History  Problem  Relation Age of Onset  . Liver cancer Mother     Liver Cancer  . Diabetes Mother   . Kidney disease Mother   . Heart disease Mother     age 64's  . Heart disease Father 60    MI  . Heart attack Father     died of MI when pt was 37  . Sickle cell anemia Father   . Colon cancer Brother   . Cancer Brother     Colon Cancer  . Diabetes Sister   . Kidney disease Sister   . Heart disease Sister     age 44's  . Allergies Sister   . Diabetes Sister   . Kidney disease Sister   . Heart disease Sister     age 41's  . Esophageal cancer Neg Hx   . Rectal cancer Neg Hx   . Stomach cancer Neg Hx     Allergies  Allergen Reactions  . Morphine And Related Other (See Comments)    Family request not to be given, reports pt does not wake up when given   . Promethazine Hcl Other (See Comments)    REACTION: lethargy    Objective: Vitals:   12/21/15 1548  BP: (!) 169/80  Temp: 99 F (37.2 C)  TempSrc: Oral  Weight: 266 lb (120.7 kg)  Height: 5\' 4"  (1.626 m)   Body mass index is 45.66 kg/m.  Physical Exam  Constitutional:  She is in no distress. She is seated in a wheelchair.  HENT:  Mouth/Throat: No oropharyngeal exudate.  Eyes: Conjunctivae are normal.  Neurological: She is alert.  Skin: No rash noted.  She has no rash. Her zoster has resolved and there is no evidence of bacterial infection.  Psychiatric: Mood and affect normal.    Lab Results  Problem List Items Addressed This Visit      High   Herpes zoster with complication - Primary   Postherpetic neuralgia   Relevant Medications   gabapentin (NEURONTIN) 300 MG capsule       Michel Bickers, MD [facility] for [place] [phone] pager   [phone] cell 12/21/2015, 4:08 PM

## 2015-12-21 NOTE — Assessment & Plan Note (Signed)
She has postherpetic neuralgia complicating her recent zoster infection. I talked to her and her fianc about management options. I encouraged her to try taking her gabapentin on a regular basis and using tramadol for breakthrough pain. She can follow-up here on an as-needed basis.

## 2015-12-22 ENCOUNTER — Encounter (HOSPITAL_COMMUNITY)
Admission: RE | Admit: 2015-12-22 | Discharge: 2015-12-22 | Disposition: A | Discharge status: Home / Self Care | Payer: Medicare Other | Source: Ambulatory Visit | Attending: Nephrology | Admitting: Nephrology

## 2015-12-22 ENCOUNTER — Other Ambulatory Visit: Payer: Self-pay

## 2015-12-22 DIAGNOSIS — N183 Chronic kidney disease, stage 3 unspecified: Secondary | ICD-10-CM

## 2015-12-22 DIAGNOSIS — E1122 Type 2 diabetes mellitus with diabetic chronic kidney disease: Secondary | ICD-10-CM | POA: Diagnosis not present

## 2015-12-22 DIAGNOSIS — I130 Hypertensive heart and chronic kidney disease with heart failure and stage 1 through stage 4 chronic kidney disease, or unspecified chronic kidney disease: Secondary | ICD-10-CM | POA: Diagnosis not present

## 2015-12-22 DIAGNOSIS — D631 Anemia in chronic kidney disease: Secondary | ICD-10-CM | POA: Diagnosis not present

## 2015-12-22 DIAGNOSIS — I5032 Chronic diastolic (congestive) heart failure: Secondary | ICD-10-CM | POA: Diagnosis not present

## 2015-12-22 DIAGNOSIS — J449 Chronic obstructive pulmonary disease, unspecified: Secondary | ICD-10-CM | POA: Diagnosis not present

## 2015-12-22 LAB — FERRITIN: Ferritin: 495 ng/mL — ABNORMAL HIGH (ref 11–307)

## 2015-12-22 LAB — IRON AND TIBC
IRON: 87 ug/dL (ref 28–170)
Saturation Ratios: 43 % — ABNORMAL HIGH (ref 10.4–31.8)
TIBC: 202 ug/dL — AB (ref 250–450)
UIBC: 115 ug/dL

## 2015-12-22 LAB — RENAL FUNCTION PANEL
Albumin: 2.9 g/dL — ABNORMAL LOW (ref 3.5–5.0)
Anion gap: 7 (ref 5–15)
BUN: 29 mg/dL — ABNORMAL HIGH (ref 6–20)
CO2: 30 mmol/L (ref 22–32)
Calcium: 8.9 mg/dL (ref 8.9–10.3)
Chloride: 103 mmol/L (ref 101–111)
Creatinine, Ser: 1.59 mg/dL — ABNORMAL HIGH (ref 0.44–1.00)
GFR calc Af Amer: 37 mL/min — ABNORMAL LOW
GFR calc non Af Amer: 32 mL/min — ABNORMAL LOW
Glucose, Bld: 154 mg/dL — ABNORMAL HIGH (ref 65–99)
Phosphorus: 3.2 mg/dL (ref 2.5–4.6)
Potassium: 4.3 mmol/L (ref 3.5–5.1)
Sodium: 140 mmol/L (ref 135–145)

## 2015-12-22 LAB — POCT HEMOGLOBIN-HEMACUE: Hemoglobin: 9.9 g/dL — ABNORMAL LOW (ref 12.0–15.0)

## 2015-12-22 MED ORDER — EPOETIN ALFA 20000 UNIT/ML IJ SOLN
INTRAMUSCULAR | Status: AC
  Filled 2015-12-22: qty 1

## 2015-12-22 MED ORDER — EPOETIN ALFA 20000 UNIT/ML IJ SOLN
20000.0000 [IU] | INTRAMUSCULAR | Status: DC
  Administered 2015-12-22: 20000 [IU] via SUBCUTANEOUS

## 2015-12-23 DIAGNOSIS — I130 Hypertensive heart and chronic kidney disease with heart failure and stage 1 through stage 4 chronic kidney disease, or unspecified chronic kidney disease: Secondary | ICD-10-CM | POA: Diagnosis not present

## 2015-12-23 DIAGNOSIS — D631 Anemia in chronic kidney disease: Secondary | ICD-10-CM | POA: Diagnosis not present

## 2015-12-23 DIAGNOSIS — I5032 Chronic diastolic (congestive) heart failure: Secondary | ICD-10-CM | POA: Diagnosis not present

## 2015-12-23 DIAGNOSIS — J449 Chronic obstructive pulmonary disease, unspecified: Secondary | ICD-10-CM | POA: Diagnosis not present

## 2015-12-23 DIAGNOSIS — E1122 Type 2 diabetes mellitus with diabetic chronic kidney disease: Secondary | ICD-10-CM | POA: Diagnosis not present

## 2015-12-23 DIAGNOSIS — N183 Chronic kidney disease, stage 3 (moderate): Secondary | ICD-10-CM | POA: Diagnosis not present

## 2015-12-23 LAB — PTH, INTACT AND CALCIUM
CALCIUM TOTAL (PTH): 8.6 mg/dL — AB (ref 8.7–10.3)
PTH: 89 pg/mL — AB (ref 15–65)

## 2015-12-23 NOTE — Patient Outreach (Signed)
[facility]) Care Management  12/23/2015   Joann Gutierrez July 13, 1945 [id number]  Subjective:  I feel better. I have been weighing everyday. I am taking my medications as the doctor told me.    Objective:  Telephonic encounter  Current Medications:  Current Outpatient Prescriptions  Medication Sig Dispense Refill  . albuterol (VENTOLIN HFA) 108 (90 BASE) MCG/ACT inhaler Inhale 2 puffs into the lungs every 6 (six) hours as needed for shortness of breath. 1 Inhaler 11  . aspirin EC 81 MG tablet Take 81 mg by mouth daily.    Marland Kitchen atorvastatin (LIPITOR) 40 MG tablet Take 40 mg by mouth daily.    . bacitracin ointment Apply topically 2 (two) times daily. Apply to the rash until resolved 120 g 2  . Calcium Carbonate-Vit D-Min (CALTRATE PLUS PO) Take 600 mg by mouth every morning.     . donepezil (ARICEPT) 10 MG tablet Take 10 mg by mouth at bedtime.     Marland Kitchen doxazosin (CARDURA) 2 MG tablet Take 4 mg by mouth at bedtime.     Marland Kitchen Epoetin Alfa (PROCRIT IJ) Inject 1 each as directed once a week.    . ezetimibe (ZETIA) 10 MG tablet Take 10 mg by mouth at bedtime.     . fluticasone (FLONASE) 50 MCG/ACT nasal spray Place 2 sprays into both nostrils daily. 16 g 11  . furosemide (LASIX) 80 MG tablet Take 1 tablet (80 mg total) by mouth daily.    Marland Kitchen gabapentin (NEURONTIN) 300 MG capsule Take 1 capsule (300 mg total) by mouth 2 (two) times daily. 60 capsule 5  . hydrALAZINE (APRESOLINE) 100 MG tablet Take 100 mg by mouth 3 (three) times daily.    . insulin glargine (LANTUS) 100 UNIT/ML injection Inject 0.15 mLs (15 Units total) into the skin daily. (Patient taking differently: Inject 15 Units into the skin at bedtime. ) 10 mL 11  . insulin NPH (HUMULIN N) 100 UNIT/ML injection Inject 2-13 Units into the skin 3 (three) times daily after meals. Sliding scale    . isosorbide mononitrate (IMDUR) 120 MG 24 hr tablet Take 1 tablet (120 mg total) by mouth daily. PATIENT NEEDS TO CONTACT OFFICE  FOR ADDITIONAL REFILLS 15 tablet 0  . KLOR-CON M20 20 MEQ tablet Take 20 mEq by mouth daily.     . mometasone-formoterol (DULERA) 100-5 MCG/ACT AERO Inhale 2 puffs into the lungs 2 (two) times daily.    . montelukast (SINGULAIR) 10 MG tablet Take 1 tablet (10 mg total) by mouth daily. 30 tablet 11  . Multiple Vitamins-Minerals (MULTIVITAMIN GUMMIES ADULT) CHEW Chew 1 capsule by mouth daily.    . nitroGLYCERIN (NITROSTAT) 0.4 MG SL tablet PLACE ONE TABLET UNDER THE TONGUE EVERY FIVE MINUTES AS NEEDED FOR CHEST PAIN 25 tablet 1  . omeprazole (PRILOSEC) 20 MG capsule Take 1 capsule (20 mg total) by mouth 2 (two) times daily before a meal. 28 capsule 0  . ondansetron (ZOFRAN ODT) 4 MG disintegrating tablet Take 1 tablet (4 mg total) by mouth every 4 (four) hours as needed for nausea or vomiting. 20 tablet 0  . OXYGEN Inhale into the lungs. CPAP with oxygen at bedtime    . polyvinyl alcohol (LIQUIFILM TEARS) 1.4 % ophthalmic solution Place 1 drop into both eyes as needed for dry eyes. 15 mL 0  . raloxifene (EVISTA) 60 MG tablet Take 60 mg by mouth every morning.     . SYMBICORT 160-4.5 MCG/ACT inhaler INHALE 2 PUFFS INTO THE LUNGS  2 TIMES DAILY 10.2 g 4  . traMADol (ULTRAM) 50 MG tablet Take 1 tablet (50 mg total) by mouth every 8 (eight) hours as needed for moderate pain or severe pain. Reported on 02/07/2015 30 tablet 0  . Vitamin D, Ergocalciferol, (DRISDOL) 50000 UNITS CAPS capsule Take 50,000 Units by mouth every Monday.      No current facility-administered medications for this visit.     Functional Status:  In your present state of health, do you have any difficulty performing the following activities: 12/11/2015 12/06/2015  Hearing? N N  Vision? Y Y  Difficulty concentrating or making decisions? Joann Gutierrez  Walking or climbing stairs? Y Y  Dressing or bathing? Y Y  Doing errands, shopping? Joann Gutierrez  Preparing Food and eating ? Y Y  Using the Toilet? N N  In the past six months, have you accidently  leaked urine? N N  Do you have problems with loss of bowel control? N N  Managing your Medications? Y Y  Managing your Finances? Y N  Housekeeping or managing your Housekeeping? Joann Gutierrez  Some recent data might be hidden    Fall/Depression Screening: PHQ 2/9 Scores 12/21/2015 12/11/2015 12/06/2015 08/03/2015 06/05/2015 05/15/2015 04/11/2015  PHQ - 2 Score 0 1 0 0 0 0 0  PHQ- 9 Score - - - - - - -  Exception Documentation - - - - - - -    THN CM Care Plan Problem One   Flowsheet Row Most Recent Value  Care Plan Problem One  Patient had recent hospitalization for COPD exacerbation  Role Documenting the Problem One  Care Management [place] for Problem One  Active  THN Long Term Goal (31-90 days)  Patient will have no acute care admission for the next 31 days.  THN Long Term Goal Start Date  12/06/15  Interventions for Problem One Long Term Goal  telephone follow up.  Patient states she is feeling much better.  Patient also reports she is compliant with medication regimen  THN CM Short Term Goal #2 (0-30 days)  In the next 21 days, patient and significant other will meet with [facility] for heart failure education  [facility] CM Short Term Goal #2 Start Date  12/06/15  Interventions for Short Term Goal #2  telephone follow up fo assess patient's compliance with daily weights.  Patient reports compliance with daily weights.      THN CM Care Plan Problem Two   Flowsheet Row Most Recent Value  Care Plan Problem Two  Patient is not consistently weighing  Role Documenting the Problem Two  Care Management Coordinator  Care Plan for Problem Two  Active  THN CM Short Term Goal #1 (0-30 days)  In the next 28 days, patient will weight 21 out of 28 times and record.    THN CM Short Term Goal #1 Start Date  12/06/15  Interventions for Short Term Goal #2   telephone call made to patient to assess her compliance with daily weights.  Patient and caregiver reports compliance.        Assessment:   Patient need re-education and encouragement in controlling her heart failure. Patient is very sedentary and states she is not ready to exercise yet.  Plan:  Home visit later this month for heart failure education.

## 2015-12-26 DIAGNOSIS — I5032 Chronic diastolic (congestive) heart failure: Secondary | ICD-10-CM | POA: Diagnosis not present

## 2015-12-26 DIAGNOSIS — E1122 Type 2 diabetes mellitus with diabetic chronic kidney disease: Secondary | ICD-10-CM | POA: Diagnosis not present

## 2015-12-26 DIAGNOSIS — I130 Hypertensive heart and chronic kidney disease with heart failure and stage 1 through stage 4 chronic kidney disease, or unspecified chronic kidney disease: Secondary | ICD-10-CM | POA: Diagnosis not present

## 2015-12-26 DIAGNOSIS — N183 Chronic kidney disease, stage 3 (moderate): Secondary | ICD-10-CM | POA: Diagnosis not present

## 2015-12-26 DIAGNOSIS — J449 Chronic obstructive pulmonary disease, unspecified: Secondary | ICD-10-CM | POA: Diagnosis not present

## 2015-12-26 DIAGNOSIS — D631 Anemia in chronic kidney disease: Secondary | ICD-10-CM | POA: Diagnosis not present

## 2015-12-27 DIAGNOSIS — E1122 Type 2 diabetes mellitus with diabetic chronic kidney disease: Secondary | ICD-10-CM | POA: Diagnosis not present

## 2015-12-27 DIAGNOSIS — I5032 Chronic diastolic (congestive) heart failure: Secondary | ICD-10-CM | POA: Diagnosis not present

## 2015-12-27 DIAGNOSIS — J449 Chronic obstructive pulmonary disease, unspecified: Secondary | ICD-10-CM | POA: Diagnosis not present

## 2015-12-27 DIAGNOSIS — D631 Anemia in chronic kidney disease: Secondary | ICD-10-CM | POA: Diagnosis not present

## 2015-12-27 DIAGNOSIS — I130 Hypertensive heart and chronic kidney disease with heart failure and stage 1 through stage 4 chronic kidney disease, or unspecified chronic kidney disease: Secondary | ICD-10-CM | POA: Diagnosis not present

## 2015-12-27 DIAGNOSIS — N183 Chronic kidney disease, stage 3 (moderate): Secondary | ICD-10-CM | POA: Diagnosis not present

## 2015-12-28 ENCOUNTER — Other Ambulatory Visit: Payer: Self-pay | Admitting: Pulmonary Disease

## 2015-12-28 ENCOUNTER — Other Ambulatory Visit: Payer: Self-pay | Admitting: Cardiology

## 2015-12-28 ENCOUNTER — Other Ambulatory Visit (HOSPITAL_COMMUNITY): Payer: Self-pay | Admitting: *Deleted

## 2015-12-28 DIAGNOSIS — D631 Anemia in chronic kidney disease: Secondary | ICD-10-CM | POA: Diagnosis not present

## 2015-12-28 DIAGNOSIS — J449 Chronic obstructive pulmonary disease, unspecified: Secondary | ICD-10-CM | POA: Diagnosis not present

## 2015-12-28 DIAGNOSIS — E1122 Type 2 diabetes mellitus with diabetic chronic kidney disease: Secondary | ICD-10-CM | POA: Diagnosis not present

## 2015-12-28 DIAGNOSIS — I130 Hypertensive heart and chronic kidney disease with heart failure and stage 1 through stage 4 chronic kidney disease, or unspecified chronic kidney disease: Secondary | ICD-10-CM | POA: Diagnosis not present

## 2015-12-28 DIAGNOSIS — I5032 Chronic diastolic (congestive) heart failure: Secondary | ICD-10-CM | POA: Diagnosis not present

## 2015-12-28 DIAGNOSIS — N183 Chronic kidney disease, stage 3 (moderate): Secondary | ICD-10-CM | POA: Diagnosis not present

## 2015-12-29 ENCOUNTER — Encounter (HOSPITAL_COMMUNITY): Payer: Medicare Other

## 2015-12-29 ENCOUNTER — Encounter (HOSPITAL_COMMUNITY): Payer: Self-pay

## 2015-12-29 NOTE — Telephone Encounter (Signed)
Rx has been sent to the pharmacy electronically. ° °

## 2016-01-02 DIAGNOSIS — J449 Chronic obstructive pulmonary disease, unspecified: Secondary | ICD-10-CM | POA: Diagnosis not present

## 2016-01-02 DIAGNOSIS — D631 Anemia in chronic kidney disease: Secondary | ICD-10-CM | POA: Diagnosis not present

## 2016-01-02 DIAGNOSIS — I130 Hypertensive heart and chronic kidney disease with heart failure and stage 1 through stage 4 chronic kidney disease, or unspecified chronic kidney disease: Secondary | ICD-10-CM | POA: Diagnosis not present

## 2016-01-02 DIAGNOSIS — N183 Chronic kidney disease, stage 3 (moderate): Secondary | ICD-10-CM | POA: Diagnosis not present

## 2016-01-02 DIAGNOSIS — I5032 Chronic diastolic (congestive) heart failure: Secondary | ICD-10-CM | POA: Diagnosis not present

## 2016-01-02 DIAGNOSIS — E1122 Type 2 diabetes mellitus with diabetic chronic kidney disease: Secondary | ICD-10-CM | POA: Diagnosis not present

## 2016-01-04 ENCOUNTER — Ambulatory Visit: Payer: Self-pay

## 2016-01-05 ENCOUNTER — Encounter (HOSPITAL_COMMUNITY)
Admission: RE | Admit: 2016-01-05 | Discharge: 2016-01-05 | Disposition: A | Discharge status: Home / Self Care | Payer: Medicare Other | Source: Ambulatory Visit | Attending: Nephrology | Admitting: Nephrology

## 2016-01-05 DIAGNOSIS — E1122 Type 2 diabetes mellitus with diabetic chronic kidney disease: Secondary | ICD-10-CM | POA: Diagnosis not present

## 2016-01-05 DIAGNOSIS — I5032 Chronic diastolic (congestive) heart failure: Secondary | ICD-10-CM | POA: Diagnosis not present

## 2016-01-05 DIAGNOSIS — N183 Chronic kidney disease, stage 3 unspecified: Secondary | ICD-10-CM

## 2016-01-05 DIAGNOSIS — J449 Chronic obstructive pulmonary disease, unspecified: Secondary | ICD-10-CM | POA: Diagnosis not present

## 2016-01-05 DIAGNOSIS — I130 Hypertensive heart and chronic kidney disease with heart failure and stage 1 through stage 4 chronic kidney disease, or unspecified chronic kidney disease: Secondary | ICD-10-CM | POA: Diagnosis not present

## 2016-01-05 DIAGNOSIS — D631 Anemia in chronic kidney disease: Secondary | ICD-10-CM | POA: Diagnosis not present

## 2016-01-05 LAB — POCT HEMOGLOBIN-HEMACUE: Hemoglobin: 11 g/dL — ABNORMAL LOW (ref 12.0–15.0)

## 2016-01-05 MED ORDER — EPOETIN ALFA 40000 UNIT/ML IJ SOLN: 30000.0000 [IU] | INTRAMUSCULAR | Status: DC

## 2016-01-05 MED ORDER — EPOETIN ALFA 20000 UNIT/ML IJ SOLN
INTRAMUSCULAR | Status: AC
  Administered 2016-01-05: 20000 [IU] via SUBCUTANEOUS
  Filled 2016-01-05: qty 1

## 2016-01-05 MED ORDER — EPOETIN ALFA 10000 UNIT/ML IJ SOLN
INTRAMUSCULAR | Status: AC
  Administered 2016-01-05: 10000 [IU] via SUBCUTANEOUS
  Filled 2016-01-05: qty 1

## 2016-01-06 ENCOUNTER — Emergency Department (HOSPITAL_COMMUNITY)
Admission: EM | Admit: 2016-01-06 | Discharge: 2016-01-06 | Disposition: A | Discharge status: Home / Self Care | Payer: Medicare Other | Attending: Emergency Medicine | Admitting: Emergency Medicine

## 2016-01-06 ENCOUNTER — Encounter (HOSPITAL_COMMUNITY): Payer: Self-pay | Admitting: *Deleted

## 2016-01-06 ENCOUNTER — Emergency Department (HOSPITAL_COMMUNITY): Payer: Medicare Other

## 2016-01-06 DIAGNOSIS — I130 Hypertensive heart and chronic kidney disease with heart failure and stage 1 through stage 4 chronic kidney disease, or unspecified chronic kidney disease: Secondary | ICD-10-CM | POA: Diagnosis not present

## 2016-01-06 DIAGNOSIS — W1809XA Striking against other object with subsequent fall, initial encounter: Secondary | ICD-10-CM | POA: Diagnosis not present

## 2016-01-06 DIAGNOSIS — Z79899 Other long term (current) drug therapy: Secondary | ICD-10-CM | POA: Insufficient documentation

## 2016-01-06 DIAGNOSIS — Z7982 Long term (current) use of aspirin: Secondary | ICD-10-CM | POA: Diagnosis not present

## 2016-01-06 DIAGNOSIS — Z87891 Personal history of nicotine dependence: Secondary | ICD-10-CM | POA: Diagnosis not present

## 2016-01-06 DIAGNOSIS — J449 Chronic obstructive pulmonary disease, unspecified: Secondary | ICD-10-CM | POA: Diagnosis not present

## 2016-01-06 DIAGNOSIS — Y939 Activity, unspecified: Secondary | ICD-10-CM | POA: Diagnosis not present

## 2016-01-06 DIAGNOSIS — Y929 Unspecified place or not applicable: Secondary | ICD-10-CM | POA: Diagnosis not present

## 2016-01-06 DIAGNOSIS — M7989 Other specified soft tissue disorders: Secondary | ICD-10-CM | POA: Diagnosis not present

## 2016-01-06 DIAGNOSIS — I509 Heart failure, unspecified: Secondary | ICD-10-CM | POA: Insufficient documentation

## 2016-01-06 DIAGNOSIS — Y999 Unspecified external cause status: Secondary | ICD-10-CM | POA: Diagnosis not present

## 2016-01-06 DIAGNOSIS — M25552 Pain in left hip: Secondary | ICD-10-CM | POA: Diagnosis not present

## 2016-01-06 DIAGNOSIS — Z96652 Presence of left artificial knee joint: Secondary | ICD-10-CM | POA: Insufficient documentation

## 2016-01-06 DIAGNOSIS — N183 Chronic kidney disease, stage 3 (moderate): Secondary | ICD-10-CM | POA: Diagnosis not present

## 2016-01-06 DIAGNOSIS — E1122 Type 2 diabetes mellitus with diabetic chronic kidney disease: Secondary | ICD-10-CM | POA: Insufficient documentation

## 2016-01-06 DIAGNOSIS — M25562 Pain in left knee: Secondary | ICD-10-CM | POA: Diagnosis not present

## 2016-01-06 DIAGNOSIS — S79912A Unspecified injury of left hip, initial encounter: Secondary | ICD-10-CM | POA: Diagnosis not present

## 2016-01-06 DIAGNOSIS — S8992XA Unspecified injury of left lower leg, initial encounter: Secondary | ICD-10-CM | POA: Insufficient documentation

## 2016-01-06 DIAGNOSIS — I2510 Atherosclerotic heart disease of native coronary artery without angina pectoris: Secondary | ICD-10-CM | POA: Insufficient documentation

## 2016-01-06 DIAGNOSIS — Z8673 Personal history of transient ischemic attack (TIA), and cerebral infarction without residual deficits: Secondary | ICD-10-CM | POA: Diagnosis not present

## 2016-01-06 DIAGNOSIS — Z794 Long term (current) use of insulin: Secondary | ICD-10-CM | POA: Insufficient documentation

## 2016-01-06 MED ORDER — TRAMADOL HCL 50 MG PO TABS: 50.0000 mg | ORAL_TABLET | Freq: Three times a day (TID) | ORAL | 0 refills | Status: DC | PRN

## 2016-01-06 MED ORDER — TRAMADOL HCL 50 MG PO TABS
50.0000 mg | ORAL_TABLET | Freq: Once | ORAL | Status: AC
  Administered 2016-01-06: 50 mg via ORAL
  Filled 2016-01-06: qty 1

## 2016-01-06 NOTE — ED Provider Notes (Signed)
[facility] DEPT Provider Note   CSN: [id number] Arrival date & time: 01/06/16  1344     History   Chief Complaint Chief Complaint  Patient presents with  . Knee Pain    HPI [name] is a 70 y.o. female.  Joann history is provided by Joann patient and Joann spouse. No language interpreter was used.  Knee Pain   This is a new problem. Joann current episode started 2 days ago. Joann problem occurs constantly. Joann pain is present in Joann left knee. Associated symptoms include limited range of motion. Pertinent negatives include no numbness. Joann symptoms are aggravated by standing and activity.     Patient is a 40 F with a PMH as noted below who p/w L knee pain after a fall 2 days ago. States was getting into bed when she fell onto carpeted surface directly onto L knee. Initially had mild pain but states this has gradually worsened since then. Prior to injury was using a cane to get around, now states pain is so severe that she is having significant trouble ambulating. Denies injury elsewhere. States her ROM is decreased due to pain in knee. Pain radiates to midway through L thigh. Denies numbness or tingling distal to injury. No recent fever, chills, nausea, vomiting, diarrhea, chest pain, SOB. Does Joann remember when her TKA was but states it was several years ago. But review of chart shows TKA was in 1998. Pain worsens with flexion.   Past Medical History:  Diagnosis Date  . Adenomatous colon polyp   . Allergy   . Anemia   . Asthma       . CAD (coronary artery disease)    Mild very minimal coronary disease with 20% obtuse marginal stenosis  . Carpal tunnel syndrome on left   . CHF (congestive heart failure) ([place])   . Chronic kidney disease (CKD), stage III (moderate)   . COPD (chronic obstructive pulmonary disease) ([place])   . CVA (cerebral infarction)   . Depression   . 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 Diverticulosis   . Elevated diaphragm November 2011   Right side    . Esophageal dysmotility   . Esophageal stricture   . Gastritis   . Gastroparesis 08/21/2007  . GERD (gastroesophageal reflux disease)   . Hair loss   . Hearing loss of both ears   . Hernia, hiatal   . Hyperlipidemia   . Hypertension   . Morbid obesity ([place])   . Obesity   . Osteoarthritis   . OSTEOARTHRITIS 08/09/2006  . Osteoporosis   . 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 RENAL INSUFFICIENCY 02/16/2009  . 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 Seizures ([place])    Joann thinks it has been several monthes since she had a seisure  . Sickle cell trait ([place])   . Stroke ([place])   . Tubular adenoma of colon     Patient Active Problem List   Diagnosis Date Noted  . Postherpetic neuralgia 12/21/2015  . Herpes zoster with complication 24/26/8341  . Cellulitis of face 11/27/2015  . Anemia of chronic disease 08/17/2015  . History of colonic polyps   . Hypertensive emergency 05/20/2015  . Hypokalemia 05/20/2015  . AKI (acute kidney injury) ([place])   . Esophageal dysmotility 03/01/2015  . Throat congestion 10/11/2014  . Severe obesity (BMI >= 40) ([place]) 09/23/2014  . Acute asthmatic bronchitis 05/20/2014  . Abdominal pain, other specified site 09/13/2013  . Elevated liver enzymes 09/13/2013  .  Cough 05/05/2013  . Pain in lower limb 03/05/2013  . Dyspnea 02/03/2013  . Chronic respiratory failure ([place]) 12/13/2012  . Stricture and stenosis of esophagus 08/31/2012  . Onychomycosis 06/01/2012  . Pain in joint, ankle and foot 06/01/2012  . Insulin dependent diabetes mellitus ([place]) 04/29/2012  . Essential hypertension 07/19/2011  . CKD (chronic kidney disease), stage III 05/19/2011  . Family history of malignant neoplasm of gastrointestinal tract 10/30/2010  . Bloating 10/16/2010  . Epigastric pain 10/16/2010  . Foot pain 09/25/2010  . Dysphagia 07/16/2010  . GERD (gastroesophageal reflux disease) 07/16/2010  . Microcytic anemia 07/16/2010  . Obstructive sleep apnea 06/16/2009   . HYPERCHOLESTEROLEMIA 02/16/2009  . 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 Gastroparesis 08/21/2007  . Chronic obstructive asthma ([place]) 08/09/2006    Past Surgical History:  Procedure Laterality Date  . ABDOMINAL HYSTERECTOMY    . ARTERY BIOPSY  01/07/2011   Procedure: MINOR BIOPSY TEMPORAL ARTERY;  Surgeon: Haywood Lasso, MD;  Location: [facility];  Service: General;  Laterality: Left;  left temporal artery biopsy  . bil foot surgery    . BREAST LUMPECTOMY     benign  . BREAST LUMPECTOMY     both breast lumps removed   . COLONOSCOPY    . COLONOSCOPY WITH PROPOFOL N/A 07/05/2015   Procedure: COLONOSCOPY WITH PROPOFOL;  Surgeon: Manus Gunning, MD;  Location: [facility];  Service: Gastroenterology;  Laterality: N/A;  . ERD  08/08/2000  . ESOPHAGEAL MANOMETRY N/A 03/13/2015   Procedure: ESOPHAGEAL MANOMETRY (EM);  Surgeon: Manus Gunning, MD;  Location: [facility];  Service: Gastroenterology;  Laterality: N/A;  . ESOPHAGOGASTRODUODENOSCOPY  06/25/2006  . ESOPHAGOGASTRODUODENOSCOPY (EGD) WITH PROPOFOL N/A 07/05/2015   Procedure: ESOPHAGOGASTRODUODENOSCOPY (EGD) WITH PROPOFOL;  Surgeon: Manus Gunning, MD;  Location: [facility];  Service: Gastroenterology;  Laterality: N/A;  . HERNIA REPAIR    . LEFT AND RIGHT HEART CATHETERIZATION WITH CORONARY ANGIOGRAM N/A 03/03/2013   Procedure: LEFT AND RIGHT HEART CATHETERIZATION WITH CORONARY ANGIOGRAM;  Surgeon: Minus Breeding, MD;  Location: [facility] CATH LAB;  Service: Cardiovascular;  Laterality: N/A;  . REPLACEMENT TOTAL KNEE Left 1998  . UPPER GASTROINTESTINAL ENDOSCOPY      OB History    No data available       Home Medications    Prior to Admission medications   Medication Sig Start Date End Date Taking? Authorizing Provider  albuterol (VENTOLIN HFA) 108 (90 BASE) MCG/ACT inhaler Inhale 2 puffs into Joann lungs every 6 (six) hours as needed for shortness of breath. 11/10/14   [name],  MD  aspirin EC 81 MG tablet Take 81 mg by mouth daily.    Historical Provider, MD  atorvastatin (LIPITOR) 40 MG tablet Take 40 mg by mouth daily.    Historical Provider, MD  bacitracin ointment Apply topically 2 (two) times daily. Apply to Joann rash until resolved 12/03/15   Ripudeep K Rai, MD  Calcium Carbonate-Vit D-Min (CALTRATE PLUS PO) Take 600 mg by mouth every morning.     Historical Provider, MD  donepezil (ARICEPT) 10 MG tablet Take 10 mg by mouth at bedtime.     Historical Provider, MD  doxazosin (CARDURA) 2 MG tablet Take 4 mg by mouth at bedtime.  10/10/14   Historical Provider, MD  Epoetin Alfa (PROCRIT IJ) Inject 1 each as directed once a week.    Historical Provider, MD  ezetimibe (ZETIA) 10 MG tablet Take 10 mg by mouth at bedtime.     Historical  Provider, MD  fluticasone (FLONASE) 50 MCG/ACT nasal spray PLACE TWO SPRAYS INTO BOTH NOSTRILS DAILY 12/29/15   [name], MD  furosemide (LASIX) 80 MG tablet Take 1 tablet (80 mg total) by mouth daily. 12/04/15   Ripudeep Krystal Eaton, MD  gabapentin (NEURONTIN) 300 MG capsule Take 1 capsule (300 mg total) by mouth 2 (two) times daily. 12/21/15   Michel Bickers, MD  hydrALAZINE (APRESOLINE) 100 MG tablet Take 100 mg by mouth 3 (three) times daily.    Historical Provider, MD  insulin glargine (LANTUS) 100 UNIT/ML injection Inject 0.15 mLs (15 Units total) into Joann skin daily. Patient taking differently: Inject 15 Units into Joann skin at bedtime.  09/18/13   Maryann Mikhail, DO  insulin NPH (HUMULIN N) 100 UNIT/ML injection Inject 2-13 Units into Joann skin 3 (three) times daily after meals. Sliding scale 07/07/12   Renato Shin, MD  isosorbide mononitrate (IMDUR) 120 MG 24 hr tablet Take 1 tablet (120 mg total) by mouth daily. PATIENT NEEDS TO CONTACT OFFICE FOR ADDITIONAL REFILLS 01/11/15   Minus Breeding, MD  KLOR-CON M20 20 MEQ tablet Take 20 mEq by mouth daily.  06/19/11   Historical Provider, MD  mometasone-formoterol (DULERA) 100-5 MCG/ACT AERO  Inhale 2 puffs into Joann lungs 2 (two) times daily.    Historical Provider, MD  montelukast (SINGULAIR) 10 MG tablet TAKE ONE (1) TABLET BY MOUTH EVERY DAY 12/29/15   [name], MD  Multiple Vitamins-Minerals (MULTIVITAMIN GUMMIES ADULT) CHEW Chew 1 capsule by mouth daily.    Historical Provider, MD  nitroGLYCERIN (NITROSTAT) 0.4 MG SL tablet PLACE ONE TABLET UNDER Joann TONGUE EVERY FIVE MINUTES AS NEEDED FOR CHEST PAIN 12/29/15   Minus Breeding, MD  omeprazole (PRILOSEC) 20 MG capsule Take 1 capsule (20 mg total) by mouth 2 (two) times daily before a meal. 09/18/15 12/14/15  Manus Gunning, MD  ondansetron (ZOFRAN ODT) 4 MG disintegrating tablet Take 1 tablet (4 mg total) by mouth every 4 (four) hours as needed for nausea or vomiting. 12/14/15   Charlesetta Shanks, MD  OXYGEN Inhale into Joann lungs. CPAP with oxygen at bedtime    Historical Provider, MD  polyvinyl alcohol (LIQUIFILM TEARS) 1.4 % ophthalmic solution Place 1 drop into both eyes as needed for dry eyes. 12/04/15   Ripudeep Krystal Eaton, MD  raloxifene (EVISTA) 60 MG tablet Take 60 mg by mouth every morning.     Historical Provider, MD  SYMBICORT 160-4.5 MCG/ACT inhaler INHALE 2 PUFFS INTO Joann LUNGS 2 TIMES DAILY 11/23/15   Tammy S Parrett, NP  traMADol (ULTRAM) 50 MG tablet Take 1 tablet (50 mg total) by mouth every 8 (eight) hours as needed for moderate pain or severe pain. Reported on 02/07/2015 01/06/16   Joann Quay, MD  Vitamin D, Ergocalciferol, (DRISDOL) 50000 UNITS CAPS capsule Take 50,000 Units by mouth every Monday.     Historical Provider, MD    Family History Family History  Problem Relation Age of Onset  . Liver cancer Mother     Liver Cancer  . Diabetes Mother   . Kidney disease Mother   . Heart disease Mother     age 67's  . Heart disease Father 56    MI  . Heart attack Father     died of MI when Joann was 60  . Sickle cell anemia Father   . Colon cancer Brother   . Cancer Brother     Colon Cancer  . Diabetes Sister     . Kidney  disease Sister   . Heart disease Sister     age 15's  . Allergies Sister   . Diabetes Sister   . Kidney disease Sister   . Heart disease Sister     age 33's  . Esophageal cancer Neg Hx   . Rectal cancer Neg Hx   . Stomach cancer Neg Hx     Social History Social History  Substance Use Topics  . Smoking status: Former Smoker    Packs/day: 0.50    Years: 10.00    Quit date: 04/18/1980  . Smokeless tobacco: Never Used  . Alcohol use No     Allergies   Morphine and related and Promethazine hcl   Review of Systems Review of Systems  Constitutional: Negative for chills and fever.  Gastrointestinal: Negative for nausea and vomiting.  Musculoskeletal: Positive for arthralgias (L knee), gait problem (due to L knee pain, used cane to ambulate prior to injury 2 d ago) and myalgias (around L knee).  Skin: Negative for wound.  Neurological: Negative for numbness.  All other systems reviewed and are negative.    Physical Exam Updated Vital Signs BP (!) 202/84   Pulse 76   Temp 98 F (36.7 C) (Oral)   Resp 16   Wt 122.5 kg   SpO2 93%   BMI 46.35 kg/m   Physical Exam  Constitutional: No distress.  HENT:  Head: Normocephalic and atraumatic.  Eyes: Conjunctivae are normal.  Neck: Neck supple.  Cardiovascular: Normal rate and regular rhythm.   Pulmonary/Chest: Effort normal and breath sounds normal. No respiratory distress.  Abdominal: Soft. She exhibits no distension.  Musculoskeletal: She exhibits no edema.       Left knee: She exhibits decreased range of motion. She exhibits no swelling. Tenderness found.       Legs: Neurological: She is alert.  Sensation intact throughout L foot distal to noted injury  Skin: Skin is warm and dry. She is Joann diaphoretic.  Psychiatric: She has a normal mood and affect.  Nursing note and vitals reviewed.    ED Treatments / Results  Labs (all labs ordered are listed, but only abnormal results are displayed) Labs  Reviewed - No data to display  EKG  EKG Interpretation None       Radiology Dg Knee Complete 4 Views Left  Result Date: 01/06/2016 CLINICAL DATA:  Left knee pain and swelling. EXAM: LEFT KNEE - COMPLETE 4+ VIEW COMPARISON:  None. FINDINGS: No evidence of fracture, dislocation, or joint effusion. There has been a prior 3 component total left knee arthroplasty without evidence of hardware fracture, loosening or misalignment. IMPRESSION: Status post total left knee arthroplasty. No evidence of acute fracture or hardware failure. Electronically Signed   By: Fidela Salisbury M.D.   On: 01/06/2016 15:06   Dg Hip Unilat W Or Wo Pelvis 2-3 Views Left  Result Date: 01/06/2016 CLINICAL DATA:  Left hip pain after falling 2 days ago. EXAM: DG HIP (WITH OR WITHOUT PELVIS) 2-3V LEFT COMPARISON:  05/22/2015. FINDINGS: Diffuse osteopenia. No fracture or dislocation seen. Mild lower lumbar spine degenerative changes. IMPRESSION: No fracture or dislocation. Electronically Signed   By: Claudie Revering M.D.   On: 01/06/2016 17:00    Procedures Procedures (including critical care time)  Medications Ordered in ED Medications  traMADol (ULTRAM) tablet 50 mg (50 mg Oral Given 01/06/16 1552)     Initial Impression / Assessment and Plan / ED Course  I have reviewed Joann triage vital signs and Joann  nursing notes.  Pertinent labs & imaging results that were available during my care of Joann patient were reviewed by me and considered in my medical decision making (see chart for details).  Clinical Course      This is a 70 year old female who presents today with left knee pain after fall 2 days ago. On my exam Joann patient is neurovascularly intact distal to Joann injury site. Her vital signs are within normal limits. There is no significant swelling when compared to Joann contralateral side. No evidence of infection. Obtained plain films of Joann hip and knee and both these do Joann reveal any acute changes. At this  point, I feel that Joann patient would benefit from seeing orthopedic surgery as her hardware is nearly 70 years old and likely well past Joann lifetime of this hardware. This could be contributing to her pain as well. We will put her on tramadol for pain control and advised her to be very careful as this could be a medication that could increase her risk of falls. Patient was discharged home in good condition.  Final Clinical Impressions(s) / ED Diagnoses   Final diagnoses:  Acute pain of left knee    New Prescriptions Discharge Medication List as of 01/06/2016  5:06 PM       Joann Quay, MD 01/07/16 0020    Carmin Muskrat, MD 01/07/16 [phone]

## 2016-01-06 NOTE — ED Triage Notes (Signed)
Pt is here for left knee pain since Thursday when she fell and struck knee trying to get into bed. Pt has had increased difficulty with ambulation, pt was ambulatory with walker/cane before.  Pt has had knee surgery in the past on this knee.

## 2016-01-06 NOTE — ED Notes (Signed)
Pt is in stable condition upon d/c and is escorted from ED via wheelchair. 

## 2016-01-06 NOTE — ED Notes (Signed)
Pt will be brought from xray to room  

## 2016-01-11 ENCOUNTER — Ambulatory Visit: Payer: Medicare Other | Admitting: Podiatry

## 2016-01-12 ENCOUNTER — Inpatient Hospital Stay (HOSPITAL_COMMUNITY): Admission: RE | Admit: 2016-01-12 | Payer: Medicare Other | Source: Ambulatory Visit

## 2016-01-12 IMAGING — RF DG ESOPHAGUS
9 of 11 series · 14 of 24 positions shown · non-contrast
Comparison: CT abdomen 09/11/2013

CLINICAL DATA: Feeling of food getting stuck in throat. Mucous
accumulation.

EXAM:
ESOPHOGRAM / BARIUM SWALLOW / BARIUM TABLET STUDY
TECHNIQUE: Combined double contrast and single contrast examination performed
using effervescent crystals, thick barium liquid, and thin barium
liquid. The patient was observed with fluoroscopy swallowing a 13 mm
barium sulphate tablet.
FLUOROSCOPY TIME:  Radiation Exposure Index (as provided by the
fluoroscopic device):
If the device does not provide the exposure index:
Fluoroscopy Time:  1 minutes 31 seconds
Number of Acquired Images:  11

[Series 1: run · 1 of 1 slices shown (1 of 9)]
[im 1/1]
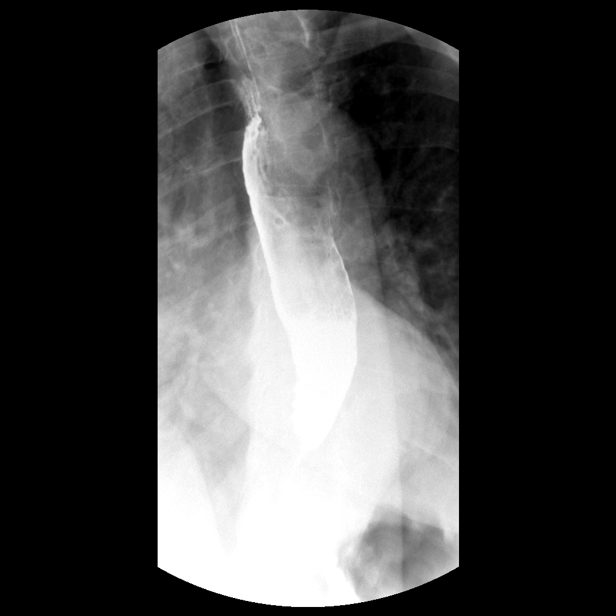

[Series 3: run · 1 of 2 slices shown (2 of 9)]
[im 1/2]
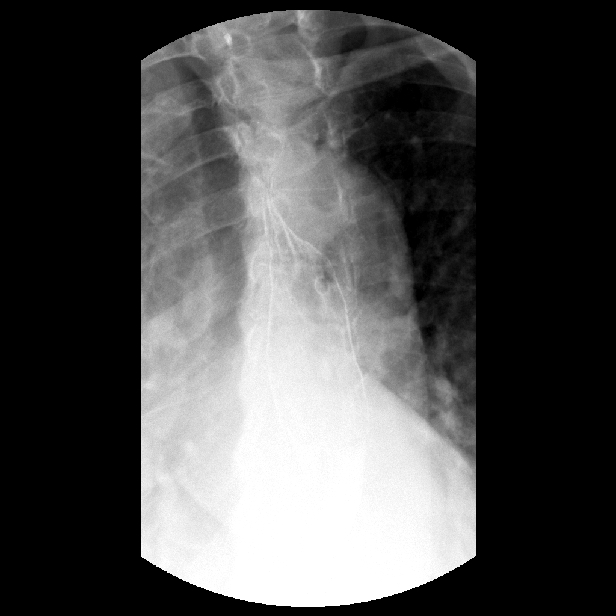

[Series 4: run · 2 of 7 slices shown (3 of 9)]
[im 3/7]
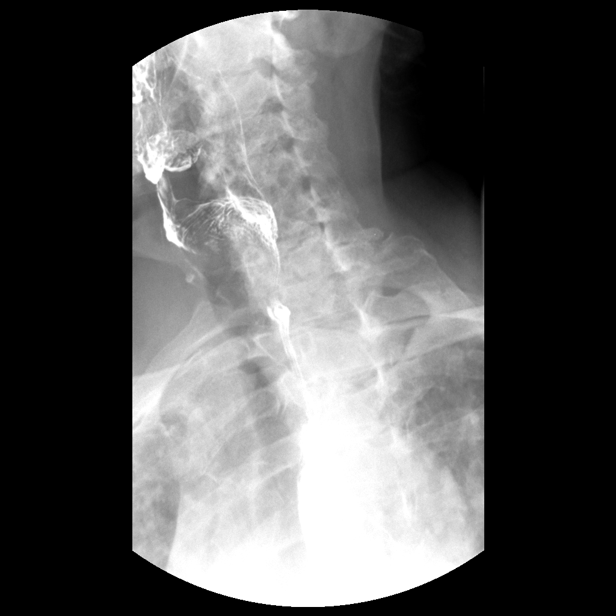
[im 7/7]
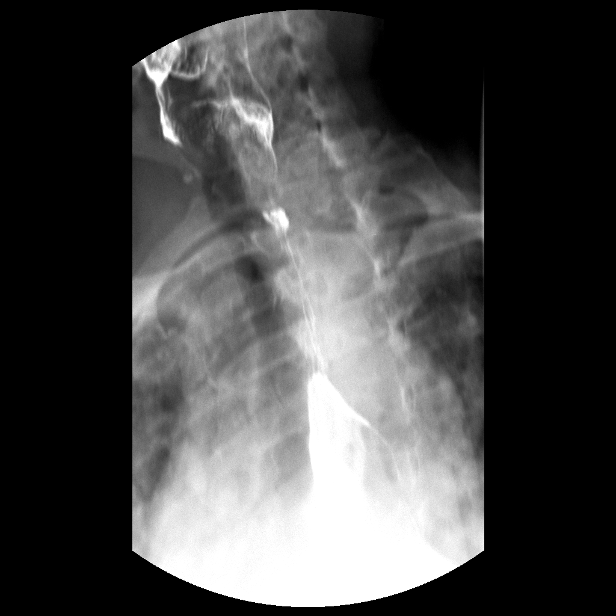

[Series 5: run · 1 of 3 slices shown (4 of 9)]
[im 1/3]
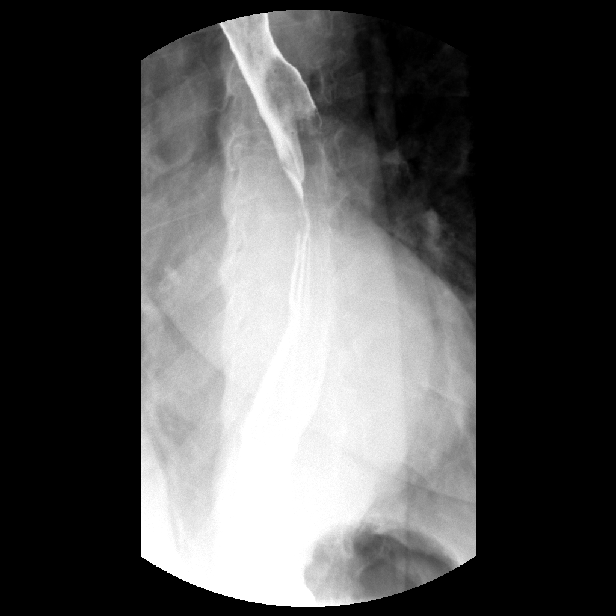

[Series 6: run · 4 of 8 slices shown (5 of 9)]
[im 1/8]
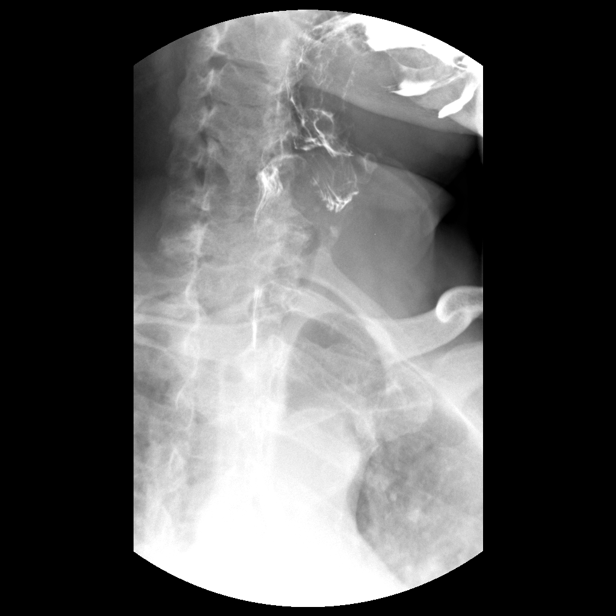
[im 3/8]
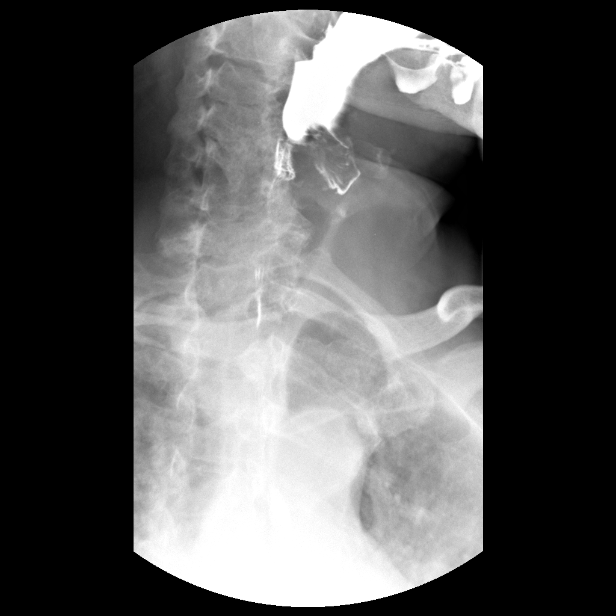
[im 5/8]
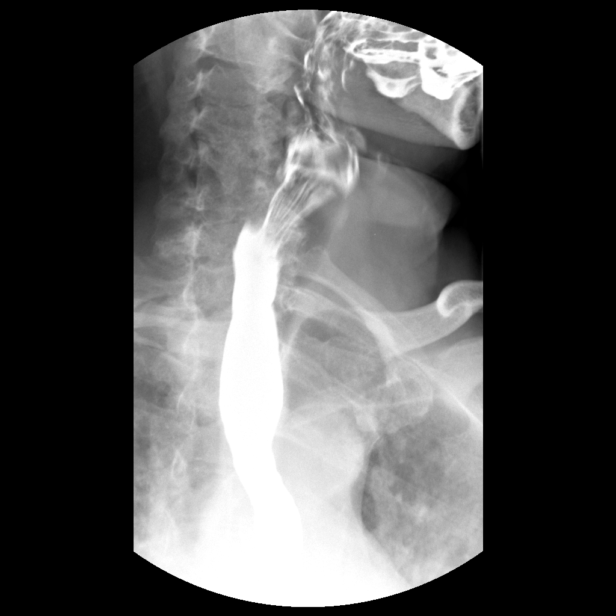
[im 8/8]
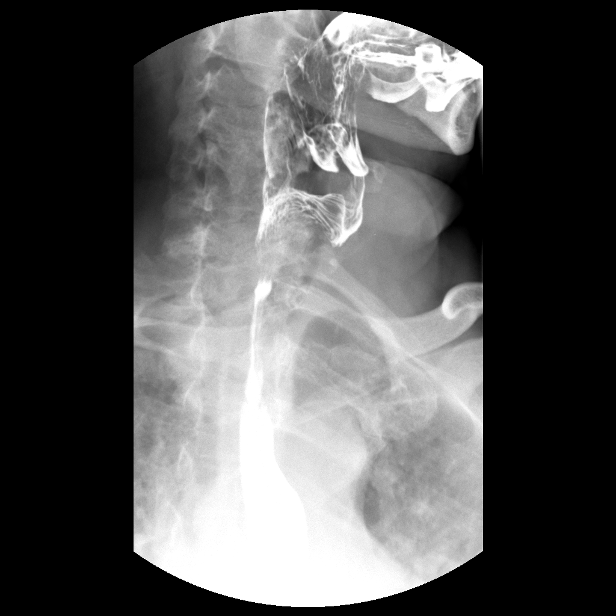

[Series 7: run · 1 of 4 slices shown (6 of 9)]
[im 2/4]
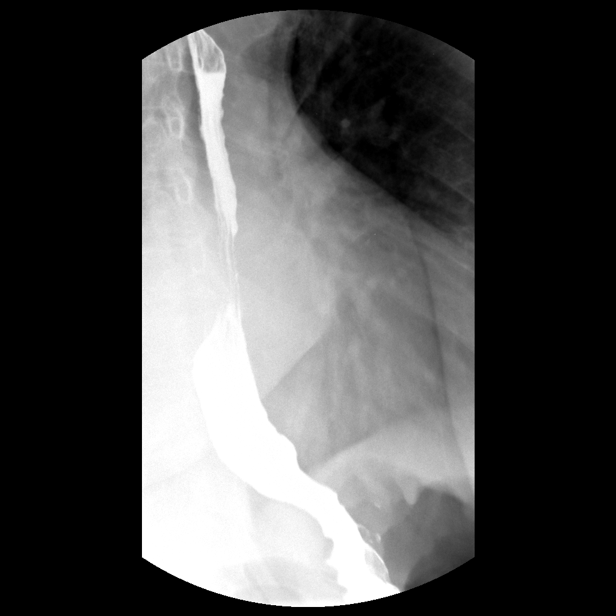

[Series 8: run · 2 of 2 slices shown (7 of 9)]
[im 1/2]
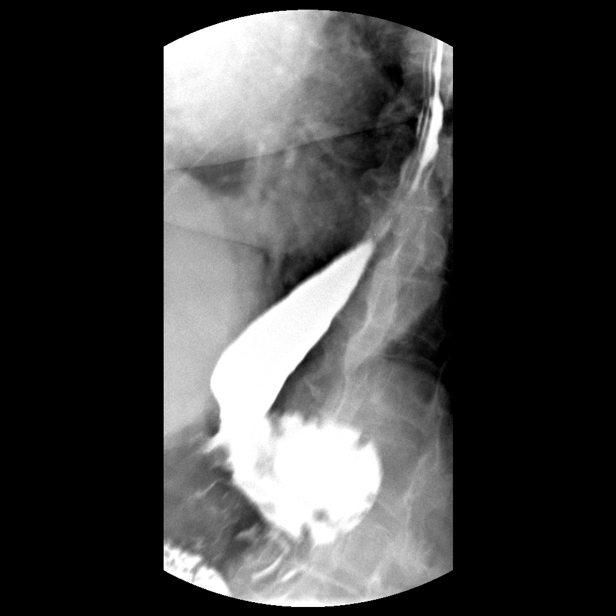
[im 2/2]
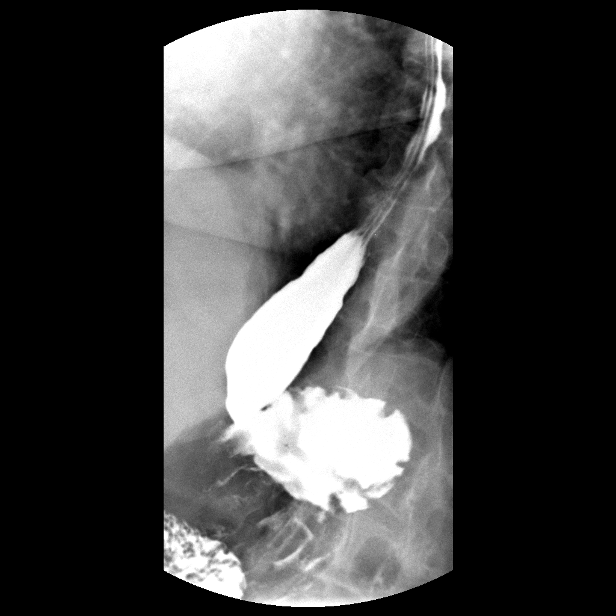

[Series 9: run · 1 of 2 slices shown (8 of 9)]
[im 2/2]
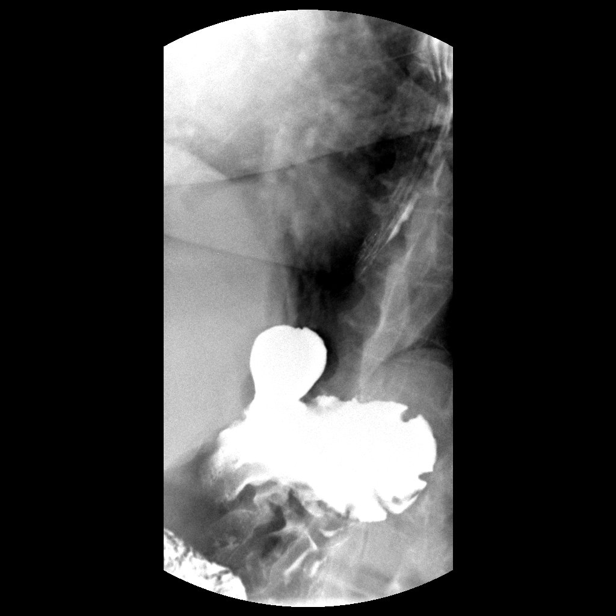

[Series 11: run · 1 of 1 slices shown (9 of 9)]
[im 1/1]
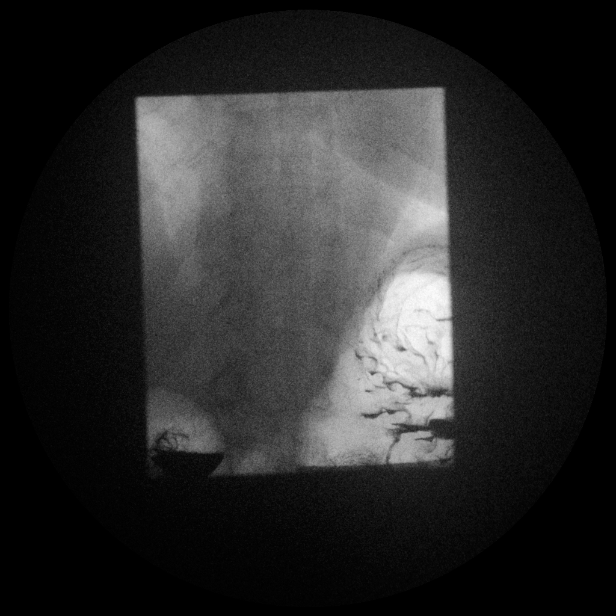

[14 of 24 positions shown; findings below may reference images not displayed]

FINDINGS: No mucosal irregularity, stricture, or mass within the thoracic
esophagus or distal esophagus. Normal swallowing function in the
high cervical esophagus.

There was escape of the primary barium bolus from the primary
stripping wave with some to and fro motion. Mild esophageal reflux.
13 mm barium tab passed the GE junction easily.
IMPRESSION: 1. Mild esophageal dysmotility most suggestive of presbyesophagus.
2. Mild esophageal reflux.
3. No esophageal mass lesion or stricture.

## 2016-01-18 ENCOUNTER — Other Ambulatory Visit: Payer: Self-pay

## 2016-01-18 NOTE — Patient Outreach (Signed)
[facility]) Care Management  01/18/2016   Joann Gutierrez [birth year] [id number]  Subjective:  I am doing much better, I had some pain in my knees the other night and went to the emergency room. They did not keep me the other night.  Objective:   Telephonic encounter  Current Medications:  Current Outpatient Prescriptions  Medication Sig Dispense Refill  . albuterol (VENTOLIN HFA) 108 (90 BASE) MCG/ACT inhaler Inhale 2 puffs into the lungs every 6 (six) hours as needed for shortness of breath. 1 Inhaler 11  . aspirin EC 81 MG tablet Take 81 mg by mouth daily.    Marland Kitchen atorvastatin (LIPITOR) 40 MG tablet Take 40 mg by mouth daily.    . bacitracin ointment Apply topically 2 (two) times daily. Apply to the rash until resolved 120 g 2  . Calcium Carbonate-Vit D-Min (CALTRATE PLUS PO) Take 600 mg by mouth every morning.     . donepezil (ARICEPT) 10 MG tablet Take 10 mg by mouth at bedtime.     Marland Kitchen doxazosin (CARDURA) 2 MG tablet Take 4 mg by mouth at bedtime.     Marland Kitchen Epoetin Alfa (PROCRIT IJ) Inject 1 each as directed once a week.    . ezetimibe (ZETIA) 10 MG tablet Take 10 mg by mouth at bedtime.     . fluticasone (FLONASE) 50 MCG/ACT nasal spray PLACE TWO SPRAYS INTO BOTH NOSTRILS DAILY 16 g 5  . furosemide (LASIX) 80 MG tablet Take 1 tablet (80 mg total) by mouth daily.    Marland Kitchen gabapentin (NEURONTIN) 300 MG capsule Take 1 capsule (300 mg total) by mouth 2 (two) times daily. 60 capsule 5  . hydrALAZINE (APRESOLINE) 100 MG tablet Take 100 mg by mouth 3 (three) times daily.    . insulin glargine (LANTUS) 100 UNIT/ML injection Inject 0.15 mLs (15 Units total) into the skin daily. (Patient taking differently: Inject 15 Units into the skin at bedtime. ) 10 mL 11  . insulin NPH (HUMULIN N) 100 UNIT/ML injection Inject 2-13 Units into the skin 3 (three) times daily after meals. Sliding scale    . isosorbide mononitrate (IMDUR) 120 MG 24 hr tablet Take 1 tablet (120 mg total) by  mouth daily. PATIENT NEEDS TO CONTACT OFFICE FOR ADDITIONAL REFILLS 15 tablet 0  . KLOR-CON M20 20 MEQ tablet Take 20 mEq by mouth daily.     . mometasone-formoterol (DULERA) 100-5 MCG/ACT AERO Inhale 2 puffs into the lungs 2 (two) times daily.    . montelukast (SINGULAIR) 10 MG tablet TAKE ONE (1) TABLET BY MOUTH EVERY DAY 30 tablet 5  . Multiple Vitamins-Minerals (MULTIVITAMIN GUMMIES ADULT) CHEW Chew 1 capsule by mouth daily.    . nitroGLYCERIN (NITROSTAT) 0.4 MG SL tablet PLACE ONE TABLET UNDER THE TONGUE EVERY FIVE MINUTES AS NEEDED FOR CHEST PAIN 25 tablet 2  . omeprazole (PRILOSEC) 20 MG capsule Take 1 capsule (20 mg total) by mouth 2 (two) times daily before a meal. 28 capsule 0  . ondansetron (ZOFRAN ODT) 4 MG disintegrating tablet Take 1 tablet (4 mg total) by mouth every 4 (four) hours as needed for nausea or vomiting. 20 tablet 0  . OXYGEN Inhale into the lungs. CPAP with oxygen at bedtime    . polyvinyl alcohol (LIQUIFILM TEARS) 1.4 % ophthalmic solution Place 1 drop into both eyes as needed for dry eyes. 15 mL 0  . raloxifene (EVISTA) 60 MG tablet Take 60 mg by mouth every morning.     Dellis Anes  160-4.5 MCG/ACT inhaler INHALE 2 PUFFS INTO THE LUNGS 2 TIMES DAILY 10.2 g 4  . traMADol (ULTRAM) 50 MG tablet Take 1 tablet (50 mg total) by mouth every 8 (eight) hours as needed for moderate pain or severe pain. Reported on 02/07/2015 12 tablet 0  . Vitamin D, Ergocalciferol, (DRISDOL) 50000 UNITS CAPS capsule Take 50,000 Units by mouth every Monday.      No current facility-administered medications for this visit.     Functional Status:  In your present state of health, do you have any difficulty performing the following activities: 12/11/2015 12/06/2015  Hearing? N N  Vision? Y Y  Difficulty concentrating or making decisions? [name]  Walking or climbing stairs? Y Y  Dressing or bathing? Y Y  Doing errands, shopping? [name]  Preparing Food and eating ? Y Y  Using the Toilet? N N  In  the past six months, have you accidently leaked urine? N N  Do you have problems with loss of bowel control? N N  Managing your Medications? Y Y  Managing your Finances? Y N  Housekeeping or managing your Housekeeping? [name]  Some recent data might be hidden    Fall/Depression Screening: PHQ 2/9 Scores 12/21/2015 12/11/2015 12/06/2015 08/03/2015 06/05/2015 05/15/2015 04/11/2015  PHQ - 2 Score 0 1 0 0 0 0 0  PHQ- 9 Score - - - - - - -  Exception Documentation - - - - - - -   [facility] CM Care Plan Problem One   Flowsheet Row Most Recent Value  Care Plan Problem One  Patient had recent hospitalization for COPD exacerbation  Role Documenting the Problem One  Care Management [place] for Problem One  Active  [facility] Long Term Goal (31-90 days)  Patient will have no acute care admission for the next 31 days.  [facility] Long Term Goal Start Date  12/06/15  [facility] Long Term Goal Met Date  01/18/16  Interventions for Problem One Long Term Goal  telephone follow up.  Patient states she is feeling much better.  Patient also reports she is compliant with medication regimen  [facility] CM Short Term Goal #2 (0-30 days)  In the next 21 days, patient and significant other will meet with [facility] for heart failure education  [facility] CM Short Term Goal #2 Start Date  12/06/15  [facility] CM Short Term Goal #2 Met Date  01/18/16  Interventions for Short Term Goal #2  telephone follow up fo assess patient's compliance with daily weights.  Patient reports compliance with daily weights.      [facility] CM Care Plan Problem Two   Flowsheet Row Most Recent Value  Care Plan Problem Two  Patient is not consistently weighing  Role Documenting the Problem Two  Care Management Coordinator  Care Plan for Problem Two  Active  [facility] CM Short Term Goal #1 (0-30 days)  In the next 28 days, patient will weight 21 out of 28 times and record.    [facility] CM Short Term Goal #1 Start Date  12/06/15  [facility] CM Short Term Goal #1 Met Date    01/18/16  Interventions for Short Term Goal #2   Patient and her caregiver both states patient has been recording patient has been measuring and recording her blucose levels     Assessment:  Patient has progressed and completed meeting her case management goals. Patient agrees to discharge at this time from case management, however, she does understand she can  self refer if further case management needs arise. Patient has [facility]'s telephone number for use if needed.  Plan:  Discharge from caseload as she has met her case management goals has been met.

## 2016-01-19 ENCOUNTER — Encounter (HOSPITAL_COMMUNITY)
Admission: RE | Admit: 2016-01-19 | Discharge: 2016-01-19 | Disposition: A | Discharge status: Home / Self Care | Payer: Medicare Other | Source: Ambulatory Visit | Attending: Nephrology | Admitting: Nephrology

## 2016-01-19 DIAGNOSIS — D631 Anemia in chronic kidney disease: Secondary | ICD-10-CM | POA: Diagnosis not present

## 2016-01-19 DIAGNOSIS — N183 Chronic kidney disease, stage 3 unspecified: Secondary | ICD-10-CM

## 2016-01-19 LAB — RENAL FUNCTION PANEL
ALBUMIN: 3 g/dL — AB (ref 3.5–5.0)
ANION GAP: 10 (ref 5–15)
BUN: 26 mg/dL — ABNORMAL HIGH (ref 6–20)
CALCIUM: 9.2 mg/dL (ref 8.9–10.3)
CO2: 25 mmol/L (ref 22–32)
Chloride: 105 mmol/L (ref 101–111)
Creatinine, Ser: 2.04 mg/dL — ABNORMAL HIGH (ref 0.44–1.00)
GFR calc Af Amer: 27 mL/min — ABNORMAL LOW (ref >60)
GFR calc non Af Amer: 24 mL/min — ABNORMAL LOW (ref >60)
Glucose, Bld: 209 mg/dL — ABNORMAL HIGH (ref 65–99)
PHOSPHORUS: 4.2 mg/dL (ref 2.5–4.6)
Potassium: 4.1 mmol/L (ref 3.5–5.1)
SODIUM: 140 mmol/L (ref 135–145)

## 2016-01-19 LAB — IRON AND TIBC
Iron: 87 ug/dL (ref 28–170)
Saturation Ratios: 42 % — ABNORMAL HIGH (ref 10.4–31.8)
TIBC: 209 ug/dL — AB (ref 250–450)
UIBC: 122 ug/dL

## 2016-01-19 LAB — POCT HEMOGLOBIN-HEMACUE: Hemoglobin: 10 g/dL — ABNORMAL LOW (ref 12.0–15.0)

## 2016-01-19 LAB — FERRITIN: Ferritin: 546 ng/mL — ABNORMAL HIGH (ref 11–307)

## 2016-01-19 MED ORDER — EPOETIN ALFA 10000 UNIT/ML IJ SOLN
INTRAMUSCULAR | Status: AC
  Administered 2016-01-19: 10000 [IU]
  Filled 2016-01-19: qty 1

## 2016-01-19 MED ORDER — EPOETIN ALFA 20000 UNIT/ML IJ SOLN
INTRAMUSCULAR | Status: AC
  Administered 2016-01-19: 11:00:00 20000 [IU]
  Filled 2016-01-19: qty 1

## 2016-01-19 MED ORDER — EPOETIN ALFA 40000 UNIT/ML IJ SOLN: 30000.0000 [IU] | INTRAMUSCULAR | Status: DC

## 2016-01-20 LAB — PTH, INTACT AND CALCIUM
Calcium, Total (PTH): 9.1 mg/dL (ref 8.7–10.3)
PTH: 59 pg/mL (ref 15–65)

## 2016-01-26 ENCOUNTER — Encounter (HOSPITAL_COMMUNITY)
Admission: RE | Admit: 2016-01-26 | Discharge: 2016-01-26 | Disposition: A | Discharge status: Home / Self Care | Payer: Medicare Other | Source: Ambulatory Visit | Attending: Nephrology | Admitting: Nephrology

## 2016-01-26 DIAGNOSIS — D631 Anemia in chronic kidney disease: Secondary | ICD-10-CM | POA: Diagnosis not present

## 2016-01-26 DIAGNOSIS — N183 Chronic kidney disease, stage 3 unspecified: Secondary | ICD-10-CM

## 2016-01-26 LAB — POCT HEMOGLOBIN-HEMACUE: Hemoglobin: 10 g/dL — ABNORMAL LOW (ref 12.0–15.0)

## 2016-01-26 MED ORDER — EPOETIN ALFA 20000 UNIT/ML IJ SOLN
INTRAMUSCULAR | Status: AC
  Administered 2016-01-26: 20000 [IU] via SUBCUTANEOUS
  Filled 2016-01-26: qty 1

## 2016-01-26 MED ORDER — EPOETIN ALFA 40000 UNIT/ML IJ SOLN: 30000.0000 [IU] | INTRAMUSCULAR | Status: DC

## 2016-01-26 MED ORDER — EPOETIN ALFA 10000 UNIT/ML IJ SOLN
INTRAMUSCULAR | Status: AC
  Administered 2016-01-26: 11:00:00 10000 [IU] via SUBCUTANEOUS
  Filled 2016-01-26: qty 1

## 2016-01-27 IMAGING — CT CT HEAD W/O CM
2 series · 15 of 30 positions shown, 19 images · non-contrast
Comparison: 09/12/2013 head CT.  Brain MRI 07/03/2009.

CLINICAL DATA: 69-year-old female with headaches for 3 days,
hypertension. Initial encounter.

EXAM:
CT HEAD WITHOUT CONTRAST
TECHNIQUE: Contiguous axial images were obtained from the base of the skull
through the vertex without intravenous contrast.

[Series 201: head w/o, idose (1) · axial · non-contrast · 0.47mm/px · z∈[+92,+212]mm · 13 of 30 slices shown, 17 images]
[im 3/30  brain]
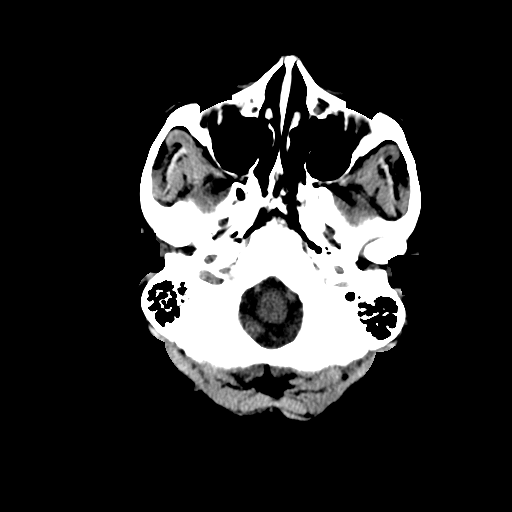
[im 3/30  bone]
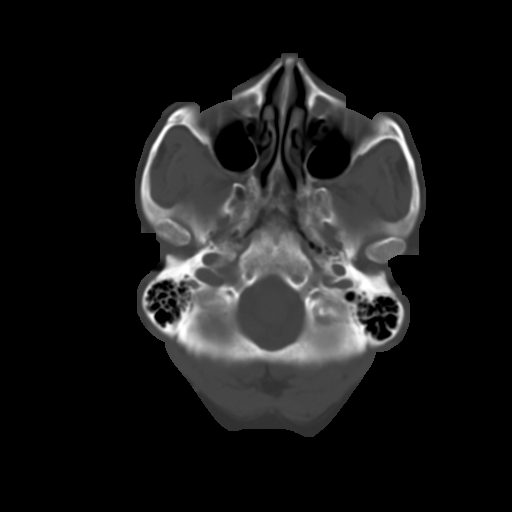
[im 5/30  brain]
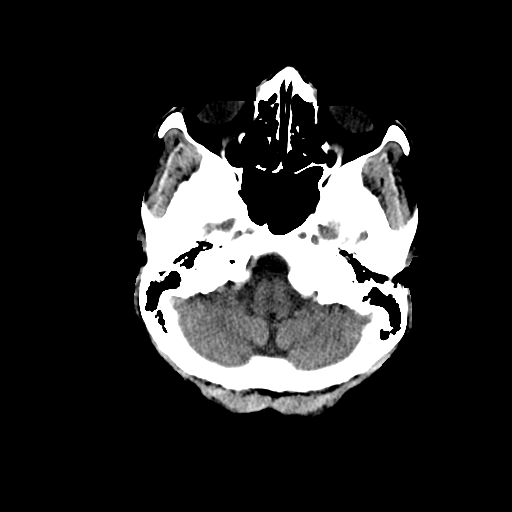
[im 7/30  brain]
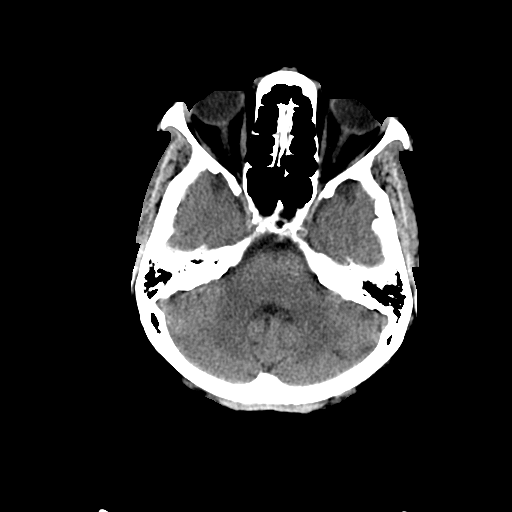
[im 9/30  brain]
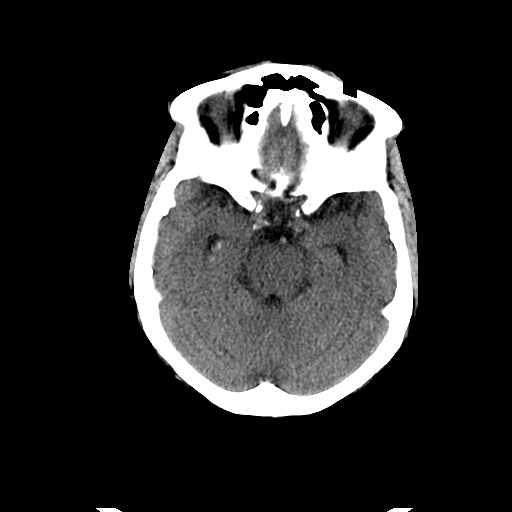
[im 11/30  brain]
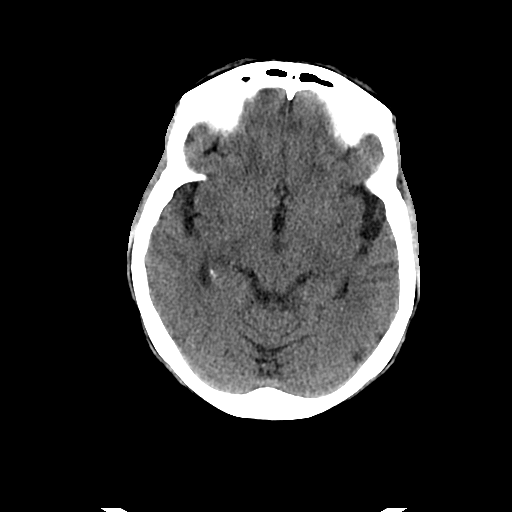
[im 11/30  bone]
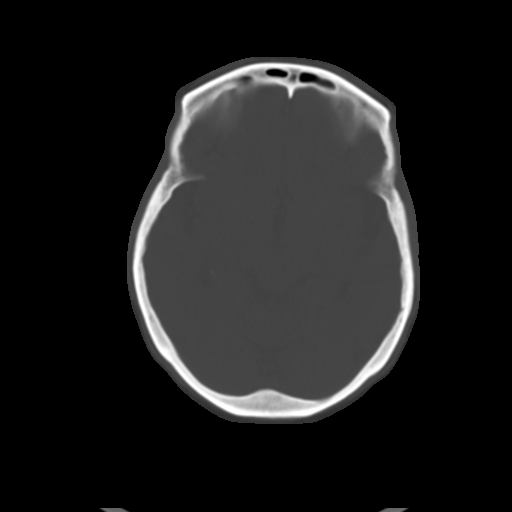
[im 13/30  brain]
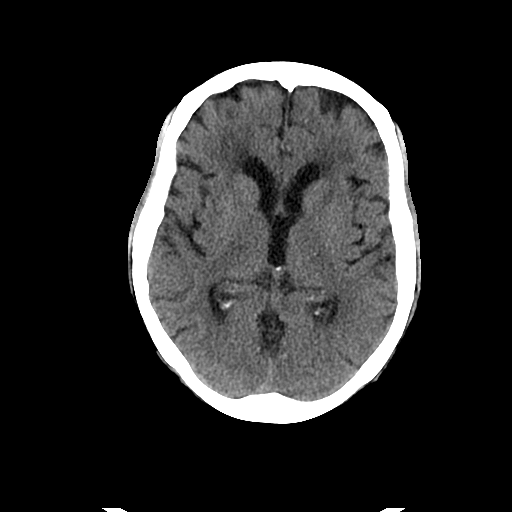
[im 15/30  brain]
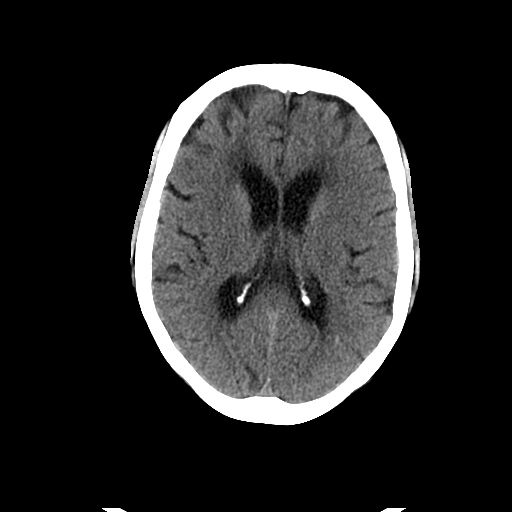
[im 17/30  brain]
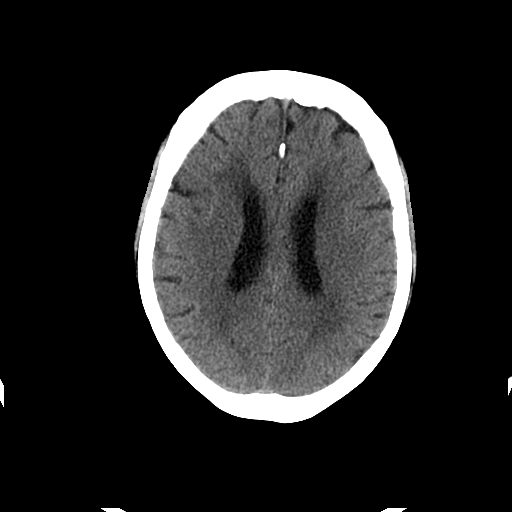
[im 19/30  brain]
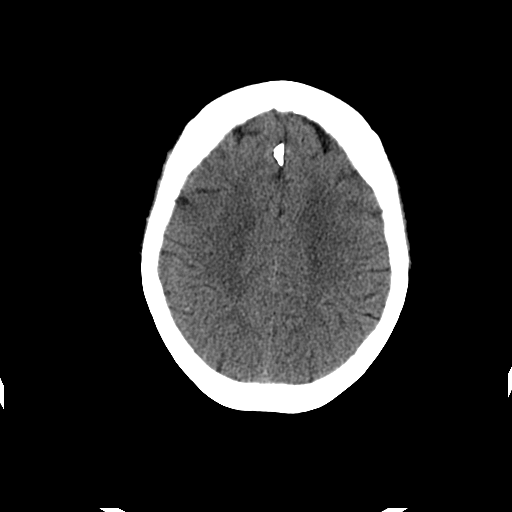
[im 19/30  bone]
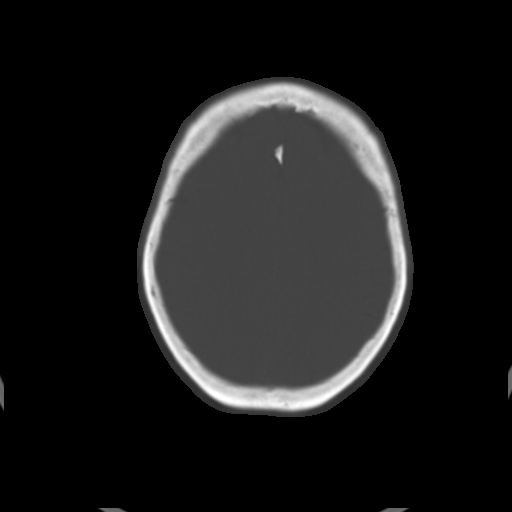
[im 21/30  brain]
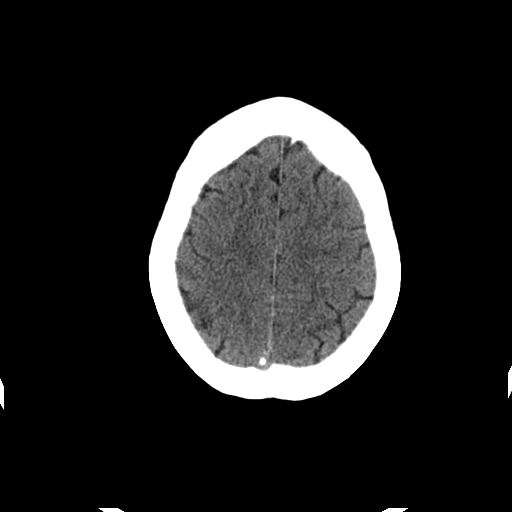
[im 23/30  brain]
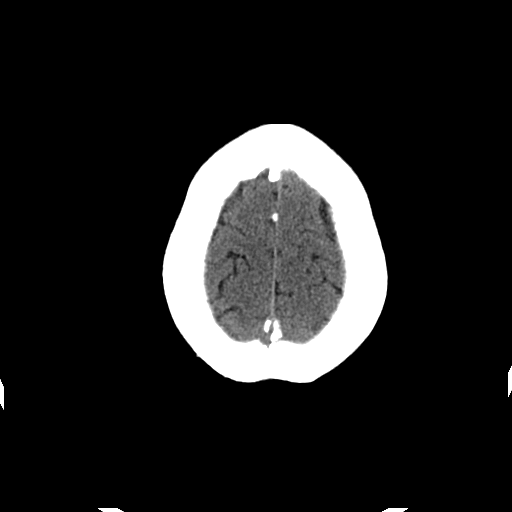
[im 25/30  brain]
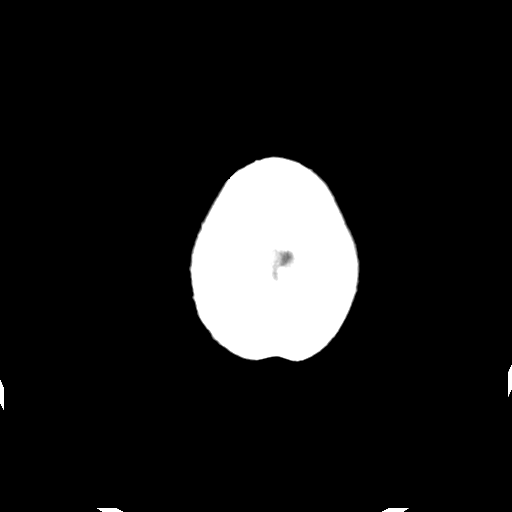
[im 27/30  brain]
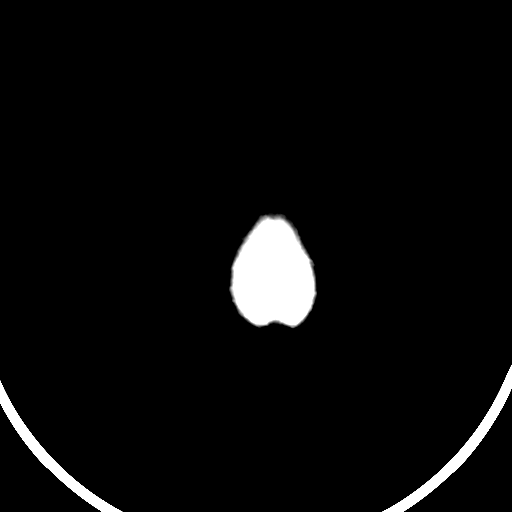
[im 27/30  bone]
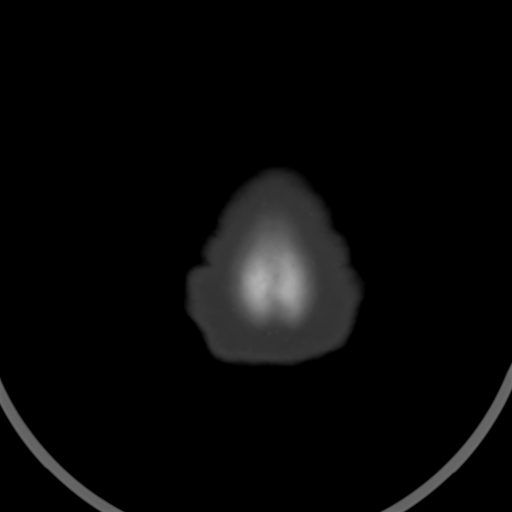

[Series 202: head w/o bone, idose (1) · axial · non-contrast · 0.47mm/px · z∈[+92,+112]mm · 2 of 30 slices shown]
[im 3/30  bone]
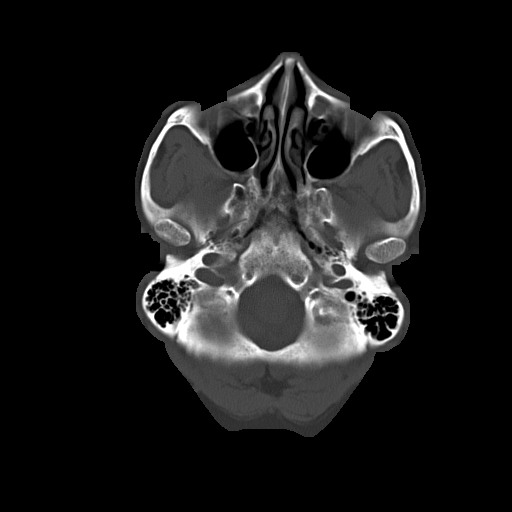
[im 7/30  bone]
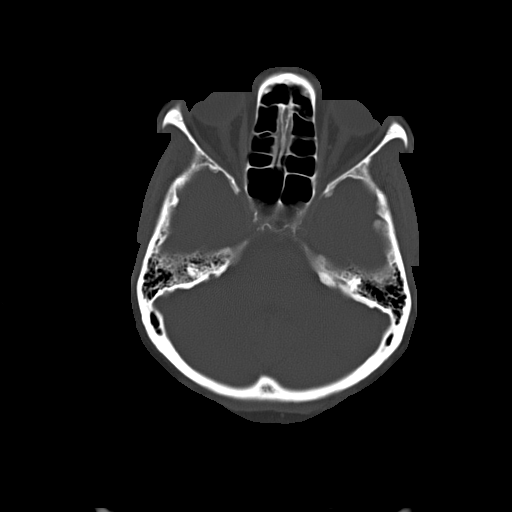

[15 of 30 positions shown; findings below may reference images not displayed]

FINDINGS: Visualized paranasal sinuses and mastoids are clear. Negative orbit
and scalp soft tissues. No acute osseous abnormality identified.

Calcified atherosclerosis at the skull base. Cerebral volume has
mildly diminished, remains within normal limits for age. No
ventriculomegaly. No midline shift, mass effect, or evidence of
intracranial mass lesion. Chronic left cerebellar lacunar infarct is
unchanged since 5177. No suspicious intracranial vascular
hyperdensity. Patchy cerebral white matter hypodensity has not
significantly changed. No cortically based acute infarct identified.
No acute intracranial hemorrhage identified.
IMPRESSION: 1.  No acute intracranial abnormality.
2. Chronic small vessel disease, stable since [DATE].

## 2016-01-31 ENCOUNTER — Other Ambulatory Visit: Payer: Self-pay | Admitting: Gastroenterology

## 2016-01-31 DIAGNOSIS — M25562 Pain in left knee: Secondary | ICD-10-CM | POA: Diagnosis not present

## 2016-02-01 ENCOUNTER — Other Ambulatory Visit: Payer: Self-pay

## 2016-02-01 NOTE — Telephone Encounter (Signed)
Joann Gutierrez patient

## 2016-02-02 ENCOUNTER — Encounter (HOSPITAL_COMMUNITY)
Admission: RE | Admit: 2016-02-02 | Discharge: 2016-02-02 | Disposition: A | Discharge status: Home / Self Care | Payer: Medicare Other | Source: Ambulatory Visit | Attending: Nephrology | Admitting: Nephrology

## 2016-02-02 DIAGNOSIS — N183 Chronic kidney disease, stage 3 unspecified: Secondary | ICD-10-CM

## 2016-02-02 DIAGNOSIS — D631 Anemia in chronic kidney disease: Secondary | ICD-10-CM | POA: Diagnosis not present

## 2016-02-02 LAB — POCT HEMOGLOBIN-HEMACUE: Hemoglobin: 10.3 g/dL — ABNORMAL LOW (ref 12.0–15.0)

## 2016-02-02 MED ORDER — EPOETIN ALFA 40000 UNIT/ML IJ SOLN: 30000.0000 [IU] | INTRAMUSCULAR | Status: DC

## 2016-02-02 MED ORDER — EPOETIN ALFA 20000 UNIT/ML IJ SOLN
INTRAMUSCULAR | Status: AC
  Administered 2016-02-02: 11:00:00 20000 [IU]
  Filled 2016-02-02: qty 1

## 2016-02-02 MED ORDER — EPOETIN ALFA 10000 UNIT/ML IJ SOLN
INTRAMUSCULAR | Status: AC
  Administered 2016-02-02: 10000 [IU]
  Filled 2016-02-02: qty 1

## 2016-02-06 ENCOUNTER — Ambulatory Visit: Payer: Medicare Other

## 2016-02-09 ENCOUNTER — Inpatient Hospital Stay (HOSPITAL_COMMUNITY): Admission: RE | Admit: 2016-02-09 | Payer: Medicare Other | Source: Ambulatory Visit

## 2016-02-16 ENCOUNTER — Encounter (HOSPITAL_COMMUNITY)
Admission: RE | Admit: 2016-02-16 | Discharge: 2016-02-16 | Disposition: A | Discharge status: Home / Self Care | Payer: Medicare Other | Source: Ambulatory Visit | Attending: Nephrology | Admitting: Nephrology

## 2016-02-16 DIAGNOSIS — N183 Chronic kidney disease, stage 3 unspecified: Secondary | ICD-10-CM

## 2016-02-16 DIAGNOSIS — D631 Anemia in chronic kidney disease: Secondary | ICD-10-CM | POA: Diagnosis not present

## 2016-02-16 LAB — RENAL FUNCTION PANEL
Albumin: 3.2 g/dL — ABNORMAL LOW (ref 3.5–5.0)
Anion gap: 9 (ref 5–15)
BUN: 31 mg/dL — AB (ref 6–20)
CHLORIDE: 106 mmol/L (ref 101–111)
CO2: 27 mmol/L (ref 22–32)
Calcium: 8.9 mg/dL (ref 8.9–10.3)
Creatinine, Ser: 1.95 mg/dL — ABNORMAL HIGH (ref 0.44–1.00)
GFR calc Af Amer: 29 mL/min — ABNORMAL LOW (ref >60)
GFR calc non Af Amer: 25 mL/min — ABNORMAL LOW (ref >60)
GLUCOSE: 101 mg/dL — AB (ref 65–99)
POTASSIUM: 3.9 mmol/L (ref 3.5–5.1)
Phosphorus: 3.9 mg/dL (ref 2.5–4.6)
Sodium: 142 mmol/L (ref 135–145)

## 2016-02-16 LAB — IRON AND TIBC
IRON: 66 ug/dL (ref 28–170)
SATURATION RATIOS: 32 % — AB (ref 10.4–31.8)
TIBC: 204 ug/dL — AB (ref 250–450)
UIBC: 138 ug/dL

## 2016-02-16 LAB — FERRITIN: FERRITIN: 318 ng/mL — AB (ref 11–307)

## 2016-02-16 LAB — POCT HEMOGLOBIN-HEMACUE: Hemoglobin: 10.8 g/dL — ABNORMAL LOW (ref 12.0–15.0)

## 2016-02-16 MED ORDER — EPOETIN ALFA 20000 UNIT/ML IJ SOLN
INTRAMUSCULAR | Status: AC
  Administered 2016-02-16: 20000 [IU] via SUBCUTANEOUS
  Filled 2016-02-16: qty 1

## 2016-02-16 MED ORDER — EPOETIN ALFA 40000 UNIT/ML IJ SOLN: 30000.0000 [IU] | INTRAMUSCULAR | Status: DC

## 2016-02-16 MED ORDER — EPOETIN ALFA 10000 UNIT/ML IJ SOLN
INTRAMUSCULAR | Status: AC
  Administered 2016-02-16: 10000 [IU] via SUBCUTANEOUS
  Filled 2016-02-16: qty 1

## 2016-02-17 LAB — PTH, INTACT AND CALCIUM
CALCIUM TOTAL (PTH): 8.8 mg/dL (ref 8.7–10.3)
PTH: 89 pg/mL — ABNORMAL HIGH (ref 15–65)

## 2016-02-23 ENCOUNTER — Encounter (HOSPITAL_COMMUNITY)
Admission: RE | Admit: 2016-02-23 | Discharge: 2016-02-23 | Disposition: A | Discharge status: Home / Self Care | Payer: Medicare Other | Source: Ambulatory Visit | Attending: Nephrology | Admitting: Nephrology

## 2016-02-23 DIAGNOSIS — N183 Chronic kidney disease, stage 3 unspecified: Secondary | ICD-10-CM

## 2016-02-23 DIAGNOSIS — D631 Anemia in chronic kidney disease: Secondary | ICD-10-CM | POA: Insufficient documentation

## 2016-02-23 LAB — POCT HEMOGLOBIN-HEMACUE: HEMOGLOBIN: 10.8 g/dL — AB (ref 12.0–15.0)

## 2016-02-23 MED ORDER — EPOETIN ALFA 20000 UNIT/ML IJ SOLN
INTRAMUSCULAR | Status: AC
  Administered 2016-02-23: 20000 [IU] via SUBCUTANEOUS
  Filled 2016-02-23: qty 1

## 2016-02-23 MED ORDER — EPOETIN ALFA 10000 UNIT/ML IJ SOLN
INTRAMUSCULAR | Status: AC
  Administered 2016-02-23: 10000 [IU] via SUBCUTANEOUS
  Filled 2016-02-23: qty 1

## 2016-02-23 MED ORDER — EPOETIN ALFA 40000 UNIT/ML IJ SOLN: 30000.0000 [IU] | INTRAMUSCULAR | Status: DC

## 2016-03-01 ENCOUNTER — Encounter (HOSPITAL_COMMUNITY)
Admission: RE | Admit: 2016-03-01 | Discharge: 2016-03-01 | Disposition: A | Discharge status: Home / Self Care | Payer: Medicare Other | Source: Ambulatory Visit | Attending: Nephrology | Admitting: Nephrology

## 2016-03-01 DIAGNOSIS — N183 Chronic kidney disease, stage 3 unspecified: Secondary | ICD-10-CM

## 2016-03-01 DIAGNOSIS — D631 Anemia in chronic kidney disease: Secondary | ICD-10-CM | POA: Diagnosis not present

## 2016-03-01 LAB — POCT HEMOGLOBIN-HEMACUE: HEMOGLOBIN: 10.7 g/dL — AB (ref 12.0–15.0)

## 2016-03-01 MED ORDER — EPOETIN ALFA 40000 UNIT/ML IJ SOLN: 30000.0000 [IU] | INTRAMUSCULAR | Status: DC

## 2016-03-01 MED ORDER — EPOETIN ALFA 20000 UNIT/ML IJ SOLN
INTRAMUSCULAR | Status: AC
  Administered 2016-03-01: 11:00:00 20000 [IU]
  Filled 2016-03-01: qty 1

## 2016-03-01 MED ORDER — EPOETIN ALFA 10000 UNIT/ML IJ SOLN
INTRAMUSCULAR | Status: AC
  Administered 2016-03-01: 10000 [IU]
  Filled 2016-03-01: qty 1

## 2016-03-04 ENCOUNTER — Other Ambulatory Visit: Payer: Self-pay | Admitting: Internal Medicine

## 2016-03-04 ENCOUNTER — Other Ambulatory Visit: Payer: Self-pay | Admitting: Pulmonary Disease

## 2016-03-08 ENCOUNTER — Encounter (HOSPITAL_COMMUNITY)
Admission: RE | Admit: 2016-03-08 | Discharge: 2016-03-08 | Disposition: A | Discharge status: Home / Self Care | Payer: Medicare Other | Source: Ambulatory Visit | Attending: Nephrology | Admitting: Nephrology

## 2016-03-08 DIAGNOSIS — N183 Chronic kidney disease, stage 3 unspecified: Secondary | ICD-10-CM

## 2016-03-08 DIAGNOSIS — D631 Anemia in chronic kidney disease: Secondary | ICD-10-CM | POA: Diagnosis not present

## 2016-03-08 LAB — POCT HEMOGLOBIN-HEMACUE: HEMOGLOBIN: 11.8 g/dL — AB (ref 12.0–15.0)

## 2016-03-08 MED ORDER — EPOETIN ALFA 40000 UNIT/ML IJ SOLN: 30000.0000 [IU] | INTRAMUSCULAR | Status: DC

## 2016-03-08 MED ORDER — EPOETIN ALFA 10000 UNIT/ML IJ SOLN
INTRAMUSCULAR | Status: AC
  Administered 2016-03-08: 11:00:00 10000 [IU] via SUBCUTANEOUS
  Filled 2016-03-08: qty 1

## 2016-03-08 MED ORDER — EPOETIN ALFA 20000 UNIT/ML IJ SOLN
INTRAMUSCULAR | Status: AC
  Administered 2016-03-08: 11:00:00 20000 [IU] via SUBCUTANEOUS
  Filled 2016-03-08: qty 1

## 2016-03-11 DIAGNOSIS — Z6841 Body Mass Index (BMI) 40.0 and over, adult: Secondary | ICD-10-CM | POA: Diagnosis not present

## 2016-03-11 DIAGNOSIS — Z124 Encounter for screening for malignant neoplasm of cervix: Secondary | ICD-10-CM | POA: Diagnosis not present

## 2016-03-11 DIAGNOSIS — Z1231 Encounter for screening mammogram for malignant neoplasm of breast: Secondary | ICD-10-CM | POA: Diagnosis not present

## 2016-03-13 DIAGNOSIS — N183 Chronic kidney disease, stage 3 (moderate): Secondary | ICD-10-CM | POA: Diagnosis not present

## 2016-03-13 DIAGNOSIS — I10 Essential (primary) hypertension: Secondary | ICD-10-CM | POA: Diagnosis not present

## 2016-03-13 DIAGNOSIS — E1121 Type 2 diabetes mellitus with diabetic nephropathy: Secondary | ICD-10-CM | POA: Diagnosis not present

## 2016-03-13 DIAGNOSIS — D638 Anemia in other chronic diseases classified elsewhere: Secondary | ICD-10-CM | POA: Diagnosis not present

## 2016-03-13 DIAGNOSIS — Z794 Long term (current) use of insulin: Secondary | ICD-10-CM | POA: Diagnosis not present

## 2016-03-13 DIAGNOSIS — E7800 Pure hypercholesterolemia, unspecified: Secondary | ICD-10-CM | POA: Diagnosis not present

## 2016-03-13 DIAGNOSIS — E1322 Other specified diabetes mellitus with diabetic chronic kidney disease: Secondary | ICD-10-CM | POA: Diagnosis not present

## 2016-03-14 DIAGNOSIS — I10 Essential (primary) hypertension: Secondary | ICD-10-CM | POA: Diagnosis not present

## 2016-03-14 DIAGNOSIS — M9080 Osteopathy in diseases classified elsewhere, unspecified site: Secondary | ICD-10-CM | POA: Diagnosis not present

## 2016-03-14 DIAGNOSIS — N184 Chronic kidney disease, stage 4 (severe): Secondary | ICD-10-CM | POA: Diagnosis not present

## 2016-03-14 DIAGNOSIS — E889 Metabolic disorder, unspecified: Secondary | ICD-10-CM | POA: Diagnosis not present

## 2016-03-14 DIAGNOSIS — D638 Anemia in other chronic diseases classified elsewhere: Secondary | ICD-10-CM | POA: Diagnosis not present

## 2016-03-15 ENCOUNTER — Encounter (HOSPITAL_COMMUNITY)
Admission: RE | Admit: 2016-03-15 | Discharge: 2016-03-15 | Disposition: A | Discharge status: Home / Self Care | Payer: Medicare Other | Source: Ambulatory Visit | Attending: Nephrology | Admitting: Nephrology

## 2016-03-15 DIAGNOSIS — N183 Chronic kidney disease, stage 3 unspecified: Secondary | ICD-10-CM

## 2016-03-15 DIAGNOSIS — D631 Anemia in chronic kidney disease: Secondary | ICD-10-CM | POA: Diagnosis not present

## 2016-03-15 LAB — IRON AND TIBC
Iron: 47 ug/dL (ref 28–170)
SATURATION RATIOS: 23 % (ref 10.4–31.8)
TIBC: 204 ug/dL — AB (ref 250–450)
UIBC: 157 ug/dL

## 2016-03-15 LAB — RENAL FUNCTION PANEL
ALBUMIN: 3.5 g/dL (ref 3.5–5.0)
ANION GAP: 12 (ref 5–15)
BUN: 33 mg/dL — ABNORMAL HIGH (ref 6–20)
CALCIUM: 8.9 mg/dL (ref 8.9–10.3)
CO2: 28 mmol/L (ref 22–32)
Chloride: 101 mmol/L (ref 101–111)
Creatinine, Ser: 2.45 mg/dL — ABNORMAL HIGH (ref 0.44–1.00)
GFR calc non Af Amer: 19 mL/min — ABNORMAL LOW (ref >60)
GFR, EST AFRICAN AMERICAN: 22 mL/min — AB (ref >60)
GLUCOSE: 160 mg/dL — AB (ref 65–99)
PHOSPHORUS: 5.3 mg/dL — AB (ref 2.5–4.6)
POTASSIUM: 4.1 mmol/L (ref 3.5–5.1)
SODIUM: 141 mmol/L (ref 135–145)

## 2016-03-15 LAB — FERRITIN: Ferritin: 216 ng/mL (ref 11–307)

## 2016-03-15 LAB — POCT HEMOGLOBIN-HEMACUE: Hemoglobin: 10.8 g/dL — ABNORMAL LOW (ref 12.0–15.0)

## 2016-03-15 MED ORDER — EPOETIN ALFA 10000 UNIT/ML IJ SOLN
INTRAMUSCULAR | Status: AC
  Administered 2016-03-15: 10000 [IU]
  Filled 2016-03-15: qty 1

## 2016-03-15 MED ORDER — EPOETIN ALFA 40000 UNIT/ML IJ SOLN: 30000.0000 [IU] | INTRAMUSCULAR | Status: DC

## 2016-03-15 MED ORDER — EPOETIN ALFA 20000 UNIT/ML IJ SOLN
INTRAMUSCULAR | Status: AC
  Administered 2016-03-15: 11:00:00 20000 [IU]
  Filled 2016-03-15: qty 1

## 2016-03-16 LAB — PTH, INTACT AND CALCIUM
CALCIUM TOTAL (PTH): 8.7 mg/dL (ref 8.7–10.3)
PTH: 140 pg/mL — AB (ref 15–65)

## 2016-03-21 ENCOUNTER — Other Ambulatory Visit (HOSPITAL_COMMUNITY): Payer: Self-pay | Admitting: *Deleted

## 2016-03-22 ENCOUNTER — Encounter (HOSPITAL_COMMUNITY)
Admission: RE | Admit: 2016-03-22 | Discharge: 2016-03-22 | Disposition: A | Discharge status: Home / Self Care | Payer: Medicare Other | Source: Ambulatory Visit | Attending: Nephrology | Admitting: Nephrology

## 2016-03-22 DIAGNOSIS — D631 Anemia in chronic kidney disease: Secondary | ICD-10-CM | POA: Diagnosis not present

## 2016-03-22 DIAGNOSIS — N183 Chronic kidney disease, stage 3 unspecified: Secondary | ICD-10-CM

## 2016-03-22 LAB — POCT HEMOGLOBIN-HEMACUE: Hemoglobin: 13 g/dL (ref 12.0–15.0)

## 2016-03-22 MED ORDER — EPOETIN ALFA 40000 UNIT/ML IJ SOLN
30000.0000 [IU] | INTRAMUSCULAR | Status: DC
Start: 2016-03-22 — End: 2016-03-23

## 2016-03-22 MED ORDER — EPOETIN ALFA 20000 UNIT/ML IJ SOLN
INTRAMUSCULAR | Status: AC
  Filled 2016-03-22: qty 1

## 2016-03-22 MED ORDER — EPOETIN ALFA 10000 UNIT/ML IJ SOLN
INTRAMUSCULAR | Status: AC
  Filled 2016-03-22: qty 1

## 2016-03-26 ENCOUNTER — Telehealth: Payer: Self-pay | Admitting: *Deleted

## 2016-03-26 NOTE — Telephone Encounter (Signed)
Refill request from [facility] for 90 day supply of gabapentin. Patient does not have a follow up appointment; please advise. [name]

## 2016-03-27 ENCOUNTER — Other Ambulatory Visit: Payer: Self-pay | Admitting: Pulmonary Disease

## 2016-03-27 ENCOUNTER — Other Ambulatory Visit: Payer: Self-pay | Admitting: *Deleted

## 2016-03-27 MED ORDER — ALBUTEROL SULFATE HFA 108 (90 BASE) MCG/ACT IN AERS: INHALATION_SPRAY | RESPIRATORY_TRACT | 0 refills | Status: DC

## 2016-03-27 MED ORDER — NITROGLYCERIN 0.4 MG SL SUBL: SUBLINGUAL_TABLET | SUBLINGUAL | 2 refills | Status: DC

## 2016-03-27 NOTE — Telephone Encounter (Signed)
Rx has been sent to the pharmacy electronically. ° °

## 2016-03-29 ENCOUNTER — Encounter (HOSPITAL_COMMUNITY): Payer: Medicare Other

## 2016-04-05 ENCOUNTER — Encounter (HOSPITAL_COMMUNITY)
Admission: RE | Admit: 2016-04-05 | Discharge: 2016-04-05 | Disposition: A | Discharge status: Home / Self Care | Payer: Medicare Other | Source: Ambulatory Visit | Attending: Nephrology | Admitting: Nephrology

## 2016-04-05 DIAGNOSIS — D631 Anemia in chronic kidney disease: Secondary | ICD-10-CM | POA: Diagnosis not present

## 2016-04-05 DIAGNOSIS — N183 Chronic kidney disease, stage 3 unspecified: Secondary | ICD-10-CM

## 2016-04-05 LAB — POCT HEMOGLOBIN-HEMACUE: HEMOGLOBIN: 11.8 g/dL — AB (ref 12.0–15.0)

## 2016-04-05 MED ORDER — EPOETIN ALFA 40000 UNIT/ML IJ SOLN: 30000.0000 [IU] | INTRAMUSCULAR | Status: DC

## 2016-04-05 MED ORDER — EPOETIN ALFA 20000 UNIT/ML IJ SOLN
INTRAMUSCULAR | Status: AC
  Administered 2016-04-05: 11:00:00 20000 [IU] via SUBCUTANEOUS
  Filled 2016-04-05: qty 1

## 2016-04-05 MED ORDER — EPOETIN ALFA 10000 UNIT/ML IJ SOLN
INTRAMUSCULAR | Status: AC
  Administered 2016-04-05: 10000 [IU] via SUBCUTANEOUS
  Filled 2016-04-05: qty 1

## 2016-04-08 NOTE — Telephone Encounter (Signed)
Please ask [facility] to call her PCP, Dr. Seward Carol.

## 2016-04-08 NOTE — Telephone Encounter (Signed)
Pharmacy notified.

## 2016-04-12 ENCOUNTER — Encounter (HOSPITAL_COMMUNITY)
Admission: RE | Admit: 2016-04-12 | Discharge: 2016-04-12 | Disposition: A | Discharge status: Home / Self Care | Payer: Medicare Other | Source: Ambulatory Visit | Attending: Nephrology | Admitting: Nephrology

## 2016-04-12 DIAGNOSIS — N183 Chronic kidney disease, stage 3 unspecified: Secondary | ICD-10-CM

## 2016-04-12 DIAGNOSIS — D631 Anemia in chronic kidney disease: Secondary | ICD-10-CM | POA: Diagnosis not present

## 2016-04-12 LAB — RENAL FUNCTION PANEL
Albumin: 3.4 g/dL — ABNORMAL LOW (ref 3.5–5.0)
Anion gap: 12 (ref 5–15)
BUN: 50 mg/dL — ABNORMAL HIGH (ref 6–20)
CHLORIDE: 100 mmol/L — AB (ref 101–111)
CO2: 29 mmol/L (ref 22–32)
Calcium: 8.9 mg/dL (ref 8.9–10.3)
Creatinine, Ser: 1.97 mg/dL — ABNORMAL HIGH (ref 0.44–1.00)
GFR calc Af Amer: 28 mL/min — ABNORMAL LOW (ref >60)
GFR, EST NON AFRICAN AMERICAN: 25 mL/min — AB (ref >60)
GLUCOSE: 171 mg/dL — AB (ref 65–99)
POTASSIUM: 3.2 mmol/L — AB (ref 3.5–5.1)
Phosphorus: 4.3 mg/dL (ref 2.5–4.6)
Sodium: 141 mmol/L (ref 135–145)

## 2016-04-12 LAB — IRON AND TIBC
Iron: 48 ug/dL (ref 28–170)
SATURATION RATIOS: 23 % (ref 10.4–31.8)
TIBC: 211 ug/dL — ABNORMAL LOW (ref 250–450)
UIBC: 163 ug/dL

## 2016-04-12 LAB — POCT HEMOGLOBIN-HEMACUE: Hemoglobin: 12.6 g/dL (ref 12.0–15.0)

## 2016-04-12 LAB — FERRITIN: Ferritin: 250 ng/mL (ref 11–307)

## 2016-04-12 MED ORDER — EPOETIN ALFA 40000 UNIT/ML IJ SOLN: 30000.0000 [IU] | INTRAMUSCULAR | Status: DC

## 2016-04-13 LAB — PTH, INTACT AND CALCIUM
Calcium, Total (PTH): 9.2 mg/dL (ref 8.7–10.3)
PTH: 103 pg/mL — ABNORMAL HIGH (ref 15–65)

## 2016-04-15 ENCOUNTER — Ambulatory Visit (INDEPENDENT_AMBULATORY_CARE_PROVIDER_SITE_OTHER): Payer: Medicare Other | Admitting: Pulmonary Disease

## 2016-04-15 ENCOUNTER — Encounter: Payer: Self-pay | Admitting: Pulmonary Disease

## 2016-04-15 VITALS — BP 142/92 | HR 66 | Ht 64.5 in | Wt 246.6 lb

## 2016-04-15 DIAGNOSIS — R0609 Other forms of dyspnea: Secondary | ICD-10-CM

## 2016-04-15 DIAGNOSIS — J449 Chronic obstructive pulmonary disease, unspecified: Secondary | ICD-10-CM | POA: Diagnosis not present

## 2016-04-15 DIAGNOSIS — G4733 Obstructive sleep apnea (adult) (pediatric): Secondary | ICD-10-CM

## 2016-04-15 DIAGNOSIS — R05 Cough: Secondary | ICD-10-CM

## 2016-04-15 DIAGNOSIS — J4489 Other specified chronic obstructive pulmonary disease: Secondary | ICD-10-CM

## 2016-04-15 DIAGNOSIS — R058 Other specified cough: Secondary | ICD-10-CM

## 2016-04-15 NOTE — Progress Notes (Signed)
Chief Complaint  Patient presents with  . Follow-up    Pt c/o increased SOB, cough and wheezing. Pt states that she thought she was coming down with a cold but feels its may be allergy related. Pt has not been wearing CPAP maching d/t having celluitis on her face x 5 months.     Sleep tests PSG 04/28/09 >> AHI 11, SpO2 low 68% ONO with CPAP and room air 10/22/12 >> Test time 10 hrs 59 min. Mean SpO2 92.4%, low SpO2 75%. Spent 16 min with SpO2 < 89%. CPAP 09/06/15 to 12/04/15 >> used on 17 of 90 nights with average 8 hrs 13 min.  Average AHI 0.7 with CPAP 9 cm H2O  Pulmonary tests Spirometry 07/05/10 >> FEV1 1.30 (64%), FEV1% 80 >> coughing during test RAST 12/11/12 >> negative, IgE 63.3 PFT 03/17/13 >> FEV1 1.27 (67%), FEV1% 84, TLC 4.06 (80%), DLCO 57%, borderline BD  Cardiac tests Lt, Rt Heart cath 03/03/13 >> RA 10, RV 38/7, PA 41/16 28, PCWP 13, LV 142/5, AO 137/76 Echo 05/23/15 >> EF 60 to 65%, mod LVH, grade 1 DD, mod TR, PAS 59 mmHg  Past medical history Depression, DM, HTN, Diastolic dysfunction, HLD, CAD, CVA, GERD, Esophageal stricture, Gastroparesis, Hiatal hernia, Sickle cell trait, CKD, Shingles with trigeminal neuralgia 2017  Past surgical history, Family history, Social history, Allergies reviewed  Vital Signs BP (!) 142/92 (BP Location: Left Arm, Cuff Size: Normal)   Pulse 66   Ht 5' 4.5" (1.638 m)   Wt 246 lb 9.6 oz (111.9 kg)   SpO2 96%   BMI 41.68 kg/m   History of Present Illness: Joann Gutierrez is a 71 y.o. 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  She developed shingles last fall and this caused neuropathic pain in her Lt trigeminal nerve region.  Her face is still very sensitive.  As a result she has trouble using CPAP.  She tries using oxygen, but this can also cause discomfort.  She is not having cough, wheeze, sinus congestion, sore throat, chest pain, or swelling.  She gets winded when walking, but  then improves after resting for a few minutes.   Physical Exam:  General - pleasant Eyes - wears glasses, pupils reactive ENT - no sinus tenderness, no oral exudate, no LAN Cardiac - regular, no murmur Chest - no wheeze Back - no tenderness Abd - soft, non tender Ext - no edema Neuro - normal strength Skin - no rashes Psych - normal mood   Assessment/Plan:  Upper airway cough syndrome with post nasal drip. - stable - prn flonase  COPD with asthma. 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 - continue 2 liters oxygen at night  Obstructive sleep apnea. - she has been limited in use of CPAP due to neuropathic pain from Shingles and trigeminal nerve involvement  Dyspnea on exertion. - most likely related to deconditioning - encouraged her to maintain exercise regimen  Obesity. - discussed importance of weight loss   Patient Instructions  Follow up in 6 months   [name], MD [facility] Pager:  [phone] 04/15/2016, 12:04 PM

## 2016-04-15 NOTE — Patient Instructions (Signed)
Follow up in 6 months 

## 2016-04-19 ENCOUNTER — Encounter (HOSPITAL_COMMUNITY): Payer: Medicare Other

## 2016-04-23 ENCOUNTER — Telehealth: Payer: Self-pay | Admitting: *Deleted

## 2016-04-23 NOTE — Telephone Encounter (Signed)
Patient continues to have ear, nose and eye soreness.  Recommended that patient call PCP, [name].

## 2016-04-26 ENCOUNTER — Encounter (HOSPITAL_COMMUNITY)
Admission: RE | Admit: 2016-04-26 | Discharge: 2016-04-26 | Disposition: A | Discharge status: Home / Self Care | Payer: Medicare Other | Source: Ambulatory Visit | Attending: Nephrology | Admitting: Nephrology

## 2016-04-26 DIAGNOSIS — D631 Anemia in chronic kidney disease: Secondary | ICD-10-CM | POA: Diagnosis not present

## 2016-04-26 DIAGNOSIS — N183 Chronic kidney disease, stage 3 unspecified: Secondary | ICD-10-CM

## 2016-04-26 LAB — POCT HEMOGLOBIN-HEMACUE: HEMOGLOBIN: 10.1 g/dL — AB (ref 12.0–15.0)

## 2016-04-26 MED ORDER — EPOETIN ALFA 10000 UNIT/ML IJ SOLN
INTRAMUSCULAR | Status: AC
  Administered 2016-04-26: 11:00:00 10000 [IU]
  Filled 2016-04-26: qty 1

## 2016-04-26 MED ORDER — EPOETIN ALFA 40000 UNIT/ML IJ SOLN: 30000.0000 [IU] | INTRAMUSCULAR | Status: DC

## 2016-04-26 MED ORDER — EPOETIN ALFA 20000 UNIT/ML IJ SOLN
INTRAMUSCULAR | Status: AC
  Administered 2016-04-26: 11:00:00 20000 [IU]
  Filled 2016-04-26: qty 1

## 2016-05-02 ENCOUNTER — Encounter: Payer: Self-pay | Admitting: Podiatry

## 2016-05-02 ENCOUNTER — Ambulatory Visit (INDEPENDENT_AMBULATORY_CARE_PROVIDER_SITE_OTHER): Payer: Medicare Other | Admitting: Podiatry

## 2016-05-02 DIAGNOSIS — M79671 Pain in right foot: Secondary | ICD-10-CM | POA: Diagnosis not present

## 2016-05-02 DIAGNOSIS — M79672 Pain in left foot: Secondary | ICD-10-CM | POA: Diagnosis not present

## 2016-05-02 DIAGNOSIS — M79606 Pain in leg, unspecified: Secondary | ICD-10-CM

## 2016-05-02 DIAGNOSIS — B351 Tinea unguium: Secondary | ICD-10-CM | POA: Diagnosis not present

## 2016-05-02 NOTE — Progress Notes (Signed)
Subjective: 71 y.o. year old IDDM female patient presents requesting toe nails trimmed. Toes hurt with thick nails.  Stated that her blood sugar is under control. Uses cane to walk.   Objective: Dermatologic:  Hypertrophic nails x 10. Deformed nails with fungal debris both great toe nails. Normal color and no broken skin on injured toe left hallux.  Vascular: All pedal pulses are palpable bilateral.  Enlarged ankle joint without active edema. Orthopedic: Mild hallux valgus with bunion on left foot.  Excess first ray sagittal plane motion R>L.  Tight achilles tendon on both feet. Not symptomatic. Neurologic: All epicritic and tactile sensations grossly intact.   Assessment: Dystrophic mycotic nails x 10.  IDDM, under control.  Painful nails.   Treatment: All mycotic nails debrided.  Return in 3 months or as needed

## 2016-05-02 NOTE — Patient Instructions (Signed)
Seen for hypertrophic nails. All nails debrided. Return in 3 months or as needed.  

## 2016-05-03 ENCOUNTER — Encounter (HOSPITAL_COMMUNITY)
Admission: RE | Admit: 2016-05-03 | Discharge: 2016-05-03 | Disposition: A | Discharge status: Home / Self Care | Payer: Medicare Other | Source: Ambulatory Visit | Attending: Nephrology | Admitting: Nephrology

## 2016-05-03 DIAGNOSIS — N183 Chronic kidney disease, stage 3 unspecified: Secondary | ICD-10-CM

## 2016-05-03 DIAGNOSIS — D631 Anemia in chronic kidney disease: Secondary | ICD-10-CM | POA: Diagnosis not present

## 2016-05-03 LAB — POCT HEMOGLOBIN-HEMACUE: HEMOGLOBIN: 10.2 g/dL — AB (ref 12.0–15.0)

## 2016-05-03 MED ORDER — EPOETIN ALFA 10000 UNIT/ML IJ SOLN
INTRAMUSCULAR | Status: AC
  Administered 2016-05-03: 11:00:00 10000 [IU]
  Filled 2016-05-03: qty 1

## 2016-05-03 MED ORDER — EPOETIN ALFA 20000 UNIT/ML IJ SOLN
INTRAMUSCULAR | Status: AC
  Administered 2016-05-03: 20000 [IU]
  Filled 2016-05-03: qty 1

## 2016-05-03 MED ORDER — EPOETIN ALFA 40000 UNIT/ML IJ SOLN: 30000.0000 [IU] | INTRAMUSCULAR | Status: DC

## 2016-05-09 DIAGNOSIS — H35033 Hypertensive retinopathy, bilateral: Secondary | ICD-10-CM | POA: Diagnosis not present

## 2016-05-09 DIAGNOSIS — H40033 Anatomical narrow angle, bilateral: Secondary | ICD-10-CM | POA: Diagnosis not present

## 2016-05-09 DIAGNOSIS — H35363 Drusen (degenerative) of macula, bilateral: Secondary | ICD-10-CM | POA: Diagnosis not present

## 2016-05-09 DIAGNOSIS — E119 Type 2 diabetes mellitus without complications: Secondary | ICD-10-CM | POA: Diagnosis not present

## 2016-05-09 DIAGNOSIS — H2513 Age-related nuclear cataract, bilateral: Secondary | ICD-10-CM | POA: Diagnosis not present

## 2016-05-09 DIAGNOSIS — H25013 Cortical age-related cataract, bilateral: Secondary | ICD-10-CM | POA: Diagnosis not present

## 2016-05-09 DIAGNOSIS — H35373 Puckering of macula, bilateral: Secondary | ICD-10-CM | POA: Diagnosis not present

## 2016-05-10 ENCOUNTER — Encounter (HOSPITAL_COMMUNITY)
Admission: RE | Admit: 2016-05-10 | Discharge: 2016-05-10 | Disposition: A | Discharge status: Home / Self Care | Payer: Medicare Other | Source: Ambulatory Visit | Attending: Nephrology | Admitting: Nephrology

## 2016-05-10 DIAGNOSIS — N183 Chronic kidney disease, stage 3 unspecified: Secondary | ICD-10-CM

## 2016-05-10 DIAGNOSIS — D631 Anemia in chronic kidney disease: Secondary | ICD-10-CM | POA: Diagnosis not present

## 2016-05-10 LAB — RENAL FUNCTION PANEL
ALBUMIN: 3.5 g/dL (ref 3.5–5.0)
Anion gap: 8 (ref 5–15)
BUN: 51 mg/dL — AB (ref 6–20)
CHLORIDE: 108 mmol/L (ref 101–111)
CO2: 28 mmol/L (ref 22–32)
Calcium: 9 mg/dL (ref 8.9–10.3)
Creatinine, Ser: 1.71 mg/dL — ABNORMAL HIGH (ref 0.44–1.00)
GFR calc Af Amer: 34 mL/min — ABNORMAL LOW (ref >60)
GFR calc non Af Amer: 29 mL/min — ABNORMAL LOW (ref >60)
Glucose, Bld: 81 mg/dL (ref 65–99)
PHOSPHORUS: 4 mg/dL (ref 2.5–4.6)
POTASSIUM: 3.8 mmol/L (ref 3.5–5.1)
Sodium: 144 mmol/L (ref 135–145)

## 2016-05-10 LAB — IRON AND TIBC
Iron: 50 ug/dL (ref 28–170)
SATURATION RATIOS: 22 % (ref 10.4–31.8)
TIBC: 224 ug/dL — ABNORMAL LOW (ref 250–450)
UIBC: 174 ug/dL

## 2016-05-10 LAB — FERRITIN: Ferritin: 207 ng/mL (ref 11–307)

## 2016-05-10 LAB — POCT HEMOGLOBIN-HEMACUE: Hemoglobin: 11.4 g/dL — ABNORMAL LOW (ref 12.0–15.0)

## 2016-05-10 MED ORDER — EPOETIN ALFA 40000 UNIT/ML IJ SOLN: 30000.0000 [IU] | INTRAMUSCULAR | Status: DC

## 2016-05-10 MED ORDER — EPOETIN ALFA 20000 UNIT/ML IJ SOLN
INTRAMUSCULAR | Status: AC
  Administered 2016-05-10: 20000 [IU] via SUBCUTANEOUS
  Filled 2016-05-10: qty 1

## 2016-05-10 MED ORDER — EPOETIN ALFA 10000 UNIT/ML IJ SOLN
INTRAMUSCULAR | Status: AC
  Administered 2016-05-10: 10000 [IU] via SUBCUTANEOUS
  Filled 2016-05-10: qty 1

## 2016-05-11 LAB — PTH, INTACT AND CALCIUM
Calcium, Total (PTH): 9.2 mg/dL (ref 8.7–10.3)
PTH: 114 pg/mL — AB (ref 15–65)

## 2016-05-16 ENCOUNTER — Telehealth: Payer: Self-pay

## 2016-05-16 NOTE — Telephone Encounter (Signed)
Voicemail:  Patient calling : she seems confused as to who she needs to see. She thought Dr Megan Salon was the one to call since he treated her shingles and cellulitits once before.   I called and left message on her voicemail to seek care with her primary care physician and if ID is needed they will make the proper referral or call us back with more detailed information.   Laverle Patter, RN

## 2016-05-17 ENCOUNTER — Encounter (HOSPITAL_COMMUNITY)
Admission: RE | Admit: 2016-05-17 | Discharge: 2016-05-17 | Disposition: A | Discharge status: Home / Self Care | Payer: Medicare Other | Source: Ambulatory Visit | Attending: Nephrology | Admitting: Nephrology

## 2016-05-17 DIAGNOSIS — N183 Chronic kidney disease, stage 3 unspecified: Secondary | ICD-10-CM

## 2016-05-17 DIAGNOSIS — D631 Anemia in chronic kidney disease: Secondary | ICD-10-CM | POA: Diagnosis not present

## 2016-05-17 LAB — POCT HEMOGLOBIN-HEMACUE: Hemoglobin: 10.7 g/dL — ABNORMAL LOW (ref 12.0–15.0)

## 2016-05-17 MED ORDER — EPOETIN ALFA 10000 UNIT/ML IJ SOLN
INTRAMUSCULAR | Status: AC
  Administered 2016-05-17: 10000 [IU] via SUBCUTANEOUS
  Filled 2016-05-17: qty 1

## 2016-05-17 MED ORDER — EPOETIN ALFA 20000 UNIT/ML IJ SOLN
INTRAMUSCULAR | Status: AC
  Administered 2016-05-17: 20000 [IU] via SUBCUTANEOUS
  Filled 2016-05-17: qty 1

## 2016-05-17 MED ORDER — EPOETIN ALFA 40000 UNIT/ML IJ SOLN: 30000.0000 [IU] | INTRAMUSCULAR | Status: DC

## 2016-05-20 DIAGNOSIS — B0229 Other postherpetic nervous system involvement: Secondary | ICD-10-CM | POA: Diagnosis not present

## 2016-05-20 DIAGNOSIS — H9190 Unspecified hearing loss, unspecified ear: Secondary | ICD-10-CM | POA: Diagnosis not present

## 2016-05-24 ENCOUNTER — Ambulatory Visit (HOSPITAL_COMMUNITY)
Admission: RE | Admit: 2016-05-24 | Discharge: 2016-05-24 | Disposition: A | Discharge status: Home / Self Care | Payer: Medicare Other | Source: Ambulatory Visit | Attending: Nephrology | Admitting: Nephrology

## 2016-05-24 DIAGNOSIS — D631 Anemia in chronic kidney disease: Secondary | ICD-10-CM | POA: Insufficient documentation

## 2016-05-24 DIAGNOSIS — N183 Chronic kidney disease, stage 3 unspecified: Secondary | ICD-10-CM

## 2016-05-24 LAB — POCT HEMOGLOBIN-HEMACUE: Hemoglobin: 11.9 g/dL — ABNORMAL LOW (ref 12.0–15.0)

## 2016-05-24 MED ORDER — EPOETIN ALFA 40000 UNIT/ML IJ SOLN: 30000.0000 [IU] | INTRAMUSCULAR | Status: DC

## 2016-05-24 MED ORDER — EPOETIN ALFA 10000 UNIT/ML IJ SOLN
INTRAMUSCULAR | Status: AC
  Administered 2016-05-24: 10000 [IU] via SUBCUTANEOUS
  Filled 2016-05-24: qty 1

## 2016-05-24 MED ORDER — EPOETIN ALFA 20000 UNIT/ML IJ SOLN
INTRAMUSCULAR | Status: AC
  Administered 2016-05-24: 20000 [IU] via SUBCUTANEOUS
  Filled 2016-05-24: qty 1

## 2016-05-31 ENCOUNTER — Encounter (HOSPITAL_COMMUNITY)
Admission: RE | Admit: 2016-05-31 | Discharge: 2016-05-31 | Disposition: A | Discharge status: Home / Self Care | Payer: Medicare Other | Source: Ambulatory Visit | Attending: Nephrology | Admitting: Nephrology

## 2016-05-31 DIAGNOSIS — N183 Chronic kidney disease, stage 3 unspecified: Secondary | ICD-10-CM

## 2016-05-31 DIAGNOSIS — D631 Anemia in chronic kidney disease: Secondary | ICD-10-CM | POA: Diagnosis not present

## 2016-05-31 LAB — POCT HEMOGLOBIN-HEMACUE: Hemoglobin: 11.2 g/dL — ABNORMAL LOW (ref 12.0–15.0)

## 2016-05-31 MED ORDER — EPOETIN ALFA 10000 UNIT/ML IJ SOLN
INTRAMUSCULAR | Status: AC
Start: 2016-05-31 — End: 2016-05-31
  Administered 2016-05-31: 10000 [IU]
  Filled 2016-05-31: qty 1

## 2016-05-31 MED ORDER — EPOETIN ALFA 20000 UNIT/ML IJ SOLN
INTRAMUSCULAR | Status: AC
  Administered 2016-05-31: 11:00:00 20000 [IU]
  Filled 2016-05-31: qty 1

## 2016-05-31 MED ORDER — EPOETIN ALFA 40000 UNIT/ML IJ SOLN: 30000.0000 [IU] | INTRAMUSCULAR | Status: DC

## 2016-06-07 ENCOUNTER — Encounter (HOSPITAL_COMMUNITY)
Admission: RE | Admit: 2016-06-07 | Discharge: 2016-06-07 | Disposition: A | Discharge status: Home / Self Care | Payer: Medicare Other | Source: Ambulatory Visit | Attending: Nephrology | Admitting: Nephrology

## 2016-06-07 DIAGNOSIS — N183 Chronic kidney disease, stage 3 unspecified: Secondary | ICD-10-CM

## 2016-06-07 DIAGNOSIS — D631 Anemia in chronic kidney disease: Secondary | ICD-10-CM | POA: Diagnosis not present

## 2016-06-07 LAB — POCT HEMOGLOBIN-HEMACUE: HEMOGLOBIN: 12.5 g/dL (ref 12.0–15.0)

## 2016-06-07 LAB — RENAL FUNCTION PANEL
ALBUMIN: 3.5 g/dL (ref 3.5–5.0)
ANION GAP: 10 (ref 5–15)
BUN: 43 mg/dL — AB (ref 6–20)
CHLORIDE: 106 mmol/L (ref 101–111)
CO2: 25 mmol/L (ref 22–32)
Calcium: 8.9 mg/dL (ref 8.9–10.3)
Creatinine, Ser: 1.61 mg/dL — ABNORMAL HIGH (ref 0.44–1.00)
GFR calc Af Amer: 36 mL/min — ABNORMAL LOW (ref >60)
GFR, EST NON AFRICAN AMERICAN: 31 mL/min — AB (ref >60)
Glucose, Bld: 207 mg/dL — ABNORMAL HIGH (ref 65–99)
PHOSPHORUS: 2.9 mg/dL (ref 2.5–4.6)
POTASSIUM: 3.7 mmol/L (ref 3.5–5.1)
Sodium: 141 mmol/L (ref 135–145)

## 2016-06-07 LAB — FERRITIN: Ferritin: 226 ng/mL (ref 11–307)

## 2016-06-07 LAB — IRON AND TIBC
IRON: 27 ug/dL — AB (ref 28–170)
SATURATION RATIOS: 13 % (ref 10.4–31.8)
TIBC: 214 ug/dL — ABNORMAL LOW (ref 250–450)
UIBC: 187 ug/dL

## 2016-06-07 MED ORDER — EPOETIN ALFA 40000 UNIT/ML IJ SOLN: 30000.0000 [IU] | INTRAMUSCULAR | Status: DC

## 2016-06-08 LAB — PTH, INTACT AND CALCIUM
Calcium, Total (PTH): 9.1 mg/dL (ref 8.7–10.3)
PTH: 127 pg/mL — AB (ref 15–65)

## 2016-06-11 DIAGNOSIS — H35033 Hypertensive retinopathy, bilateral: Secondary | ICD-10-CM | POA: Diagnosis not present

## 2016-06-11 DIAGNOSIS — H40033 Anatomical narrow angle, bilateral: Secondary | ICD-10-CM | POA: Diagnosis not present

## 2016-06-11 DIAGNOSIS — H2513 Age-related nuclear cataract, bilateral: Secondary | ICD-10-CM | POA: Diagnosis not present

## 2016-06-11 DIAGNOSIS — H25013 Cortical age-related cataract, bilateral: Secondary | ICD-10-CM | POA: Diagnosis not present

## 2016-06-11 DIAGNOSIS — H2512 Age-related nuclear cataract, left eye: Secondary | ICD-10-CM | POA: Diagnosis not present

## 2016-06-14 ENCOUNTER — Encounter (HOSPITAL_COMMUNITY): Payer: Medicare Other

## 2016-06-20 ENCOUNTER — Other Ambulatory Visit (HOSPITAL_COMMUNITY): Payer: Self-pay | Admitting: *Deleted

## 2016-06-21 ENCOUNTER — Encounter (HOSPITAL_COMMUNITY)
Admission: RE | Admit: 2016-06-21 | Discharge: 2016-06-21 | Disposition: A | Discharge status: Home / Self Care | Payer: Medicare Other | Source: Ambulatory Visit | Attending: Nephrology | Admitting: Nephrology

## 2016-06-21 ENCOUNTER — Encounter (HOSPITAL_COMMUNITY): Payer: Medicare Other

## 2016-06-21 DIAGNOSIS — N183 Chronic kidney disease, stage 3 unspecified: Secondary | ICD-10-CM

## 2016-06-21 DIAGNOSIS — D631 Anemia in chronic kidney disease: Secondary | ICD-10-CM | POA: Insufficient documentation

## 2016-06-21 HISTORY — PX: CATARACT EXTRACTION: SUR2

## 2016-06-21 LAB — POCT HEMOGLOBIN-HEMACUE: HEMOGLOBIN: 11.1 g/dL — AB (ref 12.0–15.0)

## 2016-06-21 MED ORDER — EPOETIN ALFA 40000 UNIT/ML IJ SOLN: 30000.0000 [IU] | INTRAMUSCULAR | Status: DC

## 2016-06-21 MED ORDER — SODIUM CHLORIDE 0.9 % IV SOLN
510.0000 mg | INTRAVENOUS | Status: DC
  Administered 2016-06-21: 510 mg via INTRAVENOUS
  Filled 2016-06-21: qty 17

## 2016-06-21 MED ORDER — EPOETIN ALFA 10000 UNIT/ML IJ SOLN
INTRAMUSCULAR | Status: AC
Start: 2016-06-21 — End: 2016-06-21
  Administered 2016-06-21: 10000 [IU]
  Filled 2016-06-21: qty 1

## 2016-06-21 MED ORDER — EPOETIN ALFA 20000 UNIT/ML IJ SOLN
INTRAMUSCULAR | Status: AC
  Administered 2016-06-21: 20000 [IU]
  Filled 2016-06-21: qty 1

## 2016-06-28 ENCOUNTER — Encounter (HOSPITAL_COMMUNITY)
Admission: RE | Admit: 2016-06-28 | Discharge: 2016-06-28 | Disposition: A | Discharge status: Home / Self Care | Payer: Medicare Other | Source: Ambulatory Visit | Attending: Nephrology | Admitting: Nephrology

## 2016-06-28 ENCOUNTER — Encounter (HOSPITAL_COMMUNITY): Payer: Medicare Other

## 2016-06-28 DIAGNOSIS — N183 Chronic kidney disease, stage 3 unspecified: Secondary | ICD-10-CM

## 2016-06-28 DIAGNOSIS — D631 Anemia in chronic kidney disease: Secondary | ICD-10-CM | POA: Diagnosis not present

## 2016-06-28 MED ORDER — SODIUM CHLORIDE 0.9 % IV SOLN
510.0000 mg | INTRAVENOUS | Status: AC
  Administered 2016-06-28: 11:00:00 510 mg via INTRAVENOUS
  Filled 2016-06-28: qty 17

## 2016-06-28 MED ORDER — EPOETIN ALFA 40000 UNIT/ML IJ SOLN: 30000.0000 [IU] | INTRAMUSCULAR | Status: DC

## 2016-07-01 DIAGNOSIS — Z1389 Encounter for screening for other disorder: Secondary | ICD-10-CM | POA: Diagnosis not present

## 2016-07-01 DIAGNOSIS — Z7189 Other specified counseling: Secondary | ICD-10-CM | POA: Diagnosis not present

## 2016-07-01 DIAGNOSIS — Z0000 Encounter for general adult medical examination without abnormal findings: Secondary | ICD-10-CM | POA: Diagnosis not present

## 2016-07-05 ENCOUNTER — Encounter (HOSPITAL_COMMUNITY)
Admission: RE | Admit: 2016-07-05 | Discharge: 2016-07-05 | Disposition: A | Discharge status: Home / Self Care | Payer: Medicare Other | Source: Ambulatory Visit | Attending: Nephrology | Admitting: Nephrology

## 2016-07-05 DIAGNOSIS — N183 Chronic kidney disease, stage 3 unspecified: Secondary | ICD-10-CM

## 2016-07-05 DIAGNOSIS — D631 Anemia in chronic kidney disease: Secondary | ICD-10-CM | POA: Diagnosis not present

## 2016-07-05 LAB — RENAL FUNCTION PANEL
Albumin: 3.6 g/dL (ref 3.5–5.0)
Anion gap: 8 (ref 5–15)
BUN: 66 mg/dL — ABNORMAL HIGH (ref 6–20)
CHLORIDE: 108 mmol/L (ref 101–111)
CO2: 26 mmol/L (ref 22–32)
CREATININE: 2.11 mg/dL — AB (ref 0.44–1.00)
Calcium: 9 mg/dL (ref 8.9–10.3)
GFR calc non Af Amer: 22 mL/min — ABNORMAL LOW (ref >60)
GFR, EST AFRICAN AMERICAN: 26 mL/min — AB (ref >60)
Glucose, Bld: 96 mg/dL (ref 65–99)
Phosphorus: 3.7 mg/dL (ref 2.5–4.6)
Potassium: 3.9 mmol/L (ref 3.5–5.1)
Sodium: 142 mmol/L (ref 135–145)

## 2016-07-05 LAB — IRON AND TIBC
IRON: 163 ug/dL (ref 28–170)
Saturation Ratios: 80 % — ABNORMAL HIGH (ref 10.4–31.8)
TIBC: 204 ug/dL — AB (ref 250–450)
UIBC: 41 ug/dL

## 2016-07-05 LAB — FERRITIN: FERRITIN: 649 ng/mL — AB (ref 11–307)

## 2016-07-05 LAB — POCT HEMOGLOBIN-HEMACUE: Hemoglobin: 11.3 g/dL — ABNORMAL LOW (ref 12.0–15.0)

## 2016-07-05 MED ORDER — EPOETIN ALFA 40000 UNIT/ML IJ SOLN
30000.0000 [IU] | INTRAMUSCULAR | Status: DC
Start: 2016-07-05 — End: 2016-07-06

## 2016-07-05 MED ORDER — EPOETIN ALFA 10000 UNIT/ML IJ SOLN
INTRAMUSCULAR | Status: AC
  Administered 2016-07-05: 10000 [IU] via SUBCUTANEOUS
  Filled 2016-07-05: qty 1

## 2016-07-05 MED ORDER — EPOETIN ALFA 20000 UNIT/ML IJ SOLN
INTRAMUSCULAR | Status: AC
  Administered 2016-07-05: 11:00:00 20000 [IU] via SUBCUTANEOUS
  Filled 2016-07-05: qty 1

## 2016-07-06 LAB — PTH, INTACT AND CALCIUM
Calcium, Total (PTH): 8.8 mg/dL (ref 8.7–10.3)
PTH: 100 pg/mL — ABNORMAL HIGH (ref 15–65)

## 2016-07-08 ENCOUNTER — Other Ambulatory Visit: Payer: Self-pay | Admitting: Internal Medicine

## 2016-07-08 ENCOUNTER — Other Ambulatory Visit: Payer: Self-pay | Admitting: Cardiology

## 2016-07-08 ENCOUNTER — Other Ambulatory Visit: Payer: Self-pay | Admitting: Pulmonary Disease

## 2016-07-08 NOTE — Telephone Encounter (Signed)
REFILL 

## 2016-07-10 DIAGNOSIS — H25812 Combined forms of age-related cataract, left eye: Secondary | ICD-10-CM | POA: Diagnosis not present

## 2016-07-10 DIAGNOSIS — H2512 Age-related nuclear cataract, left eye: Secondary | ICD-10-CM | POA: Diagnosis not present

## 2016-07-12 ENCOUNTER — Encounter (HOSPITAL_COMMUNITY): Payer: Medicare Other

## 2016-07-12 DIAGNOSIS — M9080 Osteopathy in diseases classified elsewhere, unspecified site: Secondary | ICD-10-CM | POA: Diagnosis not present

## 2016-07-12 DIAGNOSIS — N184 Chronic kidney disease, stage 4 (severe): Secondary | ICD-10-CM | POA: Diagnosis not present

## 2016-07-12 DIAGNOSIS — D638 Anemia in other chronic diseases classified elsewhere: Secondary | ICD-10-CM | POA: Diagnosis not present

## 2016-07-12 DIAGNOSIS — E889 Metabolic disorder, unspecified: Secondary | ICD-10-CM | POA: Diagnosis not present

## 2016-07-12 DIAGNOSIS — I10 Essential (primary) hypertension: Secondary | ICD-10-CM | POA: Diagnosis not present

## 2016-07-17 DIAGNOSIS — H25011 Cortical age-related cataract, right eye: Secondary | ICD-10-CM | POA: Diagnosis not present

## 2016-07-17 DIAGNOSIS — H2511 Age-related nuclear cataract, right eye: Secondary | ICD-10-CM | POA: Diagnosis not present

## 2016-07-19 ENCOUNTER — Encounter (HOSPITAL_COMMUNITY)
Admission: RE | Admit: 2016-07-19 | Discharge: 2016-07-19 | Disposition: A | Discharge status: Home / Self Care | Payer: Medicare Other | Source: Ambulatory Visit | Attending: Nephrology | Admitting: Nephrology

## 2016-07-19 DIAGNOSIS — N183 Chronic kidney disease, stage 3 unspecified: Secondary | ICD-10-CM

## 2016-07-19 DIAGNOSIS — D631 Anemia in chronic kidney disease: Secondary | ICD-10-CM | POA: Diagnosis not present

## 2016-07-19 LAB — POCT HEMOGLOBIN-HEMACUE: Hemoglobin: 11.2 g/dL — ABNORMAL LOW (ref 12.0–15.0)

## 2016-07-19 MED ORDER — EPOETIN ALFA 20000 UNIT/ML IJ SOLN
INTRAMUSCULAR | Status: AC
  Administered 2016-07-19: 20000 [IU] via SUBCUTANEOUS
  Filled 2016-07-19: qty 1

## 2016-07-19 MED ORDER — EPOETIN ALFA 10000 UNIT/ML IJ SOLN
INTRAMUSCULAR | Status: AC
  Administered 2016-07-19: 10000 [IU] via SUBCUTANEOUS
  Filled 2016-07-19: qty 1

## 2016-07-19 MED ORDER — EPOETIN ALFA 40000 UNIT/ML IJ SOLN: 30000.0000 [IU] | INTRAMUSCULAR | Status: DC

## 2016-08-01 ENCOUNTER — Ambulatory Visit: Payer: Medicare Other | Admitting: Podiatry

## 2016-08-02 ENCOUNTER — Encounter (HOSPITAL_COMMUNITY)
Admission: RE | Admit: 2016-08-02 | Discharge: 2016-08-02 | Disposition: A | Discharge status: Home / Self Care | Payer: Medicare Other | Source: Ambulatory Visit | Attending: Nephrology | Admitting: Nephrology

## 2016-08-02 DIAGNOSIS — N183 Chronic kidney disease, stage 3 unspecified: Secondary | ICD-10-CM

## 2016-08-02 DIAGNOSIS — D631 Anemia in chronic kidney disease: Secondary | ICD-10-CM | POA: Diagnosis not present

## 2016-08-02 LAB — RENAL FUNCTION PANEL
Albumin: 3.4 g/dL — ABNORMAL LOW (ref 3.5–5.0)
Anion gap: 8 (ref 5–15)
BUN: 41 mg/dL — ABNORMAL HIGH (ref 6–20)
CHLORIDE: 108 mmol/L (ref 101–111)
CO2: 27 mmol/L (ref 22–32)
CREATININE: 1.49 mg/dL — AB (ref 0.44–1.00)
Calcium: 9.1 mg/dL (ref 8.9–10.3)
GFR, EST AFRICAN AMERICAN: 40 mL/min — AB (ref >60)
GFR, EST NON AFRICAN AMERICAN: 34 mL/min — AB (ref >60)
Glucose, Bld: 179 mg/dL — ABNORMAL HIGH (ref 65–99)
POTASSIUM: 4.1 mmol/L (ref 3.5–5.1)
Phosphorus: 3.6 mg/dL (ref 2.5–4.6)
Sodium: 143 mmol/L (ref 135–145)

## 2016-08-02 LAB — IRON AND TIBC
IRON: 122 ug/dL (ref 28–170)
Saturation Ratios: 59 % — ABNORMAL HIGH (ref 10.4–31.8)
TIBC: 206 ug/dL — ABNORMAL LOW (ref 250–450)
UIBC: 84 ug/dL

## 2016-08-02 LAB — FERRITIN: Ferritin: 544 ng/mL — ABNORMAL HIGH (ref 11–307)

## 2016-08-02 LAB — POCT HEMOGLOBIN-HEMACUE: Hemoglobin: 11.6 g/dL — ABNORMAL LOW (ref 12.0–15.0)

## 2016-08-02 MED ORDER — EPOETIN ALFA 20000 UNIT/ML IJ SOLN
INTRAMUSCULAR | Status: AC
  Administered 2016-08-02: 11:00:00 20000 [IU] via SUBCUTANEOUS
  Filled 2016-08-02: qty 1

## 2016-08-02 MED ORDER — EPOETIN ALFA 40000 UNIT/ML IJ SOLN: 30000.0000 [IU] | INTRAMUSCULAR | Status: DC

## 2016-08-02 MED ORDER — EPOETIN ALFA 10000 UNIT/ML IJ SOLN
INTRAMUSCULAR | Status: AC
  Administered 2016-08-02: 10000 [IU] via SUBCUTANEOUS
  Filled 2016-08-02: qty 1

## 2016-08-03 LAB — PTH, INTACT AND CALCIUM
CALCIUM TOTAL (PTH): 9 mg/dL (ref 8.7–10.3)
PTH: 116 pg/mL — ABNORMAL HIGH (ref 15–65)

## 2016-08-13 IMAGING — CT CT HEAD W/O CM
2 series · 15 of 30 positions shown, 19 images · non-contrast
Comparison: CT head November 02, 2014

CLINICAL DATA: Severe posterior headache. History of seizures and
stroke, diabetes, hypertension, hyperlipidemia.

EXAM:
CT HEAD WITHOUT CONTRAST
TECHNIQUE: Contiguous axial images were obtained from the base of the skull
through the vertex without intravenous contrast.

[Series 201: head w/o, idose (1) · axial · non-contrast · 0.41mm/px · z∈[+78,+208]mm · 13 of 32 slices shown, 17 images]
[im 3/32  brain]
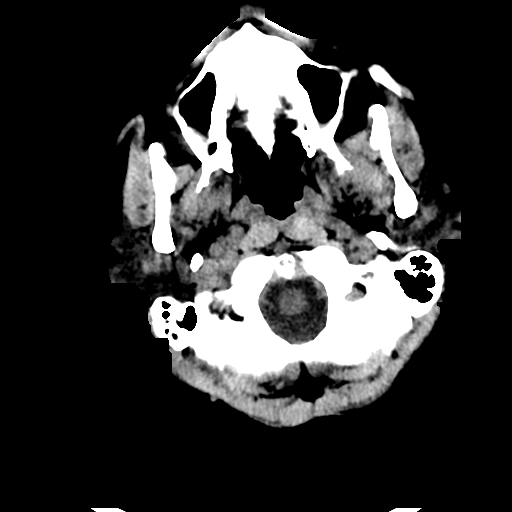
[im 3/32  bone]
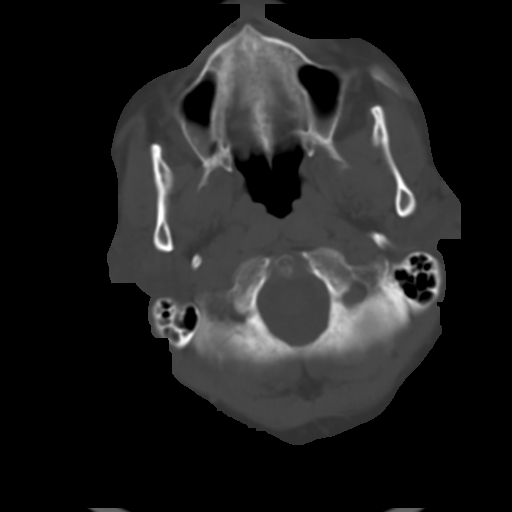
[im 5/32  brain]
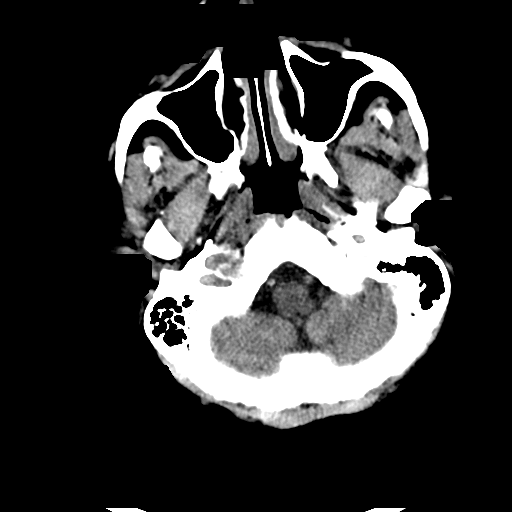
[im 7/32  brain]
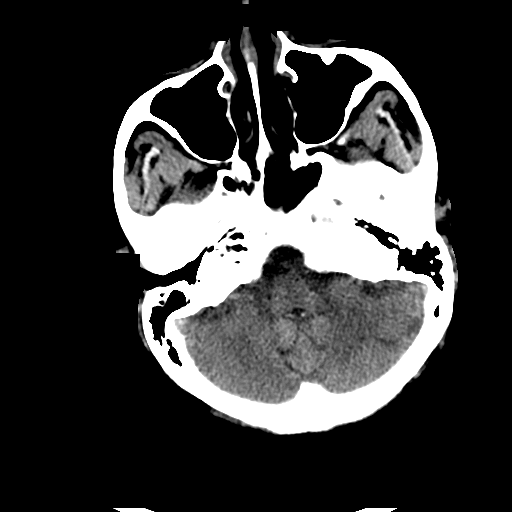
[im 9/32  brain]
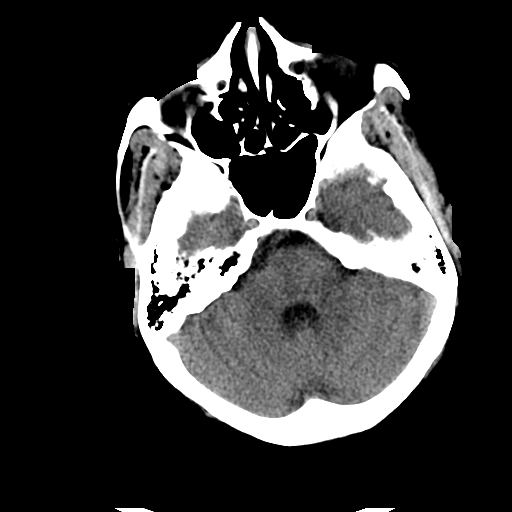
[im 12/32  brain]
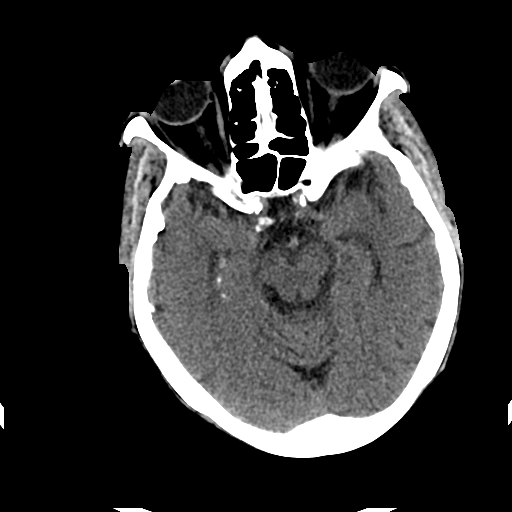
[im 12/32  bone]
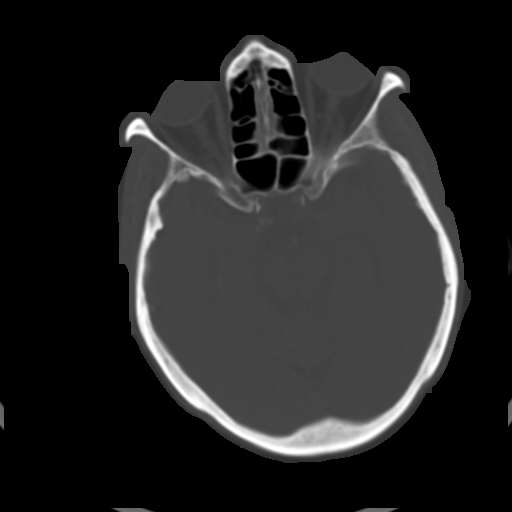
[im 14/32  brain]
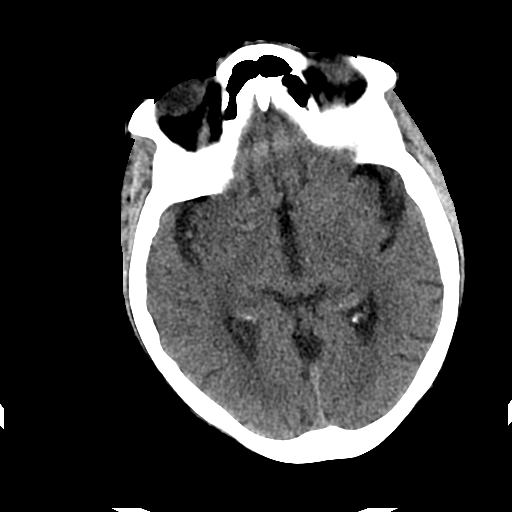
[im 16/32  brain]
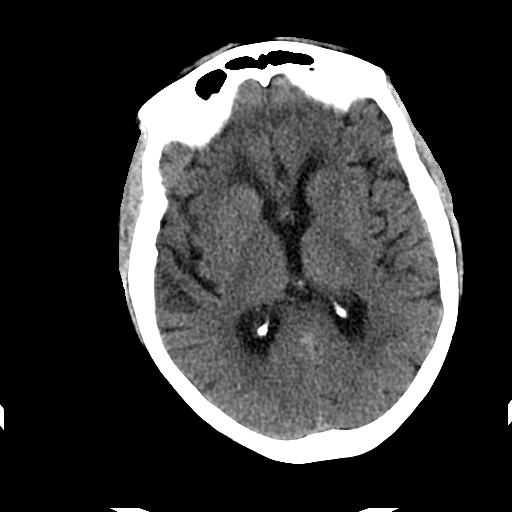
[im 18/32  brain]
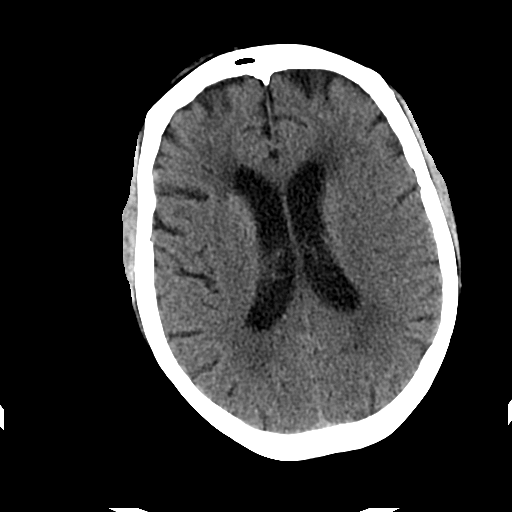
[im 20/32  brain]
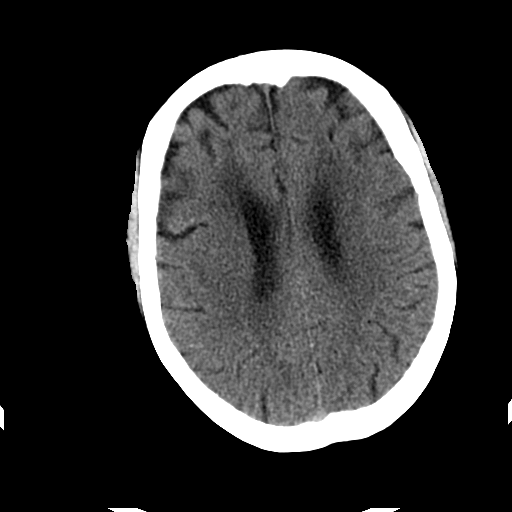
[im 20/32  bone]
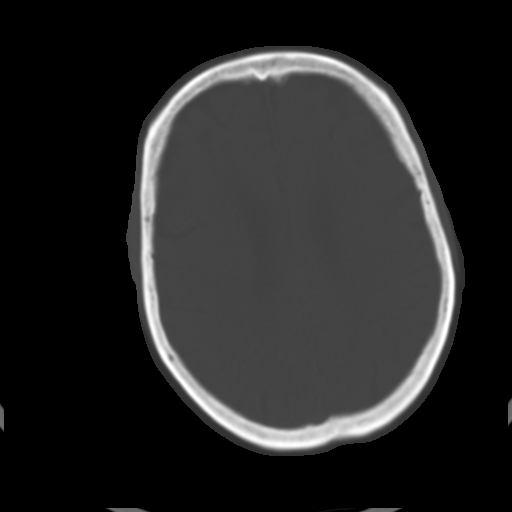
[im 23/32  brain]
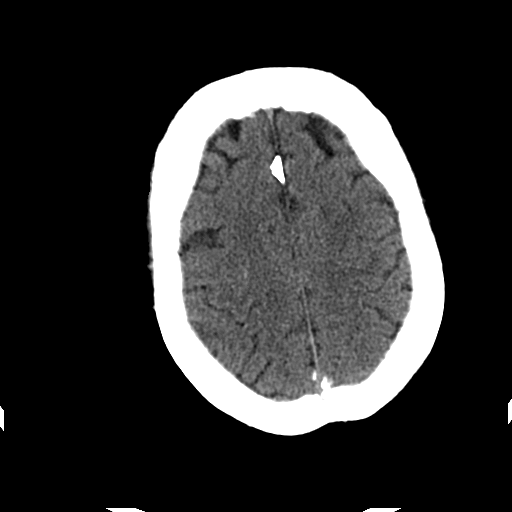
[im 25/32  brain]
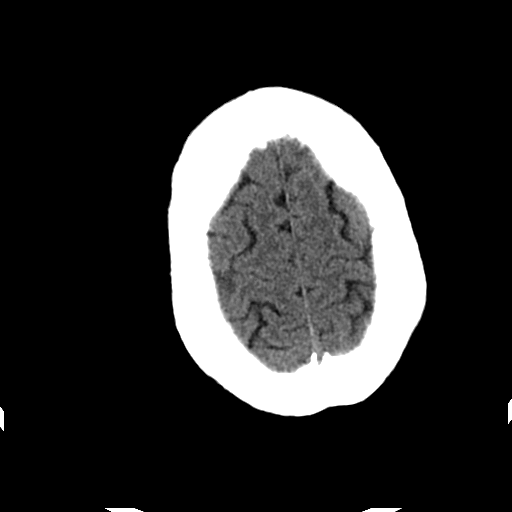
[im 27/32  brain]
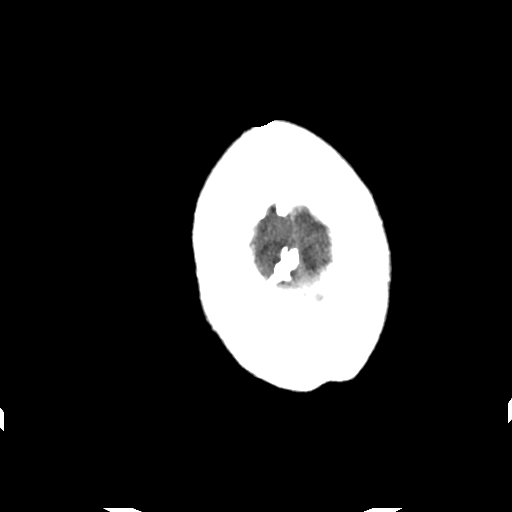
[im 29/32  brain]
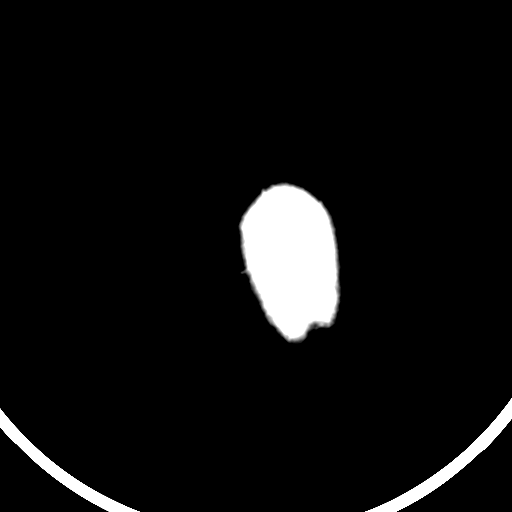
[im 29/32  bone]
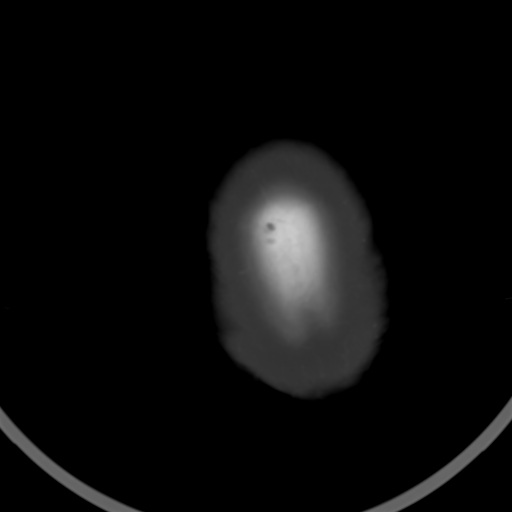

[Series 202: head w/o bone, idose (1) · axial · non-contrast · 0.41mm/px · z∈[+78,+98]mm · 2 of 32 slices shown]
[im 3/32  bone]
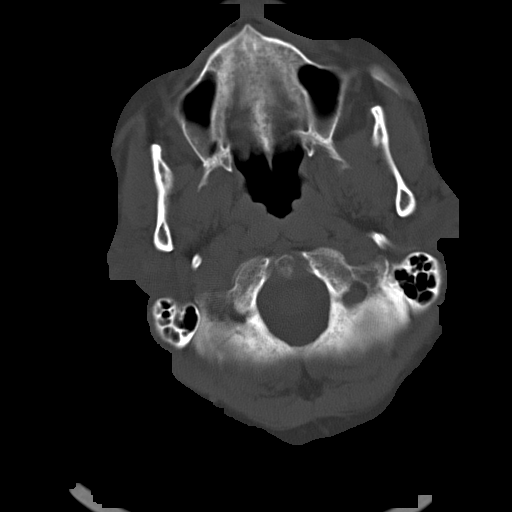
[im 7/32  bone]
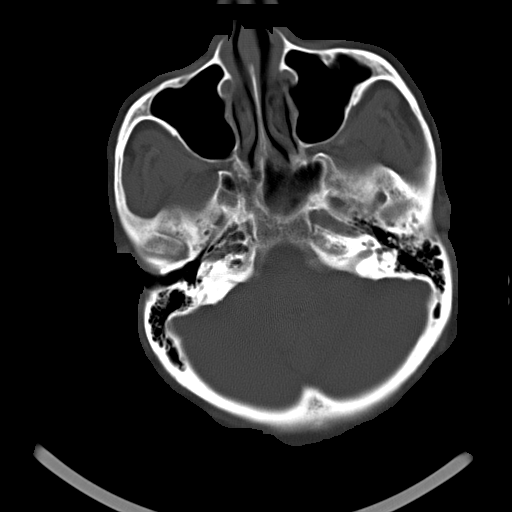

[15 of 30 positions shown; findings below may reference images not displayed]

FINDINGS: INTRACRANIAL CONTENTS: The ventricles and sulci are normal for age.
No intraparenchymal hemorrhage, mass effect nor midline shift.
Patchy to come supratentorial white matter hypodensities are
relatively unchanged. Old Small LEFT cerebellar infarct. No acute
large vascular territory infarcts. No abnormal extra-axial fluid
collections. Basal cisterns are patent. Mild calcific
atherosclerosis of the carotid siphons.

ORBITS: The included ocular globes and orbital contents are
non-suspicious.

SINUSES: The mastoid aircells and included paranasal sinuses are
well-aerated.

SKULL/SOFT TISSUES: No skull fracture. No significant soft tissue
swelling. Patient is edentulous.
IMPRESSION: No acute intracranial process.

Stable moderate to severe chronic small vessel ischemic disease and
old LEFT cerebellar infarct.

## 2016-08-13 IMAGING — DX DG CHEST 2V
2 series · 2 of 2 positions shown · non-contrast
Comparison: Chest radiograph performed 09/22/2014

CLINICAL DATA: Acute onset of generalized weakness, shortness of
breath and headache. Cough and dyspnea. Vomiting. Initial encounter.

EXAM:
CHEST  2 VIEW

[chest lat]
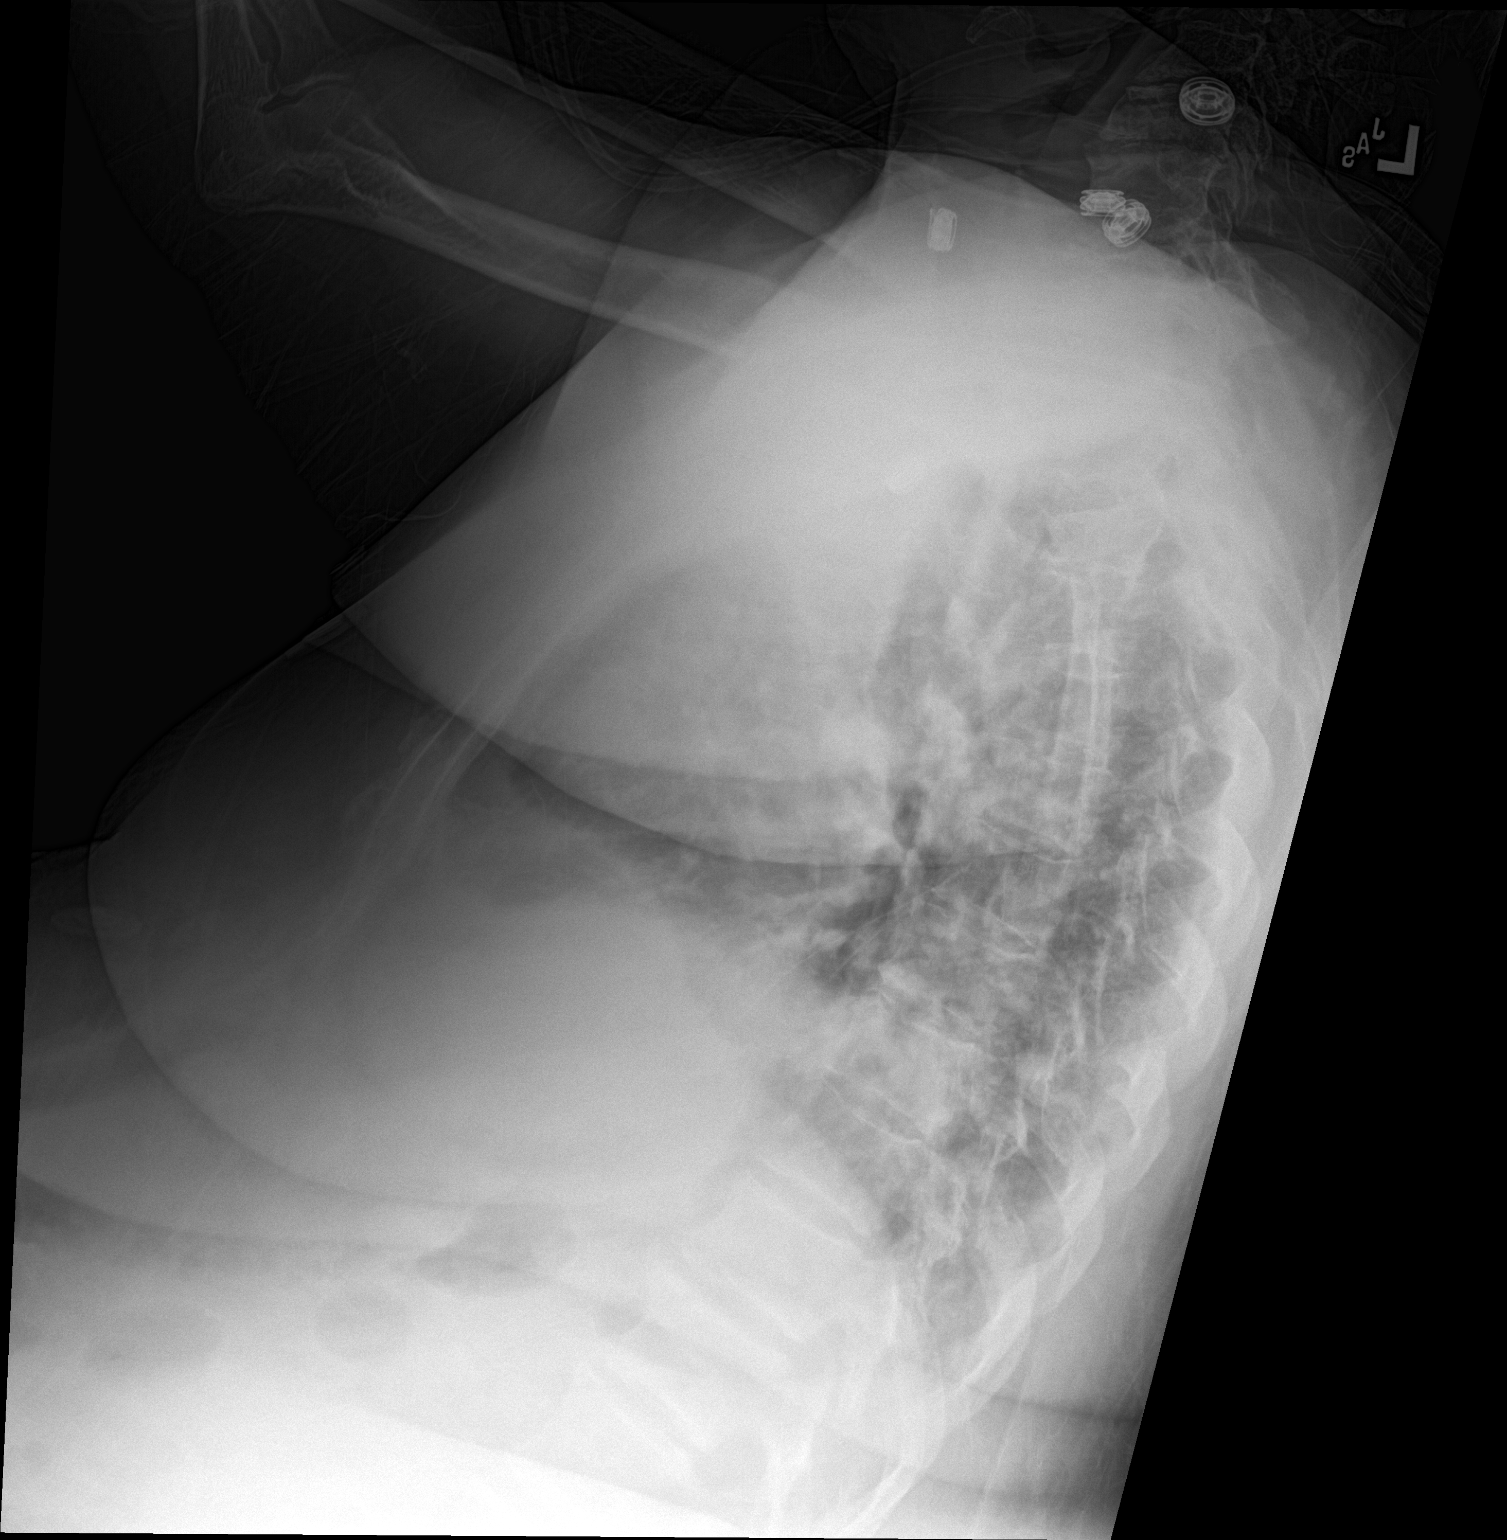

[chest ap]
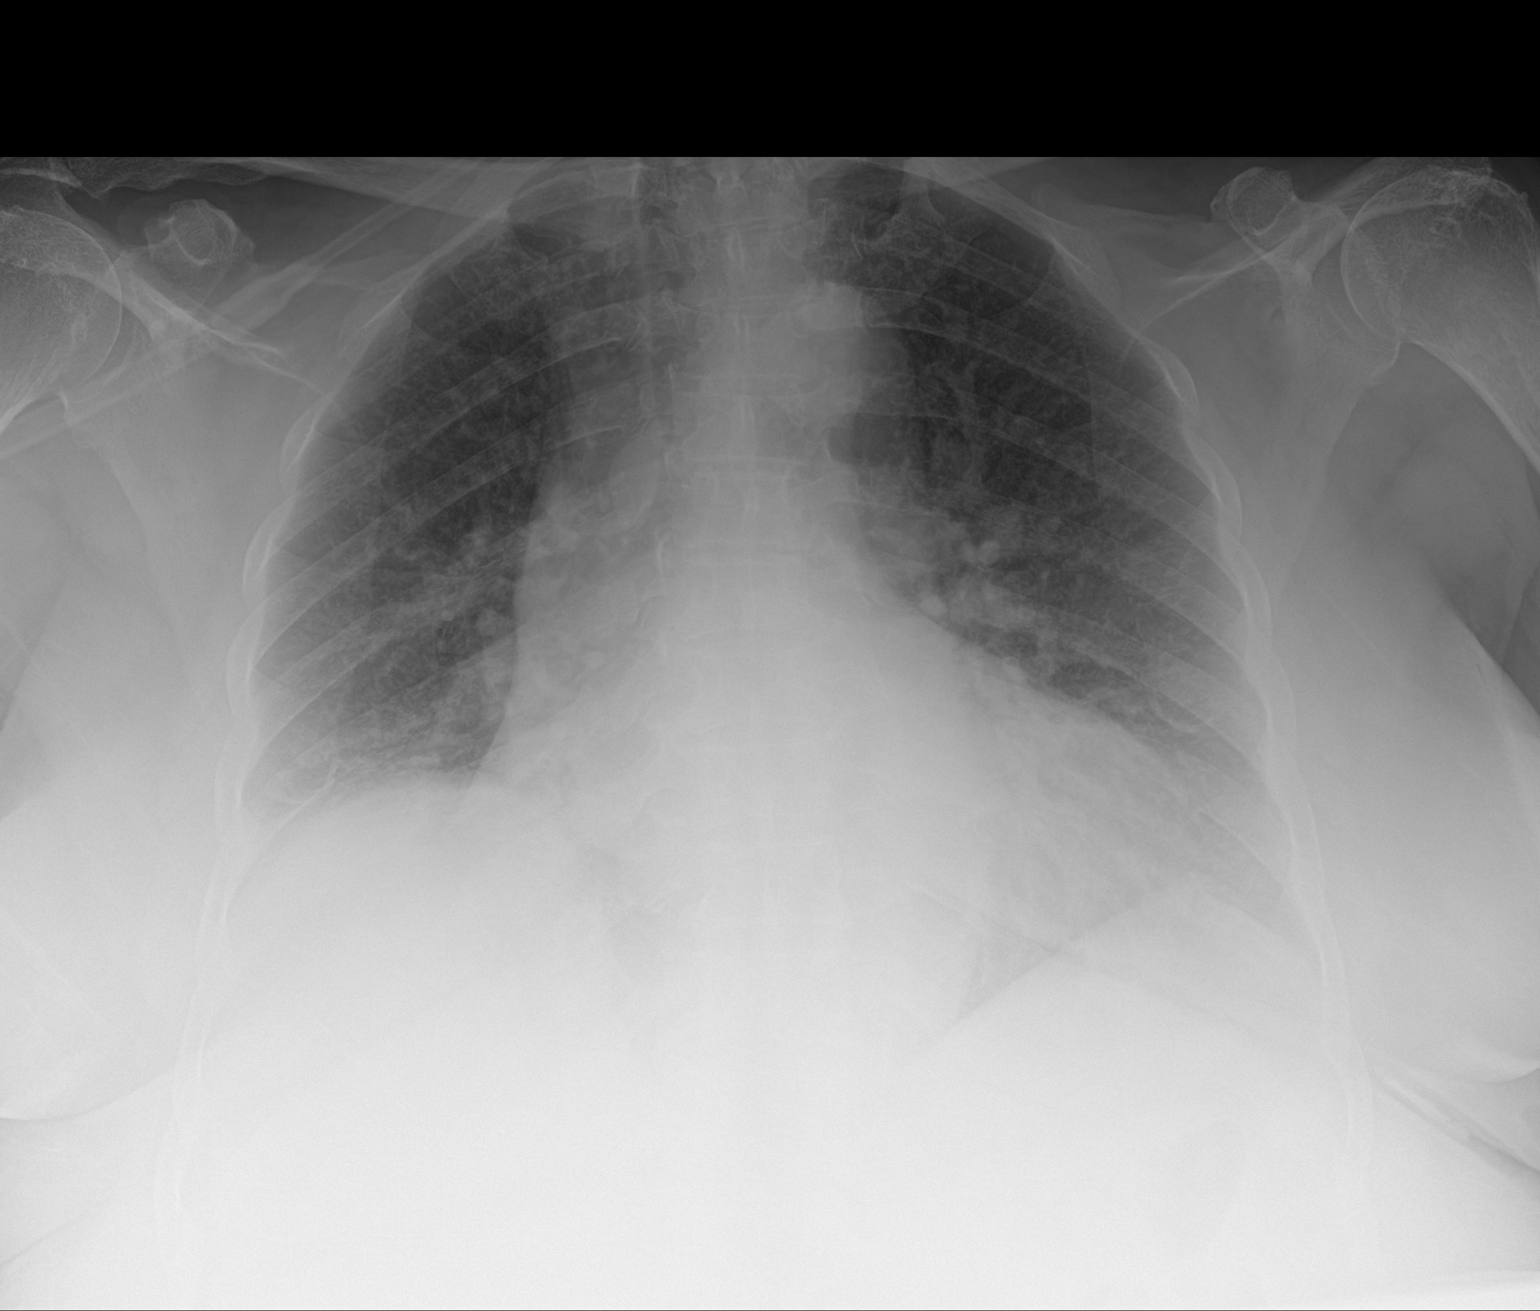

[2 of 2 positions shown; findings below may reference images not displayed]

FINDINGS: The lungs are well-aerated. Mild vascular congestion is noted.
Mildly increased interstitial markings may reflect minimal
interstitial edema. There is no evidence of pleural effusion or
pneumothorax.

The heart is mildly enlarged. No acute osseous abnormalities are
seen.
IMPRESSION: Mild vascular congestion and mild cardiomegaly. Mildly increased
interstitial markings may reflect minimal interstitial edema.

## 2016-08-15 IMAGING — DX DG HIP (WITH OR WITHOUT PELVIS) 2-3V*L*
3 series · 3 of 3 positions shown · non-contrast
Comparison: None

CLINICAL DATA: LEFT hip pain

EXAM:
DG HIP (WITH OR WITHOUT PELVIS) 2-3V LEFT

[pelvis ap]
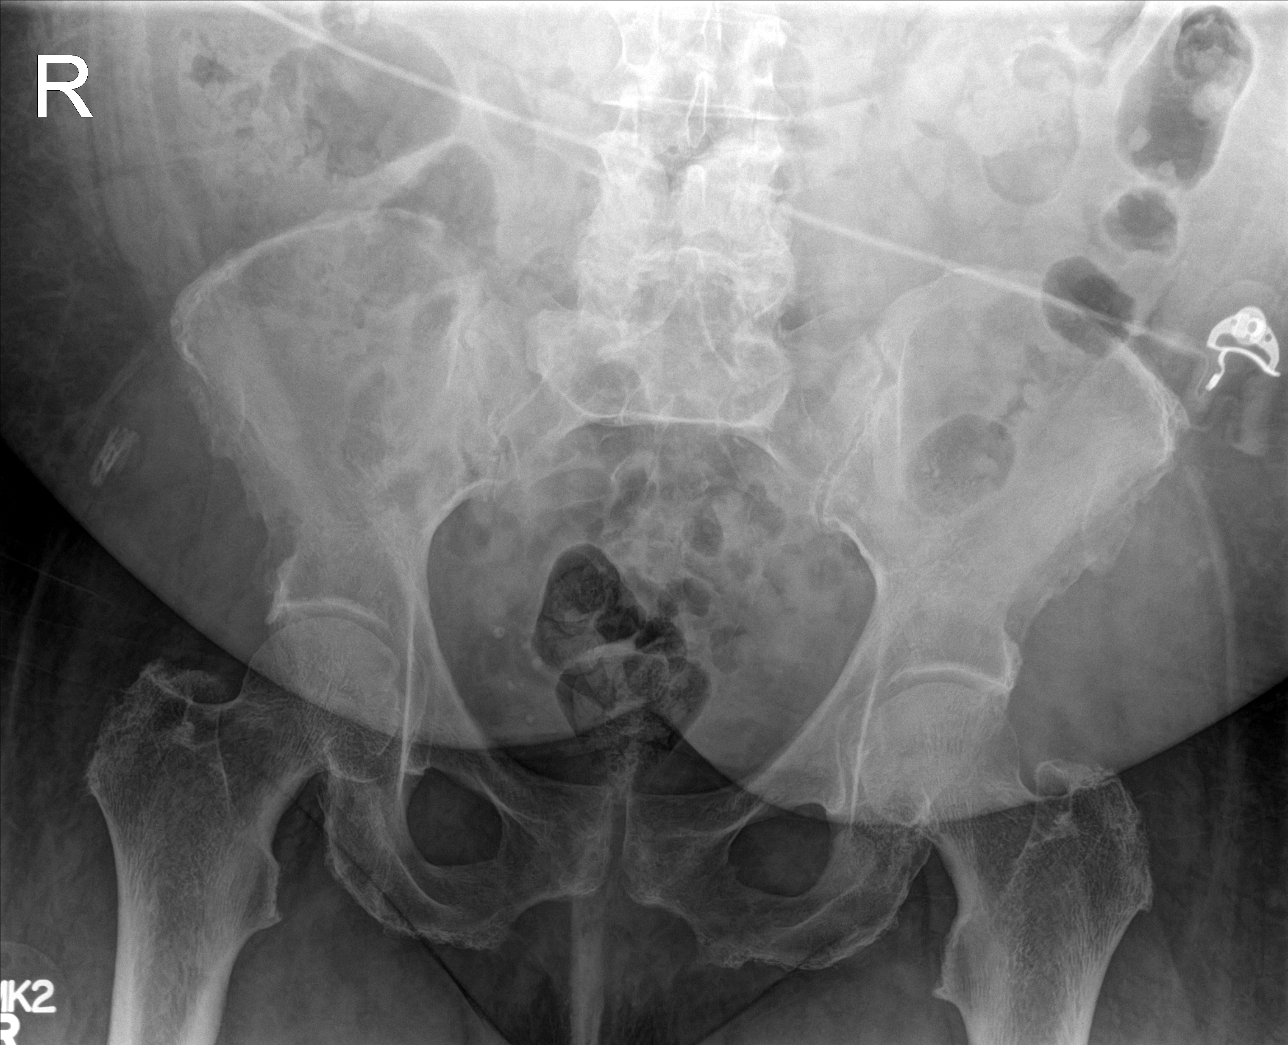

[hip ap]
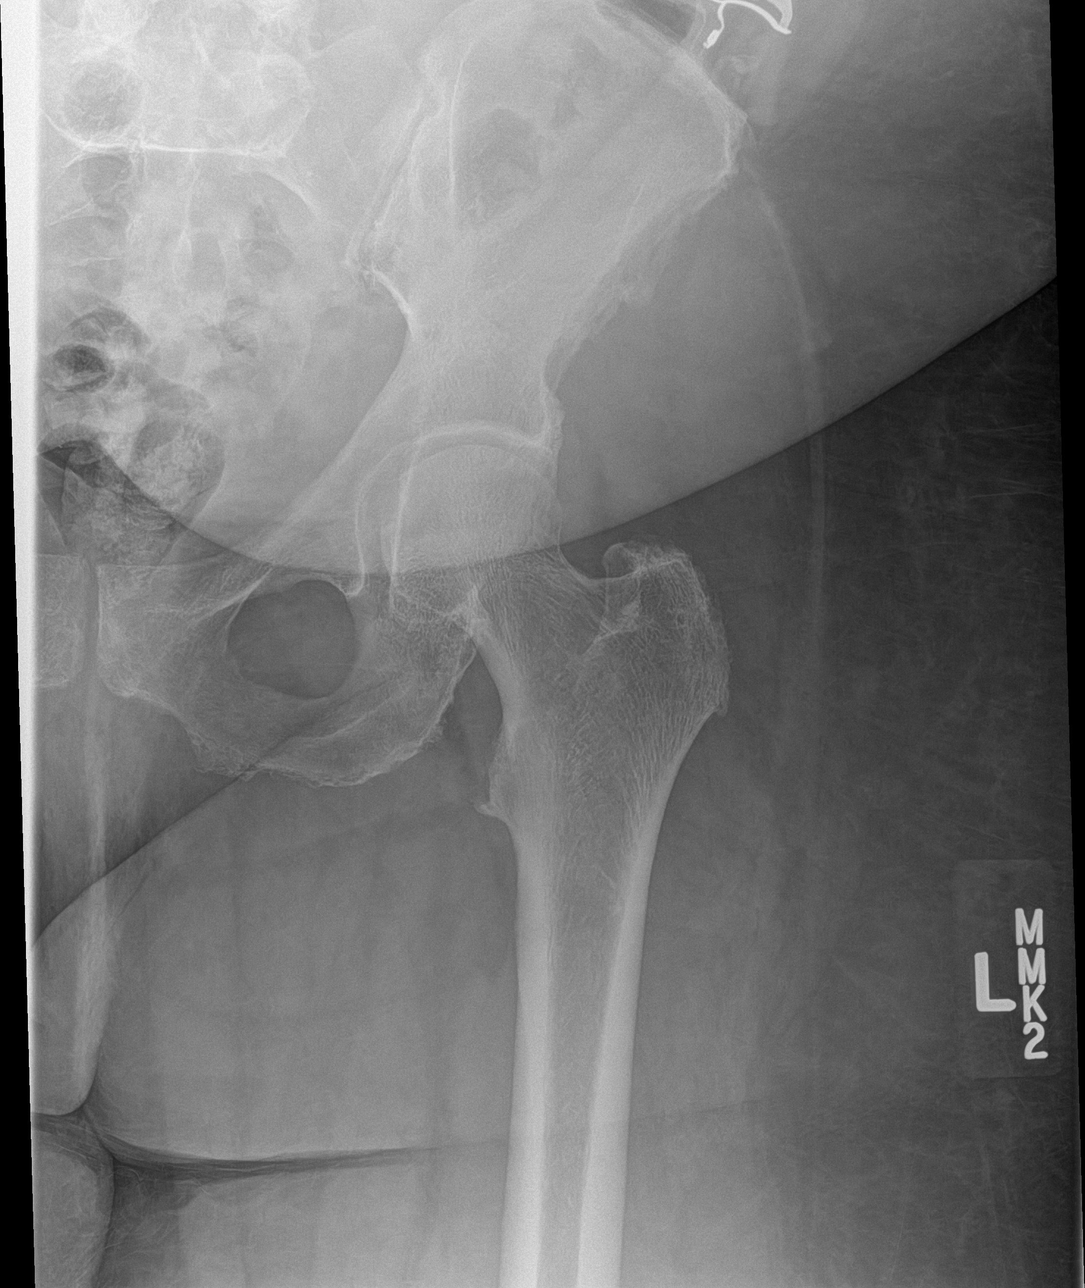

[hip lat]
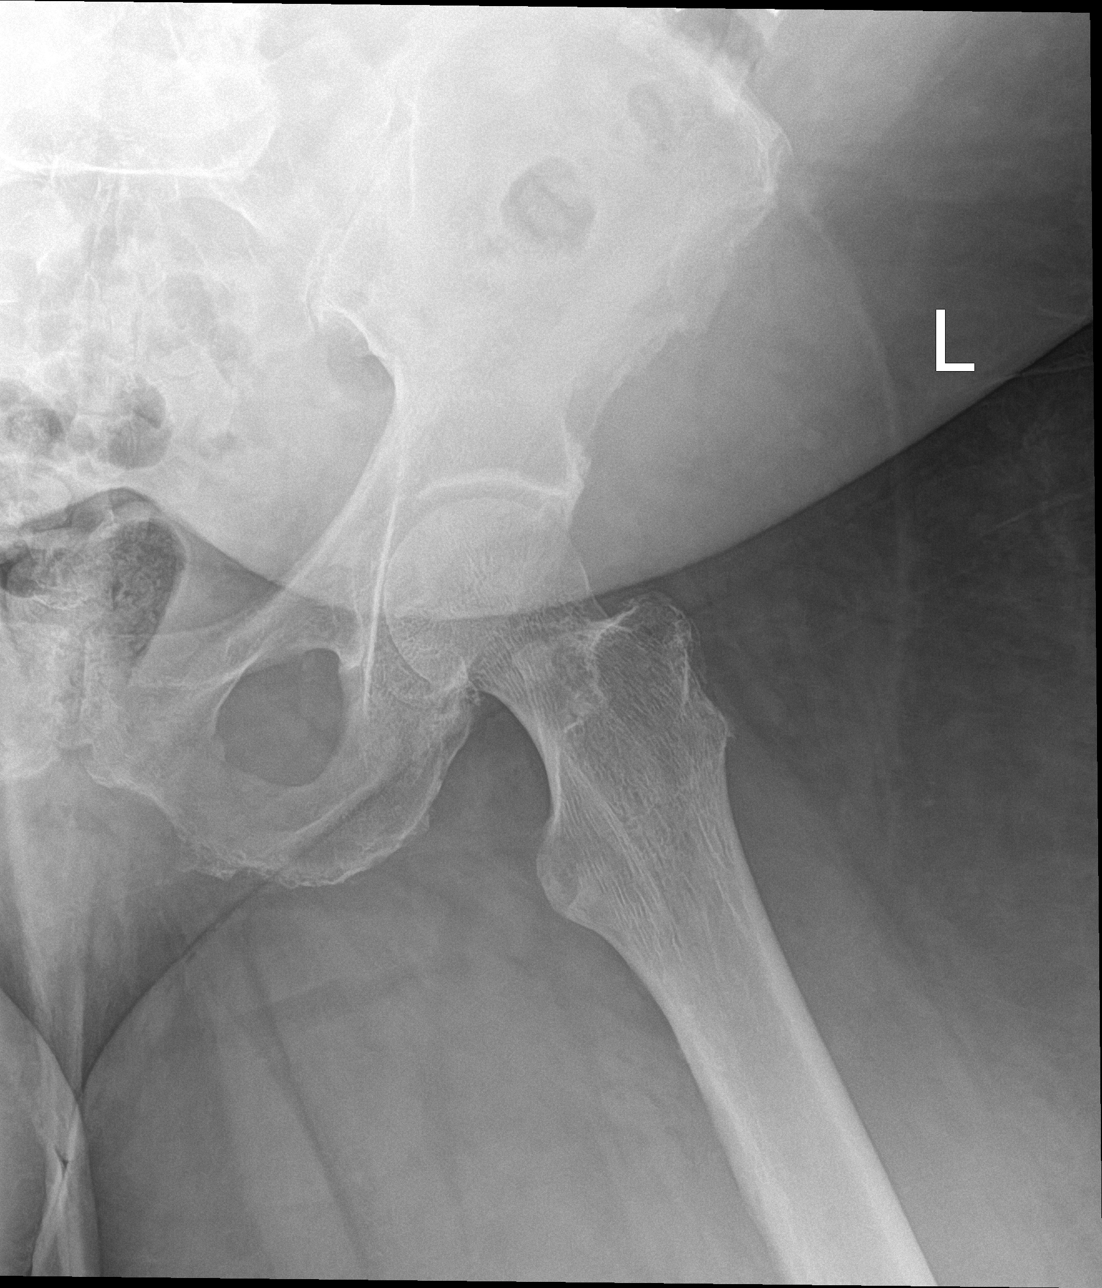

[3 of 3 positions shown; findings below may reference images not displayed]

FINDINGS: Diffuse osseous demineralization.

Degenerative disc and facet disease changes at visualized lower
lumbar spine.

Hip joints symmetric and preserved.

No acute fracture, subluxation, or bone destruction.

SI joints symmetric.
IMPRESSION: No acute LEFT hip abnormalities.

Degenerative changes at visualized lower lumbar spine.

## 2016-08-16 ENCOUNTER — Encounter (HOSPITAL_COMMUNITY)
Admission: RE | Admit: 2016-08-16 | Discharge: 2016-08-16 | Disposition: A | Discharge status: Home / Self Care | Payer: Medicare Other | Source: Ambulatory Visit | Attending: Nephrology | Admitting: Nephrology

## 2016-08-16 ENCOUNTER — Other Ambulatory Visit: Payer: Self-pay | Admitting: Pulmonary Disease

## 2016-08-16 DIAGNOSIS — N183 Chronic kidney disease, stage 3 unspecified: Secondary | ICD-10-CM

## 2016-08-16 DIAGNOSIS — D631 Anemia in chronic kidney disease: Secondary | ICD-10-CM | POA: Diagnosis not present

## 2016-08-16 LAB — POCT HEMOGLOBIN-HEMACUE: Hemoglobin: 11.7 g/dL — ABNORMAL LOW (ref 12.0–15.0)

## 2016-08-16 MED ORDER — EPOETIN ALFA 10000 UNIT/ML IJ SOLN
INTRAMUSCULAR | Status: AC
  Administered 2016-08-16: 11:00:00 10000 [IU] via SUBCUTANEOUS
  Filled 2016-08-16: qty 1

## 2016-08-16 MED ORDER — EPOETIN ALFA 40000 UNIT/ML IJ SOLN: 30000.0000 [IU] | INTRAMUSCULAR | Status: DC

## 2016-08-16 MED ORDER — EPOETIN ALFA 20000 UNIT/ML IJ SOLN
INTRAMUSCULAR | Status: AC
  Administered 2016-08-16: 20000 [IU] via SUBCUTANEOUS
  Filled 2016-08-16: qty 1

## 2016-08-22 ENCOUNTER — Encounter (HOSPITAL_COMMUNITY): Payer: Medicare Other

## 2016-08-28 ENCOUNTER — Ambulatory Visit: Payer: Medicare Other | Admitting: Podiatry

## 2016-08-29 ENCOUNTER — Ambulatory Visit (INDEPENDENT_AMBULATORY_CARE_PROVIDER_SITE_OTHER): Payer: Medicare Other | Admitting: Podiatry

## 2016-08-29 ENCOUNTER — Encounter: Payer: Self-pay | Admitting: Podiatry

## 2016-08-29 DIAGNOSIS — B351 Tinea unguium: Secondary | ICD-10-CM

## 2016-08-29 DIAGNOSIS — M79606 Pain in leg, unspecified: Secondary | ICD-10-CM

## 2016-08-29 NOTE — Progress Notes (Signed)
Subjective: 71y.o. year old IDDM female patient presents requesting toe nails trimmed. Toes hurt with thick nails.  Patient walks with cane.  Had cataract surgery done on left in 06/27/16. This morning B.S. Was 141.  Objective: Dermatologic:  Hypertrophic nails x 10. Deformed nails with fungal debris both great toe nails. Normal color and no broken skin on injured toe left hallux.  Vascular: All pedal pulses are palpable bilateral.  Enlarged ankle joint without active edema. Orthopedic: Mild hallux valgus with bunion on left foot.  Excess first ray sagittal plane motion R>L.  Tight achilles tendon on both feet. Not symptomatic. Neurologic: All epicritic and tactile sensations grossly intact.   Assessment: Dystrophic mycotic nails x 10.  IDDM, under control.  Painful nails.   Treatment: All mycotic nails debrided.  Return in 3 months or as needed

## 2016-08-29 NOTE — Patient Instructions (Signed)
Seen for hypertrophic nails. All nails debrided. Return in 3 months or as needed.  

## 2016-08-30 ENCOUNTER — Encounter (HOSPITAL_COMMUNITY)
Admission: RE | Admit: 2016-08-30 | Discharge: 2016-08-30 | Disposition: A | Discharge status: Home / Self Care | Payer: Medicare Other | Source: Ambulatory Visit | Attending: Nephrology | Admitting: Nephrology

## 2016-08-30 DIAGNOSIS — D631 Anemia in chronic kidney disease: Secondary | ICD-10-CM | POA: Insufficient documentation

## 2016-08-30 DIAGNOSIS — N183 Chronic kidney disease, stage 3 unspecified: Secondary | ICD-10-CM

## 2016-08-30 LAB — FERRITIN: Ferritin: 481 ng/mL — ABNORMAL HIGH (ref 11–307)

## 2016-08-30 LAB — RENAL FUNCTION PANEL
ANION GAP: 10 (ref 5–15)
Albumin: 3.4 g/dL — ABNORMAL LOW (ref 3.5–5.0)
BUN: 43 mg/dL — ABNORMAL HIGH (ref 6–20)
CHLORIDE: 107 mmol/L (ref 101–111)
CO2: 27 mmol/L (ref 22–32)
Calcium: 8.7 mg/dL — ABNORMAL LOW (ref 8.9–10.3)
Creatinine, Ser: 1.57 mg/dL — ABNORMAL HIGH (ref 0.44–1.00)
GFR calc Af Amer: 37 mL/min — ABNORMAL LOW (ref >60)
GFR calc non Af Amer: 32 mL/min — ABNORMAL LOW (ref >60)
GLUCOSE: 154 mg/dL — AB (ref 65–99)
Phosphorus: 3.9 mg/dL (ref 2.5–4.6)
Potassium: 4 mmol/L (ref 3.5–5.1)
Sodium: 144 mmol/L (ref 135–145)

## 2016-08-30 LAB — IRON AND TIBC
IRON: 110 ug/dL (ref 28–170)
SATURATION RATIOS: 53 % — AB (ref 10.4–31.8)
TIBC: 209 ug/dL — AB (ref 250–450)
UIBC: 99 ug/dL

## 2016-08-30 LAB — POCT HEMOGLOBIN-HEMACUE: Hemoglobin: 11.1 g/dL — ABNORMAL LOW (ref 12.0–15.0)

## 2016-08-30 MED ORDER — EPOETIN ALFA 40000 UNIT/ML IJ SOLN: 30000.0000 [IU] | INTRAMUSCULAR | Status: DC

## 2016-08-30 MED ORDER — EPOETIN ALFA 20000 UNIT/ML IJ SOLN
INTRAMUSCULAR | Status: AC
  Administered 2016-08-30: 20000 [IU]
  Filled 2016-08-30: qty 1

## 2016-08-30 MED ORDER — EPOETIN ALFA 10000 UNIT/ML IJ SOLN
INTRAMUSCULAR | Status: AC
  Administered 2016-08-30: 10000 [IU]
  Filled 2016-08-30: qty 1

## 2016-08-31 LAB — PTH, INTACT AND CALCIUM
Calcium, Total (PTH): 8.9 mg/dL (ref 8.7–10.3)
PTH: 121 pg/mL — ABNORMAL HIGH (ref 15–65)

## 2016-09-06 ENCOUNTER — Encounter (HOSPITAL_COMMUNITY): Payer: Medicare Other

## 2016-09-13 ENCOUNTER — Encounter (HOSPITAL_COMMUNITY)
Admission: RE | Admit: 2016-09-13 | Discharge: 2016-09-13 | Disposition: A | Discharge status: Home / Self Care | Payer: Medicare Other | Source: Ambulatory Visit | Attending: Nephrology | Admitting: Nephrology

## 2016-09-13 DIAGNOSIS — N183 Chronic kidney disease, stage 3 unspecified: Secondary | ICD-10-CM

## 2016-09-13 DIAGNOSIS — D631 Anemia in chronic kidney disease: Secondary | ICD-10-CM | POA: Diagnosis not present

## 2016-09-13 LAB — POCT HEMOGLOBIN-HEMACUE: HEMOGLOBIN: 10.4 g/dL — AB (ref 12.0–15.0)

## 2016-09-13 MED ORDER — EPOETIN ALFA 10000 UNIT/ML IJ SOLN
INTRAMUSCULAR | Status: AC
  Administered 2016-09-13: 10000 [IU]
  Filled 2016-09-13: qty 1

## 2016-09-13 MED ORDER — EPOETIN ALFA 20000 UNIT/ML IJ SOLN
INTRAMUSCULAR | Status: AC
  Administered 2016-09-13: 20000 [IU]
  Filled 2016-09-13: qty 1

## 2016-09-13 MED ORDER — EPOETIN ALFA 40000 UNIT/ML IJ SOLN: 30000.0000 [IU] | INTRAMUSCULAR | Status: DC

## 2016-09-16 DIAGNOSIS — H35033 Hypertensive retinopathy, bilateral: Secondary | ICD-10-CM | POA: Diagnosis not present

## 2016-09-20 ENCOUNTER — Encounter (HOSPITAL_COMMUNITY): Payer: Medicare Other

## 2016-09-27 ENCOUNTER — Encounter (HOSPITAL_COMMUNITY)
Admission: RE | Admit: 2016-09-27 | Discharge: 2016-09-27 | Disposition: A | Discharge status: Home / Self Care | Payer: Medicare Other | Source: Ambulatory Visit | Attending: Nephrology | Admitting: Nephrology

## 2016-09-27 DIAGNOSIS — N183 Chronic kidney disease, stage 3 unspecified: Secondary | ICD-10-CM

## 2016-09-27 DIAGNOSIS — D631 Anemia in chronic kidney disease: Secondary | ICD-10-CM | POA: Insufficient documentation

## 2016-09-27 LAB — RENAL FUNCTION PANEL
ALBUMIN: 3.3 g/dL — AB (ref 3.5–5.0)
Anion gap: 11 (ref 5–15)
BUN: 55 mg/dL — AB (ref 6–20)
CO2: 23 mmol/L (ref 22–32)
Calcium: 8.8 mg/dL — ABNORMAL LOW (ref 8.9–10.3)
Chloride: 108 mmol/L (ref 101–111)
Creatinine, Ser: 1.85 mg/dL — ABNORMAL HIGH (ref 0.44–1.00)
GFR calc Af Amer: 30 mL/min — ABNORMAL LOW (ref >60)
GFR calc non Af Amer: 26 mL/min — ABNORMAL LOW (ref >60)
GLUCOSE: 135 mg/dL — AB (ref 65–99)
PHOSPHORUS: 4.2 mg/dL (ref 2.5–4.6)
POTASSIUM: 4 mmol/L (ref 3.5–5.1)
Sodium: 142 mmol/L (ref 135–145)

## 2016-09-27 LAB — FERRITIN: Ferritin: 559 ng/mL — ABNORMAL HIGH (ref 11–307)

## 2016-09-27 LAB — IRON AND TIBC
Iron: 107 ug/dL (ref 28–170)
SATURATION RATIOS: 52 % — AB (ref 10.4–31.8)
TIBC: 207 ug/dL — AB (ref 250–450)
UIBC: 100 ug/dL

## 2016-09-27 LAB — POCT HEMOGLOBIN-HEMACUE: Hemoglobin: 11.4 g/dL — ABNORMAL LOW (ref 12.0–15.0)

## 2016-09-27 MED ORDER — EPOETIN ALFA 40000 UNIT/ML IJ SOLN: 30000.0000 [IU] | INTRAMUSCULAR | Status: DC

## 2016-09-27 MED ORDER — EPOETIN ALFA 20000 UNIT/ML IJ SOLN
INTRAMUSCULAR | Status: AC
  Administered 2016-09-27: 20000 [IU] via SUBCUTANEOUS
  Filled 2016-09-27: qty 1

## 2016-09-27 MED ORDER — EPOETIN ALFA 10000 UNIT/ML IJ SOLN
INTRAMUSCULAR | Status: AC
  Administered 2016-09-27: 10000 [IU] via SUBCUTANEOUS
  Filled 2016-09-27: qty 1

## 2016-09-28 LAB — PTH, INTACT AND CALCIUM
CALCIUM TOTAL (PTH): 8.6 mg/dL — AB (ref 8.7–10.3)
PTH: 134 pg/mL — ABNORMAL HIGH (ref 15–65)

## 2016-10-07 DIAGNOSIS — H3581 Retinal edema: Secondary | ICD-10-CM | POA: Insufficient documentation

## 2016-10-07 NOTE — Progress Notes (Signed)
[facility] Note  10/08/2016     CHIEF COMPLAINT Patient presents for Retina Evaluation and Diabetic Eye Exam   HISTORY OF PRESENT ILLNESS: Joann Gutierrez is a 71 y.o. female who presents to the clinic today for:   HPI    Retina Evaluation  In left eye.  This started 3 months ago.  Associated Symptoms Flashes, Floaters and Pain.  Negative for Distortion, Blind Spot, Photophobia, Fever, Scalp Tenderness, Glare, Jaw Claudication, Weight Loss, Redness, Trauma, Shoulder/Hip pain and Fatigue.  Context:  distance vision, mid-range vision and near vision.  Treatments tried include eye drops.  Response to treatment was no improvement.        Diabetic Eye Exam  Vision is blurred for distance, is blurred for near and fluctuates with blood sugars.  Associated Symptoms Floaters, Flashes and Pain.  Negative for Distortion, Redness, Trauma, Shoulder/Hip pain, Fatigue, Blind Spot, Photophobia, Scalp Tenderness, Fever, Glare, Jaw Claudication and Weight Loss.  Diabetes characteristics include Type 2.  This started 1997.  Blood sugar level fluctuates.  Last Blood Glucose 180.  Last A1C: unknown.  Associated Diagnosis Kidney Disease.        Comments  Pt presents on the referral of Dr. Read Drivers for evaluation of CME OS; Pt states she noticed issues with OS  After having cataract sx OS; Pt states that she had cat sx in June of 2018; Pt reports that OS "got worse after my surgery"; Pt states that she is having floaters and flashes OS; Pt denies wavy VA; Reports that she does have FBS OS off and on, states that she has hx of shingles OS, reports first dx in September of 2017; Pt reports that she is type 2 DM, states that she is currently taking insulin, states last CBG was 180, last A1C is unknown; Pt reports CBG is not stable; Pt reports she is taking Ilevro OS qd and Pred Acetate OS TID; Pt denies taking eye vits;  Pt states that she was placed on valacyclovir but 2 weeks  ago rx "ran out";      Last edited by Alyse Low on 10/08/2016 11:08 AM. (History)      Referring physician: Hortencia Pilar, MD [place], [place] [address]  HISTORICAL INFORMATION:   Selected notes from the MEDICAL RECORD NUMBER Referral from Dr. Read Drivers for r/o angiographic CME Ocular Hx - HZO OS; Pseudophakia OS (07/10/16); cellophane OS; HTN; PMH - DM II on insulin; Hx stroke;    CURRENT MEDICATIONS: Current Outpatient Prescriptions (Ophthalmic Drugs)  Medication Sig  . nepafenac (ILEVRO) 0.3 % ophthalmic suspension Place 1 drop into the left eye daily.  . prednisoLONE acetate (PRED MILD) 0.12 % ophthalmic suspension Place 1 drop into the left eye 4 (four) times daily.  Marland Kitchen ketorolac (ACULAR) 0.5 % ophthalmic solution Place 1 drop into the left eye 4 (four) times daily.  . polyvinyl alcohol (LIQUIFILM TEARS) 1.4 % ophthalmic solution Place 1 drop into both eyes as needed for dry eyes.   No current facility-administered medications for this visit.  (Ophthalmic Drugs)   Current Outpatient Prescriptions (Other)  Medication Sig  . aspirin EC 81 MG tablet Take 81 mg by mouth daily.  Marland Kitchen atorvastatin (LIPITOR) 40 MG tablet Take 40 mg by mouth daily.  . bacitracin ointment Apply topically 2 (two) times daily. Apply to the rash until resolved  . Calcium Carbonate-Vit D-Min (CALTRATE PLUS PO) Take 600 mg by mouth every morning.   Marland Kitchen  donepezil (ARICEPT) 10 MG tablet Take 10 mg by mouth at bedtime.   Marland Kitchen doxazosin (CARDURA) 2 MG tablet Take 4 mg by mouth at bedtime.   Marland Kitchen Epoetin Alfa (PROCRIT IJ) Inject 1 each as directed once a week.  . ezetimibe (ZETIA) 10 MG tablet Take 10 mg by mouth at bedtime.   . fluticasone (FLONASE) 50 MCG/ACT nasal spray PLACE TWO SPRAYS INTO BOTH NOSTRILS DAILY  . furosemide (LASIX) 80 MG tablet Take 1 tablet (80 mg total) by mouth daily.  Marland Kitchen gabapentin (NEURONTIN) 300 MG capsule Take 1 capsule (300 mg total) by mouth 2 (two) times  daily.  . hydrALAZINE (APRESOLINE) 100 MG tablet Take 100 mg by mouth 3 (three) times daily.  . isosorbide mononitrate (IMDUR) 120 MG 24 hr tablet Take 1 tablet (120 mg total) by mouth daily. PATIENT NEEDS TO CONTACT OFFICE FOR ADDITIONAL REFILLS  . KLOR-CON M20 20 MEQ tablet Take 20 mEq by mouth daily.   Marland Kitchen LEVEMIR FLEXTOUCH 100 UNIT/ML Pen Inject 15 Units as directed every morning.  . mometasone-formoterol (DULERA) 100-5 MCG/ACT AERO Inhale 2 puffs into the lungs 2 (two) times daily.  . montelukast (SINGULAIR) 10 MG tablet TAKE ONE (1) TABLET BY MOUTH EVERY DAY  . Multiple Vitamins-Minerals (MULTIVITAMIN GUMMIES ADULT) CHEW Chew 1 capsule by mouth daily.  . nitroGLYCERIN (NITROSTAT) 0.4 MG SL tablet PLACE ONE TABLET UNDER THE TONGUE EVERY FIVE MINUTES AS NEEDED FOR CHEST PAIN  . omeprazole (PRILOSEC) 20 MG capsule Take 1 capsule (20 mg total) by mouth 2 (two) times daily before a meal.  . omeprazole (PRILOSEC) 40 MG capsule TAKE ONE CAPSULE BY MOUTH DAILY  . ondansetron (ZOFRAN ODT) 4 MG disintegrating tablet Take 1 tablet (4 mg total) by mouth every 4 (four) hours as needed for nausea or vomiting.  . OXYGEN Inhale into the lungs. CPAP with oxygen at bedtime  . raloxifene (EVISTA) 60 MG tablet Take 60 mg by mouth every morning.   . SYMBICORT 160-4.5 MCG/ACT inhaler INHALE 2 PUFFS INTO THE LUNGS 2 TIMES DAILY  . traMADol (ULTRAM) 50 MG tablet Take 1 tablet (50 mg total) by mouth every 8 (eight) hours as needed for moderate pain or severe pain. Reported on 02/07/2015  . valACYclovir (VALTREX) 500 MG tablet Take 1 tablet (500 mg total) by mouth 3 (three) times daily.  . VENTOLIN HFA 108 (90 Base) MCG/ACT inhaler INHALE 2 PUFFS INTO THE LUNGS EVERY SIX HOURS AS NEEDED FOR SHORTNESSOF BREATH  . Vitamin D, Ergocalciferol, (DRISDOL) 50000 UNITS CAPS capsule Take 50,000 Units by mouth every Monday.    No current facility-administered medications for this visit.  (Other)      REVIEW OF SYSTEMS: ROS     Positive for: Musculoskeletal, Endocrine, Cardiovascular, Eyes, Respiratory   Negative for: Constitutional, Gastrointestinal, Neurological, Skin, Genitourinary, HENT, Psychiatric, Allergic/Imm, Heme/Lymph   Last edited by Alyse Low on 10/08/2016 10:03 AM. (History)       ALLERGIES Allergies  Allergen Reactions  . Morphine And Related Other (See Comments)    Family request not to be given, reports pt does not wake up when given   . Promethazine Hcl Other (See Comments)    REACTION: lethargy    PAST MEDICAL HISTORY Past Medical History:  Diagnosis Date  . Adenomatous colon polyp   . Allergy   . Anemia   . Asthma       . CAD (coronary artery disease)    Mild very minimal coronary disease with 20% obtuse marginal stenosis  .  Carpal tunnel syndrome on left   . CHF (congestive heart failure) ([place])   . Chronic kidney disease (CKD), stage III (moderate)   . COPD (chronic obstructive pulmonary disease) ([place])   . CVA (cerebral infarction)   . Depression   . 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 Diverticulosis   . 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 Esophageal dysmotility   . Esophageal stricture   . Gastritis   . Gastroparesis 08/21/2007  . GERD (gastroesophageal reflux disease)   . Hair loss   . Hearing loss of both ears   . Hernia, hiatal   . Hyperlipidemia   . Hypertension   . Morbid obesity ([place])   . Obesity   . Osteoarthritis   . OSTEOARTHRITIS 08/09/2006  . Osteoporosis   . 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 RENAL INSUFFICIENCY 02/16/2009  . 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 Seizures ([place])    pt thinks it has been several monthes since she had a seisure  . Shingles   . Sickle cell trait ([place])   . Stroke ([place])   . Tubular adenoma of colon    Past Surgical History:  Procedure Laterality Date  . ABDOMINAL HYSTERECTOMY    . ARTERY BIOPSY  01/07/2011   Procedure: MINOR BIOPSY TEMPORAL ARTERY;  Surgeon: Haywood Lasso,  MD;  Location: [facility];  Service: General;  Laterality: Left;  left temporal artery biopsy  . bil foot surgery    . BREAST LUMPECTOMY     benign  . BREAST LUMPECTOMY     both breast lumps removed   . CATARACT EXTRACTION Left 06/2016   Dr. Read Drivers  . COLONOSCOPY    . COLONOSCOPY WITH PROPOFOL N/A 07/05/2015   Procedure: COLONOSCOPY WITH PROPOFOL;  Surgeon: Manus Gunning, MD;  Location: [facility];  Service: Gastroenterology;  Laterality: N/A;  . ERD  08/08/2000  . ESOPHAGEAL MANOMETRY N/A 03/13/2015   Procedure: ESOPHAGEAL MANOMETRY (EM);  Surgeon: Manus Gunning, MD;  Location: [facility];  Service: Gastroenterology;  Laterality: N/A;  . ESOPHAGOGASTRODUODENOSCOPY  06/25/2006  . ESOPHAGOGASTRODUODENOSCOPY (EGD) WITH PROPOFOL N/A 07/05/2015   Procedure: ESOPHAGOGASTRODUODENOSCOPY (EGD) WITH PROPOFOL;  Surgeon: Manus Gunning, MD;  Location: [facility];  Service: Gastroenterology;  Laterality: N/A;  . HERNIA REPAIR    . LEFT AND RIGHT HEART CATHETERIZATION WITH CORONARY ANGIOGRAM N/A 03/03/2013   Procedure: LEFT AND RIGHT HEART CATHETERIZATION WITH CORONARY ANGIOGRAM;  Surgeon: Minus Breeding, MD;  Location: [facility] CATH LAB;  Service: Cardiovascular;  Laterality: N/A;  . REPLACEMENT TOTAL KNEE Left 1998  . UPPER GASTROINTESTINAL ENDOSCOPY      FAMILY HISTORY Family History  Problem Relation Age of Onset  . Liver cancer Mother        Liver Cancer  . Diabetes Mother   . Kidney disease Mother   . Heart disease Mother        age 38's  . Heart disease Father 32       MI  . Heart attack Father        died of MI when pt was 32  . Sickle cell anemia Father   . Colon cancer Brother   . Cancer Brother        Colon Cancer  . Diabetes Sister   . Kidney disease Sister   . Heart disease Sister        age 80's  . Allergies Sister   . Diabetes Sister   .  Kidney disease Sister   . Heart disease Sister        age 67's  . Esophageal cancer Neg Hx    . Rectal cancer Neg Hx   . Stomach cancer Neg Hx   . Amblyopia Neg Hx   . Blindness Neg Hx   . Glaucoma Neg Hx   . Macular degeneration Neg Hx   . Retinal detachment Neg Hx   . Cataracts Neg Hx   . Strabismus Neg Hx   . Retinitis pigmentosa Neg Hx     SOCIAL HISTORY Social History  Substance Use Topics  . Smoking status: Former Smoker    Packs/day: 0.50    Years: 10.00    Quit date: 04/18/1980  . Smokeless tobacco: Never Used  . Alcohol use No         OPHTHALMIC EXAM:  Base Eye Exam    Visual Acuity (Snellen - Linear)      Right Left   Dist cc 20/40 20/40   Dist ph cc NI NI   Correction:  Glasses       Tonometry (Tonopen, 10:22 AM)      Right Left   Pressure 16 14   Squeezing OU       Pupils      Dark Light Shape React APD   Right 3 2 Round Sluggish None   Left 3 2 Round Sluggish None       Visual Fields (Counting fingers)      Left Right    Full Full       Extraocular Movement      Right Left    Full, Ortho Full, Ortho       Neuro/Psych    Oriented x3:  Yes   Mood/Affect:  Normal       Dilation    Both eyes:  1.0% Mydriacyl, 2.5% Phenylephrine @ 10:22 AM        Slit Lamp and Fundus Exam    External Exam      Right Left   External Normal Normal       Slit Lamp Exam      Right Left   Lids/Lashes Dermatochalasis - upper lid Dermatochalasis - upper lid   Conjunctiva/Sclera melanosis  White and quiet   Cornea Clear well healed temporal insision   Anterior Chamber Deep and quiet Deep and half plus pigment   Iris Round and dilated; no NVI dilates to 4.25 mm; no NVI   Lens 2-3+ Nuclear sclerosis, 2+ Cortical cataract Posterior chamber intraocular lens in good position    Vitreous Vitreous syneresis Vitreous syneresis - mild       Fundus Exam      Right Left   Disc Normal Normal   C/D Ratio 0.45 0.5   Macula Flat; mild RPE change, trace MA Trace ERM; trace MA; mild RPE changes;    Vessels Vascular attenuation - mild; AV crossing  changes AV crossing changes; mild venous dilation    Periphery Attached attached; superior nasal blot hemes        Refraction    Wearing Rx      Sphere Cylinder Axis Add   Right -2.25 +1.25 005 +2.75   Left -2.25 +1.75 015 +2.75   Age:  20yr   Type:  PAL       Manifest Refraction      Sphere Cylinder Axis Dist VA   Right -2.00 +1.00 005 20/40   Left -2.00 +1.00 015 20/40  IMAGING AND PROCEDURES  Imaging and Procedures for 10/08/16  OCT, Retina - OU - Both Eyes     Right Eye Quality was good. Central Foveal Thickness: 244. Progression has no prior data. Findings include normal foveal contour, no IRF, no SRF.   Left Eye Quality was good. Central Foveal Thickness: 278. Progression has no prior data. Findings include normal foveal contour, no SRF, no IRF.   Notes Images taken, stored on drive   Diagnosis / Impression:  No macular edema, IRF, SRF OU  Clinical management:  See below  Abbreviations: NFP - Normal foveal profile. CME - cystoid macular edema. PED - pigment epithelial detachment. IRF - intraretinal fluid. SRF - subretinal fluid. EZ - ellipsoid zone. ERM - epiretinal membrane. ORA - outer retinal atrophy. ORT - outer retinal tubulation. SRHM - subretinal hyper-reflective material       Fluorescein Angiography Optos (Transit OS)     Right Eye Progression has no prior data. Early phase findings include microaneurysm. Mid/Late phase findings include microaneurysm.   Left Eye Progression has no prior data. Early phase findings include microaneurysm, leakage. Mid/Late phase findings include leakage, microaneurysm.   Notes OD: scattered, punctate areas of hyperfluorescence consistent with MAs OS: focal areas of hyperfluorescent leakage around macula and scattered throughout; scattered MAs              ASSESSMENT/PLAN:    ICD-10-CM   1. Retinal edema H35.81 OCT, Retina - OU - Both Eyes    Fluorescein Angiography Optos (Transit OS)  2. 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 Pseudophakia, left eye Z96.1   5. Senile nuclear sclerosis, right H25.11     1,2. Angiographic CME / focal chorioretinal inflammation OS  - no CME or IRF on OCT, however FA shows some focal areas of late leakage from disc, macula consistent with chorioretinal inflammation  - pt's vision is good at 20/40, but is symptomatic w/ complaints of flashes/floaters and intermittent visual disturbances  - etiology likely multifactorial with history of herpes zoster, CE/PCIOL 3 mos ago, and DM2  - with vision 20/40, would recommend restarting valtrex (500 mg po TID) and continue topical steroid and NSAID   Pt currently taking prednisolone acetate TID OS -- inc to QID OS   Pt currently taking Ilevro qdaily OS -- continue until sample runs out, then start ketorolac QID OS  - will f/u in 4 wks with possible repeat FA and consider subtenon's kenalog vs mild immunosuppression with methotrexate if inflammation persists  3. mild Non-Proliferative diabetic retinopathy, both eyes  - The incidence, risk factors for progression, natural history and treatment options for diabetic retinopathy  were discussed with patient.  The need for close monitoring of blood glucose, blood pressure, and serum lipids, avoiding cigarette or any type of tobacco, and the need for long term follow up was also discussed with patient.  - as above, may be contributing to inflammatory picture OS  4. Pseudophakia OS  - IOL in good position -- beautiful surgery by Dr. Kathlen Mody  - no frank CME on OCT, but mildly present on FA -- management as above  - will continue to monitor  5. Senile nuclear sclerosis OD  - The symptoms of cataract, surgical options, and treatments and risks were discussed with patient.  -  discussed diagnosis and progression  - under  the expert care of Dr. Kathlen Mody   - management per Dr. Kathlen Mody    Ophthalmic Meds Ordered this visit:  Meds ordered this encounter  Medications  . ketorolac (ACULAR) 0.5 % ophthalmic solution    Sig: Place 1 drop into the left eye 4 (four) times daily.    Dispense:  10 mL    Refill:  0  . valACYclovir (VALTREX) 500 MG tablet    Sig: Take 1 tablet (500 mg total) by mouth 3 (three) times daily.    Dispense:  90 tablet    Refill:  1       Return in about 4 weeks (around 11/05/2016).  There are no Patient Instructions on file for this visit.   Explained the diagnoses, plan, and follow up with the patient and they expressed understanding.  Patient expressed understanding of the importance of proper follow up care.   Gardiner Sleeper, M.D., Ph.D. Vitreoretinal Surgeon [facility] 10/08/16     Abbreviations: M myopia (nearsighted); A astigmatism; H hyperopia (farsighted); P presbyopia; Mrx spectacle prescription;  CTL contact lenses; OD right eye; OS left eye; OU both eyes  XT exotropia; ET esotropia; PEK punctate epithelial keratitis; PEE punctate epithelial erosions; DES dry eye syndrome; MGD meibomian gland dysfunction; ATs artificial tears; PFAT's preservative free artificial tears; [place] nuclear sclerotic cataract; PSC posterior subcapsular cataract; ERM epi-retinal membrane; PVD posterior vitreous detachment; RD retinal detachment; DM diabetes mellitus; DR diabetic retinopathy; NPDR non-proliferative diabetic retinopathy; PDR proliferative diabetic retinopathy; CSME clinically significant macular edema; DME diabetic macular edema; dbh dot blot hemorrhages; CWS cotton wool spot; POAG primary open angle glaucoma; C/D cup-to-disc ratio; HVF humphrey visual field; GVF goldmann visual field; OCT optical coherence tomography; IOP intraocular pressure; BRVO Branch retinal vein occlusion; CRVO central retinal vein occlusion;  CRAO central retinal artery occlusion; BRAO branch retinal artery occlusion; RT retinal tear; SB scleral buckle; PPV pars plana vitrectomy; VH Vitreous hemorrhage; PRP panretinal laser photocoagulation; IVK intravitreal kenalog; VMT vitreomacular traction; MH Macular hole;  NVD neovascularization of the disc; NVE neovascularization elsewhere; AREDS age related eye disease study; ARMD age related macular degeneration; POAG primary open angle glaucoma; EBMD epithelial/anterior basement membrane dystrophy; ACIOL anterior chamber intraocular lens; IOL intraocular lens; PCIOL posterior chamber intraocular lens; Phaco/IOL phacoemulsification with intraocular lens placement; [place] photorefractive keratectomy; LASIK laser assisted in situ keratomileusis; HTN hypertension; DM diabetes mellitus; COPD chronic obstructive pulmonary disease

## 2016-10-08 ENCOUNTER — Ambulatory Visit (INDEPENDENT_AMBULATORY_CARE_PROVIDER_SITE_OTHER): Payer: Medicare Other | Admitting: Ophthalmology

## 2016-10-08 ENCOUNTER — Encounter (INDEPENDENT_AMBULATORY_CARE_PROVIDER_SITE_OTHER): Payer: Self-pay | Admitting: Ophthalmology

## 2016-10-08 DIAGNOSIS — Z961 Presence of intraocular lens: Secondary | ICD-10-CM | POA: Diagnosis not present

## 2016-10-08 DIAGNOSIS — E113293 Type 2 diabetes mellitus with mild nonproliferative diabetic retinopathy without macular edema, bilateral: Secondary | ICD-10-CM

## 2016-10-08 DIAGNOSIS — H30002 Unspecified focal chorioretinal inflammation, left eye: Secondary | ICD-10-CM

## 2016-10-08 DIAGNOSIS — H3581 Retinal edema: Secondary | ICD-10-CM | POA: Diagnosis not present

## 2016-10-08 DIAGNOSIS — H2511 Age-related nuclear cataract, right eye: Secondary | ICD-10-CM

## 2016-10-08 MED ORDER — VALACYCLOVIR HCL 500 MG PO TABS: 500.0000 mg | ORAL_TABLET | Freq: Three times a day (TID) | ORAL | 1 refills | Status: DC

## 2016-10-08 MED ORDER — KETOROLAC TROMETHAMINE 0.5 % OP SOLN: 1.0000 [drp] | Freq: Four times a day (QID) | OPHTHALMIC | 0 refills | Status: DC

## 2016-10-10 DIAGNOSIS — I10 Essential (primary) hypertension: Secondary | ICD-10-CM | POA: Diagnosis not present

## 2016-10-10 DIAGNOSIS — E889 Metabolic disorder, unspecified: Secondary | ICD-10-CM | POA: Diagnosis not present

## 2016-10-10 DIAGNOSIS — E669 Obesity, unspecified: Secondary | ICD-10-CM | POA: Diagnosis not present

## 2016-10-10 DIAGNOSIS — Z23 Encounter for immunization: Secondary | ICD-10-CM | POA: Diagnosis not present

## 2016-10-10 DIAGNOSIS — D638 Anemia in other chronic diseases classified elsewhere: Secondary | ICD-10-CM | POA: Diagnosis not present

## 2016-10-10 DIAGNOSIS — N183 Chronic kidney disease, stage 3 (moderate): Secondary | ICD-10-CM | POA: Diagnosis not present

## 2016-10-10 DIAGNOSIS — M9080 Osteopathy in diseases classified elsewhere, unspecified site: Secondary | ICD-10-CM | POA: Diagnosis not present

## 2016-10-11 ENCOUNTER — Encounter (HOSPITAL_COMMUNITY)
Admission: RE | Admit: 2016-10-11 | Discharge: 2016-10-11 | Disposition: A | Discharge status: Home / Self Care | Payer: Medicare Other | Source: Ambulatory Visit | Attending: Nephrology | Admitting: Nephrology

## 2016-10-11 DIAGNOSIS — D631 Anemia in chronic kidney disease: Secondary | ICD-10-CM | POA: Diagnosis not present

## 2016-10-11 DIAGNOSIS — N183 Chronic kidney disease, stage 3 unspecified: Secondary | ICD-10-CM

## 2016-10-11 LAB — POCT HEMOGLOBIN-HEMACUE: Hemoglobin: 10.9 g/dL — ABNORMAL LOW (ref 12.0–15.0)

## 2016-10-11 MED ORDER — EPOETIN ALFA 20000 UNIT/ML IJ SOLN
INTRAMUSCULAR | Status: AC
  Administered 2016-10-11: 20000 [IU] via SUBCUTANEOUS
  Filled 2016-10-11: qty 1

## 2016-10-11 MED ORDER — EPOETIN ALFA 10000 UNIT/ML IJ SOLN
INTRAMUSCULAR | Status: AC
  Administered 2016-10-11: 11:00:00 10000 [IU] via SUBCUTANEOUS
  Filled 2016-10-11: qty 1

## 2016-10-11 MED ORDER — EPOETIN ALFA 40000 UNIT/ML IJ SOLN: 30000.0000 [IU] | INTRAMUSCULAR | Status: DC

## 2016-10-24 ENCOUNTER — Emergency Department (HOSPITAL_COMMUNITY): Payer: Medicare Other

## 2016-10-24 ENCOUNTER — Encounter (HOSPITAL_COMMUNITY): Payer: Self-pay | Admitting: Emergency Medicine

## 2016-10-24 ENCOUNTER — Emergency Department (HOSPITAL_COMMUNITY)
Admission: EM | Admit: 2016-10-24 | Discharge: 2016-10-24 | Disposition: A | Discharge status: Home / Self Care | Payer: Medicare Other | Attending: Emergency Medicine | Admitting: Emergency Medicine

## 2016-10-24 DIAGNOSIS — S7001XA Contusion of right hip, initial encounter: Secondary | ICD-10-CM | POA: Diagnosis not present

## 2016-10-24 DIAGNOSIS — Z79899 Other long term (current) drug therapy: Secondary | ICD-10-CM | POA: Diagnosis not present

## 2016-10-24 DIAGNOSIS — I2510 Atherosclerotic heart disease of native coronary artery without angina pectoris: Secondary | ICD-10-CM | POA: Insufficient documentation

## 2016-10-24 DIAGNOSIS — R079 Chest pain, unspecified: Secondary | ICD-10-CM | POA: Diagnosis not present

## 2016-10-24 DIAGNOSIS — E785 Hyperlipidemia, unspecified: Secondary | ICD-10-CM | POA: Insufficient documentation

## 2016-10-24 DIAGNOSIS — I130 Hypertensive heart and chronic kidney disease with heart failure and stage 1 through stage 4 chronic kidney disease, or unspecified chronic kidney disease: Secondary | ICD-10-CM | POA: Insufficient documentation

## 2016-10-24 DIAGNOSIS — Y939 Activity, unspecified: Secondary | ICD-10-CM | POA: Diagnosis not present

## 2016-10-24 DIAGNOSIS — Z87891 Personal history of nicotine dependence: Secondary | ICD-10-CM | POA: Diagnosis not present

## 2016-10-24 DIAGNOSIS — M25462 Effusion, left knee: Secondary | ICD-10-CM | POA: Diagnosis not present

## 2016-10-24 DIAGNOSIS — M25551 Pain in right hip: Secondary | ICD-10-CM | POA: Diagnosis not present

## 2016-10-24 DIAGNOSIS — Z8673 Personal history of transient ischemic attack (TIA), and cerebral infarction without residual deficits: Secondary | ICD-10-CM | POA: Insufficient documentation

## 2016-10-24 DIAGNOSIS — R519 Headache, unspecified: Secondary | ICD-10-CM

## 2016-10-24 DIAGNOSIS — Y998 Other external cause status: Secondary | ICD-10-CM | POA: Insufficient documentation

## 2016-10-24 DIAGNOSIS — J449 Chronic obstructive pulmonary disease, unspecified: Secondary | ICD-10-CM | POA: Insufficient documentation

## 2016-10-24 DIAGNOSIS — R51 Headache: Secondary | ICD-10-CM | POA: Diagnosis not present

## 2016-10-24 DIAGNOSIS — N183 Chronic kidney disease, stage 3 (moderate): Secondary | ICD-10-CM | POA: Insufficient documentation

## 2016-10-24 DIAGNOSIS — S199XXA Unspecified injury of neck, initial encounter: Secondary | ICD-10-CM | POA: Diagnosis not present

## 2016-10-24 DIAGNOSIS — I509 Heart failure, unspecified: Secondary | ICD-10-CM | POA: Insufficient documentation

## 2016-10-24 DIAGNOSIS — M542 Cervicalgia: Secondary | ICD-10-CM | POA: Diagnosis not present

## 2016-10-24 DIAGNOSIS — E119 Type 2 diabetes mellitus without complications: Secondary | ICD-10-CM | POA: Insufficient documentation

## 2016-10-24 DIAGNOSIS — S161XXA Strain of muscle, fascia and tendon at neck level, initial encounter: Secondary | ICD-10-CM | POA: Diagnosis not present

## 2016-10-24 DIAGNOSIS — Z7982 Long term (current) use of aspirin: Secondary | ICD-10-CM | POA: Insufficient documentation

## 2016-10-24 DIAGNOSIS — Y92410 Unspecified street and highway as the place of occurrence of the external cause: Secondary | ICD-10-CM | POA: Diagnosis not present

## 2016-10-24 DIAGNOSIS — S0990XA Unspecified injury of head, initial encounter: Secondary | ICD-10-CM | POA: Diagnosis not present

## 2016-10-24 DIAGNOSIS — S79911A Unspecified injury of right hip, initial encounter: Secondary | ICD-10-CM | POA: Diagnosis not present

## 2016-10-24 MED ORDER — ACETAMINOPHEN 325 MG PO TABS
650.0000 mg | ORAL_TABLET | Freq: Once | ORAL | Status: AC
  Administered 2016-10-24: 650 mg via ORAL
  Filled 2016-10-24: qty 2
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
## 2016-10-24 NOTE — ED Notes (Signed)
PA at bedside.
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
## 2016-10-25 ENCOUNTER — Encounter (HOSPITAL_COMMUNITY)
Admission: RE | Admit: 2016-10-25 | Discharge: 2016-10-25 | Disposition: A | Discharge status: Home / Self Care | Payer: Medicare Other | Source: Ambulatory Visit | Attending: Nephrology | Admitting: Nephrology

## 2016-10-25 DIAGNOSIS — N183 Chronic kidney disease, stage 3 unspecified: Secondary | ICD-10-CM

## 2016-10-25 DIAGNOSIS — D631 Anemia in chronic kidney disease: Secondary | ICD-10-CM | POA: Insufficient documentation

## 2016-10-25 LAB — IRON AND TIBC
IRON: 92 ug/dL (ref 28–170)
Saturation Ratios: 45 % — ABNORMAL HIGH (ref 10.4–31.8)
TIBC: 203 ug/dL — ABNORMAL LOW (ref 250–450)
UIBC: 111 ug/dL

## 2016-10-25 LAB — FERRITIN: FERRITIN: 459 ng/mL — AB (ref 11–307)

## 2016-10-25 MED ORDER — EPOETIN ALFA 20000 UNIT/ML IJ SOLN
INTRAMUSCULAR | Status: AC
  Administered 2016-10-25: 11:00:00 20000 [IU] via SUBCUTANEOUS
  Filled 2016-10-25: qty 1

## 2016-10-25 MED ORDER — EPOETIN ALFA 10000 UNIT/ML IJ SOLN
INTRAMUSCULAR | Status: AC
  Administered 2016-10-25: 10000 [IU] via SUBCUTANEOUS
  Filled 2016-10-25: qty 1

## 2016-10-25 MED ORDER — EPOETIN ALFA 40000 UNIT/ML IJ SOLN: 30000.0000 [IU] | INTRAMUSCULAR | Status: DC

## 2016-10-26 LAB — PTH, INTACT AND CALCIUM
CALCIUM TOTAL (PTH): 8.8 mg/dL (ref 8.7–10.3)
PTH: 127 pg/mL — AB (ref 15–65)

## 2016-10-29 DIAGNOSIS — H43391 Other vitreous opacities, right eye: Secondary | ICD-10-CM | POA: Diagnosis not present

## 2016-10-29 DIAGNOSIS — H5711 Ocular pain, right eye: Secondary | ICD-10-CM | POA: Diagnosis not present

## 2016-10-29 LAB — POCT HEMOGLOBIN-HEMACUE: HEMOGLOBIN: 10.3 g/dL — AB (ref 12.0–15.0)

## 2016-10-30 DIAGNOSIS — M25561 Pain in right knee: Secondary | ICD-10-CM | POA: Diagnosis not present

## 2016-10-31 ENCOUNTER — Encounter: Payer: Self-pay | Admitting: Pulmonary Disease

## 2016-10-31 ENCOUNTER — Ambulatory Visit (INDEPENDENT_AMBULATORY_CARE_PROVIDER_SITE_OTHER): Payer: Medicare Other | Admitting: Pulmonary Disease

## 2016-10-31 VITALS — BP 134/80 | HR 70 | Ht 64.0 in | Wt 259.0 lb

## 2016-10-31 DIAGNOSIS — J449 Chronic obstructive pulmonary disease, unspecified: Secondary | ICD-10-CM | POA: Diagnosis not present

## 2016-10-31 DIAGNOSIS — J986 Disorders of diaphragm: Secondary | ICD-10-CM

## 2016-10-31 DIAGNOSIS — J9611 Chronic respiratory failure with hypoxia: Secondary | ICD-10-CM | POA: Diagnosis not present

## 2016-10-31 DIAGNOSIS — R05 Cough: Secondary | ICD-10-CM | POA: Diagnosis not present

## 2016-10-31 DIAGNOSIS — R058 Other specified cough: Secondary | ICD-10-CM

## 2016-10-31 MED ORDER — GUAIFENESIN ER 600 MG PO TB12: 1200.0000 mg | ORAL_TABLET | Freq: Two times a day (BID) | ORAL | Status: DC | PRN
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
## 2016-10-31 NOTE — Patient Instructions (Signed)
Can use mucinex twice per day as needed to help loosen phlegm in your chest  Dulera two puffs twice per day and rinse mouth after each use  Singulair 10 mg pill nightly  Ventolin two puffs every 6 hours as needed for cough, wheeze, or chest congestion  Follow up in 6 months
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
## 2016-11-05 ENCOUNTER — Encounter (INDEPENDENT_AMBULATORY_CARE_PROVIDER_SITE_OTHER): Payer: Self-pay | Admitting: Ophthalmology

## 2016-11-05 ENCOUNTER — Ambulatory Visit (INDEPENDENT_AMBULATORY_CARE_PROVIDER_SITE_OTHER): Payer: Medicare Other | Admitting: Ophthalmology

## 2016-11-05 DIAGNOSIS — H30002 Unspecified focal chorioretinal inflammation, left eye: Secondary | ICD-10-CM | POA: Diagnosis not present

## 2016-11-05 DIAGNOSIS — E113293 Type 2 diabetes mellitus with mild nonproliferative diabetic retinopathy without macular edema, bilateral: Secondary | ICD-10-CM | POA: Diagnosis not present

## 2016-11-05 DIAGNOSIS — H3581 Retinal edema: Secondary | ICD-10-CM | POA: Diagnosis not present

## 2016-11-05 DIAGNOSIS — Z961 Presence of intraocular lens: Secondary | ICD-10-CM

## 2016-11-05 DIAGNOSIS — H2511 Age-related nuclear cataract, right eye: Secondary | ICD-10-CM | POA: Diagnosis not present

## 2016-11-08 ENCOUNTER — Encounter (HOSPITAL_COMMUNITY)
Admission: RE | Admit: 2016-11-08 | Discharge: 2016-11-08 | Disposition: A | Discharge status: Home / Self Care | Payer: Medicare Other | Source: Ambulatory Visit | Attending: Nephrology | Admitting: Nephrology

## 2016-11-08 DIAGNOSIS — D631 Anemia in chronic kidney disease: Secondary | ICD-10-CM | POA: Diagnosis not present

## 2016-11-08 DIAGNOSIS — N183 Chronic kidney disease, stage 3 unspecified: Secondary | ICD-10-CM

## 2016-11-08 LAB — RENAL FUNCTION PANEL
Albumin: 3.3 g/dL — ABNORMAL LOW (ref 3.5–5.0)
Anion gap: 8 (ref 5–15)
BUN: 35 mg/dL — ABNORMAL HIGH (ref 6–20)
CALCIUM: 9 mg/dL (ref 8.9–10.3)
CHLORIDE: 105 mmol/L (ref 101–111)
CO2: 29 mmol/L (ref 22–32)
CREATININE: 1.46 mg/dL — AB (ref 0.44–1.00)
GFR calc Af Amer: 41 mL/min — ABNORMAL LOW (ref >60)
GFR calc non Af Amer: 35 mL/min — ABNORMAL LOW (ref >60)
GLUCOSE: 276 mg/dL — AB (ref 65–99)
Phosphorus: 3.3 mg/dL (ref 2.5–4.6)
Potassium: 3.8 mmol/L (ref 3.5–5.1)
SODIUM: 142 mmol/L (ref 135–145)

## 2016-11-08 LAB — POCT HEMOGLOBIN-HEMACUE: Hemoglobin: 11.3 g/dL — ABNORMAL LOW (ref 12.0–15.0)

## 2016-11-08 MED ORDER — EPOETIN ALFA 20000 UNIT/ML IJ SOLN
INTRAMUSCULAR | Status: AC
  Administered 2016-11-08: 11:00:00 20000 [IU]
  Filled 2016-11-08: qty 1

## 2016-11-08 MED ORDER — EPOETIN ALFA 40000 UNIT/ML IJ SOLN: 30000.0000 [IU] | INTRAMUSCULAR | Status: DC

## 2016-11-08 MED ORDER — EPOETIN ALFA 10000 UNIT/ML IJ SOLN
INTRAMUSCULAR | Status: AC
  Administered 2016-11-08: 10000 [IU]
  Filled 2016-11-08: qty 1

## 2016-11-21 ENCOUNTER — Other Ambulatory Visit (HOSPITAL_COMMUNITY): Payer: Self-pay | Admitting: *Deleted

## 2016-11-22 ENCOUNTER — Encounter (HOSPITAL_COMMUNITY)
Admission: RE | Admit: 2016-11-22 | Discharge: 2016-11-22 | Disposition: A | Discharge status: Home / Self Care | Payer: Medicare Other | Source: Ambulatory Visit | Attending: Nephrology | Admitting: Nephrology

## 2016-11-22 DIAGNOSIS — N183 Chronic kidney disease, stage 3 unspecified: Secondary | ICD-10-CM

## 2016-11-22 DIAGNOSIS — D631 Anemia in chronic kidney disease: Secondary | ICD-10-CM | POA: Insufficient documentation

## 2016-11-22 LAB — IRON AND TIBC
IRON: 97 ug/dL (ref 28–170)
Saturation Ratios: 50 % — ABNORMAL HIGH (ref 10.4–31.8)
TIBC: 193 ug/dL — ABNORMAL LOW (ref 250–450)
UIBC: 96 ug/dL

## 2016-11-22 LAB — POCT HEMOGLOBIN-HEMACUE: Hemoglobin: 10.2 g/dL — ABNORMAL LOW (ref 12.0–15.0)

## 2016-11-22 LAB — FERRITIN: FERRITIN: 460 ng/mL — AB (ref 11–307)

## 2016-11-22 MED ORDER — EPOETIN ALFA 10000 UNIT/ML IJ SOLN
INTRAMUSCULAR | Status: AC
  Administered 2016-11-22: 11:00:00 10000 [IU]
  Filled 2016-11-22: qty 1

## 2016-11-22 MED ORDER — EPOETIN ALFA 40000 UNIT/ML IJ SOLN: 30000.0000 [IU] | INTRAMUSCULAR | Status: DC

## 2016-11-22 MED ORDER — EPOETIN ALFA 20000 UNIT/ML IJ SOLN
INTRAMUSCULAR | Status: AC
  Administered 2016-11-22: 20000 [IU]
  Filled 2016-11-22: qty 1

## 2016-11-23 LAB — PTH, INTACT AND CALCIUM
Calcium, Total (PTH): 8.5 mg/dL — ABNORMAL LOW (ref 8.7–10.3)
PTH: 133 pg/mL — AB (ref 15–65)

## 2016-11-27 ENCOUNTER — Other Ambulatory Visit: Payer: Self-pay | Admitting: Pulmonary Disease

## 2016-11-28 ENCOUNTER — Ambulatory Visit: Payer: Medicare Other | Admitting: Podiatry

## 2016-12-03 NOTE — Progress Notes (Deleted)
[facility] Note  12/04/2016     CHIEF COMPLAINT Patient presents for No chief complaint on file.   HISTORY OF PRESENT ILLNESS: Joann Gutierrez is a 71 y.o. female who presents to the clinic today for:     Referring physician: Seward Carol, MD [address], [place] [address]  HISTORICAL INFORMATION:   Selected notes from the MEDICAL RECORD NUMBER Referral from Dr. Read Drivers for r/o angiographic CME Ocular Hx - HZO OS; Pseudophakia OS (07/10/16); cellophane OS; HTN; PMH - DM II on insulin; Hx stroke;    CURRENT MEDICATIONS: Current Outpatient Medications (Ophthalmic Drugs)  Medication Sig   ketorolac (ACULAR) 0.5 % ophthalmic solution Place 1 drop into the left eye 4 (four) times daily.   nepafenac (ILEVRO) 0.3 % ophthalmic suspension Place 1 drop into the left eye daily.   polyvinyl alcohol (LIQUIFILM TEARS) 1.4 % ophthalmic solution Place 1 drop into both eyes as needed for dry eyes.   prednisoLONE acetate (PRED MILD) 0.12 % ophthalmic suspension Place 1 drop into the left eye 4 (four) times daily.   No current facility-administered medications for this visit.  (Ophthalmic Drugs)   Current Outpatient Medications (Other)  Medication Sig   aspirin EC 81 MG tablet Take 81 mg by mouth daily.   atorvastatin (LIPITOR) 40 MG tablet Take 40 mg by mouth daily.   bacitracin ointment Apply topically 2 (two) times daily. Apply to the rash until resolved   Calcium Carbonate-Vit D-Min (CALTRATE PLUS PO) Take 600 mg by mouth every morning.    donepezil (ARICEPT) 10 MG tablet Take 10 mg by mouth at bedtime.    doxazosin (CARDURA) 2 MG tablet Take 4 mg by mouth at bedtime.    Epoetin Alfa (PROCRIT IJ) Inject 1 each as directed once a week.   ezetimibe (ZETIA) 10 MG tablet Take 10 mg by mouth at bedtime.    fluticasone (FLONASE) 50 MCG/ACT nasal spray PLACE TWO SPRAYS INTO BOTH NOSTRILS DAILY   furosemide (LASIX) 80  MG tablet Take 1 tablet (80 mg total) by mouth daily.   gabapentin (NEURONTIN) 300 MG capsule Take 1 capsule (300 mg total) by mouth 2 (two) times daily.   guaiFENesin (MUCINEX) 600 MG 12 hr tablet Take 2 tablets (1,200 mg total) by mouth 2 (two) times daily as needed for cough or to loosen phlegm.   hydrALAZINE (APRESOLINE) 100 MG tablet Take 100 mg by mouth 3 (three) times daily.   isosorbide mononitrate (IMDUR) 120 MG 24 hr tablet Take 1 tablet (120 mg total) by mouth daily. PATIENT NEEDS TO CONTACT OFFICE FOR ADDITIONAL REFILLS   KLOR-CON M20 20 MEQ tablet Take 20 mEq by mouth daily.    LEVEMIR FLEXTOUCH 100 UNIT/ML Pen Inject 15 Units as directed every morning.   mometasone-formoterol (DULERA) 100-5 MCG/ACT AERO Inhale 2 puffs into the lungs 2 (two) times daily.   montelukast (SINGULAIR) 10 MG tablet TAKE ONE TABLET BY MOUTH EVERY DAY.   montelukast (SINGULAIR) 10 MG tablet TAKE ONE TABLET BY MOUTH EVERY DAY.   Multiple Vitamins-Minerals (MULTIVITAMIN GUMMIES ADULT) CHEW Chew 1 capsule by mouth daily.   nitroGLYCERIN (NITROSTAT) 0.4 MG SL tablet PLACE ONE TABLET UNDER THE TONGUE EVERY FIVE MINUTES AS NEEDED FOR CHEST PAIN   omeprazole (PRILOSEC) 20 MG capsule Take 1 capsule (20 mg total) by mouth 2 (two) times daily before a meal.   omeprazole (PRILOSEC) 40 MG capsule TAKE ONE CAPSULE BY MOUTH DAILY  ondansetron (ZOFRAN ODT) 4 MG disintegrating tablet Take 1 tablet (4 mg total) by mouth every 4 (four) hours as needed for nausea or vomiting.   OXYGEN Inhale into the lungs. CPAP with oxygen at bedtime   raloxifene (EVISTA) 60 MG tablet Take 60 mg by mouth every morning.    traMADol (ULTRAM) 50 MG tablet Take 1 tablet (50 mg total) by mouth every 8 (eight) hours as needed for moderate pain or severe pain. Reported on 02/07/2015   valACYclovir (VALTREX) 500 MG tablet Take 1 tablet (500 mg total) by mouth 3 (three) times daily.   VENTOLIN HFA 108 (90 Base) MCG/ACT inhaler INHALE  2 PUFFS INTO THE LUNGS EVERY SIX HOURS AS NEEDED FOR SHORTNESSOF BREATH   Vitamin D, Ergocalciferol, (DRISDOL) 50000 UNITS CAPS capsule Take 50,000 Units by mouth every Monday.    No current facility-administered medications for this visit.  (Other)      REVIEW OF SYSTEMS:    ALLERGIES Allergies  Allergen Reactions   Morphine And Related Other (See Comments)    Family request not to be given, reports pt does not wake up when given    Promethazine Hcl Other (See Comments)    REACTION: lethargy    PAST MEDICAL HISTORY Past Medical History:  Diagnosis Date   Adenomatous colon polyp    Allergy    Anemia    Asthma        CAD (coronary artery disease)    Mild very minimal coronary disease with 20% obtuse marginal stenosis   Carpal tunnel syndrome on left    CHF (congestive heart failure) (HCC)    Chronic kidney disease (CKD), stage III (moderate) (HCC)    COPD (chronic obstructive pulmonary disease) (HCC)    CVA (cerebral infarction)    Depression    Diabetes mellitus 1997   Type II    Diverticulosis    Elevated diaphragm November 2011   Right side   Esophageal dysmotility    Esophageal stricture    Gastritis    Gastroparesis 08/21/2007   GERD (gastroesophageal reflux disease)    Hair loss    Hearing loss of both ears    Hernia, hiatal    Hyperlipidemia    Hypertension    Morbid obesity ([place])    Obesity    Osteoarthritis    OSTEOARTHRITIS 08/09/2006   Osteoporosis    PERIPHERAL NEUROPATHY, FEET 09/23/2007   RENAL INSUFFICIENCY 02/16/2009   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 Read Drivers   COLONOSCOPY     ERD  08/08/2000   ESOPHAGOGASTRODUODENOSCOPY  06/25/2006   HERNIA REPAIR     REPLACEMENT TOTAL KNEE Left 1998   UPPER GASTROINTESTINAL ENDOSCOPY      FAMILY HISTORY Family History  Problem Relation Age of Onset   Liver cancer Mother        Liver Cancer   Diabetes Mother    Kidney disease Mother    Heart disease Mother  age 84's   Heart disease Father 45       MI   Heart attack Father        died of MI when pt was 5   Sickle cell anemia Father    Colon cancer Brother    Cancer Brother        Colon Cancer   Diabetes Sister    Kidney disease Sister    Heart disease Sister        age 1's   Allergies Sister    Diabetes Sister    Kidney disease Sister    Heart disease Sister        age 42's   Esophageal cancer Neg Hx    Rectal cancer Neg Hx    Stomach cancer Neg Hx    Amblyopia Neg Hx    Blindness Neg Hx    Glaucoma Neg Hx    Macular degeneration Neg Hx    Retinal detachment Neg Hx    Cataracts Neg Hx    Strabismus Neg Hx    Retinitis pigmentosa Neg Hx     SOCIAL HISTORY Social History   Tobacco Use   Smoking status: Former Smoker    Packs/day: 0.50    Years: 10.00    Pack years: 5.00    Last attempt to quit: 04/18/1980    Years since quitting: 36.6   Smokeless tobacco: Never Used  Substance Use Topics   Alcohol use: No    Alcohol/week: 0.0 oz   Drug use: No         OPHTHALMIC EXAM:   Not recorded      IMAGING AND PROCEDURES  Imaging and Procedures for 12/03/16           ASSESSMENT/PLAN:    ICD-10-CM   1. 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 Retinal edema H35.81   3. Mild nonproliferative diabetic retinopathy of both eyes without macular edema associated with type 2 diabetes mellitus ([place]) G25.4270   4. Pseudophakia, left eye Z96.1   5. Senile nuclear  sclerosis, right H25.11     1,2. Angiographic CME / focal chorioretinal inflammation OS  - no CME or IRF on OCT, however FA shows some focal areas of late leakage from disc, macula consistent with chorioretinal inflammation  - pt's vision is relatively stable at 20/40, but is symptomatic w/ complaints of flashes/floaters and intermittent visual disturbances  - etiology likely multifactorial with history of herpes zoster, CE/PCIOL 4 mos ago, and DM2  - currently on po valtrex (500 mg po TID), prednisolone acetate, and ketorolac QID OS  - repeat FA on 11/05/16 showed mild improvement in angiographic leakage  - discussed treatment options -- recommend continuation of present management as pt is having subjective improvement in symptoms as well as objective improvement on repeat FA  - consider subtenon's kenalog vs mild immunosuppression with methotrexate if inflammation persists  3. mild Non-Proliferative diabetic retinopathy, both eyes  - The incidence, risk factors for progression, natural history and treatment options for diabetic retinopathy  were discussed with patient.  The need for close monitoring of blood glucose, blood pressure, and serum lipids, avoiding cigarette or any type of tobacco, and the need for long term follow up was also discussed with patient.  - as above, may be contributing to inflammatory picture OS  4. Pseudophakia OS  - IOL in good position -- beautiful surgery by Dr. Kathlen Mody  - no frank CME on OCT,  but mildly present on FA -- management as above  - will continue to monitor  5. Senile nuclear sclerosis OD  - The symptoms of cataract, surgical options, and treatments and risks were discussed with patient.  - discussed diagnosis and progression  - under the expert care of Dr. Kathlen Mody   - management per Dr. Kathlen Mody    Ophthalmic Meds Ordered this visit:  No orders of the defined types were placed in this encounter.      No Follow-up on file.  There are no  Patient Instructions on file for this visit.   Explained the diagnoses, plan, and follow up with the patient and they expressed understanding.  Patient expressed understanding of the importance of proper follow up care.   Gardiner Sleeper, M.D., Ph.D. Diseases & Surgery of the Retina and Vitreous [facility] 12/03/16      Abbreviations: M myopia (nearsighted); A astigmatism; H hyperopia (farsighted); P presbyopia; Mrx spectacle prescription;  CTL contact lenses; OD right eye; OS left eye; OU both eyes  XT exotropia; ET esotropia; PEK punctate epithelial keratitis; PEE punctate epithelial erosions; DES dry eye syndrome; MGD meibomian gland dysfunction; ATs artificial tears; PFAT's preservative free artificial tears; [place] nuclear sclerotic cataract; PSC posterior subcapsular cataract; ERM epi-retinal membrane; PVD posterior vitreous detachment; RD retinal detachment; DM diabetes mellitus; DR diabetic retinopathy; NPDR non-proliferative diabetic retinopathy; PDR proliferative diabetic retinopathy; CSME clinically significant macular edema; DME diabetic macular edema; dbh dot blot hemorrhages; CWS cotton wool spot; POAG primary open angle glaucoma; C/D cup-to-disc ratio; HVF humphrey visual field; GVF goldmann visual field; OCT optical coherence tomography; IOP intraocular pressure; BRVO Branch retinal vein occlusion; CRVO central retinal vein occlusion; CRAO central retinal artery occlusion; BRAO branch retinal artery occlusion; RT retinal tear; SB scleral buckle; PPV pars plana vitrectomy; VH Vitreous hemorrhage; PRP panretinal laser photocoagulation; IVK intravitreal kenalog; VMT vitreomacular traction; MH Macular hole;  NVD neovascularization of the disc; NVE neovascularization elsewhere; AREDS age related eye disease study; ARMD age related macular degeneration; POAG primary open angle glaucoma; EBMD epithelial/anterior basement membrane dystrophy; ACIOL anterior chamber  intraocular lens; IOL intraocular lens; PCIOL posterior chamber intraocular lens; Phaco/IOL phacoemulsification with intraocular lens placement; [place] photorefractive keratectomy; LASIK laser assisted in situ keratomileusis; HTN hypertension; DM diabetes mellitus; COPD chronic obstructive pulmonary disease

## 2016-12-04 ENCOUNTER — Encounter (INDEPENDENT_AMBULATORY_CARE_PROVIDER_SITE_OTHER): Payer: Medicare Other | Admitting: Ophthalmology

## 2016-12-06 ENCOUNTER — Encounter (HOSPITAL_COMMUNITY)
Admission: RE | Admit: 2016-12-06 | Discharge: 2016-12-06 | Disposition: A | Discharge status: Home / Self Care | Payer: Medicare Other | Source: Ambulatory Visit | Attending: Nephrology | Admitting: Nephrology

## 2016-12-06 VITALS — BP 176/90 | HR 65 | Resp 18

## 2016-12-06 DIAGNOSIS — N183 Chronic kidney disease, stage 3 unspecified: Secondary | ICD-10-CM

## 2016-12-06 DIAGNOSIS — D631 Anemia in chronic kidney disease: Secondary | ICD-10-CM | POA: Diagnosis not present

## 2016-12-06 LAB — RENAL FUNCTION PANEL
ALBUMIN: 3 g/dL — AB (ref 3.5–5.0)
Anion gap: 5 (ref 5–15)
BUN: 20 mg/dL (ref 6–20)
CALCIUM: 8.6 mg/dL — AB (ref 8.9–10.3)
CO2: 27 mmol/L (ref 22–32)
Chloride: 110 mmol/L (ref 101–111)
Creatinine, Ser: 1.35 mg/dL — ABNORMAL HIGH (ref 0.44–1.00)
GFR calc Af Amer: 45 mL/min — ABNORMAL LOW (ref >60)
GFR, EST NON AFRICAN AMERICAN: 38 mL/min — AB (ref >60)
GLUCOSE: 141 mg/dL — AB (ref 65–99)
PHOSPHORUS: 2.7 mg/dL (ref 2.5–4.6)
POTASSIUM: 3.7 mmol/L (ref 3.5–5.1)
SODIUM: 142 mmol/L (ref 135–145)

## 2016-12-06 LAB — POCT HEMOGLOBIN-HEMACUE: Hemoglobin: 10.3 g/dL — ABNORMAL LOW (ref 12.0–15.0)

## 2016-12-06 MED ORDER — EPOETIN ALFA 40000 UNIT/ML IJ SOLN
30000.0000 [IU] | INTRAMUSCULAR | Status: DC
Start: 2016-12-06 — End: 2016-12-07

## 2016-12-06 MED ORDER — EPOETIN ALFA 10000 UNIT/ML IJ SOLN
INTRAMUSCULAR | Status: AC
  Administered 2016-12-06: 10000 [IU] via SUBCUTANEOUS
  Filled 2016-12-06: qty 1

## 2016-12-06 MED ORDER — EPOETIN ALFA 20000 UNIT/ML IJ SOLN
INTRAMUSCULAR | Status: AC
  Administered 2016-12-06: 20000 [IU] via SUBCUTANEOUS
  Filled 2016-12-06: qty 1

## 2016-12-10 NOTE — Progress Notes (Deleted)
[facility] Note  12/11/2016     CHIEF COMPLAINT Patient presents for No chief complaint on file.   HISTORY OF PRESENT ILLNESS: Joann Gutierrez is a 71 y.o. female who presents to the clinic today for:     Referring physician: Seward Carol, MD [address], [place] [address]  HISTORICAL INFORMATION:   Selected notes from the MEDICAL RECORD NUMBER Referral from Dr. Read Drivers for r/o angiographic CME Ocular Hx - HZO OS; Pseudophakia OS (07/10/16); cellophane OS; HTN; PMH - DM II on insulin; Hx stroke;    CURRENT MEDICATIONS: Current Outpatient Medications (Ophthalmic Drugs)  Medication Sig   ketorolac (ACULAR) 0.5 % ophthalmic solution Place 1 drop into the left eye 4 (four) times daily.   nepafenac (ILEVRO) 0.3 % ophthalmic suspension Place 1 drop into the left eye daily.   polyvinyl alcohol (LIQUIFILM TEARS) 1.4 % ophthalmic solution Place 1 drop into both eyes as needed for dry eyes.   prednisoLONE acetate (PRED MILD) 0.12 % ophthalmic suspension Place 1 drop into the left eye 4 (four) times daily.   No current facility-administered medications for this visit.  (Ophthalmic Drugs)   Current Outpatient Medications (Other)  Medication Sig   aspirin EC 81 MG tablet Take 81 mg by mouth daily.   atorvastatin (LIPITOR) 40 MG tablet Take 40 mg by mouth daily.   bacitracin ointment Apply topically 2 (two) times daily. Apply to the rash until resolved   Calcium Carbonate-Vit D-Min (CALTRATE PLUS PO) Take 600 mg by mouth every morning.    donepezil (ARICEPT) 10 MG tablet Take 10 mg by mouth at bedtime.    doxazosin (CARDURA) 2 MG tablet Take 4 mg by mouth at bedtime.    Epoetin Alfa (PROCRIT IJ) Inject 1 each as directed once a week.   ezetimibe (ZETIA) 10 MG tablet Take 10 mg by mouth at bedtime.    fluticasone (FLONASE) 50 MCG/ACT nasal spray PLACE TWO SPRAYS INTO BOTH NOSTRILS DAILY   furosemide (LASIX) 80  MG tablet Take 1 tablet (80 mg total) by mouth daily.   gabapentin (NEURONTIN) 300 MG capsule Take 1 capsule (300 mg total) by mouth 2 (two) times daily.   guaiFENesin (MUCINEX) 600 MG 12 hr tablet Take 2 tablets (1,200 mg total) by mouth 2 (two) times daily as needed for cough or to loosen phlegm.   hydrALAZINE (APRESOLINE) 100 MG tablet Take 100 mg by mouth 3 (three) times daily.   isosorbide mononitrate (IMDUR) 120 MG 24 hr tablet Take 1 tablet (120 mg total) by mouth daily. PATIENT NEEDS TO CONTACT OFFICE FOR ADDITIONAL REFILLS   KLOR-CON M20 20 MEQ tablet Take 20 mEq by mouth daily.    LEVEMIR FLEXTOUCH 100 UNIT/ML Pen Inject 15 Units as directed every morning.   mometasone-formoterol (DULERA) 100-5 MCG/ACT AERO Inhale 2 puffs into the lungs 2 (two) times daily.   montelukast (SINGULAIR) 10 MG tablet TAKE ONE TABLET BY MOUTH EVERY DAY.   montelukast (SINGULAIR) 10 MG tablet TAKE ONE TABLET BY MOUTH EVERY DAY.   Multiple Vitamins-Minerals (MULTIVITAMIN GUMMIES ADULT) CHEW Chew 1 capsule by mouth daily.   nitroGLYCERIN (NITROSTAT) 0.4 MG SL tablet PLACE ONE TABLET UNDER THE TONGUE EVERY FIVE MINUTES AS NEEDED FOR CHEST PAIN   omeprazole (PRILOSEC) 20 MG capsule Take 1 capsule (20 mg total) by mouth 2 (two) times daily before a meal.   omeprazole (PRILOSEC) 40 MG capsule TAKE ONE CAPSULE BY MOUTH DAILY  ondansetron (ZOFRAN ODT) 4 MG disintegrating tablet Take 1 tablet (4 mg total) by mouth every 4 (four) hours as needed for nausea or vomiting.   OXYGEN Inhale into the lungs. CPAP with oxygen at bedtime   raloxifene (EVISTA) 60 MG tablet Take 60 mg by mouth every morning.    traMADol (ULTRAM) 50 MG tablet Take 1 tablet (50 mg total) by mouth every 8 (eight) hours as needed for moderate pain or severe pain. Reported on 02/07/2015   valACYclovir (VALTREX) 500 MG tablet Take 1 tablet (500 mg total) by mouth 3 (three) times daily.   VENTOLIN HFA 108 (90 Base) MCG/ACT inhaler INHALE  2 PUFFS INTO THE LUNGS EVERY SIX HOURS AS NEEDED FOR SHORTNESSOF BREATH   Vitamin D, Ergocalciferol, (DRISDOL) 50000 UNITS CAPS capsule Take 50,000 Units by mouth every Monday.    No current facility-administered medications for this visit.  (Other)      REVIEW OF SYSTEMS:    ALLERGIES Allergies  Allergen Reactions   Morphine And Related Other (See Comments)    Family request not to be given, reports pt does not wake up when given    Promethazine Hcl Other (See Comments)    REACTION: lethargy    PAST MEDICAL HISTORY Past Medical History:  Diagnosis Date   Adenomatous colon polyp    Allergy    Anemia    Asthma        CAD (coronary artery disease)    Mild very minimal coronary disease with 20% obtuse marginal stenosis   Carpal tunnel syndrome on left    CHF (congestive heart failure) (HCC)    Chronic kidney disease (CKD), stage III (moderate) (HCC)    COPD (chronic obstructive pulmonary disease) (HCC)    CVA (cerebral infarction)    Depression    Diabetes mellitus 1997   Type II    Diverticulosis    Elevated diaphragm November 2011   Right side   Esophageal dysmotility    Esophageal stricture    Gastritis    Gastroparesis 08/21/2007   GERD (gastroesophageal reflux disease)    Hair loss    Hearing loss of both ears    Hernia, hiatal    Hyperlipidemia    Hypertension    Morbid obesity ([place])    Obesity    Osteoarthritis    OSTEOARTHRITIS 08/09/2006   Osteoporosis    PERIPHERAL NEUROPATHY, FEET 09/23/2007   RENAL INSUFFICIENCY 02/16/2009   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 Read Drivers   COLONOSCOPY     COLONOSCOPY WITH PROPOFOL N/A 07/05/2015   Procedure: COLONOSCOPY WITH PROPOFOL;  Surgeon: Manus Gunning, MD;  Location: [facility];  Service: Gastroenterology;  Laterality: N/A;   ERD  08/08/2000   ESOPHAGEAL MANOMETRY N/A 03/13/2015  Procedure: ESOPHAGEAL MANOMETRY (EM);  Surgeon: Manus Gunning, MD;  Location: [facility];  Service: Gastroenterology;  Laterality: N/A;   ESOPHAGOGASTRODUODENOSCOPY  06/25/2006   ESOPHAGOGASTRODUODENOSCOPY (EGD) WITH PROPOFOL N/A 07/05/2015   Procedure: ESOPHAGOGASTRODUODENOSCOPY (EGD) WITH PROPOFOL;  Surgeon: Manus Gunning, MD;  Location: [facility];  Service: Gastroenterology;  Laterality: N/A;   HERNIA REPAIR     LEFT AND RIGHT HEART CATHETERIZATION WITH CORONARY ANGIOGRAM N/A 03/03/2013   Procedure: LEFT AND RIGHT HEART CATHETERIZATION WITH CORONARY ANGIOGRAM;  Surgeon: Minus Breeding, MD;  Location: [facility];  Service: Cardiovascular;  Laterality: N/A;   REPLACEMENT TOTAL KNEE Left 1998   UPPER GASTROINTESTINAL ENDOSCOPY      FAMILY HISTORY Family History  Problem Relation Age of Onset   Liver cancer Mother        Liver Cancer   Diabetes Mother    Kidney disease Mother    Heart disease Mother        age 59's   Heart disease Father 80       MI   Heart attack Father        died of MI when pt was 5   Sickle cell anemia Father    Colon cancer Brother    Cancer Brother        Colon Cancer   Diabetes Sister    Kidney disease Sister    Heart disease Sister        age 61's   Allergies Sister    Diabetes Sister    Kidney disease Sister    Heart disease Sister        age 68's   Esophageal cancer  Neg Hx    Rectal cancer Neg Hx    Stomach cancer Neg Hx    Amblyopia Neg Hx    Blindness Neg Hx    Glaucoma Neg Hx    Macular degeneration Neg Hx    Retinal detachment Neg Hx    Cataracts Neg Hx    Strabismus Neg Hx    Retinitis pigmentosa Neg Hx     SOCIAL HISTORY Social History   Tobacco Use   Smoking status: Former Smoker    Packs/day: 0.50    Years: 10.00    Pack years: 5.00    Last attempt to quit: 04/18/1980    Years since quitting: 36.6   Smokeless tobacco: Never Used  Substance Use Topics   Alcohol use: No    Alcohol/week: 0.0 oz   Drug use: No         OPHTHALMIC EXAM:   Not recorded      IMAGING AND PROCEDURES  Imaging and Procedures for 12/10/16           ASSESSMENT/PLAN:    ICD-10-CM   1. 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 Retinal edema H35.81   3. Mild nonproliferative diabetic retinopathy of both eyes without macular edema associated with type 2 diabetes mellitus ([place]) D97.4163   4. Pseudophakia, left eye Z96.1   5. Senile nuclear sclerosis, right H25.11     1,2. Angiographic CME / focal chorioretinal inflammation OS  - no CME or IRF on OCT, however FA shows some focal areas of late leakage from disc, macula consistent with chorioretinal inflammation  - pt's vision is relatively stable at 20/40, but is symptomatic w/ complaints of flashes/floaters and intermittent visual disturbances  - etiology likely multifactorial with history of herpes zoster, CE/PCIOL 4 mos ago, and DM2  -  currently on po valtrex (500 mg po TID), prednisolone acetate, and ketorolac QID OS  - repeat FA on 11/05/16 showed mild improvement in angiographic leakage  - discussed treatment options -- recommend continuation of present management as pt is having subjective improvement in symptoms as well as objective improvement on repeat FA  - consider subtenon's kenalog vs mild immunosuppression with  methotrexate if inflammation persists  3. mild Non-Proliferative diabetic retinopathy, both eyes  - The incidence, risk factors for progression, natural history and treatment options for diabetic retinopathy  were discussed with patient.  The need for close monitoring of blood glucose, blood pressure, and serum lipids, avoiding cigarette or any type of tobacco, and the need for long term follow up was also discussed with patient.  - as above, may be contributing to inflammatory picture OS  4. Pseudophakia OS  - IOL in good position -- beautiful surgery by Dr. Kathlen Mody  - no frank CME on OCT, but mildly present on FA -- management as above  - will continue to monitor  5. Senile nuclear sclerosis OD  - The symptoms of cataract, surgical options, and treatments and risks were discussed with patient.  - discussed diagnosis and progression  - under the expert care of Dr. Kathlen Mody   - management per Dr. Kathlen Mody    Ophthalmic Meds Ordered this visit:  No orders of the defined types were placed in this encounter.      No Follow-up on file.  There are no Patient Instructions on file for this visit.   Explained the diagnoses, plan, and follow up with the patient and they expressed understanding.  Patient expressed understanding of the importance of proper follow up care.   Gardiner Sleeper, M.D., Ph.D. Diseases & Surgery of the Retina and [place] 12/10/16      Abbreviations: M myopia (nearsighted); A astigmatism; H hyperopia (farsighted); P presbyopia; Mrx spectacle prescription;  CTL contact lenses; OD right eye; OS left eye; OU both eyes  XT exotropia; ET esotropia; PEK punctate epithelial keratitis; PEE punctate epithelial erosions; DES dry eye syndrome; MGD meibomian gland dysfunction; ATs artificial tears; PFAT's preservative free artificial tears; [place] nuclear sclerotic cataract; PSC posterior subcapsular cataract; ERM epi-retinal membrane; PVD  posterior vitreous detachment; RD retinal detachment; DM diabetes mellitus; DR diabetic retinopathy; NPDR non-proliferative diabetic retinopathy; PDR proliferative diabetic retinopathy; CSME clinically significant macular edema; DME diabetic macular edema; dbh dot blot hemorrhages; CWS cotton wool spot; POAG primary open angle glaucoma; C/D cup-to-disc ratio; HVF humphrey visual field; GVF goldmann visual field; OCT optical coherence tomography; IOP intraocular pressure; BRVO Branch retinal vein occlusion; CRVO central retinal vein occlusion; CRAO central retinal artery occlusion; BRAO branch retinal artery occlusion; RT retinal tear; SB scleral buckle; PPV pars plana vitrectomy; VH Vitreous hemorrhage; PRP panretinal laser photocoagulation; IVK intravitreal kenalog; VMT vitreomacular traction; MH Macular hole;  NVD neovascularization of the disc; NVE neovascularization elsewhere; AREDS age related eye disease study; ARMD age related macular degeneration; POAG primary open angle glaucoma; EBMD epithelial/anterior basement membrane dystrophy; ACIOL anterior chamber intraocular lens; IOL intraocular lens; PCIOL posterior chamber intraocular lens; Phaco/IOL phacoemulsification with intraocular lens placement; [place] photorefractive keratectomy; LASIK laser assisted in situ keratomileusis; HTN hypertension; DM diabetes mellitus; COPD chronic obstructive pulmonary disease

## 2016-12-11 ENCOUNTER — Encounter (INDEPENDENT_AMBULATORY_CARE_PROVIDER_SITE_OTHER): Payer: Medicare Other | Admitting: Ophthalmology

## 2016-12-17 NOTE — Progress Notes (Signed)
[facility] Note  12/18/2016     CHIEF COMPLAINT Patient presents for Diabetic Eye Exam and Retina Follow Up   HISTORY OF PRESENT ILLNESS: Joann Gutierrez is a 71 y.o. female who presents to the clinic today for:   HPI    Diabetic Eye Exam    Associated Symptoms Floaters, Pain and Trauma.  Negative for Distortion, Photophobia, Glare, Blind Spot, Shoulder/Hip pain, Fatigue, Jaw Claudication, Weight Loss, Scalp Tenderness, Redness, Flashes and Fever.  Diabetes characteristics include Type 2.  This started 9 years ago.  Blood sugar level fluctuates.  Last Blood Glucose 98.  Associated Diagnosis Neuropathy.          Retina Follow Up    In left eye.  This started 9 months ago.  Severity is mild.  Since onset it is stable.  I, the attending physician,  performed the HPI with the patient and updated documentation appropriately.          Comments    F/U CME/Focal chorioretinal inflammation. Patient states vision remains blurred and occasional floaters  OU. Pt reports having occasional pain OS. BS 98 this am A1C unsure .       Last edited by Bernarda Caffey, MD on 12/18/2016 10:38 AM. (History)      Referring physician: Seward Carol, MD [address], [place] [address]  HISTORICAL INFORMATION:   Selected notes from the MEDICAL RECORD NUMBER Referral from Dr. Read Drivers for r/o angiographic CME Ocular Hx - HZO OS; Pseudophakia OS (07/10/16); cellophane OS; HTN; PMH - DM II on insulin; Hx stroke;    CURRENT MEDICATIONS: Current Outpatient Medications (Ophthalmic Drugs)  Medication Sig  . ketorolac (ACULAR) 0.5 % ophthalmic solution Place 1 drop into the left eye 4 (four) times daily.  . nepafenac (ILEVRO) 0.3 % ophthalmic suspension Place 1 drop into the left eye daily.  . polyvinyl alcohol (LIQUIFILM TEARS) 1.4 % ophthalmic solution Place 1 drop into both eyes as needed for dry eyes.  . prednisoLONE acetate (PRED MILD) 0.12 %  ophthalmic suspension Place 1 drop into the left eye 4 (four) times daily.   Current Facility-Administered Medications (Ophthalmic Drugs)  Medication Route  . triamcinolone acetonide (TRIESENCE) 40 MG/ML subtenons injection 20 mg Subtenons   Current Outpatient Medications (Other)  Medication Sig  . aspirin EC 81 MG tablet Take 81 mg by mouth daily.  Marland Kitchen atorvastatin (LIPITOR) 40 MG tablet Take 40 mg by mouth daily.  . bacitracin ointment Apply topically 2 (two) times daily. Apply to the rash until resolved  . Calcium Carbonate-Vit D-Min (CALTRATE PLUS PO) Take 600 mg by mouth every morning.   . donepezil (ARICEPT) 10 MG tablet Take 10 mg by mouth at bedtime.   Marland Kitchen doxazosin (CARDURA) 2 MG tablet Take 4 mg by mouth at bedtime.   Marland Kitchen Epoetin Alfa (PROCRIT IJ) Inject 1 each as directed once a week.  . ezetimibe (ZETIA) 10 MG tablet Take 10 mg by mouth at bedtime.   . fluticasone (FLONASE) 50 MCG/ACT nasal spray PLACE TWO SPRAYS INTO BOTH NOSTRILS DAILY  . furosemide (LASIX) 80 MG tablet Take 1 tablet (80 mg total) by mouth daily.  Marland Kitchen gabapentin (NEURONTIN) 300 MG capsule Take 1 capsule (300 mg total) by mouth 2 (two) times daily.  Marland Kitchen guaiFENesin (MUCINEX) 600 MG 12 hr tablet Take 2 tablets (1,200 mg total) by mouth 2 (two) times daily as needed for cough or to loosen phlegm.  . hydrALAZINE (  APRESOLINE) 100 MG tablet Take 100 mg by mouth 3 (three) times daily.  . isosorbide mononitrate (IMDUR) 120 MG 24 hr tablet Take 1 tablet (120 mg total) by mouth daily. PATIENT NEEDS TO CONTACT OFFICE FOR ADDITIONAL REFILLS  . KLOR-CON M20 20 MEQ tablet Take 20 mEq by mouth daily.   Marland Kitchen LEVEMIR FLEXTOUCH 100 UNIT/ML Pen Inject 15 Units as directed every morning.  . mometasone-formoterol (DULERA) 100-5 MCG/ACT AERO Inhale 2 puffs into the lungs 2 (two) times daily.  . montelukast (SINGULAIR) 10 MG tablet TAKE ONE TABLET BY MOUTH EVERY DAY.  . montelukast (SINGULAIR) 10 MG tablet TAKE ONE TABLET BY MOUTH EVERY DAY.  .  Multiple Vitamins-Minerals (MULTIVITAMIN GUMMIES ADULT) CHEW Chew 1 capsule by mouth daily.  . nitroGLYCERIN (NITROSTAT) 0.4 MG SL tablet PLACE ONE TABLET UNDER THE TONGUE EVERY FIVE MINUTES AS NEEDED FOR CHEST PAIN  . omeprazole (PRILOSEC) 40 MG capsule TAKE ONE CAPSULE BY MOUTH DAILY  . ondansetron (ZOFRAN ODT) 4 MG disintegrating tablet Take 1 tablet (4 mg total) by mouth every 4 (four) hours as needed for nausea or vomiting.  . OXYGEN Inhale into the lungs. CPAP with oxygen at bedtime  . raloxifene (EVISTA) 60 MG tablet Take 60 mg by mouth every morning.   . traMADol (ULTRAM) 50 MG tablet Take 1 tablet (50 mg total) by mouth every 8 (eight) hours as needed for moderate pain or severe pain. Reported on 02/07/2015  . valACYclovir (VALTREX) 500 MG tablet Take 1 tablet (500 mg total) by mouth 3 (three) times daily.  . VENTOLIN HFA 108 (90 Base) MCG/ACT inhaler INHALE 2 PUFFS INTO THE LUNGS EVERY SIX HOURS AS NEEDED FOR SHORTNESSOF BREATH  . Vitamin D, Ergocalciferol, (DRISDOL) 50000 UNITS CAPS capsule Take 50,000 Units by mouth every Monday.   Marland Kitchen omeprazole (PRILOSEC) 20 MG capsule Take 1 capsule (20 mg total) by mouth 2 (two) times daily before a meal.   No current facility-administered medications for this visit.  (Other)      REVIEW OF SYSTEMS: ROS    Positive for: Neurological, Genitourinary, Musculoskeletal, HENT, Endocrine, Cardiovascular, Eyes, Respiratory, Psychiatric   Negative for: Constitutional, Gastrointestinal, Skin, Allergic/Imm, Heme/Lymph   Last edited by Zenovia Jordan, LPN on 32/67/1245 80:99 AM. (History)       ALLERGIES Allergies  Allergen Reactions  . Morphine And Related Other (See Comments)    Family request not to be given, reports pt does not wake up when given   . Promethazine Hcl Other (See Comments)    REACTION: lethargy    PAST MEDICAL HISTORY Past Medical History:  Diagnosis Date  . Adenomatous colon polyp   . Allergy   . Anemia   . Asthma        . CAD (coronary artery disease)    Mild very minimal coronary disease with 20% obtuse marginal stenosis  . Carpal tunnel syndrome on left   . CHF (congestive heart failure) ([place])   . Chronic kidney disease (CKD), stage III (moderate) (HCC)   . COPD (chronic obstructive pulmonary disease) ([place])   . CVA (cerebral infarction)   . Depression   . 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 Diverticulosis   . 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 Esophageal dysmotility   . Esophageal stricture   . Gastritis   . Gastroparesis 08/21/2007  . GERD (gastroesophageal reflux disease)   . Hair loss   . Hearing loss of both ears   . Hernia, hiatal   .  Hyperlipidemia   . Hypertension   . Morbid obesity ([place])   . Obesity   . Osteoarthritis   . OSTEOARTHRITIS 08/09/2006  . Osteoporosis   . 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 RENAL INSUFFICIENCY 02/16/2009  . 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 Seizures ([place])    pt thinks it has been several monthes since she had a seisure  . Shingles   . Sickle cell trait ([place])   . Stroke ([employer])   . Tubular adenoma of colon    Past Surgical History:  Procedure Laterality Date  . ABDOMINAL HYSTERECTOMY    . ARTERY BIOPSY  01/07/2011   Procedure: MINOR BIOPSY TEMPORAL ARTERY;  Surgeon: Haywood Lasso, MD;  Location: [facility];  Service: General;  Laterality: Left;  left temporal artery biopsy  . bil foot surgery    . BREAST LUMPECTOMY     benign  . BREAST LUMPECTOMY     both breast lumps removed   . CATARACT EXTRACTION Left 06/2016   Dr. Read Drivers  . COLONOSCOPY    . COLONOSCOPY WITH PROPOFOL N/A 07/05/2015   Procedure: COLONOSCOPY WITH PROPOFOL;  Surgeon: Manus Gunning, MD;  Location: [facility];  Service: Gastroenterology;  Laterality: N/A;  . ERD  08/08/2000  . ESOPHAGEAL MANOMETRY N/A 03/13/2015   Procedure: ESOPHAGEAL MANOMETRY (EM);  Surgeon: Manus Gunning, MD;  Location: [facility];   Service: Gastroenterology;  Laterality: N/A;  . ESOPHAGOGASTRODUODENOSCOPY  06/25/2006  . ESOPHAGOGASTRODUODENOSCOPY (EGD) WITH PROPOFOL N/A 07/05/2015   Procedure: ESOPHAGOGASTRODUODENOSCOPY (EGD) WITH PROPOFOL;  Surgeon: Manus Gunning, MD;  Location: [facility];  Service: Gastroenterology;  Laterality: N/A;  . HERNIA REPAIR    . LEFT AND RIGHT HEART CATHETERIZATION WITH CORONARY ANGIOGRAM N/A 03/03/2013   Procedure: LEFT AND RIGHT HEART CATHETERIZATION WITH CORONARY ANGIOGRAM;  Surgeon: Minus Breeding, MD;  [facility];  Service: Cardiovascular;  Laterality: N/A;  . REPLACEMENT TOTAL KNEE Left 1998  . UPPER GASTROINTESTINAL ENDOSCOPY      FAMILY HISTORY Family History  Problem Relation Age of Onset  . Liver cancer Mother        Liver Cancer  . Diabetes Mother   . Kidney disease Mother   . Heart disease Mother        age 3's  . Heart disease Father 3       MI  . Heart attack Father        died of MI when pt was 63  . Sickle cell anemia Father   . Colon cancer Brother   . Cancer Brother        Colon Cancer  . Diabetes Sister   . Kidney disease Sister   . Heart disease Sister        age 64's  . Allergies Sister   . Diabetes Sister   . Kidney disease Sister   . Heart disease Sister        age 81's  . Esophageal cancer Neg Hx   . Rectal cancer Neg Hx   . Stomach cancer Neg Hx   . Amblyopia Neg Hx   . Blindness Neg Hx   . Glaucoma Neg Hx   . Macular degeneration Neg Hx   . Retinal detachment Neg Hx   . Cataracts Neg Hx   . Strabismus Neg Hx   . Retinitis pigmentosa Neg Hx     SOCIAL HISTORY Social History   Tobacco Use  . Smoking status: Former Smoker    Packs/day: 0.50  Years: 10.00    Pack years: 5.00    Last attempt to quit: 04/18/1980    Years since quitting: 36.6  . Smokeless tobacco: Never Used  Substance Use Topics  . Alcohol use: No    Alcohol/week: 0.0 oz  . Drug use: No         OPHTHALMIC EXAM:  Base Eye Exam     Visual Acuity (Snellen - Linear)      Right Left   Dist cc 20/40 20/40   Dist ph cc NI NI   Correction:  Glasses  Work up done by [employer] ([place], 10:49 AM)      Right Left   Pressure 18 19       Pupils      Dark Shape APD   Right 3 Round None   Left 3 Round None       Visual Fields (Counting fingers)      Left Right    Full Full       Extraocular Movement      Right Left    Full, Ortho Full, Ortho       Neuro/Psych    Oriented x3:  Yes   Mood/Affect:  Normal       Dilation    Both eyes:  1.0% Mydriacyl, 2.5% Phenylephrine @ 10:49 AM        Slit Lamp and Fundus Exam    External Exam      Right Left   External Normal Normal       Slit Lamp Exam      Right Left   Lids/Lashes Dermatochalasis - upper lid Dermatochalasis - upper lid   Conjunctiva/Sclera melanosis  White and quiet   Cornea Clear well healed temporal insision   Anterior Chamber Deep and quiet Deep and 1/2+ pigment   Iris Round and dilated; no NVI dilates to 4.25 mm; no NVI   Lens 2-3+ Nuclear sclerosis with mild brunescence, 2+ Cortical cataract Posterior chamber intraocular lens in good position    Vitreous Vitreous syneresis Vitreous syneresis - mild; 1+ anterior pigment       Fundus Exam      Right Left   Disc Normal Normal   C/D Ratio 0.45 0.5   Macula Flat; mild RPE change, trace MA Trace ERM; trace MA; mild RPE changes;    Vessels Vascular attenuation - mild; AV crossing changes AV crossing changes; mild venous dilation, Copper wiring   Periphery Attached attached; superior nasal blot hemes          IMAGING AND PROCEDURES  Imaging and Procedures for 12/18/16  OCT, Retina - OU - Both Eyes     Right Eye Quality was good. Central Foveal Thickness: 234. Progression has been stable. Findings include normal foveal contour, no IRF, no SRF.   Left Eye Quality was good. Central Foveal Thickness: 270. Progression has been stable. Findings include normal foveal contour,  no SRF, no IRF.   Notes Images taken, stored on drive  Diagnosis / Impression:  No macular edema, IRF, SRF OU Tr cystic changes OS  Clinical management:  See below  Abbreviations: NFP - Normal foveal profile. CME - cystoid macular edema.  PED - pigment epithelial detachment. IRF - intraretinal fluid. SRF -  subretinal fluid. EZ - ellipsoid zone. ERM - epiretinal membrane. ORA -  outer retinal atrophy. ORT - outer retinal tubulation. SRHM - subretinal  hyper-reflective material        Fluorescein  Angiography [place] (Transit OS)     Right Eye Progression has been stable. Early phase findings include microaneurysm. Mid/Late phase findings include microaneurysm, leakage.   Left Eye Progression has been stable. Early phase findings include microaneurysm, leakage. Mid/Late phase findings include leakage, microaneurysm.   Notes OD: scattered, punctate areas of hyperfluorescence consistent with MAs OS: focal areas of hyperfluorescent leakage around disc and scattered throughout macula; scattered Mas       Injection into Tenon's Capsule - OS - Left Eye     Time Out 12/18/2016. 12:06 PM. Confirmed correct patient, procedure, site, and patient consented.   Anesthesia Subconjunctival anesthesia was used. Anesthetic medications included Lidocaine 2%.   Procedure Preparation included eyelid speculum. A 27 gauge needle was used.   Injection: 20 mg triamcinolone acetonide 40 MG/ML   NDC: 7829-5621-30    Lot: [id number]    Expiration Date: 10/20/2017   Route: Subtenons   Site: Left Eye   Waste: 20 mg  Post-op The patient tolerated the procedure well. There were no complications. The patient received written and verbal post procedure care education. Post injection medications included ocuflox.                 ASSESSMENT/PLAN:    ICD-10-CM   1. 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 Retinal edema H35.81 OCT, Retina - OU - Both Eyes  4. Mild nonproliferative diabetic retinopathy of both eyes without macular edema associated with type 2 diabetes mellitus ([place]) N62.9528 Fluorescein Angiography [place] (Transit OS)  5. Pseudophakia, left eye Z96.1   6. Senile nuclear sclerosis, right H25.11     1,2,3. Angiographic CME / focal chorioretinal inflammation / Posterior uveitis OS  - tr CME or IRF on OCT, however FA shows some focal areas of late leakage from disc, macula consistent with chorioretinal inflammation  - pt's vision is relatively stable at 20/40, but is symptomatic w/ complaints of flashes/floaters and intermittent visual disturbances  - etiology likely multifactorial with history of herpes zoster, CE/PCIOL 4 mos ago, and DM2  - currently on po valtrex (500 mg po TID), prednisolone acetate, and ketorolac QID OS  - repeat FA today, 11.28.18, shows persistent angiographic leakage  - discussed treatment options -- subtenon's kenalog vs mild immunosuppression with methotrexate  - pt wishes to proceed with subtenon's kenalog today (11.28.18)  - RBA of procedure discussed, questions answered  - informed consent obtained and signed  - see procedure note  - F/U 4 wks  4. mild Non-Proliferative diabetic retinopathy, both eyes  - The incidence, risk factors for progression, natural history and treatment options for diabetic retinopathy  were discussed with patient.  The need for close monitoring of blood glucose, blood pressure, and serum lipids, avoiding cigarette or any type of tobacco, and the  need for long term follow up was also discussed with patient.  - as above, may be contributing to inflammatory picture OS  5. Pseudophakia OS  - IOL in good position -- beautiful surgery by Dr.  Weaver  - no frank CME on OCT, but mildly present on FA -- management as above  - will continue to monitor  6. Senile nuclear sclerosis OD  - The symptoms of cataract, surgical options, and treatments and risks were discussed with patient.  - discussed diagnosis and progression  - under the expert care of Dr. Kathlen Mody   - management per Dr. Kathlen Mody    Ophthalmic Meds Ordered this visit:  Meds ordered this encounter  Medications  . triamcinolone acetonide (TRIESENCE) 40 MG/ML subtenons injection 20 mg       Return in about 4 weeks (around 01/15/2017) for F/U CME/focal chorioretinal inflammation OS .  There are no Patient Instructions on file for this visit.   Explained the diagnoses, plan, and follow up with the patient and they expressed understanding.  Patient expressed understanding of the importance of proper follow up care.   Gardiner Sleeper, M.D., Ph.D. Diseases & Surgery of the Retina and Vitreous [facility] 12/18/16      Abbreviations: M myopia (nearsighted); A astigmatism; H hyperopia (farsighted); P presbyopia; Mrx spectacle prescription;  CTL contact lenses; OD right eye; OS left eye; OU both eyes  XT exotropia; ET esotropia; PEK punctate epithelial keratitis; PEE punctate epithelial erosions; DES dry eye syndrome; MGD meibomian gland dysfunction; ATs artificial tears; PFAT's preservative free artificial tears; [place] nuclear sclerotic cataract; PSC posterior subcapsular cataract; ERM epi-retinal membrane; PVD posterior vitreous detachment; RD retinal detachment; DM diabetes mellitus; DR diabetic retinopathy; NPDR non-proliferative diabetic retinopathy; PDR proliferative diabetic retinopathy; CSME clinically significant macular edema; DME diabetic macular  edema; dbh dot blot hemorrhages; CWS cotton wool spot; POAG primary open angle glaucoma; C/D cup-to-disc ratio; HVF humphrey visual field; GVF goldmann visual field; OCT optical coherence tomography; IOP intraocular pressure; BRVO Branch retinal vein occlusion; CRVO central retinal vein occlusion; CRAO central retinal artery occlusion; BRAO branch retinal artery occlusion; RT retinal tear; SB scleral buckle; PPV pars plana vitrectomy; VH Vitreous hemorrhage; PRP panretinal laser photocoagulation; IVK intravitreal kenalog; VMT vitreomacular traction; MH Macular hole;  NVD neovascularization of the disc; NVE neovascularization elsewhere; AREDS age related eye disease study; ARMD age related macular degeneration; POAG primary open angle glaucoma; EBMD epithelial/anterior basement membrane dystrophy; ACIOL anterior chamber intraocular lens; IOL intraocular lens; PCIOL posterior chamber intraocular lens; Phaco/IOL phacoemulsification with intraocular lens placement; [place] photorefractive keratectomy; LASIK laser assisted in situ keratomileusis; HTN hypertension; DM diabetes mellitus; COPD chronic obstructive pulmonary disease

## 2016-12-18 ENCOUNTER — Ambulatory Visit (INDEPENDENT_AMBULATORY_CARE_PROVIDER_SITE_OTHER): Payer: Medicare Other | Admitting: Ophthalmology

## 2016-12-18 ENCOUNTER — Encounter (INDEPENDENT_AMBULATORY_CARE_PROVIDER_SITE_OTHER): Payer: Self-pay | Admitting: Ophthalmology

## 2016-12-18 DIAGNOSIS — H30002 Unspecified focal chorioretinal inflammation, left eye: Secondary | ICD-10-CM | POA: Diagnosis not present

## 2016-12-18 DIAGNOSIS — Z961 Presence of intraocular lens: Secondary | ICD-10-CM | POA: Diagnosis not present

## 2016-12-18 DIAGNOSIS — H2511 Age-related nuclear cataract, right eye: Secondary | ICD-10-CM | POA: Diagnosis not present

## 2016-12-18 DIAGNOSIS — H3581 Retinal edema: Secondary | ICD-10-CM | POA: Diagnosis not present

## 2016-12-18 DIAGNOSIS — H3092 Unspecified chorioretinal inflammation, left eye: Secondary | ICD-10-CM

## 2016-12-18 DIAGNOSIS — E113293 Type 2 diabetes mellitus with mild nonproliferative diabetic retinopathy without macular edema, bilateral: Secondary | ICD-10-CM | POA: Diagnosis not present

## 2016-12-18 MED ORDER — TRIAMCINOLONE ACETONIDE 40 MG/ML IO SUSP
20.0000 mg | INTRAOCULAR | Status: DC
  Administered 2016-12-18: 20 mg

## 2016-12-20 ENCOUNTER — Encounter (HOSPITAL_COMMUNITY)
Admission: RE | Admit: 2016-12-20 | Discharge: 2016-12-20 | Disposition: A | Discharge status: Home / Self Care | Payer: Medicare Other | Source: Ambulatory Visit | Attending: Nephrology | Admitting: Nephrology

## 2016-12-20 VITALS — BP 196/97 | HR 66 | Temp 98.1°F | Resp 20

## 2016-12-20 DIAGNOSIS — N183 Chronic kidney disease, stage 3 unspecified: Secondary | ICD-10-CM

## 2016-12-20 DIAGNOSIS — D631 Anemia in chronic kidney disease: Secondary | ICD-10-CM | POA: Diagnosis not present

## 2016-12-20 LAB — FERRITIN: FERRITIN: 530 ng/mL — AB (ref 11–307)

## 2016-12-20 LAB — IRON AND TIBC
Iron: 117 ug/dL (ref 28–170)
SATURATION RATIOS: 50 % — AB (ref 10.4–31.8)
TIBC: 232 ug/dL — AB (ref 250–450)
UIBC: 115 ug/dL

## 2016-12-20 LAB — POCT HEMOGLOBIN-HEMACUE: HEMOGLOBIN: 11.1 g/dL — AB (ref 12.0–15.0)

## 2016-12-20 MED ORDER — EPOETIN ALFA 10000 UNIT/ML IJ SOLN
INTRAMUSCULAR | Status: AC
  Filled 2016-12-20: qty 1

## 2016-12-20 MED ORDER — EPOETIN ALFA 40000 UNIT/ML IJ SOLN: 30000.0000 [IU] | INTRAMUSCULAR | Status: DC

## 2016-12-20 MED ORDER — CLONIDINE HCL 0.1 MG PO TABS
0.1000 mg | ORAL_TABLET | Freq: Once | ORAL | Status: AC
  Administered 2016-12-20: 0.1 mg via ORAL

## 2016-12-20 MED ORDER — EPOETIN ALFA 20000 UNIT/ML IJ SOLN
INTRAMUSCULAR | Status: AC
  Filled 2016-12-20: qty 1

## 2016-12-20 NOTE — Progress Notes (Signed)
Dr Posey Pronto notified of increased blood pressures continuing after Clonidine.  Instructed to hold Procrit injection today and for patient to return in one week.  Pt advised and instructed to continue to take home meds as prescribed

## 2016-12-21 LAB — PTH, INTACT AND CALCIUM
Calcium, Total (PTH): 8.6 mg/dL — ABNORMAL LOW (ref 8.7–10.3)
PTH: 146 pg/mL — ABNORMAL HIGH (ref 15–65)

## 2016-12-27 ENCOUNTER — Encounter (HOSPITAL_COMMUNITY)
Admission: RE | Admit: 2016-12-27 | Discharge: 2016-12-27 | Disposition: A | Discharge status: Home / Self Care | Payer: Medicare Other | Source: Ambulatory Visit | Attending: Nephrology | Admitting: Nephrology

## 2016-12-27 ENCOUNTER — Other Ambulatory Visit (INDEPENDENT_AMBULATORY_CARE_PROVIDER_SITE_OTHER): Payer: Self-pay | Admitting: Ophthalmology

## 2016-12-27 VITALS — BP 171/107 | HR 78 | Temp 98.6°F

## 2016-12-27 DIAGNOSIS — D631 Anemia in chronic kidney disease: Secondary | ICD-10-CM | POA: Diagnosis not present

## 2016-12-27 DIAGNOSIS — N183 Chronic kidney disease, stage 3 unspecified: Secondary | ICD-10-CM

## 2016-12-27 MED ORDER — EPOETIN ALFA 20000 UNIT/ML IJ SOLN
INTRAMUSCULAR | Status: AC
  Administered 2016-12-27: 20000 [IU]
  Filled 2016-12-27: qty 1

## 2016-12-27 MED ORDER — EPOETIN ALFA 10000 UNIT/ML IJ SOLN
INTRAMUSCULAR | Status: AC
  Administered 2016-12-27: 10000 [IU]
  Filled 2016-12-27: qty 1

## 2016-12-27 MED ORDER — EPOETIN ALFA 40000 UNIT/ML IJ SOLN: 30000.0000 [IU] | INTRAMUSCULAR | Status: DC

## 2016-12-27 MED ORDER — CLONIDINE HCL 0.1 MG PO TABS
ORAL_TABLET | ORAL | Status: AC
  Filled 2016-12-27: qty 1

## 2016-12-27 MED ORDER — CLONIDINE HCL 0.1 MG PO TABS
ORAL_TABLET | ORAL | Status: AC
  Administered 2016-12-27: 0.1 mg
  Filled 2016-12-27: qty 1

## 2016-12-27 NOTE — Progress Notes (Signed)
Pt here for Procrit injection.  B/P 189/78 initailly.  Rechecked in 20 minutes and B/P was 191/93.  Pt received Clonidine 0.1mg  po.  Will recheck in 30 minutes

## 2016-12-31 LAB — POCT HEMOGLOBIN-HEMACUE: HEMOGLOBIN: 10.4 g/dL — AB (ref 12.0–15.0)

## 2017-01-03 ENCOUNTER — Encounter (HOSPITAL_COMMUNITY): Payer: Medicare Other

## 2017-01-09 ENCOUNTER — Inpatient Hospital Stay (HOSPITAL_COMMUNITY)
Admission: RE | Admit: 2017-01-09 | Discharge: 2017-01-09 | Disposition: A | Discharge status: Home / Self Care | Payer: Medicare Other | Source: Ambulatory Visit | Attending: Nephrology | Admitting: Nephrology

## 2017-01-09 NOTE — Progress Notes (Deleted)
[facility] Note  01/22/2017     CHIEF COMPLAINT Patient presents for No chief complaint on file.   HISTORY OF PRESENT ILLNESS: Joann Gutierrez is a 71 y.o. female who presents to the clinic today for:     Referring physician: Seward Carol, MD [address], [place] [address]  HISTORICAL INFORMATION:   Selected notes from the MEDICAL RECORD NUMBER Referral from Dr. Read Drivers for r/o angiographic CME Ocular Hx - HZO OS; Pseudophakia OS (07/10/16); cellophane OS; HTN; PMH - DM II on insulin; Hx stroke;    CURRENT MEDICATIONS: Current Outpatient Medications (Ophthalmic Drugs)  Medication Sig   ketorolac (ACULAR) 0.5 % ophthalmic solution Place 1 drop into the left eye 4 (four) times daily.   nepafenac (ILEVRO) 0.3 % ophthalmic suspension Place 1 drop into the left eye daily.   polyvinyl alcohol (LIQUIFILM TEARS) 1.4 % ophthalmic solution Place 1 drop into both eyes as needed for dry eyes.   prednisoLONE acetate (PRED MILD) 0.12 % ophthalmic suspension Place 1 drop into the left eye 4 (four) times daily.   Current Facility-Administered Medications (Ophthalmic Drugs)  Medication Route   triamcinolone acetonide (TRIESENCE) 40 MG/ML subtenons injection 20 mg Subtenons   Current Outpatient Medications (Other)  Medication Sig   aspirin EC 81 MG tablet Take 81 mg by mouth daily.   atorvastatin (LIPITOR) 40 MG tablet Take 40 mg by mouth daily.   bacitracin ointment Apply topically 2 (two) times daily. Apply to the rash until resolved   Calcium Carbonate-Vit D-Min (CALTRATE PLUS PO) Take 600 mg by mouth every morning.    donepezil (ARICEPT) 10 MG tablet Take 10 mg by mouth at bedtime.    doxazosin (CARDURA) 2 MG tablet Take 4 mg by mouth at bedtime.    Epoetin Alfa (PROCRIT IJ) Inject 1 each as directed once a week.   ezetimibe (ZETIA) 10 MG tablet Take 10 mg by mouth at bedtime.    fluticasone (FLONASE) 50 MCG/ACT  nasal spray PLACE TWO SPRAYS INTO BOTH NOSTRILS DAILY   furosemide (LASIX) 80 MG tablet Take 1 tablet (80 mg total) by mouth daily.   gabapentin (NEURONTIN) 300 MG capsule Take 1 capsule (300 mg total) by mouth 2 (two) times daily.   guaiFENesin (MUCINEX) 600 MG 12 hr tablet Take 2 tablets (1,200 mg total) by mouth 2 (two) times daily as needed for cough or to loosen phlegm.   hydrALAZINE (APRESOLINE) 100 MG tablet Take 100 mg by mouth 3 (three) times daily.   isosorbide mononitrate (IMDUR) 120 MG 24 hr tablet Take 1 tablet (120 mg total) by mouth daily. PATIENT NEEDS TO CONTACT OFFICE FOR ADDITIONAL REFILLS   KLOR-CON M20 20 MEQ tablet Take 20 mEq by mouth daily.    LEVEMIR FLEXTOUCH 100 UNIT/ML Pen Inject 15 Units as directed every morning.   mometasone-formoterol (DULERA) 100-5 MCG/ACT AERO Inhale 2 puffs into the lungs 2 (two) times daily.   montelukast (SINGULAIR) 10 MG tablet TAKE ONE TABLET BY MOUTH EVERY DAY.   montelukast (SINGULAIR) 10 MG tablet TAKE ONE TABLET BY MOUTH EVERY DAY.   Multiple Vitamins-Minerals (MULTIVITAMIN GUMMIES ADULT) CHEW Chew 1 capsule by mouth daily.   nitroGLYCERIN (NITROSTAT) 0.4 MG SL tablet PLACE ONE TABLET UNDER THE TONGUE EVERY FIVE MINUTES AS NEEDED FOR CHEST PAIN   omeprazole (PRILOSEC) 20 MG capsule Take 1 capsule (20 mg total) by mouth 2 (two) times daily before a meal.   omeprazole (PRILOSEC)  40 MG capsule TAKE ONE CAPSULE BY MOUTH DAILY   ondansetron (ZOFRAN ODT) 4 MG disintegrating tablet Take 1 tablet (4 mg total) by mouth every 4 (four) hours as needed for nausea or vomiting.   OXYGEN Inhale into the lungs. CPAP with oxygen at bedtime   raloxifene (EVISTA) 60 MG tablet Take 60 mg by mouth every morning.    traMADol (ULTRAM) 50 MG tablet Take 1 tablet (50 mg total) by mouth every 8 (eight) hours as needed for moderate pain or severe pain. Reported on 02/07/2015   valACYclovir (VALTREX) 500 MG tablet Take 1 tablet (500 mg total) by  mouth 3 (three) times daily.   VENTOLIN HFA 108 (90 Base) MCG/ACT inhaler INHALE 2 PUFFS INTO THE LUNGS EVERY SIX HOURS AS NEEDED FOR SHORTNESSOF BREATH   Vitamin D, Ergocalciferol, (DRISDOL) 50000 UNITS CAPS capsule Take 50,000 Units by mouth every Monday.    No current facility-administered medications for this visit.  (Other)      REVIEW OF SYSTEMS:    ALLERGIES Allergies  Allergen Reactions   Morphine And Related Other (See Comments)    Family request not to be given, reports pt does not wake up when given    Promethazine Hcl Other (See Comments)    REACTION: lethargy    PAST MEDICAL HISTORY Past Medical History:  Diagnosis Date   Adenomatous colon polyp    Allergy    Anemia    Asthma        CAD (coronary artery disease)    Mild very minimal coronary disease with 20% obtuse marginal stenosis   Carpal tunnel syndrome on left    CHF (congestive heart failure) (HCC)    Chronic kidney disease (CKD), stage III (moderate) (HCC)    COPD (chronic obstructive pulmonary disease) (HCC)    CVA (cerebral infarction)    Depression    Diabetes mellitus 1997   Type II    Diverticulosis    Elevated diaphragm November 2011   Right side   Esophageal dysmotility    Esophageal stricture    Gastritis    Gastroparesis 08/21/2007   GERD (gastroesophageal reflux disease)    Hair loss    Hearing loss of both ears    Hernia, hiatal    Hyperlipidemia    Hypertension    Morbid obesity ([place])    Obesity    Osteoarthritis    OSTEOARTHRITIS 08/09/2006   Osteoporosis    PERIPHERAL NEUROPATHY, FEET 09/23/2007   RENAL INSUFFICIENCY 02/16/2009   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   Seizures ([place])    pt thinks it has been several monthes since she had a seisure   Shingles    Sickle cell trait ([place])    Stroke ([place])    Tubular adenoma of colon    Past Surgical History:  Procedure Laterality Date   ABDOMINAL HYSTERECTOMY     ARTERY  BIOPSY  01/07/2011   Procedure: MINOR BIOPSY TEMPORAL ARTERY;  Surgeon: Haywood Lasso, MD;  Location: [facility];  Service: General;  Laterality: Left;  left temporal artery biopsy   bil foot surgery     BREAST LUMPECTOMY     benign   BREAST LUMPECTOMY     both breast lumps removed    CATARACT EXTRACTION Left 06/2016   Dr. Read Drivers   COLONOSCOPY     COLONOSCOPY WITH PROPOFOL N/A 07/05/2015   Procedure: COLONOSCOPY WITH PROPOFOL;  Surgeon: Manus Gunning, MD;  Location: [facility];  Service: Gastroenterology;  Laterality: N/A;  ERD  08/08/2000   ESOPHAGEAL MANOMETRY N/A 03/13/2015   Procedure: ESOPHAGEAL MANOMETRY (EM);  Surgeon: Manus Gunning, MD;  Location: [facility];  Service: Gastroenterology;  Laterality: N/A;   ESOPHAGOGASTRODUODENOSCOPY  06/25/2006   ESOPHAGOGASTRODUODENOSCOPY (EGD) WITH PROPOFOL N/A 07/05/2015   Procedure: ESOPHAGOGASTRODUODENOSCOPY (EGD) WITH PROPOFOL;  Surgeon: Manus Gunning, MD;  Location: [facility];  Service: Gastroenterology;  Laterality: N/A;   HERNIA REPAIR     LEFT AND RIGHT HEART CATHETERIZATION WITH CORONARY ANGIOGRAM N/A 03/03/2013   Procedure: LEFT AND RIGHT HEART CATHETERIZATION WITH CORONARY ANGIOGRAM;  Surgeon: Minus Breeding, MD;  Location: [facility];  Service: Cardiovascular;  Laterality: N/A;   REPLACEMENT TOTAL KNEE Left 1998   UPPER GASTROINTESTINAL ENDOSCOPY      FAMILY HISTORY Family History  Problem Relation Age of Onset   Liver cancer Mother        Liver Cancer   Diabetes Mother    Kidney disease Mother    Heart disease Mother        age 82's   Heart disease Father 5       MI   Heart attack Father        died of MI when pt was 5   Sickle cell anemia Father    Colon cancer Brother    Cancer Brother        Colon Cancer   Diabetes Sister    Kidney disease Sister    Heart disease Sister        age 28's   Allergies Sister    Diabetes Sister     Kidney disease Sister    Heart disease Sister        age 60's   Esophageal cancer Neg Hx    Rectal cancer Neg Hx    Stomach cancer Neg Hx    Amblyopia Neg Hx    Blindness Neg Hx    Glaucoma Neg Hx    Macular degeneration Neg Hx    Retinal detachment Neg Hx    Cataracts Neg Hx    Strabismus Neg Hx    Retinitis pigmentosa Neg Hx     SOCIAL HISTORY Social History   Tobacco Use   Smoking status: Former Smoker    Packs/day: 0.50    Years: 10.00    Pack years: 5.00    Last attempt to quit: 04/18/1980    Years since quitting: 36.7   Smokeless tobacco: Never Used  Substance Use Topics   Alcohol use: No    Alcohol/week: 0.0 oz   Drug use: No         OPHTHALMIC EXAM:   Not recorded      IMAGING AND PROCEDURES  Imaging and Procedures for 01/09/17           ASSESSMENT/PLAN:    ICD-10-CM   1. 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 Retinal edema H35.81   4. Mild nonproliferative diabetic retinopathy of both eyes without macular edema associated with type 2 diabetes mellitus ([place]) Q67.6195   5. Pseudophakia, left eye Z96.1   6. Senile nuclear sclerosis, right H25.11     1,2,3. Angiographic CME / focal chorioretinal inflammation / Posterior uveitis OS  - tr CME or IRF on OCT, however FA shows some focal areas of late leakage from disc, macula consistent with chorioretinal inflammation  - pt's vision is relatively stable at 20/40, but is symptomatic w/ complaints of  flashes/floaters and intermittent visual disturbances  - etiology likely multifactorial with history of herpes zoster, CE/PCIOL 4 mos ago, and DM2  - currently on po valtrex (500 mg po TID), prednisolone acetate, and ketorolac QID OS  - repeat FA today, 11.28.18, shows persistent angiographic leakage  - discussed treatment options -- subtenon's kenalog vs mild immunosuppression with methotrexate  - S/P  Subtenon's Kenalog OS (11.28.18)  - F/U 4 wks  4. mild Non-Proliferative diabetic retinopathy, both eyes  - The incidence, risk factors for progression, natural history and treatment options for diabetic retinopathy  were discussed with patient.  The need for close monitoring of blood glucose, blood pressure, and serum lipids, avoiding cigarette or any type of tobacco, and the need for long term follow up was also discussed with patient.  - as above, may be contributing to inflammatory picture OS  5. Pseudophakia OS  - IOL in good position -- beautiful surgery by Dr. Kathlen Mody  - no frank CME on OCT, but mildly present on FA -- management as above  - will continue to monitor  6. Senile nuclear sclerosis OD  - The symptoms of cataract, surgical options, and treatments and risks were discussed with patient.  - discussed diagnosis and progression  - under the expert care of Dr. Kathlen Mody   - management per Dr. Kathlen Mody    Ophthalmic Meds Ordered this visit:  No orders of the defined types were placed in this encounter.      No Follow-up on file.  There are no Patient Instructions on file for this visit.   Explained the diagnoses, plan, and follow up with the patient and they expressed understanding.  Patient expressed understanding of the importance of proper follow up care.   This document serves as a record of services personally performed by Gardiner Sleeper, MD, PhD. It was created on their behalf by Catha Brow, [place], a certified ophthalmic assistant. The creation of this record is the provider's dictation and/or activities during the visit.  Electronically signed by: Catha Brow, [place]  01/09/17 2:18 PM    Gardiner Sleeper, M.D., Ph.D. Diseases & Surgery of the Retina and [employer] 01/09/17      Abbreviations: M myopia (nearsighted); A astigmatism; H hyperopia (farsighted); P presbyopia; Mrx spectacle prescription;  CTL contact lenses;  OD right eye; OS left eye; OU both eyes  XT exotropia; ET esotropia; PEK punctate epithelial keratitis; PEE punctate epithelial erosions; DES dry eye syndrome; MGD meibomian gland dysfunction; ATs artificial tears; PFAT's preservative free artificial tears; [place] nuclear sclerotic cataract; PSC posterior subcapsular cataract; ERM epi-retinal membrane; PVD posterior vitreous detachment; RD retinal detachment; DM diabetes mellitus; DR diabetic retinopathy; NPDR non-proliferative diabetic retinopathy; PDR proliferative diabetic retinopathy; CSME clinically significant macular edema; DME diabetic macular edema; dbh dot blot hemorrhages; CWS cotton wool spot; POAG primary open angle glaucoma; C/D cup-to-disc ratio; HVF humphrey visual field; GVF goldmann visual field; OCT optical coherence tomography; IOP intraocular pressure; BRVO Branch retinal vein occlusion; CRVO central retinal vein occlusion; CRAO central retinal artery occlusion; BRAO branch retinal artery occlusion; RT retinal tear; SB scleral buckle; PPV pars plana vitrectomy; VH Vitreous hemorrhage; PRP panretinal laser photocoagulation; IVK intravitreal kenalog; VMT vitreomacular traction; MH Macular hole;  NVD neovascularization of the disc; NVE neovascularization elsewhere; AREDS age related eye disease study; ARMD age related macular degeneration; POAG primary open angle glaucoma; EBMD epithelial/anterior basement membrane dystrophy; ACIOL anterior chamber intraocular lens; IOL intraocular lens; PCIOL posterior chamber intraocular lens;  Phaco/IOL phacoemulsification with intraocular lens placement; [place] photorefractive keratectomy; LASIK laser assisted in situ keratomileusis; HTN hypertension; DM diabetes mellitus; COPD chronic obstructive pulmonary disease

## 2017-01-13 ENCOUNTER — Encounter (HOSPITAL_COMMUNITY)
Admission: RE | Admit: 2017-01-13 | Discharge: 2017-01-13 | Disposition: A | Discharge status: Home / Self Care | Payer: Medicare Other | Source: Ambulatory Visit | Attending: Nephrology | Admitting: Nephrology

## 2017-01-13 VITALS — BP 170/83 | HR 68 | Resp 68

## 2017-01-13 DIAGNOSIS — N183 Chronic kidney disease, stage 3 unspecified: Secondary | ICD-10-CM

## 2017-01-13 DIAGNOSIS — D631 Anemia in chronic kidney disease: Secondary | ICD-10-CM | POA: Diagnosis not present

## 2017-01-13 LAB — RENAL FUNCTION PANEL
Albumin: 3.4 g/dL — ABNORMAL LOW (ref 3.5–5.0)
Anion gap: 7 (ref 5–15)
BUN: 37 mg/dL — AB (ref 6–20)
CALCIUM: 8.9 mg/dL (ref 8.9–10.3)
CHLORIDE: 103 mmol/L (ref 101–111)
CO2: 33 mmol/L — AB (ref 22–32)
CREATININE: 1.68 mg/dL — AB (ref 0.44–1.00)
GFR calc Af Amer: 34 mL/min — ABNORMAL LOW (ref >60)
GFR, EST NON AFRICAN AMERICAN: 30 mL/min — AB (ref >60)
Glucose, Bld: 261 mg/dL — ABNORMAL HIGH (ref 65–99)
Phosphorus: 3.1 mg/dL (ref 2.5–4.6)
Potassium: 3.7 mmol/L (ref 3.5–5.1)
SODIUM: 143 mmol/L (ref 135–145)

## 2017-01-13 LAB — POCT HEMOGLOBIN-HEMACUE: Hemoglobin: 11.2 g/dL — ABNORMAL LOW (ref 12.0–15.0)

## 2017-01-13 LAB — IRON AND TIBC
IRON: 100 ug/dL (ref 28–170)
Saturation Ratios: 45 % — ABNORMAL HIGH (ref 10.4–31.8)
TIBC: 224 ug/dL — AB (ref 250–450)
UIBC: 124 ug/dL

## 2017-01-13 LAB — FERRITIN: FERRITIN: 653 ng/mL — AB (ref 11–307)

## 2017-01-13 MED ORDER — EPOETIN ALFA 20000 UNIT/ML IJ SOLN
INTRAMUSCULAR | Status: AC
Start: 2017-01-13 — End: 2017-01-13
  Administered 2017-01-13: 11:00:00 20000 [IU] via SUBCUTANEOUS
  Filled 2017-01-13: qty 1

## 2017-01-13 MED ORDER — EPOETIN ALFA 10000 UNIT/ML IJ SOLN
INTRAMUSCULAR | Status: AC
  Administered 2017-01-13: 10000 [IU] via SUBCUTANEOUS
  Filled 2017-01-13: qty 1

## 2017-01-13 MED ORDER — EPOETIN ALFA 40000 UNIT/ML IJ SOLN: 30000.0000 [IU] | INTRAMUSCULAR | Status: DC

## 2017-01-14 LAB — PTH, INTACT AND CALCIUM
Calcium, Total (PTH): 9 mg/dL (ref 8.7–10.3)
PTH: 80 pg/mL — ABNORMAL HIGH (ref 15–65)

## 2017-01-17 ENCOUNTER — Encounter (HOSPITAL_COMMUNITY): Payer: Medicare Other

## 2017-01-22 ENCOUNTER — Encounter (INDEPENDENT_AMBULATORY_CARE_PROVIDER_SITE_OTHER): Payer: Medicare Other | Admitting: Ophthalmology

## 2017-01-24 ENCOUNTER — Inpatient Hospital Stay (HOSPITAL_COMMUNITY)
Admission: RE | Admit: 2017-01-24 | Discharge: 2017-01-24 | Disposition: A | Discharge status: Home / Self Care | Payer: Medicare Other | Source: Ambulatory Visit | Attending: Nephrology | Admitting: Nephrology

## 2017-01-28 ENCOUNTER — Encounter (HOSPITAL_COMMUNITY): Payer: Medicare Other

## 2017-01-28 ENCOUNTER — Encounter (HOSPITAL_COMMUNITY)
Admission: RE | Admit: 2017-01-28 | Discharge: 2017-01-28 | Disposition: A | Discharge status: Home / Self Care | Payer: Medicare Other | Source: Ambulatory Visit | Attending: Nephrology | Admitting: Nephrology

## 2017-01-28 VITALS — BP 177/90 | HR 73 | Temp 97.8°F | Resp 20

## 2017-01-28 DIAGNOSIS — N183 Chronic kidney disease, stage 3 unspecified: Secondary | ICD-10-CM

## 2017-01-28 DIAGNOSIS — D631 Anemia in chronic kidney disease: Secondary | ICD-10-CM | POA: Diagnosis not present

## 2017-01-28 LAB — POCT HEMOGLOBIN-HEMACUE: HEMOGLOBIN: 10 g/dL — AB (ref 12.0–15.0)

## 2017-01-28 MED ORDER — EPOETIN ALFA 20000 UNIT/ML IJ SOLN
INTRAMUSCULAR | Status: AC
  Administered 2017-01-28: 20000 [IU] via SUBCUTANEOUS
  Filled 2017-01-28: qty 1

## 2017-01-28 MED ORDER — EPOETIN ALFA 40000 UNIT/ML IJ SOLN: 30000.0000 [IU] | INTRAMUSCULAR | Status: DC

## 2017-01-28 MED ORDER — EPOETIN ALFA 10000 UNIT/ML IJ SOLN
INTRAMUSCULAR | Status: AC
  Administered 2017-01-28: 11:00:00 10000 [IU] via SUBCUTANEOUS
  Filled 2017-01-28: qty 1

## 2017-01-28 NOTE — Progress Notes (Signed)
[facility] Note  01/29/2017     CHIEF COMPLAINT Patient presents for Retina Follow Up   HISTORY OF PRESENT ILLNESS: Joann Gutierrez is a 72 y.o. female who presents to the clinic today for:   HPI    Retina Follow Up    Patient presents with  Other.  In left eye.  Severity is moderate.  Duration of 6 weeks.  Since onset it is stable.  I, the attending physician,  performed the HPI with the patient and updated documentation appropriately.          Comments    Pt presents today for F/U angiographic CME/focal chorioretinal inflammation/posterior uveitis OS, pt states her vision is stable, states she is seeing flashes/floaters and experiencing pain OS, pt denies wavy vision, pt states she is using prednisolone gtts and Liquifilm Tears, pts BS this AM was 70,        Last edited by Bernarda Caffey, MD on 01/29/2017 10:19 AM. (History)    Pt states that after having injection at last visit she saw a visual improvement; Pt reports she has difficultly with transportation to appointments;   Referring physician: Seward Carol, MD [address] [place], [place] [address]  HISTORICAL INFORMATION:   Selected notes from the MEDICAL RECORD NUMBER Referral from Dr. Read Drivers for r/o angiographic CME Ocular Hx - HZO OS; Pseudophakia OS (07/10/16); cellophane OS; HTN; PMH - DM II on insulin; Hx stroke;    CURRENT MEDICATIONS: Current Outpatient Medications (Ophthalmic Drugs)  Medication Sig  . ketorolac (ACULAR) 0.5 % ophthalmic solution PLACE ONE DROP INTO THE LEFT EYE FOUR TIMES A DAY  . nepafenac (ILEVRO) 0.3 % ophthalmic suspension Place 1 drop into the left eye daily.  . polyvinyl alcohol (LIQUIFILM TEARS) 1.4 % ophthalmic solution Place 1 drop into both eyes as needed for dry eyes.  . prednisoLONE acetate (PRED MILD) 0.12 % ophthalmic suspension Place 1 drop into the left eye 4 (four) times daily.   Current Facility-Administered Medications  (Ophthalmic Drugs)  Medication Route  . triamcinolone acetonide (TRIESENCE) 40 MG/ML subtenons injection 20 mg Subtenons   Current Outpatient Medications (Other)  Medication Sig  . aspirin EC 81 MG tablet Take 81 mg by mouth daily.  Marland Kitchen atorvastatin (LIPITOR) 40 MG tablet Take 40 mg by mouth daily.  . bacitracin ointment Apply topically 2 (two) times daily. Apply to the rash until resolved  . Calcium Carbonate-Vit D-Min (CALTRATE PLUS PO) Take 600 mg by mouth every morning.   . donepezil (ARICEPT) 10 MG tablet Take 10 mg by mouth at bedtime.   Marland Kitchen doxazosin (CARDURA) 2 MG tablet Take 4 mg by mouth at bedtime.   Marland Kitchen Epoetin Alfa (PROCRIT IJ) Inject 1 each as directed once a week.  . ezetimibe (ZETIA) 10 MG tablet Take 10 mg by mouth at bedtime.   . fluticasone (FLONASE) 50 MCG/ACT nasal spray PLACE TWO SPRAYS INTO BOTH NOSTRILS DAILY  . furosemide (LASIX) 80 MG tablet Take 1 tablet (80 mg total) by mouth daily.  Marland Kitchen gabapentin (NEURONTIN) 300 MG capsule Take 1 capsule (300 mg total) by mouth 2 (two) times daily.  Marland Kitchen guaiFENesin (MUCINEX) 600 MG 12 hr tablet Take 2 tablets (1,200 mg total) by mouth 2 (two) times daily as needed for cough or to loosen phlegm.  . hydrALAZINE (APRESOLINE) 100 MG tablet Take 100 mg by mouth 3 (three) times daily.  . isosorbide mononitrate (IMDUR) 120 MG 24 hr tablet Take  1 tablet (120 mg total) by mouth daily. PATIENT NEEDS TO CONTACT OFFICE FOR ADDITIONAL REFILLS  . KLOR-CON M20 20 MEQ tablet Take 20 mEq by mouth daily.   Marland Kitchen LEVEMIR FLEXTOUCH 100 UNIT/ML Pen Inject 15 Units as directed every morning.  . mometasone-formoterol (DULERA) 100-5 MCG/ACT AERO Inhale 2 puffs into the lungs 2 (two) times daily.  . montelukast (SINGULAIR) 10 MG tablet TAKE ONE TABLET BY MOUTH EVERY DAY.  . montelukast (SINGULAIR) 10 MG tablet TAKE ONE TABLET BY MOUTH EVERY DAY.  . Multiple Vitamins-Minerals (MULTIVITAMIN GUMMIES ADULT) CHEW Chew 1 capsule by mouth daily.  . nitroGLYCERIN  (NITROSTAT) 0.4 MG SL tablet PLACE ONE TABLET UNDER THE TONGUE EVERY FIVE MINUTES AS NEEDED FOR CHEST PAIN  . omeprazole (PRILOSEC) 20 MG capsule Take 1 capsule (20 mg total) by mouth 2 (two) times daily before a meal.  . omeprazole (PRILOSEC) 40 MG capsule TAKE ONE CAPSULE BY MOUTH DAILY  . ondansetron (ZOFRAN ODT) 4 MG disintegrating tablet Take 1 tablet (4 mg total) by mouth every 4 (four) hours as needed for nausea or vomiting.  . OXYGEN Inhale into the lungs. CPAP with oxygen at bedtime  . raloxifene (EVISTA) 60 MG tablet Take 60 mg by mouth every morning.   . traMADol (ULTRAM) 50 MG tablet Take 1 tablet (50 mg total) by mouth every 8 (eight) hours as needed for moderate pain or severe pain. Reported on 02/07/2015  . valACYclovir (VALTREX) 500 MG tablet Take 1 tablet (500 mg total) by mouth 3 (three) times daily.  . VENTOLIN HFA 108 (90 Base) MCG/ACT inhaler INHALE 2 PUFFS INTO THE LUNGS EVERY SIX HOURS AS NEEDED FOR SHORTNESSOF BREATH  . Vitamin D, Ergocalciferol, (DRISDOL) 50000 UNITS CAPS capsule Take 50,000 Units by mouth every Monday.    No current facility-administered medications for this visit.  (Other)      REVIEW OF SYSTEMS: ROS    Positive for: Endocrine, Eyes   Negative for: Constitutional, Gastrointestinal, Neurological, Skin, Genitourinary, Musculoskeletal, HENT, Cardiovascular, Respiratory, Psychiatric, Allergic/Imm, Heme/Lymph   Last edited by Debbrah Alar, COT on 01/29/2017  9:08 AM. (History)       ALLERGIES Allergies  Allergen Reactions  . Morphine And Related Other (See Comments)    Family request not to be given, reports pt does not wake up when given   . Promethazine Hcl Other (See Comments)    REACTION: lethargy    PAST MEDICAL HISTORY Past Medical History:  Diagnosis Date  . Adenomatous colon polyp   . Allergy   . Anemia   . Asthma       . CAD (coronary artery disease)    Mild very minimal coronary disease with 20% obtuse marginal stenosis  .  Carpal tunnel syndrome on left   . CHF (congestive heart failure) ([place])   . Chronic kidney disease (CKD), stage III (moderate) (HCC)   . COPD (chronic obstructive pulmonary disease) ([place])   . CVA (cerebral infarction)   . Depression   . 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 Diverticulosis   . 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 Esophageal dysmotility   . Esophageal stricture   . Gastritis   . Gastroparesis 08/21/2007  . GERD (gastroesophageal reflux disease)   . Hair loss   . Hearing loss of both ears   . Hernia, hiatal   . Hyperlipidemia   . Hypertension   . Morbid obesity ([place])   . Obesity   . Osteoarthritis   .  OSTEOARTHRITIS 08/09/2006  . Osteoporosis   . 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 RENAL INSUFFICIENCY 02/16/2009  . 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 Seizures ([place])    pt thinks it has been several monthes since she had a seisure  . Shingles   . Sickle cell trait ([place])   . Stroke ([place])   . Tubular adenoma of colon    Past Surgical History:  Procedure Laterality Date  . ABDOMINAL HYSTERECTOMY    . ARTERY BIOPSY  01/07/2011   Procedure: MINOR BIOPSY TEMPORAL ARTERY;  Surgeon: Haywood Lasso, MD;  Location: [facility];  Service: General;  Laterality: Left;  left temporal artery biopsy  . bil foot surgery    . BREAST LUMPECTOMY     benign  . BREAST LUMPECTOMY     both breast lumps removed   . CATARACT EXTRACTION Left 06/2016   Dr. Read Drivers  . COLONOSCOPY    . COLONOSCOPY WITH PROPOFOL N/A 07/05/2015   Procedure: COLONOSCOPY WITH PROPOFOL;  Surgeon: Manus Gunning, MD;  Location: [facility];  Service: Gastroenterology;  Laterality: N/A;  . ERD  08/08/2000  . ESOPHAGEAL MANOMETRY N/A 03/13/2015   Procedure: ESOPHAGEAL MANOMETRY (EM);  Surgeon: Manus Gunning, MD;  Location: [facility];  Service: Gastroenterology;  Laterality: N/A;  . ESOPHAGOGASTRODUODENOSCOPY  06/25/2006  .  ESOPHAGOGASTRODUODENOSCOPY (EGD) WITH PROPOFOL N/A 07/05/2015   Procedure: ESOPHAGOGASTRODUODENOSCOPY (EGD) WITH PROPOFOL;  Surgeon: Manus Gunning, MD;  Location: [facility];  Service: Gastroenterology;  Laterality: N/A;  . HERNIA REPAIR    . LEFT AND RIGHT HEART CATHETERIZATION WITH CORONARY ANGIOGRAM N/A 03/03/2013   Procedure: LEFT AND RIGHT HEART CATHETERIZATION WITH CORONARY ANGIOGRAM;  Surgeon: Minus Breeding, MD;  Location: [facility];  Service: Cardiovascular;  Laterality: N/A;  . REPLACEMENT TOTAL KNEE Left 1998  . UPPER GASTROINTESTINAL ENDOSCOPY      FAMILY HISTORY Family History  Problem Relation Age of Onset  . Liver cancer Mother        Liver Cancer  . Diabetes Mother   . Kidney disease Mother   . Heart disease Mother        age 96's  . Heart disease Father 37       MI  . Heart attack Father        died of MI when pt was 22  . Sickle cell anemia Father   . Colon cancer Brother   . Cancer Brother        Colon Cancer  . Diabetes Sister   . Kidney disease Sister   . Heart disease Sister        age 60's  . Allergies Sister   . Diabetes Sister   . Kidney disease Sister   . Heart disease Sister        age 79's  . Esophageal cancer Neg Hx   . Rectal cancer Neg Hx   . Stomach cancer Neg Hx   . Amblyopia Neg Hx   . Blindness Neg Hx   . Glaucoma Neg Hx   . Macular degeneration Neg Hx   . Retinal detachment Neg Hx   . Cataracts Neg Hx   . Strabismus Neg Hx   . Retinitis pigmentosa Neg Hx     SOCIAL HISTORY Social History   Tobacco Use  . Smoking status: Former Smoker    Packs/day: 0.50    Years: 10.00    Pack years: 5.00    Last attempt to quit: 04/18/1980  Years since quitting: 36.8  . Smokeless tobacco: Never Used  Substance Use Topics  . Alcohol use: No    Alcohol/week: 0.0 oz  . Drug use: No         OPHTHALMIC EXAM:  Base Eye Exam    Visual Acuity (Snellen - Linear)      Right Left   Dist cc 20/40 -2 20/40   Dist ph cc NI  20/30 -1   Correction:  Glasses       Tonometry (Tonopen, 9:18 AM)      Right Left   Pressure 18 20       Pupils      Dark Light Shape React APD   Right 3 3 Round Minimal None   Left 3 3 Round Minimal None       Visual Fields (Counting fingers)      Left Right    Full Full       Extraocular Movement      Right Left    Full, Ortho Full, Ortho       Neuro/Psych    Oriented x3:  Yes   Mood/Affect:  Normal       Dilation    Both eyes:  1.0% Mydriacyl, 2.5% Phenylephrine @ 9:18 AM        Slit Lamp and Fundus Exam    External Exam      Right Left   External Normal Normal       Slit Lamp Exam      Right Left   Lids/Lashes Dermatochalasis - upper lid Dermatochalasis - upper lid   Conjunctiva/Sclera melanosis  White and quiet   Cornea Clear well healed temporal insision   Anterior Chamber Deep and quiet Deep and clear   Iris Round and dilated; no NVI dilates to 4.25 mm; no NVI   Lens 2-3+ Nuclear sclerosis with mild brunescence, 2+ Cortical cataract Posterior chamber intraocular lens in good position    Vitreous Vitreous syneresis Vitreous syneresis - mild; 1+ anterior pigment       Fundus Exam      Right Left   Disc Normal Normal   C/D Ratio 0.45 0.5   Macula Flat; mild RPE change, trace MA Trace ERM; trace MA; mild RPE changes;    Vessels Vascular attenuation - mild; AV crossing changes AV crossing changes; mild venous dilation, Copper wiring   Periphery Attached attached; superior nasal blot hemes          IMAGING AND PROCEDURES  Imaging and Procedures for 01/29/17  OCT, Retina - OU - Both Eyes     Right Eye Quality was good. Central Foveal Thickness: 242. Progression has been stable. Findings include normal foveal contour, no IRF, no SRF.   Left Eye Quality was good. Central Foveal Thickness: 262. Progression has been stable. Findings include normal foveal contour, no SRF, no IRF.   Notes Images taken, stored on drive  Diagnosis / Impression:   No macular edema, IRF, SRF OU Tr cystic changes OS  Clinical management:  See below  Abbreviations: NFP - Normal foveal profile. CME - cystoid macular edema.  PED - pigment epithelial detachment. IRF - intraretinal fluid. SRF -  subretinal fluid. EZ - ellipsoid zone. ERM - epiretinal membrane. ORA -  outer retinal atrophy. ORT - outer retinal tubulation. SRHM - subretinal  hyper-reflective material        Fluorescein Angiography [place] (Transit OS)     Right Eye Progression has been stable. Early phase findings include  microaneurysm. Mid/Late phase findings include microaneurysm, leakage.   Left Eye Progression has been stable. Early phase findings include microaneurysm, leakage. Mid/Late phase findings include leakage, microaneurysm.   Notes OD: scattered, punctate areas of hyperfluorescence consistent with MAs OS: focal areas of hyperfluorescent leakage around disc and scattered throughout macula; scattered Mas -- interval improvement from prior study                ASSESSMENT/PLAN:    ICD-10-CM   1. 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 Retinal edema H35.81 Fluorescein Angiography [place] (Transit OS)  4. Mild nonproliferative diabetic retinopathy of both eyes without macular edema associated with type 2 diabetes mellitus ([place]) E33.2951   5. Pseudophakia, left eye Z96.1   6. Senile nuclear sclerosis, right H25.11     1,2,3. Angiographic CME / focal chorioretinal inflammation / Posterior uveitis OS  - tr cystic changes on OCT, however FA shows some focal areas of late leakage from disc, macula consistent with chorioretinal inflammation  - etiology likely multifactorial with history of herpes zoster, CE/PCIOL 4 mos ago, and DM2  - currently on po valtrex (500 mg po TID),  prednisolone acetate, and ketorolac QID OS  - S/P Subtenon's Kenalog OS (11.28.18)  - pt's BCVA improved to 20/30 and pt notes subjective improvement  - repeat FA today, 1.9.19, shows interval improvement in angiographic leakage  - discussed treatment options -- subtenon's kenalog vs mild immunosuppression with methotrexate vs observation -- pt wishes to monitor for now  - F/U 6 wks with repeat FA  4. mild Non-Proliferative diabetic retinopathy, both eyes  - The incidence, risk factors for progression, natural history and treatment options for diabetic retinopathy  were discussed with patient.  The need for close monitoring of blood glucose, blood pressure, and serum lipids, avoiding cigarette or any type of tobacco, and the need for long term follow up was also discussed with patient.  - as above, may be contributing to inflammatory picture OS  5. Pseudophakia OS  - IOL in good position -- beautiful surgery by Dr. Kathlen Mody  - no frank CME on OCT, but mildly present on FA -- management as above  - will continue to monitor  6. Senile nuclear sclerosis OD  - The symptoms of cataract, surgical options, and treatments and risks were discussed with patient.  - discussed diagnosis and progression  - under the expert care of Dr. Kathlen Mody   - management per Dr. Kathlen Mody    Ophthalmic Meds Ordered this visit:  No orders of the defined types were placed in this encounter.      Return in about 6 weeks (around 03/12/2017) for F/U CME/focal chorioretinal inflammation/posterior uveitis OS.  There are no Patient Instructions on file for this visit.   Explained the diagnoses, plan, and follow up with the patient and they expressed understanding.  Patient expressed understanding of the importance of proper follow up care.   This document serves as a record of services personally performed by Gardiner Sleeper, MD, PhD. It was created on their behalf by Catha Brow, [place], a certified ophthalmic  assistant. The creation of this record is the provider's dictation and/or activities during the visit.  Electronically signed by: Catha Brow, COA  01/29/17 1:10 PM    Gardiner Sleeper, M.D., Ph.D. Diseases & Surgery of the Retina and Vitreous  [place] 01/29/17   I have reviewed the above documentation for accuracy and completeness, and I agree with the above. Gardiner Sleeper, M.D., Ph.D. 01/29/17 1:20 PM     Abbreviations: M myopia (nearsighted); A astigmatism; H hyperopia (farsighted); P presbyopia; Mrx spectacle prescription;  CTL contact lenses; OD right eye; OS left eye; OU both eyes  XT exotropia; ET esotropia; PEK punctate epithelial keratitis; PEE punctate epithelial erosions; DES dry eye syndrome; MGD meibomian gland dysfunction; ATs artificial tears; PFAT's preservative free artificial tears; [place] nuclear sclerotic cataract; PSC posterior subcapsular cataract; ERM epi-retinal membrane; PVD posterior vitreous detachment; RD retinal detachment; DM diabetes mellitus; DR diabetic retinopathy; NPDR non-proliferative diabetic retinopathy; PDR proliferative diabetic retinopathy; CSME clinically significant macular edema; DME diabetic macular edema; dbh dot blot hemorrhages; CWS cotton wool spot; POAG primary open angle glaucoma; C/D cup-to-disc ratio; HVF humphrey visual field; GVF goldmann visual field; OCT optical coherence tomography; IOP intraocular pressure; BRVO Branch retinal vein occlusion; CRVO central retinal vein occlusion; CRAO central retinal artery occlusion; BRAO branch retinal artery occlusion; RT retinal tear; SB scleral buckle; PPV pars plana vitrectomy; VH Vitreous hemorrhage; PRP panretinal laser photocoagulation; IVK intravitreal kenalog; VMT vitreomacular traction; MH Macular hole;  NVD neovascularization of the disc; NVE neovascularization elsewhere; AREDS age related eye disease study; ARMD age related macular degeneration; POAG primary open  angle glaucoma; EBMD epithelial/anterior basement membrane dystrophy; ACIOL anterior chamber intraocular lens; IOL intraocular lens; PCIOL posterior chamber intraocular lens; Phaco/IOL phacoemulsification with intraocular lens placement; [place] photorefractive keratectomy; LASIK laser assisted in situ keratomileusis; HTN hypertension; DM diabetes mellitus; COPD chronic obstructive pulmonary disease

## 2017-01-29 ENCOUNTER — Ambulatory Visit (INDEPENDENT_AMBULATORY_CARE_PROVIDER_SITE_OTHER): Payer: Medicare Other | Admitting: Ophthalmology

## 2017-01-29 ENCOUNTER — Encounter (INDEPENDENT_AMBULATORY_CARE_PROVIDER_SITE_OTHER): Payer: Self-pay | Admitting: Ophthalmology

## 2017-01-29 DIAGNOSIS — H3092 Unspecified chorioretinal inflammation, left eye: Secondary | ICD-10-CM

## 2017-01-29 DIAGNOSIS — H2511 Age-related nuclear cataract, right eye: Secondary | ICD-10-CM | POA: Diagnosis not present

## 2017-01-29 DIAGNOSIS — Z961 Presence of intraocular lens: Secondary | ICD-10-CM

## 2017-01-29 DIAGNOSIS — H3581 Retinal edema: Secondary | ICD-10-CM | POA: Diagnosis not present

## 2017-01-29 DIAGNOSIS — H30002 Unspecified focal chorioretinal inflammation, left eye: Secondary | ICD-10-CM | POA: Diagnosis not present

## 2017-01-29 DIAGNOSIS — E113293 Type 2 diabetes mellitus with mild nonproliferative diabetic retinopathy without macular edema, bilateral: Secondary | ICD-10-CM

## 2017-01-31 ENCOUNTER — Encounter (HOSPITAL_COMMUNITY): Payer: Medicare Other

## 2017-02-05 ENCOUNTER — Telehealth: Payer: Self-pay | Admitting: Pulmonary Disease

## 2017-02-05 NOTE — Telephone Encounter (Signed)
Called pt who stated she has been having a prod. Cough with green mucus and wonders if it could be just a bad cold coming on.  Pt denies any fever.  States she cannot come in today due to transportation issues.  Appt scheduled with MW tomorrow for an acute visit at 11:30.  Pt expressed understanding.  Nothing further needed at this time.

## 2017-02-06 ENCOUNTER — Encounter: Payer: Self-pay | Admitting: Internal Medicine

## 2017-02-06 ENCOUNTER — Ambulatory Visit (INDEPENDENT_AMBULATORY_CARE_PROVIDER_SITE_OTHER): Payer: Medicare Other | Admitting: Internal Medicine

## 2017-02-06 VITALS — BP 152/84 | HR 78 | Ht 64.0 in | Wt 259.0 lb

## 2017-02-06 DIAGNOSIS — J449 Chronic obstructive pulmonary disease, unspecified: Secondary | ICD-10-CM | POA: Diagnosis not present

## 2017-02-06 DIAGNOSIS — J4489 Other specified chronic obstructive pulmonary disease: Secondary | ICD-10-CM

## 2017-02-06 MED ORDER — PREDNISONE 10 MG PO TABS: ORAL_TABLET | ORAL | 0 refills | Status: DC

## 2017-02-06 MED ORDER — MOMETASONE FURO-FORMOTEROL FUM 100-5 MCG/ACT IN AERO: 2.0000 | INHALATION_SPRAY | Freq: Two times a day (BID) | RESPIRATORY_TRACT | 0 refills | Status: DC

## 2017-02-06 NOTE — Progress Notes (Signed)
Subjective:    Patient ID: Joann Gutierrez, female    DOB: May 24, 1945    MRN: [id number]    Brief patient profile:  72 yobf remote smoker - quit 03/1980 with  MO, asthma, OSA, OHS, and rt hemidiaphragm elevation first noted November 2011.     TESTS: PSG 04/28/09 >> AHI 11, SpO2 low 68% Spirometry 07/05/10 >> FEV1 1.30 (64%), FEV1% 80 >> coughing during test Echo 05/21/11 >> EF 55 to 60%, mild LVH, mild/mod TR, PAS 45 mmHg ONO with CPAP and room air 10/22/12 >> Test time 10 hrs 59 min. Mean SpO2 92.4%, low SpO2 75%. Spent 16 min with SpO2 < 89%. RAST 12/11/12 >> negative, IgE 63.3 Echo 02/04/13 >> mod LVH, EF 65 to 82%, grade 1 diastolic dysfx, PAS 42 mmHg Lt, Rt Heart cath 03/03/13 >> RA 10, RV 38/7, PA 41/16 28, PCWP 13, LV 142/5, AO 137/76 PFT 03/17/13 >> FEV1 1.27 (67%), FEV1% 84, TLC 4.06 (80%), DLCO 57%, borderline BD response      05/20/2014 acute  ov/Sharay Bellissimo re: aeAB  Chief Complaint  Patient presents with  . Acute Visit    Pt c/o cough, wheezing, increased SOB and chest tightness for the past 5 days. Cough is prod with moderate green sputum. She is using albuterol inhaler 2 x daily on average.  normally uses nebulizer twice daily but has not worked for a month "making an odd sound" Thoroughly confused with meds/ names/ timing maint vs prn and worse at hs despite cpap. Still comfortable at rest even s saba  rec Augmentin 875 mg take one pill twice daily  X 10 days - take at breakfast and supper with large glass of water.  It would help reduce the usual side effects (diarrhea and yeast infections) if you ate cultured yogurt at lunch.  Prednisone 10 mg take  4 each am x 2 days,   2 each am x 2 days,  1 each am x 2 days and stop If needed nebulizer up to every 4 hours if needed for breathlessness  For cough use flutter valve      09/22/2014 f/u ov/Walterine Amodei re: uacs  /gastroparesis related ?  With no improvement in sob since last eval  Chief Complaint  Patient presents with  . Acute  Visit    Pt c/o increased SOB "since the last time I was here". She also c/o difficulty swallowing and getting choked on food for the past month.   all  symptoms resolve with cpap day or noct but doe is persistent daily then choking worse x one month Not at all clear what meds she takes / was on reglan not clear when she stopped it rec metaclopromide 10 mg (reglan) is 10 mg before meals and at bedtime Omeprazole 20 mg Take 30- 60 min before your first and last meals of the day  GERD diet     07/13/2015  f/u ov/Donnie Gedeon re: AB vs uacs / obesity / deconditioning  Chief Complaint  Patient presents with  . Follow-up    Breathing is "doing a little bit better"- no new co's.  She uses albuterol 2 x daily on average.   MMRC3 = can't walk 100 yards even at a slow pace at a flat grade s stopping due to sob  - can't do [employer] consistently   Very confused with meds  rec No change in medications/ keep working on weight loss   10/31/16 Sood ov  rec Can use mucinex twice per  day as needed to help loosen phlegm in your chest Dulera two puffs twice per day and rinse mouth after each use Singulair 10 mg pill nightly Ventolin two puffs every 6 hours as needed for cough, wheeze, or chest congestion      02/06/2017 acute extended ov/Jaclyne Haverstick re:  Acute cough / pt with ? Severity asthma and very poor baseline hfa Chief Complaint  Patient presents with  . Acute Visit    productive cough,green,white,thick mucus,SOB w/ activity,headaches,chest,nasal congestion,sore throat,has noticed feet/ankle swelling,has taken mucinex,robitussin DM and has not felt better  best days can do [employer] leaing on cart/ freq stop  = MMRC3 = can't walk 100 yards even at a slow pace at a flat grade s stopping due to sob   - ok resting sitting/ lie down R side at 30 degrees  Cough immediately worse lying down - also on 02 at hs 2lpm  Much worse x 5 days with now green mucu prod esp in am and sore throat/ still not sob at rest  though sitting   No obvious patterns in  day to day or daytime variability or assoc   mucus plugs or hemoptysis or cp or chest tightness,   overt   hb symptoms. No unusual exposure hx or h/o childhood pna/ asthma or knowledge of premature birth.   . Also denies any obvious fluctuation of symptoms with weather or environmental changes or other aggravating or alleviating factors except as outlined above   Current Allergies, Complete Past Medical History, Past Surgical History, Family History, and Social History were reviewed in [employer] [employer] record.  ROS  The following are not active complaints unless bolded Hoarseness, sore throat, dysphagia, dental problems, itching, sneezing,  nasal congestion or discharge of excess mucus or purulent secretions, ear aches,   fever, chills, sweats, unintended wt loss or wt gain, classically pleuritic or exertional cp,  orthopnea pnd or leg swelling, presyncope, palpitations, abdominal pain, anorexia, nausea, vomiting, diarrhea  or change in bowel habits or change in bladder habits, change in stools or change in urine, dysuria, hematuria,  rash, arthralgias, visual complaints, headache, numbness, weakness or ataxia or problems with walking or coordination,  change in mood/affect or memory.        Current Meds - not able to verify accuracy   Medication Sig  . aspirin EC 81 MG tablet Take 81 mg by mouth daily.  Marland Kitchen atorvastatin (LIPITOR) 40 MG tablet Take 40 mg by mouth daily.  . bacitracin ointment Apply topically 2 (two) times daily. Apply to the rash until resolved  . Calcium Carbonate-Vit D-Min (CALTRATE PLUS PO) Take 600 mg by mouth every morning.   . donepezil (ARICEPT) 10 MG tablet Take 10 mg by mouth at bedtime.   Marland Kitchen doxazosin (CARDURA) 2 MG tablet Take 4 mg by mouth at bedtime.   Marland Kitchen Epoetin Alfa (PROCRIT IJ) Inject 1 each as directed once a week.  . ezetimibe (ZETIA) 10 MG tablet Take 10 mg by mouth at bedtime.   . fluticasone (FLONASE)  50 MCG/ACT nasal spray PLACE TWO SPRAYS INTO BOTH NOSTRILS DAILY  . furosemide (LASIX) 80 MG tablet Take 1 tablet (80 mg total) by mouth daily.  Marland Kitchen gabapentin (NEURONTIN) 300 MG capsule Take 1 capsule (300 mg total) by mouth 2 (two) times daily.  Marland Kitchen guaiFENesin (MUCINEX) 600 MG 12 hr tablet Take 2 tablets (1,200 mg total) by mouth 2 (two) times daily as needed for cough or to loosen phlegm.  . hydrALAZINE (  APRESOLINE) 100 MG tablet Take 100 mg by mouth 3 (three) times daily.  . isosorbide mononitrate (IMDUR) 120 MG 24 hr tablet Take 1 tablet (120 mg total) by mouth daily. PATIENT NEEDS TO CONTACT OFFICE FOR ADDITIONAL REFILLS  . ketorolac (ACULAR) 0.5 % ophthalmic solution PLACE ONE DROP INTO THE LEFT EYE FOUR TIMES A DAY  . KLOR-CON M20 20 MEQ tablet Take 20 mEq by mouth daily.   Marland Kitchen LEVEMIR FLEXTOUCH 100 UNIT/ML Pen Inject 15 Units as directed every morning.  . mometasone-formoterol (DULERA) 100-5 MCG/ACT AERO Inhale 2 puffs into the lungs 2 (two) times daily.  . montelukast (SINGULAIR) 10 MG tablet TAKE ONE TABLET BY MOUTH EVERY DAY.  . Multiple Vitamins-Minerals (MULTIVITAMIN GUMMIES ADULT) CHEW Chew 1 capsule by mouth daily.  . nepafenac (ILEVRO) 0.3 % ophthalmic suspension Place 1 drop into the left eye daily.  . nitroGLYCERIN (NITROSTAT) 0.4 MG SL tablet PLACE ONE TABLET UNDER THE TONGUE EVERY FIVE MINUTES AS NEEDED FOR CHEST PAIN  . omeprazole (PRILOSEC) 40 MG capsule TAKE ONE CAPSULE BY MOUTH DAILY  . ondansetron (ZOFRAN ODT) 4 MG disintegrating tablet Take 1 tablet (4 mg total) by mouth every 4 (four) hours as needed for nausea or vomiting.  . OXYGEN Inhale into the lungs. CPAP with oxygen at bedtime  . polyvinyl alcohol (LIQUIFILM TEARS) 1.4 % ophthalmic solution Place 1 drop into both eyes as needed for dry eyes.  . prednisoLONE acetate (PRED MILD) 0.12 % ophthalmic suspension Place 1 drop into the left eye 4 (four) times daily.  . raloxifene (EVISTA) 60 MG tablet Take 60 mg by mouth every  morning.   . valACYclovir (VALTREX) 500 MG tablet Take 1 tablet (500 mg total) by mouth 3 (three) times daily.  . VENTOLIN HFA 108 (90 Base) MCG/ACT inhaler INHALE 2 PUFFS INTO THE LUNGS EVERY SIX HOURS AS NEEDED FOR SHORTNESSOF BREATH  . Vitamin D, Ergocalciferol, (DRISDOL) 50000 UNITS CAPS capsule Take 50,000 Units by mouth every Monday.   . [DISCONTINUED] mometasone-formoterol (DULERA) 100-5 MCG/ACT AERO Inhale 2 puffs into the lungs 2 (two) times daily.  .     .                    Objective:   Physical Exam  amb morbidly obese bf struggles to get out of chair and onto exam table s assistance   09/22/2014          288 >  07/13/2015   279 > 02/06/2017  259     05/20/14 278 lb (126.1 kg)  04/06/14 267 lb (121.11 kg)  03/04/14 267 lb 12.8 oz (121.473 kg)    Vital signs reviewed - Note on arrival 02 sats  99% on RA     Top denture/ 4 lower  pan exp wheee   HEENT: nl   oropharynx. Nl external ear canals without cough reflex- moderate bilateral non-specific turbinate edema  / top denture/ 4 lower teeth    NECK :  without JVD/Nodes/TM/ nl carotid upstrokes bilaterally   LUNGS: no acc muscle use,  Nl contour chest with upper and lower airway wheezing heard bilaterally mid exp    CV:  RRR  no s3 or murmur or increase in P2, and trace pitting ankle edema   ABD:  soft and nontender with nl inspiratory excursion in the supine position. No bruits or organomegaly appreciated, bowel sounds nl  MS:  Nl gait/ ext warm without deformities, calf tenderness, cyanosis or clubbing No obvious joint restrictions  SKIN: warm and dry without lesions    NEURO:  alert, approp, nl sensorium with  no motor or cerebellar deficits apparent.       CXR PA and Lateral:   02/06/2017 :    I personally reviewed images and agree with radiology impression as follows:        Assessment & Plan:

## 2017-02-06 NOTE — Patient Instructions (Addendum)
Prednisone Take 4 for three days 3 for three days 2 for three days 1 for three days and stop   Plan A = Automatic = Dulera 100 Take 2 puffs first thing in am and then another 2 puffs about 12 hours later.      Plan B = Backup Only use your albuterol as a rescue medication to be used if you can't catch your breath by resting or doing a relaxed purse lip breathing pattern.  - The less you use it, the better it will work when you need it. - Ok to use the inhaler up to 2 puffs  every 4 hours if you must but call for appointment if use goes up over your usual need - Don't leave home without it !!  (think of it like the spare tire for your car)   Plan C = Crisis - only use your albuterol nebulizer if you first try Plan B and it fails to help > ok to use the nebulizer up to every 4 hours but if start needing it regularly call for immediate appointment     Work on inhaler technique:  relax and gently blow all the way out then take a nice smooth deep breath back in, triggering the inhaler at same time you start breathing in.  Hold for up to 5 seconds if you can. Blow out thru nose. Rinse and gargle with water when done    If not able to do better with inhalers may need nebulizer next      See Tammy NP w/in 2 weeks with all your medications, even over the counter meds, separated in two separate bags, the ones you take no matter what vs the ones you stop once you feel better and take only as needed when you feel you need them.      Once you have seen Tammy and we are sure that we're all on the same page with your medication use and you are able to use all your medications correctly she will arrange follow up with Dr Halford Chessman

## 2017-02-07 ENCOUNTER — Encounter (HOSPITAL_COMMUNITY): Payer: Medicare Other

## 2017-02-07 ENCOUNTER — Encounter: Payer: Self-pay | Admitting: Internal Medicine

## 2017-02-07 NOTE — Assessment & Plan Note (Signed)
Spirometry 07/05/10 >> FEV1 1.30 (64%), FEV1% 80  - Spirometry 02/06/2017  FEV1 0.88 (49%)  Ratio 63 with f/v loop non-physiologic but min curvature in effort indep portion   - 02/06/2017  After extensive coaching inhaler device  effectiveness =    25% from a basline of 0   rec Prednisone 10 mg take  4 each am x 2 days,   2 each am x 2 days,  1 each am x 2 days and stop and bring back for med reconciliation before additional measure/ see if "Green sputum " resolves this time s more abx    However, I  very strongly suspect most of her problem is restrictive/ obesity -related but the part that is asthmatic is not being very well addressed with such poor hfa and if doesn't master by next ov we need to give strong consideration to starting bud/formoterl nebs in this setting  As dpi liable to make her airway  Component less stable   I had an extended discussion with the patient reviewing all relevant studies completed to date and  lasting 15 to 20 minutes of a 25 minute acute visit    Each maintenance medication was reviewed in detail including most importantly the difference between maintenance and prns and under what circumstances the prns are to be triggered using an action plan format that is not reflected in the computer generated alphabetically organized AVS.    Please see AVS for specific instructions unique to this visit that I personally wrote and verbalized to the the pt in detail and then reviewed with pt  by my nurse highlighting any  changes in therapy recommended at today's visit to their plan of care.

## 2017-02-07 NOTE — Assessment & Plan Note (Signed)
Body mass index is 44.46 kg/m.  -  trending down/ encouraged  Lab Results  Component Value Date   TSH 1.555 05/20/2015     Contributing to gerd risk/ doe/reviewed the need and the process to achieve and maintain neg calorie balance > defer f/u primary care including intermittently monitoring thyroid status

## 2017-02-11 ENCOUNTER — Encounter (HOSPITAL_COMMUNITY)
Admission: RE | Admit: 2017-02-11 | Discharge: 2017-02-11 | Disposition: A | Discharge status: Home / Self Care | Payer: Medicare Other | Source: Ambulatory Visit | Attending: Nephrology | Admitting: Nephrology

## 2017-02-11 VITALS — BP 175/90 | HR 71 | Resp 20

## 2017-02-11 DIAGNOSIS — N183 Chronic kidney disease, stage 3 unspecified: Secondary | ICD-10-CM

## 2017-02-11 DIAGNOSIS — D631 Anemia in chronic kidney disease: Secondary | ICD-10-CM | POA: Diagnosis not present

## 2017-02-11 LAB — FERRITIN: Ferritin: 498 ng/mL — ABNORMAL HIGH (ref 11–307)

## 2017-02-11 LAB — RENAL FUNCTION PANEL
ALBUMIN: 3.2 g/dL — AB (ref 3.5–5.0)
Anion gap: 14 (ref 5–15)
BUN: 31 mg/dL — AB (ref 6–20)
CHLORIDE: 100 mmol/L — AB (ref 101–111)
CO2: 28 mmol/L (ref 22–32)
CREATININE: 1.42 mg/dL — AB (ref 0.44–1.00)
Calcium: 9.2 mg/dL (ref 8.9–10.3)
GFR, EST AFRICAN AMERICAN: 42 mL/min — AB (ref >60)
GFR, EST NON AFRICAN AMERICAN: 36 mL/min — AB (ref >60)
Glucose, Bld: 176 mg/dL — ABNORMAL HIGH (ref 65–99)
PHOSPHORUS: 3.1 mg/dL (ref 2.5–4.6)
Potassium: 3.4 mmol/L — ABNORMAL LOW (ref 3.5–5.1)
Sodium: 142 mmol/L (ref 135–145)

## 2017-02-11 LAB — IRON AND TIBC
IRON: 120 ug/dL (ref 28–170)
Saturation Ratios: 55 % — ABNORMAL HIGH (ref 10.4–31.8)
TIBC: 217 ug/dL — ABNORMAL LOW (ref 250–450)
UIBC: 97 ug/dL

## 2017-02-11 LAB — POCT HEMOGLOBIN-HEMACUE: Hemoglobin: 11.1 g/dL — ABNORMAL LOW (ref 12.0–15.0)

## 2017-02-11 MED ORDER — EPOETIN ALFA 20000 UNIT/ML IJ SOLN
INTRAMUSCULAR | Status: AC
  Administered 2017-02-11: 11:00:00 20000 [IU] via SUBCUTANEOUS
  Filled 2017-02-11: qty 1

## 2017-02-11 MED ORDER — EPOETIN ALFA 10000 UNIT/ML IJ SOLN
INTRAMUSCULAR | Status: AC
  Administered 2017-02-11: 10000 [IU] via SUBCUTANEOUS
  Filled 2017-02-11: qty 1

## 2017-02-11 MED ORDER — EPOETIN ALFA 40000 UNIT/ML IJ SOLN: 30000.0000 [IU] | INTRAMUSCULAR | Status: DC

## 2017-02-12 LAB — PTH, INTACT AND CALCIUM
CALCIUM TOTAL (PTH): 9.2 mg/dL (ref 8.7–10.3)
PTH: 122 pg/mL — AB (ref 15–65)

## 2017-02-14 ENCOUNTER — Encounter (HOSPITAL_COMMUNITY): Payer: Medicare Other

## 2017-02-20 DIAGNOSIS — N183 Chronic kidney disease, stage 3 (moderate): Secondary | ICD-10-CM | POA: Diagnosis not present

## 2017-02-20 DIAGNOSIS — E889 Metabolic disorder, unspecified: Secondary | ICD-10-CM | POA: Diagnosis not present

## 2017-02-20 DIAGNOSIS — M9080 Osteopathy in diseases classified elsewhere, unspecified site: Secondary | ICD-10-CM | POA: Diagnosis not present

## 2017-02-20 DIAGNOSIS — D638 Anemia in other chronic diseases classified elsewhere: Secondary | ICD-10-CM | POA: Diagnosis not present

## 2017-02-20 DIAGNOSIS — I129 Hypertensive chronic kidney disease with stage 1 through stage 4 chronic kidney disease, or unspecified chronic kidney disease: Secondary | ICD-10-CM | POA: Diagnosis not present

## 2017-02-20 IMAGING — CT CT MAXILLOFACIAL W/O CM
3 series · 15 of 47 positions shown, 18 images · non-contrast
Comparison: None.

CLINICAL DATA: 70 y/o F; left-sided facial swelling with onset
[REDACTED] is increasing and ulcer to tender skin on nose.

EXAM:
CT MAXILLOFACIAL WITHOUT CONTRAST
TECHNIQUE: Multidetector CT imaging of the maxillofacial structures was
performed. Multiplanar CT image reconstructions were also generated.
A small metallic BB was placed on the right temple in order to
reliably differentiate right from left.

[Series 2: facialbone 2.0 st · axial · 0.34mm/px · z∈[-246,-114]mm · 9 of 78 slices shown, 12 images]
[im 6/78  brain]
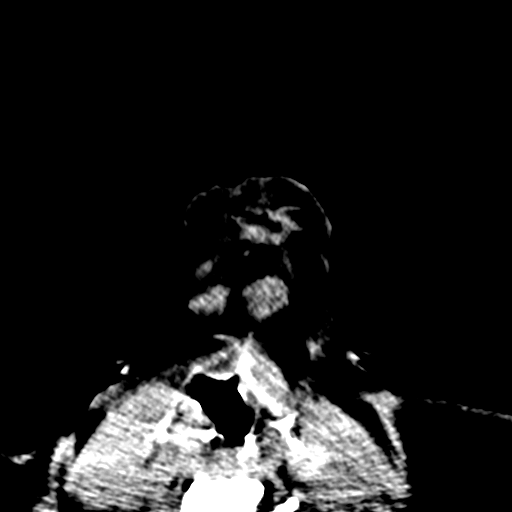
[im 6/78  bone]
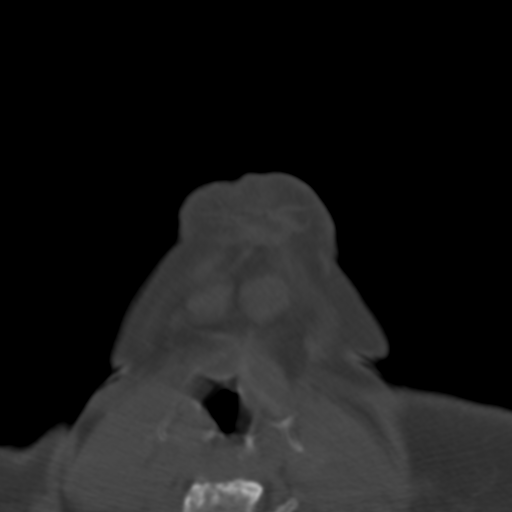
[im 14/78  bone]
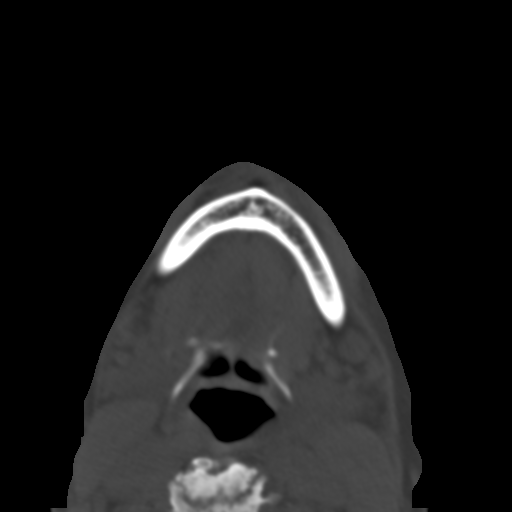
[im 22/78  bone]
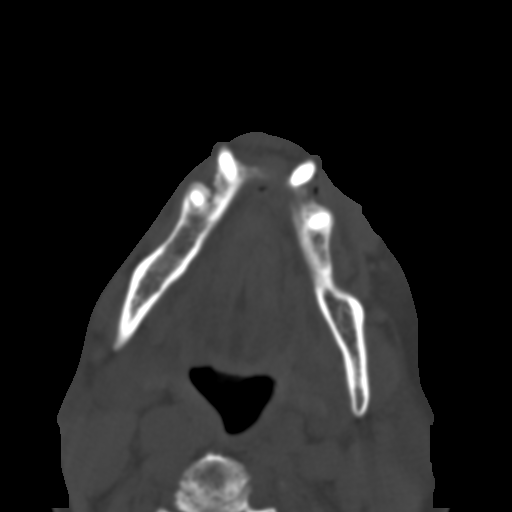
[im 30/78  bone]
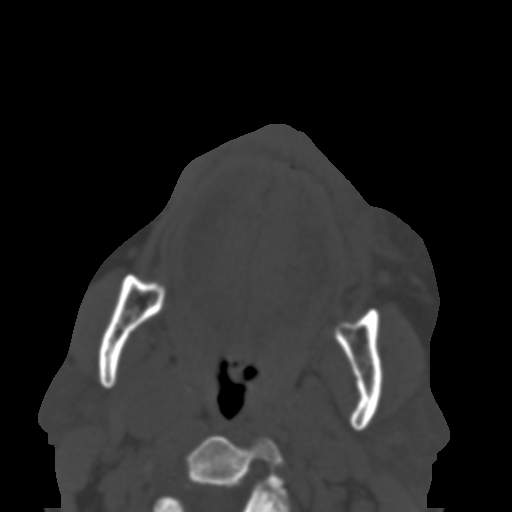
[im 40/78  brain]
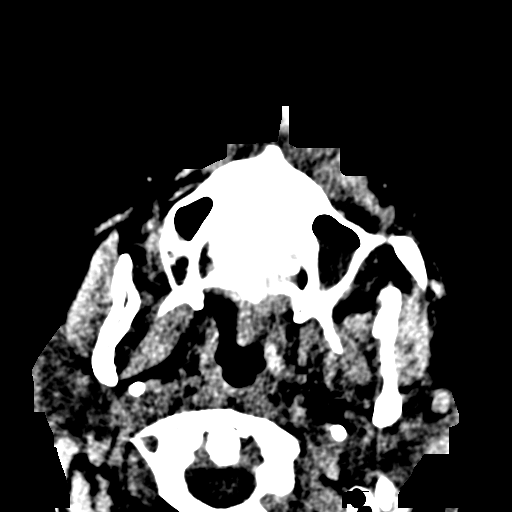
[im 40/78  bone]
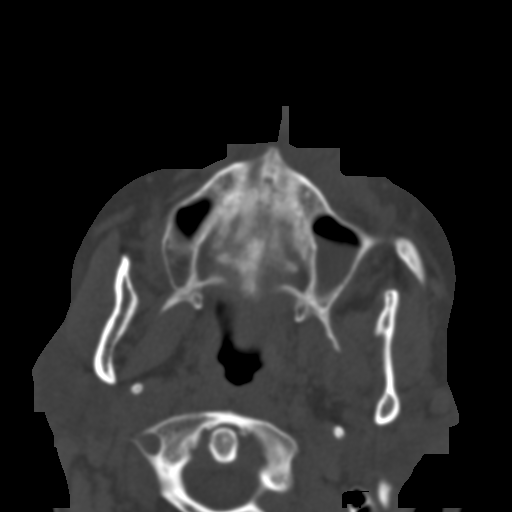
[im 48/78  bone]
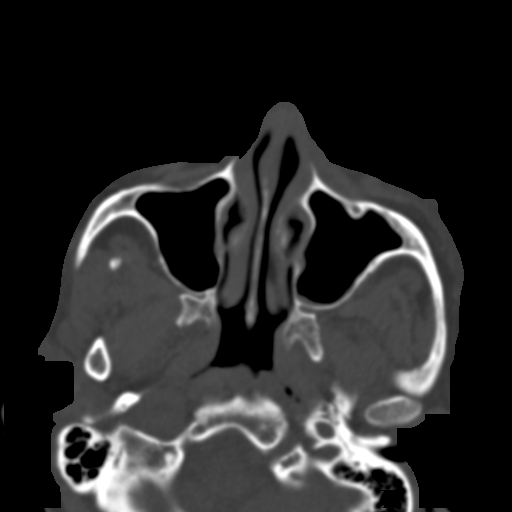
[im 56/78  bone]
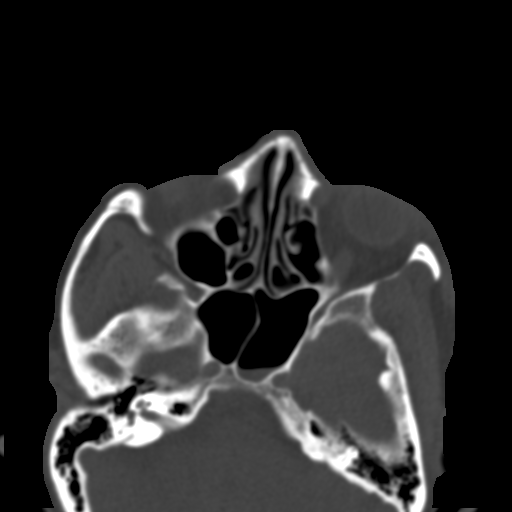
[im 64/78  bone]
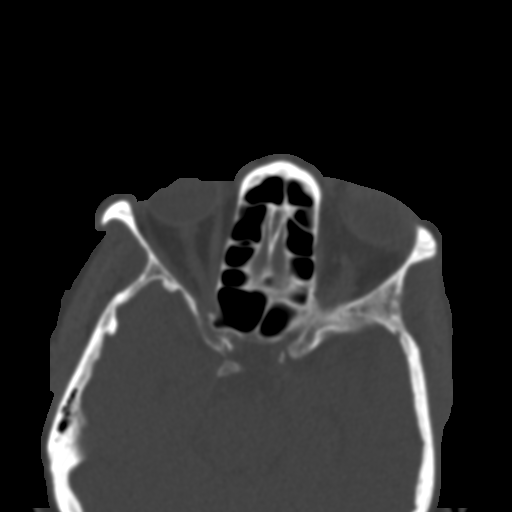
[im 72/78  brain]
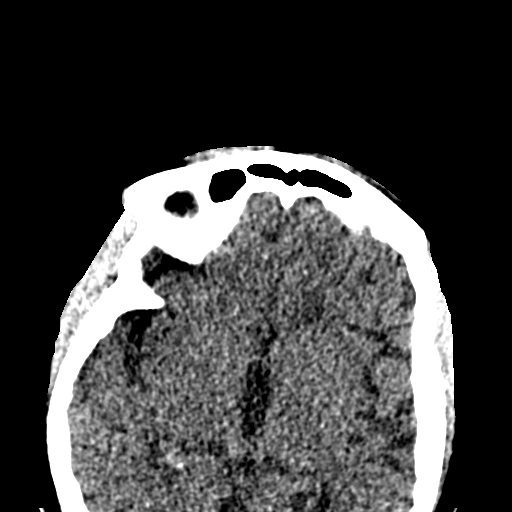
[im 72/78  bone]
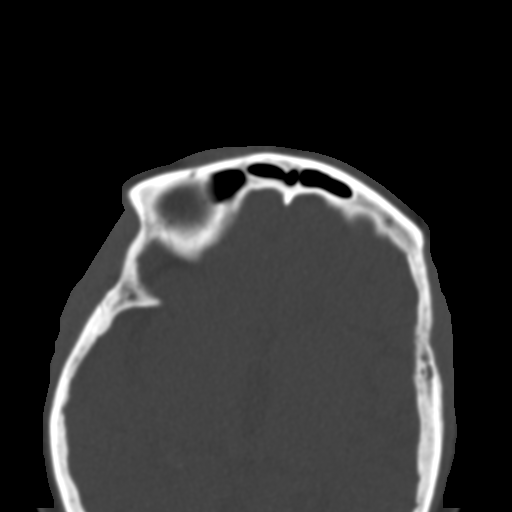

[Series 5: facialbone 2.0 cor st · coronal · 0.33mm/px · 3 of 63 slices shown]
[im 21/63  bone]
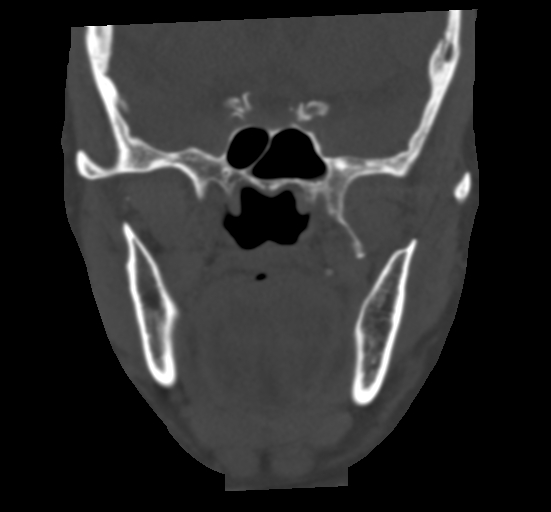
[im 28/63  bone]
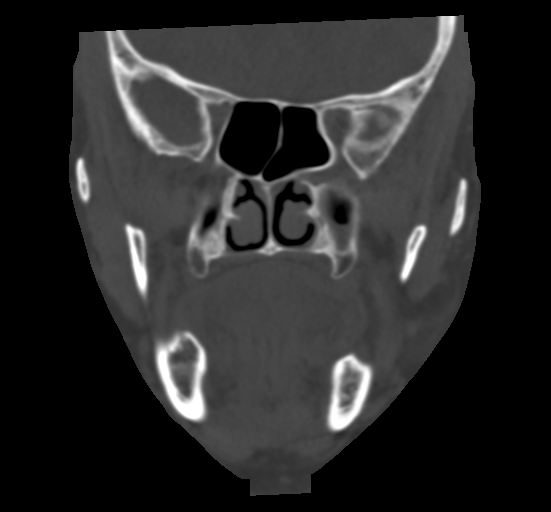
[im 35/63  bone]
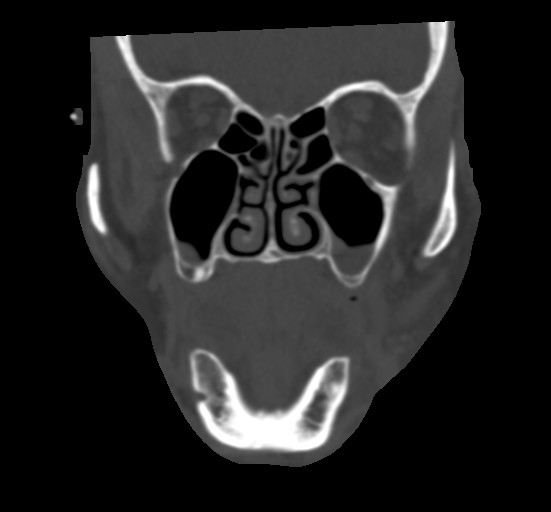

[Series 7: facialbone 2.0 sag st · sagittal · 0.26mm/px · 3 of 93 slices shown]
[im 31/93  bone]
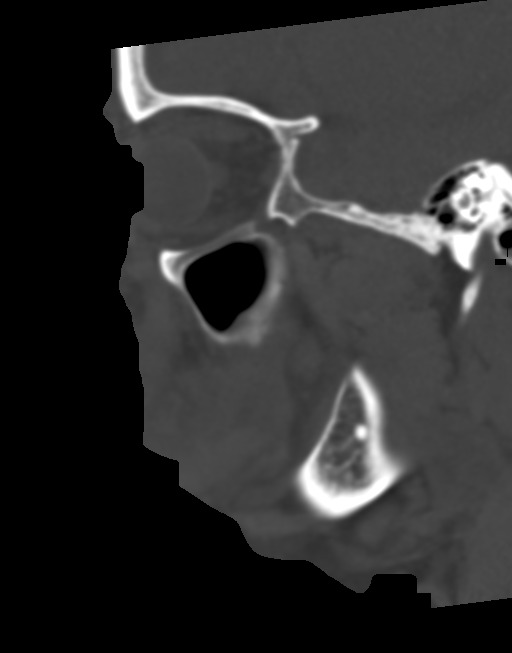
[im 47/93  bone]
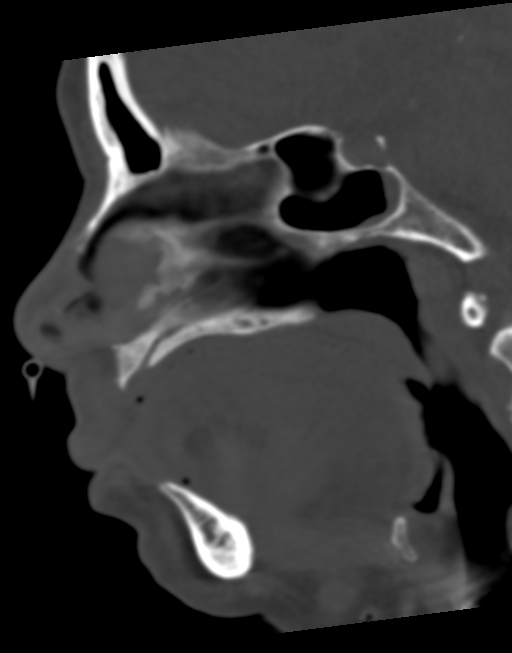
[im 62/93  bone]
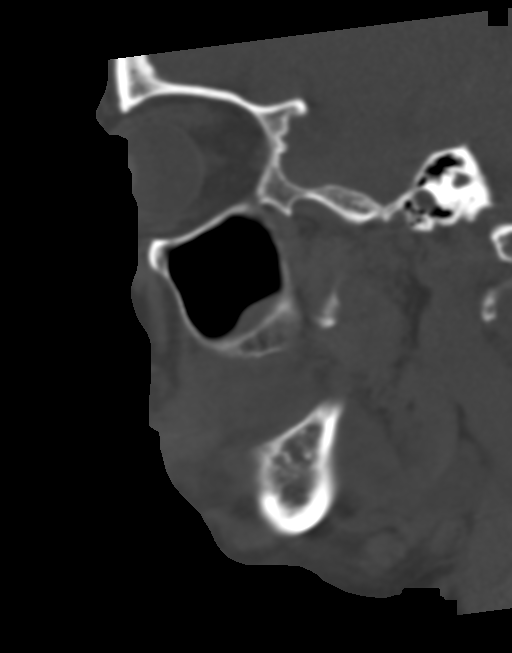

[15 of 47 positions shown; findings below may reference images not displayed]

FINDINGS: Osseous: No fracture or mandibular dislocation. No destructive
process.

Orbits: No intraorbital traumatic or inflammatory finding.

Sinuses: Mild sphenoid and maxillary sinus mucosal thickening.

Soft tissues: Centered within the soft tissues of the left
nasolabial fold deep to dermal irregularities in the left aspect of
the nose there is soft tissue thickening and induration (series 2,
image 43). Surrounding this area extending superiorly into
periorbital soft tissues, laterally over the malar subcutaneous fat,
inferiorly into the left superior lip there is fat stranding and
dermal thickening compatible with inflammatory change. No discrete
low-attenuation fluid collection is identified.

Limited intracranial: No significant or unexpected finding. Calcific
atherosclerosis of carotid siphons.
IMPRESSION: Inflammatory changes centered in the soft tissues of the left nasal
ala and left nasal labial fold extending superiorly to left
periorbital soft tissues, laterally over the cheek, and inferiorly
into the left upper lip probably representing cellulitis. No
discrete low-attenuation fluid collection is identified. No
infiltration of the intraorbital space or deep cervical spaces. No
osseous erosion.

By: Emiley Mistry M.D.

## 2017-02-25 ENCOUNTER — Encounter (HOSPITAL_COMMUNITY)
Admission: RE | Admit: 2017-02-25 | Discharge: 2017-02-25 | Disposition: A | Discharge status: Home / Self Care | Payer: Medicare Other | Source: Ambulatory Visit | Attending: Nephrology | Admitting: Nephrology

## 2017-02-25 VITALS — BP 131/66 | HR 67 | Resp 20

## 2017-02-25 DIAGNOSIS — N183 Chronic kidney disease, stage 3 unspecified: Secondary | ICD-10-CM

## 2017-02-25 DIAGNOSIS — D631 Anemia in chronic kidney disease: Secondary | ICD-10-CM | POA: Diagnosis not present

## 2017-02-25 LAB — POCT HEMOGLOBIN-HEMACUE: Hemoglobin: 10.7 g/dL — ABNORMAL LOW (ref 12.0–15.0)

## 2017-02-25 MED ORDER — EPOETIN ALFA 40000 UNIT/ML IJ SOLN: 30000.0000 [IU] | INTRAMUSCULAR | Status: DC

## 2017-02-25 MED ORDER — EPOETIN ALFA 10000 UNIT/ML IJ SOLN
INTRAMUSCULAR | Status: AC
  Administered 2017-02-25: 10:00:00 10000 [IU]
  Filled 2017-02-25: qty 1

## 2017-02-25 MED ORDER — EPOETIN ALFA 20000 UNIT/ML IJ SOLN
INTRAMUSCULAR | Status: AC
  Administered 2017-02-25: 10:00:00 20000 [IU]
  Filled 2017-02-25: qty 1

## 2017-03-07 ENCOUNTER — Ambulatory Visit (INDEPENDENT_AMBULATORY_CARE_PROVIDER_SITE_OTHER): Payer: Medicare Other | Admitting: Adult Health

## 2017-03-07 ENCOUNTER — Encounter: Payer: Self-pay | Admitting: Adult Health

## 2017-03-07 VITALS — BP 138/80 | HR 62 | Ht 64.5 in | Wt 259.8 lb

## 2017-03-07 DIAGNOSIS — G4733 Obstructive sleep apnea (adult) (pediatric): Secondary | ICD-10-CM

## 2017-03-07 DIAGNOSIS — J9611 Chronic respiratory failure with hypoxia: Secondary | ICD-10-CM

## 2017-03-07 DIAGNOSIS — J449 Chronic obstructive pulmonary disease, unspecified: Secondary | ICD-10-CM

## 2017-03-07 MED ORDER — MOMETASONE FURO-FORMOTEROL FUM 100-5 MCG/ACT IN AERO: 2.0000 | INHALATION_SPRAY | Freq: Two times a day (BID) | RESPIRATORY_TRACT | 5 refills | Status: DC

## 2017-03-07 NOTE — Progress Notes (Signed)
Chart and office note reviewed in detail  > agree with a/p as outlined    

## 2017-03-07 NOTE — Assessment & Plan Note (Signed)
Patient Instructions  Restart CPAP At bedtime  . Try to wear all night . CPAP download in 1 month.   Continue on current regimen  Follow med calendar closely and bring to each visit.  Do not drive if sleepy.  Work on weight loss.  Follow up Dr. Melvyn Novas  In 3 months and As needed

## 2017-03-07 NOTE — Assessment & Plan Note (Addendum)
Recent flare now resolving  Patient's medications were reviewed today and patient education was given. Computerized medication calendar was adjusted/completed    Plan  . Patient Instructions  Restart CPAP At bedtime  . Try to wear all night . CPAP download in 1 month.   Continue on current regimen  Follow med calendar closely and bring to each visit.  Do not drive if sleepy.  Work on weight loss.  Follow up Dr. Melvyn Novas  In 3 months and As needed

## 2017-03-07 NOTE — Patient Instructions (Signed)
Restart CPAP At bedtime  . Try to wear all night . CPAP download in 1 month.   Continue on current regimen  Follow med calendar closely and bring to each visit.  Do not drive if sleepy.  Work on weight loss.  Follow up Dr. Melvyn Novas  In 3 months and As needed

## 2017-03-07 NOTE — Progress Notes (Signed)
@Patient  ID: Joann Gutierrez, female    DOB: Aug 30, 1945, 71 y.o.   MRN: [id number]  Chief Complaint  Patient presents with  . Follow-up    Asthma     Referring provider: Seward Carol, MD  HPI: 24 yobf remote smoker - quit 03/1980 with  MO, asthma, OSA, OHS, and rt hemidiaphragm elevation first noted November 2011.     TESTS: PSG 04/28/09 >> AHI 11, SpO2 low 68% Spirometry 07/05/10 >> FEV1 1.30 (64%), FEV1% 80 >> coughing during test Echo 05/21/11 >> EF 55 to 60%, mild LVH, mild/mod TR, PAS 45 mmHg ONO with CPAP and room air 10/22/12 >> Test time 10 hrs 59 min. Mean SpO2 92.4%, low SpO2 75%. Spent 16 min with SpO2 < 89%. RAST 12/11/12 >> negative, IgE 63.3 Echo 02/04/13 >> mod LVH, EF 65 to 67%, grade 1 diastolic dysfx, PAS 42 mmHg Lt, Rt Heart cath 03/03/13 >> RA 10, RV 38/7, PA 41/16 28, PCWP 13, LV 142/5, AO 137/76 PFT 03/17/13 >> FEV1 1.27 (67%), FEV1% 84, TLC 4.06 (80%), DLCO 57%, borderline BD response - Spirometry 02/06/2017  FEV1 0.88 (49%)  Ratio 63 with f/v loop non-physiologic but min curvature in effort indep portion     03/07/2017 Follow up : Severe Asthma , OSA  Patient presents for a one-month follow-up.  Patient has known severe chronic obstructive asthma. Last visit patient had an asthma exacerbation.  She was treated with a prednisone taper. Since last visit patient is feeling some better. Still has intermittent cough and congestion .  She remains on Dulera 2 puffs twice daily.  She has allergic rhinitis on Flonase and Singulair. We reviewed all her medications organize them into a medication calendar with patient education. Appears that she is taking her medications correctly. Did leave a few meds at home , says they are on table. Has a friend that is her caregiver.     Allergies  Allergen Reactions  . Morphine And Related Other (See Comments)    Family request not to be given, reports pt does not wake up when given   . Promethazine Hcl Other (See  Comments)    REACTION: lethargy    Immunization History  Administered Date(s) Administered  . Influenza Split 10/16/2010, 11/10/2012  . Influenza Whole 01/02/2009, 11/02/2009, 10/31/2011  . Influenza,inj,Quad PF,6+ Mos 09/24/2013, 10/16/2015  . Pneumococcal Conjugate-13 10/16/2015  . Pneumococcal Polysaccharide-23 10/22/2002, 10/21/2009, 09/12/2013  . Td 08/22/2002    Past Medical History:  Diagnosis Date  . Adenomatous colon polyp   . Allergy   . Anemia   . Asthma       . CAD (coronary artery disease)    Mild very minimal coronary disease with 20% obtuse marginal stenosis  . Carpal tunnel syndrome on left   . CHF (congestive heart failure) ([place])   . Chronic kidney disease (CKD), stage III (moderate) ([facility])   . COPD (chronic obstructive pulmonary disease) ([place])   . CVA (cerebral infarction)   . Depression   . 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 Diverticulosis   . 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 Esophageal dysmotility   . Esophageal stricture   . Gastritis   . Gastroparesis 08/21/2007  . GERD (gastroesophageal reflux disease)   . Hair loss   . Hearing loss of both ears   . Hernia, hiatal   . Hyperlipidemia   . Hypertension   . Morbid obesity ([place])   . Obesity   .  Osteoarthritis   . OSTEOARTHRITIS 08/09/2006  . Osteoporosis   . 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 RENAL INSUFFICIENCY 02/16/2009  . 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 Seizures ([place])    pt thinks it has been several monthes since she had a seisure  . Shingles   . Sickle cell trait ([place])   . Stroke ([place])   . Tubular adenoma of colon     Tobacco History: Social History   Tobacco Use  Smoking Status Former Smoker  . Packs/day: 0.50  . Years: 10.00  . Pack years: 5.00  . Last attempt to quit: 04/18/1980  . Years since quitting: 36.9  Smokeless Tobacco Never Used   Counseling given: Not Answered   Outpatient Encounter Medications as of 03/07/2017  Medication  Sig  . aspirin EC 81 MG tablet Take 81 mg by mouth daily.  Marland Kitchen atorvastatin (LIPITOR) 40 MG tablet Take 40 mg by mouth daily.  . bacitracin ointment Apply topically 2 (two) times daily. Apply to the rash until resolved  . Calcium Carbonate-Vit D-Min (CALTRATE PLUS PO) Take 600 mg by mouth every morning.   . donepezil (ARICEPT) 10 MG tablet Take 10 mg by mouth at bedtime.   Marland Kitchen doxazosin (CARDURA) 2 MG tablet Take 4 mg by mouth at bedtime.   Marland Kitchen Epoetin Alfa (PROCRIT IJ) Inject 1 each as directed once a week.  . ezetimibe (ZETIA) 10 MG tablet Take 10 mg by mouth at bedtime.   . fluticasone (FLONASE) 50 MCG/ACT nasal spray PLACE TWO SPRAYS INTO BOTH NOSTRILS DAILY  . furosemide (LASIX) 80 MG tablet Take 1 tablet (80 mg total) by mouth daily.  Marland Kitchen gabapentin (NEURONTIN) 300 MG capsule Take 1 capsule (300 mg total) by mouth 2 (two) times daily.  Marland Kitchen guaiFENesin (MUCINEX) 600 MG 12 hr tablet Take 2 tablets (1,200 mg total) by mouth 2 (two) times daily as needed for cough or to loosen phlegm.  . hydrALAZINE (APRESOLINE) 100 MG tablet Take 100 mg by mouth 3 (three) times daily.  . isosorbide mononitrate (IMDUR) 120 MG 24 hr tablet Take 1 tablet (120 mg total) by mouth daily. PATIENT NEEDS TO CONTACT OFFICE FOR ADDITIONAL REFILLS  . ketorolac (ACULAR) 0.5 % ophthalmic solution PLACE ONE DROP INTO THE LEFT EYE FOUR TIMES A DAY  . KLOR-CON M20 20 MEQ tablet Take 20 mEq by mouth daily.   Marland Kitchen LEVEMIR FLEXTOUCH 100 UNIT/ML Pen Inject 15 Units as directed every morning.  . mometasone-formoterol (DULERA) 100-5 MCG/ACT AERO Inhale 2 puffs into the lungs 2 (two) times daily.  . montelukast (SINGULAIR) 10 MG tablet TAKE ONE TABLET BY MOUTH EVERY DAY.  . Multiple Vitamins-Minerals (MULTIVITAMIN GUMMIES ADULT) CHEW Chew 1 capsule by mouth daily.  . nepafenac (ILEVRO) 0.3 % ophthalmic suspension Place 1 drop into the left eye daily.  . nitroGLYCERIN (NITROSTAT) 0.4 MG SL tablet PLACE ONE TABLET UNDER THE TONGUE EVERY FIVE  MINUTES AS NEEDED FOR CHEST PAIN  . omeprazole (PRILOSEC) 20 MG capsule Take 1 capsule (20 mg total) by mouth 2 (two) times daily before a meal.  . omeprazole (PRILOSEC) 40 MG capsule TAKE ONE CAPSULE BY MOUTH DAILY  . ondansetron (ZOFRAN ODT) 4 MG disintegrating tablet Take 1 tablet (4 mg total) by mouth every 4 (four) hours as needed for nausea or vomiting.  . OXYGEN Inhale into the lungs. CPAP with oxygen at bedtime  . polyvinyl alcohol (LIQUIFILM TEARS) 1.4 % ophthalmic solution Place 1 drop into both eyes as needed for dry eyes.  Marland Kitchen  prednisoLONE acetate (PRED MILD) 0.12 % ophthalmic suspension Place 1 drop into the left eye 4 (four) times daily.  . predniSONE (DELTASONE) 10 MG tablet Take 4 for three days 3 for three days 2 for three days 1 for three days and stop  . raloxifene (EVISTA) 60 MG tablet Take 60 mg by mouth every morning.   . valACYclovir (VALTREX) 500 MG tablet Take 1 tablet (500 mg total) by mouth 3 (three) times daily.  . VENTOLIN HFA 108 (90 Base) MCG/ACT inhaler INHALE 2 PUFFS INTO THE LUNGS EVERY SIX HOURS AS NEEDED FOR SHORTNESSOF BREATH  . Vitamin D, Ergocalciferol, (DRISDOL) 50000 UNITS CAPS capsule Take 50,000 Units by mouth every Monday.    Facility-Administered Encounter Medications as of 03/07/2017  Medication  . triamcinolone acetonide (TRIESENCE) 40 MG/ML subtenons injection 20 mg     Review of Systems  Constitutional:   No  weight loss, night sweats,  Fevers, chills, + fatigue, or  lassitude.  HEENT:   No headaches,  Difficulty swallowing,  Tooth/dental problems, or  Sore throat,                No sneezing, itching, ear ache, nasal congestion, post nasal drip,   CV:  No chest pain,  Orthopnea, PND, swelling in lower extremities, anasarca, dizziness, palpitations, syncope.   GI  No heartburn, indigestion, abdominal pain, nausea, vomiting, diarrhea, change in bowel habits, loss of appetite, bloody stools.   Resp:   No chest wall deformity  Skin: no rash  or lesions.  GU: no dysuria, change in color of urine, no urgency or frequency.  No flank pain, no hematuria   MS:  No joint pain or swelling.  No decreased range of motion.  No back pain.    Physical Exam  BP 138/80 (BP Location: Right Arm, Cuff Size: Normal)   Pulse 62   Ht 5' 4.5" (1.638 m)   Wt 259 lb 12.8 oz (117.8 kg)   SpO2 94%   BMI 43.91 kg/m    GEN: A/Ox3; pleasant , NAD, obese    HEENT:  [place]/AT,  EACs-clear, TMs-wnl, NOSE-clear, THROAT-clear, no lesions, no postnasal drip or exudate noted. Class 3 MP airway   NECK:  Supple w/ fair ROM; no JVD; normal carotid impulses w/o bruits; no thyromegaly or nodules palpated; no lymphadenopathy.    RESP  Clear  P & A; w/o, wheezes/ rales/ or rhonchi. no accessory muscle use, no dullness to percussion  CARD:  RRR, no m/r/g, tr  peripheral edema, pulses intact, no cyanosis or clubbing.  GI:   Soft & nt; nml bowel sounds; no organomegaly or masses detected.   Musco: Warm bil, no deformities or joint swelling noted.   Neuro: alert, no focal deficits noted.    Skin: Warm, no lesions or rashes    Lab Results:  CBC    Component Value Date/Time   WBC 7.7 12/14/2015 1325   RBC 4.95 12/14/2015 1325   HGB 10.7 (L) 02/25/2017 1022   HCT 36.5 12/14/2015 1325   PLT 381 12/14/2015 1325   MCV 73.7 (L) 12/14/2015 1325   MCH 23.4 (L) 12/14/2015 1325   MCHC 31.8 12/14/2015 1325   RDW 16.4 (H) 12/14/2015 1325   LYMPHSABS 1.3 12/14/2015 1325   MONOABS 1.0 12/14/2015 1325   EOSABS 0.0 12/14/2015 1325   BASOSABS 0.0 12/14/2015 1325    BMET    Component Value Date/Time   NA 142 02/11/2017 1044   K 3.4 (L) 02/11/2017 1044  CL 100 (L) 02/11/2017 1044   CO2 28 02/11/2017 1044   GLUCOSE 176 (H) 02/11/2017 1044   BUN 31 (H) 02/11/2017 1044   CREATININE 1.42 (H) 02/11/2017 1044   CREATININE 1.36 (H) 07/24/2011 0925   CALCIUM 9.2 02/11/2017 1044   CALCIUM 9.2 02/11/2017 1043   GFRNONAA 36 (L) 02/11/2017 1044   GFRAA 42 (L)  02/11/2017 1044    BNP    Component Value Date/Time   BNP 119.4 (H) 05/19/2015 0048    ProBNP    Component Value Date/Time   PROBNP 65.0 05/20/2014 1620    Imaging: No results found.   Assessment & Plan:   No problem-specific Assessment & Plan notes found for this encounter.     Rexene Edison, NP 03/07/2017

## 2017-03-07 NOTE — Assessment & Plan Note (Signed)
Restart CPAP w/ O2 At bedtime

## 2017-03-07 NOTE — Addendum Note (Signed)
Addended by: Jannette Spanner on: 03/07/2017 05:19 PM   Modules accepted: Orders

## 2017-03-09 IMAGING — CT CT ABD-PELV W/ CM
2 of 5 series · 15 of 46 positions shown, 17 images · IV contrast (ISOVUE)
Comparison: 09/11/2013, 01/30/2009.

CLINICAL DATA: 70-year-old with acute onset of epigastric abdominal
pain, nausea and vomiting that began yesterday.

EXAM:
CT ABDOMEN AND PELVIS WITH CONTRAST
TECHNIQUE: Multidetector CT imaging of the abdomen and pelvis was performed
using the standard protocol following bolus administration of
intravenous contrast.
CONTRAST:  80 mL IOPAMIDOL ([id number]-[id number]) INJECTION 61% IV. Oral
contrast was also administered.

[Series 2: abd/pel with · axial · 0.74mm/px · z∈[-759,-389]mm · 12 of 88 slices shown, 14 images]
[im 7/88  soft-tissue]
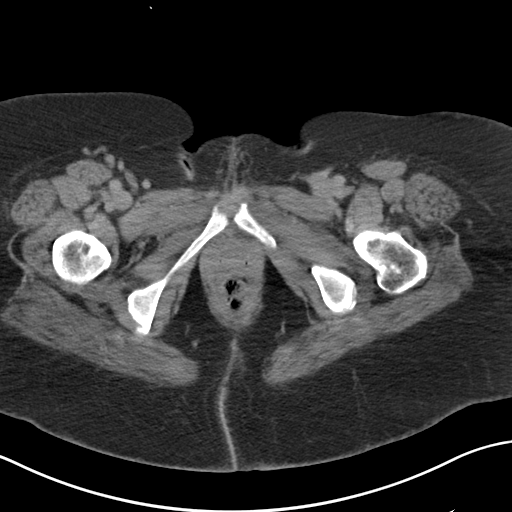
[im 7/88  bone]
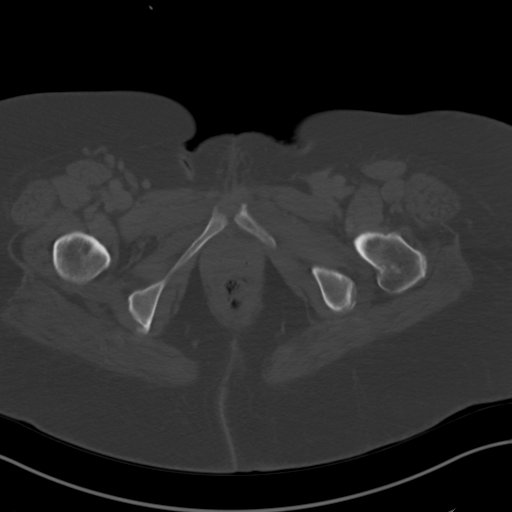
[im 14/88  soft-tissue]
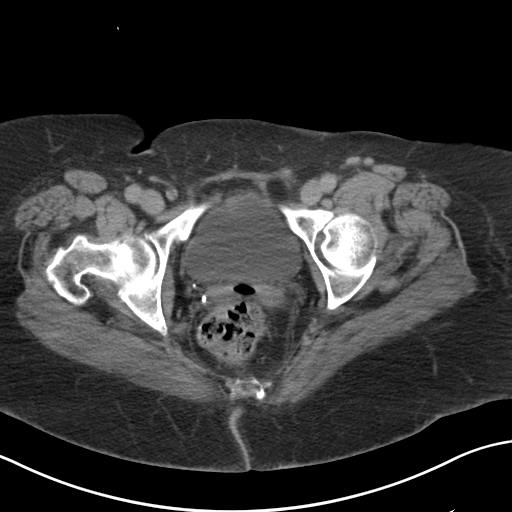
[im 21/88  soft-tissue]
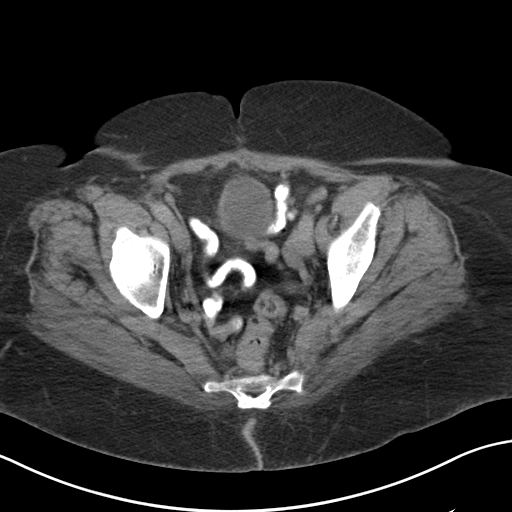
[im 27/88  soft-tissue]
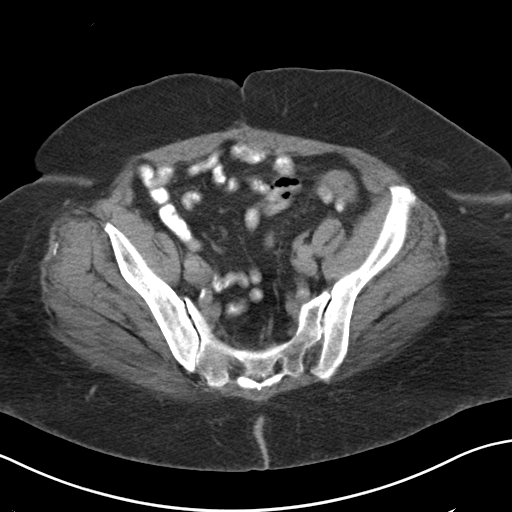
[im 34/88  soft-tissue]
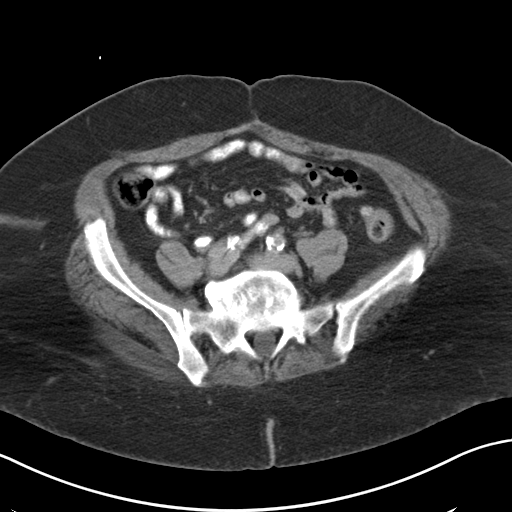
[im 41/88  soft-tissue]
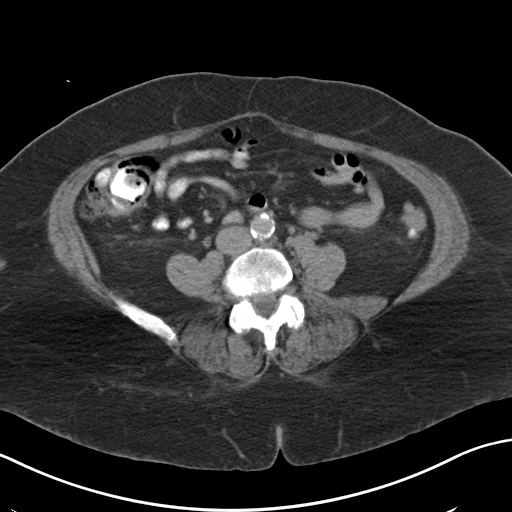
[im 47/88  soft-tissue]
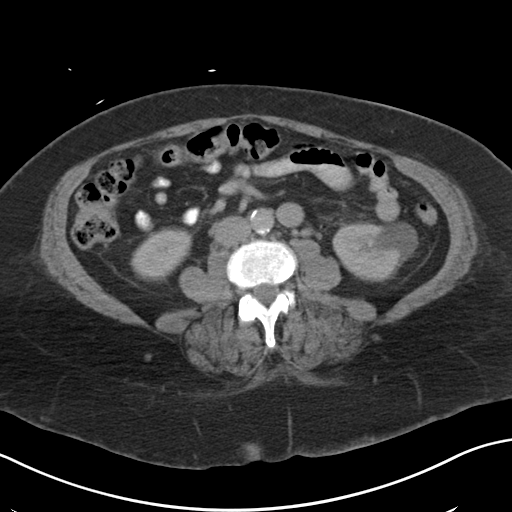
[im 54/88  soft-tissue]
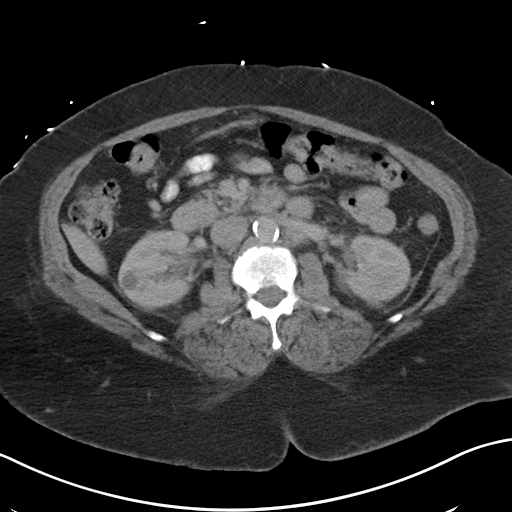
[im 61/88  soft-tissue]
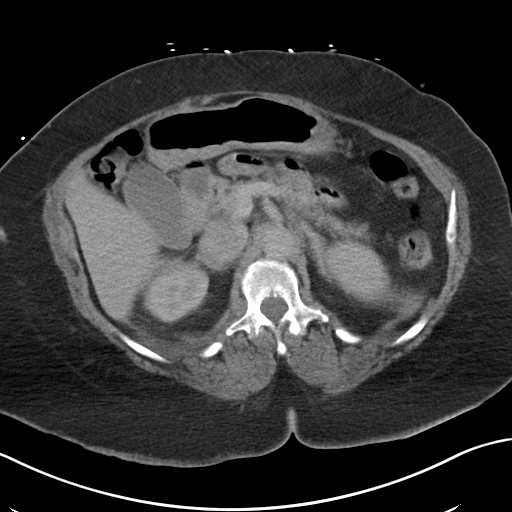
[im 61/88  bone]
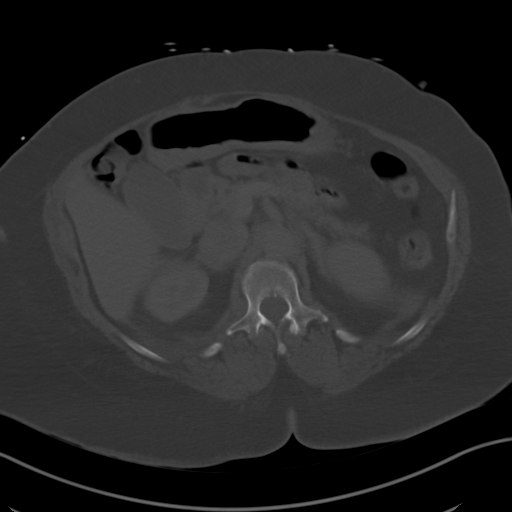
[im 67/88  soft-tissue]
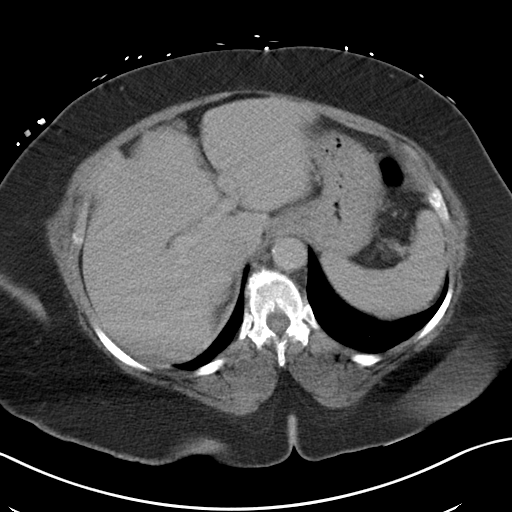
[im 74/88  soft-tissue]
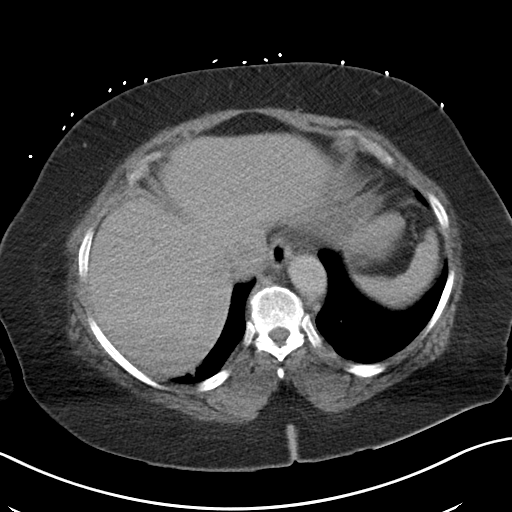
[im 81/88  soft-tissue]
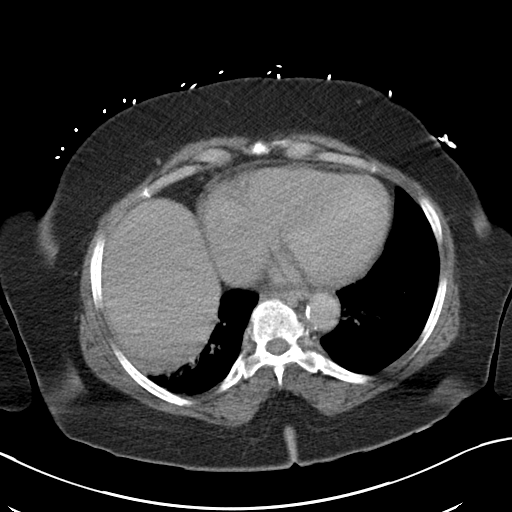

[Series 3: coronal a/|p · coronal · 0.92mm/px · 3 of 126 slices shown]
[im 42/126  soft-tissue]
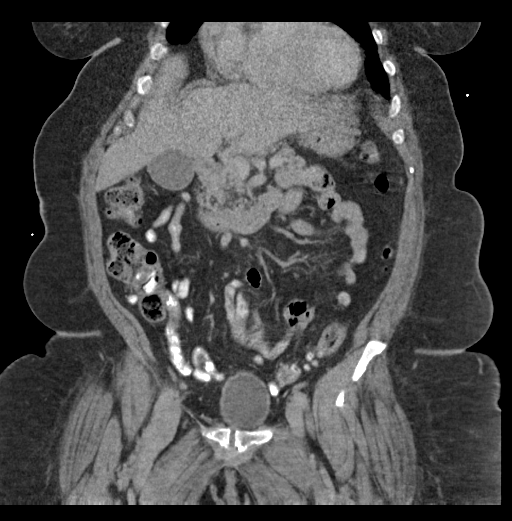
[im 56/126  soft-tissue]
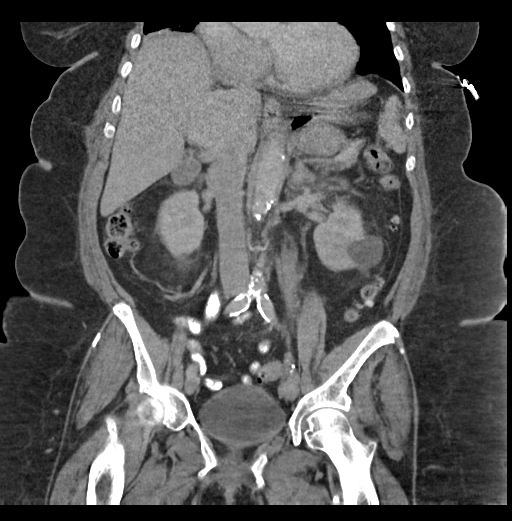
[im 70/126  soft-tissue]
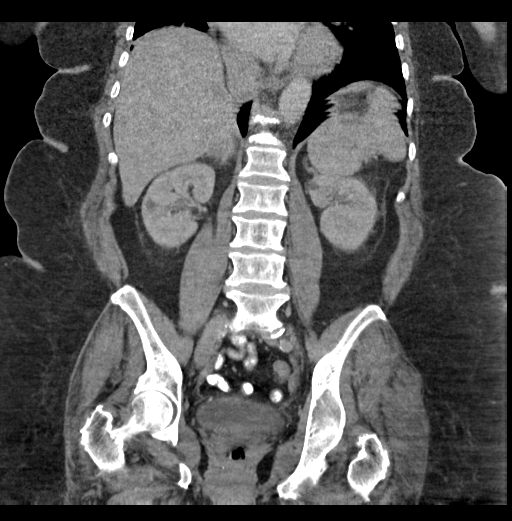

[15 of 46 positions shown; findings below may reference images not displayed]

FINDINGS: Lower chest: Scar/bronchiectasis involving the posterior right lower
lobe at the site of a prior pneumonia. Visualized lung bases
otherwise clear. Heart moderately enlarged, unchanged.

Hepatobiliary: Liver normal in size and appearance. Gallbladder
normal in appearance without calcified gallstones. No biliary ductal
dilation. Elevation the right hemidiaphragm is present accounting
for the unusual shape of the liver.

Pancreas: Mildly atrophic without evidence of mass or inflammation.

Spleen: Normal in size and appearance.

Adrenals/Urinary Tract: Normal appearing adrenal glands. Simple
cysts involving both kidneys, the largest arising from the lower
pole of the left kidney approximately 3.6 cm. No significant focal
abnormality involving either kidney. No urinary tract calculi or
obstruction. Mild bilateral perinephric edema/stranding, unchanged.
Normal appearing decompressed urinary bladder.

Stomach/Bowel: Stomach decompressed and normal in appearance. Normal
appearing small bowel. Diffuse colonic diverticulosis, most
extensive in the sigmoid region, without evidence of acute
diverticulitis. Appendix not visualized but no pericecal
inflammation.

Vascular/Lymphatic: Severe aortoiliac atherosclerosis and mild left
common femoral artery atherosclerosis without aneurysm. Visceral
arteries patent. Normal-appearing portal venous and systemic venous
systems.

No pathologic lymphadenopathy.

Reproductive: Surgically absent uterus. No adnexal masses or free
pelvic fluid.

Other: Very small umbilical hernia containing fat.

Musculoskeletal: Osseous demineralization. Degenerative disc disease
and spondylosis at L5-S1. Lower thoracic degenerative disc disease
and spondylosis. Facet degenerative changes from L2-3 through L5-S1.
IMPRESSION: 1. No acute abnormalities involving the abdomen or pelvis.
2. Diffuse colonic diverticulosis without evidence of acute
diverticulitis.
3. Generalized atherosclerosis. No evidence of abdominal aortic
aneurysm.
4. Mild pancreatic atrophy.
5. Scar/bronchiectasis involving the posterior right lower lobe at
the site of a prior pneumonia.

## 2017-03-09 IMAGING — CT CT HEAD W/O CM
3 of 4 series · 15 of 47 positions shown, 18 images · non-contrast
Comparison: 05/20/2015 CT and prior exams

CLINICAL DATA: 70-year-old female with confusion and altered mental
status.

EXAM:
CT HEAD WITHOUT CONTRAST
TECHNIQUE: Contiguous axial images were obtained from the base of the skull
through the vertex without intravenous contrast.

[Series 2: head w/o · axial · non-contrast · 0.45mm/px · z∈[-112,+8]mm · 9 of 29 slices shown, 12 images]
[im 3/29  brain]
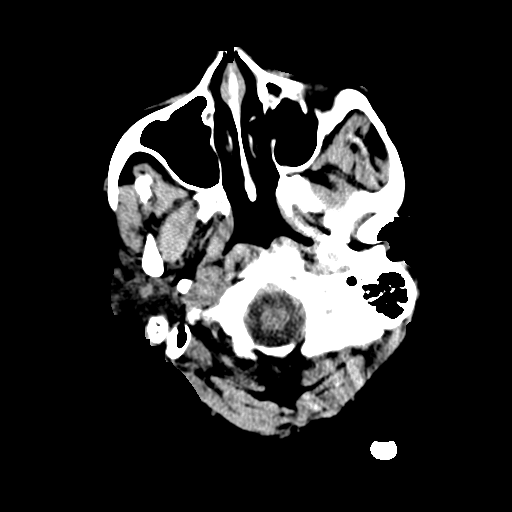
[im 3/29  bone]
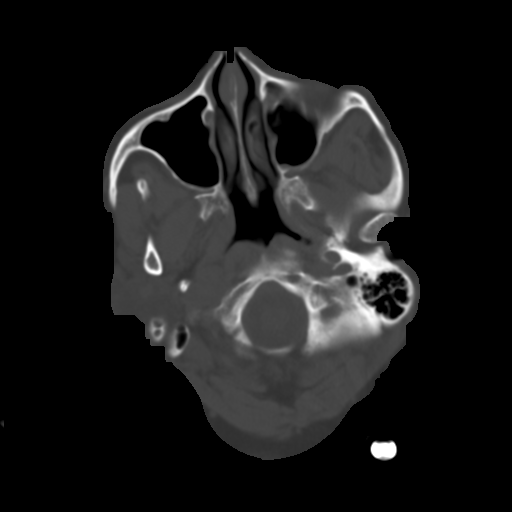
[im 7/29  brain]
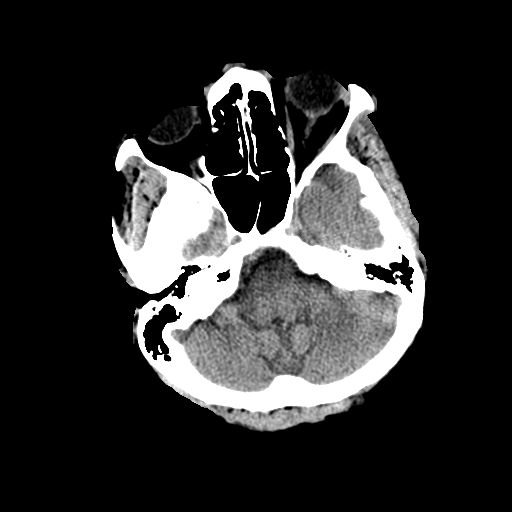
[im 9/29  brain]
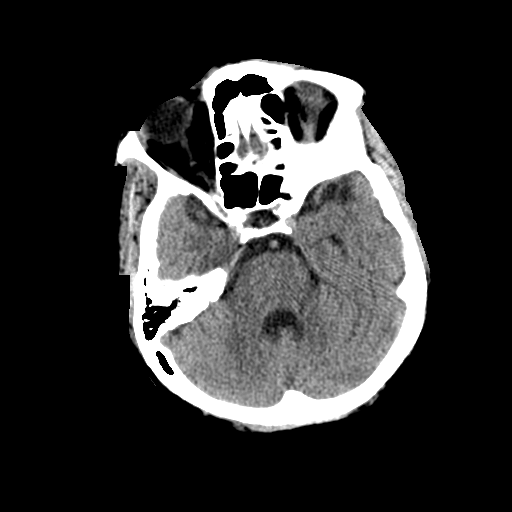
[im 13/29  brain]
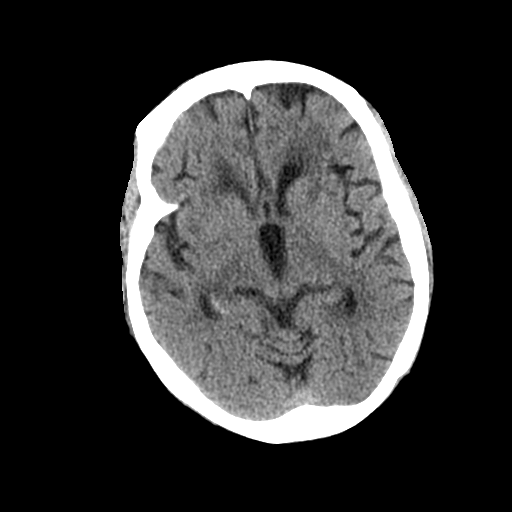
[im 15/29  brain]
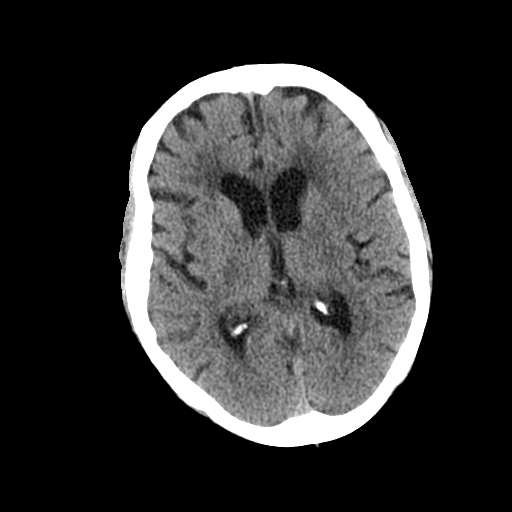
[im 15/29  bone]
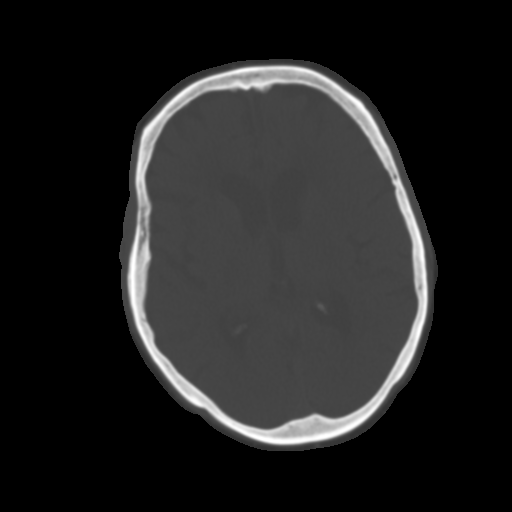
[im 17/29  brain]
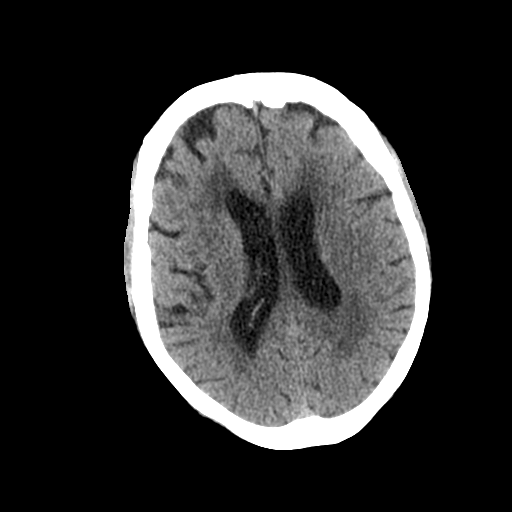
[im 21/29  brain]
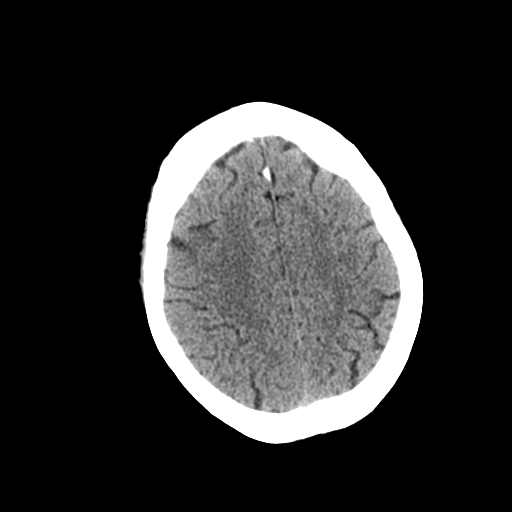
[im 23/29  brain]
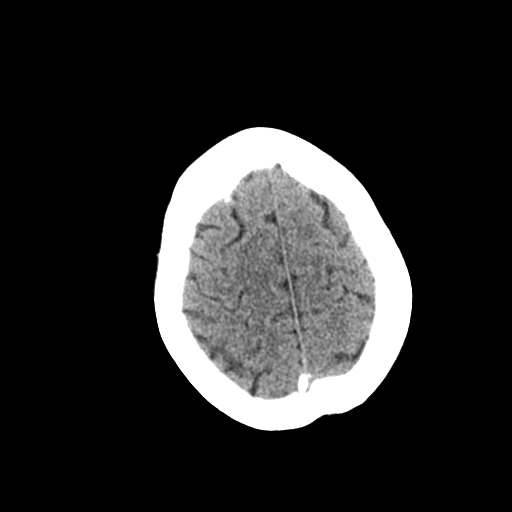
[im 27/29  brain]
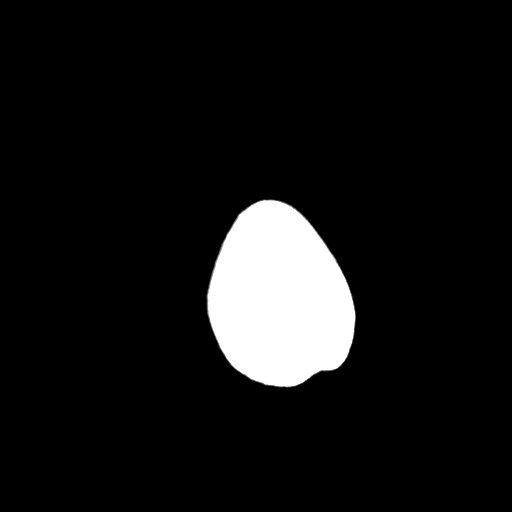
[im 27/29  bone]
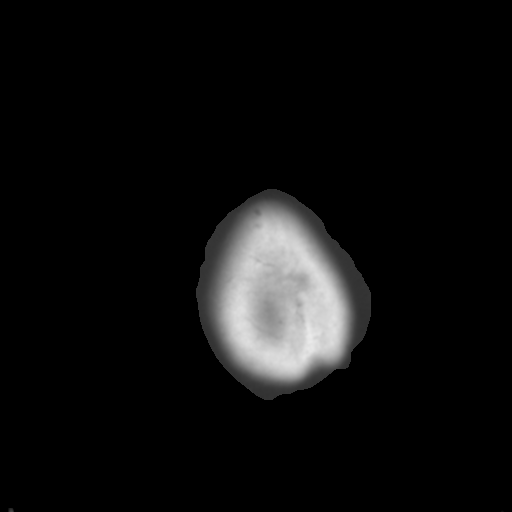

[Series 4: coronal · coronal · 0.34mm/px · 3 of 62 slices shown]
[im 21/62  brain]
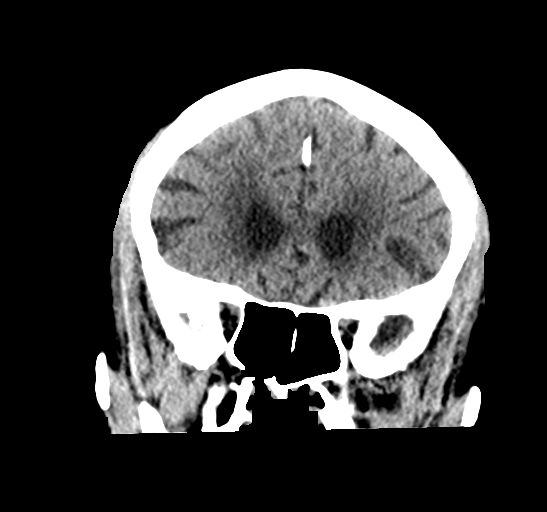
[im 28/62  brain]
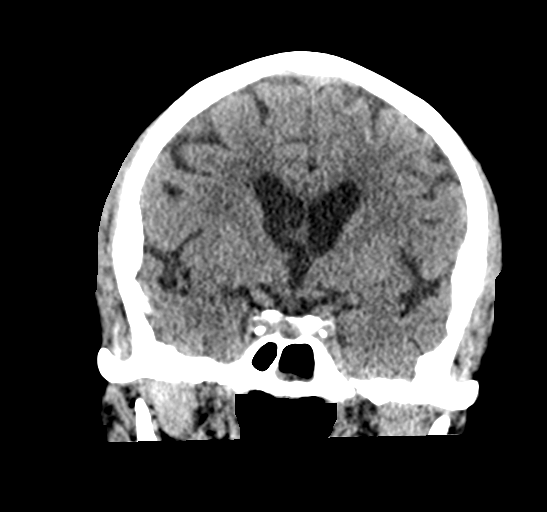
[im 34/62  brain]
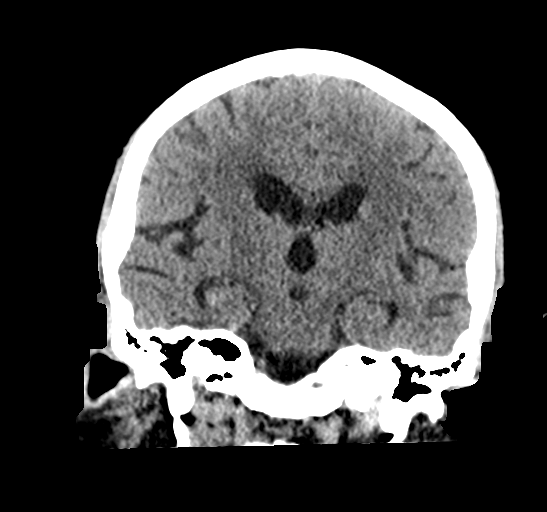

[Series 5: sagittal · sagittal · 0.34mm/px · 3 of 50 slices shown]
[im 17/50  brain]
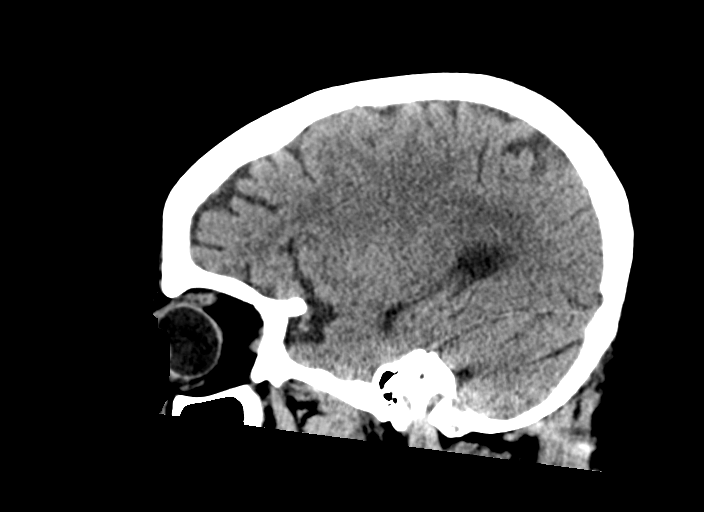
[im 25/50  brain]
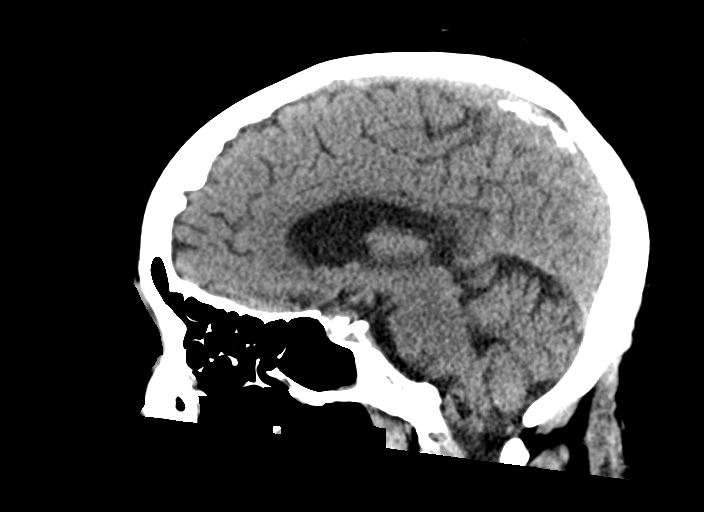
[im 33/50  brain]
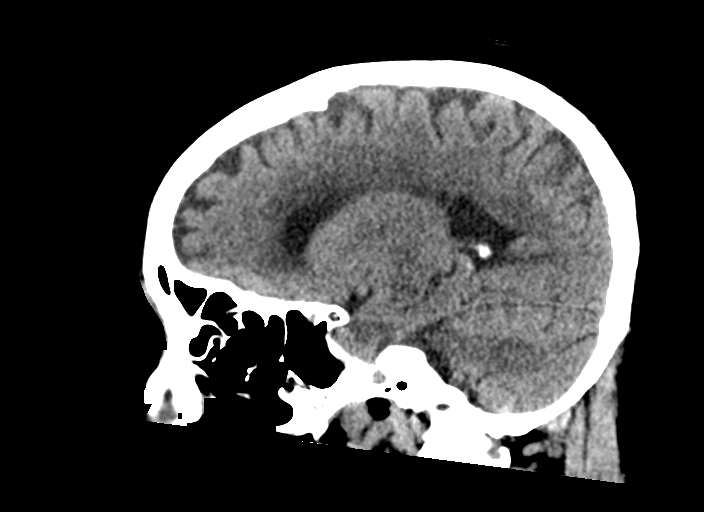

[15 of 47 positions shown; findings below may reference images not displayed]

FINDINGS: Brain: No evidence of acute infarction, hemorrhage, hydrocephalus,
extra-axial collection or mass lesion/mass effect. Atrophy and
chronic small-vessel white matter ischemic changes are again noted.

Vascular: Mild intracranial atherosclerotic vascular calcifications
are present. No hyperdense vessels are noted.

Skull: Normal. Negative for fracture or focal lesion.

Sinuses/Orbits: No acute finding.

Other: None.
IMPRESSION: No evidence of acute intracranial abnormality.

Atrophy and chronic small-vessel white matter ischemic changes.

## 2017-03-11 ENCOUNTER — Encounter (HOSPITAL_COMMUNITY)
Admission: RE | Admit: 2017-03-11 | Discharge: 2017-03-11 | Disposition: A | Discharge status: Home / Self Care | Payer: Medicare Other | Source: Ambulatory Visit | Attending: Nephrology | Admitting: Nephrology

## 2017-03-11 VITALS — BP 138/75 | HR 85

## 2017-03-11 DIAGNOSIS — D631 Anemia in chronic kidney disease: Secondary | ICD-10-CM | POA: Diagnosis not present

## 2017-03-11 DIAGNOSIS — N183 Chronic kidney disease, stage 3 unspecified: Secondary | ICD-10-CM

## 2017-03-11 LAB — RENAL FUNCTION PANEL
Albumin: 3.1 g/dL — ABNORMAL LOW (ref 3.5–5.0)
Anion gap: 13 (ref 5–15)
BUN: 25 mg/dL — AB (ref 6–20)
CALCIUM: 8.9 mg/dL (ref 8.9–10.3)
CO2: 25 mmol/L (ref 22–32)
CREATININE: 1.64 mg/dL — AB (ref 0.44–1.00)
Chloride: 103 mmol/L (ref 101–111)
GFR, EST AFRICAN AMERICAN: 35 mL/min — AB (ref >60)
GFR, EST NON AFRICAN AMERICAN: 30 mL/min — AB (ref >60)
Glucose, Bld: 325 mg/dL — ABNORMAL HIGH (ref 65–99)
Phosphorus: 3 mg/dL (ref 2.5–4.6)
Potassium: 4.1 mmol/L (ref 3.5–5.1)
SODIUM: 141 mmol/L (ref 135–145)

## 2017-03-11 LAB — IRON AND TIBC
Iron: 151 ug/dL (ref 28–170)
Saturation Ratios: 71 % — ABNORMAL HIGH (ref 10.4–31.8)
TIBC: 213 ug/dL — ABNORMAL LOW (ref 250–450)
UIBC: 62 ug/dL

## 2017-03-11 LAB — POCT HEMOGLOBIN-HEMACUE: HEMOGLOBIN: 11.1 g/dL — AB (ref 12.0–15.0)

## 2017-03-11 LAB — FERRITIN: FERRITIN: 523 ng/mL — AB (ref 11–307)

## 2017-03-11 MED ORDER — EPOETIN ALFA 40000 UNIT/ML IJ SOLN: 30000.0000 [IU] | INTRAMUSCULAR | Status: DC

## 2017-03-11 MED ORDER — EPOETIN ALFA 10000 UNIT/ML IJ SOLN
INTRAMUSCULAR | Status: AC
  Administered 2017-03-11: 11:00:00 10000 [IU]
  Filled 2017-03-11: qty 1

## 2017-03-11 MED ORDER — EPOETIN ALFA 20000 UNIT/ML IJ SOLN
INTRAMUSCULAR | Status: AC
  Administered 2017-03-11: 11:00:00 20000 [IU]
  Filled 2017-03-11: qty 1

## 2017-03-12 ENCOUNTER — Telehealth: Payer: Self-pay | Admitting: Adult Health

## 2017-03-12 DIAGNOSIS — N390 Urinary tract infection, site not specified: Secondary | ICD-10-CM | POA: Diagnosis not present

## 2017-03-12 DIAGNOSIS — Z1231 Encounter for screening mammogram for malignant neoplasm of breast: Secondary | ICD-10-CM | POA: Diagnosis not present

## 2017-03-12 DIAGNOSIS — Z6841 Body Mass Index (BMI) 40.0 and over, adult: Secondary | ICD-10-CM | POA: Diagnosis not present

## 2017-03-12 DIAGNOSIS — Z01419 Encounter for gynecological examination (general) (routine) without abnormal findings: Secondary | ICD-10-CM | POA: Diagnosis not present

## 2017-03-12 DIAGNOSIS — M8588 Other specified disorders of bone density and structure, other site: Secondary | ICD-10-CM | POA: Diagnosis not present

## 2017-03-12 DIAGNOSIS — N958 Other specified menopausal and perimenopausal disorders: Secondary | ICD-10-CM | POA: Diagnosis not present

## 2017-03-12 LAB — PTH, INTACT AND CALCIUM
CALCIUM TOTAL (PTH): 8.8 mg/dL (ref 8.7–10.3)
PTH: 129 pg/mL — ABNORMAL HIGH (ref 15–65)

## 2017-03-12 MED ORDER — FLUTICASONE FUROATE-VILANTEROL 100-25 MCG/INH IN AEPB: 1.0000 | INHALATION_SPRAY | Freq: Every day | RESPIRATORY_TRACT | 5 refills | Status: DC

## 2017-03-12 NOTE — Telephone Encounter (Signed)
Patient seen 2.15.19 by TP for med calendar Refills on [facility] sent at [facility]  Received fax from [facility] that [place] is no longer covered by insurance Covered alternatives are: Advair 250-50 diskus, Advair HFA 115-21, Breo 100, Symbicort 160-4.55mcg  Per TP: okay to change to Breo 100 1 puff once daily.  Thanks.  Called spoke with patient to discuss the above.  Pt okay with changing to [facility].  She is aware of directions for use and to brush/rinse/gargle after use.  Pt is aware that the device is different from the [facility].  Note added to Rx for pharmacist to instruct patient on use and pt is aware to ensure this is done.    Med list updated Rx sent to verified pharmacy  Pt aware to contact the office if the [facility] does not manage her symptoms  Nothing further needed; will sign off

## 2017-03-14 NOTE — Progress Notes (Signed)
[facility] Note  03/19/2017     CHIEF COMPLAINT Patient presents for Retina Follow Up   HISTORY OF PRESENT ILLNESS: Joann Gutierrez is a 72 y.o. female who presents to the clinic today for:   HPI    Retina Follow Up    Patient presents with  Other.  In left eye.  This started 6 months ago.  Severity is mild.  I, the attending physician,  performed the HPI with the patient and updated documentation appropriately.          Comments    F/U CME /Focal chorioretinal inflammation/ posterior uveitis OS.Patient states "she continues to have occasional flashes of light, floaters and occasional  aching of the  left eye". Pt reports she has occasional floaters OD. Pt states "she can see better but not to read". Pt's BS 121 this am, A1C unsure of results. Pt is using Pred Forte Gtt's Tid, liquifilm-tears PRN. Pt is on Levirmir. Pt is taking multivitamins qd.       Last edited by Bernarda Caffey, MD on 03/19/2017 11:02 AM. (History)    Pt states she continues to have trouble reading  Referring physician: Hortencia Pilar, MD [place], [place] [id number]  HISTORICAL INFORMATION:   Selected notes from the MEDICAL RECORD NUMBER Referral from Dr. Read Drivers for r/o angiographic CME Ocular Hx - HZO OS; Pseudophakia OS (07/10/16); cellophane OS; HTN; PMH - DM II on insulin; Hx stroke;    CURRENT MEDICATIONS: Current Outpatient Medications (Ophthalmic Drugs)  Medication Sig  . ketorolac (ACULAR) 0.5 % ophthalmic solution PLACE ONE DROP INTO THE LEFT EYE FOUR TIMES A DAY   Current Facility-Administered Medications (Ophthalmic Drugs)  Medication Route  . triamcinolone acetonide (TRIESENCE) 40 MG/ML subtenons injection 20 mg Subtenons   Current Outpatient Medications (Other)  Medication Sig  . aspirin EC 81 MG tablet Take 81 mg by mouth daily.  Marland Kitchen atorvastatin (LIPITOR) 40 MG tablet Take 40 mg by mouth daily.  . carvedilol (COREG) 12.5 MG  tablet Take 12.5 mg by mouth 2 (two) times daily with a meal.  . doxazosin (CARDURA) 2 MG tablet Take 4 mg by mouth at bedtime.   Marland Kitchen ezetimibe (ZETIA) 10 MG tablet Take 10 mg by mouth at bedtime.   . fluticasone (FLONASE) 50 MCG/ACT nasal spray PLACE TWO SPRAYS INTO BOTH NOSTRILS DAILY  . furosemide (LASIX) 80 MG tablet Take 1 tablet (80 mg total) by mouth daily. (Patient taking differently: Take 80 mg by mouth 2 (two) times daily. )  . gabapentin (NEURONTIN) 300 MG capsule Take 1 capsule (300 mg total) by mouth 2 (two) times daily.  Marland Kitchen guaiFENesin (MUCINEX) 600 MG 12 hr tablet Take 2 tablets (1,200 mg total) by mouth 2 (two) times daily as needed for cough or to loosen phlegm.  . hydrALAZINE (APRESOLINE) 100 MG tablet Take 100 mg by mouth 3 (three) times daily.  . isosorbide mononitrate (IMDUR) 120 MG 24 hr tablet Take 1 tablet (120 mg total) by mouth daily. PATIENT NEEDS TO CONTACT OFFICE FOR ADDITIONAL REFILLS  . KLOR-CON M20 20 MEQ tablet Take 20 mEq by mouth daily.   Marland Kitchen LEVEMIR FLEXTOUCH 100 UNIT/ML Pen Inject 15 Units as directed every morning.  Marland Kitchen losartan (COZAAR) 100 MG tablet Take 100 mg by mouth daily.  . metolazone (ZAROXOLYN) 2.5 MG tablet Take 2.5 mg by mouth daily.  . montelukast (SINGULAIR) 10 MG tablet TAKE ONE TABLET BY MOUTH EVERY DAY.  Marland Kitchen  nitroGLYCERIN (NITROSTAT) 0.4 MG SL tablet PLACE ONE TABLET UNDER THE TONGUE EVERY FIVE MINUTES AS NEEDED FOR CHEST PAIN  . OXYGEN Inhale into the lungs. CPAP with oxygen at bedtime  . raloxifene (EVISTA) 60 MG tablet Take 60 mg by mouth every morning.   . sucralfate (CARAFATE) 1 g tablet Take 1 g by mouth 4 (four) times daily -  with meals and at bedtime.  . valACYclovir (VALTREX) 500 MG tablet Take 1 tablet (500 mg total) by mouth 3 (three) times daily.  . VENTOLIN HFA 108 (90 Base) MCG/ACT inhaler INHALE 2 PUFFS INTO THE LUNGS EVERY SIX HOURS AS NEEDED FOR SHORTNESSOF BREATH  . Vitamin D, Ergocalciferol, (DRISDOL) 50000 UNITS CAPS capsule Take  50,000 Units by mouth every Monday.   Marland Kitchen omeprazole (PRILOSEC) 20 MG capsule Take 1 capsule (20 mg total) by mouth 2 (two) times daily before a meal.   No current facility-administered medications for this visit.  (Other)      REVIEW OF SYSTEMS: ROS    Positive for: Genitourinary, Musculoskeletal, Endocrine, Cardiovascular, Eyes, Respiratory, Allergic/Imm, Heme/Lymph   Negative for: Constitutional, Gastrointestinal, Neurological, Skin, HENT, Psychiatric   Last edited by Zenovia Jordan, LPN on 7/49/4496 75:91 AM. (History)       ALLERGIES Allergies  Allergen Reactions  . Morphine And Related Other (See Comments)    Family request not to be given, reports pt does not wake up when given   . Promethazine Hcl Other (See Comments)    REACTION: lethargy    PAST MEDICAL HISTORY Past Medical History:  Diagnosis Date  . Adenomatous colon polyp   . Allergy   . Anemia   . Asthma       . CAD (coronary artery disease)    Mild very minimal coronary disease with 20% obtuse marginal stenosis  . Carpal tunnel syndrome on left   . CHF (congestive heart failure) ([place])   . Chronic kidney disease (CKD), stage III (moderate) (HCC)   . COPD (chronic obstructive pulmonary disease) ([place])   . CVA (cerebral infarction)   . Depression   . 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 Diverticulosis   . 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 Esophageal dysmotility   . Esophageal stricture   . Gastritis   . Gastroparesis 08/21/2007  . GERD (gastroesophageal reflux disease)   . Hair loss   . Hearing loss of both ears   . Hernia, hiatal   . Hyperlipidemia   . Hypertension   . Morbid obesity ([place])   . Obesity   . Osteoarthritis   . OSTEOARTHRITIS 08/09/2006  . Osteoporosis   . 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 RENAL INSUFFICIENCY 02/16/2009  . 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 Seizures ([place])    pt thinks it has been several monthes since she had a seisure  . Shingles    . Sickle cell trait ([place])   . Stroke ([place])   . Tubular adenoma of colon    Past Surgical History:  Procedure Laterality Date  . ABDOMINAL HYSTERECTOMY    . ARTERY BIOPSY  01/07/2011   Procedure: MINOR BIOPSY TEMPORAL ARTERY;  Surgeon: Haywood Lasso, MD;  Location: [facility];  Service: General;  Laterality: Left;  left temporal artery biopsy  . bil foot surgery    . BREAST LUMPECTOMY     benign  . BREAST LUMPECTOMY     both breast lumps removed   . CATARACT EXTRACTION Left 06/2016  Dr. Read Drivers  . COLONOSCOPY    . COLONOSCOPY WITH PROPOFOL N/A 07/05/2015   Procedure: COLONOSCOPY WITH PROPOFOL;  Surgeon: Manus Gunning, MD;  Location: [facility];  Service: Gastroenterology;  Laterality: N/A;  . ERD  08/08/2000  . ESOPHAGEAL MANOMETRY N/A 03/13/2015   Procedure: ESOPHAGEAL MANOMETRY (EM);  Surgeon: Manus Gunning, MD;  Location: [facility];  Service: Gastroenterology;  Laterality: N/A;  . ESOPHAGOGASTRODUODENOSCOPY  06/25/2006  . ESOPHAGOGASTRODUODENOSCOPY (EGD) WITH PROPOFOL N/A 07/05/2015   Procedure: ESOPHAGOGASTRODUODENOSCOPY (EGD) WITH PROPOFOL;  Surgeon: Manus Gunning, MD;  Location: [facility];  Service: Gastroenterology;  Laterality: N/A;  . HERNIA REPAIR    . LEFT AND RIGHT HEART CATHETERIZATION WITH CORONARY ANGIOGRAM N/A 03/03/2013   Procedure: LEFT AND RIGHT HEART CATHETERIZATION WITH CORONARY ANGIOGRAM;  Surgeon: Minus Breeding, MD;  Location: [facility] C[facility];  Service: Cardiovascular;  Laterality: N/A;  . REPLACEMENT TOTAL KNEE Left 1998  . UPPER GASTROINTESTINAL ENDOSCOPY      FAMILY HISTORY Family History  Problem Relation Age of Onset  . Liver cancer Mother        Liver Cancer  . Diabetes Mother   . Kidney disease Mother   . Heart disease Mother        age 10's  . Heart disease Father 74       MI  . Heart attack Father        died of MI when pt was 10  . Sickle cell anemia Father   . Colon cancer Brother   .  Cancer Brother        Colon Cancer  . Diabetes Sister   . Kidney disease Sister   . Heart disease Sister        age 72's  . Allergies Sister   . Diabetes Sister   . Kidney disease Sister   . Heart disease Sister        age 63's  . Esophageal cancer Neg Hx   . Rectal cancer Neg Hx   . Stomach cancer Neg Hx   . Amblyopia Neg Hx   . Blindness Neg Hx   . Glaucoma Neg Hx   . Macular degeneration Neg Hx   . Retinal detachment Neg Hx   . Cataracts Neg Hx   . Strabismus Neg Hx   . Retinitis pigmentosa Neg Hx     SOCIAL HISTORY Social History   Tobacco Use  . Smoking status: Former Smoker    Packs/day: 0.50    Years: 10.00    Pack years: 5.00    Last attempt to quit: 04/18/1980    Years since quitting: 36.9  . Smokeless tobacco: Never Used  Substance Use Topics  . Alcohol use: No    Alcohol/week: 0.0 oz  . Drug use: No         OPHTHALMIC EXAM:  Base Eye Exam    Visual Acuity (Snellen - Linear)      Right Left   Dist cc 20/50 20/40   Dist ph cc NI NI   Correction:  Glasses       Tonometry (Tonopen, 10:09 AM)      Right Left   Pressure 17 17       Pupils      Dark Light Shape React APD   Right 3 2 Round Slow None   Left 3 2 Round Slow None       Visual Fields (Counting fingers)      Left Right    Full  Full       Extraocular Movement      Right Left    Full, Ortho Full, Ortho       Neuro/Psych    Oriented x3:  Yes   Mood/Affect:  Normal       Dilation    Both eyes:  1.0% Mydriacyl, 2.5% Phenylephrine @ 10:09 AM        Slit Lamp and Fundus Exam    External Exam      Right Left   External Normal Normal       Slit Lamp Exam      Right Left   Lids/Lashes Dermatochalasis - upper lid Dermatochalasis - upper lid   Conjunctiva/Sclera melanosis  White and quiet   Cornea Clear well healed temporal insision   Anterior Chamber Moderate depth and quiet Deep and quiet, no cell or flare   Iris Round and dilated; no NVI dilates to 4.25 mm; no NVI    Lens 2-3+ Nuclear sclerosis with mild brunescence, 2-3+ Cortical cataract, Vacuoles Posterior chamber intraocular lens in good position    Vitreous Vitreous syneresis Vitreous syneresis - mild; 1+ anterior pigment       Fundus Exam      Right Left   Disc Normal Mild temporal Peripapillary atrophy/pigmentation   C/D Ratio 0.45 0.5   Macula Good foveal reflex, Flat; trace MA, Retinal pigment epithelial mottling Good foveal reflex, Trace ERM; trace MA; mild RPE changes;    Vessels Vascular attenuation; AV crossing changes, Copper wiring AV crossing changes; mild venous dilation, Copper wiring   Periphery Attached attached; SN blot hemes          IMAGING AND PROCEDURES  Imaging and Procedures for 03/19/17  OCT, Retina - OU - Both Eyes     Right Eye Quality was good. Central Foveal Thickness: 236. Progression has been stable. Findings include normal foveal contour, no IRF, no SRF.   Left Eye Quality was good. Central Foveal Thickness: 255. Progression has been stable. Findings include normal foveal contour, no SRF, no IRF.   Notes Images taken, stored on drive  Diagnosis / Impression:  No macular edema, IRF, SRF OU   Clinical management:  See below  Abbreviations: NFP - Normal foveal profile. CME - cystoid macular edema.  PED - pigment epithelial detachment. IRF - intraretinal fluid. SRF -  subretinal fluid. EZ - ellipsoid zone. ERM - epiretinal membrane. ORA -  outer retinal atrophy. ORT - outer retinal tubulation. SRHM - subretinal  hyper-reflective material        Fluorescein Angiography - OU - Both Eyes     Right Eye Progression has been stable.   Left Eye Progression has improved.   Notes Right Eye Early phase findings include microaneurysm. Mid/Late phase findings include microaneurysm, leakage.   Left Eye Early phase findings include microaneurysm, leakage. Mid/Late phase findings include leakage, microaneurysm.   Notes OD: scattered, punctate areas of  hyperfluorescence consistent with MAs OS: focal areas of hyperfluorescent leakage around disc and scattered throughout macula; scattered Mas -- interval improvement from prior study                ASSESSMENT/PLAN:    ICD-10-CM   1. 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 Retinal edema H35.81 OCT, Retina - OU - Both Eyes  4. Mild nonproliferative diabetic retinopathy of both eyes without macular edema associated with type 2 diabetes mellitus (  [place]) U93.2355 Fluorescein Angiography - OU - Both Eyes  5. Pseudophakia, left eye Z96.1   6. Senile nuclear sclerosis, right H25.11     1,2,3. Angiographic CME / focal chorioretinal inflammation / Posterior uveitis OS  - tr cystic changes on OCT, however FA shows some focal areas of late leakage from disc, macula consistent with chorioretinal inflammation  - etiology likely multifactorial with history of herpes zoster, s/p CE/PCIOL OS, and DM2  - currently on po valtrex (500 mg po TID), prednisolone acetate, and ketorolac QID OS  - S/P Subtenon's Kenalog OS (11.28.18)  - pt's BCVA stable at 20/40  - repeat FA today, 2.27.19, shows interval improvement in angiographic leakage  - discussed treatment options -- subtenon's kenalog vs mild immunosuppression with methotrexate vs observation -- pt wishes to monitor for now -- reasonable  - F/U 6-8 wks with repeat FA ([place], transit OS)   4. mild Non-Proliferative diabetic retinopathy, both eyes  - The incidence, risk factors for progression, natural history and treatment options for diabetic retinopathy  were discussed with patient.  The need for close monitoring of blood glucose, blood pressure, and serum lipids, avoiding cigarette or any type of tobacco, and the need for long term follow up was also discussed with patient.  - as above, may be contributing to inflammatory picture OS  - repeat FA otday,  2.27.19 suggests more of a leaking microaneurysmal picture that seems to be improving  5. Pseudophakia OS  - IOL in good position -- beautiful surgery by Dr. Kathlen Mody  - no frank CME on OCT, but mild leakage present on FA -- management as above  - will continue to monitor  6. Senile nuclear sclerosis OD  - The symptoms of cataract, surgical options, and treatments and risks were discussed with patient.  - discussed diagnosis and progression  - perhaps some mild progression OD  - under the expert care of Dr. Kathlen Mody   - management per Dr. Kathlen Mody  - from a retina standpoint, okay to proceed with CE/IOL OD when pt and surgeon are ready -- OS appears to be stable at this time.    Ophthalmic Meds Ordered this visit:  No orders of the defined types were placed in this encounter.      Return in about 7 weeks (around 05/07/2017) for F/U CME OS, OCT, [place]. FA.  There are no Patient Instructions on file for this visit.   Explained the diagnoses, plan, and follow up with the patient and they expressed understanding.  Patient expressed understanding of the importance of proper follow up care.   This document serves as a record of services personally performed by Gardiner Sleeper, MD, PhD. It was created on their behalf by Catha Brow, [place], a certified ophthalmic assistant. The creation of this record is the provider's dictation and/or activities during the visit.  Electronically signed by: Catha Brow, COA  03/19/17 1:05 PM    Gardiner Sleeper, M.D., Ph.D. Diseases & Surgery of the Retina and [place] 03/19/17    I have reviewed the above documentation for accuracy and completeness, and I agree with the above. Gardiner Sleeper, M.D., Ph.D. 03/19/17 1:09 PM      Abbreviations: M myopia (nearsighted); A astigmatism; H hyperopia (farsighted); P presbyopia; Mrx spectacle prescription;  CTL contact lenses; OD right eye; OS left eye; OU both eyes   XT exotropia; ET esotropia; PEK punctate epithelial keratitis; PEE punctate epithelial erosions; DES dry eye syndrome; MGD  meibomian gland dysfunction; ATs artificial tears; PFAT's preservative free artificial tears; [place] nuclear sclerotic cataract; PSC posterior subcapsular cataract; ERM epi-retinal membrane; PVD posterior vitreous detachment; RD retinal detachment; DM diabetes mellitus; DR diabetic retinopathy; NPDR non-proliferative diabetic retinopathy; PDR proliferative diabetic retinopathy; CSME clinically significant macular edema; DME diabetic macular edema; dbh dot blot hemorrhages; CWS cotton wool spot; POAG primary open angle glaucoma; C/D cup-to-disc ratio; HVF humphrey visual field; GVF goldmann visual field; OCT optical coherence tomography; IOP intraocular pressure; BRVO Branch retinal vein occlusion; CRVO central retinal vein occlusion; CRAO central retinal artery occlusion; BRAO branch retinal artery occlusion; RT retinal tear; SB scleral buckle; PPV pars plana vitrectomy; VH Vitreous hemorrhage; PRP panretinal laser photocoagulation; IVK intravitreal kenalog; VMT vitreomacular traction; MH Macular hole;  NVD neovascularization of the disc; NVE neovascularization elsewhere; AREDS age related eye disease study; ARMD age related macular degeneration; POAG primary open angle glaucoma; EBMD epithelial/anterior basement membrane dystrophy; ACIOL anterior chamber intraocular lens; IOL intraocular lens; PCIOL posterior chamber intraocular lens; Phaco/IOL phacoemulsification with intraocular lens placement; [place] photorefractive keratectomy; LASIK laser assisted in situ keratomileusis; HTN hypertension; DM diabetes mellitus; COPD chronic obstructive pulmonary disease

## 2017-03-19 ENCOUNTER — Ambulatory Visit (INDEPENDENT_AMBULATORY_CARE_PROVIDER_SITE_OTHER): Payer: Medicare Other | Admitting: Ophthalmology

## 2017-03-19 ENCOUNTER — Encounter (INDEPENDENT_AMBULATORY_CARE_PROVIDER_SITE_OTHER): Payer: Self-pay | Admitting: Ophthalmology

## 2017-03-19 DIAGNOSIS — H30002 Unspecified focal chorioretinal inflammation, left eye: Secondary | ICD-10-CM

## 2017-03-19 DIAGNOSIS — Z961 Presence of intraocular lens: Secondary | ICD-10-CM | POA: Diagnosis not present

## 2017-03-19 DIAGNOSIS — E113293 Type 2 diabetes mellitus with mild nonproliferative diabetic retinopathy without macular edema, bilateral: Secondary | ICD-10-CM | POA: Diagnosis not present

## 2017-03-19 DIAGNOSIS — H2511 Age-related nuclear cataract, right eye: Secondary | ICD-10-CM

## 2017-03-19 DIAGNOSIS — H3092 Unspecified chorioretinal inflammation, left eye: Secondary | ICD-10-CM | POA: Diagnosis not present

## 2017-03-19 DIAGNOSIS — H3581 Retinal edema: Secondary | ICD-10-CM | POA: Diagnosis not present

## 2017-03-19 NOTE — Addendum Note (Signed)
Addended by: Della Goo C on: 03/19/2017 10:38 AM   Modules accepted: Orders

## 2017-03-25 ENCOUNTER — Ambulatory Visit (HOSPITAL_COMMUNITY)
Admission: RE | Admit: 2017-03-25 | Discharge: 2017-03-25 | Disposition: A | Discharge status: Home / Self Care | Payer: Medicare Other | Source: Ambulatory Visit | Attending: Nephrology | Admitting: Nephrology

## 2017-03-25 VITALS — BP 176/84 | HR 63 | Temp 98.5°F | Resp 20

## 2017-03-25 DIAGNOSIS — D631 Anemia in chronic kidney disease: Secondary | ICD-10-CM | POA: Insufficient documentation

## 2017-03-25 DIAGNOSIS — N183 Chronic kidney disease, stage 3 unspecified: Secondary | ICD-10-CM

## 2017-03-25 LAB — POCT HEMOGLOBIN-HEMACUE: Hemoglobin: 10 g/dL — ABNORMAL LOW (ref 12.0–15.0)

## 2017-03-25 MED ORDER — EPOETIN ALFA 40000 UNIT/ML IJ SOLN: 30000.0000 [IU] | INTRAMUSCULAR | Status: DC

## 2017-03-25 MED ORDER — EPOETIN ALFA 20000 UNIT/ML IJ SOLN
INTRAMUSCULAR | Status: AC
  Administered 2017-03-25: 20000 [IU]
  Filled 2017-03-25: qty 1

## 2017-03-25 MED ORDER — EPOETIN ALFA 10000 UNIT/ML IJ SOLN
INTRAMUSCULAR | Status: AC
  Administered 2017-03-25: 10000 [IU]
  Filled 2017-03-25: qty 1

## 2017-04-01 IMAGING — CR DG HIP (WITH OR WITHOUT PELVIS) 2-3V*L*
4 series · 4 of 4 positions shown · non-contrast
Comparison: 05/22/2015.

CLINICAL DATA: Left hip pain after falling 2 days ago.

EXAM:
DG HIP (WITH OR WITHOUT PELVIS) 2-3V LEFT

[pelvis ap]
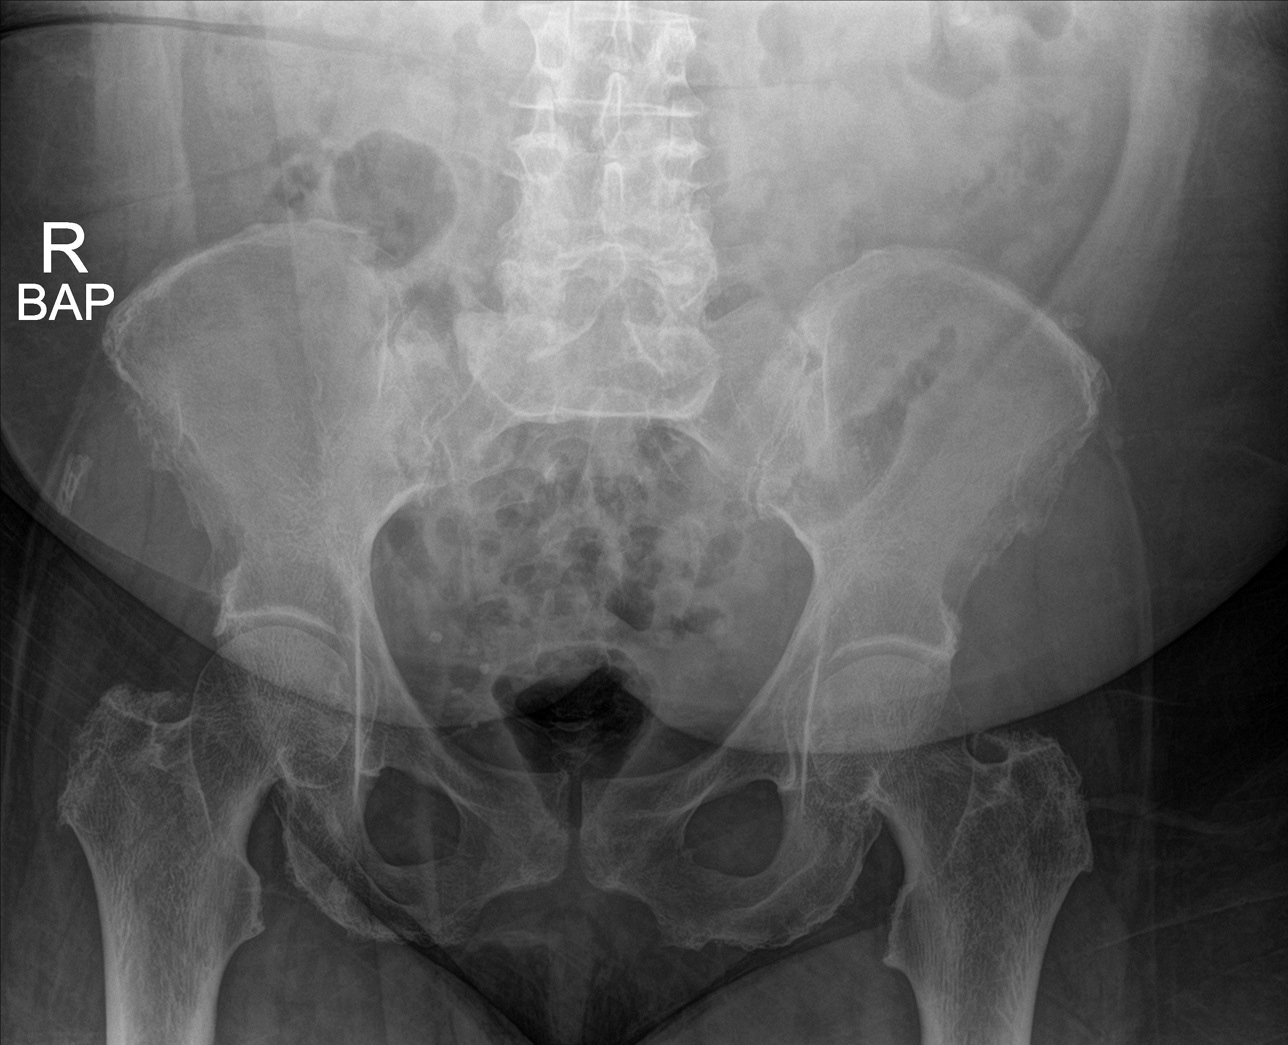

[hip ap]
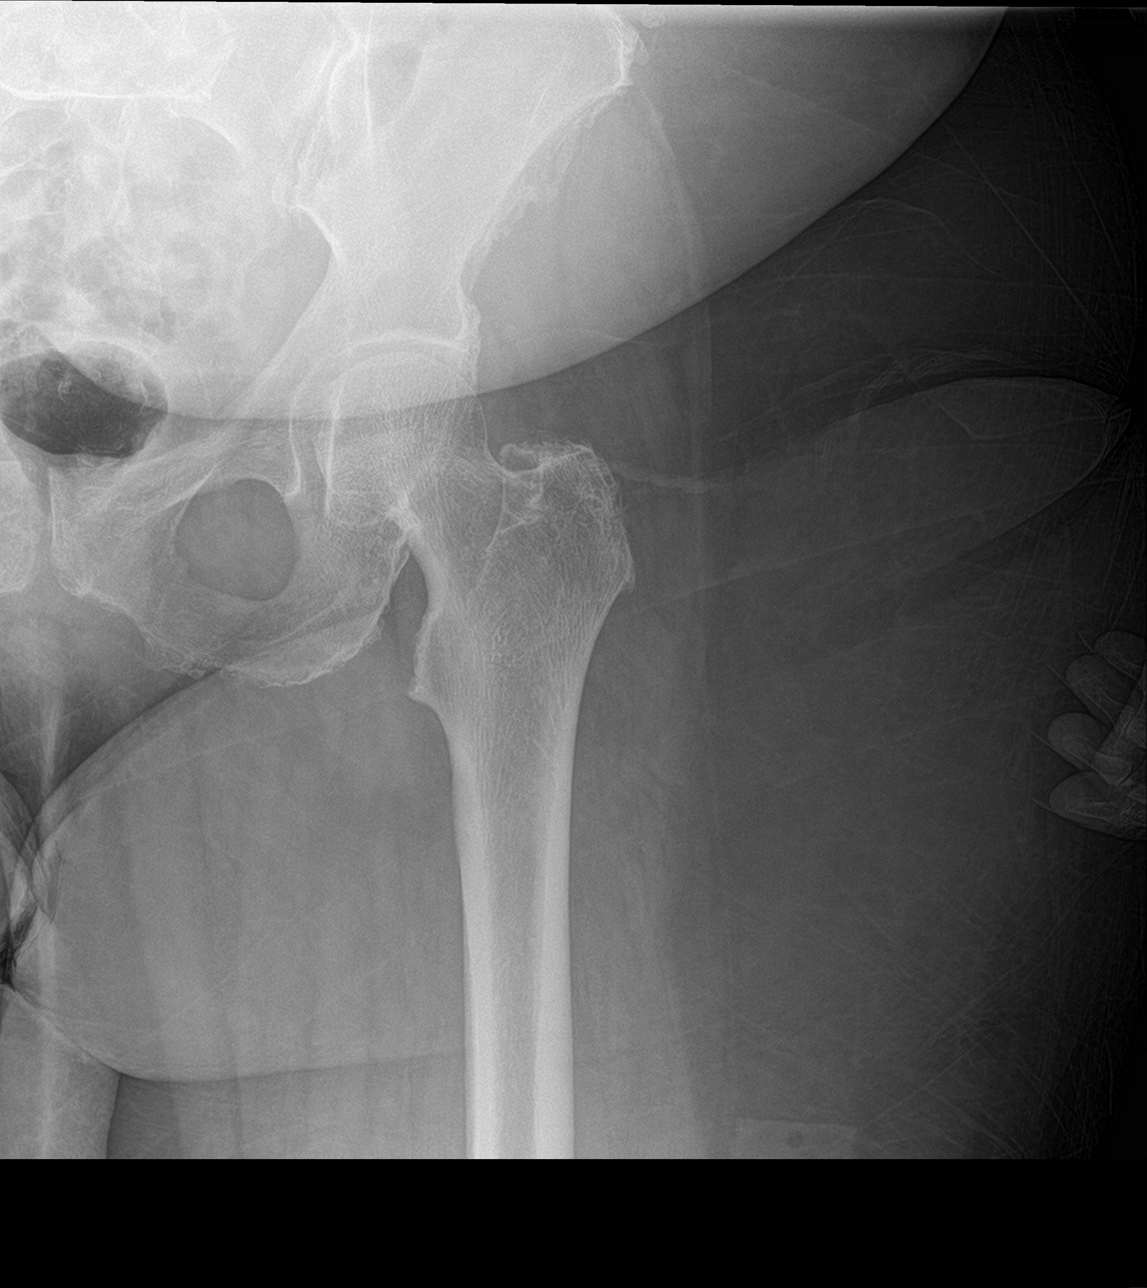

[hip x-table (1 of 2)]
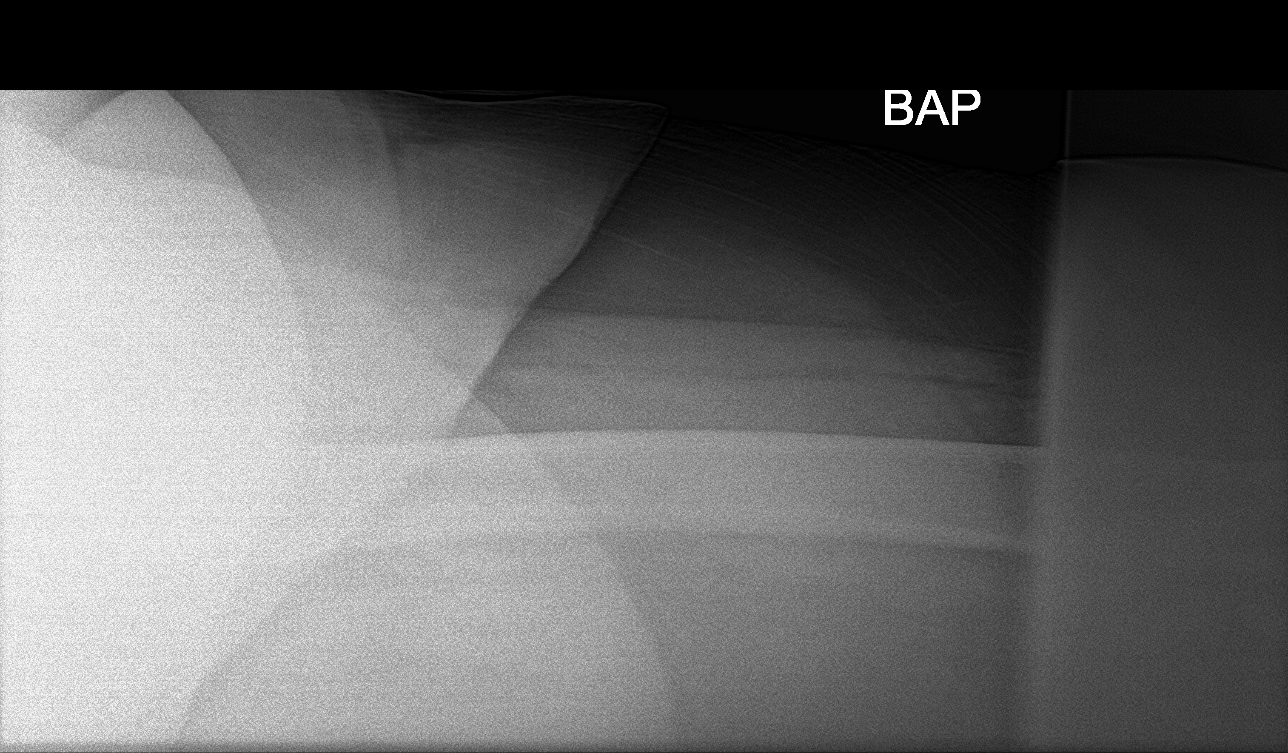

[hip x-table (2 of 2)]
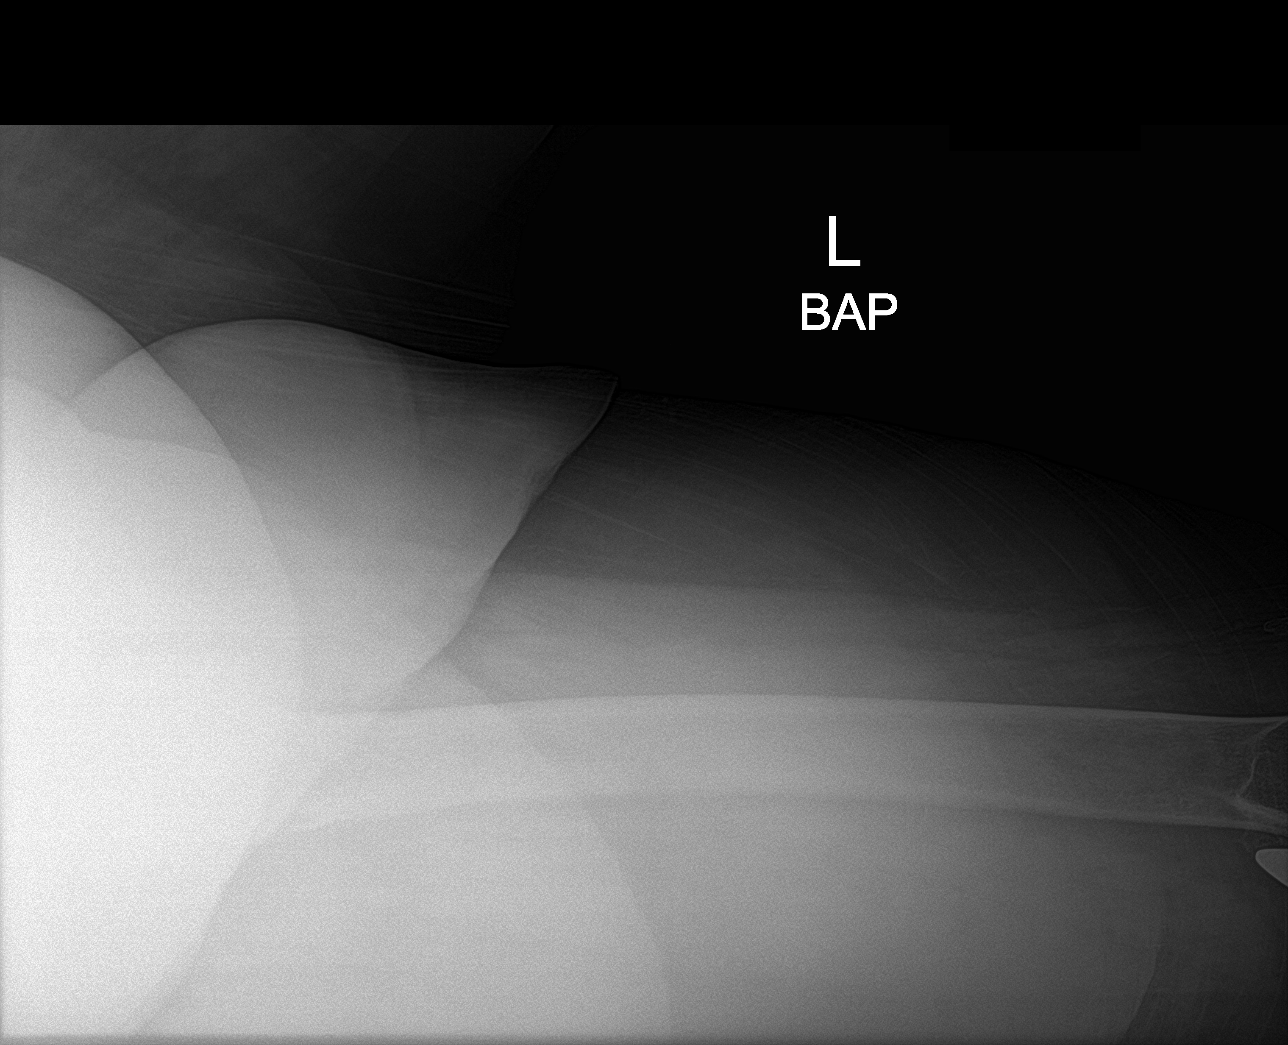

[4 of 4 positions shown; findings below may reference images not displayed]

FINDINGS: Diffuse osteopenia. No fracture or dislocation seen. Mild lower
lumbar spine degenerative changes.
IMPRESSION: No fracture or dislocation.

## 2017-04-01 IMAGING — CR DG KNEE COMPLETE 4+V*L*
4 series · 4 of 4 positions shown · non-contrast
Comparison: None.

CLINICAL DATA: Left knee pain and swelling.

EXAM:
LEFT KNEE - COMPLETE 4+ VIEW

[knee ap]
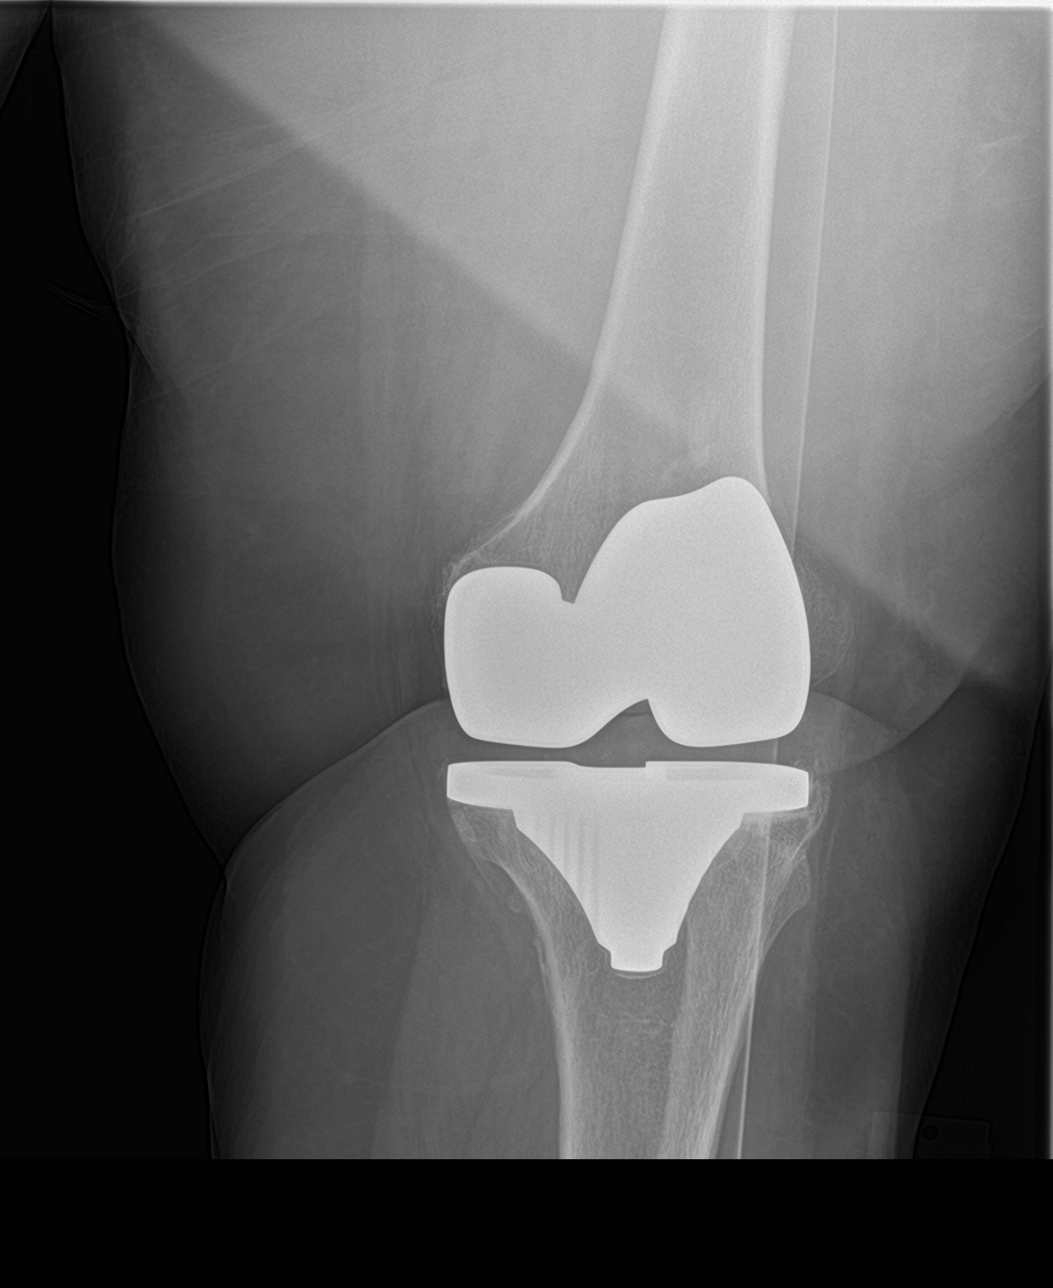

[knee lat]
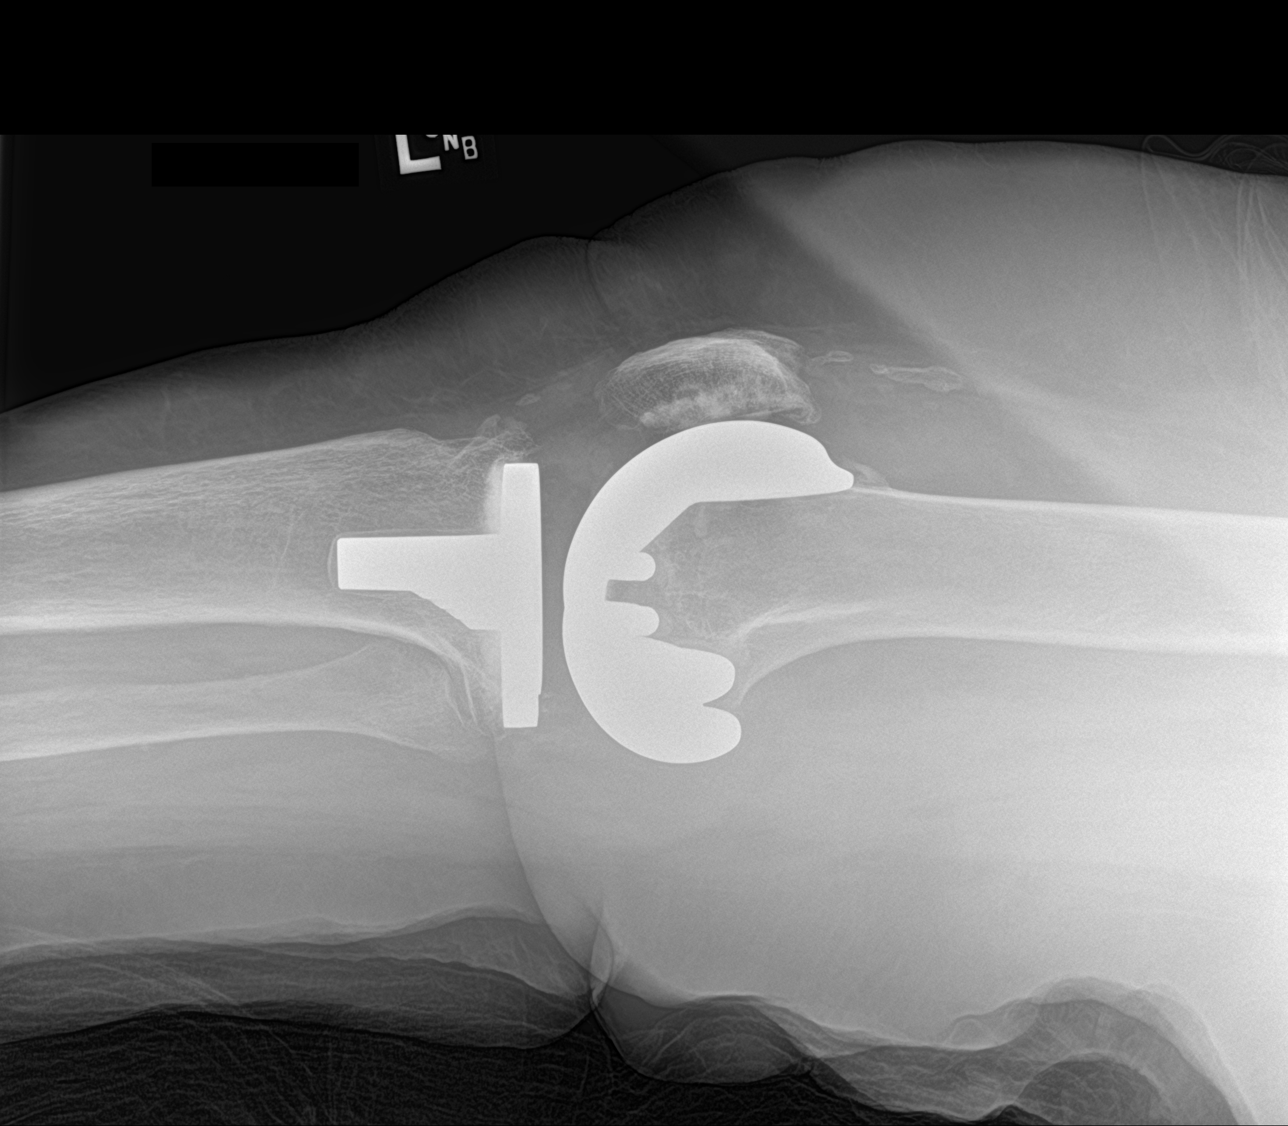

[knee obl (1 of 2)]
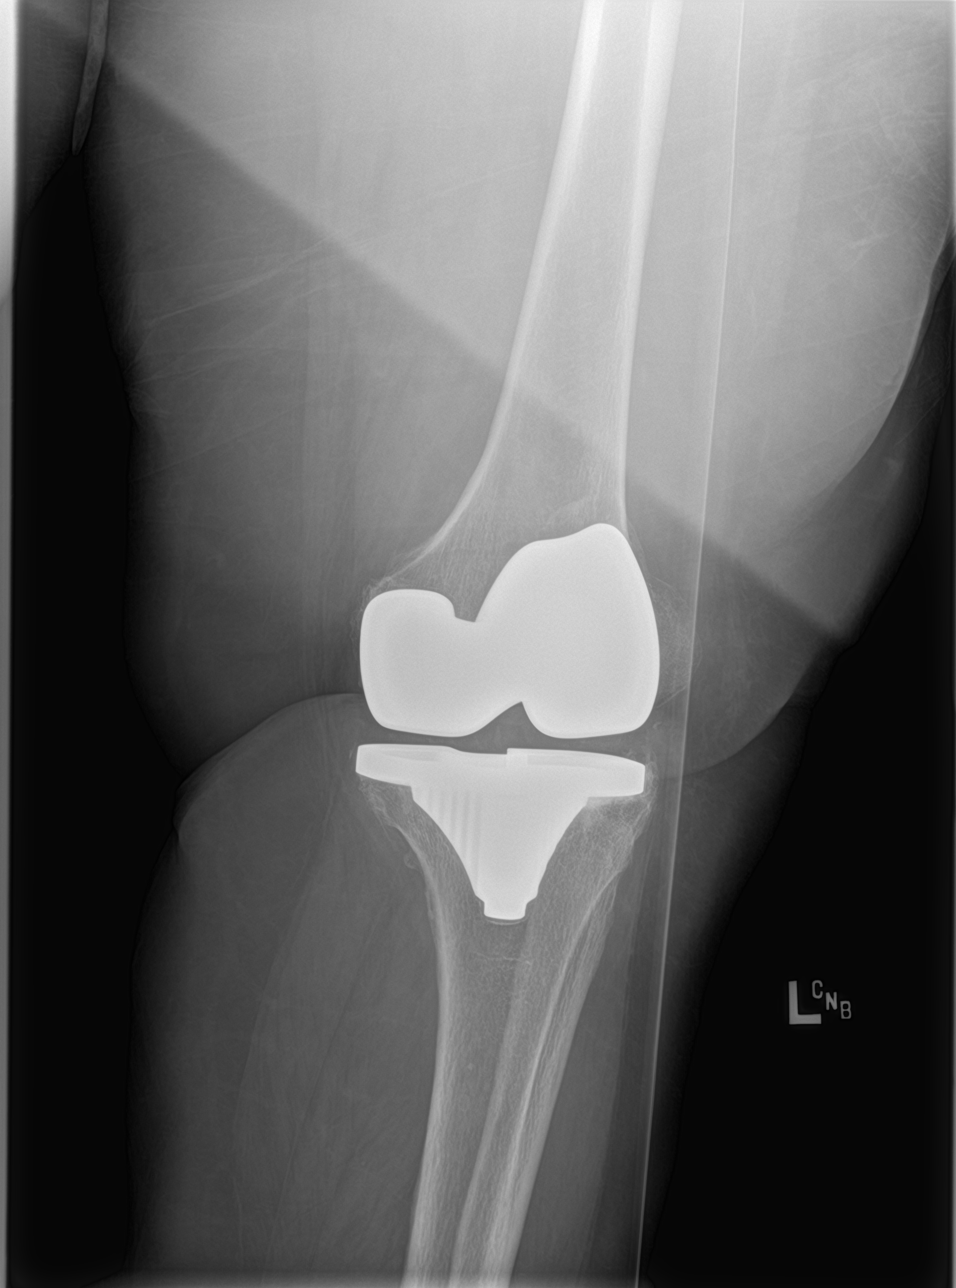

[knee obl (2 of 2)]
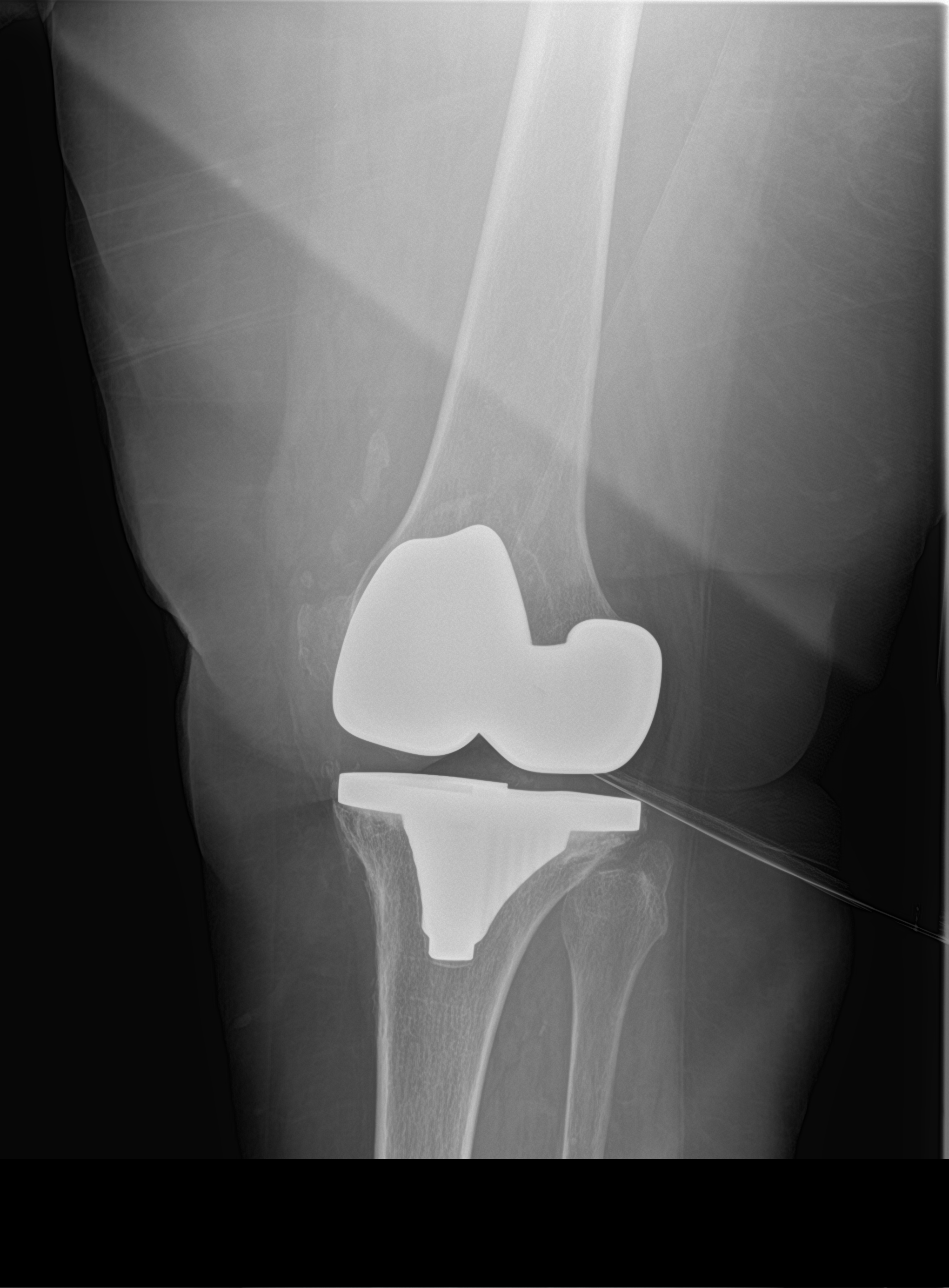

[4 of 4 positions shown; findings below may reference images not displayed]

FINDINGS: No evidence of fracture, dislocation, or joint effusion. There has
been a prior 3 component total left knee arthroplasty without
evidence of hardware fracture, loosening or misalignment.
IMPRESSION: Status post total left knee arthroplasty.

No evidence of acute fracture or hardware failure.

## 2017-04-08 ENCOUNTER — Ambulatory Visit (HOSPITAL_COMMUNITY)
Admission: RE | Admit: 2017-04-08 | Discharge: 2017-04-08 | Disposition: A | Discharge status: Home / Self Care | Payer: Medicare Other | Source: Ambulatory Visit | Attending: Nephrology | Admitting: Nephrology

## 2017-04-08 VITALS — BP 136/74 | HR 61 | Temp 98.3°F | Resp 20

## 2017-04-08 DIAGNOSIS — N183 Chronic kidney disease, stage 3 unspecified: Secondary | ICD-10-CM

## 2017-04-08 LAB — RENAL FUNCTION PANEL
Albumin: 3.1 g/dL — ABNORMAL LOW (ref 3.5–5.0)
Anion gap: 10 (ref 5–15)
BUN: 28 mg/dL — ABNORMAL HIGH (ref 6–20)
CO2: 29 mmol/L (ref 22–32)
Calcium: 8.3 mg/dL — ABNORMAL LOW (ref 8.9–10.3)
Chloride: 102 mmol/L (ref 101–111)
Creatinine, Ser: 1.76 mg/dL — ABNORMAL HIGH (ref 0.44–1.00)
GFR calc Af Amer: 32 mL/min — ABNORMAL LOW (ref >60)
GFR calc non Af Amer: 28 mL/min — ABNORMAL LOW (ref >60)
Glucose, Bld: 249 mg/dL — ABNORMAL HIGH (ref 65–99)
Phosphorus: 3.6 mg/dL (ref 2.5–4.6)
Potassium: 3.5 mmol/L (ref 3.5–5.1)
Sodium: 141 mmol/L (ref 135–145)

## 2017-04-08 LAB — IRON AND TIBC
Iron: 85 ug/dL (ref 28–170)
Saturation Ratios: 39 % — ABNORMAL HIGH (ref 10.4–31.8)
TIBC: 217 ug/dL — ABNORMAL LOW (ref 250–450)
UIBC: 132 ug/dL

## 2017-04-08 LAB — POCT HEMOGLOBIN-HEMACUE: HEMOGLOBIN: 10.7 g/dL — AB (ref 12.0–15.0)

## 2017-04-08 LAB — FERRITIN: Ferritin: 500 ng/mL — ABNORMAL HIGH (ref 11–307)

## 2017-04-08 MED ORDER — EPOETIN ALFA 20000 UNIT/ML IJ SOLN
INTRAMUSCULAR | Status: AC
  Administered 2017-04-08: 11:00:00 20000 [IU] via SUBCUTANEOUS
  Filled 2017-04-08: qty 1

## 2017-04-08 MED ORDER — EPOETIN ALFA 10000 UNIT/ML IJ SOLN
INTRAMUSCULAR | Status: AC
  Administered 2017-04-08: 10000 [IU] via SUBCUTANEOUS
  Filled 2017-04-08: qty 1

## 2017-04-08 MED ORDER — EPOETIN ALFA 40000 UNIT/ML IJ SOLN: 30000.0000 [IU] | INTRAMUSCULAR | Status: DC

## 2017-04-09 LAB — PTH, INTACT AND CALCIUM
CALCIUM TOTAL (PTH): 8.4 mg/dL — AB (ref 8.7–10.3)
PTH: 162 pg/mL — AB (ref 15–65)

## 2017-04-22 ENCOUNTER — Ambulatory Visit (HOSPITAL_COMMUNITY)
Admission: RE | Admit: 2017-04-22 | Discharge: 2017-04-22 | Disposition: A | Discharge status: Home / Self Care | Payer: Medicare Other | Source: Ambulatory Visit | Attending: Nephrology | Admitting: Nephrology

## 2017-04-22 VITALS — BP 169/87 | HR 65 | Temp 98.4°F | Resp 18

## 2017-04-22 DIAGNOSIS — N183 Chronic kidney disease, stage 3 unspecified: Secondary | ICD-10-CM

## 2017-04-22 LAB — POCT HEMOGLOBIN-HEMACUE: Hemoglobin: 10.4 g/dL — ABNORMAL LOW (ref 12.0–15.0)

## 2017-04-22 MED ORDER — EPOETIN ALFA 10000 UNIT/ML IJ SOLN
INTRAMUSCULAR | Status: AC
  Administered 2017-04-22: 10000 [IU] via SUBCUTANEOUS
  Filled 2017-04-22: qty 1

## 2017-04-22 MED ORDER — EPOETIN ALFA 20000 UNIT/ML IJ SOLN
INTRAMUSCULAR | Status: AC
  Administered 2017-04-22: 11:00:00 20000 [IU] via SUBCUTANEOUS
  Filled 2017-04-22: qty 1

## 2017-04-22 MED ORDER — EPOETIN ALFA 40000 UNIT/ML IJ SOLN: 30000.0000 [IU] | INTRAMUSCULAR | Status: DC

## 2017-05-05 ENCOUNTER — Other Ambulatory Visit (HOSPITAL_COMMUNITY): Payer: Self-pay | Admitting: *Deleted

## 2017-05-06 ENCOUNTER — Ambulatory Visit (INDEPENDENT_AMBULATORY_CARE_PROVIDER_SITE_OTHER): Payer: Medicare Other | Admitting: Pulmonary Disease

## 2017-05-06 ENCOUNTER — Encounter: Payer: Self-pay | Admitting: Pulmonary Disease

## 2017-05-06 ENCOUNTER — Encounter (HOSPITAL_COMMUNITY)
Admission: RE | Admit: 2017-05-06 | Discharge: 2017-05-06 | Disposition: A | Discharge status: Home / Self Care | Payer: Medicare Other | Source: Ambulatory Visit | Attending: Nephrology | Admitting: Nephrology

## 2017-05-06 VITALS — BP 130/88 | HR 68 | Wt 262.0 lb

## 2017-05-06 VITALS — BP 180/77 | HR 59 | Temp 97.9°F | Resp 20

## 2017-05-06 DIAGNOSIS — J9611 Chronic respiratory failure with hypoxia: Secondary | ICD-10-CM | POA: Diagnosis not present

## 2017-05-06 DIAGNOSIS — G4733 Obstructive sleep apnea (adult) (pediatric): Secondary | ICD-10-CM | POA: Diagnosis not present

## 2017-05-06 DIAGNOSIS — N183 Chronic kidney disease, stage 3 unspecified: Secondary | ICD-10-CM

## 2017-05-06 DIAGNOSIS — J986 Disorders of diaphragm: Secondary | ICD-10-CM

## 2017-05-06 DIAGNOSIS — R058 Other specified cough: Secondary | ICD-10-CM

## 2017-05-06 DIAGNOSIS — R05 Cough: Secondary | ICD-10-CM

## 2017-05-06 DIAGNOSIS — D631 Anemia in chronic kidney disease: Secondary | ICD-10-CM | POA: Insufficient documentation

## 2017-05-06 DIAGNOSIS — J449 Chronic obstructive pulmonary disease, unspecified: Secondary | ICD-10-CM | POA: Diagnosis not present

## 2017-05-06 LAB — RENAL FUNCTION PANEL
ALBUMIN: 3.4 g/dL — AB (ref 3.5–5.0)
ANION GAP: 11 (ref 5–15)
BUN: 26 mg/dL — ABNORMAL HIGH (ref 6–20)
CO2: 26 mmol/L (ref 22–32)
Calcium: 8.8 mg/dL — ABNORMAL LOW (ref 8.9–10.3)
Chloride: 104 mmol/L (ref 101–111)
Creatinine, Ser: 1.57 mg/dL — ABNORMAL HIGH (ref 0.44–1.00)
GFR calc Af Amer: 37 mL/min — ABNORMAL LOW (ref >60)
GFR, EST NON AFRICAN AMERICAN: 32 mL/min — AB (ref >60)
Glucose, Bld: 219 mg/dL — ABNORMAL HIGH (ref 65–99)
PHOSPHORUS: 3.3 mg/dL (ref 2.5–4.6)
POTASSIUM: 4 mmol/L (ref 3.5–5.1)
Sodium: 141 mmol/L (ref 135–145)

## 2017-05-06 LAB — IRON AND TIBC
Iron: 105 ug/dL (ref 28–170)
SATURATION RATIOS: 53 % — AB (ref 10.4–31.8)
TIBC: 199 ug/dL — ABNORMAL LOW (ref 250–450)
UIBC: 94 ug/dL

## 2017-05-06 LAB — FERRITIN: Ferritin: 377 ng/mL — ABNORMAL HIGH (ref 11–307)

## 2017-05-06 LAB — POCT HEMOGLOBIN-HEMACUE: HEMOGLOBIN: 10.8 g/dL — AB (ref 12.0–15.0)

## 2017-05-06 MED ORDER — EPOETIN ALFA 40000 UNIT/ML IJ SOLN: 30000.0000 [IU] | INTRAMUSCULAR | Status: DC

## 2017-05-06 MED ORDER — AZELASTINE HCL 0.1 % NA SOLN: 2.0000 | Freq: Two times a day (BID) | NASAL | 12 refills | Status: DC

## 2017-05-06 MED ORDER — FLUTICASONE FUROATE-VILANTEROL 100-25 MCG/INH IN AEPB: 1.0000 | INHALATION_SPRAY | Freq: Every day | RESPIRATORY_TRACT | 5 refills | Status: DC

## 2017-05-06 MED ORDER — EPOETIN ALFA 20000 UNIT/ML IJ SOLN
INTRAMUSCULAR | Status: AC
  Administered 2017-05-06: 20000 [IU] via SUBCUTANEOUS
  Filled 2017-05-06: qty 1

## 2017-05-06 MED ORDER — EPOETIN ALFA 10000 UNIT/ML IJ SOLN
INTRAMUSCULAR | Status: AC
  Administered 2017-05-06: 10000 [IU] via SUBCUTANEOUS
  Filled 2017-05-06: qty 1

## 2017-05-06 NOTE — Progress Notes (Signed)
[facility]  Chief Complaint  Patient presents with  . Follow-up    Pt is having chest pain and tightness, productive cough- green, wheezing and SOB with exertion increased in last 3 weeks.    Vital signs: BP 130/88 (BP Location: Left Arm, Cuff Size: Normal)   Pulse 68   Wt 262 lb (118.8 kg)   HC 64" (162.6 cm)   SpO2 95%   BMI 44.28 kg/m   History of Present Illness: Joann Gutierrez is a 72 y.o. 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  She is having more sinus congestion and post nasal drip.  This is causing more cough.  She is bringing up clear sputum.  Has some wheeze, but feels it is coming from her throat.  Denies fever, abdominal pain, skin rash, or hemoptysis.  She has been using breo daily.  Not using flonase on regular basis.  Physical Exam:  General - pleasant Eyes - pupils reactive, wears glasses ENT - no sinus tenderness, no oral exudate, no LAN, wears dentures Cardiac - regular, no murmur Chest - no wheeze, rales Abd - soft, non tender Ext - no edema Skin - no rashes Neuro - normal strength Psych - normal mood  Assessment/Plan:  COPD with asthma. - continue breo, singulair, and prn ventolin  Upper airway cough syndrome with post nasal drip. - main issue currently is from her sinuses - add astelin and use flonase on regular basis for next few weeks - continue singulair  Chronic respiratory failure from COPD/asthma, and Rt hemidiaphragm elevation. - 2 liters oxygen at night  Obstructive sleep apnea. 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 - reviewed options to assist with weight loss   Patient Instructions  Breo one puff daily, and rinse mouth after each use Montelukast (singulair) 10 mg pill nighlty Astelin two sprays in each nostril twice per day for 3 weeks, then as  needed Flonase 1 spray in each nostril daily for 3 weeks, then as needed Ventolin two puffs every four to six hours as needed for cough, wheeze, or chest congestion  Follow up in 6 months    [name], MD [place] 05/06/2017, 11:06 AM  Flow Sheet  Sleep tests: PSG 04/28/09 >> AHI 11, SpO2 low 68% ONO with CPAP and room air 10/22/12 >> Test time 10 hrs 59 min. Mean SpO2 92.4%, low SpO2 75%. Spent 16 min with SpO2 < 89%. CPAP 09/06/15 to 12/04/15 >> used on 17 of 90 nights with average 8 hrs 13 min.  Average AHI 0.7 with CPAP 9 cm H2O  Pulmonary tests Spirometry 07/05/10 >> FEV1 1.30 (64%), FEV1% 80 >> coughing during test RAST 12/11/12 >> negative, IgE 63.3 PFT 03/17/13 >> FEV1 1.27 (67%), FEV1% 84, TLC 4.06 (80%), DLCO 57%, borderline BD  Cardiac tests Lt, Rt Heart cath 03/03/13 >> RA 10, RV 38/7, PA 41/16 28, PCWP 13, LV 142/5, AO 137/76 Echo 05/23/15 >> EF 60 to 65%, mod LVH, grade 1 DD, mod TR, PAS 59 mmHg  Past Medical History: She  has a past medical history of Adenomatous colon polyp, Allergy, Anemia, Asthma, CAD (coronary artery disease), Carpal tunnel syndrome on left, CHF (congestive heart failure) ([place]), Chronic kidney disease (CKD), stage III (moderate) ([place]), COPD (chronic obstructive pulmonary disease) ([place]), CVA (cerebral infarction), Depression, Diabetes mellitus (1997), Diverticulosis, Elevated diaphragm (November 2011), Esophageal dysmotility, Esophageal  stricture, Gastritis, Gastroparesis (08/21/2007), GERD (gastroesophageal reflux disease), Hair loss, Hearing loss of both ears, Hernia, hiatal, Hyperlipidemia, Hypertension, Morbid obesity ([place]), Obesity, Osteoarthritis, OSTEOARTHRITIS (08/09/2006), Osteoporosis, PERIPHERAL NEUROPATHY, FEET (09/23/2007), RENAL INSUFFICIENCY (02/16/2009), 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  Past Surgical History: She   has a past surgical history that includes Abdominal hysterectomy; Replacement total knee (Left, 1998); Esophagogastroduodenoscopy (06/25/2006); ERD (08/08/2000); Artery Biopsy (01/07/2011); Breast lumpectomy; Breast lumpectomy; Hernia repair; Colonoscopy; Upper gastrointestinal endoscopy; bil foot surgery; left and right heart catheterization with coronary angiogram (N/A, 03/03/2013); Esophageal manometry (N/A, 03/13/2015); Esophagogastroduodenoscopy (egd) with propofol (N/A, 07/05/2015); Colonoscopy with propofol (N/A, 07/05/2015); and Cataract extraction (Left, 06/2016).  Family History: Her family history includes Allergies in her sister; Cancer in her brother; Colon cancer in her brother; Diabetes in her mother, sister, and sister; Heart attack in her father; Heart disease in her mother, sister, and sister; Heart disease (age of onset: 66) in her father; Kidney disease in her mother, sister, and sister; Liver cancer in her mother; Sickle cell anemia in her father.  Social History: She  reports that she quit smoking about 37 years ago. She has a 5.00 pack-year smoking history. She has never used smokeless tobacco. She reports that she does not drink alcohol or use drugs.  Medications: Allergies as of 05/06/2017      Reactions   Morphine And Related Other (See Comments)   Family request not to be given, reports pt does not wake up when given    Promethazine Hcl Other (See Comments)   REACTION: lethargy      Medication List        Accurate as of 05/06/17 11:06 AM. Always use your most recent med list.          aspirin EC 81 MG tablet Take 81 mg by mouth daily.   atorvastatin 40 MG tablet Commonly known as:  LIPITOR Take 40 mg by mouth daily.   azelastine 0.1 % nasal spray Commonly known as:  ASTELIN Place 2 sprays into both nostrils 2 (two) times daily. Use in each nostril as directed   carvedilol 12.5 MG tablet Commonly known as:  COREG Take 12.5 mg by mouth 2 (two) times daily with  a meal.   doxazosin 2 MG tablet Commonly known as:  CARDURA Take 4 mg by mouth at bedtime.   ezetimibe 10 MG tablet Commonly known as:  ZETIA Take 10 mg by mouth at bedtime.   fluticasone 50 MCG/ACT nasal spray Commonly known as:  FLONASE PLACE TWO SPRAYS INTO BOTH NOSTRILS DAILY   fluticasone furoate-vilanterol 100-25 MCG/INH Aepb Commonly known as:  BREO ELLIPTA Inhale 1 puff into the lungs daily.   furosemide 80 MG tablet Commonly known as:  LASIX Take 1 tablet (80 mg total) by mouth daily.   gabapentin 300 MG capsule Commonly known as:  NEURONTIN Take 1 capsule (300 mg total) by mouth 2 (two) times daily.   guaiFENesin 600 MG 12 hr tablet Commonly known as:  MUCINEX Take 2 tablets (1,200 mg total) by mouth 2 (two) times daily as needed for cough or to loosen phlegm.   hydrALAZINE 100 MG tablet Commonly known as:  APRESOLINE Take 100 mg by mouth 3 (three) times daily.   isosorbide mononitrate 120 MG 24 hr tablet Commonly known as:  IMDUR Take 1 tablet (120 mg total) by mouth daily. PATIENT NEEDS TO CONTACT OFFICE FOR ADDITIONAL REFILLS   ketorolac 0.5 %  ophthalmic solution Commonly known as:  ACULAR PLACE ONE DROP INTO THE LEFT EYE FOUR TIMES A DAY   KLOR-CON M20 20 MEQ tablet Generic drug:  potassium chloride SA Take 20 mEq by mouth daily.   LEVEMIR FLEXTOUCH 100 UNIT/ML Pen Generic drug:  Insulin Detemir Inject 15 Units as directed every morning.   losartan 100 MG tablet Commonly known as:  COZAAR Take 100 mg by mouth daily.   metolazone 2.5 MG tablet Commonly known as:  ZAROXOLYN Take 2.5 mg by mouth daily.   montelukast 10 MG tablet Commonly known as:  SINGULAIR TAKE ONE TABLET BY MOUTH EVERY DAY.   nitroGLYCERIN 0.4 MG SL tablet Commonly known as:  NITROSTAT PLACE ONE TABLET UNDER THE TONGUE EVERY FIVE MINUTES AS NEEDED FOR CHEST PAIN   omeprazole 20 MG capsule Commonly known as:  PRILOSEC Take 1 capsule (20 mg total) by mouth 2 (two) times  daily before a meal.   OXYGEN Inhale into the lungs. CPAP with oxygen at bedtime   raloxifene 60 MG tablet Commonly known as:  EVISTA Take 60 mg by mouth every morning.   sucralfate 1 g tablet Commonly known as:  CARAFATE Take 1 g by mouth 4 (four) times daily -  with meals and at bedtime.   valACYclovir 500 MG tablet Commonly known as:  VALTREX Take 1 tablet (500 mg total) by mouth 3 (three) times daily.   VENTOLIN HFA 108 (90 Base) MCG/ACT inhaler Generic drug:  albuterol INHALE 2 PUFFS INTO THE LUNGS EVERY SIX HOURS AS NEEDED FOR SHORTNESSOF BREATH   Vitamin D (Ergocalciferol) 50000 units Caps capsule Commonly known as:  DRISDOL Take 50,000 Units by mouth every Monday.

## 2017-05-06 NOTE — Patient Instructions (Addendum)
Breo one puff daily, and rinse mouth after each use Montelukast (singulair) 10 mg pill nighlty Astelin two sprays in each nostril twice per day for 3 weeks, then as needed Flonase 1 spray in each nostril daily for 3 weeks, then as needed Ventolin two puffs every four to six hours as needed for cough, wheeze, or chest congestion  Follow up in 6 months

## 2017-05-06 NOTE — Progress Notes (Signed)
[facility] Note  05/07/2017     CHIEF COMPLAINT Patient presents for Retina Follow Up   HISTORY OF PRESENT ILLNESS: Joann Gutierrez is a 72 y.o. female who presents to the clinic today for:   HPI    Retina Follow Up    Patient presents with  Other.  In both eyes.  This started 4 months ago.  Severity is mild.  Since onset it is stable.  I, the attending physician,  performed the HPI with the patient and updated documentation appropriately.          Comments    F/U CME OS/Chorioretnal inflam./Post uveitis OS. Patient states her vision is about the same "I can't read small print yet". Pt reports having occasional flashes of light OS, floaters OD and achy eyes OU. Bs fluctuates but, denies visual changes. Bs yesterday (05/06/17) 105. A1C unknown. Pt is using gtt's as instructed.         Last edited by Bernarda Caffey, MD on 05/07/2017 10:46 AM. (History)    Pt states she continues to have trouble reading; Reports she is taking gtts as directed;   Referring physician: Seward Carol, MD [address], [place] [id number]  HISTORICAL INFORMATION:   Selected notes from the MEDICAL RECORD NUMBER Referral from Dr. Read Drivers for r/o angiographic CME Ocular Hx - HZO OS; Pseudophakia OS (07/10/16); cellophane OS; HTN; PMH - DM II on insulin; Hx stroke;    CURRENT MEDICATIONS: Current Outpatient Medications (Ophthalmic Drugs)  Medication Sig  . ketorolac (ACULAR) 0.5 % ophthalmic solution PLACE ONE DROP INTO THE LEFT EYE FOUR TIMES A DAY   Current Facility-Administered Medications (Ophthalmic Drugs)  Medication Route  . triamcinolone acetonide (TRIESENCE) 40 MG/ML subtenons injection 20 mg Subtenons   Current Outpatient Medications (Other)  Medication Sig  . aspirin EC 81 MG tablet Take 81 mg by mouth daily.  Marland Kitchen atorvastatin (LIPITOR) 40 MG tablet Take 40 mg by mouth daily.  Marland Kitchen azelastine (ASTELIN) 0.1 % nasal spray Place 2 sprays  into both nostrils 2 (two) times daily. Use in each nostril as directed  . carvedilol (COREG) 12.5 MG tablet Take 12.5 mg by mouth 2 (two) times daily with a meal.  . doxazosin (CARDURA) 2 MG tablet Take 4 mg by mouth at bedtime.   Marland Kitchen ezetimibe (ZETIA) 10 MG tablet Take 10 mg by mouth at bedtime.   . fluticasone (FLONASE) 50 MCG/ACT nasal spray PLACE TWO SPRAYS INTO BOTH NOSTRILS DAILY  . fluticasone furoate-vilanterol (BREO ELLIPTA) 100-25 MCG/INH AEPB Inhale 1 puff into the lungs daily.  . furosemide (LASIX) 80 MG tablet Take 1 tablet (80 mg total) by mouth daily. (Patient taking differently: Take 80 mg by mouth 2 (two) times daily. )  . gabapentin (NEURONTIN) 300 MG capsule Take 1 capsule (300 mg total) by mouth 2 (two) times daily.  Marland Kitchen guaiFENesin (MUCINEX) 600 MG 12 hr tablet Take 2 tablets (1,200 mg total) by mouth 2 (two) times daily as needed for cough or to loosen phlegm.  . hydrALAZINE (APRESOLINE) 100 MG tablet Take 100 mg by mouth 3 (three) times daily.  . isosorbide mononitrate (IMDUR) 120 MG 24 hr tablet Take 1 tablet (120 mg total) by mouth daily. PATIENT NEEDS TO CONTACT OFFICE FOR ADDITIONAL REFILLS  . KLOR-CON M20 20 MEQ tablet Take 20 mEq by mouth daily.   Marland Kitchen LEVEMIR FLEXTOUCH 100 UNIT/ML Pen Inject 15 Units as directed every morning.  Marland Kitchen losartan (  COZAAR) 100 MG tablet Take 100 mg by mouth daily.  . metolazone (ZAROXOLYN) 2.5 MG tablet Take 2.5 mg by mouth daily.  . montelukast (SINGULAIR) 10 MG tablet TAKE ONE TABLET BY MOUTH EVERY DAY.  . nitroGLYCERIN (NITROSTAT) 0.4 MG SL tablet PLACE ONE TABLET UNDER THE TONGUE EVERY FIVE MINUTES AS NEEDED FOR CHEST PAIN  . OXYGEN Inhale into the lungs. CPAP with oxygen at bedtime  . raloxifene (EVISTA) 60 MG tablet Take 60 mg by mouth every morning.   . sucralfate (CARAFATE) 1 g tablet Take 1 g by mouth 4 (four) times daily -  with meals and at bedtime.  . valACYclovir (VALTREX) 500 MG tablet Take 1 tablet (500 mg total) by mouth 3 (three)  times daily.  . VENTOLIN HFA 108 (90 Base) MCG/ACT inhaler INHALE 2 PUFFS INTO THE LUNGS EVERY SIX HOURS AS NEEDED FOR SHORTNESSOF BREATH  . Vitamin D, Ergocalciferol, (DRISDOL) 50000 UNITS CAPS capsule Take 50,000 Units by mouth every Monday.   Marland Kitchen omeprazole (PRILOSEC) 20 MG capsule Take 1 capsule (20 mg total) by mouth 2 (two) times daily before a meal.   No current facility-administered medications for this visit.  (Other)      REVIEW OF SYSTEMS: ROS    Positive for: Musculoskeletal, Endocrine, Cardiovascular, Eyes, Respiratory   Negative for: Constitutional, Gastrointestinal, Neurological, Skin, Genitourinary, HENT, Psychiatric, Allergic/Imm, Heme/Lymph   Last edited by Zenovia Jordan, LPN on 3/66/4403  4:74 AM. (History)       ALLERGIES Allergies  Allergen Reactions  . Morphine And Related Other (See Comments)    Family request not to be given, reports pt does not wake up when given   . Promethazine Hcl Other (See Comments)    REACTION: lethargy    PAST MEDICAL HISTORY Past Medical History:  Diagnosis Date  . Adenomatous colon polyp   . Allergy   . Anemia   . Asthma       . CAD (coronary artery disease)    Mild very minimal coronary disease with 20% obtuse marginal stenosis  . Carpal tunnel syndrome on left   . CHF (congestive heart failure) ([place])   . Chronic kidney disease (CKD), stage III (moderate) (HCC)   . COPD (chronic obstructive pulmonary disease) ([place])   . CVA (cerebral infarction)   . Depression   . 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 Diverticulosis   . 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 Esophageal dysmotility   . Esophageal stricture   . Gastritis   . Gastroparesis 08/21/2007  . GERD (gastroesophageal reflux disease)   . Hair loss   . Hearing loss of both ears   . Hernia, hiatal   . Hyperlipidemia   . Hypertension   . Morbid obesity ([place])   . Obesity   . Osteoarthritis   . OSTEOARTHRITIS 08/09/2006  . Osteoporosis   .  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 RENAL INSUFFICIENCY 02/16/2009  . 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 Seizures ([place])    pt thinks it has been several monthes since she had a seisure  . Shingles   . Sickle cell trait ([place])   . Stroke ([place])   . Tubular adenoma of colon    Past Surgical History:  Procedure Laterality Date  . ABDOMINAL HYSTERECTOMY    . ARTERY BIOPSY  01/07/2011   Procedure: MINOR BIOPSY TEMPORAL ARTERY;  Surgeon: Haywood Lasso, MD;  Location: [facility];  Service: General;  Laterality: Left;  left temporal artery biopsy  . bil foot surgery    . BREAST LUMPECTOMY     benign  . BREAST LUMPECTOMY     both breast lumps removed   . CATARACT EXTRACTION Left 06/2016   Dr. Read Drivers  . COLONOSCOPY    . COLONOSCOPY WITH PROPOFOL N/A 07/05/2015   Procedure: COLONOSCOPY WITH PROPOFOL;  Surgeon: Manus Gunning, MD;  Location: [facility];  Service: Gastroenterology;  Laterality: N/A;  . ERD  08/08/2000  . ESOPHAGEAL MANOMETRY N/A 03/13/2015   Procedure: ESOPHAGEAL MANOMETRY (EM);  Surgeon: Manus Gunning, MD;  Location: [facility];  Service: Gastroenterology;  Laterality: N/A;  . ESOPHAGOGASTRODUODENOSCOPY  06/25/2006  . ESOPHAGOGASTRODUODENOSCOPY (EGD) WITH PROPOFOL N/A 07/05/2015   Procedure: ESOPHAGOGASTRODUODENOSCOPY (EGD) WITH PROPOFOL;  Surgeon: Manus Gunning, MD;  Location: [facility];  Service: Gastroenterology;  Laterality: N/A;  . HERNIA REPAIR    . LEFT AND RIGHT HEART CATHETERIZATION WITH CORONARY ANGIOGRAM N/A 03/03/2013   Procedure: LEFT AND RIGHT HEART CATHETERIZATION WITH CORONARY ANGIOGRAM;  Surgeon: Minus Breeding, MD;  Location[facility];  Service: Cardiovascular;  Laterality: N/A;  . REPLACEMENT TOTAL KNEE Left 1998  . UPPER GASTROINTESTINAL ENDOSCOPY      FAMILY HISTORY Family History  Problem Relation Age of Onset  . Liver cancer Mother        Liver Cancer  . Diabetes Mother   . Kidney  disease Mother   . Heart disease Mother        age 77's  . Heart disease Father 25       MI  . Heart attack Father        died of MI when pt was 65  . Sickle cell anemia Father   . Colon cancer Brother   . Cancer Brother        Colon Cancer  . Diabetes Sister   . Kidney disease Sister   . Heart disease Sister        age 26's  . Allergies Sister   . Diabetes Sister   . Kidney disease Sister   . Heart disease Sister        age 51's  . Esophageal cancer Neg Hx   . Rectal cancer Neg Hx   . Stomach cancer Neg Hx   . Amblyopia Neg Hx   . Blindness Neg Hx   . Glaucoma Neg Hx   . Macular degeneration Neg Hx   . Retinal detachment Neg Hx   . Cataracts Neg Hx   . Strabismus Neg Hx   . Retinitis pigmentosa Neg Hx     SOCIAL HISTORY Social History   Tobacco Use  . Smoking status: Former Smoker    Packs/day: 0.50    Years: 10.00    Pack years: 5.00    Last attempt to quit: 04/18/1980    Years since quitting: 37.0  . Smokeless tobacco: Never Used  Substance Use Topics  . Alcohol use: No    Alcohol/week: 0.0 oz  . Drug use: No         OPHTHALMIC EXAM:  Base Eye Exam    Visual Acuity (Snellen - Linear)      Right Left   Dist cc 20/60 -1 20/50   Dist ph cc 20/50 20/40 -1   Correction:  Glasses       Tonometry (Tonopen, 9:29 AM)      Right Left   Pressure 15 18       Pupils  Dark Light Shape React APD   Right 3 2 Round Slow None   Left 3 2 Round Slow None       Visual Fields (Counting fingers)      Left Right    Full Full       Extraocular Movement      Right Left    Full, Ortho Full, Ortho       Neuro/Psych    Oriented x3:  Yes   Mood/Affect:  Normal       Dilation    Both eyes:  1.0% Mydriacyl, 2.5% Phenylephrine @ 9:29 AM        Slit Lamp and Fundus Exam    External Exam      Right Left   External Normal Normal       Slit Lamp Exam      Right Left   Lids/Lashes Dermatochalasis - upper lid Dermatochalasis - upper lid    Conjunctiva/Sclera melanosis  White and quiet   Cornea Clear well healed temporal insision   Anterior Chamber Moderate depth and quiet Deep and quiet, no cell or flare   Iris Round and dilated; no NVI dilates to 4.25 mm; no NVI   Lens 2-3+ Nuclear sclerosis with mild brunescence, 2-3+ Cortical cataract, Vacuoles Posterior chamber intraocular lens in good position    Vitreous Vitreous syneresis Vitreous syneresis - mild; 1+ anterior pigment       Fundus Exam      Right Left   Disc Pink and Sharp Mild temporal Peripapillary atrophy/pigmentation   C/D Ratio 0.45 0.5   Macula Good foveal reflex, Flat; trace MA, Retinal pigment epithelial mottling Good foveal reflex, Trace ERM; trace MA; mild RPE changes;    Vessels Vascular attenuation; AV crossing changes, Copper wiring AV crossing changes; mild venous dilation, Copper wiring   Periphery Attached attached; SN blot hemes        Refraction    Wearing Rx      Sphere Cylinder Axis Add   Right -2.25 +1.25 005 +2.75   Left -2.25 +1.75 015 +2.75   Type:  PAL          IMAGING AND PROCEDURES  Imaging and Procedures for 05/08/17  OCT, Retina - OU - Both Eyes       Right Eye Quality was good. Central Foveal Thickness: 239. Progression has been stable. Findings include normal foveal contour, no IRF, no SRF.   Left Eye Quality was good. Central Foveal Thickness: 254. Progression has been stable. Findings include normal foveal contour, no SRF, no IRF.   Notes Images taken, stored on drive  Diagnosis / Impression:  No macular edema, IRF, SRF OU   Clinical management:  See below  Abbreviations: NFP - Normal foveal profile. CME - cystoid macular edema.  PED - pigment epithelial detachment. IRF - intraretinal fluid. SRF -  subretinal fluid. EZ - ellipsoid zone. ERM - epiretinal membrane. ORA -  outer retinal atrophy. ORT - outer retinal tubulation. SRHM - subretinal  hyper-reflective material        Fluorescein Angiography  [place] (Transit OS)       Right Eye Progression has improved. Early phase findings include microaneurysm. Mid/Late phase findings include microaneurysm, leakage.   Left Eye Progression has been stable. Early phase findings include microaneurysm, leakage. Mid/Late phase findings include leakage, microaneurysm.   Notes Impression:  OD: scattered, punctate areas of hyperfluorescence consistent with MAs OS: scattered Mas w/ late leakage - stable; hyperfluorescence of disc improved from prior  study                ASSESSMENT/PLAN:    ICD-10-CM   1. 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 Retinal edema H35.81 OCT, Retina - OU - Both Eyes  4. Mild nonproliferative diabetic retinopathy of both eyes without macular edema associated with type 2 diabetes mellitus ([place]) W09.8119 Fluorescein Angiography [place] (Transit OS)  5. Pseudophakia, left eye Z96.1   6. Senile nuclear sclerosis, right H25.11     1,2,3. Angiographic CME / focal chorioretinal inflammation / Posterior uveitis OS -- improved  - tr cystic changes on OCT, however prior FAs showed focal areas of late leakage from disc, macula consistent with chorioretinal inflammation  - etiology likely multifactorial with history of herpes zoster, s/p CE/PCIOL OS, and DM2  - currently on po valtrex (500 mg po TID), prednisolone acetate and ketorolac QID OS  - S/P Subtenon's Kenalog OS (11.28.18)  - pt's BCVA stable at 20/40  - repeat FA today, 4.17.19, shows interval improvement in angiographic leakage  - suspect most of FA abnormalities from DM2  - will trial tapering off PF and ketorolac -- dec PF BID OS, stop ketorolac  - F/U 6-8 wks with repeat FA ([place]idelberg vs Optos, transit OS)   4. mild Non-Proliferative diabetic retinopathy, both eyes  -  The incidence, risk factors for progression, natural history and treatment options for diabetic retinopathy  were discussed with patient.  The need for close monitoring of blood glucose, blood pressure, and serum lipids, avoiding cigarette or any type of tobacco, and the need for long term follow up was also discussed with patient.  - as above, may be contributing to inflammatory picture OS  - repeat FAs 2.27.19, 4.17.19 suggest more of a leaking microaneurysmal picture that seems to be improving   - no frank DME  - monitor  5. Pseudophakia OS  - IOL in good position -- beautiful surgery by Dr. Kathlen Mody  - no frank CME on OCT, but mild leakage present on FA -- management as above  - will continue to monitor  6. Senile nuclear sclerosis OD  - The symptoms of cataract, surgical options, and treatments and risks were discussed with patient.  - discussed diagnosis and progression  - perhaps some mild progression OD  - under the expert care of Dr. Kathlen Mody   - management per Dr. Kathlen Mody  - from a retina standpoint, okay to proceed with CE/IOL OD when pt and surgeon are ready -- OS appears to be stable at this time    Ophthalmic Meds Ordered this visit:  No orders of the defined types were placed in this encounter.      Return in about 7 weeks (around 06/25/2017) for F/U CME OS, Dilated exam, OCT, FA.  There are no Patient Instructions on file for this visit.   Explained the diagnoses, plan, and follow up with the patient and they expressed understanding.  Patient expressed understanding of the importance of proper follow up care.   This document serves as a record of services personally performed by Gardiner Sleeper, MD, PhD. It was created on their behalf by Catha Brow, [place], a certified ophthalmic assistant. The creation of this record is the provider's dictation and/or activities during the visit.  Electronically signed by: Catha Brow, [place]  05/08/17 9:52 AM  Gardiner Sleeper,  M.D., Ph.D. Diseases & Surgery of the Retina and [place] 05/08/17  I have reviewed the above documentation for accuracy and completeness, and I agree with the above. Gardiner Sleeper, M.D., Ph.D. 05/08/17 9:52 AM     Abbreviations: M myopia (nearsighted); A astigmatism; H hyperopia (farsighted); P presbyopia; Mrx spectacle prescription;  CTL contact lenses; OD right eye; OS left eye; OU both eyes  XT exotropia; ET esotropia; PEK punctate epithelial keratitis; PEE punctate epithelial erosions; DES dry eye syndrome; MGD meibomian gland dysfunction; ATs artificial tears; PFAT's preservative free artificial tears; [place] nuclear sclerotic cataract; PSC posterior subcapsular cataract; ERM epi-retinal membrane; PVD posterior vitreous detachment; RD retinal detachment; DM diabetes mellitus; DR diabetic retinopathy; NPDR non-proliferative diabetic retinopathy; PDR proliferative diabetic retinopathy; CSME clinically significant macular edema; DME diabetic macular edema; dbh dot blot hemorrhages; CWS cotton wool spot; POAG primary open angle glaucoma; C/D cup-to-disc ratio; HVF humphrey visual field; GVF goldmann visual field; OCT optical coherence tomography; IOP intraocular pressure; BRVO Branch retinal vein occlusion; CRVO central retinal vein occlusion; CRAO central retinal artery occlusion; BRAO branch retinal artery occlusion; RT retinal tear; SB scleral buckle; PPV pars plana vitrectomy; VH Vitreous hemorrhage; PRP panretinal laser photocoagulation; IVK intravitreal kenalog; VMT vitreomacular traction; MH Macular hole;  NVD neovascularization of the disc; NVE neovascularization elsewhere; AREDS age related eye disease study; ARMD age related macular degeneration; POAG primary open angle glaucoma; EBMD epithelial/anterior basement membrane dystrophy; ACIOL anterior chamber intraocular lens; IOL intraocular lens; PCIOL posterior chamber intraocular lens; Phaco/IOL  phacoemulsification with intraocular lens placement; [place] photorefractive keratectomy; LASIK laser assisted in situ keratomileusis; HTN hypertension; DM diabetes mellitus; COPD chronic obstructive pulmonary disease

## 2017-05-07 ENCOUNTER — Ambulatory Visit (INDEPENDENT_AMBULATORY_CARE_PROVIDER_SITE_OTHER): Payer: Medicare Other | Admitting: Ophthalmology

## 2017-05-07 ENCOUNTER — Encounter (INDEPENDENT_AMBULATORY_CARE_PROVIDER_SITE_OTHER): Payer: Self-pay | Admitting: Ophthalmology

## 2017-05-07 DIAGNOSIS — H30002 Unspecified focal chorioretinal inflammation, left eye: Secondary | ICD-10-CM

## 2017-05-07 DIAGNOSIS — H2511 Age-related nuclear cataract, right eye: Secondary | ICD-10-CM

## 2017-05-07 DIAGNOSIS — Z961 Presence of intraocular lens: Secondary | ICD-10-CM

## 2017-05-07 DIAGNOSIS — H3581 Retinal edema: Secondary | ICD-10-CM | POA: Diagnosis not present

## 2017-05-07 DIAGNOSIS — H3092 Unspecified chorioretinal inflammation, left eye: Secondary | ICD-10-CM

## 2017-05-07 DIAGNOSIS — E113293 Type 2 diabetes mellitus with mild nonproliferative diabetic retinopathy without macular edema, bilateral: Secondary | ICD-10-CM | POA: Diagnosis not present

## 2017-05-07 LAB — PTH, INTACT AND CALCIUM
Calcium, Total (PTH): 8.9 mg/dL (ref 8.7–10.3)
PTH: 85 pg/mL — AB (ref 15–65)

## 2017-05-08 ENCOUNTER — Encounter (INDEPENDENT_AMBULATORY_CARE_PROVIDER_SITE_OTHER): Payer: Self-pay | Admitting: Ophthalmology

## 2017-05-14 DIAGNOSIS — E119 Type 2 diabetes mellitus without complications: Secondary | ICD-10-CM | POA: Diagnosis not present

## 2017-05-14 DIAGNOSIS — H2511 Age-related nuclear cataract, right eye: Secondary | ICD-10-CM | POA: Diagnosis not present

## 2017-05-14 DIAGNOSIS — E113293 Type 2 diabetes mellitus with mild nonproliferative diabetic retinopathy without macular edema, bilateral: Secondary | ICD-10-CM | POA: Diagnosis not present

## 2017-05-14 DIAGNOSIS — H25011 Cortical age-related cataract, right eye: Secondary | ICD-10-CM | POA: Diagnosis not present

## 2017-05-19 ENCOUNTER — Telehealth (INDEPENDENT_AMBULATORY_CARE_PROVIDER_SITE_OTHER): Payer: Self-pay

## 2017-05-19 ENCOUNTER — Other Ambulatory Visit: Payer: Self-pay | Admitting: Pulmonary Disease

## 2017-05-19 MED ORDER — VALACYCLOVIR HCL 500 MG PO TABS: 500.0000 mg | ORAL_TABLET | Freq: Three times a day (TID) | ORAL | 5 refills | Status: DC

## 2017-05-19 NOTE — Telephone Encounter (Signed)
Pt called needing refill on valtrex; Sent refill authorization in to [facility] on 04.29.19 @ 1:45 pm;   Catha Brow, [place]

## 2017-05-20 ENCOUNTER — Ambulatory Visit (HOSPITAL_COMMUNITY)
Admission: RE | Admit: 2017-05-20 | Discharge: 2017-05-20 | Disposition: A | Discharge status: Home / Self Care | Payer: Medicare Other | Source: Ambulatory Visit | Attending: Nephrology | Admitting: Nephrology

## 2017-05-20 VITALS — BP 167/79 | HR 70 | Temp 98.6°F | Resp 20

## 2017-05-20 DIAGNOSIS — D631 Anemia in chronic kidney disease: Secondary | ICD-10-CM | POA: Diagnosis not present

## 2017-05-20 DIAGNOSIS — N183 Chronic kidney disease, stage 3 unspecified: Secondary | ICD-10-CM

## 2017-05-20 LAB — POCT HEMOGLOBIN-HEMACUE: Hemoglobin: 9.7 g/dL — ABNORMAL LOW (ref 12.0–15.0)

## 2017-05-20 MED ORDER — EPOETIN ALFA 40000 UNIT/ML IJ SOLN: 30000.0000 [IU] | INTRAMUSCULAR | Status: DC

## 2017-05-20 MED ORDER — EPOETIN ALFA 10000 UNIT/ML IJ SOLN
INTRAMUSCULAR | Status: AC
  Administered 2017-05-20: 10000 [IU]
  Filled 2017-05-20: qty 1

## 2017-05-20 MED ORDER — EPOETIN ALFA 20000 UNIT/ML IJ SOLN
INTRAMUSCULAR | Status: AC
  Administered 2017-05-20: 20000 [IU]
  Filled 2017-05-20: qty 1

## 2017-06-03 ENCOUNTER — Encounter (HOSPITAL_COMMUNITY): Payer: Medicare Other

## 2017-06-03 ENCOUNTER — Ambulatory Visit (HOSPITAL_COMMUNITY)
Admission: RE | Admit: 2017-06-03 | Discharge: 2017-06-03 | Disposition: A | Discharge status: Home / Self Care | Payer: Medicare Other | Source: Ambulatory Visit | Attending: Nephrology | Admitting: Nephrology

## 2017-06-03 VITALS — BP 137/92 | HR 71 | Temp 98.0°F | Resp 20

## 2017-06-03 DIAGNOSIS — N183 Chronic kidney disease, stage 3 unspecified: Secondary | ICD-10-CM

## 2017-06-03 DIAGNOSIS — D631 Anemia in chronic kidney disease: Secondary | ICD-10-CM | POA: Insufficient documentation

## 2017-06-03 LAB — RENAL FUNCTION PANEL
ALBUMIN: 3.2 g/dL — AB (ref 3.5–5.0)
Anion gap: 9 (ref 5–15)
BUN: 19 mg/dL (ref 6–20)
CALCIUM: 9.2 mg/dL (ref 8.9–10.3)
CO2: 31 mmol/L (ref 22–32)
CREATININE: 1.53 mg/dL — AB (ref 0.44–1.00)
Chloride: 101 mmol/L (ref 101–111)
GFR, EST AFRICAN AMERICAN: 38 mL/min — AB (ref >60)
GFR, EST NON AFRICAN AMERICAN: 33 mL/min — AB (ref >60)
Glucose, Bld: 265 mg/dL — ABNORMAL HIGH (ref 65–99)
PHOSPHORUS: 3.1 mg/dL (ref 2.5–4.6)
Potassium: 3.9 mmol/L (ref 3.5–5.1)
Sodium: 141 mmol/L (ref 135–145)

## 2017-06-03 LAB — IRON AND TIBC
IRON: 140 ug/dL (ref 28–170)
Saturation Ratios: 67 % — ABNORMAL HIGH (ref 10.4–31.8)
TIBC: 210 ug/dL — ABNORMAL LOW (ref 250–450)
UIBC: 70 ug/dL

## 2017-06-03 LAB — FERRITIN: FERRITIN: 564 ng/mL — AB (ref 11–307)

## 2017-06-03 LAB — POCT HEMOGLOBIN-HEMACUE: HEMOGLOBIN: 11.1 g/dL — AB (ref 12.0–15.0)

## 2017-06-03 MED ORDER — EPOETIN ALFA 20000 UNIT/ML IJ SOLN
INTRAMUSCULAR | Status: AC
  Administered 2017-06-03: 20000 [IU] via SUBCUTANEOUS
  Filled 2017-06-03: qty 1

## 2017-06-03 MED ORDER — EPOETIN ALFA 10000 UNIT/ML IJ SOLN
INTRAMUSCULAR | Status: AC
  Administered 2017-06-03: 11:00:00 via SUBCUTANEOUS
  Filled 2017-06-03: qty 1

## 2017-06-03 MED ORDER — EPOETIN ALFA 40000 UNIT/ML IJ SOLN: 30000.0000 [IU] | INTRAMUSCULAR | Status: DC

## 2017-06-04 LAB — PTH, INTACT AND CALCIUM
Calcium, Total (PTH): 9.2 mg/dL (ref 8.7–10.3)
PTH: 103 pg/mL — ABNORMAL HIGH (ref 15–65)

## 2017-06-17 ENCOUNTER — Ambulatory Visit (HOSPITAL_COMMUNITY)
Admission: RE | Admit: 2017-06-17 | Discharge: 2017-06-17 | Disposition: A | Discharge status: Home / Self Care | Payer: Medicare Other | Source: Ambulatory Visit | Attending: Nephrology | Admitting: Nephrology

## 2017-06-17 VITALS — BP 161/79 | HR 60 | Temp 98.6°F | Resp 20

## 2017-06-17 DIAGNOSIS — N183 Chronic kidney disease, stage 3 unspecified: Secondary | ICD-10-CM

## 2017-06-17 LAB — POCT HEMOGLOBIN-HEMACUE: HEMOGLOBIN: 9.9 g/dL — AB (ref 12.0–15.0)

## 2017-06-17 MED ORDER — EPOETIN ALFA 10000 UNIT/ML IJ SOLN
INTRAMUSCULAR | Status: AC
  Administered 2017-06-17: 10000 [IU] via SUBCUTANEOUS
  Filled 2017-06-17: qty 1

## 2017-06-17 MED ORDER — EPOETIN ALFA 20000 UNIT/ML IJ SOLN
INTRAMUSCULAR | Status: AC
  Administered 2017-06-17: 20000 [IU] via SUBCUTANEOUS
  Filled 2017-06-17: qty 1

## 2017-06-17 MED ORDER — EPOETIN ALFA 40000 UNIT/ML IJ SOLN: 30000.0000 [IU] | INTRAMUSCULAR | Status: DC

## 2017-06-20 DIAGNOSIS — N183 Chronic kidney disease, stage 3 (moderate): Secondary | ICD-10-CM | POA: Diagnosis not present

## 2017-06-20 DIAGNOSIS — E889 Metabolic disorder, unspecified: Secondary | ICD-10-CM | POA: Diagnosis not present

## 2017-06-20 DIAGNOSIS — I129 Hypertensive chronic kidney disease with stage 1 through stage 4 chronic kidney disease, or unspecified chronic kidney disease: Secondary | ICD-10-CM | POA: Diagnosis not present

## 2017-06-20 DIAGNOSIS — D631 Anemia in chronic kidney disease: Secondary | ICD-10-CM | POA: Diagnosis not present

## 2017-06-20 DIAGNOSIS — M9080 Osteopathy in diseases classified elsewhere, unspecified site: Secondary | ICD-10-CM | POA: Diagnosis not present

## 2017-06-24 NOTE — Progress Notes (Addendum)
[facility] Note  06/25/2017     CHIEF COMPLAINT Patient presents for Retina Follow Up   HISTORY OF PRESENT ILLNESS: Joann Gutierrez is a 72 y.o. female who presents to the clinic today for:   HPI    Retina Follow Up    Patient presents with  Other.  In left eye.  Severity is moderate.  Duration of 7 weeks.  Since onset it is stable.  I, the attending physician,  performed the HPI with the patient and updated documentation appropriately.          Comments    F/U Aniographic CME / focal chorioretinal inflammation / posterior uveitis OS; Pt states OU VA is stable; Pt states she has a sharp pain OU off and on x 2 weeks; Pt states she notices the pain when she is watching television; Pt states when OU begin to hurt she uses PF OU and then "goes to sleep"; Pt states she is using PF OU BID; Pt denies floaters, denies flashes, denies wavy VA;        Last edited by Bernarda Caffey, MD on 06/25/2017 10:09 AM. (History)    Pt states she continues to have trouble reading; Reports she is taking gtts as directed;   Referring physician: Seward Carol, MD [address], [place] [address]  HISTORICAL INFORMATION:   Selected notes from the MEDICAL RECORD NUMBER Referral from Dr. Read Drivers for r/o angiographic CME Ocular Hx - HZO OS; Pseudophakia OS (07/10/16); cellophane OS; HTN; PMH - DM II on insulin; Hx stroke;    CURRENT MEDICATIONS: Current Outpatient Medications (Ophthalmic Drugs)  Medication Sig  . ketorolac (ACULAR) 0.5 % ophthalmic solution PLACE ONE DROP INTO THE LEFT EYE FOUR TIMES A DAY   Current Facility-Administered Medications (Ophthalmic Drugs)  Medication Route  . triamcinolone acetonide (TRIESENCE) 40 MG/ML subtenons injection 20 mg Subtenons   Current Outpatient Medications (Other)  Medication Sig  . aspirin EC 81 MG tablet Take 81 mg by mouth daily.  Marland Kitchen atorvastatin (LIPITOR) 40 MG tablet Take 40 mg by mouth daily.   Marland Kitchen azelastine (ASTELIN) 0.1 % nasal spray Place 2 sprays into both nostrils 2 (two) times daily. Use in each nostril as directed  . carvedilol (COREG) 12.5 MG tablet Take 12.5 mg by mouth 2 (two) times daily with a meal.  . doxazosin (CARDURA) 2 MG tablet Take 4 mg by mouth at bedtime.   Marland Kitchen ezetimibe (ZETIA) 10 MG tablet Take 10 mg by mouth at bedtime.   . fluticasone (FLONASE) 50 MCG/ACT nasal spray USE TWO SPRAYS INTO BOTH NOSTRILS DAILY  . fluticasone furoate-vilanterol (BREO ELLIPTA) 100-25 MCG/INH AEPB Inhale 1 puff into the lungs daily.  . furosemide (LASIX) 80 MG tablet Take 1 tablet (80 mg total) by mouth daily. (Patient taking differently: Take 80 mg by mouth 2 (two) times daily. )  . gabapentin (NEURONTIN) 300 MG capsule Take 1 capsule (300 mg total) by mouth 2 (two) times daily.  Marland Kitchen guaiFENesin (MUCINEX) 600 MG 12 hr tablet Take 2 tablets (1,200 mg total) by mouth 2 (two) times daily as needed for cough or to loosen phlegm.  . hydrALAZINE (APRESOLINE) 100 MG tablet Take 100 mg by mouth 3 (three) times daily.  . isosorbide mononitrate (IMDUR) 120 MG 24 hr tablet Take 1 tablet (120 mg total) by mouth daily. PATIENT NEEDS TO CONTACT OFFICE FOR ADDITIONAL REFILLS  . KLOR-CON M20 20 MEQ tablet Take 20 mEq by  mouth daily.   Marland Kitchen LEVEMIR FLEXTOUCH 100 UNIT/ML Pen Inject 15 Units as directed every morning.  Marland Kitchen losartan (COZAAR) 100 MG tablet Take 100 mg by mouth daily.  . metolazone (ZAROXOLYN) 2.5 MG tablet Take 2.5 mg by mouth daily.  . montelukast (SINGULAIR) 10 MG tablet TAKE ONE TABLET BY MOUTH ONCE DAILY  . nitroGLYCERIN (NITROSTAT) 0.4 MG SL tablet PLACE ONE TABLET UNDER THE TONGUE EVERY FIVE MINUTES AS NEEDED FOR CHEST PAIN  . omeprazole (PRILOSEC) 20 MG capsule Take 1 capsule (20 mg total) by mouth 2 (two) times daily before a meal.  . OXYGEN Inhale into the lungs. CPAP with oxygen at bedtime  . raloxifene (EVISTA) 60 MG tablet Take 60 mg by mouth every morning.   . sucralfate (CARAFATE) 1  g tablet Take 1 g by mouth 4 (four) times daily -  with meals and at bedtime.  . valACYclovir (VALTREX) 500 MG tablet Take 1 tablet (500 mg total) by mouth 3 (three) times daily.  . VENTOLIN HFA 108 (90 Base) MCG/ACT inhaler INHALE 2 PUFFS INTO THE LUNGS EVERY SIX HOURS AS NEEDED FOR SHORTNESSOF BREATH  . Vitamin D, Ergocalciferol, (DRISDOL) 50000 UNITS CAPS capsule Take 50,000 Units by mouth every Monday.    No current facility-administered medications for this visit.  (Other)      REVIEW OF SYSTEMS: ROS    Positive for: Musculoskeletal, Endocrine, Eyes, Respiratory, Heme/Lymph   Negative for: Constitutional, Gastrointestinal, Neurological, Skin, Genitourinary, HENT, Cardiovascular, Psychiatric, Allergic/Imm   Last edited by Cherrie Gauze, COA on 06/25/2017  8:08 AM. (History)       ALLERGIES Allergies  Allergen Reactions  . Morphine And Related Other (See Comments)    Family request not to be given, reports pt does not wake up when given   . Promethazine Hcl Other (See Comments)    REACTION: lethargy    PAST MEDICAL HISTORY Past Medical History:  Diagnosis Date  . Adenomatous colon polyp   . Allergy   . Anemia   . Asthma       . CAD (coronary artery disease)    Mild very minimal coronary disease with 20% obtuse marginal stenosis  . Carpal tunnel syndrome on left   . CHF (congestive heart failure) ([place])   . Chronic kidney disease (CKD), stage III (moderate) (HCC)   . COPD (chronic obstructive pulmonary disease) ([place])   . CVA (cerebral infarction)   . Depression   . 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 Diverticulosis   . 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 Esophageal dysmotility   . Esophageal stricture   . Gastritis   . Gastroparesis 08/21/2007  . GERD (gastroesophageal reflux disease)   . Hair loss   . Hearing loss of both ears   . Hernia, hiatal   . Hyperlipidemia   . Hypertension   . Morbid obesity ([place])   . Obesity   . Osteoarthritis    . OSTEOARTHRITIS 08/09/2006  . Osteoporosis   . 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 RENAL INSUFFICIENCY 02/16/2009  . 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 Seizures ([place])    pt thinks it has been several monthes since she had a seisure  . Shingles   . Sickle cell trait ([place])   . Stroke ([place])   . Tubular adenoma of colon    Past Surgical History:  Procedure Laterality Date  . ABDOMINAL HYSTERECTOMY    . ARTERY BIOPSY  01/07/2011   Procedure: MINOR  BIOPSY TEMPORAL ARTERY;  Surgeon: Haywood Lasso, MD;  Location: [facility];  Service: General;  Laterality: Left;  left temporal artery biopsy  . bil foot surgery    . BREAST LUMPECTOMY     benign  . BREAST LUMPECTOMY     both breast lumps removed   . CATARACT EXTRACTION Left 06/2016   Dr. Read Drivers  . COLONOSCOPY    . COLONOSCOPY WITH PROPOFOL N/A 07/05/2015   Procedure: COLONOSCOPY WITH PROPOFOL;  Surgeon: Manus Gunning, MD;  Location: [facility];  Service: Gastroenterology;  Laterality: N/A;  . ERD  08/08/2000  . ESOPHAGEAL MANOMETRY N/A 03/13/2015   Procedure: ESOPHAGEAL MANOMETRY (EM);  Surgeon: Manus Gunning, MD;  Location: [facility];  Service: Gastroenterology;  Laterality: N/A;  . ESOPHAGOGASTRODUODENOSCOPY  06/25/2006  . ESOPHAGOGASTRODUODENOSCOPY (EGD) WITH PROPOFOL N/A 07/05/2015   Procedure: ESOPHAGOGASTRODUODENOSCOPY (EGD) WITH PROPOFOL;  Surgeon: Manus Gunning, MD;  Location: [facility];  Service: Gastroenterology;  Laterality: N/A;  . HERNIA REPAIR    . LEFT AND RIGHT HEART CATHETERIZATION WITH CORONARY ANGIOGRAM N/A 03/03/2013   Procedure: LEFT AND RIGHT HEART CATHETERIZATION WITH CORONARY ANGIOGRAM;  Surgeon: Minus Breeding, MD;  Location[facility];  Service: Cardiovascular;  Laterality: N/A;  . REPLACEMENT TOTAL KNEE Left 1998  . UPPER GASTROINTESTINAL ENDOSCOPY      FAMILY HISTORY Family History  Problem Relation Age of Onset  . Liver cancer Mother         Liver Cancer  . Diabetes Mother   . Kidney disease Mother   . Heart disease Mother        age 86's  . Heart disease Father 45       MI  . Heart attack Father        died of MI when pt was 61  . Sickle cell anemia Father   . Colon cancer Brother   . Cancer Brother        Colon Cancer  . Diabetes Sister   . Kidney disease Sister   . Heart disease Sister        age 33's  . Allergies Sister   . Diabetes Sister   . Kidney disease Sister   . Heart disease Sister        age 19's  . Esophageal cancer Neg Hx   . Rectal cancer Neg Hx   . Stomach cancer Neg Hx   . Amblyopia Neg Hx   . Blindness Neg Hx   . Glaucoma Neg Hx   . Macular degeneration Neg Hx   . Retinal detachment Neg Hx   . Cataracts Neg Hx   . Strabismus Neg Hx   . Retinitis pigmentosa Neg Hx     SOCIAL HISTORY Social History   Tobacco Use  . Smoking status: Former Smoker    Packs/day: 0.50    Years: 10.00    Pack years: 5.00    Last attempt to quit: 04/18/1980    Years since quitting: 37.2  . Smokeless tobacco: Never Used  Substance Use Topics  . Alcohol use: No    Alcohol/week: 0.0 oz  . Drug use: No         OPHTHALMIC EXAM:  Base Eye Exam    Visual Acuity (Snellen - Linear)      Right Left   Dist cc 20/100 20/50   Dist ph cc 20/60 NI   Correction:  Glasses  Questionable effort       Tonometry (Tonopen, 8:22 AM)  Right Left   Pressure 15 13       Pupils      Dark Light Shape React APD   Right 3 2 Round Slow None   Left 3 2 Round Slow None       Visual Fields (Counting fingers)      Left Right    Full Full       Extraocular Movement      Right Left    Full, Ortho Full, Ortho       Neuro/Psych    Oriented x3:  Yes   Mood/Affect:  Normal       Dilation    Both eyes:  1.0% Mydriacyl, 2.5% Phenylephrine @ 8:22 AM        Slit Lamp and Fundus Exam    External Exam      Right Left   External Normal Normal       Slit Lamp Exam      Right Left   Lids/Lashes  Dermatochalasis - upper lid Dermatochalasis - upper lid   Conjunctiva/Sclera melanosis  White and quiet   Cornea Arcus well healed temporal insision   Anterior Chamber Moderate depth and quiet Deep and quiet, 0.5+ pigment   Iris Round and dilated; no NVI dilates to 4.25 mm; no NVI   Lens 2-3+ Nuclear sclerosis with mild brunescence, 2-3+ Cortical cataract, Vacuoles Posterior chamber intraocular lens in good position    Vitreous Vitreous syneresis Vitreous syneresis - mild; 1+ anterior pigment       Fundus Exam      Right Left   Disc Pink and Sharp Mild temporal Peripapillary atrophy/pigmentation   C/D Ratio 0.45 0.5   Macula Good foveal reflex, Flat; trace MA, Retinal pigment epithelial mottling Good foveal reflex, Trace ERM; trace MA; mild RPE changes;    Vessels Vascular attenuation; AV crossing changes, Copper wiring AV crossing changes; mild venous dilation, Copper wiring   Periphery Attached attached; SN blot hemes          IMAGING AND PROCEDURES  Imaging and Procedures for 05/08/17  OCT, Retina - OU - Both Eyes       Right Eye Quality was good. Central Foveal Thickness: 230. Progression has been stable. Findings include normal foveal contour, no IRF, no SRF.   Left Eye Quality was good. Central Foveal Thickness: 256. Progression has been stable. Findings include normal foveal contour, no SRF, no IRF.   Notes Images taken, stored on drive  Diagnosis / Impression:  No macular edema, IRF, SRF OU   Clinical management:  See below  Abbreviations: NFP - Normal foveal profile. CME - cystoid macular edema.  PED - pigment epithelial detachment. IRF - intraretinal fluid. SRF -  subretinal fluid. EZ - ellipsoid zone. ERM - epiretinal membrane. ORA -  outer retinal atrophy. ORT - outer retinal tubulation. SRHM - subretinal  hyper-reflective material        Fluorescein Angiography Optos (Transit OS)       Right Eye Progression has no prior data (Poor image quality,  dim images).   Left Eye Progression has improved. Early phase findings include microaneurysm. Mid/Late phase findings include leakage, microaneurysm.   Notes *Images stored on drive.  OD: low signal unable to interpret  OS: scattered MA with interval improvement in leakage -- trace leakage present                 ASSESSMENT/PLAN:    ICD-10-CM   1. 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 Retinal edema H35.81 OCT, Retina - OU - Both Eyes  4. Mild nonproliferative diabetic retinopathy of both eyes without macular edema associated with type 2 diabetes mellitus (HCC) F81.0175 Fluorescein Angiography Optos (Transit OS)  5. Pseudophakia, left eye Z96.1   6. Senile nuclear sclerosis, right H25.11     1,2,3. Angiographic CME / focal chorioretinal inflammation / Posterior uveitis OS -- resolved  - tr cystic changes on OCT, however prior FAs showed focal areas of late leakage from disc, macula consistent with chorioretinal inflammation  - etiology likely multifactorial with history of herpes zoster, s/p CE/PCIOL OS, and DM2  - currently on po valtrex (500 mg po TID), prednisolone acetate BID OS  - S/P Subtenon's Kenalog OS (11.28.18)  - pt's BCVA stable at 20/40  - repeat FA today, 6.5.19, shows interval improvement in angiographic leakage  - suspect most of FA abnormalities from DM2  - will cont tapering off PF -- dec PF qd OS x 1 week, then stop  - F/U 72months with repeat FA ([place] vs Optos, transit OS)   4. mild Non-Proliferative diabetic retinopathy, both eyes  - The incidence, risk factors for progression, natural history and treatment options for diabetic retinopathy  were discussed with patient.  The need for close monitoring of blood glucose, blood pressure, and serum lipids, avoiding cigarette or any type of tobacco, and the need for  long term follow up was also discussed with patient.  - as above, may have contributed to inflammatory picture OS  - repeat FAs 2.27.19, 4.17.19 suggest more of a leaking microaneurysmal picture that seems to be improving   - no frank DME  - monitor  5. Pseudophakia OS  - IOL in good position -- beautiful surgery by Dr. Kathlen Mody  - no frank CME on OCT, but mild leakage present on FA -- management as above  - will continue to monitor  6. Senile nuclear sclerosis OD  - The symptoms of cataract, surgical options, and treatments and risks were discussed with patient.  - discussed diagnosis and progression  - perhaps some mild progression OD  - under the expert care of Dr. Kathlen Mody   - management per Dr. Kathlen Mody  - from a retina standpoint, okay to proceed with CE/IOL OD when pt and surgeon are ready -- OS appears to be stable at this time    Ophthalmic Meds Ordered this visit:  No orders of the defined types were placed in this encounter.      Return in about 4 months (around 10/25/2017) for Dilated Exam, OCT.  There are no Patient Instructions on file for this visit.   Explained the diagnoses, plan, and follow up with the patient and they expressed understanding.  Patient expressed understanding of the importance of proper follow up care.   This document serves as a record of services personally performed by Gardiner Sleeper, MD, PhD. It was created on their behalf by Catha Brow, [place], a certified ophthalmic assistant. The creation of this record is the provider's dictation and/or activities during the visit.  Electronically signed by: Catha Brow, COA  06.04.19 10:20 AM   Gardiner Sleeper, M.D., Ph.D. Diseases & Surgery of the Retina and Vitreous [facility]  I have reviewed the above documentation for accuracy and completeness, and I agree with the above. Gardiner Sleeper, M.D., Ph.D. 06/25/17 10:22 AM  Abbreviations: M myopia (nearsighted); A  astigmatism; H hyperopia (farsighted); P presbyopia; Mrx spectacle prescription;  CTL contact lenses; OD right eye; OS left eye; OU both eyes  XT exotropia; ET esotropia; PEK punctate epithelial keratitis; PEE punctate epithelial erosions; DES dry eye syndrome; MGD meibomian gland dysfunction; ATs artificial tears; PFAT's preservative free artificial tears; [place] nuclear sclerotic cataract; PSC posterior subcapsular cataract; ERM epi-retinal membrane; PVD posterior vitreous detachment; RD retinal detachment; DM diabetes mellitus; DR diabetic retinopathy; NPDR non-proliferative diabetic retinopathy; PDR proliferative diabetic retinopathy; CSME clinically significant macular edema; DME diabetic macular edema; dbh dot blot hemorrhages; CWS cotton wool spot; POAG primary open angle glaucoma; C/D cup-to-disc ratio; HVF humphrey visual field; GVF goldmann visual field; OCT optical coherence tomography; IOP intraocular pressure; BRVO Branch retinal vein occlusion; CRVO central retinal vein occlusion; CRAO central retinal artery occlusion; BRAO branch retinal artery occlusion; RT retinal tear; SB scleral buckle; PPV pars plana vitrectomy; VH Vitreous hemorrhage; PRP panretinal laser photocoagulation; IVK intravitreal kenalog; VMT vitreomacular traction; MH Macular hole;  NVD neovascularization of the disc; NVE neovascularization elsewhere; AREDS age related eye disease study; ARMD age related macular degeneration; POAG primary open angle glaucoma; EBMD epithelial/anterior basement membrane dystrophy; ACIOL anterior chamber intraocular lens; IOL intraocular lens; PCIOL posterior chamber intraocular lens; Phaco/IOL phacoemulsification with intraocular lens placement; [place] photorefractive keratectomy; LASIK laser assisted in situ keratomileusis; HTN hypertension; DM diabetes mellitus; COPD chronic obstructive pulmonary disease

## 2017-06-25 ENCOUNTER — Encounter (INDEPENDENT_AMBULATORY_CARE_PROVIDER_SITE_OTHER): Payer: Self-pay | Admitting: Ophthalmology

## 2017-06-25 ENCOUNTER — Ambulatory Visit (INDEPENDENT_AMBULATORY_CARE_PROVIDER_SITE_OTHER): Payer: Medicare Other | Admitting: Ophthalmology

## 2017-06-25 DIAGNOSIS — H3092 Unspecified chorioretinal inflammation, left eye: Secondary | ICD-10-CM

## 2017-06-25 DIAGNOSIS — H2511 Age-related nuclear cataract, right eye: Secondary | ICD-10-CM | POA: Diagnosis not present

## 2017-06-25 DIAGNOSIS — H3581 Retinal edema: Secondary | ICD-10-CM

## 2017-06-25 DIAGNOSIS — Z961 Presence of intraocular lens: Secondary | ICD-10-CM | POA: Diagnosis not present

## 2017-06-25 DIAGNOSIS — E113293 Type 2 diabetes mellitus with mild nonproliferative diabetic retinopathy without macular edema, bilateral: Secondary | ICD-10-CM

## 2017-06-25 DIAGNOSIS — H30002 Unspecified focal chorioretinal inflammation, left eye: Secondary | ICD-10-CM

## 2017-06-25 NOTE — Progress Notes (Signed)
@Patient  ID: Joann Gutierrez, female    DOB: 1945-04-11, 72 y.o.   MRN: [id number]  Chief Complaint  Patient presents with  . 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    Recent [place] [facility] Encounters:  05/06/2017-office visit-[place] Patient having chest pain and tightness, productive cough with green mucus, wheezing and shortness of breath increased over the last 3 weeks Assessment and plan; COPD with asthma-continue Breo Singulair and as needed Ventolin, upper airway cough syndrome with postnasal drip-add Astelin and use Flonase on a regular basis for the next few weeks and continue Singulair, chronic respiratory failure continue 2 L of oxygen at night, obstructive sleep apnea- having difficulty using CPAP due to facial pain, continue to work on healthy weight  03/07/2017-office visit-TP Presenting for one-month follow-up today last office visit patient had an asthma exacerbation.  Patient remains on Dulera 2 puffs twice daily as well as controller mechanisms for AR Flonase and Singulair. Plan: Restart CPAP at bedtime, 1 month download, work on weight loss, follow-up with Dr. Melvyn Novas in 3 months,   Tests:   PSG 04/28/09 >> AHI 11, SpO2 low 68% Spirometry 07/05/10 >> FEV1 1.30 (64%), FEV1% 80 >> coughing during test ONO with CPAP and room air 10/22/12 >> Test time 10 hrs 59 min. Mean SpO2 92.4%, low SpO2 75%. Spent 16 min with SpO2 < 89%. PFT 03/17/13 >> FEV1 1.27 (67%), FEV1% 84, TLC 4.06 (80%), DLCO 57%, borderline BD   Imaging:  10/24/2016-chest x-ray-no evidence of acute cardiopulmonary abnormality 11/27/2015-CT maxillofacial without contrast- inflammatory changes centered in the soft tissues of the left nasal alla and left nasolabial fold extending superior  to left periorbital soft tissues.  Mild sphenoid and maxillary sinus mucosal thickening  Cardiac:  Echo 02/04/13 >> mod LVH, EF 65 to 16%, grade 1 diastolic dysfx, PAS 42 mmHg Echo 05/21/11 >> EF 55 to 60%, mild LVH, mild/mod TR, PAS 45 mmHg Lt, Rt Heart cath 03/03/13 >> RA 10, RV 38/7, PA 41/16 28, PCWP 13, LV 142/5, AO 137/76  Labs:  RAST 12/11/12 >> negative, IgE 63.3  Micro:   Chart Review:      06/26/17  Acute   Patient presenting for follow-up appointment today with similar symptoms from April/2019 appointment.  Patient saying that she feels like sometimes her cough is due to her GERD and does admit that sometimes she forgets to take her omeprazole as well as her Breo.  Patient still reporting occasional yellow-green mucus with cough.  Patient says that cough is more at night.  Patient has not used over-the-counter cough medicine.  Patient continuing to use 2 L of oxygen at night as prescribed.  Sometimes patient feels that she needs to put her oxygen on during the day because she is short of breath.  She notices that this sometimes happens whenever she forgets to take her Breo.  Patient also reporting occasional nausea, vomiting, diarrhea, constipation.  Patient has not followed up with primary care since June of last year.  Patient says that she does not feel she has the need to follow-up with them.   Allergies  Allergen Reactions  . Morphine And Related Other (See Comments)    Family request not to be given, reports pt does not wake up when  given   . Promethazine Hcl Other (See Comments)    REACTION: lethargy    Immunization History  Administered Date(s) Administered  . Influenza Split 10/16/2010, 11/10/2012  . Influenza Whole 01/02/2009, 11/02/2009, 10/31/2011  . Influenza,inj,Quad PF,6+ Mos 09/24/2013, 10/16/2015  . Influenza-Unspecified 11/21/2016  . Pneumococcal Conjugate-13 10/16/2015  . Pneumococcal Polysaccharide-23 10/22/2002, 10/21/2009, 09/12/2013  . Td  08/22/2002    Past Medical History:  Diagnosis Date  . Adenomatous colon polyp   . Allergy   . Anemia   . Asthma       . CAD (coronary artery disease)    Mild very minimal coronary disease with 20% obtuse marginal stenosis  . Carpal tunnel syndrome on left   . CHF (congestive heart failure) ([place])   . Chronic kidney disease (CKD), stage III (moderate) (HCC)   . COPD (chronic obstructive pulmonary disease) ([place])   . CVA (cerebral infarction)   . Depression   . 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 Diverticulosis   . 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 Esophageal dysmotility   . Esophageal stricture   . Gastritis   . Gastroparesis 08/21/2007  . GERD (gastroesophageal reflux disease)   . Hair loss   . Hearing loss of both ears   . Hernia, hiatal   . Hyperlipidemia   . Hypertension   . Morbid obesity ([place])   . Obesity   . Osteoarthritis   . OSTEOARTHRITIS 08/09/2006  . Osteoporosis   . 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 RENAL INSUFFICIENCY 02/16/2009  . 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 Seizures ([place])    pt thinks it has been several monthes since she had a seisure  . Shingles   . Sickle cell trait ([place])   . Stroke ([place])   . Tubular adenoma of colon     Tobacco History: Social History   Tobacco Use  Smoking Status Former Smoker  . Packs/day: 0.50  . Years: 10.00  . Pack years: 5.00  . Last attempt to quit: 04/18/1980  . Years since quitting: 37.2  Smokeless Tobacco Never Used   Counseling given: Yes Continue not smoking.  Outpatient Encounter Medications as of 06/26/2017  Medication Sig  . aspirin EC 81 MG tablet Take 81 mg by mouth daily.  Marland Kitchen atorvastatin (LIPITOR) 40 MG tablet Take 40 mg by mouth daily.  Marland Kitchen azelastine (ASTELIN) 0.1 % nasal spray Place 2 sprays into both nostrils 2 (two) times daily. Use in each nostril as directed  . carvedilol (COREG) 12.5 MG tablet Take 12.5 mg by mouth 2 (two) times daily with a meal.  .  doxazosin (CARDURA) 2 MG tablet Take 4 mg by mouth at bedtime.   Marland Kitchen ezetimibe (ZETIA) 10 MG tablet Take 10 mg by mouth at bedtime.   . fluticasone (FLONASE) 50 MCG/ACT nasal spray USE TWO SPRAYS INTO BOTH NOSTRILS DAILY  . fluticasone furoate-vilanterol (BREO ELLIPTA) 100-25 MCG/INH AEPB Inhale 1 puff into the lungs daily.  . furosemide (LASIX) 80 MG tablet Take 1 tablet (80 mg total) by mouth daily. (Patient taking differently: Take 80 mg by mouth 2 (two) times daily. )  . gabapentin (NEURONTIN) 300 MG capsule Take 1 capsule (300 mg total) by mouth 2 (two) times daily.  Marland Kitchen guaiFENesin (MUCINEX) 600 MG 12 hr tablet Take 2 tablets (1,200 mg total) by mouth 2 (two) times daily as needed for cough or to loosen phlegm.  . hydrALAZINE (APRESOLINE) 100 MG tablet Take 100 mg by  mouth 3 (three) times daily.  . isosorbide mononitrate (IMDUR) 120 MG 24 hr tablet Take 1 tablet (120 mg total) by mouth daily. PATIENT NEEDS TO CONTACT OFFICE FOR ADDITIONAL REFILLS  . ketorolac (ACULAR) 0.5 % ophthalmic solution PLACE ONE DROP INTO THE LEFT EYE FOUR TIMES A DAY  . KLOR-CON M20 20 MEQ tablet Take 20 mEq by mouth daily.   Marland Kitchen LEVEMIR FLEXTOUCH 100 UNIT/ML Pen Inject 15 Units as directed every morning.  Marland Kitchen losartan (COZAAR) 100 MG tablet Take 100 mg by mouth daily.  . metolazone (ZAROXOLYN) 2.5 MG tablet Take 2.5 mg by mouth daily.  . montelukast (SINGULAIR) 10 MG tablet TAKE ONE TABLET BY MOUTH ONCE DAILY  . nitroGLYCERIN (NITROSTAT) 0.4 MG SL tablet PLACE ONE TABLET UNDER THE TONGUE EVERY FIVE MINUTES AS NEEDED FOR CHEST PAIN  . OXYGEN Inhale into the lungs. CPAP with oxygen at bedtime  . raloxifene (EVISTA) 60 MG tablet Take 60 mg by mouth every morning.   . sucralfate (CARAFATE) 1 g tablet Take 1 g by mouth 4 (four) times daily -  with meals and at bedtime.  . valACYclovir (VALTREX) 500 MG tablet Take 1 tablet (500 mg total) by mouth 3 (three) times daily.  . VENTOLIN HFA 108 (90 Base) MCG/ACT inhaler INHALE 2  PUFFS INTO THE LUNGS EVERY SIX HOURS AS NEEDED FOR SHORTNESSOF BREATH  . Vitamin D, Ergocalciferol, (DRISDOL) 50000 UNITS CAPS capsule Take 50,000 Units by mouth every Monday.   . [DISCONTINUED] azelastine (ASTELIN) 0.1 % nasal spray Place 2 sprays into both nostrils 2 (two) times daily. Use in each nostril as directed  . omeprazole (PRILOSEC) 20 MG capsule Take 1 capsule (20 mg total) by mouth 2 (two) times daily before a meal.   Facility-Administered Encounter Medications as of 06/26/2017  Medication  . triamcinolone acetonide (TRIESENCE) 40 MG/ML subtenons injection 20 mg   Medication adherence-emphasized the importance of medication adherence to the patient, reviewed how she supposed to take her inhalers, and maintaining her allergy and GERD symptoms.  Review of Systems  Constitutional: +fatigue  No  weight loss, night sweats,  fevers, chills HEENT: +HA, nasal congestion, occasional ST  No Difficulty swallowing,  Tooth/dental problems, No sneezing, itching, ear ache CV: +chronic leg swelling No chest pain,  orthopnea, PND, anasarca, dizziness, palpitations, syncope  GI: +nausea and vomiting on empty stomach, heart burn, diarrhea, changes in bowel, loss of appetite with diet  No bloody stools Resp: +cough, mucous - yellow and green, increased sob with exertion and rest, No coughing up of blood.  No change in color of mucus.  No wheezing.  No chest wall deformity Skin: no rash, lesions, no skin changes. GU: no dysuria, change in color of urine, no urgency or frequency.  No flank pain, no hematuria  MS:  No joint pain or swelling.  No decreased range of motion.  No back pain. Psych:  No change in mood or affect. No depression or anxiety.  No memory loss.  Of note, patient having difficulty differentiating between acute symptoms as well as chronic symptoms that have been managed in the past.  Patient reporting majority of her symptoms have occurred for over 6 months.  Patient has not followed up  with primary care or contact of them.   Physical Exam  BP 130/88 (BP Location: Left Arm, Cuff Size: Normal)   Pulse 87   Ht 5' 4.5" (1.638 m)   Wt 257 lb 6.4 oz (116.8 kg)   SpO2 94%  BMI 43.50 kg/m   Wt Readings from Last 3 Encounters:  06/26/17 257 lb 6.4 oz (116.8 kg)  05/06/17 262 lb (118.8 kg)  03/07/17 259 lb 12.8 oz (117.8 kg)     GEN: A/Ox3; pleasant , NAD, well nourished, chronically ill     HEENT:  [place]/AT,  EACs-clear, TMs-with effusion bilaterally, without infection, NOSE- erythematous , THROAT-clear, mallampati III, +post nasal drip, no lesions, Sinus - not tender to palpation on exam - pt just reports chronic trigeminal nerve pain   NECK:  Supple w/ fair ROM; no JVD; normal carotid impulses w/o bruits; no thyromegaly or nodules palpated; no lymphadenopathy.   RESP:  +diminised in bases, no wheeze, air movement in all lobes no accessory muscle use, no dullness to percussion  CARD:  +1+ LE edema bilaterally, RRR, no m/r/g, no peripheral edema, pulses intact, no cyanosis or clubbing.  GI:   Soft & nt; nml bowel sounds; no organomegaly or masses detected.   Musco: Warm bil, no deformities or joint swelling noted.   Neuro: alert, no focal deficits noted, +occasionally confused during exam and ROS about plan of care and health management     Skin: Warm, no lesions or rashes, +patient tender to any palpation during exam    Lab Results:  CBC    Component Value Date/Time   WBC 7.7 12/14/2015 1325   RBC 4.95 12/14/2015 1325   HGB 9.9 (L) 06/17/2017 1040   HCT 36.5 12/14/2015 1325   PLT 381 12/14/2015 1325   MCV 73.7 (L) 12/14/2015 1325   MCH 23.4 (L) 12/14/2015 1325   MCHC 31.8 12/14/2015 1325   RDW 16.4 (H) 12/14/2015 1325   LYMPHSABS 1.3 12/14/2015 1325   MONOABS 1.0 12/14/2015 1325   EOSABS 0.0 12/14/2015 1325   BASOSABS 0.0 12/14/2015 1325    BMET    Component Value Date/Time   NA 141 06/03/2017 1047   K 3.9 06/03/2017 1047   CL 101 06/03/2017  1047   CO2 31 06/03/2017 1047   GLUCOSE 265 (H) 06/03/2017 1047   BUN 19 06/03/2017 1047   CREATININE 1.53 (H) 06/03/2017 1047   CREATININE 1.36 (H) 07/24/2011 0925   CALCIUM 9.2 06/03/2017 1047   CALCIUM 9.2 06/03/2017 1047   GFRNONAA 33 (L) 06/03/2017 1047   GFRAA 38 (L) 06/03/2017 1047    BNP    Component Value Date/Time   BNP 119.4 (H) 05/19/2015 0048    ProBNP    Component Value Date/Time   PROBNP 65.0 05/20/2014 1620    Imaging: No results found.   Assessment & Plan:   72 year old pleasant patient.  Patient needs help reestablishing with primary care.  Patient wanting our specialty to manage her nausea, vomiting, chronic pain, chronic comorbidities etc. patient also had not started the interventions discussed at April appointment with Dr. Halford Chessman.  Order placed again for Astelin nasal spray which I think will help with the postnasal drip as well as ear fullness.  She agrees.  Patient can use over-the-counter cough medicine to help with cough as of right now.  Patient educated that she can get an SPO2 monitor from her pharmacy to monitor her oxygen levels at home.  Patient reports occasionally forgetting to take [facility] and educated patient that she needs to make sure she is using this as prescribed.  Will place referral for new PCP.  Reiterated extensively with patient that when she is having acute issues she needs to be following up with primary care.  And that primary care  will schedule follow-ups as they deem appropriately but they are there to treat and manage her acute issues in the meantime when they are present.   Patient to follow back up with Dr. Melvyn Novas in 3 months.  Obstructive sleep apnea Still intolerant of CPAP due to a trigeminal nerve pain  Chronic respiratory failure Continue 2 L of oxygen at night Patient can purchase SPO2 monitor to monitor oxygen saturations at home goal oxygen saturations greater than 90%  Chronic obstructive asthma (HCC) Continue Breo  and inhalers as prescribed Continue using rescue inhalers   Morbid obesity due to excess calories ([place]) Continue to work towards healthy weight  Ineffective health maintenance Referral for new primary care provider placed today Emphasized and discussed with patient extensively importance of having close follow-up with primary care  GERD (gastroesophageal reflux disease) Continue omeprazole 20mg   Daily   Medication management Medication adherence-emphasized the importance of medication adherence to the patient, reviewed how she supposed to take her inhalers, and maintaining her allergy and GERD symptoms.  Referral to primary care      Lauraine Rinne, NP 06/26/2017

## 2017-06-26 ENCOUNTER — Encounter (INDEPENDENT_AMBULATORY_CARE_PROVIDER_SITE_OTHER): Payer: Self-pay | Admitting: Ophthalmology

## 2017-06-26 ENCOUNTER — Encounter: Payer: Self-pay | Admitting: Pulmonary Disease

## 2017-06-26 ENCOUNTER — Ambulatory Visit (INDEPENDENT_AMBULATORY_CARE_PROVIDER_SITE_OTHER): Payer: Medicare Other | Admitting: Pulmonary Disease

## 2017-06-26 VITALS — BP 130/88 | HR 87 | Ht 64.5 in | Wt 257.4 lb

## 2017-06-26 DIAGNOSIS — G4733 Obstructive sleep apnea (adult) (pediatric): Secondary | ICD-10-CM

## 2017-06-26 DIAGNOSIS — J449 Chronic obstructive pulmonary disease, unspecified: Secondary | ICD-10-CM

## 2017-06-26 DIAGNOSIS — K219 Gastro-esophageal reflux disease without esophagitis: Secondary | ICD-10-CM | POA: Diagnosis not present

## 2017-06-26 DIAGNOSIS — Z789 Other specified health status: Secondary | ICD-10-CM | POA: Diagnosis not present

## 2017-06-26 DIAGNOSIS — Z79899 Other long term (current) drug therapy: Secondary | ICD-10-CM | POA: Diagnosis not present

## 2017-06-26 DIAGNOSIS — J301 Allergic rhinitis due to pollen: Secondary | ICD-10-CM

## 2017-06-26 DIAGNOSIS — J9611 Chronic respiratory failure with hypoxia: Secondary | ICD-10-CM | POA: Diagnosis not present

## 2017-06-26 MED ORDER — AZELASTINE HCL 0.1 % NA SOLN: 2.0000 | Freq: Two times a day (BID) | NASAL | 12 refills | Status: DC

## 2017-06-26 NOTE — Assessment & Plan Note (Signed)
Continue to work towards healthy weight 

## 2017-06-26 NOTE — Assessment & Plan Note (Signed)
Continue omeprazole 20mg   Daily

## 2017-06-26 NOTE — Assessment & Plan Note (Signed)
Referral for new primary care provider placed today Emphasized and discussed with patient extensively importance of having close follow-up with primary care

## 2017-06-26 NOTE — Assessment & Plan Note (Signed)
Continue 2 L of oxygen at night Patient can purchase SPO2 monitor to monitor oxygen saturations at home goal oxygen saturations greater than 90%

## 2017-06-26 NOTE — Progress Notes (Signed)
Chart and office note reviewed in detail  > agree with a/p as outlined    

## 2017-06-26 NOTE — Assessment & Plan Note (Signed)
Medication adherence-emphasized the importance of medication adherence to the patient, reviewed how she supposed to take her inhalers, and maintaining her allergy and GERD symptoms.  Referral to primary care

## 2017-06-26 NOTE — Assessment & Plan Note (Signed)
Still intolerant of CPAP due to a trigeminal nerve pain

## 2017-06-26 NOTE — Patient Instructions (Addendum)
Referral for primary care placed today >>>Emphasized importance with patient to follow-up with primary care consistently regarding multitude of symptoms  >>> Encourage patient to follow-up with [name] in the meantime if they continue to have nausea and diarrhea.  >>>>>> patient can use brat diet in the meantime see information below  Can use nasal saline spray as needed Referral replaced for Astelin spray as patient can use this to help with sinus drainage as well as ear fullness Continue Flonase nasal spray  Can purchase SPO2 monitor from pharmacy to monitor oxygen levels >>> Maintain sats greater than 90%  Follow-up in 3 months to see Dr. Melvyn Novas     Please contact the office if your symptoms worsen or you have concerns that you are not improving.   Thank you for choosing [facility] for your healthcare, and for allowing [place] to partner with you on your healthcare journey. I am thankful to be able to provide care to you today.   Wyn Quaker FNP-C      Criss Rosales Diet / Kae Heller diet A bland diet consists of foods that do not have a lot of fat or fiber. Foods without fat or fiber are easier for the body to digest. They are also less likely to irritate your mouth, throat, stomach, and other parts of your gastrointestinal tract. A bland diet is sometimes called a BRAT diet. What is my plan? Your health care provider or dietitian may recommend specific changes to your diet to prevent and treat your symptoms, such as:  Eating small meals often.  Cooking food until it is soft enough to chew easily.  Chewing your food well.  Drinking fluids slowly.  Not eating foods that are very spicy, sour, or fatty.  Not eating citrus fruits, such as oranges and grapefruit.  What do I need to know about this diet?  Eat a variety of foods from the bland diet food list.  Do not follow a bland diet longer than you have to.  Ask your health care provider whether you should take  vitamins. What foods can I eat? Grains  Hot cereals, such as cream of wheat. Bread, crackers, or tortillas made from refined white flour. Rice. Vegetables Canned or cooked vegetables. Mashed or boiled potatoes. Fruits Bananas. Applesauce. Other types of cooked or canned fruit with the skin and seeds removed, such as canned peaches or pears. Meats and Other Protein Sources Scrambled eggs. Creamy peanut butter or other nut butters. Lean, well-cooked meats, such as chicken or fish. Tofu. Soups or broths. Dairy Low-fat dairy products, such as milk, cottage cheese, or yogurt. Beverages Water. Herbal tea. Apple juice. Sweets and Desserts Pudding. Custard. Fruit gelatin. Ice cream. Fats and Oils Mild salad dressings. Canola or olive oil. The items listed above may not be a complete list of allowed foods or beverages. Contact your dietitian for more options. What foods are not recommended? Foods and ingredients that are often not recommended include:  Spicy foods, such as hot sauce or salsa.  Fried foods.  Sour foods, such as pickled or fermented foods.  Raw vegetables or fruits, especially citrus or berries.  Caffeinated drinks.  Alcohol.  Strongly flavored seasonings or condiments.  The items listed above may not be a complete list of foods and beverages that are not allowed. Contact your dietitian for more information. This information is not intended to replace advice given to you by your health care provider. Make sure you discuss any questions you have with your health  care provider. Document Released: 05/01/2015 Document Revised: 06/15/2015 Document Reviewed: 01/19/2014 Elsevier Interactive Patient Education  2018 [employer].

## 2017-06-26 NOTE — Addendum Note (Signed)
Addended by: Shirlee More R on: 06/26/2017 12:55 PM   Modules accepted: Orders

## 2017-06-26 NOTE — Assessment & Plan Note (Signed)
Continue Breo and inhalers as prescribed Continue using rescue inhalers

## 2017-06-27 ENCOUNTER — Telehealth: Payer: Self-pay | Admitting: Pulmonary Disease

## 2017-06-27 ENCOUNTER — Other Ambulatory Visit: Payer: Self-pay

## 2017-06-27 NOTE — Telephone Encounter (Signed)
Spoke to [employer] at [facility].  Referral/records were faxed to medical records dept & their fax is down.  She states to fax to [phone] & records will come straight to her & she will get pt scheduled today.  Referral/records faxed.  Nothing further needed.

## 2017-06-27 NOTE — Patient Outreach (Addendum)
[name]) Care Management  06/27/2017  Joann Gutierrez Aug 16, 1945 [id number]   Telephone Screen  Referral Date: 06/26/17 Referral Source: MD office-[facility] Referral Reason: " medication management, Asthma Insurance: Medicare   Outreach attempt # 1 to patient. Spoke with patient and screening completed. Patient alert and oriented but with slow responses to questions and not a good historian.   Social: She voices that she resides in the home with her 'friend" Joann Gutierrez. She report that he helps her out a lot. patient states that she requires assistance with ADLs/IADLs. She voices that her friend normally does the cooking,cleaning and helps with personal care. Patient reports that she fell last night. She denies any injuries. DME in the home include walker, oxygen, BSC, cane, scale and cbg meter. She uses [facility] to get to medical appts.    Conditions: Per chart review, patient has PMH of Asthma, remote smoker, OSA, CAD, CVA, CHF, CKD stage 3, DM, GERD, obesity, HTN and OA. RN CM assessed how patient was managing all of her co-morbidities and she repeatedly kept stating "I need help.I need help." Patient reports that she has a cbg meter but "it doesn't work all the time." She has not checked cbg this morning. She reports she tries to check cbg 2x/day. She thinks cbg yesterday was 136 prior to eating. She reports she does have issues with hypo/hyperglycemia at times. Patient states she does not have BP monitor in the home and does not know what her BP normally runs. She did confirm that she has a scale and weighs often. However, she states that her weights are always up and down. Patient states that she is having ongoing and worsening issues managing her breathing. She reports that she is more SOB than normal. She has oxygen that she normally only needs to use at bedtime but lately has been having to use it during the day to get some relief of SOB.    Medications:  Patient states that her friend is filling med planner for her. Patient reports that she has "22 pill bottles" as well as she takes several OTC meds as well. She reports that she does have trouble affording meds at times due to her limited income.    Appointments: Patient is followed closely by [facility]. She reports that she saw PCP(Dr. Polite) last year. She told MD during office visit yesterday that she wants to switch to a new PCP and office is working on getting her with new PCP. Per notes office has sent medical records to [facility] (part of [facility] network).   Advance Directives: None. Declined.    Consent: [facility] services reviewed and discussed [facility] services. Patient gave verbal consent for services.   Plan: RN CM will send [place] nurse referral for further in home eval/assessment of care needs and mgmt of chronic conditions and symptoms. RN CM will send [place] referral for polypharmacy med review and possible med assistance.    Enzo Montgomery, RN,BSN,CCM [place] [facility] Telephonic Care Management Coordinator Direct Phone: [phone] Toll Free: [phone] Fax: [phone]

## 2017-07-01 ENCOUNTER — Other Ambulatory Visit: Payer: Self-pay | Admitting: Pharmacist

## 2017-07-01 ENCOUNTER — Encounter (HOSPITAL_COMMUNITY)
Admission: RE | Admit: 2017-07-01 | Discharge: 2017-07-01 | Disposition: A | Discharge status: Home / Self Care | Payer: Medicare Other | Source: Ambulatory Visit | Attending: Nephrology | Admitting: Nephrology

## 2017-07-01 VITALS — BP 171/78 | HR 73 | Temp 98.1°F | Resp 20

## 2017-07-01 DIAGNOSIS — N183 Chronic kidney disease, stage 3 unspecified: Secondary | ICD-10-CM

## 2017-07-01 DIAGNOSIS — D631 Anemia in chronic kidney disease: Secondary | ICD-10-CM | POA: Insufficient documentation

## 2017-07-01 LAB — RENAL FUNCTION PANEL
Albumin: 3 g/dL — ABNORMAL LOW (ref 3.5–5.0)
Anion gap: 6 (ref 5–15)
BUN: 36 mg/dL — AB (ref 6–20)
CHLORIDE: 106 mmol/L (ref 101–111)
CO2: 28 mmol/L (ref 22–32)
Calcium: 8.8 mg/dL — ABNORMAL LOW (ref 8.9–10.3)
Creatinine, Ser: 1.85 mg/dL — ABNORMAL HIGH (ref 0.44–1.00)
GFR calc Af Amer: 30 mL/min — ABNORMAL LOW (ref >60)
GFR calc non Af Amer: 26 mL/min — ABNORMAL LOW (ref >60)
GLUCOSE: 391 mg/dL — AB (ref 65–99)
POTASSIUM: 4.3 mmol/L (ref 3.5–5.1)
Phosphorus: 4.1 mg/dL (ref 2.5–4.6)
Sodium: 140 mmol/L (ref 135–145)

## 2017-07-01 LAB — FERRITIN: Ferritin: 534 ng/mL — ABNORMAL HIGH (ref 11–307)

## 2017-07-01 LAB — IRON AND TIBC
IRON: 106 ug/dL (ref 28–170)
SATURATION RATIOS: 53 % — AB (ref 10.4–31.8)
TIBC: 200 ug/dL — AB (ref 250–450)
UIBC: 94 ug/dL

## 2017-07-01 MED ORDER — EPOETIN ALFA 40000 UNIT/ML IJ SOLN: 30000.0000 [IU] | INTRAMUSCULAR | Status: DC

## 2017-07-01 MED ORDER — EPOETIN ALFA 10000 UNIT/ML IJ SOLN
INTRAMUSCULAR | Status: AC
  Administered 2017-07-01: 10000 [IU] via SUBCUTANEOUS
  Filled 2017-07-01: qty 1

## 2017-07-01 MED ORDER — EPOETIN ALFA 20000 UNIT/ML IJ SOLN
INTRAMUSCULAR | Status: AC
  Administered 2017-07-01: 20000 [IU] via SUBCUTANEOUS
  Filled 2017-07-01: qty 1

## 2017-07-01 NOTE — Patient Outreach (Signed)
[place] [facility]) Care Management  07/01/2017  LILIE VEZINA 1945-06-17 [id number]   72 y.o. year old female referred to [place] for Medication Management (Polypharmacy, medication adherence) and Medication Assistance Referral from [place], Leonardo RN for polypharmacy medication review and difficulty affording medications.  Patient has Medicare Part D coverage through [employer] ([employer]).  Was unable to reach patient via telephone today and have left HIPAA compliant voicemail asking patient to return my call. (unsuccessful outreach #1).  Plan: Will follow-up within 2-4  business days via telephone.  Will send unsuccessful outreach letter and allow patient 10 days to respond prior to closing case.   [name], PharmD Clinical Pharmacist, [place] [facility] [phone]

## 2017-07-02 ENCOUNTER — Encounter: Payer: Self-pay | Admitting: *Deleted

## 2017-07-02 ENCOUNTER — Other Ambulatory Visit: Payer: Self-pay | Admitting: *Deleted

## 2017-07-02 LAB — PTH, INTACT AND CALCIUM
Calcium, Total (PTH): 8.6 mg/dL — ABNORMAL LOW (ref 8.7–10.3)
PTH: 154 pg/mL — ABNORMAL HIGH (ref 15–65)

## 2017-07-02 LAB — POCT HEMOGLOBIN-HEMACUE: Hemoglobin: 10.8 g/dL — ABNORMAL LOW (ref 12.0–15.0)

## 2017-07-02 NOTE — Patient Outreach (Signed)
[place] [facility]) Care Management  07/02/2017  Joann Gutierrez 01-06-1946 [id number]    Telephone Assessment referred 6/10  RN initial outreach attempt unsuccessful but RN able to leave a HIPAA approved voice message requesting a call back. Will further engage at that time and rescheduled another outreach call to reach this pt.  Raina Mina, RN Care Management Coordinator [place] Office [phone]

## 2017-07-04 ENCOUNTER — Ambulatory Visit: Payer: Self-pay | Admitting: Pharmacist

## 2017-07-04 DIAGNOSIS — Z794 Long term (current) use of insulin: Secondary | ICD-10-CM | POA: Diagnosis not present

## 2017-07-04 DIAGNOSIS — Z23 Encounter for immunization: Secondary | ICD-10-CM | POA: Diagnosis not present

## 2017-07-04 DIAGNOSIS — Z7189 Other specified counseling: Secondary | ICD-10-CM | POA: Diagnosis not present

## 2017-07-04 DIAGNOSIS — D649 Anemia, unspecified: Secondary | ICD-10-CM | POA: Diagnosis not present

## 2017-07-04 DIAGNOSIS — E1121 Type 2 diabetes mellitus with diabetic nephropathy: Secondary | ICD-10-CM | POA: Diagnosis not present

## 2017-07-04 DIAGNOSIS — N183 Chronic kidney disease, stage 3 (moderate): Secondary | ICD-10-CM | POA: Diagnosis not present

## 2017-07-04 DIAGNOSIS — I10 Essential (primary) hypertension: Secondary | ICD-10-CM | POA: Diagnosis not present

## 2017-07-04 DIAGNOSIS — Z1389 Encounter for screening for other disorder: Secondary | ICD-10-CM | POA: Diagnosis not present

## 2017-07-04 DIAGNOSIS — I2510 Atherosclerotic heart disease of native coronary artery without angina pectoris: Secondary | ICD-10-CM | POA: Diagnosis not present

## 2017-07-04 DIAGNOSIS — Z0000 Encounter for general adult medical examination without abnormal findings: Secondary | ICD-10-CM | POA: Diagnosis not present

## 2017-07-04 DIAGNOSIS — G4730 Sleep apnea, unspecified: Secondary | ICD-10-CM | POA: Diagnosis not present

## 2017-07-04 DIAGNOSIS — E7800 Pure hypercholesterolemia, unspecified: Secondary | ICD-10-CM | POA: Diagnosis not present

## 2017-07-04 DIAGNOSIS — R413 Other amnesia: Secondary | ICD-10-CM | POA: Diagnosis not present

## 2017-07-07 ENCOUNTER — Telehealth: Payer: Self-pay | Admitting: Internal Medicine

## 2017-07-07 ENCOUNTER — Other Ambulatory Visit: Payer: Self-pay | Admitting: Pharmacist

## 2017-07-07 ENCOUNTER — Other Ambulatory Visit: Payer: Self-pay | Admitting: *Deleted

## 2017-07-07 NOTE — Telephone Encounter (Signed)
I will send the order to another office

## 2017-07-07 NOTE — Patient Outreach (Signed)
[place] [facility]) Care Management  07/07/2017  Joann Gutierrez 15-May-1945 [id number]   72 y.o. year old female referred to [place] for Medication Management (Polypharmacy, medication adherence) and Medication Assistance Referral from [place], [place] RN for polypharmacy medication review and difficulty affording medications.  Patient has Medicare Part D coverage through [employer] ([employer]).  Spoke with patient today, HIPAA identifiers verified x 2 and gave verbal consent for [facility] pharmacy services.  Introduced self and reason for call. Patient asked where her medicine was. Contacted patient's preferred pharmacy, [facility] in [place]. Pharmacist stated that usually Joann Gutierrez calls when she needs medicine so they were waiting for her call before filling her prescriptions. They will get patient's prescriptions ready and deliver them today between 2:30-5:30 pm   Updated patient on prescription delivery from [place] and asked her to please call the pharmacy if she needs to change this plan. Patient verbalized understanding.   Scheduled a home visit for later this week, after medications have been delivered, so that I can review the medications completely.   Plan: Home visit scheduled. Will provide patient with Joann Gutierrez contact information at that appointment. Patient verbalizes understanding.   [name], PharmD Clinical Pharmacist, [place] [facility] [phone]

## 2017-07-07 NOTE — Patient Outreach (Signed)
[place] [facility]) Care Management  07/07/2017  Joann Gutierrez 01-26-1945 [id number]    Unsuccessful outreach (2nd attempt)  RN attempted outreach call today however unsuccessful but able to leave a HIPAA approved voice message requesting a call back.  Will address needs and referral issues at that time. Will also scheduled another outreach call over the next week. Note communication with other team member also unsuccessful with outreach calls.   Raina Mina, RN Care Management Coordinator [place] Office [phone]

## 2017-07-08 ENCOUNTER — Other Ambulatory Visit: Payer: Self-pay | Admitting: Pharmacist

## 2017-07-08 NOTE — Patient Outreach (Signed)
[place] [facility]) Care Management  07/08/2017  TYSHEA IMEL [birth year] [id number]   Spoke with patient today to make sure patient was ready for home visit. Patient states [facility] never delivered her prescriptions today. (See yesterday's note for further detail).   Will reschedule home visit to later this week when patient is in possession of her medications. [place] to double check on status of patient's medications. Left a voicemail (odd?) detailing reason for the call and my callback number. They did not return my call by end of business day.   Plan:  Will double check that patient has received medications from [facility] by tomorrow afternoon. Will reschedule home visit for later this week.   [name], PharmD Clinical Pharmacist, [place] [facility] [phone]

## 2017-07-09 ENCOUNTER — Other Ambulatory Visit: Payer: Self-pay | Admitting: Pharmacist

## 2017-07-09 NOTE — Patient Outreach (Signed)
[place] [facility]) Care Management  07/09/2017  TREONNA KLEE [birth year] [id number]   Spoke with patient today to check that patient has received her prescriptions from [facility]. Patient states that she has. We scheduled a home visit for Friday, June 21.   [name], PharmD Clinical Pharmacist, [place] [phone]

## 2017-07-11 ENCOUNTER — Telehealth: Payer: Self-pay | Admitting: Pulmonary Disease

## 2017-07-11 ENCOUNTER — Other Ambulatory Visit: Payer: Self-pay | Admitting: Pharmacist

## 2017-07-11 MED ORDER — FLUTICASONE FUROATE-VILANTEROL 100-25 MCG/INH IN AEPB: 1.0000 | INHALATION_SPRAY | Freq: Every day | RESPIRATORY_TRACT | 5 refills | Status: DC

## 2017-07-11 MED ORDER — ALBUTEROL SULFATE HFA 108 (90 BASE) MCG/ACT IN AERS: 2.0000 | INHALATION_SPRAY | Freq: Four times a day (QID) | RESPIRATORY_TRACT | 6 refills | Status: DC | PRN

## 2017-07-11 NOTE — Telephone Encounter (Signed)
Called and spoke to [place] with [place].  Joann Gutierrez is requesting Rx for eye drop, Breo & Ventolin, as pt is switching pharmacies.  Rx for Breo & Ventolin has been sent to [facility]. Lattie Haw that pt will need to contact PCP regarding eye drops, as this was not prescribed by our office.  Nothing further is needed.

## 2017-07-11 NOTE — Patient Outreach (Signed)
[place] [facility]) Care Management  Joann Gutierrez   07/11/2017  Joann Gutierrez Aug 08, 1945 [id number]  Subjective: 72 y.o. year old female referred to [place] for Medication Assistance and Medication Management Referred by Enzo [place], [facility] Telephonic RN for polypharmacy medication review and difficulty affording medications.  PMH s/f: HTN, OSA, chronic obstructive asthma, chronic respiratory failure, GERD, diabetes mellitus, peripheral neuropathy, CKD, hypercholesterolemia, obesity, anemia  Patient has previously worked with [facility] clinical pharmacy team Joann Gutierrez) in 2017 for medication adherence.  Patient with Medicare Part D coverage through [employer] ([employer]).   Patient confirms identity with HIPAA-identifiers x2 and gives verbal consent to speak about medications. Caregiver and friend, Joann Gutierrez, was also present in home.    Subjective:   Medication Adherence: Patient has difficulty remembering her medications. Dispense report suggests poor adherence based on days supply and I noted several older pill bottles in the home.  Medication Assistance:  Patient has full LIS (Medicare Extra Help) but states sometimes it is still difficult to afford her insulin and inhalers that [place] $8.50 each.  Medication Management:  Patient relies on caregiver and friend, Joann Gutierrez, to help her with her medications. She does not have a medication system that she uses to keep track. She has a home BP cuff she will use. She has no problem with getting diabetes testing supplies. She tests her blood sugar every morning. This morning it was 111 mg/dL.   Objective:   Encounter Medications: Outpatient Encounter Medications as of 07/11/2017  Medication Sig Note  . albuterol (PROVENTIL HFA;VENTOLIN HFA) 108 (90 Base) MCG/ACT inhaler Inhale 2 puffs into the lungs every 6 (six) hours as needed for wheezing or shortness of breath. 07/11/2017: Patient needs a new  prescription  . aspirin EC 81 MG tablet Take 81 mg by mouth daily.   Marland Kitchen azelastine (ASTELIN) 0.1 % nasal spray Place 2 sprays into both nostrils 2 (two) times daily. Use in each nostril as directed   . carvedilol (COREG) 12.5 MG tablet Take 12.5 mg by mouth 2 (two) times daily with a meal.   . donepezil (ARICEPT) 10 MG tablet Take 10 mg by mouth daily.   Marland Kitchen doxazosin (CARDURA) 4 MG tablet Take 4 mg by mouth at bedtime.   Marland Kitchen ezetimibe (ZETIA) 10 MG tablet Take 10 mg by mouth at bedtime.    . fluticasone (FLONASE) 50 MCG/ACT nasal spray USE TWO SPRAYS INTO BOTH NOSTRILS DAILY   . fluticasone furoate-vilanterol (BREO ELLIPTA) 100-25 MCG/INH AEPB Inhale 1 puff into the lungs daily.   . furosemide (LASIX) 80 MG tablet Take 1 tablet (80 mg total) by mouth daily. (Patient taking differently: Take 80 mg by mouth 2 (two) times daily. )   . gabapentin (NEURONTIN) 300 MG capsule Take 1 capsule (300 mg total) by mouth 2 (two) times daily.   . hydrALAZINE (APRESOLINE) 100 MG tablet Take 100 mg by mouth 3 (three) times daily.   . isosorbide mononitrate (IMDUR) 120 MG 24 hr tablet Take 1 tablet (120 mg total) by mouth daily. PATIENT NEEDS TO CONTACT OFFICE FOR ADDITIONAL REFILLS   . ketorolac (ACULAR) 0.5 % ophthalmic solution PLACE ONE DROP INTO THE LEFT EYE FOUR TIMES A DAY   . LEVEMIR FLEXTOUCH 100 UNIT/ML Pen Inject 15 Units as directed every morning.   . metolazone (ZAROXOLYN) 2.5 MG tablet Take 2.5 mg by mouth daily. 03/07/2017: Pt takes every mon and fri.  . montelukast (SINGULAIR) 10 MG tablet TAKE ONE TABLET BY MOUTH ONCE DAILY   .  OXYGEN Inhale into the lungs. CPAP with oxygen at bedtime   . raloxifene (EVISTA) 60 MG tablet Take 60 mg by mouth every morning.    . sucralfate (CARAFATE) 1 g tablet Take 1 g by mouth 4 (four) times daily -  with meals and at bedtime.   . TRUEPLUS PEN NEEDLES 32G X 4 MM MISC INJECT THREE TIMES A DAY AS DIRECTED 07/11/2017: With Levemir  . valACYclovir (VALTREX) 500 MG tablet  Take 1 tablet (500 mg total) by mouth 3 (three) times daily.   . [DISCONTINUED] doxazosin (CARDURA) 2 MG tablet Take 4 mg by mouth at bedtime.    . [DISCONTINUED] fluticasone furoate-vilanterol (BREO ELLIPTA) 100-25 MCG/INH AEPB Inhale 1 puff into the lungs daily.   Marland Kitchen atorvastatin (LIPITOR) 40 MG tablet Take 40 mg by mouth daily.   Marland Kitchen guaiFENesin (MUCINEX) 600 MG 12 hr tablet Take 2 tablets (1,200 mg total) by mouth 2 (two) times daily as needed for cough or to loosen phlegm. (Patient not taking: Reported on 07/11/2017)   . KLOR-CON M20 20 MEQ tablet Take 20 mEq by mouth daily.    Marland Kitchen losartan (COZAAR) 100 MG tablet Take 100 mg by mouth daily.   . nitroGLYCERIN (NITROSTAT) 0.4 MG SL tablet PLACE ONE TABLET UNDER THE TONGUE EVERY FIVE MINUTES AS NEEDED FOR CHEST PAIN (Patient not taking: Reported on 07/11/2017) 07/11/2017: Needs new prescription   . omeprazole (PRILOSEC) 20 MG capsule Take 1 capsule (20 mg total) by mouth 2 (two) times daily before a meal.   . Vitamin D, Ergocalciferol, (DRISDOL) 50000 UNITS CAPS capsule Take 50,000 Units by mouth every Monday.    . [DISCONTINUED] VENTOLIN HFA 108 (90 Base) MCG/ACT inhaler INHALE 2 PUFFS INTO THE LUNGS EVERY SIX HOURS AS NEEDED FOR SHORTNESSOF BREATH    Facility-Administered Encounter Medications as of 07/11/2017  Medication  . triamcinolone acetonide (TRIESENCE) 40 MG/ML subtenons injection 20 mg    Functional Status: In your present state of health, do you have any difficulty performing the following activities: 06/03/2017 03/25/2017  Hearing? N N  Vision? N N  Difficulty concentrating or making decisions? N N  Walking or climbing stairs? Y Y  Dressing or bathing? N N  Some recent data might be hidden    Fall/Depression Screening: Fall Risk  06/27/2017 12/21/2015 12/11/2015  Falls in the past year? Yes No Yes  Number falls in past yr: 1 - 1  Injury with Fall? No - No  Risk Factor Category  - - -  Risk for fall due to : History of  fall(s);Impaired vision;Impaired balance/gait;Medication side effect;Impaired mobility - History of fall(s);Impaired balance/gait;Impaired mobility;Impaired vision;Mental status change  Follow up Falls evaluation completed;Falls prevention discussed - Education provided;Falls prevention discussed  Comment - - -   PHQ 2/9 Scores 06/27/2017 12/21/2015 12/11/2015 12/06/2015 08/03/2015 06/05/2015 05/15/2015  PHQ - 2 Score 0 0 1 0 0 0 0  PHQ- 9 Score - - - - - - -  Exception Documentation - - - - - - -   Assessment:  Drugs sorted by system:  Neurologic/Psychologic: donepezil, gabapentin,   Cardiovascular: aspirin, carvedilol, doxazosin, ezetimibe, furosemide, hydralazine, isosorbide mononitrate, metolazone,   Pulmonary/Allergy: albuterol, azelastine, flonase, Breo Ellipta, montelukast  Gastrointestinal: omeprazole, sucralfate,   Endocrine: levemir  Infectious Diseases: valacyclovir  Miscellaneous: raloxifene  Drugs sorted by problem: HTN: carvedilol, doxazosin, hydralazine, furosemide, metolazone OSA: chronic obstructive asthma, chronic respiratory failure: albuterol, azelastine, flonase, Breo Ellipta, montelukast GERD: omeprazole, sucralfate diabetes mellitus: levemir, aspirin peripheral neuropathy: gabapentin CKD:  Epogen alfa injection Hypercholesterolemia: ezetimibe Obesity: Anemia: Epoetin alfa injection Herpes zoster: valacyclovir Unknown: isosorbide mononitrate, donepezil, raloxifene  Duplications in therapy: albuterol/Proventil was on medication list twice Gaps in therapy: DEXA scan; atorvastatin;  Medications to avoid in the elderly: doxazosin Drug interactions: none of clinical significance  Plan: 1. Medication adherence: Set patient up using pill box. Explained to patient and caregiver how to fil the pill box each week. Counseled patient on adherence and what each medication was for.  2. Medication assistance: Upon inquiring further into patient's medication  affordability issues, found that patient already has full Extra Help (LIS) for prescriptions through [employer]. As such, patient will not qualify for individual drug manufacturer's copay assistance programs. Will refer to social work for further financial work up.  3. Medication management: Provided patient counseling on each medication, updated medication list, and filled pill box. Showed caregiver Joann Gutierrez how to fill the pill box each week.  4. Follow up: Patient states she does not have further needs of pharmacy services. Will close out clinical pharmacy episode at this time. Patient thanked [facility] and verbalized understanding. Provided patient with my card and contact information should she have any clinical pharmacy need in the future.   [name], PharmD Clinical Pharmacist, [place] [facility] [phone]

## 2017-07-14 ENCOUNTER — Other Ambulatory Visit: Payer: Self-pay | Admitting: *Deleted

## 2017-07-14 NOTE — Patient Outreach (Signed)
[place] [facility]) Care Management  07/14/2017  Joann Gutierrez [birth year] [id number]    Telephone Assessment (3rd unsuccessful attempt)  RN attempted outreach call however unsuccessful and only able to leave a HIPAA approved voice message requesting a call back. RN also communicated with the previously involved social worker Almyra Free [facility]) who indicated she has informed the pt on the last home visit last week that RN is attempting to reach her (pt is aware). Social worker no longer involved with pt care. RN will close case tomorrow based upon outreach letter sent to pt several weeks ago if no response to call today.  Raina Mina, RN Care Management Coordinator [place] Office [phone]

## 2017-07-15 ENCOUNTER — Other Ambulatory Visit: Payer: Self-pay | Admitting: *Deleted

## 2017-07-15 ENCOUNTER — Ambulatory Visit (HOSPITAL_COMMUNITY)
Admission: RE | Admit: 2017-07-15 | Discharge: 2017-07-15 | Disposition: A | Discharge status: Home / Self Care | Payer: Medicare Other | Source: Ambulatory Visit | Attending: Nephrology | Admitting: Nephrology

## 2017-07-15 VITALS — BP 150/75 | HR 68 | Temp 98.8°F | Resp 20

## 2017-07-15 DIAGNOSIS — N183 Chronic kidney disease, stage 3 unspecified: Secondary | ICD-10-CM

## 2017-07-15 DIAGNOSIS — D631 Anemia in chronic kidney disease: Secondary | ICD-10-CM | POA: Insufficient documentation

## 2017-07-15 LAB — POCT HEMOGLOBIN-HEMACUE: HEMOGLOBIN: 9.7 g/dL — AB (ref 12.0–15.0)

## 2017-07-15 MED ORDER — EPOETIN ALFA 10000 UNIT/ML IJ SOLN
INTRAMUSCULAR | Status: AC
  Administered 2017-07-15: 10000 [IU]
  Filled 2017-07-15: qty 1

## 2017-07-15 MED ORDER — EPOETIN ALFA 20000 UNIT/ML IJ SOLN
INTRAMUSCULAR | Status: AC
  Administered 2017-07-15: 20000 [IU]
  Filled 2017-07-15: qty 1

## 2017-07-15 MED ORDER — EPOETIN ALFA 40000 UNIT/ML IJ SOLN: 30000.0000 [IU] | INTRAMUSCULAR | Status: DC

## 2017-07-15 NOTE — Patient Outreach (Signed)
[place] [facility]) Care Management  07/15/2017  Joann Gutierrez 07-24-1945 [id number]   RN received a voice message from this pt requesting a call back. RN returned the call and verified pt and explained the purpose for the call and inquire further on possible assistance. Pt reports her only concern was too many medications. RN encouraged pt to discuss this issues with her provider. She indicated she has and she was informed she needed to take all of the medications prescribed at this time. RN has also alerted pt that she has consulted with a [place] on 07/11/2017 [name] concerning her pharmacy needs as pt has confirmed no new issues. Pt made aware to contact her provider prior to making any changing to her medications (pt verbalized an understanding).    RN inquired on any other needs as pt confirms her diabetes is under control but she does not monitor her HF with daily weights. RN inquired on pt's weight reported 252 lbs after all her medications are taken. States she has always had swelling to her feet and ankles but continue to elevate to reduce this swelling. RN educated pt on HF zones and the importance of daily weights and controlling fluid retention. Educated pt on if she gains 3 lbs overnight or 5 lbs within one week with any precipitating symptoms of HF to contact her provider for interventions.  Offered to further engage with possible home visits for a more one-on-one consultation on HF zones however pt opt to decline. RN also offered telephonic disease management related to this diagnosis but pt continue to decline [facility] services. Strongly encouraged pt to weigh daily and contact her provider again with any symptoms related to HF that was discussed today (pt again verbalized an understanding). Will notify pt's provider concerning pt's disposition with [facility] services. No other inquires or request at this time.   Raina Mina, RN Care Management Coordinator [place] Office [phone]

## 2017-07-15 NOTE — Patient Outreach (Signed)
[place] [facility]) Care Management  07/15/2017  Joann Gutierrez [birth year] [id number]    RN received a call back on yesterday from pt and RN returned the call this morning however remains unsuccessful with this outreach and left another HIPAA approved voice message. Will address pt's ongoing needs at that time however based upon the unsuccessful contacts over the last few weeks will close this case per protocol. Letter will be send to provider for case closure.  Raina Mina, RN Care Management Coordinator [place] Office [phone]

## 2017-07-16 ENCOUNTER — Other Ambulatory Visit: Payer: Self-pay

## 2017-07-16 NOTE — Patient Outreach (Signed)
[place] [facility]) Care Management  07/16/2017  Joann Gutierrez 1945-04-19 [id number]   BSW contacted patient today in regards to a referral received from previous care team member and [place] Management Pharmacist, [name]. Patient able to verify HIPAA> BSW introduced self and reason for the call indicating that a referral was placed to social work to assist patient with completion of a Medicaid application.   BSW attempted to screen patient for Medicaid eligibility without success. Patient unable to report monthly income source or amount. Patient unable to report any other benefits she currently receives such as assistance through [employer] and [employer]. Patients response to all questions was "I'm not sure".  BSW inquired if the patient had someone who would be able to assist in completion of a Medicaid application. Patient stated "yes". BSW to mail patient a Medicaid application. BSW to follow up with patient in the next 2-3 weeks to verify application was received.  Daneen Schick, BSW, CDP [facility] [phone]

## 2017-07-29 ENCOUNTER — Encounter (HOSPITAL_COMMUNITY)
Admission: RE | Admit: 2017-07-29 | Discharge: 2017-07-29 | Disposition: A | Discharge status: Home / Self Care | Payer: Medicare Other | Source: Ambulatory Visit | Attending: Nephrology | Admitting: Nephrology

## 2017-07-29 VITALS — BP 142/82 | HR 69 | Temp 98.7°F

## 2017-07-29 DIAGNOSIS — N183 Chronic kidney disease, stage 3 unspecified: Secondary | ICD-10-CM

## 2017-07-29 DIAGNOSIS — D631 Anemia in chronic kidney disease: Secondary | ICD-10-CM | POA: Insufficient documentation

## 2017-07-29 LAB — IRON AND TIBC
IRON: 126 ug/dL (ref 28–170)
SATURATION RATIOS: 58 % — AB (ref 10.4–31.8)
TIBC: 217 ug/dL — AB (ref 250–450)
UIBC: 91 ug/dL

## 2017-07-29 LAB — RENAL FUNCTION PANEL
ANION GAP: 11 (ref 5–15)
Albumin: 3.1 g/dL — ABNORMAL LOW (ref 3.5–5.0)
BUN: 26 mg/dL — ABNORMAL HIGH (ref 8–23)
CO2: 28 mmol/L (ref 22–32)
Calcium: 8.9 mg/dL (ref 8.9–10.3)
Chloride: 102 mmol/L (ref 98–111)
Creatinine, Ser: 1.63 mg/dL — ABNORMAL HIGH (ref 0.44–1.00)
GFR calc Af Amer: 35 mL/min — ABNORMAL LOW (ref >60)
GFR calc non Af Amer: 30 mL/min — ABNORMAL LOW (ref >60)
GLUCOSE: 295 mg/dL — AB (ref 70–99)
POTASSIUM: 3.7 mmol/L (ref 3.5–5.1)
Phosphorus: 3.4 mg/dL (ref 2.5–4.6)
Sodium: 141 mmol/L (ref 135–145)

## 2017-07-29 LAB — FERRITIN: Ferritin: 555 ng/mL — ABNORMAL HIGH (ref 11–307)

## 2017-07-29 LAB — POCT HEMOGLOBIN-HEMACUE: Hemoglobin: 10.7 g/dL — ABNORMAL LOW (ref 12.0–15.0)

## 2017-07-29 MED ORDER — EPOETIN ALFA 10000 UNIT/ML IJ SOLN
INTRAMUSCULAR | Status: AC
  Administered 2017-07-29: 10000 [IU] via SUBCUTANEOUS
  Filled 2017-07-29: qty 1

## 2017-07-29 MED ORDER — EPOETIN ALFA 20000 UNIT/ML IJ SOLN
INTRAMUSCULAR | Status: AC
  Administered 2017-07-29: 20000 [IU] via SUBCUTANEOUS
  Filled 2017-07-29: qty 1

## 2017-07-29 MED ORDER — EPOETIN ALFA 40000 UNIT/ML IJ SOLN: 30000.0000 [IU] | INTRAMUSCULAR | Status: DC

## 2017-07-30 ENCOUNTER — Ambulatory Visit: Payer: Self-pay

## 2017-07-30 ENCOUNTER — Other Ambulatory Visit: Payer: Self-pay

## 2017-07-30 LAB — PTH, INTACT AND CALCIUM
CALCIUM TOTAL (PTH): 8.6 mg/dL — AB (ref 8.7–10.3)
PTH: 101 pg/mL — AB (ref 15–65)

## 2017-07-30 NOTE — Patient Outreach (Signed)
[facility]) Care Management  07/30/2017  Joann Gutierrez 25-Jul-1945 [id number]  BSW attempted to contact patient to verify the patient received a Medicaid application mailed by this BSW. Unfortunately patient did not answer. BSW left a  HIPAA compliant voice message requesting a return call.  Plan: BSW will attempt patient again within the next four business days.  Daneen Schick, BSW, CDP [facility] [phone]

## 2017-08-01 ENCOUNTER — Ambulatory Visit: Payer: Self-pay

## 2017-08-01 ENCOUNTER — Other Ambulatory Visit: Payer: Self-pay

## 2017-08-01 NOTE — Patient Outreach (Signed)
[place] [facility]) Care Management  08/01/2017  Joann Gutierrez 16-Feb-1945 [id number]  Unsuccessful outreach to the patient on today's date. BSW left patient a HIPAA compliant voice message requesting a return call. BSW will plan to contact patient next week to verify the community resources this BSW mailed to the patient were received. BSW will also plan to perform a case closure during the next scheduled call as no other community resource needs have been identified by the patient during previous calls.  Daneen Schick, BSW, CDP [facility] [phone]

## 2017-08-06 ENCOUNTER — Encounter: Payer: Self-pay | Admitting: Podiatry

## 2017-08-06 ENCOUNTER — Ambulatory Visit (INDEPENDENT_AMBULATORY_CARE_PROVIDER_SITE_OTHER): Payer: Medicare Other | Admitting: Podiatry

## 2017-08-06 DIAGNOSIS — B351 Tinea unguium: Secondary | ICD-10-CM

## 2017-08-06 DIAGNOSIS — M79671 Pain in right foot: Secondary | ICD-10-CM

## 2017-08-06 DIAGNOSIS — M79672 Pain in left foot: Secondary | ICD-10-CM

## 2017-08-06 NOTE — Patient Instructions (Signed)
Seen for hypertrophic nails. All nails debrided. Return in 3 months or as needed.  

## 2017-08-06 NOTE — Progress Notes (Signed)
Subjective: 72 year old IDDM female presents requesting toe nail trimmed. Stated that she was sick at home unable to walk and had to miss her last apointment. Now she is ok and able to walk with aid of a cane.  Patient is a hard of hearing.  Had cataract surgery done on left in 06/27/16.   Objective: Dermatologic:  Thick dystrophic nails x 10. Vascular: All pedal pulses are palpable. Orthopedic: Mild hallux valgus with bunion and hypermobile first ray R>L. Tight Achilles tendon bilateral.  Neurologic: All epicritic and tactile sensations grossly intact.  Assessment: Onychomycosis x 10. Painful feet.  Plan: All nails debrided. May return in 3 months.

## 2017-08-08 ENCOUNTER — Ambulatory Visit: Payer: Self-pay

## 2017-08-08 ENCOUNTER — Other Ambulatory Visit: Payer: Self-pay

## 2017-08-08 NOTE — Patient Outreach (Signed)
[facility]) Care Management  08/08/2017  JAYLE SOLARZ 1945/06/11 [id number]  BSW attempted to contact patient for a third and final time on today's date. Unfortunately patient did not answer. BSW left a  HIPAA compliant voice message requesting a return call. BSW will perform a case closure as this BSW has been unable to maintain patient contact.  BSW has sent a case closure letter to both the patient and the physician.  Daneen Schick, BSW, CDP [facility] [phone]

## 2017-08-11 DIAGNOSIS — M899 Disorder of bone, unspecified: Secondary | ICD-10-CM | POA: Diagnosis not present

## 2017-08-12 ENCOUNTER — Emergency Department (HOSPITAL_COMMUNITY): Payer: Medicare Other

## 2017-08-12 ENCOUNTER — Encounter (HOSPITAL_COMMUNITY): Payer: Medicare Other

## 2017-08-12 ENCOUNTER — Other Ambulatory Visit: Payer: Self-pay

## 2017-08-12 ENCOUNTER — Emergency Department (HOSPITAL_COMMUNITY)
Admission: EM | Admit: 2017-08-12 | Discharge: 2017-08-12 | Disposition: A | Discharge status: Home / Self Care | Payer: Medicare Other | Attending: Emergency Medicine | Admitting: Emergency Medicine

## 2017-08-12 ENCOUNTER — Encounter (HOSPITAL_COMMUNITY): Payer: Self-pay | Admitting: Emergency Medicine

## 2017-08-12 DIAGNOSIS — I2510 Atherosclerotic heart disease of native coronary artery without angina pectoris: Secondary | ICD-10-CM | POA: Diagnosis not present

## 2017-08-12 DIAGNOSIS — Z7982 Long term (current) use of aspirin: Secondary | ICD-10-CM | POA: Insufficient documentation

## 2017-08-12 DIAGNOSIS — E1122 Type 2 diabetes mellitus with diabetic chronic kidney disease: Secondary | ICD-10-CM | POA: Diagnosis not present

## 2017-08-12 DIAGNOSIS — N183 Chronic kidney disease, stage 3 (moderate): Secondary | ICD-10-CM | POA: Insufficient documentation

## 2017-08-12 DIAGNOSIS — Z794 Long term (current) use of insulin: Secondary | ICD-10-CM | POA: Diagnosis not present

## 2017-08-12 DIAGNOSIS — R1010 Upper abdominal pain, unspecified: Secondary | ICD-10-CM

## 2017-08-12 DIAGNOSIS — F419 Anxiety disorder, unspecified: Secondary | ICD-10-CM | POA: Diagnosis not present

## 2017-08-12 DIAGNOSIS — I509 Heart failure, unspecified: Secondary | ICD-10-CM | POA: Insufficient documentation

## 2017-08-12 DIAGNOSIS — R600 Localized edema: Secondary | ICD-10-CM | POA: Diagnosis not present

## 2017-08-12 DIAGNOSIS — J45909 Unspecified asthma, uncomplicated: Secondary | ICD-10-CM | POA: Insufficient documentation

## 2017-08-12 DIAGNOSIS — Z79899 Other long term (current) drug therapy: Secondary | ICD-10-CM | POA: Diagnosis not present

## 2017-08-12 DIAGNOSIS — R1012 Left upper quadrant pain: Secondary | ICD-10-CM | POA: Insufficient documentation

## 2017-08-12 DIAGNOSIS — R42 Dizziness and giddiness: Secondary | ICD-10-CM | POA: Diagnosis not present

## 2017-08-12 DIAGNOSIS — H81399 Other peripheral vertigo, unspecified ear: Secondary | ICD-10-CM | POA: Insufficient documentation

## 2017-08-12 DIAGNOSIS — R079 Chest pain, unspecified: Secondary | ICD-10-CM | POA: Diagnosis not present

## 2017-08-12 DIAGNOSIS — I130 Hypertensive heart and chronic kidney disease with heart failure and stage 1 through stage 4 chronic kidney disease, or unspecified chronic kidney disease: Secondary | ICD-10-CM | POA: Insufficient documentation

## 2017-08-12 DIAGNOSIS — R531 Weakness: Secondary | ICD-10-CM | POA: Diagnosis not present

## 2017-08-12 DIAGNOSIS — Z96652 Presence of left artificial knee joint: Secondary | ICD-10-CM | POA: Insufficient documentation

## 2017-08-12 DIAGNOSIS — Z87891 Personal history of nicotine dependence: Secondary | ICD-10-CM | POA: Diagnosis not present

## 2017-08-12 DIAGNOSIS — R270 Ataxia, unspecified: Secondary | ICD-10-CM | POA: Diagnosis not present

## 2017-08-12 LAB — COMPREHENSIVE METABOLIC PANEL
ALT: 9 U/L (ref 0–44)
AST: 15 U/L (ref 15–41)
Albumin: 3.1 g/dL — ABNORMAL LOW (ref 3.5–5.0)
Alkaline Phosphatase: 94 U/L (ref 38–126)
Anion gap: 12 (ref 5–15)
BILIRUBIN TOTAL: 0.7 mg/dL (ref 0.3–1.2)
BUN: 23 mg/dL (ref 8–23)
CHLORIDE: 102 mmol/L (ref 98–111)
CO2: 27 mmol/L (ref 22–32)
CREATININE: 1.63 mg/dL — AB (ref 0.44–1.00)
Calcium: 8.7 mg/dL — ABNORMAL LOW (ref 8.9–10.3)
GFR, EST AFRICAN AMERICAN: 35 mL/min — AB (ref >60)
GFR, EST NON AFRICAN AMERICAN: 30 mL/min — AB (ref >60)
Glucose, Bld: 296 mg/dL — ABNORMAL HIGH (ref 70–99)
POTASSIUM: 3.7 mmol/L (ref 3.5–5.1)
Sodium: 141 mmol/L (ref 135–145)
Total Protein: 7.3 g/dL (ref 6.5–8.1)

## 2017-08-12 LAB — CBC
HEMATOCRIT: 37.4 % (ref 36.0–46.0)
Hemoglobin: 11.1 g/dL — ABNORMAL LOW (ref 12.0–15.0)
MCH: 22.3 pg — AB (ref 26.0–34.0)
MCHC: 29.7 g/dL — AB (ref 30.0–36.0)
MCV: 75.1 fL — ABNORMAL LOW (ref 78.0–100.0)
Platelets: 180 10*3/uL (ref 150–400)
RBC: 4.98 MIL/uL (ref 3.87–5.11)
RDW: 20.4 % — AB (ref 11.5–15.5)
WBC: 5.2 10*3/uL (ref 4.0–10.5)

## 2017-08-12 LAB — URINALYSIS, ROUTINE W REFLEX MICROSCOPIC
BILIRUBIN URINE: NEGATIVE
Glucose, UA: >=500 mg/dL — AB
Hgb urine dipstick: NEGATIVE
Ketones, ur: NEGATIVE mg/dL
LEUKOCYTES UA: NEGATIVE
Nitrite: NEGATIVE
PH: 7 (ref 5.0–8.0)
Protein, ur: 100 mg/dL — AB
SPECIFIC GRAVITY, URINE: 1.011 (ref 1.005–1.030)

## 2017-08-12 LAB — LIPASE, BLOOD: LIPASE: 27 U/L (ref 11–51)

## 2017-08-12 LAB — I-STAT TROPONIN, ED: Troponin i, poc: 0 ng/mL (ref 0.00–0.08)

## 2017-08-12 MED ORDER — LORAZEPAM 2 MG/ML IJ SOLN
0.5000 mg | Freq: Once | INTRAMUSCULAR | Status: AC
  Administered 2017-08-12: 0.5 mg via INTRAVENOUS
  Filled 2017-08-12: qty 1

## 2017-08-12 MED ORDER — MECLIZINE HCL 25 MG PO TABS: 25.0000 mg | ORAL_TABLET | Freq: Three times a day (TID) | ORAL | 0 refills | Status: DC | PRN

## 2017-08-12 MED ORDER — MECLIZINE HCL 25 MG PO TABS
25.0000 mg | ORAL_TABLET | Freq: Once | ORAL | Status: AC
  Administered 2017-08-12: 25 mg via ORAL
  Filled 2017-08-12: qty 1

## 2017-08-12 MED ORDER — GI COCKTAIL ~~LOC~~
30.0000 mL | Freq: Once | ORAL | Status: AC
  Administered 2017-08-12: 30 mL via ORAL
  Filled 2017-08-12: qty 30

## 2017-08-12 MED ORDER — ONDANSETRON HCL 4 MG/2ML IJ SOLN
4.0000 mg | Freq: Once | INTRAMUSCULAR | Status: AC
Start: 2017-08-12 — End: 2017-08-12
  Administered 2017-08-12: 4 mg via INTRAVENOUS
  Filled 2017-08-12: qty 2

## 2017-08-12 NOTE — ED Notes (Signed)
Pt ambulates well. Steady gait noted with the aid of a walker. No additional assistance needed. MD aware.

## 2017-08-12 NOTE — ED Notes (Signed)
To MRI

## 2017-08-12 NOTE — ED Triage Notes (Signed)
Pt. Stated, I woke up this morning and I just didn't feel good. Ive got pain in my legs, chest pain and a headache that started this morning.

## 2017-08-12 NOTE — ED Provider Notes (Signed)
[facility] EMERGENCY DEPARTMENT Provider Note   CSN: [id number] Arrival date & time: 08/12/17  0938     History   Chief Complaint Chief Complaint  Patient presents with  . Dizziness    HPI Joann Gutierrez is a 72 y.o. female.  HPI Patient presents with acute onset spinning sensation when she woke up this morning at 530.  States she was having generalized weakness and difficulty ambulating.  Associated with nausea but no vomiting.  Was at her baseline when she went to bed last night around 7 PM.  Patient also complains of left upper quadrant abdominal pain which is been present for "sometime".  She has ongoing lower extremity swelling and discomfort which is unchanged.  Denies visual changes, focal weakness or numbness.  States she is had similar symptoms in the past but not recently or this severe. Past Medical History:  Diagnosis Date  . Adenomatous colon polyp   . Allergy   . Anemia   . Asthma       . CAD (coronary artery disease)    Mild very minimal coronary disease with 20% obtuse marginal stenosis  . Carpal tunnel syndrome on left   . CHF (congestive heart failure) ([place])   . Chronic kidney disease (CKD), stage III (moderate) (HCC)   . COPD (chronic obstructive pulmonary disease) ([place])   . CVA (cerebral infarction)   . Depression   . 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 Diverticulosis   . 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 Esophageal dysmotility   . Esophageal stricture   . Gastritis   . Gastroparesis 08/21/2007  . GERD (gastroesophageal reflux disease)   . Hair loss   . Hearing loss of both ears   . Hernia, hiatal   . Hyperlipidemia   . Hypertension   . Morbid obesity ([place])   . Obesity   . Osteoarthritis   . OSTEOARTHRITIS 08/09/2006  . Osteoporosis   . 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 RENAL INSUFFICIENCY 02/16/2009  . 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 Seizures ([place])    pt thinks it has been several  monthes since she had a seisure  . Shingles   . Sickle cell trait ([place])   . Stroke ([place])   . Tubular adenoma of colon     Patient Active Problem List   Diagnosis Date Noted  . Ineffective health maintenance 06/26/2017  . Medication management 06/26/2017  . Retinal edema 10/07/2016  . Postherpetic neuralgia 12/21/2015  . Herpes zoster with complication 18/29/9371  . Cellulitis of face 11/27/2015  . Anemia of chronic disease 08/17/2015  . History of colonic polyps   . Hypertensive emergency 05/20/2015  . Hypokalemia 05/20/2015  . AKI (acute kidney injury) ([place])   . Esophageal dysmotility 03/01/2015  . Throat congestion 10/11/2014  . Morbid obesity due to excess calories ([place]) 09/23/2014  . Acute asthmatic bronchitis 05/20/2014  . Abdominal pain, other specified site 09/13/2013  . Elevated liver enzymes 09/13/2013  . Cough 05/05/2013  . Pain in lower limb 03/05/2013  . Dyspnea 02/03/2013  . Chronic respiratory failure ([place]) 12/13/2012  . Stricture and stenosis of esophagus 08/31/2012  . Onychomycosis 06/01/2012  . Pain in joint, ankle and foot 06/01/2012  . Insulin dependent diabetes mellitus ([place]) 04/29/2012  . Essential hypertension 07/19/2011  . CKD (chronic kidney disease), stage III ([place]) 05/19/2011  . Family history of malignant neoplasm of gastrointestinal tract 10/30/2010  . Bloating 10/16/2010  .  Epigastric pain 10/16/2010  . Foot pain 09/25/2010  . Dysphagia 07/16/2010  . GERD (gastroesophageal reflux disease) 07/16/2010  . Microcytic anemia 07/16/2010  . Obstructive sleep apnea 06/16/2009  . HYPERCHOLESTEROLEMIA 02/16/2009  . 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 Gastroparesis 08/21/2007  . Chronic obstructive asthma ([place]) 08/09/2006    Past Surgical History:  Procedure Laterality Date  . ABDOMINAL HYSTERECTOMY    . ARTERY BIOPSY  01/07/2011   Procedure: MINOR BIOPSY TEMPORAL ARTERY;  Surgeon: Haywood Lasso, MD;  Location: [facility];  Service: General;  Laterality: Left;  left temporal artery biopsy  . bil foot surgery    . BREAST LUMPECTOMY     benign  . BREAST LUMPECTOMY     both breast lumps removed   . CATARACT EXTRACTION Left 06/2016   Dr. Read Drivers  . COLONOSCOPY    . COLONOSCOPY WITH PROPOFOL N/A 07/05/2015   Procedure: COLONOSCOPY WITH PROPOFOL;  Surgeon: Manus Gunning, MD;  Location: [facility];  Service: Gastroenterology;  Laterality: N/A;  . ERD  08/08/2000  . ESOPHAGEAL MANOMETRY N/A 03/13/2015   Procedure: ESOPHAGEAL MANOMETRY (EM);  Surgeon: Manus Gunning, MD;  Location: [facility];  Service: Gastroenterology;  Laterality: N/A;  . ESOPHAGOGASTRODUODENOSCOPY  06/25/2006  . ESOPHAGOGASTRODUODENOSCOPY (EGD) WITH PROPOFOL N/A 07/05/2015   Procedure: ESOPHAGOGASTRODUODENOSCOPY (EGD) WITH PROPOFOL;  Surgeon: Manus Gunning, MD;  Location: [facility];  Service: Gastroenterology;  Laterality: N/A;  . HERNIA REPAIR    . LEFT AND RIGHT HEART CATHETERIZATION WITH CORONARY ANGIOGRAM N/A 03/03/2013   Procedure: LEFT AND RIGHT HEART CATHETERIZATION WITH CORONARY ANGIOGRAM;  Surgeon: Minus Breeding, MD;  Location[facility] C[facility] LAB;  Service: Cardiovascular;  Laterality: N/A;  . REPLACEMENT TOTAL KNEE Left 1998  . UPPER GASTROINTESTINAL ENDOSCOPY       OB History   None      Home Medications    Prior to Admission medications   Medication Sig Start Date End Date Taking? Authorizing Provider  albuterol (PROVENTIL HFA;VENTOLIN HFA) 108 (90 Base) MCG/ACT inhaler Inhale 2 puffs into the lungs every 6 (six) hours as needed for wheezing or shortness of breath. 07/11/17  Yes [name], MD  aspirin EC 81 MG tablet Take 81 mg by mouth daily.   Yes [provider]  atorvastatin (LIPITOR) 40 MG tablet Take 40 mg by mouth daily at 6 PM.    Yes [provider]  calcium carbonate (OSCAL) 1500 (600 Ca) MG TABS tablet Take 600 mg of elemental calcium by mouth daily with  breakfast.   Yes [provider]  carvedilol (COREG) 12.5 MG tablet Take 12.5 mg by mouth 2 (two) times daily with a meal.   Yes [provider]  doxazosin (CARDURA) 4 MG tablet Take 4 mg by mouth at bedtime. 07/08/17  Yes [provider]  ezetimibe (ZETIA) 10 MG tablet Take 10 mg by mouth daily.    Yes [provider]  fluticasone (FLONASE) 50 MCG/ACT nasal spray USE TWO SPRAYS INTO BOTH NOSTRILS DAILY 05/19/17  Yes Sood, Elisabeth Cara, MD  fluticasone furoate-vilanterol (BREO ELLIPTA) 100-25 MCG/INH AEPB Inhale 1 puff into the lungs daily. 07/11/17  Yes [name], MD  furosemide (LASIX) 80 MG tablet Take 1 tablet (80 mg total) by mouth daily. Patient taking differently: Take 80 mg by mouth 2 (two) times daily.  12/04/15  Yes Rai, Ripudeep K, MD  gabapentin (NEURONTIN) 300 MG capsule Take 1 capsule (300 mg total) by mouth 2 (two) times daily. 12/21/15  Yes Michel Bickers, MD  guaiFENesin (MUCINEX) 600 MG 12 hr tablet Take 2 tablets (1,200 mg total) by mouth 2 (two) times daily as needed for cough or to loosen phlegm. 10/31/16  Yes [name], MD  hydrALAZINE (APRESOLINE) 100 MG tablet Take 100 mg by mouth 3 (three) times daily.   Yes [provider]  isosorbide mononitrate (IMDUR) 120 MG 24 hr tablet Take 1 tablet (120 mg total) by mouth daily. PATIENT NEEDS TO CONTACT OFFICE FOR ADDITIONAL REFILLS 01/11/15  Yes Hochrein, Jeneen Rinks, MD  KLOR-CON M20 20 MEQ tablet Take 20 mEq by mouth daily.  06/19/11  Yes [provider]  LEVEMIR FLEXTOUCH 100 UNIT/ML Pen Inject 15 Units as directed every morning. 03/06/16  Yes [provider]  losartan (COZAAR) 100 MG tablet Take 100 mg by mouth daily.   Yes [provider]  metolazone (ZAROXOLYN) 2.5 MG tablet Take 2.5 mg by mouth daily.   Yes [provider]  mometasone-formoterol (DULERA) 100-5 MCG/ACT AERO Inhale 2 puffs into the lungs 2 (two) times daily.   Yes [provider]    montelukast (SINGULAIR) 10 MG tablet TAKE ONE TABLET BY MOUTH ONCE DAILY Patient taking differently: TAKE ONE TABLET BY MOUTH AT BEDTIME 05/19/17  Yes [name], MD  nitroGLYCERIN (NITROSTAT) 0.4 MG SL tablet PLACE ONE TABLET UNDER THE TONGUE EVERY FIVE MINUTES AS NEEDED FOR CHEST PAIN 07/08/16  Yes Minus Breeding, MD  omeprazole (PRILOSEC) 20 MG capsule Take 1 capsule (20 mg total) by mouth 2 (two) times daily before a meal. 09/18/15 08/12/17 Yes Armbruster, Carlota Raspberry, MD  OXYGEN Inhale 2 L into the lungs continuous. CPAP with oxygen at bedtime    Yes [provider]  raloxifene (EVISTA) 60 MG tablet Take 60 mg by mouth every morning.    Yes [provider]  sucralfate (CARAFATE) 1 g tablet Take 1 g by mouth 2 (two) times daily.    Yes [provider]  valACYclovir (VALTREX) 500 MG tablet Take 1 tablet (500 mg total) by mouth 3 (three) times daily. Patient taking differently: Take 1,000 mg by mouth 3 (three) times daily.  05/19/17  Yes Bernarda Caffey, MD  Vitamin D, Ergocalciferol, (DRISDOL) 50000 UNITS CAPS capsule Take 50,000 Units by mouth every Monday.    Yes [provider]  azelastine (ASTELIN) 0.1 % nasal spray Place 2 sprays into both nostrils 2 (two) times daily. Use in each nostril as directed Patient not taking: Reported on 08/12/2017 06/26/17   Lauraine Rinne, NP  ketorolac (ACULAR) 0.5 % ophthalmic solution PLACE ONE DROP INTO THE LEFT EYE FOUR TIMES A DAY Patient not taking: Reported on 08/12/2017 01/27/17   Bernarda Caffey, MD  meclizine (ANTIVERT) 25 MG tablet Take 1 tablet (25 mg total) by mouth 3 (three) times daily as needed for dizziness. 08/12/17   Julianne Rice, MD  TRUEPLUS PEN NEEDLES 32G X 4 MM MISC INJECT THREE TIMES A DAY AS DIRECTED 07/08/17   [provider]    Family History Family History  Problem Relation Age of Onset  . Liver cancer Mother        Liver Cancer  . Diabetes Mother   . Kidney disease Mother   . Heart disease  Mother        age 14's  . Heart disease Father 52       MI  . Heart attack Father        died of MI when pt was 8  . Sickle  cell anemia Father   . Colon cancer Brother   . Cancer Brother        Colon Cancer  . Diabetes Sister   . Kidney disease Sister   . Heart disease Sister        age 16's  . Allergies Sister   . Diabetes Sister   . Kidney disease Sister   . Heart disease Sister        age 25's  . Esophageal cancer Neg Hx   . Rectal cancer Neg Hx   . Stomach cancer Neg Hx   . Amblyopia Neg Hx   . Blindness Neg Hx   . Glaucoma Neg Hx   . Macular degeneration Neg Hx   . Retinal detachment Neg Hx   . Cataracts Neg Hx   . Strabismus Neg Hx   . Retinitis pigmentosa Neg Hx     Social History Social History   Tobacco Use  . Smoking status: Former Smoker    Packs/day: 0.50    Years: 10.00    Pack years: 5.00    Last attempt to quit: 04/18/1980    Years since quitting: 37.3  . Smokeless tobacco: Never Used  Substance Use Topics  . Alcohol use: No    Alcohol/week: 0.0 oz  . Drug use: No     Allergies   Morphine and related and Promethazine hcl   Review of Systems Review of Systems  Constitutional: Negative for chills and fever.  HENT: Negative for trouble swallowing.   Eyes: Negative for visual disturbance.  Respiratory: Negative for cough and shortness of breath.   Cardiovascular: Negative for chest pain, palpitations and leg swelling.  Gastrointestinal: Positive for abdominal pain and nausea. Negative for constipation, diarrhea and vomiting.  Genitourinary: Negative for dysuria, flank pain and frequency.  Musculoskeletal: Positive for gait problem. Negative for back pain and neck pain.  Skin: Negative for rash and wound.  Neurological: Positive for dizziness and weakness. Negative for light-headedness, numbness and headaches.  Psychiatric/Behavioral: The patient is nervous/anxious.   All other systems reviewed and are negative.    Physical Exam Updated  Vital Signs BP (!) 165/82   Pulse 66   Temp 98.4 F (36.9 C) (Oral)   Resp 18   Ht 5\' 4"  (1.626 m)   Wt 114.3 kg (252 lb)   SpO2 92%   BMI 43.26 kg/m   Physical Exam  Constitutional: She is oriented to person, place, and time. She appears well-developed and well-nourished. No distress.  HENT:  Head: Normocephalic and atraumatic.  Mouth/Throat: Oropharynx is clear and moist.  No facial asymmetry.  Extruded tongue is midline.  Eyes: Pupils are equal, round, and reactive to light. EOM are normal.  Horizontal nystagmus especially when looking to the left.  Neck: Normal range of motion. Neck supple. No JVD present.  Cardiovascular: Normal rate and regular rhythm. Exam reveals no gallop and no friction rub.  No murmur heard. Pulmonary/Chest: Effort normal and breath sounds normal. No stridor. No respiratory distress. She has no wheezes. She has no rales. She exhibits no tenderness.  Abdominal: Soft. Bowel sounds are normal. There is tenderness. There is no rebound and no guarding.  Patient with epigastric and left upper quadrant tenderness to palpation.  No rebound or guarding.  Musculoskeletal: Normal range of motion. She exhibits edema. She exhibits no tenderness.  2+ bilateral lower extremity edema without asymmetry.  Distal pulses intact.  Lymphadenopathy:    She has no cervical adenopathy.  Neurological: She is alert  and oriented to person, place, and time.  Patient is alert and oriented x3 with clear, goal oriented speech. Patient has 5/5 motor in bilateral upper extremities and 4/5 motor in bilateral lower extremities. Sensation is intact to light touch.  Mild dysmetria with right greater than left finger-to-nose testing.   Skin: Skin is warm and dry. Capillary refill takes less than 2 seconds. No rash noted. She is not diaphoretic. No erythema.  Psychiatric: Her behavior is normal.  Mildly anxious appearing  Nursing note and vitals reviewed.    ED Treatments / Results   Labs (all labs ordered are listed, but only abnormal results are displayed) Labs Reviewed  CBC - Abnormal; Notable for the following components:      Result Value   Hemoglobin 11.1 (*)    MCV 75.1 (*)    MCH 22.3 (*)    MCHC 29.7 (*)    RDW 20.4 (*)    All other components within normal limits  COMPREHENSIVE METABOLIC PANEL - Abnormal; Notable for the following components:   Glucose, Bld 296 (*)    Creatinine, Ser 1.63 (*)    Calcium 8.7 (*)    Albumin 3.1 (*)    GFR calc non Af Amer 30 (*)    GFR calc Af Amer 35 (*)    All other components within normal limits  URINALYSIS, ROUTINE W REFLEX MICROSCOPIC - Abnormal; Notable for the following components:   Glucose, UA >=500 (*)    Protein, ur 100 (*)    Bacteria, UA RARE (*)    All other components within normal limits  LIPASE, BLOOD  I-STAT TROPONIN, ED    EKG None  Radiology Dg Chest 2 View  Result Date: 08/12/2017 CLINICAL DATA:  Chest and leg pain.  Headache EXAM: CHEST - 2 VIEW COMPARISON:  10/24/2016; 05/20/2015 FINDINGS: Grossly unchanged borderline enlarged cardiac silhouette and mediastinal contours with tortuosity and potential slight ectasia of the thoracic aorta. There is persistent elevation/eventration of the right hemidiaphragm. No focal airspace opacities. No pleural effusion or pneumothorax. No evidence of edema. No acute osseus abnormalities. IMPRESSION: Similar findings of borderline cardiomegaly without superimposed acute cardiopulmonary disease. Electronically Signed   By: Sandi Mariscal M.D.   On: 08/12/2017 08:08   Mr Brain Wo Contrast  Result Date: 08/12/2017 CLINICAL DATA:  72 year old female with ataxia and gait abnormality on set this morning. EXAM: MRI HEAD WITHOUT CONTRAST TECHNIQUE: Multiplanar, multiecho pulse sequences of the brain and surrounding structures were obtained without intravenous contrast. COMPARISON:  Head and cervical spine CT 10/24/2016 and earlier. Brain MRI 07/03/2009. FINDINGS:  Brain: Small left lateral cerebellar infarct is stable since the 2011 brain MRI. A tiny right SCA territory lacune is also stable. No restricted diffusion or evidence of acute infarction. Chronic but increased heterogeneous T2 and FLAIR hyperintensity in the bilateral deep gray matter nuclei and cerebral white matter since the 2011 MRI. Evidence now of small chronic lacunar infarcts in both thalami (series 9, image 22). There is a chronic microhemorrhage in the right dorsal pons. Periventricular T2 and FLAIR hyperintensity is now confluent anteriorly. No cortical encephalomalacia. There is a chronic microhemorrhage in the right periatrial white matter. No restricted diffusion to suggest acute infarction. No midline shift, mass effect, evidence of mass lesion, ventriculomegaly, extra-axial collection or acute intracranial hemorrhage. Cervicomedullary junction and pituitary are within normal limits. Vascular: Major intracranial vascular flow voids are stable since 2011. Skull and upper cervical spine: Visible chronic cervical spine degeneration appears stable since 2011. Visualized bone  marrow signal is within normal limits. Sinuses/Orbits: Stable and negative. Other: Visible internal auditory structures appear normal. Mastoid air cells remain clear. Scalp and face soft tissues appear negative. IMPRESSION: 1.  No acute intracranial abnormality. 2. 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 Electronically Signed   By: Genevie [name] M.D.   On: 08/12/2017 11:24    Procedures Procedures (including critical care time)  Medications Ordered in ED Medications  LORazepam (ATIVAN) injection 0.5 mg (0.5 mg Intravenous Given 08/12/17 0838)  ondansetron (ZOFRAN) injection 4 mg (4 mg Intravenous Given 08/12/17 0838)  meclizine (ANTIVERT) tablet 25 mg (25 mg Oral Given 08/12/17  1159)  gi cocktail (Maalox,Lidocaine,Donnatal) (30 mLs Oral Given 08/12/17 1248)     Initial Impression / Assessment and Plan / ED Course  I have reviewed the triage vital signs and the nursing notes.  Pertinent labs & imaging results that were available during my care of the patient were reviewed by me and considered in my medical decision making (see chart for details).     Patient woke with symptoms of vertigo.  Last seen normal at 7 PM yesterday evening.  We will get him MRI to rule out cerebellar infarct.  Will treat symptomatically. MRI with no acute findings.  Patient is feeling better after medication.  She is ambulating with walker.  Abdominal symptoms improved with GI cocktail.  Suspect related to gastritis/GERD.  Benign abdominal exam.  Do not believe that imaging is necessary at this point.  Return precautions have been given. Final Clinical Impressions(s) / ED Diagnoses   Final diagnoses:  Peripheral vertigo, unspecified laterality  Upper abdominal pain    ED Discharge Orders        Ordered    meclizine (ANTIVERT) 25 MG tablet  3 times daily PRN,   Status:  Discontinued     08/12/17 1242    meclizine (ANTIVERT) 25 MG tablet  3 times daily PRN     08/12/17 1244       Julianne Rice, MD 08/13/17 [phone]

## 2017-08-14 ENCOUNTER — Other Ambulatory Visit: Payer: Self-pay

## 2017-08-14 NOTE — Patient Outreach (Signed)
[facility]) [facility]  08/14/2017  Joann Gutierrez 1945/11/07 [id number]   BSW received notification from Joann Gutierrez, Joann Gutierrez with [facility] informing BSW the patient contacted [facility] stating she received a case closure in the mail and did not want her case closed.   BSW attempted to outreach the patient. Unfortunately, the patient did not answer. BSW left the patient a HIPAA compliant voice message indicating this BSW was returning her call. BSW provided contact information for the patient to return this BSW's call.  Daneen Schick, BSW, CDP [facility] [phone]

## 2017-08-18 ENCOUNTER — Other Ambulatory Visit: Payer: Self-pay

## 2017-08-18 NOTE — Patient Outreach (Signed)
[facility]) Care Management  08/18/2017  IVORI STORR August 07, 1945 [id number]  Incoming call from the patient on today's date, HIPAA identifiers confirmed. The patient stated she did not want BSW to close her case for [facility]. The patient indicated the reason she did not want her case closed to be "my body needs work". BSW reminded the patient the involvement she had with this BSW previously, when BSW mailed a Medicaid application to the patient. The patient denies receiving, BSW to mail another application.  BSW inquired what else the patient may be interested in working on. The patient continued to state "my whole body needs work. I've been having chest pain for a while". BSW explained that this BSW works with [facility] and not her physician office. BSW explained to the patient that if she felt she needed to be seen by her primary physician, she would need to call and make an appointment. BSW further explained that if the patient was currently having chest pain which she felt was an emergency to call 911. The patient stated understanding.   BSW to place a Medicaid application in the mail. BSW will not mark the patient as active considering the patient denies further BSW support.  Daneen Schick, BSW, CDP [facility] [phone]

## 2017-08-26 ENCOUNTER — Ambulatory Visit (HOSPITAL_COMMUNITY)
Admission: RE | Admit: 2017-08-26 | Discharge: 2017-08-26 | Disposition: A | Discharge status: Home / Self Care | Payer: Medicare Other | Source: Ambulatory Visit | Attending: Nephrology | Admitting: Nephrology

## 2017-08-26 VITALS — BP 170/79 | HR 68 | Temp 99.6°F | Ht 64.0 in | Wt 250.0 lb

## 2017-08-26 DIAGNOSIS — N183 Chronic kidney disease, stage 3 unspecified: Secondary | ICD-10-CM

## 2017-08-26 DIAGNOSIS — D631 Anemia in chronic kidney disease: Secondary | ICD-10-CM | POA: Diagnosis not present

## 2017-08-26 LAB — POCT HEMOGLOBIN-HEMACUE: Hemoglobin: 10.4 g/dL — ABNORMAL LOW (ref 12.0–15.0)

## 2017-08-26 LAB — RENAL FUNCTION PANEL
ALBUMIN: 3 g/dL — AB (ref 3.5–5.0)
Anion gap: 8 (ref 5–15)
BUN: 26 mg/dL — AB (ref 8–23)
CHLORIDE: 104 mmol/L (ref 98–111)
CO2: 29 mmol/L (ref 22–32)
Calcium: 8.5 mg/dL — ABNORMAL LOW (ref 8.9–10.3)
Creatinine, Ser: 1.69 mg/dL — ABNORMAL HIGH (ref 0.44–1.00)
GFR calc Af Amer: 34 mL/min — ABNORMAL LOW (ref >60)
GFR calc non Af Amer: 29 mL/min — ABNORMAL LOW (ref >60)
GLUCOSE: 282 mg/dL — AB (ref 70–99)
PHOSPHORUS: 2.8 mg/dL (ref 2.5–4.6)
POTASSIUM: 3.7 mmol/L (ref 3.5–5.1)
Sodium: 141 mmol/L (ref 135–145)

## 2017-08-26 LAB — IRON AND TIBC
Iron: 88 ug/dL (ref 28–170)
SATURATION RATIOS: 40 % — AB (ref 10.4–31.8)
TIBC: 221 ug/dL — ABNORMAL LOW (ref 250–450)
UIBC: 133 ug/dL

## 2017-08-26 LAB — FERRITIN: Ferritin: 605 ng/mL — ABNORMAL HIGH (ref 11–307)

## 2017-08-26 MED ORDER — EPOETIN ALFA 40000 UNIT/ML IJ SOLN: 30000.0000 [IU] | INTRAMUSCULAR | Status: DC

## 2017-08-26 MED ORDER — EPOETIN ALFA 10000 UNIT/ML IJ SOLN
INTRAMUSCULAR | Status: AC
  Administered 2017-08-26: 10000 [IU] via SUBCUTANEOUS
  Filled 2017-08-26: qty 1

## 2017-08-26 MED ORDER — EPOETIN ALFA 20000 UNIT/ML IJ SOLN
INTRAMUSCULAR | Status: AC
  Administered 2017-08-26: 20000 [IU] via SUBCUTANEOUS
  Filled 2017-08-26: qty 1

## 2017-08-27 LAB — PTH, INTACT AND CALCIUM
CALCIUM TOTAL (PTH): 8.7 mg/dL (ref 8.7–10.3)
PTH: 147 pg/mL — ABNORMAL HIGH (ref 15–65)

## 2017-09-04 ENCOUNTER — Inpatient Hospital Stay (HOSPITAL_COMMUNITY)
Admission: EM | Admit: 2017-09-04 | Discharge: 2017-09-12 | DRG: 682 | Disposition: A | Discharge status: Skilled Nursing Facility | Payer: Medicare Other | Attending: Internal Medicine | Admitting: Internal Medicine

## 2017-09-04 ENCOUNTER — Emergency Department (HOSPITAL_COMMUNITY): Payer: Medicare Other

## 2017-09-04 ENCOUNTER — Encounter (HOSPITAL_COMMUNITY): Payer: Self-pay | Admitting: Emergency Medicine

## 2017-09-04 DIAGNOSIS — I5032 Chronic diastolic (congestive) heart failure: Secondary | ICD-10-CM | POA: Diagnosis not present

## 2017-09-04 DIAGNOSIS — K5790 Diverticulosis of intestine, part unspecified, without perforation or abscess without bleeding: Secondary | ICD-10-CM | POA: Diagnosis present

## 2017-09-04 DIAGNOSIS — Z7951 Long term (current) use of inhaled steroids: Secondary | ICD-10-CM

## 2017-09-04 DIAGNOSIS — Z888 Allergy status to other drugs, medicaments and biological substances status: Secondary | ICD-10-CM

## 2017-09-04 DIAGNOSIS — Z800 Family history of malignant neoplasm of digestive organs: Secondary | ICD-10-CM

## 2017-09-04 DIAGNOSIS — G92 Toxic encephalopathy: Secondary | ICD-10-CM | POA: Diagnosis present

## 2017-09-04 DIAGNOSIS — G4733 Obstructive sleep apnea (adult) (pediatric): Secondary | ICD-10-CM | POA: Diagnosis present

## 2017-09-04 DIAGNOSIS — N183 Chronic kidney disease, stage 3 unspecified: Secondary | ICD-10-CM | POA: Diagnosis present

## 2017-09-04 DIAGNOSIS — R34 Anuria and oliguria: Secondary | ICD-10-CM | POA: Diagnosis present

## 2017-09-04 DIAGNOSIS — R29810 Facial weakness: Secondary | ICD-10-CM | POA: Diagnosis not present

## 2017-09-04 DIAGNOSIS — R079 Chest pain, unspecified: Secondary | ICD-10-CM | POA: Diagnosis present

## 2017-09-04 DIAGNOSIS — D573 Sickle-cell trait: Secondary | ICD-10-CM | POA: Diagnosis present

## 2017-09-04 DIAGNOSIS — Z832 Family history of diseases of the blood and blood-forming organs and certain disorders involving the immune mechanism: Secondary | ICD-10-CM

## 2017-09-04 DIAGNOSIS — R569 Unspecified convulsions: Secondary | ICD-10-CM | POA: Diagnosis present

## 2017-09-04 DIAGNOSIS — D638 Anemia in other chronic diseases classified elsewhere: Secondary | ICD-10-CM | POA: Diagnosis present

## 2017-09-04 DIAGNOSIS — G8194 Hemiplegia, unspecified affecting left nondominant side: Secondary | ICD-10-CM | POA: Diagnosis not present

## 2017-09-04 DIAGNOSIS — E1122 Type 2 diabetes mellitus with diabetic chronic kidney disease: Secondary | ICD-10-CM | POA: Diagnosis present

## 2017-09-04 DIAGNOSIS — Z87891 Personal history of nicotine dependence: Secondary | ICD-10-CM

## 2017-09-04 DIAGNOSIS — Z7982 Long term (current) use of aspirin: Secondary | ICD-10-CM

## 2017-09-04 DIAGNOSIS — Z6841 Body Mass Index (BMI) 40.0 and over, adult: Secondary | ICD-10-CM | POA: Diagnosis not present

## 2017-09-04 DIAGNOSIS — Z8249 Family history of ischemic heart disease and other diseases of the circulatory system: Secondary | ICD-10-CM

## 2017-09-04 DIAGNOSIS — I959 Hypotension, unspecified: Secondary | ICD-10-CM | POA: Diagnosis not present

## 2017-09-04 DIAGNOSIS — Z841 Family history of disorders of kidney and ureter: Secondary | ICD-10-CM

## 2017-09-04 DIAGNOSIS — M1991 Primary osteoarthritis, unspecified site: Secondary | ICD-10-CM | POA: Diagnosis present

## 2017-09-04 DIAGNOSIS — H9193 Unspecified hearing loss, bilateral: Secondary | ICD-10-CM | POA: Diagnosis present

## 2017-09-04 DIAGNOSIS — Z885 Allergy status to narcotic agent status: Secondary | ICD-10-CM

## 2017-09-04 DIAGNOSIS — K5730 Diverticulosis of large intestine without perforation or abscess without bleeding: Secondary | ICD-10-CM | POA: Diagnosis not present

## 2017-09-04 DIAGNOSIS — I130 Hypertensive heart and chronic kidney disease with heart failure and stage 1 through stage 4 chronic kidney disease, or unspecified chronic kidney disease: Secondary | ICD-10-CM | POA: Diagnosis present

## 2017-09-04 DIAGNOSIS — I422 Other hypertrophic cardiomyopathy: Secondary | ICD-10-CM | POA: Diagnosis present

## 2017-09-04 DIAGNOSIS — J449 Chronic obstructive pulmonary disease, unspecified: Secondary | ICD-10-CM | POA: Diagnosis present

## 2017-09-04 DIAGNOSIS — Z90710 Acquired absence of both cervix and uterus: Secondary | ICD-10-CM

## 2017-09-04 DIAGNOSIS — R1084 Generalized abdominal pain: Secondary | ICD-10-CM | POA: Diagnosis not present

## 2017-09-04 DIAGNOSIS — R0600 Dyspnea, unspecified: Secondary | ICD-10-CM | POA: Diagnosis not present

## 2017-09-04 DIAGNOSIS — Z833 Family history of diabetes mellitus: Secondary | ICD-10-CM

## 2017-09-04 DIAGNOSIS — E1169 Type 2 diabetes mellitus with other specified complication: Secondary | ICD-10-CM

## 2017-09-04 DIAGNOSIS — K3184 Gastroparesis: Secondary | ICD-10-CM | POA: Diagnosis present

## 2017-09-04 DIAGNOSIS — K219 Gastro-esophageal reflux disease without esophagitis: Secondary | ICD-10-CM | POA: Diagnosis present

## 2017-09-04 DIAGNOSIS — I10 Essential (primary) hypertension: Secondary | ICD-10-CM | POA: Diagnosis present

## 2017-09-04 DIAGNOSIS — IMO0001 Reserved for inherently not codable concepts without codable children: Secondary | ICD-10-CM

## 2017-09-04 DIAGNOSIS — N170 Acute kidney failure with tubular necrosis: Principal | ICD-10-CM | POA: Diagnosis present

## 2017-09-04 DIAGNOSIS — I700 Atherosclerosis of aorta: Secondary | ICD-10-CM | POA: Diagnosis present

## 2017-09-04 DIAGNOSIS — Z9842 Cataract extraction status, left eye: Secondary | ICD-10-CM

## 2017-09-04 DIAGNOSIS — R0602 Shortness of breath: Secondary | ICD-10-CM | POA: Diagnosis not present

## 2017-09-04 DIAGNOSIS — I161 Hypertensive emergency: Secondary | ICD-10-CM | POA: Diagnosis present

## 2017-09-04 DIAGNOSIS — I272 Pulmonary hypertension, unspecified: Secondary | ICD-10-CM | POA: Diagnosis present

## 2017-09-04 DIAGNOSIS — R29708 NIHSS score 8: Secondary | ICD-10-CM | POA: Diagnosis not present

## 2017-09-04 DIAGNOSIS — I639 Cerebral infarction, unspecified: Secondary | ICD-10-CM | POA: Diagnosis not present

## 2017-09-04 DIAGNOSIS — D649 Anemia, unspecified: Secondary | ICD-10-CM | POA: Diagnosis present

## 2017-09-04 DIAGNOSIS — Z8673 Personal history of transient ischemic attack (TIA), and cerebral infarction without residual deficits: Secondary | ICD-10-CM

## 2017-09-04 DIAGNOSIS — Z794 Long term (current) use of insulin: Secondary | ICD-10-CM

## 2017-09-04 DIAGNOSIS — E7800 Pure hypercholesterolemia, unspecified: Secondary | ICD-10-CM | POA: Diagnosis present

## 2017-09-04 DIAGNOSIS — N179 Acute kidney failure, unspecified: Secondary | ICD-10-CM | POA: Diagnosis present

## 2017-09-04 DIAGNOSIS — Z23 Encounter for immunization: Secondary | ICD-10-CM

## 2017-09-04 DIAGNOSIS — I2510 Atherosclerotic heart disease of native coronary artery without angina pectoris: Secondary | ICD-10-CM | POA: Diagnosis present

## 2017-09-04 DIAGNOSIS — D631 Anemia in chronic kidney disease: Secondary | ICD-10-CM | POA: Diagnosis present

## 2017-09-04 DIAGNOSIS — E1143 Type 2 diabetes mellitus with diabetic autonomic (poly)neuropathy: Secondary | ICD-10-CM | POA: Diagnosis present

## 2017-09-04 DIAGNOSIS — E785 Hyperlipidemia, unspecified: Secondary | ICD-10-CM | POA: Diagnosis present

## 2017-09-04 DIAGNOSIS — E119 Type 2 diabetes mellitus without complications: Secondary | ICD-10-CM

## 2017-09-04 DIAGNOSIS — E875 Hyperkalemia: Secondary | ICD-10-CM | POA: Diagnosis not present

## 2017-09-04 LAB — BRAIN NATRIURETIC PEPTIDE: B NATRIURETIC PEPTIDE 5: 58.5 pg/mL (ref 0.0–100.0)

## 2017-09-04 LAB — CBG MONITORING, ED
GLUCOSE-CAPILLARY: 133 mg/dL — AB (ref 70–99)
Glucose-Capillary: 102 mg/dL — ABNORMAL HIGH (ref 70–99)

## 2017-09-04 LAB — URINALYSIS, ROUTINE W REFLEX MICROSCOPIC
Bilirubin Urine: NEGATIVE
GLUCOSE, UA: NEGATIVE mg/dL
Hgb urine dipstick: NEGATIVE
Ketones, ur: NEGATIVE mg/dL
Leukocytes, UA: NEGATIVE
NITRITE: NEGATIVE
PROTEIN: 100 mg/dL — AB
Specific Gravity, Urine: 1.012 (ref 1.005–1.030)
pH: 5 (ref 5.0–8.0)

## 2017-09-04 LAB — CBC WITH DIFFERENTIAL/PLATELET
BASOS ABS: 0 10*3/uL (ref 0.0–0.1)
Basophils Relative: 0 %
EOS ABS: 0.1 10*3/uL (ref 0.0–0.7)
Eosinophils Relative: 1 %
HCT: 34.8 % — ABNORMAL LOW (ref 36.0–46.0)
HEMOGLOBIN: 10.4 g/dL — AB (ref 12.0–15.0)
LYMPHS PCT: 27 %
Lymphs Abs: 2 10*3/uL (ref 0.7–4.0)
MCH: 22.6 pg — AB (ref 26.0–34.0)
MCHC: 29.9 g/dL — ABNORMAL LOW (ref 30.0–36.0)
MCV: 75.7 fL — ABNORMAL LOW (ref 78.0–100.0)
MONO ABS: 1.1 10*3/uL — AB (ref 0.1–1.0)
Monocytes Relative: 15 %
NEUTROS PCT: 57 %
Neutro Abs: 4.3 10*3/uL (ref 1.7–7.7)
PLATELETS: 200 10*3/uL (ref 150–400)
RBC: 4.6 MIL/uL (ref 3.87–5.11)
RDW: 21.2 % — ABNORMAL HIGH (ref 11.5–15.5)
WBC: 7.5 10*3/uL (ref 4.0–10.5)

## 2017-09-04 LAB — COMPREHENSIVE METABOLIC PANEL
ALT: 9 U/L (ref 0–44)
ANION GAP: 10 (ref 5–15)
AST: 17 U/L (ref 15–41)
Albumin: 3.3 g/dL — ABNORMAL LOW (ref 3.5–5.0)
Alkaline Phosphatase: 82 U/L (ref 38–126)
BUN: 38 mg/dL — ABNORMAL HIGH (ref 8–23)
CHLORIDE: 103 mmol/L (ref 98–111)
CO2: 27 mmol/L (ref 22–32)
Calcium: 8.9 mg/dL (ref 8.9–10.3)
Creatinine, Ser: 1.84 mg/dL — ABNORMAL HIGH (ref 0.44–1.00)
GFR, EST AFRICAN AMERICAN: 30 mL/min — AB (ref >60)
GFR, EST NON AFRICAN AMERICAN: 26 mL/min — AB (ref >60)
Glucose, Bld: 133 mg/dL — ABNORMAL HIGH (ref 70–99)
POTASSIUM: 3.6 mmol/L (ref 3.5–5.1)
SODIUM: 140 mmol/L (ref 135–145)
Total Bilirubin: 0.6 mg/dL (ref 0.3–1.2)
Total Protein: 7.2 g/dL (ref 6.5–8.1)

## 2017-09-04 LAB — LIPASE, BLOOD: LIPASE: 28 U/L (ref 11–51)

## 2017-09-04 LAB — TROPONIN I: TROPONIN I: 0.03 ng/mL — AB (ref <0.03)

## 2017-09-04 MED ORDER — IOHEXOL 300 MG/ML  SOLN
100.0000 mL | Freq: Once | INTRAMUSCULAR | Status: AC | PRN
Start: 2017-09-04 — End: 2017-09-04
  Administered 2017-09-04: 100 mL via INTRAVENOUS

## 2017-09-04 MED ORDER — FENTANYL CITRATE (PF) 100 MCG/2ML IJ SOLN
50.0000 ug | Freq: Once | INTRAMUSCULAR | Status: AC
  Administered 2017-09-04: 50 ug via INTRAVENOUS
  Filled 2017-09-04: qty 2

## 2017-09-04 MED ORDER — IOHEXOL 300 MG/ML  SOLN: 100.0000 mL | Freq: Once | INTRAMUSCULAR | Status: DC | PRN

## 2017-09-04 NOTE — ED Triage Notes (Signed)
Patient complains of "knots" on her left neck, under left breast, and on left side of abdomen. States they are tender and appeared 3-4 weeks ago. Patient also complains of bilateral knee swelling. Patient alert, oriented, and in no apparent distress at this time.

## 2017-09-04 NOTE — ED Notes (Signed)
ED Provider at bedside. 

## 2017-09-04 NOTE — ED Notes (Signed)
Patient transported to CT 

## 2017-09-04 NOTE — ED Provider Notes (Signed)
[facility] EMERGENCY DEPARTMENT Provider Note   CSN: [id number] Arrival date & time: 09/04/17  1325     History   Chief Complaint Chief Complaint  Patient presents with  . Mass    HPI Joann Gutierrez is a 72 y.o. female.  HPI  72 year old female presents with multiple complaints.  Unfortunately she is a poor historian.  Some history obtained from the roommate/friend.  The patient mostly complains of abdominal pain and feeling like there is a mass in her left upper quadrant and right lower quadrant.  Ongoing for 2 weeks.  Called PCP and they told her to come to the ER.  No vomiting and she still eating.  No chest pain.  She is chronically short of breath and not worse than normal.  She feels like she has gained weight and is concerned about water weight from her CHF.  She also notices a small spot to her left lateral neck that has been there for years and seems to ebb and flow in the size.  No headache.  No urinary symptoms.  Patient occasionally seems confused about what she is talking about but the roommate states that is normal for her and she is not altered today. Chronic leg swelling.  Past Medical History:  Diagnosis Date  . Adenomatous colon polyp   . Allergy   . Anemia   . Asthma       . CAD (coronary artery disease)    Mild very minimal coronary disease with 20% obtuse marginal stenosis  . Carpal tunnel syndrome on left   . CHF (congestive heart failure) ([place])   . Chronic kidney disease (CKD), stage III (moderate) (HCC)   . COPD (chronic obstructive pulmonary disease) ([place])   . CVA (cerebral infarction)   . Depression   . 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 Diverticulosis   . 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 Esophageal dysmotility   . Esophageal stricture   . Gastritis   . Gastroparesis 08/21/2007  . GERD (gastroesophageal reflux disease)   . Hair loss   . Hearing loss of both ears   . Hernia, hiatal   . Hyperlipidemia     . Hypertension   . Morbid obesity ([place])   . Obesity   . Osteoarthritis   . OSTEOARTHRITIS 08/09/2006  . Osteoporosis   . 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 RENAL INSUFFICIENCY 02/16/2009  . 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 Seizures ([place])    pt thinks it has been several monthes since she had a seisure  . Shingles   . Sickle cell trait ([place])   . Stroke ([place])   . Tubular adenoma of colon     Patient Active Problem List   Diagnosis Date Noted  . Ineffective health maintenance 06/26/2017  . Medication management 06/26/2017  . Retinal edema 10/07/2016  . Postherpetic neuralgia 12/21/2015  . Herpes zoster with complication 54/65/6812  . Cellulitis of face 11/27/2015  . Anemia of chronic disease 08/17/2015  . History of colonic polyps   . Hypertensive emergency 05/20/2015  . Hypokalemia 05/20/2015  . AKI (acute kidney injury) ([place])   . Esophageal dysmotility 03/01/2015  . Throat congestion 10/11/2014  . Morbid obesity due to excess calories ([place]) 09/23/2014  . Acute asthmatic bronchitis 05/20/2014  . Abdominal pain, other specified site 09/13/2013  . Elevated liver enzymes 09/13/2013  . Cough 05/05/2013  . Pain in lower limb 03/05/2013  . Dyspnea 02/03/2013  .  Chronic respiratory failure ([place]) 12/13/2012  . Stricture and stenosis of esophagus 08/31/2012  . Onychomycosis 06/01/2012  . Pain in joint, ankle and foot 06/01/2012  . Insulin dependent diabetes mellitus ([place]) 04/29/2012  . Essential hypertension 07/19/2011  . CKD (chronic kidney disease), stage III ([place]) 05/19/2011  . Family history of malignant neoplasm of gastrointestinal tract 10/30/2010  . Bloating 10/16/2010  . Epigastric pain 10/16/2010  . Foot pain 09/25/2010  . Dysphagia 07/16/2010  . GERD (gastroesophageal reflux disease) 07/16/2010  . Microcytic anemia 07/16/2010  . Obstructive sleep apnea 06/16/2009  . HYPERCHOLESTEROLEMIA 02/16/2009  . 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  Gastroparesis 08/21/2007  . Chronic obstructive asthma ([place]) 08/09/2006    Past Surgical History:  Procedure Laterality Date  . ABDOMINAL HYSTERECTOMY    . ARTERY BIOPSY  01/07/2011   Procedure: MINOR BIOPSY TEMPORAL ARTERY;  Surgeon: Haywood Lasso, MD;  Location: [facility];  Service: General;  Laterality: Left;  left temporal artery biopsy  . bil foot surgery    . BREAST LUMPECTOMY     benign  . BREAST LUMPECTOMY     both breast lumps removed   . CATARACT EXTRACTION Left 06/2016   Dr. Read Drivers  . COLONOSCOPY    . COLONOSCOPY WITH PROPOFOL N/A 07/05/2015   Procedure: COLONOSCOPY WITH PROPOFOL;  Surgeon: Manus Gunning, MD;  Location: [facility];  Service: Gastroenterology;  Laterality: N/A;  . ERD  08/08/2000  . ESOPHAGEAL MANOMETRY N/A 03/13/2015   Procedure: ESOPHAGEAL MANOMETRY (EM);  Surgeon: Manus Gunning, MD;  Location: [facility];  Service: Gastroenterology;  Laterality: N/A;  . ESOPHAGOGASTRODUODENOSCOPY  06/25/2006  . ESOPHAGOGASTRODUODENOSCOPY (EGD) WITH PROPOFOL N/A 07/05/2015   Procedure: ESOPHAGOGASTRODUODENOSCOPY (EGD) WITH PROPOFOL;  Surgeon: Manus Gunning, MD;  Location: [facility];  Service: Gastroenterology;  Laterality: N/A;  . HERNIA REPAIR    . LEFT AND RIGHT HEART CATHETERIZATION WITH CORONARY ANGIOGRAM N/A 03/03/2013   Procedure: LEFT AND RIGHT HEART CATHETERIZATION WITH CORONARY ANGIOGRAM;  Surgeon: Minus Breeding, MD;  Location: [facility] CATH LAB;  Service: Cardiovascular;  Laterality: N/A;  . REPLACEMENT TOTAL KNEE Left 1998  . UPPER GASTROINTESTINAL ENDOSCOPY       OB History   None      Home Medications    Prior to Admission medications   Medication Sig Start Date End Date Taking? Authorizing Provider  albuterol (PROVENTIL HFA;VENTOLIN HFA) 108 (90 Base) MCG/ACT inhaler Inhale 2 puffs into the lungs every 6 (six) hours as needed for wheezing or shortness of breath. 07/11/17   [name], MD  aspirin EC  81 MG tablet Take 81 mg by mouth daily.    [provider]  atorvastatin (LIPITOR) 40 MG tablet Take 40 mg by mouth daily at 6 PM.     [provider]  azelastine (ASTELIN) 0.1 % nasal spray Place 2 sprays into both nostrils 2 (two) times daily. Use in each nostril as directed Patient not taking: Reported on 08/12/2017 06/26/17   Lauraine Rinne, NP  calcium carbonate (OSCAL) 1500 (600 Ca) MG TABS tablet Take 600 mg of elemental calcium by mouth daily with breakfast.    [provider]  carvedilol (COREG) 12.5 MG tablet Take 12.5 mg by mouth 2 (two) times daily with a meal.    [provider]  doxazosin (CARDURA) 4 MG tablet Take 4 mg by mouth at bedtime. 07/08/17   [provider]  ezetimibe (ZETIA) 10 MG tablet Take 10 mg by mouth daily.  [provider]  fluticasone (FLONASE) 50 MCG/ACT nasal spray USE TWO SPRAYS INTO BOTH NOSTRILS DAILY 05/19/17   [name], MD  fluticasone furoate-vilanterol (BREO ELLIPTA) 100-25 MCG/INH AEPB Inhale 1 puff into the lungs daily. 07/11/17   [name], MD  furosemide (LASIX) 80 MG tablet Take 1 tablet (80 mg total) by mouth daily. Patient taking differently: Take 80 mg by mouth 2 (two) times daily.  12/04/15   Rai, Vernelle Emerald, MD  gabapentin (NEURONTIN) 300 MG capsule Take 1 capsule (300 mg total) by mouth 2 (two) times daily. 12/21/15   Michel Bickers, MD  guaiFENesin (MUCINEX) 600 MG 12 hr tablet Take 2 tablets (1,200 mg total) by mouth 2 (two) times daily as needed for cough or to loosen phlegm. 10/31/16   [name], MD  hydrALAZINE (APRESOLINE) 100 MG tablet Take 100 mg by mouth 3 (three) times daily.    [provider]  isosorbide mononitrate (IMDUR) 120 MG 24 hr tablet Take 1 tablet (120 mg total) by mouth daily. PATIENT NEEDS TO CONTACT OFFICE FOR ADDITIONAL REFILLS 01/11/15   Minus Breeding, MD  ketorolac (ACULAR) 0.5 % ophthalmic solution PLACE ONE DROP INTO THE LEFT EYE FOUR TIMES A  DAY Patient not taking: Reported on 08/12/2017 01/27/17   Bernarda Caffey, MD  KLOR-CON M20 20 MEQ tablet Take 20 mEq by mouth daily.  06/19/11   [provider]  LEVEMIR FLEXTOUCH 100 UNIT/ML Pen Inject 15 Units as directed every morning. 03/06/16   [provider]  losartan (COZAAR) 100 MG tablet Take 100 mg by mouth daily.    [provider]  meclizine (ANTIVERT) 25 MG tablet Take 1 tablet (25 mg total) by mouth 3 (three) times daily as needed for dizziness. 08/12/17   Julianne Rice, MD  metolazone (ZAROXOLYN) 2.5 MG tablet Take 2.5 mg by mouth daily.    [provider]  mometasone-formoterol (DULERA) 100-5 MCG/ACT AERO Inhale 2 puffs into the lungs 2 (two) times daily.    [provider]  montelukast (SINGULAIR) 10 MG tablet TAKE ONE TABLET BY MOUTH ONCE DAILY Patient taking differently: TAKE ONE TABLET BY MOUTH AT BEDTIME 05/19/17   [name], MD  nitroGLYCERIN (NITROSTAT) 0.4 MG SL tablet PLACE ONE TABLET UNDER THE TONGUE EVERY FIVE MINUTES AS NEEDED FOR CHEST PAIN 07/08/16   Minus Breeding, MD  omeprazole (PRILOSEC) 20 MG capsule Take 1 capsule (20 mg total) by mouth 2 (two) times daily before a meal. 09/18/15 08/12/17  Armbruster, Carlota Raspberry, MD  OXYGEN Inhale 2 L into the lungs continuous. CPAP with oxygen at bedtime     [provider]  raloxifene (EVISTA) 60 MG tablet Take 60 mg by mouth every morning.     [provider]  sucralfate (CARAFATE) 1 g tablet Take 1 g by mouth 2 (two) times daily.     [provider]  TRUEPLUS PEN NEEDLES 32G X 4 MM MISC INJECT THREE TIMES A DAY AS DIRECTED 07/08/17   [provider]  valACYclovir (VALTREX) 500 MG tablet Take 1 tablet (500 mg total) by mouth 3 (three) times daily. Patient taking differently: Take 1,000 mg by mouth 3 (three) times daily.  05/19/17   Bernarda Caffey, MD  Vitamin D, Ergocalciferol, (DRISDOL) 50000 UNITS CAPS capsule Take 50,000 Units by mouth every Monday.      [provider]    Family History Family History  Problem Relation Age of Onset  . Liver cancer Mother  Liver Cancer  . Diabetes Mother   . Kidney disease Mother   . Heart disease Mother        age 65's  . Heart disease Father 13       MI  . Heart attack Father        died of MI when pt was 89  . Sickle cell anemia Father   . Colon cancer Brother   . Cancer Brother        Colon Cancer  . Diabetes Sister   . Kidney disease Sister   . Heart disease Sister        age 33's  . Allergies Sister   . Diabetes Sister   . Kidney disease Sister   . Heart disease Sister        age 68's  . Esophageal cancer Neg Hx   . Rectal cancer Neg Hx   . Stomach cancer Neg Hx   . Amblyopia Neg Hx   . Blindness Neg Hx   . Glaucoma Neg Hx   . Macular degeneration Neg Hx   . Retinal detachment Neg Hx   . Cataracts Neg Hx   . Strabismus Neg Hx   . Retinitis pigmentosa Neg Hx     Social History Social History   Tobacco Use  . Smoking status: Former Smoker    Packs/day: 0.50    Years: 10.00    Pack years: 5.00    Last attempt to quit: 04/18/1980    Years since quitting: 37.4  . Smokeless tobacco: Never Used  Substance Use Topics  . Alcohol use: No    Alcohol/week: 0.0 standard drinks  . Drug use: No     Allergies   Morphine and related and Promethazine hcl   Review of Systems Review of Systems  Constitutional: Negative for fever.  Respiratory: Positive for shortness of breath.   Cardiovascular: Positive for leg swelling. Negative for chest pain.  Gastrointestinal: Positive for abdominal pain. Negative for vomiting.  Genitourinary: Negative for dysuria.  Psychiatric/Behavioral: Negative for confusion.  All other systems reviewed and are negative.    Physical Exam Updated Vital Signs BP (!) 190/114   Pulse 64   Temp 98.1 F (36.7 C) (Oral)   Resp (!) 27   Ht 5' 4.5" (1.638 m)   Wt 121.1 kg   SpO2 95%   BMI 45.12 kg/m   Physical Exam   Constitutional: She is oriented to person, place, and time. She appears well-developed and well-nourished. No distress.  HENT:  Head: Normocephalic and atraumatic.  Right Ear: External ear normal.  Left Ear: External ear normal.  Nose: Nose normal.  Eyes: Right eye exhibits no discharge. Left eye exhibits no discharge.  Cardiovascular: Normal rate, regular rhythm and normal heart sounds.  Pulmonary/Chest: Effort normal. No accessory muscle usage. No tachypnea. She has decreased breath sounds in the right lower field and the left lower field.    Abdominal: Soft. There is tenderness.  Diffuse abdominal tenderness.  This is to minimal palpation and thus palpating for masses is difficult because she does not tolerate the exam well.  Musculoskeletal: She exhibits edema (non pitting).  Neurological: She is alert and oriented to person, place, and time.  Skin: Skin is warm and dry. She is not diaphoretic.  Nursing note and vitals reviewed.    ED Treatments / Results  Labs (all labs ordered are listed, but only abnormal results are displayed) Labs Reviewed  URINALYSIS, ROUTINE W REFLEX MICROSCOPIC - Abnormal; Notable for  the following components:      Result Value   Protein, ur 100 (*)    Bacteria, UA RARE (*)    All other components within normal limits  CBC WITH DIFFERENTIAL/PLATELET - Abnormal; Notable for the following components:   Hemoglobin 10.4 (*)    HCT 34.8 (*)    MCV 75.7 (*)    MCH 22.6 (*)    MCHC 29.9 (*)    RDW 21.2 (*)    All other components within normal limits  CBG MONITORING, ED - Abnormal; Notable for the following components:   Glucose-Capillary 102 (*)    All other components within normal limits  CBG MONITORING, ED - Abnormal; Notable for the following components:   Glucose-Capillary 133 (*)    All other components within normal limits  COMPREHENSIVE METABOLIC PANEL  LIPASE, BLOOD  TROPONIN I  BRAIN NATRIURETIC PEPTIDE    EKG EKG  Interpretation  Date/Time:  Thursday September 04 2017 21:11:40 EDT Ventricular Rate:  59 PR Interval:    QRS Duration: 87 QT Interval:  436 QTC Calculation: 432 R Axis:   20 Text Interpretation:  Sinus arrhythmia Anteroseptal infarct, old no significant change since July 2019 Confirmed by Sherwood Gambler [phone]) on 09/04/2017 9:23:48 PM   Radiology Dg Chest 2 View  Result Date: 09/04/2017 CLINICAL DATA:  Dyspnea EXAM: CHEST - 2 VIEW COMPARISON:  08/12/2017 FINDINGS: Cardiomegaly with vascular congestion. Chronic elevation of right diaphragm. No pleural effusion. No focal airspace disease. IMPRESSION: Cardiomegaly with vascular congestion. Electronically Signed   By: Donavan Foil M.D.   On: 09/04/2017 21:58    Procedures Procedures (including critical care time)  Medications Ordered in ED Medications  fentaNYL (SUBLIMAZE) injection 50 mcg (has no administration in time range)     Initial Impression / Assessment and Plan / ED Course  I have reviewed the triage vital signs and the nursing notes.  Pertinent labs & imaging results that were available during my care of the patient were reviewed by me and considered in my medical decision making (see chart for details).     Patient will need a CT scan to evaluate her abdominal pain.  If this and her blood work is reassuring then she should be discharged home.  She is not currently altered from her baseline.  Otherwise, she has chronic shortness of breath but no hypoxia and normally wears oxygen at home.  Care transferred to [facility]  Final Clinical Impressions(s) / ED Diagnoses   Final diagnoses:  None    ED Discharge Orders    None       Sherwood Gambler, MD 09/04/17 2218

## 2017-09-04 NOTE — ED Notes (Addendum)
Daughter to nurse's station. She reports that she believes pt didn't take her meds today. Seems somewhat more confused that usually. Worried that pt may have UTI, reporting mild odor. States that pt's CBG is "usually in the 200s" and that 133 "is low for her." Reports pt gained approx 12lbs in the past week. Pt has CHF. Daughter reports pt uses 2-3Lpm of O2 only at home PRN.

## 2017-09-04 NOTE — ED Notes (Signed)
Patient placed on purewick for accurate I&o

## 2017-09-04 NOTE — ED Notes (Signed)
Patient transported to X-ray 
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
## 2017-09-05 ENCOUNTER — Observation Stay (HOSPITAL_COMMUNITY): Payer: Medicare Other

## 2017-09-05 ENCOUNTER — Other Ambulatory Visit: Payer: Self-pay

## 2017-09-05 ENCOUNTER — Encounter (HOSPITAL_COMMUNITY): Payer: Self-pay | Admitting: Internal Medicine

## 2017-09-05 DIAGNOSIS — R531 Weakness: Secondary | ICD-10-CM

## 2017-09-05 DIAGNOSIS — Z794 Long term (current) use of insulin: Secondary | ICD-10-CM

## 2017-09-05 DIAGNOSIS — R079 Chest pain, unspecified: Secondary | ICD-10-CM | POA: Diagnosis present

## 2017-09-05 DIAGNOSIS — I161 Hypertensive emergency: Secondary | ICD-10-CM | POA: Diagnosis not present

## 2017-09-05 DIAGNOSIS — E1143 Type 2 diabetes mellitus with diabetic autonomic (poly)neuropathy: Secondary | ICD-10-CM | POA: Diagnosis not present

## 2017-09-05 DIAGNOSIS — I2510 Atherosclerotic heart disease of native coronary artery without angina pectoris: Secondary | ICD-10-CM | POA: Diagnosis not present

## 2017-09-05 DIAGNOSIS — G8194 Hemiplegia, unspecified affecting left nondominant side: Secondary | ICD-10-CM | POA: Diagnosis not present

## 2017-09-05 DIAGNOSIS — I639 Cerebral infarction, unspecified: Secondary | ICD-10-CM | POA: Diagnosis not present

## 2017-09-05 DIAGNOSIS — I5032 Chronic diastolic (congestive) heart failure: Secondary | ICD-10-CM | POA: Diagnosis not present

## 2017-09-05 DIAGNOSIS — N170 Acute kidney failure with tubular necrosis: Secondary | ICD-10-CM | POA: Diagnosis not present

## 2017-09-05 DIAGNOSIS — I130 Hypertensive heart and chronic kidney disease with heart failure and stage 1 through stage 4 chronic kidney disease, or unspecified chronic kidney disease: Secondary | ICD-10-CM | POA: Diagnosis not present

## 2017-09-05 DIAGNOSIS — R29818 Other symptoms and signs involving the nervous system: Secondary | ICD-10-CM | POA: Diagnosis not present

## 2017-09-05 DIAGNOSIS — I422 Other hypertrophic cardiomyopathy: Secondary | ICD-10-CM | POA: Diagnosis not present

## 2017-09-05 DIAGNOSIS — I10 Essential (primary) hypertension: Secondary | ICD-10-CM | POA: Diagnosis not present

## 2017-09-05 DIAGNOSIS — G92 Toxic encephalopathy: Secondary | ICD-10-CM | POA: Diagnosis not present

## 2017-09-05 DIAGNOSIS — R140 Abdominal distension (gaseous): Secondary | ICD-10-CM | POA: Diagnosis not present

## 2017-09-05 DIAGNOSIS — Z6841 Body Mass Index (BMI) 40.0 and over, adult: Secondary | ICD-10-CM | POA: Diagnosis not present

## 2017-09-05 DIAGNOSIS — E119 Type 2 diabetes mellitus without complications: Secondary | ICD-10-CM

## 2017-09-05 DIAGNOSIS — Z23 Encounter for immunization: Secondary | ICD-10-CM | POA: Diagnosis not present

## 2017-09-05 LAB — GLUCOSE, CAPILLARY
GLUCOSE-CAPILLARY: 113 mg/dL — AB (ref 70–99)
GLUCOSE-CAPILLARY: 216 mg/dL — AB (ref 70–99)
GLUCOSE-CAPILLARY: 210 mg/dL — AB (ref 70–99)
Glucose-Capillary: 172 mg/dL — ABNORMAL HIGH (ref 70–99)
Glucose-Capillary: 71 mg/dL (ref 70–99)

## 2017-09-05 LAB — CBC WITH DIFFERENTIAL/PLATELET
BASOS ABS: 0 10*3/uL (ref 0.0–0.1)
Basophils Relative: 0 %
EOS ABS: 0.1 10*3/uL (ref 0.0–0.7)
Eosinophils Relative: 1 %
HCT: 36.4 % (ref 36.0–46.0)
HEMOGLOBIN: 10.7 g/dL — AB (ref 12.0–15.0)
LYMPHS ABS: 2 10*3/uL (ref 0.7–4.0)
Lymphocytes Relative: 24 %
MCH: 22 pg — ABNORMAL LOW (ref 26.0–34.0)
MCHC: 29.4 g/dL — ABNORMAL LOW (ref 30.0–36.0)
MCV: 74.9 fL — ABNORMAL LOW (ref 78.0–100.0)
MONOS PCT: 18 %
Monocytes Absolute: 1.5 10*3/uL — ABNORMAL HIGH (ref 0.1–1.0)
NEUTROS ABS: 4.6 10*3/uL (ref 1.7–7.7)
Neutrophils Relative %: 57 %
Platelets: 210 10*3/uL (ref 150–400)
RBC: 4.86 MIL/uL (ref 3.87–5.11)
RDW: 21.6 % — AB (ref 11.5–15.5)
WBC: 8.2 10*3/uL (ref 4.0–10.5)

## 2017-09-05 LAB — MRSA PCR SCREENING: MRSA by PCR: NEGATIVE

## 2017-09-05 LAB — BASIC METABOLIC PANEL
Anion gap: 11 (ref 5–15)
BUN: 32 mg/dL — AB (ref 8–23)
CALCIUM: 8.8 mg/dL — AB (ref 8.9–10.3)
CO2: 26 mmol/L (ref 22–32)
CREATININE: 1.61 mg/dL — AB (ref 0.44–1.00)
Chloride: 106 mmol/L (ref 98–111)
GFR calc Af Amer: 36 mL/min — ABNORMAL LOW (ref >60)
GFR, EST NON AFRICAN AMERICAN: 31 mL/min — AB (ref >60)
GLUCOSE: 138 mg/dL — AB (ref 70–99)
Potassium: 4 mmol/L (ref 3.5–5.1)
SODIUM: 143 mmol/L (ref 135–145)

## 2017-09-05 LAB — HEPATIC FUNCTION PANEL
ALT: 8 U/L (ref 0–44)
AST: 14 U/L — ABNORMAL LOW (ref 15–41)
Albumin: 3.2 g/dL — ABNORMAL LOW (ref 3.5–5.0)
Alkaline Phosphatase: 85 U/L (ref 38–126)
TOTAL PROTEIN: 7 g/dL (ref 6.5–8.1)
Total Bilirubin: 0.9 mg/dL (ref 0.3–1.2)

## 2017-09-05 LAB — BLOOD GAS, ARTERIAL
ACID-BASE EXCESS: 4.5 mmol/L — AB (ref 0.0–2.0)
Bicarbonate: 28.7 mmol/L — ABNORMAL HIGH (ref 20.0–28.0)
DRAWN BY: 518061
FIO2: 28
O2 Content: 2 L/min
O2 Saturation: 97.6 %
Patient temperature: 98.6
pCO2 arterial: 44.3 mmHg (ref 32.0–48.0)
pH, Arterial: 7.428 (ref 7.350–7.450)
pO2, Arterial: 93 mmHg (ref 83.0–108.0)

## 2017-09-05 LAB — AMMONIA: Ammonia: 36 umol/L — ABNORMAL HIGH (ref 9–35)

## 2017-09-05 LAB — TSH: TSH: 2.748 u[IU]/mL (ref 0.350–4.500)

## 2017-09-05 LAB — TROPONIN I
Troponin I: <0.03 ng/mL (ref <0.03)
Troponin I: <0.03 ng/mL (ref <0.03)
Troponin I: <0.03 ng/mL (ref <0.03)

## 2017-09-05 LAB — CK: Total CK: 68 U/L (ref 38–234)

## 2017-09-05 MED ORDER — ISOSORBIDE MONONITRATE ER 60 MG PO TB24
120.0000 mg | ORAL_TABLET | Freq: Every day | ORAL | Status: DC
  Administered 2017-09-05 – 2017-09-06 (×2): 120 mg via ORAL
  Filled 2017-09-05 (×2): qty 2

## 2017-09-05 MED ORDER — LOSARTAN POTASSIUM 50 MG PO TABS
100.0000 mg | ORAL_TABLET | Freq: Every day | ORAL | Status: DC
  Administered 2017-09-05 – 2017-09-06 (×2): 100 mg via ORAL
  Filled 2017-09-05 (×2): qty 2

## 2017-09-05 MED ORDER — HYDRALAZINE HCL 20 MG/ML IJ SOLN: 10.0000 mg | INTRAMUSCULAR | Status: DC | PRN

## 2017-09-05 MED ORDER — PANTOPRAZOLE SODIUM 40 MG PO TBEC
40.0000 mg | DELAYED_RELEASE_TABLET | Freq: Every day | ORAL | Status: DC
  Administered 2017-09-05 – 2017-09-12 (×8): 40 mg via ORAL
  Filled 2017-09-05 (×8): qty 1

## 2017-09-05 MED ORDER — CARVEDILOL 12.5 MG PO TABS
12.5000 mg | ORAL_TABLET | Freq: Two times a day (BID) | ORAL | Status: DC
  Administered 2017-09-05 – 2017-09-06 (×2): 12.5 mg via ORAL
  Filled 2017-09-05 (×2): qty 1

## 2017-09-05 MED ORDER — FLUTICASONE PROPIONATE 50 MCG/ACT NA SUSP
2.0000 | Freq: Every day | NASAL | Status: DC
  Administered 2017-09-05 – 2017-09-12 (×8): 2 via NASAL
  Filled 2017-09-05: qty 16

## 2017-09-05 MED ORDER — ASPIRIN 300 MG RE SUPP
300.0000 mg | Freq: Every day | RECTAL | Status: DC
  Administered 2017-09-05: 300 mg via RECTAL
  Filled 2017-09-05 (×2): qty 1

## 2017-09-05 MED ORDER — INSULIN DETEMIR 100 UNIT/ML ~~LOC~~ SOLN
15.0000 [IU] | Freq: Every day | SUBCUTANEOUS | Status: DC
  Administered 2017-09-05 – 2017-09-06 (×2): 15 [IU] via SUBCUTANEOUS
  Filled 2017-09-05 (×3): qty 0.15

## 2017-09-05 MED ORDER — DOXAZOSIN MESYLATE 4 MG PO TABS
4.0000 mg | ORAL_TABLET | Freq: Every day | ORAL | Status: DC
  Administered 2017-09-06: 4 mg via ORAL
  Filled 2017-09-05: qty 1

## 2017-09-05 MED ORDER — HYDRALAZINE HCL 20 MG/ML IJ SOLN
5.0000 mg | Freq: Once | INTRAMUSCULAR | Status: AC
  Administered 2017-09-05: 5 mg via INTRAVENOUS
  Filled 2017-09-05: qty 1

## 2017-09-05 MED ORDER — ATORVASTATIN CALCIUM 40 MG PO TABS: 40.0000 mg | ORAL_TABLET | Freq: Every day | ORAL | Status: DC

## 2017-09-05 MED ORDER — ALBUTEROL SULFATE (2.5 MG/3ML) 0.083% IN NEBU: 3.0000 mL | INHALATION_SOLUTION | Freq: Four times a day (QID) | RESPIRATORY_TRACT | Status: DC | PRN

## 2017-09-05 MED ORDER — ENOXAPARIN SODIUM 40 MG/0.4ML ~~LOC~~ SOLN
40.0000 mg | SUBCUTANEOUS | Status: DC
  Administered 2017-09-05 – 2017-09-06 (×2): 40 mg via SUBCUTANEOUS
  Filled 2017-09-05 (×2): qty 0.4

## 2017-09-05 MED ORDER — FLUTICASONE FUROATE-VILANTEROL 100-25 MCG/INH IN AEPB
1.0000 | INHALATION_SPRAY | Freq: Every day | RESPIRATORY_TRACT | Status: DC
  Administered 2017-09-05 – 2017-09-12 (×8): 1 via RESPIRATORY_TRACT
  Filled 2017-09-05: qty 28

## 2017-09-05 MED ORDER — METOLAZONE 2.5 MG PO TABS
2.5000 mg | ORAL_TABLET | ORAL | Status: DC
  Administered 2017-09-05: 2.5 mg via ORAL
  Filled 2017-09-05: qty 1

## 2017-09-05 MED ORDER — HYDRALAZINE HCL 50 MG PO TABS
100.0000 mg | ORAL_TABLET | Freq: Three times a day (TID) | ORAL | Status: DC
  Administered 2017-09-05 – 2017-09-06 (×3): 100 mg via ORAL
  Filled 2017-09-05 (×5): qty 2

## 2017-09-05 MED ORDER — ASPIRIN 325 MG PO TABS
325.0000 mg | ORAL_TABLET | Freq: Every day | ORAL | Status: DC
  Administered 2017-09-06: 325 mg via ORAL
  Filled 2017-09-05: qty 1

## 2017-09-05 MED ORDER — ACETAMINOPHEN 650 MG RE SUPP
650.0000 mg | Freq: Four times a day (QID) | RECTAL | Status: DC | PRN
  Administered 2017-09-05: 650 mg via RECTAL
  Filled 2017-09-05: qty 1

## 2017-09-05 MED ORDER — MONTELUKAST SODIUM 10 MG PO TABS
10.0000 mg | ORAL_TABLET | Freq: Every day | ORAL | Status: DC
  Administered 2017-09-06 – 2017-09-11 (×6): 10 mg via ORAL
  Filled 2017-09-05 (×6): qty 1

## 2017-09-05 MED ORDER — EZETIMIBE 10 MG PO TABS
10.0000 mg | ORAL_TABLET | Freq: Every day | ORAL | Status: DC
  Administered 2017-09-05 – 2017-09-08 (×4): 10 mg via ORAL
  Filled 2017-09-05 (×4): qty 1

## 2017-09-05 MED ORDER — ACETAMINOPHEN 325 MG PO TABS
650.0000 mg | ORAL_TABLET | Freq: Four times a day (QID) | ORAL | Status: DC | PRN
  Administered 2017-09-05 – 2017-09-07 (×2): 650 mg via ORAL
  Filled 2017-09-05 (×2): qty 2

## 2017-09-05 MED ORDER — ONDANSETRON HCL 4 MG PO TABS: 4.0000 mg | ORAL_TABLET | Freq: Four times a day (QID) | ORAL | Status: DC | PRN

## 2017-09-05 MED ORDER — NITROGLYCERIN 0.4 MG SL SUBL
0.4000 mg | SUBLINGUAL_TABLET | SUBLINGUAL | Status: DC | PRN
  Administered 2017-09-05 (×3): 0.4 mg via SUBLINGUAL
  Filled 2017-09-05 (×3): qty 1

## 2017-09-05 MED ORDER — ONDANSETRON HCL 4 MG/2ML IJ SOLN: 4.0000 mg | Freq: Four times a day (QID) | INTRAMUSCULAR | Status: DC | PRN

## 2017-09-05 MED ORDER — ASPIRIN EC 81 MG PO TBEC
81.0000 mg | DELAYED_RELEASE_TABLET | Freq: Every day | ORAL | Status: DC
  Administered 2017-09-05: 81 mg via ORAL
  Filled 2017-09-05 (×2): qty 1

## 2017-09-05 MED ORDER — FUROSEMIDE 80 MG PO TABS
80.0000 mg | ORAL_TABLET | Freq: Two times a day (BID) | ORAL | Status: DC
  Administered 2017-09-05 – 2017-09-06 (×2): 80 mg via ORAL
  Filled 2017-09-05 (×2): qty 1

## 2017-09-05 MED ORDER — GABAPENTIN 300 MG PO CAPS
300.0000 mg | ORAL_CAPSULE | Freq: Two times a day (BID) | ORAL | Status: DC
  Administered 2017-09-05 – 2017-09-10 (×10): 300 mg via ORAL
  Filled 2017-09-05 (×10): qty 1

## 2017-09-05 MED ORDER — MOMETASONE FURO-FORMOTEROL FUM 100-5 MCG/ACT IN AERO: 2.0000 | INHALATION_SPRAY | Freq: Two times a day (BID) | RESPIRATORY_TRACT | Status: DC

## 2017-09-05 MED ORDER — SODIUM CHLORIDE 0.9 % IV BOLUS: 1000.0000 mL | Freq: Once | INTRAVENOUS | Status: DC

## 2017-09-05 MED ORDER — CALCIUM CARBONATE 1250 (500 CA) MG PO TABS
500.0000 mg | ORAL_TABLET | Freq: Every day | ORAL | Status: DC
  Administered 2017-09-05 – 2017-09-12 (×8): 500 mg via ORAL
  Filled 2017-09-05 (×8): qty 1

## 2017-09-05 MED ORDER — GUAIFENESIN ER 600 MG PO TB12: 1200.0000 mg | ORAL_TABLET | Freq: Two times a day (BID) | ORAL | Status: DC | PRN

## 2017-09-05 MED ORDER — POTASSIUM CHLORIDE CRYS ER 20 MEQ PO TBCR
20.0000 meq | EXTENDED_RELEASE_TABLET | Freq: Every day | ORAL | Status: DC
  Administered 2017-09-05 – 2017-09-08 (×4): 20 meq via ORAL
  Filled 2017-09-05 (×4): qty 1

## 2017-09-05 MED ORDER — INSULIN ASPART 100 UNIT/ML ~~LOC~~ SOLN
0.0000 [IU] | Freq: Three times a day (TID) | SUBCUTANEOUS | Status: DC
  Administered 2017-09-05 (×2): 3 [IU] via SUBCUTANEOUS
  Administered 2017-09-05: 2 [IU] via SUBCUTANEOUS
  Administered 2017-09-06 (×2): 1 [IU] via SUBCUTANEOUS
  Administered 2017-09-07 (×2): 3 [IU] via SUBCUTANEOUS
  Administered 2017-09-07: 2 [IU] via SUBCUTANEOUS

## 2017-09-05 MED ORDER — STROKE: EARLY STAGES OF RECOVERY BOOK
Freq: Once | Status: AC
  Administered 2017-09-05: 18:00:00
  Filled 2017-09-05 (×2): qty 1

## 2017-09-05 MED ORDER — SUCRALFATE 1 G PO TABS
1.0000 g | ORAL_TABLET | Freq: Two times a day (BID) | ORAL | Status: DC
  Administered 2017-09-05 – 2017-09-11 (×12): 1 g via ORAL
  Filled 2017-09-05 (×12): qty 1

## 2017-09-05 NOTE — Code Documentation (Signed)
72 yo female coming from [facility] where she was admitted for chest pain. Pt came to ED for lumps to the left side of breast and abdomin. Pt noted to have elevated Troponin and kept for observation. Pt complained of 10/10 Chest pain at 11:45 today and was given three nitro. Pt had sudden onset of left facial droop, generalized weakness, and inability to follow commands or speak. Staff activated a Code Stroke. Stroke Team met patient on [facility]. Initial NIHSS 6.  Pt noted to have left facial droop, not following commands, aphasic, bilaterally weak. BP noted to be 75/45 at arrival. Admitting MD ordered 1000 mL NS bolus. Bolus started and patient transported to CT. CT Head negative for hemorrhage. Unable to completed CTA due to patient's kidney function. MRI ordered and patient brought to MRI. BP increasing. Two 20 Gauge IVs placed for patient to have access. MRI complete. No acute stroke at this time.Handoff given to 2 Therapist, nutritional. No Change in current admission status.

## 2017-09-05 NOTE — H&P (Signed)
History and Physical    Joann Gutierrez YQI:[id number] DOB: 12/25/1945 DOA: 09/04/2017  PCP: Seward Carol, MD  Patient coming from: Home.  Chief Complaint: Abdominal pain chest pain.  Some of the history was provided by patient's husband at the bedside since patient appears mildly confused and is a poor historian.  HPI: Joann Gutierrez is a 72 y.o. female with history of COPD, hypertension, chronic kidney disease, sleep apnea, diabetes mellitus, anemia was brought to the ER the patient was complaining of vague discomforts not clear if it is involving the epigastrium or chest.  The patient started saying that it was more of the retrosternal area.  Denies any nausea vomiting or diarrhea.  Denies any shortness of breath.  As per the husband the symptoms were there for last 24 hours.  ED Course: In the ER CT abdomen and pelvis was unremarkable.  Troponin is mildly elevated EKG showing chronic changes.  Since troponin is mildly elevated and patient's symptoms being being admitted for further observation to rule out ACS.  Review of Systems: As per HPI, rest all negative.   Past Medical History:  Diagnosis Date  . Adenomatous colon polyp   . Allergy   . Anemia   . Asthma       . CAD (coronary artery disease)    Mild very minimal coronary disease with 20% obtuse marginal stenosis  . Carpal tunnel syndrome on left   . CHF (congestive heart failure) ([place])   . Chronic kidney disease (CKD), stage III (moderate) (HCC)   . COPD (chronic obstructive pulmonary disease) ([place])   . CVA (cerebral infarction)   . Depression   . 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 Diverticulosis   . 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 Esophageal dysmotility   . Esophageal stricture   . Gastritis   . Gastroparesis 08/21/2007  . GERD (gastroesophageal reflux disease)   . Hair loss   . Hearing loss of both ears   . Hernia, hiatal   . Hyperlipidemia   . Hypertension   . Morbid obesity  ([place])   . Obesity   . Osteoarthritis   . OSTEOARTHRITIS 08/09/2006  . Osteoporosis   . 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 RENAL INSUFFICIENCY 02/16/2009  . 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 Seizures ([place])    pt thinks it has been several monthes since she had a seisure  . Shingles   . Sickle cell trait ([place])   . Stroke ([place])   . Tubular adenoma of colon     Past Surgical History:  Procedure Laterality Date  . ABDOMINAL HYSTERECTOMY    . ARTERY BIOPSY  01/07/2011   Procedure: MINOR BIOPSY TEMPORAL ARTERY;  Surgeon: Haywood Lasso, MD;  Location: [facility];  Service: General;  Laterality: Left;  left temporal artery biopsy  . bil foot surgery    . BREAST LUMPECTOMY     benign  . BREAST LUMPECTOMY     both breast lumps removed   . CATARACT EXTRACTION Left 06/2016   Dr. Read Drivers  . COLONOSCOPY    . COLONOSCOPY WITH PROPOFOL N/A 07/05/2015   Procedure: COLONOSCOPY WITH PROPOFOL;  Surgeon: Manus Gunning, MD;  Location: [facility];  Service: Gastroenterology;  Laterality: N/A;  . ERD  08/08/2000  . ESOPHAGEAL MANOMETRY N/A 03/13/2015   Procedure: ESOPHAGEAL MANOMETRY (EM);  Surgeon: Manus Gunning, MD;  Location: [facility];  Service: Gastroenterology;  Laterality: N/A;  .  ESOPHAGOGASTRODUODENOSCOPY  06/25/2006  . ESOPHAGOGASTRODUODENOSCOPY (EGD) WITH PROPOFOL N/A 07/05/2015   Procedure: ESOPHAGOGASTRODUODENOSCOPY (EGD) WITH PROPOFOL;  Surgeon: Manus Gunning, MD;  Location: [facility];  Service: Gastroenterology;  Laterality: N/A;  . HERNIA REPAIR    . LEFT AND RIGHT HEART CATHETERIZATION WITH CORONARY ANGIOGRAM N/A 03/03/2013   Procedure: LEFT AND RIGHT HEART CATHETERIZATION WITH CORONARY ANGIOGRAM;  Surgeon: Minus Breeding, MD;  Location[facility] CATH LAB;  Service: Cardiovascular;  Laterality: N/A;  . REPLACEMENT TOTAL KNEE Left 1998  . UPPER GASTROINTESTINAL ENDOSCOPY       reports that she quit smoking about 37 years  ago. She has a 5.00 pack-year smoking history. She has never used smokeless tobacco. She reports that she does not drink alcohol or use drugs.  Allergies  Allergen Reactions  . Morphine And Related Other (See Comments)    Family request not to be given, reports pt does not wake up when given   . Promethazine Hcl Other (See Comments)    REACTION: lethargy    Family History  Problem Relation Age of Onset  . Liver cancer Mother        Liver Cancer  . Diabetes Mother   . Kidney disease Mother   . Heart disease Mother        age 59's  . Heart disease Father 23       MI  . Heart attack Father        died of MI when pt was 16  . Sickle cell anemia Father   . Colon cancer Brother   . Cancer Brother        Colon Cancer  . Diabetes Sister   . Kidney disease Sister   . Heart disease Sister        age 77's  . Allergies Sister   . Diabetes Sister   . Kidney disease Sister   . Heart disease Sister        age 66's  . Esophageal cancer Neg Hx   . Rectal cancer Neg Hx   . Stomach cancer Neg Hx   . Amblyopia Neg Hx   . Blindness Neg Hx   . Glaucoma Neg Hx   . Macular degeneration Neg Hx   . Retinal detachment Neg Hx   . Cataracts Neg Hx   . Strabismus Neg Hx   . Retinitis pigmentosa Neg Hx     Prior to Admission medications   Medication Sig Start Date End Date Taking? Authorizing Provider  albuterol (PROVENTIL HFA;VENTOLIN HFA) 108 (90 Base) MCG/ACT inhaler Inhale 2 puffs into the lungs every 6 (six) hours as needed for wheezing or shortness of breath. 07/11/17  Yes [name], MD  aspirin EC 81 MG tablet Take 81 mg by mouth daily.   Yes [provider]  atorvastatin (LIPITOR) 40 MG tablet Take 40 mg by mouth daily at 6 PM.    Yes [provider]  calcium carbonate (OSCAL) 1500 (600 Ca) MG TABS tablet Take 600 mg of elemental calcium by mouth daily with breakfast.   Yes [provider]  carvedilol (COREG) 12.5 MG tablet Take 12.5 mg by mouth 2 (two)  times daily with a meal.   Yes [provider]  doxazosin (CARDURA) 4 MG tablet Take 4 mg by mouth at bedtime. 07/08/17  Yes [provider]  ezetimibe (ZETIA) 10 MG tablet Take 10 mg by mouth daily.    Yes [provider]  fluticasone (FLONASE) 50 MCG/ACT nasal spray USE TWO  SPRAYS INTO BOTH NOSTRILS DAILY 05/19/17  Yes Sood, Elisabeth Cara, MD  fluticasone furoate-vilanterol (BREO ELLIPTA) 100-25 MCG/INH AEPB Inhale 1 puff into the lungs daily. 07/11/17  Yes [name], MD  furosemide (LASIX) 80 MG tablet Take 1 tablet (80 mg total) by mouth daily. Patient taking differently: Take 80 mg by mouth 2 (two) times daily.  12/04/15  Yes Rai, Ripudeep K, MD  gabapentin (NEURONTIN) 300 MG capsule Take 1 capsule (300 mg total) by mouth 2 (two) times daily. 12/21/15  Yes Michel Bickers, MD  guaiFENesin (MUCINEX) 600 MG 12 hr tablet Take 2 tablets (1,200 mg total) by mouth 2 (two) times daily as needed for cough or to loosen phlegm. 10/31/16  Yes [name], MD  hydrALAZINE (APRESOLINE) 100 MG tablet Take 100 mg by mouth 3 (three) times daily.   Yes [provider]  isosorbide mononitrate (IMDUR) 120 MG 24 hr tablet Take 1 tablet (120 mg total) by mouth daily. PATIENT NEEDS TO CONTACT OFFICE FOR ADDITIONAL REFILLS 01/11/15  Yes Hochrein, Jeneen Rinks, MD  KLOR-CON M20 20 MEQ tablet Take 20 mEq by mouth daily.  06/19/11  Yes [provider]  LEVEMIR FLEXTOUCH 100 UNIT/ML Pen Inject 15 Units as directed every morning. 03/06/16  Yes [provider]  losartan (COZAAR) 100 MG tablet Take 100 mg by mouth daily.   Yes [provider]  meclizine (ANTIVERT) 25 MG tablet Take 1 tablet (25 mg total) by mouth 3 (three) times daily as needed for dizziness. 08/12/17  Yes Julianne Rice, MD  metolazone (ZAROXOLYN) 2.5 MG tablet Take 2.5 mg by mouth 3 (three) times a week.    Yes [provider]  mometasone-formoterol (DULERA) 100-5 MCG/ACT AERO Inhale 2 puffs into the  lungs 2 (two) times daily.   Yes [provider]  montelukast (SINGULAIR) 10 MG tablet TAKE ONE TABLET BY MOUTH ONCE DAILY Patient taking differently: TAKE ONE TABLET BY MOUTH AT BEDTIME 05/19/17  Yes [name], MD  nitroGLYCERIN (NITROSTAT) 0.4 MG SL tablet PLACE ONE TABLET UNDER THE TONGUE EVERY FIVE MINUTES AS NEEDED FOR CHEST PAIN 07/08/16  Yes Minus Breeding, MD  omeprazole (PRILOSEC) 20 MG capsule Take 1 capsule (20 mg total) by mouth 2 (two) times daily before a meal. 09/18/15 09/05/17 Yes Armbruster, Carlota Raspberry, MD  OXYGEN Inhale 2 L into the lungs continuous. CPAP with oxygen at bedtime    Yes [provider]  raloxifene (EVISTA) 60 MG tablet Take 60 mg by mouth every morning.    Yes [provider]  sucralfate (CARAFATE) 1 g tablet Take 1 g by mouth 2 (two) times daily.    Yes [provider]  TRUEPLUS PEN NEEDLES 32G X 4 MM MISC INJECT THREE TIMES A DAY AS DIRECTED 07/08/17  Yes [provider]  valACYclovir (VALTREX) 500 MG tablet Take 1 tablet (500 mg total) by mouth 3 (three) times daily. Patient taking differently: Take 1,000 mg by mouth 3 (three) times daily.  05/19/17  Yes Bernarda Caffey, MD  Vitamin D, Ergocalciferol, (DRISDOL) 50000 UNITS CAPS capsule Take 50,000 Units by mouth every Monday.    Yes [provider]  azelastine (ASTELIN) 0.1 % nasal spray Place 2 sprays into both nostrils 2 (two) times daily. Use in each nostril as directed Patient not taking: Reported on 08/12/2017 06/26/17   Lauraine Rinne, NP  ketorolac (ACULAR) 0.5 % ophthalmic solution PLACE ONE DROP INTO THE LEFT EYE FOUR TIMES A DAY Patient not taking: Reported on 08/12/2017 01/27/17  Bernarda Caffey, MD    Physical Exam: Vitals:   09/05/17 0100 09/05/17 0130 09/05/17 0151 09/05/17 0228  BP: (!) 187/82 (!) 193/76 (!) 163/86 (!) 151/88  Pulse: (!) 57 (!) 54 63 70  Resp: 15 12 20 15   Temp:    98.5 F (36.9 C)  TempSrc:    Oral  SpO2: 100% 100% 100% 100%    Weight:    119 kg  Height:    5' 4.5" (1.638 m)      Constitutional: Moderately built and nourished. Vitals:   09/05/17 0100 09/05/17 0130 09/05/17 0151 09/05/17 0228  BP: (!) 187/82 (!) 193/76 (!) 163/86 (!) 151/88  Pulse: (!) 57 (!) 54 63 70  Resp: 15 12 20 15   Temp:    98.5 F (36.9 C)  TempSrc:    Oral  SpO2: 100% 100% 100% 100%  Weight:    119 kg  Height:    5' 4.5" (1.638 m)   Eyes: Anicteric no pallor. ENMT: No discharge from the ears eyes nose or mouth. Neck: No mass or.  No neck rigidity.  No JVD appreciated. Respiratory: No rhonchi or crepitations. Cardiovascular: S1-S2 heard no murmurs appreciated. Abdomen: Soft nontender bowel sounds present. Musculoskeletal: No edema.  No joint effusion. Skin: No rash. Neurologic: Alert awake oriented to name and place appears mildly confused moves all extremities. Psychiatric: Appears normal.  Normal affect.   Labs on Admission: I have personally reviewed following labs and imaging studies  CBC: Recent Labs  Lab 09/04/17 2139  WBC 7.5  NEUTROABS 4.3  HGB 10.4*  HCT 34.8*  MCV 75.7*  PLT 580   Basic Metabolic Panel: Recent Labs  Lab 09/04/17 2139  NA 140  K 3.6  CL 103  CO2 27  GLUCOSE 133*  BUN 38*  CREATININE 1.84*  CALCIUM 8.9   GFR: Estimated Creatinine Clearance: 35.4 mL/min (A) (by C-G formula based on SCr of 1.84 mg/dL (H)). Liver Function Tests: Recent Labs  Lab 09/04/17 2139  AST 17  ALT 9  ALKPHOS 82  BILITOT 0.6  PROT 7.2  ALBUMIN 3.3*   Recent Labs  Lab 09/04/17 2139  LIPASE 28   No results for input(s): AMMONIA in the last 168 hours. Coagulation Profile: No results for input(s): INR, PROTIME in the last 168 hours. Cardiac Enzymes: Recent Labs  Lab 09/04/17 2139  TROPONINI 0.03*   BNP (last 3 results) No results for input(s): PROBNP in the last 8760 hours. HbA1C: No results for input(s): HGBA1C in the last 72 hours. CBG: Recent Labs  Lab 09/04/17 1558 09/04/17 2008  09/05/17 0229  GLUCAP 102* 133* 113*   Lipid Profile: No results for input(s): CHOL, HDL, LDLCALC, TRIG, CHOLHDL, LDLDIRECT in the last 72 hours. Thyroid Function Tests: No results for input(s): TSH, T4TOTAL, FREET4, T3FREE, THYROIDAB in the last 72 hours. Anemia Panel: No results for input(s): VITAMINB12, FOLATE, FERRITIN, TIBC, IRON, RETICCTPCT in the last 72 hours. Urine analysis:    Component Value Date/Time   COLORURINE YELLOW 09/04/2017 [place] 09/04/2017 1722   LABSPEC 1.012 09/04/2017 1722   PHURINE 5.0 09/04/2017 1722   GLUCOSEU NEGATIVE 09/04/2017 1722   GLUCOSEU >=1000 01/02/2009 1101   HGBUR NEGATIVE 09/04/2017 1722   HGBUR negative 03/25/2008 1022   BILIRUBINUR NEGATIVE 09/04/2017 1722   KETONESUR NEGATIVE 09/04/2017 1722   PROTEINUR 100 (A) 09/04/2017 1722   UROBILINOGEN 0.2 09/14/2013 1632   NITRITE NEGATIVE 09/04/2017 1722   LEUKOCYTESUR NEGATIVE 09/04/2017 1722   Sepsis  Labs: @LABRCNTIP (procalcitonin:4,lacticidven:4) )No results found for this or any previous visit (from the past 240 hour(s)).   Radiological Exams on Admission: Dg Chest 2 View  Result Date: 09/04/2017 CLINICAL DATA:  Dyspnea EXAM: CHEST - 2 VIEW COMPARISON:  08/12/2017 FINDINGS: Cardiomegaly with vascular congestion. Chronic elevation of right diaphragm. No pleural effusion. No focal airspace disease. IMPRESSION: Cardiomegaly with vascular congestion. Electronically Signed   By: Donavan Foil M.D.   On: 09/04/2017 21:58   Ct Abdomen Pelvis W Contrast  Result Date: 09/04/2017 CLINICAL DATA:  Abdominal pain palpable subcutaneous nodules EXAM: CT ABDOMEN AND PELVIS WITH CONTRAST TECHNIQUE: Multidetector CT imaging of the abdomen and pelvis was performed using the standard protocol following bolus administration of intravenous contrast. CONTRAST:  155mL OMNIPAQUE IOHEXOL 300 MG/ML  SOLN COMPARISON:  12/14/2015 FINDINGS: Lower chest: Lung bases are well aerated with the exception  of some stable right lower lobe scarring. Mild cardiomegaly is noted. Hepatobiliary: No focal liver abnormality is seen. No gallstones, gallbladder wall thickening, or biliary dilatation. Pancreas: Unremarkable. No pancreatic ductal dilatation or surrounding inflammatory changes. Spleen: Normal in size without focal abnormality. Adrenals/Urinary Tract: Adrenal glands are within normal limits. The kidneys are well visualized bilaterally. No obstructive changes or renal calculi are noted. The bladder is partially distended. Bilateral stable renal cysts are noted. Stomach/Bowel: Diverticular change of the colon is noted without evidence of diverticulitis. The appendix is not well visualized. No inflammatory changes to suggest appendicitis are noted. No obstructive changes are seen. Vascular/Lymphatic: Aortic atherosclerosis. No enlarged abdominal or pelvic lymph nodes. Reproductive: Status post hysterectomy. No adnexal masses. Other: No abdominal wall hernia or abnormality. No abdominopelvic ascites. Musculoskeletal: Degenerative changes of lumbar spine are noted. IMPRESSION: Diverticulosis without diverticulitis. Stable renal cysts. No acute abnormality noted. Electronically Signed   By: Inez Catalina M.D.   On: 09/04/2017 23:50    EKG: Independently reviewed.  Normal sinus rhythm with old anterior infarct.  Assessment/Plan Principal Problem:   Chest pain Active Problems:   Chronic obstructive asthma (HCC)   Gastroparesis   CKD (chronic kidney disease), stage III (HCC)   Essential hypertension   Insulin dependent diabetes mellitus ([place])   Morbid obesity due to excess calories ([place])   Hypertensive emergency   Anemia of chronic disease    1. Chest pain with mildly elevated troponin we will cycle cardiac markers.  Reviewed cardiology notes per Dr. Percival Spanish in 2015 showed that patient had unremarkable cardiac cath previously.  Patient is presently chest pain-free he does have elevated blood pressure  which could be contributing. 2. Uncontrolled hypertension -continue Coreg doxazosin Lasix hydralazine Imdur Cozaar Zaroxolyn.  Will keep patient on PRN IV hydralazine and closely follow blood pressure trends. 3. Diabetes mellitus type 2 on Levemir which is confirmed by patient's husband will keep patient on sliding scale coverage in addition. 4. Chronic kidney disease stage III creatinine appears to be at baseline.  Note that patient is on ARB and Lasix. 5. Abdominal discomfort CT abdomen unremarkable.  Patient has history of gastroparesis but denies any vomiting at this time.  Abdominal exam appears benign. 6. COPD not wheezing.  Continue inhalers. 7. Anemia of chronic kidney disease follow CBC. 8. Sleep apnea on CPAP.  Patient does have some memory issues will need further work-up as outpatient.   DVT prophylaxis: Lovenox. Code Status: Full code. Family Communication: Patient's husband. Disposition Plan: Home. Consults called: None. Admission status: Observation.   Rise Patience MD [facility] Pager [phone].  If 7PM-7AM, please contact  night-coverage [email or web address] Password [email or web address]  09/05/2017, 3:25 AM

## 2017-09-05 NOTE — ED Provider Notes (Signed)
Care assumed from previous provider Dr. Regenia Skeeter. Please see their note for further details to include full history and physical. To summarize in short pt is a 72 year old female presents to the ED for evaluation of abdominal pain. Case discussed, plan agreed upon.  At time of care handoff was awaiting lab work and CT imaging of abdomen.   CT shows diverticulosis without any diverticulitis and also notes renal cyst but no other acute findings.  Lab work shows stable hemoglobin.  Creatinine is elevated but at baseline.  No significant electrolyte derangement.  BNP is normal.  Patient's troponin is mildly elevated 0.03.  EKG shows no ischemic changes.  I did talk with patient.  She does report having some substernal chest pain today with shortness of breath.  Patient is on O2 at baseline.  Given the elevated troponin patient will need to be admitted for trending of cardiac enzymes and further work-up and assessment.  I spoke with Dr. Hal Hope with hospital medicine who agrees to admission will see patient in the ED and place admission orders.      Doristine Devoid, PA-C 09/05/17 0100    Sherwood Gambler, MD 09/05/17 1336

## 2017-09-05 NOTE — Progress Notes (Signed)
Speech eval completed. Pt's lung sounds remain the same after water - once pt attempted to swallow a piece of cracker she immediately started coughing. Lung sounds altered. Will keep NPO & order further evaluation.   [place]  [name] Flicker, RN

## 2017-09-05 NOTE — Consult Note (Addendum)
NEURO HOSPITALIST      Requesting Physician: Dr. Loleta Books    Chief Complaint:  Left facial droop  History obtained from:  Patient / Chart    HPI:                                                                                                                                         Joann Gutierrez is an 72 y.o. female  With PMH significant for CVA, HTN, HLD, DM, seizures, obesity, CAD, COPD presented to [facility] for CP. Code stroke was initiated on inpatient unit for left facial droop, difficulty speaking  Patient was admitted to hospital for abdominal pain and chest pain. Patient c/o 10/10 CP and was given 3 nitroglycerin.  Denies any SOB, N/V or diarrhea. Code stroke was called on the unit for sudden onset of facial droop and difficulty speaking and generalized weakness. Of note patient is a poor historian. Some confusion noted.   Hospital course: CT head:Questionable new hypodense lacune in the right pons, but may be artifact. 2. Otherwise stable chronic small vessel ischemia. No acute intracranial hemorrhage or cortically based infarct.  MRI: pending  Patient has had a previous CVA, unknown what year.    Date last known well: Date: 09/05/2017 Time last known well: Time: 11:50 tPA Given: No: established hypodensity on CT Head. Modified Rankin: Rankin Score=1  NIHSS:8 1a Level of Conscious:0 1b LOC Questions:1  1c LOC Commands: 0 2 Best Gaze: 0 3 Visual: 0 4 Facial Palsy: 1 5a Motor Arm - left: 1 5b Motor Arm - Right:1  6a Motor Leg - Left: 2 6b Motor Leg - Right: 2 7 Limb Ataxia: 0 8 Sensory: 0 9 Best Language: 0 10 Dysarthria:1 11 Extinct. and Inattention:0 TOTAL: 9   Past Medical History:  Diagnosis Date  . Adenomatous colon polyp   . Allergy   . Anemia   . Asthma       . CAD (coronary artery disease)    Mild very minimal coronary disease with 20% obtuse marginal stenosis  . Carpal tunnel syndrome on left    . CHF (congestive heart failure) ([place])   . Chronic kidney disease (CKD), stage III (moderate) (HCC)   . COPD (chronic obstructive pulmonary disease) ([place])   . CVA (cerebral infarction)   . Depression   . 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 Diverticulosis   . 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 Esophageal dysmotility   . Esophageal stricture   . Gastritis   . Gastroparesis 08/21/2007  . GERD (gastroesophageal reflux disease)   . Hair loss   .  Hearing loss of both ears   . Hernia, hiatal   . Hyperlipidemia   . Hypertension   . Morbid obesity ([place])   . Obesity   . Osteoarthritis   . OSTEOARTHRITIS 08/09/2006  . Osteoporosis   . 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 RENAL INSUFFICIENCY 02/16/2009  . 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 Seizures ([place])    pt thinks it has been several monthes since she had a seisure  . Shingles   . Sickle cell trait ([place])   . Stroke ([place])   . Tubular adenoma of colon     Past Surgical History:  Procedure Laterality Date  . ABDOMINAL HYSTERECTOMY    . ARTERY BIOPSY  01/07/2011   Procedure: MINOR BIOPSY TEMPORAL ARTERY;  Surgeon: Haywood Lasso, MD;  Location: [facility];  Service: General;  Laterality: Left;  left temporal artery biopsy  . bil foot surgery    . BREAST LUMPECTOMY     benign  . BREAST LUMPECTOMY     both breast lumps removed   . CATARACT EXTRACTION Left 06/2016   Dr. Read Drivers  . COLONOSCOPY    . COLONOSCOPY WITH PROPOFOL N/A 07/05/2015   Procedure: COLONOSCOPY WITH PROPOFOL;  Surgeon: Manus Gunning, MD;  Location: [facility];  Service: Gastroenterology;  Laterality: N/A;  . ERD  08/08/2000  . ESOPHAGEAL MANOMETRY N/A 03/13/2015   Procedure: ESOPHAGEAL MANOMETRY (EM);  Surgeon: Manus Gunning, MD;  Location: [facility];  Service: Gastroenterology;  Laterality: N/A;  . ESOPHAGOGASTRODUODENOSCOPY  06/25/2006  . ESOPHAGOGASTRODUODENOSCOPY (EGD) WITH PROPOFOL N/A  07/05/2015   Procedure: ESOPHAGOGASTRODUODENOSCOPY (EGD) WITH PROPOFOL;  Surgeon: Manus Gunning, MD;  Location: [facility];  Service: Gastroenterology;  Laterality: N/A;  . HERNIA REPAIR    . LEFT AND RIGHT HEART CATHETERIZATION WITH CORONARY ANGIOGRAM N/A 03/03/2013   Procedure: LEFT AND RIGHT HEART CATHETERIZATION WITH CORONARY ANGIOGRAM;  Surgeon: Minus Breeding, MD;  Location: [facility] CATH LAB;  Service: Cardiovascular;  Laterality: N/A;  . REPLACEMENT TOTAL KNEE Left 1998  . UPPER GASTROINTESTINAL ENDOSCOPY      Family History  Problem Relation Age of Onset  . Liver cancer Mother        Liver Cancer  . Diabetes Mother   . Kidney disease Mother   . Heart disease Mother        age 4's  . Heart disease Father 60       MI  . Heart attack Father        died of MI when pt was 48  . Sickle cell anemia Father   . Colon cancer Brother   . Cancer Brother        Colon Cancer  . Diabetes Sister   . Kidney disease Sister   . Heart disease Sister        age 41's  . Allergies Sister   . Diabetes Sister   . Kidney disease Sister   . Heart disease Sister        age 2's  . Esophageal cancer Neg Hx   . Rectal cancer Neg Hx   . Stomach cancer Neg Hx   . Amblyopia Neg Hx   . Blindness Neg Hx   . Glaucoma Neg Hx   . Macular degeneration Neg Hx   . Retinal detachment Neg Hx   . Cataracts Neg Hx   . Strabismus Neg Hx   . Retinitis pigmentosa Neg Hx          Social History:  reports that she quit smoking about 37 years ago. She has a 5.00 pack-year smoking history. She has never used smokeless tobacco. She reports that she does not drink alcohol or use drugs.  Allergies:  Allergies  Allergen Reactions  . Morphine And Related Other (See Comments)    Family request not to be given, reports pt does not wake up when given   . Promethazine Hcl Other (See Comments)    REACTION: lethargy    Medications:                                                                                                                            Scheduled: . aspirin EC  81 mg Oral Daily  . atorvastatin  40 mg Oral q1800  . calcium carbonate  500 mg of elemental calcium Oral Q breakfast  . carvedilol  12.5 mg Oral BID WC  . doxazosin  4 mg Oral QHS  . enoxaparin (LOVENOX) injection  40 mg Subcutaneous Q24H  . ezetimibe  10 mg Oral Daily  . fluticasone  2 spray Each Nare Daily  . fluticasone furoate-vilanterol  1 puff Inhalation Daily  . furosemide  80 mg Oral BID  . gabapentin  300 mg Oral BID  . hydrALAZINE  100 mg Oral TID  . insulin aspart  0-9 Units Subcutaneous TID WC  . insulin detemir  15 Units Subcutaneous Daily  . isosorbide mononitrate  120 mg Oral Daily  . losartan  100 mg Oral Daily  . metolazone  2.5 mg Oral Once per day on Mon Wed Fri  . montelukast  10 mg Oral QHS  . pantoprazole  40 mg Oral Daily  . potassium chloride SA  20 mEq Oral Daily  . sucralfate  1 g Oral BID   Continuous: . sodium chloride 1,000 mL (09/05/17 1221)   ZGY:[id number] **OR** acetaminophen, albuterol, guaiFENesin, hydrALAZINE, nitroGLYCERIN, ondansetron **OR** ondansetron (ZOFRAN) IV   ROS:                                                                                                                                       History obtained from unobtainable from patient due to mental status General Examination:  Blood pressure (!) 75/45, pulse 60, temperature 98.4 F (36.9 C), temperature source Oral, resp. rate (!) 21, height 5' 4.5" (1.638 m), weight 119 kg, SpO2 98 %.  HEENT-  Normocephalic, no lesions, without obvious abnormality.  Normal external eye and conjunctiva.  Cardiovascular- S1-S2 audible, pulses palpable throughout   Lungs-no rhonchi or wheezing noted, no excessive working breathing.  Saturations within normal limits on RA Abdomen- All 4 quadrants palpated RUQ  tender Extremities- Warm, dry and intact Musculoskeletal-no joint tenderness, deformity or swelling Skin-warm and dry, no hyperpigmentation, vitiligo, or suspicious lesions  Neurological Examination Mental Status: Alert,   Speech mildly dysarthric without evidence of aphasia.  Able to follow simple commands without difficulty.  Poor attention concentration.  Unable to follow complex commands. Cranial Nerves: TK:PTWSFK fields grossly normal,  III,IV, VI: ptosis not present, extra-ocular motions intact bilaterally, pupils equal, round, reactive to light and accommodation V,VII: smile symmetric but possible left facial droop at rest as evidenced by possible flattening of the nasolabial fold, facial light touch sensation normal bilaterally VIII: hearing normal bilaterally IX,X: uvula rises symmetrically XI: bilateral shoulder shrug XII: midline tongue extension Motor: Right : Upper extremity   4/5    Left:     Upper extremity   4/5  Lower extremity   3/5     Lower extremity   3/5 Tone and bulk:normal tone throughout; no atrophy noted Sensory:  Withdraws all 4 extremities to noxious stimuli Deep Tendon Reflexes: 2+ and symmetric biceps and patella Plantars: Right: downgoing   Left: downgoing Cerebellar: unable to perform HTS Gait: deferred   Lab Results: Basic Metabolic Panel: Recent Labs  Lab 09/04/17 2139 09/05/17 0339  NA 140 143  K 3.6 4.0  CL 103 106  CO2 27 26  GLUCOSE 133* 138*  BUN 38* 32*  CREATININE 1.84* 1.61*  CALCIUM 8.9 8.8*    CBC: Recent Labs  Lab 09/04/17 2139 09/05/17 0339  WBC 7.5 8.2  NEUTROABS 4.3 4.6  HGB 10.4* 10.7*  HCT 34.8* 36.4  MCV 75.7* 74.9*  PLT 200 210    Lipid Panel: No results for input(s): CHOL, TRIG, HDL, CHOLHDL, VLDL, LDLCALC in the last 168 hours.  CBG: Recent Labs  Lab 09/04/17 1558 09/04/17 2008 09/05/17 0229 09/05/17 0815 09/05/17 1220  GLUCAP 102* 133* 113* 172* 216*    Imaging: Dg Chest 2 View  Result  Date: 09/04/2017 CLINICAL DATA:  Dyspnea EXAM: CHEST - 2 VIEW COMPARISON:  08/12/2017 FINDINGS: Cardiomegaly with vascular congestion. Chronic elevation of right diaphragm. No pleural effusion. No focal airspace disease. IMPRESSION: Cardiomegaly with vascular congestion. Electronically Signed   By: Donavan Foil M.D.   On: 09/04/2017 21:58   Ct Abdomen Pelvis W Contrast  Result Date: 09/04/2017 CLINICAL DATA:  Abdominal pain palpable subcutaneous nodules EXAM: CT ABDOMEN AND PELVIS WITH CONTRAST TECHNIQUE: Multidetector CT imaging of the abdomen and pelvis was performed using the standard protocol following bolus administration of intravenous contrast. CONTRAST:  167mL OMNIPAQUE IOHEXOL 300 MG/ML  SOLN COMPARISON:  12/14/2015 FINDINGS: Lower chest: Lung bases are well aerated with the exception of some stable right lower lobe scarring. Mild cardiomegaly is noted. Hepatobiliary: No focal liver abnormality is seen. No gallstones, gallbladder wall thickening, or biliary dilatation. Pancreas: Unremarkable. No pancreatic ductal dilatation or surrounding inflammatory changes. Spleen: Normal in size without focal abnormality. Adrenals/Urinary Tract: Adrenal glands are within normal limits. The kidneys are well visualized bilaterally. No obstructive changes or renal calculi are noted. The bladder is partially distended. Bilateral stable  renal cysts are noted. Stomach/Bowel: Diverticular change of the colon is noted without evidence of diverticulitis. The appendix is not well visualized. No inflammatory changes to suggest appendicitis are noted. No obstructive changes are seen. Vascular/Lymphatic: Aortic atherosclerosis. No enlarged abdominal or pelvic lymph nodes. Reproductive: Status post hysterectomy. No adnexal masses. Other: No abdominal wall hernia or abnormality. No abdominopelvic ascites. Musculoskeletal: Degenerative changes of lumbar spine are noted. IMPRESSION: Diverticulosis without diverticulitis. Stable  renal cysts. No acute abnormality noted. Electronically Signed   By: Inez Catalina M.D.   On: 09/04/2017 23:50   Ct Head Code Stroke Wo Contrast  Result Date: 09/05/2017 CLINICAL DATA:  Code stroke. 72 year old female with altered mental status. EXAM: CT HEAD WITHOUT CONTRAST TECHNIQUE: Contiguous axial images were obtained from the base of the skull through the vertex without intravenous contrast. COMPARISON:  Brain MRI 08/12/2017. Head CT without contrast 10/24/2016, and earlier. FINDINGS: Brain: Stable cerebral volume since 2018. Patchy bilateral cerebral white matter hypodensity is stable. 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 Small chronic left thalamic lacune is evident. Questionable new small area of hypodensity in the right pons (series 3, image 12), but could be artifact. No midline shift, ventriculomegaly, mass effect, evidence of mass lesion, intracranial hemorrhage or evidence of cortically based acute infarction. Vascular: Calcified atherosclerosis at the skull base. No suspicious intracranial vascular hyperdensity. Skull: Stable, negative. Sinuses/Orbits: Visualized paranasal sinuses and mastoids are stable and well pneumatized. Other: Chronic postoperative changes to the left globe. No acute orbit or scalp soft tissue findings. Negative visible noncontrast deep soft tissue spaces of the face. ASPECTS [facility] Stroke Program Early CT Score) - Ganglionic level infarction (caudate, lentiform nuclei, internal capsule, insula, M1-M3 cortex): 7 - Supraganglionic infarction (M4-M6 cortex): 3 Total score (0-10 with 10 being normal): 10 IMPRESSION: 1. Questionable new hypodense lacune in the right pons, but may be artifact. 2. Otherwise stable chronic small vessel ischemia. No acute intracranial hemorrhage or cortically based infarct. 3. ASPECTS is 10. 4. These results were communicated to Dr. Rory Percy at 12:47 pmon 8/16/2019by text page via the [facility] messaging system.  Electronically Signed   By: Genevie [name] M.D.   On: 09/05/2017 12:47       Laurey Morale, MSN, NP-C [facility] [phone]  09/05/2017, 12:57 PM   Attending physician note to follow with Assessment and plan .  Attending addendum Patient seen and examined as an acute code stroke. Agree with history and physical documented above. I have reviewed the imaging personally. CT scan of the head shows no acute changes and has a questionable area of hypodensity in the right pons- new stroke versus artifact. MRI of the brain shows no evidence of acute stroke.  It shows possible chronic microhemorrhage in the area that was seen as the possible hypodensity in the right pons.  Assessment: 72 y.o. female With PMH significant for CVA, HTN, HLD, DM, seizures, obesity, CAD, COPD presented to [facility] for CP. Code stroke was initiated on inpatient unit for left facial droop, difficulty speaking. CT head: negative for hemorrhage.  Exam was not consistent with convincing left-sided weakness.  She was weak all over but there was definitely left facial weakness and some dysarthria. Patient was taken for stat MRI of the brain, which did not show any evidence of acute stroke. Symptoms related likely to toxic metabolic encephalopathy versus hypoperfusion leading to TIA or MRI negative stroke.  Not a candidate for IV TPA- MRI negative, delta NIH low. Not a candidate for endovascular-no cortical  signs.  Impression TIA in the setting of hypotension vs small MRI negative stroke Toxic metabolic encephalopathy.   Recommend --Normotensive BP goal --Check orthostatic vitals --Echocardiogram --HgbA1c, fasting lipid panel --PT consult, OT consult, Speech consult --Telemetry monitoring --Frequent neuro checks --Stroke swallow screen  --Check ammonia levels --Check carotid Dopplers-was unable to perform CT angiogram head and neck because of impaired renal function. --Aspirin 325  Stroke neurology service  will follow with you.   -- Amie Portland, MD [facility] Pager: [phone] If 7pm to 7am, please call on call as listed on AMION.

## 2017-09-05 NOTE — Care Management Obs Status (Signed)
[place] NOTIFICATION   Patient Details  Name: Joann Gutierrez MRN: [id number] Date of Birth: 01/14/1946   Medicare Observation Status Notification Given:     Husband signed for wife  Oneal Grout 09/05/2017, 2:43 PM

## 2017-09-05 NOTE — Care Management Note (Addendum)
Case Management Note  Patient Details  Name: Joann Gutierrez MRN: [id number] Date of Birth: 29-May-1945  Subjective/Objective:       Admitted with CP - suffered stroke while hosptialized            Action/Plan:  PTA from home with spouse.  Pt will need PT eval once medically stable.  CM will continue to follow   Expected Discharge Date:                  Expected Discharge Plan:  Skilled Nursing Facility(from home with husband)  In-House Referral:  Clinical Social Work  Discharge planning Services  CM Consult  Post Acute Care Choice:    Choice offered to:     DME Arranged:    DME Agency:     HH Arranged:    [place] Agency:     Status of Service:     If discussed at [employer], dates discussed:    Additional Comments: Husband informed CM that wife does ambulate with a cane in the home.  Pt has PCP  Maryclare Labrador, RN 09/05/2017, 1:39 PM

## 2017-09-05 NOTE — Progress Notes (Signed)
Pt. Transported to CT 

## 2017-09-05 NOTE — Progress Notes (Signed)
IV bolus of NS not given as there were issues with IV site once off unit. Current BP 125/60. Will hold fluids until MD responds. 2 new IVs placed by stroke team RN.   [place]  Darling Cieslewicz, RN

## 2017-09-05 NOTE — Progress Notes (Signed)
Stroke Recovery book given to pt's family; educated about signs/symptoms and what to do in an emergency.   [place]  Ohm Dentler, RN

## 2017-09-05 NOTE — Progress Notes (Signed)
Called to bedside for chest pain.  Patient admitted this morning for elevated troponin.  Evidnetly she initially presented for "knots on her breast and abdomen" and was in the fast-track when for some reason a troponin was obtained which was mininally elevated and so she was referred for observation overnight.  Evidently, she was well this morning until patient called nursing around 1145 for new onset severe 10/10 chest pain.  Took three nitro, no real change per nursing, then all of a sudden, became less responsive.  Nursing suspected left facial droop, left hemiparesis.  CODE STROKE called.    On my arrival: BP (!) 75/45   Pulse 60   Temp 98.4 F (36.9 C) (Oral)   Resp (!) 21   Ht 5' 4.5" (1.638 m)   Wt 119 kg   SpO2 98%   BMI 44.34 kg/m   Patient sluggish, less responsive, able to say she has chest pain.  Subtle left lip droop, does not follow commands.  She has leg tenderness severe, abdominal tenderness also severe (but nonfocal, no mass), normal Cor, HR normal, Resp normal and CTAB.  Mentation poor, sluggish responses.  No gaze preference.  When I press her legs, she first reaches with both arms.    Acute change in mental status Hypotension Chest pain Suspected hemiparesis, facial droop Suspect she had a vagal episode or exaggerated response to nitro.  Possible stroke, will eval with stat CT and Neuro consultation now.  ECG obtained by me, no STEMI.  Will continue cycling troponins, extend another 12 hours.  She had abdomen imaging yesterday that was normal.  She has severe aortic atherosclerosis but imaging 2 yaers ago and yesterday that shows no aneurysm, so I doubt AAA rupture.  Given hypotension and abdominal tenderness, will eval for free air or intraabdom hematoma with non-Con CT.          Please see H&P for additional details.

## 2017-09-05 NOTE — Progress Notes (Addendum)
Pt called out for chest pain. A&Ox4. Given 3 nitro. After second nitro pt couldn't answer what her pain level was. Couldn't answer any A&O questions. Stroke screen started as speech slurred, left side of face drooping, had left arm drift with weakness in both arms. Took vitals. BP very soft (pt history of HTN). Called MD, rapid response RN, and called a code stroke. Stroke RN present at bedside. BG checked: 216. Fluids started before pt left unit for CT. Report given to rapid/stroke team.   [place]  [name], RN

## 2017-09-06 ENCOUNTER — Observation Stay (HOSPITAL_COMMUNITY): Payer: Medicare Other

## 2017-09-06 ENCOUNTER — Other Ambulatory Visit (HOSPITAL_COMMUNITY): Payer: Medicare Other

## 2017-09-06 DIAGNOSIS — R072 Precordial pain: Secondary | ICD-10-CM

## 2017-09-06 DIAGNOSIS — D638 Anemia in other chronic diseases classified elsewhere: Secondary | ICD-10-CM | POA: Diagnosis not present

## 2017-09-06 DIAGNOSIS — N183 Chronic kidney disease, stage 3 (moderate): Secondary | ICD-10-CM | POA: Diagnosis not present

## 2017-09-06 DIAGNOSIS — I421 Obstructive hypertrophic cardiomyopathy: Secondary | ICD-10-CM

## 2017-09-06 DIAGNOSIS — J449 Chronic obstructive pulmonary disease, unspecified: Secondary | ICD-10-CM

## 2017-09-06 DIAGNOSIS — K3184 Gastroparesis: Secondary | ICD-10-CM

## 2017-09-06 DIAGNOSIS — N179 Acute kidney failure, unspecified: Secondary | ICD-10-CM | POA: Diagnosis not present

## 2017-09-06 DIAGNOSIS — Z794 Long term (current) use of insulin: Secondary | ICD-10-CM | POA: Diagnosis not present

## 2017-09-06 DIAGNOSIS — I952 Hypotension due to drugs: Secondary | ICD-10-CM | POA: Diagnosis not present

## 2017-09-06 DIAGNOSIS — R531 Weakness: Secondary | ICD-10-CM

## 2017-09-06 DIAGNOSIS — I10 Essential (primary) hypertension: Secondary | ICD-10-CM | POA: Diagnosis not present

## 2017-09-06 DIAGNOSIS — E119 Type 2 diabetes mellitus without complications: Secondary | ICD-10-CM | POA: Diagnosis not present

## 2017-09-06 LAB — TROPONIN I: Troponin I: <0.03 ng/mL (ref <0.03)

## 2017-09-06 LAB — GLUCOSE, CAPILLARY
GLUCOSE-CAPILLARY: 87 mg/dL (ref 70–99)
GLUCOSE-CAPILLARY: 263 mg/dL — AB (ref 70–99)
Glucose-Capillary: 135 mg/dL — ABNORMAL HIGH (ref 70–99)
Glucose-Capillary: 140 mg/dL — ABNORMAL HIGH (ref 70–99)

## 2017-09-06 LAB — BASIC METABOLIC PANEL
Anion gap: 12 (ref 5–15)
BUN: 38 mg/dL — ABNORMAL HIGH (ref 8–23)
CHLORIDE: 104 mmol/L (ref 98–111)
CO2: 27 mmol/L (ref 22–32)
CREATININE: 2.45 mg/dL — AB (ref 0.44–1.00)
Calcium: 8.7 mg/dL — ABNORMAL LOW (ref 8.9–10.3)
GFR calc non Af Amer: 19 mL/min — ABNORMAL LOW (ref >60)
GFR, EST AFRICAN AMERICAN: 22 mL/min — AB (ref >60)
Glucose, Bld: 84 mg/dL (ref 70–99)
Potassium: 4 mmol/L (ref 3.5–5.1)
Sodium: 143 mmol/L (ref 135–145)

## 2017-09-06 LAB — LIPID PANEL
CHOL/HDL RATIO: 6.4 ratio
Cholesterol: 238 mg/dL — ABNORMAL HIGH (ref 0–200)
HDL: 37 mg/dL — AB (ref >40)
LDL Cholesterol: 166 mg/dL — ABNORMAL HIGH (ref 0–99)
TRIGLYCERIDES: 174 mg/dL — AB (ref <150)
VLDL: 35 mg/dL (ref 0–40)

## 2017-09-06 LAB — SEDIMENTATION RATE: Sed Rate: 41 mm/hr — ABNORMAL HIGH (ref 0–22)

## 2017-09-06 LAB — HEMOGLOBIN A1C
Hgb A1c MFr Bld: 11.5 % — ABNORMAL HIGH (ref 4.8–5.6)
MEAN PLASMA GLUCOSE: 283.35 mg/dL

## 2017-09-06 MED ORDER — ASPIRIN EC 81 MG PO TBEC: 81.0000 mg | DELAYED_RELEASE_TABLET | Freq: Every day | ORAL | Status: DC

## 2017-09-06 MED ORDER — PNEUMOCOCCAL VAC POLYVALENT 25 MCG/0.5ML IJ INJ
0.5000 mL | INJECTION | INTRAMUSCULAR | Status: AC
  Administered 2017-09-07: 0.5 mL via INTRAMUSCULAR
  Filled 2017-09-06: qty 0.5

## 2017-09-06 MED ORDER — SODIUM CHLORIDE 0.9 % IV SOLN
INTRAVENOUS | Status: DC
  Administered 2017-09-06 – 2017-09-08 (×4): via INTRAVENOUS

## 2017-09-06 MED ORDER — LABETALOL HCL 5 MG/ML IV SOLN: 10.0000 mg | Freq: Four times a day (QID) | INTRAVENOUS | Status: DC | PRN

## 2017-09-06 MED ORDER — ENOXAPARIN SODIUM 30 MG/0.3ML ~~LOC~~ SOLN: 30.0000 mg | SUBCUTANEOUS | Status: DC

## 2017-09-06 NOTE — Evaluation (Signed)
Physical Therapy Evaluation Patient Details Name: MARIJAH LARRANAGA MRN: [id number] DOB: [birth year] Today's Date: 09/06/2017   History of Present Illness  Pt is a 72 y/o female admitted secondary to chest pain and weakness. MRI of the brain was negative. Pt found to have toxic metabolic encephalopathy versus TIA. PMH including but not limited to CAD, CHF, COPD, CKD, CVA, DM, HLD, HTN, seizures, sickle cell trait.    Clinical Impression  Pt presented supine in bed with HOB elevated, awake and willing to participate in therapy session. Prior to admission, pt reported that she ambulated with use of a cane and "sometimes" required assistance with ADLs. Pt lives in a single level home with four steps to enter with her husband who is able to be with her 24/7. Pt currently requires min-mod A for bed mobility, mod A for transfers and min A to take a few steps with RW. Pt very limited secondary to fatigue this session. All VSS throughout with pt on 2L of supplemental O2 via [place]. Pt would continue to benefit from skilled physical therapy services at this time while admitted and after d/c to address the below listed limitations in order to improve overall safety and independence with functional mobility.     Follow Up Recommendations SNF    Equipment Recommendations  None recommended by PT    Recommendations for Other Services       Precautions / Restrictions Precautions Precautions: Fall Restrictions Weight Bearing Restrictions: No      Mobility  Bed Mobility Overal bed mobility: Needs Assistance Bed Mobility: Supine to Sit;Sit to Supine     Supine to sit: Mod assist Sit to supine: Min assist   General bed mobility comments: increased time and effort, cueing for sequencing, use of bed rail, assist with bilateral LE movement off of and onto bed  Transfers Overall transfer level: Needs assistance Equipment used: Rolling walker (2 wheeled) Transfers: Sit to/from Stand Sit to Stand: Mod  assist         General transfer comment: increased time and effort, mod A to power into standing from EOB  Ambulation/Gait Ambulation/Gait assistance: Min assist   Assistive device: Rolling walker (2 wheeled)   Gait velocity: decreased   General Gait Details: pt able to take lateral steps towards her R towards HOB with RW and min A for stability; pt also able to take two steps forwards and backwards with RW and min A. pt greatly limited secondary to fatigue  Stairs            Wheelchair Mobility    Modified Rankin (Stroke Patients Only)       Balance Overall balance assessment: Needs assistance Sitting-balance support: Feet supported Sitting balance-[name]: Fair     Standing balance support: Bilateral upper extremity supported Standing balance-[name]: Poor                               Pertinent Vitals/Pain Pain Assessment: Faces Faces Pain Scale: Hurts little more Pain Location: L armpit Pain Descriptors / Indicators: Sore Pain Intervention(s): Monitored during session    Home Living Family/patient expects to be discharged to:: Private residence Living Arrangements: Spouse/significant other Available Help at Discharge: Family;Available 24 hours/day Type of Home: House Home Access: Stairs to enter Entrance Stairs-Rails: Psychiatric nurse of Steps: 4 Home Layout: One level Home Equipment: Clinical cytogeneticist - 2 wheels;Cane - single point      Prior Function Level of  Independence: Needs assistance   Gait / Transfers Assistance Needed: ambulates with SPC at all times  ADL's / Homemaking Assistance Needed: "sometimes" needs assistance with bathing/dressing        Hand Dominance        Extremity/Trunk Assessment   Upper Extremity Assessment Upper Extremity Assessment: Generalized weakness    Lower Extremity Assessment Lower Extremity Assessment: Generalized weakness       Communication    Communication: HOH  Cognition Arousal/Alertness: Awake/alert Behavior During Therapy: WFL for tasks assessed/performed Overall Cognitive Status: Impaired/Different from baseline Area of Impairment: Memory;Problem solving                     Memory: Decreased short-term memory       Problem Solving: Slow processing;Difficulty sequencing;Requires verbal cues        General Comments      Exercises     Assessment/Plan    PT Assessment Patient needs continued PT services  PT Problem List Decreased strength;Decreased activity tolerance;Decreased balance;Decreased mobility;Decreased coordination;Decreased knowledge of use of DME;Decreased safety awareness;Decreased knowledge of precautions       PT Treatment Interventions DME instruction;Gait training;Stair training;Functional mobility training;Therapeutic activities;Therapeutic exercise;Balance training;Neuromuscular re-education;Patient/family education    PT Goals (Current goals can be found in the Care Plan section)  Acute Rehab PT Goals Patient Stated Goal: to get stronger PT Goal Formulation: With patient Time For Goal Achievement: 09/20/17 Potential to Achieve Goals: Good    Frequency Min 2X/week   Barriers to discharge        Co-evaluation               AM-PAC PT "6 Clicks" Daily Activity  Outcome Measure Difficulty turning over in bed (including adjusting bedclothes, sheets and blankets)?: Unable Difficulty moving from lying on back to sitting on the side of the bed? : Unable Difficulty sitting down on and standing up from a chair with arms (e.g., wheelchair, bedside commode, etc,.)?: Unable Help needed moving to and from a bed to chair (including a wheelchair)?: A Little Help needed walking in hospital room?: A Lot Help needed climbing 3-5 steps with a railing? : Total 6 Click Score: 9    End of Session Equipment Utilized During Treatment: Gait belt Activity Tolerance: Patient limited by  fatigue Patient left: in bed;with call bell/phone within reach;Other (comment)(vascular staff present to perform procedure) Nurse Communication: Mobility status PT Visit Diagnosis: Other abnormalities of gait and mobility (R26.89)    Time: 8841-6606 PT Time Calculation (min) (ACUTE ONLY): 18 min   Charges:   PT Evaluation $PT Eval Moderate Complexity: [place], [place], [place]   Slater 09/06/2017, 11:54 AM

## 2017-09-06 NOTE — Progress Notes (Signed)
PROGRESS NOTE    Joann Gutierrez  QAS:[id number] DOB: 04-12-1945 DOA: 09/04/2017 PCP: Seward Carol, MD      Brief Narrative:  Joann Gutierrez is a 72 y.o. F with complex PMHx, COPD not on home O2, HTN, CKD III baseline Cr 1.8, dCHF, OSA, DM, chronic anemia, gastroparesis, and chronic widespread pain complaints who presented initially with complaints of "'knots' on her left neck, under left breast, and on left side of abdomen" for several weeks, then family added reports of "more confused than usually" and "mild odor" to urine, then to second ED provider reported abdominal pain "like there is a mass in her left upper quadrant and right lower quadrant.  Ongoing for 2 weeks".  As a result of her abdominal pain, a troponin was obtained that was 0.03 ng/mL, after which the patient was asked a third time if she had chest pain and this time said yes (had been previously documented as having no chest pain), and so TRH were asked to observe for cardiac rule out.  Shortly after admission to the hospital, the patient had an episode of hypotension and altered mental status.  CODE STROKE was called by nursing, and Neurology were consulted.        Assessment & Plan:  Chest pain This is clearly reproducible on exam.  Cardiology believe this is chest wall pain from arthritis, which is certainly possible.  She actually appears to have widespread soft tissue pain and weakness.  The differential for this would probably be fibromyalgia, statin-induced myopathy, and inflammatory myopathies.  Former seems unlikely to develop this stage in life I would think, and latter seem unlikely with normal CK.   -Trial off statin, follow up with PCP -Check TSH, ESR, ANA   Abdominal pain Again, the patient has widespread soft tissue pain.  Her LFTs are normal and CT abdomen on admission and then again yesterday in context of her hypotension were completely normal. The etiology of this is unclear, but no indications  to suggest infection, intra-abdominal perforation, peritonitis or hemorrhage.  Hypotension, transient This occurred yesterday in the following context: patient complained to nursing of chest pain, was given nitro x2 which did not relieve her pain, followed by which she had hypotension, became less responsive globally, and CODE STROKE was called.  Her hypotension resolved spontaneously without fluids or other intervention and in fact she became hypertensive again last night when her oral medications were held.  Elevated troponin ACS is not present.  Cardiology were consulted, they recommend echocardiogram. -Follow echocardiogram  Possible TIA Her episode of decreased responsiveness yesterday occurred in the context of hypotension, and appeared to me to be simple presyncope.  However, I agree that TIA is not possible to rule out, given nursing initial observation of left sided weakness. -MRI negative for stroke -Echocardiogram pending -Carotid imaging unremarkable -Lipids ordered, LDL elevated, but statin contraindicated with muscle pain -Aspirin ordered   -Atrial fibrillation: not present -tPA not given because symptoms resolved -Dysphagia screen ordered  -PT eval ordered  COPD  No active disease -Continue home ICS/Laba, Flonase -Bronchodilators if needed -Continue montelukast -Continue PPI  Hypertension -Hold losartan, Lasix, metolazone -Continue doxazosin, carvedilol, hydralazine, Imdur -Continue Zetia -Trial off statin (as above)  Chronic abdominal pain -Continue sucralfate  Diabetes -Continue Levemir -Continue SSI   Anemia of chronic kidney disease Stable Hgb  Acute kidney injury Baseline Cr 1.6, today 2.45, nearly doubled.   -Hold losartan, diuretics -IV fluids -Trend BMP     DVT prophylaxis: Enoxaparin Code  Status: FULL Family Communication: Husband at bedside MDM and disposition Plan: The below labs and imaging reports were reviewed and summarized above.   Medication management as above.  The patient was admitted with skin knots, found to have unexplained troponin elevation.  While in observation, developed hypotensive episode then acute kidney injury.  Now needs IV fluids and close monitoring renal function.  Contninue inpatient care.   Consultants:   Cardiology  Neurology  Procedures:   CT head  MRI head  Carotid US  Echocardiogram    Antimicrobials:   None    Subjective: Feeling more alert.  Chewed on the inside of her lip last night, sore today.  Still has widespread pain in legs, chest, abdomen, these are very tender to palpation. She is globally weak, but no focal weakness, slurred speech.  No confusion.   Objective: Vitals:   09/06/17 0400 09/06/17 0739 09/06/17 0755 09/06/17 1152  BP: (!) 148/74 (!) 156/76  (!) 135/55  Pulse: (!) 56 (!) 58 65 69  Resp: '16 15 19 15  ' Temp: 97.6 F (36.4 C) 98.2 F (36.8 C)  97.9 F (36.6 C)  TempSrc: Oral Oral  Oral  SpO2: 99% 97% 98% 97%  Weight:      Height:        Intake/Output Summary (Last 24 hours) at 09/06/2017 1433 Last data filed at 09/06/2017 1031 Gross per 24 hour  Intake 555.49 ml  Output -  Net 555.49 ml   Filed Weights   09/04/17 1959 09/05/17 0228  Weight: 121.1 kg 119 kg    Examination: General appearance: obese adult female, awake and alert and in no acute distress.   HEENT: Anicteric, conjunctiva pink, lids and lashes normal. No nasal deformity, discharge, epistaxis.  Lips moist, dentures in place, OP moist, tiny cuts on inside upper lip, no tongue biting, hearing diminished.   Skin: Warm and dry.  No suspicious rashes or lesions on face, chest, arms, back, legs.  I do not appreciate any knots Cardiac: RRR, nl S1-S2, no murmurs appreciated.  Capillary refill is brisk.  JVP not visible.  No LE edema.  Radia  pulses 2+ and symmetric. Respiratory: Normal respiratory rate and rhythm.  CTAB without rales or wheezes. Abdomen: Abdomen soft.  no TTP. No  ascites, distension, hepatosplenomegaly.   MSK: No deformities or effusions. Neuro: Awake and alert.  EOMI, moves all extremities. Speech fluent.    Psych: Sensorium intact and responding to questions, attention attention. Affect flat.  Judgment and insight appear impaired.    Data Reviewed: I have personally reviewed following labs and imaging studies:  CBC: Recent Labs  Lab 09/04/17 2139 09/05/17 0339  WBC 7.5 8.2  NEUTROABS 4.3 4.6  HGB 10.4* 10.7*  HCT 34.8* 36.4  MCV 75.7* 74.9*  PLT 200 010   Basic Metabolic Panel: Recent Labs  Lab 09/04/17 2139 09/05/17 0339 09/06/17 0308  NA 140 143 143  K 3.6 4.0 4.0  CL 103 106 104  CO2 '27 26 27  ' GLUCOSE 133* 138* 84  BUN 38* 32* 38*  CREATININE 1.84* 1.61* 2.45*  CALCIUM 8.9 8.8* 8.7*   GFR: Estimated Creatinine Clearance: 26.6 mL/min (A) (by C-G formula based on SCr of 2.45 mg/dL (H)). Liver Function Tests: Recent Labs  Lab 09/04/17 2139 09/05/17 0339  AST 17 14*  ALT 9 8  ALKPHOS 82 85  BILITOT 0.6 0.9  PROT 7.2 7.0  ALBUMIN 3.3* 3.2*   Recent Labs  Lab 09/04/17 2139  LIPASE 28  Recent Labs  Lab 09/05/17 1538  AMMONIA 36*   Coagulation Profile: No results for input(s): INR, PROTIME in the last 168 hours. Cardiac Enzymes: Recent Labs  Lab 09/05/17 0339 09/05/17 0931 09/05/17 1538 09/05/17 2103 09/06/17 0308  CKTOTAL  --   --  68  --   --   TROPONINI <0.03 <0.03 <0.03 <0.03 <0.03   BNP (last 3 results) No results for input(s): PROBNP in the last 8760 hours. HbA1C: Recent Labs    09/06/17 0308  HGBA1C 11.5*   CBG: Recent Labs  Lab 09/05/17 1220 09/05/17 1716 09/05/17 2114 09/06/17 0742 09/06/17 1155  GLUCAP 216* 210* 71 87 135*   Lipid Profile: Recent Labs    09/06/17 0308  CHOL 238*  HDL 37*  LDLCALC 166*  TRIG 174*  CHOLHDL 6.4   Thyroid Function Tests: Recent Labs    09/05/17 0339  TSH 2.748   Anemia Panel: No results for input(s): VITAMINB12, FOLATE, FERRITIN,  TIBC, IRON, RETICCTPCT in the last 72 hours. Urine analysis:    Component Value Date/Time   COLORURINE YELLOW 09/04/2017 1722   APPEARANCEUR CLEAR 09/04/2017 1722   LABSPEC 1.012 09/04/2017 1722   PHURINE 5.0 09/04/2017 1722   GLUCOSEU NEGATIVE 09/04/2017 1722   GLUCOSEU >=1000 01/02/2009 1101   HGBUR NEGATIVE 09/04/2017 1722   HGBUR negative 03/25/2008 1022   BILIRUBINUR NEGATIVE 09/04/2017 1722   KETONESUR NEGATIVE 09/04/2017 1722   PROTEINUR 100 (A) 09/04/2017 1722   UROBILINOGEN 0.2 09/14/2013 1632   NITRITE NEGATIVE 09/04/2017 1722   LEUKOCYTESUR NEGATIVE 09/04/2017 1722   Sepsis Labs: '@LABRCNTIP' (procalcitonin:4,lacticacidven:4)  ) Recent Results (from the past 240 hour(s))  MRSA PCR Screening     Status: None   Collection Time: 09/05/17  2:42 AM  Result Value Ref Range Status   MRSA by PCR NEGATIVE NEGATIVE Final    Comment:        The GeneXpert MRSA Assay (FDA approved for NASAL specimens only), is one component of a comprehensive MRSA colonization surveillance program. It is not intended to diagnose MRSA infection nor to guide or monitor treatment for MRSA infections. Performed at [facility], [place] [address] [place], [place] [address]          Radiology Studies: Ct Abdomen Pelvis Wo Contrast  Result Date: 09/05/2017 CLINICAL DATA:  72 y/o  F; abdominal distention. EXAM: CT ABDOMEN AND PELVIS WITHOUT CONTRAST TECHNIQUE: Multidetector CT imaging of the abdomen and pelvis was performed following the standard protocol without IV contrast. COMPARISON:  09/04/2017 CT abdomen and pelvis. FINDINGS: Lower chest: Mild cardiomegaly. Small focus of consolidation within the basilar right lower lobe may represent atelectasis or pneumonia. Hepatobiliary: No focal liver abnormality is seen. No gallstones, gallbladder wall thickening, or biliary dilatation. Excretion of contrast into the gallbladder. Pancreas: Unremarkable. No pancreatic ductal dilatation or  surrounding inflammatory changes. Spleen: Normal in size without focal abnormality. Adrenals/Urinary Tract: Adrenal glands are unremarkable. Multiple renal cysts measuring up to 3.9 cm in the left kidney lower pole. Otherwise kidneys are normal, without renal calculi, focal lesion, or hydronephrosis. Bladder is unremarkable. Contrast excretion into the renal collecting systems. Stomach/Bowel: Stomach is within normal limits. Appendix not identified, no pericecal inflammation. No evidence of bowel wall thickening, distention, or inflammatory changes. Diverticulosis without findings of acute diverticulitis. Vascular/Lymphatic: Aortic atherosclerosis. No enlarged abdominal or pelvic lymph nodes. Reproductive: Status post hysterectomy. No adnexal masses. Other: No abdominal wall hernia or abnormality. No abdominopelvic ascites. Musculoskeletal: No fracture is seen. IMPRESSION: 1. Small focus of  consolidation within the basilar right lower lobe may represent atelectasis or pneumonia. 2. No acute process of the abdomen and pelvis identified. 3. Mild cardiomegaly. 4. Diverticulosis without findings of acute diverticulitis. 5. Aortic atherosclerosis. Electronically Signed   By: Kristine Garbe M.D.   On: 09/05/2017 20:51   Dg Chest 2 View  Result Date: 09/04/2017 CLINICAL DATA:  Dyspnea EXAM: CHEST - 2 VIEW COMPARISON:  08/12/2017 FINDINGS: Cardiomegaly with vascular congestion. Chronic elevation of right diaphragm. No pleural effusion. No focal airspace disease. IMPRESSION: Cardiomegaly with vascular congestion. Electronically Signed   By: Donavan Foil M.D.   On: 09/04/2017 21:58   Mr Brain Wo Contrast  Result Date: 09/05/2017 CLINICAL DATA:  Stroke follow-up. Altered mental status. Hemiparesis and facial droop. EXAM: MRI HEAD WITHOUT CONTRAST TECHNIQUE: Multiplanar, multiecho pulse sequences of the brain and surrounding structures were obtained without intravenous contrast. COMPARISON:  Head CT  09/05/2017.  Brain MRI 08/12/2017. FINDINGS: BRAIN: There is no acute infarct, acute hemorrhage or mass effect. The midline structures are normal. There is an old left cerebellar infarct. Diffuse confluent hyperintense T2-weighted signal within the periventricular, deep and juxtacortical white matter, most commonly due to chronic ischemic microangiopathy. Generalized atrophy without lobar predilection. Single focus of chronic microhemorrhage in the right brainstem unchanged from prior MRI. VASCULAR: Major intracranial arterial and venous sinus flow voids are preserved. SKULL AND UPPER CERVICAL SPINE: The visualized skull base, calvarium, upper cervical spine and extracranial soft tissues are normal. SINUSES/ORBITS: No fluid levels or advanced mucosal thickening. No mastoid or middle ear effusion. The orbits are normal. IMPRESSION: 1. No acute intracranial abnormality. 2. 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 3. Single focus of chronic microhemorrhage in the right brainstem may correspond to the area gliosis seen on the head CT. This is unchanged from 08/12/2017. Electronically Signed   By: Ulyses Jarred M.D.   On: 09/05/2017 13:55   Ct Abdomen Pelvis W Contrast  Result Date: 09/04/2017 CLINICAL DATA:  Abdominal pain palpable subcutaneous nodules EXAM: CT ABDOMEN AND PELVIS WITH CONTRAST TECHNIQUE: Multidetector CT imaging of the abdomen and pelvis was performed using the standard protocol following bolus administration of intravenous contrast. CONTRAST:  145m OMNIPAQUE IOHEXOL 300 MG/ML  SOLN COMPARISON:  12/14/2015 FINDINGS: Lower chest: Lung bases are well aerated with the exception of some stable right lower lobe scarring. Mild cardiomegaly is noted. Hepatobiliary: No focal liver abnormality is seen. No gallstones, gallbladder wall thickening, or biliary dilatation. Pancreas: Unremarkable. No pancreatic ductal dilatation or surrounding inflammatory changes. Spleen: Normal in size  without focal abnormality. Adrenals/Urinary Tract: Adrenal glands are within normal limits. The kidneys are well visualized bilaterally. No obstructive changes or renal calculi are noted. The bladder is partially distended. Bilateral stable renal cysts are noted. Stomach/Bowel: Diverticular change of the colon is noted without evidence of diverticulitis. The appendix is not well visualized. No inflammatory changes to suggest appendicitis are noted. No obstructive changes are seen. Vascular/Lymphatic: Aortic atherosclerosis. No enlarged abdominal or pelvic lymph nodes. Reproductive: Status post hysterectomy. No adnexal masses. Other: No abdominal wall hernia or abnormality. No abdominopelvic ascites. Musculoskeletal: Degenerative changes of lumbar spine are noted. IMPRESSION: Diverticulosis without diverticulitis. Stable renal cysts. No acute abnormality noted. Electronically Signed   By: MInez CatalinaM.D.   On: 09/04/2017 23:50   Ct Head Code Stroke Wo Contrast  Result Date: 09/05/2017 CLINICAL DATA:  Code stroke. 72year old female with altered mental status. EXAM: CT HEAD WITHOUT CONTRAST TECHNIQUE: Contiguous axial images were obtained from the base of  the skull through the vertex without intravenous contrast. COMPARISON:  Brain MRI 08/12/2017. Head CT without contrast 10/24/2016, and earlier. FINDINGS: Brain: Stable cerebral volume since 2018. Patchy bilateral cerebral white matter hypodensity is stable. 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 Small chronic left thalamic lacune is evident. Questionable new small area of hypodensity in the right pons (series 3, image 12), but could be artifact. No midline shift, ventriculomegaly, mass effect, evidence of mass lesion, intracranial hemorrhage or evidence of cortically based acute infarction. Vascular: Calcified atherosclerosis at the skull base. No suspicious intracranial vascular hyperdensity. Skull: Stable, negative. Sinuses/Orbits:  Visualized paranasal sinuses and mastoids are stable and well pneumatized. Other: Chronic postoperative changes to the left globe. No acute orbit or scalp soft tissue findings. Negative visible noncontrast deep soft tissue spaces of the face. ASPECTS [facility] Stroke Program Early CT Score) - Ganglionic level infarction (caudate, lentiform nuclei, internal capsule, insula, M1-M3 cortex): 7 - Supraganglionic infarction (M4-M6 cortex): 3 Total score (0-10 with 10 being normal): 10 IMPRESSION: 1. Questionable new hypodense lacune in the right pons, but may be artifact. 2. Otherwise stable chronic small Gutierrez ischemia. No acute intracranial hemorrhage or cortically based infarct. 3. ASPECTS is 10. 4. These results were communicated to Dr. Rory Percy at 12:47 pmon 8/16/2019by text page via the [facility] messaging system. Electronically Signed   By: Genevie [name] M.D.   On: 09/05/2017 12:47        Scheduled Meds: . aspirin EC  81 mg Oral Daily  . calcium carbonate  500 mg of elemental calcium Oral Q breakfast  . carvedilol  12.5 mg Oral BID WC  . doxazosin  4 mg Oral QHS  . enoxaparin (LOVENOX) injection  40 mg Subcutaneous Q24H  . ezetimibe  10 mg Oral Daily  . fluticasone  2 spray Each Nare Daily  . fluticasone furoate-vilanterol  1 puff Inhalation Daily  . gabapentin  300 mg Oral BID  . hydrALAZINE  100 mg Oral TID  . insulin aspart  0-9 Units Subcutaneous TID WC  . insulin detemir  15 Units Subcutaneous Daily  . isosorbide mononitrate  120 mg Oral Daily  . montelukast  10 mg Oral QHS  . pantoprazole  40 mg Oral Daily  . [START ON 09/07/2017] pneumococcal 23 valent vaccine  0.5 mL Intramuscular Tomorrow-1000  . potassium chloride SA  20 mEq Oral Daily  . sucralfate  1 g Oral BID   Continuous Infusions: . sodium chloride 125 mL/hr at 09/06/17 0809  . sodium chloride       LOS: 0 days    Time spent: 25 minutes    Edwin Dada, MD [facility] 09/06/2017, 2:33 PM     Pager  [phone] --- please page though AMION:  [email or web address] If 7PM-7AM, please contact night-coverage

## 2017-09-06 NOTE — Progress Notes (Signed)
VASCULAR LAB PRELIMINARY  PRELIMINARY  PRELIMINARY  PRELIMINARY  Carotid duplex completed.    Preliminary report:  1-39% ICA plaquing. Vertebral artery flow is antegrade.   Tyge Somers, RVT 09/06/2017, 9:57 AM

## 2017-09-06 NOTE — Evaluation (Signed)
Clinical/Bedside Swallow Evaluation Patient Details  Name: Joann Gutierrez MRN: [id number] Date of Birth: [birth year]  Today's Date: 09/06/2017 Time: SLP Start Time (ACUTE ONLY): 0848 SLP Stop Time (ACUTE ONLY): 0903 SLP Time Calculation (min) (ACUTE ONLY): 15 min  Past Medical History:  Past Medical History:  Diagnosis Date  . Adenomatous colon polyp   . Allergy   . Anemia   . Asthma       . CAD (coronary artery disease)    Mild very minimal coronary disease with 20% obtuse marginal stenosis  . Carpal tunnel syndrome on left   . CHF (congestive heart failure) ([place])   . Chronic kidney disease (CKD), stage III (moderate) ([facility])   . COPD (chronic obstructive pulmonary disease) ([place])   . CVA (cerebral infarction)   . Depression   . 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 Diverticulosis   . 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 Esophageal dysmotility   . Esophageal stricture   . Gastritis   . Gastroparesis 08/21/2007  . GERD (gastroesophageal reflux disease)   . Hair loss   . Hearing loss of both ears   . Hernia, hiatal   . Hyperlipidemia   . Hypertension   . Morbid obesity ([place])   . Obesity   . Osteoarthritis   . OSTEOARTHRITIS 08/09/2006  . Osteoporosis   . 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 RENAL INSUFFICIENCY 02/16/2009  . 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 Seizures ([place])    pt thinks it has been several monthes since she had a seisure  . Shingles   . Sickle cell trait ([place])   . Stroke ([place])   . Tubular adenoma of colon    Past Surgical History:  Past Surgical History:  Procedure Laterality Date  . ABDOMINAL HYSTERECTOMY    . ARTERY BIOPSY  01/07/2011   Procedure: MINOR BIOPSY TEMPORAL ARTERY;  Surgeon: Haywood Lasso, MD;  Location: [facility];  Service: General;  Laterality: Left;  left temporal artery biopsy  . bil foot surgery    . BREAST LUMPECTOMY     benign  . BREAST LUMPECTOMY     both breast lumps  removed   . CATARACT EXTRACTION Left 06/2016   Dr. Read Drivers  . COLONOSCOPY    . COLONOSCOPY WITH PROPOFOL N/A 07/05/2015   Procedure: COLONOSCOPY WITH PROPOFOL;  Surgeon: Manus Gunning, MD;  Location: [facility];  Service: Gastroenterology;  Laterality: N/A;  . ERD  08/08/2000  . ESOPHAGEAL MANOMETRY N/A 03/13/2015   Procedure: ESOPHAGEAL MANOMETRY (EM);  Surgeon: Manus Gunning, MD;  Location: [facility];  Service: Gastroenterology;  Laterality: N/A;  . ESOPHAGOGASTRODUODENOSCOPY  06/25/2006  . ESOPHAGOGASTRODUODENOSCOPY (EGD) WITH PROPOFOL N/A 07/05/2015   Procedure: ESOPHAGOGASTRODUODENOSCOPY (EGD) WITH PROPOFOL;  Surgeon: Manus Gunning, MD;  Location: [facility];  Service: Gastroenterology;  Laterality: N/A;  . HERNIA REPAIR    . LEFT AND RIGHT HEART CATHETERIZATION WITH CORONARY ANGIOGRAM N/A 03/03/2013   Procedure: LEFT AND RIGHT HEART CATHETERIZATION WITH CORONARY ANGIOGRAM;  Surgeon: Minus Breeding, MD;  Location: [facility];  Service: Cardiovascular;  Laterality: N/A;  . REPLACEMENT TOTAL KNEE Left 1998  . UPPER GASTROINTESTINAL ENDOSCOPY     HPI:  72 year old female with history of COPD, hypertension, chronic kidney disease, sleep apnea, diabetes mellitus, anemia, esophageal dysmotility, esophageal stricture, GERD, gastritis, gastroparesis, CVA, initially admitted for chest pain. Developed new onset left sided facial droop, generalized weakness, and inability to follow  commands or speak on 8/16. CT of the head negative. MRI negative.    Assessment / Plan / Recommendation Clinical Impression  Patient presents with normal oropharyngeal swallowing function. No overt indication of aspiration. Recommend a regular diet, thin liquid with general safe swallowing and esophageal precautions given GI history. Note that patient is a poor historian and requires intermittent cueing to follow complex commands. Per patient and spouse, cognitive-linguistic function has  returned to baseline since event 8/16, Will defer SLE. No SLP f/u indicated at this time,  SLP Visit Diagnosis: Dysphagia, unspecified (R13.10)    Aspiration Risk  Mild aspiration risk    Diet Recommendation Regular;Thin liquid   Liquid Administration via: Cup;Straw Medication Administration: Whole meds with liquid Supervision: Patient able to self feed Compensations: Slow rate;Small sips/bites Postural Changes: Seated upright at 90 degrees;Remain upright for at least 30 minutes after po intake    Other  Recommendations Oral Care Recommendations: Oral care BID   Follow up Recommendations None        Swallow Study   General HPI: 72 year old female with history of COPD, hypertension, chronic kidney disease, sleep apnea, diabetes mellitus, anemia, esophageal dysmotility, esophageal stricture, GERD, gastritis, gastroparesis, CVA, initially admitted for chest pain. Developed new onset left sided facial droop, generalized weakness, and inability to follow commands or speak on 8/16. CT of the head negative. MRI negative.  Type of Study: Bedside Swallow Evaluation Previous Swallow Assessment: h/o esophageal dysmotility, most recent barium swallow in 2016 Diet Prior to this Study: NPO Temperature Spikes Noted: No Respiratory Status: Room air History of Recent Intubation: No Behavior/Cognition: Alert;Cooperative;Pleasant mood Oral Cavity Assessment: Within Functional Limits Oral Care Completed by SLP: Yes Oral Cavity - Dentition: Dentures, top;Dentures, bottom Vision: Functional for self-feeding Self-Feeding Abilities: Able to feed self Patient Positioning: Upright in bed Baseline Vocal Quality: Normal Volitional Cough: Strong Volitional Swallow: Able to elicit    Oral/Motor/Sensory Function Overall Oral Motor/Sensory Function: Within functional limits(with verbal cueing)   Ice Chips Ice chips: Not tested   Thin Liquid Thin Liquid: Within functional limits Presentation: Cup;Self  Fed;Straw    Nectar Thick Nectar Thick Liquid: Not tested   Honey Thick Honey Thick Liquid: Not tested   Puree Puree: Within functional limits Presentation: Self Fed;Spoon   Solid     Solid: Within functional limits Presentation: [place] MA, CCC-SLP   Halo Laski Meryl 09/06/2017,9:13 AM

## 2017-09-06 NOTE — Plan of Care (Signed)
Continue with plan of care.  

## 2017-09-06 NOTE — Consult Note (Addendum)
Cardiology Consultation:   Patient ID: Joann Gutierrez; [id number]; 01-04-1946   Admit date: 09/04/2017 Date of Consult: 09/06/2017  Primary Care Provider: Seward Carol, MD Primary Cardiologist: Dr. Percival Spanish ( last seen in 2015)   Patient Profile:   Joann Gutierrez is a 72 y.o. female with a hx of CVA, HTN, HLD, DM, GERD, seizures, COPD, and CKD who is being seen today for the evaluation of chest pain at the request of Dr. Almeta Monas.  Importantly echo 2017 >> Hypertrophic cardiomyopathy with 8m/sec jet-- this is either NOTABLE or wrong and related to MR    Joann Gutierrez has had a long history of chest pain and chronic dyspnea. She has had work-up in the past with abnormal stress test however cardiac catheterization 02/2013 showed minimal coronary plaque. Chest pain was felt to be non cardiac. She was last seen by Dr. Percival Spanish in 02/2013.  2D ECHO 05/2015 showed normal EF, G1DD and pulm HTN (PA pressure 59 mmHg).  Joann Gutierrez presented to the [facility] on 09/05/17 with chest pain and abdominal pain. In the ER CT abdomen and pelvis was unremarkable. Troponin returned back at 0.03. Due to minimally elevated troponin she was admitted for overnight observation. On the morning of 09/05/2017 she was having 10/10 chest pain and 3 SL nitroglycerin were given.  She had an acute change in mental status and hypotension.  Nursing suspected left facial droop and a code stroke was called.  Neurology was consulted and noncontrast CT showed a questionable new hypodense lesion.  However, follow-up MRI showed no acute changes, chronic ischemic microangiography, generalized volume loss and single focus of chronic microhemorrhage unchanged from 08/01/17.  Later she was felt to have a vagal episode in the setting of SL NTG use. Bp was initially elevated but now better controlled Review of BPs shows labile BPs. Creat bumped from 1.61--> 2.45. She is now feeling better. No more abdominal pain. She has continued left  axilla and shoulder pain which she describes as her chest pain. It is tender to palpation. She has chronic exertional dyspnea which is unchanged. She denies LE edema but does admit to orthopnea. No dizziness or syncope.   History of Present Illness:     Past Medical History:  Diagnosis Date  . Adenomatous colon polyp   . Allergy   . Anemia   . Asthma       . CAD (coronary artery disease)    Mild very minimal coronary disease with 20% obtuse marginal stenosis  . Carpal tunnel syndrome on left   . CHF (congestive heart failure) ([place])   . Chronic kidney disease (CKD), stage III (moderate) (HCC)   . COPD (chronic obstructive pulmonary disease) ([place])   . CVA (cerebral infarction)   . Depression   . 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 Diverticulosis   . 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 Esophageal dysmotility   . Esophageal stricture   . Gastritis   . Gastroparesis 08/21/2007  . GERD (gastroesophageal reflux disease)   . Hair loss   . Hearing loss of both ears   . Hernia, hiatal   . Hyperlipidemia   . Hypertension   . Morbid obesity ([place])   . Obesity   . Osteoarthritis   . OSTEOARTHRITIS 08/09/2006  . Osteoporosis   . 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 RENAL INSUFFICIENCY 02/16/2009  . 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 Seizures ([place])    pt thinks it has been  several monthes since she had a seisure  . Shingles   . Sickle cell trait ([place])   . Stroke ([place])   . Tubular adenoma of colon     Past Surgical History:  Procedure Laterality Date  . ABDOMINAL HYSTERECTOMY    . ARTERY BIOPSY  01/07/2011   Procedure: MINOR BIOPSY TEMPORAL ARTERY;  Surgeon: Haywood Lasso, MD;  Location: [facility];  Service: General;  Laterality: Left;  left temporal artery biopsy  . bil foot surgery    . BREAST LUMPECTOMY     benign  . BREAST LUMPECTOMY     both breast lumps removed   . CATARACT EXTRACTION Left 06/2016   Dr. Read Drivers  .  COLONOSCOPY    . COLONOSCOPY WITH PROPOFOL N/A 07/05/2015   Procedure: COLONOSCOPY WITH PROPOFOL;  Surgeon: Manus Gunning, MD;  Location: [facility];  Service: Gastroenterology;  Laterality: N/A;  . ERD  08/08/2000  . ESOPHAGEAL MANOMETRY N/A 03/13/2015   Procedure: ESOPHAGEAL MANOMETRY (EM);  Surgeon: Manus Gunning, MD;  Location: [facility];  Service: Gastroenterology;  Laterality: N/A;  . ESOPHAGOGASTRODUODENOSCOPY  06/25/2006  . ESOPHAGOGASTRODUODENOSCOPY (EGD) WITH PROPOFOL N/A 07/05/2015   Procedure: ESOPHAGOGASTRODUODENOSCOPY (EGD) WITH PROPOFOL;  Surgeon: Manus Gunning, MD;  Location: [facility];  Service: Gastroenterology;  Laterality: N/A;  . HERNIA REPAIR    . LEFT AND RIGHT HEART CATHETERIZATION WITH CORONARY ANGIOGRAM N/A 03/03/2013   Procedure: LEFT AND RIGHT HEART CATHETERIZATION WITH CORONARY ANGIOGRAM;  Surgeon: Minus Breeding, MD;  Location: [facility] CATH LAB;  Service: Cardiovascular;  Laterality: N/A;  . REPLACEMENT TOTAL KNEE Left 1998  . UPPER GASTROINTESTINAL ENDOSCOPY       Inpatient Medications: Scheduled Meds: . aspirin  300 mg Rectal Daily   Or  . aspirin  325 mg Oral Daily  . calcium carbonate  500 mg of elemental calcium Oral Q breakfast  . carvedilol  12.5 mg Oral BID WC  . doxazosin  4 mg Oral QHS  . enoxaparin (LOVENOX) injection  40 mg Subcutaneous Q24H  . ezetimibe  10 mg Oral Daily  . fluticasone  2 spray Each Nare Daily  . fluticasone furoate-vilanterol  1 puff Inhalation Daily  . furosemide  80 mg Oral BID  . gabapentin  300 mg Oral BID  . hydrALAZINE  100 mg Oral TID  . insulin aspart  0-9 Units Subcutaneous TID WC  . insulin detemir  15 Units Subcutaneous Daily  . isosorbide mononitrate  120 mg Oral Daily  . losartan  100 mg Oral Daily  . metolazone  2.5 mg Oral Once per day on Mon Wed Fri  . montelukast  10 mg Oral QHS  . pantoprazole  40 mg Oral Daily  . [START ON 09/07/2017] pneumococcal 23 valent vaccine  0.5 mL  Intramuscular Tomorrow-1000  . potassium chloride SA  20 mEq Oral Daily  . sucralfate  1 g Oral BID   Continuous Infusions: . sodium chloride 125 mL/hr at 09/06/17 0809  . sodium chloride     PRN Meds: acetaminophen **OR** acetaminophen, albuterol, guaiFENesin, hydrALAZINE, labetalol, nitroGLYCERIN, ondansetron **OR** ondansetron (ZOFRAN) IV  Allergies:    Allergies  Allergen Reactions  . Morphine And Related Other (See Comments)    Family request not to be given, reports pt does not wake up when given   . Promethazine Hcl Other (See Comments)    REACTION: lethargy    Social History:   Social History   Socioeconomic History  . Marital status: Widowed  Spouse name: Not on file  . Number of children: 2  . Years of education: Not on file  . Highest education level: Not on file  Occupational History  . Occupation: Retired    Fish farm manager: UNEMPLOYED  Social Needs  . Financial resource strain: Not on file  . Food insecurity:    Worry: Not on file    Inability: Not on file  . Transportation needs:    Medical: Not on file    Non-medical: Not on file  Tobacco Use  . Smoking status: Former Smoker    Packs/day: 0.50    Years: 10.00    Pack years: 5.00    Last attempt to quit: 04/18/1980    Years since quitting: 37.4  . Smokeless tobacco: Never Used  Substance and Sexual Activity  . Alcohol use: No    Alcohol/week: 0.0 standard drinks  . Drug use: No  . Sexual activity: Not Currently  Lifestyle  . Physical activity:    Days per week: Not on file    Minutes per session: Not on file  . Stress: Not on file  Relationships  . Social connections:    Talks on phone: Not on file    Gets together: Not on file    Attends religious service: Not on file    Active member of club or organization: Not on file    Attends meetings of clubs or organizations: Not on file    Relationship status: Not on file  . Intimate partner violence:    Fear of current or ex partner: Not on file      Emotionally abused: Not on file    Physically abused: Not on file    Forced sexual activity: Not on file  Other Topics Concern  . Not on file  Social History Narrative   Previously worked as a Electrical engineer.   Daily Caffeine Use-Coffee and Tea   Lives with a friend who is her care giver, has home health nurse come out once a week.  She has family in town- daughter, grand daughter.     Family History:   The patient's family history includes Allergies in her sister; Cancer in her brother; Colon cancer in her brother; Diabetes in her mother, sister, and sister; Heart attack in her father; Heart disease in her mother, sister, and sister; Heart disease (age of onset: 24) in her father; Kidney disease in her mother, sister, and sister; Liver cancer in her mother; Sickle cell anemia in her father. There is no history of Esophageal cancer, Rectal cancer, Stomach cancer, Amblyopia, Blindness, Glaucoma, Macular degeneration, Retinal detachment, Cataracts, Strabismus, or Retinitis pigmentosa.  ROS:  Please see the history of present illness.  ROS  All other ROS reviewed and negative.     Physical Exam/Data:   Vitals:   09/06/17 0300 09/06/17 0400 09/06/17 0739 09/06/17 0755  BP:  (!) 148/74 (!) 156/76   Pulse: 64 (!) 56 (!) 58 65  Resp: 19 16 15 19   Temp:  97.6 F (36.4 C) 98.2 F (36.8 C)   TempSrc:  Oral Oral   SpO2: 100% 99% 97% 98%  Weight:      Height:        Intake/Output Summary (Last 24 hours) at 09/06/2017 0950 Last data filed at 09/06/2017 0600 Gross per 24 hour  Intake 186.47 ml  Output 850 ml  Net -663.53 ml   Filed Weights   09/04/17 1959 09/05/17 0228  Weight: 121.1 kg 119 kg   Body  mass index is 44.34 kg/m. General:  Well nourished, well developed, in no acute distress, overweight  HEENT: normal Lymph: no adenopathy Neck: no JVD Endocrine:  No thryomegaly Vascular: No carotid bruits; FA pulses 2+ bilaterally without bruits  Cardiac:  normal S1, S2; RRR; no  murmur  Lungs:  clear to auscultation bilaterally, no wheezing, rhonchi or rales  Abd: soft, nontender, no hepatomegaly  Ext: no edema Musculoskeletal:  No deformities, BUE and BLE strength normal and equal Skin: warm and dry  Neuro:  CNs 2-12 intact, no focal abnormalities noted Psych:  Normal affect   EKG:  The EKG was personally reviewed and demonstrates: NSR Telemetry:  Telemetry was personally reviewed and demonstrates:  NSR  Relevant CV Studies:    Cardiac Catheterization Procedure Note  03/03/2013 Name: KITIARA HINTZE MRN: [id number] DOB: 1945/10/26  Procedure: Right Heart Cath, Left Heart Cath, Selective Coronary Angiography, LV angiography  Procedural Findings: Hemodynamics:                                     RA 10                                     RV 38/7                                     PA 41/16  28                                     PCWP 13                                     LV 142/5                                     AO 137/76              Oxygen saturations:                                     PA 93                                     AO 67              Cardiac Output (Fick) 9                               Cardiac           Index (Fick) 4   Coronary angiography:  Coronary dominance: right  Left mainstem: Normal  Left anterior descending (LAD): Tortuous.  Mild luminal irregularities.  The vessel is large and wraps the apex.    Left circumflex (LCx): Tortuous and normal.   PL large and normal.  RI moderate sized with ostial 25% stenosis.    Right  coronary artery (RCA): Large and normal.  PDA normal.   Left ventriculography: Left ventricular systolic function is normal, LVEF is estimated at 70%, there is no significant mitral regurgitation   Final Conclusions:  Mildly elevated pulmonary pressures.  NL LV function.  Minimal CAD.  Recommendations: The etiology of her chest pain does not appear to be cardiac.  Dyspnea is likely  multifactorial but there is minimal evidence for diastolic dysfunction as a primary contributor.  (Pulm pressure is upper limits of normal.)  No further work up is planned.     2D ECHO 05/23/15 Study Conclusions - Left ventricle: The cavity size was normal. Wall thickness was   increased in a pattern of moderate LVH. Systolic function was   normal. The estimated ejection fraction was in the range of 60%   to 65%. There was dynamic obstruction at rest, with a peak   velocity of 110 cm/sec and a peak gradient of 5 mm Hg. Wall   motion was normal; there were no regional wall motion   abnormalities. Doppler parameters are consistent with abnormal   left ventricular relaxation (grade 1 diastolic dysfunction).   Doppler parameters are consistent with indeterminate ventricular   filling pressure. - Aortic valve: Transvalvular velocity was within the normal range.   There was no stenosis. There was no regurgitation. - Mitral valve: There was no systolic anterior motion. There was no   regurgitation. - Left atrium: The atrium was mildly dilated. - Right ventricle: The cavity size was normal. Wall thickness was   normal. Systolic function was normal. - Atrial septum: No defect or patent foramen ovale was identified   by color flow Doppler. There was redundancy of the septum, with   borderline criteria for aneurysm. - Tricuspid valve: There was moderate regurgitation. - Pulmonary arteries: Systolic pressure was moderately to severely   increased. PA peak pressure: 59 mm Hg (S). - Inferior vena cava: The vessel was dilated. The respirophasic   diameter changes were blunted (< 50%), consistent with elevated   central venous pressure. - Pericardium, extracardiac: A trivial pericardial effusion was   identified.   Laboratory Data:  Chemistry Recent Labs  Lab 09/04/17 2139 09/05/17 0339 09/06/17 0308  NA 140 143 143  K 3.6 4.0 4.0  CL 103 106 104  CO2 27 26 27   GLUCOSE 133* 138* 84    BUN 38* 32* 38*  CREATININE 1.84* 1.61* 2.45*  CALCIUM 8.9 8.8* 8.7*  GFRNONAA 26* 31* 19*  GFRAA 30* 36* 22*  ANIONGAP 10 11 12     Recent Labs  Lab 09/04/17 2139 09/05/17 0339  PROT 7.2 7.0  ALBUMIN 3.3* 3.2*  AST 17 14*  ALT 9 8  ALKPHOS 82 85  BILITOT 0.6 0.9   Hematology Recent Labs  Lab 09/04/17 2139 09/05/17 0339  WBC 7.5 8.2  RBC 4.60 4.86  HGB 10.4* 10.7*  HCT 34.8* 36.4  MCV 75.7* 74.9*  MCH 22.6* 22.0*  MCHC 29.9* 29.4*  RDW 21.2* 21.6*  PLT 200 210   Cardiac Enzymes Recent Labs  Lab 09/04/17 2139 09/05/17 0339 09/05/17 0931 09/05/17 1538 09/05/17 2103 09/06/17 0308  TROPONINI 0.03* <0.03 <0.03 <0.03 <0.03 <0.03   No results for input(s): TROPIPOC in the last 168 hours.  BNP Recent Labs  Lab 09/04/17 2139  BNP 58.5    DDimer No results for input(s): DDIMER in the last 168 hours.  Radiology/Studies:  Ct Abdomen Pelvis Wo Contrast  Result Date: 09/05/2017 CLINICAL DATA:  72 y/o  F; abdominal distention. EXAM: CT ABDOMEN AND PELVIS WITHOUT CONTRAST TECHNIQUE: Multidetector CT imaging of the abdomen and pelvis was performed following the standard protocol without IV contrast. COMPARISON:  09/04/2017 CT abdomen and pelvis. FINDINGS: Lower chest: Mild cardiomegaly. Small focus of consolidation within the basilar right lower lobe may represent atelectasis or pneumonia. Hepatobiliary: No focal liver abnormality is seen. No gallstones, gallbladder wall thickening, or biliary dilatation. Excretion of contrast into the gallbladder. Pancreas: Unremarkable. No pancreatic ductal dilatation or surrounding inflammatory changes. Spleen: Normal in size without focal abnormality. Adrenals/Urinary Tract: Adrenal glands are unremarkable. Multiple renal cysts measuring up to 3.9 cm in the left kidney lower pole. Otherwise kidneys are normal, without renal calculi, focal lesion, or hydronephrosis. Bladder is unremarkable. Contrast excretion into the renal collecting  systems. Stomach/Bowel: Stomach is within normal limits. Appendix not identified, no pericecal inflammation. No evidence of bowel wall thickening, distention, or inflammatory changes. Diverticulosis without findings of acute diverticulitis. Vascular/Lymphatic: Aortic atherosclerosis. No enlarged abdominal or pelvic lymph nodes. Reproductive: Status post hysterectomy. No adnexal masses. Other: No abdominal wall hernia or abnormality. No abdominopelvic ascites. Musculoskeletal: No fracture is seen. IMPRESSION: 1. Small focus of consolidation within the basilar right lower lobe may represent atelectasis or pneumonia. 2. No acute process of the abdomen and pelvis identified. 3. Mild cardiomegaly. 4. Diverticulosis without findings of acute diverticulitis. 5. Aortic atherosclerosis. Electronically Signed   By: Kristine Garbe M.D.   On: 09/05/2017 20:51   Dg Chest 2 View  Result Date: 09/04/2017 CLINICAL DATA:  Dyspnea EXAM: CHEST - 2 VIEW COMPARISON:  08/12/2017 FINDINGS: Cardiomegaly with vascular congestion. Chronic elevation of right diaphragm. No pleural effusion. No focal airspace disease. IMPRESSION: Cardiomegaly with vascular congestion. Electronically Signed   By: Donavan Foil M.D.   On: 09/04/2017 21:58   Mr Brain Wo Contrast  Result Date: 09/05/2017 CLINICAL DATA:  Stroke follow-up. Altered mental status. Hemiparesis and facial droop. EXAM: MRI HEAD WITHOUT CONTRAST TECHNIQUE: Multiplanar, multiecho pulse sequences of the brain and surrounding structures were obtained without intravenous contrast. COMPARISON:  Head CT 09/05/2017.  Brain MRI 08/12/2017. FINDINGS: BRAIN: There is no acute infarct, acute hemorrhage or mass effect. The midline structures are normal. There is an old left cerebellar infarct. Diffuse confluent hyperintense T2-weighted signal within the periventricular, deep and juxtacortical white matter, most commonly due to chronic ischemic microangiopathy. Generalized atrophy  without lobar predilection. Single focus of chronic microhemorrhage in the right brainstem unchanged from prior MRI. VASCULAR: Major intracranial arterial and venous sinus flow voids are preserved. SKULL AND UPPER CERVICAL SPINE: The visualized skull base, calvarium, upper cervical spine and extracranial soft tissues are normal. SINUSES/ORBITS: No fluid levels or advanced mucosal thickening. No mastoid or middle ear effusion. The orbits are normal. IMPRESSION: 1. No acute intracranial abnormality. 2. 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 3. Single focus of chronic microhemorrhage in the right brainstem may correspond to the area gliosis seen on the head CT. This is unchanged from 08/12/2017. Electronically Signed   By: Ulyses Jarred M.D.   On: 09/05/2017 13:55   Ct Abdomen Pelvis W Contrast  Result Date: 09/04/2017 CLINICAL DATA:  Abdominal pain palpable subcutaneous nodules EXAM: CT ABDOMEN AND PELVIS WITH CONTRAST TECHNIQUE: Multidetector CT imaging of the abdomen and pelvis was performed using the standard protocol following bolus administration of intravenous contrast. CONTRAST:  120mL OMNIPAQUE IOHEXOL 300 MG/ML  SOLN COMPARISON:  12/14/2015 FINDINGS: Lower chest: Lung bases are well aerated with the exception of some stable right lower lobe  scarring. Mild cardiomegaly is noted. Hepatobiliary: No focal liver abnormality is seen. No gallstones, gallbladder wall thickening, or biliary dilatation. Pancreas: Unremarkable. No pancreatic ductal dilatation or surrounding inflammatory changes. Spleen: Normal in size without focal abnormality. Adrenals/Urinary Tract: Adrenal glands are within normal limits. The kidneys are well visualized bilaterally. No obstructive changes or renal calculi are noted. The bladder is partially distended. Bilateral stable renal cysts are noted. Stomach/Bowel: Diverticular change of the colon is noted without evidence of diverticulitis. The appendix is not well  visualized. No inflammatory changes to suggest appendicitis are noted. No obstructive changes are seen. Vascular/Lymphatic: Aortic atherosclerosis. No enlarged abdominal or pelvic lymph nodes. Reproductive: Status post hysterectomy. No adnexal masses. Other: No abdominal wall hernia or abnormality. No abdominopelvic ascites. Musculoskeletal: Degenerative changes of lumbar spine are noted. IMPRESSION: Diverticulosis without diverticulitis. Stable renal cysts. No acute abnormality noted. Electronically Signed   By: Inez Catalina M.D.   On: 09/04/2017 23:50   Ct Head Code Stroke Wo Contrast  Result Date: 09/05/2017 CLINICAL DATA:  Code stroke. 72 year old female with altered mental status. EXAM: CT HEAD WITHOUT CONTRAST TECHNIQUE: Contiguous axial images were obtained from the base of the skull through the vertex without intravenous contrast. COMPARISON:  Brain MRI 08/12/2017. Head CT without contrast 10/24/2016, and earlier. FINDINGS: Brain: Stable cerebral volume since 2018. Patchy bilateral cerebral white matter hypodensity is stable. 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 Small chronic left thalamic lacune is evident. Questionable new small area of hypodensity in the right pons (series 3, image 12), but could be artifact. No midline shift, ventriculomegaly, mass effect, evidence of mass lesion, intracranial hemorrhage or evidence of cortically based acute infarction. Vascular: Calcified atherosclerosis at the skull base. No suspicious intracranial vascular hyperdensity. Skull: Stable, negative. Sinuses/Orbits: Visualized paranasal sinuses and mastoids are stable and well pneumatized. Other: Chronic postoperative changes to the left globe. No acute orbit or scalp soft tissue findings. Negative visible noncontrast deep soft tissue spaces of the face. ASPECTS [facility] Stroke Program Early CT Score) - Ganglionic level infarction (caudate, lentiform nuclei, internal capsule, insula, M1-M3  cortex): 7 - Supraganglionic infarction (M4-M6 cortex): 3 Total score (0-10 with 10 being normal): 10 IMPRESSION: 1. Questionable new hypodense lacune in the right pons, but may be artifact. 2. Otherwise stable chronic small vessel ischemia. No acute intracranial hemorrhage or cortically based infarct. 3. ASPECTS is 10. 4. These results were communicated to Dr. Rory Percy at 12:47 pmon 8/16/2019by text page via the [facility] messaging system. Electronically Signed   By: Genevie [name] M.D.   On: 09/05/2017 12:47     Assessment and Plan:   Chest pain: first troponin was minimally elevated at 0.03. Subsequent labs have shown normal levels <0.03. ECG with NSR and no acute ST/TW changes. She continues to complain of left shoulder/axilla pain that is tender to palpation and with movement. Pain is not related to exertion. Pain very atypical for cardiac etiology. She has a history of an abnormal nuc in the past with a normal cardiac cath in 2015. If she continues to have issues we can plan for outpatient ischemic work up. We could consider a cardiac CT given false positive nuc in the past, however, kidney function would need to stabilize. She can eat today. I will have the office contact her to arrange a follow up in our office over the next couple weeks.   AKI: creat bumped from 1.61--> 2.45. I have stopped her ARB and lasix/metolazone. Would ensure creat stabilizes before resuming. Baseline  appears to 1.35-1.8.   HTN: BPs have been labile. Mildly elevated currently.   Signed, Angelena Form, PA-C  09/06/2017 9:50 AM  Chest pain with chest wall tenderness reproducing symptoms  Echo 2017 read as Hypertrophic cardiomyopathy with significant outflow obstruction  Obesity  AKI  No known CAD by cath 2015    Seen and agree with KT note above  Meds held 2/2 renal insufficiency  Repeat echo pending -- need to clarify whether HOCM or LVH w MR  With kidney injury would hold off on NSAID otherwise recommended for  ches wall pain  Will follow behind re echo but otherwise nothing left for cardiology to offer   Also took liberty to reduce ASA >>81 mg  Will follow

## 2017-09-06 NOTE — Progress Notes (Signed)
RT NOTE:  Pt was sleeping when RT finally made it to room to put on CPAP. Pt was sleeping and husband ask RT not to wake her for CPAP. CPAP is in room, RN is aware.

## 2017-09-07 ENCOUNTER — Inpatient Hospital Stay (HOSPITAL_COMMUNITY): Payer: Medicare Other

## 2017-09-07 ENCOUNTER — Observation Stay (HOSPITAL_COMMUNITY): Payer: Medicare Other

## 2017-09-07 DIAGNOSIS — I361 Nonrheumatic tricuspid (valve) insufficiency: Secondary | ICD-10-CM | POA: Diagnosis not present

## 2017-09-07 DIAGNOSIS — Z6841 Body Mass Index (BMI) 40.0 and over, adult: Secondary | ICD-10-CM | POA: Diagnosis not present

## 2017-09-07 DIAGNOSIS — R404 Transient alteration of awareness: Secondary | ICD-10-CM | POA: Diagnosis not present

## 2017-09-07 DIAGNOSIS — I10 Essential (primary) hypertension: Secondary | ICD-10-CM | POA: Diagnosis not present

## 2017-09-07 DIAGNOSIS — K219 Gastro-esophageal reflux disease without esophagitis: Secondary | ICD-10-CM | POA: Diagnosis present

## 2017-09-07 DIAGNOSIS — G4733 Obstructive sleep apnea (adult) (pediatric): Secondary | ICD-10-CM | POA: Diagnosis present

## 2017-09-07 DIAGNOSIS — J309 Allergic rhinitis, unspecified: Secondary | ICD-10-CM | POA: Diagnosis not present

## 2017-09-07 DIAGNOSIS — N189 Chronic kidney disease, unspecified: Secondary | ICD-10-CM | POA: Diagnosis not present

## 2017-09-07 DIAGNOSIS — N179 Acute kidney failure, unspecified: Secondary | ICD-10-CM | POA: Diagnosis not present

## 2017-09-07 DIAGNOSIS — R0602 Shortness of breath: Secondary | ICD-10-CM | POA: Diagnosis not present

## 2017-09-07 DIAGNOSIS — R079 Chest pain, unspecified: Secondary | ICD-10-CM | POA: Diagnosis not present

## 2017-09-07 DIAGNOSIS — E1142 Type 2 diabetes mellitus with diabetic polyneuropathy: Secondary | ICD-10-CM | POA: Diagnosis not present

## 2017-09-07 DIAGNOSIS — Z794 Long term (current) use of insulin: Secondary | ICD-10-CM | POA: Diagnosis not present

## 2017-09-07 DIAGNOSIS — I2510 Atherosclerotic heart disease of native coronary artery without angina pectoris: Secondary | ICD-10-CM | POA: Diagnosis present

## 2017-09-07 DIAGNOSIS — D638 Anemia in other chronic diseases classified elsewhere: Secondary | ICD-10-CM | POA: Diagnosis not present

## 2017-09-07 DIAGNOSIS — I959 Hypotension, unspecified: Secondary | ICD-10-CM | POA: Diagnosis not present

## 2017-09-07 DIAGNOSIS — I422 Other hypertrophic cardiomyopathy: Secondary | ICD-10-CM | POA: Diagnosis present

## 2017-09-07 DIAGNOSIS — R609 Edema, unspecified: Secondary | ICD-10-CM | POA: Diagnosis not present

## 2017-09-07 DIAGNOSIS — G8194 Hemiplegia, unspecified affecting left nondominant side: Secondary | ICD-10-CM | POA: Diagnosis not present

## 2017-09-07 DIAGNOSIS — R1084 Generalized abdominal pain: Secondary | ICD-10-CM | POA: Diagnosis not present

## 2017-09-07 DIAGNOSIS — R4182 Altered mental status, unspecified: Secondary | ICD-10-CM | POA: Diagnosis not present

## 2017-09-07 DIAGNOSIS — I5032 Chronic diastolic (congestive) heart failure: Secondary | ICD-10-CM | POA: Diagnosis present

## 2017-09-07 DIAGNOSIS — K5790 Diverticulosis of intestine, part unspecified, without perforation or abscess without bleeding: Secondary | ICD-10-CM | POA: Diagnosis present

## 2017-09-07 DIAGNOSIS — I5022 Chronic systolic (congestive) heart failure: Secondary | ICD-10-CM | POA: Diagnosis not present

## 2017-09-07 DIAGNOSIS — I272 Pulmonary hypertension, unspecified: Secondary | ICD-10-CM | POA: Diagnosis present

## 2017-09-07 DIAGNOSIS — I200 Unstable angina: Secondary | ICD-10-CM | POA: Diagnosis not present

## 2017-09-07 DIAGNOSIS — N183 Chronic kidney disease, stage 3 (moderate): Secondary | ICD-10-CM | POA: Diagnosis not present

## 2017-09-07 DIAGNOSIS — Z7401 Bed confinement status: Secondary | ICD-10-CM | POA: Diagnosis not present

## 2017-09-07 DIAGNOSIS — I129 Hypertensive chronic kidney disease with stage 1 through stage 4 chronic kidney disease, or unspecified chronic kidney disease: Secondary | ICD-10-CM | POA: Diagnosis not present

## 2017-09-07 DIAGNOSIS — I639 Cerebral infarction, unspecified: Secondary | ICD-10-CM | POA: Diagnosis not present

## 2017-09-07 DIAGNOSIS — E119 Type 2 diabetes mellitus without complications: Secondary | ICD-10-CM | POA: Diagnosis not present

## 2017-09-07 DIAGNOSIS — M2550 Pain in unspecified joint: Secondary | ICD-10-CM | POA: Diagnosis not present

## 2017-09-07 DIAGNOSIS — D573 Sickle-cell trait: Secondary | ICD-10-CM | POA: Diagnosis present

## 2017-09-07 DIAGNOSIS — G459 Transient cerebral ischemic attack, unspecified: Secondary | ICD-10-CM | POA: Diagnosis not present

## 2017-09-07 DIAGNOSIS — G92 Toxic encephalopathy: Secondary | ICD-10-CM | POA: Diagnosis present

## 2017-09-07 DIAGNOSIS — I130 Hypertensive heart and chronic kidney disease with heart failure and stage 1 through stage 4 chronic kidney disease, or unspecified chronic kidney disease: Secondary | ICD-10-CM | POA: Diagnosis present

## 2017-09-07 DIAGNOSIS — Z8673 Personal history of transient ischemic attack (TIA), and cerebral infarction without residual deficits: Secondary | ICD-10-CM | POA: Diagnosis not present

## 2017-09-07 DIAGNOSIS — D631 Anemia in chronic kidney disease: Secondary | ICD-10-CM | POA: Diagnosis present

## 2017-09-07 DIAGNOSIS — E1122 Type 2 diabetes mellitus with diabetic chronic kidney disease: Secondary | ICD-10-CM | POA: Diagnosis present

## 2017-09-07 DIAGNOSIS — M6281 Muscle weakness (generalized): Secondary | ICD-10-CM | POA: Diagnosis not present

## 2017-09-07 DIAGNOSIS — Z23 Encounter for immunization: Secondary | ICD-10-CM | POA: Diagnosis not present

## 2017-09-07 DIAGNOSIS — E785 Hyperlipidemia, unspecified: Secondary | ICD-10-CM | POA: Diagnosis present

## 2017-09-07 DIAGNOSIS — E7800 Pure hypercholesterolemia, unspecified: Secondary | ICD-10-CM | POA: Diagnosis present

## 2017-09-07 DIAGNOSIS — R072 Precordial pain: Secondary | ICD-10-CM | POA: Diagnosis not present

## 2017-09-07 DIAGNOSIS — I161 Hypertensive emergency: Secondary | ICD-10-CM | POA: Diagnosis present

## 2017-09-07 DIAGNOSIS — N170 Acute kidney failure with tubular necrosis: Secondary | ICD-10-CM | POA: Diagnosis present

## 2017-09-07 DIAGNOSIS — J449 Chronic obstructive pulmonary disease, unspecified: Secondary | ICD-10-CM | POA: Diagnosis not present

## 2017-09-07 DIAGNOSIS — K3184 Gastroparesis: Secondary | ICD-10-CM | POA: Diagnosis not present

## 2017-09-07 DIAGNOSIS — E1143 Type 2 diabetes mellitus with diabetic autonomic (poly)neuropathy: Secondary | ICD-10-CM | POA: Diagnosis present

## 2017-09-07 LAB — CBC
HCT: 32 % — ABNORMAL LOW (ref 36.0–46.0)
Hemoglobin: 9.6 g/dL — ABNORMAL LOW (ref 12.0–15.0)
MCH: 22.4 pg — AB (ref 26.0–34.0)
MCHC: 30 g/dL (ref 30.0–36.0)
MCV: 74.8 fL — ABNORMAL LOW (ref 78.0–100.0)
PLATELETS: 179 10*3/uL (ref 150–400)
RBC: 4.28 MIL/uL (ref 3.87–5.11)
RDW: 20.8 % — ABNORMAL HIGH (ref 11.5–15.5)
WBC: 5.7 10*3/uL (ref 4.0–10.5)

## 2017-09-07 LAB — BASIC METABOLIC PANEL
ANION GAP: 8 (ref 5–15)
BUN: 48 mg/dL — ABNORMAL HIGH (ref 8–23)
CALCIUM: 7.7 mg/dL — AB (ref 8.9–10.3)
CO2: 26 mmol/L (ref 22–32)
CREATININE: 3.45 mg/dL — AB (ref 0.44–1.00)
Chloride: 104 mmol/L (ref 98–111)
GFR calc Af Amer: 14 mL/min — ABNORMAL LOW (ref >60)
GFR, EST NON AFRICAN AMERICAN: 12 mL/min — AB (ref >60)
Glucose, Bld: 238 mg/dL — ABNORMAL HIGH (ref 70–99)
Potassium: 4.3 mmol/L (ref 3.5–5.1)
SODIUM: 138 mmol/L (ref 135–145)

## 2017-09-07 LAB — GLUCOSE, CAPILLARY
GLUCOSE-CAPILLARY: 218 mg/dL — AB (ref 70–99)
GLUCOSE-CAPILLARY: 179 mg/dL — AB (ref 70–99)
Glucose-Capillary: 158 mg/dL — ABNORMAL HIGH (ref 70–99)
Glucose-Capillary: 201 mg/dL — ABNORMAL HIGH (ref 70–99)

## 2017-09-07 LAB — URINALYSIS, ROUTINE W REFLEX MICROSCOPIC
Bilirubin Urine: NEGATIVE
Glucose, UA: NEGATIVE mg/dL
Hgb urine dipstick: NEGATIVE
Ketones, ur: NEGATIVE mg/dL
Leukocytes, UA: NEGATIVE
Nitrite: NEGATIVE
PH: 5 (ref 5.0–8.0)
Protein, ur: 100 mg/dL — AB
SPECIFIC GRAVITY, URINE: 1.019 (ref 1.005–1.030)

## 2017-09-07 LAB — PROTEIN / CREATININE RATIO, URINE
CREATININE, URINE: 207.84 mg/dL
Protein Creatinine Ratio: 0.36 mg/mg{Cre} — ABNORMAL HIGH (ref 0.00–0.15)
TOTAL PROTEIN, URINE: 74 mg/dL

## 2017-09-07 LAB — SEDIMENTATION RATE: SED RATE: 33 mm/h — AB (ref 0–22)

## 2017-09-07 MED ORDER — LABETALOL HCL 5 MG/ML IV SOLN: 10.0000 mg | Freq: Four times a day (QID) | INTRAVENOUS | Status: DC | PRN

## 2017-09-07 MED ORDER — INSULIN DETEMIR 100 UNIT/ML ~~LOC~~ SOLN
8.0000 [IU] | Freq: Every day | SUBCUTANEOUS | Status: DC
  Administered 2017-09-07: 8 [IU] via SUBCUTANEOUS
  Filled 2017-09-07 (×2): qty 0.08

## 2017-09-07 NOTE — Progress Notes (Signed)
Short Note:  TIA workup not complete  -Awaiting official report for carotid US still pending  -Echo pending  -MRI negative for stroke  - Continue current recommended TIA management   - Will come back to see once full TIA workup is complete or for any acute neurological changes

## 2017-09-07 NOTE — Procedures (Signed)
Patient in ultrasound at 4:20pm, cannot do echo at this time.

## 2017-09-07 NOTE — Progress Notes (Signed)
PROGRESS NOTE    MANHA AMATO  [name]:[id number] DOB: October 24, 1945 DOA: 09/04/2017 PCP: Seward Carol, MD      Brief Narrative:  Joann Gutierrez is a 72 y.o. F with complex PMHx, COPD not on home O2, HTN, CKD III baseline Cr 1.8, dCHF, OSA, DM, chronic anemia, gastroparesis, and chronic widespread pain complaints who presented initially with complaints of "'knots' on her left neck, under left breast, and on left side of abdomen" for several weeks, then family added reports of "more confused than usually" and "mild odor" to urine, then to second ED provider reported abdominal pain "like there is a mass in her left upper quadrant and right lower quadrant.  Ongoing for 2 weeks".  As a result of her abdominal pain, a troponin was obtained that was 0.03 ng/mL, after which the patient was asked a third time if she had chest pain and this time said yes (had been previously documented as having no chest pain), and so TRH were asked to observe for cardiac rule out.  Shortly after admission to the hospital, the patient had an episode of hypotension and altered mental status.  CODE STROKE was called by nursing, and Neurology were consulted.        Assessment & Plan:  Acute renal failure with oliguria Not the presenting complaint, has developed since admission.    Baseline Cr 1.6, today more than doubled, no urine output overnight, bladder scan negative.  Got contrast on day of admission.  Was hypertensive, taking ARB, Lasix, metolazone, then had transient 90 minute hypotensive episode two days ago (believed to be vagal episode).  Now with poor UOP and progressively worse creatinine.  BP soft yesterday (100s to 120s). -Losartan, Lasix, metolazone held. -Hold losartan, diuretics -Discontinue BP meds -Continue IV fluids -Check UA, microscopy, urine protein -Obtain Renal US -Trend BMP   Abdominal pain See below, the patient has widespread soft tissue pain.  Her LFTs are normal and CT abdomen  on admission and then again yesterday in context of her hypotension were completely normal. The etiology of this is unclear, but no indications to suggest infection, intra-abdominal perforation, peritonitis or hemorrhage.  Hypotension, transient This occurred 8/16 in the following context: patient complained to nursing of chest pain, was given nitro x2 which did not relieve her pain, followed by which she had hypotension, became less responsive globally, and CODE STROKE was called.  Her hypotension resolved spontaneously after about 90 minutes without fluids or other intervention and in fact she became hypertensive again that night when her oral medications were held.  Elevated troponin ACS is not present.  Cardiology were consulted, they recommend echocardiogram. -Follow echocardiogram  Possible TIA Her episode of decreased responsiveness yesterday occurred in the context of hypotension, and appeared to me to be simple presyncope.  However, I agree that TIA is not possible to rule out, given nursing initial observation of left sided weakness. -MRI negative for stroke -Echocardiogram pending -Carotid imaging unremarkable -Lipids ordered, LDL elevated, but statin held with muscle pain -Aspirin ordered   -Atrial fibrillation: not present -tPA not given because symptoms resolved -Dysphagia screen ordered  -PT eval ordered  COPD  No active disease -Continue home ICS/laba, Flonase -Bronchodilators if needed -Continue Singulair, PPI  Hypertension -Hold losartan, Lasix, metolazone - Hold doxazosin, carvedilol, hydralazine, Imdur -Labetalol as needed for blood pressures greater than 170 -Continue Zetia  -Trial off statin (as above)  Chronic abdominal pain -Continue sucralfate  Diabetes Glucoses elevated -Continue Levemir -Continue SSI  Anemia of chronic kidney disease Stable Hgb  Chest pain Widespread soft tissue pain This was the reason for admission, although not the chief  presenting complaint (she had an incidentally noted detectable troponin, was subsequently asked if she has chest pain, which she does, chronically, as well as widespread soft tissue pain, I believe (she and husband are vague historians, but this is supported by previous admission histories/exams).   The differential for this would probably be fibromyalgia, statin-induced myopathy, and inflammatory myopathies.  Former seems unlikely to develop this stage in life I would think, and latter seem unlikely with normal CK.  TSH normal.  ANA and ESR pending.  Her chest pain is clearly reproducible on exam.  Cardiology believe this is chest wall pain from arthritis, I agree.    -Trial off statin, follow up with PCP      DVT prophylaxis: Enoxaparin Code Status: FULL Family Communication: Husband at bedside MDM and disposition Plan: The below labs and imaging reports reviewed and summarized above.  Medication management as above.  The patient initially presented for "skin knots", but then was found to have an unexplained troponin elevation, and chronic chest pain.  While in observation for chest pain and elevated troponin, she developed hypotensive episode followed by acute renal failure.  She is now oliguric, requiring IV fluids, close monitoring of renal function, additional work-up as outlined above.   Consultants:   Cardiology  Neurology  Procedures:   CT head  MRI head  Carotid US  Echocardiogram  Renal ultrasound    Antimicrobials:   None    Subjective: Feels back to baseline.  However, poor urine output overnight.  No swelling.  No new pains, her widespread tenderness in legs abdomen chest are less prominent to her today.  She is globally weak.  No focal weakness or slurred speech.  No confusion.   Objective: Vitals:   09/06/17 2248 09/07/17 0423 09/07/17 0800 09/07/17 0803  BP: 124/72 (!) 102/59 (!) 127/59   Pulse:  (!) 57 64 75  Resp:  _0 Temp:  98.2 F (36.8  C) 98.1 F (36.7 C)   TempSrc:  Axillary Axillary   SpO2:  99% 98% 98%  Weight:      Height:        Intake/Output Summary (Last 24 hours) at 09/07/2017 0851 Last data filed at 09/07/2017 0400 Gross per 24 hour  Intake 2963.49 ml  Output 250 ml  Net 2713.49 ml   Filed Weights   09/04/17 1959 09/05/17 0228  Weight: 121.1 kg 119 kg    Examination: General appearance: B's adult female, lying in bed, no acute distress, interactive. HEENT: Anicteric, conjunctival pink, lids and lashes normal.  No nasal deformity, discharge, or epistaxis.  Lips moist, dentures in place.  Oropharynx moist, no tongue biting, no oral lesions.  Hearing diminished.   Skin: Skin warm and dry.  I do not appreciate skin knots. Cardiac: RRR, no murmurs, JVP not visible, no lower extremity edema. Respiratory: Normal respiratory rate and rhythm.  Lungs clear without rales or wheezes. Abdomen: Abdomen exquisitely tender to palpation, voluntary guarding.  No ascites, distention, hepatosplenomegaly.   MSK: No deformities or effusions. Neuro: Awake and alert, cranial nerves III through XII intact, extraocular movements intact, moves all extremities with equal strength, normal coordination, speech fluent.    Psych: Sensorium intact and responding to questions, attention normal, affect blunted, judgment and insight appear impaired.    Data Reviewed: I have personally reviewed following labs and  imaging studies:  CBC: Recent Labs  Lab 09/04/17 2139 09/05/17 0339 09/07/17 0256  WBC 7.5 8.2 5.7  NEUTROABS 4.3 4.6  --   HGB 10.4* 10.7* 9.6*  HCT 34.8* 36.4 32.0*  MCV 75.7* 74.9* 74.8*  PLT 200 210 793   Basic Metabolic Panel: Recent Labs  Lab 09/04/17 2139 09/05/17 0339 09/06/17 0308 09/07/17 0256  NA 140 143 143 138  K 3.6 4.0 4.0 4.3  CL 103 106 104 104  CO2 _0 GLUCOSE 133* 138* 84 238*  BUN 38* 32* 38* 48*  CREATININE 1.84* 1.61* 2.45* 3.45*  CALCIUM 8.9 8.8* 8.7* 7.7*    GFR: Estimated Creatinine Clearance: 18.9 mL/min (A) (by C-G formula based on SCr of 3.45 mg/dL (H)). Liver Function Tests: Recent Labs  Lab 09/04/17 2139 09/05/17 0339  AST 17 14*  ALT 9 8  ALKPHOS 82 85  BILITOT 0.6 0.9  PROT 7.2 7.0  ALBUMIN 3.3* 3.2*   Recent Labs  Lab 09/04/17 2139  LIPASE 28   Recent Labs  Lab 09/05/17 1538  AMMONIA 36*   Coagulation Profile: No results for input(s): INR, PROTIME in the last 168 hours. Cardiac Enzymes: Recent Labs  Lab 09/05/17 0339 09/05/17 0931 09/05/17 1538 09/05/17 2103 09/06/17 0308  CKTOTAL  --   --  68  --   --   TROPONINI <0.03 <0.03 <0.03 <0.03 <0.03   BNP (last 3 results) No results for input(s): PROBNP in the last 8760 hours. HbA1C: Recent Labs    09/06/17 0308  HGBA1C 11.5*   CBG: Recent Labs  Lab 09/06/17 0742 09/06/17 1155 09/06/17 1624 09/06/17 2145 09/07/17 0840  GLUCAP 87 135* 140* 263* 218*   Lipid Profile: Recent Labs    09/06/17 0308  CHOL 238*  HDL 37*  LDLCALC 166*  TRIG 174*  CHOLHDL 6.4   Thyroid Function Tests: Recent Labs    09/05/17 0339  TSH 2.748   Anemia Panel: No results for input(s): VITAMINB12, FOLATE, FERRITIN, TIBC, IRON, RETICCTPCT in the last 72 hours. Urine analysis:    Component Value Date/Time   COLORURINE YELLOW 09/04/2017 1722   APPEARANCEUR CLEAR 09/04/2017 1722   LABSPEC 1.012 09/04/2017 1722   PHURINE 5.0 09/04/2017 1722   GLUCOSEU NEGATIVE 09/04/2017 1722   GLUCOSEU >=1000 01/02/2009 1101   HGBUR NEGATIVE 09/04/2017 1722   HGBUR negative 03/25/2008 1022   BILIRUBINUR NEGATIVE 09/04/2017 1722   KETONESUR NEGATIVE 09/04/2017 1722   PROTEINUR 100 (A) 09/04/2017 1722   UROBILINOGEN 0.2 09/14/2013 1632   NITRITE NEGATIVE 09/04/2017 1722   LEUKOCYTESUR NEGATIVE 09/04/2017 1722   Sepsis Labs: _1 (procalcitonin:4,lacticacidven:4)  ) Recent Results (from the past 240 hour(s))  MRSA PCR Screening     Status: None   Collection Time:  09/05/17  2:42 AM  Result Value Ref Range Status   MRSA by PCR NEGATIVE NEGATIVE Final    Comment:        The GeneXpert MRSA Assay (FDA approved for NASAL specimens only), is one component of a comprehensive MRSA colonization surveillance program. It is not intended to diagnose MRSA infection nor to guide or monitor treatment for MRSA infections. Performed at [facility], [place] [address] [place], [place] [address]          Radiology Studies: Ct Abdomen Pelvis Wo Contrast  Result Date: 09/05/2017 CLINICAL DATA:  72 y/o  F; abdominal distention. EXAM: CT ABDOMEN AND PELVIS WITHOUT CONTRAST TECHNIQUE: Multidetector CT imaging of the abdomen and pelvis was performed  following the standard protocol without IV contrast. COMPARISON:  09/04/2017 CT abdomen and pelvis. FINDINGS: Lower chest: Mild cardiomegaly. Small focus of consolidation within the basilar right lower lobe may represent atelectasis or pneumonia. Hepatobiliary: No focal liver abnormality is seen. No gallstones, gallbladder wall thickening, or biliary dilatation. Excretion of contrast into the gallbladder. Pancreas: Unremarkable. No pancreatic ductal dilatation or surrounding inflammatory changes. Spleen: Normal in size without focal abnormality. Adrenals/Urinary Tract: Adrenal glands are unremarkable. Multiple renal cysts measuring up to 3.9 cm in the left kidney lower pole. Otherwise kidneys are normal, without renal calculi, focal lesion, or hydronephrosis. Bladder is unremarkable. Contrast excretion into the renal collecting systems. Stomach/Bowel: Stomach is within normal limits. Appendix not identified, no pericecal inflammation. No evidence of bowel wall thickening, distention, or inflammatory changes. Diverticulosis without findings of acute diverticulitis. Vascular/Lymphatic: Aortic atherosclerosis. No enlarged abdominal or pelvic lymph nodes. Reproductive: Status post hysterectomy. No adnexal masses. Other: No  abdominal wall hernia or abnormality. No abdominopelvic ascites. Musculoskeletal: No fracture is seen. IMPRESSION: 1. Small focus of consolidation within the basilar right lower lobe may represent atelectasis or pneumonia. 2. No acute process of the abdomen and pelvis identified. 3. Mild cardiomegaly. 4. Diverticulosis without findings of acute diverticulitis. 5. Aortic atherosclerosis. Electronically Signed   By: Kristine Garbe M.D.   On: 09/05/2017 20:51   Mr Brain Wo Contrast  Result Date: 09/05/2017 CLINICAL DATA:  Stroke follow-up. Altered mental status. Hemiparesis and facial droop. EXAM: MRI HEAD WITHOUT CONTRAST TECHNIQUE: Multiplanar, multiecho pulse sequences of the brain and surrounding structures were obtained without intravenous contrast. COMPARISON:  Head CT 09/05/2017.  Brain MRI 08/12/2017. FINDINGS: BRAIN: There is no acute infarct, acute hemorrhage or mass effect. The midline structures are normal. There is an old left cerebellar infarct. Diffuse confluent hyperintense T2-weighted signal within the periventricular, deep and juxtacortical white matter, most commonly due to chronic ischemic microangiopathy. Generalized atrophy without lobar predilection. Single focus of chronic microhemorrhage in the right brainstem unchanged from prior MRI. VASCULAR: Major intracranial arterial and venous sinus flow voids are preserved. SKULL AND UPPER CERVICAL SPINE: The visualized skull base, calvarium, upper cervical spine and extracranial soft tissues are normal. SINUSES/ORBITS: No fluid levels or advanced mucosal thickening. No mastoid or middle ear effusion. The orbits are normal. IMPRESSION: 1. No acute intracranial abnormality. 2. 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 3. Single focus of chronic microhemorrhage in the right brainstem may correspond to the area gliosis seen on the head CT. This is unchanged from 08/12/2017. Electronically Signed   By: Ulyses Jarred M.D.    On: 09/05/2017 13:55   Ct Head Code Stroke Wo Contrast  Result Date: 09/05/2017 CLINICAL DATA:  Code stroke. 72 year old female with altered mental status. EXAM: CT HEAD WITHOUT CONTRAST TECHNIQUE: Contiguous axial images were obtained from the base of the skull through the vertex without intravenous contrast. COMPARISON:  Brain MRI 08/12/2017. Head CT without contrast 10/24/2016, and earlier. FINDINGS: Brain: Stable cerebral volume since 2018. Patchy bilateral cerebral white matter hypodensity is stable. 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 Small chronic left thalamic lacune is evident. Questionable new small area of hypodensity in the right pons (series 3, image 12), but could be artifact. No midline shift, ventriculomegaly, mass effect, evidence of mass lesion, intracranial hemorrhage or evidence of cortically based acute infarction. Vascular: Calcified atherosclerosis at the skull base. No suspicious intracranial vascular hyperdensity. Skull: Stable, negative. Sinuses/Orbits: Visualized paranasal sinuses and mastoids are stable and well pneumatized. Other: Chronic postoperative  changes to the left globe. No acute orbit or scalp soft tissue findings. Negative visible noncontrast deep soft tissue spaces of the face. ASPECTS [facility] Stroke Program Early CT Score) - Ganglionic level infarction (caudate, lentiform nuclei, internal capsule, insula, M1-M3 cortex): 7 - Supraganglionic infarction (M4-M6 cortex): 3 Total score (0-10 with 10 being normal): 10 IMPRESSION: 1. Questionable new hypodense lacune in the right pons, but may be artifact. 2. Otherwise stable chronic small vessel ischemia. No acute intracranial hemorrhage or cortically based infarct. 3. ASPECTS is 10. 4. These results were communicated to Dr. Rory Percy at 12:47 pmon 8/16/2019by text page via the [facility] messaging system. Electronically Signed   By: Genevie [name] M.D.   On: 09/05/2017 12:47        Scheduled Meds: .  calcium carbonate  500 mg of elemental calcium Oral Q breakfast  . ezetimibe  10 mg Oral Daily  . fluticasone  2 spray Each Nare Daily  . fluticasone furoate-vilanterol  1 puff Inhalation Daily  . gabapentin  300 mg Oral BID  . insulin aspart  0-9 Units Subcutaneous TID WC  . insulin detemir  8 Units Subcutaneous Daily  . montelukast  10 mg Oral QHS  . pantoprazole  40 mg Oral Daily  . pneumococcal 23 valent vaccine  0.5 mL Intramuscular Tomorrow-1000  . potassium chloride SA  20 mEq Oral Daily  . sucralfate  1 g Oral BID   Continuous Infusions: . sodium chloride 75 mL/hr at 09/07/17 0836     LOS: 0 days    Time spent: 35 minutes    Edwin Dada, MD [facility] 09/07/2017, 8:51 AM     Pager [phone] --- please page though AMION:  [email or web address] If 7PM-7AM, please contact night-coverage

## 2017-09-07 NOTE — Progress Notes (Signed)
Patient eating lunch and preferred to wait on echo.  Will attempt at a later time

## 2017-09-07 NOTE — Progress Notes (Signed)
OT Cancellation Note  Patient Details Name: Joann Gutierrez MRN: [id number] DOB: [birth year]   Cancelled Treatment:    Reason Eval/Treat Not Completed: Other (comment): Pt sleeping soundly on my arrival and unable to arouse to participate in OT evaluation at this time. Note PT recommendation for SNF level rehabilitation. Will check back as able to proceed with evaluation.   Norman Herrlich, MS OTR/L  Pager: [place] A Zondra Lawlor 09/07/2017, 7:15 AM

## 2017-09-07 NOTE — Progress Notes (Signed)
Await echo  Not yet done  Will see again in am with these results

## 2017-09-08 ENCOUNTER — Inpatient Hospital Stay (HOSPITAL_COMMUNITY): Payer: Medicare Other

## 2017-09-08 DIAGNOSIS — I361 Nonrheumatic tricuspid (valve) insufficiency: Secondary | ICD-10-CM

## 2017-09-08 DIAGNOSIS — R079 Chest pain, unspecified: Secondary | ICD-10-CM

## 2017-09-08 LAB — GLUCOSE, CAPILLARY
GLUCOSE-CAPILLARY: 323 mg/dL — AB (ref 70–99)
GLUCOSE-CAPILLARY: 120 mg/dL — AB (ref 70–99)
Glucose-Capillary: 222 mg/dL — ABNORMAL HIGH (ref 70–99)
Glucose-Capillary: 132 mg/dL — ABNORMAL HIGH (ref 70–99)

## 2017-09-08 LAB — BASIC METABOLIC PANEL
ANION GAP: 9 (ref 5–15)
BUN: 59 mg/dL — AB (ref 8–23)
CHLORIDE: 104 mmol/L (ref 98–111)
CO2: 23 mmol/L (ref 22–32)
Calcium: 7.1 mg/dL — ABNORMAL LOW (ref 8.9–10.3)
Creatinine, Ser: 4.06 mg/dL — ABNORMAL HIGH (ref 0.44–1.00)
GFR calc Af Amer: 12 mL/min — ABNORMAL LOW (ref >60)
GFR calc non Af Amer: 10 mL/min — ABNORMAL LOW (ref >60)
Glucose, Bld: 209 mg/dL — ABNORMAL HIGH (ref 70–99)
POTASSIUM: 4.6 mmol/L (ref 3.5–5.1)
SODIUM: 136 mmol/L (ref 135–145)

## 2017-09-08 LAB — CBC
HCT: 28.9 % — ABNORMAL LOW (ref 36.0–46.0)
HEMOGLOBIN: 8.7 g/dL — AB (ref 12.0–15.0)
MCH: 22.8 pg — ABNORMAL LOW (ref 26.0–34.0)
MCHC: 30.1 g/dL (ref 30.0–36.0)
MCV: 75.7 fL — AB (ref 78.0–100.0)
Platelets: 160 10*3/uL (ref 150–400)
RBC: 3.82 MIL/uL — ABNORMAL LOW (ref 3.87–5.11)
RDW: 19.8 % — ABNORMAL HIGH (ref 11.5–15.5)
WBC: 5.5 10*3/uL (ref 4.0–10.5)

## 2017-09-08 LAB — ECHOCARDIOGRAM COMPLETE
Height: 64.5 in
WEIGHTICAEL: 4197.56 [oz_av]

## 2017-09-08 LAB — AMMONIA: AMMONIA: 30 umol/L (ref 9–35)

## 2017-09-08 LAB — ANA: ANA: NEGATIVE

## 2017-09-08 MED ORDER — INSULIN ASPART 100 UNIT/ML ~~LOC~~ SOLN
0.0000 [IU] | Freq: Every day | SUBCUTANEOUS | Status: DC
  Administered 2017-09-09: 3 [IU] via SUBCUTANEOUS

## 2017-09-08 MED ORDER — ATORVASTATIN CALCIUM 40 MG PO TABS
40.0000 mg | ORAL_TABLET | Freq: Every day | ORAL | Status: DC
  Administered 2017-09-08: 40 mg via ORAL
  Filled 2017-09-08: qty 1

## 2017-09-08 MED ORDER — INSULIN ASPART 100 UNIT/ML ~~LOC~~ SOLN
0.0000 [IU] | Freq: Three times a day (TID) | SUBCUTANEOUS | Status: DC
  Administered 2017-09-08: 11 [IU] via SUBCUTANEOUS
  Administered 2017-09-09 (×3): 5 [IU] via SUBCUTANEOUS
  Administered 2017-09-10 (×2): 3 [IU] via SUBCUTANEOUS

## 2017-09-08 MED ORDER — INSULIN DETEMIR 100 UNIT/ML ~~LOC~~ SOLN
14.0000 [IU] | Freq: Every day | SUBCUTANEOUS | Status: DC
  Administered 2017-09-08: 14 [IU] via SUBCUTANEOUS
  Filled 2017-09-08 (×2): qty 0.14

## 2017-09-08 MED ORDER — ASPIRIN EC 325 MG PO TBEC
325.0000 mg | DELAYED_RELEASE_TABLET | Freq: Every day | ORAL | Status: DC
  Administered 2017-09-08 – 2017-09-12 (×5): 325 mg via ORAL
  Filled 2017-09-08 (×5): qty 1

## 2017-09-08 NOTE — Progress Notes (Signed)
MD- Please consider starting Novolog Meal [place]:  Novolog 3 units TID with meals   (Please add the following Hold Parameters: Hold if pt eats <50% of meal, Hold if pt NPO)  Please also consider starting Novolog SSI regimen for home- Would use the Sensitive hospital scale (0-9 units) TID AC      Met with pt today.  Spoke with patient about her current A1c of 11.5%.  Pt told me her PCP ([name]) stopped her home Novolog SSi about 1 year ago.  Pt unsure why PCP did this.  Has been taking Levemir once daily, but will take the Levemir sometimes 2-3 times per day when her CBGs run high at home.    Discussed with pt that she should not take the Levemir 2-3 times per day as she has been doing.  Instead discussed with pt that it may be necessary to restart the Novolog SSI TID AC at home like she was taking 1 year ago in addition to her Levemir.  Pt stated she would be agreeable to taking Novolog again at home, but she will need a Rx from the MD in the hospital.  Told pt I will discuss this matter with the Attending MD.  Explained to pt  what an A1c is and what it measures.  Reminded patient that her goal A1c is 7% or less per ADA standards to prevent both acute and long-term complications.  Explained to patient the extreme importance of good glucose control at home.  Encouraged patient to check her CBGs at least TID AC at home and to record all CBGs in a logbook for her PCP to review.      --Will follow patient during hospitalization--  Wyn Quaker RN, MSN, CDE Diabetes Coordinator Inpatient Glycemic Control Team Team Pager: [phone] (8a-5p)

## 2017-09-08 NOTE — Progress Notes (Signed)
  Echocardiogram 2D Echocardiogram has been performed.  Joann Gutierrez 09/08/2017, 3:58 PM

## 2017-09-08 NOTE — Progress Notes (Signed)
Inpatient Diabetes Program Recommendations  AACE/ADA: New Consensus Statement on Inpatient Glycemic Control (2015)  Target Ranges:  Prepandial:   less than 140 mg/dL      Peak postprandial:   less than 180 mg/dL (1-2 hours)      Critically ill patients:  140 - 180 mg/dL   Results for RANDALL, RAMPERSAD (MRN [id number]) as of 09/08/2017 09:51  Ref. Range 09/07/2017 08:40 09/07/2017 12:30 09/07/2017 17:47 09/07/2017 21:37  Glucose-Capillary Latest Ref Range: 70 - 99 mg/dL 218 (H)  3 units NOVOLOG +  8 units LEVEMIR  158 (H)  2 units NOVOLOG  201 (H)  3 units NOVOLOG  179 (H)   Results for LAYLANA, GERWIG (MRN [id number]) as of 09/08/2017 09:51  Ref. Range 09/08/2017 08:42  Glucose-Capillary Latest Ref Range: 70 - 99 mg/dL 222 (H)   Results for JAQUANA, GEIGER (MRN [id number]) as of 09/08/2017 09:51  Ref. Range 09/06/2017 03:08  Hemoglobin A1C Latest Ref Range: 4.8 - 5.6 % 11.5 (H)  Average glucose 283 mg/dl   Admit with: CP  History: DM, CHF, CKD  Home DM Meds: Levemir 15 units daily  Current Orders: Levemir 14 units daily     Novolog Moderate Correction Scale/ SSI (0-15 units) TID AC + HS     Note Levemir increased today.  Pt received 8 units Levemir yesterday and received 14 units Levemir this AM.  Novolog increased to Moderate scale today.  Current A1c of 11.5% shows poor glucose control at home.  Listed PCP is Dr. Seward Carol.  Plan to speak with pt about her current A1c.     --Will follow patient during hospitalization--  Wyn Quaker RN, MSN, CDE Diabetes Coordinator Inpatient Glycemic Control Team Team Pager: [phone] (8a-5p)

## 2017-09-08 NOTE — Progress Notes (Signed)
Progress Note  Patient Name: Joann Gutierrez Date of Encounter: 09/08/2017  Primary Cardiologist: Dr Percival Spanish  Subjective   "A little chest pain"; no dyspnea  Inpatient Medications    Scheduled Meds: . calcium carbonate  500 mg of elemental calcium Oral Q breakfast  . ezetimibe  10 mg Oral Daily  . fluticasone  2 spray Each Nare Daily  . fluticasone furoate-vilanterol  1 puff Inhalation Daily  . gabapentin  300 mg Oral BID  . insulin aspart  0-15 Units Subcutaneous TID WC  . insulin aspart  0-5 Units Subcutaneous QHS  . insulin detemir  14 Units Subcutaneous Daily  . montelukast  10 mg Oral QHS  . pantoprazole  40 mg Oral Daily  . potassium chloride SA  20 mEq Oral Daily  . sucralfate  1 g Oral BID   Continuous Infusions: . sodium chloride 75 mL/hr at 09/08/17 0206   PRN Meds: acetaminophen **OR** acetaminophen, albuterol, guaiFENesin, labetalol, nitroGLYCERIN, ondansetron **OR** ondansetron (ZOFRAN) IV   Vital Signs    Vitals:   09/07/17 1938 09/07/17 2208 09/07/17 2336 09/08/17 0327  BP: (!) 139/54  (!) 138/52 (!) 122/56  Pulse: 76 72  70  Resp: 15  20 18   Temp: (!) 97.4 F (36.3 C)     TempSrc: Oral     SpO2: 100% 99%  95%  Weight:      Height:        Intake/Output Summary (Last 24 hours) at 09/08/2017 0745 Last data filed at 09/08/2017 0400 Gross per 24 hour  Intake 2619.27 ml  Output 200 ml  Net 2419.27 ml   Filed Weights   09/04/17 1959 09/05/17 0228  Weight: 121.1 kg 119 kg    Telemetry    Sinus - Personally Reviewed   Physical Exam   GEN: No acute distress.   Neck: No JVD Cardiac: RRR, no murmurs, rubs, or gallops.  Respiratory: Clear to auscultation bilaterally. Chest pain reproduced with palpation GI: Soft, mild tenderness MS: No edema Neuro:  Nonfocal  Psych: Normal affect   Labs    Chemistry Recent Labs  Lab 09/04/17 2139 09/05/17 0339 09/06/17 0308 09/07/17 0256 09/08/17 0351  NA 140 143 143 138 136  K 3.6 4.0  4.0 4.3 4.6  CL 103 106 104 104 104  CO2 27 26 27 26 23   GLUCOSE 133* 138* 84 238* 209*  BUN 38* 32* 38* 48* 59*  CREATININE 1.84* 1.61* 2.45* 3.45* 4.06*  CALCIUM 8.9 8.8* 8.7* 7.7* 7.1*  PROT 7.2 7.0  --   --   --   ALBUMIN 3.3* 3.2*  --   --   --   AST 17 14*  --   --   --   ALT 9 8  --   --   --   ALKPHOS 82 85  --   --   --   BILITOT 0.6 0.9  --   --   --   GFRNONAA 26* 31* 19* 12* 10*  GFRAA 30* 36* 22* 14* 12*  ANIONGAP 10 11 12 8 9      Hematology Recent Labs  Lab 09/05/17 0339 09/07/17 0256 09/08/17 0351  WBC 8.2 5.7 5.5  RBC 4.86 4.28 3.82*  HGB 10.7* 9.6* 8.7*  HCT 36.4 32.0* 28.9*  MCV 74.9* 74.8* 75.7*  MCH 22.0* 22.4* 22.8*  MCHC 29.4* 30.0 30.1  RDW 21.6* 20.8* 19.8*  PLT 210 179 160    Cardiac Enzymes Recent Labs  Lab 09/05/17 0931  09/05/17 1538 09/05/17 2103 09/06/17 0308  TROPONINI <0.03 <0.03 <0.03 <0.03    BNP Recent Labs  Lab 09/04/17 2139  BNP 58.5      Radiology    Ct Head Wo Contrast  Result Date: 09/08/2017 CLINICAL DATA:  Initial evaluation for acute altered mental status. EXAM: CT HEAD WITHOUT CONTRAST TECHNIQUE: Contiguous axial images were obtained from the base of the skull through the vertex without intravenous contrast. COMPARISON:  Previous MRI from 09/05/2017 FINDINGS: Brain: Atrophy with chronic small vessel ischemic disease. Small remote left cerebellar infarct. No acute intracranial hemorrhage. No acute large vessel territory infarct. No mass lesion, midline shift or mass effect. No hydrocephalus. No extra-axial fluid collection. Vascular: No hyperdense vessel. Scattered vascular calcifications noted within the carotid siphons. Skull: Scalp soft tissues and calvarium within normal limits. Sinuses/Orbits: Globes and orbital soft tissues demonstrate no acute abnormality. Trace layering opacity within the left sphenoid sinus. Paranasal sinuses otherwise clear. No mastoid effusion. Other: None. IMPRESSION: 1. No acute  intracranial abnormality. 2. Stable atrophy with chronic small vessel ischemic disease. Small remote left cerebellar infarct. Electronically Signed   By: Jeannine Boga M.D.   On: 09/08/2017 02:28   US Renal  Result Date: 09/07/2017 CLINICAL DATA:  Acute renal failure. EXAM: RENAL / URINARY TRACT ULTRASOUND COMPLETE COMPARISON:  Body CT 09/05/2017 FINDINGS: Right Kidney: Length: 10.2 cm. Echogenicity within normal limits. No solid mass or hydronephrosis visualized. Benign-appearing cyst in the midpole region of the right kidney measures 1.8 cm in diameter. Left Kidney: Length: 10.7 cm. Echogenicity within normal limits. No solid mass or hydronephrosis visualized. Benign-appearing cyst in the lower pole of the left kidney measures 3.4 cm in diameter. Bladder: Not seen. IMPRESSION: The exam was limited due to patient's body habitus. Bilateral benign-appearing renal cyst, otherwise normal appearance of the kidneys. The urinary bladder was not visualized. Electronically Signed   By: Fidela Salisbury M.D.   On: 09/07/2017 18:09    Patient Profile     72 y.o. female with past medical history of CVA, hypertension, hyperlipidemia, diabetes mellitus, COPD, chronic kidney disease, seizures admitted with chest and abdominal pain.  Assessment & Plan    1 chest pain-symptoms are reproduced with palpation.  Enzymes are negative and not consistent with acute coronary syndrome.  No plans for further ischemia evaluation.  2 question history of hypertrophic cardiomyopathy-repeat echocardiogram is pending.  3 acute on chronic stage III kidney disease-renal function continues to deteriorate.  Possibly related to transient hypotension with hypoperfusion.  Also with CTA on day of admission.  Would continue to hold ARB and diuretic.  Needs nephrology evaluation.  4 hypertension-blood pressure medications on hold.  Would adjust based on follow-up readings.  If echocardiogram shows normal LV function we will  not pursue further cardiac evaluation while in-house.  [facility] will sign off.   Medication Recommendations: Would follow blood pressure and resume lower dose blood pressure medications as needed once renal function improves. Other recommendations (labs, testing, etc): No further testing. Follow up as an outpatient: Follow-up Dr. Percival Spanish 4 to 6 weeks after discharge.  For questions or updates, please contact [place] Please consult [email or web address] for contact info under Cardiology/STEMI.      Signed, Kirk Ruths, MD  09/08/2017, 7:45 AM

## 2017-09-08 NOTE — Evaluation (Signed)
Occupational Therapy Evaluation Patient Details Name: Joann Gutierrez MRN: [id number] DOB: 08/14/1945 Today's Date: 09/08/2017    History of Present Illness Pt is a 72 y/o female admitted secondary to chest pain and weakness. MRI of the brain was negative. Pt found to have toxic metabolic encephalopathy versus TIA. PMH including but not limited to CAD, CHF, COPD, CKD, CVA, DM, HLD, HTN, seizures, sickle cell trait.   Clinical Impression   PTA, pt was living at home with her husband who performed IADLs and assisted with LB ADLs. Pt currently requiring Mod A for UB ADLs, Max A for LB ADLs, and Mod A for functional transfers with RW. Pt currently presenting with decreased strength, balance, and activity tolerance. Pt also presenting with decreased cognition compared to baseline and requiring increased time and cues for ADLs and functional mobility. Pt would benefit from further acute OT to facilitate safe dc. Recommend dc to SNF for further OT to optimize safety, independence with ADLs, and return to PLOF.      Follow Up Recommendations  SNF;Supervision/Assistance - 24 hour(Would benefit from [facility] if qualifies.)    Equipment Recommendations  Other (comment)(Defer to next venue)    Recommendations for Other Services PT consult     Precautions / Restrictions Precautions Precautions: Fall Restrictions Weight Bearing Restrictions: No      Mobility Bed Mobility Overal bed mobility: Needs Assistance Bed Mobility: Supine to Sit     Supine to sit: Min assist     General bed mobility comments: Pt able to bring BLEs towards EOB. Using bed rail to pull with RUE and requiring cues to use LUE and pull trunk towards EOB. Min A to initate trunk upright  Transfers Overall transfer level: Needs assistance Equipment used: Rolling walker (2 wheeled) Transfers: Sit to/from [facility] Sit to Stand: Mod assist;From elevated surface Stand pivot transfers: Min assist        General transfer comment: Mod A for sit<>stand and power up. Pt presenting with decreased strength. Min A for safety and balance during pivot to recliner    Balance Overall balance assessment: Needs assistance Sitting-balance support: Feet supported Sitting balance-[name]: Fair     Standing balance support: Bilateral upper extremity supported Standing balance-[name]: Poor Standing balance comment: Reliant on UE support                           ADL either performed or assessed with clinical judgement   ADL Overall ADL's : Needs assistance/impaired Eating/Feeding: Set up;Sitting   Grooming: Sitting;Supervision/safety;Set up   Upper Body Bathing: Moderate assistance;Sitting   Lower Body Bathing: Maximal assistance;Sit to/from stand   Upper Body Dressing : Moderate assistance;Sitting   Lower Body Dressing: Maximal assistance;Sit to/from stand   Toilet Transfer: Minimal assistance;Stand-pivot;RW(Simulated to recliner) Toilet Transfer Details (indicate cue type and reason): Min A for safety and balance.          Functional mobility during ADLs: Minimal assistance;Rolling walker(stand pivot onyl) General ADL Comments: Pt presenting with decreased strength, balance, and activity tolerance. Pt stating "I feel wobbly"     Vision Baseline Vision/History: Wears glasses Wears Glasses: At all times;Reading only Patient Visual Report: No change from baseline Vision Assessment?: Yes Eye Alignment: Within Functional Limits Ocular Range of Motion: Within Functional Limits Alignment/Gaze Preference: Within Defined Limits Tracking/Visual Pursuits: Able to track stimulus in all quads without difficulty Visual Fields: No apparent deficits Depth Perception: Undershoots     Perception  Praxis      Pertinent Vitals/Pain Pain Assessment: Faces Faces Pain Scale: Hurts little more Pain Location: "Bottom on stomach" Pain Descriptors / Indicators: ("A hard  pain") Pain Intervention(s): Monitored during session;Repositioned     Hand Dominance Right   Extremity/Trunk Assessment Upper Extremity Assessment Upper Extremity Assessment: RUE deficits/detail;LUE deficits/detail RUE Deficits / Details: Decreased strength and coordination as seen during ADLs and testing. During finger to nose test, pt requiring increased time presenting with undershooting.  RUE Coordination: decreased fine motor;decreased gross motor LUE Deficits / Details: Decreased strength and coordination as seen during ADLs and testing. During finger to nose test, pt requiring increased time presenting with undershooting.  LUE Coordination: decreased fine motor;decreased gross motor   Lower Extremity Assessment Lower Extremity Assessment: Generalized weakness   Cervical / Trunk Assessment Cervical / Trunk Assessment: Other exceptions Cervical / Trunk Exceptions: Increased bosy habitus   Communication Communication Communication: HOH   Cognition Arousal/Alertness: Awake/alert Behavior During Therapy: WFL for tasks assessed/performed Overall Cognitive Status: Impaired/Different from baseline Area of Impairment: Orientation;Attention;Memory;Following commands;Safety/judgement;Awareness;Problem solving                 Orientation Level: Disoriented to;Time(Unable to say what year it is) Current Attention Level: Sustained Memory: Decreased short-term memory Following Commands: Follows one step commands inconsistently;Follows one step commands with increased time Safety/Judgement: Decreased awareness of safety;Decreased awareness of deficits Awareness: Intellectual Problem Solving: Slow processing;Requires verbal cues;Requires tactile cues General Comments: Pt requiring increased time and cues thorughout session. Able to follow simple commands. Pt able to state she is in the hospital and that it is summer time.    General Comments  VSS. RN present at begining of  session    Exercises     Shoulder Instructions      Home Living Family/patient expects to be discharged to:: Private residence Living Arrangements: Spouse/significant other Available Help at Discharge: Family;Available 24 hours/day Type of Home: House Home Access: Stairs to enter [employer] of Steps: 4 Entrance Stairs-Rails: Right;Left Home Layout: One level     Bathroom Shower/Tub: Teacher, early years/pre: Standard     Home Equipment: Clinical cytogeneticist - 2 wheels;Cane - single point;Toilet riser   Additional Comments: Pt providing information and it matches information in chart      Prior Functioning/Environment Level of Independence: Needs assistance  Gait / Transfers Assistance Needed: ambulates with SPC at all times ADL's / Homemaking Assistance Needed: Husband assists with LB ADLs and tub transfer. Husband performs all IADLs including cooking, cleaning, and driving.    Comments: home O2 2L        OT Problem List: Decreased strength;Decreased range of motion;Decreased activity tolerance;Impaired balance (sitting and/or standing);Decreased knowledge of use of DME or AE;Decreased knowledge of precautions;Pain;Decreased cognition;Decreased coordination      OT Treatment/Interventions: Self-care/ADL training;Therapeutic exercise;Energy conservation;DME and/or AE instruction;Therapeutic activities;Patient/family education    OT Goals(Current goals can be found in the care plan section) Acute Rehab OT Goals Patient Stated Goal: to get stronger OT Goal Formulation: With patient Time For Goal Achievement: 09/22/17 Potential to Achieve Goals: Good ADL Goals Pt Will Perform Grooming: with min guard assist;standing Pt Will Perform Upper Body Dressing: with min guard assist;sitting Pt Will Perform Lower Body Dressing: sit to/from stand;with adaptive equipment;with min assist Pt Will Transfer to Toilet: with min assist;ambulating;bedside commode Pt  Will Perform Toileting - Clothing Manipulation and hygiene: with min assist;with adaptive equipment;sit to/from stand Additional ADL Goal #1: Pt will perform bed mobility with supervision  in prepartion for ADLs  OT Frequency: Min 2X/week   Barriers to D/C:            Co-evaluation              AM-PAC PT "6 Clicks" Daily Activity     Outcome Measure Help from another person eating meals?: None Help from another person taking care of personal grooming?: A Little Help from another person toileting, which includes using toliet, bedpan, or urinal?: A Lot Help from another person bathing (including washing, rinsing, drying)?: A Lot Help from another person to put on and taking off regular upper body clothing?: A Lot Help from another person to put on and taking off regular lower body clothing?: A Lot 6 Click Score: 15   End of Session Equipment Utilized During Treatment: Gait belt;Rolling walker Nurse Communication: Mobility status  Activity Tolerance: Patient tolerated treatment well Patient left: in chair;with call bell/phone within reach;Other (comment)(Chair alarm in place. No green box. RN notified)  OT Visit Diagnosis: Unsteadiness on feet (R26.81);Other abnormalities of gait and mobility (R26.89);Muscle weakness (generalized) (M62.81);Pain Pain - part of body: (Stomach)                Time: 7414-2395 OT Time Calculation (min): 23 min Charges:  OT General Charges $OT Visit: 1 Visit OT Evaluation $OT Eval Moderate Complexity: 1 Mod OT Treatments $Self Care/Home Management : 8-22 mins  Yarima Penman MSOT, OTR/L Acute Rehab Pager: [phone] Office: [place] 09/08/2017, 8:59 AM

## 2017-09-08 NOTE — Progress Notes (Signed)
PROGRESS NOTE    Joann Gutierrez  IHK:[id number] DOB: 1946/01/08 DOA: 09/04/2017 PCP: Joann Carol, MD      Brief Narrative:  Joann Gutierrez is a 72 y.o. F with complex PMHx, COPD not on home O2, HTN, CKD III baseline Cr 1.8, dCHF, OSA, DM, chronic anemia, gastroparesis, and chronic widespread pain complaints who presented initially with complaints of "'knots' on her left neck, under left breast, and on left side of abdomen" for several weeks, then family added reports of "more confused than usually" and "mild odor" to urine, then to second ED provider reported abdominal pain "like there is a mass in her left upper quadrant and right lower quadrant.  Ongoing for 2 weeks".  As a result of her abdominal pain, a troponin was obtained that was 0.03 ng/mL, after which the patient was asked a third time if she had chest pain and this time said yes (had been previously documented as having no chest pain), and so TRH were asked to observe for cardiac rule out.  Shortly after admission to the hospital, the patient had an episode of hypotension and altered mental status.  CODE STROKE was called by nursing, and Neurology were consulted.        Assessment & Plan:  Acute renal failure with oliguria Not the presenting complaint, has developed since admission.    Baseline Cr 1.6, worse again today, now 106.  Ins and outs not recorded.  Renal ultrasound shows chronic renal disease no hydronephrosis.    Got contrast on day of admission.  Was hypertensive, taking ARB, Lasix, metolazone, then had transient 90 minute hypotensive episode two days ago (believed to be vagal episode).  Has low-grade macroalbuminemia, no white blood cells or red blood cells on microscopy. -Hold losartan, Lasix, metolazone -Soft blood pressure last 2 days, hold blood pressure medicines for now -Continue IVF -Trend BMP -Consult Nephrology, appreciate expert guidance   Abdominal pain See below, the patient has  widespread soft tissue pain.  Her LFTs are normal and CT abdomen on admission and then again yesterday in context of her hypotension were completely normal. The etiology of this is unclear, but no indications to suggest infection, intra-abdominal perforation, peritonitis or hemorrhage.  Hypotension, transient This occurred 8/16 in the following context: patient complained to nursing of chest pain, was given nitro x2 which did not relieve her pain, followed by which she had hypotension, became less responsive globally, and CODE STROKE was called.  Her hypotension resolved spontaneously after about 90 minutes without fluids or other intervention and in fact she became hypertensive again that night when her oral medications were held.  Elevated troponin ACS is not present.  Cardiology were consulted, they recommend echocardiogram.  Cardiology did not recommend an ischemic work-up, they have signed off. -Echocardiogram pending  Possible TIA, doubted Her episode of decreased responsiveness on the day of admission occurred in the context of hypotension, and appeared to me to be simple presyncope.  However, I agree that TIA is not possible to rule out, given nursing initial observation of left sided weakness. -MRI negative for stroke -Echocardiogram pending -Carotid imaging unremarkable -Lipids ordered, LDL elevated, but statin held with muscle pain -Aspirin ordered   -Atrial fibrillation: not present -tPA not given because symptoms resolved -Dysphagia screen ordered  -PT eval ordered  COPD  No symptoms -Continue home ICS/LABA, Flonase -Bronchodilators as needed -Singulair and PPI continued     Hypertension Blood pressure trending up today -Hold losartan, Lasix, metolazone -Hold doxazosin, carvedilol, hydralazine,  Imdur -Labetalol as needed for blood pressure greater than 170 -Continue Zetia -Trial off statin (as above).     Chronic abdominal pain -Continue  sucralfate  Diabetes Glucose is more elevated today -Continue Levemir, increase dose back  -Continue SSI, increased dose   Anemia of chronic kidney disease Stable Hgb  Chest pain Widespread soft tissue pain This was the reason for admission, although not the chief presenting complaint (she had an incidentally noted detectable troponin, was subsequently asked if she has chest pain, which she does, chronically, as well as widespread soft tissue pain, I believe (she and husband are vague historians, but this is supported by previous admission histories/exams).   The differential for this would probably be fibromyalgia, statin-induced myopathy, and inflammatory myopathies.  Former seems unlikely to develop this stage in life I would think, and latter seem unlikely with normal CK.  TSH normal.  ANA  pending.  ESR minimal elevation.  Her chest pain is clearly reproducible on exam.  Cardiology believe this is chest wall pain from arthritis, I agree.    -Trial off statin, follow up with PCP  OSA -Continue CPAP at night    DVT prophylaxis: Enoxaparin Code Status: FULL Family Communication: Husband at bedside MDM and disposition Plan: The below labs and imaging reports were reviewed and summarized above.  Medication management as above.  The patient initially presented for "skin knots", but then was found to have an unexplained troponin elevation, and chronic chest pain.  While in observation for chest pain and elevated troponin, she developed hypotensive episode followed by acute renal failure.    Her renal failure appears to be from hypotension, contrast administration in the setting of ARB and diuretic use.  Likely has an ischemic ATN, oliguric unfortunately at the moment.       Consultants:   Cardiology  Neurology  Nephrology   Procedures:   CT head  MRI head  Carotid US  Echocardiogram  Renal ultrasound    Antimicrobials:   None    Subjective: Still with  widespread soft tissue pain.  Overnight there is an episode in which the patient was less responsive.  This happened at midnight, husband said this is what she is usually like when she is sleepy.  Nursing were however concerned that she was lethargic and so CT of the head was obtained which was unremarkable.  She had no fever, respiratory distress, new pain complaints, vomiting, focal weakness.     Objective: Vitals:   09/07/17 1938 09/07/17 2208 09/07/17 2336 09/08/17 0327  BP: (!) 139/54  (!) 138/52 (!) 122/56  Pulse: 76 72  70  Resp: _0 Temp: (!) 97.4 F (36.3 C)     TempSrc: Oral     SpO2: 100% 99%  95%  Weight:      Height:        Intake/Output Summary (Last 24 hours) at 09/08/2017 0809 Last data filed at 09/08/2017 0400 Gross per 24 hour  Intake 2619.27 ml  Output 200 ml  Net 2419.27 ml   Filed Weights   09/04/17 1959 09/05/17 0228  Weight: 121.1 kg 119 kg    Examination: General appearance: Obese adult female, lying in bed, no acute distress.  CPAP on, resting. HEENT: Anicteric, conjunctival pink, lids and lashes normal.  No nasal deformity, discharge or epistaxis.  Lips moist, dentures in place.  Oropharynx moist, no tongue biting, no oral lesions.  Hearing very diminished.   Skin: Skin warm and dry, I do  not appreciate skin dots anywhere. Cardiac: RRR, no murmurs, JVP not visible, no lower extremity pitting edema. Respiratory: Normal respiratory rate and rhythm, lungs clear without rales or wheezes. Abdomen: Abdomen tender anywhere that I palpate, she has voluntary guarding, there is no distention, rigidity, rebound MSK: No deformities or effusions. Neuro: Awake and alert, cranial nerves III through XII intact, extraocular movements intact, moves all extremities with global weakness, symmetrically.Marland Kitchen    Psych: Attention normal, affect blunted, judgment and insight appear impaired    Data Reviewed: I have personally reviewed following labs and imaging  studies:  CBC: Recent Labs  Lab 09/04/17 2139 09/05/17 0339 09/07/17 0256 09/08/17 0351  WBC 7.5 8.2 5.7 5.5  NEUTROABS 4.3 4.6  --   --   HGB 10.4* 10.7* 9.6* 8.7*  HCT 34.8* 36.4 32.0* 28.9*  MCV 75.7* 74.9* 74.8* 75.7*  PLT 200 210 179 037   Basic Metabolic Panel: Recent Labs  Lab 09/04/17 2139 09/05/17 0339 09/06/17 0308 09/07/17 0256 09/08/17 0351  NA 140 143 143 138 136  K 3.6 4.0 4.0 4.3 4.6  CL 103 106 104 104 104  CO2 _0 GLUCOSE 133* 138* 84 238* 209*  BUN 38* 32* 38* 48* 59*  CREATININE 1.84* 1.61* 2.45* 3.45* 4.06*  CALCIUM 8.9 8.8* 8.7* 7.7* 7.1*   GFR: Estimated Creatinine Clearance: 16 mL/min (A) (by C-G formula based on SCr of 4.06 mg/dL (H)). Liver Function Tests: Recent Labs  Lab 09/04/17 2139 09/05/17 0339  AST 17 14*  ALT 9 8  ALKPHOS 82 85  BILITOT 0.6 0.9  PROT 7.2 7.0  ALBUMIN 3.3* 3.2*   Recent Labs  Lab 09/04/17 2139  LIPASE 28   Recent Labs  Lab 09/05/17 1538 09/08/17 0351  AMMONIA 36* 30   Coagulation Profile: No results for input(s): INR, PROTIME in the last 168 hours. Cardiac Enzymes: Recent Labs  Lab 09/05/17 0339 09/05/17 0931 09/05/17 1538 09/05/17 2103 09/06/17 0308  CKTOTAL  --   --  68  --   --   TROPONINI <0.03 <0.03 <0.03 <0.03 <0.03   BNP (last 3 results) No results for input(s): PROBNP in the last 8760 hours. HbA1C: Recent Labs    09/06/17 0308  HGBA1C 11.5*   CBG: Recent Labs  Lab 09/06/17 2145 09/07/17 0840 09/07/17 1230 09/07/17 1747 09/07/17 2137  GLUCAP 263* 218* 158* 201* 179*   Lipid Profile: Recent Labs    09/06/17 0308  CHOL 238*  HDL 37*  LDLCALC 166*  TRIG 174*  CHOLHDL 6.4   Thyroid Function Tests: No results for input(s): TSH, T4TOTAL, FREET4, T3FREE, THYROIDAB in the last 72 hours. Anemia Panel: No results for input(s): VITAMINB12, FOLATE, FERRITIN, TIBC, IRON, RETICCTPCT in the last 72 hours. Urine analysis:    Component Value Date/Time    COLORURINE YELLOW 09/07/2017 0857   APPEARANCEUR CLOUDY (A) 09/07/2017 0857   LABSPEC 1.019 09/07/2017 0857   PHURINE 5.0 09/07/2017 0857   GLUCOSEU NEGATIVE 09/07/2017 0857   GLUCOSEU >=1000 01/02/2009 1101   HGBUR NEGATIVE 09/07/2017 0857   HGBUR negative 03/25/2008 1022   BILIRUBINUR NEGATIVE 09/07/2017 0857   KETONESUR NEGATIVE 09/07/2017 0857   PROTEINUR 100 (A) 09/07/2017 0857   UROBILINOGEN 0.2 09/14/2013 1632   NITRITE NEGATIVE 09/07/2017 0857   LEUKOCYTESUR NEGATIVE 09/07/2017 0857   Sepsis Labs: _1 (procalcitonin:4,lacticacidven:4)  ) Recent Results (from the past 240 hour(s))  MRSA PCR Screening     Status: None   Collection Time: 09/05/17  2:42 AM  Result Value Ref Range Status   MRSA by PCR NEGATIVE NEGATIVE Final    Comment:        The GeneXpert MRSA Assay (FDA approved for NASAL specimens only), is one component of a comprehensive MRSA colonization surveillance program. It is not intended to diagnose MRSA infection nor to guide or monitor treatment for MRSA infections. Performed at [facility], [place] [address], [place], [place] [address]          Radiology Studies: Ct Head Wo Contrast  Result Date: 09/08/2017 CLINICAL DATA:  Initial evaluation for acute altered mental status. EXAM: CT HEAD WITHOUT CONTRAST TECHNIQUE: Contiguous axial images were obtained from the base of the skull through the vertex without intravenous contrast. COMPARISON:  Previous MRI from 09/05/2017 FINDINGS: Brain: Atrophy with chronic small vessel ischemic disease. Small remote left cerebellar infarct. No acute intracranial hemorrhage. No acute large vessel territory infarct. No mass lesion, midline shift or mass effect. No hydrocephalus. No extra-axial fluid collection. Vascular: No hyperdense vessel. Scattered vascular calcifications noted within the carotid siphons. Skull: Scalp soft tissues and calvarium within normal limits. Sinuses/Orbits: Globes and orbital soft  tissues demonstrate no acute abnormality. Trace layering opacity within the left sphenoid sinus. Paranasal sinuses otherwise clear. No mastoid effusion. Other: None. IMPRESSION: 1. No acute intracranial abnormality. 2. Stable atrophy with chronic small vessel ischemic disease. Small remote left cerebellar infarct. Electronically Signed   By: Jeannine Boga M.D.   On: 09/08/2017 02:28   US Renal  Result Date: 09/07/2017 CLINICAL DATA:  Acute renal failure. EXAM: RENAL / URINARY TRACT ULTRASOUND COMPLETE COMPARISON:  Body CT 09/05/2017 FINDINGS: Right Kidney: Length: 10.2 cm. Echogenicity within normal limits. No solid mass or hydronephrosis visualized. Benign-appearing cyst in the midpole region of the right kidney measures 1.8 cm in diameter. Left Kidney: Length: 10.7 cm. Echogenicity within normal limits. No solid mass or hydronephrosis visualized. Benign-appearing cyst in the lower pole of the left kidney measures 3.4 cm in diameter. Bladder: Not seen. IMPRESSION: The exam was limited due to patient's body habitus. Bilateral benign-appearing renal cyst, otherwise normal appearance of the kidneys. The urinary bladder was not visualized. Electronically Signed   By: Fidela Salisbury M.D.   On: 09/07/2017 18:09        Scheduled Meds: . calcium carbonate  500 mg of elemental calcium Oral Q breakfast  . ezetimibe  10 mg Oral Daily  . fluticasone  2 spray Each Nare Daily  . fluticasone furoate-vilanterol  1 puff Inhalation Daily  . gabapentin  300 mg Oral BID  . insulin aspart  0-15 Units Subcutaneous TID WC  . insulin aspart  0-5 Units Subcutaneous QHS  . insulin detemir  14 Units Subcutaneous Daily  . montelukast  10 mg Oral QHS  . pantoprazole  40 mg Oral Daily  . potassium chloride SA  20 mEq Oral Daily  . sucralfate  1 g Oral BID   Continuous Infusions: . sodium chloride 75 mL/hr at 09/08/17 0206     LOS: 1 day    Time spent: 35 minutes    Edwin Dada, MD [facility] 09/08/2017, 8:09 AM     Pager [phone] --- please page though AMION:  [email or web address] If 7PM-7AM, please contact night-coverage

## 2017-09-08 NOTE — Progress Notes (Signed)
STROKE TEAM PROGRESS NOTE   HISTORY OF PRESENT ILLNESS (per record) Joann A Henryhandis an 72 y.o.femaleWith PMH significant for CVA, HTN, HLD, DM, seizures, obesity, CAD, COPD presented to [facility] for CP. Code stroke was initiated on inpatient unit for left facial droop, difficulty speaking  Patient was admitted to hospital for abdominal pain and chest pain.Patient c/o 10/10 CP and was given 3 nitroglycerin.Denies any SOB, N/V or diarrhea. Code stroke was called on the unit for sudden onset of facial droop and difficulty speaking and generalized weakness.Of note patient is a poor historian. Some confusion noted.   Hospital course: CThead:Questionable new hypodense lacune in the right pons, but may be artifact. 2. Otherwise stable chronic small vessel ischemia. No acute intracranial hemorrhage or cortically based infarct.  MRI: pending  Patient has had a previous CVA, unknown what year.   Date last known well:Date:09/05/2017 Time last known well:Time:11:50 tPA Given:No:established hypodensity on CT Head. Modified Rankin:Rankin Score=1  NIHSS:8   SUBJECTIVE (INTERVAL HISTORY) No one is at the bedside.   MRI scan of the brain shows no acute infarct. Echocardiogram results are pending. ADL cholesterol is elevated 166 mg percent. Patient gives history of statin myalgias. Malignancy is elevated at 11.5. Patient feels she is back to her neurological baseline. No new complaints.    OBJECTIVE       Vitals:   09/06/17 0000 09/06/17 0300 09/06/17 0400 09/06/17 0739  BP: 106/60  (!) 148/74 (!) 156/76  Pulse: 65 64 (!) 56 (!) 58  Resp: 17 19 16 15   Temp: 98.1 F (36.7 C)  97.6 F (36.4 C) 98.2 F (36.8 C)  TempSrc: Oral  Oral Oral  SpO2: 99% 100% 99% 97%  Weight:      Height:        CBC:  LastLabs      Recent Labs  Lab 09/04/17 2139 09/05/17 0339  WBC 7.5 8.2  NEUTROABS 4.3 4.6  HGB 10.4* 10.7*  HCT 34.8* 36.4  MCV 75.7* 74.9*   PLT 200 210      Basic Metabolic Panel:  LastLabs      Recent Labs  Lab 09/05/17 0339 09/06/17 0308  NA 143 143  K 4.0 4.0  CL 106 104  CO2 26 27  GLUCOSE 138* 84  BUN 32* 38*  CREATININE 1.61* 2.45*  CALCIUM 8.8* 8.7*      Lipid Panel:  Labs(Brief)          Component Value Date/Time   CHOL 238 (H) 09/06/2017 0308   TRIG 174 (H) 09/06/2017 0308   HDL 37 (L) 09/06/2017 0308   CHOLHDL 6.4 09/06/2017 0308   VLDL 35 09/06/2017 0308   LDLCALC 166 (H) 09/06/2017 0308     HgbA1c:  RecentLabs       Lab Results  Component Value Date   HGBA1C 11.5 (H) 09/06/2017     Urine Drug Screen:  Labs(Brief)  No results found for: LABOPIA, COCAINSCRNUR, LABBENZ, AMPHETMU, THCU, LABBARB    Alcohol Level  Labs(Brief)  No results found for: [facility]    IMAGING  Ct Abdomen Pelvis Wo Contrast 09/05/2017 IMPRESSION:  1. Small focus of consolidation within the basilar right lower lobe may represent atelectasis or pneumonia.  2. No acute process of the abdomen and pelvis identified.  3. Mild cardiomegaly.  4. Diverticulosis without findings of acute diverticulitis.  5. Aortic atherosclerosis.   Dg Chest 2 View 09/04/2017 IMPRESSION: Cardiomegaly with vascular congestion.   Mr Brain Wo Contrast 09/05/2017 IMPRESSION:  1. No  acute intracranial abnormality.  2. 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  3. Single focus of chronic microhemorrhage in the right brainstem may correspond to the area gliosis seen on the head CT.   Ct Abdomen Pelvis W Contrast 09/04/2017 IMPRESSION:  Diverticulosis without diverticulitis. Stable renal cysts. No acute abnormality noted.   Ct Head Code Stroke Wo Contrast 09/05/2017 IMPRESSION:  1. Questionable new hypodense lacune in the right pons, but may be artifact.  2. Otherwise stable chronic small vessel ischemia. No acute intracranial hemorrhage or cortically based infarct.  3. ASPECTS is 10.      Transthoracic Echocardiogram - pending 00/00/00    Bilateral Carotid Dopplers 09/06/2017 Preliminary report:1-39% ICA plaquing. Vertebral artery flow is antegrade.     PHYSICAL EXAM Blood pressure (!) 156/76, pulse (!) 58, temperature 98.2 F (36.8 C), temperature source Oral, resp. rate 15, height 5' 4.5" (1.638 m), weight 119 kg, SpO2 97 %.  Neurological Exam ;  Awake  Alert oriented x 3. Normal speech and language.eye movements full without nystagmus.fundi were not visualized. Vision acuity and fields appear normal. Hearing is normal. Palatal movements are normal. Face symmetric. Tongue midline. Normal strength, tone, reflexes and coordination. Normal sensation. Gait deferred.     ASSESSMENT/PLAN [name] is a 72 y.o. female with history of CVA, HTN, HLD, DM, seizures,obesity, CAD, COPD  presenting with mild confusion, left facial droop, difficulty speaking. She did not receive IV t-PA due to established hypodensity on CT Head.  Probable TIA:   Resultant  no deficits  CT head - Questionable new hypodense lacune in the right pons, but may be artifact.   MRI head - No acute intracranial abnormality.  MRA head - not performed  Carotid Doppler - 1-39% ICA plaquing. Vertebral artery flow is antegrade.  2D Echo - pending  LDL -166  HgbA1c - 11.5  VTE prophylaxis - Lovenox  Diet - Heart healthy with thin liquids  aspirin 81 mg daily prior to admission, now on aspirin 81 mg daily  Patient counseled to be compliant with her antithrombotic medications  Ongoing aggressive stroke risk factor management  Therapy recommendations:  SNF  Disposition:  Pending  Hypertension  Stable  Permissive hypertension (OK if < 220/120) but gradually normalize in 5-7 days  Long-term BP goal normotensive  Hyperlipidemia  Lipid lowering medication PTA: Lipitor 40 mg daily and Zetia 10mg  daily.  LDL 166, goal < 70  Current lipid  lowering medication: Zetia 10 mg daily -> would resume Lipitor at 80 mg daily and add co-Q10 to reduce statin myalgias. If unable to tolerate may consider switching to the new PCS 9 inhibitor injections in the future  Continue statin at discharge  Diabetes  HgbA1c 11.5, goal < 7.0  Uncontrolled  Other Stroke Risk Factors  Advanced age  Former cigarette smoker - quit > 30 years ago  Obesity, Body mass index is 44.34 kg/m., recommend weight loss, diet and exercise as appropriate   Hx stroke/TIA  Coronary artery disease   Other Active Problems  Anemia   CKD  Possible pneumonia  PLAN   Await echo results. Add Lipitor and coenzyme Q 10. Stroke team will follow. No family available for discussion. Greater than 50% time during this 25 minute visit was spent on counseling and coordination of care about TIA and stroke risk and answered questions Antony Contras, MD Medical Director [facility] Pager: [phone]  09/08/2017 3:37 PM

## 2017-09-08 NOTE — Consult Note (Addendum)
Reason for Consult: Acute Renal Failure Referring Physician: Dr. Myrene Buddy  HPI: Joann Gutierrez is an 72 y.o. Female with a PMHx CKD III ([place] Cr 1.8, follow with Dr Posey Pronto); DM (A1c 11.5); HTN;  HFpEF (EF 60-65%); COPD; Anemia (Baseline Hgb 10-11); HLD; OSA; GERD; and Obesity who presented initially complaining of abdominal pain night of 8/15-8/16. She received and CT with contrast, which was negative. Her lab work showed an elevated troponin of 0.03 and she admitted to having chest pain; at which point she received 3 SL Nitro. She subsequently experienced an episode of hypotension lasting ~90 minutes with BP in 70-90s/40s-50s. Her blood pressure recovered spontaneously. However she was noted to have left sided arm and facial weakness during this hypotensive episode and has subsequently be worked up for TIA (work up negative thus far, echo pending).  Patient's Creatinine rose from 1.6 on 8/16 to 2.45 8/17 and her urine output declined; her ARB, Lasix, and Metolazone were held and she was started on IVF. On 8/18, Cr had risen to 3.4 and she remained oliguric; her remaining BP Meds (doxazosin, carvedilol, hydralazine, Imdur) were discontinued and UA, Ur protein were obtained showing protein and Pro/Cr of 0.36. Renal ultrasound consistent with medical-renal disease and bilateral benign cysts. On 8/19 Cr was 4.06, Antihypertensives, and Diuretics continue to be held and patient remains on IVF with oliguria.   She states she feels okay today. Her pain is improving as well as her breathing. She states she has only urinated once in the past day, which is abnormal for her. She endorses some nausea and bitter taste in her mouth. She has no other complaints today.  Past Medical History:  Diagnosis Date  . Adenomatous colon polyp   . Allergy   . Anemia   . Asthma       . CAD (coronary artery disease)    Mild very minimal coronary disease with 20% obtuse marginal stenosis  . Carpal tunnel  syndrome on left   . CHF (congestive heart failure) ([place])   . Chronic kidney disease (CKD), stage III (moderate) (H[facility])   . COPD (chronic obstructive pulmonary disease) ([place])   . CVA (cerebral infarction)   . Depression   . 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 Diverticulosis   . 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 Esophageal dysmotility   . Esophageal stricture   . Gastritis   . Gastroparesis 08/21/2007  . GERD (gastroesophageal reflux disease)   . Hair loss   . Hearing loss of both ears   . Hernia, hiatal   . Hyperlipidemia   . Hypertension   . Morbid obesity ([place])   . Obesity   . Osteoarthritis   . OSTEOARTHRITIS 08/09/2006  . Osteoporosis   . 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 RENAL INSUFFICIENCY 02/16/2009  . 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 Seizures ([place])    pt thinks it has been several monthes since she had a seisure  . Shingles   . Sickle cell trait ([place])   . Stroke ([place])   . Tubular adenoma of colon     Past Surgical History:  Procedure Laterality Date  . ABDOMINAL HYSTERECTOMY    . ARTERY BIOPSY  01/07/2011   Procedure: MINOR BIOPSY TEMPORAL ARTERY;  Surgeon: Haywood Lasso, MD;  Location: [facility];  Service: General;  Laterality: Left;  left temporal artery biopsy  . bil foot surgery    . BREAST LUMPECTOMY  benign  . BREAST LUMPECTOMY     both breast lumps removed   . CATARACT EXTRACTION Left 06/2016   Dr. Read Drivers  . COLONOSCOPY    . COLONOSCOPY WITH PROPOFOL N/A 07/05/2015   Procedure: COLONOSCOPY WITH PROPOFOL;  Surgeon: Manus Gunning, MD;  Location: [facility];  Service: Gastroenterology;  Laterality: N/A;  . ERD  08/08/2000  . ESOPHAGEAL MANOMETRY N/A 03/13/2015   Procedure: ESOPHAGEAL MANOMETRY (EM);  Surgeon: Manus Gunning, MD;  Location: [facility];  Service: Gastroenterology;  Laterality: N/A;  . ESOPHAGOGASTRODUODENOSCOPY  06/25/2006  . ESOPHAGOGASTRODUODENOSCOPY (EGD)  WITH PROPOFOL N/A 07/05/2015   Procedure: ESOPHAGOGASTRODUODENOSCOPY (EGD) WITH PROPOFOL;  Surgeon: Manus Gunning, MD;  Location: [facility];  Service: Gastroenterology;  Laterality: N/A;  . HERNIA REPAIR    . LEFT AND RIGHT HEART CATHETERIZATION WITH CORONARY ANGIOGRAM N/A 03/03/2013   Procedure: LEFT AND RIGHT HEART CATHETERIZATION WITH CORONARY ANGIOGRAM;  Surgeon: Minus Breeding, MD;  Location: [name];  Service: Cardiovascular;  Laterality: N/A;  . REPLACEMENT TOTAL KNEE Left 1998  . UPPER GASTROINTESTINAL ENDOSCOPY      Family History  Problem Relation Age of Onset  . Liver cancer Mother        Liver Cancer  . Diabetes Mother   . Kidney disease Mother   . Heart disease Mother        age 66's  . Heart disease Father 78       MI  . Heart attack Father        died of MI when pt was 39  . Sickle cell anemia Father   . Colon cancer Brother   . Cancer Brother        Colon Cancer  . Diabetes Sister   . Kidney disease Sister   . Heart disease Sister        age 73's  . Allergies Sister   . Diabetes Sister   . Kidney disease Sister   . Heart disease Sister        age 13's  . Esophageal cancer Neg Hx   . Rectal cancer Neg Hx   . Stomach cancer Neg Hx   . Amblyopia Neg Hx   . Blindness Neg Hx   . Glaucoma Neg Hx   . Macular degeneration Neg Hx   . Retinal detachment Neg Hx   . Cataracts Neg Hx   . Strabismus Neg Hx   . Retinitis pigmentosa Neg Hx     Social History:  reports that she quit smoking about 37 years ago. She has a 5.00 pack-year smoking history. She has never used smokeless tobacco. She reports that she does not drink alcohol or use drugs.  Allergies:  Allergies  Allergen Reactions  . Morphine And Related Other (See Comments)    Family request not to be given, reports pt does not wake up when given   . Promethazine Hcl Other (See Comments)    REACTION: lethargy    Medications:  Scheduled: Current Facility-Administered Medications  (Endocrine & Metabolic):  .  insulin aspart (novoLOG) injection 0-15 Units .  insulin aspart (novoLOG) injection 0-5 Units .  insulin detemir (LEVEMIR) injection 14 Units Current Facility-Administered Medications (Cardiovascular):  .  atorvastatin (LIPITOR) tablet 40 mg .  labetalol (NORMODYNE,TRANDATE) injection 10 mg .  nitroGLYCERIN (NITROSTAT) SL tablet 0.4 mg Current Facility-Administered Medications (Respiratory):  .  albuterol (PROVENTIL) (2.5 MG/3ML) 0.083% nebulizer solution 3 mL .  fluticasone (FLONASE) 50 MCG/ACT nasal spray 2 spray .  fluticasone furoate-vilanterol (BREO ELLIPTA) 100-25 MCG/INH 1 puff .  guaiFENesin (MUCINEX) 12 hr tablet 1,200 mg .  montelukast (SINGULAIR) tablet 10 mg Current Facility-Administered Medications (Analgesics):  .  acetaminophen (TYLENOL) tablet 650 mg **OR** acetaminophen (TYLENOL) suppository 650 mg .  aspirin EC tablet 325 mg Current Facility-Administered Medications (Other):  .  calcium carbonate (OS-CAL - dosed in mg of elemental calcium) tablet 500 mg of elemental calcium .  gabapentin (NEURONTIN) capsule 300 mg .  ondansetron (ZOFRAN) tablet 4 mg **OR** ondansetron (ZOFRAN) injection 4 mg .  pantoprazole (PROTONIX) EC tablet 40 mg .  potassium chloride SA (K-DUR,KLOR-CON) CR tablet 20 mEq .  sucralfate (CARAFATE) tablet 1 g  No current outpatient medications on file.  Results for orders placed or performed during the hospital encounter of 09/04/17 (from the past 48 hour(s))  Glucose, capillary     Status: Abnormal   Collection Time: 09/06/17  4:24 PM  Result Value Ref Range   Glucose-Capillary 140 (H) 70 - 99 mg/dL  Glucose, capillary     Status: Abnormal   Collection Time: 09/06/17  9:45 PM  Result Value Ref Range   Glucose-Capillary 263 (H) 70 - 99 mg/dL   Comment 1 Notify RN   Basic metabolic panel     Status: Abnormal   Collection Time: 09/07/17  2:56 AM  Result Value Ref Range   Sodium 138 135 - 145 mmol/L   Potassium 4.3  3.5 - 5.1 mmol/L   Chloride 104 98 - 111 mmol/L   CO2 26 22 - 32 mmol/L   Glucose, Bld 238 (H) 70 - 99 mg/dL   BUN 48 (H) 8 - 23 mg/dL   Creatinine, Ser 3.45 (H) 0.44 - 1.00 mg/dL   Calcium 7.7 (L) 8.9 - 10.3 mg/dL   GFR calc non Af Amer 12 (L) >60 mL/min   GFR calc Af Amer 14 (L) >60 mL/min    Comment: (NOTE) The eGFR has been calculated using the CKD EPI equation. This calculation has not been validated in all clinical situations. eGFR's persistently <60 mL/min signify possible Chronic Kidney Disease.    Anion gap 8 5 - 15    Comment: Performed at [facility] [address], [place] [address]  CBC     Status: Abnormal   Collection Time: 09/07/17  2:56 AM  Result Value Ref Range   WBC 5.7 4.0 - 10.5 K/uL   RBC 4.28 3.87 - 5.11 MIL/uL   Hemoglobin 9.6 (L) 12.0 - 15.0 g/dL   HCT 32.0 (L) 36.0 - 46.0 %   MCV 74.8 (L) 78.0 - 100.0 fL   MCH 22.4 (L) 26.0 - 34.0 pg   MCHC 30.0 30.0 - 36.0 g/dL   RDW 20.8 (H) 11.5 - 15.5 %   Platelets 179 150 - 400 K/uL    Comment: Performed at [facility], [place] [address] [place], [place] [address]  Sedimentation rate     Status: Abnormal   Collection Time: 09/07/17  7:59 AM  Result Value Ref Range   Sed Rate 33 (H) 0 - 22 mm/hr    Comment: Performed at [facility] [address] [place], [place] [address]  Glucose, capillary     Status: Abnormal   Collection Time: 09/07/17  8:40 AM  Result Value Ref Range   Glucose-Capillary 218 (H) 70 - 99 mg/dL   Comment 1 Notify RN    Comment 2 Document in Chart   Urinalysis, Routine w  reflex microscopic     Status: Abnormal   Collection Time: 09/07/17  8:57 AM  Result Value Ref Range   Color, Urine YELLOW YELLOW   APPearance CLOUDY (A) CLEAR   Specific Gravity, Urine 1.019 1.005 - 1.030   pH 5.0 5.0 - 8.0   Glucose, UA NEGATIVE NEGATIVE mg/dL   Hgb urine dipstick NEGATIVE NEGATIVE   Bilirubin Urine NEGATIVE NEGATIVE   Ketones, ur NEGATIVE NEGATIVE mg/dL   Protein,  ur 100 (A) NEGATIVE mg/dL   Nitrite NEGATIVE NEGATIVE   Leukocytes, UA NEGATIVE NEGATIVE   RBC / HPF 0-5 0 - 5 RBC/hpf   WBC, UA 0-5 0 - 5 WBC/hpf   Bacteria, UA RARE (A) NONE SEEN   Squamous Epithelial / LPF 6-10 0 - 5   Mucus PRESENT    Hyaline Casts, UA PRESENT     Comment: Performed at [facility], [address], [place], [place] [address]  Protein / creatinine ratio, urine     Status: Abnormal   Collection Time: 09/07/17  8:57 AM  Result Value Ref Range   Creatinine, Urine 207.84 mg/dL   Total Protein, Urine 74 mg/dL    Comment: NO NORMAL RANGE ESTABLISHED FOR THIS TEST   Protein Creatinine Ratio 0.36 (H) 0.00 - 0.15 mg/mg[Cre]    Comment: Performed at [facility] [address] [place], [place] [address]  Glucose, capillary     Status: Abnormal   Collection Time: 09/07/17 12:30 PM  Result Value Ref Range   Glucose-Capillary 158 (H) 70 - 99 mg/dL   Comment 1 Notify RN    Comment 2 Document in Chart   Glucose, capillary     Status: Abnormal   Collection Time: 09/07/17  5:47 PM  Result Value Ref Range   Glucose-Capillary 201 (H) 70 - 99 mg/dL   Comment 1 Notify RN    Comment 2 Document in Chart   Glucose, capillary     Status: Abnormal   Collection Time: 09/07/17  9:37 PM  Result Value Ref Range   Glucose-Capillary 179 (H) 70 - 99 mg/dL   Comment 1 Notify RN    Comment 2 Document in Chart   CBC     Status: Abnormal   Collection Time: 09/08/17  3:51 AM  Result Value Ref Range   WBC 5.5 4.0 - 10.5 K/uL   RBC 3.82 (L) 3.87 - 5.11 MIL/uL   Hemoglobin 8.7 (L) 12.0 - 15.0 g/dL   HCT 28.9 (L) 36.0 - 46.0 %   MCV 75.7 (L) 78.0 - 100.0 fL   MCH 22.8 (L) 26.0 - 34.0 pg   MCHC 30.1 30.0 - 36.0 g/dL   RDW 19.8 (H) 11.5 - 15.5 %   Platelets 160 150 - 400 K/uL    Comment: Performed at [facility], [address] [place],  [address]  Basic metabolic panel     Status: Abnormal   Collection Time: 09/08/17  3:51 AM  Result Value Ref Range   Sodium  136 135 - 145 mmol/L   Potassium 4.6 3.5 - 5.1 mmol/L   Chloride 104 98 - 111 mmol/L   CO2 23 22 - 32 mmol/L   Glucose, Bld 209 (H) 70 - 99 mg/dL   BUN 59 (H) 8 - 23 mg/dL   Creatinine, Ser 4.06 (H) 0.44 - 1.00 mg/dL   Calcium 7.1 (L) 8.9 - 10.3 mg/dL   GFR calc non Af Amer 10 (L) >60 mL/min   GFR  calc Af Amer 12 (L) >60 mL/min    Comment: (NOTE) The eGFR has been calculated using the CKD EPI equation. This calculation has not been validated in all clinical situations. eGFR's persistently <60 mL/min signify possible Chronic Kidney Disease.    Anion gap 9 5 - 15    Comment: Performed at [facility] [address] [place], [place] [address]  Ammonia     Status: None   Collection Time: 09/08/17  3:51 AM  Result Value Ref Range   Ammonia 30 9 - 35 umol/L    Comment: Performed at [facility], [place] [address], [place], [place] [address]  Glucose, capillary     Status: Abnormal   Collection Time: 09/08/17  8:42 AM  Result Value Ref Range   Glucose-Capillary 222 (H) 70 - 99 mg/dL   Comment 1 Notify RN    Comment 2 Document in Chart   Glucose, capillary     Status: Abnormal   Collection Time: 09/08/17 12:32 PM  Result Value Ref Range   Glucose-Capillary 323 (H) 70 - 99 mg/dL   Comment 1 Notify RN    Comment 2 Document in Chart     Ct Head Wo Contrast  Result Date: 09/08/2017 CLINICAL DATA:  Initial evaluation for acute altered mental status. EXAM: CT HEAD WITHOUT CONTRAST TECHNIQUE: Contiguous axial images were obtained from the base of the skull through the vertex without intravenous contrast. COMPARISON:  Previous MRI from 09/05/2017 FINDINGS: Brain: Atrophy with chronic small vessel ischemic disease. Small remote left cerebellar infarct. No acute intracranial hemorrhage. No acute large vessel territory infarct. No mass lesion, midline shift or mass effect. No hydrocephalus. No extra-axial fluid collection. Vascular: No hyperdense vessel. Scattered vascular  calcifications noted within the carotid siphons. Skull: Scalp soft tissues and calvarium within normal limits. Sinuses/Orbits: Globes and orbital soft tissues demonstrate no acute abnormality. Trace layering opacity within the left sphenoid sinus. Paranasal sinuses otherwise clear. No mastoid effusion. Other: None. IMPRESSION: 1. No acute intracranial abnormality. 2. Stable atrophy with chronic small vessel ischemic disease. Small remote left cerebellar infarct. Electronically Signed   By: Jeannine Boga M.D.   On: 09/08/2017 02:28   US Renal  Result Date: 09/07/2017 CLINICAL DATA:  Acute renal failure. EXAM: RENAL / URINARY TRACT ULTRASOUND COMPLETE COMPARISON:  Body CT 09/05/2017 FINDINGS: Right Kidney: Length: 10.2 cm. Echogenicity within normal limits. No solid mass or hydronephrosis visualized. Benign-appearing cyst in the midpole region of the right kidney measures 1.8 cm in diameter. Left Kidney: Length: 10.7 cm. Echogenicity within normal limits. No solid mass or hydronephrosis visualized. Benign-appearing cyst in the lower pole of the left kidney measures 3.4 cm in diameter. Bladder: Not seen. IMPRESSION: The exam was limited due to patient's body habitus. Bilateral benign-appearing renal cyst, otherwise normal appearance of the kidneys. The urinary bladder was not visualized. Electronically Signed   By: Fidela Salisbury M.D.   On: 09/07/2017 18:09    Review of Systems  Constitutional: Negative for chills and fever.  Respiratory: Positive for shortness of breath. Negative for cough.        Mild, Intermittent SOB  Cardiovascular: Negative for palpitations and orthopnea.  Gastrointestinal: Positive for nausea.  Genitourinary: Negative for dysuria, frequency and urgency.    Blood pressure (!) 143/51, pulse 70, temperature 98.6 F (37 C), temperature source Oral, resp. rate (!) 21, height 5' 4.5" (1.638 m), weight 119 kg, SpO2 97 %. Physical Exam  Constitutional: She appears  well-developed and well-nourished.  No distress.  Obese  Cardiovascular: Normal rate, regular rhythm, normal heart sounds and intact distal pulses.  Pulmonary/Chest: Effort normal and breath sounds normal. No respiratory distress.  Abdominal: Soft. Bowel sounds are normal. She exhibits no distension. There is no tenderness.  Musculoskeletal: She exhibits no deformity.  Trace Edema Tender to moderate palpation  Neurological: She is alert.  Oriented to person and place, but not year  Skin: Skin is warm and dry.   Assessment/Plan: 1. Acute on Chronic Renal Failure: Cr Trend 1.6>>2.4>>3.4>>4. Patient with CKD III (Baseline Cr 1.8) who developed acute renal failure this admission following 90 minute hypotensive episode (following Nitro administration) and exposure to CT Contrast. Suspect mix of Ischemic and toxic ATN 2/2 Hypotension, Contrast. Patient is now oliguric (200-250cc/d out x 3d) and is +4.6L this admission (Though I/O may be inaccurate). > UA showed proteinuria and rare bacteria; Ur Protein/Cr 0.36. > Renal ultrasound consistent with medical-renal disease and bilateral benign cysts > Na, K, Cl, Acid/Base Good > BUN 59, patient endorses some nausea, though unclear when this started; A&Ox2 ([place] unclear) - Currently no indication for Dialysis, will continue to monitor - Discontinue IVF as patient is now euvolemic and possibly slightly overloaded - Continue to hold Diuretics and Antihypertensives - AM Renal Function Panel  2. Hypertension: BP 308F-683A systolic. Antihypertensives discontinued due to ARF.  3. Anemia of CKD: Hgb 8.7 today (Baseline Hgb 10-11), suspect dilutional component given +4.6L - Check Fe, Ferritin, TIBC and consider ESA  4. Diabetes, Abdominal Pain, COPD, OSA, Soft Tissue Pain, ?TIA: Per Primary Team  Joann Gutierrez 09/08/2017, 1:29 PM   Patient seen and examined, agree with above note with above modifications.  72 year old black female with diabetes  mellitus, hypertension, heart failure with preserved ejection fraction and also stage III CKD followed by Dr. Posey Pronto at [facility].  She receives an ESA and creatinine baseline seems to be between 1.6 and 1.8.  She presented with abdominal/chest pain.  Creatinine was 1.6 upon arrival.  She then had a CT with contrast, followed by nitroglycerin which led to prolonged hypotension.  In addition, after that a dose of ARB was given.  As a result of these things she is developed acute on chronic renal failure and appears oliguric.  Creatinine is 4 today and she does endorse possibly some uremic symptoms.  There are no acute indications for dialysis.  I hope that we will see renal function plateau to improve.  I think her take is full so we will stop IV fluids.  Check iron stores and treat as needed-did receive ESA on 8/6 Corliss Parish, MD 09/08/2017

## 2017-09-08 NOTE — Progress Notes (Signed)
Received phone call from bedside RN with concerns of decline in mental status. On assessment pt, a/ox2, lethargic but responsive to verbal stimuli. Pupils equal and reactive, VSS.This is a change from previous neuro exam. Will require further work up.    Altered Mental Status - CT of the head ordered. Unremarkable from previous CT scans performed. According to husband, she has not slept well within the past 24 hours and this behavior is similar to how she is at home when tired.   -BMP, ammonia - q4hr neuro exams  Lovey Newcomer, NP [facility] 7p-7a [phone]

## 2017-09-09 ENCOUNTER — Inpatient Hospital Stay (HOSPITAL_COMMUNITY): Admission: RE | Admit: 2017-09-09 | Payer: Medicare Other | Source: Ambulatory Visit

## 2017-09-09 LAB — IRON AND TIBC
Iron: 108 ug/dL (ref 28–170)
SATURATION RATIOS: 58 % — AB (ref 10.4–31.8)
TIBC: 188 ug/dL — ABNORMAL LOW (ref 250–450)
UIBC: 80 ug/dL

## 2017-09-09 LAB — CBC
HEMATOCRIT: 29.9 % — AB (ref 36.0–46.0)
Hemoglobin: 8.8 g/dL — ABNORMAL LOW (ref 12.0–15.0)
MCH: 22.2 pg — ABNORMAL LOW (ref 26.0–34.0)
MCHC: 29.4 g/dL — AB (ref 30.0–36.0)
MCV: 75.5 fL — AB (ref 78.0–100.0)
Platelets: 169 10*3/uL (ref 150–400)
RBC: 3.96 MIL/uL (ref 3.87–5.11)
RDW: 20 % — AB (ref 11.5–15.5)
WBC: 6.8 10*3/uL (ref 4.0–10.5)

## 2017-09-09 LAB — FERRITIN: FERRITIN: 452 ng/mL — AB (ref 11–307)

## 2017-09-09 LAB — GLUCOSE, CAPILLARY
GLUCOSE-CAPILLARY: 209 mg/dL — AB (ref 70–99)
GLUCOSE-CAPILLARY: 291 mg/dL — AB (ref 70–99)
Glucose-Capillary: 251 mg/dL — ABNORMAL HIGH (ref 70–99)
Glucose-Capillary: 217 mg/dL — ABNORMAL HIGH (ref 70–99)
Glucose-Capillary: 228 mg/dL — ABNORMAL HIGH (ref 70–99)

## 2017-09-09 LAB — RENAL FUNCTION PANEL
Albumin: 2.4 g/dL — ABNORMAL LOW (ref 3.5–5.0)
Anion gap: 7 (ref 5–15)
BUN: 69 mg/dL — AB (ref 8–23)
CHLORIDE: 105 mmol/L (ref 98–111)
CO2: 20 mmol/L — ABNORMAL LOW (ref 22–32)
Calcium: 7.4 mg/dL — ABNORMAL LOW (ref 8.9–10.3)
Creatinine, Ser: 4.53 mg/dL — ABNORMAL HIGH (ref 0.44–1.00)
GFR calc Af Amer: 10 mL/min — ABNORMAL LOW (ref >60)
GFR, EST NON AFRICAN AMERICAN: 9 mL/min — AB (ref >60)
Glucose, Bld: 180 mg/dL — ABNORMAL HIGH (ref 70–99)
POTASSIUM: 5.6 mmol/L — AB (ref 3.5–5.1)
Phosphorus: 4.7 mg/dL — ABNORMAL HIGH (ref 2.5–4.6)
Sodium: 132 mmol/L — ABNORMAL LOW (ref 135–145)

## 2017-09-09 MED ORDER — ENOXAPARIN SODIUM 30 MG/0.3ML ~~LOC~~ SOLN
30.0000 mg | Freq: Every day | SUBCUTANEOUS | Status: DC
  Administered 2017-09-09 – 2017-09-12 (×4): 30 mg via SUBCUTANEOUS
  Filled 2017-09-09 (×4): qty 0.3

## 2017-09-09 MED ORDER — DARBEPOETIN ALFA 150 MCG/0.3ML IJ SOSY
150.0000 ug | PREFILLED_SYRINGE | Freq: Once | INTRAMUSCULAR | Status: AC
  Administered 2017-09-09: 150 ug via SUBCUTANEOUS
  Filled 2017-09-09: qty 0.3

## 2017-09-09 MED ORDER — FUROSEMIDE 10 MG/ML IJ SOLN
80.0000 mg | Freq: Once | INTRAMUSCULAR | Status: AC
  Administered 2017-09-09: 80 mg via INTRAVENOUS
  Filled 2017-09-09: qty 8

## 2017-09-09 MED ORDER — SODIUM POLYSTYRENE SULFONATE 15 GM/60ML PO SUSP
30.0000 g | Freq: Once | ORAL | Status: AC
  Administered 2017-09-09: 30 g via ORAL
  Filled 2017-09-09: qty 120

## 2017-09-09 MED ORDER — INSULIN DETEMIR 100 UNIT/ML ~~LOC~~ SOLN
18.0000 [IU] | Freq: Every day | SUBCUTANEOUS | Status: DC
  Administered 2017-09-09 – 2017-09-12 (×4): 18 [IU] via SUBCUTANEOUS
  Filled 2017-09-09 (×4): qty 0.18

## 2017-09-09 NOTE — Progress Notes (Addendum)
Subjective: Interval History: Patient feeling okay today. Endorses some continued shortness of breath and nausea, but feels okay. Cleaned her breakfast tray   Objective: Vital signs in last 24 hours: Temp:  [97.7 F (36.5 C)-98.8 F (37.1 C)] 98.4 F (36.9 C) (08/20 0750) Pulse Rate:  [62-73] 71 (08/20 0756) Resp:  [17-21] 21 (08/20 0756) BP: (106-143)/(47-81) 122/53 (08/20 0756) SpO2:  [97 %-100 %] 100 % (08/20 0756) Weight change:   Intake/Output from previous day: 08/19 0701 - 08/20 0700 In: 1652.5 [P.O.:840; I.V.:812.5] Out: 250 [Urine:250] Intake/Output this shift: No intake/output data recorded.  Physical Exam  Constitutional: She appears well-developed and well-nourished. No distress.  Cardiovascular: Normal rate, regular rhythm, normal heart sounds and intact distal pulses.  Pulmonary/Chest: Effort normal and breath sounds normal. No respiratory distress.  Abdominal: Soft. Bowel sounds are normal.  Musculoskeletal: She exhibits edema. She exhibits no deformity.  Neurological: She is alert.  Skin: Skin is warm and dry.    Lab Results: Recent Labs    09/08/17 0351 09/09/17 0251  WBC 5.5 6.8  HGB 8.7* 8.8*  HCT 28.9* 29.9*  PLT 160 169   BMET:  Recent Labs    09/08/17 0351 09/09/17 0251  NA 136 132*  K 4.6 5.6*  CL 104 105  CO2 23 20*  GLUCOSE 209* 180*  BUN 59* 69*  CREATININE 4.06* 4.53*  CALCIUM 7.1* 7.4*   No results for input(s): PTH in the last 72 hours. Iron Studies: No results for input(s): IRON, TIBC, TRANSFERRIN, FERRITIN in the last 72 hours. CBG (last 3)  Recent Labs    09/08/17 1232 09/08/17 1653 09/08/17 2133  GLUCAP 323* 120* 132*   Studies/Results: Ct Head Wo Contrast  Result Date: 09/08/2017 CLINICAL DATA:  Initial evaluation for acute altered mental status. EXAM: CT HEAD WITHOUT CONTRAST TECHNIQUE: Contiguous axial images were obtained from the base of the skull through the vertex without intravenous contrast. COMPARISON:   Previous MRI from 09/05/2017 FINDINGS: Brain: Atrophy with chronic small vessel ischemic disease. Small remote left cerebellar infarct. No acute intracranial hemorrhage. No acute large vessel territory infarct. No mass lesion, midline shift or mass effect. No hydrocephalus. No extra-axial fluid collection. Vascular: No hyperdense vessel. Scattered vascular calcifications noted within the carotid siphons. Skull: Scalp soft tissues and calvarium within normal limits. Sinuses/Orbits: Globes and orbital soft tissues demonstrate no acute abnormality. Trace layering opacity within the left sphenoid sinus. Paranasal sinuses otherwise clear. No mastoid effusion. Other: None. IMPRESSION: 1. No acute intracranial abnormality. 2. Stable atrophy with chronic small vessel ischemic disease. Small remote left cerebellar infarct. Electronically Signed   By: Jeannine Boga M.D.   On: 09/08/2017 02:28   US Renal  Result Date: 09/07/2017 CLINICAL DATA:  Acute renal failure. EXAM: RENAL / URINARY TRACT ULTRASOUND COMPLETE COMPARISON:  Body CT 09/05/2017 FINDINGS: Right Kidney: Length: 10.2 cm. Echogenicity within normal limits. No solid mass or hydronephrosis visualized. Benign-appearing cyst in the midpole region of the right kidney measures 1.8 cm in diameter. Left Kidney: Length: 10.7 cm. Echogenicity within normal limits. No solid mass or hydronephrosis visualized. Benign-appearing cyst in the lower pole of the left kidney measures 3.4 cm in diameter. Bladder: Not seen. IMPRESSION: The exam was limited due to patient's body habitus. Bilateral benign-appearing renal cyst, otherwise normal appearance of the kidneys. The urinary bladder was not visualized. Electronically Signed   By: Fidela Salisbury M.D.   On: 09/07/2017 18:09    I have reviewed the patient's current medications.  Assessment/Plan: 1.  Acute on Chronic Renal Failure: Cr Trend 3.4>>4>>4.5. Patient with CKD III (Baseline Cr 1.8) who developed acute  renal failure this admission following 90 minute hypotensive episode (following Nitro administration) and exposure to CT Contrast. Suspect mix of Ischemic and toxic ATN 2/2 Hypotension, Contrast. Patient is now oliguric (200-250cc/d out x 3d). > Renal ultrasound consistent with medical-renal disease and bilateral benign cysts > Na 132, K 5.6, Bicarb 20, Kayexalate ordered by primary > BUN 69, possibly some uremic symptoms (nausea, A&Ox2), will monitor - Dialysis not indicated at this time; will give 1 dose IV Lasix to help with vol status and K, will monitor response - Continue to hold Antihypertensives - Place purwick to quantify Ur output with lasix dose - AM Renal Function Panel  2. Hypertension: BP 157W systolic. Antihypertensives discontinued due to ARF. Will give 1 dose of Lasix as above.  3. Anemia of CKD: Hgb 8.8 today (Baseline Hgb 10-11), suspect dilutional component given +4.6L - Check Fe, Ferritin, TIBC (unable to be collected yesterday) - Has received ESA outpatient per [facility] records, due for this - Aranesp 127mcg every 2 weeks  4. Diabetes, Abdominal Pain, COPD, OSA, Soft Tissue Pain, ?TIA: Per Primary Team   LOS: 2 days   Neva Seat 09/09/2017,8:19 AM   Patient seen and examined, agree with above note with above modifications.   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  Last crt 1.8 and also on ESA.  Pt now with A on CRF in the setting of contrast and ATN with cont ARB administration.  Pt clinically unchanged today- crt still trending up and UOP not great.  Will give one lasix 80 mg dose today and try to quantify UOP with Purwick.  Giving ESA today for hgb in the 8's.  Hope to soon see plateau and improvement.  No indications for dialytic support   Corliss Parish, MD 09/09/2017

## 2017-09-09 NOTE — Clinical Social Work Note (Signed)
Clinical Social Work Assessment  Patient Details  Name: Joann Gutierrez MRN: [id number] Date of Birth: 1945/11/29  Date of referral:  09/09/17               Reason for consult:  Facility Placement, Discharge Planning                Permission sought to share information with:    Permission granted to share information::  No  Name::        Agency::     Relationship::     Contact Information:     Housing/Transportation Living arrangements for the past 2 months:  Single Family Home Source of Information:  Patient, Medical Team Patient Interpreter Needed:  None Criminal Activity/Legal Involvement Pertinent to Current Situation/Hospitalization:  No - Comment as needed Significant Relationships:  Friend, Adult Children Lives with:  Friends Do you feel safe going back to the place where you live?  No Need for family participation in patient care:  Yes (Comment)  Care giving concerns:  PT recommending SNF placement once medically stable for discharge.   Social Worker assessment / plan:  CSW met with patient. No support at discharge. CSW introduced role and explained that PT recommendations would be discussed. Patient prefers HHPT rather than SNF. She states she lives with a friend and he does everything for her. He is there 24/7. RNCM notified. CSW encouraged patient to work with PT as much as possible before discharging to ensure safety at home. No further concerns. CSW signing off as social work intervention is no longer needed.  Employment status:  Retired Forensic scientist:  Medicare PT Recommendations:  [place] / Referral to community resources:  [place]  Patient/Family's Response to care:  Patient prefers HHPT. Patient's friend and daughter supportive and involved in patient's care. Patient appreciated social work intervention.  Patient/Family's Understanding of and Emotional Response to Diagnosis, Current Treatment, and  Prognosis:  Patient has a good understanding of the reason for admission and her need for continued therapy after discharge. Patient appears happy with hospital care.  Emotional Assessment Appearance:  Appears stated age Attitude/Demeanor/Rapport:  Engaged, Gracious Affect (typically observed):  Appropriate, Calm, Pleasant Orientation:  Oriented to Self, Oriented to Place, Oriented to  Time, Oriented to Situation Alcohol / Substance use:  Never Used Psych involvement (Current and /or in the community):  No (Comment)  Discharge Needs  Concerns to be addressed:  Care Coordination Readmission within the last 30 days:  No Current discharge risk:  Dependent with Mobility Barriers to Discharge:  Continued Medical Work up   Candie Chroman, LCSW 09/09/2017, 1:02 PM

## 2017-09-09 NOTE — Progress Notes (Signed)
STROKE TEAM PROGRESS NOTE      SUBJECTIVE (INTERVAL HISTORY) No one is at the bedside.  . Echocardiogram results are unremarkable.   No new complaints.    OBJECTIVE       Vitals:   09/06/17 0000 09/06/17 0300 09/06/17 0400 09/06/17 0739  BP: 106/60  (!) 148/74 (!) 156/76  Pulse: 65 64 (!) 56 (!) 58  Resp: 17 19 16 15   Temp: 98.1 F (36.7 C)  97.6 F (36.4 C) 98.2 F (36.8 C)  TempSrc: Oral  Oral Oral  SpO2: 99% 100% 99% 97%  Weight:      Height:        CBC:  LastLabs      Recent Labs  Lab 09/04/17 2139 09/05/17 0339  WBC 7.5 8.2  NEUTROABS 4.3 4.6  HGB 10.4* 10.7*  HCT 34.8* 36.4  MCV 75.7* 74.9*  PLT 200 210      Basic Metabolic Panel:  LastLabs      Recent Labs  Lab 09/05/17 0339 09/06/17 0308  NA 143 143  K 4.0 4.0  CL 106 104  CO2 26 27  GLUCOSE 138* 84  BUN 32* 38*  CREATININE 1.61* 2.45*  CALCIUM 8.8* 8.7*      Lipid Panel:  Labs(Brief)          Component Value Date/Time   CHOL 238 (H) 09/06/2017 0308   TRIG 174 (H) 09/06/2017 0308   HDL 37 (L) 09/06/2017 0308   CHOLHDL 6.4 09/06/2017 0308   VLDL 35 09/06/2017 0308   LDLCALC 166 (H) 09/06/2017 0308     HgbA1c:  RecentLabs       Lab Results  Component Value Date   HGBA1C 11.5 (H) 09/06/2017     Urine Drug Screen:  Labs(Brief)  No results found for: LABOPIA, COCAINSCRNUR, LABBENZ, AMPHETMU, THCU, LABBARB    Alcohol Level  Labs(Brief)  No results found for: [facility]    IMAGING  Ct Abdomen Pelvis Wo Contrast 09/05/2017 IMPRESSION:  1. Small focus of consolidation within the basilar right lower lobe may represent atelectasis or pneumonia.  2. No acute process of the abdomen and pelvis identified.  3. Mild cardiomegaly.  4. Diverticulosis without findings of acute diverticulitis.  5. Aortic atherosclerosis.   Dg Chest 2 View 09/04/2017 IMPRESSION: Cardiomegaly with vascular congestion.   Mr Brain Wo  Contrast 09/05/2017 IMPRESSION:  1. No acute intracranial abnormality.  2. 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  3. Single focus of chronic microhemorrhage in the right brainstem may correspond to the area gliosis seen on the head CT.   Ct Abdomen Pelvis W Contrast 09/04/2017 IMPRESSION:  Diverticulosis without diverticulitis. Stable renal cysts. No acute abnormality noted.   Ct Head Code Stroke Wo Contrast 09/05/2017 IMPRESSION:  1. Questionable new hypodense lacune in the right pons, but may be artifact.  2. Otherwise stable chronic small vessel ischemia. No acute intracranial hemorrhage or cortically based infarct.  3. ASPECTS is 10.     Transthoracic Echocardiogram - Left ventricle: The cavity size was normal. There was moderate   concentric hypertrophy. Systolic function was vigorous. The   estimated ejection fraction was in the range of 65% to 70%. There   was dynamic obstruction at restin the mid cavity, with a peak   velocity of 110 cm/sec and a peak gradient of 5 mm Hg. Wall   motion was normal; there were no regional wall motion   abnormalities   Bilateral Carotid Dopplers 09/06/2017 Preliminary report:1-39% ICA plaquing. Vertebral  artery flow is antegrade.     PHYSICAL EXAM Blood pressure (!) 156/76, pulse (!) 58, temperature 98.2 F (36.8 C), temperature source Oral, resp. rate 15, height 5' 4.5" (1.638 m), weight 119 kg, SpO2 97 %.  Neurological Exam ;  Awake  Alert oriented x 3. Normal speech and language.eye movements full without nystagmus.fundi were not visualized. Vision acuity and fields appear normal. Hearing is normal. Palatal movements are normal. Face symmetric. Tongue midline. Normal strength, tone, reflexes and coordination. Normal sensation. Gait deferred.     ASSESSMENT/PLAN Joann Gutierrez is a 72 y.o. female with history of CVA, HTN, HLD, DM, seizures,obesity, CAD, COPD  presenting with  mild confusion, left facial droop, difficulty speaking. She did not receive IV t-PA due to established hypodensity on CT Head.  Probable TIA:   Resultant  no deficits  CT head - Questionable new hypodense lacune in the right pons, but may be artifact.   MRI head - No acute intracranial abnormality.  MRA head - not performed  Carotid Doppler - 1-39% ICA plaquing. Vertebral artery flow is antegrade.  2D Echo - unremarkable  LDL -166  HgbA1c - 11.5  VTE prophylaxis - Lovenox  Diet - Heart healthy with thin liquids  aspirin 81 mg daily prior to admission, now on aspirin 81 mg daily  Patient counseled to be compliant with her antithrombotic medications  Ongoing aggressive stroke risk factor management  Therapy recommendations:  SNF  Disposition:  Pending  Hypertension  Stable  Permissive hypertension (OK if < 220/120) but gradually normalize in 5-7 days  Long-term BP goal normotensive  Hyperlipidemia  Lipid lowering medication PTA: Lipitor 40 mg daily and Zetia 10mg  daily.  LDL 166, goal < 70  Current lipid lowering medication: Zetia 10 mg daily -> would resume Lipitor at 80 mg daily and add co-Q10 to reduce statin myalgias. If unable to tolerate may consider switching to the new PCS 9 inhibitor injections in the future  Continue statin at discharge  Diabetes  HgbA1c 11.5, goal < 7.0  Uncontrolled  Other Stroke Risk Factors  Advanced age  Former cigarette smoker - quit > 30 years ago  Obesity, Body mass index is 44.34 kg/m., recommend weight loss, diet and exercise as appropriate   Hx stroke/TIA  Coronary artery disease   Other Active Problems  Anemia   CKD  Possible pneumonia  PLAN   . Add Lipitor and coenzyme Q 10. Stroke team will follow. No family available for discussion.  Stroke team will sign off. Call for questions. Antony Contras, MD Medical Director [place] Pager: [phone]  09/09/2017 12:34 PM

## 2017-09-09 NOTE — Progress Notes (Signed)
Physical Therapy Treatment Patient Details Name: Joann Gutierrez MRN: [id number] DOB: 1945-07-29 Today's Date: 09/09/2017    History of Present Illness Pt is a 72 y/o female admitted secondary to chest pain and weakness. MRI of the brain was negative. Pt found to have toxic metabolic encephalopathy versus TIA. PMH including but not limited to CAD, CHF, COPD, CKD, CVA, DM, HLD, HTN, seizures, sickle cell trait.    PT Comments    Pt cont to present with flat affect, delayed process, generalized weakness and impaired balance. Pt unsafe to return home due to requiring assist for all mobility and ADLs. Acute PT to cont to follow to progress ambulation and functional independence.    Follow Up Recommendations  SNF     Equipment Recommendations  None recommended by PT    Recommendations for Other Services       Precautions / Restrictions Precautions Precautions: Fall Restrictions Weight Bearing Restrictions: No    Mobility  Bed Mobility Overal bed mobility: Needs Assistance Bed Mobility: Supine to Sit     Supine to sit: Mod assist     General bed mobility comments: increased time today, modA to scoot to EOB and trunk elevation, pt initated LE movement to EOB  Transfers Overall transfer level: Needs assistance Equipment used: Rolling walker (2 wheeled) Transfers: Sit to/from [facility] Sit to Stand: Mod assist Stand pivot transfers: Mod assist       General transfer comment: modA to power up with bed raised, pt min/modA to stand from [facility] due to being able to push up on the arm rests. pt able to take 4 steps to [facility] and then to chair  Ambulation/Gait             General Gait Details: unable to ambulate this date, fatigues quickly, able to complete 2 stand pvt transfers   Stairs             Wheelchair Mobility    Modified Rankin (Stroke Patients Only)       Balance Overall balance assessment: Needs assistance Sitting-balance  support: Feet supported;No upper extremity supported Sitting balance-[name]: Fair     Standing balance support: Bilateral upper extremity supported Standing balance-[name]: Poor Standing balance comment: Reliant on UE support                            Cognition Arousal/Alertness: Lethargic(awoke from nap, alertness improved with session) Behavior During Therapy: WFL for tasks assessed/performed Overall Cognitive Status: Impaired/Different from baseline Area of Impairment: Problem solving                             Problem Solving: Slow processing;Decreased initiation;Difficulty sequencing;Requires verbal cues;Requires tactile cues General Comments: Pt able to recall events from yesterday and was able to state she needed to use the [facility]      Exercises General Exercises - Lower Extremity Long Arc Quad: AROM;Both;10 reps;Seated    General Comments General comments (skin integrity, edema, etc.): VSS, pt dependent for hygiene s/p urination      Pertinent Vitals/Pain Pain Assessment: No/denies pain    Home Living                      Prior Function            PT Goals (current goals can now be found in the care plan section) Progress towards PT goals:  Progressing toward goals    Frequency    Min 2X/week      PT Plan Current plan remains appropriate    Co-evaluation              AM-PAC PT "6 Clicks" Daily Activity  Outcome Measure  Difficulty turning over in bed (including adjusting bedclothes, sheets and blankets)?: Unable Difficulty moving from lying on back to sitting on the side of the bed? : Unable Difficulty sitting down on and standing up from a chair with arms (e.g., wheelchair, bedside commode, etc,.)?: Unable Help needed moving to and from a bed to chair (including a wheelchair)?: A Lot Help needed walking in hospital room?: A Lot Help needed climbing 3-5 steps with a railing? : Total 6 Click Score: 8     End of Session Equipment Utilized During Treatment: Gait belt Activity Tolerance: Patient limited by fatigue Patient left: with call bell/phone within reach;Other (comment);in chair;with chair alarm set Nurse Communication: Mobility status PT Visit Diagnosis: Other abnormalities of gait and mobility (R26.89)     Time: 4142-3953 PT Time Calculation (min) (ACUTE ONLY): 23 min  Charges:  $Therapeutic Activity: 23-37 mins                     Kittie Plater, PT, DPT Pager #: [phone] Office #: [phone]    [place] 09/09/2017, 3:09 PM

## 2017-09-09 NOTE — Progress Notes (Signed)
PROGRESS NOTE    Joann Gutierrez  TWS:[id number] DOB: [birth year] DOA: 09/04/2017 PCP: Seward Carol, MD      Brief Narrative:  Joann Gutierrez is a 72 y.o. F with complex PMHx, COPD not on home O2, HTN, CKD III baseline Cr 1.8, dCHF, OSA, DM, chronic anemia, gastroparesis, and chronic widespread pain complaints who presented initially with complaints of "'knots' on her left neck, under left breast, and on left side of abdomen" for several weeks, then family added reports of "more confused than usually" and "mild odor" to urine, then to second ED provider reported abdominal pain "like there is a mass in her left upper quadrant and right lower quadrant.  Ongoing for 2 weeks".  As a result of her abdominal pain, a troponin was obtained that was 0.03 ng/mL, after which the patient was asked a third time if she had chest pain and this time said yes (had been previously documented as having no chest pain), and so TRH were asked to observe for cardiac rule out.  Shortly after admission to the hospital, the patient had an episode of hypotension and altered mental status.  CODE STROKE was called by nursing, and Neurology were consulted.        Assessment & Plan:  Acute renal failure with oliguria Not the presenting complaint, has developed since admission.    Baseline Cr 1.6, continues to worsen, to 4.5 today.  Only 250 urine output yesterday.  Renal ultrasound shows chronic renal disease no hydronephrosis.    Got contrast on day of admission.  Was hypertensive, taking ARB, Lasix, metolazone, then had transient 90 minute hypotensive episode two days ago (believed to be vagal episode).  Unfortunately also given dose of ARB, Lasix after creatinine started to rise.   (Of note, hard to know if she actually takes any of these at home despite med rec with RPh tech.  She endorsed taking Levemir three times per day to Diabetes coodinator, but has HgbA1c 12% almost).  Has low-grade macroalbuminemia, no  white blood cells or red blood cells on microscopy. -Hold ARB/losartan, diuretics Lasix, metolazone -BP still soft, hold antihypertensives -Trend BMP -Consult Nephrology, appreciate expert guidance  Hyperkalemia -Kayexalate x1 -Appreciate nephrology assistance re: timing of HD if necessary   Abdominal pain See below, the patient has widespread soft tissue pain.  Her LFTs are normal and CT abdomen on admission and then again yesterday in context of her hypotension were completely normal. The etiology of this is unclear, but no indications to suggest infection, intra-abdominal perforation, peritonitis or hemorrhage.  Hypotension, transient This occurred 8/16 in the following context: patient complained to nursing of chest pain, was given nitro x2 which did not relieve her pain, followed by which she had hypotension, became less responsive globally, and CODE STROKE was called.  Her hypotension resolved spontaneously after about 90 minutes without fluids or other intervention and in fact she became hypertensive again that night when her oral medications were held.  Elevated troponin ACS is not present.  Cardiology were consulted, they recommend echocardiogram.  Cardiology did not recommend an ischemic work-up, they have signed off.  Possible TIA, doubted Her episode of decreased responsiveness on the day of admission occurred in the context of hypotension, and appeared to me to be simple presyncope.  However, I agree that TIA is not possible to rule out, given nursing initial observation of left sided weakness. -MRI negative for stroke -Echocardiogram without cardiogenic source of clot -Carotid imaging unremarkable -Lipids ordered, LDL elevated, but  statin held with muscle pain --> if trial off statin does not affect muscle pains, should restart high intensity statin with Co-q10.  If muscle pains improve off statin, should consider [facility]-9 inhib per Neurology notes -Aspirin ordered   -Atrial  fibrillation: not present -tPA not given because symptoms resolved -Dysphagia screen ordered  -PT eval ordered  COPD  No symptoms -Continue home ICS/LABA, Flonase -Bronchodilators as needed -Continue Singulair and PPI  Hypertension BP soft again -Hold losartan, Lasix, metolazone -Hold doxazosin, carvedilol, hydralazine, Imdur -Labetalol as needed for blood pressure greater than 170 -Continue Zetia -Trial off statin (as above).     Chronic abdominal pain -Continue sucralfate  Diabetes Glucose 251 this AM -Continue Levemir, increase dose -Continue SSI   Anemia of chronic kidney disease Stable Hgb -Due for q2 week EPO today, will order Darbapoietin  Chest pain Widespread soft tissue pain This was the reason for admission, although not the chief presenting complaint (she had an incidentally noted detectable troponin, was subsequently asked if she has chest pain, which she does, chronically, as well as widespread soft tissue pain, I believe (she and husband are vague historians, but this is supported by previous admission histories/exams).   The differential for this would probably be fibromyalgia, statin-induced myopathy, and inflammatory myopathies.  Former seems unlikely to develop this stage in life I would think, and latter seem unlikely with normal CK.  TSH normal.  ANA  pending.  ESR minimal elevation.  Her chest pain is clearly reproducible on exam.  Cardiology believe this is chest wall pain from arthritis, I agree.    -Trial off statin, follow up with PCP  OSA -Continue CPAP      DVT prophylaxis: Enoxaparin Code Status: FULL Family Communication: Husband at bedside MDM and disposition Plan: The below labs and imaging reports were summarized above.  Medication management as above.  The patient initially presented for "skin knots", but then was found to have an unexplained troponin elevation, and chronic chest pain.  While in observation for chest pain and  elevated troponin, she developed hypotensive episode followed by acute renal failure.    Her renal function continues to worsen.  She now has hyperkalemia and slight acidosis.  Nephrology consulted, she is oliguric.        Consultants:   Cardiology  Neurology  Nephrology   Procedures:   CT head  MRI head  Carotid US  Echocardiogram  Renal ultrasound    Antimicrobials:   None    Subjective: She is oriented this morning.  No new dyspnea, swelling.  She has chronic malaise and nausea, no change.  She is not dyspneic.  She still has widespread diffuse pain.  No fever, cough, vomiting, chest pain.  Objective: Vitals:   09/09/17 0000 09/09/17 0400 09/09/17 0750 09/09/17 0756  BP: 106/81 (!) 114/52 (!) 122/53 (!) 122/53  Pulse: 62 68 73 71  Resp: '18 17 17 ' (!) 21  Temp: 98.8 F (37.1 C) 98.8 F (37.1 C) 98.4 F (36.9 C)   TempSrc: Oral Oral Axillary   SpO2: 100% 98% 100% 100%  Weight:      Height:        Intake/Output Summary (Last 24 hours) at 09/09/2017 0925 Last data filed at 09/09/2017 0700 Gross per 24 hour  Intake 1252.47 ml  Output 0 ml  Net 1252.47 ml   Filed Weights   09/04/17 1959 09/05/17 0228  Weight: 121.1 kg 119 kg    Examination: General appearance: Obese adult female, lying in  bed, no acute distress.  Interactive. HEENT: Anicteric, conjunctival pink, lids and lashes normal.  No nasal deformity, discharge, or epistaxis.  Lips moist, dentures in place.  Oropharynx moist, no tongue biting.  No oral lesions.  Hearing very diminished. Skin: Skin warm and dry, no suspicious rashes or lesions. Cardiac: RRR, no murmurs, JVP not visible, no lower extremity pitting edema. Respiratory: Respiratory rate is easy and effortless.  She has no wheezing.  She has some atelectasis that clears with a couple breaths. Abdomen: Tender anywhere that I palpate, with voluntary guarding.  No distention, rigidity, rebound.  No masses appreciated. MSK: No deformities  or effusions. Neuro:, Cranial nerves intact, extraocular movements intact, moves all extremities with global weakness, symmetrically.  Speech fluent.    Psych: Attention normal, affect blunted, judgment and insight appear impaired.  She is oriented to place, not to year.  Husband says this is normal in the morning for her.    Data Reviewed: I have personally reviewed following labs and imaging studies:  CBC: Recent Labs  Lab 09/04/17 2139 09/05/17 0339 09/07/17 0256 09/08/17 0351 09/09/17 0251  WBC 7.5 8.2 5.7 5.5 6.8  NEUTROABS 4.3 4.6  --   --   --   HGB 10.4* 10.7* 9.6* 8.7* 8.8*  HCT 34.8* 36.4 32.0* 28.9* 29.9*  MCV 75.7* 74.9* 74.8* 75.7* 75.5*  PLT 200 210 179 160 953   Basic Metabolic Panel: Recent Labs  Lab 09/05/17 0339 09/06/17 0308 09/07/17 0256 09/08/17 0351 09/09/17 0251  NA 143 143 138 136 132*  K 4.0 4.0 4.3 4.6 5.6*  CL 106 104 104 104 105  CO2 '26 27 26 23 ' 20*  GLUCOSE 138* 84 238* 209* 180*  BUN 32* 38* 48* 59* 69*  CREATININE 1.61* 2.45* 3.45* 4.06* 4.53*  CALCIUM 8.8* 8.7* 7.7* 7.1* 7.4*  PHOS  --   --   --   --  4.7*   GFR: Estimated Creatinine Clearance: 14.4 mL/min (A) (by C-G formula based on SCr of 4.53 mg/dL (H)). Liver Function Tests: Recent Labs  Lab 09/04/17 2139 09/05/17 0339 09/09/17 0251  AST 17 14*  --   ALT 9 8  --   ALKPHOS 82 85  --   BILITOT 0.6 0.9  --   PROT 7.2 7.0  --   ALBUMIN 3.3* 3.2* 2.4*   Recent Labs  Lab 09/04/17 2139  LIPASE 28   Recent Labs  Lab 09/05/17 1538 09/08/17 0351  AMMONIA 36* 30   Coagulation Profile: No results for input(s): INR, PROTIME in the last 168 hours. Cardiac Enzymes: Recent Labs  Lab 09/05/17 0339 09/05/17 0931 09/05/17 1538 09/05/17 2103 09/06/17 0308  CKTOTAL  --   --  68  --   --   TROPONINI <0.03 <0.03 <0.03 <0.03 <0.03   BNP (last 3 results) No results for input(s): PROBNP in the last 8760 hours. HbA1C: No results for input(s): HGBA1C in the last 72  hours. CBG: Recent Labs  Lab 09/08/17 0842 09/08/17 1232 09/08/17 1653 09/08/17 2133 09/09/17 0747  GLUCAP 222* 323* 120* 132* 251*   Lipid Profile: No results for input(s): CHOL, HDL, LDLCALC, TRIG, CHOLHDL, LDLDIRECT in the last 72 hours. Thyroid Function Tests: No results for input(s): TSH, T4TOTAL, FREET4, T3FREE, THYROIDAB in the last 72 hours. Anemia Panel: No results for input(s): VITAMINB12, FOLATE, FERRITIN, TIBC, IRON, RETICCTPCT in the last 72 hours. Urine analysis:    Component Value Date/Time   COLORURINE YELLOW 09/07/2017 0857   APPEARANCEUR CLOUDY (A)  09/07/2017 0857   LABSPEC 1.019 09/07/2017 0857   PHURINE 5.0 09/07/2017 0857   GLUCOSEU NEGATIVE 09/07/2017 0857   GLUCOSEU >=1000 01/02/2009 1101   HGBUR NEGATIVE 09/07/2017 0857   HGBUR negative 03/25/2008 [place] 09/07/2017 [place] 09/07/2017 0857   PROTEINUR 100 (A) 09/07/2017 0857   UROBILINOGEN 0.2 09/14/2013 1632   NITRITE NEGATIVE 09/07/2017 0857   LEUKOCYTESUR NEGATIVE 09/07/2017 0857   Sepsis Labs: '@LABRCNTIP' (procalcitonin:4,lacticacidven:4)  ) Recent Results (from the past 240 hour(s))  MRSA PCR Screening     Status: None   Collection Time: 09/05/17  2:42 AM  Result Value Ref Range Status   MRSA by PCR NEGATIVE NEGATIVE Final    Comment:        The GeneXpert MRSA Assay (FDA approved for NASAL specimens only), is one component of a comprehensive MRSA colonization surveillance program. It is not intended to diagnose MRSA infection nor to guide or monitor treatment for MRSA infections. Performed at [facility], [place] [address], [place],  [address]          Radiology Studies: Ct Head Wo Contrast  Result Date: 09/08/2017 CLINICAL DATA:  Initial evaluation for acute altered mental status. EXAM: CT HEAD WITHOUT CONTRAST TECHNIQUE: Contiguous axial images were obtained from the base of the skull through the vertex without intravenous  contrast. COMPARISON:  Previous MRI from 09/05/2017 FINDINGS: Brain: Atrophy with chronic small vessel ischemic disease. Small remote left cerebellar infarct. No acute intracranial hemorrhage. No acute large vessel territory infarct. No mass lesion, midline shift or mass effect. No hydrocephalus. No extra-axial fluid collection. Vascular: No hyperdense vessel. Scattered vascular calcifications noted within the carotid siphons. Skull: Scalp soft tissues and calvarium within normal limits. Sinuses/Orbits: Globes and orbital soft tissues demonstrate no acute abnormality. Trace layering opacity within the left sphenoid sinus. Paranasal sinuses otherwise clear. No mastoid effusion. Other: None. IMPRESSION: 1. No acute intracranial abnormality. 2. Stable atrophy with chronic small vessel ischemic disease. Small remote left cerebellar infarct. Electronically Signed   By: Jeannine Boga M.D.   On: 09/08/2017 02:28   US Renal  Result Date: 09/07/2017 CLINICAL DATA:  Acute renal failure. EXAM: RENAL / URINARY TRACT ULTRASOUND COMPLETE COMPARISON:  Body CT 09/05/2017 FINDINGS: Right Kidney: Length: 10.2 cm. Echogenicity within normal limits. No solid mass or hydronephrosis visualized. Benign-appearing cyst in the midpole region of the right kidney measures 1.8 cm in diameter. Left Kidney: Length: 10.7 cm. Echogenicity within normal limits. No solid mass or hydronephrosis visualized. Benign-appearing cyst in the lower pole of the left kidney measures 3.4 cm in diameter. Bladder: Not seen. IMPRESSION: The exam was limited due to patient's body habitus. Bilateral benign-appearing renal cyst, otherwise normal appearance of the kidneys. The urinary bladder was not visualized. Electronically Signed   By: Fidela Salisbury M.D.   On: 09/07/2017 18:09        Scheduled Meds: . aspirin EC  325 mg Oral Daily  . calcium carbonate  500 mg of elemental calcium Oral Q breakfast  . fluticasone  2 spray Each Nare Daily   . fluticasone furoate-vilanterol  1 puff Inhalation Daily  . gabapentin  300 mg Oral BID  . insulin aspart  0-15 Units Subcutaneous TID WC  . insulin aspart  0-5 Units Subcutaneous QHS  . insulin detemir  14 Units Subcutaneous Daily  . montelukast  10 mg Oral QHS  . pantoprazole  40 mg Oral Daily  . sodium polystyrene  30 g Oral  Once  . sucralfate  1 g Oral BID   Continuous Infusions:    LOS: 2 days    Time spent: 35 minutes    Edwin Dada, MD [facility] 09/09/2017, 9:25 AM     Pager [phone] --- please page though AMION:  [email or web address] If 7PM-7AM, please contact night-coverage

## 2017-09-10 DIAGNOSIS — I161 Hypertensive emergency: Secondary | ICD-10-CM

## 2017-09-10 LAB — RENAL FUNCTION PANEL
ANION GAP: 9 (ref 5–15)
Albumin: 2.5 g/dL — ABNORMAL LOW (ref 3.5–5.0)
BUN: 78 mg/dL — AB (ref 8–23)
CO2: 21 mmol/L — AB (ref 22–32)
Calcium: 7.5 mg/dL — ABNORMAL LOW (ref 8.9–10.3)
Chloride: 104 mmol/L (ref 98–111)
Creatinine, Ser: 4.09 mg/dL — ABNORMAL HIGH (ref 0.44–1.00)
GFR calc Af Amer: 12 mL/min — ABNORMAL LOW (ref >60)
GFR calc non Af Amer: 10 mL/min — ABNORMAL LOW (ref >60)
GLUCOSE: 163 mg/dL — AB (ref 70–99)
Phosphorus: 4.7 mg/dL — ABNORMAL HIGH (ref 2.5–4.6)
Potassium: 4.6 mmol/L (ref 3.5–5.1)
SODIUM: 134 mmol/L — AB (ref 135–145)

## 2017-09-10 LAB — GLUCOSE, CAPILLARY
GLUCOSE-CAPILLARY: 189 mg/dL — AB (ref 70–99)
GLUCOSE-CAPILLARY: 178 mg/dL — AB (ref 70–99)
Glucose-Capillary: 188 mg/dL — ABNORMAL HIGH (ref 70–99)
Glucose-Capillary: 169 mg/dL — ABNORMAL HIGH (ref 70–99)

## 2017-09-10 LAB — CBC
HCT: 29.6 % — ABNORMAL LOW (ref 36.0–46.0)
HEMOGLOBIN: 8.8 g/dL — AB (ref 12.0–15.0)
MCH: 22.4 pg — AB (ref 26.0–34.0)
MCHC: 29.7 g/dL — AB (ref 30.0–36.0)
MCV: 75.3 fL — ABNORMAL LOW (ref 78.0–100.0)
Platelets: 177 10*3/uL (ref 150–400)
RBC: 3.93 MIL/uL (ref 3.87–5.11)
RDW: 19.6 % — AB (ref 11.5–15.5)
WBC: 7.8 10*3/uL (ref 4.0–10.5)

## 2017-09-10 MED ORDER — GABAPENTIN 100 MG PO CAPS
100.0000 mg | ORAL_CAPSULE | Freq: Two times a day (BID) | ORAL | Status: DC
  Administered 2017-09-10 – 2017-09-12 (×4): 100 mg via ORAL
  Filled 2017-09-10 (×4): qty 1

## 2017-09-10 MED ORDER — INSULIN ASPART 100 UNIT/ML ~~LOC~~ SOLN
0.0000 [IU] | Freq: Three times a day (TID) | SUBCUTANEOUS | Status: DC
  Administered 2017-09-10 – 2017-09-11 (×2): 2 [IU] via SUBCUTANEOUS
  Administered 2017-09-11: 1 [IU] via SUBCUTANEOUS
  Administered 2017-09-11: 3 [IU] via SUBCUTANEOUS
  Administered 2017-09-12: 1 [IU] via SUBCUTANEOUS
  Administered 2017-09-12: 2 [IU] via SUBCUTANEOUS

## 2017-09-10 MED ORDER — FUROSEMIDE 80 MG PO TABS: 80.0000 mg | ORAL_TABLET | Freq: Two times a day (BID) | ORAL | Status: DC

## 2017-09-10 MED ORDER — INSULIN ASPART 100 UNIT/ML ~~LOC~~ SOLN
5.0000 [IU] | Freq: Three times a day (TID) | SUBCUTANEOUS | Status: DC
  Administered 2017-09-10 – 2017-09-12 (×7): 5 [IU] via SUBCUTANEOUS

## 2017-09-10 MED ORDER — FUROSEMIDE 80 MG PO TABS
80.0000 mg | ORAL_TABLET | Freq: Two times a day (BID) | ORAL | Status: DC
  Administered 2017-09-10 – 2017-09-12 (×5): 80 mg via ORAL
  Filled 2017-09-10 (×5): qty 1

## 2017-09-10 NOTE — Progress Notes (Signed)
Physical Therapy Treatment Patient Details Name: Joann Gutierrez MRN: [id number] DOB: 1945/12/12 Today's Date: 09/10/2017    History of Present Illness Pt is a 72 y/o female admitted secondary to chest pain and weakness. MRI of the brain was negative. Pt found to have toxic metabolic encephalopathy versus TIA. PMH including but not limited to CAD, CHF, COPD, CKD, CVA, DM, HLD, HTN, seizures, sickle cell trait.    PT Comments    Pt cont to remain very lethargic requiring max verbal cues to maintain eye opening, even sitting EOB. Pt did ambulate 61' with RW this date. Pt cont to require assistx2 for all mobility and ADLs at this time and is unsafe to return home. Pt to benefit from ST-SNF upon d/c for maximal functional return for safe transition home with significant other.   Follow Up Recommendations  SNF     Equipment Recommendations  None recommended by PT    Recommendations for Other Services       Precautions / Restrictions Precautions Precautions: Fall Restrictions Weight Bearing Restrictions: No    Mobility  Bed Mobility Overal bed mobility: Needs Assistance Bed Mobility: Supine to Sit     Supine to sit: Mod assist     General bed mobility comments: increased time today, modA to scoot to EOB and trunk elevation, pt initated LE movement to EOB  Transfers Overall transfer level: Needs assistance Equipment used: Rolling walker (2 wheeled) Transfers: Sit to/from [facility] Sit to Stand: Mod assist Stand pivot transfers: Mod assist       General transfer comment: modA to power up with bed raised, pt min/modA to stand from [facility] due to being able to push up on the arm rests. pt able to take 4 steps to [facility] and then to chair  Ambulation/Gait Ambulation/Gait assistance: Min assist;+2 physical assistance Gait Distance (Feet): 15 Feet Assistive device: Rolling walker (2 wheeled) Gait Pattern/deviations: Step-to pattern;Decreased stride length;Trunk  flexed;Shuffle Gait velocity: decreased   General Gait Details: pt pushing RW way out in front, minimal step height, onset of fatigue, minA for chair follow and walker managment   Stairs             Wheelchair Mobility    Modified Rankin (Stroke Patients Only)       Balance Overall balance assessment: Needs assistance Sitting-balance support: Feet supported;No upper extremity supported Sitting balance-[name]: Fair     Standing balance support: Bilateral upper extremity supported Standing balance-[name]: Poor Standing balance comment: Reliant on UE support                            Cognition Arousal/Alertness: Lethargic Behavior During Therapy: Flat affect Overall Cognitive Status: Impaired/Different from baseline Area of Impairment: Orientation;Problem solving                 Orientation Level: Disoriented to;Situation Current Attention Level: Sustained         Problem Solving: Slow processing;Decreased initiation;Difficulty sequencing;Requires verbal cues;Requires tactile cues General Comments: pt able to state hospital, town, state with verbal cues, pt remains extremely sleepy/lethargic requiring max verbal and tactile cues to stay awake, pt able to follow commands when awake      Exercises General Exercises - Lower Extremity Ankle Circles/Pumps: AROM;Both;10 reps;Seated Quad Sets: AROM;Both;10 reps;Seated(with LEs elevated) Gluteal Sets: AROM;Both;10 reps;Seated Long Arc Quad: AROM;Both;10 reps;Seated    General Comments General comments (skin integrity, edema, etc.): VSS, pt assisted to [facility] however pt unable  to void      Pertinent Vitals/Pain Pain Assessment: No/denies pain    Home Living                      Prior Function            PT Goals (current goals can now be found in the care plan section) Acute Rehab PT Goals Patient Stated Goal: didn't state Progress towards PT goals: Progressing toward  goals    Frequency    Min 2X/week      PT Plan Current plan remains appropriate    Co-evaluation              AM-PAC PT "6 Clicks" Daily Activity  Outcome Measure  Difficulty turning over in bed (including adjusting bedclothes, sheets and blankets)?: Unable Difficulty moving from lying on back to sitting on the side of the bed? : Unable Difficulty sitting down on and standing up from a chair with arms (e.g., wheelchair, bedside commode, etc,.)?: Unable Help needed moving to and from a bed to chair (including a wheelchair)?: A Lot Help needed walking in hospital room?: A Lot Help needed climbing 3-5 steps with a railing? : Total 6 Click Score: 8    End of Session Equipment Utilized During Treatment: Gait belt Activity Tolerance: Patient limited by lethargy;Patient limited by fatigue Patient left: in chair;with call bell/phone within reach Nurse Communication: Mobility status PT Visit Diagnosis: Other abnormalities of gait and mobility (R26.89)     Time: 6606-3016 PT Time Calculation (min) (ACUTE ONLY): 32 min  Charges:  $Gait Training: 8-22 mins $Therapeutic Activity: 8-22 mins                     Kittie Plater, PT, DPT Pager #: [phone] Office #: [phone]    [place] 09/10/2017, 3:04 PM

## 2017-09-10 NOTE — Progress Notes (Signed)
PROGRESS NOTE    Joann Gutierrez  ZCH:[id number] DOB: [birth year] DOA: 09/04/2017 PCP: Seward Carol, MD      Brief Narrative:  Joann Gutierrez is a 72 y.o. F with complex PMHx, COPD not on home O2, HTN, CKD III baseline Cr 1.8, dCHF, OSA, DM, chronic anemia, gastroparesis, and chronic widespread pain complaints who presented initially with complaints of "'knots' on her left neck, under left breast, and on left side of abdomen" for several weeks, then family added reports of "more confused than usually" and "mild odor" to urine, then to second ED provider reported abdominal pain "like there is a mass in her left upper quadrant and right lower quadrant.  Ongoing for 2 weeks".  As a result of her abdominal pain, a troponin was obtained that was 0.03 ng/mL, after which the patient was asked a third time if she had chest pain and this time said yes (had been previously documented as having no chest pain), and so TRH were asked to observe for cardiac rule out.  Shortly after admission to the hospital, the patient had an episode of hypotension and altered mental status.  CODE STROKE was called by nursing, and Neurology were consulted.        Assessment & Plan:  Acute renal failure with oliguria Not the presenting complaint, has developed since admission.    Baseline Cr 1.6, continues to worsen, to 4.5 today.  Only 250 urine output yesterday.  Renal ultrasound shows chronic renal disease no hydronephrosis.    Got contrast on day of admission.  Was hypertensive, taking ARB, Lasix, metolazone, then had transient 90 minute hypotensive episode two days ago (believed to be vagal episode).  Unfortunately also given dose of ARB, Lasix after creatinine started to rise.  Has low-grade macroalbuminemia, no white blood cells or red blood cells on microscopy. -Hold ARB/losartan, diuretics/Lasix, metolazone -Blood pressures still soft, hold antihypertensives -Trend BMP -Consult Nephrology, appreciate  expert guidance   Hyperkalemia Resolved.  Given Kayexalate 8/20 -Appreciate nephrology assistance re: timing of HD if necessary     Abdominal pain See below, the patient has widespread soft tissue pain.  Her LFTs are normal and CT abdomen on admission and then again yesterday in context of her hypotension were completely normal. The etiology of this is unclear, but no indications to suggest infection, intra-abdominal perforation, peritonitis or hemorrhage.  Elevated troponin ACS is not present.  Cardiology were consulted, they recommend echocardiogram.  Cardiology did not recommend an ischemic work-up, they have signed off.  Possible TIA, doubted Her episode of decreased responsiveness on the day of admission occurred in the context of hypotension, and appeared to me to be simple presyncope.  However, I agree that TIA is not possible to rule out, given nursing initial observation of left sided weakness, however, it is judged by me to be unlikely.  MRI negative for stroke. Echocardiogram without cardiogenic source of clot. Carotid imaging unremarkable.  -Aspirin 81 mg daily  Hyperlipidemia LDL here very elevated.  Was on statin at admission, purportedly.  In setting of her presenting complaint (widespread soft tissue pain), I think it is reasonable to trial off all statin therapy and monitor symptoms.   -If trial off statin does not affect muscle pains, should restart high intensity statin with Co-q10.   -If muscle pains improve off statin, should consider [facility]-9 inhib per Neurology notes  COPD  No symptoms -Continue home ICS/LABA, Flonase -Bronchodilators as needed -Continue Singulair, PPI  Hypertension BP soft again -Hold losartan, Lasix,  metolazone -Hold doxazosin, carvedilol, hydralazine, Imdur -Labetalol as needed for blood pressure greater than 170 -Continue Zetia -Trial off statin (as above).     Chronic abdominal pain -Continue sucralfate  Diabetes Elevated glucoses  still.  Titrating up cautiously. -Continue Levemir -Add mealtime insulin -Continue SSI, reduce to sensitiive -Reduce gabapentin dose in AKI   Anemia of chronic kidney disease Hgb no change.    Chest pain Widespread soft tissue pain This was the reason for admission, although not the chief presenting complaint (she had an incidentally noted detectable troponin, was subsequently asked if she has chest pain, which she does, chronically, as well as widespread soft tissue pain, I believe (she and husband are vague historians, but this is supported by previous admission histories/exams).   The differential for this would probably be fibromyalgia, statin-induced myopathy, and inflammatory myopathies.  Former seems unlikely to develop this stage in life I would think, and latter seem unlikely with normal CK.  TSH normal.  ANA  pending.  ESR minimal elevation.  Her chest pain is clearly reproducible on exam.  Cardiology believe this is chest wall pain from arthritis, I agree.    -Trial off statin, follow up with PCP  OSA -Continue CPAP         DVT prophylaxis: Enoxaparin, renal dosing safe in ESRD Code Status: FULL Family Communication: Husband at bedside MDM and disposition Plan: The below labs and imaging were reviewed and summarized above.  Medication mgmt as above.  The patient initially presented for "skin knots", but then was found to have an unexplained troponin elevation, and chronic chest pain.  While in observation for chest pain and elevated troponin, she developed hypotensive episode followed by acute renal failure due to hypotension and ARB administration.    Her renal function better today.  Nephrology following, likely to SNF if renal function normalizes.     Consultants:   Cardiology  Neurology  Nephrology   Procedures:   CT head  MRI head  Carotid US  Echocardiogram  Renal ultrasound    Antimicrobials:   None    Subjective: No dyspnea,  swelling, new chest pain, fever, cough, sputum production.  Objective: Vitals:   09/10/17 0408 09/10/17 0742 09/10/17 0750 09/10/17 1209  BP: (!) 135/55  (!) 134/51 (!) 147/66  Pulse: 78 88 86 92  Resp: (!) _0 Temp: 99.2 F (37.3 C)  99.1 F (37.3 C)   TempSrc: Axillary  Oral   SpO2: 100%  100% 92%  Weight:      Height:        Intake/Output Summary (Last 24 hours) at 09/10/2017 1246 Last data filed at 09/10/2017 0800 Gross per 24 hour  Intake 390 ml  Output 550 ml  Net -160 ml   Filed Weights   09/04/17 1959 09/05/17 0228  Weight: 121.1 kg 119 kg    Examination: General appearance: Obese adult female, lying in bed, no acute distress, arouses easily, interactive. HEENT: Anicteric, conjunctival pink, lids and lashes normal.  No nasal deformity, discharge, or epistaxis.  Lips moist, dentures in place.  Oropharynx tacky dry, no tongue biting, no oral lesions, hearing very diminished. Skin: Skin warm and dry, no suspicious rashes or lesions. Cardiac: RRR, no murmurs, no lower extremity edema. Respiratory: Normal respiratory rate and rhythm.  No wheezing, no rales. Abdomen: Abdomen diffusely tender with voluntary guarding.  No distention rigidity or rebound. MSK: No deformities or effusions. Neuro:, Cranial nerves intact, extraocular movements intact, moves all extremities with  global weakness, symmetrically.  Speech fluent. Psych: Attention normal, affect blunted, judgment and insight appear impaired.  Oriented to person, place, and time.    Data Reviewed: I have personally reviewed following labs and imaging studies:  CBC: Recent Labs  Lab 09/04/17 2139 09/05/17 0339 09/07/17 0256 09/08/17 0351 09/09/17 0251 09/10/17 0253  WBC 7.5 8.2 5.7 5.5 6.8 7.8  NEUTROABS 4.3 4.6  --   --   --   --   HGB 10.4* 10.7* 9.6* 8.7* 8.8* 8.8*  HCT 34.8* 36.4 32.0* 28.9* 29.9* 29.6*  MCV 75.7* 74.9* 74.8* 75.7* 75.5* 75.3*  PLT 200 210 179 160 169 681   Basic Metabolic  Panel: Recent Labs  Lab 09/06/17 0308 09/07/17 0256 09/08/17 0351 09/09/17 0251 09/10/17 0253  NA 143 138 136 132* 134*  K 4.0 4.3 4.6 5.6* 4.6  CL 104 104 104 105 104  CO2 _0 20* 21*  GLUCOSE 84 238* 209* 180* 163*  BUN 38* 48* 59* 69* 78*  CREATININE 2.45* 3.45* 4.06* 4.53* 4.09*  CALCIUM 8.7* 7.7* 7.1* 7.4* 7.5*  PHOS  --   --   --  4.7* 4.7*   GFR: Estimated Creatinine Clearance: 15.9 mL/min (A) (by C-G formula based on SCr of 4.09 mg/dL (H)). Liver Function Tests: Recent Labs  Lab 09/04/17 2139 09/05/17 0339 09/09/17 0251 09/10/17 0253  AST 17 14*  --   --   ALT 9 8  --   --   ALKPHOS 82 85  --   --   BILITOT 0.6 0.9  --   --   PROT 7.2 7.0  --   --   ALBUMIN 3.3* 3.2* 2.4* 2.5*   Recent Labs  Lab 09/04/17 2139  LIPASE 28   Recent Labs  Lab 09/05/17 1538 09/08/17 0351  AMMONIA 36* 30   Coagulation Profile: No results for input(s): INR, PROTIME in the last 168 hours. Cardiac Enzymes: Recent Labs  Lab 09/05/17 0339 09/05/17 0931 09/05/17 1538 09/05/17 2103 09/06/17 0308  CKTOTAL  --   --  68  --   --   TROPONINI <0.03 <0.03 <0.03 <0.03 <0.03   BNP (last 3 results) No results for input(s): PROBNP in the last 8760 hours. HbA1C: No results for input(s): HGBA1C in the last 72 hours. CBG: Recent Labs  Lab 09/09/17 0950 09/09/17 1232 09/09/17 1652 09/09/17 2137 09/10/17 0752  GLUCAP 217* 209* 228* 291* 188*   Lipid Profile: No results for input(s): CHOL, HDL, LDLCALC, TRIG, CHOLHDL, LDLDIRECT in the last 72 hours. Thyroid Function Tests: No results for input(s): TSH, T4TOTAL, FREET4, T3FREE, THYROIDAB in the last 72 hours. Anemia Panel: Recent Labs    09/09/17 1018  FERRITIN 452*  TIBC 188*  IRON 108   Urine analysis:    Component Value Date/Time   COLORURINE YELLOW 09/07/2017 0857   APPEARANCEUR CLOUDY (A) 09/07/2017 0857   LABSPEC 1.019 09/07/2017 0857   PHURINE 5.0 09/07/2017 0857   GLUCOSEU NEGATIVE 09/07/2017 0857    GLUCOSEU >=1000 01/02/2009 1101   HGBUR NEGATIVE 09/07/2017 0857   HGBUR negative 03/25/2008 1022   BILIRUBINUR NEGATIVE 09/07/2017 0857   KETONESUR NEGATIVE 09/07/2017 0857   PROTEINUR 100 (A) 09/07/2017 0857   UROBILINOGEN 0.2 09/14/2013 1632   NITRITE NEGATIVE 09/07/2017 0857   LEUKOCYTESUR NEGATIVE 09/07/2017 0857   Sepsis Labs: _1 (procalcitonin:4,lacticacidven:4)  ) Recent Results (from the past 240 hour(s))  MRSA PCR Screening     Status: None   Collection Time: 09/05/17  2:42 AM  Result Value Ref Range Status   MRSA by PCR NEGATIVE NEGATIVE Final    Comment:        The GeneXpert MRSA Assay (FDA approved for NASAL specimens only), is one component of a comprehensive MRSA colonization surveillance program. It is not intended to diagnose MRSA infection nor to guide or monitor treatment for MRSA infections. Performed at [facility], [place] [address], [place], [place] [address]          Radiology Studies: No results found.      Scheduled Meds: . aspirin EC  325 mg Oral Daily  . calcium carbonate  500 mg of elemental calcium Oral Q breakfast  . enoxaparin (LOVENOX) injection  30 mg Subcutaneous Daily  . fluticasone  2 spray Each Nare Daily  . fluticasone furoate-vilanterol  1 puff Inhalation Daily  . furosemide  80 mg Oral BID  . gabapentin  100 mg Oral BID  . insulin aspart  0-15 Units Subcutaneous TID WC  . insulin aspart  0-5 Units Subcutaneous QHS  . insulin detemir  18 Units Subcutaneous Daily  . montelukast  10 mg Oral QHS  . pantoprazole  40 mg Oral Daily  . sucralfate  1 g Oral BID   Continuous Infusions:    LOS: 3 days    Time spent: 25 minutes   Edwin Dada, MD [facility] 09/10/2017, 12:46 PM     Pager [phone] --- please page though AMION:  [email or web address] If 7PM-7AM, please contact night-coverage

## 2017-09-10 NOTE — Progress Notes (Signed)
Inpatient Diabetes Program Recommendations  AACE/ADA: New Consensus Statement on Inpatient Glycemic Control (2015)  Target Ranges:  Prepandial:   less than 140 mg/dL      Peak postprandial:   less than 180 mg/dL (1-2 hours)      Critically ill patients:  140 - 180 mg/dL   Lab Results  Component Value Date   GLUCAP 188 (H) 09/10/2017   HGBA1C 11.5 (H) 09/06/2017    Review of Glycemic Control Results for BLESSED, GIRDNER (MRN [id number]) as of 09/10/2017 10:13  Ref. Range 09/09/2017 12:32 09/09/2017 16:52 09/09/2017 21:37 09/10/2017 07:52  Glucose-Capillary Latest Ref Range: 70 - 99 mg/dL 209 (H) 228 (H) 291 (H) 188 (H)   Diabetes history: Type 2 DM Outpatient Diabetes medications: Levemir 15 units QD,  Current orders for Inpatient glycemic control: Novolog 0-15 units TID, Novolog 0-5 units QHS, Levemir 18 units QD  Inpatient Diabetes Program Recommendations:     MD- Please consider starting Novolog Meal [place]:  Novolog 5 units TID with meals   (Please add the following Hold Parameters: Hold if pt eats <50% of meal, Hold if pt NPO)  Please also consider starting Novolog SSI regimen for home- Would use the Sensitive hospital scale (0-9 units) TID AC   Thanks, Bronson Curb, MSN, RNC-OB Diabetes Coordinator [phone] (8a-5p)

## 2017-09-10 NOTE — Progress Notes (Addendum)
Subjective: Interval History: Patient feeling okay today. States her SOB and Abdominal pain are improving. Denies nausea this AM. No other complaints or questions. Given lasix- 550 of uop but only one shift recorded.  crt down !! BUN up some  Objective: Vital signs in last 24 hours: Temp:  [98.4 F (36.9 C)-99.2 F (37.3 C)] 99.2 F (37.3 C) (08/21 0408) Pulse Rate:  [68-88] 88 (08/21 0742) Resp:  [14-22] 14 (08/21 0742) BP: (122-156)/(50-58) 135/55 (08/21 0408) SpO2:  [97 %-100 %] 100 % (08/21 0408) Weight change:   Intake/Output from previous day: 08/20 0701 - 08/21 0700 In: 270 [P.O.:270] Out: 550 [Urine:550] Intake/Output this shift: No intake/output data recorded.  Physical Exam  Constitutional: She appears well-developed and well-nourished. No distress.  Cardiovascular: Normal rate, regular rhythm, normal heart sounds and intact distal pulses.  Pulmonary/Chest: Effort normal and breath sounds normal. No respiratory distress.  Abdominal: Soft. Bowel sounds are normal.  Musculoskeletal: She exhibits edema. She exhibits no deformity.  Neurological: She is alert.  Skin: Skin is warm and dry.   Lab Results: Recent Labs    09/09/17 0251 09/10/17 0253  WBC 6.8 7.8  HGB 8.8* 8.8*  HCT 29.9* 29.6*  PLT 169 177   BMET:  Recent Labs    09/09/17 0251 09/10/17 0253  NA 132* 134*  K 5.6* 4.6  CL 105 104  CO2 20* 21*  GLUCOSE 180* 163*  BUN 69* 78*  CREATININE 4.53* 4.09*  CALCIUM 7.4* 7.5*   No results for input(s): PTH in the last 72 hours. Iron Studies:  Recent Labs    09/09/17 1018  IRON 108  TIBC 188*  FERRITIN 452*   CBG (last 3)  Recent Labs    09/09/17 1232 09/09/17 1652 09/09/17 2137  GLUCAP 209* 228* 291*   Studies/Results: No results found.  I have reviewed the patient's current medications.  Assessment/Plan: 1. Acute on Chronic Renal Failure: Cr Trend 3.4>>4.0>>4.5>>4.0. Patient with CKD III (Baseline Cr 1.8) who developed A/C RF  following hypotensive episode and CT Contrast. Suspect mix of Ischemic and toxic ATN. Responded to IV lasix dose with Ur output increased to 550. Renal function appears to be improving.  > Renal U/S: Bilateral benign cysts, otherwise normal > BUN 78, but nausea has improved and is A&Ox3 today > Cr appears to have plateued and begun downtrending - No Dialysis indicated at this time, appears renal function is beginning to recover - Resumed home Lasix dose of 80mg  PO BID as she responded to IV yesterday and remains volume up - Continue to hold other Antihypertensives - AM Renal Function Panel  2. Hypertension: BP 244W-102V systolic. Antihypertensives discontinued due to ARF.  - Restarted home Lasix  3. Anemia of CKD: Hgb stable at 8.8 today. Receives ESA outpatient - Fe, Ferritin Good, TIBC low - Aranesp 135mcg every 2 weeks, last dose 8/20  4. Diabetes, Abdominal Pain, COPD, OSA, Soft Tissue Pain, ?TIA: Per Primary Team   LOS: 3 days   Neva Seat 09/10/2017,7:45 AM   Patient seen and examined, agree with above note with above modifications.   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  Last crt 1.8 and also on ESA.  Pt now with A on CRF in the setting of contrast and iatrigenic ATN with cont ARB administration.  Pt clinically unchanged today- crt  trending down -UOP did improve with lasix but unclear exactly how much and still up.  Will resume lasix 80 mg PO daily  today.   Giving ESA today for hgb in the 8's.  Hopefully seeing plateau and improvement.  No indications for dialytic support at this time    Corliss Parish, MD

## 2017-09-10 NOTE — Progress Notes (Signed)
Pt placed on CPAP for night time rest tolerating well no issues to report

## 2017-09-11 DIAGNOSIS — N179 Acute kidney failure, unspecified: Secondary | ICD-10-CM

## 2017-09-11 DIAGNOSIS — I10 Essential (primary) hypertension: Secondary | ICD-10-CM

## 2017-09-11 DIAGNOSIS — N189 Chronic kidney disease, unspecified: Secondary | ICD-10-CM

## 2017-09-11 LAB — RENAL FUNCTION PANEL
ALBUMIN: 2.4 g/dL — AB (ref 3.5–5.0)
ANION GAP: 8 (ref 5–15)
BUN: 82 mg/dL — AB (ref 8–23)
CHLORIDE: 103 mmol/L (ref 98–111)
CO2: 26 mmol/L (ref 22–32)
Calcium: 8.2 mg/dL — ABNORMAL LOW (ref 8.9–10.3)
Creatinine, Ser: 3.66 mg/dL — ABNORMAL HIGH (ref 0.44–1.00)
GFR, EST AFRICAN AMERICAN: 13 mL/min — AB (ref >60)
GFR, EST NON AFRICAN AMERICAN: 11 mL/min — AB (ref >60)
Glucose, Bld: 147 mg/dL — ABNORMAL HIGH (ref 70–99)
PHOSPHORUS: 4.7 mg/dL — AB (ref 2.5–4.6)
POTASSIUM: 4.4 mmol/L (ref 3.5–5.1)
Sodium: 137 mmol/L (ref 135–145)

## 2017-09-11 LAB — CBC
HEMATOCRIT: 29.7 % — AB (ref 36.0–46.0)
HEMOGLOBIN: 8.8 g/dL — AB (ref 12.0–15.0)
MCH: 22.2 pg — ABNORMAL LOW (ref 26.0–34.0)
MCHC: 29.6 g/dL — ABNORMAL LOW (ref 30.0–36.0)
MCV: 75 fL — ABNORMAL LOW (ref 78.0–100.0)
Platelets: 192 10*3/uL (ref 150–400)
RBC: 3.96 MIL/uL (ref 3.87–5.11)
RDW: 19.9 % — ABNORMAL HIGH (ref 11.5–15.5)
WBC: 7.7 10*3/uL (ref 4.0–10.5)

## 2017-09-11 LAB — GLUCOSE, CAPILLARY
GLUCOSE-CAPILLARY: 230 mg/dL — AB (ref 70–99)
GLUCOSE-CAPILLARY: 160 mg/dL — AB (ref 70–99)
GLUCOSE-CAPILLARY: 124 mg/dL — AB (ref 70–99)
Glucose-Capillary: 90 mg/dL (ref 70–99)

## 2017-09-11 NOTE — Care Management Important Message (Signed)
Important Message  Patient Details  Name: Joann Gutierrez MRN: [id number] Date of Birth: 04-02-1945   Medicare Important Message Given:  Yes    Barb Merino Rossie 09/11/2017, 2:45 PM

## 2017-09-11 NOTE — Progress Notes (Addendum)
Subjective: Interval History: Patient feeling good today. States her SOB is improved and she was off oxygen when seen. Denies nausea this AM. No other complaints or questions. Uop of 1.9L last 24hrs. Cr improving.  Objective: Vital signs in last 24 hours: Temp:  [98.8 F (37.1 C)-99.1 F (37.3 C)] 99.1 F (37.3 C) (08/22 0730) Pulse Rate:  [79-92] 81 (08/22 0735) Resp:  [16-18] 18 (08/22 0735) BP: (147-155)/(53-66) 147/59 (08/22 0730) SpO2:  [92 %-97 %] 97 % (08/22 0735) Weight change:   Intake/Output from previous day: 08/21 0701 - 08/22 0700 In: 480 [P.O.:480] Out: 1900 [Urine:1900] Intake/Output this shift: No intake/output data recorded.  Physical Exam  Constitutional: She appears well-developed and well-nourished. No distress.  Cardiovascular: Normal rate, regular rhythm, normal heart sounds and intact distal pulses.  Pulmonary/Chest: Effort normal and breath sounds normal. No respiratory distress.  Abdominal: Soft. Bowel sounds are normal.  Musculoskeletal: She exhibits edema. She exhibits no deformity.  Neurological: She is alert.  Skin: Skin is warm and dry.    Lab Results: Recent Labs    09/10/17 0253 09/11/17 0218  WBC 7.8 7.7  HGB 8.8* 8.8*  HCT 29.6* 29.7*  PLT 177 192   BMET:  Recent Labs    09/10/17 0253 09/11/17 0218  NA 134* 137  K 4.6 4.4  CL 104 103  CO2 21* 26  GLUCOSE 163* 147*  BUN 78* 82*  CREATININE 4.09* 3.66*  CALCIUM 7.5* 8.2*   No results for input(s): PTH in the last 72 hours. Iron Studies:  Recent Labs    09/09/17 1018  IRON 108  TIBC 188*  FERRITIN 452*   CBG (last 3)  Recent Labs    09/10/17 1729 09/10/17 2127 09/11/17 0752  GLUCAP 178* 169* 230*   Studies/Results: No results found.  I have reviewed the patient's current medications.  Assessment/Plan: 1. Acute on Chronic Renal Failure: Cr Trend 3.4>>4.0>>4.5>>4.0. Patient with CKD III (Baseline Cr 1.8) who developed A/C RF following hypotensive episode and  CT Contrast. Suspect mix of Ischemic and toxic ATN. Responding to home lasix. UOP increased to at least 1.9L last 24 hrs. Renal function improving, Cr 3.6 today. > Renal U/S: Bilateral benign cysts, otherwise normal > BUN 88, but denies nausea and remains A&Ox3 > Cr appears to have plateued and begun downtrending - No indication for dialytic support, renal function is beginning to recover - Continue home Lasix dose of 80mg  PO BID, responding well - AM Renal Function Panel  2. Hypertension: BP 277A-128N systolic. Antihypertensives discontinued due to ARF.  - On home Lasix as above  3. Anemia of CKD: Hgb stable at 8.8 today. Receives ESA outpatient - Fe, Ferritin Good, TIBC low - Aranesp 125mcg every 2 weeks, last dose 8/20  4. Diabetes, Abdominal Pain, COPD, OSA, Soft Tissue Pain, ?TIA: Per Primary Team   LOS: 4 days   Neva Seat 09/11/2017,8:25 AM   Patient seen and examined, agree with above note with above modifications. Pt doing well- making good urine and creatinine coming down.  Nothing different to do today- hopefully this trend will continue  Corliss Parish, MD 09/11/2017

## 2017-09-11 NOTE — NC FL2 (Signed)
[place] MEDICAID FL2 LEVEL OF CARE SCREENING TOOL     IDENTIFICATION  Patient Name: Joann Gutierrez Birthdate: [birth year] Sex: female Admission Date (Current Location): 09/04/2017  [facility] and [place] Number:  Herbalist and Address:  [facility], [place] [address] [place], [place] [address]      Provider Number: [id number]  Attending Physician Name and Address:  Modena Jansky, MD  Relative Name and Phone Number:       Current Level of Care: Hospital Recommended Level of Care: [place] Prior Approval Number:    Date Approved/Denied:   PASRR Number: [id number]  Discharge Plan: SNF    Current Diagnoses: Patient Active Problem List   Diagnosis Date Noted  . ARF (acute renal failure) ([place]) 09/07/2017  . Chest pain 09/05/2017  . Ineffective health maintenance 06/26/2017  . Medication management 06/26/2017  . Retinal edema 10/07/2016  . Postherpetic neuralgia 12/21/2015  . Herpes zoster with complication 69/67/8938  . Cellulitis of face 11/27/2015  . Anemia of chronic disease 08/17/2015  . History of colonic polyps   . Hypertensive emergency 05/20/2015  . Hypokalemia 05/20/2015  . AKI (acute kidney injury) ([place])   . Esophageal dysmotility 03/01/2015  . Throat congestion 10/11/2014  . Morbid obesity due to excess calories ([place]) 09/23/2014  . Acute asthmatic bronchitis 05/20/2014  . Abdominal pain, other specified site 09/13/2013  . Elevated liver enzymes 09/13/2013  . Cough 05/05/2013  . Pain in lower limb 03/05/2013  . Dyspnea 02/03/2013  . Chronic respiratory failure ([place]) 12/13/2012  . Stricture and stenosis of esophagus 08/31/2012  . Onychomycosis 06/01/2012  . Pain in joint, ankle and foot 06/01/2012  . Insulin dependent diabetes mellitus ([place]) 04/29/2012  . Essential hypertension 07/19/2011  . CKD (chronic kidney disease), stage III ([place]) 05/19/2011  . Family history of malignant neoplasm of  gastrointestinal tract 10/30/2010  . Bloating 10/16/2010  . Epigastric pain 10/16/2010  . Foot pain 09/25/2010  . Dysphagia 07/16/2010  . GERD (gastroesophageal reflux disease) 07/16/2010  . Microcytic anemia 07/16/2010  . Obstructive sleep apnea 06/16/2009  . HYPERCHOLESTEROLEMIA 02/16/2009  . 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 Gastroparesis 08/21/2007  . Chronic obstructive asthma ([place]) 08/09/2006    Orientation RESPIRATION BLADDER Height & Weight     Self, Situation, Place(Per RN, doesn't know date)  Normal, Other (Comment)(Cpap at night) Continent, External catheter Weight: 262 lb 5.6 oz (119 kg) Height:  5' 4.5" (163.8 cm)  BEHAVIORAL SYMPTOMS/MOOD NEUROLOGICAL BOWEL NUTRITION STATUS  (None) (None) Continent Diet(Heart healthy)  AMBULATORY STATUS COMMUNICATION OF NEEDS Skin   Limited Assist Verbally Normal                       Personal Care Assistance Level of Assistance  Bathing, Feeding, Dressing Bathing Assistance: Maximum assistance Feeding assistance: Limited assistance Dressing Assistance: Maximum assistance     Functional Limitations Info  Sight, Hearing, Speech Sight Info: Adequate Hearing Info: Adequate Speech Info: Adequate    SPECIAL CARE FACTORS FREQUENCY  PT (By licensed PT), Blood pressure, OT (By licensed OT)     PT Frequency: 5 x week OT Frequency: 5 x week            Contractures Contractures Info: Not present    Additional Factors Info  Code Status, Allergies Code Status Info: Full Allergies Info: Morphine and related, Promethazine Hcl           Current Medications (09/11/2017):  This is the  current hospital active medication list Current Facility-Administered Medications  Medication Dose Route Frequency Provider Last Rate Last Dose  . acetaminophen (TYLENOL) tablet 650 mg  650 mg Oral Q6H PRN Rise Patience, MD   650 mg at 09/07/17 1009   Or  . acetaminophen (TYLENOL) suppository 650 mg  650 mg Rectal Q6H PRN  Rise Patience, MD   650 mg at 09/05/17 2002  . albuterol (PROVENTIL) (2.5 MG/3ML) 0.083% nebulizer solution 3 mL  3 mL Inhalation Q6H PRN Rise Patience, MD      . aspirin EC tablet 325 mg  325 mg Oral Daily Burnetta Sabin L, NP   325 mg at 09/11/17 1044  . calcium carbonate (OS-CAL - dosed in mg of elemental calcium) tablet 500 mg of elemental calcium  500 mg of elemental calcium Oral Q breakfast Rise Patience, MD   500 mg of elemental calcium at 09/11/17 0809  . enoxaparin (LOVENOX) injection 30 mg  30 mg Subcutaneous Daily Danford, Suann Larry, MD   30 mg at 09/11/17 1047  . fluticasone (FLONASE) 50 MCG/ACT nasal spray 2 spray  2 spray Each Nare Daily Rise Patience, MD   2 spray at 09/11/17 1049  . fluticasone furoate-vilanterol (BREO ELLIPTA) 100-25 MCG/INH 1 puff  1 puff Inhalation Daily Rise Patience, MD   1 puff at 09/11/17 0735  . furosemide (LASIX) tablet 80 mg  80 mg Oral BID Neva Seat, MD   80 mg at 09/11/17 0810  . gabapentin (NEURONTIN) capsule 100 mg  100 mg Oral BID Edwin Dada, MD   100 mg at 09/11/17 1044  . guaiFENesin (MUCINEX) 12 hr tablet 1,200 mg  1,200 mg Oral BID PRN Rise Patience, MD      . insulin aspart (novoLOG) injection 0-5 Units  0-5 Units Subcutaneous QHS Edwin Dada, MD   3 Units at 09/09/17 2214  . insulin aspart (novoLOG) injection 0-9 Units  0-9 Units Subcutaneous TID WC Edwin Dada, MD   3 Units at 09/11/17 [phone]  . insulin aspart (novoLOG) injection 5 Units  5 Units Subcutaneous TID WC Edwin Dada, MD   5 Units at 09/11/17 [phone]  . insulin detemir (LEVEMIR) injection 18 Units  18 Units Subcutaneous Daily Edwin Dada, MD   18 Units at 09/11/17 1046  . labetalol (NORMODYNE,TRANDATE) injection 10 mg  10 mg Intravenous Q6H PRN Danford, Suann Larry, MD      . montelukast (SINGULAIR) tablet 10 mg  10 mg Oral QHS Rise Patience, MD   10 mg at 09/10/17 2147  .  nitroGLYCERIN (NITROSTAT) SL tablet 0.4 mg  0.4 mg Sublingual Q5 min PRN Rise Patience, MD   0.4 mg at 09/05/17 1202  . ondansetron (ZOFRAN) tablet 4 mg  4 mg Oral Q6H PRN Rise Patience, MD       Or  . ondansetron [facility]) injection 4 mg  4 mg Intravenous Q6H PRN Rise Patience, MD      . pantoprazole (PROTONIX) EC tablet 40 mg  40 mg Oral Daily Rise Patience, MD   40 mg at 09/11/17 1044  . sucralfate (CARAFATE) tablet 1 g  1 g Oral BID Rise Patience, MD   1 g at 09/11/17 1044     Discharge Medications: Please see discharge summary for a list of discharge medications.  Relevant Imaging Results:  Relevant Lab Results:   Additional Information SS#: [id number]  Candie Chroman, LCSW

## 2017-09-11 NOTE — Progress Notes (Addendum)
PROGRESS NOTE    [name]  ZOX:[id number] DOB: 1945/12/27 DOA: 09/04/2017 PCP: Seward Carol, MD      Brief Narrative:  [name] is a 72 y.o. F with complex PMHx, COPD not on home O2, HTN, CKD III baseline Cr 1.8, dCHF, OSA, DM, chronic anemia, gastroparesis, and chronic widespread pain complaints who presented initially with complaints of "'knots' on her left neck, under left breast, and on left side of abdomen" for several weeks, then family added reports of "more confused than usually" and "mild odor" to urine, then to second ED provider reported abdominal pain "like there is a mass in her left upper quadrant and right lower quadrant.  Ongoing for 2 weeks".  As a result of her abdominal pain, a troponin was obtained that was 0.03 ng/mL, after which the patient was asked a third time if she had chest pain and this time said yes (had been previously documented as having no chest pain), and so TRH were asked to observe for cardiac rule out.  Shortly after admission to the hospital, the patient had an episode of hypotension and altered mental status.  CODE STROKE was called by nursing, and Neurology were consulted.   Assessment & Plan:  Acute on chronic renal failure with oliguria Not the presenting complaint, developed this since admission.    Baseline Cr 1.8, continued to worsen and peaked at 4.5 on 8/20.   Renal ultrasound shows chronic renal disease no hydronephrosis.    Got contrast on day of admission.  Was hypertensive, taking ARB, Lasix, metolazone, then had transient 90 minute hypotensive episode (believed to be vagal episode).  Unfortunately also given dose of ARB, Lasix after creatinine started to rise.  Has low-grade macroalbuminemia, no white blood cells or red blood cells on microscopy. -Hold ARB/losartan, diuretics/Lasix, metolazone -Blood pressures still soft, hold antihypertensives -Trend BMP -Nephrology consultation and follow-up appreciated.  Suspect  etiology of acute renal failure to be a mix of ischemic and toxic ATN related to hypotensive episode and CT contrast.  Home dose of Lasix was resumed at 80 mg twice daily.  Urine output has picked up (1900 yesterday) and creatinine trending down, 3.66 today.  Hopefully she is on continued back to recovery.  Follow BMP in a.m.  No indication for dialysis at this time.   Hyperkalemia Status post Kayexalate 8/20.  Resolved.  Secondary to acute on chronic kidney disease.  Abdominal pain See below, the patient has widespread soft tissue pain.  Her LFTs are normal and CT abdomen on admission and then again 8/16 in context of her hypotension were completely normal. The etiology of this is unclear, but no indications to suggest infection, intra-abdominal perforation, peritonitis or hemorrhage. Resolved.  Having BMs.  Elevated troponin ACS is not present.  Cardiology were consulted, they recommend echocardiogram.  Cardiology did not recommend an ischemic work-up, they have signed off. TTE 09/08/2017: LVEF 65-70% and grade 1 diastolic dysfunction.  Possible TIA, doubted Her episode of decreased responsiveness on the day of admission occurred in the context of hypotension, and appeared to me to be simple presyncope.  However, it was felt that TIA was not possible to rule out, given nursing initial observation of left sided weakness, however, it is judged by Dr. Loleta Books to be unlikely.  MRI negative for stroke. Echocardiogram without cardiogenic source of clot. Carotid imaging unremarkable.  -Aspirin 81 mg daily  Hyperlipidemia LDL here very elevated.  Was on statin at admission, purportedly.  In setting of her presenting  complaint (widespread soft tissue pain), I think it is reasonable to trial off all statin therapy and monitor symptoms.   -If trial off statin does not affect muscle pains, should restart high intensity statin with Co-q10.   -If muscle pains improve off statin, should consider PCSK-9 inhib  per Neurology notes - This can be followed up as Op by her PCP. No muscle pains reported today.  COPD  No symptoms -Continue home ICS/LABA, Flonase -Bronchodilators as needed -Continue Singulair, PPI  Hypertension Normotensive off of antihypertensives which were held after episode of hypotension and acute kidney injury except Lasix which has been resumed.  Consider resuming may be carvedilol in a.m. -Hold losartan, Lasix, metolazone -Hold doxazosin, carvedilol, hydralazine, Imdur -Labetalol as needed for blood pressure greater than 170 -Continue Zetia -Trial off statin (as above).     Chronic abdominal pain -Continue sucralfate.  Did not report abdominal pain today.  Diabetes Elevated glucoses still.  Titrating up cautiously. -Continue Levemir -Add mealtime insulin -Continue SSI, reduce to sensitiive -Reduce gabapentin dose in AKI -Mildly uncontrolled and fluctuating but continue current regimen without change.   Anemia of chronic kidney disease Hemoglobin stable in the 8.8 g range.  Chest pain Widespread soft tissue pain This was the reason for admission, although not the chief presenting complaint (she had an incidentally noted detectable troponin, was subsequently asked if she has chest pain, which she does, chronically, as well as widespread soft tissue pain (she and husband are vague historians, but this is supported by previous admission histories/exams).   The differential for this would probably be fibromyalgia, statin-induced myopathy, and inflammatory myopathies.  Former seems unlikely to develop this stage in life, and latter seem unlikely with normal CK.  TSH normal.  ANA  Neg.  ESR minimal elevation.  Her chest pain was clearly reproducible on exam.  Cardiology believe this is chest wall pain from arthritis, I agree.    -Trial off statin, follow up with PCP  OSA -Continue CPAP  Morbid obesity/Body mass index is 44.34 kg/m.    DVT prophylaxis: Enoxaparin,  renal dosing safe in ESRD Code Status: FULL Family Communication: Husband/fianc at bedside MDM and disposition Plan: The below labs and imaging were reviewed and summarized above.  Medication mgmt as above.  The patient initially presented for "skin knots", but then was found to have an unexplained troponin elevation, and chronic chest pain.  While in observation for chest pain and elevated troponin, she developed hypotensive episode followed by acute renal failure due to hypotension and ARB administration.    Her renal function better today.  Nephrology following, likely to SNF if renal function continues to improve.  May be ready for discharge hopefully in the next 24-48 hours.     Consultants:   Cardiology  Neurology  Nephrology   Procedures:   CT head  MRI head  Carotid US  Echocardiogram  Renal ultrasound    Antimicrobials:   None    Subjective: Overall feels better.  No pain reported.  Reports making more urine.  No chest pain or dyspnea reported.  Had BM yesterday..  Objective: Vitals:   09/10/17 1950 09/11/17 0730 09/11/17 0735 09/11/17 1100  BP: (!) 155/53 (!) 147/59  (!) 146/60  Pulse:  82 81 82  Resp: 18  18   Temp: 98.8 F (37.1 C) 99.1 F (37.3 C)  98.8 F (37.1 C)  TempSrc: Oral Oral  Oral  SpO2:  97% 97% 100%  Weight:      Height:  Intake/Output Summary (Last 24 hours) at 09/11/2017 1301 Last data filed at 09/11/2017 0348 Gross per 24 hour  Intake 240 ml  Output 1900 ml  Net -1660 ml   Filed Weights   09/04/17 1959 09/05/17 0228  Weight: 121.1 kg 119 kg    Examination: General appearance: Pleasant elderly female, moderately built and morbidly obese, lying comfortably propped up in bed.  Oral mucosa moist. Cardiac: RRR, no murmurs or JVD.  Trace bilateral ankle edema. Respiratory: Normal respiratory rate and rhythm.  No wheezing, no rales.  No increased work of breathing. Abdomen: Abdomen is nondistended, soft and nontender.   No organomegaly or masses appreciated.  Normal bowel sounds heard. MSK: No deformities or effusions. Neuro: Alert and oriented x3.  No focal neurological deficit. Psych: Judgment and insight may be slightly impaired.  Affect pleasant and appropriate.    Data Reviewed: I have personally reviewed following labs and imaging studies:  CBC: Recent Labs  Lab 09/04/17 2139 09/05/17 0339 09/07/17 0256 09/08/17 0351 09/09/17 0251 09/10/17 0253 09/11/17 0218  WBC 7.5 8.2 5.7 5.5 6.8 7.8 7.7  NEUTROABS 4.3 4.6  --   --   --   --   --   HGB 10.4* 10.7* 9.6* 8.7* 8.8* 8.8* 8.8*  HCT 34.8* 36.4 32.0* 28.9* 29.9* 29.6* 29.7*  MCV 75.7* 74.9* 74.8* 75.7* 75.5* 75.3* 75.0*  PLT 200 210 179 160 169 177 035   Basic Metabolic Panel: Recent Labs  Lab 09/07/17 0256 09/08/17 0351 09/09/17 0251 09/10/17 0253 09/11/17 0218  NA 138 136 132* 134* 137  K 4.3 4.6 5.6* 4.6 4.4  CL 104 104 105 104 103  CO2 26 23 20* 21* 26  GLUCOSE 238* 209* 180* 163* 147*  BUN 48* 59* 69* 78* 82*  CREATININE 3.45* 4.06* 4.53* 4.09* 3.66*  CALCIUM 7.7* 7.1* 7.4* 7.5* 8.2*  PHOS  --   --  4.7* 4.7* 4.7*   GFR: Estimated Creatinine Clearance: 17.8 mL/min (A) (by C-G formula based on SCr of 3.66 mg/dL (H)). Liver Function Tests: Recent Labs  Lab 09/04/17 2139 09/05/17 0339 09/09/17 0251 09/10/17 0253 09/11/17 0218  AST 17 14*  --   --   --   ALT 9 8  --   --   --   ALKPHOS 82 85  --   --   --   BILITOT 0.6 0.9  --   --   --   PROT 7.2 7.0  --   --   --   ALBUMIN 3.3* 3.2* 2.4* 2.5* 2.4*   Recent Labs  Lab 09/04/17 2139  LIPASE 28   Recent Labs  Lab 09/05/17 1538 09/08/17 0351  AMMONIA 36* 30   Cardiac Enzymes: Recent Labs  Lab 09/05/17 0339 09/05/17 0931 09/05/17 1538 09/05/17 2103 09/06/17 0308  CKTOTAL  --   --  68  --   --   TROPONINI <0.03 <0.03 <0.03 <0.03 <0.03   CBG: Recent Labs  Lab 09/10/17 1258 09/10/17 1729 09/10/17 2127 09/11/17 0752 09/11/17 1112  GLUCAP 189*  178* 169* 230* 160*   Anemia Panel: Recent Labs    09/09/17 1018  FERRITIN 452*  TIBC 188*  IRON 108   Urine analysis:    Component Value Date/Time   COLORURINE YELLOW 09/07/2017 0857   APPEARANCEUR CLOUDY (A) 09/07/2017 0857   LABSPEC 1.019 09/07/2017 0857   PHURINE 5.0 09/07/2017 0857   GLUCOSEU NEGATIVE 09/07/2017 0857   GLUCOSEU >=1000 01/02/2009 [place] 09/07/2017 0857  HGBUR negative 03/25/2008 1022   BILIRUBINUR NEGATIVE 09/07/2017 0857   KETONESUR NEGATIVE 09/07/2017 0857   PROTEINUR 100 (A) 09/07/2017 0857   UROBILINOGEN 0.2 09/14/2013 1632   NITRITE NEGATIVE 09/07/2017 0857   LEUKOCYTESUR NEGATIVE 09/07/2017 0857    Recent Results (from the past 240 hour(s))  MRSA PCR Screening     Status: None   Collection Time: 09/05/17  2:42 AM  Result Value Ref Range Status   MRSA by PCR NEGATIVE NEGATIVE Final    Comment:        The GeneXpert MRSA Assay (FDA approved for NASAL specimens only), is one component of a comprehensive MRSA colonization surveillance program. It is not intended to diagnose MRSA infection nor to guide or monitor treatment for MRSA infections. Performed at [facility], [place] [address] [place], [place] [address]          Radiology Studies: No results found.      Scheduled Meds: . aspirin EC  325 mg Oral Daily  . calcium carbonate  500 mg of elemental calcium Oral Q breakfast  . enoxaparin (LOVENOX) injection  30 mg Subcutaneous Daily  . fluticasone  2 spray Each Nare Daily  . fluticasone furoate-vilanterol  1 puff Inhalation Daily  . furosemide  80 mg Oral BID  . gabapentin  100 mg Oral BID  . insulin aspart  0-5 Units Subcutaneous QHS  . insulin aspart  0-9 Units Subcutaneous TID WC  . insulin aspart  5 Units Subcutaneous TID WC  . insulin detemir  18 Units Subcutaneous Daily  . montelukast  10 mg Oral QHS  . pantoprazole  40 mg Oral Daily  . sucralfate  1 g Oral BID   Continuous Infusions:     LOS: 4 days    Time spent: 25 minutes   Vernell Leep, MD, FACP, [facility]. [facility] Pager [phone]  If 7PM-7AM, please contact night-coverage [email or web address] Password [email or web address] 09/11/2017, 1:20 PM

## 2017-09-11 NOTE — Care Management (Addendum)
  Update 1:57pm:  Joann Gutierrez has declined pt with the home first program due to "unsafe for the aide with pts current level of participation".  CM discussed concerns with both CSW AD and attending - recommendations are continued efforts with SNF placements however if pt continues to refuse - HH will be arranged.  Update:  CSW and CM discussed SNF recommendation with pt along with pts significant other Ted.  Both parties listened to rationale of a safe discharge plan as pt and significant other both acknowledged that pt mobility is not at baseline.  Both parties agreed for CSW to fax out to facilities to determine SNF options     Pt refused SNF previously with CM however bedside nurse raised concerns with pt returning home due complete lack of mobility.  Pt documented "pt unsafe to return home due to requiring assistnace for all mobility and ADLs.  CSW and CM will speak with pt together regarding safe discharge plan and continue to encourage SNF at discharge.  CM also referred pt to [facility] program.

## 2017-09-11 NOTE — Progress Notes (Signed)
Occupational Therapy Treatment Patient Details Name: Joann Gutierrez MRN: [id number] DOB: [birth year] Today's Date: 09/11/2017    History of present illness Pt is a 72 y/o female admitted secondary to chest pain and weakness. MRI of the brain was negative. Pt found to have toxic metabolic encephalopathy versus TIA. PMH including but not limited to CAD, CHF, COPD, CKD, CVA, DM, HLD, HTN, seizures, sickle cell trait.   OT comments  This 72 yo female still struggles with overall mobility which leads to decreased independence and safety with basic ADLs. Strongly feel she needs SNF before going home, but not not open to this. Pt's friend that lives with her appears to think he cannot handle her at home (commented "I'm only 130 pounds dripping wet"). We will continue to follow. If pt does go home she will need 24 hour care and HHOT and HHAide  Follow Up Recommendations  SNF;Supervision/Assistance - 24 hour    Equipment Recommendations  3 in 1 bedside commode;Other (comment)(if goes home and does not have one)       Precautions / Restrictions Precautions Precautions: Fall Restrictions Weight Bearing Restrictions: No       Mobility Bed Mobility Overal bed mobility: Needs Assistance Bed Mobility: Supine to Sit     Supine to sit: Min guard;HOB elevated     General bed mobility comments: use of bed rail (at home pt has regular bed without bed rail)  Transfers Overall transfer level: Needs assistance Equipment used: Rolling walker (2 wheeled) Transfers: Sit to/from [facility] Sit to Stand: Mod assist Stand pivot transfers: Mod assist       General transfer comment: modA to powerup. pt min A to stand pivot to recliner    Balance Overall balance assessment: Needs assistance Sitting-balance support: No upper extremity supported;Feet supported Sitting balance-[name]: Good     Standing balance support: Bilateral upper extremity supported;During functional  activity Standing balance-[name]: Poor Standing balance comment: heavy reliance on UE support and outside support by therapist                           ADL either performed or assessed with clinical judgement   ADL Overall ADL's : Needs assistance/impaired                         Toilet Transfer: Stand-pivot;RW;Moderate Print production planner Details (indicate cue type and reason): simulated to recliner                 Vision Baseline Vision/History: Wears glasses Wears Glasses: Reading only Patient Visual Report: No change from baseline            Cognition Arousal/Alertness: Awake/alert Behavior During Therapy: Flat affect Overall Cognitive Status: Impaired/Different from baseline Area of Impairment: Problem solving;Following commands                       Following Commands: Follows one step commands inconsistently     Problem Solving: Difficulty sequencing;Slow processing;Requires verbal cues;Requires tactile cues General Comments: Pt trying to get out of bed with HOB up reached up for upper part of upper bed rail with one hand and I verbally cued her to reach for lower part of same bed rail with her other hand, she could not understand what I was trying to tell her even with putting it several different ways, I had to do hand over hand with her to  this and once I did it and she grabbed rail with both hands she was able to pull herself around to get to EOB                   Pertinent Vitals/ Pain       Pain Assessment: No/denies pain         Frequency  Min 2X/week        Progress Toward Goals  OT Goals(current goals can now be found in the care plan section)  Progress towards OT goals: Progressing toward goals(for bed mobility (but not same set up at home) from toilet transfer remains the same))     Plan Discharge plan remains appropriate       AM-PAC PT "6 Clicks" Daily Activity     Outcome Measure    Help from another person eating meals?: None Help from another person taking care of personal grooming?: A Little Help from another person toileting, which includes using toliet, bedpan, or urinal?: A Lot Help from another person bathing (including washing, rinsing, drying)?: A Lot Help from another person to put on and taking off regular upper body clothing?: A Little Help from another person to put on and taking off regular lower body clothing?: A Lot 6 Click Score: 16    End of Session Equipment Utilized During Treatment: Gait belt;Rolling walker  OT Visit Diagnosis: Unsteadiness on feet (R26.81);Other abnormalities of gait and mobility (R26.89);Muscle weakness (generalized) (M62.81)   Activity Tolerance Patient tolerated treatment well   Patient Left in chair;with call bell/phone within reach;with chair alarm set;with family/visitor present   Nurse Communication Mobility status        Time: 1350-1406 OT Time Calculation (min): 16 min  Charges: OT General Charges $OT Visit: 1 Visit OT Treatments $Self Care/Home Management : 8-22 mins  [address], OTR/L [phone] 09/11/2017

## 2017-09-11 NOTE — Clinical Social Work Note (Addendum)
CSW and RNCM spoke with patient. She still prefers HHPT but is agreeable to CSW sending out SNF referral to see what her options would be. Patient's friend that lives with her came into room during discussion. He would also prefer HHPT. He stated he has been caring for her at home for 12 years. He stated patient does not ambulate around the home very much and only gets out to go to church across the street. CSW had planned to call patient's daughter but they both stated she is her stepdaughter and is not very involved. They would both prefer CSW call her sister Tera Partridge: [phone]). Friend stated she is very involved, coming to the home every day. He stated she would likely not be available until after 1:00. CSW tried calling but phone is off and voicemail full. Will try again after 1:00.  Dayton Scrape, CSW [phone]  1:58 pm CSW spoke with patient's friend. He stated they have decided they definitely want to return home with home health at discharge. CSW has been unable to reach patient's sister. CSW provided friend with CSW contact card so she can call when she arrives tot he hospital. He stated she should be here late this afternoon.  Dayton Scrape, [place]

## 2017-09-12 DIAGNOSIS — R404 Transient alteration of awareness: Secondary | ICD-10-CM | POA: Diagnosis not present

## 2017-09-12 DIAGNOSIS — R079 Chest pain, unspecified: Secondary | ICD-10-CM | POA: Diagnosis not present

## 2017-09-12 DIAGNOSIS — N179 Acute kidney failure, unspecified: Secondary | ICD-10-CM | POA: Diagnosis not present

## 2017-09-12 DIAGNOSIS — G459 Transient cerebral ischemic attack, unspecified: Secondary | ICD-10-CM | POA: Diagnosis not present

## 2017-09-12 DIAGNOSIS — I2510 Atherosclerotic heart disease of native coronary artery without angina pectoris: Secondary | ICD-10-CM | POA: Diagnosis not present

## 2017-09-12 DIAGNOSIS — I959 Hypotension, unspecified: Secondary | ICD-10-CM | POA: Diagnosis not present

## 2017-09-12 DIAGNOSIS — J45909 Unspecified asthma, uncomplicated: Secondary | ICD-10-CM | POA: Diagnosis not present

## 2017-09-12 DIAGNOSIS — G4733 Obstructive sleep apnea (adult) (pediatric): Secondary | ICD-10-CM | POA: Diagnosis not present

## 2017-09-12 DIAGNOSIS — R609 Edema, unspecified: Secondary | ICD-10-CM | POA: Diagnosis not present

## 2017-09-12 DIAGNOSIS — R0602 Shortness of breath: Secondary | ICD-10-CM | POA: Diagnosis not present

## 2017-09-12 DIAGNOSIS — D631 Anemia in chronic kidney disease: Secondary | ICD-10-CM | POA: Diagnosis not present

## 2017-09-12 DIAGNOSIS — Z23 Encounter for immunization: Secondary | ICD-10-CM | POA: Diagnosis not present

## 2017-09-12 DIAGNOSIS — E785 Hyperlipidemia, unspecified: Secondary | ICD-10-CM | POA: Diagnosis not present

## 2017-09-12 DIAGNOSIS — D573 Sickle-cell trait: Secondary | ICD-10-CM | POA: Diagnosis not present

## 2017-09-12 DIAGNOSIS — Z7401 Bed confinement status: Secondary | ICD-10-CM | POA: Diagnosis not present

## 2017-09-12 DIAGNOSIS — I129 Hypertensive chronic kidney disease with stage 1 through stage 4 chronic kidney disease, or unspecified chronic kidney disease: Secondary | ICD-10-CM | POA: Diagnosis not present

## 2017-09-12 DIAGNOSIS — K219 Gastro-esophageal reflux disease without esophagitis: Secondary | ICD-10-CM | POA: Diagnosis not present

## 2017-09-12 DIAGNOSIS — E1122 Type 2 diabetes mellitus with diabetic chronic kidney disease: Secondary | ICD-10-CM | POA: Diagnosis not present

## 2017-09-12 DIAGNOSIS — N183 Chronic kidney disease, stage 3 (moderate): Secondary | ICD-10-CM | POA: Diagnosis present

## 2017-09-12 DIAGNOSIS — J449 Chronic obstructive pulmonary disease, unspecified: Secondary | ICD-10-CM | POA: Diagnosis not present

## 2017-09-12 DIAGNOSIS — K3184 Gastroparesis: Secondary | ICD-10-CM | POA: Diagnosis not present

## 2017-09-12 DIAGNOSIS — R1084 Generalized abdominal pain: Secondary | ICD-10-CM

## 2017-09-12 DIAGNOSIS — E1142 Type 2 diabetes mellitus with diabetic polyneuropathy: Secondary | ICD-10-CM | POA: Diagnosis not present

## 2017-09-12 DIAGNOSIS — E162 Hypoglycemia, unspecified: Secondary | ICD-10-CM | POA: Diagnosis not present

## 2017-09-12 DIAGNOSIS — R0781 Pleurodynia: Secondary | ICD-10-CM | POA: Diagnosis not present

## 2017-09-12 DIAGNOSIS — M2550 Pain in unspecified joint: Secondary | ICD-10-CM | POA: Diagnosis not present

## 2017-09-12 DIAGNOSIS — I10 Essential (primary) hypertension: Secondary | ICD-10-CM | POA: Diagnosis not present

## 2017-09-12 DIAGNOSIS — J309 Allergic rhinitis, unspecified: Secondary | ICD-10-CM | POA: Diagnosis not present

## 2017-09-12 DIAGNOSIS — Z794 Long term (current) use of insulin: Secondary | ICD-10-CM | POA: Diagnosis not present

## 2017-09-12 DIAGNOSIS — I5022 Chronic systolic (congestive) heart failure: Secondary | ICD-10-CM | POA: Diagnosis not present

## 2017-09-12 DIAGNOSIS — N189 Chronic kidney disease, unspecified: Secondary | ICD-10-CM | POA: Diagnosis not present

## 2017-09-12 DIAGNOSIS — M6281 Muscle weakness (generalized): Secondary | ICD-10-CM | POA: Diagnosis not present

## 2017-09-12 DIAGNOSIS — I200 Unstable angina: Secondary | ICD-10-CM | POA: Diagnosis not present

## 2017-09-12 DIAGNOSIS — E1140 Type 2 diabetes mellitus with diabetic neuropathy, unspecified: Secondary | ICD-10-CM | POA: Diagnosis not present

## 2017-09-12 LAB — GLUCOSE, CAPILLARY
GLUCOSE-CAPILLARY: 100 mg/dL — AB (ref 70–99)
Glucose-Capillary: 159 mg/dL — ABNORMAL HIGH (ref 70–99)
Glucose-Capillary: 138 mg/dL — ABNORMAL HIGH (ref 70–99)

## 2017-09-12 LAB — RENAL FUNCTION PANEL
Albumin: 2.3 g/dL — ABNORMAL LOW (ref 3.5–5.0)
Anion gap: 7 (ref 5–15)
BUN: 71 mg/dL — AB (ref 8–23)
CALCIUM: 8.4 mg/dL — AB (ref 8.9–10.3)
CO2: 27 mmol/L (ref 22–32)
CREATININE: 2.59 mg/dL — AB (ref 0.44–1.00)
Chloride: 105 mmol/L (ref 98–111)
GFR, EST AFRICAN AMERICAN: 20 mL/min — AB (ref >60)
GFR, EST NON AFRICAN AMERICAN: 17 mL/min — AB (ref >60)
Glucose, Bld: 131 mg/dL — ABNORMAL HIGH (ref 70–99)
Phosphorus: 4.4 mg/dL (ref 2.5–4.6)
Potassium: 4.2 mmol/L (ref 3.5–5.1)
SODIUM: 139 mmol/L (ref 135–145)

## 2017-09-12 MED ORDER — INSULIN ASPART 100 UNIT/ML ~~LOC~~ SOLN: 0.0000 [IU] | Freq: Three times a day (TID) | SUBCUTANEOUS | Status: DC

## 2017-09-12 MED ORDER — CARVEDILOL 12.5 MG PO TABS
12.5000 mg | ORAL_TABLET | Freq: Two times a day (BID) | ORAL | Status: DC
  Administered 2017-09-12: 12.5 mg via ORAL
  Filled 2017-09-12: qty 1

## 2017-09-12 MED ORDER — GABAPENTIN 100 MG PO CAPS: 100.0000 mg | ORAL_CAPSULE | Freq: Two times a day (BID) | ORAL | Status: DC

## 2017-09-12 MED ORDER — LEVEMIR FLEXTOUCH 100 UNIT/ML ~~LOC~~ SOPN: 18.0000 [IU] | PEN_INJECTOR | Freq: Every day | SUBCUTANEOUS | Status: DC

## 2017-09-12 MED ORDER — INSULIN ASPART 100 UNIT/ML ~~LOC~~ SOLN: 5.0000 [IU] | Freq: Three times a day (TID) | SUBCUTANEOUS | Status: DC

## 2017-09-12 MED ORDER — FUROSEMIDE 80 MG PO TABS: 80.0000 mg | ORAL_TABLET | Freq: Every day | ORAL | Status: DC

## 2017-09-12 NOTE — Clinical Social Work Placement (Signed)
   CLINICAL SOCIAL WORK PLACEMENT  NOTE  Date:  09/12/2017  Patient Details  Name: Joann Gutierrez MRN: [id number] Date of Birth: May 12, 1945  Clinical Social Work is seeking post-discharge placement for this patient at the [place] level of care (*CSW will initial, date and re-position this form in  chart as items are completed):  Yes   Patient/family provided with [facility] Work Department's list of facilities offering this level of care within the geographic area requested by the patient (or if unable, by the patient's family).  Yes   Patient/family informed of their freedom to choose among providers that offer the needed level of care, that participate in Medicare, Medicaid or managed care program needed by the patient, have an available bed and are willing to accept the patient.  Yes   Patient/family informed of [facility]'s ownership interest in [facility] and [facility], as well as of the fact that they are under no obligation to receive care at these facilities.  PASRR submitted to E[employer] on       PASRR number received on       Existing PASRR number confirmed on       FL2 transmitted to all facilities in geographic area requested by pt/family on       FL2 transmitted to all facilities within larger geographic area on       Patient informed that his/her managed care company has contracts with or will negotiate with certain facilities, including the following:        Yes   Patient/family informed of bed offers received.  Patient chooses bed at [facility]     Physician recommends and patient chooses bed at      Patient to be transferred [facility] on 09/12/17.  Patient to be transferred to facility by [facility]     Patient family notified on 09/12/17 of transfer.  Name of family member notified:  [name]- friend     PHYSICIAN       Additional Comment:     _______________________________________________ Weston Anna, LCSW 09/12/2017, 4:28 PM

## 2017-09-12 NOTE — Discharge Instructions (Addendum)

## 2017-09-12 NOTE — Discharge Summary (Addendum)
Physician Discharge Summary  Joann Gutierrez MRN:[id number] DOB: 10/27/1945  PCP: Polite, Ronald, MD  Admit date: 09/04/2017 Discharge date: 09/12/2017  Recommendations for Outpatient Follow-up:  1. MD at SNF in 3 days with repeat labs (CBC & BMP). 2. It is unclear if patient was on Valtrex prior to admission and indications if she was on same.  She was not getting this in the hospital.  It has been discontinued at discharge.  Please verify this with her outpatient PCP if she needs to be on Valtrex.,  It appears that she had gotten acyclovir briefly in November 2017 for herpes zoster of the V2 distribution. 3. Rhonda Barrett, PA-C/Cardiology on 10/07/2017 at 10:30 AM. 4. Dr. Jay Patel, Nephrology in 2 weeks. 5. Dr. Pramod Sethi, Neurology in 4 weeks. 6. Dr. Ronald Polite, PCP upon discharge from SNF.  Home Health: Patient being discharged to SNF. Equipment/Devices: None.  Discharge Condition: Improved and stable. CODE STATUS: Full Diet recommendation: Healthy and diabetic diet.  Discharge Diagnoses:  Principal Problem:   Chest pain Active Problems:   Chronic obstructive asthma (HCC)   Gastroparesis   CKD (chronic kidney disease), stage III (HCC)   Essential hypertension   Insulin dependent diabetes mellitus (HCC)   Morbid obesity due to excess calories (HCC)   Hypertensive emergency   Anemia of chronic disease   ARF (acute renal failure) (HCC)   Brief Summary: Joann Gutierrez is a 72 y.o. F with complex PMHx, COPD not on home O2, HTN, CKD III baseline Cr 1.8, dCHF, OSA, DM, chronic anemia, gastroparesis, and chronic widespread pain complaints who presented initially with complaints of "'knots' on her left neck, under left breast, and on left side of abdomen" for several weeks, then family added reports of "more confused than usually" and "mild odor" to urine, then to second ED provider reported abdominal pain "like there is a mass in her left upper quadrant and right lower  quadrant. Ongoing for 2 weeks".  As a result of her abdominal pain, a troponin was obtained that was 0.03 ng/mL, after which the patient was asked a third time if she had chest pain and this time said yes (had been previously documented as having no chest pain), and so TRH were asked to observe for cardiac rule out.  Shortly after admission to the hospital, the patient had an episode of hypotension and altered mental status.  CODE STROKE was called by nursing, and Neurology were consulted.   Assessment & Plan:  Acute on stage III chronic renal failure with oliguria Not the presenting complaint, developed this since admission.   Baseline Cr 1.8, continued to worsen and peaked at 4.5 on 8/20.   Renal ultrasound shows chronic renal disease no hydronephrosis.   Got contrast on day of admission.  Was hypertensive, taking ARB, Lasix, metolazone, then had transient 90 minute hypotensive episode (believed to be vagal episode).  Unfortunately also given dose of ARB, Lasix after creatinine started to rise.  Has low-grade macroalbuminemia, no white blood cells or red blood cells on microscopy. -ARB discontinued.  Diuretics were temporarily held and reinitiated.  Metolazone discontinued. -Nephrology consultation and follow-up appreciated.  Suspect etiology of acute renal failure to be a mix of ischemic and toxic ATN related to hypotensive episode and CT contrast.  Lasix was resumed at 80 mg twice daily.  Output has steadily improved.  Nephrology has seen and reduced Lasix to 80 mg daily which may have been her home dose.  Carvedilol resumed due to hypertension but   continuing to hold other antihypertensives.  Nephrology signed off.  Outpatient follow-up with her nephrologist in 2 weeks. Improved.  Hyperkalemia Status post Kayexalate 8/20.  Resolved.  Secondary to acute on chronic kidney disease. Continue to hold potassium supplements and ARB at discharge.  Follow-up as outpatient with  BMPs.  Abdominal pain See below, the patient has widespread soft tissue pain.  Her LFTs are normal and CT abdomen on admission and then again 8/16 in context of her hypotension were completely normal. The etiology of this is unclear, but no indications to suggest infection, intra-abdominal perforation, peritonitis or hemorrhage. Resolved.  Having BMs.  Elevated troponin ACS is not present.  Cardiology were consulted, they recommend echocardiogram.  Cardiology did not recommend an ischemic work-up, they have signed off. TTE 09/08/2017: LVEF 65-70% and grade 1 diastolic dysfunction. There is some question of history of hypertrophic cardiomyopathy as per cardiology sign off note.  TTE shows moderate LVH, hyperdynamic LVEF with mild cavity obliteration and dynamic obstruction with resting gradient of 5 mmHg in the mid LV cavity to LVOT and grade 1 diastolic dysfunction.  Carvedilol resumed.  May follow outpatient with cardiology.  Possible TIA, doubted Her episode of decreased responsiveness on the day of admission occurred in the context of hypotension, and appeared to me to be simple presyncope.  However, it was felt that TIA was not possible to rule out, given nursing initial observation of left sided weakness, however, it was felt to be unlikely.  MRI negative for stroke. Echocardiogram without cardiogenic source of clot. Carotid imaging unremarkable.  -Aspirin 81 mg daily -Neurology was consulted and patient completed stroke evaluation.  No residual deficits.  Neurology recommended continuing prior aspirin 81 mg daily.  SNF for therapies.  Outpatient follow-up with neurology.   Hyperlipidemia LDL here very elevated.  Was on statin at admission, purportedly.  In setting of her presenting complaint (widespread soft tissue pain), it was felt reasonable to trial off all statin therapy and monitor symptoms. Her generalized aches and pains have improved.  Thereby it is possible that she may have had  statin related myalgias.  We will continue to hold statins at discharge but needs close outpatient follow-up to consider changing to a different statin with Co-q10 or consider PC SK 9 inhibitors as outpatient.  Defer this to her outpatient PCP follow-up.  Continued Zetia at discharge.  COPD  No symptoms -Continue home ICS/LABA, Flonase -Bronchodilators as needed -Continue Singulair, PPI -Stable.  Hypertension Antihypertensives which were held after episode of hypotension and acute kidney injury except Lasix which has been resumed.   Now that blood pressures are starting to rise, carvedilol resumed 8/23.  Continue to hold losartan, metolazone, doxazosin, hydralazine and Imdur. Monitor blood pressure closely and titrate medications gradually as needed.  Chronic abdominal pain Has not complained of abdominal pain and couple of days now.  Sucralfate discontinued due to acute renal insufficiency and to avoid aluminum toxicity.  Continue PPI.  Diabetes A1c markedly elevated at 11.5 suggesting poor outpatient control.  Increased Levemir from 15 to 18 units daily.  Added SSI and mealtime NovoLog.  Monitor CBGs closely at SNF and adjust doses as needed. -Reduce gabapentin dose in AKI   Anemia of chronic kidney disease Hemoglobin stable in the 8.8 g range.  Chest pain Widespread soft tissue pain This was the reason for admission, although not the chief presenting complaint (she had an incidentally noted detectable troponin, was subsequently asked if she has chest pain, which she does, chronically, as  well as widespread soft tissue pain (she and husband are vague historians, but this is supported by previous admission histories/exams).   The differential for this would probably be fibromyalgia, statin-induced myopathy, and inflammatory myopathies.  Former seems unlikely to develop this stage in life, and latter seem unlikely with normal CK.  TSH normal.  ANA  Neg.  ESR minimal elevation.  Her  chest pain was clearly reproducible on exam.  Cardiology believes this is chest wall pain from arthritis. -Trial off statin, follow up with PCP  OSA -Continue CPAP  Morbid obesity/Body mass index is 44.34 kg/m.      Consultants:   Cardiology  Neurology  Nephrology   Procedures:   CT head  MRI head  Carotid US  Echocardiogram  Renal ultrasound   Discharge Instructions  Discharge Instructions    (HEART FAILURE PATIENTS) Call MD:  Anytime you have any of the following symptoms: 1) 3 pound weight gain in 24 hours or 5 pounds in 1 week 2) shortness of breath, with or without a dry hacking cough 3) swelling in the hands, feet or stomach 4) if you have to sleep on extra pillows at night in order to breathe.   Complete by:  As directed    Call MD for:   Complete by:  As directed    Strokelike symptoms.   Call MD for:  difficulty breathing, headache or visual disturbances   Complete by:  As directed    Call MD for:  extreme fatigue   Complete by:  As directed    Call MD for:  persistant dizziness or light-headedness   Complete by:  As directed    Call MD for:  persistant nausea and vomiting   Complete by:  As directed    Call MD for:  severe uncontrolled pain   Complete by:  As directed    Call MD for:  temperature >100.4   Complete by:  As directed    Diet - low sodium heart healthy   Complete by:  As directed    Diet Carb Modified   Complete by:  As directed    Increase activity slowly   Complete by:  As directed        Medication List    STOP taking these medications   atorvastatin 40 MG tablet Commonly known as:  LIPITOR   azelastine 0.1 % nasal spray Commonly known as:  ASTELIN   doxazosin 4 MG tablet Commonly known as:  CARDURA   hydrALAZINE 100 MG tablet Commonly known as:  APRESOLINE   isosorbide mononitrate 120 MG 24 hr tablet Commonly known as:  IMDUR   ketorolac 0.5 % ophthalmic solution Commonly known as:  ACULAR    KLOR-CON M20 20 MEQ tablet Generic drug:  potassium chloride SA   losartan 100 MG tablet Commonly known as:  COZAAR   metolazone 2.5 MG tablet Commonly known as:  ZAROXOLYN   mometasone-formoterol 100-5 MCG/ACT Aero Commonly known as:  DULERA   sucralfate 1 g tablet Commonly known as:  CARAFATE   valACYclovir 500 MG tablet Commonly known as:  VALTREX     TAKE these medications   albuterol 108 (90 Base) MCG/ACT inhaler Commonly known as:  PROVENTIL HFA;VENTOLIN HFA Inhale 2 puffs into the lungs every 6 (six) hours as needed for wheezing or shortness of breath.   aspirin EC 81 MG tablet Take 81 mg by mouth daily.   calcium carbonate 1500 (600 Ca) MG Tabs tablet Commonly known as:  OSCAL  Take 600 mg of elemental calcium by mouth daily with breakfast.   carvedilol 12.5 MG tablet Commonly known as:  COREG Take 12.5 mg by mouth 2 (two) times daily with a meal.   ezetimibe 10 MG tablet Commonly known as:  ZETIA Take 10 mg by mouth daily.   fluticasone 50 MCG/ACT nasal spray Commonly known as:  FLONASE USE TWO SPRAYS INTO BOTH NOSTRILS DAILY   fluticasone furoate-vilanterol 100-25 MCG/INH Aepb Commonly known as:  BREO ELLIPTA Inhale 1 puff into the lungs daily.   furosemide 80 MG tablet Commonly known as:  LASIX Take 1 tablet (80 mg total) by mouth daily. What changed:  when to take this   gabapentin 100 MG capsule Commonly known as:  NEURONTIN Take 1 capsule (100 mg total) by mouth 2 (two) times daily. What changed:    medication strength  how much to take   guaiFENesin 600 MG 12 hr tablet Commonly known as:  MUCINEX Take 2 tablets (1,200 mg total) by mouth 2 (two) times daily as needed for cough or to loosen phlegm.   insulin aspart 100 UNIT/ML injection Commonly known as:  novoLOG Inject 0-9 Units into the skin 3 (three) times daily with meals. CBG < 70: implement hypoglycemia protocol CBG 70 - 120: 0 units CBG 121 - 150: 1 unit CBG 151 - 200: 2  units CBG 201 - 250: 3 units CBG 251 - 300: 5 units CBG 301 - 350: 7 units CBG 351 - 400: 9 units CBG > 400: call MD.   insulin aspart 100 UNIT/ML injection Commonly known as:  novoLOG Inject 5 Units into the skin 3 (three) times daily with meals.   LEVEMIR FLEXTOUCH 100 UNIT/ML Pen Generic drug:  Insulin Detemir Inject 18 Units into the skin daily. Start taking on:  09/13/2017 What changed:    how much to take  how to take this  when to take this   meclizine 25 MG tablet Commonly known as:  ANTIVERT Take 1 tablet (25 mg total) by mouth 3 (three) times daily as needed for dizziness.   montelukast 10 MG tablet Commonly known as:  SINGULAIR TAKE ONE TABLET BY MOUTH ONCE DAILY What changed:    how much to take  how to take this  when to take this   nitroGLYCERIN 0.4 MG SL tablet Commonly known as:  NITROSTAT PLACE ONE TABLET UNDER THE TONGUE EVERY FIVE MINUTES AS NEEDED FOR CHEST PAIN   omeprazole 20 MG capsule Commonly known as:  PRILOSEC Take 1 capsule (20 mg total) by mouth 2 (two) times daily before a meal.   OXYGEN Inhale 2 L into the lungs continuous. CPAP with oxygen at bedtime   raloxifene 60 MG tablet Commonly known as:  EVISTA Take 60 mg by mouth every morning.   TRUEPLUS PEN NEEDLES 32G X 4 MM Misc Generic drug:  Insulin Pen Needle INJECT THREE TIMES A DAY AS DIRECTED   Vitamin D (Ergocalciferol) 50000 units Caps capsule Commonly known as:  DRISDOL Take 50,000 Units by mouth every Monday.      Follow-up Information    Minus Breeding, MD Follow up.   Specialty:  Cardiology Why:  Cardiology hospital follow-up with Calton Golds, Dr. Rosezella Florida PA on September 17th at 10:30.  Please arrive 15 minutes early for check-in. Contact information: [address] [place] [address] [phone]        Seward Carol, MD. Schedule an appointment as soon as possible for a visit.  Specialty:  Internal Medicine Why:  Upon discharge  from SNF. Contact information: [address] [place] [address] [phone]        MD at SNF. Schedule an appointment as soon as possible for a visit in 3 day(s).   Why:  To be seen with repeat labs (CBC & BMP).       Patel, Jay, MD. Schedule an appointment as soon as possible for a visit in 2 week(s).   Specialty:  Nephrology Contact information: [address] [place] [address] [phone]        Sethi, Pramod S, MD. Schedule an appointment as soon as possible for a visit in 4 week(s).   Specialties:  Neurology, Radiology Contact information: [address]  [place] [address] [phone]          Allergies  Allergen Reactions  . Morphine And Related Other (See Comments)    Family request not to be given, reports pt does not wake up when given   . Promethazine Hcl Other (See Comments)    REACTION: lethargy      Procedures/Studies: Ct Abdomen Pelvis Wo Contrast  Result Date: 09/05/2017 CLINICAL DATA:  72 y/o  F; abdominal distention. EXAM: CT ABDOMEN AND PELVIS WITHOUT CONTRAST TECHNIQUE: Multidetector CT imaging of the abdomen and pelvis was performed following the standard protocol without IV contrast. COMPARISON:  09/04/2017 CT abdomen and pelvis. FINDINGS: Lower chest: Mild cardiomegaly. Small focus of consolidation within the basilar right lower lobe may represent atelectasis or pneumonia. Hepatobiliary: No focal liver abnormality is seen. No gallstones, gallbladder wall thickening, or biliary dilatation. Excretion of contrast into the gallbladder. Pancreas: Unremarkable. No pancreatic ductal dilatation or surrounding inflammatory changes. Spleen: Normal in size without focal abnormality. Adrenals/Urinary Tract: Adrenal glands are unremarkable. Multiple renal cysts measuring up to 3.9 cm in the left kidney lower pole. Otherwise kidneys are normal, without renal calculi, focal lesion, or hydronephrosis. Bladder is unremarkable.  Contrast excretion into the renal collecting systems. Stomach/Bowel: Stomach is within normal limits. Appendix not identified, no pericecal inflammation. No evidence of bowel wall thickening, distention, or inflammatory changes. Diverticulosis without findings of acute diverticulitis. Vascular/Lymphatic: Aortic atherosclerosis. No enlarged abdominal or pelvic lymph nodes. Reproductive: Status post hysterectomy. No adnexal masses. Other: No abdominal wall hernia or abnormality. No abdominopelvic ascites. Musculoskeletal: No fracture is seen. IMPRESSION: 1. Small focus of consolidation within the basilar right lower lobe may represent atelectasis or pneumonia. 2. No acute process of the abdomen and pelvis identified. 3. Mild cardiomegaly. 4. Diverticulosis without findings of acute diverticulitis. 5. Aortic atherosclerosis. Electronically Signed   By: Lance  Furusawa-Stratton M.D.   On: 09/05/2017 20:51   Dg Chest 2 View  Result Date: 09/04/2017 CLINICAL DATA:  Dyspnea EXAM: CHEST - 2 VIEW COMPARISON:  08/12/2017 FINDINGS: Cardiomegaly with vascular congestion. Chronic elevation of right diaphragm. No pleural effusion. No focal airspace disease. IMPRESSION: Cardiomegaly with vascular congestion. Electronically Signed   By: Kim  Fujinaga M.D.   On: 09/04/2017 21:58   Ct Head Wo Contrast  Result Date: 09/08/2017 CLINICAL DATA:  Initial evaluation for acute altered mental status. EXAM: CT HEAD WITHOUT CONTRAST TECHNIQUE: Contiguous axial images were obtained from the base of the skull through the vertex without intravenous contrast. COMPARISON:  Previous MRI from 09/05/2017 FINDINGS: Brain: Atrophy with chronic small vessel ischemic disease. Small remote left cerebellar infarct. No acute intracranial hemorrhage. No acute large vessel territory infarct. No mass lesion, midline shift or mass effect. No hydrocephalus. No extra-axial   fluid collection. Vascular: No hyperdense vessel. Scattered vascular calcifications  noted within the carotid siphons. Skull: Scalp soft tissues and calvarium within normal limits. Sinuses/Orbits: Globes and orbital soft tissues demonstrate no acute abnormality. Trace layering opacity within the left sphenoid sinus. Paranasal sinuses otherwise clear. No mastoid effusion. Other: None. IMPRESSION: 1. No acute intracranial abnormality. 2. Stable atrophy with chronic small vessel ischemic disease. Small remote left cerebellar infarct. Electronically Signed   By: Benjamin  McClintock M.D.   On: 09/08/2017 02:28   Mr Brain Wo Contrast  Result Date: 09/05/2017 CLINICAL DATA:  Stroke follow-up. Altered mental status. Hemiparesis and facial droop. EXAM: MRI HEAD WITHOUT CONTRAST TECHNIQUE: Multiplanar, multiecho pulse sequences of the brain and surrounding structures were obtained without intravenous contrast. COMPARISON:  Head CT 09/05/2017.  Brain MRI 08/12/2017. FINDINGS: BRAIN: There is no acute infarct, acute hemorrhage or mass effect. The midline structures are normal. There is an old left cerebellar infarct. Diffuse confluent hyperintense T2-weighted signal within the periventricular, deep and juxtacortical white matter, most commonly due to chronic ischemic microangiopathy. Generalized atrophy without lobar predilection. Single focus of chronic microhemorrhage in the right brainstem unchanged from prior MRI. VASCULAR: Major intracranial arterial and venous sinus flow voids are preserved. SKULL AND UPPER CERVICAL SPINE: The visualized skull base, calvarium, upper cervical spine and extracranial soft tissues are normal. SINUSES/ORBITS: No fluid levels or advanced mucosal thickening. No mastoid or middle ear effusion. The orbits are normal. IMPRESSION: 1. No acute intracranial abnormality. 2. 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 3. Single focus of chronic microhemorrhage in the right brainstem may correspond to the area gliosis seen on the head CT. This is unchanged from  08/12/2017. Electronically Signed   By: Kevin  Herman M.D.   On: 09/05/2017 13:55   Ct Abdomen Pelvis W Contrast  Result Date: 09/04/2017 CLINICAL DATA:  Abdominal pain palpable subcutaneous nodules EXAM: CT ABDOMEN AND PELVIS WITH CONTRAST TECHNIQUE: Multidetector CT imaging of the abdomen and pelvis was performed using the standard protocol following bolus administration of intravenous contrast. CONTRAST:  100mL OMNIPAQUE IOHEXOL 300 MG/ML  SOLN COMPARISON:  12/14/2015 FINDINGS: Lower chest: Lung bases are well aerated with the exception of some stable right lower lobe scarring. Mild cardiomegaly is noted. Hepatobiliary: No focal liver abnormality is seen. No gallstones, gallbladder wall thickening, or biliary dilatation. Pancreas: Unremarkable. No pancreatic ductal dilatation or surrounding inflammatory changes. Spleen: Normal in size without focal abnormality. Adrenals/Urinary Tract: Adrenal glands are within normal limits. The kidneys are well visualized bilaterally. No obstructive changes or renal calculi are noted. The bladder is partially distended. Bilateral stable renal cysts are noted. Stomach/Bowel: Diverticular change of the colon is noted without evidence of diverticulitis. The appendix is not well visualized. No inflammatory changes to suggest appendicitis are noted. No obstructive changes are seen. Vascular/Lymphatic: Aortic atherosclerosis. No enlarged abdominal or pelvic lymph nodes. Reproductive: Status post hysterectomy. No adnexal masses. Other: No abdominal wall hernia or abnormality. No abdominopelvic ascites. Musculoskeletal: Degenerative changes of lumbar spine are noted. IMPRESSION: Diverticulosis without diverticulitis. Stable renal cysts. No acute abnormality noted. Electronically Signed   By: Mark  Lukens M.D.   On: 09/04/2017 23:50   Us Renal  Result Date: 09/07/2017 CLINICAL DATA:  Acute renal failure. EXAM: RENAL / URINARY TRACT ULTRASOUND COMPLETE COMPARISON:  Body CT  09/05/2017 FINDINGS: Right Kidney: Length: 10.2 cm. Echogenicity within normal limits. No solid mass or hydronephrosis visualized. Benign-appearing cyst in the midpole region of the right kidney measures 1.8 cm in diameter. Left Kidney: Length:   10.7 cm. Echogenicity within normal limits. No solid mass or hydronephrosis visualized. Benign-appearing cyst in the lower pole of the left kidney measures 3.4 cm in diameter. Bladder: Not seen. IMPRESSION: The exam was limited due to patient's body habitus. Bilateral benign-appearing renal cyst, otherwise normal appearance of the kidneys. The urinary bladder was not visualized. Electronically Signed   By: Dobrinka  Dimitrova M.D.   On: 09/07/2017 18:09   Ct Head Code Stroke Wo Contrast  Result Date: 09/05/2017 CLINICAL DATA:  Code stroke. 72-year-old female with altered mental status. EXAM: CT HEAD WITHOUT CONTRAST TECHNIQUE: Contiguous axial images were obtained from the base of the skull through the vertex without intravenous contrast. COMPARISON:  Brain MRI 08/12/2017. Head CT without contrast 10/24/2016, and earlier. FINDINGS: Brain: Stable cerebral volume since 2018. Patchy bilateral cerebral white matter hypodensity is stable. 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 Small chronic left thalamic lacune is evident. Questionable new small area of hypodensity in the right pons (series 3, image 12), but could be artifact. No midline shift, ventriculomegaly, mass effect, evidence of mass lesion, intracranial hemorrhage or evidence of cortically based acute infarction. Vascular: Calcified atherosclerosis at the skull base. No suspicious intracranial vascular hyperdensity. Skull: Stable, negative. Sinuses/Orbits: Visualized paranasal sinuses and mastoids are stable and well pneumatized. Other: Chronic postoperative changes to the left globe. No acute orbit or scalp soft tissue findings. Negative visible noncontrast deep soft tissue spaces of the  face. ASPECTS (Alberta Stroke Program Early CT Score) - Ganglionic level infarction (caudate, lentiform nuclei, internal capsule, insula, M1-M3 cortex): 7 - Supraganglionic infarction (M4-M6 cortex): 3 Total score (0-10 with 10 being normal): 10 IMPRESSION: 1. Questionable new hypodense lacune in the right pons, but may be artifact. 2. Otherwise stable chronic small vessel ischemia. No acute intracranial hemorrhage or cortically based infarct. 3. ASPECTS is 10. 4. These results were communicated to Dr. Arora at 12:47 pmon 8/16/2019by text page via the [employer] messaging system. Electronically Signed   By: H  Hall M.D.   On: 09/05/2017 12:47      Subjective: Interviewed and examined along with her significant other at bedside.  Patient denies complaints.  Specifically denies pain, chest pain, dyspnea, headache.  Reports urinating well.  As per RN, no acute issues reported.  Discharge Exam:  Vitals:   09/11/17 2007 09/11/17 2212 09/12/17 0729 09/12/17 0806  BP: (!) 146/59   (!) 179/76  Pulse:   80   Resp:  16 16   Temp: 98.3 F (36.8 C)   98.3 F (36.8 C)  TempSrc: Oral   Oral  SpO2:   98% 100%  Weight:      Height:        General appearance: Pleasant elderly female, moderately built and morbidly obese, lying comfortably propped up in bed.  Oral mucosa moist. Cardiac: RRR, no murmurs or JVD.  Trace bilateral ankle edema. Respiratory: Normal respiratory rate and rhythm.  No wheezing, no rales.  No increased work of breathing. Abdomen: Abdomen is nondistended, soft and nontender.  No organomegaly or masses appreciated.  Normal bowel sounds heard. MSK: No deformities or effusions. Neuro: Alert and oriented x3.  No focal neurological deficit. Psych: Judgment and insight may be slightly impaired.  Affect pleasant and appropriate.  Does seem to have some memory and cognitive impairment.    The results of significant diagnostics from this hospitalization (including imaging, microbiology,  ancillary and laboratory) are listed below for reference.     Microbiology: Recent Results (from the past   240 hour(s))  MRSA PCR Screening     Status: None   Collection Time: 09/05/17  2:42 AM  Result Value Ref Range Status   MRSA by PCR NEGATIVE NEGATIVE Final    Comment:        The GeneXpert MRSA Assay (FDA approved for NASAL specimens only), is one component of a comprehensive MRSA colonization surveillance program. It is not intended to diagnose MRSA infection nor to guide or monitor treatment for MRSA infections. Performed at [facility], [address] , [place] [address]      Labs: CBC: Recent Labs  Lab 09/07/17 0256 09/08/17 0351 09/09/17 0251 09/10/17 0253 09/11/17 0218  WBC 5.7 5.5 6.8 7.8 7.7  HGB 9.6* 8.7* 8.8* 8.8* 8.8*  HCT 32.0* 28.9* 29.9* 29.6* 29.7*  MCV 74.8* 75.7* 75.5* 75.3* 75.0*  PLT 179 160 169 177 192   Basic Metabolic Panel: Recent Labs  Lab 09/08/17 0351 09/09/17 0251 09/10/17 0253 09/11/17 0218 09/12/17 0249  NA 136 132* 134* 137 139  K 4.6 5.6* 4.6 4.4 4.2  CL 104 105 104 103 105  CO2 23 20* 21* 26 27  GLUCOSE 209* 180* 163* 147* 131*  BUN 59* 69* 78* 82* 71*  CREATININE 4.06* 4.53* 4.09* 3.66* 2.59*  CALCIUM 7.1* 7.4* 7.5* 8.2* 8.4*  PHOS  --  4.7* 4.7* 4.7* 4.4   Liver Function Tests: Recent Labs  Lab 09/09/17 0251 09/10/17 0253 09/11/17 0218 09/12/17 0249  ALBUMIN 2.4* 2.5* 2.4* 2.3*   BNP (last 3 results) Recent Labs    09/04/17 2139  BNP 58.5   Cardiac Enzymes: Recent Labs  Lab 09/05/17 2103 09/06/17 0308  TROPONINI <0.03 <0.03   CBG: Recent Labs  Lab 09/11/17 1112 09/11/17 1651 09/11/17 2110 09/12/17 0808 09/12/17 1222  GLUCAP 160* 124* 90 100* 159*   Urinalysis    Component Value Date/Time   COLORURINE YELLOW 09/07/2017 0857   APPEARANCEUR CLOUDY (A) 09/07/2017 0857   LABSPEC 1.019 09/07/2017 0857   PHURINE 5.0 09/07/2017 0857   GLUCOSEU NEGATIVE 09/07/2017 0857    GLUCOSEU >=1000 01/02/2009 1101   HGBUR NEGATIVE 09/07/2017 0857   HGBUR negative 03/25/2008 1022   BILIRUBINUR NEGATIVE 09/07/2017 0857   KETONESUR NEGATIVE 09/07/2017 0857   PROTEINUR 100 (A) 09/07/2017 0857   UROBILINOGEN 0.2 09/14/2013 1632   NITRITE NEGATIVE 09/07/2017 0857   LEUKOCYTESUR NEGATIVE 09/07/2017 0857    I discussed with patient's daughter, updated care and answered questions.  I have been updating patient and her significant other at bedside with ongoing care on a daily basis.  Time coordinating discharge: 60 minutes  SIGNED:   , MD, FACP, FHM. [facility] Pager [phone]  If 7PM-7AM, please contact night-coverage [email or web address] Password [email or web address] 09/12/2017, 3:38 PM  

## 2017-09-12 NOTE — Progress Notes (Addendum)
Subjective: Interval History: Patient feeling good today.  No complaints or questions. Uop of 4.8L last 24hrs. Cr improving.  Objective: Vital signs in last 24 hours: Temp:  [98.3 F (36.8 C)-98.8 F (37.1 C)] 98.3 F (36.8 C) (08/23 0806) Pulse Rate:  [79-82] 80 (08/23 0729) Resp:  [16] 16 (08/23 0729) BP: (144-179)/(56-76) 179/76 (08/23 0806) SpO2:  [98 %-100 %] 100 % (08/23 0806) Weight change:   Intake/Output from previous day: 08/22 0701 - 08/23 0700 In: 360 [P.O.:360] Out: 5200 [Urine:5200] Intake/Output this shift: No intake/output data recorded.  Physical Exam  Constitutional: She appears well-developed and well-nourished. No distress.  Cardiovascular: Normal rate, regular rhythm, normal heart sounds and intact distal pulses.  Pulmonary/Chest: Effort normal and breath sounds normal. No respiratory distress.  Abdominal: Soft. Bowel sounds are normal.  Musculoskeletal: She exhibits no edema or deformity.  Neurological: She is alert.  Skin: Skin is warm and dry.    Lab Results: Recent Labs    09/10/17 0253 09/11/17 0218  WBC 7.8 7.7  HGB 8.8* 8.8*  HCT 29.6* 29.7*  PLT 177 192   BMET:  Recent Labs    09/11/17 0218 09/12/17 0249  NA 137 139  K 4.4 4.2  CL 103 105  CO2 26 27  GLUCOSE 147* 131*  BUN 82* 71*  CREATININE 3.66* 2.59*  CALCIUM 8.2* 8.4*   No results for input(s): PTH in the last 72 hours. Iron Studies:  Recent Labs    09/09/17 1018  IRON 108  TIBC 188*  FERRITIN 452*   CBG (last 3)  Recent Labs    09/11/17 1651 09/11/17 2110 09/12/17 0808  GLUCAP 124* 90 100*   Studies/Results: No results found.  I have reviewed the patient's current medications.  Assessment/Plan: 1. Acute on Chronic Renal Failure: Cr Trend 4.0>>4.5>>4.0>>3.6>>2.6. Patient with CKD III (Baseline Cr 1.8) who developed A/C RF following hypotensive episode and CT Contrast. Suspect mix of Ischemic and toxic ATN. Responding to home lasix. UOP increased to 4.8L  last 24 hrs. Renal function improving, Cr 2.6 today. > Renal U/S: Bilateral benign cysts, otherwise normal > BUN improving, now 71 > Renal function recovering - Reduce Lasix dose to 80mg  Daily - Nephrology to sign off  2. Hypertension: BP 244W-102V systolic.  - On Lasix 80mg  Daily as above - Restart home Coreg - Continue to hold ARB on discharge, she will follow up with Nephrology outpatient for consideration of restarting this - Rest per primary  3. Anemia of CKD: Hgb stable at 8.8 today. Receives ESA outpatient - Fe, Ferritin Good, TIBC low - Aranesp 1101mcg every 2 weeks, last dose 8/20  4. Diabetes, Abdominal Pain, COPD, OSA, Soft Tissue Pain, ?TIA: Per Primary Team   LOS: 5 days   Neva Seat 09/12/2017,8:33 AM   Patient seen and examined, agree with above note with above modifications. Pt doing well, recovering from her AKI- great UOP- in fact will decrease lasix from 80 BID to 80 daily which could be her home dose.  Resume coreg for high BP but hold ARB until seen back by her nephrologist.  Renal will sign off, call with questions  Corliss Parish, MD 09/12/2017

## 2017-09-12 NOTE — Progress Notes (Signed)
Phone call made to Lorayne Bender, who claims she is patient's daughter but patient states she raised [place] but did not adopt her.  Lorayne Bender originally called demanding specific health information.  This Probation officer, after requesting permission from patient and patient's significant other, with permission denied, explained to [place] that per HIPAA policy, I could not share health information with her.  I requested Lorayne Bender call and speak with patient, significant other, or patient's sister for further information.  This call was witnessed by a second RN, Amina Mouhamed.

## 2017-09-12 NOTE — Progress Notes (Signed)
Physical Therapy Treatment Patient Details Name: Joann Gutierrez MRN: [id number] DOB: August 15, 1945 Today's Date: 09/12/2017    History of Present Illness Pt is a 72 y/o female admitted secondary to chest pain and weakness. MRI of the brain was negative. Pt found to have toxic metabolic encephalopathy versus TIA. PMH including but not limited to CAD, CHF, COPD, CKD, CVA, DM, HLD, HTN, seizures, sickle cell trait.    PT Comments    Patient without progression this visit. Pt and friend report she has not moved out of bed all day. Unable to rise to standing with me with max A today x4 trials. Pt unsafe to return home, will need SNF. Focused on therex and bed mobility.      Follow Up Recommendations  SNF     Equipment Recommendations  None recommended by PT    Recommendations for Other Services       Precautions / Restrictions Precautions Precautions: Fall Restrictions Weight Bearing Restrictions: No    Mobility  Bed Mobility Overal bed mobility: Needs Assistance Bed Mobility: Supine to Sit     Supine to sit: Min guard;HOB elevated Sit to supine: Min assist   General bed mobility comments: min A to assist pt up pad to position EOB, pt with signifcant effort and utilizn gbed rail to pull herself up  Transfers Overall transfer level: Needs assistance Equipment used: Rolling walker (2 wheeled) Transfers: Sit to/from Stand Sit to Stand: Total assist         General transfer comment: Pt unable to stand today with max A from bed an duse of bed pad. x4 trials. fatigued. did EOB ezercises and returned supine  Ambulation/Gait                 Stairs             Wheelchair Mobility    Modified Rankin (Stroke Patients Only)       Balance Overall balance assessment: Needs assistance Sitting-balance support: No upper extremity supported;Feet supported Sitting balance-[name]: Good                                      Cognition  Arousal/Alertness: Awake/alert Behavior During Therapy: Flat affect Overall Cognitive Status: Impaired/Different from baseline Area of Impairment: Problem solving;Following commands                 Orientation Level: Disoriented to;Situation Current Attention Level: Sustained Memory: Decreased short-term memory Following Commands: Follows one step commands inconsistently Safety/Judgement: Decreased awareness of safety;Decreased awareness of deficits Awareness: Intellectual Problem Solving: Difficulty sequencing;Slow processing;Requires verbal cues;Requires tactile cues        Exercises General Exercises - Lower Extremity Long Arc Quad: AROM;Both;10 reps;Seated Hip ABduction/ADduction: 10 reps Straight Leg Raises: 10 reps    General Comments        Pertinent Vitals/Pain Pain Assessment: No/denies pain    Home Living                      Prior Function            PT Goals (current goals can now be found in the care plan section) Acute Rehab PT Goals Patient Stated Goal: didn't state PT Goal Formulation: With patient Time For Goal Achievement: 09/20/17 Potential to Achieve Goals: Good Progress towards PT goals: Progressing toward goals    Frequency    Min 2X/week  PT Plan Current plan remains appropriate    Co-evaluation              AM-PAC PT "6 Clicks" Daily Activity  Outcome Measure  Difficulty turning over in bed (including adjusting bedclothes, sheets and blankets)?: Unable Difficulty moving from lying on back to sitting on the side of the bed? : Unable Difficulty sitting down on and standing up from a chair with arms (e.g., wheelchair, bedside commode, etc,.)?: Unable Help needed moving to and from a bed to chair (including a wheelchair)?: A Lot Help needed walking in hospital room?: A Lot Help needed climbing 3-5 steps with a railing? : Total 6 Click Score: 8    End of Session Equipment Utilized During Treatment: Gait  belt Activity Tolerance: Patient limited by lethargy;Patient limited by fatigue Patient left: with call bell/phone within reach;in bed Nurse Communication: Mobility status PT Visit Diagnosis: Other abnormalities of gait and mobility (R26.89)     Time: 1535-1550 PT Time Calculation (min) (ACUTE ONLY): 15 min  Charges:  $Therapeutic Activity: 8-22 mins           Reinaldo Berber, PT, DPT Acute Rehab Services Pager: [phone]     Reinaldo Berber 09/12/2017, 3:49 PM

## 2017-09-12 NOTE — Care Management (Signed)
09/12/2017  Attending discussed SNF with pt and pt is agreeable.  CSW notified during progression meeting this am  09/11/17 Update 1:57pm:  Joann Gutierrez has declined pt with the home first program due to "unsafe for the aide with pts current level of participation".  CM discussed concerns with both CSW AD and attending - recommendations are continued efforts with SNF placements however if pt continues to refuse - HH will be arranged.  Update:  CSW and CM discussed SNF recommendation with pt along with pts significant other Ted.  Both parties listened to rationale of a safe discharge plan as pt and significant other both acknowledged that pt mobility is not at baseline.  Both parties agreed for CSW to fax out to facilities to determine SNF options     Pt refused SNF previously with CM however bedside nurse raised concerns with pt returning home due complete lack of mobility.  Pt documented "pt unsafe to return home due to requiring assistnace for all mobility and ADLs.  CSW and CM will speak with pt together regarding safe discharge plan and continue to encourage SNF at discharge.  CM also referred pt to [facility] program.

## 2017-09-12 NOTE — Progress Notes (Addendum)
16:14: CSW faxed DC summary to [facility]- patient set to go got facility. Please call report to [phone]. Patient, significant other, and sister aware of disposition plans.    Patient has been accepted to [facility] for today 8/23- patient and family agreeable to this plan. CSW notified MD for DC summary if patient medically stable for today. CSW will set up transportation once DC summary/ orders are placed.   Kingsley Spittle, [place]  [phone]

## 2017-09-12 NOTE — Plan of Care (Signed)
  Problem: Education: Goal: Knowledge of General Education information will improve Description Including pain rating scale, medication(s)/side effects and non-pharmacologic comfort measures Outcome: Adequate for Discharge   Problem: Health Behavior/Discharge Planning: Goal: Ability to manage health-related needs will improve Outcome: Adequate for Discharge   Problem: Clinical Measurements: Goal: Ability to maintain clinical measurements within normal limits will improve Outcome: Adequate for Discharge Goal: Will remain free from infection Outcome: Adequate for Discharge Goal: Diagnostic test results will improve Outcome: Adequate for Discharge Goal: Respiratory complications will improve Outcome: Adequate for Discharge Goal: Cardiovascular complication will be avoided Outcome: Adequate for Discharge   Problem: Activity: Goal: Risk for activity intolerance will decrease Outcome: Adequate for Discharge   Problem: Coping: Goal: Level of anxiety will decrease Outcome: Adequate for Discharge   Problem: Elimination: Goal: Will not experience complications related to bowel motility Outcome: Adequate for Discharge Goal: Will not experience complications related to urinary retention Outcome: Adequate for Discharge   Problem: Pain Managment: Goal: General experience of comfort will improve Outcome: Adequate for Discharge   Problem: Safety: Goal: Ability to remain free from injury will improve Outcome: Adequate for Discharge   Problem: Skin Integrity: Goal: Risk for impaired skin integrity will decrease Outcome: Adequate for Discharge   Problem: Education: Goal: Knowledge of secondary prevention will improve Outcome: Adequate for Discharge Goal: Knowledge of patient specific risk factors addressed and post discharge goals established will improve Outcome: Adequate for Discharge Goal: Individualized Educational Video(s) Outcome: Adequate for Discharge

## 2017-09-15 DIAGNOSIS — I5022 Chronic systolic (congestive) heart failure: Secondary | ICD-10-CM | POA: Diagnosis not present

## 2017-09-15 DIAGNOSIS — N183 Chronic kidney disease, stage 3 (moderate): Secondary | ICD-10-CM | POA: Diagnosis not present

## 2017-09-15 DIAGNOSIS — N179 Acute kidney failure, unspecified: Secondary | ICD-10-CM | POA: Diagnosis not present

## 2017-09-15 DIAGNOSIS — R609 Edema, unspecified: Secondary | ICD-10-CM | POA: Diagnosis not present

## 2017-09-17 ENCOUNTER — Other Ambulatory Visit: Payer: Self-pay | Admitting: *Deleted

## 2017-09-17 NOTE — Patient Outreach (Signed)
[facility]) Care Management  09/17/2017  Joann Gutierrez [birth year] [id number] Met with Joann Gutierrez. Discharge planner at facility.  She reports patient will go home at discharge.   Met with patient, son Joann Gutierrez and his fiancee at facility.  Patient sitting in wheelchair, alert and agreeable. She reports she has a lot of health problems.  She states she has heart problems, diabetes, high blood pressure, cholesterol and also has been told she has dementia.  She reports that she lives with a significant other(SO)  "Joann Gutierrez". She reports that son or SO will transport to appointments. She states that she takes her medications from a med box that is filled weekly either by her family or a home care nurse.  She states she has extra help for her medications and has no issue with affordability.  Patient reports she may need a new glucometer and does not have a BP cuff.  RNCM reviewed [facility] care management and discussed that we had recently worked with her but had a hard time keeping in touch at times. She reports she does lose her phone a lot.  RNCM left a [facility] packet. Patient will review but feels she could benefit from services upon discharge  Plan to monitor for [facility] care management needs and plan to refer for transition of care program upon discharge. Verbal consent obtained today.  Will collaborate with [facility] as indicated.   Royetta Crochet. Laymond Purser, RN, BSN, [place] [phone]) Business Cell  [phone]) Toll Free Office

## 2017-09-19 DIAGNOSIS — R0781 Pleurodynia: Secondary | ICD-10-CM | POA: Diagnosis not present

## 2017-09-19 DIAGNOSIS — N183 Chronic kidney disease, stage 3 (moderate): Secondary | ICD-10-CM | POA: Diagnosis not present

## 2017-09-19 DIAGNOSIS — J45909 Unspecified asthma, uncomplicated: Secondary | ICD-10-CM | POA: Diagnosis not present

## 2017-09-19 DIAGNOSIS — I10 Essential (primary) hypertension: Secondary | ICD-10-CM | POA: Diagnosis not present

## 2017-09-21 DIAGNOSIS — D573 Sickle-cell trait: Secondary | ICD-10-CM | POA: Diagnosis not present

## 2017-09-21 DIAGNOSIS — M6281 Muscle weakness (generalized): Secondary | ICD-10-CM | POA: Diagnosis not present

## 2017-09-21 DIAGNOSIS — E1140 Type 2 diabetes mellitus with diabetic neuropathy, unspecified: Secondary | ICD-10-CM | POA: Diagnosis not present

## 2017-09-21 DIAGNOSIS — J449 Chronic obstructive pulmonary disease, unspecified: Secondary | ICD-10-CM | POA: Diagnosis not present

## 2017-09-21 DIAGNOSIS — Z794 Long term (current) use of insulin: Secondary | ICD-10-CM | POA: Diagnosis not present

## 2017-09-21 DIAGNOSIS — I2510 Atherosclerotic heart disease of native coronary artery without angina pectoris: Secondary | ICD-10-CM | POA: Diagnosis not present

## 2017-09-21 DIAGNOSIS — R1084 Generalized abdominal pain: Secondary | ICD-10-CM | POA: Diagnosis not present

## 2017-09-21 DIAGNOSIS — J309 Allergic rhinitis, unspecified: Secondary | ICD-10-CM | POA: Diagnosis not present

## 2017-09-21 DIAGNOSIS — K219 Gastro-esophageal reflux disease without esophagitis: Secondary | ICD-10-CM | POA: Diagnosis not present

## 2017-09-21 DIAGNOSIS — E785 Hyperlipidemia, unspecified: Secondary | ICD-10-CM | POA: Diagnosis not present

## 2017-09-21 DIAGNOSIS — R609 Edema, unspecified: Secondary | ICD-10-CM | POA: Diagnosis not present

## 2017-09-21 DIAGNOSIS — R0602 Shortness of breath: Secondary | ICD-10-CM | POA: Diagnosis not present

## 2017-09-21 DIAGNOSIS — I10 Essential (primary) hypertension: Secondary | ICD-10-CM | POA: Diagnosis not present

## 2017-09-21 DIAGNOSIS — E1142 Type 2 diabetes mellitus with diabetic polyneuropathy: Secondary | ICD-10-CM | POA: Diagnosis not present

## 2017-09-21 DIAGNOSIS — E162 Hypoglycemia, unspecified: Secondary | ICD-10-CM | POA: Diagnosis not present

## 2017-09-21 DIAGNOSIS — D631 Anemia in chronic kidney disease: Secondary | ICD-10-CM | POA: Diagnosis present

## 2017-09-21 DIAGNOSIS — G4733 Obstructive sleep apnea (adult) (pediatric): Secondary | ICD-10-CM | POA: Diagnosis not present

## 2017-09-21 DIAGNOSIS — N183 Chronic kidney disease, stage 3 (moderate): Secondary | ICD-10-CM | POA: Diagnosis present

## 2017-09-21 DIAGNOSIS — R079 Chest pain, unspecified: Secondary | ICD-10-CM | POA: Diagnosis not present

## 2017-09-21 DIAGNOSIS — I5022 Chronic systolic (congestive) heart failure: Secondary | ICD-10-CM | POA: Diagnosis not present

## 2017-09-21 DIAGNOSIS — I200 Unstable angina: Secondary | ICD-10-CM | POA: Diagnosis not present

## 2017-09-21 DIAGNOSIS — K3184 Gastroparesis: Secondary | ICD-10-CM | POA: Diagnosis not present

## 2017-09-23 ENCOUNTER — Encounter (HOSPITAL_COMMUNITY): Payer: Medicare Other

## 2017-09-24 ENCOUNTER — Other Ambulatory Visit: Payer: Self-pay | Admitting: *Deleted

## 2017-09-24 DIAGNOSIS — R609 Edema, unspecified: Secondary | ICD-10-CM | POA: Diagnosis not present

## 2017-09-24 DIAGNOSIS — E1140 Type 2 diabetes mellitus with diabetic neuropathy, unspecified: Secondary | ICD-10-CM | POA: Diagnosis not present

## 2017-09-24 DIAGNOSIS — I5022 Chronic systolic (congestive) heart failure: Secondary | ICD-10-CM | POA: Diagnosis not present

## 2017-09-24 DIAGNOSIS — N183 Chronic kidney disease, stage 3 (moderate): Secondary | ICD-10-CM | POA: Diagnosis not present

## 2017-09-24 DIAGNOSIS — E162 Hypoglycemia, unspecified: Secondary | ICD-10-CM | POA: Diagnosis not present

## 2017-09-24 NOTE — Patient Outreach (Signed)
[facility] ([facility]) Care Management  09/24/2017  Joann Gutierrez 01/28/1945 [id number]  Met with Joann Gutierrez, discharge planner at facility. She reports no discharge date as of yet.   Met with patient and her friend Joann Gutierrez. She reports she is not sure when she is going home.  She was able to further review [facility] packet and agrees for referral to [facility] RNCM community for transition of care upon discharge from facility. She wants to go home soon and be in the best shape she can be in. Her friend states she had gotten very deconditioned and therapy at the facility has been very beneficial.   Patient gives verbal consent to speak with her friend Joann Gutierrez, as she states he lives with her and knows a lot about her health issues.  He reports home phone number as [phone] and his cell is [phone].   Plan to make referral to [facility] RNCM community for transition of care.   Will collaborate with [facility] as indicated.  E. , RN, BSN, CCM  Post Acute Care Coordinator [facility] ([phone]) Business Cell  ([phone]) Toll Free Office  

## 2017-09-25 ENCOUNTER — Encounter (HOSPITAL_COMMUNITY)
Admission: RE | Admit: 2017-09-25 | Discharge: 2017-09-25 | Disposition: A | Discharge status: Home / Self Care | Payer: Medicare Other | Source: Ambulatory Visit | Attending: Nephrology | Admitting: Nephrology

## 2017-09-25 VITALS — BP 173/75 | HR 80 | Temp 97.9°F | Resp 18

## 2017-09-25 DIAGNOSIS — N183 Chronic kidney disease, stage 3 unspecified: Secondary | ICD-10-CM

## 2017-09-25 DIAGNOSIS — D631 Anemia in chronic kidney disease: Secondary | ICD-10-CM | POA: Diagnosis not present

## 2017-09-25 LAB — RENAL FUNCTION PANEL
Albumin: 3.1 g/dL — ABNORMAL LOW (ref 3.5–5.0)
Anion gap: 10 (ref 5–15)
BUN: 35 mg/dL — ABNORMAL HIGH (ref 8–23)
CO2: 28 mmol/L (ref 22–32)
CREATININE: 1.97 mg/dL — AB (ref 0.44–1.00)
Calcium: 8.3 mg/dL — ABNORMAL LOW (ref 8.9–10.3)
Chloride: 107 mmol/L (ref 98–111)
GFR calc non Af Amer: 24 mL/min — ABNORMAL LOW (ref >60)
GFR, EST AFRICAN AMERICAN: 28 mL/min — AB (ref >60)
Glucose, Bld: 134 mg/dL — ABNORMAL HIGH (ref 70–99)
Phosphorus: 3.6 mg/dL (ref 2.5–4.6)
Potassium: 3.9 mmol/L (ref 3.5–5.1)
Sodium: 145 mmol/L (ref 135–145)

## 2017-09-25 LAB — IRON AND TIBC
Iron: 100 ug/dL (ref 28–170)
Saturation Ratios: 44 % — ABNORMAL HIGH (ref 10.4–31.8)
TIBC: 230 ug/dL — ABNORMAL LOW (ref 250–450)
UIBC: 130 ug/dL

## 2017-09-25 LAB — POCT HEMOGLOBIN-HEMACUE: Hemoglobin: 9.5 g/dL — ABNORMAL LOW (ref 12.0–15.0)

## 2017-09-25 LAB — FERRITIN: FERRITIN: 479 ng/mL — AB (ref 11–307)

## 2017-09-25 MED ORDER — EPOETIN ALFA 40000 UNIT/ML IJ SOLN: 30000.0000 [IU] | INTRAMUSCULAR | Status: DC

## 2017-09-25 MED ORDER — EPOETIN ALFA 10000 UNIT/ML IJ SOLN
INTRAMUSCULAR | Status: AC
  Administered 2017-09-25: 10000 [IU]
  Filled 2017-09-25: qty 1

## 2017-09-25 MED ORDER — EPOETIN ALFA 20000 UNIT/ML IJ SOLN
INTRAMUSCULAR | Status: AC
  Administered 2017-09-25: 20000 [IU]
  Filled 2017-09-25: qty 1

## 2017-09-26 ENCOUNTER — Ambulatory Visit: Payer: Medicare Other | Admitting: Internal Medicine

## 2017-09-26 DIAGNOSIS — N183 Chronic kidney disease, stage 3 (moderate): Secondary | ICD-10-CM | POA: Diagnosis not present

## 2017-09-26 DIAGNOSIS — I10 Essential (primary) hypertension: Secondary | ICD-10-CM | POA: Diagnosis not present

## 2017-09-26 DIAGNOSIS — I5022 Chronic systolic (congestive) heart failure: Secondary | ICD-10-CM | POA: Diagnosis not present

## 2017-09-26 LAB — PTH, INTACT AND CALCIUM
Calcium, Total (PTH): 8.2 mg/dL — ABNORMAL LOW (ref 8.7–10.3)
PTH: 116 pg/mL — ABNORMAL HIGH (ref 15–65)

## 2017-09-29 DIAGNOSIS — N183 Chronic kidney disease, stage 3 (moderate): Secondary | ICD-10-CM | POA: Diagnosis not present

## 2017-09-29 DIAGNOSIS — I5022 Chronic systolic (congestive) heart failure: Secondary | ICD-10-CM | POA: Diagnosis not present

## 2017-09-29 DIAGNOSIS — R609 Edema, unspecified: Secondary | ICD-10-CM | POA: Diagnosis not present

## 2017-09-29 DIAGNOSIS — R0602 Shortness of breath: Secondary | ICD-10-CM | POA: Diagnosis not present

## 2017-09-29 DIAGNOSIS — E1142 Type 2 diabetes mellitus with diabetic polyneuropathy: Secondary | ICD-10-CM | POA: Diagnosis not present

## 2017-10-02 DIAGNOSIS — M6281 Muscle weakness (generalized): Secondary | ICD-10-CM | POA: Diagnosis not present

## 2017-10-02 DIAGNOSIS — I2510 Atherosclerotic heart disease of native coronary artery without angina pectoris: Secondary | ICD-10-CM | POA: Diagnosis not present

## 2017-10-02 DIAGNOSIS — J449 Chronic obstructive pulmonary disease, unspecified: Secondary | ICD-10-CM | POA: Diagnosis not present

## 2017-10-02 DIAGNOSIS — K3184 Gastroparesis: Secondary | ICD-10-CM | POA: Diagnosis not present

## 2017-10-02 DIAGNOSIS — E1142 Type 2 diabetes mellitus with diabetic polyneuropathy: Secondary | ICD-10-CM | POA: Diagnosis not present

## 2017-10-06 ENCOUNTER — Ambulatory Visit (INDEPENDENT_AMBULATORY_CARE_PROVIDER_SITE_OTHER)
Admission: RE | Admit: 2017-10-06 | Discharge: 2017-10-06 | Disposition: A | Discharge status: Home / Self Care | Payer: Medicare Other | Source: Ambulatory Visit | Attending: Internal Medicine | Admitting: Internal Medicine

## 2017-10-06 ENCOUNTER — Encounter: Payer: Self-pay | Admitting: Internal Medicine

## 2017-10-06 ENCOUNTER — Ambulatory Visit (INDEPENDENT_AMBULATORY_CARE_PROVIDER_SITE_OTHER): Payer: Medicare Other | Admitting: Internal Medicine

## 2017-10-06 VITALS — BP 146/92 | HR 73 | Ht 64.5 in | Wt 266.2 lb

## 2017-10-06 DIAGNOSIS — I10 Essential (primary) hypertension: Secondary | ICD-10-CM | POA: Diagnosis not present

## 2017-10-06 DIAGNOSIS — Z7951 Long term (current) use of inhaled steroids: Secondary | ICD-10-CM | POA: Diagnosis not present

## 2017-10-06 DIAGNOSIS — E1143 Type 2 diabetes mellitus with diabetic autonomic (poly)neuropathy: Secondary | ICD-10-CM | POA: Diagnosis not present

## 2017-10-06 DIAGNOSIS — I2510 Atherosclerotic heart disease of native coronary artery without angina pectoris: Secondary | ICD-10-CM | POA: Diagnosis not present

## 2017-10-06 DIAGNOSIS — I700 Atherosclerosis of aorta: Secondary | ICD-10-CM | POA: Diagnosis not present

## 2017-10-06 DIAGNOSIS — E1122 Type 2 diabetes mellitus with diabetic chronic kidney disease: Secondary | ICD-10-CM | POA: Diagnosis not present

## 2017-10-06 DIAGNOSIS — M1991 Primary osteoarthritis, unspecified site: Secondary | ICD-10-CM | POA: Diagnosis not present

## 2017-10-06 DIAGNOSIS — Z7982 Long term (current) use of aspirin: Secondary | ICD-10-CM | POA: Diagnosis not present

## 2017-10-06 DIAGNOSIS — Z9981 Dependence on supplemental oxygen: Secondary | ICD-10-CM | POA: Diagnosis not present

## 2017-10-06 DIAGNOSIS — K3184 Gastroparesis: Secondary | ICD-10-CM | POA: Diagnosis not present

## 2017-10-06 DIAGNOSIS — E1142 Type 2 diabetes mellitus with diabetic polyneuropathy: Secondary | ICD-10-CM | POA: Diagnosis not present

## 2017-10-06 DIAGNOSIS — J449 Chronic obstructive pulmonary disease, unspecified: Secondary | ICD-10-CM

## 2017-10-06 DIAGNOSIS — Z794 Long term (current) use of insulin: Secondary | ICD-10-CM | POA: Diagnosis not present

## 2017-10-06 DIAGNOSIS — G4733 Obstructive sleep apnea (adult) (pediatric): Secondary | ICD-10-CM | POA: Diagnosis not present

## 2017-10-06 DIAGNOSIS — E785 Hyperlipidemia, unspecified: Secondary | ICD-10-CM | POA: Diagnosis not present

## 2017-10-06 DIAGNOSIS — D631 Anemia in chronic kidney disease: Secondary | ICD-10-CM | POA: Diagnosis not present

## 2017-10-06 DIAGNOSIS — J9811 Atelectasis: Secondary | ICD-10-CM | POA: Diagnosis not present

## 2017-10-06 DIAGNOSIS — K219 Gastro-esophageal reflux disease without esophagitis: Secondary | ICD-10-CM | POA: Diagnosis not present

## 2017-10-06 DIAGNOSIS — D573 Sickle-cell trait: Secondary | ICD-10-CM | POA: Diagnosis not present

## 2017-10-06 DIAGNOSIS — I130 Hypertensive heart and chronic kidney disease with heart failure and stage 1 through stage 4 chronic kidney disease, or unspecified chronic kidney disease: Secondary | ICD-10-CM | POA: Diagnosis not present

## 2017-10-06 DIAGNOSIS — R05 Cough: Secondary | ICD-10-CM | POA: Diagnosis not present

## 2017-10-06 DIAGNOSIS — R569 Unspecified convulsions: Secondary | ICD-10-CM | POA: Diagnosis not present

## 2017-10-06 DIAGNOSIS — F329 Major depressive disorder, single episode, unspecified: Secondary | ICD-10-CM | POA: Diagnosis not present

## 2017-10-06 DIAGNOSIS — I5022 Chronic systolic (congestive) heart failure: Secondary | ICD-10-CM | POA: Diagnosis not present

## 2017-10-06 DIAGNOSIS — N183 Chronic kidney disease, stage 3 (moderate): Secondary | ICD-10-CM | POA: Diagnosis not present

## 2017-10-06 DIAGNOSIS — K5790 Diverticulosis of intestine, part unspecified, without perforation or abscess without bleeding: Secondary | ICD-10-CM | POA: Diagnosis not present

## 2017-10-06 MED ORDER — BISOPROLOL FUMARATE 5 MG PO TABS: 5.0000 mg | ORAL_TABLET | Freq: Every day | ORAL | 11 refills | Status: DC

## 2017-10-06 NOTE — Progress Notes (Signed)
Subjective:    Patient ID: Joann Gutierrez, female    DOB: May 24, 1945    MRN: [id number]    Brief patient profile:  72 yobf remote smoker - quit 03/1980 with  MO, asthma, OSA, OHS, and rt hemidiaphragm elevation first noted November 2011.     TESTS: PSG 04/28/09 >> AHI 11, SpO2 low 68% Spirometry 07/05/10 >> FEV1 1.30 (64%), FEV1% 80 >> coughing during test Echo 05/21/11 >> EF 55 to 60%, mild LVH, mild/mod TR, PAS 45 mmHg ONO with CPAP and room air 10/22/12 >> Test time 10 hrs 59 min. Mean SpO2 92.4%, low SpO2 75%. Spent 16 min with SpO2 < 89%. RAST 12/11/12 >> negative, IgE 63.3 Echo 02/04/13 >> mod LVH, EF 65 to 82%, grade 1 diastolic dysfx, PAS 42 mmHg Lt, Rt Heart cath 03/03/13 >> RA 10, RV 38/7, PA 41/16 28, PCWP 13, LV 142/5, AO 137/76 PFT 03/17/13 >> FEV1 1.27 (67%), FEV1% 84, TLC 4.06 (80%), DLCO 57%, borderline BD response      05/20/2014 acute  ov/Damere Brandenburg re: aeAB  Chief Complaint  Patient presents with  . Acute Visit    Pt c/o cough, wheezing, increased SOB and chest tightness for the past 5 days. Cough is prod with moderate green sputum. She is using albuterol inhaler 2 x daily on average.  normally uses nebulizer twice daily but has not worked for a month "making an odd sound" Thoroughly confused with meds/ names/ timing maint vs prn and worse at hs despite cpap. Still comfortable at rest even s saba  rec Augmentin 875 mg take one pill twice daily  X 10 days - take at breakfast and supper with large glass of water.  It would help reduce the usual side effects (diarrhea and yeast infections) if you ate cultured yogurt at lunch.  Prednisone 10 mg take  4 each am x 2 days,   2 each am x 2 days,  1 each am x 2 days and stop If needed nebulizer up to every 4 hours if needed for breathlessness  For cough use flutter valve      09/22/2014 f/u ov/Cletis Clack re: uacs  /gastroparesis related ?  With no improvement in sob since last eval  Chief Complaint  Patient presents with  . Acute  Visit    Pt c/o increased SOB "since the last time I was here". She also c/o difficulty swallowing and getting choked on food for the past month.   all  symptoms resolve with cpap day or noct but doe is persistent daily then choking worse x one month Not at all clear what meds she takes / was on reglan not clear when she stopped it rec metaclopromide 10 mg (reglan) is 10 mg before meals and at bedtime Omeprazole 20 mg Take 30- 60 min before your first and last meals of the day  GERD diet     07/13/2015  f/u ov/[name] re: AB vs uacs / obesity / deconditioning  Chief Complaint  Patient presents with  . Follow-up    Breathing is "doing a little bit better"- no new co's.  She uses albuterol 2 x daily on average.   MMRC3 = can't walk 100 yards even at a slow pace at a flat grade s stopping due to sob  - can't do [employer] consistently   Very confused with meds  rec No change in medications/ keep working on weight loss   10/31/16 Sood ov  rec Can use mucinex twice per  day as needed to help loosen phlegm in your chest Dulera two puffs twice per day and rinse mouth after each use Singulair 10 mg pill nightly Ventolin two puffs every 6 hours as needed for cough, wheeze, or chest congestion      02/06/2017 acute extended ov/[name] re:  Acute cough / pt with ? Severity asthma and very poor baseline hfa Chief Complaint  Patient presents with  . Acute Visit    productive cough,green,white,thick mucus,SOB w/ activity,headaches,chest,nasal congestion,sore throat,has noticed feet/ankle swelling,has taken mucinex,robitussin DM and has not felt better  best days can do [employer] leaing on cart/ freq stop  = MMRC3 = can't walk 100 yards even at a slow pace at a flat grade s stopping due to sob   - ok resting sitting/ lie down R side at 30 degrees  Cough immediately worse lying down - also on 02 at hs 2lpm  Much worse x 5 days with now green mucu prod esp in am and sore throat/ still not sob at rest  though sitting rec Prednisone Take 4 for three days 3 for three days 2 for three days 1 for three days and stop   Plan A = Automatic = Dulera 100 Take 2 puffs first thing in am and then another 2 puffs about 12 hours later.  Plan B = Backup Only use your albuterol as a rescue medication Plan C = Crisis - only use your albuterol nebulizer if you first try Plan B and it fails to help > ok to use the nebulizer up to every 4 hours but if start needing it regularly call for immediate appointment Work on inhaler technique:  relax and gently blow all the way out then take a nice smooth deep breath back in, triggering the inhaler at same time you start breathing in.  Hold for up to 5 seconds if you can. Blow out thru nose. Rinse and gargle with water when done If not able to do better with inhalers may need nebulizer next     10/06/2017  Acute  ov/[name] re: sob / cough/ totally confused with meds ? On normodyne and coreg? Chief Complaint  Patient presents with  . Acute Visit    c/o worsening sob, prod cough with green mucus, chest tightness X4 days.    Dyspnea:  MMRC4  = sob if tries to leave home or while getting dressed   Cough: acutely worse x 4 days with subj wheezing Sleeping: 30 degrees lots of cough  SABA use: has not used on day 02: 2lpm prn      No obvious day to day or daytime variability or assoc   mucus plugs or hemoptysis or cp or chest tightness, subjective wheeze or overt sinus or hb symptoms.    Also denies any obvious fluctuation of symptoms with weather or environmental changes or other aggravating or alleviating factors except as outlined above   No unusual exposure hx or h/o childhood pna/ asthma or knowledge of premature birth.  Current Allergies, Complete Past Medical History, Past Surgical History, Family History, and Social History were reviewed in [employer] record.  ROS  The following are not active complaints unless bolded Hoarseness, sore  throat, dysphagia, dental problems, itching, sneezing,  nasal congestion or discharge of excess mucus or purulent secretions, ear ache,   fever, chills, sweats, unintended wt loss or wt gain, classically pleuritic or exertional cp,  orthopnea pnd or arm/hand swelling  or leg swelling, presyncope, palpitations, abdominal  pain, anorexia, nausea, vomiting, diarrhea  or change in bowel habits or change in bladder habits, change in stools or change in urine, dysuria, hematuria,  rash, arthralgias, visual complaints, headache, numbness, weakness or ataxia or problems with walking or coordination,  change in mood or  memory.        Current Meds  Medication Sig  . albuterol (PROVENTIL HFA;VENTOLIN HFA) 108 (90 Base) MCG/ACT inhaler Inhale 2 puffs into the lungs every 6 (six) hours as needed for wheezing or shortness of breath.  Marland Kitchen aspirin EC 81 MG tablet Take 81 mg by mouth daily.  . calcium carbonate (OSCAL) 1500 (600 Ca) MG TABS tablet Take 600 mg of elemental calcium by mouth daily with breakfast.  . ezetimibe (ZETIA) 10 MG tablet Take 10 mg by mouth daily.   . fluticasone (FLONASE) 50 MCG/ACT nasal spray USE TWO SPRAYS INTO BOTH NOSTRILS DAILY  . fluticasone furoate-vilanterol (BREO ELLIPTA) 100-25 MCG/INH AEPB Inhale 1 puff into the lungs daily.  . furosemide (LASIX) 80 MG tablet Take 1 tablet (80 mg total) by mouth daily. (Patient taking differently: Take 80 mg by mouth 2 (two) times daily. )  . gabapentin (NEURONTIN) 100 MG capsule Take 1 capsule (100 mg total) by mouth 2 (two) times daily.  Marland Kitchen guaiFENesin (MUCINEX) 600 MG 12 hr tablet Take 2 tablets (1,200 mg total) by mouth 2 (two) times daily as needed for cough or to loosen phlegm.  . insulin aspart (NOVOLOG) 100 UNIT/ML injection Inject 0-9 Units into the skin 3 (three) times daily with meals. CBG < 70: implement hypoglycemia protocol CBG 70 - 120: 0 units CBG 121 - 150: 1 unit CBG 151 - 200: 2 units CBG 201 - 250: 3 units CBG 251 - 300: 5  units CBG 301 - 350: 7 units CBG 351 - 400: 9 units CBG > 400: call MD.  . insulin aspart (NOVOLOG) 100 UNIT/ML injection Inject 5 Units into the skin 3 (three) times daily with meals.  Marland Kitchen LEVEMIR FLEXTOUCH 100 UNIT/ML Pen Inject 18 Units into the skin daily.  . meclizine (ANTIVERT) 25 MG tablet Take 1 tablet (25 mg total) by mouth 3 (three) times daily as needed for dizziness.  . montelukast (SINGULAIR) 10 MG tablet TAKE ONE TABLET BY MOUTH ONCE DAILY (Patient taking differently: TAKE ONE TABLET BY MOUTH AT BEDTIME)  . nitroGLYCERIN (NITROSTAT) 0.4 MG SL tablet PLACE ONE TABLET UNDER THE TONGUE EVERY FIVE MINUTES AS NEEDED FOR CHEST PAIN  . OXYGEN Inhale 2 L into the lungs continuous. CPAP with oxygen at bedtime   . raloxifene (EVISTA) 60 MG tablet Take 60 mg by mouth every morning.   . TRUEPLUS PEN NEEDLES 32G X 4 MM MISC INJECT THREE TIMES A DAY AS DIRECTED  . Vitamin D, Ergocalciferol, (DRISDOL) 50000 UNITS CAPS capsule Take 50,000 Units by mouth every Monday.   . [ ]  carvedilol (COREG) 12.5 MG tablet Take 12.5 mg by mouth 2 (two) times daily with a meal.         Objective:   Physical Exam  amb obese bf nad at rest but struggles to get out of chair and can't do exam table at all even with assistance  09/22/2014          288 >  07/13/2015   279 > 02/06/2017  259 > 10/06/2017  266     05/20/14 278 lb (126.1 kg)  04/06/14 267 lb (121.11 kg)  03/04/14 267 lb 12.8 oz (121.473 kg)  Vital signs reviewed - Note on arrival 02 sats  97% on RA        HEENT: nl   turbinates bilaterally, and oropharynx. Nl external ear canals without cough reflex/ top denture    NECK :  without JVD/Nodes/TM/ nl carotid upstrokes bilaterally   LUNGS: no acc muscle use,  Nl contour chest  With upper and lower airway wheezing  CV:  RRR  no s3 or murmur or increase in P2, and no edema   ABD:  Obese soft and nontender with limited inspiratory excursion . No bruits or organomegaly appreciated, bowel sounds  nl  MS:  Very slow gait/  ext warm without deformities, calf tenderness, cyanosis or clubbing No obvious joint restrictions   SKIN: warm and dry without lesions    NEURO:  alert,   with  no motor or cerebellar deficits apparent/ very easily confused with questions re details of care     CXR PA and Lateral:   10/06/2017 :    I personally reviewed images and agree with radiology impression as follows:    Right posterior basal atelectasis or pneumonia which has become somewhat less conspicuous than on the previous study. If it appears clinically that the patient's former symptoms have recurred, a repeat course of antibiotics may be indicated. A follow-up chest x-ray in 2-3 weeks is recommended.    I personally reviewed images and agree with radiology impression as follows:  CT head 09/08/17 Trace layering opacity within the left sphenoid sinus. Paranasal sinuses otherwise clear.           Assessment & Plan:

## 2017-10-06 NOTE — Patient Instructions (Addendum)
Stop normodyne (labetolol)  Or coreg/corevidol (both of these block the benefit of your albuterol inhaler and nebulizer   Start bisoprolol 5 mg daily   Please remember to go to the lab and x-ray department downstairs in the basement  for your tests - we will call you with the results when they are available.     Please schedule a follow up office visit in1 week to see Aaron Edelman NP , call sooner if needed with all medications /inhalers/ solutions in hand so we can verify exactly what you are taking. This includes all medications from all doctors and over the counters - without this step we cannot provide you quality medical care.

## 2017-10-07 ENCOUNTER — Encounter: Payer: Self-pay | Admitting: Internal Medicine

## 2017-10-07 ENCOUNTER — Ambulatory Visit: Payer: Medicare Other | Admitting: Physician Assistant

## 2017-10-07 ENCOUNTER — Other Ambulatory Visit: Payer: Self-pay | Admitting: *Deleted

## 2017-10-07 MED ORDER — AMOXICILLIN-POT CLAVULANATE 875-125 MG PO TABS: 1.0000 | ORAL_TABLET | Freq: Two times a day (BID) | ORAL | 0 refills | Status: AC

## 2017-10-07 NOTE — Assessment & Plan Note (Signed)
In the setting of respiratory symptoms of unknown etiology,  It would be preferable to use bystolic, the most beta -1  selective Beta blocker available in sample form, with bisoprolol the most selective generic choice  on the market, at least on a trial basis, to make sure the spillover Beta 2 effects of the less specific Beta blockers are not contributing to this patient's symptoms.   Try bisoprolol 5 mg daily , husband will check and increase to bid if needed

## 2017-10-07 NOTE — Assessment & Plan Note (Addendum)
Spirometry 07/05/10 >> FEV1 1.30 (64%), FEV1% 80  - Spirometry 02/06/2017  FEV1 0.88 (49%)  Ratio 63 with f/v loop non-physiologic but min curvature in effort indep portion  - 02/06/2017  After extensive coaching inhaler device  effectiveness =    25% from a basline of 0    DDX of  difficult airways management almost all start with A and  include Adherence, Ace Inhibitors, Acid Reflux, Active Sinus Disease, Alpha 1 Antitripsin deficiency, Anxiety masquerading as Airways dz,  ABPA,  Allergy(esp in young), Aspiration (esp in elderly), Adverse effects of meds,  Active smokers, A bunch of PE's (a small clot burden can't cause this syndrome unless there is already severe underlying pulm or vascular dz with poor reserve) plus two Bs  = Bronchiectasis and Beta blocker use..and one C= CHF  Adherence is always the initial "prime suspect" and is a multilayered concern that requires a "trust but verify" approach in every patient - starting with knowing how to use medications, especially inhalers, correctly, keeping up with refills and understanding the fundamental difference between maintenance and prns vs those medications only taken for a very short course and then stopped and not refilled.  - return with all meds in hand using a trust but verify approach to confirm accurate Medication  Reconciliation The principal here is that until we are certain that the  patients are doing what we've asked, it makes no sense to ask them to do more.  - since can't use hfa effectively just use neb for now.  ? Acid (or non-acid) GERD > always difficult to exclude as up to 75% of pts in some series report no assoc GI/ Heartburn symptoms> rec consider adding max gerd rx if not better off BB  ? Active sinus dz > see sinus ct 09/08/17 > rx Augmentin 875 mg take one pill twice daily  X 10 days   ? Allergy > continue singulair   ? Adverse effects of dpi > ?still taking dpi > just use neb for now   ? BB effects : not really clear  which ones she's taking now > change to bisprolol (see separate a/p)       I had an extended discussion with the patient reviewing all relevant studies completed to date and  lasting 15 to 20 minutes of a 25 minute acute office  visit    Each maintenance medication was reviewed in detail including most importantly the difference between maintenance and prns and under what circumstances the prns are to be triggered using an action plan format that is not reflected in the computer generated alphabetically organized AVS.     Please see AVS for specific instructions unique to this visit that I personally wrote and verbalized to the the pt in detail and then reviewed with pt  by my nurse highlighting any  changes in therapy recommended at today's visit to their plan of care.

## 2017-10-07 NOTE — Assessment & Plan Note (Signed)
Body mass index is 44.99 kg/m.  -  trending up  Lab Results  Component Value Date   TSH 2.748 09/05/2017     Contributing to gerd risk/ doe/reviewed the need and the process to achieve and maintain neg calorie balance > defer f/u primary care including intermittently monitoring thyroid status

## 2017-10-07 NOTE — Patient Outreach (Signed)
[facility]) Care Management  10/07/2017  PRICILA BRIDGE Sep 13, 1945 [id number]   Referral received from post acute care coordinator as member was recently admitted to hospital 8/15-9/23 with chest pain, discharged to SNF for rehab, discharged from there on 9/12.  Per chart, she has history of hypertension, asthma, GERD, diabetes (A1C greater then 11), chronic kidney disease, and hypercholesterolemia.    Call placed to member tor transition of care, no answer, HIPAA compliant voice message left.  Call then placed to member's friend, [name].  He report member is doing well, has [facility] involved for PT and nursing.  She had follow up appointment with pulmonologist on 9/16, received prescriptions for new medications, has not picked them up yet but will today.  Does not have follow up with primary MD yet but will call to schedule.    [name] monitors blood pressure daily, member monitors her own blood sugar daily.  Discussed proper diabetes and low sodium diet and he is the cook in the home.  Denies any urgent concerns at this time, agrees to home visit within the next 2 weeks, will follow up next week telephonically.  [facility] CM Care Plan Problem One     Most Recent Value  Care Plan Problem One  Risk for readmission as evidenced by recent hospitalization requiring SNF stay.  Role Documenting the Problem One  Care Management [place] for Problem One  Active  [facility] Long Term Goal   Patient will have no acute care admission for the next 31 days.  [facility] Long Term Goal Start Date  10/07/17  Interventions for Problem One Long Term Goal  Discussed with significant other the importance of following discharge instructions, including follow up appointments, medications, diet, and home health involvement, to decrease the risk of readmission  [facility] CM Short Term Goal #1   Member will have follow up appointment with primary MD within the next 4 weeks  [facility] CM Short Term Goal #1  Start Date  10/07/17  Interventions for Short Term Goal #1  Advised significant other of importance of follow up with primary MD in effort to manage chronic health conditions  [facility] CM Short Term Goal #2   Member will report taking medications as instructed over the next 4 weeks  [facility] CM Short Term Goal #2 Start Date  10/07/17  Interventions for Short Term Goal #2  Medication list reviewed with family, advised to obtain all medications from pharmacy as soon as possible.     [name], [place], MSN [place] [phone]

## 2017-10-07 NOTE — Progress Notes (Deleted)
Cardiology Office Note   Date:  10/07/2017   ID:  Joann Gutierrez, DOB August 19, 1945, MRN [id number]  PCP:  Seward Carol, MD  Cardiologist:  Dr Percival Spanish (2015)  Rosaria Ferries, PA-C   No chief complaint on file.   History of Present Illness: Joann Gutierrez is a 72 y.o. female with a history of COPD not on home O2, HTN, CKD III baseline Cr 1.8, dCHF, OSA, DM, chronic anemia, gastroparesis, CVA, Sz, minimal CAD at cath 2015and chronic widespread pain  Admitted 08/15-08/23/2019 for hypotension, Acute on chronic CKD, hyperkalemia, abd pain, elevated trop w/ nl EF, ?HCM  Joann Gutierrez presents for ***   Past Medical History:  Diagnosis Date  . Adenomatous colon polyp   . Allergy   . Anemia   . Asthma       . CAD (coronary artery disease)    Mild very minimal coronary disease with 20% obtuse marginal stenosis  . Carpal tunnel syndrome on left   . CHF (congestive heart failure) ([place])   . Chronic kidney disease (CKD), stage III (moderate) (HCC)   . COPD (chronic obstructive pulmonary disease) ([place])   . CVA (cerebral infarction)   . Depression   . 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 Diverticulosis   . 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 Esophageal dysmotility   . Esophageal stricture   . Gastritis   . Gastroparesis 08/21/2007  . GERD (gastroesophageal reflux disease)   . Hair loss   . Hearing loss of both ears   . Hernia, hiatal   . Hyperlipidemia   . Hypertension   . Morbid obesity ([place])   . Obesity   . Osteoarthritis   . OSTEOARTHRITIS 08/09/2006  . Osteoporosis   . 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 RENAL INSUFFICIENCY 02/16/2009  . 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 Seizures ([place])    pt thinks it has been several monthes since she had a seisure  . Shingles   . Sickle cell trait ([place])   . Stroke ([place])   . Tubular adenoma of colon     Past Surgical History:  Procedure Laterality Date  . ABDOMINAL HYSTERECTOMY     . ARTERY BIOPSY  01/07/2011   Procedure: MINOR BIOPSY TEMPORAL ARTERY;  Surgeon: Haywood Lasso, MD;  Location: [facility];  Service: General;  Laterality: Left;  left temporal artery biopsy  . bil foot surgery    . BREAST LUMPECTOMY     benign  . BREAST LUMPECTOMY     both breast lumps removed   . CATARACT EXTRACTION Left 06/2016   Dr. Read Drivers  . COLONOSCOPY    . COLONOSCOPY WITH PROPOFOL N/A 07/05/2015   Procedure: COLONOSCOPY WITH PROPOFOL;  Surgeon: Manus Gunning, MD;  Location: [facility];  Service: Gastroenterology;  Laterality: N/A;  . ERD  08/08/2000  . ESOPHAGEAL MANOMETRY N/A 03/13/2015   Procedure: ESOPHAGEAL MANOMETRY (EM);  Surgeon: Manus Gunning, MD;  Location: [facility];  Service: Gastroenterology;  Laterality: N/A;  . ESOPHAGOGASTRODUODENOSCOPY  06/25/2006  . ESOPHAGOGASTRODUODENOSCOPY (EGD) WITH PROPOFOL N/A 07/05/2015   Procedure: ESOPHAGOGASTRODUODENOSCOPY (EGD) WITH PROPOFOL;  Surgeon: Manus Gunning, MD;  Location: [facility];  Service: Gastroenterology;  Laterality: N/A;  . HERNIA REPAIR    . LEFT AND RIGHT HEART CATHETERIZATION WITH CORONARY ANGIOGRAM N/A 03/03/2013   Procedure: LEFT AND RIGHT HEART CATHETERIZATION WITH CORONARY ANGIOGRAM;  Surgeon: Minus Breeding, MD;  Location: [facility];  Service: Cardiovascular;  Laterality: N/A;  . REPLACEMENT TOTAL KNEE Left 1998  . UPPER GASTROINTESTINAL ENDOSCOPY      Current Outpatient Medications  Medication Sig Dispense Refill  . albuterol (PROVENTIL HFA;VENTOLIN HFA) 108 (90 Base) MCG/ACT inhaler Inhale 2 puffs into the lungs every 6 (six) hours as needed for wheezing or shortness of breath. 1 Inhaler 6  . amoxicillin-clavulanate (AUGMENTIN) 875-125 MG tablet Take 1 tablet by mouth 2 (two) times daily for 10 days. 20 tablet 0  . aspirin EC 81 MG tablet Take 81 mg by mouth daily.    . bisoprolol (ZEBETA) 5 MG tablet Take 1 tablet (5 mg total) by mouth daily. 30 tablet  11  . calcium carbonate (OSCAL) 1500 (600 Ca) MG TABS tablet Take 600 mg of elemental calcium by mouth daily with breakfast.    . ezetimibe (ZETIA) 10 MG tablet Take 10 mg by mouth daily.     . fluticasone (FLONASE) 50 MCG/ACT nasal spray USE TWO SPRAYS INTO BOTH NOSTRILS DAILY 16 g 3  . fluticasone furoate-vilanterol (BREO ELLIPTA) 100-25 MCG/INH AEPB Inhale 1 puff into the lungs daily. 30 each 5  . furosemide (LASIX) 80 MG tablet Take 1 tablet (80 mg total) by mouth daily. (Patient taking differently: Take 80 mg by mouth 2 (two) times daily. )    . gabapentin (NEURONTIN) 100 MG capsule Take 1 capsule (100 mg total) by mouth 2 (two) times daily.    Marland Kitchen guaiFENesin (MUCINEX) 600 MG 12 hr tablet Take 2 tablets (1,200 mg total) by mouth 2 (two) times daily as needed for cough or to loosen phlegm.    . insulin aspart (NOVOLOG) 100 UNIT/ML injection Inject 0-9 Units into the skin 3 (three) times daily with meals. CBG < 70: implement hypoglycemia protocol CBG 70 - 120: 0 units CBG 121 - 150: 1 unit CBG 151 - 200: 2 units CBG 201 - 250: 3 units CBG 251 - 300: 5 units CBG 301 - 350: 7 units CBG 351 - 400: 9 units CBG > 400: call MD.    . insulin aspart (NOVOLOG) 100 UNIT/ML injection Inject 5 Units into the skin 3 (three) times daily with meals.    Marland Kitchen LEVEMIR FLEXTOUCH 100 UNIT/ML Pen Inject 18 Units into the skin daily.    . meclizine (ANTIVERT) 25 MG tablet Take 1 tablet (25 mg total) by mouth 3 (three) times daily as needed for dizziness. 30 tablet 0  . montelukast (SINGULAIR) 10 MG tablet TAKE ONE TABLET BY MOUTH ONCE DAILY (Patient taking differently: TAKE ONE TABLET BY MOUTH AT BEDTIME) 30 tablet 3  . nitroGLYCERIN (NITROSTAT) 0.4 MG SL tablet PLACE ONE TABLET UNDER THE TONGUE EVERY FIVE MINUTES AS NEEDED FOR CHEST PAIN 25 tablet 6  . omeprazole (PRILOSEC) 20 MG capsule Take 1 capsule (20 mg total) by mouth 2 (two) times daily before a meal. 28 capsule 0  . OXYGEN Inhale 2 L into the lungs  continuous. CPAP with oxygen at bedtime     . raloxifene (EVISTA) 60 MG tablet Take 60 mg by mouth every morning.     . TRUEPLUS PEN NEEDLES 32G X 4 MM MISC INJECT THREE TIMES A DAY AS DIRECTED  1  . Vitamin D, Ergocalciferol, (DRISDOL) 50000 UNITS CAPS capsule Take 50,000 Units by mouth every Monday.      No current facility-administered medications for this visit.     Allergies:   Morphine and related and Promethazine hcl    Social History:  The patient  reports that she quit smoking about 37 years ago. She has a 5.00 pack-year smoking history. She has never used smokeless tobacco. She reports that she does not drink alcohol or use drugs.   Family History:  The patient's family history includes Allergies in her sister; Cancer in her brother; Colon cancer in her brother; Diabetes in her mother, sister, and sister; Heart attack in her father; Heart disease in her mother, sister, and sister; Heart disease (age of onset: 83) in her father; Kidney disease in her mother, sister, and sister; Liver cancer in her mother; Sickle cell anemia in her father.    ROS:  Please see the history of present illness. All other systems are reviewed and negative.    PHYSICAL EXAM: VS:  There were no vitals taken for this visit. , BMI There is no height or weight on file to calculate BMI. GEN: Well nourished, well developed, female in no acute distress  HEENT: normal for age  Neck: no JVD, no carotid bruit, no masses Cardiac: RRR; no murmur, no rubs, or gallops Respiratory:  clear to auscultation bilaterally, normal work of breathing GI: soft, nontender, nondistended, + BS MS: no deformity or atrophy; no edema; distal pulses are 2+ in all 4 extremities   Skin: warm and dry, no rash Neuro:  Strength and sensation are intact Psych: euthymic mood, full affect   EKG:  EKG {ACTION; IS/IS HUD:14970263} ordered today. The ekg ordered today demonstrates ***  ECHO: 09/08/2017 - Left ventricle: The cavity size  was normal. There was moderate   concentric hypertrophy. Systolic function was vigorous. The   estimated ejection fraction was in the range of 65% to 70%. There   was dynamic obstruction at restin the mid cavity, with a peak   velocity of 110 cm/sec and a peak gradient of 5 mm Hg. Wall   motion was normal; there were no regional wall motion   abnormalities. There was an increased relative contribution of   atrial contraction to ventricular filling. Doppler parameters are   consistent with abnormal left ventricular relaxation (grade 1   diastolic dysfunction). Doppler parameters are consistent with   high ventricular filling pressure. - Pulmonary arteries: PA peak pressure: 39 mm Hg (S).  Impressions:  - moderate LVH, hyperdynamic LVF with mid cavity obliteration and   dynamic obstruction with resting gradient of 81mmHg in the mid LV   cavity to LVOT and grade 1 DD with increased filling pressures,   mild TR and mild pulmonary HTN. The right ventricular systolic   pressure was increased consistent with mild pulmonary   hypertension.    Recent Labs: 09/04/2017: B Natriuretic Peptide 58.5 09/05/2017: ALT 8; TSH 2.748 09/11/2017: Platelets 192 09/25/2017: BUN 35; Creatinine, Ser 1.97; Hemoglobin 9.5; Potassium 3.9; Sodium 145    Lipid Panel    Component Value Date/Time   CHOL 238 (H) 09/06/2017 0308   TRIG 174 (H) 09/06/2017 0308   HDL 37 (L) 09/06/2017 0308   CHOLHDL 6.4 09/06/2017 0308   VLDL 35 09/06/2017 0308   LDLCALC 166 (H) 09/06/2017 0308   LDLDIRECT 190.0 01/02/2009 1101     Wt Readings from Last 3 Encounters:  10/06/17 266 lb 3.2 oz (120.7 kg)  09/05/17 262 lb 5.6 oz (119 kg)  08/26/17 250 lb (113.4 kg)     Other studies Reviewed: Additional studies/ records that were reviewed today include: ***.  ASSESSMENT AND PLAN:  1.  ***   Current medicines are reviewed at length with the patient today.  The patient {ACTIONS; HAS/DOES NOT HAVE:19233} concerns  regarding medicines.  The following changes have been made:  {PLAN; NO CHANGE:13088:s}  Labs/ tests ordered today include: *** No orders of the defined types were placed in this encounter.    Disposition:   FU with ***  Signed, Rosaria Ferries, PA-C  10/07/2017 9:47 AM    [place] Phone: [phone]; Fax: [phone]  This note was written with the assistance of speech recognition software. Please excuse any transcriptional errors.

## 2017-10-07 NOTE — Progress Notes (Signed)
Spoke with pt and notified of results per Dr. Wert. Pt verbalized understanding and denied any questions. 

## 2017-10-09 ENCOUNTER — Encounter (HOSPITAL_COMMUNITY)
Admission: RE | Admit: 2017-10-09 | Discharge: 2017-10-09 | Disposition: A | Discharge status: Home / Self Care | Payer: Medicare Other | Source: Ambulatory Visit | Attending: Nephrology | Admitting: Nephrology

## 2017-10-09 ENCOUNTER — Encounter (HOSPITAL_COMMUNITY): Payer: Medicare Other

## 2017-10-09 VITALS — BP 183/83 | HR 60 | Temp 98.3°F | Resp 20

## 2017-10-09 DIAGNOSIS — N183 Chronic kidney disease, stage 3 unspecified: Secondary | ICD-10-CM

## 2017-10-09 DIAGNOSIS — D631 Anemia in chronic kidney disease: Secondary | ICD-10-CM | POA: Diagnosis not present

## 2017-10-09 LAB — POCT HEMOGLOBIN-HEMACUE: HEMOGLOBIN: 10 g/dL — AB (ref 12.0–15.0)

## 2017-10-09 MED ORDER — EPOETIN ALFA 40000 UNIT/ML IJ SOLN: 30000.0000 [IU] | INTRAMUSCULAR | Status: DC

## 2017-10-09 MED ORDER — EPOETIN ALFA 10000 UNIT/ML IJ SOLN
INTRAMUSCULAR | Status: AC
  Administered 2017-10-09: 10000 [IU] via SUBCUTANEOUS
  Filled 2017-10-09: qty 1

## 2017-10-09 MED ORDER — EPOETIN ALFA 20000 UNIT/ML IJ SOLN
INTRAMUSCULAR | Status: AC
  Administered 2017-10-09: 20000 [IU] via SUBCUTANEOUS
  Filled 2017-10-09: qty 1

## 2017-10-10 DIAGNOSIS — E1122 Type 2 diabetes mellitus with diabetic chronic kidney disease: Secondary | ICD-10-CM | POA: Diagnosis not present

## 2017-10-10 DIAGNOSIS — N183 Chronic kidney disease, stage 3 (moderate): Secondary | ICD-10-CM | POA: Diagnosis not present

## 2017-10-10 DIAGNOSIS — I2510 Atherosclerotic heart disease of native coronary artery without angina pectoris: Secondary | ICD-10-CM | POA: Diagnosis not present

## 2017-10-10 DIAGNOSIS — I130 Hypertensive heart and chronic kidney disease with heart failure and stage 1 through stage 4 chronic kidney disease, or unspecified chronic kidney disease: Secondary | ICD-10-CM | POA: Diagnosis not present

## 2017-10-10 DIAGNOSIS — I5022 Chronic systolic (congestive) heart failure: Secondary | ICD-10-CM | POA: Diagnosis not present

## 2017-10-10 DIAGNOSIS — D631 Anemia in chronic kidney disease: Secondary | ICD-10-CM | POA: Diagnosis not present

## 2017-10-14 ENCOUNTER — Other Ambulatory Visit: Payer: Self-pay | Admitting: *Deleted

## 2017-10-14 NOTE — Patient Outreach (Signed)
[facility]) Care Management  10/14/2017  Joann Gutierrez 1945-03-30 [id number]   Weekly transition of care call placed to [facility] caregiver, [name].  He report member is doing "alright."  State there is still some confusion regarding medications, unable to review at this time but will review during home visit.  This care manager inquired about member's blood pressure and blood sugar today, he is unsure as they have just gotten out of bed.  Denies that member has had any urgent complaints over the last week, home visit scheduled for next week.  [facility] CM Care Plan Problem One     Most Recent Value  Care Plan Problem One  Risk for readmission as evidenced by recent hospitalization requiring SNF stay.  Role Documenting the Problem One  Care Management [place] for Problem One  Active  [facility] Long Term Goal   Patient will have no acute care admission for the next 31 days.  [facility] Long Term Goal Start Date  10/07/17  [facility] CM Short Term Goal #1   Member will have follow up appointment with primary MD within the next 4 weeks  [facility] CM Short Term Goal #1 Start Date  10/07/17  [facility] CM Short Term Goal #2   Member will report taking medications as instructed over the next 4 weeks  [facility] CM Short Term Goal #2 Start Date  10/07/17     [name], RN, MSN [place] Manager [phone]

## 2017-10-14 NOTE — Progress Notes (Addendum)
@Patient  ID: Joann Gutierrez, female    DOB: 04/25/1945, 72 y.o.   MRN: [id number]  Chief Complaint  Patient presents with  . Follow-up    States she feels like her breathing is not getting better.     Referring provider: Seward Carol, MD  HPI:  71 year old female former smoker followed in our office for asthma, obstructive sleep apnea, obesity hypoventilation syndrome, elevated right hemidiaphragm (first noted November 2011)  PMH: CKD stage III, postherpetic neuralgia, GERD, gastroparesis, T2DM, esophageal dysmotility, morbid obesity Smoker/ Smoking History: Former smoker.  Quit in 1982.  5 pack years Maintenance:  Breo 100  Pt of: Dr. Melvyn Novas    10/15/2017  - Visit   72 year old patient presenting today for follow-up visit.  Patient recently had blood pressure medications changed at last office visit.  Patient did bring all medications to office visit as instructed at last week's visit.  Unfortunately patient still has not made the changes.  Patient was still taking carvedilol and Bisoprolol.  Patient reports that blood pressure has been elevated when checked by home health nurse as well as home caretaker [name].   Patient reports adherence to [facility] Ellipta 100.  Patient says she is using her rescue inhaler 1-2 times a day.  Patient reports she is having continued dyspnea.  Patient reports that this is been going on for "a long time now".  Patient reports that this is not improved since her last office visit.  Patient has subjective wheezing she says her wheezing worsens when she lays flat.  Patient reports she has an audible wheeze which then she proceeds to produce a wheeze on command in room.  Patient does continue to have a cough with yellow/green productive mucus.  She reports that the mucus has lightened slightly as she is finishing up Augmentin currently.  Patient reports that she is been adherent to wearing her CPAP nightly with no issues.  Patient reported that she sleeps well  when using her CPAP.  Patient has home health through [facility].  Patient has not been back to follow-up with primary care.      Tests:   PSG 04/28/09 >> AHI 11, SpO2 low 68% Spirometry 07/05/10 >> FEV1 1.30 (64%), FEV1% 80 >> coughing during test ONO with CPAP and room air 10/22/12 >> Test time 10 hrs 59 min. Mean SpO2 92.4%, low SpO2 75%. Spent 16 min with SpO2 < 89%. PFT 03/17/13 >> FEV1 1.27 (67%), FEV1% 84, TLC 4.06 (80%), DLCO 57%, borderline BD   Imaging:  10/24/2016-chest x-ray-no evidence of acute cardiopulmonary abnormality 11/27/2015-CT maxillofacial without contrast- inflammatory changes centered in the soft tissues of the left nasal alla and left nasolabial fold extending superior to left periorbital soft tissues.  Mild sphenoid and maxillary sinus mucosal thickening  09/08/17 - CT head - trace layering opacity within left sphenoid sinus, paranasal sinuses otherwise clear  Cardiac:  Echo 02/04/13 >> mod LVH, EF 65 to 40%, grade 1 diastolic dysfx, PAS 42 mmHg Echo 05/21/11 >> EF 55 to 60%, mild LVH, mild/mod TR, PAS 45 mmHg Lt, Rt Heart cath 03/03/13 >> RA 10, RV 38/7, PA 41/16 28, PCWP 13, LV 142/5, AO 137/76  Labs:  RAST 12/11/12 >> negative, IgE 63.3    Chart Review:     Specialty Problems      Pulmonary Problems   Chronic obstructive asthma (HCC)    Spirometry 07/05/10 >> FEV1 1.30 (64%), FEV1% 80  - Spirometry 02/06/2017  FEV1 0.88 (49%)  Ratio 63 with f/v  loop non-physiologic but min curvature in effort indep portion  - 02/06/2017  After extensive coaching inhaler device  effectiveness =    25% from a basline of 0           Obstructive sleep apnea    CPAP 9 cm H2O with 2 liters oxygen       Chronic respiratory failure (HCC)   Dyspnea    Spirometry 07/05/10 >> FEV1 1.30 (64%), FEV1% 80  -09/22/2014   Walked RA x one lap @ 185 stopped due to  Sob/ slow pace, no desat  - rec max gerd rx including reglan 09/22/2014 >>>       Cough   Acute asthmatic bronchitis     Spirometry 07/05/10 >> FEV1 1.30 (64%), FEV1% 80  05/20/2014 p extensive coaching HFA effectiveness =    50%       Throat congestion      Allergies  Allergen Reactions  . Morphine And Related Other (See Comments)    Family request not to be given, reports pt does not wake up when given   . Promethazine Hcl Other (See Comments)    REACTION: lethargy    Immunization History  Administered Date(s) Administered  . Influenza Split 10/16/2010, 11/10/2012  . Influenza Whole 01/02/2009, 11/02/2009, 10/31/2011  . Influenza,inj,Quad PF,6+ Mos 09/24/2013, 10/16/2015  . Influenza-Unspecified 11/21/2016  . Pneumococcal Conjugate-13 10/16/2015  . Pneumococcal Polysaccharide-23 10/22/2002, 10/21/2009, 09/12/2013, 09/07/2017  . Td 08/22/2002    Past Medical History:  Diagnosis Date  . Adenomatous colon polyp   . Allergy   . Anemia   . Asthma       . CAD (coronary artery disease)    Mild very minimal coronary disease with 20% obtuse marginal stenosis  . Carpal tunnel syndrome on left   . CHF (congestive heart failure) ([place])   . Chronic kidney disease (CKD), stage III (moderate) (HCC)   . COPD (chronic obstructive pulmonary disease) ([place])   . CVA (cerebral infarction)   . Depression   . 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 Diverticulosis   . 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 Esophageal dysmotility   . Esophageal stricture   . Gastritis   . Gastroparesis 08/21/2007  . GERD (gastroesophageal reflux disease)   . Hair loss   . Hearing loss of both ears   . Hernia, hiatal   . Hyperlipidemia   . Hypertension   . Morbid obesity ([place])   . Obesity   . Osteoarthritis   . OSTEOARTHRITIS 08/09/2006  . Osteoporosis   . 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 RENAL INSUFFICIENCY 02/16/2009  . 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 Seizures ([place])    pt thinks it has been several monthes since she had a seisure  . Shingles   . Sickle cell trait ([place])   . Stroke  ([place])   . Tubular adenoma of colon     Tobacco History: Social History   Tobacco Use  Smoking Status Former Smoker  . Packs/day: 0.50  . Years: 10.00  . Pack years: 5.00  . Last attempt to quit: 04/18/1980  . Years since quitting: 37.5  Smokeless Tobacco Never Used   Counseling given: Not Answered   Outpatient Encounter Medications as of 10/15/2017  Medication Sig  . albuterol (PROVENTIL HFA;VENTOLIN HFA) 108 (90 Base) MCG/ACT inhaler Inhale 2 puffs into the lungs every 6 (six) hours as needed for wheezing or shortness of breath.  Marland Kitchen amoxicillin-clavulanate (AUGMENTIN) 875-125  MG tablet Take 1 tablet by mouth 2 (two) times daily for 10 days.  Marland Kitchen aspirin EC 81 MG tablet Take 81 mg by mouth daily.  Marland Kitchen azelastine (ASTELIN) 0.1 % nasal spray 1 spray 2 (two) times daily. Use in each nostril as directed  . calcium carbonate (OSCAL) 1500 (600 Ca) MG TABS tablet Take 600 mg of elemental calcium by mouth daily with breakfast.  . carvedilol (COREG) 12.5 MG tablet Take 12.5 mg by mouth 2 (two) times daily with a meal.  . donepezil (ARICEPT) 10 MG tablet Take 10 mg by mouth at bedtime.  Marland Kitchen ezetimibe (ZETIA) 10 MG tablet Take 10 mg by mouth daily.   . fluticasone (FLONASE) 50 MCG/ACT nasal spray USE TWO SPRAYS INTO BOTH NOSTRILS DAILY  . fluticasone furoate-vilanterol (BREO ELLIPTA) 100-25 MCG/INH AEPB Inhale 1 puff into the lungs daily.  . furosemide (LASIX) 80 MG tablet Take 1 tablet (80 mg total) by mouth daily. (Patient taking differently: Take 80 mg by mouth 2 (two) times daily. )  . gabapentin (NEURONTIN) 100 MG capsule Take 1 capsule (100 mg total) by mouth 2 (two) times daily.  Marland Kitchen guaiFENesin (MUCINEX) 600 MG 12 hr tablet Take 2 tablets (1,200 mg total) by mouth 2 (two) times daily as needed for cough or to loosen phlegm.  . hydrALAZINE (APRESOLINE) 100 MG tablet Take 100 mg by mouth 2 (two) times daily.  . insulin aspart (NOVOLOG) 100 UNIT/ML injection Inject 0-9 Units into the skin 3 (three)  times daily with meals. CBG < 70: implement hypoglycemia protocol CBG 70 - 120: 0 units CBG 121 - 150: 1 unit CBG 151 - 200: 2 units CBG 201 - 250: 3 units CBG 251 - 300: 5 units CBG 301 - 350: 7 units CBG 351 - 400: 9 units CBG > 400: call MD.  . insulin aspart (NOVOLOG) 100 UNIT/ML injection Inject 5 Units into the skin 3 (three) times daily with meals.  Marland Kitchen LEVEMIR FLEXTOUCH 100 UNIT/ML Pen Inject 18 Units into the skin daily.  Marland Kitchen losartan (COZAAR) 25 MG tablet Take 25 mg by mouth daily.  . meclizine (ANTIVERT) 25 MG tablet Take 1 tablet (25 mg total) by mouth 3 (three) times daily as needed for dizziness.  . montelukast (SINGULAIR) 10 MG tablet TAKE ONE TABLET BY MOUTH ONCE DAILY (Patient taking differently: TAKE ONE TABLET BY MOUTH AT BEDTIME)  . nitroGLYCERIN (NITROSTAT) 0.4 MG SL tablet PLACE ONE TABLET UNDER THE TONGUE EVERY FIVE MINUTES AS NEEDED FOR CHEST PAIN  . nystatin (NYSTATIN) powder Apply topically 4 (four) times daily. Apply to abdominal fold daily PRN  . OXYGEN Inhale 2 L into the lungs continuous. CPAP with oxygen at bedtime   . raloxifene (EVISTA) 60 MG tablet Take 60 mg by mouth every morning.   . sucralfate (CARAFATE) 1 g tablet Take 1 g by mouth 2 (two) times daily.  . TRUEPLUS PEN NEEDLES 32G X 4 MM MISC INJECT THREE TIMES A DAY AS DIRECTED  . valACYclovir (VALTREX) 500 MG tablet Take 500 mg by mouth 3 (three) times daily.  . Vitamin D, Ergocalciferol, (DRISDOL) 50000 UNITS CAPS capsule Take 50,000 Units by mouth every Monday.   . bisoprolol (ZEBETA) 5 MG tablet Take 1 tablet (5 mg total) by mouth daily. (Patient not taking: Reported on 10/15/2017)  . omeprazole (PRILOSEC) 20 MG capsule Take 1 capsule (20 mg total) by mouth 2 (two) times daily before a meal.   No facility-administered encounter medications on file as of 10/15/2017.  Review of Systems  Review of Systems  Constitutional: Negative for chills, fatigue, fever (subjective fevers, feels hot, no  recorded fevers) and unexpected weight change.  HENT: Negative for congestion, ear pain, postnasal drip, sinus pressure and sinus pain.   Respiratory: Positive for cough (yellow / green mucous, productive cough usually in morning), shortness of breath (chronic ) and wheezing (subjective wheezing at night ). Negative for chest tightness.   Cardiovascular: Negative for chest pain and palpitations.  Gastrointestinal: Negative for blood in stool, diarrhea, nausea and vomiting.  Genitourinary: Negative for dysuria, frequency and urgency.  Musculoskeletal: Negative for arthralgias.  Skin: Negative for color change.  Allergic/Immunologic: Negative for environmental allergies and food allergies.  Neurological: Negative for dizziness, light-headedness and headaches.  Psychiatric/Behavioral: Positive for confusion (Continued confusion regarding plan of care and medication). Negative for dysphoric mood. The patient is not nervous/anxious.   All other systems reviewed and are negative.    Physical Exam  BP (!) 192/102 Comment: taken prior to checking out, BP meds given with instructions per Wyn Quaker FNP for further treatment  Pulse 64   Ht 5\' 4"  (1.626 m)   Wt 264 lb 6.4 oz (119.9 kg)   SpO2 96%   BMI 45.38 kg/m   Wt Readings from Last 5 Encounters:  10/15/17 264 lb 6.4 oz (119.9 kg)  10/06/17 266 lb 3.2 oz (120.7 kg)  09/05/17 262 lb 5.6 oz (119 kg)  08/26/17 250 lb (113.4 kg)  08/12/17 252 lb (114.3 kg)    Physical Exam  Constitutional: She is oriented to person, place, and time and well-developed, well-nourished, and in no distress. No distress.  Discussed extensively patient's elevated blood pressure.  Vital signs are not within normal limits.  HENT:  Head: Normocephalic and atraumatic.  Right Ear: Hearing, tympanic membrane, external ear and ear canal normal.  Left Ear: Hearing, tympanic membrane, external ear and ear canal normal.  Nose: Right sinus exhibits no maxillary sinus  tenderness and no frontal sinus tenderness. Left sinus exhibits maxillary sinus tenderness. Left sinus exhibits no frontal sinus tenderness.  Mouth/Throat: Uvula is midline and oropharynx is clear and moist. No oropharyngeal exudate.  Slight maxillary tenderness to palpation, patient has tenderness throughout entire neck and facial exam due to chronic herpetic neuralgia  Eyes: Pupils are equal, round, and reactive to light.  Neck: Normal range of motion. Neck supple. No JVD present.  +Tenderness on exam due to chronic herpetic neuralgia  Cardiovascular: Normal rate, regular rhythm and normal heart sounds.  Pulmonary/Chest: Effort normal and breath sounds normal. No accessory muscle usage. No respiratory distress. She has no decreased breath sounds. She has no wheezes. She has no rhonchi.  Shallow breaths on exam  Abdominal: Soft. Bowel sounds are normal. There is no tenderness.  Musculoskeletal: Normal range of motion. She exhibits no edema.  Lymphadenopathy:    She has no cervical adenopathy.  Neurological: She is alert and oriented to person, place, and time.  Using cane  Skin: Skin is warm and dry. She is not diaphoretic. No erythema.  Psychiatric: Mood, memory, affect and judgment normal.  Nursing note and vitals reviewed.     Lab Results:  CBC    Component Value Date/Time   WBC 7.7 09/11/2017 0218   RBC 3.96 09/11/2017 0218   HGB 10.0 (L) 10/09/2017 1120   HCT 29.7 (L) 09/11/2017 0218   PLT 192 09/11/2017 0218   MCV 75.0 (L) 09/11/2017 0218   MCH 22.2 (L) 09/11/2017 0218   MCHC 29.6 (  L) 09/11/2017 0218   RDW 19.9 (H) 09/11/2017 0218   LYMPHSABS 2.0 09/05/2017 0339   MONOABS 1.5 (H) 09/05/2017 0339   EOSABS 0.1 09/05/2017 0339   BASOSABS 0.0 09/05/2017 0339    BMET    Component Value Date/Time   NA 145 09/25/2017 1058   K 3.9 09/25/2017 1058   CL 107 09/25/2017 1058   CO2 28 09/25/2017 1058   GLUCOSE 134 (H) 09/25/2017 1058   BUN 35 (H) 09/25/2017 1058    CREATININE 1.97 (H) 09/25/2017 1058   CREATININE 1.36 (H) 07/24/2011 0925   CALCIUM 8.3 (L) 09/25/2017 1058   CALCIUM 8.2 (L) 09/25/2017 1058   GFRNONAA 24 (L) 09/25/2017 1058   GFRAA 28 (L) 09/25/2017 1058    BNP    Component Value Date/Time   BNP 58.5 09/04/2017 2139    ProBNP    Component Value Date/Time   PROBNP 65.0 05/20/2014 1620    Imaging: Dg Chest 2 View  Result Date: 10/06/2017 CLINICAL DATA:  Cough, chest congestion, and wheezing for the past 5 days. History of asthma, former smoker. EXAM: CHEST - 2 VIEW COMPARISON:  Chest x-ray of September 04, 2017. FINDINGS: The lungs are adequately inflated. The right hemidiaphragm remains higher than the left. There is mildly increased density at the right lung base posteriorly which is less conspicuous than on the previous study. The left lung is clear. The heart is top-normal in size. The pulmonary vascularity is normal. There is calcification in the wall of the aortic arch. The trachea is midline. IMPRESSION: Asthma-COPD. Right posterior basal atelectasis or pneumonia which has become somewhat less conspicuous than on the previous study. If it appears clinically that the patient's former symptoms have recurred, a repeat course of antibiotics may be indicated. A follow-up chest x-ray in 2-3 weeks is recommended. Electronically Signed   By: David  Martinique M.D.   On: 10/06/2017 11:10      Assessment & Plan:   72 year old patient presenting today for follow-up visit.  Patient is still having continued confusion regarding her medications.  Reviewed all medications with patient.  Applied black sharpie X on carvedilol.  Patient needs to stop this medication.  >>>Increased bisoprolol 5mg  to twice a day.   >>>Increased losartan 25 mg twice a day.  I have also encouraged the patient to follow-up with primary care.  Patient has many chronic medication and needs needs to have close follow-up with primary care.  Patient reports she will contact  primary care to schedule an appointment.  Patient also requests that I place a referral to be established with a different primary care.  I have done that today.  Patient is to follow-up with our office in 1 week with blood work to check kidney functioning.  Obstructive sleep apnea We recommend that you continue using your CPAP daily >>>Keep up the hard work using your device >>> Goal should be wearing this for the entire night that you are sleeping, at least 4 to 6 hours  Remember:  . Do not drive or operate heavy machinery if tired or drowsy.  . Please notify the supply company and office if you are unable to use your device regularly due to missing supplies or machine being broken.  . Work on maintaining a healthy weight and following your recommended nutrition plan  . Maintain proper daily exercise and movement  . Maintaining proper use of your device can also help improve management of other chronic illnesses such as: Blood pressure, blood sugars, and  weight management.   BiPAP/ CPAP Cleaning:  >>>Clean weekly, with Dawn soap, and bottle brush.  Set up to air dry.    Essential hypertension Bisoprolol 5 mg tablet >>> Take 1 tablet twice a day >>> This is for your blood pressure  PLEASE STOP Carvedilol   Losartan 25mg  tablet  >>> Take 1 tablet twice a day >>>this is for your blood pressure     I recommend that you follow-up with your primary care Dr.  Delfina Redwood to address your blood pressure not being controlled  Keep checking blood pressures at home, your blood pressure should be under 140/90  We will refer you to primary care at your request >>> You can also proceed to the first floor of her [facility] primary care office and ask if they are taking new patients  We will place a order for you to receive a SD card for your CPAP so we can obtain a download  Follow-up in the next 1 weeks to ensure her blood pressure is more to control >>> Bring all your medications to that office  visit >>>needs CMET    Hypertensive emergency Discussed extensively elevated blood pressures with patient.  If blood pressures remain elevated in spite of medication changes patient needs to present to emergency room or contact our office to be seen.  Acute asthmatic bronchitis Breo Ellipta 100 >>> Take 1 puff daily in the morning right when you wake up >>>Rinse your mouth out after use >>>This is a daily maintenance inhaler, NOT a rescue inhaler >>>Contact our office if you are having difficulties affording or obtaining this medication >>>It is important for you to be able to take this daily and not miss any doses     Chronic obstructive asthma (HCC) Breo Ellipta 100 >>> Take 1 puff daily in the morning right when you wake up >>>Rinse your mouth out after use >>>This is a daily maintenance inhaler, NOT a rescue inhaler >>>Contact our office if you are having difficulties affording or obtaining this medication >>>It is important for you to be able to take this daily and not miss any doses     Medication management Reviewed all medications with patient.  Stop carvedilol now  Increase bisoprolol to 5 mg twice daily Increase losartan 25 mg twice daily  Follow-up with our office in 1 week with all medications and hand   Ineffective health maintenance Poor health literacy. Poor plan of care adherence.  Patient needs close follow-up with our office with medications to ensure better management of her chronic diseases.  Patient is to follow-up with primary care.  Referral placed for a new primary care provider as well.  Healthcare maintenance Patient needs flu vaccine, patient to receive this at primary care or at local pharmacy as we are out of them today.   This appointment was 44 minutes along with her 50% of time in direct face-to-face patient care, assessment, plan of care follow-up.  Patient continues to struggle with health literacy as well as medication management.   Patient needs to have follow-up every week until we can get her established with primary care and on the correct medication regiment for her blood pressure.   Lauraine Rinne, NP 10/15/2017

## 2017-10-15 ENCOUNTER — Encounter: Payer: Self-pay | Admitting: Pulmonary Disease

## 2017-10-15 ENCOUNTER — Telehealth: Payer: Self-pay | Admitting: *Deleted

## 2017-10-15 ENCOUNTER — Ambulatory Visit (INDEPENDENT_AMBULATORY_CARE_PROVIDER_SITE_OTHER): Payer: Medicare Other | Admitting: Pulmonary Disease

## 2017-10-15 VITALS — BP 192/102 | HR 64 | Ht 64.0 in | Wt 264.4 lb

## 2017-10-15 DIAGNOSIS — G4733 Obstructive sleep apnea (adult) (pediatric): Secondary | ICD-10-CM | POA: Diagnosis not present

## 2017-10-15 DIAGNOSIS — I161 Hypertensive emergency: Secondary | ICD-10-CM

## 2017-10-15 DIAGNOSIS — J449 Chronic obstructive pulmonary disease, unspecified: Secondary | ICD-10-CM

## 2017-10-15 DIAGNOSIS — I10 Essential (primary) hypertension: Secondary | ICD-10-CM

## 2017-10-15 DIAGNOSIS — Z789 Other specified health status: Secondary | ICD-10-CM | POA: Diagnosis not present

## 2017-10-15 DIAGNOSIS — Z0000 Encounter for general adult medical examination without abnormal findings: Secondary | ICD-10-CM

## 2017-10-15 DIAGNOSIS — Z79899 Other long term (current) drug therapy: Secondary | ICD-10-CM | POA: Diagnosis not present

## 2017-10-15 DIAGNOSIS — Z7189 Other specified counseling: Secondary | ICD-10-CM | POA: Insufficient documentation

## 2017-10-15 NOTE — Telephone Encounter (Signed)
Called patient's caretaker in regards to high BP today, medication adjustments and changes; and discharge instructions.  Left message to call back.

## 2017-10-15 NOTE — Assessment & Plan Note (Signed)
Discussed extensively elevated blood pressures with patient.  If blood pressures remain elevated in spite of medication changes patient needs to present to emergency room or contact our office to be seen.

## 2017-10-15 NOTE — Assessment & Plan Note (Signed)
We recommend that you continue using your CPAP daily >>>Keep up the hard work using your device >>> Goal should be wearing this for the entire night that you are sleeping, at least 4 to 6 hours  Remember:  . Do not drive or operate heavy machinery if tired or drowsy.  . Please notify the supply company and office if you are unable to use your device regularly due to missing supplies or machine being broken.  . Work on maintaining a healthy weight and following your recommended nutrition plan  . Maintain proper daily exercise and movement  . Maintaining proper use of your device can also help improve management of other chronic illnesses such as: Blood pressure, blood sugars, and weight management.   BiPAP/ CPAP Cleaning:  >>>Clean weekly, with Dawn soap, and bottle brush.  Set up to air dry.   

## 2017-10-15 NOTE — Assessment & Plan Note (Signed)
Breo Ellipta 100 >>> Take 1 puff daily in the morning right when you wake up >>>Rinse your mouth out after use >>>This is a daily maintenance inhaler, NOT a rescue inhaler >>>Contact our office if you are having difficulties affording or obtaining this medication >>>It is important for you to be able to take this daily and not miss any doses

## 2017-10-15 NOTE — Patient Instructions (Addendum)
Bisoprolol 5 mg tablet >>> Take 1 tablet twice a day >>> This is for your blood pressure  PLEASE STOP Carvedilol   Losartan 25mg  tablet  >>> Take 1 tablet twice a day >>>this is for your blood pressure     I recommend that you follow-up with your primary care Dr.  Delfina Redwood to address your blood pressure not being controlled  Keep checking blood pressures at home, your blood pressure should be under 140/90  We will refer you to primary care at your request >>> You can also proceed to the first floor of her [facility] primary care office and ask if they are taking new patients  We will place a order for you to receive a SD card for your CPAP so we can obtain a download  Follow-up in the next 1 weeks to ensure her blood pressure is more to control >>> Bring all your medications to that office visit >>>needs CMET   Please receive the flu vaccine from primary care or your local pharmacy       It is flu season:   >>>Remember to be washing your hands regularly, using hand sanitizer, be careful to use around herself with has contact with people who are sick will increase her chances of getting sick yourself. >>> Best ways to protect herself from the flu: Receive the yearly flu vaccine, practice good hand hygiene washing with soap and also using hand sanitizer when available, eat a nutritious meals, get adequate rest, hydrate appropriately    As of 11/24/2017 we will be moving! We will no longer be at our [place] location.   Our new address and phone number will be:  [address] [place], [place] [address] Telephone number: [phone]   Please contact the office if your symptoms worsen or you have concerns that you are not improving.   Thank you for choosing [facility] for your healthcare, and for allowing [place] to partner with you on your healthcare journey. I am thankful to be able to provide care to you today.   Wyn Quaker FNP-C

## 2017-10-15 NOTE — Assessment & Plan Note (Signed)
Reviewed all medications with patient.  Stop carvedilol now  Increase bisoprolol to 5 mg twice daily Increase losartan 25 mg twice daily  Follow-up with our office in 1 week with all medications and hand

## 2017-10-15 NOTE — Assessment & Plan Note (Addendum)
Bisoprolol 5 mg tablet >>> Take 1 tablet twice a day >>> This is for your blood pressure  PLEASE STOP Carvedilol   Losartan 25mg  tablet  >>> Take 1 tablet twice a day >>>this is for your blood pressure     I recommend that you follow-up with your primary care Dr.  Delfina Redwood to address your blood pressure not being controlled  Keep checking blood pressures at home, your blood pressure should be under 140/90  We will refer you to primary care at your request >>> You can also proceed to the first floor of her [facility] office and ask if they are taking new patients  We will place a order for you to receive a SD card for your CPAP so we can obtain a download  Follow-up in the next 1 weeks to ensure her blood pressure is more to control >>> Bring all your medications to that office visit >>>needs CMET

## 2017-10-15 NOTE — Assessment & Plan Note (Signed)
Patient needs flu vaccine, patient to receive this at primary care or at local pharmacy as we are out of them today.

## 2017-10-15 NOTE — Assessment & Plan Note (Signed)
Poor health literacy. Poor plan of care adherence.  Patient needs close follow-up with our office with medications to ensure better management of her chronic diseases.  Patient is to follow-up with primary care.  Referral placed for a new primary care provider as well.

## 2017-10-16 DIAGNOSIS — I2510 Atherosclerotic heart disease of native coronary artery without angina pectoris: Secondary | ICD-10-CM | POA: Diagnosis not present

## 2017-10-16 DIAGNOSIS — I130 Hypertensive heart and chronic kidney disease with heart failure and stage 1 through stage 4 chronic kidney disease, or unspecified chronic kidney disease: Secondary | ICD-10-CM | POA: Diagnosis not present

## 2017-10-16 DIAGNOSIS — N183 Chronic kidney disease, stage 3 (moderate): Secondary | ICD-10-CM | POA: Diagnosis not present

## 2017-10-16 DIAGNOSIS — D631 Anemia in chronic kidney disease: Secondary | ICD-10-CM | POA: Diagnosis not present

## 2017-10-16 DIAGNOSIS — I5022 Chronic systolic (congestive) heart failure: Secondary | ICD-10-CM | POA: Diagnosis not present

## 2017-10-16 DIAGNOSIS — E1122 Type 2 diabetes mellitus with diabetic chronic kidney disease: Secondary | ICD-10-CM | POA: Diagnosis not present

## 2017-10-17 DIAGNOSIS — I129 Hypertensive chronic kidney disease with stage 1 through stage 4 chronic kidney disease, or unspecified chronic kidney disease: Secondary | ICD-10-CM | POA: Diagnosis not present

## 2017-10-17 DIAGNOSIS — E1122 Type 2 diabetes mellitus with diabetic chronic kidney disease: Secondary | ICD-10-CM | POA: Diagnosis not present

## 2017-10-17 DIAGNOSIS — E889 Metabolic disorder, unspecified: Secondary | ICD-10-CM | POA: Diagnosis not present

## 2017-10-17 DIAGNOSIS — M9080 Osteopathy in diseases classified elsewhere, unspecified site: Secondary | ICD-10-CM | POA: Diagnosis not present

## 2017-10-17 DIAGNOSIS — N184 Chronic kidney disease, stage 4 (severe): Secondary | ICD-10-CM | POA: Diagnosis not present

## 2017-10-17 DIAGNOSIS — D631 Anemia in chronic kidney disease: Secondary | ICD-10-CM | POA: Diagnosis not present

## 2017-10-18 DIAGNOSIS — I130 Hypertensive heart and chronic kidney disease with heart failure and stage 1 through stage 4 chronic kidney disease, or unspecified chronic kidney disease: Secondary | ICD-10-CM | POA: Diagnosis not present

## 2017-10-18 DIAGNOSIS — I2510 Atherosclerotic heart disease of native coronary artery without angina pectoris: Secondary | ICD-10-CM | POA: Diagnosis not present

## 2017-10-18 DIAGNOSIS — N183 Chronic kidney disease, stage 3 (moderate): Secondary | ICD-10-CM | POA: Diagnosis not present

## 2017-10-18 DIAGNOSIS — I5022 Chronic systolic (congestive) heart failure: Secondary | ICD-10-CM | POA: Diagnosis not present

## 2017-10-18 DIAGNOSIS — E1122 Type 2 diabetes mellitus with diabetic chronic kidney disease: Secondary | ICD-10-CM | POA: Diagnosis not present

## 2017-10-18 DIAGNOSIS — D631 Anemia in chronic kidney disease: Secondary | ICD-10-CM | POA: Diagnosis not present

## 2017-10-20 ENCOUNTER — Other Ambulatory Visit: Payer: Self-pay | Admitting: *Deleted

## 2017-10-20 ENCOUNTER — Encounter: Payer: Self-pay | Admitting: *Deleted

## 2017-10-20 DIAGNOSIS — I2510 Atherosclerotic heart disease of native coronary artery without angina pectoris: Secondary | ICD-10-CM | POA: Diagnosis not present

## 2017-10-20 DIAGNOSIS — N183 Chronic kidney disease, stage 3 (moderate): Secondary | ICD-10-CM | POA: Diagnosis not present

## 2017-10-20 DIAGNOSIS — E1122 Type 2 diabetes mellitus with diabetic chronic kidney disease: Secondary | ICD-10-CM | POA: Diagnosis not present

## 2017-10-20 DIAGNOSIS — I130 Hypertensive heart and chronic kidney disease with heart failure and stage 1 through stage 4 chronic kidney disease, or unspecified chronic kidney disease: Secondary | ICD-10-CM | POA: Diagnosis not present

## 2017-10-20 DIAGNOSIS — I5022 Chronic systolic (congestive) heart failure: Secondary | ICD-10-CM | POA: Diagnosis not present

## 2017-10-20 DIAGNOSIS — D631 Anemia in chronic kidney disease: Secondary | ICD-10-CM | POA: Diagnosis not present

## 2017-10-20 NOTE — Patient Outreach (Signed)
[facility]) Care Management   10/20/2017  Joann Gutierrez [birth year] [id number]  Joann Gutierrez is an 72 y.o. female  Subjective:   Member alert and oriented x3, denies any pain or discomfort.  Repot she has appointment with primary MD next week, follow up with pulmonologist this week.  Objective:   Review of Systems  Constitutional: Negative.   HENT: Negative.   Eyes: Negative.   Respiratory: Negative.   Cardiovascular: Positive for leg swelling.  Gastrointestinal: Negative.   Genitourinary: Negative.   Musculoskeletal: Negative.   Skin: Negative.   Neurological: Negative.   Endo/Heme/Allergies: Negative.   Psychiatric/Behavioral: Negative.     Physical Exam  Constitutional: She is oriented to person, place, and time. She appears well-developed and well-nourished.  Neck: Normal range of motion.  Cardiovascular: Normal rate, regular rhythm and normal heart sounds.  Respiratory: Effort normal and breath sounds normal.  GI: Soft. Bowel sounds are normal.  Musculoskeletal: Normal range of motion.  Neurological: She is alert and oriented to person, place, and time.  Skin: Skin is warm and dry.   BP (!) 150/92 (BP Location: Right Arm, Patient Position: Sitting, Cuff Size: Normal)   Pulse (!) 59   Resp 18   Ht 1.626 m (_0 )   Wt 264 lb (119.7 kg) Comment: per last pulmonologist visit  SpO2 95%   BMI 45.32 kg/m   Encounter Medications:   Outpatient Encounter Medications as of 10/20/2017  Medication Sig Note  . albuterol (PROVENTIL HFA;VENTOLIN HFA) 108 (90 Base) MCG/ACT inhaler Inhale 2 puffs into the lungs every 6 (six) hours as needed for wheezing or shortness of breath. 07/11/2017: Patient needs a new prescription  . aspirin EC 81 MG tablet Take 81 mg by mouth daily.   Marland Kitchen azelastine (ASTELIN) 0.1 % nasal spray 1 spray 2 (two) times daily. Use in each nostril as directed   . calcium carbonate (OSCAL) 1500 (600 Ca) MG TABS tablet Take 600 mg of  elemental calcium by mouth daily with breakfast.   . ezetimibe (ZETIA) 10 MG tablet Take 10 mg by mouth daily.    . fluticasone (FLONASE) 50 MCG/ACT nasal spray USE TWO SPRAYS INTO BOTH NOSTRILS DAILY   . fluticasone furoate-vilanterol (BREO ELLIPTA) 100-25 MCG/INH AEPB Inhale 1 puff into the lungs daily.   . furosemide (LASIX) 80 MG tablet Take 1 tablet (80 mg total) by mouth daily. (Patient taking differently: Take 80 mg by mouth 2 (two) times daily. )   . gabapentin (NEURONTIN) 100 MG capsule Take 1 capsule (100 mg total) by mouth 2 (two) times daily. 10/20/2017: Taking once a day  . guaiFENesin (MUCINEX) 600 MG 12 hr tablet Take 2 tablets (1,200 mg total) by mouth 2 (two) times daily as needed for cough or to loosen phlegm.   . hydrALAZINE (APRESOLINE) 100 MG tablet Take 100 mg by mouth 2 (two) times daily.   . insulin aspart (NOVOLOG) 100 UNIT/ML injection Inject 0-9 Units into the skin 3 (three) times daily with meals. CBG < 70: implement hypoglycemia protocol CBG 70 - 120: 0 units CBG 121 - 150: 1 unit CBG 151 - 200: 2 units CBG 201 - 250: 3 units CBG 251 - 300: 5 units CBG 301 - 350: 7 units CBG 351 - 400: 9 units CBG > 400: call MD.   . insulin aspart (NOVOLOG) 100 UNIT/ML injection Inject 5 Units into the skin 3 (three) times daily with meals.   Marland Kitchen LEVEMIR FLEXTOUCH 100 UNIT/ML Pen Inject  18 Units into the skin daily. 10/20/2017: 15 units daily  . meclizine (ANTIVERT) 25 MG tablet Take 1 tablet (25 mg total) by mouth 3 (three) times daily as needed for dizziness.   . montelukast (SINGULAIR) 10 MG tablet TAKE ONE TABLET BY MOUTH ONCE DAILY (Patient taking differently: TAKE ONE TABLET BY MOUTH AT BEDTIME)   . nitroGLYCERIN (NITROSTAT) 0.4 MG SL tablet PLACE ONE TABLET UNDER THE TONGUE EVERY FIVE MINUTES AS NEEDED FOR CHEST PAIN   . nystatin (NYSTATIN) powder Apply topically 4 (four) times daily. Apply to abdominal fold daily PRN   . OXYGEN Inhale 2 L into the lungs continuous. CPAP with  oxygen at bedtime    . raloxifene (EVISTA) 60 MG tablet Take 60 mg by mouth every morning.    . sucralfate (CARAFATE) 1 g tablet Take 1 g by mouth 2 (two) times daily.   . TRUEPLUS PEN NEEDLES 32G X 4 MM MISC INJECT THREE TIMES A DAY AS DIRECTED 07/11/2017: With Levemir  . valACYclovir (VALTREX) 500 MG tablet Take 500 mg by mouth 3 (three) times daily.   . Vitamin D, Ergocalciferol, (DRISDOL) 50000 UNITS CAPS capsule Take 50,000 Units by mouth every Monday.    . bisoprolol (ZEBETA) 5 MG tablet Take 1 tablet (5 mg total) by mouth daily. (Patient not taking: Reported on 10/15/2017)   . donepezil (ARICEPT) 10 MG tablet Take 10 mg by mouth at bedtime.   Marland Kitchen losartan (COZAAR) 25 MG tablet Take 25 mg by mouth daily.   Marland Kitchen omeprazole (PRILOSEC) 20 MG capsule Take 1 capsule (20 mg total) by mouth 2 (two) times daily before a meal.    No facility-administered encounter medications on file as of 10/20/2017.     Functional Status:   In your present state of health, do you have any difficulty performing the following activities: 10/20/2017 09/05/2017  Hearing? N Y  Vision? Y N  Difficulty concentrating or making decisions? Y N  Walking or climbing stairs? Y Y  Dressing or bathing? Y N  Doing errands, shopping? Y N  Preparing Food and eating ? Y -  In the past six months, have you accidently leaked urine? Y -  Do you have problems with loss of bowel control? N -  Managing your Medications? Y -  Managing your Finances? Y -  Housekeeping or managing your Housekeeping? Y -  Some recent data might be hidden    Fall/Depression Screening:    Fall Risk  10/20/2017 06/27/2017 12/21/2015  Falls in the past year? Yes Yes No  Number falls in past yr: 2 or more 1 -  Injury with Fall? No No -  Risk Factor Category  High Fall Risk - -  Risk for fall due to : History of fall(s) History of fall(s);Impaired vision;Impaired balance/gait;Medication side effect;Impaired mobility -  Follow up Education provided;Falls  prevention discussed Falls evaluation completed;Falls prevention discussed -  Comment - - -   PHQ 2/9 Scores 10/20/2017 06/27/2017 12/21/2015 12/11/2015 12/06/2015 08/03/2015 06/05/2015  PHQ - 2 Score 0 0 0 1 0 0 0  PHQ- 9 Score - - - - - - -  Exception Documentation - - - - - - -    Assessment:    Met with member at scheduled time, caregiver present during visit.  Consent on chart as member was previously involved with [facility], no changes with emergency contact.  Assessment complete.  Although member's A1C is greater than 11, report she has had diabetes for "years" and feel  she has been managing well until recently.  She is taking Levemir and Novolog but unsure what her prescribed sliding scale is.  She is advised and sliding scale written for guide.    Medications reviewed, several expired bottles removed from the home and discarded.  Has bottle of Carvedilol with "stop" written on it, but member state her kidney specialist wanted her to continue.  Caregiver disagrees and state it was changed to a medication starting with an "I", will check pharmacy as he think it was called in.  They both agree to pharmacy referral for review and medication management.  They both deny any urgent concerns, provided with this care manager's contact information.  Advised to contact with questions.  Educated on 24 hour nurse triage line as well.  Plan:   Will follow up within the next week.  [facility] CM Care Plan Problem One     Most Recent Value  Care Plan Problem One  Risk for readmission as evidenced by recent hospitalization requiring SNF stay.  Role Documenting the Problem One  Care Management [place] for Problem One  Active  [facility] Long Term Goal   Patient will have no acute care admission for the next 31 days.  [facility] Long Term Goal Start Date  10/07/17  Interventions for Problem One Long Term Goal  Member & caregiver educated on management of diabetes in effort to control condition.  Provided with  "Living Well With Diabetes" book  [facility] CM Short Term Goal #1   Member will have follow up appointment with primary MD within the next 4 weeks  [facility] CM Short Term Goal #1 Start Date  10/07/17  Interventions for Short Term Goal #1  Confirmed with caregiver member has MD appointment next week, confirmed she has transportation  [facility] CM Short Term Goal #2   Member will report taking medications as instructed over the next 4 weeks  [facility] CM Short Term Goal #2 Start Date  10/07/17  Interventions for Short Term Goal #2  Medications reviewed in the home, pill box filled.  Sliding scale handwritten for member/caregiver as a guide.  Referral placed to [place], RN, MSN [place] Manager [phone]

## 2017-10-21 ENCOUNTER — Other Ambulatory Visit: Payer: Self-pay | Admitting: Pharmacist

## 2017-10-21 NOTE — Progress Notes (Signed)
@Patient  ID: Joann Gutierrez, female    DOB: [birth year], 72 y.o.   MRN: [id number]  Chief Complaint  Patient presents with  . Follow-up    follow up asthma, patient is wheezing    Referring provider: Seward Carol, MD  HPI:  72 year old female former smoker followed in our office for asthma, obstructive sleep apnea, obesity hypoventilation syndrome, elevated right hemidiaphragm (first noted November 2011)  PMH: CKD stage III, postherpetic neuralgia, GERD, gastroparesis, T2DM, esophageal dysmotility, morbid obesity Smoker/ Smoking History: Former smoker.  Quit in 1982.  5 pack years Maintenance:  Breo 100  Pt of: Dr. Melvyn Novas    10/22/2017  - Visit   72 year old female patient presenting today for one-week follow-up visit.  For concerns regarding medication management as well as uncontrolled hypertension.  Patient presents today with [name] who is her main caretaker who lives with her.  Coralyn Mark reports that he takes patients do all of her appointments, help set up medication and helps coordinate care.  [name] the patient's primary care giver checks but blood patient's blood pressure regularly.  Most recent results according to [facility] were 180/88, 190/88.  [facility] on 10/20/17 and chart review reveals blood pressure was 150/92.  There seems to be a continued breakdown regarding medication management.  Coralyn Mark reports the patient verbalizes that she took her medications but he does not check behind or hand the medications to the patient.  Patient is actively working with [facility] with a registered nurse Brayton Layman who completed a home visit this past week on 10/20/17.  Also [facility] as a Investment banker, operational who is been working to coordinate the patient get established with packets of medications to help with medication adherence.  [name] has been primarily communicating with these representatives and helping coordinate patient's  care.  Patient reports continued adherence to [facility] and has only had to use her rescue inhaler one time in the past week which was yesterday.  Patient has a persistent complaint of wheezing and fatigue.   The patient is scheduled to see her primary care doctor tomorrow 10/23/2017.   Also unfortunately, the patient has not been adherent to wearing her CPAP.  Patient reports that [facility] contacted her and told her not to use it until she had a chip.  Patient has not received a chip.  Most recent compliance report and [facility] is from October/2017 which also showed poor compliance.   Tests:  PSG 04/28/09 >> AHI 11, SpO2 low 68% Spirometry 07/05/10 >> FEV1 1.30 (64%), FEV1% 80 >> coughing during test ONO with CPAP and room air 10/22/12 >> Test time 10 hrs 59 min. Mean SpO2 92.4%, low SpO2 75%. Spent 16 min with SpO2 < 89%. PFT 03/17/13 >> FEV1 1.27 (67%), FEV1% 84, TLC 4.06 (80%), DLCO 57%, borderline BD   Imaging:  10/24/2016-chest x-ray-no evidence of acute cardiopulmonary abnormality 11/27/2015-CT maxillofacial without contrast- inflammatory changes centered in the soft tissues of the left nasal alla and left nasolabial fold extending superior to left periorbital soft tissues.  Mild sphenoid and maxillary sinus mucosal thickening  09/08/17 - CT head - trace layering opacity within left sphenoid sinus, paranasal sinuses otherwise clear  Cardiac:  Echo 02/04/13 >> mod LVH, EF 65 to 13%, grade 1 diastolic dysfx, PAS 42 mmHg Echo 05/21/11 >> EF 55 to 60%, mild LVH, mild/mod TR, PAS 45 mmHg Lt, Rt Heart cath 03/03/13 >> RA 10, RV 38/7, PA 41/16 28,  PCWP 13, LV 142/5, AO 137/76  Labs:  RAST 12/11/12 >> negative, IgE 63.3  Chart Review:     Specialty Problems      Pulmonary Problems   Chronic obstructive asthma ([place])    Spirometry 07/05/10 >> FEV1 1.30 (64%), FEV1% 80  - Spirometry 02/06/2017  FEV1 0.88 (49%)  Ratio 63 with f/v loop non-physiologic but min curvature in effort  indep portion  - 02/06/2017  After extensive coaching inhaler device  effectiveness =    25% from a basline of 0           Obstructive sleep apnea    CPAP 9 cm H2O with 2 liters oxygen       Chronic respiratory failure (HCC)   Dyspnea    Spirometry 07/05/10 >> FEV1 1.30 (64%), FEV1% 80  -09/22/2014   Walked RA x one lap @ 185 stopped due to  Sob/ slow pace, no desat  - rec max gerd rx including reglan 09/22/2014 >>>       Cough   Acute asthmatic bronchitis    Spirometry 07/05/10 >> FEV1 1.30 (64%), FEV1% 80  05/20/2014 p extensive coaching HFA effectiveness =    50%       Throat congestion      Allergies  Allergen Reactions  . Morphine And Related Other (See Comments)    Family request not to be given, reports pt does not wake up when given   . Promethazine Hcl Other (See Comments)    REACTION: lethargy    Immunization History  Administered Date(s) Administered  . Influenza Split 10/16/2010, 11/10/2012  . Influenza Whole 01/02/2009, 11/02/2009, 10/31/2011  . Influenza,inj,Quad PF,6+ Mos 09/24/2013, 10/16/2015  . Influenza-Unspecified 11/21/2016  . Pneumococcal Conjugate-13 10/16/2015  . Pneumococcal Polysaccharide-23 10/22/2002, 10/21/2009, 09/12/2013, 09/07/2017  . Td 08/22/2002    Past Medical History:  Diagnosis Date  . Adenomatous colon polyp   . Allergy   . Anemia   . Asthma       . CAD (coronary artery disease)    Mild very minimal coronary disease with 20% obtuse marginal stenosis  . Carpal tunnel syndrome on left   . CHF (congestive heart failure) ([place])   . Chronic kidney disease (CKD), stage III (moderate) (HCC)   . COPD (chronic obstructive pulmonary disease) ([place])   . CVA (cerebral infarction)   . Depression   . 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 Diverticulosis   . 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 Esophageal dysmotility   . Esophageal stricture   . Gastritis   . Gastroparesis 08/21/2007  . GERD (gastroesophageal reflux  disease)   . Hair loss   . Hearing loss of both ears   . Hernia, hiatal   . Hyperlipidemia   . Hypertension   . Morbid obesity ([place])   . Obesity   . Osteoarthritis   . OSTEOARTHRITIS 08/09/2006  . Osteoporosis   . 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 RENAL INSUFFICIENCY 02/16/2009  . 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 Seizures ([place])    pt thinks it has been several monthes since she had a seisure  . Shingles   . Sickle cell trait ([place])   . Stroke ([place])   . Tubular adenoma of colon     Tobacco History: Social History   Tobacco Use  Smoking Status Former Smoker  . Packs/day: 0.50  . Years: 10.00  . Pack years: 5.00  . Last attempt to quit: 04/18/1980  .  Years since quitting: 37.5  Smokeless Tobacco Never Used   Counseling given: Not Answered  Continues to not smoke  Outpatient Encounter Medications as of 10/22/2017  Medication Sig  . albuterol (PROVENTIL HFA;VENTOLIN HFA) 108 (90 Base) MCG/ACT inhaler Inhale 2 puffs into the lungs every 6 (six) hours as needed for wheezing or shortness of breath.  Marland Kitchen amoxicillin (AMOXIL) 125 MG chewable tablet Chew 125 mg by mouth 2 (two) times daily.  Marland Kitchen aspirin EC 81 MG tablet Take 81 mg by mouth daily.  Marland Kitchen azelastine (ASTELIN) 0.1 % nasal spray 1 spray 2 (two) times daily. Use in each nostril as directed  . bisoprolol (ZEBETA) 5 MG tablet Take 1 tablet (5 mg total) by mouth daily.  . calcium carbonate (OSCAL) 1500 (600 Ca) MG TABS tablet Take 600 mg of elemental calcium by mouth daily with breakfast.  . donepezil (ARICEPT) 10 MG tablet Take 10 mg by mouth at bedtime.  Marland Kitchen ezetimibe (ZETIA) 10 MG tablet Take 10 mg by mouth daily.   . fluticasone (FLONASE) 50 MCG/ACT nasal spray USE TWO SPRAYS INTO BOTH NOSTRILS DAILY  . fluticasone furoate-vilanterol (BREO ELLIPTA) 100-25 MCG/INH AEPB Inhale 1 puff into the lungs daily.  . furosemide (LASIX) 80 MG tablet Take 1 tablet (80 mg total) by mouth daily. (Patient taking differently:  Take 80 mg by mouth 2 (two) times daily. )  . gabapentin (NEURONTIN) 100 MG capsule Take 1 capsule (100 mg total) by mouth 2 (two) times daily.  Marland Kitchen guaiFENesin (MUCINEX) 600 MG 12 hr tablet Take 2 tablets (1,200 mg total) by mouth 2 (two) times daily as needed for cough or to loosen phlegm.  . hydrALAZINE (APRESOLINE) 100 MG tablet Take 100 mg by mouth 2 (two) times daily.  . insulin aspart (NOVOLOG) 100 UNIT/ML injection Inject 0-9 Units into the skin 3 (three) times daily with meals. CBG < 70: implement hypoglycemia protocol CBG 70 - 120: 0 units CBG 121 - 150: 1 unit CBG 151 - 200: 2 units CBG 201 - 250: 3 units CBG 251 - 300: 5 units CBG 301 - 350: 7 units CBG 351 - 400: 9 units CBG > 400: call MD.  . insulin aspart (NOVOLOG) 100 UNIT/ML injection Inject 5 Units into the skin 3 (three) times daily with meals.  Marland Kitchen LEVEMIR FLEXTOUCH 100 UNIT/ML Pen Inject 18 Units into the skin daily.  Marland Kitchen losartan (COZAAR) 25 MG tablet Take 25 mg by mouth daily.  . meclizine (ANTIVERT) 25 MG tablet Take 1 tablet (25 mg total) by mouth 3 (three) times daily as needed for dizziness.  . montelukast (SINGULAIR) 10 MG tablet TAKE ONE TABLET BY MOUTH ONCE DAILY (Patient taking differently: TAKE ONE TABLET BY MOUTH AT BEDTIME)  . nepafenac (ILEVRO) 0.3 % ophthalmic suspension 1 drop daily.  . nitroGLYCERIN (NITROSTAT) 0.4 MG SL tablet PLACE ONE TABLET UNDER THE TONGUE EVERY FIVE MINUTES AS NEEDED FOR CHEST PAIN  . nystatin (NYSTATIN) powder Apply topically 4 (four) times daily. Apply to abdominal fold daily PRN  . omeprazole (PRILOSEC) 20 MG capsule Take 20 mg by mouth 2 (two) times daily before a meal.  . OXYGEN Inhale 2 L into the lungs continuous. CPAP with oxygen at bedtime   . raloxifene (EVISTA) 60 MG tablet Take 60 mg by mouth every morning.   . sucralfate (CARAFATE) 1 g tablet Take 1 g by mouth 2 (two) times daily.  . TRUEPLUS PEN NEEDLES 32G X 4 MM MISC INJECT THREE TIMES A DAY AS DIRECTED  .  valACYclovir  (VALTREX) 500 MG tablet Take 500 mg by mouth 3 (three) times daily.  . Vitamin D, Ergocalciferol, (DRISDOL) 50000 UNITS CAPS capsule Take 50,000 Units by mouth every Monday.    No facility-administered encounter medications on file as of 10/22/2017.      Review of Systems  Review of Systems  Constitutional: Positive for fatigue. Negative for chills, fever and unexpected weight change.  HENT: Negative for congestion, ear pain, postnasal drip, sinus pressure and sinus pain.   Respiratory: Positive for shortness of breath and wheezing. Negative for cough and chest tightness.   Cardiovascular: Negative for chest pain and palpitations.  Gastrointestinal: Negative for blood in stool, diarrhea, nausea and vomiting.  Genitourinary: Negative for dysuria, frequency and urgency.  Musculoskeletal: Positive for gait problem (Walks with cane). Negative for arthralgias.  Skin: Negative for color change.  Allergic/Immunologic: Negative for environmental allergies and food allergies.  Neurological: Negative for dizziness, light-headedness and headaches.  Psychiatric/Behavioral: Positive for confusion and decreased concentration. Negative for dysphoric mood. The patient is not nervous/anxious.   All other systems reviewed and are negative.    Physical Exam  BP (!) 180/94 (BP Location: Left Arm, Cuff Size: Normal)   Pulse 63   Ht 5\' 6"  (1.676 m)   Wt 268 lb (121.6 kg)   SpO2 95%   BMI 43.26 kg/m   Wt Readings from Last 5 Encounters:  10/22/17 268 lb (121.6 kg)  10/20/17 264 lb (119.7 kg)  10/15/17 264 lb 6.4 oz (119.9 kg)  10/06/17 266 lb 3.2 oz (120.7 kg)  09/05/17 262 lb 5.6 oz (119 kg)    Physical Exam  Constitutional: She is oriented to person, place, and time and well-developed, well-nourished, and in no distress. No distress.  HENT:  Head: Normocephalic and atraumatic.  Right Ear: Hearing, tympanic membrane, external ear and ear canal normal.  Left Ear: Hearing, tympanic membrane,  external ear and ear canal normal.  Nose: Nose normal. Right sinus exhibits no maxillary sinus tenderness and no frontal sinus tenderness. Left sinus exhibits no maxillary sinus tenderness and no frontal sinus tenderness.  Mouth/Throat: Uvula is midline and oropharynx is clear and moist. No oropharyngeal exudate.  Eyes: Pupils are equal, round, and reactive to light.  Neck: Normal range of motion. Neck supple. No JVD present.  Cardiovascular: Normal rate, regular rhythm and normal heart sounds.  Pulmonary/Chest: Effort normal and breath sounds normal. No accessory muscle usage. No respiratory distress. She has no decreased breath sounds. She has no wheezes. She has no rhonchi.  Abdominal: Soft. Bowel sounds are normal. There is no tenderness.  Musculoskeletal: Normal range of motion. She exhibits no edema.  Lymphadenopathy:    She has no cervical adenopathy.  Neurological: She is alert and oriented to person, place, and time. Gait normal.  Skin: Skin is warm and dry. She is not diaphoretic. No erythema.  Psychiatric: Mood and memory normal. She exhibits disordered thought content. She has a flat affect.  Confusion regarding plan of care and other management medications.   Nursing note and vitals reviewed.    Lab Results:  CBC    Component Value Date/Time   WBC 7.7 09/11/2017 0218   RBC 3.96 09/11/2017 0218   HGB 10.0 (L) 10/09/2017 1120   HCT 29.7 (L) 09/11/2017 0218   PLT 192 09/11/2017 0218   MCV 75.0 (L) 09/11/2017 0218   MCH 22.2 (L) 09/11/2017 0218   MCHC 29.6 (L) 09/11/2017 0218   RDW 19.9 (H) 09/11/2017 0218   LYMPHSABS  2.0 09/05/2017 0339   MONOABS 1.5 (H) 09/05/2017 0339   EOSABS 0.1 09/05/2017 0339   BASOSABS 0.0 09/05/2017 0339    BMET    Component Value Date/Time   NA 145 09/25/2017 1058   K 3.9 09/25/2017 1058   CL 107 09/25/2017 1058   CO2 28 09/25/2017 1058   GLUCOSE 134 (H) 09/25/2017 1058   BUN 35 (H) 09/25/2017 1058   CREATININE 1.97 (H) 09/25/2017  1058   CREATININE 1.36 (H) 07/24/2011 0925   CALCIUM 8.3 (L) 09/25/2017 1058   CALCIUM 8.2 (L) 09/25/2017 1058   GFRNONAA 24 (L) 09/25/2017 1058   GFRAA 28 (L) 09/25/2017 1058    BNP    Component Value Date/Time   BNP 58.5 09/04/2017 2139    ProBNP    Component Value Date/Time   PROBNP 65.0 05/20/2014 1620    Imaging: Dg Chest 2 View  Result Date: 10/06/2017 CLINICAL DATA:  Cough, chest congestion, and wheezing for the past 5 days. History of asthma, former smoker. EXAM: CHEST - 2 VIEW COMPARISON:  Chest x-ray of September 04, 2017. FINDINGS: The lungs are adequately inflated. The right hemidiaphragm remains higher than the left. There is mildly increased density at the right lung base posteriorly which is less conspicuous than on the previous study. The left lung is clear. The heart is top-normal in size. The pulmonary vascularity is normal. There is calcification in the wall of the aortic arch. The trachea is midline. IMPRESSION: Asthma-COPD. Right posterior basal atelectasis or pneumonia which has become somewhat less conspicuous than on the previous study. If it appears clinically that the patient's former symptoms have recurred, a repeat course of antibiotics may be indicated. A follow-up chest x-ray in 2-3 weeks is recommended. Electronically Signed   By: David  [place] M.D.   On: 10/06/2017 11:10      Assessment & Plan:   72 year old female patient seen today for follow-up visit.  Unfortunately patient's blood pressure still elevated today.  I suspect the patient did not take her medications today.  Patient is adamant that she did.  I emphasized the importance that [name] her caregiver or someone else needs to observe her taking all of her medications from now on.  Patient agrees that she will do this.  Emphasized the importance satiety that she takes all of her afternoon medications.  He continues to monitor blood pressure.  If symptoms worsen and patient starts having increased  headache, blurred vision, slurred speech, arm pain, chest pain the patient will need to present to the emergency room for further management of blood pressure.  They agree.  Patient denies having any the symptoms currently.  Patient has a follow-up with primary care tomorrow on 10/23/2017.  Patient as well as caregiver [name] report that they will not miss this appointment and will be present to discuss blood pressure with primary care.  I provided a list of topics to discuss with primary care as I have a multitude of concerns regarding the patient as well as her medication management.  I will not add more blood pressure medications today because I suspect the patient is not taking her medications correctly.  I do not want to add to the confusion.  I will defer to primary care who she will see tomorrow on 10/23/2017 as they should be the ones managing this chronic issue moving forward.  I have followed up with [facility] pharmacist Jaclyn Shaggy to update her on plan of care.  I have refilled respiratory  medications as [facility]  So they can continue to work with the patient on medication management.  Unfortunately it appears the patient is now reporting she has not been using her CPAP.  She reports this is because is what she was instructed from her DME company.  We will have patient resume CPAP use, as well as place in order to have a ST card provided for the patient.  [facility] for [facility].  Patient to continue Breo Ellipta 100, can use rescue inhaler as needed for shortness of breath and wheezing, follow-up in 1 to 2 weeks.  Medication management Continue medications as prescribed Continue bisoprolol 5 mg twice daily Continue to not take carvedilol Continue to take losartan 25 mg twice daily Ensure you are taking Lasix 80 mg daily  Items to discuss with primary care tomorrow  Uncontrolled blood  pressure  Recently stopped Carvedilol >>> replaced with Bisoprolol 5mg  twice daily   Recently increased Losartan 25mg  twice daily   Concern with Lasix 80mg  daily adherence based off of Pharmacy records   Continue to work/coordinate with [place] who completed a home visit this week  Refills for primary care medications in order for [facility] to assist with medication management  [name] Pharmacist   Potential in-home help to help with medication management  Referral for memory care as patient's memory appears to have deficits  Continued follow-up for management of high blood pressure  Discussion of what goal blood pressure should be so tight he knows how to follow-up when checking during the week  Weekly follow-up with primary care to ensure her blood pressure becomes adequately controlled     Healthcare maintenance Continue medications as prescribed Continue bisoprolol 5 mg twice daily Continue to not take carvedilol Continue to take losartan 25 mg twice daily Ensure you are taking Lasix 80 mg daily  Items to discuss with primary care tomorrow  Uncontrolled blood pressure  Recently stopped Carvedilol >>> replaced with Bisoprolol 5mg  twice daily   Recently increased Losartan 25mg  twice daily   Concern with Lasix 80mg  daily adherence based off of Pharmacy records   Continue to work/coordinate with [place] who completed a home visit this week  Refills for primary care medications in order for [facility] to assist with medication management  [name] Pharmacist   Potential in-home help to help with medication management  Referral for memory care as patient's memory appears to have deficits  Continued follow-up for management of high blood pressure  Discussion of what goal blood pressure should be so tight he knows how to follow-up when checking during the week  Weekly follow-up with  primary care to ensure her blood pressure becomes adequately controlled  We need you to resume using her CPAP.  We have placed an order [facility] to get an SD card so that we can start getting reports on your compliance  We recommend that you continue using your CPAP daily >>>Keep up the hard work using your device >>> Goal should be wearing this for the entire night that you are sleeping, at least 4 to 6 hours  Remember:  . Do not drive or operate heavy machinery if tired or drowsy.  . Please notify the supply company and office if you are unable to use your device regularly due to missing supplies or machine being broken.  . Work  on maintaining a healthy weight and following your recommended nutrition plan  . Maintain proper daily exercise and movement  . Maintaining proper use of your device can also help improve management of other chronic illnesses such as: Blood pressure, blood sugars, and weight management.   BiPAP/ CPAP Cleaning:  >>>Clean weekly, with Dawn soap, and bottle brush.  Set up to air dry.  Follow-up in 1 to 2 weeks to address breathing as well as CPAP compliance   Obstructive sleep apnea We need you to resume using her CPAP.  We have placed an order with [facility] to get an SD card so that we can start getting reports on your compliance  We recommend that you continue using your CPAP daily >>>Keep up the hard work using your device >>> Goal should be wearing this for the entire night that you are sleeping, at least 4 to 6 hours  Remember:  . Do not drive or operate heavy machinery if tired or drowsy.  . Please notify the supply company and office if you are unable to use your device regularly due to missing supplies or machine being broken.  . Work on maintaining a healthy weight and following your recommended nutrition plan  . Maintain proper daily exercise and movement  . Maintaining proper use of your device can also help improve  management of other chronic illnesses such as: Blood pressure, blood sugars, and weight management.   BiPAP/ CPAP Cleaning:  >>>Clean weekly, with Dawn soap, and bottle brush.  Set up to air dry.  Follow-up in 1 to 2 weeks to address breathing as well as CPAP compliance   This appointment was 46 minutes along with her 50% of the time direct face-to-face patient care, assessment, plan of care follow-up with patient, patient's caregiver [name], and coordination of care with [facility].   Lauraine Rinne, NP 10/22/2017

## 2017-10-21 NOTE — Patient Outreach (Addendum)
[facility]) Care Management  [place]   10/21/2017  Joann Gutierrez [birth year] [id number]  72 y.o. year old female referred to [facility] for medication management.   Referral source: [facility] RN Current insurance: Unknown Currently receiving Extra Help:  []  Yes []  No [x]  Unknown  PMHx includes, but not limited to, former smoker, asthma, COPD, OSA, OHS, CKD-III, T2DM, postherpetic neuralgia, GERD, gastroparesis, esophageal dysmotility, morbid obesity, HTN, HLD, CAD, hx CVA, depression, osteoarthritis, poor health literacy.  Noted inhaler device technique ~25% per office notes Jan 2019. Primary care referral placed 10/15/2017.   11:04AM Successful call placed to [name] today.  HIPAA identifiers verified. Patient verbalizes consent to talk with her caregiver, [name].  [name] requests that I call him back later today so that he has time to gather all the medications.    1:30PM Call placed to [name].  [name] has left to go to the store and is not available.    2:47PM Successful call with Clint Guy.  HIPAA identifiers verified.  Caregiver fills pillbox for patient weekly.  He assists patient with medical appointments and states he hasn't missed an appointment in 6 years.  He states he and patient argue every day about patient taking her medications.  He states she came back from rehab about 2 weeks ago and this is where several medication problems came due to confusion with changes in regimen.  He reports that patient needs refills on all medications.  She has appointment with PCP [name] on Thursday morning and [name] will bring all bottles to office visit.  He reports that [facility]. Patient agreeable to start using compliance packs to improve adherence and reduce stress of organizing medications.    Objective: Lab Results  Component Value Date   HGBA1C 11.5 (H) 09/06/2017    Lipid Panel     Component Value Date/Time   CHOL 238 (H) 09/06/2017 0308   TRIG 174 (H) 09/06/2017 0308   HDL 37 (L) 09/06/2017 0308   CHOLHDL 6.4 09/06/2017 0308   VLDL 35 09/06/2017 0308   LDLCALC 166 (H) 09/06/2017 0308   LDLDIRECT 190.0 01/02/2009 1101     Medications Reviewed Today    Reviewed by [name], RN (Registered Nurse) on 10/20/17 at 1152  Med List Status: <None>  Medication Order Taking? Sig Documenting Provider Last Dose Status Informant  albuterol (PROVENTIL HFA;VENTOLIN HFA) 108 (90 Base) MCG/ACT inhaler [id number] Yes Inhale 2 puffs into the lungs every 6 (six) hours as needed for wheezing or shortness of breath. [name], MD Taking Active Self           Med Note Kary Kos, Blair Heys   Fri Jul 11, 2017  2:19 PM) Patient needs a new prescription  aspirin EC 81 MG tablet [id number] Yes Take 81 mg by mouth daily. [provider] Taking Active Self  azelastine (ASTELIN) 0.1 % nasal spray [id number] Yes 1 spray 2 (two) times daily. Use in each nostril as directed [provider] Taking Active   bisoprolol (ZEBETA) 5 MG tablet [id number] No Take 1 tablet (5 mg total) by mouth daily.  Patient not taking:  Reported on 10/15/2017   Tanda Rockers, MD Not Taking Active   calcium carbonate (OSCAL) 1500 (600 Ca) MG TABS tablet [id number] Yes Take 600 mg of elemental calcium by mouth daily with breakfast. [provider] Taking Active Self  donepezil (ARICEPT) 10 MG tablet [id number] No  Take 10 mg by mouth at bedtime. [provider] Not Taking Active   ezetimibe (ZETIA) 10 MG tablet [id number] Yes Take 10 mg by mouth daily.  [provider] Taking Active Self  fluticasone (FLONASE) 50 MCG/ACT nasal spray [id number] Yes USE TWO SPRAYS INTO BOTH NOSTRILS DAILY [name], MD Taking Active Self  fluticasone furoate-vilanterol (BREO ELLIPTA) 100-25 MCG/INH AEPB [id number] Yes Inhale 1 puff into the lungs daily. [name], MD Taking Active  Self  furosemide (LASIX) 80 MG tablet [id number] Yes Take 1 tablet (80 mg total) by mouth daily.  Patient taking differently:  Take 80 mg by mouth 2 (two) times daily.    Mendel Corning, MD Taking Active Self  gabapentin (NEURONTIN) 100 MG capsule [id number] Yes Take 1 capsule (100 mg total) by mouth 2 (two) times daily. Modena Jansky, MD Taking Active   guaiFENesin (MUCINEX) 600 MG 12 hr tablet [id number] Yes Take 2 tablets (1,200 mg total) by mouth 2 (two) times daily as needed for cough or to loosen phlegm. [name], MD Taking Active Self  hydrALAZINE (APRESOLINE) 100 MG tablet [id number] Yes Take 100 mg by mouth 2 (two) times daily. [provider] Taking Active   insulin aspart (NOVOLOG) 100 UNIT/ML injection [id number] Yes Inject 0-9 Units into the skin 3 (three) times daily with meals. CBG < 70: implement hypoglycemia protocol CBG 70 - 120: 0 units CBG 121 - 150: 1 unit CBG 151 - 200: 2 units CBG 201 - 250: 3 units CBG 251 - 300: 5 units CBG 301 - 350: 7 units CBG 351 - 400: 9 units CBG > 400: call MD. Modena Jansky, MD Taking Active   insulin aspart (NOVOLOG) 100 UNIT/ML injection [id number] Yes Inject 5 Units into the skin 3 (three) times daily with meals. Modena Jansky, MD Taking Active   LEVEMIR FLEXTOUCH 100 UNIT/ML Pen [id number] Yes Inject 18 Units into the skin daily. Modena Jansky, MD Taking Active            Med Note Orene Desanctis, MONICA   Mon Oct 20, 2017 11:49 AM) 15 units daily  losartan (COZAAR) 25 MG tablet [id number] No Take 25 mg by mouth daily. [provider] Not Taking Active   meclizine (ANTIVERT) 25 MG tablet [id number] Yes Take 1 tablet (25 mg total) by mouth 3 (three) times daily as needed for dizziness. Julianne Rice, MD Taking Active Self  montelukast (SINGULAIR) 10 MG tablet [id number] Yes TAKE ONE TABLET BY MOUTH ONCE DAILY  Patient taking differently:  TAKE ONE TABLET BY MOUTH AT BEDTIME   [name], MD Taking Active Self   nitroGLYCERIN (NITROSTAT) 0.4 MG SL tablet [id number] Yes PLACE ONE TABLET UNDER THE TONGUE EVERY FIVE MINUTES AS NEEDED FOR CHEST PAIN Minus Breeding, MD Taking Active Self           Med Note (CAULFIELD, ASHLEY L   Mon Oct 06, 2017  9:28 AM)    nystatin (NYSTATIN) powder [id number] Yes Apply topically 4 (four) times daily. Apply to abdominal fold daily PRN [provider] Taking Active   omeprazole (PRILOSEC) 20 MG capsule [id number]  Take 1 capsule (20 mg total) by mouth 2 (two) times daily before a meal. Armbruster, Carlota Raspberry, MD  Expired 09/05/17 2359 Self  OXYGEN [id number] Yes Inhale 2 L into the lungs continuous. CPAP with oxygen at bedtime  [provider] Taking Active Self  raloxifene (EVISTA) 60 MG tablet [id number] Yes Take 60 mg by mouth every morning.  [provider] Taking Active Self  sucralfate (CARAFATE) 1 g tablet [id number] Yes Take 1 g by mouth 2 (two) times daily. [provider] Taking Active   TRUEPLUS PEN NEEDLES 32G X 4 MM MISC [id number]  INJECT THREE TIMES A DAY AS DIRECTED [provider]  Active Self           Med Note Kary Kos, JULIE E   Fri Jul 11, 2017  2:21 PM) With Levemir  valACYclovir (VALTREX) 500 MG tablet [id number]  Take 500 mg by mouth 3 (three) times daily. [provider]  Active   Vitamin D, Ergocalciferol, (DRISDOL) 50000 UNITS CAPS capsule [id number]  Take 50,000 Units by mouth every Monday.  [provider]  Active Self          Assessment:  Drugs sorted by system:  Neurologic/Psychologic: donepezil, gabapentin, meclizine  Cardiovascular: aspirin 81mg , bisoprolol, ezetimibe, furosemide, hydralazine, losartan, SL NTG  Pulmonary/Allergy: albutetrol, breo ellipta, azelastine NS, fluticasone NS, guaifenesin, singulair  Gastrointestinal: omperazole, sucralfate  Endocrine: insulin aspart (Novolog), insulin determir (levemir)  Topical:nepafenac eye drops, nystatin powder  Infectious  Diseases: amoxicillin, valacyclovir  Vitamins/Minerals/Supplements: calcium carbonate, vitamin D  Miscellaneous: raloxifene  Medication Review Findings:  . Poor medication adherence as evidenced by multiple expired bottles of medications in the home and per report from caregiver.  Patient has switched pharmacies recently and has had multiple medication changes during hospitalization and SNF stay in August - September.  Patient will greatly benefit from having medications put in compliance packs to reduce confusion on how to organize and take medications and to improve accuracy with medication regimen with changes in therapy.   o Furosemide 80mg  BID in home but instructions in [facility] are qDAY o Isosorbide in home but not on medication list o Doxazosin in home but not on medication list o Bisoprolol and losartan - caregiver unable to find medications o Many medications due for refills o Many PRN medications that patient unaware to use, unclear if eye drops and nasal sprays are current medications     Care coordination call placed to [place].  Patient information provided to pharmacy who will await receipt of new prescriptions from providers to initiate weekly pill packs next week with free delivery to patient's home.   [facility]  All automatic refills removed from profile.    Updated [facility] as preferred pharmacy in [facility].   Care coordination call placed to Dr. Lina Sar office [facility]) to request new prescriptions be sent to [facility] after patient's appointment on Thursday.  Message left with Darlene.   Note routed to Wyn Quaker, NP, at pulmonary office to also request new prescriptions be sent to [facility].   Plan: Will follow-up with [facility] on Friday to ensure new prescriptions are sent in for compliance packs.    Ralene Bathe, PharmD, [place] [phone]

## 2017-10-22 ENCOUNTER — Ambulatory Visit (INDEPENDENT_AMBULATORY_CARE_PROVIDER_SITE_OTHER): Payer: Medicare Other | Admitting: Pulmonary Disease

## 2017-10-22 ENCOUNTER — Encounter: Payer: Self-pay | Admitting: Pulmonary Disease

## 2017-10-22 VITALS — BP 180/94 | HR 63 | Ht 66.0 in | Wt 268.0 lb

## 2017-10-22 DIAGNOSIS — I10 Essential (primary) hypertension: Secondary | ICD-10-CM | POA: Diagnosis not present

## 2017-10-22 DIAGNOSIS — G4733 Obstructive sleep apnea (adult) (pediatric): Secondary | ICD-10-CM | POA: Diagnosis not present

## 2017-10-22 DIAGNOSIS — Z79899 Other long term (current) drug therapy: Secondary | ICD-10-CM

## 2017-10-22 DIAGNOSIS — Z789 Other specified health status: Secondary | ICD-10-CM | POA: Diagnosis not present

## 2017-10-22 DIAGNOSIS — Z0000 Encounter for general adult medical examination without abnormal findings: Secondary | ICD-10-CM

## 2017-10-22 DIAGNOSIS — E1122 Type 2 diabetes mellitus with diabetic chronic kidney disease: Secondary | ICD-10-CM | POA: Diagnosis not present

## 2017-10-22 DIAGNOSIS — N183 Chronic kidney disease, stage 3 (moderate): Secondary | ICD-10-CM | POA: Diagnosis not present

## 2017-10-22 DIAGNOSIS — I130 Hypertensive heart and chronic kidney disease with heart failure and stage 1 through stage 4 chronic kidney disease, or unspecified chronic kidney disease: Secondary | ICD-10-CM | POA: Diagnosis not present

## 2017-10-22 DIAGNOSIS — J449 Chronic obstructive pulmonary disease, unspecified: Secondary | ICD-10-CM

## 2017-10-22 DIAGNOSIS — I2510 Atherosclerotic heart disease of native coronary artery without angina pectoris: Secondary | ICD-10-CM | POA: Diagnosis not present

## 2017-10-22 DIAGNOSIS — D631 Anemia in chronic kidney disease: Secondary | ICD-10-CM | POA: Diagnosis not present

## 2017-10-22 DIAGNOSIS — I5022 Chronic systolic (congestive) heart failure: Secondary | ICD-10-CM | POA: Diagnosis not present

## 2017-10-22 MED ORDER — ALBUTEROL SULFATE HFA 108 (90 BASE) MCG/ACT IN AERS: 2.0000 | INHALATION_SPRAY | Freq: Four times a day (QID) | RESPIRATORY_TRACT | 6 refills | Status: DC | PRN

## 2017-10-22 MED ORDER — MONTELUKAST SODIUM 10 MG PO TABS: 10.0000 mg | ORAL_TABLET | Freq: Every day | ORAL | 3 refills | Status: DC

## 2017-10-22 MED ORDER — FLUTICASONE FUROATE-VILANTEROL 100-25 MCG/INH IN AEPB: 1.0000 | INHALATION_SPRAY | Freq: Every day | RESPIRATORY_TRACT | 5 refills | Status: DC

## 2017-10-22 NOTE — Patient Instructions (Addendum)
Continue medications as prescribed Continue bisoprolol 5 mg twice daily Continue to not take carvedilol Continue to take losartan 25 mg twice daily Ensure you are taking Lasix 80 mg daily  Items to discuss with primary care tomorrow  Uncontrolled blood pressure  Recently stopped Carvedilol >>> replaced with Bisoprolol 5mg  twice daily   Recently increased Losartan 25mg  twice daily   Concern with Lasix 80mg  daily adherence based off of Pharmacy records   Continue to work/coordinate with [place] who completed a home visit this week  Refills for primary care medications in [facility] for [facility] to assist with medication management  [name] Pharmacist   Potential in-home help to help with medication management  Referral for memory care as patient's memory appears to have deficits  Continued follow-up for management of high blood pressure  Discussion of what goal blood pressure should be so tight he knows how to follow-up when checking during the week  Weekly follow-up with primary care to ensure her blood pressure becomes adequately controlled  We need you to resume using her CPAP.  We have placed an order with [facility] to get an SD card so that we can start getting reports on your compliance  We recommend that you continue using your CPAP daily >>>Keep up the hard work using your device >>> Goal should be wearing this for the entire night that you are sleeping, at least 4 to 6 hours  Remember:  . Do not drive or operate heavy machinery if tired or drowsy.  . Please notify the supply company and office if you are unable to use your device regularly due to missing supplies or machine being broken.  . Work on maintaining a healthy weight and following your recommended nutrition plan  . Maintain proper daily exercise and movement  . Maintaining proper use of your device can also help improve management of other chronic illnesses such  as: Blood pressure, blood sugars, and weight management.   BiPAP/ CPAP Cleaning:  >>>Clean weekly, with Dawn soap, and bottle brush.  Set up to air dry.   Follow-up in 1 to 2 weeks to address breathing as well as CPAP compliance   It is flu season:   >>>Remember to be washing your hands regularly, using hand sanitizer, be careful to use around herself with has contact with people who are sick will increase her chances of getting sick yourself. >>> Best ways to protect herself from the flu: Receive the yearly flu vaccine, practice good hand hygiene washing with soap and also using hand sanitizer when available, eat a nutritious meals, get adequate rest, hydrate appropriately    As of 11/24/2017 we will be moving! We will no longer be at our [place] location.   Our new address and phone number will be:  [address] [place], [place] [address] Telephone number: [phone]   Please contact the office if your symptoms worsen or you have concerns that you are not improving.   Thank you for choosing [facility] for your healthcare, and for allowing [place] to partner with you on your healthcare journey. I am thankful to be able to provide care to you today.   Wyn Quaker FNP-C

## 2017-10-22 NOTE — Progress Notes (Signed)
Jaclyn Shaggy,  Thanks for working with the patient.  I completed follow-up with the patient today.  I emphasized the importance that she follows up with her primary care doctor [name].  I also emphasized this importance with [name].  For right now were also emphasizing the patient only take her medications in front of [place].  As [name] has just been taking verbal word from the patient to ensure compliance.  This is clearly not an effective method.  Patient's blood pressure still elevated today.  I did not make medication changes today.  I explained to patient and will probably be medication changes tomorrow with primary care.  I suspect that part of the issues of the patient has not been taking her medications at all since we do not have confirmed compliance.  We will refill respiratory medications as instructed.  If you need anything else from [place] please let us know.  I emphasized the importance to the patient as well as [name] her caregiver that they keep close follow-up with primary care.  Emphasized to [place] that if blood pressure is elevated greater than 140/90 then he needs to be following up with primary care.  Wyn Quaker FNP  [facility]

## 2017-10-22 NOTE — Progress Notes (Signed)
[name],  I am not the patient's primary care provider we can definitely refill her respiratory medications as you have requested.  Patient needs to complete follow-up with primary care which is scheduled for tomorrow.  They can further manage her chronic medications.  As that would be more appropriate.  I will discuss this with the patient today with hopefully [name] her primary caretaker.    Please continue to support and work with the patient and emphasized the importance that she follows up with her primary care.  We have also sent a referral for the patient to be established with a different primary care at patient's request after last office visit.  Patient has uncontrolled blood pressure which we have been trying to acutely manage in a short term basis to avoid hospitilization until she is able to see primary care.   Please let me know if you have any other questions or concerns.  Wyn Quaker FNP  [facility]

## 2017-10-22 NOTE — Assessment & Plan Note (Addendum)
Continue medications as prescribed Continue bisoprolol 5 mg twice daily Continue to not take carvedilol Continue to take losartan 25 mg twice daily Ensure you are taking Lasix 80 mg daily  Items to discuss with primary care tomorrow  Uncontrolled blood pressure  Recently stopped Carvedilol >>> replaced with Bisoprolol 5mg  twice daily   Recently increased Losartan 25mg  twice daily   Concern with Lasix 80mg  daily adherence based off of Pharmacy records   Continue to work/coordinate with [place] who completed a home visit this week  Refills for primary care medications in order for [facility] to assist with medication management  [name] Pharmacist   Potential in-home help to help with medication management  Referral for memory care as patient's memory appears to have deficits  Continued follow-up for management of high blood pressure  Discussion of what goal blood pressure should be so tight he knows how to follow-up when checking during the week  Weekly follow-up with primary care to ensure her blood pressure becomes adequately controlled    Explained to patient as well as [name] her caregiver that if patient starts having worsening headaches, blurred vision, or more acute symptoms that she needs to present to emergency room for management of her blood pressure.

## 2017-10-22 NOTE — Telephone Encounter (Signed)
Patient in office today to see Wyn Quaker NP will follow up then.

## 2017-10-22 NOTE — Assessment & Plan Note (Addendum)
Continue medications as prescribed Continue bisoprolol 5 mg twice daily Continue to not take carvedilol Continue to take losartan 25 mg twice daily Ensure you are taking Lasix 80 mg daily  Make sure that [facility] your primary caregiver observes you take all of your medications  Items to discuss with primary care tomorrow  Uncontrolled blood pressure  Recently stopped Carvedilol >>> replaced with Bisoprolol 5mg  twice daily   Recently increased Losartan 25mg  twice daily   Concern with Lasix 80mg  daily adherence based off of Pharmacy records   Continue to work/coordinate with [name] who completed a home visit this week  Refills for primary care medications in order for [facility] to assist with medication management  [name] Pharmacist   Potential in-home help to help with medication management  Referral for memory care as patient's memory appears to have deficits  Continued follow-up for management of high blood pressure  Discussion of what goal blood pressure should be so tight he knows how to follow-up when checking during the week  Weekly follow-up with primary care to ensure her blood pressure becomes adequately controlled

## 2017-10-22 NOTE — Assessment & Plan Note (Signed)
We need you to resume using her CPAP.  We have placed an order with [facility] to get an SD card so that we can start getting reports on your compliance  We recommend that you continue using your CPAP daily >>>Keep up the hard work using your device >>> Goal should be wearing this for the entire night that you are sleeping, at least 4 to 6 hours  Remember:  . Do not drive or operate heavy machinery if tired or drowsy.  . Please notify the supply company and office if you are unable to use your device regularly due to missing supplies or machine being broken.  . Work on maintaining a healthy weight and following your recommended nutrition plan  . Maintain proper daily exercise and movement  . Maintaining proper use of your device can also help improve management of other chronic illnesses such as: Blood pressure, blood sugars, and weight management.   BiPAP/ CPAP Cleaning:  >>>Clean weekly, with Dawn soap, and bottle brush.  Set up to air dry.  Follow-up in 1 to 2 weeks to address breathing as well as CPAP compliance

## 2017-10-22 NOTE — Assessment & Plan Note (Signed)
Continue to work with [facility] Continue to work with [facility] Industrial/product designer  Check blood pressures daily and notify primary care blood pressures elevated above 140/90  Items to discuss with primary care tomorrow  Uncontrolled blood pressure  Recently stopped Carvedilol >>> replaced with Bisoprolol 5mg  twice daily   Recently increased Losartan 25mg  twice daily   Concern with Lasix 80mg  daily adherence based off of Pharmacy records   Continue to work/coordinate with Ray who completed a home visit this week  Refills for primary care medications in order for [facility] to assist with medication management  [name] Pharmacist   Potential in-home help to help with medication management  Referral for memory care as patient's memory appears to have deficits  Continued follow-up for management of high blood pressure  Discussion of what goal blood pressure should be so tight he knows how to follow-up when checking during the week  Weekly follow-up with primary care to ensure her blood pressure becomes adequately controlled

## 2017-10-22 NOTE — Assessment & Plan Note (Signed)
Continue Breo Ellipta 100 >>> Take 1 puff daily in the morning right when you wake up >>>Rinse your mouth out after use >>>This is a daily maintenance inhaler, NOT a rescue inhaler >>>Contact our office if you are having difficulties affording or obtaining this medication >>>It is important for you to be able to take this daily and not miss any doses  Only use your albuterol as a rescue medication to be used if you can't catch your breath by resting or doing a relaxed purse lip breathing pattern.  - The less you use it, the better it will work when you need it. - Ok to use up to 2 puffs  every 4 hours if you must but call for immediate appointment if use goes up over your usual need - Don't leave home without it !!  (think of it like the spare tire for your car)

## 2017-10-22 NOTE — Progress Notes (Signed)
Chart and office note reviewed in detail  > agree with a/p as outlined    

## 2017-10-22 NOTE — Assessment & Plan Note (Signed)
Continue medications as prescribed Continue bisoprolol 5 mg twice daily Continue to not take carvedilol Continue to take losartan 25 mg twice daily Ensure you are taking Lasix 80 mg daily  Items to discuss with primary care tomorrow  Uncontrolled blood pressure  Recently stopped Carvedilol >>> replaced with Bisoprolol 5mg  twice daily   Recently increased Losartan 25mg  twice daily   Concern with Lasix 80mg  daily adherence based off of Pharmacy records   Continue to work/coordinate with [place] who completed a home visit this week  Refills for primary care medications in order for [facility] to assist with medication management  [name] Pharmacist   Potential in-home help to help with medication management  Referral for memory care as patient's memory appears to have deficits  Continued follow-up for management of high blood pressure  Discussion of what goal blood pressure should be so tight he knows how to follow-up when checking during the week  Weekly follow-up with primary care to ensure her blood pressure becomes adequately controlled  We need you to resume using her CPAP.  We have placed an order with [facility] to get an SD card so that we can start getting reports on your compliance  We recommend that you continue using your CPAP daily >>>Keep up the hard work using your device >>> Goal should be wearing this for the entire night that you are sleeping, at least 4 to 6 hours  Remember:  . Do not drive or operate heavy machinery if tired or drowsy.  . Please notify the supply company and office if you are unable to use your device regularly due to missing supplies or machine being broken.  . Work on maintaining a healthy weight and following your recommended nutrition plan  . Maintain proper daily exercise and movement  . Maintaining proper use of your device can also help improve management of other chronic illnesses such  as: Blood pressure, blood sugars, and weight management.   BiPAP/ CPAP Cleaning:  >>>Clean weekly, with Dawn soap, and bottle brush.  Set up to air dry.  Follow-up in 1 to 2 weeks to address breathing as well as CPAP compliance

## 2017-10-23 ENCOUNTER — Inpatient Hospital Stay (HOSPITAL_COMMUNITY): Admission: RE | Admit: 2017-10-23 | Payer: Medicare Other | Source: Ambulatory Visit

## 2017-10-23 DIAGNOSIS — N179 Acute kidney failure, unspecified: Secondary | ICD-10-CM | POA: Diagnosis not present

## 2017-10-23 DIAGNOSIS — N183 Chronic kidney disease, stage 3 (moderate): Secondary | ICD-10-CM | POA: Diagnosis not present

## 2017-10-23 DIAGNOSIS — E7800 Pure hypercholesterolemia, unspecified: Secondary | ICD-10-CM | POA: Diagnosis not present

## 2017-10-23 DIAGNOSIS — E1121 Type 2 diabetes mellitus with diabetic nephropathy: Secondary | ICD-10-CM | POA: Diagnosis not present

## 2017-10-23 DIAGNOSIS — G4730 Sleep apnea, unspecified: Secondary | ICD-10-CM | POA: Diagnosis not present

## 2017-10-23 DIAGNOSIS — M7911 Myalgia of mastication muscle: Secondary | ICD-10-CM | POA: Diagnosis not present

## 2017-10-23 DIAGNOSIS — M791 Myalgia, unspecified site: Secondary | ICD-10-CM | POA: Diagnosis not present

## 2017-10-23 DIAGNOSIS — D638 Anemia in other chronic diseases classified elsewhere: Secondary | ICD-10-CM | POA: Diagnosis not present

## 2017-10-23 DIAGNOSIS — I10 Essential (primary) hypertension: Secondary | ICD-10-CM | POA: Diagnosis not present

## 2017-10-23 DIAGNOSIS — R413 Other amnesia: Secondary | ICD-10-CM | POA: Diagnosis not present

## 2017-10-24 ENCOUNTER — Encounter (HOSPITAL_COMMUNITY)
Admission: RE | Admit: 2017-10-24 | Discharge: 2017-10-24 | Disposition: A | Discharge status: Home / Self Care | Payer: Medicare Other | Source: Ambulatory Visit | Attending: Nephrology | Admitting: Nephrology

## 2017-10-24 ENCOUNTER — Other Ambulatory Visit: Payer: Self-pay | Admitting: Pharmacist

## 2017-10-24 VITALS — BP 173/78 | HR 63 | Temp 98.7°F | Resp 20

## 2017-10-24 DIAGNOSIS — D631 Anemia in chronic kidney disease: Secondary | ICD-10-CM | POA: Insufficient documentation

## 2017-10-24 DIAGNOSIS — Z79899 Other long term (current) drug therapy: Secondary | ICD-10-CM | POA: Insufficient documentation

## 2017-10-24 DIAGNOSIS — E1122 Type 2 diabetes mellitus with diabetic chronic kidney disease: Secondary | ICD-10-CM | POA: Diagnosis not present

## 2017-10-24 DIAGNOSIS — N183 Chronic kidney disease, stage 3 unspecified: Secondary | ICD-10-CM

## 2017-10-24 DIAGNOSIS — I2510 Atherosclerotic heart disease of native coronary artery without angina pectoris: Secondary | ICD-10-CM | POA: Diagnosis not present

## 2017-10-24 DIAGNOSIS — I5022 Chronic systolic (congestive) heart failure: Secondary | ICD-10-CM | POA: Diagnosis not present

## 2017-10-24 DIAGNOSIS — Z5181 Encounter for therapeutic drug level monitoring: Secondary | ICD-10-CM | POA: Diagnosis not present

## 2017-10-24 DIAGNOSIS — I130 Hypertensive heart and chronic kidney disease with heart failure and stage 1 through stage 4 chronic kidney disease, or unspecified chronic kidney disease: Secondary | ICD-10-CM | POA: Diagnosis not present

## 2017-10-24 LAB — RENAL FUNCTION PANEL
Albumin: 3 g/dL — ABNORMAL LOW (ref 3.5–5.0)
Anion gap: 11 (ref 5–15)
BUN: 31 mg/dL — AB (ref 8–23)
CALCIUM: 8.7 mg/dL — AB (ref 8.9–10.3)
CO2: 25 mmol/L (ref 22–32)
CREATININE: 1.98 mg/dL — AB (ref 0.44–1.00)
Chloride: 109 mmol/L (ref 98–111)
GFR, EST AFRICAN AMERICAN: 28 mL/min — AB (ref >60)
GFR, EST NON AFRICAN AMERICAN: 24 mL/min — AB (ref >60)
Glucose, Bld: 252 mg/dL — ABNORMAL HIGH (ref 70–99)
PHOSPHORUS: 3.2 mg/dL (ref 2.5–4.6)
Potassium: 3.8 mmol/L (ref 3.5–5.1)
SODIUM: 145 mmol/L (ref 135–145)

## 2017-10-24 LAB — IRON AND TIBC
IRON: 82 ug/dL (ref 28–170)
Saturation Ratios: 43 % — ABNORMAL HIGH (ref 10.4–31.8)
TIBC: 189 ug/dL — ABNORMAL LOW (ref 250–450)
UIBC: 107 ug/dL

## 2017-10-24 LAB — FERRITIN: FERRITIN: 558 ng/mL — AB (ref 11–307)

## 2017-10-24 LAB — POCT HEMOGLOBIN-HEMACUE: HEMOGLOBIN: 10 g/dL — AB (ref 12.0–15.0)

## 2017-10-24 MED ORDER — EPOETIN ALFA 20000 UNIT/ML IJ SOLN
INTRAMUSCULAR | Status: AC
  Administered 2017-10-24: 20000 [IU] via SUBCUTANEOUS
  Filled 2017-10-24: qty 1

## 2017-10-24 MED ORDER — EPOETIN ALFA 10000 UNIT/ML IJ SOLN
INTRAMUSCULAR | Status: AC
  Administered 2017-10-24: 10000 [IU] via SUBCUTANEOUS
  Filled 2017-10-24: qty 1

## 2017-10-24 MED ORDER — EPOETIN ALFA 40000 UNIT/ML IJ SOLN: 30000.0000 [IU] | INTRAMUSCULAR | Status: DC

## 2017-10-24 NOTE — Patient Outreach (Signed)
[facility]) Care Management  10/24/2017  Joann Gutierrez January 28, 1945 [id number]  Care coordination call to [name].  No prescriptions received from Dr. Lina Sar office yet.  Pulmonary meds have been received.  2nd phone call placed to Dr. Lina Sar office requesting new medications to be sent in.   I will follow-up with pharmacy next week.   Ralene Bathe, PharmD, [place] [phone]

## 2017-10-25 LAB — PTH, INTACT AND CALCIUM
Calcium, Total (PTH): 8.7 mg/dL (ref 8.7–10.3)
PTH: 99 pg/mL — ABNORMAL HIGH (ref 15–65)

## 2017-10-27 ENCOUNTER — Telehealth: Payer: Self-pay | Admitting: *Deleted

## 2017-10-27 ENCOUNTER — Other Ambulatory Visit: Payer: Self-pay | Admitting: *Deleted

## 2017-10-27 ENCOUNTER — Other Ambulatory Visit: Payer: Self-pay | Admitting: Pharmacist

## 2017-10-27 NOTE — Telephone Encounter (Signed)
-----   Message from Lauraine Rinne, NP sent at 10/23/2017 10:31 PM EDT ----- Can we follow-up the patient to ensure that follow-up with primary care?  Also need to see how blood pressures have been running.  We also need to ensure that the patient is taking her medications in front of [place] and that [name] is visualizing the patient take the medications.  Aaron Edelman

## 2017-10-27 NOTE — Patient Outreach (Signed)
[facility]) Care Management  10/27/2017  Joann Gutierrez 09/18/1945 [id number]   Call placed to [facility] caregiver, [name], to follow up on current healht status.Marland Kitchen  He report member has been doing well.  Saw both pulmonologist and primary MD last week.  Has been monitoring blood pressures, 155/80s - Saturday, 140/70s - Sunday, Saturday was highest over the last week.  He confirms that he has been in contact with [facility] pharmacist and working on pill packaging to increase adherence and decrease error.  Report they will have them delivered.  Denies any urgent concerns at this time, advised to contact this care manager with questions.  Will follow up next week.  THN CM Care Plan Problem One     Most Recent Value  Care Plan Problem One  Risk for readmission as evidenced by recent hospitalization requiring SNF stay.  Role Documenting the Problem One  Care Management [place] for Problem One  Active  THN Long Term Goal   Patient will have no acute care admission for the next 31 days.  THN Long Term Goal Start Date  10/07/17  Interventions for Problem One Long Term Goal  Caregiver educated on importance of monitoring blood pressure as well as blood sugars given recent hypertension.  Confirmed member has working blood pressure monitor.  THN CM Short Term Goal #1   Member will have follow up appointment with primary MD within the next 4 weeks  THN CM Short Term Goal #1 Start Date  10/07/17  [facility] CM Short Term Goal #1 Met Date  10/27/17  THN CM Short Term Goal #2   Member will report taking medications as instructed over the next 4 weeks  THN CM Short Term Goal #2 Start Date  10/07/17  Interventions for Short Term Goal #2  Confirmed with caregiver that he has been in contact with [facility] pharmacist.  Educated on importance of close follow up with pharmacist in effort to decrease risk of medication error.     Joann Gutierrez, [place], MSN [place] [phone]

## 2017-10-27 NOTE — Patient Outreach (Signed)
[facility]) Care Management [place] 10/27/2017  Joann Gutierrez 06-09-1945 [id number]  Care coordination call to [facility]. Several medications called into pharmacy by Dr. Lina Sar office however many discrepancies from current medication list in [facility].   -No Aricept -No Evista -Furosemide BID vs qday -Carvedilol called in however changed to bisoprolol in Sept  Message left with Dr. Lina Sar office requesting clarification on these issues.   Plan: Await call back from office  Ralene Bathe, PharmD, [place] [phone]

## 2017-10-27 NOTE — Progress Notes (Signed)
[facility] Note  10/28/2017     CHIEF COMPLAINT Patient presents for Retina Follow Up   HISTORY OF PRESENT ILLNESS: Joann Gutierrez is a 72 y.o. female who presents to the clinic today for:   HPI    Retina Follow Up    Patient presents with  Diabetic Retinopathy.  In left eye.  Severity is moderate.  Duration of 4 months.  Since onset it is stable.  I, the attending physician,  performed the HPI with the patient and updated documentation appropriately.          Comments    Pt presents for f/u for diabetic retinopathy, pt states her vision fluctuates, there are days when she can see okay and other days her vision seems extremely blurry, pt states every once in awhile she sees floaters, but denies FOL and pain, pt denies the use of gtts, pt has not had A1C checked recently and she did not check her blood sugar this morning       Last edited by Bernarda Caffey, MD on 10/28/2017 12:49 PM. (History)      Referring physician: Seward Carol, MD [address], [place] [address]  HISTORICAL INFORMATION:   Selected notes from the MEDICAL RECORD NUMBER Referral from Dr. Read Drivers for r/o angiographic CME Ocular Hx - HZO OS; Pseudophakia OS (07/10/16); cellophane OS; HTN; PMH - DM II on insulin; Hx stroke;    CURRENT MEDICATIONS: Current Outpatient Medications (Ophthalmic Drugs)  Medication Sig  . nepafenac (ILEVRO) 0.3 % ophthalmic suspension 1 drop daily.   No current facility-administered medications for this visit.  (Ophthalmic Drugs)   Current Outpatient Medications (Other)  Medication Sig  . albuterol (PROVENTIL HFA;VENTOLIN HFA) 108 (90 Base) MCG/ACT inhaler Inhale 2 puffs into the lungs every 6 (six) hours as needed for wheezing or shortness of breath.  Marland Kitchen amoxicillin (AMOXIL) 125 MG chewable tablet Chew 125 mg by mouth 2 (two) times daily.  Marland Kitchen aspirin EC 81 MG tablet Take 81 mg by mouth daily.  Marland Kitchen azelastine (ASTELIN) 0.1  % nasal spray 1 spray 2 (two) times daily. Use in each nostril as directed  . bisoprolol (ZEBETA) 5 MG tablet Take 1 tablet (5 mg total) by mouth daily.  . calcium carbonate (OSCAL) 1500 (600 Ca) MG TABS tablet Take 600 mg of elemental calcium by mouth daily with breakfast.  . donepezil (ARICEPT) 10 MG tablet Take 10 mg by mouth at bedtime.  Marland Kitchen ezetimibe (ZETIA) 10 MG tablet Take 10 mg by mouth daily.   . fluticasone (FLONASE) 50 MCG/ACT nasal spray USE TWO SPRAYS INTO BOTH NOSTRILS DAILY  . fluticasone furoate-vilanterol (BREO ELLIPTA) 100-25 MCG/INH AEPB Inhale 1 puff into the lungs daily.  . furosemide (LASIX) 80 MG tablet Take 1 tablet (80 mg total) by mouth daily. (Patient taking differently: Take 80 mg by mouth 2 (two) times daily. )  . gabapentin (NEURONTIN) 100 MG capsule Take 1 capsule (100 mg total) by mouth 2 (two) times daily.  Marland Kitchen guaiFENesin (MUCINEX) 600 MG 12 hr tablet Take 2 tablets (1,200 mg total) by mouth 2 (two) times daily as needed for cough or to loosen phlegm.  . hydrALAZINE (APRESOLINE) 100 MG tablet Take 100 mg by mouth 2 (two) times daily.  . insulin aspart (NOVOLOG) 100 UNIT/ML injection Inject 0-9 Units into the skin 3 (three) times daily with meals. CBG < 70: implement hypoglycemia protocol CBG 70 - 120: 0 units CBG 121 -  150: 1 unit CBG 151 - 200: 2 units CBG 201 - 250: 3 units CBG 251 - 300: 5 units CBG 301 - 350: 7 units CBG 351 - 400: 9 units CBG > 400: call MD.  . insulin aspart (NOVOLOG) 100 UNIT/ML injection Inject 5 Units into the skin 3 (three) times daily with meals.  Marland Kitchen LEVEMIR FLEXTOUCH 100 UNIT/ML Pen Inject 18 Units into the skin daily.  Marland Kitchen losartan (COZAAR) 25 MG tablet Take 25 mg by mouth daily.  . meclizine (ANTIVERT) 25 MG tablet Take 1 tablet (25 mg total) by mouth 3 (three) times daily as needed for dizziness.  . montelukast (SINGULAIR) 10 MG tablet Take 1 tablet (10 mg total) by mouth daily.  . nitroGLYCERIN (NITROSTAT) 0.4 MG SL tablet PLACE  ONE TABLET UNDER THE TONGUE EVERY FIVE MINUTES AS NEEDED FOR CHEST PAIN  . nystatin (NYSTATIN) powder Apply topically 4 (four) times daily. Apply to abdominal fold daily PRN  . omeprazole (PRILOSEC) 20 MG capsule Take 20 mg by mouth 2 (two) times daily before a meal.  . OXYGEN Inhale 2 L into the lungs continuous. CPAP with oxygen at bedtime   . raloxifene (EVISTA) 60 MG tablet Take 60 mg by mouth every morning.   . sucralfate (CARAFATE) 1 g tablet Take 1 g by mouth 2 (two) times daily.  . TRUEPLUS PEN NEEDLES 32G X 4 MM MISC INJECT THREE TIMES A DAY AS DIRECTED  . valACYclovir (VALTREX) 500 MG tablet Take 500 mg by mouth 3 (three) times daily.  . Vitamin D, Ergocalciferol, (DRISDOL) 50000 UNITS CAPS capsule Take 50,000 Units by mouth every Monday.    No current facility-administered medications for this visit.  (Other)      REVIEW OF SYSTEMS: ROS    Positive for: Musculoskeletal, Endocrine, Cardiovascular, Eyes, Psychiatric, Allergic/Imm   Negative for: Constitutional, Gastrointestinal, Neurological, Skin, Genitourinary, HENT, Respiratory, Heme/Lymph   Last edited by Debbrah Alar, COT on 10/28/2017  8:54 AM. (History)       ALLERGIES Allergies  Allergen Reactions  . Morphine And Related Other (See Comments)    Family request not to be given, reports pt does not wake up when given   . Promethazine Hcl Other (See Comments)    REACTION: lethargy    PAST MEDICAL HISTORY Past Medical History:  Diagnosis Date  . Adenomatous colon polyp   . Allergy   . Anemia   . Asthma       . CAD (coronary artery disease)    Mild very minimal coronary disease with 20% obtuse marginal stenosis  . Carpal tunnel syndrome on left   . CHF (congestive heart failure) ([place])   . Chronic kidney disease (CKD), stage III (moderate) (HCC)   . COPD (chronic obstructive pulmonary disease) ([place])   . CVA (cerebral infarction)   . Depression   . 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 Diverticulosis   .  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 Esophageal dysmotility   . Esophageal stricture   . Gastritis   . Gastroparesis 08/21/2007  . GERD (gastroesophageal reflux disease)   . Hair loss   . Hearing loss of both ears   . Hernia, hiatal   . Hyperlipidemia   . Hypertension   . Morbid obesity ([place])   . Obesity   . Osteoarthritis   . OSTEOARTHRITIS 08/09/2006  . Osteoporosis   . 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 RENAL INSUFFICIENCY 02/16/2009  . 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 Seizures ([place])    pt thinks it has been several monthes since she had a seisure  . Shingles   . Sickle cell trait ([place])   . Stroke ([place])   . Tubular adenoma of colon    Past Surgical History:  Procedure Laterality Date  . ABDOMINAL HYSTERECTOMY    . ARTERY BIOPSY  01/07/2011   Procedure: MINOR BIOPSY TEMPORAL ARTERY;  Surgeon: Haywood Lasso, MD;  Location: [place];  Service: General;  Laterality: Left;  left temporal artery biopsy  . bil foot surgery    . BREAST LUMPECTOMY     benign  . BREAST LUMPECTOMY     both breast lumps removed   . CATARACT EXTRACTION Left 06/2016   Dr. Read Drivers  . COLONOSCOPY    . COLONOSCOPY WITH PROPOFOL N/A 07/05/2015   Procedure: COLONOSCOPY WITH PROPOFOL;  Surgeon: Manus Gunning, MD;  Location: [facility];  Service: Gastroenterology;  Laterality: N/A;  . ERD  08/08/2000  . ESOPHAGEAL MANOMETRY N/A 03/13/2015   Procedure: ESOPHAGEAL MANOMETRY (EM);  Surgeon: Manus Gunning, MD;  Location: [facility];  Service: Gastroenterology;  Laterality: N/A;  . ESOPHAGOGASTRODUODENOSCOPY  06/25/2006  . ESOPHAGOGASTRODUODENOSCOPY (EGD) WITH PROPOFOL N/A 07/05/2015   Procedure: ESOPHAGOGASTRODUODENOSCOPY (EGD) WITH PROPOFOL;  Surgeon: Manus Gunning, MD;  Location: [facility];  Service: Gastroenterology;  Laterality: N/A;  . HERNIA REPAIR    . LEFT AND RIGHT HEART CATHETERIZATION WITH CORONARY ANGIOGRAM N/A  03/03/2013   Procedure: LEFT AND RIGHT HEART CATHETERIZATION WITH CORONARY ANGIOGRAM;  Surgeon: Minus Breeding, MD;  Location: [facility] CATH LAB;  Service: Cardiovascular;  Laterality: N/A;  . REPLACEMENT TOTAL KNEE Left 1998  . UPPER GASTROINTESTINAL ENDOSCOPY      FAMILY HISTORY Family History  Problem Relation Age of Onset  . Liver cancer Mother        Liver Cancer  . Diabetes Mother   . Kidney disease Mother   . Heart disease Mother        age 53's  . Heart disease Father 37       MI  . Heart attack Father        died of MI when pt was 61  . Sickle cell anemia Father   . Colon cancer Brother   . Cancer Brother        Colon Cancer  . Diabetes Sister   . Kidney disease Sister   . Heart disease Sister        age 66's  . Allergies Sister   . Diabetes Sister   . Kidney disease Sister   . Heart disease Sister        age 62's  . Esophageal cancer Neg Hx   . Rectal cancer Neg Hx   . Stomach cancer Neg Hx   . Amblyopia Neg Hx   . Blindness Neg Hx   . Glaucoma Neg Hx   . Macular degeneration Neg Hx   . Retinal detachment Neg Hx   . Cataracts Neg Hx   . Strabismus Neg Hx   . Retinitis pigmentosa Neg Hx     SOCIAL HISTORY Social History   Tobacco Use  . Smoking status: Former Smoker    Packs/day: 0.50    Years: 10.00    Pack years: 5.00    Last attempt to quit: 04/18/1980    Years since quitting: 37.5  . Smokeless tobacco: Never Used  Substance Use Topics  . Alcohol use: No  Alcohol/week: 0.0 standard drinks  . Drug use: No         OPHTHALMIC EXAM:  Base Eye Exam    Visual Acuity (Snellen - Linear)      Right Left   Dist cc 20/60 20/60   Dist ph cc NI 20/50   Correction:  Glasses       Tonometry (Tonopen, 9:01 AM)      Right Left   Pressure 19 22       Pupils      Dark Light Shape React APD   Right 2 1.5 Round Minimal None   Left 2 1.5 Round Minimal None       Visual Fields (Counting fingers)      Left Right    Full Full       Extraocular  Movement      Right Left    Full, Ortho Full, Ortho       Neuro/Psych    Oriented x3:  Yes   Mood/Affect:  Normal       Dilation    Both eyes:  1.0% Mydriacyl, 2.5% Phenylephrine @ 9:01 AM        Slit Lamp and Fundus Exam    External Exam      Right Left   External Normal Normal       Slit Lamp Exam      Right Left   Lids/Lashes Dermatochalasis - upper lid, mild Meibomian gland dysfunction Dermatochalasis - upper lid, Meibomian gland dysfunction   Conjunctiva/Sclera melanosis  Nasal Pinguecula   Cornea Arcus, 1+ Punctate epithelial erosions well healed temporal insision, 1+ Punctate epithelial erosions   Anterior Chamber Moderate depth and quiet, narrow temporal angle Deep and quiet   Iris Round and dilated; no NVI Round and dilated, No NVI   Lens 2-3+ Nuclear sclerosis with mild brunescence, 2-3+ Cortical cataract, +Vacuoles Posterior chamber intraocular lens in good position    Vitreous Vitreous syneresis Vitreous syneresis - mild;       Fundus Exam      Right Left   Disc Pink and Sharp, +SVP Mild temporal Peripapillary atrophy/pigmentation   C/D Ratio 0.45 0.55   Macula Good foveal reflex, Flat; trace MA, Retinal pigment epithelial mottling, No heme or edema Blunted foveal reflex, Trace ERM; trace MA; mild RPE changes;    Vessels Vascular attenuation; AV crossing changes, Copper wiring, mildy Tortuous AV crossing changes; mild venous dilation, Copper wiring   Periphery Attached attached; SN blot hemes          IMAGING AND PROCEDURES  Imaging and Procedures for 05/08/17  OCT, Retina - OU - Both Eyes       Right Eye Quality was good. Central Foveal Thickness: 240. Progression has been stable. Findings include normal foveal contour, no IRF, no SRF.   Left Eye Quality was good. Central Foveal Thickness: 257. Progression has been stable. Findings include normal foveal contour, no SRF, no IRF (Trace cystic changes ST macula).   Notes Images taken, stored on  drive  Diagnosis / Impression:  No macular edema, IRF, SRF OU  Clinical management:  See below  Abbreviations: NFP - Normal foveal profile. CME - cystoid macular edema.  PED - pigment epithelial detachment. IRF - intraretinal fluid. SRF -  subretinal fluid. EZ - ellipsoid zone. ERM - epiretinal membrane. ORA -  outer retinal atrophy. ORT - outer retinal tubulation. SRHM - subretinal  hyper-reflective material        Fluorescein Angiography [place] (Transit OS)  Right Eye   Progression has improved. Early phase findings include microaneurysm. Mid/Late phase findings include microaneurysm, leakage.   Left Eye   Progression has been stable. Early phase findings include microaneurysm. Mid/Late phase findings include leakage, microaneurysm.   Notes Images stored on drive;   Impression: Mild-moderate NPDR OU No CME or hyperfluorescence of disc                  ASSESSMENT/PLAN:    ICD-10-CM   1. Mild nonproliferative diabetic retinopathy of both eyes without macular edema associated with type 2 diabetes mellitus ([place]) Z61.0960 Fluorescein Angiography [place] (Transit OS)  2. 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 Retinal edema H35.81 OCT, Retina - OU - Both Eyes  4. Pseudophakia, left eye Z96.1   5. Senile nuclear sclerosis, right H25.11     1. Mild non-proliferative diabetic retinopathy, both eyes  - The incidence, risk factors for progression, natural history and treatment options for diabetic retinopathy  were discussed with patient.  The need for close monitoring of blood glucose, blood pressure, and serum lipids, avoiding cigarette or any type of tobacco, and the need for long term follow up was also discussed with patient.  - repeat FAs 2.27.19, 4.17.19, 10.8.19 show scattered MA with mild late leakage   - no frank DME  - monitor  2,3. 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 Pseudophakia OS  - IOL in good position -- beautiful surgery by Dr. Kathlen Mody  - no CME on OCT or FA -- management as above  - will continue to monitor  5. Senile nuclear sclerosis OD  - The symptoms of cataract, surgical options, and treatments and risks were discussed with patient.  - discussed diagnosis and progression  - perhaps some mild progression OD  - under the expert care of Dr. Kathlen Mody   - management per Dr. Kathlen Mody  - from a retina standpoint, okay to proceed with CE/IOL OD when pt and surgeon are ready -- OS appears to be stable at this time    Ophthalmic Meds Ordered this visit:  No orders of the defined types were placed in this encounter.      Return in about 6 months (around 04/29/2018) for F/U CME OS, DFE, OCT.  There are no Patient Instructions on file for this visit.   Explained the diagnoses, plan, and follow up with the patient and they expressed understanding.  Patient expressed understanding of the importance of proper follow up care.   This document serves as a record of services personally performed by Gardiner Sleeper, MD, PhD. It was created on their behalf by Ernest Mallick, OA, an ophthalmic assistant. The creation of this record is the provider's dictation and/or activities during the visit.    Electronically signed by: Ernest Mallick, OA  10.07.19 1:02 PM   This  document serves as a record of services personally  performed by Gardiner Sleeper, MD, PhD. It was created on their behalf by Catha Brow, [place], a certified ophthalmic assistant. The creation of this record is the provider's dictation and/or activities during the visit.  Electronically signed by: Catha Brow, COA  10.08.19 1:02 PM   Gardiner Sleeper, M.D., Ph.D. Diseases & Surgery of the Retina and Vitreous [facility]  I have reviewed the above documentation for accuracy and completeness, and I agree with the above. Gardiner Sleeper, M.D., Ph.D. 10/28/17 1:02 PM    Abbreviations: M myopia (nearsighted); A astigmatism; H hyperopia (farsighted); P presbyopia; Mrx spectacle prescription;  CTL contact lenses; OD right eye; OS left eye; OU both eyes  XT exotropia; ET esotropia; PEK punctate epithelial keratitis; PEE punctate epithelial erosions; DES dry eye syndrome; MGD meibomian gland dysfunction; ATs artificial tears; PFAT's preservative free artificial tears; [place] nuclear sclerotic cataract; PSC posterior subcapsular cataract; ERM epi-retinal membrane; PVD posterior vitreous detachment; RD retinal detachment; DM diabetes mellitus; DR diabetic retinopathy; NPDR non-proliferative diabetic retinopathy; PDR proliferative diabetic retinopathy; CSME clinically significant macular edema; DME diabetic macular edema; dbh dot blot hemorrhages; CWS cotton wool spot; POAG primary open angle glaucoma; C/D cup-to-disc ratio; HVF humphrey visual field; GVF goldmann visual field; OCT optical coherence tomography; IOP intraocular pressure; BRVO Branch retinal vein occlusion; CRVO central retinal vein occlusion; CRAO central retinal artery occlusion; BRAO branch retinal artery occlusion; RT retinal tear; SB scleral buckle; PPV pars plana vitrectomy; VH Vitreous hemorrhage; PRP panretinal laser photocoagulation; IVK intravitreal kenalog; VMT vitreomacular traction; MH Macular hole;  NVD neovascularization of the disc; NVE neovascularization  elsewhere; AREDS age related eye disease study; ARMD age related macular degeneration; POAG primary open angle glaucoma; EBMD epithelial/anterior basement membrane dystrophy; ACIOL anterior chamber intraocular lens; IOL intraocular lens; PCIOL posterior chamber intraocular lens; Phaco/IOL phacoemulsification with intraocular lens placement; [place] photorefractive keratectomy; LASIK laser assisted in situ keratomileusis; HTN hypertension; DM diabetes mellitus; COPD chronic obstructive pulmonary disease

## 2017-10-27 NOTE — Telephone Encounter (Signed)
Called and spoke with patient's caregiver [name]. States her blood pressures are controlled 140/90. And she saw her PCP [name] last Thursday.  Nothing further needed at this time.

## 2017-10-28 ENCOUNTER — Encounter (INDEPENDENT_AMBULATORY_CARE_PROVIDER_SITE_OTHER): Payer: Self-pay | Admitting: Ophthalmology

## 2017-10-28 ENCOUNTER — Other Ambulatory Visit: Payer: Self-pay | Admitting: Pharmacist

## 2017-10-28 ENCOUNTER — Ambulatory Visit (INDEPENDENT_AMBULATORY_CARE_PROVIDER_SITE_OTHER): Payer: Medicare Other | Admitting: Ophthalmology

## 2017-10-28 DIAGNOSIS — Z961 Presence of intraocular lens: Secondary | ICD-10-CM

## 2017-10-28 DIAGNOSIS — E113293 Type 2 diabetes mellitus with mild nonproliferative diabetic retinopathy without macular edema, bilateral: Secondary | ICD-10-CM

## 2017-10-28 DIAGNOSIS — H2511 Age-related nuclear cataract, right eye: Secondary | ICD-10-CM

## 2017-10-28 DIAGNOSIS — H30002 Unspecified focal chorioretinal inflammation, left eye: Secondary | ICD-10-CM

## 2017-10-28 DIAGNOSIS — H3581 Retinal edema: Secondary | ICD-10-CM | POA: Diagnosis not present

## 2017-10-28 NOTE — Progress Notes (Signed)
[name],  We have followed up with the patient as well as her caregiver [name].  It appears her blood pressure is more controlled.  Patient has also completed follow-up with primary care.  Just wanted to give you an update.  Wyn Quaker, FNP

## 2017-10-28 NOTE — Patient Outreach (Signed)
[facility]) Care Management  [place] 10/28/2017  NOLLIE SHIFLETT November 15, 1945 [id number]  Reason for referral: medication management, transition to compliance packs  Incoming call received from Dr. Lina Sar office. Medications clarified as follows:   -Evista - active -Aricept - active -Furosemide 80mg  qday -Bisoprolol active -Carvedilol discontinued  Call to [facility]  All medications called in except Aricept.  Also no Aspirin 81mg  prescription yet either.  Pharmacy will reach out to Dr. Lina Sar office for Aricept and Aspirin prescriptions.   Call to [facility], patient's caregiver.  [name] reports all other OTC medications can be given to patient separately.  He is aware pharmacy will contact him directly as soon as all prescriptions received so that delivery and start of compliance packs can be coordinated.    Plan: Follow-up with patient and pharmacy next week regarding medications.   Ralene Bathe, PharmD, [place] [phone]

## 2017-10-29 DIAGNOSIS — I2510 Atherosclerotic heart disease of native coronary artery without angina pectoris: Secondary | ICD-10-CM | POA: Diagnosis not present

## 2017-10-29 DIAGNOSIS — I5022 Chronic systolic (congestive) heart failure: Secondary | ICD-10-CM | POA: Diagnosis not present

## 2017-10-29 DIAGNOSIS — D631 Anemia in chronic kidney disease: Secondary | ICD-10-CM | POA: Diagnosis not present

## 2017-10-29 DIAGNOSIS — N183 Chronic kidney disease, stage 3 (moderate): Secondary | ICD-10-CM | POA: Diagnosis not present

## 2017-10-29 DIAGNOSIS — I130 Hypertensive heart and chronic kidney disease with heart failure and stage 1 through stage 4 chronic kidney disease, or unspecified chronic kidney disease: Secondary | ICD-10-CM | POA: Diagnosis not present

## 2017-10-29 DIAGNOSIS — E1122 Type 2 diabetes mellitus with diabetic chronic kidney disease: Secondary | ICD-10-CM | POA: Diagnosis not present

## 2017-10-29 NOTE — Progress Notes (Signed)
Anytime.  I think a big issue was that also we need to make sure the people are visualizing her take her medications.  I discussed this and emphasized that with the patient as well as [name].  I too am glad that the patient's blood pressure is more to control and back on track.  Thank you for your hard work on this  [place]

## 2017-10-31 DIAGNOSIS — I130 Hypertensive heart and chronic kidney disease with heart failure and stage 1 through stage 4 chronic kidney disease, or unspecified chronic kidney disease: Secondary | ICD-10-CM | POA: Diagnosis not present

## 2017-10-31 DIAGNOSIS — D631 Anemia in chronic kidney disease: Secondary | ICD-10-CM | POA: Diagnosis not present

## 2017-10-31 DIAGNOSIS — E1122 Type 2 diabetes mellitus with diabetic chronic kidney disease: Secondary | ICD-10-CM | POA: Diagnosis not present

## 2017-10-31 DIAGNOSIS — N183 Chronic kidney disease, stage 3 (moderate): Secondary | ICD-10-CM | POA: Diagnosis not present

## 2017-10-31 DIAGNOSIS — I5022 Chronic systolic (congestive) heart failure: Secondary | ICD-10-CM | POA: Diagnosis not present

## 2017-10-31 DIAGNOSIS — I2510 Atherosclerotic heart disease of native coronary artery without angina pectoris: Secondary | ICD-10-CM | POA: Diagnosis not present

## 2017-10-31 DIAGNOSIS — R739 Hyperglycemia, unspecified: Secondary | ICD-10-CM | POA: Diagnosis not present

## 2017-10-31 DIAGNOSIS — I10 Essential (primary) hypertension: Secondary | ICD-10-CM | POA: Diagnosis not present

## 2017-11-03 ENCOUNTER — Other Ambulatory Visit: Payer: Self-pay | Admitting: *Deleted

## 2017-11-03 ENCOUNTER — Other Ambulatory Visit: Payer: Self-pay | Admitting: Pharmacist

## 2017-11-03 NOTE — Progress Notes (Signed)
@Patient  ID: Joann Gutierrez, female    DOB: [birth year], 72 y.o.   MRN: [id number]  Chief Complaint  Patient presents with  . Follow-up    med management, htn, asthma     Referring provider: Seward Carol, MD  HPI:   72 year old female former smoker followed in our office for asthma, obstructive sleep apnea, obesity hypoventilation syndrome, elevated right hemidiaphragm (first noted November 2011)  PMH: CKD stage III, postherpetic neuralgia, GERD, gastroparesis, T2DM, esophageal dysmotility, morbid obesity Smoker/ Smoking History: Former smoker.  Quit in 1982.  5 pack years Maintenance:  Breo 100  Pt of: Dr. Melvyn Novas  11/04/2017  - Visit   72 year old female patient presenting today for follow-up visit.  Unfortunately patient's blood pressure is elevated again today.  Patient reports that she was recently seen last week by her primary care doctor on 10/31/2017 and blood pressure was controlled at that office visit.  Patient was sent to primary care as home health nurse recorded a blood pressure of 180/90.  Patient has home health nurse who comes by 2 times a week.  Patient is currently working [facility] to be established with medication packets to ensure medication adherence.  Unfortunately patient is still taking medications without the supervision of her caregiver [name].  [name] and the patient reports that they have conflicting schedule sometime so he is unable to view her take her medications.  This continues to be a problem.  Patient reports adherence to [facility] daily.  Patient reports that she has not had to use her rescue inhaler often only reporting 2 times that she is used it in the last 2 weeks.  CPAP compliance showing: Only 6 out of 30 days use with 1 day greater than 4 hours.  Average usage 2 hours and 52 minutes, CPAP set pressure of 9 with an AHI of 5.7. Patient reporting that she uses her CPAP every night.  Caregiver [name] reports that she does not  use it every night.  Patient would like flu vaccine today.       Tests:   PSG 04/28/09 >> AHI 11, SpO2 low 68% Spirometry 07/05/10 >> FEV1 1.30 (64%), FEV1% 80 >> coughing during test ONO with CPAP and room air 10/22/12 >> Test time 10 hrs 59 min. Mean SpO2 92.4%, low SpO2 75%. Spent 16 min with SpO2 < 89%. PFT 03/17/13 >> FEV1 1.27 (67%), FEV1% 84, TLC 4.06 (80%), DLCO 57%, borderline BD   Imaging:  10/24/2016-chest x-ray-no evidence of acute cardiopulmonary abnormality 11/27/2015-CT maxillofacial without contrast- inflammatory changes centered in the soft tissues of the left nasal alla and left nasolabial fold extending superior to left periorbital soft tissues.  Mild sphenoid and maxillary sinus mucosal thickening  09/08/17 - CT head - trace layering opacity within left sphenoid sinus, paranasal sinuses otherwise clear  Cardiac:  Echo 02/04/13 >> mod LVH, EF 65 to 40%, grade 1 diastolic dysfx, PAS 42 mmHg Echo 05/21/11 >> EF 55 to 60%, mild LVH, mild/mod TR, PAS 45 mmHg Lt, Rt Heart cath 03/03/13 >> RA 10, RV 38/7, PA 41/16 28, PCWP 13, LV 142/5, AO 137/76  Labs:  RAST 12/11/12 >> negative, IgE 63.3  FENO:  No results found for: NITRICOXIDE  PFT: PFT Results Latest Ref Rng & Units 03/17/2013  FVC-Pre L 1.44  FVC-Predicted Pre % 58  FVC-Post L 1.51  FVC-Predicted Post % 61  Pre FEV1/FVC % % 80  Post FEV1/FCV % % 84  FEV1-Pre L 1.15  FEV1-Predicted Pre %  60  DLCO UNC% % 57  DLCO COR %Predicted % 101    Imaging: Dg Chest 2 View  Result Date: 10/06/2017 CLINICAL DATA:  Cough, chest congestion, and wheezing for the past 5 days. History of asthma, former smoker. EXAM: CHEST - 2 VIEW COMPARISON:  Chest x-ray of September 04, 2017. FINDINGS: The lungs are adequately inflated. The right hemidiaphragm remains higher than the left. There is mildly increased density at the right lung base posteriorly which is less conspicuous than on the previous study. The left lung is clear. The heart  is top-normal in size. The pulmonary vascularity is normal. There is calcification in the wall of the aortic arch. The trachea is midline. IMPRESSION: Asthma-COPD. Right posterior basal atelectasis or pneumonia which has become somewhat less conspicuous than on the previous study. If it appears clinically that the patient's former symptoms have recurred, a repeat course of antibiotics may be indicated. A follow-up chest x-ray in 2-3 weeks is recommended. Electronically Signed   By: David  Martinique M.D.   On: 10/06/2017 11:10    Chart Review:    Specialty Problems      Pulmonary Problems   Chronic obstructive asthma ([place])    Spirometry 07/05/10 >> FEV1 1.30 (64%), FEV1% 80  - Spirometry 02/06/2017  FEV1 0.88 (49%)  Ratio 63 with f/v loop non-physiologic but min curvature in effort indep portion  - 02/06/2017  After extensive coaching inhaler device  effectiveness =    25% from a basline of 0           Obstructive sleep apnea    CPAP 9 cm H2O with 2 liters oxygen       Chronic respiratory failure (HCC)   Dyspnea    Spirometry 07/05/10 >> FEV1 1.30 (64%), FEV1% 80  -09/22/2014   Walked RA x one lap @ 185 stopped due to  Sob/ slow pace, no desat  - rec max gerd rx including reglan 09/22/2014 >>>       Cough   Acute asthmatic bronchitis    Spirometry 07/05/10 >> FEV1 1.30 (64%), FEV1% 80  05/20/2014 p extensive coaching HFA effectiveness =    50%       Throat congestion      Allergies  Allergen Reactions  . Morphine And Related Other (See Comments)    Family request not to be given, reports pt does not wake up when given   . Promethazine Hcl Other (See Comments)    REACTION: lethargy    Immunization History  Administered Date(s) Administered  . Influenza Split 10/16/2010, 11/10/2012  . Influenza Whole 01/02/2009, 11/02/2009, 10/31/2011  . Influenza, High Dose Seasonal PF 11/04/2017  . Influenza,inj,Quad PF,6+ Mos 09/24/2013, 10/16/2015  . Influenza-Unspecified 11/21/2016  .  Pneumococcal Conjugate-13 10/16/2015  . Pneumococcal Polysaccharide-23 10/22/2002, 10/21/2009, 09/12/2013, 09/07/2017  . Td 08/22/2002    Past Medical History:  Diagnosis Date  . Adenomatous colon polyp   . Allergy   . Anemia   . Asthma       . CAD (coronary artery disease)    Mild very minimal coronary disease with 20% obtuse marginal stenosis  . Carpal tunnel syndrome on left   . CHF (congestive heart failure) ([place])   . Chronic kidney disease (CKD), stage III (moderate) (HCC)   . COPD (chronic obstructive pulmonary disease) ([place])   . CVA (cerebral infarction)   . Depression   . 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 Diverticulosis   . Elevated diaphragm November  2011   Right side  . Esophageal dysmotility   . Esophageal stricture   . Gastritis   . Gastroparesis 08/21/2007  . GERD (gastroesophageal reflux disease)   . Hair loss   . Hearing loss of both ears   . Hernia, hiatal   . Hyperlipidemia   . Hypertension   . Morbid obesity ([name])   . Obesity   . Osteoarthritis   . OSTEOARTHRITIS 08/09/2006  . Osteoporosis   . 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 RENAL INSUFFICIENCY 02/16/2009  . 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 Seizures ([place])    pt thinks it has been several monthes since she had a seisure  . Shingles   . Sickle cell trait ([place])   . Stroke ([place])   . Tubular adenoma of colon     Tobacco History: Social History   Tobacco Use  Smoking Status Former Smoker  . Packs/day: 0.50  . Years: 10.00  . Pack years: 5.00  . Last attempt to quit: 04/18/1980  . Years since quitting: 37.5  Smokeless Tobacco Never Used   Counseling given: Not Answered  Continue to not smoke  Outpatient Encounter Medications as of 11/04/2017  Medication Sig  . albuterol (PROVENTIL HFA;VENTOLIN HFA) 108 (90 Base) MCG/ACT inhaler Inhale 2 puffs into the lungs every 6 (six) hours as needed for wheezing or shortness of breath.  Marland Kitchen aspirin EC 81 MG tablet Take 81 mg by  mouth daily.  Marland Kitchen azelastine (ASTELIN) 0.1 % nasal spray 1 spray 2 (two) times daily. Use in each nostril as directed  . bisoprolol (ZEBETA) 5 MG tablet Take 1 tablet (5 mg total) by mouth daily.  . calcium carbonate (OSCAL) 1500 (600 Ca) MG TABS tablet Take 600 mg of elemental calcium by mouth daily with breakfast.  . donepezil (ARICEPT) 10 MG tablet Take 10 mg by mouth at bedtime.  Marland Kitchen ezetimibe (ZETIA) 10 MG tablet Take 10 mg by mouth daily.   . fluticasone (FLONASE) 50 MCG/ACT nasal spray USE TWO SPRAYS INTO BOTH NOSTRILS DAILY  . fluticasone furoate-vilanterol (BREO ELLIPTA) 100-25 MCG/INH AEPB Inhale 1 puff into the lungs daily.  . furosemide (LASIX) 80 MG tablet Take 1 tablet (80 mg total) by mouth daily. (Patient taking differently: Take 80 mg by mouth 2 (two) times daily. )  . gabapentin (NEURONTIN) 100 MG capsule Take 1 capsule (100 mg total) by mouth 2 (two) times daily.  Marland Kitchen guaiFENesin (MUCINEX) 600 MG 12 hr tablet Take 2 tablets (1,200 mg total) by mouth 2 (two) times daily as needed for cough or to loosen phlegm.  . hydrALAZINE (APRESOLINE) 100 MG tablet Take 100 mg by mouth 2 (two) times daily.  . insulin aspart (NOVOLOG) 100 UNIT/ML injection Inject 0-9 Units into the skin 3 (three) times daily with meals. CBG < 70: implement hypoglycemia protocol CBG 70 - 120: 0 units CBG 121 - 150: 1 unit CBG 151 - 200: 2 units CBG 201 - 250: 3 units CBG 251 - 300: 5 units CBG 301 - 350: 7 units CBG 351 - 400: 9 units CBG > 400: call MD.  . insulin aspart (NOVOLOG) 100 UNIT/ML injection Inject 5 Units into the skin 3 (three) times daily with meals.  Marland Kitchen LEVEMIR FLEXTOUCH 100 UNIT/ML Pen Inject 18 Units into the skin daily.  Marland Kitchen losartan (COZAAR) 25 MG tablet Take 25 mg by mouth daily.  . meclizine (ANTIVERT) 25 MG tablet Take 1 tablet (25 mg total) by mouth 3 (three) times daily as needed for  dizziness.  . montelukast (SINGULAIR) 10 MG tablet Take 1 tablet (10 mg total) by mouth daily.  . nepafenac  (ILEVRO) 0.3 % ophthalmic suspension 1 drop daily.  . nitroGLYCERIN (NITROSTAT) 0.4 MG SL tablet PLACE ONE TABLET UNDER THE TONGUE EVERY FIVE MINUTES AS NEEDED FOR CHEST PAIN  . nystatin (NYSTATIN) powder Apply topically 4 (four) times daily. Apply to abdominal fold daily PRN  . omeprazole (PRILOSEC) 20 MG capsule Take 20 mg by mouth 2 (two) times daily before a meal.  . OXYGEN Inhale 2 L into the lungs continuous. CPAP with oxygen at bedtime   . raloxifene (EVISTA) 60 MG tablet Take 60 mg by mouth every morning.   . sucralfate (CARAFATE) 1 g tablet Take 1 g by mouth 2 (two) times daily.  . TRUEPLUS PEN NEEDLES 32G X 4 MM MISC INJECT THREE TIMES A DAY AS DIRECTED  . valACYclovir (VALTREX) 500 MG tablet Take 500 mg by mouth 3 (three) times daily.  . Vitamin D, Ergocalciferol, (DRISDOL) 50000 UNITS CAPS capsule Take 50,000 Units by mouth every Monday.   . [DISCONTINUED] amoxicillin (AMOXIL) 125 MG chewable tablet Chew 125 mg by mouth 2 (two) times daily.   No facility-administered encounter medications on file as of 11/04/2017.      Review of Systems  Review of Systems  Constitutional: Positive for fatigue. Negative for chills, fever and unexpected weight change.  HENT: Negative for congestion, ear pain, postnasal drip, sinus pressure and sinus pain.   Respiratory: Positive for cough (productive cough with green mucous ). Negative for chest tightness, shortness of breath and wheezing.   Cardiovascular: Negative for chest pain and palpitations.  Gastrointestinal: Negative for blood in stool, diarrhea, nausea and vomiting.       +heartburn, belly feels warm per patient  Genitourinary: Negative for dysuria, frequency and urgency.  Musculoskeletal: Negative for arthralgias.  Skin: Negative for color change.  Allergic/Immunologic: Negative for environmental allergies and food allergies.  Neurological: Positive for headaches (some morning ha, one ha last night ). Negative for dizziness and  light-headedness.  Psychiatric/Behavioral: Negative for dysphoric mood. The patient is not nervous/anxious.   All other systems reviewed and are negative.    Physical Exam  BP (!) 184/94 (BP Location: Left Arm, Cuff Size: Normal)   Pulse 76   Ht 5' 4.5" (1.638 m)   Wt 265 lb 12.8 oz (120.6 kg)   SpO2 92%   BMI 44.92 kg/m   >>> Discussed blood pressure results with patient as well as with [name] her caregiver.  Will direct them to primary care for further management and to notify them of what her recording was today.  Wt Readings from Last 5 Encounters:  11/04/17 265 lb 12.8 oz (120.6 kg)  10/22/17 268 lb (121.6 kg)  10/20/17 264 lb (119.7 kg)  10/15/17 264 lb 6.4 oz (119.9 kg)  10/06/17 266 lb 3.2 oz (120.7 kg)    Physical Exam  Constitutional: She is oriented to person, place, and time and well-developed, well-nourished, and in no distress. No distress.  HENT:  Head: Normocephalic and atraumatic.  Right Ear: Hearing, tympanic membrane, external ear and ear canal normal.  Left Ear: Hearing, tympanic membrane, external ear and ear canal normal.  Nose: Nose normal. Right sinus exhibits no maxillary sinus tenderness and no frontal sinus tenderness. Left sinus exhibits no maxillary sinus tenderness and no frontal sinus tenderness.  Mouth/Throat: Uvula is midline and oropharynx is clear and moist. No oropharyngeal exudate.  Eyes: Pupils are equal, round,  and reactive to light.  Neck: Normal range of motion. Neck supple. No JVD present.  Cardiovascular: Normal rate, regular rhythm and normal heart sounds.  Pulmonary/Chest: Effort normal and breath sounds normal. No accessory muscle usage. No respiratory distress. She has no decreased breath sounds. She has no wheezes. She has no rhonchi.  Abdominal: Soft. Bowel sounds are normal. There is tenderness in the epigastric area.  Musculoskeletal: Normal range of motion. She exhibits no edema.  Lymphadenopathy:    She has no cervical  adenopathy.  Neurological: She is alert and oriented to person, place, and time. Gait normal.  Skin: Skin is warm and dry. She is not diaphoretic. No erythema.  Psychiatric: Mood and memory normal. She exhibits disordered thought content. She has a flat affect.  Nursing note and vitals reviewed.     Lab Results:  CBC    Component Value Date/Time   WBC 7.7 09/11/2017 0218   RBC 3.96 09/11/2017 0218   HGB 10.0 (L) 10/24/2017 1015   HCT 29.7 (L) 09/11/2017 0218   PLT 192 09/11/2017 0218   MCV 75.0 (L) 09/11/2017 0218   MCH 22.2 (L) 09/11/2017 0218   MCHC 29.6 (L) 09/11/2017 0218   RDW 19.9 (H) 09/11/2017 0218   LYMPHSABS 2.0 09/05/2017 0339   MONOABS 1.5 (H) 09/05/2017 0339   EOSABS 0.1 09/05/2017 0339   BASOSABS 0.0 09/05/2017 0339    BMET    Component Value Date/Time   NA 145 10/24/2017 1009   K 3.8 10/24/2017 1009   CL 109 10/24/2017 1009   CO2 25 10/24/2017 1009   GLUCOSE 252 (H) 10/24/2017 1009   BUN 31 (H) 10/24/2017 1009   CREATININE 1.98 (H) 10/24/2017 1009   CREATININE 1.36 (H) 07/24/2011 0925   CALCIUM 8.7 (L) 10/24/2017 1009   CALCIUM 8.7 10/24/2017 1009   GFRNONAA 24 (L) 10/24/2017 1009   GFRAA 28 (L) 10/24/2017 1009    BNP    Component Value Date/Time   BNP 58.5 09/04/2017 2139    ProBNP    Component Value Date/Time   PROBNP 65.0 05/20/2014 1620      Assessment & Plan:   72 year old female patient presenting today for follow-up visit.  Unfortunately patient is still struggling with blood pressure control.  Patient presents today with her caregiver [name].  Unfortunately [name] and the patient are unable to coordinate schedules to ensure the patient is observed when she is taking her blood pressure medications.  Patient has completed close follow-up with primary care and was recently being seen on 10/31/2017.  Her blood pressure was controlled at that office visit.  This leads me to believe the patient is not taking her medications as prescribed.   Patient will need to follow-up with primary care as well as to continue to work with [facility] to ensure compliance of blood pressure medications.  Patient is also not using her CPAP as prescribed.  Patient reports this is because she does not have the ability to connect her oxygen with her CPAP.  I have placed an order to obtain the supplies.  Patient have close follow-up with our office to see Dr. Halford Chessman as he is the one who is managing her obstructive sleep apnea.  Obstructive sleep apnea  We recommend that you continue using your CPAP daily >>>Keep up the hard work using your device >>> Goal should be wearing this for the entire night that you are sleeping, at least 4 to 6 hours  Remember:  . Do not drive  or operate heavy machinery if tired or drowsy.  . Please notify the supply company and office if you are unable to use your device regularly due to missing supplies or machine being broken.  . Work on maintaining a healthy weight and following your recommended nutrition plan  . Maintain proper daily exercise and movement  . Maintaining proper use of your device can also help improve management of other chronic illnesses such as: Blood pressure, blood sugars, and weight management.   BiPAP/ CPAP Cleaning:  >>>Clean weekly, with Dawn soap, and bottle brush.  Set up to air dry.  Please schedule follow-up with Dr. Halford Chessman in 2 to 4 weeks for CPAP/obstructive sleep apnea follow-up   Essential hypertension Follow up with PCP regarding elevated blood pressure  >>> Discussed with PCP if they should increase home health nurse visits due to uncontrolled blood pressure  Please ensure that somebody is visually watching you take your medications every time you take your medications to ensure compliance  Continue to work with [facility] to be set up for packets of your medications to ensure compliance   Chronic obstructive asthma (HCC) Continue Breo Ellipta 100 >>>  Take 1 puff daily in the morning right when you wake up >>>Rinse your mouth out after use >>>This is a daily maintenance inhaler, NOT a rescue inhaler >>>Contact our office if you are having difficulties affording or obtaining this medication >>>It is important for you to be able to take this daily and not miss any doses   Please schedule follow-up with Dr. Melvyn Novas in 2 to 4 weeks  Please ensure that somebody is visually watching you take your medications every time you take your medications to ensure compliance  Continue to work with [facility] to be set up for packets of your medications to ensure compliance  High-dose flu vaccine today  Continue omeprazole as prescribed Review GERD literature below  Medication management Follow up with PCP regarding elevated blood pressure  >>> Discussed with PCP if they should increase home health nurse visits due to uncontrolled blood pressure  Please ensure that somebody is visually watching you take your medications every time you take your medications to ensure compliance  Continue to work with [facility] to be set up for packets of your medications to ensure compliance  Continue omeprazole as prescribed Review GERD literature below  Ineffective health maintenance This continues to be a problem from his center and as well as with her caregiver [name].  Patient continues to struggle with plan of care and adherence to it.  Patient reports adherence but it is difficult to confirm that with her monitoring.  Patient will need close follow-up with our office to ensure CPAP compliance.  Patient will need close follow-up with primary care to ensure her blood pressures under control  I have notified [facility] as well as [employer] of patient's blood pressure today  This appointment was 42 minutes along with her 50% of that time in direct face-to-face patient care, assessment, plan of care  follow-up and discussion.   Lauraine Rinne, NP 11/04/2017

## 2017-11-03 NOTE — Patient Outreach (Signed)
[facility]) Care Management  11/03/2017  YVONDA FOUTY 1945/10/29 [id number]   Reason for referral: Medication adherence, medication management  Care coordination call to [place].  Aricept prescription received from [facility].  Pharmacist spoke with caregiver, [name], who reports patient has aspirin at home they will use instead of having this put into packs.  Compliance packs delivery scheduled for this week.   Plan: I will follow-up with patient and caregiver later this week regarding compliance packs to ensure patient understands how to use.   Ralene Bathe, PharmD, [place] [phone]

## 2017-11-03 NOTE — Patient Outreach (Signed)
[facility]) Care Management  11/03/2017  BIJAL SIGLIN 1945/03/07 [id number]   Call received from member's friend, [name] inquiring rescheduling visit with PT.  State PT was to visit on Friday but they had to cancel due to MD appointments.  Advised to contact home health agency to [place].  This care manager inquired about member's status, he report she is doing better.  Checking blood pressure daily, 140/80 today.  State he has been making sure member is taking her medications but is concerned because he did not receive pill packaging last week.  Advised per [facility] pharmacist note for today that the pharmacy was contacted, packaging will be delivered this week.  He verbalizes understanding.    Denies any urgent concerns at this time, will follow up within the next 2 weeks with home visit.  [facility] CM Care Plan Problem One     Most Recent Value  Care Plan Problem One  Risk for readmission as evidenced by recent hospitalization requiring SNF stay.  Role Documenting the Problem One  Care Management [place] for Problem One  Active  [facility] Long Term Goal   Patient will have no acute care admission for the next 31 days.  [facility] Long Term Goal Start Date  10/07/17  [facility] Long Term Goal Met Date  11/03/17  [facility] CM Short Term Goal #2   Member will report taking medications as instructed over the next 4 weeks  [facility] CM Short Term Goal #2 Start Date  10/07/17  [facility] CM Short Term Goal #2 Met Date  11/03/17     [name], RN, MSN [place] [phone]

## 2017-11-04 ENCOUNTER — Encounter: Payer: Self-pay | Admitting: Pulmonary Disease

## 2017-11-04 ENCOUNTER — Ambulatory Visit (INDEPENDENT_AMBULATORY_CARE_PROVIDER_SITE_OTHER): Payer: Medicare Other | Admitting: Pulmonary Disease

## 2017-11-04 VITALS — BP 184/94 | HR 76 | Ht 64.5 in | Wt 265.8 lb

## 2017-11-04 DIAGNOSIS — Z79899 Other long term (current) drug therapy: Secondary | ICD-10-CM | POA: Diagnosis not present

## 2017-11-04 DIAGNOSIS — Z789 Other specified health status: Secondary | ICD-10-CM

## 2017-11-04 DIAGNOSIS — I10 Essential (primary) hypertension: Secondary | ICD-10-CM | POA: Diagnosis not present

## 2017-11-04 DIAGNOSIS — E1122 Type 2 diabetes mellitus with diabetic chronic kidney disease: Secondary | ICD-10-CM | POA: Diagnosis not present

## 2017-11-04 DIAGNOSIS — J449 Chronic obstructive pulmonary disease, unspecified: Secondary | ICD-10-CM | POA: Diagnosis not present

## 2017-11-04 DIAGNOSIS — N183 Chronic kidney disease, stage 3 (moderate): Secondary | ICD-10-CM | POA: Diagnosis not present

## 2017-11-04 DIAGNOSIS — Z23 Encounter for immunization: Secondary | ICD-10-CM

## 2017-11-04 DIAGNOSIS — G4733 Obstructive sleep apnea (adult) (pediatric): Secondary | ICD-10-CM

## 2017-11-04 DIAGNOSIS — I130 Hypertensive heart and chronic kidney disease with heart failure and stage 1 through stage 4 chronic kidney disease, or unspecified chronic kidney disease: Secondary | ICD-10-CM | POA: Diagnosis not present

## 2017-11-04 DIAGNOSIS — I5022 Chronic systolic (congestive) heart failure: Secondary | ICD-10-CM | POA: Diagnosis not present

## 2017-11-04 DIAGNOSIS — D631 Anemia in chronic kidney disease: Secondary | ICD-10-CM | POA: Diagnosis not present

## 2017-11-04 DIAGNOSIS — I2510 Atherosclerotic heart disease of native coronary artery without angina pectoris: Secondary | ICD-10-CM | POA: Diagnosis not present

## 2017-11-04 NOTE — Assessment & Plan Note (Signed)
Follow up with PCP regarding elevated blood pressure  >>> Discussed with PCP if they should increase home health nurse visits due to uncontrolled blood pressure  Please ensure that somebody is visually watching you take your medications every time you take your medications to ensure compliance  Continue to work with [facility] to be set up for packets of your medications to ensure compliance

## 2017-11-04 NOTE — Assessment & Plan Note (Signed)
Follow up with PCP regarding elevated blood pressure  >>> Discussed with PCP if they should increase home health nurse visits due to uncontrolled blood pressure  Please ensure that somebody is visually watching you take your medications every time you take your medications to ensure compliance  Continue to work with [facility] to be set up for packets of your medications to ensure compliance  Continue omeprazole as prescribed Review GERD literature below

## 2017-11-04 NOTE — Progress Notes (Signed)
Reviewed and agree with assessment/plan.   Aleeya Veitch, MD [facility] 01/17/2016, 12:24 PM Pager:  [phone]  

## 2017-11-04 NOTE — Progress Notes (Signed)
Thank you   Joann Gutierrez  

## 2017-11-04 NOTE — Patient Instructions (Addendum)
Follow up with PCP regarding elevated blood pressure  >>> Discussed with PCP if they should increase home health nurse visits due to uncontrolled blood pressure  We recommend that you continue using your CPAP daily >>>Keep up the hard work using your device >>> Goal should be wearing this for the entire night that you are sleeping, at least 4 to 6 hours  Remember:  . Do not drive or operate heavy machinery if tired or drowsy.  . Please notify the supply company and office if you are unable to use your device regularly due to missing supplies or machine being broken.  . Work on maintaining a healthy weight and following your recommended nutrition plan  . Maintain proper daily exercise and movement  . Maintaining proper use of your device can also help improve management of other chronic illnesses such as: Blood pressure, blood sugars, and weight management.   BiPAP/ CPAP Cleaning:  >>>Clean weekly, with Dawn soap, and bottle brush.  Set up to air dry.  Continue Breo Ellipta 100 >>> Take 1 puff daily in the morning right when you wake up >>>Rinse your mouth out after use >>>This is a daily maintenance inhaler, NOT a rescue inhaler >>>Contact our office if you are having difficulties affording or obtaining this medication >>>It is important for you to be able to take this daily and not miss any doses     Please schedule follow-up with Dr. Melvyn Novas in 2 to 4 weeks  Please schedule follow-up with Dr. Halford Chessman in 2 to 4 weeks for CPAP/obstructive sleep apnea follow-up  Please ensure that somebody is visually watching you take your medications every time you take your medications to ensure compliance  Continue to work with [facility] to be set up for packets of your medications to ensure compliance  High-dose flu vaccine today  Continue omeprazole as prescribed Review GERD literature below     November/2019 we will be moving! We will no longer be at our [place] location.  Be on  the look out for a post card/mailer to let you know we have officially moved.  Our new address and phone number will be:  [address] [place], [place] [address] Telephone number: [phone]  It is flu season:   >>>Remember to be washing your hands regularly, using hand sanitizer, be careful to use around herself with has contact with people who are sick will increase her chances of getting sick yourself. >>> Best ways to protect herself from the flu: Receive the yearly flu vaccine, practice good hand hygiene washing with soap and also using hand sanitizer when available, eat a nutritious meals, get adequate rest, hydrate appropriately   Please contact the office if your symptoms worsen or you have concerns that you are not improving.   Thank you for choosing [facility] for your healthcare, and for allowing [place] to partner with you on your healthcare journey. I am thankful to be able to provide care to you today.   Wyn Quaker FNP-C   Food Choices for Gastroesophageal Reflux Disease, Adult When you have gastroesophageal reflux disease (GERD), the foods you eat and your eating habits are very important. Choosing the right foods can help ease your discomfort. What guidelines do I need to follow?  Choose fruits, vegetables, whole grains, and low-fat dairy products.  Choose low-fat meat, fish, and poultry.  Limit fats such as oils, salad dressings, butter, nuts, and avocado.  Keep a food diary. This helps you identify  foods that cause symptoms.  Avoid foods that cause symptoms. These may be different for everyone.  Eat small meals often instead of 3 large meals a day.  Eat your meals slowly, in a place where you are relaxed.  Limit fried foods.  Cook foods using methods other than frying.  Avoid drinking alcohol.  Avoid drinking large amounts of liquids with your meals.  Avoid bending over or lying down until 2-3 hours after eating. What foods are not  recommended? These are some foods and drinks that may make your symptoms worse: Vegetables Tomatoes. Tomato juice. Tomato and spaghetti sauce. Chili peppers. Onion and garlic. Horseradish. Fruits Oranges, grapefruit, and lemon (fruit and juice). Meats High-fat meats, fish, and poultry. This includes hot dogs, ribs, ham, sausage, salami, and bacon. Dairy Whole milk and chocolate milk. Sour cream. Cream. Butter. Ice cream. Cream cheese. Drinks Coffee and tea. Bubbly (carbonated) drinks or energy drinks. Condiments Hot sauce. Barbecue sauce. Sweets/Desserts Chocolate and cocoa. Donuts. Peppermint and spearmint. Fats and Oils High-fat foods. This includes [place] fries and potato chips. Other Vinegar. Strong spices. This includes black pepper, white pepper, red pepper, cayenne, curry powder, cloves, ginger, and chili powder. The items listed above may not be a complete list of foods and drinks to avoid. Contact your dietitian for more information. This information is not intended to replace advice given to you by your health care provider. Make sure you discuss any questions you have with your health care provider. Document Released: 07/09/2011 Document Revised: 06/15/2015 Document Reviewed: 11/11/2012 Elsevier Interactive Patient Education  2017 [place].  Gastroesophageal Reflux Disease, Adult Normally, food travels down the esophagus and stays in the stomach to be digested. If a person has gastroesophageal reflux disease (GERD), food and stomach acid move back up into the esophagus. When this happens, the esophagus becomes sore and swollen (inflamed). Over time, GERD can make small holes (ulcers) in the lining of the esophagus. Follow these instructions at home: Diet  Follow a diet as told by your doctor. You may need to avoid foods and drinks such as: ? Coffee and tea (with or without caffeine). ? Drinks that contain alcohol. ? Energy drinks and sports drinks. ? Carbonated  drinks or sodas. ? Chocolate and cocoa. ? Peppermint and mint flavorings. ? Garlic and onions. ? Horseradish. ? Spicy and acidic foods, such as peppers, chili powder, curry powder, vinegar, hot sauces, and BBQ sauce. ? Citrus fruit juices and citrus fruits, such as oranges, lemons, and limes. ? Tomato-based foods, such as red sauce, chili, salsa, and pizza with red sauce. ? Fried and fatty foods, such as donuts, french fries, potato chips, and high-fat dressings. ? High-fat meats, such as hot dogs, rib eye steak, sausage, ham, and bacon. ? High-fat dairy items, such as whole milk, butter, and cream cheese.  Eat small meals often. Avoid eating large meals.  Avoid drinking large amounts of liquid with your meals.  Avoid eating meals during the 2-3 hours before bedtime.  Avoid lying down right after you eat.  Do not exercise right after you eat. General instructions  Pay attention to any changes in your symptoms.  Take over-the-counter and prescription medicines only as told by your doctor. Do not take aspirin, ibuprofen, or other NSAIDs unless your doctor says it is okay.  Do not use any tobacco products, including cigarettes, chewing tobacco, and e-cigarettes. If you need help quitting, ask your doctor.  Wear loose clothes. Do not wear anything tight around your waist.  Raise (elevate) the head of your bed about 6 inches (15 cm).  Try to lower your stress. If you need help doing this, ask your doctor.  If you are overweight, lose an amount of weight that is healthy for you. Ask your doctor about a safe weight loss goal.  Keep all follow-up visits as told by your doctor. This is important. Contact a doctor if:  You have new symptoms.  You lose weight and you do not know why it is happening.  You have trouble swallowing, or it hurts to swallow.  You have wheezing or a cough that keeps happening.  Your symptoms do not get better with treatment.  You have a hoarse  voice. Get help right away if:  You have pain in your arms, neck, jaw, teeth, or back.  You feel sweaty, dizzy, or light-headed.  You have chest pain or shortness of breath.  You throw up (vomit) and your throw up looks like blood or coffee grounds.  You pass out (faint).  Your poop (stool) is bloody or black.  You cannot swallow, drink, or eat. This information is not intended to replace advice given to you by your health care provider. Make sure you discuss any questions you have with your health care provider. Document Released: 06/26/2007 Document Revised: 06/15/2015 Document Reviewed: 05/04/2014 Elsevier Interactive Patient Education  2018 [employer].   Hypertension Hypertension is another name for high blood pressure. High blood pressure forces your heart to work harder to pump blood. This can cause problems over time. There are two numbers in a blood pressure reading. There is a top number (systolic) over a bottom number (diastolic). It is best to have a blood pressure below 120/80. Healthy choices can help lower your blood pressure. You may need medicine to help lower your blood pressure if:  Your blood pressure cannot be lowered with healthy choices.  Your blood pressure is higher than 130/80.  Follow these instructions at home: Eating and drinking  If directed, follow the DASH eating plan. This diet includes: ? Filling half of your plate at each meal with fruits and vegetables. ? Filling one quarter of your plate at each meal with whole grains. Whole grains include whole wheat pasta, brown rice, and whole grain bread. ? Eating or drinking low-fat dairy products, such as skim milk or low-fat yogurt. ? Filling one quarter of your plate at each meal with low-fat (lean) proteins. Low-fat proteins include fish, skinless chicken, eggs, beans, and tofu. ? Avoiding fatty meat, cured and processed meat, or chicken with skin. ? Avoiding premade or processed food.  Eat less  than 1,500 mg of salt (sodium) a day.  Limit alcohol use to no more than 1 drink a day for nonpregnant women and 2 drinks a day for men. One drink equals 12 oz of beer, 5 oz of wine, or 1 oz of hard liquor. Lifestyle  Work with your doctor to stay at a healthy weight or to lose weight. Ask your doctor what the best weight is for you.  Get at least 30 minutes of exercise that causes your heart to beat faster (aerobic exercise) most days of the week. This may include walking, swimming, or biking.  Get at least 30 minutes of exercise that strengthens your muscles (resistance exercise) at least 3 days a week. This may include lifting weights or pilates.  Do not use any products that contain nicotine or tobacco. This includes cigarettes and e-cigarettes. If you need help quitting,  ask your doctor.  Check your blood pressure at home as told by your doctor.  Keep all follow-up visits as told by your doctor. This is important. Medicines  Take over-the-counter and prescription medicines only as told by your doctor. Follow directions carefully.  Do not skip doses of blood pressure medicine. The medicine does not work as well if you skip doses. Skipping doses also puts you at risk for problems.  Ask your doctor about side effects or reactions to medicines that you should watch for. Contact a doctor if:  You think you are having a reaction to the medicine you are taking.  You have headaches that keep coming back (recurring).  You feel dizzy.  You have swelling in your ankles.  You have trouble with your vision. Get help right away if:  You get a very bad headache.  You start to feel confused.  You feel weak or numb.  You feel faint.  You get very bad pain in your: ? Chest. ? Belly (abdomen).  You throw up (vomit) more than once.  You have trouble breathing. Summary  Hypertension is another name for high blood pressure.  Making healthy choices can help lower blood pressure.  If your blood pressure cannot be controlled with healthy choices, you may need to take medicine. This information is not intended to replace advice given to you by your health care provider. Make sure you discuss any questions you have with your health care provider. Document Released: 06/26/2007 Document Revised: 12/06/2015 Document Reviewed: 12/06/2015 Elsevier Interactive Patient Education  2018 [place].   Influenza Virus Vaccine injection What is this medicine? INFLUENZA VIRUS VACCINE (in floo EN zuh VAHY ruhs vak SEEN) helps to reduce the risk of getting influenza also known as the flu. The vaccine only helps protect you against some strains of the flu. This medicine may be used for other purposes; ask your health care provider or pharmacist if you have questions. COMMON BRAND NAME(S): Afluria, Agriflu, Alfuria, FLUAD, Fluarix, Fluarix Quadrivalent, Flublok, Flublok Quadrivalent, FLUCELVAX, Flulaval, Fluvirin, Fluzone, Fluzone High-Dose, Fluzone Intradermal What should I tell my health care provider before I take this medicine? They need to know if you have any of these conditions: -bleeding disorder like hemophilia -fever or infection -Guillain-Barre syndrome or other neurological problems -immune system problems -infection with the human immunodeficiency virus (HIV) or AIDS -low blood platelet counts -multiple sclerosis -an unusual or allergic reaction to influenza virus vaccine, latex, other medicines, foods, dyes, or preservatives. Different brands of vaccines contain different allergens. Some may contain latex or eggs. Talk to your doctor about your allergies to make sure that you get the right vaccine. -pregnant or trying to get pregnant -breast-feeding How should I use this medicine? This vaccine is for injection into a muscle or under the skin. It is given by a health care professional. A copy of Vaccine Information Statements will be given before each vaccination. Read  this sheet carefully each time. The sheet may change frequently. Talk to your healthcare provider to see which vaccines are right for you. Some vaccines should not be used in all age groups. Overdosage: If you think you have taken too much of this medicine contact a poison control center or emergency room at once. NOTE: This medicine is only for you. Do not share this medicine with others. What if I miss a dose? This does not apply. What may interact with this medicine? -chemotherapy or radiation therapy -medicines that lower your immune system like etanercept,  anakinra, infliximab, and adalimumab -medicines that treat or prevent blood clots like warfarin -phenytoin -steroid medicines like prednisone or cortisone -theophylline -vaccines This list may not describe all possible interactions. Give your health care provider a list of all the medicines, herbs, non-prescription drugs, or dietary supplements you use. Also tell them if you smoke, drink alcohol, or use illegal drugs. Some items may interact with your medicine. What should I watch for while using this medicine? Report any side effects that do not go away within 3 days to your doctor or health care professional. Call your health care provider if any unusual symptoms occur within 6 weeks of receiving this vaccine. You may still catch the flu, but the illness is not usually as bad. You cannot get the flu from the vaccine. The vaccine will not protect against colds or other illnesses that may cause fever. The vaccine is needed every year. What side effects may I notice from receiving this medicine? Side effects that you should report to your doctor or health care professional as soon as possible: -allergic reactions like skin rash, itching or hives, swelling of the face, lips, or tongue Side effects that usually do not require medical attention (report to your doctor or health care professional if they continue or are  bothersome): -fever -headache -muscle aches and pains -pain, tenderness, redness, or swelling at the injection site -tiredness This list may not describe all possible side effects. Call your doctor for medical advice about side effects. You may report side effects to FDA at 1-800-FDA-1088. Where should I keep my medicine? The vaccine will be given by a health care professional in a clinic, pharmacy, doctor's office, or other health care setting. You will not be given vaccine doses to store at home. NOTE: This sheet is a summary. It may not cover all possible information. If you have questions about this medicine, talk to your doctor, pharmacist, or health care provider.  2018 Elsevier/Gold Standard (2014-07-29 10:07:28)

## 2017-11-04 NOTE — Assessment & Plan Note (Signed)
This continues to be a problem from his center and as well as with her caregiver [name].  Patient continues to struggle with plan of care and adherence to it.  Patient reports adherence but it is difficult to confirm that with her monitoring.  Patient will need close follow-up with our office to ensure CPAP compliance.  Patient will need close follow-up with primary care to ensure her blood pressures under control  I have notified [facility] as well as [employer] of patient's blood pressure today

## 2017-11-04 NOTE — Assessment & Plan Note (Signed)
Continue Breo Ellipta 100 >>> Take 1 puff daily in the morning right when you wake up >>>Rinse your mouth out after use >>>This is a daily maintenance inhaler, NOT a rescue inhaler >>>Contact our office if you are having difficulties affording or obtaining this medication >>>It is important for you to be able to take this daily and not miss any doses   Please schedule follow-up with Dr. Melvyn Novas in 2 to 4 weeks  Please ensure that somebody is visually watching you take your medications every time you take your medications to ensure compliance  Continue to work with [facility] to be set up for packets of your medications to ensure compliance  High-dose flu vaccine today  Continue omeprazole as prescribed Review GERD literature below

## 2017-11-04 NOTE — Assessment & Plan Note (Signed)
  We recommend that you continue using your CPAP daily >>>Keep up the hard work using your device >>> Goal should be wearing this for the entire night that you are sleeping, at least 4 to 6 hours  Remember:  . Do not drive or operate heavy machinery if tired or drowsy.  . Please notify the supply company and office if you are unable to use your device regularly due to missing supplies or machine being broken.  . Work on maintaining a healthy weight and following your recommended nutrition plan  . Maintain proper daily exercise and movement  . Maintaining proper use of your device can also help improve management of other chronic illnesses such as: Blood pressure, blood sugars, and weight management.   BiPAP/ CPAP Cleaning:  >>>Clean weekly, with Dawn soap, and bottle brush.  Set up to air dry.  Please schedule follow-up with Dr. Halford Chessman in 2 to 4 weeks for CPAP/obstructive sleep apnea follow-up

## 2017-11-04 NOTE — Progress Notes (Signed)
Thank you. Are you also following up with [name] her PCP?   Aaron Edelman

## 2017-11-06 ENCOUNTER — Encounter (HOSPITAL_COMMUNITY): Payer: Medicare Other

## 2017-11-06 ENCOUNTER — Encounter: Payer: Self-pay | Admitting: Podiatry

## 2017-11-06 ENCOUNTER — Ambulatory Visit (INDEPENDENT_AMBULATORY_CARE_PROVIDER_SITE_OTHER): Payer: Medicare Other | Admitting: Podiatry

## 2017-11-06 DIAGNOSIS — I5022 Chronic systolic (congestive) heart failure: Secondary | ICD-10-CM | POA: Diagnosis not present

## 2017-11-06 DIAGNOSIS — M79671 Pain in right foot: Secondary | ICD-10-CM | POA: Diagnosis not present

## 2017-11-06 DIAGNOSIS — N183 Chronic kidney disease, stage 3 (moderate): Secondary | ICD-10-CM | POA: Diagnosis not present

## 2017-11-06 DIAGNOSIS — B351 Tinea unguium: Secondary | ICD-10-CM

## 2017-11-06 DIAGNOSIS — I2510 Atherosclerotic heart disease of native coronary artery without angina pectoris: Secondary | ICD-10-CM | POA: Diagnosis not present

## 2017-11-06 DIAGNOSIS — E1122 Type 2 diabetes mellitus with diabetic chronic kidney disease: Secondary | ICD-10-CM | POA: Diagnosis not present

## 2017-11-06 DIAGNOSIS — M79672 Pain in left foot: Secondary | ICD-10-CM

## 2017-11-06 DIAGNOSIS — I130 Hypertensive heart and chronic kidney disease with heart failure and stage 1 through stage 4 chronic kidney disease, or unspecified chronic kidney disease: Secondary | ICD-10-CM | POA: Diagnosis not present

## 2017-11-06 DIAGNOSIS — D631 Anemia in chronic kidney disease: Secondary | ICD-10-CM | POA: Diagnosis not present

## 2017-11-06 NOTE — Patient Instructions (Signed)
Seen for hypertrophic nails. All nails debrided. Return as needed.  

## 2017-11-06 NOTE — Progress Notes (Signed)
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   Objective: Dermatologic:  Thick mycotic nails x 10. Loose nail plate left great toe. Vascular: All pedal pulses are palpable. Orthopedic: Mild hallux valgus with bunion and hypermobile first ray R>L. Tight Achilles tendon bilateral.  Neurologic: All epicritic and tactile sensations grossly intact.  Assessment: Onychomycosis x 10. Traumatized left great toe nail. Painful feet.  Plan: All nails debrided.  Patient was advised to seek another podiatrist.

## 2017-11-07 ENCOUNTER — Other Ambulatory Visit: Payer: Self-pay | Admitting: Pharmacist

## 2017-11-07 ENCOUNTER — Encounter (HOSPITAL_COMMUNITY): Payer: Medicare Other

## 2017-11-07 NOTE — Patient Outreach (Signed)
[place] [facility]) Care Management  [place] 11/07/2017  Joann Gutierrez 1945-11-30 [id number]  Reason for referral: medication management, transition to compliance packs  Successful call to Joann Gutierrez's caregiver, Joann Gutierrez.  Joann Gutierrez reports that compliance packs were supposed to have been delivered yesterday or today but they have not arrived yet.    3-way call placed to [facility].  Medications reviewed with pharmacist and Joann Gutierrez.  Delivery scheduled for October 21st, Monday afternoon between 3-6 pm.  Patient prefers to pay via check.  All PRN medications will need to be called in when needed.  Joann Gutierrez voiced understanding.  He requests compliance packs, insulin, and inhalers all be delivered together on Monday.  He is agreeable to a home visit next Friday to review medication adherence with compliance packs.   Plan; Home visit next Friday, October 25th, at 10:30 AM.   Joann Gutierrez, PharmD, [place] [phone]

## 2017-11-10 ENCOUNTER — Telehealth: Payer: Self-pay | Admitting: Pulmonary Disease

## 2017-11-10 DIAGNOSIS — I2510 Atherosclerotic heart disease of native coronary artery without angina pectoris: Secondary | ICD-10-CM | POA: Diagnosis not present

## 2017-11-10 DIAGNOSIS — I5022 Chronic systolic (congestive) heart failure: Secondary | ICD-10-CM | POA: Diagnosis not present

## 2017-11-10 DIAGNOSIS — D631 Anemia in chronic kidney disease: Secondary | ICD-10-CM | POA: Diagnosis not present

## 2017-11-10 DIAGNOSIS — N183 Chronic kidney disease, stage 3 (moderate): Secondary | ICD-10-CM | POA: Diagnosis not present

## 2017-11-10 DIAGNOSIS — I130 Hypertensive heart and chronic kidney disease with heart failure and stage 1 through stage 4 chronic kidney disease, or unspecified chronic kidney disease: Secondary | ICD-10-CM | POA: Diagnosis not present

## 2017-11-10 DIAGNOSIS — E1122 Type 2 diabetes mellitus with diabetic chronic kidney disease: Secondary | ICD-10-CM | POA: Diagnosis not present

## 2017-11-10 NOTE — Telephone Encounter (Signed)
Received a fax from pt's pharmacy stating the albuterol hfa was rejected.  Called pt's pharmacy and spoke with [facility] Stated to her about the fax that we had received.  Per Solmon Ice, pt had the ventolin hfa filled today in place of the albuterol hfa.  Nothing further needed.

## 2017-11-12 DIAGNOSIS — I2510 Atherosclerotic heart disease of native coronary artery without angina pectoris: Secondary | ICD-10-CM | POA: Diagnosis not present

## 2017-11-12 DIAGNOSIS — E1122 Type 2 diabetes mellitus with diabetic chronic kidney disease: Secondary | ICD-10-CM | POA: Diagnosis not present

## 2017-11-12 DIAGNOSIS — D631 Anemia in chronic kidney disease: Secondary | ICD-10-CM | POA: Diagnosis not present

## 2017-11-12 DIAGNOSIS — I130 Hypertensive heart and chronic kidney disease with heart failure and stage 1 through stage 4 chronic kidney disease, or unspecified chronic kidney disease: Secondary | ICD-10-CM | POA: Diagnosis not present

## 2017-11-12 DIAGNOSIS — N183 Chronic kidney disease, stage 3 (moderate): Secondary | ICD-10-CM | POA: Diagnosis not present

## 2017-11-12 DIAGNOSIS — I5022 Chronic systolic (congestive) heart failure: Secondary | ICD-10-CM | POA: Diagnosis not present

## 2017-11-14 ENCOUNTER — Other Ambulatory Visit: Payer: Self-pay | Admitting: Physician Assistant

## 2017-11-14 ENCOUNTER — Encounter: Payer: Self-pay | Admitting: Physician Assistant

## 2017-11-14 ENCOUNTER — Other Ambulatory Visit: Payer: Self-pay | Admitting: Pharmacist

## 2017-11-14 ENCOUNTER — Telehealth: Payer: Self-pay

## 2017-11-14 ENCOUNTER — Ambulatory Visit: Payer: Self-pay | Admitting: Pharmacist

## 2017-11-14 ENCOUNTER — Ambulatory Visit (INDEPENDENT_AMBULATORY_CARE_PROVIDER_SITE_OTHER): Payer: Medicare Other | Admitting: Physician Assistant

## 2017-11-14 VITALS — BP 173/82 | HR 70 | Ht 64.5 in | Wt 269.8 lb

## 2017-11-14 DIAGNOSIS — I10 Essential (primary) hypertension: Secondary | ICD-10-CM | POA: Diagnosis not present

## 2017-11-14 DIAGNOSIS — R079 Chest pain, unspecified: Secondary | ICD-10-CM | POA: Diagnosis not present

## 2017-11-14 DIAGNOSIS — I5033 Acute on chronic diastolic (congestive) heart failure: Secondary | ICD-10-CM | POA: Diagnosis not present

## 2017-11-14 MED ORDER — BISOPROLOL FUMARATE 5 MG PO TABS: 5.0000 mg | ORAL_TABLET | Freq: Two times a day (BID) | ORAL | 2 refills | Status: DC

## 2017-11-14 NOTE — Telephone Encounter (Signed)
Called [facility] to advise that patient was seen today and needed sooner appointment, Joann Gutierrez attempted to contact Dr.Patel with out able to reach him. I called and spoke to his nurse and she advised that she would pass a message to Dr.Patel and try to get her in sooner.

## 2017-11-14 NOTE — Patient Outreach (Signed)
[place] [facility]) Care Management  [place] 11/14/2017  Joann Gutierrez 05-Aug-1945 [id number]  Reason for call:  Confirm home visit today for follow-up on compliance packs  Successful outreach call to [facility], patient's caregiver.  Joann Gutierrez reports that today is no longer a good day for a home visit with Joann Gutierrez.  She is still sleeping and then has to use [facility] for PCP office visit at 1:30PM.  Joann Gutierrez requests that I visit with patient on Monday.  [facility] RN has home visit scheduled on Monday, October 28th, at 11:00AM.  I will plan on joint visit with RN.   Joann Gutierrez reports that compliance packs were delivered earlier this week.  He reports that using the new packs is going well.  He states that patient has not missed any doses of medications and he is able to use the packs to remind patient to take her medications.  He reports he has been checking blood pressure daily and recording it in a log.  I encouraged him to bring this log into the office visit today along with all medications and new compliance packs.  Joann Gutierrez voiced understanding.    Plan: Home visit with patient next Monday, October 28th, at [place], PharmD, [place] [phone]

## 2017-11-14 NOTE — Progress Notes (Signed)
Cardiology Office Note   Date:  11/14/2017   ID:  Joann Gutierrez, DOB 05-02-1945, MRN [id number]  PCP:  Seward Carol, MD Cardiologist:  Minus Breeding, MD 03/08/2013 Dr. Stanford Breed, in hospital, 09/08/2017 Rosaria Ferries, PA-C   Chief Complaint  Patient presents with  . Chest Pain    History of Present Illness: Joann Gutierrez is a 72 y.o. female with a history of  CVA, HTN, HLD, DM, GERD, seizures, COPD, and CKD, ?HOCM, CP w/ minimal CAD at cath 2015, nl EF by echo 2017, OSA on CPAP  Admitted 8/15-8/23/2019 with chest pain (reproducible on palpation), enzymes negative, after admission patient had an episode of hypotension and altered mental status, acute on chronic kidney disease and hyperkalemia, nephrology eval recommended, statin held for abdominal pain, continue Zetia.  Because of the hypertension, losartan metolazone doxazosin hydralazine and Imdur were on hold.  Patient with widespread soft tissue pain, normal CK, TSH, ESR with minimal elevation in ANA negative 10/15 pulmonary visit, patient blood pressure very elevated, med compliance felt poor   Joann Gutierrez presents for cardiology evaluation and follow up.  She is here with her friend who helps in her care.  She gets chest pains at times. Believes the last episode was last week. It started while she was watching TV. She took SL NTG x 2 and the pain resolved. 7/10, associated w/ SOB, GI upset, no diaphoresis. Sometimes she gets the pain when she is walking. The pain is worse with deep inspiration.   She is not very active, takes PT 2 x week for 1/2 hr.   She was barely able to walk from the lobby to the exam room. Had to use her inhaler.   She was previously not compliant w/ meds. She just started getting them from the pharmacy pre-packaged and that is helping compliance.  Her friend helps make sure she takes her medications.  She saw Dr. Posey Pronto last month, does not have another appointment with him for  several months.  She has lower extremity edema, she has been waking with this.  It is worse now than it was.    Past Medical History:  Diagnosis Date  . Adenomatous colon polyp   . Allergy   . Anemia   . Asthma       . CAD (coronary artery disease)    Mild very minimal coronary disease with 20% obtuse marginal stenosis  . Carpal tunnel syndrome on left   . CHF (congestive heart failure) ([place])   . Chronic kidney disease (CKD), stage III (moderate) (H[facility])   . COPD (chronic obstructive pulmonary disease) ([place])   . CVA (cerebral infarction)   . Depression   . 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 Diverticulosis   . 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 Esophageal dysmotility   . Esophageal stricture   . Gastritis   . Gastroparesis 08/21/2007  . GERD (gastroesophageal reflux disease)   . Hair loss   . Hearing loss of both ears   . Hernia, hiatal   . Hyperlipidemia   . Hypertension   . Morbid obesity ([place])   . Obesity   . Osteoarthritis   . OSTEOARTHRITIS 08/09/2006  . Osteoporosis   . 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 RENAL INSUFFICIENCY 02/16/2009  . 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 Seizures ([place])    pt thinks it has been several monthes since she had a seisure  . Shingles   .  Sickle cell trait ([place])   . Stroke ([place])   . Tubular adenoma of colon     Past Surgical History:  Procedure Laterality Date  . ABDOMINAL HYSTERECTOMY    . ARTERY BIOPSY  01/07/2011   Procedure: MINOR BIOPSY TEMPORAL ARTERY;  Surgeon: Haywood Lasso, MD;  Location: [place];  Service: General;  Laterality: Left;  left temporal artery biopsy  . bil foot surgery    . BREAST LUMPECTOMY     benign  . BREAST LUMPECTOMY     both breast lumps removed   . CATARACT EXTRACTION Left 06/2016   Dr. Read Drivers  . COLONOSCOPY    . COLONOSCOPY WITH PROPOFOL N/A 07/05/2015   Procedure: COLONOSCOPY WITH PROPOFOL;  Surgeon: Manus Gunning, MD;   Location: [facility];  Service: Gastroenterology;  Laterality: N/A;  . ERD  08/08/2000  . ESOPHAGEAL MANOMETRY N/A 03/13/2015   Procedure: ESOPHAGEAL MANOMETRY (EM);  Surgeon: Manus Gunning, MD;  Location: [facility];  Service: Gastroenterology;  Laterality: N/A;  . ESOPHAGOGASTRODUODENOSCOPY  06/25/2006  . ESOPHAGOGASTRODUODENOSCOPY (EGD) WITH PROPOFOL N/A 07/05/2015   Procedure: ESOPHAGOGASTRODUODENOSCOPY (EGD) WITH PROPOFOL;  Surgeon: Manus Gunning, MD;  Location: [facility];  Service: Gastroenterology;  Laterality: N/A;  . HERNIA REPAIR    . LEFT AND RIGHT HEART CATHETERIZATION WITH CORONARY ANGIOGRAM N/A 03/03/2013   Procedure: LEFT AND RIGHT HEART CATHETERIZATION WITH CORONARY ANGIOGRAM;  Surgeon: Minus Breeding, MD;  Location: [facility] C[facility] LAB;  Service: Cardiovascular;  Laterality: N/A;  . REPLACEMENT TOTAL KNEE Left 1998  . UPPER GASTROINTESTINAL ENDOSCOPY      Current Outpatient Medications  Medication Sig Dispense Refill  . albuterol (PROVENTIL HFA;VENTOLIN HFA) 108 (90 Base) MCG/ACT inhaler Inhale 2 puffs into the lungs every 6 (six) hours as needed for wheezing or shortness of breath. 1 Inhaler 6  . aspirin EC 81 MG tablet Take 81 mg by mouth daily.    Marland Kitchen azelastine (ASTELIN) 0.1 % nasal spray 1 spray 2 (two) times daily. Use in each nostril as directed    . bisoprolol (ZEBETA) 5 MG tablet Take 1 tablet (5 mg total) by mouth daily. 30 tablet 11  . calcium carbonate (OSCAL) 1500 (600 Ca) MG TABS tablet Take 600 mg of elemental calcium by mouth daily with breakfast.    . donepezil (ARICEPT) 10 MG tablet Take 10 mg by mouth at bedtime.    Marland Kitchen ezetimibe (ZETIA) 10 MG tablet Take 10 mg by mouth daily.     . fluticasone (FLONASE) 50 MCG/ACT nasal spray USE TWO SPRAYS INTO BOTH NOSTRILS DAILY 16 g 3  . fluticasone furoate-vilanterol (BREO ELLIPTA) 100-25 MCG/INH AEPB Inhale 1 puff into the lungs daily. 30 each 5  . furosemide (LASIX) 80 MG tablet Take 80 mg by mouth 2 (two)  times daily.    Marland Kitchen gabapentin (NEURONTIN) 100 MG capsule Take 1 capsule (100 mg total) by mouth 2 (two) times daily.    Marland Kitchen guaiFENesin (MUCINEX) 600 MG 12 hr tablet Take 2 tablets (1,200 mg total) by mouth 2 (two) times daily as needed for cough or to loosen phlegm.    . insulin aspart (NOVOLOG) 100 UNIT/ML injection Inject 0-9 Units into the skin 3 (three) times daily with meals. CBG < 70: implement hypoglycemia protocol CBG 70 - 120: 0 units CBG 121 - 150: 1 unit CBG 151 - 200: 2 units CBG 201 - 250: 3 units CBG 251 - 300: 5 units CBG 301 - 350: 7 units CBG 351 -  400: 9 units CBG > 400: call MD.    . insulin aspart (NOVOLOG) 100 UNIT/ML injection Inject 5 Units into the skin 3 (three) times daily with meals.    Marland Kitchen LEVEMIR FLEXTOUCH 100 UNIT/ML Pen Inject 18 Units into the skin daily.    . meclizine (ANTIVERT) 25 MG tablet Take 1 tablet (25 mg total) by mouth 3 (three) times daily as needed for dizziness. 30 tablet 0  . montelukast (SINGULAIR) 10 MG tablet Take 1 tablet (10 mg total) by mouth daily. 30 tablet 3  . nepafenac (ILEVRO) 0.3 % ophthalmic suspension 1 drop daily.    . nitroGLYCERIN (NITROSTAT) 0.4 MG SL tablet PLACE ONE TABLET UNDER THE TONGUE EVERY FIVE MINUTES AS NEEDED FOR CHEST PAIN 25 tablet 6  . nystatin (NYSTATIN) powder Apply topically 4 (four) times daily. Apply to abdominal fold daily PRN    . omeprazole (PRILOSEC) 20 MG capsule Take 20 mg by mouth 2 (two) times daily before a meal.    . OXYGEN Inhale 2 L into the lungs continuous. CPAP with oxygen at bedtime     . raloxifene (EVISTA) 60 MG tablet Take 60 mg by mouth every morning.     . TRUEPLUS PEN NEEDLES 32G X 4 MM MISC INJECT THREE TIMES A DAY AS DIRECTED  1  . Vitamin D, Ergocalciferol, (DRISDOL) 50000 UNITS CAPS capsule Take 50,000 Units by mouth every Monday.      No current facility-administered medications for this visit.     Allergies:   Morphine and related and Promethazine hcl    Social History:  The  patient  reports that she quit smoking about 37 years ago. She has a 5.00 pack-year smoking history. She has never used smokeless tobacco. She reports that she does not drink alcohol or use drugs.   Family History:  The patient's family history includes Allergies in her sister; Cancer in her brother; Colon cancer in her brother; Diabetes in her mother, sister, and sister; Heart attack in her father; Heart disease in her mother, sister, and sister; Heart disease (age of onset: 37) in her father; Kidney disease in her mother, sister, and sister; Liver cancer in her mother; Sickle cell anemia in her father.  She indicated that her mother is deceased. She indicated that her father is deceased. She indicated that the status of her brother is unknown. She indicated that the status of her neg hx is unknown.   ROS:  Please see the history of present illness. All other systems are reviewed and negative.    PHYSICAL EXAM: VS:  BP (!) 173/82   Pulse 70   Ht 5' 4.5" (1.638 m)   Wt 269 lb 12.8 oz (122.4 kg)   BMI 45.60 kg/m  , BMI Body mass index is 45.6 kg/m. GEN: Well nourished, well developed, female in no acute distress  HEENT: normal for age  Neck: Minimal JVD, no carotid bruit, no masses Cardiac: RRR; soft murmur, no rubs, or gallops Respiratory: Decreased breath sounds bases with a few scattered rales bilaterally, normal work of breathing GI: soft, nontender, nondistended, + BS MS: no deformity or atrophy; 2+ lower extremity edema; distal pulses are 2+ in all 4 extremities  Skin: warm and dry, no rash Neuro:  Strength and sensation are intact Psych: euthymic mood, full affect   EKG:  EKG is ordered today. The ekg ordered today demonstrates sinus rhythm, no acute ischemic changes and normal intervals, heart rate 70  ECHO: 09/08/2017 - Left ventricle: The  cavity size was normal. There was moderate   concentric hypertrophy. Systolic function was vigorous. The   estimated ejection fraction  was in the range of 65% to 70%. There   was dynamic obstruction at restin the mid cavity, with a peak   velocity of 110 cm/sec and a peak gradient of 5 mm Hg. Wall   motion was normal; there were no regional wall motion   abnormalities. There was an increased relative contribution of   atrial contraction to ventricular filling. Doppler parameters are   consistent with abnormal left ventricular relaxation (grade 1   diastolic dysfunction). Doppler parameters are consistent with   high ventricular filling pressure. - Pulmonary arteries: PA peak pressure: 39 mm Hg (S).  Impressions:  - moderate LVH, hyperdynamic LVF with mid cavity obliteration and   dynamic obstruction with resting gradient of 54mHg in the mid LV   cavity to LVOT and grade 1 DD with increased filling pressures,   mild TR and mild pulmonary HTN. The right ventricular systolic   pressure was increased consistent with mild pulmonary   hypertension.  CATH: 03/03/2013 Procedural Findings: Hemodynamics:                                     RA 10                                     RV 38/7                                     PA 41/16  28                                     PCWP 13                                     LV 142/5                                     AO 137/76              Oxygen saturations:                                     PA 93                                     AO 67              Cardiac Output (Fick) 9                               Cardiac           Index (Fick) 4   Coronary angiography:  Coronary dominance: right  Left mainstem: Normal  Left anterior descending (LAD): Tortuous.  Mild luminal irregularities.  The  vessel is large and wraps the apex.    Left circumflex (LCx): Tortuous and normal.   PL large and normal.  RI moderate sized with ostial 25% stenosis.    Right coronary artery (RCA): Large and normal.  PDA normal.   Left ventriculography: Left ventricular systolic function  is normal, LVEF is estimated at 70%, there is no significant mitral regurgitation   Final Conclusions:  Mildly elevated pulmonary pressures.  NL LV function.  Minimal CAD.  Recommendations: The etiology of her chest pain does not appear to be cardiac.  Dyspnea is likely multifactorial but there is minimal evidence for diastolic dysfunction as a primary contributor.  (Pulm pressure is upper limits of normal.)  No further work up is planned.     Recent Labs: 09/04/2017: B Natriuretic Peptide 58.5 09/05/2017: ALT 8; TSH 2.748 09/11/2017: Platelets 192 10/24/2017: BUN 31; Creatinine, Ser 1.98; Hemoglobin 10.0; Potassium 3.8; Sodium 145  CBC    Component Value Date/Time   WBC 7.7 09/11/2017 0218   RBC 3.96 09/11/2017 0218   HGB 10.0 (L) 10/24/2017 1015   HCT 29.7 (L) 09/11/2017 0218   PLT 192 09/11/2017 0218   MCV 75.0 (L) 09/11/2017 0218   MCH 22.2 (L) 09/11/2017 0218   MCHC 29.6 (L) 09/11/2017 0218   RDW 19.9 (H) 09/11/2017 0218   LYMPHSABS 2.0 09/05/2017 0339   MONOABS 1.5 (H) 09/05/2017 0339   EOSABS 0.1 09/05/2017 0339   BASOSABS 0.0 09/05/2017 0339   CMP Latest Ref Rng & Units 10/24/2017 10/24/2017 09/25/2017  Glucose 70 - 99 mg/dL 252(H) - 134(H)  BUN 8 - 23 mg/dL 31(H) - 35(H)  Creatinine 0.44 - 1.00 mg/dL 1.98(H) - 1.97(H)  Sodium 135 - 145 mmol/L 145 - 145  Potassium 3.5 - 5.1 mmol/L 3.8 - 3.9  Chloride 98 - 111 mmol/L 109 - 107  CO2 22 - 32 mmol/L 25 - 28  Calcium 8.7 - 10.3 mg/dL 8.7(L) 8.7 8.3(L)  Total Protein 6.5 - 8.1 g/dL - - -  Total Bilirubin 0.3 - 1.2 mg/dL - - -  Alkaline Phos 38 - 126 U/L - - -  AST 15 - 41 U/L - - -  ALT 0 - 44 U/L - - -     Lipid Panel    Component Value Date/Time   CHOL 238 (H) 09/06/2017 0308   TRIG 174 (H) 09/06/2017 0308   HDL 37 (L) 09/06/2017 0308   CHOLHDL 6.4 09/06/2017 0308   VLDL 35 09/06/2017 0308   LDLCALC 166 (H) 09/06/2017 0308   LDLDIRECT 190.0 01/02/2009 1101     Wt Readings from Last 3 Encounters:  11/14/17 269  lb 12.8 oz (122.4 kg)  11/04/17 265 lb 12.8 oz (120.6 kg)  10/22/17 268 lb (121.6 kg)     Other studies Reviewed: Additional studies/ records that were reviewed today include: Office notes, hospital records and testing.  ASSESSMENT AND PLAN:  1.  Acute on chronic diastolic CHF: - She has some volume overload by exam, but has chronic kidney disease and has had worsening of her renal function with diuresis in the past. - Med compliance has improved.  Her friend does the cooking and says he is doing low-sodium diet.  She may be drinking excess liquids during the day, this is difficult to ascertain. - When Dr. Posey Pronto saw her on 9/27, she was felt to have mild volume overload and her weight at that time was 267 pounds. - Today, she is 269 pounds, not much different.  However, she has 2+ lower extremity edema and says she is waking with this, not just getting it during the day. - We will try to get in touch with Dr. Posey Pronto.  If we are unable to get in touch with Dr. Posey Pronto, will try to get her an appointment with the nephrologist in the near future as she is not scheduled to see them until January.  2.  Chest pain, moderate risk of cardiac etiology: -Some of her symptoms are atypical and likely not cardiac. -However, she has some exertional chest pain that is more concerning. -She has not had a stress test in 5 years. - Lexiscan Myoview, 2-day study  3.  Hypertension: -Her blood pressure is elevated in the office today. -  I will increase her bisoprolol to 5 mg twice daily.   Current medicines are reviewed at length with the patient today.  The patient does not have concerns regarding medicines.  The following changes have been made: Increase bisoprolol  Labs/ tests ordered today include:  No orders of the defined types were placed in this encounter.   Disposition:   FU with Minus Breeding, MD  Signed, Rosaria Ferries, PA-C  11/14/2017 1:35 PM    [facility] Phone: [phone]; Fax: [phone]  This note was written with the assistance of speech recognition software.  Please excuse any transcriptional errors.

## 2017-11-14 NOTE — Patient Instructions (Addendum)
Medication Instructions:  Increase Bisoprolol 5mg  to twice daily If you need a refill on your cardiac medications before your next appointment, please call your pharmacy.   Lab work: None Ordered. If you have labs (blood work) drawn today and your tests are completely normal, you will receive your results only by: Marland Kitchen MyChart Message (if you have MyChart) OR . A paper copy in the mail If you have any lab test that is abnormal or we need to change your treatment, we will call you to review the results.  Testing/Procedures: Your physician has requested that you have a lexiscan myoview. A cardiac stress test is a cardiological test that measures the heart's ability to respond to external stress in a controlled clinical environment. The stress response is induced by intravenous pharmacological stimulation.  Hold AM meal insulin, take half of levemir insulin.  Follow-Up: At [facility], you and your health needs are our priority.  As part of our continuing mission to provide you with exceptional heart care, we have created designated Provider Care Teams.  These Care Teams include your primary Cardiologist (physician) and Advanced Practice Providers (APPs -  Physician Assistants and Nurse Practitioners) who all work together to provide you with the care you need, when you need it. You will need a follow up appointment in 3 months.  Please call our office 2 months in advance to schedule this appointment.  You may see Minus Breeding, MD or one of the following Advanced Practice Providers on your designated Care Team:   Rosaria Ferries, PA-C . Jory Sims, DNP, ANP  Any Other Special Instructions Will Be Listed Below (If Applicable). None

## 2017-11-17 ENCOUNTER — Other Ambulatory Visit: Payer: Self-pay | Admitting: Pharmacist

## 2017-11-17 ENCOUNTER — Other Ambulatory Visit: Payer: Self-pay | Admitting: *Deleted

## 2017-11-17 NOTE — Patient Outreach (Signed)
[facility]) Care Management   11/17/2017  Joann Gutierrez 09-24-1945 [id number]  Joann Gutierrez is an 72 y.o. female  Subjective:   Member alert and oriented x3, denies complaints of pain or discomfort.  Medications now through pill packaging.  Has upcoming appointment with primary MD.  Objective:   Review of Systems  Constitutional: Negative.   HENT: Negative.   Eyes: Negative.   Respiratory: Negative.   Cardiovascular: Negative.   Gastrointestinal: Negative.   Genitourinary: Negative.   Musculoskeletal: Negative.   Skin: Negative.   Neurological: Negative.   Endo/Heme/Allergies: Negative.   Psychiatric/Behavioral: Negative.     Physical Exam  Constitutional: She is oriented to person, place, and time. She appears well-developed and well-nourished.  Neck: Normal range of motion.  Cardiovascular: Normal rate, regular rhythm and normal heart sounds.  Respiratory: Effort normal and breath sounds normal.  GI: Soft. Bowel sounds are normal.  Musculoskeletal: Normal range of motion.  Neurological: She is alert and oriented to person, place, and time.  Skin: Skin is warm and dry.   BP (!) 163/87   Pulse 65   Resp 19   SpO2 97%   Encounter Medications:   Outpatient Encounter Medications as of 11/17/2017  Medication Sig Note  . albuterol (PROVENTIL HFA;VENTOLIN HFA) 108 (90 Base) MCG/ACT inhaler Inhale 2 puffs into the lungs every 6 (six) hours as needed for wheezing or shortness of breath.   Marland Kitchen aspirin EC 81 MG tablet Take 81 mg by mouth daily.   Marland Kitchen azelastine (ASTELIN) 0.1 % nasal spray 1 spray 2 (two) times daily. Use in each nostril as directed   . bisoprolol (ZEBETA) 5 MG tablet Take 1 tablet (5 mg total) by mouth 2 (two) times daily.   . calcium carbonate (OSCAL) 1500 (600 Ca) MG TABS tablet Take 600 mg of elemental calcium by mouth daily with breakfast.   . donepezil (ARICEPT) 10 MG tablet Take 10 mg by mouth at bedtime.   Marland Kitchen ezetimibe  (ZETIA) 10 MG tablet Take 10 mg by mouth daily.    . fluticasone (FLONASE) 50 MCG/ACT nasal spray USE TWO SPRAYS INTO BOTH NOSTRILS DAILY   . fluticasone furoate-vilanterol (BREO ELLIPTA) 100-25 MCG/INH AEPB Inhale 1 puff into the lungs daily.   . furosemide (LASIX) 80 MG tablet Take 80 mg by mouth 2 (two) times daily.   Marland Kitchen gabapentin (NEURONTIN) 100 MG capsule Take 1 capsule (100 mg total) by mouth 2 (two) times daily.   Marland Kitchen guaiFENesin (MUCINEX) 600 MG 12 hr tablet Take 2 tablets (1,200 mg total) by mouth 2 (two) times daily as needed for cough or to loosen phlegm.   . insulin aspart (NOVOLOG) 100 UNIT/ML injection Inject 0-9 Units into the skin 3 (three) times daily with meals. CBG < 70: implement hypoglycemia protocol CBG 70 - 120: 0 units CBG 121 - 150: 1 unit CBG 151 - 200: 2 units CBG 201 - 250: 3 units CBG 251 - 300: 5 units CBG 301 - 350: 7 units CBG 351 - 400: 9 units CBG > 400: call MD.   . insulin aspart (NOVOLOG) 100 UNIT/ML injection Inject 5 Units into the skin 3 (three) times daily with meals.   Marland Kitchen LEVEMIR FLEXTOUCH 100 UNIT/ML Pen Inject 18 Units into the skin daily. 10/20/2017: 15 units daily  . meclizine (ANTIVERT) 25 MG tablet Take 1 tablet (25 mg total) by mouth 3 (three) times daily as needed for dizziness.   . montelukast (SINGULAIR) 10 MG tablet Take  1 tablet (10 mg total) by mouth daily.   . nepafenac (ILEVRO) 0.3 % ophthalmic suspension 1 drop daily.   . nitroGLYCERIN (NITROSTAT) 0.4 MG SL tablet PLACE ONE TABLET UNDER THE TONGUE EVERY FIVE MINUTES AS NEEDED FOR CHEST PAIN   . nystatin (NYSTATIN) powder Apply topically 4 (four) times daily. Apply to abdominal fold daily PRN   . omeprazole (PRILOSEC) 20 MG capsule Take 20 mg by mouth 2 (two) times daily before a meal.   . OXYGEN Inhale 2 L into the lungs continuous. CPAP with oxygen at bedtime    . raloxifene (EVISTA) 60 MG tablet Take 60 mg by mouth every morning.    . TRUEPLUS PEN NEEDLES 32G X 4 MM MISC INJECT THREE  TIMES A DAY AS DIRECTED 07/11/2017: With Levemir  . Vitamin D, Ergocalciferol, (DRISDOL) 50000 UNITS CAPS capsule Take 50,000 Units by mouth every Monday.     No facility-administered encounter medications on file as of 11/17/2017.     Functional Status:   In your present state of health, do you have any difficulty performing the following activities: 10/20/2017 09/05/2017  Hearing? N Y  Vision? Y N  Difficulty concentrating or making decisions? Y N  Walking or climbing stairs? Y Y  Dressing or bathing? Y N  Doing errands, shopping? Y N  Preparing Food and eating ? Y -  In the past six months, have you accidently leaked urine? Y -  Do you have problems with loss of bowel control? N -  Managing your Medications? Y -  Managing your Finances? Y -  Housekeeping or managing your Housekeeping? Y -  Some recent data might be hidden    Fall/Depression Screening:    Fall Risk  10/20/2017 06/27/2017 12/21/2015  Falls in the past year? Yes Yes No  Number falls in past yr: 2 or more 1 -  Injury with Fall? No No -  Risk Factor Category  High Fall Risk - -  Risk for fall due to : History of fall(s) History of fall(s);Impaired vision;Impaired balance/gait;Medication side effect;Impaired mobility -  Follow up Education provided;Falls prevention discussed Falls evaluation completed;Falls prevention discussed -  Comment - - -   PHQ 2/9 Scores 10/20/2017 06/27/2017 12/21/2015 12/11/2015 12/06/2015 08/03/2015 06/05/2015  PHQ - 2 Score 0 0 0 1 0 0 0  PHQ- 9 Score - - - - - - -  Exception Documentation - - - - - - -    Assessment:    Met with member and friend, [name].  Joint visit with [facility] pharmacist C. Summe.  They both still have some confusion regarding medications, see pharmacy note for details.  Blood pressure remain elevated, state she and [name] will work more on [employer].  Denies any urgent concerns at this time, advised to contact this care manager with questions.  Plan:   Will follow up with  member within the next month.  THN CM Care Plan Problem One     Most Recent Value  Care Plan Problem One  Knowledge deficit related to management of chronic medical conditions   Role Documenting the Problem One  Care Management [place] for Problem One  Active  [facility] Long Term Goal   Member will will report blood pressure consistently at or below goal (140/90) over the next 45 days  [place] Term Goal Start Date  11/17/17  Interventions for Problem One Long Term Goal  Discussed with member and friend management of hypertension, including diet and medication  management  THN CM Short Term Goal #1   Member will report compliance with pill packaging and over all medication adherence over the next 4 weeks  THN CM Short Term Goal #1 Start Date  11/17/17  Interventions for Short Term Goal #1  Medications reviewed in the home with [facility] pharmacist, instructed on correct use of all medications, will have blood pressure medication delivered today.  THN CM Short Term Goal #2   Member will report monitoring blood pressure daily over the next 4 weeks  THN CM Short Term Goal #2 Start Date  11/17/17  Interventions for Short Term Goal #2  Educated member and friend on proper technique for blood pressure monitoring, provided with calendar to include recording logs     [name], Therapist, sports, MSN [place] Manager [phone]

## 2017-11-17 NOTE — Progress Notes (Signed)
When I review her chart, I see that Dr. Percival Spanish had her taking Lasix 80 mg twice daily in 2015. Ever since that time, she has either been on furosemide 80 mg daily and taking it twice daily or furosemide 80 mg twice daily and taking it once daily. She is also followed by Dr. Posey Pronto, as her nephrologist, he should really be the one managing her diuretics. After her recent office visit with me, we contacted Dr. Serita Grit office to try to get her an appointment sooner than January, when she is already scheduled to see him. If we prescribed the Lasix, she should take 80 mg every day and every other day take a second dose. However, if Dr. Posey Pronto is prescribing the Lasix, I would defer to him and request that you contact his office for guidance. Thank you

## 2017-11-17 NOTE — Patient Outreach (Addendum)
[place] [facility]) Care Management  [place]   11/17/2017  TEKEYA GEFFERT 07-07-1945 [id number]   Reason for referral: medication management, assistance with transition to compliance packs  PMHx: Former smoker, asthma, COPD, OSA, OHS, CKD-III, T2DM, postherpetic neuralgia, GERD, gastroparesis, esophageal dysmotility, morbid obesity, HTN, HLD, CAD, hx CVA, depression, osteoarthritis, poor health literacy.  Noted inhaler device technique ~25% per office notes Jan 2019. Primary care referral placed 10/15/2017.    Outreach:  Successful home visit with Ms. Razavi and her caregiver, [name], at their home along with [address] to follow up on compliance packs.  Patient started on compliance packs 1 week ago and reports she understands how to use the packs.  [name] reports it is going well and that he checks to make sure patient is taking medications as instructed.  He also assists with bringing patient her OTC medications and insulin.   Patient has a bag full of old pill bottles that she is confused on whether she should be taking or not.   Per patient, she has an appointment with nephrologist on Wednesday.  Patient seen by cardiology last week and bisoprolol dose increased however medication at home not updated.    Objective: Lab Results  Component Value Date   CREATININE 1.98 (H) 10/24/2017   CREATININE 1.97 (H) 09/25/2017   CREATININE 2.59 (H) 09/12/2017    Lab Results  Component Value Date   HGBA1C 11.5 (H) 09/06/2017    Lipid Panel     Component Value Date/Time   CHOL 238 (H) 09/06/2017 0308   TRIG 174 (H) 09/06/2017 0308   HDL 37 (L) 09/06/2017 0308   CHOLHDL 6.4 09/06/2017 0308   VLDL 35 09/06/2017 0308   LDLCALC 166 (H) 09/06/2017 0308   LDLDIRECT 190.0 01/02/2009 1101    BP Readings from Last 3 Encounters:  11/17/17 (!) 163/87  11/14/17 (!) 173/82  11/04/17 (!) 184/94    Allergies  Allergen Reactions  . Morphine And Related Other  (See Comments)    Family request not to be given, reports pt does not wake up when given   . Promethazine Hcl Other (See Comments)    REACTION: lethargy    Medications Reviewed Today    Reviewed by Lonn Georgia, PA-C (Physician Assistant Certified) on 07/37/10 at 1355  Med List Status: <None>  Medication Order Taking? Sig Documenting Provider Last Dose Status Informant  albuterol (PROVENTIL HFA;VENTOLIN HFA) 108 (90 Base) MCG/ACT inhaler [id number] Yes Inhale 2 puffs into the lungs every 6 (six) hours as needed for wheezing or shortness of breath. Lauraine Rinne, NP Taking Active   aspirin EC 81 MG tablet [id number] Yes Take 81 mg by mouth daily. [provider] Taking Active Self  azelastine (ASTELIN) 0.1 % nasal spray [id number] Yes 1 spray 2 (two) times daily. Use in each nostril as directed [provider] Taking Active   bisoprolol (ZEBETA) 5 MG tablet [id number] Yes Take 1 tablet (5 mg total) by mouth daily. Tanda Rockers, MD Taking Active   calcium carbonate (OSCAL) 1500 (600 Ca) MG TABS tablet [id number] Yes Take 600 mg of elemental calcium by mouth daily with breakfast. [provider] Taking Active Self  donepezil (ARICEPT) 10 MG tablet [id number] Yes Take 10 mg by mouth at bedtime. [provider] Taking Active   ezetimibe (ZETIA) 10 MG tablet [id number] Yes Take 10 mg by mouth daily.  [provider] Taking Active Self  fluticasone (FLONASE) 50 MCG/ACT nasal spray  [id number] Yes USE TWO SPRAYS INTO BOTH NOSTRILS DAILY [name], MD Taking Active Self  fluticasone furoate-vilanterol (BREO ELLIPTA) 100-25 MCG/INH AEPB [id number] Yes Inhale 1 puff into the lungs daily. Lauraine Rinne, NP Taking Active   furosemide (LASIX) 80 MG tablet [id number] Yes Take 80 mg by mouth 2 (two) times daily. [provider] Taking Active   gabapentin (NEURONTIN) 100 MG capsule [id number] Yes Take 1 capsule (100 mg total) by mouth 2 (two) times daily.  Modena Jansky, MD Taking Active            Med Note Harrel Lemon, [place] T   Fri Nov 14, 2017  1:16 PM)    guaiFENesin (MUCINEX) 600 MG 12 hr tablet [id number] Yes Take 2 tablets (1,200 mg total) by mouth 2 (two) times daily as needed for cough or to loosen phlegm. [name], MD Taking Active Self  insulin aspart (NOVOLOG) 100 UNIT/ML injection [id number] Yes Inject 0-9 Units into the skin 3 (three) times daily with meals. CBG < 70: implement hypoglycemia protocol CBG 70 - 120: 0 units CBG 121 - 150: 1 unit CBG 151 - 200: 2 units CBG 201 - 250: 3 units CBG 251 - 300: 5 units CBG 301 - 350: 7 units CBG 351 - 400: 9 units CBG > 400: call MD. Modena Jansky, MD Taking Active   insulin aspart (NOVOLOG) 100 UNIT/ML injection [id number] Yes Inject 5 Units into the skin 3 (three) times daily with meals. Modena Jansky, MD Taking Active   LEVEMIR FLEXTOUCH 100 UNIT/ML Pen [id number] Yes Inject 18 Units into the skin daily. Modena Jansky, MD Taking Active            Med Note Orene Desanctis, MONICA   Mon Oct 20, 2017 11:49 AM) 15 units daily  meclizine (ANTIVERT) 25 MG tablet [id number] Yes Take 1 tablet (25 mg total) by mouth 3 (three) times daily as needed for dizziness. Julianne Rice, MD Taking Active Self  montelukast (SINGULAIR) 10 MG tablet [id number] Yes Take 1 tablet (10 mg total) by mouth daily. Lauraine Rinne, NP Taking Active   nepafenac (ILEVRO) 0.3 % ophthalmic suspension [id number] Yes 1 drop daily. [provider] Taking Active   nitroGLYCERIN (NITROSTAT) 0.4 MG SL tablet [id number] Yes PLACE ONE TABLET UNDER THE TONGUE EVERY FIVE MINUTES AS NEEDED FOR CHEST PAIN Minus Breeding, MD Taking Active Self           Med Note (CAULFIELD, ASHLEY L   Mon Oct 06, 2017  9:28 AM)    nystatin (NYSTATIN) powder [id number] Yes Apply topically 4 (four) times daily. Apply to abdominal fold daily PRN [provider] Taking Active   omeprazole (PRILOSEC) 20 MG capsule [id number] Yes Take 20 mg  by mouth 2 (two) times daily before a meal. [provider] Taking Active   OXYGEN [id number] Yes Inhale 2 L into the lungs continuous. CPAP with oxygen at bedtime  [provider] Taking Active Self  raloxifene (EVISTA) 60 MG tablet [id number] Yes Take 60 mg by mouth every morning.  [provider] Taking Active Self  TRUEPLUS PEN NEEDLES 32G X 4 MM MISC [id number] Yes INJECT THREE TIMES A DAY AS DIRECTED [provider] Taking Active Self           Med Note Kary Kos, JULIE E   Fri Jul 11, 2017  2:21 PM) With Levemir  Vitamin D, Ergocalciferol, (DRISDOL) 50000 UNITS CAPS capsule [id number] Yes Take 50,000 Units by mouth every Monday.  [provider] Taking Active Self          Assessment:  Drugs sorted by system:  Neurologic/Psychologic: donepezil, gabapentin, meclizine  Cardiovascular: aspirin 81mg , bisoprolol, ezetimibe, furosemide, SL NTG  Pulmonary/Allergy: albuterol INH, fluticasone-vilanterol INH, azelastine NS, guaifenesin, montelukast  Gastrointestinal: omeprazole  Endocrine: insulin aspart, insulin degludec, raloxifene  Topical: nepafenac eye drops, nystatin powder  Vitamins/Minerals/Supplements: calcium carbonate, vitamin D  Medication Review Findings:  . Bisoprolol bottle once daily however increased to BID on 10/25 o/v with cardiology.   o Spoke with patient's pharmacy, [facility], who confirmed bottle of bisoprolol with updated SIG will be delivered to patient today between 3-6PM . Furosemide medication in packs as once daily however patient also has bottles in the home with BID dosing.  Dose per [facility] list is BID.   o Will clarify with cardiology to confirm directions.  . Omeprazole bottle in the home and on medication list but is not included in compliance packs.  o Will request PCP to send in new RX for patient.  . Novolog insulin - 2 different entries with sliding scale and scheduled coverage.    o Will reach out to  PCP to confirm dosing.  Patient would benefit from simplified insulin regimen.  . Several bottles filled 6-18 months in the past and since discontinued.   o Duplicate, expired, and discontinued medications removed from patients home with permission.  . No aspirin bottle present.  [name] giving patient daily aspirin from his own OTC bottle.  . Reviewed Breo dosing, side effects, and to rinse mouth afterwards.  Patient voiced understanding.   . Adherence much improved with compliance packs per caregiver and per visual inspection (100% of doses taken in the last week from packs).   Plan: Will await clarification on furosemide, omeprazole, and insulin  Ralene Bathe, PharmD, [place] [phone]   Addendum: Per cardiology, dose should be 80mg  once daily alternating with 80mg  BID every other day.  However, cardiology will defer to nephrology on dose.  Patient has appt on Wednesday, October 30th with nephrologist, [name].  I will f/u with nephrology and patient after appt to confirm furosemide dose.   Patient and caregiver updated today.  Bisoprolol just delivered.  They will continue to take furosemide (and other daily medications) from compliance packs and omeprazole, aspirin, and bisoprolol from bottles for now.   Goal is to have all scheduled oral medications be included in packs with next delivery in 3 weeks.    Still awaiting return call from PCP regarding insulin and omeprazole.   Ralene Bathe, PharmD,  [phone]

## 2017-11-18 ENCOUNTER — Encounter (HOSPITAL_COMMUNITY)
Admission: RE | Admit: 2017-11-18 | Discharge: 2017-11-18 | Disposition: A | Discharge status: Home / Self Care | Payer: Medicare Other | Source: Ambulatory Visit | Attending: Nephrology | Admitting: Nephrology

## 2017-11-18 VITALS — BP 162/79 | HR 56 | Resp 20

## 2017-11-18 DIAGNOSIS — Z79899 Other long term (current) drug therapy: Secondary | ICD-10-CM | POA: Diagnosis not present

## 2017-11-18 DIAGNOSIS — N183 Chronic kidney disease, stage 3 unspecified: Secondary | ICD-10-CM

## 2017-11-18 DIAGNOSIS — E1122 Type 2 diabetes mellitus with diabetic chronic kidney disease: Secondary | ICD-10-CM | POA: Diagnosis not present

## 2017-11-18 DIAGNOSIS — I5022 Chronic systolic (congestive) heart failure: Secondary | ICD-10-CM | POA: Diagnosis not present

## 2017-11-18 DIAGNOSIS — D631 Anemia in chronic kidney disease: Secondary | ICD-10-CM | POA: Diagnosis not present

## 2017-11-18 DIAGNOSIS — I2510 Atherosclerotic heart disease of native coronary artery without angina pectoris: Secondary | ICD-10-CM | POA: Diagnosis not present

## 2017-11-18 DIAGNOSIS — Z5181 Encounter for therapeutic drug level monitoring: Secondary | ICD-10-CM | POA: Diagnosis not present

## 2017-11-18 DIAGNOSIS — I130 Hypertensive heart and chronic kidney disease with heart failure and stage 1 through stage 4 chronic kidney disease, or unspecified chronic kidney disease: Secondary | ICD-10-CM | POA: Diagnosis not present

## 2017-11-18 LAB — POCT HEMOGLOBIN-HEMACUE: HEMOGLOBIN: 10.4 g/dL — AB (ref 12.0–15.0)

## 2017-11-18 MED ORDER — EPOETIN ALFA 10000 UNIT/ML IJ SOLN
INTRAMUSCULAR | Status: AC
  Administered 2017-11-18: 10000 [IU] via SUBCUTANEOUS
  Filled 2017-11-18: qty 1

## 2017-11-18 MED ORDER — EPOETIN ALFA 20000 UNIT/ML IJ SOLN
INTRAMUSCULAR | Status: AC
  Administered 2017-11-18: 20000 [IU] via SUBCUTANEOUS
  Filled 2017-11-18: qty 1

## 2017-11-18 MED ORDER — EPOETIN ALFA 40000 UNIT/ML IJ SOLN: 30000.0000 [IU] | INTRAMUSCULAR | Status: DC

## 2017-11-19 ENCOUNTER — Other Ambulatory Visit: Payer: Self-pay | Admitting: Pharmacist

## 2017-11-19 DIAGNOSIS — E1122 Type 2 diabetes mellitus with diabetic chronic kidney disease: Secondary | ICD-10-CM | POA: Diagnosis not present

## 2017-11-19 DIAGNOSIS — N184 Chronic kidney disease, stage 4 (severe): Secondary | ICD-10-CM | POA: Diagnosis not present

## 2017-11-19 DIAGNOSIS — I129 Hypertensive chronic kidney disease with stage 1 through stage 4 chronic kidney disease, or unspecified chronic kidney disease: Secondary | ICD-10-CM | POA: Diagnosis not present

## 2017-11-19 NOTE — Patient Outreach (Signed)
[place] [facility]) Care Management  11/19/2017  Joann Gutierrez 1945-04-12 [id number]  Incoming call from Dr. Lina Sar office.  Ok to discontinue orders for Novolog sliding scale and continue 5 units TID with meals.  Confirmed long-acting insulin dose 18 units daily.  Omeprazole order sent into pharmacy.   Plan: F/u with patient tomorrow after nephrology appointment  Ralene Bathe, PharmD, [place] [phone]

## 2017-11-20 ENCOUNTER — Ambulatory Visit (INDEPENDENT_AMBULATORY_CARE_PROVIDER_SITE_OTHER): Payer: Medicare Other | Admitting: Internal Medicine

## 2017-11-20 ENCOUNTER — Other Ambulatory Visit: Payer: Self-pay | Admitting: Pharmacist

## 2017-11-20 ENCOUNTER — Other Ambulatory Visit (INDEPENDENT_AMBULATORY_CARE_PROVIDER_SITE_OTHER): Payer: Medicare Other

## 2017-11-20 ENCOUNTER — Ambulatory Visit (INDEPENDENT_AMBULATORY_CARE_PROVIDER_SITE_OTHER)
Admission: RE | Admit: 2017-11-20 | Discharge: 2017-11-20 | Disposition: A | Discharge status: Home / Self Care | Payer: Medicare Other | Source: Ambulatory Visit | Attending: Internal Medicine | Admitting: Internal Medicine

## 2017-11-20 ENCOUNTER — Encounter: Payer: Self-pay | Admitting: Internal Medicine

## 2017-11-20 VITALS — BP 162/86 | HR 54 | Temp 97.8°F | Ht 64.5 in | Wt 276.0 lb

## 2017-11-20 DIAGNOSIS — I2510 Atherosclerotic heart disease of native coronary artery without angina pectoris: Secondary | ICD-10-CM | POA: Diagnosis not present

## 2017-11-20 DIAGNOSIS — D631 Anemia in chronic kidney disease: Secondary | ICD-10-CM | POA: Diagnosis not present

## 2017-11-20 DIAGNOSIS — J449 Chronic obstructive pulmonary disease, unspecified: Secondary | ICD-10-CM

## 2017-11-20 DIAGNOSIS — I130 Hypertensive heart and chronic kidney disease with heart failure and stage 1 through stage 4 chronic kidney disease, or unspecified chronic kidney disease: Secondary | ICD-10-CM | POA: Diagnosis not present

## 2017-11-20 DIAGNOSIS — E1122 Type 2 diabetes mellitus with diabetic chronic kidney disease: Secondary | ICD-10-CM | POA: Diagnosis not present

## 2017-11-20 DIAGNOSIS — R0609 Other forms of dyspnea: Secondary | ICD-10-CM

## 2017-11-20 DIAGNOSIS — N183 Chronic kidney disease, stage 3 (moderate): Secondary | ICD-10-CM | POA: Diagnosis not present

## 2017-11-20 DIAGNOSIS — J4489 Other specified chronic obstructive pulmonary disease: Secondary | ICD-10-CM

## 2017-11-20 DIAGNOSIS — I10 Essential (primary) hypertension: Secondary | ICD-10-CM

## 2017-11-20 DIAGNOSIS — R079 Chest pain, unspecified: Secondary | ICD-10-CM | POA: Diagnosis not present

## 2017-11-20 DIAGNOSIS — R05 Cough: Secondary | ICD-10-CM | POA: Diagnosis not present

## 2017-11-20 DIAGNOSIS — I5022 Chronic systolic (congestive) heart failure: Secondary | ICD-10-CM | POA: Diagnosis not present

## 2017-11-20 DIAGNOSIS — R0602 Shortness of breath: Secondary | ICD-10-CM | POA: Diagnosis not present

## 2017-11-20 LAB — CBC WITH DIFFERENTIAL/PLATELET
Basophils Absolute: 0.1 10*3/uL (ref 0.0–0.1)
Basophils Relative: 1.2 % (ref 0.0–3.0)
EOS ABS: 0 10*3/uL (ref 0.0–0.7)
EOS PCT: 0.7 % (ref 0.0–5.0)
HCT: 32.1 % — ABNORMAL LOW (ref 36.0–46.0)
Hemoglobin: 10 g/dL — ABNORMAL LOW (ref 12.0–15.0)
LYMPHS ABS: 2 10*3/uL (ref 0.7–4.0)
Lymphocytes Relative: 28.5 % (ref 12.0–46.0)
MCHC: 31.2 g/dL (ref 30.0–36.0)
MCV: 71.8 fl — ABNORMAL LOW (ref 78.0–100.0)
MONO ABS: 1.2 10*3/uL — AB (ref 0.1–1.0)
Monocytes Relative: 16 % — ABNORMAL HIGH (ref 3.0–12.0)
NEUTROS PCT: 53.6 % (ref 43.0–77.0)
Neutro Abs: 3.9 10*3/uL (ref 1.4–7.7)
Platelets: 210 10*3/uL (ref 150.0–400.0)
RBC: 4.47 Mil/uL (ref 3.87–5.11)
RDW: 19.9 % — ABNORMAL HIGH (ref 11.5–15.5)
WBC: 7.2 10*3/uL (ref 4.0–10.5)

## 2017-11-20 LAB — BASIC METABOLIC PANEL
BUN: 40 mg/dL — AB (ref 6–23)
CALCIUM: 8.8 mg/dL (ref 8.4–10.5)
CO2: 31 mEq/L (ref 19–32)
CREATININE: 1.92 mg/dL — AB (ref 0.40–1.20)
Chloride: 109 mEq/L (ref 96–112)
GFR: 32.97 mL/min — AB (ref >60.00)
GLUCOSE: 163 mg/dL — AB (ref 70–99)
POTASSIUM: 3.9 meq/L (ref 3.5–5.1)
Sodium: 145 mEq/L (ref 135–145)

## 2017-11-20 NOTE — Progress Notes (Signed)
Spoke with pt and notified of results per Dr. Wert. Pt verbalized understanding and denied any questions. 

## 2017-11-20 NOTE — Patient Instructions (Addendum)
Continue BREO each am    Only use your albuterol (ventolin) as a rescue medication to be used if you can't catch your breath by resting or doing a relaxed purse lip breathing pattern.  - The less you use it, the better it will work when you need it. - Ok to use up to 2 puffs  every 4 hours if you must but call for immediate appointment if use goes up over your usual need - Don't leave home without it !!  (think of it like the spare tire for your car)   Work on inhaler technique with your Ventolin:  relax and gently blow all the way out then take a nice smooth deep breath back in, triggering the inhaler at same time you start breathing in.  Hold for up to 5 seconds if you can. Blow out thru nose. Rinse and gargle with water when done   Please remember to go to the lab and x-ray department downstairs in the basement  for your tests - we will call you with the results when they are available.      Keep appt with Dr Halford Chessman and we'll see you back if needed after that   - bring all meds to all visits - add Maint finding is fe def > f/u PCP and Dr Halford Chessman, f/u here prn

## 2017-11-20 NOTE — Patient Outreach (Signed)
[place] [facility]) Care Management  [place] 11/20/2017  Joann Gutierrez 07-Apr-1945 [id number]  Reason for call: f/u on medication clarifications with furosemide, omeprazole, and novolog.   PCP office clarified that Novolog SSI can be discontinued.  Patient to continue using 5 units with meals.   Successful call to [facility], patient's caregiver.  Joann Gutierrez reports that patient saw nephrologist yesterday and that furosemide dose adjusted and losartan 50mg  QHS added.  He states patient also saw lung doctor today and was told it was important for her to start taking the [facility] daily rather than PRN.  He states patient is taking medications from packs as directed and he is giving her medication from pill-bottles too (bisoprolol, omeprazole, aspirin).  I also updated Joann Gutierrez on stopping SSI regimen and only using Novolog 5 units TID with meals.  He voiced understanding.   Call placed to [facility] pharmacy to inquire if packs can be re-done due to several medication changes.  Pharmacy states they cannot repack due to cost.  They verified new furosemide dose = 160mg  QAM, 80mg  qPM x 1 week, then 80mg  BID.  No losartan medication called in.  Pharmacy will send patient evening lasix dose in separate daily pack and extra pills in the bottle for temporary increase in AM dose.  New packs will include bisoprolol, omeprazole, updated furosemide dose.   Call placed to [facility].  Message left to clarify medication adjustments made yesterday by PA Ernest Haber and to see if patient to start losartan.   Outreach call again to Joann Gutierrez to update him on pharmacy delivery and review furosemide dosing.  Joann Gutierrez verbalized understanding.    Plan: I will await call back from [place].    I will f/u with Joann Gutierrez next Wednesday when furosemide dose changes and will plan home visit again when new packs are delivered in 3 weeks.   Ralene Bathe, PharmD, [place] [phone]

## 2017-11-20 NOTE — Progress Notes (Signed)
Subjective:   Patient ID: Joann Gutierrez, female    DOB: Dec 24, 1945    MRN: [id number]    Brief patient profile:  19 yobf remote smoker - quit 03/1980 with  MO, asthma, OSA, OHS, and rt hemidiaphragm elevation first noted November 2011.     TESTS: PSG 04/28/09 >> AHI 11, SpO2 low 68% Spirometry 07/05/10 >> FEV1 1.30 (64%), FEV1% 80 >> coughing during test Echo 05/21/11 >> EF 55 to 60%, mild LVH, mild/mod TR, PAS 45 mmHg ONO with CPAP and room air 10/22/12 >> Test time 10 hrs 59 min. Mean SpO2 92.4%, low SpO2 75%. Spent 16 min with SpO2 < 89%. RAST 12/11/12 >> negative, IgE 63.3 Echo 02/04/13 >> mod LVH, EF 65 to 16%, grade 1 diastolic dysfx, PAS 42 mmHg Lt, Rt Heart cath 03/03/13 >> RA 10, RV 38/7, PA 41/16 28, PCWP 13, LV 142/5, AO 137/76 PFT 03/17/13 >> FEV1 1.27 (67%), FEV1% 84, TLC 4.06 (80%), DLCO 57%, borderline BD response      05/20/2014 acute  ov/Wert re: aeAB  Chief Complaint  Patient presents with  . Acute Visit    Pt c/o cough, wheezing, increased SOB and chest tightness for the past 5 days. Cough is prod with moderate green sputum. She is using albuterol inhaler 2 x daily on average.  normally uses nebulizer twice daily but has not worked for a month "making an odd sound" Thoroughly confused with meds/ names/ timing maint vs prn and worse at hs despite cpap. Still comfortable at rest even s saba  rec Augmentin 875 mg take one pill twice daily  X 10 days - take at breakfast and supper with large glass of water.  It would help reduce the usual side effects (diarrhea and yeast infections) if you ate cultured yogurt at lunch.  Prednisone 10 mg take  4 each am x 2 days,   2 each am x 2 days,  1 each am x 2 days and stop If needed nebulizer up to every 4 hours if needed for breathlessness  For cough use flutter valve      09/22/2014 f/u ov/Wert re: uacs  /gastroparesis related ?  With no improvement in sob since last eval  Chief Complaint  Patient presents with  . Acute  Visit    Pt c/o increased SOB "since the last time I was here". She also c/o difficulty swallowing and getting choked on food for the past month.   all  symptoms resolve with cpap day or noct but doe is persistent daily then choking worse x one month Not at all clear what meds she takes / was on reglan not clear when she stopped it rec metaclopromide 10 mg (reglan) is 10 mg before meals and at bedtime Omeprazole 20 mg Take 30- 60 min before your first and last meals of the day  GERD diet     07/13/2015  f/u ov/Wert re: AB vs uacs / obesity / deconditioning  Chief Complaint  Patient presents with  . Follow-up    Breathing is "doing a little bit better"- no new co's.  She uses albuterol 2 x daily on average.   MMRC3 = can't walk 100 yards even at a slow pace at a flat grade s stopping due to sob  - can't do [employer] consistently   Very confused with meds  rec No change in medications/ keep working on weight loss   10/31/16 Sood ov  rec Can use mucinex twice per day  as needed to help loosen phlegm in your chest Dulera two puffs twice per day and rinse mouth after each use Singulair 10 mg pill nightly Ventolin two puffs every 6 hours as needed for cough, wheeze, or chest congestion      02/06/2017 acute extended ov/Wert re:  Acute cough / pt with ? Severity asthma and very poor baseline hfa Chief Complaint  Patient presents with  . Acute Visit    productive cough,green,white,thick mucus,SOB w/ activity,headaches,chest,nasal congestion,sore throat,has noticed feet/ankle swelling,has taken mucinex,robitussin DM and has not felt better  best days can do [employer] leaing on cart/ freq stop  = MMRC3 = can't walk 100 yards even at a slow pace at a flat grade s stopping due to sob   - ok resting sitting/ lie down R side at 30 degrees  Cough immediately worse lying down - also on 02 at hs 2lpm  Much worse x 5 days with now green mucu prod esp in am and sore throat/ still not sob at rest  though sitting rec Prednisone Take 4 for three days 3 for three days 2 for three days 1 for three days and stop   Plan A = Automatic = Dulera 100 Take 2 puffs first thing in am and then another 2 puffs about 12 hours later.  Plan B = Backup Only use your albuterol as a rescue medication Plan C = Crisis - only use your albuterol nebulizer if you first try Plan B and it fails to help > ok to use the nebulizer up to every 4 hours but if start needing it regularly call for immediate appointment Work on inhaler technique:  relax and gently blow all the way out then take a nice smooth deep breath back in, triggering the inhaler at same time you start breathing in.  Hold for up to 5 seconds if you can. Blow out thru nose. Rinse and gargle with water when done If not able to do better with inhalers may need nebulizer next     10/06/2017  Acute  ov/Wert re: sob / cough/ totally confused with meds ? On normodyne and coreg? Chief Complaint  Patient presents with  . Acute Visit    c/o worsening sob, prod cough with green mucus, chest tightness X4 days.    Dyspnea:  MMRC4  = sob if tries to leave home or while getting dressed   Cough: acutely worse x 4 days with subj wheezing Sleeping: 30 degrees lots of cough  SABA use: has not used on day 02: 2lpm prn    rec Stop normodyne (labetolol)  Or coreg/corevidol (both of these block the benefit of your albuterol inhaler and nebulizer  Start bisoprolol 5 mg daily        11/20/2017  f/u ov/Wert re:  Unexplained sob / did not bring meds as req  Chief Complaint  Patient presents with  . Follow-up    Breathing progressively worse since the last visit. She is coughing up some green sputum/ She was winded walking from lobby to nearest exam room today. She is using her albuterol inhaler about 2 x per day on average.   Dyspnea:  50 ft  Cough: variable  Sleeping: sleeping flat on cpap "real noisy" s am flare of any resp symptoms SABA use: none this am    02: wears it when feels like     No obvious day to day or daytime variability or assoc excess/ purulent sputum or mucus plugs or hemoptysis  or cp or chest tightness, subjective wheeze or overt sinus or hb symptoms.   Sleeping on cpap as above  without nocturnal  or early am exacerbation  of respiratory  c/o's or need for noct saba. Also denies any obvious fluctuation of symptoms with weather or environmental changes or other aggravating or alleviating factors except as outlined above   No unusual exposure hx or h/o childhood pna/ asthma or knowledge of premature birth.  Current Allergies, Complete Past Medical History, Past Surgical History, Family History, and Social History were reviewed in [employer] record.  ROS  The following are not active complaints unless bolded Hoarseness, sore throat, dysphagia, dental problems, itching, sneezing,  nasal congestion or discharge of excess mucus or purulent secretions, ear ache,   fever, chills, sweats, unintended wt loss or wt gain, classically pleuritic or exertional cp,  orthopnea pnd or arm/hand swelling  or leg swelling, presyncope, palpitations, abdominal pain, anorexia, nausea, vomiting, diarrhea  or change in bowel habits or change in bladder habits, change in stools or change in urine, dysuria, hematuria,  rash, arthralgias, visual complaints, headache, numbness, weakness or ataxia or problems with walking or coordination,  change in mood or  memory.        Current Meds  Medication Sig  . albuterol (PROVENTIL HFA;VENTOLIN HFA) 108 (90 Base) MCG/ACT inhaler Inhale 2 puffs into the lungs every 6 (six) hours as needed for wheezing or shortness of breath.  Marland Kitchen aspirin EC 81 MG tablet Take 81 mg by mouth daily.  Marland Kitchen azelastine (ASTELIN) 0.1 % nasal spray 1 spray 2 (two) times daily. Use in each nostril as directed  . bisoprolol (ZEBETA) 5 MG tablet Take 1 tablet (5 mg total) by mouth 2 (two) times daily.  . calcium carbonate  (OSCAL) 1500 (600 Ca) MG TABS tablet Take 600 mg of elemental calcium by mouth daily with breakfast.  . donepezil (ARICEPT) 10 MG tablet Take 10 mg by mouth at bedtime.  Marland Kitchen ezetimibe (ZETIA) 10 MG tablet Take 10 mg by mouth daily.   . fluticasone (FLONASE) 50 MCG/ACT nasal spray USE TWO SPRAYS INTO BOTH NOSTRILS DAILY  . fluticasone furoate-vilanterol (BREO ELLIPTA) 100-25 MCG/INH AEPB Inhale 1 puff into the lungs daily.  . furosemide (LASIX) 80 MG tablet Take 80 mg by mouth 2 (two) times daily.  Marland Kitchen gabapentin (NEURONTIN) 100 MG capsule Take 1 capsule (100 mg total) by mouth 2 (two) times daily.  Marland Kitchen guaiFENesin (MUCINEX) 600 MG 12 hr tablet Take 2 tablets (1,200 mg total) by mouth 2 (two) times daily as needed for cough or to loosen phlegm.  . insulin aspart (NOVOLOG) 100 UNIT/ML injection Inject 5 Units into the skin 3 (three) times daily with meals.  Marland Kitchen LEVEMIR FLEXTOUCH 100 UNIT/ML Pen Inject 18 Units into the skin daily.  . meclizine (ANTIVERT) 25 MG tablet Take 1 tablet (25 mg total) by mouth 3 (three) times daily as needed for dizziness.  . montelukast (SINGULAIR) 10 MG tablet Take 1 tablet (10 mg total) by mouth daily.  . nepafenac (ILEVRO) 0.3 % ophthalmic suspension 1 drop daily.  . nitroGLYCERIN (NITROSTAT) 0.4 MG SL tablet PLACE ONE TABLET UNDER THE TONGUE EVERY FIVE MINUTES AS NEEDED FOR CHEST PAIN  . nystatin (NYSTATIN) powder Apply topically 4 (four) times daily. Apply to abdominal fold daily PRN  . omeprazole (PRILOSEC) 20 MG capsule Take 20 mg by mouth 2 (two) times daily before a meal.  . OXYGEN Inhale 2 L into the lungs continuous. CPAP  with oxygen at bedtime   . raloxifene (EVISTA) 60 MG tablet Take 60 mg by mouth every morning.   . TRUEPLUS PEN NEEDLES 32G X 4 MM MISC INJECT THREE TIMES A DAY AS DIRECTED  . Vitamin D, Ergocalciferol, (DRISDOL) 50000 UNITS CAPS capsule Take 50,000 Units by mouth every Monday.                  Objective:   Physical Exam  amb obese bf  able to get table s assistance (improved vs last exam )    09/22/2014          288 >  07/13/2015   279 > 02/06/2017  259 > 10/06/2017  266 >  11/20/2017  276     05/20/14 278 lb (126.1 kg)  04/06/14 267 lb (121.11 kg)  03/04/14 267 lb 12.8 oz (121.473 kg)      HEENT: nl dentition, turbinates bilaterally, and oropharynx. Nl external ear canals without cough reflex   NECK :  without JVD/Nodes/TM/ nl carotid upstrokes bilaterally   LUNGS: no acc muscle use,  Nl contour chest with slt distant bs  R>L     without cough on insp or exp maneuvers   CV:  RRR  no s3 or murmur or increase in P2, and no edema   ABD:  Quit soft and nontender with nl inspiratory excursion in the supine position. No bruits or organomegaly appreciated, bowel sounds nl  MS:  Nl gait/ ext warm without deformities, calf tenderness, cyanosis or clubbing No obvious joint restrictions   SKIN: warm and dry without lesions    NEURO:  alert, approp, nl sensorium with  no motor or cerebellar deficits apparent.         CXR PA and Lateral:   11/20/2017 :    I personally reviewed images and agree with radiology impression as follows:   1.  Mild right base atelectasis/infiltrate. 2.  Stable cardiomegaly. My review:  R HD elevation since at least  02/04/14      Labs ordered/ reviewed:      Chemistry      Component Value Date/Time   NA 145 11/20/2017 1020   K 3.9 11/20/2017 1020   CL 109 11/20/2017 1020   CO2 31 11/20/2017 1020   BUN 40 (H) 11/20/2017 1020   CREATININE 1.92 (H) 11/20/2017 1020   CREATININE 1.36 (H) 07/24/2011 0925      Component Value Date/Time   CALCIUM 8.8 11/20/2017 1020   CALCIUM 8.7 10/24/2017 1009   ALKPHOS 85 09/05/2017 0339   AST 14 (L) 09/05/2017 0339   ALT 8 09/05/2017 0339   BILITOT 0.9 09/05/2017 0339        Lab Results  Component Value Date   WBC 7.2 11/20/2017   HGB 10.0 (L) 11/20/2017   HCT 32.1 (L) 11/20/2017   MCV 71.8 (L) 11/20/2017   PLT 210.0 11/20/2017         EOS                                                             0.0  11/20/2017       Lab Results  Component Value Date   TSH 2.748 09/05/2017     Lab Results  Component Value Date   PROBNP 65.0 05/20/2014        Labs ordered 11/20/2017    Allergy profile       Assessment & Plan:

## 2017-11-21 ENCOUNTER — Encounter (HOSPITAL_COMMUNITY): Payer: Medicare Other

## 2017-11-21 LAB — RESPIRATORY ALLERGY PROFILE REGION II ~~LOC~~
Allergen, A. alternata, m6: <0.1 kU/L
Allergen, Cedar tree, t12: <0.1 kU/L
Allergen, Comm Silver Birch, t9: <0.1 kU/L
Allergen, Cottonwood, t14: <0.1 kU/L
Allergen, Mouse Urine Protein, e78: <0.1 kU/L
Allergen, Mulberry, t76: <0.1 kU/L
Allergen, P. notatum, m1: <0.1 kU/L
Aspergillus fumigatus, m3: <0.1 kU/L
Bermuda Grass: <0.1 kU/L
CLASS: 0
CLASS: 0
CLASS: 0
CLASS: 0
CLASS: 0
CLASS: 0
CLASS: 0
CLASS: 0
CLASS: 0
CLASS: 0
CLASS: 1
COCKROACH: 0.4 kU/L — AB
COMMON RAGWEED (SHORT) (W1) IGE: <0.1 kU/L
Cat Dander: <0.1 kU/L
Class: 0
Class: 0
Class: 0
Class: 0
Class: 0
Class: 0
Class: 0
Class: 0
Class: 0
Class: 0
Class: 0
Class: 0
Class: 0
Dog Dander: <0.1 kU/L
IgE (Immunoglobulin E), Serum: 35 kU/L (ref <=114)
Pecan/Hickory Tree IgE: <0.1 kU/L
Rough Pigweed  IgE: <0.1 kU/L
Sheep Sorrel IgE: <0.1 kU/L
Timothy Grass: <0.1 kU/L

## 2017-11-21 LAB — INTERPRETATION:

## 2017-11-23 ENCOUNTER — Encounter: Payer: Self-pay | Admitting: Internal Medicine

## 2017-11-23 NOTE — Assessment & Plan Note (Addendum)
  Body mass index is 46.64 kg/m.  -  trending up again  Lab Results  Component Value Date   TSH 2.748 09/05/2017     Contributing to gerd risk/ doe/reviewed the need and the process to achieve and maintain neg calorie balance > defer f/u primary care including intermittently monitoring thyroid status      I had an extended discussion with the patient reviewing all relevant studies completed to date and  lasting 25 minutes of a 40  minute office visit addressing    re  severe non-specific but potentially very serious refractory respiratory symptoms of uncertain and potentially multiple  etiologies.  See device teaching which extended face to face time for this visit    Each maintenance medication was reviewed in detail including most importantly the difference between maintenance and prns and under what circumstances the prns are to be triggered using an action plan format that is not reflected in the computer generated alphabetically organized AVS.    Please see AVS for specific instructions unique to this office visit that I personally wrote and verbalized to the the pt in detail and then reviewed with pt  by my nurse highlighting any changes in therapy/plan of care  recommended at today's visit.

## 2017-11-23 NOTE — Assessment & Plan Note (Addendum)
-  09/22/2014   Walked RA x one lap @ 185 stopped due to  Sob/ slow pace, no desat  - 11/20/2017   Walked RA x one lap @ 185 stopped due to  Sob slow pace, no desat    Symptoms are  disproportionate to objective findings and not clear to what extent this is actually a pulmonary  problem but pt does appear to have difficult to sort out respiratory symptoms of unknown origin for which  DDX  = almost all start with A and  include Adherence, Ace Inhibitors, Acid Reflux, Active Sinus Disease, Alpha 1 Antitripsin deficiency, Anxiety masquerading as Airways dz,  ABPA,  Allergy(esp in young), Aspiration (esp in elderly), Adverse effects of meds,  Active smoking or Vaping, A bunch of PE's/clot burden (a few small clots can't cause this syndrome unless there is already severe underlying pulm or vascular dz with poor reserve),  Anemia or thyroid disorder, plus two Bs  = Bronchiectasis and Beta blocker use..and one C= CHF    Adherence is always the initial "prime suspect" and is a multilayered concern that requires a "trust but verify" approach in every patient - starting with knowing how to use medications, especially inhalers, correctly, keeping up with refills and understanding the fundamental difference between maintenance and prns vs those medications only taken for a very short course and then stopped and not refilled.  - see device teaching - with all meds in hand using a trust but verify approach to confirm accurate Medication  Reconciliation The principal here is that until we are certain that the  patients are doing what we've asked, it makes no sense to ask them to do more.   ? Acid (or non-acid) GERD > always difficult to exclude as up to 75% of pts in some series report no assoc GI/ Heartburn symptoms> rec continue max (24h)  acid suppression and diet restrictions/ reviewed     ? Allergy / asthma > continue breo 100/ singulair for now but really not clear how much this is contributing to symptoms  ?  Anxiety/depression/ cognitive decline over poor baseline > usually at the bottom of this list of usual suspects but should be much higher on this pt's based on H and P and note already on cns active meds  and may interfere with adherence and also interpretation of response or lack thereof to symptom management which can be quite subjective.   Anemia/ thyroid dz >  Clearly still microcytic anemia with nl fe studies 10/24/17 with f/u by pcp planned   ? BB effects > very unlikely on bisoprolol  ? CHF > excluded with bnp so low

## 2017-11-23 NOTE — Assessment & Plan Note (Signed)
She is adequately Beta blocked by pulse rate but not ideally controlled at this point > no immediate changes needed - f/u with pcp planned

## 2017-11-23 NOTE — Assessment & Plan Note (Addendum)
Spirometry 07/05/10 >> FEV1 1.30 (64%), FEV1% 80  - Spirometry 02/06/2017  FEV1 0.88 (49%)  Ratio 63 with f/v loop non-physiologic but min curvature in effort indep portion   - 11/20/2017  After extensive coaching inhaler device,  effectiveness =    75% with baseline 0  Allergy profile 11/20/17  >  Eos 0.0/  IgE  35 RAST pos roach only   Have not been able to sort out how much of her problem is asthma at this point but ok to continue empirical rx for now with f/u with [facility] planned

## 2017-11-24 ENCOUNTER — Ambulatory Visit (INDEPENDENT_AMBULATORY_CARE_PROVIDER_SITE_OTHER): Payer: Medicare Other | Admitting: Pulmonary Disease

## 2017-11-24 ENCOUNTER — Encounter: Payer: Self-pay | Admitting: Pulmonary Disease

## 2017-11-24 VITALS — BP 148/102 | HR 66 | Ht 65.0 in | Wt 273.0 lb

## 2017-11-24 DIAGNOSIS — J449 Chronic obstructive pulmonary disease, unspecified: Secondary | ICD-10-CM | POA: Diagnosis not present

## 2017-11-24 DIAGNOSIS — G4733 Obstructive sleep apnea (adult) (pediatric): Secondary | ICD-10-CM | POA: Diagnosis not present

## 2017-11-24 DIAGNOSIS — J9611 Chronic respiratory failure with hypoxia: Secondary | ICD-10-CM | POA: Diagnosis not present

## 2017-11-24 MED ORDER — FLUTICASONE-UMECLIDIN-VILANT 100-62.5-25 MCG/INH IN AEPB: 1.0000 | INHALATION_SPRAY | RESPIRATORY_TRACT | 0 refills | Status: AC

## 2017-11-24 MED ORDER — FLUTICASONE-UMECLIDIN-VILANT 100-62.5-25 MCG/INH IN AEPB: 1.0000 | INHALATION_SPRAY | Freq: Every day | RESPIRATORY_TRACT | 5 refills | Status: DC

## 2017-11-24 NOTE — Patient Instructions (Signed)
Stop using breo  Start using trelegy one puff daily, and rinse your mouth after each use  Albuterol two puffs every 6 hours as needed for cough, wheeze, or chest congestion  Follow up in 2 months with Dr. Halford Chessman or Nurse Practitioner

## 2017-11-24 NOTE — Progress Notes (Signed)
[facility]  Chief Complaint  Patient presents with  . Follow-up    Pt uses cpap machine each night, is doing well with machine. During the day pt has increase SOB. Pt has had increase SOB, wheezing and chest tightness in last 8 days. Seen MW 11-20-17 had labs and CXR. Pt feels no better since MW ov.     Constitutional:  BP (!) 148/102 (BP Location: Left Arm, Cuff Size: Normal)   Pulse 66   Ht 5\' 5"  (1.651 m)   Wt 273 lb (123.8 kg)   SpO2 98%   BMI 45.43 kg/m   Past Medical History:  Colon polyp, CAD, Carpal tunnel, CKD 3, CVA, Depression, DM, Diverticulosis, Gastroparesis, GERD, Hiatal hernia, HLD, HTN, OA, Osteoporosis, neuropathy, seizures, Shingles, Sickle cell trait  Brief Summary:  Joann Gutierrez is a 72 y.o. 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  She gets confused easily about her medications. After showing her pictures of medications, she confirmed she is using breo daily, flonase daily, and singulair at night.  She uses albuterol daily also.  She gets cough with thick, white sputum.  Not having wheeze, fever, or chest pain.  Leg swelling has been stable.  Gets winded with activity, but she is not very active.  Uses CPAP and this helps.  She is not having issues with mask fit.  Denies sinus congestion, sore throat, dry mouth, or aerophagia.  Using oxygen at night with CPAP.  Physical Exam:   Appearance - well kempt   ENMT - clear nasal mucosa, midline nasal  septum, no oral exudates, no LAN, trachea midline, wears dentures  Respiratory - normal chest wall, normal respiratory effort, no accessory muscle use, no wheeze/rales  CV - s1s2 regular rate and rhythm, no murmurs, 1+ peripheral edema, radial pulses symmetric  GI - soft, non tender, no masses  Lymph - no adenopathy noted in neck and axillary areas  MSK - normal gait  Ext - no cyanosis, clubbing, or  joint inflammation noted  Skin - no rashes, lesions, or ulcers  Neuro - normal strength, oriented x 3  Psych - normal mood and affect   Assessment/Plan:   COPD with asthma. - change to trelegy in place of breo - continue singulair - prn ventolin  - continue breo, singulair, and prn ventolin  Upper airway cough syndrome with post nasal drip. - flonase, singulair  Chronic respiratory failure from COPD/asthma, and Rt hemidiaphragm elevation. - 2 liters oxygen at night with CPAP  Obstructive sleep apnea. - she reports benefit from CPAP - continue CPAP 9 cm H2O  Obesity with deconditioning. - discussed importance of weight loss and regular exercise regimen to maintain physical conditioning   Patient Instructions  Stop using breo  Start using trelegy one puff daily, and rinse your mouth after each use  Albuterol two puffs every 6 hours as needed for cough, wheeze, or chest congestion  Follow up in 2 months with Dr. Halford Chessman or Nurse Practitioner    [name], MD [place] Pager: [phone] 11/24/2017, 11:37 AM  Flow Sheet     Pulmonary tests:  Spirometry 07/05/10 >> FEV1 1.30 (64%), FEV1% 80 >> coughing during test RAST 12/11/12 >> negative, IgE 63.3 PFT 03/17/13 >> FEV1 1.27 (67%), FEV1% 84, TLC 4.06 (80%), DLCO 57%, borderline BD  Sleep tests:  PSG 04/28/09 >> AHI 11, SpO2 low 68% ONO with CPAP and room air 10/22/12 >>  Test time 10 hrs 59 min. Mean SpO2 92.4%, low SpO2 75%. Spent 16 min with SpO2 < 89%. CPAP 11/10/17 to 11/23/17 >> used on 12 of 14 nights with average 6 hrs 38 min.  Average AHI 7 with CPAP 9 cm H2O  Cardiac tests:  Lt, Rt Heart cath 03/03/13 >> RA 10, RV 38/7, PA 41/16 28, PCWP 13, LV 142/5, AO 137/76 Echo 09/08/17 >> EF 65 to 70%, grade 1 DD, PAS 39 mmHg  Medications:   Allergies as of 11/24/2017      Reactions   Morphine And Related Other (See Comments)   Family request not to be given, reports pt does not wake  up when given    Promethazine Hcl Other (See Comments)   REACTION: lethargy      Medication List        Accurate as of 11/24/17 11:37 AM. Always use your most recent med list.          albuterol 108 (90 Base) MCG/ACT inhaler Commonly known as:  PROVENTIL HFA;VENTOLIN HFA Inhale 2 puffs into the lungs every 6 (six) hours as needed for wheezing or shortness of breath.   aspirin EC 81 MG tablet Take 81 mg by mouth daily.   azelastine 0.1 % nasal spray Commonly known as:  ASTELIN 1 spray 2 (two) times daily. Use in each nostril as directed   bisoprolol 5 MG tablet Commonly known as:  ZEBETA Take 1 tablet (5 mg total) by mouth 2 (two) times daily.   calcium carbonate 1500 (600 Ca) MG Tabs tablet Commonly known as:  OSCAL Take 600 mg of elemental calcium by mouth daily with breakfast.   donepezil 10 MG tablet Commonly known as:  ARICEPT Take 10 mg by mouth at bedtime.   ezetimibe 10 MG tablet Commonly known as:  ZETIA Take 10 mg by mouth daily.   fluticasone 50 MCG/ACT nasal spray Commonly known as:  FLONASE USE TWO SPRAYS INTO BOTH NOSTRILS DAILY   Fluticasone-Umeclidin-Vilant 100-62.5-25 MCG/INH Aepb Inhale 1 puff into the lungs daily.   furosemide 80 MG tablet Commonly known as:  LASIX Take 80 mg by mouth 2 (two) times daily.   gabapentin 100 MG capsule Commonly known as:  NEURONTIN Take 1 capsule (100 mg total) by mouth 2 (two) times daily.   guaiFENesin 600 MG 12 hr tablet Commonly known as:  MUCINEX Take 2 tablets (1,200 mg total) by mouth 2 (two) times daily as needed for cough or to loosen phlegm.   ILEVRO 0.3 % ophthalmic suspension Generic drug:  nepafenac 1 drop daily.   insulin aspart 100 UNIT/ML injection Commonly known as:  novoLOG Inject 5 Units into the skin 3 (three) times daily with meals.   LEVEMIR FLEXTOUCH 100 UNIT/ML Pen Generic drug:  Insulin Detemir Inject 18 Units into the skin daily.   meclizine 25 MG tablet Commonly known as:   ANTIVERT Take 1 tablet (25 mg total) by mouth 3 (three) times daily as needed for dizziness.   montelukast 10 MG tablet Commonly known as:  SINGULAIR Take 1 tablet (10 mg total) by mouth daily.   nitroGLYCERIN 0.4 MG SL tablet Commonly known as:  NITROSTAT PLACE ONE TABLET UNDER THE TONGUE EVERY FIVE MINUTES AS NEEDED FOR CHEST PAIN   nystatin powder Generic drug:  nystatin Apply topically 4 (four) times daily. Apply to abdominal fold daily PRN   omeprazole 20 MG capsule Commonly known as:  PRILOSEC Take 20 mg by mouth 2 (two) times daily before  a meal.   OXYGEN Inhale 2 L into the lungs continuous. CPAP with oxygen at bedtime   raloxifene 60 MG tablet Commonly known as:  EVISTA Take 60 mg by mouth every morning.   TRUEPLUS PEN NEEDLES 32G X 4 MM Misc Generic drug:  Insulin Pen Needle INJECT THREE TIMES A DAY AS DIRECTED   Vitamin D (Ergocalciferol) 50000 units Caps capsule Commonly known as:  DRISDOL Take 50,000 Units by mouth every Monday.       Past Surgical History:  She  has a past surgical history that includes Abdominal hysterectomy; Replacement total knee (Left, 1998); Esophagogastroduodenoscopy (06/25/2006); ERD (08/08/2000); Artery Biopsy (01/07/2011); Breast lumpectomy; Breast lumpectomy; Hernia repair; Colonoscopy; Upper gastrointestinal endoscopy; bil foot surgery; left and right heart catheterization with coronary angiogram (N/A, 03/03/2013); Esophageal manometry (N/A, 03/13/2015); Esophagogastroduodenoscopy (egd) with propofol (N/A, 07/05/2015); Colonoscopy with propofol (N/A, 07/05/2015); and Cataract extraction (Left, 06/2016).  Family History:  Her family history includes Allergies in her sister; Cancer in her brother; Colon cancer in her brother; Diabetes in her mother, sister, and sister; Heart attack in her father; Heart disease in her mother, sister, and sister; Heart disease (age of onset: 59) in her father; Kidney disease in her mother, sister, and sister;  Liver cancer in her mother; Sickle cell anemia in her father.  Social History:  She  reports that she quit smoking about 37 years ago. She has a 5.00 pack-year smoking history. She has never used smokeless tobacco. She reports that she does not drink alcohol or use drugs.

## 2017-11-24 NOTE — Progress Notes (Signed)
Spoke with pt and notified of results per Dr. Wert. Pt verbalized understanding and denied any questions. 

## 2017-11-24 NOTE — Addendum Note (Signed)
Addended by: Georjean Mode on: 11/24/2017 11:44 AM   Modules accepted: Orders

## 2017-11-24 NOTE — Progress Notes (Signed)
Spoke with spouse and notified of results

## 2017-11-25 ENCOUNTER — Telehealth: Payer: Self-pay | Admitting: Cardiology

## 2017-11-25 ENCOUNTER — Telehealth (HOSPITAL_COMMUNITY): Payer: Self-pay

## 2017-11-25 DIAGNOSIS — I2510 Atherosclerotic heart disease of native coronary artery without angina pectoris: Secondary | ICD-10-CM | POA: Diagnosis not present

## 2017-11-25 DIAGNOSIS — I130 Hypertensive heart and chronic kidney disease with heart failure and stage 1 through stage 4 chronic kidney disease, or unspecified chronic kidney disease: Secondary | ICD-10-CM | POA: Diagnosis not present

## 2017-11-25 DIAGNOSIS — N183 Chronic kidney disease, stage 3 (moderate): Secondary | ICD-10-CM | POA: Diagnosis not present

## 2017-11-25 DIAGNOSIS — E1122 Type 2 diabetes mellitus with diabetic chronic kidney disease: Secondary | ICD-10-CM | POA: Diagnosis not present

## 2017-11-25 DIAGNOSIS — I5022 Chronic systolic (congestive) heart failure: Secondary | ICD-10-CM | POA: Diagnosis not present

## 2017-11-25 DIAGNOSIS — D631 Anemia in chronic kidney disease: Secondary | ICD-10-CM | POA: Diagnosis not present

## 2017-11-25 NOTE — Telephone Encounter (Signed)
Encounter complete. 

## 2017-11-25 NOTE — Telephone Encounter (Signed)
Received records from [place] [facility] on 11/25/17, Appt 02/20/18 @ 1:20PM.NV

## 2017-11-26 ENCOUNTER — Telehealth (HOSPITAL_COMMUNITY): Payer: Self-pay | Admitting: *Deleted

## 2017-11-26 NOTE — Telephone Encounter (Signed)
Patient given detailed instructions per Myocardial Perfusion Study Information Sheet for the test on 11/27/17 at 1300. Patient notified to arrive 15 minutes early and that it is imperative to arrive on time for appointment to keep from having the test rescheduled.  If you need to cancel or reschedule your appointment, please call the office within 24 hours of your appointment. . Patient verbalized understanding.Joann Gutierrez, [place]

## 2017-11-27 ENCOUNTER — Ambulatory Visit (HOSPITAL_COMMUNITY)
Admission: RE | Admit: 2017-11-27 | Discharge: 2017-11-27 | Disposition: A | Discharge status: Home / Self Care | Payer: Medicare Other | Source: Ambulatory Visit | Attending: Cardiology | Admitting: Cardiology

## 2017-11-27 DIAGNOSIS — R079 Chest pain, unspecified: Secondary | ICD-10-CM | POA: Insufficient documentation

## 2017-11-27 MED ORDER — TECHNETIUM TC 99M TETROFOSMIN IV KIT
31.4000 | PACK | Freq: Once | INTRAVENOUS | Status: AC | PRN
  Administered 2017-11-27: 31.4 via INTRAVENOUS
  Filled 2017-11-27: qty 32

## 2017-11-27 MED ORDER — ADENOSINE (DIAGNOSTIC) 3 MG/ML IV SOLN
0.5600 mg/kg | Freq: Once | INTRAVENOUS | Status: AC
  Administered 2017-11-27: 68.4 mg via INTRAVENOUS

## 2017-11-28 ENCOUNTER — Ambulatory Visit (HOSPITAL_COMMUNITY)
Admission: RE | Admit: 2017-11-28 | Discharge: 2017-11-28 | Disposition: A | Discharge status: Home / Self Care | Payer: Medicare Other | Source: Ambulatory Visit | Attending: Cardiology | Admitting: Cardiology

## 2017-11-28 DIAGNOSIS — N183 Chronic kidney disease, stage 3 (moderate): Secondary | ICD-10-CM | POA: Diagnosis not present

## 2017-11-28 DIAGNOSIS — I2510 Atherosclerotic heart disease of native coronary artery without angina pectoris: Secondary | ICD-10-CM | POA: Diagnosis not present

## 2017-11-28 DIAGNOSIS — D631 Anemia in chronic kidney disease: Secondary | ICD-10-CM | POA: Diagnosis not present

## 2017-11-28 DIAGNOSIS — I130 Hypertensive heart and chronic kidney disease with heart failure and stage 1 through stage 4 chronic kidney disease, or unspecified chronic kidney disease: Secondary | ICD-10-CM | POA: Diagnosis not present

## 2017-11-28 DIAGNOSIS — E1122 Type 2 diabetes mellitus with diabetic chronic kidney disease: Secondary | ICD-10-CM | POA: Diagnosis not present

## 2017-11-28 DIAGNOSIS — I5022 Chronic systolic (congestive) heart failure: Secondary | ICD-10-CM | POA: Diagnosis not present

## 2017-11-28 LAB — MYOCARDIAL PERFUSION IMAGING
CHL CUP NUCLEAR SDS: 1
CHL CUP NUCLEAR SRS: 3
Peak HR: 78 {beats}/min
Rest HR: 55 {beats}/min
SSS: 4
TID: 1.04

## 2017-11-28 MED ORDER — TECHNETIUM TC 99M TETROFOSMIN IV KIT
32.4000 | PACK | Freq: Once | INTRAVENOUS | Status: AC | PRN
  Administered 2017-11-28: 32.4 via INTRAVENOUS

## 2017-12-01 ENCOUNTER — Other Ambulatory Visit: Payer: Self-pay | Admitting: Pharmacist

## 2017-12-01 NOTE — Patient Outreach (Signed)
Joann Gutierrez) Care Management  Joann Gutierrez 12/01/2017  Joann Gutierrez Oct 30, 1945 [id number]  Reason for call: f/u on medication adherence  Incoming call from [facility] last week regarding losartan.  Representative stated patient list had losartan on active medication list but she will confirm with nephrologist. I updated her that losartan has not been called into pharmacy and a new prescription will be needed if this is to be started by patient.    Successful call to caregiver of Joann Gutierrez.  Caregiver, Joann Gutierrez, reports that patient doing well with medications currently.  He reports that he watches her take medications from pack and then also gives her medication from pillbottles.  There have been several changes to medications since patient started on compliance packs last month and patient will start new set of packs soon which will reflect all current medications.  Joann Gutierrez reports that patient is complaining of not remembering things often but is doing "ok."  We reviewed current furosemide dose and Joann Gutierrez confirms patient is taking 80mg  BID.  He is agreeable for home visit after new compliance packs start.   Home visit planned for December 3rd at 10:00AM.   Joann Gutierrez, PharmD, [place] [phone]

## 2017-12-02 ENCOUNTER — Ambulatory Visit (HOSPITAL_COMMUNITY)
Admission: RE | Admit: 2017-12-02 | Discharge: 2017-12-02 | Disposition: A | Discharge status: Home / Self Care | Payer: Medicare Other | Source: Ambulatory Visit | Attending: Nephrology | Admitting: Nephrology

## 2017-12-02 VITALS — BP 160/71 | HR 62 | Temp 98.3°F | Resp 20

## 2017-12-02 DIAGNOSIS — N183 Chronic kidney disease, stage 3 unspecified: Secondary | ICD-10-CM

## 2017-12-02 DIAGNOSIS — I2510 Atherosclerotic heart disease of native coronary artery without angina pectoris: Secondary | ICD-10-CM | POA: Diagnosis not present

## 2017-12-02 DIAGNOSIS — D631 Anemia in chronic kidney disease: Secondary | ICD-10-CM | POA: Diagnosis not present

## 2017-12-02 DIAGNOSIS — E1122 Type 2 diabetes mellitus with diabetic chronic kidney disease: Secondary | ICD-10-CM | POA: Diagnosis not present

## 2017-12-02 DIAGNOSIS — I5022 Chronic systolic (congestive) heart failure: Secondary | ICD-10-CM | POA: Diagnosis not present

## 2017-12-02 DIAGNOSIS — I130 Hypertensive heart and chronic kidney disease with heart failure and stage 1 through stage 4 chronic kidney disease, or unspecified chronic kidney disease: Secondary | ICD-10-CM | POA: Diagnosis not present

## 2017-12-02 LAB — IRON AND TIBC
Iron: 76 ug/dL (ref 28–170)
SATURATION RATIOS: 34 % — AB (ref 10.4–31.8)
TIBC: 227 ug/dL — AB (ref 250–450)
UIBC: 151 ug/dL

## 2017-12-02 LAB — RENAL FUNCTION PANEL
Albumin: 3 g/dL — ABNORMAL LOW (ref 3.5–5.0)
Anion gap: 7 (ref 5–15)
BUN: 30 mg/dL — ABNORMAL HIGH (ref 8–23)
CO2: 27 mmol/L (ref 22–32)
CREATININE: 1.94 mg/dL — AB (ref 0.44–1.00)
Calcium: 7.9 mg/dL — ABNORMAL LOW (ref 8.9–10.3)
Chloride: 111 mmol/L (ref 98–111)
GFR calc Af Amer: 29 mL/min — ABNORMAL LOW (ref >60)
GFR calc non Af Amer: 25 mL/min — ABNORMAL LOW (ref >60)
GLUCOSE: 112 mg/dL — AB (ref 70–99)
Phosphorus: 3.7 mg/dL (ref 2.5–4.6)
Potassium: 3.4 mmol/L — ABNORMAL LOW (ref 3.5–5.1)
SODIUM: 145 mmol/L (ref 135–145)

## 2017-12-02 LAB — POCT HEMOGLOBIN-HEMACUE: Hemoglobin: 10.2 g/dL — ABNORMAL LOW (ref 12.0–15.0)

## 2017-12-02 LAB — FERRITIN: FERRITIN: 436 ng/mL — AB (ref 11–307)

## 2017-12-02 MED ORDER — EPOETIN ALFA 20000 UNIT/ML IJ SOLN
INTRAMUSCULAR | Status: AC
  Administered 2017-12-02: 20000 [IU] via SUBCUTANEOUS
  Filled 2017-12-02: qty 1

## 2017-12-02 MED ORDER — EPOETIN ALFA 10000 UNIT/ML IJ SOLN
INTRAMUSCULAR | Status: AC
  Administered 2017-12-02: 10000 [IU] via SUBCUTANEOUS
  Filled 2017-12-02: qty 1

## 2017-12-02 MED ORDER — EPOETIN ALFA 40000 UNIT/ML IJ SOLN: 30000.0000 [IU] | INTRAMUSCULAR | Status: DC

## 2017-12-03 ENCOUNTER — Telehealth: Payer: Self-pay | Admitting: Cardiology

## 2017-12-03 DIAGNOSIS — I2510 Atherosclerotic heart disease of native coronary artery without angina pectoris: Secondary | ICD-10-CM | POA: Diagnosis not present

## 2017-12-03 DIAGNOSIS — D631 Anemia in chronic kidney disease: Secondary | ICD-10-CM | POA: Diagnosis not present

## 2017-12-03 DIAGNOSIS — I130 Hypertensive heart and chronic kidney disease with heart failure and stage 1 through stage 4 chronic kidney disease, or unspecified chronic kidney disease: Secondary | ICD-10-CM | POA: Diagnosis not present

## 2017-12-03 DIAGNOSIS — E1122 Type 2 diabetes mellitus with diabetic chronic kidney disease: Secondary | ICD-10-CM | POA: Diagnosis not present

## 2017-12-03 DIAGNOSIS — N183 Chronic kidney disease, stage 3 (moderate): Secondary | ICD-10-CM | POA: Diagnosis not present

## 2017-12-03 DIAGNOSIS — I5022 Chronic systolic (congestive) heart failure: Secondary | ICD-10-CM | POA: Diagnosis not present

## 2017-12-03 NOTE — Telephone Encounter (Signed)
The patient has been notified of the result and verbalized understanding.  All questions (if any) were answered. Raiford Simmonds, RN 12/03/2017 5:04 PM   PATIENT HAD QUESTION  WHETHER OR NOT TO  CONTINUE  CERTAIN MEDICATION , BUT PATIENT DID NOT HAVE NAME OR BOTTLE IN FRONT OF HER.   INFORMED PATIENT  TO CALL BACK  WHEN SHE HAS  INFORMATION BUT CONTINUE ALL CURRENT MEDICATIONS SHE VERBALIZED UNDERSTANDING

## 2017-12-03 NOTE — Telephone Encounter (Signed)
New Message           Patient returned your call and would like a call back

## 2017-12-04 DIAGNOSIS — I5022 Chronic systolic (congestive) heart failure: Secondary | ICD-10-CM | POA: Diagnosis not present

## 2017-12-04 DIAGNOSIS — I2510 Atherosclerotic heart disease of native coronary artery without angina pectoris: Secondary | ICD-10-CM | POA: Diagnosis not present

## 2017-12-04 DIAGNOSIS — I130 Hypertensive heart and chronic kidney disease with heart failure and stage 1 through stage 4 chronic kidney disease, or unspecified chronic kidney disease: Secondary | ICD-10-CM | POA: Diagnosis not present

## 2017-12-04 DIAGNOSIS — D631 Anemia in chronic kidney disease: Secondary | ICD-10-CM | POA: Diagnosis not present

## 2017-12-04 DIAGNOSIS — E1122 Type 2 diabetes mellitus with diabetic chronic kidney disease: Secondary | ICD-10-CM | POA: Diagnosis not present

## 2017-12-04 DIAGNOSIS — N183 Chronic kidney disease, stage 3 (moderate): Secondary | ICD-10-CM | POA: Diagnosis not present

## 2017-12-04 LAB — PTH, INTACT AND CALCIUM
Calcium, Total (PTH): 7.6 mg/dL — ABNORMAL LOW (ref 8.7–10.3)
PTH: 140 pg/mL — AB (ref 15–65)

## 2017-12-08 DIAGNOSIS — H35033 Hypertensive retinopathy, bilateral: Secondary | ICD-10-CM | POA: Diagnosis not present

## 2017-12-08 DIAGNOSIS — Z961 Presence of intraocular lens: Secondary | ICD-10-CM | POA: Diagnosis not present

## 2017-12-08 DIAGNOSIS — H2511 Age-related nuclear cataract, right eye: Secondary | ICD-10-CM | POA: Diagnosis not present

## 2017-12-08 DIAGNOSIS — E119 Type 2 diabetes mellitus without complications: Secondary | ICD-10-CM | POA: Diagnosis not present

## 2017-12-08 DIAGNOSIS — H25011 Cortical age-related cataract, right eye: Secondary | ICD-10-CM | POA: Diagnosis not present

## 2017-12-12 ENCOUNTER — Other Ambulatory Visit: Payer: Self-pay | Admitting: *Deleted

## 2017-12-12 NOTE — Patient Outreach (Signed)
[facility]) Care Management  12/12/2017  DENEAN PAVON 04/14/1945 [id number]   Call placed to Sharin Mons caregiver to follow up on current health status.  State member has been doing well, received pill packs that contain all meds.  Report member has not been weighing herself daily but he will have her start.  He purchased a notebook for recording, state he lost the calendar with logs that was provided by this care Freight forwarder.    He and member were out today shopping, member did not check her blood sugar today, he is unsure what blood sugar was yesterday.  State he was told that member was to stop taking Novolog completely however per [facility] pharmacist, she was only to stop the sliding scale, continuing the 5 units with each meal.  He still seem to be confused on dosing even after explanation of Levemir and Novolog dosing, but state he will work on accurate medication management.   Home visit scheduled with [facility] pharmacist within the next 2 weeks, also agree to home visit with this care manager.  Denies any urgent concerns, advised to contact with questions.  [name], [place], MSN [place] [phone]

## 2017-12-16 ENCOUNTER — Encounter (HOSPITAL_COMMUNITY): Payer: Medicare Other

## 2017-12-23 ENCOUNTER — Ambulatory Visit (HOSPITAL_COMMUNITY)
Admission: RE | Admit: 2017-12-23 | Discharge: 2017-12-23 | Disposition: A | Discharge status: Home / Self Care | Payer: Medicare Other | Source: Ambulatory Visit | Attending: Nephrology | Admitting: Nephrology

## 2017-12-23 ENCOUNTER — Other Ambulatory Visit: Payer: Self-pay | Admitting: *Deleted

## 2017-12-23 ENCOUNTER — Other Ambulatory Visit: Payer: Self-pay | Admitting: Pharmacist

## 2017-12-23 VITALS — BP 180/73 | HR 54 | Temp 97.7°F | Resp 20

## 2017-12-23 DIAGNOSIS — N183 Chronic kidney disease, stage 3 unspecified: Secondary | ICD-10-CM

## 2017-12-23 DIAGNOSIS — D631 Anemia in chronic kidney disease: Secondary | ICD-10-CM | POA: Diagnosis not present

## 2017-12-23 LAB — POCT HEMOGLOBIN-HEMACUE: Hemoglobin: 9.9 g/dL — ABNORMAL LOW (ref 12.0–15.0)

## 2017-12-23 MED ORDER — EPOETIN ALFA 20000 UNIT/ML IJ SOLN
INTRAMUSCULAR | Status: AC
  Administered 2017-12-23: 20000 [IU]
  Filled 2017-12-23: qty 1

## 2017-12-23 MED ORDER — EPOETIN ALFA 40000 UNIT/ML IJ SOLN: 30000.0000 [IU] | INTRAMUSCULAR | Status: DC

## 2017-12-23 MED ORDER — EPOETIN ALFA 10000 UNIT/ML IJ SOLN
INTRAMUSCULAR | Status: AC
  Administered 2017-12-23: 10000 [IU]
  Filled 2017-12-23: qty 1

## 2017-12-23 NOTE — Patient Outreach (Addendum)
[facility]) Care Management  [place]   12/23/2017  Joann Gutierrez Jul 22, 1945 [id number]   Home visit with patient and caregiver to f/u on compliance packaging  PMHx includes, but not limited to, former smoker, asthma, COPD, OSA, OHS, CKD-III, T2DM, postherpetic neuralgia, GERD, gastroparesis, esophageal dysmotility, morbid obesity, HTN, HLD, CAD, hx CVA, depression, osteoarthritis, poor health literacy.  Noted inhaler device technique ~25% per office notes Jan 2019.    Outreach:  Successful home visit this afternoon with Joann Gutierrez and caregiver, Joann Gutierrez.  -Patient reports she had a fall on Saturday morning in the bathroom.  She denies hitting her head. She reports that she is very sore in both legs and now has increased lower extremity swelling due to fall.   -Patient reports she stopped taking Novolog about a month ago and usually only takes Levemir 15 units daily rather than 18 units.  She has not been recording her CBGs.  She recalls the highest CBG recently was 191, and the lowest was 98.   -She reports she is very happy with the compliance packs and that it is much easier for her to take her oral medications.    Objective: Lab Results  Component Value Date   CREATININE 1.94 (H) 12/02/2017   CREATININE 1.92 (H) 11/20/2017   CREATININE 1.98 (H) 10/24/2017    Lab Results  Component Value Date   HGBA1C 11.5 (H) 09/06/2017    Lipid Panel     Component Value Date/Time   CHOL 238 (H) 09/06/2017 0308   TRIG 174 (H) 09/06/2017 0308   HDL 37 (L) 09/06/2017 0308   CHOLHDL 6.4 09/06/2017 0308   VLDL 35 09/06/2017 0308   LDLCALC 166 (H) 09/06/2017 0308   LDLDIRECT 190.0 01/02/2009 1101    BP Readings from Last 3 Encounters:  12/23/17 (!) 180/73  12/02/17 (!) 160/71  11/24/17 (!) 148/102   Blood pressure using patient's home blood pressure cuff:   199/86 177/65  Allergies  Allergen Reactions  . Morphine And Related Other (See Comments)     Family request not to be given, reports pt does not wake up when given   . Promethazine Hcl Other (See Comments)    REACTION: lethargy    Medications Reviewed Today    Reviewed by Domingo Madeira, RN (Registered Nurse) on 12/02/17 at [place] List Status: <None>  Medication Order Taking? Sig Documenting Provider Last Dose Status Informant  albuterol (PROVENTIL HFA;VENTOLIN HFA) 108 (90 Base) MCG/ACT inhaler [id number]  Inhale 2 puffs into the lungs every 6 (six) hours as needed for wheezing or shortness of breath. Lauraine Rinne, NP  Active   aspirin EC 81 MG tablet [id number]  Take 81 mg by mouth daily. [provider]  Active Self  azelastine (ASTELIN) 0.1 % nasal spray [id number]  1 spray 2 (two) times daily. Use in each nostril as directed [provider]  Active   bisoprolol (ZEBETA) 5 MG tablet [id number]  Take 1 tablet (5 mg total) by mouth 2 (two) times daily. Barrett, Evelene Croon, PA-C  Active   calcium carbonate (OSCAL) 1500 (600 Ca) MG TABS tablet [id number]  Take 600 mg of elemental calcium by mouth daily with breakfast. [provider]  Active Self  donepezil (ARICEPT) 10 MG tablet [id number]  Take 10 mg by mouth at bedtime. [provider]  Active   ezetimibe (ZETIA) 10 MG tablet [id number]  Take 10 mg by mouth daily.  [provider]  Active  Self  fluticasone (FLONASE) 50 MCG/ACT nasal spray [id number]  USE TWO SPRAYS INTO BOTH NOSTRILS DAILY [name], MD  Active Self  Fluticasone-Umeclidin-Vilant (TRELEGY ELLIPTA) 100-62.5-25 MCG/INH AEPB [id number]  Inhale 1 puff into the lungs daily. [name], MD  Active   furosemide (LASIX) 80 MG tablet [id number]  Take 80 mg by mouth 2 (two) times daily. [provider]  Active   gabapentin (NEURONTIN) 100 MG capsule [id number]  Take 1 capsule (100 mg total) by mouth 2 (two) times daily. Modena Jansky, MD  Active            Med Note Harrel Lemon, [place] T   Fri Nov 14, 2017  1:16 PM)     guaiFENesin (MUCINEX) 600 MG 12 hr tablet [id number]  Take 2 tablets (1,200 mg total) by mouth 2 (two) times daily as needed for cough or to loosen phlegm. [name], MD  Active Self  insulin aspart (NOVOLOG) 100 UNIT/ML injection [id number]  Inject 5 Units into the skin 3 (three) times daily with meals. Modena Jansky, MD  Active   LEVEMIR FLEXTOUCH 100 UNIT/ML Pen [id number]  Inject 18 Units into the skin daily. Modena Jansky, MD  Active            Med Note Iva Lento, Louann Liv Nov 17, 2017  1:42 PM)    meclizine (ANTIVERT) 25 MG tablet [id number]  Take 1 tablet (25 mg total) by mouth 3 (three) times daily as needed for dizziness. Julianne Rice, MD  Active Self  montelukast (SINGULAIR) 10 MG tablet [id number]  Take 1 tablet (10 mg total) by mouth daily. Lauraine Rinne, NP  Active   nepafenac (ILEVRO) 0.3 % ophthalmic suspension [id number]  1 drop daily. [provider]  Active   nitroGLYCERIN (NITROSTAT) 0.4 MG SL tablet [id number]  PLACE ONE TABLET UNDER THE TONGUE EVERY FIVE MINUTES AS NEEDED FOR CHEST PAIN Minus Breeding, MD  Active Self           Med Note (CAULFIELD, ASHLEY L   Mon Oct 06, 2017  9:28 AM)    nystatin (NYSTATIN) powder [id number]  Apply topically 4 (four) times daily. Apply to abdominal fold daily PRN [provider]  Active   omeprazole (PRILOSEC) 20 MG capsule [id number]  Take 20 mg by mouth 2 (two) times daily before a meal. [provider]  Active   OXYGEN [id number]  Inhale 2 L into the lungs continuous. CPAP with oxygen at bedtime  [provider]  Active Self  raloxifene (EVISTA) 60 MG tablet [id number]  Take 60 mg by mouth every morning.  [provider]  Active Self  TRUEPLUS PEN NEEDLES 32G X 4 MM MISC [id number]  INJECT THREE TIMES A DAY AS DIRECTED [provider]  Active Self           Med Note Kary Kos, JULIE E   Fri Jul 11, 2017  2:21 PM) With Levemir  Vitamin D, Ergocalciferol, (DRISDOL) 50000 UNITS CAPS  capsule [id number]  Take 50,000 Units by mouth every Monday.  [provider]  Active Self          Assessment:  Drugs sorted by system:  Neurologic/Psychologic: donepezil, gabapentin, meclizine  Cardiovascular: aspirin 81mg , bisoprolol, ezetimibe, furosemide, losartan, SL NTG  Pulmonary/Allergy: albuterol INH, Trelegy Ellipta INH, azelastine NS, guaifenesin, montelukast  Gastrointestinal:omeprazole  Endocrine: Novolog, Levemir, raloxifene  Topical:nepafenac eye drops, nystatin powder  Vitamins/Minerals/Supplements: calcium carbonate, vitamin D  Medication Review Findings:  . Statin held  after hospitalization in 8/19 due to complaints of soft tissue pain for trial off to monitor symptoms.  Has not been restarted.   o Will route note to cardiology for recommendations.   . Stopped taking Novolog recently, likely due to confusion with stopping sliding scale directions.  Taking lower dose of Levemir than prescribed.   o Called Dr. Lina Sar office to confirm current prescribed dose of Novolog 5 units TID with meals and Levemir 18 units daily.  Counseled both patient and caregiver on correct use of insulin.  Written instructions left for patient. Caregiver voiced understanding and will assist patient with insulin.    Running low on Trelegy inhaler.  5 doses remaining.    Refill called into [facility].  Medication will be delivered tomorrow afternoon.    Medication adherence much improved with use of compliance packs per visual inspection of packs.  No missed doses from current packs.  Patient did miss several doses of furosemide in November when she was prescribed extra doses for swelling.  States she stopped these due to incontinence.   Congratulated patient on improved adherence and encouraged her to continue to take medication from packs along with OTC bottles (aspirin, calcium), inhalers, insulin, NS, and eye drops.   Other issues:  . Fall in house on Saturday,  patient states she slipped.   o Called Dr. Lina Sar office with update and symptoms (pain, swelling bilateral lower extremities).  Office visit scheduled for Thursday, Dec 5th at 10:45AM.  [facility] RN will accompany patient to visit.     Hypertensive urgency with elevated blood pressures (180/73 previously today when receiving epoetin alfa injection and today during home visit (199/86).  Second blood pressure reading taken which was lower but still elevated.  Message left with Dr. Lina Sar office. Patient already has f/u visit scheduled for Thursday.  Patient will bring in blood pressure machine to office to verify that it is working accurately.  If not, [facility] may provide patient with new machine.   Plan: F/u with patient in 2 weeks regarding insulin adherence and possible need for BP machine.  Route note to cardiologist regarding statin.  Route note to Wyn Quaker, NP with pulmonology for update on patient.   Ralene Bathe, PharmD, [place] [phone]

## 2017-12-23 NOTE — Patient Outreach (Signed)
[place] [facility]) Care Management  12/23/2017  Joann Gutierrez Jun 09, 1945 [id number]   Call received from [facility] pharmacist, C. Summe, regarding concern for member's elevated blood pressure.  She is currently in the home with the member, blood pressure initially 199/86, recheck was 177/65.  She also report member had fall over the weekend, scheduled appointment with primary MD for 12/5.  This care manager was scheduled for home visit at the same time, will accompany to MD office instead.   [name], [place], MSN [place] [phone]

## 2017-12-24 NOTE — Progress Notes (Signed)
Thanks for the update. Sorry to hear about the fall.   Joann Gutierrez

## 2017-12-25 ENCOUNTER — Other Ambulatory Visit: Payer: Self-pay | Admitting: Internal Medicine

## 2017-12-25 ENCOUNTER — Ambulatory Visit
Admission: RE | Admit: 2017-12-25 | Discharge: 2017-12-25 | Disposition: A | Discharge status: Home / Self Care | Payer: Medicare Other | Source: Ambulatory Visit | Attending: Internal Medicine | Admitting: Internal Medicine

## 2017-12-25 ENCOUNTER — Other Ambulatory Visit: Payer: Self-pay

## 2017-12-25 DIAGNOSIS — I10 Essential (primary) hypertension: Secondary | ICD-10-CM | POA: Diagnosis not present

## 2017-12-25 DIAGNOSIS — F0390 Unspecified dementia without behavioral disturbance: Secondary | ICD-10-CM | POA: Diagnosis not present

## 2017-12-25 DIAGNOSIS — M25569 Pain in unspecified knee: Secondary | ICD-10-CM | POA: Diagnosis not present

## 2017-12-25 DIAGNOSIS — M25561 Pain in right knee: Secondary | ICD-10-CM

## 2017-12-25 NOTE — Patient Outreach (Signed)
[facility]) Care Management  12/25/2017  Joann Gutierrez 04-12-1945 [id number]   Met with member and member's live-in caregiver "Joann Gutierrez" post MD appointment. Plan was to accompany member to PCP appointment; however, member stated that she arrived early and was seen by PCP at 29 instead of 1045. PCP appointment scheduled due to member's c/o R knee pain s/p fall at home. Accompanied member to [facility] department for knee XRAY and member continues to c/o R knee pain.   Member alert and oriented x4. Appearance neat and clean. Member's current weight 276 lbs which is 9 lb weight loss as planned with member's medical team. Member states she left medications at home due to preoccupation with need to be outside for transportation as member states she must be outside within 5 minutes of arrival of transportation. Joann Gutierrez states that PCP requested return call today with a  list of member's current medications taken. PCP will evaluate medications and will make adjustments to medications as needed in order to effectively manage member's BP. BP today 150/100 at PCP office; however, member denies taking BP medication this morning  Member denies weighing self daily as educated and encouraged to do so via previous Care Coordinator. Spoke to member today concerning additional support via [employer] and member verbalized interest; however, member does not have Medicaid and voiced concern about the co-pay. Member refuses at this time due to co-pay concerns. Will discuss at later time.  Will collaborate with [facility] Pharmacist to follow up with potential medication changes. Will follow up with member and caregiver within next two weeks.  Joann Gutierrez "Joann Gutierrez" Joann Lobo, RN-BSN  [facility] Care Management  Community Care Management Coordinator  [phone] [email or web address]

## 2017-12-30 ENCOUNTER — Encounter (HOSPITAL_COMMUNITY): Payer: Medicare Other

## 2018-01-01 IMAGING — US US ABDOMEN COMPLETE
1 series · 14 of 25 positions shown · non-contrast
Comparison: 09/15/2013.

CLINICAL DATA: Abdominal pain.

EXAM:
ABDOMEN ULTRASOUND COMPLETE

[Series 1: us abdomen complete · 0.22mm/px · 14 of 100 slices shown]
[im 1/100]
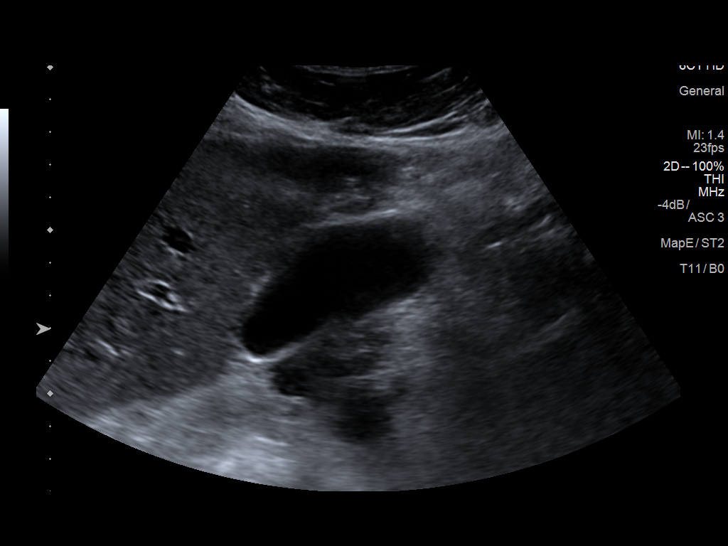
[im 9/100]
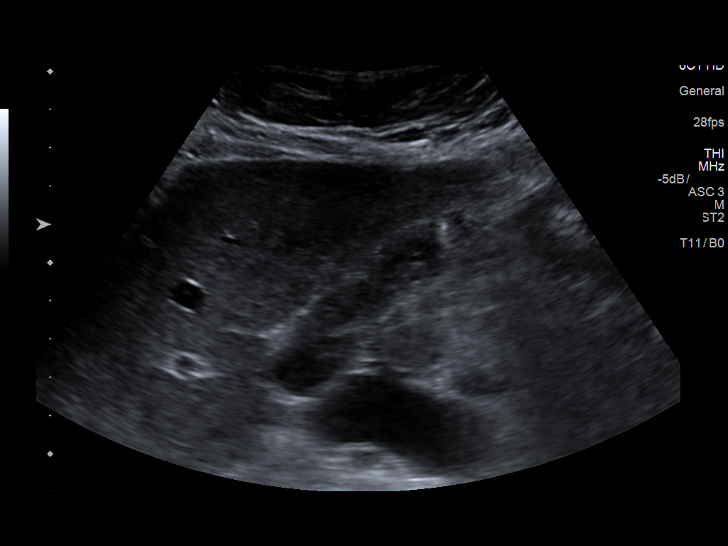
[im 17/100]
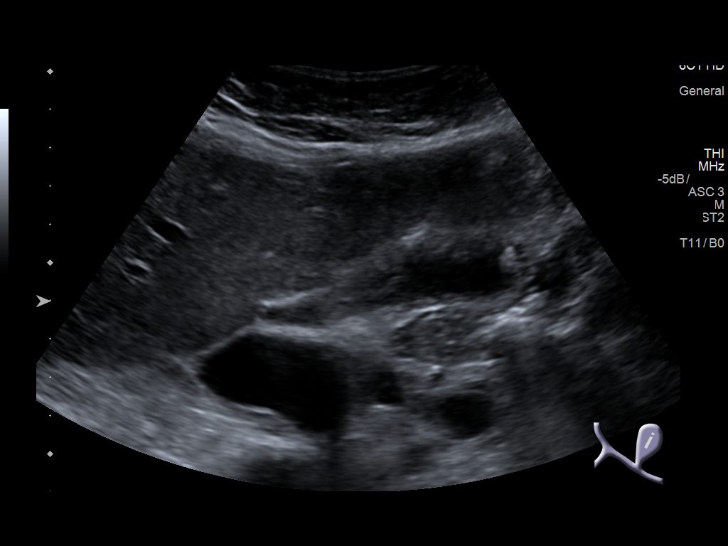
[im 25/100]
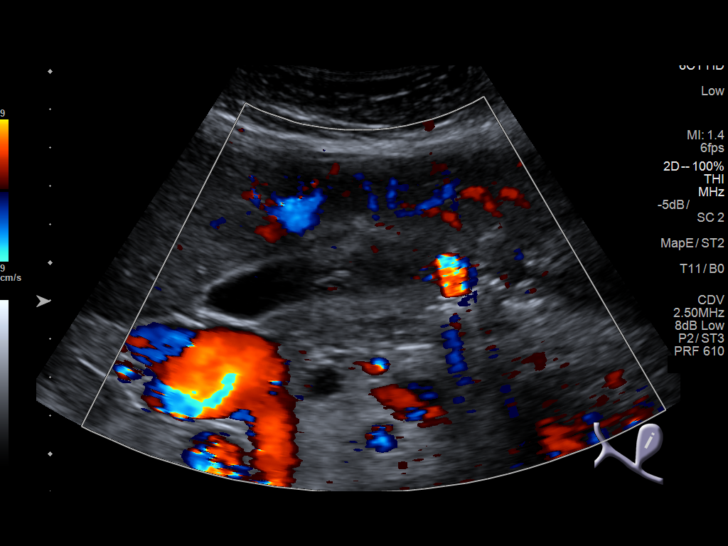
[im 34/100]
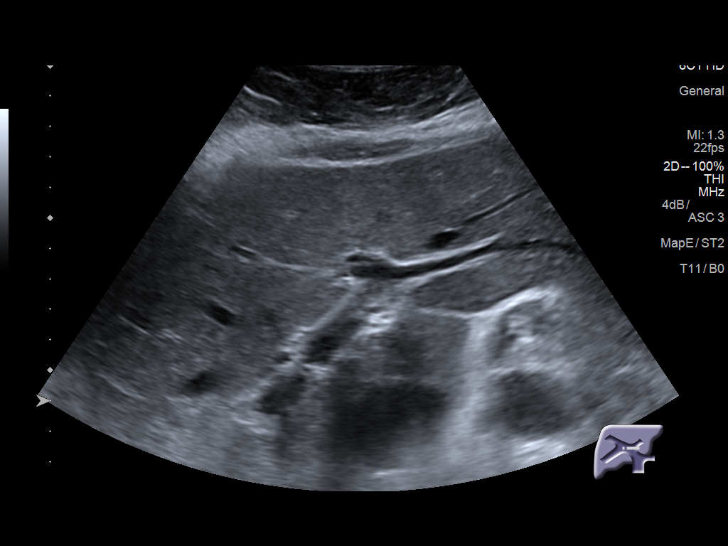
[im 38/100]
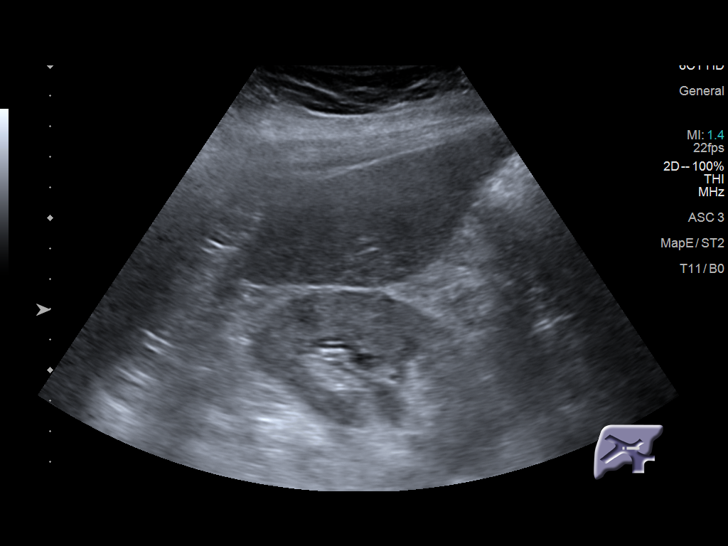
[im 46/100]
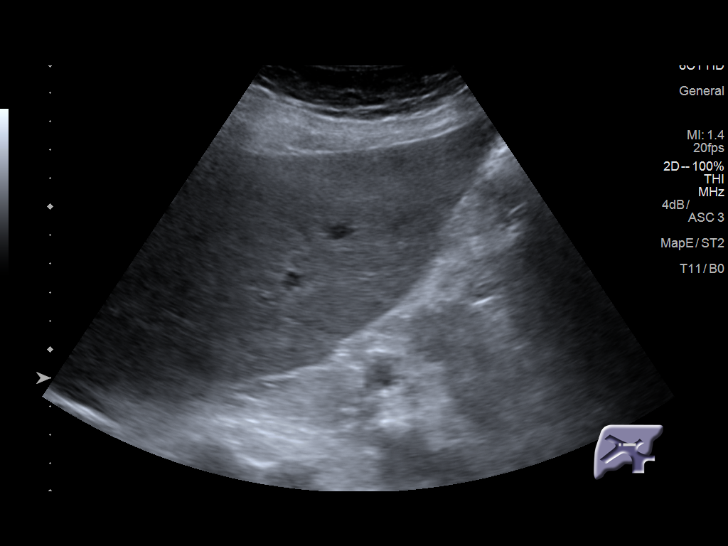
[im 54/100]
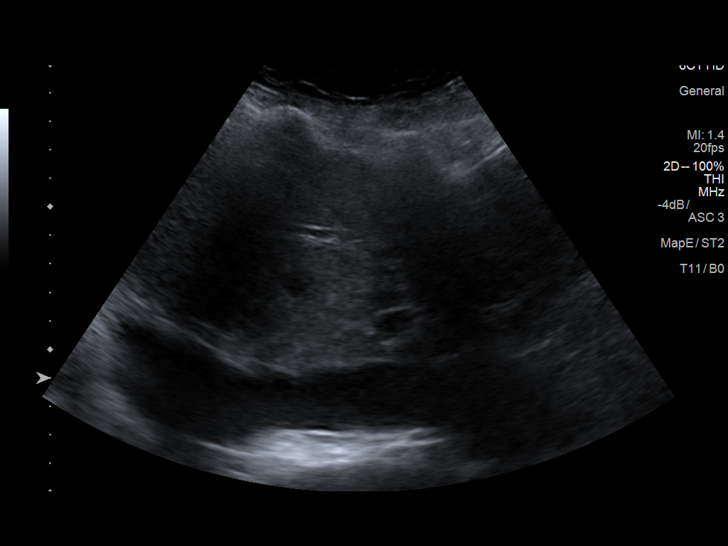
[im 62/100]
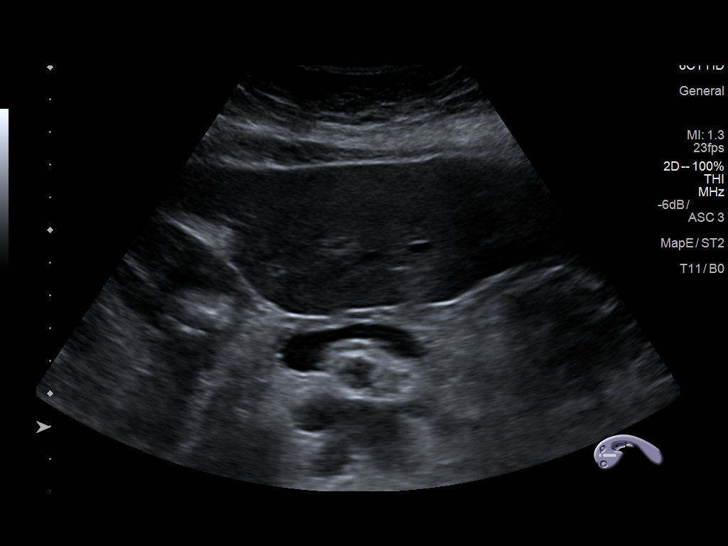
[im 67/100]
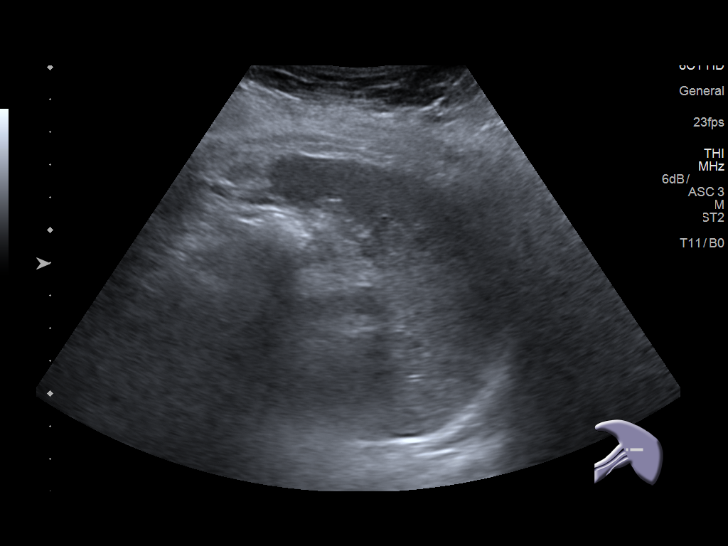
[im 75/100]
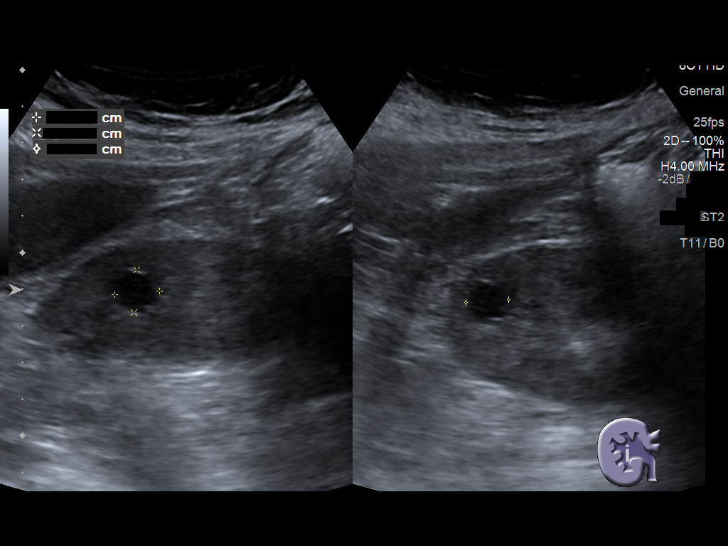
[im 83/100]
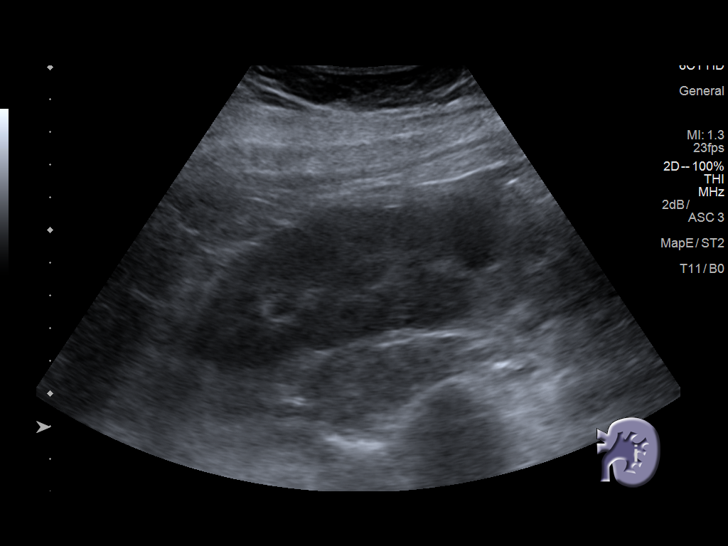
[im 91/100]
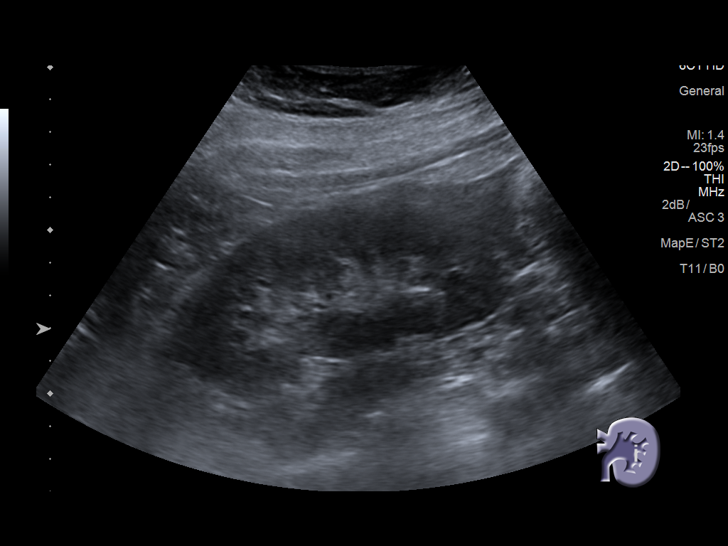
[im 100/100]
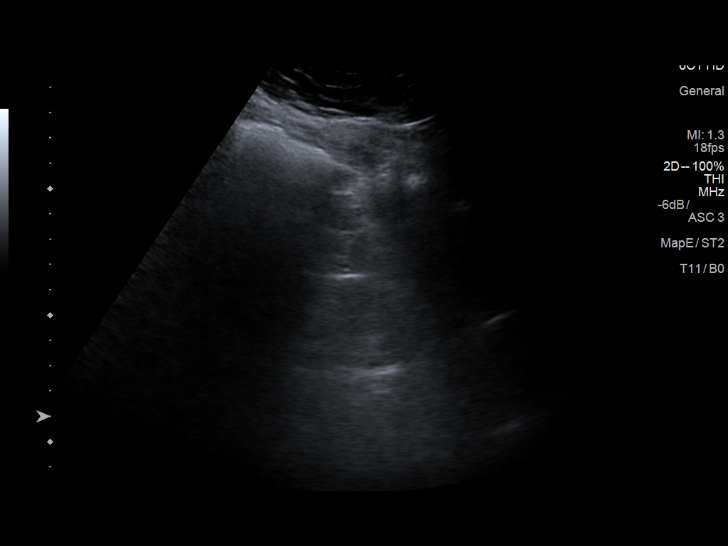

[14 of 25 positions shown; findings below may reference images not displayed]

FINDINGS: Gallbladder: No gallstones noted. 6 mm echodensity noted the
anterior gallbladder wall. This non mobile. This consistent polyp.
Gallbladder wall thickness 2.1 mm. Negative Murphy sign.

Common bile duct: Diameter: 4.6 mm

Liver: No focal lesion identified. Within normal limits in
parenchymal echogenicity.

IVC: No abnormality visualized.

Pancreas: Visualized portion unremarkable.

Spleen: Size and appearance within normal limits.

Right Kidney: Length: 10.2 cm. Echogenicity within normal limits. No
solid mass or hydronephrosis visualized. 1.2 cm simple cyst.

Left Kidney: Length: 10.6 cm. Echogenicity within normal limits. No
solid mass or hydronephrosis visualized. 1.5 cm and 3.2 cm simple
cyst.

Abdominal aorta: No aneurysm visualized.

Other findings: None.
IMPRESSION: 1. 6 mm gallbladder polyp.

2.  Simple cysts both kidneys.

## 2018-01-06 ENCOUNTER — Ambulatory Visit (HOSPITAL_COMMUNITY)
Admission: RE | Admit: 2018-01-06 | Discharge: 2018-01-06 | Disposition: A | Discharge status: Home / Self Care | Payer: Medicare Other | Source: Ambulatory Visit | Attending: Nephrology | Admitting: Nephrology

## 2018-01-06 ENCOUNTER — Other Ambulatory Visit: Payer: Self-pay | Admitting: Pharmacist

## 2018-01-06 ENCOUNTER — Ambulatory Visit: Payer: Self-pay | Admitting: Pharmacist

## 2018-01-06 VITALS — BP 174/86 | HR 50 | Temp 98.3°F | Resp 20

## 2018-01-06 DIAGNOSIS — N183 Chronic kidney disease, stage 3 unspecified: Secondary | ICD-10-CM

## 2018-01-06 DIAGNOSIS — E1122 Type 2 diabetes mellitus with diabetic chronic kidney disease: Secondary | ICD-10-CM | POA: Diagnosis not present

## 2018-01-06 DIAGNOSIS — N184 Chronic kidney disease, stage 4 (severe): Secondary | ICD-10-CM | POA: Diagnosis not present

## 2018-01-06 DIAGNOSIS — I129 Hypertensive chronic kidney disease with stage 1 through stage 4 chronic kidney disease, or unspecified chronic kidney disease: Secondary | ICD-10-CM | POA: Diagnosis not present

## 2018-01-06 DIAGNOSIS — M9080 Osteopathy in diseases classified elsewhere, unspecified site: Secondary | ICD-10-CM | POA: Diagnosis not present

## 2018-01-06 DIAGNOSIS — D631 Anemia in chronic kidney disease: Secondary | ICD-10-CM | POA: Diagnosis not present

## 2018-01-06 DIAGNOSIS — E889 Metabolic disorder, unspecified: Secondary | ICD-10-CM | POA: Diagnosis not present

## 2018-01-06 LAB — RENAL FUNCTION PANEL
Albumin: 2.9 g/dL — ABNORMAL LOW (ref 3.5–5.0)
Anion gap: 10 (ref 5–15)
BUN: 43 mg/dL — ABNORMAL HIGH (ref 8–23)
CO2: 28 mmol/L (ref 22–32)
Calcium: 8.5 mg/dL — ABNORMAL LOW (ref 8.9–10.3)
Chloride: 107 mmol/L (ref 98–111)
Creatinine, Ser: 2.46 mg/dL — ABNORMAL HIGH (ref 0.44–1.00)
GFR calc Af Amer: 22 mL/min — ABNORMAL LOW (ref >60)
GFR calc non Af Amer: 19 mL/min — ABNORMAL LOW (ref >60)
Glucose, Bld: 134 mg/dL — ABNORMAL HIGH (ref 70–99)
Phosphorus: 3.1 mg/dL (ref 2.5–4.6)
Potassium: 3.1 mmol/L — ABNORMAL LOW (ref 3.5–5.1)
Sodium: 145 mmol/L (ref 135–145)

## 2018-01-06 LAB — FERRITIN: FERRITIN: 497 ng/mL — AB (ref 11–307)

## 2018-01-06 LAB — POCT HEMOGLOBIN-HEMACUE: Hemoglobin: 10.1 g/dL — ABNORMAL LOW (ref 12.0–15.0)

## 2018-01-06 LAB — IRON AND TIBC
Iron: 68 ug/dL (ref 28–170)
SATURATION RATIOS: 31 % (ref 10.4–31.8)
TIBC: 223 ug/dL — ABNORMAL LOW (ref 250–450)
UIBC: 155 ug/dL

## 2018-01-06 MED ORDER — EPOETIN ALFA 40000 UNIT/ML IJ SOLN: 30000.0000 [IU] | INTRAMUSCULAR | Status: DC

## 2018-01-06 MED ORDER — EPOETIN ALFA 10000 UNIT/ML IJ SOLN
INTRAMUSCULAR | Status: AC
  Administered 2018-01-06: 10000 [IU] via SUBCUTANEOUS
  Filled 2018-01-06: qty 1

## 2018-01-06 MED ORDER — EPOETIN ALFA 20000 UNIT/ML IJ SOLN
INTRAMUSCULAR | Status: AC
  Administered 2018-01-06: 20000 [IU] via SUBCUTANEOUS
  Filled 2018-01-06: qty 1

## 2018-01-06 NOTE — Patient Outreach (Signed)
[facility]) Care Management  [place] 01/06/2018  Joann Gutierrez September 14, 1945 [id number]  Reason for call: f/u on BP and insulin instructions  Successful call to Joann Gutierrez's caregiver, Joann Gutierrez. Joann Gutierrez reports that patient's blood pressure has been "better" in the last week but states SBP is still 160s.  He states he is waiting with patient outside right now for a ride to see nephrologist and that patient has visit with PCP on Thursday.  Compliance packs scheduled to be delivered tomorrow.  He states patient has enough medication for several more days.  Agreeable to defer compliance packs until Friday in case there are medication changes at either appt this week.   Joann Gutierrez states patient has been taking insulin as directed.  Denies other concerns or questions right now.   Care coordination call to [facility].  Pharmacy will hold off on packing medications until Friday and will call patient to schedule delivery.     Plan: F/u with patient next week regarding medications  Ralene Bathe, PharmD, [place] [phone]

## 2018-01-07 ENCOUNTER — Ambulatory Visit: Payer: Self-pay | Admitting: Pharmacist

## 2018-01-07 ENCOUNTER — Ambulatory Visit: Payer: Self-pay | Admitting: *Deleted

## 2018-01-07 DIAGNOSIS — N183 Chronic kidney disease, stage 3 (moderate): Secondary | ICD-10-CM | POA: Diagnosis not present

## 2018-01-07 DIAGNOSIS — J45909 Unspecified asthma, uncomplicated: Secondary | ICD-10-CM | POA: Diagnosis not present

## 2018-01-07 DIAGNOSIS — M1990 Unspecified osteoarthritis, unspecified site: Secondary | ICD-10-CM | POA: Diagnosis not present

## 2018-01-07 DIAGNOSIS — D638 Anemia in other chronic diseases classified elsewhere: Secondary | ICD-10-CM | POA: Diagnosis not present

## 2018-01-07 DIAGNOSIS — E1165 Type 2 diabetes mellitus with hyperglycemia: Secondary | ICD-10-CM | POA: Diagnosis not present

## 2018-01-07 DIAGNOSIS — I10 Essential (primary) hypertension: Secondary | ICD-10-CM | POA: Diagnosis not present

## 2018-01-07 DIAGNOSIS — F0390 Unspecified dementia without behavioral disturbance: Secondary | ICD-10-CM | POA: Diagnosis not present

## 2018-01-07 DIAGNOSIS — I2510 Atherosclerotic heart disease of native coronary artery without angina pectoris: Secondary | ICD-10-CM | POA: Diagnosis not present

## 2018-01-07 DIAGNOSIS — E1121 Type 2 diabetes mellitus with diabetic nephropathy: Secondary | ICD-10-CM | POA: Diagnosis not present

## 2018-01-07 LAB — PTH, INTACT AND CALCIUM
CALCIUM TOTAL (PTH): 8.5 mg/dL — AB (ref 8.7–10.3)
PTH: 71 pg/mL — ABNORMAL HIGH (ref 15–65)

## 2018-01-08 ENCOUNTER — Other Ambulatory Visit: Payer: Self-pay | Admitting: *Deleted

## 2018-01-08 NOTE — Patient Outreach (Addendum)
[facility]) Care Management  01/08/2018  Joann Gutierrez [birth year] [id number]   Call placed to member's friend/caregiver, [name] to follow up on current status and medication changes.  He report they have had follow up with MD, blood pressure medication was changed.  [facility] pharmacist is aware and new compliance packaging will be delivered by Monday.  They have continued to check member's blood pressure, blood sugar, and weights daily, unable to state specific numbers and they are not home and member has not been consistent with document despite being advised to do so multiple times.  He report her readings have improved.  Report compliance with medications, including insulin.  Discussed healthy eating over the holiday in effort to decrease risk of admission, verbalizes understanding.  Denies any urgent concerns at this time, will follow up within the next month.  [name], [place], MSN [place] [phone]
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
## 2018-01-20 ENCOUNTER — Ambulatory Visit (HOSPITAL_COMMUNITY)
Admission: RE | Admit: 2018-01-20 | Discharge: 2018-01-20 | Disposition: A | Discharge status: Home / Self Care | Payer: Medicare Other | Source: Ambulatory Visit | Attending: Nephrology | Admitting: Nephrology

## 2018-01-20 VITALS — BP 134/98 | HR 54 | Temp 97.9°F | Resp 20

## 2018-01-20 DIAGNOSIS — N183 Chronic kidney disease, stage 3 unspecified: Secondary | ICD-10-CM

## 2018-01-20 LAB — POCT HEMOGLOBIN-HEMACUE: Hemoglobin: 10 g/dL — ABNORMAL LOW (ref 12.0–15.0)

## 2018-01-20 MED ORDER — EPOETIN ALFA 10000 UNIT/ML IJ SOLN
INTRAMUSCULAR | Status: AC
  Administered 2018-01-20: 10000 [IU] via SUBCUTANEOUS
  Filled 2018-01-20: qty 1

## 2018-01-20 MED ORDER — EPOETIN ALFA 20000 UNIT/ML IJ SOLN
INTRAMUSCULAR | Status: AC
  Administered 2018-01-20: 20000 [IU] via SUBCUTANEOUS
  Filled 2018-01-20: qty 1

## 2018-01-20 MED ORDER — EPOETIN ALFA 40000 UNIT/ML IJ SOLN: 30000.0000 [IU] | INTRAMUSCULAR | Status: DC

## 2018-01-23 ENCOUNTER — Other Ambulatory Visit: Payer: Self-pay | Admitting: Pharmacist

## 2018-01-23 ENCOUNTER — Ambulatory Visit: Payer: Self-pay | Admitting: Pharmacist

## 2018-01-23 ENCOUNTER — Encounter: Payer: Self-pay | Admitting: *Deleted

## 2018-01-23 NOTE — Telephone Encounter (Signed)
This encounter was created in error - please disregard.

## 2018-01-23 NOTE — Patient Outreach (Signed)
[facility]) Care Management  [place] 01/23/2018  WAYNESHA RAMMEL 1945-02-21 [id number]  Reason for call: medication adherence  Successful call today to Ms[employer] caregiver, [name].    [name] reports that patient continues to use compliance packs successfully.  Patient is  happy with [facility] delivery services.  He reports recent increase in losartan dose due to continues elevated blood pressure and that pharmacy will deliver additional medication later today in a pillbottle.  Once current month of compliance packs finishes, updated losartan dose will be corrected in next set of packs.    [name] states that patient still refused to check CBGs regularly and does not like to weigh herself.  [name] is helping with checking blood pressure daily and reports it has been improving. He thinks that patient's BP was 148/82 earlier today.  [name] denies any further medication questions or issues at this time.  I provided my phone number again to him should he need to reach me for any concerns.    Care coordination placed to [facility] RN to discuss patient.  RN will outreach patient again later this month to review goals and progress through care plan.    Plan: Will close [place] case at this time as no further medication concerns currently.  Am happy to assist again in the future if needed.  Thank you for allowing [facility] pharmacy to be involved in this patient's care.    Ralene Bathe, PharmD, [place] [phone]

## 2018-01-26 ENCOUNTER — Ambulatory Visit: Payer: Medicare Other | Admitting: Pulmonary Disease

## 2018-02-03 ENCOUNTER — Ambulatory Visit (HOSPITAL_COMMUNITY)
Admission: RE | Admit: 2018-02-03 | Discharge: 2018-02-03 | Disposition: A | Discharge status: Home / Self Care | Payer: Medicare Other | Source: Ambulatory Visit | Attending: Nephrology | Admitting: Nephrology

## 2018-02-03 VITALS — BP 176/88 | HR 58 | Temp 97.7°F | Resp 18

## 2018-02-03 DIAGNOSIS — D631 Anemia in chronic kidney disease: Secondary | ICD-10-CM | POA: Diagnosis not present

## 2018-02-03 DIAGNOSIS — N183 Chronic kidney disease, stage 3 unspecified: Secondary | ICD-10-CM

## 2018-02-03 LAB — RENAL FUNCTION PANEL
Albumin: 2.9 g/dL — ABNORMAL LOW (ref 3.5–5.0)
Anion gap: 9 (ref 5–15)
BUN: 37 mg/dL — ABNORMAL HIGH (ref 8–23)
CO2: 30 mmol/L (ref 22–32)
Calcium: 8.8 mg/dL — ABNORMAL LOW (ref 8.9–10.3)
Chloride: 106 mmol/L (ref 98–111)
Creatinine, Ser: 2.49 mg/dL — ABNORMAL HIGH (ref 0.44–1.00)
GFR calc Af Amer: 22 mL/min — ABNORMAL LOW (ref >60)
GFR calc non Af Amer: 19 mL/min — ABNORMAL LOW (ref >60)
GLUCOSE: 136 mg/dL — AB (ref 70–99)
Phosphorus: 4.1 mg/dL (ref 2.5–4.6)
Potassium: 3.9 mmol/L (ref 3.5–5.1)
Sodium: 145 mmol/L (ref 135–145)

## 2018-02-03 LAB — IRON AND TIBC
Iron: 104 ug/dL (ref 28–170)
SATURATION RATIOS: 45 % — AB (ref 10.4–31.8)
TIBC: 231 ug/dL — ABNORMAL LOW (ref 250–450)
UIBC: 127 ug/dL

## 2018-02-03 LAB — POCT HEMOGLOBIN-HEMACUE: Hemoglobin: 10.8 g/dL — ABNORMAL LOW (ref 12.0–15.0)

## 2018-02-03 LAB — FERRITIN: Ferritin: 488 ng/mL — ABNORMAL HIGH (ref 11–307)

## 2018-02-03 MED ORDER — EPOETIN ALFA 40000 UNIT/ML IJ SOLN: 30000.0000 [IU] | INTRAMUSCULAR | Status: DC

## 2018-02-03 MED ORDER — EPOETIN ALFA 20000 UNIT/ML IJ SOLN
INTRAMUSCULAR | Status: AC
  Administered 2018-02-03: 20000 [IU] via SUBCUTANEOUS
  Filled 2018-02-03: qty 1

## 2018-02-03 MED ORDER — EPOETIN ALFA 10000 UNIT/ML IJ SOLN
INTRAMUSCULAR | Status: AC
  Administered 2018-02-03: 10000 [IU] via SUBCUTANEOUS
  Filled 2018-02-03: qty 1

## 2018-02-04 DIAGNOSIS — M1711 Unilateral primary osteoarthritis, right knee: Secondary | ICD-10-CM | POA: Diagnosis not present

## 2018-02-04 DIAGNOSIS — M1712 Unilateral primary osteoarthritis, left knee: Secondary | ICD-10-CM | POA: Diagnosis not present

## 2018-02-04 LAB — PTH, INTACT AND CALCIUM
Calcium, Total (PTH): 8.4 mg/dL — ABNORMAL LOW (ref 8.7–10.3)
PTH: 79 pg/mL — ABNORMAL HIGH (ref 15–65)

## 2018-02-05 ENCOUNTER — Other Ambulatory Visit: Payer: Self-pay | Admitting: *Deleted

## 2018-02-05 NOTE — Patient Outreach (Signed)
[facility]) Care Management  02/05/2018  Joann Gutierrez 1945/09/03 [id number]   Call placed to member/caregiver to follow up on member's current health status.  No answer, HIPAA compliant voice message left.  Will await call back, if no call back will follow up within the next 4 business days.  [name], [place], MSN [place] [phone]

## 2018-02-10 DIAGNOSIS — M1711 Unilateral primary osteoarthritis, right knee: Secondary | ICD-10-CM | POA: Diagnosis not present

## 2018-02-10 DIAGNOSIS — Z96652 Presence of left artificial knee joint: Secondary | ICD-10-CM | POA: Diagnosis not present

## 2018-02-10 DIAGNOSIS — M6281 Muscle weakness (generalized): Secondary | ICD-10-CM | POA: Diagnosis not present

## 2018-02-11 ENCOUNTER — Other Ambulatory Visit: Payer: Self-pay | Admitting: *Deleted

## 2018-02-11 NOTE — Patient Outreach (Signed)
[facility]) Care Management  02/11/2018  NATARSHA HURWITZ [birth year] [id number]   Call placed to [facility] caregiver, [name], to follow up on current health status, no answer.  2nd unsuccessful outreach attempt, HIPAA compliant voice message left.  Unsuccessful outreach letter sent, will follow up within the next 4 business days.  [name], [place], MSN [place] [phone]

## 2018-02-12 DIAGNOSIS — M6281 Muscle weakness (generalized): Secondary | ICD-10-CM | POA: Diagnosis not present

## 2018-02-12 DIAGNOSIS — M1711 Unilateral primary osteoarthritis, right knee: Secondary | ICD-10-CM | POA: Diagnosis not present

## 2018-02-12 DIAGNOSIS — Z96652 Presence of left artificial knee joint: Secondary | ICD-10-CM | POA: Diagnosis not present

## 2018-02-17 ENCOUNTER — Encounter (HOSPITAL_COMMUNITY)
Admission: RE | Admit: 2018-02-17 | Discharge: 2018-02-17 | Disposition: A | Discharge status: Home / Self Care | Payer: Medicare Other | Source: Ambulatory Visit | Attending: Nephrology | Admitting: Nephrology

## 2018-02-17 ENCOUNTER — Other Ambulatory Visit: Payer: Self-pay | Admitting: *Deleted

## 2018-02-17 VITALS — BP 168/93 | HR 64 | Temp 97.8°F | Resp 20

## 2018-02-17 DIAGNOSIS — N183 Chronic kidney disease, stage 3 unspecified: Secondary | ICD-10-CM

## 2018-02-17 DIAGNOSIS — D631 Anemia in chronic kidney disease: Secondary | ICD-10-CM | POA: Insufficient documentation

## 2018-02-17 LAB — POCT HEMOGLOBIN-HEMACUE: Hemoglobin: 10.4 g/dL — ABNORMAL LOW (ref 12.0–15.0)

## 2018-02-17 MED ORDER — EPOETIN ALFA 40000 UNIT/ML IJ SOLN: 30000.0000 [IU] | INTRAMUSCULAR | Status: DC

## 2018-02-17 MED ORDER — EPOETIN ALFA 20000 UNIT/ML IJ SOLN
INTRAMUSCULAR | Status: AC
  Administered 2018-02-17: 20000 [IU]
  Filled 2018-02-17: qty 1

## 2018-02-17 MED ORDER — EPOETIN ALFA 10000 UNIT/ML IJ SOLN
INTRAMUSCULAR | Status: AC
  Administered 2018-02-17: 10000 [IU]
  Filled 2018-02-17: qty 1

## 2018-02-17 NOTE — Patient Outreach (Signed)
[facility]) Care Management  02/17/2018  Joann Gutierrez [birth year] [id number]   Call placed to member's caregiver/friend [name] to follow up on current health status.  He report member is doing well.  State she has been attempting to monitor her blood pressure and weight better, does not always record the readings.  He does report her blood pressure has been better controlled since she has been compliant with medications.  He continue to support member with medication management as well as preparing meals according to her diabetic/low salt diet.  Denies any concerns at this time.  Discussed transition to health coach, he agrees.   Will place referral to transition to health coach. Will notify primary MD of transition.  THN CM Care Plan Problem One     Most Recent Value  Care Plan Problem One  Knowledge deficit r/t management of chronic medical condition  Role Documenting the Problem One  Care Management Coordinator  Care Plan for Problem One  Not Active  THN Long Term Goal   Member will report b/p consistentrly at/or below goal (140/90) over the next 45 days  THN Long Term Goal Start Date  11/17/17  [facility] Long Term Goal Met Date  02/17/18  [facility] CM Short Term Goal #1   Member will report compliance with pill packaging and overall medication adherence over the next 4 weeks  THN CM Short Term Goal #1 Start Date  12/25/17 [goal not met, goal reset]  THN CM Short Term Goal #1 Met Date  02/17/18  [facility] CM Short Term Goal #2   Member will report monitoring b/p over the next 4 weeks  THN CM Short Term Goal #2 Start Date  12/25/17  [facility] CM Short Term Goal #2 Met Date  02/17/18 [short term goals not met, will reset]      [name], RN, MSN [place] Manager [phone]

## 2018-02-18 DIAGNOSIS — M6281 Muscle weakness (generalized): Secondary | ICD-10-CM | POA: Diagnosis not present

## 2018-02-18 DIAGNOSIS — M1711 Unilateral primary osteoarthritis, right knee: Secondary | ICD-10-CM | POA: Diagnosis not present

## 2018-02-18 DIAGNOSIS — Z96652 Presence of left artificial knee joint: Secondary | ICD-10-CM | POA: Diagnosis not present

## 2018-02-19 ENCOUNTER — Encounter: Payer: Self-pay | Admitting: Cardiology

## 2018-02-19 NOTE — Progress Notes (Deleted)
Cardiology Office Note   Date:  02/19/2018   ID:  HAYLEA SCHLICHTING, DOB October 14, 1945, MRN [id number]  PCP:  Seward Carol, MD  Cardiologist:   Minus Breeding, MD   No chief complaint on file.     History of Present Illness: Joann Gutierrez is a 73 y.o. female who presents for follow up of chest pain.    ***   Joann Gutierrez is a 73 y.o. female with a history of CVA, HTN, HLD, DM,GERD,seizures, COPD, and CKD, ?HOCM, CP w/ minimal CAD at cath 2015, nl EF by echo 2017, OSA on CPAP  Admitted 8/15-8/23/2019 with chest pain (reproducible on palpation), enzymes negative, after admission patient had an episode of hypotension and altered mental status, acute on chronic kidney disease and hyperkalemia, nephrology eval recommended, statin held for abdominal pain, continue Zetia.  Because of the hypertension, losartan metolazone doxazosin hydralazine and Imdur were on hold.  Patient with widespread soft tissue pain, normal CK, TSH, ESR with minimal elevation in ANA negative 10/15 pulmonary visit, patient blood pressure very elevated, med compliance felt poor   Joann Gutierrez presents for cardiology evaluation and follow up.  She is here with her friend who helps in her care.  She gets chest pains at times. Believes the last episode was last week. It started while she was watching TV. She took SL NTG x 2 and the pain resolved. 7/10, associated w/ SOB, GI upset, no diaphoresis. Sometimes she gets the pain when she is walking. The pain is worse with deep inspiration.   She is not very active, takes PT 2 x week for 1/2 hr.   She was barely able to walk from the lobby to the exam room. Had to use her inhaler.   She was previously not compliant w/ meds. She just started getting them from the pharmacy pre-packaged and that is helping compliance.  Her friend helps make sure she takes her medications.  She saw Dr. Posey Pronto last month, does not have another appointment with  him for several months.  She has lower extremity edema, she has been waking with this.  It is worse now than it was.    Past Medical History:  Diagnosis Date  . Adenomatous colon polyp   . Allergy   . Anemia   . Asthma       . CAD (coronary artery disease)    Mild very minimal coronary disease with 20% obtuse marginal stenosis  . Carpal tunnel syndrome on left   . CHF (congestive heart failure) ([place])   . Chronic kidney disease (CKD), stage III (moderate) (HCC)   . COPD (chronic obstructive pulmonary disease) ([place])   . CVA (cerebral infarction)   . Depression   . 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 Diverticulosis   . 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 Esophageal dysmotility   . Esophageal stricture   . Gastritis   . Gastroparesis 08/21/2007  . GERD (gastroesophageal reflux disease)   . Hair loss   . Hearing loss of both ears   . Hernia, hiatal   . Hyperlipidemia   . Hypertension   . Morbid obesity ([place])   . Obesity   . Osteoarthritis   . OSTEOARTHRITIS 08/09/2006  . Osteoporosis   . 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 RENAL INSUFFICIENCY 02/16/2009  . 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 Seizures ([place])    pt thinks it has been several monthes since she  had a seisure  . Shingles   . Sickle cell trait ([place])   . Stroke ([place])   . Tubular adenoma of colon     Past Surgical History:  Procedure Laterality Date  . ABDOMINAL HYSTERECTOMY    . ARTERY BIOPSY  01/07/2011   Procedure: MINOR BIOPSY TEMPORAL ARTERY;  Surgeon: Haywood Lasso, MD;  Location: [place];  Service: General;  Laterality: Left;  left temporal artery biopsy  . bil foot surgery    . BREAST LUMPECTOMY     benign  . BREAST LUMPECTOMY     both breast lumps removed   . CATARACT EXTRACTION Left 06/2016   Dr. Read Drivers  . COLONOSCOPY    . COLONOSCOPY WITH PROPOFOL N/A 07/05/2015   Procedure: COLONOSCOPY WITH PROPOFOL;  Surgeon: Manus Gunning,  MD;  Location: [facility];  Service: Gastroenterology;  Laterality: N/A;  . ERD  08/08/2000  . ESOPHAGEAL MANOMETRY N/A 03/13/2015   Procedure: ESOPHAGEAL MANOMETRY (EM);  Surgeon: Manus Gunning, MD;  Location: [facility];  Service: Gastroenterology;  Laterality: N/A;  . ESOPHAGOGASTRODUODENOSCOPY  06/25/2006  . ESOPHAGOGASTRODUODENOSCOPY (EGD) WITH PROPOFOL N/A 07/05/2015   Procedure: ESOPHAGOGASTRODUODENOSCOPY (EGD) WITH PROPOFOL;  Surgeon: Manus Gunning, MD;  Location: [facility];  Service: Gastroenterology;  Laterality: N/A;  . HERNIA REPAIR    . LEFT AND RIGHT HEART CATHETERIZATION WITH CORONARY ANGIOGRAM N/A 03/03/2013   Procedure: LEFT AND RIGHT HEART CATHETERIZATION WITH CORONARY ANGIOGRAM;  Surgeon: Minus Breeding, MD;  Location: [facility] CATH LAB;  Service: Cardiovascular;  Laterality: N/A;  . REPLACEMENT TOTAL KNEE Left 1998  . UPPER GASTROINTESTINAL ENDOSCOPY       Current Outpatient Medications  Medication Sig Dispense Refill  . albuterol (PROVENTIL HFA;VENTOLIN HFA) 108 (90 Base) MCG/ACT inhaler Inhale 2 puffs into the lungs every 6 (six) hours as needed for wheezing or shortness of breath. 1 Inhaler 6  . aspirin EC 81 MG tablet Take 81 mg by mouth daily.    Marland Kitchen azelastine (ASTELIN) 0.1 % nasal spray 1 spray 2 (two) times daily. Use in each nostril as directed    . bisoprolol (ZEBETA) 5 MG tablet Take 1 tablet (5 mg total) by mouth 2 (two) times daily. 60 tablet 2  . calcium carbonate (OSCAL) 1500 (600 Ca) MG TABS tablet Take 600 mg of elemental calcium by mouth daily with breakfast.    . donepezil (ARICEPT) 10 MG tablet Take 10 mg by mouth at bedtime.    Marland Kitchen ezetimibe (ZETIA) 10 MG tablet Take 10 mg by mouth daily.     . Fluticasone-Umeclidin-Vilant (TRELEGY ELLIPTA) 100-62.5-25 MCG/INH AEPB Inhale 1 puff into the lungs daily. 28 each 5  . furosemide (LASIX) 80 MG tablet Take 80 mg by mouth 2 (two) times daily.    Marland Kitchen gabapentin (NEURONTIN) 100 MG capsule Take 1  capsule (100 mg total) by mouth 2 (two) times daily.    Marland Kitchen guaiFENesin (MUCINEX) 600 MG 12 hr tablet Take 2 tablets (1,200 mg total) by mouth 2 (two) times daily as needed for cough or to loosen phlegm.    . insulin aspart (NOVOLOG) 100 UNIT/ML injection Inject 5 Units into the skin 3 (three) times daily with meals. (Patient not taking: Reported on 12/23/2017)    . LEVEMIR FLEXTOUCH 100 UNIT/ML Pen Inject 18 Units into the skin daily.    Marland Kitchen losartan (COZAAR) 50 MG tablet Take 50 mg by mouth daily.    . meclizine (ANTIVERT) 25 MG tablet Take 1 tablet (25 mg  total) by mouth 3 (three) times daily as needed for dizziness. 30 tablet 0  . montelukast (SINGULAIR) 10 MG tablet Take 1 tablet (10 mg total) by mouth daily. 30 tablet 3  . nepafenac (ILEVRO) 0.3 % ophthalmic suspension 1 drop daily.    . nitroGLYCERIN (NITROSTAT) 0.4 MG SL tablet PLACE ONE TABLET UNDER THE TONGUE EVERY FIVE MINUTES AS NEEDED FOR CHEST PAIN 25 tablet 6  . nystatin (NYSTATIN) powder Apply topically 4 (four) times daily. Apply to abdominal fold daily PRN    . omeprazole (PRILOSEC) 20 MG capsule Take 20 mg by mouth 2 (two) times daily before a meal.    . OXYGEN Inhale 2 L into the lungs continuous. CPAP with oxygen at bedtime     . raloxifene (EVISTA) 60 MG tablet Take 60 mg by mouth every morning.     . TRUEPLUS PEN NEEDLES 32G X 4 MM MISC INJECT THREE TIMES A DAY AS DIRECTED  1  . Vitamin D, Ergocalciferol, (DRISDOL) 50000 UNITS CAPS capsule Take 50,000 Units by mouth every Monday.      No current facility-administered medications for this visit.     Allergies:   Morphine and related and Promethazine hcl    ROS:  Please see the history of present illness.   Otherwise, review of systems are positive for {NONE DEFAULTED:18576::"none"}.   All other systems are reviewed and negative.    PHYSICAL EXAM: VS:  There were no vitals taken for this visit. , BMI There is no height or weight on file to calculate BMI. GENERAL:  Well  appearing HEENT:  Pupils equal round and reactive, fundi not visualized, oral mucosa unremarkable NECK:  No jugular venous distention, waveform within normal limits, carotid upstroke brisk and symmetric, no bruits, no thyromegaly LYMPHATICS:  No cervical, inguinal adenopathy LUNGS:  Clear to auscultation bilaterally BACK:  No CVA tenderness CHEST:  Unremarkable HEART:  PMI not displaced or sustained,S1 and S2 within normal limits, no S3, no S4, no clicks, no rubs, *** murmurs ABD:  Flat, positive bowel sounds normal in frequency in pitch, no bruits, no rebound, no guarding, no midline pulsatile mass, no hepatomegaly, no splenomegaly EXT:  2 plus pulses throughout, no edema, no cyanosis no clubbing SKIN:  No rashes no nodules NEURO:  Cranial nerves II through XII grossly intact, motor grossly intact throughout PSYCH:  Cognitively intact, oriented to person place and time    EKG:  EKG {ACTION; IS/IS DTO:67124580} ordered today. The ekg ordered today demonstrates ***   Recent Labs: 09/04/2017: B Natriuretic Peptide 58.5 09/05/2017: ALT 8; TSH 2.748 11/20/2017: Platelets 210.0 02/03/2018: BUN 37; Creatinine, Ser 2.49; Potassium 3.9; Sodium 145 02/17/2018: Hemoglobin 10.4    Lipid Panel    Component Value Date/Time   CHOL 238 (H) 09/06/2017 0308   TRIG 174 (H) 09/06/2017 0308   HDL 37 (L) 09/06/2017 0308   CHOLHDL 6.4 09/06/2017 0308   VLDL 35 09/06/2017 0308   LDLCALC 166 (H) 09/06/2017 0308   LDLDIRECT 190.0 01/02/2009 1101      Wt Readings from Last 3 Encounters:  11/27/17 269 lb (122 kg)  11/24/17 273 lb (123.8 kg)  11/20/17 276 lb (125.2 kg)      Other studies Reviewed: Additional studies/ records that were reviewed today include: ***. Review of the above records demonstrates:  Please see elsewhere in the note.  ***   ASSESSMENT AND PLAN:  CHEST PAIN:  ***  ACUTE ON CHRONIC DIASTOLIC HF:  ***  HTN:  ***  Current medicines are reviewed at length with the  patient today.  The patient {ACTIONS; HAS/DOES NOT HAVE:19233} concerns regarding medicines.  The following changes have been made:  {PLAN; NO CHANGE:13088:s}  Labs/ tests ordered today include: *** No orders of the defined types were placed in this encounter.    Disposition:   FU with ***    Signed, Minus Breeding, MD  02/19/2018 8:47 PM    [facility]

## 2018-02-20 ENCOUNTER — Ambulatory Visit: Payer: Medicare Other | Admitting: Cardiology

## 2018-02-20 DIAGNOSIS — M6281 Muscle weakness (generalized): Secondary | ICD-10-CM | POA: Diagnosis not present

## 2018-02-20 DIAGNOSIS — Z96652 Presence of left artificial knee joint: Secondary | ICD-10-CM | POA: Diagnosis not present

## 2018-02-20 DIAGNOSIS — M1711 Unilateral primary osteoarthritis, right knee: Secondary | ICD-10-CM | POA: Diagnosis not present

## 2018-02-23 ENCOUNTER — Encounter: Payer: Self-pay | Admitting: *Deleted

## 2018-02-25 DIAGNOSIS — M6281 Muscle weakness (generalized): Secondary | ICD-10-CM | POA: Diagnosis not present

## 2018-02-25 DIAGNOSIS — Z96652 Presence of left artificial knee joint: Secondary | ICD-10-CM | POA: Diagnosis not present

## 2018-02-25 DIAGNOSIS — M1711 Unilateral primary osteoarthritis, right knee: Secondary | ICD-10-CM | POA: Diagnosis not present

## 2018-02-27 DIAGNOSIS — Z96652 Presence of left artificial knee joint: Secondary | ICD-10-CM | POA: Diagnosis not present

## 2018-02-27 DIAGNOSIS — M1711 Unilateral primary osteoarthritis, right knee: Secondary | ICD-10-CM | POA: Diagnosis not present

## 2018-02-27 DIAGNOSIS — M6281 Muscle weakness (generalized): Secondary | ICD-10-CM | POA: Diagnosis not present

## 2018-03-03 ENCOUNTER — Encounter (HOSPITAL_COMMUNITY): Payer: Medicare Other

## 2018-03-17 ENCOUNTER — Encounter (HOSPITAL_COMMUNITY): Payer: Medicare Other

## 2018-03-19 ENCOUNTER — Encounter: Payer: Self-pay | Admitting: Cardiology

## 2018-03-19 NOTE — Progress Notes (Signed)
Cardiology Office Note   Date:  03/20/2018   ID:  Joann Gutierrez, DOB [birth year], MRN [id number]  PCP:  Seward Carol, MD  Cardiologist:   Minus Breeding, MD   Chief Complaint  Patient presents with  . Chest Pain      History of Present Illness: Joann Gutierrez is a 73 y.o. female who presents for follow up of chest pain.    In the hospital in August with tension and altered mental status.  At a follow-up visit she was occasionally getting chest discomfort.  She has a difficult time getting around because of weakness.  At the last visit she had chest pain but had no significant findings on [place] in Nov 2019.   Since I last saw her she has had no new cardiovascular complaints.  She denies chest pressure or neck discomfort.  She has some chronic dyspnea.  She is had a cold recently which complicates things.  She gets around very slowly because of joint pains and morbid obesity.  She uses a cane.  She does go to physical therapy and she can ride a bike there apparently.  She is had no problems with chest pain or excessive shortness of breath doing this.   Past Medical History:  Diagnosis Date  . Adenomatous colon polyp   . Allergy   . Anemia   . Asthma       . CAD (coronary artery disease)    Mild very minimal coronary disease with 20% obtuse marginal stenosis  . Carpal tunnel syndrome on left   . CHF (congestive heart failure) ([place])   . Chronic kidney disease (CKD), stage III (moderate) (HCC)   . COPD (chronic obstructive pulmonary disease) ([place])   . Depression   . 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 Diverticulosis   . 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 Esophageal dysmotility   . Esophageal stricture   . Gastritis   . Gastroparesis 08/21/2007  . GERD (gastroesophageal reflux disease)   . Hearing loss of both ears   . Hernia, hiatal   . Hyperlipidemia   . Hypertension   . Morbid obesity ([place])   . OSTEOARTHRITIS 08/09/2006  .  Osteoporosis   . 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 RENAL INSUFFICIENCY 02/16/2009  . 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 Seizures ([place])    pt thinks it has been several monthes since she had a seisure  . Shingles   . Sickle cell trait ([place])   . Stroke [facility])     Past Surgical History:  Procedure Laterality Date  . ABDOMINAL HYSTERECTOMY    . ARTERY BIOPSY  01/07/2011   Procedure: MINOR BIOPSY TEMPORAL ARTERY;  Surgeon: Haywood Lasso, MD;  Location: [place];  Service: General;  Laterality: Left;  left temporal artery biopsy  . bil foot surgery    . BREAST LUMPECTOMY     benign  . BREAST LUMPECTOMY     both breast lumps removed   . CATARACT EXTRACTION Left 06/2016   Dr. Read Drivers  . COLONOSCOPY    . COLONOSCOPY WITH PROPOFOL N/A 07/05/2015   Procedure: COLONOSCOPY WITH PROPOFOL;  Surgeon: Manus Gunning, MD;  Location: [facility];  Service: Gastroenterology;  Laterality: N/A;  . ERD  08/08/2000  . ESOPHAGEAL MANOMETRY N/A 03/13/2015   Procedure: ESOPHAGEAL MANOMETRY (EM);  Surgeon: Manus Gunning, MD;  Location: [facility];  Service: Gastroenterology;  Laterality: N/A;  .  ESOPHAGOGASTRODUODENOSCOPY  06/25/2006  . ESOPHAGOGASTRODUODENOSCOPY (EGD) WITH PROPOFOL N/A 07/05/2015   Procedure: ESOPHAGOGASTRODUODENOSCOPY (EGD) WITH PROPOFOL;  Surgeon: Manus Gunning, MD;  Location: [facility];  Service: Gastroenterology;  Laterality: N/A;  . HERNIA REPAIR    . LEFT AND RIGHT HEART CATHETERIZATION WITH CORONARY ANGIOGRAM N/A 03/03/2013   Procedure: LEFT AND RIGHT HEART CATHETERIZATION WITH CORONARY ANGIOGRAM;  Surgeon: Minus Breeding, MD;  Location: [facility] CATH LAB;  Service: Cardiovascular;  Laterality: N/A;  . REPLACEMENT TOTAL KNEE Left 1998  . UPPER GASTROINTESTINAL ENDOSCOPY       Current Outpatient Medications  Medication Sig Dispense Refill  . albuterol (PROVENTIL HFA;VENTOLIN HFA) 108 (90 Base) MCG/ACT inhaler Inhale 2  puffs into the lungs every 6 (six) hours as needed for wheezing or shortness of breath. 1 Inhaler 6  . aspirin EC 81 MG tablet Take 81 mg by mouth daily.    Marland Kitchen azelastine (ASTELIN) 0.1 % nasal spray 1 spray 2 (two) times daily. Use in each nostril as directed    . bisoprolol (ZEBETA) 5 MG tablet Take 1 tablet (5 mg total) by mouth 2 (two) times daily. 60 tablet 2  . calcium carbonate (OSCAL) 1500 (600 Ca) MG TABS tablet Take 600 mg of elemental calcium by mouth daily with breakfast.    . donepezil (ARICEPT) 10 MG tablet Take 10 mg by mouth at bedtime.    Marland Kitchen ezetimibe (ZETIA) 10 MG tablet Take 10 mg by mouth daily.     . Fluticasone-Umeclidin-Vilant (TRELEGY ELLIPTA) 100-62.5-25 MCG/INH AEPB Inhale 1 puff into the lungs daily. 28 each 5  . furosemide (LASIX) 80 MG tablet Take 80 mg by mouth 2 (two) times daily.    Marland Kitchen gabapentin (NEURONTIN) 100 MG capsule Take 1 capsule (100 mg total) by mouth 2 (two) times daily.    Marland Kitchen guaiFENesin (MUCINEX) 600 MG 12 hr tablet Take 2 tablets (1,200 mg total) by mouth 2 (two) times daily as needed for cough or to loosen phlegm.    . insulin aspart (NOVOLOG) 100 UNIT/ML injection Inject 5 Units into the skin 3 (three) times daily with meals.    Marland Kitchen LEVEMIR FLEXTOUCH 100 UNIT/ML Pen Inject 18 Units into the skin daily.    Marland Kitchen losartan (COZAAR) 50 MG tablet Take 50 mg by mouth daily.    . meclizine (ANTIVERT) 25 MG tablet Take 1 tablet (25 mg total) by mouth 3 (three) times daily as needed for dizziness. 30 tablet 0  . montelukast (SINGULAIR) 10 MG tablet Take 1 tablet (10 mg total) by mouth daily. 30 tablet 3  . nepafenac (ILEVRO) 0.3 % ophthalmic suspension 1 drop daily.    . nitroGLYCERIN (NITROSTAT) 0.4 MG SL tablet PLACE ONE TABLET UNDER THE TONGUE EVERY FIVE MINUTES AS NEEDED FOR CHEST PAIN 25 tablet 6  . nystatin (NYSTATIN) powder Apply topically 4 (four) times daily. Apply to abdominal fold daily PRN    . omeprazole (PRILOSEC) 20 MG capsule Take 20 mg by mouth 2 (two)  times daily before a meal.    . OXYGEN Inhale 2 L into the lungs continuous. CPAP with oxygen at bedtime     . raloxifene (EVISTA) 60 MG tablet Take 60 mg by mouth every morning.     . TRUEPLUS PEN NEEDLES 32G X 4 MM MISC INJECT THREE TIMES A DAY AS DIRECTED  1  . Vitamin D, Ergocalciferol, (DRISDOL) 50000 UNITS CAPS capsule Take 50,000 Units by mouth every Monday.     Marland Kitchen amLODipine (NORVASC) 2.5 MG tablet Take  1 tablet (2.5 mg total) by mouth daily. 90 tablet 3   No current facility-administered medications for this visit.     Allergies:   Morphine and related and Promethazine hcl    ROS:  Please see the history of present illness.   Otherwise, review of systems are positive for none.   All other systems are reviewed and negative.    PHYSICAL EXAM: VS:  BP (!) 162/88   Pulse 67   Ht 5\' 4"  (1.626 m)   Wt 272 lb 3.2 oz (123.5 kg)   BMI 46.72 kg/m  , BMI Body mass index is 46.72 kg/m. GENERAL:  Well appearing NECK:  No jugular venous distention, waveform within normal limits, carotid upstroke brisk and symmetric, no bruits, no thyromegaly LUNGS:  Clear to auscultation bilaterally CHEST:  Unremarkable HEART:  PMI not displaced or sustained,S1 and S2 within normal limits, no S3, no S4, no clicks, no rubs, no murmurs ABD:  Flat, positive bowel sounds normal in frequency in pitch, no bruits, no rebound, no guarding, no midline pulsatile mass, no hepatomegaly, no splenomegaly EXT:  2 plus pulses throughout, no edema, no cyanosis no clubbing   EKG:  EKG isordered today. Sinus rhythm, rate 67, axis within normal limits, intervals within normal limits, poor anterior R wave progression   Recent Labs: 09/04/2017: B Natriuretic Peptide 58.5 09/05/2017: ALT 8; TSH 2.748 11/20/2017: Platelets 210.0 02/03/2018: BUN 37; Creatinine, Ser 2.49; Potassium 3.9; Sodium 145 02/17/2018: Hemoglobin 10.4    Lipid Panel    Component Value Date/Time   CHOL 238 (H) 09/06/2017 0308   TRIG 174 (H)  09/06/2017 0308   HDL 37 (L) 09/06/2017 0308   CHOLHDL 6.4 09/06/2017 0308   VLDL 35 09/06/2017 0308   LDLCALC 166 (H) 09/06/2017 0308   LDLDIRECT 190.0 01/02/2009 1101      Wt Readings from Last 3 Encounters:  03/20/18 272 lb 3.2 oz (123.5 kg)  11/27/17 269 lb (122 kg)  11/24/17 273 lb (123.8 kg)      Other studies Reviewed: Additional studies/ records that were reviewed today include: Labs. Review of the above records demonstrates:  Please see elsewhere in the note.     ASSESSMENT AND PLAN:  CHEST PAIN:    She had no high risk findings on perfusion study last year.   No change in therapy is planned no further imaging.  ACUTE ON CHRONIC DIASTOLIC HF:   Her fluid meds have been followed by nephrology.   I think she seems to be euvolemic today.  She has follow-up next month with Dr. Posey Pronto to check her creatinine which was elevated in January compared to her baseline.  HTN: Her blood pressure is not controlled.  I am going to add Norvasc 2.5 mg daily.  I do see that she was on amlodipine in the past and I do not see this listed as an allergy and will start this again.  DM: Her A1c was not controlled.  I have asked her to schedule appointment with Dr. polite to discuss nothing but diabetes management recently when she was 11.5.  DYSLIPIDEMIA: Of note she had pancreatitis and abdominal pain and her statin was held in the past.  Given her multiple comorbidities am somewhat reluctant to restart 1 of these.  She remains on Zetia.  I do not really think she is a candidate for PCSK9 at this point until she gets some of her other risk factors cared for and has better diet.    Current medicines  are reviewed at length with the patient today.  The patient does not have concerns regarding medicines.  The following changes have been made:  no change  Labs/ tests ordered today include:   Orders Placed This Encounter  Procedures  . EKG 12-Lead     Disposition:   FU with Rosaria Ferries,  PAC in six months.     Signed, Minus Breeding, MD  03/20/2018 9:56 AM    [facility]

## 2018-03-20 ENCOUNTER — Ambulatory Visit (INDEPENDENT_AMBULATORY_CARE_PROVIDER_SITE_OTHER): Payer: Medicare Other | Admitting: Cardiology

## 2018-03-20 ENCOUNTER — Encounter: Payer: Self-pay | Admitting: Cardiology

## 2018-03-20 ENCOUNTER — Other Ambulatory Visit: Payer: Self-pay | Admitting: *Deleted

## 2018-03-20 VITALS — BP 162/88 | HR 67 | Ht 64.0 in | Wt 272.2 lb

## 2018-03-20 DIAGNOSIS — R079 Chest pain, unspecified: Secondary | ICD-10-CM

## 2018-03-20 DIAGNOSIS — I10 Essential (primary) hypertension: Secondary | ICD-10-CM

## 2018-03-20 DIAGNOSIS — E785 Hyperlipidemia, unspecified: Secondary | ICD-10-CM | POA: Diagnosis not present

## 2018-03-20 MED ORDER — AMLODIPINE BESYLATE 2.5 MG PO TABS: 2.5000 mg | ORAL_TABLET | Freq: Every day | ORAL | 3 refills | Status: DC

## 2018-03-20 NOTE — Patient Outreach (Signed)
[facility]) Care Management  03/20/2018  Joann Gutierrez 01/13/1946 [id number]   RN Health Coach attempted #1 follow up outreach call to patient.  Patient was unavailable. HIPPA compliance voicemail message left with return callback number.  Plan: RN will call patient again within 30 days.  [place] [facility] Management [phone]

## 2018-03-20 NOTE — Patient Instructions (Signed)
Medication Instructions:  START- Amlodipine 2.5 mg daily   If you need a refill on your cardiac medications before your next appointment, please call your pharmacy.  Labwork: None Ordered   Testing/Procedures: None Ordered   Follow-Up: You will need a follow up appointment in 6 months.  Please call our office 2 months in advance to schedule this appointment.  You may see one of the following Advanced Practice Providers on your designated Care Team:   Rosaria Ferries, PA-C   At [facility], you and your health needs are our priority.  As part of our continuing mission to provide you with exceptional heart care, we have created designated Provider Care Teams.  These Care Teams include your primary Cardiologist (physician) and Advanced Practice Providers (APPs -  Physician Assistants and Nurse Practitioners) who all work together to provide you with the care you need, when you need it.  Thank you for choosing [facility] at [facility]!!

## 2018-03-23 ENCOUNTER — Ambulatory Visit (HOSPITAL_COMMUNITY)
Admission: RE | Admit: 2018-03-23 | Discharge: 2018-03-23 | Disposition: A | Discharge status: Home / Self Care | Payer: Medicare Other | Source: Ambulatory Visit | Attending: Nephrology | Admitting: Nephrology

## 2018-03-23 VITALS — BP 171/89 | Resp 20

## 2018-03-23 DIAGNOSIS — D631 Anemia in chronic kidney disease: Secondary | ICD-10-CM | POA: Diagnosis not present

## 2018-03-23 DIAGNOSIS — N183 Chronic kidney disease, stage 3 unspecified: Secondary | ICD-10-CM

## 2018-03-23 LAB — FERRITIN: Ferritin: 502 ng/mL — ABNORMAL HIGH (ref 11–307)

## 2018-03-23 LAB — RENAL FUNCTION PANEL
Albumin: 2.7 g/dL — ABNORMAL LOW (ref 3.5–5.0)
Anion gap: 11 (ref 5–15)
BUN: 35 mg/dL — AB (ref 8–23)
CO2: 25 mmol/L (ref 22–32)
Calcium: 8 mg/dL — ABNORMAL LOW (ref 8.9–10.3)
Chloride: 106 mmol/L (ref 98–111)
Creatinine, Ser: 2.96 mg/dL — ABNORMAL HIGH (ref 0.44–1.00)
GFR calc Af Amer: 18 mL/min — ABNORMAL LOW (ref >60)
GFR calc non Af Amer: 15 mL/min — ABNORMAL LOW (ref >60)
Glucose, Bld: 279 mg/dL — ABNORMAL HIGH (ref 70–99)
Phosphorus: 3.4 mg/dL (ref 2.5–4.6)
Potassium: 3.8 mmol/L (ref 3.5–5.1)
Sodium: 142 mmol/L (ref 135–145)

## 2018-03-23 LAB — IRON AND TIBC
Iron: 74 ug/dL (ref 28–170)
Saturation Ratios: 38 % — ABNORMAL HIGH (ref 10.4–31.8)
TIBC: 196 ug/dL — ABNORMAL LOW (ref 250–450)
UIBC: 122 ug/dL

## 2018-03-23 LAB — POCT HEMOGLOBIN-HEMACUE: Hemoglobin: 9.6 g/dL — ABNORMAL LOW (ref 12.0–15.0)

## 2018-03-23 MED ORDER — EPOETIN ALFA 40000 UNIT/ML IJ SOLN: 30000.0000 [IU] | INTRAMUSCULAR | Status: DC

## 2018-03-23 MED ORDER — EPOETIN ALFA 20000 UNIT/ML IJ SOLN
INTRAMUSCULAR | Status: AC
  Administered 2018-03-23: 20000 [IU] via SUBCUTANEOUS
  Filled 2018-03-23: qty 1

## 2018-03-23 MED ORDER — EPOETIN ALFA 10000 UNIT/ML IJ SOLN
INTRAMUSCULAR | Status: AC
  Administered 2018-03-23: 10000 [IU] via SUBCUTANEOUS
  Filled 2018-03-23: qty 1

## 2018-03-24 LAB — PTH, INTACT AND CALCIUM
Calcium, Total (PTH): 8 mg/dL — ABNORMAL LOW (ref 8.7–10.3)
PTH: 90 pg/mL — ABNORMAL HIGH (ref 15–65)

## 2018-04-06 ENCOUNTER — Encounter (HOSPITAL_COMMUNITY): Payer: Medicare Other

## 2018-04-07 ENCOUNTER — Other Ambulatory Visit: Payer: Self-pay | Admitting: *Deleted

## 2018-04-08 DIAGNOSIS — I129 Hypertensive chronic kidney disease with stage 1 through stage 4 chronic kidney disease, or unspecified chronic kidney disease: Secondary | ICD-10-CM | POA: Diagnosis not present

## 2018-04-08 DIAGNOSIS — M9080 Osteopathy in diseases classified elsewhere, unspecified site: Secondary | ICD-10-CM | POA: Diagnosis not present

## 2018-04-08 DIAGNOSIS — N390 Urinary tract infection, site not specified: Secondary | ICD-10-CM | POA: Diagnosis not present

## 2018-04-08 DIAGNOSIS — E889 Metabolic disorder, unspecified: Secondary | ICD-10-CM | POA: Diagnosis not present

## 2018-04-08 DIAGNOSIS — N184 Chronic kidney disease, stage 4 (severe): Secondary | ICD-10-CM | POA: Diagnosis not present

## 2018-04-08 DIAGNOSIS — D631 Anemia in chronic kidney disease: Secondary | ICD-10-CM | POA: Diagnosis not present

## 2018-04-09 ENCOUNTER — Ambulatory Visit (HOSPITAL_COMMUNITY)
Admission: RE | Admit: 2018-04-09 | Discharge: 2018-04-09 | Disposition: A | Discharge status: Home / Self Care | Payer: Medicare Other | Source: Ambulatory Visit | Attending: Nephrology | Admitting: Nephrology

## 2018-04-09 ENCOUNTER — Other Ambulatory Visit: Payer: Self-pay

## 2018-04-09 VITALS — BP 166/82 | HR 75 | Temp 98.5°F | Resp 20

## 2018-04-09 DIAGNOSIS — N183 Chronic kidney disease, stage 3 unspecified: Secondary | ICD-10-CM

## 2018-04-09 MED ORDER — EPOETIN ALFA 20000 UNIT/ML IJ SOLN
INTRAMUSCULAR | Status: AC
  Administered 2018-04-09: 20000 [IU] via SUBCUTANEOUS
  Filled 2018-04-09: qty 1

## 2018-04-09 MED ORDER — EPOETIN ALFA 40000 UNIT/ML IJ SOLN: 30000.0000 [IU] | INTRAMUSCULAR | Status: DC

## 2018-04-09 MED ORDER — EPOETIN ALFA 10000 UNIT/ML IJ SOLN
INTRAMUSCULAR | Status: AC
  Administered 2018-04-09: 10000 [IU] via SUBCUTANEOUS
  Filled 2018-04-09: qty 1

## 2018-04-10 LAB — POCT HEMOGLOBIN-HEMACUE: Hemoglobin: 10 g/dL — ABNORMAL LOW (ref 12.0–15.0)

## 2018-04-14 ENCOUNTER — Other Ambulatory Visit: Payer: Self-pay | Admitting: Pulmonary Disease

## 2018-04-20 ENCOUNTER — Ambulatory Visit: Payer: Self-pay | Admitting: *Deleted

## 2018-04-20 ENCOUNTER — Encounter (HOSPITAL_COMMUNITY): Payer: Medicare Other

## 2018-04-20 NOTE — Patient Outreach (Signed)
[facility]) Care Management  04/20/2018 Late entry  Joann Gutierrez 12-20-1945 [id number]  [place] received return  telephone call from patient.  [place] compliance verified. RN discussed the patient A1C. RN explained that the patient checked her blood sugar but didn't remember what it was. RN discussed with caregiver about helping the patient fix healthy snacks. Rn discussed making better food choices when bringing patient take out foods. Caregiver has agreed to follow up outreach calls.   Current Medications:  Current Outpatient Medications  Medication Sig Dispense Refill  . albuterol (PROVENTIL HFA;VENTOLIN HFA) 108 (90 Base) MCG/ACT inhaler Inhale 2 puffs into the lungs every 6 (six) hours as needed for wheezing or shortness of breath. 1 Inhaler 6  . amLODipine (NORVASC) 2.5 MG tablet Take 1 tablet (2.5 mg total) by mouth daily. 90 tablet 3  . aspirin EC 81 MG tablet Take 81 mg by mouth daily.    Marland Kitchen azelastine (ASTELIN) 0.1 % nasal spray 1 spray 2 (two) times daily. Use in each nostril as directed    . bisoprolol (ZEBETA) 5 MG tablet Take 1 tablet (5 mg total) by mouth 2 (two) times daily. 60 tablet 2  . calcium carbonate (OSCAL) 1500 (600 Ca) MG TABS tablet Take 600 mg of elemental calcium by mouth daily with breakfast.    . donepezil (ARICEPT) 10 MG tablet Take 10 mg by mouth at bedtime.    Marland Kitchen ezetimibe (ZETIA) 10 MG tablet Take 10 mg by mouth daily.     . furosemide (LASIX) 80 MG tablet Take 80 mg by mouth 2 (two) times daily.    Marland Kitchen gabapentin (NEURONTIN) 100 MG capsule Take 1 capsule (100 mg total) by mouth 2 (two) times daily.    Marland Kitchen guaiFENesin (MUCINEX) 600 MG 12 hr tablet Take 2 tablets (1,200 mg total) by mouth 2 (two) times daily as needed for cough or to loosen phlegm.    . insulin aspart (NOVOLOG) 100 UNIT/ML injection Inject 5 Units into the skin 3 (three) times daily with meals.    Marland Kitchen LEVEMIR FLEXTOUCH 100 UNIT/ML Pen Inject 18 Units into the skin  daily.    Marland Kitchen losartan (COZAAR) 50 MG tablet Take 50 mg by mouth daily.    . meclizine (ANTIVERT) 25 MG tablet Take 1 tablet (25 mg total) by mouth 3 (three) times daily as needed for dizziness. 30 tablet 0  . montelukast (SINGULAIR) 10 MG tablet Take 1 tablet (10 mg total) by mouth daily. 30 tablet 3  . nepafenac (ILEVRO) 0.3 % ophthalmic suspension 1 drop daily.    . nitroGLYCERIN (NITROSTAT) 0.4 MG SL tablet PLACE ONE TABLET UNDER THE TONGUE EVERY FIVE MINUTES AS NEEDED FOR CHEST PAIN 25 tablet 6  . nystatin (NYSTATIN) powder Apply topically 4 (four) times daily. Apply to abdominal fold daily PRN    . omeprazole (PRILOSEC) 20 MG capsule Take 20 mg by mouth 2 (two) times daily before a meal.    . OXYGEN Inhale 2 L into the lungs continuous. CPAP with oxygen at bedtime     . raloxifene (EVISTA) 60 MG tablet Take 60 mg by mouth every morning.     . TRELEGY ELLIPTA 100-62.5-25 MCG/INH AEPB Inhale 1 puff into the lungs daily. 28 each 2  . TRUEPLUS PEN NEEDLES 32G X 4 MM MISC INJECT THREE TIMES A DAY AS DIRECTED  1  . Vitamin D, Ergocalciferol, (DRISDOL) 50000 UNITS CAPS capsule Take 50,000 Units by mouth every Monday.  No current facility-administered medications for this visit.     Functional Status:  In your present state of health, do you have any difficulty performing the following activities: 04/07/2018 10/20/2017  Hearing? N N  Vision? Y Y  Comment Glaucoma -  Difficulty concentrating or making decisions? Y Y  Comment short term memory loss/Patient needs constant reminding -  Walking or climbing stairs? Y Y  Comment cardiac, obesity -  Dressing or bathing? N Y  Doing errands, shopping? [name]  Comment caregiver takes her -  Conservation officer, nature and eating ? [name]  Comment caregiver assists -  Using the Toilet? N -  In the past six months, have you accidently leaked urine? Y Y  Do you have problems with loss of bowel control? N N  Managing your Medications? Y Y  Comment cargiver arranges -   Managing your Finances? [name]  Comment caregiver handles -  Housekeeping or managing your Housekeeping? [name]  Comment caregiver handles -  Some recent data might be hidden    Fall/Depression Screening: Fall Risk  04/07/2018 10/20/2017 06/27/2017  Falls in the past year? 1 Yes Yes  Number falls in past yr: 1 2 or more 1  Injury with Fall? 0 No No  Risk Factor Category  - High Fall Risk -  Risk for fall due to : History of fall(s);Impaired balance/gait;Impaired mobility History of fall(s) History of fall(s);Impaired vision;Impaired balance/gait;Medication side effect;Impaired mobility  Follow up Falls evaluation completed;Falls prevention discussed Education provided;Falls prevention discussed Falls evaluation completed;Falls prevention discussed  Comment - - -   PHQ 2/9 Scores 04/07/2018 10/20/2017 06/27/2017 12/21/2015 12/11/2015 12/06/2015 08/03/2015  PHQ - 2 Score 0 0 0 0 1 0 0  PHQ- 9 Score - - - - - - -  Exception Documentation - - - - - - -   THN CM Care Plan Problem One     Most Recent Value  Care Plan Problem One  Knowledge Deficit in Self management of Diabetes  Role Documenting the Problem One  [place] for Problem One  Active  THN Long Term Goal   Patient will see a decrease in A1C from 11.5 within the next 90 days  THN Long Term Goal Start Date  04/07/18  Interventions for Problem One Long Term Goal  RN discussed what the A1C was with caregiver. RN discussed that blood sugar should be 80-130 for a decrease in A1C to be <7. RN will follow up with further discussion  THN CM Short Term Goal #1   Caregiver will have better understanding of healthy snacks to help patient to prepare within the next 30 days  THN CM Short Term Goal #1 Start Date  04/07/18  Interventions for Short Term Goal #1  RN discussed healthy snacks that are low carb for the patient to eat. RN sent a list of healthy snacks. RN will follow up with further discussion  THN CM Short Term Goal #2   Patient  caregiver will verbalize having a better understanding of fast food choices he can pick up from take out menus within the next 30 days  THN CM Short Term Goal #2 Start Date  04/07/18  Interventions for Short Term Goal #2  RN discussed with [name] caregiver about foods that he picks up from fast food restaurants making healthy choices. Rn sent a list of dining out menus. RN will follow up with further outreach discussion      Assessment:  Patient  has a short term memory deficit Patient does check blood sugars but can't remember what they are A1C 11.5 Patient and caregiver  will benefit from [place] telephonic outreach for education and support for diabetes self management.  Plan:  RN discussed A1C with Caregiver RN discussed preparing patient healthy snacks RN discussed making choices from dining out RN sent a list of healthy snacks RN sent a list of diabetic dining out menu choices RN sent a barriers letter and assessment to PCP RN will follow up within the month of [place] Management [phone]

## 2018-04-23 ENCOUNTER — Encounter (HOSPITAL_COMMUNITY): Payer: Medicare Other

## 2018-05-07 ENCOUNTER — Encounter (HOSPITAL_COMMUNITY): Payer: Medicare Other

## 2018-06-03 ENCOUNTER — Other Ambulatory Visit: Payer: Self-pay | Admitting: *Deleted

## 2018-06-17 NOTE — Progress Notes (Signed)
Spoke to [place], pt's caregiver and friend to prescreen pt for tomorrow's telephone visit with [place]. Confirmed telephone visit.

## 2018-06-18 ENCOUNTER — Ambulatory Visit (INDEPENDENT_AMBULATORY_CARE_PROVIDER_SITE_OTHER): Payer: Medicare Other | Admitting: Gastroenterology

## 2018-06-18 ENCOUNTER — Other Ambulatory Visit: Payer: Self-pay

## 2018-06-18 ENCOUNTER — Encounter: Payer: Self-pay | Admitting: Gastroenterology

## 2018-06-18 VITALS — Ht 64.0 in | Wt 272.0 lb

## 2018-06-18 DIAGNOSIS — Z860100 Personal history of colon polyps, unspecified: Secondary | ICD-10-CM

## 2018-06-18 DIAGNOSIS — K5900 Constipation, unspecified: Secondary | ICD-10-CM | POA: Diagnosis not present

## 2018-06-18 DIAGNOSIS — K219 Gastro-esophageal reflux disease without esophagitis: Secondary | ICD-10-CM | POA: Diagnosis not present

## 2018-06-18 DIAGNOSIS — Z9981 Dependence on supplemental oxygen: Secondary | ICD-10-CM | POA: Diagnosis not present

## 2018-06-18 MED ORDER — OMEPRAZOLE 40 MG PO CPDR: 40.0000 mg | DELAYED_RELEASE_CAPSULE | Freq: Two times a day (BID) | ORAL | 1 refills | Status: DC

## 2018-06-18 MED ORDER — POLYETHYLENE GLYCOL 3350 17 G PO PACK: 17.0000 g | PACK | Freq: Every day | ORAL | 0 refills | Status: DC | PRN

## 2018-06-18 MED ORDER — ONDANSETRON 4 MG PO TBDP: 4.0000 mg | ORAL_TABLET | Freq: Three times a day (TID) | ORAL | 3 refills | Status: DC | PRN

## 2018-06-18 NOTE — Progress Notes (Signed)
THIS ENCOUNTER IS A VIRTUAL VISIT DUE TO COVID-19 - PATIENT WAS NOT SEEN IN THE OFFICE. PATIENT HAS CONSENTED TO VIRTUAL VISIT / TELEMEDICINE VISIT USING PHONE ONLY   Location of patient: home Location of provider: office Name of referring provider:  Persons participating: myself, patient, friend [name] Time spent on call:  minutes   HPI :  73 y/o female here for a follow up visit. She has oxygen dependant COPD, history of CHF, history of CVA. She has been followed for GERD and history of colon polyps.   Visit done over the phone today. At her last visit with me she had an EGD with empiric dilation of her esophagus to 5mm. She was noted to have gastritis and was H pylori positive. Treated with Pylera and remained H pylori positive on stools studies. Then treated with amoxicillin, clarithromycin, flagyl and repeat stool study negative 10/2015. Regarding her GERD, she is taking prilosec 20mg  once or twice daily. She does not think it is helping too much, she still is having a lot of breakthrough reflux. She has nausea with only rare vomiting. She has a history of gastroparesis in 2009 (slightly delayed emptying) with a normal GES in 2012. She has had Reglan in the past which has helped her symptoms but not taken it recently. She has had an esophageal manometry to evaluate her dysphagia. It showed normal motility on manometry, with the finding of a hypertensive LES, which relaxes normally and no evidence of achalasia.  She is having a BM once every 3-4 days. No blood in the stools. No taking any laxatives at present. She had a colonoscopy in 06/2015 at the hospital and had 4 polyps removed, adenomas, largest 68mm. She was told to repeat a colonoscopy in 3 years.  She denies any new cardiopulmonary symptoms, she has baseline dyspnea. Last Echo and stress test as below. Brother had colon cancer in his 64s   EGD 07/05/2015 - normal esophagus, empiric dilation done to 30mm [place], mild gaastritis,  biopsies done H pylori and positive.   Colonoscopy 07/05/2015 - One 4 mm polyp in the cecum, removed with a cold snare. Resected and retrieved. - One 3 mm polyp in the cecum, removed with a cold biopsy forceps. Resected and retrieved. - One 12 mm polyp in the ascending colon, removed with a hot snare. Resected and retrieved. - One 8 mm polyp in the ascending colon, removed with a hot snare. Resected and retrieved. - Diverticulosis in the transverse colon and in the left colon. - Non-bleeding internal hemorrhoids. - The examination was otherwise normal. Adenomas on path   Echo 09/08/17 - 65-70% EF Low risk nuclear stress 11/2017  Past Medical History:  Diagnosis Date   Adenomatous colon polyp    Allergy    Anemia    Asthma        CAD (coronary artery disease)    Mild very minimal coronary disease with 20% obtuse marginal stenosis   Carpal tunnel syndrome on left    CHF (congestive heart failure) (HCC)    Chronic kidney disease (CKD), stage III (moderate) (HCC)    COPD (chronic obstructive pulmonary disease) ([place])    Depression    Diabetes mellitus 1997   Type II    Diverticulosis    Elevated diaphragm November 2011   Right side   Esophageal dysmotility    Esophageal stricture    Gastritis    Gastroparesis 08/21/2007   GERD (gastroesophageal reflux disease)    Hearing loss of both  ears    Hernia, hiatal    Hyperlipidemia    Hypertension    Morbid obesity ([place])    OSTEOARTHRITIS 08/09/2006   Osteoporosis    PERIPHERAL NEUROPATHY, FEET 09/23/2007   RENAL INSUFFICIENCY 02/16/2009   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   Seizures ([place])    pt thinks it has been several monthes since she had a seisure   Shingles    Sickle cell trait ([place])    Stroke [facility])      Past Surgical History:  Procedure Laterality Date   ABDOMINAL HYSTERECTOMY     ARTERY BIOPSY  01/07/2011   Procedure: MINOR BIOPSY TEMPORAL ARTERY;  Surgeon: Haywood Lasso, MD;  Location: [place];  Service: General;  Laterality: Left;  left temporal artery biopsy   bil foot surgery     BREAST LUMPECTOMY     benign   BREAST LUMPECTOMY     both breast lumps removed    CATARACT EXTRACTION Left 06/2016   Dr. Read Drivers   COLONOSCOPY     COLONOSCOPY WITH PROPOFOL N/A 07/05/2015   Procedure: COLONOSCOPY WITH PROPOFOL;  Surgeon: Manus Gunning, MD;  Location: [facility];  Service: Gastroenterology;  Laterality: N/A;   ERD  08/08/2000   ESOPHAGEAL MANOMETRY N/A 03/13/2015   Procedure: ESOPHAGEAL MANOMETRY (EM);  Surgeon: Manus Gunning, MD;  Location: [facility];  Service: Gastroenterology;  Laterality: N/A;   ESOPHAGOGASTRODUODENOSCOPY  06/25/2006   ESOPHAGOGASTRODUODENOSCOPY (EGD) WITH PROPOFOL N/A 07/05/2015   Procedure: ESOPHAGOGASTRODUODENOSCOPY (EGD) WITH PROPOFOL;  Surgeon: Manus Gunning, MD;  [facility];  Service: Gastroenterology;  Laterality: N/A;   HERNIA REPAIR     LEFT AND RIGHT HEART CATHETERIZATION WITH CORONARY ANGIOGRAM N/A 03/03/2013   Procedure: LEFT AND RIGHT HEART CATHETERIZATION WITH CORONARY ANGIOGRAM;  Surgeon: Minus Breeding, MD;  Location: [facility] CATH LAB;  Service: Cardiovascular;  Laterality: N/A;   REPLACEMENT TOTAL KNEE Left 1998   UPPER GASTROINTESTINAL ENDOSCOPY     Family History  Problem Relation Age of Onset   Liver cancer Mother        Liver Cancer   Diabetes Mother    Kidney disease Mother    Heart disease Mother        age 73's   Heart disease Father 45       MI   Heart attack Father        died of MI when pt was 5   Sickle cell anemia Father    Colon cancer Brother    Cancer Brother        Colon Cancer   Diabetes Sister    Kidney disease Sister    Heart disease Sister        age 45's   Allergies Sister    Diabetes Sister    Kidney disease Sister    Heart disease Sister        age 51's   Esophageal cancer Neg Hx     Rectal cancer Neg Hx    Stomach cancer Neg Hx    Amblyopia Neg Hx    Blindness Neg Hx    Glaucoma Neg Hx    Macular degeneration Neg Hx    Retinal detachment Neg Hx    Cataracts Neg Hx    Strabismus Neg Hx    Retinitis pigmentosa Neg Hx    Social History   Tobacco Use   Smoking status: Former Smoker    Packs/day: 0.50    Years: 10.00    Pack  years: 5.00    Last attempt to quit: 04/18/1980    Years since quitting: 38.1   Smokeless tobacco: Never Used  Substance Use Topics   Alcohol use: No    Alcohol/week: 0.0 standard drinks   Drug use: No   Current Outpatient Medications  Medication Sig Dispense Refill   albuterol (PROVENTIL HFA;VENTOLIN HFA) 108 (90 Base) MCG/ACT inhaler Inhale 2 puffs into the lungs every 6 (six) hours as needed for wheezing or shortness of breath. 1 Inhaler 6   amLODipine (NORVASC) 2.5 MG tablet Take 1 tablet (2.5 mg total) by mouth daily. 90 tablet 3   aspirin EC 81 MG tablet Take 81 mg by mouth daily.     atorvastatin (LIPITOR) 40 MG tablet Take 40 mg by mouth daily.     azelastine (ASTELIN) 0.1 % nasal spray 1 spray 2 (two) times daily. Use in each nostril as directed     bisoprolol (ZEBETA) 5 MG tablet Take 1 tablet (5 mg total) by mouth 2 (two) times daily. 60 tablet 2   calcium carbonate (OSCAL) 1500 (600 Ca) MG TABS tablet Take 600 mg of elemental calcium by mouth daily with breakfast.     donepezil (ARICEPT) 10 MG tablet Take 10 mg by mouth at bedtime.     ezetimibe (ZETIA) 10 MG tablet Take 10 mg by mouth daily.      furosemide (LASIX) 80 MG tablet Take 80 mg by mouth 2 (two) times daily.     gabapentin (NEURONTIN) 100 MG capsule Take 1 capsule (100 mg total) by mouth 2 (two) times daily.     guaiFENesin (MUCINEX) 600 MG 12 hr tablet Take 2 tablets (1,200 mg total) by mouth 2 (two) times daily as needed for cough or to loosen phlegm.     insulin aspart (NOVOLOG) 100 UNIT/ML injection Inject 5 Units into the skin 3 (three)  times daily with meals.     LEVEMIR FLEXTOUCH 100 UNIT/ML Pen Inject 18 Units into the skin daily.     losartan (COZAAR) 50 MG tablet Take 100 mg by mouth daily.      meclizine (ANTIVERT) 25 MG tablet Take 1 tablet (25 mg total) by mouth 3 (three) times daily as needed for dizziness. 30 tablet 0   montelukast (SINGULAIR) 10 MG tablet Take 1 tablet (10 mg total) by mouth daily. (Patient taking differently: Take 10 mg by mouth daily as needed. ) 30 tablet 3   nepafenac (ILEVRO) 0.3 % ophthalmic suspension 1 drop daily as needed.      nitroGLYCERIN (NITROSTAT) 0.4 MG SL tablet PLACE ONE TABLET UNDER THE TONGUE EVERY FIVE MINUTES AS NEEDED FOR CHEST PAIN 25 tablet 6   nystatin (NYSTATIN) powder Apply topically 4 (four) times daily. Apply to abdominal fold daily PRN     omeprazole (PRILOSEC) 20 MG capsule Take 20 mg by mouth 2 (two) times daily before a meal.     OXYGEN Inhale 2 L into the lungs continuous. CPAP with oxygen at bedtime      raloxifene (EVISTA) 60 MG tablet Take 60 mg by mouth every morning.      TRELEGY ELLIPTA 100-62.5-25 MCG/INH AEPB Inhale 1 puff into the lungs daily. 28 each 2   TRUEPLUS PEN NEEDLES 32G X 4 MM MISC INJECT THREE TIMES A DAY AS DIRECTED  1   Vitamin D, Ergocalciferol, (DRISDOL) 50000 UNITS CAPS capsule Take 50,000 Units by mouth every Monday.      No current facility-administered medications for this visit.  Allergies  Allergen Reactions   Morphine And Related Other (See Comments)    Family request not to be given, reports pt does not wake up when given    Promethazine Hcl Other (See Comments)    REACTION: lethargy     Review of Systems: All systems reviewed and negative except where noted in HPI.   Lab Results  Component Value Date   WBC 7.2 11/20/2017   HGB 10.0 (L) 04/09/2018   HCT 32.1 (L) 11/20/2017   MCV 71.8 (L) 11/20/2017   PLT 210.0 11/20/2017    Lab Results  Component Value Date   CREATININE 2.96 (H) 03/23/2018   BUN 35  (H) 03/23/2018   NA 142 03/23/2018   K 3.8 03/23/2018   CL 106 03/23/2018   CO2 25 03/23/2018    Lab Results  Component Value Date   ALT 8 09/05/2017   AST 14 (L) 09/05/2017   ALKPHOS 85 09/05/2017   BILITOT 0.9 09/05/2017    Lab Results  Component Value Date   IRON 74 03/23/2018   TIBC 196 (L) 03/23/2018   FERRITIN 502 (H) 03/23/2018      Physical Exam: Ht 5\' 4"  (1.626 m)    Wt 272 lb (123.4 kg)    BMI 46.69 kg/m  NA   ASSESSMENT AND PLAN: 73 y/o female here for reassessment of the following issues:  GERD / gastroparesis - ongoing reflux which bothers her, history of gastroparesis, previously on Reglan but not taking it. Symptoms mild right now. Discussed options. Will increase her omeprazole to 40mg  BID for a few weeks and see if that helps her reflux, and then use Zofran PRN for the nausea. If she has vomiting or symptoms not well controlled on this regimen will consider using Reglan again but will hold for now in light of possible side effects and await her course on this regimen. H pylori eradicated based on last stool test.  Constipation - recommend Miralax once to twice daily and titrate up as needed.  History of colon adenomas - we discussed with her comorbidities if she wished to have any further colonoscopy exams. She had a > 1cm adenoma on her last exam and strong FH of colon cancer. She thinks her respiratory status is stable and wishes to have one more colonoscopy, if no high risk pathology I think this will probably be her last exam. Given her oxygen dependence her exam needs to be done at the hospital with anesthesia support. She is agreeable to that. She will be placed on the wait list, as access to the hospital for elective cases right now is limited due to coronavirus is limited. She will be contacted for scheduling in the next few months.   [place] Cellar, MD [facility] Gastroenterology

## 2018-06-18 NOTE — Patient Instructions (Signed)
If you are age 73 or older, your body mass index should be between 23-30. Your Body mass index is 46.69 kg/m. If this is out of the aforementioned range listed, please consider follow up with your Primary Care Provider.  If you are age 33 or younger, your body mass index should be between 19-25. Your Body mass index is 46.69 kg/m. If this is out of the aformentioned range listed, please consider follow up with your Primary Care Provider.   To help prevent the possible spread of infection to our patients, communities, and staff; we will be implementing the following measures:  As of now we are not allowing any visitors/family members to accompany you to any upcoming appointments with [facility] Gastroenterology. If you have any concerns about this please contact our office to discuss prior to the appointment.   We have sent the following medications to your pharmacy for you to pick up at your convenience: Omeprazole 40mg : Take twice a day  Zofran 4mg : orally dissolving tablet, take every 8 hours as needed for nausea  Please purchase the following medications over the counter and take as directed: Miralax, take as directed, as needed  We will contact you to schedule you for a colonoscopy at [facility] when the schedule becomes available later this summer.   Thank you for entrusting me with your care and for choosing [facility], Dr. [place] Cellar

## 2018-06-22 ENCOUNTER — Ambulatory Visit (HOSPITAL_COMMUNITY)
Admission: RE | Admit: 2018-06-22 | Discharge: 2018-06-22 | Disposition: A | Discharge status: Home / Self Care | Payer: Medicare Other | Source: Ambulatory Visit | Attending: Nephrology | Admitting: Nephrology

## 2018-06-22 ENCOUNTER — Other Ambulatory Visit: Payer: Self-pay

## 2018-06-22 VITALS — BP 176/93 | HR 79 | Temp 96.7°F | Resp 20

## 2018-06-22 DIAGNOSIS — N183 Chronic kidney disease, stage 3 unspecified: Secondary | ICD-10-CM

## 2018-06-22 DIAGNOSIS — D631 Anemia in chronic kidney disease: Secondary | ICD-10-CM | POA: Insufficient documentation

## 2018-06-22 LAB — IRON AND TIBC
Iron: 61 ug/dL (ref 28–170)
Saturation Ratios: 29 % (ref 10.4–31.8)
TIBC: 211 ug/dL — ABNORMAL LOW (ref 250–450)
UIBC: 150 ug/dL

## 2018-06-22 LAB — FERRITIN: Ferritin: 624 ng/mL — ABNORMAL HIGH (ref 11–307)

## 2018-06-22 LAB — RENAL FUNCTION PANEL
Albumin: 3 g/dL — ABNORMAL LOW (ref 3.5–5.0)
Anion gap: 14 (ref 5–15)
BUN: 30 mg/dL — ABNORMAL HIGH (ref 8–23)
CO2: 25 mmol/L (ref 22–32)
Calcium: 8.5 mg/dL — ABNORMAL LOW (ref 8.9–10.3)
Chloride: 105 mmol/L (ref 98–111)
Creatinine, Ser: 2.88 mg/dL — ABNORMAL HIGH (ref 0.44–1.00)
GFR calc Af Amer: 18 mL/min — ABNORMAL LOW (ref >60)
GFR calc non Af Amer: 16 mL/min — ABNORMAL LOW (ref >60)
Glucose, Bld: 202 mg/dL — ABNORMAL HIGH (ref 70–99)
Phosphorus: 4.1 mg/dL (ref 2.5–4.6)
Potassium: 3.6 mmol/L (ref 3.5–5.1)
Sodium: 144 mmol/L (ref 135–145)

## 2018-06-22 LAB — POCT HEMOGLOBIN-HEMACUE: Hemoglobin: 8.7 g/dL — ABNORMAL LOW (ref 12.0–15.0)

## 2018-06-22 MED ORDER — EPOETIN ALFA 40000 UNIT/ML IJ SOLN: 30000.0000 [IU] | INTRAMUSCULAR | Status: DC

## 2018-06-22 MED ORDER — EPOETIN ALFA 20000 UNIT/ML IJ SOLN
INTRAMUSCULAR | Status: AC
  Administered 2018-06-22: 20000 [IU] via SUBCUTANEOUS
  Filled 2018-06-22: qty 1

## 2018-06-22 MED ORDER — EPOETIN ALFA 10000 UNIT/ML IJ SOLN
INTRAMUSCULAR | Status: AC
  Administered 2018-06-22: 10000 [IU] via SUBCUTANEOUS
  Filled 2018-06-22: qty 1

## 2018-06-23 LAB — PTH, INTACT AND CALCIUM
Calcium, Total (PTH): 8.4 mg/dL — ABNORMAL LOW (ref 8.7–10.3)
PTH: 110 pg/mL — ABNORMAL HIGH (ref 15–65)

## 2018-06-25 DIAGNOSIS — Z6841 Body Mass Index (BMI) 40.0 and over, adult: Secondary | ICD-10-CM | POA: Diagnosis not present

## 2018-06-25 DIAGNOSIS — N390 Urinary tract infection, site not specified: Secondary | ICD-10-CM | POA: Diagnosis not present

## 2018-06-25 DIAGNOSIS — R350 Frequency of micturition: Secondary | ICD-10-CM | POA: Diagnosis not present

## 2018-06-25 DIAGNOSIS — Z1231 Encounter for screening mammogram for malignant neoplasm of breast: Secondary | ICD-10-CM | POA: Diagnosis not present

## 2018-06-25 DIAGNOSIS — Z01419 Encounter for gynecological examination (general) (routine) without abnormal findings: Secondary | ICD-10-CM | POA: Diagnosis not present

## 2018-07-06 ENCOUNTER — Other Ambulatory Visit: Payer: Self-pay | Admitting: Pulmonary Disease

## 2018-07-06 ENCOUNTER — Other Ambulatory Visit: Payer: Self-pay

## 2018-07-06 ENCOUNTER — Ambulatory Visit (HOSPITAL_COMMUNITY)
Admission: RE | Admit: 2018-07-06 | Discharge: 2018-07-06 | Disposition: A | Discharge status: Home / Self Care | Payer: Medicare Other | Source: Ambulatory Visit | Attending: Nephrology | Admitting: Nephrology

## 2018-07-06 DIAGNOSIS — I10 Essential (primary) hypertension: Secondary | ICD-10-CM | POA: Insufficient documentation

## 2018-07-06 MED ORDER — CLONIDINE HCL 0.1 MG PO TABS
0.1000 mg | ORAL_TABLET | Freq: Once | ORAL | Status: AC
  Administered 2018-07-06: 0.1 mg via ORAL

## 2018-07-06 MED ORDER — CLONIDINE HCL 0.1 MG PO TABS
ORAL_TABLET | ORAL | Status: AC
  Filled 2018-07-06: qty 1

## 2018-07-06 MED ORDER — CLONIDINE HCL 0.1 MG PO TABS
ORAL_TABLET | ORAL | Status: AC
  Administered 2018-07-06: 11:00:00 0.1 mg via ORAL
  Filled 2018-07-06: qty 1

## 2018-07-06 NOTE — Progress Notes (Signed)
Patient came for treatment.  BP 202/93 HR 66 at 1117.  Gave patient 0.1mg  of clonidine per order at 1129.  Rechecked BP is 214/100 at 1203 per order.  Called provider since SBP >180.  Provider ordered clonidine 0.1mg  x 1 dose, to not give injection, and reschedule patient in one week.  Patient has been given medication and rescheduled.

## 2018-07-13 ENCOUNTER — Ambulatory Visit (HOSPITAL_COMMUNITY)
Admission: RE | Admit: 2018-07-13 | Discharge: 2018-07-13 | Disposition: A | Discharge status: Home / Self Care | Payer: Medicare Other | Source: Ambulatory Visit | Attending: Nephrology | Admitting: Nephrology

## 2018-07-13 ENCOUNTER — Other Ambulatory Visit: Payer: Self-pay

## 2018-07-13 VITALS — BP 158/79 | HR 98 | Temp 97.3°F | Resp 20

## 2018-07-13 DIAGNOSIS — N183 Chronic kidney disease, stage 3 unspecified: Secondary | ICD-10-CM

## 2018-07-13 LAB — POCT HEMOGLOBIN-HEMACUE: Hemoglobin: 8.9 g/dL — ABNORMAL LOW (ref 12.0–15.0)

## 2018-07-13 MED ORDER — EPOETIN ALFA 20000 UNIT/ML IJ SOLN
INTRAMUSCULAR | Status: AC
  Administered 2018-07-13: 20000 [IU] via SUBCUTANEOUS
  Filled 2018-07-13: qty 1

## 2018-07-13 MED ORDER — EPOETIN ALFA 40000 UNIT/ML IJ SOLN: 30000.0000 [IU] | INTRAMUSCULAR | Status: DC

## 2018-07-13 MED ORDER — EPOETIN ALFA 10000 UNIT/ML IJ SOLN
INTRAMUSCULAR | Status: AC
  Administered 2018-07-13: 10000 [IU] via SUBCUTANEOUS
  Filled 2018-07-13: qty 1

## 2018-07-15 DIAGNOSIS — I129 Hypertensive chronic kidney disease with stage 1 through stage 4 chronic kidney disease, or unspecified chronic kidney disease: Secondary | ICD-10-CM | POA: Diagnosis not present

## 2018-07-15 DIAGNOSIS — N184 Chronic kidney disease, stage 4 (severe): Secondary | ICD-10-CM | POA: Diagnosis not present

## 2018-07-15 DIAGNOSIS — M9080 Osteopathy in diseases classified elsewhere, unspecified site: Secondary | ICD-10-CM | POA: Diagnosis not present

## 2018-07-15 DIAGNOSIS — D631 Anemia in chronic kidney disease: Secondary | ICD-10-CM | POA: Diagnosis not present

## 2018-07-15 DIAGNOSIS — E889 Metabolic disorder, unspecified: Secondary | ICD-10-CM | POA: Diagnosis not present

## 2018-07-20 ENCOUNTER — Encounter (HOSPITAL_COMMUNITY): Payer: Medicare Other

## 2018-07-22 ENCOUNTER — Other Ambulatory Visit: Payer: Self-pay

## 2018-07-22 DIAGNOSIS — Z86010 Personal history of colonic polyps: Secondary | ICD-10-CM

## 2018-07-22 DIAGNOSIS — K5900 Constipation, unspecified: Secondary | ICD-10-CM

## 2018-07-23 ENCOUNTER — Telehealth: Payer: Self-pay

## 2018-07-23 NOTE — Telephone Encounter (Signed)
Patient scheduled for Colonoscopy @ [facility] 08/04/18 at 11:00am. Patient to arrive at 9:30am. COVID testing scheduled 07/31/18 between 9am-3pm at [place]. Patient agrees to quarantine requirements. Pre visit scheduled 07/28/18 at 4:30pm via phone. Patient aware of all the above.

## 2018-07-27 ENCOUNTER — Encounter (HOSPITAL_COMMUNITY)
Admission: RE | Admit: 2018-07-27 | Discharge: 2018-07-27 | Disposition: A | Discharge status: Home / Self Care | Payer: Medicare Other | Source: Ambulatory Visit | Attending: Nephrology | Admitting: Nephrology

## 2018-07-27 ENCOUNTER — Other Ambulatory Visit: Payer: Self-pay

## 2018-07-27 VITALS — BP 168/79 | HR 92 | Temp 97.6°F | Resp 20

## 2018-07-27 DIAGNOSIS — N183 Chronic kidney disease, stage 3 unspecified: Secondary | ICD-10-CM

## 2018-07-27 DIAGNOSIS — D631 Anemia in chronic kidney disease: Secondary | ICD-10-CM | POA: Insufficient documentation

## 2018-07-27 LAB — RENAL FUNCTION PANEL
Albumin: 3 g/dL — ABNORMAL LOW (ref 3.5–5.0)
Anion gap: 10 (ref 5–15)
BUN: 44 mg/dL — ABNORMAL HIGH (ref 8–23)
CO2: 23 mmol/L (ref 22–32)
Calcium: 8.4 mg/dL — ABNORMAL LOW (ref 8.9–10.3)
Chloride: 109 mmol/L (ref 98–111)
Creatinine, Ser: 3.58 mg/dL — ABNORMAL HIGH (ref 0.44–1.00)
GFR calc Af Amer: 14 mL/min — ABNORMAL LOW (ref >60)
GFR calc non Af Amer: 12 mL/min — ABNORMAL LOW (ref >60)
Glucose, Bld: 234 mg/dL — ABNORMAL HIGH (ref 70–99)
Phosphorus: 3.5 mg/dL (ref 2.5–4.6)
Potassium: 3.8 mmol/L (ref 3.5–5.1)
Sodium: 142 mmol/L (ref 135–145)

## 2018-07-27 LAB — IRON AND TIBC
Iron: 89 ug/dL (ref 28–170)
Saturation Ratios: 41 % — ABNORMAL HIGH (ref 10.4–31.8)
TIBC: 216 ug/dL — ABNORMAL LOW (ref 250–450)
UIBC: 127 ug/dL

## 2018-07-27 LAB — FERRITIN: Ferritin: 572 ng/mL — ABNORMAL HIGH (ref 11–307)

## 2018-07-27 LAB — POCT HEMOGLOBIN-HEMACUE: Hemoglobin: 9.4 g/dL — ABNORMAL LOW (ref 12.0–15.0)

## 2018-07-27 MED ORDER — EPOETIN ALFA 40000 UNIT/ML IJ SOLN: 30000.0000 [IU] | INTRAMUSCULAR | Status: DC

## 2018-07-27 MED ORDER — EPOETIN ALFA 20000 UNIT/ML IJ SOLN
INTRAMUSCULAR | Status: AC
  Administered 2018-07-27: 20000 [IU] via SUBCUTANEOUS
  Filled 2018-07-27: qty 1

## 2018-07-27 MED ORDER — EPOETIN ALFA 10000 UNIT/ML IJ SOLN
INTRAMUSCULAR | Status: AC
  Administered 2018-07-27: 10000 [IU] via SUBCUTANEOUS
  Filled 2018-07-27: qty 1

## 2018-07-28 ENCOUNTER — Ambulatory Visit (AMBULATORY_SURGERY_CENTER): Payer: Medicare Other | Admitting: *Deleted

## 2018-07-28 ENCOUNTER — Other Ambulatory Visit: Payer: Self-pay

## 2018-07-28 VITALS — Ht 64.5 in | Wt 272.0 lb

## 2018-07-28 DIAGNOSIS — Z86010 Personal history of colonic polyps: Secondary | ICD-10-CM

## 2018-07-28 LAB — PTH, INTACT AND CALCIUM
Calcium, Total (PTH): 8.7 mg/dL (ref 8.7–10.3)
PTH: 85 pg/mL — ABNORMAL HIGH (ref 15–65)

## 2018-07-28 MED ORDER — SUPREP BOWEL PREP KIT 17.5-3.13-1.6 GM/177ML PO SOLN: 1.0000 | Freq: Once | ORAL | 0 refills | Status: AC

## 2018-07-28 NOTE — Progress Notes (Signed)
Patient denies any allergies to egg or soy products. Patient denies complications with anesthesia/sedation.  Patient denies oxygen use at home and denies diet medications.   Pt verified name, DOB, address and insurance during PV today. Pt mailed instruction packet to included paper to complete and mail back to [facility] with addressed and stamped envelope, Emmi video, copy of consent form to read and not return, and instructions.  PV completed over the phone. Pt encouraged to call with questions or issues.

## 2018-07-29 DIAGNOSIS — I129 Hypertensive chronic kidney disease with stage 1 through stage 4 chronic kidney disease, or unspecified chronic kidney disease: Secondary | ICD-10-CM | POA: Diagnosis not present

## 2018-07-31 ENCOUNTER — Other Ambulatory Visit (HOSPITAL_COMMUNITY)
Admission: RE | Admit: 2018-07-31 | Discharge: 2018-07-31 | Disposition: A | Discharge status: Home / Self Care | Payer: Medicare Other | Source: Ambulatory Visit | Attending: Gastroenterology | Admitting: Gastroenterology

## 2018-07-31 DIAGNOSIS — Z01812 Encounter for preprocedural laboratory examination: Secondary | ICD-10-CM | POA: Diagnosis not present

## 2018-07-31 DIAGNOSIS — Z1159 Encounter for screening for other viral diseases: Secondary | ICD-10-CM | POA: Diagnosis not present

## 2018-08-01 LAB — SARS CORONAVIRUS 2 (TAT 6-24 HRS): SARS Coronavirus 2: NEGATIVE

## 2018-08-03 ENCOUNTER — Encounter (HOSPITAL_COMMUNITY): Payer: Self-pay | Admitting: *Deleted

## 2018-08-03 ENCOUNTER — Other Ambulatory Visit: Payer: Self-pay

## 2018-08-03 NOTE — Progress Notes (Signed)
Pre procedure call, Pt request writer talk with Jolyn Nap who will accompany her tomorrow (along with her son). He also completes phone interview stating that Pt has memory loss and needs a wheelchair upon arrival. Advised to bring Cpap mask and tubing.

## 2018-08-04 ENCOUNTER — Other Ambulatory Visit: Payer: Self-pay

## 2018-08-04 ENCOUNTER — Encounter (HOSPITAL_COMMUNITY)
Admission: RE | Disposition: A | Discharge status: Home / Self Care | Payer: Self-pay | Source: Home / Self Care | Attending: Gastroenterology

## 2018-08-04 ENCOUNTER — Ambulatory Visit (HOSPITAL_COMMUNITY): Payer: Medicare Other | Admitting: Certified Registered"

## 2018-08-04 ENCOUNTER — Ambulatory Visit (HOSPITAL_COMMUNITY)
Admission: RE | Admit: 2018-08-04 | Discharge: 2018-08-04 | Disposition: A | Discharge status: Home / Self Care | Payer: Medicare Other | Attending: Gastroenterology | Admitting: Gastroenterology

## 2018-08-04 DIAGNOSIS — E785 Hyperlipidemia, unspecified: Secondary | ICD-10-CM | POA: Insufficient documentation

## 2018-08-04 DIAGNOSIS — Z8673 Personal history of transient ischemic attack (TIA), and cerebral infarction without residual deficits: Secondary | ICD-10-CM | POA: Insufficient documentation

## 2018-08-04 DIAGNOSIS — K5730 Diverticulosis of large intestine without perforation or abscess without bleeding: Secondary | ICD-10-CM | POA: Insufficient documentation

## 2018-08-04 DIAGNOSIS — Z9981 Dependence on supplemental oxygen: Secondary | ICD-10-CM | POA: Diagnosis not present

## 2018-08-04 DIAGNOSIS — K5909 Other constipation: Secondary | ICD-10-CM | POA: Diagnosis not present

## 2018-08-04 DIAGNOSIS — Z6841 Body Mass Index (BMI) 40.0 and over, adult: Secondary | ICD-10-CM | POA: Insufficient documentation

## 2018-08-04 DIAGNOSIS — E1142 Type 2 diabetes mellitus with diabetic polyneuropathy: Secondary | ICD-10-CM | POA: Diagnosis not present

## 2018-08-04 DIAGNOSIS — Z888 Allergy status to other drugs, medicaments and biological substances status: Secondary | ICD-10-CM | POA: Insufficient documentation

## 2018-08-04 DIAGNOSIS — Z885 Allergy status to narcotic agent status: Secondary | ICD-10-CM | POA: Diagnosis not present

## 2018-08-04 DIAGNOSIS — M1990 Unspecified osteoarthritis, unspecified site: Secondary | ICD-10-CM | POA: Diagnosis not present

## 2018-08-04 DIAGNOSIS — E1122 Type 2 diabetes mellitus with diabetic chronic kidney disease: Secondary | ICD-10-CM | POA: Diagnosis not present

## 2018-08-04 DIAGNOSIS — J45909 Unspecified asthma, uncomplicated: Secondary | ICD-10-CM | POA: Diagnosis not present

## 2018-08-04 DIAGNOSIS — K219 Gastro-esophageal reflux disease without esophagitis: Secondary | ICD-10-CM | POA: Diagnosis not present

## 2018-08-04 DIAGNOSIS — Z794 Long term (current) use of insulin: Secondary | ICD-10-CM | POA: Insufficient documentation

## 2018-08-04 DIAGNOSIS — Z96652 Presence of left artificial knee joint: Secondary | ICD-10-CM | POA: Diagnosis not present

## 2018-08-04 DIAGNOSIS — K5900 Constipation, unspecified: Secondary | ICD-10-CM

## 2018-08-04 DIAGNOSIS — J449 Chronic obstructive pulmonary disease, unspecified: Secondary | ICD-10-CM | POA: Insufficient documentation

## 2018-08-04 DIAGNOSIS — I130 Hypertensive heart and chronic kidney disease with heart failure and stage 1 through stage 4 chronic kidney disease, or unspecified chronic kidney disease: Secondary | ICD-10-CM | POA: Diagnosis not present

## 2018-08-04 DIAGNOSIS — G4730 Sleep apnea, unspecified: Secondary | ICD-10-CM | POA: Insufficient documentation

## 2018-08-04 DIAGNOSIS — Z86010 Personal history of colonic polyps: Secondary | ICD-10-CM | POA: Diagnosis not present

## 2018-08-04 DIAGNOSIS — I509 Heart failure, unspecified: Secondary | ICD-10-CM | POA: Insufficient documentation

## 2018-08-04 DIAGNOSIS — I2510 Atherosclerotic heart disease of native coronary artery without angina pectoris: Secondary | ICD-10-CM | POA: Insufficient documentation

## 2018-08-04 DIAGNOSIS — Z87891 Personal history of nicotine dependence: Secondary | ICD-10-CM | POA: Diagnosis not present

## 2018-08-04 DIAGNOSIS — Z79810 Long term (current) use of selective estrogen receptor modulators (SERMs): Secondary | ICD-10-CM | POA: Insufficient documentation

## 2018-08-04 DIAGNOSIS — N183 Chronic kidney disease, stage 3 (moderate): Secondary | ICD-10-CM | POA: Insufficient documentation

## 2018-08-04 DIAGNOSIS — Z1211 Encounter for screening for malignant neoplasm of colon: Secondary | ICD-10-CM | POA: Insufficient documentation

## 2018-08-04 HISTORY — DX: Dyspnea, unspecified: R06.00

## 2018-08-04 HISTORY — PX: COLONOSCOPY WITH PROPOFOL: SHX5780

## 2018-08-04 LAB — GLUCOSE, CAPILLARY: Glucose-Capillary: 249 mg/dL — ABNORMAL HIGH (ref 70–99)

## 2018-08-04 SURGERY — COLONOSCOPY WITH PROPOFOL
Anesthesia: Monitor Anesthesia Care

## 2018-08-04 MED ORDER — HYDRALAZINE HCL 20 MG/ML IJ SOLN
INTRAMUSCULAR | Status: AC
  Filled 2018-08-04: qty 1

## 2018-08-04 MED ORDER — PROPOFOL 10 MG/ML IV BOLUS
INTRAVENOUS | Status: AC
  Filled 2018-08-04: qty 40

## 2018-08-04 MED ORDER — PROPOFOL 500 MG/50ML IV EMUL
INTRAVENOUS | Status: DC | PRN
  Administered 2018-08-04: 125 ug/kg/min via INTRAVENOUS

## 2018-08-04 MED ORDER — HYDRALAZINE HCL 20 MG/ML IJ SOLN
10.0000 mg | Freq: Once | INTRAMUSCULAR | Status: AC
  Administered 2018-08-04: 10 mg via INTRAVENOUS

## 2018-08-04 MED ORDER — SODIUM CHLORIDE 0.9 % IV SOLN
INTRAVENOUS | Status: DC
  Administered 2018-08-04: 10:00:00 via INTRAVENOUS

## 2018-08-04 MED ORDER — PROPOFOL 10 MG/ML IV BOLUS
INTRAVENOUS | Status: DC | PRN
  Administered 2018-08-04: 10 mg via INTRAVENOUS
  Administered 2018-08-04 (×2): 20 mg via INTRAVENOUS

## 2018-08-04 SURGICAL SUPPLY — 22 items

## 2018-08-04 NOTE — Anesthesia Preprocedure Evaluation (Signed)
Anesthesia Evaluation  Patient identified by MRN, date of birth, ID band Patient awake    Reviewed: Allergy & Precautions, NPO status   Airway Mallampati: II  TM Distance: >3 FB Neck ROM: Full    Dental  (+) Edentulous Upper, Poor Dentition, Missing   Pulmonary shortness of breath, asthma , sleep apnea , COPD, former smoker,    breath sounds clear to auscultation       Cardiovascular hypertension, + CAD and +CHF   Rhythm:Regular Rate:Normal + Systolic murmurs    Neuro/Psych Seizures -,   Neuromuscular disease CVA    GI/Hepatic hiatal hernia, GERD  ,  Endo/Other  diabetesMorbid obesity  Renal/GU Renal disease     Musculoskeletal  (+) Arthritis ,   Abdominal (+) + obese,   Peds  Hematology   Anesthesia Other Findings   Reproductive/Obstetrics                             Anesthesia Physical  Anesthesia Plan  ASA: IV  Anesthesia Plan: MAC   Post-op Pain Management:    Induction: Intravenous  PONV Risk Score and Plan: 2 and Ondansetron, Midazolam and Treatment may vary due to age or medical condition  Airway Management Planned: Natural Airway and Nasal Cannula  Additional Equipment:   Intra-op Plan:   Post-operative Plan:   Informed Consent: I have reviewed the patients History and Physical, chart, labs and discussed the procedure including the risks, benefits and alternatives for the proposed anesthesia with the patient or authorized representative who has indicated his/her understanding and acceptance.     Dental advisory given  Plan Discussed with: CRNA and Surgeon  Anesthesia Plan Comments:         Anesthesia Quick Evaluation

## 2018-08-04 NOTE — Anesthesia Procedure Notes (Signed)
Procedure Name: MAC Date/Time: 08/04/2018 11:04 AM Performed by: Niel Hummer, CRNA Pre-anesthesia Checklist: Patient identified, Suction available, Patient being monitored and Emergency Drugs available Patient Re-evaluated:Patient Re-evaluated prior to induction Oxygen Delivery Method: Simple face mask

## 2018-08-04 NOTE — Anesthesia Postprocedure Evaluation (Signed)
Anesthesia Post Note  Patient: Joann Gutierrez  Procedure(s) Performed: COLONOSCOPY WITH PROPOFOL (N/A )     Patient location during evaluation: Endoscopy Anesthesia Type: MAC Level of consciousness: awake and alert Pain management: pain level controlled Vital Signs Assessment: post-procedure vital signs reviewed and stable Respiratory status: spontaneous breathing, nonlabored ventilation and respiratory function stable Cardiovascular status: stable and blood pressure returned to baseline Postop Assessment: no apparent nausea or vomiting Anesthetic complications: no    Last Vitals:  Vitals:   08/04/18 1140 08/04/18 1150  BP: (!) 173/69 (!) 200/85  Pulse: 77 74  Resp: (!) 24 (!) 22  Temp:    SpO2: 98% 96%    Last Pain:  Vitals:   08/04/18 1150  TempSrc:   PainSc: 0-No pain                 Lynda Rainwater

## 2018-08-04 NOTE — H&P (Signed)
HPI :  73 y/o female with a history of oxygen dependant COPD, history of colon polyps, multiple adenomas removed > 3 years ago, largest > 1cm, here for surveillance colonoscopy. She states respiratory status is stable, otherwise feeling well.     Past Medical History:  Diagnosis Date  . Adenomatous colon polyp   . Allergy   . Anemia   . Asthma       . CAD (coronary artery disease)    Mild very minimal coronary disease with 20% obtuse marginal stenosis  . Carpal tunnel syndrome on left   . Cataract   . CHF (congestive heart failure) ([place])   . Chronic kidney disease (CKD), stage III (moderate) (HCC)   . COPD (chronic obstructive pulmonary disease) ([place])   . Depression   . 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 Diverticulosis   . Dyspnea   . 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 Esophageal dysmotility   . Esophageal stricture   . Gastritis   . Gastroparesis 08/21/2007  . GERD (gastroesophageal reflux disease)   . Hearing loss of both ears   . Hernia, hiatal   . Hyperlipidemia   . Hypertension   . Morbid obesity ([place])   . OSTEOARTHRITIS 08/09/2006  . Osteoporosis   . Oxygen deficiency    prn   . 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 RENAL INSUFFICIENCY 02/16/2009  . 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 Seizures ([place])    pt thinks it has been several monthes since she had a seisure  . Shingles   . Sickle cell trait ([place])   . Sleep apnea    Does not use CPAP - pateint states her cpap is broken  . Stroke [facility])      Past Surgical History:  Procedure Laterality Date  . ABDOMINAL HYSTERECTOMY    . ARTERY BIOPSY  01/07/2011   Procedure: MINOR BIOPSY TEMPORAL ARTERY;  Surgeon: Haywood Lasso, MD;  Location: [place];  Service: General;  Laterality: Left;  left temporal artery biopsy  . bil foot surgery    . BREAST LUMPECTOMY     benign  . BREAST LUMPECTOMY     both breast lumps removed   . CATARACT EXTRACTION Left 06/2016   Dr. Read Drivers  . COLONOSCOPY    . COLONOSCOPY WITH PROPOFOL N/A 07/05/2015   Procedure: COLONOSCOPY WITH PROPOFOL;  Surgeon: Manus Gunning, MD;  Location: [facility];  Service: Gastroenterology;  Laterality: N/A;  . ERD  08/08/2000  . ESOPHAGEAL MANOMETRY N/A 03/13/2015   Procedure: ESOPHAGEAL MANOMETRY (EM);  Surgeon: Manus Gunning, MD;  Location: [facility];  Service: Gastroenterology;  Laterality: N/A;  . ESOPHAGOGASTRODUODENOSCOPY  06/25/2006  . ESOPHAGOGASTRODUODENOSCOPY (EGD) WITH PROPOFOL N/A 07/05/2015   Procedure: ESOPHAGOGASTRODUODENOSCOPY (EGD) WITH PROPOFOL;  Surgeon: Manus Gunning, MD;  [facility];  Service: Gastroenterology;  Laterality: N/A;  . HERNIA REPAIR    . LEFT AND RIGHT HEART CATHETERIZATION WITH CORONARY ANGIOGRAM N/A 03/03/2013   Procedure: LEFT AND RIGHT HEART CATHETERIZATION WITH CORONARY ANGIOGRAM;  Surgeon: Minus Breeding, MD;  Location: [facility] CATH LAB;  Service: Cardiovascular;  Laterality: N/A;  . REPLACEMENT TOTAL KNEE Left 1998  . UPPER GASTROINTESTINAL ENDOSCOPY     Family History  Problem Relation Age of Onset  . Liver cancer Mother        Liver Cancer  . Diabetes Mother   . Kidney disease Mother   . Heart disease Mother  age 25's  . Heart disease Father 9       MI  . Heart attack Father        died of MI when pt was 28  . Sickle cell anemia Father   . Colon cancer Brother   . Cancer Brother        Colon Cancer  . Diabetes Sister   . Kidney disease Sister   . Heart disease Sister        age 24's  . Allergies Sister   . Diabetes Sister   . Kidney disease Sister   . Heart disease Sister        age 45's  . Esophageal cancer Neg Hx   . Rectal cancer Neg Hx   . Stomach cancer Neg Hx   . Amblyopia Neg Hx   . Blindness Neg Hx   . Glaucoma Neg Hx   . Macular degeneration Neg Hx   . Retinal detachment Neg Hx   . Cataracts Neg Hx   . Strabismus Neg Hx   . Retinitis pigmentosa Neg Hx    Social  History   Tobacco Use  . Smoking status: Former Smoker    Packs/day: 0.50    Years: 10.00    Pack years: 5.00    Quit date: 04/18/1980    Years since quitting: 38.3  . Smokeless tobacco: Never Used  Substance Use Topics  . Alcohol use: No    Alcohol/week: 0.0 standard drinks  . Drug use: No   Current Facility-Administered Medications  Medication Dose Route Frequency Provider Last Rate Last Dose  . 0.9 %  sodium chloride infusion   Intravenous Continuous Hadar Elgersma, Carlota Raspberry, MD       Allergies  Allergen Reactions  . Morphine And Related Other (See Comments)    Family request not to be given, reports pt does not wake up when given   . Promethazine Hcl Other (See Comments)    REACTION: lethargy     Review of Systems: All systems reviewed and negative except where noted in HPI.   Lab Results  Component Value Date   WBC 7.2 11/20/2017   HGB 9.4 (L) 07/27/2018   HCT 32.1 (L) 11/20/2017   MCV 71.8 (L) 11/20/2017   PLT 210.0 11/20/2017   Lab Results  Component Value Date   CREATININE 3.58 (H) 07/27/2018   BUN 44 (H) 07/27/2018   NA 142 07/27/2018   K 3.8 07/27/2018   CL 109 07/27/2018   CO2 23 07/27/2018    Lab Results  Component Value Date   ALT 8 09/05/2017   AST 14 (L) 09/05/2017   ALKPHOS 85 09/05/2017   BILITOT 0.9 09/05/2017      Physical Exam: Ht 5' 4.5" (1.638 m)   Wt 123.4 kg   BMI 45.98 kg/m  Constitutional: Pleasant,well-developed, female in no acute distress. HEENT: Normocephalic and atraumatic. Conjunctivae are normal. No scleral icterus. Neck supple.  Cardiovascular: Normal rate, regular rhythm.  Pulmonary/chest: Effort normal and breath sounds okay Abdominal: Soft, nondistended, nontender. There are no masses palpable.  ASSESSMENT AND PLAN: 73 y/o female here at the hospital for a surveillance colonoscopy for her oxygen dependant COPD and need for anesthesia. We have discussed risks / benefits of colonoscopy and anesthesia and she wishes  to proceed. Further recommendations pending the results.  Petrey Cellar, MD [facility] Gastroenterology

## 2018-08-04 NOTE — Op Note (Signed)
[facility] Patient Name: Joann Gutierrez Procedure Date: 08/04/2018 MRN: [id number] Attending MD: Carlota Raspberry. Havery Moros , MD Date of Birth: 05-23-1945 CSN: [id number] Age: 73 Admit Type: Inpatient Procedure:                Colonoscopy Indications:              High risk colon cancer surveillance: Personal                            history of adenoma (10 mm or greater in size)                            removed 3 years ago Providers:                Remo Lipps P. Havery Moros, MD, Cleda Daub, RN, Ladona Ridgel, Technician Referring MD:              Medicines:                Monitored Anesthesia Care Complications:            No immediate complications. Estimated blood loss:                            None. Estimated Blood Loss:     Estimated blood loss: none. Procedure:                Pre-Anesthesia Assessment:                           - Prior to the procedure, a History and Physical                            was performed, and patient medications and                            allergies were reviewed. The patient's tolerance of                            previous anesthesia was also reviewed. The risks                            and benefits of the procedure and the sedation                            options and risks were discussed with the patient.                            All questions were answered, and informed consent                            was obtained. Prior Anticoagulants: The patient has                            taken no  previous anticoagulant or antiplatelet                            agents. ASA Grade Assessment: III - A patient with                            severe systemic disease. After reviewing the risks                            and benefits, the patient was deemed in                            satisfactory condition to undergo the procedure.                           After obtaining informed consent, the  colonoscope                            was passed under direct vision. Throughout the                            procedure, the patient's blood pressure, pulse, and                            oxygen saturations were monitored continuously. The                            CF-HQ190L ([id number]) Olympus colonoscope was                            introduced through the anus and advanced to the the                            cecum, identified by appendiceal orifice and                            ileocecal valve. The colonoscopy was performed                            without difficulty. The patient tolerated the                            procedure well. The quality of the bowel                            preparation was good. The ileocecal valve,                            appendiceal orifice, and rectum were photographed. Scope In: 11:09:31 AM Scope Out: 11:25:44 AM Scope Withdrawal Time: 0 hours 11 minutes 35 seconds  Total Procedure Duration: 0 hours 16 minutes 13 seconds  Findings:      The perianal and digital rectal examinations were normal.      Scattered medium-mouthed diverticula were found in the entire colon.  The exam was otherwise without abnormality. No polyps. Impression:               - Diverticulosis in the entire examined colon.                           - The examination was otherwise normal.                           - No specimens collected. Moderate Sedation:      No moderate sedation, case performed with MAC Recommendation:           - Patient has a contact number available for                            emergencies. The signs and symptoms of potential                            delayed complications were discussed with the                            patient. Return to normal activities tomorrow.                            Written discharge instructions were provided to the                            patient.                           - Resume previous diet.                            - Continue present medications.                           - Consideration for repeat colonoscopy in 5 years                            for surveillance. Patient can see [place] in clinic at                            that time to discuss if any further exams should be                            done. I don't feel strongly she needs additional                            exams given age at that time and other                            comorbidities. Procedure Code(s):        --- Professional ---                           [id number], Colonoscopy, flexible; diagnostic, including  collection of specimen(s) by brushing or washing,                            when performed (separate procedure) Diagnosis Code(s):        --- Professional ---                           Z86.010, Personal history of colonic polyps                           K57.30, Diverticulosis of large intestine without                            perforation or abscess without bleeding CPT copyright 2019 American Medical Association. All rights reserved. The codes documented in this report are preliminary and upon coder review may  be revised to meet current compliance requirements. Remo Lipps P. Armbruster, MD 08/04/2018 11:31:55 AM This report has been signed electronically. Number of Addenda: 0

## 2018-08-04 NOTE — Interval H&P Note (Signed)
History and Physical Interval Note:  08/04/2018 10:09 AM  Joann Gutierrez  has presented today for surgery, with the diagnosis of polyps and chronic constipation.  The various methods of treatment have been discussed with the patient and family. After consideration of risks, benefits and other options for treatment, the patient has consented to  Procedure(s): COLONOSCOPY WITH PROPOFOL (N/A) as a surgical intervention.  The patient's history has been reviewed, patient examined, no change in status, stable for surgery.  I have reviewed the patient's chart and labs.  Questions were answered to the patient's satisfaction.     [place]

## 2018-08-04 NOTE — Discharge Instructions (Signed)

## 2018-08-04 NOTE — Transfer of Care (Signed)
Immediate Anesthesia Transfer of Care Note  Patient: Joann Gutierrez  Procedure(s) Performed: COLONOSCOPY WITH PROPOFOL (N/A )  Patient Location: PACU  Anesthesia Type:MAC  Level of Consciousness: awake, alert  and oriented  Airway & Oxygen Therapy: Patient Spontanous Breathing and Patient connected to face mask oxygen  Post-op Assessment: Report given to RN and Post -op Vital signs reviewed and stable  Post vital signs: Reviewed and stable  Last Vitals:  Vitals Value Taken Time  BP    Temp    Pulse 71 08/04/18 1133  Resp 22 08/04/18 1133  SpO2 100 % 08/04/18 1133  Vitals shown include unvalidated device data.  Last Pain:  Vitals:   08/04/18 1017  TempSrc: Oral  PainSc: 0-No pain         Complications: No apparent anesthesia complications

## 2018-08-05 ENCOUNTER — Telehealth: Payer: Self-pay | Admitting: Pulmonary Disease

## 2018-08-05 ENCOUNTER — Encounter (HOSPITAL_COMMUNITY): Payer: Self-pay | Admitting: Gastroenterology

## 2018-08-05 NOTE — Telephone Encounter (Signed)
Pt's OV notes have been faxed to [facility] in [facility] attn. Nothing further needed.

## 2018-08-10 ENCOUNTER — Other Ambulatory Visit: Payer: Self-pay

## 2018-08-10 ENCOUNTER — Ambulatory Visit (HOSPITAL_COMMUNITY)
Admission: RE | Admit: 2018-08-10 | Discharge: 2018-08-10 | Disposition: A | Discharge status: Home / Self Care | Payer: Medicare Other | Source: Ambulatory Visit | Attending: Nephrology | Admitting: Nephrology

## 2018-08-10 VITALS — BP 166/87 | HR 97 | Temp 97.6°F | Resp 20

## 2018-08-10 DIAGNOSIS — N183 Chronic kidney disease, stage 3 unspecified: Secondary | ICD-10-CM

## 2018-08-10 LAB — POCT HEMOGLOBIN-HEMACUE: Hemoglobin: 9 g/dL — ABNORMAL LOW (ref 12.0–15.0)

## 2018-08-10 MED ORDER — EPOETIN ALFA 10000 UNIT/ML IJ SOLN
INTRAMUSCULAR | Status: AC
  Filled 2018-08-10: qty 1

## 2018-08-10 MED ORDER — EPOETIN ALFA 20000 UNIT/ML IJ SOLN
INTRAMUSCULAR | Status: AC
  Filled 2018-08-10: qty 1

## 2018-08-10 MED ORDER — EPOETIN ALFA 10000 UNIT/ML IJ SOLN
30000.0000 [IU] | INTRAMUSCULAR | Status: DC
  Administered 2018-08-10: 30000 [IU] via SUBCUTANEOUS

## 2018-08-11 MED FILL — Epoetin Alfa Inj 20000 Unit/ML: INTRAMUSCULAR | Qty: 1 | Status: AC

## 2018-08-11 MED FILL — Epoetin Alfa Inj 10000 Unit/ML: INTRAMUSCULAR | Qty: 1 | Status: AC

## 2018-08-21 ENCOUNTER — Other Ambulatory Visit: Payer: Self-pay

## 2018-08-24 ENCOUNTER — Ambulatory Visit (HOSPITAL_COMMUNITY)
Admission: RE | Admit: 2018-08-24 | Discharge: 2018-08-24 | Disposition: A | Discharge status: Home / Self Care | Payer: Medicare Other | Source: Ambulatory Visit | Attending: Nephrology | Admitting: Nephrology

## 2018-08-24 ENCOUNTER — Other Ambulatory Visit: Payer: Self-pay

## 2018-08-24 VITALS — BP 178/91 | HR 96 | Temp 96.6°F | Resp 20

## 2018-08-24 DIAGNOSIS — N183 Chronic kidney disease, stage 3 unspecified: Secondary | ICD-10-CM

## 2018-08-24 DIAGNOSIS — D631 Anemia in chronic kidney disease: Secondary | ICD-10-CM | POA: Insufficient documentation

## 2018-08-24 LAB — FERRITIN: Ferritin: 412 ng/mL — ABNORMAL HIGH (ref 11–307)

## 2018-08-24 LAB — RENAL FUNCTION PANEL
Albumin: 3 g/dL — ABNORMAL LOW (ref 3.5–5.0)
Anion gap: 8 (ref 5–15)
BUN: 24 mg/dL — ABNORMAL HIGH (ref 8–23)
CO2: 23 mmol/L (ref 22–32)
Calcium: 8.5 mg/dL — ABNORMAL LOW (ref 8.9–10.3)
Chloride: 111 mmol/L (ref 98–111)
Creatinine, Ser: 3.06 mg/dL — ABNORMAL HIGH (ref 0.44–1.00)
GFR calc Af Amer: 17 mL/min — ABNORMAL LOW (ref >60)
GFR calc non Af Amer: 14 mL/min — ABNORMAL LOW (ref >60)
Glucose, Bld: 211 mg/dL — ABNORMAL HIGH (ref 70–99)
Phosphorus: 3.7 mg/dL (ref 2.5–4.6)
Potassium: 3.6 mmol/L (ref 3.5–5.1)
Sodium: 142 mmol/L (ref 135–145)

## 2018-08-24 LAB — IRON AND TIBC
Iron: 103 ug/dL (ref 28–170)
Saturation Ratios: 57 % — ABNORMAL HIGH (ref 10.4–31.8)
TIBC: 182 ug/dL — ABNORMAL LOW (ref 250–450)
UIBC: 79 ug/dL

## 2018-08-24 LAB — POCT HEMOGLOBIN-HEMACUE: Hemoglobin: 9.2 g/dL — ABNORMAL LOW (ref 12.0–15.0)

## 2018-08-24 MED ORDER — EPOETIN ALFA 20000 UNIT/ML IJ SOLN
INTRAMUSCULAR | Status: AC
  Filled 2018-08-24: qty 1

## 2018-08-24 MED ORDER — EPOETIN ALFA 40000 UNIT/ML IJ SOLN
30000.0000 [IU] | INTRAMUSCULAR | Status: DC
  Administered 2018-08-24: 30000 [IU] via SUBCUTANEOUS

## 2018-08-24 MED ORDER — EPOETIN ALFA 10000 UNIT/ML IJ SOLN
INTRAMUSCULAR | Status: AC
  Filled 2018-08-24: qty 1

## 2018-08-25 LAB — PTH, INTACT AND CALCIUM
Calcium, Total (PTH): 8.5 mg/dL — ABNORMAL LOW (ref 8.7–10.3)
PTH: 71 pg/mL — ABNORMAL HIGH (ref 15–65)

## 2018-08-25 MED FILL — Epoetin Alfa Inj 10000 Unit/ML: INTRAMUSCULAR | Qty: 1 | Status: AC

## 2018-08-25 MED FILL — Epoetin Alfa Inj 20000 Unit/ML: INTRAMUSCULAR | Qty: 1 | Status: AC

## 2018-09-07 ENCOUNTER — Ambulatory Visit (HOSPITAL_COMMUNITY)
Admission: RE | Admit: 2018-09-07 | Discharge: 2018-09-07 | Disposition: A | Discharge status: Home / Self Care | Payer: Medicare Other | Source: Ambulatory Visit | Attending: Nephrology | Admitting: Nephrology

## 2018-09-07 VITALS — BP 147/81 | HR 72 | Temp 97.2°F | Resp 20

## 2018-09-07 DIAGNOSIS — N183 Chronic kidney disease, stage 3 unspecified: Secondary | ICD-10-CM

## 2018-09-07 LAB — POCT HEMOGLOBIN-HEMACUE: Hemoglobin: 9.2 g/dL — ABNORMAL LOW (ref 12.0–15.0)

## 2018-09-07 MED ORDER — EPOETIN ALFA 10000 UNIT/ML IJ SOLN
INTRAMUSCULAR | Status: AC
  Administered 2018-09-07: 13:00:00 10000 [IU] via SUBCUTANEOUS
  Filled 2018-09-07: qty 1

## 2018-09-07 MED ORDER — EPOETIN ALFA 20000 UNIT/ML IJ SOLN
INTRAMUSCULAR | Status: AC
  Administered 2018-09-07: 20000 [IU] via SUBCUTANEOUS
  Filled 2018-09-07: qty 1

## 2018-09-07 MED ORDER — EPOETIN ALFA 40000 UNIT/ML IJ SOLN: 30000.0000 [IU] | INTRAMUSCULAR | Status: DC

## 2018-09-09 NOTE — Patient Outreach (Signed)
[facility]) Care Management  06/03/2018 Late entry  Joann Gutierrez 02-10-1945 [id number]   [place] attempted follow up outreach call to patient.  Patient was unavailable. HIPPA compliance voicemail message left with return callback number.  Plan: RN will call patient again within 30 days.  [place] Care Management [phone]

## 2018-09-14 ENCOUNTER — Other Ambulatory Visit: Payer: Self-pay | Admitting: *Deleted

## 2018-09-14 NOTE — Patient Outreach (Signed)
[place] [facility]) Care Management  09/14/2018  DANIALLE DEMENT 1945/03/15 [id number]  RN Health Coach attempted follow up outreach call to patient.  RN spoke with [name] the caregiver. Per teddy he is not at home and requested a call back after lunch tomorrow.  [place] Management [phone]  Plan: RN will call patient again.  [place] Care Management [phone]

## 2018-09-15 ENCOUNTER — Other Ambulatory Visit: Payer: Self-pay | Admitting: *Deleted

## 2018-09-15 DIAGNOSIS — N184 Chronic kidney disease, stage 4 (severe): Secondary | ICD-10-CM | POA: Diagnosis not present

## 2018-09-15 DIAGNOSIS — I129 Hypertensive chronic kidney disease with stage 1 through stage 4 chronic kidney disease, or unspecified chronic kidney disease: Secondary | ICD-10-CM | POA: Diagnosis not present

## 2018-09-15 DIAGNOSIS — E889 Metabolic disorder, unspecified: Secondary | ICD-10-CM | POA: Diagnosis not present

## 2018-09-15 DIAGNOSIS — M9080 Osteopathy in diseases classified elsewhere, unspecified site: Secondary | ICD-10-CM | POA: Diagnosis not present

## 2018-09-15 DIAGNOSIS — D631 Anemia in chronic kidney disease: Secondary | ICD-10-CM | POA: Diagnosis not present

## 2018-09-15 NOTE — Patient Outreach (Signed)
[place] [facility]) Care Management  09/15/2018  Joann Gutierrez 1945/09/14 [id number]  RN Health Coach telephone call to patient.  Hipaa compliance verified. Per [name] caregiver he is out away from the home and asked if I could call back tomorrow.  [place] Care Management [phone]

## 2018-09-16 ENCOUNTER — Other Ambulatory Visit: Payer: Self-pay | Admitting: *Deleted

## 2018-09-21 ENCOUNTER — Ambulatory Visit (HOSPITAL_COMMUNITY)
Admission: RE | Admit: 2018-09-21 | Discharge: 2018-09-21 | Disposition: A | Discharge status: Home / Self Care | Payer: Medicare Other | Source: Ambulatory Visit | Attending: Nephrology | Admitting: Nephrology

## 2018-09-21 ENCOUNTER — Other Ambulatory Visit: Payer: Self-pay

## 2018-09-21 VITALS — BP 150/71 | HR 70 | Temp 98.0°F | Resp 20

## 2018-09-21 DIAGNOSIS — N183 Chronic kidney disease, stage 3 unspecified: Secondary | ICD-10-CM

## 2018-09-21 LAB — POCT HEMOGLOBIN-HEMACUE: Hemoglobin: 9.4 g/dL — ABNORMAL LOW (ref 12.0–15.0)

## 2018-09-21 MED ORDER — EPOETIN ALFA 10000 UNIT/ML IJ SOLN: 30000.0000 [IU] | INTRAMUSCULAR | Status: DC

## 2018-09-21 MED ORDER — EPOETIN ALFA 20000 UNIT/ML IJ SOLN
INTRAMUSCULAR | Status: AC
  Administered 2018-09-21: 20000 [IU] via SUBCUTANEOUS
  Filled 2018-09-21: qty 1

## 2018-09-21 MED ORDER — EPOETIN ALFA 10000 UNIT/ML IJ SOLN
INTRAMUSCULAR | Status: AC
  Administered 2018-09-21: 10000 [IU] via SUBCUTANEOUS
  Filled 2018-09-21: qty 1

## 2018-09-24 ENCOUNTER — Other Ambulatory Visit: Payer: Self-pay

## 2018-09-24 NOTE — Patient Outreach (Addendum)
[place] [name]) Care Management  09/16/2018 Late entry  Joann Gutierrez Apr 23, 1945 [id number]  [place] third attempt telephone call to patient caregiver [name].  Hipaa compliance verified. Per [name] he does not know what the patient blood sugar is. RN discussed what the patient A1C is 11.5. RN discussed that the A1C is high and that the patient has chronic kidney disease stage 4. RN discussed that if the blood sugar is not controlled that the patient kidney disease can progress to dialysis. RN discussed healthy eating and snacks. Per [name] stated that the patient craves sugar and that her sister brings her sweets. RN discussed with [name] as his caregiver he needs to discuss this with the sister on patient adhering to her diet. Per [name] the patient mostly lays around on the couch and is weak. [name] stated that the patient needs some type of therapy and was asking about some one to come in and check or help with bathing. [name] has agreed to further outreach calls.    Current Medications:  Current Outpatient Medications  Medication Sig Dispense Refill  . albuterol (PROVENTIL HFA;VENTOLIN HFA) 108 (90 Base) MCG/ACT inhaler Inhale 2 puffs into the lungs every 6 (six) hours as needed for wheezing or shortness of breath. 1 Inhaler 6  . amLODipine (NORVASC) 2.5 MG tablet Take 2.5 mg by mouth daily.    Marland Kitchen aspirin EC 81 MG tablet Take 81 mg by mouth daily.    Marland Kitchen azelastine (ASTELIN) 0.1 % nasal spray Place 1 spray into both nostrils 2 (two) times daily as needed (allergies). Use in each nostril as directed     . bisoprolol (ZEBETA) 5 MG tablet Take 1 tablet (5 mg total) by mouth 2 (two) times daily. (Patient taking differently: Take 5 mg by mouth daily. ) 60 tablet 2  . donepezil (ARICEPT) 10 MG tablet Take 10 mg by mouth at bedtime.    Marland Kitchen ezetimibe (ZETIA) 10 MG tablet Take 10 mg by mouth daily.     . furosemide (LASIX) 80 MG tablet Take 80 mg by mouth 2 (two) times daily.    Marland Kitchen  gabapentin (NEURONTIN) 100 MG capsule Take 1 capsule (100 mg total) by mouth 2 (two) times daily.    . hydrALAZINE (APRESOLINE) 100 MG tablet Take 100 mg by mouth 3 (three) times daily.     . insulin aspart (NOVOLOG) 100 UNIT/ML injection Inject 5 Units into the skin 3 (three) times daily with meals. (Patient taking differently: Inject 0-15 Units into the skin 3 (three) times daily with meals. )    . isosorbide mononitrate (IMDUR) 120 MG 24 hr tablet isosorbide mononitrate ER 120 mg tablet,extended release 24 hr    . ketorolac (ACULAR) 0.4 % SOLN Place 1 drop into the left eye 2 (two) times a day.    Marland Kitchen LEVEMIR FLEXTOUCH 100 UNIT/ML Pen Inject 18 Units into the skin daily. (Patient taking differently: Inject 30 Units into the skin daily. )    . losartan (COZAAR) 100 MG tablet Take 100 mg by mouth daily.    . montelukast (SINGULAIR) 10 MG tablet Take 1 tablet (10 mg total) by mouth daily. (Patient taking differently: Take 10 mg by mouth at bedtime. ) 30 tablet 3  . nitroGLYCERIN (NITROSTAT) 0.4 MG SL tablet PLACE ONE TABLET UNDER THE TONGUE EVERY FIVE MINUTES AS NEEDED FOR CHEST PAIN 25 tablet 6  . NOVOLOG FLEXPEN 100 UNIT/ML FlexPen INJECT 5 UNITS SUBCUTANEOUSLY 3 TIMES DAILY AS DIRECTED SLIDING SCALE    .  omeprazole (PRILOSEC) 20 MG capsule Take 20 mg by mouth 2 (two) times a day.    Marland Kitchen omeprazole (PRILOSEC) 40 MG capsule Take 1 capsule (40 mg total) by mouth 2 (two) times a day. (Patient not taking: Reported on 07/29/2018) 180 capsule 1  . ondansetron (ZOFRAN ODT) 4 MG disintegrating tablet Take 1 tablet (4 mg total) by mouth every 8 (eight) hours as needed for nausea or vomiting. (Patient not taking: Reported on 07/28/2018) 30 tablet 3  . OXYGEN Inhale 2 L into the lungs as needed. CPAP with oxygen at bedtime     . polyethylene glycol (MIRALAX) 17 g packet Take 17 g by mouth daily as needed. (Patient taking differently: Take 17 g by mouth daily as needed (constipation.). ) 14 each 0  . raloxifene  (EVISTA) 60 MG tablet Take 60 mg by mouth every evening.     . TRELEGY ELLIPTA 100-62.5-25 MCG/INH AEPB Inhale 1 puff into the lungs daily. 60 each 1  . TRUEPLUS PEN NEEDLES 32G X 4 MM MISC INJECT THREE TIMES A DAY AS DIRECTED  1  . Vitamin D, Ergocalciferol, (DRISDOL) 50000 UNITS CAPS capsule Take 50,000 Units by mouth every Friday.      No current facility-administered medications for this visit.     Functional Status:  In your present state of health, do you have any difficulty performing the following activities: 09/16/2018 04/07/2018  Hearing? N N  Vision? Y Y  Comment - Glaucoma  Difficulty concentrating or making decisions? Y Y  Comment - short term memory loss/Patient needs constant reminding  Walking or climbing stairs? Y Y  Comment - cardiac, obesity  Dressing or bathing? N N  Doing errands, shopping? [name]  Comment - caregiver takes her  Conservation officer, nature and eating ? [name]  Comment - caregiver assists  Using the Toilet? N N  In the past six months, have you accidently leaked urine? Y Y  Do you have problems with loss of bowel control? N N  Managing your Medications? Y Y  Comment - cargiver arranges  Managing your Finances? [name]  Comment - caregiver Estate manager/land agent or managing your Housekeeping? Y Y  Comment - caregiver handles  Some recent data might be hidden    Fall/Depression Screening: Fall Risk  09/16/2018 04/07/2018 10/20/2017  Falls in the past year? 1 1 Yes  Number falls in past yr: 1 1 2  or more  Injury with Fall? 0 0 No  Risk Factor Category  - - High Fall Risk  Risk for fall due to : - History of fall(s);Impaired balance/gait;Impaired mobility History of fall(s)  Follow up - Falls evaluation completed;Falls prevention discussed Education provided;Falls prevention discussed  Comment - - -   PHQ 2/9 Scores 09/16/2018 04/07/2018 10/20/2017 06/27/2017 12/21/2015 12/11/2015 12/06/2015  PHQ - 2 Score 0 0 0 0 0 1 0  PHQ- 9 Score - - - - - - -  Exception  Documentation - - - - - - -   THN CM Care Plan Problem One     Most Recent Value  Care Plan Problem One  Knowledge Deficit in Self management of Diabetes  Role Documenting the Problem One  [place] for Problem One  Active  THN Long Term Goal   Patient will see a decrease in A1C from 11.5 within the next 90 days  Interventions for Problem One Long Term Goal  Rn discussed with caregiver about the patient elevated A1C. RN reiteraterated  eating healthy. RN reiterated that the patient needs to check blood sugar. RN will follow up for further discussion  THN CM Short Term Goal #1   Caregiver will have better understanding of healthy snacks to help patient to prepare within the next 30 days  Interventions for Short Term Goal #1  RN discussed with the cargiver the importance of healthy snacks. Caregiver stated that patient sister is bringing herr sweets. RN discussed the long term effects of uncontrolled blood sugars. Patient has stage 4 kidney discease. RN discussed that if not controlled she could go to stage 5 dialysis. N will follow upfor further discussion  THN CM Short Term Goal #2   Patient caregiver will verbalize having a better understanding of fast food choices he can pick up from take out menus within the next 30 days  Interventions for Short Term Goal #2  RN reiterates the healthy food choices importance. RN discussed with [name] as Caregiver how he needs to monitor her food closely to help her make better choices. RN will follow up with further discussion  THN CM Short Term Goal #3  Patient and caregiver will monitor blood sugars more closely within the next 30 days  THN CM Short Term Goal #3 Start Date  09/16/18  Interventions for Short Tern Goal #3  RN discusssed with caregiver about what the patient blood sugar was. Varegiver didnot know. Patient states she took it but can't remember when talking with her. RN reiterated to the caregiver the importance of checking and monitoring  her blood sugars. RN will follow up with further discussion.       Assessment:  A1C 11.5 Caregiver does not know what the patient blood sugar are Patient can't remember what her blood sugars are.states she took it Patient is not exercising Patient is on continuous O2 Patient will continue to benefit from [place] telephonic outreach for education and support for diabetes self management.  Plan:  Referral to Social worker Referral to Complex case management RN reiterated checking blood sugars as per Dr order RN discussed healthy eating with [name] caregiver and monitoring her diet RN sent this assessment to PCP

## 2018-09-24 NOTE — Patient Outreach (Signed)
[place] [facility]) Care Management  09/24/2018  EARLE BURSON September 03, 1945 [id number]   Social work referral received from [place], Nat Christen, to contact caregiver regarding resources for in-home support and possibly placement. Successful outreach to caregiver, Clint Guy, today.  [name] is primary caregiver for patient and expressed that it's taking a toll on him due to his own medical issues.  Discussed with him that in-home aide services are not covered by Medicare and he reports that patient does not qualify for Medicaid.  Unfortunately, aide services would have to be privately paid for but caregiver stated that patient cannot afford this.  Discussed possible placement as she may potentially qualify for long term care Medicaid but caregiver stated that patient is not open to this.  Informed caregiver of In-Home program through [place], however, wait list is approximately one year long.  He did request that referral be submitted.   Left voicemail message for [name] with DSS.  Will provide referral information when return call is received.     [name], BSW Social Worker [phone]

## 2018-09-25 ENCOUNTER — Other Ambulatory Visit: Payer: Self-pay | Admitting: *Deleted

## 2018-09-25 NOTE — Patient Outreach (Signed)
[place] [facility]) Care Management  09/25/2018  Joann Gutierrez 1945/03/20 [id number]   Referral received from health coach as member's caregiver has reported difficulty managing her healthcare.  Report member has decreased strength and not not managing diabetes well, requesting physical therapy and assistance with bathing.  Per chart, member also has history of HTN, asthma, GERD, and CKD.  Call placed to caregiver Joann Gutierrez, no answer.  HIPAA compliant voice message left, unsuccessful outreach letter sent.  Will follow up within the next 3-4 business days.  Joann Gutierrez, [place], MSN [place] [phone]

## 2018-10-01 ENCOUNTER — Other Ambulatory Visit: Payer: Self-pay | Admitting: *Deleted

## 2018-10-01 NOTE — Patient Outreach (Signed)
[place] [facility]) Care Management  10/01/2018  Joann Gutierrez 08-21-1945 [id number]  [place] discipline closure. Patient needs more Complex case management.  [place] Care Management [phone]

## 2018-10-02 ENCOUNTER — Other Ambulatory Visit: Payer: Self-pay

## 2018-10-02 ENCOUNTER — Other Ambulatory Visit: Payer: Self-pay | Admitting: *Deleted

## 2018-10-02 NOTE — Patient Outreach (Signed)
[place] [facility]) Care Management  10/02/2018  Joann Gutierrez July 06, 1945 [id number]   Left second voicemail message for [name] at [place] requesting call back in order to place referral for In-Home Aide Program.   [name], [place] Worker [phone]

## 2018-10-02 NOTE — Patient Outreach (Signed)
[place] [facility]) Care Management  10/02/2018  DELLAMAE ROSAMILIA 12-01-1945 [id number]   Unsuccessful outreach #2.  Referral received from health coach as member's caregiver has reported difficulty managing her healthcare.  Report member has decreased strength and not not managing diabetes well, requesting physical therapy and assistance with bathing.  Per chart, member also has history of HTN, asthma, GERD, and CKD.  Call placed to caregiver [name], no answer.  HIPAA compliant voice message left.  Will follow up within the next 3-4 business days.  [name], [place], MSN [place] [phone]

## 2018-10-05 ENCOUNTER — Ambulatory Visit (HOSPITAL_COMMUNITY)
Admission: RE | Admit: 2018-10-05 | Discharge: 2018-10-05 | Disposition: A | Discharge status: Home / Self Care | Payer: Medicare Other | Source: Ambulatory Visit | Attending: Nephrology | Admitting: Nephrology

## 2018-10-05 ENCOUNTER — Other Ambulatory Visit: Payer: Self-pay

## 2018-10-05 VITALS — BP 167/88 | HR 74 | Temp 96.4°F | Resp 20

## 2018-10-05 DIAGNOSIS — D631 Anemia in chronic kidney disease: Secondary | ICD-10-CM | POA: Insufficient documentation

## 2018-10-05 DIAGNOSIS — N183 Chronic kidney disease, stage 3 unspecified: Secondary | ICD-10-CM

## 2018-10-05 LAB — RENAL FUNCTION PANEL
Albumin: 3 g/dL — ABNORMAL LOW (ref 3.5–5.0)
Anion gap: 9 (ref 5–15)
BUN: 31 mg/dL — ABNORMAL HIGH (ref 8–23)
CO2: 27 mmol/L (ref 22–32)
Calcium: 8.5 mg/dL — ABNORMAL LOW (ref 8.9–10.3)
Chloride: 106 mmol/L (ref 98–111)
Creatinine, Ser: 3.12 mg/dL — ABNORMAL HIGH (ref 0.44–1.00)
GFR calc Af Amer: 16 mL/min — ABNORMAL LOW (ref >60)
GFR calc non Af Amer: 14 mL/min — ABNORMAL LOW (ref >60)
Glucose, Bld: 215 mg/dL — ABNORMAL HIGH (ref 70–99)
Phosphorus: 4.1 mg/dL (ref 2.5–4.6)
Potassium: 4.3 mmol/L (ref 3.5–5.1)
Sodium: 142 mmol/L (ref 135–145)

## 2018-10-05 LAB — FERRITIN: Ferritin: 495 ng/mL — ABNORMAL HIGH (ref 11–307)

## 2018-10-05 LAB — IRON AND TIBC
Iron: 112 ug/dL (ref 28–170)
Saturation Ratios: 57 % — ABNORMAL HIGH (ref 10.4–31.8)
TIBC: 197 ug/dL — ABNORMAL LOW (ref 250–450)
UIBC: 85 ug/dL

## 2018-10-05 LAB — POCT HEMOGLOBIN-HEMACUE: Hemoglobin: 9.5 g/dL — ABNORMAL LOW (ref 12.0–15.0)

## 2018-10-05 MED ORDER — EPOETIN ALFA 10000 UNIT/ML IJ SOLN
INTRAMUSCULAR | Status: AC
  Administered 2018-10-05: 10000 [IU]
  Filled 2018-10-05: qty 1

## 2018-10-05 MED ORDER — EPOETIN ALFA 20000 UNIT/ML IJ SOLN
INTRAMUSCULAR | Status: AC
  Administered 2018-10-05: 20000 [IU]
  Filled 2018-10-05: qty 1

## 2018-10-05 MED ORDER — EPOETIN ALFA 40000 UNIT/ML IJ SOLN: 30000.0000 [IU] | INTRAMUSCULAR | Status: DC

## 2018-10-06 LAB — PTH, INTACT AND CALCIUM
Calcium, Total (PTH): 8.6 mg/dL — ABNORMAL LOW (ref 8.7–10.3)
PTH: 183 pg/mL — ABNORMAL HIGH (ref 15–65)

## 2018-10-08 ENCOUNTER — Other Ambulatory Visit: Payer: Self-pay | Admitting: *Deleted

## 2018-10-08 NOTE — Patient Outreach (Signed)
[place] [facility]) Care Management  10/08/2018  Joann Gutierrez Jun 05, 1945 [id number]   Third attempt made to contact member's caregiver for assessment of needs and engagement.  He report this is not a good time to talk as he is still in bed.  State he will call this care manager back once he is up.  Will await call back, if no call back, will make 4th and final attempt within the next 3-4 business days.  [name], [place], MSN [place] [phone]

## 2018-10-09 ENCOUNTER — Other Ambulatory Visit: Payer: Self-pay

## 2018-10-09 NOTE — Patient Outreach (Signed)
[place] [facility]) Care Management  10/09/2018  LASHANTI CHAMBLESS 04/28/1945 [id number]   Left third voicemail message for [name] at [place] requesting call back in order to place referral for In-Home Aide Program.   [name], [place] Worker [phone]

## 2018-10-14 ENCOUNTER — Other Ambulatory Visit: Payer: Self-pay | Admitting: *Deleted

## 2018-10-14 NOTE — Patient Outreach (Signed)
[place] [facility] [facility]) Care Management  10/14/2018  Joann Gutierrez 07-Jul-1945 [id number]   Fourth attempt made to contact member's caregiver [name] for assessment of needs.  No answer, HIPAA compliant voice message left.  If no call back by end of week will close case due to inability to establish contact.  [name], [place], MSN [place] [phone]

## 2018-10-16 ENCOUNTER — Other Ambulatory Visit: Payer: Self-pay | Admitting: *Deleted

## 2018-10-16 NOTE — Patient Outreach (Signed)
[place] [facility]) Care Management  10/16/2018  Joann Gutierrez 08-15-1945 [id number]   No response from member after multiple unsuccessful outreach attempts and letter sent.  Will close case at this time due to inability to maintain contact.  THN BSW remains active, will notify her of nursing case closure due to inability to contact.  [name], [place], MSN [place] [phone]

## 2018-10-19 ENCOUNTER — Other Ambulatory Visit: Payer: Self-pay

## 2018-10-19 ENCOUNTER — Ambulatory Visit (HOSPITAL_COMMUNITY)
Admission: RE | Admit: 2018-10-19 | Discharge: 2018-10-19 | Disposition: A | Discharge status: Home / Self Care | Payer: Medicare Other | Source: Ambulatory Visit | Attending: Nephrology | Admitting: Nephrology

## 2018-10-19 VITALS — BP 151/85 | HR 64 | Temp 97.3°F | Resp 20

## 2018-10-19 DIAGNOSIS — N183 Chronic kidney disease, stage 3 unspecified: Secondary | ICD-10-CM

## 2018-10-19 LAB — POCT HEMOGLOBIN-HEMACUE: Hemoglobin: 9.3 g/dL — ABNORMAL LOW (ref 12.0–15.0)

## 2018-10-19 MED ORDER — EPOETIN ALFA 40000 UNIT/ML IJ SOLN: 30000.0000 [IU] | INTRAMUSCULAR | Status: DC

## 2018-10-19 MED ORDER — EPOETIN ALFA 20000 UNIT/ML IJ SOLN
INTRAMUSCULAR | Status: AC
  Administered 2018-10-19: 20000 [IU] via SUBCUTANEOUS
  Filled 2018-10-19: qty 1

## 2018-10-19 MED ORDER — EPOETIN ALFA 10000 UNIT/ML IJ SOLN
INTRAMUSCULAR | Status: AC
  Administered 2018-10-19: 10000 [IU] via SUBCUTANEOUS
  Filled 2018-10-19: qty 1

## 2018-10-28 ENCOUNTER — Other Ambulatory Visit: Payer: Self-pay

## 2018-10-28 NOTE — Patient Outreach (Addendum)
[place] [facility]) Care Management  10/28/2018  Joann Gutierrez Mar 07, 1945 [id number]   Three voicemail messages have been left(9/3, 9/11, and 9/18) for Joann Gutierrez, DSS Social Worker, regarding adding patient to wait list for [employer].   Left message with DSS Social Worker, Joann Gutierrez, today.  Will contact program supervisor if no response.    Addendum: Received return call from Joann Gutierrez.  She provided contact information for Joann Gutierrez, DSS Social Worker, currently in the office. Was able to contact Joann Gutierrez and submit referral.  Wait list is approximately two years long.     Joann Gutierrez, BSW Social Worker [phone]

## 2018-10-30 DIAGNOSIS — H35033 Hypertensive retinopathy, bilateral: Secondary | ICD-10-CM | POA: Diagnosis not present

## 2018-10-30 DIAGNOSIS — H35363 Drusen (degenerative) of macula, bilateral: Secondary | ICD-10-CM | POA: Diagnosis not present

## 2018-10-30 DIAGNOSIS — H35373 Puckering of macula, bilateral: Secondary | ICD-10-CM | POA: Diagnosis not present

## 2018-10-30 DIAGNOSIS — E119 Type 2 diabetes mellitus without complications: Secondary | ICD-10-CM | POA: Diagnosis not present

## 2018-11-02 ENCOUNTER — Encounter (HOSPITAL_COMMUNITY)
Admission: RE | Admit: 2018-11-02 | Discharge: 2018-11-02 | Disposition: A | Discharge status: Home / Self Care | Payer: Medicare Other | Source: Ambulatory Visit | Attending: Nephrology | Admitting: Nephrology

## 2018-11-02 ENCOUNTER — Other Ambulatory Visit: Payer: Self-pay

## 2018-11-02 DIAGNOSIS — N183 Chronic kidney disease, stage 3 unspecified: Secondary | ICD-10-CM

## 2018-11-02 DIAGNOSIS — N184 Chronic kidney disease, stage 4 (severe): Secondary | ICD-10-CM | POA: Insufficient documentation

## 2018-11-02 DIAGNOSIS — D631 Anemia in chronic kidney disease: Secondary | ICD-10-CM | POA: Diagnosis not present

## 2018-11-02 LAB — IRON AND TIBC
Iron: 111 ug/dL (ref 28–170)
Saturation Ratios: 51 % — ABNORMAL HIGH (ref 10.4–31.8)
TIBC: 218 ug/dL — ABNORMAL LOW (ref 250–450)
UIBC: 107 ug/dL

## 2018-11-02 LAB — RENAL FUNCTION PANEL
Albumin: 3.2 g/dL — ABNORMAL LOW (ref 3.5–5.0)
Anion gap: 10 (ref 5–15)
BUN: 40 mg/dL — ABNORMAL HIGH (ref 8–23)
CO2: 23 mmol/L (ref 22–32)
Calcium: 9 mg/dL (ref 8.9–10.3)
Chloride: 108 mmol/L (ref 98–111)
Creatinine, Ser: 3.17 mg/dL — ABNORMAL HIGH (ref 0.44–1.00)
GFR calc Af Amer: 16 mL/min — ABNORMAL LOW (ref >60)
GFR calc non Af Amer: 14 mL/min — ABNORMAL LOW (ref >60)
Glucose, Bld: 162 mg/dL — ABNORMAL HIGH (ref 70–99)
Phosphorus: 3.6 mg/dL (ref 2.5–4.6)
Potassium: 4.2 mmol/L (ref 3.5–5.1)
Sodium: 141 mmol/L (ref 135–145)

## 2018-11-02 LAB — FERRITIN: Ferritin: 570 ng/mL — ABNORMAL HIGH (ref 11–307)

## 2018-11-02 LAB — POCT HEMOGLOBIN-HEMACUE: Hemoglobin: 9.5 g/dL — ABNORMAL LOW (ref 12.0–15.0)

## 2018-11-02 MED ORDER — EPOETIN ALFA 20000 UNIT/ML IJ SOLN
INTRAMUSCULAR | Status: AC
  Administered 2018-11-02: 20000 [IU]
  Filled 2018-11-02: qty 1

## 2018-11-02 MED ORDER — EPOETIN ALFA 10000 UNIT/ML IJ SOLN
INTRAMUSCULAR | Status: AC
  Administered 2018-11-02: 13:00:00 10000 [IU]
  Filled 2018-11-02: qty 1

## 2018-11-02 MED ORDER — EPOETIN ALFA 40000 UNIT/ML IJ SOLN: 30000.0000 [IU] | INTRAMUSCULAR | Status: DC

## 2018-11-03 ENCOUNTER — Telehealth (HOSPITAL_COMMUNITY): Payer: Self-pay

## 2018-11-03 LAB — PTH, INTACT AND CALCIUM
Calcium, Total (PTH): 9.2 mg/dL (ref 8.7–10.3)
PTH: 74 pg/mL — ABNORMAL HIGH (ref 15–65)

## 2018-11-03 NOTE — Telephone Encounter (Signed)

## 2018-11-04 ENCOUNTER — Ambulatory Visit (INDEPENDENT_AMBULATORY_CARE_PROVIDER_SITE_OTHER): Payer: Medicare Other | Admitting: Vascular Surgery

## 2018-11-04 ENCOUNTER — Ambulatory Visit (HOSPITAL_COMMUNITY)
Admission: RE | Admit: 2018-11-04 | Discharge: 2018-11-04 | Disposition: A | Discharge status: Home / Self Care | Payer: Medicare Other | Source: Ambulatory Visit | Attending: Vascular Surgery | Admitting: Vascular Surgery

## 2018-11-04 ENCOUNTER — Encounter: Payer: Self-pay | Admitting: *Deleted

## 2018-11-04 ENCOUNTER — Other Ambulatory Visit: Payer: Self-pay | Admitting: *Deleted

## 2018-11-04 ENCOUNTER — Encounter: Payer: Self-pay | Admitting: Vascular Surgery

## 2018-11-04 ENCOUNTER — Other Ambulatory Visit: Payer: Self-pay

## 2018-11-04 ENCOUNTER — Ambulatory Visit (INDEPENDENT_AMBULATORY_CARE_PROVIDER_SITE_OTHER)
Admission: RE | Admit: 2018-11-04 | Discharge: 2018-11-04 | Disposition: A | Discharge status: Home / Self Care | Payer: Medicare Other | Source: Ambulatory Visit | Attending: Vascular Surgery | Admitting: Vascular Surgery

## 2018-11-04 VITALS — BP 176/92 | HR 67 | Temp 97.4°F | Resp 20 | Ht 64.5 in | Wt 261.4 lb

## 2018-11-04 DIAGNOSIS — N183 Chronic kidney disease, stage 3 unspecified: Secondary | ICD-10-CM

## 2018-11-04 DIAGNOSIS — N184 Chronic kidney disease, stage 4 (severe): Secondary | ICD-10-CM | POA: Diagnosis not present

## 2018-11-04 NOTE — Progress Notes (Signed)
Reviewed all pre-op instructions with patient and caregiver [name].

## 2018-11-04 NOTE — Progress Notes (Signed)
REASON FOR CONSULT:    To evaluate for hemodialysis access.  The consult is requested by Dr. [name].  ASSESSMENT & PLAN:   STAGE IV CHRONIC KIDNEY DISEASE: This patient has stage IV chronic kidney disease.  We have been asked to place an AV fistula but hold off on an AV graft if a fistula is not possible.  Based on her vein map in my own assessment I think her only option for a fistula would be a right brachiocephalic fistula.  I think she would be a reasonable candidate for this.  I have discussed the indications for the procedure and the potential complications and she is agreeable to proceed.  Her surgery has been scheduled for 11/16/2018.  Deitra Mayo, MD, FACS Beeper [phone] Office: [phone]   HPI:   Joann Gutierrez is a pleasant 73 y.o. female, who was referred for evaluation for hemodialysis access.  I have reviewed the records from the referring office.  The patient has stage IV/V chronic kidney disease.  The patient was last seen on 09/15/2018 by Dr. Posey Pronto.  She is referred for evaluation for hemodialysis access.  Of note the referring documents note that if we are unable to place an AV fistula we should wait before placing an AV graft.  The patient believes that her kidney problems are related to hypertension.  She denies any recent uremic symptoms.  She does have some chronic shortness of breath but is had no new symptoms.  She denies nausea, vomiting, fatigue, anorexia, or palpitations.  She is right-handed.  She does not have a pacemaker.  Past Medical History:  Diagnosis Date  . Adenomatous colon polyp   . Allergy   . Anemia   . Asthma       . CAD (coronary artery disease)    Mild very minimal coronary disease with 20% obtuse marginal stenosis  . Carpal tunnel syndrome on left   . Cataract   . CHF (congestive heart failure) ([place])   . Chronic kidney disease (CKD), stage III (moderate)   . COPD (chronic obstructive pulmonary disease) ([place])   .  Depression   . 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 Diverticulosis   . Dyspnea   . 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 Esophageal dysmotility   . Esophageal stricture   . Gastritis   . Gastroparesis 08/21/2007  . GERD (gastroesophageal reflux disease)   . Hearing loss of both ears   . Hernia, hiatal   . Hyperlipidemia   . Hypertension   . Morbid obesity ([place])   . OSTEOARTHRITIS 08/09/2006  . Osteoporosis   . Oxygen deficiency    prn   . 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 RENAL INSUFFICIENCY 02/16/2009  . 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 Seizures ([place])    pt thinks it has been several monthes since she had a seisure  . Shingles   . Sickle cell trait ([place])   . Sleep apnea    Does not use CPAP - pateint states her cpap is broken  . Stroke [facility])     Family History  Problem Relation Age of Onset  . Liver cancer Mother        Liver Cancer  . Diabetes Mother   . Kidney disease Mother   . Heart disease Mother        age 52's  . Heart disease Father 68       MI  . Heart attack  Father        died of MI when pt was 64  . Sickle cell anemia Father   . Colon cancer Brother   . Cancer Brother        Colon Cancer  . Diabetes Sister   . Kidney disease Sister   . Heart disease Sister        age 72's  . Allergies Sister   . Diabetes Sister   . Kidney disease Sister   . Heart disease Sister        age 84's  . Esophageal cancer Neg Hx   . Rectal cancer Neg Hx   . Stomach cancer Neg Hx   . Amblyopia Neg Hx   . Blindness Neg Hx   . Glaucoma Neg Hx   . Macular degeneration Neg Hx   . Retinal detachment Neg Hx   . Cataracts Neg Hx   . Strabismus Neg Hx   . Retinitis pigmentosa Neg Hx     SOCIAL HISTORY: Social History   Socioeconomic History  . Marital status: Widowed    Spouse name: Not on file  . Number of children: 2  . Years of education: Not on file  . Highest education level: Not on file  Occupational History  . Occupation:  Retired    Fish farm manager: UNEMPLOYED  Social Needs  . Financial resource strain: Not on file  . Food insecurity    Worry: Not on file    Inability: Not on file  . Transportation needs    Medical: Not on file    Non-medical: Not on file  Tobacco Use  . Smoking status: Former Smoker    Packs/day: 0.50    Years: 10.00    Pack years: 5.00    Quit date: 04/18/1980    Years since quitting: 38.5  . Smokeless tobacco: Never Used  Substance and Sexual Activity  . Alcohol use: No    Alcohol/week: 0.0 standard drinks  . Drug use: No  . Sexual activity: Not Currently    Birth control/protection: Post-menopausal    Comment: Hysterectomy  Lifestyle  . Physical activity    Days per week: Not on file    Minutes per session: Not on file  . Stress: Not on file  Relationships  . Social Herbalist on phone: Not on file    Gets together: Not on file    Attends religious service: Not on file    Active member of club or organization: Not on file    Attends meetings of clubs or organizations: Not on file    Relationship status: Not on file  . Intimate partner violence    Fear of current or ex partner: Not on file    Emotionally abused: Not on file    Physically abused: Not on file    Forced sexual activity: Not on file  Other Topics Concern  . Not on file  Social History Narrative   Previously worked as a Electrical engineer.   Daily Caffeine Use-Coffee and Tea   Lives with a friend who is her care giver, has home health nurse come out once a week.  She has family in town- daughter, grand daughter.     Allergies  Allergen Reactions  . Morphine And Related Other (See Comments)    Family request not to be given, reports pt does not wake up when given   . Promethazine Hcl Other (See Comments)    REACTION: lethargy    Current  Outpatient Medications  Medication Sig Dispense Refill  . albuterol (PROVENTIL HFA;VENTOLIN HFA) 108 (90 Base) MCG/ACT inhaler Inhale 2 puffs into the lungs every  6 (six) hours as needed for wheezing or shortness of breath. 1 Inhaler 6  . amLODipine (NORVASC) 2.5 MG tablet Take 2.5 mg by mouth daily.    Marland Kitchen aspirin EC 81 MG tablet Take 81 mg by mouth daily.    Marland Kitchen azelastine (ASTELIN) 0.1 % nasal spray Place 1 spray into both nostrils 2 (two) times daily as needed (allergies). Use in each nostril as directed     . bisoprolol (ZEBETA) 5 MG tablet Take 1 tablet (5 mg total) by mouth 2 (two) times daily. (Patient taking differently: Take 5 mg by mouth daily. ) 60 tablet 2  . donepezil (ARICEPT) 10 MG tablet Take 10 mg by mouth at bedtime.    Marland Kitchen ezetimibe (ZETIA) 10 MG tablet Take 10 mg by mouth daily.     . furosemide (LASIX) 80 MG tablet Take 80 mg by mouth 2 (two) times daily.    Marland Kitchen gabapentin (NEURONTIN) 100 MG capsule Take 1 capsule (100 mg total) by mouth 2 (two) times daily.    . hydrALAZINE (APRESOLINE) 100 MG tablet Take 100 mg by mouth 3 (three) times daily.     . insulin aspart (NOVOLOG) 100 UNIT/ML injection Inject 5 Units into the skin 3 (three) times daily with meals. (Patient taking differently: Inject 0-15 Units into the skin 3 (three) times daily with meals. )    . isosorbide mononitrate (IMDUR) 120 MG 24 hr tablet isosorbide mononitrate ER 120 mg tablet,extended release 24 hr    . ketorolac (ACULAR) 0.4 % SOLN Place 1 drop into the left eye 2 (two) times a day.    Marland Kitchen LEVEMIR FLEXTOUCH 100 UNIT/ML Pen Inject 18 Units into the skin daily. (Patient taking differently: Inject 30 Units into the skin daily. )    . losartan (COZAAR) 100 MG tablet Take 100 mg by mouth daily.    . montelukast (SINGULAIR) 10 MG tablet Take 1 tablet (10 mg total) by mouth daily. (Patient taking differently: Take 10 mg by mouth at bedtime. ) 30 tablet 3  . nitroGLYCERIN (NITROSTAT) 0.4 MG SL tablet PLACE ONE TABLET UNDER THE TONGUE EVERY FIVE MINUTES AS NEEDED FOR CHEST PAIN 25 tablet 6  . NOVOLOG FLEXPEN 100 UNIT/ML FlexPen INJECT 5 UNITS SUBCUTANEOUSLY 3 TIMES DAILY AS DIRECTED  SLIDING SCALE    . omeprazole (PRILOSEC) 20 MG capsule Take 20 mg by mouth 2 (two) times a day.    Marland Kitchen omeprazole (PRILOSEC) 40 MG capsule Take 1 capsule (40 mg total) by mouth 2 (two) times a day. 180 capsule 1  . OXYGEN Inhale 2 L into the lungs as needed. CPAP with oxygen at bedtime     . polyethylene glycol (MIRALAX) 17 g packet Take 17 g by mouth daily as needed. (Patient taking differently: Take 17 g by mouth daily as needed (constipation.). ) 14 each 0  . raloxifene (EVISTA) 60 MG tablet Take 60 mg by mouth every evening.     . TRELEGY ELLIPTA 100-62.5-25 MCG/INH AEPB Inhale 1 puff into the lungs daily. 60 each 1  . TRUEPLUS PEN NEEDLES 32G X 4 MM MISC INJECT THREE TIMES A DAY AS DIRECTED  1  . Vitamin D, Ergocalciferol, (DRISDOL) 50000 UNITS CAPS capsule Take 50,000 Units by mouth every Friday.     . ondansetron (ZOFRAN ODT) 4 MG disintegrating tablet Take 1 tablet (4 mg  total) by mouth every 8 (eight) hours as needed for nausea or vomiting. (Patient not taking: Reported on 07/28/2018) 30 tablet 3   No current facility-administered medications for this visit.     REVIEW OF SYSTEMS:  [X]  denotes positive finding, [ ]  denotes negative finding Cardiac  Comments:  Chest pain or chest pressure:    Shortness of breath upon exertion: x   Short of breath when lying flat:    Irregular heart rhythm:        Vascular    Pain in calf, thigh, or hip brought on by ambulation:    Pain in feet at night that wakes you up from your sleep:     Blood clot in your veins:    Leg swelling:         Pulmonary    Oxygen at home:    Productive cough:     Wheezing:         Neurologic    Sudden weakness in arms or legs:     Sudden numbness in arms or legs:     Sudden onset of difficulty speaking or slurred speech:    Temporary loss of vision in one eye:     Problems with dizziness:         Gastrointestinal    Blood in stool:     Vomited blood:         Genitourinary    Burning when urinating:      Blood in urine:        Psychiatric    Major depression:         Hematologic    Bleeding problems:    Problems with blood clotting too easily:        Skin    Rashes or ulcers:        Constitutional    Fever or chills:     PHYSICAL EXAM:   Vitals:   11/04/18 1050  BP: (!) 176/92  Pulse: 67  Resp: 20  Temp: (!) 97.4 F (36.3 C)  SpO2: 96%  Weight: 261 lb 6.4 oz (118.6 kg)  Height: 5' 4.5" (1.638 m)   Body mass index is 44.18 kg/m.  GENERAL: The patient is a well-nourished female, in no acute distress. The vital signs are documented above. CARDIAC: There is a regular rate and rhythm.  VASCULAR: She has palpable radial pulses. I did look at her right upper arm cephalic vein.  At the antecubital level the vein looked reasonable in size.  The vein is somewhat small further proximally however I think this is her best chance for a fistula. PULMONARY: There is good air exchange bilaterally without wheezing or rales. ABDOMEN: Soft and non-tender with normal pitched bowel sounds.  MUSCULOSKELETAL: There are no major deformities or cyanosis. NEUROLOGIC: No focal weakness or paresthesias are detected. SKIN: There are no ulcers or rashes noted. PSYCHIATRIC: The patient has a normal affect.  DATA:    LABS: I have reviewed the labs from 11/02/2018.  Her GFR was 16.  ARTERIAL DUPLEX: I have independently interpreted her arterial duplex scan today.   On the right side there is a triphasic radial and ulnar waveform.  The brachial artery measures 0.58 cm in diameter.  On the left side there is a triphasic radial and ulnar waveform.  The brachial artery measures 0.46 cm in diameter.  VEIN MAP: I have independently interpreted her vein map today.  On the right side, the forearm cephalic vein is very small.  The  upper arm cephalic vein looks reasonable in size.  The basilic vein on the right is not visualized.  On the left side, the forearm and upper arm cephalic vein are small.  The  basilic vein on the left is marginal in size.

## 2018-11-04 NOTE — H&P (View-Only) (Signed)
REASON FOR CONSULT:    To evaluate for hemodialysis access.  The consult is requested by Dr. [name].  ASSESSMENT & PLAN:   STAGE IV CHRONIC KIDNEY DISEASE: This patient has stage IV chronic kidney disease.  We have been asked to place an AV fistula but hold off on an AV graft if a fistula is not possible.  Based on her vein map in my own assessment I think her only option for a fistula would be a right brachiocephalic fistula.  I think she would be a reasonable candidate for this.  I have discussed the indications for the procedure and the potential complications and she is agreeable to proceed.  Her surgery has been scheduled for 11/16/2018.  Deitra Mayo, MD, FACS Beeper [phone] Office: [phone]   HPI:   Joann Gutierrez is a pleasant 73 y.o. female, who was referred for evaluation for hemodialysis access.  I have reviewed the records from the referring office.  The patient has stage IV/V chronic kidney disease.  The patient was last seen on 09/15/2018 by Dr. Posey Pronto.  She is referred for evaluation for hemodialysis access.  Of note the referring documents note that if we are unable to place an AV fistula we should wait before placing an AV graft.  The patient believes that her kidney problems are related to hypertension.  She denies any recent uremic symptoms.  She does have some chronic shortness of breath but is had no new symptoms.  She denies nausea, vomiting, fatigue, anorexia, or palpitations.  She is right-handed.  She does not have a pacemaker.  Past Medical History:  Diagnosis Date  . Adenomatous colon polyp   . Allergy   . Anemia   . Asthma       . CAD (coronary artery disease)    Mild very minimal coronary disease with 20% obtuse marginal stenosis  . Carpal tunnel syndrome on left   . Cataract   . CHF (congestive heart failure) ([place])   . Chronic kidney disease (CKD), stage III (moderate)   . COPD (chronic obstructive pulmonary disease) ([place])   .  Depression   . 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 Diverticulosis   . Dyspnea   . 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 Esophageal dysmotility   . Esophageal stricture   . Gastritis   . Gastroparesis 08/21/2007  . GERD (gastroesophageal reflux disease)   . Hearing loss of both ears   . Hernia, hiatal   . Hyperlipidemia   . Hypertension   . Morbid obesity ([place])   . OSTEOARTHRITIS 08/09/2006  . Osteoporosis   . Oxygen deficiency    prn   . 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 RENAL INSUFFICIENCY 02/16/2009  . 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 Seizures ([place])    pt thinks it has been several monthes since she had a seisure  . Shingles   . Sickle cell trait ([place])   . Sleep apnea    Does not use CPAP - pateint states her cpap is broken  . Stroke [facility])     Family History  Problem Relation Age of Onset  . Liver cancer Mother        Liver Cancer  . Diabetes Mother   . Kidney disease Mother   . Heart disease Mother        age 66's  . Heart disease Father 82       MI  . Heart attack  Father        died of MI when pt was 89  . Sickle cell anemia Father   . Colon cancer Brother   . Cancer Brother        Colon Cancer  . Diabetes Sister   . Kidney disease Sister   . Heart disease Sister        age 30's  . Allergies Sister   . Diabetes Sister   . Kidney disease Sister   . Heart disease Sister        age 17's  . Esophageal cancer Neg Hx   . Rectal cancer Neg Hx   . Stomach cancer Neg Hx   . Amblyopia Neg Hx   . Blindness Neg Hx   . Glaucoma Neg Hx   . Macular degeneration Neg Hx   . Retinal detachment Neg Hx   . Cataracts Neg Hx   . Strabismus Neg Hx   . Retinitis pigmentosa Neg Hx     SOCIAL HISTORY: Social History   Socioeconomic History  . Marital status: Widowed    Spouse name: Not on file  . Number of children: 2  . Years of education: Not on file  . Highest education level: Not on file  Occupational History  . Occupation:  Retired    Fish farm manager: UNEMPLOYED  Social Needs  . Financial resource strain: Not on file  . Food insecurity    Worry: Not on file    Inability: Not on file  . Transportation needs    Medical: Not on file    Non-medical: Not on file  Tobacco Use  . Smoking status: Former Smoker    Packs/day: 0.50    Years: 10.00    Pack years: 5.00    Quit date: 04/18/1980    Years since quitting: 38.5  . Smokeless tobacco: Never Used  Substance and Sexual Activity  . Alcohol use: No    Alcohol/week: 0.0 standard drinks  . Drug use: No  . Sexual activity: Not Currently    Birth control/protection: Post-menopausal    Comment: Hysterectomy  Lifestyle  . Physical activity    Days per week: Not on file    Minutes per session: Not on file  . Stress: Not on file  Relationships  . Social Herbalist on phone: Not on file    Gets together: Not on file    Attends religious service: Not on file    Active member of club or organization: Not on file    Attends meetings of clubs or organizations: Not on file    Relationship status: Not on file  . Intimate partner violence    Fear of current or ex partner: Not on file    Emotionally abused: Not on file    Physically abused: Not on file    Forced sexual activity: Not on file  Other Topics Concern  . Not on file  Social History Narrative   Previously worked as a Electrical engineer.   Daily Caffeine Use-Coffee and Tea   Lives with a friend who is her care giver, has home health nurse come out once a week.  She has family in town- daughter, grand daughter.     Allergies  Allergen Reactions  . Morphine And Related Other (See Comments)    Family request not to be given, reports pt does not wake up when given   . Promethazine Hcl Other (See Comments)    REACTION: lethargy    Current  Outpatient Medications  Medication Sig Dispense Refill  . albuterol (PROVENTIL HFA;VENTOLIN HFA) 108 (90 Base) MCG/ACT inhaler Inhale 2 puffs into the lungs every  6 (six) hours as needed for wheezing or shortness of breath. 1 Inhaler 6  . amLODipine (NORVASC) 2.5 MG tablet Take 2.5 mg by mouth daily.    Marland Kitchen aspirin EC 81 MG tablet Take 81 mg by mouth daily.    Marland Kitchen azelastine (ASTELIN) 0.1 % nasal spray Place 1 spray into both nostrils 2 (two) times daily as needed (allergies). Use in each nostril as directed     . bisoprolol (ZEBETA) 5 MG tablet Take 1 tablet (5 mg total) by mouth 2 (two) times daily. (Patient taking differently: Take 5 mg by mouth daily. ) 60 tablet 2  . donepezil (ARICEPT) 10 MG tablet Take 10 mg by mouth at bedtime.    Marland Kitchen ezetimibe (ZETIA) 10 MG tablet Take 10 mg by mouth daily.     . furosemide (LASIX) 80 MG tablet Take 80 mg by mouth 2 (two) times daily.    Marland Kitchen gabapentin (NEURONTIN) 100 MG capsule Take 1 capsule (100 mg total) by mouth 2 (two) times daily.    . hydrALAZINE (APRESOLINE) 100 MG tablet Take 100 mg by mouth 3 (three) times daily.     . insulin aspart (NOVOLOG) 100 UNIT/ML injection Inject 5 Units into the skin 3 (three) times daily with meals. (Patient taking differently: Inject 0-15 Units into the skin 3 (three) times daily with meals. )    . isosorbide mononitrate (IMDUR) 120 MG 24 hr tablet isosorbide mononitrate ER 120 mg tablet,extended release 24 hr    . ketorolac (ACULAR) 0.4 % SOLN Place 1 drop into the left eye 2 (two) times a day.    Marland Kitchen LEVEMIR FLEXTOUCH 100 UNIT/ML Pen Inject 18 Units into the skin daily. (Patient taking differently: Inject 30 Units into the skin daily. )    . losartan (COZAAR) 100 MG tablet Take 100 mg by mouth daily.    . montelukast (SINGULAIR) 10 MG tablet Take 1 tablet (10 mg total) by mouth daily. (Patient taking differently: Take 10 mg by mouth at bedtime. ) 30 tablet 3  . nitroGLYCERIN (NITROSTAT) 0.4 MG SL tablet PLACE ONE TABLET UNDER THE TONGUE EVERY FIVE MINUTES AS NEEDED FOR CHEST PAIN 25 tablet 6  . NOVOLOG FLEXPEN 100 UNIT/ML FlexPen INJECT 5 UNITS SUBCUTANEOUSLY 3 TIMES DAILY AS DIRECTED  SLIDING SCALE    . omeprazole (PRILOSEC) 20 MG capsule Take 20 mg by mouth 2 (two) times a day.    Marland Kitchen omeprazole (PRILOSEC) 40 MG capsule Take 1 capsule (40 mg total) by mouth 2 (two) times a day. 180 capsule 1  . OXYGEN Inhale 2 L into the lungs as needed. CPAP with oxygen at bedtime     . polyethylene glycol (MIRALAX) 17 g packet Take 17 g by mouth daily as needed. (Patient taking differently: Take 17 g by mouth daily as needed (constipation.). ) 14 each 0  . raloxifene (EVISTA) 60 MG tablet Take 60 mg by mouth every evening.     . TRELEGY ELLIPTA 100-62.5-25 MCG/INH AEPB Inhale 1 puff into the lungs daily. 60 each 1  . TRUEPLUS PEN NEEDLES 32G X 4 MM MISC INJECT THREE TIMES A DAY AS DIRECTED  1  . Vitamin D, Ergocalciferol, (DRISDOL) 50000 UNITS CAPS capsule Take 50,000 Units by mouth every Friday.     . ondansetron (ZOFRAN ODT) 4 MG disintegrating tablet Take 1 tablet (4 mg  total) by mouth every 8 (eight) hours as needed for nausea or vomiting. (Patient not taking: Reported on 07/28/2018) 30 tablet 3   No current facility-administered medications for this visit.     REVIEW OF SYSTEMS:  [X]  denotes positive finding, [ ]  denotes negative finding Cardiac  Comments:  Chest pain or chest pressure:    Shortness of breath upon exertion: x   Short of breath when lying flat:    Irregular heart rhythm:        Vascular    Pain in calf, thigh, or hip brought on by ambulation:    Pain in feet at night that wakes you up from your sleep:     Blood clot in your veins:    Leg swelling:         Pulmonary    Oxygen at home:    Productive cough:     Wheezing:         Neurologic    Sudden weakness in arms or legs:     Sudden numbness in arms or legs:     Sudden onset of difficulty speaking or slurred speech:    Temporary loss of vision in one eye:     Problems with dizziness:         Gastrointestinal    Blood in stool:     Vomited blood:         Genitourinary    Burning when urinating:      Blood in urine:        Psychiatric    Major depression:         Hematologic    Bleeding problems:    Problems with blood clotting too easily:        Skin    Rashes or ulcers:        Constitutional    Fever or chills:     PHYSICAL EXAM:   Vitals:   11/04/18 1050  BP: (!) 176/92  Pulse: 67  Resp: 20  Temp: (!) 97.4 F (36.3 C)  SpO2: 96%  Weight: 261 lb 6.4 oz (118.6 kg)  Height: 5' 4.5" (1.638 m)   Body mass index is 44.18 kg/m.  GENERAL: The patient is a well-nourished female, in no acute distress. The vital signs are documented above. CARDIAC: There is a regular rate and rhythm.  VASCULAR: She has palpable radial pulses. I did look at her right upper arm cephalic vein.  At the antecubital level the vein looked reasonable in size.  The vein is somewhat small further proximally however I think this is her best chance for a fistula. PULMONARY: There is good air exchange bilaterally without wheezing or rales. ABDOMEN: Soft and non-tender with normal pitched bowel sounds.  MUSCULOSKELETAL: There are no major deformities or cyanosis. NEUROLOGIC: No focal weakness or paresthesias are detected. SKIN: There are no ulcers or rashes noted. PSYCHIATRIC: The patient has a normal affect.  DATA:    LABS: I have reviewed the labs from 11/02/2018.  Her GFR was 16.  ARTERIAL DUPLEX: I have independently interpreted her arterial duplex scan today.   On the right side there is a triphasic radial and ulnar waveform.  The brachial artery measures 0.58 cm in diameter.  On the left side there is a triphasic radial and ulnar waveform.  The brachial artery measures 0.46 cm in diameter.  VEIN MAP: I have independently interpreted her vein map today.  On the right side, the forearm cephalic vein is very small.  The  upper arm cephalic vein looks reasonable in size.  The basilic vein on the right is not visualized.  On the left side, the forearm and upper arm cephalic vein are small.   The basilic vein on the left is marginal in size.

## 2018-11-06 IMAGING — MR MR HEAD W/O CM
9 of 10 series · 34 of 48 positions shown · non-contrast
Comparison: Head and cervical spine CT 10/24/2016 and earlier.
Brain MRI 07/03/2009.

CLINICAL DATA: 72-year-old female with ataxia and gait abnormality
on set this morning.

EXAM:
MRI HEAD WITHOUT CONTRAST
TECHNIQUE: Multiplanar, multiecho pulse sequences of the brain and surrounding
structures were obtained without intravenous contrast.

[Series 3: DWI · axial · 3.0mm · 0.94mm/px · z∈[-73,+54]mm · 8 of 87 slices shown (1 of 2)]
[im 1/87]
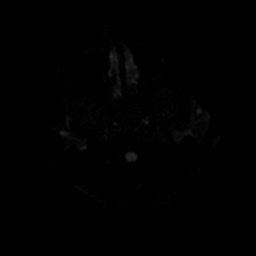
[im 10/87]
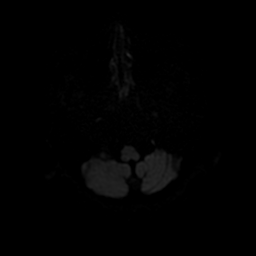
[im 29/87]
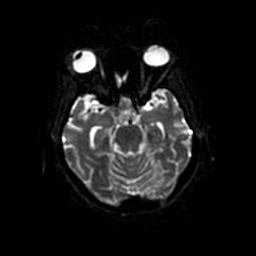
[im 39/87]
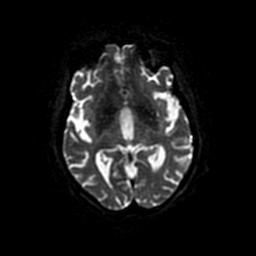
[im 48/87]
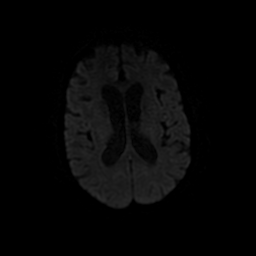
[im 58/87]
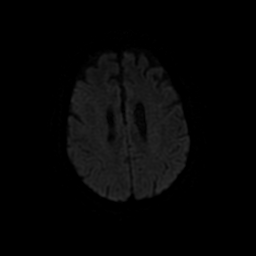
[im 77/87]
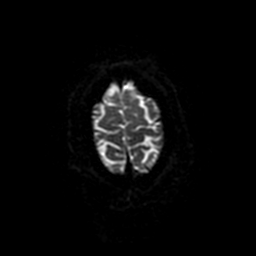
[im 87/87]
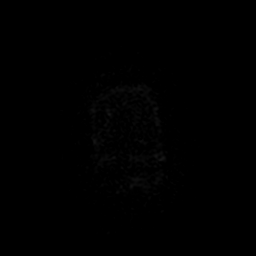

[Series 4: DWI · coronal · 4.0mm · 0.94mm/px · 6 of 62 slices shown (2 of 2)]
[im 1/62]
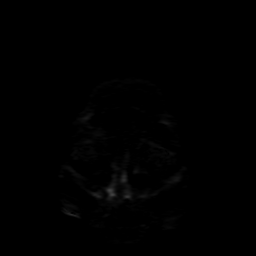
[im 13/62]
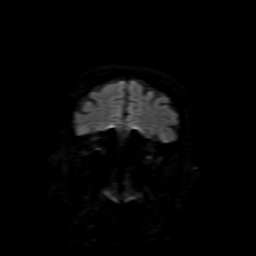
[im 25/62]
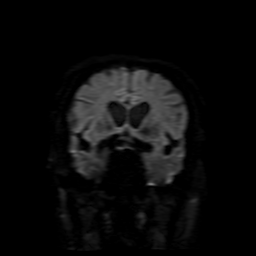
[im 37/62]
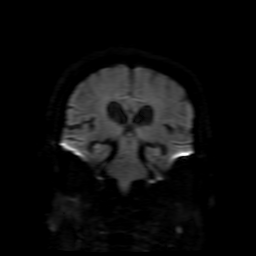
[im 49/62]
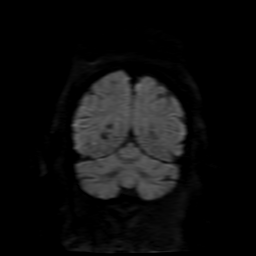
[im 62/62]
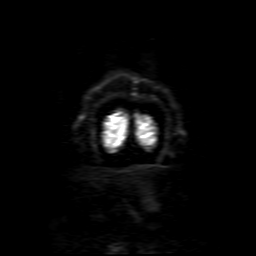

[Series 5: FLAIR · sagittal · 5.0mm · 0.47mm/px · 2 of 24 slices shown (1 of 2)]
[im 1/24]
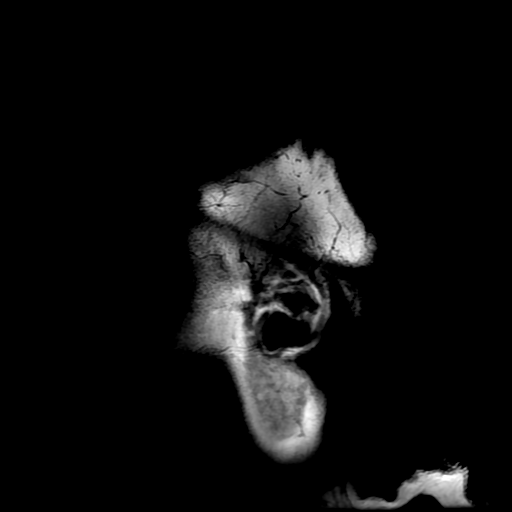
[im 24/24]
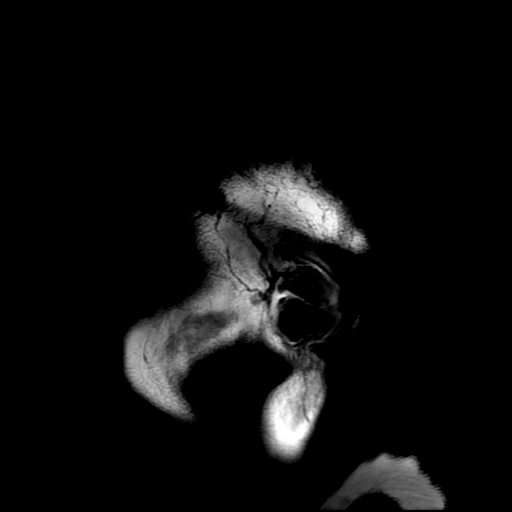

[Series 6: T2 · axial · 5.0mm · 0.47mm/px · z∈[-80,+56]mm · 2 of 24 slices shown (1 of 2)]
[im 1/24]
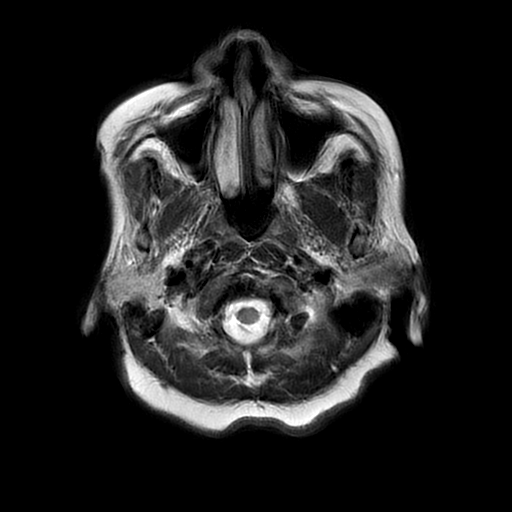
[im 24/24]
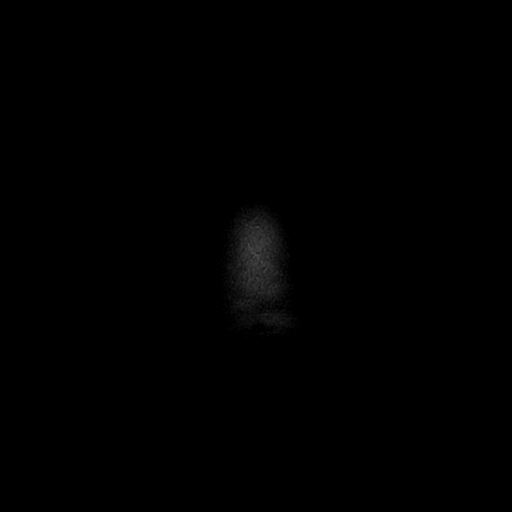

[Series 7: FLAIR · axial · 3.0mm · 0.47mm/px · z∈[-80,+56]mm · 2 of 24 slices shown (2 of 2)]
[im 1/24]
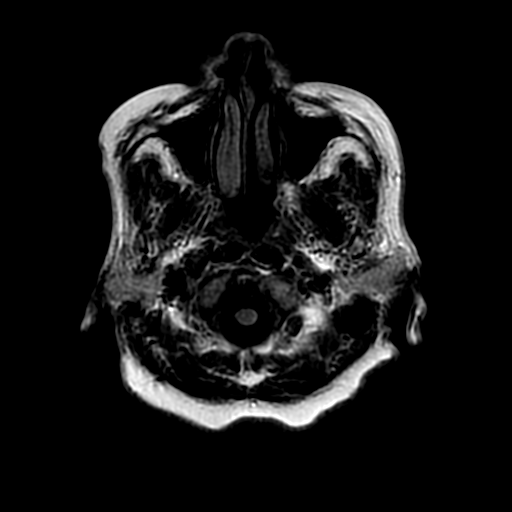
[im 24/24]
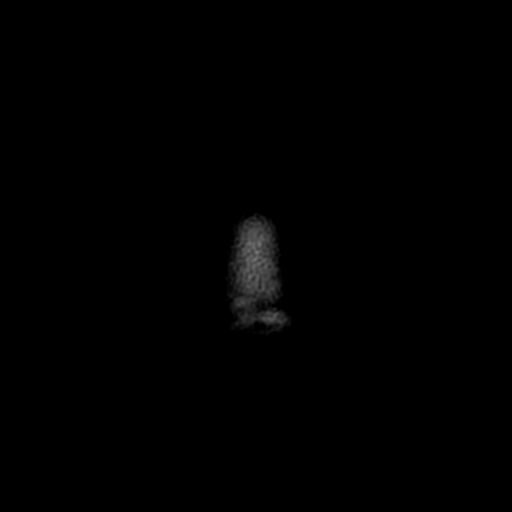

[Series 8: (person_name) · axial · 3.0mm · 0.47mm/px · z∈[-78,-34]mm · 3 of 92 slices shown]
[im 1/92]
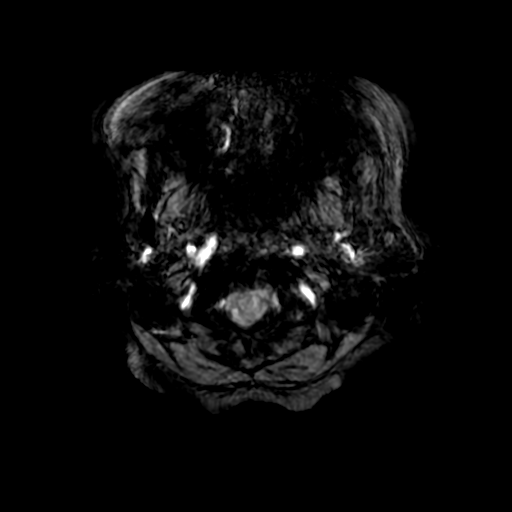
[im 11/92]
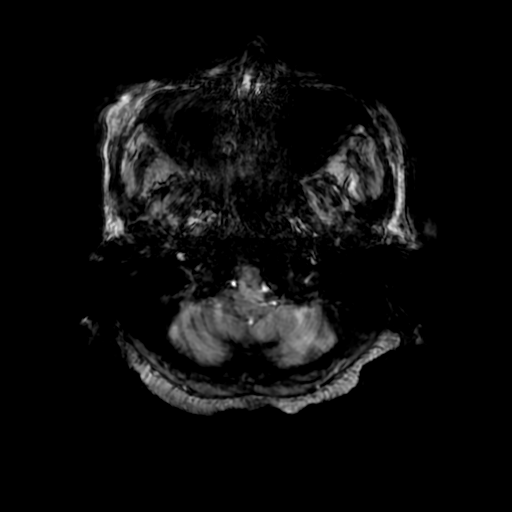
[im 31/92]
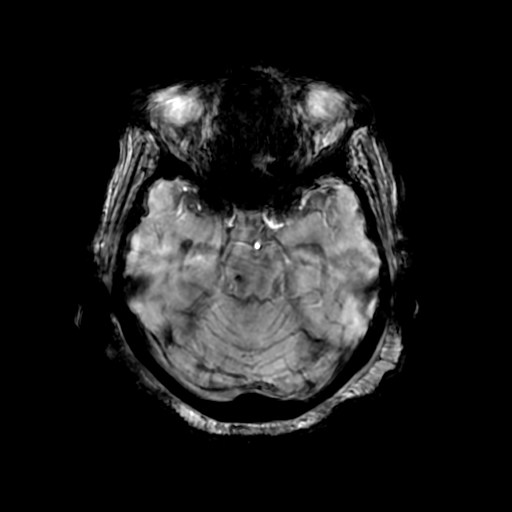

[Series 10: T2 · coronal · 5.0mm · 0.47mm/px · 3 of 28 slices shown (2 of 2)]
[im 1/28]
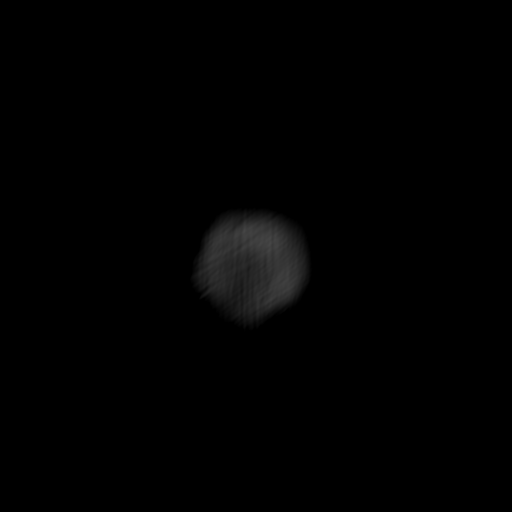
[im 14/28]
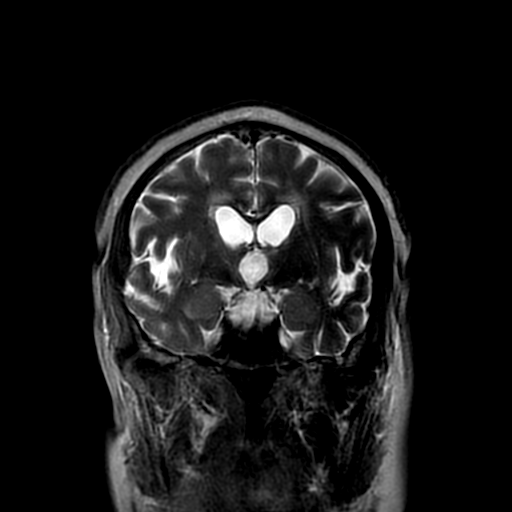
[im 28/28]
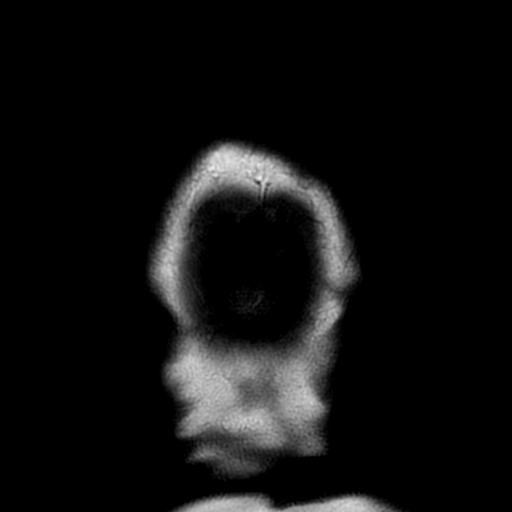

[Series 350: ADC · axial · 3.0mm · 0.94mm/px · z∈[-73,+54]mm · 5 of 44 slices shown (1 of 2)]
[im 1/44]
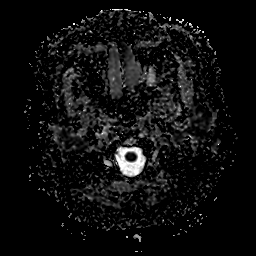
[im 11/44]
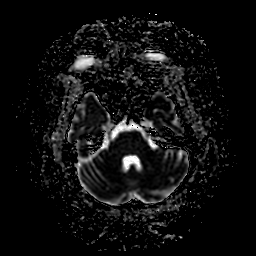
[im 22/44]
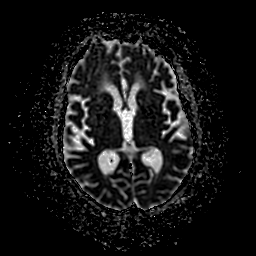
[im 33/44]
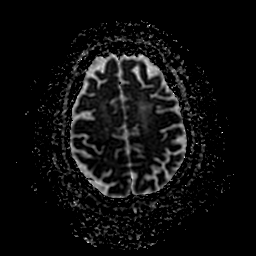
[im 44/44]
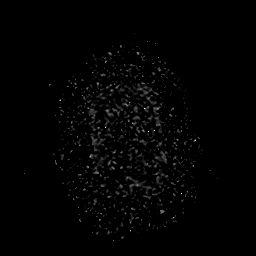

[Series 450: ADC · coronal · 4.0mm · 0.94mm/px · 3 of 31 slices shown (2 of 2)]
[im 1/31]
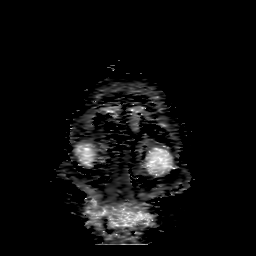
[im 16/31]
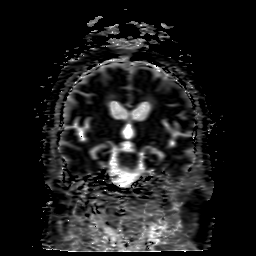
[im 31/31]
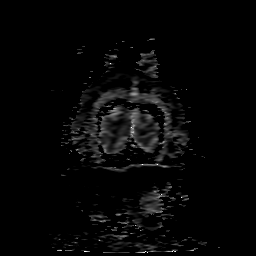

[34 of 48 positions shown; findings below may reference images not displayed]

FINDINGS: Brain: Small left lateral cerebellar infarct is stable since the
1911 brain MRI. A tiny right SCA territory lacune is also stable. No
restricted diffusion or evidence of acute infarction.

Chronic but increased heterogeneous T2 and FLAIR hyperintensity in
the bilateral deep gray matter nuclei and cerebral white matter
since the 1911 MRI. Evidence now of small chronic lacunar infarcts
in both thalami (series 9, image 22). There is a chronic
microhemorrhage in the right dorsal pons. Periventricular T2 and
FLAIR hyperintensity is now confluent anteriorly. No cortical
encephalomalacia. There is a chronic microhemorrhage in the right
periatrial white matter.

No restricted diffusion to suggest acute infarction. No midline
shift, mass effect, evidence of mass lesion, ventriculomegaly,
extra-axial collection or acute intracranial hemorrhage.
Cervicomedullary junction and pituitary are within normal limits.

Vascular: Major intracranial vascular flow voids are stable since
1911.

Skull and upper cervical spine: Visible chronic cervical spine
degeneration appears stable since 1911. Visualized bone marrow
signal is within normal limits.

Sinuses/Orbits: Stable and negative.

Other: Visible internal auditory structures appear normal. Mastoid
air cells remain clear. Scalp and face soft tissues appear negative.
IMPRESSION: 1.  No acute intracranial abnormality.
2. Mild chronic small vessel disease in the cerebellum is stable
since 1911, but there has been progression of disease in the
bilateral cerebral white matter, deep gray matter, and pons since
that time.

## 2018-11-06 IMAGING — DX DG CHEST 2V
2 series · 2 of 2 positions shown · non-contrast
Comparison: 10/24/2016; 05/20/2015

CLINICAL DATA: Chest and leg pain.  Headache

EXAM:
CHEST - 2 VIEW

[x chest ap]
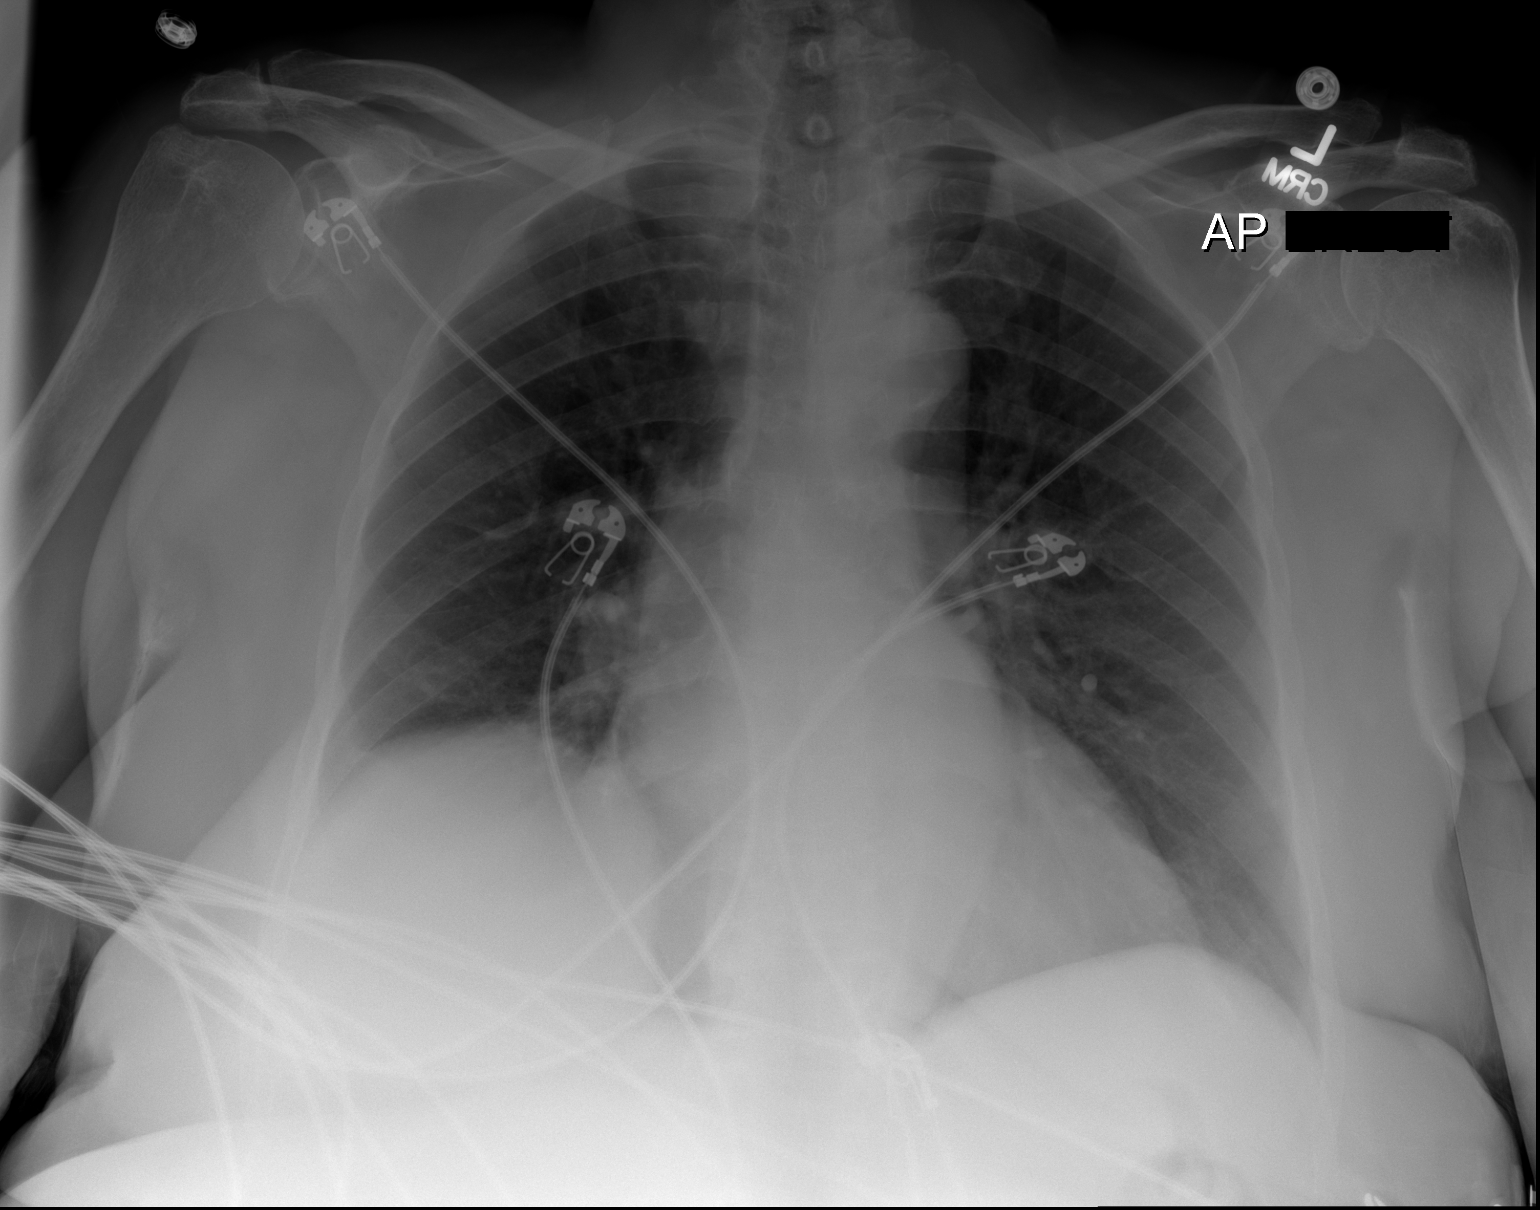

[w chest lat]
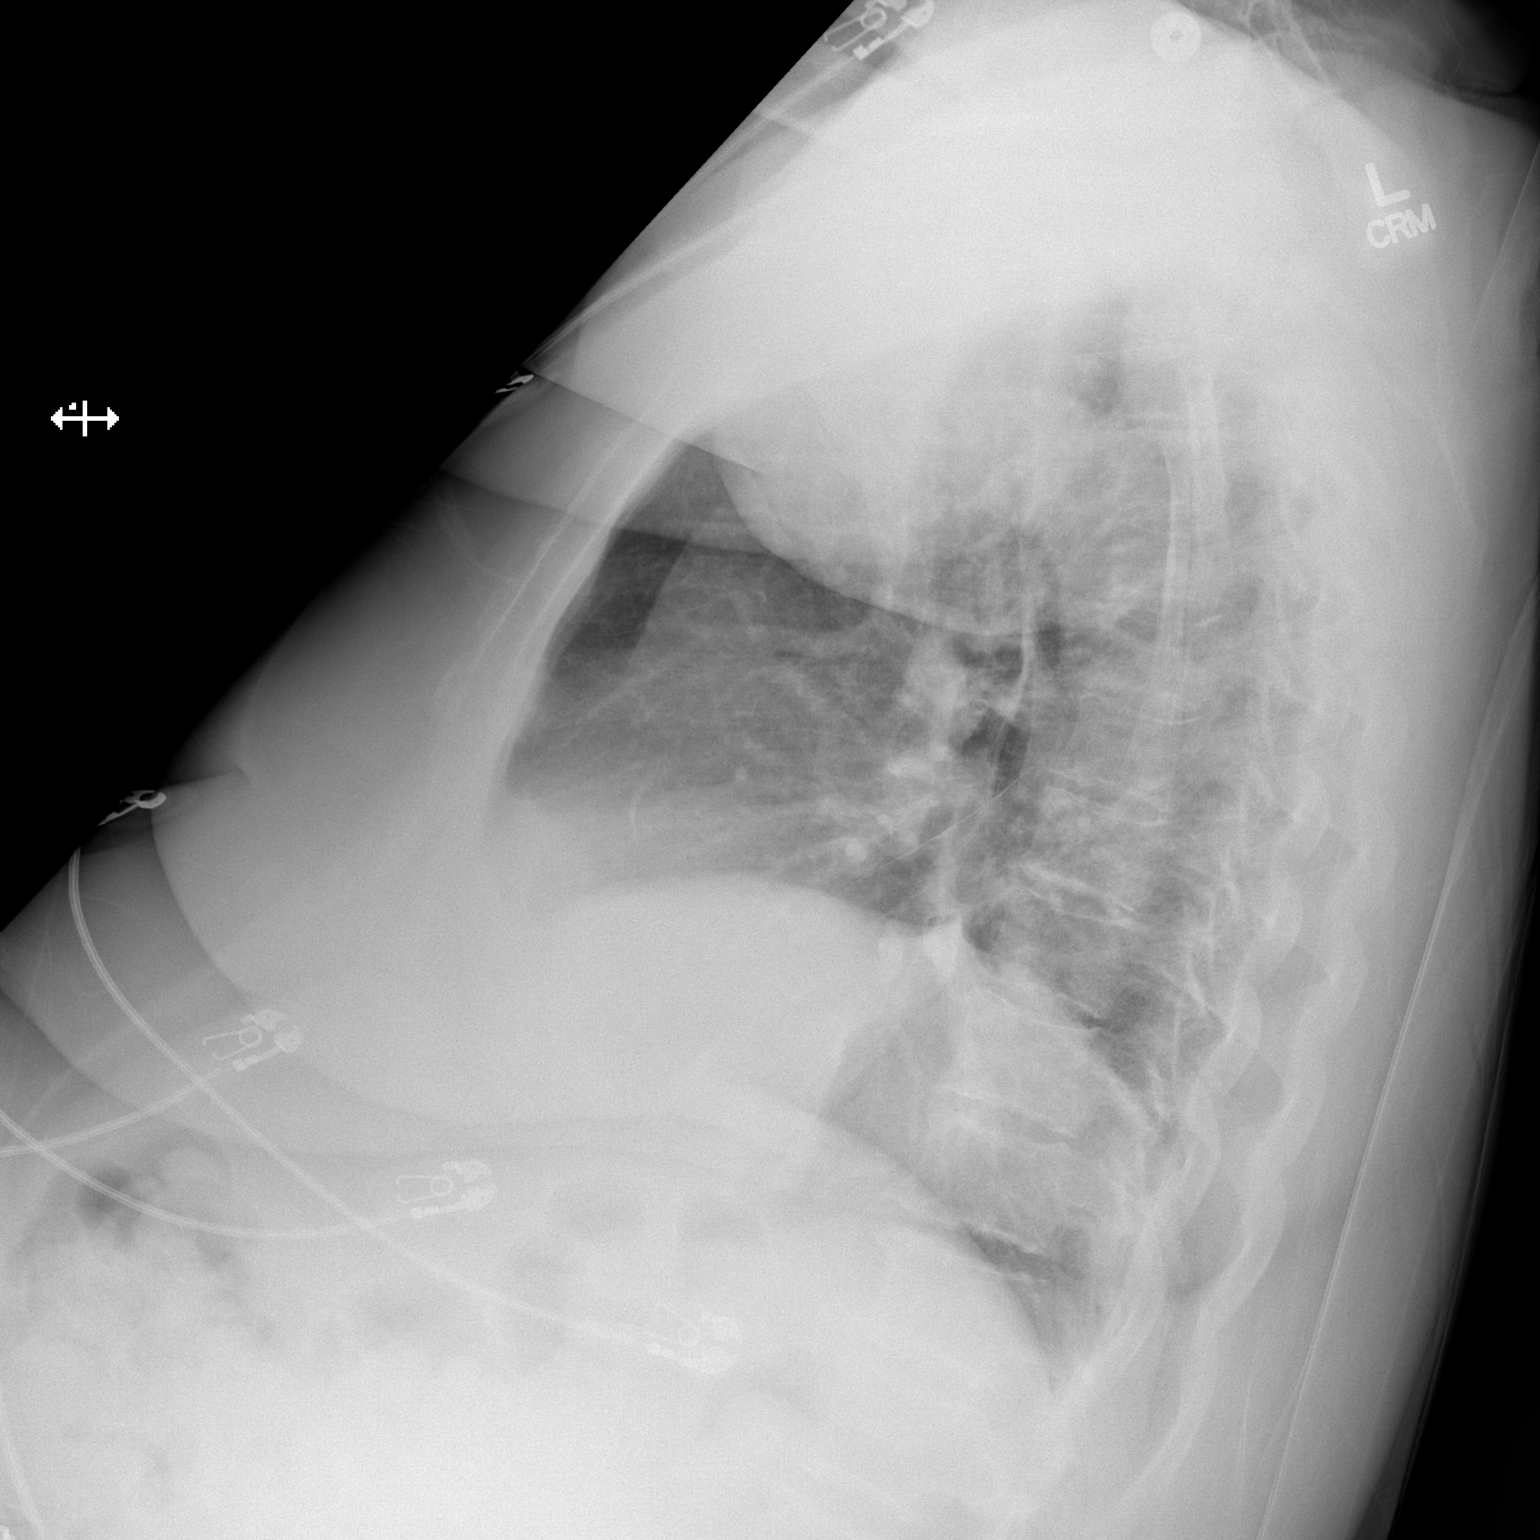

[2 of 2 positions shown; findings below may reference images not displayed]

FINDINGS: Grossly unchanged borderline enlarged cardiac silhouette and
mediastinal contours with tortuosity and potential slight ectasia of
the thoracic aorta. There is persistent elevation/eventration of the
right hemidiaphragm. No focal airspace opacities. No pleural
effusion or pneumothorax. No evidence of edema. No acute osseus
abnormalities.
IMPRESSION: Similar findings of borderline cardiomegaly without superimposed
acute cardiopulmonary disease.

## 2018-11-12 ENCOUNTER — Other Ambulatory Visit (HOSPITAL_COMMUNITY)
Admission: RE | Admit: 2018-11-12 | Discharge: 2018-11-12 | Disposition: A | Discharge status: Home / Self Care | Payer: Medicare Other | Source: Ambulatory Visit | Attending: Vascular Surgery | Admitting: Vascular Surgery

## 2018-11-12 DIAGNOSIS — Z20828 Contact with and (suspected) exposure to other viral communicable diseases: Secondary | ICD-10-CM | POA: Insufficient documentation

## 2018-11-12 DIAGNOSIS — Z01812 Encounter for preprocedural laboratory examination: Secondary | ICD-10-CM | POA: Insufficient documentation

## 2018-11-13 ENCOUNTER — Other Ambulatory Visit: Payer: Self-pay

## 2018-11-13 ENCOUNTER — Encounter (HOSPITAL_COMMUNITY): Payer: Self-pay | Admitting: *Deleted

## 2018-11-13 NOTE — Progress Notes (Signed)
Spoke with pt's friend/caretaker, Henderson Newcomer for pre-op call. Pt has mild CAD and HTN and sees Dr. Percival Spanish, last office visit was 03/20/18. [name] states pt has not had any recent chest pain or sob. Pt is a type 2 diabetic. [name] states an A1C was done this week at [facility], but have not heard the result. He states pt checks her blood sugar and it's usually in the 130's. Instructed [name] to have pt check her blood sugar when she gets up Monday AM. If blood sugar is >220 take 1/2 of usual correction dose of Novolog insulin. If blood sugar is 70 or below, treat with 1/2 cup of clear juice (apple or cranberry) and recheck blood sugar 15 minutes after drinking juice. Instructed [name] to have pt take 1/2 of her regular dose of Levemir Insulin Monday AM. He voiced understanding.  Instructed [name] to have pt bring her medications or list of medication with her day of surgery.  Pt is very hard of hearing.  Pt had Covid test done yesterday, result is pending. [name] states pt has been in quarantine since the test was done and understands that she needs to continue quarantine until day of surgery.  [name] voiced understanding of visitation policy.  I called [facility] and requested the A1C result. I was told that they would fax it to [place].

## 2018-11-14 LAB — NOVEL CORONAVIRUS, NAA (HOSP ORDER, SEND-OUT TO REF LAB; TAT 18-24 HRS): SARS-CoV-2, NAA: NOT DETECTED

## 2018-11-15 NOTE — Anesthesia Preprocedure Evaluation (Addendum)
Anesthesia Evaluation  Patient identified by MRN, date of birth, ID band Patient awake    Reviewed: Allergy & Precautions, NPO status , Patient's Chart, lab work & pertinent test results  History of Anesthesia Complications Negative for: history of anesthetic complications  Airway Mallampati: II  TM Distance: >3 FB Neck ROM: Full    Dental  (+) Dental Advisory Given, Edentulous Upper, Partial Lower   Pulmonary asthma , sleep apnea, Continuous Positive Airway Pressure Ventilation and Oxygen sleep apnea , COPD,  COPD inhaler, former smoker,    Pulmonary exam normal        Cardiovascular hypertension, Pt. on medications and Pt. on home beta blockers + CAD and +CHF  Normal cardiovascular exam   '19 Adenosine Stress - There was no ST segment deviation noted during stress. The study is normal. This is a low risk study.    Neuro/Psych Seizures -,  PSYCHIATRIC DISORDERS Depression  Neuromuscular disease (neuropathy) CVA    GI/Hepatic Neg liver ROS, hiatal hernia, GERD  Medicated and Controlled, Hx esophageal stricture Hx gastroparesis    Endo/Other  diabetes, Type 2, Insulin DependentMorbid obesity  Renal/GU ESRFRenal disease     Musculoskeletal  (+) Arthritis ,   Abdominal (+) + obese,   Peds  Hematology  (+) Blood dyscrasia, Sickle cell trait and anemia ,   Anesthesia Other Findings   Reproductive/Obstetrics                            Anesthesia Physical Anesthesia Plan  ASA: IV  Anesthesia Plan: MAC   Post-op Pain Management:    Induction: Intravenous  PONV Risk Score and Plan: 2 and Propofol infusion and Treatment may vary due to age or medical condition  Airway Management Planned: Natural Airway and Simple Face Mask  Additional Equipment: None  Intra-op Plan:   Post-operative Plan:   Informed Consent: I have reviewed the patients History and Physical, chart, labs and  discussed the procedure including the risks, benefits and alternatives for the proposed anesthesia with the patient or authorized representative who has indicated his/her understanding and acceptance.       Plan Discussed with: CRNA and Anesthesiologist  Anesthesia Plan Comments:        Anesthesia Quick Evaluation

## 2018-11-16 ENCOUNTER — Encounter (HOSPITAL_COMMUNITY)
Admission: RE | Disposition: A | Discharge status: Home / Self Care | Payer: Self-pay | Source: Home / Self Care | Attending: Vascular Surgery

## 2018-11-16 ENCOUNTER — Other Ambulatory Visit: Payer: Self-pay | Admitting: Pulmonary Disease

## 2018-11-16 ENCOUNTER — Other Ambulatory Visit: Payer: Self-pay

## 2018-11-16 ENCOUNTER — Encounter (HOSPITAL_COMMUNITY): Payer: Self-pay

## 2018-11-16 ENCOUNTER — Inpatient Hospital Stay (HOSPITAL_COMMUNITY)
Admission: RE | Admit: 2018-11-16 | Discharge: 2018-11-16 | Disposition: A | Discharge status: Home / Self Care | Payer: Medicare Other | Source: Ambulatory Visit | Attending: Nephrology | Admitting: Nephrology

## 2018-11-16 ENCOUNTER — Ambulatory Visit (HOSPITAL_COMMUNITY): Payer: Medicare Other | Admitting: Anesthesiology

## 2018-11-16 ENCOUNTER — Ambulatory Visit (HOSPITAL_COMMUNITY)
Admission: RE | Admit: 2018-11-16 | Discharge: 2018-11-16 | Disposition: A | Discharge status: Home / Self Care | Payer: Medicare Other | Attending: Vascular Surgery | Admitting: Vascular Surgery

## 2018-11-16 DIAGNOSIS — Z8673 Personal history of transient ischemic attack (TIA), and cerebral infarction without residual deficits: Secondary | ICD-10-CM | POA: Insufficient documentation

## 2018-11-16 DIAGNOSIS — J449 Chronic obstructive pulmonary disease, unspecified: Secondary | ICD-10-CM | POA: Diagnosis not present

## 2018-11-16 DIAGNOSIS — Z79899 Other long term (current) drug therapy: Secondary | ICD-10-CM | POA: Insufficient documentation

## 2018-11-16 DIAGNOSIS — K3184 Gastroparesis: Secondary | ICD-10-CM | POA: Insufficient documentation

## 2018-11-16 DIAGNOSIS — I130 Hypertensive heart and chronic kidney disease with heart failure and stage 1 through stage 4 chronic kidney disease, or unspecified chronic kidney disease: Secondary | ICD-10-CM | POA: Insufficient documentation

## 2018-11-16 DIAGNOSIS — Z7982 Long term (current) use of aspirin: Secondary | ICD-10-CM | POA: Insufficient documentation

## 2018-11-16 DIAGNOSIS — E1142 Type 2 diabetes mellitus with diabetic polyneuropathy: Secondary | ICD-10-CM | POA: Insufficient documentation

## 2018-11-16 DIAGNOSIS — I2510 Atherosclerotic heart disease of native coronary artery without angina pectoris: Secondary | ICD-10-CM | POA: Insufficient documentation

## 2018-11-16 DIAGNOSIS — I509 Heart failure, unspecified: Secondary | ICD-10-CM | POA: Insufficient documentation

## 2018-11-16 DIAGNOSIS — Z794 Long term (current) use of insulin: Secondary | ICD-10-CM | POA: Diagnosis not present

## 2018-11-16 DIAGNOSIS — I272 Pulmonary hypertension, unspecified: Secondary | ICD-10-CM | POA: Diagnosis not present

## 2018-11-16 DIAGNOSIS — E1122 Type 2 diabetes mellitus with diabetic chronic kidney disease: Secondary | ICD-10-CM | POA: Diagnosis not present

## 2018-11-16 DIAGNOSIS — K219 Gastro-esophageal reflux disease without esophagitis: Secondary | ICD-10-CM | POA: Insufficient documentation

## 2018-11-16 DIAGNOSIS — E1143 Type 2 diabetes mellitus with diabetic autonomic (poly)neuropathy: Secondary | ICD-10-CM | POA: Diagnosis not present

## 2018-11-16 DIAGNOSIS — Z87891 Personal history of nicotine dependence: Secondary | ICD-10-CM | POA: Insufficient documentation

## 2018-11-16 DIAGNOSIS — D631 Anemia in chronic kidney disease: Secondary | ICD-10-CM | POA: Insufficient documentation

## 2018-11-16 DIAGNOSIS — R569 Unspecified convulsions: Secondary | ICD-10-CM | POA: Diagnosis not present

## 2018-11-16 DIAGNOSIS — G4730 Sleep apnea, unspecified: Secondary | ICD-10-CM | POA: Insufficient documentation

## 2018-11-16 DIAGNOSIS — N184 Chronic kidney disease, stage 4 (severe): Secondary | ICD-10-CM | POA: Diagnosis not present

## 2018-11-16 DIAGNOSIS — H9193 Unspecified hearing loss, bilateral: Secondary | ICD-10-CM | POA: Insufficient documentation

## 2018-11-16 DIAGNOSIS — Z6841 Body Mass Index (BMI) 40.0 and over, adult: Secondary | ICD-10-CM | POA: Insufficient documentation

## 2018-11-16 DIAGNOSIS — M810 Age-related osteoporosis without current pathological fracture: Secondary | ICD-10-CM | POA: Diagnosis not present

## 2018-11-16 DIAGNOSIS — D573 Sickle-cell trait: Secondary | ICD-10-CM | POA: Insufficient documentation

## 2018-11-16 HISTORY — PX: AV FISTULA PLACEMENT: SHX1204

## 2018-11-16 HISTORY — PX: FISTULA SUPERFICIALIZATION: SHX6341

## 2018-11-16 LAB — POCT I-STAT, CHEM 8
BUN: 37 mg/dL — ABNORMAL HIGH (ref 8–23)
Calcium, Ion: 1.19 mmol/L (ref 1.15–1.40)
Chloride: 108 mmol/L (ref 98–111)
Creatinine, Ser: 3.1 mg/dL — ABNORMAL HIGH (ref 0.44–1.00)
Glucose, Bld: 76 mg/dL (ref 70–99)
HCT: 34 % — ABNORMAL LOW (ref 36.0–46.0)
Hemoglobin: 11.6 g/dL — ABNORMAL LOW (ref 12.0–15.0)
Potassium: 4.1 mmol/L (ref 3.5–5.1)
Sodium: 144 mmol/L (ref 135–145)
TCO2: 24 mmol/L (ref 22–32)

## 2018-11-16 LAB — GLUCOSE, CAPILLARY: Glucose-Capillary: 109 mg/dL — ABNORMAL HIGH (ref 70–99)

## 2018-11-16 SURGERY — ARTERIOVENOUS (AV) FISTULA CREATION
Anesthesia: Monitor Anesthesia Care | Site: Arm Upper | Laterality: Right

## 2018-11-16 MED ORDER — OXYCODONE HCL 5 MG PO TABS: 5.0000 mg | ORAL_TABLET | ORAL | 0 refills | Status: DC | PRN

## 2018-11-16 MED ORDER — ONDANSETRON HCL 4 MG/2ML IJ SOLN
INTRAMUSCULAR | Status: DC | PRN
  Administered 2018-11-16: 4 mg via INTRAVENOUS

## 2018-11-16 MED ORDER — CEFAZOLIN SODIUM-DEXTROSE 2-4 GM/100ML-% IV SOLN
2.0000 g | INTRAVENOUS | Status: AC
  Administered 2018-11-16: 2 g via INTRAVENOUS
  Filled 2018-11-16: qty 100

## 2018-11-16 MED ORDER — SODIUM CHLORIDE 0.9 % IV SOLN: INTRAVENOUS | Status: DC

## 2018-11-16 MED ORDER — LIDOCAINE-EPINEPHRINE (PF) 1 %-1:200000 IJ SOLN
INTRAMUSCULAR | Status: AC
  Filled 2018-11-16: qty 30

## 2018-11-16 MED ORDER — PROPOFOL 500 MG/50ML IV EMUL
INTRAVENOUS | Status: DC | PRN
  Administered 2018-11-16: 100 ug/kg/min via INTRAVENOUS

## 2018-11-16 MED ORDER — PROPOFOL 10 MG/ML IV BOLUS
INTRAVENOUS | Status: AC
  Filled 2018-11-16: qty 20

## 2018-11-16 MED ORDER — SODIUM CHLORIDE 0.9 % IV SOLN
INTRAVENOUS | Status: AC
  Filled 2018-11-16: qty 1.2

## 2018-11-16 MED ORDER — SODIUM CHLORIDE 0.9 % IV SOLN
INTRAVENOUS | Status: DC | PRN
  Administered 2018-11-16: 500 mL

## 2018-11-16 MED ORDER — CHLORHEXIDINE GLUCONATE 4 % EX LIQD: 60.0000 mL | Freq: Once | CUTANEOUS | Status: DC

## 2018-11-16 MED ORDER — LABETALOL HCL 5 MG/ML IV SOLN: 5.0000 mg | INTRAVENOUS | Status: DC | PRN

## 2018-11-16 MED ORDER — LIDOCAINE-EPINEPHRINE (PF) 1 %-1:200000 IJ SOLN
INTRAMUSCULAR | Status: DC | PRN
  Administered 2018-11-16: 17 mL

## 2018-11-16 MED ORDER — LIDOCAINE HCL 1 % IJ SOLN
INTRAMUSCULAR | Status: AC
  Filled 2018-11-16: qty 20

## 2018-11-16 MED ORDER — DEXTROSE 50 % IV SOLN
0.5000 | Freq: Once | INTRAVENOUS | Status: AC
  Administered 2018-11-16: 25 mL via INTRAVENOUS

## 2018-11-16 MED ORDER — ONDANSETRON HCL 4 MG/2ML IJ SOLN: 4.0000 mg | Freq: Once | INTRAMUSCULAR | Status: DC | PRN

## 2018-11-16 MED ORDER — 0.9 % SODIUM CHLORIDE (POUR BTL) OPTIME
TOPICAL | Status: DC | PRN
  Administered 2018-11-16: 08:00:00 1000 mL

## 2018-11-16 MED ORDER — LIDOCAINE-EPINEPHRINE (PF) 1 %-1:200000 IJ SOLN
INTRAMUSCULAR | Status: DC | PRN
  Administered 2018-11-16: 30 mL

## 2018-11-16 MED ORDER — SODIUM CHLORIDE 0.9 % IV SOLN
INTRAVENOUS | Status: DC | PRN
  Administered 2018-11-16: 20 ug/min via INTRAVENOUS

## 2018-11-16 MED ORDER — SODIUM CHLORIDE 0.9 % IV SOLN
INTRAVENOUS | Status: DC | PRN
  Administered 2018-11-16: 07:00:00 via INTRAVENOUS

## 2018-11-16 MED ORDER — HEPARIN SODIUM (PORCINE) 1000 UNIT/ML IJ SOLN
INTRAMUSCULAR | Status: DC | PRN
  Administered 2018-11-16: 10000 [IU] via INTRAVENOUS

## 2018-11-16 MED ORDER — PAPAVERINE HCL 30 MG/ML IJ SOLN
INTRAMUSCULAR | Status: AC
  Filled 2018-11-16: qty 2

## 2018-11-16 MED ORDER — PROPOFOL 10 MG/ML IV BOLUS: INTRAVENOUS | Status: DC | PRN

## 2018-11-16 MED ORDER — FENTANYL CITRATE (PF) 100 MCG/2ML IJ SOLN
INTRAMUSCULAR | Status: DC | PRN
  Administered 2018-11-16: 25 ug via INTRAVENOUS

## 2018-11-16 MED ORDER — PROTAMINE SULFATE 10 MG/ML IV SOLN
INTRAVENOUS | Status: DC | PRN
  Administered 2018-11-16: 50 mg via INTRAVENOUS
  Administered 2018-11-16: 20 mg via INTRAVENOUS

## 2018-11-16 MED ORDER — DEXTROSE 50 % IV SOLN
INTRAVENOUS | Status: AC
  Filled 2018-11-16: qty 50

## 2018-11-16 MED ORDER — FENTANYL CITRATE (PF) 100 MCG/2ML IJ SOLN: 25.0000 ug | INTRAMUSCULAR | Status: DC | PRN

## 2018-11-16 MED ORDER — FENTANYL CITRATE (PF) 250 MCG/5ML IJ SOLN
INTRAMUSCULAR | Status: AC
  Filled 2018-11-16: qty 5

## 2018-11-16 SURGICAL SUPPLY — 38 items
ADH SKN CLS APL DERMABOND .7 (GAUZE/BANDAGES/DRESSINGS) ×1
ARMBAND PINK RESTRICT EXTREMIT (MISCELLANEOUS) ×4 IMPLANT
CANISTER SUCT 3000ML PPV (MISCELLANEOUS) ×3 IMPLANT
CANNULA VESSEL 3MM 2 BLNT TIP (CANNULA) ×3 IMPLANT
CLIP VESOCCLUDE MED 6/CT (CLIP) ×3 IMPLANT
CLIP VESOCCLUDE SM WIDE 6/CT (CLIP) ×3 IMPLANT
COVER PROBE W GEL 5X96 (DRAPES) IMPLANT
COVER WAND RF STERILE (DRAPES) ×1 IMPLANT
DECANTER SPIKE VIAL GLASS SM (MISCELLANEOUS) ×3 IMPLANT
DERMABOND ADVANCED (GAUZE/BANDAGES/DRESSINGS) ×2
DERMABOND ADVANCED .7 DNX12 (GAUZE/BANDAGES/DRESSINGS) ×1 IMPLANT
ELECT REM PT RETURN 9FT ADLT (ELECTROSURGICAL) ×3
ELECTRODE REM PT RTRN 9FT ADLT (ELECTROSURGICAL) ×1 IMPLANT
GAUZE SPONGE 4X4 16PLY XRAY LF (GAUZE/BANDAGES/DRESSINGS) ×2 IMPLANT
GLOVE BIO SURGEON STRL SZ7.5 (GLOVE) ×3 IMPLANT
GLOVE BIOGEL PI IND STRL 6.5 (GLOVE) IMPLANT
GLOVE BIOGEL PI IND STRL 7.0 (GLOVE) IMPLANT
GLOVE BIOGEL PI IND STRL 8 (GLOVE) ×1 IMPLANT
GLOVE BIOGEL PI INDICATOR 6.5 (GLOVE) ×6
GLOVE BIOGEL PI INDICATOR 7.0 (GLOVE) ×6
GLOVE BIOGEL PI INDICATOR 8 (GLOVE) ×2
GLOVE ECLIPSE 7.0 STRL STRAW (GLOVE) ×2 IMPLANT
GOWN STRL REUS W/ TWL LRG LVL3 (GOWN DISPOSABLE) ×3 IMPLANT
GOWN STRL REUS W/TWL LRG LVL3 (GOWN DISPOSABLE) ×9
KIT BASIN OR (CUSTOM PROCEDURE TRAY) ×3 IMPLANT
KIT TURNOVER KIT B (KITS) ×3 IMPLANT
NS IRRIG 1000ML POUR BTL (IV SOLUTION) ×3 IMPLANT
PACK CV ACCESS (CUSTOM PROCEDURE TRAY) ×3 IMPLANT
PAD ARMBOARD 7.5X6 YLW CONV (MISCELLANEOUS) ×6 IMPLANT
SPONGE SURGIFOAM ABS GEL 100 (HEMOSTASIS) IMPLANT
SUT PROLENE 6 0 BV (SUTURE) ×5 IMPLANT
SUT VIC AB 3-0 SH 27 (SUTURE) ×6
SUT VIC AB 3-0 SH 27X BRD (SUTURE) ×1 IMPLANT
SUT VIC AB 4-0 PS2 18 (SUTURE) ×2 IMPLANT
SUT VICRYL 4-0 PS2 18IN ABS (SUTURE) ×3 IMPLANT
TOWEL GREEN STERILE (TOWEL DISPOSABLE) ×3 IMPLANT
UNDERPAD 30X30 (UNDERPADS AND DIAPERS) ×3 IMPLANT
WATER STERILE IRR 1000ML POUR (IV SOLUTION) ×3 IMPLANT

## 2018-11-16 NOTE — Progress Notes (Signed)
Confirmed w/Dr. Rosalia Hammers that patient is having surgery on her right upper extremity for creation of AV fistula.  Patient consent is signed and witnessed.

## 2018-11-16 NOTE — Anesthesia Procedure Notes (Signed)
Procedure Name: MAC Date/Time: 11/16/2018 8:02 AM Performed by: Marsa Aris, CRNA Pre-anesthesia Checklist: Patient identified, Emergency Drugs available, Suction available and Patient being monitored Patient Re-evaluated:Patient Re-evaluated prior to induction Oxygen Delivery Method: Simple face mask Preoxygenation: Pre-oxygenation with 100% oxygen Induction Type: IV induction

## 2018-11-16 NOTE — Interval H&P Note (Signed)
History and Physical Interval Note:  11/16/2018 7:27 AM  Joann Gutierrez  has presented today for surgery, with the diagnosis of CHRONIC KIDNEY DISEASE FOR HEMODIALYSIS ACCESS.  The various methods of treatment have been discussed with the patient and family. After consideration of risks, benefits and other options for treatment, the patient has consented to  Procedure(s): ARTERIOVENOUS (AV) FISTULA CREATION RIGHT ARM (Right) as a surgical intervention.  The patient's history has been reviewed, patient examined, no change in status, stable for surgery.  I have reviewed the patient's chart and labs.  Questions were answered to the patient's satisfaction.     Deitra Mayo

## 2018-11-16 NOTE — Anesthesia Postprocedure Evaluation (Signed)
Anesthesia Post Note  Patient: Faatima Tench Schnider  Procedure(s) Performed: CREATION RIGHT BRACHIOCEPHALIC FISTULA  ARTERIOVENOUS FISTULA (Right Arm Upper) Fistula Superficialization (Right Arm Upper)     Patient location during evaluation: PACU Anesthesia Type: MAC Level of consciousness: awake and alert Pain management: pain level controlled Vital Signs Assessment: post-procedure vital signs reviewed and stable Respiratory status: spontaneous breathing, nonlabored ventilation and respiratory function stable Cardiovascular status: stable and blood pressure returned to baseline Anesthetic complications: no    Last Vitals:  Vitals:   11/16/18 1032 11/16/18 1045  BP: 134/68 (!) 144/67  Pulse: 77 81  Resp: 14 18  Temp:    SpO2: 100% 99%    Last Pain:  Vitals:   11/16/18 1045  TempSrc:   PainSc: 0-No pain                 Audry Pili

## 2018-11-16 NOTE — Op Note (Signed)
    NAME: [name]    MRN: [id number] DOB: [birth year]    DATE OF OPERATION: 11/16/2018  PREOP DIAGNOSIS:    Stage IV chronic kidney disease  POSTOP DIAGNOSIS:    Same  PROCEDURE:    1.  Right brachiocephalic AV fistula 2.  Superficialization of right brachiocephalic AV fistula  SURGEON: Judeth Cornfield. Scot Dock, MD  ASSIST: Graylon Good, RNFA  ANESTHESIA: Local with sedation  EBL: Minimal  INDICATIONS:    Joann Gutierrez is a 73 y.o. female who is not yet on dialysis.  She presents for placement of a fistula.  It looks like her best chance with a right brachiocephalic fistula.  FINDINGS:   Excellent thrill in her fistula at the completion of the procedure with a palpable right radial pulse.  The vein was quite deep however so I did make an additional incision and superficialize the proximal two thirds of the fistula  TECHNIQUE:   The patient was taken to the operating room and sedated by anesthesia.  The right upper extremity was prepped and draped in usual sterile fashion.  After the skin was anesthetized with 1% lidocaine a transverse incision was made at the antecubital level.  Here the cephalic vein was dissected free with branches divided between clips and 3-0 silk ties.  The vein was ligated distally and irrigated up nicely with heparinized saline.  It was a 3.5 mm vein.  Branches were divided between clips and 3-0 silk ties.  The brachial artery was dissected free beneath the fascia.  The patient was heparinized.  The brachial artery was clamped proximally and distally and a longitudinal arteriotomy was made.  The vein was sewn end-to-side to the artery using continuous 6-0 Prolene suture.  There was an excellent thrill at the completion with a palpable radial pulse.  The vein was quite deep.  This reason I excised a large amount of adipose tissue overlying the vein and freed it up circumferentially releasing the fascia.  A separate longitudinal incision was  made in the mid upper arm and again the vein was dissected free circumferentially with branches divided between clips and 3-0 silk ties.  It was fully mobilized, the fascia was opened and a large amount of adipose tissue was excised above the fistula.  Hemostasis was obtained in the wounds.  Each of the wounds was closed with a deep layer of 3-0 Vicryl the skin closed with 4-0 Vicryl.  Dermabond was applied.  The patient tolerated procedure well was transferred to the recovery room in stable condition.  All needle and sponge counts were correct.  Deitra Mayo, MD, FACS [facility]  DATE OF DICTATION:   11/16/2018

## 2018-11-16 NOTE — Transfer of Care (Signed)
Immediate Anesthesia Transfer of Care Note  Patient: Joann Gutierrez  Procedure(s) Performed: CREATION RIGHT BRACHIOCEPHALIC FISTULA  ARTERIOVENOUS FISTULA (Right Arm Upper) Fistula Superficialization (Right Arm Upper)  Patient Location: PACU  Anesthesia Type:MAC  Level of Consciousness: awake, alert  and oriented  Airway & Oxygen Therapy: Patient Spontanous Breathing and Patient connected to face mask oxygen  Post-op Assessment: Report given to RN and Post -op Vital signs reviewed and stable  Post vital signs: Reviewed and stable  Last Vitals:  Vitals Value Taken Time  BP 126/51 11/16/18 0932  Temp    Pulse 78 11/16/18 0934  Resp 19 11/16/18 0934  SpO2 100 % 11/16/18 0934  Vitals shown include unvalidated device data.  Last Pain:  Vitals:   11/16/18 0646  TempSrc: Oral  PainSc: 0-No pain         Complications: No apparent anesthesia complications

## 2018-11-17 ENCOUNTER — Encounter (HOSPITAL_COMMUNITY): Payer: Self-pay | Admitting: Vascular Surgery

## 2018-11-20 DIAGNOSIS — N184 Chronic kidney disease, stage 4 (severe): Secondary | ICD-10-CM | POA: Diagnosis not present

## 2018-11-20 DIAGNOSIS — D631 Anemia in chronic kidney disease: Secondary | ICD-10-CM | POA: Diagnosis not present

## 2018-11-20 DIAGNOSIS — M9080 Osteopathy in diseases classified elsewhere, unspecified site: Secondary | ICD-10-CM | POA: Diagnosis not present

## 2018-11-20 DIAGNOSIS — I129 Hypertensive chronic kidney disease with stage 1 through stage 4 chronic kidney disease, or unspecified chronic kidney disease: Secondary | ICD-10-CM | POA: Diagnosis not present

## 2018-11-20 DIAGNOSIS — E889 Metabolic disorder, unspecified: Secondary | ICD-10-CM | POA: Diagnosis not present

## 2018-11-23 DIAGNOSIS — Z79899 Other long term (current) drug therapy: Secondary | ICD-10-CM | POA: Diagnosis not present

## 2018-11-23 DIAGNOSIS — N183 Chronic kidney disease, stage 3 unspecified: Secondary | ICD-10-CM | POA: Diagnosis not present

## 2018-11-23 DIAGNOSIS — Z1159 Encounter for screening for other viral diseases: Secondary | ICD-10-CM | POA: Diagnosis not present

## 2018-11-24 ENCOUNTER — Telehealth: Payer: Self-pay

## 2018-11-24 NOTE — Telephone Encounter (Signed)
Per Dr. Loanne Drilling, unable to complete Rx order for glucose testing supplies for [facility] without an appt. Routing this message to the front desk for scheduling purposes.

## 2018-11-26 NOTE — Telephone Encounter (Signed)
Patient has not been seen since 2014. Patient has been informed that since it has been over 3 years a new Referral is needed.

## 2018-11-29 IMAGING — CR DG CHEST 2V
2 series · 2 of 2 positions shown · non-contrast
Comparison: 08/12/2017

CLINICAL DATA: Dyspnea

EXAM:
CHEST - 2 VIEW

[chest lat]
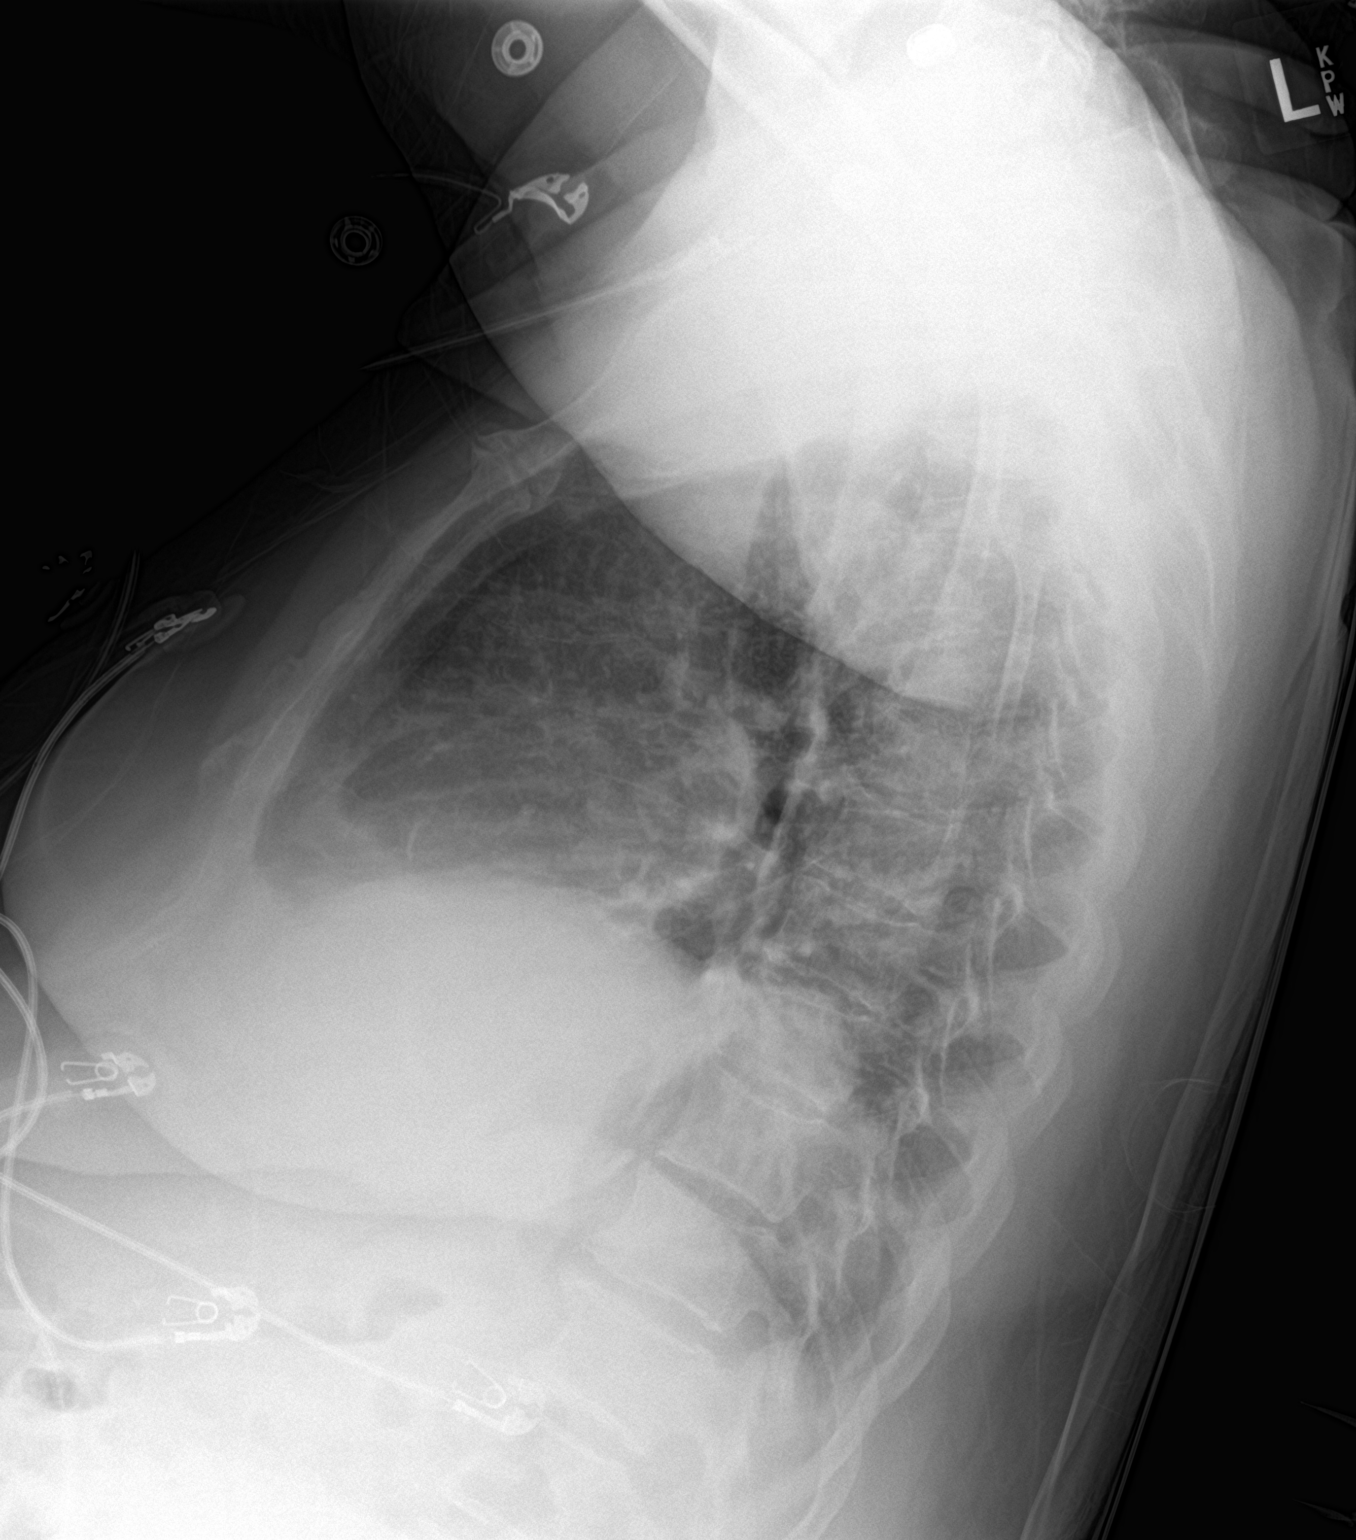

[chest ap]
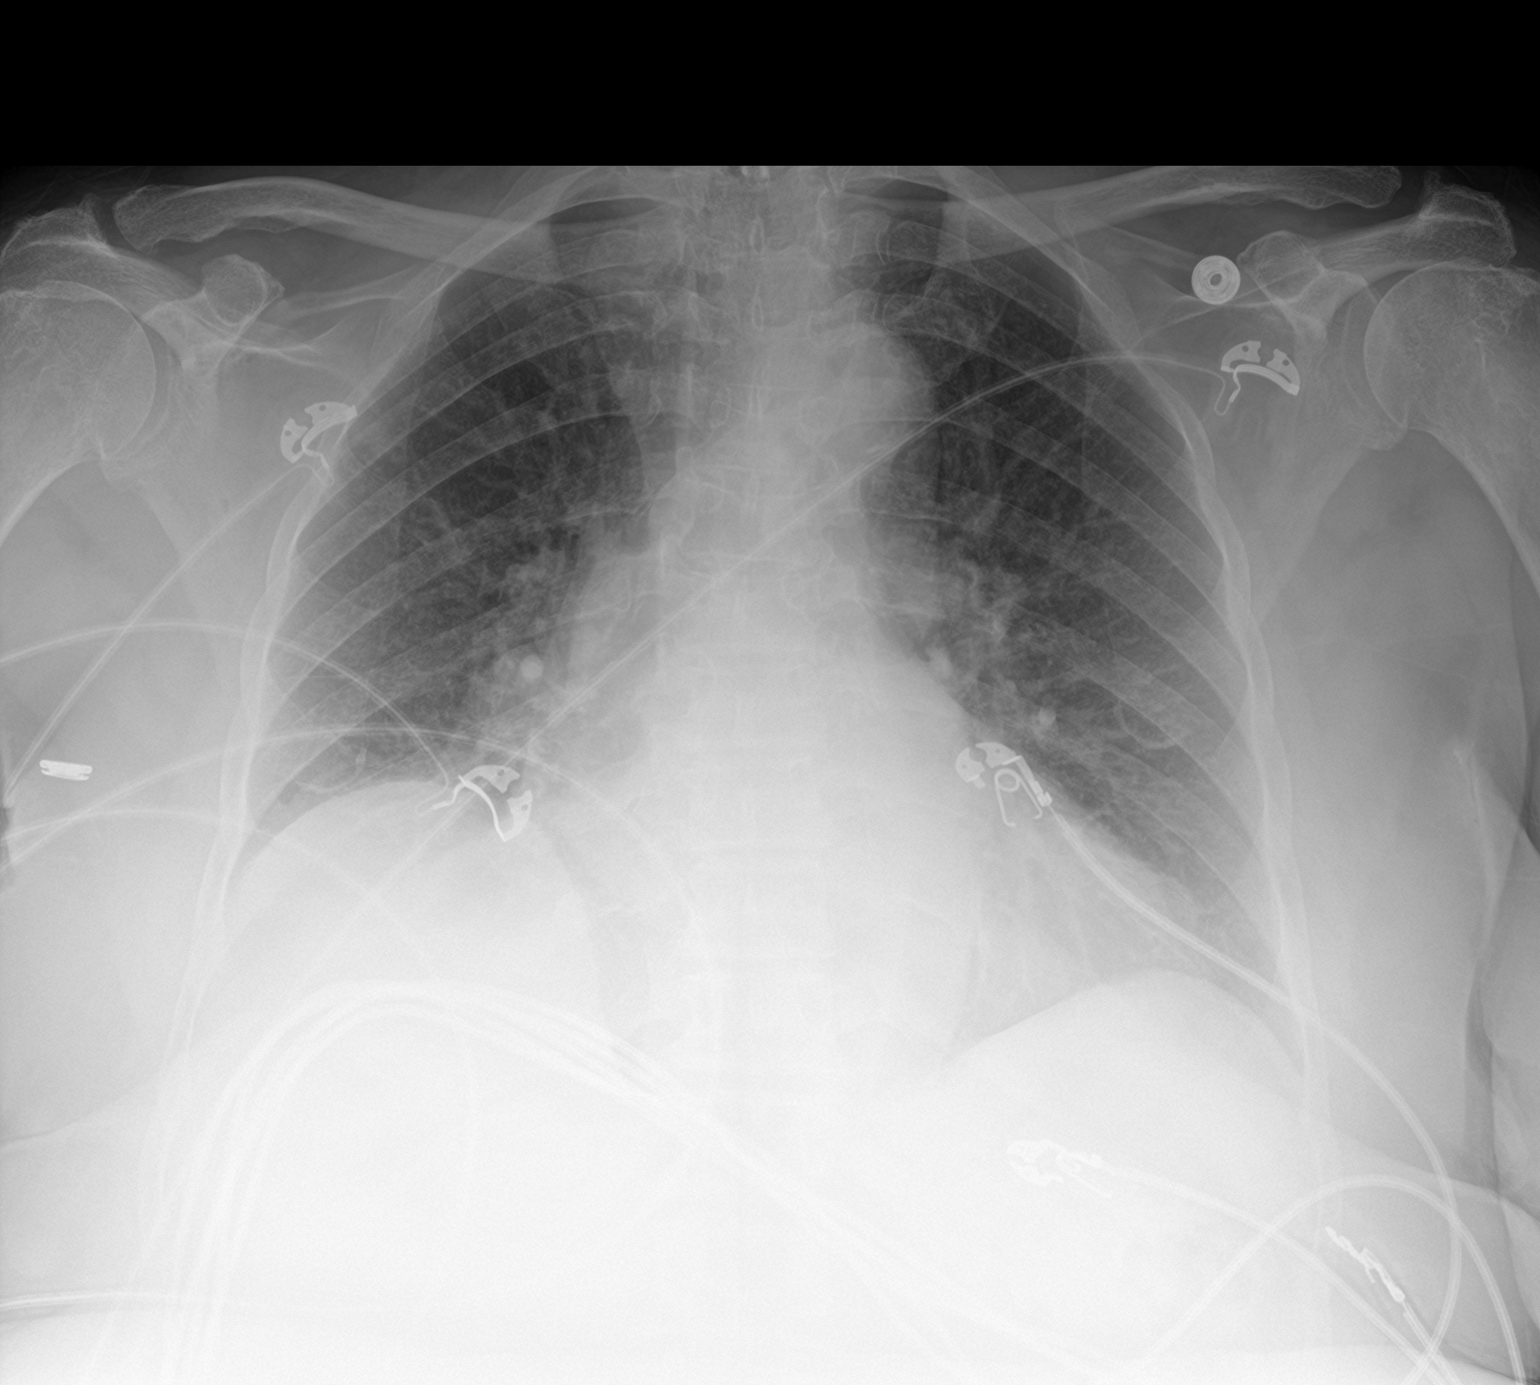

[2 of 2 positions shown; findings below may reference images not displayed]

FINDINGS: Cardiomegaly with vascular congestion. Chronic elevation of right
diaphragm. No pleural effusion. No focal airspace disease.
IMPRESSION: Cardiomegaly with vascular congestion.

## 2018-11-29 IMAGING — CT CT ABD-PELV W/ CM
2 of 5 series · 16 of 46 positions shown, 18 images · IV contrast (APPLIED)
Comparison: 12/14/2015

CLINICAL DATA: Abdominal pain palpable subcutaneous nodules

EXAM:
CT ABDOMEN AND PELVIS WITH CONTRAST
TECHNIQUE: Multidetector CT imaging of the abdomen and pelvis was performed
using the standard protocol following bolus administration of
intravenous contrast.
CONTRAST:  100mL OMNIPAQUE IOHEXOL 300 MG/ML  SOLN

[Series 3: abdomen 5.0 · axial · 0.98mm/px · z∈[+825,+1260]mm · 13 of 103 slices shown, 15 images]
[im 8/103  soft-tissue]
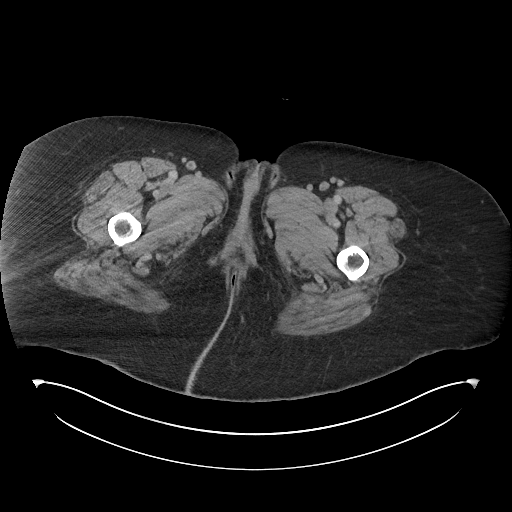
[im 8/103  bone]
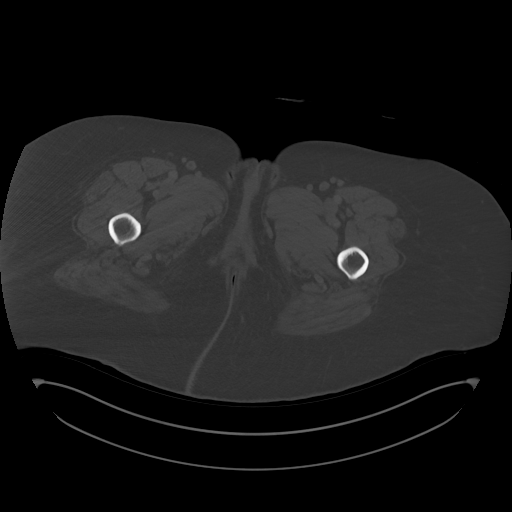
[im 15/103  soft-tissue]
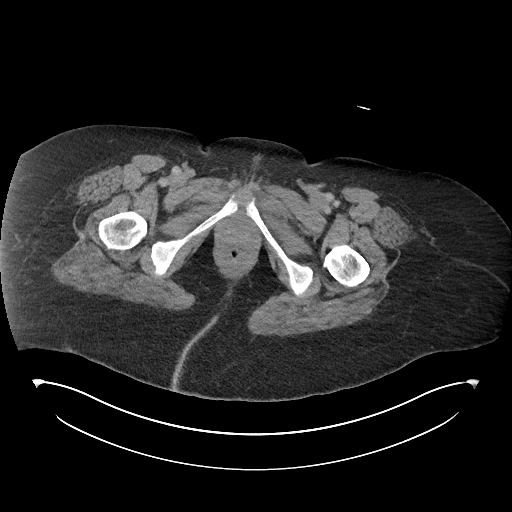
[im 22/103  soft-tissue]
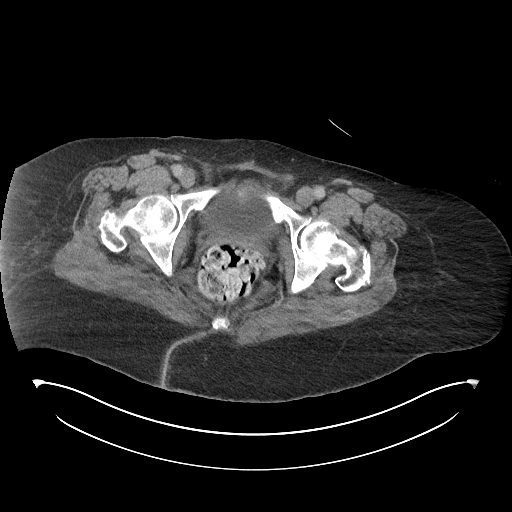
[im 30/103  soft-tissue]
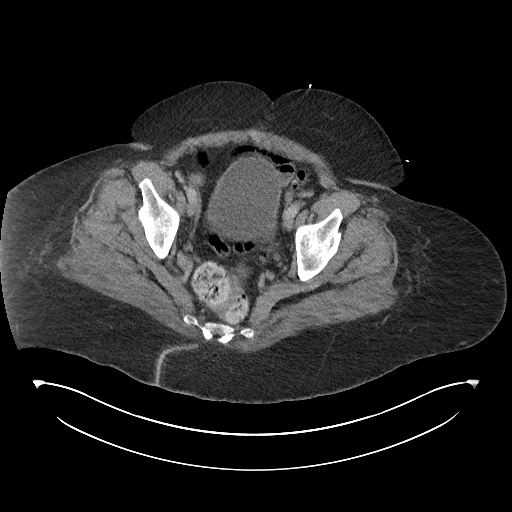
[im 37/103  soft-tissue]
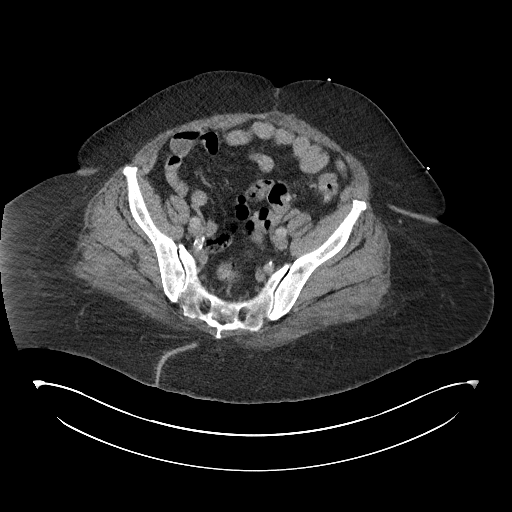
[im 44/103  soft-tissue]
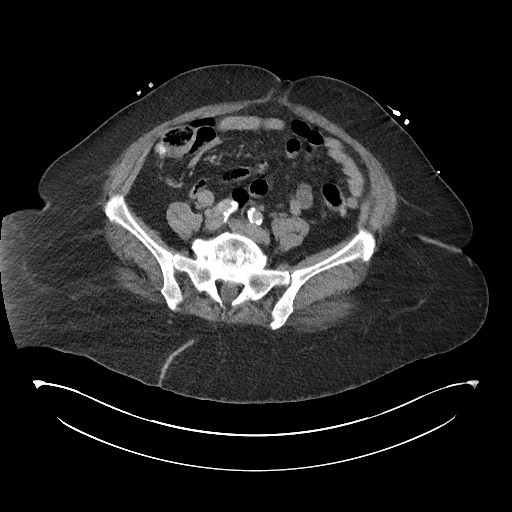
[im 52/103  soft-tissue]
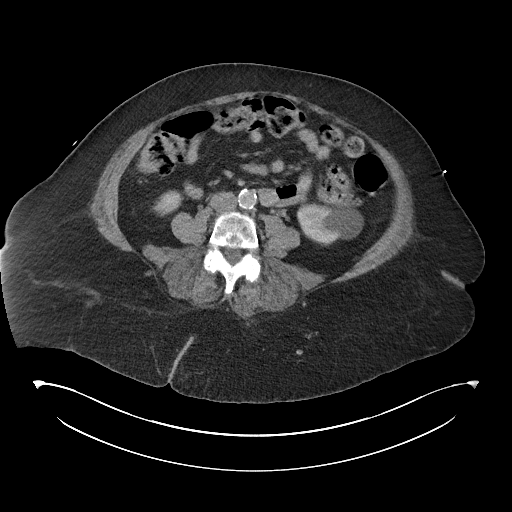
[im 59/103  soft-tissue]
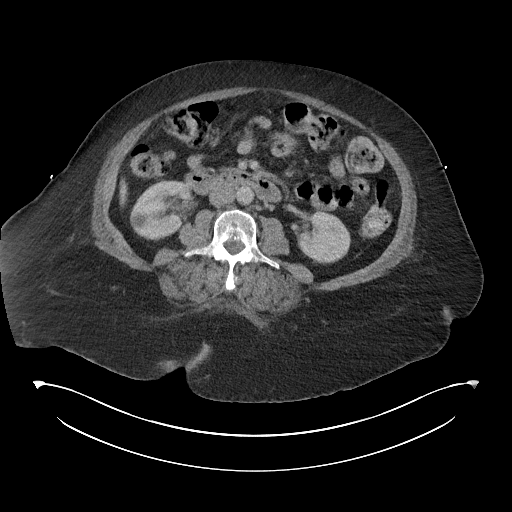
[im 66/103  soft-tissue]
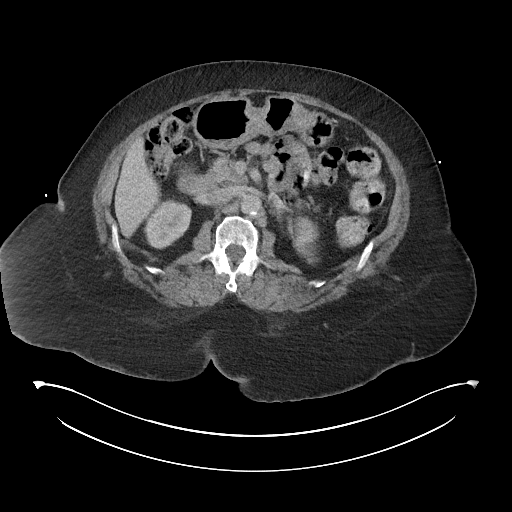
[im 66/103  bone]
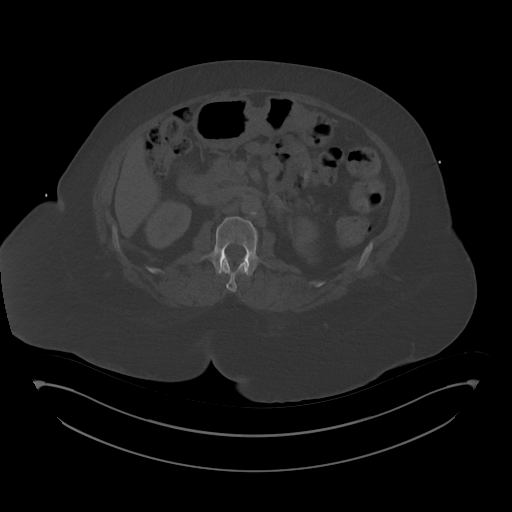
[im 73/103  soft-tissue]
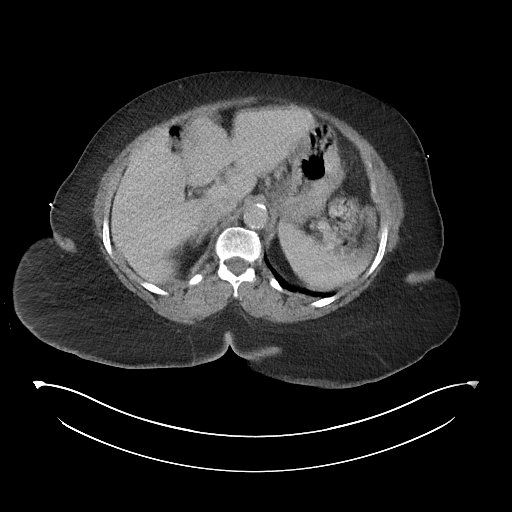
[im 81/103  soft-tissue]
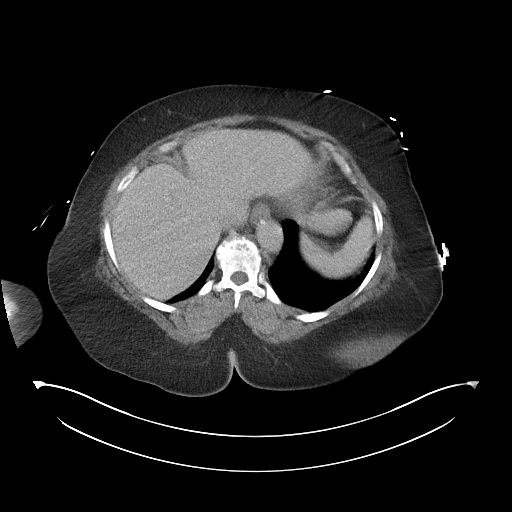
[im 88/103  soft-tissue]
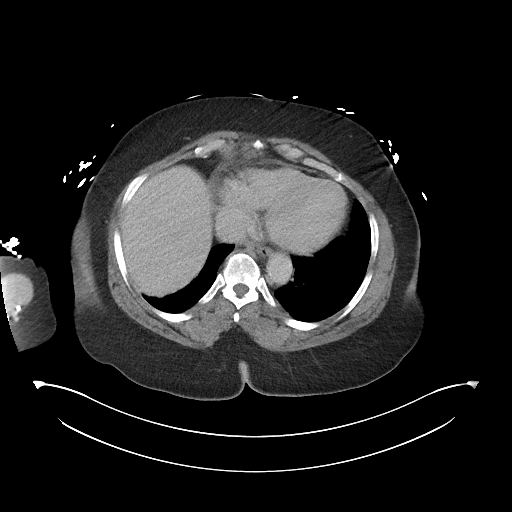
[im 95/103  soft-tissue]
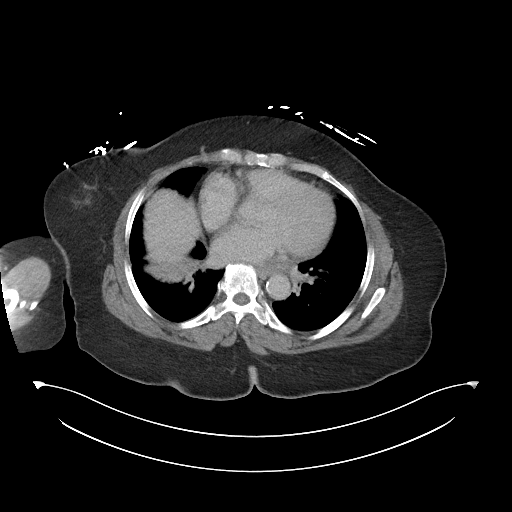

[Series 5: abdomen 3.0 mpr cor · coronal · 0.94mm/px · 3 of 109 slices shown]
[im 37/109  soft-tissue]
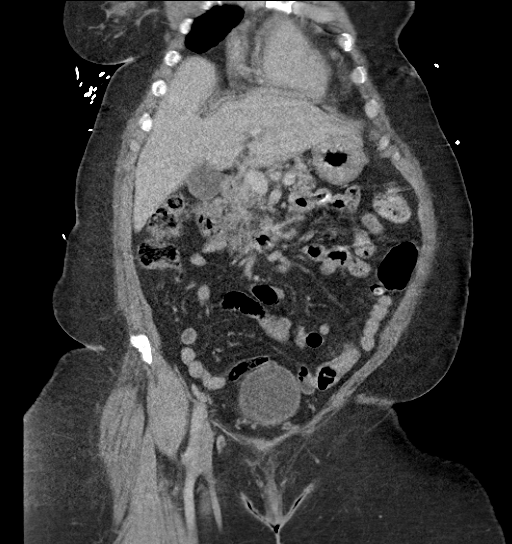
[im 49/109  soft-tissue]
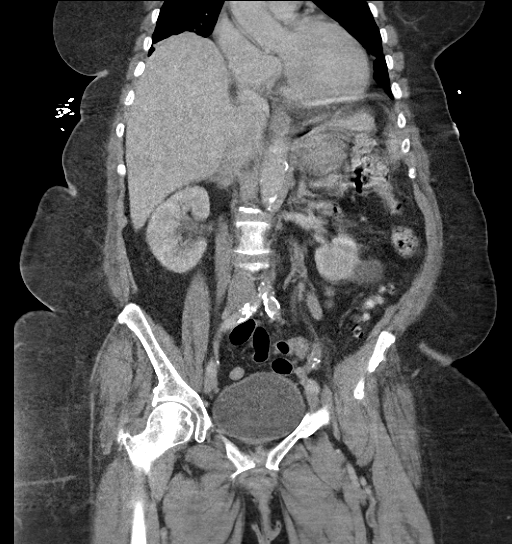
[im 61/109  soft-tissue]
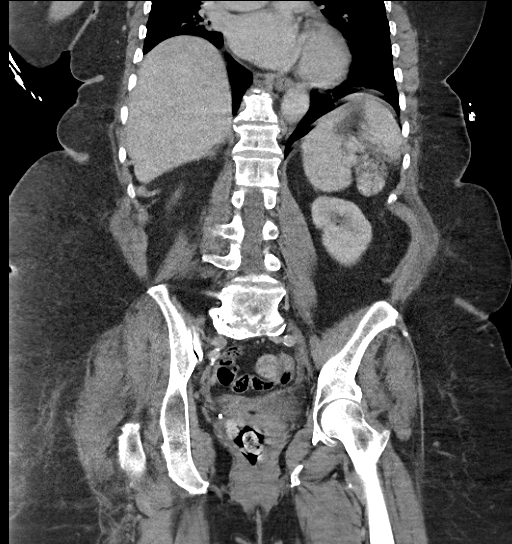

[16 of 46 positions shown; findings below may reference images not displayed]

FINDINGS: Lower chest: Lung bases are well aerated with the exception of some
stable right lower lobe scarring. Mild cardiomegaly is noted.

Hepatobiliary: No focal liver abnormality is seen. No gallstones,
gallbladder wall thickening, or biliary dilatation.

Pancreas: Unremarkable. No pancreatic ductal dilatation or
surrounding inflammatory changes.

Spleen: Normal in size without focal abnormality.

Adrenals/Urinary Tract: Adrenal glands are within normal limits. The
kidneys are well visualized bilaterally. No obstructive changes or
renal calculi are noted. The bladder is partially distended.
Bilateral stable renal cysts are noted.

Stomach/Bowel: Diverticular change of the colon is noted without
evidence of diverticulitis. The appendix is not well visualized. No
inflammatory changes to suggest appendicitis are noted. No
obstructive changes are seen.

Vascular/Lymphatic: Aortic atherosclerosis. No enlarged abdominal or
pelvic lymph nodes.

Reproductive: Status post hysterectomy. No adnexal masses.

Other: No abdominal wall hernia or abnormality. No abdominopelvic
ascites.

Musculoskeletal: Degenerative changes of lumbar spine are noted.
IMPRESSION: Diverticulosis without diverticulitis.

Stable renal cysts.

No acute abnormality noted.

## 2018-11-30 ENCOUNTER — Encounter (HOSPITAL_COMMUNITY): Payer: Medicare Other

## 2018-11-30 IMAGING — CT CT ABD-PELV W/O CM
2 of 4 series · 17 of 46 positions shown, 19 images · non-contrast
Comparison: 09/04/2017 CT abdomen and pelvis.

CLINICAL DATA: 72 y/o  F; abdominal distention.

EXAM:
CT ABDOMEN AND PELVIS WITHOUT CONTRAST
TECHNIQUE: Multidetector CT imaging of the abdomen and pelvis was performed
following the standard protocol without IV contrast.

[Series 3: abd/ pelvis 5.0 i30f 2 · axial · 0.98mm/px · z∈[+1052,+1502]mm · 14 of 100 slices shown, 16 images]
[im 5/100  soft-tissue]
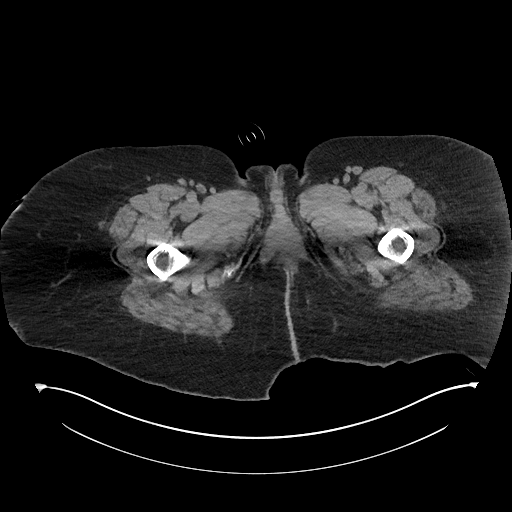
[im 5/100  bone]
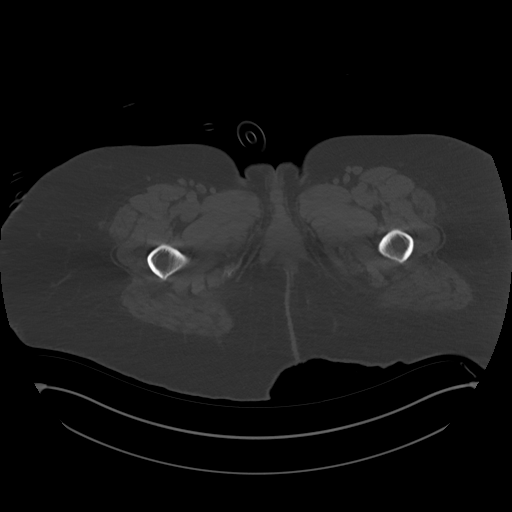
[im 13/100  soft-tissue]
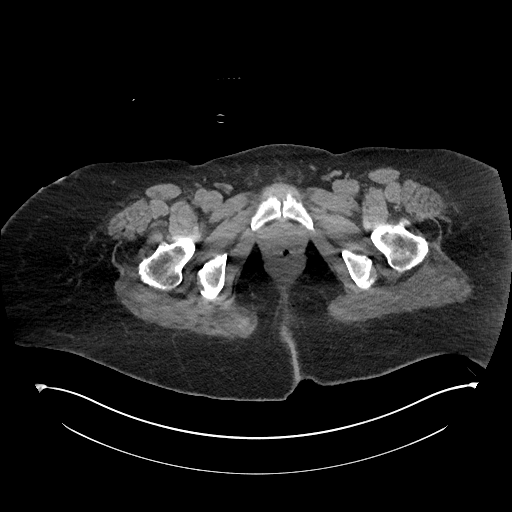
[im 21/100  soft-tissue]
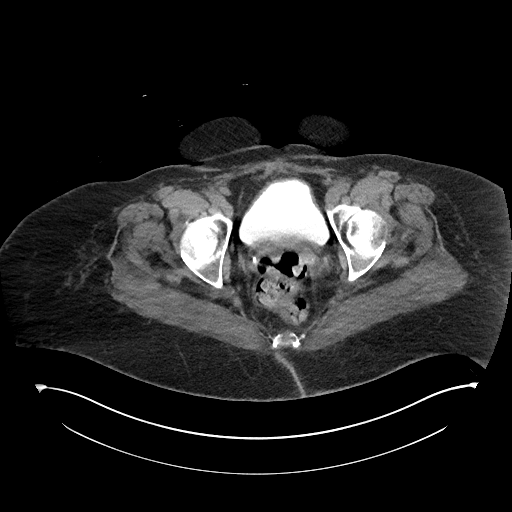
[im 25/100  soft-tissue]
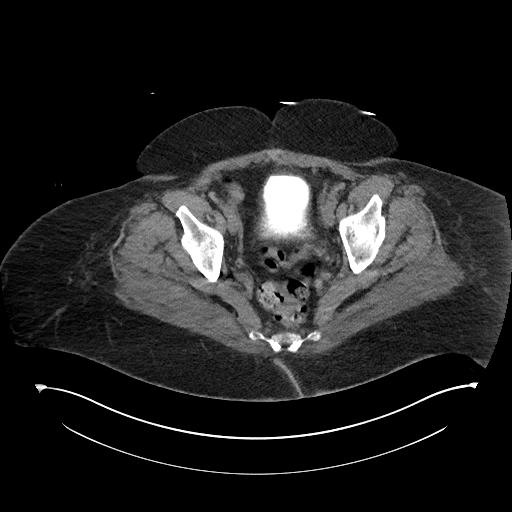
[im 34/100  soft-tissue]
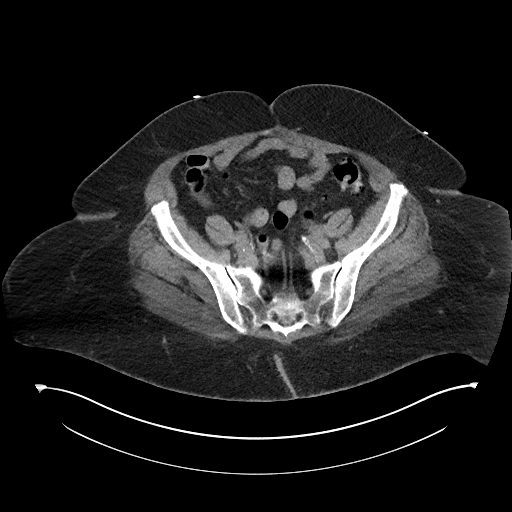
[im 42/100  soft-tissue]
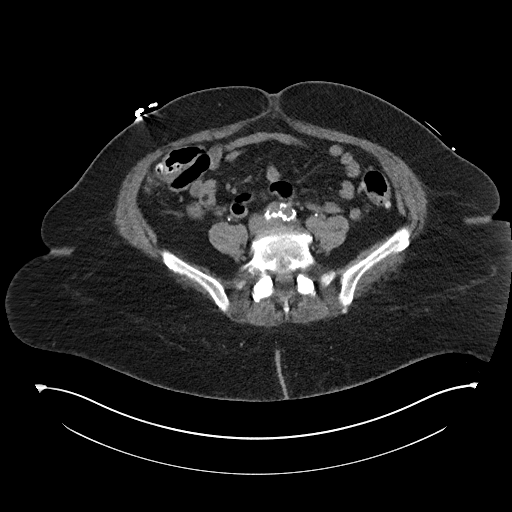
[im 46/100  soft-tissue]
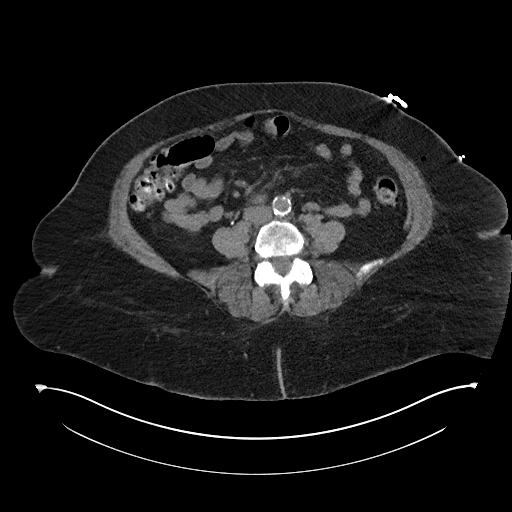
[im 54/100  soft-tissue]
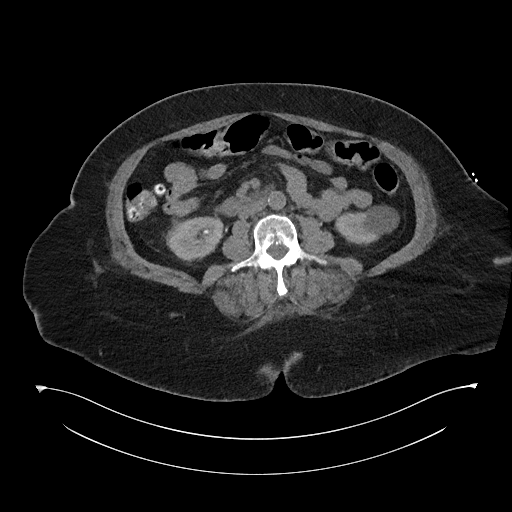
[im 58/100  soft-tissue]
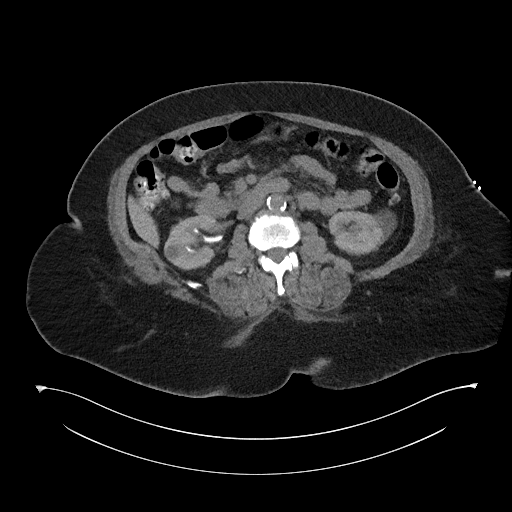
[im 58/100  bone]
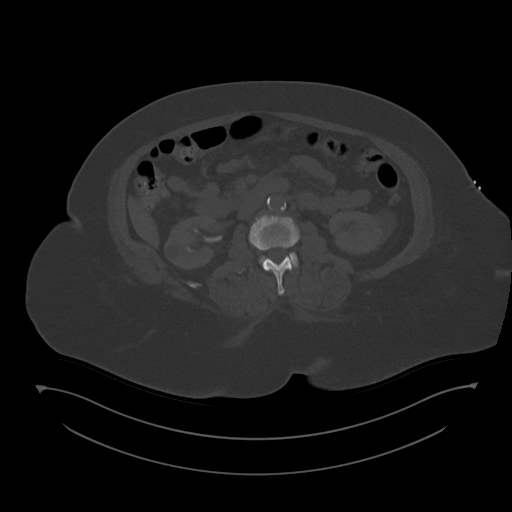
[im 67/100  soft-tissue]
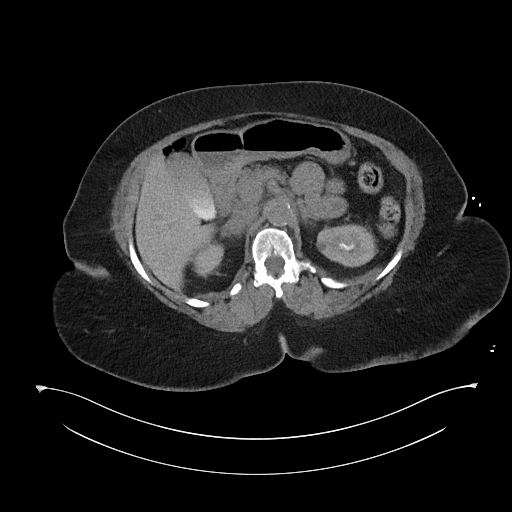
[im 75/100  soft-tissue]
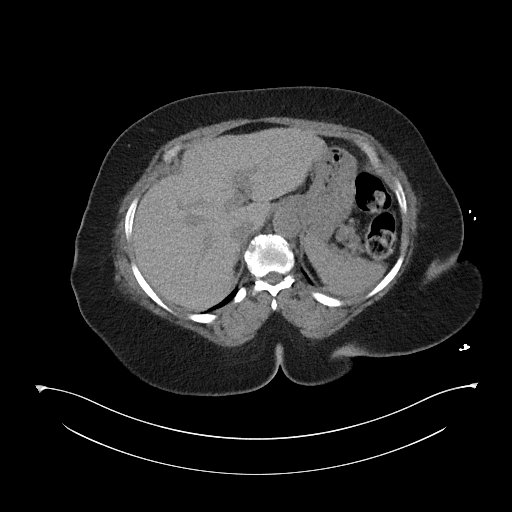
[im 79/100  soft-tissue]
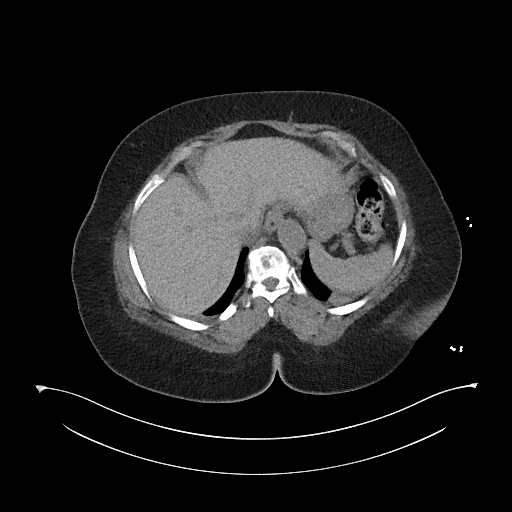
[im 87/100  soft-tissue]
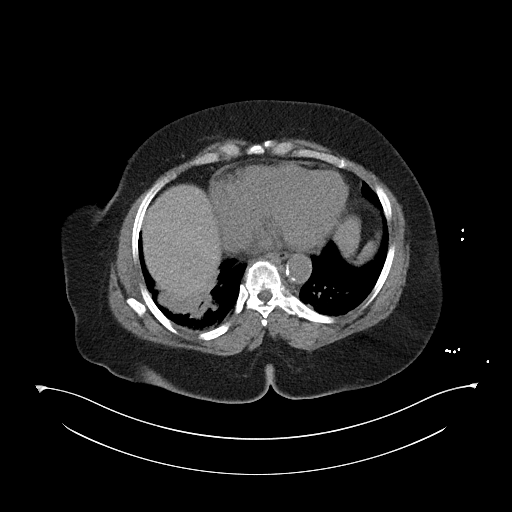
[im 95/100  soft-tissue]
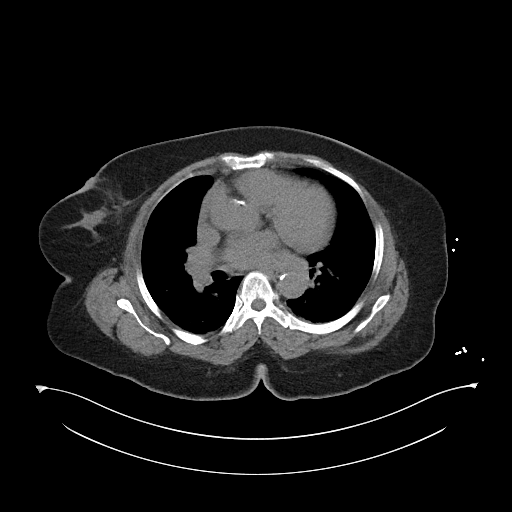

[Series 6: cor st · coronal · 0.94mm/px · 3 of 112 slices shown]
[im 38/112  soft-tissue]
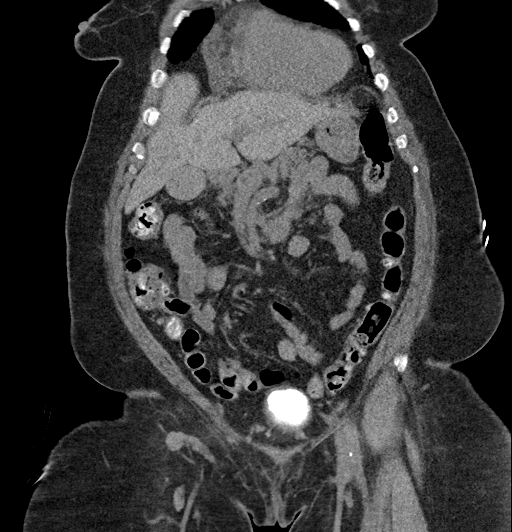
[im 50/112  soft-tissue]
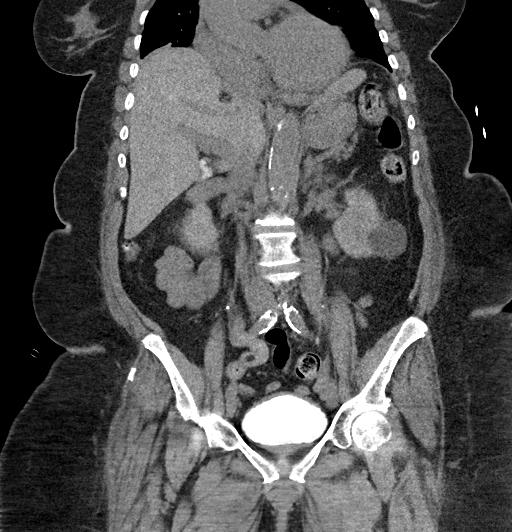
[im 62/112  soft-tissue]
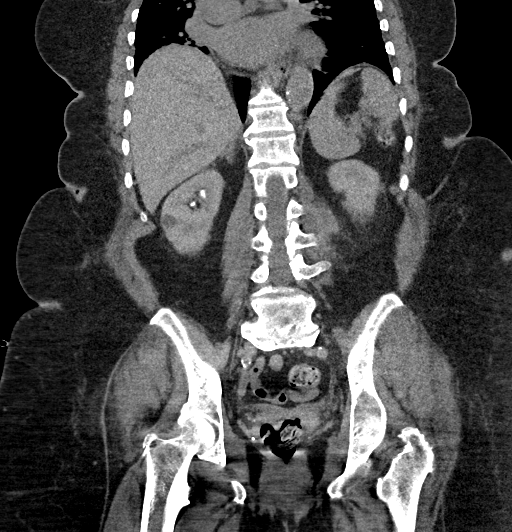

[17 of 46 positions shown; findings below may reference images not displayed]

FINDINGS: Lower chest: Mild cardiomegaly. Small focus of consolidation within
the basilar right lower lobe may represent atelectasis or pneumonia.

Hepatobiliary: No focal liver abnormality is seen. No gallstones,
gallbladder wall thickening, or biliary dilatation. Excretion of
contrast into the gallbladder.

Pancreas: Unremarkable. No pancreatic ductal dilatation or
surrounding inflammatory changes.

Spleen: Normal in size without focal abnormality.

Adrenals/Urinary Tract: Adrenal glands are unremarkable. Multiple
renal cysts measuring up to 3.9 cm in the left kidney lower pole.
Otherwise kidneys are normal, without renal calculi, focal lesion,
or hydronephrosis. Bladder is unremarkable. Contrast excretion into
the renal collecting systems.

Stomach/Bowel: Stomach is within normal limits. Appendix not
identified, no pericecal inflammation. No evidence of bowel wall
thickening, distention, or inflammatory changes. Diverticulosis
without findings of acute diverticulitis.

Vascular/Lymphatic: Aortic atherosclerosis. No enlarged abdominal or
pelvic lymph nodes.

Reproductive: Status post hysterectomy. No adnexal masses.

Other: No abdominal wall hernia or abnormality. No abdominopelvic
ascites.

Musculoskeletal: No fracture is seen.
IMPRESSION: 1. Small focus of consolidation within the basilar right lower lobe
may represent atelectasis or pneumonia.
2. No acute process of the abdomen and pelvis identified.
3. Mild cardiomegaly.
4. Diverticulosis without findings of acute diverticulitis.
5. Aortic atherosclerosis.

By: Knisja Santu Rokku Stafrace M.D.

## 2018-11-30 IMAGING — CT CT HEAD CODE STROKE
3 of 4 series · 13 of 47 positions shown, 15 images · non-contrast
Comparison: Brain MRI 08/12/2017. Head CT without contrast
10/24/2016, and earlier.

CLINICAL DATA: Code stroke. 72-year-old female with altered mental
status.

EXAM:
CT HEAD WITHOUT CONTRAST
TECHNIQUE: Contiguous axial images were obtained from the base of the skull
through the vertex without intravenous contrast.

[Series 3: head wo · axial · 0.42mm/px · z∈[-178,-58]mm · 7 of 32 slices shown, 9 images]
[im 4/32  brain]
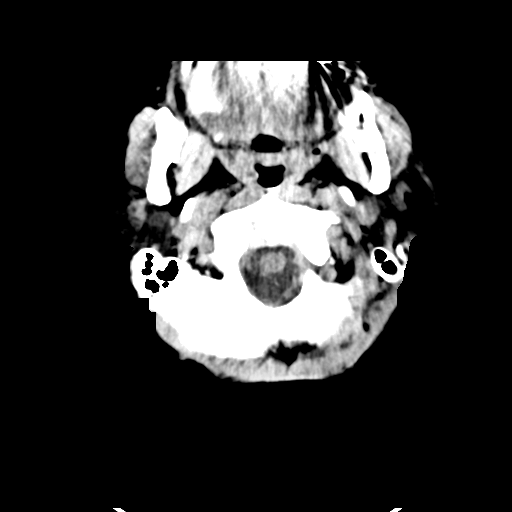
[im 4/32  bone]
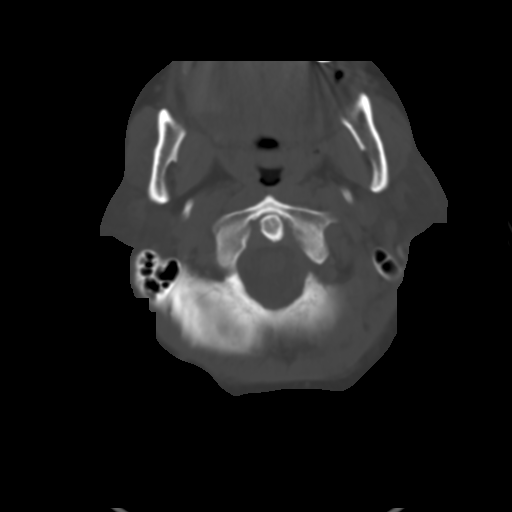
[im 8/32  brain]
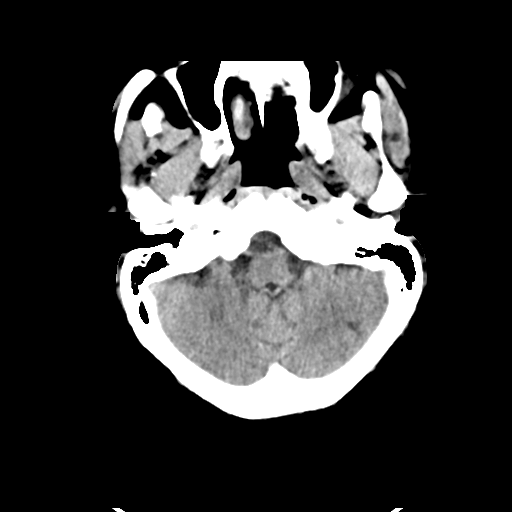
[im 12/32  brain]
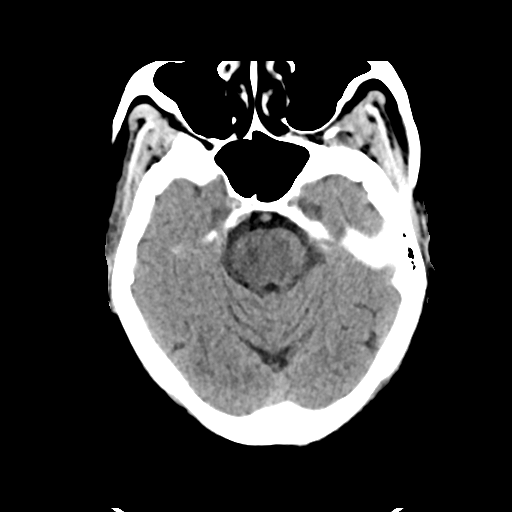
[im 16/32  brain]
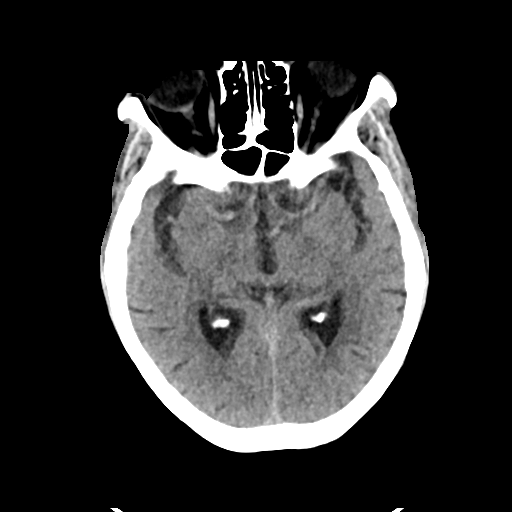
[im 20/32  brain]
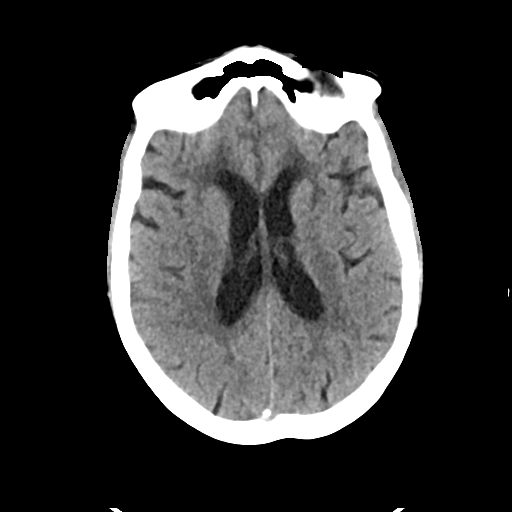
[im 20/32  bone]
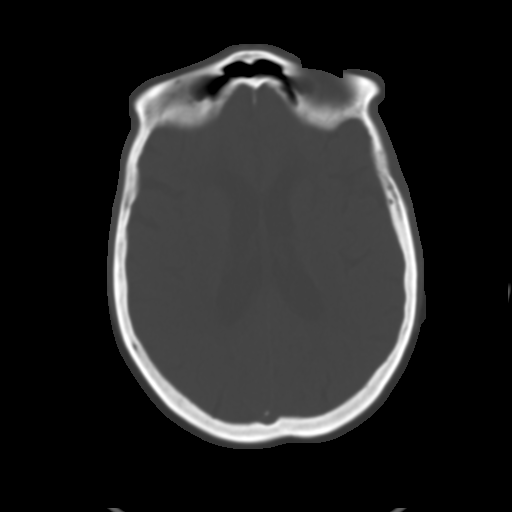
[im 24/32  brain]
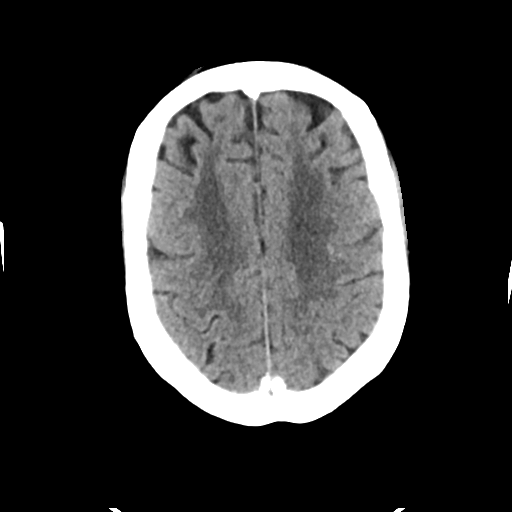
[im 28/32  brain]
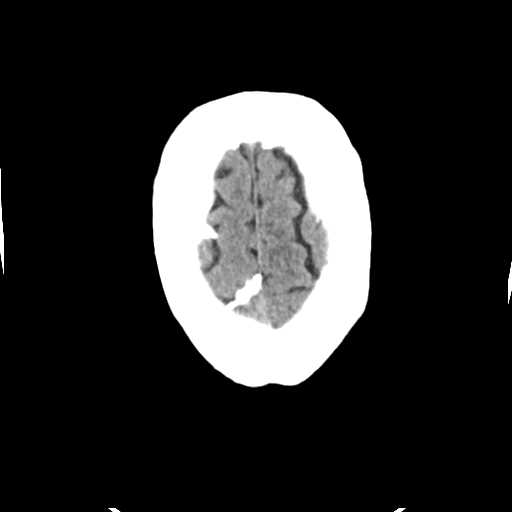

[Series 5: cor soft · coronal · 0.31mm/px · 3 of 67 slices shown]
[im 23/67  brain]
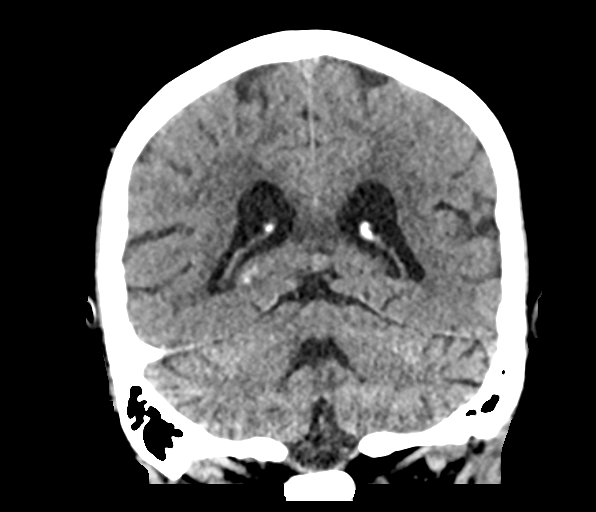
[im 30/67  brain]
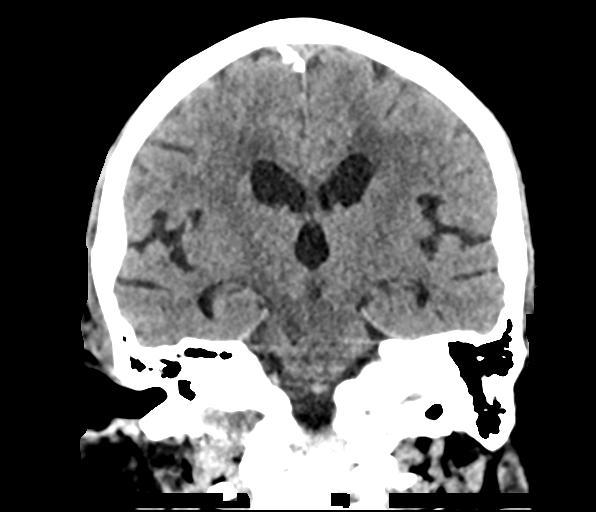
[im 37/67  brain]
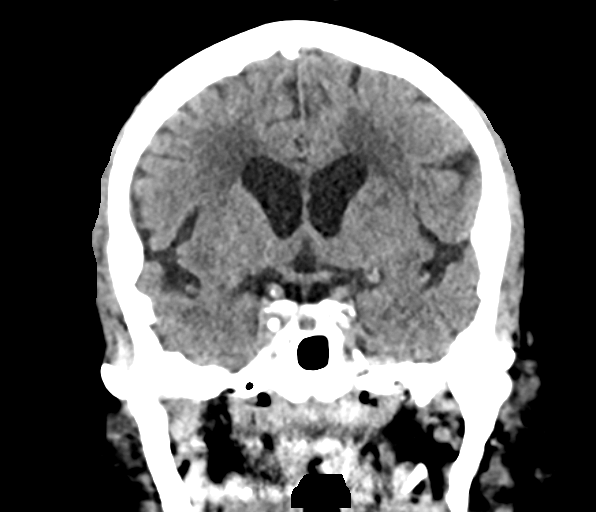

[Series 6: sag soft · sagittal · 0.31mm/px · 3 of 67 slices shown]
[im 23/67  brain]
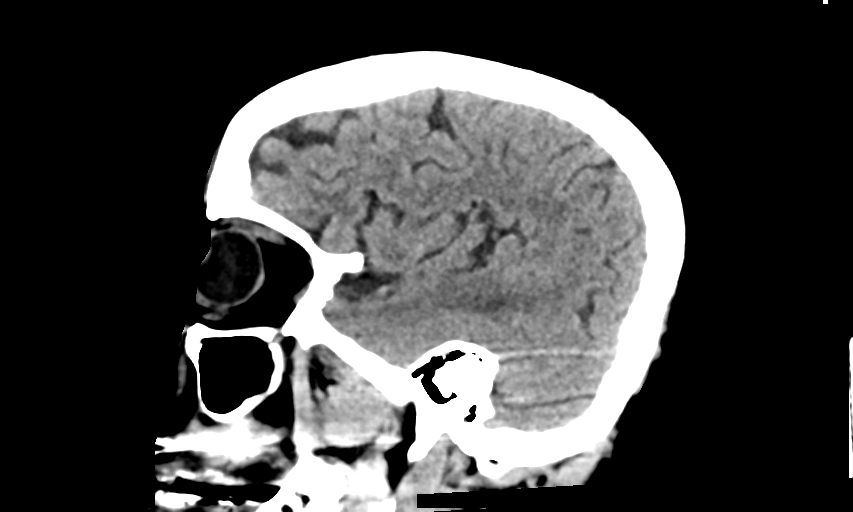
[im 34/67  brain]
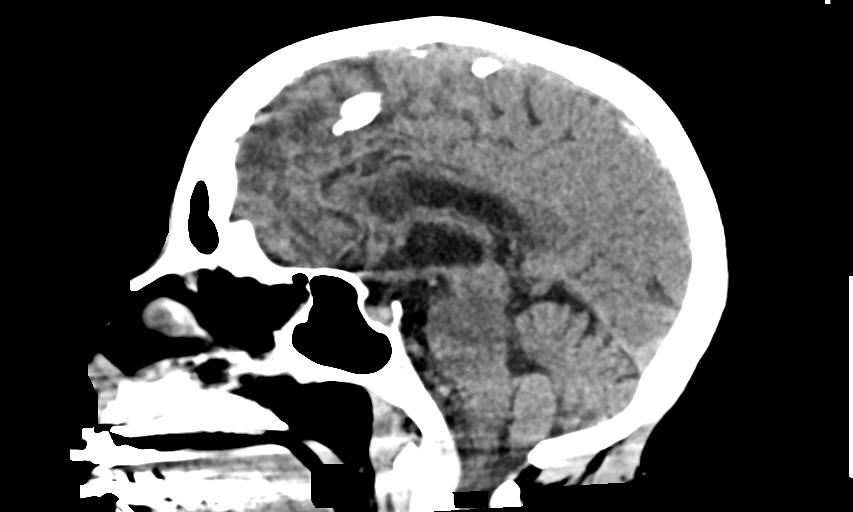
[im 45/67  brain]
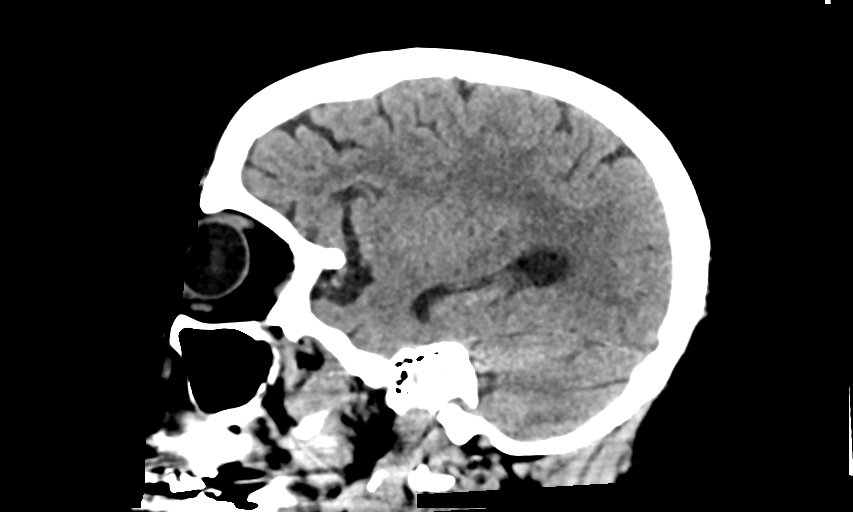

[13 of 47 positions shown; findings below may reference images not displayed]

FINDINGS: Brain: Stable cerebral volume since 4359. Patchy bilateral cerebral
white matter hypodensity is stable. Small chronic infarct in the
left lower cerebellum redemonstrated. Small chronic left thalamic
lacune is evident.

Questionable new small area of hypodensity in the right pons (series
3, image 12), but could be artifact.

No midline shift, ventriculomegaly, mass effect, evidence of mass
lesion, intracranial hemorrhage or evidence of cortically based
acute infarction.

Vascular: Calcified atherosclerosis at the skull base. No suspicious
intracranial vascular hyperdensity.

Skull: Stable, negative.

Sinuses/Orbits: Visualized paranasal sinuses and mastoids are stable
and well pneumatized.

Other: Chronic postoperative changes to the left globe. No acute
orbit or scalp soft tissue findings. Negative visible noncontrast
deep soft tissue spaces of the face.

ASPECTS (Alberta Stroke Program Early CT Score)

- Ganglionic level infarction (caudate, lentiform nuclei, internal
capsule, insula, M1-M3 cortex): 7

- Supraganglionic infarction (M4-M6 cortex): 3

Total score (0-10 with 10 being normal): 10
IMPRESSION: 1. Questionable new hypodense lacune in the right pons, but may be
artifact.
2. Otherwise stable chronic small vessel ischemia. No acute
intracranial hemorrhage or cortically based infarct.
3. ASPECTS is 10.
4. These results were communicated to Dr. Denkata at [DATE] pmon
09/05/2017by text page via the [employer] messaging system.

## 2018-11-30 IMAGING — MR MR HEAD W/O CM
14 of 15 series · 45 of 48 positions shown · non-contrast
Comparison: Head CT 09/05/2017.  Brain MRI 08/12/2017.

CLINICAL DATA: Stroke follow-up. Altered mental status. Hemiparesis
and facial droop.

EXAM:
MRI HEAD WITHOUT CONTRAST
TECHNIQUE: Multiplanar, multiecho pulse sequences of the brain and surrounding
structures were obtained without intravenous contrast.

[Series 5: ax dwi_tracew · axial · 3.0mm · 1.50mm/px · z∈[-7,+127]mm · 6 of 92 slices shown]
[im 1/92]
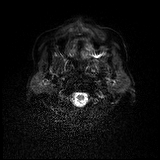
[im 19/92]
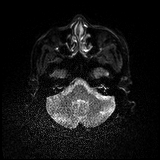
[im 37/92]
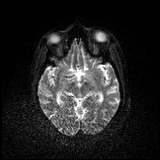
[im 55/92]
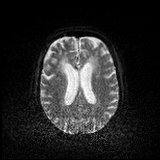
[im 73/92]
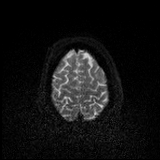
[im 92/92]
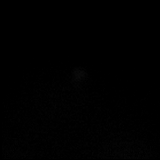

[Series 6: ax dwi_adc · axial · 3.0mm · 1.50mm/px · z∈[-7,+124]mm · 3 of 45 slices shown]
[im 1/45]
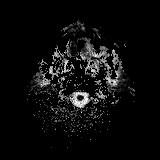
[im 23/45]
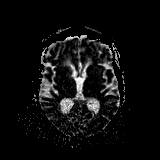
[im 45/45]
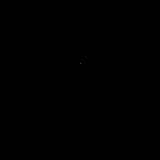

[Series 11: DWI · axial · 4.0mm · 0.88mm/px · z∈[-60,+76]mm · 5 of 70 slices shown (1 of 4)]
[im 1/70]
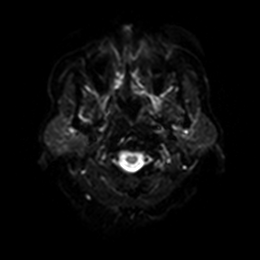
[im 18/70]
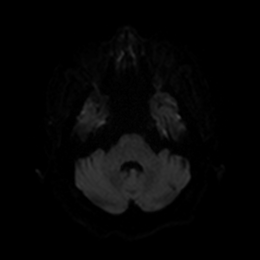
[im 35/70]
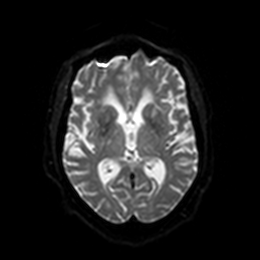
[im 52/70]
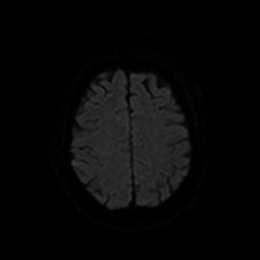
[im 70/70]
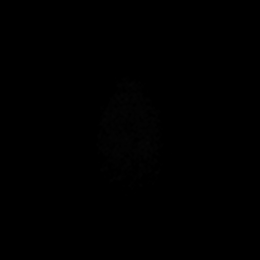

[Series 12: DWI · axial · 4.0mm · 0.88mm/px · z∈[-60,+76]mm · 2 of 35 slices shown (2 of 4)]
[im 1/35]
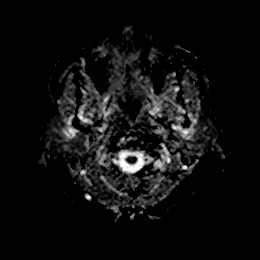
[im 35/35]
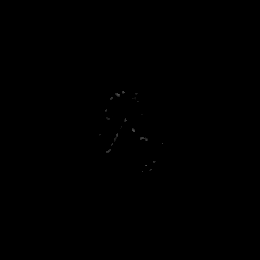

[Series 13: FLAIR · axial · 5.0mm · 0.45mm/px · z∈[-72,+71]mm · 2 of 25 slices shown]
[im 1/25]
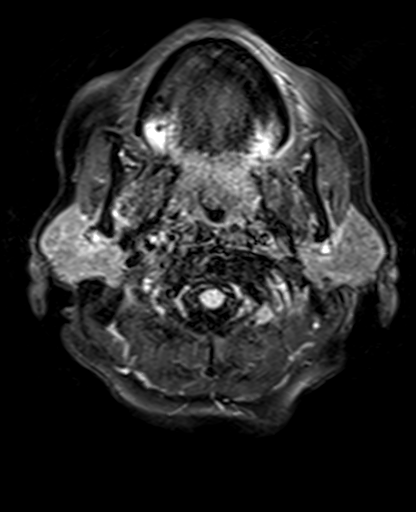
[im 25/25]
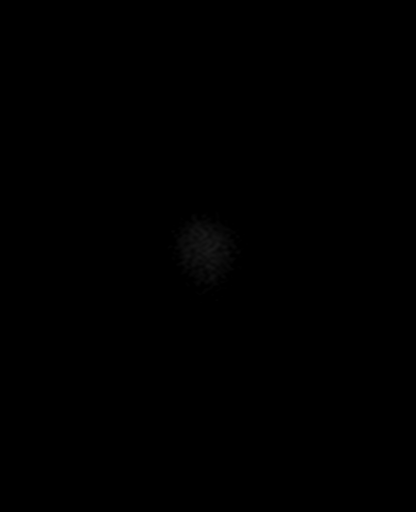

[Series 14: cor dwi_tracew · coronal · 3.0mm · 1.44mm/px · 6 of 88 slices shown]
[im 1/88]
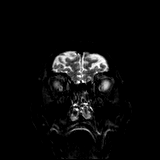
[im 18/88]
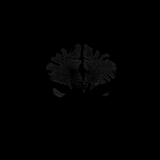
[im 35/88]
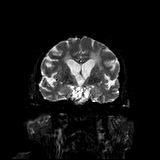
[im 53/88]
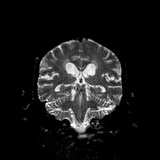
[im 70/88]
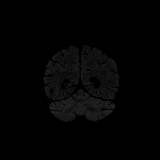
[im 88/88]
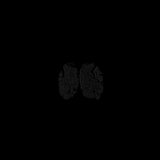

[Series 15: cor dwi_adc · coronal · 3.0mm · 1.44mm/px · 3 of 44 slices shown]
[im 1/44]
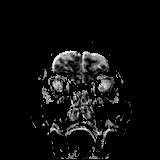
[im 22/44]
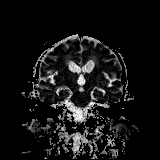
[im 44/44]
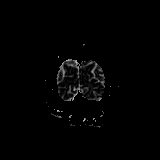

[Series 16: DWI · axial · 2.0mm · 0.88mm/px · z∈[-58,-10]mm · 4 of 50 slices shown (3 of 4)]
[im 1/50]
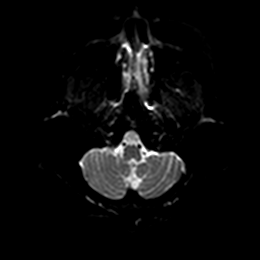
[im 17/50]
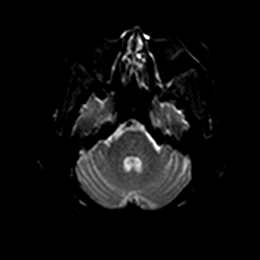
[im 33/50]
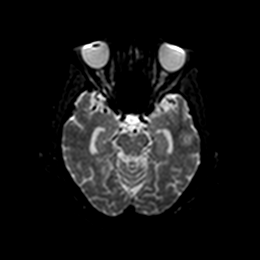
[im 50/50]
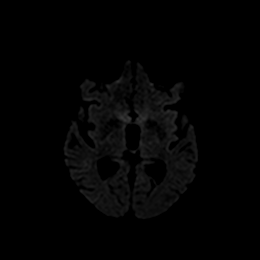

[Series 17: DWI · axial · 2.0mm · 0.88mm/px · z∈[-58,-10]mm · 2 of 25 slices shown (4 of 4)]
[im 1/25]
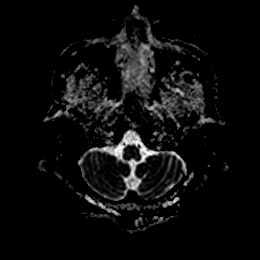
[im 25/25]
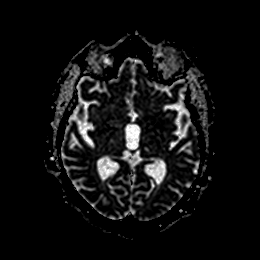

[Series 18: T2 · axial · 5.0mm · 0.72mm/px · z∈[-71,+71]mm · 2 of 25 slices shown (1 of 2)]
[im 1/25]
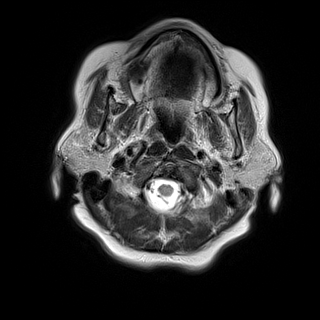
[im 25/25]
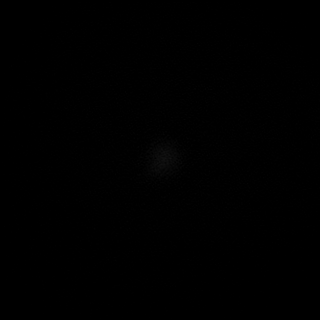

[Series 19: T1 · sagittal · 5.0mm · 0.75mm/px · 2 of 23 slices shown]
[im 1/23]
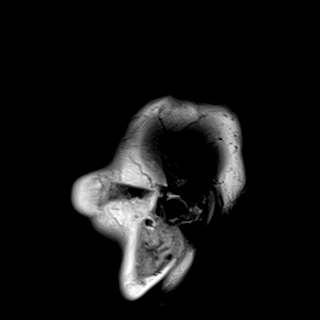
[im 23/23]
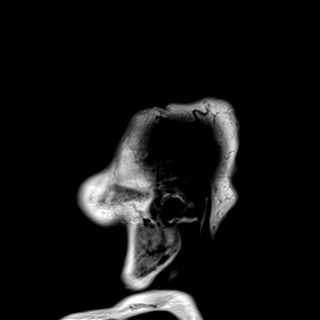

[Series 20: swi_images · axial · 3.0mm · 0.90mm/px · z∈[-54,+75]mm · 3 of 44 slices shown]
[im 1/44]
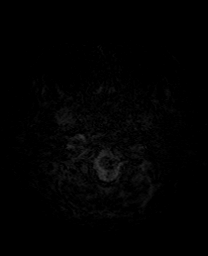
[im 22/44]
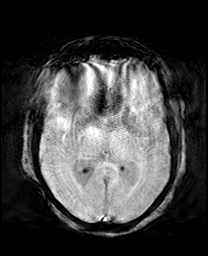
[im 44/44]
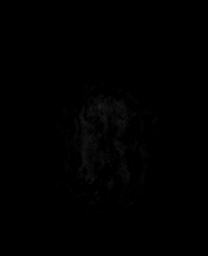

[Series 21: mip_images(sw) · axial · 24.0mm · 0.90mm/px · z∈[-43,+65]mm · 3 of 37 slices shown]
[im 1/37]
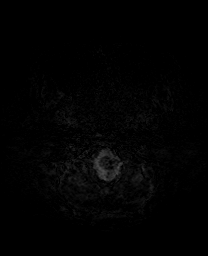
[im 19/37]
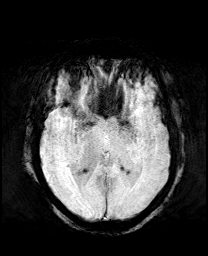
[im 37/37]
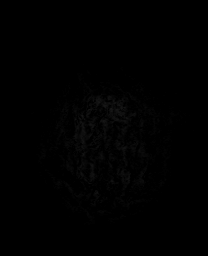

[Series 23: T2 · coronal · 5.0mm · 0.34mm/px · 2 of 29 slices shown (2 of 2)]
[im 1/29]
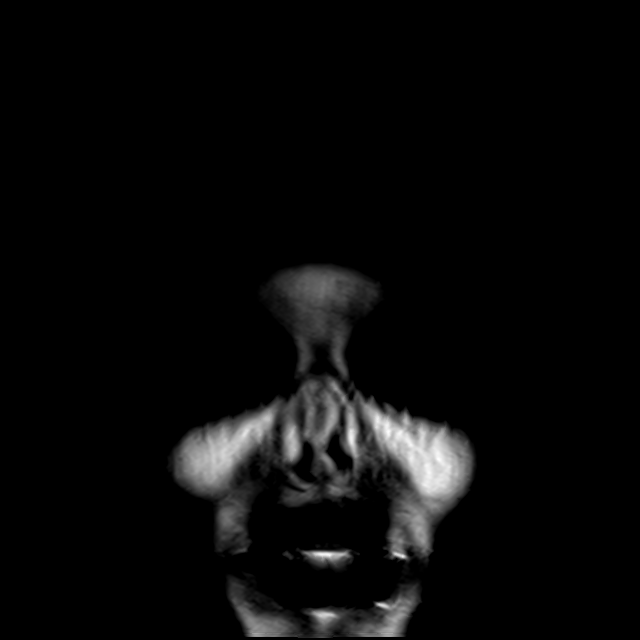
[im 29/29]
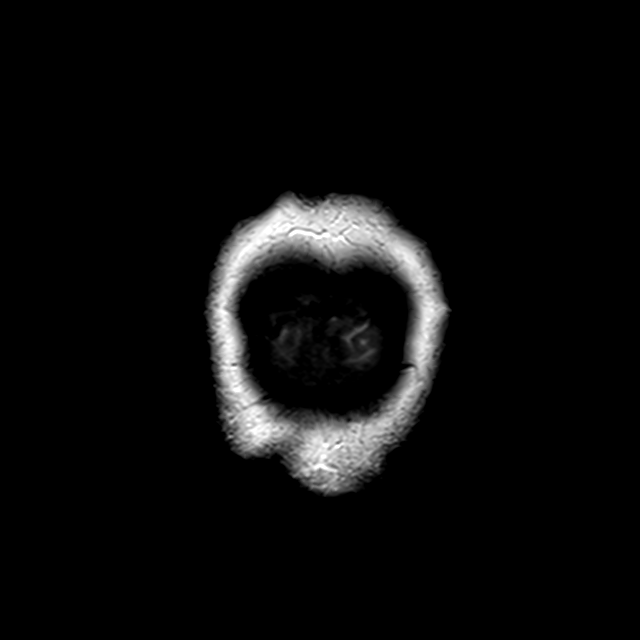

[45 of 48 positions shown; findings below may reference images not displayed]

FINDINGS: BRAIN: There is no acute infarct, acute hemorrhage or mass effect.
The midline structures are normal. There is an old left cerebellar
infarct. Diffuse confluent hyperintense T2-weighted signal within
the periventricular, deep and juxtacortical white matter, most
commonly due to chronic ischemic microangiopathy. Generalized
atrophy without lobar predilection. Single focus of chronic
microhemorrhage in the right brainstem unchanged from prior MRI.

VASCULAR: Major intracranial arterial and venous sinus flow voids
are preserved.

SKULL AND UPPER CERVICAL SPINE: The visualized skull base,
calvarium, upper cervical spine and extracranial soft tissues are
normal.

SINUSES/ORBITS: No fluid levels or advanced mucosal thickening. No
mastoid or middle ear effusion. The orbits are normal.
IMPRESSION: 1. No acute intracranial abnormality.
2. Chronic ischemic microangiopathy and generalized volume loss.
3. Single focus of chronic microhemorrhage in the right brainstem
may correspond to the area gliosis seen on the head CT. This is
unchanged from 08/12/2017.

## 2018-12-01 DIAGNOSIS — E119 Type 2 diabetes mellitus without complications: Secondary | ICD-10-CM | POA: Diagnosis not present

## 2018-12-01 DIAGNOSIS — H25011 Cortical age-related cataract, right eye: Secondary | ICD-10-CM | POA: Diagnosis not present

## 2018-12-01 DIAGNOSIS — H2511 Age-related nuclear cataract, right eye: Secondary | ICD-10-CM | POA: Diagnosis not present

## 2018-12-01 DIAGNOSIS — Z961 Presence of intraocular lens: Secondary | ICD-10-CM | POA: Diagnosis not present

## 2018-12-01 DIAGNOSIS — H35033 Hypertensive retinopathy, bilateral: Secondary | ICD-10-CM | POA: Diagnosis not present

## 2018-12-25 ENCOUNTER — Other Ambulatory Visit: Payer: Self-pay

## 2018-12-25 ENCOUNTER — Ambulatory Visit (HOSPITAL_COMMUNITY)
Admission: RE | Admit: 2018-12-25 | Discharge: 2018-12-25 | Disposition: A | Discharge status: Home / Self Care | Payer: Medicare Other | Source: Ambulatory Visit | Attending: Nephrology | Admitting: Nephrology

## 2018-12-25 DIAGNOSIS — D631 Anemia in chronic kidney disease: Secondary | ICD-10-CM | POA: Diagnosis not present

## 2018-12-25 DIAGNOSIS — N184 Chronic kidney disease, stage 4 (severe): Secondary | ICD-10-CM | POA: Insufficient documentation

## 2018-12-25 LAB — RENAL FUNCTION PANEL
Albumin: 3.2 g/dL — ABNORMAL LOW (ref 3.5–5.0)
Anion gap: 9 (ref 5–15)
BUN: 56 mg/dL — ABNORMAL HIGH (ref 8–23)
CO2: 22 mmol/L (ref 22–32)
Calcium: 8.3 mg/dL — ABNORMAL LOW (ref 8.9–10.3)
Chloride: 111 mmol/L (ref 98–111)
Creatinine, Ser: 3.38 mg/dL — ABNORMAL HIGH (ref 0.44–1.00)
GFR calc Af Amer: 15 mL/min — ABNORMAL LOW (ref >60)
GFR calc non Af Amer: 13 mL/min — ABNORMAL LOW (ref >60)
Glucose, Bld: 191 mg/dL — ABNORMAL HIGH (ref 70–99)
Phosphorus: 4.5 mg/dL (ref 2.5–4.6)
Potassium: 4.4 mmol/L (ref 3.5–5.1)
Sodium: 142 mmol/L (ref 135–145)

## 2018-12-25 LAB — IRON AND TIBC
Iron: 47 ug/dL (ref 28–170)
Saturation Ratios: 21 % (ref 10.4–31.8)
TIBC: 221 ug/dL — ABNORMAL LOW (ref 250–450)
UIBC: 174 ug/dL

## 2018-12-25 LAB — POCT HEMOGLOBIN-HEMACUE: Hemoglobin: 8.2 g/dL — ABNORMAL LOW (ref 12.0–15.0)

## 2018-12-25 LAB — FERRITIN: Ferritin: 462 ng/mL — ABNORMAL HIGH (ref 11–307)

## 2018-12-25 MED ORDER — EPOETIN ALFA 10000 UNIT/ML IJ SOLN
INTRAMUSCULAR | Status: AC
  Administered 2018-12-25: 10000 [IU] via SUBCUTANEOUS
  Filled 2018-12-25: qty 1

## 2018-12-25 MED ORDER — EPOETIN ALFA 20000 UNIT/ML IJ SOLN
INTRAMUSCULAR | Status: AC
  Administered 2018-12-25: 20000 [IU] via SUBCUTANEOUS
  Filled 2018-12-25: qty 1

## 2018-12-25 MED ORDER — EPOETIN ALFA 40000 UNIT/ML IJ SOLN: 30000.0000 [IU] | INTRAMUSCULAR | Status: DC

## 2018-12-26 LAB — PTH, INTACT AND CALCIUM
Calcium, Total (PTH): 8.3 mg/dL — ABNORMAL LOW (ref 8.7–10.3)
PTH: 163 pg/mL — ABNORMAL HIGH (ref 15–65)

## 2018-12-30 ENCOUNTER — Other Ambulatory Visit: Payer: Self-pay | Admitting: Pulmonary Disease

## 2018-12-31 IMAGING — DX DG CHEST 2V
2 series · 2 of 2 positions shown · non-contrast
Comparison: Chest x-ray of September 04, 2017.

CLINICAL DATA: Cough, chest congestion, and wheezing for the past 5
days. History of asthma, former smoker.

EXAM:
CHEST - 2 VIEW

[chest pa]
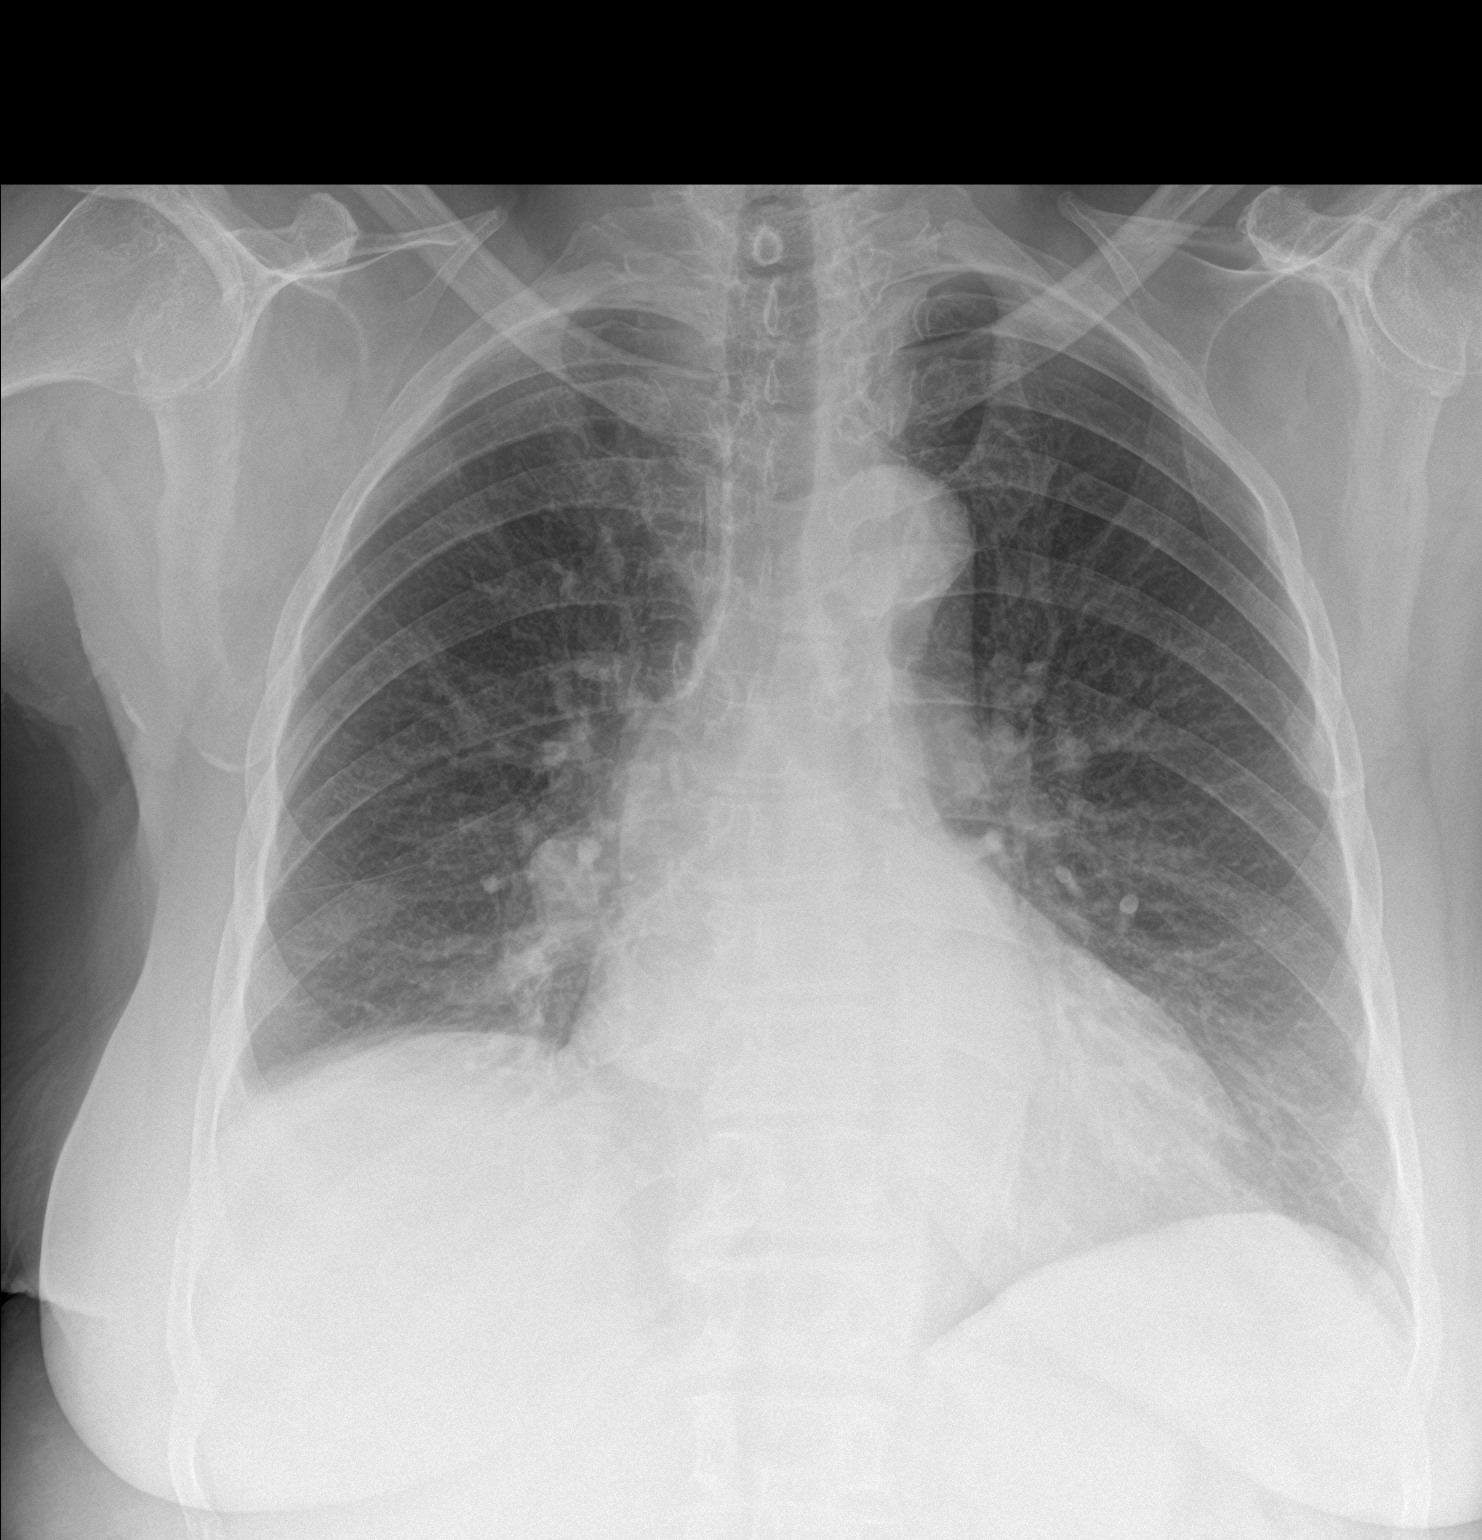

[chest lat]
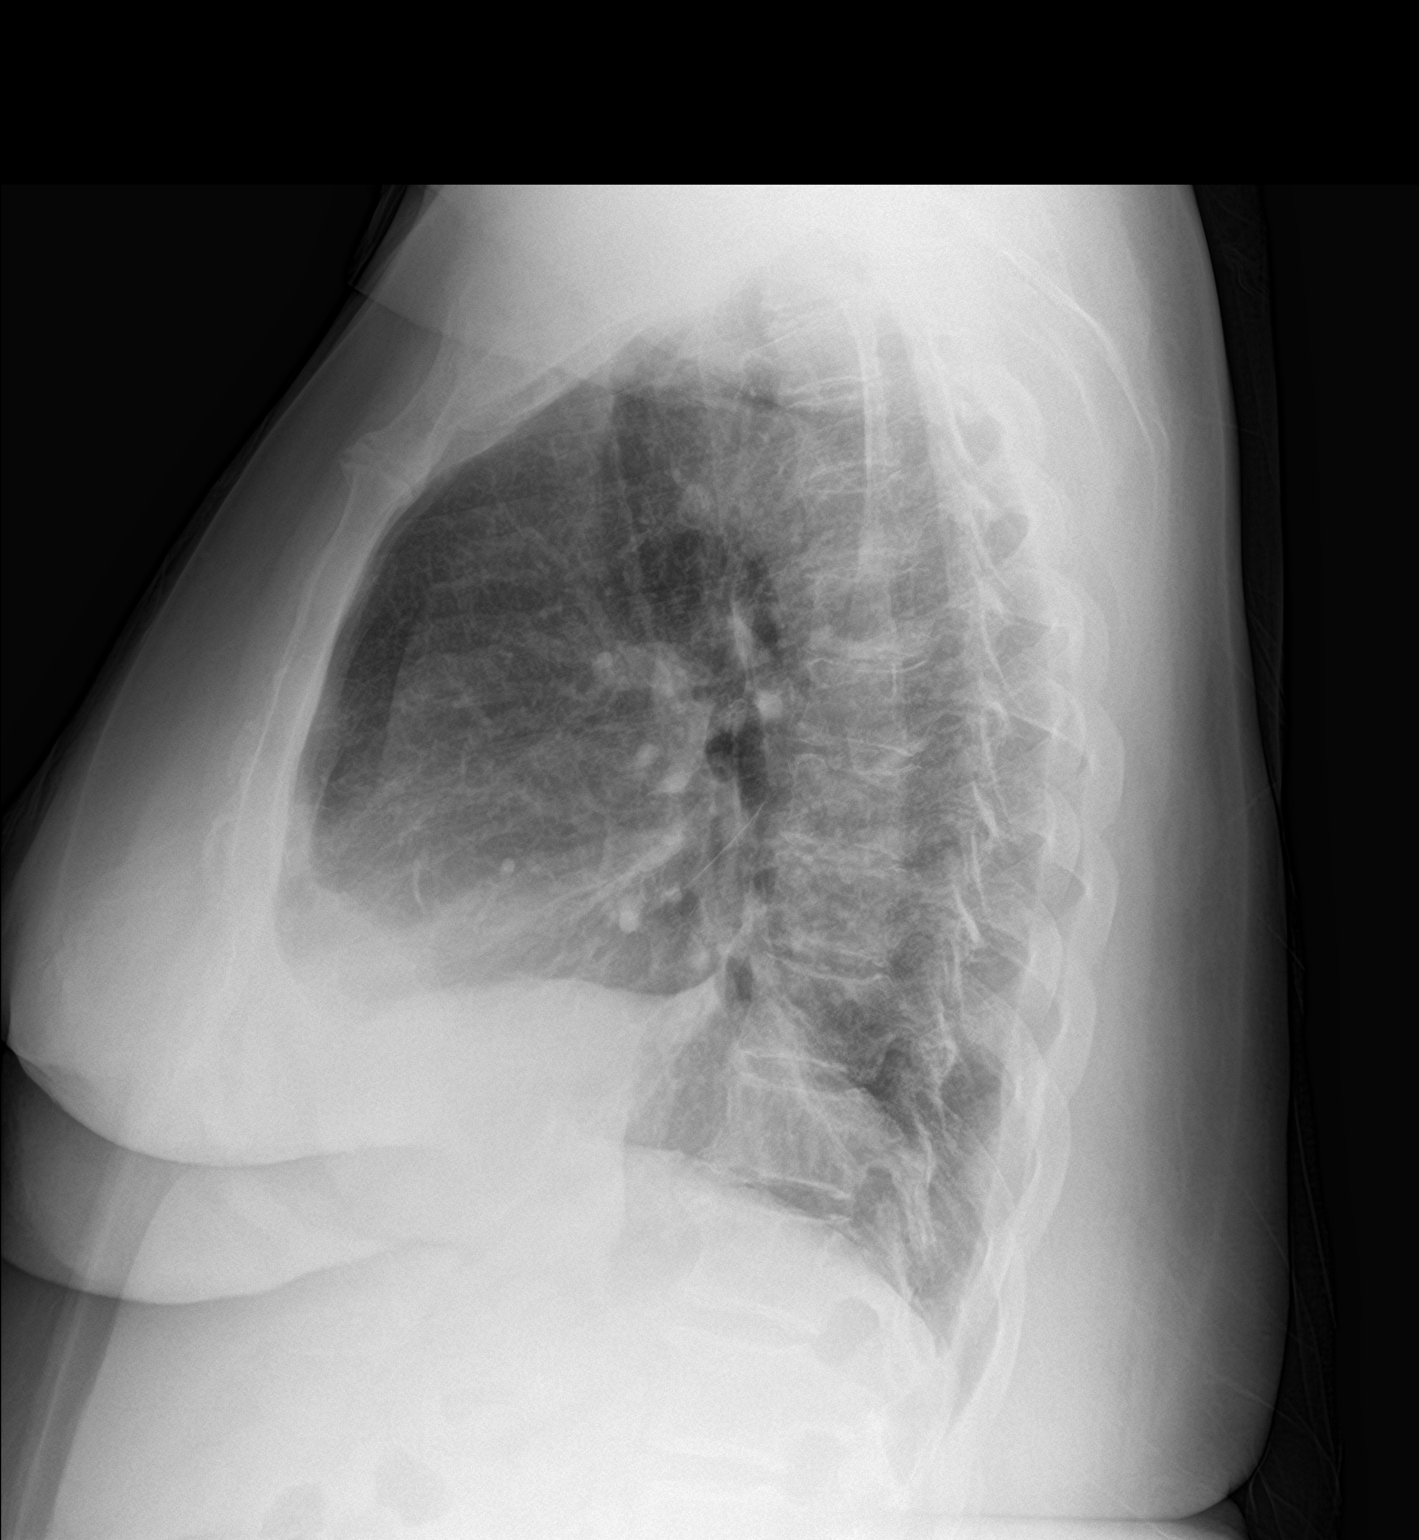

[2 of 2 positions shown; findings below may reference images not displayed]

FINDINGS: The lungs are adequately inflated. The right hemidiaphragm remains
higher than the left. There is mildly increased density at the right
lung base posteriorly which is less conspicuous than on the previous
study. The left lung is clear. The heart is top-normal in size. The
pulmonary vascularity is normal. There is calcification in the wall
of the aortic arch. The trachea is midline.
IMPRESSION: Asthma-COPD. Right posterior basal atelectasis or pneumonia which
has become somewhat less conspicuous than on the previous study. If
it appears clinically that the patient's former symptoms have
recurred, a repeat course of antibiotics may be indicated. A
follow-up chest x-ray in 2-3 weeks is recommended.

## 2019-01-06 ENCOUNTER — Encounter: Payer: Medicare Other | Admitting: Vascular Surgery

## 2019-01-06 ENCOUNTER — Encounter (HOSPITAL_COMMUNITY): Payer: Medicare Other

## 2019-01-08 ENCOUNTER — Other Ambulatory Visit: Payer: Self-pay

## 2019-01-08 ENCOUNTER — Encounter (HOSPITAL_COMMUNITY)
Admission: RE | Admit: 2019-01-08 | Discharge: 2019-01-08 | Disposition: A | Discharge status: Home / Self Care | Payer: Medicare Other | Source: Ambulatory Visit | Attending: Nephrology | Admitting: Nephrology

## 2019-01-08 DIAGNOSIS — N184 Chronic kidney disease, stage 4 (severe): Secondary | ICD-10-CM | POA: Diagnosis present

## 2019-01-08 DIAGNOSIS — D631 Anemia in chronic kidney disease: Secondary | ICD-10-CM | POA: Insufficient documentation

## 2019-01-08 LAB — POCT HEMOGLOBIN-HEMACUE: Hemoglobin: 9.2 g/dL — ABNORMAL LOW (ref 12.0–15.0)

## 2019-01-08 MED ORDER — EPOETIN ALFA 40000 UNIT/ML IJ SOLN: 30000.0000 [IU] | INTRAMUSCULAR | Status: DC

## 2019-01-08 MED ORDER — EPOETIN ALFA 10000 UNIT/ML IJ SOLN
INTRAMUSCULAR | Status: AC
  Administered 2019-01-08: 10000 [IU] via SUBCUTANEOUS
  Filled 2019-01-08: qty 1

## 2019-01-08 MED ORDER — EPOETIN ALFA 20000 UNIT/ML IJ SOLN
INTRAMUSCULAR | Status: AC
  Administered 2019-01-08: 20000 [IU] via SUBCUTANEOUS
  Filled 2019-01-08: qty 1

## 2019-01-12 ENCOUNTER — Telehealth (HOSPITAL_COMMUNITY): Payer: Self-pay | Admitting: *Deleted

## 2019-01-12 ENCOUNTER — Other Ambulatory Visit: Payer: Self-pay

## 2019-01-12 DIAGNOSIS — N184 Chronic kidney disease, stage 4 (severe): Secondary | ICD-10-CM

## 2019-01-12 NOTE — Telephone Encounter (Signed)
The above patient or their representative was contacted and gave the following answers to these questions:         Do you have any of the following symptoms?    NO  Fever                    Cough                   Shortness of breath  Do  you have any of the following other symptoms?    muscle pain         vomiting,        diarrhea        rash         weakness        red eye        abdominal pain         bruising          bruising or bleeding              joint pain           severe headache    Have you been in contact with someone who was or has been sick in the past 2 weeks?  NO  Yes                 Unsure                         Unable to assess   Does the person that you were in contact with have any of the following symptoms? n  Cough         shortness of breath           muscle pain         vomiting,            diarrhea            rash            weakness           fever            red eye           abdominal pain           bruising  or  bleeding                joint pain                severe headache                 COMMENTS OR ACTION PLAN FOR THIS PATIENT:         q

## 2019-01-13 ENCOUNTER — Encounter: Payer: Self-pay | Admitting: Vascular Surgery

## 2019-01-13 ENCOUNTER — Other Ambulatory Visit: Payer: Self-pay

## 2019-01-13 ENCOUNTER — Ambulatory Visit (INDEPENDENT_AMBULATORY_CARE_PROVIDER_SITE_OTHER): Payer: Self-pay | Admitting: Vascular Surgery

## 2019-01-13 ENCOUNTER — Ambulatory Visit (HOSPITAL_COMMUNITY)
Admission: RE | Admit: 2019-01-13 | Discharge: 2019-01-13 | Disposition: A | Discharge status: Home / Self Care | Payer: Medicare Other | Source: Ambulatory Visit | Attending: Vascular Surgery | Admitting: Vascular Surgery

## 2019-01-13 VITALS — BP 143/77 | HR 64 | Temp 97.2°F | Resp 20 | Ht 64.5 in | Wt 263.8 lb

## 2019-01-13 DIAGNOSIS — N184 Chronic kidney disease, stage 4 (severe): Secondary | ICD-10-CM | POA: Diagnosis present

## 2019-01-13 NOTE — Progress Notes (Signed)
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  HPI:   Joann Gutierrez is a pleasant 73 y.o. female who is not yet on dialysis.  She had a right brachiocephalic fistula on 40/98/1191 and comes in for a 6-week follow-up visit.  She has no specific complaints.  She denies pain or paresthesias in her right arm.  Current Outpatient Medications  Medication Sig Dispense Refill  . albuterol (PROVENTIL HFA;VENTOLIN HFA) 108 (90 Base) MCG/ACT inhaler Inhale 2 puffs into the lungs every 6 (six) hours as needed for wheezing or shortness of breath. 1 Inhaler 6  . amLODipine (NORVASC) 10 MG tablet Take 10 mg by mouth every evening.    Marland Kitchen aspirin EC 81 MG tablet Take 81 mg by mouth daily.    Marland Kitchen atorvastatin (LIPITOR) 40 MG tablet     . azelastine (ASTELIN) 0.1 % nasal spray Place 1 spray into both nostrils 2 (two) times daily as needed (allergies). Use in each nostril as directed     . carvedilol (COREG) 25 MG tablet Take 25 mg by mouth 2 (two) times daily.    Marland Kitchen donepezil (ARICEPT) 10 MG tablet Take 10 mg by mouth at bedtime.    Marland Kitchen doxazosin (CARDURA) 2 MG tablet     . ezetimibe (ZETIA) 10 MG tablet Take 10 mg by mouth daily.     . furosemide (LASIX) 80 MG tablet Take 80 mg by mouth 2 (two) times daily.    Marland Kitchen gabapentin (NEURONTIN) 100 MG capsule Take 1 capsule (100 mg total) by mouth 2 (two) times daily.    . insulin aspart (NOVOLOG) 100 UNIT/ML injection Inject 5 Units into the skin 3 (three) times daily with meals. (Patient taking differently: Inject 0-15 Units into the skin 3 (three) times daily with meals. )    . isosorbide mononitrate (IMDUR) 120 MG 24 hr tablet isosorbide mononitrate ER 120 mg tablet,extended release 24 hr    . ketorolac (ACULAR) 0.4 % SOLN Place 1 drop into the left eye 2 (two) times a day.    Marland Kitchen LEVEMIR FLEXTOUCH 100 UNIT/ML Pen Inject 18 Units into the skin daily. (Patient taking  differently: Inject 30 Units into the skin daily. )    . losartan (COZAAR) 100 MG tablet Take 100 mg by mouth daily.    . montelukast (SINGULAIR) 10 MG tablet Take 1 tablet (10 mg total) by mouth daily. (Patient taking differently: Take 10 mg by mouth at bedtime. ) 30 tablet 3  . nitroGLYCERIN (NITROSTAT) 0.4 MG SL tablet PLACE ONE TABLET UNDER THE TONGUE EVERY FIVE MINUTES AS NEEDED FOR CHEST PAIN 25 tablet 6  . NOVOLOG FLEXPEN 100 UNIT/ML FlexPen INJECT 5 UNITS SUBCUTANEOUSLY 3 TIMES DAILY AS DIRECTED SLIDING SCALE    . omeprazole (PRILOSEC) 40 MG capsule Take 1 capsule (40 mg total) by mouth 2 (two) times a day. 180 capsule 1  . oxyCODONE (ROXICODONE) 5 MG immediate release tablet Take 1 tablet (5 mg total) by mouth every 4 (four) hours as needed. 15 tablet 0  . OXYGEN Inhale 2 L into the lungs as needed. CPAP with oxygen at bedtime     . polyethylene glycol (MIRALAX) 17 g packet Take 17 g by mouth daily as needed. (Patient taking differently: Take 17 g by mouth daily as needed (constipation.). ) 14 each 0  . raloxifene (EVISTA) 60 MG tablet Take 60 mg by mouth every evening.     Marland Kitchen  spironolactone (ALDACTONE) 25 MG tablet Take 25 mg by mouth daily.    . TRELEGY ELLIPTA 100-62.5-25 MCG/INH AEPB inhale 1 PUFF into THE lungs daily 60 each 0  . TRUEPLUS PEN NEEDLES 32G X 4 MM MISC INJECT THREE TIMES A DAY AS DIRECTED  1  . Vitamin D, Ergocalciferol, (DRISDOL) 50000 UNITS CAPS capsule Take 50,000 Units by mouth every Friday.      No current facility-administered medications for this visit.    REVIEW OF SYSTEMS:  [X]  denotes positive finding, [ ]  denotes negative finding Vascular    Leg swelling    Cardiac    Chest pain or chest pressure:    Shortness of breath upon exertion:    Short of breath when lying flat:    Irregular heart rhythm:    Constitutional    Fever or chills:     PHYSICAL EXAM:   Vitals:   01/13/19 1008  BP: (!) 143/77  Pulse: 64  Resp: 20  Temp: (!) 97.2 F (36.2 C)   SpO2: 97%  Weight: 263 lb 12.8 oz (119.7 kg)  Height: 5' 4.5" (1.638 m)    GENERAL: The patient is a well-nourished female, in no acute distress. The vital signs are documented above. CARDIOVASCULAR: There is a regular rate and rhythm. PULMONARY: There is good air exchange bilaterally without wheezing or rales. Her right upper arm fistula has an excellent thrill.  It appears to be maturing adequately.  Her incision is healing nicely. She has a palpable right radial pulse.  DATA:   Duplex dialysis access: I have independently interpreted the patient's duplex of her right brachiocephalic fistula.  The diameters of the fistula ranged from 0.61-0.69 cm.  The vein is somewhat deep with depths ranging from 0.72-0.89 cm.  There are some competing branches noted.  MEDICAL ISSUES:   STATUS POST RIGHT BRACHIOCEPHALIC FISTULA: Her fistula appears to be maturing adequately.  I think it would be ready for access in February if it is needed.  It is fairly easy to feel in the lower half of the upper arm.  The vein is somewhat deeper in the upper half of the upper arm.  If there are any problems with cannulating the fistula then certainly we could consider superficialization.  However currently it looks like there is adequate length to stick the vein where it is more superficial.  Deitra Mayo [facility]

## 2019-01-25 ENCOUNTER — Other Ambulatory Visit: Payer: Self-pay

## 2019-01-25 ENCOUNTER — Ambulatory Visit (HOSPITAL_COMMUNITY)
Admission: RE | Admit: 2019-01-25 | Discharge: 2019-01-25 | Disposition: A | Discharge status: Home / Self Care | Payer: Medicare HMO | Source: Ambulatory Visit | Attending: Nephrology | Admitting: Nephrology

## 2019-01-25 DIAGNOSIS — D631 Anemia in chronic kidney disease: Secondary | ICD-10-CM | POA: Diagnosis not present

## 2019-01-25 DIAGNOSIS — N183 Chronic kidney disease, stage 3 unspecified: Secondary | ICD-10-CM | POA: Diagnosis present

## 2019-01-25 LAB — RENAL FUNCTION PANEL
Albumin: 3.1 g/dL — ABNORMAL LOW (ref 3.5–5.0)
Anion gap: 9 (ref 5–15)
BUN: 47 mg/dL — ABNORMAL HIGH (ref 8–23)
CO2: 24 mmol/L (ref 22–32)
Calcium: 8.8 mg/dL — ABNORMAL LOW (ref 8.9–10.3)
Chloride: 112 mmol/L — ABNORMAL HIGH (ref 98–111)
Creatinine, Ser: 3.13 mg/dL — ABNORMAL HIGH (ref 0.44–1.00)
GFR calc Af Amer: 16 mL/min — ABNORMAL LOW (ref >60)
GFR calc non Af Amer: 14 mL/min — ABNORMAL LOW (ref >60)
Glucose, Bld: 168 mg/dL — ABNORMAL HIGH (ref 70–99)
Phosphorus: 4.2 mg/dL (ref 2.5–4.6)
Potassium: 4.2 mmol/L (ref 3.5–5.1)
Sodium: 145 mmol/L (ref 135–145)

## 2019-01-25 LAB — IRON AND TIBC
Iron: 96 ug/dL (ref 28–170)
Saturation Ratios: 46 % — ABNORMAL HIGH (ref 10.4–31.8)
TIBC: 210 ug/dL — ABNORMAL LOW (ref 250–450)
UIBC: 114 ug/dL

## 2019-01-25 LAB — POCT HEMOGLOBIN-HEMACUE: Hemoglobin: 9.2 g/dL — ABNORMAL LOW (ref 12.0–15.0)

## 2019-01-25 LAB — FERRITIN: Ferritin: 335 ng/mL — ABNORMAL HIGH (ref 11–307)

## 2019-01-25 MED ORDER — EPOETIN ALFA 20000 UNIT/ML IJ SOLN
INTRAMUSCULAR | Status: AC
  Administered 2019-01-25: 20000 [IU] via SUBCUTANEOUS
  Filled 2019-01-25: qty 1

## 2019-01-25 MED ORDER — EPOETIN ALFA 10000 UNIT/ML IJ SOLN
INTRAMUSCULAR | Status: AC
  Administered 2019-01-25: 10000 [IU] via SUBCUTANEOUS
  Filled 2019-01-25: qty 1

## 2019-01-25 MED ORDER — EPOETIN ALFA 40000 UNIT/ML IJ SOLN: 30000.0000 [IU] | INTRAMUSCULAR | Status: DC

## 2019-01-26 LAB — PTH, INTACT AND CALCIUM
Calcium, Total (PTH): 8.7 mg/dL (ref 8.7–10.3)
PTH: 114 pg/mL — ABNORMAL HIGH (ref 15–65)

## 2019-02-08 ENCOUNTER — Other Ambulatory Visit: Payer: Self-pay

## 2019-02-08 ENCOUNTER — Ambulatory Visit (HOSPITAL_COMMUNITY)
Admission: RE | Admit: 2019-02-08 | Discharge: 2019-02-08 | Disposition: A | Discharge status: Home / Self Care | Payer: Medicare HMO | Source: Ambulatory Visit | Attending: Nephrology | Admitting: Nephrology

## 2019-02-08 DIAGNOSIS — D631 Anemia in chronic kidney disease: Secondary | ICD-10-CM | POA: Diagnosis not present

## 2019-02-08 DIAGNOSIS — N183 Chronic kidney disease, stage 3 unspecified: Secondary | ICD-10-CM | POA: Diagnosis not present

## 2019-02-08 LAB — POCT HEMOGLOBIN-HEMACUE: Hemoglobin: 10.9 g/dL — ABNORMAL LOW (ref 12.0–15.0)

## 2019-02-08 MED ORDER — EPOETIN ALFA 40000 UNIT/ML IJ SOLN
30000.0000 [IU] | INTRAMUSCULAR | Status: DC
  Administered 2019-02-08: 30000 [IU] via SUBCUTANEOUS

## 2019-02-08 MED ORDER — EPOETIN ALFA 20000 UNIT/ML IJ SOLN
INTRAMUSCULAR | Status: AC
  Filled 2019-02-08: qty 1

## 2019-02-08 MED ORDER — EPOETIN ALFA 10000 UNIT/ML IJ SOLN
INTRAMUSCULAR | Status: AC
  Filled 2019-02-08: qty 1

## 2019-02-09 MED FILL — Epoetin Alfa Inj 20000 Unit/ML: INTRAMUSCULAR | Qty: 1 | Status: AC

## 2019-02-09 MED FILL — Epoetin Alfa Inj 10000 Unit/ML: INTRAMUSCULAR | Qty: 1 | Status: AC

## 2019-02-14 IMAGING — DX DG CHEST 2V
2 series · 2 of 2 positions shown · non-contrast
Comparison: 10/06/2017.

CLINICAL DATA: Shortness of breath.  Cough.  Left-sided chest pain.

EXAM:
CHEST - 2 VIEW

[chest pa]
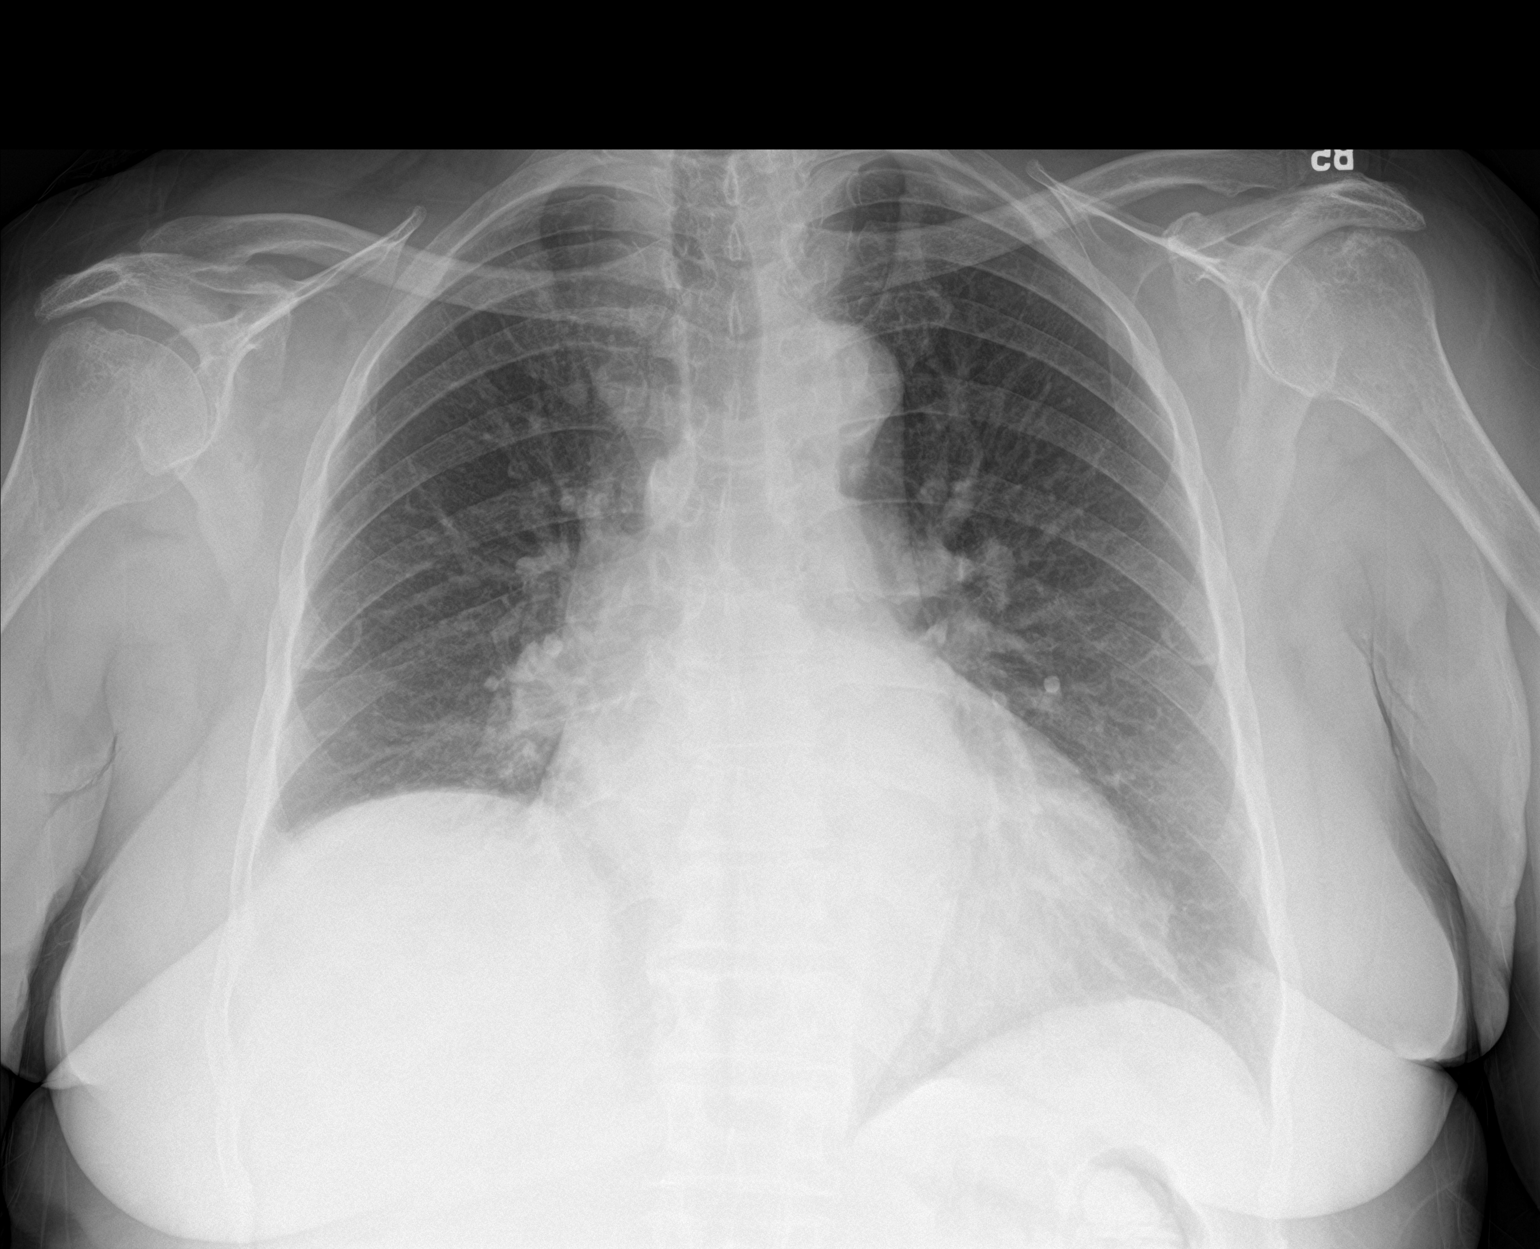

[chest lat]
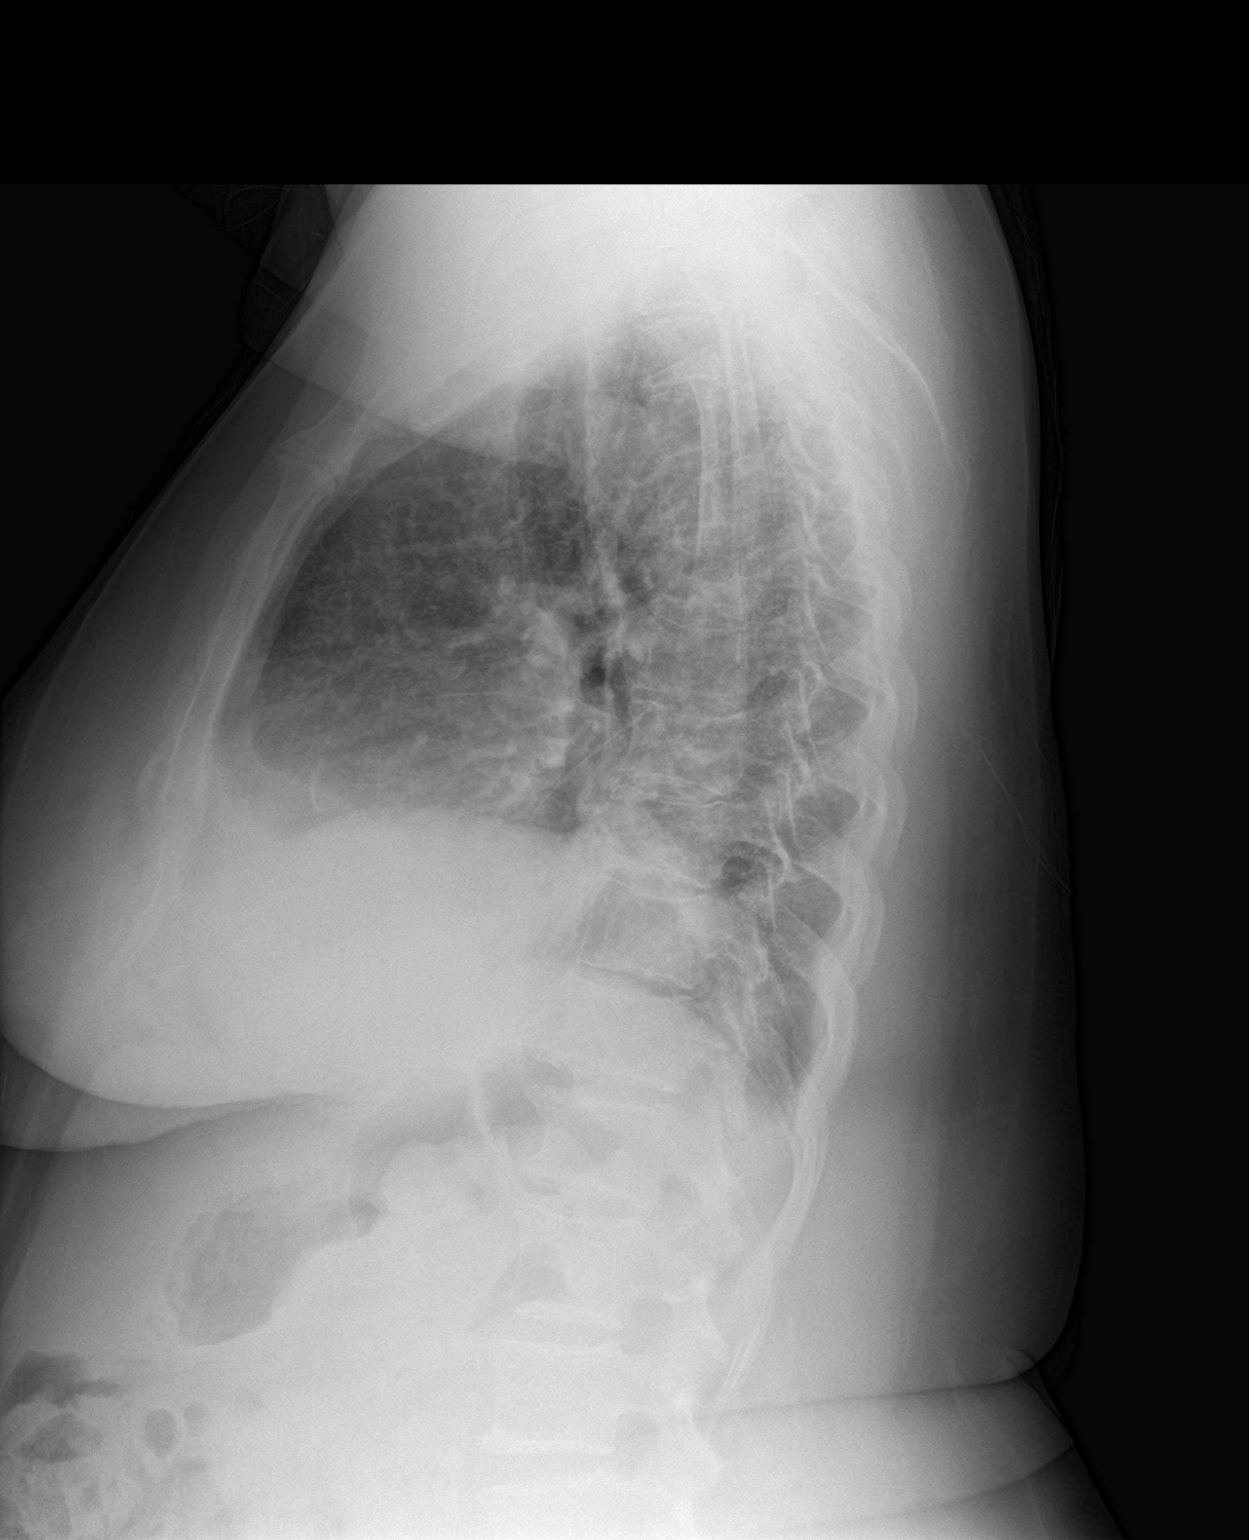

[2 of 2 positions shown; findings below may reference images not displayed]

FINDINGS: Mediastinum and hilar structures normal. Cardiomegaly with normal
pulmonary vascularity. Mild right base atelectasis/infiltrate. No
pleural effusion or pneumothorax.
IMPRESSION: 1.  Mild right base atelectasis/infiltrate.

2.  Stable cardiomegaly.

## 2019-02-22 ENCOUNTER — Ambulatory Visit (HOSPITAL_COMMUNITY)
Admission: RE | Admit: 2019-02-22 | Discharge: 2019-02-22 | Disposition: A | Discharge status: Home / Self Care | Payer: Medicare HMO | Source: Ambulatory Visit | Attending: Nephrology | Admitting: Nephrology

## 2019-02-22 ENCOUNTER — Other Ambulatory Visit: Payer: Self-pay

## 2019-02-22 VITALS — BP 167/100 | HR 74 | Temp 96.5°F | Resp 20

## 2019-02-22 DIAGNOSIS — D631 Anemia in chronic kidney disease: Secondary | ICD-10-CM | POA: Diagnosis not present

## 2019-02-22 DIAGNOSIS — N183 Chronic kidney disease, stage 3 unspecified: Secondary | ICD-10-CM

## 2019-02-22 LAB — IRON AND TIBC
Iron: 130 ug/dL (ref 28–170)
Saturation Ratios: 62 % — ABNORMAL HIGH (ref 10.4–31.8)
TIBC: 209 ug/dL — ABNORMAL LOW (ref 250–450)
UIBC: 79 ug/dL

## 2019-02-22 LAB — RENAL FUNCTION PANEL
Albumin: 3.3 g/dL — ABNORMAL LOW (ref 3.5–5.0)
Anion gap: 10 (ref 5–15)
BUN: 40 mg/dL — ABNORMAL HIGH (ref 8–23)
CO2: 25 mmol/L (ref 22–32)
Calcium: 8.3 mg/dL — ABNORMAL LOW (ref 8.9–10.3)
Chloride: 109 mmol/L (ref 98–111)
Creatinine, Ser: 3.18 mg/dL — ABNORMAL HIGH (ref 0.44–1.00)
GFR calc Af Amer: 16 mL/min — ABNORMAL LOW (ref >60)
GFR calc non Af Amer: 14 mL/min — ABNORMAL LOW (ref >60)
Glucose, Bld: 147 mg/dL — ABNORMAL HIGH (ref 70–99)
Phosphorus: 3.3 mg/dL (ref 2.5–4.6)
Potassium: 4.8 mmol/L (ref 3.5–5.1)
Sodium: 144 mmol/L (ref 135–145)

## 2019-02-22 LAB — FERRITIN: Ferritin: 453 ng/mL — ABNORMAL HIGH (ref 11–307)

## 2019-02-22 LAB — POCT HEMOGLOBIN-HEMACUE: Hemoglobin: 9.5 g/dL — ABNORMAL LOW (ref 12.0–15.0)

## 2019-02-22 MED ORDER — EPOETIN ALFA 40000 UNIT/ML IJ SOLN: 30000.0000 [IU] | INTRAMUSCULAR | Status: DC

## 2019-02-22 MED ORDER — CLONIDINE HCL 0.1 MG PO TABS
ORAL_TABLET | ORAL | Status: AC
  Filled 2019-02-22: qty 1

## 2019-02-22 MED ORDER — EPOETIN ALFA 10000 UNIT/ML IJ SOLN
INTRAMUSCULAR | Status: AC
  Administered 2019-02-22: 10000 [IU] via SUBCUTANEOUS
  Filled 2019-02-22: qty 1

## 2019-02-22 MED ORDER — CLONIDINE HCL 0.1 MG PO TABS
0.1000 mg | ORAL_TABLET | Freq: Once | ORAL | Status: AC
  Administered 2019-02-22: 0.1 mg via ORAL

## 2019-02-22 MED ORDER — EPOETIN ALFA 20000 UNIT/ML IJ SOLN
INTRAMUSCULAR | Status: AC
  Administered 2019-02-22: 20000 [IU] via SUBCUTANEOUS
  Filled 2019-02-22: qty 1

## 2019-02-23 LAB — PTH, INTACT AND CALCIUM
Calcium, Total (PTH): 8.5 mg/dL — ABNORMAL LOW (ref 8.7–10.3)
PTH: 98 pg/mL — ABNORMAL HIGH (ref 15–65)

## 2019-03-04 ENCOUNTER — Other Ambulatory Visit: Payer: Self-pay | Admitting: Gastroenterology

## 2019-03-04 ENCOUNTER — Other Ambulatory Visit: Payer: Self-pay | Admitting: Internal Medicine

## 2019-03-04 DIAGNOSIS — I120 Hypertensive chronic kidney disease with stage 5 chronic kidney disease or end stage renal disease: Secondary | ICD-10-CM

## 2019-03-08 ENCOUNTER — Ambulatory Visit (HOSPITAL_COMMUNITY)
Admission: RE | Admit: 2019-03-08 | Discharge: 2019-03-08 | Disposition: A | Discharge status: Home / Self Care | Payer: Medicare HMO | Source: Ambulatory Visit | Attending: Nephrology | Admitting: Nephrology

## 2019-03-08 ENCOUNTER — Other Ambulatory Visit: Payer: Self-pay

## 2019-03-08 VITALS — BP 141/71 | HR 70 | Temp 97.4°F | Resp 20

## 2019-03-08 DIAGNOSIS — N183 Chronic kidney disease, stage 3 unspecified: Secondary | ICD-10-CM | POA: Insufficient documentation

## 2019-03-08 LAB — POCT HEMOGLOBIN-HEMACUE: Hemoglobin: 9.4 g/dL — ABNORMAL LOW (ref 12.0–15.0)

## 2019-03-08 MED ORDER — EPOETIN ALFA 10000 UNIT/ML IJ SOLN
INTRAMUSCULAR | Status: AC
  Administered 2019-03-08: 13:00:00 10000 [IU]
  Filled 2019-03-08: qty 1

## 2019-03-08 MED ORDER — EPOETIN ALFA 20000 UNIT/ML IJ SOLN
INTRAMUSCULAR | Status: AC
  Administered 2019-03-08: 20000 [IU]
  Filled 2019-03-08: qty 1

## 2019-03-08 MED ORDER — EPOETIN ALFA 40000 UNIT/ML IJ SOLN: 30000.0000 [IU] | INTRAMUSCULAR | Status: DC

## 2019-03-11 ENCOUNTER — Other Ambulatory Visit: Payer: Medicare HMO

## 2019-03-19 ENCOUNTER — Ambulatory Visit
Admission: RE | Admit: 2019-03-19 | Discharge: 2019-03-19 | Disposition: A | Discharge status: Home / Self Care | Payer: Medicare HMO | Source: Ambulatory Visit | Attending: Internal Medicine | Admitting: Internal Medicine

## 2019-03-19 DIAGNOSIS — I120 Hypertensive chronic kidney disease with stage 5 chronic kidney disease or end stage renal disease: Secondary | ICD-10-CM

## 2019-03-21 IMAGING — DX DG KNEE 1-2V*R*
2 series · 2 of 2 positions shown · non-contrast
Comparison: 09/23/2007.

CLINICAL DATA: Right knee injury after fall.

EXAM:
RIGHT KNEE - 1-2 VIEW

[dg knee 1-2 views right (1 of 2)]
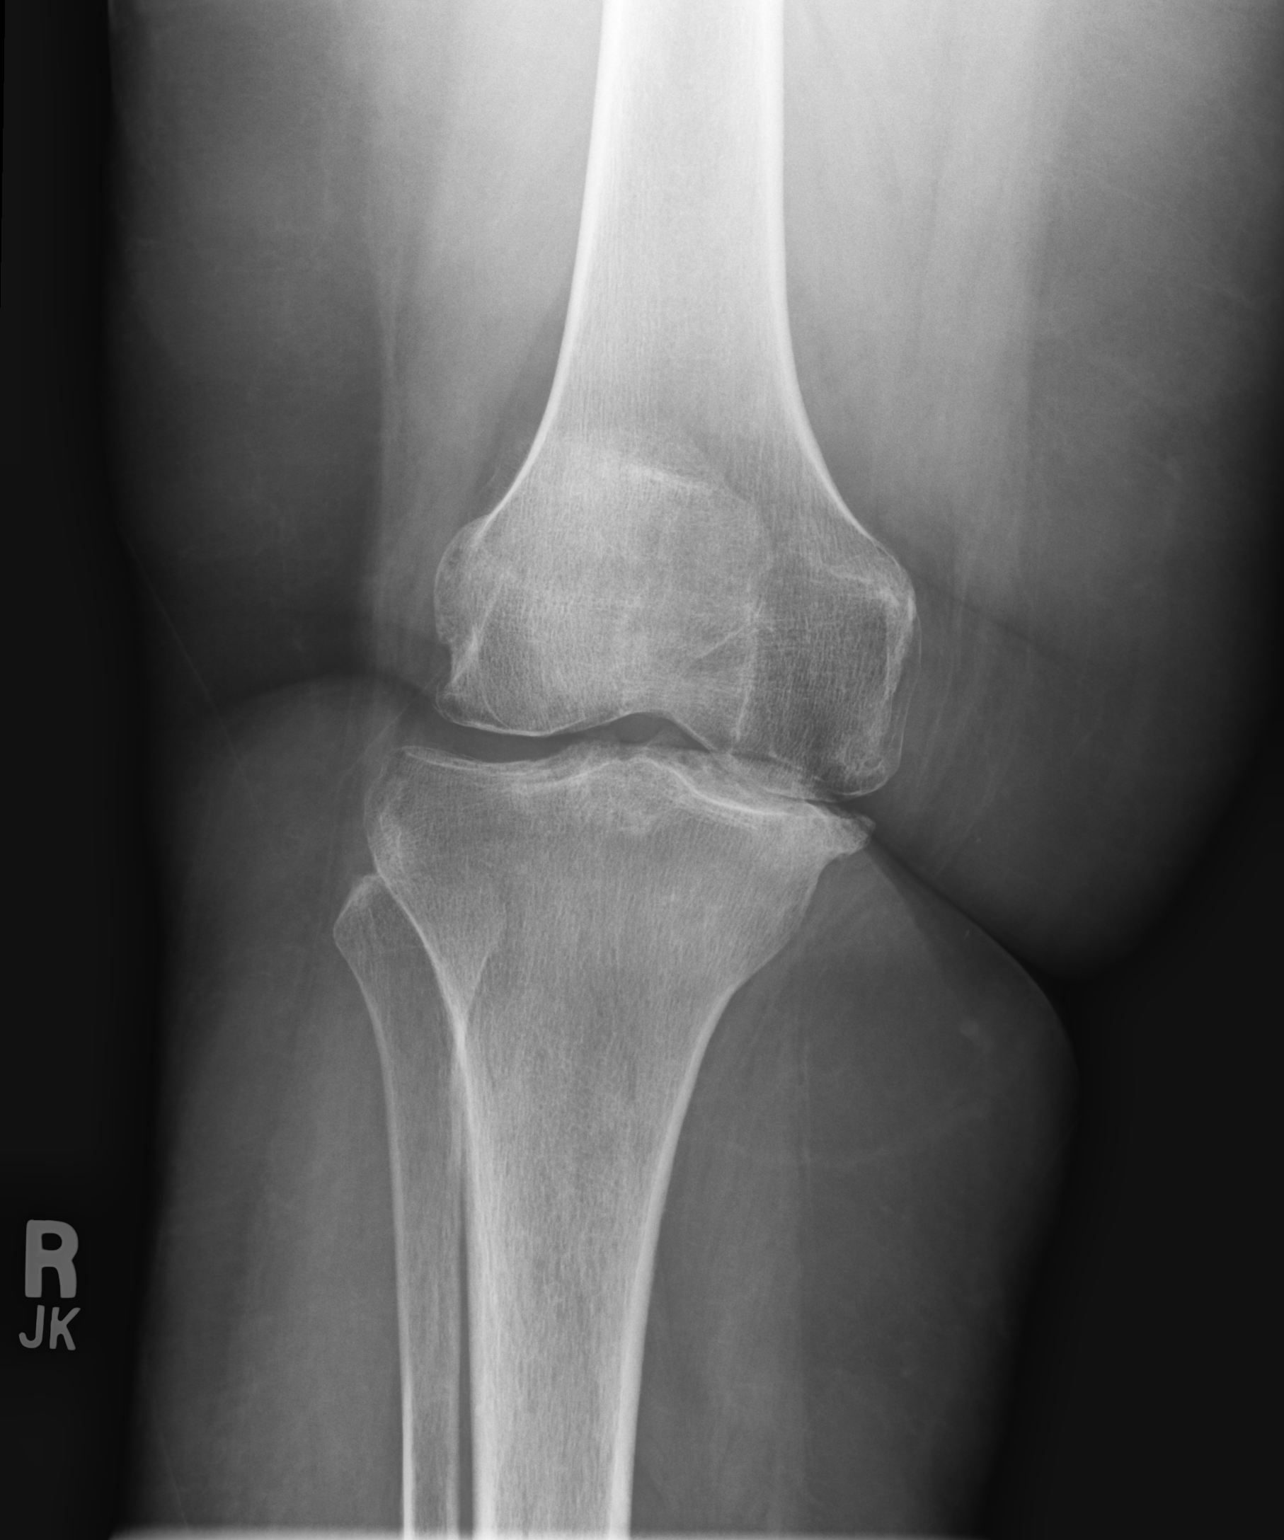

[dg knee 1-2 views right (2 of 2)]
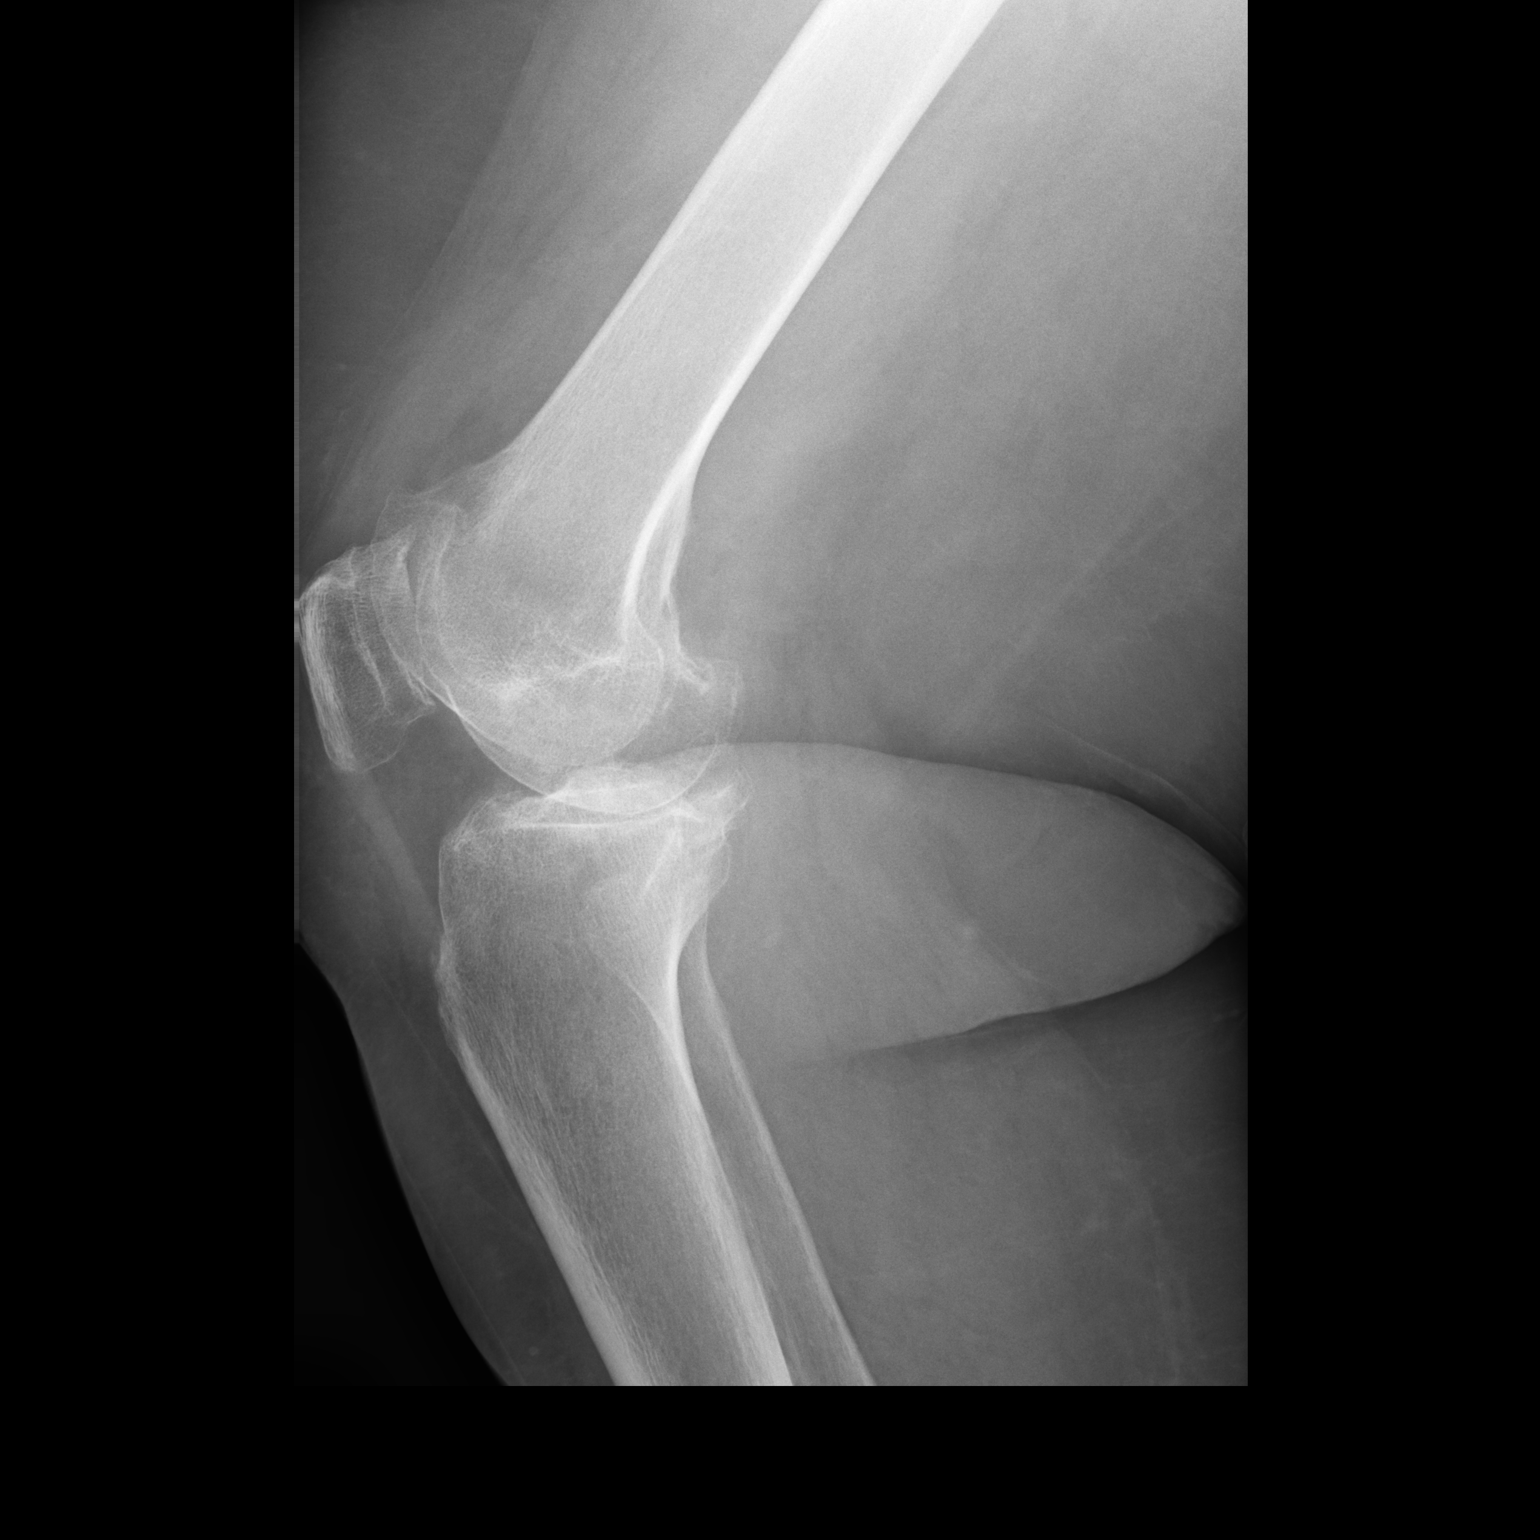

[2 of 2 positions shown; findings below may reference images not displayed]

FINDINGS: Severe diffuse tricompartment degenerative change. Degenerative
changes most prominent about the medial and patellofemoral
compartments. No acute bony abnormality identified. No evidence of
fracture or dislocation.
IMPRESSION: Severe diffuse tricompartment degenerative change. Degenerative
changes most prominent about the medial patellofemoral compartments.

## 2019-03-22 ENCOUNTER — Encounter (HOSPITAL_COMMUNITY)
Admission: RE | Admit: 2019-03-22 | Discharge: 2019-03-22 | Disposition: A | Discharge status: Home / Self Care | Payer: Medicare HMO | Source: Ambulatory Visit | Attending: Nephrology | Admitting: Nephrology

## 2019-03-22 VITALS — BP 126/65 | HR 66 | Temp 95.5°F | Resp 20

## 2019-03-22 DIAGNOSIS — N184 Chronic kidney disease, stage 4 (severe): Secondary | ICD-10-CM | POA: Diagnosis not present

## 2019-03-22 DIAGNOSIS — D631 Anemia in chronic kidney disease: Secondary | ICD-10-CM | POA: Diagnosis not present

## 2019-03-22 DIAGNOSIS — N183 Chronic kidney disease, stage 3 unspecified: Secondary | ICD-10-CM

## 2019-03-22 LAB — RENAL FUNCTION PANEL
Albumin: 3.1 g/dL — ABNORMAL LOW (ref 3.5–5.0)
Anion gap: 12 (ref 5–15)
BUN: 50 mg/dL — ABNORMAL HIGH (ref 8–23)
CO2: 22 mmol/L (ref 22–32)
Calcium: 7.4 mg/dL — ABNORMAL LOW (ref 8.9–10.3)
Chloride: 108 mmol/L (ref 98–111)
Creatinine, Ser: 4.86 mg/dL — ABNORMAL HIGH (ref 0.44–1.00)
GFR calc Af Amer: 10 mL/min — ABNORMAL LOW (ref >60)
GFR calc non Af Amer: 8 mL/min — ABNORMAL LOW (ref >60)
Glucose, Bld: 90 mg/dL (ref 70–99)
Phosphorus: 5.7 mg/dL — ABNORMAL HIGH (ref 2.5–4.6)
Potassium: 4.8 mmol/L (ref 3.5–5.1)
Sodium: 142 mmol/L (ref 135–145)

## 2019-03-22 LAB — IRON AND TIBC
Iron: 164 ug/dL (ref 28–170)
Saturation Ratios: 74 % — ABNORMAL HIGH (ref 10.4–31.8)
TIBC: 223 ug/dL — ABNORMAL LOW (ref 250–450)
UIBC: 59 ug/dL

## 2019-03-22 LAB — FERRITIN: Ferritin: 408 ng/mL — ABNORMAL HIGH (ref 11–307)

## 2019-03-22 LAB — POCT HEMOGLOBIN-HEMACUE: Hemoglobin: 9.3 g/dL — ABNORMAL LOW (ref 12.0–15.0)

## 2019-03-22 MED ORDER — EPOETIN ALFA 10000 UNIT/ML IJ SOLN: 30000.0000 [IU] | INTRAMUSCULAR | Status: DC

## 2019-03-22 MED ORDER — EPOETIN ALFA 10000 UNIT/ML IJ SOLN
INTRAMUSCULAR | Status: AC
  Administered 2019-03-22: 10000 [IU] via SUBCUTANEOUS
  Filled 2019-03-22: qty 1

## 2019-03-22 MED ORDER — EPOETIN ALFA 20000 UNIT/ML IJ SOLN
INTRAMUSCULAR | Status: AC
  Administered 2019-03-22: 20000 [IU] via SUBCUTANEOUS
  Filled 2019-03-22: qty 1

## 2019-03-23 LAB — PTH, INTACT AND CALCIUM
Calcium, Total (PTH): 7.4 mg/dL — ABNORMAL LOW (ref 8.7–10.3)
PTH: 149 pg/mL — ABNORMAL HIGH (ref 15–65)

## 2019-04-05 ENCOUNTER — Ambulatory Visit (HOSPITAL_COMMUNITY)
Admission: RE | Admit: 2019-04-05 | Discharge: 2019-04-05 | Disposition: A | Discharge status: Home / Self Care | Payer: Medicare HMO | Source: Ambulatory Visit | Attending: Nephrology | Admitting: Nephrology

## 2019-04-05 ENCOUNTER — Other Ambulatory Visit: Payer: Self-pay

## 2019-04-05 VITALS — BP 158/78 | HR 75 | Temp 96.6°F | Resp 20

## 2019-04-05 DIAGNOSIS — N183 Chronic kidney disease, stage 3 unspecified: Secondary | ICD-10-CM | POA: Insufficient documentation

## 2019-04-05 MED ORDER — EPOETIN ALFA 20000 UNIT/ML IJ SOLN
INTRAMUSCULAR | Status: AC
  Filled 2019-04-05: qty 1

## 2019-04-05 MED ORDER — EPOETIN ALFA 10000 UNIT/ML IJ SOLN
INTRAMUSCULAR | Status: AC
  Filled 2019-04-05: qty 1

## 2019-04-05 MED ORDER — EPOETIN ALFA 40000 UNIT/ML IJ SOLN
30000.0000 [IU] | INTRAMUSCULAR | Status: DC
  Administered 2019-04-05: 30000 [IU] via SUBCUTANEOUS

## 2019-04-06 LAB — POCT HEMOGLOBIN-HEMACUE: Hemoglobin: 9.8 g/dL — ABNORMAL LOW (ref 12.0–15.0)

## 2019-04-06 MED FILL — Epoetin Alfa Inj 10000 Unit/ML: INTRAMUSCULAR | Qty: 1 | Status: AC

## 2019-04-06 MED FILL — Epoetin Alfa Inj 20000 Unit/ML: INTRAMUSCULAR | Qty: 1 | Status: AC

## 2019-04-12 NOTE — Progress Notes (Signed)
@Patient  ID: Joann Gutierrez, female    DOB: [birth year], 74 y.o.   MRN: [id number]  Chief Complaint  Patient presents with   Follow-up    F/U on breathing. Per husband, her breathing has gotten worse. More SOB. Using 2L during the day and night.     Referring provider: Seward Carol, MD  HPI:  74 year old female former smoker followed in our office for asthma, obstructive sleep apnea, obesity hypoventilation syndrome, elevated right hemidiaphragm (first noted November 2011)  PMH: CKD stage III, postherpetic neuralgia, GERD, gastroparesis, T2DM, esophageal dysmotility, morbid obesity Smoker/ Smoking History: Former smoker.  Quit in 1982.  5 pack years Maintenance:  Breo 100  Pt of: Dr. Melvyn Novas  04/13/2019  - Visit   Patient presenting to office today as a follow-up visit.  She has not been seen since 2019.  Patient is followed in our office by Dr. Melvyn Novas for asthma as well as obstructive sleep apnea by Dr. Halford Chessman.  She is managed on a CPAP.  CPAP compliance report is listed below:  03/13/2019-04/11/2019-CPAP compliance report-16 on the last 30 days used, only 2 of those days greater than 4 hours, average usage 1 hours and 29 minutes, CPAP set pressure of 9, AHI 9.3  Patient reports that she is excellent compliance and she uses it every night without fail.  Patient's caregiver who presents today with her reports that she does not use it.  CPAP compliance report clearly shows poor compliance we will discuss this today.  There continues to be confusion regarding the patient's management of her medications.  She reports adherence to her Trelegy Ellipta inhaler.  I last refill was in October/2020 and it was for 60 days.  So she would have ran out by now.  She is confused on whether or not she is still maintained on it.  Patient is managing her meds on her own.  She is now following with Dr. Arline Asp as her new PCP with [facility].  Patient is also currently doing a fast with her religious  organization.  She reports that she is only eating vegetables.  She is unsure what times she can eat.  She is not very detailed regarding what this fast entails.  Apparently this was discussed at last office visit with her PCP and they encouraged her to keep eating meals given the fact that she is a type II diabetic and she is on insulin.  Apparently the pastor at her church has also encouraged her to not do the fast.  Patient remains fasting this time.  Questionaires / Pulmonary Flowsheets:   MMRC: mMRC Dyspnea Scale mMRC Score  04/13/2019 2    Tests:   Pulmonary tests:  Spirometry 07/05/10 >> FEV1 1.30 (64%), FEV1% 80 >> coughing during test RAST 12/11/12 >> negative, IgE 63.3 PFT 03/17/13 >> FEV1 1.27 (67%), FEV1% 84, TLC 4.06 (80%), DLCO 57%, borderline BD  Sleep tests:  PSG 04/28/09 >> AHI 11, SpO2 low 68% ONO with CPAP and room air 10/22/12 >> Test time 10 hrs 59 min. Mean SpO2 92.4%, low SpO2 75%. Spent 16 min with SpO2 < 89%. CPAP 11/10/17 to 11/23/17 >> used on 12 of 14 nights with average 6 hrs 38 min.  Average AHI 7 with CPAP 9 cm H2O  Cardiac tests:  Lt, Rt Heart cath 03/03/13 >> RA 10, RV 38/7, PA 41/16 28, PCWP 13, LV 142/5, AO 137/76 Echo 09/08/17 >> EF 65 to 70%, grade 1 DD, PAS 39 mmHg  FENO:  No results found for: NITRICOXIDE  PFT: PFT Results Latest Ref Rng & Units 03/17/2013  FVC-Pre L 1.44  FVC-Predicted Pre % 58  FVC-Post L 1.51  FVC-Predicted Post % 61  Pre FEV1/FVC % % 80  Post FEV1/FCV % % 84  FEV1-Pre L 1.15  FEV1-Predicted Pre % 60  FEV1-Post L 1.27  DLCO UNC% % 57  DLCO COR %Predicted % 101  TLC L 4.06  TLC % Predicted % 80  RV % Predicted % 100    WALK:  No flowsheet data found.  Imaging: US Abdomen Complete  Result Date: 03/19/2019 CLINICAL DATA:  Chronic kidney disease EXAM: ABDOMEN ULTRASOUND COMPLETE COMPARISON:  09/07/2017, 09/05/2017 FINDINGS: Gallbladder: 6 mm stone at the gallbladder fundus which appears nonmobile. No wall thickening  visualized. No sonographic Murphy sign noted by sonographer. Common bile duct: Diameter: 5 mm Liver: No focal lesion identified. Mildly heterogeneous hepatic parenchyma without increased parenchymal echogenicity. Portal vein is patent on color Doppler imaging with normal direction of blood flow towards the liver. IVC: No abnormality visualized. Pancreas: Visualized portion unremarkable. Spleen: Size and appearance within normal limits. Right Kidney: Length: 8.6 cm. Increased renal parenchymal echogenicity. 1.8 cm midpole renal cyst. No solid mass or hydronephrosis visualized. Left Kidney: Length: 9.4 cm. Increased renal parenchymal echogenicity. Exophytic 3.4 cm inferior pole left renal cyst. No solid mass or hydronephrosis visualized. Abdominal aorta: No aneurysm visualized. Other findings: None. IMPRESSION: 1. No obstructive uropathy. 2. Findings of chronic medical renal disease. 3. Non mobile 6 mm calculus at the gallbladder fundus. No secondary sonographic findings to suggest cholecystitis. 4. Mildly heterogeneous hepatic parenchyma, nonspecific. Correlate with liver function tests. Electronically Signed   By: Davina Poke D.O.   On: 03/19/2019 16:04    Lab Results:  CBC    Component Value Date/Time   WBC 7.2 11/20/2017 1020   RBC 4.47 11/20/2017 1020   HGB 9.8 (L) 04/05/2019 1149   HCT 34.0 (L) 11/16/2018 0653   PLT 210.0 11/20/2017 1020   MCV 71.8 (L) 11/20/2017 1020   MCH 22.2 (L) 09/11/2017 0218   MCHC 31.2 11/20/2017 1020   RDW 19.9 (H) 11/20/2017 1020   LYMPHSABS 2.0 11/20/2017 1020   MONOABS 1.2 (H) 11/20/2017 1020   EOSABS 0.0 11/20/2017 1020   BASOSABS 0.1 11/20/2017 1020    BMET    Component Value Date/Time   NA 142 03/22/2019 1246   K 4.8 03/22/2019 1246   CL 108 03/22/2019 1246   CO2 22 03/22/2019 1246   GLUCOSE 90 03/22/2019 1246   BUN 50 (H) 03/22/2019 1246   CREATININE 4.86 (H) 03/22/2019 1246   CREATININE 1.36 (H) 07/24/2011 0925   CALCIUM 7.4 (L) 03/22/2019  1246   CALCIUM 7.4 (L) 03/22/2019 1246   GFRNONAA 8 (L) 03/22/2019 1246   GFRAA 10 (L) 03/22/2019 1246    BNP    Component Value Date/Time   BNP 58.5 09/04/2017 2139    ProBNP    Component Value Date/Time   PROBNP 65.0 05/20/2014 1620    Specialty Problems      Pulmonary Problems   Chronic obstructive asthma ([place])    Spirometry 07/05/10 >> FEV1 1.30 (64%), FEV1% 80  - Spirometry 02/06/2017  FEV1 0.88 (49%)  Ratio 63 with f/v loop non-physiologic but min curvature in effort indep portion  - 11/20/2017  After extensive coaching inhaler device,  effectiveness =    75% with baseline 0  Allergy profile 11/20/17  >  Eos 0.0/  IgE  35 RAST  pos roach only             Obstructive sleep apnea    CPAP 9 cm H2O with 2 liters oxygen       Chronic respiratory failure (HCC)   DOE (dyspnea on exertion)    Spirometry 07/05/10 >> FEV1 1.30 (64%), FEV1% 80  -09/22/2014   Walked RA x one lap @ 185 stopped due to  Sob/ slow pace, no desat  - 11/20/2017   Walked RA x one lap @ 185 stopped due to  Sob slow pace, no desat        Cough   Acute asthmatic bronchitis    Spirometry 07/05/10 >> FEV1 1.30 (64%), FEV1% 80  05/20/2014 p extensive coaching HFA effectiveness =    50%       Throat congestion      Allergies  Allergen Reactions   Morphine And Related Other (See Comments)    Family request not to be given, reports pt does not wake up when given    Promethazine Hcl Other (See Comments)    REACTION: lethargy    Immunization History  Administered Date(s) Administered   Influenza Split 10/16/2010, 11/10/2012   Influenza Whole 01/02/2009, 11/02/2009, 10/31/2011   Influenza, High Dose Seasonal PF 11/04/2017   Influenza,inj,Quad PF,6+ Mos 09/24/2013, 10/16/2015   Influenza-Unspecified 11/21/2016   Pneumococcal Conjugate-13 10/16/2015   Pneumococcal Polysaccharide-23 10/22/2002, 10/21/2009, 09/12/2013, 09/07/2017   Td 08/22/2002    Past Medical History:  Diagnosis Date    Adenomatous colon polyp    Allergy    Anemia    Asthma        CAD (coronary artery disease)    Mild very minimal coronary disease with 20% obtuse marginal stenosis   Carpal tunnel syndrome on left    Cataract    CHF (congestive heart failure) (HCC)    Chronic kidney disease (CKD), stage III (moderate)    now stage 4   COPD (chronic obstructive pulmonary disease) (HCC)    Depression    Diabetes mellitus 1997   Type II    Diverticulosis    Dyspnea    Elevated diaphragm November 2011   Right side   Esophageal dysmotility    Esophageal stricture    Gastritis    Gastroparesis 08/21/2007   GERD (gastroesophageal reflux disease)    Hearing loss of both ears    Hernia, hiatal    Hyperlipidemia    Hypertension    Morbid obesity ([place])    OSTEOARTHRITIS 08/09/2006   Osteoporosis    Oxygen deficiency    prn    PERIPHERAL NEUROPATHY, FEET 09/23/2007   RENAL INSUFFICIENCY 02/16/2009   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   Seizures ([place])    pt thinks it has been several monthes since she had a seisure   Shingles    Sickle cell trait ([place])    Sleep apnea    uses cpap   Stroke ([place])     Tobacco History: Social History   Tobacco Use  Smoking Status Former Smoker   Packs/day: 0.50   Years: 10.00   Pack years: 5.00   Quit date: 04/18/1980   Years since quitting: 39.0  Smokeless Tobacco Never Used   Counseling given: Yes   Continue to not smoke  Outpatient Encounter Medications as of 04/13/2019  Medication Sig   albuterol (PROVENTIL HFA;VENTOLIN HFA) 108 (90 Base) MCG/ACT inhaler Inhale 2 puffs into the lungs every 6 (six) hours as needed for wheezing or shortness of breath.  amLODipine (NORVASC) 10 MG tablet Take 10 mg by mouth every evening.   aspirin EC 81 MG tablet Take 81 mg by mouth daily.   atorvastatin (LIPITOR) 40 MG tablet    azelastine (ASTELIN) 0.1 % nasal spray Place 1 spray into both nostrils 2 (two) times  daily as needed (allergies). Use in each nostril as directed    carvedilol (COREG) 25 MG tablet Take 25 mg by mouth 2 (two) times daily.   donepezil (ARICEPT) 10 MG tablet Take 10 mg by mouth at bedtime.   doxazosin (CARDURA) 2 MG tablet    ezetimibe (ZETIA) 10 MG tablet Take 10 mg by mouth daily.    Fluticasone-Umeclidin-Vilant (TRELEGY ELLIPTA) 100-62.5-25 MCG/INH AEPB Take 1 puff by mouth daily.   furosemide (LASIX) 80 MG tablet Take 80 mg by mouth 2 (two) times daily.   gabapentin (NEURONTIN) 100 MG capsule Take 1 capsule (100 mg total) by mouth 2 (two) times daily.   insulin aspart (NOVOLOG) 100 UNIT/ML injection Inject 5 Units into the skin 3 (three) times daily with meals. (Patient taking differently: Inject 0-15 Units into the skin 3 (three) times daily with meals. )   isosorbide mononitrate (IMDUR) 120 MG 24 hr tablet isosorbide mononitrate ER 120 mg tablet,extended release 24 hr   ketorolac (ACULAR) 0.4 % SOLN Place 1 drop into the left eye 2 (two) times a day.   LEVEMIR FLEXTOUCH 100 UNIT/ML Pen Inject 18 Units into the skin daily. (Patient taking differently: Inject 30 Units into the skin daily. )   losartan (COZAAR) 100 MG tablet Take 100 mg by mouth daily.   montelukast (SINGULAIR) 10 MG tablet Take 1 tablet (10 mg total) by mouth daily. (Patient taking differently: Take 10 mg by mouth at bedtime. )   nitroGLYCERIN (NITROSTAT) 0.4 MG SL tablet PLACE ONE TABLET UNDER THE TONGUE EVERY FIVE MINUTES AS NEEDED FOR CHEST PAIN   NOVOLOG FLEXPEN 100 UNIT/ML FlexPen INJECT 5 UNITS SUBCUTANEOUSLY 3 TIMES DAILY AS DIRECTED SLIDING SCALE   omeprazole (PRILOSEC) 40 MG capsule TAKE 1 CAPSULE BY MOUTH 2 TIMES DAILY   oxyCODONE (ROXICODONE) 5 MG immediate release tablet Take 1 tablet (5 mg total) by mouth every 4 (four) hours as needed.   OXYGEN Inhale 2 L into the lungs as needed. CPAP with oxygen at bedtime    polyethylene glycol (MIRALAX) 17 g packet Take 17 g by mouth daily as  needed. (Patient taking differently: Take 17 g by mouth daily as needed (constipation.). )   raloxifene (EVISTA) 60 MG tablet Take 60 mg by mouth every evening.    spironolactone (ALDACTONE) 25 MG tablet Take 25 mg by mouth daily.   TRUEPLUS PEN NEEDLES 32G X 4 MM MISC INJECT THREE TIMES A DAY AS DIRECTED   Vitamin D, Ergocalciferol, (DRISDOL) 50000 UNITS CAPS capsule Take 50,000 Units by mouth every Friday.    [DISCONTINUED] TRELEGY ELLIPTA 100-62.5-25 MCG/INH AEPB inhale 1 PUFF into THE lungs daily   No facility-administered encounter medications on file as of 04/13/2019.     Review of Systems  Review of Systems  Constitutional: Positive for fatigue. Negative for activity change and fever.  HENT: Negative for sinus pressure, sinus pain and sore throat.   Respiratory: Positive for shortness of breath. Negative for cough and wheezing.   Cardiovascular: Negative for chest pain and palpitations.  Gastrointestinal: Negative for diarrhea, nausea and vomiting.  Musculoskeletal: Negative for arthralgias.  Neurological: Negative for dizziness.  Psychiatric/Behavioral: Positive for confusion. Negative for sleep disturbance. The patient  is not nervous/anxious.      Physical Exam  BP 126/74    Pulse 79    Temp 98.4 F (36.9 C) (Temporal)    Ht 5' 4.5" (1.638 m)    Wt 265 lb 6.4 oz (120.4 kg)    SpO2 96% Comment: on RA   BMI 44.85 kg/m   Wt Readings from Last 5 Encounters:  04/13/19 265 lb 6.4 oz (120.4 kg)  01/13/19 263 lb 12.8 oz (119.7 kg)  11/16/18 261 lb 6.4 oz (118.6 kg)  11/04/18 261 lb 6.4 oz (118.6 kg)  08/03/18 272 lb 0.8 oz (123.4 kg)    BMI Readings from Last 5 Encounters:  04/13/19 44.85 kg/m  01/13/19 44.58 kg/m  11/16/18 44.18 kg/m  11/04/18 44.18 kg/m  08/03/18 45.98 kg/m     Physical Exam Vitals and nursing note reviewed.  Constitutional:      General: She is not in acute distress.    Appearance: Normal appearance. She is obese.  HENT:     Head:  Normocephalic and atraumatic.     Right Ear: Tympanic membrane, ear canal and external ear normal. There is no impacted cerumen.     Left Ear: Tympanic membrane, ear canal and external ear normal. There is no impacted cerumen.     Nose: Nose normal. No congestion.     Mouth/Throat:     Mouth: Mucous membranes are moist.     Pharynx: Oropharynx is clear.  Eyes:     Pupils: Pupils are equal, round, and reactive to light.  Cardiovascular:     Rate and Rhythm: Normal rate and regular rhythm.     Pulses: Normal pulses.     Heart sounds: Normal heart sounds. No murmur.  Pulmonary:     Effort: Pulmonary effort is normal. No respiratory distress.     Breath sounds: Normal breath sounds. No decreased air movement. No decreased breath sounds, wheezing or rales.  Musculoskeletal:     Cervical back: Normal range of motion.     Right lower leg: Edema (3+) present.     Left lower leg: Edema (3+) present.  Skin:    General: Skin is warm and dry.     Capillary Refill: Capillary refill takes less than 2 seconds.  Neurological:     General: No focal deficit present.     Mental Status: She is alert and oriented to person, place, and time. Mental status is at baseline.     Motor: Weakness present.     Gait: Gait abnormal.  Psychiatric:        Mood and Affect: Mood normal. Affect is flat.        Speech: Speech is delayed.        Behavior: Behavior is slowed.        Thought Content: Thought content normal.        Cognition and Memory: Cognition is impaired.        Judgment: Judgment normal.       Assessment & Plan:   Chronic obstructive asthma ([place]) Explained to the patient that I will be very difficult for [place] to manage her breathing if she continues to be noncompliant with management of her inhalers  Plan: We will restart patient on Trelegy Ellipta We will coordinate a clinical pharmacy team visit to review inhaler usage 74-month follow-up with Dr. Melvyn Novas  Essential  hypertension Plan: Continue follow-up with cardiology  Obstructive sleep apnea Noncompliant with CPAP use Reviewed with patient today Patient is unable to give  me a reason for why she is not using it.  Despite reviewing the compliance report with the patient as well as her caregiver confirming the compliance report patient is adamant that she uses her CPAP every night.  Plan: Encourage patient to resume CPAP use that way we can get an adequate compliance report and make changes based off that 63-month follow-up with Dr. Halford Chessman  CKD (chronic kidney disease), stage III [facility]) Plan: Continue Lasix Continue follow-up with nephrology  Medication management Plan: Present to primary care office and discussed with them help with coordinating your medications You may benefit from home health  We will coordinate clinical pharmacy team visit to review medications and can perform a med reconciliation  Polypharmacy High risk for polypharmacy Lots of confusion regarding patient's medications Patient does not take medications as prescribed  Plan: We will coordinate clinical pharmacy team visit to review polypharmacy with patient Encourage patient to bring all of her medications to that visit  Ineffective health maintenance Patient continues to struggle with management of her meds as well as her overall health management.  This continues to be a large barrier for the patient.  I have encouraged her to contact Dr. Arline Asp her new PCP regarding this.  She may benefit from home health.  She needs additional resources for support of management of her care at home.    Return in about 3 months (around 07/14/2019), or if symptoms worsen or fail to improve, for Follow up with Dr. Melvyn Novas.   Lauraine Rinne, NP 04/13/2019   This appointment required 42 minutes of patient care (this includes precharting, chart review, review of results, face-to-face care, etc.).

## 2019-04-13 ENCOUNTER — Other Ambulatory Visit: Payer: Self-pay

## 2019-04-13 ENCOUNTER — Encounter: Payer: Self-pay | Admitting: Pulmonary Disease

## 2019-04-13 ENCOUNTER — Ambulatory Visit (INDEPENDENT_AMBULATORY_CARE_PROVIDER_SITE_OTHER): Payer: Medicare HMO | Admitting: Pulmonary Disease

## 2019-04-13 VITALS — BP 126/74 | HR 79 | Temp 98.4°F | Ht 64.5 in | Wt 265.4 lb

## 2019-04-13 DIAGNOSIS — Z79899 Other long term (current) drug therapy: Secondary | ICD-10-CM | POA: Diagnosis not present

## 2019-04-13 DIAGNOSIS — G4733 Obstructive sleep apnea (adult) (pediatric): Secondary | ICD-10-CM | POA: Diagnosis not present

## 2019-04-13 DIAGNOSIS — Z789 Other specified health status: Secondary | ICD-10-CM | POA: Diagnosis not present

## 2019-04-13 DIAGNOSIS — N183 Chronic kidney disease, stage 3 unspecified: Secondary | ICD-10-CM

## 2019-04-13 DIAGNOSIS — I10 Essential (primary) hypertension: Secondary | ICD-10-CM

## 2019-04-13 DIAGNOSIS — J449 Chronic obstructive pulmonary disease, unspecified: Secondary | ICD-10-CM | POA: Diagnosis not present

## 2019-04-13 DIAGNOSIS — J4489 Other specified chronic obstructive pulmonary disease: Secondary | ICD-10-CM

## 2019-04-13 MED ORDER — TRELEGY ELLIPTA 100-62.5-25 MCG/INH IN AEPB: 1.0000 | INHALATION_SPRAY | Freq: Every day | RESPIRATORY_TRACT | 5 refills | Status: DC

## 2019-04-13 NOTE — Patient Instructions (Addendum)
You were seen today by Lauraine Rinne, NP  for:   It was good seeing you today.  It is very important that you resume using your CPAP for management of your obstructive sleep apnea.  You also should take your Trelegy Ellipta every day.  We have refilled this today.  We will bring you back in to see our pharmacy team in 6 to 8 weeks for them to review your medications with you.  Please bring all of your medications to this appointment.  Please also follow back up with primary care and discuss with them that you need help with management of your medications as well as coordination of your health care at home.  We will see you back in our office in 3 to 6 months with Dr. Melvyn Novas and Dr. Halford Chessman.  Take care and stay safe, next Joann Gutierrez  1. Chronic obstructive asthma (HCC)  Resume Trelegy Ellipta  >>> 1 puff daily in the morning >>>rinse mouth out after use  >>> This inhaler contains 3 medications that help manage her respiratory status, contact our office if you cannot afford this medication or unable to remain on this medication   2. Obstructive sleep apnea  We recommend that you continue using your CPAP daily >>>Keep up the hard work using your device >>> Goal should be wearing this for the entire night that you are sleeping, at least 4 to 6 hours  Remember:  . Do not drive or operate heavy machinery if tired or drowsy.  . Please notify the supply company and office if you are unable to use your device regularly due to missing supplies or machine being broken.  . Work on maintaining a healthy weight and following your recommended nutrition plan  . Maintain proper daily exercise and movement  . Maintaining proper use of your device can also help improve management of other chronic illnesses such as: Blood pressure, blood sugars, and weight management.   BiPAP/ CPAP Cleaning:  >>>Clean weekly, with Dawn soap, and bottle brush.  Set up to air dry. >>> Wipe mask out daily with wet wipe or  towelette   3. Polypharmacy 4. Medication management  Please present to our office in 6-8 weeks for an appointment with the clinical pharmacy team for:  Marland Kitchen Medication Management  . Medication reconciliation  . Inhaler teaching   Please speak with primary care about getting set up for home health to help with your medications as well as management of your health care  5. Ineffective health maintenance  Please schedule a follow-up with primary care  You would benefit from home health care, they should be able to help coordinate this  I believe you likely should be seen by them at least every 4 to 6 weeks  6. Stage 3 chronic kidney disease, unspecified whether stage 3a or 3b CKD  Keep follow-up with nephrology  7. Essential hypertension  Keep follow-up with cardiology   Follow Up:    Return in about 3 months (around 07/14/2019), or if symptoms worsen or fail to improve, for Follow up with Dr. Melvyn Novas.  3 month recall with Dr. Halford Chessman   Please present to our office in 6-8 weeks for an appointment with the clinical pharmacy team for:  Marland Kitchen Medication Management  . Medication reconciliation  . Inhaler teaching   Please do your part to reduce the spread of COVID-19:      Reduce your risk of any infection  and COVID19 by using the similar precautions used  for avoiding the common cold or flu:  Marland Kitchen Wash your hands often with soap and warm water for at least 20 seconds.  If soap and water are not readily available, use an alcohol-based hand sanitizer with at least 60% alcohol.  . If coughing or sneezing, cover your mouth and nose by coughing or sneezing into the elbow areas of your shirt or coat, into a tissue or into your sleeve (not your hands). Langley Gauss A MASK when in public  . Avoid shaking hands with others and consider head nods or verbal greetings only. . Avoid touching your eyes, nose, or mouth with unwashed hands.  . Avoid close contact with people who are sick. . Avoid places or  events with large numbers of people in one location, like concerts or sporting events. . If you have some symptoms but not all symptoms, continue to monitor at home and seek medical attention if your symptoms worsen. . If you are having a medical emergency, call 911.   [place] / e-Visit: [email or web address]         [facility]: [facility]: [phone]                   [facility]: [phone]     It is flu season:   >>> Best ways to protect herself from the flu: Receive the yearly flu vaccine, practice good hand hygiene washing with soap and also using hand sanitizer when available, eat a nutritious meals, get adequate rest, hydrate appropriately   Please contact the office if your symptoms worsen or you have concerns that you are not improving.   Thank you for choosing [facility] for your healthcare, and for allowing [place] to partner with you on your healthcare journey. I am thankful to be able to provide care to you today.   Wyn Quaker FNP-C

## 2019-04-13 NOTE — Assessment & Plan Note (Signed)
Plan: Present to primary care office and discussed with them help with coordinating your medications You may benefit from home health  We will coordinate clinical pharmacy team visit to review medications and can perform a med reconciliation

## 2019-04-13 NOTE — Assessment & Plan Note (Signed)
High risk for polypharmacy Lots of confusion regarding patient's medications Patient does not take medications as prescribed  Plan: We will coordinate clinical pharmacy team visit to review polypharmacy with patient Encourage patient to bring all of her medications to that visit

## 2019-04-13 NOTE — Assessment & Plan Note (Signed)
Plan: Continue follow-up with cardiology 

## 2019-04-13 NOTE — Assessment & Plan Note (Signed)
Explained to the patient that I will be very difficult for [place] to manage her breathing if she continues to be noncompliant with management of her inhalers  Plan: We will restart patient on Trelegy Ellipta We will coordinate a clinical pharmacy team visit to review inhaler usage 51-month follow-up with Dr. Melvyn Novas

## 2019-04-13 NOTE — Assessment & Plan Note (Signed)
Plan: Continue Lasix Continue follow-up with nephrology

## 2019-04-13 NOTE — Progress Notes (Signed)
Reviewed and agree with assessment/plan.   Dontreal Miera, MD [place] [facility] 01/17/2016, 12:24 PM Pager:  [phone]  

## 2019-04-13 NOTE — Assessment & Plan Note (Signed)
Noncompliant with CPAP use Reviewed with patient today Patient is unable to give me a reason for why she is not using it.  Despite reviewing the compliance report with the patient as well as her caregiver confirming the compliance report patient is adamant that she uses her CPAP every night.  Plan: Encourage patient to resume CPAP use that way we can get an adequate compliance report and make changes based off that 62-month follow-up with Dr. Halford Chessman

## 2019-04-13 NOTE — Assessment & Plan Note (Signed)
Patient continues to struggle with management of her meds as well as her overall health management.  This continues to be a large barrier for the patient.  I have encouraged her to contact Dr. Arline Asp her new PCP regarding this.  She may benefit from home health.  She needs additional resources for support of management of her care at home.

## 2019-04-19 ENCOUNTER — Other Ambulatory Visit: Payer: Self-pay

## 2019-04-19 ENCOUNTER — Ambulatory Visit (HOSPITAL_COMMUNITY)
Admission: RE | Admit: 2019-04-19 | Discharge: 2019-04-19 | Disposition: A | Discharge status: Home / Self Care | Payer: Medicare HMO | Source: Ambulatory Visit | Attending: Nephrology | Admitting: Nephrology

## 2019-04-19 VITALS — BP 153/68 | HR 76 | Temp 97.2°F | Resp 20

## 2019-04-19 DIAGNOSIS — N183 Chronic kidney disease, stage 3 unspecified: Secondary | ICD-10-CM | POA: Insufficient documentation

## 2019-04-19 LAB — POCT HEMOGLOBIN-HEMACUE: Hemoglobin: 10 g/dL — ABNORMAL LOW (ref 12.0–15.0)

## 2019-04-19 MED ORDER — EPOETIN ALFA 40000 UNIT/ML IJ SOLN: 30000.0000 [IU] | INTRAMUSCULAR | Status: DC

## 2019-04-19 MED ORDER — EPOETIN ALFA 20000 UNIT/ML IJ SOLN
INTRAMUSCULAR | Status: AC
  Administered 2019-04-19: 20000 [IU] via SUBCUTANEOUS
  Filled 2019-04-19: qty 1

## 2019-04-19 MED ORDER — EPOETIN ALFA 10000 UNIT/ML IJ SOLN
INTRAMUSCULAR | Status: AC
  Administered 2019-04-19: 10000 [IU] via SUBCUTANEOUS
  Filled 2019-04-19: qty 1

## 2019-05-03 ENCOUNTER — Other Ambulatory Visit: Payer: Self-pay

## 2019-05-03 ENCOUNTER — Encounter (HOSPITAL_COMMUNITY)
Admission: RE | Admit: 2019-05-03 | Discharge: 2019-05-03 | Disposition: A | Discharge status: Home / Self Care | Payer: Medicare HMO | Source: Ambulatory Visit | Attending: Nephrology | Admitting: Nephrology

## 2019-05-03 VITALS — BP 163/72 | HR 73 | Resp 20

## 2019-05-03 DIAGNOSIS — D631 Anemia in chronic kidney disease: Secondary | ICD-10-CM | POA: Insufficient documentation

## 2019-05-03 DIAGNOSIS — N183 Chronic kidney disease, stage 3 unspecified: Secondary | ICD-10-CM | POA: Diagnosis not present

## 2019-05-03 LAB — RENAL FUNCTION PANEL
Albumin: 3.2 g/dL — ABNORMAL LOW (ref 3.5–5.0)
Anion gap: 11 (ref 5–15)
BUN: 37 mg/dL — ABNORMAL HIGH (ref 8–23)
CO2: 18 mmol/L — ABNORMAL LOW (ref 22–32)
Calcium: 8.4 mg/dL — ABNORMAL LOW (ref 8.9–10.3)
Chloride: 114 mmol/L — ABNORMAL HIGH (ref 98–111)
Creatinine, Ser: 3.03 mg/dL — ABNORMAL HIGH (ref 0.44–1.00)
GFR calc Af Amer: 17 mL/min — ABNORMAL LOW (ref >60)
GFR calc non Af Amer: 15 mL/min — ABNORMAL LOW (ref >60)
Glucose, Bld: 222 mg/dL — ABNORMAL HIGH (ref 70–99)
Phosphorus: 3.2 mg/dL (ref 2.5–4.6)
Potassium: 4 mmol/L (ref 3.5–5.1)
Sodium: 143 mmol/L (ref 135–145)

## 2019-05-03 LAB — IRON AND TIBC
Iron: 132 ug/dL (ref 28–170)
Saturation Ratios: 69 % — ABNORMAL HIGH (ref 10.4–31.8)
TIBC: 190 ug/dL — ABNORMAL LOW (ref 250–450)
UIBC: 58 ug/dL

## 2019-05-03 LAB — FERRITIN: Ferritin: 378 ng/mL — ABNORMAL HIGH (ref 11–307)

## 2019-05-03 LAB — POCT HEMOGLOBIN-HEMACUE: Hemoglobin: 9 g/dL — ABNORMAL LOW (ref 12.0–15.0)

## 2019-05-03 MED ORDER — EPOETIN ALFA 40000 UNIT/ML IJ SOLN: 30000.0000 [IU] | INTRAMUSCULAR | Status: DC

## 2019-05-03 MED ORDER — EPOETIN ALFA 20000 UNIT/ML IJ SOLN
INTRAMUSCULAR | Status: AC
  Administered 2019-05-03: 20000 [IU]
  Filled 2019-05-03: qty 1

## 2019-05-03 MED ORDER — EPOETIN ALFA 10000 UNIT/ML IJ SOLN
INTRAMUSCULAR | Status: AC
  Administered 2019-05-03: 10000 [IU]
  Filled 2019-05-03: qty 1

## 2019-05-04 LAB — PTH, INTACT AND CALCIUM
Calcium, Total (PTH): 8.5 mg/dL — ABNORMAL LOW (ref 8.7–10.3)
PTH: 110 pg/mL — ABNORMAL HIGH (ref 15–65)

## 2019-05-17 ENCOUNTER — Encounter (HOSPITAL_COMMUNITY)
Admission: RE | Admit: 2019-05-17 | Discharge: 2019-05-17 | Disposition: A | Discharge status: Home / Self Care | Payer: Medicare HMO | Source: Ambulatory Visit | Attending: Nephrology | Admitting: Nephrology

## 2019-05-17 ENCOUNTER — Other Ambulatory Visit: Payer: Self-pay

## 2019-05-17 VITALS — BP 169/73 | HR 79 | Temp 97.8°F | Resp 20

## 2019-05-17 DIAGNOSIS — N183 Chronic kidney disease, stage 3 unspecified: Secondary | ICD-10-CM

## 2019-05-17 LAB — POCT HEMOGLOBIN-HEMACUE: Hemoglobin: 9.8 g/dL — ABNORMAL LOW (ref 12.0–15.0)

## 2019-05-17 MED ORDER — EPOETIN ALFA 10000 UNIT/ML IJ SOLN
INTRAMUSCULAR | Status: AC
  Administered 2019-05-17: 13:00:00 30000 [IU] via SUBCUTANEOUS
  Filled 2019-05-17: qty 1

## 2019-05-17 MED ORDER — EPOETIN ALFA 10000 UNIT/ML IJ SOLN: 30000.0000 [IU] | INTRAMUSCULAR | Status: DC

## 2019-05-17 MED ORDER — EPOETIN ALFA 20000 UNIT/ML IJ SOLN
INTRAMUSCULAR | Status: AC
  Filled 2019-05-17: qty 1

## 2019-05-18 MED FILL — Epoetin Alfa Inj 20000 Unit/ML: INTRAMUSCULAR | Qty: 1 | Status: AC

## 2019-05-18 MED FILL — Epoetin Alfa Inj 10000 Unit/ML: INTRAMUSCULAR | Qty: 1 | Status: AC

## 2019-05-25 ENCOUNTER — Other Ambulatory Visit: Payer: Self-pay

## 2019-05-25 ENCOUNTER — Ambulatory Visit: Payer: Medicare HMO | Admitting: Pharmacist

## 2019-05-25 DIAGNOSIS — Z79899 Other long term (current) drug therapy: Secondary | ICD-10-CM

## 2019-05-25 NOTE — Progress Notes (Signed)
HPI Patient presents today to [facility] to see pharmacy team for medication reconciliation.  She was accompanied by caregiver, Konrad Dolores. Past medical history includes chronic obstructive out,00sa, hypertension, gastroparesis, GERD, insulin-dependent diabetes, peripheral neuropathy, postherpetic neuralgia, CKD, and anemia.   Respiratory Medications Current: Trelegy Ellipta and albuterol as needed Patient reports no known adherence challenges  OBJECTIVE Allergies  Allergen Reactions  . Morphine And Related Other (See Comments)    Family request not to be given, reports pt does not wake up when given   . Promethazine Hcl Other (See Comments)    REACTION: lethargy    Outpatient Encounter Medications as of 05/25/2019  Medication Sig Note  . albuterol (PROVENTIL HFA;VENTOLIN HFA) 108 (90 Base) MCG/ACT inhaler Inhale 2 puffs into the lungs every 6 (six) hours as needed for wheezing or shortness of breath.   Marland Kitchen amLODipine (NORVASC) 10 MG tablet Take 10 mg by mouth every evening.   Marland Kitchen aspirin EC 81 MG tablet Take 81 mg by mouth daily.   Marland Kitchen atorvastatin (LIPITOR) 40 MG tablet    . azelastine (ASTELIN) 0.1 % nasal spray Place 1 spray into both nostrils 2 (two) times daily as needed (allergies). Use in each nostril as directed    . carvedilol (COREG) 25 MG tablet Take 25 mg by mouth 2 (two) times daily.   Marland Kitchen donepezil (ARICEPT) 10 MG tablet Take 10 mg by mouth at bedtime.   Marland Kitchen doxazosin (CARDURA) 2 MG tablet    . ezetimibe (ZETIA) 10 MG tablet Take 10 mg by mouth daily.    . Fluticasone-Umeclidin-Vilant (TRELEGY ELLIPTA) 100-62.5-25 MCG/INH AEPB Take 1 puff by mouth daily.   . furosemide (LASIX) 80 MG tablet Take 80 mg by mouth 2 (two) times daily.   Marland Kitchen gabapentin (NEURONTIN) 100 MG capsule Take 1 capsule (100 mg total) by mouth 2 (two) times daily.   . insulin aspart (NOVOLOG) 100 UNIT/ML injection Inject 5 Units into the skin 3 (three) times daily with meals. (Patient taking differently: Inject  0-15 Units into the skin 3 (three) times daily with meals. )   . isosorbide mononitrate (IMDUR) 120 MG 24 hr tablet isosorbide mononitrate ER 120 mg tablet,extended release 24 hr   . ketorolac (ACULAR) 0.4 % SOLN Place 1 drop into the left eye 2 (two) times a day.   Marland Kitchen LEVEMIR FLEXTOUCH 100 UNIT/ML Pen Inject 18 Units into the skin daily. (Patient taking differently: Inject 30 Units into the skin daily. )   . losartan (COZAAR) 100 MG tablet Take 100 mg by mouth daily.   . montelukast (SINGULAIR) 10 MG tablet Take 1 tablet (10 mg total) by mouth daily. (Patient taking differently: Take 10 mg by mouth at bedtime. )   . nitroGLYCERIN (NITROSTAT) 0.4 MG SL tablet PLACE ONE TABLET UNDER THE TONGUE EVERY FIVE MINUTES AS NEEDED FOR CHEST PAIN   . NOVOLOG FLEXPEN 100 UNIT/ML FlexPen INJECT 5 UNITS SUBCUTANEOUSLY 3 TIMES DAILY AS DIRECTED SLIDING SCALE   . omeprazole (PRILOSEC) 40 MG capsule TAKE 1 CAPSULE BY MOUTH 2 TIMES DAILY   . oxyCODONE (ROXICODONE) 5 MG immediate release tablet Take 1 tablet (5 mg total) by mouth every 4 (four) hours as needed.   . OXYGEN Inhale 2 L into the lungs as needed. CPAP with oxygen at bedtime    . polyethylene glycol (MIRALAX) 17 g packet Take 17 g by mouth daily as needed. (Patient taking differently: Take 17 g by mouth daily as needed (constipation.). )   . raloxifene (EVISTA)  60 MG tablet Take 60 mg by mouth every evening.    Marland Kitchen spironolactone (ALDACTONE) 25 MG tablet Take 25 mg by mouth daily.   . TRUEPLUS PEN NEEDLES 32G X 4 MM MISC INJECT THREE TIMES A DAY AS DIRECTED 07/11/2017: With Levemir  . Vitamin D, Ergocalciferol, (DRISDOL) 50000 UNITS CAPS capsule Take 50,000 Units by mouth every Friday.     No facility-administered encounter medications on file as of 05/25/2019.     Immunization History  Administered Date(s) Administered  . Influenza Split 10/16/2010, 11/10/2012  . Influenza Whole 01/02/2009, 11/02/2009, 10/31/2011  . Influenza, High Dose Seasonal PF  11/04/2017  . Influenza,inj,Quad PF,6+ Mos 09/24/2013, 10/16/2015  . Influenza-Unspecified 11/21/2016  . Pneumococcal Conjugate-13 10/16/2015  . Pneumococcal Polysaccharide-23 10/22/2002, 10/21/2009, 09/12/2013, 09/07/2017  . Td 08/22/2002     PFTs PFT Results Latest Ref Rng & Units 03/17/2013  FVC-Pre L 1.44  FVC-Predicted Pre % 58  FVC-Post L 1.51  FVC-Predicted Post % 61  Pre FEV1/FVC % % 80  Post FEV1/FCV % % 84  FEV1-Pre L 1.15  FEV1-Predicted Pre % 60  FEV1-Post L 1.27  DLCO UNC% % 57  DLCO COR %Predicted % 101  TLC L 4.06  TLC % Predicted % 80  RV % Predicted % 100     Assessment   1. Medication Reconciliation  Patient utilizes currently pharmacy who fills prescriptions utilizing a pill pack.  Patient brought a pill pack along with over-the-counter medications to the appointment.  A drug regimen assessment was performed, including review of allergies, interactions, disease-state management, dosing and immunization history. Medications were reviewed with the patient, including name, instructions, indication, goals of therapy, potential side effects, importance of adherence, and safe use.  Drug interaction(s): no major interactions identified.  2. Inhaler Optimization  Patient currently using Trelegy Ellipta. Inspiratory flow measured using the In-check DIAL G16 and was in range of 30-90 for use of medium-low resistance DPI device (Ellipta, Diskus). Optimal range is greater than 60 L/min.  Patient scored 65 indicating she is a good candidate for the use of Trelegy inhaler.  Patient was counseled on the purpose, proper use, and adverse effects of Trelegy inhaler.  Instructed patient to rinse mouth with water after using in order to prevent fungal infection.  Patient verbalized understanding.  Reviewed appropriate use of maintenance vs rescue inhalers.  Stressed importance of using maintenance inhaler daily and rescue inhaler only as needed.  Patient verbalized  understanding.  Demonstrated proper inhaler technique using Trelegy demo inhaler.  Patient able to demonstrate proper inhaler technique using teach back method.   Patient denies any issues affording her medication as she qualifys for Medicare Extra Help low income subsidy.  3. Immunizations  Patient is up-to-date with influenza and pneumonia vaccines.  She is eligible for Shingrix, tetanus, and COVID-19 vaccine.  Encourage patient to follow-up with primary care provider to ensure she is up-to-date with vaccines.  Patient verbalized understanding.  PLAN  Continue Trelegy  Recommend Shingrix, tetanus, and Covid-19 vaccine  All questions encouraged and answered.  Instructed patient to reach out with any further questions or concerns.  Thank you for allowing pharmacy to participate in this patient's care.  This appointment required 45 minutes of patient care (this includes precharting, chart review, review of results, face-to-face care, etc.).   Mariella Saa, PharmD, [place], [facility] Pharmacist [phone]  05/25/2019 2:19 PM

## 2019-05-25 NOTE — Patient Instructions (Signed)
Thank you for meeting with the pharmacy team today!   We updated your medication list to reflect the prescriptions with what you are actually taking.    We checked your inspiratory flow which is in the appropriate range for Trelegy inhaler.    You are eligible for Shingrix, tetanus, and Covid-19 vaccine.  Check with primary doctor to see if you are up to date.  Please call with any questions or concerns.

## 2019-05-31 ENCOUNTER — Encounter (HOSPITAL_COMMUNITY)
Admission: RE | Admit: 2019-05-31 | Discharge: 2019-05-31 | Disposition: A | Discharge status: Home / Self Care | Payer: Medicare HMO | Source: Ambulatory Visit | Attending: Nephrology | Admitting: Nephrology

## 2019-05-31 ENCOUNTER — Other Ambulatory Visit: Payer: Self-pay

## 2019-05-31 VITALS — BP 167/86 | HR 80 | Temp 97.2°F | Resp 20

## 2019-05-31 DIAGNOSIS — N184 Chronic kidney disease, stage 4 (severe): Secondary | ICD-10-CM | POA: Diagnosis not present

## 2019-05-31 DIAGNOSIS — D631 Anemia in chronic kidney disease: Secondary | ICD-10-CM | POA: Diagnosis not present

## 2019-05-31 DIAGNOSIS — N183 Chronic kidney disease, stage 3 unspecified: Secondary | ICD-10-CM

## 2019-05-31 LAB — IRON AND TIBC
Iron: 103 ug/dL (ref 28–170)
Saturation Ratios: 53 % — ABNORMAL HIGH (ref 10.4–31.8)
TIBC: 195 ug/dL — ABNORMAL LOW (ref 250–450)
UIBC: 92 ug/dL

## 2019-05-31 LAB — RENAL FUNCTION PANEL
Albumin: 3.3 g/dL — ABNORMAL LOW (ref 3.5–5.0)
Anion gap: 9 (ref 5–15)
BUN: 33 mg/dL — ABNORMAL HIGH (ref 8–23)
CO2: 23 mmol/L (ref 22–32)
Calcium: 8.5 mg/dL — ABNORMAL LOW (ref 8.9–10.3)
Chloride: 113 mmol/L — ABNORMAL HIGH (ref 98–111)
Creatinine, Ser: 3.02 mg/dL — ABNORMAL HIGH (ref 0.44–1.00)
GFR calc Af Amer: 17 mL/min — ABNORMAL LOW (ref >60)
GFR calc non Af Amer: 15 mL/min — ABNORMAL LOW (ref >60)
Glucose, Bld: 190 mg/dL — ABNORMAL HIGH (ref 70–99)
Phosphorus: 3.9 mg/dL (ref 2.5–4.6)
Potassium: 4.9 mmol/L (ref 3.5–5.1)
Sodium: 145 mmol/L (ref 135–145)

## 2019-05-31 LAB — POCT HEMOGLOBIN-HEMACUE: Hemoglobin: 9.1 g/dL — ABNORMAL LOW (ref 12.0–15.0)

## 2019-05-31 LAB — FERRITIN: Ferritin: 379 ng/mL — ABNORMAL HIGH (ref 11–307)

## 2019-05-31 MED ORDER — EPOETIN ALFA 10000 UNIT/ML IJ SOLN
INTRAMUSCULAR | Status: AC
  Administered 2019-05-31: 10000 [IU] via SUBCUTANEOUS
  Filled 2019-05-31: qty 1

## 2019-05-31 MED ORDER — EPOETIN ALFA 40000 UNIT/ML IJ SOLN: 30000.0000 [IU] | INTRAMUSCULAR | Status: DC

## 2019-05-31 MED ORDER — EPOETIN ALFA 20000 UNIT/ML IJ SOLN
INTRAMUSCULAR | Status: AC
  Administered 2019-05-31: 20000 [IU] via SUBCUTANEOUS
  Filled 2019-05-31: qty 1

## 2019-06-01 LAB — PTH, INTACT AND CALCIUM
Calcium, Total (PTH): 8.4 mg/dL — ABNORMAL LOW (ref 8.7–10.3)
PTH: 169 pg/mL — ABNORMAL HIGH (ref 15–65)

## 2019-06-02 ENCOUNTER — Other Ambulatory Visit: Payer: Self-pay | Admitting: Gastroenterology

## 2019-06-14 ENCOUNTER — Other Ambulatory Visit: Payer: Self-pay

## 2019-06-14 ENCOUNTER — Ambulatory Visit (HOSPITAL_COMMUNITY)
Admission: RE | Admit: 2019-06-14 | Discharge: 2019-06-14 | Disposition: A | Discharge status: Home / Self Care | Payer: Medicare HMO | Source: Ambulatory Visit | Attending: Nephrology | Admitting: Nephrology

## 2019-06-14 DIAGNOSIS — N183 Chronic kidney disease, stage 3 unspecified: Secondary | ICD-10-CM | POA: Diagnosis not present

## 2019-06-14 LAB — POCT HEMOGLOBIN-HEMACUE: Hemoglobin: 8.8 g/dL — ABNORMAL LOW (ref 12.0–15.0)

## 2019-06-14 MED ORDER — EPOETIN ALFA 20000 UNIT/ML IJ SOLN
INTRAMUSCULAR | Status: AC
  Administered 2019-06-14: 20000 [IU]
  Filled 2019-06-14: qty 1

## 2019-06-14 MED ORDER — EPOETIN ALFA 10000 UNIT/ML IJ SOLN
INTRAMUSCULAR | Status: AC
  Administered 2019-06-14: 10000 [IU]
  Filled 2019-06-14: qty 1

## 2019-06-14 MED ORDER — EPOETIN ALFA 10000 UNIT/ML IJ SOLN: 30000.0000 [IU] | INTRAMUSCULAR | Status: DC

## 2019-06-28 ENCOUNTER — Encounter (HOSPITAL_COMMUNITY)
Admission: RE | Admit: 2019-06-28 | Discharge: 2019-06-28 | Disposition: A | Discharge status: Home / Self Care | Payer: Medicare HMO | Source: Ambulatory Visit | Attending: Nephrology | Admitting: Nephrology

## 2019-06-28 VITALS — BP 176/83 | HR 83 | Temp 97.1°F | Resp 20

## 2019-06-28 DIAGNOSIS — N183 Chronic kidney disease, stage 3 unspecified: Secondary | ICD-10-CM

## 2019-06-28 DIAGNOSIS — D631 Anemia in chronic kidney disease: Secondary | ICD-10-CM | POA: Diagnosis not present

## 2019-06-28 DIAGNOSIS — N184 Chronic kidney disease, stage 4 (severe): Secondary | ICD-10-CM | POA: Insufficient documentation

## 2019-06-28 LAB — RENAL FUNCTION PANEL
Albumin: 3.3 g/dL — ABNORMAL LOW (ref 3.5–5.0)
Anion gap: 8 (ref 5–15)
BUN: 27 mg/dL — ABNORMAL HIGH (ref 8–23)
CO2: 25 mmol/L (ref 22–32)
Calcium: 8.6 mg/dL — ABNORMAL LOW (ref 8.9–10.3)
Chloride: 111 mmol/L (ref 98–111)
Creatinine, Ser: 2.51 mg/dL — ABNORMAL HIGH (ref 0.44–1.00)
GFR calc Af Amer: 21 mL/min — ABNORMAL LOW (ref >60)
GFR calc non Af Amer: 18 mL/min — ABNORMAL LOW (ref >60)
Glucose, Bld: 134 mg/dL — ABNORMAL HIGH (ref 70–99)
Phosphorus: 3.1 mg/dL (ref 2.5–4.6)
Potassium: 4.3 mmol/L (ref 3.5–5.1)
Sodium: 144 mmol/L (ref 135–145)

## 2019-06-28 LAB — IRON AND TIBC
Iron: 130 ug/dL (ref 28–170)
Saturation Ratios: 66 % — ABNORMAL HIGH (ref 10.4–31.8)
TIBC: 197 ug/dL — ABNORMAL LOW (ref 250–450)
UIBC: 67 ug/dL

## 2019-06-28 LAB — POCT HEMOGLOBIN-HEMACUE: Hemoglobin: 9 g/dL — ABNORMAL LOW (ref 12.0–15.0)

## 2019-06-28 LAB — FERRITIN: Ferritin: 437 ng/mL — ABNORMAL HIGH (ref 11–307)

## 2019-06-28 MED ORDER — EPOETIN ALFA 20000 UNIT/ML IJ SOLN
INTRAMUSCULAR | Status: AC
  Administered 2019-06-28: 20000 [IU] via SUBCUTANEOUS
  Filled 2019-06-28: qty 1

## 2019-06-28 MED ORDER — EPOETIN ALFA 40000 UNIT/ML IJ SOLN: 30000.0000 [IU] | INTRAMUSCULAR | Status: DC

## 2019-06-28 MED ORDER — EPOETIN ALFA 10000 UNIT/ML IJ SOLN
INTRAMUSCULAR | Status: AC
  Administered 2019-06-28: 10000 [IU] via SUBCUTANEOUS
  Filled 2019-06-28: qty 1

## 2019-06-29 LAB — PTH, INTACT AND CALCIUM
Calcium, Total (PTH): 8.3 mg/dL — ABNORMAL LOW (ref 8.7–10.3)
PTH: 74 pg/mL — ABNORMAL HIGH (ref 15–65)

## 2019-07-12 ENCOUNTER — Ambulatory Visit (HOSPITAL_COMMUNITY)
Admission: RE | Admit: 2019-07-12 | Discharge: 2019-07-12 | Disposition: A | Discharge status: Home / Self Care | Payer: Medicare HMO | Source: Ambulatory Visit | Attending: Nephrology | Admitting: Nephrology

## 2019-07-12 ENCOUNTER — Other Ambulatory Visit: Payer: Self-pay

## 2019-07-12 VITALS — BP 160/79 | HR 82 | Temp 97.5°F | Resp 20

## 2019-07-12 DIAGNOSIS — N183 Chronic kidney disease, stage 3 unspecified: Secondary | ICD-10-CM | POA: Insufficient documentation

## 2019-07-12 LAB — POCT HEMOGLOBIN-HEMACUE: Hemoglobin: 9.4 g/dL — ABNORMAL LOW (ref 12.0–15.0)

## 2019-07-12 MED ORDER — EPOETIN ALFA 20000 UNIT/ML IJ SOLN
INTRAMUSCULAR | Status: AC
  Administered 2019-07-12: 20000 [IU] via SUBCUTANEOUS
  Filled 2019-07-12: qty 1

## 2019-07-12 MED ORDER — EPOETIN ALFA 10000 UNIT/ML IJ SOLN
INTRAMUSCULAR | Status: AC
  Administered 2019-07-12: 10000 [IU] via SUBCUTANEOUS
  Filled 2019-07-12: qty 1

## 2019-07-12 MED ORDER — EPOETIN ALFA 40000 UNIT/ML IJ SOLN: 30000.0000 [IU] | INTRAMUSCULAR | Status: DC

## 2019-07-27 ENCOUNTER — Other Ambulatory Visit: Payer: Self-pay

## 2019-07-27 ENCOUNTER — Encounter (HOSPITAL_COMMUNITY)
Admission: RE | Admit: 2019-07-27 | Discharge: 2019-07-27 | Disposition: A | Discharge status: Home / Self Care | Payer: Medicare HMO | Source: Ambulatory Visit | Attending: Nephrology | Admitting: Nephrology

## 2019-07-27 VITALS — BP 156/75 | HR 76 | Temp 97.6°F | Resp 20

## 2019-07-27 DIAGNOSIS — N183 Chronic kidney disease, stage 3 unspecified: Secondary | ICD-10-CM | POA: Insufficient documentation

## 2019-07-27 LAB — IRON AND TIBC
Iron: 153 ug/dL (ref 28–170)
Saturation Ratios: 76 % — ABNORMAL HIGH (ref 10.4–31.8)
TIBC: 202 ug/dL — ABNORMAL LOW (ref 250–450)
UIBC: 49 ug/dL

## 2019-07-27 LAB — RENAL FUNCTION PANEL
Albumin: 3.3 g/dL — ABNORMAL LOW (ref 3.5–5.0)
Anion gap: 10 (ref 5–15)
BUN: 47 mg/dL — ABNORMAL HIGH (ref 8–23)
CO2: 23 mmol/L (ref 22–32)
Calcium: 8.6 mg/dL — ABNORMAL LOW (ref 8.9–10.3)
Chloride: 110 mmol/L (ref 98–111)
Creatinine, Ser: 2.89 mg/dL — ABNORMAL HIGH (ref 0.44–1.00)
GFR calc Af Amer: 18 mL/min — ABNORMAL LOW (ref >60)
GFR calc non Af Amer: 15 mL/min — ABNORMAL LOW (ref >60)
Glucose, Bld: 158 mg/dL — ABNORMAL HIGH (ref 70–99)
Phosphorus: 3.1 mg/dL (ref 2.5–4.6)
Potassium: 4.4 mmol/L (ref 3.5–5.1)
Sodium: 143 mmol/L (ref 135–145)

## 2019-07-27 LAB — FERRITIN: Ferritin: 408 ng/mL — ABNORMAL HIGH (ref 11–307)

## 2019-07-27 MED ORDER — EPOETIN ALFA 40000 UNIT/ML IJ SOLN: 30000.0000 [IU] | INTRAMUSCULAR | Status: DC

## 2019-07-27 MED ORDER — EPOETIN ALFA 10000 UNIT/ML IJ SOLN
INTRAMUSCULAR | Status: AC
  Administered 2019-07-27: 10000 [IU]
  Filled 2019-07-27: qty 1

## 2019-07-27 MED ORDER — EPOETIN ALFA 20000 UNIT/ML IJ SOLN
INTRAMUSCULAR | Status: AC
  Administered 2019-07-27: 20000 [IU]
  Filled 2019-07-27: qty 1

## 2019-07-28 LAB — PTH, INTACT AND CALCIUM
Calcium, Total (PTH): 8.7 mg/dL (ref 8.7–10.3)
PTH: 123 pg/mL — ABNORMAL HIGH (ref 15–65)

## 2019-07-28 LAB — POCT HEMOGLOBIN-HEMACUE: Hemoglobin: 9.4 g/dL — ABNORMAL LOW (ref 12.0–15.0)

## 2019-08-10 ENCOUNTER — Encounter (HOSPITAL_COMMUNITY): Payer: Medicare HMO

## 2019-08-20 ENCOUNTER — Other Ambulatory Visit: Payer: Self-pay

## 2019-08-20 ENCOUNTER — Ambulatory Visit (HOSPITAL_COMMUNITY)
Admission: RE | Admit: 2019-08-20 | Discharge: 2019-08-20 | Disposition: A | Discharge status: Home / Self Care | Payer: Medicare HMO | Source: Ambulatory Visit | Attending: Nephrology | Admitting: Nephrology

## 2019-08-20 VITALS — BP 150/61 | HR 76

## 2019-08-20 DIAGNOSIS — N183 Chronic kidney disease, stage 3 unspecified: Secondary | ICD-10-CM | POA: Insufficient documentation

## 2019-08-20 LAB — POCT HEMOGLOBIN-HEMACUE: Hemoglobin: 8.3 g/dL — ABNORMAL LOW (ref 12.0–15.0)

## 2019-08-20 MED ORDER — EPOETIN ALFA 10000 UNIT/ML IJ SOLN
INTRAMUSCULAR | Status: AC
  Administered 2019-08-20: 10000 [IU]
  Filled 2019-08-20: qty 1

## 2019-08-20 MED ORDER — EPOETIN ALFA 40000 UNIT/ML IJ SOLN: 30000.0000 [IU] | INTRAMUSCULAR | Status: DC

## 2019-08-20 MED ORDER — EPOETIN ALFA 20000 UNIT/ML IJ SOLN
INTRAMUSCULAR | Status: AC
  Administered 2019-08-20: 20000 [IU]
  Filled 2019-08-20: qty 1

## 2019-08-24 ENCOUNTER — Encounter (HOSPITAL_COMMUNITY): Payer: Medicare HMO

## 2019-09-01 ENCOUNTER — Other Ambulatory Visit: Payer: Self-pay | Admitting: Gastroenterology

## 2019-09-03 ENCOUNTER — Other Ambulatory Visit: Payer: Self-pay

## 2019-09-03 ENCOUNTER — Encounter (HOSPITAL_COMMUNITY)
Admission: RE | Admit: 2019-09-03 | Discharge: 2019-09-03 | Disposition: A | Discharge status: Home / Self Care | Payer: Medicare HMO | Source: Ambulatory Visit | Attending: Nephrology | Admitting: Nephrology

## 2019-09-03 VITALS — BP 161/73 | HR 76

## 2019-09-03 DIAGNOSIS — N183 Chronic kidney disease, stage 3 unspecified: Secondary | ICD-10-CM | POA: Insufficient documentation

## 2019-09-03 LAB — RENAL FUNCTION PANEL
Albumin: 3.4 g/dL — ABNORMAL LOW (ref 3.5–5.0)
Anion gap: 11 (ref 5–15)
BUN: 33 mg/dL — ABNORMAL HIGH (ref 8–23)
CO2: 24 mmol/L (ref 22–32)
Calcium: 8.7 mg/dL — ABNORMAL LOW (ref 8.9–10.3)
Chloride: 108 mmol/L (ref 98–111)
Creatinine, Ser: 3.37 mg/dL — ABNORMAL HIGH (ref 0.44–1.00)
GFR calc Af Amer: 15 mL/min — ABNORMAL LOW (ref >60)
GFR calc non Af Amer: 13 mL/min — ABNORMAL LOW (ref >60)
Glucose, Bld: 186 mg/dL — ABNORMAL HIGH (ref 70–99)
Phosphorus: 3.7 mg/dL (ref 2.5–4.6)
Potassium: 4.4 mmol/L (ref 3.5–5.1)
Sodium: 143 mmol/L (ref 135–145)

## 2019-09-03 LAB — FERRITIN: Ferritin: 387 ng/mL — ABNORMAL HIGH (ref 11–307)

## 2019-09-03 LAB — IRON AND TIBC
Iron: 62 ug/dL (ref 28–170)
Saturation Ratios: 30 % (ref 10.4–31.8)
TIBC: 209 ug/dL — ABNORMAL LOW (ref 250–450)
UIBC: 147 ug/dL

## 2019-09-03 LAB — POCT HEMOGLOBIN-HEMACUE: Hemoglobin: 9.1 g/dL — ABNORMAL LOW (ref 12.0–15.0)

## 2019-09-03 MED ORDER — EPOETIN ALFA 20000 UNIT/ML IJ SOLN
INTRAMUSCULAR | Status: AC
  Administered 2019-09-03: 20000 [IU]
  Filled 2019-09-03: qty 1

## 2019-09-03 MED ORDER — EPOETIN ALFA 10000 UNIT/ML IJ SOLN: 30000.0000 [IU] | INTRAMUSCULAR | Status: DC

## 2019-09-03 MED ORDER — EPOETIN ALFA 10000 UNIT/ML IJ SOLN
INTRAMUSCULAR | Status: AC
  Administered 2019-09-03: 10000 [IU]
  Filled 2019-09-03: qty 1

## 2019-09-04 LAB — PTH, INTACT AND CALCIUM
Calcium, Total (PTH): 8.5 mg/dL — ABNORMAL LOW (ref 8.7–10.3)
PTH: 106 pg/mL — ABNORMAL HIGH (ref 15–65)

## 2019-09-17 ENCOUNTER — Other Ambulatory Visit: Payer: Self-pay

## 2019-09-17 ENCOUNTER — Encounter (HOSPITAL_COMMUNITY)
Admission: RE | Admit: 2019-09-17 | Discharge: 2019-09-17 | Disposition: A | Discharge status: Home / Self Care | Payer: Medicare HMO | Source: Ambulatory Visit | Attending: Nephrology | Admitting: Nephrology

## 2019-09-17 VITALS — BP 167/83 | HR 76 | Temp 97.1°F | Resp 20

## 2019-09-17 DIAGNOSIS — N183 Chronic kidney disease, stage 3 unspecified: Secondary | ICD-10-CM | POA: Diagnosis not present

## 2019-09-17 LAB — POCT HEMOGLOBIN-HEMACUE: Hemoglobin: 9.1 g/dL — ABNORMAL LOW (ref 12.0–15.0)

## 2019-09-17 MED ORDER — EPOETIN ALFA 10000 UNIT/ML IJ SOLN
INTRAMUSCULAR | Status: AC
  Administered 2019-09-17: 10000 [IU]
  Filled 2019-09-17: qty 1

## 2019-09-17 MED ORDER — EPOETIN ALFA 40000 UNIT/ML IJ SOLN: 30000.0000 [IU] | INTRAMUSCULAR | Status: DC

## 2019-09-17 MED ORDER — EPOETIN ALFA 20000 UNIT/ML IJ SOLN
INTRAMUSCULAR | Status: AC
  Administered 2019-09-17: 20000 [IU]
  Filled 2019-09-17: qty 1

## 2019-10-01 ENCOUNTER — Encounter (HOSPITAL_COMMUNITY)
Admission: RE | Admit: 2019-10-01 | Discharge: 2019-10-01 | Disposition: A | Discharge status: Home / Self Care | Payer: Medicare HMO | Source: Ambulatory Visit | Attending: Nephrology | Admitting: Nephrology

## 2019-10-01 ENCOUNTER — Other Ambulatory Visit: Payer: Self-pay

## 2019-10-01 VITALS — BP 145/66 | HR 76 | Temp 97.6°F | Resp 20

## 2019-10-01 DIAGNOSIS — N183 Chronic kidney disease, stage 3 unspecified: Secondary | ICD-10-CM | POA: Insufficient documentation

## 2019-10-01 DIAGNOSIS — D631 Anemia in chronic kidney disease: Secondary | ICD-10-CM | POA: Insufficient documentation

## 2019-10-01 LAB — RENAL FUNCTION PANEL
Albumin: 3.3 g/dL — ABNORMAL LOW (ref 3.5–5.0)
Anion gap: 10 (ref 5–15)
BUN: 45 mg/dL — ABNORMAL HIGH (ref 8–23)
CO2: 23 mmol/L (ref 22–32)
Calcium: 8 mg/dL — ABNORMAL LOW (ref 8.9–10.3)
Chloride: 109 mmol/L (ref 98–111)
Creatinine, Ser: 3.64 mg/dL — ABNORMAL HIGH (ref 0.44–1.00)
GFR calc Af Amer: 13 mL/min — ABNORMAL LOW (ref >60)
GFR calc non Af Amer: 12 mL/min — ABNORMAL LOW (ref >60)
Glucose, Bld: 161 mg/dL — ABNORMAL HIGH (ref 70–99)
Phosphorus: 4.2 mg/dL (ref 2.5–4.6)
Potassium: 4.2 mmol/L (ref 3.5–5.1)
Sodium: 142 mmol/L (ref 135–145)

## 2019-10-01 LAB — IRON AND TIBC
Iron: 71 ug/dL (ref 28–170)
Saturation Ratios: 36 % — ABNORMAL HIGH (ref 10.4–31.8)
TIBC: 195 ug/dL — ABNORMAL LOW (ref 250–450)
UIBC: 124 ug/dL

## 2019-10-01 LAB — FERRITIN: Ferritin: 334 ng/mL — ABNORMAL HIGH (ref 11–307)

## 2019-10-01 LAB — POCT HEMOGLOBIN-HEMACUE: Hemoglobin: 9 g/dL — ABNORMAL LOW (ref 12.0–15.0)

## 2019-10-01 MED ORDER — EPOETIN ALFA 20000 UNIT/ML IJ SOLN
INTRAMUSCULAR | Status: AC
  Administered 2019-10-01: 20000 [IU] via SUBCUTANEOUS
  Filled 2019-10-01: qty 1

## 2019-10-01 MED ORDER — EPOETIN ALFA 10000 UNIT/ML IJ SOLN: 30000.0000 [IU] | INTRAMUSCULAR | Status: DC

## 2019-10-01 MED ORDER — EPOETIN ALFA 10000 UNIT/ML IJ SOLN
INTRAMUSCULAR | Status: AC
  Administered 2019-10-01: 10000 [IU] via SUBCUTANEOUS
  Filled 2019-10-01: qty 1

## 2019-10-05 LAB — PTH, INTACT AND CALCIUM
Calcium, Total (PTH): 7.9 mg/dL — ABNORMAL LOW (ref 8.7–10.3)
PTH: 67 pg/mL — ABNORMAL HIGH (ref 15–65)

## 2019-10-12 ENCOUNTER — Other Ambulatory Visit: Payer: Self-pay | Admitting: General Practice

## 2019-10-12 DIAGNOSIS — Z1231 Encounter for screening mammogram for malignant neoplasm of breast: Secondary | ICD-10-CM

## 2019-10-15 ENCOUNTER — Encounter (HOSPITAL_COMMUNITY): Payer: Medicare HMO

## 2019-10-29 ENCOUNTER — Other Ambulatory Visit: Payer: Self-pay

## 2019-10-29 ENCOUNTER — Encounter (HOSPITAL_COMMUNITY)
Admission: RE | Admit: 2019-10-29 | Discharge: 2019-10-29 | Disposition: A | Discharge status: Home / Self Care | Payer: Medicare HMO | Source: Ambulatory Visit | Attending: Nephrology | Admitting: Nephrology

## 2019-10-29 VITALS — BP 143/56 | HR 68 | Temp 97.0°F | Resp 20

## 2019-10-29 DIAGNOSIS — N183 Chronic kidney disease, stage 3 unspecified: Secondary | ICD-10-CM | POA: Insufficient documentation

## 2019-10-29 DIAGNOSIS — D631 Anemia in chronic kidney disease: Secondary | ICD-10-CM | POA: Diagnosis not present

## 2019-10-29 LAB — IRON AND TIBC
Iron: 71 ug/dL (ref 28–170)
Saturation Ratios: 35 % — ABNORMAL HIGH (ref 10.4–31.8)
TIBC: 200 ug/dL — ABNORMAL LOW (ref 250–450)
UIBC: 129 ug/dL

## 2019-10-29 LAB — RENAL FUNCTION PANEL
Albumin: 3.2 g/dL — ABNORMAL LOW (ref 3.5–5.0)
Anion gap: 11 (ref 5–15)
BUN: 46 mg/dL — ABNORMAL HIGH (ref 8–23)
CO2: 25 mmol/L (ref 22–32)
Calcium: 8.4 mg/dL — ABNORMAL LOW (ref 8.9–10.3)
Chloride: 109 mmol/L (ref 98–111)
Creatinine, Ser: 3.53 mg/dL — ABNORMAL HIGH (ref 0.44–1.00)
GFR, Estimated: 12 mL/min — ABNORMAL LOW (ref >60)
Glucose, Bld: 169 mg/dL — ABNORMAL HIGH (ref 70–99)
Phosphorus: 4.1 mg/dL (ref 2.5–4.6)
Potassium: 4.6 mmol/L (ref 3.5–5.1)
Sodium: 145 mmol/L (ref 135–145)

## 2019-10-29 LAB — FERRITIN: Ferritin: 378 ng/mL — ABNORMAL HIGH (ref 11–307)

## 2019-10-29 LAB — POCT HEMOGLOBIN-HEMACUE: Hemoglobin: 8.7 g/dL — ABNORMAL LOW (ref 12.0–15.0)

## 2019-10-29 MED ORDER — EPOETIN ALFA 40000 UNIT/ML IJ SOLN
INTRAMUSCULAR | Status: AC
  Administered 2019-10-29: 30000 [IU] via SUBCUTANEOUS
  Filled 2019-10-29: qty 1

## 2019-10-29 MED ORDER — EPOETIN ALFA 40000 UNIT/ML IJ SOLN: 30000.0000 [IU] | INTRAMUSCULAR | Status: DC

## 2019-10-29 MED ORDER — EPOETIN ALFA 10000 UNIT/ML IJ SOLN
INTRAMUSCULAR | Status: AC
  Filled 2019-10-29: qty 3

## 2019-10-30 LAB — PTH, INTACT AND CALCIUM
Calcium, Total (PTH): 8.2 mg/dL — ABNORMAL LOW (ref 8.7–10.3)
PTH: 162 pg/mL — ABNORMAL HIGH (ref 15–65)

## 2019-11-08 ENCOUNTER — Ambulatory Visit: Payer: Medicare HMO | Admitting: Cardiology

## 2019-11-10 ENCOUNTER — Other Ambulatory Visit: Payer: Self-pay | Admitting: Pulmonary Disease

## 2019-11-10 MED ORDER — AZELASTINE HCL 0.1 % NA SOLN
1.0000 | Freq: Two times a day (BID) | NASAL | 5 refills | Status: DC | PRN
Start: 2019-11-10 — End: 2020-04-08

## 2019-11-12 ENCOUNTER — Encounter (HOSPITAL_COMMUNITY)
Admission: RE | Admit: 2019-11-12 | Discharge: 2019-11-12 | Disposition: A | Discharge status: Home / Self Care | Payer: Medicare HMO | Source: Ambulatory Visit | Attending: Nephrology | Admitting: Nephrology

## 2019-11-12 ENCOUNTER — Other Ambulatory Visit: Payer: Self-pay

## 2019-11-12 VITALS — BP 148/60 | HR 79 | Temp 97.3°F | Resp 20

## 2019-11-12 DIAGNOSIS — N183 Chronic kidney disease, stage 3 unspecified: Secondary | ICD-10-CM | POA: Diagnosis not present

## 2019-11-12 LAB — POCT HEMOGLOBIN-HEMACUE: Hemoglobin: 9.1 g/dL — ABNORMAL LOW (ref 12.0–15.0)

## 2019-11-12 MED ORDER — EPOETIN ALFA 20000 UNIT/ML IJ SOLN
INTRAMUSCULAR | Status: AC
  Administered 2019-11-12: 20000 [IU] via SUBCUTANEOUS
  Filled 2019-11-12: qty 1

## 2019-11-12 MED ORDER — EPOETIN ALFA 40000 UNIT/ML IJ SOLN: 30000.0000 [IU] | INTRAMUSCULAR | Status: DC

## 2019-11-12 MED ORDER — EPOETIN ALFA 10000 UNIT/ML IJ SOLN
INTRAMUSCULAR | Status: AC
  Administered 2019-11-12: 10000 [IU] via SUBCUTANEOUS
  Filled 2019-11-12: qty 1

## 2019-11-15 ENCOUNTER — Telehealth: Payer: Self-pay | Admitting: Internal Medicine

## 2019-11-15 DIAGNOSIS — J449 Chronic obstructive pulmonary disease, unspecified: Secondary | ICD-10-CM

## 2019-11-15 MED ORDER — ALBUTEROL SULFATE HFA 108 (90 BASE) MCG/ACT IN AERS: 2.0000 | INHALATION_SPRAY | Freq: Four times a day (QID) | RESPIRATORY_TRACT | 6 refills | Status: DC | PRN

## 2019-11-15 NOTE — Telephone Encounter (Signed)
Called to schedule patient for a f/u appointment.  She was last seen in March and was to f/u in 3 months.  Left a message with a gentleman to have her call the office back.  She just needs to schedule a f/u visit.

## 2019-11-18 ENCOUNTER — Encounter: Payer: Self-pay | Admitting: Primary Care

## 2019-11-18 ENCOUNTER — Ambulatory Visit (INDEPENDENT_AMBULATORY_CARE_PROVIDER_SITE_OTHER): Payer: Medicare HMO | Admitting: Primary Care

## 2019-11-18 ENCOUNTER — Other Ambulatory Visit: Payer: Self-pay

## 2019-11-18 VITALS — BP 124/68 | HR 78 | Temp 98.0°F | Ht 65.5 in | Wt 264.0 lb

## 2019-11-18 DIAGNOSIS — E785 Hyperlipidemia, unspecified: Secondary | ICD-10-CM | POA: Insufficient documentation

## 2019-11-18 DIAGNOSIS — J4489 Other specified chronic obstructive pulmonary disease: Secondary | ICD-10-CM

## 2019-11-18 DIAGNOSIS — J449 Chronic obstructive pulmonary disease, unspecified: Secondary | ICD-10-CM

## 2019-11-18 DIAGNOSIS — G4733 Obstructive sleep apnea (adult) (pediatric): Secondary | ICD-10-CM

## 2019-11-18 DIAGNOSIS — I5033 Acute on chronic diastolic (congestive) heart failure: Secondary | ICD-10-CM | POA: Insufficient documentation

## 2019-11-18 NOTE — Progress Notes (Signed)
@Patient  ID: Joann Gutierrez, female    DOB: May 04, 1945, 74 y.o.   MRN: [id number]  Chief Complaint  Patient presents with  . Follow-up    CPAP and oxygen doing good    Referring provider: Rocco Serene, MD  HPI: 74 year old female, former smoker quit 1982 (5-pack-year history). Past medical history significant for chronic obstructive asthma and obstructive sleep apnea. Patient of Dr. Remi Haggard, last seen by pulmonary NP in March 2021. Recommended 3-6 month follow-up.   11/18/2019 Patient presents today for routine follow-up. During last visit she was instructed to resume CPAP and take Trelegy Ellipta inhaler daily. She has been wearing her CPAP with 2L oxygen at night. She feels tired every day. States that her breathing has been ok. She is compliant with Trelegy, uses Ventolin rescue inhaler 2-3 times a week on average. She has a female caretaker who cooks and cleans for her.   Airview download 10/06/19-11/04/19 Usage days 26/30 (87%); 9 days (30%) greater than 4 hours Average usage 3 hours 27 minutes Pressure 9 cm H2O Air leaks 107.8 L/min AHI 16.2  Allergies  Allergen Reactions  . Morphine And Related Other (See Comments)    Family request not to be given, reports pt does not wake up when given   . Promethazine Hcl Other (See Comments)    REACTION: lethargy    Immunization History  Administered Date(s) Administered  . Fluad Quad(high Dose 65+) 10/09/2019  . Influenza Split 10/16/2010, 11/10/2012  . Influenza Whole 01/02/2009, 11/02/2009, 10/31/2011  . Influenza, High Dose Seasonal PF 11/04/2017  . Influenza,inj,Quad PF,6+ Mos 09/24/2013, 10/16/2015  . Influenza-Unspecified 11/21/2016  . Pneumococcal Conjugate-13 10/16/2015  . Pneumococcal Polysaccharide-23 10/22/2002, 10/21/2009, 09/12/2013, 09/07/2017  . Td 08/22/2002    Past Medical History:  Diagnosis Date  . Adenomatous colon polyp   . Allergy   . Anemia   . Asthma       . CAD (coronary artery disease)     Mild very minimal coronary disease with 20% obtuse marginal stenosis  . Carpal tunnel syndrome on left   . Cataract   . CHF (congestive heart failure) ([place])   . Chronic kidney disease (CKD), stage III (moderate) (HCC)    now stage 4  . COPD (chronic obstructive pulmonary disease) ([place])   . Depression   . 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 Diverticulosis   . Dyspnea   . 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 Esophageal dysmotility   . Esophageal stricture   . Gastritis   . Gastroparesis 08/21/2007  . GERD (gastroesophageal reflux disease)   . Hearing loss of both ears   . Hernia, hiatal   . Hyperlipidemia   . Hypertension   . Morbid obesity ([place])   . OSTEOARTHRITIS 08/09/2006  . Osteoporosis   . Oxygen deficiency    prn   . 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 RENAL INSUFFICIENCY 02/16/2009  . 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 Seizures ([place])    pt thinks it has been several monthes since she had a seisure  . Shingles   . Sickle cell trait ([place])   . Sleep apnea    uses cpap  . Stroke [facility])     Tobacco History: Social History   Tobacco Use  Smoking Status Former Smoker  . Packs/day: 0.50  . Years: 10.00  . Pack years: 5.00  . Quit date: 04/18/1980  . Years since quitting: 39.6  Smokeless Tobacco Never Used  Counseling given: Not Answered   Outpatient Medications Prior to Visit  Medication Sig Dispense Refill  . albuterol (VENTOLIN HFA) 108 (90 Base) MCG/ACT inhaler Inhale 2 puffs into the lungs every 6 (six) hours as needed for wheezing or shortness of breath. 18 g 6  . amLODipine (NORVASC) 10 MG tablet Take 10 mg by mouth every evening.    Marland Kitchen aspirin EC 81 MG tablet Take 81 mg by mouth daily.    Marland Kitchen azelastine (ASTELIN) 0.1 % nasal spray Place 1 spray into both nostrils 2 (two) times daily as needed (allergies). Use in each nostril as directed 30 mL 5  . calcium carbonate (OSCAL) 1500 (600 Ca) MG TABS tablet Take 1 tablet by mouth  daily.    . carvedilol (COREG) 25 MG tablet Take 25 mg by mouth 2 (two) times daily.    Marland Kitchen donepezil (ARICEPT) 10 MG tablet Take 10 mg by mouth at bedtime.    Marland Kitchen ezetimibe (ZETIA) 10 MG tablet Take 10 mg by mouth daily.     . ferrous sulfate 325 (65 FE) MG tablet Take 325 mg by mouth daily.    . Fluticasone-Umeclidin-Vilant (TRELEGY ELLIPTA) 100-62.5-25 MCG/INH AEPB Take 1 puff by mouth daily. 60 each 5  . furosemide (LASIX) 80 MG tablet Take 160 mg by mouth 2 (two) times daily.     Marland Kitchen gabapentin (NEURONTIN) 100 MG capsule Take 1 capsule (100 mg total) by mouth 2 (two) times daily. (Patient taking differently: Take 100 mg by mouth at bedtime. )    . hydrALAZINE (APRESOLINE) 100 MG tablet Take 1 tablet by mouth 3 (three) times daily.    Marland Kitchen ketorolac (ACULAR) 0.4 % SOLN Place 1 drop into the left eye 2 (two) times a day.    Marland Kitchen LEVEMIR FLEXTOUCH 100 UNIT/ML Pen Inject 18 Units into the skin daily. (Patient taking differently: Inject 30 Units into the skin daily. )    . losartan (COZAAR) 100 MG tablet Take 100 mg by mouth daily.    . Misc Natural Products (NEURIVA PO) Take 1 capsule by mouth daily.    . montelukast (SINGULAIR) 10 MG tablet Take 1 tablet (10 mg total) by mouth daily. (Patient taking differently: Take 10 mg by mouth at bedtime. ) 30 tablet 3  . nitroGLYCERIN (NITROSTAT) 0.4 MG SL tablet PLACE ONE TABLET UNDER THE TONGUE EVERY FIVE MINUTES AS NEEDED FOR CHEST PAIN 25 tablet 6  . NOVOLOG FLEXPEN 100 UNIT/ML FlexPen INJECT 5 UNITS SUBCUTANEOUSLY 3 TIMES DAILY AS DIRECTED SLIDING SCALE    . omeprazole (PRILOSEC) 40 MG capsule Take 1 capsule (40 mg total) by mouth in the morning and at bedtime. Please schedule a yearly follow up: [phone]. Thank you 60 capsule 1  . OXYGEN Inhale 2 L into the lungs as needed. CPAP with oxygen at bedtime     . polyethylene glycol (MIRALAX) 17 g packet Take 17 g by mouth daily as needed. (Patient taking differently: Take 17 g by mouth daily as needed (constipation.).  ) 14 each 0  . promethazine (PHENERGAN) 12.5 MG tablet Take 1 tablet by mouth daily.    . raloxifene (EVISTA) 60 MG tablet Take 60 mg by mouth every evening.     Marland Kitchen spironolactone (ALDACTONE) 25 MG tablet Take 25 mg by mouth daily.    . TRUEPLUS PEN NEEDLES 32G X 4 MM MISC INJECT THREE TIMES A DAY AS DIRECTED  1  . Vitamin D, Ergocalciferol, (DRISDOL) 50000 UNITS CAPS capsule Take 50,000 Units by mouth  every Friday.      No facility-administered medications prior to visit.    Review of Systems  Review of Systems  Constitutional: Positive for fatigue.  Respiratory: Negative.    Physical Exam  BP 124/68 (BP Location: Left Arm, Cuff Size: Large)   Pulse 78   Temp 98 F (36.7 C) (Oral)   Ht 5' 5.5" (1.664 m)   Wt 264 lb (119.7 kg)   SpO2 97%   BMI 43.26 kg/m  Physical Exam Constitutional:      General: She is not in acute distress.    Appearance: Normal appearance. She is obese. She is not ill-appearing.  Cardiovascular:     Rate and Rhythm: Normal rate and regular rhythm.  Pulmonary:     Effort: Pulmonary effort is normal.     Breath sounds: Normal breath sounds. No wheezing.  Neurological:     Mental Status: She is alert.      Lab Results:  CBC    Component Value Date/Time   WBC 7.2 11/20/2017 1020   RBC 4.47 11/20/2017 1020   HGB 9.5 (L) 12/09/2019 1218   HCT 34.0 (L) 11/16/2018 0653   PLT 210.0 11/20/2017 1020   MCV 71.8 (L) 11/20/2017 1020   MCH 22.2 (L) 09/11/2017 0218   MCHC 31.2 11/20/2017 1020   RDW 19.9 (H) 11/20/2017 1020   LYMPHSABS 2.0 11/20/2017 1020   MONOABS 1.2 (H) 11/20/2017 1020   EOSABS 0.0 11/20/2017 1020   BASOSABS 0.1 11/20/2017 1020    BMET    Component Value Date/Time   NA 143 12/09/2019 1211   K 4.6 12/09/2019 1211   CL 109 12/09/2019 1211   CO2 22 12/09/2019 1211   GLUCOSE 159 (H) 12/09/2019 1211   BUN 70 (H) 12/09/2019 1211   CREATININE 4.47 (H) 12/09/2019 1211   CREATININE 1.36 (H) 07/24/2011 0925   CALCIUM 8.2 (L)  12/09/2019 1211   CALCIUM 8.1 (L) 12/09/2019 1211   GFRNONAA 10 (L) 12/09/2019 1211   GFRAA 13 (L) 10/01/2019 1217    BNP    Component Value Date/Time   BNP 58.5 09/04/2017 2139    ProBNP    Component Value Date/Time   PROBNP 65.0 05/20/2014 1620    Imaging: No results found.   Assessment & Plan:   Obstructive sleep apnea - Complains of daytime fatigue. Patient is 87% compliant with CPAP use, wearing on average 3 hours 27 mins - Pressure 9cm h20; airleaks 107L/min (95%); residual AHI 16.2 - Change CPAP to auto titrate 5-15cm h20 - Encouraged patient to aim to wear CPAP for min 4-6 hours or more and while  taking naps - FU 8-12 weeks with Dr. Halford Chessman    Chronic obstructive asthma ([place]) - Stable; uses SABA 2-3 times a week  - Continue Trelegy Ellipta one puff daily in the morning (rise mouth after use) - Use Albuterol rescue inhaler 2 puffs every 6 hours as needed for breakthrough shortness of breath or wheezing  - Take mucinex 600mg  twice daily with glass of water for chest congestion/mucus (over the counter)   NOTE: Unable to change CPAP to auto setting, patient's CPAP machine is only able to use fixed pressure. Patient needs in lab CPAP titration study   Martyn Ehrich, NP 12/15/2019

## 2019-11-18 NOTE — Patient Instructions (Addendum)
Sleep apnea: - Please wear CPAP when taking naps during the day and at night  - Aim to wear minimum 4-6 hours OR MORE at night  - Do not drive if experiencing excessive daytime fatigue  COPD - Continue Trelegy Ellipta one puff daily in the morning (rise mouth after use) - Use Albuterol rescue inhaler 2 puffs every 6 hours as needed for breakthrough shortness of breath or wheezing  - Take mucinex 600mg  twice daily with glass of water for chest congestion/mucus (over the counter)  Nasal congestion: - You can try Flonase nasal spray once daily (over the counter)  Orders: - Change CPAP to auto titrate 5-15cm h20  Follow-up: - 8-12 weeks with Dr. Halford Chessman

## 2019-11-18 NOTE — Progress Notes (Deleted)
Cardiology Office Note   Date:  11/18/2019   ID:  Joann Gutierrez, DOB 09-23-1945, MRN [id number]  PCP:  Andree Moro, DO  Cardiologist:   Minus Breeding, MD   No chief complaint on file.     History of Present Illness: Joann Gutierrez is a 74 y.o. female who presents for follow up of chest pain.    In the hospital in August with tension and altered mental status.  At a follow-up visit she was occasionally getting chest discomfort.  She has a difficult time getting around because of weakness.  At the last visit she had chest pain but had no significant findings on [place] in Nov 2019.   Since I last saw her ***   *** she has had no new cardiovascular complaints.  She denies chest pressure or neck discomfort.  She has some chronic dyspnea.  She is had a cold recently which complicates things.  She gets around very slowly because of joint pains and morbid obesity.  She uses a cane.  She does go to physical therapy and she can ride a bike there apparently.  She is had no problems with chest pain or excessive shortness of breath doing this.   Past Medical History:  Diagnosis Date  . Adenomatous colon polyp   . Allergy   . Anemia   . Asthma       . CAD (coronary artery disease)    Mild very minimal coronary disease with 20% obtuse marginal stenosis  . Carpal tunnel syndrome on left   . Cataract   . CHF (congestive heart failure) ([place])   . Chronic kidney disease (CKD), stage III (moderate) (HCC)    now stage 4  . COPD (chronic obstructive pulmonary disease) ([place])   . Depression   . 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 Diverticulosis   . Dyspnea   . 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 Esophageal dysmotility   . Esophageal stricture   . Gastritis   . Gastroparesis 08/21/2007  . GERD (gastroesophageal reflux disease)   . Hearing loss of both ears   . Hernia, hiatal   . Hyperlipidemia   . Hypertension   . Morbid obesity ([place])   .  OSTEOARTHRITIS 08/09/2006  . Osteoporosis   . Oxygen deficiency    prn   . 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 RENAL INSUFFICIENCY 02/16/2009  . 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 Seizures ([place])    pt thinks it has been several monthes since she had a seisure  . Shingles   . Sickle cell trait ([place])   . Sleep apnea    uses cpap  . Stroke [facility])     Past Surgical History:  Procedure Laterality Date  . ABDOMINAL HYSTERECTOMY    . ARTERY BIOPSY  01/07/2011   Procedure: MINOR BIOPSY TEMPORAL ARTERY;  Surgeon: Haywood Lasso, MD;  Location: [place];  Service: General;  Laterality: Left;  left temporal artery biopsy  . AV FISTULA PLACEMENT Right 11/16/2018   Procedure: CREATION RIGHT BRACHIOCEPHALIC FISTULA  ARTERIOVENOUS FISTULA;  Surgeon: Angelia Mould, MD;  Location: [place];  Service: Vascular;  Laterality: Right;  . bil foot surgery    . BREAST LUMPECTOMY     benign  . BREAST LUMPECTOMY     both breast lumps removed   . CATARACT EXTRACTION Left 06/2016   Dr. Read Drivers  . COLONOSCOPY    .  COLONOSCOPY WITH PROPOFOL N/A 07/05/2015   Procedure: COLONOSCOPY WITH PROPOFOL;  Surgeon: Manus Gunning, MD;  [facility];  Service: Gastroenterology;  Laterality: N/A;  . COLONOSCOPY WITH PROPOFOL N/A 08/04/2018   Procedure: COLONOSCOPY WITH PROPOFOL;  Surgeon: Yetta Flock, MD;  [facility];  Service: Gastroenterology;  Laterality: N/A;  . ERD  08/08/2000  . ESOPHAGEAL MANOMETRY N/A 03/13/2015   Procedure: ESOPHAGEAL MANOMETRY (EM);  Surgeon: Manus Gunning, MD;  [facility];  Service: Gastroenterology;  Laterality: N/A;  . ESOPHAGOGASTRODUODENOSCOPY  06/25/2006  . ESOPHAGOGASTRODUODENOSCOPY (EGD) WITH PROPOFOL N/A 07/05/2015   Procedure: ESOPHAGOGASTRODUODENOSCOPY (EGD) WITH PROPOFOL;  Surgeon: Manus Gunning, MD;  [facility];  Service: Gastroenterology;  Laterality: N/A;  .  FISTULA SUPERFICIALIZATION Right 11/16/2018   Procedure: Fistula Superficialization;  Surgeon: Angelia Mould, MD;  Location: [place];  Service: Vascular;  Laterality: Right;  . HERNIA REPAIR    . LEFT AND RIGHT HEART CATHETERIZATION WITH CORONARY ANGIOGRAM N/A 03/03/2013   Procedure: LEFT AND RIGHT HEART CATHETERIZATION WITH CORONARY ANGIOGRAM;  Surgeon: Minus Breeding, MD;  Location: [facility] C[facility] LAB;  Service: Cardiovascular;  Laterality: N/A;  . REPLACEMENT TOTAL KNEE Left 1998  . UPPER GASTROINTESTINAL ENDOSCOPY       Current Outpatient Medications  Medication Sig Dispense Refill  . albuterol (VENTOLIN HFA) 108 (90 Base) MCG/ACT inhaler Inhale 2 puffs into the lungs every 6 (six) hours as needed for wheezing or shortness of breath. 18 g 6  . amLODipine (NORVASC) 10 MG tablet Take 10 mg by mouth every evening.    Marland Kitchen aspirin EC 81 MG tablet Take 81 mg by mouth daily.    Marland Kitchen azelastine (ASTELIN) 0.1 % nasal spray Place 1 spray into both nostrils 2 (two) times daily as needed (allergies). Use in each nostril as directed 30 mL 5  . calcium carbonate (OSCAL) 1500 (600 Ca) MG TABS tablet Take 1 tablet by mouth daily.    . carvedilol (COREG) 25 MG tablet Take 25 mg by mouth 2 (two) times daily.    Marland Kitchen donepezil (ARICEPT) 10 MG tablet Take 10 mg by mouth at bedtime.    Marland Kitchen ezetimibe (ZETIA) 10 MG tablet Take 10 mg by mouth daily.     . ferrous sulfate 325 (65 FE) MG tablet Take 325 mg by mouth daily.    . Fluticasone-Umeclidin-Vilant (TRELEGY ELLIPTA) 100-62.5-25 MCG/INH AEPB Take 1 puff by mouth daily. 60 each 5  . furosemide (LASIX) 80 MG tablet Take 160 mg by mouth 2 (two) times daily.     Marland Kitchen gabapentin (NEURONTIN) 100 MG capsule Take 1 capsule (100 mg total) by mouth 2 (two) times daily. (Patient taking differently: Take 100 mg by mouth at bedtime. )    . hydrALAZINE (APRESOLINE) 100 MG tablet Take 1 tablet by mouth 3 (three) times daily.    Marland Kitchen ketorolac (ACULAR) 0.4 % SOLN Place 1 drop into the left  eye 2 (two) times a day.    Marland Kitchen LEVEMIR FLEXTOUCH 100 UNIT/ML Pen Inject 18 Units into the skin daily. (Patient taking differently: Inject 30 Units into the skin daily. )    . losartan (COZAAR) 100 MG tablet Take 100 mg by mouth daily.    . Misc Natural Products (NEURIVA PO) Take 1 capsule by mouth daily.    . montelukast (SINGULAIR) 10 MG tablet Take 1 tablet (10 mg total) by mouth daily. (Patient taking differently: Take 10 mg by mouth at bedtime. ) 30 tablet 3  . nitroGLYCERIN (  NITROSTAT) 0.4 MG SL tablet PLACE ONE TABLET UNDER THE TONGUE EVERY FIVE MINUTES AS NEEDED FOR CHEST PAIN 25 tablet 6  . NOVOLOG FLEXPEN 100 UNIT/ML FlexPen INJECT 5 UNITS SUBCUTANEOUSLY 3 TIMES DAILY AS DIRECTED SLIDING SCALE    . omeprazole (PRILOSEC) 40 MG capsule Take 1 capsule (40 mg total) by mouth in the morning and at bedtime. Please schedule a yearly follow up: [phone]. Thank you 60 capsule 1  . OXYGEN Inhale 2 L into the lungs as needed. CPAP with oxygen at bedtime     . polyethylene glycol (MIRALAX) 17 g packet Take 17 g by mouth daily as needed. (Patient taking differently: Take 17 g by mouth daily as needed (constipation.). ) 14 each 0  . promethazine (PHENERGAN) 12.5 MG tablet Take 1 tablet by mouth daily.    . raloxifene (EVISTA) 60 MG tablet Take 60 mg by mouth every evening.     Marland Kitchen spironolactone (ALDACTONE) 25 MG tablet Take 25 mg by mouth daily.    . TRUEPLUS PEN NEEDLES 32G X 4 MM MISC INJECT THREE TIMES A DAY AS DIRECTED  1  . Vitamin D, Ergocalciferol, (DRISDOL) 50000 UNITS CAPS capsule Take 50,000 Units by mouth every Friday.      No current facility-administered medications for this visit.    Allergies:   Morphine and related and Promethazine hcl    ROS:  Please see the history of present illness.   Otherwise, review of systems are positive for ***.   All other systems are reviewed and negative.    PHYSICAL EXAM: VS:  There were no vitals taken for this visit. , BMI There is no height or  weight on file to calculate BMI. GENERAL:  Well appearing NECK:  No jugular venous distention, waveform within normal limits, carotid upstroke brisk and symmetric, no bruits, no thyromegaly LUNGS:  Clear to auscultation bilaterally CHEST:  Unremarkable HEART:  PMI not displaced or sustained,S1 and S2 within normal limits, no S3, no S4, no clicks, no rubs, *** murmurs ABD:  Flat, positive bowel sounds normal in frequency in pitch, no bruits, no rebound, no guarding, no midline pulsatile mass, no hepatomegaly, no splenomegaly EXT:  2 plus pulses throughout, no edema, no cyanosis no clubbing    ***GENERAL:  Well appearing NECK:  No jugular venous distention, waveform within normal limits, carotid upstroke brisk and symmetric, no bruits, no thyromegaly LUNGS:  Clear to auscultation bilaterally CHEST:  Unremarkable HEART:  PMI not displaced or sustained,S1 and S2 within normal limits, no S3, no S4, no clicks, no rubs, no murmurs ABD:  Flat, positive bowel sounds normal in frequency in pitch, no bruits, no rebound, no guarding, no midline pulsatile mass, no hepatomegaly, no splenomegaly EXT:  2 plus pulses throughout, no edema, no cyanosis no clubbing   EKG:  EKG is *** ordered today. Sinus rhythm, rate ***, axis within normal limits, intervals within normal limits, poor anterior R wave progression   Recent Labs: 10/29/2019: BUN 46; Creatinine, Ser 3.53; Potassium 4.6; Sodium 145 11/12/2019: Hemoglobin 9.1    Lipid Panel    Component Value Date/Time   CHOL 238 (H) 09/06/2017 0308   TRIG 174 (H) 09/06/2017 0308   HDL 37 (L) 09/06/2017 0308   CHOLHDL 6.4 09/06/2017 0308   VLDL 35 09/06/2017 0308   LDLCALC 166 (H) 09/06/2017 0308   LDLDIRECT 190.0 01/02/2009 1101      Wt Readings from Last 3 Encounters:  11/18/19 264 lb (119.7 kg)  04/13/19 265 lb 6.4  oz (120.4 kg)  01/13/19 263 lb 12.8 oz (119.7 kg)      Other studies Reviewed: Additional studies/ records that were reviewed  today include: ***. Review of the above records demonstrates:  Please see elsewhere in the note.     ASSESSMENT AND PLAN:  CHEST PAIN:   ***  She had no high risk findings on perfusion study last year.   No change in therapy is planned no further imaging.  ACUTE ON CHRONIC DIASTOLIC HF:   ***  Her fluid meds have been followed by nephrology.   I think she seems to be euvolemic today.  She has follow-up next month with Dr. Posey Pronto to check her creatinine which was elevated in January compared to her baseline.  HTN: Her blood pressure is *** not controlled.  I am going to add Norvasc 2.5 mg daily.  I do see that she was on amlodipine in the past and I do not see this listed as an allergy and will start this again.  DM: Her A1c was *** not controlled.  I have asked her to schedule appointment with Dr. polite to discuss nothing but diabetes management recently when she was 11.5.  DYSLIPIDEMIA:     ***   f note she had pancreatitis and abdominal pain and her statin was held in the past.  Given her multiple comorbidities am somewhat reluctant to restart 1 of these.  She remains on Zetia.  I do not really think she is a candidate for PCSK9 at this point until she gets some of her other risk factors cared for and has better diet.    Current medicines are reviewed at length with the patient today.  The patient does not have concerns regarding medicines.  The following changes have been made:  no change  Labs/ tests ordered today include:   No orders of the defined types were placed in this encounter.    Disposition:   FU with Rosaria Ferries, PAC in six months.     Signed, Minus Breeding, MD  11/18/2019 8:21 PM    [facility]

## 2019-11-19 ENCOUNTER — Ambulatory Visit: Payer: Medicare HMO | Admitting: Cardiology

## 2019-11-19 DIAGNOSIS — I10 Essential (primary) hypertension: Secondary | ICD-10-CM

## 2019-11-19 DIAGNOSIS — E118 Type 2 diabetes mellitus with unspecified complications: Secondary | ICD-10-CM

## 2019-11-19 DIAGNOSIS — I5033 Acute on chronic diastolic (congestive) heart failure: Secondary | ICD-10-CM

## 2019-11-19 DIAGNOSIS — R072 Precordial pain: Secondary | ICD-10-CM

## 2019-11-19 DIAGNOSIS — E785 Hyperlipidemia, unspecified: Secondary | ICD-10-CM

## 2019-11-26 ENCOUNTER — Encounter (HOSPITAL_COMMUNITY): Payer: Medicare HMO

## 2019-12-09 ENCOUNTER — Encounter (HOSPITAL_COMMUNITY)
Admission: RE | Admit: 2019-12-09 | Discharge: 2019-12-09 | Disposition: A | Discharge status: Home / Self Care | Payer: Medicare HMO | Source: Ambulatory Visit | Attending: Nephrology | Admitting: Nephrology

## 2019-12-09 ENCOUNTER — Other Ambulatory Visit: Payer: Self-pay

## 2019-12-09 VITALS — BP 167/77

## 2019-12-09 DIAGNOSIS — D631 Anemia in chronic kidney disease: Secondary | ICD-10-CM | POA: Insufficient documentation

## 2019-12-09 DIAGNOSIS — N184 Chronic kidney disease, stage 4 (severe): Secondary | ICD-10-CM | POA: Insufficient documentation

## 2019-12-09 DIAGNOSIS — N183 Chronic kidney disease, stage 3 unspecified: Secondary | ICD-10-CM

## 2019-12-09 LAB — RENAL FUNCTION PANEL
Albumin: 3.2 g/dL — ABNORMAL LOW (ref 3.5–5.0)
Anion gap: 12 (ref 5–15)
BUN: 70 mg/dL — ABNORMAL HIGH (ref 8–23)
CO2: 22 mmol/L (ref 22–32)
Calcium: 8.2 mg/dL — ABNORMAL LOW (ref 8.9–10.3)
Chloride: 109 mmol/L (ref 98–111)
Creatinine, Ser: 4.47 mg/dL — ABNORMAL HIGH (ref 0.44–1.00)
GFR, Estimated: 10 mL/min — ABNORMAL LOW (ref >60)
Glucose, Bld: 159 mg/dL — ABNORMAL HIGH (ref 70–99)
Phosphorus: 4.5 mg/dL (ref 2.5–4.6)
Potassium: 4.6 mmol/L (ref 3.5–5.1)
Sodium: 143 mmol/L (ref 135–145)

## 2019-12-09 LAB — FERRITIN: Ferritin: 402 ng/mL — ABNORMAL HIGH (ref 11–307)

## 2019-12-09 LAB — IRON AND TIBC
Iron: 80 ug/dL (ref 28–170)
Saturation Ratios: 35 % — ABNORMAL HIGH (ref 10.4–31.8)
TIBC: 227 ug/dL — ABNORMAL LOW (ref 250–450)
UIBC: 147 ug/dL

## 2019-12-09 LAB — POCT HEMOGLOBIN-HEMACUE: Hemoglobin: 9.5 g/dL — ABNORMAL LOW (ref 12.0–15.0)

## 2019-12-09 MED ORDER — EPOETIN ALFA 40000 UNIT/ML IJ SOLN: 30000.0000 [IU] | INTRAMUSCULAR | Status: DC

## 2019-12-09 MED ORDER — EPOETIN ALFA 40000 UNIT/ML IJ SOLN
INTRAMUSCULAR | Status: AC
  Administered 2019-12-09: 30000 [IU]
  Filled 2019-12-09: qty 1

## 2019-12-09 NOTE — Progress Notes (Signed)
Cardiology Office Note   Date:  12/10/2019   ID:  Joann Gutierrez, DOB Apr 01, 1945, MRN [id number]  PCP:  Andree Moro, DO  Cardiologist:   Minus Breeding, MD   Chief Complaint  Patient presents with  . Chest Pain      History of Present Illness: Joann Gutierrez is a 74 y.o. female who presents for follow up of chest pain.    In the hospital in August with tension and altered mental status.  At a follow-up visit she was occasionally getting chest discomfort.  She has a difficult time getting around because of weakness.  At the last visit she had chest pain but had no significant findings on [place] in Nov 2019.   Since I last saw her she has had progressive renal insufficiency.  She is being followed by nephrology but is not yet at the need for dialysis.  She has not had any acute cardiac complaints.  She gets around slowly in her house with a cane mostly limited by back pain.  She has morbid obesity.  She has a caregiver named [name] who does all the cooking and housework.  When I talked to her about what she eats she told me that in the morning they might have eggs sausage and grits.  She might have a McRib for lunch and beings and we needs for dinner.  I see that her sugars not controlled.  Her hemoglobin A1c is 11.  She does have chest discomfort.  This comes and goes and has been evaluated with stress perfusion study as described and has been unchanged since that test which did not suggest allergy.  She denies any new shortness of breath, PND or orthopnea.  Has had no new palpitations, presyncope or syncope.  She has some mild lower extremity swelling.   Past Medical History:  Diagnosis Date  . Adenomatous colon polyp   . Allergy   . Anemia   . Asthma       . CAD (coronary artery disease)    Mild very minimal coronary disease with 20% obtuse marginal stenosis  . Carpal tunnel syndrome on left   . Cataract   . CHF (congestive heart failure) ([place])   . Chronic  kidney disease (CKD), stage III (moderate) (HCC)    now stage 4  . COPD (chronic obstructive pulmonary disease) ([place])   . Depression   . 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 Diverticulosis   . Dyspnea   . 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 Esophageal dysmotility   . Esophageal stricture   . Gastritis   . Gastroparesis 08/21/2007  . GERD (gastroesophageal reflux disease)   . Hearing loss of both ears   . Hernia, hiatal   . Hyperlipidemia   . Hypertension   . Morbid obesity ([place])   . OSTEOARTHRITIS 08/09/2006  . Osteoporosis   . Oxygen deficiency    prn   . 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 RENAL INSUFFICIENCY 02/16/2009  . 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 Seizures ([place])    pt thinks it has been several monthes since she had a seisure  . Shingles   . Sickle cell trait ([place])   . Sleep apnea    uses cpap  . Stroke [facility])     Past Surgical History:  Procedure Laterality Date  . ABDOMINAL HYSTERECTOMY    . ARTERY BIOPSY  01/07/2011   Procedure: MINOR BIOPSY TEMPORAL ARTERY;  Surgeon: Haywood Lasso, MD;  Location: [place];  Service: General;  Laterality: Left;  left temporal artery biopsy  . AV FISTULA PLACEMENT Right 11/16/2018   Procedure: CREATION RIGHT BRACHIOCEPHALIC FISTULA  ARTERIOVENOUS FISTULA;  Surgeon: Angelia Mould, MD;  Location: [place];  Service: Vascular;  Laterality: Right;  . bil foot surgery    . BREAST LUMPECTOMY     benign  . BREAST LUMPECTOMY     both breast lumps removed   . CATARACT EXTRACTION Left 06/2016   Dr. Read Drivers  . COLONOSCOPY    . COLONOSCOPY WITH PROPOFOL N/A 07/05/2015   Procedure: COLONOSCOPY WITH PROPOFOL;  Surgeon: Manus Gunning, MD;  [facility];  Service: Gastroenterology;  Laterality: N/A;  . COLONOSCOPY WITH PROPOFOL N/A 08/04/2018   Procedure: COLONOSCOPY WITH PROPOFOL;  Surgeon: Yetta Flock, MD;  [facility];  Service:  Gastroenterology;  Laterality: N/A;  . ERD  08/08/2000  . ESOPHAGEAL MANOMETRY N/A 03/13/2015   Procedure: ESOPHAGEAL MANOMETRY (EM);  Surgeon: Manus Gunning, MD;  [facility];  Service: Gastroenterology;  Laterality: N/A;  . ESOPHAGOGASTRODUODENOSCOPY  06/25/2006  . ESOPHAGOGASTRODUODENOSCOPY (EGD) WITH PROPOFOL N/A 07/05/2015   Procedure: ESOPHAGOGASTRODUODENOSCOPY (EGD) WITH PROPOFOL;  Surgeon: Manus Gunning, MD;  [facility];  Service: Gastroenterology;  Laterality: N/A;  . FISTULA SUPERFICIALIZATION Right 11/16/2018   Procedure: Fistula Superficialization;  Surgeon: Angelia Mould, MD;  Location: [place];  Service: Vascular;  Laterality: Right;  . HERNIA REPAIR    . LEFT AND RIGHT HEART CATHETERIZATION WITH CORONARY ANGIOGRAM N/A 03/03/2013   Procedure: LEFT AND RIGHT HEART CATHETERIZATION WITH CORONARY ANGIOGRAM;  Surgeon: Minus Breeding, MD;  [facility];  Service: Cardiovascular;  Laterality: N/A;  . REPLACEMENT TOTAL KNEE Left 1998  . UPPER GASTROINTESTINAL ENDOSCOPY       Current Outpatient Medications  Medication Sig Dispense Refill  . albuterol (VENTOLIN HFA) 108 (90 Base) MCG/ACT inhaler Inhale 2 puffs into the lungs every 6 (six) hours as needed for wheezing or shortness of breath. 18 g 6  . amLODipine (NORVASC) 10 MG tablet Take 10 mg by mouth every evening.    Marland Kitchen aspirin EC 81 MG tablet Take 81 mg by mouth daily.    Marland Kitchen azelastine (ASTELIN) 0.1 % nasal spray Place 1 spray into both nostrils 2 (two) times daily as needed (allergies). Use in each nostril as directed 30 mL 5  . calcium carbonate (OSCAL) 1500 (600 Ca) MG TABS tablet Take 1 tablet by mouth daily.    . carvedilol (COREG) 25 MG tablet Take 25 mg by mouth 2 (two) times daily.    Marland Kitchen donepezil (ARICEPT) 10 MG tablet Take 10 mg by mouth at bedtime.    Marland Kitchen ezetimibe (ZETIA) 10 MG tablet Take 10 mg by mouth daily.     . ferrous sulfate 325 (65 FE) MG tablet Take 325 mg by  mouth daily.    . Fluticasone-Umeclidin-Vilant (TRELEGY ELLIPTA) 100-62.5-25 MCG/INH AEPB Take 1 puff by mouth daily. 60 each 5  . furosemide (LASIX) 80 MG tablet Take 160 mg by mouth 2 (two) times daily.     Marland Kitchen gabapentin (NEURONTIN) 100 MG capsule Take 1 capsule (100 mg total) by mouth 2 (two) times daily. (Patient taking differently: Take 100 mg by mouth at bedtime. )    . hydrALAZINE (APRESOLINE) 100 MG tablet Take 1 tablet by mouth 3 (three) times daily.    Marland Kitchen ketorolac (ACULAR) 0.4 % SOLN Place 1  drop into the left eye 2 (two) times a day.    Marland Kitchen LEVEMIR FLEXTOUCH 100 UNIT/ML Pen Inject 18 Units into the skin daily. (Patient taking differently: Inject 30 Units into the skin daily. )    . losartan (COZAAR) 100 MG tablet Take 100 mg by mouth daily.    . Misc Natural Products (NEURIVA PO) Take 1 capsule by mouth daily.    . montelukast (SINGULAIR) 10 MG tablet Take 1 tablet (10 mg total) by mouth daily. (Patient taking differently: Take 10 mg by mouth at bedtime. ) 30 tablet 3  . nitroGLYCERIN (NITROSTAT) 0.4 MG SL tablet PLACE ONE TABLET UNDER THE TONGUE EVERY FIVE MINUTES AS NEEDED FOR CHEST PAIN 25 tablet 6  . NOVOLOG FLEXPEN 100 UNIT/ML FlexPen INJECT 5 UNITS SUBCUTANEOUSLY 3 TIMES DAILY AS DIRECTED SLIDING SCALE    . omeprazole (PRILOSEC) 40 MG capsule Take 1 capsule (40 mg total) by mouth in the morning and at bedtime. Please schedule a yearly follow up: [phone]. Thank you 60 capsule 1  . OXYGEN Inhale 2 L into the lungs as needed. CPAP with oxygen at bedtime     . polyethylene glycol (MIRALAX) 17 g packet Take 17 g by mouth daily as needed. (Patient taking differently: Take 17 g by mouth daily as needed (constipation.). ) 14 each 0  . promethazine (PHENERGAN) 12.5 MG tablet Take 1 tablet by mouth daily.    . raloxifene (EVISTA) 60 MG tablet Take 60 mg by mouth every evening.     Marland Kitchen spironolactone (ALDACTONE) 25 MG tablet Take 25 mg by mouth daily.    . TRUEPLUS PEN NEEDLES 32G X 4 MM MISC  INJECT THREE TIMES A DAY AS DIRECTED  1  . Vitamin D, Ergocalciferol, (DRISDOL) 50000 UNITS CAPS capsule Take 50,000 Units by mouth every Friday.     Marland Kitchen amLODipine (NORVASC) 2.5 MG tablet      No current facility-administered medications for this visit.    Allergies:   Morphine and related and Promethazine hcl    ROS:  Please see the history of present illness.   Otherwise, review of systems are positive for none.   All other systems are reviewed and negative.    PHYSICAL EXAM: VS:  BP (!) 142/70   Pulse 72   Ht 5' 4.5" (1.638 m)   Wt 264 lb 9.6 oz (120 kg)   SpO2 98%   BMI 44.72 kg/m  , BMI Body mass index is 44.72 kg/m. GEN:  No distress NECK:  No jugular venous distention at 90 degrees, waveform within normal limits, carotid upstroke brisk and symmetric, no bruits, no thyromegaly LYMPHATICS:  No cervical adenopathy LUNGS:  Clear to auscultation bilaterally BACK:  No CVA tenderness CHEST:  Unremarkable HEART:  S1 and S2 within normal limits, no S3, no S4, no clicks, no rubs, no murmurs ABD:  Positive bowel sounds normal in frequency in pitch, no bruits, no rebound, no guarding, unable to assess midline mass or bruit with the patient seated. EXT:  2 plus pulses throughout, moderate edema, no cyanosis no clubbing SKIN:  No rashes no nodules NEURO:  Cranial nerves II through XII grossly intact, motor grossly intact throughout PSYCH:  Cognitively intact, oriented to person place and time    EKG:  EKG is  ordered today. Sinus rhythm, rate 72, axis within normal limits, intervals within normal limits, poor anterior R wave progression   Recent Labs: 12/09/2019: BUN 70; Creatinine, Ser 4.47; Hemoglobin 9.5; Potassium 4.6; Sodium 143  Lipid Panel    Component Value Date/Time   CHOL 238 (H) 09/06/2017 0308   TRIG 174 (H) 09/06/2017 0308   HDL 37 (L) 09/06/2017 0308   CHOLHDL 6.4 09/06/2017 0308   VLDL 35 09/06/2017 0308   LDLCALC 166 (H) 09/06/2017 0308   LDLDIRECT  190.0 01/02/2009 1101      Wt Readings from Last 3 Encounters:  12/10/19 264 lb 9.6 oz (120 kg)  11/18/19 264 lb (119.7 kg)  04/13/19 265 lb 6.4 oz (120.4 kg)      Other studies Reviewed: Additional studies/ records that were reviewed today include: Labs. Review of the above records demonstrates:  Please see elsewhere in the note.     ASSESSMENT AND PLAN:  CHEST PAIN:    Her chest pain has nonanginal qualities for the most part.  It is a stable pattern since she had her stress test in 2019 which did not suggest ischemia.  I would refer her back to her primary providers for evaluation of nonanginal chest discomfort.   CHRONIC DIASTOLIC HF:   She seems to be euvolemic.  I do think she has too much salt.  Her diet is poor as above.  I am going to refer her to our care navigation team to discuss dietary changes.   HTN: Her blood pressure is controlled.  No change in therapy.   DM: Her A1c was 11.5.  However, the last labs I have are 2019.  She should follow-up with Andree Moro, DO for further management.  DYSLIPIDEMIA:     I do not see a recent lipid profile.  She has not been on statins because of multiple complaints.  I do not know that she be a good candidate to administer PCSK9 to herself.  We would be happy to consider this if requested by her primary provider after she is had aggressive attention paid to diet and diabetes control.   Current medicines are reviewed at length with the patient today.  The patient does not have concerns regarding medicines.  The following changes have been made:  no change  Labs/ tests ordered today include:   Orders Placed This Encounter  Procedures  . EKG 12-Lead     Disposition:   FU with our clinic as needed.     Signed, Minus Breeding, MD  12/10/2019 11:42 AM    [facility]

## 2019-12-10 ENCOUNTER — Ambulatory Visit (INDEPENDENT_AMBULATORY_CARE_PROVIDER_SITE_OTHER): Payer: Medicare HMO | Admitting: Cardiology

## 2019-12-10 ENCOUNTER — Other Ambulatory Visit: Payer: Self-pay

## 2019-12-10 ENCOUNTER — Encounter (HOSPITAL_COMMUNITY): Payer: Medicare HMO

## 2019-12-10 ENCOUNTER — Encounter: Payer: Self-pay | Admitting: Cardiology

## 2019-12-10 VITALS — BP 142/70 | HR 72 | Ht 64.5 in | Wt 264.6 lb

## 2019-12-10 DIAGNOSIS — I5033 Acute on chronic diastolic (congestive) heart failure: Secondary | ICD-10-CM

## 2019-12-10 DIAGNOSIS — I10 Essential (primary) hypertension: Secondary | ICD-10-CM

## 2019-12-10 DIAGNOSIS — R072 Precordial pain: Secondary | ICD-10-CM

## 2019-12-10 DIAGNOSIS — E118 Type 2 diabetes mellitus with unspecified complications: Secondary | ICD-10-CM

## 2019-12-10 DIAGNOSIS — E785 Hyperlipidemia, unspecified: Secondary | ICD-10-CM

## 2019-12-10 LAB — PTH, INTACT AND CALCIUM
Calcium, Total (PTH): 8.1 mg/dL — ABNORMAL LOW (ref 8.7–10.3)
PTH: 102 pg/mL — ABNORMAL HIGH (ref 15–65)

## 2019-12-10 NOTE — Patient Instructions (Signed)
Medication Instructions:  No changes *If you need a refill on your cardiac medications before your next appointment, please call your pharmacy*  Lab Work: None ordered this visit  Testing/Procedures: None ordered this visit  Follow-Up: Referral made to Care Navigation Team

## 2019-12-13 ENCOUNTER — Telehealth: Payer: Self-pay

## 2019-12-13 DIAGNOSIS — Z0000 Encounter for general adult medical examination without abnormal findings: Secondary | ICD-10-CM

## 2019-12-13 NOTE — Telephone Encounter (Signed)
Called patient to discuss health coaching for improving diet as recommended by Dr. Percival Spanish. Left patient a message to return call to Care Guide at [phone].

## 2019-12-15 ENCOUNTER — Telehealth: Payer: Self-pay | Admitting: Primary Care

## 2019-12-15 NOTE — Assessment & Plan Note (Addendum)
-   Complains of daytime fatigue. Patient is 87% compliant with CPAP use, wearing on average 3 hours 27 mins - Pressure 9cm h20; airleaks 107L/min (95%); residual AHI 16.2 - Change CPAP to auto titrate 5-15cm h20 - Encouraged patient to aim to wear CPAP for min 4-6 hours or more and while  taking naps - FU 8-12 weeks with Dr. Halford Chessman

## 2019-12-15 NOTE — Telephone Encounter (Signed)
Unable to change CPAP to auto setting, patient's CPAP machine is only able to use fixed pressure. Can we please order in lab CPAP titration study

## 2019-12-15 NOTE — Assessment & Plan Note (Signed)
-   Stable; uses SABA 2-3 times a week  - Continue Trelegy Ellipta one puff daily in the morning (rise mouth after use) - Use Albuterol rescue inhaler 2 puffs every 6 hours as needed for breakthrough shortness of breath or wheezing  - Take mucinex 600mg  twice daily with glass of water for chest congestion/mucus (over the counter)

## 2019-12-17 NOTE — Telephone Encounter (Signed)
Attempted to call pt but unable to reach. Left message for her to return call. 

## 2019-12-21 NOTE — Telephone Encounter (Signed)
Attempted to call pt but unable to reach. Left message for her to return call. 

## 2019-12-23 ENCOUNTER — Encounter (HOSPITAL_COMMUNITY)
Admission: RE | Admit: 2019-12-23 | Discharge: 2019-12-23 | Disposition: A | Discharge status: Home / Self Care | Payer: Medicare HMO | Source: Ambulatory Visit | Attending: Nephrology | Admitting: Nephrology

## 2019-12-23 ENCOUNTER — Other Ambulatory Visit: Payer: Self-pay

## 2019-12-23 VITALS — BP 152/74 | HR 66 | Temp 97.3°F | Resp 20

## 2019-12-23 DIAGNOSIS — N183 Chronic kidney disease, stage 3 unspecified: Secondary | ICD-10-CM | POA: Diagnosis present

## 2019-12-23 DIAGNOSIS — D631 Anemia in chronic kidney disease: Secondary | ICD-10-CM | POA: Diagnosis not present

## 2019-12-23 LAB — POCT HEMOGLOBIN-HEMACUE: Hemoglobin: 9.8 g/dL — ABNORMAL LOW (ref 12.0–15.0)

## 2019-12-23 MED ORDER — EPOETIN ALFA 40000 UNIT/ML IJ SOLN: 30000.0000 [IU] | INTRAMUSCULAR | Status: DC

## 2019-12-23 MED ORDER — EPOETIN ALFA 40000 UNIT/ML IJ SOLN
INTRAMUSCULAR | Status: AC
  Administered 2019-12-23: 30000 [IU] via SUBCUTANEOUS
  Filled 2019-12-23: qty 1

## 2019-12-23 NOTE — Telephone Encounter (Signed)
Attempted to call pt but unable to reach. Left message for her to return call. Due to multiple attempts trying to reach pt and unable to do so, message will be closed.

## 2020-01-06 ENCOUNTER — Other Ambulatory Visit: Payer: Self-pay

## 2020-01-06 ENCOUNTER — Encounter (HOSPITAL_COMMUNITY)
Admission: RE | Admit: 2020-01-06 | Discharge: 2020-01-06 | Disposition: A | Discharge status: Home / Self Care | Payer: Medicare HMO | Source: Ambulatory Visit | Attending: Nephrology | Admitting: Nephrology

## 2020-01-06 VITALS — BP 154/78 | HR 56

## 2020-01-06 DIAGNOSIS — N183 Chronic kidney disease, stage 3 unspecified: Secondary | ICD-10-CM

## 2020-01-06 LAB — RENAL FUNCTION PANEL
Albumin: 3.5 g/dL (ref 3.5–5.0)
Anion gap: 12 (ref 5–15)
BUN: 78 mg/dL — ABNORMAL HIGH (ref 8–23)
CO2: 20 mmol/L — ABNORMAL LOW (ref 22–32)
Calcium: 8.4 mg/dL — ABNORMAL LOW (ref 8.9–10.3)
Chloride: 111 mmol/L (ref 98–111)
Creatinine, Ser: 4.18 mg/dL — ABNORMAL HIGH (ref 0.44–1.00)
GFR, Estimated: 11 mL/min — ABNORMAL LOW (ref >60)
Glucose, Bld: 85 mg/dL (ref 70–99)
Phosphorus: 4.7 mg/dL — ABNORMAL HIGH (ref 2.5–4.6)
Potassium: 4.6 mmol/L (ref 3.5–5.1)
Sodium: 143 mmol/L (ref 135–145)

## 2020-01-06 LAB — IRON AND TIBC
Iron: 115 ug/dL (ref 28–170)
Saturation Ratios: 50 % — ABNORMAL HIGH (ref 10.4–31.8)
TIBC: 228 ug/dL — ABNORMAL LOW (ref 250–450)
UIBC: 113 ug/dL

## 2020-01-06 LAB — POCT HEMOGLOBIN-HEMACUE: Hemoglobin: 9.1 g/dL — ABNORMAL LOW (ref 12.0–15.0)

## 2020-01-06 LAB — FERRITIN: Ferritin: 375 ng/mL — ABNORMAL HIGH (ref 11–307)

## 2020-01-06 MED ORDER — EPOETIN ALFA 40000 UNIT/ML IJ SOLN: 30000.0000 [IU] | INTRAMUSCULAR | Status: DC

## 2020-01-06 MED ORDER — EPOETIN ALFA 40000 UNIT/ML IJ SOLN
INTRAMUSCULAR | Status: AC
  Administered 2020-01-06: 30000 [IU] via SUBCUTANEOUS
  Filled 2020-01-06: qty 1

## 2020-01-07 LAB — PTH, INTACT AND CALCIUM: Calcium, Total (PTH): 8.4 mg/dL — ABNORMAL LOW (ref 8.7–10.3)

## 2020-01-18 ENCOUNTER — Telehealth: Payer: Self-pay

## 2020-01-18 DIAGNOSIS — Z0000 Encounter for general adult medical examination without abnormal findings: Secondary | ICD-10-CM

## 2020-01-18 NOTE — Telephone Encounter (Signed)
Called patient to discuss health coaching for diet per Dr. Percival Spanish. Spoke with patient's husband while sleep. Wanted to schedule appointment for next week. Patient is tentatively scheduled for 01/25/20 at 11:00am.

## 2020-01-20 ENCOUNTER — Encounter (HOSPITAL_COMMUNITY)
Admission: RE | Admit: 2020-01-20 | Discharge: 2020-01-20 | Disposition: A | Discharge status: Home / Self Care | Payer: Medicare HMO | Source: Ambulatory Visit | Attending: Nephrology | Admitting: Nephrology

## 2020-01-20 ENCOUNTER — Other Ambulatory Visit: Payer: Self-pay

## 2020-01-20 VITALS — BP 133/60 | HR 72 | Temp 98.1°F | Resp 18

## 2020-01-20 DIAGNOSIS — N183 Chronic kidney disease, stage 3 unspecified: Secondary | ICD-10-CM | POA: Diagnosis not present

## 2020-01-20 LAB — CBC WITH DIFFERENTIAL/PLATELET
Abs Immature Granulocytes: 0.03 10*3/uL (ref 0.00–0.07)
Basophils Absolute: 0 10*3/uL (ref 0.0–0.1)
Basophils Relative: 1 %
Eosinophils Absolute: 0 10*3/uL (ref 0.0–0.5)
Eosinophils Relative: 1 %
HCT: 32.5 % — ABNORMAL LOW (ref 36.0–46.0)
Hemoglobin: 9.3 g/dL — ABNORMAL LOW (ref 12.0–15.0)
Immature Granulocytes: 1 %
Lymphocytes Relative: 28 %
Lymphs Abs: 1.1 10*3/uL (ref 0.7–4.0)
MCH: 21.2 pg — ABNORMAL LOW (ref 26.0–34.0)
MCHC: 28.6 g/dL — ABNORMAL LOW (ref 30.0–36.0)
MCV: 74.2 fL — ABNORMAL LOW (ref 80.0–100.0)
Monocytes Absolute: 0.8 10*3/uL (ref 0.1–1.0)
Monocytes Relative: 21 %
Neutro Abs: 2 10*3/uL (ref 1.7–7.7)
Neutrophils Relative %: 48 %
Platelets: 121 10*3/uL — ABNORMAL LOW (ref 150–400)
RBC: 4.38 MIL/uL (ref 3.87–5.11)
RDW: 19.9 % — ABNORMAL HIGH (ref 11.5–15.5)
WBC: 4 10*3/uL (ref 4.0–10.5)
nRBC: 0 % (ref 0.0–0.2)

## 2020-01-20 LAB — RENAL FUNCTION PANEL
Albumin: 3.4 g/dL — ABNORMAL LOW (ref 3.5–5.0)
Anion gap: 9 (ref 5–15)
BUN: 51 mg/dL — ABNORMAL HIGH (ref 8–23)
CO2: 18 mmol/L — ABNORMAL LOW (ref 22–32)
Calcium: 8.3 mg/dL — ABNORMAL LOW (ref 8.9–10.3)
Chloride: 115 mmol/L — ABNORMAL HIGH (ref 98–111)
Creatinine, Ser: 3.99 mg/dL — ABNORMAL HIGH (ref 0.44–1.00)
GFR, Estimated: 11 mL/min — ABNORMAL LOW (ref >60)
Glucose, Bld: 158 mg/dL — ABNORMAL HIGH (ref 70–99)
Phosphorus: 4.5 mg/dL (ref 2.5–4.6)
Potassium: 5.2 mmol/L — ABNORMAL HIGH (ref 3.5–5.1)
Sodium: 142 mmol/L (ref 135–145)

## 2020-01-20 LAB — IRON AND TIBC
Iron: 120 ug/dL (ref 28–170)
Saturation Ratios: 55 % — ABNORMAL HIGH (ref 10.4–31.8)
TIBC: 217 ug/dL — ABNORMAL LOW (ref 250–450)
UIBC: 97 ug/dL

## 2020-01-20 LAB — MAGNESIUM: Magnesium: 2.2 mg/dL (ref 1.7–2.4)

## 2020-01-20 LAB — POCT HEMOGLOBIN-HEMACUE: Hemoglobin: 9 g/dL — ABNORMAL LOW (ref 12.0–15.0)

## 2020-01-20 LAB — VITAMIN D 25 HYDROXY (VIT D DEFICIENCY, FRACTURES): Vit D, 25-Hydroxy: 62.24 ng/mL (ref 30–100)

## 2020-01-20 LAB — FERRITIN: Ferritin: 305 ng/mL (ref 11–307)

## 2020-01-20 MED ORDER — EPOETIN ALFA 10000 UNIT/ML IJ SOLN
INTRAMUSCULAR | Status: AC
  Filled 2020-01-20: qty 1

## 2020-01-20 MED ORDER — EPOETIN ALFA 20000 UNIT/ML IJ SOLN
INTRAMUSCULAR | Status: AC
  Filled 2020-01-20: qty 1

## 2020-01-20 MED ORDER — EPOETIN ALFA 40000 UNIT/ML IJ SOLN: 30000.0000 [IU] | INTRAMUSCULAR | Status: DC

## 2020-01-21 LAB — PTH, INTACT AND CALCIUM
Calcium, Total (PTH): 7.9 mg/dL — ABNORMAL LOW (ref 8.7–10.3)
PTH: 175 pg/mL — ABNORMAL HIGH (ref 15–65)

## 2020-01-21 MED FILL — Epoetin Alfa Inj 20000 Unit/ML: INTRAMUSCULAR | Qty: 1 | Status: AC

## 2020-01-21 MED FILL — Epoetin Alfa Inj 10000 Unit/ML: INTRAMUSCULAR | Qty: 1 | Status: AC

## 2020-01-25 ENCOUNTER — Telehealth: Payer: Self-pay

## 2020-01-25 ENCOUNTER — Ambulatory Visit: Payer: Self-pay

## 2020-01-25 DIAGNOSIS — Z0000 Encounter for general adult medical examination without abnormal findings: Secondary | ICD-10-CM

## 2020-01-25 NOTE — Telephone Encounter (Signed)
Called patient to discuss rescheduling missed appointment today at 11:00am. Left patient a message to call Care Guide at [phone].

## 2020-01-26 ENCOUNTER — Telehealth: Payer: Self-pay

## 2020-01-26 DIAGNOSIS — Z0000 Encounter for general adult medical examination without abnormal findings: Secondary | ICD-10-CM

## 2020-01-26 NOTE — Telephone Encounter (Signed)
Called patient again to determine if she wants to start health coaching or decided not to. Left message for her to call Care Guide at [phone].

## 2020-02-03 ENCOUNTER — Other Ambulatory Visit: Payer: Self-pay

## 2020-02-03 ENCOUNTER — Encounter (HOSPITAL_COMMUNITY)
Admission: RE | Admit: 2020-02-03 | Discharge: 2020-02-03 | Disposition: A | Discharge status: Home / Self Care | Payer: Medicare HMO | Source: Ambulatory Visit | Attending: Nephrology | Admitting: Nephrology

## 2020-02-03 VITALS — BP 175/87 | HR 72 | Temp 97.2°F | Resp 18

## 2020-02-03 DIAGNOSIS — N183 Chronic kidney disease, stage 3 unspecified: Secondary | ICD-10-CM | POA: Diagnosis present

## 2020-02-03 LAB — POCT HEMOGLOBIN-HEMACUE: Hemoglobin: 9.9 g/dL — ABNORMAL LOW (ref 12.0–15.0)

## 2020-02-03 MED ORDER — EPOETIN ALFA 40000 UNIT/ML IJ SOLN: 30000.0000 [IU] | INTRAMUSCULAR | Status: DC

## 2020-02-03 MED ORDER — EPOETIN ALFA 20000 UNIT/ML IJ SOLN
INTRAMUSCULAR | Status: AC
  Administered 2020-02-03: 20000 [IU] via SUBCUTANEOUS
  Filled 2020-02-03: qty 1

## 2020-02-03 MED ORDER — EPOETIN ALFA 10000 UNIT/ML IJ SOLN
INTRAMUSCULAR | Status: AC
  Administered 2020-02-03: 10000 [IU] via SUBCUTANEOUS
  Filled 2020-02-03: qty 1

## 2020-02-04 ENCOUNTER — Encounter: Payer: Self-pay | Admitting: Pulmonary Disease

## 2020-02-04 ENCOUNTER — Ambulatory Visit (INDEPENDENT_AMBULATORY_CARE_PROVIDER_SITE_OTHER): Payer: Medicare HMO | Admitting: Pulmonary Disease

## 2020-02-04 ENCOUNTER — Other Ambulatory Visit: Payer: Self-pay

## 2020-02-04 VITALS — BP 130/70 | HR 69 | Temp 98.2°F | Ht 64.0 in | Wt 256.0 lb

## 2020-02-04 DIAGNOSIS — J449 Chronic obstructive pulmonary disease, unspecified: Secondary | ICD-10-CM | POA: Diagnosis not present

## 2020-02-04 DIAGNOSIS — G4733 Obstructive sleep apnea (adult) (pediatric): Secondary | ICD-10-CM | POA: Diagnosis not present

## 2020-02-04 DIAGNOSIS — Z9989 Dependence on other enabling machines and devices: Secondary | ICD-10-CM

## 2020-02-04 DIAGNOSIS — G4734 Idiopathic sleep related nonobstructive alveolar hypoventilation: Secondary | ICD-10-CM | POA: Diagnosis not present

## 2020-02-04 NOTE — Patient Instructions (Signed)
Will have your CPAP changed to 11 cm water  Follow up in 6 months

## 2020-02-04 NOTE — Progress Notes (Signed)
Joann Gutierrez, Critical Care, and Sleep Medicine  Chief Complaint  Patient presents with  . Follow-up    Feels like something in her throat for about a month.  Feels like she is choking when she is eating.    States wearing CPAP machine.    Constitutional:  BP 130/70 (BP Location: Left Arm, Patient Position: Sitting, Cuff Size: Large)   Pulse 69   Temp 98.2 F (36.8 C) (Temporal)   Ht 5\' 4"  (1.626 m)   Wt 256 lb (116.1 kg)   SpO2 91%   BMI 43.94 kg/m   Past Medical History:  Colon polyp, CAD, Carpal tunnel, CKD 3, CVA, Depression, DM, Diverticulosis, Gastroparesis, GERD, Hiatal hernia, HLD, HTN, OA, Osteoporosis, neuropathy, seizures, Shingles, Sickle cell trait  Past Surgical History:  She  has a past surgical history that includes Abdominal hysterectomy; Replacement total knee (Left, 1998); Esophagogastroduodenoscopy (06/25/2006); ERD (08/08/2000); Artery Biopsy (01/07/2011); Breast lumpectomy; Breast lumpectomy; Hernia repair; Colonoscopy; Upper gastrointestinal endoscopy; bil foot surgery; left and right heart catheterization with coronary angiogram (N/A, 03/03/2013); Esophageal manometry (N/A, 03/13/2015); Esophagogastroduodenoscopy (egd) with propofol (N/A, 07/05/2015); Colonoscopy with propofol (N/A, 07/05/2015); Cataract extraction (Left, 06/2016); Colonoscopy with propofol (N/A, 08/04/2018); AV fistula placement (Right, 11/16/2018); and Fistula superficialization (Right, 11/16/2018).  Brief Summary:  Joann Gutierrez is a 75 y.o. 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      Subjective:   She is here with her husband.  I last saw her in November 2019.  More recently seen by Derl Barrow.  She doesn't feel like CPAP pressure is high enough.  Has been using oxygen at night.  Not having cough, wheeze, or sputum.  She has trouble with her hearing and her memory.  She still has trouble with her  swallowing.  She had dilation by GI several years ago.  Physical Exam:   Appearance - well kempt   ENMT - no sinus tenderness, no oral exudate, no LAN, Mallampati 3 airway, no stridor  Respiratory - equal breath sounds bilaterally, no wheezing or rales  CV - s1s2 regular rate and rhythm, no murmurs  Ext - no clubbing, no edema  Skin - no rashes  Psych - normal mood and affect   Gutierrez testing:   Spirometry 07/05/10 >> FEV1 1.30 (64%), FEV1% 80 >> coughing during test  RAST 12/11/12 >> negative, IgE 63.3  PFT 03/17/13 >> FEV1 1.27 (67%), FEV1% 84, TLC 4.06 (80%), DLCO 57%, borderline BD  Sleep Tests:   PSG 04/28/09 >> AHI 11, SpO2 low 68%  ONO with CPAP and room air 10/22/12 >> Test time 10 hrs 59 min. Mean SpO2 92.4%, low SpO2 75%. Spent 16 min with SpO2 < 89%.  CPAP 01/05/20 to 02/03/20 >> used on 14 to 30 nights with average 2 hrs 21 min.  Average AHI 22.2 with CPAP 9 cm H2O.  Cardiac Tests:   Lt, Rt Heart cath 03/03/13 >> RA 10, RV 38/7, PA 41/16 28, PCWP 13, LV 142/5, AO 137/76  Echo 09/08/17 >> EF 65 to 70%, grade 1 DD, PAS 39 mmHg  Social History:  She  reports that she quit smoking about 39 years ago. She has a 5.00 pack-year smoking history. She has never used smokeless tobacco. She reports that she does not drink alcohol and does not use drugs.  Family History:  Her family history includes Allergies in her sister; Cancer in her brother; Colon cancer in her brother; Diabetes in  her mother, sister, and sister; Heart attack in her father; Heart disease in her mother, sister, and sister; Heart disease (age of onset: 50) in her father; Kidney disease in her mother, sister, and sister; Liver cancer in her mother; Sickle cell anemia in her father.     Assessment/Plan:   COPD with asthma. - continue trelegy, singulair - prn albuterol - discussed different roles for her inhalers  Upper airway cough syndrome with post nasal drip. - continue azelastin,  singulair  Chronic respiratory failure from COPD/asthma, and Rt hemidiaphragm elevation. - 2 liters oxygen with CPAP  Obstructive sleep apnea. - encouraged her to resume CPAP therapy - discussed how untreated sleep apnea can impact her health - will have her CPAP setting increased to 11 cm H2O  Dysphagia. - they will call if this progresses and then will refer back to GI  Diastolic CHF. - followed by Dr. Marijo File with [facility]  Time Spent Involved in Patient Care on Day of Examination:  33 minutes  Follow up:  Patient Instructions  Will have your CPAP changed to 11 cm water  Follow up in 6 months   Medication List:   Allergies as of 02/04/2020      Reactions   Morphine And Related Other (See Comments)   Family request not to be given, reports pt does not wake up when given    Promethazine Hcl Other (See Comments)   REACTION: lethargy      Medication List       Accurate as of February 04, 2020 12:35 PM. If you have any questions, ask your nurse or doctor.        albuterol 108 (90 Base) MCG/ACT inhaler Commonly known as: VENTOLIN HFA Inhale 2 puffs into the lungs every 6 (six) hours as needed for wheezing or shortness of breath.   amLODipine 10 MG tablet Commonly known as: NORVASC Take 10 mg by mouth every evening.   amLODipine 2.5 MG tablet Commonly known as: NORVASC   aspirin EC 81 MG tablet Take 81 mg by mouth daily.   azelastine 0.1 % nasal spray Commonly known as: ASTELIN Place 1 spray into both nostrils 2 (two) times daily as needed (allergies). Use in each nostril as directed   calcium carbonate 1500 (600 Ca) MG Tabs tablet Commonly known as: OSCAL Take 1 tablet by mouth daily.   carvedilol 25 MG tablet Commonly known as: COREG Take 25 mg by mouth 2 (two) times daily.   donepezil 10 MG tablet Commonly known as: ARICEPT Take 10 mg by mouth at bedtime.   ezetimibe 10 MG tablet Commonly known as: ZETIA Take 10 mg by mouth  daily.   ferrous sulfate 325 (65 FE) MG tablet Take 325 mg by mouth daily.   furosemide 80 MG tablet Commonly known as: LASIX Take 160 mg by mouth 2 (two) times daily.   gabapentin 100 MG capsule Commonly known as: NEURONTIN Take 1 capsule (100 mg total) by mouth 2 (two) times daily. What changed: when to take this   hydrALAZINE 100 MG tablet Commonly known as: APRESOLINE Take 1 tablet by mouth 3 (three) times daily.   ketorolac 0.4 % Soln Commonly known as: ACULAR Place 1 drop into the left eye 2 (two) times a day.   Levemir FlexTouch 100 UNIT/ML FlexPen Generic drug: insulin detemir Inject 18 Units into the skin daily. What changed: how much to take   losartan 100 MG tablet Commonly known as: COZAAR Take 100 mg by  mouth daily.   montelukast 10 MG tablet Commonly known as: SINGULAIR Take 1 tablet (10 mg total) by mouth daily. What changed: when to take this   NEURIVA PO Take 1 capsule by mouth daily.   nitroGLYCERIN 0.4 MG SL tablet Commonly known as: NITROSTAT PLACE ONE TABLET UNDER THE TONGUE EVERY FIVE MINUTES AS NEEDED FOR CHEST PAIN   NovoLOG FlexPen 100 UNIT/ML FlexPen Generic drug: insulin aspart INJECT 5 UNITS SUBCUTANEOUSLY 3 TIMES DAILY AS DIRECTED SLIDING SCALE   omeprazole 40 MG capsule Commonly known as: PRILOSEC Take 1 capsule (40 mg total) by mouth in the morning and at bedtime. Please schedule a yearly follow up: [phone]. Thank you   OXYGEN Inhale 2 L into the lungs as needed. CPAP with oxygen at bedtime   polyethylene glycol 17 g packet Commonly known as: MiraLax Take 17 g by mouth daily as needed. What changed: reasons to take this   promethazine 12.5 MG tablet Commonly known as: PHENERGAN Take 1 tablet by mouth daily.   raloxifene 60 MG tablet Commonly known as: EVISTA Take 60 mg by mouth every evening.   spironolactone 25 MG tablet Commonly known as: ALDACTONE Take 25 mg by mouth daily.   Trelegy Ellipta 100-62.5-25 MCG/INH  Aepb Generic drug: Fluticasone-Umeclidin-Vilant Take 1 puff by mouth daily.   TRUEplus Pen Needles 32G X 4 MM Misc Generic drug: Insulin Pen Needle INJECT THREE TIMES A DAY AS DIRECTED   Vitamin D (Ergocalciferol) 1.25 MG (50000 UNIT) Caps capsule Commonly known as: DRISDOL Take 50,000 Units by mouth every Friday.       Signature:  [name], MD [facility] Pager - [phone] 02/04/2020, 12:35 PM

## 2020-02-15 ENCOUNTER — Emergency Department (HOSPITAL_COMMUNITY): Payer: Medicare HMO

## 2020-02-15 ENCOUNTER — Other Ambulatory Visit: Payer: Self-pay

## 2020-02-15 ENCOUNTER — Encounter (HOSPITAL_COMMUNITY): Payer: Self-pay

## 2020-02-15 ENCOUNTER — Inpatient Hospital Stay (HOSPITAL_COMMUNITY)
Admission: EM | Admit: 2020-02-15 | Discharge: 2020-03-02 | DRG: 871 | Disposition: A | Discharge status: Skilled Nursing Facility | Payer: Medicare HMO | Attending: Internal Medicine | Admitting: Internal Medicine

## 2020-02-15 DIAGNOSIS — J440 Chronic obstructive pulmonary disease with acute lower respiratory infection: Secondary | ICD-10-CM | POA: Diagnosis present

## 2020-02-15 DIAGNOSIS — Z8673 Personal history of transient ischemic attack (TIA), and cerebral infarction without residual deficits: Secondary | ICD-10-CM

## 2020-02-15 DIAGNOSIS — N179 Acute kidney failure, unspecified: Secondary | ICD-10-CM | POA: Diagnosis present

## 2020-02-15 DIAGNOSIS — N184 Chronic kidney disease, stage 4 (severe): Secondary | ICD-10-CM | POA: Diagnosis present

## 2020-02-15 DIAGNOSIS — Z90710 Acquired absence of both cervix and uterus: Secondary | ICD-10-CM

## 2020-02-15 DIAGNOSIS — J9621 Acute and chronic respiratory failure with hypoxia: Secondary | ICD-10-CM | POA: Diagnosis present

## 2020-02-15 DIAGNOSIS — E11649 Type 2 diabetes mellitus with hypoglycemia without coma: Secondary | ICD-10-CM | POA: Diagnosis not present

## 2020-02-15 DIAGNOSIS — R32 Unspecified urinary incontinence: Secondary | ICD-10-CM | POA: Diagnosis present

## 2020-02-15 DIAGNOSIS — H9190 Unspecified hearing loss, unspecified ear: Secondary | ICD-10-CM | POA: Diagnosis present

## 2020-02-15 DIAGNOSIS — J9601 Acute respiratory failure with hypoxia: Secondary | ICD-10-CM | POA: Diagnosis not present

## 2020-02-15 DIAGNOSIS — U071 COVID-19: Secondary | ICD-10-CM | POA: Diagnosis present

## 2020-02-15 DIAGNOSIS — D6959 Other secondary thrombocytopenia: Secondary | ICD-10-CM | POA: Diagnosis present

## 2020-02-15 DIAGNOSIS — Z7982 Long term (current) use of aspirin: Secondary | ICD-10-CM

## 2020-02-15 DIAGNOSIS — R600 Localized edema: Secondary | ICD-10-CM | POA: Diagnosis not present

## 2020-02-15 DIAGNOSIS — Z713 Dietary counseling and surveillance: Secondary | ICD-10-CM

## 2020-02-15 DIAGNOSIS — I2510 Atherosclerotic heart disease of native coronary artery without angina pectoris: Secondary | ICD-10-CM | POA: Diagnosis present

## 2020-02-15 DIAGNOSIS — Z9981 Dependence on supplemental oxygen: Secondary | ICD-10-CM

## 2020-02-15 DIAGNOSIS — D573 Sickle-cell trait: Secondary | ICD-10-CM | POA: Diagnosis present

## 2020-02-15 DIAGNOSIS — R109 Unspecified abdominal pain: Secondary | ICD-10-CM

## 2020-02-15 DIAGNOSIS — A419 Sepsis, unspecified organism: Secondary | ICD-10-CM

## 2020-02-15 DIAGNOSIS — A4189 Other specified sepsis: Secondary | ICD-10-CM | POA: Diagnosis present

## 2020-02-15 DIAGNOSIS — E1169 Type 2 diabetes mellitus with other specified complication: Secondary | ICD-10-CM | POA: Diagnosis not present

## 2020-02-15 DIAGNOSIS — Z992 Dependence on renal dialysis: Secondary | ICD-10-CM

## 2020-02-15 DIAGNOSIS — I5032 Chronic diastolic (congestive) heart failure: Secondary | ICD-10-CM | POA: Diagnosis present

## 2020-02-15 DIAGNOSIS — R451 Restlessness and agitation: Secondary | ICD-10-CM | POA: Diagnosis not present

## 2020-02-15 DIAGNOSIS — M810 Age-related osteoporosis without current pathological fracture: Secondary | ICD-10-CM | POA: Diagnosis present

## 2020-02-15 DIAGNOSIS — N2581 Secondary hyperparathyroidism of renal origin: Secondary | ICD-10-CM | POA: Diagnosis present

## 2020-02-15 DIAGNOSIS — B999 Unspecified infectious disease: Secondary | ICD-10-CM

## 2020-02-15 DIAGNOSIS — Z79899 Other long term (current) drug therapy: Secondary | ICD-10-CM

## 2020-02-15 DIAGNOSIS — J1282 Pneumonia due to coronavirus disease 2019: Secondary | ICD-10-CM | POA: Diagnosis present

## 2020-02-15 DIAGNOSIS — Z794 Long term (current) use of insulin: Secondary | ICD-10-CM

## 2020-02-15 DIAGNOSIS — I132 Hypertensive heart and chronic kidney disease with heart failure and with stage 5 chronic kidney disease, or end stage renal disease: Secondary | ICD-10-CM | POA: Diagnosis present

## 2020-02-15 DIAGNOSIS — Z560 Unemployment, unspecified: Secondary | ICD-10-CM

## 2020-02-15 DIAGNOSIS — E1122 Type 2 diabetes mellitus with diabetic chronic kidney disease: Secondary | ICD-10-CM | POA: Diagnosis present

## 2020-02-15 DIAGNOSIS — E872 Acidosis: Secondary | ICD-10-CM | POA: Diagnosis present

## 2020-02-15 DIAGNOSIS — N186 End stage renal disease: Secondary | ICD-10-CM | POA: Diagnosis present

## 2020-02-15 DIAGNOSIS — G4733 Obstructive sleep apnea (adult) (pediatric): Secondary | ICD-10-CM | POA: Diagnosis present

## 2020-02-15 DIAGNOSIS — E785 Hyperlipidemia, unspecified: Secondary | ICD-10-CM | POA: Diagnosis present

## 2020-02-15 DIAGNOSIS — B029 Zoster without complications: Secondary | ICD-10-CM | POA: Diagnosis present

## 2020-02-15 DIAGNOSIS — E1165 Type 2 diabetes mellitus with hyperglycemia: Secondary | ICD-10-CM | POA: Diagnosis present

## 2020-02-15 DIAGNOSIS — G9341 Metabolic encephalopathy: Secondary | ICD-10-CM | POA: Diagnosis present

## 2020-02-15 DIAGNOSIS — F0390 Unspecified dementia without behavioral disturbance: Secondary | ICD-10-CM | POA: Diagnosis present

## 2020-02-15 DIAGNOSIS — Z6841 Body Mass Index (BMI) 40.0 and over, adult: Secondary | ICD-10-CM

## 2020-02-15 DIAGNOSIS — J449 Chronic obstructive pulmonary disease, unspecified: Secondary | ICD-10-CM

## 2020-02-15 DIAGNOSIS — Z841 Family history of disorders of kidney and ureter: Secondary | ICD-10-CM

## 2020-02-15 DIAGNOSIS — E1142 Type 2 diabetes mellitus with diabetic polyneuropathy: Secondary | ICD-10-CM | POA: Diagnosis present

## 2020-02-15 DIAGNOSIS — Z833 Family history of diabetes mellitus: Secondary | ICD-10-CM

## 2020-02-15 DIAGNOSIS — R7989 Other specified abnormal findings of blood chemistry: Secondary | ICD-10-CM | POA: Diagnosis not present

## 2020-02-15 DIAGNOSIS — Z7951 Long term (current) use of inhaled steroids: Secondary | ICD-10-CM

## 2020-02-15 DIAGNOSIS — Z832 Family history of diseases of the blood and blood-forming organs and certain disorders involving the immune mechanism: Secondary | ICD-10-CM

## 2020-02-15 DIAGNOSIS — D631 Anemia in chronic kidney disease: Secondary | ICD-10-CM | POA: Diagnosis present

## 2020-02-15 DIAGNOSIS — K219 Gastro-esophageal reflux disease without esophagitis: Secondary | ICD-10-CM | POA: Diagnosis present

## 2020-02-15 DIAGNOSIS — I272 Pulmonary hypertension, unspecified: Secondary | ICD-10-CM | POA: Diagnosis present

## 2020-02-15 DIAGNOSIS — Z96652 Presence of left artificial knee joint: Secondary | ICD-10-CM | POA: Diagnosis present

## 2020-02-15 DIAGNOSIS — R0602 Shortness of breath: Secondary | ICD-10-CM

## 2020-02-15 DIAGNOSIS — E876 Hypokalemia: Secondary | ICD-10-CM | POA: Diagnosis present

## 2020-02-15 DIAGNOSIS — Z8249 Family history of ischemic heart disease and other diseases of the circulatory system: Secondary | ICD-10-CM

## 2020-02-15 DIAGNOSIS — J9620 Acute and chronic respiratory failure, unspecified whether with hypoxia or hypercapnia: Secondary | ICD-10-CM

## 2020-02-15 DIAGNOSIS — Z87891 Personal history of nicotine dependence: Secondary | ICD-10-CM

## 2020-02-15 DIAGNOSIS — R778 Other specified abnormalities of plasma proteins: Secondary | ICD-10-CM | POA: Diagnosis present

## 2020-02-15 LAB — I-STAT VENOUS BLOOD GAS, ED
Acid-base deficit: 5 mmol/L — ABNORMAL HIGH (ref 0.0–2.0)
Bicarbonate: 21.3 mmol/L (ref 20.0–28.0)
Calcium, Ion: 1.02 mmol/L — ABNORMAL LOW (ref 1.15–1.40)
HCT: 35 % — ABNORMAL LOW (ref 36.0–46.0)
Hemoglobin: 11.9 g/dL — ABNORMAL LOW (ref 12.0–15.0)
O2 Saturation: 69 %
Potassium: 4.9 mmol/L (ref 3.5–5.1)
Sodium: 144 mmol/L (ref 135–145)
TCO2: 23 mmol/L (ref 22–32)
pCO2, Ven: 44.9 mmHg (ref 44.0–60.0)
pH, Ven: 7.284 (ref 7.250–7.430)
pO2, Ven: 41 mmHg (ref 32.0–45.0)

## 2020-02-15 LAB — LACTIC ACID, PLASMA
Lactic Acid, Venous: 1.5 mmol/L (ref 0.5–1.9)
Lactic Acid, Venous: 1.2 mmol/L (ref 0.5–1.9)

## 2020-02-15 LAB — BASIC METABOLIC PANEL
Anion gap: 15 (ref 5–15)
BUN: 81 mg/dL — ABNORMAL HIGH (ref 8–23)
CO2: 16 mmol/L — ABNORMAL LOW (ref 22–32)
Calcium: 7.5 mg/dL — ABNORMAL LOW (ref 8.9–10.3)
Chloride: 110 mmol/L (ref 98–111)
Creatinine, Ser: 6.05 mg/dL — ABNORMAL HIGH (ref 0.44–1.00)
GFR, Estimated: 7 mL/min — ABNORMAL LOW (ref >60)
Glucose, Bld: 271 mg/dL — ABNORMAL HIGH (ref 70–99)
Potassium: 5 mmol/L (ref 3.5–5.1)
Sodium: 141 mmol/L (ref 135–145)

## 2020-02-15 LAB — SARS CORONAVIRUS 2 BY RT PCR (HOSPITAL ORDER, PERFORMED IN ~~LOC~~ HOSPITAL LAB): SARS Coronavirus 2: POSITIVE — AB

## 2020-02-15 LAB — CBC
HCT: 37.3 % (ref 36.0–46.0)
Hemoglobin: 10.4 g/dL — ABNORMAL LOW (ref 12.0–15.0)
MCH: 20.4 pg — ABNORMAL LOW (ref 26.0–34.0)
MCHC: 27.9 g/dL — ABNORMAL LOW (ref 30.0–36.0)
MCV: 73.1 fL — ABNORMAL LOW (ref 80.0–100.0)
Platelets: 76 10*3/uL — ABNORMAL LOW (ref 150–400)
RBC: 5.1 MIL/uL (ref 3.87–5.11)
RDW: 21.5 % — ABNORMAL HIGH (ref 11.5–15.5)
WBC: 4.9 10*3/uL (ref 4.0–10.5)
nRBC: 0.4 % — ABNORMAL HIGH (ref 0.0–0.2)

## 2020-02-15 LAB — HEPATIC FUNCTION PANEL
ALT: 11 U/L (ref 0–44)
AST: 29 U/L (ref 15–41)
Albumin: 2.9 g/dL — ABNORMAL LOW (ref 3.5–5.0)
Alkaline Phosphatase: 58 U/L (ref 38–126)
Bilirubin, Direct: 0.4 mg/dL — ABNORMAL HIGH (ref 0.0–0.2)
Indirect Bilirubin: 0.2 mg/dL — ABNORMAL LOW (ref 0.3–0.9)
Total Bilirubin: 0.6 mg/dL (ref 0.3–1.2)
Total Protein: 7.7 g/dL (ref 6.5–8.1)

## 2020-02-15 LAB — TROPONIN I (HIGH SENSITIVITY)
Troponin I (High Sensitivity): 56 ng/L — ABNORMAL HIGH (ref <18)
Troponin I (High Sensitivity): 56 ng/L — ABNORMAL HIGH (ref <18)

## 2020-02-15 LAB — PROTIME-INR
INR: 1.2 (ref 0.8–1.2)
Prothrombin Time: 14.4 seconds (ref 11.4–15.2)

## 2020-02-15 LAB — BRAIN NATRIURETIC PEPTIDE: B Natriuretic Peptide: 73.8 pg/mL (ref 0.0–100.0)

## 2020-02-15 LAB — APTT: aPTT: 30 seconds (ref 24–36)

## 2020-02-15 LAB — MAGNESIUM: Magnesium: 1.7 mg/dL (ref 1.7–2.4)

## 2020-02-15 MED ORDER — ACETAMINOPHEN 500 MG PO TABS: 1000.0000 mg | ORAL_TABLET | Freq: Once | ORAL | Status: DC

## 2020-02-15 MED ORDER — SODIUM CHLORIDE 0.9 % IV SOLN
500.0000 mg | INTRAVENOUS | Status: AC
  Administered 2020-02-15 – 2020-02-21 (×7): 500 mg via INTRAVENOUS
  Filled 2020-02-15 (×7): qty 500

## 2020-02-15 MED ORDER — DEXAMETHASONE SODIUM PHOSPHATE 10 MG/ML IJ SOLN
6.0000 mg | Freq: Once | INTRAMUSCULAR | Status: AC
  Administered 2020-02-15: 6 mg via INTRAVENOUS
  Filled 2020-02-15: qty 1

## 2020-02-15 MED ORDER — SODIUM CHLORIDE 0.9 % IV SOLN
100.0000 mg | Freq: Every day | INTRAVENOUS | Status: AC
  Administered 2020-02-16 – 2020-02-19 (×4): 100 mg via INTRAVENOUS
  Filled 2020-02-15: qty 100
  Filled 2020-02-15 (×3): qty 20

## 2020-02-15 MED ORDER — SODIUM CHLORIDE 0.9 % IV SOLN
2.0000 g | INTRAVENOUS | Status: AC
  Administered 2020-02-15 – 2020-02-21 (×7): 2 g via INTRAVENOUS
  Filled 2020-02-15: qty 2
  Filled 2020-02-15 (×6): qty 20

## 2020-02-15 MED ORDER — ACETAMINOPHEN 500 MG PO TABS
1000.0000 mg | ORAL_TABLET | Freq: Once | ORAL | Status: AC
  Administered 2020-02-15: 1000 mg via ORAL
  Filled 2020-02-15: qty 2

## 2020-02-15 MED ORDER — ACETAMINOPHEN 650 MG RE SUPP: 650.0000 mg | Freq: Once | RECTAL | Status: DC

## 2020-02-15 MED ORDER — SODIUM CHLORIDE 0.9 % IV SOLN
200.0000 mg | Freq: Once | INTRAVENOUS | Status: AC
  Administered 2020-02-16: 200 mg via INTRAVENOUS
  Filled 2020-02-15: qty 40

## 2020-02-15 NOTE — ED Notes (Signed)
Respiratory at bedside placing bipap

## 2020-02-15 NOTE — ED Provider Notes (Signed)
[facility] EMERGENCY DEPARTMENT Provider Note   CSN: [id number] Arrival date & time: 02/15/20  2037     History Chief Complaint  Patient presents with  . Shortness of Breath  . Weakness    Joann Gutierrez is a 75 y.o. female with a hx of chronic respiratory failure on 2L via [place] @ baseline, CHF last EF 65-70%, pulmonary hypertension, CKD, DM, COPD, anemia, hyperlipidemia, sleep apnea on CPAP @ night, & anemia who presents to the emergency department via EMS for progressively worsening shortness of breath x 6 days.  History provided by EMS, they state that patient has had progressively worsening shortness of breath- especially with activity, she has had associated cough, fatigue, and generalized weakness.  Patient able to answer yes or no questions on CPAP- confirms EMS history, she states that she is having some chest pain that is worse with coughing.  No other alleviating or rating factors.  Per EMS on their arrival she was on her CPAP machine which seemed to have a poor seal but she was saturating in the 60s.  She was transitioned to their CPAP machine with improvement.  She denies fever. Level 5 caveat secondary to acuity of condition.   HPI     Past Medical History:  Diagnosis Date  . Adenomatous colon polyp   . Allergy   . Anemia   . Asthma       . CAD (coronary artery disease)    Mild very minimal coronary disease with 20% obtuse marginal stenosis  . Carpal tunnel syndrome on left   . Cataract   . CHF (congestive heart failure) ([place])   . Chronic kidney disease (CKD), stage III (moderate) (HCC)    now stage 4  . COPD (chronic obstructive pulmonary disease) ([place])   . Depression   . 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 Diverticulosis   . Dyspnea   . 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 Esophageal dysmotility   . Esophageal stricture   . Gastritis   . Gastroparesis 08/21/2007  . GERD (gastroesophageal reflux disease)   . Hearing loss  of both ears   . Hernia, hiatal   . Hyperlipidemia   . Hypertension   . Morbid obesity ([place])   . OSTEOARTHRITIS 08/09/2006  . Osteoporosis   . Oxygen deficiency    prn   . 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 RENAL INSUFFICIENCY 02/16/2009  . 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 Seizures ([place])    pt thinks it has been several monthes since she had a seisure  . Shingles   . Sickle cell trait ([place])   . Sleep apnea    uses cpap  . Stroke [facility])     Patient Active Problem List   Diagnosis Date Noted  . Acute on chronic diastolic HF (heart failure) ([place]) 11/18/2019  . Dyslipidemia 11/18/2019  . Polypharmacy 04/13/2019  . Healthcare maintenance 10/15/2017  . ARF (acute renal failure) ([place]) 09/07/2017  . Chest pain 09/05/2017  . Ineffective health maintenance 06/26/2017  . Medication management 06/26/2017  . Retinal edema 10/07/2016  . Postherpetic neuralgia 12/21/2015  . Herpes zoster with complication 36/64/4034  . Cellulitis of face 11/27/2015  . Anemia of chronic disease 08/17/2015  . History of colonic polyps   . Hypertensive emergency 05/20/2015  . Hypokalemia 05/20/2015  . AKI (acute kidney injury) ([place])   . Esophageal dysmotility 03/01/2015  . Throat congestion 10/11/2014  . Morbid obesity due  to excess calories ([place]) 09/23/2014  . Acute asthmatic bronchitis 05/20/2014  . Abdominal pain, other specified site 09/13/2013  . Elevated liver enzymes 09/13/2013  . Cough 05/05/2013  . Pain in lower limb 03/05/2013  . DOE (dyspnea on exertion) 02/03/2013  . Chronic respiratory failure ([place]) 12/13/2012  . Stricture and stenosis of esophagus 08/31/2012  . Onychomycosis 06/01/2012  . Pain in joint, ankle and foot 06/01/2012  . Insulin dependent diabetes mellitus 04/29/2012  . Essential hypertension 07/19/2011  . CKD (chronic kidney disease), stage III ([place]) 05/19/2011  . Family history of malignant neoplasm of gastrointestinal tract 10/30/2010  . Bloating  10/16/2010  . Epigastric pain 10/16/2010  . Foot pain 09/25/2010  . Dysphagia 07/16/2010  . GERD (gastroesophageal reflux disease) 07/16/2010  . Microcytic anemia 07/16/2010  . Obstructive sleep apnea 06/16/2009  . HYPERCHOLESTEROLEMIA 02/16/2009  . 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 Gastroparesis 08/21/2007  . Chronic obstructive asthma ([place]) 08/09/2006    Past Surgical History:  Procedure Laterality Date  . ABDOMINAL HYSTERECTOMY    . ARTERY BIOPSY  01/07/2011   Procedure: MINOR BIOPSY TEMPORAL ARTERY;  Surgeon: Haywood Lasso, MD;  Location: [place];  Service: General;  Laterality: Left;  left temporal artery biopsy  . AV FISTULA PLACEMENT Right 11/16/2018   Procedure: CREATION RIGHT BRACHIOCEPHALIC FISTULA  ARTERIOVENOUS FISTULA;  Surgeon: Angelia Mould, MD;  Location: [place];  Service: Vascular;  Laterality: Right;  . bil foot surgery    . BREAST LUMPECTOMY     benign  . BREAST LUMPECTOMY     both breast lumps removed   . CATARACT EXTRACTION Left 06/2016   Dr. Read Drivers  . COLONOSCOPY    . COLONOSCOPY WITH PROPOFOL N/A 07/05/2015   Procedure: COLONOSCOPY WITH PROPOFOL;  Surgeon: Manus Gunning, MD;  Location: [facility];  Service: Gastroenterology;  Laterality: N/A;  . COLONOSCOPY WITH PROPOFOL N/A 08/04/2018   Procedure: COLONOSCOPY WITH PROPOFOL;  Surgeon: Yetta Flock, MD;  Location: [facility];  Service: Gastroenterology;  Laterality: N/A;  . ERD  08/08/2000  . ESOPHAGEAL MANOMETRY N/A 03/13/2015   Procedure: ESOPHAGEAL MANOMETRY (EM);  Surgeon: Manus Gunning, MD;  Location: [facility];  Service: Gastroenterology;  Laterality: N/A;  . ESOPHAGOGASTRODUODENOSCOPY  06/25/2006  . ESOPHAGOGASTRODUODENOSCOPY (EGD) WITH PROPOFOL N/A 07/05/2015   Procedure: ESOPHAGOGASTRODUODENOSCOPY (EGD) WITH PROPOFOL;  Surgeon: Manus Gunning, MD;  Location: [facility];  Service: Gastroenterology;  Laterality: N/A;  .  FISTULA SUPERFICIALIZATION Right 11/16/2018   Procedure: Fistula Superficialization;  Surgeon: Angelia Mould, MD;  Location: [place];  Service: Vascular;  Laterality: Right;  . HERNIA REPAIR    . LEFT AND RIGHT HEART CATHETERIZATION WITH CORONARY ANGIOGRAM N/A 03/03/2013   Procedure: LEFT AND RIGHT HEART CATHETERIZATION WITH CORONARY ANGIOGRAM;  Surgeon: Minus Breeding, MD;  Location: [facility] C[facility] LAB;  Service: Cardiovascular;  Laterality: N/A;  . REPLACEMENT TOTAL KNEE Left 1998  . UPPER GASTROINTESTINAL ENDOSCOPY       OB History   No obstetric history on file.     Family History  Problem Relation Age of Onset  . Liver cancer Mother        Liver Cancer  . Diabetes Mother   . Kidney disease Mother   . Heart disease Mother        age 56's  . Heart disease Father 74       MI  . Heart attack Father        died of MI when pt  was 5  . Sickle cell anemia Father   . Colon cancer Brother   . Cancer Brother        Colon Cancer  . Diabetes Sister   . Kidney disease Sister   . Heart disease Sister        age 48's  . Allergies Sister   . Diabetes Sister   . Kidney disease Sister   . Heart disease Sister        age 68's  . Esophageal cancer Neg Hx   . Rectal cancer Neg Hx   . Stomach cancer Neg Hx   . Amblyopia Neg Hx   . Blindness Neg Hx   . Glaucoma Neg Hx   . Macular degeneration Neg Hx   . Retinal detachment Neg Hx   . Cataracts Neg Hx   . Strabismus Neg Hx   . Retinitis pigmentosa Neg Hx     Social History   Tobacco Use  . Smoking status: Former Smoker    Packs/day: 0.50    Years: 10.00    Pack years: 5.00    Quit date: 04/18/1980    Years since quitting: 39.8  . Smokeless tobacco: Never Used  Vaping Use  . Vaping Use: Never used  Substance Use Topics  . Alcohol use: No    Alcohol/week: 0.0 standard drinks  . Drug use: No    Home Medications Prior to Admission medications   Medication Sig Start Date End Date Taking? Authorizing Provider  albuterol  (VENTOLIN HFA) 108 (90 Base) MCG/ACT inhaler Inhale 2 puffs into the lungs every 6 (six) hours as needed for wheezing or shortness of breath. 11/15/19   Tanda Rockers, MD  amLODipine (NORVASC) 10 MG tablet Take 10 mg by mouth every evening. 01/09/19   [provider]  amLODipine (NORVASC) 2.5 MG tablet  07/21/19   [provider]  aspirin EC 81 MG tablet Take 81 mg by mouth daily.    [provider]  azelastine (ASTELIN) 0.1 % nasal spray Place 1 spray into both nostrils 2 (two) times daily as needed (allergies). Use in each nostril as directed 11/10/19   [name], MD  calcium carbonate (OSCAL) 1500 (600 Ca) MG TABS tablet Take 1 tablet by mouth daily.    [provider]  carvedilol (COREG) 25 MG tablet Take 25 mg by mouth 2 (two) times daily. 01/09/19   [provider]  donepezil (ARICEPT) 10 MG tablet Take 10 mg by mouth at bedtime.    [provider]  ezetimibe (ZETIA) 10 MG tablet Take 10 mg by mouth daily.    [provider]  ferrous sulfate 325 (65 FE) MG tablet Take 325 mg by mouth daily.    [provider]  Fluticasone-Umeclidin-Vilant (TRELEGY ELLIPTA) 100-62.5-25 MCG/INH AEPB Take 1 puff by mouth daily. 04/13/19   Lauraine Rinne, NP  furosemide (LASIX) 80 MG tablet Take 160 mg by mouth 2 (two) times daily.     [provider]  gabapentin (NEURONTIN) 100 MG capsule Take 1 capsule (100 mg total) by mouth 2 (two) times daily. Patient taking differently: Take 100 mg by mouth at bedtime. 09/12/17   Hongalgi, Lenis Dickinson, MD  hydrALAZINE (APRESOLINE) 100 MG tablet Take 1 tablet by mouth 3 (three) times daily. 05/10/19   [provider]  ketorolac (ACULAR) 0.4 % SOLN Place 1 drop into the left eye 2 (two) times a day.    [provider]  LEVEMIR FLEXTOUCH 100 UNIT/ML Pen  Inject 18 Units into the skin daily. Patient taking differently: Inject 30 Units into the skin daily. 09/13/17   Hongalgi, Lenis Dickinson, MD   losartan (COZAAR) 100 MG tablet Take 100 mg by mouth daily.    [provider]  Misc Natural Products (NEURIVA PO) Take 1 capsule by mouth daily.    [provider]  montelukast (SINGULAIR) 10 MG tablet Take 1 tablet (10 mg total) by mouth daily. Patient taking differently: Take 10 mg by mouth at bedtime. 10/22/17   Lauraine Rinne, NP  nitroGLYCERIN (NITROSTAT) 0.4 MG SL tablet PLACE ONE TABLET UNDER THE TONGUE EVERY FIVE MINUTES AS NEEDED FOR CHEST PAIN 07/08/16   Minus Breeding, MD  NOVOLOG FLEXPEN 100 UNIT/ML FlexPen INJECT 5 UNITS SUBCUTANEOUSLY 3 TIMES DAILY AS DIRECTED SLIDING SCALE 06/16/18   [provider]  omeprazole (PRILOSEC) 40 MG capsule Take 1 capsule (40 mg total) by mouth in the morning and at bedtime. Please schedule a yearly follow up: [phone]. Thank you 09/01/19   Yetta Flock, MD  OXYGEN Inhale 2 L into the lungs as needed. CPAP with oxygen at bedtime    [provider]  polyethylene glycol (MIRALAX) 17 g packet Take 17 g by mouth daily as needed. Patient taking differently: Take 17 g by mouth daily as needed (constipation.). 06/18/18   Armbruster, Carlota Raspberry, MD  promethazine (PHENERGAN) 12.5 MG tablet Take 1 tablet by mouth daily. 05/06/19   [provider]  raloxifene (EVISTA) 60 MG tablet Take 60 mg by mouth every evening.    [provider]  spironolactone (ALDACTONE) 25 MG tablet Take 25 mg by mouth daily. 01/06/19   [provider]  TRUEPLUS PEN NEEDLES 32G X 4 MM MISC INJECT THREE TIMES A DAY AS DIRECTED 07/08/17   [provider]  Vitamin D, Ergocalciferol, (DRISDOL) 50000 UNITS CAPS capsule Take 50,000 Units by mouth every Friday.     [provider]    Allergies    Morphine and related and Promethazine hcl  Review of Systems   Review of Systems  Unable to perform ROS: Acuity of condition  Constitutional: Negative for fever.  Respiratory: Positive for cough and shortness of  breath.   Cardiovascular: Positive for chest pain.  All other systems reviewed and are negative.   Physical Exam Updated Vital Signs BP (!) 153/60   Pulse (!) 106   Temp (!) 101.8 F (38.8 C) (Oral)   Resp (!) 32   SpO2 95%   Physical Exam Vitals and nursing note reviewed.  Constitutional:      Appearance: She is ill-appearing.  HENT:     Head: Normocephalic and atraumatic.     Nose: Congestion and rhinorrhea present.  Eyes:     Pupils: Pupils are equal, round, and reactive to light.  Cardiovascular:     Rate and Rhythm: Regular rhythm. Tachycardia present.     Pulses:          Dorsalis pedis pulses are 2+ on the right side and 2+ on the left side.  Pulmonary:     Effort: Tachypnea present.     Breath sounds: Rales (bibasilar, R>L) present.     Comments: On CPAP. Tachypneic. Able to answer yes/no questions.  Chest:     Chest wall: Tenderness (mild anterior chest wall) present.  Abdominal:     Tenderness: There is no abdominal tenderness. There is no guarding or rebound.  Musculoskeletal:     Cervical back: No rigidity.  Comments: RUE upper arm mild palpable thrill.  Bilateral lower leg swelling L>R, L foot pedal edema present.   Skin:    General: Skin is dry.  Neurological:     Mental Status: She is alert.  Psychiatric:        Mood and Affect: Affect normal.        Behavior: Behavior is cooperative.    ED Results / Procedures / Treatments   Labs (all labs ordered are listed, but only abnormal results are displayed) Labs Reviewed  SARS CORONAVIRUS 2 BY RT PCR (HOSPITAL ORDER, [place] [facility]) - Abnormal; Notable for the following components:      Result Value   SARS Coronavirus 2 POSITIVE (*)    All other components within normal limits  BASIC METABOLIC PANEL - Abnormal; Notable for the following components:   CO2 16 (*)    Glucose, Bld 271 (*)    BUN 81 (*)    Creatinine, Ser 6.05 (*)    Calcium 7.5 (*)    GFR, Estimated 7 (*)     All other components within normal limits  CBC - Abnormal; Notable for the following components:   Hemoglobin 10.4 (*)    MCV 73.1 (*)    MCH 20.4 (*)    MCHC 27.9 (*)    RDW 21.5 (*)    Platelets 76 (*)    nRBC 0.4 (*)    All other components within normal limits  HEPATIC FUNCTION PANEL - Abnormal; Notable for the following components:   Albumin 2.9 (*)    Bilirubin, Direct 0.4 (*)    Indirect Bilirubin 0.2 (*)    All other components within normal limits  I-STAT VENOUS BLOOD GAS, ED - Abnormal; Notable for the following components:   Acid-base deficit 5.0 (*)    Calcium, Ion 1.02 (*)    HCT 35.0 (*)    Hemoglobin 11.9 (*)    All other components within normal limits  TROPONIN I (HIGH SENSITIVITY) - Abnormal; Notable for the following components:   Troponin I (High Sensitivity) 56 (*)    All other components within normal limits  TROPONIN I (HIGH SENSITIVITY) - Abnormal; Notable for the following components:   Troponin I (High Sensitivity) 56 (*)    All other components within normal limits  CULTURE, BLOOD (ROUTINE X 2)  CULTURE, BLOOD (ROUTINE X 2)  URINE CULTURE  PROTIME-INR  BRAIN NATRIURETIC PEPTIDE  MAGNESIUM  LACTIC ACID, PLASMA  LACTIC ACID, PLASMA  APTT  BLOOD GAS, VENOUS  URINALYSIS, ROUTINE W REFLEX MICROSCOPIC  D-DIMER, QUANTITATIVE (NOT AT [facility])  PROCALCITONIN  LACTATE DEHYDROGENASE  FERRITIN  TRIGLYCERIDES  FIBRINOGEN  C-REACTIVE PROTEIN    EKG EKG Interpretation  Date/Time:  Tuesday February 15 2020 20:53:54 EST Ventricular Rate:  102 PR Interval:    QRS Duration: 75 QT Interval:  336 QTC Calculation: 438 R Axis:   28 Text Interpretation: Sinus tachycardia Probable left atrial enlargement Probable anteroseptal infarct, old Confirmed by Madalyn Rob [phone]) on 02/15/2020 8:55:30 PM   Radiology DG Chest Portable 1 View  Result Date: 02/15/2020 CLINICAL DATA:  75 year old female with shortness of breath. EXAM: PORTABLE CHEST 1 VIEW  COMPARISON:  Chest radiograph dated 11/20/2017 and 10/06/2017. FINDINGS: Bilateral streaky and confluent densities, right greater left, most consistent with developing infiltrate, possibly viral or atypical in etiology. Clinical correlation is recommended. No large pleural effusion pneumothorax. Mild cardiomegaly. No acute osseous pathology. IMPRESSION: Findings most consistent with developing infiltrate predominantly involving the right  lung. Electronically Signed   By: Anner Crete M.D.   On: 02/15/2020 21:02    Procedures .Critical Care Performed by: Amaryllis Dyke, PA-C Authorized by: Amaryllis Dyke, PA-C    CRITICAL CARE Performed by: Kennith Maes   Total critical care time: 55 minutes  Critical care time was exclusive of separately billable procedures and treating other patients.  Critical care was necessary to treat or prevent imminent or life-threatening deterioration.  Critical care was time spent personally by me on the following activities: development of treatment plan with patient and/or surrogate as well as nursing, discussions with consultants, evaluation of patient's response to treatment, examination of patient, obtaining history from patient or surrogate, ordering and performing treatments and interventions, ordering and review of laboratory studies, ordering and review of radiographic studies, pulse oximetry and re-evaluation of patient's condition.  Medications Ordered in ED Medications  cefTRIAXone (ROCEPHIN) 2 g in sodium chloride 0.9 % 100 mL IVPB (0 g Intravenous Stopped 02/15/20 2314)  azithromycin (ZITHROMAX) 500 mg in sodium chloride 0.9 % 250 mL IVPB (500 mg Intravenous New Bag/Given 02/15/20 2317)  remdesivir 200 mg in sodium chloride 0.9% 250 mL IVPB (has no administration in time range)    Followed by  remdesivir 100 mg in sodium chloride 0.9 % 100 mL IVPB (has no administration in time range)  acetaminophen (TYLENOL) tablet 1,000 mg  (1,000 mg Oral Given 02/15/20 2230)  dexamethasone (DECADRON) injection 6 mg (6 mg Intravenous Given 02/15/20 2317)    ED Course  I have reviewed the triage vital signs and the nursing notes.  Pertinent labs & imaging results that were available during my care of the patient were reviewed by me and considered in my medical decision making (see chart for details).  Joann Gutierrez was evaluated in Emergency Department on 02/15/2020 for the symptoms described in the history of present illness. He/she was evaluated in the context of the global COVID-19 pandemic, which necessitated consideration that the patient might be at risk for infection with the SARS-CoV-2 virus that causes COVID-19. Institutional protocols and algorithms that pertain to the evaluation of patients at risk for COVID-19 are in a state of rapid change based on information released by regulatory bodies including the CDC and federal and state organizations. These policies and algorithms were followed during the patient's care in the ED.    MDM Rules/Calculators/A&P                         Patient presents to the ED with complaints of dyspnea.  On CPAP on arrival with increased work of breathing, saturating well, able to answer yes/no questions.  Noted to be febrile & tachycardic.  Transitioned to hospital BIPAP.   DDX: CAP, COVID, acute CHF, acute COPD (though no wheezing), pulmonary embolism/DVT, ACS, pneumothorax, critical anemia, arrhythmia.   Additional history obtained:  Additional history obtained from EMS, chart review, & nursing note review.   EKG: Sinus tachycardia Probable left atrial enlargement Probable anteroseptal infarct, old Imaging Studies ordered:  I ordered imaging studies which included CXR, I independently visualized and interpreted imaging which showed Findings most consistent with developing infiltrate predominantly involving the right lung.   21:04: Findings of pneumonia on CXR, meets SIRS criteria,  code sepsis activated with abx for CAP, though remain with suspicion for COVID. BP elevated, not hypotensive- no 30 cc/kg bolus ordered at this time- will continue to monitor.   Lab Tests:  I Ordered, reviewed, and interpreted  labs, which included:  VBG: Fairly unremarkable. No acidosis.  CBC: No leukocytosis. Anemia improved from prior.  BMP: Acute kidney injury- creatinine typically 4-4.5, today is 6.05 BUN 81. Hyperglycemic. Bicarb 16. No anion gap elevation.  Hepatic function panel:  Mg: WNL Troponin: Elevated some, suspect demand.  BNP: WNL Lactic acid: WNL APTT/PT/INR: WNL  COVID 19: Patient w/ acute on chronic respiratory failure in the setting of COVID 19- temporarily required BIPAP, transitioned to 5L via [place] with SpO2 95% and RR 20-22 breaths per minute when re-evaluated @ 22:50. Remdesivir & decadron ordered.   AKI: Patient w/ CKD, has graft placed by vascular, still urinating at home, AKI today. Discussed with nephrologist Dr. Justin Mend @ 22:51- does not need emergent dialysis, nephrology team will see tomorrow in consultation, appreciate input.   Patient also does appear to have somewhat asymmetric swelling to her LEs- L>R, 2+ symmetric DP pulses, no overlying erythema/wounds- does not appear consistent w/ cellulitis. Will order venous duplex of the LLE for patient to have to further assess for DVT. Suspect her respiratory presentation is secondary to COVID, additionally could not have CTA performed to evaluate for PE with her renal function.   Plan for admission. Patient & her daughter updated on results & plan of care- in agreement.  Findings and plan of care discussed with supervising physician Dr. Roslynn Amble who has evaluated patient as shared visit & is in agreement.   23:05: CONSULT: Discussed with hospitalist Dr. Marlowe Sax- accepts admission.   23:59: Called back & re-discussed possible initiation of anticoagulation with Dr. Marlowe Sax given concern for LLE DVT and no vascular  ultrasound overnight - recommends starting heparin, she will follow up on D-dimer and manage as indicated. Heparin ordered.   Portions of this note were generated with Lobby[employer]. Dictation errors may occur despite best attempts at proofreading.  Final Clinical Impression(s) / ED Diagnoses Final diagnoses:  Acute hypoxemic respiratory failure ([place])  AKI (acute kidney injury) ([place])  COVID-19    Rx / DC Orders ED Discharge Orders    None       Amaryllis Dyke, PA-C 02/16/20 0003    Lucrezia Starch, MD 02/16/20 1605    Lucrezia Starch, MD 02/16/20 1606

## 2020-02-15 NOTE — ED Triage Notes (Addendum)
Per [facility] pt coming from home with respiratory distress, since Thursday having increased sob, normally able to stand and ambulate, pt now weak un able to walk. Rales in lower lobes. chf history, pitting edema and weeping bilaterally to legs. Pt was on cpap when ems arrived but 67-70% on home cpap. On ems cpap 93-95%. Initial bp 180/100 sinus tach 106, CBG 134, now 140/100. Aao2, hx dementia.

## 2020-02-16 ENCOUNTER — Inpatient Hospital Stay (HOSPITAL_COMMUNITY): Payer: Medicare HMO

## 2020-02-16 DIAGNOSIS — R7989 Other specified abnormal findings of blood chemistry: Secondary | ICD-10-CM | POA: Diagnosis not present

## 2020-02-16 DIAGNOSIS — J9621 Acute and chronic respiratory failure with hypoxia: Secondary | ICD-10-CM

## 2020-02-16 DIAGNOSIS — U071 COVID-19: Secondary | ICD-10-CM

## 2020-02-16 DIAGNOSIS — N179 Acute kidney failure, unspecified: Secondary | ICD-10-CM | POA: Diagnosis not present

## 2020-02-16 DIAGNOSIS — J1282 Pneumonia due to coronavirus disease 2019: Secondary | ICD-10-CM

## 2020-02-16 DIAGNOSIS — J9620 Acute and chronic respiratory failure, unspecified whether with hypoxia or hypercapnia: Secondary | ICD-10-CM

## 2020-02-16 DIAGNOSIS — R600 Localized edema: Secondary | ICD-10-CM

## 2020-02-16 DIAGNOSIS — A419 Sepsis, unspecified organism: Secondary | ICD-10-CM

## 2020-02-16 LAB — FERRITIN: Ferritin: 919 ng/mL — ABNORMAL HIGH (ref 11–307)

## 2020-02-16 LAB — URINALYSIS, ROUTINE W REFLEX MICROSCOPIC
Bilirubin Urine: NEGATIVE
Glucose, UA: NEGATIVE mg/dL
Ketones, ur: NEGATIVE mg/dL
Nitrite: NEGATIVE
Protein, ur: >=300 mg/dL — AB
Specific Gravity, Urine: 1.015 (ref 1.005–1.030)
WBC, UA: >50 WBC/hpf — ABNORMAL HIGH (ref 0–5)
pH: 5 (ref 5.0–8.0)

## 2020-02-16 LAB — PROCALCITONIN
Procalcitonin: 1.63 ng/mL
Procalcitonin: 1.83 ng/mL

## 2020-02-16 LAB — LACTATE DEHYDROGENASE: LDH: 315 U/L — ABNORMAL HIGH (ref 98–192)

## 2020-02-16 LAB — CREATININE, URINE, RANDOM: Creatinine, Urine: 201.29 mg/dL

## 2020-02-16 LAB — RENAL FUNCTION PANEL
Albumin: 2.7 g/dL — ABNORMAL LOW (ref 3.5–5.0)
Anion gap: 14 (ref 5–15)
BUN: 83 mg/dL — ABNORMAL HIGH (ref 8–23)
CO2: 21 mmol/L — ABNORMAL LOW (ref 22–32)
Calcium: 7.6 mg/dL — ABNORMAL LOW (ref 8.9–10.3)
Chloride: 109 mmol/L (ref 98–111)
Creatinine, Ser: 5.83 mg/dL — ABNORMAL HIGH (ref 0.44–1.00)
GFR, Estimated: 7 mL/min — ABNORMAL LOW (ref >60)
Glucose, Bld: 283 mg/dL — ABNORMAL HIGH (ref 70–99)
Phosphorus: 5.1 mg/dL — ABNORMAL HIGH (ref 2.5–4.6)
Potassium: 4.8 mmol/L (ref 3.5–5.1)
Sodium: 144 mmol/L (ref 135–145)

## 2020-02-16 LAB — D-DIMER, QUANTITATIVE: D-Dimer, Quant: 2.95 ug/mL-FEU — ABNORMAL HIGH (ref 0.00–0.50)

## 2020-02-16 LAB — I-STAT VENOUS BLOOD GAS, ED
Acid-base deficit: 7 mmol/L — ABNORMAL HIGH (ref 0.0–2.0)
Bicarbonate: 20.1 mmol/L (ref 20.0–28.0)
Calcium, Ion: 0.96 mmol/L — ABNORMAL LOW (ref 1.15–1.40)
HCT: 30 % — ABNORMAL LOW (ref 36.0–46.0)
Hemoglobin: 10.2 g/dL — ABNORMAL LOW (ref 12.0–15.0)
O2 Saturation: 84 %
Potassium: 5.2 mmol/L — ABNORMAL HIGH (ref 3.5–5.1)
Sodium: 142 mmol/L (ref 135–145)
TCO2: 21 mmol/L — ABNORMAL LOW (ref 22–32)
pCO2, Ven: 44.2 mmHg (ref 44.0–60.0)
pH, Ven: 7.267 (ref 7.250–7.430)
pO2, Ven: 55 mmHg — ABNORMAL HIGH (ref 32.0–45.0)

## 2020-02-16 LAB — TSH: TSH: 0.758 u[IU]/mL (ref 0.350–4.500)

## 2020-02-16 LAB — GLUCOSE, CAPILLARY
Glucose-Capillary: 246 mg/dL — ABNORMAL HIGH (ref 70–99)
Glucose-Capillary: 247 mg/dL — ABNORMAL HIGH (ref 70–99)

## 2020-02-16 LAB — HEMOGLOBIN A1C
Hgb A1c MFr Bld: 7.4 % — ABNORMAL HIGH (ref 4.8–5.6)
Mean Plasma Glucose: 165.68 mg/dL

## 2020-02-16 LAB — VITAMIN B12: Vitamin B-12: 567 pg/mL (ref 180–914)

## 2020-02-16 LAB — CBC
HCT: 33.9 % — ABNORMAL LOW (ref 36.0–46.0)
Hemoglobin: 10.3 g/dL — ABNORMAL LOW (ref 12.0–15.0)
MCH: 21.1 pg — ABNORMAL LOW (ref 26.0–34.0)
MCHC: 30.4 g/dL (ref 30.0–36.0)
MCV: 69.3 fL — ABNORMAL LOW (ref 80.0–100.0)
Platelets: 84 10*3/uL — ABNORMAL LOW (ref 150–400)
RBC: 4.89 MIL/uL (ref 3.87–5.11)
RDW: 21.3 % — ABNORMAL HIGH (ref 11.5–15.5)
WBC: 4.6 10*3/uL (ref 4.0–10.5)
nRBC: 0 % (ref 0.0–0.2)

## 2020-02-16 LAB — CBG MONITORING, ED
Glucose-Capillary: 321 mg/dL — ABNORMAL HIGH (ref 70–99)
Glucose-Capillary: 251 mg/dL — ABNORMAL HIGH (ref 70–99)

## 2020-02-16 LAB — SODIUM, URINE, RANDOM: Sodium, Ur: 52 mmol/L

## 2020-02-16 LAB — TRIGLYCERIDES: Triglycerides: 158 mg/dL — ABNORMAL HIGH (ref <150)

## 2020-02-16 LAB — AMMONIA: Ammonia: 21 umol/L (ref 9–35)

## 2020-02-16 LAB — HEPATITIS B SURFACE ANTIGEN: Hepatitis B Surface Ag: NONREACTIVE

## 2020-02-16 LAB — C-REACTIVE PROTEIN: CRP: 17.3 mg/dL — ABNORMAL HIGH (ref <1.0)

## 2020-02-16 LAB — HEPATITIS B CORE ANTIBODY, TOTAL: Hep B Core Total Ab: NONREACTIVE

## 2020-02-16 LAB — HEPATITIS B SURFACE ANTIBODY,QUALITATIVE: Hep B S Ab: REACTIVE — AB

## 2020-02-16 LAB — FIBRINOGEN: Fibrinogen: 592 mg/dL — ABNORMAL HIGH (ref 210–475)

## 2020-02-16 LAB — HEPARIN LEVEL (UNFRACTIONATED): Heparin Unfractionated: 0.72 IU/mL — ABNORMAL HIGH (ref 0.30–0.70)

## 2020-02-16 MED ORDER — CARVEDILOL 25 MG PO TABS
25.0000 mg | ORAL_TABLET | Freq: Two times a day (BID) | ORAL | Status: DC
  Administered 2020-02-16 – 2020-03-02 (×22): 25 mg via ORAL
  Filled 2020-02-16 (×24): qty 1

## 2020-02-16 MED ORDER — IPRATROPIUM-ALBUTEROL 20-100 MCG/ACT IN AERS: 1.0000 | INHALATION_SPRAY | Freq: Four times a day (QID) | RESPIRATORY_TRACT | Status: DC | PRN

## 2020-02-16 MED ORDER — HEPARIN SODIUM (PORCINE) 1000 UNIT/ML DIALYSIS
1000.0000 [IU] | INTRAMUSCULAR | Status: DC | PRN
  Filled 2020-02-16: qty 1

## 2020-02-16 MED ORDER — HYDRALAZINE HCL 50 MG PO TABS
100.0000 mg | ORAL_TABLET | Freq: Three times a day (TID) | ORAL | Status: DC
  Administered 2020-02-16 – 2020-03-02 (×32): 100 mg via ORAL
  Filled 2020-02-16 (×36): qty 2

## 2020-02-16 MED ORDER — PREDNISONE 5 MG PO TABS
50.0000 mg | ORAL_TABLET | Freq: Every day | ORAL | Status: DC
  Administered 2020-02-19 – 2020-02-24 (×6): 50 mg via ORAL
  Filled 2020-02-16 (×6): qty 2

## 2020-02-16 MED ORDER — SODIUM BICARBONATE 8.4 % IV SOLN
50.0000 meq | Freq: Once | INTRAVENOUS | Status: AC
  Administered 2020-02-16: 50 meq via INTRAVENOUS
  Filled 2020-02-16: qty 50

## 2020-02-16 MED ORDER — METHYLPREDNISOLONE SODIUM SUCC 125 MG IJ SOLR
0.5000 mg/kg | Freq: Two times a day (BID) | INTRAMUSCULAR | Status: AC
  Administered 2020-02-16 – 2020-02-18 (×6): 58.125 mg via INTRAVENOUS
  Filled 2020-02-16 (×6): qty 2

## 2020-02-16 MED ORDER — ASPIRIN EC 81 MG PO TBEC
81.0000 mg | DELAYED_RELEASE_TABLET | Freq: Every day | ORAL | Status: DC
Start: 2020-02-16 — End: 2020-03-03
  Administered 2020-02-16 – 2020-03-02 (×16): 81 mg via ORAL
  Filled 2020-02-16 (×16): qty 1

## 2020-02-16 MED ORDER — VITAMIN D 25 MCG (1000 UNIT) PO TABS
1000.0000 [IU] | ORAL_TABLET | Freq: Every day | ORAL | Status: DC
  Administered 2020-02-16 – 2020-03-02 (×16): 1000 [IU] via ORAL
  Filled 2020-02-16 (×16): qty 1

## 2020-02-16 MED ORDER — SODIUM CHLORIDE 0.9 % IV SOLN: 100.0000 mL | INTRAVENOUS | Status: DC | PRN

## 2020-02-16 MED ORDER — ALTEPLASE 2 MG IJ SOLR: 2.0000 mg | Freq: Once | INTRAMUSCULAR | Status: DC | PRN

## 2020-02-16 MED ORDER — INSULIN DETEMIR 100 UNIT/ML ~~LOC~~ SOLN
18.0000 [IU] | Freq: Every day | SUBCUTANEOUS | Status: DC
  Administered 2020-02-16 – 2020-02-21 (×5): 18 [IU] via SUBCUTANEOUS
  Filled 2020-02-16 (×7): qty 0.18

## 2020-02-16 MED ORDER — LIDOCAINE HCL (PF) 1 % IJ SOLN: 5.0000 mL | INTRAMUSCULAR | Status: DC | PRN

## 2020-02-16 MED ORDER — CHLORHEXIDINE GLUCONATE CLOTH 2 % EX PADS
6.0000 | MEDICATED_PAD | Freq: Every day | CUTANEOUS | Status: DC
  Administered 2020-02-17: 6 via TOPICAL

## 2020-02-16 MED ORDER — HEPARIN (PORCINE) 25000 UT/250ML-% IV SOLN
1300.0000 [IU]/h | INTRAVENOUS | Status: DC
  Administered 2020-02-16: 1300 [IU]/h via INTRAVENOUS
  Filled 2020-02-16: qty 250

## 2020-02-16 MED ORDER — ZINC SULFATE 220 (50 ZN) MG PO CAPS
220.0000 mg | ORAL_CAPSULE | Freq: Every day | ORAL | Status: DC
  Administered 2020-02-16 – 2020-03-02 (×16): 220 mg via ORAL
  Filled 2020-02-16 (×16): qty 1

## 2020-02-16 MED ORDER — HEPARIN BOLUS VIA INFUSION
3000.0000 [IU] | Freq: Once | INTRAVENOUS | Status: AC
  Administered 2020-02-16: 3000 [IU] via INTRAVENOUS
  Filled 2020-02-16: qty 3000

## 2020-02-16 MED ORDER — GUAIFENESIN-DM 100-10 MG/5ML PO SYRP
10.0000 mL | ORAL_SOLUTION | ORAL | Status: DC | PRN
  Administered 2020-02-21: 10 mL via ORAL
  Filled 2020-02-16: qty 10

## 2020-02-16 MED ORDER — ACETAMINOPHEN 325 MG PO TABS
650.0000 mg | ORAL_TABLET | Freq: Four times a day (QID) | ORAL | Status: DC | PRN
  Administered 2020-02-17 – 2020-02-27 (×6): 650 mg via ORAL
  Filled 2020-02-16 (×6): qty 2

## 2020-02-16 MED ORDER — HEPARIN SODIUM (PORCINE) 10000 UNIT/ML IJ SOLN
7500.0000 [IU] | Freq: Three times a day (TID) | INTRAMUSCULAR | Status: DC
  Administered 2020-02-16 – 2020-03-02 (×40): 7500 [IU] via SUBCUTANEOUS
  Filled 2020-02-16 (×51): qty 1

## 2020-02-16 MED ORDER — INSULIN ASPART 100 UNIT/ML ~~LOC~~ SOLN
0.0000 [IU] | Freq: Every day | SUBCUTANEOUS | Status: DC
  Administered 2020-02-16 – 2020-02-20 (×3): 2 [IU] via SUBCUTANEOUS
  Administered 2020-02-22 – 2020-02-24 (×2): 3 [IU] via SUBCUTANEOUS
  Administered 2020-02-25: 4 [IU] via SUBCUTANEOUS
  Administered 2020-02-26: 2 [IU] via SUBCUTANEOUS
  Administered 2020-02-27: 3 [IU] via SUBCUTANEOUS

## 2020-02-16 MED ORDER — PENTAFLUOROPROP-TETRAFLUOROETH EX AERO
1.0000 "application " | INHALATION_SPRAY | CUTANEOUS | Status: DC | PRN
  Filled 2020-02-16: qty 116

## 2020-02-16 MED ORDER — INSULIN ASPART 100 UNIT/ML ~~LOC~~ SOLN
0.0000 [IU] | Freq: Three times a day (TID) | SUBCUTANEOUS | Status: DC
  Administered 2020-02-16: 4 [IU] via SUBCUTANEOUS
  Administered 2020-02-16: 2 [IU] via SUBCUTANEOUS
  Administered 2020-02-17: 4 [IU] via SUBCUTANEOUS
  Administered 2020-02-18: 3 [IU] via SUBCUTANEOUS
  Administered 2020-02-18: 1 [IU] via SUBCUTANEOUS
  Administered 2020-02-19: 2 [IU] via SUBCUTANEOUS
  Administered 2020-02-20 (×2): 1 [IU] via SUBCUTANEOUS
  Administered 2020-02-20: 3 [IU] via SUBCUTANEOUS
  Administered 2020-02-21 (×2): 1 [IU] via SUBCUTANEOUS
  Administered 2020-02-23: 2 [IU] via SUBCUTANEOUS
  Administered 2020-02-24: 1 [IU] via SUBCUTANEOUS
  Administered 2020-02-24: 3 [IU] via SUBCUTANEOUS
  Administered 2020-02-25: 2 [IU] via SUBCUTANEOUS
  Administered 2020-02-25: 1 [IU] via SUBCUTANEOUS
  Administered 2020-02-26 (×2): 2 [IU] via SUBCUTANEOUS
  Administered 2020-02-27: 1 [IU] via SUBCUTANEOUS
  Administered 2020-02-27: 2 [IU] via SUBCUTANEOUS
  Administered 2020-02-29: 1 [IU] via SUBCUTANEOUS
  Administered 2020-02-29: 2 [IU] via SUBCUTANEOUS
  Administered 2020-03-01 – 2020-03-02 (×2): 1 [IU] via SUBCUTANEOUS

## 2020-02-16 MED ORDER — AMLODIPINE BESYLATE 10 MG PO TABS
10.0000 mg | ORAL_TABLET | Freq: Every evening | ORAL | Status: DC
  Administered 2020-02-16 – 2020-02-27 (×10): 10 mg via ORAL
  Filled 2020-02-16 (×11): qty 1

## 2020-02-16 MED ORDER — INSULIN ASPART 100 UNIT/ML ~~LOC~~ SOLN
5.0000 [IU] | Freq: Three times a day (TID) | SUBCUTANEOUS | Status: DC
  Administered 2020-02-17 – 2020-02-19 (×6): 5 [IU] via SUBCUTANEOUS

## 2020-02-16 MED ORDER — CEFAZOLIN SODIUM-DEXTROSE 2-4 GM/100ML-% IV SOLN: 2.0000 g | INTRAVENOUS | Status: AC

## 2020-02-16 MED ORDER — ASCORBIC ACID 500 MG PO TABS
500.0000 mg | ORAL_TABLET | Freq: Every day | ORAL | Status: DC
  Administered 2020-02-16 – 2020-03-02 (×16): 500 mg via ORAL
  Filled 2020-02-16 (×16): qty 1

## 2020-02-16 MED ORDER — LIDOCAINE-PRILOCAINE 2.5-2.5 % EX CREA
1.0000 "application " | TOPICAL_CREAM | CUTANEOUS | Status: DC | PRN
  Filled 2020-02-16: qty 5

## 2020-02-16 MED ORDER — SODIUM CHLORIDE 0.9 % IV SOLN: INTRAVENOUS | Status: AC

## 2020-02-16 NOTE — Consult Note (Addendum)
[facility]  INPATIENT CONSULTATION  Reason for Consultation: AKI on CKD Requesting Provider: Dr. Wyline Copas  HPI: Joann Gutierrez is an 75 y.o. female with COPD on home O2, CAD, HFpEF, DM, HTN, HL, OSA, obesity and CKD 5 who is currently admitted with COVID 19 PNA and is seen for AKI on CKD.   Patient unable to provide any history due to mental status and daughter and 'friend' listed in chart don't answer.   Arrived via EMS last PM hypoxic, febrile requiring Bipap.  COVID +, CXR infiltrate. Cr 6, K 5, Bicarb 16, BUN 81, Hb 10.4.  Admitted for COVID therapy and nephrology consulted re: dialysis.   Pt follows with Dr. Posey Pronto at [facility] - she was seen last in 12/2019 and was having some low grade uremic symptoms; was offered start HD vs return in 4-6 weeks and chose latter.  She has a viable AV access in RUE.    PMH: Past Medical History:  Diagnosis Date  . Adenomatous colon polyp   . Allergy   . Anemia   . Asthma       . CAD (coronary artery disease)    Mild very minimal coronary disease with 20% obtuse marginal stenosis  . Carpal tunnel syndrome on left   . Cataract   . CHF (congestive heart failure) ([place])   . Chronic kidney disease (CKD), stage III (moderate) (HCC)    now stage 4  . COPD (chronic obstructive pulmonary disease) ([place])   . Depression   . 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 Diverticulosis   . Dyspnea   . 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 Esophageal dysmotility   . Esophageal stricture   . Gastritis   . Gastroparesis 08/21/2007  . GERD (gastroesophageal reflux disease)   . Hearing loss of both ears   . Hernia, hiatal   . Hyperlipidemia   . Hypertension   . Morbid obesity ([place])   . OSTEOARTHRITIS 08/09/2006  . Osteoporosis   . Oxygen deficiency    prn   . 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 RENAL INSUFFICIENCY 02/16/2009  . 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 Seizures ([place])    pt thinks it has been several monthes  since she had a seisure  . Shingles   . Sickle cell trait ([place])   . Sleep apnea    uses cpap  . Stroke [facility])    PSH: Past Surgical History:  Procedure Laterality Date  . ABDOMINAL HYSTERECTOMY    . ARTERY BIOPSY  01/07/2011   Procedure: MINOR BIOPSY TEMPORAL ARTERY;  Surgeon: Haywood Lasso, MD;  Location: [place];  Service: General;  Laterality: Left;  left temporal artery biopsy  . AV FISTULA PLACEMENT Right 11/16/2018   Procedure: CREATION RIGHT BRACHIOCEPHALIC FISTULA  ARTERIOVENOUS FISTULA;  Surgeon: Angelia Mould, MD;  Location: [place];  Service: Vascular;  Laterality: Right;  . bil foot surgery    . BREAST LUMPECTOMY     benign  . BREAST LUMPECTOMY     both breast lumps removed   . CATARACT EXTRACTION Left 06/2016   Dr. Read Drivers  . COLONOSCOPY    . COLONOSCOPY WITH PROPOFOL N/A 07/05/2015   Procedure: COLONOSCOPY WITH PROPOFOL;  Surgeon: Manus Gunning, MD;  [facility];  Service: Gastroenterology;  Laterality: N/A;  . COLONOSCOPY WITH PROPOFOL N/A 08/04/2018   Procedure: COLONOSCOPY WITH PROPOFOL;  Surgeon: Yetta Flock, MD;  [facility];  Service: Gastroenterology;  Laterality: N/A;  . ERD  08/08/2000  . ESOPHAGEAL MANOMETRY N/A 03/13/2015   Procedure: ESOPHAGEAL MANOMETRY (EM);  Surgeon: Manus Gunning, MD;  [facility];  Service: Gastroenterology;  Laterality: N/A;  . ESOPHAGOGASTRODUODENOSCOPY  06/25/2006  . ESOPHAGOGASTRODUODENOSCOPY (EGD) WITH PROPOFOL N/A 07/05/2015   Procedure: ESOPHAGOGASTRODUODENOSCOPY (EGD) WITH PROPOFOL;  Surgeon: Manus Gunning, MD;  [facility];  Service: Gastroenterology;  Laterality: N/A;  . FISTULA SUPERFICIALIZATION Right 11/16/2018   Procedure: Fistula Superficialization;  Surgeon: Angelia Mould, MD;  Location: [place];  Service: Vascular;  Laterality: Right;  . HERNIA REPAIR    . LEFT AND RIGHT HEART CATHETERIZATION WITH CORONARY  ANGIOGRAM N/A 03/03/2013   Procedure: LEFT AND RIGHT HEART CATHETERIZATION WITH CORONARY ANGIOGRAM;  Surgeon: Minus Breeding, MD;  Location: [facility] CATH LAB;  Service: Cardiovascular;  Laterality: N/A;  . REPLACEMENT TOTAL KNEE Left 1998  . UPPER GASTROINTESTINAL ENDOSCOPY      Past Medical History:  Diagnosis Date  . Adenomatous colon polyp   . Allergy   . Anemia   . Asthma       . CAD (coronary artery disease)    Mild very minimal coronary disease with 20% obtuse marginal stenosis  . Carpal tunnel syndrome on left   . Cataract   . CHF (congestive heart failure) ([place])   . Chronic kidney disease (CKD), stage III (moderate) (HCC)    now stage 4  . COPD (chronic obstructive pulmonary disease) ([place])   . Depression   . 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 Diverticulosis   . Dyspnea   . 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 Esophageal dysmotility   . Esophageal stricture   . Gastritis   . Gastroparesis 08/21/2007  . GERD (gastroesophageal reflux disease)   . Hearing loss of both ears   . Hernia, hiatal   . Hyperlipidemia   . Hypertension   . Morbid obesity ([place])   . OSTEOARTHRITIS 08/09/2006  . Osteoporosis   . Oxygen deficiency    prn   . 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 RENAL INSUFFICIENCY 02/16/2009  . 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 Seizures ([place])    pt thinks it has been several monthes since she had a seisure  . Shingles   . Sickle cell trait ([place])   . Sleep apnea    uses cpap  . Stroke [facility])     Medications:  I have reviewed the patient's current medications.  (Not in a hospital admission)   ALLERGIES:   Allergies  Allergen Reactions  . Morphine And Related Other (See Comments)    Family request not to be given, reports pt does not wake up when given   . Promethazine Hcl Other (See Comments)    REACTION: lethargy    FAM HX: Family History  Problem Relation Age of Onset  . Liver cancer Mother        Liver Cancer  .  Diabetes Mother   . Kidney disease Mother   . Heart disease Mother        age 40's  . Heart disease Father 55       MI  . Heart attack Father        died of MI when pt was 45  . Sickle cell anemia Father   . Colon cancer Brother   . Cancer Brother        Colon Cancer  . Diabetes Sister   .  Kidney disease Sister   . Heart disease Sister        age 47's  . Allergies Sister   . Diabetes Sister   . Kidney disease Sister   . Heart disease Sister        age 91's  . Esophageal cancer Neg Hx   . Rectal cancer Neg Hx   . Stomach cancer Neg Hx   . Amblyopia Neg Hx   . Blindness Neg Hx   . Glaucoma Neg Hx   . Macular degeneration Neg Hx   . Retinal detachment Neg Hx   . Cataracts Neg Hx   . Strabismus Neg Hx   . Retinitis pigmentosa Neg Hx     Social History:   reports that she quit smoking about 39 years ago. She has a 5.00 pack-year smoking history. She has never used smokeless tobacco. She reports that she does not drink alcohol and does not use drugs.  ROS: unable to obtain due to patient factors  Blood pressure (!) 158/80, pulse 83, temperature (!) 101.8 F (38.8 C), temperature source Oral, resp. rate (!) 25, height 5\' 4"  (1.626 m), weight 116.1 kg, SpO2 94 %. PHYSICAL EXAM: Gen: lying flat on bed, lethargic and difficult to arouse  Eyes: anicteric ENT: MMM Neck: supple CV:  RRR, no rub, II/VI SEM Abd:  Soft, nontender Back: coarse BS noted ant and laterally, no rales; normal WOB GU: no foley Extr:  No edema in R, 1+ on L, RUE AVF +t/b Neuro: arouses to shoulder shake but quickly back to sleep, doesn't answer questions, appears to move 4 extr grossly Skin: warm and dry   Results for orders placed or performed during the hospital encounter of 02/15/20 (from the past 48 hour(s))  Basic metabolic panel     Status: Abnormal   Collection Time: 02/15/20  8:43 PM  Result Value Ref Range   Sodium 141 135 - 145 mmol/L   Potassium 5.0 3.5 - 5.1 mmol/L   Chloride 110 98 -  111 mmol/L   CO2 16 (L) 22 - 32 mmol/L   Glucose, Bld 271 (H) 70 - 99 mg/dL    Comment: Glucose reference range applies only to samples taken after fasting for at least 8 hours.   BUN 81 (H) 8 - 23 mg/dL   Creatinine, Ser 6.05 (H) 0.44 - 1.00 mg/dL   Calcium 7.5 (L) 8.9 - 10.3 mg/dL   GFR, Estimated 7 (L) >60 mL/min    Comment: (NOTE) Calculated using the CKD-EPI Creatinine Equation (2021)    Anion gap 15 5 - 15    Comment: Performed at [facility] [address], [place], [place] [address]  CBC     Status: Abnormal   Collection Time: 02/15/20  8:43 PM  Result Value Ref Range   WBC 4.9 4.0 - 10.5 K/uL   RBC 5.10 3.87 - 5.11 MIL/uL   Hemoglobin 10.4 (L) 12.0 - 15.0 g/dL   HCT 37.3 36.0 - 46.0 %   MCV 73.1 (L) 80.0 - 100.0 fL   MCH 20.4 (L) 26.0 - 34.0 pg   MCHC 27.9 (L) 30.0 - 36.0 g/dL   RDW 21.5 (H) 11.5 - 15.5 %   Platelets 76 (L) 150 - 400 K/uL    Comment: REPEATED TO VERIFY PLATELET COUNT CONFIRMED BY SMEAR Immature Platelet Fraction may be clinically indicated, consider ordering this additional test [id number]    nRBC 0.4 (H) 0.0 - 0.2 %    Comment: Performed at  [facility], [place] [address] [place], [place] [address]  Troponin I (High Sensitivity)     Status: Abnormal   Collection Time: 02/15/20  8:43 PM  Result Value Ref Range   Troponin I (High Sensitivity) 56 (H) <18 ng/L    Comment: (NOTE) Elevated high sensitivity troponin I (hsTnI) values and significant  changes across serial measurements may suggest ACS but many other  chronic and acute conditions are known to elevate hsTnI results.  Refer to the "Links" section for chest pain algorithms and additional  guidance. Performed at [facility], [place] [address] [place], [place] [address]   Protime-INR (order if Patient is taking Coumadin / Warfarin)     Status: None   Collection Time: 02/15/20  8:43 PM  Result Value Ref Range   Prothrombin Time 14.4 11.4 - 15.2 seconds   INR 1.2 0.8 - 1.2     Comment: (NOTE) INR goal varies based on device and disease states. Performed at [facility], [place] [address] [place], [place] [address]   Magnesium     Status: None   Collection Time: 02/15/20  8:43 PM  Result Value Ref Range   Magnesium 1.7 1.7 - 2.4 mg/dL    Comment: Performed at [facility], [place] [address] [place], [place] [address]  Brain natriuretic peptide     Status: None   Collection Time: 02/15/20  8:44 PM  Result Value Ref Range   B Natriuretic Peptide 73.8 0.0 - 100.0 pg/mL    Comment: Performed at [facility] [address] [place], [place] [address]  SARS Coronavirus 2 by RT PCR (hospital order, performed in [facility] hospital lab) Nasopharyngeal Nasopharyngeal Swab     Status: Abnormal   Collection Time: 02/15/20  8:44 PM   Specimen: Nasopharyngeal Swab  Result Value Ref Range   SARS Coronavirus 2 POSITIVE (A) NEGATIVE    Comment: CRITICAL RESULT CALLED TO, READ BACK BY AND VERIFIED WITH: DR Aldona Bar P. [phone] [id number] FCP (NOTE) SARS-CoV-2 target nucleic acids are DETECTED  SARS-CoV-2 RNA is generally detectable in upper respiratory specimens  during the acute phase of infection.  Positive results are indicative  of the presence of the identified virus, but do not rule out bacterial infection or co-infection with other pathogens not detected by the test.  Clinical correlation with patient history and  other diagnostic information is necessary to determine patient infection status.  The expected result is negative.  Fact Sheet for Patients:   StrictlyIdeas.no   Fact Sheet for Healthcare Providers:   BankingDealers.co.za    This test is not yet approved or cleared by the [place] FDA and  has been authorized for detection and/or diagnosis of SARS-CoV-2 by FDA under an Emergency Use Authorization (EUA).  This EUA will remain in effect (meaning th is test can be used) for the duration of   the COVID-19 declaration under Section 564(b)(1) of the Act, 21 U.S.C. section 360-bbb-3(b)(1), unless the authorization is terminated or revoked sooner.  Performed at [facility], [place] [address], [place], [place] [address]   Lactic acid, plasma     Status: None   Collection Time: 02/15/20  9:04 PM  Result Value Ref Range   Lactic Acid, Venous 1.5 0.5 - 1.9 mmol/L    Comment: Performed at [facility] [address] [place],  [address]  APTT     Status: None   Collection Time: 02/15/20  9:04 PM  Result Value Ref Range   aPTT 30 24 - 36 seconds    Comment: Performed at [facility], [place] [address], [place], [place] [address]  Blood Culture (routine x 2)     Status: None (Preliminary result)   Collection Time: 02/15/20  9:04 PM   Specimen: BLOOD  Result Value Ref Range   Specimen Description BLOOD SITE NOT SPECIFIED    Special Requests      BOTTLES DRAWN AEROBIC AND ANAEROBIC Blood Culture adequate volume   Culture      NO GROWTH < 24 HOURS Performed at [facility], [place] [address] [place], [place] [address]    Report Status PENDING   Hepatic function panel     Status: Abnormal   Collection Time: 02/15/20  9:04 PM  Result Value Ref Range   Total Protein 7.7 6.5 - 8.1 g/dL   Albumin 2.9 (L) 3.5 - 5.0 g/dL   AST 29 15 - 41 U/L   ALT 11 0 - 44 U/L   Alkaline Phosphatase 58 38 - 126 U/L   Total Bilirubin 0.6 0.3 - 1.2 mg/dL   Bilirubin, Direct 0.4 (H) 0.0 - 0.2 mg/dL   Indirect Bilirubin 0.2 (L) 0.3 - 0.9 mg/dL    Comment: Performed at [facility] [address], [place], [place] [address]  I-Stat venous blood gas, ED     Status: Abnormal   Collection Time: 02/15/20  9:39 PM  Result Value Ref Range   pH, Ven 7.284 7.250 - 7.430   pCO2, Ven 44.9 44.0 - 60.0 mmHg   pO2, Ven 41.0 32.0 - 45.0 mmHg   Bicarbonate 21.3 20.0 - 28.0 mmol/L   TCO2 23 22 - 32 mmol/L   O2 Saturation 69.0 %   Acid-base deficit 5.0 (H) 0.0 - 2.0 mmol/L    Sodium 144 135 - 145 mmol/L   Potassium 4.9 3.5 - 5.1 mmol/L   Calcium, Ion 1.02 (L) 1.15 - 1.40 mmol/L   HCT 35.0 (L) 36.0 - 46.0 %   Hemoglobin 11.9 (L) 12.0 - 15.0 g/dL   Sample type VENOUS   Lactic acid, plasma     Status: None   Collection Time: 02/15/20 10:35 PM  Result Value Ref Range   Lactic Acid, Venous 1.2 0.5 - 1.9 mmol/L    Comment: Performed at [facility] [address], [place], [place] [address]  Troponin I (High Sensitivity)     Status: Abnormal   Collection Time: 02/15/20 10:43 PM  Result Value Ref Range   Troponin I (High Sensitivity) 56 (H) <18 ng/L    Comment: (NOTE) Elevated high sensitivity troponin I (hsTnI) values and significant  changes across serial measurements may suggest ACS but many other  chronic and acute conditions are known to elevate hsTnI results.  Refer to the "Links" section for chest pain algorithms and additional  guidance. Performed at [facility], [place] [address], [place], [place] [address]   D-dimer, quantitative     Status: Abnormal   Collection Time: 02/16/20 12:53 AM  Result Value Ref Range   D-Dimer, Quant 2.95 (H) 0.00 - 0.50 ug/mL-FEU    Comment: (NOTE) At the manufacturer cut-off value of 0.5 g/mL FEU, this assay has a negative predictive value of 95-100%.This assay is intended for use in conjunction with a clinical pretest probability (PTP) assessment model to exclude pulmonary embolism (PE) and deep venous thrombosis (DVT) in outpatients suspected of PE or  DVT. Results should be correlated with clinical presentation. Performed at [facility], [place] [address], [place], [place] [address]   Procalcitonin     Status: None   Collection Time: 02/16/20 12:53 AM  Result Value Ref Range   Procalcitonin 1.63 ng/mL    Comment:        Interpretation: PCT > 0.5 ng/mL and <= 2 ng/mL: Systemic infection (sepsis) is possible, but other conditions are known to elevate PCT as well. (NOTE)       Sepsis PCT  Algorithm           Lower Respiratory Tract                                      Infection PCT Algorithm    ----------------------------     ----------------------------         PCT < 0.25 ng/mL                PCT < 0.10 ng/mL          Strongly encourage             Strongly discourage   discontinuation of antibiotics    initiation of antibiotics    ----------------------------     -----------------------------       PCT 0.25 - 0.50 ng/mL            PCT 0.10 - 0.25 ng/mL               OR       >80% decrease in PCT            Discourage initiation of                                            antibiotics      Encourage discontinuation           of antibiotics    ----------------------------     -----------------------------         PCT >= 0.50 ng/mL              PCT 0.26 - 0.50 ng/mL                AND       <80% decrease in PCT             Encourage initiation of                                             antibiotics       Encourage continuation           of antibiotics    ----------------------------     -----------------------------        PCT >= 0.50 ng/mL                  PCT > 0.50 ng/mL               AND         increase in PCT                  Strongly encourage  initiation of antibiotics    Strongly encourage escalation           of antibiotics                                     -----------------------------                                           PCT <= 0.25 ng/mL                                                 OR                                        > 80% decrease in PCT                                      Discontinue / Do not initiate                                             antibiotics  Performed at [facility], [address] [place], [place] [address]   Lactate dehydrogenase     Status: Abnormal   Collection Time: 02/16/20 12:53 AM  Result Value Ref Range   LDH 315 (H) 98 - 192 U/L    Comment: Performed at  [facility], [place] [address], [place], [place] [address]  Ferritin     Status: Abnormal   Collection Time: 02/16/20 12:53 AM  Result Value Ref Range   Ferritin 919 (H) 11 - 307 ng/mL    Comment: Performed at [facility], [place] [address], [place], [place] [address]  Fibrinogen     Status: Abnormal   Collection Time: 02/16/20 12:53 AM  Result Value Ref Range   Fibrinogen 592 (H) 210 - 475 mg/dL    Comment: Performed at [facility] [address] [place], [place] [address]  C-reactive protein     Status: Abnormal   Collection Time: 02/16/20 12:53 AM  Result Value Ref Range   CRP 17.3 (H) <1.0 mg/dL    Comment: Performed at [facility] [address] [place], [place] [address]  Triglycerides     Status: Abnormal   Collection Time: 02/16/20 12:55 AM  Result Value Ref Range   Triglycerides 158 (H) <150 mg/dL    Comment: Performed at [facility] [address], [place], [place] [address]  Procalcitonin     Status: None   Collection Time: 02/16/20  5:05 AM  Result Value Ref Range   Procalcitonin 1.83 ng/mL    Comment:        Interpretation: PCT > 0.5 ng/mL and <= 2 ng/mL: Systemic infection (sepsis) is possible, but other conditions are known to elevate PCT as well. (NOTE)       Sepsis PCT Algorithm  Lower Respiratory Tract                                      Infection PCT Algorithm    ----------------------------     ----------------------------         PCT < 0.25 ng/mL                PCT < 0.10 ng/mL          Strongly encourage             Strongly discourage   discontinuation of antibiotics    initiation of antibiotics    ----------------------------     -----------------------------       PCT 0.25 - 0.50 ng/mL            PCT 0.10 - 0.25 ng/mL               OR       >80% decrease in PCT            Discourage initiation of                                            antibiotics      Encourage discontinuation           of  antibiotics    ----------------------------     -----------------------------         PCT >= 0.50 ng/mL              PCT 0.26 - 0.50 ng/mL                AND       <80% decrease in PCT             Encourage initiation of                                             antibiotics       Encourage continuation           of antibiotics    ----------------------------     -----------------------------        PCT >= 0.50 ng/mL                  PCT > 0.50 ng/mL               AND         increase in PCT                  Strongly encourage                                      initiation of antibiotics    Strongly encourage escalation           of antibiotics                                     -----------------------------  PCT <= 0.25 ng/mL                                                 OR                                        > 80% decrease in PCT                                      Discontinue / Do not initiate                                             antibiotics  Performed at [facility], [place] [address] [place], [place] [address]   Vitamin B12     Status: None   Collection Time: 02/16/20  5:05 AM  Result Value Ref Range   Vitamin B-12 567 180 - 914 pg/mL    Comment: (NOTE) This assay is not validated for testing neonatal or myeloproliferative syndrome specimens for Vitamin B12 levels. Performed at [facility], [place] [address] [place], [place] [address]   TSH     Status: None   Collection Time: 02/16/20  5:05 AM  Result Value Ref Range   TSH 0.758 0.350 - 4.500 uIU/mL    Comment: Performed by a 3rd Generation assay with a functional sensitivity of <=0.01 uIU/mL. Performed at [facility], [place] [address], [place], [place] [address]   Ammonia     Status: None   Collection Time: 02/16/20  5:05 AM  Result Value Ref Range   Ammonia 21 9 - 35 umol/L    Comment: Performed at [facility], [place] [address]  [place], [place] [address]  CBG monitoring, ED     Status: Abnormal   Collection Time: 02/16/20  6:33 AM  Result Value Ref Range   Glucose-Capillary 321 (H) 70 - 99 mg/dL    Comment: Glucose reference range applies only to samples taken after fasting for at least 8 hours.  I-Stat venous blood gas, [facility] ED)     Status: Abnormal   Collection Time: 02/16/20  6:43 AM  Result Value Ref Range   pH, Ven 7.267 7.250 - 7.430   pCO2, Ven 44.2 44.0 - 60.0 mmHg   pO2, Ven 55.0 (H) 32.0 - 45.0 mmHg   Bicarbonate 20.1 20.0 - 28.0 mmol/L   TCO2 21 (L) 22 - 32 mmol/L   O2 Saturation 84.0 %   Acid-base deficit 7.0 (H) 0.0 - 2.0 mmol/L   Sodium 142 135 - 145 mmol/L   Potassium 5.2 (H) 3.5 - 5.1 mmol/L   Calcium, Ion 0.96 (L) 1.15 - 1.40 mmol/L   HCT 30.0 (L) 36.0 - 46.0 %   Hemoglobin 10.2 (L) 12.0 - 15.0 g/dL   Sample type VENOUS   Hemoglobin A1c     Status: Abnormal   Collection Time: 02/16/20  6:46 AM  Result Value Ref Range   Hgb A1c MFr Bld 7.4 (H) 4.8 - 5.6 %    Comment: (NOTE) Pre  diabetes:          5.7%-6.4%  Diabetes:              >6.4%  Glycemic control for   <7.0% adults with diabetes    Mean Plasma Glucose 165.68 mg/dL    Comment: Performed at [facility] [address] [place], [place] [address]  Urinalysis, Routine w reflex microscopic     Status: Abnormal   Collection Time: 02/16/20 11:00 AM  Result Value Ref Range   Color, Urine AMBER (A) YELLOW    Comment: BIOCHEMICALS MAY BE AFFECTED BY COLOR   APPearance CLOUDY (A) CLEAR   Specific Gravity, Urine 1.015 1.005 - 1.030   pH 5.0 5.0 - 8.0   Glucose, UA NEGATIVE NEGATIVE mg/dL   Hgb urine dipstick SMALL (A) NEGATIVE   Bilirubin Urine NEGATIVE NEGATIVE   Ketones, ur NEGATIVE NEGATIVE mg/dL   Protein, ur >=300 (A) NEGATIVE mg/dL   Nitrite NEGATIVE NEGATIVE   Leukocytes,Ua LARGE (A) NEGATIVE   RBC / HPF 0-5 0 - 5 RBC/hpf   WBC, UA >50 (H) 0 - 5 WBC/hpf   Bacteria, UA MANY (A) NONE SEEN    Comment: Performed at [facility], [place] [address], [place], [place]  [address]  Sodium, urine, random     Status: None   Collection Time: 02/16/20 11:00 AM  Result Value Ref Range   Sodium, Ur 52 mmol/L    Comment: Performed at [facility] [address], [place], [place] [address]  Creatinine, urine, random     Status: None   Collection Time: 02/16/20 11:00 AM  Result Value Ref Range   Creatinine, Urine 201.29 mg/dL    Comment: Performed at [facility] [address], [place], [place] [address]  CBG monitoring, ED     Status: Abnormal   Collection Time: 02/16/20 12:35 PM  Result Value Ref Range   Glucose-Capillary 251 (H) 70 - 99 mg/dL    Comment: Glucose reference range applies only to samples taken after fasting for at least 8 hours.    CT HEAD WO CONTRAST  Result Date: 02/16/2020 CLINICAL DATA:  Altered mental status, somnolence EXAM: CT HEAD WITHOUT CONTRAST TECHNIQUE: Contiguous axial images were obtained from the base of the skull through the vertex without intravenous contrast. COMPARISON:  Eight/19/19 FINDINGS: Brain: Normal anatomic configuration. Parenchymal volume loss is commensurate with the patient's age and stable since prior examination. Stable moderate periventricular white matter changes are present likely reflecting the sequela of small vessel ischemia. Tiny remote infarct within the a left cerebellar hemisphere is again noted. No abnormal intra or extra-axial mass lesion or fluid collection. No abnormal mass effect or midline shift. No evidence of acute intracranial hemorrhage or infarct. Ventricular size is normal. Cerebellum unremarkable. Vascular: No asymmetric hyperdense vasculature at the skull base. Skull: Intact Sinuses/Orbits: Mild mucosal thickening within the right maxillary sinus. Small layering mucus within the left sphenoid sinus. Remaining paranasal sinuses are clear. Orbits are unremarkable. Other: Mastoid air cells and middle ear cavities are clear. IMPRESSION: No  acute intracranial abnormality. Stable senescent changes.  Remote tiny left cerebellar infarct. Minimal paranasal sinus disease. Electronically Signed   By: Fidela Salisbury MD   On: 02/16/2020 05:32   US RENAL  Result Date: 02/16/2020 CLINICAL DATA:  Acute renal injury. EXAM: RENAL / URINARY TRACT ULTRASOUND COMPLETE COMPARISON:  Ultrasound 03/19/2019. FINDINGS: Right Kidney: Renal measurements: 9.4 x 4.5 x 4.3 cm = volume: 93.6 mL. Mild  increased echogenicity cannot be excluded. No mass or hydronephrosis visualized. Left Kidney: Renal measurements: 9.1 x 4.7 x 4.2 cm = volume: 94.8 mL. Mild increased echogenicity cannot be excluded. 3.6 cm simple cyst lower pole. No hydronephrosis visualized. Bladder: Appears normal for degree of bladder distention. Other: None. IMPRESSION: 1. Mild increased echogenicity both kidneys suggesting chronic renal disease cannot be excluded. 2. 3.6 cm simple cyst lower pole left kidney. No acute abnormality. No hydronephrosis. No bladder distention. Electronically Signed   By: Marcello Moores  Register   On: 02/16/2020 05:14   DG Chest Portable 1 View  Result Date: 02/15/2020 CLINICAL DATA:  75 year old female with shortness of breath. EXAM: PORTABLE CHEST 1 VIEW COMPARISON:  Chest radiograph dated 11/20/2017 and 10/06/2017. FINDINGS: Bilateral streaky and confluent densities, right greater left, most consistent with developing infiltrate, possibly viral or atypical in etiology. Clinical correlation is recommended. No large pleural effusion pneumothorax. Mild cardiomegaly. No acute osseous pathology. IMPRESSION: Findings most consistent with developing infiltrate predominantly involving the right lung. Electronically Signed   By: Anner Crete M.D.   On: 02/15/2020 21:02   VAS [place] LOWER EXTREMITY VENOUS (DVT) (ONLY [facility] & [facility])  Result Date: 02/16/2020  Lower Venous DVT Study Other Indications: D-Dimer. Risk Factors: None identified. Limitations: Body habitus and Pt unable to tolerate  any pressure on legs. Comparison Study: 11/17 Left Negative Performing Technologist: Vonzell Schlatter RVT  Examination Guidelines: A complete evaluation includes B-mode imaging, spectral Doppler, color Doppler, and power Doppler as needed of all accessible portions of each vessel. Bilateral testing is considered an integral part of a complete examination. Limited examinations for reoccurring indications may be performed as noted. The reflux portion of the exam is performed with the patient in reverse Trendelenburg.  +-----+---------------+---------+-----------+----------+-------------------+ RIGHTCompressibilityPhasicitySpontaneityPropertiesThrombus Aging      +-----+---------------+---------+-----------+----------+-------------------+ CFV  Full           Yes      Yes                                      +-----+---------------+---------+-----------+----------+-------------------+ SFJ  Full                                                             +-----+---------------+---------+-----------+----------+-------------------+ POP  Full           Yes      Yes                                      +-----+---------------+---------+-----------+----------+-------------------+ PERO                                              Not well visualized +-----+---------------+---------+-----------+----------+-------------------+ Adequate Color fill in Femoral vein and posterior tibials. Patient unable to tolerate compression  +----+---------------+---------+-----------+----------+-------------------+ LEFTCompressibilityPhasicitySpontaneityPropertiesThrombus Aging      +----+---------------+---------+-----------+----------+-------------------+ CFV Full           Yes      Yes                                      +----+---------------+---------+-----------+----------+-------------------+  SFJ Full                                                              +----+---------------+---------+-----------+----------+-------------------+ POP Full           Yes      Yes                                      +----+---------------+---------+-----------+----------+-------------------+ PTV                                              Not well visualized +----+---------------+---------+-----------+----------+-------------------+ PERO                                             Not well visualized +----+---------------+---------+-----------+----------+-------------------+ Adequate color fill of Femoral vein due to patients inability to tolerate touch or compressions. Calf veins not seen  Summary: RIGHT: - There is no evidence of deep vein thrombosis in the lower extremity. However, portions of this examination were limited- see technologist comments above.  - No cystic structure found in the popliteal fossa.  LEFT: - There is no evidence of deep vein thrombosis in the lower extremity. However, portions of this examination were limited- see technologist comments above.  - No cystic structure found in the popliteal fossa.  *See table(s) above for measurements and observations.    Preliminary     Assessment/Plan **Hypoxic respiratory failure:  COVID+, being treated per primary with remdesivir and steroids.  Ok on 5L currently. Does have underlying COPD as well.  **AKI on CKD 5:  Was having some uremic symptoms even last month but chose to manage expectantly, now quite ill with COVID and Cr up to 6.  Did receive gentle hydration overnight, anuric; repeat labs pending - K is 5.2.  Plan initiate dialysis today - use RUE AVF.    **AMS:  Suspect multifactorial but uremia may be playing a role, dialysis per above.   **DM: per primary, A1c 7.2  **LLE edema: DVT being ruled out  **Anemia:  Hb 10.2, monitor.  Will follow closely  Justin Mend 02/16/2020, 12:45 PM  ADDENDUM:  AVF infiltrated - will request TDC from IR.  Spoke with daughter via phone  earlier and updated her just now with plan for catheter now, stabilize then intervene on AVF.

## 2020-02-16 NOTE — ED Notes (Signed)
Lunch Tray Ordered @ 1032. 

## 2020-02-16 NOTE — ED Notes (Signed)
Attempted report x1. 

## 2020-02-16 NOTE — Progress Notes (Signed)
Received patient from ED alert. V/S taken. With Avf at right upper. Cannulated A-2, V-2 using gauge 17 and HD started on zero UF. And after 5mins of treatment patient suddenly move her hand and AVF infiltrated. DR Johnney Ou was informed and HD terminated.

## 2020-02-16 NOTE — ED Notes (Addendum)
Attempted report x 2 

## 2020-02-16 NOTE — Progress Notes (Signed)
PROGRESS NOTE    Joann Gutierrez  MGQ:[id number] DOB: [birth year] DOA: 02/15/2020 PCP: Andree Moro, DO    Brief Narrative:  75 y.o. 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 When EMS arrived she was satting 67-70% on her home CPAP machine which seemed to have a poor seal. On EMS CPAP, sats improved to 93-95%. Blood pressure elevated at 180/100 ED Course: Patient initially required BiPAP but later satting well on 5 L oxygen via nasal cannula. Febrile with temperature 101.8 F. Slightly tachycardic on arrival. Tachypneic. Not hypotensive. WBC 4.9, hemoglobin 10.4 (at baseline), platelet count 76K. Sodium 141, potassium 5.0, chloride 110, bicarb 16, anion gap 15, BUN 81, creatinine 6.0 (was 3.9 on 01/20/2020), glucose 271. High-sensitivity troponin 56 >56. EKG not suggestive of ACS. INR 1.2. Magnesium 1.7. BNP normal. SARS-CoV-2 PCR test positive. Lactic acid 1.5. Blood culture x2 pending. LFTs normal. VBG with pH 7.28. Lactic acid 1.2. Procalcitonin 1.63. Inflammatory markers elevated: D-dimer 2.95, LDH 315, ferritin 919, fibrinogen 592, CRP 17.3.  Chest x-ray showing findings consistent with developing infiltrate predominantly involving the right lung.  Assessment & Plan:   Principal Problem:   Pneumonia due to COVID-19 virus Active Problems:   AKI (acute kidney injury) ([place])   Sepsis ([place])   Acute on chronic respiratory failure with hypoxia (H[facility])   Edema of left lower leg    Sepsis and acute on chronic hypoxemic respiratory failure secondary to COVID-19 viral pneumonia: Meets criteria for sepsis-2 SIRS (fever, tachycardia, tachypnea) and  SARS-CoV-2 PCR test positive. No hypotension or lactic acidosis to suggest severe sepsis. Chest x-ray showing findings consistent with developing infiltrate predominantly involving the right lung. Inflammatory markers elevated: D-dimer 2.95, LDH 315, ferritin 919, fibrinogen 592, CRP 17.3. Initially required BiPAP but now satting well on 5 L supplemental oxygen. -Remdesivir -IV Solu-Medrol 0.5 mg/kg every 12 hours -Given elevated procalcitonin of 1.63 at presentation, continued on antibiotics for coverage of bacterial pneumonia as well. -Vitamin C, zinc, vitamin D -Antitussive as needed -Tylenol as needed -Bronchodilator as needed -follow inflammatory markers including ferritin, fibrinogen, D-dimer, CRP, LDH -Check procalcitonin level -Daily CBC with differential, CMP, CRP, D-dimer, procalcitonin -Airborne and contact precautions -Incentive spirometry, flutter valve -Encourage prone positioning -Continuous pulse ox and wean o2 as tolerated -Supplemental oxygen as needed to keep oxygen saturation above 90% -f/u on Blood culture  AKI on CKD stage V: Patient has an AV graft in place. BUN 81, creatinine 6.0 (was 3.9 on 01/20/2020). Likely prerenal from dehydration in the setting of acute viral illness in the setting of home diuretic and ARB. Patient still makes urine. -IV fluid hydration. Monitor renal function and urine output. Order renal ultrasound. Check urine sodium and creatinine. Avoid nephrotoxic agents/contrast, hold Lasix, spironolactone, and losartan. Nephrology consulted  Metabolic acidosis: Bicarb 16, anion gap 15. Likely due to AKI on CKD stage V. VBG with pH within normal range. -Bicarb supplementation, continue to monitor  Left lower extremity edema/concern for DVT: She has unilateral left lower extremity edema and D-dimer elevated in the setting of Covid infection. -Left lower extremity Doppler ordered to rule out DVT. Continue unfractionated heparin for anticoagulation.  Trend D-dimer, if it continues to increase, will need VQ scan to assess for PE  as well.  Acute encephalopathy: Patient is currently somnolent, opens eyes intermittently and falls back asleep.   -Stat head CT reviewed, neg , stat ABG reviewed, unremarkable  Elevated troponin: High-sensitivity troponin slightly elevated but stable (56 > 56). EKG without acute ischemic changes. Troponin elevation likely due to demand ischemia in setting of Covid pneumonia. -continue Cardiac monitoring  Insulin-dependent diabetes mellitus -Check A1c. Sliding scale insulin very sensitive ACHS.  Thrombocytopenia: Platelet count currently 76K, was 121K on 01/20/2020 but there are no other prior labs available for comparison. Thrombocytopenia likely related to chronic renal disease and now worse in the setting of acute viral illness. -Continue to monitor platelet count as patient is currently on heparin due to concern for DVT  COPD: Stable. No wheezing. -Resume home inhalers after pharmacy med rec is done.   DVT prophylaxis: Heparin subq Code Status: Full Family Communication: Pt in room, family not at bedside  Status is: Inpatient  Remains inpatient appropriate because:Unsafe d/c plan and Inpatient level of care appropriate due to severity of illness   Dispo: The patient is from: Home              Anticipated d/c is to: Home              Anticipated d/c date is: 3 days              Patient currently is not medically stable to d/c.   Difficult to place patient No       Consultants:   Nephrology  Procedures:     Antimicrobials: Anti-infectives (From admission, onward)   Start     Dose/Rate Route Frequency Ordered Stop   02/17/20 0600  ceFAZolin (ANCEF) IVPB 2g/100 mL premix        2 g 200 mL/hr over 30 Minutes Intravenous On call 02/16/20 1635 02/18/20 0600   02/16/20 1000  remdesivir 100 mg in sodium chloride 0.9 % 100 mL IVPB       "Followed by" Linked Group Details   100 mg 200  mL/hr over 30 Minutes Intravenous Daily 02/15/20 2256 02/20/20 0959   02/16/20 0000  remdesivir 200 mg in sodium chloride 0.9% 250 mL IVPB       "Followed by" Linked Group Details   200 mg 580 mL/hr over 30 Minutes Intravenous Once 02/15/20 2256 02/16/20 0239   02/15/20 2115  cefTRIAXone (ROCEPHIN) 2 g in sodium chloride 0.9 % 100 mL IVPB        2 g 200 mL/hr over 30 Minutes Intravenous Every 24 hours 02/15/20 2104     02/15/20 2115  azithromycin (ZITHROMAX) 500 mg in sodium chloride 0.9 % 250 mL IVPB        500 mg 250 mL/hr over 60 Minutes Intravenous Every 24 hours 02/15/20 2104         Subjective: Difficult to assess given mentation  Objective: Vitals:   02/16/20 1450 02/16/20 1455 02/16/20 1510 02/16/20 1611  BP: (!) 172/102 (!) 166/102 (!) 177/105 (!) 166/109  Pulse: 86 84 85 86  Resp:  (!) 24 (!) 22 20  Temp:   (!) 97.5 F (36.4 C) 98.7 F (37.1 C)  TempSrc:   Oral Oral  SpO2:    92%  Weight:    111.2 kg  Height:    5\' 4"  (1.626 m)    Intake/Output Summary (Last 24 hours) at 02/16/2020 1941 Last data filed at 02/16/2020 1510 Gross per 24 hour  Intake 694.87 ml  Output 0 ml  Net  694.87 ml   Filed Weights   02/16/20 0000 02/16/20 1611  Weight: 116.1 kg 111.2 kg    Examination:  General exam: Appears calm and comfortable  Respiratory system: No audible wheezing. Respiratory effort normal. Cardiovascular system: S1 & S2 heard, Regular Gastrointestinal system: Abdomen is nondistended, soft and nontender. No organomegaly or masses felt. Normal bowel sounds heard. Central nervous system: Alert and oriented. No focal neurological deficits. Extremities: Symmetric 5 x 5 power. Skin: No rashes, lesions  Psychiatry: Judgement and insight appear normal. Mood & affect appropriate.   Data Reviewed: I have personally reviewed following labs and imaging studies  CBC: Recent Labs  Lab 02/15/20 2043 02/15/20 2139 02/16/20 0643  WBC 4.9  --   --   HGB 10.4* 11.9*  10.2*  HCT 37.3 35.0* 30.0*  MCV 73.1*  --   --   PLT 76*  --   --    Basic Metabolic Panel: Recent Labs  Lab 02/15/20 2043 02/15/20 2139 02/16/20 0643 02/16/20 1815  NA 141 144 142 144  K 5.0 4.9 5.2* 4.8  CL 110  --   --  109  CO2 16*  --   --  21*  GLUCOSE 271*  --   --  283*  BUN 81*  --   --  83*  CREATININE 6.05*  --   --  5.83*  CALCIUM 7.5*  --   --  7.6*  MG 1.7  --   --   --   PHOS  --   --   --  5.1*   GFR: Estimated Creatinine Clearance: 10.3 mL/min (A) (by C-G formula based on SCr of 5.83 mg/dL (H)). Liver Function Tests: Recent Labs  Lab 02/15/20 2104 02/16/20 1815  AST 29  --   ALT 11  --   ALKPHOS 58  --   BILITOT 0.6  --   PROT 7.7  --   ALBUMIN 2.9* 2.7*   No results for input(s): LIPASE, AMYLASE in the last 168 hours. Recent Labs  Lab 02/16/20 0505  AMMONIA 21   Coagulation Profile: Recent Labs  Lab 02/15/20 2043  INR 1.2   Cardiac Enzymes: No results for input(s): CKTOTAL, CKMB, CKMBINDEX, TROPONINI in the last 168 hours. BNP (last 3 results) No results for input(s): PROBNP in the last 8760 hours. HbA1C: Recent Labs    02/16/20 0646  HGBA1C 7.4*   CBG: Recent Labs  Lab 02/16/20 0633 02/16/20 1235 02/16/20 1612  GLUCAP 321* 251* 246*   Lipid Profile: Recent Labs    02/16/20 0055  TRIG 158*   Thyroid Function Tests: Recent Labs    02/16/20 0505  TSH 0.758   Anemia Panel: Recent Labs    02/16/20 0053 02/16/20 0505  VITAMINB12  --  567  FERRITIN 919*  --    Sepsis Labs: Recent Labs  Lab 02/15/20 2104 02/15/20 2235 02/16/20 0053 02/16/20 0505  PROCALCITON  --   --  1.63 1.83  LATICACIDVEN 1.5 1.2  --   --     Recent Results (from the past 240 hour(s))  SARS Coronavirus 2 by RT PCR (hospital order, performed in [facility] hospital lab) Nasopharyngeal Nasopharyngeal Swab     Status: Abnormal   Collection Time: 02/15/20  8:44 PM   Specimen: Nasopharyngeal Swab  Result Value Ref Range Status   SARS  Coronavirus 2 POSITIVE (A) NEGATIVE Final    Comment: CRITICAL RESULT CALLED TO, READ BACK BY AND VERIFIED WITH: DR Gar Ponto. [phone]  FCP (NOTE) SARS-CoV-2 target nucleic acids are DETECTED  SARS-CoV-2 RNA is generally detectable in upper respiratory specimens  during the acute phase of infection.  Positive results are indicative  of the presence of the identified virus, but do not rule out bacterial infection or co-infection with other pathogens not detected by the test.  Clinical correlation with patient history and  other diagnostic information is necessary to determine patient infection status.  The expected result is negative.  Fact Sheet for Patients:   StrictlyIdeas.no   Fact Sheet for Healthcare Providers:   BankingDealers.co.za    This test is not yet approved or cleared by the [place] FDA and  has been authorized for detection and/or diagnosis of SARS-CoV-2 by FDA under an Emergency Use Authorization (EUA).  This EUA will remain in effect (meaning th is test can be used) for the duration of  the COVID-19 declaration under Section 564(b)(1) of the Act, 21 U.S.C. section 360-bbb-3(b)(1), unless the authorization is terminated or revoked sooner.  Performed at [facility], [place] [address] [place], [place] [address]   Blood Culture (routine x 2)     Status: None (Preliminary result)   Collection Time: 02/15/20  9:04 PM   Specimen: BLOOD  Result Value Ref Range Status   Specimen Description BLOOD SITE NOT SPECIFIED  Final   Special Requests   Final    BOTTLES DRAWN AEROBIC AND ANAEROBIC Blood Culture adequate volume   Culture   Final    NO GROWTH < 24 HOURS Performed at [facility], [place] [address], [place],  [address]    Report Status PENDING  Incomplete     Radiology Studies: CT HEAD WO CONTRAST  Result Date: 02/16/2020 CLINICAL DATA:  Altered mental status, somnolence EXAM: CT HEAD  WITHOUT CONTRAST TECHNIQUE: Contiguous axial images were obtained from the base of the skull through the vertex without intravenous contrast. COMPARISON:  Eight/19/19 FINDINGS: Brain: Normal anatomic configuration. Parenchymal volume loss is commensurate with the patient's age and stable since prior examination. Stable moderate periventricular white matter changes are present likely reflecting the sequela of small vessel ischemia. Tiny remote infarct within the a left cerebellar hemisphere is again noted. No abnormal intra or extra-axial mass lesion or fluid collection. No abnormal mass effect or midline shift. No evidence of acute intracranial hemorrhage or infarct. Ventricular size is normal. Cerebellum unremarkable. Vascular: No asymmetric hyperdense vasculature at the skull base. Skull: Intact Sinuses/Orbits: Mild mucosal thickening within the right maxillary sinus. Small layering mucus within the left sphenoid sinus. Remaining paranasal sinuses are clear. Orbits are unremarkable. Other: Mastoid air cells and middle ear cavities are clear. IMPRESSION: No acute intracranial abnormality. Stable senescent changes.  Remote tiny left cerebellar infarct. Minimal paranasal sinus disease. Electronically Signed   By: Fidela Salisbury MD   On: 02/16/2020 05:32   US RENAL  Result Date: 02/16/2020 CLINICAL DATA:  Acute renal injury. EXAM: RENAL / URINARY TRACT ULTRASOUND COMPLETE COMPARISON:  Ultrasound 03/19/2019. FINDINGS: Right Kidney: Renal measurements: 9.4 x 4.5 x 4.3 cm = volume: 93.6 mL. Mild increased echogenicity cannot be excluded. No mass or hydronephrosis visualized. Left Kidney: Renal measurements: 9.1 x 4.7 x 4.2 cm = volume: 94.8 mL. Mild increased echogenicity cannot be excluded. 3.6 cm simple cyst lower pole. No hydronephrosis visualized. Bladder: Appears normal for degree of bladder distention. Other: None. IMPRESSION: 1. Mild increased echogenicity both kidneys suggesting chronic renal disease cannot  be excluded. 2. 3.6 cm simple cyst lower pole left  kidney. No acute abnormality. No hydronephrosis. No bladder distention. Electronically Signed   By: Marcello Moores  Register   On: 02/16/2020 05:14   DG Chest Portable 1 View  Result Date: 02/15/2020 CLINICAL DATA:  75 year old female with shortness of breath. EXAM: PORTABLE CHEST 1 VIEW COMPARISON:  Chest radiograph dated 11/20/2017 and 10/06/2017. FINDINGS: Bilateral streaky and confluent densities, right greater left, most consistent with developing infiltrate, possibly viral or atypical in etiology. Clinical correlation is recommended. No large pleural effusion pneumothorax. Mild cardiomegaly. No acute osseous pathology. IMPRESSION: Findings most consistent with developing infiltrate predominantly involving the right lung. Electronically Signed   By: Anner Crete M.D.   On: 02/15/2020 21:02   VAS [place] LOWER EXTREMITY VENOUS (DVT) (ONLY [facility] & [facility])  Result Date: 02/16/2020  Lower Venous DVT Study Other Indications: D-Dimer. Risk Factors: None identified. Limitations: Body habitus and Pt unable to tolerate any pressure on legs. Comparison Study: 11/17 Left Negative Performing Technologist: Vonzell Schlatter RVT  Examination Guidelines: A complete evaluation includes B-mode imaging, spectral Doppler, color Doppler, and power Doppler as needed of all accessible portions of each vessel. Bilateral testing is considered an integral part of a complete examination. Limited examinations for reoccurring indications may be performed as noted. The reflux portion of the exam is performed with the patient in reverse Trendelenburg.  +-----+---------------+---------+-----------+----------+-------------------+ RIGHTCompressibilityPhasicitySpontaneityPropertiesThrombus Aging      +-----+---------------+---------+-----------+----------+-------------------+ CFV  Full           Yes      Yes                                       +-----+---------------+---------+-----------+----------+-------------------+ SFJ  Full                                                             +-----+---------------+---------+-----------+----------+-------------------+ POP  Full           Yes      Yes                                      +-----+---------------+---------+-----------+----------+-------------------+ PERO                                              Not well visualized +-----+---------------+---------+-----------+----------+-------------------+ Adequate Color fill in Femoral vein and posterior tibials. Patient unable to tolerate compression  +----+---------------+---------+-----------+----------+-------------------+ LEFTCompressibilityPhasicitySpontaneityPropertiesThrombus Aging      +----+---------------+---------+-----------+----------+-------------------+ CFV Full           Yes      Yes                                      +----+---------------+---------+-----------+----------+-------------------+ SFJ Full                                                             +----+---------------+---------+-----------+----------+-------------------+  POP Full           Yes      Yes                                      +----+---------------+---------+-----------+----------+-------------------+ PTV                                              Not well visualized +----+---------------+---------+-----------+----------+-------------------+ PERO                                             Not well visualized +----+---------------+---------+-----------+----------+-------------------+ Adequate color fill of Femoral vein due to patients inability to tolerate touch or compressions. Calf veins not seen  Summary: RIGHT: - There is no evidence of deep vein thrombosis in the lower extremity. However, portions of this examination were limited- see technologist comments above.  - No cystic structure found in  the popliteal fossa.  LEFT: - There is no evidence of deep vein thrombosis in the lower extremity. However, portions of this examination were limited- see technologist comments above.  - No cystic structure found in the popliteal fossa.  *See table(s) above for measurements and observations.    Preliminary     Scheduled Meds: . vitamin C  500 mg Oral Daily  . Chlorhexidine Gluconate Cloth  6 each Topical Q0600  . cholecalciferol  1,000 Units Oral Daily  . heparin injection (subcutaneous)  7,500 Units Subcutaneous Q8H  . insulin aspart  0-5 Units Subcutaneous QHS  . insulin aspart  0-6 Units Subcutaneous TID WC  . methylPREDNISolone (SOLU-MEDROL) injection  0.5 mg/kg Intravenous Q12H   Followed by  . [START ON 02/19/2020] predniSONE  50 mg Oral Daily  . zinc sulfate  220 mg Oral Daily   Continuous Infusions: . azithromycin Stopped (02/16/20 0054)  . [START ON 02/17/2020]  ceFAZolin (ANCEF) IV    . cefTRIAXone (ROCEPHIN)  IV Stopped (02/15/20 2314)  . remdesivir 100 mg in NS 100 mL Stopped (02/16/20 1240)     LOS: 1 day   Marylu Lund, MD [facility] Pager On Amion  If 7PM-7AM, please contact night-coverage 02/16/2020, 7:41 PM

## 2020-02-16 NOTE — ED Notes (Signed)
MD paged for diet order clarification.

## 2020-02-16 NOTE — Progress Notes (Signed)
ANTICOAGULATION CONSULT NOTE - Initial Consult  Pharmacy Consult for heparin Indication: suspected VTE in setting of Covid infection  Allergies  Allergen Reactions  . Morphine And Related Other (See Comments)    Family request not to be given, reports pt does not wake up when given   . Promethazine Hcl Other (See Comments)    REACTION: lethargy    Patient Measurements: Height: 5\' 4"  (162.6 cm) Weight: 116.1 kg (255 lb 15.3 oz) IBW/kg (Calculated) : 54.7 Heparin Dosing Weight: 85kg  Vital Signs: Temp: 101.8 F (38.8 C) (01/25 2101) Temp Source: Oral (01/25 2101) BP: 146/83 (01/25 2245) Pulse Rate: 96 (01/25 2245)  Labs: Recent Labs    02/15/20 2043 02/15/20 2104 02/15/20 2139 02/15/20 2243  HGB 10.4*  --  11.9*  --   HCT 37.3  --  35.0*  --   PLT 76*  --   --   --   APTT  --  30  --   --   LABPROT 14.4  --   --   --   INR 1.2  --   --   --   CREATININE 6.05*  --   --   --   TROPONINIHS 56*  --   --  56*    Estimated Creatinine Clearance: 10.2 mL/min (A) (by C-G formula based on SCr of 6.05 mg/dL (H)).   Medical History: Past Medical History:  Diagnosis Date  . Adenomatous colon polyp   . Allergy   . Anemia   . Asthma       . CAD (coronary artery disease)    Mild very minimal coronary disease with 20% obtuse marginal stenosis  . Carpal tunnel syndrome on left   . Cataract   . CHF (congestive heart failure) ([place])   . Chronic kidney disease (CKD), stage III (moderate) (HCC)    now stage 4  . COPD (chronic obstructive pulmonary disease) ([place])   . Depression   . 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 Diverticulosis   . Dyspnea   . 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 Esophageal dysmotility   . Esophageal stricture   . Gastritis   . Gastroparesis 08/21/2007  . GERD (gastroesophageal reflux disease)   . Hearing loss of both ears   . Hernia, hiatal   . Hyperlipidemia   . Hypertension   . Morbid obesity ([place])   . OSTEOARTHRITIS 08/09/2006   . Osteoporosis   . Oxygen deficiency    prn   . 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 RENAL INSUFFICIENCY 02/16/2009  . 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 Seizures ([place])    pt thinks it has been several monthes since she had a seisure  . Shingles   . Sickle cell trait ([place])   . Sleep apnea    uses cpap  . Stroke [facility])     Assessment: 75yo female c/o respiratory distress since Thursday, found to be Covid positive, concern for LLE DVT, to begin heparin while awaiting Doppler.  Goal of Therapy:  Heparin level 0.3-0.7 units/ml Monitor platelets by anticoagulation protocol: Yes   Plan:  Will give heparin 3000 units IV bolus x1 followed by gtt at 1300 units/hr and monitor heparin levels and CBC.  Wynona Neat, PharmD, BCPS  02/16/2020,12:26 AM

## 2020-02-16 NOTE — Progress Notes (Signed)
Bilateral lower extremity venous study completed.      Please see CV Proc for preliminary results.   Benjimin Hadden, RVT  

## 2020-02-16 NOTE — ED Notes (Signed)
Joann Gutierrez, daughter [phone]

## 2020-02-16 NOTE — H&P (Signed)
History and Physical    Joann Gutierrez GMW:[id number] DOB: 31-Aug-1945 DOA: 02/15/2020  PCP: Andree Moro, DO Patient coming from: Home  Chief Complaint: Shortness of breath  HPI: Joann Gutierrez is a 75 y.o. 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 When EMS arrived she was satting 67-70% on her home CPAP machine which seemed to have a poor seal. On EMS CPAP, sats improved to 93-95%. Blood pressure elevated at 180/100. Patient is currently somnolent, opens her eyes intermittently and then falls back asleep.  Not answering any questions.  ED Course: Patient initially required BiPAP but later satting well on 5 L oxygen via nasal cannula. Febrile with temperature 101.8 F. Slightly tachycardic on arrival. Tachypneic. Not hypotensive. WBC 4.9, hemoglobin 10.4 (at baseline), platelet count 76K. Sodium 141, potassium 5.0, chloride 110, bicarb 16, anion gap 15, BUN 81, creatinine 6.0 (was 3.9 on 01/20/2020), glucose 271. High-sensitivity troponin 56 >56. EKG not suggestive of ACS. INR 1.2. Magnesium 1.7. BNP normal. SARS-CoV-2 PCR test positive. Lactic acid 1.5. Blood culture x2 pending. LFTs normal. VBG with pH 7.28. Lactic acid 1.2. Procalcitonin 1.63. Inflammatory markers elevated: D-dimer 2.95, LDH 315, ferritin 919, fibrinogen 592, CRP 17.3.  Chest x-ray showing findings consistent with developing infiltrate predominantly involving the right lung.  ED provider spoke to Dr. Justin Mend from nephrology who did not feel that the patient needed emergent dialysis. Nephrology team will see the patient in the morning.  Patient was given Tylenol, IV Decadron 6 mg,  ceftriaxone, azithromycin, remdesivir, and started on heparin infusion.  Review of Systems:  All systems reviewed and apart from history of presenting illness, are negative.  Past Medical History:  Diagnosis Date  . Adenomatous colon polyp   . Allergy   . Anemia   . Asthma       . CAD (coronary artery disease)    Mild very minimal coronary disease with 20% obtuse marginal stenosis  . Carpal tunnel syndrome on left   . Cataract   . CHF (congestive heart failure) ([place])   . Chronic kidney disease (CKD), stage III (moderate) (HCC)    now stage 4  . COPD (chronic obstructive pulmonary disease) ([place])   . Depression   . 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 Diverticulosis   . Dyspnea   . 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 Esophageal dysmotility   . Esophageal stricture   . Gastritis   . Gastroparesis 08/21/2007  . GERD (gastroesophageal reflux disease)   . Hearing loss of both ears   . Hernia, hiatal   . Hyperlipidemia   . Hypertension   . Morbid obesity ([place])   . OSTEOARTHRITIS 08/09/2006  . Osteoporosis   . Oxygen deficiency    prn   . 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 RENAL INSUFFICIENCY 02/16/2009  . 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 Seizures ([place])    pt thinks it has been several monthes since she had a seisure  . Shingles   . Sickle cell trait ([place])   . Sleep apnea    uses cpap  . Stroke [facility])     Past Surgical History:  Procedure Laterality Date  . ABDOMINAL HYSTERECTOMY    .  ARTERY BIOPSY  01/07/2011   Procedure: MINOR BIOPSY TEMPORAL ARTERY;  Surgeon: Haywood Lasso, MD;  Location: [place];  Service: General;  Laterality: Left;  left temporal artery biopsy  . AV FISTULA PLACEMENT Right 11/16/2018   Procedure: CREATION RIGHT BRACHIOCEPHALIC FISTULA  ARTERIOVENOUS FISTULA;  Surgeon: Angelia Mould, MD;  Location: [place];  Service: Vascular;  Laterality: Right;  . bil foot surgery    . BREAST  LUMPECTOMY     benign  . BREAST LUMPECTOMY     both breast lumps removed   . CATARACT EXTRACTION Left 06/2016   Dr. Read Drivers  . COLONOSCOPY    . COLONOSCOPY WITH PROPOFOL N/A 07/05/2015   Procedure: COLONOSCOPY WITH PROPOFOL;  Surgeon: Manus Gunning, MD;  Location: [facility];  Service: Gastroenterology;  Laterality: N/A;  . COLONOSCOPY WITH PROPOFOL N/A 08/04/2018   Procedure: COLONOSCOPY WITH PROPOFOL;  Surgeon: Yetta Flock, MD;  Location: [facility];  Service: Gastroenterology;  Laterality: N/A;  . ERD  08/08/2000  . ESOPHAGEAL MANOMETRY N/A 03/13/2015   Procedure: ESOPHAGEAL MANOMETRY (EM);  Surgeon: Manus Gunning, MD;  Location: [facility];  Service: Gastroenterology;  Laterality: N/A;  . ESOPHAGOGASTRODUODENOSCOPY  06/25/2006  . ESOPHAGOGASTRODUODENOSCOPY (EGD) WITH PROPOFOL N/A 07/05/2015   Procedure: ESOPHAGOGASTRODUODENOSCOPY (EGD) WITH PROPOFOL;  Surgeon: Manus Gunning, MD;  Location: [facility];  Service: Gastroenterology;  Laterality: N/A;  . FISTULA SUPERFICIALIZATION Right 11/16/2018   Procedure: Fistula Superficialization;  Surgeon: Angelia Mould, MD;  Location: [place];  Service: Vascular;  Laterality: Right;  . HERNIA REPAIR    . LEFT AND RIGHT HEART CATHETERIZATION WITH CORONARY ANGIOGRAM N/A 03/03/2013   Procedure: LEFT AND RIGHT HEART CATHETERIZATION WITH CORONARY ANGIOGRAM;  Surgeon: Minus Breeding, MD;  Location: [facility] CATH LAB;  Service: Cardiovascular;  Laterality: N/A;  . REPLACEMENT TOTAL KNEE Left 1998  . UPPER GASTROINTESTINAL ENDOSCOPY       reports that she quit smoking about 39 years ago. She has a 5.00 pack-year smoking history. She has never used smokeless tobacco. She reports that she does not drink alcohol and does not use drugs.  Allergies  Allergen Reactions  . Morphine And Related Other (See Comments)    Family request not to be given, reports pt does not wake up when given   . Promethazine Hcl Other (See  Comments)    REACTION: lethargy    Family History  Problem Relation Age of Onset  . Liver cancer Mother        Liver Cancer  . Diabetes Mother   . Kidney disease Mother   . Heart disease Mother        age 62's  . Heart disease Father 34       MI  . Heart attack Father        died of MI when pt was 79  . Sickle cell anemia Father   . Colon cancer Brother   . Cancer Brother        Colon Cancer  . Diabetes Sister   . Kidney disease Sister   . Heart disease Sister        age 14's  . Allergies Sister   . Diabetes Sister   . Kidney disease Sister   . Heart disease Sister        age 66's  . Esophageal cancer Neg Hx   . Rectal cancer Neg Hx   . Stomach cancer Neg Hx   . Amblyopia Neg Hx   .  Blindness Neg Hx   . Glaucoma Neg Hx   . Macular degeneration Neg Hx   . Retinal detachment Neg Hx   . Cataracts Neg Hx   . Strabismus Neg Hx   . Retinitis pigmentosa Neg Hx     Prior to Admission medications   Medication Sig Start Date End Date Taking? Authorizing Provider  albuterol (VENTOLIN HFA) 108 (90 Base) MCG/ACT inhaler Inhale 2 puffs into the lungs every 6 (six) hours as needed for wheezing or shortness of breath. 11/15/19  Yes Tanda Rockers, MD  aspirin EC 81 MG tablet Take 81 mg by mouth daily.   Yes [provider]  Fluticasone-Umeclidin-Vilant (TRELEGY ELLIPTA) 100-62.5-25 MCG/INH AEPB Take 1 puff by mouth daily. 04/13/19  Yes Lauraine Rinne, NP  gabapentin (NEURONTIN) 100 MG capsule Take 1 capsule (100 mg total) by mouth 2 (two) times daily. Patient taking differently: Take 100 mg by mouth at bedtime. 09/12/17  Yes Hongalgi, Lenis Dickinson, MD  hydrALAZINE (APRESOLINE) 100 MG tablet Take 1 tablet by mouth 3 (three) times daily. 05/10/19  Yes [provider]  LEVEMIR FLEXTOUCH 100 UNIT/ML Pen Inject 18 Units into the skin daily. Patient taking differently: Inject 30 Units into the skin daily. 09/13/17  Yes Hongalgi, Lenis Dickinson, MD  losartan (COZAAR) 100 MG tablet Take  100 mg by mouth daily.   Yes [provider]  montelukast (SINGULAIR) 10 MG tablet Take 1 tablet (10 mg total) by mouth daily. Patient taking differently: Take 10 mg by mouth at bedtime. 10/22/17  Yes Lauraine Rinne, NP  nitroGLYCERIN (NITROSTAT) 0.4 MG SL tablet PLACE ONE TABLET UNDER THE TONGUE EVERY FIVE MINUTES AS NEEDED FOR CHEST PAIN Patient taking differently: Place 0.4 mg under the tongue every 5 (five) minutes as needed for chest pain. 07/08/16  Yes Hochrein, Jeneen Rinks, MD  NOVOLOG FLEXPEN 100 UNIT/ML FlexPen INJECT 5 UNITS SUBCUTANEOUSLY 3 TIMES DAILY AS DIRECTED SLIDING SCALE 06/16/18  Yes [provider]  promethazine (PHENERGAN) 12.5 MG tablet Take 1 tablet by mouth daily as needed for nausea or vomiting. 05/06/19  Yes [provider]  amLODipine (NORVASC) 10 MG tablet Take 10 mg by mouth every evening. 01/09/19   [provider]  azelastine (ASTELIN) 0.1 % nasal spray Place 1 spray into both nostrils 2 (two) times daily as needed (allergies). Use in each nostril as directed 11/10/19   [name], MD  calcium carbonate (OSCAL) 1500 (600 Ca) MG TABS tablet Take 1 tablet by mouth daily.    [provider]  carvedilol (COREG) 25 MG tablet Take 25 mg by mouth 2 (two) times daily. 01/09/19   [provider]  donepezil (ARICEPT) 10 MG tablet Take 10 mg by mouth at bedtime.    [provider]  ezetimibe (ZETIA) 10 MG tablet Take 10 mg by mouth daily.    [provider]  ferrous sulfate 325 (65 FE) MG tablet Take 325 mg by mouth daily.    [provider]  furosemide (LASIX) 80 MG tablet Take 160 mg by mouth 2 (two) times daily.     [provider]  ketorolac (ACULAR) 0.4 % SOLN Place 1 drop into the left eye 2 (two) times a day.    [provider]  Misc Natural Products (NEURIVA PO) Take 1 capsule by mouth daily.    [provider]  omeprazole (PRILOSEC) 40 MG capsule Take 1 capsule (40 mg  total) by mouth in the morning and at bedtime. Please schedule  a yearly follow up: [id number]. Thank you 09/01/19   Yetta Flock, MD  OXYGEN Inhale 2 L into the lungs as needed. CPAP with oxygen at bedtime    [provider]  polyethylene glycol (MIRALAX) 17 g packet Take 17 g by mouth daily as needed. Patient taking differently: Take 17 g by mouth daily as needed (constipation.). 06/18/18   Armbruster, Carlota Raspberry, MD  raloxifene (EVISTA) 60 MG tablet Take 60 mg by mouth every evening.    [provider]  spironolactone (ALDACTONE) 25 MG tablet Take 25 mg by mouth daily. 01/06/19   [provider]  TRUEPLUS PEN NEEDLES 32G X 4 MM MISC INJECT THREE TIMES A DAY AS DIRECTED 07/08/17   [provider]  Vitamin D, Ergocalciferol, (DRISDOL) 50000 UNITS CAPS capsule Take 50,000 Units by mouth every Friday.     [provider]    Physical Exam: Vitals:   02/15/20 2104 02/15/20 2245 02/16/20 0000 02/16/20 0245  BP:  (!) 146/83  139/70  Pulse: 97 96  81  Resp: (!) 33 (!) 29  (!) 23  Temp:      TempSrc:      SpO2: 100% 97%  98%  Weight:   116.1 kg   Height:   5\' 4"  (1.626 m)     Physical Exam Constitutional:      General: She is not in acute distress. HENT:     Head: Normocephalic and atraumatic.  Eyes:     Conjunctiva/sclera: Conjunctivae normal.  Cardiovascular:     Rate and Rhythm: Normal rate and regular rhythm.     Pulses: Normal pulses.  Pulmonary:     Effort: Pulmonary effort is normal. No respiratory distress.     Breath sounds: No wheezing.     Comments: Satting well on 5 L supplemental oxygen Abdominal:     General: Bowel sounds are normal. There is no distension.     Palpations: Abdomen is soft.     Tenderness: There is no abdominal tenderness.  Musculoskeletal:        General: Swelling present.     Cervical back: Normal range of motion and neck supple.     Comments: Unilateral left lower extremity edema  Skin:    General:  Skin is warm and dry.  Neurological:     Mental Status: She is alert.     Comments: Somnolent, opens eyes intermittently and then falls back asleep Not answering any questions or following any commands     Labs on Admission: I have personally reviewed following labs and imaging studies  CBC: Recent Labs  Lab 02/15/20 2043 02/15/20 2139  WBC 4.9  --   HGB 10.4* 11.9*  HCT 37.3 35.0*  MCV 73.1*  --   PLT 76*  --    Basic Metabolic Panel: Recent Labs  Lab 02/15/20 2043 02/15/20 2139  NA 141 144  K 5.0 4.9  CL 110  --   CO2 16*  --   GLUCOSE 271*  --   BUN 81*  --   CREATININE 6.05*  --   CALCIUM 7.5*  --   MG 1.7  --    GFR: Estimated Creatinine Clearance: 10.2 mL/min (A) (by C-G formula based on SCr of 6.05 mg/dL (H)). Liver Function Tests: Recent Labs  Lab 02/15/20 2104  AST 29  ALT 11  ALKPHOS 58  BILITOT 0.6  PROT 7.7  ALBUMIN 2.9*   No results for input(s): LIPASE, AMYLASE in the last 168 hours.  No results for input(s): AMMONIA in the last 168 hours. Coagulation Profile: Recent Labs  Lab 02/15/20 2043  INR 1.2   Cardiac Enzymes: No results for input(s): CKTOTAL, CKMB, CKMBINDEX, TROPONINI in the last 168 hours. BNP (last 3 results) No results for input(s): PROBNP in the last 8760 hours. HbA1C: No results for input(s): HGBA1C in the last 72 hours. CBG: No results for input(s): GLUCAP in the last 168 hours. Lipid Profile: Recent Labs    02/16/20 0055  TRIG 158*   Thyroid Function Tests: No results for input(s): TSH, T4TOTAL, FREET4, T3FREE, THYROIDAB in the last 72 hours. Anemia Panel: Recent Labs    02/16/20 0053  FERRITIN 919*   Urine analysis:    Component Value Date/Time   COLORURINE YELLOW 09/07/2017 0857   APPEARANCEUR CLOUDY (A) 09/07/2017 0857   LABSPEC 1.019 09/07/2017 0857   PHURINE 5.0 09/07/2017 0857   GLUCOSEU NEGATIVE 09/07/2017 0857   GLUCOSEU >=1000 01/02/2009 1101   HGBUR NEGATIVE 09/07/2017 0857   HGBUR  negative 03/25/2008 1022   BILIRUBINUR NEGATIVE 09/07/2017 0857   KETONESUR NEGATIVE 09/07/2017 0857   PROTEINUR 100 (A) 09/07/2017 0857   UROBILINOGEN 0.2 09/14/2013 1632   NITRITE NEGATIVE 09/07/2017 0857   LEUKOCYTESUR NEGATIVE 09/07/2017 0857    Radiological Exams on Admission: DG Chest Portable 1 View  Result Date: 02/15/2020 CLINICAL DATA:  75 year old female with shortness of breath. EXAM: PORTABLE CHEST 1 VIEW COMPARISON:  Chest radiograph dated 11/20/2017 and 10/06/2017. FINDINGS: Bilateral streaky and confluent densities, right greater left, most consistent with developing infiltrate, possibly viral or atypical in etiology. Clinical correlation is recommended. No large pleural effusion pneumothorax. Mild cardiomegaly. No acute osseous pathology. IMPRESSION: Findings most consistent with developing infiltrate predominantly involving the right lung. Electronically Signed   By: Anner Crete M.D.   On: 02/15/2020 21:02    EKG: Independently reviewed. Sinus tachycardia, no significant change since prior tracing.  Assessment/Plan Principal Problem:   Pneumonia due to COVID-19 virus Active Problems:   AKI (acute kidney injury) ([place])   Sepsis ([place])   Acute on chronic respiratory failure with hypoxia (HCC)   Edema of left lower leg   Sepsis and acute on chronic hypoxemic respiratory failure secondary to COVID-19 viral pneumonia: Meets criteria for sepsis-2 SIRS (fever, tachycardia, tachypnea) and SARS-CoV-2 PCR test positive. No hypotension or lactic acidosis to suggest severe sepsis. Chest x-ray showing findings consistent with developing infiltrate predominantly involving the right lung. Inflammatory markers elevated: D-dimer 2.95, LDH 315, ferritin 919, fibrinogen 592, CRP 17.3. Initially required BiPAP but now satting well on 5 L supplemental oxygen. -Remdesivir -IV Solu-Medrol 0.5 mg/kg every 12 hours -Given elevated procalcitonin of 1.63, will continue antibiotics for coverage  of bacterial pneumonia as well. -Vitamin C, zinc, vitamin D -Antitussive as needed -Tylenol as needed -Bronchodilator as needed -Check inflammatory markers including ferritin, fibrinogen, D-dimer, CRP, LDH -Check procalcitonin level -Daily CBC with differential, CMP, CRP, D-dimer, procalcitonin -Airborne and contact precautions -Incentive spirometry, flutter valve -Encourage prone positioning -Continuous pulse ox -Supplemental oxygen as needed to keep oxygen saturation above 90% -Blood culture x2 pending  AKI on CKD stage V: Patient has an AV graft in place. BUN 81, creatinine 6.0 (was 3.9 on 01/20/2020). Likely prerenal from dehydration in the setting of acute viral illness in the setting of home diuretic and ARB. Patient still makes urine. -IV fluid hydration. Monitor renal function and urine output. Order renal ultrasound. Check urine sodium and creatinine. Avoid nephrotoxic agents/contrast, hold Lasix, spironolactone, and losartan. Nephrology will  consult in the morning.  Metabolic acidosis: Bicarb 16, anion gap 15. Likely due to AKI on CKD stage V. VBG with pH within normal range. -Bicarb supplementation, continue to monitor  Left lower extremity edema/concern for DVT: She has unilateral left lower extremity edema and D-dimer elevated in the setting of Covid infection. -Left lower extremity Doppler ordered to rule out DVT. Continue unfractionated heparin for anticoagulation. Trend D-dimer, if it continues to increase, will need VQ scan to assess for PE as well.  Acute encephalopathy: Patient is currently somnolent, opens eyes intermittently and falls back asleep.  Not answer any questions or following any commands.  She does have dementia and unclear what her baseline is.  Per triage note, she was AAO x2 initially.  Suspecting encephalopathy is multifactorial in the setting of acute viral illness, hypoxemia, dehydration, AKI/uremia. -Stat head CT, stat ABG, recheck blood glucose level.   Check ammonia, TSH, and B12 levels.  Elevated troponin: High-sensitivity troponin slightly elevated but stable (56 > 56). EKG without acute ischemic changes. Troponin elevation likely due to demand ischemia in setting of Covid pneumonia. -Cardiac monitoring  Insulin-dependent diabetes mellitus -Check A1c. Sliding scale insulin very sensitive ACHS. Resume home basal insulin after pharmacy med rec is done.  Thrombocytopenia: Platelet count currently 76K, was 121K on 01/20/2020 but there are no other prior labs available for comparison. Thrombocytopenia likely related to chronic renal disease and now worse in the setting of acute viral illness. -Continue to monitor platelet count as patient is currently on heparin due to concern for DVT  COPD: Stable. No wheezing. -Resume home inhalers after pharmacy med rec is done.  Pharmacy med rec pending.  DVT prophylaxis: Heparin Code Status: Full code Family Communication: No family available this time. Disposition Plan: Status is: Inpatient  Remains inpatient appropriate because:IV treatments appropriate due to intensity of illness or inability to take PO and Inpatient level of care appropriate due to severity of illness   Dispo: The patient is from: Home              Anticipated d/c is to: Home              Anticipated d/c date is: 3 days              Patient currently is not medically stable to d/c.   Difficult to place patient No  Level of care: Level of care: Progressive The medical decision making on this patient was of high complexity and the patient is at high risk for clinical deterioration, therefore this is a level 3 visit.  Shela Leff MD [facility]  If 7PM-7AM, please contact night-coverage [email or web address]  02/16/2020, 4:28 AM

## 2020-02-17 ENCOUNTER — Inpatient Hospital Stay (HOSPITAL_COMMUNITY): Payer: Medicare HMO

## 2020-02-17 ENCOUNTER — Encounter (HOSPITAL_COMMUNITY): Payer: Medicare HMO

## 2020-02-17 DIAGNOSIS — J9621 Acute and chronic respiratory failure with hypoxia: Secondary | ICD-10-CM | POA: Diagnosis not present

## 2020-02-17 DIAGNOSIS — J1282 Pneumonia due to coronavirus disease 2019: Secondary | ICD-10-CM | POA: Diagnosis not present

## 2020-02-17 DIAGNOSIS — N179 Acute kidney failure, unspecified: Secondary | ICD-10-CM | POA: Diagnosis not present

## 2020-02-17 DIAGNOSIS — U071 COVID-19: Secondary | ICD-10-CM | POA: Diagnosis not present

## 2020-02-17 HISTORY — PX: IR PERC TUN PERIT CATH WO PORT S&I /IMAG: IMG2327

## 2020-02-17 LAB — GLUCOSE, CAPILLARY
Glucose-Capillary: 314 mg/dL — ABNORMAL HIGH (ref 70–99)
Glucose-Capillary: 163 mg/dL — ABNORMAL HIGH (ref 70–99)
Glucose-Capillary: 144 mg/dL — ABNORMAL HIGH (ref 70–99)
Glucose-Capillary: 158 mg/dL — ABNORMAL HIGH (ref 70–99)

## 2020-02-17 LAB — CBC WITH DIFFERENTIAL/PLATELET
Abs Immature Granulocytes: 0.14 10*3/uL — ABNORMAL HIGH (ref 0.00–0.07)
Basophils Absolute: 0 10*3/uL (ref 0.0–0.1)
Basophils Relative: 0 %
Eosinophils Absolute: 0 10*3/uL (ref 0.0–0.5)
Eosinophils Relative: 0 %
HCT: 28.1 % — ABNORMAL LOW (ref 36.0–46.0)
Hemoglobin: 8.5 g/dL — ABNORMAL LOW (ref 12.0–15.0)
Immature Granulocytes: 3 %
Lymphocytes Relative: 17 %
Lymphs Abs: 0.7 10*3/uL (ref 0.7–4.0)
MCH: 20.6 pg — ABNORMAL LOW (ref 26.0–34.0)
MCHC: 30.2 g/dL (ref 30.0–36.0)
MCV: 68.2 fL — ABNORMAL LOW (ref 80.0–100.0)
Monocytes Absolute: 0.7 10*3/uL (ref 0.1–1.0)
Monocytes Relative: 16 %
Neutro Abs: 2.6 10*3/uL (ref 1.7–7.7)
Neutrophils Relative %: 64 %
Platelets: 88 10*3/uL — ABNORMAL LOW (ref 150–400)
RBC: 4.12 MIL/uL (ref 3.87–5.11)
RDW: 20.7 % — ABNORMAL HIGH (ref 11.5–15.5)
WBC: 4.2 10*3/uL (ref 4.0–10.5)
nRBC: 0 % (ref 0.0–0.2)

## 2020-02-17 LAB — D-DIMER, QUANTITATIVE: D-Dimer, Quant: 2.14 ug/mL-FEU — ABNORMAL HIGH (ref 0.00–0.50)

## 2020-02-17 LAB — COMPREHENSIVE METABOLIC PANEL
ALT: 13 U/L (ref 0–44)
AST: 26 U/L (ref 15–41)
Albumin: 2.3 g/dL — ABNORMAL LOW (ref 3.5–5.0)
Alkaline Phosphatase: 46 U/L (ref 38–126)
Anion gap: 14 (ref 5–15)
BUN: 92 mg/dL — ABNORMAL HIGH (ref 8–23)
CO2: 18 mmol/L — ABNORMAL LOW (ref 22–32)
Calcium: 7 mg/dL — ABNORMAL LOW (ref 8.9–10.3)
Chloride: 111 mmol/L (ref 98–111)
Creatinine, Ser: 6.24 mg/dL — ABNORMAL HIGH (ref 0.44–1.00)
GFR, Estimated: 7 mL/min — ABNORMAL LOW (ref >60)
Glucose, Bld: 344 mg/dL — ABNORMAL HIGH (ref 70–99)
Potassium: 4.5 mmol/L (ref 3.5–5.1)
Sodium: 143 mmol/L (ref 135–145)
Total Bilirubin: 0.5 mg/dL (ref 0.3–1.2)
Total Protein: 6.3 g/dL — ABNORMAL LOW (ref 6.5–8.1)

## 2020-02-17 LAB — PROCALCITONIN: Procalcitonin: 2.17 ng/mL

## 2020-02-17 LAB — IRON AND TIBC
Iron: 57 ug/dL (ref 28–170)
Saturation Ratios: 52 % — ABNORMAL HIGH (ref 10.4–31.8)
TIBC: 111 ug/dL — ABNORMAL LOW (ref 250–450)
UIBC: 54 ug/dL

## 2020-02-17 LAB — C-REACTIVE PROTEIN: CRP: 19.9 mg/dL — ABNORMAL HIGH (ref <1.0)

## 2020-02-17 MED ORDER — MIDAZOLAM HCL 2 MG/2ML IJ SOLN
INTRAMUSCULAR | Status: AC
  Filled 2020-02-17: qty 2

## 2020-02-17 MED ORDER — LIDOCAINE HCL 1 % IJ SOLN
INTRAMUSCULAR | Status: AC
  Filled 2020-02-17: qty 20

## 2020-02-17 MED ORDER — FENTANYL CITRATE (PF) 100 MCG/2ML IJ SOLN
INTRAMUSCULAR | Status: AC
  Filled 2020-02-17: qty 2

## 2020-02-17 MED ORDER — GELATIN ABSORBABLE 12-7 MM EX MISC
CUTANEOUS | Status: AC
  Filled 2020-02-17: qty 1

## 2020-02-17 MED ORDER — CHLORHEXIDINE GLUCONATE CLOTH 2 % EX PADS
6.0000 | MEDICATED_PAD | Freq: Every day | CUTANEOUS | Status: DC
  Administered 2020-02-18 – 2020-02-23 (×6): 6 via TOPICAL

## 2020-02-17 MED ORDER — MIDAZOLAM HCL 2 MG/2ML IJ SOLN
INTRAMUSCULAR | Status: AC | PRN
  Administered 2020-02-17: 1 mg via INTRAVENOUS

## 2020-02-17 MED ORDER — CEFAZOLIN SODIUM-DEXTROSE 2-4 GM/100ML-% IV SOLN
INTRAVENOUS | Status: AC
  Administered 2020-02-17: 2 g via INTRAVENOUS
  Filled 2020-02-17: qty 100

## 2020-02-17 MED ORDER — HEPARIN SODIUM (PORCINE) 1000 UNIT/ML IJ SOLN
INTRAMUSCULAR | Status: AC
  Filled 2020-02-17: qty 1

## 2020-02-17 MED ORDER — FENTANYL CITRATE (PF) 100 MCG/2ML IJ SOLN
INTRAMUSCULAR | Status: AC | PRN
  Administered 2020-02-17: 25 ug via INTRAVENOUS

## 2020-02-17 NOTE — Progress Notes (Signed)
Olen Pel, RN called and notified Pt's HD tx has been moved to 02/18/2020.

## 2020-02-17 NOTE — Progress Notes (Signed)
[facility] Progress Note   Subjective:   AVF infiltrated yesterday on start of HD.  Getting TDC today.  No new issues.    Objective Vitals:   02/16/20 2357 02/17/20 0440 02/17/20 0854 02/17/20 1138  BP: 104/67 121/62 124/68 (!) 118/56  Pulse: 77 73 76 74  Resp: 20 20 20 20   Temp: 98.4 F (36.9 C) 98.6 F (37 C) (!) 97.5 F (36.4 C) 98.1 F (36.7 C)  TempSrc: Oral Oral Oral Oral  SpO2: 93% 92% 93% 92%  Weight:      Height:       Physical Exam General: awake today, looking around Heart: RRR Lungs: coarse BL, no rales Abdomen: soft Extremities: minor edema Dialysis Access:  RUE AVF + infiltration venous; +t/b present  Additional Objective Labs: Basic Metabolic Panel: Recent Labs  Lab 02/15/20 2043 02/15/20 2139 02/16/20 0643 02/16/20 1815 02/17/20 0219  NA 141   < > 142 144 143  K 5.0   < > 5.2* 4.8 4.5  CL 110  --   --  109 111  CO2 16*  --   --  21* 18*  GLUCOSE 271*  --   --  283* 344*  BUN 81*  --   --  83* 92*  CREATININE 6.05*  --   --  5.83* 6.24*  CALCIUM 7.5*  --   --  7.6* 7.0*  PHOS  --   --   --  5.1*  --    < > = values in this interval not displayed.   Liver Function Tests: Recent Labs  Lab 02/15/20 2104 02/16/20 1815 02/17/20 0219  AST 29  --  26  ALT 11  --  13  ALKPHOS 58  --  46  BILITOT 0.6  --  0.5  PROT 7.7  --  6.3*  ALBUMIN 2.9* 2.7* 2.3*   No results for input(s): LIPASE, AMYLASE in the last 168 hours. CBC: Recent Labs  Lab 02/15/20 2043 02/15/20 2139 02/16/20 0643 02/16/20 1815 02/17/20 0219  WBC 4.9  --   --  4.6 4.2  NEUTROABS  --   --   --   --  2.6  HGB 10.4*   < > 10.2* 10.3* 8.5*  HCT 37.3   < > 30.0* 33.9* 28.1*  MCV 73.1*  --   --  69.3* 68.2*  PLT 76*  --   --  84* 88*   < > = values in this interval not displayed.   Blood Culture    Component Value Date/Time   SDES IN/OUT CATH URINE 02/16/2020 1119   SPECREQUEST NONE 02/16/2020 1119   CULT (A) 02/16/2020 1119    20,000 COLONIES/mL  ESCHERICHIA COLI SUSCEPTIBILITIES TO FOLLOW Performed at [facility] [address] [place], [place] [address]    REPTSTATUS PENDING 02/16/2020 1119    Cardiac Enzymes: No results for input(s): CKTOTAL, CKMB, CKMBINDEX, TROPONINI in the last 168 hours. CBG: Recent Labs  Lab 02/16/20 1235 02/16/20 1612 02/16/20 2056 02/17/20 0603 02/17/20 1134  GLUCAP 251* 246* 247* 314* 163*   Iron Studies:  Recent Labs    02/16/20 0053  FERRITIN 919*   @[email or web address] @ Studies/Results: CT HEAD WO CONTRAST  Result Date: 02/16/2020 CLINICAL DATA:  Altered mental status, somnolence EXAM: CT HEAD WITHOUT CONTRAST TECHNIQUE: Contiguous axial images were obtained from the base of the skull through the vertex without intravenous contrast. COMPARISON:  Eight/19/19 FINDINGS: Brain: Normal anatomic configuration. Parenchymal volume loss is commensurate with  the patient's age and stable since prior examination. Stable moderate periventricular white matter changes are present likely reflecting the sequela of small vessel ischemia. Tiny remote infarct within the a left cerebellar hemisphere is again noted. No abnormal intra or extra-axial mass lesion or fluid collection. No abnormal mass effect or midline shift. No evidence of acute intracranial hemorrhage or infarct. Ventricular size is normal. Cerebellum unremarkable. Vascular: No asymmetric hyperdense vasculature at the skull base. Skull: Intact Sinuses/Orbits: Mild mucosal thickening within the right maxillary sinus. Small layering mucus within the left sphenoid sinus. Remaining paranasal sinuses are clear. Orbits are unremarkable. Other: Mastoid air cells and middle ear cavities are clear. IMPRESSION: No acute intracranial abnormality. Stable senescent changes.  Remote tiny left cerebellar infarct. Minimal paranasal sinus disease. Electronically Signed   By: Fidela Salisbury MD   On: 02/16/2020 05:32   US RENAL  Result Date: 02/16/2020 CLINICAL DATA:   Acute renal injury. EXAM: RENAL / URINARY TRACT ULTRASOUND COMPLETE COMPARISON:  Ultrasound 03/19/2019. FINDINGS: Right Kidney: Renal measurements: 9.4 x 4.5 x 4.3 cm = volume: 93.6 mL. Mild increased echogenicity cannot be excluded. No mass or hydronephrosis visualized. Left Kidney: Renal measurements: 9.1 x 4.7 x 4.2 cm = volume: 94.8 mL. Mild increased echogenicity cannot be excluded. 3.6 cm simple cyst lower pole. No hydronephrosis visualized. Bladder: Appears normal for degree of bladder distention. Other: None. IMPRESSION: 1. Mild increased echogenicity both kidneys suggesting chronic renal disease cannot be excluded. 2. 3.6 cm simple cyst lower pole left kidney. No acute abnormality. No hydronephrosis. No bladder distention. Electronically Signed   By: Marcello Moores  Register   On: 02/16/2020 05:14   DG Chest Portable 1 View  Result Date: 02/15/2020 CLINICAL DATA:  75 year old female with shortness of breath. EXAM: PORTABLE CHEST 1 VIEW COMPARISON:  Chest radiograph dated 11/20/2017 and 10/06/2017. FINDINGS: Bilateral streaky and confluent densities, right greater left, most consistent with developing infiltrate, possibly viral or atypical in etiology. Clinical correlation is recommended. No large pleural effusion pneumothorax. Mild cardiomegaly. No acute osseous pathology. IMPRESSION: Findings most consistent with developing infiltrate predominantly involving the right lung. Electronically Signed   By: Anner Crete M.D.   On: 02/15/2020 21:02   VAS [place] LOWER EXTREMITY VENOUS (DVT) (ONLY [facility] & [facility])  Result Date: 02/16/2020  Lower Venous DVT Study Other Indications: D-Dimer. Risk Factors: None identified. Limitations: Body habitus and Pt unable to tolerate any pressure on legs. Comparison Study: 11/17 Left Negative Performing Technologist: Vonzell Schlatter RVT  Examination Guidelines: A complete evaluation includes B-mode imaging, spectral Doppler, color Doppler, and power Doppler as needed of all accessible  portions of each vessel. Bilateral testing is considered an integral part of a complete examination. Limited examinations for reoccurring indications may be performed as noted. The reflux portion of the exam is performed with the patient in reverse Trendelenburg.  +-----+---------------+---------+-----------+----------+-------------------+ RIGHTCompressibilityPhasicitySpontaneityPropertiesThrombus Aging      +-----+---------------+---------+-----------+----------+-------------------+ CFV  Full           Yes      Yes                                      +-----+---------------+---------+-----------+----------+-------------------+ SFJ  Full                                                             +-----+---------------+---------+-----------+----------+-------------------+  POP  Full           Yes      Yes                                      +-----+---------------+---------+-----------+----------+-------------------+ PERO                                              Not well visualized +-----+---------------+---------+-----------+----------+-------------------+ Adequate Color fill in Femoral vein and posterior tibials. Patient unable to tolerate compression  +----+---------------+---------+-----------+----------+-------------------+ LEFTCompressibilityPhasicitySpontaneityPropertiesThrombus Aging      +----+---------------+---------+-----------+----------+-------------------+ CFV Full           Yes      Yes                                      +----+---------------+---------+-----------+----------+-------------------+ SFJ Full                                                             +----+---------------+---------+-----------+----------+-------------------+ POP Full           Yes      Yes                                      +----+---------------+---------+-----------+----------+-------------------+ PTV                                               Not well visualized +----+---------------+---------+-----------+----------+-------------------+ PERO                                             Not well visualized +----+---------------+---------+-----------+----------+-------------------+ Adequate color fill of Femoral vein due to patients inability to tolerate touch or compressions. Calf veins not seen  Summary: RIGHT: - There is no evidence of deep vein thrombosis in the lower extremity. However, portions of this examination were limited- see technologist comments above.  - No cystic structure found in the popliteal fossa.  LEFT: - There is no evidence of deep vein thrombosis in the lower extremity. However, portions of this examination were limited- see technologist comments above.  - No cystic structure found in the popliteal fossa.  *See table(s) above for measurements and observations. Electronically signed by Harold Barban MD on 02/16/2020 at 9:40:57 PM.    Final    Medications: . azithromycin 500 mg (02/16/20 2323)  .  ceFAZolin (ANCEF) IV    . cefTRIAXone (ROCEPHIN)  IV 2 g (02/16/20 2159)  . remdesivir 100 mg in NS 100 mL 100 mg (02/17/20 0936)   . amLODipine  10 mg Oral QPM  . vitamin C  500 mg Oral Daily  . aspirin EC  81 mg Oral Daily  . carvedilol  25 mg Oral BID  .  Chlorhexidine Gluconate Cloth  6 each Topical Q0600  . cholecalciferol  1,000 Units Oral Daily  . heparin injection (subcutaneous)  7,500 Units Subcutaneous Q8H  . hydrALAZINE  100 mg Oral TID  . insulin aspart  0-5 Units Subcutaneous QHS  . insulin aspart  0-6 Units Subcutaneous TID WC  . insulin aspart  5 Units Subcutaneous TID WC  . insulin detemir  18 Units Subcutaneous Q2200  . methylPREDNISolone (SOLU-MEDROL) injection  0.5 mg/kg Intravenous Q12H   Followed by  . [START ON 02/19/2020] predniSONE  50 mg Oral Daily  . zinc sulfate  220 mg Oral Daily   Assessment/Plan **Hypoxic respiratory failure:  COVID+, being treated per primary with remdesivir and  steroids.  Ok on 5L currently. Does have underlying COPD as well.  **AKI on CKD 5:  Was having some uremic symptoms even last month but chose to manage expectantly, now quite ill with COVID and Cr up to 6.  No UOP with fluid challenge.  Initiating HD - AVF infiltrated 1/26 now awaiting [facility] placement with HD #1 today.   **AMS:  Suspect multifactorial but uremia may be playing a role, dialysis per above.   **DM: per primary, A1c 7.2  **LLE edema: DVT ruled out  **Anemia:  Hb 10.2 yest AM, this AM 8.5.  Check iron indices.  No obvious bleeding ? Dilutional?.  Will follow closely  Jannifer Hick MD 02/17/2020, 1:15 PM  [facility] Pager: [phone]

## 2020-02-17 NOTE — Procedures (Signed)
Interventional Radiology Procedure Note  Procedure: Tunneled HD catheter placement  Access: Right IJ vein  Complications: None  Estimated Blood Loss: < 10 mL  Findings: 19 cm tip to cuff length Palindrome tunneled HD catheter via right IJ with tip in RA. OK to use.  Venetia Night. Kathlene Cote, M.D Pager:  [phone]

## 2020-02-17 NOTE — Progress Notes (Signed)
This chaplain responded to RN Tori referral for [facility] assistance.  The chaplain understands the Pt. daughter-Sheniah is interested in becoming the Pt. HCPOA.  The chaplain phoned [name] at the number in Patient Contacts. [name] accepted [employer] education for herself, with the understanding the Pt. will need the same education because of the legality of the document.  [name] shares the Pt. is diagnosed with "dementia x2."   The chaplain shared because of lack of a notary, Covid visitor restrictions, and the Pt. onset of dementia, the chaplain will not be able to complete HCPOA today and will need F/U at a later date.  The chaplain understands from [place], there is uncertainty of whether the Pt. sister received guardianship or power of attorney of the Pt.   If neither has occurred the Pt. daughter-Sheniah will share the role of surrogate decision maker for medical decisions with her siblings following the [place] [employer].   [name] verified her contact information in Epic with the chaplain and accepted a referral to the unit Chaplain Vincella for F/U.

## 2020-02-17 NOTE — Consult Note (Signed)
Chief Complaint: Patient was seen in consultation today for tunneled hemodialysis catheter placement Chief Complaint  Patient presents with  . Shortness of Breath  . Weakness    Referring Physician(s): Leanora Cover  Supervising Physician: Aletta Edouard  Patient Status: [facility] - In-pt  History of Present Illness: Joann Gutierrez is a 75 y.o. female with PMH sig for anemia, CAD,CHF, COPD, DM, diverticulosis, GERD, HTN,HLD, OSA, prior stroke, COVID 59 and acute on chronic kidney disease with infiltrated RUE AVF. Request now received for tunneled HD cath placement.   Past Medical History:  Diagnosis Date  . Adenomatous colon polyp   . Allergy   . Anemia   . Asthma       . CAD (coronary artery disease)    Mild very minimal coronary disease with 20% obtuse marginal stenosis  . Carpal tunnel syndrome on left   . Cataract   . CHF (congestive heart failure) ([place])   . Chronic kidney disease (CKD), stage III (moderate) (HCC)    now stage 4  . COPD (chronic obstructive pulmonary disease) ([place])   . Depression   . 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 Diverticulosis   . Dyspnea   . 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 Esophageal dysmotility   . Esophageal stricture   . Gastritis   . Gastroparesis 08/21/2007  . GERD (gastroesophageal reflux disease)   . Hearing loss of both ears   . Hernia, hiatal   . Hyperlipidemia   . Hypertension   . Morbid obesity ([place])   . OSTEOARTHRITIS 08/09/2006  . Osteoporosis   . Oxygen deficiency    prn   . 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 RENAL INSUFFICIENCY 02/16/2009  . 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 Seizures ([place])    pt thinks it has been several monthes since she had a seisure  . Shingles   . Sickle cell trait ([place])   . Sleep apnea    uses cpap  . Stroke [facility])     Past Surgical History:  Procedure Laterality Date  . ABDOMINAL HYSTERECTOMY    . ARTERY BIOPSY  01/07/2011   Procedure: MINOR BIOPSY  TEMPORAL ARTERY;  Surgeon: Haywood Lasso, MD;  Location: [place];  Service: General;  Laterality: Left;  left temporal artery biopsy  . AV FISTULA PLACEMENT Right 11/16/2018   Procedure: CREATION RIGHT BRACHIOCEPHALIC FISTULA  ARTERIOVENOUS FISTULA;  Surgeon: Angelia Mould, MD;  Location: [place];  Service: Vascular;  Laterality: Right;  . bil foot surgery    . BREAST LUMPECTOMY     benign  . BREAST LUMPECTOMY     both breast lumps removed   . CATARACT EXTRACTION Left 06/2016   Dr. Read Drivers  . COLONOSCOPY    . COLONOSCOPY WITH PROPOFOL N/A 07/05/2015   Procedure: COLONOSCOPY WITH PROPOFOL;  Surgeon: Manus Gunning, MD;  Location: [facility];  Service: Gastroenterology;  Laterality: N/A;  . COLONOSCOPY WITH PROPOFOL N/A 08/04/2018   Procedure: COLONOSCOPY WITH PROPOFOL;  Surgeon: Yetta Flock, MD;  Location: [facility];  Service: Gastroenterology;  Laterality: N/A;  . ERD  08/08/2000  . ESOPHAGEAL MANOMETRY N/A 03/13/2015   Procedure: ESOPHAGEAL MANOMETRY (EM);  Surgeon: Manus Gunning, MD;  Location: [facility];  Service: Gastroenterology;  Laterality: N/A;  . ESOPHAGOGASTRODUODENOSCOPY  06/25/2006  . ESOPHAGOGASTRODUODENOSCOPY (EGD) WITH PROPOFOL N/A 07/05/2015   Procedure: ESOPHAGOGASTRODUODENOSCOPY (EGD) WITH PROPOFOL;  Surgeon: Manus Gunning, MD;  Location: [facility];  Service: Gastroenterology;  Laterality: N/A;  . FISTULA SUPERFICIALIZATION Right 11/16/2018   Procedure: Fistula Superficialization;  Surgeon: Angelia Mould, MD;  Location: [place];  Service: Vascular;  Laterality: Right;  . HERNIA REPAIR    . LEFT AND RIGHT HEART CATHETERIZATION WITH CORONARY ANGIOGRAM N/A 03/03/2013   Procedure: LEFT AND RIGHT HEART CATHETERIZATION WITH CORONARY ANGIOGRAM;  Surgeon: Minus Breeding, MD;  Location: [facility] CATH LAB;  Service: Cardiovascular;  Laterality: N/A;  . REPLACEMENT TOTAL KNEE Left 1998  . UPPER GASTROINTESTINAL  ENDOSCOPY      Allergies: Morphine and related and Promethazine hcl  Medications: Prior to Admission medications   Medication Sig Start Date End Date Taking? Authorizing Provider  albuterol (VENTOLIN HFA) 108 (90 Base) MCG/ACT inhaler Inhale 2 puffs into the lungs every 6 (six) hours as needed for wheezing or shortness of breath. 11/15/19  Yes Tanda Rockers, MD  amLODipine (NORVASC) 10 MG tablet Take 10 mg by mouth every evening. 01/09/19  Yes [provider]  aspirin EC 81 MG tablet Take 81 mg by mouth daily.   Yes [provider]  azelastine (ASTELIN) 0.1 % nasal spray Place 1 spray into both nostrils 2 (two) times daily as needed (allergies). Use in each nostril as directed 11/10/19  Yes [name], MD  calcium carbonate (OSCAL) 1500 (600 Ca) MG TABS tablet Take 1 tablet by mouth daily.   Yes [provider]  carvedilol (COREG) 25 MG tablet Take 25 mg by mouth 2 (two) times daily. 01/09/19  Yes [provider]  chlorthalidone (HYGROTON) 25 MG tablet Take 12.5 mg by mouth daily. 01/26/20  Yes [provider]  donepezil (ARICEPT) 10 MG tablet Take 10 mg by mouth at bedtime.   Yes [provider]  ezetimibe (ZETIA) 10 MG tablet Take 10 mg by mouth daily.   Yes [provider]  ferrous sulfate 325 (65 FE) MG tablet Take 325 mg by mouth daily.   Yes [provider]  Fluticasone-Umeclidin-Vilant (TRELEGY ELLIPTA) 100-62.5-25 MCG/INH AEPB Take 1 puff by mouth daily. 04/13/19  Yes Lauraine Rinne, NP  furosemide (LASIX) 80 MG tablet Take 160 mg by mouth 2 (two) times daily.    Yes [provider]  gabapentin (NEURONTIN) 100 MG capsule Take 1 capsule (100 mg total) by mouth 2 (two) times daily. Patient taking differently: Take 100 mg by mouth at bedtime. 09/12/17  Yes Hongalgi, Lenis Dickinson, MD  hydrALAZINE (APRESOLINE) 100 MG tablet Take 1 tablet by mouth 3 (three) times daily. 05/10/19  Yes [provider]  LEVEMIR  FLEXTOUCH 100 UNIT/ML Pen Inject 18 Units into the skin daily. Patient taking differently: Inject 30 Units into the skin daily. 09/13/17  Yes Hongalgi, Lenis Dickinson, MD  losartan (COZAAR) 100 MG tablet Take 100 mg by mouth daily.   Yes [provider]  montelukast (SINGULAIR) 10 MG tablet Take 1 tablet (10 mg total) by mouth daily. Patient taking differently: Take 10 mg by mouth at bedtime as needed (allergy symptoms). 10/22/17  Yes Lauraine Rinne, NP  nitroGLYCERIN (NITROSTAT) 0.4 MG SL tablet PLACE ONE TABLET UNDER THE TONGUE EVERY FIVE MINUTES AS NEEDED FOR CHEST PAIN Patient taking differently: Place 0.4 mg under the tongue every 5 (five) minutes as needed for chest pain. 07/08/16  Yes Hochrein, Jeneen Rinks, MD  NOVOLOG FLEXPEN 100 UNIT/ML FlexPen Inject 5 Units into the skin 3 (three) times daily with meals. Per sliding scale 06/16/18  Yes [provider]  omeprazole (PRILOSEC) 40 MG  capsule Take 1 capsule (40 mg total) by mouth in the morning and at bedtime. Please schedule a yearly follow up: [phone]. Thank you 09/01/19  Yes Armbruster, Carlota Raspberry, MD  OXYGEN Inhale 2 L into the lungs as needed. CPAP with oxygen at bedtime   Yes [provider]  polyethylene glycol (MIRALAX) 17 g packet Take 17 g by mouth daily as needed. Patient taking differently: Take 17 g by mouth daily as needed (constipation.). 06/18/18  Yes Armbruster, Carlota Raspberry, MD  promethazine (PHENERGAN) 12.5 MG tablet Take 12.5 mg by mouth daily as needed for nausea or vomiting. 05/06/19  Yes [provider]  Propylene Glycol (SYSTANE BALANCE) 0.6 % SOLN Place 1 drop into both eyes daily as needed (dry eyes).   Yes [provider]  raloxifene (EVISTA) 60 MG tablet Take 60 mg by mouth every evening.   Yes [provider]  spironolactone (ALDACTONE) 25 MG tablet Take 25 mg by mouth daily. 01/06/19  Yes [provider]  Vitamin D, Ergocalciferol, (DRISDOL) 50000 UNITS CAPS capsule Take 50,000  Units by mouth every Friday.    Yes [provider]  TRUEPLUS PEN NEEDLES 32G X 4 MM MISC INJECT THREE TIMES A DAY AS DIRECTED 07/08/17   [provider]     Family History  Problem Relation Age of Onset  . Liver cancer Mother        Liver Cancer  . Diabetes Mother   . Kidney disease Mother   . Heart disease Mother        age 68's  . Heart disease Father 65       MI  . Heart attack Father        died of MI when pt was 70  . Sickle cell anemia Father   . Colon cancer Brother   . Cancer Brother        Colon Cancer  . Diabetes Sister   . Kidney disease Sister   . Heart disease Sister        age 73's  . Allergies Sister   . Diabetes Sister   . Kidney disease Sister   . Heart disease Sister        age 59's  . Esophageal cancer Neg Hx   . Rectal cancer Neg Hx   . Stomach cancer Neg Hx   . Amblyopia Neg Hx   . Blindness Neg Hx   . Glaucoma Neg Hx   . Macular degeneration Neg Hx   . Retinal detachment Neg Hx   . Cataracts Neg Hx   . Strabismus Neg Hx   . Retinitis pigmentosa Neg Hx     Social History   Socioeconomic History  . Marital status: Widowed    Spouse name: Not on file  . Number of children: 2  . Years of education: Not on file  . Highest education level: Not on file  Occupational History  . Occupation: Retired    Fish farm manager: UNEMPLOYED  Tobacco Use  . Smoking status: Former Smoker    Packs/day: 0.50    Years: 10.00    Pack years: 5.00    Quit date: 04/18/1980    Years since quitting: 39.8  . Smokeless tobacco: Never Used  Vaping Use  . Vaping Use: Never used  Substance and Sexual Activity  . Alcohol use: No    Alcohol/week: 0.0 standard drinks  . Drug use: No  . Sexual activity: Not Currently    Birth control/protection: Post-menopausal  Comment: Hysterectomy  Other Topics Concern  . Not on file  Social History Narrative   Previously worked as a Electrical engineer.   Daily Caffeine Use-Coffee and Tea   Lives with a friend who is  her care giver, has home health nurse come out once a week.  She has family in town- daughter, grand daughter.    Social Determinants of Health   Financial Resource Strain: Not on file  Food Insecurity: Not on file  Transportation Needs: Not on file  Physical Activity: Not on file  Stress: Not on file  Social Connections: Not on file      Review of Systems denies fever,HA,CP, abd /back pain,N/V or bleeding  Vital Signs: BP (!) 118/56 (BP Location: Left Wrist)   Pulse 74   Temp 98.1 F (36.7 C) (Oral)   Resp 20   Ht 5\' 4"  (1.626 m)   Wt 245 lb 2.4 oz (111.2 kg)   SpO2 92%   BMI 42.08 kg/m   Physical Exam awake,answering questions ok; chest- distant BS bilat; heart- RRR; abd- obese, soft,+BS,NT; mild LE edema; infiltrated RUE AVF  Imaging: CT HEAD WO CONTRAST  Result Date: 02/16/2020 CLINICAL DATA:  Altered mental status, somnolence EXAM: CT HEAD WITHOUT CONTRAST TECHNIQUE: Contiguous axial images were obtained from the base of the skull through the vertex without intravenous contrast. COMPARISON:  Eight/19/19 FINDINGS: Brain: Normal anatomic configuration. Parenchymal volume loss is commensurate with the patient's age and stable since prior examination. Stable moderate periventricular white matter changes are present likely reflecting the sequela of small vessel ischemia. Tiny remote infarct within the a left cerebellar hemisphere is again noted. No abnormal intra or extra-axial mass lesion or fluid collection. No abnormal mass effect or midline shift. No evidence of acute intracranial hemorrhage or infarct. Ventricular size is normal. Cerebellum unremarkable. Vascular: No asymmetric hyperdense vasculature at the skull base. Skull: Intact Sinuses/Orbits: Mild mucosal thickening within the right maxillary sinus. Small layering mucus within the left sphenoid sinus. Remaining paranasal sinuses are clear. Orbits are unremarkable. Other: Mastoid air cells and middle ear cavities are clear.  IMPRESSION: No acute intracranial abnormality. Stable senescent changes.  Remote tiny left cerebellar infarct. Minimal paranasal sinus disease. Electronically Signed   By: Fidela Salisbury MD   On: 02/16/2020 05:32   US RENAL  Result Date: 02/16/2020 CLINICAL DATA:  Acute renal injury. EXAM: RENAL / URINARY TRACT ULTRASOUND COMPLETE COMPARISON:  Ultrasound 03/19/2019. FINDINGS: Right Kidney: Renal measurements: 9.4 x 4.5 x 4.3 cm = volume: 93.6 mL. Mild increased echogenicity cannot be excluded. No mass or hydronephrosis visualized. Left Kidney: Renal measurements: 9.1 x 4.7 x 4.2 cm = volume: 94.8 mL. Mild increased echogenicity cannot be excluded. 3.6 cm simple cyst lower pole. No hydronephrosis visualized. Bladder: Appears normal for degree of bladder distention. Other: None. IMPRESSION: 1. Mild increased echogenicity both kidneys suggesting chronic renal disease cannot be excluded. 2. 3.6 cm simple cyst lower pole left kidney. No acute abnormality. No hydronephrosis. No bladder distention. Electronically Signed   By: Marcello Moores  Register   On: 02/16/2020 05:14   DG Chest Portable 1 View  Result Date: 02/15/2020 CLINICAL DATA:  75 year old female with shortness of breath. EXAM: PORTABLE CHEST 1 VIEW COMPARISON:  Chest radiograph dated 11/20/2017 and 10/06/2017. FINDINGS: Bilateral streaky and confluent densities, right greater left, most consistent with developing infiltrate, possibly viral or atypical in etiology. Clinical correlation is recommended. No large pleural effusion pneumothorax. Mild cardiomegaly. No acute osseous pathology. IMPRESSION: Findings most consistent with developing  infiltrate predominantly involving the right lung. Electronically Signed   By: Anner Crete M.D.   On: 02/15/2020 21:02   VAS [place] LOWER EXTREMITY VENOUS (DVT) (ONLY [facility])  Result Date: 02/16/2020  Lower Venous DVT Study Other Indications: D-Dimer. Risk Factors: None identified. Limitations: Body habitus and Pt unable  to tolerate any pressure on legs. Comparison Study: 11/17 Left Negative Performing Technologist: Vonzell Schlatter RVT  Examination Guidelines: A complete evaluation includes B-mode imaging, spectral Doppler, color Doppler, and power Doppler as needed of all accessible portions of each vessel. Bilateral testing is considered an integral part of a complete examination. Limited examinations for reoccurring indications may be performed as noted. The reflux portion of the exam is performed with the patient in reverse Trendelenburg.  +-----+---------------+---------+-----------+----------+-------------------+ RIGHTCompressibilityPhasicitySpontaneityPropertiesThrombus Aging      +-----+---------------+---------+-----------+----------+-------------------+ CFV  Full           Yes      Yes                                      +-----+---------------+---------+-----------+----------+-------------------+ SFJ  Full                                                             +-----+---------------+---------+-----------+----------+-------------------+ POP  Full           Yes      Yes                                      +-----+---------------+---------+-----------+----------+-------------------+ PERO                                              Not well visualized +-----+---------------+---------+-----------+----------+-------------------+ Adequate Color fill in Femoral vein and posterior tibials. Patient unable to tolerate compression  +----+---------------+---------+-----------+----------+-------------------+ LEFTCompressibilityPhasicitySpontaneityPropertiesThrombus Aging      +----+---------------+---------+-----------+----------+-------------------+ CFV Full           Yes      Yes                                      +----+---------------+---------+-----------+----------+-------------------+ SFJ Full                                                              +----+---------------+---------+-----------+----------+-------------------+ POP Full           Yes      Yes                                      +----+---------------+---------+-----------+----------+-------------------+ PTV  Not well visualized +----+---------------+---------+-----------+----------+-------------------+ PERO                                             Not well visualized +----+---------------+---------+-----------+----------+-------------------+ Adequate color fill of Femoral vein due to patients inability to tolerate touch or compressions. Calf veins not seen  Summary: RIGHT: - There is no evidence of deep vein thrombosis in the lower extremity. However, portions of this examination were limited- see technologist comments above.  - No cystic structure found in the popliteal fossa.  LEFT: - There is no evidence of deep vein thrombosis in the lower extremity. However, portions of this examination were limited- see technologist comments above.  - No cystic structure found in the popliteal fossa.  *See table(s) above for measurements and observations. Electronically signed by Harold Barban MD on 02/16/2020 at 9:40:57 PM.    Final     Labs:  CBC: Recent Labs    01/20/20 1243 01/20/20 1256 02/15/20 2043 02/15/20 2139 02/16/20 0643 02/16/20 1815 02/17/20 0219  WBC 4.0  --  4.9  --   --  4.6 4.2  HGB 9.3*   < > 10.4* 11.9* 10.2* 10.3* 8.5*  HCT 32.5*  --  37.3 35.0* 30.0* 33.9* 28.1*  PLT 121*  --  76*  --   --  84* 88*   < > = values in this interval not displayed.    COAGS: Recent Labs    02/15/20 2043 02/15/20 2104  INR 1.2  --   APTT  --  30    BMP: Recent Labs    06/28/19 1224 07/27/19 1227 07/27/19 1229 09/03/19 1401 10/01/19 1217 10/29/19 1110 01/20/20 1243 02/15/20 2043 02/15/20 2139 02/16/20 0643 02/16/20 1815 02/17/20 0219  NA 144 143  --  143 142   < > 142 141 144 142 144 143  K 4.3 4.4   --  4.4 4.2   < > 5.2* 5.0 4.9 5.2* 4.8 4.5  CL 111 110  --  108 109   < > 115* 110  --   --  109 111  CO2 25 23  --  24 23   < > 18* 16*  --   --  21* 18*  GLUCOSE 134* 158*  --  186* 161*   < > 158* 271*  --   --  283* 344*  BUN 27* 47*  --  33* 45*   < > 51* 81*  --   --  83* 92*  CALCIUM 8.6*  8.3* 8.6*   < > 8.7*  8.5* 8.0*  7.9*   < > 8.3*  7.9* 7.5*  --   --  7.6* 7.0*  CREATININE 2.51* 2.89*  --  3.37* 3.64*   < > 3.99* 6.05*  --   --  5.83* 6.24*  GFRNONAA 18* 15*  --  13* 12*   < > 11* 7*  --   --  7* 7*  GFRAA 21* 18*  --  15* 13*  --   --   --   --   --   --   --    < > = values in this interval not displayed.    LIVER FUNCTION TESTS: Recent Labs    01/20/20 1243 02/15/20 2104 02/16/20 1815 02/17/20 0219  BILITOT  --  0.6  --  0.5  AST  --  29  --  26  ALT  --  11  --  13  ALKPHOS  --  58  --  46  PROT  --  7.7  --  6.3*  ALBUMIN 3.4* 2.9* 2.7* 2.3*    TUMOR MARKERS: No results for input(s): AFPTM, CEA, CA199, CHROMGRNA in the last 8760 hours.  Assessment and Plan: 75 y.o. female with PMH sig for anemia, CAD,CHF, COPD, DM, diverticulosis, GERD, HTN,HLD, OSA, prior stroke, COVID 19 and acute on chronic kidney disease with infiltrated RUE AVF. Request now received for tunneled HD cath placement. Risks and benefits discussed with the patient including bleeding, infection, damage to adjacent structures, and sepsis.  All of the patient's questions were answered, patient is agreeable to proceed. Consent signed and in chart.  Procedure scheduled for this afternoon   Thank you for this interesting consult.  I greatly enjoyed meeting Joann Gutierrez and look forward to participating in their care.  A copy of this report was sent to the requesting provider on this date.  Electronically Signed: D. Rowe Robert, PA-C 02/17/2020, 4:11 PM   I spent a total of 20 minutes in face to face in clinical consultation, greater than 50% of which was counseling/coordinating  care for hemodialysis catheter placement

## 2020-02-17 NOTE — Sedation Documentation (Signed)
Patient is resting comfortably. 

## 2020-02-17 NOTE — Sedation Documentation (Signed)
Vital signs stable. 

## 2020-02-17 NOTE — Sedation Documentation (Signed)
Patient is resting. Procedure started 

## 2020-02-17 NOTE — Progress Notes (Signed)
PROGRESS NOTE    Joann Gutierrez  XBM:[id number] DOB: [birth year] DOA: 02/15/2020 PCP: Andree Moro, DO    Brief Narrative:  75 y.o. 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 When EMS arrived she was satting 67-70% on her home CPAP machine which seemed to have a poor seal. On EMS CPAP, sats improved to 93-95%. Blood pressure elevated at 180/100 ED Course: Patient initially required BiPAP but later satting well on 5 L oxygen via nasal cannula. Febrile with temperature 101.8 F. Slightly tachycardic on arrival. Tachypneic. Not hypotensive. WBC 4.9, hemoglobin 10.4 (at baseline), platelet count 76K. Sodium 141, potassium 5.0, chloride 110, bicarb 16, anion gap 15, BUN 81, creatinine 6.0 (was 3.9 on 01/20/2020), glucose 271. High-sensitivity troponin 56 >56. EKG not suggestive of ACS. INR 1.2. Magnesium 1.7. BNP normal. SARS-CoV-2 PCR test positive. Lactic acid 1.5. Blood culture x2 pending. LFTs normal. VBG with pH 7.28. Lactic acid 1.2. Procalcitonin 1.63. Inflammatory markers elevated: D-dimer 2.95, LDH 315, ferritin 919, fibrinogen 592, CRP 17.3.  Chest x-ray showing findings consistent with developing infiltrate predominantly involving the right lung.  Assessment & Plan:   Principal Problem:   Pneumonia due to COVID-19 virus Active Problems:   AKI (acute kidney injury) ([place])   Sepsis ([place])   Acute on chronic respiratory failure with hypoxia ([facility])   Edema of left lower leg    Sepsis and acute on chronic hypoxemic respiratory failure secondary to COVID-19 viral pneumonia: Meets criteria for sepsis-2 SIRS (fever, tachycardia, tachypnea) and  SARS-CoV-2 PCR test positive. No hypotension or lactic acidosis to suggest severe sepsis. Chest x-ray showing findings consistent with developing infiltrate predominantly involving the right lung. Inflammatory markers at presentation were elevated: D-dimer 2.95, LDH 315, ferritin 919, fibrinogen 592, CRP 17.3. Initially required BiPAP but now satting well on 5 L supplemental oxygen. -Continue with Remdesivir and IV Solu-Medrol 0.5 mg/kg every 12 hours -Given elevated procalcitonin of 1.63 at presentation, continued on antibiotics for coverage of bacterial pneumonia as well. -CRP is up to 19.9 -Supplemental oxygen as needed to keep oxygen saturation above 90% -Repeat inflammatory markers in AM  AKI on CKD stage V: Patient has an AV graft in place. BUN 81, creatinine 6.0 (was 3.9 on 01/20/2020). Likely prerenal from dehydration in the setting of acute viral illness in the setting of home diuretic and ARB.  -Lasix, spironolactone, and losartan on hold.  -Nephrology consulted, plan for HD. [place] cath to be placed by IR today -cont to follow renal function  Metabolic acidosis: Bicarb 16, anion gap 15. Likely due to AKI on CKD stage V. VBG with pH within normal range. -Now plan for HD per Nephrology  Left lower extremity edema/concern for DVT: She has unilateral left lower extremity edema and D-dimer elevated in the setting of Covid infection. -Left lower extremity Doppler reviewed, neg for DVT -Heparin now on prophylactic dose only  Acute encephalopathy: Patient is currently somnolent, opens eyes intermittently and falls back asleep.   -Stat head CT noted to be neg , stat ABG reviewed, unremarkable -this AM, pt seems to converse appropriately  Elevated troponin: High-sensitivity troponin slightly elevated but stable (56 > 56). EKG without acute ischemic changes. Troponin elevation likely due to demand ischemia in setting  of Covid pneumonia. -continue Cardiac monitoring  Insulin-dependent  diabetes mellitus -Check A1c. Sliding scale insulin very sensitive ACHS.  Thrombocytopenia: Platelet count currently 76K, was 121K on 01/20/2020 but there are no other prior labs available for comparison. Thrombocytopenia likely related to chronic renal disease and now worse in the setting of acute viral illness. -Plts are trending up. No evidence of bleed -Repeat cbc in AM  COPD: Stable. No wheezing. -Resume bronchodilators as needed   DVT prophylaxis: Heparin subq Code Status: Full Family Communication: Pt in room, family not at bedside  Status is: Inpatient  Remains inpatient appropriate because:Unsafe d/c plan and Inpatient level of care appropriate due to severity of illness   Dispo: The patient is from: Home              Anticipated d/c is to: Home              Anticipated d/c date is: 3 days              Patient currently is not medically stable to d/c.   Difficult to place patient No  Consultants:   Nephrology  IR  Procedures:     Antimicrobials: Anti-infectives (From admission, onward)   Start     Dose/Rate Route Frequency Ordered Stop   02/17/20 0600  ceFAZolin (ANCEF) IVPB 2g/100 mL premix        2 g 200 mL/hr over 30 Minutes Intravenous On call 02/16/20 1635 02/18/20 0600   02/16/20 1000  remdesivir 100 mg in sodium chloride 0.9 % 100 mL IVPB       "Followed by" Linked Group Details   100 mg 200 mL/hr over 30 Minutes Intravenous Daily 02/15/20 2256 02/20/20 0959   02/16/20 0000  remdesivir 200 mg in sodium chloride 0.9% 250 mL IVPB       "Followed by" Linked Group Details   200 mg 580 mL/hr over 30 Minutes Intravenous Once 02/15/20 2256 02/16/20 0239   02/15/20 2115  cefTRIAXone (ROCEPHIN) 2 g in sodium chloride 0.9 % 100 mL IVPB        2 g 200 mL/hr over 30 Minutes Intravenous Every 24 hours 02/15/20 2104     02/15/20 2115  azithromycin (ZITHROMAX) 500 mg in sodium chloride 0.9 % 250 mL IVPB        500 mg 250 mL/hr over 60 Minutes Intravenous  Every 24 hours 02/15/20 2104        Subjective: Without complaints this AM. Without complaints  Objective: Vitals:   02/17/20 0440 02/17/20 0854 02/17/20 1138 02/17/20 1645  BP: 121/62 124/68 (!) 118/56 (!) 142/70  Pulse: 73 76 74 76  Resp: 20 20 20 20   Temp: 98.6 F (37 C) (!) 97.5 F (36.4 C) 98.1 F (36.7 C) 98.1 F (36.7 C)  TempSrc: Oral Oral Oral Oral  SpO2: 92% 93% 92% 94%  Weight:      Height:        Intake/Output Summary (Last 24 hours) at 02/17/2020 1654 Last data filed at 02/17/2020 1304 Gross per 24 hour  Intake 0 ml  Output --  Net 0 ml   Filed Weights   02/16/20 0000 02/16/20 1611  Weight: 116.1 kg 111.2 kg    Examination: General exam: Awake, laying in bed, in nad Respiratory system: Normal respiratory effort, no audible wheezing Cardiovascular system: perfused, no notable jvd Gastrointestinal system: Soft, nondistended, positive BS Central nervous system: CN2-12 grossly intact, strength intact Extremities: Perfused, no clubbing Skin: Normal skin turgor, no notable  skin lesions seen Psychiatry: Mood normal // no visual hallucinations   Data Reviewed: I have personally reviewed following labs and imaging studies  CBC: Recent Labs  Lab 02/15/20 2043 02/15/20 2139 02/16/20 0643 02/16/20 1815 02/17/20 0219  WBC 4.9  --   --  4.6 4.2  NEUTROABS  --   --   --   --  2.6  HGB 10.4* 11.9* 10.2* 10.3* 8.5*  HCT 37.3 35.0* 30.0* 33.9* 28.1*  MCV 73.1*  --   --  69.3* 68.2*  PLT 76*  --   --  84* 88*   Basic Metabolic Panel: Recent Labs  Lab 02/15/20 2043 02/15/20 2139 02/16/20 0643 02/16/20 1815 02/17/20 0219  NA 141 144 142 144 143  K 5.0 4.9 5.2* 4.8 4.5  CL 110  --   --  109 111  CO2 16*  --   --  21* 18*  GLUCOSE 271*  --   --  283* 344*  BUN 81*  --   --  83* 92*  CREATININE 6.05*  --   --  5.83* 6.24*  CALCIUM 7.5*  --   --  7.6* 7.0*  MG 1.7  --   --   --   --   PHOS  --   --   --  5.1*  --    GFR: Estimated Creatinine  Clearance: 9.7 mL/min (A) (by C-G formula based on SCr of 6.24 mg/dL (H)). Liver Function Tests: Recent Labs  Lab 02/15/20 2104 02/16/20 1815 02/17/20 0219  AST 29  --  26  ALT 11  --  13  ALKPHOS 58  --  46  BILITOT 0.6  --  0.5  PROT 7.7  --  6.3*  ALBUMIN 2.9* 2.7* 2.3*   No results for input(s): LIPASE, AMYLASE in the last 168 hours. Recent Labs  Lab 02/16/20 0505  AMMONIA 21   Coagulation Profile: Recent Labs  Lab 02/15/20 2043  INR 1.2   Cardiac Enzymes: No results for input(s): CKTOTAL, CKMB, CKMBINDEX, TROPONINI in the last 168 hours. BNP (last 3 results) No results for input(s): PROBNP in the last 8760 hours. HbA1C: Recent Labs    02/16/20 0646  HGBA1C 7.4*   CBG: Recent Labs  Lab 02/16/20 1612 02/16/20 2056 02/17/20 0603 02/17/20 1134 02/17/20 1643  GLUCAP 246* 247* 314* 163* 144*   Lipid Profile: Recent Labs    02/16/20 0055  TRIG 158*   Thyroid Function Tests: Recent Labs    02/16/20 0505  TSH 0.758   Anemia Panel: Recent Labs    02/16/20 0053 02/16/20 0505 02/17/20 0219  VITAMINB12  --  567  --   FERRITIN 919*  --   --   TIBC  --   --  111*  IRON  --   --  57   Sepsis Labs: Recent Labs  Lab 02/15/20 2104 02/15/20 2235 02/16/20 0053 02/16/20 0505 02/17/20 0219  PROCALCITON  --   --  1.63 1.83 2.17  LATICACIDVEN 1.5 1.2  --   --   --     Recent Results (from the past 240 hour(s))  SARS Coronavirus 2 by RT PCR (hospital order, performed in [place] hospital lab) Nasopharyngeal Nasopharyngeal Swab     Status: Abnormal   Collection Time: 02/15/20  8:44 PM   Specimen: Nasopharyngeal Swab  Result Value Ref Range Status   SARS Coronavirus 2 POSITIVE (A) NEGATIVE Final    Comment: CRITICAL RESULT CALLED TO, READ BACK BY AND  VERIFIED WITH: DR Gar Ponto [id number] FCP (NOTE) SARS-CoV-2 target nucleic acids are DETECTED  SARS-CoV-2 RNA is generally detectable in upper respiratory specimens  during the acute phase of  infection.  Positive results are indicative  of the presence of the identified virus, but do not rule out bacterial infection or co-infection with other pathogens not detected by the test.  Clinical correlation with patient history and  other diagnostic information is necessary to determine patient infection status.  The expected result is negative.  Fact Sheet for Patients:   StrictlyIdeas.no   Fact Sheet for Healthcare Providers:   BankingDealers.co.za    This test is not yet approved or cleared by the [place] FDA and  has been authorized for detection and/or diagnosis of SARS-CoV-2 by FDA under an Emergency Use Authorization (EUA).  This EUA will remain in effect (meaning th is test can be used) for the duration of  the COVID-19 declaration under Section 564(b)(1) of the Act, 21 U.S.C. section 360-bbb-3(b)(1), unless the authorization is terminated or revoked sooner.  Performed at [facility], [place] [address], [place], [place] [address]   Blood Culture (routine x 2)     Status: None (Preliminary result)   Collection Time: 02/15/20  9:04 PM   Specimen: BLOOD  Result Value Ref Range Status   Specimen Description BLOOD SITE NOT SPECIFIED  Final   Special Requests   Final    BOTTLES DRAWN AEROBIC AND ANAEROBIC Blood Culture adequate volume   Culture   Final    NO GROWTH 2 DAYS Performed at [facility] [address], [place], [place] [address]    Report Status PENDING  Incomplete  Urine culture     Status: Abnormal (Preliminary result)   Collection Time: 02/16/20 11:19 AM   Specimen: In/Out Cath Urine  Result Value Ref Range Status   Specimen Description IN/OUT CATH URINE  Final   Special Requests NONE  Final   Culture (A)  Final    20,000 COLONIES/mL ESCHERICHIA COLI SUSCEPTIBILITIES TO FOLLOW Performed at [facility], [place] [address] [place],  [address]    Report Status PENDING  Incomplete      Radiology Studies: CT HEAD WO CONTRAST  Result Date: 02/16/2020 CLINICAL DATA:  Altered mental status, somnolence EXAM: CT HEAD WITHOUT CONTRAST TECHNIQUE: Contiguous axial images were obtained from the base of the skull through the vertex without intravenous contrast. COMPARISON:  Eight/19/19 FINDINGS: Brain: Normal anatomic configuration. Parenchymal volume loss is commensurate with the patient's age and stable since prior examination. Stable moderate periventricular white matter changes are present likely reflecting the sequela of small vessel ischemia. Tiny remote infarct within the a left cerebellar hemisphere is again noted. No abnormal intra or extra-axial mass lesion or fluid collection. No abnormal mass effect or midline shift. No evidence of acute intracranial hemorrhage or infarct. Ventricular size is normal. Cerebellum unremarkable. Vascular: No asymmetric hyperdense vasculature at the skull base. Skull: Intact Sinuses/Orbits: Mild mucosal thickening within the right maxillary sinus. Small layering mucus within the left sphenoid sinus. Remaining paranasal sinuses are clear. Orbits are unremarkable. Other: Mastoid air cells and middle ear cavities are clear. IMPRESSION: No acute intracranial abnormality. Stable senescent changes.  Remote tiny left cerebellar infarct. Minimal paranasal sinus disease. Electronically Signed   By: Fidela Salisbury MD   On: 02/16/2020 05:32   US RENAL  Result Date: 02/16/2020 CLINICAL DATA:  Acute renal injury. EXAM: RENAL / URINARY TRACT ULTRASOUND COMPLETE COMPARISON:  Ultrasound 03/19/2019. FINDINGS: Right Kidney: Renal measurements: 9.4 x 4.5 x 4.3 cm = volume: 93.6 mL. Mild increased echogenicity cannot be excluded. No mass or hydronephrosis visualized. Left Kidney: Renal measurements: 9.1 x 4.7 x 4.2 cm = volume: 94.8 mL. Mild increased echogenicity cannot be excluded. 3.6 cm simple cyst lower pole. No hydronephrosis visualized. Bladder: Appears normal for  degree of bladder distention. Other: None. IMPRESSION: 1. Mild increased echogenicity both kidneys suggesting chronic renal disease cannot be excluded. 2. 3.6 cm simple cyst lower pole left kidney. No acute abnormality. No hydronephrosis. No bladder distention. Electronically Signed   By: Marcello Moores  Register   On: 02/16/2020 05:14   DG Chest Portable 1 View  Result Date: 02/15/2020 CLINICAL DATA:  75 year old female with shortness of breath. EXAM: PORTABLE CHEST 1 VIEW COMPARISON:  Chest radiograph dated 11/20/2017 and 10/06/2017. FINDINGS: Bilateral streaky and confluent densities, right greater left, most consistent with developing infiltrate, possibly viral or atypical in etiology. Clinical correlation is recommended. No large pleural effusion pneumothorax. Mild cardiomegaly. No acute osseous pathology. IMPRESSION: Findings most consistent with developing infiltrate predominantly involving the right lung. Electronically Signed   By: Anner Crete M.D.   On: 02/15/2020 21:02   VAS [place] LOWER EXTREMITY VENOUS (DVT) (ONLY [facility] & [facility])  Result Date: 02/16/2020  Lower Venous DVT Study Other Indications: D-Dimer. Risk Factors: None identified. Limitations: Body habitus and Pt unable to tolerate any pressure on legs. Comparison Study: 11/17 Left Negative Performing Technologist: Vonzell Schlatter RVT  Examination Guidelines: A complete evaluation includes B-mode imaging, spectral Doppler, color Doppler, and power Doppler as needed of all accessible portions of each vessel. Bilateral testing is considered an integral part of a complete examination. Limited examinations for reoccurring indications may be performed as noted. The reflux portion of the exam is performed with the patient in reverse Trendelenburg.  +-----+---------------+---------+-----------+----------+-------------------+ RIGHTCompressibilityPhasicitySpontaneityPropertiesThrombus Aging       +-----+---------------+---------+-----------+----------+-------------------+ CFV  Full           Yes      Yes                                      +-----+---------------+---------+-----------+----------+-------------------+ SFJ  Full                                                             +-----+---------------+---------+-----------+----------+-------------------+ POP  Full           Yes      Yes                                      +-----+---------------+---------+-----------+----------+-------------------+ PERO                                              Not well visualized +-----+---------------+---------+-----------+----------+-------------------+ Adequate Color fill in Femoral vein and posterior tibials. Patient unable to tolerate compression  +----+---------------+---------+-----------+----------+-------------------+ LEFTCompressibilityPhasicitySpontaneityPropertiesThrombus Aging      +----+---------------+---------+-----------+----------+-------------------+ CFV Full           Yes      Yes                                      +----+---------------+---------+-----------+----------+-------------------+  SFJ Full                                                             +----+---------------+---------+-----------+----------+-------------------+ POP Full           Yes      Yes                                      +----+---------------+---------+-----------+----------+-------------------+ PTV                                              Not well visualized +----+---------------+---------+-----------+----------+-------------------+ PERO                                             Not well visualized +----+---------------+---------+-----------+----------+-------------------+ Adequate color fill of Femoral vein due to patients inability to tolerate touch or compressions. Calf veins not seen  Summary: RIGHT: - There is no evidence of deep vein  thrombosis in the lower extremity. However, portions of this examination were limited- see technologist comments above.  - No cystic structure found in the popliteal fossa.  LEFT: - There is no evidence of deep vein thrombosis in the lower extremity. However, portions of this examination were limited- see technologist comments above.  - No cystic structure found in the popliteal fossa.  *See table(s) above for measurements and observations. Electronically signed by Harold Barban MD on 02/16/2020 at 9:40:57 PM.    Final     Scheduled Meds: . amLODipine  10 mg Oral QPM  . vitamin C  500 mg Oral Daily  . aspirin EC  81 mg Oral Daily  . carvedilol  25 mg Oral BID  . Chlorhexidine Gluconate Cloth  6 each Topical Q0600  . cholecalciferol  1,000 Units Oral Daily  . heparin injection (subcutaneous)  7,500 Units Subcutaneous Q8H  . hydrALAZINE  100 mg Oral TID  . insulin aspart  0-5 Units Subcutaneous QHS  . insulin aspart  0-6 Units Subcutaneous TID WC  . insulin aspart  5 Units Subcutaneous TID WC  . insulin detemir  18 Units Subcutaneous Q2200  . methylPREDNISolone (SOLU-MEDROL) injection  0.5 mg/kg Intravenous Q12H   Followed by  . [START ON 02/19/2020] predniSONE  50 mg Oral Daily  . zinc sulfate  220 mg Oral Daily   Continuous Infusions: . azithromycin 500 mg (02/16/20 2323)  .  ceFAZolin (ANCEF) IV    . cefTRIAXone (ROCEPHIN)  IV 2 g (02/16/20 2159)  . remdesivir 100 mg in NS 100 mL 100 mg (02/17/20 0936)     LOS: 2 days   Marylu Lund, MD [facility] Pager On Amion  If 7PM-7AM, please contact night-coverage 02/17/2020, 4:54 PM

## 2020-02-18 DIAGNOSIS — J9601 Acute respiratory failure with hypoxia: Secondary | ICD-10-CM

## 2020-02-18 DIAGNOSIS — J9621 Acute and chronic respiratory failure with hypoxia: Secondary | ICD-10-CM | POA: Diagnosis not present

## 2020-02-18 DIAGNOSIS — U071 COVID-19: Secondary | ICD-10-CM | POA: Diagnosis not present

## 2020-02-18 DIAGNOSIS — J1282 Pneumonia due to coronavirus disease 2019: Secondary | ICD-10-CM | POA: Diagnosis not present

## 2020-02-18 HISTORY — PX: IR US GUIDE VASC ACCESS RIGHT: IMG2390

## 2020-02-18 LAB — GLUCOSE, CAPILLARY
Glucose-Capillary: 252 mg/dL — ABNORMAL HIGH (ref 70–99)
Glucose-Capillary: 101 mg/dL — ABNORMAL HIGH (ref 70–99)
Glucose-Capillary: 158 mg/dL — ABNORMAL HIGH (ref 70–99)
Glucose-Capillary: 213 mg/dL — ABNORMAL HIGH (ref 70–99)

## 2020-02-18 LAB — CBC WITH DIFFERENTIAL/PLATELET
Abs Immature Granulocytes: 0.36 10*3/uL — ABNORMAL HIGH (ref 0.00–0.07)
Basophils Absolute: 0 10*3/uL (ref 0.0–0.1)
Basophils Relative: 0 %
Eosinophils Absolute: 0 10*3/uL (ref 0.0–0.5)
Eosinophils Relative: 0 %
HCT: 28.6 % — ABNORMAL LOW (ref 36.0–46.0)
Hemoglobin: 8.7 g/dL — ABNORMAL LOW (ref 12.0–15.0)
Immature Granulocytes: 6 %
Lymphocytes Relative: 7 %
Lymphs Abs: 0.4 10*3/uL — ABNORMAL LOW (ref 0.7–4.0)
MCH: 20.5 pg — ABNORMAL LOW (ref 26.0–34.0)
MCHC: 30.4 g/dL (ref 30.0–36.0)
MCV: 67.3 fL — ABNORMAL LOW (ref 80.0–100.0)
Monocytes Absolute: 1 10*3/uL (ref 0.1–1.0)
Monocytes Relative: 16 %
Neutro Abs: 4.5 10*3/uL (ref 1.7–7.7)
Neutrophils Relative %: 71 %
Platelets: 127 10*3/uL — ABNORMAL LOW (ref 150–400)
RBC: 4.25 MIL/uL (ref 3.87–5.11)
RDW: 21.1 % — ABNORMAL HIGH (ref 11.5–15.5)
WBC: 6.4 10*3/uL (ref 4.0–10.5)
nRBC: 0.5 % — ABNORMAL HIGH (ref 0.0–0.2)

## 2020-02-18 LAB — COMPREHENSIVE METABOLIC PANEL
ALT: 9 U/L (ref 0–44)
AST: 27 U/L (ref 15–41)
Albumin: 2.3 g/dL — ABNORMAL LOW (ref 3.5–5.0)
Alkaline Phosphatase: 48 U/L (ref 38–126)
Anion gap: 17 — ABNORMAL HIGH (ref 5–15)
BUN: 115 mg/dL — ABNORMAL HIGH (ref 8–23)
CO2: 16 mmol/L — ABNORMAL LOW (ref 22–32)
Calcium: 7.5 mg/dL — ABNORMAL LOW (ref 8.9–10.3)
Chloride: 109 mmol/L (ref 98–111)
Creatinine, Ser: 7.83 mg/dL — ABNORMAL HIGH (ref 0.44–1.00)
GFR, Estimated: 5 mL/min — ABNORMAL LOW (ref >60)
Glucose, Bld: 309 mg/dL — ABNORMAL HIGH (ref 70–99)
Potassium: 4.8 mmol/L (ref 3.5–5.1)
Sodium: 142 mmol/L (ref 135–145)
Total Bilirubin: 0.8 mg/dL (ref 0.3–1.2)
Total Protein: 6.3 g/dL — ABNORMAL LOW (ref 6.5–8.1)

## 2020-02-18 LAB — URINE CULTURE: Culture: 20000 — AB

## 2020-02-18 LAB — C-REACTIVE PROTEIN: CRP: 13.5 mg/dL — ABNORMAL HIGH (ref <1.0)

## 2020-02-18 LAB — BLOOD CULTURE ID PANEL (REFLEXED) - BCID2

## 2020-02-18 LAB — D-DIMER, QUANTITATIVE: D-Dimer, Quant: 2.16 ug/mL-FEU — ABNORMAL HIGH (ref 0.00–0.50)

## 2020-02-18 LAB — PROCALCITONIN: Procalcitonin: 2.31 ng/mL

## 2020-02-18 LAB — PATHOLOGIST SMEAR REVIEW

## 2020-02-18 MED ORDER — LIDOCAINE HCL (PF) 1 % IJ SOLN: 5.0000 mL | INTRAMUSCULAR | Status: DC | PRN

## 2020-02-18 MED ORDER — SODIUM CHLORIDE 0.9 % IV SOLN: 100.0000 mL | INTRAVENOUS | Status: DC | PRN

## 2020-02-18 MED ORDER — ALTEPLASE 2 MG IJ SOLR: 2.0000 mg | Freq: Once | INTRAMUSCULAR | Status: DC | PRN

## 2020-02-18 MED ORDER — PENTAFLUOROPROP-TETRAFLUOROETH EX AERO: 1.0000 "application " | INHALATION_SPRAY | CUTANEOUS | Status: DC | PRN

## 2020-02-18 MED ORDER — HEPARIN SODIUM (PORCINE) 1000 UNIT/ML DIALYSIS
1000.0000 [IU] | INTRAMUSCULAR | Status: DC | PRN
  Filled 2020-02-18: qty 1

## 2020-02-18 MED ORDER — LIDOCAINE-PRILOCAINE 2.5-2.5 % EX CREA
1.0000 "application " | TOPICAL_CREAM | CUTANEOUS | Status: DC | PRN
  Filled 2020-02-18: qty 5

## 2020-02-18 MED ORDER — DARBEPOETIN ALFA 60 MCG/0.3ML IJ SOSY
60.0000 ug | PREFILLED_SYRINGE | Freq: Once | INTRAMUSCULAR | Status: AC
  Administered 2020-02-18: 60 ug via SUBCUTANEOUS
  Filled 2020-02-18: qty 0.3

## 2020-02-18 MED ORDER — DARBEPOETIN ALFA 60 MCG/0.3ML IJ SOSY
60.0000 ug | PREFILLED_SYRINGE | INTRAMUSCULAR | Status: DC
  Filled 2020-02-18: qty 0.3

## 2020-02-18 MED ORDER — HEPARIN SODIUM (PORCINE) 1000 UNIT/ML IJ SOLN
INTRAMUSCULAR | Status: AC
  Filled 2020-02-18: qty 4

## 2020-02-18 NOTE — Progress Notes (Addendum)
Pt returned from IR for HD cath placement. HD cath dressing c/d/i. VS stable. Pt on 6L [place]. Call bell within reach, bed in low position and bed alarm on. Will continue to monitor. Adella Hare, RN

## 2020-02-18 NOTE — Progress Notes (Signed)
PROGRESS NOTE    Joann Gutierrez  IWP:[id number] DOB: 30-Apr-1945 DOA: 02/15/2020 PCP: Andree Moro, DO    Brief Narrative:  75 y.o. 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 When EMS arrived she was satting 67-70% on her home CPAP machine which seemed to have a poor seal. On EMS CPAP, sats improved to 93-95%. Blood pressure elevated at 180/100 ED Course: Patient initially required BiPAP but later satting well on 5 L oxygen via nasal cannula. Febrile with temperature 101.8 F. Slightly tachycardic on arrival. Tachypneic. Not hypotensive. WBC 4.9, hemoglobin 10.4 (at baseline), platelet count 76K. Sodium 141, potassium 5.0, chloride 110, bicarb 16, anion gap 15, BUN 81, creatinine 6.0 (was 3.9 on 01/20/2020), glucose 271. High-sensitivity troponin 56 >56. EKG not suggestive of ACS. INR 1.2. Magnesium 1.7. BNP normal. SARS-CoV-2 PCR test positive. Lactic acid 1.5. Blood culture x2 pending. LFTs normal. VBG with pH 7.28. Lactic acid 1.2. Procalcitonin 1.63. Inflammatory markers elevated: D-dimer 2.95, LDH 315, ferritin 919, fibrinogen 592, CRP 17.3.  Chest x-ray showing findings consistent with developing infiltrate predominantly involving the right lung.  Assessment & Plan:   Principal Problem:   Pneumonia due to COVID-19 virus Active Problems:   AKI (acute kidney injury) ([place])   Sepsis ([place])   Acute on chronic respiratory failure with hypoxia ([facility])   Edema of left lower leg    Sepsis and acute on chronic hypoxemic respiratory failure secondary to COVID-19 viral pneumonia: Meets criteria for sepsis-2 SIRS (fever, tachycardia, tachypnea) and  SARS-CoV-2 PCR test positive. No hypotension or lactic acidosis to suggest severe sepsis. Chest x-ray showing findings consistent with developing infiltrate predominantly involving the right lung. Inflammatory markers at presentation were elevated: D-dimer 2.95, LDH 315, ferritin 919, fibrinogen 592, CRP 17.3. Initially required BiPAP but now satting well on 5 L supplemental oxygen. -Continue with Remdesivir and IV Solu-Medrol 0.5 mg/kg every 12 hours -Given elevated procalcitonin of 1.63 at presentation, continued on antibiotics for coverage of bacterial pneumonia as well. -CRP peaked over 19, down to 13 today -Supplemental oxygen as needed to keep oxygen saturation above 90% -Repeat inflammatory markers in AM  AKI on CKD stage V: Patient has an AV graft in place. BUN 81, creatinine 6.0 (was 3.9 on 01/20/2020). Likely prerenal from dehydration in the setting of acute viral illness in the setting of home diuretic and ARB.  -Lasix, spironolactone, and losartan on hold.  -Nephrology consulted, now s/p [facility] cath. -Seen on HD today, pt tolerated without complaints -cont to follow renal function  Metabolic acidosis: Bicarb 16, anion gap 15. Likely due to AKI on CKD stage V. VBG with pH within normal range. -Cont HD per Nephrology  Left lower extremity edema/concern for DVT: She has unilateral left lower extremity edema and D-dimer elevated in the setting of Covid infection. -Left lower extremity Doppler reviewed, neg for DVT -Continue prophylactic heparin dose   Acute encephalopathy: Patient is currently somnolent, opens eyes intermittently and falls back asleep.   -Stat head CT noted to be neg , stat ABG reviewed, unremarkable -Conversing appropriately  Elevated troponin: High-sensitivity troponin slightly elevated but stable (56 > 56). EKG without acute ischemic changes. Troponin elevation likely due to demand ischemia in setting of Covid pneumonia. -continue  Cardiac  monitoring  Insulin-dependent diabetes mellitus -Check A1c. Sliding scale insulin very sensitive ACHS.  Thrombocytopenia: Platelet count currently 76K, was 121K on 01/20/2020 but there are no other prior labs available for comparison. Thrombocytopenia likely related to chronic renal disease and now worse in the setting of acute viral illness. -Plts are trending up. No evidence of bleed -Repeat cbc in AM  COPD: Stable. No wheezing. -cont with bronchodilators as needed   DVT prophylaxis: Heparin subq Code Status: Full Family Communication: Pt in room, family not at bedside  Status is: Inpatient  Remains inpatient appropriate because:Unsafe d/c plan and Inpatient level of care appropriate due to severity of illness   Dispo: The patient is from: Home              Anticipated d/c is to: Home              Anticipated d/c date is: 3 days              Patient currently is not medically stable to d/c.   Difficult to place patient No  Consultants:   Nephrology  IR  Procedures:     Antimicrobials: Anti-infectives (From admission, onward)   Start     Dose/Rate Route Frequency Ordered Stop   02/17/20 0600  ceFAZolin (ANCEF) IVPB 2g/100 mL premix        2 g 200 mL/hr over 30 Minutes Intravenous On call 02/16/20 1635 02/17/20 1810   02/16/20 1000  remdesivir 100 mg in sodium chloride 0.9 % 100 mL IVPB       "Followed by" Linked Group Details   100 mg 200 mL/hr over 30 Minutes Intravenous Daily 02/15/20 2256 02/20/20 0959   02/16/20 0000  remdesivir 200 mg in sodium chloride 0.9% 250 mL IVPB       "Followed by" Linked Group Details   200 mg 580 mL/hr over 30 Minutes Intravenous Once 02/15/20 2256 02/16/20 0239   02/15/20 2115  cefTRIAXone (ROCEPHIN) 2 g in sodium chloride 0.9 % 100 mL IVPB        2 g 200 mL/hr over 30 Minutes Intravenous Every 24 hours 02/15/20 2104     02/15/20 2115  azithromycin (ZITHROMAX) 500 mg in sodium chloride 0.9 % 250 mL IVPB        500 mg 250  mL/hr over 60 Minutes Intravenous Every 24 hours 02/15/20 2104        Subjective: Seen on HD. Pt reported tolerating without issues  Objective: Vitals:   02/18/20 1010 02/18/20 1020 02/18/20 1116 02/18/20 1655  BP: 140/70 124/69 (!) 146/66 134/64  Pulse: 73 74 73 83  Resp:  (!) 24 20 20   Temp:  98.4 F (36.9 C) 98.1 F (36.7 C) 97.6 F (36.4 C)  TempSrc:  Oral Oral Oral  SpO2:  99% 96% 91%  Weight:  111 kg    Height:        Intake/Output Summary (Last 24 hours) at 02/18/2020 1708 Last data filed at 02/18/2020 1400 Gross per 24 hour  Intake 480 ml  Output 0 ml  Net 480 ml   Filed Weights   02/16/20 1611 02/18/20 0753 02/18/20 1020  Weight: 111.2 kg 111.2 kg 111 kg    Examination: General exam: Conversant, in no acute distress Respiratory system: normal chest rise, clear, no audible wheezing Cardiovascular system: regular rhythm, s1-s2 Gastrointestinal system: Nondistended, nontender, pos BS Central nervous system: No seizures, no tremors Extremities: No cyanosis, no joint deformities Skin: No rashes, no pallor  Psychiatry: Affect normal // no auditory hallucinations   Data Reviewed: I have personally reviewed following labs and imaging studies  CBC: Recent Labs  Lab 02/15/20 2043 02/15/20 2139 02/16/20 0643 02/16/20 1815 02/17/20 0219 02/18/20 0330  WBC 4.9  --   --  4.6 4.2 6.4  NEUTROABS  --   --   --   --  2.6 4.5  HGB 10.4* 11.9* 10.2* 10.3* 8.5* 8.7*  HCT 37.3 35.0* 30.0* 33.9* 28.1* 28.6*  MCV 73.1*  --   --  69.3* 68.2* 67.3*  PLT 76*  --   --  84* 88* 742*   Basic Metabolic Panel: Recent Labs  Lab 02/15/20 2043 02/15/20 2139 02/16/20 0643 02/16/20 1815 02/17/20 0219 02/18/20 0330  NA 141 144 142 144 143 142  K 5.0 4.9 5.2* 4.8 4.5 4.8  CL 110  --   --  109 111 109  CO2 16*  --   --  21* 18* 16*  GLUCOSE 271*  --   --  283* 344* 309*  BUN 81*  --   --  83* 92* 115*  CREATININE 6.05*  --   --  5.83* 6.24* 7.83*  CALCIUM 7.5*  --   --   7.6* 7.0* 7.5*  MG 1.7  --   --   --   --   --   PHOS  --   --   --  5.1*  --   --    GFR: Estimated Creatinine Clearance: 7.7 mL/min (A) (by C-G formula based on SCr of 7.83 mg/dL (H)). Liver Function Tests: Recent Labs  Lab 02/15/20 2104 02/16/20 1815 02/17/20 0219 02/18/20 0330  AST 29  --  26 27  ALT 11  --  13 9  ALKPHOS 58  --  46 48  BILITOT 0.6  --  0.5 0.8  PROT 7.7  --  6.3* 6.3*  ALBUMIN 2.9* 2.7* 2.3* 2.3*   No results for input(s): LIPASE, AMYLASE in the last 168 hours. Recent Labs  Lab 02/16/20 0505  AMMONIA 21   Coagulation Profile: Recent Labs  Lab 02/15/20 2043  INR 1.2   Cardiac Enzymes: No results for input(s): CKTOTAL, CKMB, CKMBINDEX, TROPONINI in the last 168 hours. BNP (last 3 results) No results for input(s): PROBNP in the last 8760 hours. HbA1C: Recent Labs    02/16/20 0646  HGBA1C 7.4*   CBG: Recent Labs  Lab 02/17/20 1643 02/17/20 2100 02/18/20 0613 02/18/20 1137 02/18/20 1653  GLUCAP 144* 158* 252* 101* 158*   Lipid Profile: Recent Labs    02/16/20 0055  TRIG 158*   Thyroid Function Tests: Recent Labs    02/16/20 0505  TSH 0.758   Anemia Panel: Recent Labs    02/16/20 0053 02/16/20 0505 02/17/20 0219  VITAMINB12  --  567  --   FERRITIN 919*  --   --   TIBC  --   --  111*  IRON  --   --  57   Sepsis Labs: Recent Labs  Lab 02/15/20 2104 02/15/20 2235 02/16/20 0053 02/16/20 0505 02/17/20 0219 02/18/20 0330  PROCALCITON  --   --  1.63 1.83 2.17 2.31  LATICACIDVEN 1.5 1.2  --   --   --   --     Recent Results (from the past 240 hour(s))  SARS Coronavirus 2 by RT PCR (hospital order, performed in [facility] hospital lab) Nasopharyngeal Nasopharyngeal Swab     Status: Abnormal   Collection Time: 02/15/20  8:44 PM   Specimen: Nasopharyngeal Swab  Result Value Ref Range Status   SARS Coronavirus 2 POSITIVE (A) NEGATIVE Final    Comment: CRITICAL RESULT CALLED TO, READ BACK BY AND VERIFIED WITH: DR  Aldona Bar P. [id number] FCP (NOTE) SARS-CoV-2 target nucleic acids are DETECTED  SARS-CoV-2 RNA is generally detectable in upper respiratory specimens  during the acute phase of infection.  Positive results are indicative  of the presence of the identified virus, but do not rule out bacterial infection or co-infection with other pathogens not detected by the test.  Clinical correlation with patient history and  other diagnostic information is necessary to determine patient infection status.  The expected result is negative.  Fact Sheet for Patients:   StrictlyIdeas.no   Fact Sheet for Healthcare Providers:   BankingDealers.co.za    This test is not yet approved or cleared by the [place] FDA and  has been authorized for detection and/or diagnosis of SARS-CoV-2 by FDA under an Emergency Use Authorization (EUA).  This EUA will remain in effect (meaning th is test can be used) for the duration of  the COVID-19 declaration under Section 564(b)(1) of the Act, 21 U.S.C. section 360-bbb-3(b)(1), unless the authorization is terminated or revoked sooner.  Performed at [facility], [place] [address] [place], [place] [address]   Blood Culture (routine x 2)     Status: None (Preliminary result)   Collection Time: 02/15/20  9:04 PM   Specimen: BLOOD  Result Value Ref Range Status   Specimen Description BLOOD SITE NOT SPECIFIED  Final   Special Requests   Final    BOTTLES DRAWN AEROBIC AND ANAEROBIC Blood Culture adequate volume   Culture  Setup Time   Final    GRAM POSITIVE COCCI IN CHAINS AEROBIC BOTTLE ONLY Organism ID to follow CRITICAL RESULT CALLED TO, READ BACK BY AND VERIFIED WITH: A. [facility] PharmD 12:15 02/18/20 ([email or web address]) Performed at [facility], [place] [address] [place],  [address]    Culture GRAM POSITIVE COCCI IN CHAINS  Final   Report Status PENDING  Incomplete  Blood Culture ID Panel (Reflexed)      Status: None   Collection Time: 02/15/20  9:04 PM  Result Value Ref Range Status   Enterococcus faecalis NOT DETECTED NOT DETECTED Final   Enterococcus Faecium NOT DETECTED NOT DETECTED Final   Listeria monocytogenes NOT DETECTED NOT DETECTED Final   Staphylococcus species NOT DETECTED NOT DETECTED Final   Staphylococcus aureus (BCID) NOT DETECTED NOT DETECTED Final   Staphylococcus epidermidis NOT DETECTED NOT DETECTED Final   Staphylococcus lugdunensis NOT DETECTED NOT DETECTED Final   Streptococcus species NOT DETECTED NOT DETECTED Final   Streptococcus agalactiae NOT DETECTED NOT DETECTED Final   Streptococcus pneumoniae NOT DETECTED NOT DETECTED Final   Streptococcus pyogenes NOT DETECTED NOT DETECTED Final   A.calcoaceticus-baumannii NOT DETECTED NOT DETECTED Final   Bacteroides fragilis NOT DETECTED NOT DETECTED Final   Enterobacterales NOT DETECTED NOT DETECTED Final   Enterobacter cloacae complex NOT DETECTED NOT DETECTED Final   Escherichia coli NOT DETECTED NOT DETECTED Final   Klebsiella aerogenes NOT DETECTED NOT DETECTED Final   Klebsiella oxytoca NOT DETECTED NOT DETECTED Final   Klebsiella pneumoniae NOT DETECTED NOT DETECTED Final   Proteus species NOT DETECTED NOT DETECTED Final   Salmonella species NOT DETECTED NOT DETECTED Final   Serratia marcescens NOT DETECTED NOT DETECTED Final   Haemophilus influenzae NOT DETECTED NOT DETECTED Final   Neisseria meningitidis NOT  DETECTED NOT DETECTED Final   Pseudomonas aeruginosa NOT DETECTED NOT DETECTED Final   Stenotrophomonas maltophilia NOT DETECTED NOT DETECTED Final   Candida albicans NOT DETECTED NOT DETECTED Final   Candida auris NOT DETECTED NOT DETECTED Final   Candida glabrata NOT DETECTED NOT DETECTED Final   Candida krusei NOT DETECTED NOT DETECTED Final   Candida parapsilosis NOT DETECTED NOT DETECTED Final   Candida tropicalis NOT DETECTED NOT DETECTED Final   Cryptococcus neoformans/gattii NOT DETECTED NOT  DETECTED Final    Comment: Performed at [facility], [place] [address], [place], [place] [address]  Urine culture     Status: Abnormal   Collection Time: 02/16/20 11:19 AM   Specimen: In/Out Cath Urine  Result Value Ref Range Status   Specimen Description IN/OUT CATH URINE  Final   Special Requests   Final    NONE Performed at [facility], [place] [address], [place], [place] [address]    Culture 20,000 COLONIES/mL ESCHERICHIA COLI (A)  Final   Report Status 02/18/2020 FINAL  Final   Organism ID, Bacteria ESCHERICHIA COLI (A)  Final      Susceptibility   Escherichia coli - MIC*    AMPICILLIN >=32 RESISTANT Resistant     CEFAZOLIN <=4 SENSITIVE Sensitive     CEFEPIME <=0.12 SENSITIVE Sensitive     CEFTRIAXONE <=0.25 SENSITIVE Sensitive     CIPROFLOXACIN <=0.25 SENSITIVE Sensitive     GENTAMICIN >=16 RESISTANT Resistant     IMIPENEM 0.5 SENSITIVE Sensitive     NITROFURANTOIN <=16 SENSITIVE Sensitive     TRIMETH/SULFA >=320 RESISTANT Resistant     AMPICILLIN/SULBACTAM 16 INTERMEDIATE Intermediate     PIP/TAZO <=4 SENSITIVE Sensitive     * 20,000 COLONIES/mL ESCHERICHIA COLI     Radiology Studies: IR US Guide Vasc Access Right  Result Date: 02/18/2020 CLINICAL DATA:  Acute kidney injury, COVID-19 infection and need for hemodialysis. An indwelling AV fistula could not be used due to infiltration. EXAM: TUNNELED CENTRAL VENOUS HEMODIALYSIS CATHETER PLACEMENT WITH ULTRASOUND AND FLUOROSCOPIC GUIDANCE ANESTHESIA/SEDATION: 1.0 mg IV Versed; 25 mcg IV Fentanyl. Total Moderate Sedation Time:   15 minutes. The patient's level of consciousness and physiologic status were continuously monitored during the procedure by Radiology nursing. MEDICATIONS: 2 g IV Ancef. FLUOROSCOPY TIME:  Hiking seconds.  2.2 mGy. PROCEDURE: The procedure, risks, benefits, and alternatives were explained to the patient. Questions regarding the procedure were encouraged and answered. The patient understands and  consents to the procedure. A timeout was performed prior to initiating the procedure. The right neck and chest were prepped with chlorhexidine in a sterile fashion, and a sterile drape was applied covering the operative field. Maximum barrier sterile technique with sterile gowns and gloves were used for the procedure. Local anesthesia was provided with 1% lidocaine. Ultrasound was used to confirm patency of the right internal jugular vein. After creating a small venotomy incision, a 21 gauge needle was advanced into the right internal jugular vein under direct, real-time ultrasound guidance. Ultrasound image documentation was performed. After securing guidewire access, an 8 Fr dilator was placed. A J-wire was kinked to measure appropriate catheter length. A palindrome tunneled hemodialysis catheter measuring 19 cm from tip to cuff was chosen for placement. This was tunneled in a retrograde fashion from the chest wall to the venotomy incision. At the venotomy, serial dilatation was performed and a 50 Fr peel-away sheath was placed over a guidewire. The catheter was then placed through the sheath and the sheath  removed. Final catheter positioning was confirmed and documented with a fluoroscopic spot image. The catheter was aspirated, flushed with saline, and injected with appropriate volume heparin dwells. The venotomy incision was closed with subcuticular 4-0 Vicryl. Dermabond was applied to the incision. The catheter exit site was secured with 0-Prolene retention sutures. COMPLICATIONS: None.  No pneumothorax. FINDINGS: After catheter placement, the tip lies in the right atrium. The catheter aspirates normally and is ready for immediate use. IMPRESSION: Placement of tunneled hemodialysis catheter via the right internal jugular vein. The catheter tip lies in the right atrium. The catheter is ready for immediate use. Electronically Signed   By: Aletta Edouard M.D.   On: 02/18/2020 08:27   IR TUNNELED CENTRAL VENOUS  CATHETER PLACEMENT  Result Date: 02/18/2020 CLINICAL DATA:  Acute kidney injury, COVID-19 infection and need for hemodialysis. An indwelling AV fistula could not be used due to infiltration. EXAM: TUNNELED CENTRAL VENOUS HEMODIALYSIS CATHETER PLACEMENT WITH ULTRASOUND AND FLUOROSCOPIC GUIDANCE ANESTHESIA/SEDATION: 1.0 mg IV Versed; 25 mcg IV Fentanyl. Total Moderate Sedation Time:   15 minutes. The patient's level of consciousness and physiologic status were continuously monitored during the procedure by Radiology nursing. MEDICATIONS: 2 g IV Ancef. FLUOROSCOPY TIME:  Hiking seconds.  2.2 mGy. PROCEDURE: The procedure, risks, benefits, and alternatives were explained to the patient. Questions regarding the procedure were encouraged and answered. The patient understands and consents to the procedure. A timeout was performed prior to initiating the procedure. The right neck and chest were prepped with chlorhexidine in a sterile fashion, and a sterile drape was applied covering the operative field. Maximum barrier sterile technique with sterile gowns and gloves were used for the procedure. Local anesthesia was provided with 1% lidocaine. Ultrasound was used to confirm patency of the right internal jugular vein. After creating a small venotomy incision, a 21 gauge needle was advanced into the right internal jugular vein under direct, real-time ultrasound guidance. Ultrasound image documentation was performed. After securing guidewire access, an 8 Fr dilator was placed. A J-wire was kinked to measure appropriate catheter length. A palindrome tunneled hemodialysis catheter measuring 19 cm from tip to cuff was chosen for placement. This was tunneled in a retrograde fashion from the chest wall to the venotomy incision. At the venotomy, serial dilatation was performed and a 50 Fr peel-away sheath was placed over a guidewire. The catheter was then placed through the sheath and the sheath removed. Final catheter positioning  was confirmed and documented with a fluoroscopic spot image. The catheter was aspirated, flushed with saline, and injected with appropriate volume heparin dwells. The venotomy incision was closed with subcuticular 4-0 Vicryl. Dermabond was applied to the incision. The catheter exit site was secured with 0-Prolene retention sutures. COMPLICATIONS: None.  No pneumothorax. FINDINGS: After catheter placement, the tip lies in the right atrium. The catheter aspirates normally and is ready for immediate use. IMPRESSION: Placement of tunneled hemodialysis catheter via the right internal jugular vein. The catheter tip lies in the right atrium. The catheter is ready for immediate use. Electronically Signed   By: Aletta Edouard M.D.   On: 02/18/2020 08:27    Scheduled Meds: . amLODipine  10 mg Oral QPM  . vitamin C  500 mg Oral Daily  . aspirin EC  81 mg Oral Daily  . carvedilol  25 mg Oral BID  . Chlorhexidine Gluconate Cloth  6 each Topical Q0600  . cholecalciferol  1,000 Units Oral Daily  . darbepoetin (ARANESP) injection - DIALYSIS  60 mcg  Intravenous Q Fri-HD  . darbepoetin (ARANESP) injection - NON-DIALYSIS  60 mcg Subcutaneous Once  . heparin injection (subcutaneous)  7,500 Units Subcutaneous Q8H  . hydrALAZINE  100 mg Oral TID  . insulin aspart  0-5 Units Subcutaneous QHS  . insulin aspart  0-6 Units Subcutaneous TID WC  . insulin aspart  5 Units Subcutaneous TID WC  . insulin detemir  18 Units Subcutaneous Q2200  . methylPREDNISolone (SOLU-MEDROL) injection  0.5 mg/kg Intravenous Q12H   Followed by  . [START ON 02/19/2020] predniSONE  50 mg Oral Daily  . zinc sulfate  220 mg Oral Daily   Continuous Infusions: . sodium chloride    . sodium chloride    . azithromycin Stopped (02/18/20 0020)  . cefTRIAXone (ROCEPHIN)  IV Stopped (02/17/20 2310)  . remdesivir 100 mg in NS 100 mL 100 mg (02/18/20 1437)     LOS: 3 days   Marylu Lund, MD [facility] Pager On Amion  If 7PM-7AM,  please contact night-coverage 02/18/2020, 5:08 PM

## 2020-02-18 NOTE — Progress Notes (Addendum)
[facility] Progress Note   Subjective:  S/p TDC and now on HD #1.  No new issues.  Poor appetite.  Breathing improving.   Objective Vitals:   02/18/20 0200 02/18/20 0300 02/18/20 0344 02/18/20 0630  BP: (!) 115/56 (!) 116/57 (!) 111/55 123/61  Pulse: 78 73 76 73  Resp: (!) 21 20 20  (!) 21  Temp:   98 F (36.7 C)   TempSrc:   Oral   SpO2: 96% 97% 91% 100%  Weight:      Height:       Physical Exam General: awake and conversant today Heart: RRR Lungs: clearer ant, no 6L, normal WOB Abdomen: soft Extremities: minor edema Dialysis Access:  RUE AVF + infiltration venous; +t/b present, RIJ TDC c/d/i Qb 200 on 1st HD tx  Additional Objective Labs: Basic Metabolic Panel: Recent Labs  Lab 02/16/20 1815 02/17/20 0219 02/18/20 0330  NA 144 143 142  K 4.8 4.5 4.8  CL 109 111 109  CO2 21* 18* 16*  GLUCOSE 283* 344* 309*  BUN 83* 92* 115*  CREATININE 5.83* 6.24* 7.83*  CALCIUM 7.6* 7.0* 7.5*  PHOS 5.1*  --   --    Liver Function Tests: Recent Labs  Lab 02/15/20 2104 02/16/20 1815 02/17/20 0219 02/18/20 0330  AST 29  --  26 27  ALT 11  --  13 9  ALKPHOS 58  --  46 48  BILITOT 0.6  --  0.5 0.8  PROT 7.7  --  6.3* 6.3*  ALBUMIN 2.9* 2.7* 2.3* 2.3*   No results for input(s): LIPASE, AMYLASE in the last 168 hours. CBC: Recent Labs  Lab 02/15/20 2043 02/15/20 2139 02/16/20 1815 02/17/20 0219 02/18/20 0330  WBC 4.9  --  4.6 4.2 6.4  NEUTROABS  --   --   --  2.6 4.5  HGB 10.4*   < > 10.3* 8.5* 8.7*  HCT 37.3   < > 33.9* 28.1* 28.6*  MCV 73.1*  --  69.3* 68.2* 67.3*  PLT 76*  --  84* 88* 127*   < > = values in this interval not displayed.   Blood Culture    Component Value Date/Time   SDES IN/OUT CATH URINE 02/16/2020 1119   SPECREQUEST  02/16/2020 1119    NONE Performed at [facility], [place] [address] [place], [place] [address]    CULT 20,000 COLONIES/mL ESCHERICHIA COLI (A) 02/16/2020 1119   REPTSTATUS 02/18/2020 FINAL 02/16/2020  1119    Cardiac Enzymes: No results for input(s): CKTOTAL, CKMB, CKMBINDEX, TROPONINI in the last 168 hours. CBG: Recent Labs  Lab 02/17/20 0603 02/17/20 1134 02/17/20 1643 02/17/20 2100 02/18/20 0613  GLUCAP 314* 163* 144* 158* 252*   Iron Studies:  Recent Labs    02/16/20 0053 02/17/20 0219  IRON  --  57  TIBC  --  111*  FERRITIN 919*  --    @lablastinr3 @ Studies/Results: VAS [place] LOWER EXTREMITY VENOUS (DVT) (ONLY [facility] & [facility])  Result Date: 02/16/2020  Lower Venous DVT Study Other Indications: D-Dimer. Risk Factors: None identified. Limitations: Body habitus and Pt unable to tolerate any pressure on legs. Comparison Study: 11/17 Left Negative Performing Technologist: Vonzell Schlatter RVT  Examination Guidelines: A complete evaluation includes B-mode imaging, spectral Doppler, color Doppler, and power Doppler as needed of all accessible portions of each vessel. Bilateral testing is considered an integral part of a complete examination. Limited examinations for reoccurring indications may be performed as noted. The reflux portion of  the exam is performed with the patient in reverse Trendelenburg.  +-----+---------------+---------+-----------+----------+-------------------+ RIGHTCompressibilityPhasicitySpontaneityPropertiesThrombus Aging      +-----+---------------+---------+-----------+----------+-------------------+ CFV  Full           Yes      Yes                                      +-----+---------------+---------+-----------+----------+-------------------+ SFJ  Full                                                             +-----+---------------+---------+-----------+----------+-------------------+ POP  Full           Yes      Yes                                      +-----+---------------+---------+-----------+----------+-------------------+ PERO                                              Not well visualized  +-----+---------------+---------+-----------+----------+-------------------+ Adequate Color fill in Femoral vein and posterior tibials. Patient unable to tolerate compression  +----+---------------+---------+-----------+----------+-------------------+ LEFTCompressibilityPhasicitySpontaneityPropertiesThrombus Aging      +----+---------------+---------+-----------+----------+-------------------+ CFV Full           Yes      Yes                                      +----+---------------+---------+-----------+----------+-------------------+ SFJ Full                                                             +----+---------------+---------+-----------+----------+-------------------+ POP Full           Yes      Yes                                      +----+---------------+---------+-----------+----------+-------------------+ PTV                                              Not well visualized +----+---------------+---------+-----------+----------+-------------------+ PERO                                             Not well visualized +----+---------------+---------+-----------+----------+-------------------+ Adequate color fill of Femoral vein due to patients inability to tolerate touch or compressions. Calf veins not seen  Summary: RIGHT: - There is no evidence of deep vein thrombosis in the lower extremity. However, portions of this examination were limited- see technologist comments above.  - No  cystic structure found in the popliteal fossa.  LEFT: - There is no evidence of deep vein thrombosis in the lower extremity. However, portions of this examination were limited- see technologist comments above.  - No cystic structure found in the popliteal fossa.  *See table(s) above for measurements and observations. Electronically signed by Harold Barban MD on 02/16/2020 at 9:40:57 PM.    Final    Medications: . azithromycin Stopped (02/18/20 0020)  . cefTRIAXone (ROCEPHIN)  IV  Stopped (02/17/20 2310)  . remdesivir 100 mg in NS 100 mL 100 mg (02/17/20 0936)   . amLODipine  10 mg Oral QPM  . vitamin C  500 mg Oral Daily  . aspirin EC  81 mg Oral Daily  . carvedilol  25 mg Oral BID  . Chlorhexidine Gluconate Cloth  6 each Topical Q0600  . cholecalciferol  1,000 Units Oral Daily  . heparin injection (subcutaneous)  7,500 Units Subcutaneous Q8H  . heparin sodium (porcine)      . hydrALAZINE  100 mg Oral TID  . insulin aspart  0-5 Units Subcutaneous QHS  . insulin aspart  0-6 Units Subcutaneous TID WC  . insulin aspart  5 Units Subcutaneous TID WC  . insulin detemir  18 Units Subcutaneous Q2200  . methylPREDNISolone (SOLU-MEDROL) injection  0.5 mg/kg Intravenous Q12H   Followed by  . [START ON 02/19/2020] predniSONE  50 mg Oral Daily  . zinc sulfate  220 mg Oral Daily   Assessment/Plan **Hypoxic respiratory failure:  COVID+, being treated per primary with remdesivir and steroids.  Ok on [place] currently. Does have underlying COPD as well.  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  No UOP with fluid challenge.  Initiating HD - AVF infiltrated 1/26, s/p TDC placement 1/27 with HD #1 1/28.  Tx treatment 1/29 or 30 as schedule allows.   AVF will need to be addressed at a future date when she's out of COVID isolation.  **AMS:  Suspect multifactorial as greatly improved even prior to dialysis but uremia may be playing a role.  **DM: per primary, A1c 7.2  **LLE edema: DVT ruled out  **Anemia:  Hb 10.2 initially then 8.5 > 8.7.  Iron replete.  No obvious bleeding.  Will start ESA with HD QFriday.  Will follow closely  Jannifer Hick MD 02/18/2020, 8:15 AM  [facility] Pager: [phone]

## 2020-02-18 NOTE — Progress Notes (Signed)
PHARMACY - PHYSICIAN COMMUNICATION CRITICAL VALUE ALERT - BLOOD CULTURE IDENTIFICATION (BCID)  Joann Gutierrez is an 75 y.o. female who presented to [facility] on 02/15/2020 with a chief complaint of respiratory distress found to have [place]. She is currently on remdesivir and steroids. Patient's 1/25 blood cultures growing GPC in chains in 1/2 bottles (in aerobic bottle, only 1 set collected). BCID did not ID anything. This could represent contamination - likely to be Strep spp. If is true infection, patient is currently covered with ceftriaxone. Of note, patient did have TDC placed today for acute on chronic kidney failure and need for HD.   Assessment:  Bcx 1/2 GPC in chains (no ID on BCID) - contamination v Strep bacteremia  Name of physician (or Provider) Contacted: Dr. Wyline Copas  Current antibiotics: Ceftriaxone + azithromycin  Changes to prescribed antibiotics recommended:  No changes required  Results for orders placed or performed during the hospital encounter of 02/15/20  Blood Culture ID Panel (Reflexed) (Collected: 02/15/2020  9:04 PM)  Result Value Ref Range   Enterococcus faecalis NOT DETECTED NOT DETECTED   Enterococcus Faecium NOT DETECTED NOT DETECTED   Listeria monocytogenes NOT DETECTED NOT DETECTED   Staphylococcus species NOT DETECTED NOT DETECTED   Staphylococcus aureus (BCID) NOT DETECTED NOT DETECTED   Staphylococcus epidermidis NOT DETECTED NOT DETECTED   Staphylococcus lugdunensis NOT DETECTED NOT DETECTED   Streptococcus species NOT DETECTED NOT DETECTED   Streptococcus agalactiae NOT DETECTED NOT DETECTED   Streptococcus pneumoniae NOT DETECTED NOT DETECTED   Streptococcus pyogenes NOT DETECTED NOT DETECTED   A.calcoaceticus-baumannii NOT DETECTED NOT DETECTED   Bacteroides fragilis NOT DETECTED NOT DETECTED   Enterobacterales NOT DETECTED NOT DETECTED   Enterobacter cloacae complex NOT DETECTED NOT DETECTED   Escherichia coli NOT DETECTED NOT DETECTED    Klebsiella aerogenes NOT DETECTED NOT DETECTED   Klebsiella oxytoca NOT DETECTED NOT DETECTED   Klebsiella pneumoniae NOT DETECTED NOT DETECTED   Proteus species NOT DETECTED NOT DETECTED   Salmonella species NOT DETECTED NOT DETECTED   Serratia marcescens NOT DETECTED NOT DETECTED   Haemophilus influenzae NOT DETECTED NOT DETECTED   Neisseria meningitidis NOT DETECTED NOT DETECTED   Pseudomonas aeruginosa NOT DETECTED NOT DETECTED   Stenotrophomonas maltophilia NOT DETECTED NOT DETECTED   Candida albicans NOT DETECTED NOT DETECTED   Candida auris NOT DETECTED NOT DETECTED   Candida glabrata NOT DETECTED NOT DETECTED   Candida krusei NOT DETECTED NOT DETECTED   Candida parapsilosis NOT DETECTED NOT DETECTED   Candida tropicalis NOT DETECTED NOT DETECTED   Cryptococcus neoformans/gattii NOT DETECTED NOT DETECTED    Esmond Plants 02/18/2020  12:21 PM

## 2020-02-18 NOTE — Progress Notes (Signed)
Daughter called for update on pt status.  Daughter aware no labs drawn after HD today and will await any results tomorrow. Daughter concerned that patient has not voided. I stated that bedside report was that patient was oliguric. Pt told me during morning assessment that she did not pee.  Per nephrologist note 1/26 pt anuric. Daughter concerned that she did not get lasix. I told her that I would pass along her concerns. Text send to MD. PM RN aware. Daughter will follow up 02/19/20 morning.

## 2020-02-19 DIAGNOSIS — J1282 Pneumonia due to coronavirus disease 2019: Secondary | ICD-10-CM | POA: Diagnosis not present

## 2020-02-19 DIAGNOSIS — N179 Acute kidney failure, unspecified: Secondary | ICD-10-CM | POA: Diagnosis not present

## 2020-02-19 DIAGNOSIS — J9621 Acute and chronic respiratory failure with hypoxia: Secondary | ICD-10-CM | POA: Diagnosis not present

## 2020-02-19 DIAGNOSIS — U071 COVID-19: Secondary | ICD-10-CM | POA: Diagnosis not present

## 2020-02-19 LAB — GLUCOSE, CAPILLARY
Glucose-Capillary: 229 mg/dL — ABNORMAL HIGH (ref 70–99)
Glucose-Capillary: 127 mg/dL — ABNORMAL HIGH (ref 70–99)
Glucose-Capillary: 146 mg/dL — ABNORMAL HIGH (ref 70–99)
Glucose-Capillary: 77 mg/dL (ref 70–99)

## 2020-02-19 LAB — CBC WITH DIFFERENTIAL/PLATELET
Abs Immature Granulocytes: 0.48 K/uL — ABNORMAL HIGH (ref 0.00–0.07)
Basophils Absolute: 0 K/uL (ref 0.0–0.1)
Basophils Relative: 0 %
Eosinophils Absolute: 0 K/uL (ref 0.0–0.5)
Eosinophils Relative: 0 %
HCT: 26.2 % — ABNORMAL LOW (ref 36.0–46.0)
Hemoglobin: 8.3 g/dL — ABNORMAL LOW (ref 12.0–15.0)
Immature Granulocytes: 8 %
Lymphocytes Relative: 7 %
Lymphs Abs: 0.4 K/uL — ABNORMAL LOW (ref 0.7–4.0)
MCH: 20.6 pg — ABNORMAL LOW (ref 26.0–34.0)
MCHC: 31.7 g/dL (ref 30.0–36.0)
MCV: 65 fL — ABNORMAL LOW (ref 80.0–100.0)
Monocytes Absolute: 0.9 K/uL (ref 0.1–1.0)
Monocytes Relative: 15 %
Neutro Abs: 4 K/uL (ref 1.7–7.7)
Neutrophils Relative %: 70 %
Platelets: 135 K/uL — ABNORMAL LOW (ref 150–400)
RBC: 4.03 MIL/uL (ref 3.87–5.11)
RDW: 20.3 % — ABNORMAL HIGH (ref 11.5–15.5)
WBC: 5.8 K/uL (ref 4.0–10.5)
nRBC: 0.7 % — ABNORMAL HIGH (ref 0.0–0.2)

## 2020-02-19 LAB — COMPREHENSIVE METABOLIC PANEL WITH GFR
ALT: 10 U/L (ref 0–44)
AST: 32 U/L (ref 15–41)
Albumin: 2.3 g/dL — ABNORMAL LOW (ref 3.5–5.0)
Alkaline Phosphatase: 69 U/L (ref 38–126)
Anion gap: 14 (ref 5–15)
BUN: 77 mg/dL — ABNORMAL HIGH (ref 8–23)
CO2: 20 mmol/L — ABNORMAL LOW (ref 22–32)
Calcium: 7.5 mg/dL — ABNORMAL LOW (ref 8.9–10.3)
Chloride: 104 mmol/L (ref 98–111)
Creatinine, Ser: 6.34 mg/dL — ABNORMAL HIGH (ref 0.44–1.00)
GFR, Estimated: 6 mL/min — ABNORMAL LOW
Glucose, Bld: 221 mg/dL — ABNORMAL HIGH (ref 70–99)
Potassium: 3.8 mmol/L (ref 3.5–5.1)
Sodium: 138 mmol/L (ref 135–145)
Total Bilirubin: 0.4 mg/dL (ref 0.3–1.2)
Total Protein: 6.2 g/dL — ABNORMAL LOW (ref 6.5–8.1)

## 2020-02-19 LAB — PROCALCITONIN: Procalcitonin: 1.9 ng/mL

## 2020-02-19 LAB — D-DIMER, QUANTITATIVE: D-Dimer, Quant: 1.82 ug{FEU}/mL — ABNORMAL HIGH (ref 0.00–0.50)

## 2020-02-19 LAB — C-REACTIVE PROTEIN: CRP: 9.1 mg/dL — ABNORMAL HIGH (ref <1.0)

## 2020-02-19 MED ORDER — FUROSEMIDE 80 MG PO TABS
160.0000 mg | ORAL_TABLET | Freq: Two times a day (BID) | ORAL | Status: DC
  Administered 2020-02-19: 160 mg via ORAL
  Filled 2020-02-19: qty 2

## 2020-02-19 NOTE — Progress Notes (Signed)
Joann Person, RN called and notified the pt's HD tx has been moved to 02/20/2020.

## 2020-02-19 NOTE — Progress Notes (Signed)
PROGRESS NOTE    Joann Gutierrez  MVE:[id number] DOB: [birth year] DOA: 02/15/2020 PCP: Andree Moro, DO    Brief Narrative:  75 y.o. 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 When EMS arrived she was satting 67-70% on her home CPAP machine which seemed to have a poor seal. On EMS CPAP, sats improved to 93-95%. Blood pressure elevated at 180/100 ED Course: Patient initially required BiPAP but later satting well on 5 L oxygen via nasal cannula. Febrile with temperature 101.8 F. Slightly tachycardic on arrival. Tachypneic. Not hypotensive. WBC 4.9, hemoglobin 10.4 (at baseline), platelet count 76K. Sodium 141, potassium 5.0, chloride 110, bicarb 16, anion gap 15, BUN 81, creatinine 6.0 (was 3.9 on 01/20/2020), glucose 271. High-sensitivity troponin 56 >56. EKG not suggestive of ACS. INR 1.2. Magnesium 1.7. BNP normal. SARS-CoV-2 PCR test positive. Lactic acid 1.5. Blood culture x2 pending. LFTs normal. VBG with pH 7.28. Lactic acid 1.2. Procalcitonin 1.63. Inflammatory markers elevated: D-dimer 2.95, LDH 315, ferritin 919, fibrinogen 592, CRP 17.3.  Chest x-ray showing findings consistent with developing infiltrate predominantly involving the right lung.  Assessment & Plan:   Principal Problem:   Pneumonia due to COVID-19 virus Active Problems:   AKI (acute kidney injury) ([place])   Sepsis ([place])   Acute on chronic respiratory failure with hypoxia ([facility])   Edema of left lower leg    Sepsis and acute on chronic hypoxemic respiratory failure secondary to COVID-19 viral pneumonia: Meets criteria for sepsis-2 SIRS (fever, tachycardia, tachypnea) and  SARS-CoV-2 PCR test positive. No hypotension or lactic acidosis to suggest severe sepsis. Chest x-ray showing findings consistent with developing infiltrate predominantly involving the right lung. Inflammatory markers at presentation were elevated: D-dimer 2.95, LDH 315, ferritin 919, fibrinogen 592, CRP 17.3. Initially required BiPAP but now satting well on 5 L supplemental oxygen. -Continued with Remdesivir and IV Solu-Medrol 0.5 mg/kg every 12 hours -Given elevated procalcitonin of 1.63 at presentation, continued on antibiotics for coverage of bacterial pneumonia as well. -CRP peaked over 19, down to 9.1 today -Supplemental oxygen as needed to keep oxygen saturation above 90% -Cont to follow inflammatory markers  AKI on CKD stage V: Patient has an AV graft in place. BUN 81, creatinine 6.0 (was 3.9 on 01/20/2020). Likely prerenal from dehydration in the setting of acute viral illness in the setting of home diuretic and ARB.  -Lasix, spironolactone, and losartan on hold.  -Nephrology consulted, now s/p [facility] cath. -per Nephrology, plan next treatment 2/83 or 6/62  Metabolic acidosis: Bicarb 16, anion gap 15. Likely due to AKI on CKD stage V. VBG with pH within normal range. -Cont HD per Nephrology, per above  Left lower extremity edema/concern for DVT: She has unilateral left lower extremity edema and D-dimer elevated in the setting of Covid infection. -Left lower extremity Doppler reviewed, neg for DVT -Continue prophylactic heparin dose per above  Acute encephalopathy:   -Stat head CT noted to be neg , stat ABG reviewed, unremarkable -Conversing appropriately, much improved  Elevated troponin: High-sensitivity troponin slightly elevated but stable (56 > 56). EKG without acute ischemic changes. Troponin elevation likely due to demand ischemia in setting of Covid pneumonia. -continue Cardiac monitoring  Insulin-dependent diabetes mellitus -Check A1c. Sliding scale insulin  very  sensitive ACHS.  Thrombocytopenia: Platelet count currently 76K, was 121K on 01/20/2020 but there are no other prior labs available for comparison. Thrombocytopenia likely related to chronic renal disease and now worse in the setting of acute viral illness. -Plts are trending back up -Repeat cbc in AM  COPD: Stable. No wheezing. -cont with bronchodilators as needed   DVT prophylaxis: Heparin subq Code Status: Full Family Communication: Pt in room, family not at bedside  Status is: Inpatient  Remains inpatient appropriate because:Unsafe d/c plan and Inpatient level of care appropriate due to severity of illness   Dispo: The patient is from: Home              Anticipated d/c is to: Home              Anticipated d/c date is: 3 days              Patient currently is not medically stable to d/c.   Difficult to place patient No  Consultants:   Nephrology  IR  Procedures:     Antimicrobials: Anti-infectives (From admission, onward)   Start     Dose/Rate Route Frequency Ordered Stop   02/17/20 0600  ceFAZolin (ANCEF) IVPB 2g/100 mL premix        2 g 200 mL/hr over 30 Minutes Intravenous On call 02/16/20 1635 02/17/20 1810   02/16/20 1000  remdesivir 100 mg in sodium chloride 0.9 % 100 mL IVPB       "Followed by" Linked Group Details   100 mg 200 mL/hr over 30 Minutes Intravenous Daily 02/15/20 2256 02/19/20 1045   02/16/20 0000  remdesivir 200 mg in sodium chloride 0.9% 250 mL IVPB       "Followed by" Linked Group Details   200 mg 580 mL/hr over 30 Minutes Intravenous Once 02/15/20 2256 02/16/20 0239   02/15/20 2115  cefTRIAXone (ROCEPHIN) 2 g in sodium chloride 0.9 % 100 mL IVPB        2 g 200 mL/hr over 30 Minutes Intravenous Every 24 hours 02/15/20 2104     02/15/20 2115  azithromycin (ZITHROMAX) 500 mg in sodium chloride 0.9 % 250 mL IVPB        500 mg 250 mL/hr over 60 Minutes Intravenous Every 24 hours 02/15/20 2104        Subjective: Complains of lower abd  fullness  Objective: Vitals:   02/19/20 0306 02/19/20 0832 02/19/20 1236 02/19/20 1500  BP: (!) 141/66 135/87 (!) 152/78 (!) 142/76  Pulse: 74 83 72 78  Resp: 18 18 20  (!) 24  Temp: 98 F (36.7 C) 98.8 F (37.1 C) 98.3 F (36.8 C) 98.2 F (36.8 C)  TempSrc: Oral Oral Oral Oral  SpO2: 96% 91% 95% 97%  Weight:      Height:        Intake/Output Summary (Last 24 hours) at 02/19/2020 1713 Last data filed at 02/19/2020 0300 Gross per 24 hour  Intake 1601.44 ml  Output --  Net 1601.44 ml   Filed Weights   02/16/20 1611 02/18/20 0753 02/18/20 1020  Weight: 111.2 kg 111.2 kg 111 kg    Examination: General exam: Awake, laying in bed, in nad Respiratory system: Normal respiratory effort, no wheezing Cardiovascular system: regular rate, s1, s2 Gastrointestinal system: Soft, nondistended, positive BS Central nervous system: CN2-12 grossly intact, strength intact Extremities: Perfused, no clubbing Skin: Normal skin turgor, no notable skin lesions seen Psychiatry: Mood normal // no visual hallucinations   Data Reviewed:  I have personally reviewed following labs and imaging studies  CBC: Recent Labs  Lab 02/15/20 2043 02/15/20 2139 02/16/20 0643 02/16/20 1815 02/17/20 0219 02/18/20 0330 02/19/20 0104  WBC 4.9  --   --  4.6 4.2 6.4 5.8  NEUTROABS  --   --   --   --  2.6 4.5 4.0  HGB 10.4*   < > 10.2* 10.3* 8.5* 8.7* 8.3*  HCT 37.3   < > 30.0* 33.9* 28.1* 28.6* 26.2*  MCV 73.1*  --   --  69.3* 68.2* 67.3* 65.0*  PLT 76*  --   --  84* 88* 127* 135*   < > = values in this interval not displayed.   Basic Metabolic Panel: Recent Labs  Lab 02/15/20 2043 02/15/20 2139 02/16/20 0643 02/16/20 1815 02/17/20 0219 02/18/20 0330 02/19/20 0104  NA 141   < > 142 144 143 142 138  K 5.0   < > 5.2* 4.8 4.5 4.8 3.8  CL 110  --   --  109 111 109 104  CO2 16*  --   --  21* 18* 16* 20*  GLUCOSE 271*  --   --  283* 344* 309* 221*  BUN 81*  --   --  83* 92* 115* 77*  CREATININE  6.05*  --   --  5.83* 6.24* 7.83* 6.34*  CALCIUM 7.5*  --   --  7.6* 7.0* 7.5* 7.5*  MG 1.7  --   --   --   --   --   --   PHOS  --   --   --  5.1*  --   --   --    < > = values in this interval not displayed.   GFR: Estimated Creatinine Clearance: 9.5 mL/min (A) (by C-G formula based on SCr of 6.34 mg/dL (H)). Liver Function Tests: Recent Labs  Lab 02/15/20 2104 02/16/20 1815 02/17/20 0219 02/18/20 0330 02/19/20 0104  AST 29  --  26 27 32  ALT 11  --  13 9 10   ALKPHOS 58  --  46 48 69  BILITOT 0.6  --  0.5 0.8 0.4  PROT 7.7  --  6.3* 6.3* 6.2*  ALBUMIN 2.9* 2.7* 2.3* 2.3* 2.3*   No results for input(s): LIPASE, AMYLASE in the last 168 hours. Recent Labs  Lab 02/16/20 0505  AMMONIA 21   Coagulation Profile: Recent Labs  Lab 02/15/20 2043  INR 1.2   Cardiac Enzymes: No results for input(s): CKTOTAL, CKMB, CKMBINDEX, TROPONINI in the last 168 hours. BNP (last 3 results) No results for input(s): PROBNP in the last 8760 hours. HbA1C: No results for input(s): HGBA1C in the last 72 hours. CBG: Recent Labs  Lab 02/18/20 1653 02/18/20 2118 02/19/20 0615 02/19/20 1121 02/19/20 1634  GLUCAP 158* 213* 229* 127* 146*   Lipid Profile: No results for input(s): CHOL, HDL, LDLCALC, TRIG, CHOLHDL, LDLDIRECT in the last 72 hours. Thyroid Function Tests: No results for input(s): TSH, T4TOTAL, FREET4, T3FREE, THYROIDAB in the last 72 hours. Anemia Panel: Recent Labs    02/17/20 0219  TIBC 111*  IRON 57   Sepsis Labs: Recent Labs  Lab 02/15/20 2104 02/15/20 2235 02/16/20 0053 02/16/20 0505 02/17/20 0219 02/18/20 0330 02/19/20 0104  PROCALCITON  --   --    < > 1.83 2.17 2.31 1.90  LATICACIDVEN 1.5 1.2  --   --   --   --   --    < > = values  in this interval not displayed.    Recent Results (from the past 240 hour(s))  SARS Coronavirus 2 by RT PCR (hospital order, performed in [facility] hospital lab) Nasopharyngeal Nasopharyngeal Swab     Status: Abnormal    Collection Time: 02/15/20  8:44 PM   Specimen: Nasopharyngeal Swab  Result Value Ref Range Status   SARS Coronavirus 2 POSITIVE (A) NEGATIVE Final    Comment: CRITICAL RESULT CALLED TO, READ BACK BY AND VERIFIED WITH: DR Aldona Bar P. [phone] FCP (NOTE) SARS-CoV-2 target nucleic acids are DETECTED  SARS-CoV-2 RNA is generally detectable in upper respiratory specimens  during the acute phase of infection.  Positive results are indicative  of the presence of the identified virus, but do not rule out bacterial infection or co-infection with other pathogens not detected by the test.  Clinical correlation with patient history and  other diagnostic information is necessary to determine patient infection status.  The expected result is negative.  Fact Sheet for Patients:   StrictlyIdeas.no   Fact Sheet for Healthcare Providers:   BankingDealers.co.za    This test is not yet approved or cleared by the [place] FDA and  has been authorized for detection and/or diagnosis of SARS-CoV-2 by FDA under an Emergency Use Authorization (EUA).  This EUA will remain in effect (meaning th is test can be used) for the duration of  the COVID-19 declaration under Section 564(b)(1) of the Act, 21 U.S.C. section 360-bbb-3(b)(1), unless the authorization is terminated or revoked sooner.  Performed at [facility], [place] [address], [place], [place] [address]   Blood Culture (routine x 2)     Status: None (Preliminary result)   Collection Time: 02/15/20  9:04 PM   Specimen: BLOOD  Result Value Ref Range Status   Specimen Description BLOOD SITE NOT SPECIFIED  Final   Special Requests   Final    BOTTLES DRAWN AEROBIC AND ANAEROBIC Blood Culture adequate volume   Culture  Setup Time   Final    GRAM POSITIVE COCCI IN CHAINS AEROBIC BOTTLE ONLY Organism ID to follow CRITICAL RESULT CALLED TO, READ BACK BY AND VERIFIED WITH: A. [facility] PharmD 12:15  02/18/20 ([email or web address])    Culture   Final    GRAM POSITIVE COCCI IN CHAINS CULTURE REINCUBATED FOR BETTER GROWTH Performed at [facility], [place] [address], [place], [place] [address]    Report Status PENDING  Incomplete  Blood Culture ID Panel (Reflexed)     Status: None   Collection Time: 02/15/20  9:04 PM  Result Value Ref Range Status   Enterococcus faecalis NOT DETECTED NOT DETECTED Final   Enterococcus Faecium NOT DETECTED NOT DETECTED Final   Listeria monocytogenes NOT DETECTED NOT DETECTED Final   Staphylococcus species NOT DETECTED NOT DETECTED Final   Staphylococcus aureus (BCID) NOT DETECTED NOT DETECTED Final   Staphylococcus epidermidis NOT DETECTED NOT DETECTED Final   Staphylococcus lugdunensis NOT DETECTED NOT DETECTED Final   Streptococcus species NOT DETECTED NOT DETECTED Final   Streptococcus agalactiae NOT DETECTED NOT DETECTED Final   Streptococcus pneumoniae NOT DETECTED NOT DETECTED Final   Streptococcus pyogenes NOT DETECTED NOT DETECTED Final   A.calcoaceticus-baumannii NOT DETECTED NOT DETECTED Final   Bacteroides fragilis NOT DETECTED NOT DETECTED Final   Enterobacterales NOT DETECTED NOT DETECTED Final   Enterobacter cloacae complex NOT DETECTED NOT DETECTED Final   Escherichia coli NOT DETECTED NOT DETECTED Final   Klebsiella aerogenes NOT DETECTED NOT DETECTED Final   Klebsiella oxytoca  NOT DETECTED NOT DETECTED Final   Klebsiella pneumoniae NOT DETECTED NOT DETECTED Final   Proteus species NOT DETECTED NOT DETECTED Final   Salmonella species NOT DETECTED NOT DETECTED Final   Serratia marcescens NOT DETECTED NOT DETECTED Final   Haemophilus influenzae NOT DETECTED NOT DETECTED Final   Neisseria meningitidis NOT DETECTED NOT DETECTED Final   Pseudomonas aeruginosa NOT DETECTED NOT DETECTED Final   Stenotrophomonas maltophilia NOT DETECTED NOT DETECTED Final   Candida albicans NOT DETECTED NOT DETECTED Final   Candida auris NOT DETECTED NOT DETECTED  Final   Candida glabrata NOT DETECTED NOT DETECTED Final   Candida krusei NOT DETECTED NOT DETECTED Final   Candida parapsilosis NOT DETECTED NOT DETECTED Final   Candida tropicalis NOT DETECTED NOT DETECTED Final   Cryptococcus neoformans/gattii NOT DETECTED NOT DETECTED Final    Comment: Performed at [facility], [place] [address], [place], [place] [address]  Urine culture     Status: Abnormal   Collection Time: 02/16/20 11:19 AM   Specimen: In/Out Cath Urine  Result Value Ref Range Status   Specimen Description IN/OUT CATH URINE  Final   Special Requests   Final    NONE Performed at [facility], [place] [address] [place], [place] [address]    Culture 20,000 COLONIES/mL ESCHERICHIA COLI (A)  Final   Report Status 02/18/2020 FINAL  Final   Organism ID, Bacteria ESCHERICHIA COLI (A)  Final      Susceptibility   Escherichia coli - MIC*    AMPICILLIN >=32 RESISTANT Resistant     CEFAZOLIN <=4 SENSITIVE Sensitive     CEFEPIME <=0.12 SENSITIVE Sensitive     CEFTRIAXONE <=0.25 SENSITIVE Sensitive     CIPROFLOXACIN <=0.25 SENSITIVE Sensitive     GENTAMICIN >=16 RESISTANT Resistant     IMIPENEM 0.5 SENSITIVE Sensitive     NITROFURANTOIN <=16 SENSITIVE Sensitive     TRIMETH/SULFA >=320 RESISTANT Resistant     AMPICILLIN/SULBACTAM 16 INTERMEDIATE Intermediate     PIP/TAZO <=4 SENSITIVE Sensitive     * 20,000 COLONIES/mL ESCHERICHIA COLI     Radiology Studies: IR US Guide Vasc Access Right  Result Date: 02/18/2020 CLINICAL DATA:  Acute kidney injury, COVID-19 infection and need for hemodialysis. An indwelling AV fistula could not be used due to infiltration. EXAM: TUNNELED CENTRAL VENOUS HEMODIALYSIS CATHETER PLACEMENT WITH ULTRASOUND AND FLUOROSCOPIC GUIDANCE ANESTHESIA/SEDATION: 1.0 mg IV Versed; 25 mcg IV Fentanyl. Total Moderate Sedation Time:   15 minutes. The patient's level of consciousness and physiologic status were continuously monitored during the procedure by  Radiology nursing. MEDICATIONS: 2 g IV Ancef. FLUOROSCOPY TIME:  Hiking seconds.  2.2 mGy. PROCEDURE: The procedure, risks, benefits, and alternatives were explained to the patient. Questions regarding the procedure were encouraged and answered. The patient understands and consents to the procedure. A timeout was performed prior to initiating the procedure. The right neck and chest were prepped with chlorhexidine in a sterile fashion, and a sterile drape was applied covering the operative field. Maximum barrier sterile technique with sterile gowns and gloves were used for the procedure. Local anesthesia was provided with 1% lidocaine. Ultrasound was used to confirm patency of the right internal jugular vein. After creating a small venotomy incision, a 21 gauge needle was advanced into the right internal jugular vein under direct, real-time ultrasound guidance. Ultrasound image documentation was performed. After securing guidewire access, an 8 Fr dilator was placed. A J-wire was kinked to measure appropriate catheter length. A palindrome tunneled hemodialysis catheter  measuring 19 cm from tip to cuff was chosen for placement. This was tunneled in a retrograde fashion from the chest wall to the venotomy incision. At the venotomy, serial dilatation was performed and a 50 Fr peel-away sheath was placed over a guidewire. The catheter was then placed through the sheath and the sheath removed. Final catheter positioning was confirmed and documented with a fluoroscopic spot image. The catheter was aspirated, flushed with saline, and injected with appropriate volume heparin dwells. The venotomy incision was closed with subcuticular 4-0 Vicryl. Dermabond was applied to the incision. The catheter exit site was secured with 0-Prolene retention sutures. COMPLICATIONS: None.  No pneumothorax. FINDINGS: After catheter placement, the tip lies in the right atrium. The catheter aspirates normally and is ready for immediate use.  IMPRESSION: Placement of tunneled hemodialysis catheter via the right internal jugular vein. The catheter tip lies in the right atrium. The catheter is ready for immediate use. Electronically Signed   By: Aletta Edouard M.D.   On: 02/18/2020 08:27   IR TUNNELED CENTRAL VENOUS CATHETER PLACEMENT  Result Date: 02/18/2020 CLINICAL DATA:  Acute kidney injury, COVID-19 infection and need for hemodialysis. An indwelling AV fistula could not be used due to infiltration. EXAM: TUNNELED CENTRAL VENOUS HEMODIALYSIS CATHETER PLACEMENT WITH ULTRASOUND AND FLUOROSCOPIC GUIDANCE ANESTHESIA/SEDATION: 1.0 mg IV Versed; 25 mcg IV Fentanyl. Total Moderate Sedation Time:   15 minutes. The patient's level of consciousness and physiologic status were continuously monitored during the procedure by Radiology nursing. MEDICATIONS: 2 g IV Ancef. FLUOROSCOPY TIME:  Hiking seconds.  2.2 mGy. PROCEDURE: The procedure, risks, benefits, and alternatives were explained to the patient. Questions regarding the procedure were encouraged and answered. The patient understands and consents to the procedure. A timeout was performed prior to initiating the procedure. The right neck and chest were prepped with chlorhexidine in a sterile fashion, and a sterile drape was applied covering the operative field. Maximum barrier sterile technique with sterile gowns and gloves were used for the procedure. Local anesthesia was provided with 1% lidocaine. Ultrasound was used to confirm patency of the right internal jugular vein. After creating a small venotomy incision, a 21 gauge needle was advanced into the right internal jugular vein under direct, real-time ultrasound guidance. Ultrasound image documentation was performed. After securing guidewire access, an 8 Fr dilator was placed. A J-wire was kinked to measure appropriate catheter length. A palindrome tunneled hemodialysis catheter measuring 19 cm from tip to cuff was chosen for placement. This was  tunneled in a retrograde fashion from the chest wall to the venotomy incision. At the venotomy, serial dilatation was performed and a 50 Fr peel-away sheath was placed over a guidewire. The catheter was then placed through the sheath and the sheath removed. Final catheter positioning was confirmed and documented with a fluoroscopic spot image. The catheter was aspirated, flushed with saline, and injected with appropriate volume heparin dwells. The venotomy incision was closed with subcuticular 4-0 Vicryl. Dermabond was applied to the incision. The catheter exit site was secured with 0-Prolene retention sutures. COMPLICATIONS: None.  No pneumothorax. FINDINGS: After catheter placement, the tip lies in the right atrium. The catheter aspirates normally and is ready for immediate use. IMPRESSION: Placement of tunneled hemodialysis catheter via the right internal jugular vein. The catheter tip lies in the right atrium. The catheter is ready for immediate use. Electronically Signed   By: Aletta Edouard M.D.   On: 02/18/2020 08:27    Scheduled Meds: . amLODipine  10 mg Oral  QPM  . vitamin C  500 mg Oral Daily  . aspirin EC  81 mg Oral Daily  . carvedilol  25 mg Oral BID  . Chlorhexidine Gluconate Cloth  6 each Topical Q0600  . cholecalciferol  1,000 Units Oral Daily  . darbepoetin (ARANESP) injection - DIALYSIS  60 mcg Intravenous Q Fri-HD  . furosemide  160 mg Oral BID  . heparin injection (subcutaneous)  7,500 Units Subcutaneous Q8H  . hydrALAZINE  100 mg Oral TID  . insulin aspart  0-5 Units Subcutaneous QHS  . insulin aspart  0-6 Units Subcutaneous TID WC  . insulin aspart  5 Units Subcutaneous TID WC  . insulin detemir  18 Units Subcutaneous Q2200  . predniSONE  50 mg Oral Daily  . zinc sulfate  220 mg Oral Daily   Continuous Infusions: . sodium chloride    . sodium chloride    . azithromycin 500 mg (02/18/20 2306)  . cefTRIAXone (ROCEPHIN)  IV 2 g (02/18/20 2224)     LOS: 4 days    Marylu Lund, MD [facility] Pager On Amion  If 7PM-7AM, please contact night-coverage 02/19/2020, 5:13 PM

## 2020-02-19 NOTE — Progress Notes (Signed)
[facility] Progress Note   Subjective:  C/o HA this AM  Poor appetite.  Breathing improving.   Objective Vitals:   02/18/20 2227 02/19/20 0047 02/19/20 0306 02/19/20 0832  BP: (!) 128/93 (!) 127/59 (!) 141/66 135/87  Pulse:  76 74 83  Resp:  18 18 18   Temp:  98.2 F (36.8 C) 98 F (36.7 C) 98.8 F (37.1 C)  TempSrc:  Oral Oral Oral  SpO2:  92% 96% 91%  Weight:      Height:       Physical Exam General: awake and conversant today Heart: RRR Lungs: clearer ant, on 3L, normal WOB Abdomen: soft Extremities: minor edema Dialysis Access:  RUE AVF + infiltration venous; +t/b present, RIJ TDC c/d/i   Additional Objective Labs: Basic Metabolic Panel: Recent Labs  Lab 02/16/20 1815 02/17/20 0219 02/18/20 0330 02/19/20 0104  NA 144 143 142 138  K 4.8 4.5 4.8 3.8  CL 109 111 109 104  CO2 21* 18* 16* 20*  GLUCOSE 283* 344* 309* 221*  BUN 83* 92* 115* 77*  CREATININE 5.83* 6.24* 7.83* 6.34*  CALCIUM 7.6* 7.0* 7.5* 7.5*  PHOS 5.1*  --   --   --    Liver Function Tests: Recent Labs  Lab 02/17/20 0219 02/18/20 0330 02/19/20 0104  AST 26 27 32  ALT 13 9 10   ALKPHOS 46 48 69  BILITOT 0.5 0.8 0.4  PROT 6.3* 6.3* 6.2*  ALBUMIN 2.3* 2.3* 2.3*   No results for input(s): LIPASE, AMYLASE in the last 168 hours. CBC: Recent Labs  Lab 02/15/20 2043 02/15/20 2139 02/16/20 1815 02/17/20 0219 02/18/20 0330 02/19/20 0104  WBC 4.9  --  4.6 4.2 6.4 5.8  NEUTROABS  --   --   --  2.6 4.5 4.0  HGB 10.4*   < > 10.3* 8.5* 8.7* 8.3*  HCT 37.3   < > 33.9* 28.1* 28.6* 26.2*  MCV 73.1*  --  69.3* 68.2* 67.3* 65.0*  PLT 76*  --  84* 88* 127* 135*   < > = values in this interval not displayed.   Blood Culture    Component Value Date/Time   SDES IN/OUT CATH URINE 02/16/2020 1119   SPECREQUEST  02/16/2020 1119    NONE Performed at [facility], [place] [address], [place], [place] [address]    CULT 20,000 COLONIES/mL ESCHERICHIA COLI (A) 02/16/2020 1119    REPTSTATUS 02/18/2020 FINAL 02/16/2020 1119    Cardiac Enzymes: No results for input(s): CKTOTAL, CKMB, CKMBINDEX, TROPONINI in the last 168 hours. CBG: Recent Labs  Lab 02/18/20 0613 02/18/20 1137 02/18/20 1653 02/18/20 2118 02/19/20 0615  GLUCAP 252* 101* 158* 213* 229*   Iron Studies:  Recent Labs    02/17/20 0219  IRON 57  TIBC 111*   @lablastinr3 @ Studies/Results: IR US Guide Vasc Access Right  Result Date: 02/18/2020 CLINICAL DATA:  Acute kidney injury, COVID-19 infection and need for hemodialysis. An indwelling AV fistula could not be used due to infiltration. EXAM: TUNNELED CENTRAL VENOUS HEMODIALYSIS CATHETER PLACEMENT WITH ULTRASOUND AND FLUOROSCOPIC GUIDANCE ANESTHESIA/SEDATION: 1.0 mg IV Versed; 25 mcg IV Fentanyl. Total Moderate Sedation Time:   15 minutes. The patient's level of consciousness and physiologic status were continuously monitored during the procedure by Radiology nursing. MEDICATIONS: 2 g IV Ancef. FLUOROSCOPY TIME:  Hiking seconds.  2.2 mGy. PROCEDURE: The procedure, risks, benefits, and alternatives were explained to the patient. Questions regarding the procedure were encouraged and answered. The patient understands and consents  to the procedure. A timeout was performed prior to initiating the procedure. The right neck and chest were prepped with chlorhexidine in a sterile fashion, and a sterile drape was applied covering the operative field. Maximum barrier sterile technique with sterile gowns and gloves were used for the procedure. Local anesthesia was provided with 1% lidocaine. Ultrasound was used to confirm patency of the right internal jugular vein. After creating a small venotomy incision, a 21 gauge needle was advanced into the right internal jugular vein under direct, real-time ultrasound guidance. Ultrasound image documentation was performed. After securing guidewire access, an 8 Fr dilator was placed. A J-wire was kinked to measure appropriate  catheter length. A palindrome tunneled hemodialysis catheter measuring 19 cm from tip to cuff was chosen for placement. This was tunneled in a retrograde fashion from the chest wall to the venotomy incision. At the venotomy, serial dilatation was performed and a 50 Fr peel-away sheath was placed over a guidewire. The catheter was then placed through the sheath and the sheath removed. Final catheter positioning was confirmed and documented with a fluoroscopic spot image. The catheter was aspirated, flushed with saline, and injected with appropriate volume heparin dwells. The venotomy incision was closed with subcuticular 4-0 Vicryl. Dermabond was applied to the incision. The catheter exit site was secured with 0-Prolene retention sutures. COMPLICATIONS: None.  No pneumothorax. FINDINGS: After catheter placement, the tip lies in the right atrium. The catheter aspirates normally and is ready for immediate use. IMPRESSION: Placement of tunneled hemodialysis catheter via the right internal jugular vein. The catheter tip lies in the right atrium. The catheter is ready for immediate use. Electronically Signed   By: Aletta Edouard M.D.   On: 02/18/2020 08:27   IR TUNNELED CENTRAL VENOUS CATHETER PLACEMENT  Result Date: 02/18/2020 CLINICAL DATA:  Acute kidney injury, COVID-19 infection and need for hemodialysis. An indwelling AV fistula could not be used due to infiltration. EXAM: TUNNELED CENTRAL VENOUS HEMODIALYSIS CATHETER PLACEMENT WITH ULTRASOUND AND FLUOROSCOPIC GUIDANCE ANESTHESIA/SEDATION: 1.0 mg IV Versed; 25 mcg IV Fentanyl. Total Moderate Sedation Time:   15 minutes. The patient's level of consciousness and physiologic status were continuously monitored during the procedure by Radiology nursing. MEDICATIONS: 2 g IV Ancef. FLUOROSCOPY TIME:  Hiking seconds.  2.2 mGy. PROCEDURE: The procedure, risks, benefits, and alternatives were explained to the patient. Questions regarding the procedure were encouraged and  answered. The patient understands and consents to the procedure. A timeout was performed prior to initiating the procedure. The right neck and chest were prepped with chlorhexidine in a sterile fashion, and a sterile drape was applied covering the operative field. Maximum barrier sterile technique with sterile gowns and gloves were used for the procedure. Local anesthesia was provided with 1% lidocaine. Ultrasound was used to confirm patency of the right internal jugular vein. After creating a small venotomy incision, a 21 gauge needle was advanced into the right internal jugular vein under direct, real-time ultrasound guidance. Ultrasound image documentation was performed. After securing guidewire access, an 8 Fr dilator was placed. A J-wire was kinked to measure appropriate catheter length. A palindrome tunneled hemodialysis catheter measuring 19 cm from tip to cuff was chosen for placement. This was tunneled in a retrograde fashion from the chest wall to the venotomy incision. At the venotomy, serial dilatation was performed and a 50 Fr peel-away sheath was placed over a guidewire. The catheter was then placed through the sheath and the sheath removed. Final catheter positioning was confirmed and documented with a  fluoroscopic spot image. The catheter was aspirated, flushed with saline, and injected with appropriate volume heparin dwells. The venotomy incision was closed with subcuticular 4-0 Vicryl. Dermabond was applied to the incision. The catheter exit site was secured with 0-Prolene retention sutures. COMPLICATIONS: None.  No pneumothorax. FINDINGS: After catheter placement, the tip lies in the right atrium. The catheter aspirates normally and is ready for immediate use. IMPRESSION: Placement of tunneled hemodialysis catheter via the right internal jugular vein. The catheter tip lies in the right atrium. The catheter is ready for immediate use. Electronically Signed   By: Aletta Edouard M.D.   On:  02/18/2020 08:27   Medications: . sodium chloride    . sodium chloride    . azithromycin 500 mg (02/18/20 2306)  . cefTRIAXone (ROCEPHIN)  IV 2 g (02/18/20 2224)  . remdesivir 100 mg in NS 100 mL 100 mg (02/19/20 1015)   . amLODipine  10 mg Oral QPM  . vitamin C  500 mg Oral Daily  . aspirin EC  81 mg Oral Daily  . carvedilol  25 mg Oral BID  . Chlorhexidine Gluconate Cloth  6 each Topical Q0600  . cholecalciferol  1,000 Units Oral Daily  . darbepoetin (ARANESP) injection - DIALYSIS  60 mcg Intravenous Q Fri-HD  . heparin injection (subcutaneous)  7,500 Units Subcutaneous Q8H  . hydrALAZINE  100 mg Oral TID  . insulin aspart  0-5 Units Subcutaneous QHS  . insulin aspart  0-6 Units Subcutaneous TID WC  . insulin aspart  5 Units Subcutaneous TID WC  . insulin detemir  18 Units Subcutaneous Q2200  . predniSONE  50 mg Oral Daily  . zinc sulfate  220 mg Oral Daily   Assessment/Plan **Hypoxic respiratory failure:  COVID+, being treated per primary with remdesivir and steroids.  Ok on [place] currently weaned from 6 to 3. Does have underlying COPD as well.  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  No UOP with fluid challenge.  Initiated HD - AVF infiltrated 1/26, s/p TDC placement 1/27 with HD #1 1/28.  Tx treatment 1/29 or 30 as schedule allows.   AVF will need to be addressed at a future date when she's out of COVID isolation.  **AMS:  Suspect multifactorial as greatly improved even prior to dialysis but uremia may be playing a role.  **DM: per primary, A1c 7.2  **LLE edema: DVT ruled out  **Anemia:  Hb 10.2 initially then 8.5 > 8.7 > 8.3.  Iron replete.  No obvious bleeding.  Started ESA with HD QFriday.  Will follow closely  Jannifer Hick MD 02/19/2020, 10:27 AM  [facility] Pager: [phone]

## 2020-02-19 NOTE — Evaluation (Signed)
Physical Therapy Evaluation Patient Details Name: Joann Gutierrez MRN: [id number] DOB: 1945/01/22 Today's Date: 02/19/2020   History of Present Illness  75 y.o. female with a hx of chronic respiratory failure on 2L via [place] @ baseline, CHF last EF 65-70%, pulmonary hypertension, CKD, DM, COPD, anemia, hyperlipidemia, sleep apnea on CPAP @ night, & anemia who presents to the emergency department via EMS for progressively worsening shortness of breath x 6 days. Patient found to have respiratory failure 2/2 COVID PNA.  Clinical Impression  Unsure of PLOF as patient has hx of dementia and oriented to self and place this session. Patient is fearful of falling and requires encouragement to perform mobility. Patient on 3L O2 Alex with spO2 maintaining >90% throughout session. Patient requires minA for supine>sit with use of bed rails. Patient performed sit to stand with RW and modA+2, however patient only stood 30 seconds before requesting to sit due to fear of falling. Patient presents with impaired cognition, decreased activity tolerance, impaired balance, generalized weakness. Patient will benefit from skilled PT services during acute stay to address listed deficits. Recommend SNF following discharge to maximize functional mobility.     Follow Up Recommendations SNF;Supervision/Assistance - 24 hour    Equipment Recommendations  Rolling Farran Amsden with 5" wheels;3in1 (PT)    Recommendations for Other Services OT consult     Precautions / Restrictions Precautions Precautions: Fall Precaution Comments: watch O2 Restrictions Weight Bearing Restrictions: No      Mobility  Bed Mobility Overal bed mobility: Needs Assistance Bed Mobility: Supine to Sit;Sit to Supine     Supine to sit: Min assist Sit to supine: Min guard   General bed mobility comments: minA to reposition hips to EOB    Transfers Overall transfer level: Needs assistance Equipment used: Rolling Rayli Wiederhold (2 wheeled) Transfers:  Sit to/from Stand Sit to Stand: Mod assist;+2 safety/equipment;+2 physical assistance         General transfer comment: modA+2 for power up, patient fearful of falling and requesting to sit after 30 seconds of standing. Requires extra time due to fear  Ambulation/Gait                Stairs            Wheelchair Mobility    Modified Rankin (Stroke Patients Only)       Balance Overall balance assessment: Needs assistance Sitting-balance support: No upper extremity supported;Feet supported Sitting balance-[name]: Fair     Standing balance support: Bilateral upper extremity supported;During functional activity Standing balance-[name]: Poor Standing balance comment: reliant on UE support and external assist                             Pertinent Vitals/Pain Pain Assessment: Faces Faces Pain Scale: Hurts little more Pain Location: abdomen Pain Descriptors / Indicators: Discomfort;Grimacing Pain Intervention(s): Monitored during session;Repositioned    Home Living Family/patient expects to be discharged to:: Private residence Living Arrangements: Alone   Type of Home: House Home Access: Stairs to enter Entrance Stairs-Rails: Psychiatric nurse of Steps: 4 Home Layout: One level Home Equipment: Environmental consultant - 2 wheels;Cane - single point Additional Comments: Unreliable history/home setup due to hx of dementia    Prior Function Level of Independence: Needs assistance   Gait / Transfers Assistance Needed: patient states she was walking with cane most of the time  ADL's / Homemaking Assistance Needed: states she was independent, however needs heavy assistance for tasks per nursing  staff        Hand Dominance        Extremity/Trunk Assessment   Upper Extremity Assessment Upper Extremity Assessment: Generalized weakness    Lower Extremity Assessment Lower Extremity Assessment: Generalized weakness        Communication   Communication: HOH  Cognition Arousal/Alertness: Awake/alert Behavior During Therapy: Anxious Overall Cognitive Status: History of cognitive impairments - at baseline                                 General Comments: Oriented to self and place, states "I didn't feel good" when asked why are you here      General Comments General comments (skin integrity, edema, etc.): Patient on 3L O2 on arrival with spO2 maintaining >90%    Exercises     Assessment/Plan    PT Assessment Patient needs continued PT services  PT Problem List Decreased strength;Decreased activity tolerance;Decreased balance;Decreased mobility;Decreased cognition;Cardiopulmonary status limiting activity       PT Treatment Interventions Gait training;DME instruction;Functional mobility training;Therapeutic activities;Therapeutic exercise;Balance training;Stair training;Patient/family education    PT Goals (Current goals can be found in the Care Plan section)  Acute Rehab PT Goals Patient Stated Goal: to be safe PT Goal Formulation: Patient unable to participate in goal setting Time For Goal Achievement: 03/04/20 Potential to Achieve Goals: Fair    Frequency Min 3X/week   Barriers to discharge        Co-evaluation               AM-PAC PT "6 Clicks" Mobility  Outcome Measure Help needed turning from your back to your side while in a flat bed without using bedrails?: A Little Help needed moving from lying on your back to sitting on the side of a flat bed without using bedrails?: A Little Help needed moving to and from a bed to a chair (including a wheelchair)?: A Lot Help needed standing up from a chair using your arms (e.g., wheelchair or bedside chair)?: A Lot Help needed to walk in hospital room?: A Lot Help needed climbing 3-5 steps with a railing? : Total 6 Click Score: 13    End of Session Equipment Utilized During Treatment: Gait belt;Oxygen Activity Tolerance:  Other (comment) (Limited by fear of falling) Patient left: in bed;with call bell/phone within reach Nurse Communication: Mobility status PT Visit Diagnosis: Unsteadiness on feet (R26.81);Muscle weakness (generalized) (M62.81);Other abnormalities of gait and mobility (R26.89)    Time: 9373-4287 PT Time Calculation (min) (ACUTE ONLY): 34 min   Charges:   PT Evaluation $PT Eval Moderate Complexity: 1 Mod PT Treatments $Therapeutic Activity: 8-22 mins        Avya Flavell A. Gilford Rile PT, DPT Acute Rehabilitation Services Pager [phone] Office [phone]   Alda Lea 02/19/2020, 5:27 PM

## 2020-02-20 DIAGNOSIS — N179 Acute kidney failure, unspecified: Secondary | ICD-10-CM | POA: Diagnosis not present

## 2020-02-20 DIAGNOSIS — U071 COVID-19: Secondary | ICD-10-CM | POA: Diagnosis not present

## 2020-02-20 DIAGNOSIS — J9621 Acute and chronic respiratory failure with hypoxia: Secondary | ICD-10-CM | POA: Diagnosis not present

## 2020-02-20 DIAGNOSIS — J9601 Acute respiratory failure with hypoxia: Secondary | ICD-10-CM | POA: Diagnosis not present

## 2020-02-20 LAB — COMPREHENSIVE METABOLIC PANEL
ALT: 8 U/L (ref 0–44)
AST: 20 U/L (ref 15–41)
Albumin: 2.4 g/dL — ABNORMAL LOW (ref 3.5–5.0)
Alkaline Phosphatase: 59 U/L (ref 38–126)
Anion gap: 15 (ref 5–15)
BUN: 91 mg/dL — ABNORMAL HIGH (ref 8–23)
CO2: 20 mmol/L — ABNORMAL LOW (ref 22–32)
Calcium: 7.7 mg/dL — ABNORMAL LOW (ref 8.9–10.3)
Chloride: 104 mmol/L (ref 98–111)
Creatinine, Ser: 8.2 mg/dL — ABNORMAL HIGH (ref 0.44–1.00)
GFR, Estimated: 5 mL/min — ABNORMAL LOW (ref >60)
Glucose, Bld: 134 mg/dL — ABNORMAL HIGH (ref 70–99)
Potassium: 4 mmol/L (ref 3.5–5.1)
Sodium: 139 mmol/L (ref 135–145)
Total Bilirubin: 0.6 mg/dL (ref 0.3–1.2)
Total Protein: 6.2 g/dL — ABNORMAL LOW (ref 6.5–8.1)

## 2020-02-20 LAB — CBC WITH DIFFERENTIAL/PLATELET
Abs Immature Granulocytes: 0 10*3/uL (ref 0.00–0.07)
Basophils Absolute: 0 10*3/uL (ref 0.0–0.1)
Basophils Relative: 0 %
Eosinophils Absolute: 0 10*3/uL (ref 0.0–0.5)
Eosinophils Relative: 0 %
HCT: 26.1 % — ABNORMAL LOW (ref 36.0–46.0)
Hemoglobin: 8.4 g/dL — ABNORMAL LOW (ref 12.0–15.0)
Lymphocytes Relative: 6 %
Lymphs Abs: 0.4 10*3/uL — ABNORMAL LOW (ref 0.7–4.0)
MCH: 20.7 pg — ABNORMAL LOW (ref 26.0–34.0)
MCHC: 32.2 g/dL (ref 30.0–36.0)
MCV: 64.3 fL — ABNORMAL LOW (ref 80.0–100.0)
Monocytes Absolute: 1.1 10*3/uL — ABNORMAL HIGH (ref 0.1–1.0)
Monocytes Relative: 16 %
Neutro Abs: 5.3 10*3/uL (ref 1.7–7.7)
Neutrophils Relative %: 78 %
Platelets: 167 10*3/uL (ref 150–400)
RBC: 4.06 MIL/uL (ref 3.87–5.11)
RDW: 20.5 % — ABNORMAL HIGH (ref 11.5–15.5)
WBC: 6.8 10*3/uL (ref 4.0–10.5)
nRBC: 0.6 % — ABNORMAL HIGH (ref 0.0–0.2)
nRBC: 1 /100 WBC — ABNORMAL HIGH

## 2020-02-20 LAB — C-REACTIVE PROTEIN: CRP: 6 mg/dL — ABNORMAL HIGH (ref <1.0)

## 2020-02-20 LAB — GLUCOSE, CAPILLARY
Glucose-Capillary: 154 mg/dL — ABNORMAL HIGH (ref 70–99)
Glucose-Capillary: 188 mg/dL — ABNORMAL HIGH (ref 70–99)
Glucose-Capillary: 251 mg/dL — ABNORMAL HIGH (ref 70–99)
Glucose-Capillary: 229 mg/dL — ABNORMAL HIGH (ref 70–99)

## 2020-02-20 LAB — D-DIMER, QUANTITATIVE: D-Dimer, Quant: 1.47 ug/mL-FEU — ABNORMAL HIGH (ref 0.00–0.50)

## 2020-02-20 LAB — PROCALCITONIN: Procalcitonin: 1.43 ng/mL

## 2020-02-20 MED ORDER — DARBEPOETIN ALFA 60 MCG/0.3ML IJ SOSY
60.0000 ug | PREFILLED_SYRINGE | INTRAMUSCULAR | Status: DC
  Filled 2020-02-20: qty 0.3

## 2020-02-20 MED ORDER — ONDANSETRON HCL 4 MG/2ML IJ SOLN
4.0000 mg | Freq: Four times a day (QID) | INTRAMUSCULAR | Status: DC | PRN
  Administered 2020-02-20: 4 mg via INTRAVENOUS
  Filled 2020-02-20: qty 2

## 2020-02-20 MED ORDER — DARBEPOETIN ALFA 60 MCG/0.3ML IJ SOSY: 60.0000 ug | PREFILLED_SYRINGE | INTRAMUSCULAR | Status: DC

## 2020-02-20 NOTE — Progress Notes (Signed)
[facility] Progress Note   Subjective:  No UOP doc yesterday, bladder scan 4mL but reported episodes of incontinence.  HD rescheduled for today.  BUN 91, K 4, Cr 8.2.  Joann Gutierrez has no new complaints.  Objective Vitals:   02/19/20 2348 02/20/20 0559 02/20/20 0749 02/20/20 1117  BP: 130/65 (!) 148/64 (!) 129/57 (!) 142/70  Pulse: 78 87 70 78  Resp: (!) 21 19 18 17   Temp: 98.5 F (36.9 C) 97.9 F (36.6 C) 98.5 F (36.9 C) 98.6 F (37 C)  TempSrc: Oral Axillary Oral Oral  SpO2: 92% 91% 92% 91%  Weight:      Height:       Physical Exam General: sleeping, arouses easily but seems a bit confused Heart: RRR Lungs: clearer ant, on 3L, normal WOB Abdomen: soft Extremities: minor edema Dialysis Access:  RUE AVF + infiltration venous; +t/b present, RIJ TDC c/d/i   Additional Objective Labs: Basic Metabolic Panel: Recent Labs  Lab 02/16/20 1815 02/17/20 0219 02/18/20 0330 02/19/20 0104 02/20/20 0315  NA 144   < > 142 138 139  K 4.8   < > 4.8 3.8 4.0  CL 109   < > 109 104 104  CO2 21*   < > 16* 20* 20*  GLUCOSE 283*   < > 309* 221* 134*  BUN 83*   < > 115* 77* 91*  CREATININE 5.83*   < > 7.83* 6.34* 8.20*  CALCIUM 7.6*   < > 7.5* 7.5* 7.7*  PHOS 5.1*  --   --   --   --    < > = values in this interval not displayed.   Liver Function Tests: Recent Labs  Lab 02/18/20 0330 02/19/20 0104 02/20/20 0315  AST 27 32 20  ALT 9 10 8   ALKPHOS 48 69 59  BILITOT 0.8 0.4 0.6  PROT 6.3* 6.2* 6.2*  ALBUMIN 2.3* 2.3* 2.4*   No results for input(s): LIPASE, AMYLASE in the last 168 hours. CBC: Recent Labs  Lab 02/16/20 1815 02/16/20 1815 02/17/20 0219 02/18/20 0330 02/19/20 0104 02/20/20 0315  WBC 4.6  --  4.2 6.4 5.8 6.8  NEUTROABS  --    < > 2.6 4.5 4.0 5.3  HGB 10.3*  --  8.5* 8.7* 8.3* 8.4*  HCT 33.9*  --  28.1* 28.6* 26.2* 26.1*  MCV 69.3*  --  68.2* 67.3* 65.0* 64.3*  PLT 84*  --  88* 127* 135* 167   < > = values in this interval not displayed.    Blood Culture    Component Value Date/Time   SDES IN/OUT CATH URINE 02/16/2020 1119   SPECREQUEST  02/16/2020 1119    NONE Performed at [facility], [place] [address], [place], [place] [address]    CULT 20,000 COLONIES/mL ESCHERICHIA COLI (A) 02/16/2020 1119   REPTSTATUS 02/18/2020 FINAL 02/16/2020 1119    Cardiac Enzymes: No results for input(s): CKTOTAL, CKMB, CKMBINDEX, TROPONINI in the last 168 hours. CBG: Recent Labs  Lab 02/19/20 1121 02/19/20 1634 02/19/20 2057 02/20/20 0558 02/20/20 1127  GLUCAP 127* 146* 77 154* 188*   Iron Studies:  No results for input(s): IRON, TIBC, TRANSFERRIN, FERRITIN in the last 72 hours. @lablastinr3 @ Studies/Results: No results found. Medications: . sodium chloride    . sodium chloride    . azithromycin Stopped (02/20/20 0102)  . cefTRIAXone (ROCEPHIN)  IV Stopped (02/19/20 2247)   . amLODipine  10 mg Oral QPM  . vitamin C  500 mg Oral  Daily  . aspirin EC  81 mg Oral Daily  . carvedilol  25 mg Oral BID  . Chlorhexidine Gluconate Cloth  6 each Topical Q0600  . cholecalciferol  1,000 Units Oral Daily  . [START ON 02/27/2020] darbepoetin (ARANESP) injection - DIALYSIS  60 mcg Intravenous Q Sun-HD  . furosemide  160 mg Oral BID  . heparin injection (subcutaneous)  7,500 Units Subcutaneous Q8H  . hydrALAZINE  100 mg Oral TID  . insulin aspart  0-5 Units Subcutaneous QHS  . insulin aspart  0-6 Units Subcutaneous TID WC  . insulin aspart  5 Units Subcutaneous TID WC  . insulin detemir  18 Units Subcutaneous Q2200  . predniSONE  50 mg Oral Daily  . zinc sulfate  220 mg Oral Daily   Assessment/Plan **Hypoxic respiratory failure:  COVID+, being treated per primary with remdesivir and steroids.  Ok on [place] currently weaned from 6 to 3. Does have underlying COPD as well.  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  Failed fluid  challenge.  Initiated HD - AVF infiltrated 1/26, s/p TDC placement 1/27 with HD #1 1/28.  Tx treatment 1/29 or 30 as schedule allows so should be today.   AVF will need to be addressed at a future date when Joann Gutierrez's out of COVID isolation.  CLIP to outpt unit.  Doing a trial of diuretics per daughter's request but in light of no UOP today would D/C tomorrow if still none.  **AMS:  Suspect multifactorial as greatly improved even prior to dialysis but uremia may be playing a role.  **DM: per primary, A1c 7.2  **LLE edema: DVT ruled out  **Anemia:  Stable in the 8s.  Iron replete.  No obvious bleeding.  Started ESA with HD QFriday.  Will follow closely  Jannifer Hick MD 02/20/2020, 12:30 PM  [facility] Pager: [phone]

## 2020-02-20 NOTE — Progress Notes (Signed)
PROGRESS NOTE    Joann Gutierrez  SAY:[id number] DOB: 07-Jun-1945 DOA: 02/15/2020 PCP: Andree Moro, DO    Brief Narrative:  75 y.o. 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 When EMS arrived she was satting 67-70% on her home CPAP machine which seemed to have a poor seal. On EMS CPAP, sats improved to 93-95%. Blood pressure elevated at 180/100 ED Course: Patient initially required BiPAP but later satting well on 5 L oxygen via nasal cannula. Febrile with temperature 101.8 F. Slightly tachycardic on arrival. Tachypneic. Not hypotensive. WBC 4.9, hemoglobin 10.4 (at baseline), platelet count 76K. Sodium 141, potassium 5.0, chloride 110, bicarb 16, anion gap 15, BUN 81, creatinine 6.0 (was 3.9 on 01/20/2020), glucose 271. High-sensitivity troponin 56 >56. EKG not suggestive of ACS. INR 1.2. Magnesium 1.7. BNP normal. SARS-CoV-2 PCR test positive. Lactic acid 1.5. Blood culture x2 pending. LFTs normal. VBG with pH 7.28. Lactic acid 1.2. Procalcitonin 1.63. Inflammatory markers elevated: D-dimer 2.95, LDH 315, ferritin 919, fibrinogen 592, CRP 17.3.  Chest x-ray showing findings consistent with developing infiltrate predominantly involving the right lung.  Assessment & Plan:   Principal Problem:   Pneumonia due to COVID-19 virus Active Problems:   AKI (acute kidney injury) ([facility])   Sepsis ([place])   Acute on chronic respiratory failure with hypoxia ([facility])   Edema of left lower leg    Sepsis and acute on chronic hypoxemic respiratory failure secondary to COVID-19 viral pneumonia: Meets criteria for sepsis-2 SIRS (fever, tachycardia, tachypnea) and  SARS-CoV-2 PCR test positive. No hypotension or lactic acidosis to suggest severe sepsis. Chest x-ray showing findings consistent with developing infiltrate predominantly involving the right lung. Inflammatory markers at presentation were elevated: D-dimer 2.95, LDH 315, ferritin 919, fibrinogen 592, CRP 17.3. Initially required BiPAP but now satting well on 5 L supplemental oxygen. -Had completed course of Remdesivir and steroids -Given elevated procalcitonin of 1.63 at presentation, continued on antibiotics for coverage of bacterial pneumonia as well. -CRP peaked over 19, down to 6.8 today -Supplemental oxygen as needed to keep oxygen saturation above 90% -Cont to follow inflammatory markers  AKI on CKD stage V: Patient has an AV graft in place. BUN 81, creatinine 6.0 (was 3.9 on 01/20/2020). Likely prerenal from dehydration in the setting of acute viral illness in the setting of home diuretic and ARB.  -Lasix, spironolactone, and losartan on hold.  -Nephrology consulted, now s/p [facility] cath. -per Nephrology, plan next treatment 2/35 or 5/73  Metabolic acidosis: Bicarb 16, anion gap 15. Likely due to AKI on CKD stage V. VBG with pH within normal range. -Cont HD per Nephrology, per above  Left lower extremity edema/concern for DVT: She has unilateral left lower extremity edema and D-dimer elevated in the setting of Covid infection. -Left lower extremity Doppler reviewed, neg for DVT -Continue prophylactic heparin as per above  Acute encephalopathy:   -Stat head CT noted to be neg , stat ABG reviewed, unremarkable -Conversing appropriately, much improved  Elevated troponin: High-sensitivity troponin slightly elevated but stable (56 > 56). EKG without acute ischemic changes. Troponin elevation likely due to demand ischemia in setting of Covid pneumonia. -continue Cardiac monitoring  Insulin-dependent diabetes mellitus -Check A1c. Sliding scale insulin very sensitive  ACHS.  Thrombocytopenia: Platelet count currently 76K, was 121K on 01/20/2020 but there are no other prior labs available for comparison. Thrombocytopenia likely related to chronic renal disease and now worse in the setting of acute viral illness. -plts trending up -Repeat cbc in AM  COPD: Stable. No wheezing. -cont with bronchodilators as needed  DVT prophylaxis: Heparin subq Code Status: Full Family Communication: Pt in room, family not at bedside  Status is: Inpatient  Remains inpatient appropriate because:Unsafe d/c plan and Inpatient level of care appropriate due to severity of illness   Dispo: The patient is from: Home              Anticipated d/c is to: Home              Anticipated d/c date is: 3 days              Patient currently is not medically stable to d/c.   Difficult to place patient No  Consultants:   Nephrology  IR  Procedures:     Antimicrobials: Anti-infectives (From admission, onward)   Start     Dose/Rate Route Frequency Ordered Stop   02/17/20 0600  ceFAZolin (ANCEF) IVPB 2g/100 mL premix        2 g 200 mL/hr over 30 Minutes Intravenous On call 02/16/20 1635 02/17/20 1810   02/16/20 1000  remdesivir 100 mg in sodium chloride 0.9 % 100 mL IVPB       "Followed by" Linked Group Details   100 mg 200 mL/hr over 30 Minutes Intravenous Daily 02/15/20 2256 02/19/20 1045   02/16/20 0000  remdesivir 200 mg in sodium chloride 0.9% 250 mL IVPB       "Followed by" Linked Group Details   200 mg 580 mL/hr over 30 Minutes Intravenous Once 02/15/20 2256 02/16/20 0239   02/15/20 2115  cefTRIAXone (ROCEPHIN) 2 g in sodium chloride 0.9 % 100 mL IVPB        2 g 200 mL/hr over 30 Minutes Intravenous Every 24 hours 02/15/20 2104 02/22/20 2114   02/15/20 2115  azithromycin (ZITHROMAX) 500 mg in sodium chloride 0.9 % 250 mL IVPB        500 mg 250 mL/hr over 60 Minutes Intravenous Every 24 hours 02/15/20 2104 02/22/20 2114      Subjective: Without complaints  this AM  Objective: Vitals:   02/20/20 0559 02/20/20 0749 02/20/20 1117 02/20/20 1635  BP: (!) 148/64 (!) 129/57 (!) 142/70 (!) 148/63  Pulse: 87 70 78 89  Resp: 19 18 17 20   Temp: 97.9 F (36.6 C) 98.5 F (36.9 C) 98.6 F (37 C) 98.7 F (37.1 C)  TempSrc: Axillary Oral Oral Oral  SpO2: 91% 92% 91% 90%  Weight:      Height:        Intake/Output Summary (Last 24 hours) at 02/20/2020 1817 Last data filed at 02/20/2020 0340 Gross per 24 hour  Intake 350 ml  Output --  Net 350 ml   Filed Weights   02/16/20 1611 02/18/20 0753 02/18/20 1020  Weight: 111.2 kg 111.2 kg 111 kg    Examination: General exam: Awake, laying in bed, in nad Respiratory system: Normal respiratory effort, no wheezing Cardiovascular system: regular rate, s1, s2 Gastrointestinal system: Soft, nondistended, positive BS Central nervous system: CN2-12 grossly intact, strength intact Extremities: Perfused, no clubbing Skin: Normal skin turgor, no notable skin lesions seen Psychiatry: Mood normal // no visual hallucinations   Data Reviewed: I have personally reviewed following labs and imaging studies  CBC: Recent Labs  Lab 02/16/20 1815 02/17/20 0219 02/18/20 0330 02/19/20 0104 02/20/20 0315  WBC 4.6 4.2 6.4 5.8 6.8  NEUTROABS  --  2.6 4.5 4.0 5.3  HGB 10.3* 8.5* 8.7* 8.3* 8.4*  HCT 33.9* 28.1* 28.6* 26.2* 26.1*  MCV 69.3* 68.2* 67.3* 65.0* 64.3*  PLT 84* 88* 127* 135* 947   Basic Metabolic Panel: Recent Labs  Lab 02/15/20 2043 02/15/20 2139 02/16/20 1815 02/17/20 0219 02/18/20 0330 02/19/20 0104 02/20/20 0315  NA 141   < > 144 143 142 138 139  K 5.0   < > 4.8 4.5 4.8 3.8 4.0  CL 110  --  109 111 109 104 104  CO2 16*  --  21* 18* 16* 20* 20*  GLUCOSE 271*  --  283* 344* 309* 221* 134*  BUN 81*  --  83* 92* 115* 77* 91*  CREATININE 6.05*  --  5.83* 6.24* 7.83* 6.34* 8.20*  CALCIUM 7.5*  --  7.6* 7.0* 7.5* 7.5* 7.7*  MG 1.7  --   --   --   --   --   --   PHOS  --   --  5.1*  --   --    --   --    < > = values in this interval not displayed.   GFR: Estimated Creatinine Clearance: 7.3 mL/min (A) (by C-G formula based on SCr of 8.2 mg/dL (H)). Liver Function Tests: Recent Labs  Lab 02/15/20 2104 02/16/20 1815 02/17/20 0219 02/18/20 0330 02/19/20 0104 02/20/20 0315  AST 29  --  26 27 32 20  ALT 11  --  13 9 10 8   ALKPHOS 58  --  46 48 69 59  BILITOT 0.6  --  0.5 0.8 0.4 0.6  PROT 7.7  --  6.3* 6.3* 6.2* 6.2*  ALBUMIN 2.9* 2.7* 2.3* 2.3* 2.3* 2.4*   No results for input(s): LIPASE, AMYLASE in the last 168 hours. Recent Labs  Lab 02/16/20 0505  AMMONIA 21   Coagulation Profile: Recent Labs  Lab 02/15/20 2043  INR 1.2   Cardiac Enzymes: No results for input(s): CKTOTAL, CKMB, CKMBINDEX, TROPONINI in the last 168 hours. BNP (last 3 results) No results for input(s): PROBNP in the last 8760 hours. HbA1C: No results for input(s): HGBA1C in the last 72 hours. CBG: Recent Labs  Lab 02/19/20 1634 02/19/20 2057 02/20/20 0558 02/20/20 1127 02/20/20 1644  GLUCAP 146* 77 154* 188* 251*   Lipid Profile: No results for input(s): CHOL, HDL, LDLCALC, TRIG, CHOLHDL, LDLDIRECT in the last 72 hours. Thyroid Function Tests: No results for input(s): TSH, T4TOTAL, FREET4, T3FREE, THYROIDAB in the last 72 hours. Anemia Panel: No results for input(s): VITAMINB12, FOLATE, FERRITIN, TIBC, IRON, RETICCTPCT in the last 72 hours. Sepsis Labs: Recent Labs  Lab 02/15/20 2104 02/15/20 2235 02/16/20 0053 02/17/20 0219 02/18/20 0330 02/19/20 0104 02/20/20 0315  PROCALCITON  --   --    < > 2.17 2.31 1.90 1.43  LATICACIDVEN 1.5 1.2  --   --   --   --   --    < > = values in this interval not displayed.    Recent Results (from the past 240 hour(s))  SARS Coronavirus 2 by RT PCR (hospital order, performed in [facility] hospital lab) Nasopharyngeal Nasopharyngeal Swab     Status: Abnormal   Collection Time: 02/15/20  8:44 PM   Specimen: Nasopharyngeal Swab  Result  Value Ref Range Status   SARS Coronavirus 2 POSITIVE (  A) NEGATIVE Final    Comment: CRITICAL RESULT CALLED TO, READ BACK BY AND VERIFIED WITH: DR Aldona Bar P. [id number] [id number] FCP (NOTE) SARS-CoV-2 target nucleic acids are DETECTED  SARS-CoV-2 RNA is generally detectable in upper respiratory specimens  during the acute phase of infection.  Positive results are indicative  of the presence of the identified virus, but do not rule out bacterial infection or co-infection with other pathogens not detected by the test.  Clinical correlation with patient history and  other diagnostic information is necessary to determine patient infection status.  The expected result is negative.  Fact Sheet for Patients:   StrictlyIdeas.no   Fact Sheet for Healthcare Providers:   BankingDealers.co.za    This test is not yet approved or cleared by the [place] FDA and  has been authorized for detection and/or diagnosis of SARS-CoV-2 by FDA under an Emergency Use Authorization (EUA).  This EUA will remain in effect (meaning th is test can be used) for the duration of  the COVID-19 declaration under Section 564(b)(1) of the Act, 21 U.S.C. section 360-bbb-3(b)(1), unless the authorization is terminated or revoked sooner.  Performed at [facility], [place] [address] [place], [place] [address]   Blood Culture (routine x 2)     Status: None (Preliminary result)   Collection Time: 02/15/20  9:04 PM   Specimen: BLOOD  Result Value Ref Range Status   Specimen Description BLOOD SITE NOT SPECIFIED  Final   Special Requests   Final    BOTTLES DRAWN AEROBIC AND ANAEROBIC Blood Culture adequate volume   Culture  Setup Time   Final    GRAM POSITIVE COCCI IN CHAINS AEROBIC BOTTLE ONLY Organism ID to follow CRITICAL RESULT CALLED TO, READ BACK BY AND VERIFIED WITH: A. [facility] PharmD 12:15 02/18/20 ([email or web address])    Culture   Final    GRAM POSITIVE COCCI IN  CHAINS IDENTIFICATION TO FOLLOW Performed at [facility], [place] [address], [place], [place] [address]    Report Status PENDING  Incomplete  Blood Culture ID Panel (Reflexed)     Status: None   Collection Time: 02/15/20  9:04 PM  Result Value Ref Range Status   Enterococcus faecalis NOT DETECTED NOT DETECTED Final   Enterococcus Faecium NOT DETECTED NOT DETECTED Final   Listeria monocytogenes NOT DETECTED NOT DETECTED Final   Staphylococcus species NOT DETECTED NOT DETECTED Final   Staphylococcus aureus (BCID) NOT DETECTED NOT DETECTED Final   Staphylococcus epidermidis NOT DETECTED NOT DETECTED Final   Staphylococcus lugdunensis NOT DETECTED NOT DETECTED Final   Streptococcus species NOT DETECTED NOT DETECTED Final   Streptococcus agalactiae NOT DETECTED NOT DETECTED Final   Streptococcus pneumoniae NOT DETECTED NOT DETECTED Final   Streptococcus pyogenes NOT DETECTED NOT DETECTED Final   A.calcoaceticus-baumannii NOT DETECTED NOT DETECTED Final   Bacteroides fragilis NOT DETECTED NOT DETECTED Final   Enterobacterales NOT DETECTED NOT DETECTED Final   Enterobacter cloacae complex NOT DETECTED NOT DETECTED Final   Escherichia coli NOT DETECTED NOT DETECTED Final   Klebsiella aerogenes NOT DETECTED NOT DETECTED Final   Klebsiella oxytoca NOT DETECTED NOT DETECTED Final   Klebsiella pneumoniae NOT DETECTED NOT DETECTED Final   Proteus species NOT DETECTED NOT DETECTED Final   Salmonella species NOT DETECTED NOT DETECTED Final   Serratia marcescens NOT DETECTED NOT DETECTED Final   Haemophilus influenzae NOT DETECTED NOT DETECTED Final   Neisseria meningitidis NOT DETECTED NOT DETECTED Final   Pseudomonas aeruginosa NOT DETECTED NOT DETECTED  Final   Stenotrophomonas maltophilia NOT DETECTED NOT DETECTED Final   Candida albicans NOT DETECTED NOT DETECTED Final   Candida auris NOT DETECTED NOT DETECTED Final   Candida glabrata NOT DETECTED NOT DETECTED Final   Candida krusei NOT  DETECTED NOT DETECTED Final   Candida parapsilosis NOT DETECTED NOT DETECTED Final   Candida tropicalis NOT DETECTED NOT DETECTED Final   Cryptococcus neoformans/gattii NOT DETECTED NOT DETECTED Final    Comment: Performed at [facility], [place] [address] [place], [place] [address]  Urine culture     Status: Abnormal   Collection Time: 02/16/20 11:19 AM   Specimen: In/Out Cath Urine  Result Value Ref Range Status   Specimen Description IN/OUT CATH URINE  Final   Special Requests   Final    NONE Performed at [facility], [place] [address], [place], [place] [address]    Culture 20,000 COLONIES/mL ESCHERICHIA COLI (A)  Final   Report Status 02/18/2020 FINAL  Final   Organism ID, Bacteria ESCHERICHIA COLI (A)  Final      Susceptibility   Escherichia coli - MIC*    AMPICILLIN >=32 RESISTANT Resistant     CEFAZOLIN <=4 SENSITIVE Sensitive     CEFEPIME <=0.12 SENSITIVE Sensitive     CEFTRIAXONE <=0.25 SENSITIVE Sensitive     CIPROFLOXACIN <=0.25 SENSITIVE Sensitive     GENTAMICIN >=16 RESISTANT Resistant     IMIPENEM 0.5 SENSITIVE Sensitive     NITROFURANTOIN <=16 SENSITIVE Sensitive     TRIMETH/SULFA >=320 RESISTANT Resistant     AMPICILLIN/SULBACTAM 16 INTERMEDIATE Intermediate     PIP/TAZO <=4 SENSITIVE Sensitive     * 20,000 COLONIES/mL ESCHERICHIA COLI     Radiology Studies: No results found.  Scheduled Meds: . amLODipine  10 mg Oral QPM  . vitamin C  500 mg Oral Daily  . aspirin EC  81 mg Oral Daily  . carvedilol  25 mg Oral BID  . Chlorhexidine Gluconate Cloth  6 each Topical Q0600  . cholecalciferol  1,000 Units Oral Daily  . darbepoetin (ARANESP) injection - DIALYSIS  60 mcg Intravenous Q Sun-HD  . furosemide  160 mg Oral BID  . heparin injection (subcutaneous)  7,500 Units Subcutaneous Q8H  . hydrALAZINE  100 mg Oral TID  . insulin aspart  0-5 Units Subcutaneous QHS  . insulin aspart  0-6 Units Subcutaneous TID WC  . insulin aspart  5 Units Subcutaneous  TID WC  . insulin detemir  18 Units Subcutaneous Q2200  . predniSONE  50 mg Oral Daily  . zinc sulfate  220 mg Oral Daily   Continuous Infusions: . sodium chloride    . sodium chloride    . azithromycin Stopped (02/20/20 0102)  . cefTRIAXone (ROCEPHIN)  IV Stopped (02/19/20 2247)     LOS: 5 days   Marylu Lund, MD [facility] Pager On Amion  If 7PM-7AM, please contact night-coverage 02/20/2020, 6:17 PM

## 2020-02-21 ENCOUNTER — Inpatient Hospital Stay (HOSPITAL_COMMUNITY): Payer: Medicare HMO

## 2020-02-21 DIAGNOSIS — J1282 Pneumonia due to coronavirus disease 2019: Secondary | ICD-10-CM | POA: Diagnosis not present

## 2020-02-21 DIAGNOSIS — U071 COVID-19: Secondary | ICD-10-CM | POA: Diagnosis not present

## 2020-02-21 DIAGNOSIS — J9621 Acute and chronic respiratory failure with hypoxia: Secondary | ICD-10-CM | POA: Diagnosis not present

## 2020-02-21 LAB — CBC WITH DIFFERENTIAL/PLATELET
Abs Immature Granulocytes: 2.08 10*3/uL — ABNORMAL HIGH (ref 0.00–0.07)
Basophils Absolute: 0 10*3/uL (ref 0.0–0.1)
Basophils Relative: 0 %
Eosinophils Absolute: 0 10*3/uL (ref 0.0–0.5)
Eosinophils Relative: 0 %
HCT: 25.9 % — ABNORMAL LOW (ref 36.0–46.0)
Hemoglobin: 7.8 g/dL — ABNORMAL LOW (ref 12.0–15.0)
Immature Granulocytes: 20 %
Lymphocytes Relative: 7 %
Lymphs Abs: 0.7 10*3/uL (ref 0.7–4.0)
MCH: 20.3 pg — ABNORMAL LOW (ref 26.0–34.0)
MCHC: 30.1 g/dL (ref 30.0–36.0)
MCV: 67.4 fL — ABNORMAL LOW (ref 80.0–100.0)
Monocytes Absolute: 1.7 10*3/uL — ABNORMAL HIGH (ref 0.1–1.0)
Monocytes Relative: 16 %
Neutro Abs: 5.9 10*3/uL (ref 1.7–7.7)
Neutrophils Relative %: 57 %
Platelets: 179 10*3/uL (ref 150–400)
RBC: 3.84 MIL/uL — ABNORMAL LOW (ref 3.87–5.11)
RDW: 21.2 % — ABNORMAL HIGH (ref 11.5–15.5)
WBC: 10.4 10*3/uL (ref 4.0–10.5)
nRBC: 0.3 % — ABNORMAL HIGH (ref 0.0–0.2)

## 2020-02-21 LAB — COMPREHENSIVE METABOLIC PANEL
ALT: 5 U/L (ref 0–44)
AST: 24 U/L (ref 15–41)
Albumin: 2.4 g/dL — ABNORMAL LOW (ref 3.5–5.0)
Alkaline Phosphatase: 62 U/L (ref 38–126)
Anion gap: 16 — ABNORMAL HIGH (ref 5–15)
BUN: 111 mg/dL — ABNORMAL HIGH (ref 8–23)
CO2: 19 mmol/L — ABNORMAL LOW (ref 22–32)
Calcium: 7.6 mg/dL — ABNORMAL LOW (ref 8.9–10.3)
Chloride: 105 mmol/L (ref 98–111)
Creatinine, Ser: 9.72 mg/dL — ABNORMAL HIGH (ref 0.44–1.00)
GFR, Estimated: 4 mL/min — ABNORMAL LOW (ref >60)
Glucose, Bld: 203 mg/dL — ABNORMAL HIGH (ref 70–99)
Potassium: 4.6 mmol/L (ref 3.5–5.1)
Sodium: 140 mmol/L (ref 135–145)
Total Bilirubin: 0.3 mg/dL (ref 0.3–1.2)
Total Protein: 6.1 g/dL — ABNORMAL LOW (ref 6.5–8.1)

## 2020-02-21 LAB — GLUCOSE, CAPILLARY
Glucose-Capillary: 179 mg/dL — ABNORMAL HIGH (ref 70–99)
Glucose-Capillary: 155 mg/dL — ABNORMAL HIGH (ref 70–99)
Glucose-Capillary: 122 mg/dL — ABNORMAL HIGH (ref 70–99)
Glucose-Capillary: 138 mg/dL — ABNORMAL HIGH (ref 70–99)

## 2020-02-21 LAB — C-REACTIVE PROTEIN: CRP: 4.4 mg/dL — ABNORMAL HIGH (ref <1.0)

## 2020-02-21 LAB — D-DIMER, QUANTITATIVE: D-Dimer, Quant: 1.89 ug/mL-FEU — ABNORMAL HIGH (ref 0.00–0.50)

## 2020-02-21 MED ORDER — HEPARIN SODIUM (PORCINE) 1000 UNIT/ML IJ SOLN
INTRAMUSCULAR | Status: AC
  Filled 2020-02-21: qty 4

## 2020-02-21 MED ORDER — DARBEPOETIN ALFA 60 MCG/0.3ML IJ SOSY: 60.0000 ug | PREFILLED_SYRINGE | INTRAMUSCULAR | Status: DC

## 2020-02-21 NOTE — Progress Notes (Signed)
PROGRESS NOTE    Joann Gutierrez  JOA:[id number] DOB: 1945-09-19 DOA: 02/15/2020 PCP: Andree Moro, DO    Brief Narrative:  75 y.o. 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 When EMS arrived she was satting 67-70% on her home CPAP machine which seemed to have a poor seal. On EMS CPAP, sats improved to 93-95%. Blood pressure elevated at 180/100 ED Course: Patient initially required BiPAP but later satting well on 5 L oxygen via nasal cannula. Febrile with temperature 101.8 F. Slightly tachycardic on arrival. Tachypneic. Not hypotensive. WBC 4.9, hemoglobin 10.4 (at baseline), platelet count 76K. Sodium 141, potassium 5.0, chloride 110, bicarb 16, anion gap 15, BUN 81, creatinine 6.0 (was 3.9 on 01/20/2020), glucose 271. High-sensitivity troponin 56 >56. EKG not suggestive of ACS. INR 1.2. Magnesium 1.7. BNP normal. SARS-CoV-2 PCR test positive. Lactic acid 1.5. Blood culture x2 pending. LFTs normal. VBG with pH 7.28. Lactic acid 1.2. Procalcitonin 1.63. Inflammatory markers elevated: D-dimer 2.95, LDH 315, ferritin 919, fibrinogen 592, CRP 17.3.  Chest x-ray showing findings consistent with developing infiltrate predominantly involving the right lung.  Assessment & Plan:   Principal Problem:   Pneumonia due to COVID-19 virus Active Problems:   AKI (acute kidney injury) ([place])   Sepsis ([place])   Acute on chronic respiratory failure with hypoxia (HCC)   Edema of left lower leg    Sepsis and acute on chronic hypoxemic respiratory failure secondary to COVID-19 viral pneumonia: Meets criteria for sepsis-2 SIRS (fever, tachycardia, tachypnea) and  SARS-CoV-2 PCR test positive. No hypotension or lactic acidosis to suggest severe sepsis. Chest x-ray showing findings consistent with developing infiltrate predominantly involving the right lung. Inflammatory markers at presentation were elevated: D-dimer 2.95, LDH 315, ferritin 919, fibrinogen 592, CRP 17.3. Initially required BiPAP but now satting well on 5 L supplemental oxygen. -Had completed course of Remdesivir and steroids -Given elevated procalcitonin of 1.63 at presentation, continued on antibiotics for coverage of bacterial pneumonia as well. -CRP peaked over 19, down to 4.4 today -Supplemental oxygen as needed to keep oxygen saturation above 90% -Cont to follow inflammatory markers  AKI on CKD stage V: Patient has an AV graft in place. BUN 81, creatinine 6.0 (was 3.9 on 01/20/2020). Likely prerenal from dehydration in the setting of acute viral illness in the setting of home diuretic and ARB.  -Lasix, spironolactone, and losartan on hold.  -Nephrology consulted, now s/p [facility] cath. -per Nephrology following, for HD today  Metabolic acidosis: Bicarb 16, anion gap 15. Likely due to AKI on CKD stage V. VBG with pH within normal range. -Cont HD per Nephrology, per above  Left lower extremity edema/concern for DVT: She has unilateral left lower extremity edema and D-dimer elevated in the setting of Covid infection. -Left lower extremity Doppler reviewed, neg for DVT -Continue prophylactic heparin as per above  Acute encephalopathy:   -Stat head CT noted to be neg , stat ABG reviewed, unremarkable -Conversing appropriately, overall improved  Elevated troponin: High-sensitivity troponin slightly elevated but stable (56 > 56). EKG without acute ischemic changes. Troponin elevation likely due to demand ischemia in setting of Covid pneumonia. -continue Cardiac monitoring  Insulin-dependent diabetes mellitus -a1c 7.4. Sliding scale insulin very sensitive ACHS.  Thrombocytopenia:  Platelet count currently 76K, was 121K on 01/20/2020 but there are no other prior labs available for comparison. Thrombocytopenia likely related to chronic renal disease and now worse in the setting of acute viral illness. -normalized -Repeat cbc in AM  COPD: Stable. No wheezing. -cont with bronchodilators as needed  DVT prophylaxis: Heparin subq Code Status: Full Family Communication: Pt in room, family not at bedside  Status is: Inpatient  Remains inpatient appropriate because:Unsafe d/c plan and Inpatient level of care appropriate due to severity of illness   Dispo: The patient is from: Home              Anticipated d/c is to: Home              Anticipated d/c date is: 3 days              Patient currently is not medically stable to d/c.   Difficult to place patient No  Consultants:   Nephrology  IR  Procedures:     Antimicrobials: Anti-infectives (From admission, onward)   Start     Dose/Rate Route Frequency Ordered Stop   02/17/20 0600  ceFAZolin (ANCEF) IVPB 2g/100 mL premix        2 g 200 mL/hr over 30 Minutes Intravenous On call 02/16/20 1635 02/17/20 1810   02/16/20 1000  remdesivir 100 mg in sodium chloride 0.9 % 100 mL IVPB       "Followed by" Linked Group Details   100 mg 200 mL/hr over 30 Minutes Intravenous Daily 02/15/20 2256 02/19/20 1045   02/16/20 0000  remdesivir 200 mg in sodium chloride 0.9% 250 mL IVPB       "Followed by" Linked Group Details   200 mg 580 mL/hr over 30 Minutes Intravenous Once 02/15/20 2256 02/16/20 0239   02/15/20 2115  cefTRIAXone (ROCEPHIN) 2 g in sodium chloride 0.9 % 100 mL IVPB        2 g 200 mL/hr over 30 Minutes Intravenous Every 24 hours 02/15/20 2104 02/22/20 2114   02/15/20 2115  azithromycin (ZITHROMAX) 500 mg in sodium chloride 0.9 % 250 mL IVPB        500 mg 250 mL/hr over 60 Minutes Intravenous Every 24 hours 02/15/20 2104 02/22/20 2114      Subjective: Complains of feeling generally  bad  Objective: Vitals:   02/21/20 1530 02/21/20 1555 02/21/20 1600 02/21/20 1607  BP: (!) 143/99 (!) 105/38 124/73 125/79  Pulse: 76 62 95 79  Resp: 18  18 19   Temp:   98.3 F (36.8 C) 97.6 F (36.4 C)  TempSrc:   Axillary Axillary  SpO2: 96% 96%  92%  Weight:  117.1 kg    Height:        Intake/Output Summary (Last 24 hours) at 02/21/2020 1723 Last data filed at 02/21/2020 1555 Gross per 24 hour  Intake 344.94 ml  Output 0 ml  Net 344.94 ml   Filed Weights   02/21/20 1200 02/21/20 1319 02/21/20 1555  Weight: 117.1 kg 117.1 kg 117.1 kg    Examination: General exam: Conversant, in no acute distress Respiratory system: normal chest rise, clear, no audible wheezing Cardiovascular system: regular rhythm, s1-s2 Gastrointestinal system: Nondistended, nontender, pos BS Central nervous system: No seizures, no tremors Extremities: No cyanosis, no joint deformities Skin: No rashes, no pallor Psychiatry: Affect normal // no auditory hallucinations   Data Reviewed: I have personally reviewed following labs and imaging studies  CBC: Recent Labs  Lab 02/17/20 0219 02/18/20 0330 02/19/20  0104 02/20/20 0315 02/21/20 0124  WBC 4.2 6.4 5.8 6.8 10.4  NEUTROABS 2.6 4.5 4.0 5.3 5.9  HGB 8.5* 8.7* 8.3* 8.4* 7.8*  HCT 28.1* 28.6* 26.2* 26.1* 25.9*  MCV 68.2* 67.3* 65.0* 64.3* 67.4*  PLT 88* 127* 135* 167 956   Basic Metabolic Panel: Recent Labs  Lab 02/15/20 2043 02/15/20 2139 02/16/20 1815 02/17/20 0219 02/18/20 0330 02/19/20 0104 02/20/20 0315 02/21/20 0124  NA 141   < > 144 143 142 138 139 140  K 5.0   < > 4.8 4.5 4.8 3.8 4.0 4.6  CL 110  --  109 111 109 104 104 105  CO2 16*  --  21* 18* 16* 20* 20* 19*  GLUCOSE 271*  --  283* 344* 309* 221* 134* 203*  BUN 81*  --  83* 92* 115* 77* 91* 111*  CREATININE 6.05*  --  5.83* 6.24* 7.83* 6.34* 8.20* 9.72*  CALCIUM 7.5*  --  7.6* 7.0* 7.5* 7.5* 7.7* 7.6*  MG 1.7  --   --   --   --   --   --   --   PHOS  --   --  5.1*  --    --   --   --   --    < > = values in this interval not displayed.   GFR: Estimated Creatinine Clearance: 6.4 mL/min (A) (by C-G formula based on SCr of 9.72 mg/dL (H)). Liver Function Tests: Recent Labs  Lab 02/17/20 0219 02/18/20 0330 02/19/20 0104 02/20/20 0315 02/21/20 0124  AST 26 27 32 20 24  ALT 13 9 10 8 5   ALKPHOS 46 48 69 59 62  BILITOT 0.5 0.8 0.4 0.6 0.3  PROT 6.3* 6.3* 6.2* 6.2* 6.1*  ALBUMIN 2.3* 2.3* 2.3* 2.4* 2.4*   No results for input(s): LIPASE, AMYLASE in the last 168 hours. Recent Labs  Lab 02/16/20 0505  AMMONIA 21   Coagulation Profile: Recent Labs  Lab 02/15/20 2043  INR 1.2   Cardiac Enzymes: No results for input(s): CKTOTAL, CKMB, CKMBINDEX, TROPONINI in the last 168 hours. BNP (last 3 results) No results for input(s): PROBNP in the last 8760 hours. HbA1C: No results for input(s): HGBA1C in the last 72 hours. CBG: Recent Labs  Lab 02/20/20 1644 02/20/20 2134 02/21/20 0636 02/21/20 1155 02/21/20 1613  GLUCAP 251* 229* 179* 155* 122*   Lipid Profile: No results for input(s): CHOL, HDL, LDLCALC, TRIG, CHOLHDL, LDLDIRECT in the last 72 hours. Thyroid Function Tests: No results for input(s): TSH, T4TOTAL, FREET4, T3FREE, THYROIDAB in the last 72 hours. Anemia Panel: No results for input(s): VITAMINB12, FOLATE, FERRITIN, TIBC, IRON, RETICCTPCT in the last 72 hours. Sepsis Labs: Recent Labs  Lab 02/15/20 2104 02/15/20 2235 02/16/20 0053 02/17/20 0219 02/18/20 0330 02/19/20 0104 02/20/20 0315  PROCALCITON  --   --    < > 2.17 2.31 1.90 1.43  LATICACIDVEN 1.5 1.2  --   --   --   --   --    < > = values in this interval not displayed.    Recent Results (from the past 240 hour(s))  SARS Coronavirus 2 by RT PCR (hospital order, performed in [facility] hospital lab) Nasopharyngeal Nasopharyngeal Swab     Status: Abnormal   Collection Time: 02/15/20  8:44 PM   Specimen: Nasopharyngeal Swab  Result Value Ref Range Status   SARS  Coronavirus 2 POSITIVE (A) NEGATIVE Final    Comment: CRITICAL RESULT CALLED TO, READ BACK BY  AND VERIFIED WITH: DR Gar Ponto [id number] FCP (NOTE) SARS-CoV-2 target nucleic acids are DETECTED  SARS-CoV-2 RNA is generally detectable in upper respiratory specimens  during the acute phase of infection.  Positive results are indicative  of the presence of the identified virus, but do not rule out bacterial infection or co-infection with other pathogens not detected by the test.  Clinical correlation with patient history and  other diagnostic information is necessary to determine patient infection status.  The expected result is negative.  Fact Sheet for Patients:   StrictlyIdeas.no   Fact Sheet for Healthcare Providers:   BankingDealers.co.za    This test is not yet approved or cleared by the [place] FDA and  has been authorized for detection and/or diagnosis of SARS-CoV-2 by FDA under an Emergency Use Authorization (EUA).  This EUA will remain in effect (meaning th is test can be used) for the duration of  the COVID-19 declaration under Section 564(b)(1) of the Act, 21 U.S.C. section 360-bbb-3(b)(1), unless the authorization is terminated or revoked sooner.  Performed at [facility], [place] [address] [place], [place] [address]   Blood Culture (routine x 2)     Status: None (Preliminary result)   Collection Time: 02/15/20  9:04 PM   Specimen: BLOOD  Result Value Ref Range Status   Specimen Description BLOOD SITE NOT SPECIFIED  Final   Special Requests   Final    BOTTLES DRAWN AEROBIC AND ANAEROBIC Blood Culture adequate volume   Culture  Setup Time   Final    GRAM POSITIVE COCCI IN CHAINS AEROBIC BOTTLE ONLY Organism ID to follow CRITICAL RESULT CALLED TO, READ BACK BY AND VERIFIED WITH: A. [facility] PharmD 12:15 02/18/20 ([email or web address])    Culture   Final    GRAM POSITIVE COCCI IN CHAINS IDENTIFICATION TO FOLLOW Performed  at [facility], [place] [address], [place], [place] [address]    Report Status PENDING  Incomplete  Blood Culture ID Panel (Reflexed)     Status: None   Collection Time: 02/15/20  9:04 PM  Result Value Ref Range Status   Enterococcus faecalis NOT DETECTED NOT DETECTED Final   Enterococcus Faecium NOT DETECTED NOT DETECTED Final   Listeria monocytogenes NOT DETECTED NOT DETECTED Final   Staphylococcus species NOT DETECTED NOT DETECTED Final   Staphylococcus aureus (BCID) NOT DETECTED NOT DETECTED Final   Staphylococcus epidermidis NOT DETECTED NOT DETECTED Final   Staphylococcus lugdunensis NOT DETECTED NOT DETECTED Final   Streptococcus species NOT DETECTED NOT DETECTED Final   Streptococcus agalactiae NOT DETECTED NOT DETECTED Final   Streptococcus pneumoniae NOT DETECTED NOT DETECTED Final   Streptococcus pyogenes NOT DETECTED NOT DETECTED Final   A.calcoaceticus-baumannii NOT DETECTED NOT DETECTED Final   Bacteroides fragilis NOT DETECTED NOT DETECTED Final   Enterobacterales NOT DETECTED NOT DETECTED Final   Enterobacter cloacae complex NOT DETECTED NOT DETECTED Final   Escherichia coli NOT DETECTED NOT DETECTED Final   Klebsiella aerogenes NOT DETECTED NOT DETECTED Final   Klebsiella oxytoca NOT DETECTED NOT DETECTED Final   Klebsiella pneumoniae NOT DETECTED NOT DETECTED Final   Proteus species NOT DETECTED NOT DETECTED Final   Salmonella species NOT DETECTED NOT DETECTED Final   Serratia marcescens NOT DETECTED NOT DETECTED Final   Haemophilus influenzae NOT DETECTED NOT DETECTED Final   Neisseria meningitidis NOT DETECTED NOT DETECTED Final   Pseudomonas aeruginosa NOT DETECTED NOT DETECTED Final   Stenotrophomonas maltophilia NOT DETECTED NOT DETECTED Final   Candida albicans  NOT DETECTED NOT DETECTED Final   Candida auris NOT DETECTED NOT DETECTED Final   Candida glabrata NOT DETECTED NOT DETECTED Final   Candida krusei NOT DETECTED NOT DETECTED Final   Candida  parapsilosis NOT DETECTED NOT DETECTED Final   Candida tropicalis NOT DETECTED NOT DETECTED Final   Cryptococcus neoformans/gattii NOT DETECTED NOT DETECTED Final    Comment: Performed at [facility], [place] [address] [place], [place] [address]  Urine culture     Status: Abnormal   Collection Time: 02/16/20 11:19 AM   Specimen: In/Out Cath Urine  Result Value Ref Range Status   Specimen Description IN/OUT CATH URINE  Final   Special Requests   Final    NONE Performed at [facility], [place] [address], [place], [place] [address]    Culture 20,000 COLONIES/mL ESCHERICHIA COLI (A)  Final   Report Status 02/18/2020 FINAL  Final   Organism ID, Bacteria ESCHERICHIA COLI (A)  Final      Susceptibility   Escherichia coli - MIC*    AMPICILLIN >=32 RESISTANT Resistant     CEFAZOLIN <=4 SENSITIVE Sensitive     CEFEPIME <=0.12 SENSITIVE Sensitive     CEFTRIAXONE <=0.25 SENSITIVE Sensitive     CIPROFLOXACIN <=0.25 SENSITIVE Sensitive     GENTAMICIN >=16 RESISTANT Resistant     IMIPENEM 0.5 SENSITIVE Sensitive     NITROFURANTOIN <=16 SENSITIVE Sensitive     TRIMETH/SULFA >=320 RESISTANT Resistant     AMPICILLIN/SULBACTAM 16 INTERMEDIATE Intermediate     PIP/TAZO <=4 SENSITIVE Sensitive     * 20,000 COLONIES/mL ESCHERICHIA COLI     Radiology Studies: DG Abd 1 View  Result Date: 02/21/2020 CLINICAL DATA:  Abdominal pain. EXAM: ABDOMEN - 1 VIEW COMPARISON:  None. FINDINGS: The bowel gas pattern is normal. No radio-opaque calculi or other significant radiographic abnormality are seen. IMPRESSION: Negative. Electronically Signed   By: Kerby Moors M.D.   On: 02/21/2020 11:28    Scheduled Meds: . amLODipine  10 mg Oral QPM  . vitamin C  500 mg Oral Daily  . aspirin EC  81 mg Oral Daily  . carvedilol  25 mg Oral BID  . Chlorhexidine Gluconate Cloth  6 each Topical Q0600  . cholecalciferol  1,000 Units Oral Daily  . [START ON 02/25/2020] darbepoetin (ARANESP) injection - DIALYSIS  60  mcg Intravenous Q Fri-HD  . heparin injection (subcutaneous)  7,500 Units Subcutaneous Q8H  . heparin sodium (porcine)      . hydrALAZINE  100 mg Oral TID  . insulin aspart  0-5 Units Subcutaneous QHS  . insulin aspart  0-6 Units Subcutaneous TID WC  . insulin aspart  5 Units Subcutaneous TID WC  . insulin detemir  18 Units Subcutaneous Q2200  . predniSONE  50 mg Oral Daily  . zinc sulfate  220 mg Oral Daily   Continuous Infusions: . azithromycin Stopped (02/21/20 0058)  . cefTRIAXone (ROCEPHIN)  IV Stopped (02/20/20 2350)     LOS: 6 days   Marylu Lund, MD [facility] Pager On Amion  If 7PM-7AM, please contact night-coverage 02/21/2020, 5:23 PM

## 2020-02-21 NOTE — Progress Notes (Signed)
Inpatient Diabetes Program Recommendations  AACE/ADA: New Consensus Statement on Inpatient Glycemic Control (2015)  Target Ranges:  Prepandial:   less than 140 mg/dL      Peak postprandial:   less than 180 mg/dL (1-2 hours)      Critically ill patients:  140 - 180 mg/dL   Lab Results  Component Value Date   GLUCAP 179 (H) 02/21/2020   HGBA1C 7.4 (H) 02/16/2020    Review of Glycemic Control Results for AMRI, LIEN (MRN [id number]) as of 02/21/2020 10:17  Ref. Range 02/20/2020 16:44 02/20/2020 21:34 02/21/2020 06:36  Glucose-Capillary Latest Ref Range: 70 - 99 mg/dL 251 (H) 229 (H) 179 (H)   Diabetes history: Type 2 DM Outpatient Diabetes medications: Levemir 30 units QHS, Novolog 5 units TID Current orders for Inpatient glycemic control: Levemir 18 units QHS, Novolog 5 units TID, Novolog 0-6 units TID, Novolog 0-5 units QHS Prednisone 5 mg QD  Inpatient Diabetes Program Recommendations:    Now that Solumedrol completed, may want to consider reducing meal coverage to Novolog 2 units TID (assuming patient is consuming >50% of meal).   Thanks, Bronson Curb, MSN, RNC-OB Diabetes Coordinator [phone] (8a-5p)

## 2020-02-21 NOTE — Progress Notes (Signed)
[facility] Progress Note   Subjective:  No UOP recorded.   HD rescheduled for today.  BUN and crt cont to rise  Objective Vitals:   02/20/20 2005 02/20/20 2300 02/21/20 0421 02/21/20 0827  BP: 136/70 132/68 133/78 (!) 144/81  Pulse: 81 83 68 86  Resp: 16 19 18 18   Temp: 98.9 F (37.2 C) 98.2 F (36.8 C) 98.4 F (36.9 C) 98.3 F (36.8 C)  TempSrc: Oral Oral Oral Oral  SpO2: 92% 91% 90% 92%  Weight:      Height:       Physical Exam-  Not examined today 1/31-  See below for exam 1/30 General: sleeping, arouses easily but seems a bit confused Heart: RRR Lungs: clearer ant, on 3L, normal WOB Abdomen: soft Extremities: minor edema Dialysis Access:  RUE AVF + infiltration venous; +t/b present, RIJ [facility] c/d/i   Additional Objective Labs: Basic Metabolic Panel: Recent Labs  Lab 02/16/20 1815 02/17/20 0219 02/19/20 0104 02/20/20 0315 02/21/20 0124  NA 144   < > 138 139 140  K 4.8   < > 3.8 4.0 4.6  CL 109   < > 104 104 105  CO2 21*   < > 20* 20* 19*  GLUCOSE 283*   < > 221* 134* 203*  BUN 83*   < > 77* 91* 111*  CREATININE 5.83*   < > 6.34* 8.20* 9.72*  CALCIUM 7.6*   < > 7.5* 7.7* 7.6*  PHOS 5.1*  --   --   --   --    < > = values in this interval not displayed.   Liver Function Tests: Recent Labs  Lab 02/19/20 0104 02/20/20 0315 02/21/20 0124  AST 32 20 24  ALT 10 8 5   ALKPHOS 69 59 62  BILITOT 0.4 0.6 0.3  PROT 6.2* 6.2* 6.1*  ALBUMIN 2.3* 2.4* 2.4*   No results for input(s): LIPASE, AMYLASE in the last 168 hours. CBC: Recent Labs  Lab 02/17/20 0219 02/18/20 0330 02/19/20 0104 02/20/20 0315 02/21/20 0124  WBC 4.2 6.4 5.8 6.8 10.4  NEUTROABS 2.6 4.5 4.0 5.3 5.9  HGB 8.5* 8.7* 8.3* 8.4* 7.8*  HCT 28.1* 28.6* 26.2* 26.1* 25.9*  MCV 68.2* 67.3* 65.0* 64.3* 67.4*  PLT 88* 127* 135* 167 179   Blood Culture    Component Value Date/Time   SDES IN/OUT CATH URINE 02/16/2020 1119   SPECREQUEST  02/16/2020 1119    NONE Performed at  [place] [address], [place], [place] [address]    CULT 20,000 COLONIES/mL ESCHERICHIA COLI (A) 02/16/2020 1119   REPTSTATUS 02/18/2020 FINAL 02/16/2020 1119    Cardiac Enzymes: No results for input(s): CKTOTAL, CKMB, CKMBINDEX, TROPONINI in the last 168 hours. CBG: Recent Labs  Lab 02/20/20 0558 02/20/20 1127 02/20/20 1644 02/20/20 2134 02/21/20 0636  GLUCAP 154* 188* 251* 229* 179*   Iron Studies:  No results for input(s): IRON, TIBC, TRANSFERRIN, FERRITIN in the last 72 hours. @lablastinr3 @ Studies/Results: No results found. Medications: . sodium chloride    . sodium chloride    . azithromycin Stopped (02/21/20 0058)  . cefTRIAXone (ROCEPHIN)  IV Stopped (02/20/20 2350)   . amLODipine  10 mg Oral QPM  . vitamin C  500 mg Oral Daily  . aspirin EC  81 mg Oral Daily  . carvedilol  25 mg Oral BID  . Chlorhexidine Gluconate Cloth  6 each Topical Q0600  . cholecalciferol  1,000 Units Oral Daily  . [START ON  02/25/2020] darbepoetin (ARANESP) injection - DIALYSIS  60 mcg Intravenous Q Fri-HD  . furosemide  160 mg Oral BID  . heparin injection (subcutaneous)  7,500 Units Subcutaneous Q8H  . hydrALAZINE  100 mg Oral TID  . insulin aspart  0-5 Units Subcutaneous QHS  . insulin aspart  0-6 Units Subcutaneous TID WC  . insulin aspart  5 Units Subcutaneous TID WC  . insulin detemir  18 Units Subcutaneous Q2200  . predniSONE  50 mg Oral Daily  . zinc sulfate  220 mg Oral Daily   Assessment/Plan **Hypoxic respiratory failure:  COVID+, being treated per primary with remdesivir and steroids.  Ok on [place] currently weaned from 6 to 3. Does have underlying COPD as well.  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  Failed fluid challenge.  Initiated HD - AVF infiltrated 1/26, s/p TDC placement 1/27 with HD #1 1/28.  Treatment #2 should be today.   AVF will need to be  addressed at a future date when she's out of COVID isolation.  CLIP to outpt unit.  stop diuretics   **AMS:  Suspect multifactorial as greatly improved even prior to dialysis but uremia likely playing a role.  **DM: per primary, A1c 7.2  **LLE edema: DVT ruled out  **Anemia:  Stable in the 8s.  Iron replete.  No obvious bleeding.  Started ESA with HD QFriday.  Bones-  Last PTH 175 on 12/30-  Last phos 5.1 no meds as of yet    Joann Gutierrez  02/21/2020, 9:03 AM  [facility] Pager: [phone]

## 2020-02-22 DIAGNOSIS — J9601 Acute respiratory failure with hypoxia: Secondary | ICD-10-CM | POA: Diagnosis not present

## 2020-02-22 DIAGNOSIS — J1282 Pneumonia due to coronavirus disease 2019: Secondary | ICD-10-CM | POA: Diagnosis not present

## 2020-02-22 DIAGNOSIS — U071 COVID-19: Secondary | ICD-10-CM | POA: Diagnosis not present

## 2020-02-22 DIAGNOSIS — N179 Acute kidney failure, unspecified: Secondary | ICD-10-CM | POA: Diagnosis not present

## 2020-02-22 LAB — CULTURE, BLOOD (ROUTINE X 2): Special Requests: ADEQUATE

## 2020-02-22 LAB — GLUCOSE, CAPILLARY
Glucose-Capillary: 58 mg/dL — ABNORMAL LOW (ref 70–99)
Glucose-Capillary: 115 mg/dL — ABNORMAL HIGH (ref 70–99)
Glucose-Capillary: 48 mg/dL — ABNORMAL LOW (ref 70–99)
Glucose-Capillary: 77 mg/dL (ref 70–99)
Glucose-Capillary: 135 mg/dL — ABNORMAL HIGH (ref 70–99)
Glucose-Capillary: 69 mg/dL — ABNORMAL LOW (ref 70–99)
Glucose-Capillary: 127 mg/dL — ABNORMAL HIGH (ref 70–99)
Glucose-Capillary: 253 mg/dL — ABNORMAL HIGH (ref 70–99)

## 2020-02-22 MED ORDER — GLUCOSE 40 % PO GEL
ORAL | Status: AC
  Filled 2020-02-22: qty 1

## 2020-02-22 MED ORDER — DEXTROSE 50 % IV SOLN
INTRAVENOUS | Status: AC
  Administered 2020-02-22: 50 mL
  Filled 2020-02-22: qty 50

## 2020-02-22 MED ORDER — DEXTROSE 50 % IV SOLN
12.5000 g | INTRAVENOUS | Status: AC
  Administered 2020-02-22: 12.5 g via INTRAVENOUS

## 2020-02-22 MED ORDER — HALOPERIDOL LACTATE 5 MG/ML IJ SOLN
2.5000 mg | Freq: Once | INTRAMUSCULAR | Status: AC
  Administered 2020-02-22: 2.5 mg via INTRAVENOUS
  Filled 2020-02-22: qty 1

## 2020-02-22 MED ORDER — LIP MEDEX EX OINT
TOPICAL_OINTMENT | CUTANEOUS | Status: DC | PRN
  Filled 2020-02-22 (×2): qty 7

## 2020-02-22 MED ORDER — GLUCOSE 40 % PO GEL
2.0000 | ORAL | Status: AC
  Administered 2020-02-22: 75 g via ORAL

## 2020-02-22 MED ORDER — INSULIN DETEMIR 100 UNIT/ML ~~LOC~~ SOLN
14.0000 [IU] | Freq: Every day | SUBCUTANEOUS | Status: DC
  Administered 2020-02-22 – 2020-02-28 (×7): 14 [IU] via SUBCUTANEOUS
  Filled 2020-02-22 (×9): qty 0.14

## 2020-02-22 MED ORDER — DARBEPOETIN ALFA 150 MCG/0.3ML IJ SOSY
150.0000 ug | PREFILLED_SYRINGE | INTRAMUSCULAR | Status: DC
  Administered 2020-02-25: 150 ug via INTRAVENOUS
  Filled 2020-02-22: qty 0.3

## 2020-02-22 MED ORDER — DEXTROSE 50 % IV SOLN
INTRAVENOUS | Status: AC
  Filled 2020-02-22: qty 50

## 2020-02-22 MED ORDER — CHLORHEXIDINE GLUCONATE CLOTH 2 % EX PADS
6.0000 | MEDICATED_PAD | Freq: Every day | CUTANEOUS | Status: DC
  Administered 2020-02-23: 6 via TOPICAL

## 2020-02-22 NOTE — Progress Notes (Signed)
HOSPITAL MEDICINE OVERNIGHT EVENT NOTE    Patient complaining of shortness of breath, stating that she cannot breathe when she tries to sleep without her home CPAP device.  Patient does have a confirmed history of sleep apnea and in the outpatient setting uses CPAP nightly.  Nursing states that patient's oxygen requirements are unchanged and patient is not in respiratory distress.  We will therefore order CPAP nightly for sleep, likely DreamStation device considering patient is Covid.  We will monitor for response.  Joann Emerald  MD [facility]

## 2020-02-22 NOTE — Progress Notes (Signed)
Patient remains confused and very combative trying to punch this RN, and other RNs in the room, Haldol 2.5mg  IV given as ordered, will continue to monitor.

## 2020-02-22 NOTE — Progress Notes (Signed)
[facility] Progress Note   Subjective:  No UOP recorded.   HD yest with no fluid removal -  Reportedly became combative overnight-  Now on haldol -  Today she says she is miserable but not able to elaborate   Objective Vitals:   02/22/20 0546 02/22/20 0552 02/22/20 0911 02/22/20 1127  BP:   (!) 165/77 (!) 112/98  Pulse:   80 72  Resp:   20 (!) 21  Temp: 98.4 F (36.9 C)  98.2 F (36.8 C) 98.4 F (36.9 C)  TempSrc:   Oral Oral  SpO2:   98% 95%  Weight:  113.3 kg    Height:       Physical Exam-   General: alert, says she is miserable-  Did not recall automatically that she has been getting dialysis  Heart: RRR Lungs: clearer ant, on 3L, normal WOB Abdomen: soft Extremities: mild edema Dialysis Access:  RUE AVF + infiltration venous; +t/b present, RIJ [facility] c/d/i   Additional Objective Labs: Basic Metabolic Panel: Recent Labs  Lab 02/16/20 1815 02/17/20 0219 02/19/20 0104 02/20/20 0315 02/21/20 0124  NA 144   < > 138 139 140  K 4.8   < > 3.8 4.0 4.6  CL 109   < > 104 104 105  CO2 21*   < > 20* 20* 19*  GLUCOSE 283*   < > 221* 134* 203*  BUN 83*   < > 77* 91* 111*  CREATININE 5.83*   < > 6.34* 8.20* 9.72*  CALCIUM 7.6*   < > 7.5* 7.7* 7.6*  PHOS 5.1*  --   --   --   --    < > = values in this interval not displayed.   Liver Function Tests: Recent Labs  Lab 02/19/20 0104 02/20/20 0315 02/21/20 0124  AST 32 20 24  ALT 10 8 5   ALKPHOS 69 59 62  BILITOT 0.4 0.6 0.3  PROT 6.2* 6.2* 6.1*  ALBUMIN 2.3* 2.4* 2.4*   No results for input(s): LIPASE, AMYLASE in the last 168 hours. CBC: Recent Labs  Lab 02/17/20 0219 02/18/20 0330 02/19/20 0104 02/20/20 0315 02/21/20 0124  WBC 4.2 6.4 5.8 6.8 10.4  NEUTROABS 2.6 4.5 4.0 5.3 5.9  HGB 8.5* 8.7* 8.3* 8.4* 7.8*  HCT 28.1* 28.6* 26.2* 26.1* 25.9*  MCV 68.2* 67.3* 65.0* 64.3* 67.4*  PLT 88* 127* 135* 167 179   Blood Culture    Component Value Date/Time   SDES IN/OUT CATH URINE 02/16/2020 1119    SPECREQUEST  02/16/2020 1119    NONE Performed at [facility] [address], [place], [place] [address]    CULT 20,000 COLONIES/mL ESCHERICHIA COLI (A) 02/16/2020 1119   REPTSTATUS 02/18/2020 FINAL 02/16/2020 1119    Cardiac Enzymes: No results for input(s): CKTOTAL, CKMB, CKMBINDEX, TROPONINI in the last 168 hours. CBG: Recent Labs  Lab 02/21/20 1613 02/21/20 2132 02/22/20 0545 02/22/20 0634 02/22/20 1129  GLUCAP 122* 138* 58* 115* 48*   Iron Studies:  No results for input(s): IRON, TIBC, TRANSFERRIN, FERRITIN in the last 72 hours. @lablastinr3 @ Studies/Results: DG Abd 1 View  Result Date: 02/21/2020 CLINICAL DATA:  Abdominal pain. EXAM: ABDOMEN - 1 VIEW COMPARISON:  None. FINDINGS: The bowel gas pattern is normal. No radio-opaque calculi or other significant radiographic abnormality are seen. IMPRESSION: Negative. Electronically Signed   By: Kerby Moors M.D.   On: 02/21/2020 11:28   Medications:  . amLODipine  10 mg Oral QPM  . vitamin  C  500 mg Oral Daily  . aspirin EC  81 mg Oral Daily  . carvedilol  25 mg Oral BID  . Chlorhexidine Gluconate Cloth  6 each Topical Q0600  . cholecalciferol  1,000 Units Oral Daily  . [START ON 02/25/2020] darbepoetin (ARANESP) injection - DIALYSIS  60 mcg Intravenous Q Fri-HD  . heparin injection (subcutaneous)  7,500 Units Subcutaneous Q8H  . hydrALAZINE  100 mg Oral TID  . insulin aspart  0-5 Units Subcutaneous QHS  . insulin aspart  0-6 Units Subcutaneous TID WC  . insulin detemir  14 Units Subcutaneous Q2200  . predniSONE  50 mg Oral Daily  . zinc sulfate  220 mg Oral Daily   Assessment/Plan **Hypoxic respiratory failure:  COVID+, being treated per primary with remdesivir and steroids.  Ok on [place] currently weaned from 6 to 3. Does have underlying COPD as well.  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  Failed fluid  challenge.  Initiated HD - AVF infiltrated 1/26, s/p TDC placement 1/27 with HD #1 1/28.  Treatment #2 1/31.   AVF will need to be addressed at a future date when she's out of COVID isolation.  CLIP to outpt unit.  stop diuretics.  Next HD will be due 2/2.  I am hoping this MS issue is just part of her acute illness.  If not, I am concerned about her suitability for OP dialysis   **AMS:  Suspect multifactorial as greatly improved even prior to dialysis but uremia likely playing a role.  **DM: per primary, A1c 7.2  **LLE edema: DVT ruled out  **Anemia:  Stable in the 8s.  Iron replete.  No obvious bleeding.   ESA with HD QFriday.  Bones-  Last PTH 175 on 12/30-  Last phos 5.1 no meds as of yet    [name]  02/22/2020, 12:12 PM  [facility] Pager: [phone]

## 2020-02-22 NOTE — Progress Notes (Signed)
Hypoglycemic Event  CBG: 58 @0545   Treatment:   50% dextrose injection Symptoms: none voiced by patient  Follow-up CBG: [id number]:[id number] CBG Result:115  Possible Reasons for Event: inadequate meal intake  Comments/MD notified:    Renee Rival

## 2020-02-22 NOTE — Progress Notes (Signed)
Renal Navigator awaiting medical readiness (noted need for use of Haldol overnight for combativeness) and disposition (SNF recommended) before outpatient referral will be made. Navigator asks that [facility] update Navigator as disposition plans are made.  Renal Navigator will continue to follow.  Alphonzo Cruise, [place] Renal Navigator  [phone]

## 2020-02-22 NOTE — Progress Notes (Signed)
HOSPITAL MEDICINE OVERNIGHT EVENT NOTE    Notified by nursing the patient has become increasingly combative throughout the evening and extremely agitated.  Patient is attempting to get out of bed, pulling at tubes and lines and I struck several staff.  Chart reviewed, patient has a known history of dementia.  According to nursing patient is moving all 4 extremities spontaneously.  Patient does not have a Foley catheter in.  Patient does not seem to be in pain.  Patient currently has no significant fever.  We will attempt a trial dose of 2.5 mg of IV Haldol and monitor for response.  Vernelle Emerald  MD [facility]

## 2020-02-22 NOTE — Progress Notes (Signed)
Inpatient Diabetes Program Recommendations  AACE/ADA: New Consensus Statement on Inpatient Glycemic Control (2015)  Target Ranges:  Prepandial:   less than 140 mg/dL      Peak postprandial:   less than 180 mg/dL (1-2 hours)      Critically ill patients:  140 - 180 mg/dL   Lab Results  Component Value Date   GLUCAP 115 (H) 02/22/2020   HGBA1C 7.4 (H) 02/16/2020    Review of Glycemic Control Results for Joann Gutierrez, Joann Gutierrez (MRN [id number]) as of 02/22/2020 10:39  Ref. Range 02/21/2020 06:36 02/21/2020 11:55 02/21/2020 16:13 02/21/2020 21:32 02/22/2020 05:45 02/22/2020 06:34  Glucose-Capillary Latest Ref Range: 70 - 99 mg/dL 179 (H) 155 (H) 122 (H) 138 (H) 58 (L) 115 (H)   Diabetes history: Type 2 DM Outpatient Diabetes medications: Levemir 30 units QHS, Novolog 5 units TID Current orders for Inpatient glycemic control: Levemir 18 units QHS, Novolog 5 units TID, Novolog 0-6 units TID, Novolog 0-5 units QHS Prednisone 50 mg QD  Inpatient Diabetes Program Recommendations:    Pt has not been getting meal coverage the past 24 hours.  Hypoglycemia this am on steroid taper.  -  Reduce Lantus to 14 units -  d/c Novolog meal coverage.  Thanks,  Tama Headings RN, MSN, BC-ADM Inpatient Diabetes Coordinator Team Pager [phone] (8a-5p)

## 2020-02-22 NOTE — Progress Notes (Signed)
PT Cancellation Note  Patient Details Name: Joann Gutierrez MRN: [id number] DOB: Jan 18, 1946   Cancelled Treatment:    Reason Eval/Treat Not Completed: (P) Patient not medically ready (RN defer, pt with low blood sugar.) Will continue efforts later in day per PT POC as schedule permits.   Kara Pacer Valaree Fresquez 02/22/2020, 12:41 PM

## 2020-02-22 NOTE — Progress Notes (Signed)
PROGRESS NOTE    Joann Gutierrez  KXF:[id number] DOB: January 27, 1945 DOA: 02/15/2020 PCP: Andree Moro, DO    Brief Narrative:  75 y.o. 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 When EMS arrived she was satting 67-70% on her home CPAP machine which seemed to have a poor seal. On EMS CPAP, sats improved to 93-95%. Blood pressure elevated at 180/100 ED Course: Patient initially required BiPAP but later satting well on 5 L oxygen via nasal cannula. Febrile with temperature 101.8 F. Slightly tachycardic on arrival. Tachypneic. Not hypotensive. WBC 4.9, hemoglobin 10.4 (at baseline), platelet count 76K. Sodium 141, potassium 5.0, chloride 110, bicarb 16, anion gap 15, BUN 81, creatinine 6.0 (was 3.9 on 01/20/2020), glucose 271. High-sensitivity troponin 56 >56. EKG not suggestive of ACS. INR 1.2. Magnesium 1.7. BNP normal. SARS-CoV-2 PCR test positive. Lactic acid 1.5. Blood culture x2 pending. LFTs normal. VBG with pH 7.28. Lactic acid 1.2. Procalcitonin 1.63. Inflammatory markers elevated: D-dimer 2.95, LDH 315, ferritin 919, fibrinogen 592, CRP 17.3.  Chest x-ray showing findings consistent with developing infiltrate predominantly involving the right lung.  Assessment & Plan:   Principal Problem:   Pneumonia due to COVID-19 virus Active Problems:   AKI (acute kidney injury) ([place])   Sepsis ([place])   Acute on chronic respiratory failure with hypoxia (H[facility])   Edema of left lower leg    Sepsis and acute on chronic hypoxemic respiratory failure secondary to COVID-19 viral pneumonia: Meets criteria for sepsis-2 SIRS (fever, tachycardia, tachypnea) and  SARS-CoV-2 PCR test positive. No hypotension or lactic acidosis to suggest severe sepsis. Chest x-ray showing findings consistent with developing infiltrate predominantly involving the right lung. Inflammatory markers at presentation were elevated: D-dimer 2.95, LDH 315, ferritin 919, fibrinogen 592, CRP 17.3. Initially required BiPAP but now satting well on 3 L supplemental oxygen. -Had completed course of Remdesivir and steroids -Given elevated procalcitonin of 1.63 at presentation, continued on antibiotics for coverage of bacterial pneumonia as well. -CRP peaked over 19, down to 4.4 as of 1/31 -Supplemental oxygen as needed to keep oxygen saturation above 90% -Cont to follow inflammatory markers  AKI on CKD stage V: Patient has an AV graft in place. BUN 81, creatinine 6.0 (was 3.9 on 01/20/2020). Likely prerenal from dehydration in the setting of acute viral illness in the setting of home diuretic and ARB.  -Lasix, spironolactone, and losartan on hold.  -Nephrology consulted, now s/p [facility] cath. -per Nephrology following, continue HD per Nephrology. Plan to arrange outpt HD chair  Metabolic acidosis: Bicarb 16, anion gap 15. Likely due to AKI on CKD stage V. VBG with pH within normal range. -Cont HD per Nephrology, per above  Left lower extremity edema/concern for DVT: She has unilateral left lower extremity edema and D-dimer elevated in the setting of Covid infection. -Left lower extremity Doppler reviewed, neg for DVT -Continued on prophylactic heparin as per above  Acute encephalopathy:   -Stat head CT noted to be neg , stat ABG reviewed, unremarkable -Conversing appropriately, overall improved  Elevated troponin: High-sensitivity troponin slightly elevated but stable (56 > 56). EKG without acute ischemic changes. Troponin elevation likely due to demand ischemia in setting of Covid pneumonia. -continue Cardiac monitoring  Insulin-dependent diabetes mellitus -a1c  7.4. Sliding  scale insulin very sensitive ACHS.  Thrombocytopenia: Platelet count currently 76K, was 121K on 01/20/2020 but there are no other prior labs available for comparison. Thrombocytopenia likely related to chronic renal disease and now worse in the setting of acute viral illness. -normalized -recheck cbc in AM  COPD: Stable. No wheezing. -cont with bronchodilators as needed  DVT prophylaxis: Heparin subq Code Status: Full Family Communication: Pt in room, family not at bedside  Status is: Inpatient  Remains inpatient appropriate because:Unsafe d/c plan and Inpatient level of care appropriate due to severity of illness   Dispo: The patient is from: Home              Anticipated d/c is to: Home              Anticipated d/c date is: > 3 days              Patient currently is not medically stable to d/c.   Difficult to place patient No  Consultants:   Nephrology  IR  Procedures:     Antimicrobials: Anti-infectives (From admission, onward)   Start     Dose/Rate Route Frequency Ordered Stop   02/17/20 0600  ceFAZolin (ANCEF) IVPB 2g/100 mL premix        2 g 200 mL/hr over 30 Minutes Intravenous On call 02/16/20 1635 02/17/20 1810   02/16/20 1000  remdesivir 100 mg in sodium chloride 0.9 % 100 mL IVPB       "Followed by" Linked Group Details   100 mg 200 mL/hr over 30 Minutes Intravenous Daily 02/15/20 2256 02/19/20 1045   02/16/20 0000  remdesivir 200 mg in sodium chloride 0.9% 250 mL IVPB       "Followed by" Linked Group Details   200 mg 580 mL/hr over 30 Minutes Intravenous Once 02/15/20 2256 02/16/20 0239   02/15/20 2115  cefTRIAXone (ROCEPHIN) 2 g in sodium chloride 0.9 % 100 mL IVPB        2 g 200 mL/hr over 30 Minutes Intravenous Every 24 hours 02/15/20 2104 02/21/20 2324   02/15/20 2115  azithromycin (ZITHROMAX) 500 mg in sodium chloride 0.9 % 250 mL IVPB        500 mg 250 mL/hr over 60 Minutes Intravenous Every 24 hours 02/15/20 2104 02/21/20 2238       Subjective: Without complaints at this time  Objective: Vitals:   02/22/20 0552 02/22/20 0911 02/22/20 1127 02/22/20 1604  BP:  (!) 165/77 (!) 112/98 (!) 149/71  Pulse:  80 72 74  Resp:  20 (!) 21 20  Temp:  98.2 F (36.8 C) 98.4 F (36.9 C) 98.1 F (36.7 C)  TempSrc:  Oral Oral Oral  SpO2:  98% 95% 96%  Weight: 113.3 kg   114.3 kg  Height:    5\' 7"  (1.702 m)    Intake/Output Summary (Last 24 hours) at 02/22/2020 1657 Last data filed at 02/21/2020 2356 Gross per 24 hour  Intake 470 ml  Output -  Net 470 ml   Filed Weights   02/21/20 1555 02/22/20 0552 02/22/20 1604  Weight: 117.1 kg 113.3 kg 114.3 kg    Examination: General exam: Awake, laying in bed, in nad Respiratory system: Normal respiratory effort, no wheezing Cardiovascular system: regular rate, s1, s2 Gastrointestinal system: Soft, nondistended, positive BS Central nervous system: CN2-12 grossly intact, strength intact Extremities: Perfused, no clubbing Skin: Normal skin turgor, no notable skin lesions seen Psychiatry: Mood normal // no visual hallucinations  Data Reviewed: I have personally reviewed following labs and imaging studies  CBC: Recent Labs  Lab 02/17/20 0219 02/18/20 0330 02/19/20 0104 02/20/20 0315 02/21/20 0124  WBC 4.2 6.4 5.8 6.8 10.4  NEUTROABS 2.6 4.5 4.0 5.3 5.9  HGB 8.5* 8.7* 8.3* 8.4* 7.8*  HCT 28.1* 28.6* 26.2* 26.1* 25.9*  MCV 68.2* 67.3* 65.0* 64.3* 67.4*  PLT 88* 127* 135* 167 099   Basic Metabolic Panel: Recent Labs  Lab 02/15/20 2043 02/15/20 2139 02/16/20 1815 02/17/20 0219 02/18/20 0330 02/19/20 0104 02/20/20 0315 02/21/20 0124  NA 141   < > 144 143 142 138 139 140  K 5.0   < > 4.8 4.5 4.8 3.8 4.0 4.6  CL 110  --  109 111 109 104 104 105  CO2 16*  --  21* 18* 16* 20* 20* 19*  GLUCOSE 271*  --  283* 344* 309* 221* 134* 203*  BUN 81*  --  83* 92* 115* 77* 91* 111*  CREATININE 6.05*  --  5.83* 6.24* 7.83* 6.34* 8.20* 9.72*  CALCIUM 7.5*  --  7.6* 7.0*  7.5* 7.5* 7.7* 7.6*  MG 1.7  --   --   --   --   --   --   --   PHOS  --   --  5.1*  --   --   --   --   --    < > = values in this interval not displayed.   GFR: Estimated Creatinine Clearance: 6.6 mL/min (A) (by C-G formula based on SCr of 9.72 mg/dL (H)). Liver Function Tests: Recent Labs  Lab 02/17/20 0219 02/18/20 0330 02/19/20 0104 02/20/20 0315 02/21/20 0124  AST 26 27 32 20 24  ALT 13 9 10 8 5   ALKPHOS 46 48 69 59 62  BILITOT 0.5 0.8 0.4 0.6 0.3  PROT 6.3* 6.3* 6.2* 6.2* 6.1*  ALBUMIN 2.3* 2.3* 2.3* 2.4* 2.4*   No results for input(s): LIPASE, AMYLASE in the last 168 hours. Recent Labs  Lab 02/16/20 0505  AMMONIA 21   Coagulation Profile: Recent Labs  Lab 02/15/20 2043  INR 1.2   Cardiac Enzymes: No results for input(s): CKTOTAL, CKMB, CKMBINDEX, TROPONINI in the last 168 hours. BNP (last 3 results) No results for input(s): PROBNP in the last 8760 hours. HbA1C: No results for input(s): HGBA1C in the last 72 hours. CBG: Recent Labs  Lab 02/22/20 0634 02/22/20 1129 02/22/20 1218 02/22/20 1437 02/22/20 1508  GLUCAP 115* 48* 77 69* 135*   Lipid Profile: No results for input(s): CHOL, HDL, LDLCALC, TRIG, CHOLHDL, LDLDIRECT in the last 72 hours. Thyroid Function Tests: No results for input(s): TSH, T4TOTAL, FREET4, T3FREE, THYROIDAB in the last 72 hours. Anemia Panel: No results for input(s): VITAMINB12, FOLATE, FERRITIN, TIBC, IRON, RETICCTPCT in the last 72 hours. Sepsis Labs: Recent Labs  Lab 02/15/20 2104 02/15/20 2235 02/16/20 0053 02/17/20 0219 02/18/20 0330 02/19/20 0104 02/20/20 0315  PROCALCITON  --   --    < > 2.17 2.31 1.90 1.43  LATICACIDVEN 1.5 1.2  --   --   --   --   --    < > = values in this interval not displayed.    Recent Results (from the past 240 hour(s))  SARS Coronavirus 2 by RT PCR (hospital order, performed in [facility] hospital lab) Nasopharyngeal Nasopharyngeal Swab     Status: Abnormal   Collection Time:  02/15/20  8:44 PM   Specimen: Nasopharyngeal Swab  Result Value Ref  Range Status   SARS Coronavirus 2 POSITIVE (A) NEGATIVE Final    Comment: CRITICAL RESULT CALLED TO, READ BACK BY AND VERIFIED WITH: DR Aldona Bar P. [phone] FCP (NOTE) SARS-CoV-2 target nucleic acids are DETECTED  SARS-CoV-2 RNA is generally detectable in upper respiratory specimens  during the acute phase of infection.  Positive results are indicative  of the presence of the identified virus, but do not rule out bacterial infection or co-infection with other pathogens not detected by the test.  Clinical correlation with patient history and  other diagnostic information is necessary to determine patient infection status.  The expected result is negative.  Fact Sheet for Patients:   StrictlyIdeas.no   Fact Sheet for Healthcare Providers:   BankingDealers.co.za    This test is not yet approved or cleared by the [place] FDA and  has been authorized for detection and/or diagnosis of SARS-CoV-2 by FDA under an Emergency Use Authorization (EUA).  This EUA will remain in effect (meaning th is test can be used) for the duration of  the COVID-19 declaration under Section 564(b)(1) of the Act, 21 U.S.C. section 360-bbb-3(b)(1), unless the authorization is terminated or revoked sooner.  Performed at [facility], [place] [address], [place], [place] [address]   Blood Culture (routine x 2)     Status: Abnormal   Collection Time: 02/15/20  9:04 PM   Specimen: BLOOD  Result Value Ref Range Status   Specimen Description BLOOD SITE NOT SPECIFIED  Final   Special Requests   Final    BOTTLES DRAWN AEROBIC AND ANAEROBIC Blood Culture adequate volume   Culture  Setup Time   Final    GRAM POSITIVE COCCI IN CHAINS AEROBIC BOTTLE ONLY Organism ID to follow CRITICAL RESULT CALLED TO, READ BACK BY AND VERIFIED WITH: A. Rogers Blocker PharmD 12:15 02/18/20 ([email or web address])    Culture (A)   Final    ALLOIOCOCCUS OTITIS Standardized susceptibility testing for this organism is not available. Performed at [facility], [place] [address] [place], [place] [address]    Report Status 02/22/2020 FINAL  Final  Blood Culture ID Panel (Reflexed)     Status: None   Collection Time: 02/15/20  9:04 PM  Result Value Ref Range Status   Enterococcus faecalis NOT DETECTED NOT DETECTED Final   Enterococcus Faecium NOT DETECTED NOT DETECTED Final   Listeria monocytogenes NOT DETECTED NOT DETECTED Final   Staphylococcus species NOT DETECTED NOT DETECTED Final   Staphylococcus aureus (BCID) NOT DETECTED NOT DETECTED Final   Staphylococcus epidermidis NOT DETECTED NOT DETECTED Final   Staphylococcus lugdunensis NOT DETECTED NOT DETECTED Final   Streptococcus species NOT DETECTED NOT DETECTED Final   Streptococcus agalactiae NOT DETECTED NOT DETECTED Final   Streptococcus pneumoniae NOT DETECTED NOT DETECTED Final   Streptococcus pyogenes NOT DETECTED NOT DETECTED Final   A.calcoaceticus-baumannii NOT DETECTED NOT DETECTED Final   Bacteroides fragilis NOT DETECTED NOT DETECTED Final   Enterobacterales NOT DETECTED NOT DETECTED Final   Enterobacter cloacae complex NOT DETECTED NOT DETECTED Final   Escherichia coli NOT DETECTED NOT DETECTED Final   Klebsiella aerogenes NOT DETECTED NOT DETECTED Final   Klebsiella oxytoca NOT DETECTED NOT DETECTED Final   Klebsiella pneumoniae NOT DETECTED NOT DETECTED Final   Proteus species NOT DETECTED NOT DETECTED Final   Salmonella species NOT DETECTED NOT DETECTED Final   Serratia marcescens NOT DETECTED NOT DETECTED Final   Haemophilus influenzae NOT DETECTED NOT DETECTED Final   Neisseria meningitidis NOT DETECTED NOT  DETECTED Final   Pseudomonas aeruginosa NOT DETECTED NOT DETECTED Final   Stenotrophomonas maltophilia NOT DETECTED NOT DETECTED Final   Candida albicans NOT DETECTED NOT DETECTED Final   Candida auris NOT DETECTED NOT DETECTED Final    Candida glabrata NOT DETECTED NOT DETECTED Final   Candida krusei NOT DETECTED NOT DETECTED Final   Candida parapsilosis NOT DETECTED NOT DETECTED Final   Candida tropicalis NOT DETECTED NOT DETECTED Final   Cryptococcus neoformans/gattii NOT DETECTED NOT DETECTED Final    Comment: Performed at [facility], [place] [address], [place], [place] [address]  Urine culture     Status: Abnormal   Collection Time: 02/16/20 11:19 AM   Specimen: In/Out Cath Urine  Result Value Ref Range Status   Specimen Description IN/OUT CATH URINE  Final   Special Requests   Final    NONE Performed at [facility], [place] [address], [place], [place] [address]    Culture 20,000 COLONIES/mL ESCHERICHIA COLI (A)  Final   Report Status 02/18/2020 FINAL  Final   Organism ID, Bacteria ESCHERICHIA COLI (A)  Final      Susceptibility   Escherichia coli - MIC*    AMPICILLIN >=32 RESISTANT Resistant     CEFAZOLIN <=4 SENSITIVE Sensitive     CEFEPIME <=0.12 SENSITIVE Sensitive     CEFTRIAXONE <=0.25 SENSITIVE Sensitive     CIPROFLOXACIN <=0.25 SENSITIVE Sensitive     GENTAMICIN >=16 RESISTANT Resistant     IMIPENEM 0.5 SENSITIVE Sensitive     NITROFURANTOIN <=16 SENSITIVE Sensitive     TRIMETH/SULFA >=320 RESISTANT Resistant     AMPICILLIN/SULBACTAM 16 INTERMEDIATE Intermediate     PIP/TAZO <=4 SENSITIVE Sensitive     * 20,000 COLONIES/mL ESCHERICHIA COLI     Radiology Studies: DG Abd 1 View  Result Date: 02/21/2020 CLINICAL DATA:  Abdominal pain. EXAM: ABDOMEN - 1 VIEW COMPARISON:  None. FINDINGS: The bowel gas pattern is normal. No radio-opaque calculi or other significant radiographic abnormality are seen. IMPRESSION: Negative. Electronically Signed   By: Kerby Moors M.D.   On: 02/21/2020 11:28    Scheduled Meds: . amLODipine  10 mg Oral QPM  . vitamin C  500 mg Oral Daily  . aspirin EC  81 mg Oral Daily  . carvedilol  25 mg Oral BID  . Chlorhexidine Gluconate Cloth  6 each Topical Q0600   . [START ON 02/23/2020] Chlorhexidine Gluconate Cloth  6 each Topical Q0600  . cholecalciferol  1,000 Units Oral Daily  . [START ON 02/25/2020] darbepoetin (ARANESP) injection - DIALYSIS  150 mcg Intravenous Q Fri-HD  . heparin injection (subcutaneous)  7,500 Units Subcutaneous Q8H  . hydrALAZINE  100 mg Oral TID  . insulin aspart  0-5 Units Subcutaneous QHS  . insulin aspart  0-6 Units Subcutaneous TID WC  . insulin detemir  14 Units Subcutaneous Q2200  . predniSONE  50 mg Oral Daily  . zinc sulfate  220 mg Oral Daily   Continuous Infusions:    LOS: 7 days   Marylu Lund, MD [facility] Pager On Amion  If 7PM-7AM, please contact night-coverage 02/22/2020, 4:57 PM

## 2020-02-23 DIAGNOSIS — U071 COVID-19: Secondary | ICD-10-CM | POA: Diagnosis not present

## 2020-02-23 DIAGNOSIS — J1282 Pneumonia due to coronavirus disease 2019: Secondary | ICD-10-CM | POA: Diagnosis not present

## 2020-02-23 LAB — COMPREHENSIVE METABOLIC PANEL
ALT: 7 U/L (ref 0–44)
AST: 26 U/L (ref 15–41)
Albumin: 2.4 g/dL — ABNORMAL LOW (ref 3.5–5.0)
Alkaline Phosphatase: 69 U/L (ref 38–126)
Anion gap: 14 (ref 5–15)
BUN: 72 mg/dL — ABNORMAL HIGH (ref 8–23)
CO2: 22 mmol/L (ref 22–32)
Calcium: 7.5 mg/dL — ABNORMAL LOW (ref 8.9–10.3)
Chloride: 100 mmol/L (ref 98–111)
Creatinine, Ser: 8.89 mg/dL — ABNORMAL HIGH (ref 0.44–1.00)
GFR, Estimated: 4 mL/min — ABNORMAL LOW (ref >60)
Glucose, Bld: 308 mg/dL — ABNORMAL HIGH (ref 70–99)
Potassium: 3.9 mmol/L (ref 3.5–5.1)
Sodium: 136 mmol/L (ref 135–145)
Total Bilirubin: 0.7 mg/dL (ref 0.3–1.2)
Total Protein: 6.1 g/dL — ABNORMAL LOW (ref 6.5–8.1)

## 2020-02-23 LAB — CBC
HCT: 24.5 % — ABNORMAL LOW (ref 36.0–46.0)
Hemoglobin: 7.8 g/dL — ABNORMAL LOW (ref 12.0–15.0)
MCH: 21 pg — ABNORMAL LOW (ref 26.0–34.0)
MCHC: 31.8 g/dL (ref 30.0–36.0)
MCV: 66 fL — ABNORMAL LOW (ref 80.0–100.0)
Platelets: 216 10*3/uL (ref 150–400)
RBC: 3.71 MIL/uL — ABNORMAL LOW (ref 3.87–5.11)
RDW: 21 % — ABNORMAL HIGH (ref 11.5–15.5)
WBC: 12.5 10*3/uL — ABNORMAL HIGH (ref 4.0–10.5)
nRBC: 0.6 % — ABNORMAL HIGH (ref 0.0–0.2)

## 2020-02-23 LAB — GLUCOSE, CAPILLARY
Glucose-Capillary: 207 mg/dL — ABNORMAL HIGH (ref 70–99)
Glucose-Capillary: 103 mg/dL — ABNORMAL HIGH (ref 70–99)
Glucose-Capillary: 80 mg/dL (ref 70–99)
Glucose-Capillary: 134 mg/dL — ABNORMAL HIGH (ref 70–99)

## 2020-02-23 MED ORDER — HEPARIN SODIUM (PORCINE) 1000 UNIT/ML IJ SOLN
INTRAMUSCULAR | Status: AC
  Administered 2020-02-23: 3600 [IU]
  Filled 2020-02-23: qty 4

## 2020-02-23 NOTE — Progress Notes (Signed)
[facility] Progress Note   Subjective:  No UOP recorded.    No behavioral issues overnight- due for HD today   Objective Vitals:   02/23/20 0057 02/23/20 0400 02/23/20 1320 02/23/20 1330  BP:  (!) 147/62 (!) 165/82 (!) 168/76  Pulse: 75 72 75 89  Resp: (!) 22 (!) 22    Temp:  98.6 F (37 C)    TempSrc:  Axillary    SpO2: 99% 94%    Weight:      Height:       Physical Exam-  Not examined 2/2-  This exam is from yest-  In review of other notes ms seems improved General: alert, says she is miserable-  Did not recall automatically that she has been getting dialysis  Heart: RRR Lungs: clearer ant, on 3L, normal WOB Abdomen: soft Extremities: mild edema Dialysis Access:  RUE AVF + infiltration venous; +t/b present, [facility] c/d/i   Additional Objective Labs: Basic Metabolic Panel: Recent Labs  Lab 02/16/20 1815 02/17/20 0219 02/20/20 0315 02/21/20 0124 02/23/20 0234  NA 144   < > 139 140 136  K 4.8   < > 4.0 4.6 3.9  CL 109   < > 104 105 100  CO2 21*   < > 20* 19* 22  GLUCOSE 283*   < > 134* 203* 308*  BUN 83*   < > 91* 111* 72*  CREATININE 5.83*   < > 8.20* 9.72* 8.89*  CALCIUM 7.6*   < > 7.7* 7.6* 7.5*  PHOS 5.1*  --   --   --   --    < > = values in this interval not displayed.   Liver Function Tests: Recent Labs  Lab 02/20/20 0315 02/21/20 0124 02/23/20 0234  AST 20 24 26   ALT 8 5 7   ALKPHOS 59 62 69  BILITOT 0.6 0.3 0.7  PROT 6.2* 6.1* 6.1*  ALBUMIN 2.4* 2.4* 2.4*   No results for input(s): LIPASE, AMYLASE in the last 168 hours. CBC: Recent Labs  Lab 02/18/20 0330 02/19/20 0104 02/20/20 0315 02/21/20 0124 02/23/20 0234  WBC 6.4 5.8 6.8 10.4 12.5*  NEUTROABS 4.5 4.0 5.3 5.9  --   HGB 8.7* 8.3* 8.4* 7.8* 7.8*  HCT 28.6* 26.2* 26.1* 25.9* 24.5*  MCV 67.3* 65.0* 64.3* 67.4* 66.0*  PLT 127* 135* 167 179 216   Blood Culture    Component Value Date/Time   SDES IN/OUT CATH URINE 02/16/2020 1119   SPECREQUEST  02/16/2020 1119     NONE Performed at [facility] [address] [place], [place] [address]    CULT 20,000 COLONIES/mL ESCHERICHIA COLI (A) 02/16/2020 1119   REPTSTATUS 02/18/2020 FINAL 02/16/2020 1119    Cardiac Enzymes: No results for input(s): CKTOTAL, CKMB, CKMBINDEX, TROPONINI in the last 168 hours. CBG: Recent Labs  Lab 02/22/20 1508 02/22/20 1727 02/22/20 2042 02/23/20 0739 02/23/20 1214  GLUCAP 135* 127* 253* 207* 103*   Iron Studies:  No results for input(s): IRON, TIBC, TRANSFERRIN, FERRITIN in the last 72 hours. @lablastinr3 @ Studies/Results: No results found. Medications:  . amLODipine  10 mg Oral QPM  . vitamin C  500 mg Oral Daily  . aspirin EC  81 mg Oral Daily  . carvedilol  25 mg Oral BID  . Chlorhexidine Gluconate Cloth  6 each Topical Q0600  . cholecalciferol  1,000 Units Oral Daily  . [START ON 02/25/2020] darbepoetin (ARANESP) injection - DIALYSIS  150 mcg Intravenous Q Fri-HD  .  heparin injection (subcutaneous)  7,500 Units Subcutaneous Q8H  . hydrALAZINE  100 mg Oral TID  . insulin aspart  0-5 Units Subcutaneous QHS  . insulin aspart  0-6 Units Subcutaneous TID WC  . insulin detemir  14 Units Subcutaneous Q2200  . predniSONE  50 mg Oral Daily  . zinc sulfate  220 mg Oral Daily   Assessment/Plan **Hypoxic respiratory failure:  COVID+, being treated per primary with remdesivir and steroids.  Ok on [place] currently weaned from 6 to 3. Does have underlying COPD as well.  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  Failed fluid challenge.  Initiated HD - AVF infiltrated 1/26, s/p TDC placement 1/27 with HD #1 1/28.  Treatment #2 1/31, #3 today.   AVF will need to be addressed at a future date when she's out of COVID isolation.  CLIP to outpt unit.  stop diuretics.  Next HD will be due 2/4.  I am hoping this MS issue is just part of her acute illness.  If not, I am  concerned about her suitability for OP dialysis   **AMS:  Suspect multifactorial as greatly improved even prior to dialysis but uremia likely playing a role.  **DM: per primary, A1c 7.2  **LLE edema: DVT ruled out  **Anemia:  Stable in the 8s.  Iron replete.  No obvious bleeding.   ESA with HD QFriday.  Bones-  Last PTH 175 on 12/30-  Last phos 5.1 no meds as of yet    [name]  02/23/2020, 1:37 PM  [facility] Pager: [phone]

## 2020-02-23 NOTE — Progress Notes (Signed)
PROGRESS NOTE  Joann Gutierrez GNF:[id number] DOB: 1945/06/26 DOA: 02/15/2020 PCP: Andree Moro, DO   LOS: 8 days   Brief Narrative / Interim history: 75 year old female with dementia, asthma, COPD, chronic hypoxia with 2 L home oxygen, CAD, chronic kidney disease stage V with AV graft in place, diabetes mellitus, hypertension, hyperlipidemia, obesity came into the hospital with shortness of breath.  On arrival she was satting about 70%.  She initially required BiPAP and was transitioned to 5 L.  She was febrile to 101.8, and Covid was found to be positive  Subjective / 24h Interval events: She is doing well this morning, no significant complaints  Assessment & Plan: Principal Problem Acute on chronic hypoxic respiratory failure due to COVID-19 viral pneumonia-status post Remdesivir, on steroids -Respiratory status stable currently on 3 L nasal cannula -There was concern for DVT but Doppler was negative  COVID-19 Labs  Recent Labs    02/21/20 0124  DDIMER 1.89*  CRP 4.4*    Lab Results  Component Value Date   SARSCOV2NAA POSITIVE (A) 02/15/2020   SARSCOV2NAA NOT DETECTED 11/12/2018   [place] NEGATIVE 07/31/2018   Active Problems Acute kidney injury on chronic kidney disease stage V, metabolic acidosis-nephrology consulted, had a temporary dialysis cath placed and she is now dialysis dependent with plans to arrange outpatient hemodialysis  Acute metabolic encephalopathy -Appears improved, due to Covid  Insulin-dependent diabetes mellitus -A1c 7.4, continue sliding scale  Thrombocytopenia -Due to chronic renal disease, COVID-19.  Now normalized.  COPD -Stable, no wheezing  Scheduled Meds: . amLODipine  10 mg Oral QPM  . vitamin C  500 mg Oral Daily  . aspirin EC  81 mg Oral Daily  . carvedilol  25 mg Oral BID  . Chlorhexidine Gluconate Cloth  6 each Topical Q0600  . cholecalciferol  1,000 Units Oral Daily  . [START ON 02/25/2020] darbepoetin (ARANESP)  injection - DIALYSIS  150 mcg Intravenous Q Fri-HD  . heparin injection (subcutaneous)  7,500 Units Subcutaneous Q8H  . hydrALAZINE  100 mg Oral TID  . insulin aspart  0-5 Units Subcutaneous QHS  . insulin aspart  0-6 Units Subcutaneous TID WC  . insulin detemir  14 Units Subcutaneous Q2200  . predniSONE  50 mg Oral Daily  . zinc sulfate  220 mg Oral Daily   Continuous Infusions: PRN Meds:.acetaminophen, guaiFENesin-dextromethorphan, Ipratropium-Albuterol, lip balm, ondansetron (ZOFRAN) IV  Diet Orders (From admission, onward)    Start     Ordered   02/20/20 0802  Diet renal/carb modified with fluid restriction Diet-HS Snack? Nothing; Fluid restriction: 1200 mL Fluid; Room service appropriate? No; Fluid consistency: Thin  Diet effective now       Question Answer Comment  Diet-HS Snack? Nothing   Fluid restriction: 1200 mL Fluid   Room service appropriate? No   Fluid consistency: Thin      02/20/20 0801          DVT prophylaxis:      Code Status: Full Code  Family Communication: no family at bedside   Status is: Inpatient  Remains inpatient appropriate because:Inpatient level of care appropriate due to severity of illness   Dispo: The patient is from: Home              Anticipated d/c is to: SNF              Anticipated d/c date is: 2 days              Patient currently is not medically  stable to d/c.   Difficult to place patient No  Level of care: Telemetry Medical  Consultants:  Nephrology  IR  Procedures:  none  Microbiology  none  Antimicrobials: none    Objective: Vitals:   02/22/20 2000 02/22/20 2217 02/23/20 0057 02/23/20 0400  BP: (!) 142/56 (!) 152/65  (!) 147/62  Pulse: 83  75 72  Resp: 18  (!) 22 (!) 22  Temp: 98 F (36.7 C)   98.6 F (37 C)  TempSrc:    Axillary  SpO2: 93%  99% 94%  Weight:      Height:        Intake/Output Summary (Last 24 hours) at 02/23/2020 1058 Last data filed at 02/23/2020 0915 Gross per 24 hour  Intake 240  ml  Output --  Net 240 ml   Filed Weights   02/21/20 1555 02/22/20 0552 02/22/20 1604  Weight: 117.1 kg 113.3 kg 114.3 kg    Examination:  Constitutional: NAD Eyes: no scleral icterus ENMT: Mucous membranes are moist.  Neck: normal, supple Respiratory: clear to auscultation bilaterally, no wheezing, no crackles.  Cardiovascular: Regular rate and rhythm, no murmurs / rubs / gallops. Trace LE edema.  Abdomen: non distended, no tenderness. Bowel sounds positive.  Musculoskeletal: no clubbing / cyanosis.  Skin: no rashes Neurologic: CN 2-12 grossly intact. Strength 5/5 in all 4.   Data Reviewed: I have independently reviewed following labs and imaging studies   CBC: Recent Labs  Lab 02/17/20 0219 02/18/20 0330 02/19/20 0104 02/20/20 0315 02/21/20 0124 02/23/20 0234  WBC 4.2 6.4 5.8 6.8 10.4 12.5*  NEUTROABS 2.6 4.5 4.0 5.3 5.9  --   HGB 8.5* 8.7* 8.3* 8.4* 7.8* 7.8*  HCT 28.1* 28.6* 26.2* 26.1* 25.9* 24.5*  MCV 68.2* 67.3* 65.0* 64.3* 67.4* 66.0*  PLT 88* 127* 135* 167 179 846   Basic Metabolic Panel: Recent Labs  Lab 02/16/20 1815 02/17/20 0219 02/18/20 0330 02/19/20 0104 02/20/20 0315 02/21/20 0124 02/23/20 0234  NA 144   < > 142 138 139 140 136  K 4.8   < > 4.8 3.8 4.0 4.6 3.9  CL 109   < > 109 104 104 105 100  CO2 21*   < > 16* 20* 20* 19* 22  GLUCOSE 283*   < > 309* 221* 134* 203* 308*  BUN 83*   < > 115* 77* 91* 111* 72*  CREATININE 5.83*   < > 7.83* 6.34* 8.20* 9.72* 8.89*  CALCIUM 7.6*   < > 7.5* 7.5* 7.7* 7.6* 7.5*  PHOS 5.1*  --   --   --   --   --   --    < > = values in this interval not displayed.   Liver Function Tests: Recent Labs  Lab 02/18/20 0330 02/19/20 0104 02/20/20 0315 02/21/20 0124 02/23/20 0234  AST 27 32 20 24 26   ALT 9 10 8 5 7   ALKPHOS 48 69 59 62 69  BILITOT 0.8 0.4 0.6 0.3 0.7  PROT 6.3* 6.2* 6.2* 6.1* 6.1*  ALBUMIN 2.3* 2.3* 2.4* 2.4* 2.4*   Coagulation Profile: No results for input(s): INR, PROTIME in the last  168 hours. HbA1C: No results for input(s): HGBA1C in the last 72 hours. CBG: Recent Labs  Lab 02/22/20 1437 02/22/20 1508 02/22/20 1727 02/22/20 2042 02/23/20 0739  GLUCAP 69* 135* 127* 253* 207*    Recent Results (from the past 240 hour(s))  SARS Coronavirus 2 by RT PCR (hospital order, performed in [facility] hospital  lab) Nasopharyngeal Nasopharyngeal Swab     Status: Abnormal   Collection Time: 02/15/20  8:44 PM   Specimen: Nasopharyngeal Swab  Result Value Ref Range Status   SARS Coronavirus 2 POSITIVE (A) NEGATIVE Final    Comment: CRITICAL RESULT CALLED TO, READ BACK BY AND VERIFIED WITH: DR Aldona Bar P. [id number] FCP (NOTE) SARS-CoV-2 target nucleic acids are DETECTED  SARS-CoV-2 RNA is generally detectable in upper respiratory specimens  during the acute phase of infection.  Positive results are indicative  of the presence of the identified virus, but do not rule out bacterial infection or co-infection with other pathogens not detected by the test.  Clinical correlation with patient history and  other diagnostic information is necessary to determine patient infection status.  The expected result is negative.  Fact Sheet for Patients:   StrictlyIdeas.no   Fact Sheet for Healthcare Providers:   BankingDealers.co.za    This test is not yet approved or cleared by the [place] FDA and  has been authorized for detection and/or diagnosis of SARS-CoV-2 by FDA under an Emergency Use Authorization (EUA).  This EUA will remain in effect (meaning th is test can be used) for the duration of  the COVID-19 declaration under Section 564(b)(1) of the Act, 21 U.S.C. section 360-bbb-3(b)(1), unless the authorization is terminated or revoked sooner.  Performed at [facility], [place] [address], [place], [place] [address]   Blood Culture (routine x 2)     Status: Abnormal   Collection Time: 02/15/20  9:04 PM   Specimen:  BLOOD  Result Value Ref Range Status   Specimen Description BLOOD SITE NOT SPECIFIED  Final   Special Requests   Final    BOTTLES DRAWN AEROBIC AND ANAEROBIC Blood Culture adequate volume   Culture  Setup Time   Final    GRAM POSITIVE COCCI IN CHAINS AEROBIC BOTTLE ONLY Organism ID to follow CRITICAL RESULT CALLED TO, READ BACK BY AND VERIFIED WITH: A. Rogers Blocker PharmD 12:15 02/18/20 ([email or web address])    Culture (A)  Final    ALLOIOCOCCUS OTITIS Standardized susceptibility testing for this organism is not available. Performed at [facility], [place] [address] [place], [place] [address]    Report Status 02/22/2020 FINAL  Final  Blood Culture ID Panel (Reflexed)     Status: None   Collection Time: 02/15/20  9:04 PM  Result Value Ref Range Status   Enterococcus faecalis NOT DETECTED NOT DETECTED Final   Enterococcus Faecium NOT DETECTED NOT DETECTED Final   Listeria monocytogenes NOT DETECTED NOT DETECTED Final   Staphylococcus species NOT DETECTED NOT DETECTED Final   Staphylococcus aureus (BCID) NOT DETECTED NOT DETECTED Final   Staphylococcus epidermidis NOT DETECTED NOT DETECTED Final   Staphylococcus lugdunensis NOT DETECTED NOT DETECTED Final   Streptococcus species NOT DETECTED NOT DETECTED Final   Streptococcus agalactiae NOT DETECTED NOT DETECTED Final   Streptococcus pneumoniae NOT DETECTED NOT DETECTED Final   Streptococcus pyogenes NOT DETECTED NOT DETECTED Final   A.calcoaceticus-baumannii NOT DETECTED NOT DETECTED Final   Bacteroides fragilis NOT DETECTED NOT DETECTED Final   Enterobacterales NOT DETECTED NOT DETECTED Final   Enterobacter cloacae complex NOT DETECTED NOT DETECTED Final   Escherichia coli NOT DETECTED NOT DETECTED Final   Klebsiella aerogenes NOT DETECTED NOT DETECTED Final   Klebsiella oxytoca NOT DETECTED NOT DETECTED Final   Klebsiella pneumoniae NOT DETECTED NOT DETECTED Final   Proteus species NOT DETECTED NOT DETECTED Final   Salmonella species NOT  DETECTED  NOT DETECTED Final   Serratia marcescens NOT DETECTED NOT DETECTED Final   Haemophilus influenzae NOT DETECTED NOT DETECTED Final   Neisseria meningitidis NOT DETECTED NOT DETECTED Final   Pseudomonas aeruginosa NOT DETECTED NOT DETECTED Final   Stenotrophomonas maltophilia NOT DETECTED NOT DETECTED Final   Candida albicans NOT DETECTED NOT DETECTED Final   Candida auris NOT DETECTED NOT DETECTED Final   Candida glabrata NOT DETECTED NOT DETECTED Final   Candida krusei NOT DETECTED NOT DETECTED Final   Candida parapsilosis NOT DETECTED NOT DETECTED Final   Candida tropicalis NOT DETECTED NOT DETECTED Final   Cryptococcus neoformans/gattii NOT DETECTED NOT DETECTED Final    Comment: Performed at [facility], [place] [name], [place], [place] [address]  Urine culture     Status: Abnormal   Collection Time: 02/16/20 11:19 AM   Specimen: In/Out Cath Urine  Result Value Ref Range Status   Specimen Description IN/OUT CATH URINE  Final   Special Requests   Final    NONE Performed at [facility], [place] [address] [place], [place] [address]    Culture 20,000 COLONIES/mL ESCHERICHIA COLI (A)  Final   Report Status 02/18/2020 FINAL  Final   Organism ID, Bacteria ESCHERICHIA COLI (A)  Final      Susceptibility   Escherichia coli - MIC*    AMPICILLIN >=32 RESISTANT Resistant     CEFAZOLIN <=4 SENSITIVE Sensitive     CEFEPIME <=0.12 SENSITIVE Sensitive     CEFTRIAXONE <=0.25 SENSITIVE Sensitive     CIPROFLOXACIN <=0.25 SENSITIVE Sensitive     GENTAMICIN >=16 RESISTANT Resistant     IMIPENEM 0.5 SENSITIVE Sensitive     NITROFURANTOIN <=16 SENSITIVE Sensitive     TRIMETH/SULFA >=320 RESISTANT Resistant     AMPICILLIN/SULBACTAM 16 INTERMEDIATE Intermediate     PIP/TAZO <=4 SENSITIVE Sensitive     * 20,000 COLONIES/mL ESCHERICHIA COLI     Radiology Studies: No results found.  Marzetta Board, MD, PhD [facility]  Between 7 am - 7 pm I am available, please  contact me via Amion or Securechat  Between 7 pm - 7 am I am not available, please contact night coverage MD/APP via Amion

## 2020-02-23 NOTE — Progress Notes (Signed)
Physical Therapy Treatment Patient Details Name: Joann Gutierrez MRN: [id number] DOB: 02/07/1945 Today's Date: 02/23/2020    History of Present Illness 75 y.o. female with a hx of chronic respiratory failure on 2L via [place] @ baseline, CHF last EF 65-70%, pulmonary hypertension, CKD, DM, COPD, anemia, hyperlipidemia, sleep apnea on CPAP @ night, & anemia who presents to the emergency department via EMS for progressively worsening shortness of breath x 6 days. Patient found to have respiratory failure 2/2 COVID PNA.    PT Comments    Pt remains pleasantly confused, during session requiring increased cuing and time for task completion. Pt requires min A for coming to EoB. Pt with increased fear of falling with transfers and ambulation, requiring reassurance and modA for sit>stand and stepping transfer to recliner. D/c plans remain appropriate. PT will continue to follow acutely.   Follow Up Recommendations  SNF;Supervision/Assistance - 24 hour     Equipment Recommendations  Rolling walker with 5" wheels;3in1 (PT)    Recommendations for Other Services OT consult     Precautions / Restrictions Precautions Precautions: Fall Precaution Comments: watch O2 Restrictions Weight Bearing Restrictions: No    Mobility  Bed Mobility Overal bed mobility: Needs Assistance Bed Mobility: Supine to Sit     Supine to sit: Min assist     General bed mobility comments: min A for initiation of LE movement towards EoB, and bringinging L UE across body to reach to bed rail, as well as for providing pad scoot of hips to EoB  Transfers Overall transfer level: Needs assistance Equipment used: Rolling walker (2 wheeled) Transfers: Sit to/from Stand Sit to Stand: Mod assist;From elevated surface;+2 physical assistance         General transfer comment: modAx2 for power up to standing, increased cuing to let go of bed rail, pt with slight LoB back wards with letting go, states "don't let me fall"  once stabilized pt reluctantly agrees to stepping to chair  Ambulation/Gait Ambulation/Gait assistance: Mod assist Gait Distance (Feet): 2 Feet Assistive device: 1 person hand held assist Gait Pattern/deviations: Step-to pattern;Decreased step length - right;Decreased step length - left;Shuffle Gait velocity: slowed Gait velocity interpretation: <1.31 ft/sec, indicative of household ambulator General Gait Details: face to face HHA with mod A for steadying with slow, lateral stepping to recliner on her R.         Balance Overall balance assessment: Needs assistance Sitting-balance support: No upper extremity supported;Feet supported Sitting balance-[name]: Fair     Standing balance support: Bilateral upper extremity supported;During functional activity Standing balance-[name]: Poor Standing balance comment: reliant on UE support and external assist                            Cognition Arousal/Alertness: Awake/alert Behavior During Therapy: Anxious Overall Cognitive Status: History of cognitive impairments - at baseline                                 General Comments: pt pleasantly confused, increased time and cuing for command follow         General Comments General comments (skin integrity, edema, etc.): Pt on 2L O2 via [place] VSS      Pertinent Vitals/Pain Pain Assessment: Faces Faces Pain Scale: Hurts little more Pain Location: back Pain Descriptors / Indicators: Discomfort;Grimacing Pain Intervention(s): Limited activity within patient's tolerance;Monitored during session;Repositioned  PT Goals (current goals can now be found in the care plan section) Acute Rehab PT Goals Patient Stated Goal: to be safe PT Goal Formulation: Patient unable to participate in goal setting Time For Goal Achievement: 03/04/20 Potential to Achieve Goals: Fair    Frequency    Min 3X/week      PT Plan Current plan remains  appropriate    Co-evaluation              AM-PAC PT "6 Clicks" Mobility   Outcome Measure  Help needed turning from your back to your side while in a flat bed without using bedrails?: A Little Help needed moving from lying on your back to sitting on the side of a flat bed without using bedrails?: A Little Help needed moving to and from a bed to a chair (including a wheelchair)?: A Lot Help needed standing up from a chair using your arms (e.g., wheelchair or bedside chair)?: A Lot Help needed to walk in hospital room?: A Lot Help needed climbing 3-5 steps with a railing? : Total 6 Click Score: 13    End of Session Equipment Utilized During Treatment: Gait belt;Oxygen Activity Tolerance: Other (comment) (Limited by fear of falling) Patient left: with call bell/phone within reach;in chair;with chair alarm set Nurse Communication: Mobility status PT Visit Diagnosis: Unsteadiness on feet (R26.81);Muscle weakness (generalized) (M62.81);Other abnormalities of gait and mobility (R26.89)     Time: 0141-0301 PT Time Calculation (min) (ACUTE ONLY): 23 min  Charges:  $Therapeutic Activity: 23-37 mins                     Vannak [place] B. Migdalia Dk PT, DPT Acute Rehabilitation Services Pager [phone] Office [phone]    [place] 02/23/2020, 11:16 AM

## 2020-02-23 NOTE — Progress Notes (Signed)
Patient off the unit for dialysis.

## 2020-02-23 NOTE — Progress Notes (Signed)
Pt placed on cpap for the night. °

## 2020-02-23 NOTE — Progress Notes (Signed)
RN spoke with patient's sister, Renford Dills. She would like the doctor to call her and update her about patient plan of care. She may be reached at [phone]

## 2020-02-23 NOTE — TOC Initial Note (Signed)
Transition of Care [facility]) - Initial/Assessment Note    Patient Details  Name: Joann Gutierrez MRN: [id number] Date of Birth: 04-22-1945  Transition of Care [facility]) CM/SW Contact:    Benard Halsted, LCSW Phone Number: 02/23/2020, 5:09 PM  Clinical Narrative:                 CSW received consult for possible SNF placement at time of discharge. CSW spoke with patient's daughter (works part time at [employer]) as patient is currently disoriented. Patient's daughter reported that patient's significant other, [name], is currently unable to care for patient at their home given patient's current physical needs and fall risk. She reported that there are some difficult family dynamics going on with patient's sister, Renford Dills (who also takes care of another sister with special needs in the house next door to patient). She stated that patient is confused and keeps saying that [name] needs to take out all of her money from the bank to keep anyone from getting it. CSW explained that as next of kin, she and her brother are patient's legal medical decision makers since there is no POA paperwork. Patient's daughter expressed understanding of PT recommendation along with her brother and they are agreeable to SNF placement at time of discharge. CSW discussed insurance authorization process. Patient has not received the COVID vaccines. Confirmed address. Patient has a cpap at home but it may need updating. CSW will continue to follow as outpatient dialysis needs and COVID precautions are a barrier to placement.    Expected Discharge Plan: Skilled Nursing Facility Barriers to Discharge: Continued Medical Work up   Patient Goals and CMS Choice Patient states their goals for this hospitalization and ongoing recovery are:: Rehab CMS Medicare.gov Compare Post Acute Care list provided to:: Patient Choice offered to / list presented to : Adult Children  Expected Discharge Plan and Services Expected Discharge Plan:  [place] In-house Referral: Clinical Social Work   Post Acute Care Choice: [place] Living arrangements for the past 2 months: [place]                                      Prior Living Arrangements/Services Living arrangements for the past 2 months: Single Family Home Lives with:: Significant Other Patient language and need for interpreter reviewed:: Yes Do you feel safe going back to the place where you live?: Yes      Need for Family Participation in Patient Care: Yes (Comment) Care giver support system in place?: Yes (comment)   Criminal Activity/Legal Involvement Pertinent to Current Situation/Hospitalization: No - Comment as needed  Activities of Daily Living      Permission Sought/Granted Permission sought to share information with : Facility Contact Representative,Family Supports Permission granted to share information with : Yes, Verbal Permission Granted  Share Information with NAME: [name]  Permission granted to share info w AGENCY: SNFs  Permission granted to share info w Relationship: Daughter  Permission granted to share info w Contact Information: [phone]  Emotional Assessment   Attitude/Demeanor/Rapport: Unable to Assess Affect (typically observed): Unable to Assess Orientation: : Oriented to Self Alcohol / Substance Use: Not Applicable Psych Involvement: No (comment)  Admission diagnosis:  AKI (acute kidney injury) ([place]) [N17.9] Acute hypoxemic respiratory failure ([place]) [J96.01] Pneumonia due to COVID-19 virus [U07.1, J12.82] COVID-19 [U07.1] Patient Active Problem List   Diagnosis Date Noted  . Sepsis ([place])  02/16/2020  . Acute on chronic respiratory failure with hypoxia ([place]) 02/16/2020  . Edema of left lower leg 02/16/2020  . Pneumonia due to COVID-19 virus 02/15/2020  . Acute on chronic diastolic HF (heart failure) ([place]) 11/18/2019  . Dyslipidemia 11/18/2019  . Polypharmacy 04/13/2019  .  Healthcare maintenance 10/15/2017  . ARF (acute renal failure) ([place]) 09/07/2017  . Chest pain 09/05/2017  . Ineffective health maintenance 06/26/2017  . Medication management 06/26/2017  . Retinal edema 10/07/2016  . Postherpetic neuralgia 12/21/2015  . Herpes zoster with complication 28/36/6294  . Cellulitis of face 11/27/2015  . Anemia of chronic disease 08/17/2015  . History of colonic polyps   . Hypertensive emergency 05/20/2015  . Hypokalemia 05/20/2015  . AKI (acute kidney injury) ([place])   . Esophageal dysmotility 03/01/2015  . Throat congestion 10/11/2014  . Morbid obesity due to excess calories ([place]) 09/23/2014  . Acute asthmatic bronchitis 05/20/2014  . Abdominal pain, other specified site 09/13/2013  . Elevated liver enzymes 09/13/2013  . Cough 05/05/2013  . Pain in lower limb 03/05/2013  . DOE (dyspnea on exertion) 02/03/2013  . Chronic respiratory failure ([place]) 12/13/2012  . Stricture and stenosis of esophagus 08/31/2012  . Onychomycosis 06/01/2012  . Pain in joint, ankle and foot 06/01/2012  . Insulin dependent diabetes mellitus 04/29/2012  . Essential hypertension 07/19/2011  . CKD (chronic kidney disease), stage III ([place]) 05/19/2011  . Family history of malignant neoplasm of gastrointestinal tract 10/30/2010  . Bloating 10/16/2010  . Epigastric pain 10/16/2010  . Foot pain 09/25/2010  . Dysphagia 07/16/2010  . GERD (gastroesophageal reflux disease) 07/16/2010  . Microcytic anemia 07/16/2010  . Obstructive sleep apnea 06/16/2009  . HYPERCHOLESTEROLEMIA 02/16/2009  . 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 Gastroparesis 08/21/2007  . Chronic obstructive asthma ([place]) 08/09/2006   PCP:  Andree Moro, DO Pharmacy:   [facility] [place], [place] - [address] [place] [address] Phone: [phone] Fax: [phone]     Social Determinants of Health (SDOH) Interventions    Readmission Risk Interventions No flowsheet  data found.

## 2020-02-24 DIAGNOSIS — U071 COVID-19: Secondary | ICD-10-CM | POA: Diagnosis not present

## 2020-02-24 DIAGNOSIS — J1282 Pneumonia due to coronavirus disease 2019: Secondary | ICD-10-CM | POA: Diagnosis not present

## 2020-02-24 LAB — BASIC METABOLIC PANEL
Anion gap: 15 (ref 5–15)
BUN: 35 mg/dL — ABNORMAL HIGH (ref 8–23)
CO2: 23 mmol/L (ref 22–32)
Calcium: 7.1 mg/dL — ABNORMAL LOW (ref 8.9–10.3)
Chloride: 99 mmol/L (ref 98–111)
Creatinine, Ser: 5.75 mg/dL — ABNORMAL HIGH (ref 0.44–1.00)
GFR, Estimated: 7 mL/min — ABNORMAL LOW (ref >60)
Glucose, Bld: 174 mg/dL — ABNORMAL HIGH (ref 70–99)
Potassium: 3.7 mmol/L (ref 3.5–5.1)
Sodium: 137 mmol/L (ref 135–145)

## 2020-02-24 LAB — CBC
HCT: 23.7 % — ABNORMAL LOW (ref 36.0–46.0)
Hemoglobin: 7.3 g/dL — ABNORMAL LOW (ref 12.0–15.0)
MCH: 20.4 pg — ABNORMAL LOW (ref 26.0–34.0)
MCHC: 30.8 g/dL (ref 30.0–36.0)
MCV: 66.4 fL — ABNORMAL LOW (ref 80.0–100.0)
Platelets: 207 10*3/uL (ref 150–400)
RBC: 3.57 MIL/uL — ABNORMAL LOW (ref 3.87–5.11)
RDW: 21.1 % — ABNORMAL HIGH (ref 11.5–15.5)
WBC: 12.4 10*3/uL — ABNORMAL HIGH (ref 4.0–10.5)
nRBC: 0.6 % — ABNORMAL HIGH (ref 0.0–0.2)

## 2020-02-24 LAB — GLUCOSE, CAPILLARY
Glucose-Capillary: 130 mg/dL — ABNORMAL HIGH (ref 70–99)
Glucose-Capillary: 153 mg/dL — ABNORMAL HIGH (ref 70–99)
Glucose-Capillary: 167 mg/dL — ABNORMAL HIGH (ref 70–99)
Glucose-Capillary: 259 mg/dL — ABNORMAL HIGH (ref 70–99)
Glucose-Capillary: 285 mg/dL — ABNORMAL HIGH (ref 70–99)

## 2020-02-24 MED ORDER — PREDNISONE 20 MG PO TABS
20.0000 mg | ORAL_TABLET | Freq: Every day | ORAL | Status: AC
  Administered 2020-02-25 – 2020-02-27 (×3): 20 mg via ORAL
  Filled 2020-02-24 (×3): qty 1

## 2020-02-24 MED ORDER — DONEPEZIL HCL 5 MG PO TABS
5.0000 mg | ORAL_TABLET | Freq: Every day | ORAL | Status: DC
  Administered 2020-02-24 – 2020-03-01 (×7): 5 mg via ORAL
  Filled 2020-02-24 (×7): qty 1

## 2020-02-24 MED ORDER — CHLORHEXIDINE GLUCONATE CLOTH 2 % EX PADS
6.0000 | MEDICATED_PAD | Freq: Every day | CUTANEOUS | Status: DC
  Administered 2020-02-26 – 2020-02-29 (×4): 6 via TOPICAL

## 2020-02-24 NOTE — Progress Notes (Signed)
Patient sitting on side of bed, refuses to allow staff to assist her for pericare and back to bed.  Patient insisting to do own pericare, stool on bed, chair, will no allow staff to clean. Requests for patient to allow staff to help her are met with arguments about other experiences, not what is happening currently.  Patient becomes more agitated when staff attempt to clean her and room. Patient sitting on side of bed, bed alarm set, nurse tech outside door.

## 2020-02-24 NOTE — Progress Notes (Signed)
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  On arrival she was satting about 70%.  She initially required BiPAP and was transitioned to 5 L.  She was febrile to 101.8, and Covid was found to be positive  Subjective / 24h Interval events: She is doing well, slightly confused  Assessment & Plan: Principal Problem Acute on chronic hypoxic respiratory failure due to COVID-19 viral pneumonia-status post Remdesivir, on steroids, decrease dose to 20 mg and do 3 additional days as a quick taper -Initially on admission due to elevated procalcitonin she has been on ceftriaxone azithromycin for 7 days, currently monitor off antibiotics -Respiratory status stable currently on 3 L nasal cannula -There was concern for DVT due to left lower extremity edema and elevated D-dimer, but Doppler was negative  COVID-19 Labs  No results for input(s): DDIMER, FERRITIN, LDH, CRP in the last 72 hours.  Lab Results  Component Value Date   [place] (A) 02/15/2020   SARSCOV2NAA NOT DETECTED 11/12/2018   [place] NEGATIVE 07/31/2018   Active Problems Acute kidney injury on chronic kidney disease stage V, metabolic acidosis-nephrology consulted, had a temporary dialysis cath placed and she is now dialysis dependent with plans to arrange outpatient hemodialysis.  Lasix, spironolactone, losartan on hold  Acute metabolic encephalopathy, underlying dementia -Appears improved, due to Covid -resume home Aricept  Insulin-dependent diabetes mellitus -A1c 7.4, continue sliding scale  CBG (last 3)  Recent Labs    02/23/20 2045 02/24/20 0026  02/24/20 0801  GLUCAP 134* 167* 153*   Thrombocytopenia -Due to chronic renal disease, COVID-19.  Now normalized.  COPD -Stable, no wheezing    Scheduled Meds: . amLODipine  10 mg Oral QPM  . vitamin C  500 mg Oral Daily  . aspirin EC  81 mg Oral Daily  . carvedilol  25 mg Oral BID  . Chlorhexidine Gluconate Cloth  6 each Topical Q0600  . cholecalciferol  1,000 Units Oral Daily  . [START ON 02/25/2020] darbepoetin (ARANESP) injection - DIALYSIS  150 mcg Intravenous Q Fri-HD  . heparin injection (subcutaneous)  7,500 Units Subcutaneous Q8H  . hydrALAZINE  100 mg Oral TID  . insulin aspart  0-5 Units Subcutaneous QHS  . insulin aspart  0-6 Units Subcutaneous TID WC  . insulin detemir  14 Units Subcutaneous Q2200  . predniSONE  50 mg Oral Daily  . zinc sulfate  220 mg Oral Daily   Continuous Infusions: PRN Meds:.acetaminophen, guaiFENesin-dextromethorphan, Ipratropium-Albuterol, lip balm, ondansetron (ZOFRAN) IV  Diet Orders (From admission, onward)    Start     Ordered   02/20/20 0802  Diet renal/carb modified with fluid restriction Diet-HS Snack? Nothing; Fluid restriction: 1200 mL Fluid; Room service appropriate? No; Fluid consistency: Thin  Diet effective now       Question Answer Comment  Diet-HS Snack? Nothing   Fluid restriction: 1200 mL Fluid   Room service appropriate? No   Fluid consistency: Thin      02/20/20 0801          DVT prophylaxis:      Code Status: Full Code  Family Communication: no family at bedside, updated daughter over the phone  Status is: Inpatient  Remains inpatient appropriate because:Inpatient level of care appropriate due to severity of illness   Dispo: The patient is from: Home              Anticipated d/c is to: SNF              Anticipated d/c date is: 2 days              Patient currently is not medically stable to d/c.   Difficult to place patient No  Level of care: Telemetry Medical  Consultants:  Nephrology   IR  Procedures:  none  Microbiology  none  Antimicrobials: none    Objective: Vitals:   02/23/20 1530 02/23/20 1552 02/23/20 2005 02/24/20 0434  BP: (!) 152/75 (!) 169/77 (!) 167/61 (!) 158/75  Pulse: 72 75 85 77  Resp:  (!) 22 20 19   Temp:  98.1 F (36.7 C) 98.2 F (36.8 C) 98.2 F (36.8 C)  TempSrc:  Oral Oral Oral  SpO2:  96% 94% 95%  Weight:      Height:        Intake/Output Summary (Last 24 hours) at 02/24/2020 1045 Last data filed at 02/24/2020 1000 Gross per 24 hour  Intake 360 ml  Output 1005 ml  Net -645 ml   Filed Weights   02/22/20 0552 02/22/20 1604 02/23/20 1310  Weight: 113.3 kg 114.3 kg 115 kg    Examination:  Constitutional: In no distress Eyes: No icterus ENMT: mmm Neck: normal, supple Respiratory: Overall clear, no wheezing or crackles Cardiovascular: Regular rate and rhythm, no murmurs, trace edema Abdomen: Soft, NT, ND, bowel sounds positive Musculoskeletal: no clubbing / cyanosis.  Skin: No rashes seen Neurologic: Nonfocal, equal strength  Data Reviewed: I have independently reviewed following labs and imaging studies   CBC: Recent Labs  Lab 02/18/20 0330 02/19/20 0104 02/20/20 0315 02/21/20 0124 02/23/20 0234 02/24/20 0150  WBC 6.4 5.8 6.8 10.4 12.5* 12.4*  NEUTROABS 4.5 4.0 5.3 5.9  --   --   HGB 8.7* 8.3* 8.4* 7.8* 7.8* 7.3*  HCT 28.6* 26.2* 26.1* 25.9* 24.5* 23.7*  MCV 67.3* 65.0* 64.3* 67.4* 66.0* 66.4*  PLT 127* 135* 167 179 216 664   Basic Metabolic Panel: Recent Labs  Lab 02/19/20 0104 02/20/20 0315 02/21/20 0124 02/23/20 0234 02/24/20 0150  NA 138 139 140 136 137  K 3.8 4.0 4.6 3.9 3.7  CL 104 104 105 100 99  CO2 20* 20* 19* 22 23  GLUCOSE 221* 134* 203* 308* 174*  BUN 77* 91* 111* 72* 35*  CREATININE 6.34* 8.20* 9.72* 8.89* 5.75*  CALCIUM 7.5* 7.7* 7.6* 7.5* 7.1*   Liver Function Tests: Recent Labs  Lab 02/18/20 0330 02/19/20 0104 02/20/20 0315 02/21/20 0124 02/23/20 0234  AST 27 32 20 24 26    ALT 9 10 8 5 7   ALKPHOS 48 69 59 62 69  BILITOT 0.8 0.4 0.6 0.3 0.7  PROT 6.3* 6.2* 6.2* 6.1* 6.1*  ALBUMIN 2.3* 2.3* 2.4* 2.4* 2.4*   Coagulation Profile: No results for input(s): INR, PROTIME in the last 168 hours. HbA1C: No results for input(s): HGBA1C in the last 72 hours. CBG: Recent Labs  Lab 02/23/20 1214 02/23/20 1712 02/23/20 2045 02/24/20 0026 02/24/20 0801  GLUCAP 103* 80 134* 167* 153*    Recent Results (from the past 240 hour(s))  SARS Coronavirus 2 by RT PCR (hospital order, performed in [facility] hospital lab) Nasopharyngeal Nasopharyngeal Swab     Status: Abnormal   Collection Time:  02/15/20  8:44 PM   Specimen: Nasopharyngeal Swab  Result Value Ref Range Status   SARS Coronavirus 2 POSITIVE (A) NEGATIVE Final    Comment: CRITICAL RESULT CALLED TO, READ BACK BY AND VERIFIED WITH: DR Aldona Bar P. [phone] [id number] FCP (NOTE) SARS-CoV-2 target nucleic acids are DETECTED  SARS-CoV-2 RNA is generally detectable in upper respiratory specimens  during the acute phase of infection.  Positive results are indicative  of the presence of the identified virus, but do not rule out bacterial infection or co-infection with other pathogens not detected by the test.  Clinical correlation with patient history and  other diagnostic information is necessary to determine patient infection status.  The expected result is negative.  Fact Sheet for Patients:   StrictlyIdeas.no   Fact Sheet for Healthcare Providers:   BankingDealers.co.za    This test is not yet approved or cleared by the [place] FDA and  has been authorized for detection and/or diagnosis of SARS-CoV-2 by FDA under an Emergency Use Authorization (EUA).  This EUA will remain in effect (meaning th is test can be used) for the duration of  the COVID-19 declaration under Section 564(b)(1) of the Act, 21 U.S.C. section 360-bbb-3(b)(1), unless the authorization  is terminated or revoked sooner.  Performed at [facility], [place] [address], [place], [place] [address]   Blood Culture (routine x 2)     Status: Abnormal   Collection Time: 02/15/20  9:04 PM   Specimen: BLOOD  Result Value Ref Range Status   Specimen Description BLOOD SITE NOT SPECIFIED  Final   Special Requests   Final    BOTTLES DRAWN AEROBIC AND ANAEROBIC Blood Culture adequate volume   Culture  Setup Time   Final    GRAM POSITIVE COCCI IN CHAINS AEROBIC BOTTLE ONLY Organism ID to follow CRITICAL RESULT CALLED TO, READ BACK BY AND VERIFIED WITH: A. Rogers Blocker PharmD 12:15 02/18/20 ([email or web address])    Culture (A)  Final    ALLOIOCOCCUS OTITIS Standardized susceptibility testing for this organism is not available. Performed at [facility], [place] [address], [place], [place] [address]    Report Status 02/22/2020 FINAL  Final  Blood Culture ID Panel (Reflexed)     Status: None   Collection Time: 02/15/20  9:04 PM  Result Value Ref Range Status   Enterococcus faecalis NOT DETECTED NOT DETECTED Final   Enterococcus Faecium NOT DETECTED NOT DETECTED Final   Listeria monocytogenes NOT DETECTED NOT DETECTED Final   Staphylococcus species NOT DETECTED NOT DETECTED Final   Staphylococcus aureus (BCID) NOT DETECTED NOT DETECTED Final   Staphylococcus epidermidis NOT DETECTED NOT DETECTED Final   Staphylococcus lugdunensis NOT DETECTED NOT DETECTED Final   Streptococcus species NOT DETECTED NOT DETECTED Final   Streptococcus agalactiae NOT DETECTED NOT DETECTED Final   Streptococcus pneumoniae NOT DETECTED NOT DETECTED Final   Streptococcus pyogenes NOT DETECTED NOT DETECTED Final   A.calcoaceticus-baumannii NOT DETECTED NOT DETECTED Final   Bacteroides fragilis NOT DETECTED NOT DETECTED Final   Enterobacterales NOT DETECTED NOT DETECTED Final   Enterobacter cloacae complex NOT DETECTED NOT DETECTED Final   Escherichia coli NOT DETECTED NOT DETECTED Final   Klebsiella aerogenes NOT  DETECTED NOT DETECTED Final   Klebsiella oxytoca NOT DETECTED NOT DETECTED Final   Klebsiella pneumoniae NOT DETECTED NOT DETECTED Final   Proteus species NOT DETECTED NOT DETECTED Final   Salmonella species NOT DETECTED NOT DETECTED Final   Serratia marcescens NOT DETECTED NOT DETECTED Final  Haemophilus influenzae NOT DETECTED NOT DETECTED Final   Neisseria meningitidis NOT DETECTED NOT DETECTED Final   Pseudomonas aeruginosa NOT DETECTED NOT DETECTED Final   Stenotrophomonas maltophilia NOT DETECTED NOT DETECTED Final   Candida albicans NOT DETECTED NOT DETECTED Final   Candida auris NOT DETECTED NOT DETECTED Final   Candida glabrata NOT DETECTED NOT DETECTED Final   Candida krusei NOT DETECTED NOT DETECTED Final   Candida parapsilosis NOT DETECTED NOT DETECTED Final   Candida tropicalis NOT DETECTED NOT DETECTED Final   Cryptococcus neoformans/gattii NOT DETECTED NOT DETECTED Final    Comment: Performed at [facility], [place] [address] [place], [place] [address]  Urine culture     Status: Abnormal   Collection Time: 02/16/20 11:19 AM   Specimen: In/Out Cath Urine  Result Value Ref Range Status   Specimen Description IN/OUT CATH URINE  Final   Special Requests   Final    NONE Performed at [facility], [place] [address], [place], [place] [address]    Culture 20,000 COLONIES/mL ESCHERICHIA COLI (A)  Final   Report Status 02/18/2020 FINAL  Final   Organism ID, Bacteria ESCHERICHIA COLI (A)  Final      Susceptibility   Escherichia coli - MIC*    AMPICILLIN >=32 RESISTANT Resistant     CEFAZOLIN <=4 SENSITIVE Sensitive     CEFEPIME <=0.12 SENSITIVE Sensitive     CEFTRIAXONE <=0.25 SENSITIVE Sensitive     CIPROFLOXACIN <=0.25 SENSITIVE Sensitive     GENTAMICIN >=16 RESISTANT Resistant     IMIPENEM 0.5 SENSITIVE Sensitive     NITROFURANTOIN <=16 SENSITIVE Sensitive     TRIMETH/SULFA >=320 RESISTANT Resistant     AMPICILLIN/SULBACTAM 16 INTERMEDIATE Intermediate      PIP/TAZO <=4 SENSITIVE Sensitive     * 20,000 COLONIES/mL ESCHERICHIA COLI     Radiology Studies: No results found.  Marzetta Board, MD, PhD [facility]  Between 7 am - 7 pm I am available, please contact me via Amion or Securechat  Between 7 pm - 7 am I am not available, please contact night coverage MD/APP via Amion

## 2020-02-24 NOTE — Plan of Care (Signed)
  Problem: Education: Goal: Knowledge of General Education information will improve Description: Including pain rating scale, medication(s)/side effects and non-pharmacologic comfort measures Outcome: Progressing   Problem: Health Behavior/Discharge Planning: Goal: Ability to manage health-related needs will improve Outcome: Progressing   Problem: Clinical Measurements: Goal: Ability to maintain clinical measurements within normal limits will improve Outcome: Progressing Goal: Will remain free from infection Outcome: Progressing Goal: Diagnostic test results will improve Outcome: Progressing Goal: Respiratory complications will improve Outcome: Progressing Goal: Cardiovascular complication will be avoided Outcome: Progressing   Problem: Activity: Goal: Risk for activity intolerance will decrease Outcome: Progressing   Problem: Nutrition: Goal: Adequate nutrition will be maintained Outcome: Progressing   Problem: Coping: Goal: Level of anxiety will decrease Outcome: Progressing   Problem: Elimination: Goal: Will not experience complications related to bowel motility Outcome: Progressing Goal: Will not experience complications related to urinary retention Outcome: Progressing   Problem: Pain Managment: Goal: General experience of comfort will improve Outcome: Progressing   Problem: Safety: Goal: Ability to remain free from injury will improve Outcome: Progressing   Problem: Skin Integrity: Goal: Risk for impaired skin integrity will decrease Outcome: Progressing   Problem: Education: Goal: Knowledge of risk factors and measures for prevention of condition will improve Outcome: Progressing   Problem: Coping: Goal: Psychosocial and spiritual needs will be supported Outcome: Progressing   Problem: Respiratory: Goal: Will maintain a patent airway Outcome: Progressing Goal: Complications related to the disease process, condition or treatment will be avoided or  minimized Outcome: Progressing   Problem: Education: Goal: Knowledge of disease and its progression will improve Outcome: Progressing Goal: Individualized Educational Video(s) Outcome: Progressing   Problem: Fluid Volume: Goal: Compliance with measures to maintain balanced fluid volume will improve Outcome: Progressing   Problem: Health Behavior/Discharge Planning: Goal: Ability to manage health-related needs will improve Outcome: Progressing   Problem: Nutritional: Goal: Ability to make healthy dietary choices will improve Outcome: Progressing   Problem: Clinical Measurements: Goal: Complications related to the disease process, condition or treatment will be avoided or minimized Outcome: Progressing

## 2020-02-24 NOTE — Progress Notes (Signed)
PT Cancellation Note  Patient Details Name: Joann Gutierrez MRN: [id number] DOB: 09-Feb-1945   Cancelled Treatment:    Reason Eval/Treat Not Completed: (P) Patient declined, no reason specified Attempted to see pt twice. First time she requested to come back in an hour. With return an hour later pt again refused to move with therapy. PT will follow back tomorrow for therapy.  Lilyan Prete B. Migdalia Dk PT, DPT Acute Rehabilitation Services Pager [phone] Office [phone]  [place] 02/24/2020, 12:05 PM

## 2020-02-24 NOTE — Progress Notes (Signed)
Physical Therapy Treatment Patient Details Name: Joann Gutierrez MRN: [id number] DOB: [birth year] Today's Date: 02/24/2020    History of Present Illness 75 y.o. female with a hx of chronic respiratory failure on 2L via [place] @ baseline, CHF last EF 65-70%, pulmonary hypertension, CKD, DM, COPD, anemia, hyperlipidemia, sleep apnea on CPAP @ night, & anemia who presents to the emergency department via EMS for progressively worsening shortness of breath x 6 days. Patient found to have respiratory failure 2/2 COVID PNA.    PT Comments    RN requested assistance with getting pt to move for pericare as NT had found her to be incontinent of stool and was refusing to roll for cleaning. Pt very agitated on entry. With increased cuing pt ultimately agreed to stand up for pericare and to have her linens changed. Pt requires min guard for bed mobility, min A for coming to standing and min A for stepping 5 feet to recliner with RW. Pt able to stand for 5 minutes while NT cleaned her. Once pt in chair gown was changed and pt legs elevated. Pt less agitated and on phone with her sister. D/c plans remain appropriate. PT will continue to follow acutely.     Follow Up Recommendations  SNF;Supervision/Assistance - 24 hour     Equipment Recommendations  Rolling walker with 5" wheels;3in1 (PT)    Recommendations for Other Services OT consult     Precautions / Restrictions Precautions Precautions: Fall Precaution Comments: watch O2 Restrictions Weight Bearing Restrictions: No    Mobility  Bed Mobility Overal bed mobility: Needs Assistance Bed Mobility: Supine to Sit     Supine to sit: Min guard     General bed mobility comments: min guard for safety, maximal cuing for sequencing and increased time due to pt constant commentary on how the NT had not been rough on her  Transfers Overall transfer level: Needs assistance Equipment used: Rolling walker (2 wheeled) Transfers: Sit to/from Stand Sit to  Stand: From elevated surface;Min assist         General transfer comment: min A for steadying in standing with power up to RW  Ambulation/Gait Ambulation/Gait assistance: Min assist   Assistive device: Rolling walker (2 wheeled) Gait Pattern/deviations: Step-to pattern;Decreased step length - right;Decreased step length - left;Shuffle Gait velocity: slowed Gait velocity interpretation: <1.31 ft/sec, indicative of household ambulator General Gait Details: min A for RW navigation, and vc for sequencing         Balance Overall balance assessment: Needs assistance Sitting-balance support: No upper extremity supported;Feet supported Sitting balance-[name]: Fair     Standing balance support: Bilateral upper extremity supported;During functional activity Standing balance-[name]: Poor Standing balance comment: pt able to stand for 5 min while pericare performed while holding onto bed rail and RW                            Cognition Arousal/Alertness: Awake/alert Behavior During Therapy: Restless;Agitated Overall Cognitive Status: History of cognitive impairments - at baseline                                 General Comments: pt very upset with NT attempts to perform pericare. Pt reports feeling "pushed around". Pt requires increased redirection to be able to stand for pericare and for linens to be changed, Pt with decreased memory of therapist, repeatedly talking about therapist asking her to  get out of bed earlier in the day with no recognition that I was that therapist.         General Comments General comments (skin integrity, edema, etc.): VSS on 3L O2 via [place]      Pertinent Vitals/Pain Pain Assessment: Faces Faces Pain Scale: Hurts little more Pain Location: back with movement Pain Descriptors / Indicators: Discomfort;Grimacing Pain Intervention(s): Limited activity within patient's tolerance;Monitored during session;Repositioned            PT Goals (current goals can now be found in the care plan section) Acute Rehab PT Goals Patient Stated Goal: to be safe PT Goal Formulation: Patient unable to participate in goal setting Time For Goal Achievement: 03/04/20 Potential to Achieve Goals: Fair Progress towards PT goals: Progressing toward goals    Frequency    Min 3X/week      PT Plan Current plan remains appropriate       AM-PAC PT "6 Clicks" Mobility   Outcome Measure  Help needed turning from your back to your side while in a flat bed without using bedrails?: A Little Help needed moving from lying on your back to sitting on the side of a flat bed without using bedrails?: A Little Help needed moving to and from a bed to a chair (including a wheelchair)?: A Lot Help needed standing up from a chair using your arms (e.g., wheelchair or bedside chair)?: A Lot Help needed to walk in hospital room?: A Lot Help needed climbing 3-5 steps with a railing? : Total 6 Click Score: 13    End of Session Equipment Utilized During Treatment: Oxygen Activity Tolerance: Treatment limited secondary to agitation Patient left: with call bell/phone within reach;in chair;with chair alarm set Nurse Communication: Mobility status PT Visit Diagnosis: Unsteadiness on feet (R26.81);Muscle weakness (generalized) (M62.81);Other abnormalities of gait and mobility (R26.89)     Time: 1510-1536 PT Time Calculation (min) (ACUTE ONLY): 26 min  Charges:  $Therapeutic Activity: 23-37 mins                     Joann Gutierrez PT, DPT Acute Rehabilitation Services Pager [phone] Office [phone]    [place] 02/24/2020, 4:17 PM
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
## 2020-02-24 NOTE — Progress Notes (Signed)
Patient took PM meds without difficulty.  Able to follow commands.  Bed alarm on, call light within reach.

## 2020-02-24 NOTE — TOC Progression Note (Signed)
Transition of Care [facility]) - Progression Note    Patient Details  Name: Joann Gutierrez MRN: [id number] Date of Birth: Jul 02, 1945  Transition of [facility]) CM/SW [place], LCSW Phone Number: 02/24/2020, 5:23 PM  Clinical Narrative:    CSW left voicemail for patient's son, [name] [phone]) to discuss patient's discharge plan.    Expected Discharge Plan: [place] Barriers to Discharge: Continued Medical Work up  Expected Discharge Plan and Services Expected Discharge Plan: [place] In-house Referral: Clinical Social Work   Post Acute Care Choice: [place] Living arrangements for the past 2 months: Single Family Home                                       Social Determinants of Health (SDOH) Interventions    Readmission Risk Interventions No flowsheet data found.

## 2020-02-25 DIAGNOSIS — U071 COVID-19: Secondary | ICD-10-CM | POA: Diagnosis not present

## 2020-02-25 DIAGNOSIS — J1282 Pneumonia due to coronavirus disease 2019: Secondary | ICD-10-CM | POA: Diagnosis not present

## 2020-02-25 LAB — CBC
HCT: 22.1 % — ABNORMAL LOW (ref 36.0–46.0)
Hemoglobin: 7 g/dL — ABNORMAL LOW (ref 12.0–15.0)
MCH: 20.8 pg — ABNORMAL LOW (ref 26.0–34.0)
MCHC: 31.7 g/dL (ref 30.0–36.0)
MCV: 65.8 fL — ABNORMAL LOW (ref 80.0–100.0)
Platelets: 206 10*3/uL (ref 150–400)
RBC: 3.36 MIL/uL — ABNORMAL LOW (ref 3.87–5.11)
RDW: 21 % — ABNORMAL HIGH (ref 11.5–15.5)
WBC: 15.6 10*3/uL — ABNORMAL HIGH (ref 4.0–10.5)
nRBC: 0.8 % — ABNORMAL HIGH (ref 0.0–0.2)

## 2020-02-25 LAB — BASIC METABOLIC PANEL
Anion gap: 14 (ref 5–15)
BUN: 46 mg/dL — ABNORMAL HIGH (ref 8–23)
CO2: 24 mmol/L (ref 22–32)
Calcium: 7.6 mg/dL — ABNORMAL LOW (ref 8.9–10.3)
Chloride: 98 mmol/L (ref 98–111)
Creatinine, Ser: 7.06 mg/dL — ABNORMAL HIGH (ref 0.44–1.00)
GFR, Estimated: 6 mL/min — ABNORMAL LOW (ref >60)
Glucose, Bld: 168 mg/dL — ABNORMAL HIGH (ref 70–99)
Potassium: 3.6 mmol/L (ref 3.5–5.1)
Sodium: 136 mmol/L (ref 135–145)

## 2020-02-25 LAB — GLUCOSE, CAPILLARY
Glucose-Capillary: 112 mg/dL — ABNORMAL HIGH (ref 70–99)
Glucose-Capillary: 225 mg/dL — ABNORMAL HIGH (ref 70–99)
Glucose-Capillary: 189 mg/dL — ABNORMAL HIGH (ref 70–99)
Glucose-Capillary: 310 mg/dL — ABNORMAL HIGH (ref 70–99)

## 2020-02-25 MED ORDER — DARBEPOETIN ALFA 150 MCG/0.3ML IJ SOSY
PREFILLED_SYRINGE | INTRAMUSCULAR | Status: AC
  Filled 2020-02-25: qty 0.3

## 2020-02-25 MED ORDER — DARBEPOETIN ALFA 150 MCG/0.3ML IJ SOSY
150.0000 ug | PREFILLED_SYRINGE | Freq: Once | INTRAMUSCULAR | Status: AC
  Filled 2020-02-25: qty 0.3

## 2020-02-25 MED ORDER — HEPARIN SODIUM (PORCINE) 1000 UNIT/ML IJ SOLN
INTRAMUSCULAR | Status: AC
  Administered 2020-02-25: 1000 [IU]
  Filled 2020-02-25: qty 4

## 2020-02-25 NOTE — Care Management Important Message (Signed)
Important Message  Patient Details  Name: Joann Gutierrez MRN: [id number] Date of Birth: Jul 17, 1945   Medicare Important Message Given:  Yes - Important Message mailed due to current National Emergency  Verbal consent obtained due to current National Emergency  Relationship to patient: Self Contact Name: Madelynn Call Date: 02/25/20  Time: 1456 Phone: [phone] Outcome: No Answer/Busy Important Message mailed to: Patient address on file    Delorse Lek 02/25/2020, 2:56 PM

## 2020-02-25 NOTE — Progress Notes (Signed)
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  On arrival she was satting about 70%.  She initially required BiPAP and was transitioned to 5 L.  She was febrile to 101.8, and Covid was found to be positive  Subjective / 24h Interval events: No complaints today, tells me she is feeling much better.  Assessment & Plan: Principal Problem Acute on chronic hypoxic respiratory failure due to COVID-19 viral pneumonia-status post Remdesivir, on steroids, decrease dose to 20 mg and do 3 additional days as a quick taper -Initially on admission due to elevated procalcitonin she has been on ceftriaxone azithromycin for 7 days, currently monitor off antibiotics -Respiratory status stable currently on 3 L nasal cannula -There was concern for DVT due to left lower extremity edema and elevated D-dimer, but Doppler was negative -Patient needs 21 days of isolation from 1/25 when she first tested positive -Needs SNF, placement pending  Active Problems Acute kidney injury on chronic kidney disease stage V, metabolic acidosis, now end-stage-nephrology consulted, had a temporary dialysis cath placed and she is now dialysis dependent with plans to arrange outpatient hemodialysis.  Lasix, spironolactone, losartan on hold  Acute metabolic encephalopathy, underlying dementia -Appears improved, due to Covid -resume home Aricept -Her encephalopathy seems a lot better, she is able to converse without difficulties, has some memory issues which are obvious but thought process is appropriate  Insulin-dependent diabetes mellitus -A1c 7.4, continue sliding  scale  CBG (last 3)  Recent Labs    02/24/20 1800 02/24/20 2044 02/25/20 0754  GLUCAP 285* 259* 112*   Thrombocytopenia -Due to chronic renal disease, COVID-19.  Now normalized.  COPD -Stable, no wheezing  Anemia due to chronic kidney disease -No bleeding, hemoglobin 7.0, monitor  Scheduled Meds: . amLODipine  10 mg Oral QPM  . vitamin C  500 mg Oral Daily  . aspirin EC  81 mg Oral Daily  . carvedilol  25 mg Oral BID  . Chlorhexidine Gluconate Cloth  6 each Topical Q0600  . cholecalciferol  1,000 Units Oral Daily  . darbepoetin (ARANESP) injection - DIALYSIS  150 mcg Intravenous Q Fri-HD  . donepezil  5 mg Oral QHS  . heparin injection (subcutaneous)  7,500 Units Subcutaneous Q8H  . hydrALAZINE  100 mg Oral TID  . insulin aspart  0-5 Units Subcutaneous QHS  . insulin aspart  0-6 Units Subcutaneous TID WC  . insulin detemir  14 Units Subcutaneous Q2200  . predniSONE  20 mg Oral Daily  . zinc sulfate  220 mg Oral Daily   Continuous Infusions: PRN Meds:.acetaminophen, guaiFENesin-dextromethorphan, Ipratropium-Albuterol, lip balm, ondansetron (ZOFRAN) IV  Diet Orders (From admission, onward)    Start     Ordered   02/20/20 0802  Diet renal/carb modified with fluid restriction Diet-HS Snack? Nothing; Fluid restriction: 1200 mL Fluid; Room service appropriate? No; Fluid consistency: Thin  Diet effective now       Question Answer Comment  Diet-HS Snack? Nothing   Fluid restriction: 1200 mL Fluid   Room service appropriate? No   Fluid consistency: Thin      02/20/20 0801          DVT prophylaxis:  Code Status: Full Code  Family Communication: no family at bedside, updated daughter over the phone  Status is: Inpatient  Remains inpatient appropriate because:Inpatient level of care appropriate due to severity of illness   Dispo: The patient is from: Home              Anticipated d/c is to: SNF              Anticipated d/c date is: 2 days               Patient currently is not medically stable to d/c.   Difficult to place patient No  Level of care: Telemetry Medical  Consultants:  Nephrology  IR  Procedures:  none  Microbiology  none  Antimicrobials: none    Objective: Vitals:   02/24/20 2046 02/25/20 0402 02/25/20 0500 02/25/20 0731  BP: (!) 153/71 128/68  131/77  Pulse: 79 74  66  Resp: 19 18  20   Temp: 98.1 F (36.7 C) 98.1 F (36.7 C)  98.1 F (36.7 C)  TempSrc: Oral Oral  Axillary  SpO2: 93% 99%  93%  Weight:   114.4 kg   Height:        Intake/Output Summary (Last 24 hours) at 02/25/2020 1135 Last data filed at 02/25/2020 0800 Gross per 24 hour  Intake 240 ml  Output --  Net 240 ml   Filed Weights   02/22/20 1604 02/23/20 1310 02/25/20 0500  Weight: 114.3 kg 115 kg 114.4 kg    Examination:  Constitutional: No distress, sitting in the chair Eyes: No scleral icterus ENMT: Moist mucous membranes Neck: normal, supple Respiratory: bibasilar rhonchi, no wheezing, no crackles Cardiovascular: Regular rate and rhythm, no murmurs, trace edema Abdomen: Soft, NT, ND, bowel sounds positive Musculoskeletal: no clubbing / cyanosis.  Skin: No rashes seen Neurologic: Nonfocal, equal strength  Data Reviewed: I have independently reviewed following labs and imaging studies   CBC: Recent Labs  Lab 02/19/20 0104 02/20/20 0315 02/21/20 0124 02/23/20 0234 02/24/20 0150 02/25/20 0200  WBC 5.8 6.8 10.4 12.5* 12.4* 15.6*  NEUTROABS 4.0 5.3 5.9  --   --   --   HGB 8.3* 8.4* 7.8* 7.8* 7.3* 7.0*  HCT 26.2* 26.1* 25.9* 24.5* 23.7* 22.1*  MCV 65.0* 64.3* 67.4* 66.0* 66.4* 65.8*  PLT 135* 167 179 216 207 443   Basic Metabolic Panel: Recent Labs  Lab 02/20/20 0315 02/21/20 0124 02/23/20 0234 02/24/20 0150 02/25/20 0200  NA 139 140 136 137 136  K 4.0 4.6 3.9 3.7 3.6  CL 104 105 100 99 98  CO2 20* 19* 22 23 24   GLUCOSE 134* 203* 308* 174* 168*  BUN 91* 111* 72* 35* 46*  CREATININE 8.20* 9.72* 8.89* 5.75*  7.06*  CALCIUM 7.7* 7.6* 7.5* 7.1* 7.6*   Liver Function Tests: Recent Labs  Lab 02/19/20 0104 02/20/20 0315 02/21/20 0124 02/23/20 0234  AST 32 20 24 26   ALT 10 8 5 7   ALKPHOS 69 59 62 69  BILITOT 0.4 0.6 0.3 0.7  PROT 6.2* 6.2* 6.1* 6.1*  ALBUMIN 2.3* 2.4* 2.4* 2.4*   Coagulation Profile: No results for input(s): INR, PROTIME in the last 168 hours. HbA1C: No results for input(s): HGBA1C in the last 72 hours. CBG: Recent Labs  Lab 02/24/20 0801 02/24/20 1230 02/24/20 1800 02/24/20 2044 02/25/20 0754  GLUCAP 153* 130* 285* 259* 112*    Recent Results (from the past 240 hour(s))  SARS Coronavirus 2 by RT PCR (hospital order, performed in  [facility] hospital lab) Nasopharyngeal Nasopharyngeal Swab     Status: Abnormal   Collection Time: 02/15/20  8:44 PM   Specimen: Nasopharyngeal Swab  Result Value Ref Range Status   SARS Coronavirus 2 POSITIVE (A) NEGATIVE Final    Comment: CRITICAL RESULT CALLED TO, READ BACK BY AND VERIFIED WITH: DR Aldona Bar P. [phone] FCP (NOTE) SARS-CoV-2 target nucleic acids are DETECTED  SARS-CoV-2 RNA is generally detectable in upper respiratory specimens  during the acute phase of infection.  Positive results are indicative  of the presence of the identified virus, but do not rule out bacterial infection or co-infection with other pathogens not detected by the test.  Clinical correlation with patient history and  other diagnostic information is necessary to determine patient infection status.  The expected result is negative.  Fact Sheet for Patients:   StrictlyIdeas.no   Fact Sheet for Healthcare Providers:   BankingDealers.co.za    This test is not yet approved or cleared by the [place] FDA and  has been authorized for detection and/or diagnosis of SARS-CoV-2 by FDA under an Emergency Use Authorization (EUA).  This EUA will remain in effect (meaning th is test can be used)  for the duration of  the COVID-19 declaration under Section 564(b)(1) of the Act, 21 U.S.C. section 360-bbb-3(b)(1), unless the authorization is terminated or revoked sooner.  Performed at [facility], [place] [address], [place], [place] [address]   Blood Culture (routine x 2)     Status: Abnormal   Collection Time: 02/15/20  9:04 PM   Specimen: BLOOD  Result Value Ref Range Status   Specimen Description BLOOD SITE NOT SPECIFIED  Final   Special Requests   Final    BOTTLES DRAWN AEROBIC AND ANAEROBIC Blood Culture adequate volume   Culture  Setup Time   Final    GRAM POSITIVE COCCI IN CHAINS AEROBIC BOTTLE ONLY Organism ID to follow CRITICAL RESULT CALLED TO, READ BACK BY AND VERIFIED WITH: A. Rogers Blocker PharmD 12:15 02/18/20 ([email or web address])    Culture (A)  Final    ALLOIOCOCCUS OTITIS Standardized susceptibility testing for this organism is not available. Performed at [facility], [place] [address], [place], [place] [address]    Report Status 02/22/2020 FINAL  Final  Blood Culture ID Panel (Reflexed)     Status: None   Collection Time: 02/15/20  9:04 PM  Result Value Ref Range Status   Enterococcus faecalis NOT DETECTED NOT DETECTED Final   Enterococcus Faecium NOT DETECTED NOT DETECTED Final   Listeria monocytogenes NOT DETECTED NOT DETECTED Final   Staphylococcus species NOT DETECTED NOT DETECTED Final   Staphylococcus aureus (BCID) NOT DETECTED NOT DETECTED Final   Staphylococcus epidermidis NOT DETECTED NOT DETECTED Final   Staphylococcus lugdunensis NOT DETECTED NOT DETECTED Final   Streptococcus species NOT DETECTED NOT DETECTED Final   Streptococcus agalactiae NOT DETECTED NOT DETECTED Final   Streptococcus pneumoniae NOT DETECTED NOT DETECTED Final   Streptococcus pyogenes NOT DETECTED NOT DETECTED Final   A.calcoaceticus-baumannii NOT DETECTED NOT DETECTED Final   Bacteroides fragilis NOT DETECTED NOT DETECTED Final   Enterobacterales NOT DETECTED NOT DETECTED Final    Enterobacter cloacae complex NOT DETECTED NOT DETECTED Final   Escherichia coli NOT DETECTED NOT DETECTED Final   Klebsiella aerogenes NOT DETECTED NOT DETECTED Final   Klebsiella oxytoca NOT DETECTED NOT DETECTED Final   Klebsiella pneumoniae NOT DETECTED NOT DETECTED Final   Proteus species NOT DETECTED NOT DETECTED Final   Salmonella  species NOT DETECTED NOT DETECTED Final   Serratia marcescens NOT DETECTED NOT DETECTED Final   Haemophilus influenzae NOT DETECTED NOT DETECTED Final   Neisseria meningitidis NOT DETECTED NOT DETECTED Final   Pseudomonas aeruginosa NOT DETECTED NOT DETECTED Final   Stenotrophomonas maltophilia NOT DETECTED NOT DETECTED Final   Candida albicans NOT DETECTED NOT DETECTED Final   Candida auris NOT DETECTED NOT DETECTED Final   Candida glabrata NOT DETECTED NOT DETECTED Final   Candida krusei NOT DETECTED NOT DETECTED Final   Candida parapsilosis NOT DETECTED NOT DETECTED Final   Candida tropicalis NOT DETECTED NOT DETECTED Final   Cryptococcus neoformans/gattii NOT DETECTED NOT DETECTED Final    Comment: Performed at [facility], [place] [address] [place], [place] [address]  Urine culture     Status: Abnormal   Collection Time: 02/16/20 11:19 AM   Specimen: In/Out Cath Urine  Result Value Ref Range Status   Specimen Description IN/OUT CATH URINE  Final   Special Requests   Final    NONE Performed at [facility], [place] [address], [place], [place] [address]    Culture 20,000 COLONIES/mL ESCHERICHIA COLI (A)  Final   Report Status 02/18/2020 FINAL  Final   Organism ID, Bacteria ESCHERICHIA COLI (A)  Final      Susceptibility   Escherichia coli - MIC*    AMPICILLIN >=32 RESISTANT Resistant     CEFAZOLIN <=4 SENSITIVE Sensitive     CEFEPIME <=0.12 SENSITIVE Sensitive     CEFTRIAXONE <=0.25 SENSITIVE Sensitive     CIPROFLOXACIN <=0.25 SENSITIVE Sensitive     GENTAMICIN >=16 RESISTANT Resistant     IMIPENEM 0.5 SENSITIVE Sensitive      NITROFURANTOIN <=16 SENSITIVE Sensitive     TRIMETH/SULFA >=320 RESISTANT Resistant     AMPICILLIN/SULBACTAM 16 INTERMEDIATE Intermediate     PIP/TAZO <=4 SENSITIVE Sensitive     * 20,000 COLONIES/mL ESCHERICHIA COLI     Radiology Studies: No results found.  Marzetta Board, MD, PhD [facility]  Between 7 am - 7 pm I am available, please contact me via Amion or Securechat  Between 7 pm - 7 am I am not available, please contact night coverage MD/APP via Amion

## 2020-02-25 NOTE — Progress Notes (Signed)
Patient ID: Joann Gutierrez, female   DOB: [birth year], 75 y.o.   MRN: [id number] [facility] Progress Note   Assessment/ Plan:   1.  New end-stage renal dialysis: Following acute kidney injury on chronic kidney disease stage V in the setting of COVID-19 infection.  Started on hemodialysis for uremic signs and symptoms along with volume overload.  She has tolerated dialysis well so far-last treatment done yesterday and her mental status appears to be back to baseline today.  AV fistula infiltrated when cannulation attempted earlier and will need evaluation as an outpatient.  Begin process for outpatient dialysis unit placement. 2.  Hypoxic respiratory failure: Secondary to COVID-19 infection.  On treatment with remdesivir and corticosteroids with clinical improvement and decreased oxygenation requirements.  She is on oxygen at baseline for COPD. 3.  Metabolic encephalopathy: Appears to have been multifactorial and improved with ongoing treatment of underlying infection and hemodialysis. 4.  Anemia: Secondary to underlying chronic kidney disease and acute/critical illness.  Continue to monitor on ESA. 5.  Secondary hyperparathyroidism: Phosphorus level appears to be controlled with dialysis, PTH level at goal.  Subjective:   Reports that she feels much better today "I woke up feeling like a new woman".  Denies any chest pain or shortness of breath.  Oriented to time, place, person and situation.   Objective:   BP 131/77 (BP Location: Right Arm)   Pulse 66   Temp 98.1 F (36.7 C) (Axillary)   Resp 20   Ht 5\' 7"  (1.702 m)   Wt 114.4 kg   SpO2 93%   BMI 39.50 kg/m   Intake/Output Summary (Last 24 hours) at 02/25/2020 0911 Last data filed at 02/25/2020 0800 Gross per 24 hour  Intake 360 ml  Output -  Net 360 ml   Weight change: -0.6 kg  Physical Exam: Gen: Sitting up comfortably in recliner CVS: Pulse regular rhythm, normal rate, S1 and S2 normal Resp: Diminished breath  sounds over bases with coarse/transmitted breath sounds upper lung fields.  Right IJ TDC in place. Abd: Soft, obese, nontender, bowel sounds normal Ext: Right upper extremity with infiltrated fistula.  Asymmetric pedal edema afflicting left leg.  Imaging: No results found.  Labs: BMET Recent Labs  Lab 02/19/20 0104 02/20/20 0315 02/21/20 0124 02/23/20 0234 02/24/20 0150 02/25/20 0200  NA 138 139 140 136 137 136  K 3.8 4.0 4.6 3.9 3.7 3.6  CL 104 104 105 100 99 98  CO2 20* 20* 19* 22 23 24   GLUCOSE 221* 134* 203* 308* 174* 168*  BUN 77* 91* 111* 72* 35* 46*  CREATININE 6.34* 8.20* 9.72* 8.89* 5.75* 7.06*  CALCIUM 7.5* 7.7* 7.6* 7.5* 7.1* 7.6*   CBC Recent Labs  Lab 02/19/20 0104 02/20/20 0315 02/21/20 0124 02/23/20 0234 02/24/20 0150 02/25/20 0200  WBC 5.8 6.8 10.4 12.5* 12.4* 15.6*  NEUTROABS 4.0 5.3 5.9  --   --   --   HGB 8.3* 8.4* 7.8* 7.8* 7.3* 7.0*  HCT 26.2* 26.1* 25.9* 24.5* 23.7* 22.1*  MCV 65.0* 64.3* 67.4* 66.0* 66.4* 65.8*  PLT 135* 167 179 216 207 206    Medications:    . amLODipine  10 mg Oral QPM  . vitamin C  500 mg Oral Daily  . aspirin EC  81 mg Oral Daily  . carvedilol  25 mg Oral BID  . Chlorhexidine Gluconate Cloth  6 each Topical Q0600  . cholecalciferol  1,000 Units Oral Daily  . darbepoetin (ARANESP) injection - DIALYSIS  150 mcg Intravenous Q Fri-HD  . donepezil  5 mg Oral QHS  . heparin injection (subcutaneous)  7,500 Units Subcutaneous Q8H  . hydrALAZINE  100 mg Oral TID  . insulin aspart  0-5 Units Subcutaneous QHS  . insulin aspart  0-6 Units Subcutaneous TID WC  . insulin detemir  14 Units Subcutaneous Q2200  . predniSONE  20 mg Oral Daily  . zinc sulfate  220 mg Oral Daily   [name], MD 02/25/2020, 9:11 AM

## 2020-02-25 NOTE — TOC Progression Note (Signed)
Transition of Care [facility]) - Progression Note    Patient Details  Name: Joann Gutierrez MRN: [id number] Date of Birth: December 24, 1945  Transition of Care [facility]) CM/SW [place], LCSW Phone Number: 02/25/2020, 8:47 AM  Clinical Narrative:    CSW received return call from patient's son, [name]. [name] reported that he is patient's biological son and that [name] is patient's step-daughter, though patient raised her. He stated that his mother told him that he wanted her sister to be the point of contact. CSW explained that legally, since patient is disoriented, we will continue to update patient's son. He stated that they are hopeful to get patient to [facility], since patient has been there before and was able to walk once discharged from there. CSW will continue to follow once out patient is out of her COVID isolation as current [place] facilities are not accepting dialysis patients.    Expected Discharge Plan: [place] Barriers to Discharge: Continued Medical Work up  Expected Discharge Plan and Services Expected Discharge Plan: [place] In-house Referral: Clinical Social Work   Post Acute Care Choice: [place] Living arrangements for the past 2 months: Single Family Home                                       Social Determinants of Health (SDOH) Interventions    Readmission Risk Interventions No flowsheet data found.

## 2020-02-25 NOTE — Plan of Care (Signed)
  Problem: Clinical Measurements: Goal: Ability to maintain clinical measurements within normal limits will improve Outcome: Progressing Goal: Will remain free from infection Outcome: Progressing Goal: Diagnostic test results will improve Outcome: Progressing Goal: Respiratory complications will improve Outcome: Progressing   Problem: Activity: Goal: Risk for activity intolerance will decrease Outcome: Progressing   Problem: Nutrition: Goal: Adequate nutrition will be maintained Outcome: Progressing   Problem: Coping: Goal: Level of anxiety will decrease Outcome: Progressing   Problem: Pain Managment: Goal: General experience of comfort will improve Outcome: Progressing   Problem: Safety: Goal: Ability to remain free from injury will improve Outcome: Progressing   Problem: Skin Integrity: Goal: Risk for impaired skin integrity will decrease Outcome: Progressing   Problem: Education: Goal: Knowledge of risk factors and measures for prevention of condition will improve Outcome: Progressing   Problem: Respiratory: Goal: Will maintain a patent airway Outcome: Progressing Goal: Complications related to the disease process, condition or treatment will be avoided or minimized Outcome: Progressing   Problem: Education: Goal: Knowledge of disease and its progression will improve Outcome: Progressing Goal: Individualized Educational Video(s) Outcome: Progressing   Problem: Fluid Volume: Goal: Compliance with measures to maintain balanced fluid volume will improve Outcome: Progressing   Problem: Nutritional: Goal: Ability to make healthy dietary choices will improve Outcome: Progressing

## 2020-02-26 DIAGNOSIS — U071 COVID-19: Secondary | ICD-10-CM | POA: Diagnosis not present

## 2020-02-26 DIAGNOSIS — J1282 Pneumonia due to coronavirus disease 2019: Secondary | ICD-10-CM | POA: Diagnosis not present

## 2020-02-26 LAB — CBC
HCT: 23.8 % — ABNORMAL LOW (ref 36.0–46.0)
Hemoglobin: 7 g/dL — ABNORMAL LOW (ref 12.0–15.0)
MCH: 20.1 pg — ABNORMAL LOW (ref 26.0–34.0)
MCHC: 29.4 g/dL — ABNORMAL LOW (ref 30.0–36.0)
MCV: 68.4 fL — ABNORMAL LOW (ref 80.0–100.0)
Platelets: 196 10*3/uL (ref 150–400)
RBC: 3.48 MIL/uL — ABNORMAL LOW (ref 3.87–5.11)
RDW: 21.6 % — ABNORMAL HIGH (ref 11.5–15.5)
WBC: 25.3 10*3/uL — ABNORMAL HIGH (ref 4.0–10.5)
nRBC: 0.7 % — ABNORMAL HIGH (ref 0.0–0.2)

## 2020-02-26 LAB — GLUCOSE, CAPILLARY
Glucose-Capillary: 202 mg/dL — ABNORMAL HIGH (ref 70–99)
Glucose-Capillary: 141 mg/dL — ABNORMAL HIGH (ref 70–99)
Glucose-Capillary: 231 mg/dL — ABNORMAL HIGH (ref 70–99)
Glucose-Capillary: 220 mg/dL — ABNORMAL HIGH (ref 70–99)

## 2020-02-26 LAB — RENAL FUNCTION PANEL
Albumin: 2.3 g/dL — ABNORMAL LOW (ref 3.5–5.0)
Anion gap: 13 (ref 5–15)
BUN: 31 mg/dL — ABNORMAL HIGH (ref 8–23)
CO2: 24 mmol/L (ref 22–32)
Calcium: 7.3 mg/dL — ABNORMAL LOW (ref 8.9–10.3)
Chloride: 98 mmol/L (ref 98–111)
Creatinine, Ser: 5.97 mg/dL — ABNORMAL HIGH (ref 0.44–1.00)
GFR, Estimated: 7 mL/min — ABNORMAL LOW (ref >60)
Glucose, Bld: 239 mg/dL — ABNORMAL HIGH (ref 70–99)
Phosphorus: 3.2 mg/dL (ref 2.5–4.6)
Potassium: 3.5 mmol/L (ref 3.5–5.1)
Sodium: 135 mmol/L (ref 135–145)

## 2020-02-26 LAB — ABO/RH: ABO/RH(D): O POS

## 2020-02-26 LAB — C-REACTIVE PROTEIN: CRP: 1.6 mg/dL — ABNORMAL HIGH (ref <1.0)

## 2020-02-26 LAB — D-DIMER, QUANTITATIVE: D-Dimer, Quant: 2.98 ug/mL-FEU — ABNORMAL HIGH (ref 0.00–0.50)

## 2020-02-26 LAB — PREPARE RBC (CROSSMATCH)

## 2020-02-26 MED ORDER — SODIUM CHLORIDE 0.9% IV SOLUTION
Freq: Once | INTRAVENOUS | Status: AC
  Administered 2020-02-26: 250 mL via INTRAVENOUS

## 2020-02-26 NOTE — Progress Notes (Signed)
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  On arrival she was satting about 70%.  She initially required BiPAP and was transitioned to 5 L.  She was febrile to 101.8, and Covid was found to be positive  Subjective / 24h Interval events: No shortness of breath, no chest pain, no nausea or vomiting.  Assessment & Plan: Principal Problem Acute on chronic hypoxic respiratory failure due to COVID-19 viral pneumonia-status post Remdesivir, on steroids, decrease dose to 20 mg and do 3 additional days as a quick taper, 2 days remaining -Initially on admission due to elevated procalcitonin she has been on ceftriaxone azithromycin for 7 days, currently monitor off antibiotics -Respiratory status stable, currently on 2 L nasal cannula -There was concern for DVT due to left lower extremity edema and elevated D-dimer, but Doppler was negative -Patient needs 21 days of isolation from 1/25 when she first tested positive -Needs SNF, placement pending  Active Problems Acute kidney injury on chronic kidney disease stage V, metabolic acidosis, now end-stage-nephrology consulted, had a temporary dialysis cath placed and she is now dialysis dependent with plans to arrange outpatient hemodialysis.  Lasix, spironolactone, losartan on hold  Acute metabolic encephalopathy, underlying dementia -Appears improved, due to Covid -resume home Aricept -Her encephalopathy seems a lot better, she is able to converse without difficulties, has some memory issues which are obvious but thought process is appropriate  Insulin-dependent diabetes mellitus -A1c 7.4,  continue sliding scale  CBG (last 3)  Recent Labs    02/25/20 1657 02/25/20 2129 02/26/20 0758  GLUCAP 189* 310* 202*   Thrombocytopenia -Due to chronic renal disease, COVID-19.  Now normalized.  COPD -Stable, no wheezing  Anemia due to chronic kidney disease -No bleeding, hemoglobin 7.0, monitor  Scheduled Meds: . amLODipine  10 mg Oral QPM  . vitamin C  500 mg Oral Daily  . aspirin EC  81 mg Oral Daily  . carvedilol  25 mg Oral BID  . Chlorhexidine Gluconate Cloth  6 each Topical Q0600  . cholecalciferol  1,000 Units Oral Daily  . darbepoetin (ARANESP) injection - DIALYSIS  150 mcg Intravenous Q Fri-HD  . donepezil  5 mg Oral QHS  . heparin injection (subcutaneous)  7,500 Units Subcutaneous Q8H  . hydrALAZINE  100 mg Oral TID  . insulin aspart  0-5 Units Subcutaneous QHS  . insulin aspart  0-6 Units Subcutaneous TID WC  . insulin detemir  14 Units Subcutaneous Q2200  . predniSONE  20 mg Oral Daily  . zinc sulfate  220 mg Oral Daily   Continuous Infusions: PRN Meds:.acetaminophen, guaiFENesin-dextromethorphan, Ipratropium-Albuterol, lip balm, ondansetron (ZOFRAN) IV  Diet Orders (From admission, onward)    Start     Ordered   02/20/20 0802  Diet renal/carb modified with fluid restriction Diet-HS Snack? Nothing; Fluid restriction: 1200 mL Fluid; Room service appropriate? No; Fluid consistency: Thin  Diet effective now       Question Answer Comment  Diet-HS Snack? Nothing   Fluid restriction: 1200 mL Fluid   Room service appropriate? No   Fluid consistency: Thin      02/20/20 0801          DVT prophylaxis:  Code Status: Full Code  Family Communication: no family at bedside, updated daughter over the phone  Status is: Inpatient  Remains inpatient appropriate because:Inpatient level of care appropriate due to severity of illness   Dispo: The patient is from: Home              Anticipated d/c is to: SNF              Anticipated d/c date is: 2 days               Patient currently is not medically stable to d/c.   Difficult to place patient No  Level of care: Telemetry Medical  Consultants:  Nephrology  IR  Procedures:  none  Microbiology  none  Antimicrobials: none    Objective: Vitals:   02/25/20 1558 02/25/20 1624 02/25/20 2200 02/26/20 0532  BP: (P) 122/63 (!) 108/56  (!) 123/52  Pulse: (P) 65 69  80  Resp: (P) 18 16 20 18   Temp: (!) (P) 97.5 F (36.4 C) (!) 96.4 F (35.8 C)  98.6 F (37 C)  TempSrc: (P) Oral Oral  Oral  SpO2: (P) 98% 98%  96%  Weight: (P) 114 kg     Height:        Intake/Output Summary (Last 24 hours) at 02/26/2020 1135 Last data filed at 02/26/2020 0943 Gross per 24 hour  Intake 60 ml  Output 1500 ml  Net -1440 ml   Filed Weights   02/25/20 0500 02/25/20 1310 02/25/20 1558  Weight: 114.4 kg 115.4 kg (P) 114 kg    Examination:  Constitutional: NAD, in bed Respiratory: CTA bilaterally Cardiovascular: Regular rate and rhythm, no murmurs  Data Reviewed: I have independently reviewed following labs and imaging studies   CBC: Recent Labs  Lab 02/20/20 0315 02/21/20 0124 02/23/20 0234 02/24/20 0150 02/25/20 0200 02/26/20 0500  WBC 6.8 10.4 12.5* 12.4* 15.6* 25.3*  NEUTROABS 5.3 5.9  --   --   --   --   HGB 8.4* 7.8* 7.8* 7.3* 7.0* 7.0*  HCT 26.1* 25.9* 24.5* 23.7* 22.1* 23.8*  MCV 64.3* 67.4* 66.0* 66.4* 65.8* 68.4*  PLT 167 179 216 207 206 409   Basic Metabolic Panel: Recent Labs  Lab 02/21/20 0124 02/23/20 0234 02/24/20 0150 02/25/20 0200 02/26/20 0500  NA 140 136 137 136 135  K 4.6 3.9 3.7 3.6 3.5  CL 105 100 99 98 98  CO2 19* 22 23 24 24   GLUCOSE 203* 308* 174* 168* 239*  BUN 111* 72* 35* 46* 31*  CREATININE 9.72* 8.89* 5.75* 7.06* 5.97*  CALCIUM 7.6* 7.5* 7.1* 7.6* 7.3*  PHOS  --   --   --   --  3.2   Liver Function Tests: Recent Labs  Lab 02/20/20 0315 02/21/20 0124 02/23/20 0234 02/26/20 0500  AST 20 24 26   --   ALT 8 5 7   --   ALKPHOS 59 62 69  --    BILITOT 0.6 0.3 0.7  --   PROT 6.2* 6.1* 6.1*  --   ALBUMIN 2.4* 2.4* 2.4* 2.3*   Coagulation Profile: No results for input(s): INR, PROTIME in the last 168 hours. HbA1C: No results for input(s): HGBA1C in the last 72 hours. CBG: Recent Labs  Lab 02/25/20 0754 02/25/20 1213 02/25/20 1657 02/25/20 2129 02/26/20 0758  GLUCAP 112* 225* 189* 310* 202*    No results found for this or any previous visit (from the past 240 hour(s)).   Radiology Studies: No results  found.  Marzetta Board, MD, PhD [facility]  Between 7 am - 7 pm I am available, please contact me via Amion or Securechat  Between 7 pm - 7 am I am not available, please contact night coverage MD/APP via Amion

## 2020-02-26 NOTE — Progress Notes (Signed)
Patient ID: Joann Gutierrez, female   DOB: 08-01-1945, 75 y.o.   MRN: [id number] [facility] Progress Note   Assessment/ Plan:   1.  New end-stage renal dialysis: Following acute kidney injury on chronic kidney disease stage V in the setting of COVID-19 infection.  Started on hemodialysis for uremic signs and symptoms along with volume overload-underwent dialysis yesterday and next one due Monday.  She continues to tolerate hemodialysis well and mental status has improved now back to baseline.  Process underway for placement to a skilled nursing facility as well as outpatient dialysis unit placement. 2.  Hypoxic respiratory failure: Secondary to COVID-19 infection.  On treatment with remdesivir and corticosteroids with clinical improvement and decreased oxygenation requirements.  She is on oxygen at baseline for COPD. 3.  Metabolic encephalopathy: Appears to have been multifactorial and improved with ongoing treatment of underlying infection and hemodialysis. 4.  Anemia: Secondary to underlying chronic kidney disease and acute/critical illness.  Continue to monitor on ESA. 5.  Secondary hyperparathyroidism: Phosphorus level appears to be controlled with dialysis, PTH level at goal.  Subjective:   With out any acute events overnight, tolerated dialysis without problems and continues to feel better.   Objective:   BP (!) 115/49 (BP Location: Left Arm)   Pulse 76   Temp (!) 97.5 F (36.4 C) (Oral)   Resp 19   Ht 5\' 7"  (1.702 m)   Wt (P) 114 kg   SpO2 95%   BMI (P) 39.36 kg/m   Intake/Output Summary (Last 24 hours) at 02/26/2020 1246 Last data filed at 02/26/2020 9622 Gross per 24 hour  Intake 60 ml  Output 1500 ml  Net -1440 ml   Weight change: 1 kg  Physical Exam: Patient not personally examined to preserve PPE/limit contact with COVID-19; discussed pertinent elements of physical exam with Dr. Cruzita Lederer  Imaging: No results found.  Labs: BMET Recent Labs  Lab  02/20/20 0315 02/21/20 0124 02/23/20 0234 02/24/20 0150 02/25/20 0200 02/26/20 0500  NA 139 140 136 137 136 135  K 4.0 4.6 3.9 3.7 3.6 3.5  CL 104 105 100 99 98 98  CO2 20* 19* 22 23 24 24   GLUCOSE 134* 203* 308* 174* 168* 239*  BUN 91* 111* 72* 35* 46* 31*  CREATININE 8.20* 9.72* 8.89* 5.75* 7.06* 5.97*  CALCIUM 7.7* 7.6* 7.5* 7.1* 7.6* 7.3*  PHOS  --   --   --   --   --  3.2   CBC Recent Labs  Lab 02/20/20 0315 02/21/20 0124 02/23/20 0234 02/24/20 0150 02/25/20 0200 02/26/20 0500  WBC 6.8 10.4 12.5* 12.4* 15.6* 25.3*  NEUTROABS 5.3 5.9  --   --   --   --   HGB 8.4* 7.8* 7.8* 7.3* 7.0* 7.0*  HCT 26.1* 25.9* 24.5* 23.7* 22.1* 23.8*  MCV 64.3* 67.4* 66.0* 66.4* 65.8* 68.4*  PLT 167 179 216 207 206 196    Medications:    . amLODipine  10 mg Oral QPM  . vitamin C  500 mg Oral Daily  . aspirin EC  81 mg Oral Daily  . carvedilol  25 mg Oral BID  . Chlorhexidine Gluconate Cloth  6 each Topical Q0600  . cholecalciferol  1,000 Units Oral Daily  . darbepoetin (ARANESP) injection - DIALYSIS  150 mcg Intravenous Q Fri-HD  . donepezil  5 mg Oral QHS  . heparin injection (subcutaneous)  7,500 Units Subcutaneous Q8H  . hydrALAZINE  100 mg Oral TID  . insulin  aspart  0-5 Units Subcutaneous QHS  . insulin aspart  0-6 Units Subcutaneous TID WC  . insulin detemir  14 Units Subcutaneous Q2200  . predniSONE  20 mg Oral Daily  . zinc sulfate  220 mg Oral Daily   [name], MD 02/26/2020, 12:46 PM

## 2020-02-26 NOTE — Plan of Care (Signed)
  Problem: Clinical Measurements: Goal: Ability to maintain clinical measurements within normal limits will improve Outcome: Progressing Goal: Will remain free from infection Outcome: Progressing Goal: Diagnostic test results will improve Outcome: Progressing Goal: Respiratory complications will improve Outcome: Progressing Goal: Cardiovascular complication will be avoided Outcome: Progressing   Problem: Coping: Goal: Level of anxiety will decrease Outcome: Progressing   Problem: Elimination: Goal: Will not experience complications related to bowel motility Outcome: Progressing Goal: Will not experience complications related to urinary retention Outcome: Progressing   Problem: Coping: Goal: Psychosocial and spiritual needs will be supported Outcome: Progressing   Problem: Respiratory: Goal: Will maintain a patent airway Outcome: Progressing Goal: Complications related to the disease process, condition or treatment will be avoided or minimized Outcome: Progressing   Problem: Education: Goal: Knowledge of General Education information will improve Description: Including pain rating scale, medication(s)/side effects and non-pharmacologic comfort measures Outcome: Not Progressing   Problem: Health Behavior/Discharge Planning: Goal: Ability to manage health-related needs will improve Outcome: Not Progressing   Problem: Activity: Goal: Risk for activity intolerance will decrease Outcome: Not Progressing   Problem: Nutrition: Goal: Adequate nutrition will be maintained Outcome: Not Progressing   Problem: Education: Goal: Knowledge of risk factors and measures for prevention of condition will improve Outcome: Not Progressing

## 2020-02-27 DIAGNOSIS — J1282 Pneumonia due to coronavirus disease 2019: Secondary | ICD-10-CM | POA: Diagnosis not present

## 2020-02-27 DIAGNOSIS — U071 COVID-19: Secondary | ICD-10-CM | POA: Diagnosis not present

## 2020-02-27 LAB — COMPREHENSIVE METABOLIC PANEL
ALT: 10 U/L (ref 0–44)
AST: 23 U/L (ref 15–41)
Albumin: 2.5 g/dL — ABNORMAL LOW (ref 3.5–5.0)
Alkaline Phosphatase: 68 U/L (ref 38–126)
Anion gap: 13 (ref 5–15)
BUN: 41 mg/dL — ABNORMAL HIGH (ref 8–23)
CO2: 22 mmol/L (ref 22–32)
Calcium: 7.7 mg/dL — ABNORMAL LOW (ref 8.9–10.3)
Chloride: 96 mmol/L — ABNORMAL LOW (ref 98–111)
Creatinine, Ser: 7.3 mg/dL — ABNORMAL HIGH (ref 0.44–1.00)
GFR, Estimated: 5 mL/min — ABNORMAL LOW (ref >60)
Glucose, Bld: 145 mg/dL — ABNORMAL HIGH (ref 70–99)
Potassium: 3.9 mmol/L (ref 3.5–5.1)
Sodium: 131 mmol/L — ABNORMAL LOW (ref 135–145)
Total Bilirubin: 0.7 mg/dL (ref 0.3–1.2)
Total Protein: 5.9 g/dL — ABNORMAL LOW (ref 6.5–8.1)

## 2020-02-27 LAB — TYPE AND SCREEN
ABO/RH(D): O POS
Antibody Screen: NEGATIVE
Unit division: 0

## 2020-02-27 LAB — CBC
HCT: 30.6 % — ABNORMAL LOW (ref 36.0–46.0)
Hemoglobin: 9.1 g/dL — ABNORMAL LOW (ref 12.0–15.0)
MCH: 21.5 pg — ABNORMAL LOW (ref 26.0–34.0)
MCHC: 29.7 g/dL — ABNORMAL LOW (ref 30.0–36.0)
MCV: 72.3 fL — ABNORMAL LOW (ref 80.0–100.0)
Platelets: 155 10*3/uL (ref 150–400)
RBC: 4.23 MIL/uL (ref 3.87–5.11)
RDW: 24 % — ABNORMAL HIGH (ref 11.5–15.5)
WBC: 23.2 10*3/uL — ABNORMAL HIGH (ref 4.0–10.5)
nRBC: 0.6 % — ABNORMAL HIGH (ref 0.0–0.2)

## 2020-02-27 LAB — BPAM RBC
Blood Product Expiration Date: 202203082359
ISSUE DATE / TIME: 202202051546
Unit Type and Rh: 5100

## 2020-02-27 LAB — GLUCOSE, CAPILLARY
Glucose-Capillary: 146 mg/dL — ABNORMAL HIGH (ref 70–99)
Glucose-Capillary: 161 mg/dL — ABNORMAL HIGH (ref 70–99)
Glucose-Capillary: 239 mg/dL — ABNORMAL HIGH (ref 70–99)
Glucose-Capillary: 295 mg/dL — ABNORMAL HIGH (ref 70–99)

## 2020-02-27 LAB — TROPONIN I (HIGH SENSITIVITY): Troponin I (High Sensitivity): 17 ng/L (ref <18)

## 2020-02-27 MED ORDER — TRAMADOL HCL 50 MG PO TABS
25.0000 mg | ORAL_TABLET | Freq: Once | ORAL | Status: AC | PRN
Start: 2020-02-27 — End: 2020-02-27
  Administered 2020-02-27: 25 mg via ORAL
  Filled 2020-02-27: qty 1

## 2020-02-27 NOTE — Progress Notes (Signed)
PROGRESS NOTE  Joann Gutierrez ZOX:[id number] DOB: [birth year] DOA: 02/15/2020 PCP: Andree Moro, DO   LOS: 12 days   Brief Narrative / Interim history: 75 year old female with dementia, asthma, COPD, chronic hypoxia with 2 L home oxygen, CAD, chronic kidney disease stage V with AV graft in place, diabetes mellitus, hypertension, hyperlipidemia, obesity came into the hospital with shortness of breath.  On arrival she was satting about 70%.  She initially required BiPAP and was transitioned to 5 L.  She was febrile to 101.8, and Covid was found to be positive  Subjective / 24h Interval events: No complaints today, feels well  Assessment & Plan: Principal Problem Acute on chronic hypoxic respiratory failure due to COVID-19 viral pneumonia-status post Remdesivir, on steroids, decrease dose to 20 mg and do 3 additional days as a quick taper, last dose of steroids today -Initially on admission due to elevated procalcitonin she has been on ceftriaxone azithromycin for 7 days, currently monitor off antibiotics -Respiratory status stable, currently on 2 L nasal cannula -There was concern for DVT due to left lower extremity edema and elevated D-dimer, but Doppler was negative -Patient needs 21 days of isolation from 1/25 when she first tested positive -Needs SNF, placement pending  Active Problems Acute kidney injury on chronic kidney disease stage V, metabolic acidosis, now end-stage-nephrology consulted, had a temporary dialysis cath placed and she is now dialysis dependent with plans to arrange outpatient hemodialysis.  Lasix, spironolactone, losartan on hold  Leukocytosis -Yesterday and today patient's WBC is slightly higher.  This is possibly due to steroids, clinically she is stable.  She is afebrile.  No respiratory symptoms other than #1.  Continue to monitor  Acute metabolic encephalopathy, underlying dementia -Appears improved, due to Covid -resume home Aricept -Her encephalopathy  seems a lot better, she is able to converse without difficulties, has some memory issues which are obvious but thought process is appropriate  Insulin-dependent diabetes mellitus -A1c 7.4, continue sliding scale  CBG (last 3)  Recent Labs    02/26/20 1758 02/26/20 2058 02/27/20 0747  GLUCAP 231* 220* 146*   Thrombocytopenia -Due to chronic renal disease, COVID-19.  Now normalized.  COPD -Stable, no wheezing  Anemia due to chronic kidney disease -No bleeding, hemoglobin was 7 on 2/5, she was transfused unit of packed red blood cells and improved to 9.1 this morning  Scheduled Meds: . amLODipine  10 mg Oral QPM  . vitamin C  500 mg Oral Daily  . aspirin EC  81 mg Oral Daily  . carvedilol  25 mg Oral BID  . Chlorhexidine Gluconate Cloth  6 each Topical Q0600  . cholecalciferol  1,000 Units Oral Daily  . darbepoetin (ARANESP) injection - DIALYSIS  150 mcg Intravenous Q Fri-HD  . donepezil  5 mg Oral QHS  . heparin injection (subcutaneous)  7,500 Units Subcutaneous Q8H  . hydrALAZINE  100 mg Oral TID  . insulin aspart  0-5 Units Subcutaneous QHS  . insulin aspart  0-6 Units Subcutaneous TID WC  . insulin detemir  14 Units Subcutaneous Q2200  . zinc sulfate  220 mg Oral Daily   Continuous Infusions: PRN Meds:.acetaminophen, guaiFENesin-dextromethorphan, Ipratropium-Albuterol, lip balm, ondansetron (ZOFRAN) IV  Diet Orders (From admission, onward)    Start     Ordered   02/20/20 0802  Diet renal/carb modified with fluid restriction Diet-HS Snack? Nothing; Fluid restriction: 1200 mL Fluid; Room service appropriate? No; Fluid consistency: Thin  Diet effective now       Question Answer  Comment  Diet-HS Snack? Nothing   Fluid restriction: 1200 mL Fluid   Room service appropriate? No   Fluid consistency: Thin      02/20/20 0801          DVT prophylaxis:      Code Status: Full Code  Family Communication: no family at bedside, updated daughter over the phone  Status  is: Inpatient  Remains inpatient appropriate because:Inpatient level of care appropriate due to severity of illness   Dispo: The patient is from: Home              Anticipated d/c is to: SNF              Anticipated d/c date is: 2 days              Patient currently is not medically stable to d/c.   Difficult to place patient No  Level of care: Telemetry Medical  Consultants:  Nephrology  IR  Procedures:  none  Microbiology  none  Antimicrobials: none    Objective: Vitals:   02/26/20 1610 02/26/20 1917 02/26/20 2020 02/27/20 0440  BP: 118/64 (!) 148/72 (!) 142/72 (!) 131/59  Pulse: 72 69 74 74  Resp: 19 18 18 18   Temp: 98 F (36.7 C) 98 F (36.7 C) 98.2 F (36.8 C) 98.3 F (36.8 C)  TempSrc: Oral Oral Oral Oral  SpO2: 99% 98% 98% 92%  Weight:      Height:        Intake/Output Summary (Last 24 hours) at 02/27/2020 1128 Last data filed at 02/27/2020 0911 Gross per 24 hour  Intake 614 ml  Output -  Net 614 ml   Filed Weights   02/25/20 0500 02/25/20 1310 02/25/20 1558  Weight: 114.4 kg 115.4 kg (P) 114 kg    Examination:  Constitutional: NAD, calm, comfortable Eyes:  lids and conjunctivae normal ENMT: Mucous membranes are moist.  Neck: normal, supple Respiratory: clear to auscultation bilaterally, no wheezing, no crackles. Normal respiratory effort.  Cardiovascular: Regular rate and rhythm, no murmurs / rubs / gallops. No LE edema. Abdomen: no tenderness. Bowel sounds positive.  Skin: no rashes, lesions, ulcers. No induration Neurologic: non focal  Data Reviewed: I have independently reviewed following labs and imaging studies   CBC: Recent Labs  Lab 02/21/20 0124 02/23/20 0234 02/24/20 0150 02/25/20 0200 02/26/20 0500 02/27/20 0923  WBC 10.4 12.5* 12.4* 15.6* 25.3* 23.2*  NEUTROABS 5.9  --   --   --   --   --   HGB 7.8* 7.8* 7.3* 7.0* 7.0* 9.1*  HCT 25.9* 24.5* 23.7* 22.1* 23.8* 30.6*  MCV 67.4* 66.0* 66.4* 65.8* 68.4* 72.3*  PLT 179 216  207 206 196 973   Basic Metabolic Panel: Recent Labs  Lab 02/23/20 0234 02/24/20 0150 02/25/20 0200 02/26/20 0500 02/27/20 0923  NA 136 137 136 135 131*  K 3.9 3.7 3.6 3.5 3.9  CL 100 99 98 98 96*  CO2 22 23 24 24 22   GLUCOSE 308* 174* 168* 239* 145*  BUN 72* 35* 46* 31* 41*  CREATININE 8.89* 5.75* 7.06* 5.97* 7.30*  CALCIUM 7.5* 7.1* 7.6* 7.3* 7.7*  PHOS  --   --   --  3.2  --    Liver Function Tests: Recent Labs  Lab 02/21/20 0124 02/23/20 0234 02/26/20 0500 02/27/20 0923  AST 24 26  --  23  ALT 5 7  --  10  ALKPHOS 62 69  --  68  BILITOT 0.3  0.7  --  0.7  PROT 6.1* 6.1*  --  5.9*  ALBUMIN 2.4* 2.4* 2.3* 2.5*   Coagulation Profile: No results for input(s): INR, PROTIME in the last 168 hours. HbA1C: No results for input(s): HGBA1C in the last 72 hours. CBG: Recent Labs  Lab 02/26/20 0758 02/26/20 1219 02/26/20 1758 02/26/20 2058 02/27/20 0747  GLUCAP 202* 141* 231* 220* 146*    No results found for this or any previous visit (from the past 240 hour(s)).   Radiology Studies: No results found.  Marzetta Board, MD, PhD [facility]  Between 7 am - 7 pm I am available, please contact me via Amion or Securechat  Between 7 pm - 7 am I am not available, please contact night coverage MD/APP via Amion

## 2020-02-27 NOTE — Progress Notes (Signed)
Pt refused CPAP tonight she said she did fine without it last night. RN aware.

## 2020-02-27 NOTE — Progress Notes (Signed)
Patient ID: Joann Gutierrez, female   DOB: 02-Dec-1945, 75 y.o.   MRN: [id number] [facility] Progress Note   Assessment/ Plan:   1.  New end-stage renal dialysis: Following acute kidney injury on chronic kidney disease stage V in the setting of COVID-19 infection.  Started on hemodialysis for uremic signs and symptoms along with volume overload.  She has tolerated dialysis well so far we will continue her on a Monday/Wednesday/Friday schedule while here with next hemodialysis tomorrow.  Her right upper arm AV fistula infiltrated when cannulation attempted earlier and will need evaluation as an outpatient.  The process was initiated for outpatient dialysis unit placement. 2.  Hypoxic respiratory failure: Secondary to COVID-19 infection.  On treatment with remdesivir and corticosteroids with clinical improvement and decreased oxygenation requirements.  She is on oxygen at baseline for COPD. 3.  Metabolic encephalopathy: Likely multifactorial from underlying infection/uremia-improving with dialysis 4.  Anemia: Secondary to underlying chronic kidney disease and acute/critical illness.  Continue to monitor on ESA. 5.  Secondary hyperparathyroidism: Phosphorus level appears to be controlled with dialysis, PTH level at goal.  Subjective:   Reports that she is feeling better and this is further evidenced when she asks when she can leave the hospital and what the plan is for outpatient dialysis.   Objective:   BP (!) 118/58 (BP Location: Left Arm)   Pulse 61   Temp 97.7 F (36.5 C) (Oral)   Resp 17   Ht 5\' 7"  (1.702 m)   Wt (P) 114 kg   SpO2 95%   BMI (P) 39.36 kg/m   Intake/Output Summary (Last 24 hours) at 02/27/2020 1217 Last data filed at 02/27/2020 0911 Gross per 24 hour  Intake 614 ml  Output -  Net 614 ml   Weight change:   Physical Exam: Gen: Sting comfortably in recliner CVS: Pulse regular rhythm, normal rate, S1 and S2 normal Resp: Coarse/transmitted breath sounds  bilaterally without rales/rhonchi.  Right IJ TDC in place. Abd: Soft, obese, nontender, bowel sounds normal Ext: Right upper extremity with infiltrated fistula.  Asymmetric pedal edema afflicting left leg.  Imaging: No results found.  Labs: BMET Recent Labs  Lab 02/21/20 0124 02/23/20 0234 02/24/20 0150 02/25/20 0200 02/26/20 0500 02/27/20 0923  NA 140 136 137 136 135 131*  K 4.6 3.9 3.7 3.6 3.5 3.9  CL 105 100 99 98 98 96*  CO2 19* 22 23 24 24 22   GLUCOSE 203* 308* 174* 168* 239* 145*  BUN 111* 72* 35* 46* 31* 41*  CREATININE 9.72* 8.89* 5.75* 7.06* 5.97* 7.30*  CALCIUM 7.6* 7.5* 7.1* 7.6* 7.3* 7.7*  PHOS  --   --   --   --  3.2  --    CBC Recent Labs  Lab 02/21/20 0124 02/23/20 0234 02/24/20 0150 02/25/20 0200 02/26/20 0500 02/27/20 0923  WBC 10.4   < > 12.4* 15.6* 25.3* 23.2*  NEUTROABS 5.9  --   --   --   --   --   HGB 7.8*   < > 7.3* 7.0* 7.0* 9.1*  HCT 25.9*   < > 23.7* 22.1* 23.8* 30.6*  MCV 67.4*   < > 66.4* 65.8* 68.4* 72.3*  PLT 179   < > 207 206 196 155   < > = values in this interval not displayed.    Medications:    . amLODipine  10 mg Oral QPM  . vitamin C  500 mg Oral Daily  . aspirin EC  81  mg Oral Daily  . carvedilol  25 mg Oral BID  . Chlorhexidine Gluconate Cloth  6 each Topical Q0600  . cholecalciferol  1,000 Units Oral Daily  . darbepoetin (ARANESP) injection - DIALYSIS  150 mcg Intravenous Q Fri-HD  . donepezil  5 mg Oral QHS  . heparin injection (subcutaneous)  7,500 Units Subcutaneous Q8H  . hydrALAZINE  100 mg Oral TID  . insulin aspart  0-5 Units Subcutaneous QHS  . insulin aspart  0-6 Units Subcutaneous TID WC  . insulin detemir  14 Units Subcutaneous Q2200  . zinc sulfate  220 mg Oral Daily   [name], MD 02/27/2020, 12:17 PM

## 2020-02-27 NOTE — Progress Notes (Signed)
Patient lost her IV access. Per IV team, they will come to start a new IV only if the patient will need to receive IV medicine, or if there will be an emergency.

## 2020-02-27 NOTE — Significant Event (Addendum)
Pt c/o sudden mid sternum chest pain and describes it as "stabbing" pain. STAT EKG ordered and reveals NSR and normal EKG. Most recent set of VS: BP (!) 143/63 (BP Location: Left Arm)   Pulse 65   Temp 97.8 F (36.6 C) (Oral)   Resp 18   Ht 5\' 7"  (1.702 m)   Wt (P) 114 kg   SpO2 96%   BMI (P) 39.36 kg/m    V. Rathore, MD paged and was ordered PRN tramadol for pain as well as a troponin lab draw. Explained to pt plan of care and pt is agreeable and has no questions or concerns.

## 2020-02-27 NOTE — Plan of Care (Signed)
  Problem: Education: Goal: Knowledge of General Education information will improve Description: Including pain rating scale, medication(s)/side effects and non-pharmacologic comfort measures Outcome: Progressing   Problem: Health Behavior/Discharge Planning: Goal: Ability to manage health-related needs will improve Outcome: Progressing   Problem: Clinical Measurements: Goal: Ability to maintain clinical measurements within normal limits will improve Outcome: Progressing Goal: Will remain free from infection Outcome: Progressing Goal: Diagnostic test results will improve Outcome: Progressing Goal: Respiratory complications will improve Outcome: Progressing Goal: Cardiovascular complication will be avoided Outcome: Progressing   Problem: Nutrition: Goal: Adequate nutrition will be maintained Outcome: Progressing   Problem: Coping: Goal: Level of anxiety will decrease Outcome: Progressing   Problem: Elimination: Goal: Will not experience complications related to bowel motility Outcome: Progressing Goal: Will not experience complications related to urinary retention Outcome: Progressing   Problem: Education: Goal: Knowledge of risk factors and measures for prevention of condition will improve Outcome: Progressing   Problem: Coping: Goal: Psychosocial and spiritual needs will be supported Outcome: Progressing   Problem: Respiratory: Goal: Will maintain a patent airway Outcome: Progressing Goal: Complications related to the disease process, condition or treatment will be avoided or minimized Outcome: Progressing   Problem: Activity: Goal: Risk for activity intolerance will decrease Outcome: Not Progressing

## 2020-02-28 DIAGNOSIS — J1282 Pneumonia due to coronavirus disease 2019: Secondary | ICD-10-CM | POA: Diagnosis not present

## 2020-02-28 DIAGNOSIS — U071 COVID-19: Secondary | ICD-10-CM | POA: Diagnosis not present

## 2020-02-28 LAB — RENAL FUNCTION PANEL
Albumin: 2.5 g/dL — ABNORMAL LOW (ref 3.5–5.0)
Anion gap: 13 (ref 5–15)
BUN: 55 mg/dL — ABNORMAL HIGH (ref 8–23)
CO2: 24 mmol/L (ref 22–32)
Calcium: 7.3 mg/dL — ABNORMAL LOW (ref 8.9–10.3)
Chloride: 92 mmol/L — ABNORMAL LOW (ref 98–111)
Creatinine, Ser: 8.92 mg/dL — ABNORMAL HIGH (ref 0.44–1.00)
GFR, Estimated: 4 mL/min — ABNORMAL LOW (ref >60)
Glucose, Bld: 181 mg/dL — ABNORMAL HIGH (ref 70–99)
Phosphorus: 5.4 mg/dL — ABNORMAL HIGH (ref 2.5–4.6)
Potassium: 3.6 mmol/L (ref 3.5–5.1)
Sodium: 129 mmol/L — ABNORMAL LOW (ref 135–145)

## 2020-02-28 LAB — CBC
HCT: 26.8 % — ABNORMAL LOW (ref 36.0–46.0)
Hemoglobin: 8.5 g/dL — ABNORMAL LOW (ref 12.0–15.0)
MCH: 22.6 pg — ABNORMAL LOW (ref 26.0–34.0)
MCHC: 31.7 g/dL (ref 30.0–36.0)
MCV: 71.3 fL — ABNORMAL LOW (ref 80.0–100.0)
Platelets: 157 10*3/uL (ref 150–400)
RBC: 3.76 MIL/uL — ABNORMAL LOW (ref 3.87–5.11)
RDW: 24.2 % — ABNORMAL HIGH (ref 11.5–15.5)
WBC: 16.5 10*3/uL — ABNORMAL HIGH (ref 4.0–10.5)
nRBC: 1.7 % — ABNORMAL HIGH (ref 0.0–0.2)

## 2020-02-28 LAB — GLUCOSE, CAPILLARY
Glucose-Capillary: 99 mg/dL (ref 70–99)
Glucose-Capillary: 114 mg/dL — ABNORMAL HIGH (ref 70–99)
Glucose-Capillary: 146 mg/dL — ABNORMAL HIGH (ref 70–99)

## 2020-02-28 MED ORDER — HEPARIN SODIUM (PORCINE) 1000 UNIT/ML DIALYSIS
40.0000 [IU]/kg | INTRAMUSCULAR | Status: DC | PRN
Start: 2020-02-28 — End: 2020-02-28
  Filled 2020-02-28: qty 5

## 2020-02-28 MED ORDER — HEPARIN SODIUM (PORCINE) 1000 UNIT/ML IJ SOLN: 1000.0000 [IU] | INTRAMUSCULAR | Status: DC | PRN

## 2020-02-28 MED ORDER — HEPARIN SODIUM (PORCINE) 1000 UNIT/ML DIALYSIS: 1000.0000 [IU] | INTRAMUSCULAR | Status: DC | PRN

## 2020-02-28 MED ORDER — HEPARIN SODIUM (PORCINE) 1000 UNIT/ML IJ SOLN
INTRAMUSCULAR | Status: AC
  Administered 2020-02-28: 3200 [IU] via INTRAVENOUS_CENTRAL
  Filled 2020-02-28: qty 4

## 2020-02-28 MED ORDER — AMLODIPINE BESYLATE 5 MG PO TABS
5.0000 mg | ORAL_TABLET | Freq: Every evening | ORAL | Status: DC
  Administered 2020-02-28 – 2020-03-02 (×3): 5 mg via ORAL
  Filled 2020-02-28 (×3): qty 1

## 2020-02-28 NOTE — Progress Notes (Signed)
Patient referred for outpatient ESRD HD treatment. All records have been submitted to [facility] to request treatment at the clinic closest to patient's home that can accommodate a new patient at this time. Patient is 14 days from positive COVID test as of 02/29/20, so will not require treatment in isolation as long as she is afebrile and not having an increase in symptoms at discharge. Navigator is collaborating with CSW to arrange transportation to/from SNF/HD clinic.   Alphonzo Cruise, [place] Renal Navigator [phone]

## 2020-02-28 NOTE — Progress Notes (Signed)
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  On arrival she was satting about 70%.  She initially required BiPAP and was transitioned to 5 L.  She was febrile to 101.8, and Covid was found to be positive  Subjective / 24h Interval events: No significant complaints, sleeping quietly verbal but wakes up easily  Assessment & Plan: Principal Problem Acute on chronic hypoxic respiratory failure due to COVID-19 viral pneumonia-finished Remdesivir and a course of steroids -Initially on admission due to elevated procalcitonin she has been on ceftriaxone azithromycin for 7 days, currently monitor off antibiotics -Respiratory status stable, currently on 2 L nasal cannula -There was concern for DVT due to left lower extremity edema and elevated D-dimer, but Doppler was negative -She tested positive on 1/25, can be taken off precautions on 2/15 -Needs SNF, placement pending, some skilled facilities can take after 14 days which would be tomorrow 2/8  Active Problems Acute kidney injury on chronic kidney disease stage V, metabolic acidosis, now end-stage-nephrology consulted, had a temporary dialysis cath placed and she is now dialysis dependent with plans to arrange outpatient hemodialysis.  Lasix, spironolactone, losartan on hold  Leukocytosis -Yesterday and today patient's WBC is slightly higher.  This is possibly due to steroids, clinically she is stable.  She is afebrile.  No respiratory symptoms other than #1.  Continue to monitor  Acute metabolic encephalopathy, underlying dementia -Appears improved, due to Covid -resume home  Aricept -Her encephalopathy seems a lot better, she is able to converse without difficulties, has some memory issues which are obvious but thought process is appropriate  Insulin-dependent diabetes mellitus -A1c 7.4, continue sliding scale  CBG (last 3)  Recent Labs    02/27/20 1732 02/27/20 2045 02/28/20 0732  GLUCAP 239* 295* 99   Thrombocytopenia -Due to chronic renal disease, COVID-19.  Now normalized.  COPD -Stable, no wheezing  Anemia due to chronic kidney disease -No bleeding, hemoglobin was 7 on 2/5, she was transfused unit of packed red blood cells, improved, stable  Scheduled Meds: . amLODipine  10 mg Oral QPM  . vitamin C  500 mg Oral Daily  . aspirin EC  81 mg Oral Daily  . carvedilol  25 mg Oral BID  . Chlorhexidine Gluconate Cloth  6 each Topical Q0600  . cholecalciferol  1,000 Units Oral Daily  . darbepoetin (ARANESP) injection - DIALYSIS  150 mcg Intravenous Q Fri-HD  . donepezil  5 mg Oral QHS  . heparin injection (subcutaneous)  7,500 Units Subcutaneous Q8H  . hydrALAZINE  100 mg Oral TID  . insulin aspart  0-5 Units Subcutaneous QHS  . insulin aspart  0-6 Units Subcutaneous TID WC  . insulin detemir  14 Units Subcutaneous Q2200  . zinc sulfate  220 mg Oral Daily   Continuous Infusions: PRN Meds:.acetaminophen, guaiFENesin-dextromethorphan, Ipratropium-Albuterol, lip balm, ondansetron (ZOFRAN) IV  Diet Orders (From admission, onward)    Start     Ordered   02/20/20 0802  Diet renal/carb modified with fluid restriction Diet-HS Snack? Nothing; Fluid restriction: 1200 mL Fluid; Room service appropriate? No; Fluid consistency: Thin  Diet effective now       Question Answer Comment  Diet-HS Snack?  Nothing   Fluid restriction: 1200 mL Fluid   Room service appropriate? No   Fluid consistency: Thin      02/20/20 0801          DVT prophylaxis:      Code Status: Full Code  Family Communication: no family at bedside, updated son over the phone on  3/6  Status is: Inpatient  Remains inpatient appropriate because:Inpatient level of care appropriate due to severity of illness   Dispo: The patient is from: Home              Anticipated d/c is to: SNF              Anticipated d/c date is: 2 days              Patient currently is not medically stable to d/c.   Difficult to place patient No  Level of care: Telemetry Medical  Consultants:  Nephrology  IR  Procedures:  none  Microbiology  none  Antimicrobials: none    Objective: Vitals:   02/27/20 1602 02/27/20 2000 02/27/20 2126 02/28/20 0435  BP: (!) 125/53 (!) 132/59 (!) 143/63 (!) 109/55  Pulse: 60 62 65 75  Resp: 18 18 18 18   Temp: 98 F (36.7 C) 97.8 F (36.6 C) 97.8 F (36.6 C) 98.2 F (36.8 C)  TempSrc: Oral Oral Oral Oral  SpO2: 96% 99% 96% 97%  Weight:      Height:        Intake/Output Summary (Last 24 hours) at 02/28/2020 1017 Last data filed at 02/27/2020 1748 Gross per 24 hour  Intake 90 ml  Output --  Net 90 ml   Filed Weights   02/25/20 0500 02/25/20 1310 02/25/20 1558  Weight: 114.4 kg 115.4 kg (P) 114 kg    Examination:  Constitutional: No distress, calm, comfortable Eyes: No scleral icterus ENMT: Moist mucous membranes Neck: normal, supple Respiratory: Clear bilaterally without wheezing or crackles, diminished at the bases Cardiovascular: Regular rate and rhythm, no murmurs, trace edema Abdomen: Soft, nontender, nondistended, bowel sounds positive Skin: No rashes seen Neurologic: No focal deficits  Data Reviewed: I have independently reviewed following labs and imaging studies   CBC: Recent Labs  Lab 02/24/20 0150 02/25/20 0200 02/26/20 0500 02/27/20 0923 02/28/20 0500  WBC 12.4* 15.6* 25.3* 23.2* 16.5*  HGB 7.3* 7.0* 7.0* 9.1* 8.5*  HCT 23.7* 22.1* 23.8* 30.6* 26.8*  MCV 66.4* 65.8* 68.4* 72.3* 71.3*  PLT 207 206 196 155 469   Basic Metabolic Panel: Recent Labs  Lab 02/23/20 0234 02/24/20 0150 02/25/20 0200  02/26/20 0500 02/27/20 0923  NA 136 137 136 135 131*  K 3.9 3.7 3.6 3.5 3.9  CL 100 99 98 98 96*  CO2 22 23 24 24 22   GLUCOSE 308* 174* 168* 239* 145*  BUN 72* 35* 46* 31* 41*  CREATININE 8.89* 5.75* 7.06* 5.97* 7.30*  CALCIUM 7.5* 7.1* 7.6* 7.3* 7.7*  PHOS  --   --   --  3.2  --    Liver Function Tests: Recent Labs  Lab 02/23/20 0234 02/26/20 0500 02/27/20 0923  AST 26  --  23  ALT 7  --  10  ALKPHOS 69  --  68  BILITOT 0.7  --  0.7  PROT 6.1*  --  5.9*  ALBUMIN 2.4* 2.3* 2.5*   Coagulation Profile: No results for input(s): INR, PROTIME in the last 168 hours. HbA1C: No results for input(s): HGBA1C in the last 72 hours. CBG:  Recent Labs  Lab 02/27/20 0747 02/27/20 1212 02/27/20 1732 02/27/20 2045 02/28/20 0732  GLUCAP 146* 161* 239* 295* 99    No results found for this or any previous visit (from the past 240 hour(s)).   Radiology Studies: No results found.  Marzetta Board, MD, PhD [facility]  Between 7 am - 7 pm I am available, please contact me via Amion or Securechat  Between 7 pm - 7 am I am not available, please contact night coverage MD/APP via Amion

## 2020-02-28 NOTE — NC FL2 (Signed)
[place] MEDICAID FL2 LEVEL OF CARE SCREENING TOOL     IDENTIFICATION  Patient Name: Joann Gutierrez Birthdate: 10/12/1945 Sex: female Admission Date (Current Location): 02/15/2020  [facility] and [place] Number:  Herbalist and Address:  [facility] [place] [place] [address], [place], [place] [address]      Provider Number: [id number]  Attending Physician Name and Address:  Caren Griffins, MD  Relative Name and Phone Number:  Asencion Islam [phone]    Current Level of Care: Hospital Recommended Level of Care: [place] Prior Approval Number:    Date Approved/Denied:   PASRR Number: [id number]  Discharge Plan: SNF    Current Diagnoses: Patient Active Problem List   Diagnosis Date Noted  . Sepsis ([place]) 02/16/2020  . Acute on chronic respiratory failure with hypoxia ([place]) 02/16/2020  . Edema of left lower leg 02/16/2020  . Pneumonia due to COVID-19 virus 02/15/2020  . Acute on chronic diastolic HF (heart failure) ([place]) 11/18/2019  . Dyslipidemia 11/18/2019  . Polypharmacy 04/13/2019  . Healthcare maintenance 10/15/2017  . ARF (acute renal failure) ([place]) 09/07/2017  . Chest pain 09/05/2017  . Ineffective health maintenance 06/26/2017  . Medication management 06/26/2017  . Retinal edema 10/07/2016  . Postherpetic neuralgia 12/21/2015  . Herpes zoster with complication 81/10/3157  . Cellulitis of face 11/27/2015  . Anemia of chronic disease 08/17/2015  . History of colonic polyps   . Hypertensive emergency 05/20/2015  . Hypokalemia 05/20/2015  . AKI (acute kidney injury) ([place])   . Esophageal dysmotility 03/01/2015  . Throat congestion 10/11/2014  . Morbid obesity due to excess calories ([place]) 09/23/2014  . Acute asthmatic bronchitis 05/20/2014  . Abdominal pain, other specified site 09/13/2013  . Elevated liver enzymes 09/13/2013  . Cough 05/05/2013  . Pain in lower limb 03/05/2013  . DOE (dyspnea on  exertion) 02/03/2013  . Chronic respiratory failure ([place]) 12/13/2012  . Stricture and stenosis of esophagus 08/31/2012  . Onychomycosis 06/01/2012  . Pain in joint, ankle and foot 06/01/2012  . Insulin dependent diabetes mellitus 04/29/2012  . Essential hypertension 07/19/2011  . CKD (chronic kidney disease), stage III ([place]) 05/19/2011  . Family history of malignant neoplasm of gastrointestinal tract 10/30/2010  . Bloating 10/16/2010  . Epigastric pain 10/16/2010  . Foot pain 09/25/2010  . Dysphagia 07/16/2010  . GERD (gastroesophageal reflux disease) 07/16/2010  . Microcytic anemia 07/16/2010  . Obstructive sleep apnea 06/16/2009  . HYPERCHOLESTEROLEMIA 02/16/2009  . 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 Gastroparesis 08/21/2007  . Chronic obstructive asthma ([place]) 08/09/2006    Orientation RESPIRATION BLADDER Height & Weight     Place,Self  O2 (nasal cannula 2L) Incontinent Weight: (P) 251 lb 5.2 oz (114 kg) Height:  5\' 7"  (170.2 cm)  BEHAVIORAL SYMPTOMS/MOOD NEUROLOGICAL BOWEL NUTRITION STATUS      Incontinent Diet (Please see [place] summary)  AMBULATORY STATUS COMMUNICATION OF NEEDS Skin   Extensive Assist Verbally Normal                       Personal Care Assistance Level of Assistance  Bathing,Feeding,Dressing Bathing Assistance: Maximum assistance Feeding assistance: Limited assistance Dressing Assistance: Limited assistance     Functional Limitations Info  Sight,Hearing,Speech Sight Info: Impaired Hearing Info: Impaired Speech Info: Adequate    SPECIAL CARE FACTORS FREQUENCY  PT (By licensed PT),OT (By licensed OT)     PT Frequency: Eval 2/3 PT at SNF eval and treat a minimum of 5 days  per week OT Frequency: PT at SNF eval and treat a minimum of 5 days per week            Contractures      Additional Factors Info  Insulin Sliding Scale Code Status Info: Code: Full Allergies Info: Morphine And Related, Promethazine Hcl   Insulin Sliding  Scale Info: see D/C summary Isolation Precautions Info: 02/15/20 COVID positive     Current Medications (02/28/2020):  This is the current hospital active medication list Current Facility-Administered Medications  Medication Dose Route Frequency Provider Last Rate Last Admin  . acetaminophen (TYLENOL) tablet 650 mg  650 mg Oral Q6H PRN Shela Leff, MD   650 mg at 02/27/20 2105  . amLODipine (NORVASC) tablet 5 mg  5 mg Oral QPM Rosita Fire, MD      . ascorbic acid (VITAMIN C) tablet 500 mg  500 mg Oral Daily Shela Leff, MD   500 mg at 02/28/20 0924  . aspirin EC tablet 81 mg  81 mg Oral Daily Donne Hazel, MD   81 mg at 02/28/20 7517  . carvedilol (COREG) tablet 25 mg  25 mg Oral BID Donne Hazel, MD   25 mg at 02/27/20 2100  . Chlorhexidine Gluconate Cloth 2 % PADS 6 each  6 each Topical Q0600 Corliss Parish, MD   6 each at 02/28/20 1055  . cholecalciferol (VITAMIN D3) tablet 1,000 Units  1,000 Units Oral Daily Shela Leff, MD   1,000 Units at 02/28/20 [phone]  . Darbepoetin Alfa (ARANESP) injection 150 mcg  150 mcg Intravenous Q Fri-HD Pham, Minh Q, RPH-CPP   150 mcg at 02/25/20 1529  . donepezil (ARICEPT) tablet 5 mg  5 mg Oral QHS Caren Griffins, MD   5 mg at 02/27/20 2100  . guaiFENesin-dextromethorphan (ROBITUSSIN DM) 100-10 MG/5ML syrup 10 mL  10 mL Oral Q4H PRN Shela Leff, MD   10 mL at 02/21/20 2332  . heparin injection 7,500 Units  7,500 Units Subcutaneous Q8H Donne Hazel, MD   7,500 Units at 02/28/20 0502  . hydrALAZINE (APRESOLINE) tablet 100 mg  100 mg Oral TID Donne Hazel, MD   100 mg at 02/27/20 2100  . insulin aspart (novoLOG) injection 0-5 Units  0-5 Units Subcutaneous QHS Shela Leff, MD   3 Units at 02/27/20 2100  . insulin aspart (novoLOG) injection 0-6 Units  0-6 Units Subcutaneous TID WC Shela Leff, MD   2 Units at 02/27/20 1747  . insulin detemir (LEVEMIR) injection 14 Units  14 Units Subcutaneous Q2200  Donne Hazel, MD   14 Units at 02/27/20 2101  . Ipratropium-Albuterol (COMBIVENT) respimat 1 puff  1 puff Inhalation Q6H PRN Shela Leff, MD      . lip balm (CARMEX) ointment   Topical PRN Shalhoub, Sherryll Burger, MD      . ondansetron [facility]) injection 4 mg  4 mg Intravenous Q6H PRN Shalhoub, Sherryll Burger, MD   4 mg at 02/20/20 0114  . zinc sulfate capsule 220 mg  220 mg Oral Daily Shela Leff, MD   220 mg at 02/28/20 4944     Discharge Medications: Please see discharge summary for a list of discharge medications.  Relevant Imaging Results:  Relevant Lab Results:   Additional Information SS#: [id number] requires dialysis tranpsort scheduled TBD  Chaz Ronning, LCSWA

## 2020-02-28 NOTE — TOC Progression Note (Addendum)
Transition of Care [facility]) - Progression Note    Patient Details  Name: Joann Gutierrez MRN: [id number] Date of Birth: 04-26-1945  Transition of Care [facility]) CM/SW [place], LCSW Phone Number: 02/28/2020, 1:19 PM  Clinical Narrative:    9am-[facility] checking to see if they can accept patient since she is 14 days past COVID + test.   12pm-[facility] can accept patient pending arrangement of dialysis transportation.  1pm-CSW will complete [facility] [place] application for when patient is 21 days past her COVID + test (her isolation window). CSW updated Renal Navigator so that outpatient HD can be arranged. CSW will need updated PT note for insurance approval.   4pm-CSW notified [place] that patient is managed by the regular [employer] plan and not Navi.  Expected Discharge Plan: [place] Barriers to Discharge: Continued Medical Work up  Expected Discharge Plan and Services Expected Discharge Plan: [place] In-house Referral: Clinical Social Work   Post Acute Care Choice: [place] Living arrangements for the past 2 months: Single Family Home                                       Social Determinants of Health (SDOH) Interventions    Readmission Risk Interventions No flowsheet data found.

## 2020-02-28 NOTE — Progress Notes (Signed)
Physical Therapy Treatment Patient Details Name: Joann Gutierrez MRN: [id number] DOB: 07-Oct-1945 Today's Date: 02/28/2020    History of Present Illness 75 y.o. female with a hx of chronic respiratory failure on 2L via [place] @ baseline, CHF last EF 65-70%, pulmonary hypertension, CKD, DM, COPD, anemia, hyperlipidemia, sleep apnea on CPAP @ night, & anemia who presents to the emergency department via EMS for progressively worsening shortness of breath x 6 days. Patient found to have respiratory failure 2/2 COVID PNA.    PT Comments    Patient very pleasant and cooperative. Happy to get up and walk. Participated in exercises x 4 extremities and walking x 2 (14 ft each time). Limited by fatigue. Can continue to benefit from skilled PT to prepare for ultimate plan of discharge home after short-term rehab stay.     Follow Up Recommendations  SNF;Supervision/Assistance - 24 hour     Equipment Recommendations  Rolling walker with 5" wheels;3in1 (PT)    Recommendations for Other Services       Precautions / Restrictions Precautions Precautions: Fall Precaution Comments: watch O2    Mobility  Bed Mobility                  Transfers Overall transfer level: Needs assistance Equipment used: Rolling walker (2 wheeled) Transfers: Sit to/from Stand Sit to Stand: Min guard         General transfer comment: x 2 from different chairs  Ambulation/Gait Ambulation/Gait assistance: Min guard Gait Distance (Feet): 14 Feet (x2; seated rest between x 4 minutes) Assistive device: Rolling walker (2 wheeled) Gait Pattern/deviations: Step-to pattern;Decreased step length - right;Decreased step length - left;Shuffle Gait velocity: slowed   General Gait Details: vc for upright posture and avoiding obstacle x 1   Stairs             Wheelchair Mobility    Modified Rankin (Stroke Patients Only)       Balance Overall balance assessment: Needs assistance Sitting-balance  support: No upper extremity supported;Feet supported Sitting balance-[name]: Fair     Standing balance support: Bilateral upper extremity supported;During functional activity Standing balance-[name]: Poor Standing balance comment: requires UE support in standing                            Cognition Arousal/Alertness: Awake/alert Behavior During Therapy: WFL for tasks assessed/performed Overall Cognitive Status: History of cognitive impairments - at baseline                                 General Comments: appropriate, very appreciative      Exercises General Exercises - Lower Extremity Ankle Circles/Pumps: AROM;Both;10 reps Long Arc Quad: AROM;Both;5 reps Other Exercises Other Exercises: hand pumps x 10; overhead reach x 5 each UE    General Comments General comments (skin integrity, edema, etc.): on 2L with sats 89-98%      Pertinent Vitals/Pain Pain Assessment: Faces Faces Pain Scale: No hurt    Home Living                      Prior Function            PT Goals (current goals can now be found in the care plan section) Acute Rehab PT Goals Patient Stated Goal: to be safe Time For Goal Achievement: 03/04/20 Potential to Achieve Goals: Fair Progress towards PT goals:  Progressing toward goals    Frequency    Min 3X/week      PT Plan Current plan remains appropriate    Co-evaluation              AM-PAC PT "6 Clicks" Mobility   Outcome Measure  Help needed turning from your back to your side while in a flat bed without using bedrails?: A Little Help needed moving from lying on your back to sitting on the side of a flat bed without using bedrails?: A Little Help needed moving to and from a bed to a chair (including a wheelchair)?: A Little Help needed standing up from a chair using your arms (e.g., wheelchair or bedside chair)?: A Little Help needed to walk in hospital room?: A Little Help needed climbing  3-5 steps with a railing? : Total 6 Click Score: 16    End of Session Equipment Utilized During Treatment: Oxygen;Gait belt Activity Tolerance: Patient limited by fatigue Patient left: with call bell/phone within reach;in chair   PT Visit Diagnosis: Unsteadiness on feet (R26.81);Muscle weakness (generalized) (M62.81);Other abnormalities of gait and mobility (R26.89)     Time: 4996-9249 PT Time Calculation (min) (ACUTE ONLY): 26 min  Charges:  $Gait Training: 8-22 mins $Therapeutic Exercise: 8-22 mins                      Joann Gutierrez, PT Pager [phone]    Joann Gutierrez 02/28/2020, 3:41 PM

## 2020-02-28 NOTE — Progress Notes (Signed)
[facility] NEPHROLOGY PROGRESS NOTE  Assessment/ Plan: Pt is a 75 y.o. yo female with COPD, chronic hypoxia, hypertension, HLD, CKD admitted with shortness of breath, Covid positive and worsening renal failure.  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  Started on hemodialysis for uremic signs and symptoms along with volume overload.  She has tolerated dialysis well.  Continue MWF schedule.  Plan for HD today.   Her right upper arm AV fistula infiltrated which will need evaluation as an outpatient.   Currently has right IJ [facility] for the access.  Discussed with social worker to arrange outpatient dialysis unit placement.  # Acute hypoxic respiratory failure: Secondary to COVID-19 infection.  Treated with remdesivir and corticosteroids with clinical improvement and decreased oxygenation requirements.  She is on oxygen at baseline for COPD.  # Metabolic encephalopathy: Likely multifactorial from underlying infection/uremia-improving with dialysis.  # Anemia: Secondary to underlying chronic kidney disease and acute/critical illness.  Continue to monitor on ESA.  # Secondary hyperparathyroidism: Phosphorus and PTH level at goal.  #Hypertension/volume: Blood pressure soft.  I will lower amlodipine to 5 mg nightly.  Continue Coreg.  Subjective: Seen and examined.  Sitting on chair comfortable.  Denies nausea vomiting chest pain shortness of breath.  Plan for dialysis today. Objective Vital signs in last 24 hours: Vitals:   02/27/20 1602 02/27/20 2000 02/27/20 2126 02/28/20 0435  BP: (!) 125/53 (!) 132/59 (!) 143/63 (!) 109/55  Pulse: 60 62 65 75  Resp: 18 18 18 18   Temp: 98 F (36.7 C) 97.8 F (36.6 C) 97.8 F (36.6 C) 98.2 F (36.8 C)  TempSrc: Oral Oral Oral Oral  SpO2: 96% 99% 96% 97%  Weight:      Height:       Weight change:   Intake/Output Summary (Last 24 hours) at 02/28/2020 1208 Last data filed at 02/28/2020 1021 Gross per 24  hour  Intake 390 ml  Output --  Net 390 ml       Labs: Basic Metabolic Panel: Recent Labs  Lab 02/25/20 0200 02/26/20 0500 02/27/20 0923  NA 136 135 131*  K 3.6 3.5 3.9  CL 98 98 96*  CO2 24 24 22   GLUCOSE 168* 239* 145*  BUN 46* 31* 41*  CREATININE 7.06* 5.97* 7.30*  CALCIUM 7.6* 7.3* 7.7*  PHOS  --  3.2  --    Liver Function Tests: Recent Labs  Lab 02/23/20 0234 02/26/20 0500 02/27/20 0923  AST 26  --  23  ALT 7  --  10  ALKPHOS 69  --  68  BILITOT 0.7  --  0.7  PROT 6.1*  --  5.9*  ALBUMIN 2.4* 2.3* 2.5*   No results for input(s): LIPASE, AMYLASE in the last 168 hours. No results for input(s): AMMONIA in the last 168 hours. CBC: Recent Labs  Lab 02/24/20 0150 02/25/20 0200 02/26/20 0500 02/27/20 0923 02/28/20 0500  WBC 12.4* 15.6* 25.3* 23.2* 16.5*  HGB 7.3* 7.0* 7.0* 9.1* 8.5*  HCT 23.7* 22.1* 23.8* 30.6* 26.8*  MCV 66.4* 65.8* 68.4* 72.3* 71.3*  PLT 207 206 196 155 157   Cardiac Enzymes: No results for input(s): CKTOTAL, CKMB, CKMBINDEX, TROPONINI in the last 168 hours. CBG: Recent Labs  Lab 02/27/20 0747 02/27/20 1212 02/27/20 1732 02/27/20 2045 02/28/20 0732  GLUCAP 146* 161* 239* 295* 99    Iron Studies: No results for input(s): IRON, TIBC, TRANSFERRIN, FERRITIN in the last 72 hours. Studies/Results: No  results found.  Medications: Infusions:   Scheduled Medications: . amLODipine  10 mg Oral QPM  . vitamin C  500 mg Oral Daily  . aspirin EC  81 mg Oral Daily  . carvedilol  25 mg Oral BID  . Chlorhexidine Gluconate Cloth  6 each Topical Q0600  . cholecalciferol  1,000 Units Oral Daily  . darbepoetin (ARANESP) injection - DIALYSIS  150 mcg Intravenous Q Fri-HD  . donepezil  5 mg Oral QHS  . heparin injection (subcutaneous)  7,500 Units Subcutaneous Q8H  . hydrALAZINE  100 mg Oral TID  . insulin aspart  0-5 Units Subcutaneous QHS  . insulin aspart  0-6 Units Subcutaneous TID WC  . insulin detemir  14 Units Subcutaneous  Q2200  . zinc sulfate  220 mg Oral Daily    have reviewed scheduled and prn medications.  Physical Exam: General:NAD, comfortable Heart:RRR, s1s2 nl Lungs:clear b/l, no crackle Abdomen:soft, Non-tender, non-distended Extremities: Trace LE edema Dialysis Access: Right IJ TDC.  Tj Kitchings Tanna Furry 02/28/2020,12:08 PM  LOS: 13 days

## 2020-02-28 NOTE — Plan of Care (Signed)
  Problem: Education: Goal: Knowledge of General Education information will improve Description: Including pain rating scale, medication(s)/side effects and non-pharmacologic comfort measures Outcome: Progressing   Problem: Health Behavior/Discharge Planning: Goal: Ability to manage health-related needs will improve Outcome: Progressing   Problem: Clinical Measurements: Goal: Ability to maintain clinical measurements within normal limits will improve Outcome: Progressing Goal: Will remain free from infection Outcome: Progressing Goal: Diagnostic test results will improve Outcome: Progressing Goal: Respiratory complications will improve Outcome: Progressing Goal: Cardiovascular complication will be avoided Outcome: Progressing   Problem: Nutrition: Goal: Adequate nutrition will be maintained Outcome: Progressing   Problem: Coping: Goal: Level of anxiety will decrease Outcome: Progressing   Problem: Elimination: Goal: Will not experience complications related to bowel motility Outcome: Progressing Goal: Will not experience complications related to urinary retention Outcome: Progressing   Problem: Education: Goal: Knowledge of risk factors and measures for prevention of condition will improve Outcome: Progressing   Problem: Coping: Goal: Psychosocial and spiritual needs will be supported Outcome: Progressing   Problem: Respiratory: Goal: Will maintain a patent airway Outcome: Progressing Goal: Complications related to the disease process, condition or treatment will be avoided or minimized Outcome: Progressing   Problem: Activity: Goal: Risk for activity intolerance will decrease Outcome: Not Progressing

## 2020-02-29 ENCOUNTER — Inpatient Hospital Stay (HOSPITAL_COMMUNITY): Payer: Medicare HMO

## 2020-02-29 LAB — GLUCOSE, CAPILLARY
Glucose-Capillary: 140 mg/dL — ABNORMAL HIGH (ref 70–99)
Glucose-Capillary: 21 mg/dL — CL (ref 70–99)
Glucose-Capillary: 38 mg/dL — CL (ref 70–99)
Glucose-Capillary: 45 mg/dL — ABNORMAL LOW (ref 70–99)
Glucose-Capillary: 143 mg/dL — ABNORMAL HIGH (ref 70–99)
Glucose-Capillary: 180 mg/dL — ABNORMAL HIGH (ref 70–99)
Glucose-Capillary: 223 mg/dL — ABNORMAL HIGH (ref 70–99)
Glucose-Capillary: 202 mg/dL — ABNORMAL HIGH (ref 70–99)
Glucose-Capillary: 180 mg/dL — ABNORMAL HIGH (ref 70–99)
Glucose-Capillary: 251 mg/dL — ABNORMAL HIGH (ref 70–99)
Glucose-Capillary: 194 mg/dL — ABNORMAL HIGH (ref 70–99)
Glucose-Capillary: 177 mg/dL — ABNORMAL HIGH (ref 70–99)

## 2020-02-29 LAB — BASIC METABOLIC PANEL
Anion gap: 13 (ref 5–15)
BUN: 32 mg/dL — ABNORMAL HIGH (ref 8–23)
CO2: 23 mmol/L (ref 22–32)
Calcium: 7.5 mg/dL — ABNORMAL LOW (ref 8.9–10.3)
Chloride: 96 mmol/L — ABNORMAL LOW (ref 98–111)
Creatinine, Ser: 6.41 mg/dL — ABNORMAL HIGH (ref 0.44–1.00)
GFR, Estimated: 6 mL/min — ABNORMAL LOW (ref >60)
Glucose, Bld: 178 mg/dL — ABNORMAL HIGH (ref 70–99)
Potassium: 3.9 mmol/L (ref 3.5–5.1)
Sodium: 132 mmol/L — ABNORMAL LOW (ref 135–145)

## 2020-02-29 LAB — CBC
HCT: 32.1 % — ABNORMAL LOW (ref 36.0–46.0)
Hemoglobin: 9.3 g/dL — ABNORMAL LOW (ref 12.0–15.0)
MCH: 21.9 pg — ABNORMAL LOW (ref 26.0–34.0)
MCHC: 29 g/dL — ABNORMAL LOW (ref 30.0–36.0)
MCV: 75.5 fL — ABNORMAL LOW (ref 80.0–100.0)
Platelets: 152 10*3/uL (ref 150–400)
RBC: 4.25 MIL/uL (ref 3.87–5.11)
RDW: 24.9 % — ABNORMAL HIGH (ref 11.5–15.5)
WBC: 18.9 10*3/uL — ABNORMAL HIGH (ref 4.0–10.5)
nRBC: 1.2 % — ABNORMAL HIGH (ref 0.0–0.2)

## 2020-02-29 MED ORDER — DEXTROSE 10 % IV SOLN: INTRAVENOUS | Status: AC

## 2020-02-29 MED ORDER — CHLORHEXIDINE GLUCONATE CLOTH 2 % EX PADS
6.0000 | MEDICATED_PAD | Freq: Every day | CUTANEOUS | Status: DC
  Administered 2020-03-01 – 2020-03-02 (×2): 6 via TOPICAL

## 2020-02-29 MED ORDER — GLUCAGON HCL RDNA (DIAGNOSTIC) 1 MG IJ SOLR
INTRAMUSCULAR | Status: AC
  Administered 2020-02-29: 1 mg
  Filled 2020-02-29: qty 1

## 2020-02-29 MED ORDER — GLUCOSE 4 G PO CHEW
CHEWABLE_TABLET | ORAL | Status: AC
  Filled 2020-02-29: qty 1

## 2020-02-29 NOTE — Progress Notes (Addendum)
PROGRESS NOTE  Joann Gutierrez HBZ:[id number] DOB: [birth year] DOA: 02/15/2020 PCP: Andree Moro, DO   LOS: 14 days   Brief Narrative / Interim history: 75 year old female with dementia, asthma, COPD, chronic hypoxia with 2 L home oxygen, CAD, chronic kidney disease stage V with AV graft in place, diabetes mellitus, hypertension, hyperlipidemia, obesity came into the hospital with shortness of breath.  On arrival she was satting about 70%.  She initially required BiPAP and was transitioned to 5 L.  She was febrile to 101.8, and Covid was found to be positive.  Hospital course complicated by progressive renal failure now dialysis dependent.  Had an episode of symptomatic hypoglycemia on 2/8  Subjective / 24h Interval events: Rapid response called at bedside this morning, I will start patient evaluate patient due to being unresponsive in the setting of hypoglycemia with CBG of 19  Assessment & Plan: Principal Problem Acute on chronic hypoxic respiratory failure due to COVID-19 viral pneumonia-finished Remdesivir and a course of steroids -Initially on admission due to elevated procalcitonin she has been on ceftriaxone azithromycin for 7 days, currently monitor off antibiotics -Respiratory status stable, currently on 2 L nasal cannula -There was concern for DVT due to left lower extremity edema and elevated D-dimer, but Doppler was negative -She tested positive on 1/25, can be taken off precautions on 2/15 -Needs SNF, placement pending, some skilled facilities can take after 14 days which is today 2/8  Active Problems Acute hypoglycemia -This happened suddenly the morning of 2/8, patient was found unresponsive with a blood sugar of 19.  Apparently she underwent dialysis yesterday, came back to late and did not eat dinner.  She received her regular Lantus and was found to be hypoglycemic this morning.  This is a bit unusual as she has been stable over the last several days with CBGs into the  100-200s.  An IV access was obtained, she was given glucagon IM and dextrose IV, and when she was awake enough had an orange juice.  She seems back to baseline.  Would favor limited D10 infusion 50 cc/h x 8 hours (400 cc total) with every hour Accu-Cheks.  Discontinue long-acting insulin for now.  Reevaluated at bedside an hour later after initial event, alert and oriented to her baseline, eating breakfast, no complaints  Acute kidney injury on chronic kidney disease stage V, metabolic acidosis, now end-stage-nephrology consulted, had a temporary dialysis cath placed and she is now dialysis dependent with plans to arrange outpatient hemodialysis.  Lasix, spironolactone, losartan on hold  Leukocytosis -Yesterday and today patient's WBC is slightly higher.  This is possibly due to steroids.  CBC this morning pending.  Given hypoglycemia raising concern for infection, repeat chest x-ray. No role for UA as she is on dialysis  Acute metabolic encephalopathy, underlying dementia -Worsened this morning in the setting of hypoglycemia but once that is corrected back to baseline -Appears improved, due to Covid -resume home Aricept -Her encephalopathy seems a lot better, she is able to converse without difficulties, has some memory issues which are obvious but thought process is appropriate  Insulin-dependent diabetes mellitus -A1c 7.4, continue sliding scale  CBG (last 3)  Recent Labs    02/29/20 0815 02/29/20 0821 02/29/20 0912  GLUCAP 45* 140* 143*   Thrombocytopenia -Due to chronic renal disease, COVID-19.  Now normalized.  COPD -Stable, no wheezing  Anemia due to chronic kidney disease -No bleeding, hemoglobin was 7 on 2/5, she was transfused unit of packed red blood cells, hemoglobin has  remained stable  Scheduled Meds: . amLODipine  5 mg Oral QPM  . vitamin C  500 mg Oral Daily  . aspirin EC  81 mg Oral Daily  . carvedilol  25 mg Oral BID  . Chlorhexidine Gluconate Cloth  6 each  Topical Q0600  . cholecalciferol  1,000 Units Oral Daily  . darbepoetin (ARANESP) injection - DIALYSIS  150 mcg Intravenous Q Fri-HD  . donepezil  5 mg Oral QHS  . glucose      . heparin injection (subcutaneous)  7,500 Units Subcutaneous Q8H  . hydrALAZINE  100 mg Oral TID  . insulin aspart  0-5 Units Subcutaneous QHS  . insulin aspart  0-6 Units Subcutaneous TID WC  . insulin detemir  14 Units Subcutaneous Q2200  . zinc sulfate  220 mg Oral Daily   Continuous Infusions: . dextrose 50 mL/hr at 02/29/20 0821   PRN Meds:.acetaminophen, guaiFENesin-dextromethorphan, Ipratropium-Albuterol, lip balm, ondansetron (ZOFRAN) IV  Diet Orders (From admission, onward)    Start     Ordered   02/20/20 0802  Diet renal/carb modified with fluid restriction Diet-HS Snack? Nothing; Fluid restriction: 1200 mL Fluid; Room service appropriate? No; Fluid consistency: Thin  Diet effective now       Question Answer Comment  Diet-HS Snack? Nothing   Fluid restriction: 1200 mL Fluid   Room service appropriate? No   Fluid consistency: Thin      02/20/20 0801         DVT prophylaxis:     Code Status: Full Code  Family Communication: no family at bedside, updated son over the phone on 2/7, I called son today to update on hypoglycemic episode several times, no answer, did not go to voicemail  Status is: Inpatient  Remains inpatient appropriate because:Inpatient level of care appropriate due to severity of illness   Dispo: The patient is from: Home              Anticipated d/c is to: SNF              Anticipated d/c date is: 2 days              Patient currently is not medically stable to d/c.   Difficult to place patient No  Level of care: Telemetry Medical  Consultants:  Nephrology  IR  Procedures:  none  Microbiology  none  Antimicrobials: none    Objective: Vitals:   02/28/20 2217 02/29/20 0433 02/29/20 0750 02/29/20 0810  BP:  (!) 125/58 (!) 146/66 138/71  Pulse: 61 64 61  (!) 59  Resp: 18 19 19    Temp:  97.6 F (36.4 C) 97.8 F (36.6 C)   TempSrc:  Oral Axillary   SpO2: 91% 90% 96% 95%  Weight:      Height:        Intake/Output Summary (Last 24 hours) at 02/29/2020 1001 Last data filed at 02/29/2020 0900 Gross per 24 hour  Intake 540 ml  Output 1500 ml  Net -960 ml   Filed Weights   02/25/20 1558 02/28/20 1615 02/28/20 1855  Weight: (P) 114 kg 113.6 kg 112 kg    Examination:  Constitutional: Initially unresponsive but later no distress, eating breakfast, watching TV Eyes: No scleral icterus ENMT: mmm Neck: normal, supple Respiratory: Clear bilaterally, no wheezing or crackles Cardiovascular: Regular rate and rhythm, no murmurs, trace edema Abdomen: Soft, NT, ND, bowel sounds positive Skin: No rashes seen Neurologic: Nonfocal, equal strength all 4 extremities  Data Reviewed: I have  independently reviewed following labs and imaging studies   CBC: Recent Labs  Lab 02/24/20 0150 02/25/20 0200 02/26/20 0500 02/27/20 0923 02/28/20 0500  WBC 12.4* 15.6* 25.3* 23.2* 16.5*  HGB 7.3* 7.0* 7.0* 9.1* 8.5*  HCT 23.7* 22.1* 23.8* 30.6* 26.8*  MCV 66.4* 65.8* 68.4* 72.3* 71.3*  PLT 207 206 196 155 462   Basic Metabolic Panel: Recent Labs  Lab 02/24/20 0150 02/25/20 0200 02/26/20 0500 02/27/20 0923 02/28/20 1605  NA 137 136 135 131* 129*  K 3.7 3.6 3.5 3.9 3.6  CL 99 98 98 96* 92*  CO2 23 24 24 22 24   GLUCOSE 174* 168* 239* 145* 181*  BUN 35* 46* 31* 41* 55*  CREATININE 5.75* 7.06* 5.97* 7.30* 8.92*  CALCIUM 7.1* 7.6* 7.3* 7.7* 7.3*  PHOS  --   --  3.2  --  5.4*   Liver Function Tests: Recent Labs  Lab 02/23/20 0234 02/26/20 0500 02/27/20 0923 02/28/20 1605  AST 26  --  23  --   ALT 7  --  10  --   ALKPHOS 69  --  68  --   BILITOT 0.7  --  0.7  --   PROT 6.1*  --  5.9*  --   ALBUMIN 2.4* 2.3* 2.5* 2.5*   Coagulation Profile: No results for input(s): INR, PROTIME in the last 168 hours. HbA1C: No results for input(s):  HGBA1C in the last 72 hours. CBG: Recent Labs  Lab 02/29/20 0806 02/29/20 0811 02/29/20 0815 02/29/20 0821 02/29/20 0912  GLUCAP 32* 38* 45* 140* 143*    No results found for this or any previous visit (from the past 240 hour(s)).   Radiology Studies: No results found.  Marzetta Board, MD, PhD [facility]  Between 7 am - 7 pm I am available, please contact me via Amion or Securechat  Between 7 pm - 7 am I am not available, please contact night coverage MD/APP via Amion

## 2020-02-29 NOTE — Significant Event (Signed)
Rapid Response Event Note   Reason for Call :  Hypoglycemia, CBG 19  Initial Focused Assessment:  Pt lying in bed, lethargic. She opens eyes to painful stimuli. Skin is warm, dry. PERRLA, EOMI. Grips equal, moving all extremities. No distress.   VS: T 97.32F, BP 138/71, HR 60, RR 14, SpO2 95% on 2LNC  Interventions:  Pt without IV access -Glucagon IM x2  IV access established -Dextrose 50% IV, 37mL given -D10% gtt started at 68mL/hr x8 hours  Plan of Care:  CBG improved to 140 -Increase frequency of CBG checks -Frequent neuro assessments -[facility]  Call rapid response for additional needs.  Event Summary:  MD Notified: Dr. Cruzita Lederer Call Time: [id number] Arrival Time: 0805 End Time: [place], RN

## 2020-02-29 NOTE — Progress Notes (Signed)
[facility] NEPHROLOG[facility] PROGRESS NOTE  Assessment/ Plan: Pt is a 75 y.o. yo female with COPD, chronic hypoxia, hypertension, HLD, CKD admitted with shortness of breath, Covid positive and worsening renal failure.  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  Started on hemodialysis for uremic signs and symptoms along with volume overload. She has been tolerating dialysis well.  Last HD yesterday with 1.5 L UF.  Plan for next dialysis tomorrow.  Social worker is following to arrange outpatient HD placement.  Her right upper arm AV fistula infiltrated which will need evaluation as an outpatient.   Currently has right IJ [facility] for the access.    # Acute hypoxic respiratory failure: Secondary to COVID-19 infection.  Treated with remdesivir and corticosteroids with clinical improvement and decreased oxygenation requirements.  She is on oxygen at baseline for COPD.  # Metabolic encephalopathy: Likely multifactorial from underlying infection/uremia-improving with dialysis.  # Anemia: Secondary to underlying chronic kidney disease and acute/critical illness.  Continue to monitor on ESA.  # Secondary hyperparathyroidism: Phosphorus and PTH level at goal.  #Hypertension/volume: Lowered amlodipine to 5 mg nightly.  Continue Coreg.  Subjective: Seen and examined.  No issue.  Denies nausea vomiting chest pain shortness of breath. Objective Vital signs in last 24 hours: Vitals:   02/28/20 2217 02/29/20 0433 02/29/20 0750 02/29/20 0810  BP:  (!) 125/58 (!) 146/66 138/71  Pulse: 61 64 61 (!) 59  Resp: 18 19 19    Temp:  97.6 F (36.4 C) 97.8 F (36.6 C)   TempSrc:  Oral Axillary   SpO2: 91% 90% 96% 95%  Weight:      Height:       Weight change:   Intake/Output Summary (Last 24 hours) at 02/29/2020 1300 Last data filed at 02/29/2020 0900 Gross per 24 hour  Intake 240 ml  Output 1500 ml  Net -1260 ml       Labs: Basic Metabolic Panel: Recent  Labs  Lab 02/26/20 0500 02/27/20 0923 02/28/20 1605 02/29/20 1038  NA 135 131* 129* 132*  K 3.5 3.9 3.6 3.9  CL 98 96* 92* 96*  CO2 24 22 24 23   GLUCOSE 239* 145* 181* 178*  BUN 31* 41* 55* 32*  CREATININE 5.97* 7.30* 8.92* 6.41*  CALCIUM 7.3* 7.7* 7.3* 7.5*  PHOS 3.2  --  5.4*  --    Liver Function Tests: Recent Labs  Lab 02/23/20 0234 02/26/20 0500 02/27/20 0923 02/28/20 1605  AST 26  --  23  --   ALT 7  --  10  --   ALKPHOS 69  --  68  --   BILITOT 0.7  --  0.7  --   PROT 6.1*  --  5.9*  --   ALBUMIN 2.4* 2.3* 2.5* 2.5*   No results for input(s): LIPASE, AMYLASE in the last 168 hours. No results for input(s): AMMONIA in the last 168 hours. CBC: Recent Labs  Lab 02/25/20 0200 02/26/20 0500 02/27/20 0923 02/28/20 0500 02/29/20 1038  WBC 15.6* 25.3* 23.2* 16.5* 18.9*  HGB 7.0* 7.0* 9.1* 8.5* 9.3*  HCT 22.1* 23.8* 30.6* 26.8* 32.1*  MCV 65.8* 68.4* 72.3* 71.3* 75.5*  PLT 206 196 155 157 152   Cardiac Enzymes: No results for input(s): CKTOTAL, CKMB, CKMBINDEX, TROPONINI in the last 168 hours. CBG: Recent Labs  Lab 02/29/20 0821 02/29/20 0912 02/29/20 1004 02/29/20 1116 02/29/20 1225  GLUCAP 140* 143* 180* 223* 202*    Iron Studies:  No results for input(s): IRON, TIBC, TRANSFERRIN, FERRITIN in the last 72 hours. Studies/Results: DG CHEST PORT 1 VIEW  Result Date: 02/29/2020 CLINICAL DATA:  COVID-19 positive.  Hypertension. EXAM: PORTABLE CHEST 1 VIEW COMPARISON:  February 15, 2020 FINDINGS: There are areas of ill-defined opacity in each lower lung region without appreciable consolidation. Heart is mildly enlarged with pulmonary vascularity normal. No adenopathy. Central catheter tip is in the right atrium. No pneumothorax. No bone lesions. IMPRESSION: Areas of ill-defined opacity in the lower lung regions, concerning for atypical organism pneumonia in these areas. No consolidation. Stable cardiomegaly. Central catheter tip in right atrium. No pneumothorax.  Electronically Signed   By: Lowella Grip III M.D.   On: 02/29/2020 10:37    Medications: Infusions: . dextrose 50 mL/hr at 02/29/20 7416    Scheduled Medications: . amLODipine  5 mg Oral QPM  . vitamin C  500 mg Oral Daily  . aspirin EC  81 mg Oral Daily  . carvedilol  25 mg Oral BID  . Chlorhexidine Gluconate Cloth  6 each Topical Q0600  . cholecalciferol  1,000 Units Oral Daily  . darbepoetin (ARANESP) injection - DIALYSIS  150 mcg Intravenous Q Fri-HD  . donepezil  5 mg Oral QHS  . glucose      . heparin injection (subcutaneous)  7,500 Units Subcutaneous Q8H  . hydrALAZINE  100 mg Oral TID  . insulin aspart  0-5 Units Subcutaneous QHS  . insulin aspart  0-6 Units Subcutaneous TID WC  . zinc sulfate  220 mg Oral Daily    have reviewed scheduled and prn medications.  Physical Exam: General:NAD, comfortable Heart:RRR, s1s2 nl Lungs:clear b/l, no crackle Abdomen:soft, Non-tender, non-distended Extremities: Trace LE edema Dialysis Access: Right IJ TDC.  Dron Reesa Chew Bhandari 02/29/2020,1:00 PM  LOS: 14 days

## 2020-02-29 NOTE — Progress Notes (Signed)
Hypoglycemic Event  CBG: 19  Treatment: Glucagon IM 1 mg  Symptoms: Sweaty, unresponsive  Follow-up CBG: Time:803 CBG Result:21  Possible Reasons for Event: Inadequate meal intake  Comments/MD notified:   This RN was called into the room about pt CBG this am at 19. RRT paged. Due to no IV access, 1 mg glucagon IM given. As follow-up CBG was still low, another 1 mg glucagon IM given. IV team ordered STAT while IV access attempted at bedside. Dr. Cruzita Lederer paged and ordered for dextrose to be dripped into pts mouth. Third dose of glucagon IM given after CBG 45. Pt starting to respond to name, but having difficulty verbalizing. IV access obtained in R hand. Half amp of dextrose 50 given. Pt also given orange juice. D10 continuous IV ordered. Pt alert and able to answer questions. Rechecked CBG 140 at 821. Will recheck CBG q1 for next few hours per MD. Will continue to monitor.   Jeanella Craze

## 2020-02-29 NOTE — TOC Progression Note (Signed)
Transition of Care [facility]) - Progression Note    Patient Details  Name: Joann Gutierrez MRN: [id number] Date of Birth: Apr 03, 1945  Transition of Care [facility]) CM/SW [place], LCSW Phone Number: 02/29/2020, 3:00 PM  Clinical Narrative:    [facility] made CSW aware that they now may not be able to accept patient as they have too may dialysis patients. CSW will check back with them tomorrow; no other bed offers at this time.    Expected Discharge Plan: [place] Barriers to Discharge: Continued Medical Work up  Expected Discharge Plan and Services Expected Discharge Plan: [place] In-house Referral: Clinical Social Work   Post Acute Care Choice: [place] Living arrangements for the past 2 months: Single Family Home                                       Social Determinants of Health (SDOH) Interventions    Readmission Risk Interventions No flowsheet data found.

## 2020-02-29 NOTE — Plan of Care (Signed)
  Problem: Health Behavior/Discharge Planning: Goal: Ability to manage health-related needs will improve Outcome: Progressing   Problem: Clinical Measurements: Goal: Ability to maintain clinical measurements within normal limits will improve Outcome: Progressing Goal: Will remain free from infection Outcome: Progressing Goal: Diagnostic test results will improve Outcome: Progressing Goal: Respiratory complications will improve Outcome: Progressing Goal: Cardiovascular complication will be avoided Outcome: Progressing   Problem: Coping: Goal: Level of anxiety will decrease Outcome: Progressing   Problem: Elimination: Goal: Will not experience complications related to bowel motility Outcome: Progressing Goal: Will not experience complications related to urinary retention Outcome: Progressing   Problem: Coping: Goal: Psychosocial and spiritual needs will be supported Outcome: Progressing   Problem: Respiratory: Goal: Will maintain a patent airway Outcome: Progressing Goal: Complications related to the disease process, condition or treatment will be avoided or minimized Outcome: Progressing   Problem: Education: Goal: Knowledge of General Education information will improve Description: Including pain rating scale, medication(s)/side effects and non-pharmacologic comfort measures Outcome: Not Progressing   Problem: Activity: Goal: Risk for activity intolerance will decrease Outcome: Not Progressing   Problem: Nutrition: Goal: Adequate nutrition will be maintained Outcome: Not Progressing   Problem: Education: Goal: Knowledge of risk factors and measures for prevention of condition will improve Outcome: Not Progressing

## 2020-03-01 DIAGNOSIS — R600 Localized edema: Secondary | ICD-10-CM

## 2020-03-01 LAB — BASIC METABOLIC PANEL
Anion gap: 14 (ref 5–15)
BUN: 40 mg/dL — ABNORMAL HIGH (ref 8–23)
CO2: 23 mmol/L (ref 22–32)
Calcium: 7.3 mg/dL — ABNORMAL LOW (ref 8.9–10.3)
Chloride: 96 mmol/L — ABNORMAL LOW (ref 98–111)
Creatinine, Ser: 6.88 mg/dL — ABNORMAL HIGH (ref 0.44–1.00)
GFR, Estimated: 6 mL/min — ABNORMAL LOW (ref >60)
Glucose, Bld: 91 mg/dL (ref 70–99)
Potassium: 4 mmol/L (ref 3.5–5.1)
Sodium: 133 mmol/L — ABNORMAL LOW (ref 135–145)

## 2020-03-01 LAB — GLUCOSE, CAPILLARY
Glucose-Capillary: 19 mg/dL — CL (ref 70–99)
Glucose-Capillary: 32 mg/dL — CL (ref 70–99)
Glucose-Capillary: 95 mg/dL (ref 70–99)
Glucose-Capillary: 161 mg/dL — ABNORMAL HIGH (ref 70–99)
Glucose-Capillary: 139 mg/dL — ABNORMAL HIGH (ref 70–99)
Glucose-Capillary: 189 mg/dL — ABNORMAL HIGH (ref 70–99)

## 2020-03-01 LAB — CBC
HCT: 26.5 % — ABNORMAL LOW (ref 36.0–46.0)
Hemoglobin: 8.2 g/dL — ABNORMAL LOW (ref 12.0–15.0)
MCH: 22.8 pg — ABNORMAL LOW (ref 26.0–34.0)
MCHC: 30.9 g/dL (ref 30.0–36.0)
MCV: 73.6 fL — ABNORMAL LOW (ref 80.0–100.0)
Platelets: 146 10*3/uL — ABNORMAL LOW (ref 150–400)
RBC: 3.6 MIL/uL — ABNORMAL LOW (ref 3.87–5.11)
RDW: 24.7 % — ABNORMAL HIGH (ref 11.5–15.5)
WBC: 13.3 10*3/uL — ABNORMAL HIGH (ref 4.0–10.5)
nRBC: 1 % — ABNORMAL HIGH (ref 0.0–0.2)

## 2020-03-01 NOTE — Progress Notes (Signed)
[facility] NEPHROLOGY PROGRESS NOTE  Assessment/ Plan: Pt is a 75 y.o. yo female with COPD, chronic hypoxia, hypertension, HLD, CKD admitted with shortness of breath, Covid positive and worsening renal failure.  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  Started on hemodialysis for uremic signs and symptoms along with volume overload. She has been tolerating dialysis well.  Social worker is following to arrange outpatient HD placement.  Her right upper arm AV fistula infiltrated which will need evaluation as an outpatient.   Currently has right IJ [facility] for the access.   Plan for dialysis today, Covid shift.  # Acute hypoxic respiratory failure: Secondary to COVID-19 infection.  Treated with remdesivir and corticosteroids with clinical improvement and decreased oxygenation requirements.  She is on oxygen at baseline for COPD.  # Metabolic encephalopathy: Likely multifactorial from underlying infection/uremia-improving with dialysis.  # Anemia: Secondary to underlying chronic kidney disease and acute/critical illness.  Continue to monitor on ESA.  # Secondary hyperparathyroidism: Phosphorus and PTH level at goal.  #Hypertension/volume: Lowered amlodipine to 5 mg nightly.  Continue Coreg.  Subjective: Seen and examined.   Denies nausea vomiting chest pain shortness of breath.  No new event.  HD today. Objective Vital signs in last 24 hours: Vitals:   02/29/20 2026 02/29/20 2340 03/01/20 0524 03/01/20 0931  BP: (!) 109/49  (!) 118/55 (!) 111/52  Pulse: 64 60 70 63  Resp: 20 18 19 17   Temp: 97.8 F (36.6 C)  97.8 F (36.6 C) 97.9 F (36.6 C)  TempSrc: Axillary  Axillary Oral  SpO2: 97% 100% 91% 97%  Weight:      Height:       Weight change:   Intake/Output Summary (Last 24 hours) at 03/01/2020 1147 Last data filed at 03/01/2020 6213 Gross per 24 hour  Intake 779.17 ml  Output --  Net 779.17 ml       Labs: Basic Metabolic  Panel: Recent Labs  Lab 02/26/20 0500 02/27/20 0923 02/28/20 1605 02/29/20 1038 03/01/20 0101  NA 135   < > 129* 132* 133*  K 3.5   < > 3.6 3.9 4.0  CL 98   < > 92* 96* 96*  CO2 24   < > 24 23 23   GLUCOSE 086*   < > 181* 178* 91  BUN 31*   < > 55* 32* 40*  CREATININE 5.97*   < > 8.92* 6.41* 6.88*  CALCIUM 7.3*   < > 7.3* 7.5* 7.3*  PHOS 3.2  --  5.4*  --   --    < > = values in this interval not displayed.   Liver Function Tests: Recent Labs  Lab 02/26/20 0500 02/27/20 0923 02/28/20 1605  AST  --  23  --   ALT  --  10  --   ALKPHOS  --  68  --   BILITOT  --  0.7  --   PROT  --  5.9*  --   ALBUMIN 2.3* 2.5* 2.5*   No results for input(s): LIPASE, AMYLASE in the last 168 hours. No results for input(s): AMMONIA in the last 168 hours. CBC: Recent Labs  Lab 02/26/20 0500 02/27/20 0923 02/28/20 0500 02/29/20 1038 03/01/20 0101  WBC 25.3* 23.2* 16.5* 18.9* 13.3*  HGB 7.0* 9.1* 8.5* 9.3* 8.2*  HCT 23.8* 30.6* 26.8* 32.1* 26.5*  MCV 68.4* 72.3* 71.3* 75.5* 73.6*  PLT 196 155 157 152 146*   Cardiac Enzymes: No  results for input(s): CKTOTAL, CKMB, CKMBINDEX, TROPONINI in the last 168 hours. CBG: Recent Labs  Lab 02/29/20 1416 02/29/20 1702 02/29/20 2025 03/01/20 0730 03/01/20 1142  GLUCAP 251* 194* 177* 95 161*    Iron Studies: No results for input(s): IRON, TIBC, TRANSFERRIN, FERRITIN in the last 72 hours. Studies/Results: DG CHEST PORT 1 VIEW  Result Date: 02/29/2020 CLINICAL DATA:  COVID-19 positive.  Hypertension. EXAM: PORTABLE CHEST 1 VIEW COMPARISON:  February 15, 2020 FINDINGS: There are areas of ill-defined opacity in each lower lung region without appreciable consolidation. Heart is mildly enlarged with pulmonary vascularity normal. No adenopathy. Central catheter tip is in the right atrium. No pneumothorax. No bone lesions. IMPRESSION: Areas of ill-defined opacity in the lower lung regions, concerning for atypical organism pneumonia in these areas. No  consolidation. Stable cardiomegaly. Central catheter tip in right atrium. No pneumothorax. Electronically Signed   By: Lowella Grip III M.D.   On: 02/29/2020 10:37    Medications: Infusions:   Scheduled Medications: . amLODipine  5 mg Oral QPM  . vitamin C  500 mg Oral Daily  . aspirin EC  81 mg Oral Daily  . carvedilol  25 mg Oral BID  . Chlorhexidine Gluconate Cloth  6 each Topical Q0600  . Chlorhexidine Gluconate Cloth  6 each Topical Q0600  . cholecalciferol  1,000 Units Oral Daily  . darbepoetin (ARANESP) injection - DIALYSIS  150 mcg Intravenous Q Fri-HD  . donepezil  5 mg Oral QHS  . heparin injection (subcutaneous)  7,500 Units Subcutaneous Q8H  . hydrALAZINE  100 mg Oral TID  . insulin aspart  0-5 Units Subcutaneous QHS  . insulin aspart  0-6 Units Subcutaneous TID WC  . zinc sulfate  220 mg Oral Daily    have reviewed scheduled and prn medications.  Physical Exam: General:NAD, comfortable Heart:RRR, s1s2 nl Lungs:clear b/l, no crackle Abdomen:soft, Non-tender, non-distended Extremities: Trace LE edema Dialysis Access: Right IJ TDC.  Scherry Laverne Prasad Marijose Curington 03/01/2020,11:47 AM  LOS: 15 days

## 2020-03-01 NOTE — TOC Progression Note (Signed)
Transition of Care [facility]) - Progression Note    Patient Details  Name: Joann Gutierrez MRN: [id number] Date of Birth: January 09, 1946  [facility] CM/SW [place], LCSW Phone Number: 03/01/2020, 4:00 PM  Clinical Narrative:    CSW received call from [facility] They will be able to accept patient at discharge and will begin insurance authorization (though a waiver is in place). CSW made patient's son aware and he is in agreement with plan. CSW contacted [facility] to confirm dialysis acceptance. CSW spoke with Lelon Frohlich and she confirmed that patient has been accepted there for TTS at 11:40am (first appointment arrival time is 10:40am for paperwork). If she will start on a Saturday, family needs to go on Friday before 4pm to sign paperwork. CSW submitted [facility] [place] application for pre-certification.    Expected Discharge Plan: [place] Barriers to Discharge: Continued Medical Work up  Expected Discharge Plan and Services Expected Discharge Plan: [place] In-house Referral: Clinical Social Work   Post Acute Care Choice: [place] Living arrangements for the past 2 months: Single Family Home                                       Social Determinants of Health (SDOH) Interventions    Readmission Risk Interventions No flowsheet data found.

## 2020-03-01 NOTE — Progress Notes (Signed)
Per HD patient will have dialysis tonight.

## 2020-03-01 NOTE — Progress Notes (Signed)
PROGRESS NOTE    Joann Gutierrez  IEP:[id number] DOB: 1945-11-17 DOA: 02/15/2020 PCP: Andree Moro, DO   Brief Narrative:  75-year-old African-American female with a past medical history significant for but not limited to dementia, asthma, COPD, chronic hypoxic respiratory failure with 2 L home oxygen, CAD, chronic kidney disease stage V with an AV graft in place as well as history of diabetes mellitus type 2, hypertension, hyperlipidemia and obesity who came to the hospital shortness of breath.  On arrival she was saturating about 70% and she was initially required BiPAP and was transitioned to 5 L.  She is noted to be febrile at 101.8 and found to be Covid positive.  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  On 02/29/2020 she had symptomatic hypoglycemia has improved.  She is going to get dialyzed tonight.  Currently awaiting SNF placement and clip for dialysis placement  Assessment & Plan:   Principal Problem:   Pneumonia due to COVID-19 virus Active Problems:   AKI (acute kidney injury) ([place])   Sepsis ([place])   Acute on chronic respiratory failure with hypoxia (HCC)   Edema of left lower leg  Acute on Chronic Hpoxic Respiratory Failure due to COVID-19 viral pneumonia -finished Remdesivir and a course of steroids -Initially on admission due to elevated procalcitonin she has been on ceftriaxone azithromycin for 7 days, currently monitor off antibiotics -Respiratory status stable, currently on 2 L nasal cannula -SpO2: 97 % O2 Flow Rate (L/min): 2 L/min FiO2 (%): (!) 3 %  -There was concern for DVT due to left lower extremity edema and elevated D-dimer, but Doppler was negative -She tested positive on 1/25, can be taken off precautions on 2/15 -Needs SNF, placement pending, some skilled facilities can take after 14 days which is today 2/9 -Needs CLIP for Dialysis as well  Acute Hypoglycemia -This happened suddenly the morning of  2/8, patient was found unresponsive with a blood sugar of 19.  Apparently she underwent dialysis yesterday, came back to late and did not eat dinner.  She received her regular Lantus and was found to be hypoglycemic this morning.  This is a bit unusual as she has been stable over the last several days with CBGs into the 100-200s.  An IV access was obtained, she was given glucagon IM and dextrose IV, and when she was awake enough had an orange juice.  She seems back to baseline.  Would favor limited D10 infusion 50 cc/h x 8 hours (400 cc total) with every hour Accu-Cheks.  Discontinue long-acting insulin for now.  Reevaluated at bedside an hour later after initial event, alert and oriented to her baseline, eating breakfast, no complaints  Acute kidney injury on chronic kidney disease stage V, metabolic acidosis, now end-stage -Nephrology consulted, had a temporary dialysis cath placed and she is now dialysis dependent with plans to arrange outpatient hemodialysis for clip.  Lasix, spironolactone, losartan on hold -Patient to get dialyzed tonight -Further care per nephrology  Leukocytosis -Yesterday and today patient's WBC is slightly higher.  This is possibly due to steroids.  CBC this morning pending.  Given hypoglycemia raising concern for infection, repeat chest x-ray. No role for UA as she is on dialysis -WBC is trending downward from 8.9 and is now 66.0  Acute metabolic encephalopathy, underlying dementia -Worsened this morning in the setting of hypoglycemia but once that is corrected back to baseline -Appears improved, due to Covid -resume home Aricept -Her encephalopathy seems a  lot better, she is able to converse without difficulties, has some memory issues which are obvious but thought process is appropriate  Insulin-dependent diabetes mellitus -A1c 7.4, continue sliding scale -BG's ranging from 95-94  Thrombocytopenia -Due to chronic renal disease, COVID-19.  Now normalized to 152  but is now 146 today  COPD -Stable, no wheezing  Anemia due to chronic kidney disease -No bleeding, hemoglobin was 7 on 2/5, she was transfused unit of packed red blood cells, hemoglobin has remained stable -Hemoglobin/hematocrit is now 8.2/26.5 -Continue monitor for signs and symptoms of bleeding; currently no overt bleeding noted Repeat CBC in a.m.  Dementia -Continue with Donepezil  Obesity -Complicates overall prognosis and care -Estimated body mass index is 38.67 kg/m as calculated from the following:   Height as of this encounter: 5\' 7"  (1.702 m).   Weight as of this encounter: 112 kg. -Weight Loss and Dietary Counseling given    DVT prophylaxis: Heparin 7000 units subcu Code Status: FULL CODE  Family Communication: No family present at bedside  Disposition Plan: SNF   Status is: Inpatient  Remains inpatient appropriate because:Unsafe d/c plan, IV treatments appropriate due to intensity of illness or inability to take PO and Inpatient level of care appropriate due to severity of illness   Dispo: The patient is from: Home              Anticipated d/c is to: SNF              Anticipated d/c date is: 2 days              Patient currently is not medically stable to d/c.   Difficult to place patient No   Consultants:   Nephrology   Procedures: Hemodialysis  Antimicrobials:  Anti-infectives (From admission, onward)   Start     Dose/Rate Route Frequency Ordered Stop   02/17/20 0600  ceFAZolin (ANCEF) IVPB 2g/100 mL premix        2 g 200 mL/hr over 30 Minutes Intravenous On call 02/16/20 1635 02/17/20 1810   02/16/20 1000  remdesivir 100 mg in sodium chloride 0.9 % 100 mL IVPB       "Followed by" Linked Group Details   100 mg 200 mL/hr over 30 Minutes Intravenous Daily 02/15/20 2256 02/19/20 1045   02/16/20 0000  remdesivir 200 mg in sodium chloride 0.9% 250 mL IVPB       "Followed by" Linked Group Details   200 mg 580 mL/hr over 30 Minutes Intravenous Once  02/15/20 2256 02/16/20 0239   02/15/20 2115  cefTRIAXone (ROCEPHIN) 2 g in sodium chloride 0.9 % 100 mL IVPB        2 g 200 mL/hr over 30 Minutes Intravenous Every 24 hours 02/15/20 2104 02/21/20 2324   02/15/20 2115  azithromycin (ZITHROMAX) 500 mg in sodium chloride 0.9 % 250 mL IVPB        500 mg 250 mL/hr over 60 Minutes Intravenous Every 24 hours 02/15/20 2104 02/21/20 2238        Subjective: Seen and examined at bedside and she was a little lethargic but in no acute distress.  No nausea or vomiting.  Denies any chest pain or shortness of breath.  No other concerns or plans at this time.  Objective: Vitals:   02/29/20 2340 03/01/20 0524 03/01/20 0931 03/01/20 1416  BP:  (!) 118/55 (!) 111/52 (!) 117/51  Pulse: 60 70 63 68  Resp: 18 19 17 15   Temp:  97.8 F (36.6  C) 97.9 F (36.6 C) 98.1 F (36.7 C)  TempSrc:  Axillary Oral Axillary  SpO2: 100% 91% 97% 97%  Weight:      Height:        Intake/Output Summary (Last 24 hours) at 03/01/2020 1920 Last data filed at 03/01/2020 7322 Gross per 24 hour  Intake 60 ml  Output --  Net 60 ml   Filed Weights   02/25/20 1558 02/28/20 1615 02/28/20 1855  Weight: (P) 114 kg 113.6 kg 112 kg   Examination: Physical Exam:  Constitutional: Elderly obese African-American female currently no acute distress appears calm and resting Eyes: Lids and conjunctivae normal, sclerae anicteric  ENMT: External Ears, Nose appear normal. Grossly normal hearing.  Neck: Appears normal, supple, no cervical masses, normal ROM, no appreciable thyromegaly; no JVD Respiratory: Diminished to auscultation bilaterally with coarse breath sounds, no wheezing, rales, rhonchi or crackles. Normal respiratory effort and patient is not tachypenic. No accessory muscle use.  Unlabored breathing wearing supplemental oxygen via nasal cannula Cardiovascular: RRR, no murmurs / rubs / gallops. S1 and S2 auscultated.  Minimal extremity edema Abdomen: Soft, slightly tender,  distended secondary to body habitus.  Bowel sounds positive.  GU: Deferred. Musculoskeletal: No clubbing / cyanosis of digits/nails. No joint deformity upper and lower extremities.  Skin: No rashes, lesions, ulcers on limited skin evaluation. No induration; Warm and dry.  Neurologic: CN 2-12 grossly intact with no focal deficits. Romberg sign and cerebellar reflexes not assessed.  Psychiatric: Normal judgment and insight. Alert and oriented x 3. Normal mood and appropriate affect.   Data Reviewed: I have personally reviewed following labs and imaging studies  CBC: Recent Labs  Lab 02/26/20 0500 02/27/20 0923 02/28/20 0500 02/29/20 1038 03/01/20 0101  WBC 25.3* 23.2* 16.5* 18.9* 13.3*  HGB 7.0* 9.1* 8.5* 9.3* 8.2*  HCT 23.8* 30.6* 26.8* 32.1* 26.5*  MCV 68.4* 72.3* 71.3* 75.5* 73.6*  PLT 196 155 157 152 025*   Basic Metabolic Panel: Recent Labs  Lab 02/26/20 0500 02/27/20 0923 02/28/20 1605 02/29/20 1038 03/01/20 0101  NA 135 131* 129* 132* 133*  K 3.5 3.9 3.6 3.9 4.0  CL 98 96* 92* 96* 96*  CO2 24 22 24 23 23   GLUCOSE 239* 145* 181* 178* 91  BUN 31* 41* 55* 32* 40*  CREATININE 5.97* 7.30* 8.92* 6.41* 6.88*  CALCIUM 7.3* 7.7* 7.3* 7.5* 7.3*  PHOS 3.2  --  5.4*  --   --    GFR: Estimated Creatinine Clearance: 9.3 mL/min (A) (by C-G formula based on SCr of 6.88 mg/dL (H)). Liver Function Tests: Recent Labs  Lab 02/26/20 0500 02/27/20 0923 02/28/20 1605  AST  --  23  --   ALT  --  10  --   ALKPHOS  --  68  --   BILITOT  --  0.7  --   PROT  --  5.9*  --   ALBUMIN 2.3* 2.5* 2.5*   No results for input(s): LIPASE, AMYLASE in the last 168 hours. No results for input(s): AMMONIA in the last 168 hours. Coagulation Profile: No results for input(s): INR, PROTIME in the last 168 hours. Cardiac Enzymes: No results for input(s): CKTOTAL, CKMB, CKMBINDEX, TROPONINI in the last 168 hours. BNP (last 3 results) No results for input(s): PROBNP in the last 8760  hours. HbA1C: No results for input(s): HGBA1C in the last 72 hours. CBG: Recent Labs  Lab 02/29/20 1702 02/29/20 2025 03/01/20 0730 03/01/20 1142 03/01/20 1723  GLUCAP 194* 177* 95 161*  139*   Lipid Profile: No results for input(s): CHOL, HDL, LDLCALC, TRIG, CHOLHDL, LDLDIRECT in the last 72 hours. Thyroid Function Tests: No results for input(s): TSH, T4TOTAL, FREET4, T3FREE, THYROIDAB in the last 72 hours. Anemia Panel: No results for input(s): VITAMINB12, FOLATE, FERRITIN, TIBC, IRON, RETICCTPCT in the last 72 hours. Sepsis Labs: No results for input(s): PROCALCITON, LATICACIDVEN in the last 168 hours.  No results found for this or any previous visit (from the past 240 hour(s)).   RN Pressure Injury Documentation:     Estimated body mass index is 38.67 kg/m as calculated from the following:   Height as of this encounter: 5\' 7"  (1.702 m).   Weight as of this encounter: 112 kg.  Malnutrition Type:   Malnutrition Characteristics:   Nutrition Interventions:    Radiology Studies: DG CHEST PORT 1 VIEW  Result Date: 02/29/2020 CLINICAL DATA:  COVID-19 positive.  Hypertension. EXAM: PORTABLE CHEST 1 VIEW COMPARISON:  February 15, 2020 FINDINGS: There are areas of ill-defined opacity in each lower lung region without appreciable consolidation. Heart is mildly enlarged with pulmonary vascularity normal. No adenopathy. Central catheter tip is in the right atrium. No pneumothorax. No bone lesions. IMPRESSION: Areas of ill-defined opacity in the lower lung regions, concerning for atypical organism pneumonia in these areas. No consolidation. Stable cardiomegaly. Central catheter tip in right atrium. No pneumothorax. Electronically Signed   By: Lowella Grip III M.D.   On: 02/29/2020 10:37   Scheduled Meds: . amLODipine  5 mg Oral QPM  . vitamin C  500 mg Oral Daily  . aspirin EC  81 mg Oral Daily  . carvedilol  25 mg Oral BID  . Chlorhexidine Gluconate Cloth  6 each Topical  Q0600  . Chlorhexidine Gluconate Cloth  6 each Topical Q0600  . cholecalciferol  1,000 Units Oral Daily  . darbepoetin (ARANESP) injection - DIALYSIS  150 mcg Intravenous Q Fri-HD  . donepezil  5 mg Oral QHS  . heparin injection (subcutaneous)  7,500 Units Subcutaneous Q8H  . hydrALAZINE  100 mg Oral TID  . insulin aspart  0-5 Units Subcutaneous QHS  . insulin aspart  0-6 Units Subcutaneous TID WC  . zinc sulfate  220 mg Oral Daily   Continuous Infusions:   LOS: 15 days   Kerney Elbe, DO [facility] PAGER is on AMION  If 7PM-7AM, please contact night-coverage [email or web address]

## 2020-03-01 NOTE — Plan of Care (Signed)
  Problem: Health Behavior/Discharge Planning: Goal: Ability to manage health-related needs will improve Outcome: Progressing   Problem: Clinical Measurements: Goal: Will remain free from infection Outcome: Progressing   Problem: Activity: Goal: Risk for activity intolerance will decrease Outcome: Progressing   Problem: Education: Goal: Knowledge of risk factors and measures for prevention of condition will improve Outcome: Progressing   Problem: Coping: Goal: Psychosocial and spiritual needs will be supported Outcome: Progressing   Problem: Respiratory: Goal: Will maintain a patent airway Outcome: Progressing Goal: Complications related to the disease process, condition or treatment will be avoided or minimized Outcome: Progressing   Problem: Education: Goal: Knowledge of disease and its progression will improve Outcome: Progressing   Problem: Fluid Volume: Goal: Compliance with measures to maintain balanced fluid volume will improve Outcome: Progressing   Problem: Nutritional: Goal: Ability to make healthy dietary choices will improve Outcome: Progressing

## 2020-03-01 NOTE — Progress Notes (Signed)
Physical Therapy Treatment Patient Details Name: Joann Gutierrez MRN: [id number] DOB: 1945/09/29 Today's Date: 03/01/2020    History of Present Illness 75 y.o. female with a hx of chronic respiratory failure on 2L via [place] @ baseline, CHF last EF 65-70%, pulmonary hypertension, CKD, DM, COPD, anemia, hyperlipidemia, sleep apnea on CPAP @ night, & anemia who presents to the emergency department via EMS for progressively worsening shortness of breath x 6 days. Patient found to have respiratory failure 2/2 COVID PNA.    PT Comments    Patient able to tolerate short distance ambulation on room air with sats 89-92%. She did fatigue more quickly this date (noted Hgb down 9.3 to 8.2).    Follow Up Recommendations  SNF;Supervision/Assistance - 24 hour     Equipment Recommendations  Rolling walker with 5" wheels;3in1 (PT)    Recommendations for Other Services       Precautions / Restrictions Precautions Precautions: Fall Precaution Comments: watch O2    Mobility  Bed Mobility                  Transfers Overall transfer level: Needs assistance Equipment used: Rolling walker (2 wheeled) Transfers: Sit to/from Stand Sit to Stand: Min guard         General transfer comment: x 2 from different chairs  Ambulation/Gait Ambulation/Gait assistance: Min guard Gait Distance (Feet): 10 Feet (25) Assistive device: Rolling walker (2 wheeled) Gait Pattern/deviations: Step-to pattern;Decreased step length - right;Decreased step length - left;Shuffle Gait velocity: slowed   General Gait Details: vc for upright posture; pt ran into objects in her path x 2 and able to self-recover   Stairs             Wheelchair Mobility    Modified Rankin (Stroke Patients Only)       Balance Overall balance assessment: Needs assistance Sitting-balance support: No upper extremity supported;Feet supported Sitting balance-[name]: Fair     Standing balance support: Bilateral  upper extremity supported;During functional activity Standing balance-[name]: Poor Standing balance comment: requires UE support in standing                            Cognition Arousal/Alertness: Awake/alert Behavior During Therapy: WFL for tasks assessed/performed Overall Cognitive Status: History of cognitive impairments - at baseline                                 General Comments: appropriate, very appreciative      Exercises      General Comments General comments (skin integrity, edema, etc.): on room air with sats 89-92%      Pertinent Vitals/Pain Pain Assessment: Faces Faces Pain Scale: No hurt    Home Living                      Prior Function            PT Goals (current goals can now be found in the care plan section) Acute Rehab PT Goals Patient Stated Goal: to be safe Time For Goal Achievement: 03/04/20 Potential to Achieve Goals: Fair Progress towards PT goals: Progressing toward goals    Frequency    Min 3X/week      PT Plan Current plan remains appropriate    Co-evaluation              AM-PAC PT "6 Clicks" Mobility  Outcome Measure  Help needed turning from your back to your side while in a flat bed without using bedrails?: A Little Help needed moving from lying on your back to sitting on the side of a flat bed without using bedrails?: A Little Help needed moving to and from a bed to a chair (including a wheelchair)?: A Little Help needed standing up from a chair using your arms (e.g., wheelchair or bedside chair)?: A Little Help needed to walk in hospital room?: A Little Help needed climbing 3-5 steps with a railing? : Total 6 Click Score: 16    End of Session Equipment Utilized During Treatment: Gait belt Activity Tolerance: Patient limited by fatigue Patient left: with call bell/phone within reach;in chair;with chair alarm set   PT Visit Diagnosis: Unsteadiness on feet (R26.81);Muscle  weakness (generalized) (M62.81);Other abnormalities of gait and mobility (R26.89)     Time: 1281-1886 PT Time Calculation (min) (ACUTE ONLY): 33 min  Charges:  $Gait Training: 8-22 mins $Therapeutic Activity: 8-22 mins                      Arby Barrette, PT Pager [phone]    Rexanne Mano 03/01/2020, 4:40 PM

## 2020-03-02 ENCOUNTER — Inpatient Hospital Stay (HOSPITAL_COMMUNITY): Payer: Medicare HMO

## 2020-03-02 DIAGNOSIS — J449 Chronic obstructive pulmonary disease, unspecified: Secondary | ICD-10-CM

## 2020-03-02 DIAGNOSIS — E785 Hyperlipidemia, unspecified: Secondary | ICD-10-CM

## 2020-03-02 DIAGNOSIS — U071 COVID-19: Secondary | ICD-10-CM

## 2020-03-02 DIAGNOSIS — R0602 Shortness of breath: Secondary | ICD-10-CM

## 2020-03-02 DIAGNOSIS — E1169 Type 2 diabetes mellitus with other specified complication: Secondary | ICD-10-CM

## 2020-03-02 LAB — COMPREHENSIVE METABOLIC PANEL
ALT: 10 U/L (ref 0–44)
AST: 18 U/L (ref 15–41)
Albumin: 2.4 g/dL — ABNORMAL LOW (ref 3.5–5.0)
Alkaline Phosphatase: 61 U/L (ref 38–126)
Anion gap: 10 (ref 5–15)
BUN: 15 mg/dL (ref 8–23)
CO2: 27 mmol/L (ref 22–32)
Calcium: 7.2 mg/dL — ABNORMAL LOW (ref 8.9–10.3)
Chloride: 99 mmol/L (ref 98–111)
Creatinine, Ser: 3.86 mg/dL — ABNORMAL HIGH (ref 0.44–1.00)
GFR, Estimated: 12 mL/min — ABNORMAL LOW (ref >60)
Glucose, Bld: 96 mg/dL (ref 70–99)
Potassium: 3.4 mmol/L — ABNORMAL LOW (ref 3.5–5.1)
Sodium: 136 mmol/L (ref 135–145)
Total Bilirubin: 0.7 mg/dL (ref 0.3–1.2)
Total Protein: 5.6 g/dL — ABNORMAL LOW (ref 6.5–8.1)

## 2020-03-02 LAB — CBC WITH DIFFERENTIAL/PLATELET
Abs Immature Granulocytes: 0.75 10*3/uL — ABNORMAL HIGH (ref 0.00–0.07)
Basophils Absolute: 0 10*3/uL (ref 0.0–0.1)
Basophils Relative: 0 %
Eosinophils Absolute: 0 10*3/uL (ref 0.0–0.5)
Eosinophils Relative: 0 %
HCT: 26.8 % — ABNORMAL LOW (ref 36.0–46.0)
Hemoglobin: 7.9 g/dL — ABNORMAL LOW (ref 12.0–15.0)
Immature Granulocytes: 7 %
Lymphocytes Relative: 14 %
Lymphs Abs: 1.4 10*3/uL (ref 0.7–4.0)
MCH: 21.9 pg — ABNORMAL LOW (ref 26.0–34.0)
MCHC: 29.5 g/dL — ABNORMAL LOW (ref 30.0–36.0)
MCV: 74.2 fL — ABNORMAL LOW (ref 80.0–100.0)
Monocytes Absolute: 2.8 10*3/uL — ABNORMAL HIGH (ref 0.1–1.0)
Monocytes Relative: 27 %
Neutro Abs: 5.3 10*3/uL (ref 1.7–7.7)
Neutrophils Relative %: 52 %
Platelets: 132 10*3/uL — ABNORMAL LOW (ref 150–400)
RBC: 3.61 MIL/uL — ABNORMAL LOW (ref 3.87–5.11)
RDW: 25.5 % — ABNORMAL HIGH (ref 11.5–15.5)
WBC: 10.3 10*3/uL (ref 4.0–10.5)
nRBC: 0.4 % — ABNORMAL HIGH (ref 0.0–0.2)

## 2020-03-02 LAB — MAGNESIUM: Magnesium: 1.7 mg/dL (ref 1.7–2.4)

## 2020-03-02 LAB — GLUCOSE, CAPILLARY
Glucose-Capillary: 102 mg/dL — ABNORMAL HIGH (ref 70–99)
Glucose-Capillary: 192 mg/dL — ABNORMAL HIGH (ref 70–99)
Glucose-Capillary: 134 mg/dL — ABNORMAL HIGH (ref 70–99)

## 2020-03-02 LAB — PHOSPHORUS: Phosphorus: 2.7 mg/dL (ref 2.5–4.6)

## 2020-03-02 MED ORDER — ZINC SULFATE 220 (50 ZN) MG PO CAPS: 220.0000 mg | ORAL_CAPSULE | Freq: Every day | ORAL | 0 refills | Status: DC

## 2020-03-02 MED ORDER — IPRATROPIUM-ALBUTEROL 20-100 MCG/ACT IN AERS: 1.0000 | INHALATION_SPRAY | Freq: Four times a day (QID) | RESPIRATORY_TRACT | Status: DC | PRN

## 2020-03-02 MED ORDER — ASCORBIC ACID 500 MG PO TABS: 500.0000 mg | ORAL_TABLET | Freq: Every day | ORAL | Status: DC

## 2020-03-02 MED ORDER — ALBUTEROL SULFATE HFA 108 (90 BASE) MCG/ACT IN AERS: 2.0000 | INHALATION_SPRAY | RESPIRATORY_TRACT | 6 refills | Status: DC | PRN

## 2020-03-02 MED ORDER — HEPARIN SODIUM (PORCINE) 1000 UNIT/ML IJ SOLN
INTRAMUSCULAR | Status: AC
  Filled 2020-03-02: qty 4

## 2020-03-02 MED ORDER — DONEPEZIL HCL 5 MG PO TABS: 5.0000 mg | ORAL_TABLET | Freq: Every day | ORAL | Status: DC

## 2020-03-02 MED ORDER — VITAMIN D3 25 MCG PO TABS: 1000.0000 [IU] | ORAL_TABLET | Freq: Every day | ORAL | Status: DC

## 2020-03-02 MED ORDER — AMLODIPINE BESYLATE 5 MG PO TABS: 5.0000 mg | ORAL_TABLET | Freq: Every evening | ORAL | 0 refills | Status: DC

## 2020-03-02 MED ORDER — MAGNESIUM SULFATE IN D5W 1-5 GM/100ML-% IV SOLN
1.0000 g | Freq: Once | INTRAVENOUS | Status: AC
  Administered 2020-03-02: 1 g via INTRAVENOUS
  Filled 2020-03-02: qty 100

## 2020-03-02 MED ORDER — GUAIFENESIN-DM 100-10 MG/5ML PO SYRP: 10.0000 mL | ORAL_SOLUTION | ORAL | 0 refills | Status: DC | PRN

## 2020-03-02 MED ORDER — LIP MEDEX EX OINT: TOPICAL_OINTMENT | CUTANEOUS | 0 refills | Status: DC | PRN

## 2020-03-02 MED ORDER — POTASSIUM CHLORIDE CRYS ER 20 MEQ PO TBCR
40.0000 meq | EXTENDED_RELEASE_TABLET | Freq: Once | ORAL | Status: AC
  Administered 2020-03-02: 40 meq via ORAL
  Filled 2020-03-02: qty 2

## 2020-03-02 NOTE — TOC Transition Note (Addendum)
Transition of Care [facility]) - CM/SW Discharge Note   Patient Details  Name: Joann Gutierrez MRN: [id number] Date of Birth: 23-Jun-1945  [facility]) CM/SW Contact:  Benard Halsted, LCSW Phone Number: 03/02/2020, 2:13 PM   Clinical Narrative:    Patient will DC to: [facility] Anticipated DC date: 03/02/20 Family notified: Son, [name] Transport by: Corey Harold   Per MD patient ready for DC to [facility]. RN to call report prior to discharge [phone] room 220). RN, patient, patient's family, and facility notified of DC. Discharge Summary and FL2 sent to facility. DC packet on chart. Ambulance transport requested for patient.   CSW will sign off for now as social work intervention is no longer needed. Please consult [place] again if new needs arise.      Final next level of care: Skilled Nursing Facility Barriers to Discharge: Barriers Resolved   Patient Goals and CMS Choice Patient states their goals for this hospitalization and ongoing recovery are:: Rehab CMS Medicare.gov Compare Post Acute Care list provided to:: Patient Choice offered to / list presented to : Adult Children  Discharge Placement   Existing PASRR number confirmed : 03/02/20          Patient chooses bed at: [facility] Patient to be transferred to facility by: [place] Name of family member notified: Son,chester Patient and family notified of of transfer: 03/02/20  Discharge Plan and Services In-house Referral: Clinical Social Work   Post Acute Care Choice: [place]                               Social Determinants of Health (SDOH) Interventions     Readmission Risk Interventions No flowsheet data found.

## 2020-03-02 NOTE — Discharge Summary (Signed)
Physician Discharge Summary  Joann Gutierrez BJS:[id number] DOB: 1945-06-25 DOA: 02/15/2020  PCP: Andree Moro, DO  Admit date: 02/15/2020 Discharge date: 03/02/2020  Admitted From: Home Disposition: SNF  Recommendations for Outpatient Follow-up:  1. Follow up with PCP in 1-2 weeks 2. Follow up with Nephrology within 1-2 weeks 3. Repeat CXR in 3-6 weeks  4. Please obtain CMP/CBC, Mag, Phos in one week 5. Please follow up on the following pending results:  Home Health: None  Equipment/Devices: Conservation officer, nature with 5" wheels; 3in1  Discharge Condition: Stable CODE STATUS: FULL CODE   Diet recommendation: Renal/Carb Modified Diet 1200 mL  Brief/Interim Summary: The patient is a 75 year old African-American female with a past medical history significant for but not limited to dementia, asthma, COPD, chronic hypoxic respiratory failure with 2 L home oxygen, CAD, chronic kidney disease stage V with an AV graft in place as well as history of diabetes mellitus type 2, hypertension, hyperlipidemia and obesity who came to the hospital shortness of breath.  On arrival she was saturating about 70% and she was initially required BiPAP and was transitioned to 5 L.  She is noted to be febrile at 101.8 and found to be Covid positive.  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  On 02/29/2020 she had symptomatic hypoglycemia which has improved as her Insulin Regimen has been discontinued.  She got dialyzed tonight.  Currently awaiting SNF placement and clip for dialysis placement and this has taken place so she will be Discharged today.  Discharge Diagnoses:  Principal Problem:   Pneumonia due to COVID-19 virus Active Problems:   AKI (acute kidney injury) ([place])   Sepsis ([place])   Acute on chronic respiratory failure with hypoxia ([facility])   Edema of left lower leg  Acute on Chronic Hpoxic Respiratory Failure due to COVID-19 viral pneumonia -finished  Remdesivir and a course of steroids -Initially on admission due to elevated procalcitonin she has been on ceftriaxone azithromycin for 7 days, currently monitor off antibiotics -Respiratory status stable, currently on 2 L nasal cannula (Baseline) -SpO2: 90 % O2 Flow Rate (L/min): 2 L/min FiO2 (%): (!) 3 %  -There was concern for DVT due to left lower extremity edema and elevated D-dimer, but Doppler was negative -She tested positive on 1/25, can be taken off precautions on 2/15 -Needs SNF, placement procured, some skilled facilities can take after 14 days whichis today 2/9 -Needs CLIP for Dialysis as well now found and stable for D/C  Acute Hypoglycemia -This happened suddenly the morning of 2/8, patient was found unresponsive with a blood sugar of 19. Apparently she underwent dialysis yesterday, came back to late and did not eat dinner. She received her regular Lantus and was found to be hypoglycemic this morning. This is a bit unusual as she has been stable over the last several days with CBGs into the 100-200s. An IV access was obtained, she was given glucagon IM and dextrose IV, and when she was awake enough had an orange juice. She seems back to baseline. Would favor limited D10 infusion 50 cc/h x 8 hours (400 cctotal) with every hour Accu-Cheks. Discontinue long-acting insulin for now. Reevaluated at bedside an hour later after initial event, alert and oriented to her baseline, eating breakfast, no complaints -Will NOT Discharge with Insulin as recent HbA1c was 7.4 and with new Dialysis her Insulin will remain in the body longer   Acute kidney injury on chronic kidney disease stage V, metabolic acidosis,  now end-stage -Nephrology consulted, had a temporary dialysis cath placed and she is now dialysis dependent with plans to arrange outpatient hemodialysis for clip. Lasix, spironolactone, losartan on hold and will discontinue after Discussion with Dr. Carolin Sicks  -Patient got  dialyzed last night and BUN/Cr is now 15/3.86 -Further care per nephrology  Leukocytosis -Yesterday and today patient's WBC is slightly higher. This is possibly due to steroids. CBC this morning pending. Given hypoglycemia raising concern for infection, repeat chest x-ray. No role for UA as she is on dialysis -WBC is trending downward from 8.9 -> 13.3 -> 29.7  Acute metabolic encephalopathy, underlying dementia -Worsened this morning in the setting of hypoglycemia but once that is corrected back to baseline -Appears improved, due to Covid -resume home Aricept -Her encephalopathy seems a lot better, she is able to converse without difficulties, has some memory issues which are obvious but thought process is appropriate  Insulin-dependent diabetes mellitus -A1c 7.4, continue sliding scale -BG's ranging from 95-192  Thrombocytopenia -Due to chronic renal disease, COVID-19. Now normalized to 152 but is now 132 today  COPD with Chronic Hypoxic Respiratory Failure -Stable, no wheezing -C/w Supplemental O2 via [place]  Anemia due to chronic kidney disease -No bleeding, hemoglobin was 7 on 2/5, she was transfused unit of packed red blood cells,hemoglobin has remained stable -Hemoglobin/hematocrit is now 8.2/26.5 yesterday and today is 7.9/26.8 -Continue monitor for signs and symptoms of bleeding; currently no overt bleeding noted Repeat CBC in a.m.  Hypokalemia -Mild at 3.4 -Repelte with po KCl 40 mEQ x1 -Mag Level was 1.7 so replete with IV Mag Sulfate 1 gram -Continue to Monitor and Replete as Necessary   Dementia -Continue with Donepezil  Obesity -Complicates overall prognosis and care -Estimated body mass index is 38.67 kg/m as calculated from the following:   Height as of this encounter: 5\' 7"  (1.702 m).   Weight as of this encounter: 112 kg. -Weight Loss and Dietary Counseling given   Discharge Instructions  Discharge Instructions    Call MD for:  difficulty  breathing, headache or visual disturbances   Complete by: As directed    Call MD for:  extreme fatigue   Complete by: As directed    Call MD for:  hives   Complete by: As directed    Call MD for:  persistant dizziness or light-headedness   Complete by: As directed    Call MD for:  persistant nausea and vomiting   Complete by: As directed    Call MD for:  redness, tenderness, or signs of infection (pain, swelling, redness, odor or green/yellow discharge around incision site)   Complete by: As directed    Call MD for:  severe uncontrolled pain   Complete by: As directed    Call MD for:  temperature >100.4   Complete by: As directed    Diet - low sodium heart healthy   Complete by: As directed    Diet Carb Modified   Complete by: As directed    1200 mL Fluid Restriction   Discharge instructions   Complete by: As directed    You were cared for by a hospitalist during your hospital stay. If you have any questions about your discharge medications or the care you received while you were in the hospital after you are discharged, you can call the unit and ask to speak with the hospitalist on call if the hospitalist that took care of you is not available. Once you are discharged, your primary care physician  will handle any further medical issues. Please note that NO REFILLS for any discharge medications will be authorized once you are discharged, as it is imperative that you return to your primary care physician (or establish a relationship with a primary care physician if you do not have one) for your aftercare needs so that they can reassess your need for medications and monitor your lab values.  Follow up with PCP,Cardiology, and Nephrology within 1 week. Take all medications as prescribed. If symptoms change or worsen please return to the ED for evaluation   Increase activity slowly   Complete by: As directed    No wound care   Complete by: As directed      Allergies as of 03/02/2020       Reactions   Morphine And Related Other (See Comments)   Family request not to be given, reports pt does not wake up when given    Promethazine Hcl Other (See Comments)   REACTION: lethargy      Medication List    STOP taking these medications   chlorthalidone 25 MG tablet Commonly known as: HYGROTON   furosemide 80 MG tablet Commonly known as: LASIX   Levemir FlexTouch 100 UNIT/ML FlexPen Generic drug: insulin detemir   losartan 100 MG tablet Commonly known as: COZAAR   NovoLOG FlexPen 100 UNIT/ML FlexPen Generic drug: insulin aspart   promethazine 12.5 MG tablet Commonly known as: PHENERGAN   raloxifene 60 MG tablet Commonly known as: EVISTA   spironolactone 25 MG tablet Commonly known as: ALDACTONE   Vitamin D (Ergocalciferol) 1.25 MG (50000 UNIT) Caps capsule Commonly known as: DRISDOL     TAKE these medications   albuterol 108 (90 Base) MCG/ACT inhaler Commonly known as: VENTOLIN HFA Inhale 2 puffs into the lungs every 4 (four) hours as needed for wheezing or shortness of breath. What changed: when to take this   amLODipine 5 MG tablet Commonly known as: NORVASC Take 1 tablet (5 mg total) by mouth every evening. What changed:   medication strength  how much to take   ascorbic acid 500 MG tablet Commonly known as: VITAMIN C Take 1 tablet (500 mg total) by mouth daily. Start taking on: March 03, 2020   aspirin EC 81 MG tablet Take 81 mg by mouth daily.   azelastine 0.1 % nasal spray Commonly known as: ASTELIN Place 1 spray into both nostrils 2 (two) times daily as needed (allergies). Use in each nostril as directed   calcium carbonate 1500 (600 Ca) MG Tabs tablet Commonly known as: OSCAL Take 1 tablet by mouth daily.   carvedilol 25 MG tablet Commonly known as: COREG Take 25 mg by mouth 2 (two) times daily.   donepezil 5 MG tablet Commonly known as: ARICEPT Take 1 tablet (5 mg total) by mouth at bedtime. What changed:   medication  strength  how much to take   ezetimibe 10 MG tablet Commonly known as: ZETIA Take 10 mg by mouth daily.   ferrous sulfate 325 (65 FE) MG tablet Take 325 mg by mouth daily.   gabapentin 100 MG capsule Commonly known as: NEURONTIN Take 1 capsule (100 mg total) by mouth 2 (two) times daily. What changed: when to take this   guaiFENesin-dextromethorphan 100-10 MG/5ML syrup Commonly known as: ROBITUSSIN DM Take 10 mLs by mouth every 4 (four) hours as needed for cough.   hydrALAZINE 100 MG tablet Commonly known as: APRESOLINE Take 1 tablet by mouth 3 (three) times daily.  Ipratropium-Albuterol 20-100 MCG/ACT Aers respimat Commonly known as: COMBIVENT Inhale 1 puff into the lungs every 6 (six) hours as needed for wheezing or shortness of breath.   lip balm ointment Apply topically as needed for lip care.   montelukast 10 MG tablet Commonly known as: SINGULAIR Take 1 tablet (10 mg total) by mouth daily. What changed:   when to take this  reasons to take this   nitroGLYCERIN 0.4 MG SL tablet Commonly known as: NITROSTAT PLACE ONE TABLET UNDER THE TONGUE EVERY FIVE MINUTES AS NEEDED FOR CHEST PAIN What changed: See the new instructions.   omeprazole 40 MG capsule Commonly known as: PRILOSEC Take 1 capsule (40 mg total) by mouth in the morning and at bedtime. Please schedule a yearly follow up: [phone]. Thank you   OXYGEN Inhale 2 L into the lungs as needed. CPAP with oxygen at bedtime   polyethylene glycol 17 g packet Commonly known as: MiraLax Take 17 g by mouth daily as needed. What changed: reasons to take this   Systane Balance 0.6 % Soln Generic drug: Propylene Glycol Place 1 drop into both eyes daily as needed (dry eyes).   Trelegy Ellipta 100-62.5-25 MCG/INH Aepb Generic drug: Fluticasone-Umeclidin-Vilant Take 1 puff by mouth daily.   TRUEplus Pen Needles 32G X 4 MM Misc Generic drug: Insulin Pen Needle INJECT THREE TIMES A DAY AS DIRECTED   Vitamin  D3 25 MCG tablet Commonly known as: Vitamin D Take 1 tablet (1,000 Units total) by mouth daily. Start taking on: March 03, 2020   zinc sulfate 220 (50 Zn) MG capsule Take 1 capsule (220 mg total) by mouth daily. Start taking on: March 03, 2020       Contact information for follow-up providers    [facility]. Go to.   Why: Dialysis on Tuesdays, Thrusdays, Saturdays at 11:40am. Please arrive at 10:40am on the first session to complete paperwork.  Contact information: [place] [address] [phone]        [facility]. Call.   Why: Please call the agency at least 24 hours in advance to schedule rides to and from Dialysis.  Contact information: [phone]           Contact information for after-discharge care    Destination    [facility] Preferred SNF .   Service: Skilled Nursing Contact information: [address] [address] [phone]                 Allergies  Allergen Reactions  . Morphine And Related Other (See Comments)    Family request not to be given, reports pt does not wake up when given   . Promethazine Hcl Other (See Comments)    REACTION: lethargy    Consultations:  Nephrology  Procedures/Studies: DG Abd 1 View  Result Date: 02/21/2020 CLINICAL DATA:  Abdominal pain. EXAM: ABDOMEN - 1 VIEW COMPARISON:  None. FINDINGS: The bowel gas pattern is normal. No radio-opaque calculi or other significant radiographic abnormality are seen. IMPRESSION: Negative. Electronically Signed   By: Kerby Moors M.D.   On: 02/21/2020 11:28   CT HEAD WO CONTRAST  Result Date: 02/16/2020 CLINICAL DATA:  Altered mental status, somnolence EXAM: CT HEAD WITHOUT CONTRAST TECHNIQUE: Contiguous axial images were obtained from the base of the skull through the vertex without intravenous contrast. COMPARISON:  Eight/19/19 FINDINGS: Brain: Normal anatomic configuration.  Parenchymal volume loss is commensurate with the patient's age and stable since prior  examination. Stable moderate periventricular white matter changes are present likely reflecting the sequela of small vessel ischemia. Tiny remote infarct within the a left cerebellar hemisphere is again noted. No abnormal intra or extra-axial mass lesion or fluid collection. No abnormal mass effect or midline shift. No evidence of acute intracranial hemorrhage or infarct. Ventricular size is normal. Cerebellum unremarkable. Vascular: No asymmetric hyperdense vasculature at the skull base. Skull: Intact Sinuses/Orbits: Mild mucosal thickening within the right maxillary sinus. Small layering mucus within the left sphenoid sinus. Remaining paranasal sinuses are clear. Orbits are unremarkable. Other: Mastoid air cells and middle ear cavities are clear. IMPRESSION: No acute intracranial abnormality. Stable senescent changes.  Remote tiny left cerebellar infarct. Minimal paranasal sinus disease. Electronically Signed   By: Fidela Salisbury MD   On: 02/16/2020 05:32   US RENAL  Result Date: 02/16/2020 CLINICAL DATA:  Acute renal injury. EXAM: RENAL / URINARY TRACT ULTRASOUND COMPLETE COMPARISON:  Ultrasound 03/19/2019. FINDINGS: Right Kidney: Renal measurements: 9.4 x 4.5 x 4.3 cm = volume: 93.6 mL. Mild increased echogenicity cannot be excluded. No mass or hydronephrosis visualized. Left Kidney: Renal measurements: 9.1 x 4.7 x 4.2 cm = volume: 94.8 mL. Mild increased echogenicity cannot be excluded. 3.6 cm simple cyst lower pole. No hydronephrosis visualized. Bladder: Appears normal for degree of bladder distention. Other: None. IMPRESSION: 1. Mild increased echogenicity both kidneys suggesting chronic renal disease cannot be excluded. 2. 3.6 cm simple cyst lower pole left kidney. No acute abnormality. No hydronephrosis. No bladder distention. Electronically Signed   By: Marcello Moores  Register   On: 02/16/2020 05:14   IR US Guide Vasc  Access Right  Result Date: 02/18/2020 CLINICAL DATA:  Acute kidney injury, COVID-19 infection and need for hemodialysis. An indwelling AV fistula could not be used due to infiltration. EXAM: TUNNELED CENTRAL VENOUS HEMODIALYSIS CATHETER PLACEMENT WITH ULTRASOUND AND FLUOROSCOPIC GUIDANCE ANESTHESIA/SEDATION: 1.0 mg IV Versed; 25 mcg IV Fentanyl. Total Moderate Sedation Time:   15 minutes. The patient's level of consciousness and physiologic status were continuously monitored during the procedure by Radiology nursing. MEDICATIONS: 2 g IV Ancef. FLUOROSCOPY TIME:  Hiking seconds.  2.2 mGy. PROCEDURE: The procedure, risks, benefits, and alternatives were explained to the patient. Questions regarding the procedure were encouraged and answered. The patient understands and consents to the procedure. A timeout was performed prior to initiating the procedure. The right neck and chest were prepped with chlorhexidine in a sterile fashion, and a sterile drape was applied covering the operative field. Maximum barrier sterile technique with sterile gowns and gloves were used for the procedure. Local anesthesia was provided with 1% lidocaine. Ultrasound was used to confirm patency of the right internal jugular vein. After creating a small venotomy incision, a 21 gauge needle was advanced into the right internal jugular vein under direct, real-time ultrasound guidance. Ultrasound image documentation was performed. After securing guidewire access, an 8 Fr dilator was placed. A J-wire was kinked to measure appropriate catheter length. A palindrome tunneled hemodialysis catheter measuring 19 cm from tip to cuff was chosen for placement. This was tunneled in a retrograde fashion from the chest wall to the venotomy incision. At the venotomy, serial dilatation was performed and a 50 Fr peel-away sheath was placed over a guidewire. The catheter was then placed through the sheath and the sheath removed. Final catheter positioning was  confirmed and documented with a fluoroscopic spot image. The catheter was aspirated, flushed with saline, and injected with appropriate volume heparin dwells. The venotomy incision was closed  with subcuticular 4-0 Vicryl. Dermabond was applied to the incision. The catheter exit site was secured with 0-Prolene retention sutures. COMPLICATIONS: None.  No pneumothorax. FINDINGS: After catheter placement, the tip lies in the right atrium. The catheter aspirates normally and is ready for immediate use. IMPRESSION: Placement of tunneled hemodialysis catheter via the right internal jugular vein. The catheter tip lies in the right atrium. The catheter is ready for immediate use. Electronically Signed   By: Aletta Edouard M.D.   On: 02/18/2020 08:27   DG CHEST PORT 1 VIEW  Result Date: 03/02/2020 CLINICAL DATA:  Short of breath EXAM: PORTABLE CHEST 1 VIEW COMPARISON:  02/29/2020 FINDINGS: Bibasilar airspace disease with mild interval improvement. No significant effusion. Right jugular central venous catheter tip in the right atrium unchanged. No pneumothorax. IMPRESSION: Mild improvement in bibasilar airspace disease right greater than left. Electronically Signed   By: Franchot Gallo M.D.   On: 03/02/2020 08:08   DG CHEST PORT 1 VIEW  Result Date: 02/29/2020 CLINICAL DATA:  COVID-19 positive.  Hypertension. EXAM: PORTABLE CHEST 1 VIEW COMPARISON:  February 15, 2020 FINDINGS: There are areas of ill-defined opacity in each lower lung region without appreciable consolidation. Heart is mildly enlarged with pulmonary vascularity normal. No adenopathy. Central catheter tip is in the right atrium. No pneumothorax. No bone lesions. IMPRESSION: Areas of ill-defined opacity in the lower lung regions, concerning for atypical organism pneumonia in these areas. No consolidation. Stable cardiomegaly. Central catheter tip in right atrium. No pneumothorax. Electronically Signed   By: Lowella Grip III M.D.   On: 02/29/2020 10:37    DG Chest Portable 1 View  Result Date: 02/15/2020 CLINICAL DATA:  75 year old female with shortness of breath. EXAM: PORTABLE CHEST 1 VIEW COMPARISON:  Chest radiograph dated 11/20/2017 and 10/06/2017. FINDINGS: Bilateral streaky and confluent densities, right greater left, most consistent with developing infiltrate, possibly viral or atypical in etiology. Clinical correlation is recommended. No large pleural effusion pneumothorax. Mild cardiomegaly. No acute osseous pathology. IMPRESSION: Findings most consistent with developing infiltrate predominantly involving the right lung. Electronically Signed   By: Anner Crete M.D.   On: 02/15/2020 21:02   VAS [place] LOWER EXTREMITY VENOUS (DVT) (ONLY [facility] & [facility])  Result Date: 02/16/2020  Lower Venous DVT Study Other Indications: D-Dimer. Risk Factors: None identified. Limitations: Body habitus and Pt unable to tolerate any pressure on legs. Comparison Study: 11/17 Left Negative Performing Technologist: Vonzell Schlatter RVT  Examination Guidelines: A complete evaluation includes B-mode imaging, spectral Doppler, color Doppler, and power Doppler as needed of all accessible portions of each vessel. Bilateral testing is considered an integral part of a complete examination. Limited examinations for reoccurring indications may be performed as noted. The reflux portion of the exam is performed with the patient in reverse Trendelenburg.  +-----+---------------+---------+-----------+----------+-------------------+ RIGHTCompressibilityPhasicitySpontaneityPropertiesThrombus Aging      +-----+---------------+---------+-----------+----------+-------------------+ CFV  Full           Yes      Yes                                      +-----+---------------+---------+-----------+----------+-------------------+ SFJ  Full                                                             +-----+---------------+---------+-----------+----------+-------------------+  POP   Full           Yes      Yes                                      +-----+---------------+---------+-----------+----------+-------------------+ PERO                                              Not well visualized +-----+---------------+---------+-----------+----------+-------------------+ Adequate Color fill in Femoral vein and posterior tibials. Patient unable to tolerate compression  +----+---------------+---------+-----------+----------+-------------------+ LEFTCompressibilityPhasicitySpontaneityPropertiesThrombus Aging      +----+---------------+---------+-----------+----------+-------------------+ CFV Full           Yes      Yes                                      +----+---------------+---------+-----------+----------+-------------------+ SFJ Full                                                             +----+---------------+---------+-----------+----------+-------------------+ POP Full           Yes      Yes                                      +----+---------------+---------+-----------+----------+-------------------+ PTV                                              Not well visualized +----+---------------+---------+-----------+----------+-------------------+ PERO                                             Not well visualized +----+---------------+---------+-----------+----------+-------------------+ Adequate color fill of Femoral vein due to patients inability to tolerate touch or compressions. Calf veins not seen  Summary: RIGHT: - There is no evidence of deep vein thrombosis in the lower extremity. However, portions of this examination were limited- see technologist comments above.  - No cystic structure found in the popliteal fossa.  LEFT: - There is no evidence of deep vein thrombosis in the lower extremity. However, portions of this examination were limited- see technologist comments above.  - No cystic structure found in the popliteal fossa.   *See table(s) above for measurements and observations. Electronically signed by Harold Barban MD on 02/16/2020 at 9:40:57 PM.    Final    IR TUNNELED CENTRAL VENOUS CATHETER PLACEMENT  Result Date: 02/18/2020 CLINICAL DATA:  Acute kidney injury, COVID-19 infection and need for hemodialysis. An indwelling AV fistula could not be used due to infiltration. EXAM: TUNNELED CENTRAL VENOUS HEMODIALYSIS CATHETER PLACEMENT WITH ULTRASOUND AND FLUOROSCOPIC GUIDANCE ANESTHESIA/SEDATION: 1.0 mg IV Versed; 25 mcg IV Fentanyl. Total Moderate Sedation Time:   15 minutes. The patient's level of consciousness and physiologic status were continuously  monitored during the procedure by Radiology nursing. MEDICATIONS: 2 g IV Ancef. FLUOROSCOPY TIME:  [place]iking seconds.  2.2 mGy. PROCEDURE: The procedure, risks, benefits, and alternatives were explained to the patient. Questions regarding the procedure were encouraged and answered. The patient understands and consents to the procedure. A timeout was performed prior to initiating the procedure. The right neck and chest were prepped with chlorhexidine in a sterile fashion, and a sterile drape was applied covering the operative field. Maximum barrier sterile technique with sterile gowns and gloves were used for the procedure. Local anesthesia was provided with 1% lidocaine. Ultrasound was used to confirm patency of the right internal jugular vein. After creating a small venotomy incision, a 21 gauge needle was advanced into the right internal jugular vein under direct, real-time ultrasound guidance. Ultrasound image documentation was performed. After securing guidewire access, an 8 Fr dilator was placed. A J-wire was kinked to measure appropriate catheter length. A palindrome tunneled hemodialysis catheter measuring 19 cm from tip to cuff was chosen for placement. This was tunneled in a retrograde fashion from the chest wall to the venotomy incision. At the venotomy, serial dilatation was  performed and a 50 Fr peel-away sheath was placed over a guidewire. The catheter was then placed through the sheath and the sheath removed. Final catheter positioning was confirmed and documented with a fluoroscopic spot image. The catheter was aspirated, flushed with saline, and injected with appropriate volume heparin dwells. The venotomy incision was closed with subcuticular 4-0 Vicryl. Dermabond was applied to the incision. The catheter exit site was secured with 0-Prolene retention sutures. COMPLICATIONS: None.  No pneumothorax. FINDINGS: After catheter placement, the tip lies in the right atrium. The catheter aspirates normally and is ready for immediate use. IMPRESSION: Placement of tunneled hemodialysis catheter via the right internal jugular vein. The catheter tip lies in the right atrium. The catheter is ready for immediate use. Electronically Signed   By: Aletta Edouard M.D.   On: 02/18/2020 08:27    Subjective: Seen and examined at bedside and she is doing well and had no complaints.  She is little hard of hearing but feels well.  No nausea or vomiting.  Had a bowel movement.  No other concerns or complaints at this time is stable for discharge as her respiratory status remained stable.  Discharge Exam: Vitals:   03/02/20 0525 03/02/20 0734  BP: (!) 139/51 (!) 132/57  Pulse: 73 63  Resp: 17 20  Temp: 98.2 F (36.8 C) 98.6 F (37 C)  SpO2: 98% 90%   Vitals:   03/02/20 0415 03/02/20 0430 03/02/20 0525 03/02/20 0734  BP: 135/60 (!) 138/49 (!) 139/51 (!) 132/57  Pulse: 64 66 73 63  Resp: 15 18 17 20   Temp:   98.2 F (36.8 C) 98.6 F (37 C)  TempSrc:   Oral Oral  SpO2: 100% 96% 98% 90%  Weight:      Height:       General: Pt is alert, awake, not in acute distress but is Hard of Hearing  Cardiovascular: RRR, S1/S2 +, no rubs, no gallops Respiratory: Diminished bilaterally with coarse breath sounds, no wheezing, no rhonchi Abdominal: Soft, NT, Distended 2/2 body habitus, bowel  sounds + Extremities: 1+ LE edema, no cyanosis  The results of significant diagnostics from this hospitalization (including imaging, microbiology, ancillary and laboratory) are listed below for reference.    Microbiology: No results found for this or any previous visit (from the past 240 hour(s)).   Labs: BNP (  last 3 results) Recent Labs    02/15/20 2044  BNP 35.7   Basic Metabolic Panel: Recent Labs  Lab 02/26/20 0500 02/27/20 0923 02/28/20 1605 02/29/20 1038 03/01/20 0101 03/02/20 0644  NA 135 131* 129* 132* 133* 136  K 3.5 3.9 3.6 3.9 4.0 3.4*  CL 98 96* 92* 96* 96* 99  CO2 24 22 24 23 23 27   GLUCOSE 239* 145* 181* 178* 91 96  BUN 31* 41* 55* 32* 40* 15  CREATININE 5.97* 7.30* 8.92* 6.41* 6.88* 3.86*  CALCIUM 7.3* 7.7* 7.3* 7.5* 7.3* 7.2*  MG  --   --   --   --   --  1.7  PHOS 3.2  --  5.4*  --   --  2.7   Liver Function Tests: Recent Labs  Lab 02/26/20 0500 02/27/20 0923 02/28/20 1605 03/02/20 0644  AST  --  23  --  18  ALT  --  10  --  10  ALKPHOS  --  68  --  61  BILITOT  --  0.7  --  0.7  PROT  --  5.9*  --  5.6*  ALBUMIN 2.3* 2.5* 2.5* 2.4*   No results for input(s): LIPASE, AMYLASE in the last 168 hours. No results for input(s): AMMONIA in the last 168 hours. CBC: Recent Labs  Lab 02/27/20 0923 02/28/20 0500 02/29/20 1038 03/01/20 0101 03/02/20 0644  WBC 23.2* 16.5* 18.9* 13.3* 10.3  NEUTROABS  --   --   --   --  5.3  HGB 9.1* 8.5* 9.3* 8.2* 7.9*  HCT 30.6* 26.8* 32.1* 26.5* 26.8*  MCV 72.3* 71.3* 75.5* 73.6* 74.2*  PLT 155 157 152 146* 132*   Cardiac Enzymes: No results for input(s): CKTOTAL, CKMB, CKMBINDEX, TROPONINI in the last 168 hours. BNP: Invalid input(s): POCBNP CBG: Recent Labs  Lab 03/01/20 1142 03/01/20 1723 03/01/20 2049 03/02/20 0738 03/02/20 1136  GLUCAP 161* 139* 189* 102* 192*   D-Dimer No results for input(s): DDIMER in the last 72 hours. Hgb A1c No results for input(s): HGBA1C in the last 72 hours. Lipid  Profile No results for input(s): CHOL, HDL, LDLCALC, TRIG, CHOLHDL, LDLDIRECT in the last 72 hours. Thyroid function studies No results for input(s): TSH, T4TOTAL, T3FREE, THYROIDAB in the last 72 hours.  Invalid input(s): FREET3 Anemia work up No results for input(s): VITAMINB12, FOLATE, FERRITIN, TIBC, IRON, RETICCTPCT in the last 72 hours. Urinalysis    Component Value Date/Time   COLORURINE AMBER (A) 02/16/2020 1100   APPEARANCEUR CLOUDY (A) 02/16/2020 1100   LABSPEC 1.015 02/16/2020 1100   PHURINE 5.0 02/16/2020 1100   GLUCOSEU NEGATIVE 02/16/2020 1100   GLUCOSEU >=1000 01/02/2009 1101   HGBUR SMALL (A) 02/16/2020 1100   HGBUR negative 03/25/2008 1022   BILIRUBINUR NEGATIVE 02/16/2020 1100   KETONESUR NEGATIVE 02/16/2020 1100   PROTEINUR >=300 (A) 02/16/2020 1100   UROBILINOGEN 0.2 09/14/2013 1632   NITRITE NEGATIVE 02/16/2020 1100   LEUKOCYTESUR LARGE (A) 02/16/2020 1100   Sepsis Labs Invalid input(s): PROCALCITONIN,  WBC,  LACTICIDVEN Microbiology No results found for this or any previous visit (from the past 240 hour(s)).  Time coordinating discharge: 35 minutes  SIGNED:  Kerney Elbe, DO [facility] 03/02/2020, 12:20 PM Pager is on [place]  If 7PM-7AM, please contact night-coverage [email or web address]

## 2020-03-02 NOTE — Care Management Important Message (Signed)
Important Message  Patient Details  Name: Joann Gutierrez MRN: [id number] Date of Birth: 03-27-1945   Medicare Important Message Given:  Yes - Important Message mailed due to current National Emergency  Verbal consent obtained due to current National Emergency  Relationship to patient: Self Contact Name: Elisia Call Date: 03/02/20  Time: 1232 Phone: [phone] Outcome: No Answer/Busy Important Message mailed to: Patient address on file    Delorse Lek 03/02/2020, 12:32 PM

## 2020-03-02 NOTE — TOC Progression Note (Addendum)
Transition of Care [facility]) - Progression Note    Patient Details  Name: Joann Gutierrez MRN: [id number] Date of Birth: 03-Apr-1945  Transition of Care [facility]) CM/SW [place], LCSW Phone Number: 03/02/2020, 11:14 AM  Clinical Narrative:    CSW made patient's son aware of need for family to complete paperwork at the dialysis clinic tomorrow for patient to start there on Saturday. CSW also instructed son that family needs to contact [facility] to schedule patient's transports to and from [employer] to dialysis. CSW sent him a text as requested with the needed information. CSW will contact [facility] to transport patient this Saturday from [facility] at 11:25am as she will still be in her 21 day covid window and [facility] will not transport. Then [facility] can begin transporting her next Tuesday for her TTS 11:40am chair time. CSW notified Lelon Frohlich with [facility] that patient will be starting this Saturday and provided her with son's contact info.   1:52pm-CSW scheduled ride with [facility] for Saturday for patient to be at Dialysis at 11:25am. Waiver scanned in and received.    Expected Discharge Plan: [place] Barriers to Discharge: Continued Medical Work up  Expected Discharge Plan and Services Expected Discharge Plan: [place] In-house Referral: Clinical Social Work   Post Acute Care Choice: [place] Living arrangements for the past 2 months: Single Family Home                                       Social Determinants of Health (SDOH) Interventions    Readmission Risk Interventions No flowsheet data found.

## 2020-03-02 NOTE — Progress Notes (Signed)
Pt back from HD at this time. NAD noted. O2 2L [place] in place. Bed alarm on. Call bell in reach.

## 2020-03-02 NOTE — Progress Notes (Signed)
Physical Therapy Treatment Patient Details Name: Joann Gutierrez MRN: [id number] DOB: [birth year] Today's Date: 03/02/2020    History of Present Illness 75 y.o. female with a hx of chronic respiratory failure on 2L via [place] @ baseline, CHF last EF 65-70%, pulmonary hypertension, CKD, DM, COPD, anemia, hyperlipidemia, sleep apnea on CPAP @ night, & anemia who presents to the emergency department via EMS for progressively worsening shortness of breath x 6 days. Patient found to have respiratory failure 2/2 COVID PNA.    PT Comments    Patient appeared stronger today in her ambulation and was able to complete additional sit to stand exercises in addition.    Follow Up Recommendations  SNF;Supervision/Assistance - 24 hour     Equipment Recommendations  Rolling walker with 5" wheels;3in1 (PT)    Recommendations for Other Services       Precautions / Restrictions Precautions Precautions: Fall Precaution Comments: watch O2    Mobility  Bed Mobility Overal bed mobility: Needs Assistance Bed Mobility: Rolling;Sidelying to Sit Rolling: Min assist Sidelying to sit: Min assist       General bed mobility comments: tactile cues and min facilitation to initiate up to EOB    Transfers Overall transfer level: Needs assistance Equipment used: Rolling walker (2 wheeled) Transfers: Sit to/from Stand Sit to Stand: Min guard         General transfer comment: x5 reps  Ambulation/Gait Ambulation/Gait assistance: Min guard Gait Distance (Feet): 10 Feet (seated rest; 25 ft) Assistive device: Rolling walker (2 wheeled) Gait Pattern/deviations: Step-to pattern;Decreased step length - right;Decreased step length - left;Shuffle Gait velocity: slowed   General Gait Details: vc for upright posture; pt ran into objects in her path x 2 and able to self-recover   Stairs             Wheelchair Mobility    Modified Rankin (Stroke Patients Only)       Balance Overall balance  assessment: Needs assistance Sitting-balance support: No upper extremity supported;Feet supported Sitting balance-[name]: Fair     Standing balance support: During functional activity;No upper extremity supported Standing balance-[name]: Fair                              Cognition Arousal/Alertness: Awake/alert Behavior During Therapy: WFL for tasks assessed/performed Overall Cognitive Status: History of cognitive impairments - at baseline                                 General Comments: appropriate, very appreciative      Exercises Other Exercises Other Exercises: repeated sit to stands x 3 using bil UEs on armrests    General Comments General comments (skin integrity, edema, etc.): on 2l on arrival with sats 100%; on room air at rest 100%; ambulated room air down to 85%; on 2L 89%      Pertinent Vitals/Pain Pain Assessment: Faces Faces Pain Scale: No hurt    Home Living                      Prior Function            PT Goals (current goals can now be found in the care plan section) Acute Rehab PT Goals Patient Stated Goal: to be safe Time For Goal Achievement: 03/16/20 Potential to Achieve Goals: Fair Progress towards PT goals: Progressing toward goals (goals updated)  Frequency    Min 3X/week      PT Plan Current plan remains appropriate    Co-evaluation              AM-PAC PT "6 Clicks" Mobility   Outcome Measure  Help needed turning from your back to your side while in a flat bed without using bedrails?: A Little Help needed moving from lying on your back to sitting on the side of a flat bed without using bedrails?: A Little Help needed moving to and from a bed to a chair (including a wheelchair)?: A Little Help needed standing up from a chair using your arms (e.g., wheelchair or bedside chair)?: A Little Help needed to walk in hospital room?: A Little Help needed climbing 3-5 steps with a  railing? : Total 6 Click Score: 16    End of Session Equipment Utilized During Treatment: Gait belt Activity Tolerance: Patient limited by fatigue Patient left: with call bell/phone within reach;in chair;with chair alarm set Nurse Communication: Mobility status PT Visit Diagnosis: Unsteadiness on feet (R26.81);Muscle weakness (generalized) (M62.81);Other abnormalities of gait and mobility (R26.89)     Time: 4627-0350 PT Time Calculation (min) (ACUTE ONLY): 30 min  Charges:  $Therapeutic Activity: 23-37 mins                      Arby Barrette, PT Pager [phone]    Rexanne Mano 03/02/2020, 1:15 PM

## 2020-03-02 NOTE — Progress Notes (Signed)
[facility] NEPHROLOGY PROGRESS NOTE  Assessment/ Plan: Pt is a 75 y.o. yo female with COPD, chronic hypoxia, hypertension, HLD, CKD admitted with shortness of breath, Covid positive and worsening renal failure.  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  Started on hemodialysis for uremic signs and symptoms along with volume overload. She has been tolerating dialysis well.  Her right upper arm AV fistula infiltrated which will need evaluation as an outpatient.   Currently has right IJ [facility] for the access.   It seems like outpatient dialysis already arranged at [facility] TTS schedule.,  Last HD on 2/9 with 2 L UF.  Plan for next HD on Saturday, 2/12.  # Acute hypoxic respiratory failure: Secondary to COVID-19 infection.  Treated with remdesivir and corticosteroids with clinical improvement and decreased oxygenation requirements.  She is on oxygen at baseline for COPD.  # Metabolic encephalopathy: Likely multifactorial from underlying infection/uremia-improving with dialysis.  # Anemia: Secondary to underlying chronic kidney disease and acute/critical illness.  Continue to monitor on ESA.  # Secondary hyperparathyroidism: Phosphorus and PTH level at goal.  #Hypertension/volume: Lowered amlodipine to 5 mg nightly.  Continue Coreg.  Subjective: Seen and examined.  No new event.  Denies nausea vomiting chest pain shortness of breath.  Objective Vital signs in last 24 hours: Vitals:   03/02/20 0415 03/02/20 0430 03/02/20 0525 03/02/20 0734  BP: 135/60 (!) 138/49 (!) 139/51 (!) 132/57  Pulse: 64 66 73 63  Resp: 15 18 17 20   Temp:   98.2 F (36.8 C) 98.6 F (37 C)  TempSrc:   Oral Oral  SpO2: 100% 96% 98% 90%  Weight:      Height:       Weight change:   Intake/Output Summary (Last 24 hours) at 03/02/2020 1122 Last data filed at 03/02/2020 9518 Gross per 24 hour  Intake 220 ml  Output 2019 ml  Net -1799 ml        Labs: Basic Metabolic Panel: Recent Labs  Lab 02/26/20 0500 02/27/20 0923 02/28/20 1605 02/29/20 1038 03/01/20 0101 03/02/20 0644  NA 135   < > 129* 132* 133* 136  K 3.5   < > 3.6 3.9 4.0 3.4*  CL 98   < > 92* 96* 96* 99  CO2 24   < > 24 23 23 27   GLUCOSE 239*   < > 181* 178* 91 96  BUN 31*   < > 55* 32* 40* 15  CREATININE 5.97*   < > 8.92* 6.41* 6.88* 3.86*  CALCIUM 7.3*   < > 7.3* 7.5* 7.3* 7.2*  PHOS 3.2  --  5.4*  --   --  2.7   < > = values in this interval not displayed.   Liver Function Tests: Recent Labs  Lab 02/27/20 0923 02/28/20 1605 03/02/20 0644  AST 23  --  18  ALT 10  --  10  ALKPHOS 68  --  61  BILITOT 0.7  --  0.7  PROT 5.9*  --  5.6*  ALBUMIN 2.5* 2.5* 2.4*   No results for input(s): LIPASE, AMYLASE in the last 168 hours. No results for input(s): AMMONIA in the last 168 hours. CBC: Recent Labs  Lab 02/27/20 0923 02/28/20 0500 02/29/20 1038 03/01/20 0101 03/02/20 0644  WBC 23.2* 16.5* 18.9* 13.3* 10.3  NEUTROABS  --   --   --   --  5.3  HGB 9.1* 8.5* 9.3* 8.2* 7.9*  HCT 30.6* 26.8* 32.1* 26.5* 26.8*  MCV 72.3* 71.3* 75.5* 73.6* 74.2*  PLT 155 157 152 146* 132*   Cardiac Enzymes: No results for input(s): CKTOTAL, CKMB, CKMBINDEX, TROPONINI in the last 168 hours. CBG: Recent Labs  Lab 03/01/20 0730 03/01/20 1142 03/01/20 1723 03/01/20 2049 03/02/20 0738  GLUCAP 95 161* 139* 189* 102*    Iron Studies: No results for input(s): IRON, TIBC, TRANSFERRIN, FERRITIN in the last 72 hours. Studies/Results: DG CHEST PORT 1 VIEW  Result Date: 03/02/2020 CLINICAL DATA:  Short of breath EXAM: PORTABLE CHEST 1 VIEW COMPARISON:  02/29/2020 FINDINGS: Bibasilar airspace disease with mild interval improvement. No significant effusion. Right jugular central venous catheter tip in the right atrium unchanged. No pneumothorax. IMPRESSION: Mild improvement in bibasilar airspace disease right greater than left. Electronically Signed   By: Franchot Gallo M.D.   On: 03/02/2020 08:08    Medications: Infusions:   Scheduled Medications: . amLODipine  5 mg Oral QPM  . vitamin C  500 mg Oral Daily  . aspirin EC  81 mg Oral Daily  . carvedilol  25 mg Oral BID  . Chlorhexidine Gluconate Cloth  6 each Topical Q0600  . Chlorhexidine Gluconate Cloth  6 each Topical Q0600  . cholecalciferol  1,000 Units Oral Daily  . darbepoetin (ARANESP) injection - DIALYSIS  150 mcg Intravenous Q Fri-HD  . donepezil  5 mg Oral QHS  . heparin injection (subcutaneous)  7,500 Units Subcutaneous Q8H  . heparin sodium (porcine)      . hydrALAZINE  100 mg Oral TID  . insulin aspart  0-5 Units Subcutaneous QHS  . insulin aspart  0-6 Units Subcutaneous TID WC  . zinc sulfate  220 mg Oral Daily    have reviewed scheduled and prn medications.  Physical Exam: General:NAD, comfortable Heart:RRR, s1s2 nl Lungs: Clear b/l, no crackle Abdomen:soft, Non-tender, non-distended Extremities: Trace LE edema Dialysis Access: Right IJ TDC.  Dron Prasad Bhandari 03/02/2020,11:22 AM  LOS: 16 days

## 2020-03-02 NOTE — Progress Notes (Signed)
Patient was discharged to nursing home [facility]) by MD order; discharged instructions review and sent to facility via PTAR with care notes; facility was called and report was given to the nurse who is going to receive the patient; IV DIC; patient will be transported to facility via [place].

## 2020-03-02 NOTE — TOC Transition Note (Deleted)
Transition of Care [facility]) - CM/SW Discharge Note   Patient Details  Name: Joann Gutierrez MRN: [id number] Date of Birth: 1945/02/10  Transition of Care [facility]) CM/SW Contact:  Harper, [place] Phone Number: 03/02/2020, 2:19 PM   Clinical Narrative:    Patient will DC to: [employer] date: 03/02/20 Family notified: [facility], patients son [phone]) Transport by: Corey Harold   Per MD patient ready for DC to [facility]. RN to call report prior to discharge [phone]). RN, patient, patient's family, and facility notified of DC. Discharge Summary and FL2 sent to facility. DC packet on chart. Ambulance transport requested for patient.   CSW will sign off for now as social work intervention is no longer needed. Please consult [place] again if new needs arise.   Final next level of care: Skilled Nursing Facility Barriers to Discharge: Barriers Resolved   Patient Goals and CMS Choice Patient states their goals for this hospitalization and ongoing recovery are:: Rehab CMS Medicare.gov Compare Post Acute Care list provided to:: Patient Choice offered to / list presented to : Adult Children  Discharge Placement   Existing PASRR number confirmed : 03/02/20          Patient chooses bed at: [facility] Patient to be transferred to facility by: [place] Name of family member notified: Son[place] Patient and family notified of of transfer: 03/02/20  Discharge Plan and Services In-house Referral: Clinical Social Work   Post Acute Care Choice: [place]                               Social Determinants of Health (SDOH) Interventions     Readmission Risk Interventions No flowsheet data found.

## 2020-03-02 NOTE — Progress Notes (Signed)
SATURATION QUALIFICATIONS: (This note is used to comply with regulatory documentation for home oxygen)  Patient Saturations on Room Air at Rest = 100%  Patient Saturations on Room Air while Ambulating = 85%  Patient Saturations on 2 Liters of oxygen while Ambulating = 89%  Please briefly explain why patient needs home oxygen: To tolerate functional mobility/ambulation with sats 88+%   Arby Barrette, PT Pager [phone]

## 2020-03-02 NOTE — Progress Notes (Signed)
Patient left to [employer] via [employer]

## 2020-03-08 ENCOUNTER — Encounter (HOSPITAL_COMMUNITY): Payer: Self-pay | Admitting: Emergency Medicine

## 2020-03-08 ENCOUNTER — Inpatient Hospital Stay (HOSPITAL_COMMUNITY)
Admission: EM | Admit: 2020-03-08 | Discharge: 2020-03-13 | DRG: 070 | Disposition: A | Discharge status: Skilled Nursing Facility | Payer: Medicare HMO | Source: Skilled Nursing Facility | Attending: Internal Medicine | Admitting: Internal Medicine

## 2020-03-08 DIAGNOSIS — Z7951 Long term (current) use of inhaled steroids: Secondary | ICD-10-CM

## 2020-03-08 DIAGNOSIS — Z885 Allergy status to narcotic agent status: Secondary | ICD-10-CM

## 2020-03-08 DIAGNOSIS — E1143 Type 2 diabetes mellitus with diabetic autonomic (poly)neuropathy: Secondary | ICD-10-CM | POA: Diagnosis present

## 2020-03-08 DIAGNOSIS — Z992 Dependence on renal dialysis: Secondary | ICD-10-CM

## 2020-03-08 DIAGNOSIS — U071 COVID-19: Secondary | ICD-10-CM | POA: Diagnosis present

## 2020-03-08 DIAGNOSIS — Z7982 Long term (current) use of aspirin: Secondary | ICD-10-CM

## 2020-03-08 DIAGNOSIS — G4733 Obstructive sleep apnea (adult) (pediatric): Secondary | ICD-10-CM | POA: Diagnosis present

## 2020-03-08 DIAGNOSIS — D696 Thrombocytopenia, unspecified: Secondary | ICD-10-CM | POA: Diagnosis present

## 2020-03-08 DIAGNOSIS — Z560 Unemployment, unspecified: Secondary | ICD-10-CM

## 2020-03-08 DIAGNOSIS — G9341 Metabolic encephalopathy: Secondary | ICD-10-CM | POA: Diagnosis not present

## 2020-03-08 DIAGNOSIS — I10 Essential (primary) hypertension: Secondary | ICD-10-CM | POA: Diagnosis present

## 2020-03-08 DIAGNOSIS — Z888 Allergy status to other drugs, medicaments and biological substances status: Secondary | ICD-10-CM

## 2020-03-08 DIAGNOSIS — Z79899 Other long term (current) drug therapy: Secondary | ICD-10-CM

## 2020-03-08 DIAGNOSIS — Z841 Family history of disorders of kidney and ureter: Secondary | ICD-10-CM

## 2020-03-08 DIAGNOSIS — Z8673 Personal history of transient ischemic attack (TIA), and cerebral infarction without residual deficits: Secondary | ICD-10-CM

## 2020-03-08 DIAGNOSIS — E7800 Pure hypercholesterolemia, unspecified: Secondary | ICD-10-CM | POA: Diagnosis present

## 2020-03-08 DIAGNOSIS — K3184 Gastroparesis: Secondary | ICD-10-CM | POA: Diagnosis present

## 2020-03-08 DIAGNOSIS — D573 Sickle-cell trait: Secondary | ICD-10-CM | POA: Diagnosis present

## 2020-03-08 DIAGNOSIS — N2581 Secondary hyperparathyroidism of renal origin: Secondary | ICD-10-CM | POA: Diagnosis present

## 2020-03-08 DIAGNOSIS — M810 Age-related osteoporosis without current pathological fracture: Secondary | ICD-10-CM | POA: Diagnosis present

## 2020-03-08 DIAGNOSIS — E1142 Type 2 diabetes mellitus with diabetic polyneuropathy: Secondary | ICD-10-CM | POA: Diagnosis present

## 2020-03-08 DIAGNOSIS — Z8616 Personal history of COVID-19: Secondary | ICD-10-CM

## 2020-03-08 DIAGNOSIS — E785 Hyperlipidemia, unspecified: Secondary | ICD-10-CM | POA: Diagnosis present

## 2020-03-08 DIAGNOSIS — Z87891 Personal history of nicotine dependence: Secondary | ICD-10-CM

## 2020-03-08 DIAGNOSIS — D631 Anemia in chronic kidney disease: Secondary | ICD-10-CM | POA: Diagnosis present

## 2020-03-08 DIAGNOSIS — Z833 Family history of diabetes mellitus: Secondary | ICD-10-CM

## 2020-03-08 DIAGNOSIS — Z9119 Patient's noncompliance with other medical treatment and regimen: Secondary | ICD-10-CM

## 2020-03-08 DIAGNOSIS — R4182 Altered mental status, unspecified: Secondary | ICD-10-CM | POA: Diagnosis not present

## 2020-03-08 DIAGNOSIS — E1122 Type 2 diabetes mellitus with diabetic chronic kidney disease: Secondary | ICD-10-CM | POA: Diagnosis present

## 2020-03-08 DIAGNOSIS — I272 Pulmonary hypertension, unspecified: Secondary | ICD-10-CM | POA: Diagnosis present

## 2020-03-08 DIAGNOSIS — I2510 Atherosclerotic heart disease of native coronary artery without angina pectoris: Secondary | ICD-10-CM | POA: Diagnosis present

## 2020-03-08 DIAGNOSIS — I132 Hypertensive heart and chronic kidney disease with heart failure and with stage 5 chronic kidney disease, or end stage renal disease: Secondary | ICD-10-CM | POA: Diagnosis present

## 2020-03-08 DIAGNOSIS — N186 End stage renal disease: Secondary | ICD-10-CM | POA: Diagnosis present

## 2020-03-08 DIAGNOSIS — Z6838 Body mass index (BMI) 38.0-38.9, adult: Secondary | ICD-10-CM

## 2020-03-08 DIAGNOSIS — F03918 Unspecified dementia, unspecified severity, with other behavioral disturbance: Secondary | ICD-10-CM | POA: Diagnosis present

## 2020-03-08 DIAGNOSIS — Z8249 Family history of ischemic heart disease and other diseases of the circulatory system: Secondary | ICD-10-CM

## 2020-03-08 DIAGNOSIS — Z90710 Acquired absence of both cervix and uterus: Secondary | ICD-10-CM

## 2020-03-08 DIAGNOSIS — E669 Obesity, unspecified: Secondary | ICD-10-CM | POA: Diagnosis present

## 2020-03-08 DIAGNOSIS — E1169 Type 2 diabetes mellitus with other specified complication: Secondary | ICD-10-CM | POA: Diagnosis present

## 2020-03-08 DIAGNOSIS — Z9981 Dependence on supplemental oxygen: Secondary | ICD-10-CM

## 2020-03-08 DIAGNOSIS — F0391 Unspecified dementia with behavioral disturbance: Secondary | ICD-10-CM | POA: Diagnosis present

## 2020-03-08 DIAGNOSIS — G9340 Encephalopathy, unspecified: Secondary | ICD-10-CM | POA: Diagnosis present

## 2020-03-08 DIAGNOSIS — K219 Gastro-esophageal reflux disease without esophagitis: Secondary | ICD-10-CM | POA: Diagnosis present

## 2020-03-08 DIAGNOSIS — J9611 Chronic respiratory failure with hypoxia: Secondary | ICD-10-CM | POA: Diagnosis present

## 2020-03-08 DIAGNOSIS — I5032 Chronic diastolic (congestive) heart failure: Secondary | ICD-10-CM | POA: Diagnosis present

## 2020-03-08 DIAGNOSIS — Z832 Family history of diseases of the blood and blood-forming organs and certain disorders involving the immune mechanism: Secondary | ICD-10-CM

## 2020-03-08 DIAGNOSIS — J449 Chronic obstructive pulmonary disease, unspecified: Secondary | ICD-10-CM | POA: Diagnosis present

## 2020-03-08 DIAGNOSIS — Z5329 Procedure and treatment not carried out because of patient's decision for other reasons: Secondary | ICD-10-CM | POA: Diagnosis not present

## 2020-03-08 DIAGNOSIS — Z96652 Presence of left artificial knee joint: Secondary | ICD-10-CM | POA: Diagnosis present

## 2020-03-08 DIAGNOSIS — H918X3 Other specified hearing loss, bilateral: Secondary | ICD-10-CM | POA: Diagnosis present

## 2020-03-08 LAB — CBC WITH DIFFERENTIAL/PLATELET
Abs Immature Granulocytes: 0.19 10*3/uL — ABNORMAL HIGH (ref 0.00–0.07)
Basophils Absolute: 0 10*3/uL (ref 0.0–0.1)
Basophils Relative: 0 %
Eosinophils Absolute: 0 10*3/uL (ref 0.0–0.5)
Eosinophils Relative: 0 %
HCT: 26.6 % — ABNORMAL LOW (ref 36.0–46.0)
Hemoglobin: 7.5 g/dL — ABNORMAL LOW (ref 12.0–15.0)
Immature Granulocytes: 3 %
Lymphocytes Relative: 19 %
Lymphs Abs: 1.5 10*3/uL (ref 0.7–4.0)
MCH: 22.3 pg — ABNORMAL LOW (ref 26.0–34.0)
MCHC: 28.2 g/dL — ABNORMAL LOW (ref 30.0–36.0)
MCV: 79.2 fL — ABNORMAL LOW (ref 80.0–100.0)
Monocytes Absolute: 2.2 10*3/uL — ABNORMAL HIGH (ref 0.1–1.0)
Monocytes Relative: 29 %
Neutro Abs: 3.7 10*3/uL (ref 1.7–7.7)
Neutrophils Relative %: 49 %
Platelets: 95 10*3/uL — ABNORMAL LOW (ref 150–400)
RBC: 3.36 MIL/uL — ABNORMAL LOW (ref 3.87–5.11)
RDW: 24.7 % — ABNORMAL HIGH (ref 11.5–15.5)
Smear Review: NORMAL
WBC: 7.6 10*3/uL (ref 4.0–10.5)
nRBC: 0 % (ref 0.0–0.2)

## 2020-03-08 LAB — COMPREHENSIVE METABOLIC PANEL
ALT: 9 U/L (ref 0–44)
AST: 11 U/L — ABNORMAL LOW (ref 15–41)
Albumin: 2.3 g/dL — ABNORMAL LOW (ref 3.5–5.0)
Alkaline Phosphatase: 56 U/L (ref 38–126)
Anion gap: 12 (ref 5–15)
BUN: 34 mg/dL — ABNORMAL HIGH (ref 8–23)
CO2: 26 mmol/L (ref 22–32)
Calcium: 7.9 mg/dL — ABNORMAL LOW (ref 8.9–10.3)
Chloride: 102 mmol/L (ref 98–111)
Creatinine, Ser: 8.21 mg/dL — ABNORMAL HIGH (ref 0.44–1.00)
GFR, Estimated: 5 mL/min — ABNORMAL LOW (ref >60)
Glucose, Bld: 115 mg/dL — ABNORMAL HIGH (ref 70–99)
Potassium: 4.7 mmol/L (ref 3.5–5.1)
Sodium: 140 mmol/L (ref 135–145)
Total Bilirubin: 1 mg/dL (ref 0.3–1.2)
Total Protein: 6.2 g/dL — ABNORMAL LOW (ref 6.5–8.1)

## 2020-03-08 NOTE — ED Triage Notes (Signed)
Pt arrives from [place] where staff reports she has been more confused today, per her chart hx of dementia. She arrives to triage pleasantly confused she told me she [facility] and she doe snot have a name, she is laughing. She is alert not oriented, speech is clear. Clear report from ems no sure of last seen normal or her normal baseline.

## 2020-03-09 ENCOUNTER — Emergency Department (HOSPITAL_COMMUNITY): Payer: Medicare HMO

## 2020-03-09 ENCOUNTER — Encounter (HOSPITAL_COMMUNITY): Payer: Self-pay | Admitting: Internal Medicine

## 2020-03-09 DIAGNOSIS — N186 End stage renal disease: Secondary | ICD-10-CM

## 2020-03-09 DIAGNOSIS — F03918 Unspecified dementia, unspecified severity, with other behavioral disturbance: Secondary | ICD-10-CM | POA: Diagnosis present

## 2020-03-09 DIAGNOSIS — Z992 Dependence on renal dialysis: Secondary | ICD-10-CM

## 2020-03-09 DIAGNOSIS — D696 Thrombocytopenia, unspecified: Secondary | ICD-10-CM | POA: Diagnosis present

## 2020-03-09 DIAGNOSIS — F0391 Unspecified dementia with behavioral disturbance: Secondary | ICD-10-CM

## 2020-03-09 DIAGNOSIS — G9341 Metabolic encephalopathy: Secondary | ICD-10-CM | POA: Diagnosis not present

## 2020-03-09 HISTORY — DX: Dependence on renal dialysis: N18.6

## 2020-03-09 HISTORY — DX: Unspecified dementia with behavioral disturbance: F03.91

## 2020-03-09 HISTORY — DX: Dependence on renal dialysis: Z99.2

## 2020-03-09 HISTORY — DX: Unspecified dementia, unspecified severity, with other behavioral disturbance: F03.918

## 2020-03-09 LAB — RENAL FUNCTION PANEL
Albumin: 2.3 g/dL — ABNORMAL LOW (ref 3.5–5.0)
Anion gap: 10 (ref 5–15)
BUN: 40 mg/dL — ABNORMAL HIGH (ref 8–23)
CO2: 24 mmol/L (ref 22–32)
Calcium: 7.6 mg/dL — ABNORMAL LOW (ref 8.9–10.3)
Chloride: 105 mmol/L (ref 98–111)
Creatinine, Ser: 9.27 mg/dL — ABNORMAL HIGH (ref 0.44–1.00)
GFR, Estimated: 4 mL/min — ABNORMAL LOW (ref >60)
Glucose, Bld: 188 mg/dL — ABNORMAL HIGH (ref 70–99)
Phosphorus: 5.5 mg/dL — ABNORMAL HIGH (ref 2.5–4.6)
Potassium: 4.8 mmol/L (ref 3.5–5.1)
Sodium: 139 mmol/L (ref 135–145)

## 2020-03-09 LAB — URINALYSIS, ROUTINE W REFLEX MICROSCOPIC
Bilirubin Urine: NEGATIVE
Glucose, UA: NEGATIVE mg/dL
Hgb urine dipstick: NEGATIVE
Ketones, ur: NEGATIVE mg/dL
Nitrite: NEGATIVE
Protein, ur: 100 mg/dL — AB
Specific Gravity, Urine: 1.02 (ref 1.005–1.030)
pH: 5 (ref 5.0–8.0)

## 2020-03-09 LAB — CBC
HCT: 25.3 % — ABNORMAL LOW (ref 36.0–46.0)
Hemoglobin: 7.5 g/dL — ABNORMAL LOW (ref 12.0–15.0)
MCH: 23.1 pg — ABNORMAL LOW (ref 26.0–34.0)
MCHC: 29.6 g/dL — ABNORMAL LOW (ref 30.0–36.0)
MCV: 77.8 fL — ABNORMAL LOW (ref 80.0–100.0)
Platelets: 103 10*3/uL — ABNORMAL LOW (ref 150–400)
RBC: 3.25 MIL/uL — ABNORMAL LOW (ref 3.87–5.11)
RDW: 24.4 % — ABNORMAL HIGH (ref 11.5–15.5)
WBC: 5.3 10*3/uL (ref 4.0–10.5)
nRBC: 0 % (ref 0.0–0.2)

## 2020-03-09 LAB — GLUCOSE, CAPILLARY
Glucose-Capillary: 147 mg/dL — ABNORMAL HIGH (ref 70–99)
Glucose-Capillary: 172 mg/dL — ABNORMAL HIGH (ref 70–99)
Glucose-Capillary: 214 mg/dL — ABNORMAL HIGH (ref 70–99)

## 2020-03-09 LAB — CBG MONITORING, ED: Glucose-Capillary: 214 mg/dL — ABNORMAL HIGH (ref 70–99)

## 2020-03-09 MED ORDER — NEPRO/CARBSTEADY PO LIQD
237.0000 mL | Freq: Three times a day (TID) | ORAL | Status: DC | PRN
  Filled 2020-03-09: qty 237

## 2020-03-09 MED ORDER — DONEPEZIL HCL 5 MG PO TABS
5.0000 mg | ORAL_TABLET | Freq: Every day | ORAL | Status: DC
  Administered 2020-03-09 – 2020-03-12 (×3): 5 mg via ORAL
  Filled 2020-03-09 (×4): qty 1

## 2020-03-09 MED ORDER — CALCIUM CARBONATE ANTACID 1250 MG/5ML PO SUSP
500.0000 mg | Freq: Four times a day (QID) | ORAL | Status: DC | PRN
  Filled 2020-03-09: qty 5

## 2020-03-09 MED ORDER — POLYVINYL ALCOHOL 1.4 % OP SOLN: 1.0000 [drp] | Freq: Every day | OPHTHALMIC | Status: DC | PRN

## 2020-03-09 MED ORDER — HEPARIN SODIUM (PORCINE) 5000 UNIT/ML IJ SOLN: 5000.0000 [IU] | Freq: Three times a day (TID) | INTRAMUSCULAR | Status: DC

## 2020-03-09 MED ORDER — ALTEPLASE 2 MG IJ SOLR
2.0000 mg | Freq: Once | INTRAMUSCULAR | Status: AC
  Administered 2020-03-09: 1.6 mg
  Filled 2020-03-09: qty 2

## 2020-03-09 MED ORDER — ASPIRIN EC 81 MG PO TBEC
81.0000 mg | DELAYED_RELEASE_TABLET | Freq: Every day | ORAL | Status: DC
  Administered 2020-03-09: 81 mg via ORAL
  Filled 2020-03-09: qty 1

## 2020-03-09 MED ORDER — HEPARIN SODIUM (PORCINE) 1000 UNIT/ML IJ SOLN
1.6000 mL | Freq: Once | INTRAMUSCULAR | Status: AC
  Administered 2020-03-09: 1600 [IU] via INTRAVENOUS

## 2020-03-09 MED ORDER — EZETIMIBE 10 MG PO TABS
10.0000 mg | ORAL_TABLET | Freq: Every day | ORAL | Status: DC
  Administered 2020-03-09 – 2020-03-13 (×5): 10 mg via ORAL
  Filled 2020-03-09 (×6): qty 1

## 2020-03-09 MED ORDER — ONDANSETRON HCL 4 MG PO TABS: 4.0000 mg | ORAL_TABLET | Freq: Four times a day (QID) | ORAL | Status: DC | PRN

## 2020-03-09 MED ORDER — ALBUTEROL SULFATE HFA 108 (90 BASE) MCG/ACT IN AERS: 2.0000 | INHALATION_SPRAY | RESPIRATORY_TRACT | Status: DC | PRN

## 2020-03-09 MED ORDER — SODIUM CHLORIDE 0.9% FLUSH
3.0000 mL | Freq: Two times a day (BID) | INTRAVENOUS | Status: DC
  Administered 2020-03-13: 3 mL via INTRAVENOUS

## 2020-03-09 MED ORDER — IPRATROPIUM-ALBUTEROL 0.5-2.5 (3) MG/3ML IN SOLN: 3.0000 mL | Freq: Four times a day (QID) | RESPIRATORY_TRACT | Status: DC | PRN

## 2020-03-09 MED ORDER — POLYETHYLENE GLYCOL 3350 17 G PO PACK: 17.0000 g | PACK | Freq: Every day | ORAL | Status: DC | PRN

## 2020-03-09 MED ORDER — ACETAMINOPHEN 650 MG RE SUPP: 650.0000 mg | Freq: Four times a day (QID) | RECTAL | Status: DC | PRN

## 2020-03-09 MED ORDER — LIDOCAINE HCL (PF) 1 % IJ SOLN: 5.0000 mL | INTRAMUSCULAR | Status: DC | PRN

## 2020-03-09 MED ORDER — HYDROXYZINE HCL 25 MG PO TABS
25.0000 mg | ORAL_TABLET | Freq: Three times a day (TID) | ORAL | Status: DC | PRN
  Administered 2020-03-10: 25 mg via ORAL
  Filled 2020-03-09: qty 1

## 2020-03-09 MED ORDER — UMECLIDINIUM BROMIDE 62.5 MCG/INH IN AEPB
1.0000 | INHALATION_SPRAY | Freq: Every day | RESPIRATORY_TRACT | Status: DC
  Administered 2020-03-10 – 2020-03-13 (×3): 1 via RESPIRATORY_TRACT
  Filled 2020-03-09: qty 7

## 2020-03-09 MED ORDER — LIDOCAINE-PRILOCAINE 2.5-2.5 % EX CREA: 1.0000 "application " | TOPICAL_CREAM | CUTANEOUS | Status: DC | PRN

## 2020-03-09 MED ORDER — SORBITOL 70 % SOLN
30.0000 mL | Status: DC | PRN
  Filled 2020-03-09: qty 30

## 2020-03-09 MED ORDER — PROPYLENE GLYCOL 0.6 % OP SOLN: 1.0000 [drp] | Freq: Every day | OPHTHALMIC | Status: DC | PRN

## 2020-03-09 MED ORDER — CAMPHOR-MENTHOL 0.5-0.5 % EX LOTN: 1.0000 "application " | TOPICAL_LOTION | Freq: Three times a day (TID) | CUTANEOUS | Status: DC | PRN

## 2020-03-09 MED ORDER — ACETAMINOPHEN 325 MG PO TABS: 650.0000 mg | ORAL_TABLET | Freq: Four times a day (QID) | ORAL | Status: DC | PRN

## 2020-03-09 MED ORDER — FLUTICASONE FUROATE-VILANTEROL 100-25 MCG/INH IN AEPB
1.0000 | INHALATION_SPRAY | Freq: Every day | RESPIRATORY_TRACT | Status: DC
  Administered 2020-03-10 – 2020-03-13 (×3): 1 via RESPIRATORY_TRACT
  Filled 2020-03-09: qty 28

## 2020-03-09 MED ORDER — INSULIN ASPART 100 UNIT/ML ~~LOC~~ SOLN
0.0000 [IU] | Freq: Three times a day (TID) | SUBCUTANEOUS | Status: DC
  Administered 2020-03-09: 1 [IU] via SUBCUTANEOUS
  Administered 2020-03-12: 2 [IU] via SUBCUTANEOUS

## 2020-03-09 MED ORDER — FLUTICASONE-UMECLIDIN-VILANT 100-62.5-25 MCG/INH IN AEPB: 1.0000 | INHALATION_SPRAY | Freq: Every day | RESPIRATORY_TRACT | Status: DC

## 2020-03-09 MED ORDER — CHLORHEXIDINE GLUCONATE CLOTH 2 % EX PADS
6.0000 | MEDICATED_PAD | Freq: Every day | CUTANEOUS | Status: DC
  Administered 2020-03-09 – 2020-03-13 (×5): 6 via TOPICAL

## 2020-03-09 MED ORDER — HYDRALAZINE HCL 20 MG/ML IJ SOLN: 5.0000 mg | INTRAMUSCULAR | Status: DC | PRN

## 2020-03-09 MED ORDER — ZOLPIDEM TARTRATE 5 MG PO TABS: 5.0000 mg | ORAL_TABLET | Freq: Every evening | ORAL | Status: DC | PRN

## 2020-03-09 MED ORDER — IPRATROPIUM-ALBUTEROL 20-100 MCG/ACT IN AERS: 1.0000 | INHALATION_SPRAY | Freq: Four times a day (QID) | RESPIRATORY_TRACT | Status: DC | PRN

## 2020-03-09 MED ORDER — ALTEPLASE 2 MG IJ SOLR: 2.0000 mg | Freq: Once | INTRAMUSCULAR | Status: DC | PRN

## 2020-03-09 MED ORDER — PENTAFLUOROPROP-TETRAFLUOROETH EX AERO
1.0000 | INHALATION_SPRAY | CUTANEOUS | Status: DC | PRN
Start: 2020-03-09 — End: 2020-03-09

## 2020-03-09 MED ORDER — HEPARIN SODIUM (PORCINE) 1000 UNIT/ML DIALYSIS: 1000.0000 [IU] | INTRAMUSCULAR | Status: DC | PRN

## 2020-03-09 MED ORDER — PANTOPRAZOLE SODIUM 40 MG PO TBEC
40.0000 mg | DELAYED_RELEASE_TABLET | Freq: Two times a day (BID) | ORAL | Status: DC
  Administered 2020-03-09 – 2020-03-13 (×8): 40 mg via ORAL
  Filled 2020-03-09 (×9): qty 1

## 2020-03-09 MED ORDER — DOCUSATE SODIUM 100 MG PO CAPS
100.0000 mg | ORAL_CAPSULE | Freq: Two times a day (BID) | ORAL | Status: DC
  Administered 2020-03-09 – 2020-03-13 (×8): 100 mg via ORAL
  Filled 2020-03-09 (×11): qty 1

## 2020-03-09 MED ORDER — ONDANSETRON HCL 4 MG/2ML IJ SOLN: 4.0000 mg | Freq: Four times a day (QID) | INTRAMUSCULAR | Status: DC | PRN

## 2020-03-09 MED ORDER — BISACODYL 5 MG PO TBEC: 5.0000 mg | DELAYED_RELEASE_TABLET | Freq: Every day | ORAL | Status: DC | PRN

## 2020-03-09 MED ORDER — DOCUSATE SODIUM 283 MG RE ENEM
1.0000 | ENEMA | RECTAL | Status: DC | PRN
  Filled 2020-03-09: qty 1

## 2020-03-09 MED ORDER — MONTELUKAST SODIUM 10 MG PO TABS
10.0000 mg | ORAL_TABLET | Freq: Every day | ORAL | Status: DC
  Administered 2020-03-09 – 2020-03-12 (×3): 10 mg via ORAL
  Filled 2020-03-09 (×4): qty 1

## 2020-03-09 MED ORDER — SODIUM CHLORIDE 0.9 % IV SOLN: 100.0000 mL | INTRAVENOUS | Status: DC | PRN

## 2020-03-09 NOTE — ED Provider Notes (Signed)
[facility] EMERGENCY DEPARTMENT Provider Note  CSN: [id number] Arrival date & time: 03/08/20 1426  Chief Complaint(s) Altered Mental Status  HPI Joann Gutierrez is a 75 y.o. 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  She is accompanied by her significant other who reports that the patient is not at her baseline.  He reports that she normally does have fluctuating memory issues but she seems to be more sleepy and talking out of her head.  He reports that the patient was recently admitted late last month and discharged to the facility.  During her admission she had a PermCath placed and required hemodialysis.  Since being discharged the patient has been having difficulty with mobility.  During her admission the patient was diagnosed with COVID-19.  They deny any additional infections.  No known fevers.  No vomiting.  No diarrhea.  Significant other reports that the patient went to dialysis on Tuesday and was told that the catheter was clogged.  He is unsure whether she received her session of dialysis.  Reports that she is due today.  Remainder of history, ROS, and physical exam limited due to patient's condition (AMS). Additional information was obtained from significant other.   Level V Caveat.    HPI  Past Medical History Past Medical History:  Diagnosis Date  . Adenomatous colon polyp   . Allergy   . Anemia   . Asthma       . CAD (coronary artery disease)    Mild very minimal coronary disease with 20% obtuse marginal stenosis  . Carpal tunnel syndrome on left   . Cataract   . CHF (congestive heart failure) ([place])   . Chronic kidney disease (CKD), stage III (moderate) (HCC)    now stage 4  . COPD (chronic obstructive pulmonary disease)  ([place])   . Depression   . 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 Diverticulosis   . Dyspnea   . 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 Esophageal dysmotility   . Esophageal stricture   . Gastritis   . Gastroparesis 08/21/2007  . GERD (gastroesophageal reflux disease)   . Hearing loss of both ears   . Hernia, hiatal   . Hyperlipidemia   . Hypertension   . Morbid obesity ([place])   . OSTEOARTHRITIS 08/09/2006  . Osteoporosis   . Oxygen deficiency    prn   . 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 RENAL INSUFFICIENCY 02/16/2009  . 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 Seizures ([place])    pt thinks it has been several monthes since she had a seisure  . Shingles   . Sickle cell trait ([place])   . Sleep apnea    uses cpap  . Stroke [facility])    Patient Active Problem List   Diagnosis Date Noted  . COVID-19   . SOB (shortness of breath)   . Type 2 diabetes mellitus with hyperlipidemia ([place])   . Sepsis ([place]) 02/16/2020  . Acute on chronic respiratory failure with hypoxia ([place]) 02/16/2020  . Edema of left lower leg 02/16/2020  . Pneumonia due to COVID-19 virus 02/15/2020  . Acute on chronic diastolic HF (heart failure) ([place]) 11/18/2019  . Dyslipidemia 11/18/2019  . Polypharmacy 04/13/2019  . Healthcare maintenance 10/15/2017  . ARF (acute renal  failure) ([place]) 09/07/2017  . Chest pain 09/05/2017  . Ineffective health maintenance 06/26/2017  . Medication management 06/26/2017  . Retinal edema 10/07/2016  . Postherpetic neuralgia 12/21/2015  . Herpes zoster with complication 74/94/4967  . Cellulitis of face 11/27/2015  . Anemia of chronic disease 08/17/2015  . History of colonic polyps   . Hypertensive emergency 05/20/2015  . Hypokalemia 05/20/2015  . AKI (acute kidney injury) ([place])   . Esophageal dysmotility 03/01/2015  . Throat congestion 10/11/2014  . Morbid obesity due to excess calories ([place]) 09/23/2014  . Acute asthmatic bronchitis 05/20/2014  . Abdominal  pain, other specified site 09/13/2013  . Elevated liver enzymes 09/13/2013  . Cough 05/05/2013  . Pain in lower limb 03/05/2013  . DOE (dyspnea on exertion) 02/03/2013  . Chronic respiratory failure ([place]) 12/13/2012  . Stricture and stenosis of esophagus 08/31/2012  . Onychomycosis 06/01/2012  . Pain in joint, ankle and foot 06/01/2012  . Insulin dependent diabetes mellitus 04/29/2012  . Essential hypertension 07/19/2011  . CKD (chronic kidney disease), stage III ([place]) 05/19/2011  . Family history of malignant neoplasm of gastrointestinal tract 10/30/2010  . Bloating 10/16/2010  . Epigastric pain 10/16/2010  . Foot pain 09/25/2010  . Dysphagia 07/16/2010  . GERD (gastroesophageal reflux disease) 07/16/2010  . Microcytic anemia 07/16/2010  . Obstructive sleep apnea 06/16/2009  . HYPERCHOLESTEROLEMIA 02/16/2009  . 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 Gastroparesis 08/21/2007  . Chronic obstructive asthma ([place]) 08/09/2006   Home Medication(s) Prior to Admission medications   Medication Sig Start Date End Date Taking? Authorizing Provider  acetaminophen (TYLENOL) 325 MG tablet Take 650 mg by mouth every 4 (four) hours as needed for moderate pain.   Yes [provider]  albuterol (VENTOLIN HFA) 108 (90 Base) MCG/ACT inhaler Inhale 2 puffs into the lungs every 4 (four) hours as needed for wheezing or shortness of breath. 03/02/20  Yes Sheikh, Omair Latif, DO  amLODipine (NORVASC) 5 MG tablet Take 1 tablet (5 mg total) by mouth every evening. 03/02/20  Yes Sheikh, Omair Latif, DO  ascorbic acid (VITAMIN C) 500 MG tablet Take 1 tablet (500 mg total) by mouth daily. 03/03/20  Yes Sheikh, Omair Latif, DO  aspirin EC 81 MG tablet Take 81 mg by mouth daily.   Yes [provider]  azelastine (ASTELIN) 0.1 % nasal spray Place 1 spray into both nostrils 2 (two) times daily as needed (allergies). Use in each nostril as directed 11/10/19  Yes [name], MD  calcium carbonate  (OSCAL) 1500 (600 Ca) MG TABS tablet Take 1 tablet by mouth daily.   Yes [provider]  carvedilol (COREG) 25 MG tablet Take 25 mg by mouth 2 (two) times daily. 01/09/19  Yes [provider]  cholecalciferol (VITAMIN D) 25 MCG tablet Take 1 tablet (1,000 Units total) by mouth daily. 03/03/20  Yes Sheikh, Omair Latif, DO  donepezil (ARICEPT) 5 MG tablet Take 1 tablet (5 mg total) by mouth at bedtime. 03/02/20  Yes Sheikh, Omair Latif, DO  ezetimibe (ZETIA) 10 MG tablet Take 10 mg by mouth daily.   Yes [provider]  ferrous sulfate 325 (65 FE) MG tablet Take 325 mg by mouth daily.   Yes [provider]  Fluticasone-Umeclidin-Vilant (TRELEGY ELLIPTA) 100-62.5-25 MCG/INH AEPB Take 1 puff by mouth daily. 04/13/19  Yes Lauraine Rinne, NP  gabapentin (NEURONTIN) 100 MG capsule Take 1 capsule (100 mg total) by mouth 2 (two) times daily. Patient taking differently: Take 100 mg by mouth  at bedtime. 09/12/17  Yes Hongalgi, Lenis Dickinson, MD  guaiFENesin-dextromethorphan (ROBITUSSIN DM) 100-10 MG/5ML syrup Take 10 mLs by mouth every 4 (four) hours as needed for cough. 03/02/20  Yes Sheikh, Omair Latif, DO  hydrALAZINE (APRESOLINE) 100 MG tablet Take 1 tablet by mouth 3 (three) times daily. 05/10/19  Yes [provider]  Ipratropium-Albuterol (COMBIVENT) 20-100 MCG/ACT AERS respimat Inhale 1 puff into the lungs every 6 (six) hours as needed for wheezing or shortness of breath. 03/02/20  Yes Sheikh, Omair Latif, DO  lip balm (CARMEX) ointment Apply topically as needed for lip care. 03/02/20  Yes Sheikh, Omair Latif, DO  montelukast (SINGULAIR) 10 MG tablet Take 1 tablet (10 mg total) by mouth daily. Patient taking differently: Take 10 mg by mouth at bedtime. 10/22/17  Yes Lauraine Rinne, NP  nitroGLYCERIN (NITROSTAT) 0.4 MG SL tablet PLACE ONE TABLET UNDER THE TONGUE EVERY FIVE MINUTES AS NEEDED FOR CHEST PAIN Patient taking differently: Place 0.4 mg under the tongue every 5 (five)  minutes as needed for chest pain. 07/08/16  Yes Minus Breeding, MD  omeprazole (PRILOSEC) 40 MG capsule Take 1 capsule (40 mg total) by mouth in the morning and at bedtime. Please schedule a yearly follow up: [phone]. Thank you 09/01/19  Yes Armbruster, Carlota Raspberry, MD  OXYGEN Inhale 2 L into the lungs as needed. CPAP with oxygen at bedtime   Yes [provider]  polyethylene glycol (MIRALAX) 17 g packet Take 17 g by mouth daily as needed. Patient taking differently: Take 17 g by mouth daily as needed (constipation.). 06/18/18  Yes Armbruster, Carlota Raspberry, MD  Propylene Glycol (SYSTANE BALANCE) 0.6 % SOLN Place 1 drop into both eyes daily as needed (dry eyes).   Yes [provider]  TRUEPLUS PEN NEEDLES 32G X 4 MM MISC INJECT THREE TIMES A DAY AS DIRECTED 07/08/17  Yes [provider]  zinc sulfate 220 (50 Zn) MG capsule Take 1 capsule (220 mg total) by mouth daily. 03/03/20  Yes Kerney Elbe, DO                                                                                                                                    Past Surgical History Past Surgical History:  Procedure Laterality Date  . ABDOMINAL HYSTERECTOMY    . ARTERY BIOPSY  01/07/2011   Procedure: MINOR BIOPSY TEMPORAL ARTERY;  Surgeon: Haywood Lasso, MD;  Location: [facility];  Service: General;  Laterality: Left;  left temporal artery biopsy  . AV FISTULA PLACEMENT Right 11/16/2018   Procedure: CREATION RIGHT BRACHIOCEPHALIC FISTULA  ARTERIOVENOUS FISTULA;  Surgeon: Angelia Mould, MD;  Location: [facility];  Service: Vascular;  Laterality: Right;  . bil foot surgery    . BREAST LUMPECTOMY     benign  . BREAST LUMPECTOMY     both breast lumps removed   . CATARACT EXTRACTION Left 06/2016  Dr. Read Drivers  . COLONOSCOPY    . COLONOSCOPY WITH PROPOFOL N/A 07/05/2015   Procedure: COLONOSCOPY WITH PROPOFOL;  Surgeon: Manus Gunning, MD;  Location: [facility];  Service:  Gastroenterology;  Laterality: N/A;  . COLONOSCOPY WITH PROPOFOL N/A 08/04/2018   Procedure: COLONOSCOPY WITH PROPOFOL;  Surgeon: Yetta Flock, MD;  Location: [facility];  Service: Gastroenterology;  Laterality: N/A;  . ERD  08/08/2000  . ESOPHAGEAL MANOMETRY N/A 03/13/2015   Procedure: ESOPHAGEAL MANOMETRY (EM);  Surgeon: Manus Gunning, MD;  Location: [facility];  Service: Gastroenterology;  Laterality: N/A;  . ESOPHAGOGASTRODUODENOSCOPY  06/25/2006  . ESOPHAGOGASTRODUODENOSCOPY (EGD) WITH PROPOFOL N/A 07/05/2015   Procedure: ESOPHAGOGASTRODUODENOSCOPY (EGD) WITH PROPOFOL;  Surgeon: Manus Gunning, MD;  Location: [facility];  Service: Gastroenterology;  Laterality: N/A;  . FISTULA SUPERFICIALIZATION Right 11/16/2018   Procedure: Fistula Superficialization;  Surgeon: Angelia Mould, MD;  Location: [facility];  Service: Vascular;  Laterality: Right;  . HERNIA REPAIR    . IR PERC TUN PERIT CATH WO PORT S&I [facility]  02/17/2020  . IR US GUIDE VASC ACCESS RIGHT  02/18/2020  . LEFT AND RIGHT HEART CATHETERIZATION WITH CORONARY ANGIOGRAM N/A 03/03/2013   Procedure: LEFT AND RIGHT HEART CATHETERIZATION WITH CORONARY ANGIOGRAM;  Surgeon: Minus Breeding, MD;  Location: [facility] C[facility] LAB;  Service: Cardiovascular;  Laterality: N/A;  . REPLACEMENT TOTAL KNEE Left 1998  . UPPER GASTROINTESTINAL ENDOSCOPY     Family History Family History  Problem Relation Age of Onset  . Liver cancer Mother        Liver Cancer  . Diabetes Mother   . Kidney disease Mother   . Heart disease Mother        age 27's  . Heart disease Father 33       MI  . Heart attack Father        died of MI when pt was 47  . Sickle cell anemia Father   . Colon cancer Brother   . Cancer Brother        Colon Cancer  . Diabetes Sister   . Kidney disease Sister   . Heart disease Sister        age 58's  . Allergies Sister   . Diabetes Sister   . Kidney disease Sister   . Heart disease Sister        age 38's   . Esophageal cancer Neg Hx   . Rectal cancer Neg Hx   . Stomach cancer Neg Hx   . Amblyopia Neg Hx   . Blindness Neg Hx   . Glaucoma Neg Hx   . Macular degeneration Neg Hx   . Retinal detachment Neg Hx   . Cataracts Neg Hx   . Strabismus Neg Hx   . Retinitis pigmentosa Neg Hx     Social History Social History   Tobacco Use  . Smoking status: Former Smoker    Packs/day: 0.50    Years: 10.00    Pack years: 5.00    Quit date: 04/18/1980    Years since quitting: 39.9  . Smokeless tobacco: Never Used  Vaping Use  . Vaping Use: Never used  Substance Use Topics  . Alcohol use: No    Alcohol/week: 0.0 standard drinks  . Drug use: No   Allergies Morphine and related and Promethazine hcl  Review of Systems Review of Systems  Unable to perform ROS: Mental status change    Physical Exam Vital Signs  I have reviewed the  triage vital signs BP (!) 115/52   Pulse 65   Temp 97.6 F (36.4 C) (Oral)   Resp 19   SpO2 99%   Physical Exam Vitals reviewed.  Constitutional:      General: She is not in acute distress.    Appearance: She is well-developed and well-nourished. She is not diaphoretic.  HENT:     Head: Normocephalic and atraumatic.     Nose: Nose normal.  Eyes:     General: No scleral icterus.       Right eye: No discharge.        Left eye: No discharge.     Extraocular Movements: EOM normal.     Conjunctiva/sclera: Conjunctivae normal.     Pupils: Pupils are equal, round, and reactive to light.  Cardiovascular:     Rate and Rhythm: Normal rate and regular rhythm.     Heart sounds: No murmur heard. No friction rub. No gallop.   Pulmonary:     Effort: Pulmonary effort is normal. No respiratory distress.     Breath sounds: Normal breath sounds. No stridor. No rales.  Chest:    Abdominal:     General: There is no distension.     Palpations: Abdomen is soft.     Tenderness: There is no abdominal tenderness.  Musculoskeletal:        General: No tenderness  or edema.     Cervical back: Normal range of motion and neck supple.     Comments: Lymphedema to BLE, relatively symmetric  Skin:    General: Skin is warm and dry.     Findings: No erythema or rash.  Neurological:     Mental Status: She is disoriented and confused.     Comments: Sleepy but arousable  Psychiatric:        Mood and Affect: Mood and affect normal.     ED Results and Treatments Labs (all labs ordered are listed, but only abnormal results are displayed) Labs Reviewed  CBC WITH DIFFERENTIAL/PLATELET - Abnormal; Notable for the following components:      Result Value   RBC 3.36 (*)    Hemoglobin 7.5 (*)    HCT 26.6 (*)    MCV 79.2 (*)    MCH 22.3 (*)    MCHC 28.2 (*)    RDW 24.7 (*)    Platelets 95 (*)    Monocytes Absolute 2.2 (*)    Abs Immature Granulocytes 0.19 (*)    All other components within normal limits  COMPREHENSIVE METABOLIC PANEL - Abnormal; Notable for the following components:   Glucose, Bld 115 (*)    BUN 34 (*)    Creatinine, Ser 8.21 (*)    Calcium 7.9 (*)    Total Protein 6.2 (*)    Albumin 2.3 (*)    AST 11 (*)    GFR, Estimated 5 (*)    All other components within normal limits  CBG MONITORING, ED - Abnormal; Notable for the following components:   Glucose-Capillary 214 (*)    All other components within normal limits  URINALYSIS, ROUTINE W REFLEX MICROSCOPIC  I-STAT VENOUS BLOOD GAS, ED  EKG  EKG Interpretation  Date/Time:  Thursday March 09 2020 03:23:47 EST Ventricular Rate:  66 PR Interval:    QRS Duration: 88 QT Interval:  410 QTC Calculation: 430 R Axis:   11 Text Interpretation: Sinus rhythm Probable left atrial enlargement Anterior infarct, old No acute changes Confirmed by Addison Lank [phone]) on 03/09/2020 4:01:12 AM      Radiology CT Head Wo Contrast  Result Date: 03/09/2020 CLINICAL DATA:   Mental status change, unknown cause EXAM: CT HEAD WITHOUT CONTRAST TECHNIQUE: Contiguous axial images were obtained from the base of the skull through the vertex without intravenous contrast. COMPARISON:  Head CT 02/16/2020 FINDINGS: Brain: Stable degree of atrophy and chronic small vessel ischemia. No intracranial hemorrhage, mass effect, or midline shift. No hydrocephalus. The basilar cisterns are patent. No evidence of territorial infarct or acute ischemia. No extra-axial or intracranial fluid collection. Vascular: Atherosclerosis of skullbase vasculature without hyperdense vessel or abnormal calcification. Skull: No fracture or focal lesion. Sinuses/Orbits: Similar fluid level in left side of sphenoid sinus. Mild mucosal thickening of maxillary sinuses. Mastoid air cells are clear. No acute orbital abnormality. Other: None. IMPRESSION: 1. No acute intracranial abnormality. 2. Stable atrophy and chronic small vessel ischemia. Electronically Signed   By: Keith Rake M.D.   On: 03/09/2020 04:00   DG Chest Port 1 View  Result Date: 03/09/2020 CLINICAL DATA:  Altered mental status EXAM: PORTABLE CHEST 1 VIEW COMPARISON:  One week ago FINDINGS: Cardiomegaly and vascular pedicle widening. Subpleural predominant airspace opacity greater to the right where there are lower lung volumes. Dialysis catheter with tip at the right atrium. Artifact from EKG leads IMPRESSION: Patchy bilateral pneumonia similar to 1 week prior. Electronically Signed   By: Monte Fantasia M.D.   On: 03/09/2020 04:02    Pertinent labs & imaging results that were available during my care of the patient were reviewed by me and considered in my medical decision making (see chart for details).  Medications Ordered in ED Medications  heparin sodium (porcine) injection 1,600 Units (1,600 Units Intravenous Given 03/09/20 0457)  alteplase (CATHFLO ACTIVASE) injection 2 mg (1.6 mg Intracatheter Given 03/09/20 0529)                                                                                                                                     Procedures Procedures  (including critical care time)  Medical Decision Making / ED Course I have reviewed the nursing notes for this encounter and the patient's prior records (if available in EHR or on provided paperwork).   Joann Gutierrez was evaluated in Emergency Department on 03/09/2020 for the symptoms described in the history of present illness. She was evaluated in the context of the global COVID-19 pandemic, which necessitated consideration that the patient might be at risk for infection with the SARS-CoV-2 virus that causes COVID-19. Institutional protocols and algorithms that pertain to the evaluation of patients  at risk for COVID-19 are in a state of rapid change based on information released by regulatory bodies including the CDC and federal and state organizations. These policies and algorithms were followed during the patient's care in the ED.  Altered mental status of unknown etiology. Significant other at bedside reporting that this is not the patient's baseline. She is currently afebrile with stable vital signs.  Labs without leukocytosis.  Did reveal downtrending hemoglobin likely from chronic kidney disease. Patient's renal function is worsening but does not require emergent dialysis at this time. It was reported as the patient's PermCath was clogged the IV team inspected and confirmed that one of the ports was clotted.  Alteplase was placed. CT head negative. Chest x-ray unchanged from a week ago from Covid infection.  On review of records, patient had urine during her last admission that grew out 20,000 CFU's of E. Coli.  Per patient significant other patient should still be able to void.  After several hours patient had not voided thus a bladder scan was obtained and showed only 70 cc of urine in her bladder.  Will obtain in and out for sample.  Patient will  require admission for further work-up and management of her altered mental status.  During admission she will require dialysis. Spoke to Dr. Alcario Drought from hospitalist service who will have the morning team evaluate patient for admission.      Final Clinical Impression(s) / ED Diagnoses Final diagnoses:  Altered mental status      This chart was dictated using voice recognition software.  Despite best efforts to proofread,  errors can occur which can change the documentation meaning.   Fatima Blank, MD 03/09/20 [phone]

## 2020-03-09 NOTE — Progress Notes (Signed)
Pt arrived to 3W, Rm 26 via stretcher, transferred to unit bed with assistance. Pt is stable and resting in bed with boyfriend at bedside.

## 2020-03-09 NOTE — H&P (Addendum)
History and Physical    Joann Gutierrez GNF:[id number] DOB: February 18, 1945 DOA: 03/08/2020  PCP: Andree Moro, DO Consultants:  [facility] - pulmonology; [facility] - cardiology; Scot Dock - vascular surgery; nephrology Patient coming from: [place]; NOK: Dan Europe [place], [phone]; [name], [place]   Chief Complaint: AMS  HPI: Joann Gutierrez is a 75 y.o. 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  She was previously hospitalized from 1/25-2/10 with COVID PNA, treated with steroids/remdesivir/antibiotics and transition to ESRD on HD.  She appeared to have waxing and waning mental status during that hospitalization.  She has not received HD since her prior hospitalization.  She was sent yesterday to the ER for worsening confusion.  She has been delirious but also excessively somnolent and not interactive.  She did not receive HD on Saturday due to a malfunctioning TDC.  The patient is somnolent and awakens to loud voice/touch but interacts little and answers few questions.  I spoke with her daughter.  Her graft was clogged and so they placed a TDC.  She started HD in the hospital.  She hasn't been on CPAP at night.  She went to SNF and some HD.  Her aunt was concerned that she might have had a stroke - her R arm with the graft was dropping.  She was "a little confused."   ED Course:   Carryover, per Dr. Alcario Drought:  75 yo F here for AMS, confusion - likely delirium on top of dementia.  Was just admitted Jan-Feb 10th with COVID and renal failure, needed dialysis, has perm cath.  Went to dialysis on Tues. Sounds like catheter was clogged, IV team just put TPA into it now in ED. Needs dialysis today. About to get I+O cath, only 70cc in bladder. E.Coli grew out last time she was in but  only 20k CFU.  CT head neg, CXR unchanged.   Review of Systems: Unable to perform    COVID Vaccine Status:  Unknown  Past Medical History:  Diagnosis Date  . Adenomatous colon polyp   . Allergy   . Anemia   . Asthma       . CAD (coronary artery disease)    Mild very minimal coronary disease with 20% obtuse marginal stenosis  . Carpal tunnel syndrome on left   . Cataract   . CHF (congestive heart failure) ([place])   . Chronic kidney disease (CKD), stage III (moderate) (HCC)    now stage 4  . COPD (chronic obstructive pulmonary disease) ([place])   . Dementia with behavioral disturbance ([place]) 03/09/2020  . Depression   . 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 Diverticulosis   . Dyspnea   . 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 Esophageal dysmotility   . Esophageal stricture   . ESRD on hemodialysis ([place]) 03/09/2020  . Gastritis   . Gastroparesis 08/21/2007  . GERD (gastroesophageal reflux disease)   . Hearing loss of both ears   . Hernia, hiatal   . Hyperlipidemia   . Hypertension   . Morbid obesity ([place])   . OSTEOARTHRITIS 08/09/2006  . Osteoporosis   . Oxygen deficiency    prn   . 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 RENAL INSUFFICIENCY 02/16/2009  . 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 Seizures ([place])  pt thinks it has been several monthes since she had a seisure  . Shingles   . Sickle cell trait ([place])   . Sleep apnea    uses cpap  . Stroke [facility])     Past Surgical History:  Procedure Laterality Date  . ABDOMINAL HYSTERECTOMY    . ARTERY BIOPSY  01/07/2011   Procedure: MINOR BIOPSY TEMPORAL ARTERY;  Surgeon: Haywood Lasso, MD;  Location: [place];  Service: General;  Laterality: Left;  left temporal artery biopsy  . AV FISTULA PLACEMENT Right 11/16/2018   Procedure: CREATION RIGHT BRACHIOCEPHALIC FISTULA  ARTERIOVENOUS FISTULA;  Surgeon: Angelia Mould, MD;  Location: [place];  Service: Vascular;  Laterality:  Right;  . bil foot surgery    . BREAST LUMPECTOMY     benign  . BREAST LUMPECTOMY     both breast lumps removed   . CATARACT EXTRACTION Left 06/2016   Dr. Read Drivers  . COLONOSCOPY    . COLONOSCOPY WITH PROPOFOL N/A 07/05/2015   Procedure: COLONOSCOPY WITH PROPOFOL;  Surgeon: Manus Gunning, MD;  Location: [facility];  Service: Gastroenterology;  Laterality: N/A;  . COLONOSCOPY WITH PROPOFOL N/A 08/04/2018   Procedure: COLONOSCOPY WITH PROPOFOL;  Surgeon: Yetta Flock, MD;  Location: [facility];  Service: Gastroenterology;  Laterality: N/A;  . ERD  08/08/2000  . ESOPHAGEAL MANOMETRY N/A 03/13/2015   Procedure: ESOPHAGEAL MANOMETRY (EM);  Surgeon: Manus Gunning, MD;  [facility]: [facility];  Service: Gastroenterology;  Laterality: N/A;  . ESOPHAGOGASTRODUODENOSCOPY  06/25/2006  . ESOPHAGOGASTRODUODENOSCOPY (EGD) WITH PROPOFOL N/A 07/05/2015   Procedure: ESOPHAGOGASTRODUODENOSCOPY (EGD) WITH PROPOFOL;  Surgeon: Manus Gunning, MD;  [facility];  Service: Gastroenterology;  Laterality: N/A;  . FISTULA SUPERFICIALIZATION Right 11/16/2018   Procedure: Fistula Superficialization;  Surgeon: Angelia Mould, MD;  Location: [place];  Service: Vascular;  Laterality: Right;  . HERNIA REPAIR    . IR PERC TUN PERIT CATH WO PORT S&I Dartha Lodge  02/17/2020  . IR US GUIDE VASC ACCESS RIGHT  02/18/2020  . LEFT AND RIGHT HEART CATHETERIZATION WITH CORONARY ANGIOGRAM N/A 03/03/2013   Procedure: LEFT AND RIGHT HEART CATHETERIZATION WITH CORONARY ANGIOGRAM;  Surgeon: Minus Breeding, MD;  Location[facility] C[facility] LAB;  Service: Cardiovascular;  Laterality: N/A;  . REPLACEMENT TOTAL KNEE Left 1998  . UPPER GASTROINTESTINAL ENDOSCOPY      Social History   Socioeconomic History  . Marital status: Widowed    Spouse name: Not on file  . Number of children: 2  . Years of education: Not on file  . Highest education level: Not on file  Occupational History  . Occupation:  Retired    Fish farm manager: UNEMPLOYED  Tobacco Use  . Smoking status: Former Smoker    Packs/day: 0.50    Years: 10.00    Pack years: 5.00    Quit date: 04/18/1980    Years since quitting: 39.9  . Smokeless tobacco: Never Used  Vaping Use  . Vaping Use: Never used  Substance and Sexual Activity  . Alcohol use: No    Alcohol/week: 0.0 standard drinks  . Drug use: No  . Sexual activity: Not Currently    Birth control/protection: Post-menopausal    Comment: Hysterectomy  Other Topics Concern  . Not on file  Social History Narrative   Previously worked as a Electrical engineer.   Daily Caffeine Use-Coffee and Tea   Lives with a friend who is her care giver, has home health nurse come out once a  week.  She has family in town- daughter, grand daughter.    Social Determinants of Health   Financial Resource Strain: Not on file  Food Insecurity: Not on file  Transportation Needs: Not on file  Physical Activity: Not on file  Stress: Not on file  Social Connections: Not on file  Intimate Partner Violence: Not on file    Allergies  Allergen Reactions  . Morphine And Related Other (See Comments)    Family request not to be given, reports pt does not wake up when given   . Promethazine Hcl Other (See Comments)    REACTION: lethargy    Family History  Problem Relation Age of Onset  . Liver cancer Mother        Liver Cancer  . Diabetes Mother   . Kidney disease Mother   . Heart disease Mother        age 69's  . Heart disease Father 40       MI  . Heart attack Father        died of MI when pt was 13  . Sickle cell anemia Father   . Colon cancer Brother   . Cancer Brother        Colon Cancer  . Diabetes Sister   . Kidney disease Sister   . Heart disease Sister        age 7's  . Allergies Sister   . Diabetes Sister   . Kidney disease Sister   . Heart disease Sister        age 85's  . Esophageal cancer Neg Hx   . Rectal cancer Neg Hx   . Stomach cancer Neg Hx   . Amblyopia Neg  Hx   . Blindness Neg Hx   . Glaucoma Neg Hx   . Macular degeneration Neg Hx   . Retinal detachment Neg Hx   . Cataracts Neg Hx   . Strabismus Neg Hx   . Retinitis pigmentosa Neg Hx     Prior to Admission medications   Medication Sig Start Date End Date Taking? Authorizing Provider  acetaminophen (TYLENOL) 325 MG tablet Take 650 mg by mouth every 4 (four) hours as needed for moderate pain.   Yes [provider]  albuterol (VENTOLIN HFA) 108 (90 Base) MCG/ACT inhaler Inhale 2 puffs into the lungs every 4 (four) hours as needed for wheezing or shortness of breath. 03/02/20  Yes Sheikh, Omair Latif, DO  amLODipine (NORVASC) 5 MG tablet Take 1 tablet (5 mg total) by mouth every evening. 03/02/20  Yes Sheikh, Omair Latif, DO  ascorbic acid (VITAMIN C) 500 MG tablet Take 1 tablet (500 mg total) by mouth daily. 03/03/20  Yes Sheikh, Omair Latif, DO  aspirin EC 81 MG tablet Take 81 mg by mouth daily.   Yes [provider]  azelastine (ASTELIN) 0.1 % nasal spray Place 1 spray into both nostrils 2 (two) times daily as needed (allergies). Use in each nostril as directed 11/10/19  Yes [name], MD  calcium carbonate (OSCAL) 1500 (600 Ca) MG TABS tablet Take 1 tablet by mouth daily.   Yes [provider]  carvedilol (COREG) 25 MG tablet Take 25 mg by mouth 2 (two) times daily. 01/09/19  Yes [provider]  cholecalciferol (VITAMIN D) 25 MCG tablet Take 1 tablet (1,000 Units total) by mouth daily. 03/03/20  Yes Sheikh, Omair Latif, DO  donepezil (ARICEPT) 5 MG tablet Take 1 tablet (5 mg total) by mouth at bedtime. 03/02/20  Yes Sheikh, Omair Latif, DO  ezetimibe (ZETIA) 10 MG tablet Take 10 mg by mouth daily.   Yes [provider]  ferrous sulfate 325 (65 FE) MG tablet Take 325 mg by mouth daily.   Yes [provider]  Fluticasone-Umeclidin-Vilant (TRELEGY ELLIPTA) 100-62.5-25 MCG/INH AEPB Take 1 puff by mouth daily. 04/13/19  Yes Lauraine Rinne, NP   gabapentin (NEURONTIN) 100 MG capsule Take 1 capsule (100 mg total) by mouth 2 (two) times daily. Patient taking differently: Take 100 mg by mouth at bedtime. 09/12/17  Yes Hongalgi, Lenis Dickinson, MD  guaiFENesin-dextromethorphan (ROBITUSSIN DM) 100-10 MG/5ML syrup Take 10 mLs by mouth every 4 (four) hours as needed for cough. 03/02/20  Yes Sheikh, Omair Latif, DO  hydrALAZINE (APRESOLINE) 100 MG tablet Take 1 tablet by mouth 3 (three) times daily. 05/10/19  Yes [provider]  Ipratropium-Albuterol (COMBIVENT) 20-100 MCG/ACT AERS respimat Inhale 1 puff into the lungs every 6 (six) hours as needed for wheezing or shortness of breath. 03/02/20  Yes Sheikh, Omair Latif, DO  lip balm (CARMEX) ointment Apply topically as needed for lip care. 03/02/20  Yes Sheikh, Omair Latif, DO  montelukast (SINGULAIR) 10 MG tablet Take 1 tablet (10 mg total) by mouth daily. Patient taking differently: Take 10 mg by mouth at bedtime. 10/22/17  Yes Lauraine Rinne, NP  nitroGLYCERIN (NITROSTAT) 0.4 MG SL tablet PLACE ONE TABLET UNDER THE TONGUE EVERY FIVE MINUTES AS NEEDED FOR CHEST PAIN Patient taking differently: Place 0.4 mg under the tongue every 5 (five) minutes as needed for chest pain. 07/08/16  Yes Minus Breeding, MD  omeprazole (PRILOSEC) 40 MG capsule Take 1 capsule (40 mg total) by mouth in the morning and at bedtime. Please schedule a yearly follow up: [phone]. Thank you 09/01/19  Yes Armbruster, Carlota Raspberry, MD  OXYGEN Inhale 2 L into the lungs as needed. CPAP with oxygen at bedtime   Yes [provider]  polyethylene glycol (MIRALAX) 17 g packet Take 17 g by mouth daily as needed. Patient taking differently: Take 17 g by mouth daily as needed (constipation.). 06/18/18  Yes Armbruster, Carlota Raspberry, MD  Propylene Glycol (SYSTANE BALANCE) 0.6 % SOLN Place 1 drop into both eyes daily as needed (dry eyes).   Yes [provider]  TRUEPLUS PEN NEEDLES 32G X 4 MM MISC INJECT THREE TIMES A DAY AS DIRECTED  07/08/17  Yes [provider]  zinc sulfate 220 (50 Zn) MG capsule Take 1 capsule (220 mg total) by mouth daily. 03/03/20  Yes Raiford Noble Cruger, [place]    Physical Exam: Vitals:   03/09/20 0900 03/09/20 0933 03/09/20 1100 03/09/20 1113  BP: (!) 102/59  (!) 126/58 (!) 126/58  Pulse: 60  65 67  Resp: 16  18 18   Temp:   97.9 F (36.6 C) (!) 97.3 F (36.3 C)  TempSrc:   Oral Oral  SpO2: 100% 100% 96% 96%     . General: Very somnolent, opens eyes to loud voice/touch but minimally interactive . Eyes:   normal lids, iris, does not fix gaze well . ENT:  grossly normal lips & tongue, mmm; suboptimal/absent dentition . Neck:  no LAD, masses or thyromegaly . Cardiovascular:  RRR, no m/r/g. No LE edema.  R TDC in place . Respiratory:   CTA bilaterally with no wheezes/rales/rhonchi.  Normal respiratory effort. . Abdomen:  soft, NT, ND, NABS . Skin:  no rash or induration seen on limited exam . Musculoskeletal:  grossly normal tone BUE/BLE, good  ROM, no bony abnormality . Lower extremity:  No LE edema.  Limited foot exam with no ulcerations.  2+ distal pulses. Marland Kitchen Psychiatric:  Somnolent to almost obtunded, answers limited questions and lapses back to sleep . Neurologic: unable to perform    Radiological Exams on Admission: Independently reviewed - see discussion in A/P where applicable  CT Head Wo Contrast  Result Date: 03/09/2020 CLINICAL DATA:  Mental status change, unknown cause EXAM: CT HEAD WITHOUT CONTRAST TECHNIQUE: Contiguous axial images were obtained from the base of the skull through the vertex without intravenous contrast. COMPARISON:  Head CT 02/16/2020 FINDINGS: Brain: Stable degree of atrophy and chronic small vessel ischemia. No intracranial hemorrhage, mass effect, or midline shift. No hydrocephalus. The basilar cisterns are patent. No evidence of territorial infarct or acute ischemia. No extra-axial or intracranial fluid collection. Vascular: Atherosclerosis of  skullbase vasculature without hyperdense vessel or abnormal calcification. Skull: No fracture or focal lesion. Sinuses/Orbits: Similar fluid level in left side of sphenoid sinus. Mild mucosal thickening of maxillary sinuses. Mastoid air cells are clear. No acute orbital abnormality. Other: None. IMPRESSION: 1. No acute intracranial abnormality. 2. Stable atrophy and chronic small vessel ischemia. Electronically Signed   By: Keith Rake M.D.   On: 03/09/2020 04:00   DG Chest Port 1 View  Result Date: 03/09/2020 CLINICAL DATA:  Altered mental status EXAM: PORTABLE CHEST 1 VIEW COMPARISON:  One week ago FINDINGS: Cardiomegaly and vascular pedicle widening. Subpleural predominant airspace opacity greater to the right where there are lower lung volumes. Dialysis catheter with tip at the right atrium. Artifact from EKG leads IMPRESSION: Patchy bilateral pneumonia similar to 1 week prior. Electronically Signed   By: Monte Fantasia M.D.   On: 03/09/2020 04:02    EKG: Independently reviewed.  NSR with rate 66; no evidence of acute ischemia   Labs on Admission: I have personally reviewed the available labs and imaging studies at the time of the admission.  Pertinent labs:   Glucose 115 BUN 34/Creatinine 8.21/GFR 5 Albumin 2.3 WBC 7.6 Hgb 7.5 Platelets 95 UA: trace LE, 100 protein, few bacteria COVID POSITIVE on 1/25 - this is > 21 days   Assessment/Plan Principal Problem:   Acute metabolic encephalopathy Active Problems:   HYPERCHOLESTEROLEMIA   Obstructive sleep apnea   Essential hypertension   Diabetes mellitus type 2 in obese ([place])   COVID-19   ESRD on hemodialysis (HCC)   Thrombocytopenia (HCC)   Dementia with behavioral disturbance (HCC)     AMS - acute metabolic encephalopathy -Patient presenting with encephalopathy as evidenced by her obtunded/minimal responsiveness -While the patient does have underlying dementia, this is a change compared to her usual baseline mental  status -Evaluation thus far unremarkable but she is behind on HD so uremia is a consideration despite relatively low BUN -There is no evidence of infection at this time -Negative head CT -Based on unremarkable evaluation with current ability to protect her airway, will observe for now with plan for HD and telemetry monitoring  Recent COVID-19 infection with PNA -Post-COVID encephalopathy is a consideration, but she is fairly remote from her infection (>21 days) and not clearly requiring additional O2 compared to baseline  ESRD on HD -Patient had difficulty with [facility] and so missed HD -Uremia is the current working diagnosis for her encephalopathy, but if it does not clear post-HD she may need further evaluation -Nephrology prn order set utilized -Nephrology is aware that patient will need HD  DM -Recent A1c was 7.4, indicating suboptimal  control -Hold Neurontin due to AMS  HTN -Hold BP medications including Norvasc, Coreg, hydralazine  HLD -Continue Zetia  Thrombocytopenia -Previously thought to be due to chronic renal disease, COVID-19. -Continues to downtrend along with remainder of cell counts -Will follow for now -Will hold ASA -Will also hold heparin for DVT prophylaxis and use SCDs for now  COPD with Chronic Hypoxic Respiratory Failure -Stable, no wheezing -Continue 2L O2 via [place] -Continue Trelegy, Combivent, Singulair -Also on nighttime CPAP, will order  Anemia due to chronic kidney disease -Slowly downtrending during and since last hospitalization -Continue monitor for signs and symptoms of bleeding; currently no overt bleeding noted -Consider 1 unit transfusion during HD -Repeat CBC in a.m.  Dementia -Continue withDonepezil  Obesity -Complicates overall prognosis and care -Estimated body mass index is 38.7     Note: This patient has been released from quarantine from her recent COVID-19 infection.   Level of care: Telemetry Medical DVT  prophylaxis: SCDs Code Status:  Full - confirmed with patient/family Family Communication: Boyfriend was present during evaluation; I also spoke with her daughter, who is a Marine scientist at [employer] and is her Education officer, community, by telephone Disposition Plan:  The patient is from: SNF rehab  Anticipated d/c is to: SNF rehab  Anticipated d/c date will depend on clinical response to treatment, but possibly as early as tomorrow if she has excellent response to treatment  Patient is currently: acutely ill Consults called: Nephrology Admission status:  It is my clinical opinion that referral for OBSERVATION is reasonable and necessary in this patient based on the above information provided. The aforementioned taken together are felt to place the patient at high risk for further clinical deterioration. However it is anticipated that the patient may be medically stable for discharge from the hospital within 24 to 48 hours.    Karmen Bongo MD [facility]   How to contact the [facility] Attending or Consulting provider [place] or covering provider during after hours [place], for this patient?  1. Check the care team in [facility] and look for a) attending/consulting TR[facility] provider listed and b) the [facility] team listed 2. Log into [email or web address] and use [place]'s universal password to access. If you do not have the password, please contact the hospital operator. 3. Locate the [facility] provider you are looking for under [facility] and page to a number that you can be directly reached. 4. If you still have difficulty reaching the provider, please page the [facility] (Director on Call) for the Hospitalists listed on amion for assistance.   03/09/2020, 11:28 AM

## 2020-03-09 NOTE — Progress Notes (Signed)
Patient returned from Hemodialysis via stretcher.

## 2020-03-09 NOTE — ED Notes (Signed)
Called and spoke with daughter [name] [phone] update given.

## 2020-03-09 NOTE — ED Notes (Signed)
Bladder Scanned patient 67 cc in bladder, Dr. Toney Sang wants IO cath.

## 2020-03-09 NOTE — Progress Notes (Signed)
[facility] Progress Note   Subjective:   Patient seen and examined at bedside in ED with husband present.  History obtained from husband and chart review due to patient AMS.  Recent [facility] admission due to CKD progression to ESRD requiring dialysis.  Discharge due to SNF on 03/02/20.  Per husband her sister went to visit her yesterday and found her to be altered.  Reported very sleepy, not interacting with family or PT.  Sent over to [facility] due to [place]. Husband said she was last at baseline line "some time in January." He was told by the SNF that she did receive dialysis on Tuesday due to an issue with her catheter.  On arrival she was alert and confused.  Today she is somnolent, awakes to painful stimuli.  Reports feeling "bad."  Denies CP, SOB and n/v/d.   Review of outpatient records/discussion with charge nurse at outpatient center revealed patient was unable to complete dialysis on Saturday due to a malfunctioning TDC.  She was scheduled for [facility] exchange on Tuesday morning but for unknown reasons did not go.  At dialysis Tuesday afternoon provider assess RU AVF and ok its use.  Patient was able to complete full treatment using AVF and left a little under her dry weight.     Objective Vitals:   03/09/20 0600 03/09/20 0656 03/09/20 0730 03/09/20 0800  BP: (!) 115/52 (!) 114/57 (!) 102/56 (!) 122/50  Pulse: 65 63 60 66  Resp: 19 20 15  (!) 22  Temp:  97.8 F (36.6 C)    TempSrc:  Oral    SpO2: 99% 100% 100% 99%   Physical Exam General:chronically ill appearing somnolent female in NAD Heart:RRR, no mrg Lungs:CTAB anterolaterally, nml WOB on 2L via [place]  Abdomen:soft, obese, NTND Extremities:trace pitting LE edema Dialysis Access: RU AVF   There were no vitals filed for this visit. No intake or output data in the 24 hours ending 03/09/20 0901  Additional Objective Labs: Basic Metabolic Panel: Recent Labs  Lab 03/08/20 1441  NA 140  K 4.7  CL 102  CO2 26  GLUCOSE 115*   BUN 34*  CREATININE 8.21*  CALCIUM 7.9*   Liver Function Tests: Recent Labs  Lab 03/08/20 1441  AST 11*  ALT 9  ALKPHOS 56  BILITOT 1.0  PROT 6.2*  ALBUMIN 2.3*   CBC: Recent Labs  Lab 03/08/20 1441  WBC 7.6  NEUTROABS 3.7  HGB 7.5*  HCT 26.6*  MCV 79.2*  PLT 95*   CBG: Recent Labs  Lab 03/02/20 1136 03/02/20 1758 03/09/20 0325  GLUCAP 192* 134* 214*   Studies/Results: CT Head Wo Contrast  Result Date: 03/09/2020 CLINICAL DATA:  Mental status change, unknown cause EXAM: CT HEAD WITHOUT CONTRAST TECHNIQUE: Contiguous axial images were obtained from the base of the skull through the vertex without intravenous contrast. COMPARISON:  Head CT 02/16/2020 FINDINGS: Brain: Stable degree of atrophy and chronic small vessel ischemia. No intracranial hemorrhage, mass effect, or midline shift. No hydrocephalus. The basilar cisterns are patent. No evidence of territorial infarct or acute ischemia. No extra-axial or intracranial fluid collection. Vascular: Atherosclerosis of skullbase vasculature without hyperdense vessel or abnormal calcification. Skull: No fracture or focal lesion. Sinuses/Orbits: Similar fluid level in left side of sphenoid sinus. Mild mucosal thickening of maxillary sinuses. Mastoid air cells are clear. No acute orbital abnormality. Other: None. IMPRESSION: 1. No acute intracranial abnormality. 2. Stable atrophy and chronic small vessel ischemia. Electronically Signed   By: Keith Rake  M.D.   On: 03/09/2020 04:00   DG Chest Port 1 View  Result Date: 03/09/2020 CLINICAL DATA:  Altered mental status EXAM: PORTABLE CHEST 1 VIEW COMPARISON:  One week ago FINDINGS: Cardiomegaly and vascular pedicle widening. Subpleural predominant airspace opacity greater to the right where there are lower lung volumes. Dialysis catheter with tip at the right atrium. Artifact from EKG leads IMPRESSION: Patchy bilateral pneumonia similar to 1 week prior. Electronically Signed   By:  Monte Fantasia M.D.   On: 03/09/2020 04:02   Dialysis Orders: [facility] - TTS 3.5hr 400/800 112kg 3K 2.25Ca TDC No Heparin mircera 189mcg q2wks - last 2/15 No VDRA  Assessment/Plan: 1. AMS - etiology unclear.  WBC 7.6. CT head neg. CXR with PNA similar to last imaging. Work up ongoing. Has underlying dementia.  2. ESRD - on HD MWF.  HD completed on Wednesday using AVF due to issues with catheter.  Will write orders for HD today using AVF.  Activase added to [facility] in ED, will flush and see if working.  May need to have exchanged if not working but if able to continue to use AVF may be able to have removed.  3. Anemia of CKD- Hgb 7.5.  ESA given on 2/15.  4. Secondary hyperparathyroidism - Corrected Ca at goal.  Use increased Ca bath.  Not on VDRA.  Will check phos.  5. HTN/volume - Blood pressure well controlled. Does not appear grossly volume overloaded.  Will UF as tolerated.  6. Nutrition - Renal diet with fluid restrictions.  7. DMT2 - per primary 8. COPD w/chronic hypoxic Resp Failure 9. [facility] 03/09/2020,9:01 AM  LOS: 0 days

## 2020-03-09 NOTE — Plan of Care (Signed)
Not progressing at this time due to confusion and altered mental status.   Problem: Education: Goal: Knowledge of General Education information will improve Description: Including pain rating scale, medication(s)/side effects and non-pharmacologic comfort measures Outcome: Not Progressing   Problem: Health Behavior/Discharge Planning: Goal: Ability to manage health-related needs will improve Outcome: Not Progressing   Problem: Clinical Measurements: Goal: Ability to maintain clinical measurements within normal limits will improve Outcome: Not Progressing Goal: Will remain free from infection Outcome: Not Progressing Goal: Diagnostic test results will improve Outcome: Not Progressing Goal: Respiratory complications will improve Outcome: Not Progressing Goal: Cardiovascular complication will be avoided Outcome: Not Progressing   Problem: Activity: Goal: Risk for activity intolerance will decrease Outcome: Not Progressing   Problem: Nutrition: Goal: Adequate nutrition will be maintained Outcome: Not Progressing   Problem: Coping: Goal: Level of anxiety will decrease Outcome: Not Progressing   Problem: Elimination: Goal: Will not experience complications related to bowel motility Outcome: Not Progressing Goal: Will not experience complications related to urinary retention Outcome: Not Progressing   Problem: Pain Managment: Goal: General experience of comfort will improve Outcome: Not Progressing   Problem: Safety: Goal: Ability to remain free from injury will improve Outcome: Not Progressing   Problem: Skin Integrity: Goal: Risk for impaired skin integrity will decrease Outcome: Not Progressing

## 2020-03-10 ENCOUNTER — Telehealth: Payer: Self-pay | Admitting: Pulmonary Disease

## 2020-03-10 DIAGNOSIS — R4182 Altered mental status, unspecified: Secondary | ICD-10-CM | POA: Diagnosis present

## 2020-03-10 DIAGNOSIS — G9340 Encephalopathy, unspecified: Secondary | ICD-10-CM | POA: Diagnosis not present

## 2020-03-10 DIAGNOSIS — Z992 Dependence on renal dialysis: Secondary | ICD-10-CM

## 2020-03-10 DIAGNOSIS — G4733 Obstructive sleep apnea (adult) (pediatric): Secondary | ICD-10-CM

## 2020-03-10 DIAGNOSIS — Z9989 Dependence on other enabling machines and devices: Secondary | ICD-10-CM

## 2020-03-10 DIAGNOSIS — J9611 Chronic respiratory failure with hypoxia: Secondary | ICD-10-CM | POA: Diagnosis present

## 2020-03-10 DIAGNOSIS — G9341 Metabolic encephalopathy: Principal | ICD-10-CM

## 2020-03-10 DIAGNOSIS — F0391 Unspecified dementia with behavioral disturbance: Secondary | ICD-10-CM

## 2020-03-10 DIAGNOSIS — K3184 Gastroparesis: Secondary | ICD-10-CM | POA: Diagnosis present

## 2020-03-10 DIAGNOSIS — N186 End stage renal disease: Secondary | ICD-10-CM

## 2020-03-10 DIAGNOSIS — M810 Age-related osteoporosis without current pathological fracture: Secondary | ICD-10-CM | POA: Diagnosis present

## 2020-03-10 DIAGNOSIS — I272 Pulmonary hypertension, unspecified: Secondary | ICD-10-CM | POA: Diagnosis present

## 2020-03-10 DIAGNOSIS — E1169 Type 2 diabetes mellitus with other specified complication: Secondary | ICD-10-CM

## 2020-03-10 DIAGNOSIS — E669 Obesity, unspecified: Secondary | ICD-10-CM | POA: Diagnosis present

## 2020-03-10 DIAGNOSIS — I5032 Chronic diastolic (congestive) heart failure: Secondary | ICD-10-CM | POA: Diagnosis present

## 2020-03-10 DIAGNOSIS — E1142 Type 2 diabetes mellitus with diabetic polyneuropathy: Secondary | ICD-10-CM | POA: Diagnosis present

## 2020-03-10 DIAGNOSIS — I132 Hypertensive heart and chronic kidney disease with heart failure and with stage 5 chronic kidney disease, or end stage renal disease: Secondary | ICD-10-CM | POA: Diagnosis present

## 2020-03-10 DIAGNOSIS — D631 Anemia in chronic kidney disease: Secondary | ICD-10-CM | POA: Diagnosis present

## 2020-03-10 DIAGNOSIS — D573 Sickle-cell trait: Secondary | ICD-10-CM | POA: Diagnosis present

## 2020-03-10 DIAGNOSIS — H918X3 Other specified hearing loss, bilateral: Secondary | ICD-10-CM | POA: Diagnosis present

## 2020-03-10 DIAGNOSIS — K219 Gastro-esophageal reflux disease without esophagitis: Secondary | ICD-10-CM | POA: Diagnosis present

## 2020-03-10 DIAGNOSIS — E1122 Type 2 diabetes mellitus with diabetic chronic kidney disease: Secondary | ICD-10-CM | POA: Diagnosis present

## 2020-03-10 DIAGNOSIS — E1143 Type 2 diabetes mellitus with diabetic autonomic (poly)neuropathy: Secondary | ICD-10-CM | POA: Diagnosis present

## 2020-03-10 DIAGNOSIS — I2510 Atherosclerotic heart disease of native coronary artery without angina pectoris: Secondary | ICD-10-CM | POA: Diagnosis present

## 2020-03-10 DIAGNOSIS — J449 Chronic obstructive pulmonary disease, unspecified: Secondary | ICD-10-CM | POA: Diagnosis present

## 2020-03-10 DIAGNOSIS — N2581 Secondary hyperparathyroidism of renal origin: Secondary | ICD-10-CM | POA: Diagnosis present

## 2020-03-10 DIAGNOSIS — D696 Thrombocytopenia, unspecified: Secondary | ICD-10-CM

## 2020-03-10 DIAGNOSIS — E785 Hyperlipidemia, unspecified: Secondary | ICD-10-CM | POA: Diagnosis present

## 2020-03-10 LAB — GLUCOSE, CAPILLARY
Glucose-Capillary: 127 mg/dL — ABNORMAL HIGH (ref 70–99)
Glucose-Capillary: 120 mg/dL — ABNORMAL HIGH (ref 70–99)
Glucose-Capillary: 139 mg/dL — ABNORMAL HIGH (ref 70–99)
Glucose-Capillary: 191 mg/dL — ABNORMAL HIGH (ref 70–99)

## 2020-03-10 LAB — CBC
HCT: 24.6 % — ABNORMAL LOW (ref 36.0–46.0)
Hemoglobin: 7.1 g/dL — ABNORMAL LOW (ref 12.0–15.0)
MCH: 22.5 pg — ABNORMAL LOW (ref 26.0–34.0)
MCHC: 28.9 g/dL — ABNORMAL LOW (ref 30.0–36.0)
MCV: 77.8 fL — ABNORMAL LOW (ref 80.0–100.0)
Platelets: 94 10*3/uL — ABNORMAL LOW (ref 150–400)
RBC: 3.16 MIL/uL — ABNORMAL LOW (ref 3.87–5.11)
RDW: 24 % — ABNORMAL HIGH (ref 11.5–15.5)
WBC: 5.8 10*3/uL (ref 4.0–10.5)
nRBC: 0.5 % — ABNORMAL HIGH (ref 0.0–0.2)

## 2020-03-10 LAB — BASIC METABOLIC PANEL
Anion gap: 9 (ref 5–15)
BUN: 26 mg/dL — ABNORMAL HIGH (ref 8–23)
CO2: 27 mmol/L (ref 22–32)
Calcium: 7.8 mg/dL — ABNORMAL LOW (ref 8.9–10.3)
Chloride: 98 mmol/L (ref 98–111)
Creatinine, Ser: 6.75 mg/dL — ABNORMAL HIGH (ref 0.44–1.00)
GFR, Estimated: 6 mL/min — ABNORMAL LOW (ref >60)
Glucose, Bld: 157 mg/dL — ABNORMAL HIGH (ref 70–99)
Potassium: 4.1 mmol/L (ref 3.5–5.1)
Sodium: 134 mmol/L — ABNORMAL LOW (ref 135–145)

## 2020-03-10 NOTE — Telephone Encounter (Signed)
She uses CPAP 11 cm H2O with 2 liters oxygen at night.  Okay to send order for her to use this set up at [facility].

## 2020-03-10 NOTE — Evaluation (Signed)
Physical Therapy Evaluation Patient Details Name: Joann Gutierrez MRN: [id number] DOB: August 04, 1945 Today's Date: 03/10/2020   History of Present Illness  Pt is a 75 y/o female admitted from SNF secondary to acute metabolic encephalopathy. PMH includes CHF, HTN, DM, recent COVID, dementia, and ESRD on HD.  Clinical Impression  Pt admitted secondary to problem above with deficits below. Pt requiring total A for bed mobility tasks this session. Pt with brief period of alertness, however, quickly fell back asleep in sitting. Unsafe to attempt further mobility. Per notes, pt from SNF, and recommend return at d/c. Will continue to follow acutely.     Follow Up Recommendations SNF;Supervision/Assistance - 24 hour    Equipment Recommendations  Wheelchair cushion (measurements PT);Wheelchair (measurements PT)    Recommendations for Other Services       Precautions / Restrictions Precautions Precautions: Fall Restrictions Weight Bearing Restrictions: No      Mobility  Bed Mobility Overal bed mobility: Needs Assistance Bed Mobility: Supine to Sit;Sit to Supine     Supine to sit: Total assist Sit to supine: Total assist   General bed mobility comments: Total A for LE and trunk assist to come to sitting. Pt with brief period of alertness, however, quickly fell back to sleep.    Transfers                 General transfer comment: unsafe to attempt  Ambulation/Gait                Stairs            Wheelchair Mobility    Modified Rankin (Stroke Patients Only)       Balance Overall balance assessment: Needs assistance Sitting-balance support: No upper extremity supported;Feet supported Sitting balance-[name]: Zero Sitting balance - Comments: total A to maintain sitting balance, as pt was falling asleep in sitting.                                     Pertinent Vitals/Pain Pain Assessment: Faces Faces Pain Scale: No hurt    Home  Living Family/patient expects to be discharged to:: Skilled nursing facility                 Additional Comments: Per chart, pt from SNF    Prior Function Level of Independence: Needs assistance   Gait / Transfers Assistance Needed: Pt unable to report, but assume she was assisted by staff for mobility tasks.  ADL's / Homemaking Assistance Needed: Assume she was assisted by staff for all ADLs.        Hand Dominance        Extremity/Trunk Assessment   Upper Extremity Assessment Upper Extremity Assessment: Generalized weakness    Lower Extremity Assessment Lower Extremity Assessment: Generalized weakness    Cervical / Trunk Assessment Cervical / Trunk Assessment: Kyphotic  Communication   Communication: HOH;Expressive difficulties  Cognition Arousal/Alertness: Lethargic Behavior During Therapy: Flat affect Overall Cognitive Status: Difficult to assess                                 General Comments: Pt very sleepy throughout. Attempted to wipe pt's face with a wet cloth and pt would open eyes and jerk away, but then would fall quickly back to sleep. Pt also falling asleep when sitting at EOB and unable to keep eyes  open.      General Comments General comments (skin integrity, edema, etc.): No family present    Exercises     Assessment/Plan    PT Assessment Patient needs continued PT services  PT Problem List Decreased strength;Decreased balance;Decreased mobility;Decreased activity tolerance;Decreased cognition;Decreased knowledge of use of DME;Decreased safety awareness;Decreased knowledge of precautions       PT Treatment Interventions Gait training;DME instruction;Functional mobility training;Therapeutic activities;Therapeutic exercise;Balance training;Stair training;Patient/family education;Cognitive remediation    PT Goals (Current goals can be found in the Care Plan section)  Acute Rehab PT Goals PT Goal Formulation: Patient unable to  participate in goal setting Time For Goal Achievement: 03/24/20 Potential to Achieve Goals: Fair    Frequency Min 2X/week   Barriers to discharge        Co-evaluation               AM-PAC PT "6 Clicks" Mobility  Outcome Measure Help needed turning from your back to your side while in a flat bed without using bedrails?: Total Help needed moving from lying on your back to sitting on the side of a flat bed without using bedrails?: Total Help needed moving to and from a bed to a chair (including a wheelchair)?: Total Help needed standing up from a chair using your arms (e.g., wheelchair or bedside chair)?: Total Help needed to walk in hospital room?: Total Help needed climbing 3-5 steps with a railing? : Total 6 Click Score: 6    End of Session   Activity Tolerance: Patient limited by lethargy Patient left: in bed;with call bell/phone within reach;with bed alarm set Nurse Communication: Mobility status PT Visit Diagnosis: Unsteadiness on feet (R26.81);Muscle weakness (generalized) (M62.81);Other abnormalities of gait and mobility (R26.89)    Time: 0626-9485 PT Time Calculation (min) (ACUTE ONLY): 12 min   Charges:   PT Evaluation $PT Eval Moderate Complexity: 1 Mod          Joann Gutierrez, PT, DPT  Acute Rehabilitation Services  Pager: [phone] Office: [phone]   Joann Gutierrez 03/10/2020, 1:39 PM

## 2020-03-10 NOTE — NC FL2 (Signed)
[place] MEDICAID FL2 LEVEL OF CARE SCREENING TOOL     IDENTIFICATION  Patient Name: Joann Gutierrez Birthdate: [birth year] Sex: female Admission Date (Current Location): 03/08/2020  [facility] and [place] Number:  Herbalist and Address:  [facility] [place], [place] [address], [place], [place] [address]      Provider Number: [id number]  Attending Physician Name and Address:  Debbe Odea, MD  Relative Name and Phone Number:       Current Level of Care: Hospital Recommended Level of Care: [place] Prior Approval Number:    Date Approved/Denied:   PASRR Number: [id number]  Discharge Plan: SNF    Current Diagnoses: Patient Active Problem List   Diagnosis Date Noted  . Acute metabolic encephalopathy 29/52/8413  . ESRD on hemodialysis ([place]) 03/09/2020  . Thrombocytopenia ([place]) 03/09/2020  . Dementia with behavioral disturbance ([place]) 03/09/2020  . COVID-19   . SOB (shortness of breath)   . Type 2 diabetes mellitus with hyperlipidemia ([place])   . Sepsis ([place]) 02/16/2020  . Acute on chronic respiratory failure with hypoxia ([place]) 02/16/2020  . Edema of left lower leg 02/16/2020  . Pneumonia due to COVID-19 virus 02/15/2020  . Acute on chronic diastolic HF (heart failure) ([place]) 11/18/2019  . Dyslipidemia 11/18/2019  . Polypharmacy 04/13/2019  . Healthcare maintenance 10/15/2017  . ARF (acute renal failure) ([place]) 09/07/2017  . Chest pain 09/05/2017  . Ineffective health maintenance 06/26/2017  . Medication management 06/26/2017  . Retinal edema 10/07/2016  . Postherpetic neuralgia 12/21/2015  . Herpes zoster with complication 24/40/1027  . Cellulitis of face 11/27/2015  . Anemia of chronic disease 08/17/2015  . History of colonic polyps   . Hypertensive emergency 05/20/2015  . Hypokalemia 05/20/2015  . AKI (acute kidney injury) ([place])   . Esophageal dysmotility 03/01/2015  . Throat congestion 10/11/2014  . Morbid obesity due  to excess calories ([place]) 09/23/2014  . Acute asthmatic bronchitis 05/20/2014  . Abdominal pain, other specified site 09/13/2013  . Elevated liver enzymes 09/13/2013  . Cough 05/05/2013  . Pain in lower limb 03/05/2013  . DOE (dyspnea on exertion) 02/03/2013  . Chronic respiratory failure ([place]) 12/13/2012  . Stricture and stenosis of esophagus 08/31/2012  . Onychomycosis 06/01/2012  . Pain in joint, ankle and foot 06/01/2012  . Diabetes mellitus type 2 in obese ([place]) 04/29/2012  . Essential hypertension 07/19/2011  . CKD (chronic kidney disease), stage III ([place]) 05/19/2011  . Family history of malignant neoplasm of gastrointestinal tract 10/30/2010  . Bloating 10/16/2010  . Epigastric pain 10/16/2010  . Foot pain 09/25/2010  . Dysphagia 07/16/2010  . GERD (gastroesophageal reflux disease) 07/16/2010  . Microcytic anemia 07/16/2010  . Obstructive sleep apnea 06/16/2009  . HYPERCHOLESTEROLEMIA 02/16/2009  . 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 Gastroparesis 08/21/2007  . Chronic obstructive asthma ([place]) 08/09/2006    Orientation RESPIRATION BLADDER Height & Weight     Self  O2,Other (Comment) ([place] 2L, CPAP at night) Incontinent Weight: 242 lb 4.6 oz (109.9 kg) Height:     BEHAVIORAL SYMPTOMS/MOOD NEUROLOGICAL BOWEL NUTRITION STATUS      Incontinent Diet (see DC summary)  AMBULATORY STATUS COMMUNICATION OF NEEDS Skin   Extensive Assist Verbally Normal                       Personal Care Assistance Level of Assistance  Bathing,Feeding,Dressing Bathing Assistance: Maximum assistance Feeding assistance: Limited assistance Dressing Assistance: Maximum assistance     Functional  Limitations Info  Sight,Hearing,Speech Sight Info: Impaired Hearing Info: Impaired Speech Info: Adequate    SPECIAL CARE FACTORS FREQUENCY  PT (By licensed PT),OT (By licensed OT)     PT Frequency: 5x/wk OT Frequency: 5x/wk            Contractures Contractures Info: Not present     Additional Factors Info  Code Status,Allergies Code Status Info: Full Allergies Info: Morphine And Related, Promethazine Hcl           Current Medications (03/10/2020):  This is the current hospital active medication list Current Facility-Administered Medications  Medication Dose Route Frequency Provider Last Rate Last Admin  . acetaminophen (TYLENOL) tablet 650 mg  650 mg Oral Q6H PRN Karmen Bongo, MD       Or  . acetaminophen (TYLENOL) suppository 650 mg  650 mg Rectal Q6H PRN Karmen Bongo, MD      . bisacodyl (DULCOLAX) EC tablet 5 mg  5 mg Oral Daily PRN Karmen Bongo, MD      . calcium carbonate (dosed in mg elemental calcium) suspension 500 mg of elemental calcium  500 mg of elemental calcium Oral Q6H PRN Karmen Bongo, MD      . camphor-menthol [facility]) lotion 1 application  1 application Topical Z6X PRN Karmen Bongo, MD       And  . hydrOXYzine (ATARAX/VISTARIL) tablet 25 mg  25 mg Oral Q8H PRN Karmen Bongo, MD      . Chlorhexidine Gluconate Cloth 2 % PADS 6 each  6 each Topical Q0600 Karmen Bongo, MD   6 each at 03/10/20 [id number]  . docusate sodium (COLACE) capsule 100 mg  100 mg Oral BID Karmen Bongo, MD   100 mg at 03/10/20 0915  . docusate sodium (ENEMEEZ) enema 283 mg  1 enema Rectal PRN Karmen Bongo, MD      . donepezil (ARICEPT) tablet 5 mg  5 mg Oral QHS Karmen Bongo, MD   5 mg at 03/09/20 2057  . ezetimibe (ZETIA) tablet 10 mg  10 mg Oral Daily Karmen Bongo, MD   10 mg at 03/10/20 0915  . feeding supplement (NEPRO CARB STEADY) liquid 237 mL  237 mL Oral TID PRN Karmen Bongo, MD      . fluticasone furoate-vilanterol (BREO ELLIPTA) 100-25 MCG/INH 1 puff  1 puff Inhalation Daily Karmen Bongo, MD   1 puff at 03/10/20 0824   And  . umeclidinium bromide (INCRUSE ELLIPTA) 62.5 MCG/INH 1 puff  1 puff Inhalation Daily Karmen Bongo, MD   1 puff at 03/10/20 0825  . hydrALAZINE (APRESOLINE) injection 5 mg  5 mg Intravenous Q4H PRN Karmen Bongo, MD      . insulin aspart (novoLOG) injection 0-6 Units  0-6 Units Subcutaneous TID WC Karmen Bongo, MD   1 Units at 03/09/20 1826  . ipratropium-albuterol (DUONEB) 0.5-2.5 (3) MG/3ML nebulizer solution 3 mL  3 mL Nebulization Q6H PRN Karmen Bongo, MD      . montelukast (SINGULAIR) tablet 10 mg  10 mg Oral Ivery Quale, MD   10 mg at 03/09/20 2056  . ondansetron (ZOFRAN) tablet 4 mg  4 mg Oral Q6H PRN Karmen Bongo, MD       Or  . ondansetron [facility]) injection 4 mg  4 mg Intravenous Q6H PRN Karmen Bongo, MD      . pantoprazole (PROTONIX) EC tablet 40 mg  40 mg Oral BID Karmen Bongo, MD   40 mg at 03/10/20 0915  . polyethylene glycol (MIRALAX / GLYCOLAX)  packet 17 g  17 g Oral Daily PRN Karmen Bongo, MD      . polyvinyl alcohol (LIQUIFILM TEARS) 1.4 % ophthalmic solution 1 drop  1 drop Both Eyes Daily PRN Karmen Bongo, MD      . sodium chloride flush (NS) 0.9 % injection 3 mL  3 mL Intravenous Q12H Karmen Bongo, MD      . sorbitol 70 % solution 30 mL  30 mL Oral PRN Karmen Bongo, MD         Discharge Medications: Please see discharge summary for a list of discharge medications.  Relevant Imaging Results:  Relevant Lab Results:   Additional Information SS#: [id number]  Geralynn Ochs, LCSW

## 2020-03-10 NOTE — Progress Notes (Signed)
PROGRESS NOTE    Joann Gutierrez   BHA:[id number]  DOB: 1945/02/27  DOA: 03/08/2020 PCP: Andree Moro, DO   Brief Narrative:  Joann Gutierrez is a 75 year old woman with end-stage renal disease, COPD with chronic hypoxemic respiratory failure on 2 L of oxygen, CAD, dementia and a recent admission for Covid pneumonia. The patient was discharged on 03/02/2018 to a skilled nursing facility.  She was sent back to the ED because she seemed to be more sleepy and confused than her baseline.  Subjective: Quite sleepy.     Assessment & Plan:   Principal Problem:   Acute metabolic encephalopathy with underlying dementia - she is still quite sleepy. She has been dialyzed yesterday and will not be dialyzed until tomorrow- CBG is normal and BP is normal - will obtain an ABG - cont Aricept    Active Problems:    ESRD on HD- clotted access - she was declotted yesterday and underwent dialysis    Obstructive sleep apnea - she wore the CPAP last night  COPD with chronic hypoxemic respiratory failure on 2 L of O2 - stable - cont PRN nebs  Chronic Thrombocytopenia  - follow intermittently   HTN - holding Norvasc, Coreg, Hydralazine are on hold  GERD - on BID Protonix   Time spent in minutes: 35 DVT prophylaxis: Place and maintain sequential compression device Start: 03/09/20 1124 Code Status: Full code Family Communication:  Level of Care: Level of care: Telemetry Medical Disposition Plan:  Status is: Observation  The patient will require care spanning > 2 midnights and should be moved to inpatient because: IV treatments appropriate due to intensity of illness or inability to take PO  Dispo: The patient is from: SNF              Anticipated d/c is to: SNF              Anticipated d/c date is: > 3 days              Patient currently is not medically stable to d/c.   Difficult to place patient No  Consultants:   nephrology Procedures:   Declotting of  graft Antimicrobials:  Anti-infectives (From admission, onward)   None       Objective: Vitals:   03/10/20 0745 03/10/20 0824 03/10/20 1240 03/10/20 1253  BP: (!) 142/67  120/62 (!) 130/51  Pulse: 67   64  Resp: 18   18  Temp: 97.7 F (36.5 C)   97.8 F (36.6 C)  TempSrc: Oral   Oral  SpO2: 100% 100%  100%  Weight:        Intake/Output Summary (Last 24 hours) at 03/10/2020 1312 Last data filed at 03/09/2020 2100 Gross per 24 hour  Intake 240 ml  Output 1900 ml  Net -1660 ml   Filed Weights   03/09/20 1330 03/09/20 1655 03/10/20 0500  Weight: 111.2 kg 109 kg 109.9 kg    Examination: General exam: Appears comfortable - not waking up on exam HEENT: PERRLA, oral mucosa moist, no sclera icterus or thrush Respiratory system: Clear to auscultation. Respiratory effort normal. Cardiovascular system: S1 & S2 heard, RRR.   Gastrointestinal system: Abdomen soft, non-tender, nondistended. Normal bowel sounds. Central nervous system: not alert Extremities: No cyanosis, clubbing or edema Skin: No rashes or ulcers Psychiatry:  Cannot assess    Data Reviewed: I have personally reviewed following labs and imaging studies  CBC: Recent Labs  Lab 03/08/20 1441 03/09/20 1354 03/10/20 0348  WBC 7.6 5.3 5.8  NEUTROABS 3.7  --   --   HGB 7.5* 7.5* 7.1*  HCT 26.6* 25.3* 24.6*  MCV 79.2* 77.8* 77.8*  PLT 95* 103* 94*   Basic Metabolic Panel: Recent Labs  Lab 03/08/20 1441 03/09/20 1354 03/10/20 0348  NA 140 139 134*  K 4.7 4.8 4.1  CL 102 105 98  CO2 26 24 27   GLUCOSE 115* 188* 157*  BUN 34* 40* 26*  CREATININE 8.21* 9.27* 6.75*  CALCIUM 7.9* 7.6* 7.8*  PHOS  --  5.5*  --    GFR: Estimated Creatinine Clearance: 9.3 mL/min (A) (by C-G formula based on SCr of 6.75 mg/dL (H)). Liver Function Tests: Recent Labs  Lab 03/08/20 1441 03/09/20 1354  AST 11*  --   ALT 9  --   ALKPHOS 56  --   BILITOT 1.0  --   PROT 6.2*  --   ALBUMIN 2.3* 2.3*   No results for  input(s): LIPASE, AMYLASE in the last 168 hours. No results for input(s): AMMONIA in the last 168 hours. Coagulation Profile: No results for input(s): INR, PROTIME in the last 168 hours. Cardiac Enzymes: No results for input(s): CKTOTAL, CKMB, CKMBINDEX, TROPONINI in the last 168 hours. BNP (last 3 results) No results for input(s): PROBNP in the last 8760 hours. HbA1C: No results for input(s): HGBA1C in the last 72 hours. CBG: Recent Labs  Lab 03/09/20 1124 03/09/20 1807 03/09/20 2303 03/10/20 0635 03/10/20 1157  GLUCAP 147* 172* 214* 127* 120*   Lipid Profile: No results for input(s): CHOL, HDL, LDLCALC, TRIG, CHOLHDL, LDLDIRECT in the last 72 hours. Thyroid Function Tests: No results for input(s): TSH, T4TOTAL, FREET4, T3FREE, THYROIDAB in the last 72 hours. Anemia Panel: No results for input(s): VITAMINB12, FOLATE, FERRITIN, TIBC, IRON, RETICCTPCT in the last 72 hours. Urine analysis:    Component Value Date/Time   COLORURINE AMBER (A) 03/09/2020 0645   APPEARANCEUR CLOUDY (A) 03/09/2020 0645   LABSPEC 1.020 03/09/2020 0645   PHURINE 5.0 03/09/2020 0645   GLUCOSEU NEGATIVE 03/09/2020 0645   GLUCOSEU >=1000 01/02/2009 1101   HGBUR NEGATIVE 03/09/2020 0645   HGBUR negative 03/25/2008 1022   BILIRUBINUR NEGATIVE 03/09/2020 0645   KETONESUR NEGATIVE 03/09/2020 0645   PROTEINUR 100 (A) 03/09/2020 0645   UROBILINOGEN 0.2 09/14/2013 1632   NITRITE NEGATIVE 03/09/2020 0645   LEUKOCYTESUR TRACE (A) 03/09/2020 0645   Sepsis Labs: @LABRCNTIP (procalcitonin:4,lacticidven:4) )No results found for this or any previous visit (from the past 240 hour(s)).       Radiology Studies: CT Head Wo Contrast  Result Date: 03/09/2020 CLINICAL DATA:  Mental status change, unknown cause EXAM: CT HEAD WITHOUT CONTRAST TECHNIQUE: Contiguous axial images were obtained from the base of the skull through the vertex without intravenous contrast. COMPARISON:  Head CT 02/16/2020 FINDINGS: Brain:  Stable degree of atrophy and chronic small vessel ischemia. No intracranial hemorrhage, mass effect, or midline shift. No hydrocephalus. The basilar cisterns are patent. No evidence of territorial infarct or acute ischemia. No extra-axial or intracranial fluid collection. Vascular: Atherosclerosis of skullbase vasculature without hyperdense vessel or abnormal calcification. Skull: No fracture or focal lesion. Sinuses/Orbits: Similar fluid level in left side of sphenoid sinus. Mild mucosal thickening of maxillary sinuses. Mastoid air cells are clear. No acute orbital abnormality. Other: None. IMPRESSION: 1. No acute intracranial abnormality. 2. Stable atrophy and chronic small vessel ischemia. Electronically Signed   By: Keith Rake M.D.   On: 03/09/2020 04:00   DG Chest [facility]  Result Date: 03/09/2020 CLINICAL DATA:  Altered mental status EXAM: PORTABLE CHEST 1 VIEW COMPARISON:  One week ago FINDINGS: Cardiomegaly and vascular pedicle widening. Subpleural predominant airspace opacity greater to the right where there are lower lung volumes. Dialysis catheter with tip at the right atrium. Artifact from EKG leads IMPRESSION: Patchy bilateral pneumonia similar to 1 week prior. Electronically Signed   By: Monte Fantasia M.D.   On: 03/09/2020 04:02      Scheduled Meds: . Chlorhexidine Gluconate Cloth  6 each Topical Q0600  . docusate sodium  100 mg Oral BID  . donepezil  5 mg Oral QHS  . ezetimibe  10 mg Oral Daily  . fluticasone furoate-vilanterol  1 puff Inhalation Daily   And  . umeclidinium bromide  1 puff Inhalation Daily  . insulin aspart  0-6 Units Subcutaneous TID WC  . montelukast  10 mg Oral QHS  . pantoprazole  40 mg Oral BID  . sodium chloride flush  3 mL Intravenous Q12H   Continuous Infusions:   LOS: 0 days      Debbe Odea, MD [facility] Pager: [email or web address] 03/10/2020, 1:12 PM

## 2020-03-10 NOTE — Progress Notes (Signed)
Patient ID: Joann Gutierrez, female   DOB: 06/29/1945, 75 y.o.   MRN: [id number] [facility] Progress Note   Assessment/ Plan:   1.  Altered mental status (with baseline history of dementia): Etiology unclear without focal signs of infection-question raised as to whether this may be uremic encephalopathy with suboptimal dialysis from catheter dysfunction earlier in the week.  She did not have any significant improvement with dialysis yesterday.  CT scan of the head did not show any acute lesions on admission-unclear if needs repeat imaging or LP.  She is afebrile and without signs of meningitis. 2. ESRD: She is usually on a Tuesday/Thursday/Saturday schedule and underwent hemodialysis yesterday without problems-I have ordered for hemodialysis again tomorrow.  Unfortunately, no significant improvement of mental status with dialysis-medications reviewed; she is not on any high risk/sedated medications and have discontinued as needed Ambien. 3. Anemia: Without overt loss but some downtrending hemoglobin/hematocrit noted, status post ESA earlier this week on 2/15.  No indications for PRBC transfusion. 4. CKD-MBD: With borderline phosphorus level and corrected calcium at goal.  Not on vitamin D receptor analog. 5. Nutrition: Continue renal diet with fluid restrictions when able to satisfactorily eat without significant aspiration risk. 6. Hypertension: Blood pressure currently within acceptable range, continue to monitor with hemodialysis.  Subjective:   Somnolent overnight-awakens briefly on calling her name and without meaningful verbal response.   Objective:   BP (!) 142/67 (BP Location: Left Arm)   Pulse 67   Temp 97.7 F (36.5 C) (Oral)   Resp 18   Wt 109.9 kg   SpO2 100%   BMI 37.95 kg/m   Physical Exam: Gen: Somnolent, awakens on shaking her/calling her name. CVS: Pulse regular rhythm, normal rate, S1 and S2 normal Resp: Clear to auscultation bilaterally, no distinct  rales or rhonchi.  Right IJ TDC. Abd: Soft, obese, nontender, bowel sounds normal Ext: Bilateral SCDs, no pedal edema.  Right upper arm AV fistula with palpable thrill.  Labs: BMET Recent Labs  Lab 03/08/20 1441 03/09/20 1354 03/10/20 0348  NA 140 139 134*  K 4.7 4.8 4.1  CL 102 105 98  CO2 26 24 27   GLUCOSE 115* 188* 157*  BUN 34* 40* 26*  CREATININE 8.21* 9.27* 6.75*  CALCIUM 7.9* 7.6* 7.8*  PHOS  --  5.5*  --    CBC Recent Labs  Lab 03/08/20 1441 03/09/20 1354 03/10/20 0348  WBC 7.6 5.3 5.8  NEUTROABS 3.7  --   --   HGB 7.5* 7.5* 7.1*  HCT 26.6* 25.3* 24.6*  MCV 79.2* 77.8* 77.8*  PLT 95* 103* 94*     Medications:    . Chlorhexidine Gluconate Cloth  6 each Topical Q0600  . docusate sodium  100 mg Oral BID  . donepezil  5 mg Oral QHS  . ezetimibe  10 mg Oral Daily  . fluticasone furoate-vilanterol  1 puff Inhalation Daily   And  . umeclidinium bromide  1 puff Inhalation Daily  . insulin aspart  0-6 Units Subcutaneous TID WC  . montelukast  10 mg Oral QHS  . pantoprazole  40 mg Oral BID  . sodium chloride flush  3 mL Intravenous Q12H   [name], MD 03/10/2020, 8:34 AM

## 2020-03-10 NOTE — Telephone Encounter (Signed)
Called and spoke with pt's sister Trenton Founds who stated that pt will be at [facility] for Rehab for at least 20 days. She stated that after pt was recently hospitalized, she was sent there but then on 2/16, pt began to become disoriented and when O2 sats were checked, pt was satting at 65% as Trenton Founds stated that the nursing staff let O2 run out. Pt was also not on CPAP like she is supposed to be on.  Pt was then taken back to the hospital after this happened but Trenton Founds stated once pt is able to be discharged again from the hospital, she will be going back over to [facility] for rehab for at least 20 days.  Trenton Founds stated that she was told by the staff at the rehab center that in order for pt to be provided a cpap by the facility while she is there, a doctor must give an order/consent so this can take place.  Trenton Founds stated that the facility told them that pt could bring her cpap from home but she does not want to have to bring pt's equipment there and risk something happening to it while she is there and her not have it once she is able to return home.  Dr. Halford Chessman, please advise.

## 2020-03-10 NOTE — Progress Notes (Signed)
RT and NT at bedside, pt refused the ABG, combative, stable on 2 L [place].

## 2020-03-10 NOTE — Progress Notes (Signed)
Patient has been more lethargic/sleepy than usual this morning, difficult to keep awake but alert to voice/sound. Refusing to eat breakfast, MD is aware and updated. Vitals and CBG are stable. No new orders, will continue to monitor and assess.

## 2020-03-10 NOTE — Progress Notes (Signed)
Bladder scanned patient due to no urine in [place].  0 ml urine in bladder per scan x2.  Patient is ESRD on dialysis, had dialysis 2/17.

## 2020-03-10 NOTE — Progress Notes (Addendum)
New MD orders for pt to be on CPAP while sleeping, patient became agitated when stimulated and refused three times with respiratory to wear the CPAP. Placed pt back on 2L O2 via [place], MD updated.

## 2020-03-11 LAB — RENAL FUNCTION PANEL
Albumin: 2.2 g/dL — ABNORMAL LOW (ref 3.5–5.0)
Anion gap: 12 (ref 5–15)
BUN: 41 mg/dL — ABNORMAL HIGH (ref 8–23)
CO2: 25 mmol/L (ref 22–32)
Calcium: 8.1 mg/dL — ABNORMAL LOW (ref 8.9–10.3)
Chloride: 102 mmol/L (ref 98–111)
Creatinine, Ser: 8.85 mg/dL — ABNORMAL HIGH (ref 0.44–1.00)
GFR, Estimated: 4 mL/min — ABNORMAL LOW (ref >60)
Glucose, Bld: 195 mg/dL — ABNORMAL HIGH (ref 70–99)
Phosphorus: 5.3 mg/dL — ABNORMAL HIGH (ref 2.5–4.6)
Potassium: 4 mmol/L (ref 3.5–5.1)
Sodium: 139 mmol/L (ref 135–145)

## 2020-03-11 LAB — CBC
HCT: 25 % — ABNORMAL LOW (ref 36.0–46.0)
Hemoglobin: 7.1 g/dL — ABNORMAL LOW (ref 12.0–15.0)
MCH: 22.5 pg — ABNORMAL LOW (ref 26.0–34.0)
MCHC: 28.4 g/dL — ABNORMAL LOW (ref 30.0–36.0)
MCV: 79.4 fL — ABNORMAL LOW (ref 80.0–100.0)
Platelets: 117 10*3/uL — ABNORMAL LOW (ref 150–400)
RBC: 3.15 MIL/uL — ABNORMAL LOW (ref 3.87–5.11)
RDW: 23.7 % — ABNORMAL HIGH (ref 11.5–15.5)
WBC: 6.9 10*3/uL (ref 4.0–10.5)
nRBC: 0.6 % — ABNORMAL HIGH (ref 0.0–0.2)

## 2020-03-11 LAB — GLUCOSE, CAPILLARY
Glucose-Capillary: 125 mg/dL — ABNORMAL HIGH (ref 70–99)
Glucose-Capillary: 99 mg/dL (ref 70–99)
Glucose-Capillary: 110 mg/dL — ABNORMAL HIGH (ref 70–99)
Glucose-Capillary: 110 mg/dL — ABNORMAL HIGH (ref 70–99)
Glucose-Capillary: 167 mg/dL — ABNORMAL HIGH (ref 70–99)

## 2020-03-11 MED ORDER — POLYETHYLENE GLYCOL 3350 17 G PO PACK: 17.0000 g | PACK | Freq: Every day | ORAL | Status: DC | PRN

## 2020-03-11 MED ORDER — SORBITOL 70 % SOLN
30.0000 mL | Status: DC | PRN
  Filled 2020-03-11: qty 30

## 2020-03-11 MED ORDER — HEPARIN SODIUM (PORCINE) 1000 UNIT/ML DIALYSIS
40.0000 [IU]/kg | INTRAMUSCULAR | Status: DC | PRN
  Filled 2020-03-11: qty 5

## 2020-03-11 NOTE — Progress Notes (Signed)
Patient ID: Joann Gutierrez, female   DOB: [birth year], 75 y.o.   MRN: [id number] [facility] Progress Note   Assessment/ Plan:   1.  Altered mental status (with baseline history of dementia): Etiology unclear without focal signs of infection-question raised as to whether this may be uremic encephalopathy with suboptimal dialysis from catheter dysfunction earlier in the week versus possible lapse of adherence with CPAP and consequent CO2 narcosis.  Overnight with some improvement of mental status noted this morning. 2. ESRD: On hemodialysis on a TTS schedule-we will use dialysis catheter today to avoid fistula misadventures and to hopefully provide more optimized dose of dialysis/clearance. 3. Anemia: Without overt loss but some downtrending hemoglobin/hematocrit noted, status post ESA earlier this week on 2/15.  No indications for PRBC transfusion. 4. CKD-MBD: With borderline phosphorus level and corrected calcium at goal.  Not on vitamin D receptor analog. 5. Nutrition: Continue renal diet with fluid restrictions when able to satisfactorily eat without significant aspiration risk. 6. Hypertension: Blood pressure currently within acceptable range, continue to monitor with hemodialysis.  Subjective:   Noted to have been transiently combative yesterday refusing arterial blood gas.   Objective:   BP (!) 130/56 (BP Location: Left Wrist)   Pulse 65   Temp 98.3 F (36.8 C) (Axillary)   Resp 18   Wt 109.9 kg   SpO2 100%   BMI 37.95 kg/m   Physical Exam: Gen: Somnolent with CPAP, awakens to pain but not to calling out her name. CVS: Pulse regular rhythm, normal rate, S1 and S2 normal Resp: Clear to auscultation bilaterally, no distinct rales or rhonchi.  Right IJ TDC. Abd: Soft, obese, nontender, bowel sounds normal Ext: Bilateral SCDs, no pedal edema.  Right upper arm AV fistula with low quality palpable thrill.  Labs: BMET Recent Labs  Lab 03/08/20 1441 03/09/20 1354  03/10/20 0348  NA 140 139 134*  K 4.7 4.8 4.1  CL 102 105 98  CO2 26 24 27   GLUCOSE 115* 188* 157*  BUN 34* 40* 26*  CREATININE 8.21* 9.27* 6.75*  CALCIUM 7.9* 7.6* 7.8*  PHOS  --  5.5*  --    CBC Recent Labs  Lab 03/08/20 1441 03/09/20 1354 03/10/20 0348  WBC 7.6 5.3 5.8  NEUTROABS 3.7  --   --   HGB 7.5* 7.5* 7.1*  HCT 26.6* 25.3* 24.6*  MCV 79.2* 77.8* 77.8*  PLT 95* 103* 94*     Medications:    . Chlorhexidine Gluconate Cloth  6 each Topical Q0600  . docusate sodium  100 mg Oral BID  . donepezil  5 mg Oral QHS  . ezetimibe  10 mg Oral Daily  . fluticasone furoate-vilanterol  1 puff Inhalation Daily   And  . umeclidinium bromide  1 puff Inhalation Daily  . insulin aspart  0-6 Units Subcutaneous TID WC  . montelukast  10 mg Oral QHS  . pantoprazole  40 mg Oral BID  . sodium chloride flush  3 mL Intravenous Q12H   [name], MD 03/11/2020, 9:35 AM

## 2020-03-11 NOTE — Plan of Care (Signed)

## 2020-03-11 NOTE — Progress Notes (Signed)
PROGRESS NOTE    Joann Gutierrez   YJE:[id number]  DOB: 11/09/1945  DOA: 03/08/2020 PCP: Andree Moro, DO   Brief Narrative:  Joann Gutierrez is a 75 year old woman with end-stage renal disease, COPD with chronic hypoxemic respiratory failure on 2 L of oxygen, CAD, dementia and a recent admission for Covid pneumonia. The patient was discharged on 03/02/2018 to a skilled nursing facility.  She was sent back to the ED because she seemed to be more sleepy and confused than her baseline.  Subjective: Quite sleepy.     Assessment & Plan:   Principal Problem:   Acute metabolic encephalopathy with underlying dementia - much more alert today- will see if she will eat/drink and get out of bed today- if mental status continues to be stable, she can be discharged back to SNF - suspect she might have had CO2 narcosis- she became agitated when trying to attempt an ABG and thus this could not be done- she has worn the CPAP overnight and I feel this may be what has caused her to improved today - cont Aricept    Active Problems:    ESRD on HD- clotted access - she was declotted on Friday and she underwent dialysis - will have dialysis again today    Obstructive sleep apnea - she wore the CPAP last night  COPD with chronic hypoxemic respiratory failure on 2 L of O2 - stable - cont PRN nebs  Chronic Thrombocytopenia  - follow intermittently   HTN - holding Norvasc, Coreg, Hydralazine are on hold  GERD - on BID Protonix   Time spent in minutes: 35 DVT prophylaxis: Place and maintain sequential compression device Start: 03/09/20 1124 Code Status: Full code Family Communication:  Level of Care: Level of care: Telemetry Medical Disposition Plan:  Status is: Inpatient  The patient will require care spanning > 2 midnights and should be moved to inpatient because: Altered mental status and IV treatments appropriate due to intensity of illness or inability to take PO  Dispo: The  patient is from: SNF              Anticipated d/c is to: SNF              Anticipated d/c date is: > 3 days              Patient currently is not medically stable to d/c.   Difficult to place patient No  Consultants:   nephrology Procedures:   Declotting of graft Antimicrobials:  Anti-infectives (From admission, onward)   None       Objective: Vitals:   03/10/20 2306 03/11/20 0029 03/11/20 0436 03/11/20 0823  BP:  (!) 131/56 (!) 151/69 (!) 130/56  Pulse: 68 68 68 65  Resp: 14 19 19 18   Temp:  98.9 F (37.2 C) 98.1 F (36.7 C) 98.3 F (36.8 C)  TempSrc:  Oral  Axillary  SpO2: 98% 99% 100% 100%  Weight:       No intake or output data in the 24 hours ending 03/11/20 1043 Filed Weights   03/09/20 1330 03/09/20 1655 03/10/20 0500  Weight: 111.2 kg 109 kg 109.9 kg    Examination: General exam: Appears comfortable  HEENT: PERRLA, oral mucosa moist, no sclera icterus or thrush Respiratory system: Clear to auscultation. Respiratory effort normal. Cardiovascular system: S1 & S2 heard, regular rate and rhythm Gastrointestinal system: Abdomen soft, non-tender, nondistended. Normal bowel sounds   Central nervous system: Alert - No focal neurological deficits.  Extremities: No cyanosis, clubbing or edema Skin: No rashes or ulcers Psychiatry:  Mood & affect appropriate.    Data Reviewed: I have personally reviewed following labs and imaging studies  CBC: Recent Labs  Lab 03/08/20 1441 03/09/20 1354 03/10/20 0348  WBC 7.6 5.3 5.8  NEUTROABS 3.7  --   --   HGB 7.5* 7.5* 7.1*  HCT 26.6* 25.3* 24.6*  MCV 79.2* 77.8* 77.8*  PLT 95* 103* 94*   Basic Metabolic Panel: Recent Labs  Lab 03/08/20 1441 03/09/20 1354 03/10/20 0348  NA 140 139 134*  K 4.7 4.8 4.1  CL 102 105 98  CO2 26 24 27   GLUCOSE 115* 188* 157*  BUN 34* 40* 26*  CREATININE 8.21* 9.27* 6.75*  CALCIUM 7.9* 7.6* 7.8*  PHOS  --  5.5*  --    GFR: Estimated Creatinine Clearance: 9.3 mL/min (A) (by  C-G formula based on SCr of 6.75 mg/dL (H)). Liver Function Tests: Recent Labs  Lab 03/08/20 1441 03/09/20 1354  AST 11*  --   ALT 9  --   ALKPHOS 56  --   BILITOT 1.0  --   PROT 6.2*  --   ALBUMIN 2.3* 2.3*   No results for input(s): LIPASE, AMYLASE in the last 168 hours. No results for input(s): AMMONIA in the last 168 hours. Coagulation Profile: No results for input(s): INR, PROTIME in the last 168 hours. Cardiac Enzymes: No results for input(s): CKTOTAL, CKMB, CKMBINDEX, TROPONINI in the last 168 hours. BNP (last 3 results) No results for input(s): PROBNP in the last 8760 hours. HbA1C: No results for input(s): HGBA1C in the last 72 hours. CBG: Recent Labs  Lab 03/10/20 0635 03/10/20 1157 03/10/20 1547 03/10/20 2142 03/11/20 0710  GLUCAP 127* 120* 139* 191* 125*   Lipid Profile: No results for input(s): CHOL, HDL, LDLCALC, TRIG, CHOLHDL, LDLDIRECT in the last 72 hours. Thyroid Function Tests: No results for input(s): TSH, T4TOTAL, FREET4, T3FREE, THYROIDAB in the last 72 hours. Anemia Panel: No results for input(s): VITAMINB12, FOLATE, FERRITIN, TIBC, IRON, RETICCTPCT in the last 72 hours. Urine analysis:    Component Value Date/Time   COLORURINE AMBER (A) 03/09/2020 0645   APPEARANCEUR CLOUDY (A) 03/09/2020 0645   LABSPEC 1.020 03/09/2020 0645   PHURINE 5.0 03/09/2020 0645   GLUCOSEU NEGATIVE 03/09/2020 0645   GLUCOSEU >=1000 01/02/2009 1101   HGBUR NEGATIVE 03/09/2020 0645   HGBUR negative 03/25/2008 1022   BILIRUBINUR NEGATIVE 03/09/2020 0645   KETONESUR NEGATIVE 03/09/2020 0645   PROTEINUR 100 (A) 03/09/2020 0645   UROBILINOGEN 0.2 09/14/2013 1632   NITRITE NEGATIVE 03/09/2020 0645   LEUKOCYTESUR TRACE (A) 03/09/2020 0645   Sepsis Labs: @LABRCNTIP (procalcitonin:4,lacticidven:4) )No results found for this or any previous visit (from the past 240 hour(s)).       Radiology Studies: No results found.    Scheduled Meds: . Chlorhexidine  Gluconate Cloth  6 each Topical Q0600  . docusate sodium  100 mg Oral BID  . donepezil  5 mg Oral QHS  . ezetimibe  10 mg Oral Daily  . fluticasone furoate-vilanterol  1 puff Inhalation Daily   And  . umeclidinium bromide  1 puff Inhalation Daily  . insulin aspart  0-6 Units Subcutaneous TID WC  . montelukast  10 mg Oral QHS  . pantoprazole  40 mg Oral BID  . sodium chloride flush  3 mL Intravenous Q12H   Continuous Infusions:   LOS: 1 day      Debbe Odea, MD [facility] Pager:  [email or web address] 03/11/2020, 10:43 AM

## 2020-03-11 NOTE — Progress Notes (Signed)
Patient refuses to take  any of her  Medicine. She says " I have taken too many medicines today". Another RN try to talk patient into taking medicines but was not successful.

## 2020-03-12 DIAGNOSIS — G9340 Encephalopathy, unspecified: Secondary | ICD-10-CM

## 2020-03-12 DIAGNOSIS — E7800 Pure hypercholesterolemia, unspecified: Secondary | ICD-10-CM

## 2020-03-12 DIAGNOSIS — I10 Essential (primary) hypertension: Secondary | ICD-10-CM

## 2020-03-12 LAB — GLUCOSE, CAPILLARY
Glucose-Capillary: 111 mg/dL — ABNORMAL HIGH (ref 70–99)
Glucose-Capillary: 207 mg/dL — ABNORMAL HIGH (ref 70–99)
Glucose-Capillary: 144 mg/dL — ABNORMAL HIGH (ref 70–99)
Glucose-Capillary: 176 mg/dL — ABNORMAL HIGH (ref 70–99)

## 2020-03-12 MED ORDER — BISACODYL 5 MG PO TBEC: 5.0000 mg | DELAYED_RELEASE_TABLET | Freq: Every day | ORAL | 0 refills | Status: DC | PRN

## 2020-03-12 NOTE — Progress Notes (Signed)
Patient ID: Joann Gutierrez, female   DOB: 01-Apr-1945, 75 y.o.   MRN: [id number] [facility] Progress Note   Assessment/ Plan:   1.  Altered mental status (with baseline history of dementia): Etiology unclear with question raised regarding uremic encephalopathy secondary to suboptimal dialysis from catheter dysfunction earlier in the week versus possible lapse of adherence with CPAP and consequent CO2 narcosis.  Mental status appears to wax and wane. 2. ESRD: On hemodialysis on a TTS schedule and last underwent dialysis yesterday via IJ TDC.  Next hemodialysis treatment due 2/22. 3. Anemia: Without overt loss with a low but stable hemoglobin/hematocrit noted, status post ESA as an outpatient on 2/15.  No indications for PRBC transfusion. 4. CKD-MBD: With borderline phosphorus level and corrected calcium at goal-not on phosphorus binder.  Not on vitamin D receptor analog. 5. Nutrition: Continue renal diet with fluid restrictions when able to satisfactorily eat without significant aspiration risk. 6. Hypertension: Blood pressure marginally elevated, will monitor with HD/UF and decide on need for antihypertensive therapy as an outpatient.  Subjective:   Tolerated hemodialysis yesterday without problems.  Noted to have refused medications overnight-poor insight into events from overnight.   Objective:   BP (!) 156/83 (BP Location: Left Arm)   Pulse 67   Temp 98.2 F (36.8 C)   Resp 20   Wt 109.9 kg   SpO2 98%   BMI 37.95 kg/m   Physical Exam: Gen: Sitting up comfortably in bed, eating breakfast.  Inappropriate verbal responses to questions-not oriented to place/person. CVS: Pulse regular rhythm, normal rate, S1 and S2 normal Resp: Clear to auscultation bilaterally, no distinct rales or rhonchi.  Right IJ TDC. Abd: Soft, obese, nontender, bowel sounds normal Ext: Bilateral SCDs, no pedal edema.  Right upper arm AV fistula with low quality palpable  thrill.  Labs: BMET Recent Labs  Lab 03/08/20 1441 03/09/20 1354 03/10/20 0348 03/11/20 1346  NA 140 139 134* 139  K 4.7 4.8 4.1 4.0  CL 102 105 98 102  CO2 26 24 27 25   GLUCOSE 115* 188* 157* 195*  BUN 34* 40* 26* 41*  CREATININE 8.21* 9.27* 6.75* 8.85*  CALCIUM 7.9* 7.6* 7.8* 8.1*  PHOS  --  5.5*  --  5.3*   CBC Recent Labs  Lab 03/08/20 1441 03/09/20 1354 03/10/20 0348 03/11/20 1346  WBC 7.6 5.3 5.8 6.9  NEUTROABS 3.7  --   --   --   HGB 7.5* 7.5* 7.1* 7.1*  HCT 26.6* 25.3* 24.6* 25.0*  MCV 79.2* 77.8* 77.8* 79.4*  PLT 95* 103* 94* 117*     Medications:    . Chlorhexidine Gluconate Cloth  6 each Topical Q0600  . docusate sodium  100 mg Oral BID  . donepezil  5 mg Oral QHS  . ezetimibe  10 mg Oral Daily  . fluticasone furoate-vilanterol  1 puff Inhalation Daily   And  . umeclidinium bromide  1 puff Inhalation Daily  . insulin aspart  0-6 Units Subcutaneous TID WC  . montelukast  10 mg Oral QHS  . pantoprazole  40 mg Oral BID  . sodium chloride flush  3 mL Intravenous Q12H   [name], MD 03/12/2020, 8:30 AM

## 2020-03-12 NOTE — Discharge Summary (Signed)
Physician Discharge Summary  TULA SCHRYVER TIR:[id number] DOB: October 30, 1945 DOA: 03/08/2020  PCP: Andree Moro, DO  Admit date: 03/08/2020 Discharge date: 03/12/2020  Admitted From: SNF Disposition:  SNF   Recommendations for Outpatient Follow-up:  1. Ensure patient wears CPAP at all times when sleeping   Discharge Condition:  stable   CODE STATUS:  Full code   Diet recommendation:  Renal and diabetic diet Consultations:  nephrology  Procedures/Studies: . none   Discharge Diagnoses:  Principal Problem:   Acute metabolic encephalopathy Active Problems:   Dementia with behavioral disturbance ([place])   HYPERCHOLESTEROLEMIA   Obstructive sleep apnea   Essential hypertension   Diabetes mellitus type 2 in obese ([place])   COVID-19   ESRD on hemodialysis ([facility])   Thrombocytopenia ([place])     Brief Summary: Joann Gutierrez is a 75 year old woman with end-stage renal disease, COPD with chronic hypoxemic respiratory failure on 2 L of oxygen, CAD, dementia and a recent admission for Covid pneumonia. The patient was discharged on 03/02/2018 to a skilled nursing facility.  She was sent back to the ED because she seemed to be more sleepy and confused than her baseline.  Hospital Course:  Principal Problem:   Acute metabolic encephalopathy with underlying dementia - suspect she might have had CO2 narcosis- she became agitated when trying to attempt an ABG and thus this could not be done- she has worn the CPAP overnight and I feel this may be what has caused her to improve - she has returned back to baseline as of 2/19 and has remained alert - she is eating and drinking - cont Aricept for underlying demenita  Active Problems:    ESRD on HD- clotted access - she was declotted on Friday and she underwent dialysis on Friday and again on Saturday    Obstructive sleep apnea - she wore the CPAP last night  COPD with chronic hypoxemic respiratory failure on 2 L of O2 - stable -  cont PRN nebs  Chronic Thrombocytopenia  - follow intermittently   HTN - holding Norvasc, Coreg, Hydralazine are on hold  GERD - on BID Protonix   Discharge Exam: Vitals:   03/12/20 0814 03/12/20 1220  BP: (!) 156/83 (!) 175/77  Pulse: 67 68  Resp: 20 18  Temp: 98.3 F (36.8 C) 97.7 F (36.5 C)  SpO2: 98% (!) 85%   Vitals:   03/12/20 0010 03/12/20 0423 03/12/20 0814 03/12/20 1220  BP:  (!) 153/67 (!) 156/83 (!) 175/77  Pulse:  75 67 68  Resp: 16 18 20 18   Temp:  98.2 F (36.8 C) 98.3 F (36.8 C) 97.7 F (36.5 C)  TempSrc:   Oral   SpO2:  90% 98% (!) 85%  Weight:        General: Pt is alert, awake, not in acute distress Cardiovascular: RRR, S1/S2 +, no rubs, no gallops Respiratory: CTA bilaterally, no wheezing, no rhonchi Abdominal: Soft, NT, ND, bowel sounds + Extremities: no edema, no cyanosis   Discharge Instructions  Discharge Instructions    Increase activity slowly   Complete by: As directed      Allergies as of 03/12/2020      Reactions   Morphine And Related Other (See Comments)   Family request not to be given, reports pt does not wake up when given    Promethazine Hcl Other (See Comments)   REACTION: lethargy      Medication List    TAKE these medications   acetaminophen 325 MG  tablet Commonly known as: TYLENOL Take 650 mg by mouth every 4 (four) hours as needed for moderate pain.   albuterol 108 (90 Base) MCG/ACT inhaler Commonly known as: VENTOLIN HFA Inhale 2 puffs into the lungs every 4 (four) hours as needed for wheezing or shortness of breath.   amLODipine 5 MG tablet Commonly known as: NORVASC Take 1 tablet (5 mg total) by mouth every evening.   ascorbic acid 500 MG tablet Commonly known as: VITAMIN C Take 1 tablet (500 mg total) by mouth daily.   aspirin EC 81 MG tablet Take 81 mg by mouth daily.   azelastine 0.1 % nasal spray Commonly known as: ASTELIN Place 1 spray into both nostrils 2 (two) times daily as needed  (allergies). Use in each nostril as directed   bisacodyl 5 MG EC tablet Commonly known as: DULCOLAX Take 1 tablet (5 mg total) by mouth daily as needed for moderate constipation.   calcium carbonate 1500 (600 Ca) MG Tabs tablet Commonly known as: OSCAL Take 1 tablet by mouth daily.   carvedilol 25 MG tablet Commonly known as: COREG Take 25 mg by mouth 2 (two) times daily.   donepezil 5 MG tablet Commonly known as: ARICEPT Take 1 tablet (5 mg total) by mouth at bedtime.   ezetimibe 10 MG tablet Commonly known as: ZETIA Take 10 mg by mouth daily.   ferrous sulfate 325 (65 FE) MG tablet Take 325 mg by mouth daily.   gabapentin 100 MG capsule Commonly known as: NEURONTIN Take 1 capsule (100 mg total) by mouth 2 (two) times daily. What changed: when to take this   guaiFENesin-dextromethorphan 100-10 MG/5ML syrup Commonly known as: ROBITUSSIN DM Take 10 mLs by mouth every 4 (four) hours as needed for cough.   hydrALAZINE 100 MG tablet Commonly known as: APRESOLINE Take 1 tablet by mouth 3 (three) times daily.   Ipratropium-Albuterol 20-100 MCG/ACT Aers respimat Commonly known as: COMBIVENT Inhale 1 puff into the lungs every 6 (six) hours as needed for wheezing or shortness of breath.   lip balm ointment Apply topically as needed for lip care.   montelukast 10 MG tablet Commonly known as: SINGULAIR Take 1 tablet (10 mg total) by mouth daily. What changed: when to take this   nitroGLYCERIN 0.4 MG SL tablet Commonly known as: NITROSTAT PLACE ONE TABLET UNDER THE TONGUE EVERY FIVE MINUTES AS NEEDED FOR CHEST PAIN What changed: See the new instructions.   omeprazole 40 MG capsule Commonly known as: PRILOSEC Take 1 capsule (40 mg total) by mouth in the morning and at bedtime. Please schedule a yearly follow up: [phone]. Thank you   OXYGEN Inhale 2 L into the lungs as needed. CPAP with oxygen at bedtime   polyethylene glycol 17 g packet Commonly known as:  MiraLax Take 17 g by mouth daily as needed. What changed: reasons to take this   Systane Balance 0.6 % Soln Generic drug: Propylene Glycol Place 1 drop into both eyes daily as needed (dry eyes).   Trelegy Ellipta 100-62.5-25 MCG/INH Aepb Generic drug: Fluticasone-Umeclidin-Vilant Take 1 puff by mouth daily.   TRUEplus Pen Needles 32G X 4 MM Misc Generic drug: Insulin Pen Needle INJECT THREE TIMES A DAY AS DIRECTED   Vitamin D3 25 MCG tablet Commonly known as: Vitamin D Take 1 tablet (1,000 Units total) by mouth daily.   zinc sulfate 220 (50 Zn) MG capsule Take 1 capsule (220 mg total) by mouth daily.       Contact information  for after-discharge care    Destination    [facility] Preferred SNF .   Service: Skilled Nursing Contact information: [address] [address] [phone]                 Allergies  Allergen Reactions  . Morphine And Related Other (See Comments)    Family request not to be given, reports pt does not wake up when given   . Promethazine Hcl Other (See Comments)    REACTION: lethargy      DG Abd 1 View  Result Date: 02/21/2020 CLINICAL DATA:  Abdominal pain. EXAM: ABDOMEN - 1 VIEW COMPARISON:  None. FINDINGS: The bowel gas pattern is normal. No radio-opaque calculi or other significant radiographic abnormality are seen. IMPRESSION: Negative. Electronically Signed   By: Kerby Moors M.D.   On: 02/21/2020 11:28   CT Head Wo Contrast  Result Date: 03/09/2020 CLINICAL DATA:  Mental status change, unknown cause EXAM: CT HEAD WITHOUT CONTRAST TECHNIQUE: Contiguous axial images were obtained from the base of the skull through the vertex without intravenous contrast. COMPARISON:  Head CT 02/16/2020 FINDINGS: Brain: Stable degree of atrophy and chronic small vessel ischemia. No intracranial hemorrhage, mass effect, or midline shift. No hydrocephalus. The basilar cisterns are patent. No evidence of territorial  infarct or acute ischemia. No extra-axial or intracranial fluid collection. Vascular: Atherosclerosis of skullbase vasculature without hyperdense vessel or abnormal calcification. Skull: No fracture or focal lesion. Sinuses/Orbits: Similar fluid level in left side of sphenoid sinus. Mild mucosal thickening of maxillary sinuses. Mastoid air cells are clear. No acute orbital abnormality. Other: None. IMPRESSION: 1. No acute intracranial abnormality. 2. Stable atrophy and chronic small vessel ischemia. Electronically Signed   By: Keith Rake M.D.   On: 03/09/2020 04:00   CT HEAD WO CONTRAST  Result Date: 02/16/2020 CLINICAL DATA:  Altered mental status, somnolence EXAM: CT HEAD WITHOUT CONTRAST TECHNIQUE: Contiguous axial images were obtained from the base of the skull through the vertex without intravenous contrast. COMPARISON:  Eight/19/19 FINDINGS: Brain: Normal anatomic configuration. Parenchymal volume loss is commensurate with the patient's age and stable since prior examination. Stable moderate periventricular white matter changes are present likely reflecting the sequela of small vessel ischemia. Tiny remote infarct within the a left cerebellar hemisphere is again noted. No abnormal intra or extra-axial mass lesion or fluid collection. No abnormal mass effect or midline shift. No evidence of acute intracranial hemorrhage or infarct. Ventricular size is normal. Cerebellum unremarkable. Vascular: No asymmetric hyperdense vasculature at the skull base. Skull: Intact Sinuses/Orbits: Mild mucosal thickening within the right maxillary sinus. Small layering mucus within the left sphenoid sinus. Remaining paranasal sinuses are clear. Orbits are unremarkable. Other: Mastoid air cells and middle ear cavities are clear. IMPRESSION: No acute intracranial abnormality. Stable senescent changes.  Remote tiny left cerebellar infarct. Minimal paranasal sinus disease. Electronically Signed   By: Fidela Salisbury MD   On:  02/16/2020 05:32   US RENAL  Result Date: 02/16/2020 CLINICAL DATA:  Acute renal injury. EXAM: RENAL / URINARY TRACT ULTRASOUND COMPLETE COMPARISON:  Ultrasound 03/19/2019. FINDINGS: Right Kidney: Renal measurements: 9.4 x 4.5 x 4.3 cm = volume: 93.6 mL. Mild increased echogenicity cannot be excluded. No mass or hydronephrosis visualized. Left Kidney: Renal measurements: 9.1 x 4.7 x 4.2 cm = volume: 94.8 mL. Mild increased echogenicity cannot be excluded. 3.6 cm simple cyst lower pole. No hydronephrosis visualized. Bladder: Appears normal for degree of bladder distention. Other: None. IMPRESSION: 1. Mild  increased echogenicity both kidneys suggesting chronic renal disease cannot be excluded. 2. 3.6 cm simple cyst lower pole left kidney. No acute abnormality. No hydronephrosis. No bladder distention. Electronically Signed   By: Marcello Moores  Register   On: 02/16/2020 05:14   IR US Guide Vasc Access Right  Result Date: 02/18/2020 CLINICAL DATA:  Acute kidney injury, COVID-19 infection and need for hemodialysis. An indwelling AV fistula could not be used due to infiltration. EXAM: TUNNELED CENTRAL VENOUS HEMODIALYSIS CATHETER PLACEMENT WITH ULTRASOUND AND FLUOROSCOPIC GUIDANCE ANESTHESIA/SEDATION: 1.0 mg IV Versed; 25 mcg IV Fentanyl. Total Moderate Sedation Time:   15 minutes. The patient's level of consciousness and physiologic status were continuously monitored during the procedure by Radiology nursing. MEDICATIONS: 2 g IV Ancef. FLUOROSCOPY TIME:  Hiking seconds.  2.2 mGy. PROCEDURE: The procedure, risks, benefits, and alternatives were explained to the patient. Questions regarding the procedure were encouraged and answered. The patient understands and consents to the procedure. A timeout was performed prior to initiating the procedure. The right neck and chest were prepped with chlorhexidine in a sterile fashion, and a sterile drape was applied covering the operative field. Maximum barrier sterile technique with  sterile gowns and gloves were used for the procedure. Local anesthesia was provided with 1% lidocaine. Ultrasound was used to confirm patency of the right internal jugular vein. After creating a small venotomy incision, a 21 gauge needle was advanced into the right internal jugular vein under direct, real-time ultrasound guidance. Ultrasound image documentation was performed. After securing guidewire access, an 8 Fr dilator was placed. A J-wire was kinked to measure appropriate catheter length. A palindrome tunneled hemodialysis catheter measuring 19 cm from tip to cuff was chosen for placement. This was tunneled in a retrograde fashion from the chest wall to the venotomy incision. At the venotomy, serial dilatation was performed and a 50 Fr peel-away sheath was placed over a guidewire. The catheter was then placed through the sheath and the sheath removed. Final catheter positioning was confirmed and documented with a fluoroscopic spot image. The catheter was aspirated, flushed with saline, and injected with appropriate volume heparin dwells. The venotomy incision was closed with subcuticular 4-0 Vicryl. Dermabond was applied to the incision. The catheter exit site was secured with 0-Prolene retention sutures. COMPLICATIONS: None.  No pneumothorax. FINDINGS: After catheter placement, the tip lies in the right atrium. The catheter aspirates normally and is ready for immediate use. IMPRESSION: Placement of tunneled hemodialysis catheter via the right internal jugular vein. The catheter tip lies in the right atrium. The catheter is ready for immediate use. Electronically Signed   By: Aletta Edouard M.D.   On: 02/18/2020 08:27   DG Chest Port 1 View  Result Date: 03/09/2020 CLINICAL DATA:  Altered mental status EXAM: PORTABLE CHEST 1 VIEW COMPARISON:  One week ago FINDINGS: Cardiomegaly and vascular pedicle widening. Subpleural predominant airspace opacity greater to the right where there are lower lung volumes.  Dialysis catheter with tip at the right atrium. Artifact from EKG leads IMPRESSION: Patchy bilateral pneumonia similar to 1 week prior. Electronically Signed   By: Monte Fantasia M.D.   On: 03/09/2020 04:02   DG CHEST PORT 1 VIEW  Result Date: 03/02/2020 CLINICAL DATA:  Short of breath EXAM: PORTABLE CHEST 1 VIEW COMPARISON:  02/29/2020 FINDINGS: Bibasilar airspace disease with mild interval improvement. No significant effusion. Right jugular central venous catheter tip in the right atrium unchanged. No pneumothorax. IMPRESSION: Mild improvement in bibasilar airspace disease right greater than left. Electronically Signed  By: Franchot Gallo M.D.   On: 03/02/2020 08:08   DG CHEST PORT 1 VIEW  Result Date: 02/29/2020 CLINICAL DATA:  COVID-19 positive.  Hypertension. EXAM: PORTABLE CHEST 1 VIEW COMPARISON:  February 15, 2020 FINDINGS: There are areas of ill-defined opacity in each lower lung region without appreciable consolidation. Heart is mildly enlarged with pulmonary vascularity normal. No adenopathy. Central catheter tip is in the right atrium. No pneumothorax. No bone lesions. IMPRESSION: Areas of ill-defined opacity in the lower lung regions, concerning for atypical organism pneumonia in these areas. No consolidation. Stable cardiomegaly. Central catheter tip in right atrium. No pneumothorax. Electronically Signed   By: Lowella Grip III M.D.   On: 02/29/2020 10:37   DG Chest Portable 1 View  Result Date: 02/15/2020 CLINICAL DATA:  75 year old female with shortness of breath. EXAM: PORTABLE CHEST 1 VIEW COMPARISON:  Chest radiograph dated 11/20/2017 and 10/06/2017. FINDINGS: Bilateral streaky and confluent densities, right greater left, most consistent with developing infiltrate, possibly viral or atypical in etiology. Clinical correlation is recommended. No large pleural effusion pneumothorax. Mild cardiomegaly. No acute osseous pathology. IMPRESSION: Findings most consistent with developing  infiltrate predominantly involving the right lung. Electronically Signed   By: Anner Crete M.D.   On: 02/15/2020 21:02   VAS [place] LOWER EXTREMITY VENOUS (DVT) (ONLY [facility] & [facility])  Result Date: 02/16/2020  Lower Venous DVT Study Other Indications: D-Dimer. Risk Factors: None identified. Limitations: Body habitus and Pt unable to tolerate any pressure on legs. Comparison Study: 11/17 Left Negative Performing Technologist: Vonzell Schlatter RVT  Examination Guidelines: A complete evaluation includes B-mode imaging, spectral Doppler, color Doppler, and power Doppler as needed of all accessible portions of each vessel. Bilateral testing is considered an integral part of a complete examination. Limited examinations for reoccurring indications may be performed as noted. The reflux portion of the exam is performed with the patient in reverse Trendelenburg.  +-----+---------------+---------+-----------+----------+-------------------+ RIGHTCompressibilityPhasicitySpontaneityPropertiesThrombus Aging      +-----+---------------+---------+-----------+----------+-------------------+ CFV  Full           Yes      Yes                                      +-----+---------------+---------+-----------+----------+-------------------+ SFJ  Full                                                             +-----+---------------+---------+-----------+----------+-------------------+ POP  Full           Yes      Yes                                      +-----+---------------+---------+-----------+----------+-------------------+ PERO                                              Not well visualized +-----+---------------+---------+-----------+----------+-------------------+ Adequate Color fill in Femoral vein and posterior tibials. Patient unable to tolerate compression  +----+---------------+---------+-----------+----------+-------------------+ LEFTCompressibilityPhasicitySpontaneityPropertiesThrombus Aging       +----+---------------+---------+-----------+----------+-------------------+ CFV Full  Yes      Yes                                      +----+---------------+---------+-----------+----------+-------------------+ SFJ Full                                                             +----+---------------+---------+-----------+----------+-------------------+ POP Full           Yes      Yes                                      +----+---------------+---------+-----------+----------+-------------------+ PTV                                              Not well visualized +----+---------------+---------+-----------+----------+-------------------+ PERO                                             Not well visualized +----+---------------+---------+-----------+----------+-------------------+ Adequate color fill of Femoral vein due to patients inability to tolerate touch or compressions. Calf veins not seen  Summary: RIGHT: - There is no evidence of deep vein thrombosis in the lower extremity. However, portions of this examination were limited- see technologist comments above.  - No cystic structure found in the popliteal fossa.  LEFT: - There is no evidence of deep vein thrombosis in the lower extremity. However, portions of this examination were limited- see technologist comments above.  - No cystic structure found in the popliteal fossa.  *See table(s) above for measurements and observations. Electronically signed by Harold Barban MD on 02/16/2020 at 9:40:57 PM.    Final    IR TUNNELED CENTRAL VENOUS CATHETER PLACEMENT  Result Date: 02/18/2020 CLINICAL DATA:  Acute kidney injury, COVID-19 infection and need for hemodialysis. An indwelling AV fistula could not be used due to infiltration. EXAM: TUNNELED CENTRAL VENOUS HEMODIALYSIS CATHETER PLACEMENT WITH ULTRASOUND AND FLUOROSCOPIC GUIDANCE ANESTHESIA/SEDATION: 1.0 mg IV Versed; 25 mcg IV Fentanyl. Total Moderate Sedation  Time:   15 minutes. The patient's level of consciousness and physiologic status were continuously monitored during the procedure by Radiology nursing. MEDICATIONS: 2 g IV Ancef. FLUOROSCOPY TIME:  Hiking seconds.  2.2 mGy. PROCEDURE: The procedure, risks, benefits, and alternatives were explained to the patient. Questions regarding the procedure were encouraged and answered. The patient understands and consents to the procedure. A timeout was performed prior to initiating the procedure. The right neck and chest were prepped with chlorhexidine in a sterile fashion, and a sterile drape was applied covering the operative field. Maximum barrier sterile technique with sterile gowns and gloves were used for the procedure. Local anesthesia was provided with 1% lidocaine. Ultrasound was used to confirm patency of the right internal jugular vein. After creating a small venotomy incision, a 21 gauge needle was advanced into the right internal jugular vein under direct, real-time ultrasound guidance. Ultrasound image documentation was performed. After securing guidewire access,  an 8 Fr dilator was placed. A J-wire was kinked to measure appropriate catheter length. A palindrome tunneled hemodialysis catheter measuring 19 cm from tip to cuff was chosen for placement. This was tunneled in a retrograde fashion from the chest wall to the venotomy incision. At the venotomy, serial dilatation was performed and a 50 Fr peel-away sheath was placed over a guidewire. The catheter was then placed through the sheath and the sheath removed. Final catheter positioning was confirmed and documented with a fluoroscopic spot image. The catheter was aspirated, flushed with saline, and injected with appropriate volume heparin dwells. The venotomy incision was closed with subcuticular 4-0 Vicryl. Dermabond was applied to the incision. The catheter exit site was secured with 0-Prolene retention sutures. COMPLICATIONS: None.  No pneumothorax.  FINDINGS: After catheter placement, the tip lies in the right atrium. The catheter aspirates normally and is ready for immediate use. IMPRESSION: Placement of tunneled hemodialysis catheter via the right internal jugular vein. The catheter tip lies in the right atrium. The catheter is ready for immediate use. Electronically Signed   By: Aletta Edouard M.D.   On: 02/18/2020 08:27     The results of significant diagnostics from this hospitalization (including imaging, microbiology, ancillary and laboratory) are listed below for reference.     Microbiology: No results found for this or any previous visit (from the past 240 hour(s)).   Labs: BNP (last 3 results) Recent Labs    02/15/20 2044  BNP 27.2   Basic Metabolic Panel: Recent Labs  Lab 03/08/20 1441 03/09/20 1354 03/10/20 0348 03/11/20 1346  NA 140 139 134* 139  K 4.7 4.8 4.1 4.0  CL 102 105 98 102  CO2 26 24 27 25   GLUCOSE 115* 188* 157* 195*  BUN 34* 40* 26* 41*  CREATININE 8.21* 9.27* 6.75* 8.85*  CALCIUM 7.9* 7.6* 7.8* 8.1*  PHOS  --  5.5*  --  5.3*   Liver Function Tests: Recent Labs  Lab 03/08/20 1441 03/09/20 1354 03/11/20 1346  AST 11*  --   --   ALT 9  --   --   ALKPHOS 56  --   --   BILITOT 1.0  --   --   PROT 6.2*  --   --   ALBUMIN 2.3* 2.3* 2.2*   No results for input(s): LIPASE, AMYLASE in the last 168 hours. No results for input(s): AMMONIA in the last 168 hours. CBC: Recent Labs  Lab 03/08/20 1441 03/09/20 1354 03/10/20 0348 03/11/20 1346  WBC 7.6 5.3 5.8 6.9  NEUTROABS 3.7  --   --   --   HGB 7.5* 7.5* 7.1* 7.1*  HCT 26.6* 25.3* 24.6* 25.0*  MCV 79.2* 77.8* 77.8* 79.4*  PLT 95* 103* 94* 117*   Cardiac Enzymes: No results for input(s): CKTOTAL, CKMB, CKMBINDEX, TROPONINI in the last 168 hours. BNP: Invalid input(s): POCBNP CBG: Recent Labs  Lab 03/11/20 1702 03/11/20 1725 03/11/20 2123 03/12/20 0642 03/12/20 1217  GLUCAP 110* 110* 167* 111* 207*   D-Dimer No results for  input(s): DDIMER in the last 72 hours. Hgb A1c No results for input(s): HGBA1C in the last 72 hours. Lipid Profile No results for input(s): CHOL, HDL, LDLCALC, TRIG, CHOLHDL, LDLDIRECT in the last 72 hours. Thyroid function studies No results for input(s): TSH, T4TOTAL, T3FREE, THYROIDAB in the last 72 hours.  Invalid input(s): FREET3 Anemia work up No results for input(s): VITAMINB12, FOLATE, FERRITIN, TIBC, IRON, RETICCTPCT in the last 72 hours. Urinalysis  Component Value Date/Time   COLORURINE AMBER (A) 03/09/2020 0645   APPEARANCEUR CLOUDY (A) 03/09/2020 0645   LABSPEC 1.020 03/09/2020 0645   PHURINE 5.0 03/09/2020 0645   GLUCOSEU NEGATIVE 03/09/2020 0645   GLUCOSEU >=1000 01/02/2009 1101   HGBUR NEGATIVE 03/09/2020 0645   HGBUR negative 03/25/2008 1022   BILIRUBINUR NEGATIVE 03/09/2020 0645   KETONESUR NEGATIVE 03/09/2020 0645   PROTEINUR 100 (A) 03/09/2020 0645   UROBILINOGEN 0.2 09/14/2013 1632   NITRITE NEGATIVE 03/09/2020 0645   LEUKOCYTESUR TRACE (A) 03/09/2020 0645   Sepsis Labs Invalid input(s): PROCALCITONIN,  WBC,  [facility] Microbiology No results found for this or any previous visit (from the past 240 hour(s)).   Time coordinating discharge in minutes: 65  SIGNED:   Debbe Odea, MD  [facility] 03/12/2020, 1:46 PM

## 2020-03-12 NOTE — TOC Progression Note (Signed)
Transition of Care [facility]) - Progression Note    Patient Details  Name: Joann Gutierrez MRN: [id number] Date of Birth: Nov 13, 1945  [facility]) CM/SW Contact  Coralee Pesa, [place] Phone Number: 03/12/2020, 4:09 PM  Clinical Narrative:     CSW was notified by RN that a DC summary had been put in. Unfortunately admissions for this facility had left for the day. Pt will not need a covid as she tested positive 1/25. MD stated she could go whenever the facility can take her. Plan for DC 2/21. SW will continue to follow.       Expected Discharge Plan and Services           Expected Discharge Date: 03/12/20                                     Social Determinants of Health (SDOH) Interventions    Readmission Risk Interventions No flowsheet data found.

## 2020-03-12 NOTE — Plan of Care (Signed)
?  Problem: Clinical Measurements: ?Goal: Ability to maintain clinical measurements within normal limits will improve ?Outcome: Progressing ?Goal: Will remain free from infection ?Outcome: Progressing ?Goal: Diagnostic test results will improve ?Outcome: Progressing ?  ?

## 2020-03-13 LAB — GLUCOSE, CAPILLARY: Glucose-Capillary: 138 mg/dL — ABNORMAL HIGH (ref 70–99)

## 2020-03-13 NOTE — Progress Notes (Signed)
Spoke with [place]. Left my phone number for the RN to call me back for report. Report was attempted x2, patient is transported at his time

## 2020-03-13 NOTE — Progress Notes (Signed)
Attempted report x 1, secretary answers but nurse does not

## 2020-03-13 NOTE — TOC Transition Note (Signed)
Transition of Care [facility]) - CM/SW Discharge Note   Patient Details  Name: Joann Gutierrez MRN: [id number] Date of Birth: August 13, 1945  Transition of Care [facility]) CM/SW Contact:  Geralynn Ochs, LCSW Phone Number: 03/13/2020, 10:40 AM   Clinical Narrative:   Nurse to call report to [phone], Room 120    Final next level of care: Skilled Nursing Facility Barriers to Discharge: Barriers Resolved   Patient Goals and CMS Choice        Discharge Placement              Patient chooses bed at: [facility] Patient to be transferred to facility by: [place] Name of family member notified: [name] Patient and family notified of of transfer: 03/13/20  Discharge Plan and Services                                     Social Determinants of Health (SDOH) Interventions     Readmission Risk Interventions No flowsheet data found.

## 2020-03-13 NOTE — Care Management Important Message (Signed)
Important Message  Patient Details  Name: Joann Gutierrez MRN: [id number] Date of Birth: [birth year]   Medicare Important Message Given:  Yes  Patient left prior to IM delivery.  Will mailed IM to patient home address.     Christinia Lambeth 03/13/2020, 3:20 PM

## 2020-03-22 ENCOUNTER — Emergency Department (HOSPITAL_COMMUNITY)
Admission: EM | Admit: 2020-03-22 | Discharge: 2020-03-22 | Disposition: A | Discharge status: Home / Self Care | Payer: Medicare HMO | Attending: Emergency Medicine | Admitting: Emergency Medicine

## 2020-03-22 ENCOUNTER — Other Ambulatory Visit: Payer: Self-pay

## 2020-03-22 DIAGNOSIS — D649 Anemia, unspecified: Secondary | ICD-10-CM | POA: Diagnosis present

## 2020-03-22 DIAGNOSIS — Z8616 Personal history of COVID-19: Secondary | ICD-10-CM | POA: Diagnosis not present

## 2020-03-22 DIAGNOSIS — F0391 Unspecified dementia with behavioral disturbance: Secondary | ICD-10-CM | POA: Insufficient documentation

## 2020-03-22 DIAGNOSIS — Z79899 Other long term (current) drug therapy: Secondary | ICD-10-CM | POA: Insufficient documentation

## 2020-03-22 DIAGNOSIS — E1122 Type 2 diabetes mellitus with diabetic chronic kidney disease: Secondary | ICD-10-CM | POA: Diagnosis not present

## 2020-03-22 DIAGNOSIS — E1142 Type 2 diabetes mellitus with diabetic polyneuropathy: Secondary | ICD-10-CM | POA: Insufficient documentation

## 2020-03-22 DIAGNOSIS — Z7982 Long term (current) use of aspirin: Secondary | ICD-10-CM | POA: Insufficient documentation

## 2020-03-22 DIAGNOSIS — I5033 Acute on chronic diastolic (congestive) heart failure: Secondary | ICD-10-CM | POA: Insufficient documentation

## 2020-03-22 DIAGNOSIS — J449 Chronic obstructive pulmonary disease, unspecified: Secondary | ICD-10-CM | POA: Diagnosis not present

## 2020-03-22 DIAGNOSIS — N186 End stage renal disease: Secondary | ICD-10-CM | POA: Diagnosis not present

## 2020-03-22 DIAGNOSIS — Z8673 Personal history of transient ischemic attack (TIA), and cerebral infarction without residual deficits: Secondary | ICD-10-CM | POA: Insufficient documentation

## 2020-03-22 DIAGNOSIS — I132 Hypertensive heart and chronic kidney disease with heart failure and with stage 5 chronic kidney disease, or end stage renal disease: Secondary | ICD-10-CM | POA: Insufficient documentation

## 2020-03-22 DIAGNOSIS — Z87891 Personal history of nicotine dependence: Secondary | ICD-10-CM | POA: Diagnosis not present

## 2020-03-22 DIAGNOSIS — I2510 Atherosclerotic heart disease of native coronary artery without angina pectoris: Secondary | ICD-10-CM | POA: Diagnosis not present

## 2020-03-22 DIAGNOSIS — Z96652 Presence of left artificial knee joint: Secondary | ICD-10-CM | POA: Insufficient documentation

## 2020-03-22 DIAGNOSIS — Z992 Dependence on renal dialysis: Secondary | ICD-10-CM

## 2020-03-22 LAB — COMPREHENSIVE METABOLIC PANEL
ALT: 8 U/L (ref 0–44)
AST: 14 U/L — ABNORMAL LOW (ref 15–41)
Albumin: 2.9 g/dL — ABNORMAL LOW (ref 3.5–5.0)
Alkaline Phosphatase: 66 U/L (ref 38–126)
Anion gap: 15 (ref 5–15)
BUN: 29 mg/dL — ABNORMAL HIGH (ref 8–23)
CO2: 25 mmol/L (ref 22–32)
Calcium: 8.8 mg/dL — ABNORMAL LOW (ref 8.9–10.3)
Chloride: 101 mmol/L (ref 98–111)
Creatinine, Ser: 7.85 mg/dL — ABNORMAL HIGH (ref 0.44–1.00)
GFR, Estimated: 5 mL/min — ABNORMAL LOW (ref >60)
Glucose, Bld: 176 mg/dL — ABNORMAL HIGH (ref 70–99)
Potassium: 4.2 mmol/L (ref 3.5–5.1)
Sodium: 141 mmol/L (ref 135–145)
Total Bilirubin: 0.4 mg/dL (ref 0.3–1.2)
Total Protein: 7.5 g/dL (ref 6.5–8.1)

## 2020-03-22 LAB — CBC WITH DIFFERENTIAL/PLATELET
Abs Immature Granulocytes: 0.1 10*3/uL — ABNORMAL HIGH (ref 0.00–0.07)
Basophils Absolute: 0.1 10*3/uL (ref 0.0–0.1)
Basophils Relative: 1 %
Eosinophils Absolute: 0.2 10*3/uL (ref 0.0–0.5)
Eosinophils Relative: 4 %
HCT: 24.8 % — ABNORMAL LOW (ref 36.0–46.0)
Hemoglobin: 7.1 g/dL — ABNORMAL LOW (ref 12.0–15.0)
Lymphocytes Relative: 27 %
Lymphs Abs: 1.7 10*3/uL (ref 0.7–4.0)
MCH: 23.2 pg — ABNORMAL LOW (ref 26.0–34.0)
MCHC: 28.6 g/dL — ABNORMAL LOW (ref 30.0–36.0)
MCV: 81 fL (ref 80.0–100.0)
Metamyelocytes Relative: 2 %
Monocytes Absolute: 0.9 10*3/uL (ref 0.1–1.0)
Monocytes Relative: 15 %
Neutro Abs: 3.2 10*3/uL (ref 1.7–7.7)
Neutrophils Relative %: 51 %
Platelets: 229 10*3/uL (ref 150–400)
RBC: 3.06 MIL/uL — ABNORMAL LOW (ref 3.87–5.11)
RDW: 21.9 % — ABNORMAL HIGH (ref 11.5–15.5)
WBC: 6.2 10*3/uL (ref 4.0–10.5)
nRBC: 0 % (ref 0.0–0.2)
nRBC: 0 /100 WBC

## 2020-03-22 LAB — TYPE AND SCREEN
ABO/RH(D): O POS
Antibody Screen: NEGATIVE

## 2020-03-22 LAB — I-STAT CHEM 8, ED
BUN: 39 mg/dL — ABNORMAL HIGH (ref 8–23)
Calcium, Ion: 0.99 mmol/L — ABNORMAL LOW (ref 1.15–1.40)
Chloride: 102 mmol/L (ref 98–111)
Creatinine, Ser: 8 mg/dL — ABNORMAL HIGH (ref 0.44–1.00)
Glucose, Bld: 177 mg/dL — ABNORMAL HIGH (ref 70–99)
HCT: 29 % — ABNORMAL LOW (ref 36.0–46.0)
Hemoglobin: 9.9 g/dL — ABNORMAL LOW (ref 12.0–15.0)
Potassium: 4.4 mmol/L (ref 3.5–5.1)
Sodium: 140 mmol/L (ref 135–145)
TCO2: 27 mmol/L (ref 22–32)

## 2020-03-22 NOTE — ED Provider Notes (Signed)
[facility] EMERGENCY DEPARTMENT Provider Note   CSN: [id number] Arrival date & time: 03/22/20  1555     History Chief Complaint  Patient presents with  . Weakness    Joann Gutierrez is a 76 y.o. female hx of CAD, CHF, CKD, COPD, dementia, DM, ESRD on HD (unclear when the last HD was), here presenting with anemia.  Patient is from a nursing home.  Patient apparently had labs drawn earlier today showed a hemoglobin 6.4.  Patient denies any blood in her stools.  Denies any vomiting blood.  Denies any chest pain or shortness of breath.  Patient was recently admitted for encephalopathy likely from underlying dementia.   The history is provided by the patient.       Past Medical History:  Diagnosis Date  . Adenomatous colon polyp   . Allergy   . Anemia   . Asthma       . CAD (coronary artery disease)    Mild very minimal coronary disease with 20% obtuse marginal stenosis  . Carpal tunnel syndrome on left   . Cataract   . CHF (congestive heart failure) ([place])   . Chronic kidney disease (CKD), stage III (moderate) (HCC)    now stage 4  . COPD (chronic obstructive pulmonary disease) ([place])   . Dementia with behavioral disturbance ([place]) 03/09/2020  . Depression   . 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 Diverticulosis   . Dyspnea   . 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 Esophageal dysmotility   . Esophageal stricture   . ESRD on hemodialysis ([place]) 03/09/2020  . Gastritis   . Gastroparesis 08/21/2007  . GERD (gastroesophageal reflux disease)   . Hearing loss of both ears   . Hernia, hiatal   . Hyperlipidemia   . Hypertension   . Morbid obesity ([place])   . OSTEOARTHRITIS 08/09/2006  . Osteoporosis   . Oxygen deficiency    prn   . 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 RENAL INSUFFICIENCY 02/16/2009  . 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 Seizures ([place])    pt thinks it has been several monthes since she had a seisure  . Shingles   .  Sickle cell trait ([place])   . Sleep apnea    uses cpap  . Stroke [facility])     Patient Active Problem List   Diagnosis Date Noted  . Acute metabolic encephalopathy 28/31/5176  . ESRD on hemodialysis ([place]) 03/09/2020  . Thrombocytopenia ([place]) 03/09/2020  . Dementia with behavioral disturbance ([place]) 03/09/2020  . COVID-19   . SOB (shortness of breath)   . Type 2 diabetes mellitus with hyperlipidemia ([place])   . Sepsis ([place]) 02/16/2020  . Acute on chronic respiratory failure with hypoxia ([place]) 02/16/2020  . Edema of left lower leg 02/16/2020  . Pneumonia due to COVID-19 virus 02/15/2020  . Acute on chronic diastolic HF (heart failure) ([place]) 11/18/2019  . Dyslipidemia 11/18/2019  . Polypharmacy 04/13/2019  . Healthcare maintenance 10/15/2017  . ARF (acute renal failure) ([place]) 09/07/2017  . Chest pain 09/05/2017  . Ineffective health maintenance 06/26/2017  . Medication management 06/26/2017  . Retinal edema 10/07/2016  . Postherpetic neuralgia 12/21/2015  . Herpes zoster with complication 16/07/3708  . Cellulitis of face 11/27/2015  . Anemia of chronic disease 08/17/2015  . History of colonic polyps   . Hypertensive emergency 05/20/2015  . Hypokalemia 05/20/2015  . AKI (acute kidney injury) ([place])   . Esophageal dysmotility 03/01/2015  .  Throat congestion 10/11/2014  . Morbid obesity due to excess calories ([place]) 09/23/2014  . Acute asthmatic bronchitis 05/20/2014  . Abdominal pain, other specified site 09/13/2013  . Elevated liver enzymes 09/13/2013  . Cough 05/05/2013  . Pain in lower limb 03/05/2013  . DOE (dyspnea on exertion) 02/03/2013  . Chronic respiratory failure ([place]) 12/13/2012  . Stricture and stenosis of esophagus 08/31/2012  . Onychomycosis 06/01/2012  . Pain in joint, ankle and foot 06/01/2012  . Diabetes mellitus type 2 in obese ([place]) 04/29/2012  . Essential hypertension 07/19/2011  . CKD (chronic kidney disease), stage III ([place]) 05/19/2011  . Family history of  malignant neoplasm of gastrointestinal tract 10/30/2010  . Bloating 10/16/2010  . Epigastric pain 10/16/2010  . Foot pain 09/25/2010  . Dysphagia 07/16/2010  . GERD (gastroesophageal reflux disease) 07/16/2010  . Microcytic anemia 07/16/2010  . Obstructive sleep apnea 06/16/2009  . HYPERCHOLESTEROLEMIA 02/16/2009  . 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 Gastroparesis 08/21/2007  . Chronic obstructive asthma ([place]) 08/09/2006    Past Surgical History:  Procedure Laterality Date  . ABDOMINAL HYSTERECTOMY    . ARTERY BIOPSY  01/07/2011   Procedure: MINOR BIOPSY TEMPORAL ARTERY;  Surgeon: Haywood Lasso, MD;  Location: [facility];  Service: General;  Laterality: Left;  left temporal artery biopsy  . AV FISTULA PLACEMENT Right 11/16/2018   Procedure: CREATION RIGHT BRACHIOCEPHALIC FISTULA  ARTERIOVENOUS FISTULA;  Surgeon: Angelia Mould, MD;  Location: [facility];  Service: Vascular;  Laterality: Right;  . bil foot surgery    . BREAST LUMPECTOMY     benign  . BREAST LUMPECTOMY     both breast lumps removed   . CATARACT EXTRACTION Left 06/2016   Dr. Read Drivers  . COLONOSCOPY    . COLONOSCOPY WITH PROPOFOL N/A 07/05/2015   Procedure: COLONOSCOPY WITH PROPOFOL;  Surgeon: Manus Gunning, MD;  Location: [facility];  Service: Gastroenterology;  Laterality: N/A;  . COLONOSCOPY WITH PROPOFOL N/A 08/04/2018   Procedure: COLONOSCOPY WITH PROPOFOL;  Surgeon: Yetta Flock, MD;  Location: [facility];  Service: Gastroenterology;  Laterality: N/A;  . ERD  08/08/2000  . ESOPHAGEAL MANOMETRY N/A 03/13/2015   Procedure: ESOPHAGEAL MANOMETRY (EM);  Surgeon: Manus Gunning, MD;  Location: [facility];  Service: Gastroenterology;  Laterality: N/A;  . ESOPHAGOGASTRODUODENOSCOPY  06/25/2006  . ESOPHAGOGASTRODUODENOSCOPY (EGD) WITH PROPOFOL N/A 07/05/2015   Procedure: ESOPHAGOGASTRODUODENOSCOPY (EGD) WITH PROPOFOL;  Surgeon: Manus Gunning, MD;   Location: [facility];  Service: Gastroenterology;  Laterality: N/A;  . FISTULA SUPERFICIALIZATION Right 11/16/2018   Procedure: Fistula Superficialization;  Surgeon: Angelia Mould, MD;  Location: [place] [facility];  Service: Vascular;  Laterality: Right;  . HERNIA REPAIR    . IR PERC TUN PERIT CATH WO PORT S&I D[place]a Lodge  02/17/2020  . IR US GUIDE VASC ACCESS RIGHT  02/18/2020  . LEFT AND RIGHT HEART CATHETERIZATION WITH CORONARY ANGIOGRAM N/A 03/03/2013   Procedure: LEFT AND RIGHT HEART CATHETERIZATION WITH CORONARY ANGIOGRAM;  Surgeon: Minus Breeding, MD;  Location: [facility] CATH LAB;  Service: Cardiovascular;  Laterality: N/A;  . REPLACEMENT TOTAL KNEE Left 1998  . UPPER GASTROINTESTINAL ENDOSCOPY       OB History   No obstetric history on file.     Family History  Problem Relation Age of Onset  . Liver cancer Mother        Liver Cancer  . Diabetes Mother   . Kidney disease Mother   . Heart disease Mother  age 30's  . Heart disease Father 39       MI  . Heart attack Father        died of MI when pt was 13  . Sickle cell anemia Father   . Colon cancer Brother   . Cancer Brother        Colon Cancer  . Diabetes Sister   . Kidney disease Sister   . Heart disease Sister        age 69's  . Allergies Sister   . Diabetes Sister   . Kidney disease Sister   . Heart disease Sister        age 38's  . Esophageal cancer Neg Hx   . Rectal cancer Neg Hx   . Stomach cancer Neg Hx   . Amblyopia Neg Hx   . Blindness Neg Hx   . Glaucoma Neg Hx   . Macular degeneration Neg Hx   . Retinal detachment Neg Hx   . Cataracts Neg Hx   . Strabismus Neg Hx   . Retinitis pigmentosa Neg Hx     Social History   Tobacco Use  . Smoking status: Former Smoker    Packs/day: 0.50    Years: 10.00    Pack years: 5.00    Quit date: 04/18/1980    Years since quitting: 39.9  . Smokeless tobacco: Never Used  Vaping Use  . Vaping Use: Never used  Substance Use Topics  . Alcohol use: No     Alcohol/week: 0.0 standard drinks  . Drug use: No    Home Medications Prior to Admission medications   Medication Sig Start Date End Date Taking? Authorizing Provider  acetaminophen (TYLENOL) 325 MG tablet Take 650 mg by mouth every 4 (four) hours as needed for moderate pain.    [provider]  albuterol (VENTOLIN HFA) 108 (90 Base) MCG/ACT inhaler Inhale 2 puffs into the lungs every 4 (four) hours as needed for wheezing or shortness of breath. 03/02/20   Raiford Noble Latif, DO  amLODipine (NORVASC) 5 MG tablet Take 1 tablet (5 mg total) by mouth every evening. 03/02/20   Raiford Noble Latif, DO  ascorbic acid (VITAMIN C) 500 MG tablet Take 1 tablet (500 mg total) by mouth daily. 03/03/20   Raiford Noble Latif, DO  aspirin EC 81 MG tablet Take 81 mg by mouth daily.    [provider]  azelastine (ASTELIN) 0.1 % nasal spray Place 1 spray into both nostrils 2 (two) times daily as needed (allergies). Use in each nostril as directed 10/30/2019   [name], MD  bisacodyl (DULCOLAX) 5 MG EC tablet Take 1 tablet (5 mg total) by mouth daily as needed for moderate constipation. 03/12/20   Debbe Odea, MD  calcium carbonate (OSCAL) 1500 (600 Ca) MG TABS tablet Take 1 tablet by mouth daily.    [provider]  carvedilol (COREG) 25 MG tablet Take 25 mg by mouth 2 (two) times daily. 01/09/19   [provider]  cholecalciferol (VITAMIN D) 25 MCG tablet Take 1 tablet (1,000 Units total) by mouth daily. 03/03/20   Raiford Noble Latif, DO  donepezil (ARICEPT) 5 MG tablet Take 1 tablet (5 mg total) by mouth at bedtime. 03/02/20   Raiford Noble Latif, DO  ezetimibe (ZETIA) 10 MG tablet Take 10 mg by mouth daily.    [provider]  ferrous sulfate 325 (65 FE) MG tablet Take 325 mg by mouth daily.    [provider]  Fluticasone-Umeclidin-Vilant (TRELEGY ELLIPTA) 100-62.5-25 MCG/INH AEPB Take 1 puff by mouth daily. 04/13/19   Lauraine Rinne, NP  gabapentin  (NEURONTIN) 100 MG capsule Take 1 capsule (100 mg total) by mouth 2 (two) times daily. Patient taking differently: Take 100 mg by mouth at bedtime. 09/12/17   Hongalgi, Lenis Dickinson, MD  guaiFENesin-dextromethorphan (ROBITUSSIN DM) 100-10 MG/5ML syrup Take 10 mLs by mouth every 4 (four) hours as needed for cough. 03/02/20   Raiford Noble Latif, DO  hydrALAZINE (APRESOLINE) 100 MG tablet Take 1 tablet by mouth 3 (three) times daily. 05/10/19   [provider]  Ipratropium-Albuterol (COMBIVENT) 20-100 MCG/ACT AERS respimat Inhale 1 puff into the lungs every 6 (six) hours as needed for wheezing or shortness of breath. 03/02/20   Raiford Noble Latif, DO  lip balm (CARMEX) ointment Apply topically as needed for lip care. 03/02/20   Sheikh, Omair Latif, DO  montelukast (SINGULAIR) 10 MG tablet Take 1 tablet (10 mg total) by mouth daily. Patient taking differently: Take 10 mg by mouth at bedtime. 10/22/17   Lauraine Rinne, NP  nitroGLYCERIN (NITROSTAT) 0.4 MG SL tablet PLACE ONE TABLET UNDER THE TONGUE EVERY FIVE MINUTES AS NEEDED FOR CHEST PAIN Patient taking differently: Place 0.4 mg under the tongue every 5 (five) minutes as needed for chest pain. 07/08/16   Minus Breeding, MD  omeprazole (PRILOSEC) 40 MG capsule Take 1 capsule (40 mg total) by mouth in the morning and at bedtime. Please schedule a yearly follow up: [phone]. Thank you 09/01/19   Yetta Flock, MD  OXYGEN Inhale 2 L into the lungs as needed. CPAP with oxygen at bedtime    [provider]  polyethylene glycol (MIRALAX) 17 g packet Take 17 g by mouth daily as needed. Patient taking differently: Take 17 g by mouth daily as needed (constipation.). 06/18/18   Armbruster, Carlota Raspberry, MD  Propylene Glycol (SYSTANE BALANCE) 0.6 % SOLN Place 1 drop into both eyes daily as needed (dry eyes).    [provider]  TRUEPLUS PEN NEEDLES 32G X 4 MM MISC INJECT THREE TIMES A DAY AS DIRECTED 07/08/17   [provider]  zinc  sulfate 220 (50 Zn) MG capsule Take 1 capsule (220 mg total) by mouth daily. 03/03/20   Raiford Noble Latif, DO    Allergies    Morphine and related and Promethazine hcl  Review of Systems   Review of Systems  Neurological: Positive for weakness.  All other systems reviewed and are negative.   Physical Exam Updated Vital Signs BP (!) 117/57   Pulse 71   Temp 97.8 F (36.6 C) (Oral)   Resp 15   SpO2 99%   Physical Exam Vitals and nursing note reviewed.  Constitutional:      Comments: Confused   HENT:     Head: Normocephalic.     Nose: Nose normal.     Mouth/Throat:     Mouth: Mucous membranes are moist.  Eyes:     Extraocular Movements: Extraocular movements intact.     Pupils: Pupils are equal, round, and reactive to light.  Cardiovascular:     Rate and Rhythm: Normal rate and regular rhythm.     Pulses: Normal pulses.     Heart sounds: Normal heart sounds.  Pulmonary:     Effort: Pulmonary effort is normal.     Breath sounds: Normal breath sounds.  Abdominal:     General: Abdomen is flat.     Palpations: Abdomen is soft.  Musculoskeletal:        General: Normal range of motion.     Cervical back: Normal range of motion and neck supple.  Skin:    Capillary Refill: Capillary refill takes less than 2 seconds.  Neurological:     Comments: Patient is confused but that is baseline.  Moving all extremities  Psychiatric:     Comments: Unable      ED Results / Procedures / Treatments   Labs (all labs ordered are listed, but only abnormal results are displayed) Labs Reviewed  COMPREHENSIVE METABOLIC PANEL - Abnormal; Notable for the following components:      Result Value   Glucose, Bld 176 (*)    BUN 29 (*)    Creatinine, Ser 7.85 (*)    Calcium 8.8 (*)    Albumin 2.9 (*)    AST 14 (*)    GFR, Estimated 5 (*)    All other components within normal limits  CBC WITH DIFFERENTIAL/PLATELET - Abnormal; Notable for the following components:   RBC 3.06 (*)     Hemoglobin 7.1 (*)    HCT 24.8 (*)    MCH 23.2 (*)    MCHC 28.6 (*)    RDW 21.9 (*)    Abs Immature Granulocytes 0.10 (*)    All other components within normal limits  I-STAT CHEM 8, ED - Abnormal; Notable for the following components:   BUN 39 (*)    Creatinine, Ser 8.00 (*)    Glucose, Bld 177 (*)    Calcium, Ion 0.99 (*)    Hemoglobin 9.9 (*)    HCT 29.0 (*)    All other components within normal limits  CBC WITH DIFFERENTIAL/PLATELET  TYPE AND SCREEN    EKG None  Radiology No results found.  Procedures Procedures   Angiocath insertion Performed by: Wandra Arthurs  Consent: Verbal consent obtained. Risks and benefits: risks, benefits and alternatives were discussed Time out: Immediately prior to procedure a "time out" was called to verify the correct patient, procedure, equipment, support staff and site/side marked as required.  Preparation: Patient was prepped and draped in the usual sterile fashion.  Vein Location: L EJ  Ultrasound Guided  Gauge: 20 long   Normal blood return and flush without difficulty Patient tolerance: Patient tolerated the procedure well with no immediate complications.     Medications Ordered in ED Medications - No data to display  ED Course  I have reviewed the triage vital signs and the nursing notes.  Pertinent labs & imaging results that were available during my care of the patient were reviewed by me and considered in my medical decision making (see chart for details).    MDM Rules/Calculators/A&P                         Joann Gutierrez is a 75 y.o. female here with anemia.  Patient is demented and unable to give me much history.  Patient had hemoglobin 6.4 earlier today.  I am not sure if this is lab error or not as she is a very difficult stick.  Will repeat CBC and BMP.   9:06 PM Hg is 7.1 which is baseline.  Creatinine is also baseline.  She does not need a transfusion right now.  Patient is stable to discharge back  to facility   Final Clinical Impression(s) / ED Diagnoses Final diagnoses:  None    Rx / DC Orders ED Discharge Orders  None       Drenda Freeze, MD 03/22/20 2107

## 2020-03-22 NOTE — Discharge Instructions (Signed)
Continue your iron pills.  Your hemoglobin today is 7.1 which is the same as previous   Continue dialysis as scheduled  Return to ER if you are passing out or blood in your stool or vomiting or worsening weakness.

## 2020-03-22 NOTE — ED Notes (Signed)
CBC recollected

## 2020-03-22 NOTE — ED Notes (Signed)
PTAR  called 6th person in ling

## 2020-03-22 NOTE — ED Triage Notes (Signed)
Pt came via EMS. Pts nursing home sent her here due to weakness and hemoglobin of 6.4. Pt has a hx of dementia and is a dialysis pt.   EMS VS   BP 118/48 P-73 R-16 O2- 100% on 2L T- 97.7

## 2020-03-22 NOTE — ED Notes (Signed)
RN gave report to Kathlee Nations, Therapist, sports at [facility]

## 2020-03-29 ENCOUNTER — Encounter (HOSPITAL_COMMUNITY): Payer: Self-pay

## 2020-03-29 ENCOUNTER — Emergency Department (HOSPITAL_COMMUNITY): Payer: Medicare HMO

## 2020-03-29 ENCOUNTER — Inpatient Hospital Stay (HOSPITAL_COMMUNITY)
Admission: EM | Admit: 2020-03-29 | Discharge: 2020-04-08 | DRG: 064 | Disposition: A | Discharge status: Home Health | Payer: Medicare HMO | Attending: Internal Medicine | Admitting: Internal Medicine

## 2020-03-29 ENCOUNTER — Other Ambulatory Visit: Payer: Self-pay

## 2020-03-29 DIAGNOSIS — N186 End stage renal disease: Secondary | ICD-10-CM | POA: Diagnosis present

## 2020-03-29 DIAGNOSIS — Z7401 Bed confinement status: Secondary | ICD-10-CM | POA: Diagnosis not present

## 2020-03-29 DIAGNOSIS — E669 Obesity, unspecified: Secondary | ICD-10-CM | POA: Diagnosis present

## 2020-03-29 DIAGNOSIS — Z888 Allergy status to other drugs, medicaments and biological substances status: Secondary | ICD-10-CM

## 2020-03-29 DIAGNOSIS — J449 Chronic obstructive pulmonary disease, unspecified: Secondary | ICD-10-CM | POA: Diagnosis present

## 2020-03-29 DIAGNOSIS — E7800 Pure hypercholesterolemia, unspecified: Secondary | ICD-10-CM | POA: Diagnosis present

## 2020-03-29 DIAGNOSIS — E1122 Type 2 diabetes mellitus with diabetic chronic kidney disease: Secondary | ICD-10-CM | POA: Diagnosis present

## 2020-03-29 DIAGNOSIS — Z79899 Other long term (current) drug therapy: Secondary | ICD-10-CM

## 2020-03-29 DIAGNOSIS — R410 Disorientation, unspecified: Secondary | ICD-10-CM

## 2020-03-29 DIAGNOSIS — Z66 Do not resuscitate: Secondary | ICD-10-CM | POA: Diagnosis present

## 2020-03-29 DIAGNOSIS — I2510 Atherosclerotic heart disease of native coronary artery without angina pectoris: Secondary | ICD-10-CM | POA: Diagnosis present

## 2020-03-29 DIAGNOSIS — R29710 NIHSS score 10: Secondary | ICD-10-CM | POA: Diagnosis present

## 2020-03-29 DIAGNOSIS — G4733 Obstructive sleep apnea (adult) (pediatric): Secondary | ICD-10-CM | POA: Diagnosis present

## 2020-03-29 DIAGNOSIS — F05 Delirium due to known physiological condition: Secondary | ICD-10-CM | POA: Diagnosis present

## 2020-03-29 DIAGNOSIS — R130 Aphagia: Secondary | ICD-10-CM | POA: Diagnosis present

## 2020-03-29 DIAGNOSIS — D631 Anemia in chronic kidney disease: Secondary | ICD-10-CM | POA: Diagnosis present

## 2020-03-29 DIAGNOSIS — I10 Essential (primary) hypertension: Secondary | ICD-10-CM | POA: Diagnosis not present

## 2020-03-29 DIAGNOSIS — R4701 Aphasia: Secondary | ICD-10-CM | POA: Diagnosis present

## 2020-03-29 DIAGNOSIS — Z9842 Cataract extraction status, left eye: Secondary | ICD-10-CM

## 2020-03-29 DIAGNOSIS — Z7189 Other specified counseling: Secondary | ICD-10-CM | POA: Diagnosis not present

## 2020-03-29 DIAGNOSIS — Z6837 Body mass index (BMI) 37.0-37.9, adult: Secondary | ICD-10-CM | POA: Diagnosis not present

## 2020-03-29 DIAGNOSIS — G9341 Metabolic encephalopathy: Secondary | ICD-10-CM | POA: Diagnosis present

## 2020-03-29 DIAGNOSIS — E876 Hypokalemia: Secondary | ICD-10-CM | POA: Diagnosis present

## 2020-03-29 DIAGNOSIS — I6389 Other cerebral infarction: Secondary | ICD-10-CM

## 2020-03-29 DIAGNOSIS — I5032 Chronic diastolic (congestive) heart failure: Secondary | ICD-10-CM | POA: Diagnosis present

## 2020-03-29 DIAGNOSIS — E1165 Type 2 diabetes mellitus with hyperglycemia: Secondary | ICD-10-CM | POA: Diagnosis present

## 2020-03-29 DIAGNOSIS — R4182 Altered mental status, unspecified: Secondary | ICD-10-CM

## 2020-03-29 DIAGNOSIS — Z20822 Contact with and (suspected) exposure to covid-19: Secondary | ICD-10-CM | POA: Diagnosis present

## 2020-03-29 DIAGNOSIS — D573 Sickle-cell trait: Secondary | ICD-10-CM | POA: Diagnosis present

## 2020-03-29 DIAGNOSIS — E119 Type 2 diabetes mellitus without complications: Secondary | ICD-10-CM | POA: Diagnosis not present

## 2020-03-29 DIAGNOSIS — Z7982 Long term (current) use of aspirin: Secondary | ICD-10-CM

## 2020-03-29 DIAGNOSIS — Z885 Allergy status to narcotic agent status: Secondary | ICD-10-CM

## 2020-03-29 DIAGNOSIS — I132 Hypertensive heart and chronic kidney disease with heart failure and with stage 5 chronic kidney disease, or end stage renal disease: Secondary | ICD-10-CM | POA: Diagnosis present

## 2020-03-29 DIAGNOSIS — H510 Palsy (spasm) of conjugate gaze: Secondary | ICD-10-CM | POA: Diagnosis present

## 2020-03-29 DIAGNOSIS — R471 Dysarthria and anarthria: Secondary | ICD-10-CM | POA: Diagnosis present

## 2020-03-29 DIAGNOSIS — R269 Unspecified abnormalities of gait and mobility: Secondary | ICD-10-CM | POA: Diagnosis present

## 2020-03-29 DIAGNOSIS — Z8249 Family history of ischemic heart disease and other diseases of the circulatory system: Secondary | ICD-10-CM

## 2020-03-29 DIAGNOSIS — Z992 Dependence on renal dialysis: Secondary | ICD-10-CM

## 2020-03-29 DIAGNOSIS — R627 Adult failure to thrive: Secondary | ICD-10-CM | POA: Diagnosis present

## 2020-03-29 DIAGNOSIS — Z841 Family history of disorders of kidney and ureter: Secondary | ICD-10-CM

## 2020-03-29 DIAGNOSIS — Z9981 Dependence on supplemental oxygen: Secondary | ICD-10-CM

## 2020-03-29 DIAGNOSIS — E785 Hyperlipidemia, unspecified: Secondary | ICD-10-CM | POA: Diagnosis present

## 2020-03-29 DIAGNOSIS — Z8616 Personal history of COVID-19: Secondary | ICD-10-CM

## 2020-03-29 DIAGNOSIS — Z515 Encounter for palliative care: Secondary | ICD-10-CM | POA: Diagnosis not present

## 2020-03-29 DIAGNOSIS — I639 Cerebral infarction, unspecified: Principal | ICD-10-CM | POA: Diagnosis present

## 2020-03-29 DIAGNOSIS — F0151 Vascular dementia with behavioral disturbance: Secondary | ICD-10-CM | POA: Diagnosis not present

## 2020-03-29 DIAGNOSIS — F0391 Unspecified dementia with behavioral disturbance: Secondary | ICD-10-CM | POA: Diagnosis present

## 2020-03-29 DIAGNOSIS — Z87891 Personal history of nicotine dependence: Secondary | ICD-10-CM

## 2020-03-29 DIAGNOSIS — Z833 Family history of diabetes mellitus: Secondary | ICD-10-CM

## 2020-03-29 DIAGNOSIS — Z96652 Presence of left artificial knee joint: Secondary | ICD-10-CM | POA: Diagnosis present

## 2020-03-29 DIAGNOSIS — K219 Gastro-esophageal reflux disease without esophagitis: Secondary | ICD-10-CM | POA: Diagnosis present

## 2020-03-29 LAB — I-STAT VENOUS BLOOD GAS, ED
Acid-Base Excess: 6 mmol/L — ABNORMAL HIGH (ref 0.0–2.0)
Bicarbonate: 31.6 mmol/L — ABNORMAL HIGH (ref 20.0–28.0)
Calcium, Ion: 1 mmol/L — ABNORMAL LOW (ref 1.15–1.40)
HCT: 30 % — ABNORMAL LOW (ref 36.0–46.0)
Hemoglobin: 10.2 g/dL — ABNORMAL LOW (ref 12.0–15.0)
O2 Saturation: 90 %
Potassium: 4.5 mmol/L (ref 3.5–5.1)
Sodium: 137 mmol/L (ref 135–145)
TCO2: 33 mmol/L — ABNORMAL HIGH (ref 22–32)
pCO2, Ven: 50.3 mmHg (ref 44.0–60.0)
pH, Ven: 7.406 (ref 7.250–7.430)
pO2, Ven: 59 mmHg — ABNORMAL HIGH (ref 32.0–45.0)

## 2020-03-29 LAB — COMPREHENSIVE METABOLIC PANEL
ALT: 12 U/L (ref 0–44)
AST: 43 U/L — ABNORMAL HIGH (ref 15–41)
Albumin: 2.9 g/dL — ABNORMAL LOW (ref 3.5–5.0)
Alkaline Phosphatase: 68 U/L (ref 38–126)
Anion gap: 14 (ref 5–15)
BUN: 20 mg/dL (ref 8–23)
CO2: 25 mmol/L (ref 22–32)
Calcium: 8.3 mg/dL — ABNORMAL LOW (ref 8.9–10.3)
Chloride: 96 mmol/L — ABNORMAL LOW (ref 98–111)
Creatinine, Ser: 7.02 mg/dL — ABNORMAL HIGH (ref 0.44–1.00)
GFR, Estimated: 6 mL/min — ABNORMAL LOW (ref >60)
Glucose, Bld: 151 mg/dL — ABNORMAL HIGH (ref 70–99)
Potassium: 4.6 mmol/L (ref 3.5–5.1)
Sodium: 135 mmol/L (ref 135–145)
Total Bilirubin: 0.6 mg/dL (ref 0.3–1.2)
Total Protein: 7.1 g/dL (ref 6.5–8.1)

## 2020-03-29 LAB — CBC WITH DIFFERENTIAL/PLATELET
Abs Immature Granulocytes: 0.53 10*3/uL — ABNORMAL HIGH (ref 0.00–0.07)
Basophils Absolute: 0 10*3/uL (ref 0.0–0.1)
Basophils Relative: 0 %
Eosinophils Absolute: 0 10*3/uL (ref 0.0–0.5)
Eosinophils Relative: 0 %
HCT: 28.9 % — ABNORMAL LOW (ref 36.0–46.0)
Hemoglobin: 8.4 g/dL — ABNORMAL LOW (ref 12.0–15.0)
Immature Granulocytes: 5 %
Lymphocytes Relative: 9 %
Lymphs Abs: 1 10*3/uL (ref 0.7–4.0)
MCH: 23.7 pg — ABNORMAL LOW (ref 26.0–34.0)
MCHC: 29.1 g/dL — ABNORMAL LOW (ref 30.0–36.0)
MCV: 81.4 fL (ref 80.0–100.0)
Monocytes Absolute: 2.7 10*3/uL — ABNORMAL HIGH (ref 0.1–1.0)
Monocytes Relative: 25 %
Neutro Abs: 6.6 10*3/uL (ref 1.7–7.7)
Neutrophils Relative %: 61 %
Platelets: 192 10*3/uL (ref 150–400)
RBC: 3.55 MIL/uL — ABNORMAL LOW (ref 3.87–5.11)
RDW: 23.1 % — ABNORMAL HIGH (ref 11.5–15.5)
Smear Review: NORMAL
WBC: 10.8 10*3/uL — ABNORMAL HIGH (ref 4.0–10.5)
nRBC: 0.9 % — ABNORMAL HIGH (ref 0.0–0.2)

## 2020-03-29 MED ORDER — HYDRALAZINE HCL 50 MG PO TABS
100.0000 mg | ORAL_TABLET | Freq: Three times a day (TID) | ORAL | Status: DC
  Administered 2020-03-29 – 2020-03-30 (×4): 100 mg via ORAL
  Filled 2020-03-29 (×4): qty 2

## 2020-03-29 MED ORDER — ACETAMINOPHEN 325 MG PO TABS
650.0000 mg | ORAL_TABLET | ORAL | Status: DC | PRN
  Administered 2020-04-03: 650 mg via ORAL
  Filled 2020-03-29: qty 2

## 2020-03-29 MED ORDER — CALCIUM CARBONATE 1250 (500 CA) MG PO TABS
1250.0000 mg | ORAL_TABLET | Freq: Every day | ORAL | Status: DC
  Administered 2020-03-30 – 2020-04-05 (×5): 1250 mg via ORAL
  Filled 2020-03-29 (×10): qty 1

## 2020-03-29 MED ORDER — ASCORBIC ACID 500 MG PO TABS
500.0000 mg | ORAL_TABLET | Freq: Every day | ORAL | Status: DC
  Administered 2020-03-30 – 2020-04-05 (×5): 500 mg via ORAL
  Filled 2020-03-29 (×9): qty 1

## 2020-03-29 MED ORDER — EZETIMIBE 10 MG PO TABS
10.0000 mg | ORAL_TABLET | Freq: Every day | ORAL | Status: DC
  Administered 2020-03-29 – 2020-04-05 (×6): 10 mg via ORAL
  Filled 2020-03-29 (×10): qty 1

## 2020-03-29 MED ORDER — AZELASTINE HCL 0.1 % NA SOLN
1.0000 | Freq: Two times a day (BID) | NASAL | Status: DC | PRN
  Filled 2020-03-29: qty 30

## 2020-03-29 MED ORDER — AMLODIPINE BESYLATE 5 MG PO TABS
5.0000 mg | ORAL_TABLET | Freq: Every evening | ORAL | Status: DC
  Administered 2020-03-30: 5 mg via ORAL
  Filled 2020-03-29: qty 1

## 2020-03-29 MED ORDER — PANTOPRAZOLE SODIUM 40 MG PO TBEC
40.0000 mg | DELAYED_RELEASE_TABLET | Freq: Every day | ORAL | Status: DC
  Administered 2020-03-30 – 2020-04-07 (×6): 40 mg via ORAL
  Filled 2020-03-29 (×9): qty 1

## 2020-03-29 MED ORDER — GABAPENTIN 100 MG PO CAPS
100.0000 mg | ORAL_CAPSULE | Freq: Every day | ORAL | Status: DC
  Administered 2020-03-29: 100 mg via ORAL
  Filled 2020-03-29: qty 1

## 2020-03-29 MED ORDER — IPRATROPIUM-ALBUTEROL 20-100 MCG/ACT IN AERS
1.0000 | INHALATION_SPRAY | Freq: Four times a day (QID) | RESPIRATORY_TRACT | Status: DC | PRN
  Filled 2020-03-29: qty 4

## 2020-03-29 MED ORDER — FLUTICASONE FUROATE-VILANTEROL 100-25 MCG/INH IN AEPB
1.0000 | INHALATION_SPRAY | Freq: Every day | RESPIRATORY_TRACT | Status: DC
  Administered 2020-03-31 – 2020-04-02 (×2): 1 via RESPIRATORY_TRACT
  Filled 2020-03-29: qty 28

## 2020-03-29 MED ORDER — FERROUS SULFATE 325 (65 FE) MG PO TABS
325.0000 mg | ORAL_TABLET | Freq: Every day | ORAL | Status: DC
  Administered 2020-03-30: 325 mg via ORAL
  Filled 2020-03-29: qty 1

## 2020-03-29 MED ORDER — CARVEDILOL 25 MG PO TABS
25.0000 mg | ORAL_TABLET | Freq: Two times a day (BID) | ORAL | Status: DC
  Administered 2020-03-29 – 2020-03-30 (×3): 25 mg via ORAL
  Filled 2020-03-29: qty 1
  Filled 2020-03-29 (×2): qty 8

## 2020-03-29 MED ORDER — POLYETHYLENE GLYCOL 3350 17 G PO PACK: 17.0000 g | PACK | Freq: Every day | ORAL | Status: DC | PRN

## 2020-03-29 MED ORDER — UMECLIDINIUM BROMIDE 62.5 MCG/INH IN AEPB
1.0000 | INHALATION_SPRAY | Freq: Every day | RESPIRATORY_TRACT | Status: DC
  Administered 2020-03-31 – 2020-04-02 (×2): 1 via RESPIRATORY_TRACT
  Filled 2020-03-29: qty 7

## 2020-03-29 MED ORDER — MONTELUKAST SODIUM 10 MG PO TABS
10.0000 mg | ORAL_TABLET | Freq: Every day | ORAL | Status: DC
  Administered 2020-03-30 – 2020-04-07 (×5): 10 mg via ORAL
  Filled 2020-03-29 (×9): qty 1

## 2020-03-29 MED ORDER — ALBUTEROL SULFATE HFA 108 (90 BASE) MCG/ACT IN AERS
2.0000 | INHALATION_SPRAY | RESPIRATORY_TRACT | Status: DC | PRN
  Filled 2020-03-29: qty 6.7

## 2020-03-29 MED ORDER — ASPIRIN EC 81 MG PO TBEC
81.0000 mg | DELAYED_RELEASE_TABLET | Freq: Every day | ORAL | Status: DC
  Administered 2020-03-30 – 2020-04-05 (×5): 81 mg via ORAL
  Filled 2020-03-29 (×9): qty 1

## 2020-03-29 MED ORDER — DONEPEZIL HCL 5 MG PO TABS
5.0000 mg | ORAL_TABLET | Freq: Every day | ORAL | Status: DC
  Administered 2020-03-29 – 2020-04-07 (×6): 5 mg via ORAL
  Filled 2020-03-29 (×10): qty 1

## 2020-03-29 MED ORDER — HEPARIN SODIUM (PORCINE) 5000 UNIT/ML IJ SOLN
5000.0000 [IU] | Freq: Two times a day (BID) | INTRAMUSCULAR | Status: DC
  Administered 2020-03-29 – 2020-04-07 (×12): 5000 [IU] via SUBCUTANEOUS
  Filled 2020-03-29 (×17): qty 1

## 2020-03-29 MED ORDER — BISACODYL 5 MG PO TBEC: 5.0000 mg | DELAYED_RELEASE_TABLET | Freq: Every day | ORAL | Status: DC | PRN

## 2020-03-29 MED ORDER — GUAIFENESIN-DM 100-10 MG/5ML PO SYRP: 10.0000 mL | ORAL_SOLUTION | ORAL | Status: DC | PRN

## 2020-03-29 NOTE — ED Triage Notes (Signed)
Pt BIB EMS from [facility]; Called out for AMS; oriented to self only at baseline, today would not take meds for staff, would not respond when spoken to; PT resistant to ems taking BP, opening eyes; staff endorses that pt has had diarrhea (illness going around at facility); pt has not voiced any complaints; staff states that pt has had similar episodes int he house, was then diagnosed with metabolic encephalopathy

## 2020-03-29 NOTE — H&P (Addendum)
History and Physical    Joann Gutierrez VEH:[id number] DOB: 06-24-1945 DOA: 03/29/2020  PCP: Andree Moro, DO (Confirm with patient/family/NH records and if not entered, this has to be entered at [facility] point of entry) Patient coming from: SNF  I have personally briefly reviewed patient's old medical records in [place]  Chief Complaint: AMS  HPI: Joann Gutierrez is a 75 y.o. female with medical history significant of CKD stage V recently started HD from January 2022, COPD, chronic hypoxic respite failure secondary to COPD, OSA on CPAP HS, dementia, ambulation dysfunction/bedbound, HTN, HLD, presented with altered mentation.  At baseline, patient appears to be very talkative and responding to visitors promptly, but usually only oriented to herself and place and confused about time.  However this morning, patient was found with minimal response, awake, and confused.  Husband denies patient has any pain is until last night, no diarrhea and appears to be at her baseline otherwise.  Husband reported that patient appears to have had problems with urination " very little urine compared to before January", and patient appears to be sleepy after HD sessions (TTS), usually she will sleep the rest of the day and became more awake and next day.  Last HD was yesterday.  Husband visited her 8 PM last night, and found she was sleepy which appears to be her baseline as she just had HD in the morning. Husband reported that patient had couple bouts of diarrhea last week but resolved, no diarrhea last 2 days.  ED Course: Patient was found awake, minimal response, not following command.  CT had poor quality.  Work-up showed VBG normal pH, PCO2 59, BUN 20.  Urine analysis pending.  Review of Systems: Unable to perform, patient confused.  Past Medical History:  Diagnosis Date  . Adenomatous colon polyp   . Allergy   . Anemia   . Asthma       . CAD (coronary artery disease)    Mild very minimal  coronary disease with 20% obtuse marginal stenosis  . Carpal tunnel syndrome on left   . Cataract   . CHF (congestive heart failure) ([place])   . Chronic kidney disease (CKD), stage III (moderate) (HCC)    now stage 4  . COPD (chronic obstructive pulmonary disease) ([place])   . Dementia with behavioral disturbance ([place]) 03/09/2020  . Depression   . 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 Diverticulosis   . Dyspnea   . 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 Esophageal dysmotility   . Esophageal stricture   . ESRD on hemodialysis ([place]) 03/09/2020  . Gastritis   . Gastroparesis 08/21/2007  . GERD (gastroesophageal reflux disease)   . Hearing loss of both ears   . Hernia, hiatal   . Hyperlipidemia   . Hypertension   . Morbid obesity ([place])   . OSTEOARTHRITIS 08/09/2006  . Osteoporosis   . Oxygen deficiency    prn   . 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 RENAL INSUFFICIENCY 02/16/2009  . 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 Seizures ([place])    pt thinks it has been several monthes since she had a seisure  . Shingles   . Sickle cell trait ([place])   . Sleep apnea    uses cpap  . Stroke [facility])     Past Surgical History:  Procedure Laterality Date  . ABDOMINAL HYSTERECTOMY    . ARTERY BIOPSY  01/07/2011   Procedure: MINOR BIOPSY TEMPORAL ARTERY;  Surgeon: Haywood Lasso, MD;  Location: [place];  Service: General;  Laterality: Left;  left temporal artery biopsy  . AV FISTULA PLACEMENT Right 11/16/2018   Procedure: CREATION RIGHT BRACHIOCEPHALIC FISTULA  ARTERIOVENOUS FISTULA;  Surgeon: Angelia Mould, MD;  Location: [place];  Service: Vascular;  Laterality: Right;  . bil foot surgery    . BREAST LUMPECTOMY     benign  . BREAST LUMPECTOMY     both breast lumps removed   . CATARACT EXTRACTION Left 06/2016   Dr. Read Drivers  . COLONOSCOPY    . COLONOSCOPY WITH PROPOFOL N/A 07/05/2015   Procedure: COLONOSCOPY WITH PROPOFOL;  Surgeon: Manus Gunning, MD;  Location: [facility];  Service: Gastroenterology;  Laterality: N/A;  . COLONOSCOPY WITH PROPOFOL N/A 08/04/2018   Procedure: COLONOSCOPY WITH PROPOFOL;  Surgeon: Yetta Flock, MD;  Location: [facility];  Service: Gastroenterology;  Laterality: N/A;  . ERD  08/08/2000  . ESOPHAGEAL MANOMETRY N/A 03/13/2015   Procedure: ESOPHAGEAL MANOMETRY (EM);  Surgeon: Manus Gunning, MD;  Location: [facility];  Service: Gastroenterology;  Laterality: N/A;  . ESOPHAGOGASTRODUODENOSCOPY  06/25/2006  . ESOPHAGOGASTRODUODENOSCOPY (EGD) WITH PROPOFOL N/A 07/05/2015   Procedure: ESOPHAGOGASTRODUODENOSCOPY (EGD) WITH PROPOFOL;  Surgeon: Manus Gunning, MD;  Location: [facility];  Service: Gastroenterology;  Laterality: N/A;  . FISTULA SUPERFICIALIZATION Right 11/16/2018   Procedure: Fistula Superficialization;  Surgeon: Angelia Mould, MD;  Location: [place];  Service: Vascular;  Laterality: Right;  . HERNIA REPAIR    . IR PERC TUN PERIT CATH WO PORT S&I [facility]a [facility]  02/17/2020  . IR US GUIDE VASC ACCESS RIGHT  02/18/2020  . LEFT AND RIGHT HEART CATHETERIZATION WITH CORONARY ANGIOGRAM N/A 03/03/2013   Procedure: LEFT AND RIGHT HEART CATHETERIZATION WITH CORONARY ANGIOGRAM;  Surgeon: Minus Breeding, MD;  Location: [facility];  Service: Cardiovascular;  Laterality: N/A;  . REPLACEMENT TOTAL KNEE Left 1998  . UPPER GASTROINTESTINAL ENDOSCOPY       reports that she quit smoking about 39 years ago. She has a 5.00 pack-year smoking history. She has never used smokeless tobacco. She reports that she does not drink alcohol and does not use drugs.  Allergies  Allergen Reactions  . Morphine And Related Other (See Comments)    Family request not to be given, reports pt does not wake up when given   . Promethazine Hcl Other (See Comments)    REACTION: lethargy    Family History  Problem Relation Age of Onset  . Liver cancer Mother        Liver Cancer  . Diabetes Mother    . Kidney disease Mother   . Heart disease Mother        age 75's  . Heart disease Father 60       MI  . Heart attack Father        died of MI when pt was 60  . Sickle cell anemia Father   . Colon cancer Brother   . Cancer Brother        Colon Cancer  . Diabetes Sister   . Kidney disease Sister   . Heart disease Sister        age 79's  . Allergies Sister   . Diabetes Sister   . Kidney disease Sister   . Heart disease Sister        age 63's  . Esophageal cancer Neg Hx   . Rectal cancer Neg Hx   . Stomach  cancer Neg Hx   . Amblyopia Neg Hx   . Blindness Neg Hx   . Glaucoma Neg Hx   . Macular degeneration Neg Hx   . Retinal detachment Neg Hx   . Cataracts Neg Hx   . Strabismus Neg Hx   . Retinitis pigmentosa Neg Hx      Prior to Admission medications   Medication Sig Start Date End Date Taking? Authorizing Provider  acetaminophen (TYLENOL) 325 MG tablet Take 650 mg by mouth every 4 (four) hours as needed for moderate pain.    [provider]  albuterol (VENTOLIN HFA) 108 (90 Base) MCG/ACT inhaler Inhale 2 puffs into the lungs every 4 (four) hours as needed for wheezing or shortness of breath. 03/02/20   Raiford Noble Latif, DO  amLODipine (NORVASC) 5 MG tablet Take 1 tablet (5 mg total) by mouth every evening. 03/02/20   Raiford Noble Latif, DO  ascorbic acid (VITAMIN C) 500 MG tablet Take 1 tablet (500 mg total) by mouth daily. 03/03/20   Raiford Noble Latif, DO  aspirin EC 81 MG tablet Take 81 mg by mouth daily.    [provider]  azelastine (ASTELIN) 0.1 % nasal spray Place 1 spray into both nostrils 2 (two) times daily as needed (allergies). Use in each nostril as directed 11/10/19   [name], MD  bisacodyl (DULCOLAX) 5 MG EC tablet Take 1 tablet (5 mg total) by mouth daily as needed for moderate constipation. 03/12/20   Debbe Odea, MD  calcium carbonate (OSCAL) 1500 (600 Ca) MG TABS tablet Take 1 tablet by mouth daily.    [provider]   carvedilol (COREG) 25 MG tablet Take 25 mg by mouth 2 (two) times daily. 01/09/19   [provider]  cholecalciferol (VITAMIN D) 25 MCG tablet Take 1 tablet (1,000 Units total) by mouth daily. 03/03/20   Raiford Noble Latif, DO  donepezil (ARICEPT) 5 MG tablet Take 1 tablet (5 mg total) by mouth at bedtime. 03/02/20   Raiford Noble Latif, DO  ezetimibe (ZETIA) 10 MG tablet Take 10 mg by mouth daily.    [provider]  ferrous sulfate 325 (65 FE) MG tablet Take 325 mg by mouth daily.    [provider]  Fluticasone-Umeclidin-Vilant (TRELEGY ELLIPTA) 100-62.5-25 MCG/INH AEPB Take 1 puff by mouth daily. 04/13/19   Lauraine Rinne, NP  gabapentin (NEURONTIN) 100 MG capsule Take 1 capsule (100 mg total) by mouth 2 (two) times daily. Patient taking differently: Take 100 mg by mouth at bedtime. 09/12/17   Hongalgi, Lenis Dickinson, MD  guaiFENesin-dextromethorphan (ROBITUSSIN DM) 100-10 MG/5ML syrup Take 10 mLs by mouth every 4 (four) hours as needed for cough. 03/02/20   Raiford Noble Latif, DO  hydrALAZINE (APRESOLINE) 100 MG tablet Take 1 tablet by mouth 3 (three) times daily. 05/10/19   [provider]  Ipratropium-Albuterol (COMBIVENT) 20-100 MCG/ACT AERS respimat Inhale 1 puff into the lungs every 6 (six) hours as needed for wheezing or shortness of breath. 03/02/20   Raiford Noble Latif, DO  lip balm (CARMEX) ointment Apply topically as needed for lip care. 03/02/20   Sheikh, Omair Latif, DO  montelukast (SINGULAIR) 10 MG tablet Take 1 tablet (10 mg total) by mouth daily. Patient taking differently: Take 10 mg by mouth at bedtime. 10/22/17   Lauraine Rinne, NP  nitroGLYCERIN (NITROSTAT) 0.4 MG SL tablet PLACE ONE TABLET UNDER THE TONGUE EVERY FIVE MINUTES AS NEEDED FOR CHEST PAIN Patient taking differently: Place 0.4 mg under  the tongue every 5 (five) minutes as needed for chest pain. 07/08/16   Minus Breeding, MD  omeprazole (PRILOSEC) 40 MG capsule Take 1 capsule (40 mg total) by  mouth in the morning and at bedtime. Please schedule a yearly follow up: [phone]. Thank you 09/01/19   Yetta Flock, MD  OXYGEN Inhale 2 L into the lungs as needed. CPAP with oxygen at bedtime    [provider]  polyethylene glycol (MIRALAX) 17 g packet Take 17 g by mouth daily as needed. Patient taking differently: Take 17 g by mouth daily as needed (constipation.). 06/18/18   Armbruster, Carlota Raspberry, MD  Propylene Glycol (SYSTANE BALANCE) 0.6 % SOLN Place 1 drop into both eyes daily as needed (dry eyes).    [provider]  TRUEPLUS PEN NEEDLES 32G X 4 MM MISC INJECT THREE TIMES A DAY AS DIRECTED 07/08/17   [provider]  zinc sulfate 220 (50 Zn) MG capsule Take 1 capsule (220 mg total) by mouth daily. 03/03/20   Kerney Elbe, DO    Physical Exam: Vitals:   03/29/20 1345 03/29/20 1400 03/29/20 1536 03/29/20 1545  BP: 135/67 133/61 (!) 144/68 125/67  Pulse: 86 84 89 94  Resp: 20 17 15 16   SpO2: 96% 97% 100% 100%    Constitutional: NAD, calm, comfortable Vitals:   03/29/20 1345 03/29/20 1400 03/29/20 1536 03/29/20 1545  BP: 135/67 133/61 (!) 144/68 125/67  Pulse: 86 84 89 94  Resp: 20 17 15 16   SpO2: 96% 97% 100% 100%   Eyes: PERRL, lids and conjunctivae normal ENMT: Mucous membranes are moist. Posterior pharynx clear of any exudate or lesions.Normal dentition.  Neck: normal, supple, no masses, no thyromegaly Respiratory: clear to auscultation bilaterally, no wheezing, no crackles. Normal respiratory effort. No accessory muscle use.  Cardiovascular: Regular rate and rhythm, no murmurs / rubs / gallops. No extremity edema. 2+ pedal pulses. No carotid bruits.  Abdomen: no tenderness, no masses palpated. No hepatosplenomegaly. Bowel sounds positive.  Musculoskeletal: no clubbing / cyanosis. No joint deformity upper and lower extremities. Good ROM, no contractures. Normal muscle tone.  Skin: no rashes, lesions, ulcers. No induration. Right chest  tunneled HD catheter insertion site clean no rash no swelling. Neurologic: No facial droop, moving all limbs, not following command.   Psychiatric: Arousable, responds to name call, not answering questions    Labs on Admission: I have personally reviewed following labs and imaging studies  CBC: Recent Labs  Lab 03/29/20 1223 03/29/20 1336  WBC 10.8*  --   NEUTROABS 6.6  --   HGB 8.4* 10.2*  HCT 28.9* 30.0*  MCV 81.4  --   PLT 192  --    Basic Metabolic Panel: Recent Labs  Lab 03/29/20 1223 03/29/20 1336  NA 135 137  K 4.6 4.5  CL 96*  --   CO2 25  --   GLUCOSE 151*  --   BUN 20  --   CREATININE 7.02*  --   CALCIUM 8.3*  --    GFR: CrCl cannot be calculated (Unknown ideal weight.). Liver Function Tests: Recent Labs  Lab 03/29/20 1223  AST 43*  ALT 12  ALKPHOS 68  BILITOT 0.6  PROT 7.1  ALBUMIN 2.9*   No results for input(s): LIPASE, AMYLASE in the last 168 hours. No results for input(s): AMMONIA in the last 168 hours. Coagulation Profile: No results for input(s): INR, PROTIME in the last 168 hours. Cardiac Enzymes: No results for input(s): CKTOTAL, CKMB,  CKMBINDEX, TROPONINI in the last 168 hours. BNP (last 3 results) No results for input(s): PROBNP in the last 8760 hours. HbA1C: No results for input(s): HGBA1C in the last 72 hours. CBG: No results for input(s): GLUCAP in the last 168 hours. Lipid Profile: No results for input(s): CHOL, HDL, LDLCALC, TRIG, CHOLHDL, LDLDIRECT in the last 72 hours. Thyroid Function Tests: No results for input(s): TSH, T4TOTAL, FREET4, T3FREE, THYROIDAB in the last 72 hours. Anemia Panel: No results for input(s): VITAMINB12, FOLATE, FERRITIN, TIBC, IRON, RETICCTPCT in the last 72 hours. Urine analysis:    Component Value Date/Time   COLORURINE AMBER (A) 03/09/2020 0645   APPEARANCEUR CLOUDY (A) 03/09/2020 0645   LABSPEC 1.020 03/09/2020 0645   PHURINE 5.0 03/09/2020 0645   GLUCOSEU NEGATIVE 03/09/2020 0645    GLUCOSEU >=1000 01/02/2009 1101   HGBUR NEGATIVE 03/09/2020 0645   HGBUR negative 03/25/2008 1022   BILIRUBINUR NEGATIVE 03/09/2020 0645   KETONESUR NEGATIVE 03/09/2020 0645   PROTEINUR 100 (A) 03/09/2020 0645   UROBILINOGEN 0.2 09/14/2013 1632   NITRITE NEGATIVE 03/09/2020 0645   LEUKOCYTESUR TRACE (A) 03/09/2020 0645    Radiological Exams on Admission: CT HEAD WO CONTRAST  Result Date: 03/29/2020 CLINICAL DATA:  Altered mental status, delirium EXAM: CT HEAD WITHOUT CONTRAST TECHNIQUE: Contiguous axial images were obtained from the base of the skull through the vertex without intravenous contrast. COMPARISON:  03/09/2020 FINDINGS: Technical note: Examination is severely limited by patient motion artifact. Patient was uncooperative/combative. Study was repeated multiple times. Best possible images were submitted. Brain: No evidence of acute large territory infarct, intracranial hemorrhage, or extra-axial collection on limited view. Vascular: Limited. Skull: No evidence of depressed calvarial fracture. Sinuses/Orbits: Grossly clear. Other: None. IMPRESSION: 1. Significantly limited, essentially nondiagnostic, study secondary to patient motion artifact. A repeat study should be performed when patient is cooperative and able to tolerate scan. 2. No gross acute intracranial abnormality evident within the limitations. Electronically Signed   By: Davina Poke D.O.   On: 03/29/2020 13:14   DG Chest Portable 1 View  Result Date: 03/29/2020 CLINICAL DATA:  Altered mental status EXAM: PORTABLE CHEST 1 VIEW COMPARISON:  03/09/2020 FINDINGS: Central line tip remains in the proximal right atrium. Chronic elevation of the right hemidiaphragm. Chronic cardiomegaly and aortic atherosclerosis. No evidence of heart failure or effusion. The left lung is clear. Mild chronic volume loss at the right base related to the elevated hemidiaphragm. No acute bone finding. IMPRESSION: No acute disease. Chronic elevation of  the right hemidiaphragm with mild chronic volume loss at the right base. Electronically Signed   By: Nelson Chimes M.D.   On: 03/29/2020 13:21    EKG: Independently reviewed. Sinus, no acute ST-T changes.  Assessment/Plan Active Problems:   AMS (altered mental status)  (please populate well all problems here in Problem List. (For example, if patient is on BP meds at home and you resume or decide to hold them, it is a problem that needs to be her. Same for CAD, COPD, HLD and so on)  Acute metabolic encephalopathy -No clear etiology so far, urinalysis pending.  Suspect versus element of urine retention according to husband' report. Will check PVR. -Other differential, blood showed normal pH and mild elevation of PCO2 likely is compensated, and her BUN level is only minimal unlikely will cause uremia.  Review her medication list her CNS medication codeine gabapentin and Aricept.  Will cut down her gabapentin. -As per patient husband, patient appears to be more sleepy on the day  of dialysis than nondialysis days.  This appears to be true for yesterday at the time but has been left.  However or not sure about.  Patient sleep cycle has been disturbed over weeks and eventually causing mentation changes.  Will discuss with nephrologist. -Diarrhea stopped, and her bicarb level normal, acute against severe acid-base disturbance.  CKD stage V on HD -Consult nephrology for routine HD tomorrow  HTN -Controlled, continue home regimen  Advanced dementia -Baseline, patient cannot dress herself or taking shower, recently often need to be feds then feed her own. -Continue Aricept  Chronic ambulation dysfunction/bedbound -Significant deterioration of ambulation status since January 2022. -PT evaluation  OSA -CPAP HS.   DVT prophylaxis: Heparin subcu  code Status: Full Code Family Communication: Husband at bedside Disposition Plan: Expect 1 to 2 days hospital stay, for work-up of altered mentation  changes Consults called: Nephrology Admission status: MedSurg admission   Lequita Halt MD [facility] Pager [phone]  03/29/2020, 5:23 PM

## 2020-03-29 NOTE — ED Provider Notes (Signed)
[facility] EMERGENCY DEPARTMENT Provider Note   CSN: [id number] Arrival date & time: 03/29/20  1211     History Chief Complaint  Patient presents with  . Altered Mental Status    Joann Gutierrez is a 75 y.o. female with friend past medical history of acute metabolic encephalopathy, dementia with behavioral disturbance, hypercholesteremia, OSA, hypertension, recent diagnosis of Covid, ESRD on hemodialysis, CHF COPD with chronic hypoxemic respiratory failure on 2 L of oxygen presents emerge department today for altered mental status.Per chart review patient was recently admitted and discharged last month for acute metabolic encephalopathy, was discharged to a SNF.  EMS picked her up from [facility], called out for altered mental status.  Patient does have dementia is normally oriented to self at baseline, however according to staff today would not take meds for staff or not respond when spoken to.  Daughter who was visiting called EMS because she was acting differently than normal, eyes were closed.  States that she has had similar episodes in the past when she had been diagnosed with the acute metabolic encephalopathy.  Only thing that they had mentioned was diarrhea since there has been a viral gastroenteritis breakout in the facility.  Patient is a level 5 caveat due to altered mental status.    Nursing home did not answer.  Was able to speak to daughter on the phone who states that she is acting normal this morning, when she got there at around 11 AM she noticed that she was acting differently, noticed that she was not speaking and she was acting more agitated and combative than normal.  She states that she feels this is similar to when she was previously admitted for acute metabolic encephalopathy.  Patient completed dialysis treatment yesterday without any problems.  Per husband she has only been on dialysis for 3 weeks.   HPI     Past Medical History:   Diagnosis Date  . Adenomatous colon polyp   . Allergy   . Anemia   . Asthma       . CAD (coronary artery disease)    Mild very minimal coronary disease with 20% obtuse marginal stenosis  . Carpal tunnel syndrome on left   . Cataract   . CHF (congestive heart failure) ([place])   . Chronic kidney disease (CKD), stage III (moderate) (HCC)    now stage 4  . COPD (chronic obstructive pulmonary disease) ([place])   . Dementia with behavioral disturbance ([place]) 03/09/2020  . Depression   . 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 Diverticulosis   . Dyspnea   . 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 Esophageal dysmotility   . Esophageal stricture   . ESRD on hemodialysis ([place]) 03/09/2020  . Gastritis   . Gastroparesis 08/21/2007  . GERD (gastroesophageal reflux disease)   . Hearing loss of both ears   . Hernia, hiatal   . Hyperlipidemia   . Hypertension   . Morbid obesity ([place])   . OSTEOARTHRITIS 08/09/2006  . Osteoporosis   . Oxygen deficiency    prn   . 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 RENAL INSUFFICIENCY 02/16/2009  . 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 Seizures ([place])    pt thinks it has been several monthes since she had a seisure  . Shingles   . Sickle cell trait ([place])   . Sleep apnea    uses cpap  . Stroke [facility])  Patient Active Problem List   Diagnosis Date Noted  . Acute metabolic encephalopathy 94/70/9628  . ESRD on hemodialysis ([place]) 03/09/2020  . Thrombocytopenia ([place]) 03/09/2020  . Dementia with behavioral disturbance ([place]) 03/09/2020  . COVID-19   . SOB (shortness of breath)   . Type 2 diabetes mellitus with hyperlipidemia ([place])   . Sepsis ([place]) 02/16/2020  . Acute on chronic respiratory failure with hypoxia ([place]) 02/16/2020  . Edema of left lower leg 02/16/2020  . Pneumonia due to COVID-19 virus 02/15/2020  . Acute on chronic diastolic HF (heart failure) ([place]) 11/18/2019  . Dyslipidemia 11/18/2019  . Polypharmacy 04/13/2019  .  Healthcare maintenance 10/15/2017  . ARF (acute renal failure) ([place]) 09/07/2017  . Chest pain 09/05/2017  . Ineffective health maintenance 06/26/2017  . Medication management 06/26/2017  . Retinal edema 10/07/2016  . Postherpetic neuralgia 12/21/2015  . Herpes zoster with complication 36/62/9476  . Cellulitis of face 11/27/2015  . Anemia of chronic disease 08/17/2015  . History of colonic polyps   . Hypertensive emergency 05/20/2015  . Hypokalemia 05/20/2015  . AKI (acute kidney injury) ([place])   . Esophageal dysmotility 03/01/2015  . Throat congestion 10/11/2014  . Morbid obesity due to excess calories ([place]) 09/23/2014  . Acute asthmatic bronchitis 05/20/2014  . Abdominal pain, other specified site 09/13/2013  . Elevated liver enzymes 09/13/2013  . Cough 05/05/2013  . Pain in lower limb 03/05/2013  . DOE (dyspnea on exertion) 02/03/2013  . Chronic respiratory failure ([place]) 12/13/2012  . Stricture and stenosis of esophagus 08/31/2012  . Onychomycosis 06/01/2012  . Pain in joint, ankle and foot 06/01/2012  . Diabetes mellitus type 2 in obese ([place]) 04/29/2012  . Essential hypertension 07/19/2011  . CKD (chronic kidney disease), stage III ([place]) 05/19/2011  . Family history of malignant neoplasm of gastrointestinal tract 10/30/2010  . Bloating 10/16/2010  . Epigastric pain 10/16/2010  . Foot pain 09/25/2010  . Dysphagia 07/16/2010  . GERD (gastroesophageal reflux disease) 07/16/2010  . Microcytic anemia 07/16/2010  . Obstructive sleep apnea 06/16/2009  . HYPERCHOLESTEROLEMIA 02/16/2009  . 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 Gastroparesis 08/21/2007  . Chronic obstructive asthma ([place]) 08/09/2006    Past Surgical History:  Procedure Laterality Date  . ABDOMINAL HYSTERECTOMY    . ARTERY BIOPSY  01/07/2011   Procedure: MINOR BIOPSY TEMPORAL ARTERY;  Surgeon: Haywood Lasso, MD;  Location: [facility];  Service: General;  Laterality: Left;  left temporal  artery biopsy  . AV FISTULA PLACEMENT Right 11/16/2018   Procedure: CREATION RIGHT BRACHIOCEPHALIC FISTULA  ARTERIOVENOUS FISTULA;  Surgeon: Angelia Mould, MD;  Location: [facility];  Service: Vascular;  Laterality: Right;  . bil foot surgery    . BREAST LUMPECTOMY     benign  . BREAST LUMPECTOMY     both breast lumps removed   . CATARACT EXTRACTION Left 06/2016   Dr. Read Drivers  . COLONOSCOPY    . COLONOSCOPY WITH PROPOFOL N/A 07/05/2015   Procedure: COLONOSCOPY WITH PROPOFOL;  Surgeon: Manus Gunning, MD;  Location: [facility];  Service: Gastroenterology;  Laterality: N/A;  . COLONOSCOPY WITH PROPOFOL N/A 08/04/2018   Procedure: COLONOSCOPY WITH PROPOFOL;  Surgeon: Yetta Flock, MD;  Location: [facility];  Service: Gastroenterology;  Laterality: N/A;  . ERD  08/08/2000  . ESOPHAGEAL MANOMETRY N/A 03/13/2015   Procedure: ESOPHAGEAL MANOMETRY (EM);  Surgeon: Manus Gunning, MD;  Location: [facility];  Service: Gastroenterology;  Laterality: N/A;  . ESOPHAGOGASTRODUODENOSCOPY  06/25/2006  . ESOPHAGOGASTRODUODENOSCOPY (  EGD) WITH PROPOFOL N/A 07/05/2015   Procedure: ESOPHAGOGASTRODUODENOSCOPY (EGD) WITH PROPOFOL;  Surgeon: Manus Gunning, MD;  Location: [facility];  Service: Gastroenterology;  Laterality: N/A;  . FISTULA SUPERFICIALIZATION Right 11/16/2018   Procedure: Fistula Superficialization;  Surgeon: Angelia Mould, MD;  Location: [facility];  Service: Vascular;  Laterality: Right;  . HERNIA REPAIR    . IR PERC TUN PERIT CATH WO PORT S&I D[place]a [place]  02/17/2020  . IR US GUIDE VASC ACCESS RIGHT  02/18/2020  . LEFT AND RIGHT HEART CATHETERIZATION WITH CORONARY ANGIOGRAM N/A 03/03/2013   Procedure: LEFT AND RIGHT HEART CATHETERIZATION WITH CORONARY ANGIOGRAM;  Surgeon: Minus Breeding, MD;  Location: [facility] C[facility] LAB;  Service: Cardiovascular;  Laterality: N/A;  . REPLACEMENT TOTAL KNEE Left 1998  . UPPER GASTROINTESTINAL ENDOSCOPY       OB History   No  obstetric history on file.     Family History  Problem Relation Age of Onset  . Liver cancer Mother        Liver Cancer  . Diabetes Mother   . Kidney disease Mother   . Heart disease Mother        age 1's  . Heart disease Father 55       MI  . Heart attack Father        died of MI when pt was 61  . Sickle cell anemia Father   . Colon cancer Brother   . Cancer Brother        Colon Cancer  . Diabetes Sister   . Kidney disease Sister   . Heart disease Sister        age 9's  . Allergies Sister   . Diabetes Sister   . Kidney disease Sister   . Heart disease Sister        age 62's  . Esophageal cancer Neg Hx   . Rectal cancer Neg Hx   . Stomach cancer Neg Hx   . Amblyopia Neg Hx   . Blindness Neg Hx   . Glaucoma Neg Hx   . Macular degeneration Neg Hx   . Retinal detachment Neg Hx   . Cataracts Neg Hx   . Strabismus Neg Hx   . Retinitis pigmentosa Neg Hx     Social History   Tobacco Use  . Smoking status: Former Smoker    Packs/day: 0.50    Years: 10.00    Pack years: 5.00    Quit date: 04/18/1980    Years since quitting: 39.9  . Smokeless tobacco: Never Used  Vaping Use  . Vaping Use: Never used  Substance Use Topics  . Alcohol use: No    Alcohol/week: 0.0 standard drinks  . Drug use: No    Home Medications Prior to Admission medications   Medication Sig Start Date End Date Taking? Authorizing Provider  acetaminophen (TYLENOL) 325 MG tablet Take 650 mg by mouth every 4 (four) hours as needed for moderate pain.    [provider]  albuterol (VENTOLIN HFA) 108 (90 Base) MCG/ACT inhaler Inhale 2 puffs into the lungs every 4 (four) hours as needed for wheezing or shortness of breath. 03/02/20   Raiford Noble Latif, DO  amLODipine (NORVASC) 5 MG tablet Take 1 tablet (5 mg total) by mouth every evening. 03/02/20   Raiford Noble Latif, DO  ascorbic acid (VITAMIN C) 500 MG tablet Take 1 tablet (500 mg total) by mouth daily. 03/03/20   Raiford Noble Latif, DO   aspirin EC 81 MG  tablet Take 81 mg by mouth daily.    [provider]  azelastine (ASTELIN) 0.1 % nasal spray Place 1 spray into both nostrils 2 (two) times daily as needed (allergies). Use in each nostril as directed 11/10/19   [name], MD  bisacodyl (DULCOLAX) 5 MG EC tablet Take 1 tablet (5 mg total) by mouth daily as needed for moderate constipation. 03/12/20   Debbe Odea, MD  calcium carbonate (OSCAL) 1500 (600 Ca) MG TABS tablet Take 1 tablet by mouth daily.    [provider]  carvedilol (COREG) 25 MG tablet Take 25 mg by mouth 2 (two) times daily. 01/09/19   [provider]  cholecalciferol (VITAMIN D) 25 MCG tablet Take 1 tablet (1,000 Units total) by mouth daily. 03/03/20   Raiford Noble Latif, DO  donepezil (ARICEPT) 5 MG tablet Take 1 tablet (5 mg total) by mouth at bedtime. 03/02/20   Raiford Noble Latif, DO  ezetimibe (ZETIA) 10 MG tablet Take 10 mg by mouth daily.    [provider]  ferrous sulfate 325 (65 FE) MG tablet Take 325 mg by mouth daily.    [provider]  Fluticasone-Umeclidin-Vilant (TRELEGY ELLIPTA) 100-62.5-25 MCG/INH AEPB Take 1 puff by mouth daily. 04/13/19   Lauraine Rinne, NP  gabapentin (NEURONTIN) 100 MG capsule Take 1 capsule (100 mg total) by mouth 2 (two) times daily. Patient taking differently: Take 100 mg by mouth at bedtime. 09/12/17   Hongalgi, Lenis Dickinson, MD  guaiFENesin-dextromethorphan (ROBITUSSIN DM) 100-10 MG/5ML syrup Take 10 mLs by mouth every 4 (four) hours as needed for cough. 03/02/20   Raiford Noble Latif, DO  hydrALAZINE (APRESOLINE) 100 MG tablet Take 1 tablet by mouth 3 (three) times daily. 05/10/19   [provider]  Ipratropium-Albuterol (COMBIVENT) 20-100 MCG/ACT AERS respimat Inhale 1 puff into the lungs every 6 (six) hours as needed for wheezing or shortness of breath. 03/02/20   Raiford Noble Latif, DO  lip balm (CARMEX) ointment Apply topically as needed for lip care. 03/02/20   Sheikh,  Omair Latif, DO  montelukast (SINGULAIR) 10 MG tablet Take 1 tablet (10 mg total) by mouth daily. Patient taking differently: Take 10 mg by mouth at bedtime. 10/22/17   Lauraine Rinne, NP  nitroGLYCERIN (NITROSTAT) 0.4 MG SL tablet PLACE ONE TABLET UNDER THE TONGUE EVERY FIVE MINUTES AS NEEDED FOR CHEST PAIN Patient taking differently: Place 0.4 mg under the tongue every 5 (five) minutes as needed for chest pain. 07/08/16   Minus Breeding, MD  omeprazole (PRILOSEC) 40 MG capsule Take 1 capsule (40 mg total) by mouth in the morning and at bedtime. Please schedule a yearly follow up: [phone]. Thank you 09/01/19   Yetta Flock, MD  OXYGEN Inhale 2 L into the lungs as needed. CPAP with oxygen at bedtime    [provider]  polyethylene glycol (MIRALAX) 17 g packet Take 17 g by mouth daily as needed. Patient taking differently: Take 17 g by mouth daily as needed (constipation.). 06/18/18   Armbruster, Carlota Raspberry, MD  Propylene Glycol (SYSTANE BALANCE) 0.6 % SOLN Place 1 drop into both eyes daily as needed (dry eyes).    [provider]  TRUEPLUS PEN NEEDLES 32G X 4 MM MISC INJECT THREE TIMES A DAY AS DIRECTED 07/08/17   [provider]  zinc sulfate 220 (50 Zn) MG capsule Take 1 capsule (220 mg total) by mouth daily. 03/03/20   Kerney Elbe, DO    Allergies  Morphine and related and Promethazine hcl  Review of Systems   Review of Systems  Unable to perform ROS: Mental status change    Physical Exam Updated Vital Signs BP 125/67   Pulse 94   Resp 16   SpO2 100%   Physical Exam Constitutional:      General: She is not in acute distress.    Appearance: Normal appearance. She is not ill-appearing, toxic-appearing or diaphoretic.     Comments: Patient initially with eyes closed, however after sternal rub eyes remained open for entire visit.  After sternal rub patient is spontaneously moving upper extremities, will localize pain to lower extremities  HENT:      Head: Normocephalic and atraumatic.     Mouth/Throat:     Mouth: Mucous membranes are moist.     Pharynx: Oropharynx is clear.  Eyes:     General: No scleral icterus.    Extraocular Movements: Extraocular movements intact.     Pupils: Pupils are equal, round, and reactive to light.  Cardiovascular:     Rate and Rhythm: Normal rate and regular rhythm.     Pulses: Normal pulses.     Heart sounds: Normal heart sounds.  Pulmonary:     Effort: Pulmonary effort is normal. No respiratory distress.     Breath sounds: Normal breath sounds. No stridor. No wheezing, rhonchi or rales.  Chest:     Chest wall: No tenderness.  Abdominal:     General: Abdomen is flat. There is no distension.     Palpations: Abdomen is soft.     Tenderness: There is no abdominal tenderness. There is no guarding or rebound.  Genitourinary:    Comments: Profuse diarrhea from diaper, nonbloody Musculoskeletal:        General: No swelling or tenderness. Normal range of motion.     Cervical back: Normal range of motion and neck supple. No rigidity.     Right lower leg: No edema.     Left lower leg: No edema.  Skin:    General: Skin is warm and dry.     Capillary Refill: Capillary refill takes less than 2 seconds.     Coloration: Skin is not pale.     Findings: No lesion or rash.     Comments: No lesions noted  Neurological:     Comments: Will mumble and make some incomprehensible tubal sounds.  Spontaneously moving upper extremities, will localize pain to lower extremities.  Will not follow commands.  No facial droop noted.  GCS 9  Psychiatric:        Mood and Affect: Mood normal.        Behavior: Behavior normal.     ED Results / Procedures / Treatments   Labs (all labs ordered are listed, but only abnormal results are displayed) Labs Reviewed  COMPREHENSIVE METABOLIC PANEL - Abnormal; Notable for the following components:      Result Value   Chloride 96 (*)    Glucose, Bld 151 (*)    Creatinine,  Ser 7.02 (*)    Calcium 8.3 (*)    Albumin 2.9 (*)    AST 43 (*)    GFR, Estimated 6 (*)    All other components within normal limits  CBC WITH DIFFERENTIAL/PLATELET - Abnormal; Notable for the following components:   WBC 10.8 (*)    RBC 3.55 (*)    Hemoglobin 8.4 (*)    HCT 28.9 (*)    MCH 23.7 (*)    MCHC 29.1 (*)  RDW 23.1 (*)    nRBC 0.9 (*)    Monocytes Absolute 2.7 (*)    Abs Immature Granulocytes 0.53 (*)    All other components within normal limits  I-STAT VENOUS BLOOD GAS, ED - Abnormal; Notable for the following components:   pO2, Ven 59.0 (*)    Bicarbonate 31.6 (*)    TCO2 33 (*)    Acid-Base Excess 6.0 (*)    Calcium, Ion 1.00 (*)    HCT 30.0 (*)    Hemoglobin 10.2 (*)    All other components within normal limits  URINE CULTURE  GASTROINTESTINAL PANEL BY PCR, STOOL (REPLACES STOOL CULTURE)  C DIFFICILE QUICK SCREEN W PCR REFLEX  URINALYSIS, COMPLETE (UACMP) WITH MICROSCOPIC  AMMONIA  CBG MONITORING, ED    EKG EKG Interpretation  Date/Time:  Wednesday March 29 2020 14:01:59 EST Ventricular Rate:  84 PR Interval:    QRS Duration: 89 QT Interval:  389 QTC Calculation: 460 R Axis:   64 Text Interpretation: Sinus rhythm Probable left atrial enlargement Anteroseptal infarct, age indeterminate Artifact Abnormal ECG Confirmed by Carmin Muskrat [phone]) on 03/29/2020 3:59:20 PM   Radiology CT HEAD WO CONTRAST  Result Date: 03/29/2020 CLINICAL DATA:  Altered mental status, delirium EXAM: CT HEAD WITHOUT CONTRAST TECHNIQUE: Contiguous axial images were obtained from the base of the skull through the vertex without intravenous contrast. COMPARISON:  03/09/2020 FINDINGS: Technical note: Examination is severely limited by patient motion artifact. Patient was uncooperative/combative. Study was repeated multiple times. Best possible images were submitted. Brain: No evidence of acute large territory infarct, intracranial hemorrhage, or extra-axial collection on limited  view. Vascular: Limited. Skull: No evidence of depressed calvarial fracture. Sinuses/Orbits: Grossly clear. Other: None. IMPRESSION: 1. Significantly limited, essentially nondiagnostic, study secondary to patient motion artifact. A repeat study should be performed when patient is cooperative and able to tolerate scan. 2. No gross acute intracranial abnormality evident within the limitations. Electronically Signed   By: Davina Poke D.O.   On: 03/29/2020 13:14   DG Chest Portable 1 View  Result Date: 03/29/2020 CLINICAL DATA:  Altered mental status EXAM: PORTABLE CHEST 1 VIEW COMPARISON:  03/09/2020 FINDINGS: Central line tip remains in the proximal right atrium. Chronic elevation of the right hemidiaphragm. Chronic cardiomegaly and aortic atherosclerosis. No evidence of heart failure or effusion. The left lung is clear. Mild chronic volume loss at the right base related to the elevated hemidiaphragm. No acute bone finding. IMPRESSION: No acute disease. Chronic elevation of the right hemidiaphragm with mild chronic volume loss at the right base. Electronically Signed   By: Nelson Chimes M.D.   On: 03/29/2020 13:21    Procedures Procedures   Medications Ordered in ED Medications - No data to display  ED Course  I have reviewed the triage vital signs and the nursing notes.  Pertinent labs & imaging results that were available during my care of the patient were reviewed by me and considered in my medical decision making (see chart for details).  Clinical Course as of 03/29/20 1646  Wed Mar 29, 2020  1401 BP(!): 156/138 [AW]    Clinical Course User Index [AW] Arnaldo Natal, MD   MDM Rules/Calculators/A&P                          Joann Gutierrez is a 75 y.o. female with friend past medical history of acute metabolic encephalopathy, dementia with behavioral disturbance, hypercholesteremia, OSA, hypertension, recent diagnosis of Covid,  ESRD on hemodialysis, CHF COPD with chronic hypoxemic  respiratory failure on 2 L of oxygen presents emerge department today for altered mental status.  Differential to include acute metabolic encephalopathy, hypercarbia, CVA.  Does not look septic with normal vital signs, however with profuse diarrhea will obtain GI panel and C. difficile screen with recent admission to the hospital.  Work-up today so far unremarkable CMP has no major electrolyte derangements besides creatinine of 7, patient was dialyzed yesterday.  CBC with hemoglobin of 8.4, this appears to be patient's baseline.  No hypercarbia on VBG.  Awaiting urine, unsure if patient makes urine.  On repeat evaluation, patient neuro exam has unchanged, spoke to daughter who states that she is normally talkative and this is not her baseline.  Patient will need to be admitted for altered mental status.  CT was limited due to motion artifact, no gross acute intracranial abnormality within the limitations.  May need an MRI once patient has been admitted.  No focal neuro deficits on my exam.  Spoke to Dr. Roosevelt Locks, [facility] hospitalist who will admit patient.  The patient appears reasonably stabilized for admission considering the current resources, flow, and capabilities available in the ED at this time, and I doubt any other [facility] requiring further screening and/or treatment in the ED prior to admission.   I discussed this case with my attending physician who cosigned this note including patient's presenting symptoms, physical exam, and planned diagnostics and interventions. Attending physician stated agreement with plan or made changes to plan which were implemented.   Attending physician assessed patient at bedside.   Final Clinical Impression(s) / ED Diagnoses Final diagnoses:  Delirium    Rx / DC Orders ED Discharge Orders    None       Alfredia Client, PA-C 03/29/20 1647    Carmin Muskrat, MD 03/31/20 1757

## 2020-03-29 NOTE — ED Notes (Signed)
Patient transported to CT 

## 2020-03-30 ENCOUNTER — Inpatient Hospital Stay (HOSPITAL_COMMUNITY): Payer: Medicare HMO

## 2020-03-30 DIAGNOSIS — R4182 Altered mental status, unspecified: Secondary | ICD-10-CM

## 2020-03-30 DIAGNOSIS — I10 Essential (primary) hypertension: Secondary | ICD-10-CM | POA: Diagnosis not present

## 2020-03-30 LAB — PHOSPHORUS: Phosphorus: 5.5 mg/dL — ABNORMAL HIGH (ref 2.5–4.6)

## 2020-03-30 LAB — BASIC METABOLIC PANEL
Anion gap: 12 (ref 5–15)
BUN: 26 mg/dL — ABNORMAL HIGH (ref 8–23)
CO2: 27 mmol/L (ref 22–32)
Calcium: 8.5 mg/dL — ABNORMAL LOW (ref 8.9–10.3)
Chloride: 98 mmol/L (ref 98–111)
Creatinine, Ser: 8.35 mg/dL — ABNORMAL HIGH (ref 0.44–1.00)
GFR, Estimated: 5 mL/min — ABNORMAL LOW (ref >60)
Glucose, Bld: 115 mg/dL — ABNORMAL HIGH (ref 70–99)
Potassium: 3.8 mmol/L (ref 3.5–5.1)
Sodium: 137 mmol/L (ref 135–145)

## 2020-03-30 LAB — CBC
HCT: 25.7 % — ABNORMAL LOW (ref 36.0–46.0)
Hemoglobin: 7.8 g/dL — ABNORMAL LOW (ref 12.0–15.0)
MCH: 24.1 pg — ABNORMAL LOW (ref 26.0–34.0)
MCHC: 30.4 g/dL (ref 30.0–36.0)
MCV: 79.6 fL — ABNORMAL LOW (ref 80.0–100.0)
Platelets: 188 10*3/uL (ref 150–400)
RBC: 3.23 MIL/uL — ABNORMAL LOW (ref 3.87–5.11)
RDW: 23 % — ABNORMAL HIGH (ref 11.5–15.5)
WBC: 9.3 10*3/uL (ref 4.0–10.5)
nRBC: 0.6 % — ABNORMAL HIGH (ref 0.0–0.2)

## 2020-03-30 LAB — TSH: TSH: 1.971 u[IU]/mL (ref 0.350–4.500)

## 2020-03-30 LAB — VITAMIN B12: Vitamin B-12: 410 pg/mL (ref 180–914)

## 2020-03-30 LAB — IRON AND TIBC
Iron: 30 ug/dL (ref 28–170)
Saturation Ratios: 16 % (ref 10.4–31.8)
TIBC: 188 ug/dL — ABNORMAL LOW (ref 250–450)
UIBC: 158 ug/dL

## 2020-03-30 LAB — FOLATE: Folate: 24 ng/mL (ref >5.9)

## 2020-03-30 LAB — SARS CORONAVIRUS 2 (TAT 6-24 HRS): SARS Coronavirus 2: NEGATIVE

## 2020-03-30 LAB — AMMONIA: Ammonia: 26 umol/L (ref 9–35)

## 2020-03-30 LAB — FERRITIN: Ferritin: 412 ng/mL — ABNORMAL HIGH (ref 11–307)

## 2020-03-30 MED ORDER — CHLORHEXIDINE GLUCONATE CLOTH 2 % EX PADS
6.0000 | MEDICATED_PAD | Freq: Every day | CUTANEOUS | Status: DC
  Administered 2020-03-31 – 2020-04-07 (×6): 6 via TOPICAL

## 2020-03-30 MED ORDER — DOXERCALCIFEROL 4 MCG/2ML IV SOLN: 3.0000 ug | INTRAVENOUS | Status: DC

## 2020-03-30 MED ORDER — DOXERCALCIFEROL 4 MCG/2ML IV SOLN
INTRAVENOUS | Status: AC
  Filled 2020-03-30: qty 2

## 2020-03-30 MED ORDER — GABAPENTIN 100 MG PO CAPS: 100.0000 mg | ORAL_CAPSULE | ORAL | Status: DC

## 2020-03-30 MED ORDER — DOXERCALCIFEROL 4 MCG/2ML IV SOLN
3.0000 ug | INTRAVENOUS | Status: DC
  Administered 2020-03-30 – 2020-04-08 (×4): 3 ug via INTRAVENOUS
  Filled 2020-03-30: qty 2

## 2020-03-30 MED ORDER — SODIUM CHLORIDE 0.9 % IV SOLN
50.0000 mg | INTRAVENOUS | Status: DC
  Filled 2020-03-30: qty 2.5

## 2020-03-30 NOTE — Progress Notes (Signed)
RN is rolling a pt , call back in 10 minutes

## 2020-03-30 NOTE — ED Notes (Signed)
Patient transported to CT 

## 2020-03-30 NOTE — Progress Notes (Signed)
EEG complete - results pending 

## 2020-03-30 NOTE — Procedures (Signed)
   I was present at this dialysis session, have reviewed the session itself and made  appropriate changes Kelly Splinter MD [place] pager [phone]   03/30/2020, 5:16 PM

## 2020-03-30 NOTE — Progress Notes (Signed)
PROGRESS NOTE    Joann Gutierrez  WRU:[id number] DOB: 04/12/1945 DOA: 03/29/2020 PCP: Andree Moro, DO   Chief Complaint  Patient presents with  . Altered Mental Status   Brief Narrative:  Joann Gutierrez is a 75 y.o. female with medical history significant of CKD stage V recently started HD from January 2022, COPD, chronic hypoxic respite failure secondary to COPD, OSA on CPAP HS, dementia, ambulation dysfunction/bedbound, HTN, HLD, presented with altered mentation.  At baseline, patient appears to be very talkative and responding to visitors promptly, but usually only oriented to herself and place and confused about time.  However this morning, patient was found with minimal response, awake, and confused.  Husband denies patient has any pain is until last night, no diarrhea and appears to be at her baseline otherwise.  Husband reported that patient appears to have had problems with urination " very little urine compared to before January", and patient appears to be sleepy after HD sessions (TTS), usually she will sleep the rest of the day and became more awake and next day.  Last HD was yesterday.  Husband visited her 8 PM last night, and found she was sleepy which appears to be her baseline as she just had HD in the morning. Husband reported that patient had couple bouts of diarrhea last week but resolved, no diarrhea last 2 days.  ED Course: Patient was found awake, minimal response, not following command.  CT had poor quality.  Work-up showed VBG normal pH, PCO2 59, BUN 20.  Urine analysis pending.  Assessment & Plan:   Active Problems:   AMS (altered mental status)  Acute Metabolic Encephalopathy  Hx Dementia Sounds like she's been declining generally since COVID infection (02/15/20) Fighting the staff yesterday morning, her daughter found her down on the ground VBG without hypercarbia, normal ammonia, TSH, vitamin B12, folate Head CT with stable atrophy with periventricular  small vessel disease, prior small infarct L cerebellum, no acute infarct, no mass or hemorrhage.  Foci of arterial vascular calcification, foci of paranasal sinus disease.   Will follow MRI brain  CXR without acute disease.  UA, culture pending Gabapentin d/c'd, appreciate renal assistance Delirium precautions  CKD stage V on HD -Consult nephrology for routine HD tomorrow  HTN -Controlled, continue home regimen  Advanced dementia -Baseline, patient cannot dress herself or taking shower, recently often need to be feds then feed her own. -Continue Aricept  Chronic ambulation dysfunction/bedbound -Significant deterioration of ambulation status since January 2022. -PT evaluation  OSA -CPAP HS.  DVT prophylaxis: heparin Code Status: full  Family Communication: daughter, son Disposition:   Status is: Inpatient  Remains inpatient appropriate because:Inpatient level of care appropriate due to severity of illness   Dispo: The patient is from: SNF              Anticipated d/c is to: SNF              Patient currently is not medically stable to d/c.   Difficult to place patient No   Consultants:   renal  Procedures:  none  Antimicrobials:  Anti-infectives (From admission, onward)   None     Subjective: No c/o pain/discomfort disoriented  Objective: Vitals:   03/30/20 0730 03/30/20 0745 03/30/20 0800 03/30/20 0815  BP: (!) 143/86  (!) 148/74   Pulse:   88   Resp: 17 14 12 18   Temp:      TempSrc:      SpO2:   96%  Weight:      Height:       No intake or output data in the 24 hours ending 03/30/20 0913 Filed Weights   03/29/20 1928  Weight: 109 kg    Examination:  General exam: Appears calm and comfortable  Respiratory system: Clear to auscultation. Respiratory effort normal. Cardiovascular system: S1 & S2 heard, RRR.  Dialysis catheter to R chest. Gastrointestinal system: Abdomen is nondistended, soft and nontender Central nervous system: Alert,  but disoriented - unable to answer questions regarding where she is or what month it is.  Incompletely following commands, but moving all extremities.   Extremities: no sig edema Skin: No rashes, lesions or ulcers  Data Reviewed: I have personally reviewed following labs and imaging studies  CBC: Recent Labs  Lab 03/29/20 1223 03/29/20 1336 03/30/20 0538  WBC 10.8*  --  9.3  NEUTROABS 6.6  --   --   HGB 8.4* 10.2* 7.8*  HCT 28.9* 30.0* 25.7*  MCV 81.4  --  79.6*  PLT 192  --  284    Basic Metabolic Panel: Recent Labs  Lab 03/29/20 1223 03/29/20 1336 03/30/20 0538  NA 135 137 137  K 4.6 4.5 3.8  CL 96*  --  98  CO2 25  --  27  GLUCOSE 151*  --  115*  BUN 20  --  26*  CREATININE 7.02*  --  8.35*  CALCIUM 8.3*  --  8.5*    GFR: Estimated Creatinine Clearance: 7.1 mL/min (A) (by C-G formula based on SCr of 8.35 mg/dL (H)).  Liver Function Tests: Recent Labs  Lab 03/29/20 1223  AST 43*  ALT 12  ALKPHOS 68  BILITOT 0.6  PROT 7.1  ALBUMIN 2.9*    CBG: No results for input(s): GLUCAP in the last 168 hours.   Recent Results (from the past 240 hour(s))  SARS CORONAVIRUS 2 (TAT 6-24 HRS) Nasopharyngeal Nasopharyngeal Swab     Status: None   Collection Time: 03/30/20  1:48 AM   Specimen: Nasopharyngeal Swab  Result Value Ref Range Status   SARS Coronavirus 2 NEGATIVE NEGATIVE Final    Comment: (NOTE) SARS-CoV-2 target nucleic acids are NOT DETECTED.  The SARS-CoV-2 RNA is generally detectable in upper and lower respiratory specimens during the acute phase of infection. Negative results do not preclude SARS-CoV-2 infection, do not rule out co-infections with other pathogens, and should not be used as the sole basis for treatment or other patient management decisions. Negative results must be combined with clinical observations, patient history, and epidemiological information. The expected result is Negative.  Fact Sheet for  Patients: SugarRoll.be  Fact Sheet for Healthcare Providers: https://www.woods-mathews.com/  This test is not yet approved or cleared by the [place] FDA and  has been authorized for detection and/or diagnosis of SARS-CoV-2 by FDA under an Emergency Use Authorization (EUA). This EUA will remain  in effect (meaning this test can be used) for the duration of the COVID-19 declaration under Se ction 564(b)(1) of the Act, 21 U.S.C. section 360bbb-3(b)(1), unless the authorization is terminated or revoked sooner.  Performed at [facility], [place] [address] [place], [place] [address]          Radiology Studies: CT HEAD WO CONTRAST  Result Date: 03/29/2020 CLINICAL DATA:  Altered mental status, delirium EXAM: CT HEAD WITHOUT CONTRAST TECHNIQUE: Contiguous axial images were obtained from the base of the skull through the vertex without intravenous contrast. COMPARISON:  03/09/2020 FINDINGS: Technical note: Examination is  severely limited by patient motion artifact. Patient was uncooperative/combative. Study was repeated multiple times. Best possible images were submitted. Brain: No evidence of acute large territory infarct, intracranial hemorrhage, or extra-axial collection on limited view. Vascular: Limited. Skull: No evidence of depressed calvarial fracture. Sinuses/Orbits: Grossly clear. Other: None. IMPRESSION: 1. Significantly limited, essentially nondiagnostic, study secondary to patient motion artifact. A repeat study should be performed when patient is cooperative and able to tolerate scan. 2. No gross acute intracranial abnormality evident within the limitations. Electronically Signed   By: Davina Poke D.O.   On: 03/29/2020 13:14   DG Chest Portable 1 View  Result Date: 03/29/2020 CLINICAL DATA:  Altered mental status EXAM: PORTABLE CHEST 1 VIEW COMPARISON:  03/09/2020 FINDINGS: Central line tip remains in the proximal right atrium.  Chronic elevation of the right hemidiaphragm. Chronic cardiomegaly and aortic atherosclerosis. No evidence of heart failure or effusion. The left lung is clear. Mild chronic volume loss at the right base related to the elevated hemidiaphragm. No acute bone finding. IMPRESSION: No acute disease. Chronic elevation of the right hemidiaphragm with mild chronic volume loss at the right base. Electronically Signed   By: Nelson Chimes M.D.   On: 03/29/2020 13:21        Scheduled Meds: . amLODipine  5 mg Oral QPM  . ascorbic acid  500 mg Oral Daily  . aspirin EC  81 mg Oral Daily  . calcium carbonate  1,250 mg Oral Daily  . carvedilol  25 mg Oral BID  . donepezil  5 mg Oral QHS  . ezetimibe  10 mg Oral Daily  . ferrous sulfate  325 mg Oral Daily  . fluticasone furoate-vilanterol  1 puff Inhalation Daily  . gabapentin  100 mg Oral QHS  . heparin  5,000 Units Subcutaneous Q12H  . hydrALAZINE  100 mg Oral TID  . montelukast  10 mg Oral QHS  . pantoprazole  40 mg Oral Daily  . umeclidinium bromide  1 puff Inhalation Daily   Continuous Infusions:   LOS: 1 day    Time spent: over 30 min    Fayrene Helper, MD [facility]   To contact the attending provider between 7A-7P or the covering provider during after hours 7P-7A, please log into the web site [email or web address] and access using universal [place] password for that web site. If you do not have the password, please call the hospital operator.  03/30/2020, 9:13 AM

## 2020-03-30 NOTE — Progress Notes (Signed)
PT Cancellation Note  Patient Details Name: Joann Gutierrez MRN: [id number] DOB: 1945/10/12   Cancelled Treatment:    Reason Eval/Treat Not Completed: Patient at procedure or test/unavailable.  Gone to CT and will retry as time and pt allow.   Ramond Dial 03/30/2020, 10:08 AM Mee Hives, PT MS Acute Rehab Dept. Number: [place]

## 2020-03-30 NOTE — Consult Note (Addendum)
Renal Service Consult Note Joann Gutierrez  Joann Gutierrez 03/30/2020 Sol Blazing, MD Requesting Physician: Dr. Florene Glen, C.   Reason for Consult: ESRD pt w/ AMS HPI: The patient is a 75 y.o. year-old w/ hx of dementia, recent COVID pna, recent new start to dialysis in Jan 2022, presented to ED w/ AMS, hx underlying dementia. Usually is interactive and was not acting right. In ED pt was awake but minimally responsive, not following commands. PCO2 on VBG was 59, BUN 20. BP's were wnl. Pt was admitted for AMS, unclear etiology.  UA was pending. Asked to see for renal failure.    Pt admitted 1/25- 03/02/20 for COVID pna, a/c resp failure, a/c renal failure started on hemodialysis, AMS, hx prior dementia, COPD, anemia. Was dc'd to SNF.   Pt admitted 2/16- 03/12/20 for AMS, hx dementia. Unclear etiology, possibly CO2 retention since she improved overnight w/ CPAP. Had HD x 2 while here. COPD on chronic O2.    ROS  denies CP  no joint pain   no HA  no blurry vision  no rash  no diarrhea  no nausea/ vomiting  no dysuria  no difficulty voiding  no change in urine color    Past Medical History  Past Medical History:  Diagnosis Date  . Adenomatous colon polyp   . Allergy   . Anemia   . Asthma       . CAD (coronary artery disease)    Mild very minimal coronary disease with 20% obtuse marginal stenosis  . Carpal tunnel syndrome on left   . Cataract   . CHF (congestive heart failure) ([place])   . Chronic kidney disease (CKD), stage III (moderate) (HCC)    now stage 4  . COPD (chronic obstructive pulmonary disease) ([place])   . Dementia with behavioral disturbance ([place]) 03/09/2020  . Depression   . 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 Diverticulosis   . Dyspnea   . 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 Esophageal dysmotility   . Esophageal stricture   . ESRD on hemodialysis ([place]) 03/09/2020  . Gastritis   . Gastroparesis 08/21/2007  . GERD  (gastroesophageal reflux disease)   . Hearing loss of both ears   . Hernia, hiatal   . Hyperlipidemia   . Hypertension   . Morbid obesity ([place])   . OSTEOARTHRITIS 08/09/2006  . Osteoporosis   . Oxygen deficiency    prn   . 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 RENAL INSUFFICIENCY 02/16/2009  . 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 Seizures ([place])    pt thinks it has been several monthes since she had a seisure  . Shingles   . Sickle cell trait ([place])   . Sleep apnea    uses cpap  . Stroke [facility])    Past Surgical History  Past Surgical History:  Procedure Laterality Date  . ABDOMINAL HYSTERECTOMY    . ARTERY BIOPSY  01/07/2011   Procedure: MINOR BIOPSY TEMPORAL ARTERY;  Surgeon: Haywood Lasso, MD;  Location: [place];  Service: General;  Laterality: Left;  left temporal artery biopsy  . AV FISTULA PLACEMENT Right 11/16/2018   Procedure: CREATION RIGHT BRACHIOCEPHALIC FISTULA  ARTERIOVENOUS FISTULA;  Surgeon: Angelia Mould, MD;  Location: [place];  Service: Vascular;  Laterality: Right;  . bil foot surgery    . BREAST LUMPECTOMY     benign  . BREAST LUMPECTOMY     both breast  lumps removed   . CATARACT EXTRACTION Left 06/2016   Dr. Read Drivers  . COLONOSCOPY    . COLONOSCOPY WITH PROPOFOL N/A 07/05/2015   Procedure: COLONOSCOPY WITH PROPOFOL;  Surgeon: Manus Gunning, MD;  Location: [facility];  Service: Gastroenterology;  Laterality: N/A;  . COLONOSCOPY WITH PROPOFOL N/A 08/04/2018   Procedure: COLONOSCOPY WITH PROPOFOL;  Surgeon: Yetta Flock, MD;  Location: [facility];  Service: Gastroenterology;  Laterality: N/A;  . ERD  08/08/2000  . ESOPHAGEAL MANOMETRY N/A 03/13/2015   Procedure: ESOPHAGEAL MANOMETRY (EM);  Surgeon: Manus Gunning, MD;  Location: [facility];  Service: Gastroenterology;  Laterality: N/A;  . ESOPHAGOGASTRODUODENOSCOPY  06/25/2006  . ESOPHAGOGASTRODUODENOSCOPY (EGD) WITH PROPOFOL N/A 07/05/2015    Procedure: ESOPHAGOGASTRODUODENOSCOPY (EGD) WITH PROPOFOL;  Surgeon: Manus Gunning, MD;  Location: [facility];  Service: Gastroenterology;  Laterality: N/A;  . FISTULA SUPERFICIALIZATION Right 11/16/2018   Procedure: Fistula Superficialization;  Surgeon: Angelia Mould, MD;  Location: [facility];  Service: Vascular;  Laterality: Right;  . HERNIA REPAIR    . IR PERC TUN PERIT CATH WO PORT S&I Dartha Lodge  02/17/2020  . IR US GUIDE VASC ACCESS RIGHT  02/18/2020  . LEFT AND RIGHT HEART CATHETERIZATION WITH CORONARY ANGIOGRAM N/A 03/03/2013   Procedure: LEFT AND RIGHT HEART CATHETERIZATION WITH CORONARY ANGIOGRAM;  Surgeon: Minus Breeding, MD;  Location: [facility];  Service: Cardiovascular;  Laterality: N/A;  . REPLACEMENT TOTAL KNEE Left 1998  . UPPER GASTROINTESTINAL ENDOSCOPY     Family History  Family History  Problem Relation Age of Onset  . Liver cancer Mother        Liver Cancer  . Diabetes Mother   . Kidney disease Mother   . Heart disease Mother        age 32's  . Heart disease Father 97       MI  . Heart attack Father        died of MI when pt was 44  . Sickle cell anemia Father   . Colon cancer Brother   . Cancer Brother        Colon Cancer  . Diabetes Sister   . Kidney disease Sister   . Heart disease Sister        age 40's  . Allergies Sister   . Diabetes Sister   . Kidney disease Sister   . Heart disease Sister        age 41's  . Esophageal cancer Neg Hx   . Rectal cancer Neg Hx   . Stomach cancer Neg Hx   . Amblyopia Neg Hx   . Blindness Neg Hx   . Glaucoma Neg Hx   . Macular degeneration Neg Hx   . Retinal detachment Neg Hx   . Cataracts Neg Hx   . Strabismus Neg Hx   . Retinitis pigmentosa Neg Hx    Social History  reports that she quit smoking about 39 years ago. She has a 5.00 pack-year smoking history. She has never used smokeless tobacco. She reports that she does not drink alcohol and does not use drugs. Allergies  Allergies  Allergen  Reactions  . Morphine And Related Other (See Comments)    Family request not to be given, reports pt does not wake up when given   . Promethazine Hcl Other (See Comments)    REACTION: lethargy   Home medications Prior to Admission medications   Medication Sig Start Date End Date Taking? Authorizing Provider  acetaminophen (TYLENOL)  325 MG tablet Take 650 mg by mouth every 4 (four) hours as needed for moderate pain.   Yes [provider]  albuterol (VENTOLIN HFA) 108 (90 Base) MCG/ACT inhaler Inhale 2 puffs into the lungs every 4 (four) hours as needed for wheezing or shortness of breath. 03/02/20  Yes Sheikh, Omair Latif, DO  amLODipine (NORVASC) 5 MG tablet Take 1 tablet (5 mg total) by mouth every evening. 03/02/20  Yes Sheikh, Omair Latif, DO  ascorbic acid (VITAMIN C) 500 MG tablet Take 1 tablet (500 mg total) by mouth daily. 03/03/20  Yes Sheikh, Omair Latif, DO  aspirin EC 81 MG tablet Take 81 mg by mouth daily.   Yes [provider]  azelastine (ASTELIN) 0.1 % nasal spray Place 1 spray into both nostrils 2 (two) times daily as needed (allergies). Use in each nostril as directed 11/10/19  Yes [name], MD  bisacodyl (DULCOLAX) 5 MG EC tablet Take 1 tablet (5 mg total) by mouth daily as needed for moderate constipation. 03/12/20  Yes Debbe Odea, MD  calcium carbonate (OSCAL) 1500 (600 Ca) MG TABS tablet Take 1 tablet by mouth daily.   Yes [provider]  carvedilol (COREG) 25 MG tablet Take 25 mg by mouth 2 (two) times daily. 01/09/19  Yes [provider]  cholecalciferol (VITAMIN D) 25 MCG tablet Take 1 tablet (1,000 Units total) by mouth daily. 03/03/20  Yes Sheikh, Omair Latif, DO  donepezil (ARICEPT) 5 MG tablet Take 1 tablet (5 mg total) by mouth at bedtime. 03/02/20  Yes Sheikh, Omair Latif, DO  ezetimibe (ZETIA) 10 MG tablet Take 10 mg by mouth daily.   Yes [provider]  ferrous sulfate 325 (65 FE) MG tablet Take 325 mg by mouth  daily.   Yes [provider]  Fluticasone-Umeclidin-Vilant (TRELEGY ELLIPTA) 100-62.5-25 MCG/INH AEPB Take 1 puff by mouth daily. 04/13/19  Yes Lauraine Rinne, NP  gabapentin (NEURONTIN) 100 MG capsule Take 1 capsule (100 mg total) by mouth 2 (two) times daily. 09/12/17  Yes Hongalgi, Lenis Dickinson, MD  guaiFENesin-dextromethorphan (ROBITUSSIN DM) 100-10 MG/5ML syrup Take 10 mLs by mouth every 4 (four) hours as needed for cough. 03/02/20  Yes Sheikh, Omair Latif, DO  hydrALAZINE (APRESOLINE) 100 MG tablet Take 1 tablet by mouth 3 (three) times daily. 05/10/19  Yes [provider]  insulin aspart (NOVOLOG) 100 UNIT/ML injection Inject 2-10 Units into the skin 3 (three) times daily before meals. 0-59 = notify MD 60-150 = 0 units 151-199 = 2 units 200-249 = 4 units 250-299 = 6 units 300-349 = 8 units 350-399 = 10 units [phone] = notify MD   Yes [provider]  insulin glargine (LANTUS) 100 UNIT/ML Solostar Pen Inject 10 Units into the skin daily.   Yes [provider]  Ipratropium-Albuterol (COMBIVENT) 20-100 MCG/ACT AERS respimat Inhale 1 puff into the lungs every 6 (six) hours as needed for wheezing or shortness of breath. 03/02/20  Yes Sheikh, Omair Latif, DO  lip balm (CARMEX) ointment Apply topically as needed for lip care. 03/02/20  Yes Sheikh, Omair Latif, DO  montelukast (SINGULAIR) 10 MG tablet Take 1 tablet (10 mg total) by mouth daily. 10/22/17  Yes Lauraine Rinne, NP  nitroGLYCERIN (NITROSTAT) 0.4 MG SL tablet PLACE ONE TABLET UNDER THE TONGUE EVERY FIVE MINUTES AS NEEDED FOR CHEST PAIN Patient taking differently: Place 0.4 mg under the tongue every 5 (five) minutes as needed for chest pain. 07/08/16  Yes Minus Breeding, MD  Nutritional  Supplements (NEPRO) LIQD Take 237 mLs by mouth 2 (two) times daily. For caloric/protein   Yes [provider]  omeprazole (PRILOSEC) 40 MG capsule Take 1 capsule (40 mg total) by mouth in the morning and at bedtime. Please  schedule a yearly follow up: [phone]. Thank you 09/01/19  Yes Armbruster, Carlota Raspberry, MD  OXYGEN Inhale 2 L into the lungs as needed. CPAP with oxygen at bedtime   Yes [provider]  polyethylene glycol (MIRALAX) 17 g packet Take 17 g by mouth daily as needed. Patient taking differently: Take 17 g by mouth daily as needed (constipation.). 06/18/18  Yes Armbruster, Carlota Raspberry, MD  Propylene Glycol (SYSTANE BALANCE) 0.6 % SOLN Place 1 drop into both eyes daily as needed (dry eyes).   Yes [provider]  TRUEPLUS PEN NEEDLES 32G X 4 MM MISC INJECT THREE TIMES A DAY AS DIRECTED 07/08/17  Yes [provider]  zinc sulfate 220 (50 Zn) MG capsule Take 1 capsule (220 mg total) by mouth daily. 03/03/20  Yes Raiford Noble [place], [place]     Vitals:   03/30/20 0730 03/30/20 0745 03/30/20 0800 03/30/20 0815  BP: (!) 143/86  (!) 148/74   Pulse:   88   Resp: 17 14 12 18   Temp:      TempSrc:      SpO2:   96%   Weight:      Height:       Exam Gen lethargic, opens eyes to voice, doesn't follow commands, minimal verbalization No rash, cyanosis or gangrene Sclera anicteric, throat clear  No jvd or bruits Chest clear bilat to bases, no rales/ wheezing RRR no MRG Abd soft ntnd no mass or ascites +bs GU normal MS no joint effusions or deformity Ext no LE or UE edema, no wounds or ulcers Neuro is nonfocal, as above  RIJ TDC + in palce       Home meds:  - norvasc 5/ coreg 25 bid/ hydralazine 100 tid  - sl ntg prn/ asa 81  - prilosec 40/ zetia 10 qd  - singulair 10/ combivent qid prn/ trelegy ellipta qd/ home O2 2L   - neurontin 100 bid/ aricept 5 hs    Na 137  K 3.8  BN 26  Cr 8.5  Co2 27  WBC 9.3  Hb 7.8     CXR 3/9 -  IMPRESSION: No acute disease. Chronic elevation of the right hemidiaphragm with mild chronic volume loss at the right base.    OP HD: TTS  3.5h  400/800   107kg  2/2 bath  RIJ TDC Hep none  - hect 3ug tiw  - mircera 225 q2 , last 3/3  - venofer 50 mg  per wk   Assessment/ Plan: 1. AMS - in pt w/ underlying dementia. Unclear etiology, possibly meds, infection, etc. Recommend dc neurontin altogether, common cause of AMS in esrd pts.  Has not missed HD, outpt HD clearing %'tage was in proper range when recently checked. Plan is for HD today upstairs on schedule.  2. ESRD - recent start to HD in Jan 2022. HD TTS.  HD today as above.  3. Recent COVID Pna admission 4. Dementia - advanced at baseline 5. HTN - cont meds as needed 6. Chronic debility- pt is bedbound 7. OSA 8. Anemia ckd - next esa due on 3/17.  9. MBD ckd - cont vdra, check phos      Kelly Splinter  MD 03/30/2020, 9:49 AM  Recent  Labs  Lab 03/29/20 1223 03/29/20 1336 03/30/20 0538  WBC 10.8*  --  9.3  HGB 8.4* 10.2* 7.8*   Recent Labs  Lab 03/29/20 1223 03/29/20 1336 03/30/20 0538  K 4.6 4.5 3.8  BUN 20  --  26*  CREATININE 7.02*  --  8.35*  CALCIUM 8.3*  --  8.5*

## 2020-03-30 NOTE — Procedures (Signed)
Patient Name: Joann Gutierrez  MRN: [id number]  Epilepsy Attending: Lora Havens  Referring Physician/Provider: Dr Fayrene Helper Date: 03/30/2020 Duration: 22.42 mins  Patient history: 75yo F with ams. EEG to evaluate for seizure  Level of alertness: Awake  AEDs during EEG study: None  Technical aspects: This EEG study was done with scalp electrodes positioned according to the 10-20 International system of electrode placement. Electrical activity was acquired at a sampling rate of 500Hz  and reviewed with a high frequency filter of 70Hz  and a low frequency filter of 1Hz . EEG data were recorded continuously and digitally stored.   Description: EEG showed continuous generalized 3 to 6 Hz theta-delta slowing admixed with 15-18Hz  frontocentral beta activity. Physiologic photic driving was not seen during photic stimulation.  Hyperventilation was not performed.     ABNORMALITY -Continuous slow, generalized  IMPRESSION: This study is suggestive of moderate diffuse encephalopathy, nonspecific etiology. No seizures or epileptiform discharges were seen throughout the recording.  Joann Gutierrez

## 2020-03-30 NOTE — Progress Notes (Signed)
Patient admitted to [phone] from ED via stretcher. Belongings present with patient were black hair bonnet and pair of socks. Patient able to tell RN her name but not oriented to place, time, or situation. No visitors currently with patient. Placed patient on telemetry monitor. CHG bath completed and purewick in place.

## 2020-03-31 ENCOUNTER — Inpatient Hospital Stay (HOSPITAL_COMMUNITY): Payer: Medicare HMO

## 2020-03-31 DIAGNOSIS — I6389 Other cerebral infarction: Secondary | ICD-10-CM | POA: Diagnosis not present

## 2020-03-31 DIAGNOSIS — R4182 Altered mental status, unspecified: Secondary | ICD-10-CM

## 2020-03-31 DIAGNOSIS — I10 Essential (primary) hypertension: Secondary | ICD-10-CM | POA: Diagnosis not present

## 2020-03-31 LAB — CBC WITH DIFFERENTIAL/PLATELET
Abs Immature Granulocytes: 0.13 10*3/uL — ABNORMAL HIGH (ref 0.00–0.07)
Basophils Absolute: 0 10*3/uL (ref 0.0–0.1)
Basophils Relative: 0 %
Eosinophils Absolute: 0 10*3/uL (ref 0.0–0.5)
Eosinophils Relative: 0 %
HCT: 26.5 % — ABNORMAL LOW (ref 36.0–46.0)
Hemoglobin: 7.8 g/dL — ABNORMAL LOW (ref 12.0–15.0)
Immature Granulocytes: 2 %
Lymphocytes Relative: 17 %
Lymphs Abs: 1.1 10*3/uL (ref 0.7–4.0)
MCH: 23.8 pg — ABNORMAL LOW (ref 26.0–34.0)
MCHC: 29.4 g/dL — ABNORMAL LOW (ref 30.0–36.0)
MCV: 80.8 fL (ref 80.0–100.0)
Monocytes Absolute: 2.6 10*3/uL — ABNORMAL HIGH (ref 0.1–1.0)
Monocytes Relative: 40 %
Neutro Abs: 2.7 10*3/uL (ref 1.7–7.7)
Neutrophils Relative %: 41 %
Platelets: 143 10*3/uL — ABNORMAL LOW (ref 150–400)
RBC: 3.28 MIL/uL — ABNORMAL LOW (ref 3.87–5.11)
RDW: 22.5 % — ABNORMAL HIGH (ref 11.5–15.5)
WBC: 6.6 10*3/uL (ref 4.0–10.5)
nRBC: 0.8 % — ABNORMAL HIGH (ref 0.0–0.2)

## 2020-03-31 LAB — COMPREHENSIVE METABOLIC PANEL
ALT: 8 U/L (ref 0–44)
AST: 38 U/L (ref 15–41)
Albumin: 2.6 g/dL — ABNORMAL LOW (ref 3.5–5.0)
Alkaline Phosphatase: 60 U/L (ref 38–126)
Anion gap: 11 (ref 5–15)
BUN: 10 mg/dL (ref 8–23)
CO2: 24 mmol/L (ref 22–32)
Calcium: 8.3 mg/dL — ABNORMAL LOW (ref 8.9–10.3)
Chloride: 106 mmol/L (ref 98–111)
Creatinine, Ser: 4.43 mg/dL — ABNORMAL HIGH (ref 0.44–1.00)
GFR, Estimated: 10 mL/min — ABNORMAL LOW (ref >60)
Glucose, Bld: 112 mg/dL — ABNORMAL HIGH (ref 70–99)
Potassium: 4.1 mmol/L (ref 3.5–5.1)
Sodium: 141 mmol/L (ref 135–145)
Total Bilirubin: 0.4 mg/dL (ref 0.3–1.2)
Total Protein: 6.6 g/dL (ref 6.5–8.1)

## 2020-03-31 LAB — ECHOCARDIOGRAM COMPLETE
Area-P 1/2: 2.87 cm2
Height: 64 in
S' Lateral: 1.9 cm
Weight: 3604.96 oz

## 2020-03-31 LAB — PATHOLOGIST SMEAR REVIEW

## 2020-03-31 LAB — GLUCOSE, CAPILLARY
Glucose-Capillary: 99 mg/dL (ref 70–99)
Glucose-Capillary: 104 mg/dL — ABNORMAL HIGH (ref 70–99)

## 2020-03-31 LAB — RPR: RPR Ser Ql: NONREACTIVE

## 2020-03-31 LAB — MAGNESIUM: Magnesium: 2 mg/dL (ref 1.7–2.4)

## 2020-03-31 LAB — PHOSPHORUS: Phosphorus: 3.7 mg/dL (ref 2.5–4.6)

## 2020-03-31 MED ORDER — INSULIN ASPART 100 UNIT/ML ~~LOC~~ SOLN
0.0000 [IU] | Freq: Three times a day (TID) | SUBCUTANEOUS | Status: DC
  Administered 2020-04-02 – 2020-04-03 (×2): 1 [IU] via SUBCUTANEOUS
  Administered 2020-04-05: 2 [IU] via SUBCUTANEOUS
  Administered 2020-04-07: 1 [IU] via SUBCUTANEOUS

## 2020-03-31 MED ORDER — ATORVASTATIN CALCIUM 40 MG PO TABS
40.0000 mg | ORAL_TABLET | Freq: Every day | ORAL | Status: DC
  Administered 2020-03-31 – 2020-04-05 (×4): 40 mg via ORAL
  Filled 2020-03-31 (×8): qty 1

## 2020-03-31 MED ORDER — STROKE: EARLY STAGES OF RECOVERY BOOK
Freq: Once | Status: AC
  Filled 2020-03-31: qty 1

## 2020-03-31 NOTE — Evaluation (Signed)
Physical Therapy Evaluation Patient Details Name: Joann Gutierrez MRN: [id number] DOB: 04-02-1945 Today's Date: 03/31/2020   History of Present Illness  75 y.o. female presented via EMS with altered mentation with minimal response and no command follow. Admitted 03/29/20 for acute metabolic encephalopathy. CT with stable atrophy with periventricular small vessel disease, prior small infarct L cerebellum, no acute infarct. EEG moderate diffuse encephalopathy nonspecific etiology MRI revealed Multifocal acute ischemia in a deep watershed pattern, left worse than right. PMH: CKD stage V recently started HD from January 2022 after hospitalization for COVID , COPD, chronic hypoxic respite failure secondary to COPD, OSA on CPAP HS, dementia, ambulation dysfunction/bedbound, HTN, HLD,  Clinical Impression  PTA pt living at [facility]. Pt limited by decreased cognition, oriented only to self, able to answer some questions fluently other times jumbled mumbbling. Able to follow single step commands 75% of the time. Pt is min A for coming to EoB with HoB elevated and increased cuing. Pt sat EOB for approx 8 min with fair balance. Despite max encouragement pt would not stand. Likely has strength to stand but when attempted to assist she resisted. Pt requires modA for returning to bed for management of R LE which is painful. PT recommending return to [facility] when medically stable. PT will continue to work with pt to try to progress mobility.    Follow Up Recommendations SNF    Equipment Recommendations  None recommended by PT (has necessary equipment)    Recommendations for Other Services OT consult     Precautions / Restrictions Precautions Precautions: Fall Restrictions Weight Bearing Restrictions: No      Mobility  Bed Mobility Overal bed mobility: Needs Assistance Bed Mobility: Supine to Sit;Sit to Supine     Supine to sit: Min assist Sit to supine: Mod assist    General bed mobility comments: min A for managment of R LE, and maximal cuing for hand placement with HoB elevated, modA for return to bed only for management of RLE back to bed    Transfers     Transfers: Sit to/from Stand           General transfer comment: attempted with placement of RW in front of her and with HHA but pt with no assist          Balance Overall balance assessment: Needs assistance Sitting-balance support: No upper extremity supported;Feet supported Sitting balance-[name]: Fair Sitting balance - Comments: able to maintain sitting balance for approx 8 min                                     Pertinent Vitals/Pain Pain Assessment: Faces Faces Pain Scale: Hurts little more Pain Location: R LE and back with movement Pain Descriptors / Indicators: Discomfort;Grimacing;Guarding Pain Intervention(s): Limited activity within patient's tolerance;Monitored during session;Repositioned    Home Living Family/patient expects to be discharged to:: Skilled nursing facility [facility])                      Prior Function Level of Independence: Needs assistance   Gait / Transfers Assistance Needed: reports up to w/c but not sure how  ADL's / Homemaking Assistance Needed: garbled response, assuming full care        Hand Dominance        Extremity/Trunk Assessment   Upper Extremity Assessment Upper Extremity Assessment: Defer to OT evaluation  Lower Extremity Assessment Lower Extremity Assessment: RLE deficits/detail;Generalized weakness RLE Deficits / Details: decreased R LE knee flexion with increased grimace when attempt PROM    Cervical / Trunk Assessment Cervical / Trunk Assessment: Kyphotic  Communication   Communication: HOH;Expressive difficulties  Cognition Arousal/Alertness: Awake/alert Behavior During Therapy: Flat affect Overall Cognitive Status: No family/caregiver present to determine baseline  cognitive functioning                                 General Comments: disoriented, answers to name, follows one step commands with increased time and restatement      General Comments General comments (skin integrity, edema, etc.): No family present, appears to be well cared for    Exercises General Exercises - Lower Extremity Ankle Circles/Pumps: AROM;Both;10 reps;Supine   Assessment/Plan    PT Assessment Patient needs continued PT services  PT Problem List Decreased strength;Decreased range of motion;Decreased activity tolerance;Decreased balance;Decreased mobility;Decreased coordination;Decreased cognition;Obesity;Pain       PT Treatment Interventions Gait training;DME instruction;Functional mobility training;Therapeutic activities;Therapeutic exercise;Balance training;Cognitive remediation;Patient/family education    PT Goals (Current goals can be found in the Care Plan section)  Acute Rehab PT Goals Patient Stated Goal: to lay back down PT Goal Formulation: Patient unable to participate in goal setting Time For Goal Achievement: 04/14/20 Potential to Achieve Goals: Fair    Frequency Min 2X/week    AM-PAC PT "6 Clicks" Mobility  Outcome Measure Help needed turning from your back to your side while in a flat bed without using bedrails?: Total Help needed moving from lying on your back to sitting on the side of a flat bed without using bedrails?: Total Help needed moving to and from a bed to a chair (including a wheelchair)?: Total Help needed standing up from a chair using your arms (e.g., wheelchair or bedside chair)?: Total Help needed to walk in hospital room?: Total Help needed climbing 3-5 steps with a railing? : Total 6 Click Score: 6    End of Session Equipment Utilized During Treatment: Gait belt Activity Tolerance: Patient tolerated treatment well Patient left: in bed;with bed alarm set;with call bell/phone within reach Nurse Communication:  Mobility status PT Visit Diagnosis: Unsteadiness on feet (R26.81);Other abnormalities of gait and mobility (R26.89);Muscle weakness (generalized) (M62.81);Difficulty in walking, not elsewhere classified (R26.2)    Time: 0930-1002 PT Time Calculation (min) (ACUTE ONLY): 32 min   Charges:   PT Evaluation $PT Eval Moderate Complexity: 1 Mod PT Treatments $Therapeutic Activity: 8-22 mins        Jaciel Diem B. Migdalia Dk PT, DPT Acute Rehabilitation Services Pager [phone] Office [phone]   [place] 03/31/2020, 10:24 AM

## 2020-03-31 NOTE — Consult Note (Addendum)
Neurology Consultation Reason for Consult: AMS Requesting Physician: Fayrene Helper  CC: AMS  History is obtained from: chart review, patient  HPI: Joann Gutierrez is a 75 y.o. female who presented from SNF for acute worsening of mental status on baseline advanced dementia. At baseline, patient oriented to self and place but unable to complete most ADLs independently including needing intermittent feeding assistance. 3/9 was found to be non-verbal and acting combative which is atypical for her.  Today, she has no complaints but does endorse having gas.  Patient had colonoscopy 2020 with adenoma present and recommended f/u in 5 years. Last mammogram in chart 2011.  LKW: 3/9 morning tPA given?: No, outside of window IA performed?: No Premorbid modified rankin scale: 5     0 - No symptoms.     1 - No significant disability. Able to carry out all usual activities, despite some symptoms.     2 - Slight disability. Able to look after own affairs without assistance, but unable to carry out all previous activities.     3 - Moderate disability. Requires some help, but able to walk unassisted.     4 - Moderately severe disability. Unable to attend to own bodily needs without assistance, and unable to walk unassisted.     5 - Severe disability. Requires constant nursing care and attention, bedridden, incontinent.     6 - Dead. GCS: 13-15 is 0 points  ROS: All other review of systems was negative except as noted in the HPI. Unable to obtain due to altered mental status.   Past Medical History:  Diagnosis Date   Adenomatous colon polyp    Allergy    Anemia    Asthma        CAD (coronary artery disease)    Mild very minimal coronary disease with 20% obtuse marginal stenosis   Carpal tunnel syndrome on left    Cataract    CHF (congestive heart failure) (HCC)    Chronic kidney disease (CKD), stage III (moderate) (HCC)    now stage 4   COPD (chronic obstructive pulmonary disease) (HCC)     Dementia with behavioral disturbance ([place]) 03/09/2020   Depression    Diabetes mellitus 1997   Type II    Diverticulosis    Dyspnea    Elevated diaphragm November 2011   Right side   Esophageal dysmotility    Esophageal stricture    ESRD on hemodialysis ([place]) 03/09/2020   Gastritis    Gastroparesis 08/21/2007   GERD (gastroesophageal reflux disease)    Hearing loss of both ears    Hernia, hiatal    Hyperlipidemia    Hypertension    Morbid obesity ([place])    OSTEOARTHRITIS 08/09/2006   Osteoporosis    Oxygen deficiency    prn    PERIPHERAL NEUROPATHY, FEET 09/23/2007   RENAL INSUFFICIENCY 02/16/2009   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   Seizures ([place])    pt thinks it has been several monthes since she had a seisure   Shingles    Sickle cell trait ([place])    Sleep apnea    uses cpap   Stroke [facility])     Family History  Problem Relation Age of Onset   Liver cancer Mother        Liver Cancer   Diabetes Mother    Kidney disease Mother    Heart disease Mother        age 15's   Heart disease Father 60  MI   Heart attack Father        died of MI when pt was 5   Sickle cell anemia Father    Colon cancer Brother    Cancer Brother        Colon Cancer   Diabetes Sister    Kidney disease Sister    Heart disease Sister        age 33's   Allergies Sister    Diabetes Sister    Kidney disease Sister    Heart disease Sister        age 78's   Esophageal cancer Neg Hx    Rectal cancer Neg Hx    Stomach cancer Neg Hx    Amblyopia Neg Hx    Blindness Neg Hx    Glaucoma Neg Hx    Macular degeneration Neg Hx    Retinal detachment Neg Hx    Cataracts Neg Hx    Strabismus Neg Hx    Retinitis pigmentosa Neg Hx     Social History:  reports that she quit smoking about 39 years ago. She has a 5.00 pack-year smoking history. She has never used smokeless tobacco. She reports that she does not drink alcohol and does not use drugs.  Exam: Current vital signs: BP 116/61  (BP Location: Left Wrist)   Pulse 80   Temp 98.2 F (36.8 C) (Oral)   Resp 16   Ht 5\' 4"  (1.626 m)   Wt 102.2 kg   SpO2 92%   BMI 38.67 kg/m  Vital signs in last 24 hours: Temp:  [98.1 F (36.7 C)-99.4 F (37.4 C)] 98.2 F (36.8 C) (03/11 1003) Pulse Rate:  [77-89] 80 (03/11 0444) Resp:  [14-19] 16 (03/11 0444) BP: (84-145)/(47-85) 116/61 (03/11 1003) SpO2:  [90 %-97 %] 92 % (03/11 1003) Weight:  [102.2 kg-102.7 kg] 102.2 kg (03/10 1708)  Physical Exam  Constitutional: Appears well-developed and well-nourished.  Psych: Affect appropriate to situation Eyes: No scleral injection HENT: No oropharyngeal obstruction. Poor dentition MSK: no joint deformities.  Cardiovascular: Normal rate and regular rhythm.  Respiratory: Effort normal, non-labored breathing GI: Soft.  No distension. There is no tenderness.  Skin: Warm dry and intact visible skin  Neuro: Mental Status: Patient is easily awakened by voice and maintains alertness throughout conversation. She is responsive to all questions with affirmative and clear yes response verbally and head nod. Unable to comprehend speech beyond simple words like "yes" and "gas". Patient appears confused when asked orientation questions. So, negative to self, place, or time.  Patient is unable to give clear history No signs of neglect Cranial Nerves: II: Visual Fields are unable to assess. Pupils are equal, round, and reactive to light.   III,IV, VI: EOMI unable to be assessed. positive fluid tracking of me as I move across her room. V: endorses positive that she has intact sensation of face VII: Facial movement is symmetric with exception of upper lip asymmetry which improves with smiling VIII: hearing is intact to voice X: Uvula elevates symmetrically XI: Shoulder shrug is symmetric. XII: tongue is midline without atrophy or fasciculations.  Motor: Tone is normal. Bulk is reduced diffusely. 4/5 strength was present in UEs. 2/4 bilateral  in LEs Sensory: Sensation is symmetric to light touch and temperature in the arms and legs. Deep Tendon Reflexes: 2+ and symmetric in the biceps and patellae. Plantars: Toes are downgoing bilaterally. Cerebellar: FNF and HKS are unable to be assessed  NIHSS total 10 +2 for orientation x0 +  1 for partial gaze palsy with preference to left but able to overcome.  +4 for unable to lift legs against gravity but may be non-contributory- unknown baseline LE function +2 aphagia  +1 dysarthria   I have reviewed labs in epic and the results pertinent to this consultation are: TSH normal Lipid and Hgb A1c pending  CBC    Component Value Date/Time   WBC 6.6 03/31/2020 0109   RBC 3.28 (L) 03/31/2020 0109   HGB 7.8 (L) 03/31/2020 0109   HCT 26.5 (L) 03/31/2020 0109   PLT 143 (L) 03/31/2020 0109   MCV 80.8 03/31/2020 0109   MCH 23.8 (L) 03/31/2020 0109   MCHC 29.4 (L) 03/31/2020 0109   RDW 22.5 (H) 03/31/2020 0109   LYMPHSABS 1.1 03/31/2020 0109   MONOABS 2.6 (H) 03/31/2020 0109   EOSABS 0.0 03/31/2020 0109   BASOSABS 0.0 03/31/2020 0109    I have reviewed the images obtained: CT HEAD WO CONTRAST  Result Date: 03/30/2020 CLINICAL DATA:  Delirium/altered mental status EXAM: CT HEAD WITHOUT CONTRAST TECHNIQUE: Contiguous axial images were obtained from the base of the skull through the vertex without intravenous contrast. COMPARISON:  March 29, 2020; March 09, 2020. FINDINGS: Brain: Mild to moderate diffuse atrophy is stable. There is no intracranial mass, hemorrhage, extra-axial fluid collection, or midline shift. There is patchy decreased attenuation throughout the centra semiovale bilaterally, stable. There is evidence of a prior small infarct in the mid left cerebellum laterally, stable. No acute infarct is evident. Vascular: No appreciable hyperdense vessel. There is calcification in each carotid siphon region. Skull: Bony calvarium appears intact. Sinuses/Orbits: There is an  air-fluid level in the left sphenoid sinus. There is mucosal thickening in several ethmoid air cells. Mild mucosal thickening is noted in the inferior right maxillary antrum. Patient is status post cataract removal on the left. Orbits otherwise appear symmetric. Other: Mastoid air cells clear. 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 No acute infarct. No mass or hemorrhage. There are foci of arterial vascular calcification. There are foci of paranasal sinus disease. Note air-fluid level in the left sphenoid sinus. Electronically Signed   By: Lowella Grip III M.D.   On: 03/30/2020 10:28   CT HEAD WO CONTRAST  Result Date: 03/29/2020 CLINICAL DATA:  Altered mental status, delirium EXAM: CT HEAD WITHOUT CONTRAST TECHNIQUE: Contiguous axial images were obtained from the base of the skull through the vertex without intravenous contrast. COMPARISON:  03/09/2020 FINDINGS: Technical note: Examination is severely limited by patient motion artifact. Patient was uncooperative/combative. Study was repeated multiple times. Best possible images were submitted. Brain: No evidence of acute large territory infarct, intracranial hemorrhage, or extra-axial collection on limited view. Vascular: Limited. Skull: No evidence of depressed calvarial fracture. Sinuses/Orbits: Grossly clear. Other: None. IMPRESSION: 1. Significantly limited, essentially nondiagnostic, study secondary to patient motion artifact. A repeat study should be performed when patient is cooperative and able to tolerate scan. 2. No gross acute intracranial abnormality evident within the limitations. Electronically Signed   By: Davina Poke D.O.   On: 03/29/2020 13:14   MR BRAIN WO CONTRAST  Result Date: 03/31/2020 CLINICAL DATA:  Delirium EXAM: MRI HEAD WITHOUT CONTRAST TECHNIQUE: Multiplanar, multiecho pulse sequences of the brain and surrounding structures were obtained without  intravenous contrast. COMPARISON:  09/05/2017 FINDINGS: Brain: Multifocal bilateral abnormal diffusion restriction in a deep watershed pattern. The left hemispheres worse than the right. No acute hemorrhage. Two foci of chronic microhemorrhage in the right  temporal lobe and brainstem. Hyperintense T2-weighted signal is moderately widespread throughout the white matter. Generalized volume loss without a clear lobar predilection. Old left cerebellar infarct. The midline structures are normal. Vascular: Major flow voids are preserved. Skull and upper cervical spine: Normal calvarium and skull base. Visualized upper cervical spine and soft tissues are normal. Sinuses/Orbits:No paranasal sinus fluid levels or advanced mucosal thickening. No mastoid or middle ear effusion. Normal orbits. IMPRESSION: Multifocal acute ischemia in a deep watershed pattern, left worse than right. No hemorrhage or mass effect. Electronically Signed   By: Ulyses Jarred M.D.   On: 03/31/2020 00:57   DG Chest Portable 1 View  Result Date: 03/29/2020 CLINICAL DATA:  Altered mental status EXAM: PORTABLE CHEST 1 VIEW COMPARISON:  03/09/2020 FINDINGS: Central line tip remains in the proximal right atrium. Chronic elevation of the right hemidiaphragm. Chronic cardiomegaly and aortic atherosclerosis. No evidence of heart failure or effusion. The left lung is clear. Mild chronic volume loss at the right base related to the elevated hemidiaphragm. No acute bone finding. IMPRESSION: No acute disease. Chronic elevation of the right hemidiaphragm with mild chronic volume loss at the right base. Electronically Signed   By: Nelson Chimes M.D.   On: 03/29/2020 13:21   EEG adult  Result Date: 03/30/2020 Lora Havens, MD     03/30/2020  1:23 PM Patient Name: Joann Gutierrez MRN: [id number] Epilepsy Attending: Lora Havens Referring Physician/Provider: Dr Fayrene Helper Date: 03/30/2020 Duration: 22.42 mins Patient history: 75yo F with ams. EEG  to evaluate for seizure Level of alertness: Awake AEDs during EEG study: None Technical aspects: This EEG study was done with scalp electrodes positioned according to the 10-20 International system of electrode placement. Electrical activity was acquired at a sampling rate of 500Hz  and reviewed with a high frequency filter of 70Hz  and a low frequency filter of 1Hz . EEG data were recorded continuously and digitally stored. Description: EEG showed continuous generalized 3 to 6 Hz theta-delta slowing admixed with 15-18Hz  frontocentral beta activity. Physiologic photic driving was not seen during photic stimulation.  Hyperventilation was not performed.   ABNORMALITY -Continuous slow, generalized IMPRESSION: This study is suggestive of moderate diffuse encephalopathy, nonspecific etiology. No seizures or epileptiform discharges were seen throughout the recording. Lora Havens   Impression: 75yo female with acute onset deterioration from ischemic stroke. Likely due to hypoperfusion based on distribution of infarction in watershed areas. Likely multifactorial in nature and could be related to hypotension, hypovolemia.  Recommendations:  # Watershed distribution strokes - patient has history of CAD which would indicate a transfusion threshold of hgb <8.0. Hgb today and yesterday stable at 7.8. given the nature of watershed distribution of stroke, would consider transfusing with dialysis. No active signs of bleeding on exam.  - Stroke labs:  HgbA1c, fasting lipid panel - MRI brain completed  - MRA of the brain without contrast   - Carotid dopplers - Frequent neuro checks - Echocardiogram - Continue home ASA 81 mg daily - continue home zetia and start atorvastatin if tolerated - Risk factor modification - Telemetry monitoring - Blood pressure goal   - Normotension, AVOID hypotension. Currently, diastolic numbers in 84O - PT consult, OT consult, Speech consult  - highly recommend palliative consult and  GOC discussion with family - delirium precautions - Stroke team to follow  ESRD- TTS.  - maintain normal dialysis schedule, next session tomorrow. Managed by nephrology  HTN- avoid hypotension to prevent hypoperfusion to brain. Currently holding amlodipine, carvedilol  Severe dementia-  - consider discontinue aricept as disease is advanced  OSA-  - continue nightly CPAP  T2DM- home meds include lantus 10 and sliding scale which have not been administered this admission and CBGs have been in normal range.  - f/u hgb A1c, goal of [place], DO PGY-3 FM resident   To be seen by attending:  Donnetta Simpers, MD. [facility] [phone] Available 7 AM to 7 PM, outside these hours please contact Neurologist on call listed on AMION

## 2020-03-31 NOTE — Progress Notes (Signed)
Pt states she doesn't want to wear CPAP for the night.

## 2020-03-31 NOTE — Progress Notes (Signed)
  Echocardiogram 2D Echocardiogram has been performed.  Merrie Roof F 03/31/2020, 11:16 AM

## 2020-03-31 NOTE — Progress Notes (Addendum)
PROGRESS NOTE    Joann Gutierrez  CVE:[id number] DOB: [birth year] DOA: 03/29/2020 PCP: Andree Moro, DO   Chief Complaint  Patient presents with  . Altered Mental Status   Brief Narrative:  SKYLYNNE Gutierrez is a 75 y.o. female with medical history significant of CKD stage V recently started HD from January 2022, COPD, chronic hypoxic respite failure secondary to COPD, OSA on CPAP HS, dementia, ambulation dysfunction/bedbound, HTN, HLD, presented with altered mentation.  At baseline, patient appears to be very talkative and responding to visitors promptly, but usually only oriented to herself and place and confused about time.  However this morning, patient was found with minimal response, awake, and confused.  Husband denies patient has any pain is until last night, no diarrhea and appears to be at her baseline otherwise.  Husband reported that patient appears to have had problems with urination " very little urine compared to before January", and patient appears to be sleepy after HD sessions (TTS), usually she will sleep the rest of the day and became more awake and next day.  Last HD was yesterday.  Husband visited her 8 PM last night, and found she was sleepy which appears to be her baseline as she just had HD in the morning. Husband reported that patient had couple bouts of diarrhea last week but resolved, no diarrhea last 2 days.  ED Course: Patient was found awake, minimal response, not following command.  CT had poor quality.  Work-up showed VBG normal pH, PCO2 59, BUN 20.  Urine analysis pending.  Assessment & Plan:   Active Problems:   AMS (altered mental status)  Goals of care: has communicated to daughter that she'd want "everything" done.  Discussed having palliative care come onboard and assist [place] with further discussions.  She'd like to hold off right now, wants to discuss with brother first.   Multifocal Acute Ischemia in deep watershed pattern  Acute Metabolic  Encephalopathy  Hx Dementia Sounds like she's been declining generally since COVID infection (02/15/20) Fighting the staff yesterday morning, her daughter found her down on the ground VBG without hypercarbia, normal ammonia, TSH, vitamin B12, folate EEG with moderate diffuse encephalopathy Head CT with stable atrophy with periventricular small vessel disease, prior small infarct L cerebellum, no acute infarct, no mass or hemorrhage.  Foci of arterial vascular calcification, foci of paranasal sinus disease.   MRI brain with multifocal acute ischemia in deep watershed pattern, L>R Pending MRA brain Pending LDL, A1c Continue aspirin.  Per neurology, consider plavix (will defer to neurology). Echo with EF 02-58%, grade 1 diastolic dysfunction, LV intracavitary gradient with obstruction (see report) Carotid dopplers with 1-39% stenosis bilaterally, antegrade flow Permissive hypertension - hold home BP meds CXR without acute disease.  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 -Continue Aricept  Chronic ambulation dysfunction/bedbound -Significant deterioration of ambulation status since January 2022. -PT evaluation  OSA -CPAP HS.  DVT prophylaxis: heparin Code Status: full  Family Communication: daughter 3/11 Disposition:   Status is: Inpatient  Remains  inpatient appropriate because:Inpatient level of care appropriate due to severity of  illness   Dispo: The patient is from: SNF              Anticipated d/c is to: SNF              Patient currently is not medically stable to d/c.   Difficult to place patient No   Consultants:   renal  Procedures:  none  Antimicrobials:  Anti-infectives (From admission, onward)   None     Subjective: Disoriented, difficult to understand Denies pain, discomfort  Objective: Vitals:   03/31/20 1217 03/31/20 1351 03/31/20 1438 03/31/20 1619  BP: (!) 115/56 (!) 110/55 125/70 121/75  Pulse:  84    Resp: 14 11 18 17   Temp: 98.1 F (36.7 C)  98.5 F (36.9 C) 98.4 F (36.9 C)  TempSrc: Oral  Oral Oral  SpO2: 91%  92% 91%  Weight:      Height:        Intake/Output Summary (Last 24 hours) at 03/31/2020 1659 Last data filed at 03/30/2020 1800 Gross per 24 hour  Intake 100 ml  Output 500 ml  Net -400 ml   Filed Weights   03/30/20 1249 03/30/20 1320 03/30/20 1708  Weight: 102.7 kg 102.7 kg 102.2 kg    Examination:  General: No acute distress. Cardiovascular: Heart sounds show a regular rate, and rhythm.  Lungs: Clear to auscultation bilaterally  Abdomen: Soft, nontender, nondistended  Neurological: Alert, but disoriented. Moves all extremities 4. Cranial nerves II through XII grossly intact. Skin: Warm and dry. No rashes or lesions. Extremities: No clubbing or cyanosis. No edema.   Data Reviewed: I have personally reviewed following labs and imaging studies  CBC: Recent Labs  Lab 03/29/20 1223 03/29/20 1336 03/30/20 0538 03/31/20 0109  WBC 10.8*  --  9.3 6.6  NEUTROABS 6.6  --   --  2.7  HGB 8.4* 10.2* 7.8* 7.8*  HCT 28.9* 30.0* 25.7* 26.5*  MCV 81.4  --  79.6* 80.8  PLT 192  --  188 143*    Basic Metabolic Panel: Recent Labs  Lab 03/29/20 1223 03/29/20 1336 03/30/20 0538 03/30/20 1145 03/31/20 0109  NA 135 137 137  --  141  K 4.6 4.5 3.8  --  4.1  CL 96*  --  98  --  106  CO2 25  --  27  --  24  GLUCOSE 151*  --  115*  --  112*  BUN 20  --   26*  --  10  CREATININE 7.02*  --  8.35*  --  4.43*  CALCIUM 8.3*  --  8.5*  --  8.3*  MG  --   --   --   --  2.0  PHOS  --   --   --  5.5* 3.7    GFR: Estimated Creatinine Clearance: 13 mL/min (A) (by C-G formula based on SCr of 4.43 mg/dL (H)).  Liver Function Tests: Recent Labs  Lab 03/29/20 1223 03/31/20 0109  AST 43* 38  ALT 12 8  ALKPHOS 68 60  BILITOT 0.6 0.4  PROT 7.1 6.6  ALBUMIN 2.9* 2.6*    CBG: No results for input(s): GLUCAP in the last 168 hours.   Recent Results (from the past 240 hour(s))  SARS CORONAVIRUS 2 (TAT 6-24 HRS) Nasopharyngeal Nasopharyngeal Swab     Status: None   Collection Time: 03/30/20  1:48 AM   Specimen: Nasopharyngeal Swab  Result  Value Ref Range Status   SARS Coronavirus 2 NEGATIVE NEGATIVE Final    Comment: (NOTE) SARS-CoV-2 target nucleic acids are NOT DETECTED.  The SARS-CoV-2 RNA is generally detectable in upper and lower respiratory specimens during the acute phase of infection. Negative results do not preclude SARS-CoV-2 infection, do not rule out co-infections with other pathogens, and should not be used as the sole basis for treatment or other patient management decisions. Negative results must be combined with clinical observations, patient history, and epidemiological information. The expected result is Negative.  Fact Sheet for Patients: SugarRoll.be  Fact Sheet for Healthcare Providers: https://www.woods-mathews.com/  This test is not yet approved or cleared by the [place] FDA and  has been authorized for detection and/or diagnosis of SARS-CoV-2 by FDA under an Emergency Use Authorization (EUA). This EUA will remain  in effect (meaning this test can be used) for the duration of the COVID-19 declaration under Se ction 564(b)(1) of the Act, 21 U.S.C. section 360bbb-3(b)(1), unless the authorization is terminated or revoked sooner.  Performed at [facility], [place] [address], [place], [place] [address]          Radiology Studies: CT HEAD WO CONTRAST  Result Date: 03/30/2020 CLINICAL DATA:  Delirium/altered mental status EXAM: CT HEAD WITHOUT CONTRAST TECHNIQUE: Contiguous axial images were obtained from the base of the skull through the vertex without intravenous contrast. COMPARISON:  March 29, 2020; March 09, 2020. FINDINGS: Brain: Mild to moderate diffuse atrophy is stable. There is no intracranial mass, hemorrhage, extra-axial fluid collection, or midline shift. There is patchy decreased attenuation throughout the centra semiovale bilaterally, stable. There is evidence of a prior small infarct in the mid left cerebellum laterally, stable. No acute infarct is evident. Vascular: No appreciable hyperdense vessel. There is calcification in each carotid siphon region. Skull: Bony calvarium appears intact. Sinuses/Orbits: There is an air-fluid level in the left sphenoid sinus. There is mucosal thickening in several ethmoid air cells. Mild mucosal thickening is noted in the inferior right maxillary antrum. Patient is status post cataract removal on the left. Orbits otherwise appear symmetric. Other: Mastoid air cells clear. 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 No acute infarct. No mass or hemorrhage. There are foci of arterial vascular calcification. There are foci of paranasal sinus disease. Note air-fluid level in the left sphenoid sinus. Electronically Signed   By: Lowella Grip III M.D.   On: 03/30/2020 10:28   MR BRAIN WO CONTRAST  Result Date: 03/31/2020 CLINICAL DATA:  Delirium EXAM: MRI HEAD WITHOUT CONTRAST TECHNIQUE: Multiplanar, multiecho pulse sequences of the brain and surrounding structures were obtained without intravenous contrast. COMPARISON:  09/05/2017 FINDINGS: Brain: Multifocal bilateral abnormal diffusion restriction in a deep watershed pattern. The left  hemispheres worse than the right. No acute hemorrhage. Two foci of chronic microhemorrhage in the right temporal lobe and brainstem. Hyperintense T2-weighted signal is moderately widespread throughout the white matter. Generalized volume loss without a clear lobar predilection. Old left cerebellar infarct. The midline structures are normal. Vascular: Major flow voids are preserved. Skull and upper cervical spine: Normal calvarium and skull base. Visualized upper cervical spine and soft tissues are normal. Sinuses/Orbits:No paranasal sinus fluid levels or advanced mucosal thickening. No mastoid or middle ear effusion. Normal orbits. IMPRESSION: Multifocal acute ischemia in a deep watershed pattern, left worse than right. No hemorrhage or mass effect. Electronically Signed   By: Ulyses Jarred M.D.   On: 03/31/2020 00:57  EEG adult  Result Date: 03/30/2020 Lora Havens, MD     03/30/2020  1:23 PM Patient Name: DENEE BOEDER MRN: [id number] Epilepsy Attending: Lora Havens Referring Physician/Provider: Dr Fayrene Helper Date: 03/30/2020 Duration: 22.42 mins Patient history: 75yo F with ams. EEG to evaluate for seizure Level of alertness: Awake AEDs during EEG study: None Technical aspects: This EEG study was done with scalp electrodes positioned according to the 10-20 International system of electrode placement. Electrical activity was acquired at a sampling rate of 500Hz  and reviewed with a high frequency filter of 70Hz  and a low frequency filter of 1Hz . EEG data were recorded continuously and digitally stored. Description: EEG showed continuous generalized 3 to 6 Hz theta-delta slowing admixed with 15-18Hz  frontocentral beta activity. Physiologic photic driving was not seen during photic stimulation.  Hyperventilation was not performed.   ABNORMALITY -Continuous slow, generalized IMPRESSION: This study is suggestive of moderate diffuse encephalopathy, nonspecific etiology. No seizures or epileptiform  discharges were seen throughout the recording. Lora Havens   ECHOCARDIOGRAM COMPLETE  Result Date: 03/31/2020    ECHOCARDIOGRAM REPORT   Patient Name:   [name] Date of Exam: 03/31/2020 Medical Rec #:  [id number]           Height:       64.0 in Accession #:    [id number]          Weight:       225.3 lb Date of Birth:  [birth year]           BSA:          2.058 m Patient Age:    86 years            BP:           116/61 mmHg Patient Gender: F                   HR:           82 bpm. Exam Location:  Inpatient Procedure: 2D Echo, Cardiac Doppler and Color Doppler Indications:    Stroke  History:        Patient has prior history of Echocardiogram examinations, most                 recent 09/08/2017.  Sonographer:    Merrie Roof RDCS Referring Phys: [phone] A CALDWELL POWELL JR IMPRESSIONS  1. LV intracavitary gradient with obstruction, max instantaneous gradient rest: 47 mmHg, Valsalva: 59 mmHg.  2. Left ventricular ejection fraction, by estimation, is 70 to 75%. The left ventricle has hyperdynamic function. The left ventricle has no regional wall motion abnormalities. There is moderate left ventricular hypertrophy. Left ventricular diastolic parameters are consistent with Grade I diastolic dysfunction (impaired relaxation).  3. Right ventricular systolic function is normal. The right ventricular size is normal. There is moderately elevated pulmonary artery systolic pressure. The estimated right ventricular systolic pressure is 48.5 mmHg.  4. The mitral valve is normal in structure. No evidence of mitral valve regurgitation. No evidence of mitral stenosis.  5. The aortic valve is grossly normal. Aortic valve regurgitation is not visualized. No aortic stenosis is present.  6. The inferior vena cava is normal in size with <50% respiratory variability, suggesting right atrial pressure of 8 mmHg. Conclusion(s)/Recommendation(s): No intracardiac source of embolism detected on this transthoracic study. A  transesophageal echocardiogram can be considered to exclude cardiac source of embolism if clinically indicated. FINDINGS  Left Ventricle: Left ventricular ejection fraction, by estimation,  is 70 to 75%. The left ventricle has hyperdynamic function. The left ventricle has no regional wall motion abnormalities. The left ventricular internal cavity size was small. There is moderate left ventricular hypertrophy. Left ventricular diastolic parameters are consistent with Grade I diastolic dysfunction (impaired relaxation). Right Ventricle: The right ventricular size is normal. No increase in right ventricular wall thickness. Right ventricular systolic function is normal. There is moderately elevated pulmonary artery systolic pressure. The tricuspid regurgitant velocity is 3.12 m/s, and with an assumed right atrial pressure of 8 mmHg, the estimated right ventricular systolic pressure is 73.7 mmHg. Left Atrium: Left atrial size was normal in size. Right Atrium: Right atrial size was normal in size. Pericardium: There is no evidence of pericardial effusion. Mitral Valve: The mitral valve is normal in structure. No evidence of mitral valve regurgitation. No evidence of mitral valve stenosis. Tricuspid Valve: The tricuspid valve is normal in structure. Tricuspid valve regurgitation is mild . No evidence of tricuspid stenosis. Aortic Valve: The aortic valve is grossly normal. Aortic valve regurgitation is not visualized. No aortic stenosis is present. Pulmonic Valve: The pulmonic valve was not well visualized. Pulmonic valve regurgitation is trivial. No evidence of pulmonic stenosis. Aorta: The aortic root is normal in size and structure. Venous: The inferior vena cava is normal in size with less than 50% respiratory variability, suggesting right atrial pressure of 8 mmHg. IAS/Shunts: No atrial level shunt detected by color flow Doppler.  LEFT VENTRICLE PLAX 2D LVIDd:         3.20 cm  Diastology LVIDs:         1.90 cm  LV e'  medial:    5.33 cm/s LV PW:         1.30 cm  LV E/e' medial:  11.3 LV IVS:        1.30 cm  LV e' lateral:   7.72 cm/s LVOT diam:     1.90 cm  LV E/e' lateral: 7.8 LV SV:         65 LV SV Index:   32 LVOT Area:     2.84 cm  RIGHT VENTRICLE          IVC RV Basal diam:  3.70 cm  IVC diam: 1.80 cm LEFT ATRIUM             Index       RIGHT ATRIUM           Index LA diam:        3.30 cm 1.60 cm/m  RA Area:     18.20 cm LA Vol (A2C):   79.9 ml 38.82 ml/m RA Volume:   50.20 ml  24.39 ml/m LA Vol (A4C):   51.1 ml 24.83 ml/m LA Biplane Vol: 67.0 ml 32.56 ml/m  AORTIC VALVE LVOT Vmax:   112.00 cm/s LVOT VTI:    0.230 m  AORTA Ao Root diam: 3.20 cm MITRAL VALVE               TRICUSPID VALVE MV Area (PHT): 2.87 cm    TR Peak grad:   38.9 mmHg MV Decel Time: 264 msec    TR Vmax:        312.00 cm/s MV E velocity: 60.00 cm/s MV A velocity: 96.10 cm/s  SHUNTS MV E/A ratio:  0.62        Systemic VTI:  0.23 m  Systemic Diam: 1.90 cm Cherlynn Kaiser MD Electronically signed by Cherlynn Kaiser MD Signature Date/Time: 03/31/2020/2:08:56 PM    Final    VAS US CAROTID (at [facility] and [facility] only)  Result Date: 03/31/2020 Carotid Arterial Duplex Study Indications:      CVA. Risk Factors:     Hypertension, hyperlipidemia, Diabetes, coronary artery                   disease. Comparison Study: previous 09/06/17 Performing Technologist: Abram Sander RVS  Examination Guidelines: A complete evaluation includes B-mode imaging, spectral Doppler, color Doppler, and power Doppler as needed of all accessible portions of each vessel. Bilateral testing is considered an integral part of a complete examination. Limited examinations for reoccurring indications may be performed as noted.  Right Carotid Findings: +----------+--------+--------+--------+------------------+--------+           PSV cm/sEDV cm/sStenosisPlaque DescriptionComments +----------+--------+--------+--------+------------------+--------+ CCA Prox  193      11              heterogenous               +----------+--------+--------+--------+------------------+--------+ CCA Distal100     12              heterogenous               +----------+--------+--------+--------+------------------+--------+ ICA Prox  69      17      1-39%   heterogenous               +----------+--------+--------+--------+------------------+--------+ ICA Distal58      16                                         +----------+--------+--------+--------+------------------+--------+ ECA       167                                       tortuous +----------+--------+--------+--------+------------------+--------+ +----------+--------+-------+--------------+-------------------+           PSV cm/sEDV cmsDescribe      Arm Pressure (mmHG) +----------+--------+-------+--------------+-------------------+ Subclavian               Not identified                    +----------+--------+-------+--------------+-------------------+ +---------+--------+--+--------+--+---------+ VertebralPSV cm/s52EDV cm/s12Antegrade +---------+--------+--+--------+--+---------+  Left Carotid Findings: +----------+--------+--------+--------+------------------+--------+           PSV cm/sEDV cm/sStenosisPlaque DescriptionComments +----------+--------+--------+--------+------------------+--------+ CCA Prox  140     18              heterogenous               +----------+--------+--------+--------+------------------+--------+ CCA Distal75      12              heterogenous               +----------+--------+--------+--------+------------------+--------+ ICA Prox  75      15      1-39%   heterogenous               +----------+--------+--------+--------+------------------+--------+ ICA Distal72      21                                         +----------+--------+--------+--------+------------------+--------+  ECA       103                                        tortuous +----------+--------+--------+--------+------------------+--------+ +----------+--------+--------+--------+-------------------+           PSV cm/sEDV cm/sDescribeArm Pressure (mmHG) +----------+--------+--------+--------+-------------------+ [id number]                                         +----------+--------+--------+--------+-------------------+ +---------+--------+--+--------+--+---------+ VertebralPSV cm/s47EDV cm/s11Antegrade +---------+--------+--+--------+--+---------+   Summary: Right Carotid: Velocities in the right ICA are consistent with a 1-39% stenosis. Left Carotid: Velocities in the left ICA are consistent with a 1-39% stenosis. Vertebrals: Bilateral vertebral arteries demonstrate antegrade flow. *See table(s) above for measurements and observations.     Preliminary         Scheduled Meds: . ascorbic acid  500 mg Oral Daily  . aspirin EC  81 mg Oral Daily  . calcium carbonate  1,250 mg Oral Daily  . Chlorhexidine Gluconate Cloth  6 each Topical Q0600  . donepezil  5 mg Oral QHS  . [START ON 04/01/2020] doxercalciferol  3 mcg Intravenous Q T,Th,Sa-HD  . ezetimibe  10 mg Oral Daily  . fluticasone furoate-vilanterol  1 puff Inhalation Daily  . heparin  5,000 Units Subcutaneous Q12H  . montelukast  10 mg Oral QHS  . pantoprazole  40 mg Oral Daily  . umeclidinium bromide  1 puff Inhalation Daily   Continuous Infusions: . iron sucrose Stopped (03/31/20 0543)     LOS: 2 days    Time spent: over 65 min    Fayrene Helper, MD [facility]   To contact the attending provider between 7A-7P or the covering provider during after hours 7P-7A, please log into the web site [email or web address] and access using universal [place] password for that web site. If you do not have the password, please call the hospital operator.  03/31/2020, 4:59 PM

## 2020-03-31 NOTE — Progress Notes (Signed)
Carotid duplex has been completed.   Preliminary results in CV Proc.   Abram Sander 03/31/2020 10:44 AM

## 2020-03-31 NOTE — Progress Notes (Signed)
CSW following for discharge needs. Patient has been at [facility] for rehab and is able to return if needed (no new COVID test required) .   Makailah Slavick LCSW

## 2020-03-31 NOTE — Progress Notes (Signed)
[facility] Progress Note   Subjective:  Completed dialysis yesterday -minimal UF Seen in room. Oriented to self, having trouble finding words    Objective Vitals:   03/30/20 2040 03/30/20 2242 03/31/20 0444 03/31/20 1003  BP: (!) 122/51  (!) 110/58 116/61  Pulse: 89 86 80   Resp: 17 16 16 15   Temp: 98.6 F (37 C)  98.1 F (36.7 C) 98.2 F (36.8 C)  TempSrc: Oral  Axillary Oral  SpO2: 95% 95% 90% 92%  Weight:      Height:         Additional Objective Labs: Basic Metabolic Panel: Recent Labs  Lab 03/29/20 1223 03/29/20 1336 03/30/20 0538 03/30/20 1145 03/31/20 0109  NA 135 137 137  --  141  K 4.6 4.5 3.8  --  4.1  CL 96*  --  98  --  106  CO2 25  --  27  --  24  GLUCOSE 151*  --  115*  --  112*  BUN 20  --  26*  --  10  CREATININE 7.02*  --  8.35*  --  4.43*  CALCIUM 8.3*  --  8.5*  --  8.3*  PHOS  --   --   --  5.5* 3.7   CBC: Recent Labs  Lab 03/29/20 1223 03/29/20 1336 03/30/20 0538 03/31/20 0109  WBC 10.8*  --  9.3 6.6  NEUTROABS 6.6  --   --  2.7  HGB 8.4* 10.2* 7.8* 7.8*  HCT 28.9* 30.0* 25.7* 26.5*  MCV 81.4  --  79.6* 80.8  PLT 192  --  188 143*   Blood Culture    Component Value Date/Time   SDES IN/OUT CATH URINE 02/16/2020 1119   SPECREQUEST  02/16/2020 1119    NONE Performed at [facility] [address], [place], [place] [address]    CULT 20,000 COLONIES/mL ESCHERICHIA COLI (A) 02/16/2020 1119   REPTSTATUS 02/18/2020 FINAL 02/16/2020 1119     Physical Exam General: Elderly female, nad  Heart: RRR  No m,r,g  Lungs: Clear bilaterally  Abdomen: soft ntnd  Extremities: No sig LE edema  Dialysis Access: R IJ TDC   Medications: . iron sucrose Stopped (03/31/20 0543)   . ascorbic acid  500 mg Oral Daily  . aspirin EC  81 mg Oral Daily  . calcium carbonate  1,250 mg Oral Daily  . Chlorhexidine Gluconate Cloth  6 each Topical Q0600  . donepezil  5 mg Oral QHS  . [START ON 04/01/2020] doxercalciferol  3  mcg Intravenous Q T,Th,Sa-HD  . ezetimibe  10 mg Oral Daily  . fluticasone furoate-vilanterol  1 puff Inhalation Daily  . heparin  5,000 Units Subcutaneous Q12H  . montelukast  10 mg Oral QHS  . pantoprazole  40 mg Oral Daily  . umeclidinium bromide  1 puff Inhalation Daily    Dialysis Orders:   [place] TTS  3.5h  400/800   107kg  2/2 bath  RIJ TDC Hep none  - hect 3ug tiw  - mircera 225 q2 , last 3/3  - venofer 50 mg per wk  Assessment/Plan: 1. AMS/Watershed distribution stroke -  pt w/ underlying dementia.  MRI brain with multifocal acute ischemia in watershed pattern.  W/u per neuro/primary. Neurontin dc'd.   2. ESRD - recent start to HD in Jan 2022. HD TTS. Next HD 3/12.  3. Recent COVID Pna admission 4. Dementia - advanced at baseline 5. HTN - cont meds as  needed 6. Chronic debility- pt is bedbound 7. OSA 8. Anemia ckd - next esa due on 3/17.   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 Cont VDRA    Lynnda Child PA-C [facility] 03/31/2020,11:39 AM

## 2020-04-01 ENCOUNTER — Inpatient Hospital Stay (HOSPITAL_COMMUNITY): Payer: Medicare HMO

## 2020-04-01 DIAGNOSIS — I6389 Other cerebral infarction: Secondary | ICD-10-CM | POA: Diagnosis not present

## 2020-04-01 LAB — GLUCOSE, CAPILLARY
Glucose-Capillary: 74 mg/dL (ref 70–99)
Glucose-Capillary: 72 mg/dL (ref 70–99)

## 2020-04-01 LAB — CBC
HCT: 25.6 % — ABNORMAL LOW (ref 36.0–46.0)
Hemoglobin: 7.4 g/dL — ABNORMAL LOW (ref 12.0–15.0)
MCH: 23.3 pg — ABNORMAL LOW (ref 26.0–34.0)
MCHC: 28.9 g/dL — ABNORMAL LOW (ref 30.0–36.0)
MCV: 80.8 fL (ref 80.0–100.0)
Platelets: 158 10*3/uL (ref 150–400)
RBC: 3.17 MIL/uL — ABNORMAL LOW (ref 3.87–5.11)
RDW: 22.3 % — ABNORMAL HIGH (ref 11.5–15.5)
WBC: 5.5 10*3/uL (ref 4.0–10.5)
nRBC: 0.4 % — ABNORMAL HIGH (ref 0.0–0.2)

## 2020-04-01 LAB — RENAL FUNCTION PANEL
Albumin: 2.6 g/dL — ABNORMAL LOW (ref 3.5–5.0)
Anion gap: 11 (ref 5–15)
BUN: 23 mg/dL (ref 8–23)
CO2: 27 mmol/L (ref 22–32)
Calcium: 8.1 mg/dL — ABNORMAL LOW (ref 8.9–10.3)
Chloride: 106 mmol/L (ref 98–111)
Creatinine, Ser: 7.07 mg/dL — ABNORMAL HIGH (ref 0.44–1.00)
GFR, Estimated: 6 mL/min — ABNORMAL LOW (ref >60)
Glucose, Bld: 85 mg/dL (ref 70–99)
Phosphorus: 4.7 mg/dL — ABNORMAL HIGH (ref 2.5–4.6)
Potassium: 3.6 mmol/L (ref 3.5–5.1)
Sodium: 144 mmol/L (ref 135–145)

## 2020-04-01 MED ORDER — CLOPIDOGREL BISULFATE 75 MG PO TABS
75.0000 mg | ORAL_TABLET | Freq: Every day | ORAL | Status: DC
  Administered 2020-04-02 – 2020-04-05 (×3): 75 mg via ORAL
  Filled 2020-04-01 (×6): qty 1

## 2020-04-01 MED ORDER — LIDOCAINE HCL (PF) 1 % IJ SOLN: 5.0000 mL | INTRAMUSCULAR | Status: DC | PRN

## 2020-04-01 MED ORDER — HEPARIN SODIUM (PORCINE) 1000 UNIT/ML DIALYSIS: 1000.0000 [IU] | INTRAMUSCULAR | Status: DC | PRN

## 2020-04-01 MED ORDER — ALTEPLASE 2 MG IJ SOLR: 2.0000 mg | Freq: Once | INTRAMUSCULAR | Status: DC | PRN

## 2020-04-01 MED ORDER — PENTAFLUOROPROP-TETRAFLUOROETH EX AERO: 1.0000 "application " | INHALATION_SPRAY | CUTANEOUS | Status: DC | PRN

## 2020-04-01 MED ORDER — DOXERCALCIFEROL 4 MCG/2ML IV SOLN
INTRAVENOUS | Status: AC
  Filled 2020-04-01: qty 2

## 2020-04-01 MED ORDER — SODIUM CHLORIDE 0.9 % IV SOLN: 100.0000 mL | INTRAVENOUS | Status: DC | PRN

## 2020-04-01 MED ORDER — HEPARIN SODIUM (PORCINE) 1000 UNIT/ML IJ SOLN
INTRAMUSCULAR | Status: AC
  Filled 2020-04-01: qty 4

## 2020-04-01 MED ORDER — LORAZEPAM 2 MG/ML IJ SOLN
0.5000 mg | Freq: Once | INTRAMUSCULAR | Status: DC
  Filled 2020-04-01 (×2): qty 1

## 2020-04-01 MED ORDER — LIDOCAINE-PRILOCAINE 2.5-2.5 % EX CREA: 1.0000 "application " | TOPICAL_CREAM | CUTANEOUS | Status: DC | PRN

## 2020-04-01 MED ORDER — ALTEPLASE 2 MG IJ SOLR
2.0000 mg | Freq: Once | INTRAMUSCULAR | Status: DC | PRN
Start: 2020-04-02 — End: 2020-04-01

## 2020-04-01 NOTE — Progress Notes (Addendum)
STROKE TEAM PROGRESS NOTE   INTERVAL HISTORY She was receiving dialysis during interview. She was able to focus on person asking questions and was able to understand some of them and attempted to answer some of the questions but otherwise could give no history. Difficult to tell how significant of a change she is showing due to her baseline dementia.  Per chart review about patients baseline- At baseline, patient oriented to self and place but unable to complete most ADLs independently including needing intermittent feeding assistance. 3/9 was found to be non-verbal and acting combative which is atypical for her.  Will obtain MRA. Hospitalists are primary, nephrology is following for dialysis. Plan is to discharge back to her SNF once medically stable.   Vitals:   03/31/20 1940 04/01/20 0444 04/01/20 0755 04/01/20 0800  BP: 121/74 127/65 139/67 (!) 150/60  Pulse: 80 69    Resp: 15 20 17 18   Temp: 99.8 F (37.7 C) 99 F (37.2 C)    TempSrc: Axillary Axillary    SpO2: 95% 90%    Weight:      Height:       CBC:  Recent Labs  Lab 03/29/20 1223 03/29/20 1336 03/30/20 0538 03/31/20 0109  WBC 10.8*  --  9.3 6.6  NEUTROABS 6.6  --   --  2.7  HGB 8.4*   < > 7.8* 7.8*  HCT 28.9*   < > 25.7* 26.5*  MCV 81.4  --  79.6* 80.8  PLT 192  --  188 143*   < > = values in this interval not displayed.   Basic Metabolic Panel:  Recent Labs  Lab 03/30/20 0538 03/30/20 1145 03/31/20 0109  NA 137  --  141  K 3.8  --  4.1  CL 98  --  106  CO2 27  --  24  GLUCOSE 115*  --  112*  BUN 26*  --  10  CREATININE 8.35*  --  4.43*  CALCIUM 8.5*  --  8.3*  MG  --   --  2.0  PHOS  --  5.5* 3.7   Lipid Panel: No results for input(s): CHOL, TRIG, HDL, CHOLHDL, VLDL, LDLCALC in the last 168 hours. HgbA1c: No results for input(s): HGBA1C in the last 168 hours. Urine Drug Screen: No results for input(s): LABOPIA, COCAINSCRNUR, LABBENZ, AMPHETMU, THCU, LABBARB in the last 168 hours.  Alcohol Level No  results for input(s): ETH in the last 168 hours.  IMAGING past 24 hours ECHOCARDIOGRAM COMPLETE  Result Date: 03/31/2020    ECHOCARDIOGRAM REPORT   Patient Name:   Joann Gutierrez Date of Exam: 03/31/2020 Medical Rec #:  [id number]           Height:       64.0 in Accession #:    [id number]          Weight:       225.3 lb Date of Birth:  16-Oct-1945           BSA:          2.058 m Patient Age:    75 years            BP:           116/61 mmHg Patient Gender: F                   HR:           82 bpm. Exam Location:  Inpatient Procedure: 2D Echo, Cardiac Doppler  and Color Doppler Indications:    Stroke  History:        Patient has prior history of Echocardiogram examinations, most                 recent 09/08/2017.  Sonographer:    Merrie Roof RDCS Referring Phys: [phone] A CALDWELL POWELL JR IMPRESSIONS  1. LV intracavitary gradient with obstruction, max instantaneous gradient rest: 47 mmHg, Valsalva: 59 mmHg.  2. Left ventricular ejection fraction, by estimation, is 70 to 75%. The left ventricle has hyperdynamic function. The left ventricle has no regional wall motion abnormalities. There is moderate left ventricular hypertrophy. Left ventricular diastolic parameters are consistent with Grade I diastolic dysfunction (impaired relaxation).  3. Right ventricular systolic function is normal. The right ventricular size is normal. There is moderately elevated pulmonary artery systolic pressure. The estimated right ventricular systolic pressure is 29.9 mmHg.  4. The mitral valve is normal in structure. No evidence of mitral valve regurgitation. No evidence of mitral stenosis.  5. The aortic valve is grossly normal. Aortic valve regurgitation is not visualized. No aortic stenosis is present.  6. The inferior vena cava is normal in size with <50% respiratory variability, suggesting right atrial pressure of 8 mmHg. Conclusion(s)/Recommendation(s): No intracardiac source of embolism detected on this transthoracic study. A  transesophageal echocardiogram can be considered to exclude cardiac source of embolism if clinically indicated. FINDINGS  Left Ventricle: Left ventricular ejection fraction, by estimation, is 70 to 75%. The left ventricle has hyperdynamic function. The left ventricle has no regional wall motion abnormalities. The left ventricular internal cavity size was small. There is moderate left ventricular hypertrophy. Left ventricular diastolic parameters are consistent with Grade I diastolic dysfunction (impaired relaxation). Right Ventricle: The right ventricular size is normal. No increase in right ventricular wall thickness. Right ventricular systolic function is normal. There is moderately elevated pulmonary artery systolic pressure. The tricuspid regurgitant velocity is 3.12 m/s, and with an assumed right atrial pressure of 8 mmHg, the estimated right ventricular systolic pressure is 24.2 mmHg. Left Atrium: Left atrial size was normal in size. Right Atrium: Right atrial size was normal in size. Pericardium: There is no evidence of pericardial effusion. Mitral Valve: The mitral valve is normal in structure. No evidence of mitral valve regurgitation. No evidence of mitral valve stenosis. Tricuspid Valve: The tricuspid valve is normal in structure. Tricuspid valve regurgitation is mild . No evidence of tricuspid stenosis. Aortic Valve: The aortic valve is grossly normal. Aortic valve regurgitation is not visualized. No aortic stenosis is present. Pulmonic Valve: The pulmonic valve was not well visualized. Pulmonic valve regurgitation is trivial. No evidence of pulmonic stenosis. Aorta: The aortic root is normal in size and structure. Venous: The inferior vena cava is normal in size with less than 50% respiratory variability, suggesting right atrial pressure of 8 mmHg. IAS/Shunts: No atrial level shunt detected by color flow Doppler.  LEFT VENTRICLE PLAX 2D LVIDd:         3.20 cm  Diastology LVIDs:         1.90 cm  LV e'  medial:    5.33 cm/s LV PW:         1.30 cm  LV E/e' medial:  11.3 LV IVS:        1.30 cm  LV e' lateral:   7.72 cm/s LVOT diam:     1.90 cm  LV E/e' lateral: 7.8 LV SV:         65 LV SV Index:  32 LVOT Area:     2.84 cm  RIGHT VENTRICLE          IVC RV Basal diam:  3.70 cm  IVC diam: 1.80 cm LEFT ATRIUM             Index       RIGHT ATRIUM           Index LA diam:        3.30 cm 1.60 cm/m  RA Area:     18.20 cm LA Vol (A2C):   79.9 ml 38.82 ml/m RA Volume:   50.20 ml  24.39 ml/m LA Vol (A4C):   51.1 ml 24.83 ml/m LA Biplane Vol: 67.0 ml 32.56 ml/m  AORTIC VALVE LVOT Vmax:   112.00 cm/s LVOT VTI:    0.230 m  AORTA Ao Root diam: 3.20 cm MITRAL VALVE               TRICUSPID VALVE MV Area (PHT): 2.87 cm    TR Peak grad:   38.9 mmHg MV Decel Time: 264 msec    TR Vmax:        312.00 cm/s MV E velocity: 60.00 cm/s MV A velocity: 96.10 cm/s  SHUNTS MV E/A ratio:  0.62        Systemic VTI:  0.23 m                            Systemic Diam: 1.90 cm Cherlynn Kaiser MD Electronically signed by Cherlynn Kaiser MD Signature Date/Time: 03/31/2020/2:08:56 PM    Final    VAS US CAROTID (at [facility] and [facility] only)  Result Date: 03/31/2020 Carotid Arterial Duplex Study Indications:      CVA. Risk Factors:     Hypertension, hyperlipidemia, Diabetes, coronary artery                   disease. Comparison Study: previous 09/06/17 Performing Technologist: Abram Sander RVS  Examination Guidelines: A complete evaluation includes B-mode imaging, spectral Doppler, color Doppler, and power Doppler as needed of all accessible portions of each vessel. Bilateral testing is considered an integral part of a complete examination. Limited examinations for reoccurring indications may be performed as noted.  Right Carotid Findings: +----------+--------+--------+--------+------------------+--------+           PSV cm/sEDV cm/sStenosisPlaque DescriptionComments +----------+--------+--------+--------+------------------+--------+ CCA Prox  193      11              heterogenous               +----------+--------+--------+--------+------------------+--------+ CCA Distal100     12              heterogenous               +----------+--------+--------+--------+------------------+--------+ ICA Prox  69      17      1-39%   heterogenous               +----------+--------+--------+--------+------------------+--------+ ICA Distal58      16                                         +----------+--------+--------+--------+------------------+--------+ ECA       167  tortuous +----------+--------+--------+--------+------------------+--------+ +----------+--------+-------+--------------+-------------------+           PSV cm/sEDV cmsDescribe      Arm Pressure (mmHG) +----------+--------+-------+--------------+-------------------+ Subclavian               Not identified                    +----------+--------+-------+--------------+-------------------+ +---------+--------+--+--------+--+---------+ VertebralPSV cm/s52EDV cm/s12Antegrade +---------+--------+--+--------+--+---------+  Left Carotid Findings: +----------+--------+--------+--------+------------------+--------+           PSV cm/sEDV cm/sStenosisPlaque DescriptionComments +----------+--------+--------+--------+------------------+--------+ CCA Prox  140     18              heterogenous               +----------+--------+--------+--------+------------------+--------+ CCA Distal75      12              heterogenous               +----------+--------+--------+--------+------------------+--------+ ICA Prox  75      15      1-39%   heterogenous               +----------+--------+--------+--------+------------------+--------+ ICA Distal72      21                                         +----------+--------+--------+--------+------------------+--------+ ECA       103                                        tortuous +----------+--------+--------+--------+------------------+--------+ +----------+--------+--------+--------+-------------------+           PSV cm/sEDV cm/sDescribeArm Pressure (mmHG) +----------+--------+--------+--------+-------------------+ [id number]                                         +----------+--------+--------+--------+-------------------+ +---------+--------+--+--------+--+---------+ VertebralPSV cm/s47EDV cm/s11Antegrade +---------+--------+--+--------+--+---------+   Summary: Right Carotid: Velocities in the right ICA are consistent with a 1-39% stenosis. Left Carotid: Velocities in the left ICA are consistent with a 1-39% stenosis. Vertebrals: Bilateral vertebral arteries demonstrate antegrade flow. *See table(s) above for measurements and observations.     Preliminary     PHYSICAL EXAM Blood pressure (!) 150/60, pulse 69, temperature 99 F (37.2 C), temperature source Axillary, resp. rate 18, height 5\' 4"  (1.626 m), weight 102.2 kg, SpO2 90 %.  General: alert and awake, frail elderly african [place] female, no apparent distress  Lungs: Symmetrical Chest rise, no labored breathing  Cardio: Regular Rate and Rhythm  Abdomen: Soft, non-tender  Neuro: Alert not oriented, thought content appropriate.  Speech fluent without evidence of aphasia only follows occasional one step command.  Speech rambling at times.  Easy distractibility.  Poor attention, registration and recall. Able to focus on speaker, no facial droop noticed  Motor: Able to move all limbs against gravity with brisk withdrawal from noxious stimuli but not cooperative for detailed muscle testing.    ASSESSMENT/PLAN Joann Gutierrez is a 75 y.o. female with history of dementia; COPD on 2L home O2; CAD; ESRD on dialysis; OSA on CPAP; chronic diastolic CHF; DM; HTN; HLD; and obesity presenting with worsening of mental status on baseline advanced dementia. At baseline, patient  oriented to self and place but unable to complete most ADLs independently  including needing intermittent feeding assistance. 3/9 was found to be non-verbal and acting combative which is atypical for her.    It is difficult to distinguish her current state from baseline. Based on chart review it does seem that she is worse as she was not oriented to self or place today and was non-verbal and combative which is not her baseline. Will obtain MR Angio to determine extent of damage from watershed strokes.    Stroke: Multifocal bilateral deep white matter infarcts left worse than Right in a pseudowatershed distribution likely related to severe small vessel disease uncontrolled risk factors of hypertension , diabetes and hyperlipidemia  CT head - Stable atrophy with periventricular small vessel disease. Prior small infarct left cerebellum. No acute infarct. No mass or hemorrhage.  MRI - Multifocal acute ischemia in a deep watershed pattern, left worse than right. No hemorrhage or mass effect.  MRA  - Ordered  Carotid Doppler - Left and Right ICA consistent with 1-39% stenosis.  2D Echo- EF:70-75%. Left ventricle has hyperdynamic function. No regional wall motion abnormalities.  LDL 166  HgbA1c 7.4  VTE prophylaxis - Heparin    Diet   Diet renal with fluid restriction Fluid restriction: 1200 mL Fluid; Room service appropriate? Yes; Fluid consistency: Thin     aspirin 81 mg daily prior to admission, now on aspirin 81 mg daily and Plavix 75 mg daily for 3 weeks followed by Plavix alone.   Therapy recommendations:  SNF  Disposition:  Return to SNF when medically stable  Hypertension  Home meds:  Amlodipine   Stable . Long-term BP goal normotensive  Hyperlipidemia  Home meds:  Ezetimibe, resumed in hospital  LDL 166, goal < 70  Add Atorvastatin   High intensity statin Atorvastatin 40 mg  Continue statin at discharge  Diabetes type II Uncontrolled  Home meds:  Aspart,  Glargine,  HgbA1c 7.4, goal < 7.0  CBGs Recent Labs    03/31/20 1752 03/31/20 2102  GLUCAP 99 104*      SSI  Other Stroke Risk Factors  Advanced Age >/= 90   Former Cigarette smoker  Obesity, Body mass index is 38.67 kg/m., BMI >/= 30 associated with increased stroke risk, recommend weight loss, diet and exercise as appropriate   Coronary artery disease  Obstructive sleep apnea, uses CPAP at home  Congestive heart failure  Other Active Problems  ESRD - On dialysis  Severe Dementia  OSA  Hospital day # 3  Fatima Sanger MD Resident  I have personally obtained history,examined this patient, reviewed notes, independently viewed imaging studies, participated in medical decision making and plan of care.ROS completed by me personally and pertinent positives fully documented  I have made any additions or clarifications directly to the above note. Agree with note above.  She has baseline dementia but presented with worsening of speech and confusion and MRI scan shows multiple small subcortical lacunar strokes and a pseudowatershed distribution.  There are multiple uncontrolled risk factors of hypertension, diabetes and hyperlipidemia which could be contributing.  Recommend aspirin and Plavix for 3 weeks followed by Plavix alone and aggressive risk factor modification.  Discussed with Dr. Florene Glen.  Greater than 50% time during the 35-minute visit was spent in counseling and coordination of care about multiple strokes and with care team.  Stroke team will sign off.  Kindly call for questions.  Antony Contras, MD Medical Director [place] Pager: [phone] 04/01/2020 2:32 PM   To contact Stroke Continuity provider, please refer to  [email or web address] After hours, contact General Neurology

## 2020-04-01 NOTE — TOC Initial Note (Signed)
Transition of Care [facility]) - Initial/Assessment Note    Patient Details  Name: Joann Gutierrez MRN: [id number] Date of Birth: 10-19-1945  Transition of Care [facility]) CM/SW Contact:    Geralynn Ochs, [place] Phone Number: 04/01/2020, 12:37 PM  Clinical Narrative:       CSW spoke with patient's son, [name], to confirm that the plan is for patient to return to [facility] when medically stable. Patient has been at [facility] for rehabilitation, will need insurance authorization prior to returning. CSW to continue to follow.            Expected Discharge Plan: Skilled Nursing Facility Barriers to Discharge: Continued Medical Work up,Insurance Authorization   Patient Goals and CMS Choice Patient states their goals for this hospitalization and ongoing recovery are:: patient unable to participate in goal setting, disoriented CMS Medicare.gov Compare Post Acute Care list provided to:: Patient Represenative (must comment) Choice offered to / list presented to : Adult Children  Expected Discharge Plan and Services Expected Discharge Plan: [place] Choice: [place] Living arrangements for the past 2 months: [place]                                      Prior Living Arrangements/Services Living arrangements for the past 2 months: [place] Lives with:: Significant Other Patient language and need for interpreter reviewed:: No Do you feel safe going back to the place where you live?: Yes      Need for Family Participation in Patient Care: Yes (Comment) Care giver support system in place?: Yes (comment)   Criminal Activity/Legal Involvement Pertinent to Current Situation/Hospitalization: No - Comment as needed  Activities of Daily Living      Permission Sought/Granted Permission sought to share information with : Facility Contact Representative,Family Supports Permission granted to share  information with : Yes, Verbal Permission Granted  Share Information with NAME: Ron Agee  Permission granted to share info w AGENCY: [place] granted to share info w Relationship: Son, Daughter     Emotional Assessment   Attitude/Demeanor/Rapport: Unable to Assess Affect (typically observed): Unable to Assess   Alcohol / Substance Use: Not Applicable Psych Involvement: No (comment)  Admission diagnosis:  Delirium [R41.0] AMS (altered mental status) [R41.82] Patient Active Problem List   Diagnosis Date Noted  . Acute bilat watershed infarction [facility] 03/31/2020  . AMS (altered mental status) 03/29/2020  . Acute metabolic encephalopathy 96/29/5284  . ESRD on hemodialysis ([place]) 03/09/2020  . Thrombocytopenia ([place]) 03/09/2020  . Dementia with behavioral disturbance ([place]) 03/09/2020  . COVID-19   . SOB (shortness of breath)   . Type 2 diabetes mellitus with hyperlipidemia ([place])   . Sepsis ([place]) 02/16/2020  . Acute on chronic respiratory failure with hypoxia ([place]) 02/16/2020  . Edema of left lower leg 02/16/2020  . Pneumonia due to COVID-19 virus 02/15/2020  . Acute on chronic diastolic HF (heart failure) ([place]) 11/18/2019  . Dyslipidemia 11/18/2019  . Polypharmacy 04/13/2019  . Healthcare maintenance 10/15/2017  . ARF (acute renal failure) ([place]) 09/07/2017  . Chest pain 09/05/2017  . Ineffective health maintenance 06/26/2017  . Medication management 06/26/2017  . Retinal edema 10/07/2016  . Postherpetic neuralgia 12/21/2015  . Herpes zoster with complication 13/24/4010  . Cellulitis of face 11/27/2015  . Anemia of chronic disease 08/17/2015  . History of  colonic polyps   . Hypertensive emergency 05/20/2015  . Hypokalemia 05/20/2015  . AKI (acute kidney injury) ([place])   . Esophageal dysmotility 03/01/2015  . Throat congestion 10/11/2014  . Morbid obesity due to excess calories ([place]) 09/23/2014  . Acute asthmatic bronchitis 05/20/2014  .  Abdominal pain, other specified site 09/13/2013  . Elevated liver enzymes 09/13/2013  . Cough 05/05/2013  . Pain in lower limb 03/05/2013  . DOE (dyspnea on exertion) 02/03/2013  . Chronic respiratory failure ([place]) 12/13/2012  . Stricture and stenosis of esophagus 08/31/2012  . Onychomycosis 06/01/2012  . Pain in joint, ankle and foot 06/01/2012  . Diabetes mellitus type 2 in obese ([place]) 04/29/2012  . Essential hypertension 07/19/2011  . CKD (chronic kidney disease), stage III ([place]) 05/19/2011  . Family history of malignant neoplasm of gastrointestinal tract 10/30/2010  . Bloating 10/16/2010  . Epigastric pain 10/16/2010  . Foot pain 09/25/2010  . Dysphagia 07/16/2010  . GERD (gastroesophageal reflux disease) 07/16/2010  . Microcytic anemia 07/16/2010  . Obstructive sleep apnea 06/16/2009  . HYPERCHOLESTEROLEMIA 02/16/2009  . 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 Gastroparesis 08/21/2007  . Chronic obstructive asthma ([place]) 08/09/2006   PCP:  Andree Moro, DO Pharmacy:  No Pharmacies Listed    Social Determinants of Health (SDOH) Interventions    Readmission Risk Interventions No flowsheet data found.

## 2020-04-01 NOTE — Progress Notes (Signed)
SLP Cancellation Note  Patient Details Name: Joann Gutierrez MRN: [id number] DOB: 02/14/1945   Cancelled treatment:       Reason Eval/Treat Not Completed: Patient at procedure or test/unavailable  Gabriel Rainwater MA, CCC-SLP   Joann Gutierrez 04/01/2020, 8:53 AM

## 2020-04-01 NOTE — Progress Notes (Addendum)
Pt has returned from HD and is refusing PO meds and heparin. Pt has been educated on importance. Will attempt to give later. Dr. Florene Glen notified.

## 2020-04-01 NOTE — Progress Notes (Signed)
[facility] Progress Note   Subjective:  Seen in HD unit. UF goal 1L  Limited interaction but looks at me when spoken to.    Objective Vitals:   03/31/20 1940 04/01/20 0444 04/01/20 0755 04/01/20 0800  BP: 121/74 127/65 139/67 (!) 150/60  Pulse: 80 69    Resp: 15 20 17 18   Temp: 99.8 F (37.7 C) 99 F (37.2 C)    TempSrc: Axillary Axillary    SpO2: 95% 90%    Weight:      Height:         Additional Objective Labs: Basic Metabolic Panel: Recent Labs  Lab 03/29/20 1223 03/29/20 1336 03/30/20 0538 03/30/20 1145 03/31/20 0109  NA 135 137 137  --  141  K 4.6 4.5 3.8  --  4.1  CL 96*  --  98  --  106  CO2 25  --  27  --  24  GLUCOSE 151*  --  115*  --  112*  BUN 20  --  26*  --  10  CREATININE 7.02*  --  8.35*  --  4.43*  CALCIUM 8.3*  --  8.5*  --  8.3*  PHOS  --   --   --  5.5* 3.7   CBC: Recent Labs  Lab 03/29/20 1223 03/29/20 1336 03/30/20 0538 03/31/20 0109  WBC 10.8*  --  9.3 6.6  NEUTROABS 6.6  --   --  2.7  HGB 8.4* 10.2* 7.8* 7.8*  HCT 28.9* 30.0* 25.7* 26.5*  MCV 81.4  --  79.6* 80.8  PLT 192  --  188 143*   Blood Culture    Component Value Date/Time   SDES IN/OUT CATH URINE 02/16/2020 1119   SPECREQUEST  02/16/2020 1119    NONE Performed at [facility], [place] [address], [place], [place] [address]    CULT 20,000 COLONIES/mL ESCHERICHIA COLI (A) 02/16/2020 1119   REPTSTATUS 02/18/2020 FINAL 02/16/2020 1119     Physical Exam General: Elderly female, nad  Heart: RRR  No m,r,g  Lungs: Clear bilaterally  Abdomen: soft ntnd  Extremities: No sig LE edema  Dialysis Access: R IJ TDC   Medications: . sodium chloride    . sodium chloride    . iron sucrose Stopped (03/31/20 0543)   . ascorbic acid  500 mg Oral Daily  . aspirin EC  81 mg Oral Daily  . atorvastatin  40 mg Oral Daily  . calcium carbonate  1,250 mg Oral Daily  . Chlorhexidine Gluconate Cloth  6 each Topical Q0600  . donepezil  5 mg Oral QHS  .  doxercalciferol  3 mcg Intravenous Q T,Th,Sa-HD  . ezetimibe  10 mg Oral Daily  . fluticasone furoate-vilanterol  1 puff Inhalation Daily  . heparin  5,000 Units Subcutaneous Q12H  . insulin aspart  0-6 Units Subcutaneous TID WC  . LORazepam  0.5 mg Intravenous Once  . montelukast  10 mg Oral QHS  . pantoprazole  40 mg Oral Daily  . umeclidinium bromide  1 puff Inhalation Daily    Dialysis Orders:   [place] TTS  3.5h  400/800   107kg  2/2 bath  RIJ TDC Hep none  - hect 3ug tiw  - mircera 225 q2 , last 3/3  - venofer 50 mg per wk  Assessment/Plan: 1. AMS/Watershed distribution stroke -  pt w/ underlying dementia.  MRI brain with multifocal acute ischemia in watershed pattern.  W/u per neuro/primary. Neurontin dc'd.  2. ESRD - recent start to HD in Jan 2022. HD TTS. Next HD 3/12.  3. Recent COVID Pna admission 4. Dementia - advanced at baseline 5. HTN - cont meds as needed 6. Chronic debility- pt is bedbound 7. OSA 8. Anemia ckd - next esa due on 3/17.   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 Cont VDRA    Lynnda Child PA-C [facility] 04/01/2020,8:38 AM

## 2020-04-01 NOTE — Progress Notes (Signed)
Pt unable to wear CPAP at this time due to combative.

## 2020-04-01 NOTE — Progress Notes (Signed)
Unable to get BG on patient, patient refuses and becomes combative when attempting to touch her.

## 2020-04-01 NOTE — Progress Notes (Signed)
Attempted MRA head, pt combative at this time.

## 2020-04-01 NOTE — Progress Notes (Signed)
PROGRESS NOTE    Joann Gutierrez  YNW:[id number] DOB: 1945-02-16 DOA: 03/29/2020 PCP: Andree Moro, DO   Chief Complaint  Patient presents with  . Altered Mental Status   Brief Narrative:  Joann Gutierrez is Joann Gutierrez 75 y.o. female with medical history significant of CKD stage V recently started HD from January 2022, COPD, chronic hypoxic respite failure secondary to COPD, OSA on CPAP HS, dementia, ambulation dysfunction/bedbound, HTN, HLD, presented with altered mentation.  At baseline, patient appears to be very talkative and responding to visitors promptly, but usually only oriented to herself and place and confused about time.  However this morning, patient was found with minimal response, awake, and confused.  Husband denies patient has any pain is until last night, no diarrhea and appears to be at her baseline otherwise.  Husband reported that patient appears to have had problems with urination " very little urine compared to before January", and patient appears to be sleepy after HD sessions (TTS), usually she will sleep the rest of the day and became more awake and next day.  Last HD was yesterday.  Husband visited her 8 PM last night, and found she was sleepy which appears to be her baseline as she just had HD in the morning. Husband reported that patient had couple bouts of diarrhea last week but resolved, no diarrhea last 2 days.  ED Course: Patient was found awake, minimal response, not following command.  CT had poor quality.  Work-up showed VBG normal pH, PCO2 59, BUN 20.  Urine analysis pending.  Assessment & Plan:   Active Problems:   AMS (altered mental status)   Acute bilat watershed infarction [employer])  Goals of care: has communicated to daughter that she'd want "everything" done.  Discussed having palliative care come onboard and assist [place] with further discussions.  She'd like to hold off right now, wants to discuss with brother first.  Will continue to  discuss  Multifocal Acute Ischemia in deep watershed pattern  Acute Metabolic Encephalopathy  Hx Dementia Sounds like she's been declining generally since COVID infection (02/15/20) Fighting the staff yesterday morning, her daughter found her down on the ground VBG without hypercarbia, normal ammonia, TSH, vitamin B12, folate EEG with moderate diffuse encephalopathy Head CT with stable atrophy with periventricular small vessel disease, prior small infarct L cerebellum, no acute infarct, no mass or hemorrhage.  Foci of arterial vascular calcification, foci of paranasal sinus disease.   MRI brain with multifocal acute ischemia in deep watershed pattern, L>R Pending MRA brain LDL 166, , A1c 7.4 DAPT x 3 weeks, then plavix alone.  Atorvastatin, zetia Echo with EF 65-78%, grade 1 diastolic dysfunction, LV intracavitary gradient with obstruction (see report) Carotid dopplers with 1-39% stenosis bilaterally, antegrade flow Permissive hypertension - hold home BP meds CXR without acute disease.  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 -Continue Aricept  Chronic ambulation dysfunction/bedbound -Significant deterioration of ambulation status since January 2022. -PT evaluation  OSA -CPAP HS.  DVT prophylaxis: heparin  Code Status: full  Family Communication: daughter 3/11 Disposition:   Status is: Inpatient  Remains  inpatient appropriate because:Inpatient level of care appropriate due to severity of illness   Dispo: The patient is from: SNF              Anticipated d/c is to: SNF              Patient currently is not medically stable to d/c.   Difficult to place patient No   Consultants:   renal  Procedures:  none  Antimicrobials:  Anti-infectives (From admission, onward)   None     Subjective: Agitated today  Objective: Vitals:   04/01/20 1126 04/01/20 1135 04/01/20 1224 04/01/20 1608  BP: (!) 155/63 (!) 166/68 (!) 129/56 (!) 151/55  Pulse:  86 87 83  Resp: 20 (!) 21 19 15   Temp:  97.6 F (36.4 C) 97.6 F (36.4 C) 97.7 F (36.5 C)  TempSrc:  Axillary Axillary Axillary  SpO2:  95% 95% 94%  Weight:  102.5 kg    Height:        Intake/Output Summary (Last 24 hours) at 04/01/2020 1841 Last data filed at 04/01/2020 1324 Gross per 24 hour  Intake 0 ml  Output 500 ml  Net -500 ml   Filed Weights   03/30/20 1708 04/01/20 0750 04/01/20 1135  Weight: 102.2 kg 103.2 kg 102.5 kg    Examination:  General: No acute distress. Cardiovascular: Heart sounds show Joann Gutierrez regular rate, and rhythm.  Lungs: Clear to auscultation bilaterally  Abdomen: Soft, nontender, nondistended Neurological: moving all extremities, some intelligible speech, difficult to understand - does not follow commands today, more agitated  Skin: Warm and dry. No rashes or lesions. Extremities: No clubbing or cyanosis. No edema.    Data Reviewed: I have personally reviewed following labs and imaging studies  CBC: Recent Labs  Lab 03/29/20 1223 03/29/20 1336 03/30/20 0538 03/31/20 0109 04/01/20 0809  WBC 10.8*  --  9.3 6.6 5.5  NEUTROABS 6.6  --   --  2.7  --   HGB 8.4* 10.2* 7.8* 7.8* 7.4*  HCT 28.9* 30.0* 25.7* 26.5* 25.6*  MCV 81.4  --  79.6* 80.8 80.8  PLT 192  --  188 143* 681    Basic Metabolic Panel: Recent Labs  Lab 03/29/20 1223 03/29/20 1336 03/30/20 0538 03/30/20 1145 03/31/20 0109  04/01/20 0809  NA 135 137 137  --  141 144  K 4.6 4.5 3.8  --  4.1 3.6  CL 96*  --  98  --  106 106  CO2 25  --  27  --  24 27  GLUCOSE 151*  --  115*  --  112* 85  BUN 20  --  26*  --  10 23  CREATININE 7.02*  --  8.35*  --  4.43* 7.07*  CALCIUM 8.3*  --  8.5*  --  8.3* 8.1*  MG  --   --   --   --  2.0  --   PHOS  --   --   --  5.5* 3.7 4.7*    GFR: Estimated Creatinine Clearance: 8.1 mL/min (Loralyn Rachel) (by C-G formula based on SCr of 7.07 mg/dL (H)).  Liver Function Tests: Recent Labs  Lab 03/29/20 1223 03/31/20 0109 04/01/20 0809  AST 43* 38  --   ALT 12 8  --   ALKPHOS 68 60  --   BILITOT 0.6 0.4  --   PROT 7.1 6.6  --  ALBUMIN 2.9* 2.6* 2.6*    CBG: Recent Labs  Lab 03/31/20 1752 03/31/20 2102 04/01/20 1244 04/01/20 1711  GLUCAP 99 104* 74 72     Recent Results (from the past 240 hour(s))  SARS CORONAVIRUS 2 (TAT 6-24 HRS) Nasopharyngeal Nasopharyngeal Swab     Status: None   Collection Time: 03/30/20  1:48 AM   Specimen: Nasopharyngeal Swab  Result Value Ref Range Status   SARS Coronavirus 2 NEGATIVE NEGATIVE Final    Comment: (NOTE) SARS-CoV-2 target nucleic acids are NOT DETECTED.  The SARS-CoV-2 RNA is generally detectable in upper and lower respiratory specimens during the acute phase of infection. Negative results do not preclude SARS-CoV-2 infection, do not rule out co-infections with other pathogens, and should not be used as the sole basis for treatment or other patient management decisions. Negative results must be combined with clinical observations, patient history, and epidemiological information. The expected result is Negative.  Fact Sheet for Patients: SugarRoll.be  Fact Sheet for Healthcare Providers: https://www.woods-mathews.com/  This test is not yet approved or cleared by the [place] FDA and  has been authorized for detection and/or diagnosis of SARS-CoV-2 by FDA under an Emergency Use  Authorization (EUA). This EUA will remain  in effect (meaning this test can be used) for the duration of the COVID-19 declaration under Se ction 564(b)(1) of the Act, 21 U.S.C. section 360bbb-3(b)(1), unless the authorization is terminated or revoked sooner.  Performed at [facility], [place] [address], [place], [place] [address]          Radiology Studies: MR BRAIN WO CONTRAST  Result Date: 03/31/2020 CLINICAL DATA:  Delirium EXAM: MRI HEAD WITHOUT CONTRAST TECHNIQUE: Multiplanar, multiecho pulse sequences of the brain and surrounding structures were obtained without intravenous contrast. COMPARISON:  09/05/2017 FINDINGS: Brain: Multifocal bilateral abnormal diffusion restriction in Kemuel Buchmann deep watershed pattern. The left hemispheres worse than the right. No acute hemorrhage. Two foci of chronic microhemorrhage in the right temporal lobe and brainstem. Hyperintense T2-weighted signal is moderately widespread throughout the white matter. Generalized volume loss without Verma Grothaus clear lobar predilection. Old left cerebellar infarct. The midline structures are normal. Vascular: Major flow voids are preserved. Skull and upper cervical spine: Normal calvarium and skull base. Visualized upper cervical spine and soft tissues are normal. Sinuses/Orbits:No paranasal sinus fluid levels or advanced mucosal thickening. No mastoid or middle ear effusion. Normal orbits. IMPRESSION: Multifocal acute ischemia in Saoirse Legere deep watershed pattern, left worse than right. No hemorrhage or mass effect. Electronically Signed   By: Ulyses Jarred M.D.   On: 03/31/2020 00:57   ECHOCARDIOGRAM COMPLETE  Result Date: 03/31/2020    ECHOCARDIOGRAM REPORT   Patient Name:   PEACHES VANOVERBEKE Date of Exam: 03/31/2020 Medical Rec #:  [id number]           Height:       64.0 in Accession #:    [id number]          Weight:       225.3 lb Date of Birth:  [birth year]           BSA:          2.058 m Patient Age:    34 years            BP:            116/61 mmHg Patient Gender: F                   HR:  82 bpm. Exam Location:  Inpatient Procedure: 2D Echo, Cardiac Doppler and Color Doppler Indications:    Stroke  History:        Patient has prior history of Echocardiogram examinations, most                 recent 09/08/2017.  Sonographer:    Merrie Roof RDCS Referring Phys: [phone] Nikitas Davtyan CALDWELL POWELL JR IMPRESSIONS  1. LV intracavitary gradient with obstruction, max instantaneous gradient rest: 47 mmHg, Valsalva: 59 mmHg.  2. Left ventricular ejection fraction, by estimation, is 70 to 75%. The left ventricle has hyperdynamic function. The left ventricle has no regional wall motion abnormalities. There is moderate left ventricular hypertrophy. Left ventricular diastolic parameters are consistent with Grade I diastolic dysfunction (impaired relaxation).  3. Right ventricular systolic function is normal. The right ventricular size is normal. There is moderately elevated pulmonary artery systolic pressure. The estimated right ventricular systolic pressure is 78.2 mmHg.  4. The mitral valve is normal in structure. No evidence of mitral valve regurgitation. No evidence of mitral stenosis.  5. The aortic valve is grossly normal. Aortic valve regurgitation is not visualized. No aortic stenosis is present.  6. The inferior vena cava is normal in size with <50% respiratory variability, suggesting right atrial pressure of 8 mmHg. Conclusion(s)/Recommendation(s): No intracardiac source of embolism detected on this transthoracic study. Dyshon Philbin transesophageal echocardiogram can be considered to exclude cardiac source of embolism if clinically indicated. FINDINGS  Left Ventricle: Left ventricular ejection fraction, by estimation, is 70 to 75%. The left ventricle has hyperdynamic function. The left ventricle has no regional wall motion abnormalities. The left ventricular internal cavity size was small. There is moderate left ventricular hypertrophy. Left ventricular diastolic  parameters are consistent with Grade I diastolic dysfunction (impaired relaxation). Right Ventricle: The right ventricular size is normal. No increase in right ventricular wall thickness. Right ventricular systolic function is normal. There is moderately elevated pulmonary artery systolic pressure. The tricuspid regurgitant velocity is 3.12 m/s, and with an assumed right atrial pressure of 8 mmHg, the estimated right ventricular systolic pressure is 95.6 mmHg. Left Atrium: Left atrial size was normal in size. Right Atrium: Right atrial size was normal in size. Pericardium: There is no evidence of pericardial effusion. Mitral Valve: The mitral valve is normal in structure. No evidence of mitral valve regurgitation. No evidence of mitral valve stenosis. Tricuspid Valve: The tricuspid valve is normal in structure. Tricuspid valve regurgitation is mild . No evidence of tricuspid stenosis. Aortic Valve: The aortic valve is grossly normal. Aortic valve regurgitation is not visualized. No aortic stenosis is present. Pulmonic Valve: The pulmonic valve was not well visualized. Pulmonic valve regurgitation is trivial. No evidence of pulmonic stenosis. Aorta: The aortic root is normal in size and structure. Venous: The inferior vena cava is normal in size with less than 50% respiratory variability, suggesting right atrial pressure of 8 mmHg. IAS/Shunts: No atrial level shunt detected by color flow Doppler.  LEFT VENTRICLE PLAX 2D LVIDd:         3.20 cm  Diastology LVIDs:         1.90 cm  LV e' medial:    5.33 cm/s LV PW:         1.30 cm  LV E/e' medial:  11.3 LV IVS:        1.30 cm  LV e' lateral:   7.72 cm/s LVOT diam:     1.90 cm  LV E/e' lateral: 7.8 LV SV:  65 LV SV Index:   32 LVOT Area:     2.84 cm  RIGHT VENTRICLE          IVC RV Basal diam:  3.70 cm  IVC diam: 1.80 cm LEFT ATRIUM             Index       RIGHT ATRIUM           Index LA diam:        3.30 cm 1.60 cm/m  RA Area:     18.20 cm LA Vol (A2C):   79.9  ml 38.82 ml/m RA Volume:   50.20 ml  24.39 ml/m LA Vol (A4C):   51.1 ml 24.83 ml/m LA Biplane Vol: 67.0 ml 32.56 ml/m  AORTIC VALVE LVOT Vmax:   112.00 cm/s LVOT VTI:    0.230 m  AORTA Ao Root diam: 3.20 cm MITRAL VALVE               TRICUSPID VALVE MV Area (PHT): 2.87 cm    TR Peak grad:   38.9 mmHg MV Decel Time: 264 msec    TR Vmax:        312.00 cm/s MV E velocity: 60.00 cm/s MV Ranika Mcniel velocity: 96.10 cm/s  SHUNTS MV E/Cashton Hosley ratio:  0.62        Systemic VTI:  0.23 m                            Systemic Diam: 1.90 cm Cherlynn Kaiser MD Electronically signed by Cherlynn Kaiser MD Signature Date/Time: 03/31/2020/2:08:56 PM    Final    VAS US CAROTID (at [facility] and [facility] only)  Result Date: 04/01/2020 Carotid Arterial Duplex Study Indications:      CVA. Risk Factors:     Hypertension, hyperlipidemia, Diabetes, coronary artery                   disease. Comparison Study: previous 09/06/17 Performing Technologist: Abram Sander RVS  Examination Guidelines: Jodean Valade complete evaluation includes B-mode imaging, spectral Doppler, color Doppler, and power Doppler as needed of all accessible portions of each vessel. Bilateral testing is considered an integral part of Naly Schwanz complete examination. Limited examinations for reoccurring indications may be performed as noted.  Right Carotid Findings: +----------+--------+--------+--------+------------------+--------+           PSV cm/sEDV cm/sStenosisPlaque DescriptionComments +----------+--------+--------+--------+------------------+--------+ CCA Prox  193     11              heterogenous               +----------+--------+--------+--------+------------------+--------+ CCA Distal100     12              heterogenous               +----------+--------+--------+--------+------------------+--------+ ICA Prox  69      17      1-39%   heterogenous               +----------+--------+--------+--------+------------------+--------+ ICA Distal58      16                                          +----------+--------+--------+--------+------------------+--------+ ECA       167  tortuous +----------+--------+--------+--------+------------------+--------+ +----------+--------+-------+--------------+-------------------+           PSV cm/sEDV cmsDescribe      Arm Pressure (mmHG) +----------+--------+-------+--------------+-------------------+ Subclavian               Not identified                    +----------+--------+-------+--------------+-------------------+ +---------+--------+--+--------+--+---------+ VertebralPSV cm/s52EDV cm/s12Antegrade +---------+--------+--+--------+--+---------+  Left Carotid Findings: +----------+--------+--------+--------+------------------+--------+           PSV cm/sEDV cm/sStenosisPlaque DescriptionComments +----------+--------+--------+--------+------------------+--------+ CCA Prox  140     18              heterogenous               +----------+--------+--------+--------+------------------+--------+ CCA Distal75      12              heterogenous               +----------+--------+--------+--------+------------------+--------+ ICA Prox  75      15      1-39%   heterogenous               +----------+--------+--------+--------+------------------+--------+ ICA Distal72      21                                         +----------+--------+--------+--------+------------------+--------+ ECA       103                                       tortuous +----------+--------+--------+--------+------------------+--------+ +----------+--------+--------+--------+-------------------+           PSV cm/sEDV cm/sDescribeArm Pressure (mmHG) +----------+--------+--------+--------+-------------------+ [id number]                                         +----------+--------+--------+--------+-------------------+ +---------+--------+--+--------+--+---------+  VertebralPSV cm/s47EDV cm/s11Antegrade +---------+--------+--+--------+--+---------+   Summary: Right Carotid: Velocities in the right ICA are consistent with Felice Hope 1-39% stenosis. Left Carotid: Velocities in the left ICA are consistent with Nathanal Hermiz 1-39% stenosis. Vertebrals: Bilateral vertebral arteries demonstrate antegrade flow. *See table(s) above for measurements and observations.  Electronically signed by Antony Contras MD on 04/01/2020 at 12:52:38 PM.    Final         Scheduled Meds: . ascorbic acid  500 mg Oral Daily  . aspirin EC  81 mg Oral Daily  . atorvastatin  40 mg Oral Daily  . calcium carbonate  1,250 mg Oral Daily  . Chlorhexidine Gluconate Cloth  6 each Topical Q0600  . clopidogrel  75 mg Oral Daily  . donepezil  5 mg Oral QHS  . doxercalciferol  3 mcg Intravenous Q T,Th,Sa-HD  . ezetimibe  10 mg Oral Daily  . fluticasone furoate-vilanterol  1 puff Inhalation Daily  . heparin  5,000 Units Subcutaneous Q12H  . heparin sodium (porcine)      . insulin aspart  0-6 Units Subcutaneous TID WC  . LORazepam  0.5 mg Intravenous Once  . montelukast  10 mg Oral QHS  . pantoprazole  40 mg Oral Daily  . umeclidinium bromide  1 puff Inhalation Daily   Continuous Infusions: . iron sucrose Stopped (03/31/20 0543)     LOS: 3 days    Time spent: over 30 min  Fayrene Helper, MD [facility]   To contact the attending provider between 7A-7P or the covering provider during after hours 7P-7A, please log into the web site [email or web address] and access using universal [place] password for that web site. If you do not have the password, please call the hospital operator.  04/01/2020, 6:41 PM

## 2020-04-01 NOTE — [place] FL2 (Signed)
[place] MEDICAID FL2 LEVEL OF CARE SCREENING TOOL     IDENTIFICATION  Patient Name: Joann Gutierrez Birthdate: 1945-04-09 Sex: female Admission Date (Current Location): 03/29/2020  [facility] and [place] Number:  Herbalist and Address:  [facility], [place] [address], [place], [place] [address]      Provider Number: [id number]  Attending Physician Name and Address:  Elodia Florence., *  Relative Name and Phone Number:       Current Level of Care: Hospital Recommended Level of Care: [place] Prior Approval Number:    Date Approved/Denied:   PASRR Number: [id number]  Discharge Plan: SNF    Current Diagnoses: Patient Active Problem List   Diagnosis Date Noted  . Acute bilat watershed infarction [facility]) 03/31/2020  . AMS (altered mental status) 03/29/2020  . Acute metabolic encephalopathy 35/70/1779  . ESRD on hemodialysis ([place]) 03/09/2020  . Thrombocytopenia ([place]) 03/09/2020  . Dementia with behavioral disturbance ([place]) 03/09/2020  . COVID-19   . SOB (shortness of breath)   . Type 2 diabetes mellitus with hyperlipidemia ([place])   . Sepsis ([place]) 02/16/2020  . Acute on chronic respiratory failure with hypoxia ([place]) 02/16/2020  . Edema of left lower leg 02/16/2020  . Pneumonia due to COVID-19 virus 02/15/2020  . Acute on chronic diastolic HF (heart failure) ([place]) 11/18/2019  . Dyslipidemia 11/18/2019  . Polypharmacy 04/13/2019  . Healthcare maintenance 10/15/2017  . ARF (acute renal failure) ([place]) 09/07/2017  . Chest pain 09/05/2017  . Ineffective health maintenance 06/26/2017  . Medication management 06/26/2017  . Retinal edema 10/07/2016  . Postherpetic neuralgia 12/21/2015  . Herpes zoster with complication 39/03/90  . Cellulitis of face 11/27/2015  . Anemia of chronic disease 08/17/2015  . History of colonic polyps   . Hypertensive emergency 05/20/2015  . Hypokalemia 05/20/2015  . AKI (acute kidney  injury) ([place])   . Esophageal dysmotility 03/01/2015  . Throat congestion 10/11/2014  . Morbid obesity due to excess calories ([place]) 09/23/2014  . Acute asthmatic bronchitis 05/20/2014  . Abdominal pain, other specified site 09/13/2013  . Elevated liver enzymes 09/13/2013  . Cough 05/05/2013  . Pain in lower limb 03/05/2013  . DOE (dyspnea on exertion) 02/03/2013  . Chronic respiratory failure ([place]) 12/13/2012  . Stricture and stenosis of esophagus 08/31/2012  . Onychomycosis 06/01/2012  . Pain in joint, ankle and foot 06/01/2012  . Diabetes mellitus type 2 in obese ([place]) 04/29/2012  . Essential hypertension 07/19/2011  . CKD (chronic kidney disease), stage III ([place]) 05/19/2011  . Family history of malignant neoplasm of gastrointestinal tract 10/30/2010  . Bloating 10/16/2010  . Epigastric pain 10/16/2010  . Foot pain 09/25/2010  . Dysphagia 07/16/2010  . GERD (gastroesophageal reflux disease) 07/16/2010  . Microcytic anemia 07/16/2010  . Obstructive sleep apnea 06/16/2009  . HYPERCHOLESTEROLEMIA 02/16/2009  . 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 Gastroparesis 08/21/2007  . Chronic obstructive asthma ([place]) 08/09/2006    Orientation RESPIRATION BLADDER Height & Weight     Self  Other (Comment) (CPAP at night) Incontinent Weight: 225 lb 15.5 oz (102.5 kg) Height:  5\' 4"  (162.6 cm)  BEHAVIORAL SYMPTOMS/MOOD NEUROLOGICAL BOWEL NUTRITION STATUS      Incontinent Diet (see DC summary)  AMBULATORY STATUS COMMUNICATION OF NEEDS Skin   Extensive Assist Verbally Normal                       Personal Care Assistance Level of Assistance  Bathing,Feeding,Dressing  Bathing Assistance: Maximum assistance Feeding assistance: Limited assistance Dressing Assistance: Maximum assistance     Functional Limitations Info  Sight,Hearing,Speech Sight Info: Impaired Hearing Info: Impaired Speech Info: Adequate    SPECIAL CARE FACTORS FREQUENCY  PT (By licensed PT),OT (By licensed  OT)     PT Frequency: 5x/wk OT Frequency: 5x/wk            Contractures Contractures Info: Not present    Additional Factors Info  Code Status,Allergies,Insulin Sliding Scale Code Status Info: Full Allergies Info: Morphine And Related, Promethazine Hcl   Insulin Sliding Scale Info: See DC Summary       Current Medications (04/01/2020):  This is the current hospital active medication list Current Facility-Administered Medications  Medication Dose Route Frequency Provider Last Rate Last Admin  . acetaminophen (TYLENOL) tablet 650 mg  650 mg Oral Q4H PRN Wynetta Fines T, MD      . albuterol (VENTOLIN HFA) 108 (90 Base) MCG/ACT inhaler 2 puff  2 puff Inhalation Q4H PRN Wynetta Fines T, MD      . ascorbic acid (VITAMIN C) tablet 500 mg  500 mg Oral Daily Wynetta Fines T, MD   500 mg at 03/31/20 0826  . aspirin EC tablet 81 mg  81 mg Oral Daily Wynetta Fines T, MD   81 mg at 03/31/20 6948  . atorvastatin (LIPITOR) tablet 40 mg  40 mg Oral Daily Elodia Florence., MD   40 mg at 03/31/20 1807  . azelastine (ASTELIN) 0.1 % nasal spray 1 spray  1 spray Each Nare BID PRN Wynetta Fines T, MD      . bisacodyl (DULCOLAX) EC tablet 5 mg  5 mg Oral Daily PRN Wynetta Fines T, MD      . calcium carbonate (OS-CAL - dosed in mg of elemental calcium) tablet 1,250 mg  1,250 mg Oral Daily Wynetta Fines T, MD   1,250 mg at 03/31/20 0826  . Chlorhexidine Gluconate Cloth 2 % PADS 6 each  6 each Topical Q0600 Roney Jaffe, MD   6 each at 04/01/20 [phone]  . donepezil (ARICEPT) tablet 5 mg  5 mg Oral QHS Wynetta Fines T, MD   5 mg at 03/30/20 2131  . doxercalciferol (HECTOROL) injection 3 mcg  3 mcg Intravenous Q T,Th,Sa-HD [name], NP   3 mcg at 04/01/20 0954  . ezetimibe (ZETIA) tablet 10 mg  10 mg Oral Daily Wynetta Fines T, MD   10 mg at 03/31/20 0826  . fluticasone furoate-vilanterol (BREO ELLIPTA) 100-25 MCG/INH 1 puff  1 puff Inhalation Daily Wynetta Fines T, MD   1 puff at 03/31/20 1003  .  guaiFENesin-dextromethorphan (ROBITUSSIN DM) 100-10 MG/5ML syrup 10 mL  10 mL Oral Q4H PRN Wynetta Fines T, MD      . heparin injection 5,000 Units  5,000 Units Subcutaneous Q12H Wynetta Fines T, MD   5,000 Units at 03/31/20 0826  . heparin sodium (porcine) 1000 UNIT/ML injection           . insulin aspart (novoLOG) injection 0-6 Units  0-6 Units Subcutaneous TID WC Elodia Florence., MD      . Ipratropium-Albuterol (COMBIVENT) respimat 1 puff  1 puff Inhalation Q6H PRN Wynetta Fines T, MD      . iron sucrose (VENOFER) 50 mg in sodium chloride 0.9 % 100 mL IVPB  50 mg Intravenous Q Thu-HD [name], NP   Stopped at 03/31/20 0543  . LORazepam (ATIVAN) injection 0.5 mg  0.5  mg Intravenous Once Vernelle Emerald, MD      . montelukast (SINGULAIR) tablet 10 mg  10 mg Oral QHS Wynetta Fines T, MD   10 mg at 03/30/20 2131  . pantoprazole (PROTONIX) EC tablet 40 mg  40 mg Oral Daily Wynetta Fines T, MD   40 mg at 03/31/20 0826  . polyethylene glycol (MIRALAX / GLYCOLAX) packet 17 g  17 g Oral Daily PRN Wynetta Fines T, MD      . umeclidinium bromide (INCRUSE ELLIPTA) 62.5 MCG/INH 1 puff  1 puff Inhalation Daily Lequita Halt, MD   1 puff at 03/31/20 1003     Discharge Medications: Please see discharge summary for a list of discharge medications.  Relevant Imaging Results:  Relevant Lab Results:   Additional Information SS#: [id number]  Geralynn Ochs, LCSW

## 2020-04-01 NOTE — Progress Notes (Signed)
Patient combative not wanting to be touched. Difficult to complete assessment, writer unable to tell whether patient has any understanding. Was told by day nurse that patient does speak however patient not willing to speak to writer at all. Completed portion of assessment that is documented. Will continue to monitor at this time.

## 2020-04-02 ENCOUNTER — Inpatient Hospital Stay (HOSPITAL_COMMUNITY): Payer: Medicare HMO

## 2020-04-02 DIAGNOSIS — I10 Essential (primary) hypertension: Secondary | ICD-10-CM | POA: Diagnosis not present

## 2020-04-02 LAB — CBC WITH DIFFERENTIAL/PLATELET
Abs Immature Granulocytes: 0 10*3/uL (ref 0.00–0.07)
Basophils Absolute: 0.2 10*3/uL — ABNORMAL HIGH (ref 0.0–0.1)
Basophils Relative: 4 %
Eosinophils Absolute: 0.1 10*3/uL (ref 0.0–0.5)
Eosinophils Relative: 1 %
HCT: 25.3 % — ABNORMAL LOW (ref 36.0–46.0)
Hemoglobin: 7.5 g/dL — ABNORMAL LOW (ref 12.0–15.0)
Lymphocytes Relative: 33 %
Lymphs Abs: 1.7 10*3/uL (ref 0.7–4.0)
MCH: 23.5 pg — ABNORMAL LOW (ref 26.0–34.0)
MCHC: 29.6 g/dL — ABNORMAL LOW (ref 30.0–36.0)
MCV: 79.3 fL — ABNORMAL LOW (ref 80.0–100.0)
Monocytes Absolute: 1.6 10*3/uL — ABNORMAL HIGH (ref 0.1–1.0)
Monocytes Relative: 31 %
Neutro Abs: 1.6 10*3/uL — ABNORMAL LOW (ref 1.7–7.7)
Neutrophils Relative %: 31 %
Platelets: 141 10*3/uL — ABNORMAL LOW (ref 150–400)
RBC: 3.19 MIL/uL — ABNORMAL LOW (ref 3.87–5.11)
RDW: 21.9 % — ABNORMAL HIGH (ref 11.5–15.5)
WBC: 5.2 10*3/uL (ref 4.0–10.5)
nRBC: 0 % (ref 0.0–0.2)
nRBC: 0 /100 WBC

## 2020-04-02 LAB — LIPID PANEL
Cholesterol: 186 mg/dL (ref 0–200)
HDL: 30 mg/dL — ABNORMAL LOW (ref >40)
LDL Cholesterol: 123 mg/dL — ABNORMAL HIGH (ref 0–99)
Total CHOL/HDL Ratio: 6.2 RATIO
Triglycerides: 166 mg/dL — ABNORMAL HIGH (ref <150)
VLDL: 33 mg/dL (ref 0–40)

## 2020-04-02 LAB — PHOSPHORUS: Phosphorus: 3.4 mg/dL (ref 2.5–4.6)

## 2020-04-02 LAB — GLUCOSE, CAPILLARY
Glucose-Capillary: 104 mg/dL — ABNORMAL HIGH (ref 70–99)
Glucose-Capillary: 81 mg/dL (ref 70–99)
Glucose-Capillary: 163 mg/dL — ABNORMAL HIGH (ref 70–99)
Glucose-Capillary: 190 mg/dL — ABNORMAL HIGH (ref 70–99)

## 2020-04-02 LAB — COMPREHENSIVE METABOLIC PANEL
ALT: 11 U/L (ref 0–44)
AST: 29 U/L (ref 15–41)
Albumin: 2.7 g/dL — ABNORMAL LOW (ref 3.5–5.0)
Alkaline Phosphatase: 53 U/L (ref 38–126)
Anion gap: 11 (ref 5–15)
BUN: 10 mg/dL (ref 8–23)
CO2: 26 mmol/L (ref 22–32)
Calcium: 7.9 mg/dL — ABNORMAL LOW (ref 8.9–10.3)
Chloride: 102 mmol/L (ref 98–111)
Creatinine, Ser: 4.71 mg/dL — ABNORMAL HIGH (ref 0.44–1.00)
GFR, Estimated: 9 mL/min — ABNORMAL LOW (ref >60)
Glucose, Bld: 102 mg/dL — ABNORMAL HIGH (ref 70–99)
Potassium: 2.9 mmol/L — ABNORMAL LOW (ref 3.5–5.1)
Sodium: 139 mmol/L (ref 135–145)
Total Bilirubin: 1.1 mg/dL (ref 0.3–1.2)
Total Protein: 6.7 g/dL (ref 6.5–8.1)

## 2020-04-02 LAB — HEMOGLOBIN A1C
Hgb A1c MFr Bld: 6.1 % — ABNORMAL HIGH (ref 4.8–5.6)
Mean Plasma Glucose: 128.37 mg/dL

## 2020-04-02 LAB — MAGNESIUM: Magnesium: 1.9 mg/dL (ref 1.7–2.4)

## 2020-04-02 MED ORDER — POTASSIUM CHLORIDE 20 MEQ PO PACK
20.0000 meq | PACK | Freq: Once | ORAL | Status: AC
  Administered 2020-04-02: 20 meq via ORAL
  Filled 2020-04-02: qty 1

## 2020-04-02 NOTE — Progress Notes (Addendum)
PROGRESS NOTE    Joann Gutierrez  OEU:[id number] DOB: [birth year] DOA: 03/29/2020 PCP: Andree Moro, DO   Chief Complaint  Patient presents with  . Altered Mental Status   Brief Narrative:  Joann Gutierrez is Joann Gutierrez 75 y.o. female with medical history significant of CKD stage V recently started HD from January 2022, COPD, chronic hypoxic respite failure secondary to COPD, OSA on CPAP HS, dementia, ambulation dysfunction/bedbound, HTN, HLD, presented with altered mentation.  At baseline, patient appears to be very talkative and responding to visitors promptly, but usually only oriented to herself and place and confused about time.  However this morning, patient was found with minimal response, awake, and confused.  Husband denies patient has any pain is until last night, no diarrhea and appears to be at her baseline otherwise.  Husband reported that patient appears to have had problems with urination " very little urine compared to before January", and patient appears to be sleepy after HD sessions (TTS), usually she will sleep the rest of the day and became more awake and next day.  Last HD was yesterday.  Husband visited her 8 PM last night, and found she was sleepy which appears to be her baseline as she just had HD in the morning. Husband reported that patient had couple bouts of diarrhea last week but resolved, no diarrhea last 2 days.  ED Course: Patient was found awake, minimal response, not following command.  CT had poor quality.  Work-up showed VBG normal pH, PCO2 59, BUN 20.  Urine analysis pending.  Assessment & Plan:   Active Problems:   AMS (altered mental status)   Acute bilat watershed infarction [facility])  Goals of care: has communicated to daughter that she'd want "everything" done.  Discussed having palliative care come onboard and assist [place] with further discussions.   They want to continue full scope of care for now.  Will continue to have discussions with family.    Multifocal Acute Ischemia in deep watershed pattern  Acute Metabolic Encephalopathy  Hx Dementia Sounds like she's been declining generally since COVID infection (02/15/20) Fighting the staff yesterday morning, her daughter found her down on the ground VBG without hypercarbia, normal ammonia, TSH, vitamin B12, folate EEG with moderate diffuse encephalopathy Head CT with stable atrophy with periventricular small vessel disease, prior small infarct L cerebellum, no acute infarct, no mass or hemorrhage.  Foci of arterial vascular calcification, foci of paranasal sinus disease.   MRI brain with multifocal acute ischemia in deep watershed pattern, L>R Pending MRA brain (3/13) LDL 166, A1c 7.4 DAPT x 3 weeks, then plavix alone.  Atorvastatin, zetia Echo with EF 15-40%, grade 1 diastolic dysfunction, LV intracavitary gradient with obstruction (see report) Carotid dopplers with 1-39% stenosis bilaterally, antegrade flow Permissive hypertension - hold home BP meds Neurology c/s, appreciate recs - per note - ASA/plavix x 3 weeks, then plavix alone CXR without acute disease.  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 -Continue Aricept  Chronic ambulation dysfunction/bedbound -Significant deterioration of ambulation status since January 2022. -PT  evaluation  OSA -CPAP HS.  DVT prophylaxis: heparin Code Status: full  Family Communication: daughter 3/11 Disposition:   Status is: Inpatient  Remains inpatient appropriate because:Inpatient level of care appropriate due to severity of illness   Dispo: The patient is from: SNF              Anticipated d/c is to: SNF              Patient currently is not medically stable to d/c.   Difficult to place patient No   Consultants:   renal  Procedures:  none  Antimicrobials:  Anti-infectives (From admission, onward)   None     Subjective: More cooperative, denies complaints Confused, difficult to communicate with   Objective: Vitals:   04/02/20 0000 04/02/20 0400 04/02/20 0740 04/02/20 0808  BP: (!) 127/58 (!) 150/68 111/67   Pulse: 70 70 70   Resp: 15 16 20    Temp: 98 F (36.7 C) 97.6 F (36.4 C) 98 F (36.7 C)   TempSrc: Oral Axillary Axillary   SpO2: 90% 94% 96% 96%  Weight:  103.2 kg    Height:        Intake/Output Summary (Last 24 hours) at 04/02/2020 1038 Last data filed at 04/02/2020 0300 Gross per 24 hour  Intake 120 ml  Output 500 ml  Net -380 ml   Filed Weights   04/01/20 0750 04/01/20 1135 04/02/20 0400  Weight: 103.2 kg 102.5 kg 103.2 kg    Examination:  General: No acute distress. Cardiovascular: Heart sounds show Vernor Monnig regular rate, and rhythm Lungs: Clear to auscultation bilaterally Abdomen: Soft, nontender, nondistended Neurological: Alert, but disoriented. Moves all extremities 4 (follows commands inconsistently). Cranial nerves II through XII grossly intact. Skin: Warm and dry. No rashes or lesions. Extremities: No clubbing or cyanosis. No edema.   Data Reviewed: I have personally reviewed following labs and imaging studies  CBC: Recent Labs  Lab 03/29/20 1223 03/29/20 1336 03/30/20 0538 03/31/20 0109 04/01/20 0809 04/02/20 0548  WBC 10.8*  --  9.3 6.6 5.5 5.2  NEUTROABS 6.6  --   --  2.7  --  1.6*  HGB 8.4* 10.2* 7.8* 7.8*  7.4* 7.5*  HCT 28.9* 30.0* 25.7* 26.5* 25.6* 25.3*  MCV 81.4  --  79.6* 80.8 80.8 79.3*  PLT 192  --  188 143* 158 141*    Basic Metabolic Panel: Recent Labs  Lab 03/29/20 1223 03/29/20 1336 03/30/20 0538 03/30/20 1145 03/31/20 0109 04/01/20 0809 04/02/20 0548  NA 135 137 137  --  141 144 139  K 4.6 4.5 3.8  --  4.1 3.6 2.9*  CL 96*  --  98  --  106 106 102  CO2 25  --  27  --  24 27 26   GLUCOSE 151*  --  115*  --  112* 85 102*  BUN 20  --  26*  --  10 23 10   CREATININE 7.02*  --  8.35*  --  4.43* 7.07* 4.71*  CALCIUM 8.3*  --  8.5*  --  8.3* 8.1* 7.9*  MG  --   --   --   --  2.0  --  1.9  PHOS  --   --   --  5.5* 3.7 4.7* 3.4    GFR: Estimated Creatinine Clearance: 12.3 mL/min (Dorette Hartel) (by C-G formula based on  SCr of 4.71 mg/dL (H)).  Liver Function Tests: Recent Labs  Lab 03/29/20 1223 03/31/20 0109 04/01/20 0809 04/02/20 0548  AST 43* 38  --  29  ALT 12 8  --  11  ALKPHOS 68 60  --  53  BILITOT 0.6 0.4  --  1.1  PROT 7.1 6.6  --  6.7  ALBUMIN 2.9* 2.6* 2.6* 2.7*    CBG: Recent Labs  Lab 03/31/20 1752 03/31/20 2102 04/01/20 1244 04/01/20 1711 04/02/20 0743  GLUCAP 99 104* 74 72 104*     Recent Results (from the past 240 hour(s))  SARS CORONAVIRUS 2 (TAT 6-24 HRS) Nasopharyngeal Nasopharyngeal Swab     Status: None   Collection Time: 03/30/20  1:48 AM   Specimen: Nasopharyngeal Swab  Result Value Ref Range Status   SARS Coronavirus 2 NEGATIVE NEGATIVE Final    Comment: (NOTE) SARS-CoV-2 target nucleic acids are NOT DETECTED.  The SARS-CoV-2 RNA is generally detectable in upper and lower respiratory specimens during the acute phase of infection. Negative results do not preclude SARS-CoV-2 infection, do not rule out co-infections with other pathogens, and should not be used as the sole basis for treatment or other patient management decisions. Negative results must be combined with clinical observations, patient history, and epidemiological  information. The expected result is Negative.  Fact Sheet for Patients: SugarRoll.be  Fact Sheet for Healthcare Providers: https://www.woods-mathews.com/  This test is not yet approved or cleared by the [place] FDA and  has been authorized for detection and/or diagnosis of SARS-CoV-2 by FDA under an Emergency Use Authorization (EUA). This EUA will remain  in effect (meaning this test can be used) for the duration of the COVID-19 declaration under Se ction 564(b)(1) of the Act, 21 U.S.C. section 360bbb-3(b)(1), unless the authorization is terminated or revoked sooner.  Performed at [facility], [place] [address], [place], [place] [address]          Radiology Studies: ECHOCARDIOGRAM COMPLETE  Result Date: 03/31/2020    ECHOCARDIOGRAM REPORT   Patient Name:   KEYONNI PERCIVAL Date of Exam: 03/31/2020 Medical Rec #:  [id number]           Height:       64.0 in Accession #:    [id number]          Weight:       225.3 lb Date of Birth:  1945/06/14           BSA:          2.058 m Patient Age:    4 years            BP:           116/61 mmHg Patient Gender: F                   HR:           82 bpm. Exam Location:  Inpatient Procedure: 2D Echo, Cardiac Doppler and Color Doppler Indications:    Stroke  History:        Patient has prior history of Echocardiogram examinations, most                 recent 09/08/2017.  Sonographer:    Merrie Roof RDCS Referring Phys: [phone] [place] Zaragoza CALDWELL POWELL JR IMPRESSIONS  1. LV intracavitary gradient with obstruction, max instantaneous gradient rest: 47 mmHg, Valsalva: 59 mmHg.  2. Left ventricular ejection fraction, by estimation, is 70 to 75%. The left ventricle  has hyperdynamic function. The left ventricle has no regional wall motion abnormalities. There is moderate left ventricular hypertrophy. Left ventricular diastolic parameters are consistent with Grade I diastolic dysfunction (impaired relaxation).  3. Right  ventricular systolic function is normal. The right ventricular size is normal. There is moderately elevated pulmonary artery systolic pressure. The estimated right ventricular systolic pressure is 78.4 mmHg.  4. The mitral valve is normal in structure. No evidence of mitral valve regurgitation. No evidence of mitral stenosis.  5. The aortic valve is grossly normal. Aortic valve regurgitation is not visualized. No aortic stenosis is present.  6. The inferior vena cava is normal in size with <50% respiratory variability, suggesting right atrial pressure of 8 mmHg. Conclusion(s)/Recommendation(s): No intracardiac source of embolism detected on this transthoracic study. Jene Huq transesophageal echocardiogram can be considered to exclude cardiac source of embolism if clinically indicated. FINDINGS  Left Ventricle: Left ventricular ejection fraction, by estimation, is 70 to 75%. The left ventricle has hyperdynamic function. The left ventricle has no regional wall motion abnormalities. The left ventricular internal cavity size was small. There is moderate left ventricular hypertrophy. Left ventricular diastolic parameters are consistent with Grade I diastolic dysfunction (impaired relaxation). Right Ventricle: The right ventricular size is normal. No increase in right ventricular wall thickness. Right ventricular systolic function is normal. There is moderately elevated pulmonary artery systolic pressure. The tricuspid regurgitant velocity is 3.12 m/s, and with an assumed right atrial pressure of 8 mmHg, the estimated right ventricular systolic pressure is 69.6 mmHg. Left Atrium: Left atrial size was normal in size. Right Atrium: Right atrial size was normal in size. Pericardium: There is no evidence of pericardial effusion. Mitral Valve: The mitral valve is normal in structure. No evidence of mitral valve regurgitation. No evidence of mitral valve stenosis. Tricuspid Valve: The tricuspid valve is normal in structure. Tricuspid  valve regurgitation is mild . No evidence of tricuspid stenosis. Aortic Valve: The aortic valve is grossly normal. Aortic valve regurgitation is not visualized. No aortic stenosis is present. Pulmonic Valve: The pulmonic valve was not well visualized. Pulmonic valve regurgitation is trivial. No evidence of pulmonic stenosis. Aorta: The aortic root is normal in size and structure. Venous: The inferior vena cava is normal in size with less than 50% respiratory variability, suggesting right atrial pressure of 8 mmHg. IAS/Shunts: No atrial level shunt detected by color flow Doppler.  LEFT VENTRICLE PLAX 2D LVIDd:         3.20 cm  Diastology LVIDs:         1.90 cm  LV e' medial:    5.33 cm/s LV PW:         1.30 cm  LV E/e' medial:  11.3 LV IVS:        1.30 cm  LV e' lateral:   7.72 cm/s LVOT diam:     1.90 cm  LV E/e' lateral: 7.8 LV SV:         65 LV SV Index:   32 LVOT Area:     2.84 cm  RIGHT VENTRICLE          IVC RV Basal diam:  3.70 cm  IVC diam: 1.80 cm LEFT ATRIUM             Index       RIGHT ATRIUM           Index LA diam:        3.30 cm 1.60 cm/m  RA Area:     18.20  cm LA Vol (A2C):   79.9 ml 38.82 ml/m RA Volume:   50.20 ml  24.39 ml/m LA Vol (A4C):   51.1 ml 24.83 ml/m LA Biplane Vol: 67.0 ml 32.56 ml/m  AORTIC VALVE LVOT Vmax:   112.00 cm/s LVOT VTI:    0.230 m  AORTA Ao Root diam: 3.20 cm MITRAL VALVE               TRICUSPID VALVE MV Area (PHT): 2.87 cm    TR Peak grad:   38.9 mmHg MV Decel Time: 264 msec    TR Vmax:        312.00 cm/s MV E velocity: 60.00 cm/s MV Averil Digman velocity: 96.10 cm/s  SHUNTS MV E/Mckaylah Bettendorf ratio:  0.62        Systemic VTI:  0.23 m                            Systemic Diam: 1.90 cm Cherlynn Kaiser MD Electronically signed by Cherlynn Kaiser MD Signature Date/Time: 03/31/2020/2:08:56 PM    Final    VAS US CAROTID (at [facility] and [facility] only)  Result Date: 04/01/2020 Carotid Arterial Duplex Study Indications:      CVA. Risk Factors:     Hypertension, hyperlipidemia, Diabetes, coronary artery                    disease. Comparison Study: previous 09/06/17 Performing Technologist: Abram Sander RVS  Examination Guidelines: Jahshua Bonito complete evaluation includes B-mode imaging, spectral Doppler, color Doppler, and power Doppler as needed of all accessible portions of each vessel. Bilateral testing is considered an integral part of Damel Querry complete examination. Limited examinations for reoccurring indications may be performed as noted.  Right Carotid Findings: +----------+--------+--------+--------+------------------+--------+           PSV cm/sEDV cm/sStenosisPlaque DescriptionComments +----------+--------+--------+--------+------------------+--------+ CCA Prox  193     11              heterogenous               +----------+--------+--------+--------+------------------+--------+ CCA Distal100     12              heterogenous               +----------+--------+--------+--------+------------------+--------+ ICA Prox  69      17      1-39%   heterogenous               +----------+--------+--------+--------+------------------+--------+ ICA Distal58      16                                         +----------+--------+--------+--------+------------------+--------+ ECA       167                                       tortuous +----------+--------+--------+--------+------------------+--------+ +----------+--------+-------+--------------+-------------------+           PSV cm/sEDV cmsDescribe      Arm Pressure (mmHG) +----------+--------+-------+--------------+-------------------+ Subclavian               Not identified                    +----------+--------+-------+--------------+-------------------+ +---------+--------+--+--------+--+---------+ VertebralPSV cm/s52EDV cm/s12Antegrade +---------+--------+--+--------+--+---------+  Left Carotid Findings: +----------+--------+--------+--------+------------------+--------+  PSV cm/sEDV cm/sStenosisPlaque  DescriptionComments +----------+--------+--------+--------+------------------+--------+ CCA Prox  140     18              heterogenous               +----------+--------+--------+--------+------------------+--------+ CCA Distal75      12              heterogenous               +----------+--------+--------+--------+------------------+--------+ ICA Prox  75      15      1-39%   heterogenous               +----------+--------+--------+--------+------------------+--------+ ICA Distal72      21                                         +----------+--------+--------+--------+------------------+--------+ ECA       103                                       tortuous +----------+--------+--------+--------+------------------+--------+ +----------+--------+--------+--------+-------------------+           PSV cm/sEDV cm/sDescribeArm Pressure (mmHG) +----------+--------+--------+--------+-------------------+ [id number]                                         +----------+--------+--------+--------+-------------------+ +---------+--------+--+--------+--+---------+ VertebralPSV cm/s47EDV cm/s11Antegrade +---------+--------+--+--------+--+---------+   Summary: Right Carotid: Velocities in the right ICA are consistent with Antwaun Buth 1-39% stenosis. Left Carotid: Velocities in the left ICA are consistent with Wali Reinheimer 1-39% stenosis. Vertebrals: Bilateral vertebral arteries demonstrate antegrade flow. *See table(s) above for measurements and observations.  Electronically signed by Antony Contras MD on 04/01/2020 at 12:52:38 PM.    Final         Scheduled Meds: . ascorbic acid  500 mg Oral Daily  . aspirin EC  81 mg Oral Daily  . atorvastatin  40 mg Oral Daily  . calcium carbonate  1,250 mg Oral Daily  . Chlorhexidine Gluconate Cloth  6 each Topical Q0600  . clopidogrel  75 mg Oral Daily  . donepezil  5 mg Oral QHS  . doxercalciferol  3 mcg Intravenous Q T,Th,Sa-HD  . ezetimibe  10  mg Oral Daily  . fluticasone furoate-vilanterol  1 puff Inhalation Daily  . heparin  5,000 Units Subcutaneous Q12H  . insulin aspart  0-6 Units Subcutaneous TID WC  . LORazepam  0.5 mg Intravenous Once  . montelukast  10 mg Oral QHS  . pantoprazole  40 mg Oral Daily  . umeclidinium bromide  1 puff Inhalation Daily   Continuous Infusions: . iron sucrose Stopped (03/31/20 0543)     LOS: 4 days    Time spent: over 28 min    Fayrene Helper, MD [facility]   To contact the attending provider between 7A-7P or the covering provider during after hours 7P-7A, please log into the web site [email or web address] and access using universal [place] password for that web site. If you do not have the password, please call the hospital operator.  04/02/2020, 10:38 AM

## 2020-04-02 NOTE — Progress Notes (Signed)
Patient communicating at this time. Patient does speak clearly yet has responsive aphasia. Patient now sitting up eating at this time. Able to feed herself.

## 2020-04-02 NOTE — Progress Notes (Addendum)
[facility] Progress Note   Subjective:  Completed dialysis yesterday Seen in room, sleeping, difficult to rouse    Objective Vitals:   04/02/20 0000 04/02/20 0400 04/02/20 0740 04/02/20 0808  BP: (!) 127/58 (!) 150/68 111/67   Pulse: 70 70 70   Resp: 15 16 20    Temp: 98 F (36.7 C) 97.6 F (36.4 C) 98 F (36.7 C)   TempSrc: Oral Axillary Axillary   SpO2: 90% 94% 96% 96%  Weight:  103.2 kg    Height:         Additional Objective Labs: Basic Metabolic Panel: Recent Labs  Lab 03/31/20 0109 04/01/20 0809 04/02/20 0548  NA 141 144 139  K 4.1 3.6 2.9*  CL 106 106 102  CO2 24 27 26   GLUCOSE 112* 85 102*  BUN 10 23 10   CREATININE 4.43* 7.07* 4.71*  CALCIUM 8.3* 8.1* 7.9*  PHOS 3.7 4.7* 3.4   CBC: Recent Labs  Lab 03/29/20 1223 03/29/20 1336 03/30/20 0538 03/31/20 0109 04/01/20 0809 04/02/20 0548  WBC 10.8*  --  9.3 6.6 5.5 5.2  NEUTROABS 6.6  --   --  2.7  --  1.6*  HGB 8.4*   < > 7.8* 7.8* 7.4* 7.5*  HCT 28.9*   < > 25.7* 26.5* 25.6* 25.3*  MCV 81.4  --  79.6* 80.8 80.8 79.3*  PLT 192  --  188 143* 158 141*   < > = values in this interval not displayed.   Blood Culture    Component Value Date/Time   SDES IN/OUT CATH URINE 02/16/2020 1119   SPECREQUEST  02/16/2020 1119    NONE Performed at [facility], [place] [address] [place], [place] [address]    CULT 20,000 COLONIES/mL ESCHERICHIA COLI (A) 02/16/2020 1119   REPTSTATUS 02/18/2020 FINAL 02/16/2020 1119     Physical Exam General: Elderly female, nad  Heart: RRR  No m,r,g  Lungs: Clear bilaterally  Abdomen: soft ntnd  Extremities: No sig LE edema  Dialysis Access: R IJ TDC   Medications: . iron sucrose Stopped (03/31/20 0543)   . ascorbic acid  500 mg Oral Daily  . aspirin EC  81 mg Oral Daily  . atorvastatin  40 mg Oral Daily  . calcium carbonate  1,250 mg Oral Daily  . Chlorhexidine Gluconate Cloth  6 each Topical Q0600  . clopidogrel  75 mg Oral Daily  .  donepezil  5 mg Oral QHS  . doxercalciferol  3 mcg Intravenous Q T,Th,Sa-HD  . ezetimibe  10 mg Oral Daily  . fluticasone furoate-vilanterol  1 puff Inhalation Daily  . heparin  5,000 Units Subcutaneous Q12H  . insulin aspart  0-6 Units Subcutaneous TID WC  . LORazepam  0.5 mg Intravenous Once  . montelukast  10 mg Oral QHS  . pantoprazole  40 mg Oral Daily  . umeclidinium bromide  1 puff Inhalation Daily    Dialysis Orders:   [facility] TTS  3.5h  400/800   107kg  2/2 bath  RIJ TDC Hep none  - hect 3ug tiw  - mircera 225 q2 , last 3/3  - venofer 50 mg per wk  Assessment/Plan: 1. AMS/Watershed distribution stroke -  pt w/ underlying dementia.  MRI brain with multifocal acute ischemia in watershed pattern.  W/u per neuro/primary. Neurontin dc'd.   2. ESRD - recent start to HD in Jan 2022. HD TTS. Next HD 3/15.  3. Hypokalemia. K+ 2.9 will give KCl 2meq x1. Follow.  4. Recent COVID Pna admission 5. Dementia - advanced at baseline 6. HTN - cont meds as needed. BP/volume ok. Below outpatient dry weight.  7. Chronic debility- pt is bedbound 8. OSA 9. Anemia ckd - next esa due on 3/17.   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 Cont VDRA    [facility] 04/02/2020,10:53 AM  Pt seen, examined and agree w assess/plan as above with additions as indicated. Underlying debility/SNF dependence/ dementia and organ failure (ESRD) portend poor prognosis. Would consider recommending limits to care (DNR, consideration of hospice transition). Have d/w pmd.  [facility] 04/02/2020, 3:39 PM

## 2020-04-02 NOTE — Evaluation (Signed)
Occupational Therapy Evaluation Patient Details Name: Joann Gutierrez MRN: [id number] DOB: 11-15-1945 Today's Date: 04/02/2020    History of Present Illness 75 y.o. female presented via EMS with altered mentation with minimal response and no command follow. Admitted 03/29/20 for acute metabolic encephalopathy. CT with stable atrophy with periventricular small vessel disease, prior small infarct L cerebellum, no acute infarct. EEG moderate diffuse encephalopathy nonspecific etiology MRI revealed Multifocal acute ischemia in a deep watershed pattern, left worse than right. PMH: CKD stage V recently started HD from January 2022 after hospitalization for COVID , COPD, chronic hypoxic respite failure secondary to COPD, OSA on CPAP HS, dementia, ambulation dysfunction/bedbound, HTN, HLD,   Clinical Impression   Joann Gutierrez is a 75 year old woman who presents with impaired cognition, expressive aphasia, generalized weakness, decreased activity tolerance, impaired balance and obesity. On evaluation she was mi assist to transfer to side of bed needing assistance for RLE and patient using bed rails to assist. Patient total assist for toileting and LB dressing at this time but able to assist with grooming, feeding, UB ADLs and LB bathing with setup, supervision and verbal cues. Patient's communication is limited - her speech garbled and difficult to get words out. Significant amount of time to get her words out at times or mostly her words fall of and she doesn't complete the sentence. With attempt to stand patient unable to power up and she exhibited some fear with thoughts of standing. Patient performed feeding task at side of bed. She needed setup for food tray and demonstrated ability to bring food to mouth but difficulty getting food path her teeth like her mouth couldn't open wide enough. She was able to eat several bites of cake. Patient will benefit from skilled OT services while in hospital to  improve deficits, improve mobility and participation in self care tasks in order to reduce caregiver burden.   Follow Up Recommendations  SNF    Equipment Recommendations  None recommended by OT    Recommendations for Other Services       Precautions / Restrictions Precautions Precautions: Fall Restrictions Weight Bearing Restrictions: No      Mobility Bed Mobility Overal bed mobility: Needs Assistance Bed Mobility: Supine to Sit;Sit to Supine     Supine to sit: Min assist Sit to supine: Mod assist   General bed mobility comments: Min assist for RLE and increased time to transfer to side of bed. Patinet using bed rails. On return to supine patient mod assist for BLEs.    Transfers                 General transfer comment: attempted sit to stand - required increased to get patient to attempt but then was unable to power up.    Balance Overall balance assessment: Needs assistance Sitting-balance support: Feet supported Sitting balance-[name]: Fair Sitting balance - Comments: able to sit edge of bed                                   ADL either performed or assessed with clinical judgement   ADL Overall ADL's : Needs assistance/impaired Eating/Feeding: Set up;Supervision/ safety Eating/Feeding Details (indicate cue type and reason): able to bring food to mouth with utensil but difficulty getting into mouth (mouth not opening wide enough) Grooming: Set up;Wash/dry face   Upper Body Bathing: Set up;Supervision/ safety;Cueing for sequencing   Lower Body Bathing: Maximal  assistance;Supervison/ safety   Upper Body Dressing : Moderate assistance;Supervision/safety;Set up   Lower Body Dressing: Total assistance;Bed level       Toileting- Clothing Manipulation and Hygiene: Total assistance;Bed level               Vision   Vision Assessment?: Vision impaired- to be further tested in functional context     Perception     Praxis       Pertinent Vitals/Pain Pain Assessment: Faces Pain Score: 0-No pain     Hand Dominance Right   Extremity/Trunk Assessment Upper Extremity Assessment Upper Extremity Assessment: Generalized weakness;RUE deficits/detail;LUE deficits/detail RUE Deficits / Details: WFL ROM, unable to test strength RUE Coordination: WNL LUE Deficits / Details: WFL ROM, unable to test strength LUE Coordination: WNL   Lower Extremity Assessment Lower Extremity Assessment: Defer to PT evaluation   Cervical / Trunk Assessment Cervical / Trunk Assessment: Kyphotic   Communication Communication Communication: HOH;Expressive difficulties   Cognition Arousal/Alertness: Awake/alert Behavior During Therapy: Flat affect Overall Cognitive Status: No family/caregiver present to determine baseline cognitive functioning                                 General Comments: disoriented, answers to name, follows one step commands with increased time and restatement   General Comments       Exercises     Shoulder Instructions      Home Living Family/patient expects to be discharged to:: Skilled nursing facility [facility])                                        Prior Functioning/Environment Level of Independence: Needs assistance  Gait / Transfers Assistance Needed: reports up to w/c but not sure how ADL's / Homemaking Assistance Needed: garbled response, assuming full care            OT Problem List: Decreased strength;Decreased range of motion;Decreased activity tolerance;Impaired balance (sitting and/or standing);Decreased cognition;Decreased knowledge of use of DME or AE;Obesity      OT Treatment/Interventions: Self-care/ADL training;Therapeutic exercise;DME and/or AE instruction;Therapeutic activities;Balance training;Patient/family education    OT Goals(Current goals can be found in the care plan section) Acute Rehab OT Goals OT Goal Formulation: Patient  unable to participate in goal setting Time For Goal Achievement: 04/16/20 Potential to Achieve Goals: Fair  OT Frequency: Min 2X/week   Barriers to D/C:            Co-evaluation              AM-PAC OT "6 Clicks" Daily Activity     Outcome Measure Help from another person eating meals?: A Little Help from another person taking care of personal grooming?: A Little Help from another person toileting, which includes using toliet, bedpan, or urinal?: Total Help from another person bathing (including washing, rinsing, drying)?: A Lot Help from another person to put on and taking off regular upper body clothing?: A Lot Help from another person to put on and taking off regular lower body clothing?: Total 6 Click Score: 12   End of Session Equipment Utilized During Treatment: Rolling walker Nurse Communication:  (okay to see per RN)  Activity Tolerance: Patient tolerated treatment well Patient left: in bed;with call bell/phone within reach;with bed alarm set  OT Visit Diagnosis: Other abnormalities of gait and mobility (R26.89);Unsteadiness on feet (R26.81);Muscle  weakness (generalized) (M62.81);Other symptoms and signs involving cognitive function                Time: 1310-1334 OT Time Calculation (min): 24 min Charges:  OT General Charges $OT Visit: 1 Visit OT Evaluation $OT Eval Low Complexity: 1 Low OT Treatments $Self Care/Home Management : 8-22 mins  Tayva Easterday, OTR/L [place] [phone] Pager: [place] 04/02/2020, 2:20 PM

## 2020-04-03 DIAGNOSIS — I10 Essential (primary) hypertension: Secondary | ICD-10-CM | POA: Diagnosis not present

## 2020-04-03 LAB — CBC WITH DIFFERENTIAL/PLATELET
Abs Immature Granulocytes: 0 10*3/uL (ref 0.00–0.07)
Basophils Absolute: 0.1 10*3/uL (ref 0.0–0.1)
Basophils Relative: 1 %
Eosinophils Absolute: 0.1 10*3/uL (ref 0.0–0.5)
Eosinophils Relative: 2 %
HCT: 24.6 % — ABNORMAL LOW (ref 36.0–46.0)
Hemoglobin: 7.6 g/dL — ABNORMAL LOW (ref 12.0–15.0)
Lymphocytes Relative: 38 %
Lymphs Abs: 1.9 10*3/uL (ref 0.7–4.0)
MCH: 23.9 pg — ABNORMAL LOW (ref 26.0–34.0)
MCHC: 30.9 g/dL (ref 30.0–36.0)
MCV: 77.4 fL — ABNORMAL LOW (ref 80.0–100.0)
Monocytes Absolute: 0.8 10*3/uL (ref 0.1–1.0)
Monocytes Relative: 15 %
Neutro Abs: 2.2 10*3/uL (ref 1.7–7.7)
Neutrophils Relative %: 44 %
Platelets: 124 10*3/uL — ABNORMAL LOW (ref 150–400)
RBC: 3.18 MIL/uL — ABNORMAL LOW (ref 3.87–5.11)
RDW: 21.4 % — ABNORMAL HIGH (ref 11.5–15.5)
WBC: 5.1 10*3/uL (ref 4.0–10.5)
nRBC: 0 % (ref 0.0–0.2)
nRBC: 1 /100 WBC — ABNORMAL HIGH

## 2020-04-03 LAB — COMPREHENSIVE METABOLIC PANEL
ALT: 11 U/L (ref 0–44)
AST: 25 U/L (ref 15–41)
Albumin: 2.7 g/dL — ABNORMAL LOW (ref 3.5–5.0)
Alkaline Phosphatase: 55 U/L (ref 38–126)
Anion gap: 8 (ref 5–15)
BUN: 17 mg/dL (ref 8–23)
CO2: 26 mmol/L (ref 22–32)
Calcium: 7.9 mg/dL — ABNORMAL LOW (ref 8.9–10.3)
Chloride: 102 mmol/L (ref 98–111)
Creatinine, Ser: 6.35 mg/dL — ABNORMAL HIGH (ref 0.44–1.00)
GFR, Estimated: 6 mL/min — ABNORMAL LOW (ref >60)
Glucose, Bld: 148 mg/dL — ABNORMAL HIGH (ref 70–99)
Potassium: 2.9 mmol/L — ABNORMAL LOW (ref 3.5–5.1)
Sodium: 136 mmol/L (ref 135–145)
Total Bilirubin: 0.6 mg/dL (ref 0.3–1.2)
Total Protein: 6.8 g/dL (ref 6.5–8.1)

## 2020-04-03 LAB — GLUCOSE, CAPILLARY
Glucose-Capillary: 110 mg/dL — ABNORMAL HIGH (ref 70–99)
Glucose-Capillary: 129 mg/dL — ABNORMAL HIGH (ref 70–99)
Glucose-Capillary: 156 mg/dL — ABNORMAL HIGH (ref 70–99)
Glucose-Capillary: 125 mg/dL — ABNORMAL HIGH (ref 70–99)

## 2020-04-03 LAB — PHOSPHORUS: Phosphorus: 4.2 mg/dL (ref 2.5–4.6)

## 2020-04-03 LAB — MAGNESIUM: Magnesium: 2.1 mg/dL (ref 1.7–2.4)

## 2020-04-03 MED ORDER — MUSCLE RUB 10-15 % EX CREA
1.0000 "application " | TOPICAL_CREAM | CUTANEOUS | Status: DC | PRN
  Filled 2020-04-03: qty 85

## 2020-04-03 MED ORDER — POLYETHYLENE GLYCOL 3350 17 G PO PACK
17.0000 g | PACK | Freq: Two times a day (BID) | ORAL | Status: DC
  Administered 2020-04-04 – 2020-04-07 (×5): 17 g via ORAL
  Filled 2020-04-03 (×7): qty 1

## 2020-04-03 NOTE — Progress Notes (Signed)
[facility] Progress Note   Subjective: Seen in room. Not responding to verbal stimuli, jerks away when attempting to exam. HD tomorrow on schedule.   Objective Vitals:   04/02/20 1600 04/02/20 2023 04/03/20 0013 04/03/20 0426  BP: 134/68 (!) 110/48  112/62  Pulse: 76 70 67 63  Resp: 20 19 20 13   Temp: 97.8 F (36.6 C) 97.7 F (36.5 C)  98.5 F (36.9 C)  TempSrc: Oral Axillary  Axillary  SpO2: 98% 94%  97%  Weight:    103.2 kg  Height:       Physical Exam General: Elderly chronically ill pt in NAD Heart: S1,S2 RRR No M/R/G Lungs: CTAB Abdomen: obese, soft active BS Extremities: Trace BLE edema Dialysis Access: RIJ TDC Drsg intact.    Additional Objective Labs: Basic Metabolic Panel: Recent Labs  Lab 04/01/20 0809 04/02/20 0548 04/03/20 0129  NA 144 139 136  K 3.6 2.9* 2.9*  CL 106 102 102  CO2 27 26 26   GLUCOSE 85 102* 148*  BUN 23 10 17   CREATININE 7.07* 4.71* 6.35*  CALCIUM 8.1* 7.9* 7.9*  PHOS 4.7* 3.4 4.2   Liver Function Tests: Recent Labs  Lab 03/31/20 0109 04/01/20 0809 04/02/20 0548 04/03/20 0129  AST 38  --  29 25  ALT 8  --  11 11  ALKPHOS 60  --  53 55  BILITOT 0.4  --  1.1 0.6  PROT 6.6  --  6.7 6.8  ALBUMIN 2.6* 2.6* 2.7* 2.7*   No results for input(s): LIPASE, AMYLASE in the last 168 hours. CBC: Recent Labs  Lab 03/30/20 0538 03/31/20 0109 04/01/20 0809 04/02/20 0548 04/03/20 0129  WBC 9.3 6.6 5.5 5.2 5.1  NEUTROABS  --  2.7  --  1.6* 2.2  HGB 7.8* 7.8* 7.4* 7.5* 7.6*  HCT 25.7* 26.5* 25.6* 25.3* 24.6*  MCV 79.6* 80.8 80.8 79.3* 77.4*  PLT 188 143* 158 141* 124*   Blood Culture    Component Value Date/Time   SDES IN/OUT CATH URINE 02/16/2020 1119   SPECREQUEST  02/16/2020 1119    NONE Performed at [facility] [address] [place], [place] [address]    CULT 20,000 COLONIES/mL ESCHERICHIA COLI (A) 02/16/2020 1119   REPTSTATUS 02/18/2020 FINAL 02/16/2020 1119    Cardiac Enzymes: No results  for input(s): CKTOTAL, CKMB, CKMBINDEX, TROPONINI in the last 168 hours. CBG: Recent Labs  Lab 04/02/20 1146 04/02/20 1713 04/02/20 2039 04/03/20 0752 04/03/20 1210  GLUCAP 81 163* 190* 110* 129*   Iron Studies: No results for input(s): IRON, TIBC, TRANSFERRIN, FERRITIN in the last 72 hours. @lablastinr3 @ Studies/Results: MR ANGIO HEAD WO CONTRAST  Result Date: 04/02/2020 CLINICAL DATA:  75 year old female with recent altered mental status, delirium. Brain MRI 2 days ago demonstrating scattered acute, patchy bilateral cerebral hemisphere edema in a deep white matter, watershed pattern. EXAM: MRA HEAD WITHOUT CONTRAST TECHNIQUE: Angiographic images of the Circle of Willis were obtained using MRA technique without intravenous contrast. COMPARISON:  Brain MRI 03/31/2020.  Head CT 03/30/2020 and earlier. FINDINGS: No intracranial mass effect or midline shift. Ventricle size and configuration appears stable. Antegrade flow in the posterior circulation with fairly codominant distal vertebral arteries. No distal vertebral stenosis. Both PICA vessels appear to remain patent. Patent vertebrobasilar junction and basilar artery with mild tortuosity. No basilar stenosis. Normal SCA and PCA origins. Small bilateral posterior communicating arteries. Right PCA branches are within normal limits. There is a short segment moderate to severe stenosis in  the left PCA P2 as seen on series 255, image 7. Preserved distal left PCA flow signal. Antegrade flow in both ICA siphons. Tortuous right ICA just below the skull base. No siphon stenosis. Ophthalmic and posterior communicating artery origins appear normal. Patent carotid termini, MCA and ACA origins. Right A1 appears mildly dominant. Anterior communicating artery, visible bilateral ACA branches and a median artery of the corpus callosum are within normal limits. Right MCA M1 segment is patent with tortuosity but no stenosis. Patent right MCA bifurcation without  stenosis. Visible right MCA branches are within normal limits. Left MCA proximal M1 is normal but there is a long segment of moderate to severe stenosis in the distal left MCA M1 as seen on series 252, image 14 and 253 image 8. This somewhat abates before the left MCA bifurcation which remains patent. Left MCA branches appear within normal limits (on source images, some artifact is noted on the horizontal MIP images). IMPRESSION: 1. Negative for large vessel occlusion. 2. Short segment moderate to severe stenosis of the left PCA P2 segment. 3. 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 Electronically Signed   By: Genevie [name] M.D.   On: 04/02/2020 10:43   Medications: . iron sucrose Stopped (03/31/20 0543)   . ascorbic acid  500 mg Oral Daily  . aspirin EC  81 mg Oral Daily  . atorvastatin  40 mg Oral Daily  . calcium carbonate  1,250 mg Oral Daily  . Chlorhexidine Gluconate Cloth  6 each Topical Q0600  . clopidogrel  75 mg Oral Daily  . donepezil  5 mg Oral QHS  . doxercalciferol  3 mcg Intravenous Q T,Th,Sa-HD  . ezetimibe  10 mg Oral Daily  . fluticasone furoate-vilanterol  1 puff Inhalation Daily  . heparin  5,000 Units Subcutaneous Q12H  . insulin aspart  0-6 Units Subcutaneous TID WC  . LORazepam  0.5 mg Intravenous Once  . montelukast  10 mg Oral QHS  . pantoprazole  40 mg Oral Daily  . umeclidinium bromide  1 puff Inhalation Daily     Dialysis Orders:  [facility] T[facility] 3.5h 400/800 107kg 2/2 bath RIJ TDC Hep none - hect 3ug tiw - mircera 225 q2 , last 3/3 - venofer 50 mg per wk  Assessment/Plan: 1. AMS/Watershed distribution stroke -  pt w/ underlying dementia.  MRI brain with multifocal acute ischemia in watershed pattern.  W/u per neuro/primary. Neurontin dc'd.   2. ESRD - recent start to HD in Jan 2022. HD TTS. Next HD 3/15.  3. Hypokalemia. K+ 2.9 will give KCl 82meq x1. Follow.  4. Recent COVID Pna admission 5. Dementia - advanced at  baseline 6. HTN - cont meds as needed. BP/volume ok. Below outpatient dry weight.  7. Chronic debility- pt is bedbound 8. OSA 9. Anemia ckd - HGB down to 7.6. Transfuse in HD tomorrow if HGB < 7.0. next esa due on 3/17.   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 Cont VDRA  11. Goal of care: Patient remains full scope of care. Recommending Palliative Care for goals of care. Has been turned down to admission to [facility].   Terre Hanneman H. Jarek Longton NP-C 04/03/2020, 12:20 PM  [employer] [phone]

## 2020-04-03 NOTE — Evaluation (Signed)
Speech Language Pathology Evaluation Patient Details Name: MAKINZE JANI MRN: [id number] DOB: 1945/10/26 Today's Date: 04/03/2020 Time: 1050-1110 SLP Time Calculation (min) (ACUTE ONLY): 20 min  Problem List:  Patient Active Problem List   Diagnosis Date Noted  . Acute bilat watershed infarction [facility]) 03/31/2020  . AMS (altered mental status) 03/29/2020  . Acute metabolic encephalopathy 40/98/1191  . ESRD on hemodialysis ([place]) 03/09/2020  . Thrombocytopenia ([place]) 03/09/2020  . Dementia with behavioral disturbance ([place]) 03/09/2020  . COVID-19   . SOB (shortness of breath)   . Type 2 diabetes mellitus with hyperlipidemia ([place])   . Sepsis ([place]) 02/16/2020  . Acute on chronic respiratory failure with hypoxia ([place]) 02/16/2020  . Edema of left lower leg 02/16/2020  . Pneumonia due to COVID-19 virus 02/15/2020  . Acute on chronic diastolic HF (heart failure) ([place]) 11/18/2019  . Dyslipidemia 11/18/2019  . Polypharmacy 04/13/2019  . Healthcare maintenance 10/15/2017  . ARF (acute renal failure) ([place]) 09/07/2017  . Chest pain 09/05/2017  . Ineffective health maintenance 06/26/2017  . Medication management 06/26/2017  . Retinal edema 10/07/2016  . Postherpetic neuralgia 12/21/2015  . Herpes zoster with complication 47/82/9562  . Cellulitis of face 11/27/2015  . Anemia of chronic disease 08/17/2015  . History of colonic polyps   . Hypertensive emergency 05/20/2015  . Hypokalemia 05/20/2015  . AKI (acute kidney injury) ([place])   . Esophageal dysmotility 03/01/2015  . Throat congestion 10/11/2014  . Morbid obesity due to excess calories ([place]) 09/23/2014  . Acute asthmatic bronchitis 05/20/2014  . Abdominal pain, other specified site 09/13/2013  . Elevated liver enzymes 09/13/2013  . Cough 05/05/2013  . Pain in lower limb 03/05/2013  . DOE (dyspnea on exertion) 02/03/2013  . Chronic respiratory failure ([place]) 12/13/2012  . Stricture and stenosis of esophagus 08/31/2012  .  Onychomycosis 06/01/2012  . Pain in joint, ankle and foot 06/01/2012  . Diabetes mellitus type 2 in obese ([place]) 04/29/2012  . Essential hypertension 07/19/2011  . CKD (chronic kidney disease), stage III ([place]) 05/19/2011  . Family history of malignant neoplasm of gastrointestinal tract 10/30/2010  . Bloating 10/16/2010  . Epigastric pain 10/16/2010  . Foot pain 09/25/2010  . Dysphagia 07/16/2010  . GERD (gastroesophageal reflux disease) 07/16/2010  . Microcytic anemia 07/16/2010  . Obstructive sleep apnea 06/16/2009  . HYPERCHOLESTEROLEMIA 02/16/2009  . 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 Gastroparesis 08/21/2007  . Chronic obstructive asthma ([place]) 08/09/2006   Past Medical History:  Past Medical History:  Diagnosis Date  . Adenomatous colon polyp   . Allergy   . Anemia   . Asthma       . CAD (coronary artery disease)    Mild very minimal coronary disease with 20% obtuse marginal stenosis  . Carpal tunnel syndrome on left   . Cataract   . CHF (congestive heart failure) ([place])   . Chronic kidney disease (CKD), stage III (moderate) (H[facility])    now stage 4  . COPD (chronic obstructive pulmonary disease) ([place])   . Dementia with behavioral disturbance ([place]) 03/09/2020  . Depression   . 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 Diverticulosis   . Dyspnea   . 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 Esophageal dysmotility   . Esophageal stricture   . ESRD on hemodialysis ([place]) 03/09/2020  . Gastritis   . Gastroparesis 08/21/2007  . GERD (gastroesophageal reflux disease)   . Hearing loss of both ears   . Hernia, hiatal   .  Hyperlipidemia   . Hypertension   . Morbid obesity ([place])   . OSTEOARTHRITIS 08/09/2006  . Osteoporosis   . Oxygen deficiency    prn   . 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 RENAL INSUFFICIENCY 02/16/2009  . 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 Seizures ([place])    pt thinks it has been several monthes since she had a seisure  . Shingles    . Sickle cell trait ([place])   . Sleep apnea    uses cpap  . Stroke [facility])    Past Surgical History:  Past Surgical History:  Procedure Laterality Date  . ABDOMINAL HYSTERECTOMY    . ARTERY BIOPSY  01/07/2011   Procedure: MINOR BIOPSY TEMPORAL ARTERY;  Surgeon: Haywood Lasso, MD;  Location: [facility];  Service: General;  Laterality: Left;  left temporal artery biopsy  . AV FISTULA PLACEMENT Right 11/16/2018   Procedure: CREATION RIGHT BRACHIOCEPHALIC FISTULA  ARTERIOVENOUS FISTULA;  Surgeon: Angelia Mould, MD;  Location: [facility];  Service: Vascular;  Laterality: Right;  . bil foot surgery    . BREAST LUMPECTOMY     benign  . BREAST LUMPECTOMY     both breast lumps removed   . CATARACT EXTRACTION Left 06/2016   Dr. Read Drivers  . COLONOSCOPY    . COLONOSCOPY WITH PROPOFOL N/A 07/05/2015   Procedure: COLONOSCOPY WITH PROPOFOL;  Surgeon: Manus Gunning, MD;  Location: [facility];  Service: Gastroenterology;  Laterality: N/A;  . COLONOSCOPY WITH PROPOFOL N/A 08/04/2018   Procedure: COLONOSCOPY WITH PROPOFOL;  Surgeon: Yetta Flock, MD;  Location: [facility];  Service: Gastroenterology;  Laterality: N/A;  . ERD  08/08/2000  . ESOPHAGEAL MANOMETRY N/A 03/13/2015   Procedure: ESOPHAGEAL MANOMETRY (EM);  Surgeon: Manus Gunning, MD;  Location: [facility];  Service: Gastroenterology;  Laterality: N/A;  . ESOPHAGOGASTRODUODENOSCOPY  06/25/2006  . ESOPHAGOGASTRODUODENOSCOPY (EGD) WITH PROPOFOL N/A 07/05/2015   Procedure: ESOPHAGOGASTRODUODENOSCOPY (EGD) WITH PROPOFOL;  Surgeon: Manus Gunning, MD;  Location: [facility];  Service: Gastroenterology;  Laterality: N/A;  . FISTULA SUPERFICIALIZATION Right 11/16/2018   Procedure: Fistula Superficialization;  Surgeon: Angelia Mould, MD;  Location: [facility];  Service: Vascular;  Laterality: Right;  . HERNIA REPAIR    . IR PERC TUN PERIT CATH WO PORT S&I [place]  02/17/2020  . IR US GUIDE  VASC ACCESS RIGHT  02/18/2020  . LEFT AND RIGHT HEART CATHETERIZATION WITH CORONARY ANGIOGRAM N/A 03/03/2013   Procedure: LEFT AND RIGHT HEART CATHETERIZATION WITH CORONARY ANGIOGRAM;  Surgeon: Minus Breeding, MD;  Location: [facility] CATH LAB;  Service: Cardiovascular;  Laterality: N/A;  . REPLACEMENT TOTAL KNEE Left 1998  . UPPER GASTROINTESTINAL ENDOSCOPY     HPI:  76 y.o. female presented via EMS with altered mentation with minimal response and no command follow. Admitted 03/29/20 for acute metabolic encephalopathy. CT with stable atrophy with periventricular small vessel disease, prior small infarct L cerebellum, no acute infarct. EEG moderate diffuse encephalopathy nonspecific etiology MRI revealed Multifocal acute ischemia in a deep watershed pattern, left worse than right. PMH: CKD stage V recently started HD from January 2022 after hospitalization for COVID , COPD, chronic hypoxic respite failure secondary to COPD, OSA on CPAP HS, dementia, ambulation dysfunction/bedbound, HTN, HLD   Assessment / Plan / Recommendation Clinical Impression  Patient presents with a severe global aphasia and mod-severe cognitive impairment. She was not able to perform any confrontational naming, required maximal cues for automatic speech (counting) and was not able to perform any  responsive naming tasks. She would perseverat on last thing SLP said, such as when SLP introduced himself as being with 'speech therapy' and then when patient was asked her name, she stated, "therapy". She was able to identify error in misspelling of one letter in her first name and was able to identify number of fingers SLP held out in front of her. She was able to state that she was in the hospital and answered 3/6 biographical yes/no questions. Affect was flat and patient looked straight ahead, not turning head at all and barely shifting gaze to SLP when he was standing at her side. Patient also was refusing all food (breakfast meal) when offered,  even after verbally agreeing that she wanted to try a particular item on tray. Patient is significantly impaired and currently unable to perform even basic ADL's or communicate basic needs with others.    SLP Assessment  SLP Recommendation/Assessment: Patient needs continued Speech Lanaguage Pathology Services SLP Visit Diagnosis: Aphasia (R47.01);Dysarthria and anarthria (R47.1);Cognitive communication deficit (R41.841)    Follow Up Recommendations  Skilled Nursing facility;24 hour supervision/assistance    Frequency and Duration min 2x/week  1 week      SLP Evaluation Cognition  Overall Cognitive Status: No family/caregiver present to determine baseline cognitive functioning Arousal/Alertness: Awake/alert Orientation Level: Oriented to place;Disoriented to time;Disoriented to situation;Disoriented to person Attention: Focused Focused Attention: Impaired Focused Attention Impairment: Verbal basic;Functional basic Awareness: Impaired Awareness Impairment: Intellectual impairment Problem Solving: Impaired Problem Solving Impairment: Functional basic Executive Function: Initiating Initiating: Impaired Initiating Impairment: Verbal basic;Functional basic Behaviors: Perseveration Safety/Judgment: Impaired       Comprehension  Auditory Comprehension Overall Auditory Comprehension: Impaired Yes/No Questions: Impaired Basic Biographical Questions: 26-50% accurate Commands: Impaired One Step Basic Commands: 0-24% accurate Interfering Components: Attention;Processing speed EffectiveTechniques: Repetition;Visual/Gestural cues Reading Comprehension Reading Status: Not tested    Expression Expression Primary Mode of Expression: Verbal Verbal Expression Overall Verbal Expression: Impaired Initiation: Impaired Level of Generative/Spontaneous Verbalization: Word Repetition: Impaired Level of Impairment: Word level Naming: Impairment Responsive: 0-25% accurate Confrontation:  Impaired Convergent: Not tested Divergent: Not tested Verbal Errors: Not aware of errors;Perseveration Pragmatics: Impairment Impairments: Abnormal affect;Eye contact;Monotone Interfering Components: Attention Non-Verbal Means of Communication: Not applicable   Oral / Motor  Oral Motor/Sensory Function Overall Oral Motor/Sensory Function: Other (comment) (difficult to fully assess secondary to patient not fully compliant) Motor Speech Overall Motor Speech: Impaired Phonation: Normal Resonance: Within functional limits Articulation: Impaired Level of Impairment: Word Intelligibility: Intelligibility reduced Word: 50-74% accurate Phrase: 25-49% accurate Sentence: Not tested Conversation: Not tested Motor Planning: Impaired Level of Impairment: Word Motor Speech Errors: Unaware   GO                    Sonia Baller, MA, CCC-SLP Speech Therapy [facility] Acute Rehab

## 2020-04-03 NOTE — Progress Notes (Signed)
Physical Therapy Treatment Patient Details Name: Joann Gutierrez MRN: [id number] DOB: 1945/12/05 Today's Date: 04/03/2020    History of Present Illness 75 y.o. female presented via EMS with altered mentation with minimal response and no command follow. Admitted 03/29/20 for acute metabolic encephalopathy. CT with stable atrophy with periventricular small vessel disease, prior small infarct L cerebellum, no acute infarct. EEG moderate diffuse encephalopathy nonspecific etiology MRI revealed Multifocal acute ischemia in a deep watershed pattern, left worse than right. PMH: CKD stage V recently started HD from January 2022 after hospitalization for COVID , COPD, chronic hypoxic respite failure secondary to COPD, OSA on CPAP HS, dementia, ambulation dysfunction/bedbound, HTN, HLD,    PT Comments    Pt's daughter is present during session and eager for pt to be out of bed in recliner. Pt reluctant to participate in therapy requiring maximal encouragement. With daughter support pt finally able to manage LE off bed and bring trunk to upright with min A. Pt agreeable to get to chair only when daughter says she will give her a bath and rub her with lotion. Pt is minAx2 for standing but requires 2 attempts to come to fully upright. Pt unable to take steps but only stand long enough for the linens to be changed. Pt quickly sits back down and brings R LE into bed, requires modA for bringing L LE back into bed. D/c plans remain appropriate. PT will continue to follow acutely.     Follow Up Recommendations  SNF     Equipment Recommendations  None recommended by PT (has necessary equipment)    Recommendations for Other Services OT consult     Precautions / Restrictions Precautions Precautions: Fall    Mobility  Bed Mobility Overal bed mobility: Needs Assistance Bed Mobility: Supine to Sit;Sit to Supine     Supine to sit: Min assist Sit to supine: Mod assist   General bed mobility comments: min  A for managment of LE across bed, with increased time and encouragement able to pull her self up to seated and scoot hips towards EoB, when finished with standing sits back down and swings R LE into bed requiring modA for bringing L LE back into bed.    Transfers Overall transfer level: Needs assistance Equipment used: Rolling walker (2 wheeled) Transfers: Sit to/from Stand Sit to Stand: Min assist         General transfer comment: minAx2 for power up into standing requires increased multimodal cuing to come to standing and with first attempt unable to bring hips under her and bring trunk to upright on second attempt able to come to fully standing and daugter able to change linends  Ambulation/Gait             General Gait Details: unable         Balance Overall balance assessment: Needs assistance Sitting-balance support: No upper extremity supported;Feet supported Sitting balance-[name]: Fair Sitting balance - Comments: able to maintain sitting balance  EoB   Standing balance support: During functional activity;Bilateral upper extremity supported Standing balance-[name]: Poor Standing balance comment: requires UE support                            Cognition Arousal/Alertness: Awake/alert Behavior During Therapy: Flat affect Overall Cognitive Status: History of cognitive impairments - at baseline  General Comments: disoriented, answers to name, follows one step commands with increased time and restatement, daughter present and instrumental in encouragement for movement         General Comments General comments (skin integrity, edema, etc.): Daughter present during session, pt requiring increased encouragement for participation and mobility      Pertinent Vitals/Pain Pain Assessment: Faces Faces Pain Scale: Hurts little more Pain Location: R LE and back with movement Pain Descriptors /  Indicators: Discomfort;Grimacing;Guarding Pain Intervention(s): Limited activity within patient's tolerance;Monitored during session;Repositioned    Home Living       Type of Home: House                  PT Goals (current goals can now be found in the care plan section) Acute Rehab PT Goals Patient Stated Goal: to lay back down PT Goal Formulation: Patient unable to participate in goal setting Time For Goal Achievement: 04/14/20 Potential to Achieve Goals: Fair Progress towards PT goals: Progressing toward goals    Frequency    Min 2X/week      PT Plan Current plan remains appropriate       AM-PAC PT "6 Clicks" Mobility   Outcome Measure  Help needed turning from your back to your side while in a flat bed without using bedrails?: Total Help needed moving from lying on your back to sitting on the side of a flat bed without using bedrails?: Total Help needed moving to and from a bed to a chair (including a wheelchair)?: Total Help needed standing up from a chair using your arms (e.g., wheelchair or bedside chair)?: Total Help needed to walk in hospital room?: Total Help needed climbing 3-5 steps with a railing? : Total 6 Click Score: 6    End of Session Equipment Utilized During Treatment: Gait belt Activity Tolerance: Patient tolerated treatment well Patient left: in bed;with bed alarm set;with call bell/phone within reach;with nursing/sitter in room;with family/visitor present Nurse Communication: Mobility status PT Visit Diagnosis: Unsteadiness on feet (R26.81);Other abnormalities of gait and mobility (R26.89);Muscle weakness (generalized) (M62.81);Difficulty in walking, not elsewhere classified (R26.2)     Time: 9147-8295 PT Time Calculation (min) (ACUTE ONLY): 39 min  Charges:  $Therapeutic Activity: 38-52 mins                     Jeovanny Cuadros B. Migdalia Dk PT, DPT Acute Rehabilitation Services Pager [phone] Office [phone]    [place] 04/03/2020, 5:26 PM

## 2020-04-03 NOTE — Progress Notes (Signed)
Thank you for consult on Joann Gutierrez. Chart and therapy notes reviewed. Note that patient has been at [facility] since Jan and continues to be limited by combativeness alternating with lethargy as well as weakness. concurr with recommendations of SNF level therapy as do not feel that patient will be able to tolerate 3 hours/day intensive rehab program. 03/02/20. Will defer consult for now.

## 2020-04-03 NOTE — Progress Notes (Signed)
PROGRESS NOTE    Joann Gutierrez  JME:[id number] DOB: [birth year] DOA: 03/29/2020 PCP: Andree Moro, DO   Chief Complaint  Patient presents with  . Altered Mental Status   Brief Narrative:  BILLI BRIGHT is Joann Gutierrez 75 y.o. female with medical history significant of CKD stage V recently started HD from January 2022, COPD, chronic hypoxic respite failure secondary to COPD, OSA on CPAP HS, dementia, ambulation dysfunction/bedbound, HTN, HLD, presented with altered mentation.  At baseline, patient appears to be very talkative and responding to visitors promptly, but usually only oriented to herself and place and confused about time.  However this morning, patient was found with minimal response, awake, and confused.  Husband denies patient has any pain is until last night, no diarrhea and appears to be at her baseline otherwise.  Husband reported that patient appears to have had problems with urination " very little urine compared to before January", and patient appears to be sleepy after HD sessions (TTS), usually she will sleep the rest of the day and became more awake and next day.  Last HD was yesterday.  Husband visited her 8 PM last night, and found she was sleepy which appears to be her baseline as she just had HD in the morning. Husband reported that patient had couple bouts of diarrhea last week but resolved, no diarrhea last 2 days.  ED Course: Patient was found awake, minimal response, not following command.  CT had poor quality.  Work-up showed VBG normal pH, PCO2 59, BUN 20.  Urine analysis pending.  Assessment & Plan:   Active Problems:   AMS (altered mental status)   Acute bilat watershed infarction [facility])  Goals of care: has communicated to daughter that she'd want "everything" done.  Discussed having palliative care come onboard and assist [place] with further discussions.   Daughter open to family meeting tomorrow 3/15 with family.  Open to having palliative care present as well.    Multifocal Acute Ischemia in deep watershed pattern  Acute Metabolic Encephalopathy  Hx Dementia Sounds like she's been declining generally since COVID infection (02/15/20) Fighting the staff yesterday morning, her daughter found her down on the ground VBG without hypercarbia, normal ammonia, TSH, vitamin B12, folate EEG with moderate diffuse encephalopathy Head CT with stable atrophy with periventricular small vessel disease, prior small infarct L cerebellum, no acute infarct, no mass or hemorrhage.  Foci of arterial vascular calcification, foci of paranasal sinus disease.   MRI brain with multifocal acute ischemia in deep watershed pattern, L>R MRA brain (3/13) negative for LVO, short segment mod to severe stenosis of L PCA P2 segment, moderate to severe log segment stenosis of the distal L MCA M1 LDL 166, A1c 7.4 DAPT x 3 weeks, then plavix alone.  Atorvastatin, zetia Echo with EF 22-97%, grade 1 diastolic dysfunction, LV intracavitary gradient with obstruction (see report) Carotid dopplers with 1-39% stenosis bilaterally, antegrade flow Permissive hypertension - hold home BP meds Neurology c/s, appreciate recs - per note - ASA/plavix x 3 weeks, then plavix alone CXR without acute disease.  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 -Continue  Aricept  Chronic ambulation dysfunction/bedbound -Significant deterioration of ambulation status since January 2022. -PT evaluation  OSA -CPAP HS.  DVT prophylaxis: heparin Code Status: full  Family Communication: daughter 3/14 Disposition:   Status is: Inpatient  Remains inpatient appropriate because:Inpatient level of care appropriate due to severity of illness   Dispo: The patient is from: SNF              Anticipated d/c is to: SNF              Patient currently is not medically stable to d/c.   Difficult to place patient No   Consultants:   renal  Procedures:  none  Antimicrobials:  Anti-infectives (From admission, onward)   None     Subjective: Mostly incomprehensible or nonsensical speech   Objective: Vitals:   04/03/20 0426 04/03/20 0800 04/03/20 1200 04/03/20 1534  BP: 112/62 125/65 (!) 141/70 (!) 141/61  Pulse: 63     Resp: 13 17 14    Temp: 98.5 F (36.9 C) 98.6 F (37 C)  98.4 F (36.9 C)  TempSrc: Axillary Oral  Axillary  SpO2: 97% 98%    Weight: 103.2 kg     Height:       No intake or output data in the 24 hours ending 04/03/20 2011 Filed Weights   04/01/20 1135 04/02/20 0400 04/03/20 0426  Weight: 102.5 kg 103.2 kg 103.2 kg    Examination:  General: No acute distress. Cardiovascular: Heart sounds show Harper Smoker regular rate, and rhythm. Lungs: Clear to auscultation bilaterally  Abdomen: Soft, nontender, nondistended Neurological: Alert, but disoriented. Moves all extremities 4. Follows commands.  Difficult to understand speech.  Skin: Warm and dry. No rashes or lesions. Extremities: No clubbing or cyanosis. No edema.   Data Reviewed: I have personally reviewed following labs and imaging studies  CBC: Recent Labs  Lab 03/29/20 1223 03/29/20 1336 03/30/20 0538 03/31/20 0109 04/01/20 0809 04/02/20 0548 04/03/20 0129  WBC 10.8*  --  9.3 6.6 5.5 5.2 5.1  NEUTROABS 6.6  --   --  2.7  --  1.6* 2.2  HGB 8.4*   < > 7.8* 7.8* 7.4*  7.5* 7.6*  HCT 28.9*   < > 25.7* 26.5* 25.6* 25.3* 24.6*  MCV 81.4  --  79.6* 80.8 80.8 79.3* 77.4*  PLT 192  --  188 143* 158 141* 124*   < > = values in this interval not displayed.    Basic Metabolic Panel: Recent Labs  Lab 03/30/20 0538 03/30/20 1145 03/31/20 0109 04/01/20 0809 04/02/20 0548 04/03/20 0129  NA 137  --  141 144 139 136  K 3.8  --  4.1 3.6 2.9* 2.9*  CL 98  --  106 106 102 102  CO2 27  --  24 27 26 26   GLUCOSE 115*  --  112* 85 102* 148*  BUN 26*  --  10 23 10 17   CREATININE 8.35*  --  4.43* 7.07* 4.71* 6.35*  CALCIUM 8.5*  --  8.3* 8.1* 7.9* 7.9*  MG  --   --  2.0  --  1.9 2.1  PHOS  --  5.5* 3.7 4.7* 3.4 4.2    GFR: Estimated Creatinine Clearance: 9.1 mL/min (Nayelie Gionfriddo) (by C-G formula based on SCr of 6.35 mg/dL (H)).  Liver Function  Tests: Recent Labs  Lab 03/29/20 1223 03/31/20 0109 04/01/20 0809 04/02/20 0548 04/03/20 0129  AST 43* 38  --  29 25  ALT 12 8  --  11 11  ALKPHOS 68 60  --  53 55  BILITOT 0.6 0.4  --  1.1 0.6  PROT 7.1 6.6  --  6.7 6.8  ALBUMIN 2.9* 2.6* 2.6* 2.7* 2.7*    CBG: Recent Labs  Lab 04/02/20 1713 04/02/20 2039 04/03/20 0752 04/03/20 1210 04/03/20 1821  GLUCAP 163* 190* 110* 129* 156*     Recent Results (from the past 240 hour(s))  SARS CORONAVIRUS 2 (TAT 6-24 HRS) Nasopharyngeal Nasopharyngeal Swab     Status: None   Collection Time: 03/30/20  1:48 AM   Specimen: Nasopharyngeal Swab  Result Value Ref Range Status   SARS Coronavirus 2 NEGATIVE NEGATIVE Final    Comment: (NOTE) SARS-CoV-2 target nucleic acids are NOT DETECTED.  The SARS-CoV-2 RNA is generally detectable in upper and lower respiratory specimens during the acute phase of infection. Negative results do not preclude SARS-CoV-2 infection, do not rule out co-infections with other pathogens, and should not be used as the sole basis for treatment or other patient management decisions. Negative results must be combined with clinical  observations, patient history, and epidemiological information. The expected result is Negative.  Fact Sheet for Patients: SugarRoll.be  Fact Sheet for Healthcare Providers: https://www.woods-mathews.com/  This test is not yet approved or cleared by the [place] FDA and  has been authorized for detection and/or diagnosis of SARS-CoV-2 by FDA under an Emergency Use Authorization (EUA). This EUA will remain  in effect (meaning this test can be used) for the duration of the COVID-19 declaration under Se ction 564(b)(1) of the Act, 21 U.S.C. section 360bbb-3(b)(1), unless the authorization is terminated or revoked sooner.  Performed at [facility], [place] [address] [place], [place] [address]          Radiology Studies: MR ANGIO HEAD WO CONTRAST  Result Date: 04/02/2020 CLINICAL DATA:  75 year old female with recent altered mental status, delirium. Brain MRI 2 days ago demonstrating scattered acute, patchy bilateral cerebral hemisphere edema in Skya Mccullum deep white matter, watershed pattern. EXAM: MRA HEAD WITHOUT CONTRAST TECHNIQUE: Angiographic images of the Circle of Willis were obtained using MRA technique without intravenous contrast. COMPARISON:  Brain MRI 03/31/2020.  Head CT 03/30/2020 and earlier. FINDINGS: No intracranial mass effect or midline shift. Ventricle size and configuration appears stable. Antegrade flow in the posterior circulation with fairly codominant distal vertebral arteries. No distal vertebral stenosis. Both PICA vessels appear to remain patent. Patent vertebrobasilar junction and basilar artery with mild tortuosity. No basilar stenosis. Normal SCA and PCA origins. Small bilateral posterior communicating arteries. Right PCA branches are within normal limits. There is Gabbrielle Mcnicholas short segment moderate to severe stenosis in the left PCA P2 as seen on series 255, image 7. Preserved distal left PCA flow signal. Antegrade flow in both  ICA siphons. Tortuous right ICA just below the skull base. No siphon stenosis. Ophthalmic and posterior communicating artery origins appear normal. Patent carotid termini, MCA and ACA origins. Right A1 appears mildly dominant. Anterior communicating artery, visible bilateral ACA branches and Diera Wirkkala median artery of the corpus callosum are within normal limits. Right MCA M1 segment is patent with tortuosity but no stenosis. Patent right MCA bifurcation without stenosis. Visible right MCA branches are within normal limits. Left MCA proximal M1 is normal but there is Jamin Humphries long segment of moderate to severe  stenosis in the distal left MCA M1 as seen on series 252, image 14 and 253 image 8. This somewhat abates before the left MCA bifurcation which remains patent. Left MCA branches appear within normal limits (on source images, some artifact is noted on the horizontal MIP images). IMPRESSION: 1. Negative for large vessel occlusion. 2. Short segment moderate to severe stenosis of the left PCA P2 segment. 3. 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 Electronically Signed   By: Genevie [name] M.D.   On: 04/02/2020 10:43        Scheduled Meds: . ascorbic acid  500 mg Oral Daily  . aspirin EC  81 mg Oral Daily  . atorvastatin  40 mg Oral Daily  . calcium carbonate  1,250 mg Oral Daily  . Chlorhexidine Gluconate Cloth  6 each Topical Q0600  . clopidogrel  75 mg Oral Daily  . donepezil  5 mg Oral QHS  . doxercalciferol  3 mcg Intravenous Q T,Th,Sa-HD  . ezetimibe  10 mg Oral Daily  . fluticasone furoate-vilanterol  1 puff Inhalation Daily  . heparin  5,000 Units Subcutaneous Q12H  . insulin aspart  0-6 Units Subcutaneous TID WC  . LORazepam  0.5 mg Intravenous Once  . montelukast  10 mg Oral QHS  . pantoprazole  40 mg Oral Daily  . polyethylene glycol  17 g Oral BID  . umeclidinium bromide  1 puff Inhalation Daily   Continuous Infusions: . iron sucrose Stopped (03/31/20 0543)     LOS: 5  days    Time spent: over 48 min    Fayrene Helper, MD [facility]   To contact the attending provider between 7A-7P or the covering provider during after hours 7P-7A, please log into the web site [email or web address] and access using universal [place] password for that web site. If you do not have the password, please call the hospital operator.  04/03/2020, 8:11 PM

## 2020-04-03 NOTE — Progress Notes (Signed)
Inpatient Rehab Admissions Coordinator:   Please see note from PM&R PA, Pam Love.  Pt is not a candidate for CIR at this time.  Will sign off.   Shann Medal, PT, DPT Admissions Coordinator [phone] 04/03/20  1:23 PM

## 2020-04-03 NOTE — Progress Notes (Signed)
Pt needs to take medication. RN will put pt on cpap tonight.

## 2020-04-04 DIAGNOSIS — F0151 Vascular dementia with behavioral disturbance: Secondary | ICD-10-CM | POA: Diagnosis not present

## 2020-04-04 DIAGNOSIS — Z7189 Other specified counseling: Secondary | ICD-10-CM | POA: Diagnosis not present

## 2020-04-04 DIAGNOSIS — I10 Essential (primary) hypertension: Secondary | ICD-10-CM | POA: Diagnosis not present

## 2020-04-04 DIAGNOSIS — I6389 Other cerebral infarction: Secondary | ICD-10-CM | POA: Diagnosis not present

## 2020-04-04 LAB — CBC
HCT: 25.9 % — ABNORMAL LOW (ref 36.0–46.0)
Hemoglobin: 7.8 g/dL — ABNORMAL LOW (ref 12.0–15.0)
MCH: 23.5 pg — ABNORMAL LOW (ref 26.0–34.0)
MCHC: 30.1 g/dL (ref 30.0–36.0)
MCV: 78 fL — ABNORMAL LOW (ref 80.0–100.0)
Platelets: 123 10*3/uL — ABNORMAL LOW (ref 150–400)
RBC: 3.32 MIL/uL — ABNORMAL LOW (ref 3.87–5.11)
RDW: 21.6 % — ABNORMAL HIGH (ref 11.5–15.5)
WBC: 5.1 10*3/uL (ref 4.0–10.5)
nRBC: 0 % (ref 0.0–0.2)

## 2020-04-04 LAB — HEPATITIS B SURFACE ANTIGEN: Hepatitis B Surface Ag: NONREACTIVE

## 2020-04-04 LAB — GLUCOSE, CAPILLARY
Glucose-Capillary: 113 mg/dL — ABNORMAL HIGH (ref 70–99)
Glucose-Capillary: 117 mg/dL — ABNORMAL HIGH (ref 70–99)
Glucose-Capillary: 163 mg/dL — ABNORMAL HIGH (ref 70–99)

## 2020-04-04 LAB — RENAL FUNCTION PANEL
Albumin: 2.6 g/dL — ABNORMAL LOW (ref 3.5–5.0)
Anion gap: 11 (ref 5–15)
BUN: 25 mg/dL — ABNORMAL HIGH (ref 8–23)
CO2: 28 mmol/L (ref 22–32)
Calcium: 8.5 mg/dL — ABNORMAL LOW (ref 8.9–10.3)
Chloride: 101 mmol/L (ref 98–111)
Creatinine, Ser: 8.31 mg/dL — ABNORMAL HIGH (ref 0.44–1.00)
GFR, Estimated: 5 mL/min — ABNORMAL LOW (ref >60)
Glucose, Bld: 102 mg/dL — ABNORMAL HIGH (ref 70–99)
Phosphorus: 5.6 mg/dL — ABNORMAL HIGH (ref 2.5–4.6)
Potassium: 3.2 mmol/L — ABNORMAL LOW (ref 3.5–5.1)
Sodium: 140 mmol/L (ref 135–145)

## 2020-04-04 MED ORDER — SODIUM CHLORIDE 0.9 % IV SOLN: 100.0000 mL | INTRAVENOUS | Status: DC | PRN

## 2020-04-04 MED ORDER — LIDOCAINE HCL (PF) 1 % IJ SOLN
5.0000 mL | INTRAMUSCULAR | Status: DC | PRN
  Filled 2020-04-04: qty 5

## 2020-04-04 MED ORDER — DOXERCALCIFEROL 4 MCG/2ML IV SOLN
INTRAVENOUS | Status: AC
  Administered 2020-04-04: 3 ug via INTRAVENOUS
  Filled 2020-04-04: qty 2

## 2020-04-04 MED ORDER — LIDOCAINE-PRILOCAINE 2.5-2.5 % EX CREA
1.0000 "application " | TOPICAL_CREAM | CUTANEOUS | Status: DC | PRN
  Filled 2020-04-04: qty 5

## 2020-04-04 MED ORDER — SODIUM CHLORIDE 0.9 % IV SOLN
125.0000 mg | INTRAVENOUS | Status: DC
  Administered 2020-04-04 – 2020-04-08 (×2): 125 mg via INTRAVENOUS
  Filled 2020-04-04 (×3): qty 10

## 2020-04-04 MED ORDER — ALTEPLASE 2 MG IJ SOLR: 2.0000 mg | Freq: Once | INTRAMUSCULAR | Status: DC | PRN

## 2020-04-04 MED ORDER — HEPARIN SODIUM (PORCINE) 1000 UNIT/ML DIALYSIS
1000.0000 [IU] | INTRAMUSCULAR | Status: DC | PRN
Start: 2020-04-04 — End: 2020-04-04

## 2020-04-04 MED ORDER — SODIUM CHLORIDE 0.9 % IV SOLN
100.0000 mL | INTRAVENOUS | Status: DC | PRN
Start: 2020-04-04 — End: 2020-04-04

## 2020-04-04 MED ORDER — PENTAFLUOROPROP-TETRAFLUOROETH EX AERO: 1.0000 "application " | INHALATION_SPRAY | CUTANEOUS | Status: DC | PRN

## 2020-04-04 MED ORDER — OLANZAPINE 5 MG PO TBDP
5.0000 mg | ORAL_TABLET | Freq: Every day | ORAL | Status: DC
  Administered 2020-04-04: 5 mg via ORAL
  Filled 2020-04-04 (×2): qty 1

## 2020-04-04 MED ORDER — DARBEPOETIN ALFA 200 MCG/0.4ML IJ SOSY
200.0000 ug | PREFILLED_SYRINGE | INTRAMUSCULAR | Status: DC
  Filled 2020-04-04: qty 0.4

## 2020-04-04 NOTE — Progress Notes (Signed)
This morning RN rounded on patient to give her CHG bath and patient refused.

## 2020-04-04 NOTE — Plan of Care (Signed)

## 2020-04-04 NOTE — Progress Notes (Signed)
NA informed RN that patient will not allow her to check blood sugar and she became combative.  This RN went to check blood sugar and to give medication.  Patient refused and became combative.  This RN was concerned particularly since she did not eat much.  This RN called the daughter in anticipation that she will be able to convinced her to alow [place] to check her blood sugar and take her medication; however, patient refused.  Respiratory therapist was at bedside wanting to place patient on C-Pap and patient refused. Daughter was not successful in convincing her to take her med; however, I was able to get her blood sugar.   As long as we do not approach her to provide care, she is calm and in no acute distress.  Soon as we begin to provide care, she becomes combative.She is in bed resting.  Will continue to monitor.

## 2020-04-04 NOTE — Progress Notes (Signed)
PROGRESS NOTE    Joann Gutierrez  RKY:[id number] DOB: [birth year] DOA: 03/29/2020 PCP: Andree Moro, DO   Chief Complaint  Patient presents with  . Altered Mental Status   Brief Narrative:  Joann Gutierrez is a 75 y.o. female with medical history significant of CKD stage V recently started HD from January 2022, COPD, chronic hypoxic respite failure secondary to COPD, OSA on CPAP HS, dementia, ambulation dysfunction/bedbound, HTN, HLD, presented with altered mentation.  She was found to have multifocal ischemia with watershed infarcts.  She's had delirium and speech difficulties during her hospitalization.  She's also been poorly cooperative with care recently.  Palliative care has been consulted and currently having goals of care discussions.  Trialing zyprexa for agitation.    Assessment & Plan:   Active Problems:   AMS (altered mental status)   Acute bilat watershed infarction [facility])  Goals of care: Leslie conversation with palliative.  Extended conversation, appreciate palliative and family present - [name], [name], etc.  Discussed concerns that with continued delirium, debility, inability to cooperative with care, biggest concern is ability to continue/tolerate dialysis safely - will need to evaluate and discuss whether this is something that can be continued (versus discontinuing and pursuing comfort route).  At this time, family wants to continue trying anything reasonable to see if we can help Mrs. Sigal be more cooperative with care.  Will try zyprexa for agitation.  [name], notes concern as well about her decreased appetite and that she's not eating (chronic decreased appetite since her covid infection, worse here since stroke).  Discussed zyprexa may also help with appetite, but this is another hurdle in her care.  Family present agreed DNR appropriate, order changed to DNR.  Would recommend following her progress with medication changes and time.  Will need to continue to  discuss goals of care with family with time.   Multifocal Acute Ischemia in deep watershed pattern  Acute Metabolic Encephalopathy  Hx Dementia Sounds like she's been declining generally since COVID infection (02/15/20) Fighting the staff yesterday morning, her daughter found her down on the ground VBG without hypercarbia, normal ammonia, TSH, vitamin B12, folate EEG with moderate diffuse encephalopathy Head CT with stable atrophy with periventricular small vessel disease, prior small infarct L cerebellum, no acute infarct, no mass or hemorrhage.  Foci of arterial vascular calcification, foci of paranasal sinus disease. MRI brain with multifocal acute ischemia in deep watershed pattern, L>R MRA brain (3/13) negative for LVO, short segment mod to severe stenosis of L PCA P2 segment, moderate to severe log segment stenosis of the distal L MCA M1 LDL 166, A1c 7.4 DAPT x 3 weeks, then plavix alone.  Atorvastatin, zetia Echo with EF 31-51%, grade 1 diastolic dysfunction, LV intracavitary gradient with obstruction (see report) Carotid dopplers with 1-39% stenosis bilaterally, antegrade flow Permissive hypertension - hold home BP meds Neurology c/s, appreciate recs - per note - ASA/plavix x 3 weeks, then plavix alone CXR without acute disease.  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 -Continue Aricept - zyprexa as above  Chronic ambulation dysfunction/bedbound -Significant deterioration of ambulation status since January 2022. -PT evaluation  OSA -CPAP HS.  DVT prophylaxis: heparin Code Status: full  Family Communication: family meeting 3/15 Disposition:   Status is: Inpatient  Remains inpatient appropriate because:Inpatient level of care appropriate due to severity of illness   Dispo: The patient is from: SNF              Anticipated d/c is to: SNF              Patient currently is not medically stable to d/c.   Difficult to place patient No   Consultants:   renal  Procedures:  none  Antimicrobials:  Anti-infectives (From admission, onward)   None     Subjective: Nonsensical speech  Objective: Vitals:   04/04/20 1051 04/04/20 1147 04/04/20 1518 04/04/20 2031  BP: 127/63 105/73 139/62   Pulse: 67 74 69 68  Resp: 15 16 16    Temp: 97.9 F (36.6 C) 98.3 F (36.8 C) 98.2 F (36.8 C) 99.3 F (37.4 C)  TempSrc: Oral Axillary Oral Oral  SpO2: 98% 98% 99% 99%  Weight: 100.1 kg     Height:        Intake/Output Summary (Last 24 hours) at 04/04/2020 2037 Last data filed at 04/04/2020 1051 Gross per 24 hour  Intake -  Output 2000 ml  Net -2000 ml   Filed Weights   04/03/20 0426 04/04/20 0730 04/04/20 1051  Weight: 103.2 kg 102.1 kg 100.1 kg    Examination:  General: No acute distress. Cardiovascular: Heart sounds show a regular rate, and rhythm Lungs: Clear to auscultation bilaterally  Abdomen: Soft, nontender, nondistended Neurological: moving all extremities, difficult to understand, follows commands intermittently Skin: Warm and dry. No rashes or lesions. Extremities: No clubbing or cyanosis. No edema.    Data Reviewed: I have personally reviewed following labs and imaging studies  CBC: Recent Labs  Lab 03/29/20 1223 03/29/20 1336 03/31/20 0109 04/01/20 0809 04/02/20 0548 04/03/20 0129  04/04/20 0847  WBC 10.8*   < > 6.6 5.5 5.2 5.1 5.1  NEUTROABS 6.6  --  2.7  --  1.6* 2.2  --   HGB 8.4*   < > 7.8* 7.4* 7.5* 7.6* 7.8*  HCT 28.9*   < > 26.5* 25.6* 25.3* 24.6* 25.9*  MCV 81.4   < > 80.8 80.8 79.3* 77.4* 78.0*  PLT 192   < > 143* 158 141* 124* 123*   < > = values in this interval not displayed.    Basic Metabolic Panel: Recent Labs  Lab 03/31/20 0109 04/01/20 0809 04/02/20 0548 04/03/20 0129 04/04/20 0847  NA 141 144 139 136 140  K 4.1 3.6 2.9* 2.9* 3.2*  CL 106 106 102 102 101  CO2 24 27 26 26 28   GLUCOSE 112* 85 102* 148* 102*  BUN 10 23 10 17  25*  CREATININE 4.43* 7.07* 4.71* 6.35* 8.31*  CALCIUM 8.3* 8.1* 7.9* 7.9* 8.5*  MG 2.0  --  1.9 2.1  --   PHOS 3.7 4.7* 3.4 4.2 5.6*    GFR: Estimated Creatinine Clearance: 6.8 mL/min (A) (by C-G formula based on SCr of 8.31 mg/dL (H)).  Liver Function Tests: Recent Labs  Lab 03/29/20 1223 03/31/20 0109 04/01/20 0809 04/02/20 0548 04/03/20 0129 04/04/20 0847  AST 43* 38  --  29 25  --  ALT 12 8  --  11 11  --   ALKPHOS 68 60  --  53 55  --   BILITOT 0.6 0.4  --  1.1 0.6  --   PROT 7.1 6.6  --  6.7 6.8  --   ALBUMIN 2.9* 2.6* 2.6* 2.7* 2.7* 2.6*    CBG: Recent Labs  Lab 04/03/20 1821 04/03/20 2307 04/04/20 1150 04/04/20 1800 04/04/20 2027  GLUCAP 156* 125* 113* 117* 163*     Recent Results (from the past 240 hour(s))  SARS CORONAVIRUS 2 (TAT 6-24 HRS) Nasopharyngeal Nasopharyngeal Swab     Status: None   Collection Time: 03/30/20  1:48 AM   Specimen: Nasopharyngeal Swab  Result Value Ref Range Status   SARS Coronavirus 2 NEGATIVE NEGATIVE Final    Comment: (NOTE) SARS-CoV-2 target nucleic acids are NOT DETECTED.  The SARS-CoV-2 RNA is generally detectable in upper and lower respiratory specimens during the acute phase of infection. Negative results do not preclude SARS-CoV-2 infection, do not rule out co-infections with other pathogens, and should not be used as the sole basis for  treatment or other patient management decisions. Negative results must be combined with clinical observations, patient history, and epidemiological information. The expected result is Negative.  Fact Sheet for Patients: SugarRoll.be  Fact Sheet for Healthcare Providers: https://www.woods-mathews.com/  This test is not yet approved or cleared by the [place] FDA and  has been authorized for detection and/or diagnosis of SARS-CoV-2 by FDA under an Emergency Use Authorization (EUA). This EUA will remain  in effect (meaning this test can be used) for the duration of the COVID-19 declaration under Se ction 564(b)(1) of the Act, 21 U.S.C. section 360bbb-3(b)(1), unless the authorization is terminated or revoked sooner.  Performed at [facility], [place] [address] [place],  [address]          Radiology Studies: No results found.      Scheduled Meds: . ascorbic acid  500 mg Oral Daily  . aspirin EC  81 mg Oral Daily  . atorvastatin  40 mg Oral Daily  . calcium carbonate  1,250 mg Oral Daily  . Chlorhexidine Gluconate Cloth  6 each Topical Q0600  . clopidogrel  75 mg Oral Daily  . [START ON 04/06/2020] darbepoetin (ARANESP) injection - DIALYSIS  200 mcg Intravenous Q Thu-HD  . donepezil  5 mg Oral QHS  . doxercalciferol  3 mcg Intravenous Q T,Th,Sa-HD  . ezetimibe  10 mg Oral Daily  . fluticasone furoate-vilanterol  1 puff Inhalation Daily  . heparin  5,000 Units Subcutaneous Q12H  . insulin aspart  0-6 Units Subcutaneous TID WC  . LORazepam  0.5 mg Intravenous Once  . montelukast  10 mg Oral QHS  . OLANZapine zydis  5 mg Oral QHS  . pantoprazole  40 mg Oral Daily  . polyethylene glycol  17 g Oral BID  . umeclidinium bromide  1 puff Inhalation Daily   Continuous Infusions: . ferric gluconate (FERRLECIT/NULECIT) IV 125 mg (04/04/20 1823)     LOS: 6 days    Time spent: over 30 min    Fayrene Helper,  MD [facility]   To contact the attending provider between 7A-7P or the covering provider during after hours 7P-7A, please log into the web site [email or web address] and access using universal [place] password for that web site. If you do not have the password, please call the hospital operator.  04/04/2020, 8:37 PM

## 2020-04-04 NOTE — Progress Notes (Addendum)
[facility] Progress Note   Subjective: Seen on HD via TDC. Having to hide blood lines. Patient agitated, refusing exam.   Objective Vitals:   04/04/20 0737 04/04/20 0753 04/04/20 0824 04/04/20 0900  BP: 140/60 129/79 127/75 137/65  Pulse:      Resp:  14 17 15   Temp:      TempSrc:      SpO2:      Weight:      Height:       Physical Exam Refused physical exam    Additional Objective Labs: Basic Metabolic Panel: Recent Labs  Lab 04/01/20 0809 04/02/20 0548 04/03/20 0129  NA 144 139 136  K 3.6 2.9* 2.9*  CL 106 102 102  CO2 27 26 26   GLUCOSE 85 102* 148*  BUN 23 10 17   CREATININE 7.07* 4.71* 6.35*  CALCIUM 8.1* 7.9* 7.9*  PHOS 4.7* 3.4 4.2   Liver Function Tests: Recent Labs  Lab 03/31/20 0109 04/01/20 0809 04/02/20 0548 04/03/20 0129  AST 38  --  29 25  ALT 8  --  11 11  ALKPHOS 60  --  53 55  BILITOT 0.4  --  1.1 0.6  PROT 6.6  --  6.7 6.8  ALBUMIN 2.6* 2.6* 2.7* 2.7*   No results for input(s): LIPASE, AMYLASE in the last 168 hours. CBC: Recent Labs  Lab 03/30/20 0538 03/31/20 0109 04/01/20 0809 04/02/20 0548 04/03/20 0129  WBC 9.3 6.6 5.5 5.2 5.1  NEUTROABS  --  2.7  --  1.6* 2.2  HGB 7.8* 7.8* 7.4* 7.5* 7.6*  HCT 25.7* 26.5* 25.6* 25.3* 24.6*  MCV 79.6* 80.8 80.8 79.3* 77.4*  PLT 188 143* 158 141* 124*   Blood Culture    Component Value Date/Time   SDES IN/OUT CATH URINE 02/16/2020 1119   SPECREQUEST  02/16/2020 1119    NONE Performed at [facility] [address], [place], [place] [address]    CULT 20,000 COLONIES/mL ESCHERICHIA COLI (A) 02/16/2020 1119   REPTSTATUS 02/18/2020 FINAL 02/16/2020 1119    Cardiac Enzymes: No results for input(s): CKTOTAL, CKMB, CKMBINDEX, TROPONINI in the last 168 hours. CBG: Recent Labs  Lab 04/02/20 2039 04/03/20 0752 04/03/20 1210 04/03/20 1821 04/03/20 2307  GLUCAP 190* 110* 129* 156* 125*   Iron Studies: No results for input(s): IRON, TIBC, TRANSFERRIN, FERRITIN  in the last 72 hours. @lablastinr3 @ Studies/Results: MR ANGIO HEAD WO CONTRAST  Result Date: 04/02/2020 CLINICAL DATA:  75 year old female with recent altered mental status, delirium. Brain MRI 2 days ago demonstrating scattered acute, patchy bilateral cerebral hemisphere edema in a deep white matter, watershed pattern. EXAM: MRA HEAD WITHOUT CONTRAST TECHNIQUE: Angiographic images of the Circle of Willis were obtained using MRA technique without intravenous contrast. COMPARISON:  Brain MRI 03/31/2020.  Head CT 03/30/2020 and earlier. FINDINGS: No intracranial mass effect or midline shift. Ventricle size and configuration appears stable. Antegrade flow in the posterior circulation with fairly codominant distal vertebral arteries. No distal vertebral stenosis. Both PICA vessels appear to remain patent. Patent vertebrobasilar junction and basilar artery with mild tortuosity. No basilar stenosis. Normal SCA and PCA origins. Small bilateral posterior communicating arteries. Right PCA branches are within normal limits. There is a short segment moderate to severe stenosis in the left PCA P2 as seen on series 255, image 7. Preserved distal left PCA flow signal. Antegrade flow in both ICA siphons. Tortuous right ICA just below the skull base. No siphon stenosis. Ophthalmic and posterior communicating artery origins appear  normal. Patent carotid termini, MCA and ACA origins. Right A1 appears mildly dominant. Anterior communicating artery, visible bilateral ACA branches and a median artery of the corpus callosum are within normal limits. Right MCA M1 segment is patent with tortuosity but no stenosis. Patent right MCA bifurcation without stenosis. Visible right MCA branches are within normal limits. Left MCA proximal M1 is normal but there is a long segment of moderate to severe stenosis in the distal left MCA M1 as seen on series 252, image 14 and 253 image 8. This somewhat abates before the left MCA bifurcation which  remains patent. Left MCA branches appear within normal limits (on source images, some artifact is noted on the horizontal MIP images). IMPRESSION: 1. Negative for large vessel occlusion. 2. Short segment moderate to severe stenosis of the left PCA P2 segment. 3. 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 Electronically Signed   By: Genevie [name] M.D.   On: 04/02/2020 10:43   Medications: . sodium chloride    . sodium chloride    . iron sucrose Stopped (03/31/20 0543)   . ascorbic acid  500 mg Oral Daily  . aspirin EC  81 mg Oral Daily  . atorvastatin  40 mg Oral Daily  . calcium carbonate  1,250 mg Oral Daily  . Chlorhexidine Gluconate Cloth  6 each Topical Q0600  . clopidogrel  75 mg Oral Daily  . donepezil  5 mg Oral QHS  . doxercalciferol  3 mcg Intravenous Q T,Th,Sa-HD  . ezetimibe  10 mg Oral Daily  . fluticasone furoate-vilanterol  1 puff Inhalation Daily  . heparin  5,000 Units Subcutaneous Q12H  . insulin aspart  0-6 Units Subcutaneous TID WC  . LORazepam  0.5 mg Intravenous Once  . montelukast  10 mg Oral QHS  . pantoprazole  40 mg Oral Daily  . polyethylene glycol  17 g Oral BID  . umeclidinium bromide  1 puff Inhalation Daily     Dialysis Orders:  [place] TTS 3.5h 400/800 107kg 2/2 bath RIJ TDC Hep none - hect 3ug tiw - mircera 225 q2 , last 3/3 - venofer 50 mg per wk  Assessment/Plan: 1. AMS/Watershed distribution stroke - pt w/ underlying dementia. MRI brain with multifocal acute ischemia in watershed pattern. W/u per neuro/primary. Neurontin dc'd. More agitated today, concern for disconnecting blood lines today on HD.  2. ESRD - recent start to HD in Jan 2022. HD TTS. Next HD today. . 3. Hypokalemia. K+ 2.9 Have been supplementing K+. Unfortunately there are no 4.0 K bath available D/T national shortage. Put on 3.0 K bath. Labs are pending.  4. Recent COVID Pna admission 5. Dementia - advanced at baseline 6. HTN - cont meds as  needed. BP/volume ok. Below outpatient dry weight. 7. Chronic debility- pt is bedbound 8. OSA-per primary  9. Anemia ckd - HGB down to 7.8. Transfuse PRN. .Ordered Aranesp 200 mcg IV for  03/1720.  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 Cont VDRA  11. Goal of care: Patient remains full scope of care. Recommending Palliative Care for goals of care. Has been turned down to admission to [facility].   Jaydence Vanyo H. Zaia Carre NP-C 04/04/2020, 9:14 AM  [employer] [phone]

## 2020-04-04 NOTE — Consult Note (Signed)
Consultation Note Date: 04/04/2020   Patient Name: Joann Gutierrez  DOB: 04/08/1945  MRN: [id number]  Age / Sex: 75 y.o., female  PCP: Andree Moro, DO Referring Physician: Elodia Florence., *  Reason for Consultation: Establishing goals of care  HPI/Patient Profile: 75 y.o. female  with past medical history of ESRD on HD, dementia, CAD, admitted on 03/29/2020 with watershed infarcts resulting in AMS, worsening dementia with behavior changes. Palliative medicine consulted for Joann Gutierrez.    Clinical Assessment and Goals of Care: Evaluated patient at bedside. She was awake and alert, oriented to place and person, not to situation, not able to participate in goals of care. Met at bedside with several of [name] family, Dr. Florene Glen, and Dr. Alfonse Spruce (Palliative Intern).  Life review was conducted.  Patient's current illness and possible trajectories were discussed.  We discussed her barriers of her mental status making outpatient dialysis looking to be very difficult if not impossible.  Continued aggressive care vs transition to comfort/ hospice was discussed.  Family expressed desires for patient not to suffer.  They inquired about any interventions that may assist in her ability to participate in dialysis.  Code status was discussed- all agreed to DNR status.  At close of discussion- decision made to trial zyprexa, attempt dialysis, and monitor for outcomes.  They realize that if nephrology decides not to offer dialysis- then alternative will be comfort care and hospice.  We further discussed her long term care planning- she is in a long term bed at [facility] and this is likely where she will remain.   Primary Decision Maker HCPOA- stepdaughter- Joann Gutierrez    SUMMARY OF RECOMMENDATIONS -DNR -Zyprexa disintegrating tablet qhs -PMT will follow up on Friday or sooner if needed     Code  Status/Advance Care Planning:  DNR  Palliative Prophylaxis:   Aspiration and Delirium Protocol  Prognosis:    Unable to determine  Discharge Planning: To Be Determined  Primary Diagnoses: Present on Admission: **None**   I have reviewed the medical record, interviewed the patient and family, and examined the patient. The following aspects are pertinent.  Past Medical History:  Diagnosis Date  . Adenomatous colon polyp   . Allergy   . Anemia   . Asthma       . CAD (coronary artery disease)    Mild very minimal coronary disease with 20% obtuse marginal stenosis  . Carpal tunnel syndrome on left   . Cataract   . CHF (congestive heart failure) ([place])   . Chronic kidney disease (CKD), stage III (moderate) (HCC)    now stage 4  . COPD (chronic obstructive pulmonary disease) ([place])   . Dementia with behavioral disturbance ([place]) 03/09/2020  . Depression   . 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 Diverticulosis   . Dyspnea   . 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 Esophageal dysmotility   . Esophageal stricture   . ESRD on hemodialysis ([place]) 03/09/2020  . Gastritis   .  Gastroparesis 08/21/2007  . GERD (gastroesophageal reflux disease)   . Hearing loss of both ears   . Hernia, hiatal   . Hyperlipidemia   . Hypertension   . Morbid obesity ([place])   . OSTEOARTHRITIS 08/09/2006  . Osteoporosis   . Oxygen deficiency    prn   . 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 RENAL INSUFFICIENCY 02/16/2009  . 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 Seizures ([place])    pt thinks it has been several monthes since she had a seisure  . Shingles   . Sickle cell trait ([place])   . Sleep apnea    uses cpap  . Stroke [facility])    Social History   Socioeconomic History  . Marital status: Widowed    Spouse name: Not on file  . Number of children: 2  . Years of education: Not on file  . Highest education level: Not on file  Occupational History  . Occupation: Retired     Fish farm manager: UNEMPLOYED  Tobacco Use  . Smoking status: Former Smoker    Packs/day: 0.50    Years: 10.00    Pack years: 5.00    Quit date: 04/18/1980    Years since quitting: 39.9  . Smokeless tobacco: Never Used  Vaping Use  . Vaping Use: Never used  Substance and Sexual Activity  . Alcohol use: No    Alcohol/week: 0.0 standard drinks  . Drug use: No  . Sexual activity: Not Currently    Birth control/protection: Post-menopausal    Comment: Hysterectomy  Other Topics Concern  . Not on file  Social History Narrative   Previously worked as a Electrical engineer.   Daily Caffeine Use-Coffee and Tea   Lives with a friend who is her care giver, has home health nurse come out once a week.  She has family in town- daughter, grand daughter.    Social Determinants of Health   Financial Resource Strain: Not on file  Food Insecurity: Not on file  Transportation Needs: Not on file  Physical Activity: Not on file  Stress: Not on file  Social Connections: Not on file   Scheduled Meds: . ascorbic acid  500 mg Oral Daily  . aspirin EC  81 mg Oral Daily  . atorvastatin  40 mg Oral Daily  . calcium carbonate  1,250 mg Oral Daily  . Chlorhexidine Gluconate Cloth  6 each Topical Q0600  . clopidogrel  75 mg Oral Daily  . [START ON 04/06/2020] darbepoetin (ARANESP) injection - DIALYSIS  200 mcg Intravenous Q Thu-HD  . donepezil  5 mg Oral QHS  . doxercalciferol  3 mcg Intravenous Q T,Th,Sa-HD  . ezetimibe  10 mg Oral Daily  . fluticasone furoate-vilanterol  1 puff Inhalation Daily  . heparin  5,000 Units Subcutaneous Q12H  . insulin aspart  0-6 Units Subcutaneous TID WC  . LORazepam  0.5 mg Intravenous Once  . montelukast  10 mg Oral QHS  . pantoprazole  40 mg Oral Daily  . polyethylene glycol  17 g Oral BID  . umeclidinium bromide  1 puff Inhalation Daily   Continuous Infusions: . ferric gluconate (FERRLECIT/NULECIT) IV     PRN Meds:.acetaminophen, albuterol, azelastine, bisacodyl,  guaiFENesin-dextromethorphan, Ipratropium-Albuterol, Muscle Rub, polyethylene glycol Medications Prior to Admission:  Prior to Admission medications   Medication Sig Start Date End Date Taking? Authorizing Provider  acetaminophen (TYLENOL) 325 MG tablet Take 650 mg by mouth every 4 (four) hours as needed for moderate pain.   Yes [provider]  albuterol (VENTOLIN HFA) 108 (90 Base) MCG/ACT inhaler Inhale 2 puffs into the lungs every 4 (four) hours as needed for wheezing or shortness of breath. 03/02/20  Yes Sheikh, Omair Latif, DO  amLODipine (NORVASC) 5 MG tablet Take 1 tablet (5 mg total) by mouth every evening. 03/02/20  Yes Sheikh, Omair Latif, DO  ascorbic acid (VITAMIN C) 500 MG tablet Take 1 tablet (500 mg total) by mouth daily. 03/03/20  Yes Sheikh, Omair Latif, DO  aspirin EC 81 MG tablet Take 81 mg by mouth daily.   Yes [provider]  azelastine (ASTELIN) 0.1 % nasal spray Place 1 spray into both nostrils 2 (two) times daily as needed (allergies). Use in each nostril as directed 11/10/19  Yes [name], MD  bisacodyl (DULCOLAX) 5 MG EC tablet Take 1 tablet (5 mg total) by mouth daily as needed for moderate constipation. 03/12/20  Yes Debbe Odea, MD  calcium carbonate (OSCAL) 1500 (600 Ca) MG TABS tablet Take 1 tablet by mouth daily.   Yes [provider]  carvedilol (COREG) 25 MG tablet Take 25 mg by mouth 2 (two) times daily. 01/09/19  Yes [provider]  cholecalciferol (VITAMIN D) 25 MCG tablet Take 1 tablet (1,000 Units total) by mouth daily. 03/03/20  Yes Sheikh, Omair Latif, DO  donepezil (ARICEPT) 5 MG tablet Take 1 tablet (5 mg total) by mouth at bedtime. 03/02/20  Yes Sheikh, Omair Latif, DO  ezetimibe (ZETIA) 10 MG tablet Take 10 mg by mouth daily.   Yes [provider]  ferrous sulfate 325 (65 FE) MG tablet Take 325 mg by mouth daily.   Yes [provider]  Fluticasone-Umeclidin-Vilant (TRELEGY ELLIPTA) 100-62.5-25  MCG/INH AEPB Take 1 puff by mouth daily. 04/13/19  Yes Lauraine Rinne, NP  gabapentin (NEURONTIN) 100 MG capsule Take 1 capsule (100 mg total) by mouth 2 (two) times daily. 09/12/17  Yes Hongalgi, Lenis Dickinson, MD  guaiFENesin-dextromethorphan (ROBITUSSIN DM) 100-10 MG/5ML syrup Take 10 mLs by mouth every 4 (four) hours as needed for cough. 03/02/20  Yes Sheikh, Omair Latif, DO  hydrALAZINE (APRESOLINE) 100 MG tablet Take 1 tablet by mouth 3 (three) times daily. 05/10/19  Yes [provider]  insulin aspart (NOVOLOG) 100 UNIT/ML injection Inject 2-10 Units into the skin 3 (three) times daily before meals. 0-59 = notify MD 60-150 = 0 units 151-199 = 2 units 200-249 = 4 units 250-299 = 6 units 300-349 = 8 units 350-399 = 10 units [phone] = notify MD   Yes [provider]  insulin glargine (LANTUS) 100 UNIT/ML Solostar Pen Inject 10 Units into the skin daily.   Yes [provider]  Ipratropium-Albuterol (COMBIVENT) 20-100 MCG/ACT AERS respimat Inhale 1 puff into the lungs every 6 (six) hours as needed for wheezing or shortness of breath. 03/02/20  Yes Sheikh, Omair Latif, DO  lip balm (CARMEX) ointment Apply topically as needed for lip care. 03/02/20  Yes Sheikh, Omair Latif, DO  montelukast (SINGULAIR) 10 MG tablet Take 1 tablet (10 mg total) by mouth daily. 10/22/17  Yes Lauraine Rinne, NP  nitroGLYCERIN (NITROSTAT) 0.4 MG SL tablet PLACE ONE TABLET UNDER THE TONGUE EVERY FIVE MINUTES AS NEEDED FOR CHEST PAIN Patient taking differently: Place 0.4 mg under the tongue every 5 (five) minutes as needed for chest pain. 07/08/16  Yes Minus Breeding, MD  Nutritional Supplements (NEPRO) LIQD Take 237 mLs by mouth 2 (two) times daily. For caloric/protein   Yes [provider]  omeprazole (PRILOSEC) 40  MG capsule Take 1 capsule (40 mg total) by mouth in the morning and at bedtime. Please schedule a yearly follow up: [phone]. Thank you 09/01/19  Yes Armbruster, Carlota Raspberry, MD  OXYGEN  Inhale 2 L into the lungs as needed. CPAP with oxygen at bedtime   Yes [provider]  polyethylene glycol (MIRALAX) 17 g packet Take 17 g by mouth daily as needed. Patient taking differently: Take 17 g by mouth daily as needed (constipation.). 06/18/18  Yes Armbruster, Carlota Raspberry, MD  Propylene Glycol (SYSTANE BALANCE) 0.6 % SOLN Place 1 drop into both eyes daily as needed (dry eyes).   Yes [provider]  TRUEPLUS PEN NEEDLES 32G X 4 MM MISC INJECT THREE TIMES A DAY AS DIRECTED 07/08/17  Yes [provider]  zinc sulfate 220 (50 Zn) MG capsule Take 1 capsule (220 mg total) by mouth daily. 03/03/20  Yes Sheikh, Omair Latif, DO   Allergies  Allergen Reactions  . Morphine And Related Other (See Comments)    Family request not to be given, reports pt does not wake up when given   . Promethazine Hcl Other (See Comments)    REACTION: lethargy   Review of Systems  Unable to perform ROS: Dementia    Physical Exam Vitals and nursing note reviewed.  Pulmonary:     Effort: Pulmonary effort is normal.  Neurological:     Mental Status: She is alert. Mental status is at baseline.  Psychiatric:     Comments: Judgement impaired     Vital Signs: BP 105/73 (BP Location: Left Arm)   Pulse 74   Temp 98.3 F (36.8 C) (Axillary)   Resp 16   Ht '5\' 4"'  (1.626 m)   Wt 100.1 kg   SpO2 98%   BMI 37.88 kg/m  Pain Scale: Faces POSS *See Group Information*: 1-Acceptable,Awake and alert Pain Score: 0-No pain   SpO2: SpO2: 98 % O2 Device:SpO2: 98 % O2 Flow Rate: .   IO: Intake/output summary:   Intake/Output Summary (Last 24 hours) at 04/04/2020 1311 Last data filed at 04/04/2020 1051 Gross per 24 hour  Intake --  Output 2000 ml  Net -2000 ml    LBM: Last BM Date:  (prior to arrival) Baseline Weight: Weight: 109 kg Most recent weight: Weight: 100.1 kg      Palliative Assessment/Data: PPS: 20%      Thank you for this consult. Palliative medicine will continue  to follow and assist as needed.   Time In: 1532 Time Out: 1706 Time Total: 86 mins Greater than 50%  of this time was spent counseling and coordinating care related to the above assessment and plan.  Signed by: [name], AGNP-C Palliative Medicine    Please contact Palliative Medicine Team phone at [phone] for questions and concerns.  For individual provider: See Shea Evans

## 2020-04-05 DIAGNOSIS — Z515 Encounter for palliative care: Secondary | ICD-10-CM | POA: Diagnosis not present

## 2020-04-05 DIAGNOSIS — E119 Type 2 diabetes mellitus without complications: Secondary | ICD-10-CM | POA: Diagnosis not present

## 2020-04-05 DIAGNOSIS — Z7189 Other specified counseling: Secondary | ICD-10-CM | POA: Diagnosis not present

## 2020-04-05 DIAGNOSIS — I6389 Other cerebral infarction: Secondary | ICD-10-CM | POA: Diagnosis not present

## 2020-04-05 DIAGNOSIS — F0151 Vascular dementia with behavioral disturbance: Secondary | ICD-10-CM | POA: Diagnosis not present

## 2020-04-05 DIAGNOSIS — N186 End stage renal disease: Secondary | ICD-10-CM | POA: Diagnosis not present

## 2020-04-05 LAB — COMPREHENSIVE METABOLIC PANEL
ALT: 8 U/L (ref 0–44)
AST: 17 U/L (ref 15–41)
Albumin: 2.7 g/dL — ABNORMAL LOW (ref 3.5–5.0)
Alkaline Phosphatase: 55 U/L (ref 38–126)
Anion gap: 11 (ref 5–15)
BUN: 12 mg/dL (ref 8–23)
CO2: 25 mmol/L (ref 22–32)
Calcium: 8.4 mg/dL — ABNORMAL LOW (ref 8.9–10.3)
Chloride: 101 mmol/L (ref 98–111)
Creatinine, Ser: 5.65 mg/dL — ABNORMAL HIGH (ref 0.44–1.00)
GFR, Estimated: 7 mL/min — ABNORMAL LOW (ref >60)
Glucose, Bld: 125 mg/dL — ABNORMAL HIGH (ref 70–99)
Potassium: 3.3 mmol/L — ABNORMAL LOW (ref 3.5–5.1)
Sodium: 137 mmol/L (ref 135–145)
Total Bilirubin: 0.9 mg/dL (ref 0.3–1.2)
Total Protein: 7 g/dL (ref 6.5–8.1)

## 2020-04-05 LAB — CBC WITH DIFFERENTIAL/PLATELET
Abs Immature Granulocytes: 0.09 10*3/uL — ABNORMAL HIGH (ref 0.00–0.07)
Basophils Absolute: 0 10*3/uL (ref 0.0–0.1)
Basophils Relative: 0 %
Eosinophils Absolute: 0 10*3/uL (ref 0.0–0.5)
Eosinophils Relative: 0 %
HCT: 27.5 % — ABNORMAL LOW (ref 36.0–46.0)
Hemoglobin: 8.2 g/dL — ABNORMAL LOW (ref 12.0–15.0)
Immature Granulocytes: 1 %
Lymphocytes Relative: 47 %
Lymphs Abs: 2.9 10*3/uL (ref 0.7–4.0)
MCH: 23.3 pg — ABNORMAL LOW (ref 26.0–34.0)
MCHC: 29.8 g/dL — ABNORMAL LOW (ref 30.0–36.0)
MCV: 78.1 fL — ABNORMAL LOW (ref 80.0–100.0)
Monocytes Absolute: 1.9 10*3/uL — ABNORMAL HIGH (ref 0.1–1.0)
Monocytes Relative: 29 %
Neutro Abs: 1.5 10*3/uL — ABNORMAL LOW (ref 1.7–7.7)
Neutrophils Relative %: 23 %
Platelets: 115 10*3/uL — ABNORMAL LOW (ref 150–400)
RBC: 3.52 MIL/uL — ABNORMAL LOW (ref 3.87–5.11)
RDW: 21.2 % — ABNORMAL HIGH (ref 11.5–15.5)
WBC: 6.4 10*3/uL (ref 4.0–10.5)
nRBC: 0 % (ref 0.0–0.2)

## 2020-04-05 LAB — GLUCOSE, CAPILLARY
Glucose-Capillary: 112 mg/dL — ABNORMAL HIGH (ref 70–99)
Glucose-Capillary: 139 mg/dL — ABNORMAL HIGH (ref 70–99)
Glucose-Capillary: 203 mg/dL — ABNORMAL HIGH (ref 70–99)
Glucose-Capillary: 143 mg/dL — ABNORMAL HIGH (ref 70–99)

## 2020-04-05 LAB — MAGNESIUM: Magnesium: 2.1 mg/dL (ref 1.7–2.4)

## 2020-04-05 LAB — PHOSPHORUS: Phosphorus: 4.1 mg/dL (ref 2.5–4.6)

## 2020-04-05 MED ORDER — SODIUM CHLORIDE 0.9 % IV BOLUS
250.0000 mL | Freq: Once | INTRAVENOUS | Status: AC
  Administered 2020-04-05: 250 mL via INTRAVENOUS

## 2020-04-05 MED ORDER — OLANZAPINE 5 MG PO TBDP
5.0000 mg | ORAL_TABLET | Freq: Every day | ORAL | Status: DC
  Administered 2020-04-06: 5 mg via ORAL
  Filled 2020-04-05 (×4): qty 1

## 2020-04-05 MED ORDER — POTASSIUM CHLORIDE CRYS ER 20 MEQ PO TBCR
20.0000 meq | EXTENDED_RELEASE_TABLET | Freq: Once | ORAL | Status: DC
  Filled 2020-04-05: qty 1

## 2020-04-05 NOTE — Progress Notes (Signed)
RT note. Asked pt. If she would like to wear CPAP tonight. Pt. Refused tonight but said she would try tomorrow night. CPAP in room, RT will place pt. On CPAP if she decides to change her mind.

## 2020-04-05 NOTE — Progress Notes (Signed)
[facility] Progress Note   Subjective: Seen in room, eating chili dog and actually looks happy.   Objective Vitals:   04/05/20 0434 04/05/20 0438 04/05/20 0441 04/05/20 0654  BP: (!) 102/43 (!) 89/40 (!) 94/49 (!) 102/53  Pulse:      Resp:      Temp:      TempSrc:      SpO2:      Weight:      Height:       Physical Exam General: Chronically ill appearing female ion NAD Heart: S1,S2 RRR Lungs: CTAB Abdomen: S, NT Extremities: No LE Edema.  Dialysis Access: [facility] drsg CDI.    Additional Objective Labs: Basic Metabolic Panel: Recent Labs  Lab 04/03/20 0129 04/04/20 0847 04/05/20 0201  NA 136 140 137  K 2.9* 3.2* 3.3*  CL 102 101 101  CO2 26 28 25   GLUCOSE 148* 102* 125*  BUN 17 25* 12  CREATININE 6.35* 8.31* 5.65*  CALCIUM 7.9* 8.5* 8.4*  PHOS 4.2 5.6* 4.1   Liver Function Tests: Recent Labs  Lab 04/02/20 0548 04/03/20 0129 04/04/20 0847 04/05/20 0201  AST 29 25  --  17  ALT 11 11  --  8  ALKPHOS 53 55  --  55  BILITOT 1.1 0.6  --  0.9  PROT 6.7 6.8  --  7.0  ALBUMIN 2.7* 2.7* 2.6* 2.7*   No results for input(s): LIPASE, AMYLASE in the last 168 hours. CBC: Recent Labs  Lab 04/01/20 0809 04/02/20 0548 04/03/20 0129 04/04/20 0847 04/05/20 0201  WBC 5.5 5.2 5.1 5.1 6.4  NEUTROABS  --  1.6* 2.2  --  1.5*  HGB 7.4* 7.5* 7.6* 7.8* 8.2*  HCT 25.6* 25.3* 24.6* 25.9* 27.5*  MCV 80.8 79.3* 77.4* 78.0* 78.1*  PLT 158 141* 124* 123* 115*   Blood Culture    Component Value Date/Time   SDES IN/OUT CATH URINE 02/16/2020 1119   SPECREQUEST  02/16/2020 1119    NONE Performed at [facility] [address], [place], [place] [address]    CULT 20,000 COLONIES/mL ESCHERICHIA COLI (A) 02/16/2020 1119   REPTSTATUS 02/18/2020 FINAL 02/16/2020 1119    Cardiac Enzymes: No results for input(s): CKTOTAL, CKMB, CKMBINDEX, TROPONINI in the last 168 hours. CBG: Recent Labs  Lab 04/03/20 2307 04/04/20 1150 04/04/20 1800 04/04/20 2027  04/05/20 0744  GLUCAP 125* 113* 117* 163* 112*   Iron Studies: No results for input(s): IRON, TIBC, TRANSFERRIN, FERRITIN in the last 72 hours. @lablastinr3 @ Studies/Results: No results found. Medications: . ferric gluconate (FERRLECIT/NULECIT) IV 125 mg (04/04/20 1823)   . ascorbic acid  500 mg Oral Daily  . aspirin EC  81 mg Oral Daily  . atorvastatin  40 mg Oral Daily  . calcium carbonate  1,250 mg Oral Daily  . Chlorhexidine Gluconate Cloth  6 each Topical Q0600  . clopidogrel  75 mg Oral Daily  . [START ON 04/06/2020] darbepoetin (ARANESP) injection - DIALYSIS  200 mcg Intravenous Q Thu-HD  . donepezil  5 mg Oral QHS  . doxercalciferol  3 mcg Intravenous Q T,Th,Sa-HD  . ezetimibe  10 mg Oral Daily  . fluticasone furoate-vilanterol  1 puff Inhalation Daily  . heparin  5,000 Units Subcutaneous Q12H  . insulin aspart  0-6 Units Subcutaneous TID WC  . LORazepam  0.5 mg Intravenous Once  . montelukast  10 mg Oral QHS  . OLANZapine zydis  5 mg Oral QHS  . pantoprazole  40 mg Oral  Daily  . polyethylene glycol  17 g Oral BID  . umeclidinium bromide  1 puff Inhalation Daily     Dialysis Orders:  [place] TTS 3.5h 400/800 107kg 2/2 bath RIJ TDC Hep none - hect 3ug tiw - mircera 225 q2 , last 3/3 - venofer 50 mg per wk  Assessment/Plan: 1. AMS/Watershed distribution stroke - pt w/ underlying dementia. MRI brain with multifocal acute ischemia in watershed pattern. W/u per neuro/primary. Neurontin dc'd. Slightly more cooperative today as long as family is present.  2. ESRD - recent start to HD in Jan 2022. HD TTS. Next HD 04/05/20 3. Hypokalemia. K+ 2.9 Have been supplementing K+. Unfortunately there are no 4.0 K bath available D/T national shortage. Put on 3.0 K bath. K+ 3.3 today. Give K Dur 20 MEQ X 1 dose today.  4. Recent COVID Pna admission 5. Dementia - advanced at baseline 6. HTN - cont meds as needed. BP/volume ok. Below outpatient dry weight.Lower on DC.   7. Chronic debility- pt is bedbound 8. OSA-per primary  9. Anemia ckd -HGB down to 8.2. Transfuse PRN. .Ordered Aranesp 200 mcg IV for  03/1720.  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 Cont VDRA 11. Goal of care: Palliative care meeting 03/15. Now DNR but continue full scope of care including hemodialysis. May require sitter in HD unit if agitation persists.   Zayley Arras H. Rayburn Mundis NP-C 04/05/2020, 12:16 PM  [employer] [phone]

## 2020-04-05 NOTE — Progress Notes (Signed)
  Speech Language Pathology Treatment: Cognitive-Linquistic (Aphasia)  Patient Details Name: Joann Gutierrez MRN: [id number] DOB: 01/21/1946 Today's Date: 04/05/2020 Time: 7517-0017 SLP Time Calculation (min) (ACUTE ONLY): 11.02 min  Assessment / Plan / Recommendation Clinical Impression  Pt was seen for treatment. Pt's RN reported that the pt's participation in tasks has been poor. Pt opened her eyes with verbal stimulation and kept them open, but did not verbalize throughout the session. She vocalized "ummhmm" and "uhuh" in response to some questions, but with 0% accuracy. Pt did not complete automatic sequences despite cues and prompts. Non-verbal receptive language tasks were attempted, but pt did not complete any despite cueing. The session was ultimately terminated prematurely when the pt stopped opening her eyes or acknowledging the SLP. Vitals were stable compared to that noted upon SLP's entry. SLP will continue to follow pt.    HPI HPI: 75 y.o. female presented via EMS with altered mentation with minimal response and no command follow. Admitted 03/29/20 for acute metabolic encephalopathy. CT with stable atrophy with periventricular small vessel disease, prior small infarct L cerebellum, no acute infarct. EEG moderate diffuse encephalopathy nonspecific etiology MRI revealed Multifocal acute ischemia in a deep watershed pattern, left worse than right. PMH: CKD stage V recently started HD from January 2022 after hospitalization for COVID , COPD, chronic hypoxic respite failure secondary to COPD, OSA on CPAP HS, dementia, ambulation dysfunction/bedbound, HTN, HLD      SLP Plan  Continue with current plan of care       Recommendations                   Follow up Recommendations: Skilled Nursing facility;24 hour supervision/assistance SLP Visit Diagnosis: Aphasia (R47.01);Dysarthria and anarthria (R47.1);Cognitive communication deficit (R41.841) Plan: Continue with current plan of  care       Aloria Looper I. Hardin Negus, [place], [place] Office number [phone] Pager [phone]                Horton Marshall 04/05/2020, 5:32 PM

## 2020-04-05 NOTE — Care Management Important Message (Signed)
Important Message  Patient Details  Name: Joann Gutierrez MRN: [id number] Date of Birth: 04/03/1945   Medicare Important Message Given:  Yes - Important Message mailed due to current National Emergency   Verbal consent obtained due to current National Emergency  Relationship to patient: Self Contact Name: [place] Call Date: 04/05/20  Time: 1433 Phone: [phone] Outcome: No Answer/Busy Important Message mailed to: Patient address on file    Delorse Lek 04/05/2020, 2:33 PM

## 2020-04-05 NOTE — Progress Notes (Signed)
Daily Progress Note   Patient Name: Joann Gutierrez       Date: 04/05/2020 DOB: 1945/02/26  Age: 75 y.o. MRN#: [id number] Attending Physician: Jonetta Osgood, MD Primary Care Physician: Andree Moro, DO Admit Date: 03/29/2020  Reason for Consultation/Follow-up: Establishing goals of care  Subjective: Patient is seen at bedside and was eating lunch.  She has some issues with feeding herself but manage to put food in her mouth with a spoon in her finger. Patient declines using her denture.  We offered to help but patient states that she tried to eat her on her own.  We then helped cut of the food into smaller pieces.   We called and talked to [facility].  We updated her with how patient is doing.  She asked if we can give the Zyprexa earlier than bedtime.    [name] will send [place] the POA paperwork via fax and email.   Review of Systems  Constitutional: Negative.   Respiratory: Negative.  Negative for shortness of breath.   Cardiovascular: Negative.     Length of Stay: 7  Current Medications: Scheduled Meds:  . ascorbic acid  500 mg Oral Daily  . aspirin EC  81 mg Oral Daily  . atorvastatin  40 mg Oral Daily  . calcium carbonate  1,250 mg Oral Daily  . Chlorhexidine Gluconate Cloth  6 each Topical Q0600  . clopidogrel  75 mg Oral Daily  . [START ON 04/06/2020] darbepoetin (ARANESP) injection - DIALYSIS  200 mcg Intravenous Q Thu-HD  . donepezil  5 mg Oral QHS  . doxercalciferol  3 mcg Intravenous Q T,Th,Sa-HD  . ezetimibe  10 mg Oral Daily  . fluticasone furoate-vilanterol  1 puff Inhalation Daily  . heparin  5,000 Units Subcutaneous Q12H  . insulin aspart  0-6 Units Subcutaneous TID WC  . LORazepam  0.5 mg Intravenous Once  . montelukast  10 mg Oral QHS  . OLANZapine zydis  5  mg Oral QHS  . pantoprazole  40 mg Oral Daily  . polyethylene glycol  17 g Oral BID  . potassium chloride  20 mEq Oral Once  . umeclidinium bromide  1 puff Inhalation Daily    Continuous Infusions: . ferric gluconate (FERRLECIT/NULECIT) IV 125 mg (04/04/20 1823)    PRN Meds: acetaminophen, albuterol, azelastine, bisacodyl, guaiFENesin-dextromethorphan,  Ipratropium-Albuterol, Muscle Rub, polyethylene glycol  Physical Exam Constitutional:      General: She is not in acute distress.    Comments: Alert, awake, pleasantly confused.  Her speech is understandable  HENT:     Head: Normocephalic.  Skin:    General: Skin is warm.  Neurological:     Mental Status: She is alert.             Vital Signs: BP (!) 102/53   Pulse 63   Temp (!) 97.2 F (36.2 C) (Axillary)   Resp 20   Ht 5\' 4"  (1.626 m)   Wt 100.1 kg   SpO2 100%   BMI 37.88 kg/m  SpO2: SpO2: 100 % O2 Device: O2 Device: CPAP O2 Flow Rate:    Intake/output summary: No intake or output data in the 24 hours ending 04/05/20 1335 LBM: Last BM Date:  (PTA) Baseline Weight: Weight: 109 kg Most recent weight: Weight: 100.1 kg       Palliative Assessment/Data: 20%      Patient Active Problem List   Diagnosis Date Noted  . Acute bilat watershed infarction [facility]) 03/31/2020  . AMS (altered mental status) 03/29/2020  . Acute metabolic encephalopathy 16/10/9602  . ESRD on hemodialysis ([place]) 03/09/2020  . Thrombocytopenia ([place]) 03/09/2020  . Dementia with behavioral disturbance ([place]) 03/09/2020  . COVID-19   . SOB (shortness of breath)   . Type 2 diabetes mellitus with hyperlipidemia ([place])   . Sepsis ([place]) 02/16/2020  . Acute on chronic respiratory failure with hypoxia ([employer]) 02/16/2020  . Edema of left lower leg 02/16/2020  . Pneumonia due to COVID-19 virus 02/15/2020  . Acute on chronic diastolic HF (heart failure) ([place]) 11/18/2019  . Dyslipidemia 11/18/2019  . Polypharmacy 04/13/2019  . Healthcare maintenance  10/15/2017  . ARF (acute renal failure) ([place]) 09/07/2017  . Chest pain 09/05/2017  . Ineffective health maintenance 06/26/2017  . Medication management 06/26/2017  . Retinal edema 10/07/2016  . Postherpetic neuralgia 12/21/2015  . Herpes zoster with complication 54/09/8117  . Cellulitis of face 11/27/2015  . Anemia of chronic disease 08/17/2015  . History of colonic polyps   . Hypertensive emergency 05/20/2015  . Hypokalemia 05/20/2015  . AKI (acute kidney injury) ([place])   . Esophageal dysmotility 03/01/2015  . Throat congestion 10/11/2014  . Morbid obesity due to excess calories ([place]) 09/23/2014  . Acute asthmatic bronchitis 05/20/2014  . Abdominal pain, other specified site 09/13/2013  . Elevated liver enzymes 09/13/2013  . Cough 05/05/2013  . Pain in lower limb 03/05/2013  . DOE (dyspnea on exertion) 02/03/2013  . Chronic respiratory failure ([place]) 12/13/2012  . Stricture and stenosis of esophagus 08/31/2012  . Onychomycosis 06/01/2012  . Pain in joint, ankle and foot 06/01/2012  . Diabetes mellitus type 2 in obese ([place]) 04/29/2012  . Essential hypertension 07/19/2011  . CKD (chronic kidney disease), stage III ([place]) 05/19/2011  . Family history of malignant neoplasm of gastrointestinal tract 10/30/2010  . Bloating 10/16/2010  . Epigastric pain 10/16/2010  . Foot pain 09/25/2010  . Dysphagia 07/16/2010  . GERD (gastroesophageal reflux disease) 07/16/2010  . Microcytic anemia 07/16/2010  . Obstructive sleep apnea 06/16/2009  . HYPERCHOLESTEROLEMIA 02/16/2009  . 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 Gastroparesis 08/21/2007  . Chronic obstructive asthma ([place]) 08/09/2006    Palliative Care Assessment & Plan   Patient Profile: 75 y.o. female  with past medical history of ESRD on HD, dementia, CAD, admitted on 03/29/2020 with watershed infarcts resulting in AMS, worsening dementia  with behavior changes. Palliative medicine consulted for [place].     Assessment/Recommendations/Plan  Plan to have dialysis tomorrow.  Hopefully she can tolerate HD without agitation. We will revisit goal of care conversation with family if patient does not tolerate HD.  Family did mention that patient would not want to be on dialysis.  Change Zyprexa administration time to 6 PM CODE STATUS-DNR  Goals of Care and Additional Recommendations: Limitations on Scope of Treatment: Avoid Hospitalization  Code Status: DNR  Prognosis:  Unable to determine  Discharge Planning: To Be Determined   Thank you for allowing the Palliative Medicine Team to assist in the care of this patient.   Time In: 1:10 Time Out: 1:45 Total Time 35 min Prolonged Time Billed  no       Greater than 50%  of this time was spent counseling and coordinating care related to the above assessment and plan.  [name], AGNP-C Palliative Medicine  Gaylan Gerold, DO Internal Medicine   Please contact Palliative Medicine Team phone at [phone] for questions and concerns.

## 2020-04-05 NOTE — Progress Notes (Signed)
PROGRESS NOTE        PATIENT DETAILS Name: Joann Gutierrez Age: 75 y.o. Sex: female Date of Birth: 05-Nov-1945 Admit Date: 03/29/2020 Admitting Physician Lequita Halt, MD QIW:LNLGX, Lucila [place], DO  Brief Narrative: Patient is a 75 y.o. female ESRD, OSA on CPAP, COPD, dementia, chronic ambulatory dysfunction/bedbound status-with ongoing failure to thrive syndrome since January 2022- presented with altered mental status-she was found to have CVA.  See below for further details  Significant events: 3/9>> admit for altered mental status  Significant studies: 3/11>> MRI brain: Multifocal infarcts-left>> right 3/11>> Echo: EF 21-19%, grade 1 diastolic dysfunction.  RVSP 46.9. 3/11>> carotid Doppler: No major stenosis 3/13>> MRI brain: NO large vessel occlusion-moderate to severe left PCA P2-distal MCA M1 stenosis 3/13>> A1c 6.1 3/13>> LDL 123  Antimicrobial therapy: None  Microbiology data: None  Procedures : None  Consults: Neurology, palliative care  DVT Prophylaxis : Place and maintain sequential compression device Start: 03/30/20 1639 heparin injection 5,000 Units Start: 03/29/20 2200   Subjective: Seen earlier this morning-was on CPAP-awakes-follows some commands but still pretty confused.  Assessment/Plan: Acute CVA: Remains confused-able to move all 4 extremities-evaluated by neurology with recommendations to continue aspirin/Plavix for 3 weeks-followed by Plavix alone.  ESRD: On HD-nephrology following-given mental status issues-HD may be challenging.  Will need to discuss with nephrology going forward.  Anemia: Due to ESRD-no evidence of blood loss-follow hemoglobin.  Watch closely.  Advanced dementia with delirium: Continue Aricept-on Zyprexa.  Expect patient to be confused as long as she remains hospitalized.  Frailty/failure to thrive syndrome/chronic ambulatory dysfunction/bedbound status: Per prior documentation-significant  deterioration in her functional status since January 2022.  Palliative care-prior MD had a family meeting on 3/15-now DNR.  Agree that her overall prognosis is poor-and may benefit from transitioning to full comfort care status-we will await further recommendations from palliative care.  DM-2: CBG stable-continue SSI  Recent Labs    04/04/20 2027 04/05/20 0744 04/05/20 1232  GLUCAP 163* 112* 139*   OSA: CPAP nightly  Obesity: Estimated body mass index is 37.88 kg/m as calculated from the following:   Height as of this encounter: 5\' 4"  (1.626 m).   Weight as of this encounter: 100.1 kg.    Diet: Diet Order            Diet renal with fluid restriction Fluid restriction: 1200 mL Fluid; Room service appropriate? No; Fluid consistency: Thin  Diet effective now                  Code Status: DNR  Family Communication: Spoke with son-[name]-over phone  Disposition Plan: Status is: Inpatient  Remains inpatient appropriate because:Inpatient level of care appropriate due to severity of illness   Dispo: The patient is from: Home              Anticipated d/c is to: To be determined              Patient currently is not medically stable to d/c.   Difficult to place patient No   Barriers to Discharge: Advanced dementia with delirium-severe failure to thrive syndrome-goals of care discussion ongoing.  Antimicrobial agents: Anti-infectives (From admission, onward)   None       Time spent: 25- minutes-Greater than 50% of this time was spent in counseling, explanation of diagnosis, planning of further management, and coordination of  care.  MEDICATIONS: Scheduled Meds: . ascorbic acid  500 mg Oral Daily  . aspirin EC  81 mg Oral Daily  . atorvastatin  40 mg Oral Daily  . calcium carbonate  1,250 mg Oral Daily  . Chlorhexidine Gluconate Cloth  6 each Topical Q0600  . clopidogrel  75 mg Oral Daily  . [START ON 04/06/2020] darbepoetin (ARANESP) injection -  DIALYSIS  200 mcg Intravenous Q Thu-HD  . donepezil  5 mg Oral QHS  . doxercalciferol  3 mcg Intravenous Q T,Th,Sa-HD  . ezetimibe  10 mg Oral Daily  . fluticasone furoate-vilanterol  1 puff Inhalation Daily  . heparin  5,000 Units Subcutaneous Q12H  . insulin aspart  0-6 Units Subcutaneous TID WC  . LORazepam  0.5 mg Intravenous Once  . montelukast  10 mg Oral QHS  . OLANZapine zydis  5 mg Oral QHS  . pantoprazole  40 mg Oral Daily  . polyethylene glycol  17 g Oral BID  . potassium chloride  20 mEq Oral Once  . umeclidinium bromide  1 puff Inhalation Daily   Continuous Infusions: . ferric gluconate (FERRLECIT/NULECIT) IV 125 mg (04/04/20 1823)   PRN Meds:.acetaminophen, albuterol, azelastine, bisacodyl, guaiFENesin-dextromethorphan, Ipratropium-Albuterol, Muscle Rub, polyethylene glycol   PHYSICAL EXAM: Vital signs: Vitals:   04/05/20 0434 04/05/20 0438 04/05/20 0441 04/05/20 0654  BP: (!) 102/43 (!) 89/40 (!) 94/49 (!) 102/53  Pulse:      Resp:      Temp:      TempSrc:      SpO2:      Weight:      Height:       Filed Weights   04/03/20 0426 04/04/20 0730 04/04/20 1051  Weight: 103.2 kg 102.1 kg 100.1 kg   Body mass index is 37.88 kg/m.   Gen Exam: Confused-but not in any distress. HEENT:atraumatic, normocephalic Chest: B/L clear to auscultation anteriorly CVS:S1S2 regular Abdomen:soft non tender, non distended Extremities:no edema Neurology: Seems to be moving all 4 extremities. Skin: no rash  I have personally reviewed following labs and imaging studies  LABORATORY DATA: CBC: Recent Labs  Lab 03/31/20 0109 04/01/20 0809 04/02/20 0548 04/03/20 0129 04/04/20 0847 04/05/20 0201  WBC 6.6 5.5 5.2 5.1 5.1 6.4  NEUTROABS 2.7  --  1.6* 2.2  --  1.5*  HGB 7.8* 7.4* 7.5* 7.6* 7.8* 8.2*  HCT 26.5* 25.6* 25.3* 24.6* 25.9* 27.5*  MCV 80.8 80.8 79.3* 77.4* 78.0* 78.1*  PLT 143* 158 141* 124* 123* 115*    Basic Metabolic Panel: Recent Labs  Lab  03/31/20 0109 04/01/20 0809 04/02/20 0548 04/03/20 0129 04/04/20 0847 04/05/20 0201  NA 141 144 139 136 140 137  K 4.1 3.6 2.9* 2.9* 3.2* 3.3*  CL 106 106 102 102 101 101  CO2 24 27 26 26 28 25   GLUCOSE 112* 85 102* 148* 102* 125*  BUN 10 23 10 17  25* 12  CREATININE 4.43* 7.07* 4.71* 6.35* 8.31* 5.65*  CALCIUM 8.3* 8.1* 7.9* 7.9* 8.5* 8.4*  MG 2.0  --  1.9 2.1  --  2.1  PHOS 3.7 4.7* 3.4 4.2 5.6* 4.1    GFR: Estimated Creatinine Clearance: 10.1 mL/min (A) (by C-G formula based on SCr of 5.65 mg/dL (H)).  Liver Function Tests: Recent Labs  Lab 03/31/20 0109 04/01/20 0809 04/02/20 0548 04/03/20 0129 04/04/20 0847 04/05/20 0201  AST 38  --  29 25  --  17  ALT 8  --  11 11  --  8  ALKPHOS 60  --  53 55  --  55  BILITOT 0.4  --  1.1 0.6  --  0.9  PROT 6.6  --  6.7 6.8  --  7.0  ALBUMIN 2.6* 2.6* 2.7* 2.7* 2.6* 2.7*   No results for input(s): LIPASE, AMYLASE in the last 168 hours. Recent Labs  Lab 03/30/20 1145  AMMONIA 26    Coagulation Profile: No results for input(s): INR, PROTIME in the last 168 hours.  Cardiac Enzymes: No results for input(s): CKTOTAL, CKMB, CKMBINDEX, TROPONINI in the last 168 hours.  BNP (last 3 results) No results for input(s): PROBNP in the last 8760 hours.  Lipid Profile: No results for input(s): CHOL, HDL, LDLCALC, TRIG, CHOLHDL, LDLDIRECT in the last 72 hours.  Thyroid Function Tests: No results for input(s): TSH, T4TOTAL, FREET4, T3FREE, THYROIDAB in the last 72 hours.  Anemia Panel: No results for input(s): VITAMINB12, FOLATE, FERRITIN, TIBC, IRON, RETICCTPCT in the last 72 hours.  Urine analysis:    Component Value Date/Time   COLORURINE AMBER (A) 03/09/2020 0645   APPEARANCEUR CLOUDY (A) 03/09/2020 0645   LABSPEC 1.020 03/09/2020 0645   PHURINE 5.0 03/09/2020 0645   GLUCOSEU NEGATIVE 03/09/2020 0645   GLUCOSEU >=1000 01/02/2009 1101   HGBUR NEGATIVE 03/09/2020 0645   HGBUR negative 03/25/2008 1022   BILIRUBINUR  NEGATIVE 03/09/2020 0645   KETONESUR NEGATIVE 03/09/2020 0645   PROTEINUR 100 (A) 03/09/2020 0645   UROBILINOGEN 0.2 09/14/2013 1632   NITRITE NEGATIVE 03/09/2020 0645   LEUKOCYTESUR TRACE (A) 03/09/2020 0645    Sepsis Labs: Lactic Acid, Venous    Component Value Date/Time   LATICACIDVEN 1.2 02/15/2020 2235    MICROBIOLOGY: Recent Results (from the past 240 hour(s))  SARS CORONAVIRUS 2 (TAT 6-24 HRS) Nasopharyngeal Nasopharyngeal Swab     Status: None   Collection Time: 03/30/20  1:48 AM   Specimen: Nasopharyngeal Swab  Result Value Ref Range Status   SARS Coronavirus 2 NEGATIVE NEGATIVE Final    Comment: (NOTE) SARS-CoV-2 target nucleic acids are NOT DETECTED.  The SARS-CoV-2 RNA is generally detectable in upper and lower respiratory specimens during the acute phase of infection. Negative results do not preclude SARS-CoV-2 infection, do not rule out co-infections with other pathogens, and should not be used as the sole basis for treatment or other patient management decisions. Negative results must be combined with clinical observations, patient history, and epidemiological information. The expected result is Negative.  Fact Sheet for Patients: SugarRoll.be  Fact Sheet for Healthcare Providers: https://www.woods-mathews.com/  This test is not yet approved or cleared by the [place] FDA and  has been authorized for detection and/or diagnosis of SARS-CoV-2 by FDA under an Emergency Use Authorization (EUA). This EUA will remain  in effect (meaning this test can be used) for the duration of the COVID-19 declaration under Se ction 564(b)(1) of the Act, 21 U.S.C. section 360bbb-3(b)(1), unless the authorization is terminated or revoked sooner.  Performed at [facility], [place] [address], [place], [place] [address]     RADIOLOGY STUDIES/RESULTS: No results found.   LOS: 7 days   Oren Binet, MD  [facility]    To contact the attending provider between 7A-7P or the covering provider during after hours 7P-7A, please log into the web site [email or web address] and access using universal [place] password for that web site. If you do not have the password, please call the hospital operator.  04/05/2020, 2:14 PM

## 2020-04-05 NOTE — Progress Notes (Signed)
Palliative-   Received power of attorney paperwork from [place].   Paperwork received is for durable power of attorney but is NOT for healthcare power of attorney.   Therefore- patient's son- Joann Gutierrez is primary next of kin and surrogate decision maker.   [name], AGNP-C Palliative Medicine  No charge

## 2020-04-06 DIAGNOSIS — Z992 Dependence on renal dialysis: Secondary | ICD-10-CM

## 2020-04-06 DIAGNOSIS — E119 Type 2 diabetes mellitus without complications: Secondary | ICD-10-CM | POA: Diagnosis not present

## 2020-04-06 DIAGNOSIS — Z7189 Other specified counseling: Secondary | ICD-10-CM | POA: Diagnosis not present

## 2020-04-06 DIAGNOSIS — N186 End stage renal disease: Secondary | ICD-10-CM

## 2020-04-06 DIAGNOSIS — R4182 Altered mental status, unspecified: Secondary | ICD-10-CM | POA: Diagnosis not present

## 2020-04-06 DIAGNOSIS — I6389 Other cerebral infarction: Secondary | ICD-10-CM | POA: Diagnosis not present

## 2020-04-06 LAB — CBC
HCT: 27.3 % — ABNORMAL LOW (ref 36.0–46.0)
Hemoglobin: 8.4 g/dL — ABNORMAL LOW (ref 12.0–15.0)
MCH: 24.2 pg — ABNORMAL LOW (ref 26.0–34.0)
MCHC: 30.8 g/dL (ref 30.0–36.0)
MCV: 78.7 fL — ABNORMAL LOW (ref 80.0–100.0)
Platelets: 135 10*3/uL — ABNORMAL LOW (ref 150–400)
RBC: 3.47 MIL/uL — ABNORMAL LOW (ref 3.87–5.11)
RDW: 21 % — ABNORMAL HIGH (ref 11.5–15.5)
WBC: 4.9 10*3/uL (ref 4.0–10.5)
nRBC: 0 % (ref 0.0–0.2)

## 2020-04-06 LAB — GLUCOSE, CAPILLARY
Glucose-Capillary: 128 mg/dL — ABNORMAL HIGH (ref 70–99)
Glucose-Capillary: 126 mg/dL — ABNORMAL HIGH (ref 70–99)

## 2020-04-06 LAB — RENAL FUNCTION PANEL
Albumin: 2.8 g/dL — ABNORMAL LOW (ref 3.5–5.0)
Anion gap: 11 (ref 5–15)
BUN: 23 mg/dL (ref 8–23)
CO2: 26 mmol/L (ref 22–32)
Calcium: 8.2 mg/dL — ABNORMAL LOW (ref 8.9–10.3)
Chloride: 103 mmol/L (ref 98–111)
Creatinine, Ser: 8.38 mg/dL — ABNORMAL HIGH (ref 0.44–1.00)
GFR, Estimated: 5 mL/min — ABNORMAL LOW (ref >60)
Glucose, Bld: 158 mg/dL — ABNORMAL HIGH (ref 70–99)
Phosphorus: 5.6 mg/dL — ABNORMAL HIGH (ref 2.5–4.6)
Potassium: 3.5 mmol/L (ref 3.5–5.1)
Sodium: 140 mmol/L (ref 135–145)

## 2020-04-06 MED ORDER — HEPARIN SODIUM (PORCINE) 1000 UNIT/ML IJ SOLN
INTRAMUSCULAR | Status: AC
  Filled 2020-04-06: qty 4

## 2020-04-06 MED ORDER — DOXERCALCIFEROL 4 MCG/2ML IV SOLN
INTRAVENOUS | Status: AC
  Filled 2020-04-06: qty 2

## 2020-04-06 NOTE — Plan of Care (Signed)
  Problem: Education: Goal: Knowledge of General Education information will improve Description: Including pain rating scale, medication(s)/side effects and non-pharmacologic comfort measures Outcome: Progressing   Problem: Health Behavior/Discharge Planning: Goal: Ability to manage health-related needs will improve Outcome: Progressing   Problem: Clinical Measurements: Goal: Ability to maintain clinical measurements within normal limits will improve Outcome: Progressing Goal: Will remain free from infection Outcome: Progressing Goal: Diagnostic test results will improve Outcome: Progressing Goal: Respiratory complications will improve Outcome: Progressing Goal: Cardiovascular complication will be avoided Outcome: Progressing   Problem: Clinical Measurements: Goal: Will remain free from infection Outcome: Progressing   Problem: Activity: Goal: Risk for activity intolerance will decrease Outcome: Progressing   Problem: Nutrition: Goal: Adequate nutrition will be maintained Outcome: Progressing   Problem: Skin Integrity: Goal: Risk for impaired skin integrity will decrease Outcome: Progressing   Problem: Education: Goal: Knowledge of disease or condition will improve Outcome: Progressing Goal: Knowledge of secondary prevention will improve Outcome: Progressing Goal: Knowledge of patient specific risk factors addressed and post discharge goals established will improve Outcome: Progressing Goal: Individualized Educational Video(s) Outcome: Progressing   Problem: Coping: Goal: Will verbalize positive feelings about self Outcome: Progressing Goal: Will identify appropriate support needs Outcome: Progressing   Problem: Self-Care: Goal: Ability to participate in self-care as condition permits will improve Outcome: Progressing Goal: Verbalization of feelings and concerns over difficulty with self-care will improve Outcome: Progressing Goal: Ability to communicate  needs accurately will improve Outcome: Progressing

## 2020-04-06 NOTE — Progress Notes (Addendum)
[facility] Progress Note   Subjective: Seen in room, refused to be examined, threatening to hit me if I try. Has refused all medications. Refusing to work with PT, OT. HD today.    Objective Vitals:   04/05/20 1627 04/05/20 2000 04/05/20 2117 04/06/20 0440  BP: (!) 106/58 (!) 128/110 (!) 122/51 116/62  Pulse: 68  71 66  Resp: '20 17 16 18  ' Temp: 98 F (36.7 C)  98.2 F (36.8 C) 98 F (36.7 C)  TempSrc: Oral  Oral Oral  SpO2: 95% 95% 96% 97%  Weight:      Height:       Physical Exam Not performed-refused, threatening behavior.  Additional Objective Labs: Basic Metabolic Panel: Recent Labs  Lab 04/03/20 0129 04/04/20 0847 04/05/20 0201  NA 136 140 137  K 2.9* 3.2* 3.3*  CL 102 101 101  CO2 '26 28 25  ' GLUCOSE 148* 102* 125*  BUN 17 25* 12  CREATININE 6.35* 8.31* 5.65*  CALCIUM 7.9* 8.5* 8.4*  PHOS 4.2 5.6* 4.1   Liver Function Tests: Recent Labs  Lab 04/02/20 0548 04/03/20 0129 04/04/20 0847 04/05/20 0201  AST 29 25  --  17  ALT 11 11  --  8  ALKPHOS 53 55  --  55  BILITOT 1.1 0.6  --  0.9  PROT 6.7 6.8  --  7.0  ALBUMIN 2.7* 2.7* 2.6* 2.7*   No results for input(s): LIPASE, AMYLASE in the last 168 hours. CBC: Recent Labs  Lab 04/01/20 0809 04/02/20 0548 04/03/20 0129 04/04/20 0847 04/05/20 0201  WBC 5.5 5.2 5.1 5.1 6.4  NEUTROABS  --  1.6* 2.2  --  1.5*  HGB 7.4* 7.5* 7.6* 7.8* 8.2*  HCT 25.6* 25.3* 24.6* 25.9* 27.5*  MCV 80.8 79.3* 77.4* 78.0* 78.1*  PLT 158 141* 124* 123* 115*   Blood Culture    Component Value Date/Time   SDES IN/OUT CATH URINE 02/16/2020 1119   SPECREQUEST  02/16/2020 1119    NONE Performed at [facility] [address] [place], [place] [address]    CULT 20,000 COLONIES/mL ESCHERICHIA COLI (A) 02/16/2020 1119   REPTSTATUS 02/18/2020 FINAL 02/16/2020 1119    Cardiac Enzymes: No results for input(s): CKTOTAL, CKMB, CKMBINDEX, TROPONINI in the last 168 hours. CBG: Recent Labs  Lab  04/05/20 0744 04/05/20 1232 04/05/20 1625 04/05/20 2119 04/06/20 0810  GLUCAP 112* 139* 203* 143* 128*   Iron Studies: No results for input(s): IRON, TIBC, TRANSFERRIN, FERRITIN in the last 72 hours. '@lablastinr3' @ Studies/Results: No results found. Medications: . ferric gluconate (FERRLECIT/NULECIT) IV 125 mg (04/04/20 1823)   . ascorbic acid  500 mg Oral Daily  . aspirin EC  81 mg Oral Daily  . atorvastatin  40 mg Oral Daily  . calcium carbonate  1,250 mg Oral Daily  . Chlorhexidine Gluconate Cloth  6 each Topical Q0600  . clopidogrel  75 mg Oral Daily  . darbepoetin (ARANESP) injection - DIALYSIS  200 mcg Intravenous Q Thu-HD  . donepezil  5 mg Oral QHS  . doxercalciferol  3 mcg Intravenous Q T,Th,Sa-HD  . ezetimibe  10 mg Oral Daily  . fluticasone furoate-vilanterol  1 puff Inhalation Daily  . heparin  5,000 Units Subcutaneous Q12H  . insulin aspart  0-6 Units Subcutaneous TID WC  . LORazepam  0.5 mg Intravenous Once  . montelukast  10 mg Oral QHS  . OLANZapine zydis  5 mg Oral QHS  . pantoprazole  40 mg Oral Daily  .  polyethylene glycol  17 g Oral BID  . potassium chloride  20 mEq Oral Once  . umeclidinium bromide  1 puff Inhalation Daily     Dialysis Orders:  [facility] 3.5h 400/800 107kg 2/2 bath RIJ TDC Hep none - hect 3ug tiw - mircera 225 q2 , last 3/3 - venofer 50 mg per wk  Assessment/Plan: 1. AMS/Watershed distribution stroke - pt w/ underlying dementia. MRI brain with multifocal acute ischemia in watershed pattern. W/u per neuro/primary. Neurontin dc'd. Agitated again today. Refusing exam. Concerned that she will not be safe for OP HD D/T agitation.  2. ESRD - recent start to HD in Jan 2022. HD TTS. Next HD today. . 3. Hypokalemia. K+ 2.9 Have been supplementing K+. Unfortunately there are no 4.0 K bath available D/T national shortage. Put on 3.0 K bath. Labs are pending.  4. Recent COVID Pna admission 5. Dementia - advanced at  baseline 6. HTN - cont meds as needed. BP/volume ok. Below outpatient dry weight. 7. Chronic debility- pt is bedbound 8. OSA-per primary  9. Anemia ckd -HGB down to 7.8. Transfuse PRN. Ordered Aranesp 200 mcg IV for  03/1720.  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 Cont VDRA 11. Goal of care: Met with Palliative Care and was made a DNR. Now apparently family disputing this decision, asking that this order be rescinded. At this moment, she is a DNR. Per primary and palliative care. However, if she becomes combative on hemodialysis, attempts to disrupt dialysis blood lines, etc, she will no longer be a candidate for OP HD.   Zyrus Hetland H. Amoni Scallan NP-C 04/06/2020, 10:14 AM  [employer] [phone]

## 2020-04-06 NOTE — Progress Notes (Signed)
Attempted to give pt her morning meds. Pt refused to take meds. See [facility]

## 2020-04-06 NOTE — Plan of Care (Signed)
  Problem: Education: Goal: Knowledge of General Education information will improve Description: Including pain rating scale, medication(s)/side effects and non-pharmacologic comfort measures Outcome: Progressing   Problem: Health Behavior/Discharge Planning: Goal: Ability to manage health-related needs will improve Outcome: Progressing   Problem: Clinical Measurements: Goal: Ability to maintain clinical measurements within normal limits will improve Outcome: Progressing Goal: Will remain free from infection Outcome: Progressing Goal: Diagnostic test results will improve Outcome: Progressing Goal: Respiratory complications will improve Outcome: Progressing Goal: Cardiovascular complication will be avoided Outcome: Progressing   Problem: Activity: Goal: Risk for activity intolerance will decrease Outcome: Progressing   Problem: Nutrition: Goal: Adequate nutrition will be maintained Outcome: Progressing   Problem: Coping: Goal: Level of anxiety will decrease Outcome: Progressing   Problem: Coping: Goal: Level of anxiety will decrease Outcome: Progressing   Problem: Elimination: Goal: Will not experience complications related to bowel motility Outcome: Progressing Goal: Will not experience complications related to urinary retention Outcome: Progressing   Problem: Pain Managment: Goal: General experience of comfort will improve Outcome: Progressing   Problem: Safety: Goal: Ability to remain free from injury will improve Outcome: Progressing   Problem: Skin Integrity: Goal: Risk for impaired skin integrity will decrease Outcome: Progressing   Problem: Education: Goal: Knowledge of disease or condition will improve Outcome: Progressing Goal: Knowledge of secondary prevention will improve Outcome: Progressing Goal: Knowledge of patient specific risk factors addressed and post discharge goals established will improve Outcome: Progressing Goal: Individualized  Educational Video(s) Outcome: Progressing   Problem: Coping: Goal: Will verbalize positive feelings about self Outcome: Progressing Goal: Will identify appropriate support needs Outcome: Progressing   Problem: Health Behavior/Discharge Planning: Goal: Ability to manage health-related needs will improve Outcome: Progressing   Problem: Self-Care: Goal: Ability to participate in self-care as condition permits will improve Outcome: Progressing Goal: Verbalization of feelings and concerns over difficulty with self-care will improve Outcome: Progressing Goal: Ability to communicate needs accurately will improve Outcome: Progressing   Problem: Nutrition: Goal: Risk of aspiration will decrease Outcome: Progressing Goal: Dietary intake will improve Outcome: Progressing   Problem: Ischemic Stroke/TIA Tissue Perfusion: Goal: Complications of ischemic stroke/TIA will be minimized Outcome: Progressing

## 2020-04-06 NOTE — Progress Notes (Signed)
Physical Therapy Treatment Patient Details Name: Joann Gutierrez MRN: [id number] DOB: 06-09-1945 Today's Date: 04/06/2020    History of Present Illness 75 y.o. female presented via EMS with altered mentation with minimal response and no command follow. Admitted 03/29/20 for acute metabolic encephalopathy. CT with stable atrophy with periventricular small vessel disease, prior small infarct L cerebellum, no acute infarct. EEG moderate diffuse encephalopathy nonspecific etiology MRI revealed Multifocal acute ischemia in a deep watershed pattern, left worse than right. PMH: CKD stage V recently started HD from January 2022 after hospitalization for COVID , COPD, chronic hypoxic respite failure secondary to COPD, OSA on CPAP HS, dementia, ambulation dysfunction/bedbound, HTN, HLD,    PT Comments    Given pt inability to transfer to chair with RW in last session, plan for today to use Maximove to transfer to chair to sit up for breakfast. Pt able to come to longsitting in bed and PT/OT able to place lift pad under her. As lift pad being attached to lift, pt became agitated and started spitting and attempting to hit therapist. Pt grabbed a hold of PT arm and dug her fingernails in, requiring increased effort to remove. Pt asked whether she wanted therapy to come back and work with her in future to which she replied "leave me alone". Given pt limited participation and violent behavior PT will discharge from service. If pt becomes more cooperative, consider reordering therapy service. PT signing off.    Follow Up Recommendations  SNF     Equipment Recommendations  None recommended by PT    Recommendations for Other Services       Precautions / Restrictions Precautions Precautions: Fall Restrictions Weight Bearing Restrictions: No    Mobility  Bed Mobility Overal bed mobility: Needs Assistance             General bed mobility comments: Able to come to long sitting without assist and sat  this way for > 15 min while therapists attempted to place maximove pad under pt    Transfers                 General transfer comment: Attempted initiation of maximove lift to chair with placement of pad under pt. However, once attempting to hook pad to lift, pt became agitated, grabbed PT's arm and dug fingernails in skin. Removed lift pad and left pt in bed     Balance Overall balance assessment: Needs assistance Sitting-balance support: No upper extremity supported;Feet supported;Single extremity supported Sitting balance-[name]: Fair Sitting balance - Comments: long sitting without UE support and one UE                                    Cognition Arousal/Alertness: Awake/alert Behavior During Therapy: Agitated;Restless;Flat affect Overall Cognitive Status: History of cognitive impairments - at baseline                                 General Comments: Pt agitated when educated on plan for OOB attempts. Pt with varying answers and ultimately began ignoring therapists, not following directions by choice. Pt spitting during session, pushing therapists away and ultimately dug fingernails into PT's arm         General Comments General comments (skin integrity, edema, etc.): Despite, education/encouragement that family wants pt up in chair, pt adamantly resistant, swinging R UE at PT, spitting  on blankets, and yelling. When asked if pt wanted to be left alone and therapy stop attempting OOB tasks, pt reports yes, "leave me alone"      Pertinent Vitals/Pain Pain Assessment: Faces Faces Pain Scale: No hurt           PT Goals (current goals can now be found in the care plan section) Acute Rehab PT Goals Patient Stated Goal: to be left alone PT Goal Formulation: Patient unable to participate in goal setting Time For Goal Achievement: 04/14/20 Potential to Achieve Goals: Fair Progress towards PT goals: Not progressing toward goals - comment  (agitated)    Frequency    Min 2X/week      PT Plan Current plan remains appropriate    Co-evaluation PT/OT/SLP Co-Evaluation/Treatment: Yes Reason for Co-Treatment: Necessary to address cognition/behavior during functional activity PT goals addressed during session: Mobility/safety with mobility OT goals addressed during session: ADL's and self-care;Strengthening/ROM      AM-PAC PT "6 Clicks" Mobility   Outcome Measure  Help needed turning from your back to your side while in a flat bed without using bedrails?: A Little Help needed moving from lying on your back to sitting on the side of a flat bed without using bedrails?: A Lot Help needed moving to and from a bed to a chair (including a wheelchair)?: Total Help needed standing up from a chair using your arms (e.g., wheelchair or bedside chair)?: Total Help needed to walk in hospital room?: Total Help needed climbing 3-5 steps with a railing? : Total 6 Click Score: 9    End of Session   Activity Tolerance: Treatment limited secondary to agitation Patient left: in bed;with bed alarm set;with call bell/phone within reach Nurse Communication: Mobility status;Other (comment) (agitation with attempt to lift to chair) PT Visit Diagnosis: Unsteadiness on feet (R26.81);Other abnormalities of gait and mobility (R26.89);Muscle weakness (generalized) (M62.81);Difficulty in walking, not elsewhere classified (R26.2)     Time: [id number] PT Time Calculation (min) (ACUTE ONLY): 24 min  Charges:  $Therapeutic Activity: 8-22 mins                     Joann Gutierrez PT, DPT Acute Rehabilitation Services Pager [phone] Office [phone]    [place] 04/06/2020, 11:36 AM

## 2020-04-06 NOTE — Progress Notes (Signed)
Occupational Therapy Treatment/Discharge Patient Details Name: Joann Gutierrez MRN: [id number] DOB: [birth year] Today's Date: 04/06/2020    History of present illness 75 y.o. female presented via EMS with altered mentation with minimal response and no command follow. Admitted 03/29/20 for acute metabolic encephalopathy. CT with stable atrophy with periventricular small vessel disease, prior small infarct L cerebellum, no acute infarct. EEG moderate diffuse encephalopathy nonspecific etiology MRI revealed Multifocal acute ischemia in a deep watershed pattern, left worse than right. PMH: CKD stage V recently started HD from January 2022 after hospitalization for COVID , COPD, chronic hypoxic respite failure secondary to COPD, OSA on CPAP HS, dementia, ambulation dysfunction/bedbound, HTN, HLD,   OT comments  Attempted to engage pt in OOB activities with maximove lift to chair. Pt long sitting in bed and able to get lift pad under pt. However, once therapists began to hook pad to lift, pt began swinging arms, grabbed PT and dug fingernails in therapist's arm, and yelling. Pt spitting on blanket, on floor, etc throughout session. When asked if pt wanted therapists to stop seeing pt at hospital, pt reports yes and "leave me alone". Therapists removed lift pad and left pt sitting up in bed for breakfast, alarm active. Based on today's behaviors, reports of no longer wanting therapy, and minimal progress - will discharge OT services at acute level. Please reconsult if pt more agreeable to participate. Continue to recommend DC to SNF. However, if family opts to take pt home, will need 24/7 +2 assist and transfers via mechanical lift.    Follow Up Recommendations  SNF (will need 24/7 +2 physical assist if going home)    Equipment Recommendations  Wheelchair (measurements OT);Hospital bed;Wheelchair cushion (measurements OT);Other (comment) (hoyer lift)    Recommendations for Other Services       Precautions / Restrictions Precautions Precautions: Fall Restrictions Weight Bearing Restrictions: No       Mobility Bed Mobility Overal bed mobility: Needs Assistance             General bed mobility comments: Able to come to long sitting without assist and sat this way for > 15 min while therapists attempted to place maximove pad under pt    Transfers                 General transfer comment: Attempted initiation of maximove lift to chair with placement of pad under pt. However, once attempting to hook pad to lift, pt became agitated, grabbed PT's arm and dug fingernails in skin. Removed lift pad and left pt in bed    Balance Overall balance assessment: Needs assistance Sitting-balance support: No upper extremity supported;Feet supported;Single extremity supported Sitting balance-[name]: Fair Sitting balance - Comments: long sitting without UE support and one UE                                   ADL either performed or assessed with clinical judgement   ADL Overall ADL's : Needs assistance/impaired Eating/Feeding: Set up;Bed level Eating/Feeding Details (indicate cue type and reason): Setup for breakfast, assist to cut food. Pt would not answer whether she wanted OT to open butter or syrup to place on food, etc                   Lower Body Dressing Details (indicate cue type and reason): demo ability to reach and pull up socks long sitting in bed  General ADL Comments: Limited by agitation, capable of completing more than pt will demonstrate     Vision   Vision Assessment?: Vision impaired- to be further tested in functional context   Perception     Praxis      Cognition Arousal/Alertness: Awake/alert Behavior During Therapy: Agitated;Restless;Flat affect Overall Cognitive Status: History of cognitive impairments - at baseline                                 General Comments: Pt agitated  when educated on plan for OOB attempts. Pt with varying answers and ultimately began ignoring therapists, not following directions by choice. Pt spitting during session, pushing therapists away and ultimately dug fingernails into PT's arm        Exercises     Shoulder Instructions       General Comments Despite, education/encouragement that family wants pt up in chair, pt adamantly resistant, swinging R UE at PT, spitting on blankets, and yelling. When asked if pt wanted to be left alone and therapy stop attempting OOB tasks, pt reports yes, "leave me alone"    Pertinent Vitals/ Pain       Pain Assessment: Faces Faces Pain Scale: No hurt  Home Living                                          Prior Functioning/Environment              Frequency  Min 2X/week        Progress Toward Goals  OT Goals(current goals can now be found in the care plan section)  Progress towards OT goals: Not progressing toward goals - comment (agitated, self limiting)  Acute Rehab OT Goals Patient Stated Goal: to be left alone OT Goal Formulation: Patient unable to participate in goal setting Time For Goal Achievement: 04/16/20 Potential to Achieve Goals: Fair ADL Goals Pt Will Perform Upper Body Bathing: sitting;with set-up;with supervision Pt Will Perform Lower Body Bathing: with min assist;sit to/from stand;with supervision Pt Will Perform Upper Body Dressing: with set-up;sitting Pt Will Perform Lower Body Dressing: with mod assist;sit to/from stand Pt Will Transfer to Toilet: with min assist;bedside commode;stand pivot transfer Pt Will Perform Toileting - Clothing Manipulation and hygiene: with mod assist;sit to/from stand Additional ADL Goal #1: Will perform supine to sit with supervision in preparation for functional task.  Plan Discharge plan remains appropriate    Co-evaluation    PT/OT/SLP Co-Evaluation/Treatment: Yes Reason for Co-Treatment: Necessary to  address cognition/behavior during functional activity;For patient/therapist safety;To address functional/ADL transfers   OT goals addressed during session: ADL's and self-care;Strengthening/ROM      AM-PAC OT "6 Clicks" Daily Activity     Outcome Measure   Help from another person eating meals?: A Little Help from another person taking care of personal grooming?: A Little Help from another person toileting, which includes using toliet, bedpan, or urinal?: Total Help from another person bathing (including washing, rinsing, drying)?: A Lot Help from another person to put on and taking off regular upper body clothing?: A Lot Help from another person to put on and taking off regular lower body clothing?: Total 6 Click Score: 12    End of Session    OT Visit Diagnosis: Other abnormalities of gait and mobility (R26.89);Unsteadiness on feet (R26.81);Muscle weakness (generalized) (M62.81);Other symptoms  and signs involving cognitive function   Activity Tolerance Treatment limited secondary to agitation   Patient Left in bed;with call bell/phone within reach;with bed alarm set   Nurse Communication Mobility status;Other (comment) (agitation, RN present during some of session)        Time: [phone] OT Time Calculation (min): 26 min  Charges: OT General Charges $OT Visit: 1 Visit OT Treatments $Therapeutic Activity: 8-22 mins  Malachy Chamber, OTR/L Acute Rehab Services Office: [phone]   Layla Maw 04/06/2020, 11:00 AM

## 2020-04-06 NOTE — TOC Progression Note (Signed)
Transition of Care [facility]) - Progression Note    Patient Details  Name: HELLON VACCARELLA MRN: [id number] Date of Birth: [birth year]  Transition of Care [facility]) CM/SW [place], [place] Phone Number: 04/06/2020, 2:08 PM  Clinical Narrative:   CSW contacted by MD to ask about status of patient discharge to SNF. SNF is still awaiting insurance approval through [facility], anticipating hearing back tomorrow. CSW updated MD. Plan to admit to SNF tomorrow if stable. CSW to follow.    Expected Discharge Plan: [place] Barriers to Discharge: Continued Medical Work up,Insurance Authorization  Expected Discharge Plan and Services Expected Discharge Plan: [place] Choice: [place] arrangements for the past 2 months: [place]                                       Social Determinants of Health (SDOH) Interventions    Readmission Risk Interventions No flowsheet data found.

## 2020-04-06 NOTE — Progress Notes (Signed)
Daily Progress Note   Patient Name: Joann Gutierrez       Date: 04/06/2020 DOB: 12/03/1945  Age: 75 y.o. MRN#: [id number] Attending Physician: Jonetta Osgood, MD Primary Care Physician: Andree Moro, DO Admit Date: 03/29/2020  Reason for Consultation/Follow-up: Establishing goals of care  Subjective: Joann Gutierrez and I saw the patient at bedside.  Patient is comfortable and in no acute distress.  She has a tray of breakfast in front of her but patient is not interested in eating.  When attempt feeding, patient refused to eat.    Per RN notes, patient refused medication this morning. She was also agitated with PT/OT and refused physical therapy this morning.  Joann Gutierrez has spoken to Joann Gu, NP with nephrology.  Joann Gutierrez states that patient was not agitated and tolerated HD this morning.  Plan to continue with outpatient HD.  RN also reports that a sister of patient visited her yesterday and inquired about patient's DNR status.  The sister spoke to Joann Gutierrez would like to revert her DNR status.  We attempt to call [name] via phone but unsuccessful.  Review of Systems  Constitutional: Negative.   Respiratory: Negative for shortness of breath.   Cardiovascular: Negative.   Psychiatric/Behavioral: Negative.     Length of Stay: 8  Current Medications: Scheduled Meds:  . ascorbic acid  500 mg Oral Daily  . aspirin EC  81 mg Oral Daily  . atorvastatin  40 mg Oral Daily  . calcium carbonate  1,250 mg Oral Daily  . Chlorhexidine Gluconate Cloth  6 each Topical Q0600  . clopidogrel  75 mg Oral Daily  . darbepoetin (ARANESP) injection - DIALYSIS  200 mcg Intravenous Q Thu-HD  . donepezil  5 mg Oral QHS  . doxercalciferol  3 mcg Intravenous Q T,Th,Sa-HD  . ezetimibe  10 mg  Oral Daily  . fluticasone furoate-vilanterol  1 puff Inhalation Daily  . heparin  5,000 Units Subcutaneous Q12H  . insulin aspart  0-6 Units Subcutaneous TID WC  . LORazepam  0.5 mg Intravenous Once  . montelukast  10 mg Oral QHS  . OLANZapine zydis  5 mg Oral QHS  . pantoprazole  40 mg Oral Daily  . polyethylene glycol  17 g Oral BID  . potassium chloride  20 mEq Oral Once  .  umeclidinium bromide  1 puff Inhalation Daily    Continuous Infusions: . ferric gluconate (FERRLECIT/NULECIT) IV 125 mg (04/04/20 1823)    PRN Meds: acetaminophen, albuterol, azelastine, bisacodyl, guaiFENesin-dextromethorphan, Ipratropium-Albuterol, Muscle Rub, polyethylene glycol  Physical Exam Constitutional:      General: She is not in acute distress. HENT:     Head: Normocephalic.  Eyes:     General:        Right eye: No discharge.        Left eye: No discharge.  Pulmonary:     Effort: No respiratory distress.  Neurological:     Mental Status: She is alert.  Psychiatric:        Mood and Affect: Mood normal.             Vital Signs: BP 116/62 (BP Location: Left Wrist)   Pulse 66   Temp 98 F (36.7 C) (Oral)   Resp 18   Ht 5\' 4"  (1.626 m)   Wt 100.1 kg   SpO2 97%   BMI 37.88 kg/m  SpO2: SpO2: 97 % O2 Device: O2 Device: Room Air O2 Flow Rate:    Intake/output summary:   Intake/Output Summary (Last 24 hours) at 04/06/2020 1035 Last data filed at 04/05/2020 2000 Gross per 24 hour  Intake 370.33 ml  Output --  Net 370.33 ml   LBM: Last BM Date:  (PTA) Baseline Weight: Weight: 109 kg Most recent weight: Weight: 100.1 kg       Palliative Assessment/Data: 20%      Patient Active Problem List   Diagnosis Date Noted  . Acute bilat watershed infarction [facility]) 03/31/2020  . AMS (altered mental status) 03/29/2020  . Acute metabolic encephalopathy 40/98/1191  . ESRD on hemodialysis ([place]) 03/09/2020  . Thrombocytopenia ([place]) 03/09/2020  . Dementia with behavioral disturbance  ([place]) 03/09/2020  . COVID-19   . SOB (shortness of breath)   . Type 2 diabetes mellitus with hyperlipidemia ([place])   . Sepsis ([place]) 02/16/2020  . Acute on chronic respiratory failure with hypoxia ([place]) 02/16/2020  . Edema of left lower leg 02/16/2020  . Pneumonia due to COVID-19 virus 02/15/2020  . Acute on chronic diastolic HF (heart failure) ([place]) 11/18/2019  . Dyslipidemia 11/18/2019  . Polypharmacy 04/13/2019  . Healthcare maintenance 10/15/2017  . ARF (acute renal failure) ([place]) 09/07/2017  . Chest pain 09/05/2017  . Ineffective health maintenance 06/26/2017  . Medication management 06/26/2017  . Retinal edema 10/07/2016  . Postherpetic neuralgia 12/21/2015  . Herpes zoster with complication 47/82/9562  . Cellulitis of face 11/27/2015  . Anemia of chronic disease 08/17/2015  . History of colonic polyps   . Hypertensive emergency 05/20/2015  . Hypokalemia 05/20/2015  . AKI (acute kidney injury) ([place])   . Esophageal dysmotility 03/01/2015  . Throat congestion 10/11/2014  . Morbid obesity due to excess calories ([place]) 09/23/2014  . Acute asthmatic bronchitis 05/20/2014  . Abdominal pain, other specified site 09/13/2013  . Elevated liver enzymes 09/13/2013  . Cough 05/05/2013  . Pain in lower limb 03/05/2013  . DOE (dyspnea on exertion) 02/03/2013  . Chronic respiratory failure ([place]) 12/13/2012  . Stricture and stenosis of esophagus 08/31/2012  . Onychomycosis 06/01/2012  . Pain in joint, ankle and foot 06/01/2012  . Diabetes mellitus type 2 in obese ([place]) 04/29/2012  . Essential hypertension 07/19/2011  . CKD (chronic kidney disease), stage III ([place]) 05/19/2011  . Family history of malignant neoplasm of gastrointestinal tract 10/30/2010  . Bloating 10/16/2010  . Epigastric pain 10/16/2010  .  Foot pain 09/25/2010  . Dysphagia 07/16/2010  . GERD (gastroesophageal reflux disease) 07/16/2010  . Microcytic anemia 07/16/2010  . Obstructive sleep apnea 06/16/2009  .  HYPERCHOLESTEROLEMIA 02/16/2009  . 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 Gastroparesis 08/21/2007  . Chronic obstructive asthma ([place]) 08/09/2006    Palliative Care Assessment & Plan   Patient Profile: 75 y.o.femalewith past medical history of ESRD on HD, dementia, CAD,admitted on3/9/2022with watershed infarcts resulting in AMS, worsening dementia with behavior changes.Palliative medicine consulted for [place].  Assessment/Recommendations/Plan  Patient tolerated HD well this morning.  Will plan to continue outpatient HD per nephrology.  Please continue prescription of Zyprexa after discharge. Per attending team- family will take patient home with outpatient palliative Will follow-up with [name] on patient's CODE STATUS if he calls back  Goals of Care and Additional Recommendations: Limitations on Scope of Treatment: Avoid Hospitalization  Code Status: DNR  Prognosis:  Unable to determine  Discharge Planning: Home with outpatient palliative   Care plan was discussed with Dr. Sloan Leiter   Thank you for allowing the Palliative Medicine Team to assist in the care of this patient.   Time In:  0927 Time Out:  1008 Total Time 41 mins Prolonged Time Billed  no       Greater than 50%  of this time was spent counseling and coordinating care related to the above assessment and plan.  [name], AGNP-C Palliative Medicine  Gaylan Gerold, DO Internal Medicine    Please contact Palliative Medicine Team phone at [phone] for questions and concerns.

## 2020-04-06 NOTE — Progress Notes (Addendum)
PROGRESS NOTE        PATIENT DETAILS Name: Joann Gutierrez Age: 75 y.o. Sex: female Date of Birth: 08/02/1945 Admit Date: 03/29/2020 Admitting Physician Lequita Halt, MD HAL:[id number], Lucila Maine, DO  Brief Narrative: Patient is a 75 y.o. female ESRD, OSA on CPAP, COPD, dementia, chronic ambulatory dysfunction/bedbound status-with ongoing failure to thrive syndrome since January 2022- presented with altered mental status-she was found to have CVA.  See below for further details  Significant events: 3/9>> admit for altered mental status  Significant studies: 3/11>> MRI brain: Multifocal infarcts-left>> right 3/11>> Echo: EF 24-09%, grade 1 diastolic dysfunction.  RVSP 46.9. 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  Per nursing staff-if family around-we will eat and be cooperative.  Without family-she is not very cooperative with diet or with activity.  When I saw her earlier this morning-she was calm/quiet-and following simple commands.  Assessment/Plan: Acute CVA: Remains confused-able to move all 4 extremities-evaluated by neurology with recommendations to continue aspirin/Plavix for 3 weeks-followed by Plavix alone.  ESRD: On HD-nephrology following-tolerating dialysis reasonably well with Zyprexa.  Anemia: Due to ESRD-no evidence of blood loss-follow hemoglobin.  Watch closely.  Advanced dementia with delirium: Continue Aricept-on Zyprexa.  Expect patient to be confused as long as she remains  hospitalized.  Frailty/failure to thrive syndrome/chronic ambulatory dysfunction/bedbound status: Per prior documentation-significant deterioration in her functional status since January 2022.  Palliative care-prior MD had a family meeting on 3/15-now DNR.  Her overall prognosis is poor as she continues to have failure to thrive syndrome.  On 3/17-I was notified by RN that the family wanted to revoke DNR-I have spoken with the patient's son x2 today-he indicated to me that when he spoke with the patient yesterday that she wanted the DNR revoked.  All understand that the patient's long-term prognosis is poor.  Will benefit from continued palliative care discussion at SNF.  DM-2: CBG stable-continue SSI  Recent Labs    04/05/20 1625 04/05/20 2119 04/06/20 0810  GLUCAP 203* 143* 128*   OSA: CPAP nightly  Obesity: Estimated body mass index is 37.01 kg/m as calculated from the following:   Height as of this encounter: 5\' 4"  (1.626 m).   Weight as of this encounter: 97.8 kg.    Diet: Diet Order            Diet renal with fluid restriction Fluid restriction: 1200 mL Fluid; Room service appropriate? No; Fluid consistency: Thin  Diet effective now                  Code Status: DNR-at family's request-we will change back to full code.  Family Communication: Spoke with son-[name]-[phone]-over phone on 3/17.  Initially contemplated taking her home with hospice if she was not able to tolerate dialysis.  However since she is tolerating dialysis well without any agitation-when plans for discharge was discussed-he claimed that the prior MD told him that she could stay for couple of weeks and wanted Dr. Florene Glen to reassess.  I told him that was not possible-and after much discussion-he has agreed for patient to go back to SNF.  Reached out to social worker-probably SNF tomorrow.  Disposition Plan: Status is: Inpatient  Remains inpatient appropriate because:Inpatient level of care  appropriate due to severity of illness   Dispo: The patient is from: Home              Anticipated d/c is to: To be determined              Patient currently is not medically stable to d/c.   Difficult to place patient No   Barriers to Discharge: Advanced dementia with delirium-severe failure to thrive syndrome-goals of care discussion ongoing.  Plan is to discharge back to SNF on 3/18.  Antimicrobial agents: Anti-infectives (From admission, onward)   None       Time spent: 25- minutes-Greater than 50% of this time was spent in counseling, explanation of diagnosis, planning of further management, and coordination of care.  MEDICATIONS: Scheduled Meds: . ascorbic acid  500 mg Oral Daily  . aspirin EC  81 mg Oral Daily  . atorvastatin  40 mg Oral Daily  . calcium carbonate  1,250 mg Oral Daily  . Chlorhexidine Gluconate Cloth  6 each Topical Q0600  . clopidogrel  75 mg Oral Daily  . darbepoetin (ARANESP) injection - DIALYSIS  200 mcg Intravenous Q Thu-HD  . donepezil  5 mg Oral QHS  . doxercalciferol  3 mcg Intravenous Q T,Th,Sa-HD  . ezetimibe  10 mg Oral Daily  . fluticasone furoate-vilanterol  1 puff Inhalation Daily  . heparin  5,000 Units Subcutaneous Q12H  . insulin aspart  0-6 Units Subcutaneous TID WC  . LORazepam  0.5 mg Intravenous Once  . montelukast  10 mg Oral QHS  . OLANZapine zydis  5 mg Oral QHS  . pantoprazole  40 mg Oral Daily  . polyethylene glycol  17 g Oral BID  . potassium chloride  20 mEq Oral Once  . umeclidinium bromide  1 puff Inhalation Daily   Continuous Infusions: . ferric gluconate (FERRLECIT/NULECIT) IV 125 mg (04/04/20 1823)   PRN Meds:.acetaminophen, albuterol, azelastine, bisacodyl, guaiFENesin-dextromethorphan, Ipratropium-Albuterol, Muscle Rub, polyethylene glycol   PHYSICAL EXAM: Vital signs: Vitals:   04/06/20 0440 04/06/20 1125 04/06/20 1131 04/06/20 1200  BP: 116/62 (!) 117/54 132/62 (!) 121/58  Pulse: 66 66  69  Resp: 18  16    Temp: 98 F (36.7 C) 97.7 F (36.5 C)    TempSrc: Oral Oral    SpO2: 97% 96%    Weight:  97.8 kg    Height:       Filed Weights   04/04/20 0730 04/04/20 1051 04/06/20 1125  Weight: 102.1 kg 100.1 kg 97.8 kg   Body mass index is 37.01 kg/m.   Gen Exam: Pleasantly confused.  Not any distress. HEENT:atraumatic, normocephalic Chest: B/L clear to auscultation anteriorly CVS:S1S2 regular Abdomen:soft non tender, non distended Extremities:no edema Neurology: Non focal Skin: no rash  I have personally reviewed following labs and imaging studies  LABORATORY DATA: CBC: Recent Labs  Lab 03/31/20 0109 04/01/20 0809 04/02/20 0548 04/03/20 0129 04/04/20 0847 04/05/20 0201  WBC 6.6 5.5 5.2 5.1 5.1 6.4  NEUTROABS 2.7  --  1.6* 2.2  --  1.5*  HGB 7.8* 7.4* 7.5* 7.6* 7.8* 8.2*  HCT 26.5* 25.6* 25.3* 24.6* 25.9* 27.5*  MCV 80.8 80.8 79.3* 77.4* 78.0* 78.1*  PLT 143* 158 141* 124* 123* 115*    Basic  Metabolic Panel: Recent Labs  Lab 03/31/20 0109 04/01/20 0809 04/02/20 0548 04/03/20 0129 04/04/20 0847 04/05/20 0201  NA 141 144 139 136 140 137  K 4.1 3.6 2.9* 2.9* 3.2* 3.3*  CL 106 106 102 102 101 101  CO2 24 27 26 26 28 25   GLUCOSE 112* 85 102* 148* 102* 125*  BUN 10 23 10 17  25* 12  CREATININE 4.43* 7.07* 4.71* 6.35* 8.31* 5.65*  CALCIUM 8.3* 8.1* 7.9* 7.9* 8.5* 8.4*  MG 2.0  --  1.9 2.1  --  2.1  PHOS 3.7 4.7* 3.4 4.2 5.6* 4.1    GFR: Estimated Creatinine Clearance: 9.9 mL/min (A) (by C-G formula based on SCr of 5.65 mg/dL (H)).  Liver Function Tests: Recent Labs  Lab 03/31/20 0109 04/01/20 0809 04/02/20 0548 04/03/20 0129 04/04/20 0847 04/05/20 0201  AST 38  --  29 25  --  17  ALT 8  --  11 11  --  8  ALKPHOS 60  --  53 55  --  55  BILITOT 0.4  --  1.1 0.6  --  0.9  PROT 6.6  --  6.7 6.8  --  7.0  ALBUMIN 2.6* 2.6* 2.7* 2.7* 2.6* 2.7*   No results for input(s): LIPASE, AMYLASE in the last 168 hours. Recent Labs  Lab 03/30/20 1145  AMMONIA  26    Coagulation Profile: No results for input(s): INR, PROTIME in the last 168 hours.  Cardiac Enzymes: No results for input(s): CKTOTAL, CKMB, CKMBINDEX, TROPONINI in the last 168 hours.  BNP (last 3 results) No results for input(s): PROBNP in the last 8760 hours.  Lipid Profile: No results for input(s): CHOL, HDL, LDLCALC, TRIG, CHOLHDL, LDLDIRECT in the last 72 hours.  Thyroid Function Tests: No results for input(s): TSH, T4TOTAL, FREET4, T3FREE, THYROIDAB in the last 72 hours.  Anemia Panel: No results for input(s): VITAMINB12, FOLATE, FERRITIN, TIBC, IRON, RETICCTPCT in the last 72 hours.  Urine analysis:    Component Value Date/Time   COLORURINE AMBER (A) 03/09/2020 0645   APPEARANCEUR CLOUDY (A) 03/09/2020 0645   LABSPEC 1.020 03/09/2020 0645   PHURINE 5.0 03/09/2020 0645   GLUCOSEU NEGATIVE 03/09/2020 0645   GLUCOSEU >=1000 01/02/2009 1101   HGBUR NEGATIVE 03/09/2020 0645   HGBUR negative 03/25/2008 1022   BILIRUBINUR NEGATIVE 03/09/2020 0645   KETONESUR NEGATIVE 03/09/2020 0645   PROTEINUR 100 (A) 03/09/2020 0645   UROBILINOGEN 0.2 09/14/2013 1632   NITRITE NEGATIVE 03/09/2020 0645   LEUKOCYTESUR TRACE (A) 03/09/2020 0645    Sepsis Labs: Lactic Acid, Venous    Component Value Date/Time   LATICACIDVEN 1.2 02/15/2020 2235    MICROBIOLOGY: Recent Results (from the past 240 hour(s))  SARS CORONAVIRUS 2 (TAT 6-24 HRS) Nasopharyngeal Nasopharyngeal Swab     Status: None   Collection Time: 03/30/20  1:48 AM   Specimen: Nasopharyngeal Swab  Result Value Ref Range Status   SARS Coronavirus 2 NEGATIVE NEGATIVE Final    Comment: (NOTE) SARS-CoV-2 target nucleic acids are NOT DETECTED.  The SARS-CoV-2 RNA is generally detectable in upper and lower respiratory specimens during the acute phase of infection. Negative results do not preclude SARS-CoV-2 infection, do not rule out co-infections with other pathogens, and should not be used as the sole basis for  treatment or other patient management decisions. Negative results must be combined with clinical observations, patient history, and epidemiological information. The expected result is Negative.  Fact Sheet for Patients: SugarRoll.be  Fact Sheet for Healthcare Providers: https://www.woods-mathews.com/  This test is not yet approved or cleared by the [place] and  has been authorized for detection and/or diagnosis of SARS-CoV-2 by FDA under an Emergency Use Authorization (EUA). This EUA will remain  in effect (meaning this test can be used) for the duration of the COVID-19 declaration under Se ction 564(b)(1) of the Act, 21 U.S.C. section 360bbb-3(b)(1), unless the authorization is terminated or revoked sooner.  Performed at [facility], [place] [address], [place], [place] [address]     RADIOLOGY STUDIES/RESULTS: No results found.   LOS: 8 days   Oren Binet, MD  [facility]    To contact the attending provider between 7A-7P or the covering provider during after hours 7P-7A, please log into the web site [email or web address] and access using universal [place] password for that web site. If you do not have the password, please call the hospital operator.  04/06/2020, 12:11 PM

## 2020-04-07 DIAGNOSIS — I10 Essential (primary) hypertension: Secondary | ICD-10-CM | POA: Diagnosis not present

## 2020-04-07 DIAGNOSIS — N186 End stage renal disease: Secondary | ICD-10-CM | POA: Diagnosis not present

## 2020-04-07 LAB — GLUCOSE, CAPILLARY
Glucose-Capillary: 164 mg/dL — ABNORMAL HIGH (ref 70–99)
Glucose-Capillary: 144 mg/dL — ABNORMAL HIGH (ref 70–99)
Glucose-Capillary: 132 mg/dL — ABNORMAL HIGH (ref 70–99)
Glucose-Capillary: 118 mg/dL — ABNORMAL HIGH (ref 70–99)

## 2020-04-07 LAB — SARS CORONAVIRUS 2 (TAT 6-24 HRS): SARS Coronavirus 2: NEGATIVE

## 2020-04-07 MED ORDER — OLANZAPINE 5 MG PO TBDP: 5.0000 mg | ORAL_TABLET | Freq: Every day | ORAL | Status: DC

## 2020-04-07 MED ORDER — CHLORHEXIDINE GLUCONATE CLOTH 2 % EX PADS
6.0000 | MEDICATED_PAD | Freq: Every day | CUTANEOUS | Status: DC
  Administered 2020-04-08: 6 via TOPICAL

## 2020-04-07 MED ORDER — INSULIN ASPART 100 UNIT/ML ~~LOC~~ SOLN: SUBCUTANEOUS | 11 refills | Status: DC

## 2020-04-07 MED ORDER — DARBEPOETIN ALFA 200 MCG/0.4ML IJ SOSY
200.0000 ug | PREFILLED_SYRINGE | INTRAMUSCULAR | Status: DC
  Administered 2020-04-08: 200 ug via INTRAVENOUS
  Filled 2020-04-07: qty 0.4

## 2020-04-07 NOTE — Discharge Summary (Addendum)
PATIENT DETAILS Name: NICKIE WARWICK Age: 75 y.o. Sex: female Date of Birth: 1945/11/22 MRN: [id number]. Admitting Physician: Lequita Halt, MD MAU:[id number], Lucila Maine, DO  Admit Date: 03/29/2020 Discharge date: 04/08/2020  Recommendations for Outpatient Follow-up:  1. Follow up with PCP in 1-2 weeks 2. Please obtain CMP/CBC in one week 3. Please ensure follow-up at neurology/stroke clinic. 4. Please continue outpatient hemodialysis as per her previous schedule. 5. Please consider palliative care follow  Admitted From:  SNF  Disposition: Initial disposition was for SNF-however insurance denied-patient being discharged home with maximum home health services.   Home Health: yes  Equipment/Devices: None  Discharge Condition: Stable  CODE STATUS: FULL CODE  Diet recommendation:  Diet Order            Diet - low sodium heart healthy           Diet renal with fluid restriction Fluid restriction: 1200 mL Fluid; Room service appropriate? No; Fluid consistency: Thin  Diet effective now                  Brief Narrative: Patient is a 75 y.o. female ESRD, OSA on CPAP, COPD, dementia, chronic ambulatory dysfunction/bedbound status-with ongoing failure to thrive syndrome since January 2022- presented with altered mental status-she was found to have CVA.  See below for further details  Significant events: 3/9>> admit for altered mental status  Significant studies: 3/11>> MRI brain: Multifocal infarcts-left>> right 3/11>> Echo: EF 45-62%, grade 1 diastolic dysfunction.  RVSP 46.9. 3/11>> carotid Doppler: No major stenosis 3/13>> MRI brain: NO large vessel occlusion-moderate to severe left PCA P2-distal MCA M1 stenosis 3/13>> A1c 6.1 3/13>> LDL 123  Antimicrobial therapy: None  Microbiology data: None  Procedures : None  Consults: Neurology, palliative care, nephrology  Brief Hospital Course: Acute CVA: Nonfocal exam-mildly but pleasantly  confused-following commands-moving all 4 extremities.  Work-up as above.  Evaluated by neurology with recommendations to  continue aspirin/Plavix for 3 weeks-followed by Plavix alone.  ESRD: On HD-nephrology followed closely-tolerating dialysis reasonably well with Zyprexa without any behavioral issues.  HTN: BP stable-all antihypertensives have been discontinued.  Continue close monitoring at SNF.  Anemia: Due to ESRD-no evidence of blood loss-follow hemoglobin.  Watch closely.  Repeat CBC in 1 week.  Advanced dementia with delirium:  Pleasantly confused-no agitation or behavioral issues-tolerated dialysis well yesterday after being started on Zyprexa.  Plans are to continue Zyprexa on discharge-remains on Aricept.    Frailty/failure to thrive syndrome/chronic ambulatory dysfunction/bedbound status: Per prior documentation-significant deterioration in her functional status since January 2022.  Palliative care-prior MD had a family meeting on 3/15-was made a DNR.  Family aware that her overall prognosis is poor.  Upon further discussion by family-although they understand that patient's prognosis is poor-and now since patient has tolerated Zyprexa well-and has had no major behavioral issues-they want to continue with aggressive care.  Family revoked DNR on 3/17.  Per nursing staff-patient eats reasonably well if family members are around-her delirium is much better after starting Zyprexa.  Suggest continued involvement of palliative care in the outpatient setting.  Given overall poor prognosis-she remains at risk for deterioration and repeated hospitalization.  However-she is stable-to be discharged back to SNF where further palliative care discussion can be ongoing.  DM-2: CBG stable-with just SSI-no longer on Lantus.  Allow some amount of permissive hyperglycemia-avoid tight glycemic control.  Will stop insulin on discharge-continue to follow closely in the outpatient by PCP.  OSA: CPAP  nightly  Obesity: Estimated  body mass index is 37.01 kg/m as calculated from the following:   Height as of this encounter: 5\' 4"  (1.626 m).   Weight as of this encounter: 97.8 kg.    Discharge Diagnoses:  Active Problems:   AMS (altered mental status)   Acute bilat watershed infarction [facility])   Discharge Instructions:  Activity:  As tolerated with Full fall precautions use walker/cane & assistance as needed   Discharge Instructions    Ambulatory referral to Neurology   Complete by: As directed    An appointment is requested in approximately: 4 weeks   Diet - low sodium heart healthy   Complete by: As directed    Discharge instructions   Complete by: As directed    Check CBGs-before meals and at bedtime   Follow with Primary MD  Andree Moro, DO in 1-2 weeks   Please get a complete blood count and chemistry panel checked by your Primary MD at your next visit, and again as instructed by your Primary MD.  Get Medicines reviewed and adjusted: Please take all your medications with you for your next visit with your Primary MD  Laboratory/radiological data: Please request your Primary MD to go over all hospital tests and procedure/radiological results at the follow up, please ask your Primary MD to get all Hospital records sent to his/her office.  In some cases, they will be blood work, cultures and biopsy results pending at the time of your discharge. Please request that your primary care M.D. follows up on these results.  Also Note the following: If you experience worsening of your admission symptoms, develop shortness of breath, life threatening emergency, suicidal or homicidal thoughts you must seek medical attention immediately by calling 911 or calling your MD immediately  if symptoms less severe.  You must read complete instructions/literature along with all the possible adverse reactions/side effects for all the Medicines you take and that have been prescribed to you.  Take any new Medicines after you have completely understood and accpet all the possible adverse reactions/side effects.   Do not drive when taking Pain medications or sleeping medications (Benzodaizepines)  Do not take more than prescribed Pain, Sleep and Anxiety Medications. It is not advisable to combine anxiety,sleep and pain medications without talking with your primary care practitioner  Special Instructions: If you have smoked or chewed Tobacco  in the last 2 yrs please stop smoking, stop any regular Alcohol  and or any Recreational drug use.  Wear Seat belts while driving.  Please note: You were cared for by a hospitalist during your hospital stay. Once you are discharged, your primary care physician will handle any further medical issues. Please note that NO REFILLS for any discharge medications will be authorized once you are discharged, as it is imperative that you return to your primary care physician (or establish a relationship with a primary care physician if you do not have one) for your post hospital discharge needs so that they can reassess your need for medications and monitor your lab values.   Increase activity slowly   Complete by: As directed      Allergies as of 04/08/2020      Reactions   Morphine And Related Other (See Comments)   Family request not to be given, reports pt does not wake up when given    Promethazine Hcl Other (See Comments)   REACTION: lethargy      Medication List    STOP taking these medications   amLODipine 5 MG  tablet Commonly known as: NORVASC   carvedilol 25 MG tablet Commonly known as: COREG   gabapentin 100 MG capsule Commonly known as: NEURONTIN   hydrALAZINE 100 MG tablet Commonly known as: APRESOLINE   insulin aspart 100 UNIT/ML injection Commonly known as: novoLOG   insulin glargine 100 UNIT/ML Solostar Pen Commonly known as: LANTUS   TRUEplus Pen Needles 32G X 4 MM Misc Generic drug: Insulin Pen Needle   zinc  sulfate 220 (50 Zn) MG capsule     TAKE these medications   acetaminophen 325 MG tablet Commonly known as: TYLENOL Take 650 mg by mouth every 4 (four) hours as needed for moderate pain.   albuterol 108 (90 Base) MCG/ACT inhaler Commonly known as: VENTOLIN HFA Inhale 2 puffs into the lungs every 4 (four) hours as needed for wheezing or shortness of breath.   ascorbic acid 500 MG tablet Commonly known as: VITAMIN C Take 1 tablet (500 mg total) by mouth daily.   aspirin EC 81 MG tablet Take 1 tablet (81 mg total) by mouth daily. For 2 weeks-along with Plavix-after 2 weeks-stop aspirin. What changed: additional instructions   azelastine 0.1 % nasal spray Commonly known as: ASTELIN Place 1 spray into both nostrils 2 (two) times daily as needed (allergies). Use in each nostril as directed   bisacodyl 5 MG EC tablet Commonly known as: DULCOLAX Take 1 tablet (5 mg total) by mouth daily as needed for moderate constipation.   calcium carbonate 1500 (600 Ca) MG Tabs tablet Commonly known as: OSCAL Take 1 tablet (1,500 mg total) by mouth daily.   clopidogrel 75 MG tablet Commonly known as: PLAVIX Take 1 tablet (75 mg total) by mouth daily.   donepezil 5 MG tablet Commonly known as: ARICEPT Take 1 tablet (5 mg total) by mouth at bedtime.   ezetimibe 10 MG tablet Commonly known as: ZETIA Take 1 tablet (10 mg total) by mouth daily.   ferrous sulfate 325 (65 FE) MG tablet Take 1 tablet (325 mg total) by mouth daily.   guaiFENesin-dextromethorphan 100-10 MG/5ML syrup Commonly known as: ROBITUSSIN DM Take 10 mLs by mouth every 4 (four) hours as needed for cough.   Ipratropium-Albuterol 20-100 MCG/ACT Aers respimat Commonly known as: COMBIVENT Inhale 1 puff into the lungs every 6 (six) hours as needed for wheezing or shortness of breath.   lip balm ointment Apply topically as needed for lip care.   montelukast 10 MG tablet Commonly known as: SINGULAIR Take 1 tablet (10 mg  total) by mouth daily.   Nepro Liqd Take 237 mLs by mouth 2 (two) times daily. For caloric/protein   nitroGLYCERIN 0.4 MG SL tablet Commonly known as: NITROSTAT Place 1 tablet (0.4 mg total) under the tongue every 5 (five) minutes as needed for chest pain. What changed: See the new instructions.   OLANZapine zydis 5 MG disintegrating tablet Commonly known as: ZYPREXA Take 1 tablet (5 mg total) by mouth at bedtime.   omeprazole 40 MG capsule Commonly known as: PRILOSEC Take 1 capsule (40 mg total) by mouth in the morning and at bedtime. Please schedule a yearly follow up: [phone]. Thank you   OXYGEN Inhale 2 L into the lungs as needed. CPAP with oxygen at bedtime   polyethylene glycol 17 g packet Commonly known as: MiraLax Take 17 g by mouth daily as needed (constipation.).   Systane Balance 0.6 % Soln Generic drug: Propylene Glycol Place 1 drop into both eyes daily as needed (dry eyes).   Trelegy Ellipta  100-62.5-25 MCG/INH Aepb Generic drug: Fluticasone-Umeclidin-Vilant Take 1 puff by mouth daily.   Vitamin D3 25 MCG tablet Commonly known as: Vitamin D Take 1 tablet (1,000 Units total) by mouth daily.            Durable Medical Equipment  (From admission, onward)         Start     Ordered   04/07/20 1623  For home use only DME Hospital bed  Once       Question Answer Comment  Length of Need Lifetime   Patient has (list medical condition): Hx HD, cva, dementia, codp, FTT, ambulation dysfunction/bedbound   The above medical condition requires: Patient requires the ability to reposition frequently   Head must be elevated greater than: 30 degrees   Bed type Semi-electric   Hoyer Lift Yes   Support Surface: Gel Overlay      04/07/20 1625   04/07/20 1622  For home use only DME lightweight manual wheelchair with seat cushion  Once       Comments: Patient suffers from ambulation dysfunction/bedbound which impairs their ability to perform daily activities like  walking in the home.  A rolling walker will not resolve  issue with performing activities of daily living. A wheelchair will allow patient to safely perform daily activities. Patient is not able to propel themselves in the home using a standard weight wheelchair due to weakness. Patient can self propel in the lightweight wheelchair. Length of need : life time. Accessories: elevating leg rests (ELRs), wheel locks, extensions and anti-tippers.   04/07/20 1625          Contact information for follow-up providers    Andree Moro, DO. Schedule an appointment as soon as possible for a visit in 1 week(s).   Specialty: General Practice Contact information: [place] [address] [phone]        Minus Breeding, MD Follow up in 1 month(s).   Specialty: Cardiology Contact information: [address] [place] [address] [phone]        [facility] Follow up.   Why: Office will call-if you do not hear from them-please give them a call in the next week or so. Contact information: [address]     [place]  [address] [phone]       [facility] Follow up.   Why: [place] arranged for PT/OT/RN/aide/SW. They will contact you to arrange first visit.  Contact information: [phone]           Contact information for after-discharge care    Destination    [facility] Preferred SNF .   Service: Skilled Nursing Contact information: [address] [address] [phone]                 Allergies  Allergen Reactions  . Morphine And Related Other (See Comments)    Family request not to be given, reports pt does not wake up when given   . Promethazine Hcl Other (See Comments)    REACTION: lethargy     Other Procedures/Studies: CT HEAD WO CONTRAST  Result Date: 03/30/2020 CLINICAL DATA:  Delirium/altered mental status EXAM: CT  HEAD WITHOUT CONTRAST TECHNIQUE: Contiguous axial images were obtained from the base of the skull through the vertex without intravenous contrast. COMPARISON:  March 29, 2020; March 09, 2020. FINDINGS: Brain: Mild to moderate diffuse atrophy is stable. There is no intracranial mass, hemorrhage, extra-axial fluid collection,  or midline shift. There is patchy decreased attenuation throughout the centra semiovale bilaterally, stable. There is evidence of a prior small infarct in the mid left cerebellum laterally, stable. No acute infarct is evident. Vascular: No appreciable hyperdense vessel. There is calcification in each carotid siphon region. Skull: Bony calvarium appears intact. Sinuses/Orbits: There is an air-fluid level in the left sphenoid sinus. There is mucosal thickening in several ethmoid air cells. Mild mucosal thickening is noted in the inferior right maxillary antrum. Patient is status post cataract removal on the left. Orbits otherwise appear symmetric. Other: Mastoid air cells clear. 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 No acute infarct. No mass or hemorrhage. There are foci of arterial vascular calcification. There are foci of paranasal sinus disease. Note air-fluid level in the left sphenoid sinus. Electronically Signed   By: Lowella Grip III M.D.   On: 03/30/2020 10:28   CT HEAD WO CONTRAST  Result Date: 03/29/2020 CLINICAL DATA:  Altered mental status, delirium EXAM: CT HEAD WITHOUT CONTRAST TECHNIQUE: Contiguous axial images were obtained from the base of the skull through the vertex without intravenous contrast. COMPARISON:  03/09/2020 FINDINGS: Technical note: Examination is severely limited by patient motion artifact. Patient was uncooperative/combative. Study was repeated multiple times. Best possible images were submitted. Brain: No evidence of acute large territory infarct, intracranial hemorrhage, or extra-axial  collection on limited view. Vascular: Limited. Skull: No evidence of depressed calvarial fracture. Sinuses/Orbits: Grossly clear. Other: None. IMPRESSION: 1. Significantly limited, essentially nondiagnostic, study secondary to patient motion artifact. A repeat study should be performed when patient is cooperative and able to tolerate scan. 2. No gross acute intracranial abnormality evident within the limitations. Electronically Signed   By: Davina Poke D.O.   On: 03/29/2020 13:14   MR ANGIO HEAD WO CONTRAST  Result Date: 04/02/2020 CLINICAL DATA:  75 year old female with recent altered mental status, delirium. Brain MRI 2 days ago demonstrating scattered acute, patchy bilateral cerebral hemisphere edema in a deep white matter, watershed pattern. EXAM: MRA HEAD WITHOUT CONTRAST TECHNIQUE: Angiographic images of the Circle of Willis were obtained using MRA technique without intravenous contrast. COMPARISON:  Brain MRI 03/31/2020.  Head CT 03/30/2020 and earlier. FINDINGS: No intracranial mass effect or midline shift. Ventricle size and configuration appears stable. Antegrade flow in the posterior circulation with fairly codominant distal vertebral arteries. No distal vertebral stenosis. Both PICA vessels appear to remain patent. Patent vertebrobasilar junction and basilar artery with mild tortuosity. No basilar stenosis. Normal SCA and PCA origins. Small bilateral posterior communicating arteries. Right PCA branches are within normal limits. There is a short segment moderate to severe stenosis in the left PCA P2 as seen on series 255, image 7. Preserved distal left PCA flow signal. Antegrade flow in both ICA siphons. Tortuous right ICA just below the skull base. No siphon stenosis. Ophthalmic and posterior communicating artery origins appear normal. Patent carotid termini, MCA and ACA origins. Right A1 appears mildly dominant. Anterior communicating artery, visible bilateral ACA branches and a median artery  of the corpus callosum are within normal limits. Right MCA M1 segment is patent with tortuosity but no stenosis. Patent right MCA bifurcation without stenosis. Visible right MCA branches are within normal limits. Left MCA proximal M1 is normal but there is a long segment of moderate to severe stenosis in the distal left MCA M1 as seen on series 252, image 14 and 253 image 8. This somewhat abates before the left MCA bifurcation which remains  patent. Left MCA branches appear within normal limits (on source images, some artifact is noted on the horizontal MIP images). IMPRESSION: 1. Negative for large vessel occlusion. 2. Short segment moderate to severe stenosis of the left PCA P2 segment. 3. 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 Electronically Signed   By: Genevie [name] M.D.   On: 04/02/2020 10:43   MR BRAIN WO CONTRAST  Result Date: 03/31/2020 CLINICAL DATA:  Delirium EXAM: MRI HEAD WITHOUT CONTRAST TECHNIQUE: Multiplanar, multiecho pulse sequences of the brain and surrounding structures were obtained without intravenous contrast. COMPARISON:  09/05/2017 FINDINGS: Brain: Multifocal bilateral abnormal diffusion restriction in a deep watershed pattern. The left hemispheres worse than the right. No acute hemorrhage. Two foci of chronic microhemorrhage in the right temporal lobe and brainstem. Hyperintense T2-weighted signal is moderately widespread throughout the white matter. Generalized volume loss without a clear lobar predilection. Old left cerebellar infarct. The midline structures are normal. Vascular: Major flow voids are preserved. Skull and upper cervical spine: Normal calvarium and skull base. Visualized upper cervical spine and soft tissues are normal. Sinuses/Orbits:No paranasal sinus fluid levels or advanced mucosal thickening. No mastoid or middle ear effusion. Normal orbits. IMPRESSION: Multifocal acute ischemia in a deep watershed pattern, left worse than right. No hemorrhage  or mass effect. Electronically Signed   By: Ulyses Jarred M.D.   On: 03/31/2020 00:57   DG Chest Portable 1 View  Result Date: 03/29/2020 CLINICAL DATA:  Altered mental status EXAM: PORTABLE CHEST 1 VIEW COMPARISON:  03/09/2020 FINDINGS: Central line tip remains in the proximal right atrium. Chronic elevation of the right hemidiaphragm. Chronic cardiomegaly and aortic atherosclerosis. No evidence of heart failure or effusion. The left lung is clear. Mild chronic volume loss at the right base related to the elevated hemidiaphragm. No acute bone finding. IMPRESSION: No acute disease. Chronic elevation of the right hemidiaphragm with mild chronic volume loss at the right base. Electronically Signed   By: Nelson Chimes M.D.   On: 03/29/2020 13:21   EEG adult  Result Date: 03/30/2020 Lora Havens, MD     03/30/2020  1:23 PM Patient Name: MELISSAANN DIZDAREVIC MRN: [id number] Epilepsy Attending: Lora Havens Referring Physician/Provider: Dr Fayrene Helper Date: 03/30/2020 Duration: 22.42 mins Patient history: 75yo F with ams. EEG to evaluate for seizure Level of alertness: Awake AEDs during EEG study: None Technical aspects: This EEG study was done with scalp electrodes positioned according to the 10-20 International system of electrode placement. Electrical activity was acquired at a sampling rate of 500Hz  and reviewed with a high frequency filter of 70Hz  and a low frequency filter of 1Hz . EEG data were recorded continuously and digitally stored. Description: EEG showed continuous generalized 3 to 6 Hz theta-delta slowing admixed with 15-18Hz  frontocentral beta activity. Physiologic photic driving was not seen during photic stimulation.  Hyperventilation was not performed.   ABNORMALITY -Continuous slow, generalized IMPRESSION: This study is suggestive of moderate diffuse encephalopathy, nonspecific etiology. No seizures or epileptiform discharges were seen throughout the recording. Lora Havens    ECHOCARDIOGRAM COMPLETE  Result Date: 03/31/2020    ECHOCARDIOGRAM REPORT   Patient Name:   JOURNIE HOWSON Date of Exam: 03/31/2020 Medical Rec #:  [id number]           Height:       64.0 in Accession #:    [id number]          Weight:       225.3  lb Date of Birth:  December 27, 1945           BSA:          2.058 m Patient Age:    39 years            BP:           116/61 mmHg Patient Gender: F                   HR:           82 bpm. Exam Location:  Inpatient Procedure: 2D Echo, Cardiac Doppler and Color Doppler Indications:    Stroke  History:        Patient has prior history of Echocardiogram examinations, most                 recent 09/08/2017.  Sonographer:    Merrie Roof RDCS Referring Phys: [phone] A CALDWELL POWELL JR IMPRESSIONS  1. LV intracavitary gradient with obstruction, max instantaneous gradient rest: 47 mmHg, Valsalva: 59 mmHg.  2. Left ventricular ejection fraction, by estimation, is 70 to 75%. The left ventricle has hyperdynamic function. The left ventricle has no regional wall motion abnormalities. There is moderate left ventricular hypertrophy. Left ventricular diastolic parameters are consistent with Grade I diastolic dysfunction (impaired relaxation).  3. Right ventricular systolic function is normal. The right ventricular size is normal. There is moderately elevated pulmonary artery systolic pressure. The estimated right ventricular systolic pressure is 79.8 mmHg.  4. The mitral valve is normal in structure. No evidence of mitral valve regurgitation. No evidence of mitral stenosis.  5. The aortic valve is grossly normal. Aortic valve regurgitation is not visualized. No aortic stenosis is present.  6. The inferior vena cava is normal in size with <50% respiratory variability, suggesting right atrial pressure of 8 mmHg. Conclusion(s)/Recommendation(s): No intracardiac source of embolism detected on this transthoracic study. A transesophageal echocardiogram can be considered to exclude cardiac  source of embolism if clinically indicated. FINDINGS  Left Ventricle: Left ventricular ejection fraction, by estimation, is 70 to 75%. The left ventricle has hyperdynamic function. The left ventricle has no regional wall motion abnormalities. The left ventricular internal cavity size was small. There is moderate left ventricular hypertrophy. Left ventricular diastolic parameters are consistent with Grade I diastolic dysfunction (impaired relaxation). Right Ventricle: The right ventricular size is normal. No increase in right ventricular wall thickness. Right ventricular systolic function is normal. There is moderately elevated pulmonary artery systolic pressure. The tricuspid regurgitant velocity is 3.12 m/s, and with an assumed right atrial pressure of 8 mmHg, the estimated right ventricular systolic pressure is 92.1 mmHg. Left Atrium: Left atrial size was normal in size. Right Atrium: Right atrial size was normal in size. Pericardium: There is no evidence of pericardial effusion. Mitral Valve: The mitral valve is normal in structure. No evidence of mitral valve regurgitation. No evidence of mitral valve stenosis. Tricuspid Valve: The tricuspid valve is normal in structure. Tricuspid valve regurgitation is mild . No evidence of tricuspid stenosis. Aortic Valve: The aortic valve is grossly normal. Aortic valve regurgitation is not visualized. No aortic stenosis is present. Pulmonic Valve: The pulmonic valve was not well visualized. Pulmonic valve regurgitation is trivial. No evidence of pulmonic stenosis. Aorta: The aortic root is normal in size and structure. Venous: The inferior vena cava is normal in size with less than 50% respiratory variability, suggesting right atrial pressure of 8 mmHg. IAS/Shunts: No atrial level shunt detected by color flow Doppler.  LEFT VENTRICLE PLAX 2D LVIDd:         3.20 cm  Diastology LVIDs:         1.90 cm  LV e' medial:    5.33 cm/s LV PW:         1.30 cm  LV E/e' medial:  11.3 LV  IVS:        1.30 cm  LV e' lateral:   7.72 cm/s LVOT diam:     1.90 cm  LV E/e' lateral: 7.8 LV SV:         65 LV SV Index:   32 LVOT Area:     2.84 cm  RIGHT VENTRICLE          IVC RV Basal diam:  3.70 cm  IVC diam: 1.80 cm LEFT ATRIUM             Index       RIGHT ATRIUM           Index LA diam:        3.30 cm 1.60 cm/m  RA Area:     18.20 cm LA Vol (A2C):   79.9 ml 38.82 ml/m RA Volume:   50.20 ml  24.39 ml/m LA Vol (A4C):   51.1 ml 24.83 ml/m LA Biplane Vol: 67.0 ml 32.56 ml/m  AORTIC VALVE LVOT Vmax:   112.00 cm/s LVOT VTI:    0.230 m  AORTA Ao Root diam: 3.20 cm MITRAL VALVE               TRICUSPID VALVE MV Area (PHT): 2.87 cm    TR Peak grad:   38.9 mmHg MV Decel Time: 264 msec    TR Vmax:        312.00 cm/s MV E velocity: 60.00 cm/s MV A velocity: 96.10 cm/s  SHUNTS MV E/A ratio:  0.62        Systemic VTI:  0.23 m                            Systemic Diam: 1.90 cm Cherlynn Kaiser MD Electronically signed by Cherlynn Kaiser MD Signature Date/Time: 03/31/2020/2:08:56 PM    Final    VAS US CAROTID (at [facility] and [facility] only)  Result Date: 04/01/2020 Carotid Arterial Duplex Study Indications:      CVA. Risk Factors:     Hypertension, hyperlipidemia, Diabetes, coronary artery                   disease. Comparison Study: previous 09/06/17 Performing Technologist: Abram Sander RVS  Examination Guidelines: A complete evaluation includes B-mode imaging, spectral Doppler, color Doppler, and power Doppler as needed of all accessible portions of each vessel. Bilateral testing is considered an integral part of a complete examination. Limited examinations for reoccurring indications may be performed as noted.  Right Carotid Findings: +----------+--------+--------+--------+------------------+--------+           PSV cm/sEDV cm/sStenosisPlaque DescriptionComments +----------+--------+--------+--------+------------------+--------+ CCA Prox  193     11              heterogenous                +----------+--------+--------+--------+------------------+--------+ CCA Distal100     12              heterogenous               +----------+--------+--------+--------+------------------+--------+ ICA Prox  69      17  1-39%   heterogenous               +----------+--------+--------+--------+------------------+--------+ ICA Distal58      16                                         +----------+--------+--------+--------+------------------+--------+ ECA       167                                       tortuous +----------+--------+--------+--------+------------------+--------+ +----------+--------+-------+--------------+-------------------+           PSV cm/sEDV cmsDescribe      Arm Pressure (mmHG) +----------+--------+-------+--------------+-------------------+ Subclavian               Not identified                    +----------+--------+-------+--------------+-------------------+ +---------+--------+--+--------+--+---------+ VertebralPSV cm/s52EDV cm/s12Antegrade +---------+--------+--+--------+--+---------+  Left Carotid Findings: +----------+--------+--------+--------+------------------+--------+           PSV cm/sEDV cm/sStenosisPlaque DescriptionComments +----------+--------+--------+--------+------------------+--------+ CCA Prox  140     18              heterogenous               +----------+--------+--------+--------+------------------+--------+ CCA Distal75      12              heterogenous               +----------+--------+--------+--------+------------------+--------+ ICA Prox  75      15      1-39%   heterogenous               +----------+--------+--------+--------+------------------+--------+ ICA Distal72      21                                         +----------+--------+--------+--------+------------------+--------+ ECA       103                                       tortuous  +----------+--------+--------+--------+------------------+--------+ +----------+--------+--------+--------+-------------------+           PSV cm/sEDV cm/sDescribeArm Pressure (mmHG) +----------+--------+--------+--------+-------------------+ [id number]                                         +----------+--------+--------+--------+-------------------+ +---------+--------+--+--------+--+---------+ VertebralPSV cm/s47EDV cm/s11Antegrade +---------+--------+--+--------+--+---------+   Summary: Right Carotid: Velocities in the right ICA are consistent with a 1-39% stenosis. Left Carotid: Velocities in the left ICA are consistent with a 1-39% stenosis. Vertebrals: Bilateral vertebral arteries demonstrate antegrade flow. *See table(s) above for measurements and observations.  Electronically signed by Antony Contras MD on 04/01/2020 at 12:52:38 PM.    Final      TODAY-DAY OF DISCHARGE:  Subjective:   Kathlen Brunswick today has had no major issues-lying comfortably in bed-calm-quiet-following simple commands.  Objective:   Blood pressure 112/68, pulse 72, temperature 97.6 F (36.4 C), temperature source Axillary, resp. rate 18, height 5\' 4"  (1.626 m), weight 97.2 kg, SpO2 99 %.  Intake/Output Summary (Last 24 hours) at 04/08/2020 1143 Last data filed at  04/07/2020 2100 Gross per 24 hour  Intake 50 ml  Output --  Net 50 ml   Filed Weights   04/06/20 1125 04/06/20 1501 04/08/20 0347  Weight: 97.8 kg 96.3 kg 97.2 kg    Exam: Awake-pleasantly confused, No new F.N deficits, Normal affect [place].AT,PERRAL Supple Neck,No JVD, No cervical lymphadenopathy appriciated.  Symmetrical Chest wall movement, Good air movement bilaterally, CTAB RRR,No Gallops,Rubs or new Murmurs, No Parasternal Heave +ve B.Sounds, Abd Soft, Non tender, No organomegaly appriciated, No rebound -guarding or rigidity. No Cyanosis, Clubbing or edema, No new Rash or bruise   PERTINENT RADIOLOGIC STUDIES: CT HEAD  WO CONTRAST  Result Date: 03/30/2020 CLINICAL DATA:  Delirium/altered mental status EXAM: CT HEAD WITHOUT CONTRAST TECHNIQUE: Contiguous axial images were obtained from the base of the skull through the vertex without intravenous contrast. COMPARISON:  March 29, 2020; March 09, 2020. FINDINGS: Brain: Mild to moderate diffuse atrophy is stable. There is no intracranial mass, hemorrhage, extra-axial fluid collection, or midline shift. There is patchy decreased attenuation throughout the centra semiovale bilaterally, stable. There is evidence of a prior small infarct in the mid left cerebellum laterally, stable. No acute infarct is evident. Vascular: No appreciable hyperdense vessel. There is calcification in each carotid siphon region. Skull: Bony calvarium appears intact. Sinuses/Orbits: There is an air-fluid level in the left sphenoid sinus. There is mucosal thickening in several ethmoid air cells. Mild mucosal thickening is noted in the inferior right maxillary antrum. Patient is status post cataract removal on the left. Orbits otherwise appear symmetric. Other: Mastoid air cells clear. 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 No acute infarct. No mass or hemorrhage. There are foci of arterial vascular calcification. There are foci of paranasal sinus disease. Note air-fluid level in the left sphenoid sinus. Electronically Signed   By: Lowella Grip III M.D.   On: 03/30/2020 10:28   CT HEAD WO CONTRAST  Result Date: 03/29/2020 CLINICAL DATA:  Altered mental status, delirium EXAM: CT HEAD WITHOUT CONTRAST TECHNIQUE: Contiguous axial images were obtained from the base of the skull through the vertex without intravenous contrast. COMPARISON:  03/09/2020 FINDINGS: Technical note: Examination is severely limited by patient motion artifact. Patient was uncooperative/combative. Study was repeated multiple times. Best possible images were  submitted. Brain: No evidence of acute large territory infarct, intracranial hemorrhage, or extra-axial collection on limited view. Vascular: Limited. Skull: No evidence of depressed calvarial fracture. Sinuses/Orbits: Grossly clear. Other: None. IMPRESSION: 1. Significantly limited, essentially nondiagnostic, study secondary to patient motion artifact. A repeat study should be performed when patient is cooperative and able to tolerate scan. 2. No gross acute intracranial abnormality evident within the limitations. Electronically Signed   By: Davina Poke D.O.   On: 03/29/2020 13:14   MR ANGIO HEAD WO CONTRAST  Result Date: 04/02/2020 CLINICAL DATA:  75 year old female with recent altered mental status, delirium. Brain MRI 2 days ago demonstrating scattered acute, patchy bilateral cerebral hemisphere edema in a deep white matter, watershed pattern. EXAM: MRA HEAD WITHOUT CONTRAST TECHNIQUE: Angiographic images of the Circle of Willis were obtained using MRA technique without intravenous contrast. COMPARISON:  Brain MRI 03/31/2020.  Head CT 03/30/2020 and earlier. FINDINGS: No intracranial mass effect or midline shift. Ventricle size and configuration appears stable. Antegrade flow in the posterior circulation with fairly codominant distal vertebral arteries. No distal vertebral stenosis. Both PICA vessels appear to remain patent. Patent vertebrobasilar junction and basilar artery with mild tortuosity. No basilar stenosis. Normal SCA  and PCA origins. Small bilateral posterior communicating arteries. Right PCA branches are within normal limits. There is a short segment moderate to severe stenosis in the left PCA P2 as seen on series 255, image 7. Preserved distal left PCA flow signal. Antegrade flow in both ICA siphons. Tortuous right ICA just below the skull base. No siphon stenosis. Ophthalmic and posterior communicating artery origins appear normal. Patent carotid termini, MCA and ACA origins. Right A1  appears mildly dominant. Anterior communicating artery, visible bilateral ACA branches and a median artery of the corpus callosum are within normal limits. Right MCA M1 segment is patent with tortuosity but no stenosis. Patent right MCA bifurcation without stenosis. Visible right MCA branches are within normal limits. Left MCA proximal M1 is normal but there is a long segment of moderate to severe stenosis in the distal left MCA M1 as seen on series 252, image 14 and 253 image 8. This somewhat abates before the left MCA bifurcation which remains patent. Left MCA branches appear within normal limits (on source images, some artifact is noted on the horizontal MIP images). IMPRESSION: 1. Negative for large vessel occlusion. 2. Short segment moderate to severe stenosis of the left PCA P2 segment. 3. 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 Electronically Signed   By: Genevie [name] M.D.   On: 04/02/2020 10:43   MR BRAIN WO CONTRAST  Result Date: 03/31/2020 CLINICAL DATA:  Delirium EXAM: MRI HEAD WITHOUT CONTRAST TECHNIQUE: Multiplanar, multiecho pulse sequences of the brain and surrounding structures were obtained without intravenous contrast. COMPARISON:  09/05/2017 FINDINGS: Brain: Multifocal bilateral abnormal diffusion restriction in a deep watershed pattern. The left hemispheres worse than the right. No acute hemorrhage. Two foci of chronic microhemorrhage in the right temporal lobe and brainstem. Hyperintense T2-weighted signal is moderately widespread throughout the white matter. Generalized volume loss without a clear lobar predilection. Old left cerebellar infarct. The midline structures are normal. Vascular: Major flow voids are preserved. Skull and upper cervical spine: Normal calvarium and skull base. Visualized upper cervical spine and soft tissues are normal. Sinuses/Orbits:No paranasal sinus fluid levels or advanced mucosal thickening. No mastoid or middle ear effusion. Normal  orbits. IMPRESSION: Multifocal acute ischemia in a deep watershed pattern, left worse than right. No hemorrhage or mass effect. Electronically Signed   By: Ulyses Jarred M.D.   On: 03/31/2020 00:57   DG Chest Portable 1 View  Result Date: 03/29/2020 CLINICAL DATA:  Altered mental status EXAM: PORTABLE CHEST 1 VIEW COMPARISON:  03/09/2020 FINDINGS: Central line tip remains in the proximal right atrium. Chronic elevation of the right hemidiaphragm. Chronic cardiomegaly and aortic atherosclerosis. No evidence of heart failure or effusion. The left lung is clear. Mild chronic volume loss at the right base related to the elevated hemidiaphragm. No acute bone finding. IMPRESSION: No acute disease. Chronic elevation of the right hemidiaphragm with mild chronic volume loss at the right base. Electronically Signed   By: Nelson Chimes M.D.   On: 03/29/2020 13:21   EEG adult  Result Date: 03/30/2020 Lora Havens, MD     03/30/2020  1:23 PM Patient Name: CHLOE MIYOSHI MRN: [id number] Epilepsy Attending: Lora Havens Referring Physician/Provider: Dr Fayrene Helper Date: 03/30/2020 Duration: 22.42 mins Patient history: 75yo F with ams. EEG to evaluate for seizure Level of alertness: Awake AEDs during EEG study: None Technical aspects: This EEG study was done with scalp electrodes positioned according to the 10-20 International system of electrode placement. Dealer  activity was acquired at a sampling rate of 500Hz  and reviewed with a high frequency filter of 70Hz  and a low frequency filter of 1Hz . EEG data were recorded continuously and digitally stored. Description: EEG showed continuous generalized 3 to 6 Hz theta-delta slowing admixed with 15-18Hz  frontocentral beta activity. Physiologic photic driving was not seen during photic stimulation.  Hyperventilation was not performed.   ABNORMALITY -Continuous slow, generalized IMPRESSION: This study is suggestive of moderate diffuse encephalopathy, nonspecific  etiology. No seizures or epileptiform discharges were seen throughout the recording. Lora Havens   ECHOCARDIOGRAM COMPLETE  Result Date: 03/31/2020    ECHOCARDIOGRAM REPORT   Patient Name:   TULA SCHRYVER Date of Exam: 03/31/2020 Medical Rec #:  [id number]           Height:       64.0 in Accession #:    [id number]          Weight:       225.3 lb Date of Birth:  10-Mar-1945           BSA:          2.058 m Patient Age:    23 years            BP:           116/61 mmHg Patient Gender: F                   HR:           82 bpm. Exam Location:  Inpatient Procedure: 2D Echo, Cardiac Doppler and Color Doppler Indications:    Stroke  History:        Patient has prior history of Echocardiogram examinations, most                 recent 09/08/2017.  Sonographer:    Merrie Roof RDCS Referring Phys: [phone] A CALDWELL POWELL JR IMPRESSIONS  1. LV intracavitary gradient with obstruction, max instantaneous gradient rest: 47 mmHg, Valsalva: 59 mmHg.  2. Left ventricular ejection fraction, by estimation, is 70 to 75%. The left ventricle has hyperdynamic function. The left ventricle has no regional wall motion abnormalities. There is moderate left ventricular hypertrophy. Left ventricular diastolic parameters are consistent with Grade I diastolic dysfunction (impaired relaxation).  3. Right ventricular systolic function is normal. The right ventricular size is normal. There is moderately elevated pulmonary artery systolic pressure. The estimated right ventricular systolic pressure is 46.9 mmHg.  4. The mitral valve is normal in structure. No evidence of mitral valve regurgitation. No evidence of mitral stenosis.  5. The aortic valve is grossly normal. Aortic valve regurgitation is not visualized. No aortic stenosis is present.  6. The inferior vena cava is normal in size with <50% respiratory variability, suggesting right atrial pressure of 8 mmHg. Conclusion(s)/Recommendation(s): No intracardiac source of embolism detected  on this transthoracic study. A transesophageal echocardiogram can be considered to exclude cardiac source of embolism if clinically indicated. FINDINGS  Left Ventricle: Left ventricular ejection fraction, by estimation, is 70 to 75%. The left ventricle has hyperdynamic function. The left ventricle has no regional wall motion abnormalities. The left ventricular internal cavity size was small. There is moderate left ventricular hypertrophy. Left ventricular diastolic parameters are consistent with Grade I diastolic dysfunction (impaired relaxation). Right Ventricle: The right ventricular size is normal. No increase in right ventricular wall thickness. Right ventricular systolic function is normal. There is moderately elevated pulmonary artery systolic pressure. The tricuspid regurgitant velocity is 3.12  m/s, and with an assumed right atrial pressure of 8 mmHg, the estimated right ventricular systolic pressure is 93.2 mmHg. Left Atrium: Left atrial size was normal in size. Right Atrium: Right atrial size was normal in size. Pericardium: There is no evidence of pericardial effusion. Mitral Valve: The mitral valve is normal in structure. No evidence of mitral valve regurgitation. No evidence of mitral valve stenosis. Tricuspid Valve: The tricuspid valve is normal in structure. Tricuspid valve regurgitation is mild . No evidence of tricuspid stenosis. Aortic Valve: The aortic valve is grossly normal. Aortic valve regurgitation is not visualized. No aortic stenosis is present. Pulmonic Valve: The pulmonic valve was not well visualized. Pulmonic valve regurgitation is trivial. No evidence of pulmonic stenosis. Aorta: The aortic root is normal in size and structure. Venous: The inferior vena cava is normal in size with less than 50% respiratory variability, suggesting right atrial pressure of 8 mmHg. IAS/Shunts: No atrial level shunt detected by color flow Doppler.  LEFT VENTRICLE PLAX 2D LVIDd:         3.20 cm  Diastology  LVIDs:         1.90 cm  LV e' medial:    5.33 cm/s LV PW:         1.30 cm  LV E/e' medial:  11.3 LV IVS:        1.30 cm  LV e' lateral:   7.72 cm/s LVOT diam:     1.90 cm  LV E/e' lateral: 7.8 LV SV:         65 LV SV Index:   32 LVOT Area:     2.84 cm  RIGHT VENTRICLE          IVC RV Basal diam:  3.70 cm  IVC diam: 1.80 cm LEFT ATRIUM             Index       RIGHT ATRIUM           Index LA diam:        3.30 cm 1.60 cm/m  RA Area:     18.20 cm LA Vol (A2C):   79.9 ml 38.82 ml/m RA Volume:   50.20 ml  24.39 ml/m LA Vol (A4C):   51.1 ml 24.83 ml/m LA Biplane Vol: 67.0 ml 32.56 ml/m  AORTIC VALVE LVOT Vmax:   112.00 cm/s LVOT VTI:    0.230 m  AORTA Ao Root diam: 3.20 cm MITRAL VALVE               TRICUSPID VALVE MV Area (PHT): 2.87 cm    TR Peak grad:   38.9 mmHg MV Decel Time: 264 msec    TR Vmax:        312.00 cm/s MV E velocity: 60.00 cm/s MV A velocity: 96.10 cm/s  SHUNTS MV E/A ratio:  0.62        Systemic VTI:  0.23 m                            Systemic Diam: 1.90 cm Cherlynn Kaiser MD Electronically signed by Cherlynn Kaiser MD Signature Date/Time: 03/31/2020/2:08:56 PM    Final    VAS US CAROTID (at [facility] and [facility] only)  Result Date: 04/01/2020 Carotid Arterial Duplex Study Indications:      CVA. Risk Factors:     Hypertension, hyperlipidemia, Diabetes, coronary artery  disease. Comparison Study: previous 09/06/17 Performing Technologist: Abram Sander RVS  Examination Guidelines: A complete evaluation includes B-mode imaging, spectral Doppler, color Doppler, and power Doppler as needed of all accessible portions of each vessel. Bilateral testing is considered an integral part of a complete examination. Limited examinations for reoccurring indications may be performed as noted.  Right Carotid Findings: +----------+--------+--------+--------+------------------+--------+           PSV cm/sEDV cm/sStenosisPlaque DescriptionComments  +----------+--------+--------+--------+------------------+--------+ CCA Prox  193     11              heterogenous               +----------+--------+--------+--------+------------------+--------+ CCA Distal100     12              heterogenous               +----------+--------+--------+--------+------------------+--------+ ICA Prox  69      17      1-39%   heterogenous               +----------+--------+--------+--------+------------------+--------+ ICA Distal58      16                                         +----------+--------+--------+--------+------------------+--------+ ECA       167                                       tortuous +----------+--------+--------+--------+------------------+--------+ +----------+--------+-------+--------------+-------------------+           PSV cm/sEDV cmsDescribe      Arm Pressure (mmHG) +----------+--------+-------+--------------+-------------------+ Subclavian               Not identified                    +----------+--------+-------+--------------+-------------------+ +---------+--------+--+--------+--+---------+ VertebralPSV cm/s52EDV cm/s12Antegrade +---------+--------+--+--------+--+---------+  Left Carotid Findings: +----------+--------+--------+--------+------------------+--------+           PSV cm/sEDV cm/sStenosisPlaque DescriptionComments +----------+--------+--------+--------+------------------+--------+ CCA Prox  140     18              heterogenous               +----------+--------+--------+--------+------------------+--------+ CCA Distal75      12              heterogenous               +----------+--------+--------+--------+------------------+--------+ ICA Prox  75      15      1-39%   heterogenous               +----------+--------+--------+--------+------------------+--------+ ICA Distal72      21                                          +----------+--------+--------+--------+------------------+--------+ ECA       103                                       tortuous +----------+--------+--------+--------+------------------+--------+ +----------+--------+--------+--------+-------------------+           PSV cm/sEDV cm/sDescribeArm Pressure (mmHG) +----------+--------+--------+--------+-------------------+ [id number]                                         +----------+--------+--------+--------+-------------------+ +---------+--------+--+--------+--+---------+  VertebralPSV cm/s47EDV cm/s11Antegrade +---------+--------+--+--------+--+---------+   Summary: Right Carotid: Velocities in the right ICA are consistent with a 1-39% stenosis. Left Carotid: Velocities in the left ICA are consistent with a 1-39% stenosis. Vertebrals: Bilateral vertebral arteries demonstrate antegrade flow. *See table(s) above for measurements and observations.  Electronically signed by Antony Contras MD on 04/01/2020 at 12:52:38 PM.    Final      PERTINENT LAB RESULTS: CBC: Recent Labs    04/06/20 1020  WBC 4.9  HGB 8.4*  HCT 27.3*  PLT 135*   CMET CMP     Component Value Date/Time   NA 140 04/06/2020 1037   K 3.5 04/06/2020 1037   CL 103 04/06/2020 1037   CO2 26 04/06/2020 1037   GLUCOSE 158 (H) 04/06/2020 1037   BUN 23 04/06/2020 1037   CREATININE 8.38 (H) 04/06/2020 1037   CREATININE 1.36 (H) 07/24/2011 0925   CALCIUM 8.2 (L) 04/06/2020 1037   CALCIUM 7.9 (L) 01/20/2020 1243   PROT 7.0 04/05/2020 0201   ALBUMIN 2.8 (L) 04/06/2020 1037   AST 17 04/05/2020 0201   ALT 8 04/05/2020 0201   ALKPHOS 55 04/05/2020 0201   BILITOT 0.9 04/05/2020 0201   GFRNONAA 5 (L) 04/06/2020 1037   GFRAA 13 (L) 10/01/2019 1217    GFR Estimated Creatinine Clearance: 6.7 mL/min (A) (by C-G formula based on SCr of 8.38 mg/dL (H)). No results for input(s): LIPASE, AMYLASE in the last 72 hours. No results for input(s): CKTOTAL, CKMB,  CKMBINDEX, TROPONINI in the last 72 hours. Invalid input(s): POCBNP No results for input(s): DDIMER in the last 72 hours. No results for input(s): HGBA1C in the last 72 hours. No results for input(s): CHOL, HDL, LDLCALC, TRIG, CHOLHDL, LDLDIRECT in the last 72 hours. No results for input(s): TSH, T4TOTAL, T3FREE, THYROIDAB in the last 72 hours.  Invalid input(s): FREET3 No results for input(s): VITAMINB12, FOLATE, FERRITIN, TIBC, IRON, RETICCTPCT in the last 72 hours. Coags: No results for input(s): INR in the last 72 hours.  Invalid input(s): PT Microbiology: Recent Results (from the past 240 hour(s))  SARS CORONAVIRUS 2 (TAT 6-24 HRS) Nasopharyngeal Nasopharyngeal Swab     Status: None   Collection Time: 03/30/20  1:48 AM   Specimen: Nasopharyngeal Swab  Result Value Ref Range Status   SARS Coronavirus 2 NEGATIVE NEGATIVE Final    Comment: (NOTE) SARS-CoV-2 target nucleic acids are NOT DETECTED.  The SARS-CoV-2 RNA is generally detectable in upper and lower respiratory specimens during the acute phase of infection. Negative results do not preclude SARS-CoV-2 infection, do not rule out co-infections with other pathogens, and should not be used as the sole basis for treatment or other patient management decisions. Negative results must be combined with clinical observations, patient history, and epidemiological information. The expected result is Negative.  Fact Sheet for Patients: SugarRoll.be  Fact Sheet for Healthcare Providers: https://www.woods-mathews.com/  This test is not yet approved or cleared by the [place] FDA and  has been authorized for detection and/or diagnosis of SARS-CoV-2 by FDA under an Emergency Use Authorization (EUA). This EUA will remain  in effect (meaning this test can be used) for the duration of the COVID-19 declaration under Se ction 564(b)(1) of the Act, 21 U.S.C. section 360bbb-3(b)(1), unless the  authorization is terminated or revoked sooner.  Performed at [facility], [place] [address] [place], [place] [address]   SARS CORONAVIRUS 2 (TAT 6-24 HRS) Nasopharyngeal Nasopharyngeal Swab     Status: None   Collection Time:  04/06/20  9:36 PM   Specimen: Nasopharyngeal Swab  Result Value Ref Range Status   SARS Coronavirus 2 NEGATIVE NEGATIVE Final    Comment: (NOTE) SARS-CoV-2 target nucleic acids are NOT DETECTED.  The SARS-CoV-2 RNA is generally detectable in upper and lower respiratory specimens during the acute phase of infection. Negative results do not preclude SARS-CoV-2 infection, do not rule out co-infections with other pathogens, and should not be used as the sole basis for treatment or other patient management decisions. Negative results must be combined with clinical observations, patient history, and epidemiological information. The expected result is Negative.  Fact Sheet for Patients: SugarRoll.be  Fact Sheet for Healthcare Providers: https://www.woods-mathews.com/  This test is not yet approved or cleared by the [place] FDA and  has been authorized for detection and/or diagnosis of SARS-CoV-2 by FDA under an Emergency Use Authorization (EUA). This EUA will remain  in effect (meaning this test can be used) for the duration of the COVID-19 declaration under Se ction 564(b)(1) of the Act, 21 U.S.C. section 360bbb-3(b)(1), unless the authorization is terminated or revoked sooner.  Performed at [facility], [place] [address] [place], [place] [address]     FURTHER DISCHARGE INSTRUCTIONS:  Get Medicines reviewed and adjusted: Please take all your medications with you for your next visit with your Primary MD  Laboratory/radiological data: Please request your Primary MD to go over all hospital tests and procedure/radiological results at the follow up, please ask your Primary MD to get all Hospital records  sent to his/her office.  In some cases, they will be blood work, cultures and biopsy results pending at the time of your discharge. Please request that your primary care M.D. goes through all the records of your hospital data and follows up on these results.  Also Note the following: If you experience worsening of your admission symptoms, develop shortness of breath, life threatening emergency, suicidal or homicidal thoughts you must seek medical attention immediately by calling 911 or calling your MD immediately  if symptoms less severe.  You must read complete instructions/literature along with all the possible adverse reactions/side effects for all the Medicines you take and that have been prescribed to you. Take any new Medicines after you have completely understood and accpet all the possible adverse reactions/side effects.   Do not drive when taking Pain medications or sleeping medications (Benzodaizepines)  Do not take more than prescribed Pain, Sleep and Anxiety Medications. It is not advisable to combine anxiety,sleep and pain medications without talking with your primary care practitioner  Special Instructions: If you have smoked or chewed Tobacco  in the last 2 yrs please stop smoking, stop any regular Alcohol  and or any Recreational drug use.  Wear Seat belts while driving.  Please note: You were cared for by a hospitalist during your hospital stay. Once you are discharged, your primary care physician will handle any further medical issues. Please note that NO REFILLS for any discharge medications will be authorized once you are discharged, as it is imperative that you return to your primary care physician (or establish a relationship with a primary care physician if you do not have one) for your post hospital discharge needs so that they can reassess your need for medications and monitor your lab values.  Total Time spent coordinating discharge including counseling, education and  face to face time equals 35 minutes.  SignedOren Binet 04/08/2020 11:43 AM

## 2020-04-07 NOTE — Plan of Care (Signed)

## 2020-04-07 NOTE — Progress Notes (Cosign Needed)
    Durable Medical Equipment  (From admission, onward)         Start     Ordered   04/07/20 1623  For home use only DME Hospital bed  Once       Question Answer Comment  Length of Need Lifetime   Patient has (list medical condition): Hx HD, cva, dementia, codp, FTT, ambulation dysfunction/bedbound   The above medical condition requires: Patient requires the ability to reposition frequently   Head must be elevated greater than: 30 degrees   Bed type Semi-electric   Hoyer Lift Yes   Support Surface: Gel Overlay      04/07/20 1625   04/07/20 1622  For home use only DME lightweight manual wheelchair with seat cushion  Once       Comments: Patient suffers from ambulation dysfunction/bedbound which impairs their ability to perform daily activities like walking in the home.  A rolling walker will not resolve  issue with performing activities of daily living. A wheelchair will allow patient to safely perform daily activities. Patient is not able to propel themselves in the home using a standard weight wheelchair due to weakness. Patient can self propel in the lightweight wheelchair. Length of need : life time. Accessories: elevating leg rests (ELRs), wheel locks, extensions and anti-tippers.   04/07/20 1625

## 2020-04-07 NOTE — Progress Notes (Signed)
[facility] ([facility])  Hospital Liaison: RN note         This patient has been referred to our palliative care services in the community.  [facility] will continue to follow for any discharge planning needs and to coordinate continuation of palliative care in the outpatient setting.    If you have questions or need assistance, please call [phone] or contact the hospital Liaison listed on AMION.      Thank you for this referral.         Mary Anne Robertson, RN, CCM  [facility] Hospital Liaison (listed on AMION under Hospice/[facility])    [phone]     

## 2020-04-07 NOTE — Progress Notes (Signed)
[facility] Progress Note   Subjective:     Patient seen and examined at bedside. She only expressed a few words. Appears comfortable. She is scheduled for discharge today.  Objective Vitals:   04/06/20 1501 04/06/20 1506 04/06/20 2030 04/07/20 0432  BP: (!) 90/45 (!) 111/54 113/61 (!) 143/102  Pulse:  79 82 71  Resp: _0 Temp:  97.7 F (36.5 C) 98 F (36.7 C) 98.2 F (36.8 C)  TempSrc:  Oral Oral Axillary  SpO2: 97%  98% 99%  Weight: 96.3 kg     Height:       Physical Exam General: Appears comfortable; No acute respiratory distress Heart: Normal S1 and S2; No murmurs, gallops, or friction rub Lungs: Clear anteriorly, Diminshed laterally Abdomen: Large, soft, non-tender Extremities: Trace edema RLE Dialysis Access: [facility]  Filed Weights   04/04/20 1051 04/06/20 1125 04/06/20 1501  Weight: 100.1 kg 97.8 kg 96.3 kg    Intake/Output Summary (Last 24 hours) at 04/07/2020 1127 Last data filed at 04/06/2020 2100 Gross per 24 hour  Intake 240 ml  Output 1500 ml  Net -1260 ml    Additional Objective Labs: Basic Metabolic Panel: Recent Labs  Lab 04/04/20 0847 04/05/20 0201 04/06/20 1037  NA 140 137 140  K 3.2* 3.3* 3.5  CL 101 101 103  CO2 _1 GLUCOSE 102* 125* 158*  BUN 25* 12 23  CREATININE 8.31* 5.65* 8.38*  CALCIUM 8.5* 8.4* 8.2*  PHOS 5.6* 4.1 5.6*   Liver Function Tests: Recent Labs  Lab 04/02/20 0548 04/03/20 0129 04/04/20 0847 04/05/20 0201 04/06/20 1037  AST 29 25  --  17  --   ALT 11 11  --  8  --   ALKPHOS 53 55  --  55  --   BILITOT 1.1 0.6  --  0.9  --   PROT 6.7 6.8  --  7.0  --   ALBUMIN 2.7* 2.7* 2.6* 2.7* 2.8*   No results for input(s): LIPASE, AMYLASE in the last 168 hours. CBC: Recent Labs  Lab 04/02/20 0548 04/03/20 0129 04/04/20 0847 04/05/20 0201 04/06/20 1020  WBC 5.2 5.1 5.1 6.4 4.9  NEUTROABS 1.6* 2.2  --  1.5*  --   HGB 7.5* 7.6* 7.8* 8.2* 8.4*  HCT 25.3* 24.6* 25.9* 27.5* 27.3*  MCV  79.3* 77.4* 78.0* 78.1* 78.7*  PLT 141* 124* 123* 115* 135*   Blood Culture    Component Value Date/Time   SDES IN/OUT CATH URINE 02/16/2020 1119   SPECREQUEST  02/16/2020 1119    NONE Performed at [facility] [address], [place], [place] [address]    CULT 20,000 COLONIES/mL ESCHERICHIA COLI (A) 02/16/2020 1119   REPTSTATUS 02/18/2020 FINAL 02/16/2020 1119    Cardiac Enzymes: No results for input(s): CKTOTAL, CKMB, CKMBINDEX, TROPONINI in the last 168 hours. CBG: Recent Labs  Lab 04/05/20 1625 04/05/20 2119 04/06/20 0810 04/06/20 1642 04/07/20 0743  GLUCAP 203* 143* 128* 126* 164*   Iron Studies: No results for input(s): IRON, TIBC, TRANSFERRIN, FERRITIN in the last 72 hours. Lab Results  Component Value Date   INR 1.2 02/15/2020   INR 1.04 09/13/2013   INR 1.1 (H) 02/26/2013   Studies/Results: No results found.  Medications: . ferric gluconate (FERRLECIT/NULECIT) IV 125 mg (04/04/20 1823)   . ascorbic acid  500 mg Oral Daily  . aspirin EC  81 mg Oral Daily  . atorvastatin  40 mg Oral Daily  .  calcium carbonate  1,250 mg Oral Daily  . Chlorhexidine Gluconate Cloth  6 each Topical Q0600  . clopidogrel  75 mg Oral Daily  . darbepoetin (ARANESP) injection - DIALYSIS  200 mcg Intravenous Q Thu-HD  . donepezil  5 mg Oral QHS  . doxercalciferol  3 mcg Intravenous Q T,Th,Sa-HD  . ezetimibe  10 mg Oral Daily  . fluticasone furoate-vilanterol  1 puff Inhalation Daily  . heparin  5,000 Units Subcutaneous Q12H  . insulin aspart  0-6 Units Subcutaneous TID WC  . LORazepam  0.5 mg Intravenous Once  . montelukast  10 mg Oral QHS  . OLANZapine zydis  5 mg Oral QHS  . pantoprazole  40 mg Oral Daily  . polyethylene glycol  17 g Oral BID  . potassium chloride  20 mEq Oral Once  . umeclidinium bromide  1 puff Inhalation Daily    Dialysis Orders: [facility] TTS 3.5h 400/800 107kg 2/2 bath RIJ TDC Hep none - hect 3ug tiw - mircera 225 q2 , last 3/3 -  venofer 50 mg per wk  Assessment/Plan:  1. AMS/Watershed distribution stroke - pt w/ underlying dementia. MRI brain with multifocal acute ischemia in watershed pattern. W/u per neuro/primary. Neurontin dc'd.Agitated again today. Refusing exam. Concerned that she will not be safe for OP HD D/T agitation.  2. ESRD - recent start to HD in Jan 2022. HD TTS. Patient scheduled for discharge today, will resume regular schedule HD on Sat 04/08/20 3. Hypokalemia. K now 3.5 Put on 3.0 K bath.  4. Recent COVID Pna admission 5. Dementia - advanced at baseline 6. HTN - cont meds as needed. BP/volume ok. Below outpatient dry weight.Will adjust EDW at discharge today. 7. Chronic debility- pt is bedbound 8. OSA-per primary 9. Anemia ckd -Hgb improved, now at 8.4. TransfusePRN.Ordered Aranesp 200 mcg IV for3/1722.  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 Cont VDRA 11. Goal of care: Met with Palliative Care and was made a DNR. Now apparently family disputing this decision, asking that this order be rescinded. At this moment, she is a DNR. Per primary and palliative care. However, if she becomes combative on hemodialysis, attempts to disrupt dialysis blood lines, etc, she will no longer be a candidate for OP HD.    , NP [facility] 04/07/2020,11:27 AM  LOS: 9 days   
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
## 2020-04-07 NOTE — TOC Progression Note (Signed)
Transition of Care [facility]) - Progression Note    Patient Details  Name: Joann Gutierrez MRN: [id number] Date of Birth: 12-06-1945  Transition of Care [facility]) CM/SW [place], LCSW Phone Number: 04/07/2020, 5:14 PM  Clinical Narrative:    1:44pm-CSW received denial from [facility] for SNF. Peer to peer requested from MD [phone]). CSW spoke with patient's son Joann Gutierrez to make him aware that if peer to peer is denied, patient may have to return home.  3pm-[employer] has denied peer to peer after completed with MD. CSW spoke with Joann Gutierrez to explain that since patient does not have Medicaid (Per [facility], family did not want to apply), patient will need to return home with home health services and palliative follow-up (since they want patient to continue with Dialysis, not hospice eligible). Joann Gutierrez reported understanding and is aware he can apply for Medicaid through DSS if he wants patient to go to SNF for long term care. He requested CSW contact patient's caregiver, Joann Gutierrez, to confirm equipment needs at the house. CSW spoke with Joann Gutierrez and provided an update on dc plan. He reported understanding and requested a hospital bed, hoyer lift, and wheelchair. Patient has oxygen, CPAP, and walker at home. She uses [facility] for transport to dialysis. She will require PTAR transport home. CSW confirmed address and PCP. Joann Gutierrez stated he would likely need time tomorrow to clear space for the hospital bed. CSW contacted [facility] and requested delivery of DME and for them to contact Joann Gutierrez to arrange time. [facility] will follow up. CSW sent home health referral to [facility] for review and it was accepted for Home Health RN/PT/OT/aide/SW pending orders and Face to Face. CSW sent referral to [facility] for Palliative outpatient services. Renal Navigator aware of plan. Will follow up with DME delivery tomorrow.       Expected Discharge Plan: [place] Barriers to Discharge: Continued  Medical Work up,Insurance Authorization  Expected Discharge Plan and Services Expected Discharge Plan: [place] In-house Referral: Clinical Social Work,Hospice / Palliative Care Discharge Planning Services: CM Consult Post Acute Care Choice: [place] Living arrangements for the past 2 months: [place] Expected Discharge Date: 04/07/20               DME Arranged: Hospital bed,Lightweight manual wheelchair with seat cushion (lift) DME Agency: [facility] Date DME Agency Contacted: 04/07/20     HH Arranged: PT,OT,RN,Nurse's Aide,Social Work [place]: [facility] [facility] [employer] Date [place]: 04/07/20   Representative spoke with at [place]: Joann Gutierrez   Social Determinants of Health ([place]) Interventions    Readmission Risk Interventions No flowsheet data found.

## 2020-04-08 LAB — RENAL FUNCTION PANEL
Albumin: 3 g/dL — ABNORMAL LOW (ref 3.5–5.0)
Anion gap: 13 (ref 5–15)
BUN: 28 mg/dL — ABNORMAL HIGH (ref 8–23)
CO2: 26 mmol/L (ref 22–32)
Calcium: 9.1 mg/dL (ref 8.9–10.3)
Chloride: 97 mmol/L — ABNORMAL LOW (ref 98–111)
Creatinine, Ser: 8.39 mg/dL — ABNORMAL HIGH (ref 0.44–1.00)
GFR, Estimated: 5 mL/min — ABNORMAL LOW (ref >60)
Glucose, Bld: 170 mg/dL — ABNORMAL HIGH (ref 70–99)
Phosphorus: 5.9 mg/dL — ABNORMAL HIGH (ref 2.5–4.6)
Potassium: 4 mmol/L (ref 3.5–5.1)
Sodium: 136 mmol/L (ref 135–145)

## 2020-04-08 LAB — CBC
HCT: 29.3 % — ABNORMAL LOW (ref 36.0–46.0)
Hemoglobin: 8.7 g/dL — ABNORMAL LOW (ref 12.0–15.0)
MCH: 23.5 pg — ABNORMAL LOW (ref 26.0–34.0)
MCHC: 29.7 g/dL — ABNORMAL LOW (ref 30.0–36.0)
MCV: 79.2 fL — ABNORMAL LOW (ref 80.0–100.0)
Platelets: 160 10*3/uL (ref 150–400)
RBC: 3.7 MIL/uL — ABNORMAL LOW (ref 3.87–5.11)
RDW: 20.2 % — ABNORMAL HIGH (ref 11.5–15.5)
WBC: 11.3 10*3/uL — ABNORMAL HIGH (ref 4.0–10.5)
nRBC: 0 % (ref 0.0–0.2)

## 2020-04-08 LAB — GLUCOSE, CAPILLARY
Glucose-Capillary: 139 mg/dL — ABNORMAL HIGH (ref 70–99)
Glucose-Capillary: 191 mg/dL — ABNORMAL HIGH (ref 70–99)
Glucose-Capillary: 100 mg/dL — ABNORMAL HIGH (ref 70–99)

## 2020-04-08 MED ORDER — TRELEGY ELLIPTA 100-62.5-25 MCG/INH IN AEPB: 1.0000 | INHALATION_SPRAY | Freq: Every day | RESPIRATORY_TRACT | 0 refills | Status: DC

## 2020-04-08 MED ORDER — ASPIRIN EC 81 MG PO TBEC: 81.0000 mg | DELAYED_RELEASE_TABLET | Freq: Every day | ORAL | 11 refills | Status: DC

## 2020-04-08 MED ORDER — AZELASTINE HCL 0.1 % NA SOLN: 1.0000 | Freq: Two times a day (BID) | NASAL | 0 refills | Status: DC | PRN

## 2020-04-08 MED ORDER — BISACODYL 5 MG PO TBEC: 5.0000 mg | DELAYED_RELEASE_TABLET | Freq: Every day | ORAL | 0 refills | Status: DC | PRN

## 2020-04-08 MED ORDER — MONTELUKAST SODIUM 10 MG PO TABS: 10.0000 mg | ORAL_TABLET | Freq: Every day | ORAL | 3 refills | Status: DC

## 2020-04-08 MED ORDER — DONEPEZIL HCL 5 MG PO TABS: 5.0000 mg | ORAL_TABLET | Freq: Every day | ORAL | 0 refills | Status: DC

## 2020-04-08 MED ORDER — ALBUTEROL SULFATE HFA 108 (90 BASE) MCG/ACT IN AERS: 2.0000 | INHALATION_SPRAY | RESPIRATORY_TRACT | 6 refills | Status: DC | PRN

## 2020-04-08 MED ORDER — FERROUS SULFATE 325 (65 FE) MG PO TABS: 325.0000 mg | ORAL_TABLET | Freq: Every day | ORAL | 0 refills | Status: DC

## 2020-04-08 MED ORDER — OMEPRAZOLE 40 MG PO CPDR: 40.0000 mg | DELAYED_RELEASE_CAPSULE | Freq: Two times a day (BID) | ORAL | 0 refills | Status: DC

## 2020-04-08 MED ORDER — IPRATROPIUM-ALBUTEROL 20-100 MCG/ACT IN AERS: 1.0000 | INHALATION_SPRAY | Freq: Four times a day (QID) | RESPIRATORY_TRACT | 0 refills | Status: DC | PRN

## 2020-04-08 MED ORDER — NITROGLYCERIN 0.4 MG SL SUBL: 0.4000 mg | SUBLINGUAL_TABLET | SUBLINGUAL | 0 refills | Status: DC | PRN

## 2020-04-08 MED ORDER — DARBEPOETIN ALFA 200 MCG/0.4ML IJ SOSY
PREFILLED_SYRINGE | INTRAMUSCULAR | Status: AC
  Filled 2020-04-08: qty 0.4

## 2020-04-08 MED ORDER — VITAMIN D3 25 MCG PO TABS: 1000.0000 [IU] | ORAL_TABLET | Freq: Every day | ORAL | 0 refills | Status: DC

## 2020-04-08 MED ORDER — CLOPIDOGREL BISULFATE 75 MG PO TABS: 75.0000 mg | ORAL_TABLET | Freq: Every day | ORAL | 0 refills | Status: DC

## 2020-04-08 MED ORDER — HEPARIN SODIUM (PORCINE) 1000 UNIT/ML IJ SOLN
INTRAMUSCULAR | Status: AC
  Administered 2020-04-08: 1000 [IU]
  Filled 2020-04-08: qty 4

## 2020-04-08 MED ORDER — DOXERCALCIFEROL 4 MCG/2ML IV SOLN
INTRAVENOUS | Status: AC
  Filled 2020-04-08: qty 2

## 2020-04-08 MED ORDER — EZETIMIBE 10 MG PO TABS: 10.0000 mg | ORAL_TABLET | Freq: Every day | ORAL | 0 refills | Status: DC

## 2020-04-08 MED ORDER — CALCIUM CARBONATE 1500 (600 CA) MG PO TABS: 1.0000 | ORAL_TABLET | Freq: Every day | ORAL | 0 refills | Status: DC

## 2020-04-08 MED ORDER — ASCORBIC ACID 500 MG PO TABS: 500.0000 mg | ORAL_TABLET | Freq: Every day | ORAL | 0 refills | Status: DC

## 2020-04-08 MED ORDER — POLYETHYLENE GLYCOL 3350 17 G PO PACK: 17.0000 g | PACK | Freq: Every day | ORAL | Status: DC | PRN

## 2020-04-08 MED ORDER — NEPRO PO LIQD
237.0000 mL | Freq: Two times a day (BID) | ORAL | Status: AC
Start: 2020-04-08 — End: 2020-05-08

## 2020-04-08 NOTE — Plan of Care (Signed)
  Problem: Education: Goal: Knowledge of General Education information will improve Description: Including pain rating scale, medication(s)/side effects and non-pharmacologic comfort measures Outcome: Adequate for Discharge   Problem: Health Behavior/Discharge Planning: Goal: Ability to manage health-related needs will improve Outcome: Adequate for Discharge   Problem: Clinical Measurements: Goal: Ability to maintain clinical measurements within normal limits will improve Outcome: Adequate for Discharge Goal: Will remain free from infection Outcome: Adequate for Discharge Goal: Diagnostic test results will improve Outcome: Adequate for Discharge Goal: Respiratory complications will improve Outcome: Adequate for Discharge Goal: Cardiovascular complication will be avoided Outcome: Adequate for Discharge   Problem: Activity: Goal: Risk for activity intolerance will decrease Outcome: Adequate for Discharge   Problem: Nutrition: Goal: Adequate nutrition will be maintained Outcome: Adequate for Discharge   Problem: Coping: Goal: Level of anxiety will decrease Outcome: Adequate for Discharge   Problem: Elimination: Goal: Will not experience complications related to bowel motility Outcome: Adequate for Discharge Goal: Will not experience complications related to urinary retention Outcome: Adequate for Discharge   Problem: Pain Managment: Goal: General experience of comfort will improve Outcome: Adequate for Discharge   Problem: Safety: Goal: Ability to remain free from injury will improve Outcome: Adequate for Discharge   Problem: Skin Integrity: Goal: Risk for impaired skin integrity will decrease Outcome: Adequate for Discharge   Problem: Education: Goal: Knowledge of disease or condition will improve Outcome: Adequate for Discharge Goal: Knowledge of secondary prevention will improve Outcome: Adequate for Discharge Goal: Knowledge of patient specific risk factors  addressed and post discharge goals established will improve Outcome: Adequate for Discharge Goal: Individualized Educational Video(s) Outcome: Adequate for Discharge   Problem: Coping: Goal: Will verbalize positive feelings about self Outcome: Adequate for Discharge Goal: Will identify appropriate support needs Outcome: Adequate for Discharge   Problem: Health Behavior/Discharge Planning: Goal: Ability to manage health-related needs will improve Outcome: Adequate for Discharge   Problem: Self-Care: Goal: Ability to participate in self-care as condition permits will improve Outcome: Adequate for Discharge Goal: Verbalization of feelings and concerns over difficulty with self-care will improve Outcome: Adequate for Discharge Goal: Ability to communicate needs accurately will improve Outcome: Adequate for Discharge   Problem: Nutrition: Goal: Risk of aspiration will decrease Outcome: Adequate for Discharge Goal: Dietary intake will improve Outcome: Adequate for Discharge   Problem: Ischemic Stroke/TIA Tissue Perfusion: Goal: Complications of ischemic stroke/TIA will be minimized Outcome: Adequate for Discharge

## 2020-04-08 NOTE — Progress Notes (Signed)
PT Cancellation Note  Patient Details Name: Joann Gutierrez MRN: [id number] DOB: April 14, 1945   Cancelled Treatment:    Reason Eval/Treat Not Completed: Patient at procedure or test/unavailable. Per chart, pt at HD then to d/c home afterwards. Please see PT sign off note from 04/06/20; reconsult if pt to remain admitted and new needs arise.  Mabeline Caras, PT, DPT Acute Rehabilitation Services  Pager [phone] Office [place] 04/08/2020, 1:44 PM

## 2020-04-08 NOTE — Progress Notes (Signed)
4:09AM Patient was combative and kept removing the chest leads, she becomes agitated when replaced.  This will be temporarily held at this time.  We shall continue to monitor patient

## 2020-04-08 NOTE — Progress Notes (Signed)
Patient taken by PTAR. IV removed and all belongings given to the patient and family.

## 2020-04-08 NOTE — Progress Notes (Signed)
PTAR is scheduled to pick up the patient at 8:00 pm to take her home.  Per Cephus Shelling, bed and other home supplies have been delivered.  Patient currently in dialysis.  Time of return to room not know.  If PTAR time needs to be adjusted call [phone], option 1 then option 3.

## 2020-04-08 NOTE — Progress Notes (Signed)
[facility] Progress Note   Subjective:     Patient seen and examined today at bedside. Patient's granddaughter and older sister also at bedside. Patient appears more awake. Communicating more today as well. Not agitated currently. Denies pain, SOB, ABD pain, and CP. Patient supposed to go to SNF; however, insurance denied SNF stay. Plan is for patient to be discharged today with [facility] home health services. Plan also for scheduled HD treatment today before discharge.   Objective Vitals:   04/07/20 0432 04/07/20 1249 04/08/20 0347 04/08/20 0450  BP: (!) 143/102 (!) 123/51  112/68  Pulse: 71 72    Resp: 18 18    Temp: 98.2 F (36.8 C) 97.6 F (36.4 C)    TempSrc: Axillary Axillary    SpO2: 99%     Weight:   97.2 kg   Height:       Physical Exam General: Appears comfortable; Not agitated; No acute respiratory distress Heart: Normal S1 and S2; No murmurs, gallops, or friction rub Lungs: Clear anteriorly, Diminshed laterally Abdomen: Large, soft, non-tender Extremities: Trace edema RLE Dialysis Access: [facility]  Filed Weights   04/06/20 1125 04/06/20 1501 04/08/20 0347  Weight: 97.8 kg 96.3 kg 97.2 kg    Intake/Output Summary (Last 24 hours) at 04/08/2020 1215 Last data filed at 04/07/2020 2100 Gross per 24 hour  Intake 50 ml  Output --  Net 50 ml    Additional Objective Labs: Basic Metabolic Panel: Recent Labs  Lab 04/04/20 0847 04/05/20 0201 04/06/20 1037  NA 140 137 140  K 3.2* 3.3* 3.5  CL 101 101 103  CO2 '28 25 26  ' GLUCOSE 102* 125* 158*  BUN 25* 12 23  CREATININE 8.31* 5.65* 8.38*  CALCIUM 8.5* 8.4* 8.2*  PHOS 5.6* 4.1 5.6*   Liver Function Tests: Recent Labs  Lab 04/02/20 0548 04/03/20 0129 04/04/20 0847 04/05/20 0201 04/06/20 1037  AST 29 25  --  17  --   ALT 11 11  --  8  --   ALKPHOS 53 55  --  55  --   BILITOT 1.1 0.6  --  0.9  --   PROT 6.7 6.8  --  7.0  --   ALBUMIN 2.7* 2.7* 2.6* 2.7* 2.8*   No results for input(s):  LIPASE, AMYLASE in the last 168 hours. CBC: Recent Labs  Lab 04/02/20 0548 04/03/20 0129 04/04/20 0847 04/05/20 0201 04/06/20 1020  WBC 5.2 5.1 5.1 6.4 4.9  NEUTROABS 1.6* 2.2  --  1.5*  --   HGB 7.5* 7.6* 7.8* 8.2* 8.4*  HCT 25.3* 24.6* 25.9* 27.5* 27.3*  MCV 79.3* 77.4* 78.0* 78.1* 78.7*  PLT 141* 124* 123* 115* 135*   Blood Culture    Component Value Date/Time   SDES IN/OUT CATH URINE 02/16/2020 1119   SPECREQUEST  02/16/2020 1119    NONE Performed at [facility] [address], [place], [place] [address]    CULT 20,000 COLONIES/mL ESCHERICHIA COLI (A) 02/16/2020 1119   REPTSTATUS 02/18/2020 FINAL 02/16/2020 1119   CBG: Recent Labs  Lab 04/07/20 0743 04/07/20 1247 04/07/20 1657 04/07/20 2110 04/08/20 0801  GLUCAP 164* 144* 132* 118* 139*   Iron Studies: No results for input(s): IRON, TIBC, TRANSFERRIN, FERRITIN in the last 72 hours. Lab Results  Component Value Date   INR 1.2 02/15/2020   INR 1.04 09/13/2013   INR 1.1 (H) 02/26/2013   Medications: . ferric gluconate (FERRLECIT/NULECIT) IV 125 mg (04/04/20 1823)   . ascorbic acid  500 mg Oral Daily  . aspirin EC  81 mg Oral Daily  . atorvastatin  40 mg Oral Daily  . calcium carbonate  1,250 mg Oral Daily  . Chlorhexidine Gluconate Cloth  6 each Topical Q0600  . Chlorhexidine Gluconate Cloth  6 each Topical Q0600  . clopidogrel  75 mg Oral Daily  . darbepoetin (ARANESP) injection - DIALYSIS  200 mcg Intravenous Q Sat-HD  . donepezil  5 mg Oral QHS  . doxercalciferol  3 mcg Intravenous Q T,Th,Sa-HD  . ezetimibe  10 mg Oral Daily  . fluticasone furoate-vilanterol  1 puff Inhalation Daily  . heparin  5,000 Units Subcutaneous Q12H  . insulin aspart  0-6 Units Subcutaneous TID WC  . LORazepam  0.5 mg Intravenous Once  . montelukast  10 mg Oral QHS  . OLANZapine zydis  5 mg Oral QHS  . pantoprazole  40 mg Oral Daily  . polyethylene glycol  17 g Oral BID  . potassium chloride  20 mEq Oral Once  .  umeclidinium bromide  1 puff Inhalation Daily    Dialysis Orders: [facility] 3.5h 400/800 107kg 2/2 bath RIJ TDC Hep none - hect 3ug tiw - mircera 225 q2 , last 3/3 - venofer 50 mg per wk  Assessment/Plan:  1. AMS/Watershed distribution stroke - pt w/ underlying dementia. MRI brain with multifocal acute ischemia in watershed pattern. W/u per neuro/primary. Patient more awake and communicating more. Not agitated at this time.  2. ESRD - recent start to HD in Jan 2022. HD TTS. Patient scheduled for discharge today, will resume regular schedule HD on Sat 04/08/20 3. Hypokalemia. K now 3.5 Put on 3.0 K bath.  4. Recent COVID Pna admission 5. Dementia - advanced at baseline 6. HTN - cont meds as needed. BP/volume ok. Below outpatient dry weight.Will adjust EDW at discharge today. 7. Chronic debility- pt is bedbound 8. OSA-per primary 9. Anemia ckd -Hgb improved, now at 8.4. TransfusePRN.Ordered Aranesp 200 mcg IV for3/1722.  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 Cont VDRA 11. Goal of care:Met with Palliative Care and was made a DNR. Now apparently family disputing this decision, asking that this order be rescinded. At this moment, she is a DNR. Per primary and palliative care. However, if she becomes combative on hemodialysis, attempts to disrupt dialysis blood lines, etc, she will no longer be a candidate for OP HD.Patient currently not agitated at this time and will continue to monitor behavior.   Tobie Poet, NP [facility] 04/08/2020,12:15 PM  LOS: 10 days

## 2020-04-08 NOTE — TOC Transition Note (Addendum)
Transition of Care [facility]) - CM/SW Discharge Note   Patient Details  Name: Joann Gutierrez MRN: [id number] Date of Birth: [birth year]  Transition of Care [facility]) CM/SW Contact:  Carles Collet, RN Phone Number: 04/08/2020, 11:05 AM   Clinical Narrative:    Spoke with patient's son who deferred to [facility], caregiver.  [name] states that he got a call from [employer] for delivery of bed, hoyer, wc and he deferred until Monday.  Discussed w [name] that her discharge cannot be delayed for convenience, and I would need to call [employer] to have them deliver equipment as patient has a discharge order. He requested that it be delivered later today, as he needs to clear a space for the bed. Agreed on later today.  Spoke w [employer] weekend supervisor Andree Coss who will arrange for delivery later today. Will arrange for medical transport once DME is delivered.  Medical necessity forms for transport are on chart.  Notified by attending that patient will have HD today on 2nd shift prior to DC  16:00 Verified with [employer] that bed and DME will be delivered to the home in the hour. Notified [name] who was accepting of DC. He states he will be at home waiting for the patient.  Patient is currently in HD. Will set up PTAR for 8pm.     Final next level of care: Home w Home Health Services Barriers to Discharge: Equipment Delay   Patient Goals and CMS Choice Patient states their goals for this hospitalization and ongoing recovery are:: patient unable to participate in goal setting, disoriented CMS Medicare.gov Compare Post Acute Care list provided to:: Patient Represenative (must comment) Choice offered to / list presented to : Adult Children  Discharge Placement                       Discharge Plan and Services In-house Referral: Clinical Social Work,Hospice / Palliative Care Discharge Planning Services: CM Consult Post Acute Care Choice: [place]          DME Arranged: Hospital  bed,Lightweight manual wheelchair with seat cushion (lift) DME Agency: [employer] Date DME Agency Contacted: 04/08/20 Time DME Agency Contacted: 1104 Representative spoke with at DME Agency: Andree Coss, weekend supervisor to arrange delivery of DME today HH Arranged: PT,OT,RN,Nurse's [place] Work [place]: [facility] [employer] Date [place]: 04/08/20 Time [place]: 1105 Representative spoke with at [place]: [place]  Social Determinants of Health ([place]) Interventions     Readmission Risk Interventions No flowsheet data found.

## 2020-04-08 NOTE — Progress Notes (Signed)
Patient was discharged yesterday to SNF-however her insurance denied any further SNF stay-social workers discussed with family-family agreeable to take her home with [facility].  Stable for discharge today.

## 2020-04-10 ENCOUNTER — Emergency Department (HOSPITAL_COMMUNITY): Payer: Medicare HMO

## 2020-04-10 ENCOUNTER — Emergency Department (HOSPITAL_COMMUNITY)
Admission: EM | Admit: 2020-04-10 | Discharge: 2020-04-15 | Disposition: A | Discharge status: Home / Self Care | Payer: Medicare HMO | Attending: Emergency Medicine | Admitting: Emergency Medicine

## 2020-04-10 ENCOUNTER — Other Ambulatory Visit: Payer: Self-pay

## 2020-04-10 DIAGNOSIS — I6389 Other cerebral infarction: Secondary | ICD-10-CM | POA: Diagnosis present

## 2020-04-10 DIAGNOSIS — N186 End stage renal disease: Secondary | ICD-10-CM | POA: Diagnosis not present

## 2020-04-10 DIAGNOSIS — J45909 Unspecified asthma, uncomplicated: Secondary | ICD-10-CM | POA: Diagnosis not present

## 2020-04-10 DIAGNOSIS — R627 Adult failure to thrive: Secondary | ICD-10-CM | POA: Diagnosis not present

## 2020-04-10 DIAGNOSIS — Z992 Dependence on renal dialysis: Secondary | ICD-10-CM

## 2020-04-10 DIAGNOSIS — Z7902 Long term (current) use of antithrombotics/antiplatelets: Secondary | ICD-10-CM | POA: Diagnosis not present

## 2020-04-10 DIAGNOSIS — E1122 Type 2 diabetes mellitus with diabetic chronic kidney disease: Secondary | ICD-10-CM | POA: Diagnosis not present

## 2020-04-10 DIAGNOSIS — I132 Hypertensive heart and chronic kidney disease with heart failure and with stage 5 chronic kidney disease, or end stage renal disease: Secondary | ICD-10-CM | POA: Insufficient documentation

## 2020-04-10 DIAGNOSIS — R410 Disorientation, unspecified: Secondary | ICD-10-CM | POA: Diagnosis not present

## 2020-04-10 DIAGNOSIS — I5032 Chronic diastolic (congestive) heart failure: Secondary | ICD-10-CM | POA: Insufficient documentation

## 2020-04-10 DIAGNOSIS — F0391 Unspecified dementia with behavioral disturbance: Secondary | ICD-10-CM | POA: Diagnosis present

## 2020-04-10 DIAGNOSIS — Z79899 Other long term (current) drug therapy: Secondary | ICD-10-CM | POA: Diagnosis not present

## 2020-04-10 DIAGNOSIS — I2510 Atherosclerotic heart disease of native coronary artery without angina pectoris: Secondary | ICD-10-CM | POA: Diagnosis not present

## 2020-04-10 DIAGNOSIS — Z87891 Personal history of nicotine dependence: Secondary | ICD-10-CM | POA: Diagnosis not present

## 2020-04-10 DIAGNOSIS — F0390 Unspecified dementia without behavioral disturbance: Secondary | ICD-10-CM | POA: Diagnosis not present

## 2020-04-10 DIAGNOSIS — E1169 Type 2 diabetes mellitus with other specified complication: Secondary | ICD-10-CM | POA: Diagnosis present

## 2020-04-10 DIAGNOSIS — E785 Hyperlipidemia, unspecified: Secondary | ICD-10-CM

## 2020-04-10 DIAGNOSIS — F03918 Unspecified dementia, unspecified severity, with other behavioral disturbance: Secondary | ICD-10-CM | POA: Diagnosis present

## 2020-04-10 DIAGNOSIS — Z7189 Other specified counseling: Secondary | ICD-10-CM | POA: Diagnosis not present

## 2020-04-10 LAB — CBC WITH DIFFERENTIAL/PLATELET
Basophils Absolute: 0 10*3/uL (ref 0.0–0.1)
Basophils Relative: 0 %
Eosinophils Absolute: 0 10*3/uL (ref 0.0–0.5)
Eosinophils Relative: 0 %
HCT: 35 % — ABNORMAL LOW (ref 36.0–46.0)
Hemoglobin: 10 g/dL — ABNORMAL LOW (ref 12.0–15.0)
Lymphocytes Relative: 17 %
Lymphs Abs: 2.8 10*3/uL (ref 0.7–4.0)
MCH: 23.9 pg — ABNORMAL LOW (ref 26.0–34.0)
MCHC: 28.6 g/dL — ABNORMAL LOW (ref 30.0–36.0)
MCV: 83.5 fL (ref 80.0–100.0)
Monocytes Absolute: 2.9 10*3/uL — ABNORMAL HIGH (ref 0.1–1.0)
Monocytes Relative: 18 %
Neutro Abs: 10.6 10*3/uL — ABNORMAL HIGH (ref 1.7–7.7)
Neutrophils Relative %: 65 %
Platelets: 149 10*3/uL — ABNORMAL LOW (ref 150–400)
RBC: 4.19 MIL/uL (ref 3.87–5.11)
RDW: 20.3 % — ABNORMAL HIGH (ref 11.5–15.5)
WBC: 16.3 10*3/uL — ABNORMAL HIGH (ref 4.0–10.5)
nRBC: 0.1 % (ref 0.0–0.2)

## 2020-04-10 LAB — COMPREHENSIVE METABOLIC PANEL
ALT: 8 U/L (ref 0–44)
AST: 17 U/L (ref 15–41)
Albumin: 3.1 g/dL — ABNORMAL LOW (ref 3.5–5.0)
Alkaline Phosphatase: 69 U/L (ref 38–126)
Anion gap: 16 — ABNORMAL HIGH (ref 5–15)
BUN: 24 mg/dL — ABNORMAL HIGH (ref 8–23)
CO2: 23 mmol/L (ref 22–32)
Calcium: 9.4 mg/dL (ref 8.9–10.3)
Chloride: 104 mmol/L (ref 98–111)
Creatinine, Ser: 7.81 mg/dL — ABNORMAL HIGH (ref 0.44–1.00)
GFR, Estimated: 5 mL/min — ABNORMAL LOW (ref >60)
Glucose, Bld: 113 mg/dL — ABNORMAL HIGH (ref 70–99)
Potassium: 4.5 mmol/L (ref 3.5–5.1)
Sodium: 143 mmol/L (ref 135–145)
Total Bilirubin: 0.8 mg/dL (ref 0.3–1.2)
Total Protein: 8.5 g/dL — ABNORMAL HIGH (ref 6.5–8.1)

## 2020-04-10 LAB — AMMONIA: Ammonia: 20 umol/L (ref 9–35)

## 2020-04-10 LAB — CBG MONITORING, ED: Glucose-Capillary: 107 mg/dL — ABNORMAL HIGH (ref 70–99)

## 2020-04-10 LAB — TROPONIN I (HIGH SENSITIVITY)
Troponin I (High Sensitivity): 18 ng/L — ABNORMAL HIGH (ref <18)
Troponin I (High Sensitivity): 18 ng/L — ABNORMAL HIGH (ref <18)

## 2020-04-10 MED ORDER — ALBUTEROL SULFATE HFA 108 (90 BASE) MCG/ACT IN AERS: 2.0000 | INHALATION_SPRAY | RESPIRATORY_TRACT | Status: DC | PRN

## 2020-04-10 MED ORDER — LIP MEDEX EX OINT: TOPICAL_OINTMENT | CUTANEOUS | Status: DC | PRN

## 2020-04-10 MED ORDER — FLUTICASONE-UMECLIDIN-VILANT 100-62.5-25 MCG/INH IN AEPB: 1.0000 | INHALATION_SPRAY | Freq: Every day | RESPIRATORY_TRACT | Status: DC

## 2020-04-10 MED ORDER — UMECLIDINIUM BROMIDE 62.5 MCG/INH IN AEPB
1.0000 | INHALATION_SPRAY | Freq: Every day | RESPIRATORY_TRACT | Status: DC
  Filled 2020-04-10: qty 7

## 2020-04-10 MED ORDER — VITAMIN D 25 MCG (1000 UNIT) PO TABS
1000.0000 [IU] | ORAL_TABLET | Freq: Every day | ORAL | Status: DC
  Administered 2020-04-12: 1000 [IU] via ORAL
  Filled 2020-04-10 (×3): qty 1

## 2020-04-10 MED ORDER — FERROUS SULFATE 325 (65 FE) MG PO TABS
325.0000 mg | ORAL_TABLET | Freq: Every day | ORAL | Status: DC
  Administered 2020-04-12: 325 mg via ORAL
  Filled 2020-04-10 (×3): qty 1

## 2020-04-10 MED ORDER — DONEPEZIL HCL 5 MG PO TABS
5.0000 mg | ORAL_TABLET | Freq: Every day | ORAL | Status: DC
  Administered 2020-04-11 – 2020-04-14 (×4): 5 mg via ORAL
  Filled 2020-04-10 (×6): qty 1

## 2020-04-10 MED ORDER — FLUTICASONE FUROATE-VILANTEROL 100-25 MCG/INH IN AEPB
1.0000 | INHALATION_SPRAY | Freq: Every day | RESPIRATORY_TRACT | Status: DC
  Filled 2020-04-10: qty 28

## 2020-04-10 MED ORDER — CLOPIDOGREL BISULFATE 75 MG PO TABS
75.0000 mg | ORAL_TABLET | Freq: Every day | ORAL | Status: DC
  Administered 2020-04-12: 75 mg via ORAL
  Filled 2020-04-10 (×3): qty 1

## 2020-04-10 MED ORDER — ASPIRIN EC 81 MG PO TBEC
81.0000 mg | DELAYED_RELEASE_TABLET | Freq: Every day | ORAL | Status: DC
  Administered 2020-04-12: 81 mg via ORAL
  Filled 2020-04-10 (×3): qty 1

## 2020-04-10 MED ORDER — EZETIMIBE 10 MG PO TABS
10.0000 mg | ORAL_TABLET | Freq: Every day | ORAL | Status: DC
  Administered 2020-04-12: 10 mg via ORAL
  Filled 2020-04-10 (×5): qty 1

## 2020-04-10 MED ORDER — IPRATROPIUM-ALBUTEROL 20-100 MCG/ACT IN AERS: 1.0000 | INHALATION_SPRAY | Freq: Four times a day (QID) | RESPIRATORY_TRACT | Status: DC | PRN

## 2020-04-10 MED ORDER — MONTELUKAST SODIUM 10 MG PO TABS
10.0000 mg | ORAL_TABLET | Freq: Every day | ORAL | Status: DC
  Administered 2020-04-12: 10 mg via ORAL
  Filled 2020-04-10 (×5): qty 1

## 2020-04-10 MED ORDER — ACETAMINOPHEN 325 MG PO TABS: 650.0000 mg | ORAL_TABLET | ORAL | Status: DC | PRN

## 2020-04-10 MED ORDER — CALCIUM CARBONATE 1250 (500 CA) MG PO TABS
1.0000 | ORAL_TABLET | Freq: Every day | ORAL | Status: DC
  Administered 2020-04-12: 500 mg via ORAL
  Filled 2020-04-10 (×4): qty 1

## 2020-04-10 MED ORDER — GUAIFENESIN-DM 100-10 MG/5ML PO SYRP: 10.0000 mL | ORAL_SOLUTION | ORAL | Status: DC | PRN

## 2020-04-10 MED ORDER — OLANZAPINE 5 MG PO TBDP
5.0000 mg | ORAL_TABLET | Freq: Every day | ORAL | Status: DC
  Administered 2020-04-11 – 2020-04-14 (×4): 5 mg via ORAL
  Filled 2020-04-10 (×7): qty 1

## 2020-04-10 MED ORDER — SODIUM CHLORIDE 0.9 % IV BOLUS
500.0000 mL | Freq: Once | INTRAVENOUS | Status: AC
  Administered 2020-04-10: 500 mL via INTRAVENOUS

## 2020-04-10 MED ORDER — POLYETHYLENE GLYCOL 3350 17 G PO PACK: 17.0000 g | PACK | Freq: Every day | ORAL | Status: DC | PRN

## 2020-04-10 NOTE — Progress Notes (Signed)
CSW spoke with [place] @ [facility] @ [phone]  Per [place], [place] will not continue with Pt as home is unsafe for agency staff. [place] further states that care is not adequate for Pt as caregiver and family members are actively engaging in etoh substance use and not caring for Pt.  EDP updated. TOC leadership updated.

## 2020-04-10 NOTE — ED Provider Notes (Cosign Needed Addendum)
Physical Exam  BP (!) 108/58   Pulse 82   Temp 98.6 F (37 C) (Oral)   Resp 16   Physical Exam Vitals and nursing note reviewed.  Constitutional:      General: She is not in acute distress.    Appearance: She is well-developed. She is not diaphoretic.  HENT:     Head: Normocephalic and atraumatic.  Eyes:     General: No scleral icterus.    Conjunctiva/sclera: Conjunctivae normal.  Pulmonary:     Effort: Pulmonary effort is normal. No respiratory distress.  Musculoskeletal:     Cervical back: Normal range of motion.  Skin:    Findings: No rash.  Neurological:     Mental Status: She is alert.     Comments: Moving all extremities without difficulty.     ED Course/Procedures   Clinical Course as of 04/10/20 2343  Mon Apr 10, 2020  1715 [facility] team states that patient does not qualify for SNF due to financial situation. [HK]  [id number] RN states that patient now hypotensive to 84O systolic.  Will give 500 cc bolus and obtain blood cultures. [HK]  1838 Spoke to Dr. Cheral Marker, neurologist.  Reviewed patient's prior imaging as well as today's lab work.  He is recommending MRI of the brain but does also feel that encephalopathy work-up including infectious causes is important at this time.  I have ordered an MRI. [HK]  1846 Blood pressure improved to 962 systolic with repositioning. [HK]  1906 BP: 122/60 [HK]    Clinical Course User Index [HK] Delia Heady, PA-C    Procedures  MDM    Care of patient assumed from [place] at 3:30 PM.  Agree with history, physical exam and plan.  See their note for further details.  Briefly, 75 y.o. female with PMH/PSH as below who presents with altered mental status.  Admitted to the hospital for ongoing failure to thrive, presenting at that time with altered mental status and found to have CVA.  She was discharged home on 04/07/2020 and was denied SNF services due to insurance issues. Unreliable historian and family unable to provide history accurately  also.  Apparently patient requested to go back to the ER, 1 relatives said that she has been altered since she left the hospital and other one said that it started yesterday at noon.  They do report that she has not been eating.  Home health nurse has not come to the home yet apparently. Has been going to dialysis regularly. Patient moving all extremities on exam.    Past Medical History:  Diagnosis Date  . Adenomatous colon polyp   . Allergy   . Anemia   . Asthma       . CAD (coronary artery disease)    Mild very minimal coronary disease with 20% obtuse marginal stenosis  . Carpal tunnel syndrome on left   . Cataract   . CHF (congestive heart failure) ([place])   . Chronic kidney disease (CKD), stage III (moderate) (HCC)    now stage 4  . COPD (chronic obstructive pulmonary disease) ([place])   . Dementia with behavioral disturbance ([place]) 03/09/2020  . Depression   . 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 Diverticulosis   . Dyspnea   . 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 Esophageal dysmotility   . Esophageal stricture   . ESRD on hemodialysis ([place]) 03/09/2020  . Gastritis   . Gastroparesis 08/21/2007  . GERD (gastroesophageal  reflux disease)   . Hearing loss of both ears   . Hernia, hiatal   . Hyperlipidemia   . Hypertension   . Morbid obesity ([place])   . OSTEOARTHRITIS 08/09/2006  . Osteoporosis   . Oxygen deficiency    prn   . 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 RENAL INSUFFICIENCY 02/16/2009  . 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 Seizures ([place])    pt thinks it has been several monthes since she had a seisure  . Shingles   . Sickle cell trait ([place])   . Sleep apnea    uses cpap  . Stroke [facility])    Past Surgical History:  Procedure Laterality Date  . ABDOMINAL HYSTERECTOMY    . ARTERY BIOPSY  01/07/2011   Procedure: MINOR BIOPSY TEMPORAL ARTERY;  Surgeon: Haywood Lasso, MD;  Location: [place];  Service: General;  Laterality:  Left;  left temporal artery biopsy  . AV FISTULA PLACEMENT Right 11/16/2018   Procedure: CREATION RIGHT BRACHIOCEPHALIC FISTULA  ARTERIOVENOUS FISTULA;  Surgeon: Angelia Mould, MD;  Location: [place];  Service: Vascular;  Laterality: Right;  . bil foot surgery    . BREAST LUMPECTOMY     benign  . BREAST LUMPECTOMY     both breast lumps removed   . CATARACT EXTRACTION Left 06/2016   Dr. Read Drivers  . COLONOSCOPY    . COLONOSCOPY WITH PROPOFOL N/A 07/05/2015   Procedure: COLONOSCOPY WITH PROPOFOL;  Surgeon: Manus Gunning, MD;  [facility]: [facility];  Service: Gastroenterology;  Laterality: N/A;  . COLONOSCOPY WITH PROPOFOL N/A 08/04/2018   Procedure: COLONOSCOPY WITH PROPOFOL;  Surgeon: Yetta Flock, MD;  Location: [facility];  Service: Gastroenterology;  Laterality: N/A;  . ERD  08/08/2000  . ESOPHAGEAL MANOMETRY N/A 03/13/2015   Procedure: ESOPHAGEAL MANOMETRY (EM);  Surgeon: Manus Gunning, MD;  [facility]: [facility];  Service: Gastroenterology;  Laterality: N/A;  . ESOPHAGOGASTRODUODENOSCOPY  06/25/2006  . ESOPHAGOGASTRODUODENOSCOPY (EGD) WITH PROPOFOL N/A 07/05/2015   Procedure: ESOPHAGOGASTRODUODENOSCOPY (EGD) WITH PROPOFOL;  Surgeon: Manus Gunning, MD;  [facility]: [facility];  Service: Gastroenterology;  Laterality: N/A;  . FISTULA SUPERFICIALIZATION Right 11/16/2018   Procedure: Fistula Superficialization;  Surgeon: Angelia Mould, MD;  Location: [place];  Service: Vascular;  Laterality: Right;  . HERNIA REPAIR    . IR PERC TUN PERIT CATH WO PORT S&I [place]  02/17/2020  . IR US GUIDE VASC ACCESS RIGHT  02/18/2020  . LEFT AND RIGHT HEART CATHETERIZATION WITH CORONARY ANGIOGRAM N/A 03/03/2013   Procedure: LEFT AND RIGHT HEART CATHETERIZATION WITH CORONARY ANGIOGRAM;  Surgeon: Minus Breeding, MD;  Location: [facility] C[facility] LAB;  Service: Cardiovascular;  Laterality: N/A;  . REPLACEMENT TOTAL KNEE Left 1998  . UPPER GASTROINTESTINAL ENDOSCOPY         Current Plan: Obtain repeat lab work and CT of the head to evaluate for altered mental status.  Consider MRI versus possible admission for this altered mental status.   MDM/ED Course:  Lab work here shows initial and delta troponin of 18.  Ammonia level is normal. EKG without evidence of ischemia. She has a leukocytosis of 16.  This is new from 2 days ago. No source of infection found. Creatinine and BUN consistent with her known history of ESRD.  Normal potassium level here. Patient does not make urine so urine unable to be collected. She did have a brief episode of hypotension with blood pressure in 69S systolic although I am unsure if this is related  to positioning as this improved without intervention to the 622 systolic. CT of the head without any abnormalities. MRI shows unchanged subacute watershed infarcts.  Chest x-ray without any acute findings. I consulted Dr. Cheral Marker, neurologist who recommended MRI but no further work-up from a neurology standpoint based on her recent stroke work-up last week. Patient remained hemodynamically stable here.  I consulted hospitalist regarding potential readmission for altered mental status.  They do not feel that patient warrants readmission at this time. Evaluated at the bedside. Recommend continuing home medications. Will board due to unsafe housing and home health unable to help due to her current living situation. TOC team on board. Please see their note for further detail. Patient will require dialysis tomorrow and as long as she is boarding here in the ER. Will need nephrology consultation regarding this.  Consults: Neurology- Dr. Cheral Marker Hospitalist- Dr. Alcario Drought   Significant labs/images: Labs Reviewed  COMPREHENSIVE METABOLIC PANEL - Abnormal; Notable for the following components:      Result Value   Glucose, Bld 113 (*)    BUN 24 (*)    Creatinine, Ser 7.81 (*)    Total Protein 8.5 (*)    Albumin 3.1 (*)    GFR, Estimated 5  (*)    Anion gap 16 (*)    All other components within normal limits  CBC WITH DIFFERENTIAL/PLATELET - Abnormal; Notable for the following components:   WBC 16.3 (*)    Hemoglobin 10.0 (*)    HCT 35.0 (*)    MCH 23.9 (*)    MCHC 28.6 (*)    RDW 20.3 (*)    Platelets 149 (*)    Neutro Abs 10.6 (*)    Monocytes Absolute 2.9 (*)    All other components within normal limits  CBG MONITORING, ED - Abnormal; Notable for the following components:   Glucose-Capillary 107 (*)    All other components within normal limits  TROPONIN I (HIGH SENSITIVITY) - Abnormal; Notable for the following components:   Troponin I (High Sensitivity) 18 (*)    All other components within normal limits  TROPONIN I (HIGH SENSITIVITY) - Abnormal; Notable for the following components:   Troponin I (High Sensitivity) 18 (*)    All other components within normal limits  CULTURE, BLOOD (ROUTINE X 2)  CULTURE, BLOOD (ROUTINE X 2)  AMMONIA  PATHOLOGIST SMEAR REVIEW    I personally reviewed and interpreted all labs.  The plan for this patient was discussed with Dr. Roslynn Amble, who voiced agreement and who oversaw evaluation and treatment of this patient.   Portions of this note were generated with [employer]. Dictation errors may occur despite best attempts at proofreading.     Delia Heady, PA-C 04/10/20 2344    Lucrezia Starch, MD 04/11/20 [phone]

## 2020-04-10 NOTE — Progress Notes (Signed)
[facility] Joann Gutierrez has a current open case

## 2020-04-10 NOTE — Progress Notes (Signed)
Per chart review, Pt was denied for SNF on 04/08/20 after peer-to-peer review. CSW spoke with helper/friend [name] via phone @ [phone] [name] states that Pt does have home health set up and that they have received all of the DME requested.  CSW will continue to follow.

## 2020-04-10 NOTE — ED Notes (Signed)
IV team will reassess pt. Veins. Unable to place at the moment. Pt. BP 107/77 and provider is aware.

## 2020-04-10 NOTE — Consult Note (Incomplete)
Medical Consultation   Joann Gutierrez  TDD:[id number]  DOB: 1945-08-24  DOA: 04/10/2020  PCP: Andree Moro, DO    Requesting physician: Delia Heady PA-C  Reason for consultation: AMS   History of Present Illness: Joann Gutierrez is an 75 y.o. 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  Pt recently admitted to our service on 3/9 for confusion, AMS, intermittent agitation as well as ongoing adult failure to thrive with ongoing decline in functional status since Jan this year.  Unfortunately work up during that admission demonstrated acute watershed infarcts.  Throughout the admission she continued to be bed bound status, have poor PO intake, and have intermittent confusion and agitation.  Agitation seemed to improve some with zyprexa though was still present intermittently as recently as the morning of discharge 3/19.  Her prognosis was felt to be poor and although she was made DNR on 3/15 by family, they later revoked this on 3/17.  It was felt that she was stable for discharge to SNF; however, her insurance company, in their infinite wisdom, unfortunately denied SNF.  Thus she had to be discharged home with home health instead.  Unfortunately, pt returns to ED today: home health has apparently fired pt/family (see SW note)  She continues to have ongoing confusion and poor appetite per family members.  Pt is awake, pleasantly confused, denies pain anywhere when asked.  Says she is hungry when asked.  Now eating a sandwich after my evaluation.  Review of Systems:  ROS As per HPI otherwise 10 point review of systems negative.     Past Medical History: Past Medical History:  Diagnosis Date  . Adenomatous colon polyp   . Allergy   . Anemia   . Asthma       . CAD (coronary artery disease)    Mild very minimal coronary disease with 20% obtuse marginal stenosis  . Carpal tunnel syndrome on left   . Cataract   . CHF (congestive heart failure) ([place])    . Chronic kidney disease (CKD), stage III (moderate) (HCC)    now stage 4  . COPD (chronic obstructive pulmonary disease) ([place])   . Dementia with behavioral disturbance ([place]) 03/09/2020  . Depression   . 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 Diverticulosis   . Dyspnea   . 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 Esophageal dysmotility   . Esophageal stricture   . ESRD on hemodialysis ([place]) 03/09/2020  . Gastritis   . Gastroparesis 08/21/2007  . GERD (gastroesophageal reflux disease)   . Hearing loss of both ears   . Hernia, hiatal   . Hyperlipidemia   . Hypertension   . Morbid obesity ([place])   . OSTEOARTHRITIS 08/09/2006  . Osteoporosis   . Oxygen deficiency    prn   . 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 RENAL INSUFFICIENCY 02/16/2009  . 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 Seizures ([place])    pt thinks it has been several monthes since she had a seisure  . Shingles   . Sickle cell trait ([place])   . Sleep apnea    uses cpap  . Stroke [facility])     Past Surgical History: Past Surgical History:  Procedure Laterality Date  . ABDOMINAL HYSTERECTOMY    . ARTERY BIOPSY  01/07/2011   Procedure: MINOR BIOPSY TEMPORAL ARTERY;  Surgeon: Haywood Lasso, MD;  Location:  [place];  Service: General;  Laterality: Left;  left temporal artery biopsy  . AV FISTULA PLACEMENT Right 11/16/2018   Procedure: CREATION RIGHT BRACHIOCEPHALIC FISTULA  ARTERIOVENOUS FISTULA;  Surgeon: Angelia Mould, MD;  Location: [place];  Service: Vascular;  Laterality: Right;  . bil foot surgery    . BREAST LUMPECTOMY     benign  . BREAST LUMPECTOMY     both breast lumps removed   . CATARACT EXTRACTION Left 06/2016   Dr. Read Drivers  . COLONOSCOPY    . COLONOSCOPY WITH PROPOFOL N/A 07/05/2015   Procedure: COLONOSCOPY WITH PROPOFOL;  Surgeon: Manus Gunning, MD;  Location: [facility];  Service: Gastroenterology;  Laterality: N/A;  . COLONOSCOPY WITH PROPOFOL  N/A 08/04/2018   Procedure: COLONOSCOPY WITH PROPOFOL;  Surgeon: Yetta Flock, MD;  Location: [facility];  Service: Gastroenterology;  Laterality: N/A;  . ERD  08/08/2000  . ESOPHAGEAL MANOMETRY N/A 03/13/2015   Procedure: ESOPHAGEAL MANOMETRY (EM);  Surgeon: Manus Gunning, MD;  Location: [facility];  Service: Gastroenterology;  Laterality: N/A;  . ESOPHAGOGASTRODUODENOSCOPY  06/25/2006  . ESOPHAGOGASTRODUODENOSCOPY (EGD) WITH PROPOFOL N/A 07/05/2015   Procedure: ESOPHAGOGASTRODUODENOSCOPY (EGD) WITH PROPOFOL;  Surgeon: Manus Gunning, MD;  Location: [facility];  Service: Gastroenterology;  Laterality: N/A;  . FISTULA SUPERFICIALIZATION Right 11/16/2018   Procedure: Fistula Superficialization;  Surgeon: Angelia Mould, MD;  Location: [place];  Service: Vascular;  Laterality: Right;  . HERNIA REPAIR    . IR PERC TUN PERIT CATH WO PORT S&I [facility]  02/17/2020  . IR US GUIDE VASC ACCESS RIGHT  02/18/2020  . LEFT AND RIGHT HEART CATHETERIZATION WITH CORONARY ANGIOGRAM N/A 03/03/2013   Procedure: LEFT AND RIGHT HEART CATHETERIZATION WITH CORONARY ANGIOGRAM;  Surgeon: Minus Breeding, MD;  Location: [facility] CATH LAB;  Service: Cardiovascular;  Laterality: N/A;  . REPLACEMENT TOTAL KNEE Left 1998  . UPPER GASTROINTESTINAL ENDOSCOPY       Allergies:   Allergies  Allergen Reactions  . Morphine And Related Other (See Comments)    Family request not to be given, reports pt does not wake up when given   . Promethazine Hcl Other (See Comments)    REACTION: lethargy     Social History:  reports that she quit smoking about 40 years ago. She has a 5.00 pack-year smoking history. She has never used smokeless tobacco. She reports that she does not drink alcohol and does not use drugs.   Family History: Family History  Problem Relation Age of Onset  . Liver cancer Mother        Liver Cancer  . Diabetes Mother   . Kidney disease Mother   . Heart disease Mother        age  7's  . Heart disease Father 51       MI  . Heart attack Father        died of MI when pt was 48  . Sickle cell anemia Father   . Colon cancer Brother   . Cancer Brother        Colon Cancer  . Diabetes Sister   . Kidney disease Sister   . Heart disease Sister        age 66's  . Allergies Sister   . Diabetes Sister   . Kidney disease Sister   . Heart disease Sister        age 34's  . Esophageal cancer Neg Hx   . Rectal cancer Neg Hx   .  Stomach cancer Neg Hx   . Amblyopia Neg Hx   . Blindness Neg Hx   . Glaucoma Neg Hx   . Macular degeneration Neg Hx   . Retinal detachment Neg Hx   . Cataracts Neg Hx   . Strabismus Neg Hx   . Retinitis pigmentosa Neg Hx      Physical Exam: Vitals:   04/10/20 1945 04/10/20 2050 04/10/20 2115 04/10/20 2235  BP: (!) 118/59 (!) 110/49 122/65 91/77  Pulse: 72 66  71  Resp: 15 18 12 15   Temp:      TempSrc:      SpO2: 100% 97%  97%    Constitutional: Awake, confused but cooperative not in any acute distress. Eyes: PERLA, EOMI, irises appear normal, anicteric sclera,  ENMT: external ears and nose appear normal,            Lips appears normal, oropharynx mucosa, tongue, posterior pharynx appear normal  Neck: neck appears normal, no masses, normal ROM, no thyromegaly, no JVD  CVS: S1-S2 clear, no murmur rubs or gallops, no LE edema, normal pedal pulses  Respiratory:  clear to auscultation bilaterally, no wheezing, rales or rhonchi. Respiratory effort normal. No accessory muscle use.  Abdomen: soft nontender, nondistended, normal bowel sounds, no hepatosplenomegaly, no hernias  Musculoskeletal: : no cyanosis, clubbing or edema noted bilaterally Neuro: Cranial nerves II-XII intact, strength, sensation, reflexes Psych: Pleasantly confused, no agitation or behavioral issues. Skin: no rashes or lesions or ulcers, no induration or nodules    Data reviewed:  I have personally reviewed following labs and imaging studies Labs:  CBC: Recent Labs   Lab 04/04/20 0847 04/05/20 0201 04/06/20 1020 04/08/20 1352 04/10/20 1514  WBC 5.1 6.4 4.9 11.3* 16.3*  NEUTROABS  --  1.5*  --   --  10.6*  HGB 7.8* 8.2* 8.4* 8.7* 10.0*  HCT 25.9* 27.5* 27.3* 29.3* 35.0*  MCV 78.0* 78.1* 78.7* 79.2* 83.5  PLT 123* 115* 135* 160 149*    Basic Metabolic Panel: Recent Labs  Lab 04/04/20 0847 04/05/20 0201 04/06/20 1037 04/08/20 1352 04/10/20 1514  NA 140 137 140 136 143  K 3.2* 3.3* 3.5 4.0 4.5  CL 101 101 103 97* 104  CO2 28 25 26 26 23   GLUCOSE 102* 125* 158* 170* 113*  BUN 25* 12 23 28* 24*  CREATININE 8.31* 5.65* 8.38* 8.39* 7.81*  CALCIUM 8.5* 8.4* 8.2* 9.1 9.4  MG  --  2.1  --   --   --   PHOS 5.6* 4.1 5.6* 5.9*  --    GFR Estimated Creatinine Clearance: 7.1 mL/min (A) (by C-G formula based on SCr of 7.81 mg/dL (H)). Liver Function Tests: Recent Labs  Lab 04/04/20 0847 04/05/20 0201 04/06/20 1037 04/08/20 1352 04/10/20 1514  AST  --  17  --   --  17  ALT  --  8  --   --  8  ALKPHOS  --  55  --   --  69  BILITOT  --  0.9  --   --  0.8  PROT  --  7.0  --   --  8.5*  ALBUMIN 2.6* 2.7* 2.8* 3.0* 3.1*   No results for input(s): LIPASE, AMYLASE in the last 168 hours. Recent Labs  Lab 04/10/20 1514  AMMONIA 20   Coagulation profile No results for input(s): INR, PROTIME in the last 168 hours.  Cardiac Enzymes: No results for input(s): CKTOTAL, CKMB, CKMBINDEX, TROPONINI in the last 168 hours. BNP:  Invalid input(s): POCBNP CBG: Recent Labs  Lab 04/07/20 2110 04/08/20 0801 04/08/20 1243 04/08/20 1717 04/10/20 1716  GLUCAP 118* 139* 191* 100* 107*   D-Dimer No results for input(s): DDIMER in the last 72 hours. Hgb A1c No results for input(s): HGBA1C in the last 72 hours. Lipid Profile No results for input(s): CHOL, HDL, LDLCALC, TRIG, CHOLHDL, LDLDIRECT in the last 72 hours. Thyroid function studies No results for input(s): TSH, T4TOTAL, T3FREE, THYROIDAB in the last 72 hours.  Invalid input(s):  FREET3 Anemia work up No results for input(s): VITAMINB12, FOLATE, FERRITIN, TIBC, IRON, RETICCTPCT in the last 72 hours. Urinalysis    Component Value Date/Time   COLORURINE AMBER (A) 03/09/2020 0645   APPEARANCEUR CLOUDY (A) 03/09/2020 0645   LABSPEC 1.020 03/09/2020 0645   PHURINE 5.0 03/09/2020 0645   GLUCOSEU NEGATIVE 03/09/2020 0645   GLUCOSEU >=1000 01/02/2009 1101   HGBUR NEGATIVE 03/09/2020 0645   HGBUR negative 03/25/2008 1022   BILIRUBINUR NEGATIVE 03/09/2020 0645   KETONESUR NEGATIVE 03/09/2020 0645   PROTEINUR 100 (A) 03/09/2020 0645   UROBILINOGEN 0.2 09/14/2013 1632   NITRITE NEGATIVE 03/09/2020 0645   LEUKOCYTESUR TRACE (A) 03/09/2020 0645     Sepsis Labs Invalid input(s): PROCALCITONIN,  WBC,  LACTICIDVEN Microbiology Recent Results (from the past 240 hour(s))  SARS CORONAVIRUS 2 (TAT 6-24 HRS) Nasopharyngeal Nasopharyngeal Swab     Status: None   Collection Time: 04/06/20  9:36 PM   Specimen: Nasopharyngeal Swab  Result Value Ref Range Status   SARS Coronavirus 2 NEGATIVE NEGATIVE Final    Comment: (NOTE) SARS-CoV-2 target nucleic acids are NOT DETECTED.  The SARS-CoV-2 RNA is generally detectable in upper and lower respiratory specimens during the acute phase of infection. Negative results do not preclude SARS-CoV-2 infection, do not rule out co-infections with other pathogens, and should not be used as the sole basis for treatment or other patient management decisions. Negative results must be combined with clinical observations, patient history, and epidemiological information. The expected result is Negative.  Fact Sheet for Patients: SugarRoll.be  Fact Sheet for Healthcare Providers: https://www.woods-mathews.com/  This test is not yet approved or cleared by the [place] FDA and  has been authorized for detection and/or diagnosis of SARS-CoV-2 by FDA under an Emergency Use Authorization (EUA). This  EUA will remain  in effect (meaning this test can be used) for the duration of the COVID-19 declaration under Se ction 564(b)(1) of the Act, 21 U.S.C. section 360bbb-3(b)(1), unless the authorization is terminated or revoked sooner.  Performed at [facility], [place] [address] [place], [place] [address]        Inpatient Medications:   Scheduled Meds: . [START ON 04/11/2020] aspirin EC  81 mg Oral Daily  . [START ON 04/11/2020] calcium carbonate  1 tablet Oral Daily  . [START ON 04/11/2020] clopidogrel  75 mg Oral Daily  . donepezil  5 mg Oral QHS  . [START ON 04/11/2020] ezetimibe  10 mg Oral Daily  . [START ON 04/11/2020] ferrous sulfate  325 mg Oral Daily  . [START ON 04/11/2020] Fluticasone-Umeclidin-Vilant  1 puff Oral Daily  . [START ON 04/11/2020] montelukast  10 mg Oral Daily  . OLANZapine zydis  5 mg Oral QHS  . [START ON 04/11/2020] Vitamin D3  1,000 Units Oral Daily   Continuous Infusions:   Radiological Exams on Admission: CT HEAD WO CONTRAST  Result Date: 04/10/2020 CLINICAL DATA:  Delirium, altered mental status. EXAM: CT HEAD WITHOUT CONTRAST TECHNIQUE: Contiguous axial images  were obtained from the base of the skull through the vertex without intravenous contrast. COMPARISON:  CT head 03/30/2020 FINDINGS: Brain: Cerebral ventricle sizes are concordant with the degree of cerebral volume loss. Patchy and confluent areas of decreased attenuation are noted throughout the deep and periventricular white matter of the cerebral hemispheres bilaterally, compatible with chronic microvascular ischemic disease. Chronic left cerebellar infarction. No evidence of large-territorial acute infarction. No parenchymal hemorrhage. No mass lesion. No extra-axial collection. No mass effect or midline shift. No hydrocephalus. Basilar cisterns are patent. Vascular: No hyperdense vessel. Atherosclerotic calcifications are present within the cavernous internal carotid arteries. Skull: No acute  fracture or focal lesion. Sinuses/Orbits: Mucosal thickening of the left sphenoid sinus. Otherwise visualized paranasal sinuses and mastoid air cells are clear. The orbits are unremarkable. Other: None. IMPRESSION: No acute intracranial abnormality. Electronically Signed   By: Iven Finn M.D.   On: 04/10/2020 15:43   MR BRAIN WO CONTRAST  Result Date: 04/10/2020 CLINICAL DATA:  Delirium EXAM: MRI HEAD WITHOUT CONTRAST TECHNIQUE: Multiplanar, multiecho pulse sequences of the brain and surrounding structures were obtained without intravenous contrast. COMPARISON:  Brain MRI 03/31/2020 FINDINGS: Brain: Unchanged multifocal bilateral abnormal diffusion restriction. Unchanged 2-3 foci of chronic microhemorrhage. No acute hemorrhage. Hyperintense T2-weighted signal is moderately widespread throughout the white matter. Generalized volume loss without a clear lobar predilection. The midline structures are normal. There is an old left cerebellar infarct. Vascular: Major flow voids are preserved. Skull and upper cervical spine: Normal calvarium and skull base. Visualized upper cervical spine and soft tissues are normal. Sinuses/Orbits:No paranasal sinus fluid levels or advanced mucosal thickening. No mastoid or middle ear effusion. Normal orbits. IMPRESSION: 1. Unchanged multifocal bilateral subacute watershed infarcts. No hemorrhage or mass effect. 2. Unchanged findings of chronic microvascular ischemia and volume loss. Electronically Signed   By: Ulyses Jarred M.D.   On: 04/10/2020 22:59   DG Chest Portable 1 View  Result Date: 04/10/2020 CLINICAL DATA:  Weakness. EXAM: PORTABLE CHEST 1 VIEW COMPARISON:  03/29/2020. FINDINGS: 1427 hours. Stable asymmetric elevation right hemidiaphragm. The cardio pericardial silhouette is enlarged. The lungs are clear without focal pneumonia, edema, pneumothorax or pleural effusion. Right IJ central line tip overlies the distal SVC level near the RA junction. The visualized  bony structures of the thorax show no acute abnormality. Telemetry leads overlie the chest. IMPRESSION: No acute cardiopulmonary findings. Electronically Signed   By: Misty Stanley M.D.   On: 04/10/2020 15:01    Impression/Recommendations Principal Problem:   Adult failure to thrive Active Problems:   Type 2 diabetes mellitus with hyperlipidemia (HCC)   ESRD on hemodialysis ([place])   Dementia with behavioral disturbance ([place])   Acute bilat watershed infarction [facility])   Delirium  1. Advanced dementia with delirium - 1. Mental status on my evaluation today is similar to what is described in discharge summary as well as Nephrology note from day of discharge. 2. Apparently despite starting zyprexa she will still become intermittently agitated as she did early AM of 3/19. 3. Currently pt is pleasantly confused-no agitation or behavioral issues.   Thank you for this consultation.  Our [facility] hospitalist team will follow the patient with you.   Time Spent: 80 min  GARDNER, JARED M. D.O. [facility] 04/10/2020, 11:43 PM

## 2020-04-10 NOTE — ED Provider Notes (Signed)
[facility] EMERGENCY DEPARTMENT Provider Note   CSN: [id number] Arrival date & time: 04/10/20  1258     History No chief complaint on file.   Joann Gutierrez is a 75 y.o. female pertinent past medical history of OSA on CPAP, ESRD on dialysis, dementia, chronic ambulatory dysfunction with bedbound status that presents to the emergency department today for confusion.  Patient was recently admitted to the hospital for ongoing failure to thrive since January 22, presented with altered mental status and she was found to have CVA which showed multifocal infarcts left greater than right.  No large vessel occlusion.  Patient presents back today after being discharged home.  Patient was supposed to go to SNF, however was denied due to insurance.  Patient is a 5 caveat due to altered mental status.  Spoke to son, he states that he she stated that she wanted to come back to the emergency department this morning that is why she is here.  Son states that she was acting completely normal yesterday afternoon when he last saw her.  He states to call her friend [name] who comes to check on on her.  [name] did not answer.  Was able to reach patient's daughter, she states that since she got home she has not been eating, states that she was acting slightly better than normal yesterday around 1230, was speaking, however then started not speaking and not eating again in the afternoon which lasted up until this morning.  She states that she is basically alone at home and is unable to take care of her, states that nursing has not been helping her.     HPI     Past Medical History:  Diagnosis Date  . Adenomatous colon polyp   . Allergy   . Anemia   . Asthma       . CAD (coronary artery disease)    Mild very minimal coronary disease with 20% obtuse marginal stenosis  . Carpal tunnel syndrome on left   . Cataract   . CHF (congestive heart failure) ([place])   . Chronic kidney disease (CKD),  stage III (moderate) (HCC)    now stage 4  . COPD (chronic obstructive pulmonary disease) ([place])   . Dementia with behavioral disturbance ([place]) 03/09/2020  . Depression   . 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 Diverticulosis   . Dyspnea   . 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 Esophageal dysmotility   . Esophageal stricture   . ESRD on hemodialysis ([place]) 03/09/2020  . Gastritis   . Gastroparesis 08/21/2007  . GERD (gastroesophageal reflux disease)   . Hearing loss of both ears   . Hernia, hiatal   . Hyperlipidemia   . Hypertension   . Morbid obesity ([place])   . OSTEOARTHRITIS 08/09/2006  . Osteoporosis   . Oxygen deficiency    prn   . 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 RENAL INSUFFICIENCY 02/16/2009  . 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 Seizures ([place])    pt thinks it has been several monthes since she had a seisure  . Shingles   . Sickle cell trait ([place])   . Sleep apnea    uses cpap  . Stroke [facility])     Patient Active Problem List   Diagnosis Date Noted  . Acute bilat watershed infarction [facility]) 03/31/2020  . AMS (altered mental status) 03/29/2020  . Acute metabolic encephalopathy 41/66/0630  . ESRD on hemodialysis ([place])  03/09/2020  . Thrombocytopenia ([place]) 03/09/2020  . Dementia with behavioral disturbance ([place]) 03/09/2020  . COVID-19   . SOB (shortness of breath)   . Type 2 diabetes mellitus with hyperlipidemia ([place])   . Sepsis ([place]) 02/16/2020  . Acute on chronic respiratory failure with hypoxia ([place]) 02/16/2020  . Edema of left lower leg 02/16/2020  . Pneumonia due to COVID-19 virus 02/15/2020  . Acute on chronic diastolic HF (heart failure) ([place]) 11/18/2019  . Dyslipidemia 11/18/2019  . Polypharmacy 04/13/2019  . Healthcare maintenance 10/15/2017  . ARF (acute renal failure) ([place]) 09/07/2017  . Chest pain 09/05/2017  . Ineffective health maintenance 06/26/2017  . Medication management 06/26/2017  . Retinal edema 10/07/2016  .  Postherpetic neuralgia 12/21/2015  . Herpes zoster with complication 00/93/8182  . Cellulitis of face 11/27/2015  . Anemia of chronic disease 08/17/2015  . History of colonic polyps   . Hypertensive emergency 05/20/2015  . Hypokalemia 05/20/2015  . AKI (acute kidney injury) ([place])   . Esophageal dysmotility 03/01/2015  . Throat congestion 10/11/2014  . Morbid obesity due to excess calories ([place]) 09/23/2014  . Acute asthmatic bronchitis 05/20/2014  . Abdominal pain, other specified site 09/13/2013  . Elevated liver enzymes 09/13/2013  . Cough 05/05/2013  . Pain in lower limb 03/05/2013  . DOE (dyspnea on exertion) 02/03/2013  . Chronic respiratory failure ([place]) 12/13/2012  . Stricture and stenosis of esophagus 08/31/2012  . Onychomycosis 06/01/2012  . Pain in joint, ankle and foot 06/01/2012  . Diabetes mellitus type 2 in obese ([place]) 04/29/2012  . Essential hypertension 07/19/2011  . CKD (chronic kidney disease), stage III ([place]) 05/19/2011  . Family history of malignant neoplasm of gastrointestinal tract 10/30/2010  . Bloating 10/16/2010  . Epigastric pain 10/16/2010  . Foot pain 09/25/2010  . Dysphagia 07/16/2010  . GERD (gastroesophageal reflux disease) 07/16/2010  . Microcytic anemia 07/16/2010  . Obstructive sleep apnea 06/16/2009  . HYPERCHOLESTEROLEMIA 02/16/2009  . 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 Gastroparesis 08/21/2007  . Chronic obstructive asthma ([place]) 08/09/2006    Past Surgical History:  Procedure Laterality Date  . ABDOMINAL HYSTERECTOMY    . ARTERY BIOPSY  01/07/2011   Procedure: MINOR BIOPSY TEMPORAL ARTERY;  Surgeon: Haywood Lasso, MD;  Location: [facility];  Service: General;  Laterality: Left;  left temporal artery biopsy  . AV FISTULA PLACEMENT Right 11/16/2018   Procedure: CREATION RIGHT BRACHIOCEPHALIC FISTULA  ARTERIOVENOUS FISTULA;  Surgeon: Angelia Mould, MD;  Location: [facility];  Service: Vascular;  Laterality:  Right;  . bil foot surgery    . BREAST LUMPECTOMY     benign  . BREAST LUMPECTOMY     both breast lumps removed   . CATARACT EXTRACTION Left 06/2016   Dr. Read Drivers  . COLONOSCOPY    . COLONOSCOPY WITH PROPOFOL N/A 07/05/2015   Procedure: COLONOSCOPY WITH PROPOFOL;  Surgeon: Manus Gunning, MD;  Location: [facility];  Service: Gastroenterology;  Laterality: N/A;  . COLONOSCOPY WITH PROPOFOL N/A 08/04/2018   Procedure: COLONOSCOPY WITH PROPOFOL;  Surgeon: Yetta Flock, MD;  Location: [facility];  Service: Gastroenterology;  Laterality: N/A;  . ERD  08/08/2000  . ESOPHAGEAL MANOMETRY N/A 03/13/2015   Procedure: ESOPHAGEAL MANOMETRY (EM);  Surgeon: Manus Gunning, MD;  Location: [facility];  Service: Gastroenterology;  Laterality: N/A;  . ESOPHAGOGASTRODUODENOSCOPY  06/25/2006  . ESOPHAGOGASTRODUODENOSCOPY (EGD) WITH PROPOFOL N/A 07/05/2015   Procedure: ESOPHAGOGASTRODUODENOSCOPY (EGD) WITH PROPOFOL;  Surgeon: Manus Gunning, MD;  [facility];  Service: Gastroenterology;  Laterality: N/A;  . FISTULA SUPERFICIALIZATION Right 11/16/2018   Procedure: Fistula Superficialization;  Surgeon: Angelia Mould, MD;  Location: [facility];  Service: Vascular;  Laterality: Right;  . HERNIA REPAIR    . IR PERC TUN PERIT CATH WO PORT S&I [facility]  02/17/2020  . IR US GUIDE VASC ACCESS RIGHT  02/18/2020  . LEFT AND RIGHT HEART CATHETERIZATION WITH CORONARY ANGIOGRAM N/A 03/03/2013   Procedure: LEFT AND RIGHT HEART CATHETERIZATION WITH CORONARY ANGIOGRAM;  Surgeon: Minus Breeding, MD;  Location: [facility] C[facility];  Service: Cardiovascular;  Laterality: N/A;  . REPLACEMENT TOTAL KNEE Left 1998  . UPPER GASTROINTESTINAL ENDOSCOPY       OB History   No obstetric history on file.     Family History  Problem Relation Age of Onset  . Liver cancer Mother        Liver Cancer  . Diabetes Mother   . Kidney disease Mother   . Heart disease Mother        age 40's  . Heart  disease Father 52       MI  . Heart attack Father        died of MI when pt was 27  . Sickle cell anemia Father   . Colon cancer Brother   . Cancer Brother        Colon Cancer  . Diabetes Sister   . Kidney disease Sister   . Heart disease Sister        age 75's  . Allergies Sister   . Diabetes Sister   . Kidney disease Sister   . Heart disease Sister        age 65's  . Esophageal cancer Neg Hx   . Rectal cancer Neg Hx   . Stomach cancer Neg Hx   . Amblyopia Neg Hx   . Blindness Neg Hx   . Glaucoma Neg Hx   . Macular degeneration Neg Hx   . Retinal detachment Neg Hx   . Cataracts Neg Hx   . Strabismus Neg Hx   . Retinitis pigmentosa Neg Hx     Social History   Tobacco Use  . Smoking status: Former Smoker    Packs/day: 0.50    Years: 10.00    Pack years: 5.00    Quit date: 04/18/1980    Years since quitting: 40.0  . Smokeless tobacco: Never Used  Vaping Use  . Vaping Use: Never used  Substance Use Topics  . Alcohol use: No    Alcohol/week: 0.0 standard drinks  . Drug use: No    Home Medications Prior to Admission medications   Medication Sig Start Date End Date Taking? Authorizing Provider  acetaminophen (TYLENOL) 325 MG tablet Take 650 mg by mouth every 4 (four) hours as needed for moderate pain.    [provider]  albuterol (VENTOLIN HFA) 108 (90 Base) MCG/ACT inhaler Inhale 2 puffs into the lungs every 4 (four) hours as needed for wheezing or shortness of breath. 04/08/20   Ghimire, Henreitta Leber, MD  ascorbic acid (VITAMIN C) 500 MG tablet Take 1 tablet (500 mg total) by mouth daily. 04/08/20   Ghimire, Henreitta Leber, MD  aspirin EC 81 MG tablet Take 1 tablet (81 mg total) by mouth daily. For 2 weeks-along with Plavix-after 2 weeks-stop aspirin. 04/08/20   Ghimire, Henreitta Leber, MD  azelastine (ASTELIN) 0.1 % nasal spray Place 1 spray into both nostrils 2 (two) times daily as needed (allergies).  Use in each nostril as directed 04/08/20   Ghimire, Henreitta Leber, MD   bisacodyl (DULCOLAX) 5 MG EC tablet Take 1 tablet (5 mg total) by mouth daily as needed for moderate constipation. 04/08/20   Ghimire, Henreitta Leber, MD  calcium carbonate (OSCAL) 1500 (600 Ca) MG TABS tablet Take 1 tablet (1,500 mg total) by mouth daily. 04/08/20   Ghimire, Henreitta Leber, MD  clopidogrel (PLAVIX) 75 MG tablet Take 1 tablet (75 mg total) by mouth daily. 04/08/20   Ghimire, Henreitta Leber, MD  donepezil (ARICEPT) 5 MG tablet Take 1 tablet (5 mg total) by mouth at bedtime. 04/08/20   Ghimire, Henreitta Leber, MD  ezetimibe (ZETIA) 10 MG tablet Take 1 tablet (10 mg total) by mouth daily. 04/08/20   Ghimire, Henreitta Leber, MD  ferrous sulfate 325 (65 FE) MG tablet Take 1 tablet (325 mg total) by mouth daily. 04/08/20   Ghimire, Henreitta Leber, MD  Fluticasone-Umeclidin-Vilant (TRELEGY ELLIPTA) 100-62.5-25 MCG/INH AEPB Take 1 puff by mouth daily. 04/08/20   Ghimire, Henreitta Leber, MD  guaiFENesin-dextromethorphan (ROBITUSSIN DM) 100-10 MG/5ML syrup Take 10 mLs by mouth every 4 (four) hours as needed for cough. 03/02/20   Sheikh, Omair Latif, DO  Ipratropium-Albuterol (COMBIVENT) 20-100 MCG/ACT AERS respimat Inhale 1 puff into the lungs every 6 (six) hours as needed for wheezing or shortness of breath. 04/08/20 05/08/20  Ghimire, Henreitta Leber, MD  lip balm (CARMEX) ointment Apply topically as needed for lip care. 03/02/20   Sheikh, Omair Latif, DO  montelukast (SINGULAIR) 10 MG tablet Take 1 tablet (10 mg total) by mouth daily. 04/08/20   Ghimire, Henreitta Leber, MD  nitroGLYCERIN (NITROSTAT) 0.4 MG SL tablet Place 1 tablet (0.4 mg total) under the tongue every 5 (five) minutes as needed for chest pain. 04/08/20   Ghimire, Henreitta Leber, MD  Nutritional Supplements (NEPRO) LIQD Take 237 mLs by mouth 2 (two) times daily. For caloric/protein 04/08/20 05/08/20  Ghimire, Henreitta Leber, MD  OLANZapine zydis (ZYPREXA) 5 MG disintegrating tablet Take 1 tablet (5 mg total) by mouth at bedtime. 04/07/20   Ghimire, Henreitta Leber, MD  omeprazole (PRILOSEC) 40 MG  capsule Take 1 capsule (40 mg total) by mouth in the morning and at bedtime. Please schedule a yearly follow up: [phone]. Thank you 04/08/20   Jonetta Osgood, MD  OXYGEN Inhale 2 L into the lungs as needed. CPAP with oxygen at bedtime    [provider]  polyethylene glycol (MIRALAX) 17 g packet Take 17 g by mouth daily as needed (constipation.). 04/08/20   Ghimire, Henreitta Leber, MD  Propylene Glycol (SYSTANE BALANCE) 0.6 % SOLN Place 1 drop into both eyes daily as needed (dry eyes).    [provider]  Vitamin D3 (VITAMIN D) 25 MCG tablet Take 1 tablet (1,000 Units total) by mouth daily. 04/08/20   Ghimire, Henreitta Leber, MD    Allergies    Morphine and related and Promethazine hcl  Review of Systems   Review of Systems  Unable to perform ROS: Mental status change    Physical Exam Updated Vital Signs BP (!) 121/52   Pulse 82   Temp 98.6 F (37 C) (Oral)   Resp 17   SpO2 95%   Physical Exam Constitutional:      General: She is not in acute distress.    Appearance: Normal appearance. She is not ill-appearing, toxic-appearing or diaphoretic.     Comments: Patient's eyes are open, moving upper extremities, will withdraw from pain in lower extremities.  Not speaking.   HENT:     Head: Normocephalic and atraumatic.  Eyes:     Extraocular Movements: Extraocular movements intact.  Cardiovascular:     Rate and Rhythm: Normal rate and regular rhythm.     Pulses: Normal pulses.  Pulmonary:     Effort: Pulmonary effort is normal.     Breath sounds: Normal breath sounds.     Comments: No respiratory distress. Abdominal:     General: Abdomen is flat. There is no distension.     Tenderness: There is no abdominal tenderness.  Musculoskeletal:        General: Normal range of motion.     Cervical back: Normal range of motion. No rigidity.  Skin:    General: Skin is warm and dry.     Capillary Refill: Capillary refill takes less than 2 seconds.  Neurological:     Mental  Status: She is alert.     Comments: Moving upper extremities bilaterally, lower extremities withdraw from pain.  Will not follow commands.  Is not speaking.  No facial droop.  Psychiatric:        Mood and Affect: Mood normal.        Behavior: Behavior normal.        Thought Content: Thought content normal.     ED Results / Procedures / Treatments   Labs (all labs ordered are listed, but only abnormal results are displayed) Labs Reviewed  URINE CULTURE  COMPREHENSIVE METABOLIC PANEL  CBC WITH DIFFERENTIAL/PLATELET  URINALYSIS, COMPLETE (UACMP) WITH MICROSCOPIC  AMMONIA  CBG MONITORING, ED  TROPONIN I (HIGH SENSITIVITY)    EKG EKG Interpretation  Date/Time:  Monday April 10 2020 13:27:30 EDT Ventricular Rate:  81 PR Interval:    QRS Duration: 93 QT Interval:  399 QTC Calculation: 464 R Axis:   14 Text Interpretation: Sinus rhythm Ventricular premature complex Aberrant conduction of SV complex(es) Probable left atrial enlargement ST elevation, consider inferior injury Artifact Confirmed by Gareth Morgan [id number]) on 04/10/2020 2:25:39 PM   Radiology CT HEAD WO CONTRAST  Result Date: 04/10/2020 CLINICAL DATA:  Delirium, altered mental status. EXAM: CT HEAD WITHOUT CONTRAST TECHNIQUE: Contiguous axial images were obtained from the base of the skull through the vertex without intravenous contrast. COMPARISON:  CT head 03/30/2020 FINDINGS: Brain: Cerebral ventricle sizes are concordant with the degree of cerebral volume loss. Patchy and confluent areas of decreased attenuation are noted throughout the deep and periventricular white matter of the cerebral hemispheres bilaterally, compatible with chronic microvascular ischemic disease. Chronic left cerebellar infarction. No evidence of large-territorial acute infarction. No parenchymal hemorrhage. No mass lesion. No extra-axial collection. No mass effect or midline shift. No hydrocephalus. Basilar cisterns are patent. Vascular: No  hyperdense vessel. Atherosclerotic calcifications are present within the cavernous internal carotid arteries. Skull: No acute fracture or focal lesion. Sinuses/Orbits: Mucosal thickening of the left sphenoid sinus. Otherwise visualized paranasal sinuses and mastoid air cells are clear. The orbits are unremarkable. Other: None. IMPRESSION: No acute intracranial abnormality. Electronically Signed   By: Iven Finn M.D.   On: 04/10/2020 15:43   DG Chest Portable 1 View  Result Date: 04/10/2020 CLINICAL DATA:  Weakness. EXAM: PORTABLE CHEST 1 VIEW COMPARISON:  03/29/2020. FINDINGS: 1427 hours. Stable asymmetric elevation right hemidiaphragm. The cardio pericardial silhouette is enlarged. The lungs are clear without focal pneumonia, edema, pneumothorax or pleural effusion. Right IJ central line tip overlies the distal SVC level near the RA junction. The visualized bony structures of the thorax show no  acute abnormality. Telemetry leads overlie the chest. IMPRESSION: No acute cardiopulmonary findings. Electronically Signed   By: Misty Stanley M.D.   On: 04/10/2020 15:01    Procedures Procedures   Medications Ordered in ED Medications - No data to display  ED Course  I have reviewed the triage vital signs and the nursing notes.  Pertinent labs & imaging results that were available during my care of the patient were reviewed by me and considered in my medical decision making (see chart for details).    MDM Rules/Calculators/A&P                          Joann Gutierrez is a 75 y.o. female pertinent past medical history of OSA on CPAP, ESRD on dialysis, dementia, chronic ambulatory dysfunction with bedbound status that presents to the emergency department today for confusion.  Conflicting stories from family members, however seems as if patient had been not acting normal after discharge and not eating, started acting normally in the afternoon yesterday and then started not acting normally again  sometime yesterday afternoon up until now.  No focal neuro deficits on exam, unsure what baseline is.  Will obtain altered mental status work-up including CT head.  Pt care was handed off to H. Khatki PA-C at 330.  Complete history and physical and current plan have been communicated.  Please refer to their note for the remainder of ED care and ultimate disposition.  This chart was discussed with Dr. Billy Fischer  who agreed with the care and disposition of the patient.   Final Clinical Impression(s) / ED Diagnoses Final diagnoses:  Delirium    Rx / DC Orders ED Discharge Orders    None       Alfredia Client, PA-C 04/10/20 1549    Gareth Morgan, MD 04/11/20 [phone]

## 2020-04-10 NOTE — ED Notes (Signed)
Pt given a sandwich bag. Pt tolerating food well.

## 2020-04-10 NOTE — ED Triage Notes (Signed)
Pt from home, recently staying at [facility] but is not back home. Family reports two days of no appetite and confusion. T, Th, S dialysis patient, family reports not missing any treatments. Pt bedbound at baseline, normally conversational without confusion.

## 2020-04-10 NOTE — ED Notes (Signed)
Provider notified pt. BP 85/36.
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
## 2020-04-10 NOTE — ED Notes (Signed)
Patient transported to CT 

## 2020-04-10 NOTE — ED Notes (Signed)
Patient transported to MRI 

## 2020-04-10 NOTE — Consult Note (Signed)
Medical Consultation   Joann Gutierrez  MGQ:[id number]  DOB: 10-13-1945  DOA: 04/10/2020  PCP: Andree Moro, DO    Requesting physician: Delia Heady PA-C  Reason for consultation: AMS   History of Present Illness: Joann Gutierrez is an 75 y.o. 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  Pt recently admitted to our service on 3/9 for confusion, AMS, intermittent agitation as well as ongoing adult failure to thrive with ongoing decline in functional status since Jan this year.  Unfortunately work up during that admission demonstrated acute watershed infarcts.  Throughout the admission she continued to be bed bound status, have poor PO intake, and have intermittent confusion and agitation.  Agitation seemed to improve some with zyprexa though was still present intermittently as recently as the morning of discharge 3/19.  Her prognosis was felt to be poor and although she was made DNR on 3/15 by family, they later revoked this on 3/17.  It was felt that she was stable for discharge to SNF; however, her insurance company, in their infinite wisdom, unfortunately denied SNF.  Thus she had to be discharged home with home health instead.  Unfortunately, pt returns to ED today: home health has apparently fired pt/family (see SW note)  She continues to have ongoing confusion and poor appetite per family members.  Pt is awake, pleasantly confused, denies pain anywhere when asked.  Says she is hungry when asked.  Now eating a sandwich after my evaluation.  Review of Systems:  ROS As per HPI otherwise 10 point review of systems negative.     Past Medical History: Past Medical History:  Diagnosis Date  . Adenomatous colon polyp   . Allergy   . Anemia   . Asthma       . CAD (coronary artery disease)    Mild very minimal coronary disease with 20% obtuse marginal stenosis  . Carpal tunnel syndrome on left   . Cataract   . CHF (congestive heart failure) ([place])    . Chronic kidney disease (CKD), stage III (moderate) (HCC)    now stage 4  . COPD (chronic obstructive pulmonary disease) ([place])   . Dementia with behavioral disturbance ([place]) 03/09/2020  . Depression   . 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 Diverticulosis   . Dyspnea   . 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 Esophageal dysmotility   . Esophageal stricture   . ESRD on hemodialysis ([place]) 03/09/2020  . Gastritis   . Gastroparesis 08/21/2007  . GERD (gastroesophageal reflux disease)   . Hearing loss of both ears   . Hernia, hiatal   . Hyperlipidemia   . Hypertension   . Morbid obesity ([place])   . OSTEOARTHRITIS 08/09/2006  . Osteoporosis   . Oxygen deficiency    prn   . 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 RENAL INSUFFICIENCY 02/16/2009  . 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 Seizures ([place])    pt thinks it has been several monthes since she had a seisure  . Shingles   . Sickle cell trait ([place])   . Sleep apnea    uses cpap  . Stroke [facility])     Past Surgical History: Past Surgical History:  Procedure Laterality Date  . ABDOMINAL HYSTERECTOMY    . ARTERY BIOPSY  01/07/2011   Procedure: MINOR BIOPSY TEMPORAL ARTERY;  Surgeon: Haywood Lasso, MD;  Location:  [facility];  Service: General;  Laterality: Left;  left temporal artery biopsy  . AV FISTULA PLACEMENT Right 11/16/2018   Procedure: CREATION RIGHT BRACHIOCEPHALIC FISTULA  ARTERIOVENOUS FISTULA;  Surgeon: Angelia Mould, MD;  Location: [place] [facility];  Service: Vascular;  Laterality: Right;  . bil foot surgery    . BREAST LUMPECTOMY     benign  . BREAST LUMPECTOMY     both breast lumps removed   . CATARACT EXTRACTION Left 06/2016   Dr. Read Drivers  . COLONOSCOPY    . COLONOSCOPY WITH PROPOFOL N/A 07/05/2015   Procedure: COLONOSCOPY WITH PROPOFOL;  Surgeon: Manus Gunning, MD;  Location: [facility];  Service: Gastroenterology;  Laterality: N/A;  . COLONOSCOPY WITH PROPOFOL  N/A 08/04/2018   Procedure: COLONOSCOPY WITH PROPOFOL;  Surgeon: Yetta Flock, MD;  Location: [facility];  Service: Gastroenterology;  Laterality: N/A;  . ERD  08/08/2000  . ESOPHAGEAL MANOMETRY N/A 03/13/2015   Procedure: ESOPHAGEAL MANOMETRY (EM);  Surgeon: Manus Gunning, MD;  Location: [facility];  Service: Gastroenterology;  Laterality: N/A;  . ESOPHAGOGASTRODUODENOSCOPY  06/25/2006  . ESOPHAGOGASTRODUODENOSCOPY (EGD) WITH PROPOFOL N/A 07/05/2015   Procedure: ESOPHAGOGASTRODUODENOSCOPY (EGD) WITH PROPOFOL;  Surgeon: Manus Gunning, MD;  [facility]: [facility];  Service: Gastroenterology;  Laterality: N/A;  . FISTULA SUPERFICIALIZATION Right 11/16/2018   Procedure: Fistula Superficialization;  Surgeon: Angelia Mould, MD;  Location: [facility];  Service: Vascular;  Laterality: Right;  . HERNIA REPAIR    . IR PERC TUN PERIT CATH WO PORT S&I [facility]  02/17/2020  . IR US GUIDE VASC ACCESS RIGHT  02/18/2020  . LEFT AND RIGHT HEART CATHETERIZATION WITH CORONARY ANGIOGRAM N/A 03/03/2013   Procedure: LEFT AND RIGHT HEART CATHETERIZATION WITH CORONARY ANGIOGRAM;  Surgeon: Minus Breeding, MD;  Location: [facility];  Service: Cardiovascular;  Laterality: N/A;  . REPLACEMENT TOTAL KNEE Left 1998  . UPPER GASTROINTESTINAL ENDOSCOPY       Allergies:   Allergies  Allergen Reactions  . Morphine And Related Other (See Comments)    Family request not to be given, reports pt does not wake up when given   . Promethazine Hcl Other (See Comments)    REACTION: lethargy     Social History:  reports that she quit smoking about 40 years ago. She has a 5.00 pack-year smoking history. She has never used smokeless tobacco. She reports that she does not drink alcohol and does not use drugs.   Family History: Family History  Problem Relation Age of Onset  . Liver cancer Mother        Liver Cancer  . Diabetes Mother   . Kidney disease Mother   . Heart disease Mother        age  63's  . Heart disease Father 21       MI  . Heart attack Father        died of MI when pt was 75  . Sickle cell anemia Father   . Colon cancer Brother   . Cancer Brother        Colon Cancer  . Diabetes Sister   . Kidney disease Sister   . Heart disease Sister        age 64's  . Allergies Sister   . Diabetes Sister   . Kidney disease Sister   . Heart disease Sister        age 81's  . Esophageal cancer Neg Hx   . Rectal cancer Neg Hx   .  Stomach cancer Neg Hx   . Amblyopia Neg Hx   . Blindness Neg Hx   . Glaucoma Neg Hx   . Macular degeneration Neg Hx   . Retinal detachment Neg Hx   . Cataracts Neg Hx   . Strabismus Neg Hx   . Retinitis pigmentosa Neg Hx      Physical Exam: Vitals:   04/10/20 1945 04/10/20 2050 04/10/20 2115 04/10/20 2235  BP: (!) 118/59 (!) 110/49 122/65 91/77  Pulse: 72 66  71  Resp: 15 18 12 15   Temp:      TempSrc:      SpO2: 100% 97%  97%    Constitutional: Awake, confused but cooperative not in any acute distress. Eyes: PERLA, EOMI, irises appear normal, anicteric sclera,  ENMT: external ears and nose appear normal,            Lips appears normal, oropharynx mucosa, tongue, posterior pharynx appear normal  Neck: neck appears normal, no masses, normal ROM, no thyromegaly, no JVD  CVS: S1-S2 clear, no murmur rubs or gallops, no LE edema, normal pedal pulses  Respiratory:  clear to auscultation bilaterally, no wheezing, rales or rhonchi. Respiratory effort normal. No accessory muscle use.  Abdomen: soft nontender, nondistended, normal bowel sounds, no hepatosplenomegaly, no hernias  Musculoskeletal: : no cyanosis, clubbing or edema noted bilaterally Neuro: Cranial nerves II-XII intact, strength, sensation, reflexes Psych: Pleasantly confused, no agitation or behavioral issues. Skin: no rashes or lesions or ulcers, no induration or nodules    Data reviewed:  I have personally reviewed following labs and imaging studies Labs:  CBC: Recent Labs   Lab 04/04/20 0847 04/05/20 0201 04/06/20 1020 04/08/20 1352 04/10/20 1514  WBC 5.1 6.4 4.9 11.3* 16.3*  NEUTROABS  --  1.5*  --   --  10.6*  HGB 7.8* 8.2* 8.4* 8.7* 10.0*  HCT 25.9* 27.5* 27.3* 29.3* 35.0*  MCV 78.0* 78.1* 78.7* 79.2* 83.5  PLT 123* 115* 135* 160 149*    Basic Metabolic Panel: Recent Labs  Lab 04/04/20 0847 04/05/20 0201 04/06/20 1037 04/08/20 1352 04/10/20 1514  NA 140 137 140 136 143  K 3.2* 3.3* 3.5 4.0 4.5  CL 101 101 103 97* 104  CO2 28 25 26 26 23   GLUCOSE 102* 125* 158* 170* 113*  BUN 25* 12 23 28* 24*  CREATININE 8.31* 5.65* 8.38* 8.39* 7.81*  CALCIUM 8.5* 8.4* 8.2* 9.1 9.4  MG  --  2.1  --   --   --   PHOS 5.6* 4.1 5.6* 5.9*  --    GFR Estimated Creatinine Clearance: 7.1 mL/min (A) (by C-G formula based on SCr of 7.81 mg/dL (H)). Liver Function Tests: Recent Labs  Lab 04/04/20 0847 04/05/20 0201 04/06/20 1037 04/08/20 1352 04/10/20 1514  AST  --  17  --   --  17  ALT  --  8  --   --  8  ALKPHOS  --  55  --   --  69  BILITOT  --  0.9  --   --  0.8  PROT  --  7.0  --   --  8.5*  ALBUMIN 2.6* 2.7* 2.8* 3.0* 3.1*   No results for input(s): LIPASE, AMYLASE in the last 168 hours. Recent Labs  Lab 04/10/20 1514  AMMONIA 20   Coagulation profile No results for input(s): INR, PROTIME in the last 168 hours.  Cardiac Enzymes: No results for input(s): CKTOTAL, CKMB, CKMBINDEX, TROPONINI in the last 168 hours. BNP:  Invalid input(s): POCBNP CBG: Recent Labs  Lab 04/07/20 2110 04/08/20 0801 04/08/20 1243 04/08/20 1717 04/10/20 1716  GLUCAP 118* 139* 191* 100* 107*   D-Dimer No results for input(s): DDIMER in the last 72 hours. Hgb A1c No results for input(s): HGBA1C in the last 72 hours. Lipid Profile No results for input(s): CHOL, HDL, LDLCALC, TRIG, CHOLHDL, LDLDIRECT in the last 72 hours. Thyroid function studies No results for input(s): TSH, T4TOTAL, T3FREE, THYROIDAB in the last 72 hours.  Invalid input(s):  FREET3 Anemia work up No results for input(s): VITAMINB12, FOLATE, FERRITIN, TIBC, IRON, RETICCTPCT in the last 72 hours. Urinalysis    Component Value Date/Time   COLORURINE AMBER (A) 03/09/2020 0645   APPEARANCEUR CLOUDY (A) 03/09/2020 0645   LABSPEC 1.020 03/09/2020 0645   PHURINE 5.0 03/09/2020 0645   GLUCOSEU NEGATIVE 03/09/2020 0645   GLUCOSEU >=1000 01/02/2009 1101   HGBUR NEGATIVE 03/09/2020 0645   HGBUR negative 03/25/2008 1022   BILIRUBINUR NEGATIVE 03/09/2020 0645   KETONESUR NEGATIVE 03/09/2020 0645   PROTEINUR 100 (A) 03/09/2020 0645   UROBILINOGEN 0.2 09/14/2013 1632   NITRITE NEGATIVE 03/09/2020 0645   LEUKOCYTESUR TRACE (A) 03/09/2020 0645     Sepsis Labs Invalid input(s): PROCALCITONIN,  WBC,  LACTICIDVEN Microbiology Recent Results (from the past 240 hour(s))  SARS CORONAVIRUS 2 (TAT 6-24 HRS) Nasopharyngeal Nasopharyngeal Swab     Status: None   Collection Time: 04/06/20  9:36 PM   Specimen: Nasopharyngeal Swab  Result Value Ref Range Status   SARS Coronavirus 2 NEGATIVE NEGATIVE Final    Comment: (NOTE) SARS-CoV-2 target nucleic acids are NOT DETECTED.  The SARS-CoV-2 RNA is generally detectable in upper and lower respiratory specimens during the acute phase of infection. Negative results do not preclude SARS-CoV-2 infection, do not rule out co-infections with other pathogens, and should not be used as the sole basis for treatment or other patient management decisions. Negative results must be combined with clinical observations, patient history, and epidemiological information. The expected result is Negative.  Fact Sheet for Patients: SugarRoll.be  Fact Sheet for Healthcare Providers: https://www.woods-mathews.com/  This test is not yet approved or cleared by the [place] FDA and  has been authorized for detection and/or diagnosis of SARS-CoV-2 by FDA under an Emergency Use Authorization (EUA). This  EUA will remain  in effect (meaning this test can be used) for the duration of the COVID-19 declaration under Se ction 564(b)(1) of the Act, 21 U.S.C. section 360bbb-3(b)(1), unless the authorization is terminated or revoked sooner.  Performed at [facility], [place] [address] [place], [place] [address]        Inpatient Medications:   Scheduled Meds: . [START ON 04/11/2020] aspirin EC  81 mg Oral Daily  . [START ON 04/11/2020] calcium carbonate  1 tablet Oral Daily  . [START ON 04/11/2020] clopidogrel  75 mg Oral Daily  . donepezil  5 mg Oral QHS  . [START ON 04/11/2020] ezetimibe  10 mg Oral Daily  . [START ON 04/11/2020] ferrous sulfate  325 mg Oral Daily  . [START ON 04/11/2020] Fluticasone-Umeclidin-Vilant  1 puff Oral Daily  . [START ON 04/11/2020] montelukast  10 mg Oral Daily  . OLANZapine zydis  5 mg Oral QHS  . [START ON 04/11/2020] Vitamin D3  1,000 Units Oral Daily   Continuous Infusions:   Radiological Exams on Admission: CT HEAD WO CONTRAST  Result Date: 04/10/2020 CLINICAL DATA:  Delirium, altered mental status. EXAM: CT HEAD WITHOUT CONTRAST TECHNIQUE: Contiguous axial images  were obtained from the base of the skull through the vertex without intravenous contrast. COMPARISON:  CT head 03/30/2020 FINDINGS: Brain: Cerebral ventricle sizes are concordant with the degree of cerebral volume loss. Patchy and confluent areas of decreased attenuation are noted throughout the deep and periventricular white matter of the cerebral hemispheres bilaterally, compatible with chronic microvascular ischemic disease. Chronic left cerebellar infarction. No evidence of large-territorial acute infarction. No parenchymal hemorrhage. No mass lesion. No extra-axial collection. No mass effect or midline shift. No hydrocephalus. Basilar cisterns are patent. Vascular: No hyperdense vessel. Atherosclerotic calcifications are present within the cavernous internal carotid arteries. Skull: No acute  fracture or focal lesion. Sinuses/Orbits: Mucosal thickening of the left sphenoid sinus. Otherwise visualized paranasal sinuses and mastoid air cells are clear. The orbits are unremarkable. Other: None. IMPRESSION: No acute intracranial abnormality. Electronically Signed   By: Iven Finn M.D.   On: 04/10/2020 15:43   MR BRAIN WO CONTRAST  Result Date: 04/10/2020 CLINICAL DATA:  Delirium EXAM: MRI HEAD WITHOUT CONTRAST TECHNIQUE: Multiplanar, multiecho pulse sequences of the brain and surrounding structures were obtained without intravenous contrast. COMPARISON:  Brain MRI 03/31/2020 FINDINGS: Brain: Unchanged multifocal bilateral abnormal diffusion restriction. Unchanged 2-3 foci of chronic microhemorrhage. No acute hemorrhage. Hyperintense T2-weighted signal is moderately widespread throughout the white matter. Generalized volume loss without a clear lobar predilection. The midline structures are normal. There is an old left cerebellar infarct. Vascular: Major flow voids are preserved. Skull and upper cervical spine: Normal calvarium and skull base. Visualized upper cervical spine and soft tissues are normal. Sinuses/Orbits:No paranasal sinus fluid levels or advanced mucosal thickening. No mastoid or middle ear effusion. Normal orbits. IMPRESSION: 1. Unchanged multifocal bilateral subacute watershed infarcts. No hemorrhage or mass effect. 2. Unchanged findings of chronic microvascular ischemia and volume loss. Electronically Signed   By: Ulyses Jarred M.D.   On: 04/10/2020 22:59   DG Chest Portable 1 View  Result Date: 04/10/2020 CLINICAL DATA:  Weakness. EXAM: PORTABLE CHEST 1 VIEW COMPARISON:  03/29/2020. FINDINGS: 1427 hours. Stable asymmetric elevation right hemidiaphragm. The cardio pericardial silhouette is enlarged. The lungs are clear without focal pneumonia, edema, pneumothorax or pleural effusion. Right IJ central line tip overlies the distal SVC level near the RA junction. The visualized  bony structures of the thorax show no acute abnormality. Telemetry leads overlie the chest. IMPRESSION: No acute cardiopulmonary findings. Electronically Signed   By: Misty Stanley M.D.   On: 04/10/2020 15:01    Impression/Recommendations Principal Problem:   Adult failure to thrive Active Problems:   Type 2 diabetes mellitus with hyperlipidemia (HCC)   ESRD on hemodialysis ([place])   Dementia with behavioral disturbance ([place])   Acute bilat watershed infarction [facility])   Delirium  1. Advanced dementia with delirium - with adult failure to thrive. 1. Mental status on my evaluation today is similar to what is described in discharge summary as well as Nephrology note from day of discharge. 2. Apparently despite starting zyprexa she will still become intermittently agitated as she did early AM of 3/19. 3. Currently pt is pleasantly confused-no agitation or behavioral issues.  This seems to be her new baseline based on discharge summary from 2 days ago.  This is secondary to the watershed infarcts she was admitted for on 3/9. 4. Only acute finding in ED is WBC of 16k.  No other SIRS nor infectious source found at this time. 5. MRI today just shows the now subacute watershed infarcts seen on 3/9. 6. At this point,  dont see any acute indication for admission to hospital. 7. Due to social work issues, (and fact that she probably still needs SNF chronically), she will be kept in ED for the moment as ED hold. 8. Cont zyprexa and other home meds 2. ESRD - 1. Call nephro in AM for routine dialysis 3. DM2 - 1. Pt off of insulin at this point as of discharge   Thank you for this consultation.  Please call back if patient status changes.   Time Spent: 80 min  Timothea Bodenheimer M. D.O. [facility] 04/10/2020, 11:43 PM

## 2020-04-11 MED ORDER — SODIUM CHLORIDE 0.9 % IV BOLUS
500.0000 mL | Freq: Once | INTRAVENOUS | Status: AC
  Administered 2020-04-11: 500 mL via INTRAVENOUS

## 2020-04-11 MED ORDER — HEPARIN SODIUM (PORCINE) 1000 UNIT/ML IJ SOLN
INTRAMUSCULAR | Status: AC
  Filled 2020-04-11: qty 4

## 2020-04-11 NOTE — ED Notes (Signed)
This RN is taking over care for pt. Pt is confused and states she just wants to go home. RN spoke with pt about needing to find her care while she is not in the hospital. PT allowed RN to take BP only. Pt has no complaints of pain. Providing pt with diet sprite and warm blanket.

## 2020-04-11 NOTE — Discharge Planning (Signed)
Pt was active with [facility] for [place] services as confirmed by [facility] with [place] [facility].  [facility] will not accept pt back because of environmental activities in the home.  RNCM consulted alternate home health agencies.  Will update pt with results.

## 2020-04-11 NOTE — ED Notes (Signed)
RN Marcie Bal attempted to feed patient breakfast. Pt refusing to eat.

## 2020-04-11 NOTE — ED Notes (Signed)
Notified  Dr Roney Jaffe, MD urology of patient refusal for VS, telemetry monitoring. Care via chat. Attending physician offline.

## 2020-04-11 NOTE — Procedures (Signed)
   I was present at this dialysis session, have reviewed the session itself and made  appropriate changes Kelly Splinter MD [place] pager [phone]   04/11/2020, 4:00 PM

## 2020-04-11 NOTE — Progress Notes (Addendum)
CSW spoke via phone with Shara Blazing of [facility] APS @ [phone]. Mr. Carlota Raspberry states that at this time, it is the position of APS that patient is unsafe in the home due to lack of care. CSW informed Mr. Carlota Raspberry that Pt was declined for SNF on 3/19 and that family does not wish to pursue Medicaid eligibility for long-term placement options. Mr. Carlota Raspberry states that he will be speaking with other family members  On 3/23 in an effort to resolve case.  CSW will continue to follow.

## 2020-04-11 NOTE — ED Notes (Signed)
Continues to refuse care. States she does not need dialysis and will not allow VS.

## 2020-04-11 NOTE — ED Notes (Signed)
This RN attempted numerous times to give medications to pt. Pt became combative and asked not to be touched.

## 2020-04-11 NOTE — ED Notes (Signed)
EDP Dr. Roxanne Mins informed of pts BP of 89/41.

## 2020-04-11 NOTE — ED Notes (Signed)
Sister at bedside. Pt ate 50% dinner and allowing Rn to take vitals

## 2020-04-11 NOTE — ED Notes (Signed)
Patient brought to room 43 from dialysis. Confused  baseline per dialysis RN report. Patient awake, alert, oriented to person. Refused VS and to connect to telemetry monitoring at present.

## 2020-04-12 LAB — PATHOLOGIST SMEAR REVIEW

## 2020-04-12 NOTE — Progress Notes (Signed)
CSW received a call back from [place] who stated patients sister Joann Gutierrez stated she would apply for medicaid for patient. CSW informed APS that patient was refusing meds, not participating with PT, didn't want to participate in dialysis, and wasn't eating all her food. CSW also informed APS that the family would need to give up patients check for placement. [place] stated that sister drives a bus and he will speak to her and give CSW a call back.

## 2020-04-12 NOTE — [place] FL2 (Signed)
[place] MEDICAID FL2 LEVEL OF CARE SCREENING TOOL     IDENTIFICATION  Patient Name: Joann Gutierrez Birthdate: 06/21/1945 Sex: female Admission Date (Current Location): 04/10/2020  [facility] and [place] Number:  Herbalist and Address:  [facility] [place], [place] [address] [place], [place] [address]      Provider Number: [id number]  Attending Physician Name and Address:  Default, Provider, MD  Relative Name and Phone Number:  Marlaine Arey, [phone]    Current Level of Care: Hospital Recommended Level of Care: [place] Prior Approval Number:    Date Approved/Denied:   PASRR Number: [id number]  Discharge Plan: SNF    Current Diagnoses: Patient Active Problem List   Diagnosis Date Noted  . Delirium 04/10/2020  . Adult failure to thrive 04/10/2020  . Acute bilat watershed infarction [facility]) 03/31/2020  . AMS (altered mental status) 03/29/2020  . Acute metabolic encephalopathy 15/05/6977  . ESRD on hemodialysis ([place]) 03/09/2020  . Thrombocytopenia ([place]) 03/09/2020  . Dementia with behavioral disturbance ([place]) 03/09/2020  . COVID-19   . SOB (shortness of breath)   . Type 2 diabetes mellitus with hyperlipidemia ([place])   . Sepsis ([place]) 02/16/2020  . Acute on chronic respiratory failure with hypoxia ([place]) 02/16/2020  . Edema of left lower leg 02/16/2020  . Pneumonia due to COVID-19 virus 02/15/2020  . Acute on chronic diastolic HF (heart failure) ([place]) 11/18/2019  . Dyslipidemia 11/18/2019  . Polypharmacy 04/13/2019  . Healthcare maintenance 10/15/2017  . ARF (acute renal failure) ([place]) 09/07/2017  . Chest pain 09/05/2017  . Ineffective health maintenance 06/26/2017  . Medication management 06/26/2017  . Retinal edema 10/07/2016  . Postherpetic neuralgia 12/21/2015  . Herpes zoster with complication 48/01/6551  . Cellulitis of face 11/27/2015  . Anemia of chronic disease 08/17/2015  . History of colonic polyps    . Hypertensive emergency 05/20/2015  . Hypokalemia 05/20/2015  . AKI (acute kidney injury) ([place])   . Esophageal dysmotility 03/01/2015  . Throat congestion 10/11/2014  . Morbid obesity due to excess calories ([place]) 09/23/2014  . Acute asthmatic bronchitis 05/20/2014  . Abdominal pain, other specified site 09/13/2013  . Elevated liver enzymes 09/13/2013  . Cough 05/05/2013  . Pain in lower limb 03/05/2013  . DOE (dyspnea on exertion) 02/03/2013  . Chronic respiratory failure ([place]) 12/13/2012  . Stricture and stenosis of esophagus 08/31/2012  . Onychomycosis 06/01/2012  . Pain in joint, ankle and foot 06/01/2012  . Diabetes mellitus type 2 in obese ([place]) 04/29/2012  . Essential hypertension 07/19/2011  . CKD (chronic kidney disease), stage III ([place]) 05/19/2011  . Family history of malignant neoplasm of gastrointestinal tract 10/30/2010  . Bloating 10/16/2010  . Epigastric pain 10/16/2010  . Foot pain 09/25/2010  . Dysphagia 07/16/2010  . GERD (gastroesophageal reflux disease) 07/16/2010  . Microcytic anemia 07/16/2010  . Obstructive sleep apnea 06/16/2009  . HYPERCHOLESTEROLEMIA 02/16/2009  . 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 Gastroparesis 08/21/2007  . Chronic obstructive asthma ([place]) 08/09/2006    Orientation RESPIRATION BLADDER Height & Weight     Self,Place  Normal Incontinent Weight:  (unable to weigh pt on stretchter) Height:     BEHAVIORAL SYMPTOMS/MOOD NEUROLOGICAL BOWEL NUTRITION STATUS      Incontinent    AMBULATORY STATUS COMMUNICATION OF NEEDS Skin   Extensive Assist Verbally Normal                       Personal Care Assistance Level of  Assistance  Bathing,Feeding,Dressing Bathing Assistance: Maximum assistance Feeding assistance: Independent Dressing Assistance: Maximum assistance     Functional Limitations Info  Sight,Hearing,Speech Sight Info: Adequate Hearing Info: Impaired Speech Info: Adequate    SPECIAL CARE FACTORS FREQUENCY                        Contractures Contractures Info: Not present    Additional Factors Info  Code Status,Allergies Code Status Info: Full Allergies Info: Morphine and related, Promethazine HCl           Current Medications (04/12/2020):  This is the current hospital active medication list Current Facility-Administered Medications  Medication Dose Route Frequency Provider Last Rate Last Admin  . acetaminophen (TYLENOL) tablet 650 mg  650 mg Oral Q4H PRN Khatri, Hina, PA-C      . albuterol (VENTOLIN HFA) 108 (90 Base) MCG/ACT inhaler 2 puff  2 puff Inhalation Q4H PRN Khatri, Hina, PA-C      . aspirin EC tablet 81 mg  81 mg Oral Daily Khatri, Hina, PA-C   81 mg at 04/12/20 1222  . calcium carbonate (OS-CAL - dosed in mg of elemental calcium) tablet 500 mg of elemental calcium  1 tablet Oral Daily Khatri, Hina, PA-C   500 mg of elemental calcium at 04/12/20 1223  . cholecalciferol (VITAMIN D3) tablet 1,000 Units  1,000 Units Oral Daily Khatri, Hina, PA-C   1,000 Units at 04/12/20 1222  . clopidogrel (PLAVIX) tablet 75 mg  75 mg Oral Daily Khatri, Hina, PA-C   75 mg at 04/12/20 1223  . donepezil (ARICEPT) tablet 5 mg  5 mg Oral QHS Khatri, Hina, PA-C   5 mg at 04/11/20 2240  . ezetimibe (ZETIA) tablet 10 mg  10 mg Oral Daily Khatri, Hina, PA-C   10 mg at 04/12/20 1221  . ferrous sulfate tablet 325 mg  325 mg Oral Daily Khatri, Hina, PA-C   325 mg at 04/12/20 1223  . fluticasone furoate-vilanterol (BREO ELLIPTA) 100-25 MCG/INH 1 puff  1 puff Inhalation Daily Dykstra, Ellwood Dense, MD       And  . umeclidinium bromide (INCRUSE ELLIPTA) 62.5 MCG/INH 1 puff  1 puff Inhalation Daily Dykstra, Ellwood Dense, MD      . guaiFENesin-dextromethorphan (ROBITUSSIN DM) 100-10 MG/5ML syrup 10 mL  10 mL Oral Q4H PRN Khatri, Hina, PA-C      . Ipratropium-Albuterol (COMBIVENT) respimat 1 puff  1 puff Inhalation Q6H PRN Khatri, Hina, PA-C      . lip balm (CARMEX) ointment   Topical PRN Khatri, Hina, PA-C       . montelukast (SINGULAIR) tablet 10 mg  10 mg Oral Daily Khatri, Hina, PA-C   10 mg at 04/12/20 1222  . OLANZapine zydis (ZYPREXA) disintegrating tablet 5 mg  5 mg Oral QHS Khatri, Hina, PA-C   5 mg at 04/11/20 2240  . polyethylene glycol (MIRALAX / GLYCOLAX) packet 17 g  17 g Oral Daily PRN Khatri, Hina, PA-C       Current Outpatient Medications  Medication Sig Dispense Refill  . acetaminophen (TYLENOL) 325 MG tablet Take 650 mg by mouth every 4 (four) hours as needed for moderate pain.    Marland Kitchen albuterol (VENTOLIN HFA) 108 (90 Base) MCG/ACT inhaler Inhale 2 puffs into the lungs every 4 (four) hours as needed for wheezing or shortness of breath. 18 g 6  . ascorbic acid (VITAMIN C) 500 MG tablet Take 1 tablet (500 mg total) by mouth daily. 30 tablet 0  .  aspirin EC 81 MG tablet Take 1 tablet (81 mg total) by mouth daily. For 2 weeks-along with Plavix-after 2 weeks-stop aspirin. 30 tablet 11  . azelastine (ASTELIN) 0.1 % nasal spray Place 1 spray into both nostrils 2 (two) times daily as needed (allergies). Use in each nostril as directed 30 mL 0  . bisacodyl (DULCOLAX) 5 MG EC tablet Take 1 tablet (5 mg total) by mouth daily as needed for moderate constipation. 30 tablet 0  . calcium carbonate (OSCAL) 1500 (600 Ca) MG TABS tablet Take 1 tablet (1,500 mg total) by mouth daily. 30 tablet 0  . clopidogrel (PLAVIX) 75 MG tablet Take 1 tablet (75 mg total) by mouth daily. 30 tablet 0  . donepezil (ARICEPT) 5 MG tablet Take 1 tablet (5 mg total) by mouth at bedtime. 30 tablet 0  . ezetimibe (ZETIA) 10 MG tablet Take 1 tablet (10 mg total) by mouth daily. 30 tablet 0  . ferrous sulfate 325 (65 FE) MG tablet Take 1 tablet (325 mg total) by mouth daily. 30 tablet 0  . Fluticasone-Umeclidin-Vilant (TRELEGY ELLIPTA) 100-62.5-25 MCG/INH AEPB Take 1 puff by mouth daily. 60 each 0  . guaiFENesin-dextromethorphan (ROBITUSSIN DM) 100-10 MG/5ML syrup Take 10 mLs by mouth every 4 (four) hours as needed for cough. 118  mL 0  . Ipratropium-Albuterol (COMBIVENT) 20-100 MCG/ACT AERS respimat Inhale 1 puff into the lungs every 6 (six) hours as needed for wheezing or shortness of breath. 4 g 0  . lactose free nutrition (BOOST) LIQD Take 237 mLs by mouth in the morning and at bedtime.    . lip balm (CARMEX) ointment Apply topically as needed for lip care. 7 g 0  . montelukast (SINGULAIR) 10 MG tablet Take 1 tablet (10 mg total) by mouth daily. (Patient taking differently: Take 10 mg by mouth at bedtime as needed (breathing/allergies).) 30 tablet 3  . nitroGLYCERIN (NITROSTAT) 0.4 MG SL tablet Place 1 tablet (0.4 mg total) under the tongue every 5 (five) minutes as needed for chest pain. 30 tablet 0  . OLANZapine zydis (ZYPREXA) 5 MG disintegrating tablet Take 1 tablet (5 mg total) by mouth at bedtime.    Marland Kitchen omeprazole (PRILOSEC) 40 MG capsule Take 1 capsule (40 mg total) by mouth in the morning and at bedtime. Please schedule a yearly follow up: [phone]. Thank you 60 capsule 0  . OXYGEN Inhale 2 L into the lungs as needed. CPAP with oxygen at bedtime    . polyethylene glycol (MIRALAX) 17 g packet Take 17 g by mouth daily as needed (constipation.).    Marland Kitchen Propylene Glycol (SYSTANE BALANCE) 0.6 % SOLN Place 1 drop into both eyes daily as needed (dry eyes).    . Vitamin D3 (VITAMIN D) 25 MCG tablet Take 1 tablet (1,000 Units total) by mouth daily. 30 tablet 0  . Nutritional Supplements (NEPRO) LIQD Take 237 mLs by mouth 2 (two) times daily. For caloric/protein (Patient not taking: Reported on 04/11/2020)       Discharge Medications: Please see discharge summary for a list of discharge medications.  Relevant Imaging Results:  Relevant Lab Results:   Additional Information SS#: [id number]  Raina Mina, LCSWA

## 2020-04-12 NOTE — Progress Notes (Signed)
CSW spoke with daughter Cory Roughen @ [phone] to update about case.

## 2020-04-12 NOTE — TOC Initial Note (Signed)
Transition of Care [facility]) - Initial/Assessment Note    Patient Details  Name: Joann Gutierrez MRN: [id number] Date of Birth: [birth year]  Transition of Care [facility]) CM/SW Contact:    Raina Mina, [place] Phone Number: 04/12/2020, 2:54 PM  Clinical Narrative:  Patient was recommended for SNF long term placement. Patient had difficulty participating with PT and did not get up out of bed. CSW will send FL2 to see if patient will qualify and if insurance will approve authorization. Patient had trouble telling CSW about her ADL's.                    Expected Discharge Plan: Skilled Nursing Facility Barriers to Discharge: Continued Medical Work up   Patient Goals and CMS Choice Patient states their goals for this hospitalization and ongoing recovery are:: To go home      Expected Discharge Plan and Services Expected Discharge Plan: [place] arrangements for the past 2 months: [place]                                      Prior Living Arrangements/Services Living arrangements for the past 2 months: [place] Lives with:: Adult Children Patient language and need for interpreter reviewed:: Yes Do you feel safe going back to the place where you live?: Yes      Need for Family Participation in Patient Care: Yes (Comment) Care giver support system in place?: Yes (comment)   Criminal Activity/Legal Involvement Pertinent to Current Situation/Hospitalization: No - Comment as needed  Activities of Daily Living      Permission Sought/Granted      Share Information with NAME: Jonee Lamore     Permission granted to share info w Relationship: Son  Permission granted to share info w Contact Information: [phone]  Emotional Assessment Appearance:: Appears stated age   Affect (typically observed): Accepting (Cooperative with CSW) Orientation: : Oriented to Self,Oriented to  Place Alcohol / Substance Use: Not Applicable Psych Involvement: No (comment)  Admission diagnosis:  sick Patient Active Problem List   Diagnosis Date Noted  . Delirium 04/10/2020  . Adult failure to thrive 04/10/2020  . Acute bilat watershed infarction [facility]) 03/31/2020  . AMS (altered mental status) 03/29/2020  . Acute metabolic encephalopathy 20/25/4270  . ESRD on hemodialysis ([place]) 03/09/2020  . Thrombocytopenia ([place]) 03/09/2020  . Dementia with behavioral disturbance ([place]) 03/09/2020  . COVID-19   . SOB (shortness of breath)   . Type 2 diabetes mellitus with hyperlipidemia ([place])   . Sepsis ([place]) 02/16/2020  . Acute on chronic respiratory failure with hypoxia ([place]) 02/16/2020  . Edema of left lower leg 02/16/2020  . Pneumonia due to COVID-19 virus 02/15/2020  . Acute on chronic diastolic HF (heart failure) ([place]) 11/18/2019  . Dyslipidemia 11/18/2019  . Polypharmacy 04/13/2019  . Healthcare maintenance 10/15/2017  . ARF (acute renal failure) ([place]) 09/07/2017  . Chest pain 09/05/2017  . Ineffective health maintenance 06/26/2017  . Medication management 06/26/2017  . Retinal edema 10/07/2016  . Postherpetic neuralgia 12/21/2015  . Herpes zoster with complication 62/37/6283  . Cellulitis of face 11/27/2015  . Anemia of chronic disease 08/17/2015  . History of colonic polyps   . Hypertensive emergency 05/20/2015  . Hypokalemia 05/20/2015  . AKI (acute kidney injury) ([place])   . Esophageal dysmotility 03/01/2015  . Throat congestion 10/11/2014  .  Morbid obesity due to excess calories ([place]) 09/23/2014  . Acute asthmatic bronchitis 05/20/2014  . Abdominal pain, other specified site 09/13/2013  . Elevated liver enzymes 09/13/2013  . Cough 05/05/2013  . Pain in lower limb 03/05/2013  . DOE (dyspnea on exertion) 02/03/2013  . Chronic respiratory failure ([place]) 12/13/2012  . Stricture and stenosis of esophagus 08/31/2012  . Onychomycosis 06/01/2012  . Pain in joint, ankle and  foot 06/01/2012  . Diabetes mellitus type 2 in obese ([place]) 04/29/2012  . Essential hypertension 07/19/2011  . CKD (chronic kidney disease), stage III ([place]) 05/19/2011  . Family history of malignant neoplasm of gastrointestinal tract 10/30/2010  . Bloating 10/16/2010  . Epigastric pain 10/16/2010  . Foot pain 09/25/2010  . Dysphagia 07/16/2010  . GERD (gastroesophageal reflux disease) 07/16/2010  . Microcytic anemia 07/16/2010  . Obstructive sleep apnea 06/16/2009  . HYPERCHOLESTEROLEMIA 02/16/2009  . 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 Gastroparesis 08/21/2007  . Chronic obstructive asthma ([place]) 08/09/2006   PCP:  Andree Moro, DO Pharmacy:  No Pharmacies Listed    Social Determinants of Health (SDOH) Interventions    Readmission Risk Interventions No flowsheet data found.

## 2020-04-12 NOTE — ED Notes (Addendum)
PT resting calmly.

## 2020-04-12 NOTE — Evaluation (Signed)
Physical Therapy Evaluation Patient Details Name: Joann Gutierrez MRN: [id number] DOB: 1945-06-16 Today's Date: 04/12/2020   History of Present Illness  Pt is a 75 y/o female presenting to the ED on 3/21 secondary to failure to thrive. Pt with recent admission for metabolic encephalopathy. PMH includes CVA with bilateral watershed infarcts, DM, ESRD on HD, dementia, COPD, CHF and HTN.  Clinical Impression  Pt admitted secondary to problem above with deficits below. Pt resistive to mobility this session. Requiring total A to roll and for repositioning in bed. Pt keeping eyes closed throughout. Resistive to PROM in BLE. Unsure if pt will participate with therapies, however, will keep pt on caseload for trial basis. Feel pt may benefit from palliative consult and long term care placement. Will continue to follow acutely.     Follow Up Recommendations SNF (would benefit from long term care)    Equipment Recommendations  Wheelchair (measurements PT);Wheelchair cushion (measurements PT);Hospital bed (hoyer lift with pad)    Recommendations for Other Services Other (comment) (Palliative Consult?)     Precautions / Restrictions Precautions Precautions: Fall Precaution Comments: Per notes, can get aggressive Restrictions Weight Bearing Restrictions: No      Mobility  Bed Mobility Overal bed mobility: Needs Assistance Bed Mobility: Rolling Rolling: Total assist         General bed mobility comments: Total A for rolling on stretcher. Pt very resistive to any movement and pushing back against therapist.    Transfers                    Ambulation/Gait                Stairs            Wheelchair Mobility    Modified Rankin (Stroke Patients Only)       Balance                                             Pertinent Vitals/Pain Pain Assessment: Faces Faces Pain Scale: No hurt    Home Living Family/patient expects to be discharged  to:: Skilled nursing facility                 Additional Comments: Per notes, pt from home PTA, but looks as if family was having difficulty caring for her. Pt did not speak to  PT throughout session, so unsure of home environment.    Prior Function Level of Independence: Needs assistance   Gait / Transfers Assistance Needed: Per notes, has been bed bound. Pt unable to report           Hand Dominance        Extremity/Trunk Assessment   Upper Extremity Assessment Upper Extremity Assessment: Difficult to assess due to impaired cognition;Generalized weakness    Lower Extremity Assessment Lower Extremity Assessment: Difficult to assess due to impaired cognition;Generalized weakness       Communication   Communication: Expressive difficulties;HOH (pt not answering questions throughout. Would say "stop" with any attempts at movement.)  Cognition Arousal/Alertness: Lethargic Behavior During Therapy: Flat affect;Agitated Overall Cognitive Status: No family/caregiver present to determine baseline cognitive functioning                                 General Comments: Pt very resistive throughout. Would yell "  stop" with any attempts at movement. Otherwise not verbally responding and keeping eyes close.      General Comments General comments (skin integrity, edema, etc.): No family present    Exercises General Exercises - Lower Extremity Ankle Circles/Pumps: PROM;Both;5 reps Heel Slides: PROM;Both;5 reps;Supine   Assessment/Plan    PT Assessment Patient needs continued PT services  PT Problem List Decreased strength;Decreased range of motion;Decreased activity tolerance;Decreased balance;Decreased mobility;Decreased coordination;Decreased cognition;Obesity;Pain;Decreased knowledge of precautions;Decreased safety awareness;Decreased knowledge of use of DME       PT Treatment Interventions Gait training;DME instruction;Functional mobility  training;Therapeutic activities;Therapeutic exercise;Balance training;Cognitive remediation;Patient/family education    PT Goals (Current goals can be found in the Care Plan section)  Acute Rehab PT Goals PT Goal Formulation: Patient unable to participate in goal setting Time For Goal Achievement: 04/26/20 Potential to Achieve Goals: Poor    Frequency Min 2X/week   Barriers to discharge        Co-evaluation               AM-PAC PT "6 Clicks" Mobility  Outcome Measure Help needed turning from your back to your side while in a flat bed without using bedrails?: Total Help needed moving from lying on your back to sitting on the side of a flat bed without using bedrails?: Total Help needed moving to and from a bed to a chair (including a wheelchair)?: Total Help needed standing up from a chair using your arms (e.g., wheelchair or bedside chair)?: Total Help needed to walk in hospital room?: Total Help needed climbing 3-5 steps with a railing? : Total 6 Click Score: 6    End of Session   Activity Tolerance: Treatment limited secondary to agitation;Patient limited by lethargy Patient left: in bed;with call bell/phone within reach (on stretcher in ED) Nurse Communication: Mobility status PT Visit Diagnosis: Muscle weakness (generalized) (M62.81);Unsteadiness on feet (R26.81)    Time: 8144-8185 PT Time Calculation (min) (ACUTE ONLY): 10 min   Charges:   PT Evaluation $PT Eval Moderate Complexity: 1 Mod          Reuel Derby, PT, DPT  Acute Rehabilitation Services  Pager: [phone] Office: [phone]   Rudean Hitt 04/12/2020, 12:34 PM

## 2020-04-12 NOTE — ED Notes (Signed)
Spoke with daughter [name].  Referred her to CSW.

## 2020-04-12 NOTE — ED Notes (Addendum)
Updated son who is at bedside.

## 2020-04-12 NOTE — Progress Notes (Signed)
CSW contacted Shara Blazing, APS worker. CSW left a message.

## 2020-04-12 NOTE — Progress Notes (Signed)
Pt seen in ED, pt is boarding and not admitted. Will plan for continued TTS HD while here.HD tomorrow.  Pt seen today, no distress, very confused but pleasant. Rales L base, R clear, no sig LE edema. RUA AVF w/ poor bruit throughout except at the brachial fold. TDC in place in the chest as well. Using [facility] for now.     From March 2022 admission >  3.5h 400/800 ? kg 2/2 bath RIJ TDC Hep none  Kelly Splinter, MD 04/12/2020, 4:22 PM

## 2020-04-13 DIAGNOSIS — Z7189 Other specified counseling: Secondary | ICD-10-CM

## 2020-04-13 DIAGNOSIS — R627 Adult failure to thrive: Secondary | ICD-10-CM

## 2020-04-13 DIAGNOSIS — F0391 Unspecified dementia with behavioral disturbance: Secondary | ICD-10-CM

## 2020-04-13 LAB — CBC
HCT: 29.2 % — ABNORMAL LOW (ref 36.0–46.0)
Hemoglobin: 8.6 g/dL — ABNORMAL LOW (ref 12.0–15.0)
MCH: 23.8 pg — ABNORMAL LOW (ref 26.0–34.0)
MCHC: 29.5 g/dL — ABNORMAL LOW (ref 30.0–36.0)
MCV: 80.7 fL (ref 80.0–100.0)
Platelets: 138 K/uL — ABNORMAL LOW (ref 150–400)
RBC: 3.62 MIL/uL — ABNORMAL LOW (ref 3.87–5.11)
RDW: 20.2 % — ABNORMAL HIGH (ref 11.5–15.5)
WBC: 9.2 K/uL (ref 4.0–10.5)
nRBC: 0.7 % — ABNORMAL HIGH (ref 0.0–0.2)

## 2020-04-13 LAB — RENAL FUNCTION PANEL
Albumin: 2.7 g/dL — ABNORMAL LOW (ref 3.5–5.0)
Anion gap: 12 (ref 5–15)
BUN: 23 mg/dL (ref 8–23)
CO2: 26 mmol/L (ref 22–32)
Calcium: 9.2 mg/dL (ref 8.9–10.3)
Chloride: 101 mmol/L (ref 98–111)
Creatinine, Ser: 7.7 mg/dL — ABNORMAL HIGH (ref 0.44–1.00)
GFR, Estimated: 5 mL/min — ABNORMAL LOW (ref >60)
Glucose, Bld: 163 mg/dL — ABNORMAL HIGH (ref 70–99)
Phosphorus: 3.6 mg/dL (ref 2.5–4.6)
Potassium: 3.5 mmol/L (ref 3.5–5.1)
Sodium: 139 mmol/L (ref 135–145)

## 2020-04-13 MED ORDER — HEPARIN SODIUM (PORCINE) 1000 UNIT/ML IJ SOLN
INTRAMUSCULAR | Status: AC
  Administered 2020-04-13: 3200 [IU] via INTRAVENOUS
  Filled 2020-04-13: qty 4

## 2020-04-13 MED ORDER — HEPARIN SODIUM (PORCINE) 1000 UNIT/ML IJ SOLN
1000.0000 [IU] | INTRAMUSCULAR | Status: DC | PRN
  Filled 2020-04-13: qty 1

## 2020-04-13 NOTE — Progress Notes (Signed)
CSW called and left a message for [facility], patients sister. [phone]

## 2020-04-13 NOTE — Progress Notes (Signed)
CSW received call from son Joann Gutierrez. CSW explained that Pt had been denied for SNF again and that a new plan of care would need to be arranged.  Son agrees that S/O is not a capable caregiver and states that he and Pt's sister, Trenton Founds will need to rotate shifts at Pt's home to support S/O in caregiving duties so that Pt will be properly cared for in the home.  Joann Gutierrez states that he will speak with Trenton Founds and make arrangements for Pt supports to be in place so that Pt can return home in the next 24 hours.  CSW will update TOC leadership as well as first shift CSW

## 2020-04-13 NOTE — Progress Notes (Signed)
CSW attempted to reach Joann Gutierrez at [place] in regards to case [phone]. Left message requesting callback.

## 2020-04-13 NOTE — Procedures (Signed)
Pt seen on HD, low UF goal.  BP's stable and pt alert and in no distress. Pt remains in a "boarding" status in the ED.  If full admit will do formal consultation.   I was present at this dialysis session, have reviewed the session itself and made  appropriate changes Kelly Splinter MD [place] pager [phone]   04/13/2020, 1:06 PM

## 2020-04-13 NOTE — ED Notes (Signed)
To HD

## 2020-04-13 NOTE — ED Notes (Signed)
Patient awake alert, states she is not hungry and does not want to eat. She will not let RN or tech check her brief or do peri care at this time. Denies pain/discomfort.

## 2020-04-13 NOTE — Progress Notes (Signed)
CSW spoke with Juliann Pulse at [facility] who stated they do not have any long term beds but [facility] does. Juliann Pulse stated they are familiar with patient and will give CSW a call back after she speaks with admissions.

## 2020-04-13 NOTE — Progress Notes (Signed)
CSW contacted [place] to see if patient would be accepted. Joann Gutierrez stated she was on vacation but would let admissions know to go ahead and review and start insurance authorization. [place] unable to take patient.

## 2020-04-13 NOTE — ED Notes (Signed)
Patient placed in room 51, bedside report received form RN Lorenda Peck after verification form both this RN and charge RN [place] that patient is no longer Telemetry and awaiting SNF placement. Patient is awake, alert and confused. VS remain stable.
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
## 2020-04-13 NOTE — Progress Notes (Signed)
CSW called son [name] @ [phone] to discuss case. No answer left message requesting callback.

## 2020-04-13 NOTE — Progress Notes (Signed)
CSW called sister Trenton Founds to discuss case @ [phone]. Left message requesting callback.

## 2020-04-13 NOTE — Progress Notes (Signed)
CSW contacted [facility] care and spoke with admissions and was told that authorization was started for patient but they have not heard back yet from [facility].

## 2020-04-13 NOTE — Progress Notes (Signed)
Physical Therapy Treatment Patient Details Name: Joann Gutierrez MRN: [id number] DOB: 1945-07-19 Today's Date: 04/13/2020    History of Present Illness Pt is a 75 y/o female presenting to the ED on 3/21 secondary to failure to thrive. Pt with recent admission for metabolic encephalopathy. PMH includes CVA with bilateral watershed infarcts, DM, ESRD on HD, dementia, COPD, CHF and HTN. Noted pt with multiple admissions since January    PT Comments    Pt with limited to no progress.  She is not able to follow commands despite multimodal cues and multiple approaches.  Pt did sit EOB with min A but unable to progress further.  Pt has strength and physical ability to participate more but is limited by confusion.  By end of session , pt becoming agitated and swating at therapist hands. Unable to participate further.  Pt is not safe to return home.  She has had multiple admission since January with limited participation with therapy. Pt likely needing long term nursing home placement, but will continue trial of PT for a few more visits (this was only 2nd visit this admission) to see if can progress to decrease caregiver burden.     Follow Up Recommendations  SNF (More likely long term custodial care)     Equipment Recommendations  Wheelchair (measurements PT);Wheelchair cushion (measurements PT);Hospital bed (hoyer)    Recommendations for Other Services       Precautions / Restrictions Precautions Precautions: Fall Precaution Comments: Can be aggressive    Mobility  Bed Mobility Overal bed mobility: Needs Assistance Bed Mobility: Rolling;Sit to Supine;Supine to Sit Rolling: Max assist   Supine to sit: Min assist Sit to supine: Max assist;+2 for physical assistance   General bed mobility comments: At arrival pt at end of bed with legs hanging off L side.  She sat up on EOB multiple times with min A.  Anytime therapist mentioned trying to stand and therapist tried to assist pt would  lay back across bed.  To get back to safe position required max A x 2 to lay down and scoot up in bed.    Transfers Overall transfer level: Needs assistance               General transfer comment: Attempted to encourage multiple stands but pt not able to participate.  Tried general cues, facilitation technique to initiate, cues to "go for a walk" , but pt not follow any commands.  Ambulation/Gait                 Stairs             Wheelchair Mobility    Modified Rankin (Stroke Patients Only)       Balance Overall balance assessment: Needs assistance Sitting-balance support: No upper extremity supported;Feet supported Sitting balance-[name]: Fair Sitting balance - Comments: Pt could sit at EOB without support but would randomly just lay back across bed requiring assist to make sure she would not hit head on rail       Standing balance comment: unable                            Cognition Arousal/Alertness: Awake/alert Behavior During Therapy: Flat affect;Agitated Overall Cognitive Status: No family/caregiver present to determine baseline cognitive functioning Area of Impairment: Orientation;Attention;Memory;Following commands;Safety/judgement;Problem solving;Awareness                 Orientation Level: Disoriented to;Person;Place;Time;Situation Current Attention Level: Focused Memory:  Decreased recall of precautions;Decreased short-term memory Following Commands:  (follows no commands) Safety/Judgement: Decreased awareness of deficits;Decreased awareness of safety Awareness: Intellectual Problem Solving: Difficulty sequencing;Slow processing;Requires verbal cues;Requires tactile cues;Decreased initiation General Comments: Pt not following any commands.  Any time that she was asked to stand she would lay back across the bed.  When attempting to help pt get positioned in bed she would swat at therapist hands and stated "why are you  getting me back to where I was." Informed pt that she was at end of bed with her legs hanging off and therapist assisting back up into bed to her pillow.      Exercises      General Comments General comments (skin integrity, edema, etc.): No family present      Pertinent Vitals/Pain Pain Assessment: No/denies pain    Home Living                      Prior Function            PT Goals (current goals can now be found in the care plan section) Acute Rehab PT Goals PT Goal Formulation: Patient unable to participate in goal setting Time For Goal Achievement: 04/26/20 Potential to Achieve Goals: Poor Progress towards PT goals: Progressing toward goals (minimal progress)    Frequency    Min 2X/week      PT Plan Current plan remains appropriate    Co-evaluation              AM-PAC PT "6 Clicks" Mobility   Outcome Measure  Help needed turning from your back to your side while in a flat bed without using bedrails?: A Lot Help needed moving from lying on your back to sitting on the side of a flat bed without using bedrails?: A Lot Help needed moving to and from a bed to a chair (including a wheelchair)?: Total Help needed standing up from a chair using your arms (e.g., wheelchair or bedside chair)?: Total Help needed to walk in hospital room?: Total Help needed climbing 3-5 steps with a railing? : Total 6 Click Score: 8    End of Session Equipment Utilized During Treatment: Gait belt Activity Tolerance: Other (comment) (limited due to confusion) Patient left: in bed (transport staff present and moving to another room) Nurse Communication: Mobility status PT Visit Diagnosis: Muscle weakness (generalized) (M62.81);Unsteadiness on feet (R26.81)     Time: 8675-4492 PT Time Calculation (min) (ACUTE ONLY): 18 min  Charges:  $Therapeutic Activity: 8-22 mins                     Abran Richard, PT Acute Rehab Services Pager [phone] [facility]  [place] 04/13/2020, 4:13 PM

## 2020-04-13 NOTE — Progress Notes (Signed)
CSW contacted [employer] and they stated authorization was not denied and is still pending. CSW contacted [facility] and left a message.

## 2020-04-13 NOTE — Progress Notes (Signed)
PT Cancellation Note  Patient Details Name: Joann Gutierrez MRN: [id number] DOB: 22-Mar-1945   Cancelled Treatment:    Reason Eval/Treat Not Completed: Patient at procedure or test/unavailable. Pt at dialysis   Lyanne Co, DPT Acute Rehabilitation Services [phone]   [place] 04/13/2020, 9:43 AM

## 2020-04-13 NOTE — Progress Notes (Signed)
CSW reached out to Renal Navigator Terri Piedra for assistance to ensure continuity of care.

## 2020-04-13 NOTE — Care Management (Addendum)
ED RNCM discussed transitional care plan with ED CSW who spoke with family and the plan is for discharge home with family support and HH. ED  RNCM sent several referrals to [facility] agencies requesting services. [facility], [facility], [facility], [facility], [facility], and [facility]. Also placed [facility] referral as well, this patient may benefit from the services offered.
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
## 2020-04-13 NOTE — Progress Notes (Signed)
CSW received a call back from patients sister. [name] stated she plans on applying for medicaid. CSW also told the sister that patient will have to give up her check for long term care and if she has any assets that could also be an issue with being eligible for medicaid. [name] mentioned patient going to [facility]. CSW stated she does not believe that have long term beds. [name] stated they told her they did. CSW stated she will follow up with [facility] and let her know what they say.

## 2020-04-13 NOTE — Progress Notes (Signed)
CSW was notified that insurance denied SNF authorization again.

## 2020-04-13 NOTE — Progress Notes (Signed)
CSW left a message with admissions coordinator at [facility]

## 2020-04-14 NOTE — Progress Notes (Signed)
Pt seen in ED, pt is boarding and not admitted. Will plan for continued TTS HD while here. HD tomorrow.  RUA AVF has poor bruit. TDC in place in the chest as well. Using [facility] for now.      OP HD: TTS [place]  4h 97kg  3K/2Ca bath  TDC / RUA AVF 17ga (using TDC due to behavior issues)  Heparin none  - hectorol 3 ug tiw  - mircera 200 q2 last 3/01, due next HD  Kelly Splinter, MD 04/14/2020, 4:49 PM

## 2020-04-14 NOTE — Progress Notes (Signed)
CSW received call from [place].  As of this writing she has no filled out paperwork for Medicaid.
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
## 2020-04-14 NOTE — ED Notes (Addendum)
Pt refused food, drink and medicine multiple times. Pt repeating the phrase, "if she doesn't want to eat, she doesn't have to eat. If she doesn't want to take the pills she doesn't have to take them." Pt offered food but turned her head and spit it out.

## 2020-04-14 NOTE — ED Provider Notes (Signed)
Emergency Medicine Observation Re-evaluation Note  Joann Gutierrez is a 75 y.o. female, seen on rounds today.  Pt initially presented to the ED for complaints of Failure To Thrive Currently, the patient is being observed for possible placement.  Physical Exam  BP (!) 101/92 (BP Location: Left Arm)   Pulse 89   Temp 98.7 F (37.1 C) (Axillary)   Resp 18   Wt 93.3 kg   SpO2 97%   BMI 35.31 kg/m  Physical Exam General: Calm Cardiac: Normal heart rate Lungs: Normal respiratory rate Psych: Not responding to internal stimuli  ED Course / MDM  EKG:EKG Interpretation  Date/Time:  Monday April 10 2020 13:27:30 EDT Ventricular Rate:  81 PR Interval:    QRS Duration: 93 QT Interval:  399 QTC Calculation: 464 R Axis:   14 Text Interpretation: Sinus rhythm Ventricular premature complex Aberrant conduction of SV complex(es) Probable left atrial enlargement ST elevation, consider inferior injury Artifact Confirmed by Gareth Morgan [phone]) on 04/10/2020 2:25:39 PM Also confirmed by Gareth Morgan [phone]), editor Kaaawa, LaVerne [phone])  on 04/11/2020 8:38:48 AM   I have reviewed the labs performed to date as well as medications administered while in observation.  Recent changes in the last 24 hours include she is receiving ongoing close assessment by [facility] team, with current plan for discharge home with support.  She was seen by physical therapy yesterday who felt she needed close support, possibly SNF.  She was seen yesterday by palliative care.  Apparently discussion was had regarding CODE STATUS, without clear resolution of the divergent opinions of care team and family.  Patient remains on dialysis and is therefore not a hospice candidate at this time.  Plan  Current plan is for disposition in conjunction with TOC. Patient is not under full IVC at this time.   Daleen Bo, MD 04/16/20 2037

## 2020-04-14 NOTE — Progress Notes (Signed)
CSW contacted Shara Blazing and left a message.

## 2020-04-14 NOTE — Progress Notes (Signed)
CSW spoke with [employer] and [employer]. [employer] stated even if family applied for medicaid that corporate might not approve placement because they already have several medicaid cases pending. [employer] stated most likely patient will be denied. CSW spoke with Magda Paganini at [employer] who stated she would look at patient again. CSW notified Magda Paganini that family might apply for medicaid and give up patients check. CSW has received no answer from [facility].

## 2020-04-14 NOTE — Progress Notes (Signed)
CSW contacted [facility] who stated that they have not looked at patient yet but would look over the financial issue and call CSW back.

## 2020-04-14 NOTE — Progress Notes (Signed)
CSW received a call from patients daughter stating patient significant other called and stated that patient was approved for services for [facility]. CSW contacted [facility] and confirmed family will be working with Reinaldo Raddle.

## 2020-04-14 NOTE — ED Notes (Signed)
Dr. Kathrynn Humble ( EDP) notified on patient's hypertension .

## 2020-04-14 NOTE — Progress Notes (Signed)
CSW received call from Natividad Brood at [facility] that [facility] approves of the discharge plan wherein the family provides assistance and supervision to S/O for the care of Pt. TOC Supervisor updated.

## 2020-04-14 NOTE — Progress Notes (Signed)
CSW received a call from Kevin Fenton and [employer] who are the Geophysical data processor and supervisor at [facility]. Kevin Fenton stated he would staff the case with [facility] and get back to CSW on a decision. CSW informed him Mr. Joann Gutierrez that patient is ready for discharge and has been denied by insurance for placement. Ms. Verdis Prime was also told the same information and stated she would call right back after talking with Chauncey.

## 2020-04-14 NOTE — Progress Notes (Signed)
CSW spoke with [place] and asked if she applied for the medicaid. Renford Dills stated no and plans to go up there today. CSW stated for patient to go to long term she would need medicaid and the whole check would need to go to the facility. Renford Dills appeared to be hesitant to give up the money. Renford Dills stated she has [place] cummings and [place]'s daughters that could help change and care for patient when she comes home. Renford Dills stated that Marita [place] is a CNA. Renford Dills stated she is trying to build a ramp so patient can get to dialysis as well. CSW asked who is the POA over patients finances and she paused and stated no one right now and that she is trying to work on that. Renford Dills stated she was going to speak with [name] and call CSW back about the medicaid piece.

## 2020-04-14 NOTE — Progress Notes (Signed)
CSW received a call from Shara Blazing from [facility]. CSW informed him the plan is to send patient home. CSW explained the insurance was denied for SNF placement. CSW stated patients sister was supposed to apply for medicaid yesterday but doubts that happened and did not hear from her. CSW also stated that [place] did not seem thrilled yesterday to give up patients check. Park Liter stated that he spoke to the sister as well about that. Park Liter stated he does not feel comfortable discharging patient back home. CSW stated the hospital is out of option due to the family and patients insurance. Park Liter stated the family is going to have to give the check up. CSW stated the problem still is there is no medicaid put in place and facilities may not take patient with it pending either. Park Liter stated he was going to talk with his supervisor and give CSW a call back.

## 2020-04-15 LAB — CULTURE, BLOOD (ROUTINE X 2): Culture: NO GROWTH

## 2020-04-15 MED ORDER — HEPARIN SODIUM (PORCINE) 1000 UNIT/ML IJ SOLN
INTRAMUSCULAR | Status: AC
  Filled 2020-04-15: qty 4

## 2020-04-15 NOTE — ED Provider Notes (Signed)
  Physical Exam  BP (!) 110/46 (BP Location: Left Arm)   Pulse 73   Temp 98.5 F (36.9 C) (Oral)   Resp 16   Wt 93.8 kg   SpO2 97%   BMI 35.50 kg/m   Physical Exam Vitals and nursing note reviewed.  Constitutional:      General: She is not in acute distress.    Appearance: She is well-developed.  HENT:     Head: Normocephalic and atraumatic.  Eyes:     Conjunctiva/sclera: Conjunctivae normal.  Cardiovascular:     Rate and Rhythm: Normal rate.  Pulmonary:     Effort: Pulmonary effort is normal.  Musculoskeletal:        General: Normal range of motion.     Cervical back: Neck supple.  Skin:    General: Skin is warm and dry.  Neurological:     Mental Status: She is alert.     Comments: Clear speech     ED Course/Procedures   Clinical Course as of 04/15/20 1631  Mon Apr 10, 2020  1715 [facility] team states that patient does not qualify for SNF due to financial situation. [HK]  [id number] RN states that patient now hypotensive to 94R systolic.  Will give 500 cc bolus and obtain blood cultures. [HK]  1838 Spoke to Dr. Cheral Marker, neurologist.  Reviewed patient's prior imaging as well as today's lab work.  He is recommending MRI of the brain but does also feel that encephalopathy work-up including infectious causes is important at this time.  I have ordered an MRI. [HK]  1846 Blood pressure improved to 740 systolic with repositioning. [HK]  1906 BP: 122/60 [HK]    Clinical Course User Index [HK] Khatri, Hina, PA-C    Procedures  MDM   Briefly, patient seen initially for altered mental's status.  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  She was discharged home on 04/07/2020 and was denied SNF services due to insurance issues.  Family brought her back to the ED seeking placement however this was unable to be secured therefore home health organized and patient felt to be appropriate for upon discharge.  She was  dialyzed today and reassessed after this.  She is feeling well after dialysis and vital signs are reassuring.  Family is at bedside to take patient home.  Advised to follow-up with PCP and return if worse.      Bishop Dublin 04/15/20 1631    Daleen Bo, MD 04/16/20 2038

## 2020-04-15 NOTE — ED Notes (Addendum)
Pt returned form Dialysis.  Pt is anxious to leave and trying to get out of bed. Pt refusing lunch.   Daughter notified. Message left with sister. EDP notified to process DC. [facility]AR contacted by Secretary.

## 2020-04-15 NOTE — ED Notes (Signed)
Lunch tray ordered for pt.

## 2020-04-15 NOTE — ED Notes (Signed)
PT cleaned and brief changed.  PT placed in personal clothing before tx to [place].

## 2020-04-15 NOTE — Progress Notes (Signed)
CSW spoke with patient's family member [place] and provided update. CSW confirmed plan to DC home once dialysis is completed today. Per family patient will need ambulance transport back home and confirmed an estimated DC timeline for the afternoon.

## 2020-04-15 NOTE — Discharge Instructions (Signed)
Please follow up with your primary care provider within 5-7 days for re-evaluation of your symptoms. If you do not have a primary care provider, information for a healthcare clinic has been provided for you to make arrangements for follow up care. Please return to the emergency department for any new or worsening symptoms. ° °

## 2020-04-15 NOTE — Procedures (Signed)
   I was present at this dialysis session, have reviewed the session itself and made  appropriate changes Kelly Splinter MD [place] pager [phone]   04/15/2020, 11:26 AM

## 2020-04-15 NOTE — ED Notes (Signed)
Sister [name] is at bedside.

## 2020-04-15 NOTE — ED Notes (Signed)
Ptar called by Lani Havlik 

## 2020-04-15 NOTE — ED Notes (Addendum)
Spoke with [name] (sister and caregiver); requests phone call when pt back from dialysis [phone]

## 2020-04-15 NOTE — ED Notes (Signed)
AVS reviewed with family and sent with PTAR. Family verbalized understanding.  PT DC to PTAR,

## 2020-04-15 NOTE — ED Notes (Signed)
Report given to hemodialysis unit nurse this morning , will notify ER nurse when ready.

## 2020-04-15 NOTE — ED Notes (Signed)
Pt transported to dialysis

## 2020-04-15 NOTE — ED Notes (Signed)
Lunch tray arrived.   

## 2020-04-15 NOTE — ED Notes (Signed)
Pt was sitting on side of the bed. Pt now returned to bed.

## 2020-04-22 ENCOUNTER — Emergency Department (HOSPITAL_COMMUNITY)
Admission: EM | Admit: 2020-04-22 | Discharge: 2020-04-22 | Disposition: A | Discharge status: Home / Self Care | Payer: Medicare HMO | Attending: Emergency Medicine | Admitting: Emergency Medicine

## 2020-04-22 ENCOUNTER — Other Ambulatory Visit: Payer: Self-pay

## 2020-04-22 ENCOUNTER — Encounter (HOSPITAL_COMMUNITY): Payer: Self-pay

## 2020-04-22 DIAGNOSIS — Z87891 Personal history of nicotine dependence: Secondary | ICD-10-CM | POA: Insufficient documentation

## 2020-04-22 DIAGNOSIS — F0390 Unspecified dementia without behavioral disturbance: Secondary | ICD-10-CM | POA: Insufficient documentation

## 2020-04-22 DIAGNOSIS — Z992 Dependence on renal dialysis: Secondary | ICD-10-CM | POA: Insufficient documentation

## 2020-04-22 DIAGNOSIS — N186 End stage renal disease: Secondary | ICD-10-CM | POA: Insufficient documentation

## 2020-04-22 DIAGNOSIS — J449 Chronic obstructive pulmonary disease, unspecified: Secondary | ICD-10-CM | POA: Insufficient documentation

## 2020-04-22 DIAGNOSIS — Z7982 Long term (current) use of aspirin: Secondary | ICD-10-CM | POA: Insufficient documentation

## 2020-04-22 DIAGNOSIS — I2510 Atherosclerotic heart disease of native coronary artery without angina pectoris: Secondary | ICD-10-CM | POA: Diagnosis not present

## 2020-04-22 DIAGNOSIS — I9589 Other hypotension: Secondary | ICD-10-CM | POA: Insufficient documentation

## 2020-04-22 DIAGNOSIS — E1122 Type 2 diabetes mellitus with diabetic chronic kidney disease: Secondary | ICD-10-CM | POA: Insufficient documentation

## 2020-04-22 DIAGNOSIS — J45909 Unspecified asthma, uncomplicated: Secondary | ICD-10-CM | POA: Insufficient documentation

## 2020-04-22 DIAGNOSIS — Z7952 Long term (current) use of systemic steroids: Secondary | ICD-10-CM | POA: Diagnosis not present

## 2020-04-22 DIAGNOSIS — I5033 Acute on chronic diastolic (congestive) heart failure: Secondary | ICD-10-CM | POA: Diagnosis not present

## 2020-04-22 DIAGNOSIS — Z96652 Presence of left artificial knee joint: Secondary | ICD-10-CM | POA: Diagnosis not present

## 2020-04-22 DIAGNOSIS — Z8616 Personal history of COVID-19: Secondary | ICD-10-CM | POA: Diagnosis not present

## 2020-04-22 DIAGNOSIS — I959 Hypotension, unspecified: Secondary | ICD-10-CM | POA: Diagnosis present

## 2020-04-22 DIAGNOSIS — I132 Hypertensive heart and chronic kidney disease with heart failure and with stage 5 chronic kidney disease, or end stage renal disease: Secondary | ICD-10-CM | POA: Diagnosis not present

## 2020-04-22 LAB — CBC WITH DIFFERENTIAL/PLATELET
Abs Immature Granulocytes: 0.18 10*3/uL — ABNORMAL HIGH (ref 0.00–0.07)
Basophils Absolute: 0 10*3/uL (ref 0.0–0.1)
Basophils Relative: 0 %
Eosinophils Absolute: 0 10*3/uL (ref 0.0–0.5)
Eosinophils Relative: 0 %
HCT: 32.8 % — ABNORMAL LOW (ref 36.0–46.0)
Hemoglobin: 9.5 g/dL — ABNORMAL LOW (ref 12.0–15.0)
Immature Granulocytes: 2 %
Lymphocytes Relative: 22 %
Lymphs Abs: 2.2 10*3/uL (ref 0.7–4.0)
MCH: 24.4 pg — ABNORMAL LOW (ref 26.0–34.0)
MCHC: 29 g/dL — ABNORMAL LOW (ref 30.0–36.0)
MCV: 84.1 fL (ref 80.0–100.0)
Monocytes Absolute: 1.8 10*3/uL — ABNORMAL HIGH (ref 0.1–1.0)
Monocytes Relative: 19 %
Neutro Abs: 5.4 10*3/uL (ref 1.7–7.7)
Neutrophils Relative %: 57 %
Platelets: 163 10*3/uL (ref 150–400)
RBC: 3.9 MIL/uL (ref 3.87–5.11)
RDW: 21.3 % — ABNORMAL HIGH (ref 11.5–15.5)
WBC: 9.6 10*3/uL (ref 4.0–10.5)
nRBC: 2 % — ABNORMAL HIGH (ref 0.0–0.2)

## 2020-04-22 LAB — BASIC METABOLIC PANEL
Anion gap: 16 — ABNORMAL HIGH (ref 5–15)
BUN: 32 mg/dL — ABNORMAL HIGH (ref 8–23)
CO2: 28 mmol/L (ref 22–32)
Calcium: 9.2 mg/dL (ref 8.9–10.3)
Chloride: 100 mmol/L (ref 98–111)
Creatinine, Ser: 8.32 mg/dL — ABNORMAL HIGH (ref 0.44–1.00)
GFR, Estimated: 5 mL/min — ABNORMAL LOW (ref >60)
Glucose, Bld: 160 mg/dL — ABNORMAL HIGH (ref 70–99)
Potassium: 4.1 mmol/L (ref 3.5–5.1)
Sodium: 144 mmol/L (ref 135–145)

## 2020-04-22 NOTE — ED Notes (Signed)
Pt is a difficult stick. Pt has 22g iv in the left hand at this time, however unable to obtain blood work. IV team paged. Provider notified and aware.

## 2020-04-22 NOTE — ED Notes (Signed)
Provider at bedside. 20g US guided PIV placed by ED provider. Labs sent at this time.

## 2020-04-22 NOTE — ED Triage Notes (Signed)
Coming from dialysis, was too hypotensive to be dialyzed, history of dementia

## 2020-04-22 NOTE — ED Notes (Signed)
Phlebotomy and IV team unable to obtain peripheral IV access for blood work. ED provider aware and to place US guided IV.

## 2020-04-22 NOTE — ED Notes (Signed)
IV team at the bedside. 

## 2020-04-22 NOTE — ED Notes (Signed)
Phlebotomy paged to draw labs, per provider's verbal order.

## 2020-04-22 NOTE — ED Notes (Signed)
Family friend:  Clint Guy [phone]

## 2020-04-22 NOTE — ED Notes (Signed)
Pt's sister: [name]:  [phone]

## 2020-04-22 NOTE — Discharge Instructions (Signed)
Please call the dialysis center back and attempt to get an appointment for Monday morning.  If she has any episodes of passing out, worsening confusion or other new concerning symptom, return to ER for reassessment.  Recommend recheck with primary doctor or nephrologist early next week.

## 2020-04-22 NOTE — ED Notes (Signed)
Phlebotomy at the bedside  

## 2020-04-22 NOTE — ED Notes (Signed)
Per unit secretary PTAR paged and ETA is "hours."

## 2020-04-22 NOTE — ED Notes (Signed)
PTAR called for pt transport. 

## 2020-04-22 NOTE — ED Provider Notes (Signed)
[facility] DEPT Provider Note   CSN: [id number] Arrival date & time: 04/22/20  1342     History Chief Complaint  Patient presents with  . Hypotension    Joann Gutierrez is a 75 y.o. female.  Presents to ER with concern for hypotensive episode at dialysis.  Per report, patient was too hypotensive to be dialyzed.  Patient answered some basic questions, to this questioning she denied any acute complaints but she was unable to provide any complex historical elements..  History limited due to dementia.  Per chart review patient has a history of recent stroke, ESRD on dialysis, advanced dementia with delirium.  HPI     Past Medical History:  Diagnosis Date  . Adenomatous colon polyp   . Allergy   . Anemia   . Asthma       . CAD (coronary artery disease)    Mild very minimal coronary disease with 20% obtuse marginal stenosis  . Carpal tunnel syndrome on left   . Cataract   . CHF (congestive heart failure) ([place])   . Chronic kidney disease (CKD), stage III (moderate) (HCC)    now stage 4  . COPD (chronic obstructive pulmonary disease) ([place])   . Dementia with behavioral disturbance ([place]) 03/09/2020  . Depression   . 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 Diverticulosis   . Dyspnea   . 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 Esophageal dysmotility   . Esophageal stricture   . ESRD on hemodialysis ([place]) 03/09/2020  . Gastritis   . Gastroparesis 08/21/2007  . GERD (gastroesophageal reflux disease)   . Hearing loss of both ears   . Hernia, hiatal   . Hyperlipidemia   . Hypertension   . Morbid obesity ([place])   . OSTEOARTHRITIS 08/09/2006  . Osteoporosis   . Oxygen deficiency    prn   . 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 RENAL INSUFFICIENCY 02/16/2009  . 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 Seizures ([place])    pt thinks it has been several monthes since she had a seisure  . Shingles   . Sickle cell trait ([place])   .  Sleep apnea    uses cpap  . Stroke [facility])     Patient Active Problem List   Diagnosis Date Noted  . Delirium 04/10/2020  . Adult failure to thrive 04/10/2020  . Acute bilat watershed infarction [facility]) 03/31/2020  . AMS (altered mental status) 03/29/2020  . Acute metabolic encephalopathy 81/01/7508  . ESRD on hemodialysis ([place]) 03/09/2020  . Thrombocytopenia ([place]) 03/09/2020  . Dementia with behavioral disturbance ([place]) 03/09/2020  . COVID-19   . SOB (shortness of breath)   . Type 2 diabetes mellitus with hyperlipidemia ([place])   . Sepsis ([place]) 02/16/2020  . Acute on chronic respiratory failure with hypoxia ([place]) 02/16/2020  . Edema of left lower leg 02/16/2020  . Pneumonia due to COVID-19 virus 02/15/2020  . Acute on chronic diastolic HF (heart failure) ([place]) 11/18/2019  . Dyslipidemia 11/18/2019  . Polypharmacy 04/13/2019  . Healthcare maintenance 10/15/2017  . ARF (acute renal failure) ([place]) 09/07/2017  . Chest pain 09/05/2017  . Ineffective health maintenance 06/26/2017  . Medication management 06/26/2017  . Retinal edema 10/07/2016  . Postherpetic neuralgia 12/21/2015  . Herpes zoster with complication 25/85/2778  . Cellulitis of face 11/27/2015  . Anemia of chronic disease 08/17/2015  . History of colonic polyps   . Hypertensive emergency 05/20/2015  . Hypokalemia 05/20/2015  .  AKI (acute kidney injury) ([place])   . Esophageal dysmotility 03/01/2015  . Throat congestion 10/11/2014  . Morbid obesity due to excess calories ([place]) 09/23/2014  . Acute asthmatic bronchitis 05/20/2014  . Abdominal pain, other specified site 09/13/2013  . Elevated liver enzymes 09/13/2013  . Cough 05/05/2013  . Pain in lower limb 03/05/2013  . DOE (dyspnea on exertion) 02/03/2013  . Chronic respiratory failure ([place]) 12/13/2012  . Stricture and stenosis of esophagus 08/31/2012  . Onychomycosis 06/01/2012  . Pain in joint, ankle and foot 06/01/2012  . Diabetes mellitus type 2 in obese ([place])  04/29/2012  . Essential hypertension 07/19/2011  . CKD (chronic kidney disease), stage III ([place]) 05/19/2011  . Family history of malignant neoplasm of gastrointestinal tract 10/30/2010  . Bloating 10/16/2010  . Epigastric pain 10/16/2010  . Foot pain 09/25/2010  . Dysphagia 07/16/2010  . GERD (gastroesophageal reflux disease) 07/16/2010  . Microcytic anemia 07/16/2010  . Obstructive sleep apnea 06/16/2009  . HYPERCHOLESTEROLEMIA 02/16/2009  . 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 Gastroparesis 08/21/2007  . Chronic obstructive asthma ([place]) 08/09/2006    Past Surgical History:  Procedure Laterality Date  . ABDOMINAL HYSTERECTOMY    . ARTERY BIOPSY  01/07/2011   Procedure: MINOR BIOPSY TEMPORAL ARTERY;  Surgeon: Haywood Lasso, MD;  Location: [facility];  Service: General;  Laterality: Left;  left temporal artery biopsy  . AV FISTULA PLACEMENT Right 11/16/2018   Procedure: CREATION RIGHT BRACHIOCEPHALIC FISTULA  ARTERIOVENOUS FISTULA;  Surgeon: Angelia Mould, MD;  Location: [facility];  Service: Vascular;  Laterality: Right;  . bil foot surgery    . BREAST LUMPECTOMY     benign  . BREAST LUMPECTOMY     both breast lumps removed   . CATARACT EXTRACTION Left 06/2016   Dr. Read Drivers  . COLONOSCOPY    . COLONOSCOPY WITH PROPOFOL N/A 07/05/2015   Procedure: COLONOSCOPY WITH PROPOFOL;  Surgeon: Manus Gunning, MD;  Location: [facility];  Service: Gastroenterology;  Laterality: N/A;  . COLONOSCOPY WITH PROPOFOL N/A 08/04/2018   Procedure: COLONOSCOPY WITH PROPOFOL;  Surgeon: Yetta Flock, MD;  Location: [facility];  Service: Gastroenterology;  Laterality: N/A;  . ERD  08/08/2000  . ESOPHAGEAL MANOMETRY N/A 03/13/2015   Procedure: ESOPHAGEAL MANOMETRY (EM);  Surgeon: Manus Gunning, MD;  Location: [facility];  Service: Gastroenterology;  Laterality: N/A;  . ESOPHAGOGASTRODUODENOSCOPY  06/25/2006  . ESOPHAGOGASTRODUODENOSCOPY (EGD)  WITH PROPOFOL N/A 07/05/2015   Procedure: ESOPHAGOGASTRODUODENOSCOPY (EGD) WITH PROPOFOL;  Surgeon: Manus Gunning, MD;  Location: [facility];  Service: Gastroenterology;  Laterality: N/A;  . FISTULA SUPERFICIALIZATION Right 11/16/2018   Procedure: Fistula Superficialization;  Surgeon: Angelia Mould, MD;  Location: [facility];  Service: Vascular;  Laterality: Right;  . HERNIA REPAIR    . IR PERC TUN PERIT CATH WO PORT S&I D[facility]a [facility]  02/17/2020  . IR US GUIDE VASC ACCESS RIGHT  02/18/2020  . LEFT AND RIGHT HEART CATHETERIZATION WITH CORONARY ANGIOGRAM N/A 03/03/2013   Procedure: LEFT AND RIGHT HEART CATHETERIZATION WITH CORONARY ANGIOGRAM;  Surgeon: Minus Breeding, MD;  Location: [facility] CATH [facility];  Service: Cardiovascular;  Laterality: N/A;  . REPLACEMENT TOTAL KNEE Left 1998  . UPPER GASTROINTESTINAL ENDOSCOPY       OB History   No obstetric history on file.     Family History  Problem Relation Age of Onset  . Liver cancer Mother        Liver Cancer  . Diabetes Mother   . Kidney  disease Mother   . Heart disease Mother        age 35's  . Heart disease Father 65       MI  . Heart attack Father        died of MI when pt was 64  . Sickle cell anemia Father   . Colon cancer Brother   . Cancer Brother        Colon Cancer  . Diabetes Sister   . Kidney disease Sister   . Heart disease Sister        age 75's  . Allergies Sister   . Diabetes Sister   . Kidney disease Sister   . Heart disease Sister        age 19's  . Esophageal cancer Neg Hx   . Rectal cancer Neg Hx   . Stomach cancer Neg Hx   . Amblyopia Neg Hx   . Blindness Neg Hx   . Glaucoma Neg Hx   . Macular degeneration Neg Hx   . Retinal detachment Neg Hx   . Cataracts Neg Hx   . Strabismus Neg Hx   . Retinitis pigmentosa Neg Hx     Social History   Tobacco Use  . Smoking status: Former Smoker    Packs/day: 0.50    Years: 10.00    Pack years: 5.00    Quit date: 04/18/1980    Years since quitting: 40.0  .  Smokeless tobacco: Never Used  Vaping Use  . Vaping Use: Never used  Substance Use Topics  . Alcohol use: No    Alcohol/week: 0.0 standard drinks  . Drug use: No    Home Medications Prior to Admission medications   Medication Sig Start Date End Date Taking? Authorizing Provider  acetaminophen (TYLENOL) 325 MG tablet Take 650 mg by mouth every 4 (four) hours as needed for moderate pain.    [provider]  albuterol (VENTOLIN HFA) 108 (90 Base) MCG/ACT inhaler Inhale 2 puffs into the lungs every 4 (four) hours as needed for wheezing or shortness of breath. 04/08/20   Ghimire, Henreitta Leber, MD  ascorbic acid (VITAMIN C) 500 MG tablet Take 1 tablet (500 mg total) by mouth daily. 04/08/20   Ghimire, Henreitta Leber, MD  aspirin EC 81 MG tablet Take 1 tablet (81 mg total) by mouth daily. For 2 weeks-along with Plavix-after 2 weeks-stop aspirin. 04/08/20   Ghimire, Henreitta Leber, MD  azelastine (ASTELIN) 0.1 % nasal spray Place 1 spray into both nostrils 2 (two) times daily as needed (allergies). Use in each nostril as directed 04/08/20   Ghimire, Henreitta Leber, MD  bisacodyl (DULCOLAX) 5 MG EC tablet Take 1 tablet (5 mg total) by mouth daily as needed for moderate constipation. 04/08/20   Ghimire, Henreitta Leber, MD  calcium carbonate (OSCAL) 1500 (600 Ca) MG TABS tablet Take 1 tablet (1,500 mg total) by mouth daily. 04/08/20   Ghimire, Henreitta Leber, MD  clopidogrel (PLAVIX) 75 MG tablet Take 1 tablet (75 mg total) by mouth daily. 04/08/20   Ghimire, Henreitta Leber, MD  donepezil (ARICEPT) 5 MG tablet Take 1 tablet (5 mg total) by mouth at bedtime. 04/08/20   Ghimire, Henreitta Leber, MD  ezetimibe (ZETIA) 10 MG tablet Take 1 tablet (10 mg total) by mouth daily. 04/08/20   Ghimire, Henreitta Leber, MD  ferrous sulfate 325 (65 FE) MG tablet Take 1 tablet (325 mg total) by mouth daily. 04/08/20   Ghimire, Henreitta Leber, MD  Fluticasone-Umeclidin-Vilant (TRELEGY ELLIPTA) 100-62.5-25  MCG/INH AEPB Take 1 puff by mouth daily. 04/08/20   Ghimire,  Henreitta Leber, MD  guaiFENesin-dextromethorphan (ROBITUSSIN DM) 100-10 MG/5ML syrup Take 10 mLs by mouth every 4 (four) hours as needed for cough. 03/02/20   Sheikh, Omair Latif, DO  Ipratropium-Albuterol (COMBIVENT) 20-100 MCG/ACT AERS respimat Inhale 1 puff into the lungs every 6 (six) hours as needed for wheezing or shortness of breath. 04/08/20 05/08/20  Ghimire, Henreitta Leber, MD  lactose free nutrition (BOOST) LIQD Take 237 mLs by mouth in the morning and at bedtime.    [provider]  lip balm (CARMEX) ointment Apply topically as needed for lip care. 03/02/20   Sheikh, Omair Latif, DO  montelukast (SINGULAIR) 10 MG tablet Take 1 tablet (10 mg total) by mouth daily. Patient taking differently: Take 10 mg by mouth at bedtime as needed (breathing/allergies). 04/08/20   Ghimire, Henreitta Leber, MD  nitroGLYCERIN (NITROSTAT) 0.4 MG SL tablet Place 1 tablet (0.4 mg total) under the tongue every 5 (five) minutes as needed for chest pain. 04/08/20   Ghimire, Henreitta Leber, MD  Nutritional Supplements (NEPRO) LIQD Take 237 mLs by mouth 2 (two) times daily. For caloric/protein Patient not taking: Reported on 04/11/2020 04/08/20 05/08/20  Jonetta Osgood, MD  OLANZapine zydis (ZYPREXA) 5 MG disintegrating tablet Take 1 tablet (5 mg total) by mouth at bedtime. 04/07/20   Ghimire, Henreitta Leber, MD  omeprazole (PRILOSEC) 40 MG capsule Take 1 capsule (40 mg total) by mouth in the morning and at bedtime. Please schedule a yearly follow up: [phone]. Thank you 04/08/20   Jonetta Osgood, MD  OXYGEN Inhale 2 L into the lungs as needed. CPAP with oxygen at bedtime    [provider]  polyethylene glycol (MIRALAX) 17 g packet Take 17 g by mouth daily as needed (constipation.). 04/08/20   Ghimire, Henreitta Leber, MD  Propylene Glycol (SYSTANE BALANCE) 0.6 % SOLN Place 1 drop into both eyes daily as needed (dry eyes).    [provider]  Vitamin D3 (VITAMIN D) 25 MCG tablet Take 1 tablet (1,000 Units total) by mouth  daily. 04/08/20   Ghimire, Henreitta Leber, MD    Allergies    Morphine and related and Promethazine hcl  Review of Systems   Review of Systems  Unable to perform ROS: Dementia    Physical Exam Updated Vital Signs BP (!) 131/53   Pulse 75   Temp 97.8 F (36.6 C) (Oral)   Resp 16   Ht 5\' 4"  (1.626 m)   Wt 94 kg   SpO2 98%   BMI 35.57 kg/m   Physical Exam Vitals and nursing note reviewed.  Constitutional:      General: She is not in acute distress.    Appearance: She is well-developed.  HENT:     Head: Normocephalic and atraumatic.  Eyes:     Conjunctiva/sclera: Conjunctivae normal.  Cardiovascular:     Rate and Rhythm: Normal rate and regular rhythm.     Heart sounds: No murmur heard.   Pulmonary:     Effort: Pulmonary effort is normal. No respiratory distress.     Breath sounds: Normal breath sounds.  Abdominal:     Palpations: Abdomen is soft.     Tenderness: There is no abdominal tenderness.  Musculoskeletal:        General: No deformity or signs of injury.     Cervical back: Neck supple.  Skin:    General: Skin is warm and dry.  Neurological:  Mental Status: She is alert.     Comments: Alert, answers basic questions     ED Results / Procedures / Treatments   Labs (all labs ordered are listed, but only abnormal results are displayed) Labs Reviewed  CBC WITH DIFFERENTIAL/PLATELET - Abnormal; Notable for the following components:      Result Value   Hemoglobin 9.5 (*)    HCT 32.8 (*)    MCH 24.4 (*)    MCHC 29.0 (*)    RDW 21.3 (*)    nRBC 2.0 (*)    Monocytes Absolute 1.8 (*)    Abs Immature Granulocytes 0.18 (*)    All other components within normal limits  BASIC METABOLIC PANEL - Abnormal; Notable for the following components:   Glucose, Bld 160 (*)    BUN 32 (*)    Creatinine, Ser 8.32 (*)    GFR, Estimated 5 (*)    Anion gap 16 (*)    All other components within normal limits    EKG EKG Interpretation  Date/Time:  Saturday April 22 2020 14:09:40 EDT Ventricular Rate:  79 PR Interval:    QRS Duration: 105 QT Interval:  423 QTC Calculation: 485 R Axis:   66 Text Interpretation: Normal sinus rhythm poor baseline quality no acute STEMI no acute change from prior Confirmed by Madalyn Rob [phone]) on 04/22/2020 6:29:43 PM   Radiology No results found.  Procedures Ultrasound ED Peripheral IV (Provider)  Date/Time: 04/22/2020 11:29 PM Performed by: Lucrezia Starch, MD Authorized by: Lucrezia Starch, MD   Procedure details:    Indications: hydration, multiple failed IV attempts and poor IV access     Location:  Left AC   Angiocath:  20 G   Bedside Ultrasound Guided: Yes     Images: not archived     Patient tolerated procedure without complications: Yes     Dressing applied: Yes       Medications Ordered in ED Medications - No data to display  ED Course  I have reviewed the triage vital signs and the nursing notes.  Pertinent labs & imaging results that were available during my care of the patient were reviewed by me and considered in my medical decision making (see chart for details).    MDM Rules/Calculators/A&P                          75 year old lady presented to ER with concern for hypotension at dialysis. She has a notable history for ESRD, dementia, stroke.   On physical exam, patient appeared stable.  Her basic labs were stable.  Elevation in creatinine and BUN but electrolytes were stable.  EKG without acute ischemic change.  I discussed these findings with on-call nephrology, Dr. Johnney Ou.  She feels she is stable to defer dialysis for now and does not need to be admitted or held for dialysis. Recommended family call dialysis center first thing Monday to try getting session on Monday given missed session today.   Final Clinical Impression(s) / ED Diagnoses Final diagnoses:  Transient hypotension    Rx / DC Orders ED Discharge Orders    None       Lucrezia Starch, MD 04/22/20  [phone]

## 2020-04-22 NOTE — ED Notes (Signed)
Pt attached to cardiac monitor x3. Pt receiving 500cc bolus d/t hypotensive BP's.

## 2020-04-26 ENCOUNTER — Telehealth: Payer: Self-pay

## 2020-04-26 NOTE — Telephone Encounter (Signed)
Spoke with patient's caregiver [name] and scheduled an in-person Palliative Consult for 05/08/20 @ 12:30PM.  COVID screening was negative. No pets in home. Patient's caregiver lives with her.   Consent obtained; updated Outlook/Netsmart/Team List and Epic.  Family is aware they may be receiving a call from NP the day before or day of to confirm appointment.

## 2020-05-08 ENCOUNTER — Other Ambulatory Visit: Payer: Self-pay

## 2020-05-08 ENCOUNTER — Other Ambulatory Visit: Payer: Self-pay | Admitting: Student

## 2020-05-08 ENCOUNTER — Telehealth: Payer: Self-pay

## 2020-05-08 DIAGNOSIS — F01518 Vascular dementia, unspecified severity, with other behavioral disturbance: Secondary | ICD-10-CM

## 2020-05-08 DIAGNOSIS — Z992 Dependence on renal dialysis: Secondary | ICD-10-CM

## 2020-05-08 DIAGNOSIS — Z515 Encounter for palliative care: Secondary | ICD-10-CM

## 2020-05-08 DIAGNOSIS — F0151 Vascular dementia with behavioral disturbance: Secondary | ICD-10-CM

## 2020-05-08 DIAGNOSIS — N186 End stage renal disease: Secondary | ICD-10-CM

## 2020-05-08 NOTE — Progress Notes (Signed)
[facility] Palliative Care Consult Note Telephone: [phone]  Fax: [phone]    Date of encounter: 05/08/20 PATIENT NAME: Joann Gutierrez [place] [address]   [phone] (home)  DOB: 12-Jan-1946 MRN: [id number] PRIMARY CARE PROVIDER:    Andree Moro, DO,  [place] [address] [phone]  REFERRING PROVIDER:   Andree Moro, [address] [place],  [place] [address] [phone]  RESPONSIBLE PARTY:    Contact Information    Name Relation Home Work Mobile   Ossian., [employer] Son   [phone]   Delorise Shiner Other   [phone]   McWilliams,[name] Denman George [phone]  [phone]       I met face to face with patient and family in the home. Palliative Care was asked to follow this patient by consultation request of  Andree Moro, DO to address advance care planning and complex medical decision making. This is the initial visit.                                     ASSESSMENT AND PLAN / RECOMMENDATIONS:   Advance Care Planning/Goals of Care: Goals include to maximize quality of life and symptom management. Continue HD as able to tolerate. Our advance care planning conversation included a discussion about:     The value and importance of advance care planning   Experiences with loved ones who have been seriously ill or have died   Exploration of personal, cultural or spiritual beliefs that might influence medical decisions   Exploration of goals of care in the event of a sudden injury or illness   Identification and preparation of a healthcare agent   MOST form presented and left in the home; will complete once speaking with HCPOA  Decision not to resuscitate or to de-escalate disease focused treatments due to poor prognosis  CODE STATUS: Full Code  NP met with significant other Clint Guy and caregiver Stormy from [employer] present. [name] states that patient's  sister has HCPOA, documents not present. Education provided on Palliative Medicine vs. Hospice services. Explained frequency of visits.  NP later received call from patient's daughter Delorise Shiner; she visited after this NP left today and was given my contact information by patient's significant other [name]. She states that there is no HCPOA in place. She is unsure if there is an open APS case. She expresses comfort for her mother. She states she had elected for patient to be a DNR during recent hospitalization and this was reversed by patient's sister [name] and patient's son [name]. She also expresses concerns with care currently being provided.  NP explained that given family dynamics, Palliative SW will be notified to check on involvement of APS as well as HCPOA.  Symptom Management/Plan:  ESRD-patient currently receiving Hemodialysis on Tues/Thursday/Saturday. Introduced hospice if patient is unable to continue HD.  GFR less than 5, BUN 32, creatinine 8.32 on 04/22/20. She has agitation and family has had to go to dialysis center to help calm/redirect her. Continue HD as able to tolerate. Family instructed to pick up prescription for olanzapine 1m daily.   Dementia-patient with increased agitation, poor appetite. Recommend family to pick up prescription for olanzapine 567mdaily; continue Aricept as directed. Reorient/redirect as needed. Family to continue to provide support along with supplemental caregivers with [facility]. Family providing nutritional supplements; recommend obtaining the Nepro given her ESRD.  Referral made to Palliative SW given family dynamics, HCPOA questions/concerns, and to follow up on if there is an open APS case.      Follow up Palliative Care Visit: Palliative care will continue to follow for complex medical decision making, advance care planning, and clarification of goals. Return in 4 weeks or prn.  I spent 75 minutes providing this consultation. More than  50% of the time in this consultation was spent in counseling and care coordination.   PPS: 30%  HOSPICE ELIGIBILITY/DIAGNOSIS: TBD  Chief Complaint: Palliative Medicine initial consult, ESRD with hemodialysis.   HISTORY OF PRESENT ILLNESS:  Joann Gutierrez is a 75 y.o. year old female  With ESRD, receiving hemodialysis on Tuesday/Thursday/Saturday. Patient with multiple ER visits and hospitalizations since the end of January. Most recent ER visit on 04/22/20 for transient hypotension. She was also at [facility] for a couple of weeks and then discharged home.   Patient resides at home with significant other Clint Guy. She has caregivers on Monday, Wednesday, Friday with [facility]. Today, patient is agitated, spitting at caregiver and refusing for caregiver to provide care. [name] states patient normally sleeps in and it is more difficult for people to work with her in the AM. He states she does better in the afternoon/evenings. She is dependent for all adl's. Hoyer lift used for transfers. Caregiver, Stormy states she has only been able to get patient out of bed to recliner once in the past two weeks. Poor appetite reported. She will sometimes refuse medications. PRN oxygen in the home; has been on room air. Patient has a make shift ramp to from porch. [name] has contacted several organizations and is awaiting return call.   History obtained from review of EMR, discussion with primary team, and interview with family, facility staff/caregiver and/or Ms. Mahnke.  I reviewed available labs, medications, imaging, studies and related documents from the EMR.  Records reviewed and summarized above.   ROS  Patient unable to contribute to ROS due to impaired cognition.   Physical Exam: Pulse 68, resp 16, blood pressure deferred d/t agitation Constitutional: NAD General: frail appearing, obese  EYES: anicteric sclera, lids intact, no discharge  ENMT: intact hearing, oral mucous  membranes moist, dentition intact CV: S1S2, RRR, trace LE edema Pulmonary: LCTA, no increased work of breathing, no cough, room air Abdomen: normo-active BS + 4 quadrants, soft and non tender GU: deferred MSK: sarcopenia, moves all extremities, non-ambulatory Skin: warm and dry, no rashes or wounds on visible skin, HD catheter to right upper chest wall Neuro: generalized weakness Psych: patient agitated with attempt in adl care, A & O to person/place Hem/lymph/immuno: no widespread bruising   CURRENT PROBLEM LIST:  Patient Active Problem List   Diagnosis Date Noted  . Delirium 04/10/2020  . Adult failure to thrive 04/10/2020  . Acute bilat watershed infarction [facility]) 03/31/2020  . AMS (altered mental status) 03/29/2020  . Acute metabolic encephalopathy 17/49/4496  . ESRD on hemodialysis ([place]) 03/09/2020  . Thrombocytopenia ([place]) 03/09/2020  . Dementia with behavioral disturbance ([place]) 03/09/2020  . COVID-19   . SOB (shortness of breath)   . Type 2 diabetes mellitus with hyperlipidemia ([place])   . Sepsis ([place]) 02/16/2020  . Acute on chronic respiratory failure with hypoxia ([place]) 02/16/2020  . Edema of left lower leg 02/16/2020  . Pneumonia due to COVID-19 virus 02/15/2020  . Acute on chronic diastolic HF (heart failure) ([place]) 11/18/2019  . Dyslipidemia 11/18/2019  . Polypharmacy 04/13/2019  . Healthcare  maintenance 10/15/2017  . ARF (acute renal failure) ([place]) 09/07/2017  . Chest pain 09/05/2017  . Ineffective health maintenance 06/26/2017  . Medication management 06/26/2017  . Retinal edema 10/07/2016  . Postherpetic neuralgia 12/21/2015  . Herpes zoster with complication 13/24/4010  . Cellulitis of face 11/27/2015  . Anemia of chronic disease 08/17/2015  . History of colonic polyps   . Hypertensive emergency 05/20/2015  . Hypokalemia 05/20/2015  . AKI (acute kidney injury) ([place])   . Esophageal dysmotility 03/01/2015  . Throat congestion 10/11/2014  . Morbid obesity due  to excess calories ([place]) 09/23/2014  . Acute asthmatic bronchitis 05/20/2014  . Abdominal pain, other specified site 09/13/2013  . Elevated liver enzymes 09/13/2013  . Cough 05/05/2013  . Pain in lower limb 03/05/2013  . DOE (dyspnea on exertion) 02/03/2013  . Chronic respiratory failure ([place]) 12/13/2012  . Stricture and stenosis of esophagus 08/31/2012  . Onychomycosis 06/01/2012  . Pain in joint, ankle and foot 06/01/2012  . Diabetes mellitus type 2 in obese ([facility] [place] [facility]) 04/29/2012  . Essential hypertension 07/19/2011  . CKD (chronic kidney disease), stage III ([place]) 05/19/2011  . Family history of malignant neoplasm of gastrointestinal tract 10/30/2010  . Bloating 10/16/2010  . Epigastric pain 10/16/2010  . Foot pain 09/25/2010  . Dysphagia 07/16/2010  . GERD (gastroesophageal reflux disease) 07/16/2010  . Microcytic anemia 07/16/2010  . Obstructive sleep apnea 06/16/2009  . HYPERCHOLESTEROLEMIA 02/16/2009  . 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 Gastroparesis 08/21/2007  . Chronic obstructive asthma ([place]) 08/09/2006   PAST MEDICAL HISTORY:  Active Ambulatory Problems    Diagnosis Date Noted  . HYPERCHOLESTEROLEMIA 02/16/2009  . Obstructive sleep apnea 06/16/2009  . 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 Chronic obstructive asthma ([place]) 08/09/2006  . Gastroparesis 08/21/2007  . Dysphagia 07/16/2010  . GERD (gastroesophageal reflux disease) 07/16/2010  . Microcytic anemia 07/16/2010  . Foot pain 09/25/2010  . Bloating 10/16/2010  . Epigastric pain 10/16/2010  . Family history of malignant neoplasm of gastrointestinal tract 10/30/2010  . CKD (chronic kidney disease), stage III ([place]) 05/19/2011  . Essential hypertension 07/19/2011  . Diabetes mellitus type 2 in obese ([place]) 04/29/2012  . Onychomycosis 06/01/2012  . Pain in joint, ankle and foot 06/01/2012  . Stricture and stenosis of esophagus 08/31/2012  . Chronic respiratory failure ([place]) 12/13/2012  . DOE (dyspnea on  exertion) 02/03/2013  . Pain in lower limb 03/05/2013  . Cough 05/05/2013  . Abdominal pain, other specified site 09/13/2013  . Elevated liver enzymes 09/13/2013  . Acute asthmatic bronchitis 05/20/2014  . Morbid obesity due to excess calories ([place]) 09/23/2014  . Throat congestion 10/11/2014  . Esophageal dysmotility 03/01/2015  . Hypertensive emergency 05/20/2015  . Hypokalemia 05/20/2015  . AKI (acute kidney injury) ([place])   . History of colonic polyps   . Anemia of chronic disease 08/17/2015  . Cellulitis of face 11/27/2015  . Herpes zoster with complication 27/25/3664  . Postherpetic neuralgia 12/21/2015  . Retinal edema 10/07/2016  . Ineffective health maintenance 06/26/2017  . Medication management 06/26/2017  . Chest pain 09/05/2017  . ARF (acute renal failure) ([place]) 09/07/2017  . Healthcare maintenance 10/15/2017  . Polypharmacy 04/13/2019  . Acute on chronic diastolic HF (heart failure) ([place]) 11/18/2019  . Dyslipidemia 11/18/2019  . Pneumonia due to COVID-19 virus 02/15/2020  . Sepsis ([place]) 02/16/2020  . Acute on chronic respiratory failure with hypoxia ([place]) 02/16/2020  . Edema of left lower leg 02/16/2020  . COVID-19   . SOB (shortness of breath)   .  Type 2 diabetes mellitus with hyperlipidemia ([place])   . Acute metabolic encephalopathy 63/87/5643  . ESRD on hemodialysis ([place]) 03/09/2020  . Thrombocytopenia ([place]) 03/09/2020  . Dementia with behavioral disturbance ([place]) 03/09/2020  . AMS (altered mental status) 03/29/2020  . Acute bilat watershed infarction [facility]) 03/31/2020  . Delirium 04/10/2020  . Adult failure to thrive 04/10/2020   Resolved Ambulatory Problems    Diagnosis Date Noted  . ANEMIA, MILD 11/09/2007  . DEPRESSION 08/09/2006  . HYPERTENSION 08/09/2006  . 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 ALLERGIC RHINITIS 08/09/2006  . Pneumonia, organism unspecified(486) 12/05/2009  . GERD 08/09/2006  . DYSPEPSIA 07/28/2007  . RENAL INSUFFICIENCY 02/16/2009   . OSTEOARTHRITIS 08/09/2006  . OSTEOPOROSIS 08/09/2006  . SKIN RASH 07/06/2007  . Headache(784.0) 01/26/2007  . DYSPNEA 07/17/2009  . DYSPHAGIA UNSPECIFIED 07/28/2007  . ABDOMINAL PAIN RIGHT LOWER QUADRANT 06/08/2008  . MYOCARDIAL PERFUSION SCAN, WITH STRESS TEST, ABNORMAL 07/17/2009  . Other dysphagia 03/14/2010  . Acute asthma exacerbation 07/03/2010  . Family history of colon cancer 07/16/2010  . Headache(784.0) 12/25/2010  . Acute-on-chronic respiratory failure ([place]) 05/19/2011  . Bronchitis 05/19/2011  . Leukocytosis 05/19/2011  . Tachycardia 05/19/2011  . URI (upper respiratory infection) 07/19/2011  . Acute sinusitis 12/04/2011  . Acute bronchitis 10/19/2012  . Nasal sinus congestion 12/13/2012  . Hemoptysis 04/02/2013  . Biliary colic 32/95/1884  . Encephalopathy acute 03/10/2020   Past Medical History:  Diagnosis Date  . Adenomatous colon polyp   . Allergy   . Anemia   . Asthma   . CAD (coronary artery disease)   . Carpal tunnel syndrome on left   . Cataract   . CHF (congestive heart failure) ([place])   . Chronic kidney disease (CKD), stage III (moderate) ([facility])   . COPD (chronic obstructive pulmonary disease) ([place])   . Depression   . Diabetes mellitus 1997  . Diverticulosis   . Dyspnea   . Elevated diaphragm November 2011  . Esophageal stricture   . Gastritis   . Hearing loss of both ears   . Hernia, hiatal   . Hyperlipidemia   . Hypertension   . Morbid obesity ([place])   . Osteoporosis   . Oxygen deficiency   . 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 Seizures ([place])   . Shingles   . Sickle cell trait ([place])   . Sleep apnea   . Stroke [facility])    SOCIAL HX:  Social History   Tobacco Use  . Smoking status: Former Smoker    Packs/day: 0.50    Years: 10.00    Pack years: 5.00    Quit date: 04/18/1980    Years since quitting: 40.0  . Smokeless tobacco: Never Used  Substance Use Topics  . Alcohol use: No    Alcohol/week: 0.0 standard drinks    FAMILY HX:  Family History  Problem Relation Age of Onset  . Liver cancer Mother        Liver Cancer  . Diabetes Mother   . Kidney disease Mother   . Heart disease Mother        age 11's  . Heart disease Father 12       MI  . Heart attack Father        died of MI when pt was 30  . Sickle cell anemia Father   . Colon cancer Brother   . Cancer Brother        Colon Cancer  . Diabetes Sister   . Kidney disease Sister   .  Heart disease Sister        age 20's  . Allergies Sister   . Diabetes Sister   . Kidney disease Sister   . Heart disease Sister        age 13's  . Esophageal cancer Neg Hx   . Rectal cancer Neg Hx   . Stomach cancer Neg Hx   . Amblyopia Neg Hx   . Blindness Neg Hx   . Glaucoma Neg Hx   . Macular degeneration Neg Hx   . Retinal detachment Neg Hx   . Cataracts Neg Hx   . Strabismus Neg Hx   . Retinitis pigmentosa Neg Hx      ALLERGIES:  Allergies  Allergen Reactions  . Morphine And Related Other (See Comments)    Family request not to be given, reports pt does not wake up when given   . Promethazine Hcl Other (See Comments)    REACTION: lethargy     PERTINENT MEDICATIONS:  Outpatient Encounter Medications as of 05/08/2020  Medication Sig  . acetaminophen (TYLENOL) 325 MG tablet Take 650 mg by mouth every 4 (four) hours as needed for moderate pain.  Marland Kitchen albuterol (VENTOLIN HFA) 108 (90 Base) MCG/ACT inhaler Inhale 2 puffs into the lungs every 4 (four) hours as needed for wheezing or shortness of breath.  Marland Kitchen aspirin EC 81 MG tablet Take 1 tablet (81 mg total) by mouth daily. For 2 weeks-along with Plavix-after 2 weeks-stop aspirin.  Marland Kitchen azelastine (ASTELIN) 0.1 % nasal spray Place 1 spray into both nostrils 2 (two) times daily as needed (allergies). Use in each nostril as directed  . calcium carbonate (OSCAL) 1500 (600 Ca) MG TABS tablet Take 1 tablet (1,500 mg total) by mouth daily.  Marland Kitchen donepezil (ARICEPT) 5 MG tablet Take 1 tablet (5 mg total) by mouth at  bedtime.  Marland Kitchen ezetimibe (ZETIA) 10 MG tablet Take 1 tablet (10 mg total) by mouth daily.  . Fluticasone-Umeclidin-Vilant (TRELEGY ELLIPTA) 100-62.5-25 MCG/INH AEPB Take 1 puff by mouth daily.  . Ipratropium-Albuterol (COMBIVENT) 20-100 MCG/ACT AERS respimat Inhale 1 puff into the lungs every 6 (six) hours as needed for wheezing or shortness of breath.  . montelukast (SINGULAIR) 10 MG tablet Take 1 tablet (10 mg total) by mouth daily. (Patient taking differently: Take 10 mg by mouth at bedtime as needed (breathing/allergies).)  . OXYGEN Inhale 2 L into the lungs as needed. CPAP with oxygen at bedtime  . Propylene Glycol (SYSTANE BALANCE) 0.6 % SOLN Place 1 drop into both eyes daily as needed (dry eyes).  . Vitamin D3 (VITAMIN D) 25 MCG tablet Take 1 tablet (1,000 Units total) by mouth daily.  Marland Kitchen ascorbic acid (VITAMIN C) 500 MG tablet Take 1 tablet (500 mg total) by mouth daily. (Patient not taking: Reported on 05/08/2020)  . bisacodyl (DULCOLAX) 5 MG EC tablet Take 1 tablet (5 mg total) by mouth daily as needed for moderate constipation.  . clopidogrel (PLAVIX) 75 MG tablet Take 1 tablet (75 mg total) by mouth daily.  . ferrous sulfate 325 (65 FE) MG tablet Take 1 tablet (325 mg total) by mouth daily. (Patient not taking: Reported on 05/08/2020)  . guaiFENesin-dextromethorphan (ROBITUSSIN DM) 100-10 MG/5ML syrup Take 10 mLs by mouth every 4 (four) hours as needed for cough.  . lactose free nutrition (BOOST) LIQD Take 237 mLs by mouth in the morning and at bedtime.  . lip balm (CARMEX) ointment Apply topically as needed for lip care.  . nitroGLYCERIN (NITROSTAT) 0.4 MG SL  tablet Place 1 tablet (0.4 mg total) under the tongue every 5 (five) minutes as needed for chest pain.  . Nutritional Supplements (NEPRO) LIQD Take 237 mLs by mouth 2 (two) times daily. For caloric/protein (Patient not taking: No sig reported)  . OLANZapine zydis (ZYPREXA) 5 MG disintegrating tablet Take 1 tablet (5 mg total) by mouth at  bedtime.  Marland Kitchen omeprazole (PRILOSEC) 40 MG capsule Take 1 capsule (40 mg total) by mouth in the morning and at bedtime. Please schedule a yearly follow up: [phone]. Thank you (Patient not taking: Reported on 05/08/2020)  . polyethylene glycol (MIRALAX) 17 g packet Take 17 g by mouth daily as needed (constipation.). (Patient not taking: Reported on 05/08/2020)   No facility-administered encounter medications on file as of 05/08/2020.    Thank you for the opportunity to participate in the care of Ms. Kracke.  The palliative care team will continue to follow. Please call our office at [phone] if we can be of additional assistance.   Ezekiel Slocumb, NP  COVID-19 PATIENT SCREENING TOOL Asked and negative response unless otherwise noted:   Have you had symptoms of covid, tested positive or been in contact with someone with symptoms/positive test in the past 5-10 days?

## 2020-05-08 NOTE — Telephone Encounter (Signed)
(  6:15pm) Per request of Palliative Care NP-L. Rivers, SW completed a follow-up call to [name] to extend support and clarify primary caregivers/contact for patient. Joann Gutierrez thanked SW for the call. He was aware of NP visit today. He advised that his mother's partner had updated him on the visit today, which is okay. He advised that he is patient's only child, and he shares power of attorney with aunt/ [name] [phone]). SW asked if there were documents indicating they were his mother's power of attorney. He stated yes, he has a copy and patient's partner has a copy at their home. He requested that no information be shared with Joann Gutierrez, as she was not patient's biological daughter.  SW inquired as to how he would like to communicated. He advised that patient's partner is there in the home and will provide updates to him and his aunt. He request for more serious issues, he or his aunt should be contacted in addition to updating patient's partner.  SW provided contact information and encouraged him to call with any additional questions or concerns.  SW attempted to call [name], but was unable to reach her.  *NP updated for follow-up.

## 2020-05-11 ENCOUNTER — Emergency Department (HOSPITAL_COMMUNITY): Payer: Medicare HMO

## 2020-05-11 ENCOUNTER — Other Ambulatory Visit: Payer: Self-pay

## 2020-05-11 ENCOUNTER — Encounter (HOSPITAL_COMMUNITY): Payer: Self-pay

## 2020-05-11 ENCOUNTER — Emergency Department (HOSPITAL_COMMUNITY)
Admission: EM | Admit: 2020-05-11 | Discharge: 2020-05-11 | Disposition: A | Discharge status: Home / Self Care | Payer: Medicare HMO | Attending: Emergency Medicine | Admitting: Emergency Medicine

## 2020-05-11 DIAGNOSIS — E1122 Type 2 diabetes mellitus with diabetic chronic kidney disease: Secondary | ICD-10-CM | POA: Insufficient documentation

## 2020-05-11 DIAGNOSIS — Z8616 Personal history of COVID-19: Secondary | ICD-10-CM | POA: Insufficient documentation

## 2020-05-11 DIAGNOSIS — Z992 Dependence on renal dialysis: Secondary | ICD-10-CM | POA: Diagnosis not present

## 2020-05-11 DIAGNOSIS — J45909 Unspecified asthma, uncomplicated: Secondary | ICD-10-CM | POA: Diagnosis not present

## 2020-05-11 DIAGNOSIS — W19XXXA Unspecified fall, initial encounter: Secondary | ICD-10-CM | POA: Insufficient documentation

## 2020-05-11 DIAGNOSIS — N186 End stage renal disease: Secondary | ICD-10-CM | POA: Diagnosis not present

## 2020-05-11 DIAGNOSIS — Z96652 Presence of left artificial knee joint: Secondary | ICD-10-CM | POA: Diagnosis not present

## 2020-05-11 DIAGNOSIS — I5033 Acute on chronic diastolic (congestive) heart failure: Secondary | ICD-10-CM | POA: Diagnosis not present

## 2020-05-11 DIAGNOSIS — J449 Chronic obstructive pulmonary disease, unspecified: Secondary | ICD-10-CM | POA: Diagnosis not present

## 2020-05-11 DIAGNOSIS — I132 Hypertensive heart and chronic kidney disease with heart failure and with stage 5 chronic kidney disease, or end stage renal disease: Secondary | ICD-10-CM | POA: Diagnosis not present

## 2020-05-11 DIAGNOSIS — S0990XA Unspecified injury of head, initial encounter: Secondary | ICD-10-CM | POA: Insufficient documentation

## 2020-05-11 DIAGNOSIS — I2510 Atherosclerotic heart disease of native coronary artery without angina pectoris: Secondary | ICD-10-CM | POA: Diagnosis not present

## 2020-05-11 LAB — CBC WITH DIFFERENTIAL/PLATELET
Abs Immature Granulocytes: 0.07 10*3/uL (ref 0.00–0.07)
Basophils Absolute: 0 10*3/uL (ref 0.0–0.1)
Basophils Relative: 0 %
Eosinophils Absolute: 0 10*3/uL (ref 0.0–0.5)
Eosinophils Relative: 0 %
HCT: 35.7 % — ABNORMAL LOW (ref 36.0–46.0)
Hemoglobin: 10.7 g/dL — ABNORMAL LOW (ref 12.0–15.0)
Immature Granulocytes: 1 %
Lymphocytes Relative: 19 %
Lymphs Abs: 1.4 10*3/uL (ref 0.7–4.0)
MCH: 23.9 pg — ABNORMAL LOW (ref 26.0–34.0)
MCHC: 30 g/dL (ref 30.0–36.0)
MCV: 79.9 fL — ABNORMAL LOW (ref 80.0–100.0)
Monocytes Absolute: 1.1 10*3/uL — ABNORMAL HIGH (ref 0.1–1.0)
Monocytes Relative: 15 %
Neutro Abs: 4.5 10*3/uL (ref 1.7–7.7)
Neutrophils Relative %: 65 %
Platelets: 97 10*3/uL — ABNORMAL LOW (ref 150–400)
RBC: 4.47 MIL/uL (ref 3.87–5.11)
RDW: 18.2 % — ABNORMAL HIGH (ref 11.5–15.5)
WBC: 7 10*3/uL (ref 4.0–10.5)
nRBC: 0.6 % — ABNORMAL HIGH (ref 0.0–0.2)

## 2020-05-11 LAB — BASIC METABOLIC PANEL
Anion gap: 16 — ABNORMAL HIGH (ref 5–15)
BUN: 7 mg/dL — ABNORMAL LOW (ref 8–23)
CO2: 23 mmol/L (ref 22–32)
Calcium: 8.6 mg/dL — ABNORMAL LOW (ref 8.9–10.3)
Chloride: 97 mmol/L — ABNORMAL LOW (ref 98–111)
Creatinine, Ser: 4.28 mg/dL — ABNORMAL HIGH (ref 0.44–1.00)
GFR, Estimated: 10 mL/min — ABNORMAL LOW (ref >60)
Glucose, Bld: 179 mg/dL — ABNORMAL HIGH (ref 70–99)
Potassium: 3.7 mmol/L (ref 3.5–5.1)
Sodium: 136 mmol/L (ref 135–145)

## 2020-05-11 NOTE — Discharge Instructions (Signed)
Please return for any problem.  °

## 2020-05-11 NOTE — ED Notes (Signed)
Pt back from scans

## 2020-05-11 NOTE — ED Provider Notes (Signed)
[facility] EMERGENCY DEPARTMENT Provider Note   CSN: [id number] Arrival date & time: 05/11/20  1812     History Chief Complaint  Patient presents with  . Fall    Joann Gutierrez is a 75 y.o. female.  75 year old female with prior medical history as detailed below presents for evaluation.  Patient was on the way home from dialysis.  She was being transported by bus while in her wheelchair.  Apparently the patient fell from the wheelchair onto the floor the bus.  The patient was without specific complaint.  Patient was transported to the ED for evaluation.  Patient denies injury.  She denies pain.  She cannot be sure that she did not strike her head.  She denies LOC.  The history is provided by the patient and medical records.  Fall This is a new problem. The current episode started less than 1 hour ago. The problem occurs rarely. The problem has not changed since onset.Pertinent negatives include no chest pain, no abdominal pain, no headaches and no shortness of breath. Nothing aggravates the symptoms. Nothing relieves the symptoms.       Past Medical History:  Diagnosis Date  . Adenomatous colon polyp   . Allergy   . Anemia   . Asthma       . CAD (coronary artery disease)    Mild very minimal coronary disease with 20% obtuse marginal stenosis  . Carpal tunnel syndrome on left   . Cataract   . CHF (congestive heart failure) ([place])   . Chronic kidney disease (CKD), stage III (moderate) (HCC)    now stage 4  . COPD (chronic obstructive pulmonary disease) ([place])   . Dementia with behavioral disturbance ([place]) 03/09/2020  . Depression   . 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 Diverticulosis   . Dyspnea   . 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 Esophageal dysmotility   . Esophageal stricture   . ESRD on hemodialysis ([place]) 03/09/2020  . Gastritis   . Gastroparesis 08/21/2007  . GERD (gastroesophageal reflux disease)   . Hearing loss of both  ears   . Hernia, hiatal   . Hyperlipidemia   . Hypertension   . Morbid obesity ([place])   . OSTEOARTHRITIS 08/09/2006  . Osteoporosis   . Oxygen deficiency    prn   . 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 RENAL INSUFFICIENCY 02/16/2009  . 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 Seizures ([place])    pt thinks it has been several monthes since she had a seisure  . Shingles   . Sickle cell trait ([place])   . Sleep apnea    uses cpap  . Stroke [facility])     Patient Active Problem List   Diagnosis Date Noted  . Delirium 04/10/2020  . Adult failure to thrive 04/10/2020  . Acute bilat watershed infarction [facility]) 03/31/2020  . AMS (altered mental status) 03/29/2020  . Acute metabolic encephalopathy 35/36/1443  . ESRD on hemodialysis ([place]) 03/09/2020  . Thrombocytopenia ([place]) 03/09/2020  . Dementia with behavioral disturbance ([place]) 03/09/2020  . COVID-19   . SOB (shortness of breath)   . Type 2 diabetes mellitus with hyperlipidemia ([place])   . Sepsis ([place]) 02/16/2020  . Acute on chronic respiratory failure with hypoxia ([place]) 02/16/2020  . Edema of left lower leg 02/16/2020  . Pneumonia due to COVID-19 virus 02/15/2020  . Acute on chronic diastolic HF (heart failure) ([place]) 11/18/2019  . Dyslipidemia 11/18/2019  .  Polypharmacy 04/13/2019  . Healthcare maintenance 10/15/2017  . ARF (acute renal failure) ([place]) 09/07/2017  . Chest pain 09/05/2017  . Ineffective health maintenance 06/26/2017  . Medication management 06/26/2017  . Retinal edema 10/07/2016  . Postherpetic neuralgia 12/21/2015  . Herpes zoster with complication 82/99/3716  . Cellulitis of face 11/27/2015  . Anemia of chronic disease 08/17/2015  . History of colonic polyps   . Hypertensive emergency 05/20/2015  . Hypokalemia 05/20/2015  . AKI (acute kidney injury) ([place])   . Esophageal dysmotility 03/01/2015  . Throat congestion 10/11/2014  . Morbid obesity due to excess calories ([place]) 09/23/2014  . Acute asthmatic  bronchitis 05/20/2014  . Abdominal pain, other specified site 09/13/2013  . Elevated liver enzymes 09/13/2013  . Cough 05/05/2013  . Pain in lower limb 03/05/2013  . DOE (dyspnea on exertion) 02/03/2013  . Chronic respiratory failure ([place]) 12/13/2012  . Stricture and stenosis of esophagus 08/31/2012  . Onychomycosis 06/01/2012  . Pain in joint, ankle and foot 06/01/2012  . Diabetes mellitus type 2 in obese ([place]) 04/29/2012  . Essential hypertension 07/19/2011  . CKD (chronic kidney disease), stage III ([place]) 05/19/2011  . Family history of malignant neoplasm of gastrointestinal tract 10/30/2010  . Bloating 10/16/2010  . Epigastric pain 10/16/2010  . Foot pain 09/25/2010  . Dysphagia 07/16/2010  . GERD (gastroesophageal reflux disease) 07/16/2010  . Microcytic anemia 07/16/2010  . Obstructive sleep apnea 06/16/2009  . HYPERCHOLESTEROLEMIA 02/16/2009  . 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 Gastroparesis 08/21/2007  . Chronic obstructive asthma ([place]) 08/09/2006    Past Surgical History:  Procedure Laterality Date  . ABDOMINAL HYSTERECTOMY    . ARTERY BIOPSY  01/07/2011   Procedure: MINOR BIOPSY TEMPORAL ARTERY;  Surgeon: Haywood Lasso, MD;  Location: [facility];  Service: General;  Laterality: Left;  left temporal artery biopsy  . AV FISTULA PLACEMENT Right 11/16/2018   Procedure: CREATION RIGHT BRACHIOCEPHALIC FISTULA  ARTERIOVENOUS FISTULA;  Surgeon: Angelia Mould, MD;  Location: [facility];  Service: Vascular;  Laterality: Right;  . bil foot surgery    . BREAST LUMPECTOMY     benign  . BREAST LUMPECTOMY     both breast lumps removed   . CATARACT EXTRACTION Left 06/2016   Dr. Read Drivers  . COLONOSCOPY    . COLONOSCOPY WITH PROPOFOL N/A 07/05/2015   Procedure: COLONOSCOPY WITH PROPOFOL;  Surgeon: Manus Gunning, MD;  Location: [facility];  Service: Gastroenterology;  Laterality: N/A;  . COLONOSCOPY WITH PROPOFOL N/A 08/04/2018   Procedure:  COLONOSCOPY WITH PROPOFOL;  Surgeon: Yetta Flock, MD;  Location: [facility];  Service: Gastroenterology;  Laterality: N/A;  . ERD  08/08/2000  . ESOPHAGEAL MANOMETRY N/A 03/13/2015   Procedure: ESOPHAGEAL MANOMETRY (EM);  Surgeon: Manus Gunning, MD;  Location: [facility];  Service: Gastroenterology;  Laterality: N/A;  . ESOPHAGOGASTRODUODENOSCOPY  06/25/2006  . ESOPHAGOGASTRODUODENOSCOPY (EGD) WITH PROPOFOL N/A 07/05/2015   Procedure: ESOPHAGOGASTRODUODENOSCOPY (EGD) WITH PROPOFOL;  Surgeon: Manus Gunning, MD;  Location: [facility];  Service: Gastroenterology;  Laterality: N/A;  . FISTULA SUPERFICIALIZATION Right 11/16/2018   Procedure: Fistula Superficialization;  Surgeon: Angelia Mould, MD;  Location: [facility];  Service: Vascular;  Laterality: Right;  . HERNIA REPAIR    . IR PERC TUN PERIT CATH WO PORT S&I [facility]  02/17/2020  . IR US GUIDE VASC ACCESS RIGHT  02/18/2020  . LEFT AND RIGHT HEART CATHETERIZATION WITH CORONARY ANGIOGRAM N/A 03/03/2013   Procedure: LEFT AND RIGHT HEART CATHETERIZATION WITH  CORONARY ANGIOGRAM;  Surgeon: Minus Breeding, MD;  Location: [facility] CATH LAB;  Service: Cardiovascular;  Laterality: N/A;  . REPLACEMENT TOTAL KNEE Left 1998  . UPPER GASTROINTESTINAL ENDOSCOPY       OB History   No obstetric history on file.     Family History  Problem Relation Age of Onset  . Liver cancer Mother        Liver Cancer  . Diabetes Mother   . Kidney disease Mother   . Heart disease Mother        age 53's  . Heart disease Father 42       MI  . Heart attack Father        died of MI when pt was 41  . Sickle cell anemia Father   . Colon cancer Brother   . Cancer Brother        Colon Cancer  . Diabetes Sister   . Kidney disease Sister   . Heart disease Sister        age 19's  . Allergies Sister   . Diabetes Sister   . Kidney disease Sister   . Heart disease Sister        age 59's  . Esophageal cancer Neg Hx   . Rectal cancer Neg Hx    . Stomach cancer Neg Hx   . Amblyopia Neg Hx   . Blindness Neg Hx   . Glaucoma Neg Hx   . Macular degeneration Neg Hx   . Retinal detachment Neg Hx   . Cataracts Neg Hx   . Strabismus Neg Hx   . Retinitis pigmentosa Neg Hx     Social History   Tobacco Use  . Smoking status: Former Smoker    Packs/day: 0.50    Years: 10.00    Pack years: 5.00    Quit date: 04/18/1980    Years since quitting: 40.0  . Smokeless tobacco: Never Used  Vaping Use  . Vaping Use: Never used  Substance Use Topics  . Alcohol use: No    Alcohol/week: 0.0 standard drinks  . Drug use: No    Home Medications Prior to Admission medications   Medication Sig Start Date End Date Taking? Authorizing Provider  acetaminophen (TYLENOL) 325 MG tablet Take 650 mg by mouth every 4 (four) hours as needed for moderate pain.    [provider]  albuterol (VENTOLIN HFA) 108 (90 Base) MCG/ACT inhaler Inhale 2 puffs into the lungs every 4 (four) hours as needed for wheezing or shortness of breath. 04/08/20   Ghimire, Henreitta Leber, MD  ascorbic acid (VITAMIN C) 500 MG tablet Take 1 tablet (500 mg total) by mouth daily. Patient not taking: Reported on 05/08/2020 04/08/20   Jonetta Osgood, MD  aspirin EC 81 MG tablet Take 1 tablet (81 mg total) by mouth daily. For 2 weeks-along with Plavix-after 2 weeks-stop aspirin. 04/08/20   Ghimire, Henreitta Leber, MD  azelastine (ASTELIN) 0.1 % nasal spray Place 1 spray into both nostrils 2 (two) times daily as needed (allergies). Use in each nostril as directed 04/08/20   Ghimire, Henreitta Leber, MD  bisacodyl (DULCOLAX) 5 MG EC tablet Take 1 tablet (5 mg total) by mouth daily as needed for moderate constipation. 04/08/20   Ghimire, Henreitta Leber, MD  calcium carbonate (OSCAL) 1500 (600 Ca) MG TABS tablet Take 1 tablet (1,500 mg total) by mouth daily. 04/08/20   Ghimire, Henreitta Leber, MD  clopidogrel (PLAVIX) 75 MG tablet Take 1 tablet (75 mg  total) by mouth daily. 04/08/20   Ghimire, Henreitta Leber, MD   donepezil (ARICEPT) 5 MG tablet Take 1 tablet (5 mg total) by mouth at bedtime. 04/08/20   Ghimire, Henreitta Leber, MD  ezetimibe (ZETIA) 10 MG tablet Take 1 tablet (10 mg total) by mouth daily. 04/08/20   Ghimire, Henreitta Leber, MD  ferrous sulfate 325 (65 FE) MG tablet Take 1 tablet (325 mg total) by mouth daily. Patient not taking: Reported on 05/08/2020 04/08/20   Jonetta Osgood, MD  Fluticasone-Umeclidin-Vilant (TRELEGY ELLIPTA) 100-62.5-25 MCG/INH AEPB Take 1 puff by mouth daily. 04/08/20   Ghimire, Henreitta Leber, MD  guaiFENesin-dextromethorphan (ROBITUSSIN DM) 100-10 MG/5ML syrup Take 10 mLs by mouth every 4 (four) hours as needed for cough. 03/02/20   Sheikh, Omair Latif, DO  Ipratropium-Albuterol (COMBIVENT) 20-100 MCG/ACT AERS respimat Inhale 1 puff into the lungs every 6 (six) hours as needed for wheezing or shortness of breath. 04/08/20 05/08/20  Ghimire, Henreitta Leber, MD  lactose free nutrition (BOOST) LIQD Take 237 mLs by mouth in the morning and at bedtime.    [provider]  lip balm (CARMEX) ointment Apply topically as needed for lip care. 03/02/20   Sheikh, Omair Latif, DO  montelukast (SINGULAIR) 10 MG tablet Take 1 tablet (10 mg total) by mouth daily. Patient taking differently: Take 10 mg by mouth at bedtime as needed (breathing/allergies). 04/08/20   Ghimire, Henreitta Leber, MD  nitroGLYCERIN (NITROSTAT) 0.4 MG SL tablet Place 1 tablet (0.4 mg total) under the tongue every 5 (five) minutes as needed for chest pain. 04/08/20   Ghimire, Henreitta Leber, MD  OLANZapine zydis (ZYPREXA) 5 MG disintegrating tablet Take 1 tablet (5 mg total) by mouth at bedtime. 04/07/20   Ghimire, Henreitta Leber, MD  omeprazole (PRILOSEC) 40 MG capsule Take 1 capsule (40 mg total) by mouth in the morning and at bedtime. Please schedule a yearly follow up: [id number]. Thank you Patient not taking: Reported on 05/08/2020 04/08/20   Jonetta Osgood, MD  OXYGEN Inhale 2 L into the lungs as needed. CPAP with oxygen at bedtime     [provider]  polyethylene glycol (MIRALAX) 17 g packet Take 17 g by mouth daily as needed (constipation.). Patient not taking: Reported on 05/08/2020 04/08/20   Jonetta Osgood, MD  Propylene Glycol (SYSTANE BALANCE) 0.6 % SOLN Place 1 drop into both eyes daily as needed (dry eyes).    [provider]  Vitamin D3 (VITAMIN D) 25 MCG tablet Take 1 tablet (1,000 Units total) by mouth daily. 04/08/20   Ghimire, Henreitta Leber, MD    Allergies    Morphine and related and Promethazine hcl  Review of Systems   Review of Systems  Respiratory: Negative for shortness of breath.   Cardiovascular: Negative for chest pain.  Gastrointestinal: Negative for abdominal pain.  Neurological: Negative for headaches.  All other systems reviewed and are negative.   Physical Exam Updated Vital Signs BP (!) 100/54   Pulse 71   Temp (!) 97.4 F (36.3 C) (Oral)   Resp 14   SpO2 92%   Physical Exam Vitals and nursing note reviewed.  Constitutional:      General: She is not in acute distress.    Appearance: Normal appearance. She is well-developed.  HENT:     Head: Normocephalic and atraumatic.  Eyes:     Conjunctiva/sclera: Conjunctivae normal.     Pupils: Pupils are equal, round, and reactive to light.  Cardiovascular:     Rate and  Rhythm: Normal rate and regular rhythm.     Heart sounds: Normal heart sounds.  Pulmonary:     Effort: Pulmonary effort is normal. No respiratory distress.     Breath sounds: Normal breath sounds.  Abdominal:     General: There is no distension.     Palpations: Abdomen is soft.     Tenderness: There is no abdominal tenderness.  Musculoskeletal:        General: No deformity. Normal range of motion.     Cervical back: Normal range of motion and neck supple.  Skin:    General: Skin is warm and dry.  Neurological:     General: No focal deficit present.     Mental Status: She is alert and oriented to person, place, and time. Mental status is at  baseline.     ED Results / Procedures / Treatments   Labs (all labs ordered are listed, but only abnormal results are displayed) Labs Reviewed  BASIC METABOLIC PANEL - Abnormal; Notable for the following components:      Result Value   Chloride 97 (*)    Glucose, Bld 179 (*)    BUN 7 (*)    Creatinine, Ser 4.28 (*)    Calcium 8.6 (*)    GFR, Estimated 10 (*)    Anion gap 16 (*)    All other components within normal limits  CBC WITH DIFFERENTIAL/PLATELET - Abnormal; Notable for the following components:   Hemoglobin 10.7 (*)    HCT 35.7 (*)    MCV 79.9 (*)    MCH 23.9 (*)    RDW 18.2 (*)    Platelets 97 (*)    nRBC 0.6 (*)    Monocytes Absolute 1.1 (*)    All other components within normal limits    EKG EKG Interpretation  Date/Time:  Thursday May 11 2020 19:09:47 EDT Ventricular Rate:  85 PR Interval:  139 QRS Duration: 81 QT Interval:  389 QTC Calculation: 463 R Axis:   169 Text Interpretation: Sinus rhythm RVH with secondary repolarization abnrm Anterolateral infarct, age indeterminate Confirmed by Dene Gentry [id number]) on 05/11/2020 7:33:11 PM   Radiology DG Pelvis 1-2 Views  Result Date: 05/11/2020 CLINICAL DATA:  Post fall EXAM: PELVIS - 1-2 VIEW COMPARISON:  CT 09/05/2017 FINDINGS: SI joints are non widened. Pubic symphysis and rami appear intact. No fracture or malalignment. IMPRESSION: Negative. Electronically Signed   By: Donavan Foil M.D.   On: 05/11/2020 19:13   CT Head Wo Contrast  Result Date: 05/11/2020 CLINICAL DATA:  Fall, head trauma EXAM: CT HEAD WITHOUT CONTRAST TECHNIQUE: Contiguous axial images were obtained from the base of the skull through the vertex without intravenous contrast. COMPARISON:  04/10/2020 FINDINGS: Brain: There is atrophy and chronic small vessel disease changes. No acute intracranial abnormality. Specifically, no hemorrhage, hydrocephalus, mass lesion, acute infarction, or significant intracranial injury. Vascular: No  hyperdense vessel or unexpected calcification. Skull: No acute calvarial abnormality. Sinuses/Orbits: No acute findings Other: None IMPRESSION: Atrophy, chronic microvascular disease. No acute intracranial abnormality. Electronically Signed   By: Rolm Baptise M.D.   On: 05/11/2020 19:26   DG Chest Port 1 View  Result Date: 05/11/2020 CLINICAL DATA:  Post fall EXAM: PORTABLE CHEST 1 VIEW COMPARISON:  04/10/2020 FINDINGS: Right-sided central venous catheter tip overlies the right atrium. No focal opacity or pleural effusion. Stable cardiomediastinal silhouette with aortic atherosclerosis. No pneumothorax. IMPRESSION: No active disease. Electronically Signed   By: Donavan Foil M.D.   On: 05/11/2020 18:49  Procedures Procedures   Medications Ordered in ED Medications - No data to display  ED Course  I have reviewed the triage vital signs and the nursing notes.  Pertinent labs & imaging results that were available during my care of the patient were reviewed by me and considered in my medical decision making (see chart for details).    MDM Rules/Calculators/A&P                         MDM  MSE complete  Joann Gutierrez was evaluated in Emergency Department on 05/11/2020 for the symptoms described in the history of present illness. She was evaluated in the context of the global COVID-19 pandemic, which necessitated consideration that the patient might be at risk for infection with the SARS-CoV-2 virus that causes COVID-19. Institutional protocols and algorithms that pertain to the evaluation of patients at risk for COVID-19 are in a state of rapid change based on information released by regulatory bodies including the CDC and federal and state organizations. These policies and algorithms were followed during the patient's care in the ED.  Patient is presenting for evaluation after reported fall from her wheelchair.  Patient without evidence on exam of significant injury.  Patient  without significant complaint.  Screening imaging and labs are without significant abnormality.  Patient is at her baseline per family members who are present at the bedside in the ED.  Importance of close follow-up is stressed.  Strict return precautions given and understood.   Final Clinical Impression(s) / ED Diagnoses Final diagnoses:  Fall, initial encounter    Rx / DC Orders ED Discharge Orders    None       Valarie Merino, MD 05/11/20 2003

## 2020-05-11 NOTE — ED Notes (Signed)
Confirmed with patient and husband at bedside that pt no longer lives in facility and currently lives at home with husband. No care handoff documented.

## 2020-05-11 NOTE — ED Notes (Signed)
Pt to scans.

## 2020-05-11 NOTE — ED Triage Notes (Signed)
Per EMS: PT slipped out of her wheelchair on the bus coming home from dialysis. Driver called EMS for the fall. Firemen came for a lift help. PT complains of being weak. EMS found to be hypotensive and altered.

## 2020-05-15 ENCOUNTER — Inpatient Hospital Stay: Payer: Medicare Other | Admitting: Adult Health

## 2020-05-15 NOTE — Progress Notes (Deleted)
[facility] [address] [place]. [place] [address] [phone]       HOSPITAL FOLLOW UP NOTE  Ms. Beatriz Chancellor Date of Birth:  1945/08/08 Medical Record Number:  [id number]   Reason for Referral:  hospital stroke follow up    SUBJECTIVE:   CHIEF COMPLAINT:  No chief complaint on file.   HPI:   Ms. MERRIDITH DERSHEM is a 75 y.o. female with history of dementia; COPD on 2L home O2; CAD; ESRD on dialysis;OSA on CPAP; chronic diastolic CHF;DM; HTN; HLD; and obesity  who presented on 03/29/2020 with worsening of mental status on baseline advanced dementia.  Personally reviewed hospitalization pertinent progress notes, lab work and imaging with summary provided. She has baseline dementia but presented with worsening of speech and confusion and MRI scan shows multiple small subcortical lacunar strokes and a pseudowatershed distribution.  There are multiple uncontrolled risk factors of hypertension, diabetes and hyperlipidemia which could be contributing. Recommended DAPT for 3 weeks and Plavix alone with use of asa PTA.  HTN stable on amlodipine with long-term BP goal hypertensive range.  LDL 166 on Zetia and recommended initiation of atorvastatin 40 mg daily.  Uncontrolled DM with A1c 7.4 on aspart and glargine.  Other stroke risk factors include advanced age, former tobacco use, obesity, CAD, OSA on CPAP and CHF.  Other active problems include ESRD on HD and severe dementia.  Evaluated by therapies who recommended SNF however insurance denied therefore discharged home with maximum home health services on 04/08/2020.  She was also referred to palliative care services for outpatient follow-up   Stroke: Multifocal bilateral deep white matter infarcts left worse than Right in a pseudowatershed distribution likely related to severe small vessel disease uncontrolled risk factors of hypertension , diabetes and hyperlipidemia  CT head - Stable atrophy with periventricular  small vessel disease. Prior small infarct left cerebellum. No acute infarct. No mass or hemorrhage.  MRI - Multifocal acute ischemia in a deep watershed pattern, left worse than right. No hemorrhage or mass effect.  MRA  head -short segment moderate to severe stenosis of left PCA P2 segment and moderate to severe long segment stenosis of the distal left MCA M1  Carotid Doppler - Left and Right ICA consistent with 1-39% stenosis.  2D Echo- EF:70-75%. Left ventricle has hyperdynamic function. No regional wall motion abnormalities.  LDL 166  HgbA1c 7.4  VTE prophylaxis - Heparin  aspirin 81 mg daily prior to admission, now on aspirin 81 mg daily and Plavix 75 mg daily for 3 weeks followed by Plavix alone.   Therapy recommendations:  SNF recommended although insurance denied - HH PT/OT/SLP  Disposition:  home  Today, 05/15/2020, Mrs. Dayrit is being seen for hospital follow-up  She has been stable from a stroke standpoint without new stroke/TIA symptoms She has had multiple ED evaluations since discharge personally reviewed - 3/21 and 4/2 for hypotension stabilized prior to discharge and 4/21 for fall from w/c without any acute injury identified         ROS:   14 system review of systems performed and negative with exception of ***  PMH:  Past Medical History:  Diagnosis Date  . Adenomatous colon polyp   . Allergy   . Anemia   . Asthma       . CAD (coronary artery disease)    Mild very minimal coronary disease with 20% obtuse marginal stenosis  . Carpal tunnel syndrome on left   . Cataract   .  CHF (congestive heart failure) ([place])   . Chronic kidney disease (CKD), stage III (moderate) (HCC)    now stage 4  . COPD (chronic obstructive pulmonary disease) ([place])   . Dementia with behavioral disturbance ([place]) 03/09/2020  . Depression   . 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 Diverticulosis   . Dyspnea   . 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 Esophageal  dysmotility   . Esophageal stricture   . ESRD on hemodialysis ([place]) 03/09/2020  . Gastritis   . Gastroparesis 08/21/2007  . GERD (gastroesophageal reflux disease)   . Hearing loss of both ears   . Hernia, hiatal   . Hyperlipidemia   . Hypertension   . Morbid obesity ([place])   . OSTEOARTHRITIS 08/09/2006  . Osteoporosis   . Oxygen deficiency    prn   . 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 RENAL INSUFFICIENCY 02/16/2009  . 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 Seizures ([place])    pt thinks it has been several monthes since she had a seisure  . Shingles   . Sickle cell trait ([place])   . Sleep apnea    uses cpap  . Stroke [facility])     PSH:  Past Surgical History:  Procedure Laterality Date  . ABDOMINAL HYSTERECTOMY    . ARTERY BIOPSY  01/07/2011   Procedure: MINOR BIOPSY TEMPORAL ARTERY;  Surgeon: Haywood Lasso, MD;  Location: [place];  Service: General;  Laterality: Left;  left temporal artery biopsy  . AV FISTULA PLACEMENT Right 11/16/2018   Procedure: CREATION RIGHT BRACHIOCEPHALIC FISTULA  ARTERIOVENOUS FISTULA;  Surgeon: Angelia Mould, MD;  Location: [place];  Service: Vascular;  Laterality: Right;  . bil foot surgery    . BREAST LUMPECTOMY     benign  . BREAST LUMPECTOMY     both breast lumps removed   . CATARACT EXTRACTION Left 06/2016   Dr. Read Drivers  . COLONOSCOPY    . COLONOSCOPY WITH PROPOFOL N/A 07/05/2015   Procedure: COLONOSCOPY WITH PROPOFOL;  Surgeon: Manus Gunning, MD;  Location: [facility];  Service: Gastroenterology;  Laterality: N/A;  . COLONOSCOPY WITH PROPOFOL N/A 08/04/2018   Procedure: COLONOSCOPY WITH PROPOFOL;  Surgeon: Yetta Flock, MD;  Location: [facility];  Service: Gastroenterology;  Laterality: N/A;  . ERD  08/08/2000  . ESOPHAGEAL MANOMETRY N/A 03/13/2015   Procedure: ESOPHAGEAL MANOMETRY (EM);  Surgeon: Manus Gunning, MD;  Location: [facility];  Service: Gastroenterology;   Laterality: N/A;  . ESOPHAGOGASTRODUODENOSCOPY  06/25/2006  . ESOPHAGOGASTRODUODENOSCOPY (EGD) WITH PROPOFOL N/A 07/05/2015   Procedure: ESOPHAGOGASTRODUODENOSCOPY (EGD) WITH PROPOFOL;  Surgeon: Manus Gunning, MD;  Location: [facility];  Service: Gastroenterology;  Laterality: N/A;  . FISTULA SUPERFICIALIZATION Right 11/16/2018   Procedure: Fistula Superficialization;  Surgeon: Angelia Mould, MD;  Location: [place];  Service: Vascular;  Laterality: Right;  . HERNIA REPAIR    . IR PERC TUN PERIT CATH WO PORT S&I [facility]  02/17/2020  . IR US GUIDE VASC ACCESS RIGHT  02/18/2020  . LEFT AND RIGHT HEART CATHETERIZATION WITH CORONARY ANGIOGRAM N/A 03/03/2013   Procedure: LEFT AND RIGHT HEART CATHETERIZATION WITH CORONARY ANGIOGRAM;  Surgeon: Minus Breeding, MD;  Location: [facility];  Service: Cardiovascular;  Laterality: N/A;  . REPLACEMENT TOTAL KNEE Left 1998  . UPPER GASTROINTESTINAL ENDOSCOPY      Social History:  Social History   Socioeconomic History  . Marital status: Widowed    Spouse name:  Not on file  . Number of children: 2  . Years of education: Not on file  . Highest education level: Not on file  Occupational History  . Occupation: Retired    Fish farm manager: UNEMPLOYED  Tobacco Use  . Smoking status: Former Smoker    Packs/day: 0.50    Years: 10.00    Pack years: 5.00    Quit date: 04/18/1980    Years since quitting: 40.1  . Smokeless tobacco: Never Used  Vaping Use  . Vaping Use: Never used  Substance and Sexual Activity  . Alcohol use: No    Alcohol/week: 0.0 standard drinks  . Drug use: No  . Sexual activity: Not Currently    Birth control/protection: Post-menopausal    Comment: Hysterectomy  Other Topics Concern  . Not on file  Social History Narrative   Previously worked as a Electrical engineer.   Daily Caffeine Use-Coffee and Tea   Lives with a friend who is her care giver, has home health nurse come out once a week.  She has family in town- daughter,  grand daughter.    Social Determinants of Health   Financial Resource Strain: Not on file  Food Insecurity: Not on file  Transportation Needs: Not on file  Physical Activity: Not on file  Stress: Not on file  Social Connections: Not on file  Intimate Partner Violence: Not on file    Family History:  Family History  Problem Relation Age of Onset  . Liver cancer Mother        Liver Cancer  . Diabetes Mother   . Kidney disease Mother   . Heart disease Mother        age 25's  . Heart disease Father 91       MI  . Heart attack Father        died of MI when pt was 60  . Sickle cell anemia Father   . Colon cancer Brother   . Cancer Brother        Colon Cancer  . Diabetes Sister   . Kidney disease Sister   . Heart disease Sister        age 69's  . Allergies Sister   . Diabetes Sister   . Kidney disease Sister   . Heart disease Sister        age 23's  . Esophageal cancer Neg Hx   . Rectal cancer Neg Hx   . Stomach cancer Neg Hx   . Amblyopia Neg Hx   . Blindness Neg Hx   . Glaucoma Neg Hx   . Macular degeneration Neg Hx   . Retinal detachment Neg Hx   . Cataracts Neg Hx   . Strabismus Neg Hx   . Retinitis pigmentosa Neg Hx     Medications:   Current Outpatient Medications on File Prior to Visit  Medication Sig Dispense Refill  . acetaminophen (TYLENOL) 325 MG tablet Take 650 mg by mouth every 4 (four) hours as needed for moderate pain.    Marland Kitchen albuterol (VENTOLIN HFA) 108 (90 Base) MCG/ACT inhaler Inhale 2 puffs into the lungs every 4 (four) hours as needed for wheezing or shortness of breath. 18 g 6  . ascorbic acid (VITAMIN C) 500 MG tablet Take 1 tablet (500 mg total) by mouth daily. (Patient not taking: Reported on 05/08/2020) 30 tablet 0  . aspirin EC 81 MG tablet Take 1 tablet (81 mg total) by mouth daily. For 2 weeks-along with Plavix-after 2 weeks-stop aspirin.  30 tablet 11  . azelastine (ASTELIN) 0.1 % nasal spray Place 1 spray into both nostrils 2 (two) times  daily as needed (allergies). Use in each nostril as directed 30 mL 0  . bisacodyl (DULCOLAX) 5 MG EC tablet Take 1 tablet (5 mg total) by mouth daily as needed for moderate constipation. 30 tablet 0  . calcium carbonate (OSCAL) 1500 (600 Ca) MG TABS tablet Take 1 tablet (1,500 mg total) by mouth daily. 30 tablet 0  . clopidogrel (PLAVIX) 75 MG tablet Take 1 tablet (75 mg total) by mouth daily. 30 tablet 0  . donepezil (ARICEPT) 5 MG tablet Take 1 tablet (5 mg total) by mouth at bedtime. 30 tablet 0  . ezetimibe (ZETIA) 10 MG tablet Take 1 tablet (10 mg total) by mouth daily. 30 tablet 0  . ferrous sulfate 325 (65 FE) MG tablet Take 1 tablet (325 mg total) by mouth daily. (Patient not taking: Reported on 05/08/2020) 30 tablet 0  . Fluticasone-Umeclidin-Vilant (TRELEGY ELLIPTA) 100-62.5-25 MCG/INH AEPB Take 1 puff by mouth daily. 60 each 0  . guaiFENesin-dextromethorphan (ROBITUSSIN DM) 100-10 MG/5ML syrup Take 10 mLs by mouth every 4 (four) hours as needed for cough. 118 mL 0  . Ipratropium-Albuterol (COMBIVENT) 20-100 MCG/ACT AERS respimat Inhale 1 puff into the lungs every 6 (six) hours as needed for wheezing or shortness of breath. 4 g 0  . lactose free nutrition (BOOST) LIQD Take 237 mLs by mouth in the morning and at bedtime.    . lip balm (CARMEX) ointment Apply topically as needed for lip care. 7 g 0  . montelukast (SINGULAIR) 10 MG tablet Take 1 tablet (10 mg total) by mouth daily. (Patient taking differently: Take 10 mg by mouth at bedtime as needed (breathing/allergies).) 30 tablet 3  . nitroGLYCERIN (NITROSTAT) 0.4 MG SL tablet Place 1 tablet (0.4 mg total) under the tongue every 5 (five) minutes as needed for chest pain. 30 tablet 0  . OLANZapine zydis (ZYPREXA) 5 MG disintegrating tablet Take 1 tablet (5 mg total) by mouth at bedtime.    Marland Kitchen omeprazole (PRILOSEC) 40 MG capsule Take 1 capsule (40 mg total) by mouth in the morning and at bedtime. Please schedule a yearly follow up: [phone].  Thank you (Patient not taking: Reported on 05/08/2020) 60 capsule 0  . OXYGEN Inhale 2 L into the lungs as needed. CPAP with oxygen at bedtime    . polyethylene glycol (MIRALAX) 17 g packet Take 17 g by mouth daily as needed (constipation.). (Patient not taking: Reported on 05/08/2020)    . Propylene Glycol (SYSTANE BALANCE) 0.6 % SOLN Place 1 drop into both eyes daily as needed (dry eyes).    . Vitamin D3 (VITAMIN D) 25 MCG tablet Take 1 tablet (1,000 Units total) by mouth daily. 30 tablet 0   No current facility-administered medications on file prior to visit.    Allergies:   Allergies  Allergen Reactions  . Morphine And Related Other (See Comments)    Family request not to be given, reports pt does not wake up when given   . Promethazine Hcl Other (See Comments)    REACTION: lethargy      OBJECTIVE:  Physical Exam  There were no vitals filed for this visit. There is no height or weight on file to calculate BMI. No exam data present  Depression screen [facility] 2/9 09/16/2018  Decreased Interest 0  Down, Depressed, Hopeless 0  PHQ - 2 Score 0  Some recent data might be  hidden     General: well developed, well nourished, seated, in no evident distress Head: head normocephalic and atraumatic.   Neck: supple with no carotid or supraclavicular bruits Cardiovascular: regular rate and rhythm, no murmurs Musculoskeletal: no deformity Skin:  no rash/petichiae Vascular:  Normal pulses all extremities   Neurologic Exam Mental Status: Awake and fully alert. Oriented to place and time. Recent and remote memory intact. Attention span, concentration and fund of knowledge appropriate. Mood and affect appropriate.  Cranial Nerves: Fundoscopic exam reveals sharp disc margins. Pupils equal, briskly reactive to light. Extraocular movements full without nystagmus. Visual fields full to confrontation. Hearing intact. Facial sensation intact. Face, tongue, palate moves normally and symmetrically.   Motor: Normal bulk and tone. Normal strength in all tested extremity muscles Sensory.: intact to touch , pinprick , position and vibratory sensation.  Coordination: Rapid alternating movements normal in all extremities. Finger-to-nose and heel-to-shin performed accurately bilaterally. Gait and Station: Arises from chair without difficulty. Stance is normal. Gait demonstrates normal stride length and balance with ***. Tandem walk and heel toe ***.  Reflexes: 1+ and symmetric. Toes downgoing.     NIHSS  *** Modified Rankin  ***      ASSESSMENT: EDMUND RICK is a 75 y.o. year old female presented with worsening of speech and confusion with baseline on 03/29/2020 with MRI showing multiple small subcortical lacunar infarcts and a pseduowatershed distribution likely secondary to small vessel disease with multiple uncontrolled risk factors including HTN, HLD and DM. Vascular risk factors include baseline severe dementia, HTN, HLD, DM, advanced age, former tobacco use, obesity, ESRD on HD, CAD, OSA on CPAP and CHF.      PLAN:  1. Ischemic stroke as above: Residual deficit: ***. Continue clopidogrel 75 mg daily  and atorvastatin 40 mg daily for secondary stroke prevention.  Discussed secondary stroke prevention measures and importance of close PCP follow up for aggressive stroke risk factor management  2. HTN: BP goal <130/90.  Stable on *** per PCP 3. HLD: LDL goal <70. Recent LDL 166 on zetia therefore atorvastatin 40 mg daily added during recent stroke admission.  4. DMII: A1c goal<7.0. Recent A1c 7.4.     Follow up in *** or call earlier if needed   CC:  [facility] provider: Dr. Leonie Man PCP: Andree Moro, DO    I spent *** minutes of face-to-face and non-face-to-face time with patient.  This included previsit chart review, lab review, study review, order entry, electronic health record documentation, patient education regarding recent stroke, residual deficits, importance of managing  stroke risk factors and answered all questions to patient satisfaction     Frann Rider, [facility]  [facility] [address] [place], [place] [address]  Phone [phone] Fax [phone] Note: This document was prepared with digital dictation and possible smart phrase technology. Any transcriptional errors that result from this process are unintentional.

## 2020-06-12 IMAGING — US US ABDOMEN COMPLETE
1 series · 13 of 25 positions shown · non-contrast
Comparison: 09/07/2017, 09/05/2017

CLINICAL DATA: Chronic kidney disease

EXAM:
ABDOMEN ULTRASOUND COMPLETE

[Series 1: us abdomen complete · 0.17mm/px · 13 of 101 slices shown]
[im 1/101]
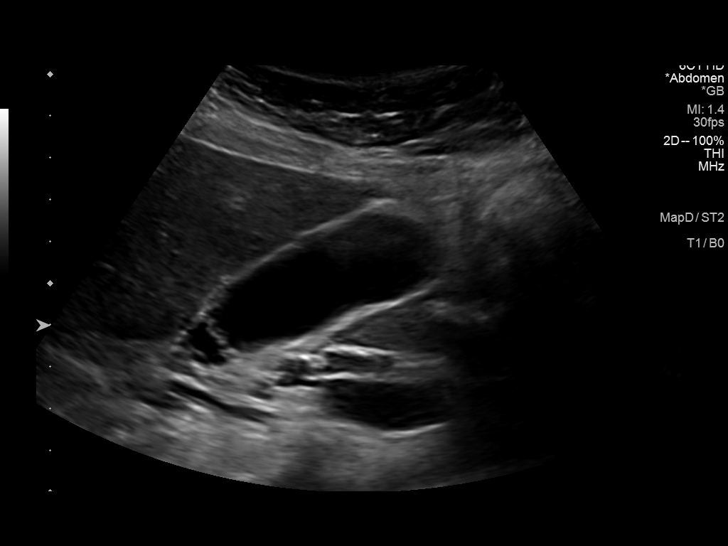
[im 9/101]
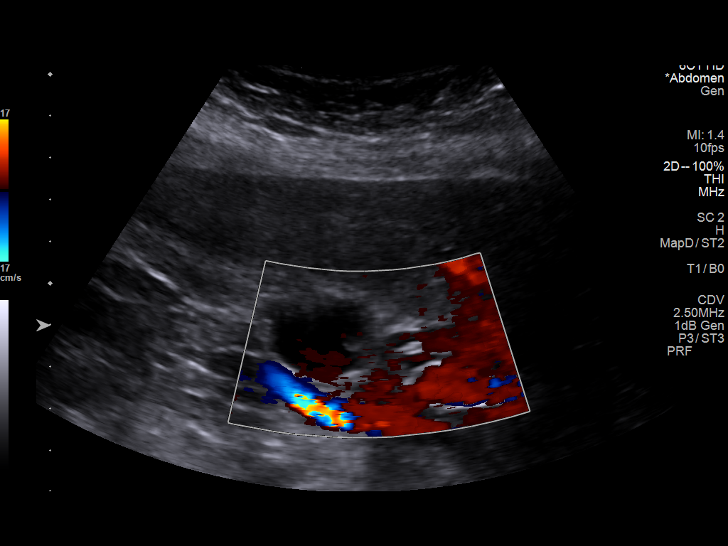
[im 17/101]
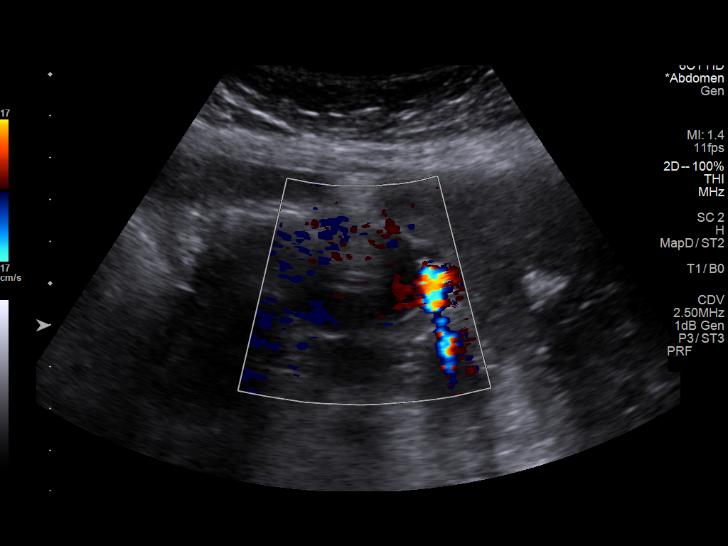
[im 26/101]
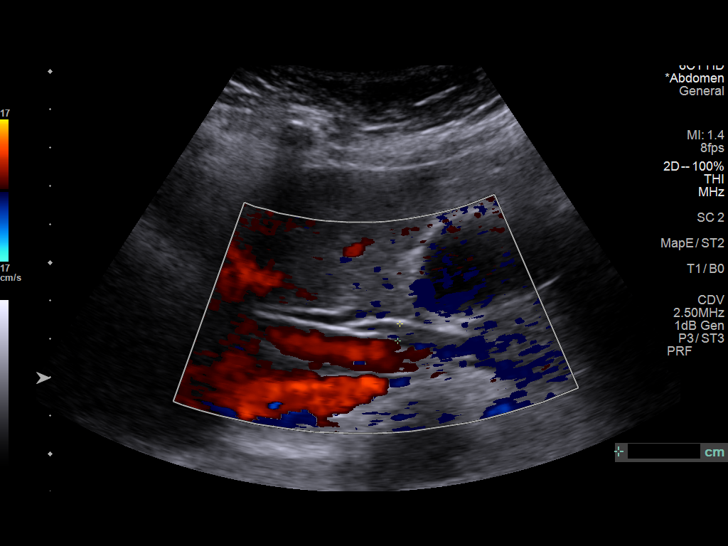
[im 34/101]
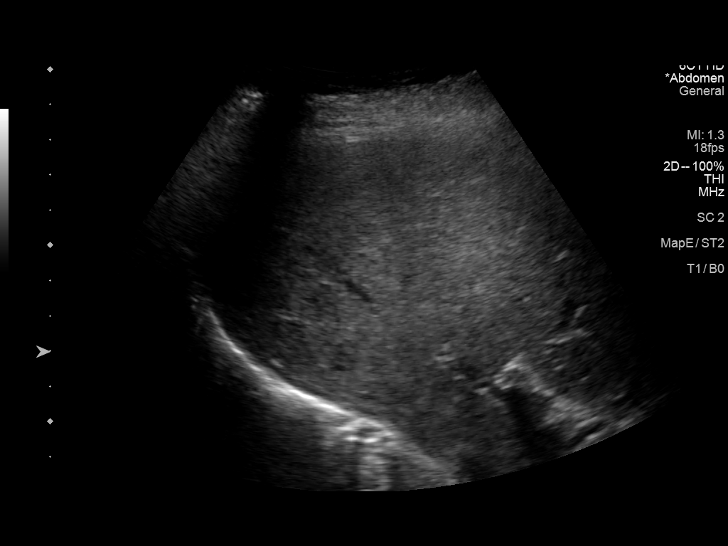
[im 42/101]
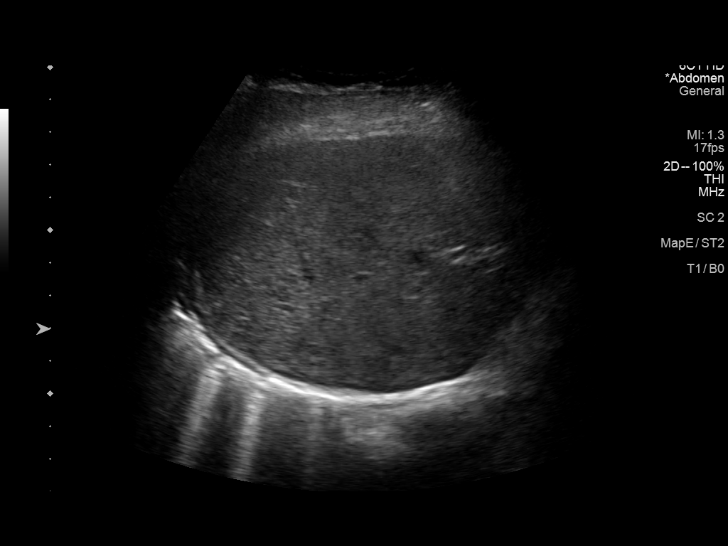
[im 51/101]
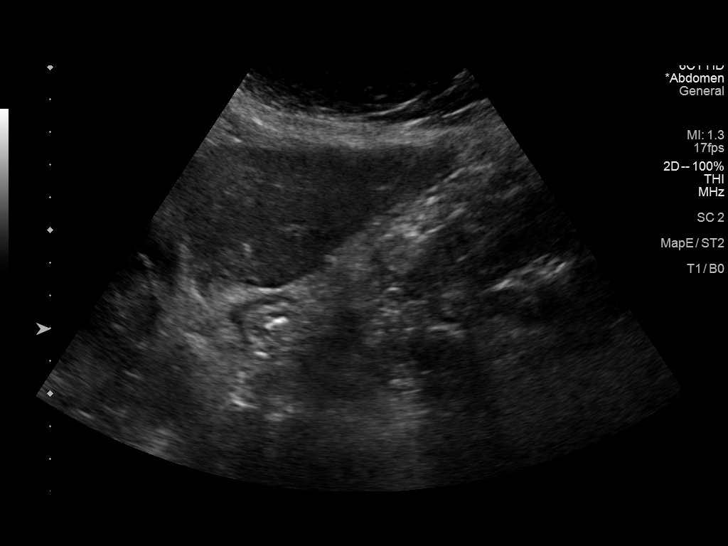
[im 59/101]
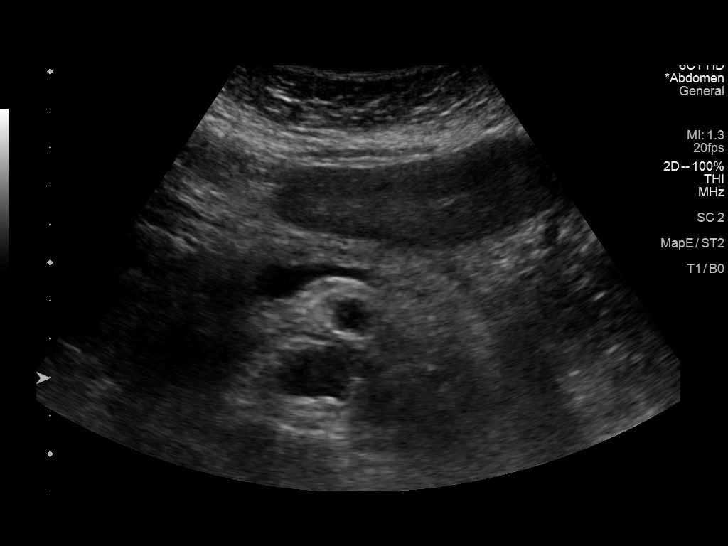
[im 67/101]
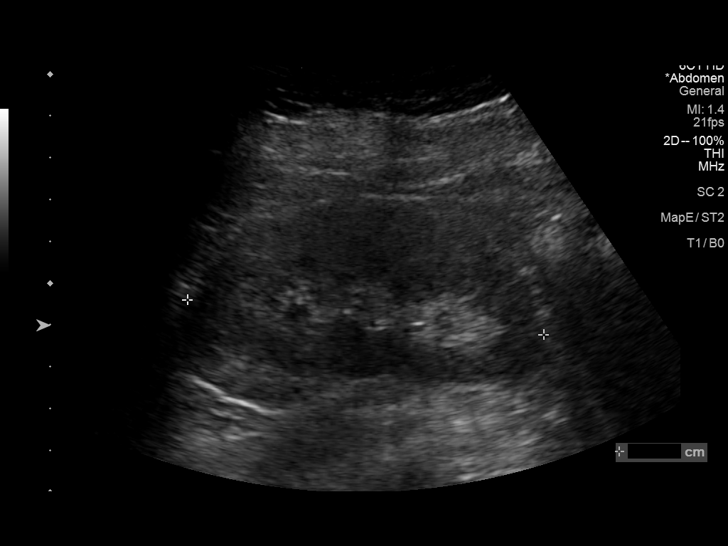
[im 76/101]
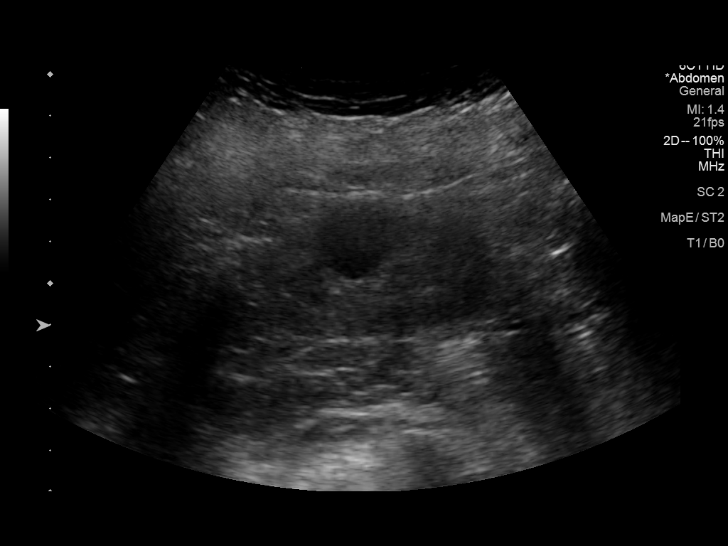
[im 84/101]
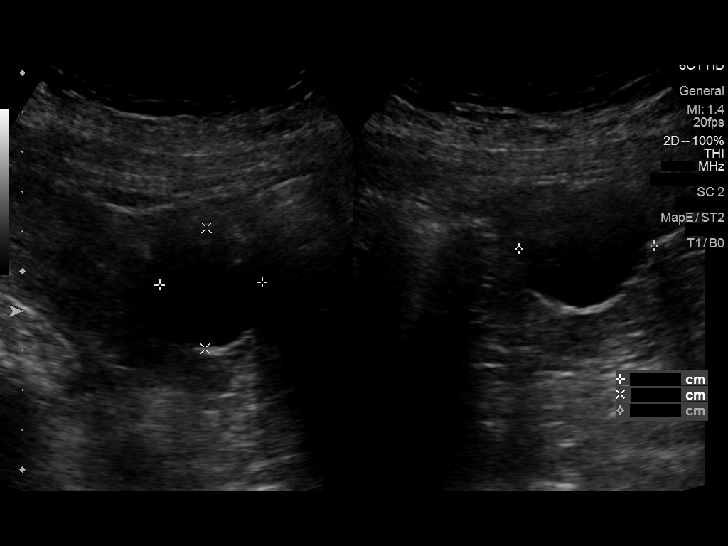
[im 92/101]
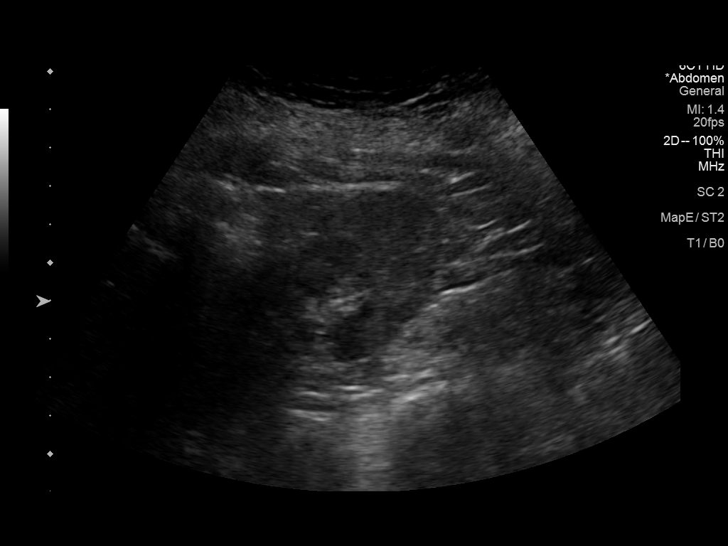
[im 101/101]
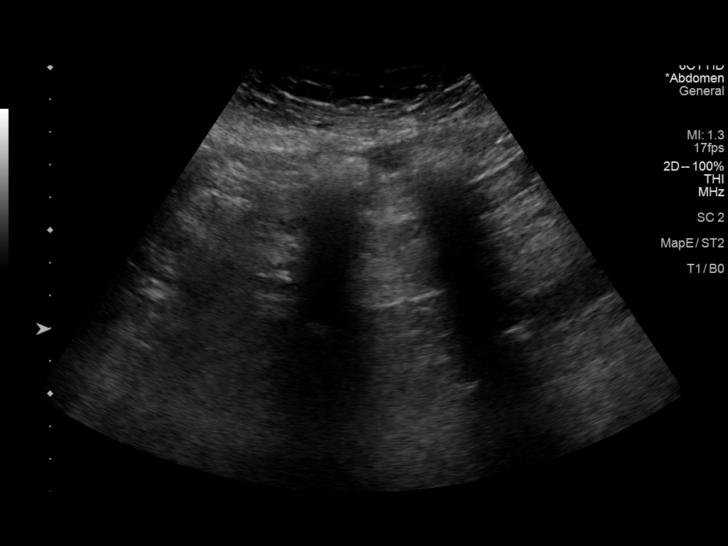

[13 of 25 positions shown; findings below may reference images not displayed]

FINDINGS: Gallbladder: 6 mm stone at the gallbladder fundus which appears
nonmobile. No wall thickening visualized. No sonographic Murphy sign
noted by sonographer.

Common bile duct: Diameter: 5 mm

Liver: No focal lesion identified. Mildly heterogeneous hepatic
parenchyma without increased parenchymal echogenicity. Portal vein
is patent on color Doppler imaging with normal direction of blood
flow towards the liver.

IVC: No abnormality visualized.

Pancreas: Visualized portion unremarkable.

Spleen: Size and appearance within normal limits.

Right Kidney: Length: 8.6 cm. Increased renal parenchymal
echogenicity. 1.8 cm midpole renal cyst. No solid mass or
hydronephrosis visualized.

Left Kidney: Length: 9.4 cm. Increased renal parenchymal
echogenicity. Exophytic 3.4 cm inferior pole left renal cyst. No
solid mass or hydronephrosis visualized.

Abdominal aorta: No aneurysm visualized.

Other findings: None.
IMPRESSION: 1. No obstructive uropathy.
2. Findings of chronic medical renal disease.
3. Non mobile 6 mm calculus at the gallbladder fundus. No secondary
sonographic findings to suggest cholecystitis.
4. Mildly heterogeneous hepatic parenchyma, nonspecific. Correlate
with liver function tests.

## 2020-06-15 ENCOUNTER — Encounter (HOSPITAL_COMMUNITY): Payer: Self-pay

## 2020-06-15 ENCOUNTER — Emergency Department (HOSPITAL_COMMUNITY)
Admission: EM | Admit: 2020-06-15 | Discharge: 2020-06-16 | Disposition: A | Discharge status: Home / Self Care | Payer: Medicare HMO | Attending: Emergency Medicine | Admitting: Emergency Medicine

## 2020-06-15 ENCOUNTER — Other Ambulatory Visit: Payer: Self-pay

## 2020-06-15 ENCOUNTER — Emergency Department (HOSPITAL_COMMUNITY): Payer: Medicare HMO

## 2020-06-15 DIAGNOSIS — Z79899 Other long term (current) drug therapy: Secondary | ICD-10-CM | POA: Insufficient documentation

## 2020-06-15 DIAGNOSIS — I5033 Acute on chronic diastolic (congestive) heart failure: Secondary | ICD-10-CM | POA: Insufficient documentation

## 2020-06-15 DIAGNOSIS — Z7902 Long term (current) use of antithrombotics/antiplatelets: Secondary | ICD-10-CM | POA: Diagnosis not present

## 2020-06-15 DIAGNOSIS — R0602 Shortness of breath: Secondary | ICD-10-CM | POA: Diagnosis not present

## 2020-06-15 DIAGNOSIS — I132 Hypertensive heart and chronic kidney disease with heart failure and with stage 5 chronic kidney disease, or end stage renal disease: Secondary | ICD-10-CM | POA: Diagnosis not present

## 2020-06-15 DIAGNOSIS — E1122 Type 2 diabetes mellitus with diabetic chronic kidney disease: Secondary | ICD-10-CM | POA: Diagnosis not present

## 2020-06-15 DIAGNOSIS — J449 Chronic obstructive pulmonary disease, unspecified: Secondary | ICD-10-CM | POA: Insufficient documentation

## 2020-06-15 DIAGNOSIS — Z87891 Personal history of nicotine dependence: Secondary | ICD-10-CM | POA: Insufficient documentation

## 2020-06-15 DIAGNOSIS — Z96652 Presence of left artificial knee joint: Secondary | ICD-10-CM | POA: Insufficient documentation

## 2020-06-15 DIAGNOSIS — M79604 Pain in right leg: Secondary | ICD-10-CM | POA: Diagnosis present

## 2020-06-15 DIAGNOSIS — Z992 Dependence on renal dialysis: Secondary | ICD-10-CM | POA: Insufficient documentation

## 2020-06-15 DIAGNOSIS — J45909 Unspecified asthma, uncomplicated: Secondary | ICD-10-CM | POA: Insufficient documentation

## 2020-06-15 DIAGNOSIS — Z7982 Long term (current) use of aspirin: Secondary | ICD-10-CM | POA: Insufficient documentation

## 2020-06-15 DIAGNOSIS — M25461 Effusion, right knee: Secondary | ICD-10-CM

## 2020-06-15 DIAGNOSIS — N186 End stage renal disease: Secondary | ICD-10-CM | POA: Insufficient documentation

## 2020-06-15 DIAGNOSIS — I2510 Atherosclerotic heart disease of native coronary artery without angina pectoris: Secondary | ICD-10-CM | POA: Insufficient documentation

## 2020-06-15 DIAGNOSIS — Z8616 Personal history of COVID-19: Secondary | ICD-10-CM | POA: Insufficient documentation

## 2020-06-15 LAB — COMPREHENSIVE METABOLIC PANEL
ALT: 10 U/L (ref 0–44)
AST: 15 U/L (ref 15–41)
Albumin: 2.9 g/dL — ABNORMAL LOW (ref 3.5–5.0)
Alkaline Phosphatase: 64 U/L (ref 38–126)
Anion gap: 12 (ref 5–15)
BUN: 23 mg/dL (ref 8–23)
CO2: 27 mmol/L (ref 22–32)
Calcium: 8.8 mg/dL — ABNORMAL LOW (ref 8.9–10.3)
Chloride: 102 mmol/L (ref 98–111)
Creatinine, Ser: 7.23 mg/dL — ABNORMAL HIGH (ref 0.44–1.00)
GFR, Estimated: 5 mL/min — ABNORMAL LOW (ref >60)
Glucose, Bld: 135 mg/dL — ABNORMAL HIGH (ref 70–99)
Potassium: 4.8 mmol/L (ref 3.5–5.1)
Sodium: 141 mmol/L (ref 135–145)
Total Bilirubin: 0.6 mg/dL (ref 0.3–1.2)
Total Protein: 6.1 g/dL — ABNORMAL LOW (ref 6.5–8.1)

## 2020-06-15 LAB — CBC WITH DIFFERENTIAL/PLATELET
Abs Immature Granulocytes: 0.12 10*3/uL — ABNORMAL HIGH (ref 0.00–0.07)
Basophils Absolute: 0 10*3/uL (ref 0.0–0.1)
Basophils Relative: 0 %
Eosinophils Absolute: 0.1 10*3/uL (ref 0.0–0.5)
Eosinophils Relative: 1 %
HCT: 31 % — ABNORMAL LOW (ref 36.0–46.0)
Hemoglobin: 9.6 g/dL — ABNORMAL LOW (ref 12.0–15.0)
Immature Granulocytes: 2 %
Lymphocytes Relative: 30 %
Lymphs Abs: 2.1 10*3/uL (ref 0.7–4.0)
MCH: 23.9 pg — ABNORMAL LOW (ref 26.0–34.0)
MCHC: 31 g/dL (ref 30.0–36.0)
MCV: 77.3 fL — ABNORMAL LOW (ref 80.0–100.0)
Monocytes Absolute: 1.5 10*3/uL — ABNORMAL HIGH (ref 0.1–1.0)
Monocytes Relative: 21 %
Neutro Abs: 3.1 10*3/uL (ref 1.7–7.7)
Neutrophils Relative %: 46 %
Platelets: 148 10*3/uL — ABNORMAL LOW (ref 150–400)
RBC: 4.01 MIL/uL (ref 3.87–5.11)
RDW: 19.5 % — ABNORMAL HIGH (ref 11.5–15.5)
WBC: 6.8 10*3/uL (ref 4.0–10.5)
nRBC: 0 % (ref 0.0–0.2)

## 2020-06-15 MED ORDER — OXYCODONE HCL 5 MG PO TABS
5.0000 mg | ORAL_TABLET | Freq: Once | ORAL | Status: AC
  Administered 2020-06-15: 5 mg via ORAL
  Filled 2020-06-15: qty 1

## 2020-06-15 NOTE — ED Provider Notes (Signed)
[facility] EMERGENCY DEPARTMENT Provider Note   CSN: [id number] Arrival date & time: 06/15/20  1938     History Chief Complaint  Patient presents with  . Leg Pain    Joann Gutierrez is a 75 y.o. female.  75 y.o female with a PMH of CAD, CHF, ESRD (THS) last dialyzed Tuesday presents to the ED via EMS with a chief complaint of right leg pain.  Patient is currently mostly bedbound, according to sister at the bedside, patient does not walk around, only transfers to stretcher and to dialysis.  She has been complaining of right leg pain, states that she is unable to put any weight on her.  Does note swelling to the right knee, did apply some cream today to help with relief without much improvement.  She has not taken any oral medication for improvement in her symptoms.  She did not witness any falls, patient has not been running any fevers.  Patient was supposed to have dialysis this morning but missed the session due to ongoing right leg pain. She is also endorses some shortness of breath but no chest pain.  Denies any fever, trauma, other complaints.  The history is provided by the patient.       Past Medical History:  Diagnosis Date  . Adenomatous colon polyp   . Allergy   . Anemia   . Asthma       . CAD (coronary artery disease)    Mild very minimal coronary disease with 20% obtuse marginal stenosis  . Carpal tunnel syndrome on left   . Cataract   . CHF (congestive heart failure) ([place])   . Chronic kidney disease (CKD), stage III (moderate) (HCC)    now stage 4  . COPD (chronic obstructive pulmonary disease) ([place])   . Dementia with behavioral disturbance ([place]) 03/09/2020  . Depression   . 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 Diverticulosis   . Dyspnea   . 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 Esophageal dysmotility   . Esophageal stricture   . ESRD on hemodialysis ([place]) 03/09/2020  . Gastritis   . Gastroparesis 08/21/2007  . GERD  (gastroesophageal reflux disease)   . Hearing loss of both ears   . Hernia, hiatal   . Hyperlipidemia   . Hypertension   . Morbid obesity ([place])   . OSTEOARTHRITIS 08/09/2006  . Osteoporosis   . Oxygen deficiency    prn   . 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 RENAL INSUFFICIENCY 02/16/2009  . 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 Seizures ([place])    pt thinks it has been several monthes since she had a seisure  . Shingles   . Sickle cell trait ([place])   . Sleep apnea    uses cpap  . Stroke [facility])     Patient Active Problem List   Diagnosis Date Noted  . Delirium 04/10/2020  . Adult failure to thrive 04/10/2020  . Acute bilat watershed infarction [facility]) 03/31/2020  . AMS (altered mental status) 03/29/2020  . Acute metabolic encephalopathy 44/81/8563  . ESRD on hemodialysis ([place]) 03/09/2020  . Thrombocytopenia ([place]) 03/09/2020  . Dementia with behavioral disturbance ([place]) 03/09/2020  . COVID-19   . SOB (shortness of breath)   . Type 2 diabetes mellitus with hyperlipidemia ([place])   . Sepsis ([place]) 02/16/2020  . Acute on chronic respiratory failure with hypoxia ([place]) 02/16/2020  . Edema of left lower leg 02/16/2020  . Pneumonia due  to COVID-19 virus 02/15/2020  . Acute on chronic diastolic HF (heart failure) ([place]) 11/18/2019  . Dyslipidemia 11/18/2019  . Polypharmacy 04/13/2019  . Healthcare maintenance 10/15/2017  . ARF (acute renal failure) ([place]) 09/07/2017  . Chest pain 09/05/2017  . Ineffective health maintenance 06/26/2017  . Medication management 06/26/2017  . Retinal edema 10/07/2016  . Postherpetic neuralgia 12/21/2015  . Herpes zoster with complication 77/82/4235  . Cellulitis of face 11/27/2015  . Anemia of chronic disease 08/17/2015  . History of colonic polyps   . Hypertensive emergency 05/20/2015  . Hypokalemia 05/20/2015  . AKI (acute kidney injury) ([place])   . Esophageal dysmotility 03/01/2015  . Throat congestion 10/11/2014  . Morbid obesity due to  excess calories ([place]) 09/23/2014  . Acute asthmatic bronchitis 05/20/2014  . Abdominal pain, other specified site 09/13/2013  . Elevated liver enzymes 09/13/2013  . Cough 05/05/2013  . Pain in lower limb 03/05/2013  . DOE (dyspnea on exertion) 02/03/2013  . Chronic respiratory failure ([place]) 12/13/2012  . Stricture and stenosis of esophagus 08/31/2012  . Onychomycosis 06/01/2012  . Pain in joint, ankle and foot 06/01/2012  . Diabetes mellitus type 2 in obese ([place]) 04/29/2012  . Essential hypertension 07/19/2011  . CKD (chronic kidney disease), stage III ([place]) 05/19/2011  . Family history of malignant neoplasm of gastrointestinal tract 10/30/2010  . Bloating 10/16/2010  . Epigastric pain 10/16/2010  . Foot pain 09/25/2010  . Dysphagia 07/16/2010  . GERD (gastroesophageal reflux disease) 07/16/2010  . Microcytic anemia 07/16/2010  . Obstructive sleep apnea 06/16/2009  . HYPERCHOLESTEROLEMIA 02/16/2009  . 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 Gastroparesis 08/21/2007  . Chronic obstructive asthma ([place]) 08/09/2006    Past Surgical History:  Procedure Laterality Date  . ABDOMINAL HYSTERECTOMY    . ARTERY BIOPSY  01/07/2011   Procedure: MINOR BIOPSY TEMPORAL ARTERY;  Surgeon: Haywood Lasso, MD;  Location: [facility];  Service: General;  Laterality: Left;  left temporal artery biopsy  . AV FISTULA PLACEMENT Right 11/16/2018   Procedure: CREATION RIGHT BRACHIOCEPHALIC FISTULA  ARTERIOVENOUS FISTULA;  Surgeon: Angelia Mould, MD;  Location: [facility];  Service: Vascular;  Laterality: Right;  . bil foot surgery    . BREAST LUMPECTOMY     benign  . BREAST LUMPECTOMY     both breast lumps removed   . CATARACT EXTRACTION Left 06/2016   Dr. Read Drivers  . COLONOSCOPY    . COLONOSCOPY WITH PROPOFOL N/A 07/05/2015   Procedure: COLONOSCOPY WITH PROPOFOL;  Surgeon: Manus Gunning, MD;  Location: [facility];  Service: Gastroenterology;  Laterality: N/A;  .  COLONOSCOPY WITH PROPOFOL N/A 08/04/2018   Procedure: COLONOSCOPY WITH PROPOFOL;  Surgeon: Yetta Flock, MD;  Location: [facility];  Service: Gastroenterology;  Laterality: N/A;  . ERD  08/08/2000  . ESOPHAGEAL MANOMETRY N/A 03/13/2015   Procedure: ESOPHAGEAL MANOMETRY (EM);  Surgeon: Manus Gunning, MD;  [facility]: [facility];  Service: Gastroenterology;  Laterality: N/A;  . ESOPHAGOGASTRODUODENOSCOPY  06/25/2006  . ESOPHAGOGASTRODUODENOSCOPY (EGD) WITH PROPOFOL N/A 07/05/2015   Procedure: ESOPHAGOGASTRODUODENOSCOPY (EGD) WITH PROPOFOL;  Surgeon: Manus Gunning, MD;  [facility];  Service: Gastroenterology;  Laterality: N/A;  . FISTULA SUPERFICIALIZATION Right 11/16/2018   Procedure: Fistula Superficialization;  Surgeon: Angelia Mould, MD;  Location: [facility];  Service: Vascular;  Laterality: Right;  . HERNIA REPAIR    . IR PERC TUN PERIT CATH WO PORT S&I D[facility]a [facility]  02/17/2020  . IR US GUIDE VASC ACCESS RIGHT  02/18/2020  .  LEFT AND RIGHT HEART CATHETERIZATION WITH CORONARY ANGIOGRAM N/A 03/03/2013   Procedure: LEFT AND RIGHT HEART CATHETERIZATION WITH CORONARY ANGIOGRAM;  Surgeon: Minus Breeding, MD;  Location: [facility];  Service: Cardiovascular;  Laterality: N/A;  . REPLACEMENT TOTAL KNEE Left 1998  . UPPER GASTROINTESTINAL ENDOSCOPY       OB History   No obstetric history on file.     Family History  Problem Relation Age of Onset  . Liver cancer Mother        Liver Cancer  . Diabetes Mother   . Kidney disease Mother   . Heart disease Mother        age 89's  . Heart disease Father 17       MI  . Heart attack Father        died of MI when pt was 31  . Sickle cell anemia Father   . Colon cancer Brother   . Cancer Brother        Colon Cancer  . Diabetes Sister   . Kidney disease Sister   . Heart disease Sister        age 76's  . Allergies Sister   . Diabetes Sister   . Kidney disease Sister   . Heart disease Sister        age 33's  .  Esophageal cancer Neg Hx   . Rectal cancer Neg Hx   . Stomach cancer Neg Hx   . Amblyopia Neg Hx   . Blindness Neg Hx   . Glaucoma Neg Hx   . Macular degeneration Neg Hx   . Retinal detachment Neg Hx   . Cataracts Neg Hx   . Strabismus Neg Hx   . Retinitis pigmentosa Neg Hx     Social History   Tobacco Use  . Smoking status: Former Smoker    Packs/day: 0.50    Years: 10.00    Pack years: 5.00    Quit date: 04/18/1980    Years since quitting: 40.1  . Smokeless tobacco: Never Used  Vaping Use  . Vaping Use: Never used  Substance Use Topics  . Alcohol use: No    Alcohol/week: 0.0 standard drinks  . Drug use: No    Home Medications Prior to Admission medications   Medication Sig Start Date End Date Taking? Authorizing Provider  acetaminophen (TYLENOL) 325 MG tablet Take 650 mg by mouth every 4 (four) hours as needed for moderate pain.    [provider]  albuterol (VENTOLIN HFA) 108 (90 Base) MCG/ACT inhaler Inhale 2 puffs into the lungs every 4 (four) hours as needed for wheezing or shortness of breath. 04/08/20   Ghimire, Henreitta Leber, MD  ascorbic acid (VITAMIN C) 500 MG tablet Take 1 tablet (500 mg total) by mouth daily. Patient not taking: Reported on 05/08/2020 04/08/20   Jonetta Osgood, MD  aspirin EC 81 MG tablet Take 1 tablet (81 mg total) by mouth daily. For 2 weeks-along with Plavix-after 2 weeks-stop aspirin. 04/08/20   Ghimire, Henreitta Leber, MD  azelastine (ASTELIN) 0.1 % nasal spray Place 1 spray into both nostrils 2 (two) times daily as needed (allergies). Use in each nostril as directed 04/08/20   Ghimire, Henreitta Leber, MD  bisacodyl (DULCOLAX) 5 MG EC tablet Take 1 tablet (5 mg total) by mouth daily as needed for moderate constipation. 04/08/20   Ghimire, Henreitta Leber, MD  calcium carbonate (OSCAL) 1500 (600 Ca) MG TABS tablet Take 1 tablet (1,500 mg total) by mouth  daily. 04/08/20   Ghimire, Henreitta Leber, MD  clopidogrel (PLAVIX) 75 MG tablet Take 1 tablet (75 mg total)  by mouth daily. 04/08/20   Ghimire, Henreitta Leber, MD  donepezil (ARICEPT) 5 MG tablet Take 1 tablet (5 mg total) by mouth at bedtime. 04/08/20   Ghimire, Henreitta Leber, MD  ezetimibe (ZETIA) 10 MG tablet Take 1 tablet (10 mg total) by mouth daily. 04/08/20   Ghimire, Henreitta Leber, MD  ferrous sulfate 325 (65 FE) MG tablet Take 1 tablet (325 mg total) by mouth daily. Patient not taking: Reported on 05/08/2020 04/08/20   Jonetta Osgood, MD  Fluticasone-Umeclidin-Vilant (TRELEGY ELLIPTA) 100-62.5-25 MCG/INH AEPB Take 1 puff by mouth daily. 04/08/20   Ghimire, Henreitta Leber, MD  guaiFENesin-dextromethorphan (ROBITUSSIN DM) 100-10 MG/5ML syrup Take 10 mLs by mouth every 4 (four) hours as needed for cough. 03/02/20   Sheikh, Omair Latif, DO  Ipratropium-Albuterol (COMBIVENT) 20-100 MCG/ACT AERS respimat Inhale 1 puff into the lungs every 6 (six) hours as needed for wheezing or shortness of breath. 04/08/20 05/08/20  Ghimire, Henreitta Leber, MD  lactose free nutrition (BOOST) LIQD Take 237 mLs by mouth in the morning and at bedtime.    [provider]  lip balm (CARMEX) ointment Apply topically as needed for lip care. 03/02/20   Sheikh, Omair Latif, DO  montelukast (SINGULAIR) 10 MG tablet Take 1 tablet (10 mg total) by mouth daily. Patient taking differently: Take 10 mg by mouth at bedtime as needed (breathing/allergies). 04/08/20   Ghimire, Henreitta Leber, MD  nitroGLYCERIN (NITROSTAT) 0.4 MG SL tablet Place 1 tablet (0.4 mg total) under the tongue every 5 (five) minutes as needed for chest pain. 04/08/20   Ghimire, Henreitta Leber, MD  OLANZapine zydis (ZYPREXA) 5 MG disintegrating tablet Take 1 tablet (5 mg total) by mouth at bedtime. 04/07/20   Ghimire, Henreitta Leber, MD  omeprazole (PRILOSEC) 40 MG capsule Take 1 capsule (40 mg total) by mouth in the morning and at bedtime. Please schedule a yearly follow up: [phone]. Thank you Patient not taking: Reported on 05/08/2020 04/08/20   Jonetta Osgood, MD  OXYGEN Inhale 2 L into the  lungs as needed. CPAP with oxygen at bedtime    [provider]  polyethylene glycol (MIRALAX) 17 g packet Take 17 g by mouth daily as needed (constipation.). Patient not taking: Reported on 05/08/2020 04/08/20   Jonetta Osgood, MD  Propylene Glycol (SYSTANE BALANCE) 0.6 % SOLN Place 1 drop into both eyes daily as needed (dry eyes).    [provider]  Vitamin D3 (VITAMIN D) 25 MCG tablet Take 1 tablet (1,000 Units total) by mouth daily. 04/08/20   Ghimire, Henreitta Leber, MD    Allergies    Morphine and related and Promethazine hcl  Review of Systems   Review of Systems  Constitutional: Negative for fever.  HENT: Negative for sore throat.   Respiratory: Positive for shortness of breath.   Cardiovascular: Negative for chest pain.  Gastrointestinal: Negative for abdominal pain, nausea and vomiting.  Genitourinary: Negative for flank pain.  Musculoskeletal: Positive for arthralgias and joint swelling.  Skin: Negative for pallor and wound.  Neurological: Negative for light-headedness and headaches.  All other systems reviewed and are negative.   Physical Exam Updated Vital Signs BP 112/84   Pulse 77   Temp 98.4 F (36.9 C) (Oral)   Resp 16   Ht 5\' 4"  (1.626 m)   Wt 94 kg   SpO2 99%   BMI 35.57 kg/m  Physical Exam Vitals and nursing note reviewed.  Constitutional:      Appearance: Normal appearance.  HENT:     Head: Normocephalic and atraumatic.     Mouth/Throat:     Mouth: Mucous membranes are moist.  Eyes:     Pupils: Pupils are equal, round, and reactive to light.  Cardiovascular:     Rate and Rhythm: Normal rate.  Pulmonary:     Effort: Pulmonary effort is normal.     Breath sounds: No wheezing or rales.  Abdominal:     Palpations: Abdomen is soft.     Tenderness: There is no right CVA tenderness or left CVA tenderness.  Musculoskeletal:        General: Swelling and tenderness present.     Cervical back: Normal range of motion and neck supple.      Right knee: Swelling, effusion and bony tenderness present. No deformity, erythema, ecchymosis or lacerations. Decreased range of motion. Tenderness present over the patellar tendon.     Comments: Pulses present, no erythema, no changes in the skin, sensation is intact. Limited ROM due to pain.   Skin:    General: Skin is warm and dry.  Neurological:     Mental Status: She is alert and oriented to person, place, and time.     ED Results / Procedures / Treatments   Labs (all labs ordered are listed, but only abnormal results are displayed) Labs Reviewed  CBC WITH DIFFERENTIAL/PLATELET - Abnormal; Notable for the following components:      Result Value   Hemoglobin 9.6 (*)    HCT 31.0 (*)    MCV 77.3 (*)    MCH 23.9 (*)    RDW 19.5 (*)    Platelets 148 (*)    Monocytes Absolute 1.5 (*)    Abs Immature Granulocytes 0.12 (*)    All other components within normal limits  COMPREHENSIVE METABOLIC PANEL - Abnormal; Notable for the following components:   Glucose, Bld 135 (*)    Creatinine, Ser 7.23 (*)    Calcium 8.8 (*)    Total Protein 6.1 (*)    Albumin 2.9 (*)    GFR, Estimated 5 (*)    All other components within normal limits    EKG None  Radiology DG Chest 2 View  Result Date: 06/15/2020 CLINICAL DATA:  Shortness of breath.  Right lower extremity pain. EXAM: CHEST - 2 VIEW COMPARISON:  05/11/2020 FINDINGS: Stable mild cardiomegaly. Aortic atherosclerotic calcification noted. Right dual lumen central venous catheter remains in appropriate position. Both lungs are clear. IMPRESSION: Mild cardiomegaly. No active lung disease. Electronically Signed   By: Marlaine Hind M.D.   On: 06/15/2020 21:32   DG Knee 2 Views Right  Result Date: 06/15/2020 CLINICAL DATA:  75 year old female with right knee pain. EXAM: RIGHT KNEE - 1-2 VIEW COMPARISON:  None. FINDINGS: No acute fracture. The bones are osteopenic. There is osteoarthritic changes with tricompartmental narrowing and severe  narrowing of the medial compartment. There is a moderate suprapatellar effusion. The soft tissues are grossly unremarkable. IMPRESSION: 1. No acute fracture or dislocation. 2. Osteopenia with tricompartmental osteoarthritis and a moderate suprapatellar effusion. Electronically Signed   By: Anner Crete M.D.   On: 06/15/2020 21:33   DG Ankle 2 Views Right  Result Date: 06/15/2020 CLINICAL DATA:  Pain. EXAM: RIGHT ANKLE - 2 VIEW COMPARISON:  None. FINDINGS: Two-view exam shows no gross fracture. Assessment for subluxation dislocation limited by positioning. Bones are diffusely demineralized. IMPRESSION: Limited study  due to positioning. No gross fracture. Follow-up three views study when patient is better able to participate in positioning recommended to assess for subluxation/dislocation. Electronically Signed   By: Misty Stanley M.D.   On: 06/15/2020 21:32    Procedures Procedures   Medications Ordered in ED Medications  oxyCODONE (Oxy IR/ROXICODONE) immediate release tablet 5 mg (5 mg Oral Given 06/15/20 2336)    ED Course  I have reviewed the triage vital signs and the nursing notes.  Pertinent labs & imaging results that were available during my care of the patient were reviewed by me and considered in my medical decision making (see chart for details).    MDM Rules/Calculators/A&P  Patient here with new right knee pain, no falls, fevers, other complaints.  History provided by sister at the bedside.  Patient is mostly bedbound, does minimal walking, reports she was complaining of pain with flexion of her knee earlier this morning while getting dressed by her niece.  Does have a prior history of left knee replacement.  No interventions to her right knee.  States is exacerbated with flexion.  She was supposed to be dialyzed this morning, however missed a session due to ongoing pain.  During evaluation there is swelling noted to the right knee, she is afebrile on arrival.  No changes  consistent with infection or streaking in the skin.  No trauma reported by patient along with family member.  Will obtain plain films in order to further evaluate DG Knee right: 1. No acute fracture or dislocation.  2. Osteopenia with tricompartmental osteoarthritis and a moderate  suprapatellar effusion.       X-ray of the right ankle without any acute finding.  She was given oxycodone immediate release which she has taken in the past according to review of her PDMP.  Chart was also forward to the renal navigator, in order to have dialysis rescheduled.  I did obtain basic blood work from patient, kidney function within her previous levels.  CBC without leukocytosis, slight decrease within her baseline.  X-ray without any findings, potassium within normal limits.  I explained these results with patient sister at the bedside, she is aware of plan and treatment.  Patient understands and agrees management, patient stable for discharge   Portions of this note were generated with Dragon dictation software. Dictation errors may occur despite best attempts at proofreading.  Final Clinical Impression(s) / ED Diagnoses Final diagnoses:  Effusion of right knee    Rx / DC Orders ED Discharge Orders    None       Janeece Fitting, PA-C 06/15/20 2350    Lucrezia Starch, MD 06/16/20 [phone]

## 2020-06-15 NOTE — ED Triage Notes (Signed)
Pt BIB [facility] for rt leg pain.  It is swollen and painful to touch.  EMS states it does not appear to be red.  PT has hx of CHf ANd COPD according to EMS. EMS endorses palpable pulses. EMS vitsl: B/P: 112/70 97 96, CBG 157.

## 2020-06-15 NOTE — ED Notes (Signed)
Pt is in radiology at this time.  

## 2020-06-15 NOTE — Discharge Instructions (Addendum)
The x-ray of your right knee showed some degenerative changes consistent with arthritis.  I provided you today with a brace for comfort, please wear this at all times.  You may take Tylenol to help with your pain.  Your chart was forwarded to our dialysis navigator who will contact you in order to reschedule dialysis.

## 2020-06-16 NOTE — Progress Notes (Signed)
Orthopedic Tech Progress Note Patient Details:  Joann Gutierrez 1945-12-13 [id number]  Patient ID: Joann Gutierrez, female   DOB: 21-Nov-1945, 75 y.o.   MRN: [id number]   Joann Gutierrez 06/16/2020, 12:12 AM Knee wrapped with ace x 2 . Knee brace wouldn't fit and was very painful.

## 2020-06-16 NOTE — ED Notes (Signed)
[facility] arrived for transport 

## 2020-06-16 NOTE — ED Notes (Signed)
[phone] [name], call when leaving so she can open patient's home for ambulance.

## 2020-06-16 NOTE — ED Notes (Signed)
Received verbal report from [place] at this time

## 2020-06-16 NOTE — ED Notes (Signed)
Requested [facility] - [phone]. [facility] dispatched to transport pt home.

## 2020-06-20 ENCOUNTER — Emergency Department (HOSPITAL_COMMUNITY): Payer: Medicare HMO

## 2020-06-20 ENCOUNTER — Other Ambulatory Visit: Payer: Self-pay

## 2020-06-20 ENCOUNTER — Encounter (HOSPITAL_COMMUNITY): Payer: Self-pay

## 2020-06-20 ENCOUNTER — Emergency Department (HOSPITAL_COMMUNITY)
Admission: EM | Admit: 2020-06-20 | Discharge: 2020-06-21 | Disposition: A | Discharge status: Home / Self Care | Payer: Medicare HMO | Attending: Emergency Medicine | Admitting: Emergency Medicine

## 2020-06-20 ENCOUNTER — Emergency Department (HOSPITAL_BASED_OUTPATIENT_CLINIC_OR_DEPARTMENT_OTHER): Payer: Medicare HMO

## 2020-06-20 DIAGNOSIS — Z7982 Long term (current) use of aspirin: Secondary | ICD-10-CM | POA: Insufficient documentation

## 2020-06-20 DIAGNOSIS — J45909 Unspecified asthma, uncomplicated: Secondary | ICD-10-CM | POA: Insufficient documentation

## 2020-06-20 DIAGNOSIS — G8929 Other chronic pain: Secondary | ICD-10-CM | POA: Diagnosis not present

## 2020-06-20 DIAGNOSIS — M25562 Pain in left knee: Secondary | ICD-10-CM | POA: Insufficient documentation

## 2020-06-20 DIAGNOSIS — I5033 Acute on chronic diastolic (congestive) heart failure: Secondary | ICD-10-CM | POA: Diagnosis not present

## 2020-06-20 DIAGNOSIS — R269 Unspecified abnormalities of gait and mobility: Secondary | ICD-10-CM | POA: Diagnosis not present

## 2020-06-20 DIAGNOSIS — N19 Unspecified kidney failure: Secondary | ICD-10-CM

## 2020-06-20 DIAGNOSIS — M79604 Pain in right leg: Secondary | ICD-10-CM | POA: Insufficient documentation

## 2020-06-20 DIAGNOSIS — E875 Hyperkalemia: Secondary | ICD-10-CM

## 2020-06-20 DIAGNOSIS — Z7951 Long term (current) use of inhaled steroids: Secondary | ICD-10-CM | POA: Diagnosis not present

## 2020-06-20 DIAGNOSIS — Z992 Dependence on renal dialysis: Secondary | ICD-10-CM | POA: Insufficient documentation

## 2020-06-20 DIAGNOSIS — I2510 Atherosclerotic heart disease of native coronary artery without angina pectoris: Secondary | ICD-10-CM | POA: Diagnosis not present

## 2020-06-20 DIAGNOSIS — N186 End stage renal disease: Secondary | ICD-10-CM | POA: Diagnosis not present

## 2020-06-20 DIAGNOSIS — I132 Hypertensive heart and chronic kidney disease with heart failure and with stage 5 chronic kidney disease, or end stage renal disease: Secondary | ICD-10-CM | POA: Insufficient documentation

## 2020-06-20 DIAGNOSIS — M7989 Other specified soft tissue disorders: Secondary | ICD-10-CM | POA: Diagnosis not present

## 2020-06-20 DIAGNOSIS — E1122 Type 2 diabetes mellitus with diabetic chronic kidney disease: Secondary | ICD-10-CM | POA: Diagnosis not present

## 2020-06-20 DIAGNOSIS — Z20822 Contact with and (suspected) exposure to covid-19: Secondary | ICD-10-CM | POA: Insufficient documentation

## 2020-06-20 DIAGNOSIS — Z7902 Long term (current) use of antithrombotics/antiplatelets: Secondary | ICD-10-CM | POA: Insufficient documentation

## 2020-06-20 DIAGNOSIS — R262 Difficulty in walking, not elsewhere classified: Secondary | ICD-10-CM

## 2020-06-20 DIAGNOSIS — F0390 Unspecified dementia without behavioral disturbance: Secondary | ICD-10-CM | POA: Diagnosis not present

## 2020-06-20 LAB — CBC WITH DIFFERENTIAL/PLATELET
Abs Immature Granulocytes: 0.38 10*3/uL — ABNORMAL HIGH (ref 0.00–0.07)
Basophils Absolute: 0 10*3/uL (ref 0.0–0.1)
Basophils Relative: 0 %
Eosinophils Absolute: 0 10*3/uL (ref 0.0–0.5)
Eosinophils Relative: 1 %
HCT: 33.2 % — ABNORMAL LOW (ref 36.0–46.0)
Hemoglobin: 10.2 g/dL — ABNORMAL LOW (ref 12.0–15.0)
Immature Granulocytes: 5 %
Lymphocytes Relative: 35 %
Lymphs Abs: 3 10*3/uL (ref 0.7–4.0)
MCH: 23.9 pg — ABNORMAL LOW (ref 26.0–34.0)
MCHC: 30.7 g/dL (ref 30.0–36.0)
MCV: 77.8 fL — ABNORMAL LOW (ref 80.0–100.0)
Monocytes Absolute: 1.4 10*3/uL — ABNORMAL HIGH (ref 0.1–1.0)
Monocytes Relative: 17 %
Neutro Abs: 3.6 10*3/uL (ref 1.7–7.7)
Neutrophils Relative %: 42 %
Platelets: 230 10*3/uL (ref 150–400)
RBC: 4.27 MIL/uL (ref 3.87–5.11)
RDW: 20.4 % — ABNORMAL HIGH (ref 11.5–15.5)
WBC: 8.4 10*3/uL (ref 4.0–10.5)
nRBC: 1.1 % — ABNORMAL HIGH (ref 0.0–0.2)

## 2020-06-20 LAB — BASIC METABOLIC PANEL
Anion gap: 17 — ABNORMAL HIGH (ref 5–15)
BUN: 47 mg/dL — ABNORMAL HIGH (ref 8–23)
CO2: 19 mmol/L — ABNORMAL LOW (ref 22–32)
Calcium: 8.5 mg/dL — ABNORMAL LOW (ref 8.9–10.3)
Chloride: 103 mmol/L (ref 98–111)
Creatinine, Ser: 13.38 mg/dL — ABNORMAL HIGH (ref 0.44–1.00)
GFR, Estimated: 3 mL/min — ABNORMAL LOW (ref >60)
Glucose, Bld: 66 mg/dL — ABNORMAL LOW (ref 70–99)
Potassium: 6.3 mmol/L (ref 3.5–5.1)
Sodium: 139 mmol/L (ref 135–145)

## 2020-06-20 LAB — RENAL FUNCTION PANEL
Albumin: 2.9 g/dL — ABNORMAL LOW (ref 3.5–5.0)
Anion gap: 19 — ABNORMAL HIGH (ref 5–15)
BUN: 46 mg/dL — ABNORMAL HIGH (ref 8–23)
CO2: 18 mmol/L — ABNORMAL LOW (ref 22–32)
Calcium: 8.6 mg/dL — ABNORMAL LOW (ref 8.9–10.3)
Chloride: 102 mmol/L (ref 98–111)
Creatinine, Ser: 13.48 mg/dL — ABNORMAL HIGH (ref 0.44–1.00)
GFR, Estimated: 3 mL/min — ABNORMAL LOW (ref >60)
Glucose, Bld: 66 mg/dL — ABNORMAL LOW (ref 70–99)
Phosphorus: 6.8 mg/dL — ABNORMAL HIGH (ref 2.5–4.6)
Potassium: 5.5 mmol/L — ABNORMAL HIGH (ref 3.5–5.1)
Sodium: 139 mmol/L (ref 135–145)

## 2020-06-20 LAB — RESP PANEL BY RT-PCR (FLU A&B, COVID) ARPGX2
Influenza A by PCR: NEGATIVE
Influenza B by PCR: NEGATIVE
SARS Coronavirus 2 by RT PCR: NEGATIVE

## 2020-06-20 MED ORDER — HEPARIN SODIUM (PORCINE) 1000 UNIT/ML DIALYSIS
1000.0000 [IU] | INTRAMUSCULAR | Status: DC | PRN
  Filled 2020-06-20: qty 1

## 2020-06-20 MED ORDER — SODIUM CHLORIDE 0.9 % IV SOLN: 100.0000 mL | INTRAVENOUS | Status: DC | PRN

## 2020-06-20 MED ORDER — CHLORHEXIDINE GLUCONATE CLOTH 2 % EX PADS: 6.0000 | MEDICATED_PAD | Freq: Every day | CUTANEOUS | Status: DC

## 2020-06-20 MED ORDER — OXYCODONE-ACETAMINOPHEN 5-325 MG PO TABS: 1.0000 | ORAL_TABLET | Freq: Four times a day (QID) | ORAL | 0 refills | Status: DC | PRN

## 2020-06-20 MED ORDER — PENTAFLUOROPROP-TETRAFLUOROETH EX AERO
1.0000 "application " | INHALATION_SPRAY | CUTANEOUS | Status: DC | PRN
  Filled 2020-06-20: qty 116

## 2020-06-20 MED ORDER — LIDOCAINE-PRILOCAINE 2.5-2.5 % EX CREA
1.0000 "application " | TOPICAL_CREAM | CUTANEOUS | Status: DC | PRN
  Filled 2020-06-20: qty 5

## 2020-06-20 MED ORDER — LIDOCAINE HCL (PF) 1 % IJ SOLN: 5.0000 mL | INTRAMUSCULAR | Status: DC | PRN

## 2020-06-20 MED ORDER — SODIUM ZIRCONIUM CYCLOSILICATE 10 G PO PACK
10.0000 g | PACK | Freq: Once | ORAL | Status: AC
  Administered 2020-06-20: 10 g via ORAL
  Filled 2020-06-20: qty 1

## 2020-06-20 MED ORDER — ALTEPLASE 2 MG IJ SOLR: 2.0000 mg | Freq: Once | INTRAMUSCULAR | Status: DC | PRN

## 2020-06-20 NOTE — ED Triage Notes (Addendum)
Pt arrived via [facility] for c/o 10/10 pain in right leg. Per EMS family told them pt is unable to bear weight on that leg. Per EMS there is not a known injury to leg. No deformity noted to leg. Per EMS pt last went to dialysis on Tues. Pt has 2+ right pedal pulse, cap refill less than 3 sec, pt able to wiggle toes.

## 2020-06-20 NOTE — ED Provider Notes (Addendum)
4:30 PM-patient checkout from Dr. Gilford Raid, to evaluate labs then consider disposition  6:40 PM.  Patient is uremic with elevated potassium.  We will contact nephrology.  At this time patient sister is in the room, she did not communicate with the patient's "friend," who Dr. Evangeline Gula and talked to earlier.  The sister states the patient has been having right knee pain for a week and a half.  No known trauma.  She has been evaluated in the ED for this and the sister feels like the knee pain is preventing patient going to dialysis.  The patient is unable to wear knee brace because of her large leg size.  The patient was referred to orthopedics for the knee pain but has not been able to go because she cannot get in her car to travel.  Dr. Gilford Raid has started the process of home health evaluation treatment.  The patient's sister is worried that this may not work because the patient typically cannot participate when she is having pain.  Apparently this is what caused prior home health services to be stopped.  Case discussed with nephrology to arrange for dialysis and further intervention as needed.  She will arrange for dialysis; call completed at 8:00 PM.  Plan is to reassess the patient following dialysis to determine if she can go home.  I suspect that she will require hospitalization.   .Critical Care Performed by: Daleen Bo, MD Authorized by: Daleen Bo, MD   Critical care provider statement:    Critical care time (minutes):  35   Critical care start time:  2020-06-22 4:30 PM   Critical care end time:  06-22-20 7:55 PM   Critical care time was exclusive of:  Separately billable procedures and treating other patients   Critical care was necessary to treat or prevent imminent or life-threatening deterioration of the following conditions:  Metabolic crisis   Critical care was time spent personally by me on the following activities:  Blood draw for specimens, development of treatment plan with  patient or surrogate, discussions with consultants, evaluation of patient's response to treatment, examination of patient, obtaining history from patient or surrogate, ordering and performing treatments and interventions, ordering and review of laboratory studies, pulse oximetry, re-evaluation of patient's condition, review of old charts and ordering and review of radiographic studies       Daleen Bo, MD Jun 22, 2020 2027  11:35 PM-patient's sister returned and we had another long discussion about the patient's care.  Patient will be dialyzed shortly.  After that I believe the patient can return home to continue with home health services as initially planned and ordered.  Patient's sister is now agreeable to this process.  She understands that the patient might need to take Percocet to help with her pain.  All pertinent questions were answered.    Daleen Bo, MD 06-22-2020 [phone]

## 2020-06-20 NOTE — ED Provider Notes (Signed)
[facility] EMERGENCY DEPARTMENT Provider Note   CSN: [id number] Arrival date & time: 06/20/20  1321     History Chief Complaint  Patient presents with  . Leg Pain    Joann Gutierrez is a 75 y.o. female.  Pt presents to the ED today with right leg pain.  The pt has dementia and is a poor historian.  Per EMS, pt has not had any falls, but has had continued pain.  The pt lives at home and is mainly bedbound.  She will use her legs to transfer.  Pt was here on 5/26 for right knee pain.  She was d/c with oxycodone.  Pt is also a dialysis patient and has not gone to dialysis since 5/24.         Past Medical History:  Diagnosis Date  . Adenomatous colon polyp   . Allergy   . Anemia   . Asthma       . CAD (coronary artery disease)    Mild very minimal coronary disease with 20% obtuse marginal stenosis  . Carpal tunnel syndrome on left   . Cataract   . CHF (congestive heart failure) ([place])   . Chronic kidney disease (CKD), stage III (moderate) (HCC)    now stage 4  . COPD (chronic obstructive pulmonary disease) ([place])   . Dementia with behavioral disturbance ([place]) 03/09/2020  . Depression   . 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 Diverticulosis   . Dyspnea   . 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 Esophageal dysmotility   . Esophageal stricture   . ESRD on hemodialysis ([place]) 03/09/2020  . Gastritis   . Gastroparesis 08/21/2007  . GERD (gastroesophageal reflux disease)   . Hearing loss of both ears   . Hernia, hiatal   . Hyperlipidemia   . Hypertension   . Morbid obesity ([place])   . OSTEOARTHRITIS 08/09/2006  . Osteoporosis   . Oxygen deficiency    prn   . 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 RENAL INSUFFICIENCY 02/16/2009  . 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 Seizures ([place])    pt thinks it has been several monthes since she had a seisure  . Shingles   . Sickle cell trait ([place])   . Sleep apnea    uses cpap  . Stroke  [facility])     Patient Active Problem List   Diagnosis Date Noted  . Delirium 04/10/2020  . Adult failure to thrive 04/10/2020  . Acute bilat watershed infarction [facility]) 03/31/2020  . AMS (altered mental status) 03/29/2020  . Acute metabolic encephalopathy 44/03/4740  . ESRD on hemodialysis ([place]) 03/09/2020  . Thrombocytopenia ([place]) 03/09/2020  . Dementia with behavioral disturbance ([place]) 03/09/2020  . COVID-19   . SOB (shortness of breath)   . Type 2 diabetes mellitus with hyperlipidemia ([place])   . Sepsis ([place]) 02/16/2020  . Acute on chronic respiratory failure with hypoxia ([name]) 02/16/2020  . Edema of left lower leg 02/16/2020  . Pneumonia due to COVID-19 virus 02/15/2020  . Acute on chronic diastolic HF (heart failure) ([place]) 11/18/2019  . Dyslipidemia 11/18/2019  . Polypharmacy 04/13/2019  . Healthcare maintenance 10/15/2017  . ARF (acute renal failure) ([place]) 09/07/2017  . Chest pain 09/05/2017  . Ineffective health maintenance 06/26/2017  . Medication management 06/26/2017  . Retinal edema 10/07/2016  . Postherpetic neuralgia 12/21/2015  . Herpes zoster with complication 59/56/3875  . Cellulitis of face 11/27/2015  . Anemia of chronic  disease 08/17/2015  . History of colonic polyps   . Hypertensive emergency 05/20/2015  . Hypokalemia 05/20/2015  . AKI (acute kidney injury) ([place])   . Esophageal dysmotility 03/01/2015  . Throat congestion 10/11/2014  . Morbid obesity due to excess calories ([place]) 09/23/2014  . Acute asthmatic bronchitis 05/20/2014  . Abdominal pain, other specified site 09/13/2013  . Elevated liver enzymes 09/13/2013  . Cough 05/05/2013  . Pain in lower limb 03/05/2013  . DOE (dyspnea on exertion) 02/03/2013  . Chronic respiratory failure ([place]) 12/13/2012  . Stricture and stenosis of esophagus 08/31/2012  . Onychomycosis 06/01/2012  . Pain in joint, ankle and foot 06/01/2012  . Diabetes mellitus type 2 in obese ([place]) 04/29/2012  . Essential hypertension  07/19/2011  . CKD (chronic kidney disease), stage III ([place]) 05/19/2011  . Family history of malignant neoplasm of gastrointestinal tract 10/30/2010  . Bloating 10/16/2010  . Epigastric pain 10/16/2010  . Foot pain 09/25/2010  . Dysphagia 07/16/2010  . GERD (gastroesophageal reflux disease) 07/16/2010  . Microcytic anemia 07/16/2010  . Obstructive sleep apnea 06/16/2009  . HYPERCHOLESTEROLEMIA 02/16/2009  . 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 Gastroparesis 08/21/2007  . Chronic obstructive asthma ([place]) 08/09/2006    Past Surgical History:  Procedure Laterality Date  . ABDOMINAL HYSTERECTOMY    . ARTERY BIOPSY  01/07/2011   Procedure: MINOR BIOPSY TEMPORAL ARTERY;  Surgeon: Haywood Lasso, MD;  Location: [facility];  Service: General;  Laterality: Left;  left temporal artery biopsy  . AV FISTULA PLACEMENT Right 11/16/2018   Procedure: CREATION RIGHT BRACHIOCEPHALIC FISTULA  ARTERIOVENOUS FISTULA;  Surgeon: Angelia Mould, MD;  Location: [facility];  Service: Vascular;  Laterality: Right;  . bil foot surgery    . BREAST LUMPECTOMY     benign  . BREAST LUMPECTOMY     both breast lumps removed   . CATARACT EXTRACTION Left 06/2016   Dr. Read Drivers  . COLONOSCOPY    . COLONOSCOPY WITH PROPOFOL N/A 07/05/2015   Procedure: COLONOSCOPY WITH PROPOFOL;  Surgeon: Manus Gunning, MD;  [facility];  Service: Gastroenterology;  Laterality: N/A;  . COLONOSCOPY WITH PROPOFOL N/A 08/04/2018   Procedure: COLONOSCOPY WITH PROPOFOL;  Surgeon: Yetta Flock, MD;  Location: [facility];  Service: Gastroenterology;  Laterality: N/A;  . ERD  08/08/2000  . ESOPHAGEAL MANOMETRY N/A 03/13/2015   Procedure: ESOPHAGEAL MANOMETRY (EM);  Surgeon: Manus Gunning, MD;  [facility];  Service: Gastroenterology;  Laterality: N/A;  . ESOPHAGOGASTRODUODENOSCOPY  06/25/2006  . ESOPHAGOGASTRODUODENOSCOPY (EGD) WITH PROPOFOL N/A 07/05/2015   Procedure:  ESOPHAGOGASTRODUODENOSCOPY (EGD) WITH PROPOFOL;  Surgeon: Manus Gunning, MD;  [facility];  Service: Gastroenterology;  Laterality: N/A;  . FISTULA SUPERFICIALIZATION Right 11/16/2018   Procedure: Fistula Superficialization;  Surgeon: Angelia Mould, MD;  Location: [facility];  Service: Vascular;  Laterality: Right;  . HERNIA REPAIR    . IR PERC TUN PERIT CATH WO PORT S&I [place]  02/17/2020  . IR US GUIDE VASC ACCESS RIGHT  02/18/2020  . LEFT AND RIGHT HEART CATHETERIZATION WITH CORONARY ANGIOGRAM N/A 03/03/2013   Procedure: LEFT AND RIGHT HEART CATHETERIZATION WITH CORONARY ANGIOGRAM;  Surgeon: Minus Breeding, MD;  Location: [facility] C[facility] LAB;  Service: Cardiovascular;  Laterality: N/A;  . REPLACEMENT TOTAL KNEE Left 1998  . UPPER GASTROINTESTINAL ENDOSCOPY       OB History   No obstetric history on file.     Family History  Problem Relation Age of Onset  .  Liver cancer Mother        Liver Cancer  . Diabetes Mother   . Kidney disease Mother   . Heart disease Mother        age 6's  . Heart disease Father 69       MI  . Heart attack Father        died of MI when pt was 49  . Sickle cell anemia Father   . Colon cancer Brother   . Cancer Brother        Colon Cancer  . Diabetes Sister   . Kidney disease Sister   . Heart disease Sister        age 15's  . Allergies Sister   . Diabetes Sister   . Kidney disease Sister   . Heart disease Sister        age 38's  . Esophageal cancer Neg Hx   . Rectal cancer Neg Hx   . Stomach cancer Neg Hx   . Amblyopia Neg Hx   . Blindness Neg Hx   . Glaucoma Neg Hx   . Macular degeneration Neg Hx   . Retinal detachment Neg Hx   . Cataracts Neg Hx   . Strabismus Neg Hx   . Retinitis pigmentosa Neg Hx     Social History   Tobacco Use  . Smoking status: Former Smoker    Packs/day: 0.50    Years: 10.00    Pack years: 5.00    Quit date: 04/18/1980    Years since quitting: 40.2  . Smokeless tobacco: Never Used  Vaping Use   . Vaping Use: Never used  Substance Use Topics  . Alcohol use: No    Alcohol/week: 0.0 standard drinks  . Drug use: No    Home Medications Prior to Admission medications   Medication Sig Start Date End Date Taking? Authorizing Provider  oxyCODONE-acetaminophen (PERCOCET/ROXICET) 5-325 MG tablet Take 1 tablet by mouth every 6 (six) hours as needed for severe pain. 06/20/20  Yes Isla Pence, MD  acetaminophen (TYLENOL) 325 MG tablet Take 650 mg by mouth every 4 (four) hours as needed for moderate pain.    [provider]  albuterol (VENTOLIN HFA) 108 (90 Base) MCG/ACT inhaler Inhale 2 puffs into the lungs every 4 (four) hours as needed for wheezing or shortness of breath. 04/08/20   Ghimire, Henreitta Leber, MD  ascorbic acid (VITAMIN C) 500 MG tablet Take 1 tablet (500 mg total) by mouth daily. Patient not taking: Reported on 05/08/2020 04/08/20   Jonetta Osgood, MD  aspirin EC 81 MG tablet Take 1 tablet (81 mg total) by mouth daily. For 2 weeks-along with Plavix-after 2 weeks-stop aspirin. 04/08/20   Ghimire, Henreitta Leber, MD  azelastine (ASTELIN) 0.1 % nasal spray Place 1 spray into both nostrils 2 (two) times daily as needed (allergies). Use in each nostril as directed 04/08/20   Ghimire, Henreitta Leber, MD  bisacodyl (DULCOLAX) 5 MG EC tablet Take 1 tablet (5 mg total) by mouth daily as needed for moderate constipation. 04/08/20   Ghimire, Henreitta Leber, MD  calcium carbonate (OSCAL) 1500 (600 Ca) MG TABS tablet Take 1 tablet (1,500 mg total) by mouth daily. 04/08/20   Ghimire, Henreitta Leber, MD  clopidogrel (PLAVIX) 75 MG tablet Take 1 tablet (75 mg total) by mouth daily. 04/08/20   Ghimire, Henreitta Leber, MD  donepezil (ARICEPT) 5 MG tablet Take 1 tablet (5 mg total) by mouth at bedtime. 04/08/20   Ghimire, Henreitta Leber,  MD  ezetimibe (ZETIA) 10 MG tablet Take 1 tablet (10 mg total) by mouth daily. 04/08/20   Ghimire, Henreitta Leber, MD  ferrous sulfate 325 (65 FE) MG tablet Take 1 tablet (325 mg total) by mouth  daily. Patient not taking: Reported on 05/08/2020 04/08/20   Jonetta Osgood, MD  Fluticasone-Umeclidin-Vilant (TRELEGY ELLIPTA) 100-62.5-25 MCG/INH AEPB Take 1 puff by mouth daily. 04/08/20   Ghimire, Henreitta Leber, MD  guaiFENesin-dextromethorphan (ROBITUSSIN DM) 100-10 MG/5ML syrup Take 10 mLs by mouth every 4 (four) hours as needed for cough. 03/02/20   Sheikh, Omair Latif, DO  Ipratropium-Albuterol (COMBIVENT) 20-100 MCG/ACT AERS respimat Inhale 1 puff into the lungs every 6 (six) hours as needed for wheezing or shortness of breath. 04/08/20 05/08/20  Ghimire, Henreitta Leber, MD  lactose free nutrition (BOOST) LIQD Take 237 mLs by mouth in the morning and at bedtime.    [provider]  lip balm (CARMEX) ointment Apply topically as needed for lip care. 03/02/20   Sheikh, Omair Latif, DO  montelukast (SINGULAIR) 10 MG tablet Take 1 tablet (10 mg total) by mouth daily. Patient taking differently: Take 10 mg by mouth at bedtime as needed (breathing/allergies). 04/08/20   Ghimire, Henreitta Leber, MD  nitroGLYCERIN (NITROSTAT) 0.4 MG SL tablet Place 1 tablet (0.4 mg total) under the tongue every 5 (five) minutes as needed for chest pain. 04/08/20   Ghimire, Henreitta Leber, MD  OLANZapine zydis (ZYPREXA) 5 MG disintegrating tablet Take 1 tablet (5 mg total) by mouth at bedtime. 04/07/20   Ghimire, Henreitta Leber, MD  omeprazole (PRILOSEC) 40 MG capsule Take 1 capsule (40 mg total) by mouth in the morning and at bedtime. Please schedule a yearly follow up: [phone]. Thank you Patient not taking: Reported on 05/08/2020 04/08/20   Jonetta Osgood, MD  OXYGEN Inhale 2 L into the lungs as needed. CPAP with oxygen at bedtime    [provider]  polyethylene glycol (MIRALAX) 17 g packet Take 17 g by mouth daily as needed (constipation.). Patient not taking: Reported on 05/08/2020 04/08/20   Jonetta Osgood, MD  Propylene Glycol (SYSTANE BALANCE) 0.6 % SOLN Place 1 drop into both eyes daily as needed (dry eyes).     [provider]  Vitamin D3 (VITAMIN D) 25 MCG tablet Take 1 tablet (1,000 Units total) by mouth daily. 04/08/20   Ghimire, Henreitta Leber, MD    Allergies    Morphine and related and Promethazine hcl  Review of Systems   Review of Systems  Musculoskeletal:       Right leg pain  All other systems reviewed and are negative.   Physical Exam Updated Vital Signs BP 136/77   Pulse 81   Temp 98.8 F (37.1 C) (Oral)   Resp 16   Ht 5\' 4"  (1.626 m)   Wt 94 kg   SpO2 100%   BMI 35.57 kg/m   Physical Exam Vitals and nursing note reviewed.  Constitutional:      Appearance: Normal appearance.  HENT:     Head: Normocephalic and atraumatic.     Right Ear: External ear normal.     Left Ear: External ear normal.     Nose: Nose normal.     Mouth/Throat:     Mouth: Mucous membranes are moist.     Pharynx: Oropharynx is clear.  Eyes:     Extraocular Movements: Extraocular movements intact.     Conjunctiva/sclera: Conjunctivae normal.     Pupils: Pupils are equal, round, and  reactive to light.  Cardiovascular:     Rate and Rhythm: Normal rate and regular rhythm.     Pulses: Normal pulses.     Heart sounds: Normal heart sounds.  Pulmonary:     Effort: Pulmonary effort is normal.     Breath sounds: Normal breath sounds.  Abdominal:     General: Abdomen is flat. Bowel sounds are normal.     Palpations: Abdomen is soft.  Musculoskeletal:     Cervical back: Normal range of motion and neck supple.     Right knee: Decreased range of motion.  Skin:    General: Skin is warm.     Capillary Refill: Capillary refill takes less than 2 seconds.  Neurological:     General: No focal deficit present.     Mental Status: She is alert and oriented to person, place, and time.  Psychiatric:        Mood and Affect: Mood normal.        Behavior: Behavior normal.        Thought Content: Thought content normal.        Judgment: Judgment normal.     ED Results / Procedures / Treatments    Labs (all labs ordered are listed, but only abnormal results are displayed) Labs Reviewed  BASIC METABOLIC PANEL  CBC WITH DIFFERENTIAL/PLATELET    EKG None  Radiology DG Chest Portable 1 View  Result Date: 06/20/2020 CLINICAL DATA:  Shortness of breath EXAM: PORTABLE CHEST 1 VIEW COMPARISON:  Jun 15, 2020 FINDINGS: Central catheter tip is at the cavoatrial junction. No pneumothorax. There is stable elevation the right hemidiaphragm with mild right base atelectasis. Lungs elsewhere clear. Heart size and pulmonary vascularity are normal. There is aortic atherosclerosis. No adenopathy. No bone lesions. IMPRESSION: Central catheter position unchanged without pneumothorax. Mild elevation right hemidiaphragm with mild right base atelectasis. Lungs otherwise clear. Stable cardiac silhouette. Aortic Atherosclerosis (ICD10-I70.0). Electronically Signed   By: Lowella Grip III M.D.   On: 06/20/2020 14:40   VAS [place] LOWER EXTREMITY VENOUS (DVT) (ONLY [facility] & [facility] 7a-7p)  Result Date: 06/20/2020  Lower Venous DVT Study Patient Name:  Joann Gutierrez  Date of Exam:   06/20/2020 Medical Rec #: [id number]            Accession #:    [id number] Date of Birth: 1945/02/07            Patient Gender: F Patient Age:   28Y Exam Location:  [facility] Procedure:      VAS [place] LOWER EXTREMITY VENOUS (DVT) Referring Phys: [id number] Mat[place]ah Reyburn --------------------------------------------------------------------------------  Indications: Swelling, and Pain.  Limitations: Poor ultrasound/tissue interface and body habitus. Comparison Study: Previous exam 02/16/20 - negative Performing Technologist: Rogelia Rohrer  Examination Guidelines: A complete evaluation includes B-mode imaging, spectral Doppler, color Doppler, and power Doppler as needed of all accessible portions of each vessel. Bilateral testing is considered an integral part of a complete examination. Limited examinations for reoccurring indications may be performed  as noted. The reflux portion of the exam is performed with the patient in reverse Trendelenburg.  +---------+---------------+---------+-----------+----------+-------------------+ RIGHT    CompressibilityPhasicitySpontaneityPropertiesThrombus Aging      +---------+---------------+---------+-----------+----------+-------------------+ CFV      Full           Yes      Yes                                      +---------+---------------+---------+-----------+----------+-------------------+  SFJ      Full                                                             +---------+---------------+---------+-----------+----------+-------------------+ FV Prox  Full           Yes      Yes                                      +---------+---------------+---------+-----------+----------+-------------------+ FV Mid   Full           Yes      Yes                                      +---------+---------------+---------+-----------+----------+-------------------+ FV DistalFull           Yes      Yes                                      +---------+---------------+---------+-----------+----------+-------------------+ PFV      Full                                                             +---------+---------------+---------+-----------+----------+-------------------+ POP      Full           Yes      Yes                                      +---------+---------------+---------+-----------+----------+-------------------+ PTV      Full                                         Not well visualized +---------+---------------+---------+-----------+----------+-------------------+ PERO     Full                                         Not well visualized +---------+---------------+---------+-----------+----------+-------------------+   +----+---------------+---------+-----------+----------+--------------+ LEFTCompressibilityPhasicitySpontaneityPropertiesThrombus Aging  +----+---------------+---------+-----------+----------+--------------+ CFV Full           Yes      Yes                                 +----+---------------+---------+-----------+----------+--------------+    Summary: RIGHT: - There is no evidence of deep vein thrombosis in the lower extremity. - There is no evidence of superficial venous thrombosis.  - No cystic structure found in the popliteal fossa.  LEFT: - No evidence of common femoral vein obstruction.  *See table(s) above for measurements and observations. Electronically signed by Deitra Mayo MD on 06/20/2020 at 4:14:09 PM.  Final     Procedures Procedures   Medications Ordered in ED Medications - No data to display  ED Course  I have reviewed the triage vital signs and the nursing notes.  Pertinent labs & imaging results that were available during my care of the patient were reviewed by me and considered in my medical decision making (see chart for details).    MDM Rules/Calculators/A&P                          Pt d/w her friend.  The pt has not had her nurse tech for a few weeks because pt did not like her and the sister fired her.  However, there has been no one else.  Family wants help at home.  Pt has not been to dialysis in 1 week (confirmed).  Pt has been refusing to get on the [facility] bus because of the pain in her knee.  The pt will be prescribed percocet for pain.  If pt's K is nl, she can go home.  She is not hypoxic or sob.   Final Clinical Impression(s) / ED Diagnoses Final diagnoses:  ESRD (end stage renal disease) ([place])  Ambulatory dysfunction  Chronic pain of right knee    Rx / DC Orders ED Discharge Orders         Ordered    oxyCODONE-acetaminophen (PERCOCET/ROXICET) 5-325 MG tablet  Every 6 hours PRN        06/20/20 1650           Isla Pence, MD 06/20/20 1652

## 2020-06-20 NOTE — ED Notes (Signed)
Contacted provider about a diet for pt.

## 2020-06-20 NOTE — ED Notes (Signed)
Unable to get pt to sign MSE due to A&Ox3 and no family present.

## 2020-06-20 NOTE — Progress Notes (Signed)
Pt presenting to ED after not receiving dialysis x 1 week d/t knee pain.  Goes to [facility].  Apparently was refusing to get on SCAT d/t knee pain.    K 6.3, BUN 47.  Xrays from 5/26 without acute fracture.    Will dialyze.  If pain improved may be able to discharge afterwards (OK to d/c after dialysis from renal perspective).    Will follow.  Giving lokelma 10 g as well.  Madelon Lips MD [facility] pgr [phone]

## 2020-06-20 NOTE — Progress Notes (Signed)
RLE venous duplex has been completed.  Preliminary results given to [facility], RN.  Results can be found under chart review under CV PROC. 06/20/2020 3:14 PM Edith Groleau RVT, RDMS

## 2020-06-20 NOTE — ED Notes (Signed)
Turned TV on for pt.

## 2020-06-20 NOTE — ED Notes (Signed)
Pt transported to vascular.  °

## 2020-06-21 LAB — HEPATITIS B SURFACE ANTIGEN: Hepatitis B Surface Ag: NONREACTIVE

## 2020-06-21 LAB — HEPATITIS B SURFACE ANTIBODY,QUALITATIVE: Hep B S Ab: REACTIVE — AB

## 2020-06-21 LAB — CBG MONITORING, ED: Glucose-Capillary: 74 mg/dL (ref 70–99)

## 2020-06-21 LAB — HEPATITIS B CORE ANTIBODY, TOTAL: Hep B Core Total Ab: NONREACTIVE

## 2020-06-21 NOTE — ED Notes (Signed)
[name] daughter [phone] would like an update on the patient

## 2020-06-21 NOTE — ED Provider Notes (Addendum)
Returned from HD. Stable.      The patient appears reasonably screened and/or stabilized for discharge and I doubt any other medical condition or other [facility] requiring further screening, evaluation, or treatment in the ED at this time prior to discharge. Safe for discharge with strict return precautions.  Disposition: Discharge  Condition: Good  I have discussed the results, Dx and Tx plan with the patient/family who expressed understanding and agree(s) with the plan. Discharge instructions discussed at length. The patient/family was given strict return precautions who verbalized understanding of the instructions. No further questions at time of discharge.    ED Discharge Orders         Ordered    oxyCODONE-acetaminophen (PERCOCET/ROXICET) 5-325 MG tablet  Every 6 hours PRN        06/20/20 1650           Follow Up: Andree Moro, DO [place] [id number] [phone]  Schedule an appointment as soon as possible for a visit  For wound re-check  [address], [place] [address] [phone]  Schedule an appointment as soon as possible for a visit in 2 days As needed        Fatima Blank, MD 06/21/20 [phone]

## 2020-07-04 ENCOUNTER — Emergency Department (HOSPITAL_COMMUNITY): Payer: Medicare HMO

## 2020-07-04 ENCOUNTER — Encounter (HOSPITAL_COMMUNITY): Payer: Self-pay | Admitting: Emergency Medicine

## 2020-07-04 ENCOUNTER — Emergency Department (HOSPITAL_COMMUNITY)
Admission: EM | Admit: 2020-07-04 | Discharge: 2020-07-05 | Disposition: A | Discharge status: Home / Self Care | Payer: Medicare HMO | Attending: Emergency Medicine | Admitting: Emergency Medicine

## 2020-07-04 ENCOUNTER — Other Ambulatory Visit: Payer: Self-pay

## 2020-07-04 DIAGNOSIS — Z7902 Long term (current) use of antithrombotics/antiplatelets: Secondary | ICD-10-CM | POA: Diagnosis not present

## 2020-07-04 DIAGNOSIS — Z8616 Personal history of COVID-19: Secondary | ICD-10-CM | POA: Insufficient documentation

## 2020-07-04 DIAGNOSIS — J45909 Unspecified asthma, uncomplicated: Secondary | ICD-10-CM | POA: Diagnosis not present

## 2020-07-04 DIAGNOSIS — Z9115 Patient's noncompliance with renal dialysis: Secondary | ICD-10-CM

## 2020-07-04 DIAGNOSIS — N186 End stage renal disease: Secondary | ICD-10-CM | POA: Diagnosis not present

## 2020-07-04 DIAGNOSIS — M25551 Pain in right hip: Secondary | ICD-10-CM | POA: Insufficient documentation

## 2020-07-04 DIAGNOSIS — I132 Hypertensive heart and chronic kidney disease with heart failure and with stage 5 chronic kidney disease, or end stage renal disease: Secondary | ICD-10-CM | POA: Diagnosis not present

## 2020-07-04 DIAGNOSIS — G8929 Other chronic pain: Secondary | ICD-10-CM | POA: Insufficient documentation

## 2020-07-04 DIAGNOSIS — I5033 Acute on chronic diastolic (congestive) heart failure: Secondary | ICD-10-CM | POA: Insufficient documentation

## 2020-07-04 DIAGNOSIS — M160 Bilateral primary osteoarthritis of hip: Secondary | ICD-10-CM | POA: Insufficient documentation

## 2020-07-04 DIAGNOSIS — Z992 Dependence on renal dialysis: Secondary | ICD-10-CM | POA: Diagnosis not present

## 2020-07-04 DIAGNOSIS — Z87891 Personal history of nicotine dependence: Secondary | ICD-10-CM | POA: Diagnosis not present

## 2020-07-04 DIAGNOSIS — Z79899 Other long term (current) drug therapy: Secondary | ICD-10-CM | POA: Diagnosis not present

## 2020-07-04 DIAGNOSIS — E1122 Type 2 diabetes mellitus with diabetic chronic kidney disease: Secondary | ICD-10-CM | POA: Diagnosis not present

## 2020-07-04 DIAGNOSIS — M25561 Pain in right knee: Secondary | ICD-10-CM | POA: Diagnosis not present

## 2020-07-04 DIAGNOSIS — J449 Chronic obstructive pulmonary disease, unspecified: Secondary | ICD-10-CM | POA: Insufficient documentation

## 2020-07-04 DIAGNOSIS — I2510 Atherosclerotic heart disease of native coronary artery without angina pectoris: Secondary | ICD-10-CM | POA: Diagnosis not present

## 2020-07-04 DIAGNOSIS — F0391 Unspecified dementia with behavioral disturbance: Secondary | ICD-10-CM | POA: Diagnosis not present

## 2020-07-04 DIAGNOSIS — Z96652 Presence of left artificial knee joint: Secondary | ICD-10-CM | POA: Diagnosis not present

## 2020-07-04 DIAGNOSIS — M79606 Pain in leg, unspecified: Secondary | ICD-10-CM

## 2020-07-04 DIAGNOSIS — Z7982 Long term (current) use of aspirin: Secondary | ICD-10-CM | POA: Diagnosis not present

## 2020-07-04 LAB — BASIC METABOLIC PANEL
Anion gap: 20 — ABNORMAL HIGH (ref 5–15)
BUN: 53 mg/dL — ABNORMAL HIGH (ref 8–23)
CO2: 19 mmol/L — ABNORMAL LOW (ref 22–32)
Calcium: 7.8 mg/dL — ABNORMAL LOW (ref 8.9–10.3)
Chloride: 98 mmol/L (ref 98–111)
Creatinine, Ser: 17.53 mg/dL — ABNORMAL HIGH (ref 0.44–1.00)
GFR, Estimated: 2 mL/min — ABNORMAL LOW (ref >60)
Glucose, Bld: 80 mg/dL (ref 70–99)
Potassium: 5.3 mmol/L — ABNORMAL HIGH (ref 3.5–5.1)
Sodium: 137 mmol/L (ref 135–145)

## 2020-07-04 MED ORDER — HYDROCODONE-ACETAMINOPHEN 5-325 MG PO TABS
1.0000 | ORAL_TABLET | Freq: Once | ORAL | Status: AC
Start: 2020-07-04 — End: 2020-07-04
  Administered 2020-07-04: 1 via ORAL
  Filled 2020-07-04: qty 1

## 2020-07-04 MED ORDER — SODIUM ZIRCONIUM CYCLOSILICATE 10 G PO PACK
10.0000 g | PACK | Freq: Once | ORAL | Status: DC
  Filled 2020-07-04: qty 1

## 2020-07-04 NOTE — ED Notes (Signed)
Meal given

## 2020-07-04 NOTE — ED Provider Notes (Signed)
[facility] EMERGENCY DEPARTMENT Provider Note   CSN: [id number] Arrival date & time: 07/04/20  1126     History Chief Complaint  Patient presents with   Leg Pain    Joann Gutierrez is a 75 y.o. female.  75 year old female with extensive past medical history below including ESRD on HD, gastroparesis, CHF, COPD, dementia, osteoarthritis who presents with right knee pain.  Patient has been evaluated several times recently in the ED for right knee pain.  EMS was called today due to persistent right knee pain.  I spoke with significant other Joann Gutierrez over the phone who states that the patient has missed her last 2 dialysis sessions because she was having too much knee pain to get in the car.  She was prescribed oxycodone but got very sleepy after taking the medication so another provider recommended that she take Tylenol arthritis which they have been giving her.  She denies any new trauma or falls.  LEVEL 5 CAVEAT DUE TO DEMENTIA  The history is provided by the patient, the EMS personnel and a caregiver.  Leg Pain     Past Medical History:  Diagnosis Date   Adenomatous colon polyp    Allergy    Anemia    Asthma        CAD (coronary artery disease)    Mild very minimal coronary disease with 20% obtuse marginal stenosis   Carpal tunnel syndrome on left    Cataract    CHF (congestive heart failure) (HCC)    Chronic kidney disease (CKD), stage III (moderate) (HCC)    now stage 4   COPD (chronic obstructive pulmonary disease) (HCC)    Dementia with behavioral disturbance ([place]) 03/09/2020   Depression    Diabetes mellitus 1997   Type II    Diverticulosis    Dyspnea    Elevated diaphragm November 2011   Right side   Esophageal dysmotility    Esophageal stricture    ESRD on hemodialysis ([place]) 03/09/2020   Gastritis    Gastroparesis 08/21/2007   GERD (gastroesophageal reflux disease)    Hearing loss of both ears    Hernia, hiatal    Hyperlipidemia     Hypertension    Morbid obesity ([place])    OSTEOARTHRITIS 08/09/2006   Osteoporosis    Oxygen deficiency    prn    PERIPHERAL NEUROPATHY, FEET 09/23/2007   RENAL INSUFFICIENCY 02/16/2009   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   Seizures ([place])    pt thinks it has been several monthes since she had a seisure   Shingles    Sickle cell trait ([place])    Sleep apnea    uses cpap   Stroke [facility])     Patient Active Problem List   Diagnosis Date Noted   Delirium 04/10/2020   Adult failure to thrive 04/10/2020   Acute bilat watershed infarction [facility]) 03/31/2020   AMS (altered mental status) 14/48/1856   Acute metabolic encephalopathy 31/49/7026   ESRD on hemodialysis ([place]) 03/09/2020   Thrombocytopenia ([place]) 03/09/2020   Dementia with behavioral disturbance ([place]) 03/09/2020   COVID-19    SOB (shortness of breath)    Type 2 diabetes mellitus with hyperlipidemia ([place])    Sepsis ([place]) 02/16/2020   Acute on chronic respiratory failure with hypoxia ([place]) 02/16/2020   Edema of left lower leg 02/16/2020   Pneumonia due to COVID-19 virus 02/15/2020   Acute on chronic diastolic HF (heart failure) ([place]) 11/18/2019   Dyslipidemia 11/18/2019   Polypharmacy 04/13/2019  Healthcare maintenance 10/15/2017   ARF (acute renal failure) ([place]) 09/07/2017   Chest pain 09/05/2017   Ineffective health maintenance 06/26/2017   Medication management 06/26/2017   Retinal edema 10/07/2016   Postherpetic neuralgia 12/21/2015   Herpes zoster with complication 48/54/6270   Cellulitis of face 11/27/2015   Anemia of chronic disease 08/17/2015   History of colonic polyps    Hypertensive emergency 05/20/2015   Hypokalemia 05/20/2015   AKI (acute kidney injury) ([place])    Esophageal dysmotility 03/01/2015   Throat congestion 10/11/2014   Morbid obesity due to excess calories ([place]) 09/23/2014   Acute asthmatic bronchitis 05/20/2014   Abdominal pain, other specified site 09/13/2013   Elevated liver enzymes  09/13/2013   Cough 05/05/2013   Pain in lower limb 03/05/2013   DOE (dyspnea on exertion) 02/03/2013   Chronic respiratory failure ([place]) 12/13/2012   Stricture and stenosis of esophagus 08/31/2012   Onychomycosis 06/01/2012   Pain in joint, ankle and foot 06/01/2012   Diabetes mellitus type 2 in obese ([place]) 04/29/2012   Essential hypertension 07/19/2011   CKD (chronic kidney disease), stage III ([place]) 05/19/2011   Family history of malignant neoplasm of gastrointestinal tract 10/30/2010   Bloating 10/16/2010   Epigastric pain 10/16/2010   Foot pain 09/25/2010   Dysphagia 07/16/2010   GERD (gastroesophageal reflux disease) 07/16/2010   Microcytic anemia 07/16/2010   Obstructive sleep apnea 06/16/2009   HYPERCHOLESTEROLEMIA 02/16/2009   PERIPHERAL NEUROPATHY, FEET 09/23/2007   Gastroparesis 08/21/2007   Chronic obstructive asthma ([place]) 08/09/2006    Past Surgical History:  Procedure Laterality Date   ABDOMINAL HYSTERECTOMY     ARTERY BIOPSY  01/07/2011   Procedure: MINOR BIOPSY TEMPORAL ARTERY;  Surgeon: Haywood Lasso, MD;  Location: [facility];  Service: General;  Laterality: Left;  left temporal artery biopsy   AV FISTULA PLACEMENT Right 11/16/2018   Procedure: CREATION RIGHT BRACHIOCEPHALIC FISTULA  ARTERIOVENOUS FISTULA;  Surgeon: Angelia Mould, MD;  Location: [facility] OR;  Service: Vascular;  Laterality: Right;   bil foot surgery     BREAST LUMPECTOMY     benign   BREAST LUMPECTOMY     both breast lumps removed    CATARACT EXTRACTION Left 06/2016   Dr. Read Drivers   COLONOSCOPY     COLONOSCOPY WITH PROPOFOL N/A 07/05/2015   Procedure: COLONOSCOPY WITH PROPOFOL;  Surgeon: Manus Gunning, MD;  Location: [facility];  Service: Gastroenterology;  Laterality: N/A;   COLONOSCOPY WITH PROPOFOL N/A 08/04/2018   Procedure: COLONOSCOPY WITH PROPOFOL;  Surgeon: Yetta Flock, MD;  Location: [facility];  Service: Gastroenterology;  Laterality:  N/A;   ERD  08/08/2000   ESOPHAGEAL MANOMETRY N/A 03/13/2015   Procedure: ESOPHAGEAL MANOMETRY (EM);  Surgeon: Manus Gunning, MD;  Location: [facility];  Service: Gastroenterology;  Laterality: N/A;   ESOPHAGOGASTRODUODENOSCOPY  06/25/2006   ESOPHAGOGASTRODUODENOSCOPY (EGD) WITH PROPOFOL N/A 07/05/2015   Procedure: ESOPHAGOGASTRODUODENOSCOPY (EGD) WITH PROPOFOL;  Surgeon: Manus Gunning, MD;  Location: [facility];  Service: Gastroenterology;  Laterality: N/A;   FISTULA SUPERFICIALIZATION Right 11/16/2018   Procedure: Fistula Superficialization;  Surgeon: Angelia Mould, MD;  Location: [facility] OR;  Service: Vascular;  Laterality: Right;   HERNIA REPAIR     IR PERC TUN PERIT CATH WO PORT S&I /IMAG  02/17/2020   IR US GUIDE VASC ACCESS RIGHT  02/18/2020   LEFT AND RIGHT HEART CATHETERIZATION WITH CORONARY ANGIOGRAM N/A 03/03/2013   Procedure: LEFT AND RIGHT HEART CATHETERIZATION WITH CORONARY ANGIOGRAM;  Surgeon:  Minus Breeding, MD;  Location: [facility] CATH LAB;  Service: Cardiovascular;  Laterality: N/A;   REPLACEMENT TOTAL KNEE Left 1998   UPPER GASTROINTESTINAL ENDOSCOPY       OB History   No obstetric history on file.     Family History  Problem Relation Age of Onset   Liver cancer Mother        Liver Cancer   Diabetes Mother    Kidney disease Mother    Heart disease Mother        age 55's   Heart disease Father 39       MI   Heart attack Father        died of MI when pt was 5   Sickle cell anemia Father    Colon cancer Brother    Cancer Brother        Colon Cancer   Diabetes Sister    Kidney disease Sister    Heart disease Sister        age 60's   Allergies Sister    Diabetes Sister    Kidney disease Sister    Heart disease Sister        age 89's   Esophageal cancer Neg Hx    Rectal cancer Neg Hx    Stomach cancer Neg Hx    Amblyopia Neg Hx    Blindness Neg Hx    Glaucoma Neg Hx    Macular degeneration Neg Hx    Retinal detachment Neg Hx     Cataracts Neg Hx    Strabismus Neg Hx    Retinitis pigmentosa Neg Hx     Social History   Tobacco Use   Smoking status: Former    Packs/day: 0.50    Years: 10.00    Pack years: 5.00    Types: Cigarettes    Quit date: 04/18/1980    Years since quitting: 40.2   Smokeless tobacco: Never  Vaping Use   Vaping Use: Never used  Substance Use Topics   Alcohol use: No    Alcohol/week: 0.0 standard drinks   Drug use: No    Home Medications Prior to Admission medications   Medication Sig Start Date End Date Taking? Authorizing Provider  acetaminophen (TYLENOL) 325 MG tablet Take 650 mg by mouth every 4 (four) hours as needed for moderate pain.    [provider]  albuterol (VENTOLIN HFA) 108 (90 Base) MCG/ACT inhaler Inhale 2 puffs into the lungs every 4 (four) hours as needed for wheezing or shortness of breath. 04/08/20   Ghimire, Henreitta Leber, MD  ascorbic acid (VITAMIN C) 500 MG tablet Take 1 tablet (500 mg total) by mouth daily. Patient not taking: Reported on 05/08/2020 04/08/20   Jonetta Osgood, MD  aspirin EC 81 MG tablet Take 1 tablet (81 mg total) by mouth daily. For 2 weeks-along with Plavix-after 2 weeks-stop aspirin. 04/08/20   Ghimire, Henreitta Leber, MD  azelastine (ASTELIN) 0.1 % nasal spray Place 1 spray into both nostrils 2 (two) times daily as needed (allergies). Use in each nostril as directed 04/08/20   Ghimire, Henreitta Leber, MD  bisacodyl (DULCOLAX) 5 MG EC tablet Take 1 tablet (5 mg total) by mouth daily as needed for moderate constipation. 04/08/20   Ghimire, Henreitta Leber, MD  calcium carbonate (OSCAL) 1500 (600 Ca) MG TABS tablet Take 1 tablet (1,500 mg total) by mouth daily. 04/08/20   Ghimire, Henreitta Leber, MD  clopidogrel (PLAVIX) 75 MG tablet Take 1 tablet (75 mg total)  by mouth daily. 04/08/20   Ghimire, Henreitta Leber, MD  donepezil (ARICEPT) 5 MG tablet Take 1 tablet (5 mg total) by mouth at bedtime. 04/08/20   Ghimire, Henreitta Leber, MD  ezetimibe (ZETIA) 10 MG tablet Take 1  tablet (10 mg total) by mouth daily. 04/08/20   Ghimire, Henreitta Leber, MD  ferrous sulfate 325 (65 FE) MG tablet Take 1 tablet (325 mg total) by mouth daily. Patient not taking: Reported on 05/08/2020 04/08/20   Jonetta Osgood, MD  Fluticasone-Umeclidin-Vilant (TRELEGY ELLIPTA) 100-62.5-25 MCG/INH AEPB Take 1 puff by mouth daily. 04/08/20   Ghimire, Henreitta Leber, MD  guaiFENesin-dextromethorphan (ROBITUSSIN DM) 100-10 MG/5ML syrup Take 10 mLs by mouth every 4 (four) hours as needed for cough. 03/02/20   Sheikh, Omair Latif, DO  Ipratropium-Albuterol (COMBIVENT) 20-100 MCG/ACT AERS respimat Inhale 1 puff into the lungs every 6 (six) hours as needed for wheezing or shortness of breath. 04/08/20 05/08/20  Ghimire, Henreitta Leber, MD  lactose free nutrition (BOOST) LIQD Take 237 mLs by mouth in the morning and at bedtime.    [provider]  lip balm (CARMEX) ointment Apply topically as needed for lip care. 03/02/20   Sheikh, Omair Latif, DO  montelukast (SINGULAIR) 10 MG tablet Take 1 tablet (10 mg total) by mouth daily. Patient taking differently: Take 10 mg by mouth at bedtime as needed (breathing/allergies). 04/08/20   Ghimire, Henreitta Leber, MD  nitroGLYCERIN (NITROSTAT) 0.4 MG SL tablet Place 1 tablet (0.4 mg total) under the tongue every 5 (five) minutes as needed for chest pain. 04/08/20   Ghimire, Henreitta Leber, MD  OLANZapine zydis (ZYPREXA) 5 MG disintegrating tablet Take 1 tablet (5 mg total) by mouth at bedtime. 04/07/20   Ghimire, Henreitta Leber, MD  omeprazole (PRILOSEC) 40 MG capsule Take 1 capsule (40 mg total) by mouth in the morning and at bedtime. Please schedule a yearly follow up: [phone]. Thank you Patient not taking: Reported on 05/08/2020 04/08/20   Jonetta Osgood, MD  oxyCODONE-acetaminophen (PERCOCET/ROXICET) 5-325 MG tablet Take 1 tablet by mouth every 6 (six) hours as needed for severe pain. 06/20/20   Isla Pence, MD  OXYGEN Inhale 2 L into the lungs as needed. CPAP with oxygen at bedtime     [provider]  polyethylene glycol (MIRALAX) 17 g packet Take 17 g by mouth daily as needed (constipation.). Patient not taking: Reported on 05/08/2020 04/08/20   Jonetta Osgood, MD  Propylene Glycol (SYSTANE BALANCE) 0.6 % SOLN Place 1 drop into both eyes daily as needed (dry eyes).    [provider]  Vitamin D3 (VITAMIN D) 25 MCG tablet Take 1 tablet (1,000 Units total) by mouth daily. 04/08/20   Ghimire, Henreitta Leber, MD    Allergies    Morphine and related and Promethazine hcl  Review of Systems   Review of Systems  Unable to perform ROS: Dementia   Physical Exam Updated Vital Signs BP 140/71 (BP Location: Left Arm)   Pulse 86   Temp 98.5 F (36.9 C) (Oral)   Resp 16   SpO2 100%   Physical Exam Constitutional:      General: She is not in acute distress.    Appearance: Normal appearance.     Comments: Chronically ill appearing  HENT:     Head: Normocephalic and atraumatic.  Eyes:     Conjunctiva/sclera: Conjunctivae normal.  Cardiovascular:     Rate and Rhythm: Normal rate and regular rhythm.     Pulses: Normal pulses.  Heart sounds: Normal heart sounds. No murmur heard. Pulmonary:     Effort: Pulmonary effort is normal.     Breath sounds: Normal breath sounds.  Abdominal:     General: Abdomen is flat. There is no distension.     Palpations: Abdomen is soft.     Tenderness: There is no abdominal tenderness.  Musculoskeletal:     Right lower leg: No edema.     Left lower leg: No edema.     Comments: Mild b/l knee swelling w/ L knee surgical scar, pain with flexion/extension of knees and hips but able to demonstrate full passive ROM  Skin:    General: Skin is warm and dry.  Neurological:     Mental Status: She is alert.     Comments: Oriented x 1  Psychiatric:        Cognition and Memory: Memory is impaired.    ED Results / Procedures / Treatments   Labs (all labs ordered are listed, but only abnormal results are displayed) Labs  Reviewed  BASIC METABOLIC PANEL - Abnormal; Notable for the following components:      Result Value   Potassium 5.3 (*)    CO2 19 (*)    BUN 53 (*)    Creatinine, Ser 17.53 (*)    Calcium 7.8 (*)    GFR, Estimated 2 (*)    Anion gap 20 (*)    All other components within normal limits    EKG None  Radiology DG Chest 1 View  Result Date: 07/04/2020 CLINICAL DATA:  Fall EXAM: CHEST  1 VIEW COMPARISON:  Radiograph 06/20/2020 FINDINGS: There is a right neck catheter with tip overlying the right atrium. Unchanged, enlarged cardiac silhouette. There is no focal airspace consolidation. Elevated right hemidiaphragm, unchanged. There is no large pleural effusion or visible pneumothorax. There is no acute osseous abnormality. IMPRESSION: No evidence of acute cardiopulmonary disease. No radiographically evident acute trauma in the chest. Electronically Signed   By: Maurine Simmering   On: 07/04/2020 12:45   CT PELVIS WO CONTRAST  Result Date: 07/04/2020 CLINICAL DATA:  possible L ramus fracture, hip and knee pain bilaterally. Fall, right hip pain EXAM: CT PELVIS WITHOUT CONTRAST TECHNIQUE: Multidetector CT imaging of the pelvis was performed following the standard protocol without intravenous contrast. COMPARISON:  CT abdomen pelvis 09/05/2017 FINDINGS: Bones/Joint/Cartilage There is no evidence of acute fracture. There is moderate bilateral hip degenerative change. There is enthesophyte formation along the greater trochanters bilaterally consistent with chronic distal gluteal tendinopathy. There is mild bilateral SI joint degenerative change. There is L5-S1 disc height loss and mild to moderate bilateral facet arthritis at L4-L5 and L5-S1. Ligaments Suboptimally assessed by CT. Muscles and Tendons Mild gluteal muscle atrophy bilaterally. There is no focal fluid collection. Soft tissues Prior hysterectomy. Sigmoid diverticulosis. No other soft tissue abnormality. IMPRESSION: No evidence of acute fracture.  Moderate bilateral hip osteoarthritis. Electronically Signed   By: Maurine Simmering   On: 07/04/2020 13:54   DG HIP UNILAT WITH PELVIS 2-3 VIEWS LEFT  Result Date: 07/04/2020 CLINICAL DATA:  Fall, pain EXAM: DG HIP (WITH OR WITHOUT PELVIS) 2-3V LEFT; DG HIP (WITH OR WITHOUT PELVIS) 2-3V RIGHT COMPARISON:  Right hip radiograph 10/24/2016, left hip radiograph 01/06/2016. FINDINGS: There is a possible nondisplaced left superior pubic ramus/pubic body fracture versus projectional artifact. There is no evidence of femoral neck fracture. No evidence of right hip fracture. There is moderate bilateral hip degenerative arthritis. Lower lumbar spine and SI joint degenerative change.  There are enthesophytes bilaterally along the greater trochanters. Dystrophic calcification along the right gluteal soft tissues. IMPRESSION: Left hip: Possible nondisplaced left superior pubic ramus/pubic body fracture versus projectional artifact. No evidence of left proximal femur fracture. Moderate osteoarthritis of the left hip. Right hip: No evidence of acute fracture. Moderate osteoarthritis of the right hip. Electronically Signed   By: Maurine Simmering   On: 07/04/2020 12:44   DG Hip Unilat W or Wo Pelvis 2-3 Views Right  Result Date: 07/04/2020 CLINICAL DATA:  Fall, pain EXAM: DG HIP (WITH OR WITHOUT PELVIS) 2-3V LEFT; DG HIP (WITH OR WITHOUT PELVIS) 2-3V RIGHT COMPARISON:  Right hip radiograph 10/24/2016, left hip radiograph 01/06/2016. FINDINGS: There is a possible nondisplaced left superior pubic ramus/pubic body fracture versus projectional artifact. There is no evidence of femoral neck fracture. No evidence of right hip fracture. There is moderate bilateral hip degenerative arthritis. Lower lumbar spine and SI joint degenerative change. There are enthesophytes bilaterally along the greater trochanters. Dystrophic calcification along the right gluteal soft tissues. IMPRESSION: Left hip: Possible nondisplaced left superior pubic  ramus/pubic body fracture versus projectional artifact. No evidence of left proximal femur fracture. Moderate osteoarthritis of the left hip. Right hip: No evidence of acute fracture. Moderate osteoarthritis of the right hip. Electronically Signed   By: Maurine Simmering   On: 07/04/2020 12:44    Procedures Procedures   Medications Ordered in ED Medications  sodium zirconium cyclosilicate (LOKELMA) packet 10 g (10 g Oral Patient Refused/Not Given 07/04/20 1518)  HYDROcodone-acetaminophen (NORCO/VICODIN) 5-325 MG per tablet 1 tablet (1 tablet Oral Given 07/04/20 1205)    ED Course  I have reviewed the triage vital signs and the nursing notes.  Pertinent labs & imaging results that were available during my care of the patient were reviewed by me and considered in my medical decision making (see chart for details).    MDM Rules/Calculators/A&P                          I reviewed chart which shows recent XR R knee with osteoarthritic changes but negative acute. Obtained hip XR to r/u referred pain. XR shows ?L ramus fracture, CT negative for acute process. I suspect pain is related to osteoarthritis. Recommended ortho f/u given pain is limiting mobility.  Chest x-ray without significant volume overload, O2 saturation is normal on room air.  Potassium is only slightly elevated at 5.3.  Gave Lokelma for stabilization of potassium until patient can get to dialysis but she does not require emergent dialysis here in the ED.  Contacted outpatient dialysis coordinator for assistance.  I did contact social work as patient has been missing dialysis appointments that family members are supposed to be taking her to.  Apparently she has already had an APS case opened and the social worker will reach out to APS again to inform them of updates.  I discussed all of this over the phone with the patient's daughter as I was unable to reach patient's son or sister by phone. Final Clinical Impression(s) / ED Diagnoses Final  diagnoses:  Chronic pain of right knee  Non-compliance with renal dialysis [facility]    Rx / DC Orders ED Discharge Orders     None        Yoshika Vensel, Wenda Overland, MD 07/04/20 [phone]

## 2020-07-04 NOTE — ED Triage Notes (Signed)
Pt arrives via [facility] for right sided knee pain that started last Monday. Pt states tylenol and [place] rub has not helped. BP 138/78, 95% on room air. 77HR, 97.3 temp  Alert x2 per baseline, dialysis patient.

## 2020-07-04 NOTE — ED Notes (Signed)
[facility] called for transport.  

## 2020-07-04 NOTE — Discharge Instructions (Addendum)
IT IS VERY IMPORTANT FOR YOU TO GO TO DIALYSIS TOMORROW OR YOU COULD DEVELOP RESPIRATORY FAILURE OR HEART FAILURE AND DIE.  FOLLOW UP WITH ORTHOPEDIC CLINIC FOR YOUR KNEE PAIN.

## 2020-07-04 NOTE — Progress Notes (Signed)
Renal Navigator received call from Renal PA/K. Stovall, who reported to Navigator that she has spoken with patient's sister/Wanda. Per conversation with Renal PA, patient's family has realized that they are no longer able to manage patient at home. Navigator sent secure message through epic to EDP/Dr. Rex Kras once patient arrived to ED today to inform and ask if TOC can be involved. Navigator appreciative and will follow.  Patient currently dialyzes at [facility], TTS 12:15pm (arrive no later than 12:00pm).  Alphonzo Cruise, [place] Renal Navigator [phone]

## 2020-07-05 NOTE — ED Notes (Signed)
Called pt home and spoke with family member that pt is leaving with [facility].

## 2020-07-05 NOTE — ED Notes (Signed)
[facility] at the bedside transporting pt home.

## 2020-10-23 ENCOUNTER — Other Ambulatory Visit: Payer: Self-pay | Admitting: Family Medicine

## 2020-10-23 ENCOUNTER — Ambulatory Visit
Admission: RE | Admit: 2020-10-23 | Discharge: 2020-10-23 | Disposition: A | Discharge status: Home / Self Care | Payer: Medicare HMO | Source: Ambulatory Visit | Attending: Family Medicine | Admitting: Family Medicine

## 2020-10-23 DIAGNOSIS — R0989 Other specified symptoms and signs involving the circulatory and respiratory systems: Secondary | ICD-10-CM

## 2020-11-09 ENCOUNTER — Emergency Department (HOSPITAL_COMMUNITY): Payer: Medicare HMO

## 2020-11-09 ENCOUNTER — Inpatient Hospital Stay (HOSPITAL_COMMUNITY)
Admission: EM | Admit: 2020-11-09 | Discharge: 2020-11-24 | DRG: 175 | Disposition: A | Discharge status: Hospice | Payer: Medicare HMO | Attending: Internal Medicine | Admitting: Internal Medicine

## 2020-11-09 DIAGNOSIS — Z7189 Other specified counseling: Secondary | ICD-10-CM

## 2020-11-09 DIAGNOSIS — Z992 Dependence on renal dialysis: Secondary | ICD-10-CM

## 2020-11-09 DIAGNOSIS — R6520 Severe sepsis without septic shock: Secondary | ICD-10-CM | POA: Diagnosis present

## 2020-11-09 DIAGNOSIS — R4182 Altered mental status, unspecified: Secondary | ICD-10-CM | POA: Diagnosis not present

## 2020-11-09 DIAGNOSIS — R627 Adult failure to thrive: Secondary | ICD-10-CM | POA: Diagnosis not present

## 2020-11-09 DIAGNOSIS — F0390 Unspecified dementia without behavioral disturbance: Secondary | ICD-10-CM | POA: Diagnosis not present

## 2020-11-09 DIAGNOSIS — Z7401 Bed confinement status: Secondary | ICD-10-CM

## 2020-11-09 DIAGNOSIS — R6251 Failure to thrive (child): Secondary | ICD-10-CM | POA: Diagnosis not present

## 2020-11-09 DIAGNOSIS — Z20822 Contact with and (suspected) exposure to covid-19: Secondary | ICD-10-CM | POA: Diagnosis present

## 2020-11-09 DIAGNOSIS — J9621 Acute and chronic respiratory failure with hypoxia: Secondary | ICD-10-CM | POA: Diagnosis not present

## 2020-11-09 DIAGNOSIS — L8990 Pressure ulcer of unspecified site, unspecified stage: Secondary | ICD-10-CM | POA: Insufficient documentation

## 2020-11-09 DIAGNOSIS — E872 Acidosis, unspecified: Secondary | ICD-10-CM | POA: Diagnosis present

## 2020-11-09 DIAGNOSIS — Z8673 Personal history of transient ischemic attack (TIA), and cerebral infarction without residual deficits: Secondary | ICD-10-CM

## 2020-11-09 DIAGNOSIS — A419 Sepsis, unspecified organism: Secondary | ICD-10-CM | POA: Diagnosis not present

## 2020-11-09 DIAGNOSIS — D696 Thrombocytopenia, unspecified: Secondary | ICD-10-CM | POA: Diagnosis present

## 2020-11-09 DIAGNOSIS — E162 Hypoglycemia, unspecified: Secondary | ICD-10-CM

## 2020-11-09 DIAGNOSIS — R6510 Systemic inflammatory response syndrome (SIRS) of non-infectious origin without acute organ dysfunction: Secondary | ICD-10-CM | POA: Diagnosis present

## 2020-11-09 DIAGNOSIS — Z515 Encounter for palliative care: Secondary | ICD-10-CM | POA: Diagnosis not present

## 2020-11-09 DIAGNOSIS — Z96652 Presence of left artificial knee joint: Secondary | ICD-10-CM | POA: Diagnosis present

## 2020-11-09 DIAGNOSIS — I5032 Chronic diastolic (congestive) heart failure: Secondary | ICD-10-CM | POA: Diagnosis present

## 2020-11-09 DIAGNOSIS — Z833 Family history of diabetes mellitus: Secondary | ICD-10-CM

## 2020-11-09 DIAGNOSIS — F05 Delirium due to known physiological condition: Secondary | ICD-10-CM | POA: Diagnosis present

## 2020-11-09 DIAGNOSIS — Z8616 Personal history of COVID-19: Secondary | ICD-10-CM

## 2020-11-09 DIAGNOSIS — I2699 Other pulmonary embolism without acute cor pulmonale: Principal | ICD-10-CM | POA: Diagnosis present

## 2020-11-09 DIAGNOSIS — Z8249 Family history of ischemic heart disease and other diseases of the circulatory system: Secondary | ICD-10-CM

## 2020-11-09 DIAGNOSIS — R569 Unspecified convulsions: Secondary | ICD-10-CM | POA: Diagnosis present

## 2020-11-09 DIAGNOSIS — Z87891 Personal history of nicotine dependence: Secondary | ICD-10-CM

## 2020-11-09 DIAGNOSIS — I248 Other forms of acute ischemic heart disease: Secondary | ICD-10-CM | POA: Diagnosis present

## 2020-11-09 DIAGNOSIS — Z888 Allergy status to other drugs, medicaments and biological substances status: Secondary | ICD-10-CM

## 2020-11-09 DIAGNOSIS — R000 Tachycardia, unspecified: Secondary | ICD-10-CM | POA: Diagnosis present

## 2020-11-09 DIAGNOSIS — G4733 Obstructive sleep apnea (adult) (pediatric): Secondary | ICD-10-CM | POA: Diagnosis present

## 2020-11-09 DIAGNOSIS — E785 Hyperlipidemia, unspecified: Secondary | ICD-10-CM | POA: Diagnosis present

## 2020-11-09 DIAGNOSIS — E11649 Type 2 diabetes mellitus with hypoglycemia without coma: Secondary | ICD-10-CM | POA: Diagnosis present

## 2020-11-09 DIAGNOSIS — I2510 Atherosclerotic heart disease of native coronary artery without angina pectoris: Secondary | ICD-10-CM | POA: Diagnosis present

## 2020-11-09 DIAGNOSIS — Z66 Do not resuscitate: Secondary | ICD-10-CM | POA: Diagnosis present

## 2020-11-09 DIAGNOSIS — N2581 Secondary hyperparathyroidism of renal origin: Secondary | ICD-10-CM | POA: Diagnosis present

## 2020-11-09 DIAGNOSIS — R34 Anuria and oliguria: Secondary | ICD-10-CM | POA: Diagnosis present

## 2020-11-09 DIAGNOSIS — Z6837 Body mass index (BMI) 37.0-37.9, adult: Secondary | ICD-10-CM | POA: Diagnosis not present

## 2020-11-09 DIAGNOSIS — Z9981 Dependence on supplemental oxygen: Secondary | ICD-10-CM

## 2020-11-09 DIAGNOSIS — K219 Gastro-esophageal reflux disease without esophagitis: Secondary | ICD-10-CM | POA: Diagnosis present

## 2020-11-09 DIAGNOSIS — I509 Heart failure, unspecified: Secondary | ICD-10-CM | POA: Diagnosis not present

## 2020-11-09 DIAGNOSIS — Z832 Family history of diseases of the blood and blood-forming organs and certain disorders involving the immune mechanism: Secondary | ICD-10-CM

## 2020-11-09 DIAGNOSIS — Z7902 Long term (current) use of antithrombotics/antiplatelets: Secondary | ICD-10-CM

## 2020-11-09 DIAGNOSIS — Z841 Family history of disorders of kidney and ureter: Secondary | ICD-10-CM

## 2020-11-09 DIAGNOSIS — E1122 Type 2 diabetes mellitus with diabetic chronic kidney disease: Secondary | ICD-10-CM | POA: Diagnosis present

## 2020-11-09 DIAGNOSIS — Z79899 Other long term (current) drug therapy: Secondary | ICD-10-CM

## 2020-11-09 DIAGNOSIS — I959 Hypotension, unspecified: Secondary | ICD-10-CM | POA: Diagnosis present

## 2020-11-09 DIAGNOSIS — Z885 Allergy status to narcotic agent status: Secondary | ICD-10-CM

## 2020-11-09 DIAGNOSIS — I132 Hypertensive heart and chronic kidney disease with heart failure and with stage 5 chronic kidney disease, or end stage renal disease: Secondary | ICD-10-CM | POA: Diagnosis present

## 2020-11-09 DIAGNOSIS — R1310 Dysphagia, unspecified: Secondary | ICD-10-CM | POA: Diagnosis present

## 2020-11-09 DIAGNOSIS — D573 Sickle-cell trait: Secondary | ICD-10-CM | POA: Diagnosis present

## 2020-11-09 DIAGNOSIS — R630 Anorexia: Secondary | ICD-10-CM | POA: Diagnosis present

## 2020-11-09 DIAGNOSIS — D509 Iron deficiency anemia, unspecified: Secondary | ICD-10-CM | POA: Diagnosis present

## 2020-11-09 DIAGNOSIS — J9620 Acute and chronic respiratory failure, unspecified whether with hypoxia or hypercapnia: Secondary | ICD-10-CM

## 2020-11-09 DIAGNOSIS — R7989 Other specified abnormal findings of blood chemistry: Secondary | ICD-10-CM | POA: Diagnosis not present

## 2020-11-09 DIAGNOSIS — R55 Syncope and collapse: Secondary | ICD-10-CM

## 2020-11-09 DIAGNOSIS — E1142 Type 2 diabetes mellitus with diabetic polyneuropathy: Secondary | ICD-10-CM | POA: Diagnosis present

## 2020-11-09 DIAGNOSIS — J449 Chronic obstructive pulmonary disease, unspecified: Secondary | ICD-10-CM | POA: Diagnosis not present

## 2020-11-09 DIAGNOSIS — F03911 Unspecified dementia, unspecified severity, with agitation: Secondary | ICD-10-CM | POA: Diagnosis present

## 2020-11-09 DIAGNOSIS — I10 Essential (primary) hypertension: Secondary | ICD-10-CM | POA: Diagnosis not present

## 2020-11-09 DIAGNOSIS — T82594A Other mechanical complication of infusion catheter, initial encounter: Secondary | ICD-10-CM

## 2020-11-09 DIAGNOSIS — Z7982 Long term (current) use of aspirin: Secondary | ICD-10-CM

## 2020-11-09 DIAGNOSIS — N186 End stage renal disease: Secondary | ICD-10-CM | POA: Diagnosis present

## 2020-11-09 LAB — CBC WITH DIFFERENTIAL/PLATELET
Abs Immature Granulocytes: 0.3 10*3/uL — ABNORMAL HIGH (ref 0.00–0.07)
Band Neutrophils: 9 %
Basophils Absolute: 0 10*3/uL (ref 0.0–0.1)
Basophils Relative: 0 %
Eosinophils Absolute: 0 10*3/uL (ref 0.0–0.5)
Eosinophils Relative: 0 %
HCT: 31.1 % — ABNORMAL LOW (ref 36.0–46.0)
Hemoglobin: 9.9 g/dL — ABNORMAL LOW (ref 12.0–15.0)
Lymphocytes Relative: 8 %
Lymphs Abs: 1 10*3/uL (ref 0.7–4.0)
MCH: 23.6 pg — ABNORMAL LOW (ref 26.0–34.0)
MCHC: 31.8 g/dL (ref 30.0–36.0)
MCV: 74 fL — ABNORMAL LOW (ref 80.0–100.0)
Metamyelocytes Relative: 1 %
Monocytes Absolute: 0.5 10*3/uL (ref 0.1–1.0)
Monocytes Relative: 4 %
Myelocytes: 1 %
Neutro Abs: 11.2 10*3/uL — ABNORMAL HIGH (ref 1.7–7.7)
Neutrophils Relative %: 77 %
Platelets: 183 10*3/uL (ref 150–400)
RBC: 4.2 MIL/uL (ref 3.87–5.11)
RDW: 26.8 % — ABNORMAL HIGH (ref 11.5–15.5)
WBC: 13 10*3/uL — ABNORMAL HIGH (ref 4.0–10.5)
nRBC: 2.8 % — ABNORMAL HIGH (ref 0.0–0.2)
nRBC: 8 /100 WBC — ABNORMAL HIGH

## 2020-11-09 LAB — CBG MONITORING, ED
Glucose-Capillary: 138 mg/dL — ABNORMAL HIGH (ref 70–99)
Glucose-Capillary: 14 mg/dL — CL (ref 70–99)
Glucose-Capillary: 214 mg/dL — ABNORMAL HIGH (ref 70–99)

## 2020-11-09 LAB — LACTIC ACID, PLASMA
Lactic Acid, Venous: 8.4 mmol/L (ref 0.5–1.9)
Lactic Acid, Venous: 6.9 mmol/L (ref 0.5–1.9)

## 2020-11-09 LAB — COMPREHENSIVE METABOLIC PANEL
ALT: 12 U/L (ref 0–44)
AST: 26 U/L (ref 15–41)
Albumin: 2.3 g/dL — ABNORMAL LOW (ref 3.5–5.0)
Alkaline Phosphatase: 94 U/L (ref 38–126)
Anion gap: 18 — ABNORMAL HIGH (ref 5–15)
BUN: 8 mg/dL (ref 8–23)
CO2: 19 mmol/L — ABNORMAL LOW (ref 22–32)
Calcium: 7.6 mg/dL — ABNORMAL LOW (ref 8.9–10.3)
Chloride: 101 mmol/L (ref 98–111)
Creatinine, Ser: 3.03 mg/dL — ABNORMAL HIGH (ref 0.44–1.00)
GFR, Estimated: 16 mL/min — ABNORMAL LOW (ref >60)
Glucose, Bld: 172 mg/dL — ABNORMAL HIGH (ref 70–99)
Potassium: 3.9 mmol/L (ref 3.5–5.1)
Sodium: 138 mmol/L (ref 135–145)
Total Bilirubin: 1 mg/dL (ref 0.3–1.2)
Total Protein: 5.6 g/dL — ABNORMAL LOW (ref 6.5–8.1)

## 2020-11-09 LAB — TROPONIN I (HIGH SENSITIVITY)
Troponin I (High Sensitivity): 27 ng/L — ABNORMAL HIGH (ref <18)
Troponin I (High Sensitivity): 25 ng/L — ABNORMAL HIGH (ref <18)

## 2020-11-09 LAB — RESP PANEL BY RT-PCR (FLU A&B, COVID) ARPGX2
Influenza A by PCR: NEGATIVE
Influenza B by PCR: NEGATIVE
SARS Coronavirus 2 by RT PCR: NEGATIVE

## 2020-11-09 MED ORDER — DEXTROSE 50 % IV SOLN
12.5000 g | INTRAVENOUS | Status: DC | PRN
Start: 2020-11-09 — End: 2020-11-23

## 2020-11-09 MED ORDER — SODIUM CHLORIDE 0.9 % IV SOLN
1.0000 g | INTRAVENOUS | Status: DC
  Administered 2020-11-09 – 2020-11-11 (×3): 1 g via INTRAVENOUS
  Filled 2020-11-09 (×4): qty 1

## 2020-11-09 MED ORDER — SODIUM CHLORIDE 0.9 % IV BOLUS
500.0000 mL | Freq: Once | INTRAVENOUS | Status: AC
  Administered 2020-11-09: 500 mL via INTRAVENOUS

## 2020-11-09 MED ORDER — VANCOMYCIN HCL 2000 MG/400ML IV SOLN
2000.0000 mg | Freq: Once | INTRAVENOUS | Status: AC
  Administered 2020-11-09: 2000 mg via INTRAVENOUS
  Filled 2020-11-09: qty 400

## 2020-11-09 MED ORDER — VANCOMYCIN VARIABLE DOSE PER UNSTABLE RENAL FUNCTION (PHARMACIST DOSING): Status: DC

## 2020-11-09 MED ORDER — DEXTROSE 50 % IV SOLN
INTRAVENOUS | Status: AC
  Filled 2020-11-09: qty 50

## 2020-11-09 NOTE — ED Triage Notes (Signed)
Pt via [facility] from dialysis, where pt completed full treatment but no fluid off. After tx, pt had witnessed syncopal/seizure activity appox 1600. Pt received chest compressions due to apneic period. Pt A&O, able to acknowledge Y/N, altered & "not at baseline." CBG 53 w fire, after glucogon 44. BP 53/27, 110/84 on arrival. Per family, pt potentially FTT?  1L + approx 52mL from EMS

## 2020-11-09 NOTE — ED Provider Notes (Signed)
[facility] EMERGENCY DEPARTMENT Provider Note   CSN: [id number] Arrival date & time: 11/09/20  1713     History Chief Complaint  Patient presents with   Altered Mental Status    Joann Gutierrez is a 75 y.o. female.  HPI This is a 75 year old female with extensive past medical history below including ESRD on dialysis, CAD, CHF, COPD on home O2, diabetes, prior stroke, and dementia who presents with altered mental status and hypotension.  Patient was reportedly at dialysis when she became altered.  Was reportedly not breathing and chest compressions were initiated but then quickly stopped when patient pushed compressor off.     Past Medical History:  Diagnosis Date   Adenomatous colon polyp    Allergy    Anemia    Asthma        CAD (coronary artery disease)    Mild very minimal coronary disease with 20% obtuse marginal stenosis   Carpal tunnel syndrome on left    Cataract    CHF (congestive heart failure) (HCC)    Chronic kidney disease (CKD), stage III (moderate) (HCC)    now stage 4   COPD (chronic obstructive pulmonary disease) (HCC)    Dementia with behavioral disturbance ([place]) 03/09/2020   Depression    Diabetes mellitus 1997   Type II    Diverticulosis    Dyspnea    Elevated diaphragm November 2011   Right side   Esophageal dysmotility    Esophageal stricture    ESRD on hemodialysis ([place]) 03/09/2020   Gastritis    Gastroparesis 08/21/2007   GERD (gastroesophageal reflux disease)    Hearing loss of both ears    Hernia, hiatal    Hyperlipidemia    Hypertension    Morbid obesity ([place])    OSTEOARTHRITIS 08/09/2006   Osteoporosis    Oxygen deficiency    prn    PERIPHERAL NEUROPATHY, FEET 09/23/2007   RENAL INSUFFICIENCY 02/16/2009   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   Seizures ([place])    pt thinks it has been several monthes since she had a seisure   Shingles    Sickle cell trait ([place])    Sleep apnea    uses cpap   Stroke [facility])      Patient Active Problem List   Diagnosis Date Noted   Delirium 04/10/2020   Adult failure to thrive 04/10/2020   Acute bilat watershed infarction [facility]) 03/31/2020   AMS (altered mental status) 41/74/0814   Acute metabolic encephalopathy 48/18/5631   ESRD on hemodialysis ([place]) 03/09/2020   Thrombocytopenia ([place]) 03/09/2020   Dementia with behavioral disturbance 03/09/2020   COVID-19    SOB (shortness of breath)    Type 2 diabetes mellitus with hyperlipidemia ([place])    Sepsis ([place]) 02/16/2020   Acute on chronic respiratory failure with hypoxia ([place]) 02/16/2020   Edema of left lower leg 02/16/2020   Pneumonia due to COVID-19 virus 02/15/2020   Acute on chronic diastolic HF (heart failure) ([place]) 11/18/2019   Dyslipidemia 11/18/2019   Polypharmacy 04/13/2019   Healthcare maintenance 10/15/2017   ARF (acute renal failure) ([place]) 09/07/2017   Chest pain 09/05/2017   Ineffective health maintenance 06/26/2017   Medication management 06/26/2017   Retinal edema 10/07/2016   Postherpetic neuralgia 12/21/2015   Herpes zoster with complication 49/70/2637   Cellulitis of face 11/27/2015   Anemia of chronic disease 08/17/2015   History of colonic polyps    Hypertensive emergency 05/20/2015   Hypokalemia 05/20/2015   AKI (acute kidney  injury) ([place])    Esophageal dysmotility 03/01/2015   Throat congestion 10/11/2014   Morbid obesity due to excess calories ([place]) 09/23/2014   Acute asthmatic bronchitis 05/20/2014   Abdominal pain, other specified site 09/13/2013   Elevated liver enzymes 09/13/2013   Cough 05/05/2013   Pain in lower limb 03/05/2013   DOE (dyspnea on exertion) 02/03/2013   Chronic respiratory failure ([place]) 12/13/2012   Stricture and stenosis of esophagus 08/31/2012   Onychomycosis 06/01/2012   Pain in joint, ankle and foot 06/01/2012   Diabetes mellitus type 2 in obese ([place]) 04/29/2012   Essential hypertension 07/19/2011   CKD (chronic kidney disease), stage III ([place])  05/19/2011   Family history of malignant neoplasm of gastrointestinal tract 10/30/2010   Bloating 10/16/2010   Epigastric pain 10/16/2010   Foot pain 09/25/2010   Dysphagia 07/16/2010   GERD (gastroesophageal reflux disease) 07/16/2010   Microcytic anemia 07/16/2010   Obstructive sleep apnea 06/16/2009   HYPERCHOLESTEROLEMIA 02/16/2009   PERIPHERAL NEUROPATHY, FEET 09/23/2007   Gastroparesis 08/21/2007   Chronic obstructive asthma ([place]) 08/09/2006    Past Surgical History:  Procedure Laterality Date   ABDOMINAL HYSTERECTOMY     ARTERY BIOPSY  01/07/2011   Procedure: MINOR BIOPSY TEMPORAL ARTERY;  Surgeon: Haywood Lasso, MD;  Location: [facility];  Service: General;  Laterality: Left;  left temporal artery biopsy   AV FISTULA PLACEMENT Right 11/16/2018   Procedure: CREATION RIGHT BRACHIOCEPHALIC FISTULA  ARTERIOVENOUS FISTULA;  Surgeon: Angelia Mould, MD;  Location: [facility] OR;  Service: Vascular;  Laterality: Right;   bil foot surgery     BREAST LUMPECTOMY     benign   BREAST LUMPECTOMY     both breast lumps removed    CATARACT EXTRACTION Left 06/2016   Dr. Read Drivers   COLONOSCOPY     COLONOSCOPY WITH PROPOFOL N/A 07/05/2015   Procedure: COLONOSCOPY WITH PROPOFOL;  Surgeon: Manus Gunning, MD;  Location: [facility];  Service: Gastroenterology;  Laterality: N/A;   COLONOSCOPY WITH PROPOFOL N/A 08/04/2018   Procedure: COLONOSCOPY WITH PROPOFOL;  Surgeon: Yetta Flock, MD;  Location: [facility];  Service: Gastroenterology;  Laterality: N/A;   ERD  08/08/2000   ESOPHAGEAL MANOMETRY N/A 03/13/2015   Procedure: ESOPHAGEAL MANOMETRY (EM);  Surgeon: Manus Gunning, MD;  Location: [facility];  Service: Gastroenterology;  Laterality: N/A;   ESOPHAGOGASTRODUODENOSCOPY  06/25/2006   ESOPHAGOGASTRODUODENOSCOPY (EGD) WITH PROPOFOL N/A 07/05/2015   Procedure: ESOPHAGOGASTRODUODENOSCOPY (EGD) WITH PROPOFOL;  Surgeon: Manus Gunning, MD;   Location: [facility];  Service: Gastroenterology;  Laterality: N/A;   FISTULA SUPERFICIALIZATION Right 11/16/2018   Procedure: Fistula Superficialization;  Surgeon: Angelia Mould, MD;  Location: [facility] OR;  Service: Vascular;  Laterality: Right;   HERNIA REPAIR     IR PERC TUN PERIT CATH WO PORT S&I /IMAG  02/17/2020   IR US GUIDE VASC ACCESS RIGHT  02/18/2020   LEFT AND RIGHT HEART CATHETERIZATION WITH CORONARY ANGIOGRAM N/A 03/03/2013   Procedure: LEFT AND RIGHT HEART CATHETERIZATION WITH CORONARY ANGIOGRAM;  Surgeon: Minus Breeding, MD;  Location: [facility] CATH LAB;  Service: Cardiovascular;  Laterality: N/A;   REPLACEMENT TOTAL KNEE Left 1998   UPPER GASTROINTESTINAL ENDOSCOPY       OB History   No obstetric history on file.     Family History  Problem Relation Age of Onset   Liver cancer Mother        Liver Cancer   Diabetes Mother    Kidney disease Mother  Heart disease Mother        age 92's   Heart disease Father 24       MI   Heart attack Father        died of MI when pt was 5   Sickle cell anemia Father    Colon cancer Brother    Cancer Brother        Colon Cancer   Diabetes Sister    Kidney disease Sister    Heart disease Sister        age 57's   Allergies Sister    Diabetes Sister    Kidney disease Sister    Heart disease Sister        age 38's   Esophageal cancer Neg Hx    Rectal cancer Neg Hx    Stomach cancer Neg Hx    Amblyopia Neg Hx    Blindness Neg Hx    Glaucoma Neg Hx    Macular degeneration Neg Hx    Retinal detachment Neg Hx    Cataracts Neg Hx    Strabismus Neg Hx    Retinitis pigmentosa Neg Hx     Social History   Tobacco Use   Smoking status: Former    Packs/day: 0.50    Years: 10.00    Pack years: 5.00    Types: Cigarettes    Quit date: 04/18/1980    Years since quitting: 40.5   Smokeless tobacco: Never  Vaping Use   Vaping Use: Never used  Substance Use Topics   Alcohol use: No    Alcohol/week: 0.0 standard drinks    Drug use: No    Home Medications Prior to Admission medications   Medication Sig Start Date End Date Taking? Authorizing Provider  acetaminophen (TYLENOL) 325 MG tablet Take 650 mg by mouth every 4 (four) hours as needed for moderate pain.    [provider]  albuterol (VENTOLIN HFA) 108 (90 Base) MCG/ACT inhaler Inhale 2 puffs into the lungs every 4 (four) hours as needed for wheezing or shortness of breath. 04/08/20   Ghimire, Henreitta Leber, MD  ascorbic acid (VITAMIN C) 500 MG tablet Take 1 tablet (500 mg total) by mouth daily. Patient not taking: Reported on 05/08/2020 04/08/20   Jonetta Osgood, MD  aspirin EC 81 MG tablet Take 1 tablet (81 mg total) by mouth daily. For 2 weeks-along with Plavix-after 2 weeks-stop aspirin. 04/08/20   Ghimire, Henreitta Leber, MD  azelastine (ASTELIN) 0.1 % nasal spray Place 1 spray into both nostrils 2 (two) times daily as needed (allergies). Use in each nostril as directed 04/08/20   Ghimire, Henreitta Leber, MD  bisacodyl (DULCOLAX) 5 MG EC tablet Take 1 tablet (5 mg total) by mouth daily as needed for moderate constipation. 04/08/20   Ghimire, Henreitta Leber, MD  calcium carbonate (OSCAL) 1500 (600 Ca) MG TABS tablet Take 1 tablet (1,500 mg total) by mouth daily. 04/08/20   Ghimire, Henreitta Leber, MD  clopidogrel (PLAVIX) 75 MG tablet Take 1 tablet (75 mg total) by mouth daily. 04/08/20   Ghimire, Henreitta Leber, MD  donepezil (ARICEPT) 5 MG tablet Take 1 tablet (5 mg total) by mouth at bedtime. 04/08/20   Ghimire, Henreitta Leber, MD  ezetimibe (ZETIA) 10 MG tablet Take 1 tablet (10 mg total) by mouth daily. 04/08/20   Ghimire, Henreitta Leber, MD  ferrous sulfate 325 (65 FE) MG tablet Take 1 tablet (325 mg total) by mouth daily. Patient not taking: Reported on 05/08/2020 04/08/20  Ghimire, Henreitta Leber, MD  Fluticasone-Umeclidin-Vilant (TRELEGY ELLIPTA) 100-62.5-25 MCG/INH AEPB Take 1 puff by mouth daily. 04/08/20   Ghimire, Henreitta Leber, MD  guaiFENesin-dextromethorphan (ROBITUSSIN DM) 100-10 MG/5ML  syrup Take 10 mLs by mouth every 4 (four) hours as needed for cough. 03/02/20   Sheikh, Omair Latif, DO  Ipratropium-Albuterol (COMBIVENT) 20-100 MCG/ACT AERS respimat Inhale 1 puff into the lungs every 6 (six) hours as needed for wheezing or shortness of breath. 04/08/20 05/08/20  Ghimire, Henreitta Leber, MD  lactose free nutrition (BOOST) LIQD Take 237 mLs by mouth in the morning and at bedtime.    [provider]  lip balm (CARMEX) ointment Apply topically as needed for lip care. 03/02/20   Sheikh, Omair Latif, DO  montelukast (SINGULAIR) 10 MG tablet Take 1 tablet (10 mg total) by mouth daily. Patient taking differently: Take 10 mg by mouth at bedtime as needed (breathing/allergies). 04/08/20   Ghimire, Henreitta Leber, MD  nitroGLYCERIN (NITROSTAT) 0.4 MG SL tablet Place 1 tablet (0.4 mg total) under the tongue every 5 (five) minutes as needed for chest pain. 04/08/20   Ghimire, Henreitta Leber, MD  OLANZapine zydis (ZYPREXA) 5 MG disintegrating tablet Take 1 tablet (5 mg total) by mouth at bedtime. 04/07/20   Ghimire, Henreitta Leber, MD  omeprazole (PRILOSEC) 40 MG capsule Take 1 capsule (40 mg total) by mouth in the morning and at bedtime. Please schedule a yearly follow up: [phone]. Thank you Patient not taking: Reported on 05/08/2020 04/08/20   Jonetta Osgood, MD  oxyCODONE-acetaminophen (PERCOCET/ROXICET) 5-325 MG tablet Take 1 tablet by mouth every 6 (six) hours as needed for severe pain. 06/20/20   Isla Pence, MD  OXYGEN Inhale 2 L into the lungs as needed. CPAP with oxygen at bedtime    [provider]  polyethylene glycol (MIRALAX) 17 g packet Take 17 g by mouth daily as needed (constipation.). Patient not taking: Reported on 05/08/2020 04/08/20   Jonetta Osgood, MD  Propylene Glycol (SYSTANE BALANCE) 0.6 % SOLN Place 1 drop into both eyes daily as needed (dry eyes).    [provider]  Vitamin D3 (VITAMIN D) 25 MCG tablet Take 1 tablet (1,000 Units total) by mouth daily. 04/08/20    Ghimire, Henreitta Leber, MD    Allergies    Morphine and related and Promethazine hcl  Review of Systems   Review of Systems  Unable to perform ROS: Dementia   Physical Exam Updated Vital Signs BP (!) 165/138 (BP Location: Left Arm)   Pulse (!) 138   Temp (!) 96.5 F (35.8 C) (Axillary)   Resp (!) 21   SpO2 97%   Physical Exam Vitals and nursing note reviewed.  Constitutional:      General: She is not in acute distress.    Appearance: She is well-developed.  HENT:     Head: Normocephalic and atraumatic.  Eyes:     Conjunctiva/sclera: Conjunctivae normal.  Cardiovascular:     Rate and Rhythm: Normal rate and regular rhythm.     Heart sounds: No murmur heard. Pulmonary:     Effort: Pulmonary effort is normal. No respiratory distress.     Breath sounds: Normal breath sounds.  Abdominal:     Palpations: Abdomen is soft.     Tenderness: There is no abdominal tenderness.  Musculoskeletal:     Cervical back: Neck supple.  Skin:    General: Skin is warm and dry.     Comments: No wounds. Dialysis site c/d/I.   Neurological:  Mental Status: She is lethargic.     GCS: GCS eye subscore is 4. GCS verbal subscore is 3. GCS motor subscore is 5.     Comments: Does not follow commands or answer questions appropriately.     ED Results / Procedures / Treatments   Labs (all labs ordered are listed, but only abnormal results are displayed) Labs Reviewed  CBC WITH DIFFERENTIAL/PLATELET - Abnormal; Notable for the following components:      Result Value   WBC 13.0 (*)    Hemoglobin 9.9 (*)    HCT 31.1 (*)    MCV 74.0 (*)    MCH 23.6 (*)    RDW 26.8 (*)    nRBC 2.8 (*)    Neutro Abs 11.2 (*)    nRBC 8 (*)    Abs Immature Granulocytes 0.30 (*)    All other components within normal limits  COMPREHENSIVE METABOLIC PANEL - Abnormal; Notable for the following components:   CO2 19 (*)    Glucose, Bld 172 (*)    Creatinine, Ser 3.03 (*)    Calcium 7.6 (*)    Total Protein 5.6  (*)    Albumin 2.3 (*)    GFR, Estimated 16 (*)    Anion gap 18 (*)    All other components within normal limits  LACTIC ACID, PLASMA - Abnormal; Notable for the following components:   Lactic Acid, Venous 8.4 (*)    All other components within normal limits  LACTIC ACID, PLASMA - Abnormal; Notable for the following components:   Lactic Acid, Venous 6.9 (*)    All other components within normal limits  CBG MONITORING, ED - Abnormal; Notable for the following components:   Glucose-Capillary 14 (*)    All other components within normal limits  CBG MONITORING, ED - Abnormal; Notable for the following components:   Glucose-Capillary 214 (*)    All other components within normal limits  TROPONIN I (HIGH SENSITIVITY) - Abnormal; Notable for the following components:   Troponin I (High Sensitivity) 27 (*)    All other components within normal limits  TROPONIN I (HIGH SENSITIVITY) - Abnormal; Notable for the following components:   Troponin I (High Sensitivity) 25 (*)    All other components within normal limits  RESP PANEL BY RT-PCR (FLU A&B, COVID) ARPGX2  CULTURE, BLOOD (ROUTINE X 2)  CULTURE, BLOOD (ROUTINE X 2)  PATHOLOGIST SMEAR REVIEW  CBG MONITORING, ED    EKG None  Radiology CT Head Wo Contrast  Result Date: 11/09/2020 CLINICAL DATA:  Mental status change, hypertensive emergency EXAM: CT HEAD WITHOUT CONTRAST TECHNIQUE: Contiguous axial images were obtained from the base of the skull through the vertex without intravenous contrast. COMPARISON:  05/11/2020 FINDINGS: Brain: There is atrophy and chronic small vessel disease changes. No acute intracranial abnormality. Specifically, no hemorrhage, hydrocephalus, mass lesion, acute infarction, or significant intracranial injury. Vascular: No hyperdense vessel or unexpected calcification. Skull: No acute calvarial abnormality. Sinuses/Orbits: No acute findings Other: None IMPRESSION: Atrophy, chronic microvascular disease. No acute  intracranial abnormality. Electronically Signed   By: Rolm Baptise M.D.   On: 11/09/2020 20:26   DG CHEST PORT 1 VIEW  Result Date: 11/09/2020 CLINICAL DATA:  Syncope, seizure EXAM: PORTABLE CHEST 1 VIEW COMPARISON:  09/23/2020 FINDINGS: Right dialysis catheter remains in stable position with the tip in the lower right atrium. Heart is normal size. Lungs clear. No effusions or pneumothorax. No acute bony abnormality. IMPRESSION: No active disease. Electronically Signed   By: Lennette Bihari  [place] M.D.   On: 11/09/2020 18:10    Procedures Procedures   Medications Ordered in ED Medications - No data to display  ED Course  I have reviewed the triage vital signs and the nursing notes.  Pertinent labs & imaging results that were available during my care of the patient were reviewed by me and considered in my medical decision making (see chart for details).    MDM Rules/Calculators/A&P                          On arrival patient is tachycardic and hypertensive. She is lethargic but moves all extremities equally. Does not follow commands. Neurologic baseline initially unknown. POC glucose 14. This is likely cause of symptoms and prior syncopal episode. Given glucagon in the field reportedly but remains very hypoglycemic. Given D50 with improvement to >200. Mental status improved and patient now alert and answers questions but inappropriately. Husband now at bedside who reports this is her baseline. He reports she has had poor po intake for several weeks. No insulin or other diabetes meds given.  Other items on the ddx for syncope and AMS include ACS, infection, polypharmacy, volume depletion given recent dialysis, or stroke.   On re-evaluation patient develops hypotension with systolic BP 10F. Lactate 8. Already received 1000 mL of fluids by EMS. Concern for volume overload in dialysis patient who does not produce urine. Will give additional 500 cc and reassess. Blood cultures ordered and broad spectrum  abx given. No clear infectious source, CXR negative for infection or other cardiopulmonary abnormality. Patient is on 2L O2 by nasal cannula but husband reports she intermittently is on this at home. No clear cardiopulmonary process. EKG without acute ischemia and and troponin is 25, likely elevated 2/2 demand ischemia and ESRD.    On reassessment BP is improved. Lactate improved to 6.9. Mental status remains at baseline and recheck of glucose 138. Remains tachycardic to 116. Patient admitted for hypoglycemia due to poor po/failure to thrive and sepsis in stable condition.   Final Clinical Impression(s) / ED Diagnoses Final diagnoses:  Syncope  Hypoglycemia  Lactic acidosis  Altered mental status, unspecified altered mental status type    Rx / DC Orders ED Discharge Orders     None        Coralee Pesa, MD 11/10/20 0001    Lucrezia Starch, MD 11/10/20 1538

## 2020-11-09 NOTE — ED Notes (Signed)
Pt placed on 3L [place]

## 2020-11-09 NOTE — ED Notes (Signed)
CBG 14, VRBO emergency amp of D50

## 2020-11-09 NOTE — Progress Notes (Signed)
Pharmacy Antibiotic Note  Joann Gutierrez is a 75 y.o. female admitted on 11/09/2020 with bacteremia.  Pharmacy has been consulted for cefepime and vancomycin dosing. Patient is ESRD.  Patient presenting following witnessed syncopal/seizure activity  following HD session today.  Plan: Cefepime 1g q24h Vancomycin 2000mg  once - dosing to follow to be determined by HD schedule Trend WBC, fever, clinical course F/u nephrology plan and cultures De-escalate when able     Temp (24hrs), Avg:97.2 F (36.2 C), Min:96.5 F (35.8 C), Max:97.8 F (36.6 C)  Recent Labs  Lab 11/09/20 1728 11/09/20 1729  WBC 13.0*  --   CREATININE 3.03*  --   LATICACIDVEN  --  8.4*    CrCl cannot be calculated (Unknown ideal weight.).    Allergies  Allergen Reactions   Morphine And Related Other (See Comments)    Family request not to be given, reports pt does not wake up when given    Promethazine Hcl Other (See Comments)    REACTION: lethargy    Antimicrobials this admission: cefepime 10/20 >>  vancomycin 10/20 >>   Microbiology results: Pending   Thank you for allowing pharmacy to be a part of this patient's care.  Heloise Purpura 11/09/2020 7:34 PM

## 2020-11-09 NOTE — H&P (Addendum)
History and Physical    Joann Gutierrez [id number]:[id number] DOB: 09/20/1945 DOA: 11/09/2020  PCP: Andree Moro, DO Patient coming from: Dialysis center  Chief Complaint: Hypotension, syncope  HPI: Joann Gutierrez is a 75 y.o. female with medical history significant of ESRD on HD TTS, anemia, asthma, CAD, chronic diastolic CHF, pulmonary hypertension, COPD, advanced dementia, chronic ambulatory dysfunction, depression, type 2 diabetes not on insulin, peripheral neuropathy, hypertension, hyperlipidemia, seizures, chronic respiratory failure on home oxygen, OSA on CPAP, CVA.  She presented to the ED today via EMS from her dialysis center where she completed full treatment but no fluid was taken off.  After treatment patient had a witnessed syncopal episode with seizure like activity.  Brief episode of apnea?, chest compressions started after which patient woke up.  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  Repeat CBG 44.  Hypotensive with blood pressure 53/27 with EMS and was given 1 L fluid bolus.  Upon arrival to the ED, initial blood pressure improved but subsequently became hypotensive again with blood pressure 82/54.  Tachycardic but not febrile.  She was given additional 500 cc fluid bolus after which blood pressure improved.  Labs notable for WBC 13.0, hemoglobin 9.9 (stable), platelet count 183k.  Sodium 138, potassium 3.9, chloride 101, bicarb 19, BUN 8, creatinine 3.0, glucose 172.  LFTs normal.  High-sensitivity troponin mildly elevated but stable 27 > 25.  EKG without acute ischemic changes.  Initial lactic acid 8.4, improved to 6.9 after fluid bolus.  Blood culture x2 pending.  COVID and influenza PCR negative.  Patient subsequently became hypoglycemic again with CBG 14.  She was given 1 amp of D50 and CBG improved to 214.  Chest x-ray showing no active disease.  Head CT negative for acute intracranial abnormality. Patient was given vancomycin and  cefepime.  Patient is not able to give any history.  Patient's boyfriend at bedside states she passed out at dialysis today and they thought she had a seizure as her eyes rolled back.  They then gave her oxygen and did CPR on her after which she woke up.  She had diarrhea a few days ago which has resolved.  She has not eaten for the past 2 days.  No nausea or vomiting. She has advanced dementia and is quite confused at baseline.  She would not know things such as where she is or the date.  She was previously on 2 L home oxygen but has not required it in a while.  Also currently not using CPAP.  Review of Systems:  All systems reviewed and apart from history of presenting illness, are negative.  Past Medical History:  Diagnosis Date   Adenomatous colon polyp    Allergy    Anemia    Asthma        CAD (coronary artery disease)    Mild very minimal coronary disease with 20% obtuse marginal stenosis   Carpal tunnel syndrome on left    Cataract    CHF (congestive heart failure) (HCC)    Chronic kidney disease (CKD), stage III (moderate) (HCC)    now stage 4   COPD (chronic obstructive pulmonary disease) ([place])    Dementia with behavioral disturbance ([place]) 03/09/2020   Depression    Diabetes mellitus 1997   Type II    Diverticulosis    Dyspnea    Elevated diaphragm November 2011   Right side   Esophageal dysmotility    Esophageal stricture  ESRD on hemodialysis ([place]) 03/09/2020   Gastritis    Gastroparesis 08/21/2007   GERD (gastroesophageal reflux disease)    Hearing loss of both ears    Hernia, hiatal    Hyperlipidemia    Hypertension    Morbid obesity ([place])    OSTEOARTHRITIS 08/09/2006   Osteoporosis    Oxygen deficiency    prn    PERIPHERAL NEUROPATHY, FEET 09/23/2007   RENAL INSUFFICIENCY 02/16/2009   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   Seizures ([place])    pt thinks it has been several monthes since she had a seisure   Shingles    Sickle cell trait ([place])    Sleep  apnea    uses cpap   Stroke [facility])     Past Surgical History:  Procedure Laterality Date   ABDOMINAL HYSTERECTOMY     ARTERY BIOPSY  01/07/2011   Procedure: MINOR BIOPSY TEMPORAL ARTERY;  Surgeon: Haywood Lasso, MD;  Location: [place];  Service: General;  Laterality: Left;  left temporal artery biopsy   AV FISTULA PLACEMENT Right 11/16/2018   Procedure: CREATION RIGHT BRACHIOCEPHALIC FISTULA  ARTERIOVENOUS FISTULA;  Surgeon: Angelia Mould, MD;  Location: [facility] OR;  Service: Vascular;  Laterality: Right;   bil foot surgery     BREAST LUMPECTOMY     benign   BREAST LUMPECTOMY     both breast lumps removed    CATARACT EXTRACTION Left 06/2016   Dr. Read Drivers   COLONOSCOPY     COLONOSCOPY WITH PROPOFOL N/A 07/05/2015   Procedure: COLONOSCOPY WITH PROPOFOL;  Surgeon: Manus Gunning, MD;  Location: [facility];  Service: Gastroenterology;  Laterality: N/A;   COLONOSCOPY WITH PROPOFOL N/A 08/04/2018   Procedure: COLONOSCOPY WITH PROPOFOL;  Surgeon: Yetta Flock, MD;  Location: [facility] ENDOSCOPY;  Service: Gastroenterology;  Laterality: N/A;   ERD  08/08/2000   ESOPHAGEAL MANOMETRY N/A 03/13/2015   Procedure: ESOPHAGEAL MANOMETRY (EM);  Surgeon: Manus Gunning, MD;  Location: [facility] ENDOSCOPY;  Service: Gastroenterology;  Laterality: N/A;   ESOPHAGOGASTRODUODENOSCOPY  06/25/2006   ESOPHAGOGASTRODUODENOSCOPY (EGD) WITH PROPOFOL N/A 07/05/2015   Procedure: ESOPHAGOGASTRODUODENOSCOPY (EGD) WITH PROPOFOL;  Surgeon: Manus Gunning, MD;  Location: [facility] ENDOSCOPY;  Service: Gastroenterology;  Laterality: N/A;   FISTULA SUPERFICIALIZATION Right 11/16/2018   Procedure: Fistula Superficialization;  Surgeon: Angelia Mould, MD;  Location: [facility] OR;  Service: Vascular;  Laterality: Right;   HERNIA REPAIR     IR PERC TUN PERIT CATH WO PORT S&I /IMAG  02/17/2020   IR US GUIDE VASC ACCESS RIGHT  02/18/2020   LEFT AND RIGHT HEART CATHETERIZATION WITH CORONARY  ANGIOGRAM N/A 03/03/2013   Procedure: LEFT AND RIGHT HEART CATHETERIZATION WITH CORONARY ANGIOGRAM;  Surgeon: Minus Breeding, MD;  Location: [facility] CATH LAB;  Service: Cardiovascular;  Laterality: N/A;   REPLACEMENT TOTAL KNEE Left 1998   UPPER GASTROINTESTINAL ENDOSCOPY       reports that she quit smoking about 40 years ago. She has a 5.00 pack-year smoking history. She has never used smokeless tobacco. She reports that she does not drink alcohol and does not use drugs.  Allergies  Allergen Reactions   Morphine And Related Other (See Comments)    Family request not to be given, reports pt does not wake up when given    Promethazine Hcl Other (See Comments)    REACTION: lethargy    Family History  Problem Relation Age of Onset   Liver cancer Mother        Liver Cancer  Diabetes Mother    Kidney disease Mother    Heart disease Mother        age 36's   Heart disease Father 72       MI   Heart attack Father        died of MI when pt was 5   Sickle cell anemia Father    Colon cancer Brother    Cancer Brother        Colon Cancer   Diabetes Sister    Kidney disease Sister    Heart disease Sister        age 57's   Allergies Sister    Diabetes Sister    Kidney disease Sister    Heart disease Sister        age 32's   Esophageal cancer Neg Hx    Rectal cancer Neg Hx    Stomach cancer Neg Hx    Amblyopia Neg Hx    Blindness Neg Hx    Glaucoma Neg Hx    Macular degeneration Neg Hx    Retinal detachment Neg Hx    Cataracts Neg Hx    Strabismus Neg Hx    Retinitis pigmentosa Neg Hx     Prior to Admission medications   Medication Sig Start Date End Date Taking? Authorizing Provider  acetaminophen (TYLENOL) 325 MG tablet Take 650 mg by mouth every 4 (four) hours as needed for moderate pain.    [provider]  albuterol (VENTOLIN HFA) 108 (90 Base) MCG/ACT inhaler Inhale 2 puffs into the lungs every 4 (four) hours as needed for wheezing or shortness of breath. 04/08/20    Ghimire, Henreitta Leber, MD  ascorbic acid (VITAMIN C) 500 MG tablet Take 1 tablet (500 mg total) by mouth daily. Patient not taking: Reported on 05/08/2020 04/08/20   Jonetta Osgood, MD  aspirin EC 81 MG tablet Take 1 tablet (81 mg total) by mouth daily. For 2 weeks-along with Plavix-after 2 weeks-stop aspirin. 04/08/20   Ghimire, Henreitta Leber, MD  azelastine (ASTELIN) 0.1 % nasal spray Place 1 spray into both nostrils 2 (two) times daily as needed (allergies). Use in each nostril as directed 04/08/20   Ghimire, Henreitta Leber, MD  bisacodyl (DULCOLAX) 5 MG EC tablet Take 1 tablet (5 mg total) by mouth daily as needed for moderate constipation. 04/08/20   Ghimire, Henreitta Leber, MD  calcium carbonate (OSCAL) 1500 (600 Ca) MG TABS tablet Take 1 tablet (1,500 mg total) by mouth daily. 04/08/20   Ghimire, Henreitta Leber, MD  clopidogrel (PLAVIX) 75 MG tablet Take 1 tablet (75 mg total) by mouth daily. 04/08/20   Ghimire, Henreitta Leber, MD  donepezil (ARICEPT) 5 MG tablet Take 1 tablet (5 mg total) by mouth at bedtime. 04/08/20   Ghimire, Henreitta Leber, MD  ezetimibe (ZETIA) 10 MG tablet Take 1 tablet (10 mg total) by mouth daily. 04/08/20   Ghimire, Henreitta Leber, MD  ferrous sulfate 325 (65 FE) MG tablet Take 1 tablet (325 mg total) by mouth daily. Patient not taking: Reported on 05/08/2020 04/08/20   Jonetta Osgood, MD  Fluticasone-Umeclidin-Vilant (TRELEGY ELLIPTA) 100-62.5-25 MCG/INH AEPB Take 1 puff by mouth daily. 04/08/20   Ghimire, Henreitta Leber, MD  guaiFENesin-dextromethorphan (ROBITUSSIN DM) 100-10 MG/5ML syrup Take 10 mLs by mouth every 4 (four) hours as needed for cough. 03/02/20   Sheikh, Omair Latif, DO  Ipratropium-Albuterol (COMBIVENT) 20-100 MCG/ACT AERS respimat Inhale 1 puff into the lungs every 6 (six) hours as needed for wheezing or  shortness of breath. 04/08/20 05/08/20  Ghimire, Henreitta Leber, MD  lactose free nutrition (BOOST) LIQD Take 237 mLs by mouth in the morning and at bedtime.    [provider]  lip balm  (CARMEX) ointment Apply topically as needed for lip care. 03/02/20   Sheikh, Omair Latif, DO  montelukast (SINGULAIR) 10 MG tablet Take 1 tablet (10 mg total) by mouth daily. Patient taking differently: Take 10 mg by mouth at bedtime as needed (breathing/allergies). 04/08/20   Ghimire, Henreitta Leber, MD  nitroGLYCERIN (NITROSTAT) 0.4 MG SL tablet Place 1 tablet (0.4 mg total) under the tongue every 5 (five) minutes as needed for chest pain. 04/08/20   Ghimire, Henreitta Leber, MD  OLANZapine zydis (ZYPREXA) 5 MG disintegrating tablet Take 1 tablet (5 mg total) by mouth at bedtime. 04/07/20   Ghimire, Henreitta Leber, MD  omeprazole (PRILOSEC) 40 MG capsule Take 1 capsule (40 mg total) by mouth in the morning and at bedtime. Please schedule a yearly follow up: [phone]. Thank you Patient not taking: Reported on 05/08/2020 04/08/20   Jonetta Osgood, MD  oxyCODONE-acetaminophen (PERCOCET/ROXICET) 5-325 MG tablet Take 1 tablet by mouth every 6 (six) hours as needed for severe pain. 06/20/20   Isla Pence, MD  OXYGEN Inhale 2 L into the lungs as needed. CPAP with oxygen at bedtime    [provider]  polyethylene glycol (MIRALAX) 17 g packet Take 17 g by mouth daily as needed (constipation.). Patient not taking: Reported on 05/08/2020 04/08/20   Jonetta Osgood, MD  Propylene Glycol (SYSTANE BALANCE) 0.6 % SOLN Place 1 drop into both eyes daily as needed (dry eyes).    [provider]  Vitamin D3 (VITAMIN D) 25 MCG tablet Take 1 tablet (1,000 Units total) by mouth daily. 04/08/20   Jonetta Osgood, MD    Physical Exam: Vitals:   11/09/20 1931 11/09/20 2045 11/09/20 2200 11/09/20 2215  BP: (!) 82/54 93/63 94/77    Pulse: (!) 115 (!) 116    Resp: (!) 21 17 13 18   Temp: 97.8 F (36.6 C)     TempSrc: Oral     SpO2: 90% 91%      Physical Exam Constitutional:      General: She is not in acute distress. HENT:     Head: Normocephalic and atraumatic.  Eyes:     Extraocular Movements:  Extraocular movements intact.     Conjunctiva/sclera: Conjunctivae normal.  Cardiovascular:     Rate and Rhythm: Normal rate and regular rhythm.     Pulses: Normal pulses.  Pulmonary:     Effort: Pulmonary effort is normal. No respiratory distress.     Breath sounds: No wheezing or rales.  Abdominal:     General: Bowel sounds are normal. There is no distension.     Palpations: Abdomen is soft.     Tenderness: There is no abdominal tenderness.  Musculoskeletal:        General: No swelling or tenderness.     Cervical back: Normal range of motion and neck supple.  Skin:    General: Skin is warm and dry.  Neurological:     Mental Status: She is alert.     Comments: Awake and alert Agitated, pulling on IV lines, pushing me away Not following commands     Labs on Admission: I have personally reviewed following labs and imaging studies  CBC: Recent Labs  Lab 11/09/20 1728  WBC 13.0*  NEUTROABS 11.2*  HGB 9.9*  HCT 31.1*  MCV 74.0*  PLT 846   Basic Metabolic Panel: Recent Labs  Lab 11/09/20 1728  NA 138  K 3.9  CL 101  CO2 19*  GLUCOSE 172*  BUN 8  CREATININE 3.03*  CALCIUM 7.6*   GFR: CrCl cannot be calculated (Unknown ideal weight.). Liver Function Tests: Recent Labs  Lab 11/09/20 1728  AST 26  ALT 12  ALKPHOS 94  BILITOT 1.0  PROT 5.6*  ALBUMIN 2.3*   No results for input(s): LIPASE, AMYLASE in the last 168 hours. No results for input(s): AMMONIA in the last 168 hours. Coagulation Profile: No results for input(s): INR, PROTIME in the last 168 hours. Cardiac Enzymes: No results for input(s): CKTOTAL, CKMB, CKMBINDEX, TROPONINI in the last 168 hours. BNP (last 3 results) No results for input(s): PROBNP in the last 8760 hours. HbA1C: No results for input(s): HGBA1C in the last 72 hours. CBG: Recent Labs  Lab 11/09/20 1744 11/09/20 1847 11/09/20 2346  GLUCAP 14* 214* 138*   Lipid Profile: No results for input(s): CHOL, HDL, LDLCALC, TRIG,  CHOLHDL, LDLDIRECT in the last 72 hours. Thyroid Function Tests: No results for input(s): TSH, T4TOTAL, FREET4, T3FREE, THYROIDAB in the last 72 hours. Anemia Panel: No results for input(s): VITAMINB12, FOLATE, FERRITIN, TIBC, IRON, RETICCTPCT in the last 72 hours. Urine analysis:    Component Value Date/Time   COLORURINE AMBER (A) 03/09/2020 0645   APPEARANCEUR CLOUDY (A) 03/09/2020 0645   LABSPEC 1.020 03/09/2020 0645   PHURINE 5.0 03/09/2020 0645   GLUCOSEU NEGATIVE 03/09/2020 0645   GLUCOSEU >=1000 01/02/2009 1101   HGBUR NEGATIVE 03/09/2020 0645   HGBUR negative 03/25/2008 1022   BILIRUBINUR NEGATIVE 03/09/2020 0645   KETONESUR NEGATIVE 03/09/2020 0645   PROTEINUR 100 (A) 03/09/2020 0645   UROBILINOGEN 0.2 09/14/2013 1632   NITRITE NEGATIVE 03/09/2020 0645   LEUKOCYTESUR TRACE (A) 03/09/2020 0645    Radiological Exams on Admission: CT Head Wo Contrast  Result Date: 11/09/2020 CLINICAL DATA:  Mental status change, hypertensive emergency EXAM: CT HEAD WITHOUT CONTRAST TECHNIQUE: Contiguous axial images were obtained from the base of the skull through the vertex without intravenous contrast. COMPARISON:  05/11/2020 FINDINGS: Brain: There is atrophy and chronic small vessel disease changes. No acute intracranial abnormality. Specifically, no hemorrhage, hydrocephalus, mass lesion, acute infarction, or significant intracranial injury. Vascular: No hyperdense vessel or unexpected calcification. Skull: No acute calvarial abnormality. Sinuses/Orbits: No acute findings Other: None IMPRESSION: Atrophy, chronic microvascular disease. No acute intracranial abnormality. Electronically Signed   By: Rolm Baptise M.D.   On: 11/09/2020 20:26   DG CHEST PORT 1 VIEW  Result Date: 11/09/2020 CLINICAL DATA:  Syncope, seizure EXAM: PORTABLE CHEST 1 VIEW COMPARISON:  09/23/2020 FINDINGS: Right dialysis catheter remains in stable position with the tip in the lower right atrium. Heart is normal size.  Lungs clear. No effusions or pneumothorax. No acute bony abnormality. IMPRESSION: No active disease. Electronically Signed   By: Rolm Baptise M.D.   On: 11/09/2020 18:10    EKG: Independently reviewed.  Sinus tachycardia, borderline QT prolongation.  No acute ischemic changes.  Assessment/Plan Principal Problem:   Syncope Active Problems:   Acute on chronic respiratory failure (HCC)   Severe sepsis (HCC)   Hypotension   Hypoglycemia   Syncope Hypotension Acute on chronic hypoxic respiratory failure Hypotensive with blood pressure 53/27 initially, now improved with systolic in the 962X after 1.5 L fluid boluses.  Reportedly no fluid taken off at dialysis today.  Hypotension could be due to infection/sepsis but PE  is also on the differential given hypoxia/episode of apnea at dialysis.  Reportedly not using any oxygen at home.  Currently requiring 5 L supplemental oxygen to maintain sats above 90%.  Chest x-ray showing no active disease.  Also concern for possible seizure-like activity at dialysis.  Head CT negative for acute intracranial abnormality. -CTA chest to rule out PE (per nephrology note 02/16/2020 she is an anuric and patient's boyfriend at bedside confirmed).  Echocardiogram.  EEG ordered, seizure precautions.  Stat ABG. Admit to progressive care unit and monitor very closely.  Addendum: Patient currently only has one peripheral IV access and is constantly moving her arm/agitated.  As such IV contrast administration for CTA cannot be done at this time.  As such, will obtain CT chest without contrast and check D-dimer level.  Hypotension and tachycardia have resolved.  Currently satting 100% on 3 L supplemental oxygen, not tachypneic.   Possible severe sepsis Hypotensive and tachycardic.  However, no fever.  Leukocytosis only mild (WBC 13.0).  Lactate significantly elevated at 8.4 but reportedly had seizure-like activity at dialysis.  Lactic acidosis improving after IV fluid  resuscitation.  COVID and influenza PCR negative.  Chest x-ray not suggestive of pneumonia.  She is anuric, cannot obtain urine sample.  Difficult to assess change in mental status as she has advanced dementia.  No meningeal signs on exam.  ?Bacteremia as she is a dialysis patient and has temporary HD cath access. ?Abdominal source of infection as patient's boyfriend reported recent diarrhea, not eating. -Broad-spectrum antibiotics at this time: Continue vancomycin and cefepime.  Also start Flagyl.  Check procalcitonin level.  Blood culture x2 pending.  Trend lactate and WBC count.  CT abdomen/pelvis.  Recurrent hypoglycemia Type 2 diabetes Likely multifactorial from poor oral intake/sepsis.  She is not on insulin or any oral hypoglycemic agents.  Continued to have recurrent hypoglycemia with CBG as low as 14 despite receiving glucagon.  After she was given D50 in the ED, CBG initially improved but now trending down again. -Start D10 at 50 cc/h.  Hypoglycemia protocol.  CBG checks every 2 hours.  Check A1c.  Mild troponin elevation Likely due to demand ischemia.  Not able to get any history from the patient.  ACS less likely as troponin mildly elevated but stable.  EKG without acute ischemic changes. -Cardiac monitoring  ESRD on HD TTS Potassium 3.9, bicarb 19.  No signs of volume overload. -Consult nephrology in a.m.  Chronic diastolic CHF Stable.  No signs of volume overload.  COPD Stable, not wheezing. -DuoNeb as needed  OSA -Continue CPAP at night  DVT prophylaxis: Subcutaneous heparin Code Status: Full code per review of records from prior hospitalization.  In addition, patient's boyfriend confirmed that she is full code. Family Communication: Boyfriend at bedside, no other family member available at this time. Disposition Plan: Status is: Inpatient  Remains inpatient appropriate because: Severe sepsis, syncope, hypotension, acute on chronic hypoxic respiratory failure  Level of  care: Level of care: Progressive  The medical decision making on this patient was of high complexity and the patient is at high risk for clinical deterioration, therefore this is a level 3 visit.  Shela Leff MD [facility]  If 7PM-7AM, please contact night-coverage [email or web address]  11/10/2020, 1:01 AM

## 2020-11-10 ENCOUNTER — Inpatient Hospital Stay (HOSPITAL_COMMUNITY): Payer: Medicare HMO

## 2020-11-10 DIAGNOSIS — I959 Hypotension, unspecified: Secondary | ICD-10-CM

## 2020-11-10 DIAGNOSIS — R7989 Other specified abnormal findings of blood chemistry: Secondary | ICD-10-CM | POA: Diagnosis not present

## 2020-11-10 DIAGNOSIS — E162 Hypoglycemia, unspecified: Secondary | ICD-10-CM

## 2020-11-10 DIAGNOSIS — J9621 Acute and chronic respiratory failure with hypoxia: Secondary | ICD-10-CM

## 2020-11-10 DIAGNOSIS — R55 Syncope and collapse: Secondary | ICD-10-CM

## 2020-11-10 HISTORY — PX: IR FLUORO GUIDE CV LINE LEFT: IMG2282

## 2020-11-10 HISTORY — PX: IR US GUIDE VASC ACCESS LEFT: IMG2389

## 2020-11-10 LAB — I-STAT ARTERIAL BLOOD GAS, ED
Acid-Base Excess: 2 mmol/L (ref 0.0–2.0)
Bicarbonate: 23.7 mmol/L (ref 20.0–28.0)
Calcium, Ion: 1.04 mmol/L — ABNORMAL LOW (ref 1.15–1.40)
HCT: 34 % — ABNORMAL LOW (ref 36.0–46.0)
Hemoglobin: 11.6 g/dL — ABNORMAL LOW (ref 12.0–15.0)
O2 Saturation: 99 %
Potassium: 3.8 mmol/L (ref 3.5–5.1)
Sodium: 140 mmol/L (ref 135–145)
TCO2: 25 mmol/L (ref 22–32)
pCO2 arterial: 29 mmHg — ABNORMAL LOW (ref 32.0–48.0)
pH, Arterial: 7.521 — ABNORMAL HIGH (ref 7.350–7.450)
pO2, Arterial: 137 mmHg — ABNORMAL HIGH (ref 83.0–108.0)

## 2020-11-10 LAB — HEMOGLOBIN A1C
Hgb A1c MFr Bld: 5 % (ref 4.8–5.6)
Mean Plasma Glucose: 96.8 mg/dL

## 2020-11-10 LAB — BASIC METABOLIC PANEL
Anion gap: 10 (ref 5–15)
BUN: 14 mg/dL (ref 8–23)
CO2: 25 mmol/L (ref 22–32)
Calcium: 8.1 mg/dL — ABNORMAL LOW (ref 8.9–10.3)
Chloride: 104 mmol/L (ref 98–111)
Creatinine, Ser: 3.84 mg/dL — ABNORMAL HIGH (ref 0.44–1.00)
GFR, Estimated: 12 mL/min — ABNORMAL LOW (ref >60)
Glucose, Bld: 96 mg/dL (ref 70–99)
Potassium: 3.8 mmol/L (ref 3.5–5.1)
Sodium: 139 mmol/L (ref 135–145)

## 2020-11-10 LAB — CBG MONITORING, ED
Glucose-Capillary: 95 mg/dL (ref 70–99)
Glucose-Capillary: 97 mg/dL (ref 70–99)
Glucose-Capillary: 88 mg/dL (ref 70–99)
Glucose-Capillary: 85 mg/dL (ref 70–99)

## 2020-11-10 LAB — GLUCOSE, CAPILLARY
Glucose-Capillary: 93 mg/dL (ref 70–99)
Glucose-Capillary: 94 mg/dL (ref 70–99)

## 2020-11-10 LAB — PATHOLOGIST SMEAR REVIEW

## 2020-11-10 LAB — APTT
aPTT: >200 seconds (ref 24–36)
aPTT: >200 seconds (ref 24–36)

## 2020-11-10 LAB — LACTIC ACID, PLASMA: Lactic Acid, Venous: 1.4 mmol/L (ref 0.5–1.9)

## 2020-11-10 LAB — VITAMIN B12: Vitamin B-12: 847 pg/mL (ref 180–914)

## 2020-11-10 LAB — AMMONIA: Ammonia: 11 umol/L (ref 9–35)

## 2020-11-10 LAB — PROTIME-INR
INR: 1.7 — ABNORMAL HIGH (ref 0.8–1.2)
Prothrombin Time: 19.7 seconds — ABNORMAL HIGH (ref 11.4–15.2)

## 2020-11-10 LAB — D-DIMER, QUANTITATIVE: D-Dimer, Quant: 5.63 ug/mL-FEU — ABNORMAL HIGH (ref 0.00–0.50)

## 2020-11-10 LAB — PROCALCITONIN: Procalcitonin: 8.79 ng/mL

## 2020-11-10 LAB — HEPARIN LEVEL (UNFRACTIONATED)
Heparin Unfractionated: >1.1 [IU]/mL — ABNORMAL HIGH (ref 0.30–0.70)
Heparin Unfractionated: >1.1 IU/mL — ABNORMAL HIGH (ref 0.30–0.70)

## 2020-11-10 LAB — HEPATITIS B SURFACE ANTIGEN: Hepatitis B Surface Ag: NONREACTIVE

## 2020-11-10 MED ORDER — ACETAMINOPHEN 325 MG PO TABS: 650.0000 mg | ORAL_TABLET | Freq: Four times a day (QID) | ORAL | Status: DC | PRN

## 2020-11-10 MED ORDER — IPRATROPIUM-ALBUTEROL 0.5-2.5 (3) MG/3ML IN SOLN: 3.0000 mL | Freq: Four times a day (QID) | RESPIRATORY_TRACT | Status: DC | PRN

## 2020-11-10 MED ORDER — THIAMINE HCL 100 MG/ML IJ SOLN
100.0000 mg | Freq: Every day | INTRAMUSCULAR | Status: DC
  Administered 2020-11-10 – 2020-11-23 (×12): 100 mg via INTRAVENOUS
  Filled 2020-11-10 (×12): qty 2

## 2020-11-10 MED ORDER — IOHEXOL 350 MG/ML SOLN
75.0000 mL | Freq: Once | INTRAVENOUS | Status: AC | PRN
  Administered 2020-11-10: 75 mL via INTRAVENOUS

## 2020-11-10 MED ORDER — CHLORHEXIDINE GLUCONATE CLOTH 2 % EX PADS
6.0000 | MEDICATED_PAD | Freq: Every day | CUTANEOUS | Status: DC
  Administered 2020-11-11 – 2020-11-13 (×3): 6 via TOPICAL

## 2020-11-10 MED ORDER — METRONIDAZOLE 500 MG/100ML IV SOLN
500.0000 mg | Freq: Two times a day (BID) | INTRAVENOUS | Status: DC
  Administered 2020-11-10 – 2020-11-11 (×4): 500 mg via INTRAVENOUS
  Filled 2020-11-10 (×5): qty 100

## 2020-11-10 MED ORDER — HEPARIN SODIUM (PORCINE) 1000 UNIT/ML IJ SOLN
INTRAMUSCULAR | Status: AC
  Filled 2020-11-10: qty 1

## 2020-11-10 MED ORDER — ACETAMINOPHEN 650 MG RE SUPP: 650.0000 mg | Freq: Four times a day (QID) | RECTAL | Status: DC | PRN

## 2020-11-10 MED ORDER — HEPARIN (PORCINE) 25000 UT/250ML-% IV SOLN
1250.0000 [IU]/h | INTRAVENOUS | Status: DC
  Administered 2020-11-10: 1250 [IU]/h via INTRAVENOUS
  Filled 2020-11-10: qty 250

## 2020-11-10 MED ORDER — HEPARIN BOLUS VIA INFUSION
4500.0000 [IU] | Freq: Once | INTRAVENOUS | Status: AC
  Administered 2020-11-10: 4500 [IU] via INTRAVENOUS
  Filled 2020-11-10: qty 4500

## 2020-11-10 MED ORDER — HALOPERIDOL LACTATE 5 MG/ML IJ SOLN
1.0000 mg | Freq: Once | INTRAMUSCULAR | Status: DC | PRN
  Filled 2020-11-10: qty 1

## 2020-11-10 MED ORDER — HEPARIN (PORCINE) 25000 UT/250ML-% IV SOLN
950.0000 [IU]/h | INTRAVENOUS | Status: AC
  Administered 2020-11-10 – 2020-11-11 (×2): 950 [IU]/h via INTRAVENOUS
  Filled 2020-11-10: qty 250

## 2020-11-10 MED ORDER — DEXTROSE 10 % IV SOLN: INTRAVENOUS | Status: DC

## 2020-11-10 MED ORDER — HEPARIN SODIUM (PORCINE) 5000 UNIT/ML IJ SOLN: 5000.0000 [IU] | Freq: Three times a day (TID) | INTRAMUSCULAR | Status: DC

## 2020-11-10 MED ORDER — LIDOCAINE-EPINEPHRINE 1 %-1:100000 IJ SOLN
INTRAMUSCULAR | Status: AC
  Filled 2020-11-10: qty 1

## 2020-11-10 NOTE — Progress Notes (Signed)
Per RN patient still not cooperative enough for EEG.

## 2020-11-10 NOTE — Consult Note (Signed)
[facility] Renal Consultation Note    Indication for Consultation:  Management of ESRD/hemodialysis, anemia, hypertension/volume, and secondary hyperparathyroidism.  HPI: Joann Gutierrez is a 75 y.o. female with PMH including ESRD on dialysis TTS, CHF, COPD, diabetes mellitus, dementia, and HTN, who presented to the ED on 11/09/20 after a syncopal episode at dialysis. No UF was removed with dialysis however after treatment she had witnesses syncope with "seizure like activity." She was given chest compressions then work up. She was hypoglycemia with CBG 53. BP was 53/27, improved with 1L NSS but then dropped again to 82/54. BP stabilized after an additional 536ml IVF. Labs notable for WBC 13.0, hemoglobin 9.9 (stable), platelet count 183k.  Sodium 138, potassium 3.9, chloride 101, bicarb 19, BUN 8, creatinine 3.0, glucose 172. Glucose then dropped again to 14, given 1 amp D50. She has been intermittently agitated. CTA today showed acute pulmonary embolism in the left upper lobe segmental pulmonary artery.  At time of exam, patient is sleeping but awakens to voice. Her boyfriend reports she is very tired after last night's activities. She is unable to answer ROS questions at present, info obtained from chart.     Past Medical History:  Diagnosis Date   Adenomatous colon polyp    Allergy    Anemia    Asthma        CAD (coronary artery disease)    Mild very minimal coronary disease with 20% obtuse marginal stenosis   Carpal tunnel syndrome on left    Cataract    CHF (congestive heart failure) (HCC)    Chronic kidney disease (CKD), stage III (moderate) (HCC)    now stage 4   COPD (chronic obstructive pulmonary disease) (HCC)    Dementia with behavioral disturbance ([place]) 03/09/2020   Depression    Diabetes mellitus 1997   Type II    Diverticulosis    Dyspnea    Elevated diaphragm November 2011   Right side   Esophageal dysmotility    Esophageal stricture    ESRD on  hemodialysis ([place]) 03/09/2020   Gastritis    Gastroparesis 08/21/2007   GERD (gastroesophageal reflux disease)    Hearing loss of both ears    Hernia, hiatal    Hyperlipidemia    Hypertension    Morbid obesity ([place])    OSTEOARTHRITIS 08/09/2006   Osteoporosis    Oxygen deficiency    prn    PERIPHERAL NEUROPATHY, FEET 09/23/2007   RENAL INSUFFICIENCY 02/16/2009   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   Seizures ([place])    pt thinks it has been several monthes since she had a seisure   Shingles    Sickle cell trait ([place])    Sleep apnea    uses cpap   Stroke [facility])    Past Surgical History:  Procedure Laterality Date   ABDOMINAL HYSTERECTOMY     ARTERY BIOPSY  01/07/2011   Procedure: MINOR BIOPSY TEMPORAL ARTERY;  Surgeon: Haywood Lasso, MD;  Location: [place];  Service: General;  Laterality: Left;  left temporal artery biopsy   AV FISTULA PLACEMENT Right 11/16/2018   Procedure: CREATION RIGHT BRACHIOCEPHALIC FISTULA  ARTERIOVENOUS FISTULA;  Surgeon: Angelia Mould, MD;  Location: [facility] OR;  Service: Vascular;  Laterality: Right;   bil foot surgery     BREAST LUMPECTOMY     benign   BREAST LUMPECTOMY     both breast lumps removed    CATARACT EXTRACTION Left 06/2016   Dr. Read Drivers  COLONOSCOPY     COLONOSCOPY WITH PROPOFOL N/A 07/05/2015   Procedure: COLONOSCOPY WITH PROPOFOL;  Surgeon: Manus Gunning, MD;  Location: [facility];  Service: Gastroenterology;  Laterality: N/A;   COLONOSCOPY WITH PROPOFOL N/A 08/04/2018   Procedure: COLONOSCOPY WITH PROPOFOL;  Surgeon: Yetta Flock, MD;  Location: [facility];  Service: Gastroenterology;  Laterality: N/A;   ERD  08/08/2000   ESOPHAGEAL MANOMETRY N/A 03/13/2015   Procedure: ESOPHAGEAL MANOMETRY (EM);  Surgeon: Manus Gunning, MD;  Location: [facility];  Service: Gastroenterology;  Laterality: N/A;   ESOPHAGOGASTRODUODENOSCOPY  06/25/2006   ESOPHAGOGASTRODUODENOSCOPY (EGD) WITH  PROPOFOL N/A 07/05/2015   Procedure: ESOPHAGOGASTRODUODENOSCOPY (EGD) WITH PROPOFOL;  Surgeon: Manus Gunning, MD;  Location: [facility];  Service: Gastroenterology;  Laterality: N/A;   FISTULA SUPERFICIALIZATION Right 11/16/2018   Procedure: Fistula Superficialization;  Surgeon: Angelia Mould, MD;  Location: [facility] OR;  Service: Vascular;  Laterality: Right;   HERNIA REPAIR     IR PERC TUN PERIT CATH WO PORT S&I /IMAG  02/17/2020   IR US GUIDE VASC ACCESS RIGHT  02/18/2020   LEFT AND RIGHT HEART CATHETERIZATION WITH CORONARY ANGIOGRAM N/A 03/03/2013   Procedure: LEFT AND RIGHT HEART CATHETERIZATION WITH CORONARY ANGIOGRAM;  Surgeon: Minus Breeding, MD;  Location: [facility] CATH LAB;  Service: Cardiovascular;  Laterality: N/A;   REPLACEMENT TOTAL KNEE Left 1998   UPPER GASTROINTESTINAL ENDOSCOPY     Family History  Problem Relation Age of Onset   Liver cancer Mother        Liver Cancer   Diabetes Mother    Kidney disease Mother    Heart disease Mother        age 33's   Heart disease Father 64       MI   Heart attack Father        died of MI when pt was 5   Sickle cell anemia Father    Colon cancer Brother    Cancer Brother        Colon Cancer   Diabetes Sister    Kidney disease Sister    Heart disease Sister        age 3's   Allergies Sister    Diabetes Sister    Kidney disease Sister    Heart disease Sister        age 57's   Esophageal cancer Neg Hx    Rectal cancer Neg Hx    Stomach cancer Neg Hx    Amblyopia Neg Hx    Blindness Neg Hx    Glaucoma Neg Hx    Macular degeneration Neg Hx    Retinal detachment Neg Hx    Cataracts Neg Hx    Strabismus Neg Hx    Retinitis pigmentosa Neg Hx    Social History:  reports that she quit smoking about 40 years ago. She has a 5.00 pack-year smoking history. She has never used smokeless tobacco. She reports that she does not drink alcohol and does not use drugs.  ROS: As per HPI otherwise negative.  Physical  Exam: Vitals:   11/10/20 0645 11/10/20 0906 11/10/20 1155 11/10/20 1200  BP: 125/78 137/75 (!) 123/43   Pulse: 100 (!) 102 90   Resp: 14 20 13    Temp:  (!) 97.3 F (36.3 C)    TempSrc:  Axillary    SpO2: 99% 98% 98%   Weight:    99 kg  Height:    5\' 4"  (1.626 m)     General: Sleeping,  opens eyes to voice, NAD Head: Normocephalic, atraumatic, sclera non-icteric, mucus membranes are moist. Lungs: Clear bilaterally to auscultation without wheezes, rales, or rhonchi. Breathing is unlabored on O2 via [place] Heart: RRR with normal S1, S2. No murmurs, rubs, or gallops appreciated. Abdomen: Soft, non-tender, non-distended with normoactive bowel sounds. No rebound/guarding. No obvious abdominal masses. Musculoskeletal:  Strength and tone appear normal for age. Lower extremities: trace pitting edema bilateral lower extremities Dialysis Access: [facility]  Allergies  Allergen Reactions   Morphine And Related Other (See Comments)    Family request not to be given, reports pt does not wake up when given    Promethazine Hcl Other (See Comments)    REACTION: lethargy   Prior to Admission medications   Medication Sig Start Date End Date Taking? Authorizing Provider  albuterol (VENTOLIN HFA) 108 (90 Base) MCG/ACT inhaler Inhale 2 puffs into the lungs every 4 (four) hours as needed for wheezing or shortness of breath. Patient not taking: No sig reported 04/08/20   Jonetta Osgood, MD  ascorbic acid (VITAMIN C) 500 MG tablet Take 1 tablet (500 mg total) by mouth daily. Patient not taking: No sig reported 04/08/20   Jonetta Osgood, MD  aspirin EC 81 MG tablet Take 1 tablet (81 mg total) by mouth daily. For 2 weeks-along with Plavix-after 2 weeks-stop aspirin. Patient not taking: No sig reported 04/08/20   Jonetta Osgood, MD  azelastine (ASTELIN) 0.1 % nasal spray Place 1 spray into both nostrils 2 (two) times daily as needed (allergies). Use in each nostril as directed Patient not taking: No sig  reported 04/08/20   Ghimire, Henreitta Leber, MD  bisacodyl (DULCOLAX) 5 MG EC tablet Take 1 tablet (5 mg total) by mouth daily as needed for moderate constipation. Patient not taking: No sig reported 04/08/20   Jonetta Osgood, MD  calcium carbonate (OSCAL) 1500 (600 Ca) MG TABS tablet Take 1 tablet (1,500 mg total) by mouth daily. Patient not taking: No sig reported 04/08/20   Jonetta Osgood, MD  clopidogrel (PLAVIX) 75 MG tablet Take 1 tablet (75 mg total) by mouth daily. Patient not taking: No sig reported 04/08/20   Jonetta Osgood, MD  donepezil (ARICEPT) 5 MG tablet Take 1 tablet (5 mg total) by mouth at bedtime. Patient not taking: No sig reported 04/08/20   Jonetta Osgood, MD  ezetimibe (ZETIA) 10 MG tablet Take 1 tablet (10 mg total) by mouth daily. Patient not taking: No sig reported 04/08/20   Jonetta Osgood, MD  ferrous sulfate 325 (65 FE) MG tablet Take 1 tablet (325 mg total) by mouth daily. Patient not taking: No sig reported 04/08/20   Jonetta Osgood, MD  Fluticasone-Umeclidin-Vilant (TRELEGY ELLIPTA) 100-62.5-25 MCG/INH AEPB Take 1 puff by mouth daily. Patient not taking: No sig reported 04/08/20   Ghimire, Henreitta Leber, MD  guaiFENesin-dextromethorphan (ROBITUSSIN DM) 100-10 MG/5ML syrup Take 10 mLs by mouth every 4 (four) hours as needed for cough. Patient not taking: No sig reported 03/02/20   Raiford Noble Latif, DO  Ipratropium-Albuterol (COMBIVENT) 20-100 MCG/ACT AERS respimat Inhale 1 puff into the lungs every 6 (six) hours as needed for wheezing or shortness of breath. Patient not taking: Reported on 11/10/2020 04/08/20 05/08/20  Jonetta Osgood, MD  lip balm (CARMEX) ointment Apply topically as needed for lip care. Patient not taking: No sig reported 03/02/20   Raiford Noble Latif, DO  montelukast (SINGULAIR) 10 MG tablet Take 1 tablet (10 mg total) by mouth  daily. Patient not taking: No sig reported 04/08/20   Jonetta Osgood, MD  nitroGLYCERIN (NITROSTAT) 0.4  MG SL tablet Place 1 tablet (0.4 mg total) under the tongue every 5 (five) minutes as needed for chest pain. Patient not taking: No sig reported 04/08/20   Ghimire, Henreitta Leber, MD  OLANZapine zydis (ZYPREXA) 5 MG disintegrating tablet Take 1 tablet (5 mg total) by mouth at bedtime. Patient not taking: No sig reported 04/07/20   Jonetta Osgood, MD  oxyCODONE-acetaminophen (PERCOCET/ROXICET) 5-325 MG tablet Take 1 tablet by mouth every 6 (six) hours as needed for severe pain. Patient not taking: No sig reported 06/20/20   Isla Pence, MD  OXYGEN Inhale 2 L into the lungs as needed. CPAP with oxygen at bedtime    [provider]  polyethylene glycol (MIRALAX) 17 g packet Take 17 g by mouth daily as needed (constipation.). Patient not taking: No sig reported 04/08/20   Jonetta Osgood, MD  Vitamin D3 (VITAMIN D) 25 MCG tablet Take 1 tablet (1,000 Units total) by mouth daily. Patient not taking: Reported on 11/10/2020 04/08/20   Jonetta Osgood, MD   Current Facility-Administered Medications  Medication Dose Route Frequency Provider Last Rate Last Admin   acetaminophen (TYLENOL) tablet 650 mg  650 mg Oral Q6H PRN Shela Leff, MD       Or   acetaminophen (TYLENOL) suppository 650 mg  650 mg Rectal Q6H PRN Shela Leff, MD       ceFEPIme (MAXIPIME) 1 g in sodium chloride 0.9 % 100 mL IVPB  1 g Intravenous Q24H Shela Leff, MD   Stopped at 11/09/20 2231   dextrose 10 % infusion   Intravenous Continuous Shela Leff, MD   Paused at 11/10/20 0829   dextrose 50 % solution 12.5 g  12.5 g Intravenous PRN Shela Leff, MD       haloperidol lactate (HALDOL) injection 1 mg  1 mg Intravenous Once PRN Shela Leff, MD       heparin ADULT infusion 100 units/mL (25000 units/282mL)  1,250 Units/hr Intravenous Continuous Regalado, Belkys A, MD       heparin bolus via infusion 4,500 Units  4,500 Units Intravenous Once Regalado, Belkys A, MD        ipratropium-albuterol (DUONEB) 0.5-2.5 (3) MG/3ML nebulizer solution 3 mL  3 mL Nebulization Q6H PRN Shela Leff, MD       metroNIDAZOLE (FLAGYL) IVPB 500 mg  500 mg Intravenous BID Shela Leff, MD   Stopped at 11/10/20 1155   thiamine (B-1) injection 100 mg  100 mg Intravenous Daily Regalado, Belkys A, MD   100 mg at 11/10/20 1023   vancomycin variable dose per unstable renal function (pharmacist dosing)   Does not apply See admin instructions Shela Leff, MD       Current Outpatient Medications  Medication Sig Dispense Refill   albuterol (VENTOLIN HFA) 108 (90 Base) MCG/ACT inhaler Inhale 2 puffs into the lungs every 4 (four) hours as needed for wheezing or shortness of breath. (Patient not taking: No sig reported) 18 g 6   ascorbic acid (VITAMIN C) 500 MG tablet Take 1 tablet (500 mg total) by mouth daily. (Patient not taking: No sig reported) 30 tablet 0   aspirin EC 81 MG tablet Take 1 tablet (81 mg total) by mouth daily. For 2 weeks-along with Plavix-after 2 weeks-stop aspirin. (Patient not taking: No sig reported) 30 tablet 11   azelastine (ASTELIN) 0.1 % nasal spray Place 1 spray into both  nostrils 2 (two) times daily as needed (allergies). Use in each nostril as directed (Patient not taking: No sig reported) 30 mL 0   bisacodyl (DULCOLAX) 5 MG EC tablet Take 1 tablet (5 mg total) by mouth daily as needed for moderate constipation. (Patient not taking: No sig reported) 30 tablet 0   calcium carbonate (OSCAL) 1500 (600 Ca) MG TABS tablet Take 1 tablet (1,500 mg total) by mouth daily. (Patient not taking: No sig reported) 30 tablet 0   clopidogrel (PLAVIX) 75 MG tablet Take 1 tablet (75 mg total) by mouth daily. (Patient not taking: No sig reported) 30 tablet 0   donepezil (ARICEPT) 5 MG tablet Take 1 tablet (5 mg total) by mouth at bedtime. (Patient not taking: No sig reported) 30 tablet 0   ezetimibe (ZETIA) 10 MG tablet Take 1 tablet (10 mg total) by mouth daily. (Patient  not taking: No sig reported) 30 tablet 0   ferrous sulfate 325 (65 FE) MG tablet Take 1 tablet (325 mg total) by mouth daily. (Patient not taking: No sig reported) 30 tablet 0   Fluticasone-Umeclidin-Vilant (TRELEGY ELLIPTA) 100-62.5-25 MCG/INH AEPB Take 1 puff by mouth daily. (Patient not taking: No sig reported) 60 each 0   guaiFENesin-dextromethorphan (ROBITUSSIN DM) 100-10 MG/5ML syrup Take 10 mLs by mouth every 4 (four) hours as needed for cough. (Patient not taking: No sig reported) 118 mL 0   Ipratropium-Albuterol (COMBIVENT) 20-100 MCG/ACT AERS respimat Inhale 1 puff into the lungs every 6 (six) hours as needed for wheezing or shortness of breath. (Patient not taking: Reported on 11/10/2020) 4 g 0   lip balm (CARMEX) ointment Apply topically as needed for lip care. (Patient not taking: No sig reported) 7 g 0   montelukast (SINGULAIR) 10 MG tablet Take 1 tablet (10 mg total) by mouth daily. (Patient not taking: No sig reported) 30 tablet 3   nitroGLYCERIN (NITROSTAT) 0.4 MG SL tablet Place 1 tablet (0.4 mg total) under the tongue every 5 (five) minutes as needed for chest pain. (Patient not taking: No sig reported) 30 tablet 0   OLANZapine zydis (ZYPREXA) 5 MG disintegrating tablet Take 1 tablet (5 mg total) by mouth at bedtime. (Patient not taking: No sig reported)     oxyCODONE-acetaminophen (PERCOCET/ROXICET) 5-325 MG tablet Take 1 tablet by mouth every 6 (six) hours as needed for severe pain. (Patient not taking: No sig reported) 15 tablet 0   OXYGEN Inhale 2 L into the lungs as needed. CPAP with oxygen at bedtime     polyethylene glycol (MIRALAX) 17 g packet Take 17 g by mouth daily as needed (constipation.). (Patient not taking: No sig reported)     Vitamin D3 (VITAMIN D) 25 MCG tablet Take 1 tablet (1,000 Units total) by mouth daily. (Patient not taking: Reported on 11/10/2020) 30 tablet 0   Labs: Basic Metabolic Panel: Recent Labs  Lab 11/09/20 1728 11/10/20 0227 11/10/20 0916  NA  138 140 139  K 3.9 3.8 3.8  CL 101  --  104  CO2 19*  --  25  GLUCOSE 172*  --  96  BUN 8  --  14  CREATININE 3.03*  --  3.84*  CALCIUM 7.6*  --  8.1*   Liver Function Tests: Recent Labs  Lab 11/09/20 1728  AST 26  ALT 12  ALKPHOS 94  BILITOT 1.0  PROT 5.6*  ALBUMIN 2.3*   No results for input(s): LIPASE, AMYLASE in the last 168 hours. Recent Labs  Lab 11/10/20  9798  AMMONIA 11   CBC: Recent Labs  Lab 11/09/20 1728 11/10/20 0227 11/10/20 0249  WBC 13.0*  --  12.5*  NEUTROABS 11.2*  --   --   HGB 9.9* 11.6* 9.0*  HCT 31.1* 34.0* 28.4*  MCV 74.0*  --  74.5*  PLT 183  --  146*   Cardiac Enzymes: No results for input(s): CKTOTAL, CKMB, CKMBINDEX, TROPONINI in the last 168 hours. CBG: Recent Labs  Lab 11/09/20 1847 11/09/20 2346 11/10/20 0409 11/10/20 0818 11/10/20 1020  GLUCAP 214* 138* 95 97 88   Iron Studies: No results for input(s): IRON, TIBC, TRANSFERRIN, FERRITIN in the last 72 hours. Studies/Results: CT ABDOMEN PELVIS WO CONTRAST  Result Date: 11/10/2020 CLINICAL DATA:  Sepsis EXAM: CT CHEST, ABDOMEN AND PELVIS WITHOUT CONTRAST TECHNIQUE: Multidetector CT imaging of the chest, abdomen and pelvis was performed following the standard protocol without IV contrast. COMPARISON:  Chest radiograph dated 11/09/2020. CT pelvis dated 07/04/2020. CT abdomen/pelvis dated 09/05/2017. FINDINGS: CT CHEST FINDINGS Cardiovascular: Heart is normal in size.  No pericardial effusion. No evidence of thoracic aortic aneurysm. Atherosclerotic calcifications of the aortic arch. Right dialysis catheter terminates in the inferior right atrium. Mediastinum/Nodes: No suspicious mediastinal lymphadenopathy. Visualized thyroid is unremarkable. Lungs/Pleura: Mild centrilobular and paraseptal emphysematous changes, upper lung predominant. Eventration of the right hemidiaphragm with overlying mild atelectasis. No focal consolidation. No suspicious pulmonary nodules. No pleural effusion or  pneumothorax. Musculoskeletal: Mild degenerative changes of the lower thoracic spine. CT ABDOMEN PELVIS FINDINGS Hepatobiliary: Unenhanced liver is unremarkable. Gallbladder is unremarkable. No intrahepatic or extrahepatic ductal dilatation. Pancreas: Within normal limits. Spleen: Within normal limits. Adrenals/Urinary Tract: Adrenal glands are within normal limits. Kidneys are notable for a 3.0 cm left lower pole renal cyst (series 3/image 76). 13 mm interpolar right renal cyst (series 3/image 62). No renal calculi or hydronephrosis. Thick-walled bladder, although underdistended. Stomach/Bowel: Stomach is within normal limits. No evidence of bowel obstruction. Appendix is not discretely visualized. Mild sigmoid diverticulosis, without evidence of diverticulitis. Vascular/Lymphatic: No evidence of abdominal aortic aneurysm. Atherosclerotic calcifications of the abdominal aorta and branch vessels. No suspicious abdominopelvic lymphadenopathy. Reproductive: Status post hysterectomy. Bilateral ovaries are within normal limits. Other: No abdominopelvic ascites. Musculoskeletal: Mild degenerative changes of the lumbar spine, most prominent at L5-S1. IMPRESSION: No evidence of acute cardiopulmonary disease. No evidence of obstruction. Appendix is not discretely visualized. Mild sigmoid diverticulosis, without evidence of diverticulitis. Additional ancillary findings as above. Aortic Atherosclerosis (ICD10-I70.0) and Emphysema (ICD10-J43.9). Pleural Electronically Signed   By: Julian Hy M.D.   On: 11/10/2020 02:50   CT Head Wo Contrast  Result Date: 11/09/2020 CLINICAL DATA:  Mental status change, hypertensive emergency EXAM: CT HEAD WITHOUT CONTRAST TECHNIQUE: Contiguous axial images were obtained from the base of the skull through the vertex without intravenous contrast. COMPARISON:  05/11/2020 FINDINGS: Brain: There is atrophy and chronic small vessel disease changes. No acute intracranial abnormality.  Specifically, no hemorrhage, hydrocephalus, mass lesion, acute infarction, or significant intracranial injury. Vascular: No hyperdense vessel or unexpected calcification. Skull: No acute calvarial abnormality. Sinuses/Orbits: No acute findings Other: None IMPRESSION: Atrophy, chronic microvascular disease. No acute intracranial abnormality. Electronically Signed   By: Rolm Baptise M.D.   On: 11/09/2020 20:26   CT CHEST WO CONTRAST  Result Date: 11/10/2020 CLINICAL DATA:  Sepsis EXAM: CT CHEST, ABDOMEN AND PELVIS WITHOUT CONTRAST TECHNIQUE: Multidetector CT imaging of the chest, abdomen and pelvis was performed following the standard protocol without IV contrast. COMPARISON:  Chest radiograph dated 11/09/2020. CT pelvis  dated 07/04/2020. CT abdomen/pelvis dated 09/05/2017. FINDINGS: CT CHEST FINDINGS Cardiovascular: Heart is normal in size.  No pericardial effusion. No evidence of thoracic aortic aneurysm. Atherosclerotic calcifications of the aortic arch. Right dialysis catheter terminates in the inferior right atrium. Mediastinum/Nodes: No suspicious mediastinal lymphadenopathy. Visualized thyroid is unremarkable. Lungs/Pleura: Mild centrilobular and paraseptal emphysematous changes, upper lung predominant. Eventration of the right hemidiaphragm with overlying mild atelectasis. No focal consolidation. No suspicious pulmonary nodules. No pleural effusion or pneumothorax. Musculoskeletal: Mild degenerative changes of the lower thoracic spine. CT ABDOMEN PELVIS FINDINGS Hepatobiliary: Unenhanced liver is unremarkable. Gallbladder is unremarkable. No intrahepatic or extrahepatic ductal dilatation. Pancreas: Within normal limits. Spleen: Within normal limits. Adrenals/Urinary Tract: Adrenal glands are within normal limits. Kidneys are notable for a 3.0 cm left lower pole renal cyst (series 3/image 76). 13 mm interpolar right renal cyst (series 3/image 62). No renal calculi or hydronephrosis. Thick-walled bladder,  although underdistended. Stomach/Bowel: Stomach is within normal limits. No evidence of bowel obstruction. Appendix is not discretely visualized. Mild sigmoid diverticulosis, without evidence of diverticulitis. Vascular/Lymphatic: No evidence of abdominal aortic aneurysm. Atherosclerotic calcifications of the abdominal aorta and branch vessels. No suspicious abdominopelvic lymphadenopathy. Reproductive: Status post hysterectomy. Bilateral ovaries are within normal limits. Other: No abdominopelvic ascites. Musculoskeletal: Mild degenerative changes of the lumbar spine, most prominent at L5-S1. IMPRESSION: No evidence of acute cardiopulmonary disease. No evidence of obstruction. Appendix is not discretely visualized. Mild sigmoid diverticulosis, without evidence of diverticulitis. Additional ancillary findings as above. Aortic Atherosclerosis (ICD10-I70.0) and Emphysema (ICD10-J43.9). Pleural Electronically Signed   By: Julian Hy M.D.   On: 11/10/2020 02:50   CT Angio Chest Pulmonary Embolism (PE) W or WO Contrast  Result Date: 11/10/2020 CLINICAL DATA:  PE suspected, high prob EXAM: CT ANGIOGRAPHY CHEST WITH CONTRAST TECHNIQUE: Multidetector CT imaging of the chest was performed using the standard protocol during bolus administration of intravenous contrast. Multiplanar CT image reconstructions and MIPs were obtained to evaluate the vascular anatomy. CONTRAST:  9mL OMNIPAQUE IOHEXOL 350 MG/ML SOLN COMPARISON:  Same day chest CT. FINDINGS: Cardiovascular: Normal cardiac size.No pericardial disease.There is a pulmonary embolism within a left upper lobe segmental pulmonary artery (series 6, images 60-57). RV: LV ratio is 0.8.Moderate calcifications of the thoracic aorta. Right neck approach dialysis catheter terminates in near the inferior cavoatrial junction. Mediastinum/Nodes: No lymphadenopathy.The thyroid is unremarkable.Esophagus is unremarkable.The trachea is unremarkable. Lungs/Pleura: Central  airways are clear. The trachea is unremarkable. Mild centrilobular and paraseptal emphysematous change. Right basilar atelectasis. There is no focal airspace consolidation.No suspicious pulmonary nodules or masses.No pleural effusion.No pneumothorax. Upper Abdomen: No acute abnormality. Musculoskeletal: No acute osseous abnormality.No suspicious lytic or blastic lesions. Multilevel degenerative changes of the spine. Review of the MIP images confirms the above findings. IMPRESSION: Acute pulmonary embolism within the left upper lobe segmental pulmonary artery. No evidence of right heart strain. Aortic Atherosclerosis (ICD10-I70.0) and Emphysema (ICD10-J43.9). Critical Value/emergent results were called by telephone at the time of interpretation on 11/10/2020 at 11:34 am to provider [facility] , who verbally acknowledged these results. Electronically Signed   By: Maurine Simmering M.D.   On: 11/10/2020 11:35   DG CHEST PORT 1 VIEW  Result Date: 11/09/2020 CLINICAL DATA:  Syncope, seizure EXAM: PORTABLE CHEST 1 VIEW COMPARISON:  09/23/2020 FINDINGS: Right dialysis catheter remains in stable position with the tip in the lower right atrium. Heart is normal size. Lungs clear. No effusions or pneumothorax. No acute bony abnormality. IMPRESSION: No active disease. Electronically Signed   By: Lennette Bihari  [place] M.D.   On: 11/09/2020 18:10    Dialysis Orders: Center: [facility]  on TTS . 180NRe, 4 hours, BFR 400, DFR 500, EDW 77kg 4K, 2.25Ca, TDC Heparin 6000 unit bolus Mircera 24mcg IV q 2 weeks- last dose 10/31/20 Hectorol 1mcg IV q HD  Assessment/Plan:  Syncope: Hypotensive on arrival despite no UF with HD, given 1.5L IVF total. CTA now showing PE PE: heparin ordered, management per primary team  ESRD:  TTS schedule, no absolute indications for RRT today. Will plan next HD tomorrow, continue TTS schedule  Hypertension/volume: Hypotensive on arrival, given 1.5L UF and now appears close to euvolemic, no edema on CT.  Minimal UF with HD tomorrow  Anemia: Hgb 11.6 -> 9.0. Monitor trend, not due for ESA yet  Metabolic bone disease: Calcium controlled, no phos reported yet. Resume binders once eating.    Anice Paganini, PA-C 11/10/2020, 12:47 PM  [facility] Pager: [phone]

## 2020-11-10 NOTE — Consult Note (Signed)
NAME:  Joann Gutierrez, MRN:  [id number], DOB:  30-Nov-1945, LOS: 1 ADMISSION DATE:  11/09/2020, CONSULTATION DATE:  11/10/2020 REFERRING MD:  Shela Leff, MD, CHIEF COMPLAINT:  syncope   History of Present Illness:  Joann Gutierrez is a 75 y.o. woman with ESRD, DM2, CAD who presented yesterday after having syncopal vs seizure like event in dialysis. She reportedly received brief chest compressions but no medications administered. She was found to be hypocglycemic at this time. No fluid was removed with dialysis and she was brought to the ED and found to be hypotensive and given IVF. The patient is also on [facility] chronically and has OSA but does not use CPAP she has advanced dementia. Upon work up for her hypoxemia requiring 5LNC a CTPE study was ordered and positive for acute PE, without right heart strain. She also has large DVT. PCCM being consulted to see if any other recommendations can be made in her care.   Please see documentation above per Dr. Shearon Stalls.  Consult needed to be handed off to the night team.  I saw and spoke with the patient's daughter and family at bedside.  Patient is relatively nonverbal.  She does communicate some at baseline.  She is immobile at baseline, bedbound due to advanced dementia.  During dialysis she had brief chest compressions for a syncopal episode with seizure-like activity confirmed by family.  She was found to be hypoglycemic at that time.  CTA of the chest has a small PE and she also has DVT.  Pertinent  Medical History  ESRD DM2 CAD  Significant Hospital Events: Including procedures, antibiotic start and stop dates in addition to other pertinent events     Interim History / Subjective:    Objective   Blood pressure 100/76, pulse 76, temperature 98 F (36.7 C), temperature source Axillary, resp. rate 18, height 5\' 4"  (1.626 m), weight 99 kg, SpO2 (!) 83 %.        Intake/Output Summary (Last 24 hours) at 11/10/2020 1554 Last data  filed at 11/10/2020 1155 Gross per 24 hour  Intake 200 ml  Output --  Net 200 ml   Filed Weights   11/10/20 1200  Weight: 99 kg    Examination: General: Elderly, chronic appearing female, resting in bed alert to voice on 2 L nasal cannula HENT: NCAT, tracking appropriately Lungs: Clear to auscultation bilaterally no crackles no wheeze Cardiovascular: Regular rhythm, tachycardic, S1-S2 Abdomen: Soft nontender nondistended Extremities: Bilateral lower extremity edema Neuro: Moves her upper extremities I could not get her to move her lower extremities GU: Deferred  CTA chest, Images reviewed. Patient has a left upper lobe segmental pulmonary embolism with no evidence of right ventricular strain.  Echo march 2022 shows Grade 1 diastolic dysfunction, hyperdynamic LV, moderately elevated RVSP  Resolved Hospital Problem list     Assessment & Plan:   Hypoxemic respiratory failure requiring nasal cannula O2 supplementation Acute segmental pulmonary embolism within the right upper lobe pulmonary artery, without evidence of right ventricular strain Question of syncope versus seizure-like activity, patient was found to be hypoglycemic at the time of the event in the setting of a history of CVA and advanced dementia. OSA on CPAP Stroke in March 2022  Plan: She has multiple confounding factors for the syncopal/seizure-like activity event.  I suspect its related to her hypoglycemia and her actively undergoing hemodialysis. I do did not think that the segmental PE was the cause of this event based on history. She is tolerating anticoagulation  with heparin alone at this time. I would continue heparin alone. No current indication for any other intervention.  Troponins are low.  BNP is unreliable in the setting of ESRD.  If she has any further decline to include rapid oxygen requirement, hypotension or any other change concerning for worsening RV failure let us know we can consider  additional options for intervention.  I think she would be very high risk to receive systemic tPA due to her history of recent stroke however would need to weigh those pros and cons if she was to decline.  I did discuss this with family at bedside.  They understand I believe and are okay with continuing anticoagulation with heparin alone at this time.  We appreciate consultation.  Labs   CBC: Recent Labs  Lab 11/09/20 1728 11/10/20 0227 11/10/20 0249  WBC 13.0*  --  12.5*  NEUTROABS 11.2*  --   --   HGB 9.9* 11.6* 9.0*  HCT 31.1* 34.0* 28.4*  MCV 74.0*  --  74.5*  PLT 183  --  146*    Basic Metabolic Panel: Recent Labs  Lab 11/09/20 1728 11/10/20 0227 11/10/20 0916  NA 138 140 139  K 3.9 3.8 3.8  CL 101  --  104  CO2 19*  --  25  GLUCOSE 172*  --  96  BUN 8  --  14  CREATININE 3.03*  --  3.84*  CALCIUM 7.6*  --  8.1*   GFR: Estimated Creatinine Clearance: 14.5 mL/min (A) (by C-G formula based on SCr of 3.84 mg/dL (H)). Recent Labs  Lab 11/09/20 1728 11/09/20 1729 11/09/20 1955 11/10/20 0249  PROCALCITON  --   --   --  8.79  WBC 13.0*  --   --  12.5*  LATICACIDVEN  --  8.4* 6.9* 1.4    Liver Function Tests: Recent Labs  Lab 11/09/20 1728  AST 26  ALT 12  ALKPHOS 94  BILITOT 1.0  PROT 5.6*  ALBUMIN 2.3*   No results for input(s): LIPASE, AMYLASE in the last 168 hours. Recent Labs  Lab 11/10/20 0916  AMMONIA 11    ABG    Component Value Date/Time   PHART 7.521 (H) 11/10/2020 0227   PCO2ART 29.0 (L) 11/10/2020 0227   PO2ART 137 (H) 11/10/2020 0227   HCO3 23.7 11/10/2020 0227   TCO2 25 11/10/2020 0227   ACIDBASEDEF 7.0 (H) 02/16/2020 0643   O2SAT 99.0 11/10/2020 0227     Coagulation Profile: No results for input(s): INR, PROTIME in the last 168 hours.  Cardiac Enzymes: No results for input(s): CKTOTAL, CKMB, CKMBINDEX, TROPONINI in the last 168 hours.  HbA1C: Hgb A1c MFr Bld  Date/Time Value Ref Range Status  11/10/2020 02:49 AM 5.0  4.8 - 5.6 % Final    Comment:    (NOTE) Pre diabetes:          5.7%-6.4%  Diabetes:              >6.4%  Glycemic control for   <7.0% adults with diabetes   04/02/2020 05:48 AM 6.1 (H) 4.8 - 5.6 % Final    Comment:    REPEATED TO VERIFY (NOTE) Pre diabetes:          5.7%-6.4%  Diabetes:              >6.4%  Glycemic control for   <7.0% adults with diabetes     CBG: Recent Labs  Lab 11/09/20 2346 11/10/20 0409 11/10/20 0818 11/10/20 1020  11/10/20 1247  GLUCAP 138* 95 97 88 85    Review of Systems:   Unable to be fully obtained due to patient's advanced dementia  Past Medical History:  She,  has a past medical history of Adenomatous colon polyp, Allergy, Anemia, Asthma, CAD (coronary artery disease), Carpal tunnel syndrome on left, Cataract, CHF (congestive heart failure) ([place]), Chronic kidney disease (CKD), stage III (moderate) ([place]), COPD (chronic obstructive pulmonary disease) ([place]), Dementia with behavioral disturbance ([place]) (03/09/2020), Depression, Diabetes mellitus (1997), Diverticulosis, Dyspnea, Elevated diaphragm (November 2011), Esophageal dysmotility, Esophageal stricture, ESRD on hemodialysis ([place]) (03/09/2020), Gastritis, Gastroparesis (08/21/2007), GERD (gastroesophageal reflux disease), Hearing loss of both ears, Hernia, hiatal, Hyperlipidemia, Hypertension, Morbid obesity ([place]), OSTEOARTHRITIS (08/09/2006), Osteoporosis, Oxygen deficiency, PERIPHERAL NEUROPATHY, FEET (09/23/2007), RENAL INSUFFICIENCY (02/16/2009), 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   Surgical History:   Past Surgical History:  Procedure Laterality Date   ABDOMINAL HYSTERECTOMY     ARTERY BIOPSY  01/07/2011   Procedure: MINOR BIOPSY TEMPORAL ARTERY;  Surgeon: Haywood Lasso, MD;  Location: [facility];  Service: General;  Laterality: Left;  left temporal artery biopsy   AV FISTULA  PLACEMENT Right 11/16/2018   Procedure: CREATION RIGHT BRACHIOCEPHALIC FISTULA  ARTERIOVENOUS FISTULA;  Surgeon: Angelia Mould, MD;  Location: [facility] OR;  Service: Vascular;  Laterality: Right;   bil foot surgery     BREAST LUMPECTOMY     benign   BREAST LUMPECTOMY     both breast lumps removed    CATARACT EXTRACTION Left 06/2016   Dr. Read Drivers   COLONOSCOPY     COLONOSCOPY WITH PROPOFOL N/A 07/05/2015   Procedure: COLONOSCOPY WITH PROPOFOL;  Surgeon: Manus Gunning, MD;  Location: [facility];  Service: Gastroenterology;  Laterality: N/A;   COLONOSCOPY WITH PROPOFOL N/A 08/04/2018   Procedure: COLONOSCOPY WITH PROPOFOL;  Surgeon: Yetta Flock, MD;  Location: [facility];  Service: Gastroenterology;  Laterality: N/A;   ERD  08/08/2000   ESOPHAGEAL MANOMETRY N/A 03/13/2015   Procedure: ESOPHAGEAL MANOMETRY (EM);  Surgeon: Manus Gunning, MD;  Location: [facility];  Service: Gastroenterology;  Laterality: N/A;   ESOPHAGOGASTRODUODENOSCOPY  06/25/2006   ESOPHAGOGASTRODUODENOSCOPY (EGD) WITH PROPOFOL N/A 07/05/2015   Procedure: ESOPHAGOGASTRODUODENOSCOPY (EGD) WITH PROPOFOL;  Surgeon: Manus Gunning, MD;  Location: [facility];  Service: Gastroenterology;  Laterality: N/A;   FISTULA SUPERFICIALIZATION Right 11/16/2018   Procedure: Fistula Superficialization;  Surgeon: Angelia Mould, MD;  Location: [facility] OR;  Service: Vascular;  Laterality: Right;   HERNIA REPAIR     IR PERC TUN PERIT CATH WO PORT S&I /IMAG  02/17/2020   IR US GUIDE VASC ACCESS RIGHT  02/18/2020   LEFT AND RIGHT HEART CATHETERIZATION WITH CORONARY ANGIOGRAM N/A 03/03/2013   Procedure: LEFT AND RIGHT HEART CATHETERIZATION WITH CORONARY ANGIOGRAM;  Surgeon: Minus Breeding, MD;  Location: [facility] CATH LAB;  Service: Cardiovascular;  Laterality: N/A;   REPLACEMENT TOTAL KNEE Left 1998   UPPER GASTROINTESTINAL ENDOSCOPY       Social History:   reports that she quit smoking about 40 years ago.  She has a 5.00 pack-year smoking history. She has never used smokeless tobacco. She reports that she does not drink alcohol and does not use drugs.   Family History:  Her family history includes Allergies in her sister; Cancer in her brother; Colon cancer in her brother; Diabetes in her mother, sister, and sister; Heart attack in her father; Heart disease in her mother,  sister, and sister; Heart disease (age of onset: 36) in her father; Kidney disease in her mother, sister, and sister; Liver cancer in her mother; Sickle cell anemia in her father. There is no history of Esophageal cancer, Rectal cancer, Stomach cancer, Amblyopia, Blindness, Glaucoma, Macular degeneration, Retinal detachment, Cataracts, Strabismus, or Retinitis pigmentosa.   Allergies Allergies  Allergen Reactions   Morphine And Related Other (See Comments)    Family request not to be given, reports pt does not wake up when given    Promethazine Hcl Other (See Comments)    REACTION: lethargy     Home Medications  Prior to Admission medications   Medication Sig Start Date End Date Taking? Authorizing Provider  albuterol (VENTOLIN HFA) 108 (90 Base) MCG/ACT inhaler Inhale 2 puffs into the lungs every 4 (four) hours as needed for wheezing or shortness of breath. Patient not taking: No sig reported 04/08/20   Jonetta Osgood, MD  ascorbic acid (VITAMIN C) 500 MG tablet Take 1 tablet (500 mg total) by mouth daily. Patient not taking: No sig reported 04/08/20   Jonetta Osgood, MD  aspirin EC 81 MG tablet Take 1 tablet (81 mg total) by mouth daily. For 2 weeks-along with Plavix-after 2 weeks-stop aspirin. Patient not taking: No sig reported 04/08/20   Jonetta Osgood, MD  azelastine (ASTELIN) 0.1 % nasal spray Place 1 spray into both nostrils 2 (two) times daily as needed (allergies). Use in each nostril as directed Patient not taking: No sig reported 04/08/20   Ghimire, Henreitta Leber, MD  bisacodyl (DULCOLAX) 5 MG EC tablet Take 1  tablet (5 mg total) by mouth daily as needed for moderate constipation. Patient not taking: No sig reported 04/08/20   Jonetta Osgood, MD  calcium carbonate (OSCAL) 1500 (600 Ca) MG TABS tablet Take 1 tablet (1,500 mg total) by mouth daily. Patient not taking: No sig reported 04/08/20   Jonetta Osgood, MD  clopidogrel (PLAVIX) 75 MG tablet Take 1 tablet (75 mg total) by mouth daily. Patient not taking: No sig reported 04/08/20   Jonetta Osgood, MD  donepezil (ARICEPT) 5 MG tablet Take 1 tablet (5 mg total) by mouth at bedtime. Patient not taking: No sig reported 04/08/20   Jonetta Osgood, MD  ezetimibe (ZETIA) 10 MG tablet Take 1 tablet (10 mg total) by mouth daily. Patient not taking: No sig reported 04/08/20   Jonetta Osgood, MD  ferrous sulfate 325 (65 FE) MG tablet Take 1 tablet (325 mg total) by mouth daily. Patient not taking: No sig reported 04/08/20   Jonetta Osgood, MD  Fluticasone-Umeclidin-Vilant (TRELEGY ELLIPTA) 100-62.5-25 MCG/INH AEPB Take 1 puff by mouth daily. Patient not taking: No sig reported 04/08/20   Ghimire, Henreitta Leber, MD  guaiFENesin-dextromethorphan (ROBITUSSIN DM) 100-10 MG/5ML syrup Take 10 mLs by mouth every 4 (four) hours as needed for cough. Patient not taking: No sig reported 03/02/20   Raiford Noble Latif, DO  Ipratropium-Albuterol (COMBIVENT) 20-100 MCG/ACT AERS respimat Inhale 1 puff into the lungs every 6 (six) hours as needed for wheezing or shortness of breath. Patient not taking: Reported on 11/10/2020 04/08/20 05/08/20  Jonetta Osgood, MD  lip balm (CARMEX) ointment Apply topically as needed for lip care. Patient not taking: No sig reported 03/02/20   Raiford Noble Latif, DO  montelukast (SINGULAIR) 10 MG tablet Take 1 tablet (10 mg total) by mouth daily. Patient not taking: No sig reported 04/08/20   Jonetta Osgood, MD  nitroGLYCERIN (  NITROSTAT) 0.4 MG SL tablet Place 1 tablet (0.4 mg total) under the tongue every 5 (five) minutes as  needed for chest pain. Patient not taking: No sig reported 04/08/20   Ghimire, Henreitta Leber, MD  OLANZapine zydis (ZYPREXA) 5 MG disintegrating tablet Take 1 tablet (5 mg total) by mouth at bedtime. Patient not taking: No sig reported 04/07/20   Jonetta Osgood, MD  oxyCODONE-acetaminophen (PERCOCET/ROXICET) 5-325 MG tablet Take 1 tablet by mouth every 6 (six) hours as needed for severe pain. Patient not taking: No sig reported 06/20/20   Isla Pence, MD  OXYGEN Inhale 2 L into the lungs as needed. CPAP with oxygen at bedtime    [provider]  polyethylene glycol (MIRALAX) 17 g packet Take 17 g by mouth daily as needed (constipation.). Patient not taking: No sig reported 04/08/20   Jonetta Osgood, MD  Vitamin D3 (VITAMIN D) 25 MCG tablet Take 1 tablet (1,000 Units total) by mouth daily. Patient not taking: Reported on 11/10/2020 04/08/20   Jonetta Osgood, MD    Garner Nash, DO [facility] 11/10/2020 8:49 PM

## 2020-11-10 NOTE — Progress Notes (Addendum)
ANTICOAGULATION CONSULT NOTE - Initial Consult  Pharmacy Consult for heparin Indication: pulmonary embolus  Allergies  Allergen Reactions   Morphine And Related Other (See Comments)    Family request not to be given, reports pt does not wake up when given    Promethazine Hcl Other (See Comments)    REACTION: lethargy    Patient Measurements: Height: 5\' 4"  (162.6 cm) Weight: 99 kg (218 lb 4.1 oz) IBW/kg (Calculated) : 54.7 Heparin Dosing Weight: 77.6 kg  Vital Signs: Temp: 97.6 F (36.4 C) (10/21 2054) Temp Source: Axillary (10/21 2054) BP: 134/73 (10/21 2054) Pulse Rate: 95 (10/21 2054)  Labs: Recent Labs    11/09/20 1728 11/09/20 1929 11/10/20 0227 11/10/20 0249 11/10/20 0916 11/10/20 1912 11/10/20 2047  HGB 9.9*  --  11.6* 9.0*  --   --   --   HCT 31.1*  --  34.0* 28.4*  --   --   --   PLT 183  --   --  146*  --   --   --   APTT  --   --   --   --   --  >200*  --   LABPROT  --   --   --   --   --  19.7*  --   INR  --   --   --   --   --  1.7*  --   HEPARINUNFRC  --   --   --   --   --  >1.10* >1.10*  CREATININE 3.03*  --   --   --  3.84*  --   --   TROPONINIHS 27* 25*  --   --   --   --   --      Estimated Creatinine Clearance: 14.5 mL/min (A) (by C-G formula based on SCr of 3.84 mg/dL (H)).   Medical History: Past Medical History:  Diagnosis Date   Adenomatous colon polyp    Allergy    Anemia    Asthma        CAD (coronary artery disease)    Mild very minimal coronary disease with 20% obtuse marginal stenosis   Carpal tunnel syndrome on left    Cataract    CHF (congestive heart failure) (HCC)    Chronic kidney disease (CKD), stage III (moderate) (HCC)    now stage 4   COPD (chronic obstructive pulmonary disease) (HCC)    Dementia with behavioral disturbance ([place]) 03/09/2020   Depression    Diabetes mellitus 1997   Type II    Diverticulosis    Dyspnea    Elevated diaphragm November 2011   Right side   Esophageal dysmotility    Esophageal  stricture    ESRD on hemodialysis ([place]) 03/09/2020   Gastritis    Gastroparesis 08/21/2007   GERD (gastroesophageal reflux disease)    Hearing loss of both ears    Hernia, hiatal    Hyperlipidemia    Hypertension    Morbid obesity ([place])    OSTEOARTHRITIS 08/09/2006   Osteoporosis    Oxygen deficiency    prn    PERIPHERAL NEUROPATHY, FEET 09/23/2007   RENAL INSUFFICIENCY 02/16/2009   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   Seizures ([place])    pt thinks it has been several monthes since she had a seisure   Shingles    Sickle cell trait ([place])    Sleep apnea    uses cpap   Stroke [facility])     Medications: see  MAR  Assessment: 75 yo F with acute PE in L upper lobe, no RHS. CBC wnl - PLTc 146 - runs mid 100s-200s usually. No s/sx of bleeding. No AC PTA.   10/21 HL >1.1 x2 (on 1250 units/hr, hold nurse redraw to confirm) APTT >200 No signs/symptoms of bleed Verified patient was not on a DOAC PTA  Goal of Therapy:  Heparin level 0.3-0.7 units/ml Monitor platelets by anticoagulation protocol: Yes   Plan:   Will hold heparin gtt x2 hours then resume at 950 units/hr Plan to obtain Heparin level with AM labs to guide further dosing Daily heparin levels and CBC ordered Continue to monitor for signs/symptoms of bleed   Thank you for allowing pharmacy to be a part of this patient's care.  Donnald Garre, PharmD Clinical Pharmacist  Please check AMION for all [place] numbers After 10:00 PM, call [place] [phone]

## 2020-11-10 NOTE — ED Notes (Signed)
This RN spoke with Dr. Tyrell Antonio who came down to see pt. This RN informed admitting that we lost IV access and had difficult time placing IV last night. Per IV team last night pt does not have much to work with, but admitting wants IV to try once more and then if unsuccessful then we will get CCM involved and possibly look at placing a central line. This RN will continue to monitor.

## 2020-11-10 NOTE — Progress Notes (Signed)
ANTICOAGULATION CONSULT NOTE - Initial Consult  Pharmacy Consult for heparin Indication: pulmonary embolus  Allergies  Allergen Reactions   Morphine And Related Other (See Comments)    Family request not to be given, reports pt does not wake up when given    Promethazine Hcl Other (See Comments)    REACTION: lethargy    Patient Measurements: Height: 5\' 4"  (162.6 cm) Weight: 99 kg (218 lb 4.1 oz) IBW/kg (Calculated) : 54.7 Heparin Dosing Weight: 77.6 kg  Vital Signs: Temp: 97.3 F (36.3 C) (10/21 0906) Temp Source: Axillary (10/21 0906) BP: 123/43 (10/21 1155) Pulse Rate: 90 (10/21 1155)  Labs: Recent Labs    11/09/20 1728 11/09/20 1929 11/10/20 0227 11/10/20 0249 11/10/20 0916  HGB 9.9*  --  11.6* 9.0*  --   HCT 31.1*  --  34.0* 28.4*  --   PLT 183  --   --  146*  --   CREATININE 3.03*  --   --   --  3.84*  TROPONINIHS 27* 25*  --   --   --     Estimated Creatinine Clearance: 14.5 mL/min (A) (by C-G formula based on SCr of 3.84 mg/dL (H)).   Medical History: Past Medical History:  Diagnosis Date   Adenomatous colon polyp    Allergy    Anemia    Asthma        CAD (coronary artery disease)    Mild very minimal coronary disease with 20% obtuse marginal stenosis   Carpal tunnel syndrome on left    Cataract    CHF (congestive heart failure) (HCC)    Chronic kidney disease (CKD), stage III (moderate) (HCC)    now stage 4   COPD (chronic obstructive pulmonary disease) (HCC)    Dementia with behavioral disturbance ([place]) 03/09/2020   Depression    Diabetes mellitus 1997   Type II    Diverticulosis    Dyspnea    Elevated diaphragm November 2011   Right side   Esophageal dysmotility    Esophageal stricture    ESRD on hemodialysis ([place]) 03/09/2020   Gastritis    Gastroparesis 08/21/2007   GERD (gastroesophageal reflux disease)    Hearing loss of both ears    Hernia, hiatal    Hyperlipidemia    Hypertension    Morbid obesity ([place])    OSTEOARTHRITIS  08/09/2006   Osteoporosis    Oxygen deficiency    prn    PERIPHERAL NEUROPATHY, FEET 09/23/2007   RENAL INSUFFICIENCY 02/16/2009   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   Seizures ([place])    pt thinks it has been several monthes since she had a seisure   Shingles    Sickle cell trait ([place])    Sleep apnea    uses cpap   Stroke ([place])     Medications: see MAR  Assessment: 75 yo F with acute PE in L upper lobe, no RHS. CBC wnl - PLTc 146 - runs mid 100s-200s usually. No s/sx of bleeding. No AC PTA.   Goal of Therapy:  Heparin level 0.3-0.7 units/ml Monitor platelets by anticoagulation protocol: Yes   Plan: Give 4500 units bolus x 1 Start heparin infusion at 1250 units/hr Check anti-Xa level in 8 hours and daily while on heparin Continue to monitor H&H and platelets F/u plan for PO AC on discharge  [name], PharmD, [facility] Emergency Medicine Clinical Pharmacist ED RPh Phone: [place]: [phone]

## 2020-11-10 NOTE — Plan of Care (Signed)
Returned PCCM consult page regarding care for this patient.  It was requested to only speak physician to physician, not with APP. After acquiring the clinical question from primary team to appropriately triage this question, I have asked a PCCM physician to return the call and a physician will provide the consultation for this patient if indicated   Eliseo Gum MSN, AGACNP-BC [facility] 11/10/2020, 3:43 PM

## 2020-11-10 NOTE — ED Notes (Signed)
Admitting paged to RN per her request 

## 2020-11-10 NOTE — Progress Notes (Signed)
Patient is currently in restraints and is unable to wear CPAP at this time.

## 2020-11-10 NOTE — Progress Notes (Signed)
Unable to get EEG at this time due to lack of cooperation and agitation. Will follow up this afternoon or when our schedule permits

## 2020-11-10 NOTE — Progress Notes (Signed)
Lower extremity venous has been completed.   Preliminary results in CV Proc.  Results given to [facility], MD.   Archie Patten 11/10/2020 3:11 PM

## 2020-11-10 NOTE — Procedures (Signed)
Interventional Radiology Procedure Note  Procedure: Central venous catheter placement  Findings: Please refer to procedural dictation for full description.  Left IJ triple lumen central venous catheter placed, tip near the cavoatrial junction.    Complications: None immediate  Estimated Blood Loss: < 5 mL  Recommendations: Catheter ready for immediate use.   Ruthann Cancer, MD

## 2020-11-10 NOTE — ED Notes (Signed)
Patient transported to CT 

## 2020-11-10 NOTE — ED Notes (Signed)
Pt yelling at this RN. Pt stating "do not touch me, you dont touch people like that." Pt aggressive and hitting and pulling on this RN. This RN helped lab get pt's blood. Pt was hitting staff and yelling the entire time. Will continue to monitor.

## 2020-11-10 NOTE — Progress Notes (Signed)
Per RN, patient is still uncooperative and combative.  She is in IR.  We will re-attempt.

## 2020-11-10 NOTE — Progress Notes (Signed)
PROGRESS NOTE    Joann Gutierrez  NFA:[id number] DOB: August 22, 1945 DOA: 11/09/2020 PCP: Andree Moro, DO   Brief Narrative: 75 year old with past medical history significant for ESRD on hemodialysis TTS, anemia, asthma, CAD, chronic diastolic heart failure, pulmonary hypertension, COPD advanced dementia, chronic ambulatory dysfunction, depression, type 2 diabetes, seizure, chronic respiratory failure on home oxygen, OSA on CPAP, CVA presents to the ED via EMS from her hemodialysis center after she completed full treatment, no fluid was removed.  After patient completed treatment she had a witnessed syncopal episode with seizure-like activity.  Brief episode of apnea, chest compressions started after which patient wake up.  Hypoglycemic CBG 53.  She was also hypotensive 53/27 when EMS arrived.  She received IV bolus.  In the ED blood pressure was 82/54.  She received fever IV fluids.  Patient has had poor oral intake on and off worse over the last 2 days.  Subsequently blood pressure improved after IV fluids in the ED.     Assessment & Plan:   Principal Problem:   Syncope Active Problems:   Acute on chronic respiratory failure (HCC)   Severe sepsis (HCC)   Hypotension   Hypoglycemia   1-Acute pulmonary embolism and left upper lobe, No right side heart strain per CT -Patient initially presented with worsening hypoxemia and hypotension. -Currently her blood pressure is stable 130s range. -Troponin not significantly elevated at 25 and 27. -She has been started on IV heparin drip, will continue -Doppler lower extremity ordered/ -Patient stable on 3 L -Echo ordered.  2-SIRS: Patient presented with hypotension, tachycardia, lactic acidosis with lactic acid at 8, leukocytosis Related to hypotension, hypoperfusion, from PE. Still in the differential is sepsis.  We will continue with broad-spectrum antibiotics. Follow blood cultures CT chest Negative for pneumonia, CT abdomen no  evidence of infectious process.  3-Acute on chronic hypoxic respiratory failure: He required 5 L of oxygen on admission.  Currently on 3 L.  She is also on chronic oxygen at home. Likely related to PE.    4-Recurrent Hypoglycemia: Type 2 diabetes: Not on insulin or hypoglycemic agent. Poor oral intake. Per family information.  Continue with D10  5-mild troponin elevation: Likely demand, PE, mildly elevation  6-ESRD on hemodialysis TTS: Nephrology consulted Chronic diastolic heart failure: Volume management with dialysis COPD: Continue with DuoNeb OSA: Continue with CPAP Dementia: Delirium: Patient gets agitated at times and sometimes hits staff Delirium precaution. Questionable seizure: Likely related to hypoperfusion rather than real seizure EEG Ordered.   Estimated body mass index is 37.46 kg/m as calculated from the following:   Height as of this encounter: 5\' 4"  (1.626 m).   Weight as of this encounter: 99 kg.   DVT prophylaxis: Heparin drip Code Status: Full code Family Communication: Boyfriend at bedside Disposition Plan:  Status is: Inpatient  Remains inpatient appropriate because: Patient presented with hypotension, questionable sepsis, acute PE        Consultants:  Nephrology  Procedures:  Echo Doppler lower extremity  Antimicrobials:    Subjective: Patient was alert, she told me don't put  your cold hands on me. Per significant other, patient has period of poor oral intake but worse lately. She did not want to answer questions Objective: Vitals:   11/10/20 1155 11/10/20 1200 11/10/20 1315 11/10/20 1334  BP: (!) 123/43  130/64 137/62  Pulse: 90  89 84  Resp: 13  18 15   Temp:    98.2 F (36.8 C)  TempSrc:    Axillary  SpO2: 98%  98% 98%  Weight:  99 kg    Height:  5\' 4"  (1.626 m)      Intake/Output Summary (Last 24 hours) at 11/10/2020 1359 Last data filed at 11/10/2020 1155 Gross per 24 hour  Intake 200 ml  Output --  Net 200 ml    Filed Weights   11/10/20 1200  Weight: 99 kg    Examination:  General exam: Appears calm and comfortable  Respiratory system: Lateral air movement sporadic crackles.  Cardiovascular system: S1 & S2 heard, RRR Gastrointestinal system: Abdomen is nondistended, soft and nontender. No organomegaly or masses felt. Normal bowel sounds heard. Central nervous system: Alert  Extremities: Trace edema  Data Reviewed: I have personally reviewed following labs and imaging studies  CBC: Recent Labs  Lab 11/09/20 1728 11/10/20 0227 11/10/20 0249  WBC 13.0*  --  12.5*  NEUTROABS 11.2*  --   --   HGB 9.9* 11.6* 9.0*  HCT 31.1* 34.0* 28.4*  MCV 74.0*  --  74.5*  PLT 183  --  329*   Basic Metabolic Panel: Recent Labs  Lab 11/09/20 1728 11/10/20 0227 11/10/20 0916  NA 138 140 139  K 3.9 3.8 3.8  CL 101  --  104  CO2 19*  --  25  GLUCOSE 172*  --  96  BUN 8  --  14  CREATININE 3.03*  --  3.84*  CALCIUM 7.6*  --  8.1*   GFR: Estimated Creatinine Clearance: 14.5 mL/min (A) (by C-G formula based on SCr of 3.84 mg/dL (H)). Liver Function Tests: Recent Labs  Lab 11/09/20 1728  AST 26  ALT 12  ALKPHOS 94  BILITOT 1.0  PROT 5.6*  ALBUMIN 2.3*   No results for input(s): LIPASE, AMYLASE in the last 168 hours. Recent Labs  Lab 11/10/20 0916  AMMONIA 11   Coagulation Profile: No results for input(s): INR, PROTIME in the last 168 hours. Cardiac Enzymes: No results for input(s): CKTOTAL, CKMB, CKMBINDEX, TROPONINI in the last 168 hours. BNP (last 3 results) No results for input(s): PROBNP in the last 8760 hours. HbA1C: Recent Labs    11/10/20 0249  HGBA1C 5.0   CBG: Recent Labs  Lab 11/09/20 2346 11/10/20 0409 11/10/20 0818 11/10/20 1020 11/10/20 1247  GLUCAP 138* 95 97 88 85   Lipid Profile: No results for input(s): CHOL, HDL, LDLCALC, TRIG, CHOLHDL, LDLDIRECT in the last 72 hours. Thyroid Function Tests: No results for input(s): TSH, T4TOTAL, FREET4,  T3FREE, THYROIDAB in the last 72 hours. Anemia Panel: Recent Labs    11/10/20 0916  [id number]   Sepsis Labs: Recent Labs  Lab 11/09/20 1729 11/09/20 1955 11/10/20 0249  PROCALCITON  --   --  8.79  LATICACIDVEN 8.4* 6.9* 1.4    Recent Results (from the past 240 hour(s))  Blood culture (routine x 2)     Status: None (Preliminary result)   Collection Time: 11/09/20  5:34 PM   Specimen: BLOOD  Result Value Ref Range Status   Specimen Description BLOOD LEFT ANTECUBITAL  Final   Special Requests   Final    BOTTLES DRAWN AEROBIC AND ANAEROBIC Blood Culture adequate volume   Culture   Final    NO GROWTH < 24 HOURS Performed at [facility], [place] [address] [place], [place] [address]    Report Status PENDING  Incomplete  Resp Panel by RT-PCR (Flu A&B, Covid) Nasopharyngeal Swab     Status: None   Collection Time: 11/09/20  7:24 PM   Specimen: Nasopharyngeal Swab; Nasopharyngeal(NP) swabs in vial transport medium  Result Value Ref Range Status   SARS Coronavirus 2 by RT PCR NEGATIVE NEGATIVE Final    Comment: (NOTE) SARS-CoV-2 target nucleic acids are NOT DETECTED.  The SARS-CoV-2 RNA is generally detectable in upper respiratory specimens during the acute phase of infection. The lowest concentration of SARS-CoV-2 viral copies this assay can detect is 138 copies/mL. A negative result does not preclude SARS-Cov-2 infection and should not be used as the sole basis for treatment or other patient management decisions. A negative result may occur with  improper specimen collection/handling, submission of specimen other than nasopharyngeal swab, presence of viral mutation(s) within the areas targeted by this assay, and inadequate number of viral copies(<138 copies/mL). A negative result must be combined with clinical observations, patient history, and epidemiological information. The expected result is Negative.  Fact Sheet for Patients:   EntrepreneurPulse.com.au  Fact Sheet for Healthcare Providers:  IncredibleEmployment.be  This test is no t yet approved or cleared by the [place] FDA and  has been authorized for detection and/or diagnosis of SARS-CoV-2 by FDA under an Emergency Use Authorization (EUA). This EUA will remain  in effect (meaning this test can be used) for the duration of the COVID-19 declaration under Section 564(b)(1) of the Act, 21 U.S.C.section 360bbb-3(b)(1), unless the authorization is terminated  or revoked sooner.       Influenza A by PCR NEGATIVE NEGATIVE Final   Influenza B by PCR NEGATIVE NEGATIVE Final    Comment: (NOTE) The Xpert Xpress SARS-CoV-2/FLU/RSV plus assay is intended as an aid in the diagnosis of influenza from Nasopharyngeal swab specimens and should not be used as a sole basis for treatment. Nasal washings and aspirates are unacceptable for Xpert Xpress SARS-CoV-2/FLU/RSV testing.  Fact Sheet for Patients: EntrepreneurPulse.com.au  Fact Sheet for Healthcare Providers: IncredibleEmployment.be  This test is not yet approved or cleared by the [place] FDA and has been authorized for detection and/or diagnosis of SARS-CoV-2 by FDA under an Emergency Use Authorization (EUA). This EUA will remain in effect (meaning this test can be used) for the duration of the COVID-19 declaration under Section 564(b)(1) of the Act, 21 U.S.C. section 360bbb-3(b)(1), unless the authorization is terminated or revoked.  Performed at [facility], [place] [address] [place], [place] [address]          Radiology Studies: CT ABDOMEN PELVIS WO CONTRAST  Result Date: 11/10/2020 CLINICAL DATA:  Sepsis EXAM: CT CHEST, ABDOMEN AND PELVIS WITHOUT CONTRAST TECHNIQUE: Multidetector CT imaging of the chest, abdomen and pelvis was performed following the standard protocol without IV contrast. COMPARISON:  Chest  radiograph dated 11/09/2020. CT pelvis dated 07/04/2020. CT abdomen/pelvis dated 09/05/2017. FINDINGS: CT CHEST FINDINGS Cardiovascular: Heart is normal in size.  No pericardial effusion. No evidence of thoracic aortic aneurysm. Atherosclerotic calcifications of the aortic arch. Right dialysis catheter terminates in the inferior right atrium. Mediastinum/Nodes: No suspicious mediastinal lymphadenopathy. Visualized thyroid is unremarkable. Lungs/Pleura: Mild centrilobular and paraseptal emphysematous changes, upper lung predominant. Eventration of the right hemidiaphragm with overlying mild atelectasis. No focal consolidation. No suspicious pulmonary nodules. No pleural effusion or pneumothorax. Musculoskeletal: Mild degenerative changes of the lower thoracic spine. CT ABDOMEN PELVIS FINDINGS Hepatobiliary: Unenhanced liver is unremarkable. Gallbladder is unremarkable. No intrahepatic or extrahepatic ductal dilatation. Pancreas: Within normal limits. Spleen: Within normal limits. Adrenals/Urinary Tract: Adrenal glands are within normal limits. Kidneys are notable for a 3.0 cm left lower pole renal cyst (series  3/image 76). 13 mm interpolar right renal cyst (series 3/image 62). No renal calculi or hydronephrosis. Thick-walled bladder, although underdistended. Stomach/Bowel: Stomach is within normal limits. No evidence of bowel obstruction. Appendix is not discretely visualized. Mild sigmoid diverticulosis, without evidence of diverticulitis. Vascular/Lymphatic: No evidence of abdominal aortic aneurysm. Atherosclerotic calcifications of the abdominal aorta and branch vessels. No suspicious abdominopelvic lymphadenopathy. Reproductive: Status post hysterectomy. Bilateral ovaries are within normal limits. Other: No abdominopelvic ascites. Musculoskeletal: Mild degenerative changes of the lumbar spine, most prominent at L5-S1. IMPRESSION: No evidence of acute cardiopulmonary disease. No evidence of obstruction. Appendix  is not discretely visualized. Mild sigmoid diverticulosis, without evidence of diverticulitis. Additional ancillary findings as above. Aortic Atherosclerosis (ICD10-I70.0) and Emphysema (ICD10-J43.9). Pleural Electronically Signed   By: Julian Hy M.D.   On: 11/10/2020 02:50   CT Head Wo Contrast  Result Date: 11/09/2020 CLINICAL DATA:  Mental status change, hypertensive emergency EXAM: CT HEAD WITHOUT CONTRAST TECHNIQUE: Contiguous axial images were obtained from the base of the skull through the vertex without intravenous contrast. COMPARISON:  05/11/2020 FINDINGS: Brain: There is atrophy and chronic small vessel disease changes. No acute intracranial abnormality. Specifically, no hemorrhage, hydrocephalus, mass lesion, acute infarction, or significant intracranial injury. Vascular: No hyperdense vessel or unexpected calcification. Skull: No acute calvarial abnormality. Sinuses/Orbits: No acute findings Other: None IMPRESSION: Atrophy, chronic microvascular disease. No acute intracranial abnormality. Electronically Signed   By: Rolm Baptise M.D.   On: 11/09/2020 20:26   CT CHEST WO CONTRAST  Result Date: 11/10/2020 CLINICAL DATA:  Sepsis EXAM: CT CHEST, ABDOMEN AND PELVIS WITHOUT CONTRAST TECHNIQUE: Multidetector CT imaging of the chest, abdomen and pelvis was performed following the standard protocol without IV contrast. COMPARISON:  Chest radiograph dated 11/09/2020. CT pelvis dated 07/04/2020. CT abdomen/pelvis dated 09/05/2017. FINDINGS: CT CHEST FINDINGS Cardiovascular: Heart is normal in size.  No pericardial effusion. No evidence of thoracic aortic aneurysm. Atherosclerotic calcifications of the aortic arch. Right dialysis catheter terminates in the inferior right atrium. Mediastinum/Nodes: No suspicious mediastinal lymphadenopathy. Visualized thyroid is unremarkable. Lungs/Pleura: Mild centrilobular and paraseptal emphysematous changes, upper lung predominant. Eventration of the right  hemidiaphragm with overlying mild atelectasis. No focal consolidation. No suspicious pulmonary nodules. No pleural effusion or pneumothorax. Musculoskeletal: Mild degenerative changes of the lower thoracic spine. CT ABDOMEN PELVIS FINDINGS Hepatobiliary: Unenhanced liver is unremarkable. Gallbladder is unremarkable. No intrahepatic or extrahepatic ductal dilatation. Pancreas: Within normal limits. Spleen: Within normal limits. Adrenals/Urinary Tract: Adrenal glands are within normal limits. Kidneys are notable for a 3.0 cm left lower pole renal cyst (series 3/image 76). 13 mm interpolar right renal cyst (series 3/image 62). No renal calculi or hydronephrosis. Thick-walled bladder, although underdistended. Stomach/Bowel: Stomach is within normal limits. No evidence of bowel obstruction. Appendix is not discretely visualized. Mild sigmoid diverticulosis, without evidence of diverticulitis. Vascular/Lymphatic: No evidence of abdominal aortic aneurysm. Atherosclerotic calcifications of the abdominal aorta and branch vessels. No suspicious abdominopelvic lymphadenopathy. Reproductive: Status post hysterectomy. Bilateral ovaries are within normal limits. Other: No abdominopelvic ascites. Musculoskeletal: Mild degenerative changes of the lumbar spine, most prominent at L5-S1. IMPRESSION: No evidence of acute cardiopulmonary disease. No evidence of obstruction. Appendix is not discretely visualized. Mild sigmoid diverticulosis, without evidence of diverticulitis. Additional ancillary findings as above. Aortic Atherosclerosis (ICD10-I70.0) and Emphysema (ICD10-J43.9). Pleural Electronically Signed   By: Julian Hy M.D.   On: 11/10/2020 02:50   CT Angio Chest Pulmonary Embolism (PE) W or WO Contrast  Result Date: 11/10/2020 CLINICAL DATA:  PE suspected, high prob EXAM:  CT ANGIOGRAPHY CHEST WITH CONTRAST TECHNIQUE: Multidetector CT imaging of the chest was performed using the standard protocol during bolus  administration of intravenous contrast. Multiplanar CT image reconstructions and MIPs were obtained to evaluate the vascular anatomy. CONTRAST:  30mL OMNIPAQUE IOHEXOL 350 MG/ML SOLN COMPARISON:  Same day chest CT. FINDINGS: Cardiovascular: Normal cardiac size.No pericardial disease.There is a pulmonary embolism within a left upper lobe segmental pulmonary artery (series 6, images 60-57). RV: LV ratio is 0.8.Moderate calcifications of the thoracic aorta. Right neck approach dialysis catheter terminates in near the inferior cavoatrial junction. Mediastinum/Nodes: No lymphadenopathy.The thyroid is unremarkable.Esophagus is unremarkable.The trachea is unremarkable. Lungs/Pleura: Central airways are clear. The trachea is unremarkable. Mild centrilobular and paraseptal emphysematous change. Right basilar atelectasis. There is no focal airspace consolidation.No suspicious pulmonary nodules or masses.No pleural effusion.No pneumothorax. Upper Abdomen: No acute abnormality. Musculoskeletal: No acute osseous abnormality.No suspicious lytic or blastic lesions. Multilevel degenerative changes of the spine. Review of the MIP images confirms the above findings. IMPRESSION: Acute pulmonary embolism within the left upper lobe segmental pulmonary artery. No evidence of right heart strain. Aortic Atherosclerosis (ICD10-I70.0) and Emphysema (ICD10-J43.9). Critical Value/emergent results were called by telephone at the time of interpretation on 11/10/2020 at 11:34 am to provider [facility] , who verbally acknowledged these results. Electronically Signed   By: Maurine Simmering M.D.   On: 11/10/2020 11:35   DG CHEST PORT 1 VIEW  Result Date: 11/09/2020 CLINICAL DATA:  Syncope, seizure EXAM: PORTABLE CHEST 1 VIEW COMPARISON:  09/23/2020 FINDINGS: Right dialysis catheter remains in stable position with the tip in the lower right atrium. Heart is normal size. Lungs clear. No effusions or pneumothorax. No acute bony abnormality.  IMPRESSION: No active disease. Electronically Signed   By: Rolm Baptise M.D.   On: 11/09/2020 18:10        Scheduled Meds:  [START ON 11/11/2020] Chlorhexidine Gluconate Cloth  6 each Topical Q0600   thiamine injection  100 mg Intravenous Daily   vancomycin variable dose per unstable renal function (pharmacist dosing)   Does not apply See admin instructions   Continuous Infusions:  ceFEPime (MAXIPIME) IV Stopped (11/09/20 2231)   dextrose Stopped (11/10/20 0829)   heparin 1,250 Units/hr (11/10/20 1333)   metronidazole Stopped (11/10/20 1155)     LOS: 1 day    Time spent: 35 minutes.     [name], MD [facility]   If 7PM-7AM, please contact night-coverage [email or web address]  11/10/2020, 1:59 PM

## 2020-11-10 NOTE — ED Notes (Signed)
This RN went to check on pt and pt was holding IV cath in her hand and IV machine was beeping. Per night shift RN it was difficult to get an IV and took IV team placing an ultrasound but stated pt did not have much to work with and pt is right arm restricted. Will page admitting.

## 2020-11-11 ENCOUNTER — Inpatient Hospital Stay (HOSPITAL_COMMUNITY): Payer: Medicare HMO

## 2020-11-11 DIAGNOSIS — R55 Syncope and collapse: Secondary | ICD-10-CM

## 2020-11-11 LAB — HEPATITIS B SURFACE ANTIBODY,QUALITATIVE: Hep B S Ab: REACTIVE — AB

## 2020-11-11 LAB — CBC
HCT: 28.4 % — ABNORMAL LOW (ref 36.0–46.0)
HCT: 21.4 % — ABNORMAL LOW (ref 36.0–46.0)
Hemoglobin: 9 g/dL — ABNORMAL LOW (ref 12.0–15.0)
Hemoglobin: 7 g/dL — ABNORMAL LOW (ref 12.0–15.0)
MCH: 23.6 pg — ABNORMAL LOW (ref 26.0–34.0)
MCH: 23.8 pg — ABNORMAL LOW (ref 26.0–34.0)
MCHC: 31.7 g/dL (ref 30.0–36.0)
MCHC: 32.7 g/dL (ref 30.0–36.0)
MCV: 74.5 fL — ABNORMAL LOW (ref 80.0–100.0)
MCV: 72.8 fL — ABNORMAL LOW (ref 80.0–100.0)
Platelets: 146 10*3/uL — ABNORMAL LOW (ref 150–400)
Platelets: 107 10*3/uL — ABNORMAL LOW (ref 150–400)
RBC: 3.81 MIL/uL — ABNORMAL LOW (ref 3.87–5.11)
RBC: 2.94 MIL/uL — ABNORMAL LOW (ref 3.87–5.11)
RDW: 26.4 % — ABNORMAL HIGH (ref 11.5–15.5)
RDW: 26 % — ABNORMAL HIGH (ref 11.5–15.5)
WBC: 12.5 10*3/uL — ABNORMAL HIGH (ref 4.0–10.5)
WBC: 6.2 10*3/uL (ref 4.0–10.5)
nRBC: 1.4 % — ABNORMAL HIGH (ref 0.0–0.2)
nRBC: 1.3 % — ABNORMAL HIGH (ref 0.0–0.2)

## 2020-11-11 LAB — ECHOCARDIOGRAM COMPLETE
AR max vel: 2.15 cm2
AV Peak grad: 3.2 mmHg
Ao pk vel: 0.9 m/s
Area-P 1/2: 2.6 cm2
Calc EF: 76.6 %
Height: 64 in
S' Lateral: 1.6 cm
Single Plane A2C EF: 77.2 %
Single Plane A4C EF: 75.9 %
Weight: 3474.45 oz

## 2020-11-11 LAB — PREPARE RBC (CROSSMATCH)

## 2020-11-11 LAB — GLUCOSE, CAPILLARY
Glucose-Capillary: 114 mg/dL — ABNORMAL HIGH (ref 70–99)
Glucose-Capillary: 119 mg/dL — ABNORMAL HIGH (ref 70–99)
Glucose-Capillary: 114 mg/dL — ABNORMAL HIGH (ref 70–99)
Glucose-Capillary: 82 mg/dL (ref 70–99)
Glucose-Capillary: 121 mg/dL — ABNORMAL HIGH (ref 70–99)

## 2020-11-11 LAB — HEPARIN LEVEL (UNFRACTIONATED)
Heparin Unfractionated: 1.03 IU/mL — ABNORMAL HIGH (ref 0.30–0.70)
Heparin Unfractionated: 0.67 IU/mL (ref 0.30–0.70)

## 2020-11-11 MED ORDER — SODIUM CHLORIDE 0.9% IV SOLUTION: Freq: Once | INTRAVENOUS | Status: DC

## 2020-11-11 MED ORDER — VANCOMYCIN HCL IN DEXTROSE 1-5 GM/200ML-% IV SOLN
1000.0000 mg | INTRAVENOUS | Status: AC
  Administered 2020-11-11: 1000 mg via INTRAVENOUS
  Filled 2020-11-11: qty 200

## 2020-11-11 MED ORDER — HEPARIN (PORCINE) 25000 UT/250ML-% IV SOLN
650.0000 [IU]/h | INTRAVENOUS | Status: DC
  Administered 2020-11-11 – 2020-11-12 (×2): 650 [IU]/h via INTRAVENOUS
  Filled 2020-11-11 (×2): qty 250

## 2020-11-11 MED ORDER — ORAL CARE MOUTH RINSE
15.0000 mL | Freq: Two times a day (BID) | OROMUCOSAL | Status: DC
  Administered 2020-11-11 – 2020-11-21 (×9): 15 mL via OROMUCOSAL

## 2020-11-11 MED ORDER — HEPARIN SODIUM (PORCINE) 1000 UNIT/ML IJ SOLN
INTRAMUSCULAR | Status: AC
  Filled 2020-11-11: qty 4

## 2020-11-11 MED ORDER — VANCOMYCIN HCL IN DEXTROSE 1-5 GM/200ML-% IV SOLN: 1000.0000 mg | INTRAVENOUS | Status: DC

## 2020-11-11 MED ORDER — HEPARIN (PORCINE) 25000 UT/250ML-% IV SOLN
650.0000 [IU]/h | INTRAVENOUS | Status: DC
  Filled 2020-11-11: qty 250

## 2020-11-11 NOTE — Progress Notes (Signed)
ANTICOAGULATION CONSULT NOTE - Follow-up Consult  Pharmacy Consult for heparin Indication: pulmonary embolus  Allergies  Allergen Reactions   Morphine And Related Other (See Comments)    Family request not to be given, reports pt does not wake up when given    Promethazine Hcl Other (See Comments)    REACTION: lethargy    Patient Measurements: Height: 5\' 4"  (162.6 cm) Weight: 98.5 kg (217 lb 2.5 oz) IBW/kg (Calculated) : 54.7 Heparin Dosing Weight: 77.6 kg  Vital Signs: Temp: 97.8 F (36.6 C) (10/22 1945) Temp Source: Oral (10/22 1945) BP: 133/83 (10/22 1945) Pulse Rate: 90 (10/22 1945)  Labs: Recent Labs    11/09/20 1728 11/09/20 1929 11/10/20 0227 11/10/20 0249 11/10/20 0916 11/10/20 1912 11/10/20 1912 11/10/20 2047 11/11/20 0500 11/11/20 0630 11/11/20 1900  HGB 9.9*  --  11.6* 9.0*  --   --   --   --  7.0*  --   --   HCT 31.1*  --  34.0* 28.4*  --   --   --   --  21.4*  --   --   PLT 183  --   --  146*  --   --   --   --  107*  --   --   APTT  --   --   --   --   --  >200*  --  >200*  --   --   --   LABPROT  --   --   --   --   --  19.7*  --   --   --   --   --   INR  --   --   --   --   --  1.7*  --   --   --   --   --   HEPARINUNFRC  --   --   --   --   --  >1.10*   < > >1.10*  --  1.03* 0.67  CREATININE 3.03*  --   --   --  3.84*  --   --   --   --   --   --   TROPONINIHS 27* 25*  --   --   --   --   --   --   --   --   --    < > = values in this interval not displayed.     Estimated Creatinine Clearance: 14.4 mL/min (A) (by C-G formula based on SCr of 3.84 mg/dL (H)).   Medical History: Past Medical History:  Diagnosis Date   Adenomatous colon polyp    Allergy    Anemia    Asthma        CAD (coronary artery disease)    Mild very minimal coronary disease with 20% obtuse marginal stenosis   Carpal tunnel syndrome on left    Cataract    CHF (congestive heart failure) (HCC)    Chronic kidney disease (CKD), stage III (moderate) (HCC)    now  stage 4   COPD (chronic obstructive pulmonary disease) ([place])    Dementia with behavioral disturbance 03/09/2020   Depression    Diabetes mellitus 1997   Type II    Diverticulosis    Dyspnea    Elevated diaphragm November 2011   Right side   Esophageal dysmotility    Esophageal stricture    ESRD on hemodialysis ([place]) 03/09/2020   Gastritis    Gastroparesis 08/21/2007  GERD (gastroesophageal reflux disease)    Hearing loss of both ears    Hernia, hiatal    Hyperlipidemia    Hypertension    Morbid obesity ([place])    OSTEOARTHRITIS 08/09/2006   Osteoporosis    Oxygen deficiency    prn    PERIPHERAL NEUROPATHY, FEET 09/23/2007   RENAL INSUFFICIENCY 02/16/2009   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   Seizures ([place])    pt thinks it has been several monthes since she had a seisure   Shingles    Sickle cell trait ([place])    Sleep apnea    uses cpap   Stroke [facility])     Assessment:  75 yo F with acute PE in L upper lobe, no RHS. Pt started on IV heparin.  Initial levels elevated, repeat this evening is therapeutic at 0.67. Central line bleeding noted earlier today has now resolved.  Goal of Therapy:  Heparin level 0.3-0.7 units/ml Monitor platelets by anticoagulation protocol: Yes   Plan:  Continue heparin 650 units/h Daily heparin level and CBC  Arrie Senate, PharmD, [place], [facility] Clinical Pharmacist [phone] Please check AMION for all [facility] numbers 11/11/2020

## 2020-11-11 NOTE — Plan of Care (Signed)
  Problem: Safety: Goal: Ability to remain free from injury will improve Outcome: Progressing   

## 2020-11-11 NOTE — Procedures (Signed)
  Called by dialysis nurse regarding bleeding at triple lumen central line site.  Upon exam, the dressing was saturated with blood and blood was running down the patient's chest.  Removal of the dressing revealed the suture securing the line was loose and the line was mobile causing bleeding from around the site.  After using hand hygiene and putting on sterile gloves, I prepped the site with betadine and draped in the usual sterile fashion.   I used 1% lidocaine for local anesthesia. I used a 3-0 Ethilon suture to re-secure the central line in place.  I ensured there was no further bleeding from the skin site.  I cleaned the area and applied a sterile gauze and Tegaderm.   Central Line remains ok for use.  Norvel Wenker S Delyle Weider PA-C 11/11/2020 12:09 PM

## 2020-11-11 NOTE — Progress Notes (Signed)
  Echocardiogram 2D Echocardiogram has been performed.  Joann Gutierrez 11/11/2020, 1:52 PM

## 2020-11-11 NOTE — Progress Notes (Signed)
PROGRESS NOTE    Joann Gutierrez  LFY:[id number] DOB: 12-26-1945 DOA: 11/09/2020 PCP: Andree Moro, DO   Brief Narrative: 75 year old with past medical history significant for ESRD on hemodialysis TTS, anemia, asthma, CAD, chronic diastolic heart failure, pulmonary hypertension, COPD advanced dementia, chronic ambulatory dysfunction, depression, type 2 diabetes, seizure, chronic respiratory failure on home oxygen, OSA on CPAP, CVA presents to the ED via EMS from her hemodialysis center after she completed full treatment, no fluid was removed.  After patient completed treatment she had a witnessed syncopal episode with seizure-like activity.  Brief episode of apnea, chest compressions started after which patient wake up.  Hypoglycemic CBG 53.  She was also hypotensive 53/27 when EMS arrived.  She received IV bolus.  In the ED blood pressure was 82/54.  She received fever IV fluids.  Patient has had poor oral intake on and off worse over the last 2 days.  Subsequently blood pressure improved after IV fluids in the ED.    Assessment & Plan:   Principal Problem:   Syncope Active Problems:   Acute on chronic respiratory failure (HCC)   Severe sepsis (HCC)   Hypotension   Hypoglycemia   1-Acute pulmonary embolism and left upper lobe, No right side heart strain per CT -Patient initially presented with worsening hypoxemia and hypotension. -Currently her blood pressure is stable 130s range. -Troponin not significantly elevated at 25 and 27. -She has been started on IV heparin drip, will continue -Doppler lower extremity positive for bilateral extensive DVT.  -Patient stable on 3 L -Echo ordered. -Appreciate CCM evaluation, plan to continue with heparin Gtt  2-SIRS: Patient presented with hypotension, tachycardia, lactic acidosis with lactic acid at 8, leukocytosis Related to hypotension, hypoperfusion, from PE. Still in the differential is sepsis.  We will continue with broad-spectrum  antibiotics. Follow blood cultures: No growth to date.  CT chest Negative for pneumonia, CT abdomen no evidence of infectious process. Pro-calcitonin elevated.  Will discontinue vancomycin and flagyl today.   3-Acute on chronic hypoxic respiratory failure: He required 5 L of oxygen on admission.  Currently on 3 L.  She is also on chronic oxygen at home. Likely related to PE.  Stable   4-Recurrent Hypoglycemia: Type 2 diabetes: Not on insulin or hypoglycemic agent. Poor oral intake. Per family information.  Continue with D10 Monitor , if continue to have poor oral inatke, will involve palliative   5-mild troponin elevation: Likely demand, PE, mildly elevation  6-Anemia; hb down to 7. Plan to transfuse one unit during HD>   ESRD on hemodialysis TTS: Nephrology consulted , HD 10/22. Syncope; related to hypoglycemia, hypotension. Per CCM less likely relate to PE>  Chronic diastolic heart failure: Volume management with dialysis COPD: Continue with DuoNeb OSA: Continue with CPAP Dementia: Delirium: Patient gets agitated at times and sometimes hits staff Delirium precaution. Questionable seizure: Likely related to hypoperfusion rather than real seizure EEG Ordered.   Estimated body mass index is 37.27 kg/m as calculated from the following:   Height as of this encounter: _0  (1.626 m).   Weight as of this encounter: 98.5 kg.   DVT prophylaxis: Heparin drip Code Status: Full code Family Communication: Boyfriend at bedside Disposition Plan:  Status is: Inpatient  Remains inpatient appropriate because: Patient presented with hypotension, questionable sepsis, acute PE        Consultants:  Nephrology  Procedures:  Echo Doppler lower extremity  Antimicrobials:    Subjective: Alert, at baseline, non verbal.   Objective: Vitals:  11/11/20 1205 11/11/20 1220 11/11/20 1230 11/11/20 1303  BP: 106/68 121/65 (!) 135/48 132/67  Pulse: 95 85 83 89  Resp: _0 Temp: 97.6 F (36.4 C) 98 F (36.7 C)  97.6 F (36.4 C)  TempSrc: Axillary Axillary  Oral  SpO2: 100% 100% 100%   Weight:  98.5 kg    Height:        Intake/Output Summary (Last 24 hours) at 11/11/2020 1331 Last data filed at 11/11/2020 1220 Gross per 24 hour  Intake --  Output 0 ml  Net 0 ml    Filed Weights   11/10/20 1200 11/11/20 0854 11/11/20 1220  Weight: 99 kg 98.6 kg 98.5 kg    Examination:  General exam: NAD Respiratory system: BL crackles.  Cardiovascular system: S 1, S 2 RRR Gastrointestinal system: BS present, soft, nt Central nervous system: alert, follows command Extremities: Trace edema  Data Reviewed: I have personally reviewed following labs and imaging studies  CBC: Recent Labs  Lab 11/09/20 1728 11/10/20 0227 11/10/20 0249 11/11/20 0500  WBC 13.0*  --  12.5* 6.2  NEUTROABS 11.2*  --   --   --   HGB 9.9* 11.6* 9.0* 7.0*  HCT 31.1* 34.0* 28.4* 21.4*  MCV 74.0*  --  74.5* 72.8*  PLT 183  --  146* 107*    Basic Metabolic Panel: Recent Labs  Lab 11/09/20 1728 11/10/20 0227 11/10/20 0916  NA 138 140 139  K 3.9 3.8 3.8  CL 101  --  104  CO2 19*  --  25  GLUCOSE 172*  --  96  BUN 8  --  14  CREATININE 3.03*  --  3.84*  CALCIUM 7.6*  --  8.1*    GFR: Estimated Creatinine Clearance: 14.4 mL/min (A) (by C-G formula based on SCr of 3.84 mg/dL (H)). Liver Function Tests: Recent Labs  Lab 11/09/20 1728  AST 26  ALT 12  ALKPHOS 94  BILITOT 1.0  PROT 5.6*  ALBUMIN 2.3*    No results for input(s): LIPASE, AMYLASE in the last 168 hours. Recent Labs  Lab 11/10/20 0916  AMMONIA 11    Coagulation Profile: Recent Labs  Lab 11/10/20 1912  INR 1.7*   Cardiac Enzymes: No results for input(s): CKTOTAL, CKMB, CKMBINDEX, TROPONINI in the last 168 hours. BNP (last 3 results) No results for input(s): PROBNP in the last 8760 hours. HbA1C: Recent Labs    11/10/20 0249  HGBA1C 5.0    CBG: Recent Labs  Lab 11/10/20 2052  11/11/20 0100 11/11/20 0439 11/11/20 0805 11/11/20 1247  GLUCAP 94 114* 119* 114* 82    Lipid Profile: No results for input(s): CHOL, HDL, LDLCALC, TRIG, CHOLHDL, LDLDIRECT in the last 72 hours. Thyroid Function Tests: No results for input(s): TSH, T4TOTAL, FREET4, T3FREE, THYROIDAB in the last 72 hours. Anemia Panel: Recent Labs    11/10/20 0916  [id number]    Sepsis Labs: Recent Labs  Lab 11/09/20 1729 11/09/20 1955 11/10/20 0249  PROCALCITON  --   --  8.79  LATICACIDVEN 8.4* 6.9* 1.4     Recent Results (from the past 240 hour(s))  Blood culture (routine x 2)     Status: None (Preliminary result)   Collection Time: 11/09/20  5:34 PM   Specimen: BLOOD  Result Value Ref Range Status   Specimen Description BLOOD LEFT ANTECUBITAL  Final   Special Requests   Final    BOTTLES DRAWN AEROBIC AND ANAEROBIC Blood Culture adequate volume  Culture   Final    NO GROWTH 2 DAYS Performed at [facility], [place] [address] [place], [place] [address]    Report Status PENDING  Incomplete  Resp Panel by RT-PCR (Flu A&B, Covid) Nasopharyngeal Swab     Status: None   Collection Time: 11/09/20  7:24 PM   Specimen: Nasopharyngeal Swab; Nasopharyngeal(NP) swabs in vial transport medium  Result Value Ref Range Status   SARS Coronavirus 2 by RT PCR NEGATIVE NEGATIVE Final    Comment: (NOTE) SARS-CoV-2 target nucleic acids are NOT DETECTED.  The SARS-CoV-2 RNA is generally detectable in upper respiratory specimens during the acute phase of infection. The lowest concentration of SARS-CoV-2 viral copies this assay can detect is 138 copies/mL. A negative result does not preclude SARS-Cov-2 infection and should not be used as the sole basis for treatment or other patient management decisions. A negative result may occur with  improper specimen collection/handling, submission of specimen other than nasopharyngeal swab, presence of viral mutation(s) within the areas targeted by  this assay, and inadequate number of viral copies(<138 copies/mL). A negative result must be combined with clinical observations, patient history, and epidemiological information. The expected result is Negative.  Fact Sheet for Patients:  EntrepreneurPulse.com.au  Fact Sheet for Healthcare Providers:  IncredibleEmployment.be  This test is no t yet approved or cleared by the [place] FDA and  has been authorized for detection and/or diagnosis of SARS-CoV-2 by FDA under an Emergency Use Authorization (EUA). This EUA will remain  in effect (meaning this test can be used) for the duration of the COVID-19 declaration under Section 564(b)(1) of the Act, 21 U.S.C.section 360bbb-3(b)(1), unless the authorization is terminated  or revoked sooner.       Influenza A by PCR NEGATIVE NEGATIVE Final   Influenza B by PCR NEGATIVE NEGATIVE Final    Comment: (NOTE) The Xpert Xpress SARS-CoV-2/FLU/RSV plus assay is intended as an aid in the diagnosis of influenza from Nasopharyngeal swab specimens and should not be used as a sole basis for treatment. Nasal washings and aspirates are unacceptable for Xpert Xpress SARS-CoV-2/FLU/RSV testing.  Fact Sheet for Patients: EntrepreneurPulse.com.au  Fact Sheet for Healthcare Providers: IncredibleEmployment.be  This test is not yet approved or cleared by the [place] FDA and has been authorized for detection and/or diagnosis of SARS-CoV-2 by FDA under an Emergency Use Authorization (EUA). This EUA will remain in effect (meaning this test can be used) for the duration of the COVID-19 declaration under Section 564(b)(1) of the Act, 21 U.S.C. section 360bbb-3(b)(1), unless the authorization is terminated or revoked.  Performed at [facility], [place] [address], [place],  [address]           Radiology Studies: CT ABDOMEN PELVIS WO CONTRAST  Result  Date: 11/10/2020 CLINICAL DATA:  Sepsis EXAM: CT CHEST, ABDOMEN AND PELVIS WITHOUT CONTRAST TECHNIQUE: Multidetector CT imaging of the chest, abdomen and pelvis was performed following the standard protocol without IV contrast. COMPARISON:  Chest radiograph dated 11/09/2020. CT pelvis dated 07/04/2020. CT abdomen/pelvis dated 09/05/2017. FINDINGS: CT CHEST FINDINGS Cardiovascular: Heart is normal in size.  No pericardial effusion. No evidence of thoracic aortic aneurysm. Atherosclerotic calcifications of the aortic arch. Right dialysis catheter terminates in the inferior right atrium. Mediastinum/Nodes: No suspicious mediastinal lymphadenopathy. Visualized thyroid is unremarkable. Lungs/Pleura: Mild centrilobular and paraseptal emphysematous changes, upper lung predominant. Eventration of the right hemidiaphragm with overlying mild atelectasis. No focal consolidation. No suspicious pulmonary nodules. No pleural effusion or pneumothorax. Musculoskeletal:  Mild degenerative changes of the lower thoracic spine. CT ABDOMEN PELVIS FINDINGS Hepatobiliary: Unenhanced liver is unremarkable. Gallbladder is unremarkable. No intrahepatic or extrahepatic ductal dilatation. Pancreas: Within normal limits. Spleen: Within normal limits. Adrenals/Urinary Tract: Adrenal glands are within normal limits. Kidneys are notable for a 3.0 cm left lower pole renal cyst (series 3/image 76). 13 mm interpolar right renal cyst (series 3/image 62). No renal calculi or hydronephrosis. Thick-walled bladder, although underdistended. Stomach/Bowel: Stomach is within normal limits. No evidence of bowel obstruction. Appendix is not discretely visualized. Mild sigmoid diverticulosis, without evidence of diverticulitis. Vascular/Lymphatic: No evidence of abdominal aortic aneurysm. Atherosclerotic calcifications of the abdominal aorta and branch vessels. No suspicious abdominopelvic lymphadenopathy. Reproductive: Status post hysterectomy. Bilateral  ovaries are within normal limits. Other: No abdominopelvic ascites. Musculoskeletal: Mild degenerative changes of the lumbar spine, most prominent at L5-S1. IMPRESSION: No evidence of acute cardiopulmonary disease. No evidence of obstruction. Appendix is not discretely visualized. Mild sigmoid diverticulosis, without evidence of diverticulitis. Additional ancillary findings as above. Aortic Atherosclerosis (ICD10-I70.0) and Emphysema (ICD10-J43.9). Pleural Electronically Signed   By: Julian Hy M.D.   On: 11/10/2020 02:50   CT Head Wo Contrast  Result Date: 11/09/2020 CLINICAL DATA:  Mental status change, hypertensive emergency EXAM: CT HEAD WITHOUT CONTRAST TECHNIQUE: Contiguous axial images were obtained from the base of the skull through the vertex without intravenous contrast. COMPARISON:  05/11/2020 FINDINGS: Brain: There is atrophy and chronic small vessel disease changes. No acute intracranial abnormality. Specifically, no hemorrhage, hydrocephalus, mass lesion, acute infarction, or significant intracranial injury. Vascular: No hyperdense vessel or unexpected calcification. Skull: No acute calvarial abnormality. Sinuses/Orbits: No acute findings Other: None IMPRESSION: Atrophy, chronic microvascular disease. No acute intracranial abnormality. Electronically Signed   By: Rolm Baptise M.D.   On: 11/09/2020 20:26   CT CHEST WO CONTRAST  Result Date: 11/10/2020 CLINICAL DATA:  Sepsis EXAM: CT CHEST, ABDOMEN AND PELVIS WITHOUT CONTRAST TECHNIQUE: Multidetector CT imaging of the chest, abdomen and pelvis was performed following the standard protocol without IV contrast. COMPARISON:  Chest radiograph dated 11/09/2020. CT pelvis dated 07/04/2020. CT abdomen/pelvis dated 09/05/2017. FINDINGS: CT CHEST FINDINGS Cardiovascular: Heart is normal in size.  No pericardial effusion. No evidence of thoracic aortic aneurysm. Atherosclerotic calcifications of the aortic arch. Right dialysis catheter terminates  in the inferior right atrium. Mediastinum/Nodes: No suspicious mediastinal lymphadenopathy. Visualized thyroid is unremarkable. Lungs/Pleura: Mild centrilobular and paraseptal emphysematous changes, upper lung predominant. Eventration of the right hemidiaphragm with overlying mild atelectasis. No focal consolidation. No suspicious pulmonary nodules. No pleural effusion or pneumothorax. Musculoskeletal: Mild degenerative changes of the lower thoracic spine. CT ABDOMEN PELVIS FINDINGS Hepatobiliary: Unenhanced liver is unremarkable. Gallbladder is unremarkable. No intrahepatic or extrahepatic ductal dilatation. Pancreas: Within normal limits. Spleen: Within normal limits. Adrenals/Urinary Tract: Adrenal glands are within normal limits. Kidneys are notable for a 3.0 cm left lower pole renal cyst (series 3/image 76). 13 mm interpolar right renal cyst (series 3/image 62). No renal calculi or hydronephrosis. Thick-walled bladder, although underdistended. Stomach/Bowel: Stomach is within normal limits. No evidence of bowel obstruction. Appendix is not discretely visualized. Mild sigmoid diverticulosis, without evidence of diverticulitis. Vascular/Lymphatic: No evidence of abdominal aortic aneurysm. Atherosclerotic calcifications of the abdominal aorta and branch vessels. No suspicious abdominopelvic lymphadenopathy. Reproductive: Status post hysterectomy. Bilateral ovaries are within normal limits. Other: No abdominopelvic ascites. Musculoskeletal: Mild degenerative changes of the lumbar spine, most prominent at L5-S1. IMPRESSION: No evidence of acute cardiopulmonary disease. No evidence of obstruction. Appendix is not discretely visualized. Mild sigmoid  diverticulosis, without evidence of diverticulitis. Additional ancillary findings as above. Aortic Atherosclerosis (ICD10-I70.0) and Emphysema (ICD10-J43.9). Pleural Electronically Signed   By: Julian Hy M.D.   On: 11/10/2020 02:50   CT Angio Chest Pulmonary  Embolism (PE) W or WO Contrast  Result Date: 11/10/2020 CLINICAL DATA:  PE suspected, high prob EXAM: CT ANGIOGRAPHY CHEST WITH CONTRAST TECHNIQUE: Multidetector CT imaging of the chest was performed using the standard protocol during bolus administration of intravenous contrast. Multiplanar CT image reconstructions and MIPs were obtained to evaluate the vascular anatomy. CONTRAST:  56m OMNIPAQUE IOHEXOL 350 MG/ML SOLN COMPARISON:  Same day chest CT. FINDINGS: Cardiovascular: Normal cardiac size.No pericardial disease.There is a pulmonary embolism within a left upper lobe segmental pulmonary artery (series 6, images 60-57). RV: LV ratio is 0.8.Moderate calcifications of the thoracic aorta. Right neck approach dialysis catheter terminates in near the inferior cavoatrial junction. Mediastinum/Nodes: No lymphadenopathy.The thyroid is unremarkable.Esophagus is unremarkable.The trachea is unremarkable. Lungs/Pleura: Central airways are clear. The trachea is unremarkable. Mild centrilobular and paraseptal emphysematous change. Right basilar atelectasis. There is no focal airspace consolidation.No suspicious pulmonary nodules or masses.No pleural effusion.No pneumothorax. Upper Abdomen: No acute abnormality. Musculoskeletal: No acute osseous abnormality.No suspicious lytic or blastic lesions. Multilevel degenerative changes of the spine. Review of the MIP images confirms the above findings. IMPRESSION: Acute pulmonary embolism within the left upper lobe segmental pulmonary artery. No evidence of right heart strain. Aortic Atherosclerosis (ICD10-I70.0) and Emphysema (ICD10-J43.9). Critical Value/emergent results were called by telephone at the time of interpretation on 11/10/2020 at 11:34 am [facility] who verbally acknowledged these results. Electronically Signed   By: JMaurine SimmeringM.D.   On: 11/10/2020 11:35   IR Fluoro Guide CV Line Left  Result Date: 11/11/2020 INDICATION: 75year old female  with history of end-stage renal disease on hemodialysis presenting with acute pulmonary embolism and poor venous access. EXAM: NON-TUNNELED CENTRAL VENOUS CATHETER PLACEMENT WITH ULTRASOUND AND FLUOROSCOPIC GUIDANCE COMPARISON:  CT chest from 11/10/2020 MEDICATIONS: None FLUOROSCOPY TIME:  0 minutes, 12 seconds (1 mGy) COMPLICATIONS: None immediate. PROCEDURE: Informed written consent was obtained from the patient's family after a discussion of the risks, benefits, and alternatives to treatment. Questions regarding the procedure were encouraged and answered. The left neck and chest were prepped with chlorhexidine in a sterile fashion, and a sterile drape was applied covering the operative field. Maximum barrier sterile technique with sterile gowns and gloves were used for the procedure. A timeout was performed prior to the initiation of the procedure. After the overlying soft tissues were anesthetized, a small venotomy incision was created and a micropuncture kit was utilized to access the internal jugular vein. Real-time ultrasound guidance was utilized for vascular access including the acquisition of a permanent ultrasound image documenting patency of the accessed vessel. And 0.035 inch guidewire advanced to the level of the IVC. Under fluoroscopic guidance, the venotomy was serially dilated, ultimately allowing placement of a triple-lumen central venous catheter catheter with tip ultimately terminating within the superior cavoatrial junction. Final catheter positioning was confirmed and documented with a spot radiographic image. The catheter aspirates and flushes normally. The catheter was flushed with appropriate volume heparin dwells. The catheter exit site was secured with a 0-Silk retention suture. A dressing was placed. The patient tolerated the procedure well without immediate post procedural complication. IMPRESSION: Successful placement of a left internal jugular approach triple-lumen central venous  catheter with tip terminating with in the superior superior cavoatrial junction. PLAN: The catheter is ready for immediate  use. Ruthann Cancer, MD [facility] Radiology Electronically Signed   By: Ruthann Cancer M.D.   On: 11/11/2020 08:21   IR US Guide Vasc Access Left  Result Date: 11/11/2020 INDICATION: 75 year old female with history of end-stage renal disease on hemodialysis presenting with acute pulmonary embolism and poor venous access. EXAM: NON-TUNNELED CENTRAL VENOUS CATHETER PLACEMENT WITH ULTRASOUND AND FLUOROSCOPIC GUIDANCE COMPARISON:  CT chest from 11/10/2020 MEDICATIONS: None FLUOROSCOPY TIME:  0 minutes, 12 seconds (1 mGy) COMPLICATIONS: None immediate. PROCEDURE: Informed written consent was obtained from the patient's family after a discussion of the risks, benefits, and alternatives to treatment. Questions regarding the procedure were encouraged and answered. The left neck and chest were prepped with chlorhexidine in a sterile fashion, and a sterile drape was applied covering the operative field. Maximum barrier sterile technique with sterile gowns and gloves were used for the procedure. A timeout was performed prior to the initiation of the procedure. After the overlying soft tissues were anesthetized, a small venotomy incision was created and a micropuncture kit was utilized to access the internal jugular vein. Real-time ultrasound guidance was utilized for vascular access including the acquisition of a permanent ultrasound image documenting patency of the accessed vessel. And 0.035 inch guidewire advanced to the level of the IVC. Under fluoroscopic guidance, the venotomy was serially dilated, ultimately allowing placement of a triple-lumen central venous catheter catheter with tip ultimately terminating within the superior cavoatrial junction. Final catheter positioning was confirmed and documented with a spot radiographic image. The catheter  aspirates and flushes normally. The catheter was flushed with appropriate volume heparin dwells. The catheter exit site was secured with a 0-Silk retention suture. A dressing was placed. The patient tolerated the procedure well without immediate post procedural complication. IMPRESSION: Successful placement of a left internal jugular approach triple-lumen central venous catheter with tip terminating with in the superior superior cavoatrial junction. PLAN: The catheter is ready for immediate use. Ruthann Cancer, MD [facility] Radiology Electronically Signed   By: Ruthann Cancer M.D.   On: 11/11/2020 08:21   DG CHEST PORT 1 VIEW  Result Date: 11/09/2020 CLINICAL DATA:  Syncope, seizure EXAM: PORTABLE CHEST 1 VIEW COMPARISON:  09/23/2020 FINDINGS: Right dialysis catheter remains in stable position with the tip in the lower right atrium. Heart is normal size. Lungs clear. No effusions or pneumothorax. No acute bony abnormality. IMPRESSION: No active disease. Electronically Signed   By: Rolm Baptise M.D.   On: 11/09/2020 18:10   VAS [place] LOWER EXTREMITY VENOUS (DVT)  Result Date: 11/10/2020  Lower Venous DVT Study Patient Name:  Joann Gutierrez  Date of Exam:   11/10/2020 Medical Rec #: [id number]            Accession #:    [id number] Date of Birth: [birth year]            Patient Gender: F Patient Age:   10 years Exam Location:  [facility] Procedure:      VAS [place] LOWER EXTREMITY VENOUS (DVT) Referring Phys: Jerald Kief Amara Manalang --------------------------------------------------------------------------------  Indications: Positve d dimer.  Limitations: Patient positioning and body habitus. Comparison Study: 06/20/20 prior Performing Technologist: Archie Patten RVS  Examination Guidelines: A complete evaluation includes B-mode imaging, spectral Doppler, color Doppler, and power Doppler as needed of all accessible portions of each vessel. Bilateral testing is  considered an integral part of a complete examination. Limited examinations for reoccurring indications may be performed as noted. The reflux portion  of the exam is performed with the patient in reverse Trendelenburg.  +--------+---------------+---------+-----------+----------+--------------------+ RIGHT   CompressibilityPhasicitySpontaneityPropertiesThrombus Aging       +--------+---------------+---------+-----------+----------+--------------------+ CFV     None           No       No                   Age Indeterminate    +--------+---------------+---------+-----------+----------+--------------------+ SFJ     Full                                                              +--------+---------------+---------+-----------+----------+--------------------+ FV Prox None                                         Age Indeterminate    +--------+---------------+---------+-----------+----------+--------------------+ FV Mid  None                                         Age Indeterminate    +--------+---------------+---------+-----------+----------+--------------------+ FV      Partial        Yes      Yes                  Age Indeterminate    Distal                                                                    +--------+---------------+---------+-----------+----------+--------------------+ PFV     None                                         Age Indeterminate    +--------+---------------+---------+-----------+----------+--------------------+ POP     Partial        Yes      Yes                  Age Indeterminate    +--------+---------------+---------+-----------+----------+--------------------+ PTV     Partial                                      Age Indeterminate    +--------+---------------+---------+-----------+----------+--------------------+ PERO    Partial                                      Age Indeterminate     +--------+---------------+---------+-----------+----------+--------------------+ EIV                    Yes      Yes                  patent by color  doppler              +--------+---------------+---------+-----------+----------+--------------------+   +--------+---------------+---------+-----------+----------+--------------------+ LEFT    CompressibilityPhasicitySpontaneityPropertiesThrombus Aging       +--------+---------------+---------+-----------+----------+--------------------+ CFV     None           Yes      Yes                  Age Indeterminate    +--------+---------------+---------+-----------+----------+--------------------+ SFJ     Full                                                              +--------+---------------+---------+-----------+----------+--------------------+ FV Prox Full                                                              +--------+---------------+---------+-----------+----------+--------------------+ FV Mid  Full                                                              +--------+---------------+---------+-----------+----------+--------------------+ FV      Full                                                              Distal                                                                    +--------+---------------+---------+-----------+----------+--------------------+ PFV     Full                                                              +--------+---------------+---------+-----------+----------+--------------------+ POP     Full           Yes      Yes                                       +--------+---------------+---------+-----------+----------+--------------------+ PTV     Full                                                              +--------+---------------+---------+-----------+----------+--------------------+  PERO    Full                                                              +--------+---------------+---------+-----------+----------+--------------------+ EIV                    Yes      Yes                  patent by color                                                           doppler              +--------+---------------+---------+-----------+----------+--------------------+     Summary: RIGHT: - 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 - No cystic structure found in the popliteal fossa.  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 - No cystic structure found in the popliteal fossa.  *See table(s) above for measurements and observations. Electronically signed by Deitra Mayo MD on 11/10/2020 at 3:12:48 PM.    Final         Scheduled Meds:  sodium chloride   Intravenous Once   Chlorhexidine Gluconate Cloth  6 each Topical Q0600   mouth rinse  15 mL Mouth Rinse BID   thiamine injection  100 mg Intravenous Daily   Continuous Infusions:  ceFEPime (MAXIPIME) IV 1 g (11/10/20 2115)   dextrose 50 mL/hr at 11/11/20 0823   heparin 650 Units/hr (11/11/20 1111)   metronidazole 500 mg (11/11/20 1309)   [START ON 11/14/2020] vancomycin       LOS: 2 days    Time spent: 35 minutes.     [name], MD [facility]   If 7PM-7AM, please contact night-coverage [email or web address]  11/11/2020, 1:31 PM

## 2020-11-11 NOTE — Progress Notes (Signed)
[facility] Progress Note   Subjective:    Seen and examined during HD. So far tolerating UFG. Appears comfortable and not in distress. Plan for HD 10/25.  Objective Vitals:   11/11/20 1205 11/11/20 1220 11/11/20 1230 11/11/20 1303  BP: 106/68 121/65 (!) 135/48 132/67  Pulse: 95 85 83 89  Resp: '15 18 17 18  ' Temp: 97.6 F (36.4 C) 98 F (36.7 C)  97.6 F (36.4 C)  TempSrc: Axillary Axillary  Oral  SpO2: 100% 100% 100%   Weight:  98.5 kg    Height:       Physical Exam General: Elderly woman; NAD Heart: Normal S1 and S2; No murmurs, gallops, or rubs Lungs: Clear throughout; No wheezing, rales, or rhonchi Abdomen: Soft and non-tender Extremities: No edema BLLE Dialysis Access: [facility]   Filed Weights   11/10/20 1200 11/11/20 0854 11/11/20 1220  Weight: 99 kg 98.6 kg 98.5 kg    Intake/Output Summary (Last 24 hours) at 11/11/2020 1705 Last data filed at 11/11/2020 1220 Gross per 24 hour  Intake --  Output 0 ml  Net 0 ml    Additional Objective Labs: Basic Metabolic Panel: Recent Labs  Lab 11/09/20 1728 11/10/20 0227 11/10/20 0916  NA 138 140 139  K 3.9 3.8 3.8  CL 101  --  104  CO2 19*  --  25  GLUCOSE 172*  --  96  BUN 8  --  14  CREATININE 3.03*  --  3.84*  CALCIUM 7.6*  --  8.1*   Liver Function Tests: Recent Labs  Lab 11/09/20 1728  AST 26  ALT 12  ALKPHOS 94  BILITOT 1.0  PROT 5.6*  ALBUMIN 2.3*   No results for input(s): LIPASE, AMYLASE in the last 168 hours. CBC: Recent Labs  Lab 11/09/20 1728 11/10/20 0227 11/10/20 0249 11/11/20 0500  WBC 13.0*  --  12.5* 6.2  NEUTROABS 11.2*  --   --   --   HGB 9.9* 11.6* 9.0* 7.0*  HCT 31.1* 34.0* 28.4* 21.4*  MCV 74.0*  --  74.5* 72.8*  PLT 183  --  146* 107*   Blood Culture    Component Value Date/Time   SDES BLOOD LEFT ANTECUBITAL 11/09/2020 1734   SPECREQUEST  11/09/2020 1734    BOTTLES DRAWN AEROBIC AND ANAEROBIC Blood Culture adequate volume   CULT  11/09/2020 1734     NO GROWTH 2 DAYS Performed at [facility] [address] [place], [place] [address]    REPTSTATUS PENDING 11/09/2020 1734    Cardiac Enzymes: No results for input(s): CKTOTAL, CKMB, CKMBINDEX, TROPONINI in the last 168 hours. CBG: Recent Labs  Lab 11/10/20 2052 11/11/20 0100 11/11/20 0439 11/11/20 0805 11/11/20 1247  GLUCAP 94 114* 119* 114* 82   Iron Studies: No results for input(s): IRON, TIBC, TRANSFERRIN, FERRITIN in the last 72 hours. Lab Results  Component Value Date   INR 1.7 (H) 11/10/2020   INR 1.2 02/15/2020   INR 1.04 09/13/2013   Studies/Results: CT ABDOMEN PELVIS WO CONTRAST  Result Date: 11/10/2020 CLINICAL DATA:  Sepsis EXAM: CT CHEST, ABDOMEN AND PELVIS WITHOUT CONTRAST TECHNIQUE: Multidetector CT imaging of the chest, abdomen and pelvis was performed following the standard protocol without IV contrast. COMPARISON:  Chest radiograph dated 11/09/2020. CT pelvis dated 07/04/2020. CT abdomen/pelvis dated 09/05/2017. FINDINGS: CT CHEST FINDINGS Cardiovascular: Heart is normal in size.  No pericardial effusion. No evidence of thoracic aortic aneurysm. Atherosclerotic calcifications of the aortic arch. Right dialysis catheter  terminates in the inferior right atrium. Mediastinum/Nodes: No suspicious mediastinal lymphadenopathy. Visualized thyroid is unremarkable. Lungs/Pleura: Mild centrilobular and paraseptal emphysematous changes, upper lung predominant. Eventration of the right hemidiaphragm with overlying mild atelectasis. No focal consolidation. No suspicious pulmonary nodules. No pleural effusion or pneumothorax. Musculoskeletal: Mild degenerative changes of the lower thoracic spine. CT ABDOMEN PELVIS FINDINGS Hepatobiliary: Unenhanced liver is unremarkable. Gallbladder is unremarkable. No intrahepatic or extrahepatic ductal dilatation. Pancreas: Within normal limits. Spleen: Within normal limits. Adrenals/Urinary Tract: Adrenal glands are within normal limits.  Kidneys are notable for a 3.0 cm left lower pole renal cyst (series 3/image 76). 13 mm interpolar right renal cyst (series 3/image 62). No renal calculi or hydronephrosis. Thick-walled bladder, although underdistended. Stomach/Bowel: Stomach is within normal limits. No evidence of bowel obstruction. Appendix is not discretely visualized. Mild sigmoid diverticulosis, without evidence of diverticulitis. Vascular/Lymphatic: No evidence of abdominal aortic aneurysm. Atherosclerotic calcifications of the abdominal aorta and branch vessels. No suspicious abdominopelvic lymphadenopathy. Reproductive: Status post hysterectomy. Bilateral ovaries are within normal limits. Other: No abdominopelvic ascites. Musculoskeletal: Mild degenerative changes of the lumbar spine, most prominent at L5-S1. IMPRESSION: No evidence of acute cardiopulmonary disease. No evidence of obstruction. Appendix is not discretely visualized. Mild sigmoid diverticulosis, without evidence of diverticulitis. Additional ancillary findings as above. Aortic Atherosclerosis (ICD10-I70.0) and Emphysema (ICD10-J43.9). Pleural Electronically Signed   By: Julian Hy M.D.   On: 11/10/2020 02:50   CT Head Wo Contrast  Result Date: 11/09/2020 CLINICAL DATA:  Mental status change, hypertensive emergency EXAM: CT HEAD WITHOUT CONTRAST TECHNIQUE: Contiguous axial images were obtained from the base of the skull through the vertex without intravenous contrast. COMPARISON:  05/11/2020 FINDINGS: Brain: There is atrophy and chronic small vessel disease changes. No acute intracranial abnormality. Specifically, no hemorrhage, hydrocephalus, mass lesion, acute infarction, or significant intracranial injury. Vascular: No hyperdense vessel or unexpected calcification. Skull: No acute calvarial abnormality. Sinuses/Orbits: No acute findings Other: None IMPRESSION: Atrophy, chronic microvascular disease. No acute intracranial abnormality. Electronically Signed   By:  Rolm Baptise M.D.   On: 11/09/2020 20:26   CT CHEST WO CONTRAST  Result Date: 11/10/2020 CLINICAL DATA:  Sepsis EXAM: CT CHEST, ABDOMEN AND PELVIS WITHOUT CONTRAST TECHNIQUE: Multidetector CT imaging of the chest, abdomen and pelvis was performed following the standard protocol without IV contrast. COMPARISON:  Chest radiograph dated 11/09/2020. CT pelvis dated 07/04/2020. CT abdomen/pelvis dated 09/05/2017. FINDINGS: CT CHEST FINDINGS Cardiovascular: Heart is normal in size.  No pericardial effusion. No evidence of thoracic aortic aneurysm. Atherosclerotic calcifications of the aortic arch. Right dialysis catheter terminates in the inferior right atrium. Mediastinum/Nodes: No suspicious mediastinal lymphadenopathy. Visualized thyroid is unremarkable. Lungs/Pleura: Mild centrilobular and paraseptal emphysematous changes, upper lung predominant. Eventration of the right hemidiaphragm with overlying mild atelectasis. No focal consolidation. No suspicious pulmonary nodules. No pleural effusion or pneumothorax. Musculoskeletal: Mild degenerative changes of the lower thoracic spine. CT ABDOMEN PELVIS FINDINGS Hepatobiliary: Unenhanced liver is unremarkable. Gallbladder is unremarkable. No intrahepatic or extrahepatic ductal dilatation. Pancreas: Within normal limits. Spleen: Within normal limits. Adrenals/Urinary Tract: Adrenal glands are within normal limits. Kidneys are notable for a 3.0 cm left lower pole renal cyst (series 3/image 76). 13 mm interpolar right renal cyst (series 3/image 62). No renal calculi or hydronephrosis. Thick-walled bladder, although underdistended. Stomach/Bowel: Stomach is within normal limits. No evidence of bowel obstruction. Appendix is not discretely visualized. Mild sigmoid diverticulosis, without evidence of diverticulitis. Vascular/Lymphatic: No evidence of abdominal aortic aneurysm. Atherosclerotic calcifications of the abdominal aorta and branch vessels. No  suspicious  abdominopelvic lymphadenopathy. Reproductive: Status post hysterectomy. Bilateral ovaries are within normal limits. Other: No abdominopelvic ascites. Musculoskeletal: Mild degenerative changes of the lumbar spine, most prominent at L5-S1. IMPRESSION: No evidence of acute cardiopulmonary disease. No evidence of obstruction. Appendix is not discretely visualized. Mild sigmoid diverticulosis, without evidence of diverticulitis. Additional ancillary findings as above. Aortic Atherosclerosis (ICD10-I70.0) and Emphysema (ICD10-J43.9). Pleural Electronically Signed   By: Julian Hy M.D.   On: 11/10/2020 02:50   CT Angio Chest Pulmonary Embolism (PE) W or WO Contrast  Result Date: 11/10/2020 CLINICAL DATA:  PE suspected, high prob EXAM: CT ANGIOGRAPHY CHEST WITH CONTRAST TECHNIQUE: Multidetector CT imaging of the chest was performed using the standard protocol during bolus administration of intravenous contrast. Multiplanar CT image reconstructions and MIPs were obtained to evaluate the vascular anatomy. CONTRAST:  16m OMNIPAQUE IOHEXOL 350 MG/ML SOLN COMPARISON:  Same day chest CT. FINDINGS: Cardiovascular: Normal cardiac size.No pericardial disease.There is a pulmonary embolism within a left upper lobe segmental pulmonary artery (series 6, images 60-57). RV: LV ratio is 0.8.Moderate calcifications of the thoracic aorta. Right neck approach dialysis catheter terminates in near the inferior cavoatrial junction. Mediastinum/Nodes: No lymphadenopathy.The thyroid is unremarkable.Esophagus is unremarkable.The trachea is unremarkable. Lungs/Pleura: Central airways are clear. The trachea is unremarkable. Mild centrilobular and paraseptal emphysematous change. Right basilar atelectasis. There is no focal airspace consolidation.No suspicious pulmonary nodules or masses.No pleural effusion.No pneumothorax. Upper Abdomen: No acute abnormality. Musculoskeletal: No acute osseous abnormality.No suspicious lytic or blastic  lesions. Multilevel degenerative changes of the spine. Review of the MIP images confirms the above findings. IMPRESSION: Acute pulmonary embolism within the left upper lobe segmental pulmonary artery. No evidence of right heart strain. Aortic Atherosclerosis (ICD10-I70.0) and Emphysema (ICD10-J43.9). Critical Value/emergent results were called by telephone at the time of interpretation on 11/10/2020 at 11:34 am [facility] verbally acknowledged these results. Electronically Signed   By: JMaurine SimmeringM.D.   On: 11/10/2020 11:35   IR Fluoro Guide CV Line Left  Result Date: 11/11/2020 INDICATION: 75year old female with history of end-stage renal disease on hemodialysis presenting with acute pulmonary embolism and poor venous access. EXAM: NON-TUNNELED CENTRAL VENOUS CATHETER PLACEMENT WITH ULTRASOUND AND FLUOROSCOPIC GUIDANCE COMPARISON:  CT chest from 11/10/2020 MEDICATIONS: None FLUOROSCOPY TIME:  0 minutes, 12 seconds (1 mGy) COMPLICATIONS: None immediate. PROCEDURE: Informed written consent was obtained from the patient's family after a discussion of the risks, benefits, and alternatives to treatment. Questions regarding the procedure were encouraged and answered. The left neck and chest were prepped with chlorhexidine in a sterile fashion, and a sterile drape was applied covering the operative field. Maximum barrier sterile technique with sterile gowns and gloves were used for the procedure. A timeout was performed prior to the initiation of the procedure. After the overlying soft tissues were anesthetized, a small venotomy incision was created and a micropuncture kit was utilized to access the internal jugular vein. Real-time ultrasound guidance was utilized for vascular access including the acquisition of a permanent ultrasound image documenting patency of the accessed vessel. And 0.035 inch guidewire advanced to the level of the IVC. Under fluoroscopic guidance, the venotomy was  serially dilated, ultimately allowing placement of a triple-lumen central venous catheter catheter with tip ultimately terminating within the superior cavoatrial junction. Final catheter positioning was confirmed and documented with a spot radiographic image. The catheter aspirates and flushes normally. The catheter was flushed with appropriate volume heparin dwells. The catheter exit site was secured with a 0-Silk  retention suture. A dressing was placed. The patient tolerated the procedure well without immediate post procedural complication. IMPRESSION: Successful placement of a left internal jugular approach triple-lumen central venous catheter with tip terminating with in the superior superior cavoatrial junction. PLAN: The catheter is ready for immediate use. Ruthann Cancer, MD [facility] Electronically Signed   By: Ruthann Cancer M.D.   On: 11/11/2020 08:21   IR US Guide Vasc Access Left  Result Date: 11/11/2020 INDICATION: 75 year old female with history of end-stage renal disease on hemodialysis presenting with acute pulmonary embolism and poor venous access. EXAM: NON-TUNNELED CENTRAL VENOUS CATHETER PLACEMENT WITH ULTRASOUND AND FLUOROSCOPIC GUIDANCE COMPARISON:  CT chest from 11/10/2020 MEDICATIONS: None FLUOROSCOPY TIME:  0 minutes, 12 seconds (1 mGy) COMPLICATIONS: None immediate. PROCEDURE: Informed written consent was obtained from the patient's family after a discussion of the risks, benefits, and alternatives to treatment. Questions regarding the procedure were encouraged and answered. The left neck and chest were prepped with chlorhexidine in a sterile fashion, and a sterile drape was applied covering the operative field. Maximum barrier sterile technique with sterile gowns and gloves were used for the procedure. A timeout was performed prior to the initiation of the procedure. After the overlying soft tissues were anesthetized, a small  venotomy incision was created and a micropuncture kit was utilized to access the internal jugular vein. Real-time ultrasound guidance was utilized for vascular access including the acquisition of a permanent ultrasound image documenting patency of the accessed vessel. And 0.035 inch guidewire advanced to the level of the IVC. Under fluoroscopic guidance, the venotomy was serially dilated, ultimately allowing placement of a triple-lumen central venous catheter catheter with tip ultimately terminating within the superior cavoatrial junction. Final catheter positioning was confirmed and documented with a spot radiographic image. The catheter aspirates and flushes normally. The catheter was flushed with appropriate volume heparin dwells. The catheter exit site was secured with a 0-Silk retention suture. A dressing was placed. The patient tolerated the procedure well without immediate post procedural complication. IMPRESSION: Successful placement of a left internal jugular approach triple-lumen central venous catheter with tip terminating with in the superior superior cavoatrial junction. PLAN: The catheter is ready for immediate use. Ruthann Cancer, MD [facility] Electronically Signed   By: Ruthann Cancer M.D.   On: 11/11/2020 08:21   DG CHEST PORT 1 VIEW  Result Date: 11/09/2020 CLINICAL DATA:  Syncope, seizure EXAM: PORTABLE CHEST 1 VIEW COMPARISON:  09/23/2020 FINDINGS: Right dialysis catheter remains in stable position with the tip in the lower right atrium. Heart is normal size. Lungs clear. No effusions or pneumothorax. No acute bony abnormality. IMPRESSION: No active disease. Electronically Signed   By: Rolm Baptise M.D.   On: 11/09/2020 18:10   ECHOCARDIOGRAM COMPLETE  Result Date: 11/11/2020    ECHOCARDIOGRAM REPORT   Patient Name:   Joann Gutierrez Date of Exam: 11/11/2020 Medical Rec #:  [id number]           Height:       64.0 in Accession  #:    [id number]          Weight:       218.3 lb Date of Birth:  1945-10-14           BSA:          2.030 m Patient Age:    32 years            BP:  132/67 mmHg Patient Gender: F                   HR:           54 bpm. Exam Location:  Inpatient Procedure: 2D Echo, 3D Echo, Cardiac Doppler and Color Doppler Indications:    R55 Syncope  History:        Patient has prior history of Echocardiogram examinations, most                 recent 03/31/2020. COPD, Signs/Symptoms:Bacteremia, Alzheimer's,                 Altered Mental Status and Chest Pain; Risk Factors:Hypertension,                 Diabetes and Dyslipidemia. ESRD.  Sonographer:    Roseanna Rainbow RDCS Referring Phys: [id number] [facility]  Sonographer Comments: Technically difficult study due to poor echo windows. Patient in mits and in slight right decubitus position. Patient could not follow directions. Patient making repetitive movements throughout exam that corrupted EKG signal. IMPRESSIONS  1. Peak LVOT velocity - 2.2 m/s, peak gradient 19 mmHg - no obstruction. Left ventricular ejection fraction, by estimation, is >75%. The left ventricle has hyperdynamic function. The left ventricle has no regional wall motion abnormalities. There is mild left ventricular hypertrophy. Left ventricular diastolic parameters are consistent with Grade I diastolic dysfunction (impaired relaxation).  2. Right ventricular systolic function is normal. The right ventricular size is normal. There is moderately elevated pulmonary artery systolic pressure.  3. The mitral valve is normal in structure. No evidence of mitral valve regurgitation. No evidence of mitral stenosis.  4. The aortic valve is normal in structure. Aortic valve regurgitation is not visualized. No aortic stenosis is present.  5. The inferior vena cava is normal in size with greater than 50% respiratory variability, suggesting right atrial pressure of 3 mmHg. Comparison(s): No significant change from prior  study. Prior images reviewed side by side. FINDINGS  Left Ventricle: Peak LVOT velocity - 2.2 m/s, peak gradient 19 mmHg - no obstruction. Left ventricular ejection fraction, by estimation, is >75%. The left ventricle has hyperdynamic function. The left ventricle has no regional wall motion abnormalities.  3D left ventricular ejection fraction analysis performed but not reported based on interpreter judgement due to suboptimal quality. The left ventricular internal cavity size was normal in size. There is mild left ventricular hypertrophy. Left ventricular diastolic parameters are consistent with Grade I diastolic dysfunction (impaired relaxation). Right Ventricle: The right ventricular size is normal. No increase in right ventricular wall thickness. Right ventricular systolic function is normal. There is moderately elevated pulmonary artery systolic pressure. The tricuspid regurgitant velocity is 3.11 m/s, and with an assumed right atrial pressure of 15 mmHg, the estimated right ventricular systolic pressure is 84.1 mmHg. Left Atrium: Left atrial size was normal in size. Right Atrium: Right atrial size was normal in size. Pericardium: There is no evidence of pericardial effusion. Mitral Valve: The mitral valve is normal in structure. No evidence of mitral valve regurgitation. No evidence of mitral valve stenosis. Tricuspid Valve: The tricuspid valve is normal in structure. Tricuspid valve regurgitation is not demonstrated. No evidence of tricuspid stenosis. Aortic Valve: The aortic valve is normal in structure. Aortic valve regurgitation is not visualized. No aortic stenosis is present. Aortic valve peak gradient measures 3.2 mmHg. Pulmonic Valve: The pulmonic valve was normal in structure. Pulmonic valve regurgitation is not visualized. No evidence of pulmonic stenosis.  Aorta: The aortic root is normal in size and structure. Venous: The inferior vena cava is normal in size with greater than 50% respiratory  variability, suggesting right atrial pressure of 3 mmHg. IAS/Shunts: No atrial level shunt detected by color flow Doppler.  LEFT VENTRICLE PLAX 2D LVIDd:         2.65 cm     Diastology LVIDs:         1.60 cm     LV e' medial:    5.11 cm/s LV PW:         1.40 cm     LV E/e' medial:  10.2 LV IVS:        1.40 cm     LV e' lateral:   6.09 cm/s LVOT diam:     1.70 cm     LV E/e' lateral: 8.5 LV SV:         38 LV SV Index:   19 LVOT Area:     2.27 cm                             3D Volume EF: LV Volumes (MOD)           3D EF:        70 % LV vol d, MOD A2C: 29.5 ml LV EDV:       54 ml LV vol d, MOD A4C: 28.3 ml LV ESV:       16 ml LV vol s, MOD A2C: 6.7 ml  LV SV:        38 ml LV vol s, MOD A4C: 6.8 ml LV SV MOD A2C:     22.8 ml LV SV MOD A4C:     28.3 ml LV SV MOD BP:      22.7 ml RIGHT VENTRICLE             IVC RV S prime:     14.30 cm/s  IVC diam: 1.90 cm TAPSE (M-mode): 1.9 cm LEFT ATRIUM             Index       RIGHT ATRIUM          Index LA diam:        3.10 cm 1.53 cm/m  RA Area:     5.02 cm LA Vol (A2C):   8.4 ml  4.16 ml/m  RA Volume:   6.16 ml  3.03 ml/m LA Vol (A4C):   18.4 ml 9.06 ml/m LA Biplane Vol: 13.8 ml 6.80 ml/m  AORTIC VALVE AV Area (Vmax): 2.15 cm AV Vmax:        89.90 cm/s AV Peak Grad:   3.2 mmHg LVOT Vmax:      85.00 cm/s LVOT Vmean:     55.600 cm/s LVOT VTI:       0.166 m  AORTA Ao Root diam: 3.25 cm Ao Asc diam:  2.60 cm MITRAL VALVE               TRICUSPID VALVE MV Area (PHT): 2.60 cm    TR Peak grad:   38.7 mmHg MV Decel Time: 292 msec    TR Vmax:        311.00 cm/s MV E velocity: 52.00 cm/s MV A velocity: 85.50 cm/s  SHUNTS MV E/A ratio:  0.61        Systemic VTI:  0.17 m  Systemic Diam: 1.70 cm Candee Furbish MD Electronically signed by Candee Furbish MD Signature Date/Time: 11/11/2020/2:57:12 PM    Final    VAS [place] LOWER EXTREMITY VENOUS (DVT)  Result Date: 11/10/2020  Lower Venous DVT Study Patient Name:  Joann Gutierrez  Date of Exam:   11/10/2020 Medical  Rec #: [id number]            Accession #:    [id number] Date of Birth: [birth year]            Patient Gender: F Patient Age:   75 years Exam Location:  [facility] Procedure:      VAS [place] LOWER EXTREMITY VENOUS (DVT) Referring Phys: Jerald Kief REGALADO --------------------------------------------------------------------------------  Indications: Positve d dimer.  Limitations: Patient positioning and body habitus. Comparison Study: 06/20/20 prior Performing Technologist: Archie Patten RVS  Examination Guidelines: A complete evaluation includes B-mode imaging, spectral Doppler, color Doppler, and power Doppler as needed of all accessible portions of each vessel. Bilateral testing is considered an integral part of a complete examination. Limited examinations for reoccurring indications may be performed as noted. The reflux portion of the exam is performed with the patient in reverse Trendelenburg.  +--------+---------------+---------+-----------+----------+--------------------+ RIGHT   CompressibilityPhasicitySpontaneityPropertiesThrombus Aging       +--------+---------------+---------+-----------+----------+--------------------+ CFV     None           No       No                   Age Indeterminate    +--------+---------------+---------+-----------+----------+--------------------+ SFJ     Full                                                              +--------+---------------+---------+-----------+----------+--------------------+ FV Prox None                                         Age Indeterminate    +--------+---------------+---------+-----------+----------+--------------------+ FV Mid  None                                         Age Indeterminate    +--------+---------------+---------+-----------+----------+--------------------+ FV      Partial        Yes      Yes                  Age Indeterminate    Distal                                                                     +--------+---------------+---------+-----------+----------+--------------------+ PFV     None                                         Age Indeterminate    +--------+---------------+---------+-----------+----------+--------------------+ POP  Partial        Yes      Yes                  Age Indeterminate    +--------+---------------+---------+-----------+----------+--------------------+ PTV     Partial                                      Age Indeterminate    +--------+---------------+---------+-----------+----------+--------------------+ PERO    Partial                                      Age Indeterminate    +--------+---------------+---------+-----------+----------+--------------------+ EIV                    Yes      Yes                  patent by color                                                           doppler              +--------+---------------+---------+-----------+----------+--------------------+   +--------+---------------+---------+-----------+----------+--------------------+ LEFT    CompressibilityPhasicitySpontaneityPropertiesThrombus Aging       +--------+---------------+---------+-----------+----------+--------------------+ CFV     None           Yes      Yes                  Age Indeterminate    +--------+---------------+---------+-----------+----------+--------------------+ SFJ     Full                                                              +--------+---------------+---------+-----------+----------+--------------------+ FV Prox Full                                                              +--------+---------------+---------+-----------+----------+--------------------+ FV Mid  Full                                                              +--------+---------------+---------+-----------+----------+--------------------+ FV      Full                                                               Distal                                                                    +--------+---------------+---------+-----------+----------+--------------------+  PFV     Full                                                              +--------+---------------+---------+-----------+----------+--------------------+ POP     Full           Yes      Yes                                       +--------+---------------+---------+-----------+----------+--------------------+ PTV     Full                                                              +--------+---------------+---------+-----------+----------+--------------------+ PERO    Full                                                              +--------+---------------+---------+-----------+----------+--------------------+ EIV                    Yes      Yes                  patent by color                                                           doppler              +--------+---------------+---------+-----------+----------+--------------------+     Summary: RIGHT: - 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 - No cystic structure found in the popliteal fossa.  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 - No cystic structure found in the popliteal fossa.  *See table(s) above for measurements and observations. Electronically signed by Deitra Mayo MD on 11/10/2020 at 3:12:48 PM.    Final     Medications:  ceFEPime (MAXIPIME) IV 1 g (11/10/20 2115)   dextrose 50 mL/hr at 11/11/20 0823   heparin 650 Units/hr (11/11/20 1111)    sodium chloride   Intravenous Once   Chlorhexidine Gluconate Cloth  6 each Topical Q0600   mouth rinse  15 mL Mouth Rinse  BID   thiamine injection  100 mg Intravenous Daily    Dialysis Orders: Center: [facility]  on TTS . 180NRe, 4 hours, BFR 400, DFR 500, EDW 77kg 4K, 2.25Ca, TDC Heparin 6000 unit bolus Mircera 274mg IV q 2 weeks- last dose 10/31/20 Hectorol 342m IV q HD  Assessment/Plan: Syncope: Hypotensive on arrival despite no UF with  HD, given 1.5L IVF total. CTA now showing PE. LE US performed (+) DVT left common femoral vein. Continue Heparin Gtt. ECHO completed today. PE: heparin ordered, management per primary team  ESRD:  TTS schedule On HD today, so far tolerating UF. Plan for HD 10/25 per her usual schedule.  Hypertension/volume: Hypotensive on arrival, given 1.5L UF and now appears close to euvolemic, no edema on CT. Bps appears more improved. Monitor trends.  Minimal UF with HD today.  Anemia: Hgb dropping: 11.6-9.0-now 7.0. Monitor trend, not due for ESA yet. Give blood transfusion as needed.  Metabolic bone disease: Calcium controlled, no phos reported yet. Resume binders once eating.   Tobie Poet, NP [facility] 11/11/2020,5:05 PM  LOS: 2 days

## 2020-11-11 NOTE — Progress Notes (Signed)
Pharmacy Antibiotic Note  Joann Gutierrez is a 75 y.o. female admitted on 11/09/2020 with bacteremia.  Pharmacy has been consulted for cefepime and vancomycin dosing. Patient is ESRD. Patient presented to ED d/t witnessed syncopal/seizure activity following HD session on 11/09/2020.  On 10/20, patient received a loading dose of Vancomycin 2000mg  IV x 1 and was started on Cefepime 1g IV q24h. On 10/21, patient was started on Metronidazole 500mg  IV q12h.   Per nephrology, plans for TTS HD schedule. Pharmacy has been consulted for Cefepime and Vancomycin dosing.   Plan: Cefepime 1g IV q24h Metronidazole 500mg  IV q12h Vancomycin 1000mg  IV qHD (TTS) Monitor clinical status, cultures, and de-escalate antibiotics as able  Height: 5\' 4"  (162.6 cm) Weight: 99 kg (218 lb 4.1 oz) IBW/kg (Calculated) : 54.7  Temp (24hrs), Avg:97.7 F (36.5 C), Min:97.3 F (36.3 C), Max:98.2 F (36.8 C)  Recent Labs  Lab 11/09/20 1728 11/09/20 1729 11/09/20 1955 11/10/20 0249 11/10/20 0916 11/11/20 0500  WBC 13.0*  --   --  12.5*  --  6.2  CREATININE 3.03*  --   --   --  3.84*  --   LATICACIDVEN  --  8.4* 6.9* 1.4  --   --      Estimated Creatinine Clearance: 14.5 mL/min (A) (by C-G formula based on SCr of 3.84 mg/dL (H)).    Allergies  Allergen Reactions   Morphine And Related Other (See Comments)    Family request not to be given, reports pt does not wake up when given    Promethazine Hcl Other (See Comments)    REACTION: lethargy    Antimicrobials this admission: Cefepime 10/20 >>  Vancomycin 10/20 >> Metronidazole 10/21 >>   Microbiology results: 10/20 BCx: ngtd 10/20 Resp panel: negative   Thank you for allowing pharmacy to be a part of this patient's care.  Vance Peper, PharmD PGY1 Pharmacy Resident Phone [phone] 11/11/2020 8:42 AM   Please check AMION for all [place] phone numbers After 10:00 PM, call [place] [phone]

## 2020-11-11 NOTE — Progress Notes (Addendum)
ANTICOAGULATION CONSULT NOTE - Follow-up Consult  Pharmacy Consult for heparin Indication: pulmonary embolus  Allergies  Allergen Reactions   Morphine And Related Other (See Comments)    Family request not to be given, reports pt does not wake up when given    Promethazine Hcl Other (See Comments)    REACTION: lethargy    Patient Measurements: Height: 5\' 4"  (162.6 cm) Weight: 98.5 kg (217 lb 2.5 oz) IBW/kg (Calculated) : 54.7 Heparin Dosing Weight: 77.6 kg  Vital Signs: Temp: 97.6 F (36.4 C) (10/22 1303) Temp Source: Oral (10/22 1303) BP: 132/67 (10/22 1303) Pulse Rate: 89 (10/22 1303)  Labs: Recent Labs    11/09/20 1728 11/09/20 1929 11/10/20 0227 11/10/20 0249 11/10/20 0916 11/10/20 1912 11/10/20 2047 11/11/20 0500 11/11/20 0630  HGB 9.9*  --  11.6* 9.0*  --   --   --  7.0*  --   HCT 31.1*  --  34.0* 28.4*  --   --   --  21.4*  --   PLT 183  --   --  146*  --   --   --  107*  --   APTT  --   --   --   --   --  >200* >200*  --   --   LABPROT  --   --   --   --   --  19.7*  --   --   --   INR  --   --   --   --   --  1.7*  --   --   --   HEPARINUNFRC  --   --   --   --   --  >1.10* >1.10*  --  1.03*  CREATININE 3.03*  --   --   --  3.84*  --   --   --   --   TROPONINIHS 27* 25*  --   --   --   --   --   --   --     Estimated Creatinine Clearance: 14.4 mL/min (A) (by C-G formula based on SCr of 3.84 mg/dL (H)).   Medical History: Past Medical History:  Diagnosis Date   Adenomatous colon polyp    Allergy    Anemia    Asthma        CAD (coronary artery disease)    Mild very minimal coronary disease with 20% obtuse marginal stenosis   Carpal tunnel syndrome on left    Cataract    CHF (congestive heart failure) (HCC)    Chronic kidney disease (CKD), stage III (moderate) (HCC)    now stage 4   COPD (chronic obstructive pulmonary disease) ([place])    Dementia with behavioral disturbance 03/09/2020   Depression    Diabetes mellitus 1997   Type II     Diverticulosis    Dyspnea    Elevated diaphragm November 2011   Right side   Esophageal dysmotility    Esophageal stricture    ESRD on hemodialysis ([place]) 03/09/2020   Gastritis    Gastroparesis 08/21/2007   GERD (gastroesophageal reflux disease)    Hearing loss of both ears    Hernia, hiatal    Hyperlipidemia    Hypertension    Morbid obesity ([place])    OSTEOARTHRITIS 08/09/2006   Osteoporosis    Oxygen deficiency    prn    PERIPHERAL NEUROPATHY, FEET 09/23/2007   RENAL INSUFFICIENCY 02/16/2009   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   On 10/21, heparin level > 1.1 x 2 (on 1250 units/hr, hold nurse redraw to confirm). APT > 200. CBC wnl - PLTc 146 - runs mid 100s-200s usually. No s/sx of bleeding. No AC PTA. Heparin gtt was held x 2 hours, then resumed at 950 units/hr.  Heparin level supratherapeutic at 1.03 this morning, at 950 units/hr. Hgb low 7.0, Hct 21.4 - per MD, plans to redraw CBC and will determine if transfusion is needed. Per RN, noted bleeding around central line d/t suture that came out. Per IR, likely superficial bleeding but will monitor and reassess as needed. Per nephrology, plans for HD today (TTS HD schedule).   Goal of Therapy:  Heparin level 0.3-0.7 units/ml Monitor platelets by anticoagulation protocol: Yes   Plan:  Hold heparin for 2 hours, and then resume heparin gtt at decreased rate of 650 units/hr. Check ~8hr heparin level.  Monitor daily heparin level, CBC, and for signs/symptoms of bleeding.   Addendum Per HD RN, noted continued bleeding around suture site during dialysis. Per IR (Dr. Serafina Royals) and MD (Dr. Tyrell Antonio) - likely superficial bleeding, will plan to continue heparin gtt and monitor.     Thank you for allowing pharmacy to be a part of this patient's care.  Vance Peper, PharmD PGY1 Pharmacy Resident Phone [phone] 11/11/2020 1:27 PM   Please check AMION for all [place] phone numbers After 10:00 PM, call [place] [phone]

## 2020-11-12 ENCOUNTER — Inpatient Hospital Stay (HOSPITAL_COMMUNITY): Payer: Medicare HMO

## 2020-11-12 DIAGNOSIS — R4182 Altered mental status, unspecified: Secondary | ICD-10-CM

## 2020-11-12 DIAGNOSIS — R55 Syncope and collapse: Secondary | ICD-10-CM | POA: Diagnosis not present

## 2020-11-12 DIAGNOSIS — L8990 Pressure ulcer of unspecified site, unspecified stage: Secondary | ICD-10-CM | POA: Insufficient documentation

## 2020-11-12 LAB — GLUCOSE, CAPILLARY
Glucose-Capillary: 139 mg/dL — ABNORMAL HIGH (ref 70–99)
Glucose-Capillary: 122 mg/dL — ABNORMAL HIGH (ref 70–99)
Glucose-Capillary: 103 mg/dL — ABNORMAL HIGH (ref 70–99)
Glucose-Capillary: 109 mg/dL — ABNORMAL HIGH (ref 70–99)
Glucose-Capillary: 120 mg/dL — ABNORMAL HIGH (ref 70–99)

## 2020-11-12 LAB — CBC
HCT: 24.7 % — ABNORMAL LOW (ref 36.0–46.0)
Hemoglobin: 8.3 g/dL — ABNORMAL LOW (ref 12.0–15.0)
MCH: 24.9 pg — ABNORMAL LOW (ref 26.0–34.0)
MCHC: 33.6 g/dL (ref 30.0–36.0)
MCV: 74 fL — ABNORMAL LOW (ref 80.0–100.0)
Platelets: 89 10*3/uL — ABNORMAL LOW (ref 150–400)
RBC: 3.34 MIL/uL — ABNORMAL LOW (ref 3.87–5.11)
RDW: 24.3 % — ABNORMAL HIGH (ref 11.5–15.5)
WBC: 7 10*3/uL (ref 4.0–10.5)
nRBC: 1.1 % — ABNORMAL HIGH (ref 0.0–0.2)

## 2020-11-12 LAB — HEPARIN LEVEL (UNFRACTIONATED): Heparin Unfractionated: 0.5 IU/mL (ref 0.30–0.70)

## 2020-11-12 LAB — TYPE AND SCREEN
ABO/RH(D): O POS
Antibody Screen: NEGATIVE
Unit division: 0

## 2020-11-12 LAB — BPAM RBC
Blood Product Expiration Date: 202211182359
ISSUE DATE / TIME: 202210221151
Unit Type and Rh: 5100

## 2020-11-12 LAB — HEPATITIS B SURFACE ANTIBODY, QUANTITATIVE: Hep B S AB Quant (Post): 50.5 m[IU]/mL

## 2020-11-12 MED ORDER — SODIUM CHLORIDE 0.9% FLUSH
10.0000 mL | INTRAVENOUS | Status: DC | PRN
  Administered 2020-11-21: 10 mL

## 2020-11-12 MED ORDER — SODIUM CHLORIDE 0.9 % IV SOLN: INTRAVENOUS | Status: DC | PRN

## 2020-11-12 MED ORDER — SODIUM CHLORIDE 0.9% FLUSH
10.0000 mL | Freq: Two times a day (BID) | INTRAVENOUS | Status: DC
  Administered 2020-11-12: 20 mL
  Administered 2020-11-13 – 2020-11-23 (×13): 10 mL

## 2020-11-12 MED ORDER — ALTEPLASE 2 MG IJ SOLR
2.0000 mg | Freq: Once | INTRAMUSCULAR | Status: AC
  Administered 2020-11-12: 2 mg
  Filled 2020-11-12: qty 2

## 2020-11-12 MED ORDER — SODIUM CHLORIDE 0.9 % IV SOLN
1.0000 g | INTRAVENOUS | Status: AC
  Administered 2020-11-12 – 2020-11-13 (×2): 1 g via INTRAVENOUS
  Filled 2020-11-12 (×2): qty 1

## 2020-11-12 MED ORDER — DARBEPOETIN ALFA 200 MCG/0.4ML IJ SOSY
200.0000 ug | PREFILLED_SYRINGE | INTRAMUSCULAR | Status: DC
  Administered 2020-11-14 – 2020-11-21 (×2): 200 ug via INTRAVENOUS
  Filled 2020-11-12 (×4): qty 0.4

## 2020-11-12 NOTE — Progress Notes (Signed)
PROGRESS NOTE    Joann Gutierrez  IRC:[id number] DOB: 08-22-1945 DOA: 11/09/2020 PCP: Andree Moro, DO   Brief Narrative: 75 year old with past medical history significant for ESRD on hemodialysis TTS, anemia, asthma, CAD, chronic diastolic heart failure, pulmonary hypertension, COPD advanced dementia, chronic ambulatory dysfunction, depression, type 2 diabetes, seizure, chronic respiratory failure on home oxygen, OSA on CPAP, CVA presents to the ED via EMS from her hemodialysis center after she completed full treatment, no fluid was removed.  After patient completed treatment she had a witnessed syncopal episode with seizure-like activity.  Brief episode of apnea, chest compressions started after which patient wake up.  Hypoglycemic CBG 53.  She was also hypotensive 53/27 when EMS arrived.  She received IV bolus.  In the ED blood pressure was 82/54.  She received fever IV fluids.  Patient has had poor oral intake on and off worse over the last 2 days.  Subsequently blood pressure improved after IV fluids in the ED.    Assessment & Plan:   Principal Problem:   Syncope Active Problems:   Acute on chronic respiratory failure (HCC)   Severe sepsis (HCC)   Hypotension   Hypoglycemia   Pressure injury of skin   1-Acute pulmonary embolism and left upper lobe, No right side heart strain per CT -Patient initially presented with worsening hypoxemia and hypotension. -Currently her blood pressure is stable 130s range. -Troponin not significantly elevated at 25 and 27. -She has been started on IV heparin drip, will continue -Doppler lower extremity positive for bilateral extensive DVT.  -Patient stable on 3 L. -Echo normal right ventricular function.  -Appreciate CCM evaluation, plan to continue with heparin Gtt.  2-SIRS: Patient presented with hypotension, tachycardia, lactic acidosis with lactic acid at 8, leukocytosis Related to hypotension, hypoperfusion, from PE. Plan to complete  cefepime for 5 days.  Follow blood cultures: No growth to date.  CT chest Negative for pneumonia, CT abdomen no evidence of infectious process. Pro-calcitonin elevated.  Discontinue vancomycin and flagyl 10/22.   3-Acute on chronic hypoxic respiratory failure: He required 5 L of oxygen on admission.  Currently on 3 L.  She is also on chronic oxygen at home. Likely related to PE.  Stable   4-Recurrent Hypoglycemia: Type 2 diabetes: Not on insulin or hypoglycemic agent. Poor oral intake. Per family information.  Continue with D10 Poor oral intake, caloric count, palliative care consulted.   5-mild troponin elevation: Likely demand, PE, mildly elevation  6-Anemia; hb down to 7. Received one unit PRBC. Monitor hb.   ESRD on hemodialysis TTS: Nephrology consulted , HD 10/22. Syncope; related to hypoglycemia, hypotension. Per CCM less likely relate to PE>  Chronic diastolic heart failure: Volume management with dialysis COPD: Continue with DuoNeb OSA: Continue with CPAP Dementia: Delirium: Patient gets agitated at times and sometimes hits staff Delirium precaution.  Questionable seizure: Likely related to hypoperfusion rather than real seizure EEG Ordered.   Estimated body mass index is 37.27 kg/m as calculated from the following:   Height as of this encounter: '5\' 4"'  (1.626 m).   Weight as of this encounter: 98.5 kg.   DVT prophylaxis: Heparin drip Code Status: Full code Family Communication: Boyfriend at bedside Disposition Plan:  Status is: Inpatient  Remains inpatient appropriate because: Patient presented with hypotension, questionable sepsis, acute PE        Consultants:  Nephrology  Procedures:  Echo Doppler lower extremity  Antimicrobials:    Subjective: Alert, not talking.  Per sister patient talk to  her. She didn't to talk to her today. Explain likely related to delirium with underline dementia.   Objective: Vitals:   11/11/20 1945 11/12/20  0045 11/12/20 0345 11/12/20 0721  BP: 133/83 (!) 142/77 (!) 146/84 122/62  Pulse: 90 81 86 78  Resp: '18 15 15 14  ' Temp: 97.8 F (36.6 C) 97.8 F (36.6 C) 97.8 F (36.6 C) (!) 97.5 F (36.4 C)  TempSrc: Oral Oral Oral Oral  SpO2:  95% 100% 99%  Weight:      Height:       No intake or output data in the 24 hours ending 11/12/20 1448  Filed Weights   11/10/20 1200 11/11/20 0854 11/11/20 1220  Weight: 99 kg 98.6 kg 98.5 kg    Examination:  General exam: NAD Respiratory system: BL crackles  Cardiovascular system: S 1, S 2 RRR Gastrointestinal system: BS present, soft, nt Central nervous system: Alert Extremities: trace edema  Data Reviewed: I have personally reviewed following labs and imaging studies  CBC: Recent Labs  Lab 11/09/20 1728 11/10/20 0227 11/10/20 0249 11/11/20 0500 11/12/20 0618  WBC 13.0*  --  12.5* 6.2 7.0  NEUTROABS 11.2*  --   --   --   --   HGB 9.9* 11.6* 9.0* 7.0* 8.3*  HCT 31.1* 34.0* 28.4* 21.4* 24.7*  MCV 74.0*  --  74.5* 72.8* 74.0*  PLT 183  --  146* 107* 89*    Basic Metabolic Panel: Recent Labs  Lab 11/09/20 1728 11/10/20 0227 11/10/20 0916  NA 138 140 139  K 3.9 3.8 3.8  CL 101  --  104  CO2 19*  --  25  GLUCOSE 172*  --  96  BUN 8  --  14  CREATININE 3.03*  --  3.84*  CALCIUM 7.6*  --  8.1*    GFR: Estimated Creatinine Clearance: 14.4 mL/min (A) (by C-G formula based on SCr of 3.84 mg/dL (H)). Liver Function Tests: Recent Labs  Lab 11/09/20 1728  AST 26  ALT 12  ALKPHOS 94  BILITOT 1.0  PROT 5.6*  ALBUMIN 2.3*    No results for input(s): LIPASE, AMYLASE in the last 168 hours. Recent Labs  Lab 11/10/20 0916  AMMONIA 11    Coagulation Profile: Recent Labs  Lab 11/10/20 1912  INR 1.7*    Cardiac Enzymes: No results for input(s): CKTOTAL, CKMB, CKMBINDEX, TROPONINI in the last 168 hours. BNP (last 3 results) No results for input(s): PROBNP in the last 8760 hours. HbA1C: Recent Labs    11/10/20 0249   HGBA1C 5.0    CBG: Recent Labs  Lab 11/11/20 1839 11/12/20 0101 11/12/20 0437 11/12/20 0724 11/12/20 1424  GLUCAP 121* 139* 122* 103* 109*    Lipid Profile: No results for input(s): CHOL, HDL, LDLCALC, TRIG, CHOLHDL, LDLDIRECT in the last 72 hours. Thyroid Function Tests: No results for input(s): TSH, T4TOTAL, FREET4, T3FREE, THYROIDAB in the last 72 hours. Anemia Panel: Recent Labs    11/10/20 0916  [id number]    Sepsis Labs: Recent Labs  Lab 11/09/20 1729 11/09/20 1955 11/10/20 0249  PROCALCITON  --   --  8.79  LATICACIDVEN 8.4* 6.9* 1.4     Recent Results (from the past 240 hour(s))  Blood culture (routine x 2)     Status: None (Preliminary result)   Collection Time: 11/09/20  5:34 PM   Specimen: BLOOD  Result Value Ref Range Status   Specimen Description BLOOD LEFT ANTECUBITAL  Final   Special Requests  Final    BOTTLES DRAWN AEROBIC AND ANAEROBIC Blood Culture adequate volume   Culture   Final    NO GROWTH 3 DAYS Performed at [facility], [place] [address], [place], [place] [address]    Report Status PENDING  Incomplete  Resp Panel by RT-PCR (Flu A&B, Covid) Nasopharyngeal Swab     Status: None   Collection Time: 11/09/20  7:24 PM   Specimen: Nasopharyngeal Swab; Nasopharyngeal(NP) swabs in vial transport medium  Result Value Ref Range Status   SARS Coronavirus 2 by RT PCR NEGATIVE NEGATIVE Final    Comment: (NOTE) SARS-CoV-2 target nucleic acids are NOT DETECTED.  The SARS-CoV-2 RNA is generally detectable in upper respiratory specimens during the acute phase of infection. The lowest concentration of SARS-CoV-2 viral copies this assay can detect is 138 copies/mL. A negative result does not preclude SARS-Cov-2 infection and should not be used as the sole basis for treatment or other patient management decisions. A negative result may occur with  improper specimen collection/handling, submission of specimen other than nasopharyngeal swab,  presence of viral mutation(s) within the areas targeted by this assay, and inadequate number of viral copies(<138 copies/mL). A negative result must be combined with clinical observations, patient history, and epidemiological information. The expected result is Negative.  Fact Sheet for Patients:  EntrepreneurPulse.com.au  Fact Sheet for Healthcare Providers:  IncredibleEmployment.be  This test is no t yet approved or cleared by the [place] FDA and  has been authorized for detection and/or diagnosis of SARS-CoV-2 by FDA under an Emergency Use Authorization (EUA). This EUA will remain  in effect (meaning this test can be used) for the duration of the COVID-19 declaration under Section 564(b)(1) of the Act, 21 U.S.C.section 360bbb-3(b)(1), unless the authorization is terminated  or revoked sooner.       Influenza A by PCR NEGATIVE NEGATIVE Final   Influenza B by PCR NEGATIVE NEGATIVE Final    Comment: (NOTE) The Xpert Xpress SARS-CoV-2/FLU/RSV plus assay is intended as an aid in the diagnosis of influenza from Nasopharyngeal swab specimens and should not be used as a sole basis for treatment. Nasal washings and aspirates are unacceptable for Xpert Xpress SARS-CoV-2/FLU/RSV testing.  Fact Sheet for Patients: EntrepreneurPulse.com.au  Fact Sheet for Healthcare Providers: IncredibleEmployment.be  This test is not yet approved or cleared by the [place] FDA and has been authorized for detection and/or diagnosis of SARS-CoV-2 by FDA under an Emergency Use Authorization (EUA). This EUA will remain in effect (meaning this test can be used) for the duration of the COVID-19 declaration under Section 564(b)(1) of the Act, 21 U.S.C. section 360bbb-3(b)(1), unless the authorization is terminated or revoked.  Performed at [facility], [place] [address], [place], [place] [address]            Radiology Studies: IR Fluoro Guide CV Line Left  Result Date: 11/11/2020 INDICATION: 75 year old female with history of end-stage renal disease on hemodialysis presenting with acute pulmonary embolism and poor venous access. EXAM: NON-TUNNELED CENTRAL VENOUS CATHETER PLACEMENT WITH ULTRASOUND AND FLUOROSCOPIC GUIDANCE COMPARISON:  CT chest from 11/10/2020 MEDICATIONS: None FLUOROSCOPY TIME:  0 minutes, 12 seconds (1 mGy) COMPLICATIONS: None immediate. PROCEDURE: Informed written consent was obtained from the patient's family after a discussion of the risks, benefits, and alternatives to treatment. Questions regarding the procedure were encouraged and answered. The left neck and chest were prepped with chlorhexidine in a sterile fashion, and a sterile drape was applied covering the operative field. Maximum barrier  sterile technique with sterile gowns and gloves were used for the procedure. A timeout was performed prior to the initiation of the procedure. After the overlying soft tissues were anesthetized, a small venotomy incision was created and a micropuncture kit was utilized to access the internal jugular vein. Real-time ultrasound guidance was utilized for vascular access including the acquisition of a permanent ultrasound image documenting patency of the accessed vessel. And 0.035 inch guidewire advanced to the level of the IVC. Under fluoroscopic guidance, the venotomy was serially dilated, ultimately allowing placement of a triple-lumen central venous catheter catheter with tip ultimately terminating within the superior cavoatrial junction. Final catheter positioning was confirmed and documented with a spot radiographic image. The catheter aspirates and flushes normally. The catheter was flushed with appropriate volume heparin dwells. The catheter exit site was secured with a 0-Silk retention suture. A dressing was placed. The patient tolerated the procedure well without immediate post  procedural complication. IMPRESSION: Successful placement of a left internal jugular approach triple-lumen central venous catheter with tip terminating with in the superior superior cavoatrial junction. PLAN: The catheter is ready for immediate use. Ruthann Cancer, MD [facility] Radiology Electronically Signed   By: Ruthann Cancer M.D.   On: 11/11/2020 08:21   IR US Guide Vasc Access Left  Result Date: 11/11/2020 INDICATION: 75 year old female with history of end-stage renal disease on hemodialysis presenting with acute pulmonary embolism and poor venous access. EXAM: NON-TUNNELED CENTRAL VENOUS CATHETER PLACEMENT WITH ULTRASOUND AND FLUOROSCOPIC GUIDANCE COMPARISON:  CT chest from 11/10/2020 MEDICATIONS: None FLUOROSCOPY TIME:  0 minutes, 12 seconds (1 mGy) COMPLICATIONS: None immediate. PROCEDURE: Informed written consent was obtained from the patient's family after a discussion of the risks, benefits, and alternatives to treatment. Questions regarding the procedure were encouraged and answered. The left neck and chest were prepped with chlorhexidine in a sterile fashion, and a sterile drape was applied covering the operative field. Maximum barrier sterile technique with sterile gowns and gloves were used for the procedure. A timeout was performed prior to the initiation of the procedure. After the overlying soft tissues were anesthetized, a small venotomy incision was created and a micropuncture kit was utilized to access the internal jugular vein. Real-time ultrasound guidance was utilized for vascular access including the acquisition of a permanent ultrasound image documenting patency of the accessed vessel. And 0.035 inch guidewire advanced to the level of the IVC. Under fluoroscopic guidance, the venotomy was serially dilated, ultimately allowing placement of a triple-lumen central venous catheter catheter with tip ultimately terminating within the superior  cavoatrial junction. Final catheter positioning was confirmed and documented with a spot radiographic image. The catheter aspirates and flushes normally. The catheter was flushed with appropriate volume heparin dwells. The catheter exit site was secured with a 0-Silk retention suture. A dressing was placed. The patient tolerated the procedure well without immediate post procedural complication. IMPRESSION: Successful placement of a left internal jugular approach triple-lumen central venous catheter with tip terminating with in the superior superior cavoatrial junction. PLAN: The catheter is ready for immediate use. Ruthann Cancer, MD [facility] Radiology Electronically Signed   By: Ruthann Cancer M.D.   On: 11/11/2020 08:21   ECHOCARDIOGRAM COMPLETE  Result Date: 11/11/2020    ECHOCARDIOGRAM REPORT   Patient Name:   Joann Gutierrez Date of Exam: 11/11/2020 Medical Rec #:  [id number]           Height:       64.0  in Accession #:    [id number]          Weight:       218.3 lb Date of Birth:  1945-06-27           BSA:          2.030 m Patient Age:    75 years            BP:           132/67 mmHg Patient Gender: F                   HR:           54 bpm. Exam Location:  Inpatient Procedure: 2D Echo, 3D Echo, Cardiac Doppler and Color Doppler Indications:    R55 Syncope  History:        Patient has prior history of Echocardiogram examinations, most                 recent 03/31/2020. COPD, Signs/Symptoms:Bacteremia, Alzheimer's,                 Altered Mental Status and Chest Pain; Risk Factors:Hypertension,                 Diabetes and Dyslipidemia. ESRD.  Sonographer:    Roseanna Rainbow RDCS Referring Phys: [id number] [facility]  Sonographer Comments: Technically difficult study due to poor echo windows. Patient in mits and in slight right decubitus position. Patient could not follow directions. Patient making repetitive movements throughout exam that corrupted EKG  signal. IMPRESSIONS  1. Peak LVOT velocity - 2.2 m/s, peak gradient 19 mmHg - no obstruction. Left ventricular ejection fraction, by estimation, is >75%. The left ventricle has hyperdynamic function. The left ventricle has no regional wall motion abnormalities. There is mild left ventricular hypertrophy. Left ventricular diastolic parameters are consistent with Grade I diastolic dysfunction (impaired relaxation).  2. Right ventricular systolic function is normal. The right ventricular size is normal. There is moderately elevated pulmonary artery systolic pressure.  3. The mitral valve is normal in structure. No evidence of mitral valve regurgitation. No evidence of mitral stenosis.  4. The aortic valve is normal in structure. Aortic valve regurgitation is not visualized. No aortic stenosis is present.  5. The inferior vena cava is normal in size with greater than 50% respiratory variability, suggesting right atrial pressure of 3 mmHg. Comparison(s): No significant change from prior study. Prior images reviewed side by side. FINDINGS  Left Ventricle: Peak LVOT velocity - 2.2 m/s, peak gradient 19 mmHg - no obstruction. Left ventricular ejection fraction, by estimation, is >75%. The left ventricle has hyperdynamic function. The left ventricle has no regional wall motion abnormalities.  3D left ventricular ejection fraction analysis performed but not reported based on interpreter judgement due to suboptimal quality. The left ventricular internal cavity size was normal in size. There is mild left ventricular hypertrophy. Left ventricular diastolic parameters are consistent with Grade I diastolic dysfunction (impaired relaxation). Right Ventricle: The right ventricular size is normal. No increase in right ventricular wall thickness. Right ventricular systolic function is normal. There is moderately elevated pulmonary artery systolic pressure. The tricuspid regurgitant velocity is 3.11 m/s, and with an assumed right  atrial pressure of 15 mmHg, the estimated right ventricular systolic pressure is 82.5 mmHg. Left Atrium: Left atrial size was normal in size. Right Atrium: Right atrial size was normal in size. Pericardium: There is no evidence of pericardial effusion. Mitral Valve: The mitral valve is  normal in structure. No evidence of mitral valve regurgitation. No evidence of mitral valve stenosis. Tricuspid Valve: The tricuspid valve is normal in structure. Tricuspid valve regurgitation is not demonstrated. No evidence of tricuspid stenosis. Aortic Valve: The aortic valve is normal in structure. Aortic valve regurgitation is not visualized. No aortic stenosis is present. Aortic valve peak gradient measures 3.2 mmHg. Pulmonic Valve: The pulmonic valve was normal in structure. Pulmonic valve regurgitation is not visualized. No evidence of pulmonic stenosis. Aorta: The aortic root is normal in size and structure. Venous: The inferior vena cava is normal in size with greater than 50% respiratory variability, suggesting right atrial pressure of 3 mmHg. IAS/Shunts: No atrial level shunt detected by color flow Doppler.  LEFT VENTRICLE PLAX 2D LVIDd:         2.65 cm     Diastology LVIDs:         1.60 cm     LV e' medial:    5.11 cm/s LV PW:         1.40 cm     LV E/e' medial:  10.2 LV IVS:        1.40 cm     LV e' lateral:   6.09 cm/s LVOT diam:     1.70 cm     LV E/e' lateral: 8.5 LV SV:         38 LV SV Index:   19 LVOT Area:     2.27 cm                             3D Volume EF: LV Volumes (MOD)           3D EF:        70 % LV vol d, MOD A2C: 29.5 ml LV EDV:       54 ml LV vol d, MOD A4C: 28.3 ml LV ESV:       16 ml LV vol s, MOD A2C: 6.7 ml  LV SV:        38 ml LV vol s, MOD A4C: 6.8 ml LV SV MOD A2C:     22.8 ml LV SV MOD A4C:     28.3 ml LV SV MOD BP:      22.7 ml RIGHT VENTRICLE             IVC RV S prime:     14.30 cm/s  IVC diam: 1.90 cm TAPSE (M-mode): 1.9 cm LEFT ATRIUM             Index       RIGHT ATRIUM          Index LA  diam:        3.10 cm 1.53 cm/m  RA Area:     5.02 cm LA Vol (A2C):   8.4 ml  4.16 ml/m  RA Volume:   6.16 ml  3.03 ml/m LA Vol (A4C):   18.4 ml 9.06 ml/m LA Biplane Vol: 13.8 ml 6.80 ml/m  AORTIC VALVE AV Area (Vmax): 2.15 cm AV Vmax:        89.90 cm/s AV Peak Grad:   3.2 mmHg LVOT Vmax:      85.00 cm/s LVOT Vmean:     55.600 cm/s LVOT VTI:       0.166 m  AORTA Ao Root diam: 3.25 cm Ao Asc diam:  2.60 cm MITRAL VALVE  TRICUSPID VALVE MV Area (PHT): 2.60 cm    TR Peak grad:   38.7 mmHg MV Decel Time: 292 msec    TR Vmax:        311.00 cm/s MV E velocity: 52.00 cm/s MV A velocity: 85.50 cm/s  SHUNTS MV E/A ratio:  0.61        Systemic VTI:  0.17 m                            Systemic Diam: 1.70 cm Candee Furbish MD Electronically signed by Candee Furbish MD Signature Date/Time: 11/11/2020/2:57:12 PM    Final    VAS [place] LOWER EXTREMITY VENOUS (DVT)  Result Date: 11/10/2020  Lower Venous DVT Study Patient Name:  PEDRO OLDENBURG  Date of Exam:   11/10/2020 Medical Rec #: [id number]            Accession #:    [id number] Date of Birth: 09/18/1945            Patient Gender: F Patient Age:   47 years Exam Location:  [facility] Procedure:      VAS [place] LOWER EXTREMITY VENOUS (DVT) Referring Phys: Jerald Kief Jahlon Baines --------------------------------------------------------------------------------  Indications: Positve d dimer.  Limitations: Patient positioning and body habitus. Comparison Study: 06/20/20 prior Performing Technologist: Archie Patten RVS  Examination Guidelines: A complete evaluation includes B-mode imaging, spectral Doppler, color Doppler, and power Doppler as needed of all accessible portions of each vessel. Bilateral testing is considered an integral part of a complete examination. Limited examinations for reoccurring indications may be performed as noted. The reflux portion of the exam is performed with the patient in reverse Trendelenburg.   +--------+---------------+---------+-----------+----------+--------------------+ RIGHT   CompressibilityPhasicitySpontaneityPropertiesThrombus Aging       +--------+---------------+---------+-----------+----------+--------------------+ CFV     None           No       No                   Age Indeterminate    +--------+---------------+---------+-----------+----------+--------------------+ SFJ     Full                                                              +--------+---------------+---------+-----------+----------+--------------------+ FV Prox None                                         Age Indeterminate    +--------+---------------+---------+-----------+----------+--------------------+ FV Mid  None                                         Age Indeterminate    +--------+---------------+---------+-----------+----------+--------------------+ FV      Partial        Yes      Yes                  Age Indeterminate    Distal                                                                    +--------+---------------+---------+-----------+----------+--------------------+  PFV     None                                         Age Indeterminate    +--------+---------------+---------+-----------+----------+--------------------+ POP     Partial        Yes      Yes                  Age Indeterminate    +--------+---------------+---------+-----------+----------+--------------------+ PTV     Partial                                      Age Indeterminate    +--------+---------------+---------+-----------+----------+--------------------+ PERO    Partial                                      Age Indeterminate    +--------+---------------+---------+-----------+----------+--------------------+ EIV                    Yes      Yes                  patent by color                                                           doppler               +--------+---------------+---------+-----------+----------+--------------------+   +--------+---------------+---------+-----------+----------+--------------------+ LEFT    CompressibilityPhasicitySpontaneityPropertiesThrombus Aging       +--------+---------------+---------+-----------+----------+--------------------+ CFV     None           Yes      Yes                  Age Indeterminate    +--------+---------------+---------+-----------+----------+--------------------+ SFJ     Full                                                              +--------+---------------+---------+-----------+----------+--------------------+ FV Prox Full                                                              +--------+---------------+---------+-----------+----------+--------------------+ FV Mid  Full                                                              +--------+---------------+---------+-----------+----------+--------------------+ FV      Full  Distal                                                                    +--------+---------------+---------+-----------+----------+--------------------+ PFV     Full                                                              +--------+---------------+---------+-----------+----------+--------------------+ POP     Full           Yes      Yes                                       +--------+---------------+---------+-----------+----------+--------------------+ PTV     Full                                                              +--------+---------------+---------+-----------+----------+--------------------+ PERO    Full                                                              +--------+---------------+---------+-----------+----------+--------------------+ EIV                    Yes      Yes                  patent by color                                                            doppler              +--------+---------------+---------+-----------+----------+--------------------+     Summary: RIGHT: - 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 - No cystic structure found in the popliteal fossa.  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 - No cystic structure found in the popliteal fossa.  *See table(s) above for measurements and observations. Electronically signed by Deitra Mayo MD on 11/10/2020 at 3:12:48 PM.    Final         Scheduled Meds:  sodium chloride   Intravenous Once   Chlorhexidine Gluconate Cloth  6 each Topical Q0600   [START ON 11/14/2020] darbepoetin (ARANESP) injection - DIALYSIS  200 mcg Intravenous Q Tue-HD   mouth rinse  15 mL Mouth Rinse BID   sodium chloride flush  10-40 mL Intracatheter Q12H   thiamine injection  100 mg Intravenous Daily   Continuous Infusions:  ceFEPime (MAXIPIME) IV     dextrose 50 mL/hr at 11/12/20 0750   heparin 650 Units/hr (11/11/20 1111)     LOS: 3 days    Time spent: 35 minutes.     [name], MD [facility]   If 7PM-7AM, please contact night-coverage [email or web address]  11/12/2020, 2:48 PM

## 2020-11-12 NOTE — Progress Notes (Signed)
EEG complete - results pending 

## 2020-11-12 NOTE — Progress Notes (Signed)
Patient is not able to wear CPAP at this time due to mittens and ALC.  Patient family agrees.

## 2020-11-12 NOTE — Progress Notes (Addendum)
[facility] Progress Note   Subjective:    Seen and examined patient at bedside. Patient currently sleeping and appears comfortable. Noted bilateral mittens in place. Ran even during yesterday's HD. S/p 1 unit PRBC 10/22 for Hgb 7-now 8.3. Plan for HD 10/25.  Objective Vitals:   11/11/20 1945 11/12/20 0045 11/12/20 0345 11/12/20 0721  BP: 133/83 (!) 142/77 (!) 146/84 122/62  Pulse: 90 81 86 78  Resp: 18 15 15 14   Temp: 97.8 F (36.6 C) 97.8 F (36.6 C) 97.8 F (36.6 C) (!) 97.5 F (36.4 C)  TempSrc: Oral Oral Oral Oral  SpO2:  95% 100% 99%  Weight:      Height:       Physical Exam General: Elderly woman; NAD Heart: Normal S1 and S2; No murmurs, gallops, or rubs Lungs: Clear throughout; No wheezing, rales, or rhonchi Abdomen: Soft and non-tender Extremities: No edema BLLE Dialysis Access: [facility] Other Access: L triple lumen central central line  Filed Weights   11/10/20 1200 11/11/20 0854 11/11/20 1220  Weight: 99 kg 98.6 kg 98.5 kg    Intake/Output Summary (Last 24 hours) at 11/12/2020 1003 Last data filed at 11/11/2020 1220 Gross per 24 hour  Intake 1106.48 ml  Output 0 ml  Net 1106.48 ml    Additional Objective Labs: Basic Metabolic Panel: Recent Labs  Lab 11/09/20 1728 11/10/20 0227 11/10/20 0916  NA 138 140 139  K 3.9 3.8 3.8  CL 101  --  104  CO2 19*  --  25  GLUCOSE 172*  --  96  BUN 8  --  14  CREATININE 3.03*  --  3.84*  CALCIUM 7.6*  --  8.1*   Liver Function Tests: Recent Labs  Lab 11/09/20 1728  AST 26  ALT 12  ALKPHOS 94  BILITOT 1.0  PROT 5.6*  ALBUMIN 2.3*   No results for input(s): LIPASE, AMYLASE in the last 168 hours. CBC: Recent Labs  Lab 11/09/20 1728 11/10/20 0227 11/10/20 0249 11/11/20 0500 11/12/20 0618  WBC 13.0*  --  12.5* 6.2 7.0  NEUTROABS 11.2*  --   --   --   --   HGB 9.9*   < > 9.0* 7.0* 8.3*  HCT 31.1*   < > 28.4* 21.4* 24.7*  MCV 74.0*  --  74.5* 72.8* 74.0*  PLT 183  --  146* 107* 89*    < > = values in this interval not displayed.   Blood Culture    Component Value Date/Time   SDES BLOOD LEFT ANTECUBITAL 11/09/2020 1734   SPECREQUEST  11/09/2020 1734    BOTTLES DRAWN AEROBIC AND ANAEROBIC Blood Culture adequate volume   CULT  11/09/2020 1734    NO GROWTH 2 DAYS Performed at [facility], [place] [address] [place], [place] [address]    REPTSTATUS PENDING 11/09/2020 1734    Cardiac Enzymes: No results for input(s): CKTOTAL, CKMB, CKMBINDEX, TROPONINI in the last 168 hours. CBG: Recent Labs  Lab 11/11/20 1247 11/11/20 1839 11/12/20 0101 11/12/20 0437 11/12/20 0724  GLUCAP 82 121* 139* 122* 103*   Iron Studies: No results for input(s): IRON, TIBC, TRANSFERRIN, FERRITIN in the last 72 hours. Lab Results  Component Value Date   INR 1.7 (H) 11/10/2020   INR 1.2 02/15/2020   INR 1.04 09/13/2013    Medications:  ceFEPime (MAXIPIME) IV     dextrose 50 mL/hr at 11/12/20 0750   heparin 650 Units/hr (11/11/20 1111)    sodium chloride  Intravenous Once   Chlorhexidine Gluconate Cloth  6 each Topical Q0600   mouth rinse  15 mL Mouth Rinse BID   thiamine injection  100 mg Intravenous Daily    Dialysis Orders: Center: [facility]  on T[facility] . 180NRe, 4 hours, BFR 400, DFR 500, EDW 77kg 4K, 2.25Ca, TDC Heparin 6000 unit bolus Mircera 266mcg IV q 2 weeks- last dose 10/31/20 Hectorol 3mcg IV q HD  Assessment/Plan: Syncope: Hypotensive on arrival despite no UF with HD, given 1.5L IVF total. CTA now showing PE. LE US performed (+) DVT left common femoral vein. Continue Heparin Gtt. ECHO completed 10/22. PE: heparin ordered, management per primary team  ESRD:  TTS schedule On HD today, so far tolerating UF. Plan for HD 10/25 per her usual schedule.  Hypertension/volume: Hypotensive on arrival, given 1.5L UF and now appears close to euvolemic, no edema on CT. Bps appears more improved. Monitor trends.  Minimal UF with HD 10/22.  Anemia: S/p 1 unit PRBC 10/22 for Hgb  7-now 8.3. Monitor trend. pT ON MAX esa IN op and last dose was on 10/11-Will order ESA here for 10/25 with HD. Give blood transfusion as needed.  Metabolic bone disease: Calcium controlled, phos level ordered in AM. Resume binders once eating.   Tobie Poet, NP [facility] 11/12/2020,10:03 AM  LOS: 3 days    Pt seen, examined and agree w A/P as above.  Kelly Splinter  MD 11/12/2020, 1:31 PM

## 2020-11-12 NOTE — Procedures (Addendum)
Patient Name: Joann Gutierrez  MRN: [id number]  Epilepsy Attending: Lora Havens  Referring Physician/Provider: Dr Derrick Ravel Date: 11/12/2020 Duration: 22.22 mins  Patient history: 75yo F with syncope. EEG to evaluate for seizure  Level of alertness: Awake  AEDs during EEG study: None  Technical aspects: This EEG study was done with scalp electrodes positioned according to the 10-20 International system of electrode placement. Electrical activity was acquired at a sampling rate of 500Hz  and reviewed with a high frequency filter of 70Hz  and a low frequency filter of 1Hz . EEG data were recorded continuously and digitally stored.   Description: The posterior dominant rhythm consists of 9 Hz activity of moderate voltage (25-35 uV) seen predominantly in posterior head regions, symmetric and reactive to eye opening and eye closing. EEG showed intermittent generalized 3-6hz  theta-delta slowing.  Hyperventilation and photic stimulation were not performed.     ABNORMALITY - Intermittent slow, generalized  IMPRESSION: This study is suggestive of mild diffuse encephalopathy, non specific etiology. No seizures or epileptiform discharges were seen throughout the recording.  [name] [name]

## 2020-11-12 NOTE — Progress Notes (Signed)
ANTICOAGULATION CONSULT NOTE - Follow-up Consult  Pharmacy Consult for Heparin Indication: pulmonary embolus  Allergies  Allergen Reactions   Morphine And Related Other (See Comments)    Family request not to be given, reports pt does not wake up when given    Promethazine Hcl Other (See Comments)    REACTION: lethargy    Patient Measurements: Height: 5\' 4"  (162.6 cm) Weight: 98.5 kg (217 lb 2.5 oz) IBW/kg (Calculated) : 54.7 Heparin Dosing Weight: 77.6 kg  Vital Signs: Temp: 97.5 F (36.4 C) (10/23 0721) Temp Source: Oral (10/23 0721) BP: 122/62 (10/23 0721) Pulse Rate: 78 (10/23 0721)  Labs: Recent Labs    11/09/20 1728 11/09/20 1929 11/10/20 0227 11/10/20 0249 11/10/20 0916 11/10/20 1912 11/10/20 1912 11/10/20 2047 11/11/20 0500 11/11/20 0630 11/11/20 1900 11/12/20 0618  HGB 9.9*  --    < > 9.0*  --   --   --   --  7.0*  --   --  8.3*  HCT 31.1*  --    < > 28.4*  --   --   --   --  21.4*  --   --  24.7*  PLT 183  --   --  146*  --   --   --   --  107*  --   --  89*  APTT  --   --   --   --   --  >200*  --  >200*  --   --   --   --   LABPROT  --   --   --   --   --  19.7*  --   --   --   --   --   --   INR  --   --   --   --   --  1.7*  --   --   --   --   --   --   HEPARINUNFRC  --   --   --   --   --  >1.10*   < > >1.10*  --  1.03* 0.67 0.50  CREATININE 3.03*  --   --   --  3.84*  --   --   --   --   --   --   --   TROPONINIHS 27* 25*  --   --   --   --   --   --   --   --   --   --    < > = values in this interval not displayed.    Estimated Creatinine Clearance: 14.4 mL/min (A) (by C-G formula based on SCr of 3.84 mg/dL (H)).   Medical History: Past Medical History:  Diagnosis Date   Adenomatous colon polyp    Allergy    Anemia    Asthma        CAD (coronary artery disease)    Mild very minimal coronary disease with 20% obtuse marginal stenosis   Carpal tunnel syndrome on left    Cataract    CHF (congestive heart failure) (HCC)    Chronic  kidney disease (CKD), stage III (moderate) (HCC)    now stage 4   COPD (chronic obstructive pulmonary disease) ([place])    Dementia with behavioral disturbance 03/09/2020   Depression    Diabetes mellitus 1997   Type II    Diverticulosis    Dyspnea    Elevated diaphragm November 2011   Right side  Esophageal dysmotility    Esophageal stricture    ESRD on hemodialysis ([place]) 03/09/2020   Gastritis    Gastroparesis 08/21/2007   GERD (gastroesophageal reflux disease)    Hearing loss of both ears    Hernia, hiatal    Hyperlipidemia    Hypertension    Morbid obesity ([place])    OSTEOARTHRITIS 08/09/2006   Osteoporosis    Oxygen deficiency    prn    PERIPHERAL NEUROPATHY, FEET 09/23/2007   RENAL INSUFFICIENCY 02/16/2009   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 Pharmacy consulted for heparin dosing. Patient started on IV heparin.   Heparin level therapeutic at 0.50 this morning, on 650 units/hr. CBC stable. No line issues or signs/symptoms of bleeding noted per RN.  Goal of Therapy:  Heparin level 0.3-0.7 units/ml Monitor platelets by anticoagulation protocol: Yes   Plan:  Continue heparin at 650 units/hr. Daily heparin level and CBC. Monitor for signs/symptoms of bleeding.   Vance Peper, PharmD PGY1 Pharmacy Resident Phone [phone] 11/12/2020 9:14 AM   Please check AMION for all [facility] phone numbers After 10:00 PM, call [place] [phone]

## 2020-11-12 NOTE — Progress Notes (Signed)
Patient's RN consulted IV team regarding TLC on Lt. IJ. White port - no blood return, sluggish flushing. Other ports were okay to draw blood. Ordered tPA and informed patient's RN. Floor doesn't stock tPA. Patient's RN will let IV team know when he receives tPA from pharmacy. HS [employer]

## 2020-11-13 ENCOUNTER — Encounter (HOSPITAL_COMMUNITY): Payer: Self-pay

## 2020-11-13 ENCOUNTER — Other Ambulatory Visit (HOSPITAL_COMMUNITY): Payer: Self-pay

## 2020-11-13 DIAGNOSIS — R55 Syncope and collapse: Secondary | ICD-10-CM | POA: Diagnosis not present

## 2020-11-13 LAB — BASIC METABOLIC PANEL
Anion gap: 10 (ref 5–15)
BUN: 9 mg/dL (ref 8–23)
CO2: 23 mmol/L (ref 22–32)
Calcium: 7.9 mg/dL — ABNORMAL LOW (ref 8.9–10.3)
Chloride: 95 mmol/L — ABNORMAL LOW (ref 98–111)
Creatinine, Ser: 3.79 mg/dL — ABNORMAL HIGH (ref 0.44–1.00)
GFR, Estimated: 12 mL/min — ABNORMAL LOW (ref >60)
Glucose, Bld: 125 mg/dL — ABNORMAL HIGH (ref 70–99)
Potassium: 3 mmol/L — ABNORMAL LOW (ref 3.5–5.1)
Sodium: 128 mmol/L — ABNORMAL LOW (ref 135–145)

## 2020-11-13 LAB — RENAL FUNCTION PANEL
Albumin: 1.9 g/dL — ABNORMAL LOW (ref 3.5–5.0)
Anion gap: 8 (ref 5–15)
BUN: 9 mg/dL (ref 8–23)
CO2: 24 mmol/L (ref 22–32)
Calcium: 8 mg/dL — ABNORMAL LOW (ref 8.9–10.3)
Chloride: 96 mmol/L — ABNORMAL LOW (ref 98–111)
Creatinine, Ser: 3.79 mg/dL — ABNORMAL HIGH (ref 0.44–1.00)
GFR, Estimated: 12 mL/min — ABNORMAL LOW (ref >60)
Glucose, Bld: 123 mg/dL — ABNORMAL HIGH (ref 70–99)
Phosphorus: 2.2 mg/dL — ABNORMAL LOW (ref 2.5–4.6)
Potassium: 3.1 mmol/L — ABNORMAL LOW (ref 3.5–5.1)
Sodium: 128 mmol/L — ABNORMAL LOW (ref 135–145)

## 2020-11-13 LAB — CBC
HCT: 25 % — ABNORMAL LOW (ref 36.0–46.0)
Hemoglobin: 8.4 g/dL — ABNORMAL LOW (ref 12.0–15.0)
MCH: 24.8 pg — ABNORMAL LOW (ref 26.0–34.0)
MCHC: 33.6 g/dL (ref 30.0–36.0)
MCV: 73.7 fL — ABNORMAL LOW (ref 80.0–100.0)
Platelets: 90 10*3/uL — ABNORMAL LOW (ref 150–400)
RBC: 3.39 MIL/uL — ABNORMAL LOW (ref 3.87–5.11)
RDW: 24.9 % — ABNORMAL HIGH (ref 11.5–15.5)
WBC: 7.4 10*3/uL (ref 4.0–10.5)
nRBC: 0.4 % — ABNORMAL HIGH (ref 0.0–0.2)

## 2020-11-13 LAB — GLUCOSE, CAPILLARY
Glucose-Capillary: 108 mg/dL — ABNORMAL HIGH (ref 70–99)
Glucose-Capillary: 114 mg/dL — ABNORMAL HIGH (ref 70–99)
Glucose-Capillary: 117 mg/dL — ABNORMAL HIGH (ref 70–99)
Glucose-Capillary: 121 mg/dL — ABNORMAL HIGH (ref 70–99)
Glucose-Capillary: 154 mg/dL — ABNORMAL HIGH (ref 70–99)
Glucose-Capillary: 167 mg/dL — ABNORMAL HIGH (ref 70–99)

## 2020-11-13 LAB — PHOSPHORUS: Phosphorus: 2.2 mg/dL — ABNORMAL LOW (ref 2.5–4.6)

## 2020-11-13 LAB — HEPARIN LEVEL (UNFRACTIONATED): Heparin Unfractionated: 0.48 IU/mL (ref 0.30–0.70)

## 2020-11-13 MED ORDER — APIXABAN 5 MG PO TABS: 5.0000 mg | ORAL_TABLET | Freq: Two times a day (BID) | ORAL | Status: DC

## 2020-11-13 MED ORDER — APIXABAN 5 MG PO TABS
10.0000 mg | ORAL_TABLET | Freq: Two times a day (BID) | ORAL | Status: DC
  Administered 2020-11-13 – 2020-11-14 (×2): 10 mg via ORAL
  Filled 2020-11-13 (×5): qty 2

## 2020-11-13 MED ORDER — RENA-VITE PO TABS
1.0000 | ORAL_TABLET | Freq: Every day | ORAL | Status: DC
  Administered 2020-11-14 – 2020-11-21 (×4): 1 via ORAL
  Filled 2020-11-13 (×6): qty 1

## 2020-11-13 MED ORDER — NEPRO/CARBSTEADY PO LIQD
237.0000 mL | ORAL | Status: DC
  Administered 2020-11-13: 237 mL via ORAL

## 2020-11-13 MED ORDER — CHLORHEXIDINE GLUCONATE CLOTH 2 % EX PADS
6.0000 | MEDICATED_PAD | Freq: Every day | CUTANEOUS | Status: DC
  Administered 2020-11-14 – 2020-11-15 (×2): 6 via TOPICAL

## 2020-11-13 NOTE — Progress Notes (Addendum)
[facility] Progress Note   Subjective:   Seen in room, opens eyes but does not respond to any questions today. Mittens in place.   Objective Vitals:   11/12/20 1928 11/13/20 0200 11/13/20 0443 11/13/20 0736  BP: (!) 160/75 (!) 154/76 (!) 143/70 (!) 144/84  Pulse: 95 81 82 84  Resp: 15 14 14 14   Temp: 98.8 F (37.1 C) 98.2 F (36.8 C) 97.8 F (36.6 C) 98.7 F (37.1 C)  TempSrc: Axillary Axillary Axillary Axillary  SpO2: 99%   99%  Weight:      Height:       Physical Exam General: Elderly woman; NAD Heart: Normal S1 and S2; No murmurs, gallops, or rubs Lungs: CTA bilaterally Abdomen: Soft and non-tender Extremities: 1+ pedal edema bilateral lower extremities Dialysis Access: [facility] Other Access: L triple lumen central central line w/large bandage  Additional Objective Labs: Basic Metabolic Panel: Recent Labs  Lab 11/09/20 1728 11/10/20 0227 11/10/20 0916  NA 138 140 139  K 3.9 3.8 3.8  CL 101  --  104  CO2 19*  --  25  GLUCOSE 172*  --  96  BUN 8  --  14  CREATININE 3.03*  --  3.84*  CALCIUM 7.6*  --  8.1*   Liver Function Tests: Recent Labs  Lab 11/09/20 1728  AST 26  ALT 12  ALKPHOS 94  BILITOT 1.0  PROT 5.6*  ALBUMIN 2.3*   No results for input(s): LIPASE, AMYLASE in the last 168 hours. CBC: Recent Labs  Lab 11/09/20 1728 11/10/20 0227 11/10/20 0249 11/11/20 0500 11/12/20 0618  WBC 13.0*  --  12.5* 6.2 7.0  NEUTROABS 11.2*  --   --   --   --   HGB 9.9*   < > 9.0* 7.0* 8.3*  HCT 31.1*   < > 28.4* 21.4* 24.7*  MCV 74.0*  --  74.5* 72.8* 74.0*  PLT 183  --  146* 107* 89*   < > = values in this interval not displayed.   Blood Culture    Component Value Date/Time   SDES BLOOD LEFT ANTECUBITAL 11/09/2020 1734   SPECREQUEST  11/09/2020 1734    BOTTLES DRAWN AEROBIC AND ANAEROBIC Blood Culture adequate volume   CULT  11/09/2020 1734    NO GROWTH 4 DAYS Performed at [facility], [place] [address], [place], [place]  [address]    REPTSTATUS PENDING 11/09/2020 1734    Cardiac Enzymes: No results for input(s): CKTOTAL, CKMB, CKMBINDEX, TROPONINI in the last 168 hours. CBG: Recent Labs  Lab 11/12/20 1424 11/12/20 2102 11/13/20 0034 11/13/20 0442 11/13/20 0735  GLUCAP 109* 120* 121* 117* 114*   Iron Studies: No results for input(s): IRON, TIBC, TRANSFERRIN, FERRITIN in the last 72 hours. @lablastinr3 @ Studies/Results: DG CHEST PORT 1 VIEW  Result Date: 11/12/2020 CLINICAL DATA:  Obstruction of central line. EXAM: PORTABLE CHEST 1 VIEW COMPARISON:  Chest x-ray 11/09/2020 FINDINGS: Interval placement of a left sided approach central venous catheter with tip overlying the expected region of the superior caval junction. Right chest wall dialysis catheter with tip overlying the right atrium likely in the region of the inferior caval junction. The heart and mediastinal contours are within normal limits. Persistently elevated right hemidiaphragm. No focal consolidation. No pulmonary edema. No pleural effusion. No pneumothorax. No acute osseous abnormality. IMPRESSION: 1. Interval placement of a left sided approach central venous catheter with tip overlying the expected region of the superior caval junction. 2. Right chest wall  dialysis catheter with tip overlying the right atrium likely in the region of the inferior caval junction. 3. No acute cardiopulmonary abnormality. Electronically Signed   By: Iven Finn M.D.   On: 11/12/2020 22:26   EEG adult  Result Date: 11/12/2020 Lora Havens, MD     11/12/2020  4:46 PM Patient Name: KAHLAN ENGEBRETSON MRN: [id number] Epilepsy Attending: Lora Havens Referring Physician/Provider: Dr Derrick Ravel Date: 11/12/2020 Duration: 22.22 mins Patient history: 75yo F with syncope. EEG to evaluate for seizure Level of alertness: Awake AEDs during EEG study: None Technical aspects: This EEG study was done with scalp electrodes positioned according to the 10-20  International system of electrode placement. Electrical activity was acquired at a sampling rate of 500Hz  and reviewed with a high frequency filter of 70Hz  and a low frequency filter of 1Hz . EEG data were recorded continuously and digitally stored. Description: The posterior dominant rhythm consists of 9 Hz activity of moderate voltage (25-35 uV) seen predominantly in posterior head regions, symmetric and reactive to eye opening and eye closing. EEG showed intermittent generalized 3-6hz  theta-delta slowing.  Hyperventilation and photic stimulation were not performed.   ABNORMALITY - Intermittent slow, generalized IMPRESSION: This study is suggestive of mild diffuse encephalopathy, non specific etiology. No seizures or epileptiform discharges were seen throughout the recording. Lora Havens   ECHOCARDIOGRAM COMPLETE  Result Date: 11/11/2020    ECHOCARDIOGRAM REPORT   Patient Name:   PEGGE CUMBERLEDGE Date of Exam: 11/11/2020 Medical Rec #:  [id number]           Height:       64.0 in Accession #:    [id number]          Weight:       218.3 lb Date of Birth:  Jun 09, 1945           BSA:          2.030 m Patient Age:    75 years            BP:           132/67 mmHg Patient Gender: F                   HR:           54 bpm. Exam Location:  Inpatient Procedure: 2D Echo, 3D Echo, Cardiac Doppler and Color Doppler Indications:    R55 Syncope  History:        Patient has prior history of Echocardiogram examinations, most                 recent 03/31/2020. COPD, Signs/Symptoms:Bacteremia, Alzheimer's,                 Altered Mental Status and Chest Pain; Risk Factors:Hypertension,                 Diabetes and Dyslipidemia. ESRD.  Sonographer:    Roseanna Rainbow RDCS Referring Phys: [id number] [facility]  Sonographer Comments: Technically difficult study due to poor echo windows. Patient in mits and in slight right decubitus position. Patient could not follow directions. Patient making repetitive movements throughout exam  that corrupted EKG signal. IMPRESSIONS  1. Peak LVOT velocity - 2.2 m/s, peak gradient 19 mmHg - no obstruction. Left ventricular ejection fraction, by estimation, is >75%. The left ventricle has hyperdynamic function. The left ventricle has no regional wall motion abnormalities. There is mild left ventricular hypertrophy. Left ventricular diastolic parameters are consistent with Grade  I diastolic dysfunction (impaired relaxation).  2. Right ventricular systolic function is normal. The right ventricular size is normal. There is moderately elevated pulmonary artery systolic pressure.  3. The mitral valve is normal in structure. No evidence of mitral valve regurgitation. No evidence of mitral stenosis.  4. The aortic valve is normal in structure. Aortic valve regurgitation is not visualized. No aortic stenosis is present.  5. The inferior vena cava is normal in size with greater than 50% respiratory variability, suggesting right atrial pressure of 3 mmHg. Comparison(s): No significant change from prior study. Prior images reviewed side by side. FINDINGS  Left Ventricle: Peak LVOT velocity - 2.2 m/s, peak gradient 19 mmHg - no obstruction. Left ventricular ejection fraction, by estimation, is >75%. The left ventricle has hyperdynamic function. The left ventricle has no regional wall motion abnormalities.  3D left ventricular ejection fraction analysis performed but not reported based on interpreter judgement due to suboptimal quality. The left ventricular internal cavity size was normal in size. There is mild left ventricular hypertrophy. Left ventricular diastolic parameters are consistent with Grade I diastolic dysfunction (impaired relaxation). Right Ventricle: The right ventricular size is normal. No increase in right ventricular wall thickness. Right ventricular systolic function is normal. There is moderately elevated pulmonary artery systolic pressure. The tricuspid regurgitant velocity is 3.11 m/s, and with an  assumed right atrial pressure of 15 mmHg, the estimated right ventricular systolic pressure is 99.2 mmHg. Left Atrium: Left atrial size was normal in size. Right Atrium: Right atrial size was normal in size. Pericardium: There is no evidence of pericardial effusion. Mitral Valve: The mitral valve is normal in structure. No evidence of mitral valve regurgitation. No evidence of mitral valve stenosis. Tricuspid Valve: The tricuspid valve is normal in structure. Tricuspid valve regurgitation is not demonstrated. No evidence of tricuspid stenosis. Aortic Valve: The aortic valve is normal in structure. Aortic valve regurgitation is not visualized. No aortic stenosis is present. Aortic valve peak gradient measures 3.2 mmHg. Pulmonic Valve: The pulmonic valve was normal in structure. Pulmonic valve regurgitation is not visualized. No evidence of pulmonic stenosis. Aorta: The aortic root is normal in size and structure. Venous: The inferior vena cava is normal in size with greater than 50% respiratory variability, suggesting right atrial pressure of 3 mmHg. IAS/Shunts: No atrial level shunt detected by color flow Doppler.  LEFT VENTRICLE PLAX 2D LVIDd:         2.65 cm     Diastology LVIDs:         1.60 cm     LV e' medial:    5.11 cm/s LV PW:         1.40 cm     LV E/e' medial:  10.2 LV IVS:        1.40 cm     LV e' lateral:   6.09 cm/s LVOT diam:     1.70 cm     LV E/e' lateral: 8.5 LV SV:         38 LV SV Index:   19 LVOT Area:     2.27 cm                             3D Volume EF: LV Volumes (MOD)           3D EF:        70 % LV vol d, MOD A2C: 29.5 ml LV EDV:  54 ml LV vol d, MOD A4C: 28.3 ml LV ESV:       16 ml LV vol s, MOD A2C: 6.7 ml  LV SV:        38 ml LV vol s, MOD A4C: 6.8 ml LV SV MOD A2C:     22.8 ml LV SV MOD A4C:     28.3 ml LV SV MOD BP:      22.7 ml RIGHT VENTRICLE             IVC RV S prime:     14.30 cm/s  IVC diam: 1.90 cm TAPSE (M-mode): 1.9 cm LEFT ATRIUM             Index       RIGHT ATRIUM           Index LA diam:        3.10 cm 1.53 cm/m  RA Area:     5.02 cm LA Vol (A2C):   8.4 ml  4.16 ml/m  RA Volume:   6.16 ml  3.03 ml/m LA Vol (A4C):   18.4 ml 9.06 ml/m LA Biplane Vol: 13.8 ml 6.80 ml/m  AORTIC VALVE AV Area (Vmax): 2.15 cm AV Vmax:        89.90 cm/s AV Peak Grad:   3.2 mmHg LVOT Vmax:      85.00 cm/s LVOT Vmean:     55.600 cm/s LVOT VTI:       0.166 m  AORTA Ao Root diam: 3.25 cm Ao Asc diam:  2.60 cm MITRAL VALVE               TRICUSPID VALVE MV Area (PHT): 2.60 cm    TR Peak grad:   38.7 mmHg MV Decel Time: 292 msec    TR Vmax:        311.00 cm/s MV E velocity: 52.00 cm/s MV A velocity: 85.50 cm/s  SHUNTS MV E/A ratio:  0.61        Systemic VTI:  0.17 m                            Systemic Diam: 1.70 cm Candee Furbish MD Electronically signed by Candee Furbish MD Signature Date/Time: 11/11/2020/2:57:12 PM    Final    Medications:  sodium chloride     ceFEPime (MAXIPIME) IV 1 g (11/12/20 2317)   heparin 650 Units/hr (11/12/20 2240)    sodium chloride   Intravenous Once   Chlorhexidine Gluconate Cloth  6 each Topical Q0600   [START ON 11/14/2020] darbepoetin (ARANESP) injection - DIALYSIS  200 mcg Intravenous Q Tue-HD   mouth rinse  15 mL Mouth Rinse BID   sodium chloride flush  10-40 mL Intracatheter Q12H   thiamine injection  100 mg Intravenous Daily    Dialysis Orders: Center: [facility]  on TTS . 180NRe, 4 hours, BFR 400, DFR 500, EDW 77kg 4K, 2.25Ca, TDC Heparin 6000 unit bolus Mircera 223mcg IV q 2 weeks- last dose 10/31/20 Hectorol 42mcg IV q HD  Assessment/Plan: Syncope: Hypotensive on arrival despite no UF with HD, given 1.5L IVF total. CTA now showing PE. LE US performed (+) DVT left common femoral vein. Continue Heparin Gtt. ECHO completed 10/22. PE: heparin ordered, management per primary team  ESRD:  TTS schedule  Plan for HD 10/25 per her usual schedule.  Hypertension/volume: Hypotensive on arrival, given 1.5L UF and now appears close to euvolemic, no edema on CT. Bps  appears more improved. Monitor trends.  Minimal UF with HD 10/22. Weights here are much higher than EDW. She is slightly volume overloaded but does not appear 20+ kg up. Will increase UF goal with HD tomorrow.   Anemia: S/p 1 unit PRBC 10/22 for Hgb 7-now 8.3. Monitor trend. Pr was on max ESA outpatient and last dose was on 10/11-Will order ESA here for 10/25 with HD. Give blood transfusion as needed.  Metabolic bone disease: Calcium controlled, phos level  pending. Resume binders once eating.   Anice Paganini, PA-C 11/13/2020, 11:16 AM  [facility] Pager: [phone]  Nephrology attending: Patient was seen and examined.  Chart reviewed.  I agree with above. ESRD on HD admitted with syncope found to have acute PE.  Currently on IV heparin and Coumadin.  Plan for next HD tomorrow.  Katheran James, MD [facility].

## 2020-11-13 NOTE — Progress Notes (Signed)
ANTICOAGULATION CONSULT NOTE - Follow-up Consult  Pharmacy Consult for Heparin Indication: pulmonary embolus  Allergies  Allergen Reactions   Morphine And Related Other (See Comments)    Family request not to be given, reports pt does not wake up when given    Promethazine Hcl Other (See Comments)    REACTION: lethargy    Patient Measurements: Height: 5\' 4"  (162.6 cm) Weight: 98.5 kg (217 lb 2.5 oz) IBW/kg (Calculated) : 54.7 Heparin Dosing Weight: 77.6 kg  Vital Signs: Temp: 97.9 F (36.6 C) (10/24 1146) Temp Source: Axillary (10/24 1146) BP: 139/71 (10/24 1146) Pulse Rate: 84 (10/24 1146)  Labs: Recent Labs     0000 11/10/20 1912 11/10/20 2047 11/11/20 0500 11/11/20 0630 11/11/20 1900 11/12/20 0618 11/13/20 1048 11/13/20 1100  HGB   < >  --   --  7.0*  --   --  8.3*  --  8.4*  HCT  --   --   --  21.4*  --   --  24.7*  --  25.0*  PLT  --   --   --  107*  --   --  89*  --  90*  APTT  --  >200* >200*  --   --   --   --   --   --   LABPROT  --  19.7*  --   --   --   --   --   --   --   INR  --  1.7*  --   --   --   --   --   --   --   HEPARINUNFRC  --  >1.10* >1.10*  --    < > 0.67 0.50 0.48  --    < > = values in this interval not displayed.     Estimated Creatinine Clearance: 14.4 mL/min (A) (by C-G formula based on SCr of 3.84 mg/dL (H)).   Medical History: Past Medical History:  Diagnosis Date   Adenomatous colon polyp    Allergy    Anemia    Asthma        CAD (coronary artery disease)    Mild very minimal coronary disease with 20% obtuse marginal stenosis   Carpal tunnel syndrome on left    Cataract    CHF (congestive heart failure) (HCC)    Chronic kidney disease (CKD), stage III (moderate) (HCC)    now stage 4   COPD (chronic obstructive pulmonary disease) ([place])    Dementia with behavioral disturbance 03/09/2020   Depression    Diabetes mellitus 1997   Type II    Diverticulosis    Dyspnea    Elevated diaphragm November 2011   Right side    Esophageal dysmotility    Esophageal stricture    ESRD on hemodialysis ([place]) 03/09/2020   Gastritis    Gastroparesis 08/21/2007   GERD (gastroesophageal reflux disease)    Hearing loss of both ears    Hernia, hiatal    Hyperlipidemia    Hypertension    Morbid obesity ([place])    OSTEOARTHRITIS 08/09/2006   Osteoporosis    Oxygen deficiency    prn    PERIPHERAL NEUROPATHY, FEET 09/23/2007   RENAL INSUFFICIENCY 02/16/2009   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 Pharmacy consulted for heparin dosing. Patient started on IV heparin.   Heparin level therapeutic at 0.48 this morning. No bleeding issues noted. New orders to transition to apixaban.   Goal of Therapy:  Heparin level 0.3-0.7 units/ml Monitor platelets by anticoagulation protocol: Yes   Plan:  Transition to apixaban 10mg  bid x 7 days then 5mg  bid Will check into cost and provide education prior to d/c  Erin Hearing PharmD., BCPS Clinical Pharmacist 11/13/2020 1:09 PM

## 2020-11-13 NOTE — Care Management Important Message (Signed)
Important Message  Patient Details  Name: LERONDA LEWERS MRN: [id number] Date of Birth: 1945/07/21   Medicare Important Message Given:  Yes     [name] 11/13/2020, 11:05 AM

## 2020-11-13 NOTE — TOC Benefit Eligibility Note (Signed)
Test claim ran for this patient #60/30day as $9.85 co-pay

## 2020-11-13 NOTE — Progress Notes (Signed)
Prior to line removal, patient's nurse was informed that patient is to remain supine in bed for 30 minutes after line removal. HOB was lowered to 0 degrees by this RN. Patient was instructed to hold her breath during line removal, but due to dementia it is unknown if patient understood directions. Pressure was held to insertion site for 16 minutes with vaseline gauze and 2x2 gauze. Catheter tip was intact upon removal and was easily pulled out. Pressure dressing was applied with rolled 2x2 gauze and medipore tape. No bleeding noted at this time. HOB remains at 0 degrees and SR up X 4. This RN reminded patient's RN that patient is to remain flat for a total of 30 minutes post line removal and to watch for bleeding or hematoma development.

## 2020-11-13 NOTE — Progress Notes (Addendum)
Initial Nutrition Assessment  DOCUMENTATION CODES:   Obesity unspecified  INTERVENTION:   Nepro Shake po once daily, each supplement provides 425 kcal and 19 grams protein. Renal MVI daily.  NUTRITION DIAGNOSIS:   Increased nutrient needs related to wound healing, chronic illness (ESRD on HD) as evidenced by estimated needs.  GOAL:   Patient will meet greater than or equal to 90% of their needs  MONITOR:   PO intake, Supplement acceptance  REASON FOR ASSESSMENT:   Consult Calorie Count  ASSESSMENT:   75 yo female admitted with syncopal episode with seizure like activity after OP HD session, SIRS, acute on chronic hypoxic respiratory failure. PMH includes ESRD on HD, anemia, asthma, CAD, HF, pulmonary HTN, COPD, advanced dementia, DM-2, seizure, chronic respiratory failure on home oxygen, OSA, CVA.  Patient found to have acute pulmonary embolism. Transitioning to Eliquis today. Currently on 3 L oxygen via nasal cannula.  Currently on a dysphagia 3 diet with thin liquids. Meal intakes: 100% of lunch today.  Patient with recent poor intake for the past few days per review of progress notes. Calorie count has been ordered.   Labs reviewed. Na 128, K 3.1, phos 2.2 CBG: [phone]  Medications reviewed and include Aranesp, thiamine.  Weight history reviewed. Weight has been stable over the past several months. Per RN assessment, patient has +1, mild pitting edema to BLE. Edema could be masking weight loss.  NUTRITION - FOCUSED PHYSICAL EXAM:  Unable to complete  Diet Order:   Diet Order             DIET DYS 3 Room service appropriate? Yes; Fluid consistency: Thin  Diet effective now                   EDUCATION NEEDS:   No education needs have been identified at this time  Skin:  Skin Assessment: Skin Integrity Issues: Skin Integrity Issues:: Stage II Stage II: L buttocks  Last BM:  no BM documented  Height:   Ht Readings from Last 1 Encounters:   11/10/20 5\' 4"  (5.456 m)    Weight:   Wt Readings from Last 1 Encounters:  11/11/20 98.5 kg    BMI:  Body mass index is 37.27 kg/m.  Estimated Nutritional Needs:   Kcal:  2000-2300  Protein:  100-120 gm  Fluid:  1 L + UOP    Lucas Mallow, RD, LDN, CNSC Please refer to Amion for contact information.

## 2020-11-13 NOTE — Progress Notes (Signed)
Pt observed eating 1 cup of apple sauce, 1 Cup of Pudding at 100 %. Pt is also currently eating her lunch tray by herself. Dr. Tyrell Antonio updated via secure chat.

## 2020-11-13 NOTE — Progress Notes (Signed)
Pt receives out-pt HD at [facility] on TTS. Pt arrives at 11:15 for 11:30 chair time. Will follow and assist.   Melven Sartorius Renal Navigator [phone]

## 2020-11-13 NOTE — Progress Notes (Signed)
PROGRESS NOTE    Joann Gutierrez  OZH:[id number] DOB: 04-01-1945 DOA: 11/09/2020 PCP: Andree Moro, DO   Brief Narrative: 75 year old with past medical history significant for ESRD on hemodialysis TTS, anemia, asthma, CAD, chronic diastolic heart failure, pulmonary hypertension, COPD advanced dementia, chronic ambulatory dysfunction, depression, type 2 diabetes, seizure, chronic respiratory failure on home oxygen, OSA on CPAP, CVA presents to the ED via EMS from her hemodialysis center after she completed full treatment, no fluid was removed.  After patient completed treatment she had a witnessed syncopal episode with seizure-like activity.  Brief episode of apnea, chest compressions started after which patient wake up.  Hypoglycemic CBG 53.  She was also hypotensive 53/27 when EMS arrived.  She received IV bolus.  In the ED blood pressure was 82/54.  She received fever IV fluids.  Patient has had poor oral intake on and off worse over the last 2 days.  Subsequently blood pressure improved after IV fluids in the ED.    Assessment & Plan:   Principal Problem:   Syncope Active Problems:   Acute on chronic respiratory failure (HCC)   Severe sepsis (HCC)   Hypotension   Hypoglycemia   Pressure injury of skin   1-Acute pulmonary embolism and left upper lobe, No right side heart strain per CT -Patient initially presented with worsening hypoxemia and hypotension. -Currently her blood pressure is stable 130s range. -Troponin not significantly elevated at 25 and 27. -She has been started on IV heparin drip, will continue -Doppler lower extremity positive for bilateral extensive DVT.  -Patient stable on 3 L. -Echo normal right ventricular function.  -Appreciate CCM evaluation, recommend tx with heparin.  Plan to transition to Eliquis today.   2-SIRS: Patient presented with hypotension, tachycardia, lactic acidosis with lactic acid at 8, leukocytosis Related to hypotension,  hypoperfusion, from PE. Plan to complete cefepime for 5 days.  Follow blood cultures: No growth to date.  CT chest Negative for pneumonia, CT abdomen no evidence of infectious process. Pro-calcitonin elevated.  Discontinue vancomycin and flagyl 10/22.   3-Acute on chronic hypoxic respiratory failure: He required 5 L of oxygen on admission.  Currently on 3 L.  She is also on chronic oxygen at home. Likely related to PE.  Stable   4-Recurrent Hypoglycemia: Type 2 diabetes: Not on insulin or hypoglycemic agent. Poor oral intake. Per family information.  Plan to hold D 10 IV fluids, monitor CBG off fluid.  Poor oral intake, caloric count, palliative care consulted.   5-Mild troponin elevation: Likely demand, PE, mildly elevation  6-Anemia; hb down to 7. Received one unit PRBC. Monitor hb.  Hb stable today at 8.   ESRD on hemodialysis TTS: Nephrology consulted , HD 10/22. Syncope; related to hypoglycemia, hypotension. Per CCM less likely relate to PE>  Chronic diastolic heart failure: Volume management with dialysis COPD: Continue with DuoNeb OSA: Continue with CPAP Dementia: Delirium: Patient gets agitated at times and sometimes hits staff Delirium precaution. She has not been talking.  CT head on admission negative.  EEG negative for seizure.   Questionable seizure: Likely related to hypoperfusion rather than real seizure EEG Ordered.   Estimated body mass index is 37.27 kg/m as calculated from the following:   Height as of this encounter: 5\' 4"  (1.626 m).   Weight as of this encounter: 98.5 kg.   DVT prophylaxis: Heparin drip Code Status: Full code Family Communication: sister who is POA 10/23 Disposition Plan:  Status is: Inpatient  Remains inpatient appropriate because:  Patient presented with hypotension, questionable sepsis, acute PE. PT/OT consult        Consultants:  Nephrology  Procedures:  Echo Doppler lower extremity  Antimicrobials:     Subjective: She is alert, does not respond to questions.    Objective: Vitals:   11/13/20 0200 11/13/20 0443 11/13/20 0736 11/13/20 1146  BP: (!) 154/76 (!) 143/70 (!) 144/84 139/71  Pulse: 81 82 84 84  Resp: 14 14 14 17   Temp: 98.2 F (36.8 C) 97.8 F (36.6 C) 98.7 F (37.1 C) 97.9 F (36.6 C)  TempSrc: Axillary Axillary Axillary Axillary  SpO2:   99% 100%  Weight:      Height:       No intake or output data in the 24 hours ending 11/13/20 1257  Filed Weights   11/10/20 1200 11/11/20 0854 11/11/20 1220  Weight: 99 kg 98.6 kg 98.5 kg    Examination:  General exam: NAD Respiratory system: BL crackles.  Cardiovascular system: S 1, S 2 RRR Gastrointestinal system: BS present, soft, nt Central nervous system: Alert Extremities: trace edema  Data Reviewed: I have personally reviewed following labs and imaging studies  CBC: Recent Labs  Lab 11/09/20 1728 11/10/20 0227 11/10/20 0249 11/11/20 0500 11/12/20 0618 11/13/20 1100  WBC 13.0*  --  12.5* 6.2 7.0 7.4  NEUTROABS 11.2*  --   --   --   --   --   HGB 9.9* 11.6* 9.0* 7.0* 8.3* 8.4*  HCT 31.1* 34.0* 28.4* 21.4* 24.7* 25.0*  MCV 74.0*  --  74.5* 72.8* 74.0* 73.7*  PLT 183  --  146* 107* 89* 90*    Basic Metabolic Panel: Recent Labs  Lab 11/09/20 1728 11/10/20 0227 11/10/20 0916  NA 138 140 139  K 3.9 3.8 3.8  CL 101  --  104  CO2 19*  --  25  GLUCOSE 172*  --  96  BUN 8  --  14  CREATININE 3.03*  --  3.84*  CALCIUM 7.6*  --  8.1*    GFR: Estimated Creatinine Clearance: 14.4 mL/min (A) (by C-G formula based on SCr of 3.84 mg/dL (H)). Liver Function Tests: Recent Labs  Lab 11/09/20 1728  AST 26  ALT 12  ALKPHOS 94  BILITOT 1.0  PROT 5.6*  ALBUMIN 2.3*    No results for input(s): LIPASE, AMYLASE in the last 168 hours. Recent Labs  Lab 11/10/20 0916  AMMONIA 11    Coagulation Profile: Recent Labs  Lab 11/10/20 1912  INR 1.7*    Cardiac Enzymes: No results for input(s):  CKTOTAL, CKMB, CKMBINDEX, TROPONINI in the last 168 hours. BNP (last 3 results) No results for input(s): PROBNP in the last 8760 hours. HbA1C: No results for input(s): HGBA1C in the last 72 hours.  CBG: Recent Labs  Lab 11/12/20 2102 11/13/20 0034 11/13/20 0442 11/13/20 0735 11/13/20 1155  GLUCAP 120* 121* 117* 114* 108*    Lipid Profile: No results for input(s): CHOL, HDL, LDLCALC, TRIG, CHOLHDL, LDLDIRECT in the last 72 hours. Thyroid Function Tests: No results for input(s): TSH, T4TOTAL, FREET4, T3FREE, THYROIDAB in the last 72 hours. Anemia Panel: No results for input(s): VITAMINB12, FOLATE, FERRITIN, TIBC, IRON, RETICCTPCT in the last 72 hours.  Sepsis Labs: Recent Labs  Lab 11/09/20 1729 11/09/20 1955 11/10/20 0249  PROCALCITON  --   --  8.79  LATICACIDVEN 8.4* 6.9* 1.4     Recent Results (from the past 240 hour(s))  Blood culture (routine x 2)  Status: None (Preliminary result)   Collection Time: 11/09/20  5:34 PM   Specimen: BLOOD  Result Value Ref Range Status   Specimen Description BLOOD LEFT ANTECUBITAL  Final   Special Requests   Final    BOTTLES DRAWN AEROBIC AND ANAEROBIC Blood Culture adequate volume   Culture   Final    NO GROWTH 4 DAYS Performed at [facility], [address] [place], [place] [address]    Report Status PENDING  Incomplete  Resp Panel by RT-PCR (Flu A&B, Covid) Nasopharyngeal Swab     Status: None   Collection Time: 11/09/20  7:24 PM   Specimen: Nasopharyngeal Swab; Nasopharyngeal(NP) swabs in vial transport medium  Result Value Ref Range Status   SARS Coronavirus 2 by RT PCR NEGATIVE NEGATIVE Final    Comment: (NOTE) SARS-CoV-2 target nucleic acids are NOT DETECTED.  The SARS-CoV-2 RNA is generally detectable in upper respiratory specimens during the acute phase of infection. The lowest concentration of SARS-CoV-2 viral copies this assay can detect is 138 copies/mL. A negative result does not preclude  SARS-Cov-2 infection and should not be used as the sole basis for treatment or other patient management decisions. A negative result may occur with  improper specimen collection/handling, submission of specimen other than nasopharyngeal swab, presence of viral mutation(s) within the areas targeted by this assay, and inadequate number of viral copies(<138 copies/mL). A negative result must be combined with clinical observations, patient history, and epidemiological information. The expected result is Negative.  Fact Sheet for Patients:  EntrepreneurPulse.com.au  Fact Sheet for Healthcare Providers:  IncredibleEmployment.be  This test is no t yet approved or cleared by the [place] FDA and  has been authorized for detection and/or diagnosis of SARS-CoV-2 by FDA under an Emergency Use Authorization (EUA). This EUA will remain  in effect (meaning this test can be used) for the duration of the COVID-19 declaration under Section 564(b)(1) of the Act, 21 U.S.C.section 360bbb-3(b)(1), unless the authorization is terminated  or revoked sooner.       Influenza A by PCR NEGATIVE NEGATIVE Final   Influenza B by PCR NEGATIVE NEGATIVE Final    Comment: (NOTE) The Xpert Xpress SARS-CoV-2/FLU/RSV plus assay is intended as an aid in the diagnosis of influenza from Nasopharyngeal swab specimens and should not be used as a sole basis for treatment. Nasal washings and aspirates are unacceptable for Xpert Xpress SARS-CoV-2/FLU/RSV testing.  Fact Sheet for Patients: EntrepreneurPulse.com.au  Fact Sheet for Healthcare Providers: IncredibleEmployment.be  This test is not yet approved or cleared by the [place] FDA and has been authorized for detection and/or diagnosis of SARS-CoV-2 by FDA under an Emergency Use Authorization (EUA). This EUA will remain in effect (meaning this test can be used) for the duration of  the COVID-19 declaration under Section 564(b)(1) of the Act, 21 U.S.C. section 360bbb-3(b)(1), unless the authorization is terminated or revoked.  Performed at [facility], [place] [address], [place],  [address]           Radiology Studies: DG CHEST PORT 1 VIEW  Result Date: 11/12/2020 CLINICAL DATA:  Obstruction of central line. EXAM: PORTABLE CHEST 1 VIEW COMPARISON:  Chest x-ray 11/09/2020 FINDINGS: Interval placement of a left sided approach central venous catheter with tip overlying the expected region of the superior caval junction. Right chest wall dialysis catheter with tip overlying the right atrium likely in the region of the inferior caval junction. The heart and mediastinal contours are within normal limits. Persistently  elevated right hemidiaphragm. No focal consolidation. No pulmonary edema. No pleural effusion. No pneumothorax. No acute osseous abnormality. IMPRESSION: 1. Interval placement of a left sided approach central venous catheter with tip overlying the expected region of the superior caval junction. 2. Right chest wall dialysis catheter with tip overlying the right atrium likely in the region of the inferior caval junction. 3. No acute cardiopulmonary abnormality. Electronically Signed   By: Iven Finn M.D.   On: 11/12/2020 22:26   EEG adult  Result Date: 11/12/2020 Lora Havens, MD     11/12/2020  4:46 PM Patient Name: Joann Gutierrez MRN: [id number] Epilepsy Attending: Lora Havens Referring Physician/Provider: Dr Derrick Ravel Date: 11/12/2020 Duration: 22.22 mins Patient history: 75yo F with syncope. EEG to evaluate for seizure Level of alertness: Awake AEDs during EEG study: None Technical aspects: This EEG study was done with scalp electrodes positioned according to the 10-20 International system of electrode placement. Electrical activity was acquired at a sampling rate of 500Hz  and reviewed with a high frequency filter of 70Hz   and a low frequency filter of 1Hz . EEG data were recorded continuously and digitally stored. Description: The posterior dominant rhythm consists of 9 Hz activity of moderate voltage (25-35 uV) seen predominantly in posterior head regions, symmetric and reactive to eye opening and eye closing. EEG showed intermittent generalized 3-6hz  theta-delta slowing.  Hyperventilation and photic stimulation were not performed.   ABNORMALITY - Intermittent slow, generalized IMPRESSION: This study is suggestive of mild diffuse encephalopathy, non specific etiology. No seizures or epileptiform discharges were seen throughout the recording. Lora Havens   ECHOCARDIOGRAM COMPLETE  Result Date: 11/11/2020    ECHOCARDIOGRAM REPORT   Patient Name:   MELLANIE BEJARANO Date of Exam: 11/11/2020 Medical Rec #:  [id number]           Height:       64.0 in Accession #:    [id number]          Weight:       218.3 lb Date of Birth:  16-Apr-1945           BSA:          2.030 m Patient Age:    68 years            BP:           132/67 mmHg Patient Gender: F                   HR:           54 bpm. Exam Location:  Inpatient Procedure: 2D Echo, 3D Echo, Cardiac Doppler and Color Doppler Indications:    R55 Syncope  History:        Patient has prior history of Echocardiogram examinations, most                 recent 03/31/2020. COPD, Signs/Symptoms:Bacteremia, Alzheimer's,                 Altered Mental Status and Chest Pain; Risk Factors:Hypertension,                 Diabetes and Dyslipidemia. ESRD.  Sonographer:    Roseanna Rainbow RDCS Referring Phys: [id number] [facility]  Sonographer Comments: Technically difficult study due to poor echo windows. Patient in mits and in slight right decubitus position. Patient could not follow directions. Patient making repetitive movements throughout exam that corrupted EKG signal. IMPRESSIONS  1. Peak LVOT velocity - 2.2 m/s,  peak gradient 19 mmHg - no obstruction. Left ventricular ejection fraction, by  estimation, is >75%. The left ventricle has hyperdynamic function. The left ventricle has no regional wall motion abnormalities. There is mild left ventricular hypertrophy. Left ventricular diastolic parameters are consistent with Grade I diastolic dysfunction (impaired relaxation).  2. Right ventricular systolic function is normal. The right ventricular size is normal. There is moderately elevated pulmonary artery systolic pressure.  3. The mitral valve is normal in structure. No evidence of mitral valve regurgitation. No evidence of mitral stenosis.  4. The aortic valve is normal in structure. Aortic valve regurgitation is not visualized. No aortic stenosis is present.  5. The inferior vena cava is normal in size with greater than 50% respiratory variability, suggesting right atrial pressure of 3 mmHg. Comparison(s): No significant change from prior study. Prior images reviewed side by side. FINDINGS  Left Ventricle: Peak LVOT velocity - 2.2 m/s, peak gradient 19 mmHg - no obstruction. Left ventricular ejection fraction, by estimation, is >75%. The left ventricle has hyperdynamic function. The left ventricle has no regional wall motion abnormalities.  3D left ventricular ejection fraction analysis performed but not reported based on interpreter judgement due to suboptimal quality. The left ventricular internal cavity size was normal in size. There is mild left ventricular hypertrophy. Left ventricular diastolic parameters are consistent with Grade I diastolic dysfunction (impaired relaxation). Right Ventricle: The right ventricular size is normal. No increase in right ventricular wall thickness. Right ventricular systolic function is normal. There is moderately elevated pulmonary artery systolic pressure. The tricuspid regurgitant velocity is 3.11 m/s, and with an assumed right atrial pressure of 15 mmHg, the estimated right ventricular systolic pressure is 25.4 mmHg. Left Atrium: Left atrial size was normal in  size. Right Atrium: Right atrial size was normal in size. Pericardium: There is no evidence of pericardial effusion. Mitral Valve: The mitral valve is normal in structure. No evidence of mitral valve regurgitation. No evidence of mitral valve stenosis. Tricuspid Valve: The tricuspid valve is normal in structure. Tricuspid valve regurgitation is not demonstrated. No evidence of tricuspid stenosis. Aortic Valve: The aortic valve is normal in structure. Aortic valve regurgitation is not visualized. No aortic stenosis is present. Aortic valve peak gradient measures 3.2 mmHg. Pulmonic Valve: The pulmonic valve was normal in structure. Pulmonic valve regurgitation is not visualized. No evidence of pulmonic stenosis. Aorta: The aortic root is normal in size and structure. Venous: The inferior vena cava is normal in size with greater than 50% respiratory variability, suggesting right atrial pressure of 3 mmHg. IAS/Shunts: No atrial level shunt detected by color flow Doppler.  LEFT VENTRICLE PLAX 2D LVIDd:         2.65 cm     Diastology LVIDs:         1.60 cm     LV e' medial:    5.11 cm/s LV PW:         1.40 cm     LV E/e' medial:  10.2 LV IVS:        1.40 cm     LV e' lateral:   6.09 cm/s LVOT diam:     1.70 cm     LV E/e' lateral: 8.5 LV SV:         38 LV SV Index:   19 LVOT Area:     2.27 cm  3D Volume EF: LV Volumes (MOD)           3D EF:        70 % LV vol d, MOD A2C: 29.5 ml LV EDV:       54 ml LV vol d, MOD A4C: 28.3 ml LV ESV:       16 ml LV vol s, MOD A2C: 6.7 ml  LV SV:        38 ml LV vol s, MOD A4C: 6.8 ml LV SV MOD A2C:     22.8 ml LV SV MOD A4C:     28.3 ml LV SV MOD BP:      22.7 ml RIGHT VENTRICLE             IVC RV S prime:     14.30 cm/s  IVC diam: 1.90 cm TAPSE (M-mode): 1.9 cm LEFT ATRIUM             Index       RIGHT ATRIUM          Index LA diam:        3.10 cm 1.53 cm/m  RA Area:     5.02 cm LA Vol (A2C):   8.4 ml  4.16 ml/m  RA Volume:   6.16 ml  3.03 ml/m LA Vol  (A4C):   18.4 ml 9.06 ml/m LA Biplane Vol: 13.8 ml 6.80 ml/m  AORTIC VALVE AV Area (Vmax): 2.15 cm AV Vmax:        89.90 cm/s AV Peak Grad:   3.2 mmHg LVOT Vmax:      85.00 cm/s LVOT Vmean:     55.600 cm/s LVOT VTI:       0.166 m  AORTA Ao Root diam: 3.25 cm Ao Asc diam:  2.60 cm MITRAL VALVE               TRICUSPID VALVE MV Area (PHT): 2.60 cm    TR Peak grad:   38.7 mmHg MV Decel Time: 292 msec    TR Vmax:        311.00 cm/s MV E velocity: 52.00 cm/s MV A velocity: 85.50 cm/s  SHUNTS MV E/A ratio:  0.61        Systemic VTI:  0.17 m                            Systemic Diam: 1.70 cm Candee Furbish MD Electronically signed by Candee Furbish MD Signature Date/Time: 11/11/2020/2:57:12 PM    Final         Scheduled Meds:  sodium chloride   Intravenous Once   [START ON 11/14/2020] Chlorhexidine Gluconate Cloth  6 each Topical Q0600   [START ON 11/14/2020] darbepoetin (ARANESP) injection - DIALYSIS  200 mcg Intravenous Q Tue-HD   mouth rinse  15 mL Mouth Rinse BID   sodium chloride flush  10-40 mL Intracatheter Q12H   thiamine injection  100 mg Intravenous Daily   Continuous Infusions:  sodium chloride     ceFEPime (MAXIPIME) IV 1 g (11/12/20 2317)   heparin 650 Units/hr (11/12/20 2240)     LOS: 4 days    Time spent: 35 minutes.     [name], MD [facility]   If 7PM-7AM, please contact night-coverage [email or web address]  11/13/2020, 12:57 PM

## 2020-11-14 DIAGNOSIS — R55 Syncope and collapse: Secondary | ICD-10-CM | POA: Diagnosis not present

## 2020-11-14 LAB — CBC
HCT: 25.4 % — ABNORMAL LOW (ref 36.0–46.0)
Hemoglobin: 8.3 g/dL — ABNORMAL LOW (ref 12.0–15.0)
MCH: 24.7 pg — ABNORMAL LOW (ref 26.0–34.0)
MCHC: 32.7 g/dL (ref 30.0–36.0)
MCV: 75.6 fL — ABNORMAL LOW (ref 80.0–100.0)
Platelets: 98 10*3/uL — ABNORMAL LOW (ref 150–400)
RBC: 3.36 MIL/uL — ABNORMAL LOW (ref 3.87–5.11)
RDW: 25 % — ABNORMAL HIGH (ref 11.5–15.5)
WBC: 5.8 10*3/uL (ref 4.0–10.5)
nRBC: 0.5 % — ABNORMAL HIGH (ref 0.0–0.2)

## 2020-11-14 LAB — RENAL FUNCTION PANEL
Albumin: 1.9 g/dL — ABNORMAL LOW (ref 3.5–5.0)
Anion gap: 7 (ref 5–15)
BUN: 8 mg/dL (ref 8–23)
CO2: 25 mmol/L (ref 22–32)
Calcium: 7.6 mg/dL — ABNORMAL LOW (ref 8.9–10.3)
Chloride: 99 mmol/L (ref 98–111)
Creatinine, Ser: 2.78 mg/dL — ABNORMAL HIGH (ref 0.44–1.00)
GFR, Estimated: 17 mL/min — ABNORMAL LOW (ref >60)
Glucose, Bld: 113 mg/dL — ABNORMAL HIGH (ref 70–99)
Phosphorus: 1.6 mg/dL — ABNORMAL LOW (ref 2.5–4.6)
Potassium: 3.7 mmol/L (ref 3.5–5.1)
Sodium: 131 mmol/L — ABNORMAL LOW (ref 135–145)

## 2020-11-14 LAB — GLUCOSE, CAPILLARY
Glucose-Capillary: 67 mg/dL — ABNORMAL LOW (ref 70–99)
Glucose-Capillary: 72 mg/dL (ref 70–99)
Glucose-Capillary: 211 mg/dL — ABNORMAL HIGH (ref 70–99)
Glucose-Capillary: 212 mg/dL — ABNORMAL HIGH (ref 70–99)

## 2020-11-14 LAB — CULTURE, BLOOD (ROUTINE X 2)
Culture: NO GROWTH
Special Requests: ADEQUATE

## 2020-11-14 MED ORDER — HEPARIN SODIUM (PORCINE) 1000 UNIT/ML IJ SOLN
INTRAMUSCULAR | Status: AC
  Filled 2020-11-14: qty 4

## 2020-11-14 MED ORDER — NEPRO/CARBSTEADY PO LIQD
237.0000 mL | Freq: Three times a day (TID) | ORAL | Status: DC
  Administered 2020-11-15 – 2020-11-23 (×14): 237 mL via ORAL

## 2020-11-14 NOTE — Progress Notes (Signed)
Initial Nutrition Assessment  DOCUMENTATION CODES:   Obesity unspecified  INTERVENTION:   Increase Nepro Shake to PO TID, each supplement provides 425 kcal and 19 grams protein. Continue Renal MVI daily. D/C calorie count.  NUTRITION DIAGNOSIS:   Increased nutrient needs related to wound healing, chronic illness (ESRD on HD) as evidenced by estimated needs.  Ongoing   GOAL:   Patient will meet greater than or equal to 90% of their needs  Progressing   MONITOR:   PO intake, Supplement acceptance  REASON FOR ASSESSMENT:   Consult Calorie Count  ASSESSMENT:   75 yo female admitted with syncopal episode with seizure like activity after OP HD session, SIRS, acute on chronic hypoxic respiratory failure. PMH includes ESRD on HD, anemia, asthma, CAD, HF, pulmonary HTN, COPD, advanced dementia, DM-2, seizure, chronic respiratory failure on home oxygen, OSA, CVA.  Patient remains on a dysphagia 3 diet with thin liquids. Meal intakes not recorded. No meal tickets saved for calorie count.  Per RN, patient did not eat lunch. Blood glucose became low and RN got patient to drink some Nepro to help increase her blood glucose.  Labs reviewed. Na 131, phos 1.6 CBG: 67-72  Medications reviewed and include Aranesp, Rena-vit, IV thiamine.  Weight taken today, 77.9 kg, down from 94 kg on 5/26. 17% weight loss within 6 months is severe.   Diet Order:   Diet Order             DIET DYS 3 Room service appropriate? Yes; Fluid consistency: Thin  Diet effective now                   EDUCATION NEEDS:   No education needs have been identified at this time  Skin:  Skin Assessment: Skin Integrity Issues: Skin Integrity Issues:: Stage II Stage II: L and R buttocks  Last BM:  no BM documented  Height:   Ht Readings from Last 1 Encounters:  11/10/20 5\' 4"  (1.626 m)    Weight:   Wt Readings from Last 1 Encounters:  11/14/20 77.9 kg    BMI:  Body mass index is 29.48  kg/m.  Estimated Nutritional Needs:   Kcal:  2000-2300  Protein:  100-120 gm  Fluid:  1 L + UOP    Lucas Mallow, RD, LDN, CNSC Please refer to Amion for contact information.

## 2020-11-14 NOTE — Evaluation (Signed)
Occupational Therapy Evaluation Patient Details Name: Joann Gutierrez MRN: [id number] DOB: 11-28-1945 Today's Date: 11/14/2020   History of Present Illness 75 yo female presenting to ED on 10/20 with syncopal/seizure episode at dialysis center. Chest compressions started and quickly stopped after patient woke up. Head CT negative. PMH including arthma, CHF, DM, depression, dementia with behavioral disturbances, HTN, L TKA (1998), and AV fistula placement (2020).   Clinical Impression   Unsure of PLOF or home support as pt unable to provide information with current orientation and cognitive status.  Pt currently requiring Total A for ADLs and Max-Total A +2 for bed mobility. Pt presenting with decreased balance, strength, cognition, and activity tolerance. Pt would benefit from further acute OT to facilitate safe dc. Recommend dc to SNF for further OT to optimize safety, independence with ADLs, and return to PLOF.      Recommendations for follow up therapy are one component of a multi-disciplinary discharge planning process, led by the attending physician.  Recommendations may be updated based on patient status, additional functional criteria and insurance authorization.   Follow Up Recommendations  Skilled nursing-short term rehab (<3 hours/day)    Assistance Recommended at Discharge Frequent or constant Supervision/Assistance  Functional Status Assessment  Patient has had a recent decline in their functional status and/or demonstrates limited ability to make significant improvements in function in a reasonable and predictable amount of time   Equipment Recommendations  Other (comment) (Defer to next venue)    Recommendations for Other Services PT consult;Speech consult     Precautions / Restrictions Precautions Precautions: Fall      Mobility Bed Mobility Overal bed mobility: Needs Assistance Bed Mobility: Supine to Sit;Sit to Supine     Supine to sit: Max assist;+2 for  physical assistance;HOB elevated Sit to supine: Total assist;+2 for physical assistance   General bed mobility comments: Max A +2 to bring BLEs towards [facility] and being hips forward with bed pad. Assist to elevate trunk. Tolerating sitting for ~ 55minutes. Total A +2 to assist back to supine    Transfers                   General transfer comment: Defer due to safety      Balance Overall balance assessment: Needs assistance Sitting-balance support: No upper extremity supported;Feet supported Sitting balance-[name]: Fair Sitting balance - Comments: static sitting with close min guard for safety                                   ADL either performed or assessed with clinical judgement   ADL Overall ADL's : Needs assistance/impaired Eating/Feeding: Total assistance;Bed level Eating/Feeding Details (indicate cue type and reason): Pt nodding head no and pushing therpaists hand away when attempting to eat lunch. Optimizing upright posture with HOB eleavted                                   General ADL Comments: Pt requiring Total A for ADLs due to decreased orientation and cognition. Sitting at EOB and attempting to eat lunch, pt pushing therapist's hand away     Vision Baseline Vision/History: 1 Wears glasses       Perception     Praxis      Pertinent Vitals/Pain Pain Assessment: Faces Faces Pain Scale: No hurt Pain Intervention(s): Monitored during session  Hand Dominance     Extremity/Trunk Assessment Upper Extremity Assessment Upper Extremity Assessment: Difficult to assess due to impaired cognition   Lower Extremity Assessment Lower Extremity Assessment: Defer to PT evaluation   Cervical / Trunk Assessment Cervical / Trunk Assessment: Kyphotic   Communication Communication Communication: Other (comment) (Pt nodding head no only)   Cognition Arousal/Alertness: Awake/alert Behavior During Therapy: Restless Overall  Cognitive Status: No family/caregiver present to determine baseline cognitive functioning                                 General Comments: Pt fidgeting and grasping onto her gown, blanket, and towel. Pt not speaking; nodding head no ~4 times during session. Nodding head yes when asking her if she wants to lay back in bed.     General Comments  VSS on RA    Exercises     Shoulder Instructions      Home Living Family/patient expects to be discharged to:: Skilled nursing facility                                 Additional Comments: Pt unable to provide information      Prior Functioning/Environment Prior Level of Function : Patient poor historian/Family not available                        OT Problem List: Decreased strength;Decreased range of motion;Decreased activity tolerance;Impaired balance (sitting and/or standing);Decreased cognition;Decreased safety awareness;Decreased knowledge of use of DME or AE;Decreased knowledge of precautions      OT Treatment/Interventions: Self-care/ADL training;Therapeutic exercise;Energy conservation;DME and/or AE instruction;Therapeutic activities;Patient/family education    OT Goals(Current goals can be found in the care plan section) Acute Rehab OT Goals Patient Stated Goal: Unstated OT Goal Formulation: Patient unable to participate in goal setting Time For Goal Achievement: 11/28/20 Potential to Achieve Goals: Good ADL Goals Pt Will Perform Eating: with min assist;bed level;sitting Pt Will Perform Grooming: with min assist;sitting;bed level Pt Will Transfer to Toilet: with mod assist;stand pivot transfer;bedside commode Additional ADL Goal #1: Pt performing bed mobility with Min A in preparation for ADLs Additional ADL Goal #2: Pt will follow one step commands during ADLs with Mod cues  OT Frequency: Min 2X/week   Barriers to D/C:            Co-evaluation              AM-PAC OT "6  Clicks" Daily Activity     Outcome Measure Help from another person eating meals?: Total Help from another person taking care of personal grooming?: Total Help from another person toileting, which includes using toliet, bedpan, or urinal?: Total Help from another person bathing (including washing, rinsing, drying)?: Total Help from another person to put on and taking off regular upper body clothing?: Total Help from another person to put on and taking off regular lower body clothing?: Total 6 Click Score: 6   End of Session Nurse Communication: Mobility status  Activity Tolerance: Patient limited by fatigue (Limited by cognition) Patient left: in bed;with call bell/phone within reach;with bed alarm set  OT Visit Diagnosis: Unsteadiness on feet (R26.81);Other abnormalities of gait and mobility (R26.89);Muscle weakness (generalized) (M62.81)                Time: 9509-3267 OT Time Calculation (min): 17 min Charges:  OT General  Charges $OT Visit: 1 Visit OT Evaluation $OT Eval Moderate Complexity: [place], OTR/L Acute Rehab Pager: [phone] Office: [place] 11/14/2020, 2:36 PM

## 2020-11-14 NOTE — Progress Notes (Signed)
PT Cancellation Note  Patient Details Name: TIMBERLY YOTT MRN: [id number] DOB: [birth year]   Cancelled Treatment:    Reason Eval/Treat Not Completed: Patient at procedure or test/unavailable; patient in HD this am.  Will attempt later as schedule allows.   Reginia Naas 11/14/2020, 9:39 AM Magda Kiel, PT Acute Rehabilitation Services OZYYQ:[phone] Office:[phone] 11/14/2020

## 2020-11-14 NOTE — Progress Notes (Signed)
OT Cancellation Note  Patient Details Name: Joann Gutierrez MRN: [id number] DOB: 07/29/1945   Cancelled Treatment:    Reason Eval/Treat Not Completed: Patient at procedure or test/ unavailable (HD. Will return as schedule allows.)  [place], OTR/L Acute Rehab Pager: [phone] Office: [phone] 11/14/2020, 8:17 AM

## 2020-11-14 NOTE — Progress Notes (Addendum)
[facility] Progress Note   Subjective:   Seen on HD, mittens had to be placed back on for safety because she was scratching/pulling at her HD lines. She is alert but not answering questions. K+ 3.0- using 4K bath.   Objective Vitals:   11/13/20 2057 11/14/20 0343 11/14/20 0703 11/14/20 0709  BP: 137/72 (!) 141/74 (!) 152/63 (!) 162/69  Pulse: 99 99 (!) 102 94  Resp: 19 13 (!) 22   Temp: 99.1 F (37.3 C) 98.1 F (36.7 C) 98.6 F (37 C)   TempSrc: Oral Oral Oral   SpO2: 96% 100%    Weight:   78.7 kg   Height:       Physical Exam General: WDWN female, alert and in NAD Heart: RRR, no murmur Lungs: CTA anteriorly Abdomen: Soft, non-distended, no gaurding Extremities: trace pedal edema b/l lower extremities Dialysis Access: [facility] accessed  Additional Objective Labs: Basic Metabolic Panel: Recent Labs  Lab 11/09/20 1728 11/10/20 0227 11/10/20 0916 11/13/20 1043  NA 138 140 139 128*  128*  K 3.9 3.8 3.8 3.1*  3.0*  CL 101  --  104 96*  95*  CO2 19*  --  25 24  23   GLUCOSE 172*  --  96 123*  125*  BUN 8  --  14 9  9   CREATININE 3.03*  --  3.84* 3.79*  3.79*  CALCIUM 7.6*  --  8.1* 8.0*  7.9*  PHOS  --   --   --  2.2*  2.2*   Liver Function Tests: Recent Labs  Lab 11/09/20 1728 11/13/20 1043  AST 26  --   ALT 12  --   ALKPHOS 94  --   BILITOT 1.0  --   PROT 5.6*  --   ALBUMIN 2.3* 1.9*   No results for input(s): LIPASE, AMYLASE in the last 168 hours. CBC: Recent Labs  Lab 11/09/20 1728 11/10/20 0227 11/10/20 0249 11/11/20 0500 11/12/20 0618 11/13/20 1100  WBC 13.0*  --  12.5* 6.2 7.0 7.4  NEUTROABS 11.2*  --   --   --   --   --   HGB 9.9*   < > 9.0* 7.0* 8.3* 8.4*  HCT 31.1*   < > 28.4* 21.4* 24.7* 25.0*  MCV 74.0*  --  74.5* 72.8* 74.0* 73.7*  PLT 183  --  146* 107* 89* 90*   < > = values in this interval not displayed.   Blood Culture    Component Value Date/Time   SDES BLOOD LEFT ANTECUBITAL 11/09/2020 1734    SPECREQUEST  11/09/2020 1734    BOTTLES DRAWN AEROBIC AND ANAEROBIC Blood Culture adequate volume   CULT  11/09/2020 1734    NO GROWTH 4 DAYS Performed at [facility], [place] [address], [place], [place] [address]    REPTSTATUS PENDING 11/09/2020 1734    Cardiac Enzymes: No results for input(s): CKTOTAL, CKMB, CKMBINDEX, TROPONINI in the last 168 hours. CBG: Recent Labs  Lab 11/13/20 0442 11/13/20 0735 11/13/20 1155 11/13/20 1740 11/13/20 2126  GLUCAP 117* 114* 108* 154* 167*   Iron Studies: No results for input(s): IRON, TIBC, TRANSFERRIN, FERRITIN in the last 72 hours. @lablastinr3 @ Studies/Results: DG CHEST PORT 1 VIEW  Result Date: 11/12/2020 CLINICAL DATA:  Obstruction of central line. EXAM: PORTABLE CHEST 1 VIEW COMPARISON:  Chest x-ray 11/09/2020 FINDINGS: Interval placement of a left sided approach central venous catheter with tip overlying the expected region of the superior caval junction. Right chest wall  dialysis catheter with tip overlying the right atrium likely in the region of the inferior caval junction. The heart and mediastinal contours are within normal limits. Persistently elevated right hemidiaphragm. No focal consolidation. No pulmonary edema. No pleural effusion. No pneumothorax. No acute osseous abnormality. IMPRESSION: 1. Interval placement of a left sided approach central venous catheter with tip overlying the expected region of the superior caval junction. 2. Right chest wall dialysis catheter with tip overlying the right atrium likely in the region of the inferior caval junction. 3. No acute cardiopulmonary abnormality. Electronically Signed   By: Iven Finn M.D.   On: 11/12/2020 22:26   EEG adult  Result Date: 11/12/2020 Lora Havens, MD     11/12/2020  4:46 PM Patient Name: Joann Gutierrez MRN: [id number] Epilepsy Attending: Lora Havens Referring Physician/Provider: Dr Derrick Ravel Date: 11/12/2020 Duration: 22.22 mins Patient  history: 75yo F with syncope. EEG to evaluate for seizure Level of alertness: Awake AEDs during EEG study: None Technical aspects: This EEG study was done with scalp electrodes positioned according to the 10-20 International system of electrode placement. Electrical activity was acquired at a sampling rate of 500Hz  and reviewed with a high frequency filter of 70Hz  and a low frequency filter of 1Hz . EEG data were recorded continuously and digitally stored. Description: The posterior dominant rhythm consists of 9 Hz activity of moderate voltage (25-35 uV) seen predominantly in posterior head regions, symmetric and reactive to eye opening and eye closing. EEG showed intermittent generalized 3-6hz  theta-delta slowing.  Hyperventilation and photic stimulation were not performed.   ABNORMALITY - Intermittent slow, generalized IMPRESSION: This study is suggestive of mild diffuse encephalopathy, non specific etiology. No seizures or epileptiform discharges were seen throughout the recording. [name]   Medications:  sodium chloride      sodium chloride   Intravenous Once   apixaban  10 mg Oral BID   Followed by   Derrill Memo ON 11/20/2020] apixaban  5 mg Oral BID   Chlorhexidine Gluconate Cloth  6 each Topical Q0600   darbepoetin (ARANESP) injection - DIALYSIS  200 mcg Intravenous Q Tue-HD   feeding supplement (NEPRO CARB STEADY)  237 mL Oral Q24H   mouth rinse  15 mL Mouth Rinse BID   multivitamin  1 tablet Oral QHS   sodium chloride flush  10-40 mL Intracatheter Q12H   thiamine injection  100 mg Intravenous Daily    Dialysis Orders: Center: [facility]  on TTS . 180NRe, 4 hours, BFR 400, DFR 500, EDW 77kg 4K, 2.25Ca, TDC Heparin 6000 unit bolus Mircera 261mcg IV q 2 weeks- last dose 10/31/20 Hectorol 52mcg IV q HD  Assessment/Plan: Syncope: Hypotensive on arrival despite no UF with HD, given 1.5L IVF total. CTA showed PE. LE US performed (+) DVT left common femoral vein. Continue Heparin Gtt. ECHO  completed 10/22. PE: heparin ordered, management per primary team  ESRD:  TTS schedule  Plan for HD 10/25 per her usual schedule.  Hypertension/volume: Hypotensive on arrival, given 1.5L UF and now appears close to euvolemic, no edema on CT but she does have trace edema. Blood pressure improved. She is 1.7kg above her EDW.   Anemia: S/p 1 unit PRBC 10/22 for Hgb 7-now 8.4 and stable. Monitor trend. Pr was on max ESA outpatient and last dose was on 10/11-Will order ESA today with HD. Give blood transfusion as needed.  Metabolic bone disease: Calcium controlled, phos low. Hold binders, continue to monitor phos and will supplement PRN.  Anice Paganini, PA-C 11/14/2020, 8:06 AM  [facility] Pager: [phone]  Nephrology attending: Patient was seen and examined at dialysis.  Chart reviewed.  I agree with above. ESRD on HD getting dialysis per TTS schedule.  Encourage oral intake as she has low phosphorus level.  Not on binders.  Katheran James, MD [facility].

## 2020-11-14 NOTE — Progress Notes (Signed)
Patient's sister [name] is at the bedside.  Patient's sister inquired about the results of the EEG, and she wanted to know if the MD could prescribe some medication to help increase the patient's po intake.  I told the patient's sister that I would leave a note for the MD to notify her.  Will continue to monitor.

## 2020-11-14 NOTE — Progress Notes (Signed)
PT Cancellation Note  Patient Details Name: Joann Gutierrez MRN: [id number] DOB: 04-Mar-1945   Cancelled Treatment:    Reason Eval/Treat Not Completed: Other (comment); attempted PT eval after pt back from dialysis, OT reports pt not following commands.  Unable to engage pt or encourage participation in mobility.  May do better earlier in the day so will attempt again another day.   Reginia Naas 11/14/2020, 4:56 PM Magda Kiel, PT Acute Rehabilitation Services [id number]:[phone] [phone] 11/14/2020

## 2020-11-14 NOTE — Discharge Instructions (Addendum)
Information on my medicine - ELIQUIS (apixaban)   Why was Eliquis prescribed for you? Eliquis was prescribed to treat blood clots that may have been found in the veins of your legs (deep vein thrombosis) or in your lungs (pulmonary embolism) and to reduce the risk of them occurring again.  What do You need to know about Eliquis ? The starting dose is 10 mg (two 5 mg tablets) taken TWICE daily for the FIRST SEVEN (7) DAYS, then on 10/31  the dose is reduced to ONE 5 mg tablet taken TWICE daily.  Eliquis may be taken with or without food.   Try to take the dose about the same time in the morning and in the evening. If you have difficulty swallowing the tablet whole please discuss with your pharmacist how to take the medication safely.  Take Eliquis exactly as prescribed and DO NOT stop taking Eliquis without talking to the doctor who prescribed the medication.  Stopping may increase your risk of developing a new blood clot.  Refill your prescription before you run out.  After discharge, you should have regular check-up appointments with your healthcare provider that is prescribing your Eliquis.    What do you do if you miss a dose? If a dose of ELIQUIS is not taken at the scheduled time, take it as soon as possible on the same day and twice-daily administration should be resumed. The dose should not be doubled to make up for a missed dose.  Important Safety Information A possible side effect of Eliquis is bleeding. You should call your healthcare provider right away if you experience any of the following: Bleeding from an injury or your nose that does not stop. Unusual colored urine (red or dark brown) or unusual colored stools (red or black). Unusual bruising for unknown reasons. A serious fall or if you hit your head (even if there is no bleeding).  Some medicines may interact with Eliquis and might increase your risk of bleeding or clotting while on Eliquis. To help avoid this,  consult your healthcare provider or pharmacist prior to using any new prescription or non-prescription medications, including herbals, vitamins, non-steroidal anti-inflammatory drugs (NSAIDs) and supplements.  This website has more information on Eliquis (apixaban): http://www.eliquis.com/eliquis/home

## 2020-11-14 NOTE — Progress Notes (Signed)
Dr. Tyrell Antonio updated with CBG at 25, able to convince pt to drink nepro, CBG recheck at 72. More Nepro given. Will rechecked CBG . Pt able to take eliquis 10 mg at this time after a lot of convincing. Per MD order, will pass along to incoming shift about need to call Pharmacy if pt refuses Eliquis, instruction also left at [place] card.

## 2020-11-14 NOTE — Progress Notes (Signed)
PROGRESS NOTE    Joann Gutierrez  VWP:[id number] DOB: 10-Nov-1945 DOA: 11/09/2020 PCP: Andree Moro, DO   Brief Narrative: 75 year old with past medical history significant for ESRD on hemodialysis TTS, anemia, asthma, CAD, chronic diastolic heart failure, pulmonary hypertension, COPD advanced dementia, chronic ambulatory dysfunction, depression, type 2 diabetes, seizure, chronic respiratory failure on home oxygen, OSA on CPAP, CVA presents to the ED via EMS from her hemodialysis center after she completed full treatment, no fluid was removed.  After patient completed treatment she had a witnessed syncopal episode with seizure-like activity.  Brief episode of apnea, chest compressions started after which patient wake up.  Hypoglycemic CBG 53.  She was also hypotensive 53/27 when EMS arrived.  She received IV bolus.  In the ED blood pressure was 82/54.  She received fever IV fluids.  Patient has had poor oral intake on and off worse over the last 2 days.  Subsequently blood pressure improved after IV fluids in the ED.    Assessment & Plan:   Principal Problem:   Syncope Active Problems:   Acute on chronic respiratory failure (HCC)   Severe sepsis (HCC)   Hypotension   Hypoglycemia   Pressure injury of skin   1-Acute pulmonary embolism and left upper lobe, No right side heart strain per CT -Patient initially presented with worsening hypoxemia and hypotension. -Currently her blood pressure is stable 130s range. -Troponin not significantly elevated at 25 and 27. -She has been started on IV heparin drip, will continue -Doppler lower extremity positive for bilateral extensive DVT.  -Patient stable on 3 L. -Echo normal right ventricular function.  -Appreciate CCM evaluation, recommend tx with heparin/anticoagulation.  -Patient was transition to Eliquis 10/24. Discussed with nursing staff, if patient refuse eliquis, MD will need to be inform, patient might need to be place on heparin gtt.    2-SIRS: Sepsis ruled out.  Patient presented with hypotension, tachycardia, lactic acidosis with lactic acid at 8, leukocytosis Related to hypotension, hypoperfusion, from PE. Completed  Cefepime for 5 days.  Blood cultures: No growth to date.  CT chest Negative for pneumonia, CT abdomen no evidence of infectious process. Pro-calcitonin elevated.  Discontinue vancomycin and flagyl 10/22.   3-Acute on chronic hypoxic respiratory failure: He required 5 L of oxygen on admission.  Currently on 3 L.  She is also on chronic oxygen at home. Likely related to PE.  Stable   4-Recurrent Hypoglycemia: Type 2 diabetes: Not on insulin or hypoglycemic agent. Poor oral intake. Per family information.  Plan to hold D 10 IV fluids, monitor CBG off fluid.  Poor oral intake, caloric count, palliative care consulted.  She ate better yesterday. Continue to monitor.   5-Mild troponin elevation: Likely demand, PE, mildly elevation  6-Anemia; hb down to 7. Received one unit PRBC. Monitor hb.  Hb stable today at 8.   ESRD on hemodialysis TTS: Nephrology consulted , HD 10/22. //10/25 Syncope; related to hypoglycemia, hypotension. Per CCM less likely relate to PE>  Chronic diastolic heart failure: Volume management with dialysis COPD: Continue with DuoNeb OSA: Continue with CPAP Dementia: Delirium: Patient gets agitated at times and sometimes hits staff Delirium precaution. She has not been talking.  CT head on admission negative.  EEG negative for seizure.   Questionable seizure: Likely related to hypoperfusion rather than real seizure EEG Ordered.   Estimated body mass index is 29.48 kg/m as calculated from the following:   Height as of this encounter: 5\' 4"  (1.626 m).   Weight  as of this encounter: 77.9 kg.   DVT prophylaxis: Heparin drip Code Status: Full code Family Communication: sister who is POA 10/23, left message to sister 10/25.  Disposition Plan:  Status is:  Inpatient  Remains inpatient appropriate because: Monitor oral intake, CBG for hypoglycemia. Might need SNF        Consultants:  Nephrology  Procedures:  Echo Doppler lower extremity  Antimicrobials:    Subjective: She is alert, didn't want to answer questions. I saw patient in HD>   Objective: Vitals:   11/14/20 1100 11/14/20 1110 11/14/20 1128 11/14/20 1420  BP: (!) 104/45 (!) 100/45  (!) 115/53  Pulse:    99  Resp:  16  16  Temp:  97.7 F (36.5 C)  99.3 F (37.4 C)  TempSrc:  Axillary  Axillary  SpO2:  95% 95% 100%  Weight:   77.9 kg   Height:        Intake/Output Summary (Last 24 hours) at 11/14/2020 1438 Last data filed at 11/14/2020 1110 Gross per 24 hour  Intake --  Output 859 ml  Net -859 ml    Filed Weights   11/11/20 1220 11/14/20 0703 11/14/20 1128  Weight: 98.5 kg 78.7 kg 77.9 kg    Examination:  General exam: NAD Respiratory system: CTA Cardiovascular system: s 1, S  2 RRR Gastrointestinal system: BS present, soft, nt Central nervous system: Alert Extremities: trace edema  Data Reviewed: I have personally reviewed following labs and imaging studies  CBC: Recent Labs  Lab 11/09/20 1728 11/10/20 0227 11/10/20 0249 11/11/20 0500 11/12/20 0618 11/13/20 1100 11/14/20 0751  WBC 13.0*  --  12.5* 6.2 7.0 7.4 5.8  NEUTROABS 11.2*  --   --   --   --   --   --   HGB 9.9*   < > 9.0* 7.0* 8.3* 8.4* 8.3*  HCT 31.1*   < > 28.4* 21.4* 24.7* 25.0* 25.4*  MCV 74.0*  --  74.5* 72.8* 74.0* 73.7* 75.6*  PLT 183  --  146* 107* 89* 90* 98*   < > = values in this interval not displayed.    Basic Metabolic Panel: Recent Labs  Lab 11/09/20 1728 11/10/20 0227 11/10/20 0916 11/13/20 1043 11/14/20 0751  NA 138 140 139 128*  128* 131*  K 3.9 3.8 3.8 3.1*  3.0* 3.7  CL 101  --  104 96*  95* 99  CO2 19*  --  25 24  23 25   GLUCOSE 172*  --  96 123*  125* 113*  BUN 8  --  14 9  9 8   CREATININE 3.03*  --  3.84* 3.79*  3.79* 2.78*   CALCIUM 7.6*  --  8.1* 8.0*  7.9* 7.6*  PHOS  --   --   --  2.2*  2.2* 1.6*    GFR: Estimated Creatinine Clearance: 17.7 mL/min (A) (by C-G formula based on SCr of 2.78 mg/dL (H)). Liver Function Tests: Recent Labs  Lab 11/09/20 1728 11/13/20 1043 11/14/20 0751  AST 26  --   --   ALT 12  --   --   ALKPHOS 94  --   --   BILITOT 1.0  --   --   PROT 5.6*  --   --   ALBUMIN 2.3* 1.9* 1.9*    No results for input(s): LIPASE, AMYLASE in the last 168 hours. Recent Labs  Lab 11/10/20 0916  AMMONIA 11    Coagulation Profile: Recent Labs  Lab 11/10/20  1912  INR 1.7*    Cardiac Enzymes: No results for input(s): CKTOTAL, CKMB, CKMBINDEX, TROPONINI in the last 168 hours. BNP (last 3 results) No results for input(s): PROBNP in the last 8760 hours. HbA1C: No results for input(s): HGBA1C in the last 72 hours.  CBG: Recent Labs  Lab 11/13/20 1155 11/13/20 1740 11/13/20 2126 11/14/20 1201 11/14/20 1408  GLUCAP 108* 154* 167* 67* 72    Lipid Profile: No results for input(s): CHOL, HDL, LDLCALC, TRIG, CHOLHDL, LDLDIRECT in the last 72 hours. Thyroid Function Tests: No results for input(s): TSH, T4TOTAL, FREET4, T3FREE, THYROIDAB in the last 72 hours. Anemia Panel: No results for input(s): VITAMINB12, FOLATE, FERRITIN, TIBC, IRON, RETICCTPCT in the last 72 hours.  Sepsis Labs: Recent Labs  Lab 11/09/20 1729 11/09/20 1955 11/10/20 0249  PROCALCITON  --   --  8.79  LATICACIDVEN 8.4* 6.9* 1.4     Recent Results (from the past 240 hour(s))  Blood culture (routine x 2)     Status: None   Collection Time: 11/09/20  5:34 PM   Specimen: BLOOD  Result Value Ref Range Status   Specimen Description BLOOD LEFT ANTECUBITAL  Final   Special Requests   Final    BOTTLES DRAWN AEROBIC AND ANAEROBIC Blood Culture adequate volume   Culture   Final    NO GROWTH 5 DAYS Performed at [facility], [address] [place], [place] [address]    Report Status 11/14/2020  FINAL  Final  Resp Panel by RT-PCR (Flu A&B, Covid) Nasopharyngeal Swab     Status: None   Collection Time: 11/09/20  7:24 PM   Specimen: Nasopharyngeal Swab; Nasopharyngeal(NP) swabs in vial transport medium  Result Value Ref Range Status   SARS Coronavirus 2 by RT PCR NEGATIVE NEGATIVE Final    Comment: (NOTE) SARS-CoV-2 target nucleic acids are NOT DETECTED.  The SARS-CoV-2 RNA is generally detectable in upper respiratory specimens during the acute phase of infection. The lowest concentration of SARS-CoV-2 viral copies this assay can detect is 138 copies/mL. A negative result does not preclude SARS-Cov-2 infection and should not be used as the sole basis for treatment or other patient management decisions. A negative result may occur with  improper specimen collection/handling, submission of specimen other than nasopharyngeal swab, presence of viral mutation(s) within the areas targeted by this assay, and inadequate number of viral copies(<138 copies/mL). A negative result must be combined with clinical observations, patient history, and epidemiological information. The expected result is Negative.  Fact Sheet for Patients:  EntrepreneurPulse.com.au  Fact Sheet for Healthcare Providers:  IncredibleEmployment.be  This test is no t yet approved or cleared by the [place] FDA and  has been authorized for detection and/or diagnosis of SARS-CoV-2 by FDA under an Emergency Use Authorization (EUA). This EUA will remain  in effect (meaning this test can be used) for the duration of the COVID-19 declaration under Section 564(b)(1) of the Act, 21 U.S.C.section 360bbb-3(b)(1), unless the authorization is terminated  or revoked sooner.       Influenza A by PCR NEGATIVE NEGATIVE Final   Influenza B by PCR NEGATIVE NEGATIVE Final    Comment: (NOTE) The Xpert Xpress SARS-CoV-2/FLU/RSV plus assay is intended as an aid in the diagnosis of influenza  from Nasopharyngeal swab specimens and should not be used as a sole basis for treatment. Nasal washings and aspirates are unacceptable for Xpert Xpress SARS-CoV-2/FLU/RSV testing.  Fact Sheet for Patients: EntrepreneurPulse.com.au  Fact Sheet for Healthcare Providers: IncredibleEmployment.be  This test is not yet approved or cleared by the [place] and has been authorized for detection and/or diagnosis of SARS-CoV-2 by FDA under an Emergency Use Authorization (EUA). This EUA will remain in effect (meaning this test can be used) for the duration of the COVID-19 declaration under Section 564(b)(1) of the Act, 21 U.S.C. section 360bbb-3(b)(1), unless the authorization is terminated or revoked.  Performed at [facility], [place] [address] [place], [place] [address]           Radiology Studies: DG CHEST PORT 1 VIEW  Result Date: 11/12/2020 CLINICAL DATA:  Obstruction of central line. EXAM: PORTABLE CHEST 1 VIEW COMPARISON:  Chest x-ray 11/09/2020 FINDINGS: Interval placement of a left sided approach central venous catheter with tip overlying the expected region of the superior caval junction. Right chest wall dialysis catheter with tip overlying the right atrium likely in the region of the inferior caval junction. The heart and mediastinal contours are within normal limits. Persistently elevated right hemidiaphragm. No focal consolidation. No pulmonary edema. No pleural effusion. No pneumothorax. No acute osseous abnormality. IMPRESSION: 1. Interval placement of a left sided approach central venous catheter with tip overlying the expected region of the superior caval junction. 2. Right chest wall dialysis catheter with tip overlying the right atrium likely in the region of the inferior caval junction. 3. No acute cardiopulmonary abnormality. Electronically Signed   By: Iven Finn M.D.   On: 11/12/2020 22:26   EEG adult  Result  Date: 11/12/2020 Lora Havens, MD     11/12/2020  4:46 PM Patient Name: Joann Gutierrez MRN: [id number] Epilepsy Attending: Lora Havens Referring Physician/Provider: Dr Derrick Ravel Date: 11/12/2020 Duration: 22.22 mins Patient history: 75yo F with syncope. EEG to evaluate for seizure Level of alertness: Awake AEDs during EEG study: None Technical aspects: This EEG study was done with scalp electrodes positioned according to the 10-20 International system of electrode placement. Electrical activity was acquired at a sampling rate of 500Hz  and reviewed with a high frequency filter of 70Hz  and a low frequency filter of 1Hz . EEG data were recorded continuously and digitally stored. Description: The posterior dominant rhythm consists of 9 Hz activity of moderate voltage (25-35 uV) seen predominantly in posterior head regions, symmetric and reactive to eye opening and eye closing. EEG showed intermittent generalized 3-6hz  theta-delta slowing.  Hyperventilation and photic stimulation were not performed.   ABNORMALITY - Intermittent slow, generalized IMPRESSION: This study is suggestive of mild diffuse encephalopathy, non specific etiology. No seizures or epileptiform discharges were seen throughout the recording. [name]        Scheduled Meds:  sodium chloride   Intravenous Once   apixaban  10 mg Oral BID   Followed by   Derrill Memo ON 11/20/2020] apixaban  5 mg Oral BID   Chlorhexidine Gluconate Cloth  6 each Topical Q0600   darbepoetin (ARANESP) injection - DIALYSIS  200 mcg Intravenous Q Tue-HD   feeding supplement (NEPRO CARB STEADY)  237 mL Oral Q24H   heparin sodium (porcine)       mouth rinse  15 mL Mouth Rinse BID   multivitamin  1 tablet Oral QHS   sodium chloride flush  10-40 mL Intracatheter Q12H   thiamine injection  100 mg Intravenous Daily   Continuous Infusions:  sodium chloride       LOS: 5 days    Time spent: 35 minutes.     [name],  MD [facility]  If 7PM-7AM, please contact night-coverage [email or web address]  11/14/2020, 2:38 PM

## 2020-11-14 NOTE — Progress Notes (Signed)
RN aware

## 2020-11-14 NOTE — Progress Notes (Signed)
Patient would not open her mouth to take any of her night time medication.  I tried several times and patient still would not take her medications.  Will continue to follow.   Rico Sheehan, RN

## 2020-11-15 DIAGNOSIS — I2699 Other pulmonary embolism without acute cor pulmonale: Principal | ICD-10-CM

## 2020-11-15 DIAGNOSIS — N186 End stage renal disease: Secondary | ICD-10-CM

## 2020-11-15 DIAGNOSIS — Z7189 Other specified counseling: Secondary | ICD-10-CM | POA: Diagnosis not present

## 2020-11-15 DIAGNOSIS — Z515 Encounter for palliative care: Secondary | ICD-10-CM

## 2020-11-15 DIAGNOSIS — R4182 Altered mental status, unspecified: Secondary | ICD-10-CM | POA: Diagnosis not present

## 2020-11-15 LAB — GLUCOSE, CAPILLARY
Glucose-Capillary: 147 mg/dL — ABNORMAL HIGH (ref 70–99)
Glucose-Capillary: 127 mg/dL — ABNORMAL HIGH (ref 70–99)
Glucose-Capillary: 106 mg/dL — ABNORMAL HIGH (ref 70–99)
Glucose-Capillary: 101 mg/dL — ABNORMAL HIGH (ref 70–99)
Glucose-Capillary: 127 mg/dL — ABNORMAL HIGH (ref 70–99)

## 2020-11-15 MED ORDER — HEPARIN (PORCINE) 25000 UT/250ML-% IV SOLN
600.0000 [IU]/h | INTRAVENOUS | Status: DC
  Administered 2020-11-15: 650 [IU]/h via INTRAVENOUS
  Administered 2020-11-17: 500 [IU]/h via INTRAVENOUS
  Filled 2020-11-15 (×2): qty 250

## 2020-11-15 MED ORDER — CHLORHEXIDINE GLUCONATE CLOTH 2 % EX PADS
6.0000 | MEDICATED_PAD | Freq: Every day | CUTANEOUS | Status: DC
  Administered 2020-11-16 – 2020-11-17 (×2): 6 via TOPICAL

## 2020-11-15 MED ORDER — K PHOS MONO-SOD PHOS DI & MONO 155-852-130 MG PO TABS
250.0000 mg | ORAL_TABLET | Freq: Every day | ORAL | Status: AC
  Filled 2020-11-15: qty 1

## 2020-11-15 NOTE — Progress Notes (Signed)
PROGRESS NOTE    Joann Gutierrez  AST:[id number] DOB: Jun 29, 1945 DOA: 11/09/2020  PCP: Andree Moro, DO    Brief Narrative: 75 year old with past medical history significant for ESRD on hemodialysis TTS, anemia, asthma, CAD, chronic diastolic heart failure, pulmonary hypertension, COPD advanced dementia, chronic ambulatory dysfunction, depression, type 2 diabetes, seizure, chronic respiratory failure on home oxygen, OSA on CPAP, CVA presented to the ED via EMS from her hemodialysis center after she completed full treatment, no fluid was removed.  After patient completed treatment she had a witnessed syncopal episode with seizure-like activity.  Brief episode of apnea, chest compressions started after which patient woke up.  Hypoglycemic CBG 53.  She was also hypotensive 53/27 when EMS arrived.  She received IV bolus.  In the ED blood pressure was 82/54.  Patient received IV fluids.  However patient has had very poor oral intake and decreased mentation over the last several days.  Palliative care was consulted.    Assessment & Plan:   Acute pulmonary embolism and left upper lobe, No right side heart strain per CT -Patient initially presented with worsening hypoxemia and hypotension. -Troponin not significantly elevated at 25 and 27. -Patient was started on IV heparin.  Seen by critical care medicine.  Echocardiogram showed normal right ventricular function.  Transitioned to Eliquis on 10/24. However it looks like patient has not receiving this medication.  Palliative care is supposed to meet with the family on Friday.  Till then we will transition her back to heparin infusion  SIRS: Sepsis ruled out.  Patient presented with hypotension, tachycardia, lactic acidosis with lactic acid at 8, leukocytosis. Related to hypotension, hypoperfusion, from PE. Completed Cefepime for 5 days.  Blood cultures: No growth to date.  CT chest Negative for pneumonia, CT abdomen no evidence of infectious  process. Pro-calcitonin elevated.  Discontinue vancomycin and flagyl 10/22.  Acute on chronic hypoxic respiratory failure: Initially required oxygen presumably due to PE.  Now on room air.    Recurrent Hypoglycemia: Type 2 diabetes Not on insulin or hypoglycemic agent. Poor oral intake.  Required D10 infusion briefly.  Currently off of IV fluids CBGs are noted to be stable today.  Did have episode of hypoglycemia yesterday afternoon.  Mild troponin elevation Likely demand, PE, mildly elevation  Microcytic Anemia Patient did require 1 unit of PRBC for hemoglobin of 7.0.  No evidence of overt bleeding noted.  ESRD on hemodialysis TTS Nephrology following.  Chronic diastolic heart failure: Volume management with dialysis COPD: Continue with DuoNeb OSA: Continue with CPAP  Dementia: Delirium: Patient gets agitated at times and sometimes hits staff Delirium precaution. She has not been talking.  CT head on admission negative.  EEG negative for seizure.   Questionable seizure: Likely related to hypoperfusion rather than real seizure EEG Ordered.  Goals of care Patient appears to be declining.  Oral intake has been very poor.  She also has advanced dementia which is contributing for the most part.  Prognosis appears to be poor.  Palliative care is following.  Family meeting this Friday.  DVT prophylaxis: Heparin drip Code Status: Full code Family Communication: sister who is POA 10/23, left message to sister 10/25.  Disposition Plan: Unclear for now.  Status is: Inpatient  Remains inpatient appropriate because: Monitor oral intake, CBG for hypoglycemia. Might need SNF     Consultants:  Nephrology Palliative care  Procedures:  Echo Doppler lower extremity  Antimicrobials:    Subjective: Patient is not really responsive this morning.  Opens  her eyes but does not communicate.    Objective: Vitals:   11/14/20 1420 11/14/20 2053 11/14/20 2354 11/15/20 0407  BP:  (!) 115/53 (!) 106/57 113/61 116/61  Pulse: 99 97 98 84  Resp: 16 20 17 15   Temp: 99.3 F (37.4 C) 98.9 F (37.2 C) 99 F (37.2 C) 99 F (37.2 C)  TempSrc: Axillary Axillary Axillary Axillary  SpO2: 100% 100% 100% 95%  Weight:      Height:        Intake/Output Summary (Last 24 hours) at 11/15/2020 1215 Last data filed at 11/15/2020 0900 Gross per 24 hour  Intake 0 ml  Output --  Net 0 ml    Filed Weights   11/11/20 1220 11/14/20 0703 11/14/20 1128  Weight: 98.5 kg 78.7 kg 77.9 kg    Examination:  General appearance: Not very responsive Resp: Clear to auscultation bilaterally.  Normal effort Cardio: S1-S2 is normal regular.  No S3-S4.  No rubs murmurs or bruit GI: Abdomen is soft.  Nontender nondistended.  Bowel sounds are present normal.  No masses organomegaly Extremities: Mild edema bilateral lower extremity Neurologic:   No focal neurological deficits.     Data Reviewed: I have personally reviewed following labs and imaging studies  CBC: Recent Labs  Lab 11/09/20 1728 11/10/20 0227 11/10/20 0249 11/11/20 0500 11/12/20 0618 11/13/20 1100 11/14/20 0751  WBC 13.0*  --  12.5* 6.2 7.0 7.4 5.8  NEUTROABS 11.2*  --   --   --   --   --   --   HGB 9.9*   < > 9.0* 7.0* 8.3* 8.4* 8.3*  HCT 31.1*   < > 28.4* 21.4* 24.7* 25.0* 25.4*  MCV 74.0*  --  74.5* 72.8* 74.0* 73.7* 75.6*  PLT 183  --  146* 107* 89* 90* 98*   < > = values in this interval not displayed.    Basic Metabolic Panel: Recent Labs  Lab 11/09/20 1728 11/10/20 0227 11/10/20 0916 11/13/20 1043 11/14/20 0751  NA 138 140 139 128*  128* 131*  K 3.9 3.8 3.8 3.1*  3.0* 3.7  CL 101  --  104 96*  95* 99  CO2 19*  --  25 24  23 25   GLUCOSE 172*  --  96 123*  125* 113*  BUN 8  --  14 9  9 8   CREATININE 3.03*  --  3.84* 3.79*  3.79* 2.78*  CALCIUM 7.6*  --  8.1* 8.0*  7.9* 7.6*  PHOS  --   --   --  2.2*  2.2* 1.6*    GFR: Estimated Creatinine Clearance: 17.7 mL/min (A) (by C-G formula  based on SCr of 2.78 mg/dL (H)). Liver Function Tests: Recent Labs  Lab 11/09/20 1728 11/13/20 1043 11/14/20 0751  AST 26  --   --   ALT 12  --   --   ALKPHOS 94  --   --   BILITOT 1.0  --   --   PROT 5.6*  --   --   ALBUMIN 2.3* 1.9* 1.9*    Recent Labs  Lab 11/10/20 0916  AMMONIA 11    Coagulation Profile: Recent Labs  Lab 11/10/20 1912  INR 1.7*     CBG: Recent Labs  Lab 11/14/20 1626 11/14/20 2058 11/15/20 0412 11/15/20 0708 11/15/20 1116  GLUCAP 211* 212* 147* 127* 106*    Sepsis Labs: Recent Labs  Lab 11/09/20 1729 11/09/20 1955 11/10/20 0249  PROCALCITON  --   --  8.79  LATICACIDVEN 8.4* 6.9* 1.4     Recent Results (from the past 240 hour(s))  Blood culture (routine x 2)     Status: None   Collection Time: 11/09/20  5:34 PM   Specimen: BLOOD  Result Value Ref Range Status   Specimen Description BLOOD LEFT ANTECUBITAL  Final   Special Requests   Final    BOTTLES DRAWN AEROBIC AND ANAEROBIC Blood Culture adequate volume   Culture   Final    NO GROWTH 5 DAYS Performed at [facility], [address], [place], [place] [address]    Report Status 11/14/2020 FINAL  Final  Resp Panel by RT-PCR (Flu A&B, Covid) Nasopharyngeal Swab     Status: None   Collection Time: 11/09/20  7:24 PM   Specimen: Nasopharyngeal Swab; Nasopharyngeal(NP) swabs in vial transport medium  Result Value Ref Range Status   SARS Coronavirus 2 by RT PCR NEGATIVE NEGATIVE Final    Comment: (NOTE) SARS-CoV-2 target nucleic acids are NOT DETECTED.  The SARS-CoV-2 RNA is generally detectable in upper respiratory specimens during the acute phase of infection. The lowest concentration of SARS-CoV-2 viral copies this assay can detect is 138 copies/mL. A negative result does not preclude SARS-Cov-2 infection and should not be used as the sole basis for treatment or other patient management decisions. A negative result may occur with  improper specimen  collection/handling, submission of specimen other than nasopharyngeal swab, presence of viral mutation(s) within the areas targeted by this assay, and inadequate number of viral copies(<138 copies/mL). A negative result must be combined with clinical observations, patient history, and epidemiological information. The expected result is Negative.  Fact Sheet for Patients:  EntrepreneurPulse.com.au  Fact Sheet for Healthcare Providers:  IncredibleEmployment.be  This test is no t yet approved or cleared by the [place] FDA and  has been authorized for detection and/or diagnosis of SARS-CoV-2 by FDA under an Emergency Use Authorization (EUA). This EUA will remain  in effect (meaning this test can be used) for the duration of the COVID-19 declaration under Section 564(b)(1) of the Act, 21 U.S.C.section 360bbb-3(b)(1), unless the authorization is terminated  or revoked sooner.       Influenza A by PCR NEGATIVE NEGATIVE Final   Influenza B by PCR NEGATIVE NEGATIVE Final    Comment: (NOTE) The Xpert Xpress SARS-CoV-2/FLU/RSV plus assay is intended as an aid in the diagnosis of influenza from Nasopharyngeal swab specimens and should not be used as a sole basis for treatment. Nasal washings and aspirates are unacceptable for Xpert Xpress SARS-CoV-2/FLU/RSV testing.  Fact Sheet for Patients: EntrepreneurPulse.com.au  Fact Sheet for Healthcare Providers: IncredibleEmployment.be  This test is not yet approved or cleared by the [place] FDA and has been authorized for detection and/or diagnosis of SARS-CoV-2 by FDA under an Emergency Use Authorization (EUA). This EUA will remain in effect (meaning this test can be used) for the duration of the COVID-19 declaration under Section 564(b)(1) of the Act, 21 U.S.C. section 360bbb-3(b)(1), unless the authorization is terminated or revoked.  Performed at [facility], [place] [address] [place],  [address]           Radiology Studies: No results found.    Scheduled Meds:  sodium chloride   Intravenous Once   apixaban  10 mg Oral BID   Followed by   Derrill Memo ON 11/20/2020] apixaban  5 mg Oral BID   Chlorhexidine Gluconate Cloth  6 each Topical Q0600   [START ON  11/16/2020] Chlorhexidine Gluconate Cloth  6 each Topical Q0600   darbepoetin (ARANESP) injection - DIALYSIS  200 mcg Intravenous Q Tue-HD   feeding supplement (NEPRO CARB STEADY)  237 mL Oral TID BM   mouth rinse  15 mL Mouth Rinse BID   multivitamin  1 tablet Oral QHS   phosphorus  250 mg Oral Daily   sodium chloride flush  10-40 mL Intracatheter Q12H   thiamine injection  100 mg Intravenous Daily   Continuous Infusions:  sodium chloride       LOS: 6 days     Bonnielee Haff, MD [facility]   If 7PM-7AM, please contact night-coverage [email or web address]  11/15/2020, 12:15 PM

## 2020-11-15 NOTE — Progress Notes (Signed)
Patient confused. Has cup in  hand, appears to be spitting. Patient is not verbal with RT. Per RN she is altered. CPAP held at this time. Patient on RA. Sp02 100%

## 2020-11-15 NOTE — Progress Notes (Signed)
Joann Gutierrez from [facility].was made aware that the pt.refused to take her Eliquis & said will try to give again her am dose  when she is fully awake.

## 2020-11-15 NOTE — Progress Notes (Signed)
ANTICOAGULATION CONSULT NOTE - Follow-up Consult  Pharmacy Consult for apixaban>heparin Indication: pulmonary embolus  Allergies  Allergen Reactions   Morphine And Related Other (See Comments)    Family request not to be given, reports pt does not wake up when given    Promethazine Hcl Other (See Comments)    REACTION: lethargy    Patient Measurements: Height: 5\' 4"  (162.6 cm) Weight: 77.9 kg (171 lb 11.8 oz) IBW/kg (Calculated) : 54.7 Heparin Dosing Weight: 77.6 kg  Vital Signs: Temp: 98.7 F (37.1 C) (10/26 1315) Temp Source: Axillary (10/26 1315) BP: 117/47 (10/26 1315) Pulse Rate: 72 (10/26 1315)  Labs: Recent Labs    11/13/20 1043 11/13/20 1048 11/13/20 1100 11/14/20 0751  HGB  --   --  8.4* 8.3*  HCT  --   --  25.0* 25.4*  PLT  --   --  90* 98*  HEPARINUNFRC  --  0.48  --   --   CREATININE 3.79*  3.79*  --   --  2.78*     Estimated Creatinine Clearance: 17.7 mL/min (A) (by C-G formula based on SCr of 2.78 mg/dL (H)).   Medical History: Past Medical History:  Diagnosis Date   Adenomatous colon polyp    Allergy    Anemia    Asthma        CAD (coronary artery disease)    Mild very minimal coronary disease with 20% obtuse marginal stenosis   Carpal tunnel syndrome on left    Cataract    CHF (congestive heart failure) (HCC)    Chronic kidney disease (CKD), stage III (moderate) (HCC)    now stage 4   COPD (chronic obstructive pulmonary disease) ([place])    Dementia with behavioral disturbance 03/09/2020   Depression    Diabetes mellitus 1997   Type II    Diverticulosis    Dyspnea    Elevated diaphragm November 2011   Right side   Esophageal dysmotility    Esophageal stricture    ESRD on hemodialysis ([place]) 03/09/2020   Gastritis    Gastroparesis 08/21/2007   GERD (gastroesophageal reflux disease)    Hearing loss of both ears    Hernia, hiatal    Hyperlipidemia    Hypertension    Morbid obesity ([place])    OSTEOARTHRITIS 08/09/2006   Osteoporosis     Oxygen deficiency    prn    PERIPHERAL NEUROPATHY, FEET 09/23/2007   RENAL INSUFFICIENCY 02/16/2009   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   Seizures ([place])    pt thinks it has been several monthes since she had a seisure   Shingles    Sickle cell trait ([place])    Sleep apnea    uses cpap   Stroke [facility])     Assessment:  75 yo F with acute PE in L upper lobe, no RHS. Pharmacy consulted for heparin dosing.   Patient transitioned to apixaban 10/24, now orders to transition back to heparin with noncompliance and med refusal. Appears that last dose of apixaban was 10/25 at 1435. Will need to monitor heparin using aptt checks until they correlate with daily heparin levels.   Goal of Therapy:  Aptt goal 66-102s Heparin level 0.3-0.7 units/ml Monitor platelets by anticoagulation protocol: Yes   Plan:  Restart heparin at previously therapeutic rate of 650 units/hr Check aptt in 8h then daily Heparin level and CBC daily while on heparin  Erin Hearing PharmD., BCPS Clinical Pharmacist 11/15/2020 1:54 PM

## 2020-11-15 NOTE — Progress Notes (Signed)
PT Cancellation Note  Patient Details Name: Joann Gutierrez MRN: [id number] DOB: Oct 29, 1945   Cancelled Treatment:    Reason Eval/Treat Not Completed: Fatigue/lethargy limiting ability to participate.  Pt is asleep and will open eyes only briefly.  Retry as time and pt allow.   Ramond Dial 11/15/2020, 1:10 PM  Mee Hives, PT PhD Acute Rehab Dept. Number: [place] [facility] [place]

## 2020-11-15 NOTE — Progress Notes (Addendum)
[facility] Progress Note   Subjective:   Pt seen in room, sleeping, opens eyes very briefly. Appears comfortable.   Objective Vitals:   11/14/20 1420 11/14/20 2053 11/14/20 2354 11/15/20 0407  BP: (!) 115/53 (!) 106/57 113/61 116/61  Pulse: 99 97 98 84  Resp: 16 20 17 15   Temp: 99.3 F (37.4 C) 98.9 F (37.2 C) 99 F (37.2 C) 99 F (37.2 C)  TempSrc: Axillary Axillary Axillary Axillary  SpO2: 100% 100% 100% 95%  Weight:      Height:       Physical Exam General: WDWN female, sleeping and in NAD Heart: RRR, no murmur Lungs: CTA anteriorly Abdomen: Soft, non-distended, no gaurding Extremities: no edema b/l lower extremities Dialysis Access: [facility]  Additional Objective Labs: Basic Metabolic Panel: Recent Labs  Lab 11/10/20 0916 11/13/20 1043 11/14/20 0751  NA 139 128*  128* 131*  K 3.8 3.1*  3.0* 3.7  CL 104 96*  95* 99  CO2 25 24  23 25   GLUCOSE 96 123*  125* 113*  BUN 14 9  9 8   CREATININE 3.84* 3.79*  3.79* 2.78*  CALCIUM 8.1* 8.0*  7.9* 7.6*  PHOS  --  2.2*  2.2* 1.6*   Liver Function Tests: Recent Labs  Lab 11/09/20 1728 11/13/20 1043 11/14/20 0751  AST 26  --   --   ALT 12  --   --   ALKPHOS 94  --   --   BILITOT 1.0  --   --   PROT 5.6*  --   --   ALBUMIN 2.3* 1.9* 1.9*   No results for input(s): LIPASE, AMYLASE in the last 168 hours. CBC: Recent Labs  Lab 11/09/20 1728 11/10/20 0227 11/10/20 0249 11/11/20 0500 11/12/20 0618 11/13/20 1100 11/14/20 0751  WBC 13.0*  --  12.5* 6.2 7.0 7.4 5.8  NEUTROABS 11.2*  --   --   --   --   --   --   HGB 9.9*   < > 9.0* 7.0* 8.3* 8.4* 8.3*  HCT 31.1*   < > 28.4* 21.4* 24.7* 25.0* 25.4*  MCV 74.0*  --  74.5* 72.8* 74.0* 73.7* 75.6*  PLT 183  --  146* 107* 89* 90* 98*   < > = values in this interval not displayed.   Blood Culture    Component Value Date/Time   SDES BLOOD LEFT ANTECUBITAL 11/09/2020 1734   SPECREQUEST  11/09/2020 1734    BOTTLES DRAWN AEROBIC AND ANAEROBIC  Blood Culture adequate volume   CULT  11/09/2020 1734    NO GROWTH 5 DAYS Performed at [name], [place] [address] [place], [place] [address]    REPTSTATUS 11/14/2020 FINAL 11/09/2020 1734    Cardiac Enzymes: No results for input(s): CKTOTAL, CKMB, CKMBINDEX, TROPONINI in the last 168 hours. CBG: Recent Labs  Lab 11/14/20 1626 11/14/20 2058 11/15/20 0412 11/15/20 0708 11/15/20 1116  GLUCAP 211* 212* 147* 127* 106*   Iron Studies: No results for input(s): IRON, TIBC, TRANSFERRIN, FERRITIN in the last 72 hours. @lablastinr3 @ Studies/Results: No results found. Medications:  sodium chloride      sodium chloride   Intravenous Once   apixaban  10 mg Oral BID   Followed by   Derrill Memo ON 11/20/2020] apixaban  5 mg Oral BID   Chlorhexidine Gluconate Cloth  6 each Topical Q0600   darbepoetin (ARANESP) injection - DIALYSIS  200 mcg Intravenous Q Tue-HD   feeding supplement (NEPRO CARB STEADY)  237  mL Oral TID BM   mouth rinse  15 mL Mouth Rinse BID   multivitamin  1 tablet Oral QHS   sodium chloride flush  10-40 mL Intracatheter Q12H   thiamine injection  100 mg Intravenous Daily    Dialysis Orders: Center: [facility]  on TTS . 180NRe, 4 hours, BFR 400, DFR 500, EDW 77kg 4K, 2.25Ca, TDC Heparin 6000 unit bolus Mircera 230mcg IV q 2 weeks- last dose 10/31/20 Hectorol 59mcg IV q HD  Assessment/Plan: Syncope/PE: Hypotensive on arrival despite no UF with HD, given 1.5L IVF total. CTA showed PE. LE US performed (+) DVT left common femoral vein. Started on heparin   ESRD:  TTS schedule  Plan for HD 10/27 per her usual schedule.  Hypertension/volume: Hypotensive on arrival, given 1.5L UF and now appears close to euvolemic, no edema on CT but she did have trace edema. Blood pressure improved and she is close to her EDW  Anemia: S/p 1 unit PRBC 10/22 for Hgb 7-now 8.4 and stable. Monitor trend. Pt was on max ESA outpatient and last dose was on 10/11-now on aranesp here q Tuesday. Give  blood transfusion as needed.  Metabolic bone disease: Calcium controlled, last phos was 1.6- will order a dose of k phos neutral. Hold binders, continue to monitor phos and will supplement PRN.   Anice Paganini, PA-C 11/15/2020, 11:35 AM  [facility] Pager: [phone]  Nephrology attending: Patient was seen and examined.  Chart reviewed.  I agree with above. Patient remains confused and deconditioned associated with worsening dementia.  Noted plan for palliative care to meet with the family this Friday.  Next HD tomorrow.  Katheran James, MD [facility].

## 2020-11-15 NOTE — Consult Note (Signed)
Palliative Medicine Inpatient Consult Note  Consulting Provider: [name], MD  Reason for consult:   [facility] Palliative Medicine Consult  Reason for Consult? goal of care    HPI:  Per intake H&P --> 75 year old with past medical history significant for ESRD on hemodialysis TTS, anemia, asthma, CAD, chronic diastolic heart failure, pulmonary hypertension, COPD advanced dementia, chronic ambulatory dysfunction, depression, type 2 diabetes, seizure, chronic respiratory failure on home oxygen, OSA on CPAP, CVA. Was admitted post hemodialysis after witnessed syncopal event with suspected seizure like activity.   Palliative care was asked to get involved to discuss goals of care in the setting of multiple co-morbid conditions.  Clinical Assessment/Goals of Care:  *Please note that this is a verbal dictation therefore any spelling or grammatical errors are due to the "[place]" system interpretation.  I have reviewed medical records including EPIC notes, labs and imaging, received report from bedside RN, assessed the patient who is resting in bed in NAD.    I called patients son, [name] to further discuss diagnosis prognosis, [place], EOL wishes, disposition and options.  A review of patients prior medication conditions was held. We discussed Moncerrat's prior history of ESRD requiring HD treatments, COVID, CAD, COPD, CVA, & diastolic heart failure.    I introduced Palliative Medicine as specialized medical care for people living with serious illness. It focuses on providing relief from the symptoms and stress of a serious illness. The goal is to improve quality of life for both the patient and the family.  Mashayla is from [place], [place]. She is not married but has a long time partner, [name]. She had three children two of whom are deceased. She formerly worked as a [employer] manager. She values her family and is of the [employer].  Prior to  hospitalization Jocelynne required full time care. She required the help of her sister, a niece, and son (when able as he too is disabled) to help with bADLs. She has had a poor appetite for about a month now.   A detailed discussion was had today regarding advanced directives - patients primary decision makers 1.) [name] (sister) and 2.) [employer]  I reviewed that in the chart there is only a power of attorney document for assets and not a HCPOA document. Per family they will look for this.   Concepts specific to code status, artifical feeding and hydration, continued IV antibiotics and rehospitalization was had.  Patient son, [name] states, "I would want everything done for my mom." I educated him that doing everything can often cause further suffering and distress to our loved ones. I shared that I would recommend her truly consider at this time what is best for his mother.   We reviewed some of the worrisome elements of Allan's hospitalization inclusive of her ongoing failure to thrive, her inability to tolerate taking medications, and her bed-bound functional state. Per [name] she still has quality in her life and she understands when he is around and speaks to her. 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  ______________________________________________________ Addendum:  I received a call from the patients bedside RN, Aisha this morning. She shares that patients sister, [name] has come to bedside.  I went up to bedside to meet with [name] in person. We reviewed the role of the Palliative care team in Alena's  case.  I opened the chart and we discussed patients progress notes. Per Lakin Rhine has been declining since contracting COVID in January. [name] shares that after that she became completely immobile and in February had to start hemodialysis due to  renal complications. She shares that from a care perspective it is primary she and a niece who get [name] up with a hoyer lift and provide total care.   I asked how long Alissandra has not been eating for and [name] shares that she got treated with a course of clindamycin about a month ago for a suspected PNA since then she has had little to no appetite. [name] states that she use to at least take in liquids but has been less able to do that recently.  A comprehensive review of Matina's chronic diseases and acute illnesses was hard. We discussed the reasons for her PE and concern long term if we are unable to anticoagulate her. We reviewed that Aashka is chronically ill and when patients no longer have the want need or desire to eat and drink that is often when they are encroaching the end of life. I stated that in the context of dementia this can be accelerated after an acute hospitalization. I strongly recommended against a feeding tube in such instances --> Health care professionals commonly rely on feeding tubes to supply nutrition to these severely demented patients. However, various studies have not shown use of feeding tubes to be effective in preventing malnutrition. Furthermore, they have not been demonstrated to prevent the occurrence or increase the healing of pressure sores, prevent aspiration pneumonia, provide comfort, improve functional status, or extend life. High complication rates, increased use of restraints, and other adverse effects further increase the burden of feeding tubes in severely demented patients.  [name] states that Holl[place] is better able to communicate in the afternoon. She states that if we could set up a meeting that Chrishauna could be a part of that would be of the greatest benefit. She shares that whatever her sister would decide for herself she would respect as her advocate.   [name] is very much in favor of meeting on Friday at 1:30 as she does not work that day for further  discussions. The hope would be that the patient could have some level of participation in the conversation. I shared that I worry Sala may not be completely aware of the situation which her sister understands.  I provided [name] with a "Hard Choices for [employer]" booklet and a MOST form. We reviewed the negative impacts of CPR on patients like Cinthya and that sadly these measures would likely worsen the patients already exceptionally poor quality of life as opposed to improving it.   Discussed the importance of continued conversation with family and their  medical providers regarding overall plan of care and treatment options, ensuring decisions are within the context of the patients values and GOCs.  Decision Maker: [name] (sister) [phone]  SUMMARY OF RECOMMENDATIONS   Full Code --> For the time being. Provided patients sister Hard Choices for [employer]" booklet and a MOST form for review.   Plan for family meeting on Friday at 13:30 in the hopes that patient may be alert enough to participate. I am concerned that given her underlying dementia she may not fully understand the context of the conversation.  Ongoing Palliative care support in the meanwhile  Code Status/Advance Care Planning: FULL CODE  Palliative Prophylaxis:  Oral Care, Turn Q2H  Additional Recommendations (Limitations,  Scope, Preferences): Continue current measures of care   Psycho-social/Spiritual:  Desire for further Chaplaincy support: Yes - [facility] Additional Recommendations: Education on chronic disease.    Prognosis: Very worrisome in the setting of ongoing FTT, Hypoalbuminemia, ESRD, COPD dHF has an increased mortality risk.   Discharge Planning: Unclear right now.   Vitals:   11/14/20 2354 11/15/20 0407  BP: 113/61 116/61  Pulse: 98 84  Resp: 17 15  Temp: 99 F (37.2 C) 99 F (37.2 C)  SpO2: 100% 95%    Intake/Output Summary (Last 24 hours) at 11/15/2020 0636 Last data filed  at 11/14/2020 1110 Gross per 24 hour  Intake --  Output 859 ml  Net -859 ml   Last Weight  Most recent update: 11/14/2020 11:48 AM    Weight  77.9 kg (171 lb 11.8 oz)            Gen:  Chronically ill appearing AA F   HEENT: Dry mucous membranes CV: Regular rate and rhythm PULM: On RA ABD: soft/nontender  EXT: Generalized anasarca Neuro: Somnolent  PPS: 10%   This conversation/these recommendations were discussed with patient primary care team, Dr. Maryland Pink  Time In: 1000 Time Out: 1206 Total Time: 126 Greater than 50%  of this time was spent counseling and coordinating care related to the above assessment and plan.  [facility] Team Team Cell Phone: [phone] Please utilize secure chat with additional questions, if there is no response within 30 minutes please call the above phone number  Palliative Medicine Team providers are available by phone from 7am to 7pm daily and can be reached through the team cell phone.  Should this patient require assistance outside of these hours, please call the patient's attending physician.

## 2020-11-16 DIAGNOSIS — R4182 Altered mental status, unspecified: Secondary | ICD-10-CM | POA: Diagnosis not present

## 2020-11-16 DIAGNOSIS — N186 End stage renal disease: Secondary | ICD-10-CM | POA: Diagnosis not present

## 2020-11-16 DIAGNOSIS — I2699 Other pulmonary embolism without acute cor pulmonale: Secondary | ICD-10-CM | POA: Diagnosis not present

## 2020-11-16 LAB — APTT
aPTT: 136 seconds — ABNORMAL HIGH (ref 24–36)
aPTT: 150 seconds — ABNORMAL HIGH (ref 24–36)

## 2020-11-16 LAB — CBC
HCT: 24.8 % — ABNORMAL LOW (ref 36.0–46.0)
Hemoglobin: 8.1 g/dL — ABNORMAL LOW (ref 12.0–15.0)
MCH: 25.1 pg — ABNORMAL LOW (ref 26.0–34.0)
MCHC: 32.7 g/dL (ref 30.0–36.0)
MCV: 76.8 fL — ABNORMAL LOW (ref 80.0–100.0)
Platelets: 116 10*3/uL — ABNORMAL LOW (ref 150–400)
RBC: 3.23 MIL/uL — ABNORMAL LOW (ref 3.87–5.11)
RDW: 25.8 % — ABNORMAL HIGH (ref 11.5–15.5)
WBC: 8.1 10*3/uL (ref 4.0–10.5)
nRBC: 0.2 % (ref 0.0–0.2)

## 2020-11-16 LAB — GLUCOSE, CAPILLARY
Glucose-Capillary: 143 mg/dL — ABNORMAL HIGH (ref 70–99)
Glucose-Capillary: 196 mg/dL — ABNORMAL HIGH (ref 70–99)
Glucose-Capillary: 198 mg/dL — ABNORMAL HIGH (ref 70–99)
Glucose-Capillary: 224 mg/dL — ABNORMAL HIGH (ref 70–99)
Glucose-Capillary: 89 mg/dL (ref 70–99)

## 2020-11-16 LAB — VITAMIN B1: Vitamin B1 (Thiamine): 51.4 nmol/L — ABNORMAL LOW (ref 66.5–200.0)

## 2020-11-16 MED ORDER — HEPARIN SODIUM (PORCINE) 1000 UNIT/ML IJ SOLN
INTRAMUSCULAR | Status: AC
  Administered 2020-11-16: 3800 [IU]
  Filled 2020-11-16: qty 4

## 2020-11-16 NOTE — Progress Notes (Addendum)
ANTICOAGULATION CONSULT NOTE - Follow-up Consult  Pharmacy Consult for apixaban>heparin Indication: pulmonary embolus  Allergies  Allergen Reactions   Morphine And Related Other (See Comments)    Family request not to be given, reports pt does not wake up when given    Promethazine Hcl Other (See Comments)    REACTION: lethargy    Patient Measurements: Height: 5\' 4"  (162.6 cm) Weight: 78 kg (171 lb 15.3 oz) IBW/kg (Calculated) : 54.7 Heparin Dosing Weight: 77.6 kg  Vital Signs: Temp: 97.1 F (36.2 C) (10/27 1137) Temp Source: Axillary (10/27 1137) BP: 126/67 (10/27 1145) Pulse Rate: 87 (10/27 1145)  Labs: Recent Labs    11/14/20 0751 11/15/20 2308 11/16/20 0046  HGB 8.3*  --  8.1*  HCT 25.4*  --  24.8*  PLT 98*  --  116*  APTT  --  150* 136*  CREATININE 2.78*  --   --      Estimated Creatinine Clearance: 17.7 mL/min (A) (by C-G formula based on SCr of 2.78 mg/dL (H)).   Medical History: Past Medical History:  Diagnosis Date   Adenomatous colon polyp    Allergy    Anemia    Asthma        CAD (coronary artery disease)    Mild very minimal coronary disease with 20% obtuse marginal stenosis   Carpal tunnel syndrome on left    Cataract    CHF (congestive heart failure) (HCC)    Chronic kidney disease (CKD), stage III (moderate) (HCC)    now stage 4   COPD (chronic obstructive pulmonary disease) ([place])    Dementia with behavioral disturbance 03/09/2020   Depression    Diabetes mellitus 1997   Type II    Diverticulosis    Dyspnea    Elevated diaphragm November 2011   Right side   Esophageal dysmotility    Esophageal stricture    ESRD on hemodialysis ([place]) 03/09/2020   Gastritis    Gastroparesis 08/21/2007   GERD (gastroesophageal reflux disease)    Hearing loss of both ears    Hernia, hiatal    Hyperlipidemia    Hypertension    Morbid obesity ([place])    OSTEOARTHRITIS 08/09/2006   Osteoporosis    Oxygen deficiency    prn    PERIPHERAL NEUROPATHY,  FEET 09/23/2007   RENAL INSUFFICIENCY 02/16/2009   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   Seizures ([place])    pt thinks it has been several monthes since she had a seisure   Shingles    Sickle cell trait ([place])    Sleep apnea    uses cpap   Stroke [facility])     Assessment:  75 yo F with acute PE in L upper lobe, no RHS. Pharmacy consulted for heparin dosing.   Patient transitioned to apixaban 10/24, now orders to transition back to heparin with noncompliance and med refusal. Last dose of apixaban was 10/25 at 1435. Will need to monitor heparin using aptt checks until they correlate with daily heparin levels.   Difficult situation with last aptt done by foot stick and elevated. Unable to stick opposite arm d/t HD restrictions. Labs not drawn prior to HD as written. Will decrease heparin rate further and recheck labs in the morning.   Goal of Therapy:  Aptt goal 66-102s Heparin level 0.3-0.7 units/ml Monitor platelets by anticoagulation protocol: Yes   Plan:  Decrease heparin to 500 units/hr Recheck aptt in am Heparin level and CBC daily while on heparin  Erin Hearing PharmD., BCPS Clinical  Pharmacist 11/16/2020 12:25 PM

## 2020-11-16 NOTE — Progress Notes (Signed)
ANTICOAGULATION CONSULT NOTE - Follow-up Consult  Pharmacy Consult for apixaban>heparin Indication: pulmonary embolus  Allergies  Allergen Reactions   Morphine And Related Other (See Comments)    Family request not to be given, reports pt does not wake up when given    Promethazine Hcl Other (See Comments)    REACTION: lethargy    Patient Measurements: Height: 5\' 4"  (162.6 cm) Weight: 77.9 kg (171 lb 11.8 oz) IBW/kg (Calculated) : 54.7 Heparin Dosing Weight: 77.6 kg  Vital Signs: Temp: 98.1 F (36.7 C) (10/26 2035) Temp Source: Axillary (10/26 2035) BP: 139/71 (10/26 2035) Pulse Rate: 88 (10/26 2035)  Labs: Recent Labs    11/13/20 1043 11/13/20 1048 11/13/20 1100 11/14/20 0751 11/15/20 2308  HGB  --   --  8.4* 8.3*  --   HCT  --   --  25.0* 25.4*  --   PLT  --   --  90* 98*  --   APTT  --   --   --   --  150*  HEPARINUNFRC  --  0.48  --   --   --   CREATININE 3.79*  3.79*  --   --  2.78*  --      Estimated Creatinine Clearance: 17.7 mL/min (A) (by C-G formula based on SCr of 2.78 mg/dL (H)).   Medical History: Past Medical History:  Diagnosis Date   Adenomatous colon polyp    Allergy    Anemia    Asthma        CAD (coronary artery disease)    Mild very minimal coronary disease with 20% obtuse marginal stenosis   Carpal tunnel syndrome on left    Cataract    CHF (congestive heart failure) (HCC)    Chronic kidney disease (CKD), stage III (moderate) (HCC)    now stage 4   COPD (chronic obstructive pulmonary disease) ([place])    Dementia with behavioral disturbance 03/09/2020   Depression    Diabetes mellitus 1997   Type II    Diverticulosis    Dyspnea    Elevated diaphragm November 2011   Right side   Esophageal dysmotility    Esophageal stricture    ESRD on hemodialysis ([place]) 03/09/2020   Gastritis    Gastroparesis 08/21/2007   GERD (gastroesophageal reflux disease)    Hearing loss of both ears    Hernia, hiatal    Hyperlipidemia    Hypertension     Morbid obesity ([place])    OSTEOARTHRITIS 08/09/2006   Osteoporosis    Oxygen deficiency    prn    PERIPHERAL NEUROPATHY, FEET 09/23/2007   RENAL INSUFFICIENCY 02/16/2009   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   Seizures ([place])    pt thinks it has been several monthes since she had a seisure   Shingles    Sickle cell trait ([place])    Sleep apnea    uses cpap   Stroke [facility])     Assessment:  75 yo F with acute PE in L upper lobe, no RHS. Pharmacy consulted for heparin dosing.   Patient transitioned to apixaban 10/24, now orders to transition back to heparin with noncompliance and med refusal. Appears that last dose of apixaban was 10/25 at 1435. Will need to monitor heparin using aptt checks until they correlate with daily heparin levels.   aPTT 150 sec (supratherapeutic) on gtt at 650 units/hr. aPTT drawn from wrist where heparin is running in forearm - pt is restricted on the other side so unable to get  labs from there. No bleeding noted per RN.   Repeat aPTT 136 (footstick) remains supratherapeutic on 650 units/hr.  Goal of Therapy:  Aptt goal 66-102s Heparin level 0.3-0.7 units/ml Monitor platelets by anticoagulation protocol: Yes   Plan:  Decrease heparin to 550 units/hr Will f/u heparin level and aPTT pre-HD tomorrow (so they can be drawn from HD catheter)  Sherlon Handing, PharmD, BCPS Please see amion for complete clinical pharmacist phone list 11/16/2020 12:10 AM

## 2020-11-16 NOTE — Progress Notes (Addendum)
PROGRESS NOTE    Joann Gutierrez  LZJ:[id number] DOB: 11-Jul-1945 DOA: 11/09/2020  PCP: Andree Moro, DO    Brief Narrative: 75 year old with past medical history significant for ESRD on hemodialysis TTS, anemia, asthma, CAD, chronic diastolic heart failure, pulmonary hypertension, COPD advanced dementia, chronic ambulatory dysfunction, depression, type 2 diabetes, seizure, chronic respiratory failure on home oxygen, OSA on CPAP, CVA presented to the ED via EMS from her hemodialysis center after she completed full treatment, no fluid was removed.  After patient completed treatment she had a witnessed syncopal episode with seizure-like activity.  Brief episode of apnea, chest compressions started after which patient woke up.  Hypoglycemic CBG 53.  She was also hypotensive 53/27 when EMS arrived.  She received IV bolus.  In the ED blood pressure was 82/54.  Patient received IV fluids.  However patient has had very poor oral intake and decreased mentation over the last several days.  Palliative care was consulted.    Assessment & Plan:   Acute pulmonary embolism and left upper lobe, No right side heart strain per CT -Patient initially presented with worsening hypoxemia and hypotension. -Troponin not significantly elevated at 25 and 27. -Patient was started on IV heparin.  Seen by critical care medicine.  Echocardiogram showed normal right ventricular function.  Transitioned to Eliquis on 10/24. Due to extremely poor oral intake patient has not been receiving this medication consistently.  Patient was transitioned back to IV heparin on 10/26.  Palliative care to have a family meeting on Friday.  SIRS: Sepsis ruled out.  Patient presented with hypotension, tachycardia, lactic acidosis with lactic acid at 8, leukocytosis. Related to hypotension, hypoperfusion, from PE. Completed Cefepime for 5 days.  Blood cultures: No growth to date.  CT chest Negative for pneumonia, CT abdomen no evidence of  infectious process. Pro-calcitonin elevated.  Discontinued vancomycin and flagyl 10/22.  Acute on chronic hypoxic respiratory failure: Initially required oxygen presumably due to PE.  Now on room air.    Diabetes mellitus type 2 with episodes of hypoglycemia  Not on insulin or hypoglycemic agent.  Low glucose levels likely due to poor oral intake.  Did require D10 infusion briefly.  CBGs have been stable for the last 24 hours  Mild troponin elevation Likely demand, PE, mildly elevation  Microcytic Anemia/thrombocytopenia Patient did require 1 unit of PRBC for hemoglobin of 7.0.  No evidence of overt bleeding noted.  Hemoglobin noted to be stable. Low platelet count likely due to consumption.  Noted to be better today.  ESRD on hemodialysis TTS Nephrology following.  Chronic diastolic heart failure: Volume management with dialysis COPD: Continue with DuoNeb OSA: Continue with CPAP  Dementia: Delirium: Patient gets agitated at times and sometimes hits staff Delirium precaution. She has not been talking.  CT head on admission negative.  EEG negative for seizure.   Questionable seizure: Likely related to hypoperfusion rather than real seizure EEG Ordered.  Goals of care Patient appears to be declining.  Oral intake has been very poor.  She also has advanced dementia which is also contributing.  Her mentation seems to be waxing and waning.  Prognosis would still be poor if her oral intake does not improve. Palliative care is supposed to meet with family this Friday.    DVT prophylaxis: Heparin drip Code Status: Full code Family Communication: No family at bedside. Disposition Plan: Unclear for now.  Status is: Inpatient  Remains inpatient appropriate because: Monitor oral intake, CBG for hypoglycemia. Might need SNF  Consultants:  Nephrology Palliative care  Procedures:  Echo Doppler lower extremity  Antimicrobials:    Subjective: Patient seen at dialysis.   Noted to be awake alert today.  Does not answer any questions.  Not fully cooperative.  Objective: Vitals:   11/16/20 0900 11/16/20 0930 11/16/20 1000 11/16/20 1030  BP: 131/66 113/62 (!) 82/60 126/60  Pulse:      Resp:      Temp:      TempSrc:      SpO2:      Weight:      Height:        Intake/Output Summary (Last 24 hours) at 11/16/2020 1045 Last data filed at 11/16/2020 0412 Gross per 24 hour  Intake 87.9 ml  Output --  Net 87.9 ml    Filed Weights   11/14/20 0703 11/14/20 1128 11/16/20 0725  Weight: 78.7 kg 77.9 kg 80.2 kg    Examination:  General appearance: Awake alert.  In no distress Resp: Clear to auscultation bilaterally.  Normal effort Cardio: S1-S2 is normal regular.  No S3-S4.  No rubs murmurs or bruit GI: Abdomen is soft.  Nontender nondistended.  Bowel sounds are present normal.  No masses organomegaly Neurologic: No focal neurological deficits.    Data Reviewed: I have personally reviewed following labs and imaging studies  CBC: Recent Labs  Lab 11/09/20 1728 11/10/20 0227 11/11/20 0500 11/12/20 0618 11/13/20 1100 11/14/20 0751 11/16/20 0046  WBC 13.0*   < > 6.2 7.0 7.4 5.8 8.1  NEUTROABS 11.2*  --   --   --   --   --   --   HGB 9.9*   < > 7.0* 8.3* 8.4* 8.3* 8.1*  HCT 31.1*   < > 21.4* 24.7* 25.0* 25.4* 24.8*  MCV 74.0*   < > 72.8* 74.0* 73.7* 75.6* 76.8*  PLT 183   < > 107* 89* 90* 98* 116*   < > = values in this interval not displayed.    Basic Metabolic Panel: Recent Labs  Lab 11/09/20 1728 11/10/20 0227 11/10/20 0916 11/13/20 1043 11/14/20 0751  NA 138 140 139 128*  128* 131*  K 3.9 3.8 3.8 3.1*  3.0* 3.7  CL 101  --  104 96*  95* 99  CO2 19*  --  25 24  23 25   GLUCOSE 172*  --  96 123*  125* 113*  BUN 8  --  14 9  9 8   CREATININE 3.03*  --  3.84* 3.79*  3.79* 2.78*  CALCIUM 7.6*  --  8.1* 8.0*  7.9* 7.6*  PHOS  --   --   --  2.2*  2.2* 1.6*    GFR: Estimated Creatinine Clearance: 17.9 mL/min (A) (by C-G  formula based on SCr of 2.78 mg/dL (H)). Liver Function Tests: Recent Labs  Lab 11/09/20 1728 11/13/20 1043 11/14/20 0751  AST 26  --   --   ALT 12  --   --   ALKPHOS 94  --   --   BILITOT 1.0  --   --   PROT 5.6*  --   --   ALBUMIN 2.3* 1.9* 1.9*    Recent Labs  Lab 11/10/20 0916  AMMONIA 11    Coagulation Profile: Recent Labs  Lab 11/10/20 1912  INR 1.7*     CBG: Recent Labs  Lab 11/15/20 0708 11/15/20 1116 11/15/20 1650 11/15/20 2102 11/16/20 0030  GLUCAP 127* 106* 101* 127* 143*    Sepsis Labs: Recent Labs  Lab 11/09/20 1729 11/09/20 1955 11/10/20 0249  PROCALCITON  --   --  8.79  LATICACIDVEN 8.4* 6.9* 1.4     Recent Results (from the past 240 hour(s))  Blood culture (routine x 2)     Status: None   Collection Time: 11/09/20  5:34 PM   Specimen: BLOOD  Result Value Ref Range Status   Specimen Description BLOOD LEFT ANTECUBITAL  Final   Special Requests   Final    BOTTLES DRAWN AEROBIC AND ANAEROBIC Blood Culture adequate volume   Culture   Final    NO GROWTH 5 DAYS Performed at [facility], [address], [place], [place] [address]    Report Status 11/14/2020 FINAL  Final  Resp Panel by RT-PCR (Flu A&B, Covid) Nasopharyngeal Swab     Status: None   Collection Time: 11/09/20  7:24 PM   Specimen: Nasopharyngeal Swab; Nasopharyngeal(NP) swabs in vial transport medium  Result Value Ref Range Status   SARS Coronavirus 2 by RT PCR NEGATIVE NEGATIVE Final    Comment: (NOTE) SARS-CoV-2 target nucleic acids are NOT DETECTED.  The SARS-CoV-2 RNA is generally detectable in upper respiratory specimens during the acute phase of infection. The lowest concentration of SARS-CoV-2 viral copies this assay can detect is 138 copies/mL. A negative result does not preclude SARS-Cov-2 infection and should not be used as the sole basis for treatment or other patient management decisions. A negative result may occur with  improper specimen  collection/handling, submission of specimen other than nasopharyngeal swab, presence of viral mutation(s) within the areas targeted by this assay, and inadequate number of viral copies(<138 copies/mL). A negative result must be combined with clinical observations, patient history, and epidemiological information. The expected result is Negative.  Fact Sheet for Patients:  EntrepreneurPulse.com.au  Fact Sheet for Healthcare Providers:  IncredibleEmployment.be  This test is no t yet approved or cleared by the [place] FDA and  has been authorized for detection and/or diagnosis of SARS-CoV-2 by FDA under an Emergency Use Authorization (EUA). This EUA will remain  in effect (meaning this test can be used) for the duration of the COVID-19 declaration under Section 564(b)(1) of the Act, 21 U.S.C.section 360bbb-3(b)(1), unless the authorization is terminated  or revoked sooner.       Influenza A by PCR NEGATIVE NEGATIVE Final   Influenza B by PCR NEGATIVE NEGATIVE Final    Comment: (NOTE) The Xpert Xpress SARS-CoV-2/FLU/RSV plus assay is intended as an aid in the diagnosis of influenza from Nasopharyngeal swab specimens and should not be used as a sole basis for treatment. Nasal washings and aspirates are unacceptable for Xpert Xpress SARS-CoV-2/FLU/RSV testing.  Fact Sheet for Patients: EntrepreneurPulse.com.au  Fact Sheet for Healthcare Providers: IncredibleEmployment.be  This test is not yet approved or cleared by the [place] FDA and has been authorized for detection and/or diagnosis of SARS-CoV-2 by FDA under an Emergency Use Authorization (EUA). This EUA will remain in effect (meaning this test can be used) for the duration of the COVID-19 declaration under Section 564(b)(1) of the Act, 21 U.S.C. section 360bbb-3(b)(1), unless the authorization is terminated or revoked.  Performed at [facility], [place] [address], [place], [place] [address]           Radiology Studies: No results found.    Scheduled Meds:  sodium chloride   Intravenous Once   Chlorhexidine Gluconate Cloth  6 each Topical Q0600   Chlorhexidine Gluconate Cloth  6 each Topical Q0600  darbepoetin (ARANESP) injection - DIALYSIS  200 mcg Intravenous Q Tue-HD   feeding supplement (NEPRO CARB STEADY)  237 mL Oral TID BM   mouth rinse  15 mL Mouth Rinse BID   multivitamin  1 tablet Oral QHS   sodium chloride flush  10-40 mL Intracatheter Q12H   thiamine injection  100 mg Intravenous Daily   Continuous Infusions:  sodium chloride     heparin 550 Units/hr (11/16/20 0412)     LOS: 7 days     Bonnielee Haff, MD [facility]   If 7PM-7AM, please contact night-coverage [email or web address]  11/16/2020, 10:45 AM

## 2020-11-16 NOTE — Progress Notes (Addendum)
[facility] Progress Note   Subjective:   Pt seen on HD. Not responding to questions but pushed my stethoscope off my chest. Appears comfortable.   Objective Vitals:   11/15/20 2035 11/16/20 0517 11/16/20 0725 11/16/20 0737  BP: 139/71 (!) 109/57 (!) 141/74 (!) 143/69  Pulse: 88 79 86 81  Resp: 16 17 12 14   Temp: 98.1 F (36.7 C) 98.5 F (36.9 C) 97.6 F (36.4 C) 97.6 F (36.4 C)  TempSrc: Axillary Axillary    SpO2: 90% 100% 100%   Weight:   80.2 kg   Height:       Physical Exam General: WDWN female, alert and in NAD Heart: RRR, no murmur Lungs: CTA anteriorly Abdomen: soft, non-distended, no guarding Extremities: no edema b/l lower extremities Dialysis Access: [facility] accessed  Additional Objective Labs: Basic Metabolic Panel: Recent Labs  Lab 11/10/20 0916 11/13/20 1043 11/14/20 0751  NA 139 128*  128* 131*  K 3.8 3.1*  3.0* 3.7  CL 104 96*  95* 99  CO2 25 24  23 25   GLUCOSE 96 123*  125* 113*  BUN 14 9  9 8   CREATININE 3.84* 3.79*  3.79* 2.78*  CALCIUM 8.1* 8.0*  7.9* 7.6*  PHOS  --  2.2*  2.2* 1.6*   Liver Function Tests: Recent Labs  Lab 11/09/20 1728 11/13/20 1043 11/14/20 0751  AST 26  --   --   ALT 12  --   --   ALKPHOS 94  --   --   BILITOT 1.0  --   --   PROT 5.6*  --   --   ALBUMIN 2.3* 1.9* 1.9*   No results for input(s): LIPASE, AMYLASE in the last 168 hours. CBC: Recent Labs  Lab 11/09/20 1728 11/10/20 0227 11/11/20 0500 11/12/20 0618 11/13/20 1100 11/14/20 0751 11/16/20 0046  WBC 13.0*   < > 6.2 7.0 7.4 5.8 8.1  NEUTROABS 11.2*  --   --   --   --   --   --   HGB 9.9*   < > 7.0* 8.3* 8.4* 8.3* 8.1*  HCT 31.1*   < > 21.4* 24.7* 25.0* 25.4* 24.8*  MCV 74.0*   < > 72.8* 74.0* 73.7* 75.6* 76.8*  PLT 183   < > 107* 89* 90* 98* 116*   < > = values in this interval not displayed.   Blood Culture    Component Value Date/Time   SDES BLOOD LEFT ANTECUBITAL 11/09/2020 1734   SPECREQUEST  11/09/2020 1734     BOTTLES DRAWN AEROBIC AND ANAEROBIC Blood Culture adequate volume   CULT  11/09/2020 1734    NO GROWTH 5 DAYS Performed at [facility], [place] [address] [place], [place] [address]    REPTSTATUS 11/14/2020 FINAL 11/09/2020 1734    Cardiac Enzymes: No results for input(s): CKTOTAL, CKMB, CKMBINDEX, TROPONINI in the last 168 hours. CBG: Recent Labs  Lab 11/15/20 0708 11/15/20 1116 11/15/20 1650 11/15/20 2102 11/16/20 0030  GLUCAP 127* 106* 101* 127* 143*   Iron Studies: No results for input(s): IRON, TIBC, TRANSFERRIN, FERRITIN in the last 72 hours. @lablastinr3 @ Studies/Results: No results found. Medications:  sodium chloride     heparin 550 Units/hr (11/16/20 0412)    sodium chloride   Intravenous Once   Chlorhexidine Gluconate Cloth  6 each Topical Q0600   Chlorhexidine Gluconate Cloth  6 each Topical Q0600   darbepoetin (ARANESP) injection - DIALYSIS  200 mcg Intravenous Q Tue-HD   feeding  supplement (NEPRO CARB STEADY)  237 mL Oral TID BM   mouth rinse  15 mL Mouth Rinse BID   multivitamin  1 tablet Oral QHS   phosphorus  250 mg Oral Daily   sodium chloride flush  10-40 mL Intracatheter Q12H   thiamine injection  100 mg Intravenous Daily    Dialysis Orders: Center: [facility]  on TTS . 180NRe, 4 hours, BFR 400, DFR 500, EDW 77kg 4K, 2.25Ca, TDC Heparin 6000 unit bolus Mircera 215mcg IV q 2 weeks- last dose 10/31/20 Hectorol 79mcg IV q HD    Assessment/Plan: Syncope/PE: Hypotensive on arrival despite no UF with HD, given 1.5L IVF total. CTA showed PE. LE US performed (+) DVT left common femoral vein. Started on heparin   ESRD:  TTS schedule  Seen on HD today, tolerating well so far. .  Hypertension/volume: Hypotensive on arrival, given 1.5L UF and now appears close to euvolemic, no edema on CT but she did have trace edema. Blood pressure improved however weights are variable. UF as tolerated today.   Anemia: S/p 1 unit PRBC 10/22 for Hgb 7-now 8.1 and stable.  Monitor trend. Pt was on max ESA outpatient and last dose was on 10/11-now on aranesp here q Tuesday. Give blood transfusion as needed.  Metabolic bone disease: Calcium controlled, last phos was 1.6- will order a dose of k phos neutral but unfortunately patient is refusing most medications. Repeat phos level pending this AM. Binders on hold.  Dispo: Patient does not seem to be making much progress and is refusing most meds including eliquis. Palliative care is planning a family meeting tomorrow.   Anice Paganini, PA-C 11/16/2020, 7:49 AM  [facility] Pager: [phone]  I have seen and examined this patient and agree with plan and assessment in the above note with renal recommendations/intervention highlighted. Pt currently tolerating HD with goal uf 1.5L. Broadus John A Kateena Degroote,MD 11/16/2020 8:04 AM

## 2020-11-16 NOTE — Evaluation (Signed)
Physical Therapy Evaluation Patient Details Name: Joann Gutierrez MRN: [id number] DOB: 11-24-1945 Today's Date: 11/16/2020  History of Present Illness  75 yo female presenting to ED on 10/20 with syncopal/seizure episode at dialysis center. Chest compressions started and quickly stopped after patient woke up. Head CT negative. PMH including arthma, CHF, DM, depression, dementia with behavioral disturbances, HTN, L TKA (1998), and AV fistula placement (2020).   Clinical Impression  Pt in bed upon arrival of PT, agreeable to evaluation at this time. The pt was unable to consistently answer questions regarding PLOF (open ended or yes-no) at this time despite attempts to give simple, direct options and significantly increased processing time. The pt did respond a few times with longer answers such as "I want to go home" and "you don't have to feed me", but otherwise did not answer questions or follow instructions from therapist. The pt grimaced with all PROM of BLE, and has significant limitations in bilateral DF ROM. Recommend continued therapies acutely as well as SNF for continued rehab following d/c to maximize pt ability to participate in care and achieve maximal independence.   Will likely need significant assist at d/c, if family would like to return home I recommend hospital bed, lift, and WC to ease caregiver burden.         Recommendations for follow up therapy are one component of a multi-disciplinary discharge planning process, led by the attending physician.  Recommendations may be updated based on patient status, additional functional criteria and insurance authorization.  Follow Up Recommendations Skilled nursing-short term rehab (<3 hours/day)    Assistance Recommended at Discharge Frequent or constant Supervision/Assistance  Functional Status Assessment Patient has had a recent decline in their functional status and demonstrates the ability to make significant improvements in  function in a reasonable and predictable amount of time.  Equipment Recommendations  None recommended by PT (defer to post acute, if home, would need hospital bed, lift, and WC)    Recommendations for Other Services       Precautions / Restrictions Precautions Precautions: Fall Restrictions Weight Bearing Restrictions: No      Mobility  Bed Mobility Overal bed mobility: Needs Assistance Bed Mobility: Rolling;Supine to Sit;Sit to Supine Rolling: Max assist;+2 for safety/equipment;+2 for physical assistance   Supine to sit: Max assist;HOB elevated Sit to supine: Max assist   General bed mobility comments: pt required maxA to complete rolling bilaterally, pulled to long-sitting position with maxA from therapist and then minA to maintain.    Transfers                   General transfer comment: Defer due to safety and lack of pt initiation of movements       Balance Overall balance assessment: Needs assistance Sitting-balance support: Single extremity supported Sitting balance-[name]: Poor Sitting balance - Comments: static sitting in bed with strong lean to R Postural control: Right lateral lean                                   Pertinent Vitals/Pain Pain Assessment: Faces Faces Pain Scale: Hurts little more Pain Location: LE with PROM Pain Descriptors / Indicators: Discomfort;Grimacing Pain Intervention(s): Monitored during session;Limited activity within patient's tolerance;Repositioned    Home Living Family/patient expects to be discharged to:: Skilled nursing facility                   Additional Comments:  Pt unable to provide information, per palliative note, pt receiving assist from family PTA    Prior Function Prior Level of Function : Patient poor historian/Family not available                     Hand Dominance   Dominant Hand: Right    Extremity/Trunk Assessment   Upper Extremity Assessment Upper  Extremity Assessment: Defer to OT evaluation    Lower Extremity Assessment Lower Extremity Assessment: Generalized weakness;Difficult to assess due to impaired cognition (no AROM noted in either LE this session. pt limited in DF bilaterally, suspect she is not wt bearing through LE often due to pain with any passive ankle DF. grimacing with all attempts to passively move LE. edema bilateral ankles and feet)    Cervical / Trunk Assessment Cervical / Trunk Assessment: Kyphotic  Communication   Communication: Expressive difficulties (pt mostly staring off, but was able to compete a few sentences through session)  Cognition Arousal/Alertness: Awake/alert Behavior During Therapy: Flat affect Overall Cognitive Status: No family/caregiver present to determine baseline cognitive functioning                                 General Comments: pt fidgeting with hands, mostly staring off rather than looking at therapist. at times, she was able to answer simple, direct questions with ~10 seconds processing time. did state "you don't have to feed me" after therapist feeding pt 6 bites of food, but then she made no attempts to reach for utensils or eat        General Comments General comments (skin integrity, edema, etc.): VSS on RA    Exercises     Assessment/Plan    PT Assessment Patient needs continued PT services  PT Problem List Decreased strength;Decreased range of motion;Decreased activity tolerance;Decreased balance;Decreased mobility;Decreased coordination;Decreased cognition;Decreased safety awareness;Pain       PT Treatment Interventions DME instruction;Gait training;Stair training;Functional mobility training;Therapeutic exercise;Therapeutic activities;Balance training;Patient/family education    PT Goals (Current goals can be found in the Care Plan section)  Acute Rehab PT Goals Patient Stated Goal: to go home PT Goal Formulation: With patient Time For Goal  Achievement: 11/30/20 Potential to Achieve Goals: Fair    Frequency Min 2X/week   Barriers to discharge Decreased caregiver support unsure of caregiver support, pt would require totalA and hospital bed and lift       AM-PAC PT "6 Clicks" Mobility  Outcome Measure Help needed turning from your back to your side while in a flat bed without using bedrails?: Total Help needed moving from lying on your back to sitting on the side of a flat bed without using bedrails?: Total Help needed moving to and from a bed to a chair (including a wheelchair)?: Total Help needed standing up from a chair using your arms (e.g., wheelchair or bedside chair)?: Total Help needed to walk in hospital room?: Total Help needed climbing 3-5 steps with a railing? : Total 6 Click Score: 6    End of Session   Activity Tolerance: Other (comment) (limited command following or participation) Patient left: in bed;with call bell/phone within reach;with bed alarm set (with food, in chair position) Nurse Communication: Mobility status PT Visit Diagnosis: Other abnormalities of gait and mobility (R26.89);Muscle weakness (generalized) (M62.81)    Time: 6073-7106 PT Time Calculation (min) (ACUTE ONLY): 36 min   Charges:   PT Evaluation $PT Eval Moderate Complexity: 1 Mod  PT Treatments $Therapeutic Activity: 8-22 mins        West Carbo, PT, DPT   Acute Rehabilitation Department Pager #: [phone]  Sandra Cockayne 11/16/2020, 6:36 PM

## 2020-11-17 DIAGNOSIS — I2699 Other pulmonary embolism without acute cor pulmonale: Secondary | ICD-10-CM | POA: Diagnosis not present

## 2020-11-17 DIAGNOSIS — N186 End stage renal disease: Secondary | ICD-10-CM | POA: Diagnosis not present

## 2020-11-17 DIAGNOSIS — A419 Sepsis, unspecified organism: Secondary | ICD-10-CM | POA: Diagnosis not present

## 2020-11-17 DIAGNOSIS — E872 Acidosis, unspecified: Secondary | ICD-10-CM | POA: Diagnosis not present

## 2020-11-17 DIAGNOSIS — R4182 Altered mental status, unspecified: Secondary | ICD-10-CM | POA: Diagnosis not present

## 2020-11-17 DIAGNOSIS — J9621 Acute and chronic respiratory failure with hypoxia: Secondary | ICD-10-CM | POA: Diagnosis not present

## 2020-11-17 DIAGNOSIS — R6520 Severe sepsis without septic shock: Secondary | ICD-10-CM

## 2020-11-17 LAB — CBC
HCT: 24.5 % — ABNORMAL LOW (ref 36.0–46.0)
Hemoglobin: 8.2 g/dL — ABNORMAL LOW (ref 12.0–15.0)
MCH: 25.3 pg — ABNORMAL LOW (ref 26.0–34.0)
MCHC: 33.5 g/dL (ref 30.0–36.0)
MCV: 75.6 fL — ABNORMAL LOW (ref 80.0–100.0)
Platelets: 159 10*3/uL (ref 150–400)
RBC: 3.24 MIL/uL — ABNORMAL LOW (ref 3.87–5.11)
RDW: 25.9 % — ABNORMAL HIGH (ref 11.5–15.5)
WBC: 10.4 10*3/uL (ref 4.0–10.5)
nRBC: 0.8 % — ABNORMAL HIGH (ref 0.0–0.2)

## 2020-11-17 LAB — RENAL FUNCTION PANEL
Albumin: 1.9 g/dL — ABNORMAL LOW (ref 3.5–5.0)
Anion gap: 6 (ref 5–15)
BUN: 8 mg/dL (ref 8–23)
CO2: 29 mmol/L (ref 22–32)
Calcium: 8.2 mg/dL — ABNORMAL LOW (ref 8.9–10.3)
Chloride: 98 mmol/L (ref 98–111)
Creatinine, Ser: 2.67 mg/dL — ABNORMAL HIGH (ref 0.44–1.00)
GFR, Estimated: 18 mL/min — ABNORMAL LOW (ref >60)
Glucose, Bld: 131 mg/dL — ABNORMAL HIGH (ref 70–99)
Phosphorus: <1 mg/dL — CL (ref 2.5–4.6)
Potassium: 3.9 mmol/L (ref 3.5–5.1)
Sodium: 133 mmol/L — ABNORMAL LOW (ref 135–145)

## 2020-11-17 LAB — GLUCOSE, CAPILLARY
Glucose-Capillary: 172 mg/dL — ABNORMAL HIGH (ref 70–99)
Glucose-Capillary: 147 mg/dL — ABNORMAL HIGH (ref 70–99)
Glucose-Capillary: 133 mg/dL — ABNORMAL HIGH (ref 70–99)
Glucose-Capillary: 110 mg/dL — ABNORMAL HIGH (ref 70–99)
Glucose-Capillary: 136 mg/dL — ABNORMAL HIGH (ref 70–99)

## 2020-11-17 LAB — APTT: aPTT: 70 seconds — ABNORMAL HIGH (ref 24–36)

## 2020-11-17 LAB — HEPARIN LEVEL (UNFRACTIONATED): Heparin Unfractionated: >1.1 IU/mL — ABNORMAL HIGH (ref 0.30–0.70)

## 2020-11-17 MED ORDER — POTASSIUM PHOSPHATES 15 MMOLE/5ML IV SOLN
30.0000 mmol | Freq: Once | INTRAVENOUS | Status: DC
  Filled 2020-11-17: qty 10

## 2020-11-17 MED ORDER — CHLORHEXIDINE GLUCONATE CLOTH 2 % EX PADS
6.0000 | MEDICATED_PAD | Freq: Every day | CUTANEOUS | Status: DC
  Administered 2020-11-18 – 2020-11-22 (×5): 6 via TOPICAL

## 2020-11-17 NOTE — Progress Notes (Addendum)
[facility] Progress Note   Subjective:   Sleeping, not responding today.   Objective Vitals:   11/16/20 1920 11/16/20 2305 11/17/20 0427 11/17/20 0743  BP: (!) 134/54 126/60 (!) 150/71 140/61  Pulse: 97 82 77   Resp: (!) 21 16 17 16   Temp: 98.6 F (37 C) (!) 97.5 F (36.4 C) (!) 97 F (36.1 C) (!) 96.9 F (36.1 C)  TempSrc: Axillary Axillary Axillary Oral  SpO2: 97% 100% 97% 100%  Weight:      Height:       Physical Exam General: WDWN female, sleeping, appears comfortable Heart: RRR, no murmur Lungs: CTA anteriorly Abdomen: soft, non-distended, no guarding Extremities: no edema b/l lower extremities Dialysis Access: TDC with dry, intact bandage    Additional Objective Labs: Basic Metabolic Panel: Recent Labs  Lab 11/13/20 1043 11/14/20 0751  NA 128*  128* 131*  K 3.1*  3.0* 3.7  CL 96*  95* 99  CO2 24  23 25   GLUCOSE 123*  125* 113*  BUN 9  9 8   CREATININE 3.79*  3.79* 2.78*  CALCIUM 8.0*  7.9* 7.6*  PHOS 2.2*  2.2* 1.6*   Liver Function Tests: Recent Labs  Lab 11/13/20 1043 11/14/20 0751  ALBUMIN 1.9* 1.9*   No results for input(s): LIPASE, AMYLASE in the last 168 hours. CBC: Recent Labs  Lab 11/12/20 0618 11/13/20 1100 11/14/20 0751 11/16/20 0046 11/17/20 0354  WBC 7.0 7.4 5.8 8.1 10.4  HGB 8.3* 8.4* 8.3* 8.1* 8.2*  HCT 24.7* 25.0* 25.4* 24.8* 24.5*  MCV 74.0* 73.7* 75.6* 76.8* 75.6*  PLT 89* 90* 98* 116* 159   Blood Culture    Component Value Date/Time   SDES BLOOD LEFT ANTECUBITAL 11/09/2020 1734   SPECREQUEST  11/09/2020 1734    BOTTLES DRAWN AEROBIC AND ANAEROBIC Blood Culture adequate volume   CULT  11/09/2020 1734    NO GROWTH 5 DAYS Performed at [facility], [place] [address], [place], [place] [address]    REPTSTATUS 11/14/2020 FINAL 11/09/2020 1734    Cardiac Enzymes: No results for input(s): CKTOTAL, CKMB, CKMBINDEX, TROPONINI in the last 168 hours. CBG: Recent Labs  Lab 11/16/20 1622  11/16/20 1918 11/16/20 2305 11/17/20 0444 11/17/20 0740  GLUCAP 198* 196* 224* 172* 147*   Iron Studies: No results for input(s): IRON, TIBC, TRANSFERRIN, FERRITIN in the last 72 hours. @lablastinr3 @ Studies/Results: No results found. Medications:  sodium chloride     heparin 500 Units/hr (11/17/20 0547)    sodium chloride   Intravenous Once   Chlorhexidine Gluconate Cloth  6 each Topical Q0600   darbepoetin (ARANESP) injection - DIALYSIS  200 mcg Intravenous Q Tue-HD   feeding supplement (NEPRO CARB STEADY)  237 mL Oral TID BM   mouth rinse  15 mL Mouth Rinse BID   multivitamin  1 tablet Oral QHS   sodium chloride flush  10-40 mL Intracatheter Q12H   thiamine injection  100 mg Intravenous Daily    Dialysis Orders: Center: [facility]  on TTS . 180NRe, 4 hours, BFR 400, DFR 500, EDW 77kg 4K, 2.25Ca, TDC Heparin 6000 unit bolus Mircera 240mcg IV q 2 weeks- last dose 10/31/20 Hectorol 59mcg IV q HD  Assessment/Plan: Syncope/PE: Hypotensive on arrival despite no UF with HD, given 1.5L IVF total. CTA showed PE. LE US performed (+) DVT left common femoral vein. Started on heparin   ESRD:  TTS schedule. Next HD tomorrow.   Hypertension/volume: Hypotensive on arrival, given 1.5L UF and now appears  close to euvolemic, no edema on CT. Blood pressure improved however weights are variable. UF as tolerated with HD  Anemia: S/p 1 unit PRBC 10/22 for Hgb 7-now 8.2 and stable. Monitor trend. Pt was on max ESA outpatient and last dose was on 10/11-now on aranesp here q Tuesday. Give blood transfusion as needed.  Metabolic bone disease: Calcium controlled, last phos was 1.6- ordered a dose of k phos neutral but patient refused. Rechecking RFP this AM.  Dispo: Patient does not seem to be making much progress and is refusing most meds including eliquis. Palliative care is planning a family meeting tomorrow.   Anice Paganini, PA-C 11/17/2020, 10:04 AM  [facility] Pager: [phone]  I have seen and examined this patient and agree with plan and assessment in the above note with renal recommendations/intervention highlighted. Pt is nonverbal and unresponsive to voice or tactile stimuli.  Palliative care meeting tomorrow.  Poor overall prognosis.  Broadus John A Kamarii Buren,MD 11/17/2020 10:23 AM

## 2020-11-17 NOTE — Progress Notes (Signed)
ANTICOAGULATION CONSULT NOTE - Follow-up Consult  Pharmacy Consult for apixaban>heparin Indication: pulmonary embolus  Allergies  Allergen Reactions   Morphine And Related Other (See Comments)    Family request not to be given, reports pt does not wake up when given    Promethazine Hcl Other (See Comments)    REACTION: lethargy    Patient Measurements: Height: 5\' 4"  (162.6 cm) Weight: 78 kg (171 lb 15.3 oz) IBW/kg (Calculated) : 54.7 Heparin Dosing Weight: 77.6 kg  Vital Signs: Temp: 96.9 F (36.1 C) (10/28 0743) Temp Source: Oral (10/28 0743) BP: 140/61 (10/28 0743) Pulse Rate: 77 (10/28 0427)  Labs: Recent Labs    11/15/20 2308 11/16/20 0046 11/17/20 0354  HGB  --  8.1* 8.2*  HCT  --  24.8* 24.5*  PLT  --  116* 159  APTT 150* 136* 70*  HEPARINUNFRC  --   --  >1.10*     Estimated Creatinine Clearance: 17.7 mL/min (A) (by C-G formula based on SCr of 2.78 mg/dL (H)).   Medical History: Past Medical History:  Diagnosis Date   Adenomatous colon polyp    Allergy    Anemia    Asthma        CAD (coronary artery disease)    Mild very minimal coronary disease with 20% obtuse marginal stenosis   Carpal tunnel syndrome on left    Cataract    CHF (congestive heart failure) (HCC)    Chronic kidney disease (CKD), stage III (moderate) (HCC)    now stage 4   COPD (chronic obstructive pulmonary disease) ([place])    Dementia with behavioral disturbance 03/09/2020   Depression    Diabetes mellitus 1997   Type II    Diverticulosis    Dyspnea    Elevated diaphragm November 2011   Right side   Esophageal dysmotility    Esophageal stricture    ESRD on hemodialysis ([place]) 03/09/2020   Gastritis    Gastroparesis 08/21/2007   GERD (gastroesophageal reflux disease)    Hearing loss of both ears    Hernia, hiatal    Hyperlipidemia    Hypertension    Morbid obesity ([place])    OSTEOARTHRITIS 08/09/2006   Osteoporosis    Oxygen deficiency    prn    PERIPHERAL NEUROPATHY, FEET  09/23/2007   RENAL INSUFFICIENCY 02/16/2009   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 Pharmacy consulted for heparin dosing.   Patient transitioned to apixaban 10/24, now orders to transition back to heparin with noncompliance and med refusal. Last dose of apixaban was 10/25 at 1435. Will need to monitor heparin using aptt checks until they correlate with daily heparin levels.   Difficult stick given restricted access d/t hemodialysis. Heparin rate adjusted yesterday 10/27, aptt is now within goal at 70s, heparin level elevated at >1 as expected. No bleeding issues noted.   If levels grossly out of range tomorrow would consider rechecking by foot or if possible have labs drawn prior to HD.   Goal of Therapy:  Aptt goal 66-102s Heparin level 0.3-0.7 units/ml Monitor platelets by anticoagulation protocol: Yes   Plan:  Continue heparin at 500 units/hr Recheck aptt in am Heparin  level and CBC daily while on heparin  Erin Hearing PharmD., BCPS Clinical Pharmacist 11/17/2020 8:16 AM

## 2020-11-17 NOTE — Progress Notes (Signed)
Patient refusing IV start at this time.

## 2020-11-17 NOTE — Progress Notes (Signed)
Date and time results received: 11/17/20 1144 (use smartphrase ".now" to insert current time)  Test: Phosphorus  Critical Value: <1.0  Name of Provider Notified: Bonnielee Haff MD  Orders Received? Or Actions Taken?: No new orders.

## 2020-11-17 NOTE — Progress Notes (Signed)
Unable to administer potassium phosphate that has been ordered. Pt only has one PIV. Uncooperative with insertion of 2nd IV. Medication already infusing is not compatible with potassium phosphate. Nephrology notified.

## 2020-11-17 NOTE — Progress Notes (Signed)
PROGRESS NOTE    Joann Gutierrez  DXA:[id number] DOB: 05-21-1945 DOA: 11/09/2020  PCP: Andree Moro, DO    Brief Narrative: 75 year old with past medical history significant for ESRD on hemodialysis TTS, anemia, asthma, CAD, chronic diastolic heart failure, pulmonary hypertension, COPD advanced dementia, chronic ambulatory dysfunction, depression, type 2 diabetes, seizure, chronic respiratory failure on home oxygen, OSA on CPAP, CVA presented to the ED via EMS from her hemodialysis center after she completed full treatment, no fluid was removed.  After patient completed treatment she had a witnessed syncopal episode with seizure-like activity.  Brief episode of apnea, chest compressions started after which patient woke up.  Hypoglycemic CBG 53.  She was also hypotensive 53/27 when EMS arrived.  She received IV bolus.  In the ED blood pressure was 82/54.  Patient received IV fluids.  However patient has had very poor oral intake and decreased mentation over the last several days.  Palliative care was consulted.    Assessment & Plan:   Acute pulmonary embolism and left upper lobe, No right side heart strain per CT -Patient initially presented with worsening hypoxemia and hypotension. -Troponin not significantly elevated at 25 and 27. -Patient was started on IV heparin.  Seen by critical care medicine.  Echocardiogram showed normal right ventricular function.  Transitioned to Eliquis on 10/24. Due to extremely poor oral intake patient has not been taking Eliquis consistently.  Patient was transitioned back to IV heparin on 10/26.  Palliative care to have a family meeting on Friday.  SIRS: Sepsis ruled out.  Patient presented with hypotension, tachycardia, lactic acidosis with lactic acid at 8, leukocytosis. Related to hypotension, hypoperfusion, from PE. Completed Cefepime for 5 days.  Blood cultures: No growth to date.  CT chest Negative for pneumonia, CT abdomen no evidence of infectious  process. Pro-calcitonin elevated.  Discontinued vancomycin and flagyl 10/22.  Acute on chronic hypoxic respiratory failure: Initially required oxygen presumably due to PE.  Now on room air.    Diabetes mellitus type 2 with episodes of hypoglycemia  Not on insulin or hypoglycemic agent.  Low glucose levels likely due to poor oral intake.  Did require D10 infusion briefly.  CBGs have improved.  Looks like likely demand ischemia from PE.  She did manage to eat 25% of 2 meals yesterday.  Mild troponin elevation Likely demand, PE, mildly elevation  Microcytic Anemia/thrombocytopenia Patient did require 1 unit of PRBC for hemoglobin of 7.0.  No evidence of overt bleeding noted.  Hemoglobin noted to be stable. Low platelet count likely due to consumption.  Has improved.  ESRD on hemodialysis TTS Nephrology following.  Chronic diastolic heart failure: Volume management with dialysis COPD: Continue with DuoNeb OSA: Continue with CPAP  Dementia: Delirium: Patient gets agitated at times and sometimes hits staff. Delirium precaution. CT head on admission negative. EEG negative for seizure.  Appears to have waxing and waning mentation.  Questionable seizure: Likely related to hypoperfusion rather than real seizure EEG Ordered.  Goals of care Patient has been gradually declining with poor oral intake.  Oral intake did get better yesterday but again she is lethargic today.  Palliative care to have family meeting this afternoon.  Prognosis is poor.    DVT prophylaxis: Heparin drip Code Status: Full code Family Communication: No family at bedside. Disposition Plan: Unclear for now.  Status is: Inpatient  Remains inpatient appropriate because: Monitor oral intake, CBG for hypoglycemia. Might need SNF     Consultants:  Nephrology Palliative care  Procedures:  Echo Doppler lower extremity  Antimicrobials:    Subjective: Patient lethargic today.  Does not open her  eyes.  Objective: Vitals:   11/16/20 1920 11/16/20 2305 11/17/20 0427 11/17/20 0743  BP: (!) 134/54 126/60 (!) 150/71 140/61  Pulse: 97 82 77   Resp: (!) 21 16 17 16   Temp: 98.6 F (37 C) (!) 97.5 F (36.4 C) (!) 97 F (36.1 C) (!) 96.9 F (36.1 C)  TempSrc: Axillary Axillary Axillary Oral  SpO2: 97% 100% 97% 100%  Weight:      Height:        Intake/Output Summary (Last 24 hours) at 11/17/2020 1125 Last data filed at 11/16/2020 1500 Gross per 24 hour  Intake 355.98 ml  Output 2000 ml  Net -1644.02 ml    Filed Weights   11/14/20 1128 11/16/20 0725 11/16/20 1145  Weight: 77.9 kg 80.2 kg 78 kg    Examination:  General appearance: Asleep.  Does not wake up.  Does groan occasionally. Resp: Clear to auscultation bilaterally.  Normal effort Cardio: S1-S2 is normal regular.  No S3-S4.  No rubs murmurs or bruit GI: Abdomen is soft.  Nontender nondistended.  Bowel sounds are present normal.  No masses organomegaly   Data Reviewed: I have personally reviewed following labs and imaging studies  CBC: Recent Labs  Lab 11/12/20 0618 11/13/20 1100 11/14/20 0751 11/16/20 0046 11/17/20 0354  WBC 7.0 7.4 5.8 8.1 10.4  HGB 8.3* 8.4* 8.3* 8.1* 8.2*  HCT 24.7* 25.0* 25.4* 24.8* 24.5*  MCV 74.0* 73.7* 75.6* 76.8* 75.6*  PLT 89* 90* 98* 116* 540    Basic Metabolic Panel: Recent Labs  Lab 11/13/20 1043 11/14/20 0751  NA 128*  128* 131*  K 3.1*  3.0* 3.7  CL 96*  95* 99  CO2 24  23 25   GLUCOSE 123*  125* 113*  BUN 9  9 8   CREATININE 3.79*  3.79* 2.78*  CALCIUM 8.0*  7.9* 7.6*  PHOS 2.2*  2.2* 1.6*    GFR: Estimated Creatinine Clearance: 17.7 mL/min (A) (by C-G formula based on SCr of 2.78 mg/dL (H)). Liver Function Tests: Recent Labs  Lab 11/13/20 1043 11/14/20 0751  ALBUMIN 1.9* 1.9*     Coagulation Profile: Recent Labs  Lab 11/10/20 1912  INR 1.7*     CBG: Recent Labs  Lab 11/16/20 1622 11/16/20 1918 11/16/20 2305 11/17/20 0444  11/17/20 0740  GLUCAP 198* 196* 224* 172* 147*      Recent Results (from the past 240 hour(s))  Blood culture (routine x 2)     Status: None   Collection Time: 11/09/20  5:34 PM   Specimen: BLOOD  Result Value Ref Range Status   Specimen Description BLOOD LEFT ANTECUBITAL  Final   Special Requests   Final    BOTTLES DRAWN AEROBIC AND ANAEROBIC Blood Culture adequate volume   Culture   Final    NO GROWTH 5 DAYS Performed at [facility], [place] [address], [place], [place] [address]    Report Status 11/14/2020 FINAL  Final  Resp Panel by RT-PCR (Flu A&B, Covid) Nasopharyngeal Swab     Status: None   Collection Time: 11/09/20  7:24 PM   Specimen: Nasopharyngeal Swab; Nasopharyngeal(NP) swabs in vial transport medium  Result Value Ref Range Status   SARS Coronavirus 2 by RT PCR NEGATIVE NEGATIVE Final    Comment: (NOTE) SARS-CoV-2 target nucleic acids are NOT DETECTED.  The SARS-CoV-2 RNA is generally detectable in upper respiratory specimens during the  acute phase of infection. The lowest concentration of SARS-CoV-2 viral copies this assay can detect is 138 copies/mL. A negative result does not preclude SARS-Cov-2 infection and should not be used as the sole basis for treatment or other patient management decisions. A negative result may occur with  improper specimen collection/handling, submission of specimen other than nasopharyngeal swab, presence of viral mutation(s) within the areas targeted by this assay, and inadequate number of viral copies(<138 copies/mL). A negative result must be combined with clinical observations, patient history, and epidemiological information. The expected result is Negative.  Fact Sheet for Patients:  EntrepreneurPulse.com.au  Fact Sheet for Healthcare Providers:  IncredibleEmployment.be  This test is no t yet approved or cleared by the [place] FDA and  has been authorized for detection and/or  diagnosis of SARS-CoV-2 by FDA under an Emergency Use Authorization (EUA). This EUA will remain  in effect (meaning this test can be used) for the duration of the COVID-19 declaration under Section 564(b)(1) of the Act, 21 U.S.C.section 360bbb-3(b)(1), unless the authorization is terminated  or revoked sooner.       Influenza A by PCR NEGATIVE NEGATIVE Final   Influenza B by PCR NEGATIVE NEGATIVE Final    Comment: (NOTE) The Xpert Xpress SARS-CoV-2/FLU/RSV plus assay is intended as an aid in the diagnosis of influenza from Nasopharyngeal swab specimens and should not be used as a sole basis for treatment. Nasal washings and aspirates are unacceptable for Xpert Xpress SARS-CoV-2/FLU/RSV testing.  Fact Sheet for Patients: EntrepreneurPulse.com.au  Fact Sheet for Healthcare Providers: IncredibleEmployment.be  This test is not yet approved or cleared by the [place] FDA and has been authorized for detection and/or diagnosis of SARS-CoV-2 by FDA under an Emergency Use Authorization (EUA). This EUA will remain in effect (meaning this test can be used) for the duration of the COVID-19 declaration under Section 564(b)(1) of the Act, 21 U.S.C. section 360bbb-3(b)(1), unless the authorization is terminated or revoked.  Performed at [facility], [place] [address], [place], [place] [address]           Radiology Studies: No results found.    Scheduled Meds:  sodium chloride   Intravenous Once   Chlorhexidine Gluconate Cloth  6 each Topical Q0600   darbepoetin (ARANESP) injection - DIALYSIS  200 mcg Intravenous Q Tue-HD   feeding supplement (NEPRO CARB STEADY)  237 mL Oral TID BM   mouth rinse  15 mL Mouth Rinse BID   multivitamin  1 tablet Oral QHS   sodium chloride flush  10-40 mL Intracatheter Q12H   thiamine injection  100 mg Intravenous Daily   Continuous Infusions:  sodium chloride     heparin 500 Units/hr (11/17/20 0547)      LOS: 8 days     Bonnielee Haff, MD [facility]   If 7PM-7AM, please contact night-coverage [email or web address]  11/17/2020, 11:25 AM

## 2020-11-17 NOTE — Progress Notes (Signed)
Daily Progress Note   Patient Name: Joann Gutierrez       Date: 11/17/2020 DOB: Mar 09, 1945  Age: 75 y.o. MRN#: [id number] Attending Physician: Bonnielee Haff, MD Primary Care Physician: Andree Moro, DO Admit Date: 11/09/2020  Reason for Consultation/Follow-up: Establishing goals of care  Subjective: Medical records reviewed. Patient assessed at the bedside. She states "what" to me however does not respond further or answer my questions.   I met with patient's sister, son, partner, and son's companion for a planned family meeting to discuss goals of care. Provided update on patient's poor nutritional status, complicated by continued refusal to eat and agitation. Patient's sister and son tell me that she was speaking with them last night and seems to be selectively refusing staff. They encourage her to participate but unsuccessfully.    We discussed the option of pursuing aggressive interventions such as artifical nutrition, risks and benefits of NG/PEG, and great concern that this would cause more burden than benefit at this stage in Joann Gutierrez's life. Discussed the risks and benefits of CPR in detail. Patient's family verbalize their understanding and agree that artificial nutrition would not add joy or meaning to Joann Gutierrez's life. They are hesitant about committing to DNR, but do feel that they would accept when God calls her home if it is her time to pass.  I then reviewed the alternative option of proceeding with comfort feeds and comfort-focused care. Counseled on hospice philosophy and available strategies/medications to support patient as her natural disease process progressed. This would include discontinuation of dialysis and life-prolonging interventions. Patient's family verbalize their  appreciation and share that this would be more aligned with her wishes if she is unable to eat enough to sustain herself in the coming days.  Questions and concerns addressed. PMT will continue to support holistically.  Length of Stay: 8  Current Medications: Scheduled Meds:   sodium chloride   Intravenous Once   Chlorhexidine Gluconate Cloth  6 each Topical Q0600   darbepoetin (ARANESP) injection - DIALYSIS  200 mcg Intravenous Q Tue-HD   feeding supplement (NEPRO CARB STEADY)  237 mL Oral TID BM   mouth rinse  15 mL Mouth Rinse BID   multivitamin  1 tablet Oral QHS   sodium chloride flush  10-40 mL Intracatheter Q12H   thiamine injection  100 mg  Intravenous Daily    Continuous Infusions:  sodium chloride     heparin 500 Units/hr (11/17/20 0547)    PRN Meds: sodium chloride, acetaminophen **OR** acetaminophen, dextrose, haloperidol lactate, ipratropium-albuterol, sodium chloride flush  Physical Exam Vitals and nursing note reviewed.  Constitutional:      General: She is not in acute distress.    Appearance: She is ill-appearing.  Cardiovascular:     Rate and Rhythm: Normal rate.  Pulmonary:     Effort: Pulmonary effort is normal.  Neurological:     Mental Status: She is disoriented.            Vital Signs: BP 131/70 (BP Location: Left Leg)   Pulse 77   Temp (!) 96.9 F (36.1 C) (Oral)   Resp 17   Ht '5\' 4"'  (1.626 m)   Wt 78 kg   SpO2 100%   BMI 29.52 kg/m  SpO2: SpO2: 100 % O2 Device: O2 Device: Room Air O2 Flow Rate: O2 Flow Rate (L/min): 4 L/min  Intake/output summary:  Intake/Output Summary (Last 24 hours) at 11/17/2020 1405 Last data filed at 11/16/2020 1500 Gross per 24 hour  Intake 295.98 ml  Output --  Net 295.98 ml   LBM:   Baseline Weight: Weight: 99 kg Most recent weight: Weight: 78 kg       Palliative Assessment/Data: 20%      Patient Active Problem List   Diagnosis Date Noted   Pressure injury of skin 11/12/2020   Syncope  11/10/2020   Hypotension 11/10/2020   Hypoglycemia 11/10/2020   Severe sepsis ([place]) 11/09/2020   Delirium 04/10/2020   Adult failure to thrive 04/10/2020   Acute bilat watershed infarction [facility]) 03/31/2020   AMS (altered mental status) 16/10/9602   Acute metabolic encephalopathy 54/09/8117   ESRD on hemodialysis ([place]) 03/09/2020   Thrombocytopenia ([place]) 03/09/2020   Dementia with behavioral disturbance 03/09/2020   COVID-19    SOB (shortness of breath)    Type 2 diabetes mellitus with hyperlipidemia ([place])    Sepsis ([place]) 02/16/2020   Acute on chronic respiratory failure ([place]) 02/16/2020   Edema of left lower leg 02/16/2020   Pneumonia due to COVID-19 virus 02/15/2020   Acute on chronic diastolic HF (heart failure) ([place]) 11/18/2019   Dyslipidemia 11/18/2019   Polypharmacy 04/13/2019   Healthcare maintenance 10/15/2017   ARF (acute renal failure) ([place]) 09/07/2017   Chest pain 09/05/2017   Ineffective health maintenance 06/26/2017   Medication management 06/26/2017   Retinal edema 10/07/2016   Postherpetic neuralgia 12/21/2015   Herpes zoster with complication 14/78/2956   Cellulitis of face 11/27/2015   Anemia of chronic disease 08/17/2015   History of colonic polyps    Hypertensive emergency 05/20/2015   Hypokalemia 05/20/2015   AKI (acute kidney injury) ([place])    Esophageal dysmotility 03/01/2015   Throat congestion 10/11/2014   Morbid obesity due to excess calories ([place]) 09/23/2014   Acute asthmatic bronchitis 05/20/2014   Abdominal pain, other specified site 09/13/2013   Elevated liver enzymes 09/13/2013   Cough 05/05/2013   Pain in lower limb 03/05/2013   DOE (dyspnea on exertion) 02/03/2013   Chronic respiratory failure ([place]) 12/13/2012   Stricture and stenosis of esophagus 08/31/2012   Onychomycosis 06/01/2012   Pain in joint, ankle and foot 06/01/2012   Diabetes mellitus type 2 in obese ([place]) 04/29/2012   Essential hypertension 07/19/2011   CKD (chronic kidney  disease), stage III ([place]) 05/19/2011   Family history of malignant neoplasm of gastrointestinal tract 10/30/2010  Bloating 10/16/2010   Epigastric pain 10/16/2010   Foot pain 09/25/2010   Dysphagia 07/16/2010   GERD (gastroesophageal reflux disease) 07/16/2010   Microcytic anemia 07/16/2010   Obstructive sleep apnea 06/16/2009   HYPERCHOLESTEROLEMIA 02/16/2009   PERIPHERAL NEUROPATHY, FEET 09/23/2007   Gastroparesis 08/21/2007   Chronic obstructive asthma ([place]) 08/09/2006    Palliative Care Assessment & Plan   Patient Profile: Per intake H&P --> 66 year old with past medical history significant for ESRD on hemodialysis TTS, anemia, asthma, CAD, chronic diastolic heart failure, pulmonary hypertension, COPD advanced dementia, chronic ambulatory dysfunction, depression, type 2 diabetes, seizure, chronic respiratory failure on home oxygen, OSA on CPAP, CVA. Was admitted post hemodialysis after witnessed syncopal event with suspected seizure like activity.    Palliative care was asked to get involved to discuss goals of care in the setting of multiple co-morbid conditions.  Assessment: ESRD FTT CHF COPD Advanced dementia Goals of care conversation  Recommendations/Plan: Full code, ongoing discussions Patient's family has decided NO feeding tube, they do wish to continue watchful waiting and encouraging PO intake over the weekend  F/u [place] meeting scheduled for 10/30 at 2pm to discuss comfort care and hospice further if she is unable to meet nutritional needs with PO intake Ongoing support from PMT  Goals of Care and Additional Recommendations: Limitations on Scope of Treatment: No Artificial Feeding  Prognosis:  Very worrisome in the setting of ongoing FTT, Hypoalbuminemia, ESRD, COPD dHF has an increased mortality risk.  Discharge Planning: To Be Determined  Care plan was discussed with Patient's son, sister, partner, Dr. Manfred Arch  Time in: 1:15pm Time out: 2:25pm Total  time: 70 minutes Prolonged time: Yes Greater than 50% of this time was spent in counseling and coordinating care related to the above assessment and plan.  Dorthy Cooler, PA-C Palliative Medicine Team Team phone # [phone]  Thank you for allowing the Palliative Medicine Team to assist in the care of this patient. Please utilize secure chat with additional questions, if there is no response within 30 minutes please call the above phone number.  Palliative Medicine Team providers are available by phone from 7am to 7pm daily and can be reached through the team cell phone.  Should this patient require assistance outside of these hours, please call the patient's attending physician.

## 2020-11-17 NOTE — Progress Notes (Signed)
Occupational Therapy Treatment Patient Details Name: Joann Gutierrez MRN: [id number] DOB: 01-22-1945 Today's Date: 11/17/2020   History of present illness 75 yo female presenting to ED on 10/20 with syncopal/seizure episode at dialysis center. Chest compressions started and quickly stopped after patient woke up. Head CT negative. PMH including arthma, CHF, DM, depression, dementia with behavioral disturbances, HTN, L TKA (1998), and AV fistula placement (2020).   OT comments  Gently awakened pt to work on self feeding and grooming at bed level. +2 total assist required to position pt up in bed. Pt agitated and attempting to hit OT with approach of straw toward her mouth to elicit drinking Nepro or wash face/wipe mouth. Spitting out saliva and refusing anything by mouth. Repositioned pt.  Updated d/c recommendations and frequency to reflect rehab potential.    Recommendations for follow up therapy are one component of a multi-disciplinary discharge planning process, led by the attending physician.  Recommendations may be updated based on patient status, additional functional criteria and insurance authorization.    Follow Up Recommendations  No OT follow up    Assistance Recommended at Discharge Frequent or [facility] bed (hoyer lift)    Recommendations for Other Services      Precautions / Restrictions Precautions Precautions: Fall Precaution Comments: can be aggressive       Mobility Bed Mobility Overal bed mobility: Needs Assistance             General bed mobility comments: +2 total assist to pull up in bed prior to attempting to eat    Transfers                   General transfer comment: recommend lift     Balance                                           ADL either performed or assessed with clinical judgement   ADL                                          General ADL Comments: total assist to attempt to feed or wipe face     Vision       Perception     Praxis      Cognition Arousal/Alertness: Awake/alert Behavior During Therapy: Flat affect Overall Cognitive Status: History of cognitive impairments - at baseline                                 General Comments: aggressive with swinging at OT when offering straw for Nepro or spoon          Exercises     Shoulder Instructions       General Comments      Pertinent Vitals/ Pain       Pain Assessment: Faces Faces Pain Scale: No hurt  Home Living                                          Prior Functioning/Environment  Frequency  Min 1X/week        Progress Toward Goals  OT Goals(current goals can now be found in the care plan section)  Progress towards OT goals: Not progressing toward goals - comment  Acute Rehab OT Goals OT Goal Formulation: Patient unable to participate in goal setting Time For Goal Achievement: 11/28/20 Potential to Achieve Goals: [place] Frequency needs to be updated;Discharge plan needs to be updated    Co-evaluation                 AM-PAC OT "6 Clicks" Daily Activity     Outcome Measure   Help from another person eating meals?: Total Help from another person taking care of personal grooming?: Total Help from another person toileting, which includes using toliet, bedpan, or urinal?: Total Help from another person bathing (including washing, rinsing, drying)?: Total Help from another person to put on and taking off regular upper body clothing?: Total Help from another person to put on and taking off regular lower body clothing?: Total 6 Click Score: 6    End of Session    OT Visit Diagnosis: Unsteadiness on feet (R26.81);Other abnormalities of gait and mobility (R26.89);Muscle weakness (generalized) (M62.81)   Activity Tolerance Treatment limited secondary to  agitation   Patient Left in bed;with call bell/phone within reach;with bed alarm set   Nurse Communication          Time: [phone] OT Time Calculation (min): 17 min  Charges: OT General Charges $OT Visit: 1 Visit OT Treatments $Self Care/Home Management : 8-22 mins  Nestor Lewandowsky, OTR/L Acute Rehabilitation Services Pager: [phone] Office: [phone]   Malka So 11/17/2020, 12:51 PM

## 2020-11-17 NOTE — Care Management Important Message (Signed)
Important Message  Patient Details  Name: EVOLETH NORDMEYER MRN: [id number] Date of Birth: [birth year]   Medicare Important Message Given:  Yes     [name] 11/17/2020, 11:17 AM

## 2020-11-18 DIAGNOSIS — R4182 Altered mental status, unspecified: Secondary | ICD-10-CM | POA: Diagnosis not present

## 2020-11-18 DIAGNOSIS — Z7189 Other specified counseling: Secondary | ICD-10-CM | POA: Diagnosis not present

## 2020-11-18 DIAGNOSIS — J9621 Acute and chronic respiratory failure with hypoxia: Secondary | ICD-10-CM | POA: Diagnosis not present

## 2020-11-18 DIAGNOSIS — N186 End stage renal disease: Secondary | ICD-10-CM | POA: Diagnosis not present

## 2020-11-18 DIAGNOSIS — I2699 Other pulmonary embolism without acute cor pulmonale: Secondary | ICD-10-CM | POA: Diagnosis not present

## 2020-11-18 LAB — GLUCOSE, CAPILLARY
Glucose-Capillary: 110 mg/dL — ABNORMAL HIGH (ref 70–99)
Glucose-Capillary: 117 mg/dL — ABNORMAL HIGH (ref 70–99)
Glucose-Capillary: 76 mg/dL (ref 70–99)
Glucose-Capillary: 81 mg/dL (ref 70–99)

## 2020-11-18 LAB — CBC
HCT: 25.4 % — ABNORMAL LOW (ref 36.0–46.0)
Hemoglobin: 8.3 g/dL — ABNORMAL LOW (ref 12.0–15.0)
MCH: 25.2 pg — ABNORMAL LOW (ref 26.0–34.0)
MCHC: 32.7 g/dL (ref 30.0–36.0)
MCV: 77 fL — ABNORMAL LOW (ref 80.0–100.0)
Platelets: 162 10*3/uL (ref 150–400)
RBC: 3.3 MIL/uL — ABNORMAL LOW (ref 3.87–5.11)
RDW: 26.1 % — ABNORMAL HIGH (ref 11.5–15.5)
WBC: 11.2 10*3/uL — ABNORMAL HIGH (ref 4.0–10.5)
nRBC: 1.2 % — ABNORMAL HIGH (ref 0.0–0.2)

## 2020-11-18 LAB — APTT: aPTT: 53 seconds — ABNORMAL HIGH (ref 24–36)

## 2020-11-18 LAB — HEPARIN LEVEL (UNFRACTIONATED): Heparin Unfractionated: 0.81 IU/mL — ABNORMAL HIGH (ref 0.30–0.70)

## 2020-11-18 MED ORDER — APIXABAN 5 MG PO TABS
10.0000 mg | ORAL_TABLET | Freq: Two times a day (BID) | ORAL | Status: DC
  Filled 2020-11-18: qty 2

## 2020-11-18 MED ORDER — HEPARIN SODIUM (PORCINE) 1000 UNIT/ML IJ SOLN
INTRAMUSCULAR | Status: AC
  Filled 2020-11-18: qty 4

## 2020-11-18 MED ORDER — ENOXAPARIN SODIUM 60 MG/0.6ML IJ SOSY
60.0000 mg | PREFILLED_SYRINGE | INTRAMUSCULAR | Status: DC
  Administered 2020-11-18 – 2020-11-22 (×5): 60 mg via SUBCUTANEOUS
  Filled 2020-11-18 (×5): qty 0.6

## 2020-11-18 MED ORDER — APIXABAN 5 MG PO TABS: 5.0000 mg | ORAL_TABLET | Freq: Two times a day (BID) | ORAL | Status: DC

## 2020-11-18 NOTE — H&P (Signed)
Pt currently not using cpap. Pt will not answer any questions asked about cpap.

## 2020-11-18 NOTE — Progress Notes (Addendum)
ANTICOAGULATION CONSULT NOTE - Follow-up Consult  Pharmacy Consult for apixaban/heparin Indication: pulmonary embolus  Allergies  Allergen Reactions   Morphine And Related Other (See Comments)    Family request not to be given, reports pt does not wake up when given    Promethazine Hcl Other (See Comments)    REACTION: lethargy    Patient Measurements: Height: 5\' 4"  (162.6 cm) Weight: 78.5 kg (173 lb 1 oz) IBW/kg (Calculated) : 54.7 Heparin Dosing Weight: 77.6 kg  Vital Signs: Temp: 98.2 F (36.8 C) (10/29 1239) Temp Source: Oral (10/29 1239) BP: 143/64 (10/29 1400) Pulse Rate: 87 (10/29 1239)  Labs: Recent Labs    11/16/20 0046 11/17/20 0354 11/17/20 1031 11/18/20 0214  HGB 8.1* 8.2*  --  8.3*  HCT 24.8* 24.5*  --  25.4*  PLT 116* 159  --  162  APTT 136* 70*  --  53*  HEPARINUNFRC  --  >1.10*  --  0.81*  CREATININE  --   --  2.67*  --      Estimated Creatinine Clearance: 18.5 mL/min (A) (by C-G formula based on SCr of 2.67 mg/dL (H)).   Medical History: Past Medical History:  Diagnosis Date   Adenomatous colon polyp    Allergy    Anemia    Asthma        CAD (coronary artery disease)    Mild very minimal coronary disease with 20% obtuse marginal stenosis   Carpal tunnel syndrome on left    Cataract    CHF (congestive heart failure) (HCC)    Chronic kidney disease (CKD), stage III (moderate) (HCC)    now stage 4   COPD (chronic obstructive pulmonary disease) ([place])    Dementia with behavioral disturbance 03/09/2020   Depression    Diabetes mellitus 1997   Type II    Diverticulosis    Dyspnea    Elevated diaphragm November 2011   Right side   Esophageal dysmotility    Esophageal stricture    ESRD on hemodialysis ([place]) 03/09/2020   Gastritis    Gastroparesis 08/21/2007   GERD (gastroesophageal reflux disease)    Hearing loss of both ears    Hernia, hiatal    Hyperlipidemia    Hypertension    Morbid obesity ([place])    OSTEOARTHRITIS 08/09/2006    Osteoporosis    Oxygen deficiency    prn    PERIPHERAL NEUROPATHY, FEET 09/23/2007   RENAL INSUFFICIENCY 02/16/2009   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   Seizures ([place])    pt thinks it has been several monthes since she had a seisure   Shingles    Sickle cell trait ([place])    Sleep apnea    uses cpap   Stroke [facility])     Assessment:  75 yo F with acute PE in L upper lobe, no RHS. Pharmacy consulted for heparin dosing.   Patient transitioned to apixaban 10/24, now orders to transition back to heparin with noncompliance and med refusal. Heparin was restarted 10/26 14:38. Heparin level was planned to be taken with HD labs today; however, that did not occur. When a stat lab place was ordered, patient became combative and refused lab draw.   Discussed with MD that enoxaparin can be used in HD patients if needed (Pon TK et al. Jacolyn Reedy Res. 2014 Jun;133(6):1023-8). For this patient, enoxaparin seems to be the best choice given that connection to a 24 hour IV line for heparin is likely to worsen her mental status and patient is refusing  to take PO medications. MD agreed with enoxaparin.  [place] meeting scheduled for 10/30 at 2PM to discuss comfort care and hospice further if she is unable to meet nutritional needs with PO intake.   Goal of Therapy:  Enoxaparin 4 hour anti-Xa level 0.5 -1 units/mL Monitor platelets by anticoagulation protocol: Yes   Plan:  Stop heparin and apixaban Start enoxaparin 60mg  Q24 hr  Order 4-hour enoxaparin anti-Xa level if continuing for more than 3 doses  F/up CBC, s/sx bleeding, GOC meeting     Benetta Spar, PharmD, BCPS, [facility] Clinical Pharmacist  Please check AMION for all [place] phone numbers After 10:00 PM, call [place] [phone]

## 2020-11-18 NOTE — Progress Notes (Signed)
PROGRESS NOTE    Joann Gutierrez  MOQ:[id number] DOB: Jul 30, 1945 DOA: 11/09/2020  PCP: Andree Moro, DO    Brief Narrative: 75 year old with past medical history significant for ESRD on hemodialysis TTS, anemia, asthma, CAD, chronic diastolic heart failure, pulmonary hypertension, COPD advanced dementia, chronic ambulatory dysfunction, depression, type 2 diabetes, seizure, chronic respiratory failure on home oxygen, OSA on CPAP, CVA presented to the ED via EMS from her hemodialysis center after she completed full treatment, no fluid was removed.  After patient completed treatment she had a witnessed syncopal episode with seizure-like activity.  Brief episode of apnea, chest compressions started after which patient woke up.  Hypoglycemic CBG 53.  She was also hypotensive 53/27 when EMS arrived.  She received IV bolus.  In the ED blood pressure was 82/54.  Patient received IV fluids.  However patient has had very poor oral intake and decreased mentation over the last several days.  Palliative care was consulted.    Assessment & Plan:   Acute pulmonary embolism and left upper lobe, No right side heart strain per CT -Patient initially presented with worsening hypoxemia and hypotension. -Troponin not significantly elevated at 25 and 27. -Patient was started on IV heparin.  Seen by critical care medicine.  Echocardiogram showed normal right ventricular function.  Transitioned to Eliquis on 10/24. Due to extremely poor oral intake patient has not been taking Eliquis consistently.  Patient was transitioned back to IV heparin on 10/26.  Respiratory status remains stable.  SIRS: Sepsis ruled out.  Patient presented with hypotension, tachycardia, lactic acidosis with lactic acid at 8, leukocytosis. Related to hypotension, hypoperfusion, from PE. Completed Cefepime for 5 days.  Blood cultures: No growth to date.  CT chest Negative for pneumonia, CT abdomen no evidence of infectious  process. Pro-calcitonin elevated.  Discontinued vancomycin and flagyl 10/22.  Acute on chronic hypoxic respiratory failure: Initially required oxygen presumably due to PE.  Now on room air.    Diabetes mellitus type 2 with episodes of hypoglycemia  Not on insulin or hypoglycemic agent.  Low glucose levels were due to poor oral intake.  Did require D10 infusion briefly.  CBGs have been stable.    Mild troponin elevation Likely demand ischemia from PE.    Microcytic Anemia/thrombocytopenia Patient did require 1 unit of PRBC for hemoglobin of 7.0.  No evidence of overt bleeding noted.  Hemoglobin noted to be stable. Low platelet count likely due to consumption.  Has improved.  ESRD on hemodialysis TTS/hypophosphatemia Nephrology following.  Significantly low phosphorus level was noted.  Intravenous phosphorus was ordered by nephrology but patient refused to have another IV placed.  Chronic diastolic heart failure: Volume management with dialysis COPD: Continue with DuoNeb OSA: Continue with CPAP  Dementia: Delirium: Patient gets agitated at times and sometimes hits staff. Delirium precaution. CT head on admission negative. EEG negative for seizure.  Appears to have waxing and waning mentation.  Questionable seizure:  Likely related to hypoperfusion rather than real seizure.  EEG was negative.  Goals of care Patient has been gradually declining with poor oral intake.  Patient's mentation has been waxing and waning but patient's oral intake remains very poor.  She has been refusing to have IV placed as well so that we could give her supplemental phosphorus.  Palliative medicine met with family yesterday.  They have agreed on not placing feeding tubes.  Another meeting to be held tomorrow to discuss transitioning to hospice/comfort care.  Considering her comorbidities and significantly poor oral intake  it would be reasonable to transition her to hospice/comfort care.  DVT prophylaxis:  Heparin drip Code Status: Full code Family Communication: No family at bedside. Disposition Plan: Unclear for now.  Status is: Inpatient  Remains inpatient appropriate because: Monitor oral intake, CBG for hypoglycemia. Might need SNF     Consultants:  Nephrology Palliative care  Procedures:  Echo Doppler lower extremity  Antimicrobials:    Subjective: Seen at dialysis.  Not very responsive as has been the case usually.  Objective: Vitals:   11/18/20 0800 11/18/20 0830 11/18/20 0900 11/18/20 0930  BP: 133/70 129/68 (!) 124/57 137/70  Pulse:      Resp: '14 19 14 14  ' Temp:      TempSrc:      SpO2:      Weight:      Height:        Intake/Output Summary (Last 24 hours) at 11/18/2020 0954 Last data filed at 11/18/2020 0500 Gross per 24 hour  Intake 250.23 ml  Output --  Net 250.23 ml    Filed Weights   11/16/20 0725 11/16/20 1145 11/18/20 0739  Weight: 80.2 kg 78 kg 79 kg    Examination:  Asleep.  Does not wake up.  Opens her eyes briefly. Lungs are clear to auscultation bilaterally.  No wheezing or rhonchi. S1-S2 is normal regular Abdomen is soft.  Nontender nondistended.  No masses organomegaly.   Data Reviewed: I have personally reviewed following labs and imaging studies  CBC: Recent Labs  Lab 11/13/20 1100 11/14/20 0751 11/16/20 0046 11/17/20 0354 11/18/20 0214  WBC 7.4 5.8 8.1 10.4 11.2*  HGB 8.4* 8.3* 8.1* 8.2* 8.3*  HCT 25.0* 25.4* 24.8* 24.5* 25.4*  MCV 73.7* 75.6* 76.8* 75.6* 77.0*  PLT 90* 98* 116* 159 889    Basic Metabolic Panel: Recent Labs  Lab 11/13/20 1043 11/14/20 0751 11/17/20 1031  NA 128*  128* 131* 133*  K 3.1*  3.0* 3.7 3.9  CL 96*  95* 99 98  CO2 '24  23 25 29  ' GLUCOSE 123*  125* 113* 131*  BUN '9  9 8 8  ' CREATININE 3.79*  3.79* 2.78* 2.67*  CALCIUM 8.0*  7.9* 7.6* 8.2*  PHOS 2.2*  2.2* 1.6* <1.0*    GFR: Estimated Creatinine Clearance: 18.5 mL/min (A) (by C-G formula based on SCr of 2.67 mg/dL  (H)). Liver Function Tests: Recent Labs  Lab 11/13/20 1043 11/14/20 0751 11/17/20 1031  ALBUMIN 1.9* 1.9* 1.9*     CBG: Recent Labs  Lab 11/17/20 1131 11/17/20 1538 11/17/20 2027 11/18/20 0035 11/18/20 0542  GLUCAP 133* 110* 136* 117* 110*      Recent Results (from the past 240 hour(s))  Blood culture (routine x 2)     Status: None   Collection Time: 11/09/20  5:34 PM   Specimen: BLOOD  Result Value Ref Range Status   Specimen Description BLOOD LEFT ANTECUBITAL  Final   Special Requests   Final    BOTTLES DRAWN AEROBIC AND ANAEROBIC Blood Culture adequate volume   Culture   Final    NO GROWTH 5 DAYS Performed at [facility], [place] [address] [place], [place] [address]    Report Status 11/14/2020 FINAL  Final  Resp Panel by RT-PCR (Flu A&B, Covid) Nasopharyngeal Swab     Status: None   Collection Time: 11/09/20  7:24 PM   Specimen: Nasopharyngeal Swab; Nasopharyngeal(NP) swabs in vial transport medium  Result Value Ref Range Status   SARS Coronavirus 2 by  RT PCR NEGATIVE NEGATIVE Final    Comment: (NOTE) SARS-CoV-2 target nucleic acids are NOT DETECTED.  The SARS-CoV-2 RNA is generally detectable in upper respiratory specimens during the acute phase of infection. The lowest concentration of SARS-CoV-2 viral copies this assay can detect is 138 copies/mL. A negative result does not preclude SARS-Cov-2 infection and should not be used as the sole basis for treatment or other patient management decisions. A negative result may occur with  improper specimen collection/handling, submission of specimen other than nasopharyngeal swab, presence of viral mutation(s) within the areas targeted by this assay, and inadequate number of viral copies(<138 copies/mL). A negative result must be combined with clinical observations, patient history, and epidemiological information. The expected result is Negative.  Fact Sheet for Patients:   EntrepreneurPulse.com.au  Fact Sheet for Healthcare Providers:  IncredibleEmployment.be  This test is no t yet approved or cleared by the [place] FDA and  has been authorized for detection and/or diagnosis of SARS-CoV-2 by FDA under an Emergency Use Authorization (EUA). This EUA will remain  in effect (meaning this test can be used) for the duration of the COVID-19 declaration under Section 564(b)(1) of the Act, 21 U.S.C.section 360bbb-3(b)(1), unless the authorization is terminated  or revoked sooner.       Influenza A by PCR NEGATIVE NEGATIVE Final   Influenza B by PCR NEGATIVE NEGATIVE Final    Comment: (NOTE) The Xpert Xpress SARS-CoV-2/FLU/RSV plus assay is intended as an aid in the diagnosis of influenza from Nasopharyngeal swab specimens and should not be used as a sole basis for treatment. Nasal washings and aspirates are unacceptable for Xpert Xpress SARS-CoV-2/FLU/RSV testing.  Fact Sheet for Patients: EntrepreneurPulse.com.au  Fact Sheet for Healthcare Providers: IncredibleEmployment.be  This test is not yet approved or cleared by the [place] FDA and has been authorized for detection and/or diagnosis of SARS-CoV-2 by FDA under an Emergency Use Authorization (EUA). This EUA will remain in effect (meaning this test can be used) for the duration of the COVID-19 declaration under Section 564(b)(1) of the Act, 21 U.S.C. section 360bbb-3(b)(1), unless the authorization is terminated or revoked.  Performed at [facility], [place] [address], [place], [place] [address]           Radiology Studies: No results found.    Scheduled Meds:  sodium chloride   Intravenous Once   Chlorhexidine Gluconate Cloth  6 each Topical Q0600   darbepoetin (ARANESP) injection - DIALYSIS  200 mcg Intravenous Q Tue-HD   feeding supplement (NEPRO CARB STEADY)  237 mL Oral TID BM   mouth rinse   15 mL Mouth Rinse BID   multivitamin  1 tablet Oral QHS   sodium chloride flush  10-40 mL Intracatheter Q12H   thiamine injection  100 mg Intravenous Daily   Continuous Infusions:  sodium chloride     heparin 600 Units/hr (11/18/20 0358)   potassium PHOSPHATE IVPB (in mmol)       LOS: 9 days     Bonnielee Haff, MD [facility]   If 7PM-7AM, please contact night-coverage [email or web address]  11/18/2020, 9:54 AM

## 2020-11-18 NOTE — Progress Notes (Signed)
Daily Progress Note   Patient Name: Joann Gutierrez       Date: 11/18/2020 DOB: Mar 15, 1945  Age: 75 y.o. MRN#: [id number] Attending Physician: Bonnielee Haff, MD Primary Care Physician: Andree Moro, DO Admit Date: 11/09/2020  Reason for Consultation/Follow-up: Establishing goals of care  Subjective: Medical records reviewed. Updates received from RN. Patient assessed at the bedside after returning from dialysis. She is alert and cooperative, does not answer questions or respond.  I called patient's sister to follow up on yesterday's goals of care conversation. Shared my concern that patient continues to refuse blood draws, IV placement, and does not seem to want current interventions but rather to be comfortable and in peace. Patient's sister shares that she was able to encourage patient to eat yesterday and plans to return today to continue efforts to increase PO intake. She also tells me that patient often needs anxiolytics when agitated, including during dialysis, and wonders if this would allow staff to provide necessary care. Counseled on the difficulty of balancing patient's agitation without oversedation, given the immense importance of her alertness to meet nutritional goals safety. She verbalizes understanding and confirms plan to meet again tomorrow at 2pm for further discussion of next steps.   Questions and concerns addressed. PMT will continue to support holistically.  Length of Stay: 9  Current Medications: Scheduled Meds:   sodium chloride   Intravenous Once   apixaban  10 mg Oral BID   Followed by   Derrill Memo ON 11/25/2020] apixaban  5 mg Oral BID   Chlorhexidine Gluconate Cloth  6 each Topical Q0600   darbepoetin (ARANESP) injection - DIALYSIS  200 mcg Intravenous Q  Tue-HD   feeding supplement (NEPRO CARB STEADY)  237 mL Oral TID BM   heparin sodium (porcine)       mouth rinse  15 mL Mouth Rinse BID   multivitamin  1 tablet Oral QHS   sodium chloride flush  10-40 mL Intracatheter Q12H   thiamine injection  100 mg Intravenous Daily    Continuous Infusions:  sodium chloride     potassium PHOSPHATE IVPB (in mmol)      PRN Meds: sodium chloride, acetaminophen **OR** acetaminophen, dextrose, haloperidol lactate, ipratropium-albuterol, sodium chloride flush  Physical Exam Vitals and nursing note reviewed.  Constitutional:      General: She is  not in acute distress.    Appearance: She is ill-appearing.  Cardiovascular:     Rate and Rhythm: Normal rate.  Pulmonary:     Effort: Pulmonary effort is normal.  Skin:    General: Skin is warm and dry.  Neurological:     Mental Status: She is alert. She is disoriented.            Vital Signs: BP (!) 144/69 (BP Location: Left Leg)   Pulse 87   Temp 98.2 F (36.8 C) (Oral)   Resp 13   Ht 5\' 4"  (1.626 m)   Wt 78.5 kg   SpO2 100%   BMI 29.71 kg/m  SpO2: SpO2: 100 % O2 Device: O2 Device: Room Air O2 Flow Rate: O2 Flow Rate (L/min): 4 L/min  Intake/output summary:  Intake/Output Summary (Last 24 hours) at 11/18/2020 1417 Last data filed at 11/18/2020 0500 Gross per 24 hour  Intake 250.23 ml  Output --  Net 250.23 ml    LBM:   Baseline Weight: Weight: 99 kg Most recent weight: Weight: 78.5 kg      Palliative Assessment/Data: 20%      Patient Active Problem List   Diagnosis Date Noted   Lactic acidosis    Pressure injury of skin 11/12/2020   Syncope 11/10/2020   Hypotension 11/10/2020   Hypoglycemia 11/10/2020   Severe sepsis ([place]) 11/09/2020   Delirium 04/10/2020   Adult failure to thrive 04/10/2020   Acute bilat watershed infarction [facility]) 03/31/2020   AMS (altered mental status) 19/62/2297   Acute metabolic encephalopathy 98/92/1194   ESRD on hemodialysis ([place]) 03/09/2020    Thrombocytopenia ([place]) 03/09/2020   Dementia with behavioral disturbance 03/09/2020   COVID-19    SOB (shortness of breath)    Type 2 diabetes mellitus with hyperlipidemia ([place])    Sepsis ([place]) 02/16/2020   Acute on chronic respiratory failure ([place]) 02/16/2020   Edema of left lower leg 02/16/2020   Pneumonia due to COVID-19 virus 02/15/2020   Acute on chronic diastolic HF (heart failure) ([place]) 11/18/2019   Dyslipidemia 11/18/2019   Polypharmacy 04/13/2019   Goals of care, counseling/discussion 10/15/2017   ARF (acute renal failure) ([place]) 09/07/2017   Chest pain 09/05/2017   Ineffective health maintenance 06/26/2017   Medication management 06/26/2017   Retinal edema 10/07/2016   Postherpetic neuralgia 12/21/2015   Herpes zoster with complication 17/40/8144   Cellulitis of face 11/27/2015   Anemia of chronic disease 08/17/2015   History of colonic polyps    Hypertensive emergency 05/20/2015   Hypokalemia 05/20/2015   AKI (acute kidney injury) ([place])    Esophageal dysmotility 03/01/2015   Throat congestion 10/11/2014   Morbid obesity due to excess calories ([place]) 09/23/2014   Acute asthmatic bronchitis 05/20/2014   Abdominal pain, other specified site 09/13/2013   Elevated liver enzymes 09/13/2013   Cough 05/05/2013   Pain in lower limb 03/05/2013   DOE (dyspnea on exertion) 02/03/2013   Chronic respiratory failure ([place]) 12/13/2012   Stricture and stenosis of esophagus 08/31/2012   Onychomycosis 06/01/2012   Pain in joint, ankle and foot 06/01/2012   Diabetes mellitus type 2 in obese ([place]) 04/29/2012   Essential hypertension 07/19/2011   CKD (chronic kidney disease), stage III ([place]) 05/19/2011   Family history of malignant neoplasm of gastrointestinal tract 10/30/2010   Bloating 10/16/2010   Epigastric pain 10/16/2010   Foot pain 09/25/2010   Dysphagia 07/16/2010   GERD (gastroesophageal reflux disease) 07/16/2010   Microcytic anemia 07/16/2010   Obstructive sleep  apnea  06/16/2009   HYPERCHOLESTEROLEMIA 02/16/2009   PERIPHERAL NEUROPATHY, FEET 09/23/2007   Gastroparesis 08/21/2007   Chronic obstructive asthma ([place]) 08/09/2006    Palliative Care Assessment & Plan   Patient Profile: Per intake H&P --> 23 year old with past medical history significant for ESRD on hemodialysis TTS, anemia, asthma, CAD, chronic diastolic heart failure, pulmonary hypertension, COPD advanced dementia, chronic ambulatory dysfunction, depression, type 2 diabetes, seizure, chronic respiratory failure on home oxygen, OSA on CPAP, CVA. Was admitted post hemodialysis after witnessed syncopal event with suspected seizure like activity.    Palliative care was asked to get involved to discuss goals of care in the setting of multiple co-morbid conditions.  Assessment: ESRD FTT CHF COPD Advanced dementia Goals of care conversation  Recommendations/Plan: Continue full code/full scope treatment Patient's sister remains hopeful for improvement of patient's nutrition and agitation Confirmed family meeting for tomorrow 10/30 at 2pm Ongoing support from PMT  Goals of Care and Additional Recommendations: Limitations on Scope of Treatment: No Artificial Feeding  Prognosis:  Very worrisome, certainly hospice appropriate if aligned with goc  Discharge Planning: To Be Determined  Care plan was discussed with Patient's sister  Total time: 15 minutes Greater than 50% of this time was spent in counseling and coordinating care related to the above assessment and plan.  Dorthy Cooler, PA-C Palliative Medicine Team Team phone # [phone]  Thank you for allowing the Palliative Medicine Team to assist in the care of this patient. Please utilize secure chat with additional questions, if there is no response within 30 minutes please call the above phone number.  Palliative Medicine Team providers are available by phone from 7am to 7pm daily and can be reached through the team cell  phone.  Should this patient require assistance outside of these hours, please call the patient's attending physician.

## 2020-11-18 NOTE — Progress Notes (Signed)
ANTICOAGULATION CONSULT NOTE - Follow-up Consult  Pharmacy Consult for apixaban>heparin Indication: pulmonary embolus  Allergies  Allergen Reactions   Morphine And Related Other (See Comments)    Family request not to be given, reports pt does not wake up when given    Promethazine Hcl Other (See Comments)    REACTION: lethargy    Patient Measurements: Height: 5\' 4"  (162.6 cm) Weight: 78 kg (171 lb 15.3 oz) IBW/kg (Calculated) : 54.7 Heparin Dosing Weight: 77.6 kg  Vital Signs: Temp: 97.9 F (36.6 C) (10/29 0027) Temp Source: Axillary (10/29 0027) BP: 138/70 (10/29 0027) Pulse Rate: 71 (10/29 0027)  Labs: Recent Labs    11/16/20 0046 11/17/20 0354 11/17/20 1031 11/18/20 0214  HGB 8.1* 8.2*  --  8.3*  HCT 24.8* 24.5*  --  25.4*  PLT 116* 159  --  PENDING  APTT 136* 70*  --  53*  HEPARINUNFRC  --  >1.10*  --  0.81*  CREATININE  --   --  2.67*  --      Estimated Creatinine Clearance: 18.4 mL/min (A) (by C-G formula based on SCr of 2.67 mg/dL (H)).   Assessment:  75 yo F with acute PE in L upper lobe, no RHS. Patient transitioned to apixaban 10/24 but transitioned back to heparin 10/26 with noncompliance and med refusal. Last dose of apixaban was 10/25 at 1435. Will need to monitor heparin using aptt checks until they correlate with daily heparin levels.   Difficult stick given restricted access d/t hemodialysis. Heparin level 0.81 (affected by apixaban), aPTT down to 53 sec. No issues with line or bleeding reported per RN.  Goal of Therapy:  Aptt goal 66-102s Heparin level 0.3-0.7 units/ml Monitor platelets by anticoagulation protocol: Yes   Plan:  Increase heparin to 600 units/hr F/u aPTT in 6 hours (will be a bit early but this is when pre-HD labs being drawn) F/u goals of care  Sherlon Handing, PharmD, BCPS Please see amion for complete clinical pharmacist phone list 11/18/2020 3:49 AM

## 2020-11-18 NOTE — Progress Notes (Signed)
ANTICOAGULATION CONSULT NOTE - Follow-up Consult  Pharmacy Consult for apixaban/heparin Indication: pulmonary embolus  Allergies  Allergen Reactions   Morphine And Related Other (See Comments)    Family request not to be given, reports pt does not wake up when given    Promethazine Hcl Other (See Comments)    REACTION: lethargy    Patient Measurements: Height: 5\' 4"  (162.6 cm) Weight: 78.5 kg (173 lb 1 oz) IBW/kg (Calculated) : 54.7 Heparin Dosing Weight: 77.6 kg  Vital Signs: Temp: 98.2 F (36.8 C) (10/29 1239) Temp Source: Oral (10/29 1239) BP: 144/69 (10/29 1239) Pulse Rate: 87 (10/29 1239)  Labs: Recent Labs    11/16/20 0046 11/17/20 0354 11/17/20 1031 11/18/20 0214  HGB 8.1* 8.2*  --  8.3*  HCT 24.8* 24.5*  --  25.4*  PLT 116* 159  --  162  APTT 136* 70*  --  53*  HEPARINUNFRC  --  >1.10*  --  0.81*  CREATININE  --   --  2.67*  --      Estimated Creatinine Clearance: 18.5 mL/min (A) (by C-G formula based on SCr of 2.67 mg/dL (H)).   Medical History: Past Medical History:  Diagnosis Date   Adenomatous colon polyp    Allergy    Anemia    Asthma        CAD (coronary artery disease)    Mild very minimal coronary disease with 20% obtuse marginal stenosis   Carpal tunnel syndrome on left    Cataract    CHF (congestive heart failure) (H[facility])    Chronic kidney disease (CKD), stage III (moderate) (H[facility])    now stage 4   COPD (chronic obstructive pulmonary disease) ([place])    Dementia with behavioral disturbance 03/09/2020   Depression    Diabetes mellitus 1997   Type II    Diverticulosis    Dyspnea    Elevated diaphragm November 2011   Right side   Esophageal dysmotility    Esophageal stricture    ESRD on hemodialysis ([place]) 03/09/2020   Gastritis    Gastroparesis 08/21/2007   GERD (gastroesophageal reflux disease)    Hearing loss of both ears    Hernia, hiatal    Hyperlipidemia    Hypertension    Morbid obesity ([place])    OSTEOARTHRITIS 08/09/2006    Osteoporosis    Oxygen deficiency    prn    PERIPHERAL NEUROPATHY, FEET 09/23/2007   RENAL INSUFFICIENCY 02/16/2009   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   Seizures ([place])    pt thinks it has been several monthes since she had a seisure   Shingles    Sickle cell trait ([place])    Sleep apnea    uses cpap   Stroke [facility])     Assessment:  75 yo F with acute PE in L upper lobe, no RHS. Pharmacy consulted for heparin dosing.   Patient transitioned to apixaban 10/24, now orders to transition back to heparin with noncompliance and med refusal. Heparin was restarted 10/26 14:38. Heparin level was planned to be taken with HD labs today; however, that did not occur. When a stat lab place was ordered, patient became combative and refused lab draw.   Discussed with MD the inability to use enoxaparin in HD patients. We discussed all possible options and agreed that even though patient has poor PO intake, continuing IV heparin without being able to check levels could be dangerous. Came to the solution that resuming apixaban would be the best option.  Went to  patients room to assess barriers to PO meds and discuss the importance of not refusing medications. However, patient would not acknowledge me nor answer my questions.   [place] meeting scheduled for 10/30 at 2PM to discuss comfort care and hospice further if she is unable to meet nutritional needs with PO intake.   Goal of Therapy:  Monitor platelets by anticoagulation protocol: Yes   Plan:  DC heparin Transition to apixaban 10mg  bid x 7 days then 5mg  bid F/up CBC, s/sx bleeding, [place], Pharm.D. PGY-1 Pharmacy Resident [phone] 11/18/2020 2:20 PM

## 2020-11-18 NOTE — Plan of Care (Signed)
  Problem: Clinical Measurements: Goal: Respiratory complications will improve Outcome: Progressing Goal: Cardiovascular complication will be avoided Outcome: Progressing   

## 2020-11-18 NOTE — Procedures (Signed)
I was present at this dialysis session. I have reviewed the session itself and made appropriate changes.   Vital signs in last 24 hours:  Temp:  [96.5 F (35.8 C)-98.4 F (36.9 C)] 98.1 F (36.7 C) (10/29 0753) Pulse Rate:  [71-82] 81 (10/29 0430) Resp:  [13-18] 14 (10/29 0800) BP: (131-158)/(63-70) 155/67 (10/29 0753) SpO2:  [98 %-100 %] 100 % (10/29 0739) Weight:  [79 kg] 79 kg (10/29 0739) Weight change:  Filed Weights   11/16/20 0725 11/16/20 1145 11/18/20 0739  Weight: 80.2 kg 78 kg 79 kg    Recent Labs  Lab 11/17/20 1031  NA 133*  K 3.9  CL 98  CO2 29  GLUCOSE 131*  BUN 8  CREATININE 2.67*  CALCIUM 8.2*  PHOS <1.0*    Recent Labs  Lab 11/16/20 0046 11/17/20 0354 11/18/20 0214  WBC 8.1 10.4 11.2*  HGB 8.1* 8.2* 8.3*  HCT 24.8* 24.5* 25.4*  MCV 76.8* 75.6* 77.0*  PLT 116* 159 162    Scheduled Meds:  sodium chloride   Intravenous Once   Chlorhexidine Gluconate Cloth  6 each Topical Q0600   darbepoetin (ARANESP) injection - DIALYSIS  200 mcg Intravenous Q Tue-HD   feeding supplement (NEPRO CARB STEADY)  237 mL Oral TID BM   mouth rinse  15 mL Mouth Rinse BID   multivitamin  1 tablet Oral QHS   sodium chloride flush  10-40 mL Intracatheter Q12H   thiamine injection  100 mg Intravenous Daily   Continuous Infusions:  sodium chloride     heparin 600 Units/hr (11/18/20 0358)   potassium PHOSPHATE IVPB (in mmol)     PRN Meds:.Dialysis Orders: Center: [facility]  on TTS . 180NRe, 4 hours, BFR 400, DFR 500, EDW 77kg 4K, 2.25Ca, TDC Heparin 6000 unit bolus Mircera 240mcg IV q 2 weeks- last dose 10/31/20 Hectorol 23mcg IV q HD   Assessment/Plan: Syncope/PE: Hypotensive on arrival despite no UF with HD, given 1.5L IVF total. CTA showed PE. LE US performed (+) DVT left common femoral vein. Started on heparin   ESRD:  TTS schedule. Next HD tomorrow.  Hypophosphatemia - ordered IV KPhos but pt refused IV placement.  Hypertension/volume: Hypotensive on arrival, given  1.5L UF and now appears close to euvolemic, no edema on CT. Blood pressure improved however weights are variable. UF as tolerated with HD  Anemia: S/p 1 unit PRBC 10/22 for Hgb 7-now 8.2 and stable. Monitor trend. Pt was on max ESA outpatient and last dose was on 10/11-now on aranesp here q Tuesday. Give blood transfusion as needed.  Metabolic bone disease: Calcium controlled, last phos was 1.6- ordered a dose of k phos neutral but patient refused. Rechecking RFP this AM.  Dispo: Patient does not seem to be making much progress and is refusing most meds including eliquis. Palliative care is planning a family meeting again but is still full code.         Joann Potts,  MD 11/18/2020, 8:47 AM

## 2020-11-19 DIAGNOSIS — I2699 Other pulmonary embolism without acute cor pulmonale: Secondary | ICD-10-CM | POA: Diagnosis not present

## 2020-11-19 DIAGNOSIS — R4182 Altered mental status, unspecified: Secondary | ICD-10-CM | POA: Diagnosis not present

## 2020-11-19 LAB — GLUCOSE, CAPILLARY
Glucose-Capillary: 110 mg/dL — ABNORMAL HIGH (ref 70–99)
Glucose-Capillary: 91 mg/dL (ref 70–99)
Glucose-Capillary: 106 mg/dL — ABNORMAL HIGH (ref 70–99)
Glucose-Capillary: 169 mg/dL — ABNORMAL HIGH (ref 70–99)

## 2020-11-19 NOTE — Progress Notes (Signed)
   Received voicemail message from patient's sister Joann Gutierrez requesting to reschedule today's planned family meeting. Returned her call and emphasized the importance of discussing next steps when family is available. I briefly shared my concern for patient's ongoing nutritional decline this weekend, despite eating some of her meals with encouragement.   [place] meeting tentatively rescheduled for tomorrow 10/31 at 2pm. I will not be on service but will ask one of my colleagues to meet with family if available. Otherwise, I will return on 11/1 and continue supporting family with decision-making. Bud Face is aware and has PMT contact information.   Dorthy Cooler, [facility] Palliative Medicine Team  Team Phone # [phone]   NO CHARGE

## 2020-11-19 NOTE — Progress Notes (Signed)
PROGRESS NOTE    Joann Gutierrez  WNU:[id number] DOB: September 26, 1945 DOA: 11/09/2020  PCP: Andree Moro, DO    Brief Narrative: 75 year old with past medical history significant for ESRD on hemodialysis TTS, anemia, asthma, CAD, chronic diastolic heart failure, pulmonary hypertension, COPD advanced dementia, chronic ambulatory dysfunction, depression, type 2 diabetes, seizure, chronic respiratory failure on home oxygen, OSA on CPAP, CVA presented to the ED via EMS from her hemodialysis center after she completed full treatment, no fluid was removed.  After patient completed treatment she had a witnessed syncopal episode with seizure-like activity.  Brief episode of apnea, chest compressions started after which patient woke up.  Hypoglycemic CBG 53.  She was also hypotensive 53/27 when EMS arrived.  She received IV bolus.  In the ED blood pressure was 82/54.  Patient received IV fluids.  However patient has had very poor oral intake and decreased mentation over the last several days.  Palliative care was consulted.    Assessment & Plan:   Acute pulmonary embolism and left upper lobe, No right side heart strain per CT -Patient initially presented with worsening hypoxemia and hypotension. -Troponin not significantly elevated at 25 and 27. -Patient was started on IV heparin.  Seen by critical care medicine.  Echocardiogram showed normal right ventricular function.  Transitioned to Eliquis on 10/24. Due to extremely poor oral intake patient had not been taking Eliquis consistently.  Patient was transitioned back to IV heparin on 10/26.  Refused heparin levels to be drawn so patient was transitioned over to Lovenox. Respiratory status remains stable.  SIRS: Sepsis ruled out.  Patient presented with hypotension, tachycardia, lactic acidosis with lactic acid at 8, leukocytosis. Related to hypotension, hypoperfusion, from PE. Completed Cefepime for 5 days.  Blood cultures: No growth to date.  CT  chest Negative for pneumonia, CT abdomen no evidence of infectious process. Pro-calcitonin elevated.  Discontinued vancomycin and flagyl 10/22.  Acute on chronic hypoxic respiratory failure: Initially required oxygen presumably due to PE.  Now on room air.    Diabetes mellitus type 2 with episodes of hypoglycemia  Not on insulin or hypoglycemic agent.  Low glucose levels were due to poor oral intake.  Did require D10 infusion briefly.  CBG stable for the most part.  Occasional low readings noted.  Mild troponin elevation Likely demand ischemia from PE.    Microcytic Anemia/thrombocytopenia Patient did require 1 unit of PRBC for hemoglobin of 7.0.  No evidence of overt bleeding noted.  Hemoglobin noted to be stable. Low platelet count likely due to consumption.  Has improved.  ESRD on hemodialysis TTS/hypophosphatemia Nephrology following.  Being dialyzed as per her usual schedule. Significantly low phosphorus level was noted.  Intravenous phosphorus was ordered by nephrology but patient refused to have another IV placed.  Chronic diastolic heart failure: Volume management with dialysis COPD: Continue with DuoNeb OSA: Continue with CPAP  Dementia: Delirium: Patient gets agitated at times and sometimes hits staff. Delirium precaution. CT head on admission negative. EEG negative for seizure.  Appears to have waxing and waning mentation.  Questionable seizure:  Likely related to hypoperfusion rather than real seizure.  EEG was negative.  Goals of care Patient has been gradually declining with poor oral intake.  Patient's mentation has been waxing and waning but patient's oral intake remains very poor.  She has been refusing to have IV placed as well so that we could give her supplemental phosphorus.   After initial meeting with family it was agreed that no feeding  tubes will be placed on this patient.  Another meeting to occur today to discuss [place].  Oral intake has been poor over  the last 48 hours.  Reasonable to transition her to hospice/comfort care.    DVT prophylaxis: Therapeutic Lovenox  Code Status: Full code Family Communication: No family at bedside. Disposition Plan: Unclear for now.  Status is: Inpatient  Remains inpatient appropriate because: Monitor oral intake, CBG for hypoglycemia. Might need SNF     Consultants:  Nephrology Palliative care  Procedures:  Echo Doppler lower extremity  Antimicrobials:    Subjective: Remains unresponsive  Objective: Vitals:   11/18/20 2100 11/19/20 0743 11/19/20 0800 11/19/20 0900  BP: 131/60 (!) 147/66 (!) 146/66 (!) 159/74  Pulse:  74 69 86  Resp: 17 15 11 19   Temp: 98.2 F (36.8 C) 97.7 F (36.5 C)    TempSrc: Oral Axillary    SpO2: 100% 100% 100% 97%  Weight:      Height:        Intake/Output Summary (Last 24 hours) at 11/19/2020 1109 Last data filed at 11/18/2020 2026 Gross per 24 hour  Intake 477 ml  Output --  Net 477 ml    Filed Weights   11/16/20 1145 11/18/20 0739 11/18/20 1210  Weight: 78 kg 79 kg 78.5 kg    Examination:  Remains unresponsive.  Opens eyes briefly but does not talk. Lungs are clear to auscultation bilaterally with diminished air entry at the bases S1-S2 is normal regular Abdomen is soft.  Nontender nondistended.   Data Reviewed: I have personally reviewed following labs and imaging studies  CBC: Recent Labs  Lab 11/13/20 1100 11/14/20 0751 11/16/20 0046 11/17/20 0354 11/18/20 0214  WBC 7.4 5.8 8.1 10.4 11.2*  HGB 8.4* 8.3* 8.1* 8.2* 8.3*  HCT 25.0* 25.4* 24.8* 24.5* 25.4*  MCV 73.7* 75.6* 76.8* 75.6* 77.0*  PLT 90* 98* 116* 159 671    Basic Metabolic Panel: Recent Labs  Lab 11/13/20 1043 11/14/20 0751 11/17/20 1031  NA 128*  128* 131* 133*  K 3.1*  3.0* 3.7 3.9  CL 96*  95* 99 98  CO2 24  23 25 29   GLUCOSE 123*  125* 113* 131*  BUN 9  9 8 8   CREATININE 3.79*  3.79* 2.78* 2.67*  CALCIUM 8.0*  7.9* 7.6* 8.2*  PHOS 2.2*   2.2* 1.6* <1.0*    GFR: Estimated Creatinine Clearance: 18.5 mL/min (A) (by C-G formula based on SCr of 2.67 mg/dL (H)).  Liver Function Tests: Recent Labs  Lab 11/13/20 1043 11/14/20 0751 11/17/20 1031  ALBUMIN 1.9* 1.9* 1.9*     CBG: Recent Labs  Lab 11/18/20 0035 11/18/20 0542 11/18/20 1232 11/18/20 1611 11/19/20 0742  GLUCAP 117* 110* 76 81 110*      Recent Results (from the past 240 hour(s))  Blood culture (routine x 2)     Status: None   Collection Time: 11/09/20  5:34 PM   Specimen: BLOOD  Result Value Ref Range Status   Specimen Description BLOOD LEFT ANTECUBITAL  Final   Special Requests   Final    BOTTLES DRAWN AEROBIC AND ANAEROBIC Blood Culture adequate volume   Culture   Final    NO GROWTH 5 DAYS Performed at [facility], [place] [address] [place], [place] [address]    Report Status 11/14/2020 FINAL  Final  Resp Panel by RT-PCR (Flu A&B, Covid) Nasopharyngeal Swab     Status: None   Collection Time: 11/09/20  7:24  PM   Specimen: Nasopharyngeal Swab; Nasopharyngeal(NP) swabs in vial transport medium  Result Value Ref Range Status   SARS Coronavirus 2 by RT PCR NEGATIVE NEGATIVE Final    Comment: (NOTE) SARS-CoV-2 target nucleic acids are NOT DETECTED.  The SARS-CoV-2 RNA is generally detectable in upper respiratory specimens during the acute phase of infection. The lowest concentration of SARS-CoV-2 viral copies this assay can detect is 138 copies/mL. A negative result does not preclude SARS-Cov-2 infection and should not be used as the sole basis for treatment or other patient management decisions. A negative result may occur with  improper specimen collection/handling, submission of specimen other than nasopharyngeal swab, presence of viral mutation(s) within the areas targeted by this assay, and inadequate number of viral copies(<138 copies/mL). A negative result must be combined with clinical observations, patient history, and  epidemiological information. The expected result is Negative.  Fact Sheet for Patients:  EntrepreneurPulse.com.au  Fact Sheet for Healthcare Providers:  IncredibleEmployment.be  This test is no t yet approved or cleared by the [place] FDA and  has been authorized for detection and/or diagnosis of SARS-CoV-2 by FDA under an Emergency Use Authorization (EUA). This EUA will remain  in effect (meaning this test can be used) for the duration of the COVID-19 declaration under Section 564(b)(1) of the Act, 21 U.S.C.section 360bbb-3(b)(1), unless the authorization is terminated  or revoked sooner.       Influenza A by PCR NEGATIVE NEGATIVE Final   Influenza B by PCR NEGATIVE NEGATIVE Final    Comment: (NOTE) The Xpert Xpress SARS-CoV-2/FLU/RSV plus assay is intended as an aid in the diagnosis of influenza from Nasopharyngeal swab specimens and should not be used as a sole basis for treatment. Nasal washings and aspirates are unacceptable for Xpert Xpress SARS-CoV-2/FLU/RSV testing.  Fact Sheet for Patients: EntrepreneurPulse.com.au  Fact Sheet for Healthcare Providers: IncredibleEmployment.be  This test is not yet approved or cleared by the [place] FDA and has been authorized for detection and/or diagnosis of SARS-CoV-2 by FDA under an Emergency Use Authorization (EUA). This EUA will remain in effect (meaning this test can be used) for the duration of the COVID-19 declaration under Section 564(b)(1) of the Act, 21 U.S.C. section 360bbb-3(b)(1), unless the authorization is terminated or revoked.  Performed at [facility], [place] [address] [place], [place] [address]           Radiology Studies: No results found.    Scheduled Meds:  sodium chloride   Intravenous Once   Chlorhexidine Gluconate Cloth  6 each Topical Q0600   darbepoetin (ARANESP) injection - DIALYSIS  200 mcg  Intravenous Q Tue-HD   enoxaparin (LOVENOX) injection  60 mg Subcutaneous Q24H   feeding supplement (NEPRO CARB STEADY)  237 mL Oral TID BM   mouth rinse  15 mL Mouth Rinse BID   multivitamin  1 tablet Oral QHS   sodium chloride flush  10-40 mL Intracatheter Q12H   thiamine injection  100 mg Intravenous Daily   Continuous Infusions:  sodium chloride     potassium PHOSPHATE IVPB (in mmol)       LOS: 10 days     Bonnielee Haff, MD [facility]   If 7PM-7AM, please contact night-coverage [email or web address]  11/19/2020, 11:09 AM

## 2020-11-19 NOTE — Progress Notes (Addendum)
Subjective: Woken from sleep, opens eyes to voice response, HD yesterday on schedule, no UF.  Noted palliative care meeting today with family 2:00 PM  Objective Vital signs in last 24 hours: Vitals:   11/18/20 1400 11/18/20 1729 11/18/20 2100 11/19/20 0743  BP: (!) 143/64 138/69 131/60 (!) 147/66  Pulse:  (!) 108  74  Resp: 15 14 17 15   Temp:   98.2 F (36.8 C) 97.7 F (36.5 C)  TempSrc:   Oral Axillary  SpO2:  100% 100% 100%  Weight:      Height:       Weight change:   Physical Exam: General: Chronically ill-appearing elderly female, NAD opens eyes to voice, no voiced response to  question Heart: RRR no MRG Lungs: CTA nonlabored breathing Abdomen: NABS, NTND Extremities: Trace bilateral lower extremity edema Dialysis Access: Right IJ TDC dressing dry clear  .Dialysis Orders: Center: [facility]  on TTS . 180NRe, 4 hours, BFR 400, DFR 500, EDW 77kg 4K, 2.25Ca, TDC Heparin 6000 unit bolus Mircera 215mcg IV q 2 weeks- last dose 10/31/20 Hectorol 62mcg IV q HD   Problem/Plan: Syncope/PE: Admit hypotensive/now stable BP, on admit given 1.5L IVF total. CTA showed PE. LE US performed (+) DVT left common femoral vein. Started on heparin   ESRD:  TTS schedule. Next HD Tuesday 11/01 if family continues dialysis after palliative care meeting today Hypophosphatemia - ordered IV KPhos but pt refused IV placement.  Hypertension/volume: Hypotensive on arrival, given 1.5L UF and now appears close to euvolemic, no edema on CT. Blood pressure improved however weights are variable.  Not standing for weights  UF as tolerated with HD  Anemia: Hgb 8.3 on 10/29, s/p 1 unit PRBC 10/22 for Hgb 7-, now stable. Monitor trend. Pt was on max ESA outpatient and last dose was on 10/11-now on aranesp here q Tuesday.  .  Metabolic bone disease: Calcium controlled, last phos was less than 1- ordered a dose of k phos neutral but patient refused.  Nutrition= albumin 1.9 little p.o. intake noted family did refuse  feeding tube with recent palliative care meeting Dispo: Plan of care meeting at 2 PM today with family.  Patient does not seem to be making much progress and is refusing most meds including eliquis.  Patient is still full code.   Ernest Haber, PA-C [facility] [phone] 11/19/2020,9:27 AM  LOS: 10 days   Labs: Basic Metabolic Panel: Recent Labs  Lab 11/13/20 1043 11/14/20 0751 11/17/20 1031  NA 128*  128* 131* 133*  K 3.1*  3.0* 3.7 3.9  CL 96*  95* 99 98  CO2 24  23 25 29   GLUCOSE 123*  125* 113* 131*  BUN 9  9 8 8   CREATININE 3.79*  3.79* 2.78* 2.67*  CALCIUM 8.0*  7.9* 7.6* 8.2*  PHOS 2.2*  2.2* 1.6* <1.0*   Liver Function Tests: Recent Labs  Lab 11/13/20 1043 11/14/20 0751 11/17/20 1031  ALBUMIN 1.9* 1.9* 1.9*   No results for input(s): LIPASE, AMYLASE in the last 168 hours. No results for input(s): AMMONIA in the last 168 hours. CBC: Recent Labs  Lab 11/13/20 1100 11/14/20 0751 11/16/20 0046 11/17/20 0354 11/18/20 0214  WBC 7.4 5.8 8.1 10.4 11.2*  HGB 8.4* 8.3* 8.1* 8.2* 8.3*  HCT 25.0* 25.4* 24.8* 24.5* 25.4*  MCV 73.7* 75.6* 76.8* 75.6* 77.0*  PLT 90* 98* 116* 159 162   Cardiac Enzymes: No results for input(s): CKTOTAL, CKMB, CKMBINDEX, TROPONINI in the last 168 hours.  CBG: Recent Labs  Lab 11/18/20 0035 11/18/20 0542 11/18/20 1232 11/18/20 1611 11/19/20 0742  GLUCAP 117* 110* 76 81 110*    Studies/Results: No results found. Medications:  sodium chloride     potassium PHOSPHATE IVPB (in mmol)      sodium chloride   Intravenous Once   Chlorhexidine Gluconate Cloth  6 each Topical Q0600   darbepoetin (ARANESP) injection - DIALYSIS  200 mcg Intravenous Q Tue-HD   enoxaparin (LOVENOX) injection  60 mg Subcutaneous Q24H   feeding supplement (NEPRO CARB STEADY)  237 mL Oral TID BM   mouth rinse  15 mL Mouth Rinse BID   multivitamin  1 tablet Oral QHS   sodium chloride flush  10-40 mL Intracatheter Q12H    thiamine injection  100 mg Intravenous Daily   I have seen and examined this patient and agree with plan and assessment in the above note with renal recommendations/intervention highlighted. Awaiting goals of care meeting today. [place] John A Mearl Hare[place],MD 11/19/2020 10:19 AM

## 2020-11-19 NOTE — Plan of Care (Signed)
  Problem: Pain Managment: Goal: General experience of comfort will improve Outcome: Progressing   Problem: Safety: Goal: Ability to remain free from injury will improve Outcome: Progressing   

## 2020-11-20 DIAGNOSIS — R4182 Altered mental status, unspecified: Secondary | ICD-10-CM | POA: Diagnosis not present

## 2020-11-20 DIAGNOSIS — I2699 Other pulmonary embolism without acute cor pulmonale: Secondary | ICD-10-CM | POA: Diagnosis not present

## 2020-11-20 LAB — CBC
HCT: 24.9 % — ABNORMAL LOW (ref 36.0–46.0)
Hemoglobin: 8.3 g/dL — ABNORMAL LOW (ref 12.0–15.0)
MCH: 25.5 pg — ABNORMAL LOW (ref 26.0–34.0)
MCHC: 33.3 g/dL (ref 30.0–36.0)
MCV: 76.6 fL — ABNORMAL LOW (ref 80.0–100.0)
Platelets: 184 10*3/uL (ref 150–400)
RBC: 3.25 MIL/uL — ABNORMAL LOW (ref 3.87–5.11)
RDW: 26.4 % — ABNORMAL HIGH (ref 11.5–15.5)
WBC: 12.5 10*3/uL — ABNORMAL HIGH (ref 4.0–10.5)
nRBC: 2.9 % — ABNORMAL HIGH (ref 0.0–0.2)

## 2020-11-20 LAB — GLUCOSE, CAPILLARY
Glucose-Capillary: 156 mg/dL — ABNORMAL HIGH (ref 70–99)
Glucose-Capillary: 177 mg/dL — ABNORMAL HIGH (ref 70–99)
Glucose-Capillary: 197 mg/dL — ABNORMAL HIGH (ref 70–99)
Glucose-Capillary: 248 mg/dL — ABNORMAL HIGH (ref 70–99)
Glucose-Capillary: 269 mg/dL — ABNORMAL HIGH (ref 70–99)

## 2020-11-20 MED ORDER — HALOPERIDOL 0.5 MG PO TABS
0.5000 mg | ORAL_TABLET | Freq: Four times a day (QID) | ORAL | Status: DC | PRN
  Filled 2020-11-20: qty 1

## 2020-11-20 MED ORDER — HALOPERIDOL LACTATE 5 MG/ML IJ SOLN: 1.0000 mg | Freq: Four times a day (QID) | INTRAMUSCULAR | Status: DC | PRN

## 2020-11-20 MED ORDER — FLUTICASONE PROPIONATE 50 MCG/ACT NA SUSP
1.0000 | Freq: Every day | NASAL | Status: DC
  Administered 2020-11-20 – 2020-11-23 (×2): 1 via NASAL
  Filled 2020-11-20: qty 16
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
## 2020-11-20 NOTE — Care Management Important Message (Signed)
Important Message  Patient Details  Name: Joann Gutierrez MRN: [id number] Date of Birth: 12/20/1945   Medicare Important Message Given:  Yes     [name] 11/20/2020, 11:55 AM
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
## 2020-11-21 DIAGNOSIS — R4182 Altered mental status, unspecified: Secondary | ICD-10-CM | POA: Diagnosis not present

## 2020-11-21 DIAGNOSIS — E872 Acidosis, unspecified: Secondary | ICD-10-CM | POA: Diagnosis not present

## 2020-11-21 DIAGNOSIS — I2699 Other pulmonary embolism without acute cor pulmonale: Secondary | ICD-10-CM | POA: Diagnosis not present

## 2020-11-21 DIAGNOSIS — R6251 Failure to thrive (child): Secondary | ICD-10-CM

## 2020-11-21 DIAGNOSIS — I509 Heart failure, unspecified: Secondary | ICD-10-CM

## 2020-11-21 DIAGNOSIS — R627 Adult failure to thrive: Secondary | ICD-10-CM

## 2020-11-21 DIAGNOSIS — J449 Chronic obstructive pulmonary disease, unspecified: Secondary | ICD-10-CM

## 2020-11-21 DIAGNOSIS — A419 Sepsis, unspecified organism: Secondary | ICD-10-CM | POA: Diagnosis not present

## 2020-11-21 DIAGNOSIS — Z7189 Other specified counseling: Secondary | ICD-10-CM | POA: Diagnosis not present

## 2020-11-21 DIAGNOSIS — F0390 Unspecified dementia without behavioral disturbance: Secondary | ICD-10-CM

## 2020-11-21 LAB — CBC
HCT: 24.7 % — ABNORMAL LOW (ref 36.0–46.0)
Hemoglobin: 8 g/dL — ABNORMAL LOW (ref 12.0–15.0)
MCH: 25.7 pg — ABNORMAL LOW (ref 26.0–34.0)
MCHC: 32.4 g/dL (ref 30.0–36.0)
MCV: 79.4 fL — ABNORMAL LOW (ref 80.0–100.0)
Platelets: 176 10*3/uL (ref 150–400)
RBC: 3.11 MIL/uL — ABNORMAL LOW (ref 3.87–5.11)
RDW: 27.6 % — ABNORMAL HIGH (ref 11.5–15.5)
WBC: 12.6 10*3/uL — ABNORMAL HIGH (ref 4.0–10.5)
nRBC: 1 % — ABNORMAL HIGH (ref 0.0–0.2)

## 2020-11-21 LAB — RENAL FUNCTION PANEL
Albumin: 1.8 g/dL — ABNORMAL LOW (ref 3.5–5.0)
Anion gap: 7 (ref 5–15)
BUN: 17 mg/dL (ref 8–23)
CO2: 29 mmol/L (ref 22–32)
Calcium: 7.8 mg/dL — ABNORMAL LOW (ref 8.9–10.3)
Chloride: 98 mmol/L (ref 98–111)
Creatinine, Ser: 3.39 mg/dL — ABNORMAL HIGH (ref 0.44–1.00)
GFR, Estimated: 14 mL/min — ABNORMAL LOW (ref >60)
Glucose, Bld: 205 mg/dL — ABNORMAL HIGH (ref 70–99)
Phosphorus: <1 mg/dL — CL (ref 2.5–4.6)
Potassium: 3.5 mmol/L (ref 3.5–5.1)
Sodium: 134 mmol/L — ABNORMAL LOW (ref 135–145)

## 2020-11-21 LAB — GLUCOSE, CAPILLARY
Glucose-Capillary: 135 mg/dL — ABNORMAL HIGH (ref 70–99)
Glucose-Capillary: 219 mg/dL — ABNORMAL HIGH (ref 70–99)
Glucose-Capillary: 265 mg/dL — ABNORMAL HIGH (ref 70–99)

## 2020-11-21 MED ORDER — HEPARIN SODIUM (PORCINE) 1000 UNIT/ML DIALYSIS
1000.0000 [IU] | INTRAMUSCULAR | Status: DC | PRN
  Administered 2020-11-21: 3800 [IU] via INTRAVENOUS_CENTRAL
  Filled 2020-11-21: qty 1

## 2020-11-21 MED ORDER — SODIUM CHLORIDE 0.9 % IV SOLN: 100.0000 mL | INTRAVENOUS | Status: DC | PRN

## 2020-11-21 MED ORDER — PENTAFLUOROPROP-TETRAFLUOROETH EX AERO: 1.0000 "application " | INHALATION_SPRAY | CUTANEOUS | Status: DC | PRN

## 2020-11-21 MED ORDER — ALTEPLASE 2 MG IJ SOLR: 2.0000 mg | Freq: Once | INTRAMUSCULAR | Status: DC | PRN

## 2020-11-21 MED ORDER — LIDOCAINE HCL (PF) 1 % IJ SOLN: 5.0000 mL | INTRAMUSCULAR | Status: DC | PRN

## 2020-11-21 MED ORDER — HEPARIN SODIUM (PORCINE) 1000 UNIT/ML DIALYSIS
2000.0000 [IU] | Freq: Once | INTRAMUSCULAR | Status: AC
  Administered 2020-11-21: 2000 [IU] via INTRAVENOUS_CENTRAL
  Filled 2020-11-21: qty 2

## 2020-11-21 MED ORDER — LIDOCAINE-PRILOCAINE 2.5-2.5 % EX CREA: 1.0000 "application " | TOPICAL_CREAM | CUTANEOUS | Status: DC | PRN
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
## 2020-11-21 NOTE — Progress Notes (Signed)
ANTICOAGULATION CONSULT NOTE - Follow-up Consult  Pharmacy Consult for apixaban/heparin Indication: pulmonary embolus  Allergies  Allergen Reactions   Morphine And Related Other (See Comments)    Family request not to be given, reports pt does not wake up when given    Promethazine Hcl Other (See Comments)    REACTION: lethargy    Patient Measurements: Height: 5\' 4"  (162.6 cm) Weight: 77.2 kg (170 lb 3.1 oz) IBW/kg (Calculated) : 54.7 Heparin Dosing Weight: 77.6 kg  Vital Signs: Temp: 97.1 F (36.2 C) (11/01 1105) Temp Source: Axillary (11/01 1105) BP: 124/67 (11/01 1105) Pulse Rate: 104 (11/01 1105)  Labs: Recent Labs    11/20/20 0216 11/21/20 0825  HGB 8.3* 8.0*  HCT 24.9* 24.7*  PLT 184 176  CREATININE  --  3.39*     Estimated Creatinine Clearance: 14.4 mL/min (A) (by C-G formula based on SCr of 3.39 mg/dL (H)).   Medical History: Past Medical History:  Diagnosis Date   Adenomatous colon polyp    Allergy    Anemia    Asthma        CAD (coronary artery disease)    Mild very minimal coronary disease with 20% obtuse marginal stenosis   Carpal tunnel syndrome on left    Cataract    CHF (congestive heart failure) (HCC)    Chronic kidney disease (CKD), stage III (moderate) (HCC)    now stage 4   COPD (chronic obstructive pulmonary disease) ([place])    Dementia with behavioral disturbance 03/09/2020   Depression    Diabetes mellitus 1997   Type II    Diverticulosis    Dyspnea    Elevated diaphragm November 2011   Right side   Esophageal dysmotility    Esophageal stricture    ESRD on hemodialysis ([place]) 03/09/2020   Gastritis    Gastroparesis 08/21/2007   GERD (gastroesophageal reflux disease)    Hearing loss of both ears    Hernia, hiatal    Hyperlipidemia    Hypertension    Morbid obesity ([place])    OSTEOARTHRITIS 08/09/2006   Osteoporosis    Oxygen deficiency    prn    PERIPHERAL NEUROPATHY, FEET 09/23/2007   RENAL INSUFFICIENCY 02/16/2009    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   Seizures ([place])    pt thinks it has been several monthes since she had a seisure   Shingles    Sickle cell trait ([place])    Sleep apnea    uses cpap   Stroke [facility])     Assessment:  75 yo F with acute PE in L upper lobe, no RHS. Pharmacy consulted for heparin dosing then transitioned to apixaban and back to heparin. Pt refused heparin labs so transitioned to enoxaparin on 10/29 after discussion with MD and review of literature (Pon TK et al. Jacolyn Reedy Res. 2014 Jun;133(6):1023-8).   Pt is intermittently refusing medications/labs, therefore will forego checking enoxaparin level at this time.  Goal of Therapy:  Enoxaparin 4 hour anti-Xa level 0.5 -1 units/mL Monitor platelets by anticoagulation protocol: Yes   Plan:  Continue enoxaparin 60mg  Q24 hr  Order 4-hour enoxaparin anti-Xa level if continuing for more than 3 doses  F/up CBC, s/sx bleeding, GOC meeting    Arrie Senate, PharmD, BCPS, [facility] Clinical Pharmacist [phone] Please check AMION for all [place] numbers 11/21/2020

## 2020-11-21 NOTE — Progress Notes (Signed)
Critical phosphorous noted-<1.0. Juanell Fairly, NP on unit and made aware. No new orders noted.

## 2020-11-21 NOTE — Progress Notes (Signed)
PROGRESS NOTE    Joann Gutierrez  NOM:[id number] DOB: March 13, 1945 DOA: 11/09/2020  PCP: Andree Moro, DO    Brief Narrative: 75 year old with past medical history significant for ESRD on hemodialysis TTS, anemia, asthma, CAD, chronic diastolic heart failure, pulmonary hypertension, COPD advanced dementia, chronic ambulatory dysfunction, depression, type 2 diabetes, seizure, chronic respiratory failure on home oxygen, OSA on CPAP, CVA presented to the ED via EMS from her hemodialysis center after she completed full treatment, no fluid was removed.  After patient completed treatment she had a witnessed syncopal episode with seizure-like activity.  Brief episode of apnea, chest compressions started after which patient woke up.  Hypoglycemic CBG 53.  She was also hypotensive 53/27 when EMS arrived.  She received IV bolus.  In the ED blood pressure was 82/54.  Patient received IV fluids.  However patient has had very poor oral intake and decreased mentation over the last several days.  Palliative care was consulted.    Assessment & Plan:   Acute pulmonary embolism and left upper lobe, No right side heart strain per CT Patient initially presented with worsening hypoxemia and hypotension.  Found to have PE on CT scan. Patient was started on IV heparin.  Seen by critical care medicine.  Echocardiogram showed normal right ventricular function.  Transitioned to Eliquis on 10/24. Due to extremely poor oral intake patient had not been taking Eliquis consistently.  Patient was transitioned back to IV heparin on 10/26.   Refused heparin levels to be drawn so patient was transitioned over to Lovenox. Respiratory status remains stable.  SIRS: Sepsis ruled out.  Patient presented with hypotension, tachycardia, lactic acidosis with lactic acid at 8, leukocytosis. Related to hypotension, hypoperfusion, from PE. Completed Cefepime for 5 days.  Blood cultures: No growth to date.  CT chest Negative for  pneumonia, CT abdomen no evidence of infectious process. Pro-calcitonin elevated.  Discontinued vancomycin and flagyl 10/22.  Acute on chronic hypoxic respiratory failure: Initially required oxygen presumably due to PE.  Now on room air.    Diabetes mellitus type 2 with episodes of hypoglycemia  Not on insulin or hypoglycemic agent.  Low glucose levels were due to poor oral intake.  Did require D10 infusion briefly.  CBGs stable for the most part.  Oral intake remains poor.  Mild troponin elevation Likely demand ischemia from PE.    Microcytic Anemia/thrombocytopenia Patient did require 1 unit of PRBC for hemoglobin of 7.0.  No evidence of overt bleeding noted.  Hemoglobin noted to be stable. Low platelet count likely due to consumption.  Has improved.  ESRD on hemodialysis TTS/hypophosphatemia Nephrology following.  Being dialyzed as per her usual schedule. Significantly low phosphorus level was noted.  Intravenous phosphorus was ordered by nephrology but patient refused to have another IV placed. Nephrology has deemed that patient may not be a candidate to continue with hemodialysis considering her decline and also including the fact that she has been lashing out at the staff.  Chronic diastolic heart failure: Volume management with dialysis COPD: Continue with DuoNeb OSA: Continue with CPAP  Dementia: Delirium: Patient gets agitated at times and sometimes hits staff. Delirium precaution. CT head on admission negative. Appears to have waxing and waning mentation. EEG negative for seizure.    Questionable seizure:  Likely related to hypoperfusion rather than real seizure.  EEG was negative.  Goals of care Patient has been gradually declining with poor oral intake.  Patient's mentation has been waxing and waning but patient's oral intake remains very  poor.  She has been refusing to have IV placed to replace her low phosphorus.   After initial meeting with family it was agreed that  no feeding tubes will be placed on this patient.   Another meeting was supposed to take place on 10/30 but family was not able to come to the meeting.  Palliative medicine is following and plans to meet with family again in the near future.   Patient has not really made any progress in the last several days and has been slowly declining.  Aggressive care including dialysis is futile.    DVT prophylaxis: Therapeutic Lovenox  Code Status: Full code Family Communication: No family at bedside. Disposition Plan: Unclear for now.  Status is: Inpatient  Remains inpatient appropriate because: Monitor oral intake, CBG for hypoglycemia. Might need SNF     Consultants:  Nephrology Palliative care  Procedures:  Echo Doppler lower extremity  Antimicrobials:    Subjective: Opens her eyes but does not really communicate.  Seen while at hemodialysis.  Objective: Vitals:   11/21/20 0800 11/21/20 0830 11/21/20 0900 11/21/20 0930  BP: (!) 127/57 (!) 103/45 (!) 103/53 109/70  Pulse: 89 100 (!) 106 (!) 107  Resp:      Temp:      TempSrc:      SpO2:      Weight:      Height:       No intake or output data in the 24 hours ending 11/21/20 1008  Filed Weights   11/18/20 1210 11/21/20 0404 11/21/20 0735  Weight: 78.5 kg 74.5 kg 79 kg    Examination:  Opens her eyes but does not really communicate S1-S2 normal regular Lung clear to auscultation Abdomen is soft   Data Reviewed: I have personally reviewed following labs and imaging studies  CBC: Recent Labs  Lab 11/16/20 0046 11/17/20 0354 11/18/20 0214 11/20/20 0216 11/21/20 0825  WBC 8.1 10.4 11.2* 12.5* 12.6*  HGB 8.1* 8.2* 8.3* 8.3* 8.0*  HCT 24.8* 24.5* 25.4* 24.9* 24.7*  MCV 76.8* 75.6* 77.0* 76.6* 79.4*  PLT 116* 159 162 184 627    Basic Metabolic Panel: Recent Labs  Lab 11/17/20 1031 11/21/20 0825  NA 133* 134*  K 3.9 3.5  CL 98 98  CO2 29 29  GLUCOSE 131* 205*  BUN 8 17  CREATININE 2.67* 3.39*   CALCIUM 8.2* 7.8*  PHOS <1.0* <1.0*    GFR: Estimated Creatinine Clearance: 14.6 mL/min (A) (by C-G formula based on SCr of 3.39 mg/dL (H)).  Liver Function Tests: Recent Labs  Lab 11/17/20 1031 11/21/20 0825  ALBUMIN 1.9* 1.8*     CBG: Recent Labs  Lab 11/20/20 0014 11/20/20 0405 11/20/20 1204 11/20/20 1832 11/20/20 2141  GLUCAP 269* 197* 156* 248* 177*      Radiology Studies: No results found.    Scheduled Meds:  sodium chloride   Intravenous Once   Chlorhexidine Gluconate Cloth  6 each Topical Q0600   darbepoetin (ARANESP) injection - DIALYSIS  200 mcg Intravenous Q Tue-HD   enoxaparin (LOVENOX) injection  60 mg Subcutaneous Q24H   feeding supplement (NEPRO CARB STEADY)  237 mL Oral TID BM   fluticasone  1 spray Each Nare Daily   mouth rinse  15 mL Mouth Rinse BID   multivitamin  1 tablet Oral QHS   sodium chloride flush  10-40 mL Intracatheter Q12H   thiamine injection  100 mg Intravenous Daily   Continuous Infusions:  sodium chloride     sodium  chloride     sodium chloride     potassium PHOSPHATE IVPB (in mmol)       LOS: 12 days     Bonnielee Haff, MD [facility]   If 7PM-7AM, please contact night-coverage [email or web address]  11/21/2020, 10:08 AM

## 2020-11-21 NOTE — Progress Notes (Signed)
PT Cancellation Note  Patient Details Name: MAEVIS MUMBY MRN: [id number] DOB: 1945/06/10   Cancelled Treatment:    Reason Eval/Treat Not Completed: Patient at procedure or test/unavailable.  In HD and will retry as time and pt allow.   Ramond Dial 11/21/2020, 10:23 AM  Mee Hives, PT PhD Acute Rehab Dept. Number: [place] [facility] [place]

## 2020-11-21 NOTE — Progress Notes (Addendum)
HCPOA paperwork received from patients daughter [name] . CM spoke with patients daughter with [employer], and CSW by phone. CM requested for HCPOA paperwork to be resent. CM and CSW unable to see seal on paperwork. [name] reports upcoming court date for guardianship of patient.Patients daughter gave CSW and CM permission to call Guardian ad Litem. CSW called Cassell Smiles with [employer] who reports that [name] is patients niece raised like a daughter.Cassell Smiles with [employer] reports Guardianship has not yet been appointed for patient. Cassell Smiles reports Guardianship set to be determined in November. TOC awaiting HCPOA paperwork that [name] will be sending over for review. TOC will continue to follow.

## 2020-11-21 NOTE — Progress Notes (Addendum)
Daily Progress Note   Patient Name: Joann Gutierrez       Date: 11/21/2020 DOB: [birth year]  Age: 75 y.o. MRN#: [id number] Attending Physician: Bonnielee Haff, MD Primary Care Physician: Andree Moro, DO Admit Date: 11/09/2020  Reason for Consultation/Follow-up: Establishing goals of care  Subjective: Medical records reviewed. Patient does not answer questions or respond. Have not yet received return call from patient's sister after leaving a voicemail this morning.  Updates received from [facility] unit director regarding potential HCPOA documentation indicating patient's niece Joann Gutierrez has been named by patient in the past. Reviewed paperwork and currently there is a seal missing from the notary.  Called patient's niece Joann Gutierrez to provide updates, support, and education regarding legal medical decision-making documentation in [place]. Reviewed patient's current medical condition and poor prognosis in detail.  Emotional support and therapeutic listening was provided as Joann Gutierrez shared that she has been worried about patient's quality of life for quite some time. She is a [employer] scientist and feels the patient is appropriate for comfort focused care and DNR given her rapid cognitive, functional, and nutritional decline in addition to her frailty and chronic conditions.  I shared that I am also worried about the patient and agree she is appropriate for DNR and comfort-focused care, given lack of improvement with medical interventions. Furthermore, attempts to provide mediations and obtain labs continue to agitate her and she has refused all of this repeatedly, while showing nonverbal signs of suffering.   We then reviewed recent family meeting with patient's son and sister resulting in the decision against feeding tube  placement - Joann Gutierrez is in agreement.  She shares that family has a history of intense conflict and difficult dynamics. She feels patient may continue to suffer and she expects a delay in further decision-making, as this family conflict has also complicated care in the past. She is willing to produce HCPOA documentation, but is hesitant to put herself at risk of repercussions from family that disagrees. We discussed the importance of ongoing conversations and prioritizing patient first over individual family members' beliefs and preferences.   Addendum: I then called patient's son Joann Gutierrez to emphasize the importance of meeting again to discuss goals of care further. He informs me that he and patient's sister Joann Gutierrez are interested in meeting again this upcoming weekend. I shared that patient's condition  is of great concern and strongly recommended he and his family meet with me as soon as possible to discuss code status and comfort care in more detail. He states that he will speak with Joann Gutierrez and let me know their availability.   Questions and concerns addressed. PMT will continue to support holistically.  Length of Stay: 12  Current Medications: Scheduled Meds:   sodium chloride   Intravenous Once   Chlorhexidine Gluconate Cloth  6 each Topical Q0600   darbepoetin (ARANESP) injection - DIALYSIS  200 mcg Intravenous Q Tue-HD   enoxaparin (LOVENOX) injection  60 mg Subcutaneous Q24H   feeding supplement (NEPRO CARB STEADY)  237 mL Oral TID BM   fluticasone  1 spray Each Nare Daily   mouth rinse  15 mL Mouth Rinse BID   multivitamin  1 tablet Oral QHS   sodium chloride flush  10-40 mL Intracatheter Q12H   thiamine injection  100 mg Intravenous Daily    Continuous Infusions:  sodium chloride     potassium PHOSPHATE IVPB (in mmol)      PRN Meds: sodium chloride, acetaminophen **OR** acetaminophen, dextrose, haloperidol **OR** haloperidol lactate, ipratropium-albuterol, sodium chloride  flush  Physical Exam Vitals and nursing note reviewed.  Constitutional:      General: She is not in acute distress.    Appearance: She is ill-appearing.  Cardiovascular:     Rate and Rhythm: Normal rate.  Pulmonary:     Effort: Pulmonary effort is normal.  Skin:    General: Skin is warm and dry.  Neurological:     Mental Status: She is lethargic.            Vital Signs: BP (!) 147/79 (BP Location: Left Leg)   Pulse 83   Temp 100 F (37.8 C) (Oral)   Resp 16   Ht 5\' 4"  (1.626 m)   Wt 77.2 kg   SpO2 100%   BMI 29.21 kg/m  SpO2: SpO2: 100 % O2 Device: O2 Device: Room Air O2 Flow Rate: O2 Flow Rate (L/min): 4 L/min  Intake/output summary:  Intake/Output Summary (Last 24 hours) at 11/21/2020 1711 Last data filed at 11/21/2020 1030 Gross per 24 hour  Intake --  Output 958 ml  Net -958 ml    LBM: Last BM Date: 11/20/20 Baseline Weight: Weight: 99 kg Most recent weight: Weight: 77.2 kg      Palliative Assessment/Data: 10%      Patient Active Problem List   Diagnosis Date Noted   Lactic acidosis    Pressure injury of skin 11/12/2020   Syncope 11/10/2020   Hypotension 11/10/2020   Hypoglycemia 11/10/2020   Severe sepsis ([place]) 11/09/2020   Delirium 04/10/2020   Adult failure to thrive 04/10/2020   Acute bilat watershed infarction [facility]) 03/31/2020   AMS (altered mental status) 32/44/0102   Acute metabolic encephalopathy 72/53/6644   ESRD on hemodialysis ([place]) 03/09/2020   Thrombocytopenia ([place]) 03/09/2020   Dementia with behavioral disturbance 03/09/2020   COVID-19    SOB (shortness of breath)    Type 2 diabetes mellitus with hyperlipidemia ([place])    Sepsis ([place]) 02/16/2020   Acute on chronic respiratory failure ([place]) 02/16/2020   Edema of left lower leg 02/16/2020   Pneumonia due to COVID-19 virus 02/15/2020   Acute on chronic diastolic HF (heart failure) ([place]) 11/18/2019   Dyslipidemia 11/18/2019   Polypharmacy 04/13/2019   Goals of care,  counseling/discussion 10/15/2017   ARF (acute renal failure) ([place]) 09/07/2017   Chest pain 09/05/2017  Ineffective health maintenance 06/26/2017   Medication management 06/26/2017   Retinal edema 10/07/2016   Postherpetic neuralgia 12/21/2015   Herpes zoster with complication 56/81/2751   Cellulitis of face 11/27/2015   Anemia of chronic disease 08/17/2015   History of colonic polyps    Hypertensive emergency 05/20/2015   Hypokalemia 05/20/2015   AKI (acute kidney injury) ([place])    Esophageal dysmotility 03/01/2015   Throat congestion 10/11/2014   Morbid obesity due to excess calories ([place]) 09/23/2014   Acute asthmatic bronchitis 05/20/2014   Abdominal pain, other specified site 09/13/2013   Elevated liver enzymes 09/13/2013   Cough 05/05/2013   Pain in lower limb 03/05/2013   DOE (dyspnea on exertion) 02/03/2013   Chronic respiratory failure ([place]) 12/13/2012   Stricture and stenosis of esophagus 08/31/2012   Onychomycosis 06/01/2012   Pain in joint, ankle and foot 06/01/2012   Diabetes mellitus type 2 in obese ([place]) 04/29/2012   Essential hypertension 07/19/2011   CKD (chronic kidney disease), stage III ([place]) 05/19/2011   Family history of malignant neoplasm of gastrointestinal tract 10/30/2010   Bloating 10/16/2010   Epigastric pain 10/16/2010   Foot pain 09/25/2010   Dysphagia 07/16/2010   GERD (gastroesophageal reflux disease) 07/16/2010   Microcytic anemia 07/16/2010   Obstructive sleep apnea 06/16/2009   HYPERCHOLESTEROLEMIA 02/16/2009   PERIPHERAL NEUROPATHY, FEET 09/23/2007   Gastroparesis 08/21/2007   Chronic obstructive asthma ([place]) 08/09/2006    Palliative Care Assessment & Plan   Patient Profile: Per intake H&P --> 64 year old with past medical history significant for ESRD on hemodialysis TTS, anemia, asthma, CAD, chronic diastolic heart failure, pulmonary hypertension, COPD advanced dementia, chronic ambulatory dysfunction, depression, type 2 diabetes,  seizure, chronic respiratory failure on home oxygen, OSA on CPAP, CVA. Was admitted post hemodialysis after witnessed syncopal event with suspected seizure like activity.    Palliative care was asked to get involved to discuss goals of care in the setting of multiple co-morbid conditions.  Assessment: ESRD FTT CHF COPD Advanced dementia Goals of care conversation  Recommendations/Plan: Patient's niece will re-send HCPOA documentation with a seal visible; she is supportive of DNR and transition to comfort care Patient's niece is also worried about repercussions from any decisions made if documentation is approved - she appreciates the support of medical team to clearly set limitations and explain futility of continuing aggressive care to patient's son and sister Psychosocial and emotional support provided Ongoing support from PMT  Goals of Care and Additional Recommendations: Limitations on Scope of Treatment: No Artificial Feeding  Prognosis:  Very worrisome, certainly hospice appropriate if aligned with goc  Discharge Planning: To Be Determined  Care plan was discussed with Patient's niece and the care team  Total time: 55 minutes Greater than 50% of this time was spent in counseling and coordinating care related to the above assessment and plan.  Dorthy Cooler, PA-C Palliative Medicine Team Team phone # [phone]  Thank you for allowing the Palliative Medicine Team to assist in the care of this patient. Please utilize secure chat with additional questions, if there is no response within 30 minutes please call the above phone number.  Palliative Medicine Team providers are available by phone from 7am to 7pm daily and can be reached through the team cell phone.  Should this patient require assistance outside of these hours, please call the patient's attending physician.

## 2020-11-21 NOTE — Progress Notes (Signed)
    Chart reviewed. Patient continues to refuse labs and medications, not making progress with nutrition or mentation.  Attempted to contact patient's sister Wyline Mood to reschedule [place] meeting. Unable to reach. Voicemail left with contact information given.   On 10/31, she informed PMT that she would like to wait until patient's son was available to hold another family meeting. He reportedly has difficulty with coordinating around his school schedule.   PMT will re-attempt to contact family at a later time/date.Thank you for your referral and allowing PMT to assist in [name]'s care.    Dorthy Cooler, [facility] Palliative Medicine Team  Team Phone # [phone]   NO CHARGE

## 2020-11-21 NOTE — Progress Notes (Signed)
   11/21/20 1623  Assess: MEWS Score  Temp 100 F (37.8 C)  BP (!) 147/79  Pulse Rate 83  ECG Heart Rate (!) 113  Resp 16  SpO2 100 %  O2 Device Room Air  Assess: MEWS Score  MEWS Temp 0  MEWS Systolic 0  MEWS Pulse 2  MEWS RR 0  MEWS LOC 0  MEWS Score 2  MEWS Score Color Yellow  Assess: if the MEWS score is Yellow or Red  Were vital signs taken at a resting state? Yes  Focused Assessment No change from prior assessment  Early Detection of Sepsis Score *See Row Information* Low  MEWS guidelines implemented *See Row Information* Yes  Treat  MEWS Interventions Escalated (See documentation below)  Pain Scale PAINAD  Take Vital Signs  Increase Vital Sign Frequency  Yellow: Q 2hr X 2 then Q 4hr X 2, if remains yellow, continue Q 4hrs  Escalate  MEWS: Escalate Yellow: discuss with charge nurse/RN and consider discussing with provider and RRT  Notify: Charge Nurse/RN  Name of Charge Nurse/RN Notified [place], RN  Date Charge Nurse/RN Notified 11/21/20  Time Charge Nurse/RN Notified 1630  Document  Patient Outcome Other (Comment) (monitor vitals)  Progress note created (see row info) Yes

## 2020-11-21 NOTE — Progress Notes (Signed)
Physical Therapy Treatment Patient Details Name: Joann Gutierrez MRN: [id number] DOB: 10-Sep-1945 Today's Date: 11/21/2020   History of Present Illness 75 yo female presenting to ED on 10/20 with syncopal/seizure episode at dialysis center. Chest compressions started and quickly stopped after patient woke up. Head CT negative. PMH including arthma, CHF, DM, depression, dementia with behavioral disturbances, HTN, L TKA (1998), and AV fistula placement (2020).    PT Comments    Pt was seen for mobility but is mildly agitated about being moved at all.  Worked with explanation to pt and demonstration of ROM to legs, and pt either was passive or swatted at PT.  Repositioned her to put legs on pillows in the possibility that heels are sore from the bed, and pt relaxed after this.  Follow for mobility as pt permits, working toward being OOB with lift to the chair for better interaction with staff and family.   Recommendations for follow up therapy are one component of a multi-disciplinary discharge planning process, led by the attending physician.  Recommendations may be updated based on patient status, additional functional criteria and insurance authorization.  Follow Up Recommendations  Skilled nursing-short term rehab (<3 hours/day)     Assistance Recommended at Discharge Frequent or constant Supervision/Assistance  Equipment Recommendations  None recommended by PT    Recommendations for Other Services       Precautions / Restrictions Precautions Precautions: Fall Precaution Comments: attempting to hit at PT at times Restrictions Weight Bearing Restrictions: No     Mobility  Bed Mobility Overal bed mobility: Needs Assistance Bed Mobility: Rolling Rolling: Total assist         General bed mobility comments:  (actively resists PT moving her)    Transfers                   General transfer comment: unable to attempt, maxi move needed    Ambulation/Gait                  Stairs             Wheelchair Mobility    Modified Rankin (Stroke Patients Only)       Balance Overall balance assessment: Needs assistance                                          Cognition Arousal/Alertness: Awake/alert Behavior During Therapy: Flat affect Overall Cognitive Status: History of cognitive impairments - at baseline                                 General Comments: swinging hands in mitts        Exercises General Exercises - Lower Extremity Ankle Circles/Pumps: PROM Heel Slides: PROM;Both Hip ABduction/ADduction: PROM;Both Straight Leg Raises: PROM;Both    General Comments General comments (skin integrity, edema, etc.): pt is reluctant to let PT move her despite talking to her first and telling pt the next movement      Pertinent Vitals/Pain Pain Assessment: Faces Faces Pain Scale: No hurt Breathing: normal Negative Vocalization: none Facial Expression: smiling or inexpressive Body Language: tense, distressed pacing, fidgeting Consolability: no need to console PAINAD Score: 1    Home Living  Prior Function            PT Goals (current goals can now be found in the care plan section) Acute Rehab PT Goals Patient Stated Goal: none stated    Frequency    Min 2X/week      PT Plan Current plan remains appropriate    Co-evaluation              AM-PAC PT "6 Clicks" Mobility   Outcome Measure  Help needed turning from your back to your side while in a flat bed without using bedrails?: Total Help needed moving from lying on your back to sitting on the side of a flat bed without using bedrails?: Total Help needed moving to and from a bed to a chair (including a wheelchair)?: Total Help needed standing up from a chair using your arms (e.g., wheelchair or bedside chair)?: Total Help needed to walk in hospital room?: Total Help needed climbing 3-5  steps with a railing? : Total 6 Click Score: 6    End of Session   Activity Tolerance: Other (comment) (pt is not actively participating in moving legs) Patient left: in bed;with call bell/phone within reach;with bed alarm set Nurse Communication: Mobility status PT Visit Diagnosis: Other abnormalities of gait and mobility (R26.89);Muscle weakness (generalized) (M62.81)     Time: 1027-2536 PT Time Calculation (min) (ACUTE ONLY): 17 min  Charges:  $Therapeutic Exercise: 8-22 mins          Ramond Dial 11/21/2020, 4:54 PM  Mee Hives, PT PhD Acute Rehab Dept. Number: [place] [facility] [place]

## 2020-11-22 DIAGNOSIS — E872 Acidosis, unspecified: Secondary | ICD-10-CM | POA: Diagnosis not present

## 2020-11-22 DIAGNOSIS — I10 Essential (primary) hypertension: Secondary | ICD-10-CM

## 2020-11-22 DIAGNOSIS — Z7189 Other specified counseling: Secondary | ICD-10-CM | POA: Diagnosis not present

## 2020-11-22 DIAGNOSIS — A419 Sepsis, unspecified organism: Secondary | ICD-10-CM | POA: Diagnosis not present

## 2020-11-22 DIAGNOSIS — J9621 Acute and chronic respiratory failure with hypoxia: Secondary | ICD-10-CM | POA: Diagnosis not present

## 2020-11-22 DIAGNOSIS — N186 End stage renal disease: Secondary | ICD-10-CM | POA: Diagnosis not present

## 2020-11-22 NOTE — Progress Notes (Signed)
Occupational Therapy Treatment Patient Details Name: Joann Gutierrez MRN: [id number] DOB: 10/07/1945 Today's Date: 11/22/2020   History of present illness 75 yo female presenting to ED on 10/20 with syncopal/seizure episode at dialysis center. Chest compressions started and quickly stopped after patient woke up. Head CT negative. PMH including arthma, CHF, DM, depression, dementia with behavioral disturbances, HTN, L TKA (1998), and AV fistula placement (2020).   OT comments  Pt alert at times and others closing her eyes and not responding. Attempted to engage pt in simple grooming and drinking with cup/straw, but pt either actively resisting or swinging arms at OT.    Recommendations for follow up therapy are one component of a multi-disciplinary discharge planning process, led by the attending physician.  Recommendations may be updated based on patient status, additional functional criteria and insurance authorization.    Follow Up Recommendations  No OT follow up    Assistance Recommended at Discharge Frequent or [facility] bed;Other (comment) (hoyer lift)    Recommendations for Other Services      Precautions / Restrictions Precautions Precautions: Fall       Mobility Bed Mobility Overal bed mobility: Needs Assistance             General bed mobility comments: +2 total assist to pull up in bed    Transfers                         Balance                                           ADL either performed or assessed with clinical judgement   ADL                                         General ADL Comments: attempted hand over hand assist to drink from straw and to wash face and hands, pt actively resisting and swinging arms at OT at times     Vision       Perception     Praxis      Cognition Arousal/Alertness: Awake/alert Behavior During Therapy:  Flat affect Overall Cognitive Status: History of cognitive impairments - at baseline                                 General Comments: swinging hands in mitts          Exercises     Shoulder Instructions       General Comments      Pertinent Vitals/ Pain       Pain Assessment: Faces Faces Pain Scale: No hurt  Home Living                                          Prior Functioning/Environment              Frequency  Min 1X/week        Progress Toward Goals  OT Goals(current goals can now be found in the care plan section)  Progress towards OT goals: Not progressing toward goals -  comment (agitation/behavioral disturbance)  Acute Rehab OT Goals OT Goal Formulation: Patient unable to participate in goal setting Time For Goal Achievement: 11/28/20 Potential to Achieve Goals: Poor  Plan Frequency remains appropriate    Co-evaluation                 AM-PAC OT "6 Clicks" Daily Activity     Outcome Measure   Help from another person eating meals?: Total Help from another person taking care of personal grooming?: Total Help from another person toileting, which includes using toliet, bedpan, or urinal?: Total Help from another person bathing (including washing, rinsing, drying)?: Total Help from another person to put on and taking off regular upper body clothing?: Total Help from another person to put on and taking off regular lower body clothing?: Total 6 Click Score: 6    End of Session    OT Visit Diagnosis: Muscle weakness (generalized) (M62.81);Other symptoms and signs involving cognitive function   Activity Tolerance Treatment limited secondary to agitation   Patient Left in bed;with call bell/phone within reach;with bed alarm set   Nurse Communication          Time: 9476-5465 OT Time Calculation (min): 14 min  Charges: OT General Charges $OT Visit: 1 Visit OT Treatments $Self Care/Home Management :  8-22 mins  Nestor Lewandowsky, OTR/L Acute Rehabilitation Services Pager: [phone] Office: [phone]  Malka So 11/22/2020, 12:02 PM

## 2020-11-22 NOTE — Progress Notes (Signed)
Nutrition Follow-up  DOCUMENTATION CODES:   Obesity unspecified  INTERVENTION:   Continue Nepro Shake PO TID, each supplement provides 425 kcal and 19 grams protein. Continue Renal MVI daily.  NUTRITION DIAGNOSIS:   Increased nutrient needs related to wound healing, chronic illness (ESRD on HD) as evidenced by estimated needs.  Ongoing   GOAL:   Patient will meet greater than or equal to 90% of their needs  Unmet  MONITOR:   PO intake, Supplement acceptance  REASON FOR ASSESSMENT:   Consult Calorie Count  ASSESSMENT:   75 yo female admitted with syncopal episode with seizure like activity after OP HD session, SIRS, acute on chronic hypoxic respiratory failure. PMH includes ESRD on HD, anemia, asthma, CAD, HF, pulmonary HTN, COPD, advanced dementia, DM-2, seizure, chronic respiratory failure on home oxygen, OSA, CVA.  Patient continues to refuse medications and treatments. She is attempting to strike staff. Plans to discontinue HD after today. Palliative Care team is following. Family has previously declined feeding tube placement. Hopeful for a family meeting soon to discuss goals of care.  Patient remains on a dysphagia 3 diet with thin liquids. Intake has been very minimal over the past several days.  Labs reviewed. Na 134, phos <1 CBG: 219-265  Medications reviewed and include Aranesp, Rena-vit, IV thiamine, K-Phos.  Weight 76.7 kg today.  Diet Order:   Diet Order             DIET DYS 3 Room service appropriate? Yes; Fluid consistency: Thin  Diet effective now                   EDUCATION NEEDS:   No education needs have been identified at this time  Skin:  Skin Assessment: Skin Integrity Issues: Skin Integrity Issues:: Stage II Stage II: L and R buttocks  Last BM:  11/1  Height:   Ht Readings from Last 1 Encounters:  11/10/20 5\' 4"  (1.626 m)    Weight:   Wt Readings from Last 1 Encounters:  11/22/20 76.7 kg    BMI:  Body mass index  is 29.02 kg/m.  Estimated Nutritional Needs:   Kcal:  2000-2300  Protein:  100-120 gm  Fluid:  1 L + UOP    Lucas Mallow, RD, LDN, CNSC Please refer to Amion for contact information.

## 2020-11-22 NOTE — Plan of Care (Signed)
  Problem: Clinical Measurements: Goal: Ability to maintain clinical measurements within normal limits will improve Outcome: Progressing   Problem: Clinical Measurements: Goal: Diagnostic test results will improve Outcome: Progressing   Problem: Clinical Measurements: Goal: Respiratory complications will improve Outcome: Progressing   Problem: Clinical Measurements: Goal: Cardiovascular complication will be avoided Outcome: Progressing   Problem: Nutrition: Goal: Adequate nutrition will be maintained Outcome: Progressing   Problem: Coping: Goal: Level of anxiety will decrease Outcome: Progressing   Problem: Pain Managment: Goal: General experience of comfort will improve Outcome: Progressing   Problem: Safety: Goal: Ability to remain free from injury will improve Outcome: Progressing   Problem: Skin Integrity: Goal: Risk for impaired skin integrity will decrease Outcome: Progressing

## 2020-11-22 NOTE — Progress Notes (Signed)
Daily Progress Note   Patient Name: Joann Gutierrez       Date: 11/22/2020 DOB: [birth year]  Age: 75 y.o. MRN#: [id number] Attending Physician: Kristopher Oppenheim, DO Primary Care Physician: Andree Moro, DO Admit Date: 11/09/2020  Reason for Consultation/Follow-up: Establishing goals of care  Subjective: Medical records reviewed. Patient assessed at the bedside and updates received from nursing. Patient is not responding, grimacing and appears uncomfortable.  Patient's sister [name] and her husband are present at the bedside to meet with myself, Juanell Fairly NP of nephrology, and [facility] Director Livia Snellen RN.  Provided emotional support as patient's family was informed that she is no longer appropriate for dialysis and this is no longer being offered due to futility. Provided education on expected trajectory at end of life and reviewed the difference between palliative care and hospice care. Counseled on the appropriateness of DNR and comfort-focused care with transition to hospice at home for patient to be in her preferred environment. I shared my professional recommendation for beginning this process today and emphasized the urgency of making a decision, as patient is at high risk for more suffering if she were to undergo resuscitation efforts in her current state. Patient's sister [name] shares that patient would not want to go through that and that she would prefer to pass peacefully and naturally if God determined it was her time to die. However, she needs to obtain another copy of HCPOA documentation showing a proper seal and return after speaking with patient's son [name] about this.   I then received a message that patient's son [name] would like to speak further about patient's treatment  options. I called [name] and he shared his passion for "giving my mom a chance" and his wish to continue dialysis. We discussed patient's current illness and recommendations as documented above during my previous conversation with [name]. Gently informed [name] that while patient is at a place in her journey that dialysis is no longer beneficial, she can still receive expert care and has options to manage her symptoms and ensure she is at peace, dignified, and comfortable for the remainder of her life. We reviewed palliative vs hospice again and he understands the end of life hospice philosophy, but prefers palliative approach. Discussed that palliative care would not be enough support for patient to be able  to return home successfully and safely. He is interested in continuing dialysis with hospice at home and I shared that while this is sometimes done in certain situations, it would not change what is offered as an inpatient, would not improve patient's quality of life or overall comfort, and would still be difficult to arrange with her current behaviors and refusal of care. He asks if I have discussed this with [name] and I confirmed the discussion was had this morning. He informs me that he will be speaking with her and that she will call back when they have made a decision together.  Questions and concerns addressed. PMT will continue to support holistically.  Length of Stay: 13  Current Medications: Scheduled Meds:   sodium chloride   Intravenous Once   Chlorhexidine Gluconate Cloth  6 each Topical Q0600   darbepoetin (ARANESP) injection - DIALYSIS  200 mcg Intravenous Q Tue-HD   enoxaparin (LOVENOX) injection  60 mg Subcutaneous Q24H   feeding supplement (NEPRO CARB STEADY)  237 mL Oral TID BM   fluticasone  1 spray Each Nare Daily   mouth rinse  15 mL Mouth Rinse BID   multivitamin  1 tablet Oral QHS   sodium chloride flush  10-40 mL Intracatheter Q12H   thiamine injection  100 mg  Intravenous Daily    Continuous Infusions:  sodium chloride     potassium PHOSPHATE IVPB (in mmol)      PRN Meds: sodium chloride, acetaminophen **OR** acetaminophen, dextrose, haloperidol **OR** haloperidol lactate, ipratropium-albuterol, sodium chloride flush  Physical Exam Vitals and nursing note reviewed.  Constitutional:      General: She is not in acute distress.    Appearance: She is ill-appearing.  Cardiovascular:     Rate and Rhythm: Normal rate.  Pulmonary:     Effort: Pulmonary effort is normal.  Skin:    General: Skin is warm and dry.  Neurological:     Mental Status: She is alert. She is disoriented.            Vital Signs: BP (!) 141/79 (BP Location: Left Leg)   Pulse 96   Temp 99 F (37.2 C) (Axillary)   Resp 17   Ht 5\' 4"  (1.626 m)   Wt 76.7 kg   SpO2 100%   BMI 29.02 kg/m  SpO2: SpO2: 100 % O2 Device: O2 Device: Room Air O2 Flow Rate: O2 Flow Rate (L/min): 4 L/min  Intake/output summary:  Intake/Output Summary (Last 24 hours) at 11/22/2020 1052 Last data filed at 11/21/2020 2050 Gross per 24 hour  Intake 480 ml  Output --  Net 480 ml    LBM: Last BM Date: 11/21/20 Baseline Weight: Weight: 99 kg Most recent weight: Weight: 76.7 kg      Palliative Assessment/Data: 20%      Patient Active Problem List   Diagnosis Date Noted   Lactic acidosis    Pressure injury of skin 11/12/2020   Syncope 11/10/2020   Hypotension 11/10/2020   Hypoglycemia 11/10/2020   Severe sepsis ([place]) 11/09/2020   Delirium 04/10/2020   Adult failure to thrive 04/10/2020   Acute bilat watershed infarction [facility]) 03/31/2020   AMS (altered mental status) 67/61/9509   Acute metabolic encephalopathy 32/67/1245   ESRD on hemodialysis ([place]) 03/09/2020   Thrombocytopenia ([place]) 03/09/2020   Dementia with behavioral disturbance 03/09/2020   COVID-19    SOB (shortness of breath)    Type 2 diabetes mellitus with hyperlipidemia ([place])    Sepsis ([place]) 02/16/2020  Acute on  chronic respiratory failure ([place]) 02/16/2020   Edema of left lower leg 02/16/2020   Pneumonia due to COVID-19 virus 02/15/2020   Acute on chronic diastolic HF (heart failure) ([place]) 11/18/2019   Dyslipidemia 11/18/2019   Polypharmacy 04/13/2019   Goals of care, counseling/discussion 10/15/2017   ARF (acute renal failure) ([place]) 09/07/2017   Chest pain 09/05/2017   Ineffective health maintenance 06/26/2017   Medication management 06/26/2017   Retinal edema 10/07/2016   Postherpetic neuralgia 12/21/2015   Herpes zoster with complication 81/19/1478   Cellulitis of face 11/27/2015   Anemia of chronic disease 08/17/2015   History of colonic polyps    Hypertensive emergency 05/20/2015   Hypokalemia 05/20/2015   AKI (acute kidney injury) ([place])    Esophageal dysmotility 03/01/2015   Throat congestion 10/11/2014   Morbid obesity due to excess calories ([place]) 09/23/2014   Acute asthmatic bronchitis 05/20/2014   Abdominal pain, other specified site 09/13/2013   Elevated liver enzymes 09/13/2013   Cough 05/05/2013   Pain in lower limb 03/05/2013   DOE (dyspnea on exertion) 02/03/2013   Chronic respiratory failure ([place]) 12/13/2012   Stricture and stenosis of esophagus 08/31/2012   Onychomycosis 06/01/2012   Pain in joint, ankle and foot 06/01/2012   Diabetes mellitus type 2 in obese ([place]) 04/29/2012   Essential hypertension 07/19/2011   CKD (chronic kidney disease), stage III ([place]) 05/19/2011   Family history of malignant neoplasm of gastrointestinal tract 10/30/2010   Bloating 10/16/2010   Epigastric pain 10/16/2010   Foot pain 09/25/2010   Dysphagia 07/16/2010   GERD (gastroesophageal reflux disease) 07/16/2010   Microcytic anemia 07/16/2010   Obstructive sleep apnea 06/16/2009   HYPERCHOLESTEROLEMIA 02/16/2009   PERIPHERAL NEUROPATHY, FEET 09/23/2007   Gastroparesis 08/21/2007   Chronic obstructive asthma ([place]) 08/09/2006    Palliative Care Assessment & Plan   Patient  Profile: Per intake H&P --> 36 year old with past medical history significant for ESRD on hemodialysis TTS, anemia, asthma, CAD, chronic diastolic heart failure, pulmonary hypertension, COPD advanced dementia, chronic ambulatory dysfunction, depression, type 2 diabetes, seizure, chronic respiratory failure on home oxygen, OSA on CPAP, CVA. Was admitted post hemodialysis after witnessed syncopal event with suspected seizure like activity.    Palliative care was asked to get involved to discuss goals of care in the setting of multiple co-morbid conditions.  Assessment: ESRD FTT CHF COPD Advanced dementia Goals of care conversation  Recommendations/Plan: Patient's son [name] has been determined to be HCPOA per legal team after review of documents presented by family members Son defers decisions to patient's sister [name] and informs me that [name] will be calling back today with a decision after they speak further amongst themselves  Agree with futility of continuing aggressive interventions and educated family extensively, sharing my recommendation for DNR and comfort care - son is not ready to make a decision at this time PMT remains available acutely on an as-needed basis. Please call team line with urgent needs  Goals of Care and Additional Recommendations: Limitations on Scope of Treatment: No Artificial Feeding  Prognosis:  Very worrisome, certainly hospice appropriate if aligned with goc  Discharge Planning: To Be Determined  Care plan was discussed with Patient's sister, son, and the care team   Time In: 10:15am Time Out: 11:00am Time In: 2:00pm Time Out: 2:45pm Total time: 90 minutes Prolonged time: Yes Greater than 50% of this time was spent in counseling and coordinating care related to the above assessment and plan.  Dorthy Cooler, PA-C Palliative Medicine  Team Team phone # [phone]  Thank you for allowing the Palliative Medicine Team to assist in the care of  this patient. Please utilize secure chat with additional questions, if there is no response within 30 minutes please call the above phone number.  Palliative Medicine Team providers are available by phone from 7am to 7pm daily and can be reached through the team cell phone.  Should this patient require assistance outside of these hours, please call the patient's attending physician.

## 2020-11-22 NOTE — Progress Notes (Signed)
[facility] Progress Note   Subjective: Seen in room. Sister at bedside. Palliative care and primary MD present.    Objective Vitals:   11/21/20 1623 11/21/20 1800 11/21/20 2048 11/22/20 0500  BP: (!) 147/79 (!) 160/84 139/76 (!) 141/79  Pulse: 83 62 (!) 133 96  Resp: 16 14 18 17   Temp: 100 F (37.8 C) 99.5 F (37.5 C) 98.2 F (36.8 C) 99 F (37.2 C)  TempSrc: Oral Axillary Axillary Axillary  SpO2: 100% 91% 98% 100%  Weight:    76.7 kg  Height:       Physical Exam General: Chronically ill appearing elderly female quiet at present but becomes agitated when touched.  Heart:Unable to examine D/T agitation Lungs: Unable to examine D/T agitation Abdomen: Unable to examine D/T agitation Extremities: trace-1+BLE pitting edema.  Dialysis Access: RIJ TDC drsg intact    Additional Objective Labs: Basic Metabolic Panel: Recent Labs  Lab 11/17/20 1031 11/21/20 0825  NA 133* 134*  K 3.9 3.5  CL 98 98  CO2 29 29  GLUCOSE 131* 205*  BUN 8 17  CREATININE 2.67* 3.39*  CALCIUM 8.2* 7.8*  PHOS <1.0* <1.0*   Liver Function Tests: Recent Labs  Lab 11/17/20 1031 11/21/20 0825  ALBUMIN 1.9* 1.8*   No results for input(s): LIPASE, AMYLASE in the last 168 hours. CBC: Recent Labs  Lab 11/16/20 0046 11/17/20 0354 11/18/20 0214 11/20/20 0216 11/21/20 0825  WBC 8.1 10.4 11.2* 12.5* 12.6*  HGB 8.1* 8.2* 8.3* 8.3* 8.0*  HCT 24.8* 24.5* 25.4* 24.9* 24.7*  MCV 76.8* 75.6* 77.0* 76.6* 79.4*  PLT 116* 159 162 184 176   Blood Culture    Component Value Date/Time   SDES BLOOD LEFT ANTECUBITAL 11/09/2020 1734   SPECREQUEST  11/09/2020 1734    BOTTLES DRAWN AEROBIC AND ANAEROBIC Blood Culture adequate volume   CULT  11/09/2020 1734    NO GROWTH 5 DAYS Performed at [facility], [place] [address] [place], [place] [address]    REPTSTATUS 11/14/2020 FINAL 11/09/2020 1734    Cardiac Enzymes: No results for input(s): CKTOTAL, CKMB, CKMBINDEX, TROPONINI in  the last 168 hours. CBG: Recent Labs  Lab 11/20/20 1832 11/20/20 2141 11/21/20 1116 11/21/20 1623 11/21/20 2137  GLUCAP 248* 177* 135* 219* 265*   Iron Studies: No results for input(s): IRON, TIBC, TRANSFERRIN, FERRITIN in the last 72 hours. @lablastinr3 @ Studies/Results: No results found. Medications:  sodium chloride     potassium PHOSPHATE IVPB (in mmol)      sodium chloride   Intravenous Once   Chlorhexidine Gluconate Cloth  6 each Topical Q0600   darbepoetin (ARANESP) injection - DIALYSIS  200 mcg Intravenous Q Tue-HD   enoxaparin (LOVENOX) injection  60 mg Subcutaneous Q24H   feeding supplement (NEPRO CARB STEADY)  237 mL Oral TID BM   fluticasone  1 spray Each Nare Daily   mouth rinse  15 mL Mouth Rinse BID   multivitamin  1 tablet Oral QHS   sodium chloride flush  10-40 mL Intracatheter Q12H   thiamine injection  100 mg Intravenous Daily     Dialysis Orders: Center: [facility]  on TTS . 180NRe, 4 hours, BFR 400, DFR 500, EDW 77kg 4K, 2.25Ca, TDC Heparin 6000 unit bolus Mircera 263mcg IV q 2 weeks- last dose 10/31/20 Hectorol 20mcg IV q HD    Problem/Plan: Syncope/PE: Admit hypotensive/now stable BP, on admit given 1.5L IVF total. CTA showed PE. LE US performed (+) DVT left common femoral vein. Started on  heparin  AMS-dementia at baseline with intermittent agitation and physical aggression toward staff. Concern that she is not safe for IHD D/T pulling lines and striking at staff. Palliative care meeting today.   ESRD:  TTS schedule. HD today. Patient is no longer appropriate for IHD D/T agitation.  Planned to hold HD but orders not Dc'd. Will not have HD again after today. Attempting to strike staff. Not taking medications or accepting any treatments. At this point, HD is futile.  Hypophosphatemia - PO4 now less than 1. ordered IV KPhos but pt refused IV placement. Not taking PO meds.   Hypertension/volume: BP better controlled at end of HD. trace to 1+ BLE edema. UFG 1.5  L.   Anemia: Hgb 8.0 11/21/20.  s/p 1 unit PRBC 10/22 for Hgb 7-, now stable. Monitor trend. Pt was on max ESA outpatient and last dose was on 10/11-now on aranesp here q Tuesday.  .  Metabolic bone disease: Calcium controlled Last PO4 <1.0. Apparently refusing PO and IV meds.  Nutrition:  albumin 1.9 little p.o. intake noted family did refuse feeding tube with recent palliative care meeting   Dispo: Plan of care meeting scheduled twice with family but meeting still has not taken place.   Patient is combative to staff, refusing all meds including eliquis.  Refusing IV access. Patient's POA at bedside, discussing situation with Palliative Care and primary. We will stop HD and sign off.   Braidan Ricciardi H. Tab Rylee NP-C 11/22/2020, 10:26 AM  [employer] [phone]

## 2020-11-22 NOTE — Progress Notes (Signed)
Family members [name] and [name] stated they would be at the hospital this evening to talk with palliative and MD Bridgett Larsson. Family members have not showed up.
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
## 2020-11-22 NOTE — Progress Notes (Addendum)
CSW received message from Network engineer that patients son Joann Gutierrez would like for CSW or Case manager to give him a call. CSW called Joann Gutierrez patients son. Joann Gutierrez expressed concerns to [place] regarding patient.CSW informed patients son that CSW will have palliative to give him a call to discuss his concerns as well as to help him understand patients current medical status. Patients son agreed to receive call from palliative. Patients son thanked CSW. No further questions reported at this time. CSW informed MD and palliative team. Palliative confirmed they will give patients son a call. CSW will continue to follow and assist with patients dc planning needs.

## 2020-11-23 DIAGNOSIS — Z515 Encounter for palliative care: Secondary | ICD-10-CM | POA: Diagnosis not present

## 2020-11-23 DIAGNOSIS — Z66 Do not resuscitate: Secondary | ICD-10-CM | POA: Diagnosis not present

## 2020-11-23 DIAGNOSIS — Z7189 Other specified counseling: Secondary | ICD-10-CM | POA: Diagnosis not present

## 2020-11-23 DIAGNOSIS — N186 End stage renal disease: Secondary | ICD-10-CM | POA: Diagnosis not present

## 2020-11-23 MED ORDER — GLYCOPYRROLATE 1 MG PO TABS
1.0000 mg | ORAL_TABLET | ORAL | Status: DC | PRN
  Filled 2020-11-23: qty 1

## 2020-11-23 MED ORDER — OXYCODONE HCL 20 MG/ML PO CONC: 5.0000 mg | ORAL | Status: DC | PRN

## 2020-11-23 MED ORDER — BIOTENE DRY MOUTH MT LIQD: 15.0000 mL | OROMUCOSAL | 0 refills | Status: DC | PRN

## 2020-11-23 MED ORDER — ONDANSETRON 4 MG PO TBDP
4.0000 mg | ORAL_TABLET | Freq: Four times a day (QID) | ORAL | Status: DC | PRN
  Filled 2020-11-23: qty 1

## 2020-11-23 MED ORDER — POLYVINYL ALCOHOL 1.4 % OP SOLN
1.0000 [drp] | Freq: Four times a day (QID) | OPHTHALMIC | Status: DC | PRN
  Filled 2020-11-23: qty 15

## 2020-11-23 MED ORDER — ONDANSETRON 4 MG PO TBDP: 4.0000 mg | ORAL_TABLET | Freq: Four times a day (QID) | ORAL | 0 refills | Status: DC | PRN

## 2020-11-23 MED ORDER — GLYCOPYRROLATE 0.2 MG/ML IJ SOLN: 0.2000 mg | INTRAMUSCULAR | Status: DC | PRN

## 2020-11-23 MED ORDER — OXYCODONE HCL 20 MG/ML PO CONC: 6.0000 mg | ORAL | 0 refills | Status: DC | PRN

## 2020-11-23 MED ORDER — GLYCOPYRROLATE 1 MG PO TABS: 1.0000 mg | ORAL_TABLET | ORAL | 0 refills | Status: DC | PRN

## 2020-11-23 MED ORDER — BIOTENE DRY MOUTH MT LIQD: 15.0000 mL | OROMUCOSAL | Status: DC | PRN

## 2020-11-23 MED ORDER — POLYVINYL ALCOHOL 1.4 % OP SOLN: 1.0000 [drp] | Freq: Four times a day (QID) | OPHTHALMIC | 0 refills | Status: DC | PRN

## 2020-11-23 MED ORDER — SENNOSIDES 8.6 MG PO TABS: 2.0000 | ORAL_TABLET | Freq: Two times a day (BID) | ORAL | 0 refills | Status: DC

## 2020-11-23 MED ORDER — ONDANSETRON HCL 4 MG/2ML IJ SOLN: 4.0000 mg | Freq: Four times a day (QID) | INTRAMUSCULAR | Status: DC | PRN

## 2020-11-23 MED ORDER — ACETAMINOPHEN 325 MG PO TABS
650.0000 mg | ORAL_TABLET | Freq: Four times a day (QID) | ORAL | Status: DC | PRN
Start: 2020-11-23 — End: 2020-12-31

## 2020-11-23 MED ORDER — HALOPERIDOL 0.5 MG PO TABS: 0.5000 mg | ORAL_TABLET | Freq: Four times a day (QID) | ORAL | 0 refills | Status: DC | PRN

## 2020-11-23 NOTE — Progress Notes (Signed)
Daily Progress Note   Patient Name: Joann Gutierrez       Date: 11/23/2020 DOB: March 01, 1945  Age: 75 y.o. MRN#: [id number] Attending Physician: Ezekiel Slocumb, DO Primary Care Physician: Andree Moro, DO Admit Date: 11/09/2020  Reason for Consultation/Follow-up: Establishing goals of care  Subjective: Called by unit director and asked to come to family meeting as patient's medical decision maker is at bedside.  Medical records reviewed. Patient assessed at the bedside and updates received from nursing. Patient unable to participate in goals of care conversations.   Per chart review, patient's son [name] is HCPOA however he has deferred all decisions to patient's sister [name].   Went to unit and joined family meeting to include patient's sister and other family member, nephrology, hospitalist, and Therapist, sports.   Medical team reviewed that Ms. Fox continues to not be a good dialysis candidate. Reviewed that continued aggressive care will not benefit Ms. Gonterman and she is also refusing most care offered - becoming physically combative with staff.   Family encouraged to consider DNR/DNI status understanding evidenced based poor outcomes in similar hospitalized patients, as the cause of the arrest is likely associated with chronic/terminal disease rather than a reversible acute cardio-pulmonary event.   Family shares that main goal is to get patient home and ask how they can get her home.   We review that the only safe option to get her home is by accepting that patient is at end of life and involving hospice support at home to ensure she receives the appropriate care at home. Philosophy of hospice and type of support provided was reviewed.   Education provided on expected  trajectory at end of life and expectations during patient's final days.   Patient's family agrees to code status change to DNR and hospice support at home.   I ask [name] if she would like medical team to call patient's son [name] and she tells [place] that she will speak to him about conversation and let us know if there are any concerns. She agrees that we move forward with referral to hospice.   Questions and concerns addressed. PMT will continue to support holistically.  Length of Stay: 14  Current Medications: Scheduled Meds:   sodium chloride   Intravenous Once   feeding supplement (NEPRO CARB STEADY)  237  mL Oral TID BM   fluticasone  1 spray Each Nare Daily   mouth rinse  15 mL Mouth Rinse BID   sodium chloride flush  10-40 mL Intracatheter Q12H    Continuous Infusions:  sodium chloride      PRN Meds: sodium chloride, acetaminophen **OR** acetaminophen, antiseptic oral rinse, glycopyrrolate **OR** glycopyrrolate **OR** glycopyrrolate, haloperidol **OR** haloperidol lactate, ipratropium-albuterol, ondansetron **OR** ondansetron (ZOFRAN) IV, oxyCODONE, polyvinyl alcohol, sodium chloride flush  Physical Exam Vitals and nursing note reviewed.  Constitutional:      General: She is not in acute distress.    Comments: Does not verbally respond to me, looks at me, does not follow commands  Pulmonary:     Effort: Pulmonary effort is normal. No respiratory distress.  Musculoskeletal:     Right lower leg: Edema present.     Left lower leg: Edema present.  Skin:    General: Skin is warm and dry.  Neurological:     Mental Status: She is alert. She is disoriented.            Vital Signs: BP (!) 156/69 (BP Location: Right Arm)   Pulse 85   Temp 97.9 F (36.6 C) (Axillary)   Resp 17   Ht 5\' 4"  (1.626 m)   Wt 76.7 kg   SpO2 98%   BMI 29.02 kg/m  SpO2: SpO2: 98 % O2 Device: O2 Device: Room Air O2 Flow Rate: O2 Flow Rate (L/min): 4 L/min  Intake/output summary:  Intake/Output  Summary (Last 24 hours) at 11/23/2020 1228 Last data filed at 11/22/2020 2100 Gross per 24 hour  Intake 240 ml  Output --  Net 240 ml    LBM: Last BM Date: 11/21/20 Baseline Weight: Weight: 99 kg Most recent weight: Weight: 76.7 kg      Palliative Assessment/Data: 20%      Patient Active Problem List   Diagnosis Date Noted   Lactic acidosis    Pressure injury of skin 11/12/2020   Syncope 11/10/2020   Hypotension 11/10/2020   Hypoglycemia 11/10/2020   Severe sepsis ([place]) 11/09/2020   Delirium 04/10/2020   Adult failure to thrive 04/10/2020   Acute bilat watershed infarction [facility]) 03/31/2020   AMS (altered mental status) 73/53/2992   Acute metabolic encephalopathy 42/68/3419   ESRD on hemodialysis ([place]) 03/09/2020   Thrombocytopenia ([place]) 03/09/2020   Dementia with behavioral disturbance 03/09/2020   COVID-19    SOB (shortness of breath)    Type 2 diabetes mellitus with hyperlipidemia ([place])    Sepsis ([place]) 02/16/2020   Acute on chronic respiratory failure ([place]) 02/16/2020   Edema of left lower leg 02/16/2020   Pneumonia due to COVID-19 virus 02/15/2020   Acute on chronic diastolic HF (heart failure) ([place]) 11/18/2019   Dyslipidemia 11/18/2019   Polypharmacy 04/13/2019   Goals of care, counseling/discussion 10/15/2017   ARF (acute renal failure) ([place]) 09/07/2017   Chest pain 09/05/2017   Ineffective health maintenance 06/26/2017   Medication management 06/26/2017   Retinal edema 10/07/2016   Postherpetic neuralgia 12/21/2015   Herpes zoster with complication 62/22/9798   Cellulitis of face 11/27/2015   Anemia of chronic disease 08/17/2015   History of colonic polyps    Hypertensive emergency 05/20/2015   Hypokalemia 05/20/2015   AKI (acute kidney injury) ([place])    Esophageal dysmotility 03/01/2015   Throat congestion 10/11/2014   Morbid obesity due to excess calories ([place]) 09/23/2014   Acute asthmatic bronchitis 05/20/2014   Abdominal pain, other specified site  09/13/2013  Elevated liver enzymes 09/13/2013   Cough 05/05/2013   Pain in lower limb 03/05/2013   DOE (dyspnea on exertion) 02/03/2013   Chronic respiratory failure ([place]) 12/13/2012   Stricture and stenosis of esophagus 08/31/2012   Onychomycosis 06/01/2012   Pain in joint, ankle and foot 06/01/2012   Diabetes mellitus type 2 in obese ([place]) 04/29/2012   Essential hypertension 07/19/2011   CKD (chronic kidney disease), stage III ([place]) 05/19/2011   Family history of malignant neoplasm of gastrointestinal tract 10/30/2010   Bloating 10/16/2010   Epigastric pain 10/16/2010   Foot pain 09/25/2010   Dysphagia 07/16/2010   GERD (gastroesophageal reflux disease) 07/16/2010   Microcytic anemia 07/16/2010   Obstructive sleep apnea 06/16/2009   HYPERCHOLESTEROLEMIA 02/16/2009   PERIPHERAL NEUROPATHY, FEET 09/23/2007   Gastroparesis 08/21/2007   Chronic obstructive asthma ([place]) 08/09/2006    Palliative Care Assessment & Plan   Patient Profile: Per intake H&P --> 89 year old with past medical history significant for ESRD on hemodialysis TTS, anemia, asthma, CAD, chronic diastolic heart failure, pulmonary hypertension, COPD advanced dementia, chronic ambulatory dysfunction, depression, type 2 diabetes, seizure, chronic respiratory failure on home oxygen, OSA on CPAP, CVA. Was admitted post hemodialysis after witnessed syncopal event with suspected seizure like activity.    Palliative care was asked to get involved to discuss goals of care in the setting of multiple co-morbid conditions.  Assessment: ESRD FTT CHF COPD Advanced dementia Goals of care conversation  Recommendations/Plan: Son is HCPOA but has deferred decision making to patient's sister [name] After extensive discussion with multiple members of medical team [name] agrees to DNR and hospice support at home - would like patient to go home today if possible - TOC is working on hospice setup Agree with futility of continuing  aggressive interventions and educated family extensively, sharing my recommendation for DNR and comfort care    Prognosis:  <2 weeks  Discharge Planning: Home with Hospice  Care plan was discussed with Patient's sister and the care team  60 minutes  Greater than 50%  of this time was spent counseling and coordinating care related to the above assessment and plan.  Juel Burrow, DNP, AGNP-C Palliative Medicine Team Team Phone # [phone]  Pager # [phone]  Thank you for allowing the Palliative Medicine Team to assist in the care of this patient. Please utilize secure chat with additional questions, if there is no response within 30 minutes please call the above phone number.  Palliative Medicine Team providers are available by phone from 7am to 7pm daily and can be reached through the team cell phone.  Should this patient require assistance outside of these hours, please call the patient's attending physician.

## 2020-11-23 NOTE — Progress Notes (Addendum)
[name]) Hospital Liaison Note  Notified by Transition of Care Manger request for [name] services at home after discharge. Chart and patient information under review by [facility] physician. Hospice eligibility approved.   Writer spoke with Joann Gutierrez(son) [phone] to initiate education related to hospice philosophy, services and team approach to care. Joann Gutierrez verbalized understanding of information given. Per discussion, plan is for discharge to home by [name]). Please send signed and completed DNR form home with patient/family. Patient will need prescriptions for discharge comfort medications.     DME needs have been discussed, patient currently has the following equipment in the home: hospital bed, hoyer lift, and wheelchair. Patient/family requests the following DME for delivery to the home:hoyer pad and over bed table. [name] equipment manager has been notified and will contact DME provider to arrange delivery to the home. Home address has been verified and is correct in the chart.  Joann Gutierrez(significant other) [phone] is the family member to contact to arrange time of delivery.     [name] aware of the above. [facility] information and contact numbers given to [name].      Please call with any hospice related questions.    Thank you for this referral.     Joann Gutierrez [facility] Liaison  [phone]

## 2020-11-23 NOTE — Progress Notes (Signed)
Physical Therapy Treatment Patient Details Name: Joann Gutierrez MRN: [id number] DOB: [birth year] Today's Date: 11/23/2020   History of Present Illness 75 yo female presenting to ED on 10/20 with syncopal/seizure episode at dialysis center. Chest compressions started and quickly stopped after patient woke up. Head CT negative. PMH including arthma, CHF, DM, depression, dementia with behavioral disturbances, HTN, L TKA (1998), and AV fistula placement (2020).    PT Comments    Pt was much more agreeable to participate this date, not displaying any aggressive behaviors towards PT this session. Pt appears to be [facility] and displays delayed processing with poor awareness. Pt assisting with lower extremity mobility during functional mobility, but displays weakness resulting in her requiring maxAx2 to complete all bed mobility tasks. Unable to attempt to stand this date. Pt requiring min-maxA to sit EOB with L UE support and feed self with hand-over-hand facilitation. If pt were to discharge home, she would need lift equipment, hospital bed, w/c, and 3-in-1. Continue to recommend SNF. Will continue to follow acutely.    Recommendations for follow up therapy are one component of a multi-disciplinary discharge planning process, led by the attending physician.  Recommendations may be updated based on patient status, additional functional criteria and insurance authorization.  Follow Up Recommendations  Skilled nursing-short term rehab (<3 hours/day)     Assistance Recommended at Discharge Frequent or constant Supervision/Assistance  Equipment Recommendations  Wheelchair (measurements PT);Wheelchair cushion (measurements PT);Hospital bed;3in1 (PT) (lift equipment; unknown home equipment)    Recommendations for Other Services       Precautions / Restrictions Precautions Precautions: Fall Precaution Comments: attempting to hit at PT at times (not this session); R hand mitten; HOH Restrictions Weight  Bearing Restrictions: No     Mobility  Bed Mobility Overal bed mobility: Needs Assistance Bed Mobility: Supine to Sit;Sit to Supine     Supine to sit: Max assist;+2 for physical assistance;+2 for safety/equipment;HOB elevated Sit to supine: Max assist;+2 for physical assistance;+2 for safety/equipment;HOB elevated   General bed mobility comments: Pt able to follow cues to assist in bringing legs towards L EOB, but poor assist to ascend trunk, maxAx2.    Transfers                   General transfer comment: Unable to attempt this date    Ambulation/Gait             General Gait Details: Unable   Stairs             Wheelchair Mobility    Modified Rankin (Stroke Patients Only) Modified Rankin (Stroke Patients Only) Pre-Morbid Rankin Score: Moderately severe disability Modified Rankin: Severe disability     Balance Overall balance assessment: Needs assistance Sitting-balance support: Single extremity supported Sitting balance-[name]: Poor Sitting balance - Comments: Pt with R lateral lean and extension of L UE on bed surface. Cues provided to flex L elbow and hold onto bed rail on L to improve upright posture and balance, fair success. Tactile and verbal cues provided to flex trunk and neck laterally to L, brief moments of minA or maxA but primarily modA to sit statically and reach dynamically min off BOS with R UE with hand over hand to feed self. Postural control: Right lateral lean     Standing balance comment: Unable                            Cognition Arousal/Alertness: Awake/alert Behavior During  Therapy: Flat affect Overall Cognitive Status: History of cognitive impairments - at baseline                                 General Comments: Pt appears to be [facility], impacting her ability to understand some cues. Pt following simple multi-modal cues with increased time to process, inconsistently. Pt with poor awareness  into spatial positioning.        Exercises      General Comments        Pertinent Vitals/Pain Pain Assessment: Faces Faces Pain Scale: Hurts a little bit Breathing: normal Negative Vocalization: none Facial Expression: smiling or inexpressive Body Language: relaxed Consolability: no need to console PAINAD Score: 0 Pain Location: generalized grimacing/guarding with mobility Pain Descriptors / Indicators: Discomfort;Grimacing;Guarding Pain Intervention(s): Limited activity within patient's tolerance;Monitored during session;Repositioned    Home Living                          Prior Function            PT Goals (current goals can now be found in the care plan section) Acute Rehab PT Goals Patient Stated Goal: agreeable to sit up to eat PT Goal Formulation: With patient Time For Goal Achievement: 11/30/20 Potential to Achieve Goals: Fair Progress towards PT goals: Progressing toward goals    Frequency    Min 2X/week      PT Plan Current plan remains appropriate;Equipment recommendations need to be updated    Co-evaluation              AM-PAC PT "6 Clicks" Mobility   Outcome Measure  Help needed turning from your back to your side while in a flat bed without using bedrails?: Total Help needed moving from lying on your back to sitting on the side of a flat bed without using bedrails?: Total Help needed moving to and from a bed to a chair (including a wheelchair)?: Total Help needed standing up from a chair using your arms (e.g., wheelchair or bedside chair)?: Total Help needed to walk in hospital room?: Total Help needed climbing 3-5 steps with a railing? : Total 6 Click Score: 6    End of Session   Activity Tolerance: Patient tolerated treatment well Patient left: in bed;with call bell/phone within reach;with bed alarm set Nurse Communication: Mobility status PT Visit Diagnosis: Other abnormalities of gait and mobility (R26.89);Muscle  weakness (generalized) (M62.81)     Time: 5284-1324 PT Time Calculation (min) (ACUTE ONLY): 21 min  Charges:  $Therapeutic Activity: 8-22 mins                     Moishe Spice, PT, DPT Acute Rehabilitation Services  Pager: [phone] Office: [place] 11/23/2020, 11:12 AM

## 2020-11-23 NOTE — Progress Notes (Signed)
Per the Nicole Kindred, DO via secure chat, the HD tunneled catheter doesn't need to come out today. Awaiting for PTAR. For DC today. Hinton Dyer, RN and [place], RN aware.

## 2020-11-23 NOTE — TOC Initial Note (Addendum)
Transition of Care [facility]) - Initial/Assessment Note    Patient Details  Name: Joann Gutierrez MRN: [id number] Date of Birth: 15-Apr-1945  Transition of [facility]) CM/SW Contact:    Bethena Roys, RN Phone Number: 11/23/2020, 11:47 AM  Clinical Narrative: Case Manager and Unit Director had spoken to [facility] and her husband earlier in the morning regarding the patients plan of care for home with palliative and home health services. Family has decided to use [facility]. Case Manager then received notification from the Palliative NP that the family has decided to take the patient home with Hospice Services. Case Manager called [place] to make them aware that the services have changed to [facility] plan for d/c today. Patient has durable medical equipment (DME) hospital bed, wheelchair, hoyer lift, and pad. Family states the pad was misplaced when the patient came to the ED. [facility] is aware to assist with a new pad in the home and additional DME such as oxygen and overbed table. Family has stated that patient will need PTAR for transport home-Case Manager will arrange. No further needs from Case Manager at this time.    1534 11-23-20 Case Manager was unable to speak with [name] regarding additional plan of care with Hospice and transportation home- Case Manager called several times and texted; however, no response. Case Manager was able to speak with the patient's son [name] and Hubbard Robinson. Hubbard Robinson is aware that PTAR will transport home and is agreeable for Case Manager to call-states he will be in the home all day. PTAR ETA is 7:00 pm for pickup from the hospital to transport home. [place] is reaching out to family to arrange a visit time; either tonight vs the am. Staff RN please call [name] once [place] arrives and has the patient en route to home. No further needs identified at this time via Case Manager.   Expected Discharge Plan: Home w  Hospice Care Barriers to Discharge: No Barriers Identified   Patient Goals and CMS Choice Patient states their goals for this hospitalization and ongoing recovery are:: to go with hospice services.   Choice offered to / list presented to : [facility] POA / Guardian  Expected Discharge Plan and Services Expected Discharge Plan: Home w Hospice Care In-house Referral: Hospice / Palliative Care, Ehtics Consult, Clinical Social Work Discharge Planning Services: CM Consult Post Acute Care Choice: [place] arrangements for the past 2 months: Single Family Home                 [facility]         HH Arranged: RN (Hospice RN) [place]: Hospice and [place] (Known as Cabin crew.) Date [place]: 11/23/20 Time [place]: 1130 Representative spoke with at [place]: [place]  Prior Living Arrangements/Services Living arrangements for the past 2 months: [place] with:: Friends, Relatives Patient language and need for interpreter reviewed:: Yes Do you feel safe going back to the place where you live?: Yes      Need for Family Participation in Patient Care: Yes (Comment) Care giver support system in place?: Yes (comment) Current home services: DME (Patient has hospital bed, wheelchair, and hoyer lift will need pad.) Criminal Activity/Legal Involvement Pertinent to Current Situation/Hospitalization: No - Comment as needed  Activities of Daily Living   ADL Screening (condition at time of admission) Patient's cognitive ability adequate to safely  complete daily activities?: No  Permission Sought/Granted Permission sought to share information with : Family Supports, Customer service manager, Chief Strategy Officer granted to share information with : Yes, Verbal Permission Granted     Permission granted to share info w AGENCY: [facility]         Emotional Assessment   Attitude/Demeanor/Rapport: Unable to Assess Affect (typically observed): Unable to Assess Orientation: : Oriented to Self Alcohol / Substance Use: Not Applicable Psych Involvement: No (comment)  Admission diagnosis:  Lactic acidosis [E87.20] Syncope [R55] Hypoglycemia [E16.2] Severe sepsis ([place]) [A41.9, R65.20] Altered mental status, unspecified altered mental status type [R41.82] Patient Active Problem List   Diagnosis Date Noted   Lactic acidosis    Pressure injury of skin 11/12/2020   Syncope 11/10/2020   Hypotension 11/10/2020   Hypoglycemia 11/10/2020   Severe sepsis ([place]) 11/09/2020   Delirium 04/10/2020   Adult failure to thrive 04/10/2020   Acute bilat watershed infarction [facility]) 03/31/2020   AMS (altered mental status) 96/04/5407   Acute metabolic encephalopathy 81/19/1478   ESRD on hemodialysis ([place]) 03/09/2020   Thrombocytopenia ([place]) 03/09/2020   Dementia with behavioral disturbance 03/09/2020   COVID-19    SOB (shortness of breath)    Type 2 diabetes mellitus with hyperlipidemia ([place])    Sepsis ([place]) 02/16/2020   Acute on chronic respiratory failure ([place]) 02/16/2020   Edema of left lower leg 02/16/2020   Pneumonia due to COVID-19 virus 02/15/2020   Acute on chronic diastolic HF (heart failure) ([place]) 11/18/2019   Dyslipidemia 11/18/2019   Polypharmacy 04/13/2019   Goals of care, counseling/discussion 10/15/2017   ARF (acute renal failure) ([place]) 09/07/2017   Chest pain 09/05/2017   Ineffective health maintenance 06/26/2017   Medication management 06/26/2017   Retinal edema 10/07/2016   Postherpetic neuralgia 12/21/2015   Herpes zoster with complication 29/56/2130   Cellulitis of face 11/27/2015   Anemia of chronic disease 08/17/2015   History of colonic polyps    Hypertensive emergency 05/20/2015   Hypokalemia 05/20/2015   AKI (acute kidney injury) ([place])    Esophageal dysmotility 03/01/2015   Throat congestion 10/11/2014    Morbid obesity due to excess calories ([place]) 09/23/2014   Acute asthmatic bronchitis 05/20/2014   Abdominal pain, other specified site 09/13/2013   Elevated liver enzymes 09/13/2013   Cough 05/05/2013   Pain in lower limb 03/05/2013   DOE (dyspnea on exertion) 02/03/2013   Chronic respiratory failure ([place]) 12/13/2012   Stricture and stenosis of esophagus 08/31/2012   Onychomycosis 06/01/2012   Pain in joint, ankle and foot 06/01/2012   Diabetes mellitus type 2 in obese ([place]) 04/29/2012   Essential hypertension 07/19/2011   CKD (chronic kidney disease), stage III ([place]) 05/19/2011   Family history of malignant neoplasm of gastrointestinal tract 10/30/2010   Bloating 10/16/2010   Epigastric pain 10/16/2010   Foot pain 09/25/2010   Dysphagia 07/16/2010   GERD (gastroesophageal reflux disease) 07/16/2010   Microcytic anemia 07/16/2010   Obstructive sleep apnea 06/16/2009   HYPERCHOLESTEROLEMIA 02/16/2009   PERIPHERAL NEUROPATHY, FEET 09/23/2007   Gastroparesis 08/21/2007   Chronic obstructive asthma ([place]) 08/09/2006   PCP:  Andree Moro, DO Pharmacy:  No Pharmacies Listed  Readmission Risk Interventions No flowsheet data found.

## 2020-11-23 NOTE — Progress Notes (Addendum)
  [facility] Progress Note   Extensive conversations this AM with family, primary MD, Nephrology, Palliative Care. Decision as of right now is that we will stop hemodialysis and patient will transition to comfort care with hospice. Appreciate extraordinary efforts of medical, nursing and Palliative Care staff to help family reach most compassionate and dignified plan of care for this patient.    [facility]  Pt seen, examined and agree w A/P as above. Will sign off.  Kelly Splinter  MD 11/23/2020, 2:50 PM

## 2020-11-23 NOTE — Progress Notes (Signed)
Summary of conversations and work with patient on 11/22/2020:     In the morning I met with the patient's sister Joann Gutierrez and her husband along with Joann Fairly NP and Joann Cooler PA.  In this meeting Joann Gutierrez was made aware that the medical team did not believe the patient Joann Gutierrez was a candidate for HD anymore.  Joann Gutierrez was provided support explaining that since admission Joann Gutierrez had not been a participant in her care, refusing medications, demonstrating aggressive behaviors towards staff, and pulling out IVs.  It was shared with Joann Gutierrez that her behaviors and participation in care were needed to maintain current therapy.  The sequela of what will occur with stopping of HD was discussed with Joann Gutierrez and goals of care were revisited in addition to code status.  Joann Gutierrez shared that Joann Gutierrez's son Joann Gutierrez who was not present would want his mom to come home and be around family and that she would go and speak him and get back to the team.    There were still questions about who was Joann Gutierrez's decision maker at this time as there were two competing parties that had submitted paper work claiming to be the [employer].  The paper work Joann Gutierrez had submitted lacked the notary seal on it and I made her aware we would need to see the original or a copy where we could evaluate it and she agreed to return later in the morning with the paper work with the seal on it.    Joann Gutierrez returned and interacted with the nurse secretary dropping off paperwork and sharing that Joann Gutierrez wanted to speak with a Education officer, museum.  The paper work Joann Gutierrez dropped off was evaluated and now Joann Gutierrez.    I spoke with Joann Gutierrez in the afternoon over the phone to support him and answer questions as well as attempt to coordinate a meeting with the medical team to discuss plan of care and code status.  Joann Gutierrez reported he had questions about why HD was stopped, and verbalized that Joann Gutierrez can get her to eat and take her medications and that all of his  mothers problems are related to her being in the hospital and she was fine at home.  I attempted to present to Joann Gutierrez what it looks like if Joann Gutierrez is the only person the the patient will eat or take medications from and the challenges that presented.  Joann Gutierrez reported he is on disability and can take care of her.  I invited Joann Gutierrez to come and be with her at the hospital to see if that would help.  I shared that family being around could be very helpful and that his mother has been laying alone in her room form the majority of her hospital stay.  Joann Gutierrez reported that he does not have transportation.  To end our call Joann Gutierrez informed me he and Joann Gutierrez would come to the hospital to meet with the physician when Joann Gutierrez got off work.  Joann Gutierrez shared this would be around 5PM to 6 PM.   I connected with Dr. Bridgett Larsson and he agreed to be available until 6PM to meet with Joann Gutierrez and Joann Gutierrez.  Joann Gutierrez and Joann Gutierrez did not show for this meeting.    Based on collaboration with the medical team on 11/22/2020 a consult to the ethics committee was made today on 11/23/2020.  Joann Canal RN, BSN, CCRN-K

## 2020-11-23 NOTE — Progress Notes (Signed)
Daily Progress Note   Patient Name: Joann Gutierrez       Date: 11/23/2020 DOB: Sep 15, 1945  Age: 75 y.o. MRN#: [id number] Attending Physician: Ezekiel Slocumb, DO Primary Care Physician: Andree Moro, DO Admit Date: 11/09/2020  Reason for Consultation/Follow-up: Establishing goals of care  Subjective: Called by unit director and asked to come to family meeting as patient's medical decision maker is at bedside.  Medical records reviewed. Patient assessed at the bedside and updates received from nursing. Patient unable to participate in goals of care conversations.   Per chart review, patient's son [name] is HCPOA however he has deferred all decisions to patient's sister [name].   Went to unit and joined family meeting to include patient's sister and other family member, nephrology, hospitalist, and Therapist, sports.   Medical team reviewed that Ms. Rought continues to not be a good dialysis candidate. Reviewed that continued aggressive care will not benefit Ms. Schmale and she is also refusing most care offered - becoming physically combative with staff.   Family encouraged to consider DNR/DNI status understanding evidenced based poor outcomes in similar hospitalized patients, as the cause of the arrest is likely associated with chronic/terminal disease rather than a reversible acute cardio-pulmonary event.   Family shares that main goal is to get patient home and ask how they can get her home.   We review that the only safe option to get her home is by accepting that patient is at end of life and involving hospice support at home to ensure she receives the appropriate care at home. Philosophy of hospice and type of support provided was reviewed.   Education provided on expected  trajectory at end of life and expectations during patient's final days.   Patient's family agrees to code status change to DNR and hospice support at home.   I ask [name] if she would like medical team to call patient's son [name] and she tells [place] that she will speak to him about conversation and let us know if there are any concerns. She agrees that we move forward with referral to hospice.   Questions and concerns addressed. PMT will continue to support holistically.  Length of Stay: 14  Current Medications: Scheduled Meds:   sodium chloride   Intravenous Once   feeding supplement (NEPRO CARB STEADY)  237  mL Oral TID BM   fluticasone  1 spray Each Nare Daily   mouth rinse  15 mL Mouth Rinse BID   sodium chloride flush  10-40 mL Intracatheter Q12H    Continuous Infusions:  sodium chloride      PRN Meds: sodium chloride, acetaminophen **OR** acetaminophen, antiseptic oral rinse, glycopyrrolate **OR** glycopyrrolate **OR** glycopyrrolate, haloperidol **OR** haloperidol lactate, ipratropium-albuterol, ondansetron **OR** ondansetron (ZOFRAN) IV, oxyCODONE, polyvinyl alcohol, sodium chloride flush  Physical Exam Vitals and nursing note reviewed.  Constitutional:      General: She is not in acute distress.    Comments: Does not verbally respond to me, looks at me, does not follow commands  Pulmonary:     Effort: Pulmonary effort is normal. No respiratory distress.  Musculoskeletal:     Right lower leg: Edema present.     Left lower leg: Edema present.  Skin:    General: Skin is warm and dry.  Neurological:     Mental Status: She is alert. She is disoriented.            Vital Signs: BP (!) 156/69 (BP Location: Right Arm)   Pulse 85   Temp 97.9 F (36.6 C) (Axillary)   Resp 17   Ht 5\' 4"  (1.626 m)   Wt 76.7 kg   SpO2 98%   BMI 29.02 kg/m  SpO2: SpO2: 98 % O2 Device: O2 Device: Room Air O2 Flow Rate: O2 Flow Rate (L/min): 4 L/min  Intake/output summary:  Intake/Output  Summary (Last 24 hours) at 11/23/2020 1256 Last data filed at 11/22/2020 2100 Gross per 24 hour  Intake 240 ml  Output --  Net 240 ml    LBM: Last BM Date: 11/21/20 Baseline Weight: Weight: 99 kg Most recent weight: Weight: 76.7 kg      Palliative Assessment/Data: 20%    Flowsheet Rows    Flowsheet Row Most Recent Value  Intake Tab   Referral Department Hospitalist  Unit at Time of Referral Intermediate Care Unit  Palliative Care Primary Diagnosis Nephrology  Date Notified 11/12/20  Palliative Care Type New Palliative care  Reason for referral Clarify Goals of Care  Date of Admission 11/09/20  Date first seen by Palliative Care 11/15/20  # of days Palliative referral response time 3 Day(s)  # of days IP prior to Palliative referral 3  Clinical Assessment   Psychosocial & Spiritual Assessment   Palliative Care Outcomes        Patient Active Problem List   Diagnosis Date Noted   Hospice care patient 11/23/2020   Lactic acidosis    Pressure injury of skin 11/12/2020   Syncope 11/10/2020   Hypotension 11/10/2020   Hypoglycemia 11/10/2020   Severe sepsis ([place]) 11/09/2020   Delirium 04/10/2020   Adult failure to thrive 04/10/2020   Acute bilat watershed infarction [facility]) 03/31/2020   AMS (altered mental status) 49/70/2637   Acute metabolic encephalopathy 85/88/5027   ESRD on hemodialysis ([place]) 03/09/2020   Thrombocytopenia ([place]) 03/09/2020   Dementia with behavioral disturbance 03/09/2020   COVID-19    SOB (shortness of breath)    Type 2 diabetes mellitus with hyperlipidemia ([place])    Sepsis ([place]) 02/16/2020   Acute on chronic respiratory failure ([place]) 02/16/2020   Edema of left lower leg 02/16/2020   Pneumonia due to COVID-19 virus 02/15/2020   Acute on chronic diastolic HF (heart failure) ([place]) 11/18/2019   Dyslipidemia 11/18/2019   Polypharmacy 04/13/2019   Goals of care, counseling/discussion 10/15/2017   ARF (acute renal  failure) ([place]) 09/07/2017   Chest  pain 09/05/2017   Ineffective health maintenance 06/26/2017   Medication management 06/26/2017   Retinal edema 10/07/2016   Postherpetic neuralgia 12/21/2015   Herpes zoster with complication 68/12/7515   Cellulitis of face 11/27/2015   Anemia of chronic disease 08/17/2015   History of colonic polyps    Hypertensive emergency 05/20/2015   Hypokalemia 05/20/2015   AKI (acute kidney injury) ([place])    Esophageal dysmotility 03/01/2015   Throat congestion 10/11/2014   Morbid obesity due to excess calories ([place]) 09/23/2014   Acute asthmatic bronchitis 05/20/2014   Abdominal pain, other specified site 09/13/2013   Elevated liver enzymes 09/13/2013   Cough 05/05/2013   Pain in lower limb 03/05/2013   DOE (dyspnea on exertion) 02/03/2013   Chronic respiratory failure ([place]) 12/13/2012   Stricture and stenosis of esophagus 08/31/2012   Onychomycosis 06/01/2012   Pain in joint, ankle and foot 06/01/2012   Diabetes mellitus type 2 in obese ([place]) 04/29/2012   Essential hypertension 07/19/2011   CKD (chronic kidney disease), stage III ([place]) 05/19/2011   Family history of malignant neoplasm of gastrointestinal tract 10/30/2010   Bloating 10/16/2010   Epigastric pain 10/16/2010   Foot pain 09/25/2010   Dysphagia 07/16/2010   GERD (gastroesophageal reflux disease) 07/16/2010   Microcytic anemia 07/16/2010   Obstructive sleep apnea 06/16/2009   HYPERCHOLESTEROLEMIA 02/16/2009   PERIPHERAL NEUROPATHY, FEET 09/23/2007   Gastroparesis 08/21/2007   Chronic obstructive asthma ([place]) 08/09/2006    Palliative Care Assessment & Plan   Patient Profile: Per intake H&P --> 29 year old with past medical history significant for ESRD on hemodialysis TTS, anemia, asthma, CAD, chronic diastolic heart failure, pulmonary hypertension, COPD advanced dementia, chronic ambulatory dysfunction, depression, type 2 diabetes, seizure, chronic respiratory failure on home oxygen, OSA on CPAP, CVA. Was admitted post  hemodialysis after witnessed syncopal event with suspected seizure like activity.    Palliative care was asked to get involved to discuss goals of care in the setting of multiple co-morbid conditions.  Assessment: ESRD FTT CHF COPD Advanced dementia Goals of care conversation  Recommendations/Plan: Son is HCPOA but has deferred decision making to patient's sister [name] After extensive discussion with multiple members of medical team [name] agrees to DNR and hospice support at home - would like patient to go home today if possible - TOC is working on hospice setup Signed DNR placed on chart Orders shifted to focus on comfort, add PRN oxycodone solution - avoid morphine in renal failure Agree with futility of continuing aggressive interventions and educated family extensively, sharing my recommendation for DNR and comfort care    Prognosis:  <2 weeks  Discharge Planning: Home with Hospice  Care plan was discussed with Patient's sister and the care team  60 minutes  Greater than 50%  of this time was spent counseling and coordinating care related to the above assessment and plan.  Juel Burrow, DNP, AGNP-C Palliative Medicine Team Team Phone # [phone]  Pager # [phone]  Thank you for allowing the Palliative Medicine Team to assist in the care of this patient. Please utilize secure chat with additional questions, if there is no response within 30 minutes please call the above phone number.  Palliative Medicine Team providers are available by phone from 7am to 7pm daily and can be reached through the team cell phone.  Should this patient require assistance outside of these hours, please call the patient's attending physician.

## 2020-11-23 NOTE — Discharge Summary (Signed)
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 remains in stable position with the tip in the lower right atrium. Heart is normal size. Lungs clear. No effusions or pneumothorax. No acute bony abnormality. IMPRESSION: No active disease. Electronically Signed   By: Rolm Baptise M.D.   On: 11/09/2020 18:10   EEG adult  Result Date: 11/12/2020 Lora Havens, MD     11/12/2020  4:46 PM Patient Name: METZLI POLLICK MRN: [id number] Epilepsy Attending: Lora Havens Referring Physician/Provider: Dr Derrick Ravel Date: 11/12/2020 Duration: 22.22 mins Patient history: 75yo F with syncope. EEG to evaluate for seizure Level of alertness: Awake AEDs during EEG study: None Technical aspects: This EEG study was done with scalp electrodes positioned according to the 10-20 International system of electrode placement. Electrical activity was acquired at a sampling rate of '500Hz'  and reviewed with a high frequency filter of '70Hz'  and a low frequency filter of '1Hz' . EEG data were  recorded continuously and digitally stored. Description: The posterior dominant rhythm consists of 9 Hz activity of moderate voltage (25-35 uV) seen predominantly in posterior head regions, symmetric and reactive to eye opening and eye closing. EEG showed intermittent generalized 3-'6hz'  theta-delta slowing.  Hyperventilation and photic stimulation were not performed.   ABNORMALITY - Intermittent slow, generalized IMPRESSION: This study is suggestive of mild diffuse encephalopathy, non specific etiology. No seizures or epileptiform discharges were seen throughout the recording. Lora Havens   ECHOCARDIOGRAM COMPLETE  Result Date: 11/11/2020    ECHOCARDIOGRAM REPORT   Patient Name:   ZIYA COONROD Date of Exam: 11/11/2020 Medical Rec #:  [id number]           Height:       64.0 in Accession #:    [id number]          Weight:       218.3 lb Date of Birth:  08-May-1945           BSA:          2.030 m Patient Age:    57 years            BP:           132/67 mmHg Patient Gender: F                   HR:           54 bpm. Exam Location:  Inpatient Procedure: 2D Echo, 3D Echo, Cardiac Doppler and Color Doppler Indications:    R55 Syncope  History:        Patient has prior history of Echocardiogram examinations, most  recent 03/31/2020. COPD, Signs/Symptoms:Bacteremia, Alzheimer's,                 Altered Mental Status and Chest Pain; Risk Factors:Hypertension,                 Diabetes and Dyslipidemia. ESRD.  Sonographer:    Roseanna Rainbow RDCS Referring Phys: [id number] [facility]  Sonographer Comments: Technically difficult study due to poor echo windows. Patient in mits and in slight right decubitus position. Patient could not follow directions. Patient making repetitive movements throughout exam that corrupted EKG signal. IMPRESSIONS  1. Peak LVOT velocity - 2.2 m/s, peak gradient 19 mmHg - no obstruction. Left ventricular ejection fraction, by estimation, is >75%. The left ventricle has  hyperdynamic function. The left ventricle has no regional wall motion abnormalities. There is mild left ventricular hypertrophy. Left ventricular diastolic parameters are consistent with Grade I diastolic dysfunction (impaired relaxation).  2. Right ventricular systolic function is normal. The right ventricular size is normal. There is moderately elevated pulmonary artery systolic pressure.  3. The mitral valve is normal in structure. No evidence of mitral valve regurgitation. No evidence of mitral stenosis.  4. The aortic valve is normal in structure. Aortic valve regurgitation is not visualized. No aortic stenosis is present.  5. The inferior vena cava is normal in size with greater than 50% respiratory variability, suggesting right atrial pressure of 3 mmHg. Comparison(s): No significant change from prior study. Prior images reviewed side by side. FINDINGS  Left Ventricle: Peak LVOT velocity - 2.2 m/s, peak gradient 19 mmHg - no obstruction. Left ventricular ejection fraction, by estimation, is >75%. The left ventricle has hyperdynamic function. The left ventricle has no regional wall motion abnormalities.  3D left ventricular ejection fraction analysis performed but not reported based on interpreter judgement due to suboptimal quality. The left ventricular internal cavity size was normal in size. There is mild left ventricular hypertrophy. Left ventricular diastolic parameters are consistent with Grade I diastolic dysfunction (impaired relaxation). Right Ventricle: The right ventricular size is normal. No increase in right ventricular wall thickness. Right ventricular systolic function is normal. There is moderately elevated pulmonary artery systolic pressure. The tricuspid regurgitant velocity is 3.11 m/s, and with an assumed right atrial pressure of 15 mmHg, the estimated right ventricular systolic pressure is 56.4 mmHg. Left Atrium: Left atrial size was normal in size. Right Atrium: Right atrial size was  normal in size. Pericardium: There is no evidence of pericardial effusion. Mitral Valve: The mitral valve is normal in structure. No evidence of mitral valve regurgitation. No evidence of mitral valve stenosis. Tricuspid Valve: The tricuspid valve is normal in structure. Tricuspid valve regurgitation is not demonstrated. No evidence of tricuspid stenosis. Aortic Valve: The aortic valve is normal in structure. Aortic valve regurgitation is not visualized. No aortic stenosis is present. Aortic valve peak gradient measures 3.2 mmHg. Pulmonic Valve: The pulmonic valve was normal in structure. Pulmonic valve regurgitation is not visualized. No evidence of pulmonic stenosis. Aorta: The aortic root is normal in size and structure. Venous: The inferior vena cava is normal in size with greater than 50% respiratory variability, suggesting right atrial pressure of 3 mmHg. IAS/Shunts: No atrial level shunt detected by color flow Doppler.  LEFT VENTRICLE PLAX 2D LVIDd:         2.65 cm     Diastology LVIDs:         1.60 cm     LV e' medial:    5.11 cm/s LV PW:  1.40 cm     LV E/e' medial:  10.2 LV IVS:        1.40 cm     LV e' lateral:   6.09 cm/s LVOT diam:     1.70 cm     LV E/e' lateral: 8.5 LV SV:         38 LV SV Index:   19 LVOT Area:     2.27 cm                             3D Volume EF: LV Volumes (MOD)           3D EF:        70 % LV vol d, MOD A2C: 29.5 ml LV EDV:       54 ml LV vol d, MOD A4C: 28.3 ml LV ESV:       16 ml LV vol s, MOD A2C: 6.7 ml  LV SV:        38 ml LV vol s, MOD A4C: 6.8 ml LV SV MOD A2C:     22.8 ml LV SV MOD A4C:     28.3 ml LV SV MOD BP:      22.7 ml RIGHT VENTRICLE             IVC RV S prime:     14.30 cm/s  IVC diam: 1.90 cm TAPSE (M-mode): 1.9 cm LEFT ATRIUM             Index       RIGHT ATRIUM          Index LA diam:        3.10 cm 1.53 cm/m  RA Area:     5.02 cm LA Vol (A2C):   8.4 ml  4.16 ml/m  RA Volume:   6.16 ml  3.03 ml/m LA Vol (A4C):   18.4 ml 9.06 ml/m LA Biplane Vol:  13.8 ml 6.80 ml/m  AORTIC VALVE AV Area (Vmax): 2.15 cm AV Vmax:        89.90 cm/s AV Peak Grad:   3.2 mmHg LVOT Vmax:      85.00 cm/s LVOT Vmean:     55.600 cm/s LVOT VTI:       0.166 m  AORTA Ao Root diam: 3.25 cm Ao Asc diam:  2.60 cm MITRAL VALVE               TRICUSPID VALVE MV Area (PHT): 2.60 cm    TR Peak grad:   38.7 mmHg MV Decel Time: 292 msec    TR Vmax:        311.00 cm/s MV E velocity: 52.00 cm/s MV A velocity: 85.50 cm/s  SHUNTS MV E/A ratio:  0.61        Systemic VTI:  0.17 m                            Systemic Diam: 1.70 cm Candee Furbish MD Electronically signed by Candee Furbish MD Signature Date/Time: 11/11/2020/2:57:12 PM    Final    VAS [place] LOWER EXTREMITY VENOUS (DVT)  Result Date: 11/10/2020  Lower Venous DVT Study Patient Name:  TSERING LEAMAN  Date of Exam:   11/10/2020 Medical Rec #: [id number]            Accession #:    [id number] Date of Birth: [birth year]  Patient Gender: F Patient Age:   70 years Exam Location:  [facility] Procedure:      VAS [place] LOWER EXTREMITY VENOUS (DVT) Referring Phys: Jerald Kief REGALADO --------------------------------------------------------------------------------  Indications: Positve d dimer.  Limitations: Patient positioning and body habitus. Comparison Study: 06/20/20 prior Performing Technologist: Archie Patten RVS  Examination Guidelines: A complete evaluation includes B-mode imaging, spectral Doppler, color Doppler, and power Doppler as needed of all accessible portions of each vessel. Bilateral testing is considered an integral part of a complete examination. Limited examinations for reoccurring indications may be performed as noted. The reflux portion of the exam is performed with the patient in reverse Trendelenburg.  +--------+---------------+---------+-----------+----------+--------------------+ RIGHT   CompressibilityPhasicitySpontaneityPropertiesThrombus Aging        +--------+---------------+---------+-----------+----------+--------------------+ CFV     None           No       No                   Age Indeterminate    +--------+---------------+---------+-----------+----------+--------------------+ SFJ     Full                                                              +--------+---------------+---------+-----------+----------+--------------------+ FV Prox None                                         Age Indeterminate    +--------+---------------+---------+-----------+----------+--------------------+ FV Mid  None                                         Age Indeterminate    +--------+---------------+---------+-----------+----------+--------------------+ FV      Partial        Yes      Yes                  Age Indeterminate    Distal                                                                    +--------+---------------+---------+-----------+----------+--------------------+ PFV     None                                         Age Indeterminate    +--------+---------------+---------+-----------+----------+--------------------+ POP     Partial        Yes      Yes                  Age Indeterminate    +--------+---------------+---------+-----------+----------+--------------------+ PTV     Partial                                      Age Indeterminate    +--------+---------------+---------+-----------+----------+--------------------+ PERO  Partial                                      Age Indeterminate    +--------+---------------+---------+-----------+----------+--------------------+ EIV                    Yes      Yes                  patent by color                                                           doppler              +--------+---------------+---------+-----------+----------+--------------------+   +--------+---------------+---------+-----------+----------+--------------------+  LEFT    CompressibilityPhasicitySpontaneityPropertiesThrombus Aging       +--------+---------------+---------+-----------+----------+--------------------+ CFV     None           Yes      Yes                  Age Indeterminate    +--------+---------------+---------+-----------+----------+--------------------+ SFJ     Full                                                              +--------+---------------+---------+-----------+----------+--------------------+ FV Prox Full                                                              +--------+---------------+---------+-----------+----------+--------------------+ FV Mid  Full                                                              +--------+---------------+---------+-----------+----------+--------------------+ FV      Full                                                              Distal                                                                    +--------+---------------+---------+-----------+----------+--------------------+ PFV     Full                                                              +--------+---------------+---------+-----------+----------+--------------------+  POP     Full           Yes      Yes                                       +--------+---------------+---------+-----------+----------+--------------------+ PTV     Full                                                              +--------+---------------+---------+-----------+----------+--------------------+ PERO    Full                                                              +--------+---------------+---------+-----------+----------+--------------------+ EIV                    Yes      Yes                  patent by color                                                           doppler              +--------+---------------+---------+-----------+----------+--------------------+      Summary: RIGHT: - 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 - No cystic structure found in the popliteal fossa.  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 - No cystic structure found in the popliteal fossa.  *See table(s) above for measurements and observations. Electronically signed by Deitra Mayo MD on 11/10/2020 at 3:12:48 PM.    Final         Subjective: Pt non-verbal, seen with 4 RN staff members at bedside attempting to clean up patient.  She is agitated and refusing care.  She is unable to express any symptoms or specific complaints.  Appears frightened and very upset.   Discharge Exam: Vitals:   11/23/20 0814 11/23/20 1047  BP: (!) 150/69 (!) 156/69  Pulse: 89 85  Resp: 17 17  Temp: 98 F (36.7 C) 97.9 F (36.6 C)  SpO2: 99% 98%   Vitals:   11/22/20 2321 11/23/20 0321 11/23/20 0814 11/23/20 1047  BP: 139/62 (!) 146/69 (!) 150/69 (!) 156/69  Pulse: 98 85 89 85  Resp: '16 18 17 17  ' Temp: 99 F (37.2 C) 98.7 F (37.1 C) 98 F (36.7 C) 97.9 F (36.6 C)  TempSrc: Axillary Axillary Axillary Axillary  SpO2: 100% 98% 99% 98%  Weight:      Height:        General: Pt is agitated, alert, awake, in some distress appearing scared and in pain, non-verbal but yelling out when  moved around in the bed Cardiovascular: RRR, S1/S2 +, no rubs, no gallops Respiratory: CTA bilaterally on exam limited by pt yelling Abdominal: Soft, non-distended abdomen Extremities: no edema, no cyanosis    The results of significant diagnostics from this hospitalization (including imaging, microbiology, ancillary and laboratory) are listed below for reference.     Microbiology: No results found for this or any previous visit (from the  past 240 hour(s)).   Labs: BNP (last 3 results) Recent Labs    02/15/20 2044  BNP 63.3   Basic Metabolic Panel: Recent Labs  Lab 11/17/20 1031 11/21/20 0825  NA 133* 134*  K 3.9 3.5  CL 98 98  CO2 29 29  GLUCOSE 131* 205*  BUN 8 17  CREATININE 2.67* 3.39*  CALCIUM 8.2* 7.8*  PHOS <1.0* <1.0*   Liver Function Tests: Recent Labs  Lab 11/17/20 1031 11/21/20 0825  ALBUMIN 1.9* 1.8*   No results for input(s): LIPASE, AMYLASE in the last 168 hours. No results for input(s): AMMONIA in the last 168 hours. CBC: Recent Labs  Lab 11/17/20 0354 11/18/20 0214 11/20/20 0216 11/21/20 0825  WBC 10.4 11.2* 12.5* 12.6*  HGB 8.2* 8.3* 8.3* 8.0*  HCT 24.5* 25.4* 24.9* 24.7*  MCV 75.6* 77.0* 76.6* 79.4*  PLT 159 162 184 176   Cardiac Enzymes: No results for input(s): CKTOTAL, CKMB, CKMBINDEX, TROPONINI in the last 168 hours. BNP: Invalid input(s): POCBNP CBG: Recent Labs  Lab 11/20/20 1832 11/20/20 2141 11/21/20 1116 11/21/20 1623 11/21/20 2137  GLUCAP 248* 177* 135* 219* 265*   D-Dimer No results for input(s): DDIMER in the last 72 hours. Hgb A1c No results for input(s): HGBA1C in the last 72 hours. Lipid Profile No results for input(s): CHOL, HDL, LDLCALC, TRIG, CHOLHDL, LDLDIRECT in the last 72 hours. Thyroid function studies No results for input(s): TSH, T4TOTAL, T3FREE, THYROIDAB in the last 72 hours.  Invalid input(s): FREET3 Anemia work up No results for input(s): VITAMINB12, FOLATE, FERRITIN, TIBC, IRON, RETICCTPCT in the last 72 hours. Urinalysis    Component Value Date/Time   COLORURINE AMBER (A) 03/09/2020 0645   APPEARANCEUR CLOUDY (A) 03/09/2020 0645   LABSPEC 1.020 03/09/2020 0645   PHURINE 5.0 03/09/2020 0645   GLUCOSEU NEGATIVE 03/09/2020 0645   GLUCOSEU >=1000 01/02/2009 1101   HGBUR NEGATIVE 03/09/2020 0645   HGBUR negative 03/25/2008 1022   BILIRUBINUR NEGATIVE 03/09/2020 0645   KETONESUR NEGATIVE 03/09/2020 0645   PROTEINUR 100 (A)  03/09/2020 0645   UROBILINOGEN 0.2 09/14/2013 1632   NITRITE NEGATIVE 03/09/2020 0645   LEUKOCYTESUR TRACE (A) 03/09/2020 0645   Sepsis Labs Invalid input(s): PROCALCITONIN,  WBC,  LACTICIDVEN Microbiology No results found for this or any previous visit (from the past 240 hour(s)).   Time coordinating discharge: Over 30 minutes  SIGNED:   Ezekiel Slocumb, DO [facility] 11/23/2020, 12:51 PM   If 7PM-7AM, please contact night-coverage [email or web address]

## 2020-11-23 NOTE — Ethics Note (Signed)
Ethics Consult Note  Initial contact w/ medical team 11/23/20, which was on patient's hospital day 14   Source of Consult: Livia Snellen spoke to [employer] this morning, I messaged w/ attending Dr. Arbutus Ped  Current attending physician/service: Ezekiel Slocumb, DO  Reason(s) for consult / relevant ethical question(s): Possible need to invoke futility policy, medical team seeking ethics input    Information-gathering: Discussion with source of consult Chart review 11/23/20    Narrative:  Medical facts: patient w/ multiple serious medical conditions unable to participate safely in hemodialysis, medical team recommending d/c HD and transition from Full Code to DNR/DNI Patient's Personal/Social Facts: lcame from home, there has been some confusion re: advanced directive / HCPOA who exactly is her decision-maker but this seems to have been resolved. Family meeting today, see notes.    Relevant underlying ethical principles were discussed directly with the attending physician. Other resources were discussed directly with the attending physician, if needed. Ethics committee strives to ensure that all necessary and appropriate steps are taken by the medical team such that all decisions made for this patient are ethically appropriate. Please note that Ethics does NOT offer legal counsel. Ethics offers the following recommendations:     Recommendations:  Based on chart review and discussion w/ attending, the medical team and family have come to a consensus re: goals of care and plan is for patient to go home w/ hospice. Ethics involvement is not needed at this point but we are happy to be involved as needed.         Thank you for this consult. Ethics will continue to follow this case as needed.   Dr. Arbutus Ped has my personal cell phone number and has my permission to share this number at their discretion.  Secure message on Epic is also welcome but may not receive an immediate  response.  Please reference AMION for on-call committee member if needed.    [place]

## 2020-11-23 NOTE — Progress Notes (Signed)
Patient refused CPAP HS. Has been refusing for few days.

## 2020-11-24 NOTE — Progress Notes (Signed)
Tried to call [facility] twice. No answer. Arboriculturist.

## 2020-11-24 NOTE — Progress Notes (Addendum)
[name] was informed by Hinton Dyer, RN that Corey Harold is on the way with the patient.   0144H, left the unit via stretcher. Not in distress.

## 2020-12-13 ENCOUNTER — Other Ambulatory Visit: Payer: Self-pay

## 2020-12-13 ENCOUNTER — Encounter (HOSPITAL_COMMUNITY): Payer: Self-pay | Admitting: Emergency Medicine

## 2020-12-13 ENCOUNTER — Emergency Department (HOSPITAL_COMMUNITY): Payer: Medicare HMO

## 2020-12-13 ENCOUNTER — Inpatient Hospital Stay (HOSPITAL_COMMUNITY)
Admission: EM | Admit: 2020-12-13 | Discharge: 2020-12-18 | DRG: 175 | Disposition: A | Discharge status: Home / Self Care | Payer: Medicare HMO | Source: Ambulatory Visit | Attending: Internal Medicine | Admitting: Internal Medicine

## 2020-12-13 DIAGNOSIS — Z8673 Personal history of transient ischemic attack (TIA), and cerebral infarction without residual deficits: Secondary | ICD-10-CM

## 2020-12-13 DIAGNOSIS — I2699 Other pulmonary embolism without acute cor pulmonale: Secondary | ICD-10-CM | POA: Diagnosis not present

## 2020-12-13 DIAGNOSIS — Z885 Allergy status to narcotic agent status: Secondary | ICD-10-CM

## 2020-12-13 DIAGNOSIS — F03918 Unspecified dementia, unspecified severity, with other behavioral disturbance: Secondary | ICD-10-CM | POA: Diagnosis present

## 2020-12-13 DIAGNOSIS — Z86711 Personal history of pulmonary embolism: Secondary | ICD-10-CM

## 2020-12-13 DIAGNOSIS — N2581 Secondary hyperparathyroidism of renal origin: Secondary | ICD-10-CM | POA: Diagnosis present

## 2020-12-13 DIAGNOSIS — I272 Pulmonary hypertension, unspecified: Secondary | ICD-10-CM | POA: Diagnosis present

## 2020-12-13 DIAGNOSIS — D649 Anemia, unspecified: Secondary | ICD-10-CM | POA: Diagnosis present

## 2020-12-13 DIAGNOSIS — Z79899 Other long term (current) drug therapy: Secondary | ICD-10-CM

## 2020-12-13 DIAGNOSIS — Z90710 Acquired absence of both cervix and uterus: Secondary | ICD-10-CM

## 2020-12-13 DIAGNOSIS — Z833 Family history of diabetes mellitus: Secondary | ICD-10-CM

## 2020-12-13 DIAGNOSIS — J449 Chronic obstructive pulmonary disease, unspecified: Secondary | ICD-10-CM | POA: Diagnosis present

## 2020-12-13 DIAGNOSIS — I2510 Atherosclerotic heart disease of native coronary artery without angina pectoris: Secondary | ICD-10-CM | POA: Diagnosis present

## 2020-12-13 DIAGNOSIS — M898X9 Other specified disorders of bone, unspecified site: Secondary | ICD-10-CM | POA: Diagnosis present

## 2020-12-13 DIAGNOSIS — Z7401 Bed confinement status: Secondary | ICD-10-CM

## 2020-12-13 DIAGNOSIS — R0902 Hypoxemia: Secondary | ICD-10-CM

## 2020-12-13 DIAGNOSIS — Z7189 Other specified counseling: Secondary | ICD-10-CM

## 2020-12-13 DIAGNOSIS — Z20822 Contact with and (suspected) exposure to covid-19: Secondary | ICD-10-CM | POA: Diagnosis present

## 2020-12-13 DIAGNOSIS — E11649 Type 2 diabetes mellitus with hypoglycemia without coma: Secondary | ICD-10-CM | POA: Diagnosis not present

## 2020-12-13 DIAGNOSIS — Z992 Dependence on renal dialysis: Secondary | ICD-10-CM

## 2020-12-13 DIAGNOSIS — Z9981 Dependence on supplemental oxygen: Secondary | ICD-10-CM

## 2020-12-13 DIAGNOSIS — E1122 Type 2 diabetes mellitus with diabetic chronic kidney disease: Secondary | ICD-10-CM | POA: Diagnosis present

## 2020-12-13 DIAGNOSIS — M810 Age-related osteoporosis without current pathological fracture: Secondary | ICD-10-CM | POA: Diagnosis present

## 2020-12-13 DIAGNOSIS — Z86010 Personal history of colonic polyps: Secondary | ICD-10-CM

## 2020-12-13 DIAGNOSIS — Z8249 Family history of ischemic heart disease and other diseases of the circulatory system: Secondary | ICD-10-CM

## 2020-12-13 DIAGNOSIS — Z832 Family history of diseases of the blood and blood-forming organs and certain disorders involving the immune mechanism: Secondary | ICD-10-CM

## 2020-12-13 DIAGNOSIS — I5032 Chronic diastolic (congestive) heart failure: Secondary | ICD-10-CM | POA: Diagnosis present

## 2020-12-13 DIAGNOSIS — Z87891 Personal history of nicotine dependence: Secondary | ICD-10-CM

## 2020-12-13 DIAGNOSIS — I132 Hypertensive heart and chronic kidney disease with heart failure and with stage 5 chronic kidney disease, or end stage renal disease: Secondary | ICD-10-CM | POA: Diagnosis present

## 2020-12-13 DIAGNOSIS — G4733 Obstructive sleep apnea (adult) (pediatric): Secondary | ICD-10-CM | POA: Diagnosis present

## 2020-12-13 DIAGNOSIS — Z96652 Presence of left artificial knee joint: Secondary | ICD-10-CM | POA: Diagnosis present

## 2020-12-13 DIAGNOSIS — J9621 Acute and chronic respiratory failure with hypoxia: Secondary | ICD-10-CM | POA: Diagnosis present

## 2020-12-13 DIAGNOSIS — N186 End stage renal disease: Secondary | ICD-10-CM | POA: Diagnosis present

## 2020-12-13 DIAGNOSIS — Z841 Family history of disorders of kidney and ureter: Secondary | ICD-10-CM

## 2020-12-13 DIAGNOSIS — Z888 Allergy status to other drugs, medicaments and biological substances status: Secondary | ICD-10-CM

## 2020-12-13 DIAGNOSIS — Z993 Dependence on wheelchair: Secondary | ICD-10-CM

## 2020-12-13 DIAGNOSIS — D573 Sickle-cell trait: Secondary | ICD-10-CM | POA: Diagnosis present

## 2020-12-13 LAB — BRAIN NATRIURETIC PEPTIDE: B Natriuretic Peptide: 134.9 pg/mL — ABNORMAL HIGH (ref 0.0–100.0)

## 2020-12-13 LAB — TROPONIN I (HIGH SENSITIVITY): Troponin I (High Sensitivity): 31 ng/L — ABNORMAL HIGH (ref <18)

## 2020-12-13 LAB — RESP PANEL BY RT-PCR (FLU A&B, COVID) ARPGX2
Influenza A by PCR: NEGATIVE
Influenza B by PCR: NEGATIVE
SARS Coronavirus 2 by RT PCR: NEGATIVE

## 2020-12-13 LAB — D-DIMER, QUANTITATIVE: D-Dimer, Quant: 2.43 ug/mL-FEU — ABNORMAL HIGH (ref 0.00–0.50)

## 2020-12-13 MED ORDER — HALOPERIDOL LACTATE 5 MG/ML IJ SOLN: 5.0000 mg | Freq: Once | INTRAMUSCULAR | Status: DC

## 2020-12-13 MED ORDER — LORAZEPAM 2 MG/ML IJ SOLN
1.0000 mg | Freq: Once | INTRAMUSCULAR | Status: AC
  Administered 2020-12-13: 1 mg via INTRAMUSCULAR
  Filled 2020-12-13: qty 1

## 2020-12-13 MED ORDER — IOHEXOL 350 MG/ML SOLN
80.0000 mL | Freq: Once | INTRAVENOUS | Status: AC | PRN
  Administered 2020-12-13: 80 mL via INTRAVENOUS

## 2020-12-13 NOTE — ED Notes (Signed)
Pt is combative and will not let RN do EKG. Pt keeps pushing leads and hands away

## 2020-12-13 NOTE — ED Notes (Signed)
Pt in ct 

## 2020-12-13 NOTE — ED Provider Notes (Addendum)
[facility] EMERGENCY DEPARTMENT Provider Note   CSN: [id number] Arrival date & time: 12/13/20  1338     History Chief Complaint  Patient presents with   Hypoxia    Joann Gutierrez is a 75 y.o. female.  HPI  75 year old female past medical history significant for ESRD on hemodialysis TTS, anemia, asthma, CAD, chronic diastolic heart failure, pulmonary hypertension, COPD, advanced dementia, chronic ambulator dysfunction, DM2, chronic respiratory failure on home oxygen, OSA on CPAP, CVA who presented to the emergency department with report of hypoxia at her PCP visit earlier today.  Per family, the patient is at her baseline dementia.  Level 5 caveat due to patient mental status.  Per EMS runsheet, 62 YOF seated in wheelchair in care of clinic staff. Pt AO to cognitive and physical baseline - nonverbal, and wheelchair-bound - warm, dry, and in no apparent distress. Family member with Pt advised that she seemed just like her normal self and that none of her behaviors, mannerisms, or level of communication seemed to be out of place. Pt was being seen as part of a routine follow-up, according to family member and staff. Roughly one month ago, she had been hospitalized after syncopal episode that occurred during renal dialysis. Today, the Pt had no complaint. However, provider at clinic observed that Pt's SpO2 was low on finger pulse oximeter. Hx of COPD and respiratory failure. Pt not apparently in any manner of respiratory distress. Pt was kept on the 2L clinic had placed her on. Pt's hands were cold and she fidgeted routinely, but once personnel maintained pulse oximeter in stable position, SpO2 read 100% on 2L with good waveform. LCEB. Clinic sought transport of Pt nonetheless, although no additional complaint was noted. Pt was lifted manually from wheelchair to stretcher with [facility] lift sling already underneath her. She was secured to stretcher and moved to unit,  where she remained secured for the duration of an incident-free transport. At hospital, report given to ED RN. EMS cleared the call with no complication. All findings and interventions as documented in this PCR.  Past Medical History:  Diagnosis Date   Adenomatous colon polyp    Allergy    Anemia    Asthma        CAD (coronary artery disease)    Mild very minimal coronary disease with 20% obtuse marginal stenosis   Carpal tunnel syndrome on left    Cataract    CHF (congestive heart failure) (HCC)    Chronic kidney disease (CKD), stage III (moderate) (HCC)    now stage 4   COPD (chronic obstructive pulmonary disease) ([place])    Dementia with behavioral disturbance 03/09/2020   Depression    Diabetes mellitus 1997   Type II    Diverticulosis    Dyspnea    Elevated diaphragm November 2011   Right side   Esophageal dysmotility    Esophageal stricture    ESRD on hemodialysis ([place]) 03/09/2020   Gastritis    Gastroparesis 08/21/2007   GERD (gastroesophageal reflux disease)    Hearing loss of both ears    Hernia, hiatal    Hyperlipidemia    Hypertension    Morbid obesity ([place])    OSTEOARTHRITIS 08/09/2006   Osteoporosis    Oxygen deficiency    prn    PERIPHERAL NEUROPATHY, FEET 09/23/2007   RENAL INSUFFICIENCY 02/16/2009   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   Seizures ([place])    pt thinks it has been several monthes since she had a  seisure   Shingles    Sickle cell trait ([place])    Sleep apnea    uses cpap   Stroke [facility])     Patient Active Problem List   Diagnosis Date Noted   Hospice care patient 11/23/2020   Lactic acidosis    Pressure injury of skin 11/12/2020   Syncope 11/10/2020   Hypotension 11/10/2020   Hypoglycemia 11/10/2020   Severe sepsis ([place]) 11/09/2020   Delirium 04/10/2020   Adult failure to thrive 04/10/2020   Acute bilat watershed infarction [facility]) 03/31/2020   AMS (altered mental status) 16/10/9602   Acute metabolic encephalopathy 54/09/8117    ESRD on hemodialysis ([place]) 03/09/2020   Thrombocytopenia ([place]) 03/09/2020   Dementia with behavioral disturbance 03/09/2020   COVID-19    SOB (shortness of breath)    Type 2 diabetes mellitus with hyperlipidemia ([place])    Sepsis ([place]) 02/16/2020   Acute on chronic respiratory failure ([place]) 02/16/2020   Edema of left lower leg 02/16/2020   Pneumonia due to COVID-19 virus 02/15/2020   Acute on chronic diastolic HF (heart failure) ([place]) 11/18/2019   Dyslipidemia 11/18/2019   Polypharmacy 04/13/2019   Goals of care, counseling/discussion 10/15/2017   ARF (acute renal failure) ([place]) 09/07/2017   Chest pain 09/05/2017   Ineffective health maintenance 06/26/2017   Medication management 06/26/2017   Retinal edema 10/07/2016   Postherpetic neuralgia 12/21/2015   Herpes zoster with complication 14/78/2956   Cellulitis of face 11/27/2015   Anemia of chronic disease 08/17/2015   History of colonic polyps    Hypertensive emergency 05/20/2015   Hypokalemia 05/20/2015   AKI (acute kidney injury) ([place])    Esophageal dysmotility 03/01/2015   Throat congestion 10/11/2014   Morbid obesity due to excess calories ([place]) 09/23/2014   Acute asthmatic bronchitis 05/20/2014   Abdominal pain, other specified site 09/13/2013   Elevated liver enzymes 09/13/2013   Cough 05/05/2013   Pain in lower limb 03/05/2013   DOE (dyspnea on exertion) 02/03/2013   Chronic respiratory failure ([place]) 12/13/2012   Stricture and stenosis of esophagus 08/31/2012   Onychomycosis 06/01/2012   Pain in joint, ankle and foot 06/01/2012   Diabetes mellitus type 2 in obese ([place]) 04/29/2012   Essential hypertension 07/19/2011   CKD (chronic kidney disease), stage III ([place]) 05/19/2011   Family history of malignant neoplasm of gastrointestinal tract 10/30/2010   Bloating 10/16/2010   Epigastric pain 10/16/2010   Foot pain 09/25/2010   Dysphagia 07/16/2010   GERD (gastroesophageal reflux disease) 07/16/2010   Microcytic anemia  07/16/2010   Obstructive sleep apnea 06/16/2009   HYPERCHOLESTEROLEMIA 02/16/2009   PERIPHERAL NEUROPATHY, FEET 09/23/2007   Gastroparesis 08/21/2007   Chronic obstructive asthma ([place]) 08/09/2006    Past Surgical History:  Procedure Laterality Date   ABDOMINAL HYSTERECTOMY     ARTERY BIOPSY  01/07/2011   Procedure: MINOR BIOPSY TEMPORAL ARTERY;  Surgeon: Haywood Lasso, MD;  Location: [place] [facility] [place];  Service: General;  Laterality: Left;  left temporal artery biopsy   AV FISTULA PLACEMENT Right 11/16/2018   Procedure: CREATION RIGHT BRACHIOCEPHALIC FISTULA  ARTERIOVENOUS FISTULA;  Surgeon: Angelia Mould, MD;  Location: [facility] OR;  Service: Vascular;  Laterality: Right;   bil foot surgery     BREAST LUMPECTOMY     benign   BREAST LUMPECTOMY     both breast lumps removed    CATARACT EXTRACTION Left 06/2016   Dr. Read Drivers   COLONOSCOPY     COLONOSCOPY WITH PROPOFOL N/A 07/05/2015  Procedure: COLONOSCOPY WITH PROPOFOL;  Surgeon: Manus Gunning, MD;  Location: [facility];  Service: Gastroenterology;  Laterality: N/A;   COLONOSCOPY WITH PROPOFOL N/A 08/04/2018   Procedure: COLONOSCOPY WITH PROPOFOL;  Surgeon: Yetta Flock, MD;  Location: [facility];  Service: Gastroenterology;  Laterality: N/A;   ERD  08/08/2000   ESOPHAGEAL MANOMETRY N/A 03/13/2015   Procedure: ESOPHAGEAL MANOMETRY (EM);  Surgeon: Manus Gunning, MD;  Location: [facility];  Service: Gastroenterology;  Laterality: N/A;   ESOPHAGOGASTRODUODENOSCOPY  06/25/2006   ESOPHAGOGASTRODUODENOSCOPY (EGD) WITH PROPOFOL N/A 07/05/2015   Procedure: ESOPHAGOGASTRODUODENOSCOPY (EGD) WITH PROPOFOL;  Surgeon: Manus Gunning, MD;  Location: [facility];  Service: Gastroenterology;  Laterality: N/A;   FISTULA SUPERFICIALIZATION Right 11/16/2018   Procedure: Fistula Superficialization;  Surgeon: Angelia Mould, MD;  Location: [facility] OR;  Service: Vascular;  Laterality: Right;    HERNIA REPAIR     IR FLUORO GUIDE CV LINE LEFT  11/10/2020   IR PERC TUN PERIT CATH WO PORT S&I /IMAG  02/17/2020   IR US GUIDE VASC ACCESS LEFT  11/10/2020   IR US GUIDE VASC ACCESS RIGHT  02/18/2020   LEFT AND RIGHT HEART CATHETERIZATION WITH CORONARY ANGIOGRAM N/A 03/03/2013   Procedure: LEFT AND RIGHT HEART CATHETERIZATION WITH CORONARY ANGIOGRAM;  Surgeon: Minus Breeding, MD;  Location[facility] CATH LAB;  Service: Cardiovascular;  Laterality: N/A;   REPLACEMENT TOTAL KNEE Left 1998   UPPER GASTROINTESTINAL ENDOSCOPY       OB History   No obstetric history on file.     Family History  Problem Relation Age of Onset   Liver cancer Mother        Liver Cancer   Diabetes Mother    Kidney disease Mother    Heart disease Mother        age 15's   Heart disease Father 38       MI   Heart attack Father        died of MI when pt was 5   Sickle cell anemia Father    Colon cancer Brother    Cancer Brother        Colon Cancer   Diabetes Sister    Kidney disease Sister    Heart disease Sister        age 63's   Allergies Sister    Diabetes Sister    Kidney disease Sister    Heart disease Sister        age 59's   Esophageal cancer Neg Hx    Rectal cancer Neg Hx    Stomach cancer Neg Hx    Amblyopia Neg Hx    Blindness Neg Hx    Glaucoma Neg Hx    Macular degeneration Neg Hx    Retinal detachment Neg Hx    Cataracts Neg Hx    Strabismus Neg Hx    Retinitis pigmentosa Neg Hx     Social History   Tobacco Use   Smoking status: Former    Packs/day: 0.50    Years: 10.00    Pack years: 5.00    Types: Cigarettes    Quit date: 04/18/1980    Years since quitting: 40.6   Smokeless tobacco: Never  Vaping Use   Vaping Use: Never used  Substance Use Topics   Alcohol use: No    Alcohol/week: 0.0 standard drinks   Drug use: No    Home Medications Prior to Admission medications   Medication Sig Start Date End Date Taking? Authorizing Provider  acetaminophen (TYLENOL) 325 MG  tablet Take 2 tablets (650 mg total) by mouth every 6 (six) hours as needed for mild pain (or Fever >/= 101). 11/23/20   Ezekiel Slocumb, DO  antiseptic oral rinse (BIOTENE) LIQD Apply 15 mLs topically as needed for dry mouth. 11/23/20   Ezekiel Slocumb, DO  glycopyrrolate (ROBINUL) 1 MG tablet Take 1 tablet (1 mg total) by mouth every 4 (four) hours as needed (excessive secretions). 11/23/20   Nicole Kindred A, DO  haloperidol (HALDOL) 0.5 MG tablet Take 1 tablet (0.5 mg total) by mouth every 6 (six) hours as needed for agitation. 11/23/20   Nicole Kindred A, DO  ondansetron (ZOFRAN-ODT) 4 MG disintegrating tablet Take 1 tablet (4 mg total) by mouth every 6 (six) hours as needed for nausea. 11/23/20   Ezekiel Slocumb, DO  oxyCODONE (ROXICODONE INTENSOL) 20 MG/ML concentrated solution Place 0.3 mLs (6 mg total) under the tongue every 2 (two) hours as needed for moderate pain or severe pain (or dyspnea). 11/23/20   Nicole Kindred A, DO  OXYGEN Inhale 2 L into the lungs as needed. CPAP with oxygen at bedtime    [provider]  polyvinyl alcohol (LIQUIFILM TEARS) 1.4 % ophthalmic solution Place 1 drop into both eyes 4 (four) times daily as needed for dry eyes. 11/23/20   Ezekiel Slocumb, DO  senna (SENOKOT) 8.6 MG tablet Take 2 tablets (17.2 mg total) by mouth 2 (two) times daily. May crush, mix with water and give sublingually if needed. 11/23/20   Ezekiel Slocumb, DO    Allergies    Morphine and related and Promethazine hcl  Review of Systems   Review of Systems  Unable to perform ROS: Dementia   Physical Exam Updated Vital Signs BP 104/87   Pulse (!) 105   Temp 97.6 F (36.4 C) (Oral)   Resp 13   Ht 5\' 4"  (1.626 m)   Wt 76.7 kg   SpO2 100%   BMI 29.02 kg/m   Physical Exam Vitals and nursing note reviewed.  Constitutional:      General: She is not in acute distress. HENT:     Head: Normocephalic and atraumatic.  Eyes:     Conjunctiva/sclera: Conjunctivae normal.      Pupils: Pupils are equal, round, and reactive to light.  Cardiovascular:     Rate and Rhythm: Normal rate and regular rhythm.     Pulses: Normal pulses.     Heart sounds: Normal heart sounds.  Pulmonary:     Effort: Pulmonary effort is normal. No respiratory distress.     Breath sounds: Rhonchi present.  Abdominal:     General: There is no distension.     Tenderness: There is no guarding.  Musculoskeletal:        General: No deformity or signs of injury.     Cervical back: Neck supple.  Skin:    Findings: No lesion or rash.  Neurological:     General: No focal deficit present.     Mental Status: She is alert. Mental status is at baseline.    ED Results / Procedures / Treatments   Labs (all labs ordered are listed, but only abnormal results are displayed) Labs Reviewed  D-DIMER, QUANTITATIVE - Abnormal; Notable for the following components:      Result Value   D-Dimer, Quant 2.43 (*)    All other components within normal limits  BRAIN NATRIURETIC PEPTIDE - Abnormal; Notable for the following components:  B Natriuretic Peptide 134.9 (*)    All other components within normal limits  TROPONIN I (HIGH SENSITIVITY) - Abnormal; Notable for the following components:   Troponin I (High Sensitivity) 31 (*)    All other components within normal limits  RESP PANEL BY RT-PCR (FLU A&B, COVID) ARPGX2  BASIC METABOLIC PANEL  CBC  HEPARIN LEVEL (UNFRACTIONATED)  TROPONIN I (HIGH SENSITIVITY)    EKG EKG Interpretation  Date/Time:  Wednesday December 13 2020 23:10:07 EST Ventricular Rate:  169 PR Interval:    QRS Duration: 226 QT Interval:  355 QTC Calculation: 494 R Axis:   -27 Text Interpretation: Poor quality data, interpretation may be affected Extreme tachycardia with wide complex, no further rhythm analysis attempted Artifact in lead(s) I II III aVR aVL aVF V1 V2 V3 V4 V5 V6 Unable to accurately interpret due to poor data quality Confirmed by Regan Lemming ([id number]) on  12/13/2020 11:12:36 PM  Radiology CT Angio Chest PE W and/or Wo Contrast  Result Date: 12/14/2020 CLINICAL DATA:  found to have low oxygen and placed on 2L [place]. PE suspected, low/intermediate prob, positive D-dimer EXAM: CT ANGIOGRAPHY CHEST WITH CONTRAST TECHNIQUE: Multidetector CT imaging of the chest was performed using the standard protocol during bolus administration of intravenous contrast. Multiplanar CT image reconstructions and MIPs were obtained to evaluate the vascular anatomy. CONTRAST:  80mL OMNIPAQUE IOHEXOL 350 MG/ML SOLN COMPARISON:  None. FINDINGS: Lines and tubes: Dialysis catheter with tip terminating at the inferior cavoatrial junction. Cardiovascular: Satisfactory opacification of the pulmonary arteries to the segmental level. Left lower lobe segmental pulmonary embolus. Normal heart size. No pericardial effusion. Atherosclerotic plaque. At least right main coronary artery calcification. Incidentally noted duplicated superior vena cava. Mediastinum/Nodes: No enlarged mediastinal, hilar, or axillary lymph nodes. Thyroid gland, trachea, and esophagus demonstrate no significant findings. Debris within the right mainstem bronchi. Elevated right hemidiaphragm. Lungs/Pleura: Trace consolidation with air bronchogram at the right base. No pulmonary nodule. No pulmonary mass. No pleural effusion. No pneumothorax. Upper Abdomen: No acute abnormality. Musculoskeletal: No chest wall abnormality. No suspicious lytic or blastic osseous lesions. No acute displaced fracture. Multilevel degenerative changes of the spine. Partially visualized right upper extremity with likely severe degenerative changes of the wrist. Review of the MIP images confirms the above findings. IMPRESSION: 1. Left lower lobe segmental pulmonary embolus. No findings of right heart strain or pulmonary infarction. 2. Debris within the right mainstem bronchi with trace consolidation at the right base which may be due to atelectasis in  the setting of an elevated right hemidiaphragm versus developing infection/inflammation. 3. Dialysis catheter with tip terminating at the inferior cavoatrial junction. 4.  Aortic Atherosclerosis (ICD10-I70.0). These results were called by telephone at the time of interpretation on 12/14/2020 at 12:04 am to provider Dr. Wyvonnia Dusky, who verbally acknowledged these results. Electronically Signed   By: Iven Finn M.D.   On: 12/14/2020 00:15   DG Chest Port 1 View  Result Date: 12/13/2020 CLINICAL DATA:  Hypoxia, short of breath EXAM: PORTABLE CHEST 1 VIEW COMPARISON:  11/12/2020 FINDINGS: Single frontal view of the chest demonstrates stable right internal jugular dialysis catheter. The cardiac silhouette is unremarkable. No airspace disease, effusion, or pneumothorax. Chronic elevation right hemidiaphragm. IMPRESSION: 1. No acute intrathoracic process. Electronically Signed   By: Randa Ngo M.D.   On: 12/13/2020 16:37    Procedures Procedures   Medications Ordered in ED Medications  heparin bolus via infusion 4,000 Units (has no administration in time range)  heparin ADULT infusion 100 units/mL (  25000 units/275mL) (has no administration in time range)  LORazepam (ATIVAN) injection 1 mg (1 mg Intramuscular Given 12/13/20 1745)  iohexol (OMNIPAQUE) 350 MG/ML injection 80 mL (80 mLs Intravenous Contrast Given 12/13/20 2351)    ED Course  I have reviewed the triage vital signs and the nursing notes.  Pertinent labs & imaging results that were available during my care of the patient were reviewed by me and considered in my medical decision making (see chart for details).    MDM Rules/Calculators/A&P                           75 year old female past medical history significant for ESRD on hemodialysis TTS, anemia, asthma, CAD, chronic diastolic heart failure, pulmonary hypertension, COPD, advanced dementia, chronic ambulator dysfunction, DM2, chronic respiratory failure on home oxygen, OSA on  CPAP, CVA who presented to the emergency department with report of hypoxia at her PCP visit earlier today.  Per family, the patient is at her baseline dementia.   On arrival, the patient was hypoxic, placed on 2 L O2 via nasal cannula.  Screening laboratory work-up initiated which revealed an elevated D-dimer to 2.43, mildly elevated troponin to 31, repeat collected and pending, mildly elevated BNP to 135, COVID-19 and influenza testing resulted negative, x-ray of the chest 1 view did not reveal evidence of pneumothorax or pneumonia. CTA PE imaging ordered and pending.  Patient was in combative and nursing staff was unable to obtain an EKG.  The patient does have a history reportedly of pulmonary hypertension.  Additionally, review of the patient's medical record reveals that the patient had acute pulmonary embolism of the left upper lobe on CT.  She was reportedly transitioned to Eliquis on 10/24 and had not been taking Eliquis consistently and transition back to IV heparin on 10/26.  I do not see where the patient was continued on anticoagulation.  Patient was elected to transition to comfort care and discharge home with hospice as the patient was refusing meds, labs in the setting of incurable chronic diseases including ESRD and progressive dementia.  This was in conjunction with a family meeting with the patient's sister and significant other along with nephrology and the patient's nursing director and palliative care.  I am unable to reach family members at this time to discuss further goals of care. Attempts made to contact [name] at [phone] to discuss further goals of care.  Patient appears to be at her baseline mental status and has been stable on 2 L O2 via nasal cannula which she has been on previously outpatient.  I do not see an indication for hospital admission at this time.  TOC consult placed to arrange for safe disposition and discharge.  12:04 AM Was finally able to reach Joann Gutierrez, the patient's sister.  She states that the patient had been taken off of hospice due to a turnaround.  She is no longer on hospice and has been going to dialysis.  She had previously been on heparin while in hospice but is not currently on anticoagulation.  She has not been on oxygen at home.  She presented to her PCP office today with new hypoxia and a new oxygen requirement.  Concern for new or worsening PE as the patient did not been on anticoagulation outpatient.  Patient will likely benefit from admission due to new hypoxic respiratory failure.  Received call from radiology regarding the patient's CTA.  Test result is positive for  left lower lobe segmental pulmonary embolus with no evidence of right heart strain or pulmonary infarction.  Patient was started on heparin and hospitalist medicine was consulted for admission. Informed by admitting hospitalist team that multiple patients currently in line ahead of patient. Signout given to Dr. Roxanne Mins at 2550.   Final Clinical Impression(s) / ED Diagnoses Final diagnoses:  Hypoxia  Acute pulmonary embolism, unspecified pulmonary embolism type, unspecified whether acute cor pulmonale present [facility])    Rx / DC Orders ED Discharge Orders     None        Regan Lemming, MD 12/14/20 Patrice Paradise, MD 12/14/20 [phone]

## 2020-12-13 NOTE — ED Triage Notes (Signed)
Per [facility] pt comin from [facility] for a check up and was found to have low oxygen and placed on 2L [place]. Unable to tell what room air sat. Per family patient is at her baseline. No respiratory distress noted.

## 2020-12-13 NOTE — ED Provider Notes (Signed)
Emergency Medicine Provider Triage Evaluation Note  Joann Gutierrez , a 75 y.o. female  was evaluated in triage.  Pt here for eval of hypoxia. Was seen in clinic pta and sent here for low sats.  Pt unable to provide histor  Review of Systems  Positive: hypoxia Negative: N/a  Physical Exam  BP 109/88   Pulse (!) 103   Temp 97.6 F (36.4 C) (Oral)   Resp 19   Ht 5\' 4"  (1.626 m)   Wt 76.7 kg   SpO2 100%   BMI 29.02 kg/m  Gen:   Awake, no distress   Resp:  Normal effort  MSK:   Moves extremities without difficulty   Medical Decision Making  Medically screening exam initiated at 1:54 PM.  Appropriate orders placed.  Joann Gutierrez was informed that the remainder of the evaluation will be completed by another provider, this initial triage assessment does not replace that evaluation, and the importance of remaining in the ED until their evaluation is complete.     Rodney Booze, PA-C 12/13/20 1401    Lorelle Gibbs, DO 12/13/20 1622

## 2020-12-13 NOTE — ED Notes (Signed)
IV team at bedside 

## 2020-12-14 DIAGNOSIS — E1122 Type 2 diabetes mellitus with diabetic chronic kidney disease: Secondary | ICD-10-CM | POA: Diagnosis present

## 2020-12-14 DIAGNOSIS — Z515 Encounter for palliative care: Secondary | ICD-10-CM | POA: Diagnosis not present

## 2020-12-14 DIAGNOSIS — N186 End stage renal disease: Secondary | ICD-10-CM | POA: Diagnosis present

## 2020-12-14 DIAGNOSIS — N2581 Secondary hyperparathyroidism of renal origin: Secondary | ICD-10-CM | POA: Diagnosis present

## 2020-12-14 DIAGNOSIS — F03918 Unspecified dementia, unspecified severity, with other behavioral disturbance: Secondary | ICD-10-CM | POA: Diagnosis present

## 2020-12-14 DIAGNOSIS — Z7401 Bed confinement status: Secondary | ICD-10-CM | POA: Diagnosis not present

## 2020-12-14 DIAGNOSIS — I2699 Other pulmonary embolism without acute cor pulmonale: Secondary | ICD-10-CM | POA: Diagnosis present

## 2020-12-14 DIAGNOSIS — R0902 Hypoxemia: Secondary | ICD-10-CM | POA: Diagnosis present

## 2020-12-14 DIAGNOSIS — Z8249 Family history of ischemic heart disease and other diseases of the circulatory system: Secondary | ICD-10-CM | POA: Diagnosis not present

## 2020-12-14 DIAGNOSIS — M810 Age-related osteoporosis without current pathological fracture: Secondary | ICD-10-CM | POA: Diagnosis present

## 2020-12-14 DIAGNOSIS — G4733 Obstructive sleep apnea (adult) (pediatric): Secondary | ICD-10-CM | POA: Diagnosis present

## 2020-12-14 DIAGNOSIS — Z20822 Contact with and (suspected) exposure to covid-19: Secondary | ICD-10-CM | POA: Diagnosis present

## 2020-12-14 DIAGNOSIS — D573 Sickle-cell trait: Secondary | ICD-10-CM | POA: Diagnosis present

## 2020-12-14 DIAGNOSIS — F0280 Dementia in other diseases classified elsewhere without behavioral disturbance: Secondary | ICD-10-CM | POA: Diagnosis not present

## 2020-12-14 DIAGNOSIS — J449 Chronic obstructive pulmonary disease, unspecified: Secondary | ICD-10-CM | POA: Diagnosis present

## 2020-12-14 DIAGNOSIS — Z833 Family history of diabetes mellitus: Secondary | ICD-10-CM | POA: Diagnosis not present

## 2020-12-14 DIAGNOSIS — J9621 Acute and chronic respiratory failure with hypoxia: Secondary | ICD-10-CM | POA: Diagnosis present

## 2020-12-14 DIAGNOSIS — E11649 Type 2 diabetes mellitus with hypoglycemia without coma: Secondary | ICD-10-CM | POA: Diagnosis not present

## 2020-12-14 DIAGNOSIS — I132 Hypertensive heart and chronic kidney disease with heart failure and with stage 5 chronic kidney disease, or end stage renal disease: Secondary | ICD-10-CM | POA: Diagnosis present

## 2020-12-14 DIAGNOSIS — D649 Anemia, unspecified: Secondary | ICD-10-CM | POA: Diagnosis present

## 2020-12-14 DIAGNOSIS — Z832 Family history of diseases of the blood and blood-forming organs and certain disorders involving the immune mechanism: Secondary | ICD-10-CM | POA: Diagnosis not present

## 2020-12-14 DIAGNOSIS — Z841 Family history of disorders of kidney and ureter: Secondary | ICD-10-CM | POA: Diagnosis not present

## 2020-12-14 DIAGNOSIS — I5032 Chronic diastolic (congestive) heart failure: Secondary | ICD-10-CM | POA: Diagnosis present

## 2020-12-14 DIAGNOSIS — M898X9 Other specified disorders of bone, unspecified site: Secondary | ICD-10-CM | POA: Diagnosis present

## 2020-12-14 DIAGNOSIS — I2510 Atherosclerotic heart disease of native coronary artery without angina pectoris: Secondary | ICD-10-CM | POA: Diagnosis present

## 2020-12-14 DIAGNOSIS — Z992 Dependence on renal dialysis: Secondary | ICD-10-CM | POA: Diagnosis not present

## 2020-12-14 DIAGNOSIS — I272 Pulmonary hypertension, unspecified: Secondary | ICD-10-CM | POA: Diagnosis present

## 2020-12-14 LAB — BASIC METABOLIC PANEL
Anion gap: 12 (ref 5–15)
BUN: 111 mg/dL — ABNORMAL HIGH (ref 8–23)
CO2: 26 mmol/L (ref 22–32)
Calcium: 8.6 mg/dL — ABNORMAL LOW (ref 8.9–10.3)
Chloride: 111 mmol/L (ref 98–111)
Creatinine, Ser: 16.4 mg/dL — ABNORMAL HIGH (ref 0.44–1.00)
GFR, Estimated: 2 mL/min — ABNORMAL LOW (ref >60)
Glucose, Bld: 119 mg/dL — ABNORMAL HIGH (ref 70–99)
Potassium: 6.2 mmol/L — ABNORMAL HIGH (ref 3.5–5.1)
Sodium: 149 mmol/L — ABNORMAL HIGH (ref 135–145)

## 2020-12-14 LAB — HEPARIN LEVEL (UNFRACTIONATED)
Heparin Unfractionated: >1.1 IU/mL — ABNORMAL HIGH (ref 0.30–0.70)
Heparin Unfractionated: 0.61 IU/mL (ref 0.30–0.70)

## 2020-12-14 LAB — HEPATITIS B SURFACE ANTIBODY,QUALITATIVE: Hep B S Ab: REACTIVE — AB

## 2020-12-14 LAB — HEPATITIS B SURFACE ANTIGEN: Hepatitis B Surface Ag: NONREACTIVE

## 2020-12-14 MED ORDER — LIDOCAINE-PRILOCAINE 2.5-2.5 % EX CREA: 1.0000 "application " | TOPICAL_CREAM | CUTANEOUS | Status: DC | PRN

## 2020-12-14 MED ORDER — HEPARIN (PORCINE) 25000 UT/250ML-% IV SOLN
900.0000 [IU]/h | INTRAVENOUS | Status: DC
  Filled 2020-12-14: qty 250

## 2020-12-14 MED ORDER — ONDANSETRON HCL 4 MG PO TABS: 4.0000 mg | ORAL_TABLET | Freq: Four times a day (QID) | ORAL | Status: DC | PRN

## 2020-12-14 MED ORDER — SODIUM CHLORIDE 0.9 % IV SOLN: 100.0000 mL | INTRAVENOUS | Status: DC | PRN

## 2020-12-14 MED ORDER — HALOPERIDOL 0.5 MG PO TABS
0.5000 mg | ORAL_TABLET | Freq: Four times a day (QID) | ORAL | Status: DC | PRN
  Filled 2020-12-14: qty 1

## 2020-12-14 MED ORDER — HEPARIN (PORCINE) 25000 UT/250ML-% IV SOLN
1100.0000 [IU]/h | INTRAVENOUS | Status: DC
  Administered 2020-12-14: 1100 [IU]/h via INTRAVENOUS
  Filled 2020-12-14: qty 250

## 2020-12-14 MED ORDER — ALBUMIN HUMAN 25 % IV SOLN
INTRAVENOUS | Status: AC
  Administered 2020-12-14: 25 g via INTRAVENOUS
  Filled 2020-12-14: qty 100

## 2020-12-14 MED ORDER — PENTAFLUOROPROP-TETRAFLUOROETH EX AERO: 1.0000 "application " | INHALATION_SPRAY | CUTANEOUS | Status: DC | PRN

## 2020-12-14 MED ORDER — ALBUTEROL SULFATE (2.5 MG/3ML) 0.083% IN NEBU: 2.5000 mg | INHALATION_SOLUTION | RESPIRATORY_TRACT | Status: DC | PRN

## 2020-12-14 MED ORDER — ACETAMINOPHEN 325 MG PO TABS: 650.0000 mg | ORAL_TABLET | Freq: Four times a day (QID) | ORAL | Status: DC | PRN

## 2020-12-14 MED ORDER — HEPARIN BOLUS VIA INFUSION
4000.0000 [IU] | Freq: Once | INTRAVENOUS | Status: AC
  Administered 2020-12-14: 4000 [IU] via INTRAVENOUS
  Filled 2020-12-14: qty 4000

## 2020-12-14 MED ORDER — HEPARIN SODIUM (PORCINE) 1000 UNIT/ML DIALYSIS
1000.0000 [IU] | INTRAMUSCULAR | Status: DC | PRN
  Administered 2020-12-14: 1000 [IU] via INTRAVENOUS_CENTRAL
  Filled 2020-12-14: qty 1

## 2020-12-14 MED ORDER — APIXABAN 5 MG PO TABS: 10.0000 mg | ORAL_TABLET | Freq: Two times a day (BID) | ORAL | Status: DC

## 2020-12-14 MED ORDER — ALBUMIN HUMAN 25 % IV SOLN: 25.0000 g | Freq: Once | INTRAVENOUS | Status: AC

## 2020-12-14 MED ORDER — ACETAMINOPHEN 650 MG RE SUPP: 650.0000 mg | Freq: Four times a day (QID) | RECTAL | Status: DC | PRN

## 2020-12-14 MED ORDER — LIDOCAINE HCL (PF) 1 % IJ SOLN: 5.0000 mL | INTRAMUSCULAR | Status: DC | PRN

## 2020-12-14 MED ORDER — HEPARIN (PORCINE) 25000 UT/250ML-% IV SOLN
850.0000 [IU]/h | INTRAVENOUS | Status: AC
  Administered 2020-12-14 – 2020-12-15 (×2): 900 [IU]/h via INTRAVENOUS
  Administered 2020-12-16: 800 [IU]/h via INTRAVENOUS
  Administered 2020-12-17: 17:00:00 850 [IU]/h via INTRAVENOUS
  Filled 2020-12-14 (×4): qty 250

## 2020-12-14 MED ORDER — SODIUM CHLORIDE 0.9% FLUSH
3.0000 mL | Freq: Two times a day (BID) | INTRAVENOUS | Status: DC
  Administered 2020-12-14 – 2020-12-18 (×6): 3 mL via INTRAVENOUS

## 2020-12-14 MED ORDER — APIXABAN 5 MG PO TABS: 5.0000 mg | ORAL_TABLET | Freq: Two times a day (BID) | ORAL | Status: DC

## 2020-12-14 MED ORDER — CHLORHEXIDINE GLUCONATE CLOTH 2 % EX PADS: 6.0000 | MEDICATED_PAD | Freq: Every day | CUTANEOUS | Status: DC

## 2020-12-14 MED ORDER — CHLORHEXIDINE GLUCONATE CLOTH 2 % EX PADS
6.0000 | MEDICATED_PAD | Freq: Every day | CUTANEOUS | Status: DC
  Administered 2020-12-15 – 2020-12-18 (×4): 6 via TOPICAL

## 2020-12-14 MED ORDER — ALTEPLASE 2 MG IJ SOLR: 2.0000 mg | Freq: Once | INTRAMUSCULAR | Status: DC | PRN

## 2020-12-14 MED ORDER — ONDANSETRON HCL 4 MG/2ML IJ SOLN: 4.0000 mg | Freq: Four times a day (QID) | INTRAMUSCULAR | Status: DC | PRN

## 2020-12-14 NOTE — ED Notes (Signed)
Attempted to give report to floor at 1635 nurse asked to call back in a few minutes.

## 2020-12-14 NOTE — Progress Notes (Addendum)
ANTICOAGULATION CONSULT NOTE - Initial Consult  Pharmacy Consult for heparin Indication: pulmonary embolus  Allergies  Allergen Reactions   Morphine And Related Other (See Comments)    Family request not to be given, reports pt does not wake up when given    Promethazine Hcl Other (See Comments)    REACTION: lethargy    Patient Measurements: Height: 5\' 4"  (162.6 cm) Weight: 76.7 kg (169 lb 1.5 oz) IBW/kg (Calculated) : 54.7 Heparin Dosing Weight: 70kg  Vital Signs: Temp: 97.8 F (36.6 C) (11/24 0714) Temp Source: Axillary (11/24 0714) BP: 139/85 (11/24 0800) Pulse Rate: 99 (11/24 0800)  Labs: Recent Labs    12/13/20 1602 12/14/20 0332  CREATININE  --  16.40*  TROPONINIHS 31*  --      Estimated Creatinine Clearance: 3 mL/min (A) (by C-G formula based on SCr of 16.4 mg/dL (H)).   Medical History: Past Medical History:  Diagnosis Date   Adenomatous colon polyp    Allergy    Anemia    Asthma        CAD (coronary artery disease)    Mild very minimal coronary disease with 20% obtuse marginal stenosis   Carpal tunnel syndrome on left    Cataract    CHF (congestive heart failure) (HCC)    Chronic kidney disease (CKD), stage III (moderate) (HCC)    now stage 4   COPD (chronic obstructive pulmonary disease) ([place])    Dementia with behavioral disturbance 03/09/2020   Depression    Diabetes mellitus 1997   Type II    Diverticulosis    Dyspnea    Elevated diaphragm November 2011   Right side   Esophageal dysmotility    Esophageal stricture    ESRD on hemodialysis ([place]) 03/09/2020   Gastritis    Gastroparesis 08/21/2007   GERD (gastroesophageal reflux disease)    Hearing loss of both ears    Hernia, hiatal    Hyperlipidemia    Hypertension    Morbid obesity ([place])    OSTEOARTHRITIS 08/09/2006   Osteoporosis    Oxygen deficiency    prn    PERIPHERAL NEUROPATHY, FEET 09/23/2007   RENAL INSUFFICIENCY 02/16/2009   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    Seizures ([place])    pt thinks it has been several monthes since she had a seisure   Shingles    Sickle cell trait ([place])    Sleep apnea    uses cpap   Stroke [facility])     Assessment: 75yo female with ESRD HD TTS, recently admitted 11/09/20-11/23/20 for hypoxia 2/2 acute left lower lobe PE. At that time the patient was refusing oral medications, so was switched to hospice care with DNR status. Sister thought patient was improving, so DNR status was reversed to full code. Patient readmitted with hypoxia as she has advanced dementia and is continuing refusal of oral medications. Pharmacy has been consulted for heparin dosing.   Heparin level returned supratherapeutic (>1.1) 11/24 AM following 4,000 unit bolus, with heparin running at 1,100 units/h. Will hold heparin for one-hour, and restart at a lower rate. Per RN, level was drawn from the opposite side the heparin was running before HD session was started. CBC ordered for tomorrow, now overt signs of bleeding noted.   Pharmacy also consulted for dosing of apixaban 1124, but patient continues to refuse oral medications, so heparin restarted ~1245.   Goal of Therapy:  Heparin level 0.3-0.7 units/ml Monitor platelets by anticoagulation protocol: Yes   Plan:  - hold heparin for ~  one hour   - restart heparin at 900 units/h - F/U 6h HL (1900 11/24)  - F/U daily HL, CBC  - monitor for signs and symptoms of bleeding  - F/U Wailea Hannie Shoe, PharmD PGY-1 Acute Care Resident  12/14/2020 11:05 AM

## 2020-12-14 NOTE — H&P (Signed)
History and Physical    Joann Gutierrez ERX:[id number] DOB: 1945/07/26 DOA: 12/13/2020  PCP: Andree Moro, DO  Patient coming from: PCP via EMS  I have personally briefly reviewed patient's old medical records in [place]  Chief Complaint: Hypoxia  HPI: Joann Gutierrez is a 75 y.o. female with medical history significant for advanced dementia who is nonverbal and wheelchair-bound, ESRD on TTS HD, COPD, history of PE not currently on anticoagulation, OSA who presented to the ED from [place] for evaluation of hypoxia. history supplemented by patient's sister and point of contact Joann Gutierrez.  Patient recently admitted 11/09/2020-11/23/2020 for hypoxia secondary to acute left upper lobe PE.  She was initially on IV heparin and transition to oral Eliquis however patient began to refuse oral medications.  Per discharge summary, extensive goals of care discussions with palliative care, nephrology, primary team, and family resulted in transition to DNR/DNI status and discharged to home with hospice care.  Patient's sister states that while at home patient appears to be improving and more interactive.  Family subsequently reversed her hospice and she has been undergoing dialysis since that time.  She had follow-up with her PCP morning of admission.  She was noted to have an elevated heart rate and low oxygen levels and subsequently sent to the ED for further evaluation.  ED Course:  Initial vitals showed BP 109/80, pulse 103, RR 19, temp 97.6 F, SPO2 100% on 2 L supplemental O2 via [place].  Labs showed troponin 31, D-dimer 2.43, BNP 134.9.  BMP and CBC collected and pending.  CTA chest PE study was obtained and showed left lower lobe segmental pulmonary embolus without findings of right heart strain or pulmonary infarction.  Debris within the right mainstem bronchi with trace consolidation at the right base seen which may be due to atelectasis in setting of right  hemidiaphragm elevation.  Dialysis catheter with tip terminating in the inferior cavoatrial junction noted.  Patient was started on IV heparin and the hospitalist service was consulted to admit for further evaluation and management.  Review of Systems:  Unable to obtain full review of systems due to patient's advanced dementia.  Past Medical History:  Diagnosis Date   Adenomatous colon polyp    Allergy    Anemia    Asthma        CAD (coronary artery disease)    Mild very minimal coronary disease with 20% obtuse marginal stenosis   Carpal tunnel syndrome on left    Cataract    CHF (congestive heart failure) (HCC)    Chronic kidney disease (CKD), stage III (moderate) (HCC)    now stage 4   COPD (chronic obstructive pulmonary disease) ([place])    Dementia with behavioral disturbance 03/09/2020   Depression    Diabetes mellitus 1997   Type II    Diverticulosis    Dyspnea    Elevated diaphragm November 2011   Right side   Esophageal dysmotility    Esophageal stricture    ESRD on hemodialysis ([place]) 03/09/2020   Gastritis    Gastroparesis 08/21/2007   GERD (gastroesophageal reflux disease)    Hearing loss of both ears    Hernia, hiatal    Hyperlipidemia    Hypertension    Morbid obesity ([place])    OSTEOARTHRITIS 08/09/2006   Osteoporosis    Oxygen deficiency    prn    PERIPHERAL NEUROPATHY, FEET 09/23/2007   RENAL INSUFFICIENCY 02/16/2009   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   Seizures (  [place])    pt thinks it has been several monthes since she had a seisure   Shingles    Sickle cell trait ([place])    Sleep apnea    uses cpap   Stroke [facility])     Past Surgical History:  Procedure Laterality Date   ABDOMINAL HYSTERECTOMY     ARTERY BIOPSY  01/07/2011   Procedure: MINOR BIOPSY TEMPORAL ARTERY;  Surgeon: Haywood Lasso, MD;  Location: [facility];  Service: General;  Laterality: Left;  left temporal artery biopsy   AV FISTULA PLACEMENT Right 11/16/2018    Procedure: CREATION RIGHT BRACHIOCEPHALIC FISTULA  ARTERIOVENOUS FISTULA;  Surgeon: Angelia Mould, MD;  Location[facility] OR;  Service: Vascular;  Laterality: Right;   bil foot surgery     BREAST LUMPECTOMY     benign   BREAST LUMPECTOMY     both breast lumps removed    CATARACT EXTRACTION Left 06/2016   Dr. Read Drivers   COLONOSCOPY     COLONOSCOPY WITH PROPOFOL N/A 07/05/2015   Procedure: COLONOSCOPY WITH PROPOFOL;  Surgeon: Manus Gunning, MD;  Location: [facility];  Service: Gastroenterology;  Laterality: N/A;   COLONOSCOPY WITH PROPOFOL N/A 08/04/2018   Procedure: COLONOSCOPY WITH PROPOFOL;  Surgeon: Yetta Flock, MD;  Location: [facility] ENDOSCOPY;  Service: Gastroenterology;  Laterality: N/A;   ERD  08/08/2000   ESOPHAGEAL MANOMETRY N/A 03/13/2015   Procedure: ESOPHAGEAL MANOMETRY (EM);  Surgeon: Manus Gunning, MD;  Location: [facility] ENDOSCOPY;  Service: Gastroenterology;  Laterality: N/A;   ESOPHAGOGASTRODUODENOSCOPY  06/25/2006   ESOPHAGOGASTRODUODENOSCOPY (EGD) WITH PROPOFOL N/A 07/05/2015   Procedure: ESOPHAGOGASTRODUODENOSCOPY (EGD) WITH PROPOFOL;  Surgeon: Manus Gunning, MD;  Location: [facility] ENDOSCOPY;  Service: Gastroenterology;  Laterality: N/A;   FISTULA SUPERFICIALIZATION Right 11/16/2018   Procedure: Fistula Superficialization;  Surgeon: Angelia Mould, MD;  Location: [facility] OR;  Service: Vascular;  Laterality: Right;   HERNIA REPAIR     IR FLUORO GUIDE CV LINE LEFT  11/10/2020   IR PERC TUN PERIT CATH WO PORT S&I /IMAG  02/17/2020   IR US GUIDE VASC ACCESS LEFT  11/10/2020   IR US GUIDE VASC ACCESS RIGHT  02/18/2020   LEFT AND RIGHT HEART CATHETERIZATION WITH CORONARY ANGIOGRAM N/A 03/03/2013   Procedure: LEFT AND RIGHT HEART CATHETERIZATION WITH CORONARY ANGIOGRAM;  Surgeon: Minus Breeding, MD;  Location: [facility] CATH LAB;  Service: Cardiovascular;  Laterality: N/A;   REPLACEMENT TOTAL KNEE Left 1998   UPPER GASTROINTESTINAL ENDOSCOPY      Social  History:  reports that she quit smoking about 40 years ago. She has a 5.00 pack-year smoking history. She has never used smokeless tobacco. She reports that she does not drink alcohol and does not use drugs.  Allergies  Allergen Reactions   Morphine And Related Other (See Comments)    Family request not to be given, reports pt does not wake up when given    Promethazine Hcl Other (See Comments)    REACTION: lethargy    Family History  Problem Relation Age of Onset   Liver cancer Mother        Liver Cancer   Diabetes Mother    Kidney disease Mother    Heart disease Mother        age 50's   Heart disease Father 87       MI   Heart attack Father        died of MI when pt was 5   Sickle cell anemia  Father    Colon cancer Brother    Cancer Brother        Colon Cancer   Diabetes Sister    Kidney disease Sister    Heart disease Sister        age 26's   Allergies Sister    Diabetes Sister    Kidney disease Sister    Heart disease Sister        age 2's   Esophageal cancer Neg Hx    Rectal cancer Neg Hx    Stomach cancer Neg Hx    Amblyopia Neg Hx    Blindness Neg Hx    Glaucoma Neg Hx    Macular degeneration Neg Hx    Retinal detachment Neg Hx    Cataracts Neg Hx    Strabismus Neg Hx    Retinitis pigmentosa Neg Hx      Prior to Admission medications   Medication Sig Start Date End Date Taking? Authorizing Provider  acetaminophen (TYLENOL) 325 MG tablet Take 2 tablets (650 mg total) by mouth every 6 (six) hours as needed for mild pain (or Fever >/= 101). 11/23/20   Ezekiel Slocumb, DO  antiseptic oral rinse (BIOTENE) LIQD Apply 15 mLs topically as needed for dry mouth. 11/23/20   Ezekiel Slocumb, DO  glycopyrrolate (ROBINUL) 1 MG tablet Take 1 tablet (1 mg total) by mouth every 4 (four) hours as needed (excessive secretions). 11/23/20   Nicole Kindred A, DO  haloperidol (HALDOL) 0.5 MG tablet Take 1 tablet (0.5 mg total) by mouth every 6 (six) hours as needed for  agitation. 11/23/20   Nicole Kindred A, DO  ondansetron (ZOFRAN-ODT) 4 MG disintegrating tablet Take 1 tablet (4 mg total) by mouth every 6 (six) hours as needed for nausea. 11/23/20   Ezekiel Slocumb, DO  oxyCODONE (ROXICODONE INTENSOL) 20 MG/ML concentrated solution Place 0.3 mLs (6 mg total) under the tongue every 2 (two) hours as needed for moderate pain or severe pain (or dyspnea). 11/23/20   Nicole Kindred A, DO  OXYGEN Inhale 2 L into the lungs as needed. CPAP with oxygen at bedtime    [provider]  polyvinyl alcohol (LIQUIFILM TEARS) 1.4 % ophthalmic solution Place 1 drop into both eyes 4 (four) times daily as needed for dry eyes. 11/23/20   Ezekiel Slocumb, DO  senna (SENOKOT) 8.6 MG tablet Take 2 tablets (17.2 mg total) by mouth 2 (two) times daily. May crush, mix with water and give sublingually if needed. 11/23/20   Ezekiel Slocumb, DO    Physical Exam: Vitals:   12/14/20 0100 12/14/20 0120 12/14/20 0140 12/14/20 0145  BP: 95/66 91/65 101/88   Pulse: 92 95 100 99  Resp: 12 13 (!) 21 17  Temp:      TempSrc:      SpO2: 100% 100% (!) 87% 100%  Weight:      Height:       Exam limited due to patient's advanced dementia. Constitutional: Chronically ill-appearing elderly woman resting in bed, not interactive Eyes: PERRL, lids and conjunctivae normal ENMT: Mucous membranes are dry. Posterior pharynx clear of any exudate or lesions. Neck: normal, supple, no masses. Respiratory: clear to auscultation anteriorly.  Normal respiratory effort. No accessory muscle use.  Cardiovascular: Regular rate and rhythm, no murmurs / rubs / gallops. No extremity edema.  HD cath in place right upper chest. Abdomen: no tenderness, no masses palpated. No hepatosplenomegaly. Musculoskeletal: no clubbing / cyanosis. No joint deformity upper and  lower extremities.  Skin: no rashes, lesions, ulcers. No induration Neurologic: Sensation appears intact.  Strength difficult to assess as she does  not follow commands. Psychiatric: Awake but nonverbal, not interactive, not following commands.  Labs on Admission: I have personally reviewed following labs and imaging studies  CBC: No results for input(s): WBC, NEUTROABS, HGB, HCT, MCV, PLT in the last 168 hours. Basic Metabolic Panel: No results for input(s): NA, K, CL, CO2, GLUCOSE, BUN, CREATININE, CALCIUM, MG, PHOS in the last 168 hours. GFR: CrCl cannot be calculated (Patient's most recent lab result is older than the maximum 21 days allowed.). Liver Function Tests: No results for input(s): AST, ALT, ALKPHOS, BILITOT, PROT, ALBUMIN in the last 168 hours. No results for input(s): LIPASE, AMYLASE in the last 168 hours. No results for input(s): AMMONIA in the last 168 hours. Coagulation Profile: No results for input(s): INR, PROTIME in the last 168 hours. Cardiac Enzymes: No results for input(s): CKTOTAL, CKMB, CKMBINDEX, TROPONINI in the last 168 hours. BNP (last 3 results) No results for input(s): PROBNP in the last 8760 hours. HbA1C: No results for input(s): HGBA1C in the last 72 hours. CBG: No results for input(s): GLUCAP in the last 168 hours. Lipid Profile: No results for input(s): CHOL, HDL, LDLCALC, TRIG, CHOLHDL, LDLDIRECT in the last 72 hours. Thyroid Function Tests: No results for input(s): TSH, T4TOTAL, FREET4, T3FREE, THYROIDAB in the last 72 hours. Anemia Panel: No results for input(s): VITAMINB12, FOLATE, FERRITIN, TIBC, IRON, RETICCTPCT in the last 72 hours. Urine analysis:    Component Value Date/Time   COLORURINE AMBER (A) 03/09/2020 0645   APPEARANCEUR CLOUDY (A) 03/09/2020 0645   LABSPEC 1.020 03/09/2020 0645   PHURINE 5.0 03/09/2020 0645   GLUCOSEU NEGATIVE 03/09/2020 0645   GLUCOSEU >=1000 01/02/2009 1101   HGBUR NEGATIVE 03/09/2020 0645   HGBUR negative 03/25/2008 1022   BILIRUBINUR NEGATIVE 03/09/2020 0645   KETONESUR NEGATIVE 03/09/2020 0645   PROTEINUR 100 (A) 03/09/2020 0645   UROBILINOGEN  0.2 09/14/2013 1632   NITRITE NEGATIVE 03/09/2020 0645   LEUKOCYTESUR TRACE (A) 03/09/2020 0645    Radiological Exams on Admission: CT Angio Chest PE W and/or Wo Contrast  Result Date: 12/14/2020 CLINICAL DATA:  found to have low oxygen and placed on 2L [place]. PE suspected, low/intermediate prob, positive D-dimer EXAM: CT ANGIOGRAPHY CHEST WITH CONTRAST TECHNIQUE: Multidetector CT imaging of the chest was performed using the standard protocol during bolus administration of intravenous contrast. Multiplanar CT image reconstructions and MIPs were obtained to evaluate the vascular anatomy. CONTRAST:  50mL OMNIPAQUE IOHEXOL 350 MG/ML SOLN COMPARISON:  None. FINDINGS: Lines and tubes: Dialysis catheter with tip terminating at the inferior cavoatrial junction. Cardiovascular: Satisfactory opacification of the pulmonary arteries to the segmental level. Left lower lobe segmental pulmonary embolus. Normal heart size. No pericardial effusion. Atherosclerotic plaque. At least right main coronary artery calcification. Incidentally noted duplicated superior vena cava. Mediastinum/Nodes: No enlarged mediastinal, hilar, or axillary lymph nodes. Thyroid gland, trachea, and esophagus demonstrate no significant findings. Debris within the right mainstem bronchi. Elevated right hemidiaphragm. Lungs/Pleura: Trace consolidation with air bronchogram at the right base. No pulmonary nodule. No pulmonary mass. No pleural effusion. No pneumothorax. Upper Abdomen: No acute abnormality. Musculoskeletal: No chest wall abnormality. No suspicious lytic or blastic osseous lesions. No acute displaced fracture. Multilevel degenerative changes of the spine. Partially visualized right upper extremity with likely severe degenerative changes of the wrist. Review of the MIP images confirms the above findings. IMPRESSION: 1. Left lower lobe segmental pulmonary embolus.  No findings of right heart strain or pulmonary infarction. 2. Debris within the  right mainstem bronchi with trace consolidation at the right base which may be due to atelectasis in the setting of an elevated right hemidiaphragm versus developing infection/inflammation. 3. Dialysis catheter with tip terminating at the inferior cavoatrial junction. 4.  Aortic Atherosclerosis (ICD10-I70.0). These results were called by telephone at the time of interpretation on 12/14/2020 at 12:04 am to provider Dr. Wyvonnia Dusky, who verbally acknowledged these results. Electronically Signed   By: Iven Finn M.D.   On: 12/14/2020 00:15   DG Chest Port 1 View  Result Date: 12/13/2020 CLINICAL DATA:  Hypoxia, short of breath EXAM: PORTABLE CHEST 1 VIEW COMPARISON:  11/12/2020 FINDINGS: Single frontal view of the chest demonstrates stable right internal jugular dialysis catheter. The cardiac silhouette is unremarkable. No airspace disease, effusion, or pneumothorax. Chronic elevation right hemidiaphragm. IMPRESSION: 1. No acute intrathoracic process. Electronically Signed   By: Randa Ngo M.D.   On: 12/13/2020 16:37    EKG: Personally reviewed. Poor EKG due to motion artifact.  Previous EKG showed sinus tachycardia.  Assessment/Plan Principal Problem:   Acute pulmonary embolism without acute cor pulmonale (HCC) Active Problems:   Obstructive sleep apnea   COPD (chronic obstructive pulmonary disease) (HCC)   ESRD on hemodialysis (HCC)   Dementia with behavioral disturbance   Joann Gutierrez is a 75 y.o. female with medical history significant for advanced dementia who is nonverbal and wheelchair-bound, ESRD on TTS HD, COPD, history of PE not currently on anticoagulation, OSA who is admitted with acute PE.  Acute pulmonary embolus: Left lower lobe segmental PE seen on CTA chest without findings of right heart strain or pulmonary infarction. -Continue IV heparin -Transition to oral Eliquis as able  Hypoxia: Patient is intermittently hypoxic while in the ED requiring 2 L supplemental O2  via [place].  ESRD on TTS HD: Per sister, patient missed last HD session.  Will need nephrology consult for continued HD while in hospital.  Goals of care: Had a long discussion with patient's sister over the phone regarding patient's chronic incurable medical conditions and that she would be unlikely to benefit from any aggressive intervention in the setting of cardiac failure or CODE BLUE event and that these would likely cause more suffering and worsening quality of life.  Sister states that they have revoked hospice care and reversed CODE STATUS to full code.  Would benefit from further palliative care discussions.  COPD: Continue albuterol as needed.  OSA: CPAP nightly.  Advanced dementia with behavioral disturbance: Appears to be at baseline nonverbal status.  Continue Haldol as needed and delirium precautions.  DVT prophylaxis: IV heparin Code Status: Full code, confirmed with patient's sister by phone at time of admission Family Communication: Discussed with patient's sister by phone Disposition Plan: From home, dispo pending clinical progress Consults called: None Level of care: Telemetry Medical Admission status:  Status is: Observation  The patient remains OBS appropriate and will d/c before 2 midnights.   Zada Finders MD [facility]  If 7PM-7AM, please contact night-coverage [email or web address]  12/14/2020, 1:55 AM

## 2020-12-14 NOTE — Progress Notes (Signed)
ANTICOAGULATION CONSULT NOTE - Initial Consult  Pharmacy Consult for heparin Indication: pulmonary embolus  Allergies  Allergen Reactions   Morphine And Related Other (See Comments)    Family request not to be given, reports pt does not wake up when given    Promethazine Hcl Other (See Comments)    REACTION: lethargy    Patient Measurements: Height: 5\' 4"  (162.6 cm) Weight: 76.7 kg (169 lb 1.5 oz) IBW/kg (Calculated) : 54.7 Heparin Dosing Weight: 70kg  Vital Signs: Temp: 97.6 F (36.4 C) (11/23 1339) Temp Source: Oral (11/23 1339) BP: 117/97 (11/24 0000) Pulse Rate: 113 (11/24 0000)  Labs: Recent Labs    12/13/20 1602  TROPONINIHS 31*    CrCl cannot be calculated (Patient's most recent lab result is older than the maximum 21 days allowed.).   Medical History: Past Medical History:  Diagnosis Date   Adenomatous colon polyp    Allergy    Anemia    Asthma        CAD (coronary artery disease)    Mild very minimal coronary disease with 20% obtuse marginal stenosis   Carpal tunnel syndrome on left    Cataract    CHF (congestive heart failure) (HCC)    Chronic kidney disease (CKD), stage III (moderate) (HCC)    now stage 4   COPD (chronic obstructive pulmonary disease) ([place])    Dementia with behavioral disturbance 03/09/2020   Depression    Diabetes mellitus 1997   Type II    Diverticulosis    Dyspnea    Elevated diaphragm November 2011   Right side   Esophageal dysmotility    Esophageal stricture    ESRD on hemodialysis ([place]) 03/09/2020   Gastritis    Gastroparesis 08/21/2007   GERD (gastroesophageal reflux disease)    Hearing loss of both ears    Hernia, hiatal    Hyperlipidemia    Hypertension    Morbid obesity ([place])    OSTEOARTHRITIS 08/09/2006   Osteoporosis    Oxygen deficiency    prn    PERIPHERAL NEUROPATHY, FEET 09/23/2007   RENAL INSUFFICIENCY 02/16/2009   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   Seizures ([place])    pt thinks it has been  several monthes since she had a seisure   Shingles    Sickle cell trait ([place])    Sleep apnea    uses cpap   Stroke [facility])     Assessment: 75yo female was sent to ED by outpt clinic for hypoxia, found to have PE, to begin heparin.  Goal of Therapy:  Heparin level 0.3-0.7 units/ml Monitor platelets by anticoagulation protocol: Yes   Plan:  Heparin 4000 units IV bolus x1 followed by infusion at 1100 units/hr and monitor heparin levels and CBC.  Wynona Neat, PharmD, BCPS  12/14/2020,12:16 AM

## 2020-12-14 NOTE — Progress Notes (Signed)
PROGRESS NOTE  Joann Gutierrez  DOB: [birth year]  PCP: Andree Moro, DO GHW:[id number]  DOA: 12/13/2020  LOS: 0 days  Hospital Day: 2  Chief Complaint  Patient presents with   Hypoxia    Brief narrative: Joann Gutierrez is a 75 y.o. female with PMH significant for advanced dementia who is nonverbal and wheelchair-bound, ESRD on TTS HD, COPD, OSA, history of PE not currently on anticoagulation. Patient was brought to the ED on 7/23 from [facility] for evaluation of hypoxia.  Patient was recently admitted 11/09/2020-11/23/2020 for hypoxia secondary to acute left upper lobe PE.  She was initially on IV heparin and transition to oral Eliquis however patient began to refuse oral medications. Per discharge summary, extensive goals of care discussions with palliative care, nephrology, primary team, and family resulted in transition to DNR/DNI status and patient has discharged to home with hospice care.  Patient's sister states that while at home patient appeared to be improving and more interactive. Family subsequently reversed her hospice and restarted dialysis.  On 11/23, she went for follow-up with her PCP.  She was noted to have tachycardia and low oxygen level and hence sent to the ED for further evaluation.  In the ED, she was afebrile, heart rate 103, blood pressure 109/80, oxygen saturation 100% on 2 L oxygen by nasal cannula. Labs showed troponin 31, D-dimer 2.43, BNP 134.9.  CT angio chest showed left lower lobe segmental pulmonary embolus without findings of right heart strain or pulmonary infarction.  Debris within the right mainstem bronchi with trace consolidation at the right base seen which may be due to atelectasis in setting of right hemidiaphragm elevation.  Dialysis catheter with tip terminating in the inferior cavoatrial junction noted.   Patient was started on IV heparin. She was kept on observation in hospitalist service for further evaluation  management  Subjective: Patient was seen and examined this morning.  Elderly African-American female.  In ED stretcher.  No family at bedside.  Patient was sleeping at the time of my evaluation.  Opens eyes on verbal command but unable to have any interaction. Chart reviewed Hemodynamically stable.  On 2 L oxygen nasal cannula  Assessment/Plan: Acute pulmonary embolism Recent history of pulm embolism -refused treatment at the time -Left lower lobe segmental PE seen on CTA chest without findings of right heart strain or pulmonary infarction.  Her last hospitalization was for left upper lobe pulm embolism and patient at that time chose not to be anticoagulated. -It seems patient and family has reversed the CODE STATUS and aggressiveness of care. -Currently patient is on IV heparin.  Transition to Eliquis this morning.   Acute respiratory failure with hypoxia -O2 sat down to 87% on room air.  Currently requiring 2 to 3 L management.  ESRD HD TTS -Nephrology consulted -Noted a plan for dialysis today.   Goals of care: Admitting MD had a long discussion with patient's sister over the phone regarding patient's chronic incurable medical conditions and that she would be unlikely to benefit from any aggressive intervention in the setting of cardiac failure or CODE BLUE event and that these would likely cause more suffering and worsening quality of life.  Per note, sister stated that they have revoked hospice care and reversed CODE STATUS to full code.  Would benefit from further palliative care discussions going forward.   COPD: Continue albuterol as needed.   OSA: CPAP nightly.   Advanced dementia with behavioral disturbance: Appears to be at baseline nonverbal  status.  Continue Haldol as needed and delirium precautions.  Mobility: Bedbound  Living condition: [employer] Goals of care:   Code Status: Full Code  Nutritional status: Body mass index is 29.02 kg/m.     Diet:  Diet  Order             DIET DYS 3 Room service appropriate? Yes; Fluid consistency: Thin  Diet effective now                  DVT prophylaxis: Heparin drip.  Switch to Eliquis this morning   Antimicrobials: None Fluid: None Consultants: Nephrology Family Communication: None at bedside  Status is: Observation  Remains inpatient appropriate because: Pending dialysis  Dispo: The patient is from: [employer]              Anticipated d/c is to: Back to facility              Patient currently is not medically stable to d/c.   Difficult to place patient No     Infusions:   sodium chloride     sodium chloride     heparin 1,100 Units/hr (12/14/20 0715)    Scheduled Meds:  Chlorhexidine Gluconate Cloth  6 each Topical Q0600   Chlorhexidine Gluconate Cloth  6 each Topical Q0600   sodium chloride flush  3 mL Intravenous Q12H    PRN meds: sodium chloride, sodium chloride, acetaminophen **OR** acetaminophen, albuterol, alteplase, haloperidol, heparin, lidocaine (PF), lidocaine-prilocaine, ondansetron **OR** ondansetron (ZOFRAN) IV, pentafluoroprop-tetrafluoroeth   Antimicrobials: Anti-infectives (From admission, onward)    None       Objective: Vitals:   12/14/20 1019 12/14/20 1030  BP: (!) 77/28 (!) 75/45  Pulse: (!) 142 (!) 110  Resp: 16 16  Temp:    SpO2:      Intake/Output Summary (Last 24 hours) at 12/14/2020 1044 Last data filed at 12/14/2020 0818 Gross per 24 hour  Intake 80 ml  Output --  Net 80 ml   Filed Weights   12/13/20 1347  Weight: 76.7 kg   Weight change:  Body mass index is 29.02 kg/m.   Physical Exam: General exam: Elderly African-American female with dementia Skin: No rashes, lesions or ulcers. HEENT: Atraumatic, normocephalic, no obvious bleeding Lungs: Clear to auscultation bilaterally CVS: Regular rate and rhythm, no murmur GI/Abd soft, nontender, nondistended, bowel sound present CNS: Alert, awake, nonverbal at  baseline Psychiatry: Sad affect Extremities: Trace bilateral pedal edema  Data Review: I have personally reviewed the laboratory data and studies available.  F/u labs ordered Unresulted Labs (From admission, onward)     Start     Ordered   12/15/20 0831  Renal function panel  Once,   R        12/14/20 0924   12/15/20 0831  CBC  Once,   R        12/14/20 0924   12/15/20 0500  Heparin level (unfractionated)  Daily,   R     See Hyperspace for full Linked Orders Report.   12/14/20 0020   12/15/20 0500  CBC  Daily,   R     See Hyperspace for full Linked Orders Report.   12/14/20 0020   12/14/20 0925  Hepatitis B surface antigen  (New Admission Hemo Labs (Hepatitis B))  Once,   R        12/14/20 0924   12/14/20 0925  Hepatitis B surface antibody  (New Admission Hemo Labs (Hepatitis B))  Once,  R        12/14/20 0924   12/14/20 0925  Hepatitis B surface antibody,quantitative  (New Admission Hemo Labs (Hepatitis B))  Once,   R        12/14/20 8138   12/13/20 8719  Basic metabolic panel  (ED Chest Pain)  Once,   STAT        12/13/20 1401   12/13/20 1402  CBC  (ED Chest Pain)  Once,   STAT        12/13/20 1401            Signed, Terrilee Croak, MD [facility] 12/14/2020

## 2020-12-14 NOTE — ED Notes (Signed)
Attempted to contact family for consent for dialysis no answer

## 2020-12-14 NOTE — Progress Notes (Signed)
New admission to room 5W06. Patient is non verbal, follows with her eyes but no response with questions. Has mittens to BL hands and are contracted. Fistula palpated to right upper arm and PERM cath noted to right chest. Two PIVs  noted to right upper arm and right AC. Heparin gtt at 99ml/hr infusing. Patient has multiple abrasions and scratch wounds to abdomen and under BL breast. Stage two pressure ulcer noted to buttock. Edema noted to right arm and BL lower extremities. Patient was given a bath. Dressing applied to ulcer, VS checked and cardiac monitor applied. Oxygen at 2L [place]. Bed kept in low position and locked with bed alarm activated. Call bell in reach. Will continue to monitor.

## 2020-12-14 NOTE — ED Notes (Signed)
Pt beginning to pull at lines and cords - pt somewhat redirectable - mittens to be ordered for safety

## 2020-12-14 NOTE — ED Notes (Signed)
Heparin not documented as stopped at 1304 was not running when back from dialysis. Unknown total amount admin.

## 2020-12-14 NOTE — ED Notes (Signed)
Hervey Ard PA Hemodialysis, was able to reach sister [name] and obtain consent. Orders to be updated stating consent was obtained. Per PA RN to [facility] forward with coordinating hemodialysis.

## 2020-12-14 NOTE — Progress Notes (Signed)
Patient was seen on dialysis and the procedure was supervised.  BFR 400  Via TDC BP is  81/48.   Patient is not tolerating HD well, she is hypotensive and tachycardic which is limiting UF but will be able to treat the K and the BUN.    This is an issue as patient has not been tolerating op HD well either-  family is not reasonable at her prognosis  [name] 12/14/2020

## 2020-12-14 NOTE — ED Notes (Signed)
Hard to get accurate BP on pt due to right arm restriction + IV placement + contracture of arm - pt arm held and BP obtained

## 2020-12-14 NOTE — Progress Notes (Signed)
ANTICOAGULATION CONSULT NOTE - Initial Consult  Pharmacy Consult for heparin Indication: pulmonary embolus  Allergies  Allergen Reactions   Morphine And Related Other (See Comments)    Family request not to be given, reports pt does not wake up when given    Gabapentin Other (See Comments)    Pt is hard to wake up when this is given per sister    Oxycodone Other (See Comments)    Pt is hard to wake up when this is given per sister   Promethazine Hcl Other (See Comments)    lethargy    Patient Measurements: Height: 5\' 4"  (162.6 cm) Weight:  (Unable to check stretcher wt) IBW/kg (Calculated) : 54.7 Heparin Dosing Weight: 70kg  Vital Signs: Temp: 97.8 F (36.6 C) (11/24 1732) Temp Source: Axillary (11/24 1732) BP: 179/126 (11/24 1732) Pulse Rate: 108 (11/24 1732)  Labs: Recent Labs    12/13/20 1602 12/14/20 0332 12/14/20 0830 12/14/20 1909  HEPARINUNFRC  --   --  >1.10* 0.61  CREATININE  --  16.40*  --   --   TROPONINIHS 31*  --   --   --      Estimated Creatinine Clearance: 3 mL/min (A) (by C-G formula based on SCr of 16.4 mg/dL (H)).   Medical History: Past Medical History:  Diagnosis Date   Adenomatous colon polyp    Allergy    Anemia    Asthma        CAD (coronary artery disease)    Mild very minimal coronary disease with 20% obtuse marginal stenosis   Carpal tunnel syndrome on left    Cataract    CHF (congestive heart failure) (HCC)    Chronic kidney disease (CKD), stage III (moderate) (HCC)    now stage 4   COPD (chronic obstructive pulmonary disease) ([place])    Dementia with behavioral disturbance 03/09/2020   Depression    Diabetes mellitus 1997   Type II    Diverticulosis    Dyspnea    Elevated diaphragm November 2011   Right side   Esophageal dysmotility    Esophageal stricture    ESRD on hemodialysis ([place]) 03/09/2020   Gastritis    Gastroparesis 08/21/2007   GERD (gastroesophageal reflux disease)    Hearing loss of both ears    Hernia,  hiatal    Hyperlipidemia    Hypertension    Morbid obesity ([place])    OSTEOARTHRITIS 08/09/2006   Osteoporosis    Oxygen deficiency    prn    PERIPHERAL NEUROPATHY, FEET 09/23/2007   RENAL INSUFFICIENCY 02/16/2009   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 At that time the patient was refusing oral medications, so was switched to hospice care with DNR status. Sister thought patient was improving, so DNR status was reversed to full code. Patient readmitted with hypoxia as she has advanced dementia and is continuing refusal of oral medications. Pharmacy has been consulted for heparin dosing.   Heparin level therapeutic at 0.61 on 900 units/hr.   Goal of Therapy:  Heparin level 0.3-0.7 units/ml Monitor platelets by anticoagulation protocol: Yes   Plan:  Continue heparin rate at 900  units/hr Confirmatory heparin level with AM labs Monitor CBC, heparin level, and s/sx bleeding daily  Joseph Art, Pharm.D. PGY-1 Pharmacy Resident [phone] 12/14/2020 8:42 PM

## 2020-12-14 NOTE — ED Provider Notes (Signed)
Care assumed from Dr. Armandina Gemma, patient with new oxygen requirement, mildly elevated BNP pending CT angiogram of the chest.  Will need hospital admission once CT angiogram report is back.  CT angiogram does show pulmonary embolism.  She is started on heparin.  Case is discussed with Dr. Posey Pronto of [facility], who agrees to admit the patient.   Delora Fuel, MD 53/64/68 [phone]

## 2020-12-14 NOTE — Plan of Care (Signed)

## 2020-12-14 NOTE — ED Notes (Signed)
Report given to [facility] RN. Informed unable to obtain consent from pt as she has dementia and is nonverbal. RN unable to contact family for consent at this time. MD Dahal B paged regarding pending consent for dialysis. Pending response

## 2020-12-14 NOTE — ED Notes (Signed)
MD Patel at bedside.

## 2020-12-14 NOTE — Consult Note (Addendum)
[facility] Renal Consultation Note    Indication for Consultation:  Management of ESRD/hemodialysis, anemia, hypertension/volume, and secondary hyperparathyroidism.  HPI: Joann Gutierrez is a 75 y.o. female with PMH including ESRD on dialysis, COPD, CAD, demential, HTN, and T2DM, who presented to the ED from [place] with hypoxia.  Patient was recently admitted from 11/09/2020 to 11/23/2020 her hypoxia secondary to acute left upper lobe PE.  Unfortunately she was refusing oral medications and after extensive goals of care discussions the family decided to transition to home hospice.  However her sister reported she appeared to be improving at home and reversed her hospice and made her a full code.  Patient was supposed to restart dialysis at [facility] but it appears she only attended on 12/09/2020 for 1 hour and has not had any dialysis since then.  Outpatient dialysis not open today to obtain any more information about missed HD.  ED course notable for O2 saturation 100% on 2 L, BP stable, CTA chest with left lower lobe segmental pulmonary embolus.  Patient was started on IV heparin.  Labs notable for NA 149, K4.2, BUN 111, creatinine 16.4, calcium 8.6, CBC is pending.  Nephrology was consulted for management of ESRD.  Patient seen in the ER.  She is sleeping, awakens to voice and moves all limbs but does not open eyes or answer any review of system questions.  Addendum: I was able to speak with patient's sister, [name], who reports patient had a short dialysis on 12/09/2020 due to low blood pressure.  Discussed risk versus benefits of dialysis.  Discussed she did not tolerate dialysis very well last admission due to agitation and that may happen again this admission.  Patient sister verbalizes understanding and wishes to attempt dialysis again today, provides consent for hemodialysis.   Past Medical History:  Diagnosis Date   Adenomatous colon  polyp    Allergy    Anemia    Asthma        CAD (coronary artery disease)    Mild very minimal coronary disease with 20% obtuse marginal stenosis   Carpal tunnel syndrome on left    Cataract    CHF (congestive heart failure) (HCC)    Chronic kidney disease (CKD), stage III (moderate) (HCC)    now stage 4   COPD (chronic obstructive pulmonary disease) ([place])    Dementia with behavioral disturbance 03/09/2020   Depression    Diabetes mellitus 1997   Type II    Diverticulosis    Dyspnea    Elevated diaphragm November 2011   Right side   Esophageal dysmotility    Esophageal stricture    ESRD on hemodialysis ([place]) 03/09/2020   Gastritis    Gastroparesis 08/21/2007   GERD (gastroesophageal reflux disease)    Hearing loss of both ears    Hernia, hiatal    Hyperlipidemia    Hypertension    Morbid obesity ([place])    OSTEOARTHRITIS 08/09/2006   Osteoporosis    Oxygen deficiency    prn    PERIPHERAL NEUROPATHY, FEET 09/23/2007   RENAL INSUFFICIENCY 02/16/2009   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   Seizures ([place])    pt thinks it has been several monthes since she had a seisure   Shingles    Sickle cell trait ([place])    Sleep apnea    uses cpap   Stroke [facility])    Past Surgical History:  Procedure Laterality Date   ABDOMINAL HYSTERECTOMY  ARTERY BIOPSY  01/07/2011   Procedure: MINOR BIOPSY TEMPORAL ARTERY;  Surgeon: Haywood Lasso, MD;  Location: [place];  Service: General;  Laterality: Left;  left temporal artery biopsy   AV FISTULA PLACEMENT Right 11/16/2018   Procedure: CREATION RIGHT BRACHIOCEPHALIC FISTULA  ARTERIOVENOUS FISTULA;  Surgeon: Angelia Mould, MD;  Location: [facility] OR;  Service: Vascular;  Laterality: Right;   bil foot surgery     BREAST LUMPECTOMY     benign   BREAST LUMPECTOMY     both breast lumps removed    CATARACT EXTRACTION Left 06/2016   Dr. Read Drivers   COLONOSCOPY     COLONOSCOPY WITH PROPOFOL N/A 07/05/2015    Procedure: COLONOSCOPY WITH PROPOFOL;  Surgeon: Manus Gunning, MD;  Location: [facility];  Service: Gastroenterology;  Laterality: N/A;   COLONOSCOPY WITH PROPOFOL N/A 08/04/2018   Procedure: COLONOSCOPY WITH PROPOFOL;  Surgeon: Yetta Flock, MD;  Location: [facility];  Service: Gastroenterology;  Laterality: N/A;   ERD  08/08/2000   ESOPHAGEAL MANOMETRY N/A 03/13/2015   Procedure: ESOPHAGEAL MANOMETRY (EM);  Surgeon: Manus Gunning, MD;  Location: [facility];  Service: Gastroenterology;  Laterality: N/A;   ESOPHAGOGASTRODUODENOSCOPY  06/25/2006   ESOPHAGOGASTRODUODENOSCOPY (EGD) WITH PROPOFOL N/A 07/05/2015   Procedure: ESOPHAGOGASTRODUODENOSCOPY (EGD) WITH PROPOFOL;  Surgeon: Manus Gunning, MD;  Location: [facility];  Service: Gastroenterology;  Laterality: N/A;   FISTULA SUPERFICIALIZATION Right 11/16/2018   Procedure: Fistula Superficialization;  Surgeon: Angelia Mould, MD;  Location: [facility] OR;  Service: Vascular;  Laterality: Right;   HERNIA REPAIR     IR FLUORO GUIDE CV LINE LEFT  11/10/2020   IR PERC TUN PERIT CATH WO PORT S&I /IMAG  02/17/2020   IR US GUIDE VASC ACCESS LEFT  11/10/2020   IR US GUIDE VASC ACCESS RIGHT  02/18/2020   LEFT AND RIGHT HEART CATHETERIZATION WITH CORONARY ANGIOGRAM N/A 03/03/2013   Procedure: LEFT AND RIGHT HEART CATHETERIZATION WITH CORONARY ANGIOGRAM;  Surgeon: Minus Breeding, MD;  Location: [facility] CATH LAB;  Service: Cardiovascular;  Laterality: N/A;   REPLACEMENT TOTAL KNEE Left 1998   UPPER GASTROINTESTINAL ENDOSCOPY     Family History  Problem Relation Age of Onset   Liver cancer Mother        Liver Cancer   Diabetes Mother    Kidney disease Mother    Heart disease Mother        age 48's   Heart disease Father 62       MI   Heart attack Father        died of MI when pt was 5   Sickle cell anemia Father    Colon cancer Brother    Cancer Brother        Colon Cancer   Diabetes Sister    Kidney disease  Sister    Heart disease Sister        age 59's   Allergies Sister    Diabetes Sister    Kidney disease Sister    Heart disease Sister        age 39's   Esophageal cancer Neg Hx    Rectal cancer Neg Hx    Stomach cancer Neg Hx    Amblyopia Neg Hx    Blindness Neg Hx    Glaucoma Neg Hx    Macular degeneration Neg Hx    Retinal detachment Neg Hx    Cataracts Neg Hx    Strabismus Neg Hx    Retinitis  pigmentosa Neg Hx    Social History:  reports that she quit smoking about 40 years ago. She has a 5.00 pack-year smoking history. She has never used smokeless tobacco. She reports that she does not drink alcohol and does not use drugs.  ROS: As per HPI otherwise negative.  Physical Exam: Vitals:   12/14/20 0445 12/14/20 0530 12/14/20 0615 12/14/20 0714  BP: 116/70 119/68 (!) 133/55 (!) 155/55  Pulse:  97  (!) 107  Resp: 10 18 13 17   Temp:    97.8 F (36.6 C)  TempSrc:    Axillary  SpO2:  100%  94%  Weight:      Height:         General: Elderly, tired appearing female in NAD Head: Normocephalic, atraumatic, sclera non-icteric, mucus membranes are moist. Neck: JVD not elevated. Lungs: Clear bilaterally to auscultation without wheezes, rales, or rhonchi. Breathing is unlabored. Heart: RRR with normal S1, S2. No murmurs, rubs, or gallops appreciated. Abdomen: Soft, non-tender, non-distended with normoactive bowel sounds. No rebound/guarding. No obvious abdominal masses. Lower extremities: No edema b/l lower extremities Neuro: Sleeping, awakens to voice but not opening eyes. Moves all extremities spontaneously. Psych:  Responds to questions appropriately with a normal affect. Dialysis Access: R IJ TDC dressing intact  Allergies  Allergen Reactions   Morphine And Related Other (See Comments)    Family request not to be given, reports pt does not wake up when given    Promethazine Hcl Other (See Comments)    REACTION: lethargy   Prior to Admission medications   Medication Sig  Start Date End Date Taking? Authorizing Provider  acetaminophen (TYLENOL) 325 MG tablet Take 2 tablets (650 mg total) by mouth every 6 (six) hours as needed for mild pain (or Fever >/= 101). 11/23/20   Ezekiel Slocumb, DO  antiseptic oral rinse (BIOTENE) LIQD Apply 15 mLs topically as needed for dry mouth. 11/23/20   Ezekiel Slocumb, DO  glycopyrrolate (ROBINUL) 1 MG tablet Take 1 tablet (1 mg total) by mouth every 4 (four) hours as needed (excessive secretions). 11/23/20   Nicole Kindred A, DO  haloperidol (HALDOL) 0.5 MG tablet Take 1 tablet (0.5 mg total) by mouth every 6 (six) hours as needed for agitation. 11/23/20   Nicole Kindred A, DO  ondansetron (ZOFRAN-ODT) 4 MG disintegrating tablet Take 1 tablet (4 mg total) by mouth every 6 (six) hours as needed for nausea. 11/23/20   Ezekiel Slocumb, DO  oxyCODONE (ROXICODONE INTENSOL) 20 MG/ML concentrated solution Place 0.3 mLs (6 mg total) under the tongue every 2 (two) hours as needed for moderate pain or severe pain (or dyspnea). 11/23/20   Nicole Kindred A, DO  OXYGEN Inhale 2 L into the lungs as needed. CPAP with oxygen at bedtime    [provider]  polyvinyl alcohol (LIQUIFILM TEARS) 1.4 % ophthalmic solution Place 1 drop into both eyes 4 (four) times daily as needed for dry eyes. 11/23/20   Ezekiel Slocumb, DO  senna (SENOKOT) 8.6 MG tablet Take 2 tablets (17.2 mg total) by mouth 2 (two) times daily. May crush, mix with water and give sublingually if needed. 11/23/20   Ezekiel Slocumb, DO   Current Facility-Administered Medications  Medication Dose Route Frequency Provider Last Rate Last Admin   acetaminophen (TYLENOL) tablet 650 mg  650 mg Oral Q6H PRN Lenore Cordia, MD       Or   acetaminophen (TYLENOL) suppository 650 mg  650 mg Rectal Q6H  PRN Lenore Cordia, MD       albuterol (PROVENTIL) (2.5 MG/3ML) 0.083% nebulizer solution 2.5 mg  2.5 mg Nebulization Q2H PRN Lenore Cordia, MD       Chlorhexidine Gluconate Cloth 2 %  PADS 6 each  6 each Topical Q0600 Corliss Parish, MD       haloperidol (HALDOL) tablet 0.5 mg  0.5 mg Oral Q6H PRN Lenore Cordia, MD       heparin ADULT infusion 100 units/mL (25000 units/247mL)  1,100 Units/hr Intravenous Continuous Laren Everts, RPH 11 mL/hr at 12/14/20 0715 1,100 Units/hr at 12/14/20 0715   ondansetron (ZOFRAN) tablet 4 mg  4 mg Oral Q6H PRN Lenore Cordia, MD       Or   ondansetron (ZOFRAN) injection 4 mg  4 mg Intravenous Q6H PRN Lenore Cordia, MD       sodium chloride flush (NS) 0.9 % injection 3 mL  3 mL Intravenous Q12H Lenore Cordia, MD       Current Outpatient Medications  Medication Sig Dispense Refill   acetaminophen (TYLENOL) 325 MG tablet Take 2 tablets (650 mg total) by mouth every 6 (six) hours as needed for mild pain (or Fever >/= 101).     antiseptic oral rinse (BIOTENE) LIQD Apply 15 mLs topically as needed for dry mouth. 473 mL 0   glycopyrrolate (ROBINUL) 1 MG tablet Take 1 tablet (1 mg total) by mouth every 4 (four) hours as needed (excessive secretions). 30 tablet 0   haloperidol (HALDOL) 0.5 MG tablet Take 1 tablet (0.5 mg total) by mouth every 6 (six) hours as needed for agitation. 30 tablet 0   ondansetron (ZOFRAN-ODT) 4 MG disintegrating tablet Take 1 tablet (4 mg total) by mouth every 6 (six) hours as needed for nausea. 20 tablet 0   oxyCODONE (ROXICODONE INTENSOL) 20 MG/ML concentrated solution Place 0.3 mLs (6 mg total) under the tongue every 2 (two) hours as needed for moderate pain or severe pain (or dyspnea). 30 mL 0   OXYGEN Inhale 2 L into the lungs as needed. CPAP with oxygen at bedtime     polyvinyl alcohol (LIQUIFILM TEARS) 1.4 % ophthalmic solution Place 1 drop into both eyes 4 (four) times daily as needed for dry eyes. 15 mL 0   senna (SENOKOT) 8.6 MG tablet Take 2 tablets (17.2 mg total) by mouth 2 (two) times daily. May crush, mix with water and give sublingually if needed. 28 tablet 0   Labs: Basic Metabolic  Panel: Recent Labs  Lab 12/14/20 0332  NA 149*  K 6.2*  CL 111  CO2 26  GLUCOSE 119*  BUN 111*  CREATININE 16.40*  CALCIUM 8.6*   Liver Function Tests: No results for input(s): AST, ALT, ALKPHOS, BILITOT, PROT, ALBUMIN in the last 168 hours. No results for input(s): LIPASE, AMYLASE in the last 168 hours. No results for input(s): AMMONIA in the last 168 hours. CBC: No results for input(s): WBC, NEUTROABS, HGB, HCT, MCV, PLT in the last 168 hours. Cardiac Enzymes: No results for input(s): CKTOTAL, CKMB, CKMBINDEX, TROPONINI in the last 168 hours. CBG: No results for input(s): GLUCAP in the last 168 hours. Iron Studies: No results for input(s): IRON, TIBC, TRANSFERRIN, FERRITIN in the last 72 hours. Studies/Results: CT Angio Chest PE W and/or Wo Contrast  Result Date: 12/14/2020 CLINICAL DATA:  found to have low oxygen and placed on 2L [place]. PE suspected, low/intermediate prob, positive D-dimer EXAM: CT ANGIOGRAPHY CHEST WITH CONTRAST TECHNIQUE:  Multidetector CT imaging of the chest was performed using the standard protocol during bolus administration of intravenous contrast. Multiplanar CT image reconstructions and MIPs were obtained to evaluate the vascular anatomy. CONTRAST:  74mL OMNIPAQUE IOHEXOL 350 MG/ML SOLN COMPARISON:  None. FINDINGS: Lines and tubes: Dialysis catheter with tip terminating at the inferior cavoatrial junction. Cardiovascular: Satisfactory opacification of the pulmonary arteries to the segmental level. Left lower lobe segmental pulmonary embolus. Normal heart size. No pericardial effusion. Atherosclerotic plaque. At least right main coronary artery calcification. Incidentally noted duplicated superior vena cava. Mediastinum/Nodes: No enlarged mediastinal, hilar, or axillary lymph nodes. Thyroid gland, trachea, and esophagus demonstrate no significant findings. Debris within the right mainstem bronchi. Elevated right hemidiaphragm. Lungs/Pleura: Trace consolidation with  air bronchogram at the right base. No pulmonary nodule. No pulmonary mass. No pleural effusion. No pneumothorax. Upper Abdomen: No acute abnormality. Musculoskeletal: No chest wall abnormality. No suspicious lytic or blastic osseous lesions. No acute displaced fracture. Multilevel degenerative changes of the spine. Partially visualized right upper extremity with likely severe degenerative changes of the wrist. Review of the MIP images confirms the above findings. IMPRESSION: 1. Left lower lobe segmental pulmonary embolus. No findings of right heart strain or pulmonary infarction. 2. Debris within the right mainstem bronchi with trace consolidation at the right base which may be due to atelectasis in the setting of an elevated right hemidiaphragm versus developing infection/inflammation. 3. Dialysis catheter with tip terminating at the inferior cavoatrial junction. 4.  Aortic Atherosclerosis (ICD10-I70.0). These results were called by telephone at the time of interpretation on 12/14/2020 at 12:04 am to provider Dr. Wyvonnia Dusky, who verbally acknowledged these results. Electronically Signed   By: Iven Finn M.D.   On: 12/14/2020 00:15   DG Chest Port 1 View  Result Date: 12/13/2020 CLINICAL DATA:  Hypoxia, short of breath EXAM: PORTABLE CHEST 1 VIEW COMPARISON:  11/12/2020 FINDINGS: Single frontal view of the chest demonstrates stable right internal jugular dialysis catheter. The cardiac silhouette is unremarkable. No airspace disease, effusion, or pneumothorax. Chronic elevation right hemidiaphragm. IMPRESSION: 1. No acute intrathoracic process. Electronically Signed   By: Randa Ngo M.D.   On: 12/13/2020 16:37    Dialysis Orders:  Former orders:  [place]  on TTS . 180NRe, 4 hours, BFR 400, DFR 500, EDW 77kg 4K, 2.25Ca, TDC Heparin 6000 unit bolus Mircera 283mcg IV q 2 weeks- last dose 10/31/20 Hectorol 14mcg IV q HD  Assessment/Plan: Hypoxia: CT angio of the chest showed left lower segmental lobe  embolus.  Restarted on heparin.  She did have a PE during last admission but was refusing oral Eliquis.  We will attempt to optimize volume status with dialysis.  Management per primary team.  ESRD: Family transition to comfort care after last admission, then decided to restart dialysis.  Patient had 1 hour of dialysis on 12/09/2020.  Potassium, BUN and creatinine are all significantly elevated.  Plan for 2-hour dialysis treatment this morning, will plan for HD again tomorrow and titrate slowly to full prescription.  He did have significant agitation last admission, will need to be able to tolerate dialysis here without restraints before she is appropriate for outpatient  Hypertension/volume: BP variable, soft to mildly elevated.  Does not appear to have significant volume overload on chest CT or exam.  UF goal 2 to 3 L as tolerated today.  Anemia: CBC is still pending, suspect she is likely overdue for ESA, will follow hemoglobin.  Metabolic bone disease: Calcium 8.6, will need to check albumin.  No Phos reported yet however it was less than 1 during last admission and she refused both IV and p.o. potassium supplementation.  Nutrition: We will check albumin with next labs. Dispo: Patient was agitated and refused p.o. medications during last admission.  Her mental status seems to be the same as last admission at this time.  She was made DNR and transition to hospice however patient's family revoked this and made her full code shortly after.  Recommend ongoing palliative care discussions.  Anice Paganini, PA-C 12/14/2020, 8:18 AM  [facility] Pager: [phone]  Patient seen and examined, agree with above note with above modifications. Terrible situation-  demented patient who is getting no benefit from HD-  had signed off appropriately and now back on HD due to family persistence.  Now with PE-  had not had adequate HD of late due to instability so has high K and BUN necessitating HD  on the holiday.  Will plan HD tomorrow as well given how abnormal her labs are  Corliss Parish, MD 12/15/2020

## 2020-12-14 NOTE — ED Notes (Signed)
Pt back to ED for dialysis. Pt with dementia, non-verbal. Will not take PO medications. Pt placed back on cardiac monitoring and V/S updated.

## 2020-12-14 NOTE — Progress Notes (Signed)
Patient is currently in mittens and confused.  Will hold off on placing CPAP on patient.  Patient vitals are WNL.  Patient is wearing 2L [place] and no signs of distress at this time.

## 2020-12-15 DIAGNOSIS — I2699 Other pulmonary embolism without acute cor pulmonale: Secondary | ICD-10-CM | POA: Diagnosis not present

## 2020-12-15 LAB — RENAL FUNCTION PANEL
Albumin: 2.2 g/dL — ABNORMAL LOW (ref 3.5–5.0)
Anion gap: 12 (ref 5–15)
BUN: 43 mg/dL — ABNORMAL HIGH (ref 8–23)
CO2: 25 mmol/L (ref 22–32)
Calcium: 7.9 mg/dL — ABNORMAL LOW (ref 8.9–10.3)
Chloride: 102 mmol/L (ref 98–111)
Creatinine, Ser: 8.72 mg/dL — ABNORMAL HIGH (ref 0.44–1.00)
GFR, Estimated: 4 mL/min — ABNORMAL LOW (ref >60)
Glucose, Bld: 69 mg/dL — ABNORMAL LOW (ref 70–99)
Phosphorus: 3.4 mg/dL (ref 2.5–4.6)
Potassium: 3.6 mmol/L (ref 3.5–5.1)
Sodium: 139 mmol/L (ref 135–145)

## 2020-12-15 LAB — HEPARIN LEVEL (UNFRACTIONATED)
Heparin Unfractionated: 1.08 IU/mL — ABNORMAL HIGH (ref 0.30–0.70)
Heparin Unfractionated: 0.79 IU/mL — ABNORMAL HIGH (ref 0.30–0.70)

## 2020-12-15 LAB — CBC
HCT: 27.3 % — ABNORMAL LOW (ref 36.0–46.0)
Hemoglobin: 8.4 g/dL — ABNORMAL LOW (ref 12.0–15.0)
MCH: 25.3 pg — ABNORMAL LOW (ref 26.0–34.0)
MCHC: 30.8 g/dL (ref 30.0–36.0)
MCV: 82.2 fL (ref 80.0–100.0)
Platelets: 185 10*3/uL (ref 150–400)
RBC: 3.32 MIL/uL — ABNORMAL LOW (ref 3.87–5.11)
RDW: 20.9 % — ABNORMAL HIGH (ref 11.5–15.5)
WBC: 8.5 10*3/uL (ref 4.0–10.5)
nRBC: 0 % (ref 0.0–0.2)

## 2020-12-15 LAB — HEPATITIS B SURFACE ANTIBODY, QUANTITATIVE: Hep B S AB Quant (Post): 76.2 m[IU]/mL (ref >9.9)

## 2020-12-15 LAB — GLUCOSE, CAPILLARY: Glucose-Capillary: 53 mg/dL — ABNORMAL LOW (ref 70–99)

## 2020-12-15 MED ORDER — HEPARIN SODIUM (PORCINE) 1000 UNIT/ML IJ SOLN
INTRAMUSCULAR | Status: AC
  Administered 2020-12-15: 1000 [IU]
  Filled 2020-12-15: qty 4

## 2020-12-15 MED ORDER — INSULIN ASPART 100 UNIT/ML IJ SOLN: 0.0000 [IU] | Freq: Every day | INTRAMUSCULAR | Status: DC

## 2020-12-15 MED ORDER — INSULIN ASPART 100 UNIT/ML IJ SOLN: 0.0000 [IU] | Freq: Three times a day (TID) | INTRAMUSCULAR | Status: DC

## 2020-12-15 MED ORDER — DARBEPOETIN ALFA 200 MCG/0.4ML IJ SOSY
200.0000 ug | PREFILLED_SYRINGE | INTRAMUSCULAR | Status: DC
  Filled 2020-12-15: qty 0.4

## 2020-12-15 NOTE — Progress Notes (Signed)
Subjective:  Seen in HD -  alert, non verbal-  hemodynamically looking much better today  Objective Vital signs in last 24 hours: Vitals:   12/15/20 0819 12/15/20 0830 12/15/20 0900 12/15/20 0930  BP: 107/63 (!) 114/55 (!) 146/79 (!) 98/52  Pulse: 85 100 69 89  Resp:  19 13 16   Temp:      TempSrc:      SpO2:  100% 100% 100%  Weight:      Height:       Weight change: 0 kg  Intake/Output Summary (Last 24 hours) at 12/15/2020 0949 Last data filed at 12/15/2020 1941 Gross per 24 hour  Intake 511.67 ml  Output 0 ml  Net 511.67 ml   Dialysis Orders:  Former orders:  [place]  on TTS . 180NRe, 4 hours, BFR 400, DFR 500, EDW 77kg 4K, 2.25Ca, TDC Heparin 6000 unit bolus Mircera 220mcg IV q 2 weeks- last dose 10/31/20 Hectorol 7mcg IV q HD    Assessment/Plan: Hypoxia: CT angio of the chest showed left lower segmental lobe embolus.  Restarted on heparin.  She did have a PE during last admission but was refusing oral Eliquis.  We will attempt to optimize volume status with dialysis.  Management per primary team.  ESRD: Family transition to comfort care after last admission, then decided to restart dialysis.  Patient had 1 hour of dialysis on 12/09/2020.  Potassium, BUN and creatinine were all significantly elevated.  Had 2-hour dialysis treatment 11/24,  HD again today.  did have significant agitation last admission, will need to be able to tolerate dialysis here without restraints before she is appropriate for outpatient.  Not sure timing of next HD -  likely Monday ?   Hypertension/volume: BP variable, soft to mildly elevated.  Does not appear to have significant volume overload on chest CT or exam.  UF goal 2 to 3 L as tolerated today.  Anemia: hgb 8.4-  resume ESA  Metabolic bone disease: Corrected calc and phos OK-  no meds   Nutrition: We will check albumin with next labs. Dispo: Patient was agitated and refused p.o. medications during last admission.  Her mental status seems to be the  same as last admission at this time.  She was made DNR and transition to hospice however patient's family revoked this and made her full code shortly after.  Recommend ongoing palliative care discussions but need to continue HD for now    [place]: Basic Metabolic Panel: Recent Labs  Lab 12/14/20 0332 12/15/20 0825  NA 149* 139  K 6.2* 3.6  CL 111 102  CO2 26 25  GLUCOSE 119* 69*  BUN 111* 43*  CREATININE 16.40* 8.72*  CALCIUM 8.6* 7.9*  PHOS  --  3.4   Liver Function Tests: Recent Labs  Lab 12/15/20 0825  ALBUMIN 2.2*   No results for input(s): LIPASE, AMYLASE in the last 168 hours. No results for input(s): AMMONIA in the last 168 hours. CBC: Recent Labs  Lab 12/15/20 0500  WBC 8.5  HGB 8.4*  HCT 27.3*  MCV 82.2  PLT 185   Cardiac Enzymes: No results for input(s): CKTOTAL, CKMB, CKMBINDEX, TROPONINI in the last 168 hours. CBG: No results for input(s): GLUCAP in the last 168 hours.  Iron Studies: No results for input(s): IRON, TIBC, TRANSFERRIN, FERRITIN in the last 72 hours. Studies/Results: CT Angio Chest PE W and/or Wo Contrast  Result Date: 12/14/2020 CLINICAL DATA:  found to have low oxygen  and placed on 2L [place]. PE suspected, low/intermediate prob, positive D-dimer EXAM: CT ANGIOGRAPHY CHEST WITH CONTRAST TECHNIQUE: Multidetector CT imaging of the chest was performed using the standard protocol during bolus administration of intravenous contrast. Multiplanar CT image reconstructions and MIPs were obtained to evaluate the vascular anatomy. CONTRAST:  34mL OMNIPAQUE IOHEXOL 350 MG/ML SOLN COMPARISON:  None. FINDINGS: Lines and tubes: Dialysis catheter with tip terminating at the inferior cavoatrial junction. Cardiovascular: Satisfactory opacification of the pulmonary arteries to the segmental level. Left lower lobe segmental pulmonary embolus. Normal heart size. No pericardial effusion. Atherosclerotic plaque. At least right main coronary  artery calcification. Incidentally noted duplicated superior vena cava. Mediastinum/Nodes: No enlarged mediastinal, hilar, or axillary lymph nodes. Thyroid gland, trachea, and esophagus demonstrate no significant findings. Debris within the right mainstem bronchi. Elevated right hemidiaphragm. Lungs/Pleura: Trace consolidation with air bronchogram at the right base. No pulmonary nodule. No pulmonary mass. No pleural effusion. No pneumothorax. Upper Abdomen: No acute abnormality. Musculoskeletal: No chest wall abnormality. No suspicious lytic or blastic osseous lesions. No acute displaced fracture. Multilevel degenerative changes of the spine. Partially visualized right upper extremity with likely severe degenerative changes of the wrist. Review of the MIP images confirms the above findings. IMPRESSION: 1. Left lower lobe segmental pulmonary embolus. No findings of right heart strain or pulmonary infarction. 2. Debris within the right mainstem bronchi with trace consolidation at the right base which may be due to atelectasis in the setting of an elevated right hemidiaphragm versus developing infection/inflammation. 3. Dialysis catheter with tip terminating at the inferior cavoatrial junction. 4.  Aortic Atherosclerosis (ICD10-I70.0). These results were called by telephone at the time of interpretation on 12/14/2020 at 12:04 am to provider Dr. Wyvonnia Dusky, who verbally acknowledged these results. Electronically Signed   By: Iven Finn M.D.   On: 12/14/2020 00:15   DG Chest Port 1 View  Result Date: 12/13/2020 CLINICAL DATA:  Hypoxia, short of breath EXAM: PORTABLE CHEST 1 VIEW COMPARISON:  11/12/2020 FINDINGS: Single frontal view of the chest demonstrates stable right internal jugular dialysis catheter. The cardiac silhouette is unremarkable. No airspace disease, effusion, or pneumothorax. Chronic elevation right hemidiaphragm. IMPRESSION: 1. No acute intrathoracic process. Electronically Signed   By: Randa Ngo M.D.   On: 12/13/2020 16:37   Medications: Infusions:  heparin 900 Units/hr (12/15/20 0512)    Scheduled Medications:  Chlorhexidine Gluconate Cloth  6 each Topical Q0600   heparin sodium (porcine)       sodium chloride flush  3 mL Intravenous Q12H    have reviewed scheduled and prn medications.  Physical Exam: General:  alert, looking around- non verbal Heart: RRR Lungs: mostly clear Abdomen: soft, non tender Extremities: obese legs-  dep edema  Dialysis Access: TDC     12/15/2020,9:49 AM  LOS: 1 day

## 2020-12-15 NOTE — Progress Notes (Signed)
PROGRESS NOTE  Joann Gutierrez  DOB: 1946-01-08  PCP: Andree Moro, DO YBO:[id number]  DOA: 12/13/2020  LOS: 1 day  Hospital Day: 3  Chief Complaint  Patient presents with   Hypoxia    Brief narrative: Joann Gutierrez is a 75 y.o. female with PMH significant for advanced dementia who is nonverbal and wheelchair-bound, ESRD on TTS HD, COPD, OSA, history of PE not currently on anticoagulation. Patient was brought to the ED on 7/23 from [facility] for evaluation of hypoxia.  Patient was recently admitted 11/09/2020-11/23/2020 for hypoxia secondary to acute left upper lobe PE.  She was initially on IV heparin and transition to oral Eliquis however patient began to refuse oral medications. Per discharge summary, extensive goals of care discussions with palliative care, nephrology, primary team, and family resulted in transition to DNR/DNI status and patient has discharged to home with hospice care.  Patient's sister states that while at home patient appeared to be improving and more interactive. Family subsequently reversed her hospice and restarted dialysis.  On 11/23, she went for follow-up with her PCP.  She was noted to have tachycardia and low oxygen level and hence sent to the ED for further evaluation.  In the ED, she was afebrile, heart rate 103, blood pressure 109/80, oxygen saturation 100% on 2 L oxygen by nasal cannula. Labs showed troponin 31, D-dimer 2.43, BNP 134.9.  CT angio chest showed left lower lobe segmental pulmonary embolus without findings of right heart strain or pulmonary infarction.  Debris within the right mainstem bronchi with trace consolidation at the right base seen which may be due to atelectasis in setting of right hemidiaphragm elevation.  Dialysis catheter with tip terminating in the inferior cavoatrial junction noted.   Patient was started on IV heparin. She was kept on observation in hospitalist service for further evaluation  management  Subjective: Patient was seen and examined this morning in dialysis.  Likely demented female, nonverbal at baseline, alert, awake, unable to respond to verbal command.  Not restless or agitated.  Assessment/Plan: Acute pulmonary embolism Recent history of pulm embolism -refused treatment at the time -Left lower lobe segmental PE seen on CTA chest without findings of right heart strain or pulmonary infarction.  Her last hospitalization was for left upper lobe pulm embolism and patient at that time chose not to be anticoagulated. -It seems patient and family has reversed the CODE STATUS and aggressiveness of care. -Currently patient is on IV heparin.  Patient and family had reluctance to switch him to Eliquis last admission.  I tried to reach her sister on the phone this morning.  Unable   Acute respiratory failure with hypoxia -On 2 to 3 L supplemental oxygen.  ESRD HD TTS -Nephrology consulted -Noted a plan for dialysis today.   Goals of care: Admitting MD had a long discussion with patient's sister over the phone regarding patient's chronic incurable medical conditions and that she would be unlikely to benefit from any aggressive intervention in the setting of cardiac failure or CODE BLUE event and that these would likely cause more suffering and worsening quality of life.  Per note, sister stated that they have revoked hospice care and reversed CODE STATUS to full code.  Would benefit from further palliative care discussions going forward.  Palliative care consult ordered   COPD: Continue albuterol as needed.   OSA: CPAP nightly.   Advanced dementia with behavioral disturbance: Appears to be at baseline nonverbal status.  Continue Haldol as needed and  delirium precautions.  Mobility: Bedbound  Living condition: [employer] [facility] Goals of care:   Code Status: Full Code  Nutritional status: Body mass index is 27.44 kg/m.     Diet:  Diet Order             DIET DYS  3 Room service appropriate? Yes; Fluid consistency: Thin  Diet effective now                  DVT prophylaxis: Heparin drip.   Antimicrobials: None Fluid: None Consultants: Nephrology Family Communication: None at bedside.  Unable to reach the sister on phone this morning  Status is: Observation  Remains inpatient appropriate because: Pending dialysis  Dispo: The patient is from: [employer] [facility]              Anticipated d/c is to: Back to facility              Patient currently is not medically stable to d/c.   Difficult to place patient No    Infusions:   heparin 900 Units/hr (12/15/20 0512)    Scheduled Meds:  Chlorhexidine Gluconate Cloth  6 each Topical Q0600   darbepoetin (ARANESP) injection - DIALYSIS  200 mcg Intravenous Q Fri-HD   sodium chloride flush  3 mL Intravenous Q12H    PRN meds: acetaminophen **OR** acetaminophen, albuterol, haloperidol, ondansetron **OR** ondansetron (ZOFRAN) IV   Antimicrobials: Anti-infectives (From admission, onward)    None       Objective: Vitals:   12/15/20 1100 12/15/20 1120  BP: (!) 91/46 110/61  Pulse: 94 95  Resp:  (!) 21  Temp:  (!) 97.1 F (36.2 C)  SpO2:  98%    Intake/Output Summary (Last 24 hours) at 12/15/2020 1151 Last data filed at 12/15/2020 1120 Gross per 24 hour  Intake 511.67 ml  Output 1500 ml  Net -988.33 ml   Filed Weights   12/15/20 0500 12/15/20 0813 12/15/20 1120  Weight: 76.7 kg 74 kg 72.5 kg   Weight change: 0 kg Body mass index is 27.44 kg/m.   Physical Exam: General exam: Elderly African-American female with dementia.  Has mittens on both hands Skin: No rashes, lesions or ulcers. HEENT: Atraumatic, normocephalic, no obvious bleeding Lungs: Clear to auscultation bilaterally CVS: Regular rate and rhythm, no murmur GI/Abd soft, nontender, nondistended, bowel sound present CNS: Alert, awake, nonverbal at baseline Psychiatry: Sad affect Extremities: Trace bilateral pedal  edema  Data Review: I have personally reviewed the laboratory data and studies available.  F/u labs ordered Unresulted Labs (From admission, onward)     Start     Ordered   12/15/20 0500  CBC  Daily,   R      12/14/20 1232   12/15/20 0500  Heparin level (unfractionated)  Daily,   R      12/14/20 1240   12/13/20 6045  Basic metabolic panel  (ED Chest Pain)  Once,   STAT        12/13/20 1401            Signed, Terrilee Croak, MD [facility] 12/15/2020

## 2020-12-15 NOTE — Progress Notes (Signed)
RT NOTE:  Pt is wearing mittens and confused. RT unable to put CPAP on patient at this time.

## 2020-12-15 NOTE — Progress Notes (Signed)
Patient back from HD. NAD noted. VS checked and cardiac monitor connected. Heparin gtt at 71ml/hr running. Bed kept in low position and locked. Call bell in reach.

## 2020-12-15 NOTE — Progress Notes (Signed)
ANTICOAGULATION CONSULT NOTE  Pharmacy Consult for heparin Indication: pulmonary embolus  Allergies  Allergen Reactions   Morphine And Related Other (See Comments)    Family request not to be given, reports pt does not wake up when given    Gabapentin Other (See Comments)    Pt is hard to wake up when this is given per sister    Oxycodone Other (See Comments)    Pt is hard to wake up when this is given per sister   Promethazine Hcl Other (See Comments)    lethargy    Patient Measurements: Height: 5\' 4"  (162.6 cm) Weight: 72.5 kg (159 lb 13.3 oz) IBW/kg (Calculated) : 54.7 Heparin Dosing Weight: 70kg  Vital Signs: Temp: 97.8 F (36.6 C) (11/25 1157) Temp Source: Axillary (11/25 1157) BP: 126/49 (11/25 1157) Pulse Rate: 92 (11/25 1157)  Labs: Recent Labs    12/13/20 1602 12/14/20 0332 12/14/20 0830 12/14/20 1909 12/15/20 0500 12/15/20 0825 12/15/20 1442  HGB  --   --   --   --  8.4*  --   --   HCT  --   --   --   --  27.3*  --   --   PLT  --   --   --   --  185  --   --   HEPARINUNFRC  --   --  >1.10* 0.61  --   --  1.08*  CREATININE  --  16.40*  --   --   --  8.72*  --   TROPONINIHS 31*  --   --   --   --   --   --      Estimated Creatinine Clearance: 5.4 mL/min (A) (by C-G formula based on SCr of 8.72 mg/dL (H)).   Assessment: 75yo female with ESRD HD TTS, recently admitted 11/09/20-11/23/20 for hypoxia 2/2 acute left lower lobe PE. At that time the patient was refusing oral medications, so was switched to hospice care with DNR status. Sister thought patient was improving, so DNR status was reversed to full code. Patient readmitted with hypoxia as she has advanced dementia and is continuing refusal of oral medications. Pharmacy has been consulted for heparin dosing.   Heparin level is supra-therapeutic at 1.08 units/mL.  Spoke to phlebotomist, lab was obtained from the same arm as the heparin infusion because the other arm is restricted; it was collected distal  to the IV site.  Given elevated level, suspect it could be contaminated since the previous level was therapeutic at 0.61 units/mL.  Per phlebotomist, unable to do a footstick because patient is diabetic.   Goal of Therapy:  Heparin level 0.3-0.7 units/ml Monitor platelets by anticoagulation protocol: Yes   Plan:  Reduce heparin infusion to 800 units/hr F/U AM labs  Zuleyka Kloc D. Mina Marble, PharmD, BCPS, [place] 12/15/2020, 4:58 PM

## 2020-12-15 NOTE — Plan of Care (Signed)

## 2020-12-15 NOTE — Procedures (Signed)
Patient was seen on dialysis and the procedure was supervised.  BFR 350  Via TDC BP is  98/52.   Patient appears to be tolerating treatment well  [name] 12/15/2020

## 2020-12-15 NOTE — Progress Notes (Signed)
Patient transported to HD via bed at this time. Oxygen at 2L [place], heparin gtt at 37ml/hr running during the time of transportation.

## 2020-12-16 DIAGNOSIS — J9601 Acute respiratory failure with hypoxia: Secondary | ICD-10-CM

## 2020-12-16 DIAGNOSIS — Z7189 Other specified counseling: Secondary | ICD-10-CM

## 2020-12-16 DIAGNOSIS — D649 Anemia, unspecified: Secondary | ICD-10-CM

## 2020-12-16 DIAGNOSIS — N186 End stage renal disease: Secondary | ICD-10-CM

## 2020-12-16 DIAGNOSIS — J441 Chronic obstructive pulmonary disease with (acute) exacerbation: Secondary | ICD-10-CM

## 2020-12-16 DIAGNOSIS — Z992 Dependence on renal dialysis: Secondary | ICD-10-CM

## 2020-12-16 DIAGNOSIS — I2699 Other pulmonary embolism without acute cor pulmonale: Secondary | ICD-10-CM | POA: Diagnosis not present

## 2020-12-16 DIAGNOSIS — Z515 Encounter for palliative care: Secondary | ICD-10-CM

## 2020-12-16 DIAGNOSIS — F0280 Dementia in other diseases classified elsewhere without behavioral disturbance: Secondary | ICD-10-CM

## 2020-12-16 DIAGNOSIS — F03918 Unspecified dementia, unspecified severity, with other behavioral disturbance: Secondary | ICD-10-CM | POA: Diagnosis not present

## 2020-12-16 DIAGNOSIS — R0902 Hypoxemia: Secondary | ICD-10-CM

## 2020-12-16 LAB — CBC
HCT: 30.9 % — ABNORMAL LOW (ref 36.0–46.0)
Hemoglobin: 10 g/dL — ABNORMAL LOW (ref 12.0–15.0)
MCH: 25.9 pg — ABNORMAL LOW (ref 26.0–34.0)
MCHC: 32.4 g/dL (ref 30.0–36.0)
MCV: 80.1 fL (ref 80.0–100.0)
Platelets: 199 10*3/uL (ref 150–400)
RBC: 3.86 MIL/uL — ABNORMAL LOW (ref 3.87–5.11)
RDW: 21.2 % — ABNORMAL HIGH (ref 11.5–15.5)
WBC: 10.9 10*3/uL — ABNORMAL HIGH (ref 4.0–10.5)
nRBC: 0 % (ref 0.0–0.2)

## 2020-12-16 LAB — GLUCOSE, CAPILLARY
Glucose-Capillary: 121 mg/dL — ABNORMAL HIGH (ref 70–99)
Glucose-Capillary: 69 mg/dL — ABNORMAL LOW (ref 70–99)
Glucose-Capillary: 73 mg/dL (ref 70–99)
Glucose-Capillary: 84 mg/dL (ref 70–99)
Glucose-Capillary: 89 mg/dL (ref 70–99)
Glucose-Capillary: 91 mg/dL (ref 70–99)

## 2020-12-16 LAB — HEPARIN LEVEL (UNFRACTIONATED): Heparin Unfractionated: 0.62 IU/mL (ref 0.30–0.70)

## 2020-12-16 MED ORDER — ENSURE ENLIVE PO LIQD
237.0000 mL | Freq: Three times a day (TID) | ORAL | Status: DC
  Administered 2020-12-16: 237 mL via ORAL

## 2020-12-16 MED ORDER — DEXTROSE 50 % IV SOLN
12.5000 g | INTRAVENOUS | Status: AC
  Administered 2020-12-16: 12.5 g via INTRAVENOUS
  Filled 2020-12-16: qty 50

## 2020-12-16 NOTE — Plan of Care (Signed)
  Problem: Education: Goal: Knowledge of General Education information will improve Description: Including pain rating scale, medication(s)/side effects and non-pharmacologic comfort measures Outcome: Progressing   Problem: Clinical Measurements: Goal: Will remain free from infection Outcome: Progressing   Problem: Nutrition: Goal: Adequate nutrition will be maintained Outcome: Progressing   Problem: Safety: Goal: Ability to remain free from injury will improve Outcome: Progressing   Problem: Skin Integrity: Goal: Risk for impaired skin integrity will decrease Outcome: Progressing

## 2020-12-16 NOTE — Consult Note (Signed)
Palliative Medicine Inpatient Consult Note  Reason for consult:  GOC  HPI:  Per intake H&P - by Dr. Posey Pronto 11/24-> "Joann Gutierrez is a 75 y.o. female with medical history significant for advanced dementia who is nonverbal and wheelchair-bound, ESRD on TTS HD, COPD, history of PE not currently on anticoagulation, OSA who presented to the ED from [place] for evaluation of hypoxia. history supplemented by patient's sister and point of contact Joann Gutierrez.  Patient recently admitted 11/09/2020-11/23/2020 for hypoxia secondary to acute left upper lobe PE.  She was initially on IV heparin and transition to oral Eliquis however patient began to refuse oral medications.  Per discharge summary, extensive goals of care discussions with palliative care, nephrology, primary team, and family resulted in transition to DNR/DNI status and discharged to home with hospice care.  Patient's sister states that while at home patient appears to be improving and more interactive.  Family subsequently reversed her hospice and she has been undergoing dialysis since that time.  She had follow-up with her PCP morning of admission.  She was noted to have an elevated heart rate and low oxygen levels and subsequently sent to the ED for further evaluation."  CTA showed left Lower lobe segmental PE without findings of right heart strain or pulmonary infarction.      Clinical Assessment/Goals of Care: I have reviewed medical records including EPIC notes, labs and imaging, received report from bedside RN, assessed the patient.    I met with Sister Joann Gutierrez [phone] to further discuss diagnosis prognosis, GOC, EOL wishes, disposition and options. The MPOA on the chart from  03/23/2020 has the niece, Joann Gutierrez as the MPOA.  The number on that form is not in working order.The sister states there is a newer POA and she it it. I asked her to bring in a copy.   The sister brought in [name] paperwork  and the agent is designated as the son Joann Gutierrez, Joann Gutierrez. With second alternate [facility].   I met with Joann Gutierrez by phone [phone]).  He states that he makes decsions in consultation with his aunt, Joann Gutierrez. I introduced Palliative Medicine as specialized medical care for people living with serious illness. It focuses on providing relief from the symptoms and stress of a serious illness. The goal is to improve quality of life for both the patient and the family.  A detailed discussion was had today regarding advanced directives.  Concepts specific to code status, artifical feeding and hydration, continued IV antibiotics and rehospitalization was had.  The difference between a aggressive medical intervention path  and a palliative comfort care path for this patient at this time was had. Values and goals of care important to patient and family were attempted to be elicited.   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 She has only the one son. Other support people are her sister and niece. She is of christian faith but he declined chaplain support at this time. I advised that he could request chaplain support at anytime during her stay.  We reviewed the last admission, transition to hospice, revoke of hospice and this admission. He says Joann Gutierrez is a different person at home. Prior to this admission she was able to feed herself. He stays at his mother's house when she is at home. He has his own house also. His aunt and his cousin  help with bathing and taking care of Joann Gutierrez as needed. He says his  mom will shut down if she gets irritated at the hospital. We reviewed the seriousness of her condition. He wants to keep her as a full code  and wants full scope of treatment. I shared that she would not be able to do outpatient dialysis unless she is without restraints and with sitter per nephrology. He has not visited her since she was admitted. I encourage  him to come to the hospital to see her.  He states she does not complain about anything. He denies any signs of pain, SOB or other symptoms.   Discussed the importance of continued conversation with family and their  medical providers regarding overall plan of care and treatment options, ensuring decisions are within the context of the patients values and GOCs.  Decision Maker: son Joann Gutierrez, Joann Gutierrez, second agent is sister Joann Gutierrez) Joann Gutierrez.   SUMMARY OF RECOMMENDATIONS    Code Status/Advance Care Planning: FULL CODE   Symptom Management:  Pain- acetaminophen prn Breathlessness, O2 prn, CPAP,  albuterol prn Agitation- haldol prn Nausea- ondanseton prn    Additional Recommendations (Limitations, Scope, Preferences): PM to shadow chart for symptom management and continued GOA discussions if condition changes. Please reach out of change in status would benefit from further conversations.   Psycho-social/Spiritual:  Desire for further Chaplaincy support: not at this time Additional Recommendations:    Prognosis:   Discharge Planning: Family wishes for her to be back home with family providing care.   Vitals with BMI 12/16/2020 12/16/2020 12/16/2020  Height - - -  Weight - 162 lbs 15 oz -  BMI - 42.10 -  Systolic - - 312  Diastolic - - 55  Pulse 74 - 79    PPS: 30%   This conversation/these recommendations were sent via secure message with patient primary care team, Dr. Nicki Guadalajara.  Thank you for the opportunity to participate in the care of this patient and family.   Time In: 0800 Time Out:0910 Total Time: >70 minutes Greater than 50%  of this time was spent counseling and coordinating care related to the above assessment and plan.  Lindell Spar, NP [facility] Palliative Medicine Team Team Cell Phone: [phone] Please utilize secure chat with additional questions, if there is no response within 30 minutes please call the above phone number  Palliative  Medicine Team providers are available by phone from 7am to 7pm daily and can be reached through the team cell phone.  Should this patient require assistance outside of these hours, please call the patient's attending physician.

## 2020-12-16 NOTE — Progress Notes (Addendum)
[facility] Progress Note   Subjective: Seen in room, sister attempting to feed pt. Patient opens eyes, non verbal.   Were able to do HD yest-  removed 1500-  tolerated better   Objective Vitals:   12/15/20 2002 12/16/20 0353 12/16/20 0458 12/16/20 0800  BP: (!) 148/63 (!) 129/55    Pulse: 96 79  74  Resp: 16 16    Temp: 99.5 F (37.5 C) 98 F (36.7 C)  98.5 F (36.9 C)  TempSrc: Axillary Axillary  Axillary  SpO2: 100% 100%  100%  Weight:   73.9 kg   Height:       Physical Exam General: Chronically ill elderly female with mittens on hands. Calm at present.  Heart: S1,S2 RRR  Lungs: CTAB Abdomen: Obese active BS Extremities: BLE dependent edema Dialysis Access: RIJ TDC drsg intact.     Additional Objective Labs: Basic Metabolic Panel: Recent Labs  Lab 12/14/20 0332 12/15/20 0825  NA 149* 139  K 6.2* 3.6  CL 111 102  CO2 26 25  GLUCOSE 119* 69*  BUN 111* 43*  CREATININE 16.40* 8.72*  CALCIUM 8.6* 7.9*  PHOS  --  3.4   Liver Function Tests: Recent Labs  Lab 12/15/20 0825  ALBUMIN 2.2*   No results for input(s): LIPASE, AMYLASE in the last 168 hours. CBC: Recent Labs  Lab 12/15/20 0500 12/16/20 0203  WBC 8.5 10.9*  HGB 8.4* 10.0*  HCT 27.3* 30.9*  MCV 82.2 80.1  PLT 185 199   Blood Culture    Component Value Date/Time   SDES BLOOD LEFT ANTECUBITAL 11/09/2020 1734   SPECREQUEST  11/09/2020 1734    BOTTLES DRAWN AEROBIC AND ANAEROBIC Blood Culture adequate volume   CULT  11/09/2020 1734    NO GROWTH 5 DAYS Performed at [facility], [place] [address], [place], [place] [address]    REPTSTATUS 11/14/2020 FINAL 11/09/2020 1734    Cardiac Enzymes: No results for input(s): CKTOTAL, CKMB, CKMBINDEX, TROPONINI in the last 168 hours. CBG: Recent Labs  Lab 12/15/20 2133 12/15/20 2359 12/16/20 0009 12/16/20 0830 12/16/20 0914  GLUCAP 53* 91 84 69* 121*   Iron Studies: No results for input(s): IRON, TIBC, TRANSFERRIN,  FERRITIN in the last 72 hours. @lablastinr3 @ Studies/Results: No results found. Medications:  heparin 800 Units/hr (12/16/20 1058)    Chlorhexidine Gluconate Cloth  6 each Topical Q0600   darbepoetin (ARANESP) injection - DIALYSIS  200 mcg Intravenous Q Fri-HD   sodium chloride flush  3 mL Intravenous Q12H     Dialysis Orders:  Former orders:  [place]  on TTS . 180NRe, 4 hours, BFR 400, DFR 500, EDW 77kg 4K, 2.25Ca, TDC Heparin 6000 unit bolus Mircera 224mcg IV q 2 weeks- last dose 10/31/20 [place]ol 103mcg IV q HD       Assessment/Plan: Hypoxia: CT angio of the chest showed left lower segmental lobe embolus.  Restarted on heparin.  She did have a PE during last admission but was refusing oral Eliquis.  We will attempt to optimize volume status with dialysis.  Management per primary team.  ESRD: Family transition to comfort care after last admission, then decided to restart dialysis.  Patient last had 1 hour of dialysis on 12/09/2020 as an OP.   Potassium, BUN and creatinine were all significantly elevated.  Had 2-hour dialysis treatment 11/24,  HD again 11/25.  did have significant agitation last admission, will need to be able to tolerate dialysis here without restraints before she is appropriate for  outpatient. Will order HD 12/18/2020 then can resume T,Th,S Schedule on Thursday hopefully as an OP.    Hypertension/volume: BP variable, soft to mildly elevated.  Does not appear to have significant volume overload on chest CT or exam.   Anemia: hgb 8.4-  resumed ESA  Metabolic bone disease: Corrected calc and phos OK-  no meds   Nutrition: We will check albumin with next labs. Dispo: Patient was agitated and refused p.o. medications during last admission.  Her mental status seems to be the same as last admission at this time.  She was made DNR and transition to hospice however patient's family revoked this and made her full code shortly after.  Recommend ongoing palliative care discussions  but need to continue HD for now-  needs sitter with OP HD -  that was the agreed upon plan    Rita H. Brown NP-C 12/16/2020, 11:41 AM  [facility] [phone]  Patient seen and examined, agree with above note with above modifications. Was able to do HD yesterday without too much drama-  trying to figure out anticoagulation piece.  As far as HD goes-   family is insistent that HD continue- we have just put stipulation that she needs to have a sitter accompany her to OP HD and we will perform HD as we can without doing harm -  next HD on Monday  Corliss Parish, MD 12/16/2020

## 2020-12-16 NOTE — Progress Notes (Signed)
ANTICOAGULATION CONSULT NOTE  Pharmacy Consult for heparin Indication: pulmonary embolus  Allergies  Allergen Reactions   Morphine And Related Other (See Comments)    Family request not to be given, reports pt does not wake up when given    Gabapentin Other (See Comments)    Pt is hard to wake up when this is given per sister    Oxycodone Other (See Comments)    Pt is hard to wake up when this is given per sister   Promethazine Hcl Other (See Comments)    lethargy    Patient Measurements: Height: 5\' 4"  (162.6 cm) Weight: 73.9 kg (162 lb 14.7 oz) IBW/kg (Calculated) : 54.7 Heparin Dosing Weight: 70kg  Vital Signs: Temp: 98 F (36.7 C) (11/26 0353) Temp Source: Axillary (11/26 0353) BP: 129/55 (11/26 0353) Pulse Rate: 79 (11/26 0353)  Labs: Recent Labs    12/13/20 1602 12/14/20 0332 12/14/20 0830 12/15/20 0500 12/15/20 0825 12/15/20 1442 12/15/20 2118 12/16/20 0203  HGB  --   --   --  8.4*  --   --   --  10.0*  HCT  --   --   --  27.3*  --   --   --  30.9*  PLT  --   --   --  185  --   --   --  199  HEPARINUNFRC  --   --    < >  --   --  1.08* 0.79* 0.62  CREATININE  --  16.40*  --   --  8.72*  --   --   --   TROPONINIHS 31*  --   --   --   --   --   --   --    < > = values in this interval not displayed.     Estimated Creatinine Clearance: 5.5 mL/min (A) (by C-G formula based on SCr of 8.72 mg/dL (H)).   Assessment: 75yo female with ESRD HD TTS, recently admitted 11/09/20-11/23/20 for hypoxia 2/2 acute left lower lobe PE. At that time the patient was refusing oral medications, so was switched to hospice care with DNR status. Sister thought patient was improving, so DNR status was reversed to full code. Patient readmitted with hypoxia as she has advanced dementia and is continuing refusal of oral medications. Pharmacy has been consulted for heparin dosing.   Heparin level is therapeutic at 0.62 with heparin running at 800 units/h.  Previous levels elevated, but  possibly as a result of contamination as level from 11/24 was therapeutic with heparin running at 900 units/hour, which is consistent with most recent level. Heparin levels being drawn from the same arm, but distal to the IV site as other arm is restricted and unable to do footstick as the patient is diabetic. H/H stable, PLT WNL.   Goal of Therapy:  Heparin level 0.3-0.7 units/ml Monitor platelets by anticoagulation protocol: Yes   Plan:  CONTINUE heparin infusion at 800 units/hr F/U daily heparin level, CBC Monitor for signs and symptoms of bleeding   Adria Dill, PharmD PGY-1 Acute Care Resident  12/16/2020 7:58 AM

## 2020-12-16 NOTE — Progress Notes (Signed)
CBG 69, 4 oz of gingerale given due to ESRD per protocol but patient not following commands to swallow and drink entire 4 oz. Protocol orders obtained for dextrose IV, see MAR. Recheck of capillary blood glucose after 15 minutes post IV dextrose admin is 121.

## 2020-12-16 NOTE — Plan of Care (Signed)
  Problem: Education: Goal: Knowledge of General Education information will improve Description: Including pain rating scale, medication(s)/side effects and non-pharmacologic comfort measures Outcome: Progressing   Problem: Clinical Measurements: Goal: Will remain free from infection Outcome: Progressing   Problem: Nutrition: Goal: Adequate nutrition will be maintained Outcome: Progressing   Problem: Skin Integrity: Goal: Risk for impaired skin integrity will decrease Outcome: Progressing

## 2020-12-16 NOTE — Progress Notes (Signed)
PROGRESS NOTE  Joann Gutierrez  DOB: 11-09-1945  PCP: Andree Moro, DO UKG:[id number]  DOA: 12/13/2020  LOS: 2 days  Hospital Day: 4  Chief Complaint  Patient presents with   Hypoxia    Brief narrative: Joann Gutierrez is a 75 y.o. female with PMH significant for advanced dementia who is nonverbal and wheelchair-bound, ESRD on TTS HD, COPD, OSA, history of PE not currently on anticoagulation. Patient was brought to the ED on 7/23 from [facility] for evaluation of hypoxia. Patient was recently admitted 11/09/2020-11/23/2020 for hypoxia secondary to acute left upper lobe PE.  She was initially on IV heparin and transition to oral Eliquis however patient began to refuse oral medications. Per discharge summary, extensive goals of care discussions with palliative care, nephrology, primary team, and family resulted in transition to DNR/DNI status and patient has discharged to home with hospice care. Patient's sister states that while at home patient appeared to be improving and more interactive. Family subsequently reversed her hospice and restarted dialysis. On 11/23, she went for follow-up with her PCP.  She was noted to have tachycardia and low oxygen level and hence sent to the ED for further evaluation.  She underwent a CT angiogram chest which revealed pulmonary embolism.  Patient was hospitalized for further management.  Subjective: Patient noted to be in mittens.  Pleasantly confused.  Denies any chest pain.  Does not appear to be in any discomfort.    Assessment/Plan:  Acute pulmonary embolism -Left lower lobe segmental PE seen on CTA chest without findings of right heart strain or pulmonary infarction.   Her last hospitalization was for left upper lobe pulm embolism.  Patient was initially on IV heparin at that admission.  She was transitioned to Eliquis but her oral intake was noted to be very poor.  She was not swallowing her tablets.  So she was switched over to  Lovenox.  She was subsequently transitioned to hospice and so she was not discharged on any anticoagulation.  Currently patient is on IV heparin.  Patient noted to be hemodynamically stable.  We will see how she does with oral intake during this admission.  If she swallows tablets and is able to eat and drink then may not be unreasonable to switch her to Eliquis.     Acute respiratory failure with hypoxia Currently requiring oxygen at 2 L/min.  Saturations in the 90s.  Respiratory status seems to be stable   ESRD HD TTS Nephrology following.  Dialysis was stopped when she was discharged during last admission after she was transitioned to hospice.  Patient's family reversed the decision and she is back on hemodialysis now.  Chronic diastolic CHF Volume management with dialysis.  Normocytic anemia Hemoglobin stable.  No evidence of overt bleeding.  Continue to monitor.  Hypoglycemia This was she will during last admission as well.  Likely due to poor oral intake.  We will stop her sliding scale coverage.  Encourage oral intake.   Goals of care: Admitting MD had a long discussion with patient's sister over the phone regarding patient's chronic incurable medical conditions and that she would be unlikely to benefit from any aggressive intervention in the setting of cardiac failure or CODE BLUE event and that these would likely cause more suffering and worsening quality of life.  Per note, sister stated that they have revoked hospice care and reversed CODE STATUS to full code.  Would benefit from further palliative care discussions going forward.  Waiting on palliative  medicine input.   COPD: Continue albuterol as needed.   OSA: CPAP nightly.   Advanced dementia with behavioral disturbance: Appears to be at baseline nonverbal status.  Continue Haldol as needed and delirium precautions.  DVT prophylaxis: Currently on heparin infusion CODE STATUS: Full code Family communication: No family at  bedside Disposition: Probable return home when medically stable.  Status is: Inpatient  Remains inpatient appropriate because: Acute pulmonary embolism, acute respiratory failure with hypoxia    Consultants: Nephrology    Infusions:   heparin 800 Units/hr (12/15/20 1704)    Scheduled Meds:  Chlorhexidine Gluconate Cloth  6 each Topical Q0600   darbepoetin (ARANESP) injection - DIALYSIS  200 mcg Intravenous Q Fri-HD   insulin aspart  0-5 Units Subcutaneous QHS   insulin aspart  0-9 Units Subcutaneous TID WC   sodium chloride flush  3 mL Intravenous Q12H    PRN meds: acetaminophen **OR** acetaminophen, albuterol, haloperidol, ondansetron **OR** ondansetron (ZOFRAN) IV   Antimicrobials: Anti-infectives (From admission, onward)    None       Objective: Vitals:   12/15/20 2002 12/16/20 0353  BP: (!) 148/63 (!) 129/55  Pulse: 96 79  Resp: 16 16  Temp: 99.5 F (37.5 C) 98 F (36.7 C)  SpO2: 100% 100%    Intake/Output Summary (Last 24 hours) at 12/16/2020 0826 Last data filed at 12/16/2020 0300 Gross per 24 hour  Intake 183.22 ml  Output 1500 ml  Net -1316.78 ml    Filed Weights   12/15/20 0813 12/15/20 1120 12/16/20 0458  Weight: 74 kg 72.5 kg 73.9 kg   Weight change: -2.7 kg Body mass index is 27.97 kg/m.   Physical Exam:  General appearance: Awake alert.  In no distress.  Pleasantly confused Resp: Clear to auscultation bilaterally.  Normal effort Cardio: S1-S2 is normal regular.  No S3-S4.  No rubs murmurs or bruit GI: Abdomen is soft.  Nontender nondistended.  Bowel sounds are present normal.  No masses organomegaly Extremities: No edema.   Neurologic:   No focal neurological deficits.     Data Review: I have personally reviewed the laboratory data and studies available.  F/u labs ordered Unresulted Labs (From admission, onward)     Start     Ordered   12/15/20 0500  CBC  Daily,   R      12/14/20 1232   12/15/20 0500  Heparin level  (unfractionated)  Daily,   R      12/14/20 1240            Signed, Bonnielee Haff, MD [facility] 12/16/2020

## 2020-12-17 DIAGNOSIS — D649 Anemia, unspecified: Secondary | ICD-10-CM | POA: Diagnosis not present

## 2020-12-17 DIAGNOSIS — I2699 Other pulmonary embolism without acute cor pulmonale: Secondary | ICD-10-CM | POA: Diagnosis not present

## 2020-12-17 DIAGNOSIS — R0902 Hypoxemia: Secondary | ICD-10-CM | POA: Diagnosis not present

## 2020-12-17 DIAGNOSIS — N186 End stage renal disease: Secondary | ICD-10-CM | POA: Diagnosis not present

## 2020-12-17 DIAGNOSIS — G4733 Obstructive sleep apnea (adult) (pediatric): Secondary | ICD-10-CM

## 2020-12-17 DIAGNOSIS — F03918 Unspecified dementia, unspecified severity, with other behavioral disturbance: Secondary | ICD-10-CM | POA: Diagnosis not present

## 2020-12-17 LAB — CBC
HCT: 27.2 % — ABNORMAL LOW (ref 36.0–46.0)
Hemoglobin: 9 g/dL — ABNORMAL LOW (ref 12.0–15.0)
MCH: 26.2 pg (ref 26.0–34.0)
MCHC: 33.1 g/dL (ref 30.0–36.0)
MCV: 79.3 fL — ABNORMAL LOW (ref 80.0–100.0)
Platelets: 187 10*3/uL (ref 150–400)
RBC: 3.43 MIL/uL — ABNORMAL LOW (ref 3.87–5.11)
RDW: 20.6 % — ABNORMAL HIGH (ref 11.5–15.5)
WBC: 11.1 10*3/uL — ABNORMAL HIGH (ref 4.0–10.5)
nRBC: 0 % (ref 0.0–0.2)

## 2020-12-17 LAB — GLUCOSE, CAPILLARY
Glucose-Capillary: 146 mg/dL — ABNORMAL HIGH (ref 70–99)
Glucose-Capillary: 155 mg/dL — ABNORMAL HIGH (ref 70–99)
Glucose-Capillary: 202 mg/dL — ABNORMAL HIGH (ref 70–99)
Glucose-Capillary: 137 mg/dL — ABNORMAL HIGH (ref 70–99)

## 2020-12-17 LAB — HEPARIN LEVEL (UNFRACTIONATED): Heparin Unfractionated: 0.32 IU/mL (ref 0.30–0.70)

## 2020-12-17 NOTE — Progress Notes (Signed)
PROGRESS NOTE  Joann Gutierrez  DOB: Aug 01, 1945  PCP: Andree Moro, DO BMW:[id number]  DOA: 12/13/2020  LOS: 3 days  Hospital Day: 5  Chief Complaint  Patient presents with   Hypoxia    Brief narrative: Joann Gutierrez is a 75 y.o. female with PMH significant for advanced dementia who is nonverbal and wheelchair-bound, ESRD on TTS HD, COPD, OSA, history of PE not currently on anticoagulation. Patient was brought to the ED on 7/23 from [facility] for evaluation of hypoxia. Patient was recently admitted 11/09/2020-11/23/2020 for hypoxia secondary to acute left upper lobe PE.  She was initially on IV heparin and transition to oral Eliquis however patient began to refuse oral medications. Per discharge summary, extensive goals of care discussions with palliative care, nephrology, primary team, and family resulted in transition to DNR/DNI status and patient has discharged to home with hospice care. Patient's sister states that while at home patient appeared to be improving and more interactive. Family subsequently reversed her hospice and restarted dialysis. On 11/23, she went for follow-up with her PCP.  She was noted to have tachycardia and low oxygen level and hence sent to the ED for further evaluation.  She underwent a CT angiogram chest which revealed pulmonary embolism.  Patient was hospitalized for further management.  Subjective: Patient noted to be pleasantly confused.  Does not appear to be in any discomfort or distress.  Does not answer all questions.  Denies any pain.  Assessment/Plan:  Acute pulmonary embolism -Left lower lobe segmental PE seen on CTA chest without findings of right heart strain or pulmonary infarction.   Her last hospitalization was for left upper lobe pulm embolism.  Patient was initially on IV heparin at that admission.  She was transitioned to Eliquis but her oral intake was noted to be very poor.  She was not swallowing her tablets.  So she was  switched over to Lovenox.  She was subsequently transitioned to hospice and so she was not discharged on any anticoagulation.  Currently patient is on IV heparin.  Patient noted to be hemodynamically stable.   Long discussion with patient's son who is the decision maker for her.  He mentions that patient's oral intake is much better at home compared to the hospital.   We did discuss anticoagulation.  I told him that in patients on dialysis Lovenox is not an option.  Since her oral intake is better at home and she does take her medications at home it will not be unreasonable to transition her to Eliquis tomorrow after dialysis and let her go home.  He is agreeable to this plan.   Hopefully will be able to wean her off of oxygen by then.  If not she should home oxygen will be ordered    Acute respiratory failure with hypoxia Currently requiring oxygen at 2 L/min.  Saturations are in the 90s.  Respiratory status is stable.  Continue to wean down as tolerated.  Maintain saturations greater than 90%  ESRD HD TTS Nephrology following.  Dialysis was stopped when she was discharged during last admission after she was transitioned to hospice.  Patient's family reversed the decision and she is back on hemodialysis now. To be dialyzed tomorrow.  Chronic diastolic CHF Volume management with dialysis.  Normocytic anemia Hemoglobin stable.  No evidence of overt bleeding.  Continue to monitor.  Hypoglycemia This was she will during last admission as well.  Likely due to poor oral intake.  We will stop her  sliding scale coverage.  Encourage oral intake.   Goals of care: Admitting MD had a long discussion with patient's sister over the phone regarding patient's chronic incurable medical conditions and that she would be unlikely to benefit from any aggressive intervention in the setting of cardiac failure or CODE BLUE event and that these would likely cause more suffering and worsening quality of life.  Per  note, sister stated that they have revoked hospice care and reversed CODE STATUS to full code.  Seen by palliative care.  They have discussed with patient's son who wants to continue full scope of care for now.  I also discussed with patient's son today as discussed above.   COPD: Continue albuterol as needed.   OSA: CPAP nightly.   Advanced dementia with behavioral disturbance: Appears to be at baseline status.  Continue Haldol as needed and delirium precautions.  DVT prophylaxis: Currently on heparin infusion CODE STATUS: Full code Family communication: No family at bedside Disposition: Probable return home when medically stable.  Status is: Inpatient  Remains inpatient appropriate because: Acute pulmonary embolism, acute respiratory failure with hypoxia    Consultants: Nephrology    Infusions:   heparin 850 Units/hr (12/17/20 0910)    Scheduled Meds:  Chlorhexidine Gluconate Cloth  6 each Topical Q0600   darbepoetin (ARANESP) injection - DIALYSIS  200 mcg Intravenous Q Fri-HD   feeding supplement  237 mL Oral TID BM   sodium chloride flush  3 mL Intravenous Q12H    PRN meds: acetaminophen **OR** acetaminophen, albuterol, haloperidol, ondansetron **OR** ondansetron (ZOFRAN) IV   Antimicrobials: Anti-infectives (From admission, onward)    None       Objective: Vitals:   12/17/20 0407 12/17/20 0831  BP: (!) 147/61 (!) 142/66  Pulse: 92 98  Resp: 15 16  Temp: 98.6 F (37 C) (!) 97.5 F (36.4 C)  SpO2: 98% 100%    Intake/Output Summary (Last 24 hours) at 12/17/2020 1030 Last data filed at 12/17/2020 0300 Gross per 24 hour  Intake 186.1 ml  Output --  Net 186.1 ml    Filed Weights   12/15/20 1120 12/16/20 0458 12/17/20 0456  Weight: 72.5 kg 73.9 kg 73.2 kg   Weight change: -0.8 kg Body mass index is 27.7 kg/m.   Physical Exam:  Awake alert.  In no distress.  Distracted. Lungs are clear to auscultation bilaterally. S1-S2 is normal  regular. Abdomen is soft.  Nontender nondistended. No obvious focal neurological deficits.    Data Review: I have personally reviewed the laboratory data and studies available.  F/u labs ordered Unresulted Labs (From admission, onward)     Start     Ordered   12/15/20 0500  CBC  Daily,   R      12/14/20 1232   12/15/20 0500  Heparin level (unfractionated)  Daily,   R      12/14/20 1240            Signed, Bonnielee Haff, MD [facility] 12/17/2020

## 2020-12-17 NOTE — Progress Notes (Signed)
   Palliative Medicine Inpatient Follow Up Note   HPI: Rocklyn Mayberry is a 75 year old female with significant PMH of advanced demential and is wheelchair bound, ESRD on TTSHD, COPD, history of PE not currently on anticoagulation, OSA admitted for evaluation of hypoxia, diagnosed with left lower lobe segmental PE without findings of right heart strain or pulmonary infarction. She had recent admission 11/09/2020- 11/23/2020 for hypoxia secondary to left upper lobe PE, Initially on IV heparin and transition to oral Eliquis however she began to refuse oral medication. At that time there were extensive discussions on goals of care and she was transitioned to DNR-DNI status and discharged to home with hospice. She improved at home and family withdrew hospice and DNR status. She had stated dialysis again. She is now on IV heparin with goal to transition to Eliquis.   Today's Discussion comfort: Today, Ms. Mohamed is more interactive. She is responding verbally. She is oriented to self only. She denies pain. She seems comfortable. Respirations are not labored.   Objective Assessment: Vital Signs Vitals:   12/17/20 0831 12/17/20 1200  BP: (!) 142/66 (!) 128/109  Pulse: 98 (!) 104  Resp: 16 17  Temp: (!) 97.5 F (36.4 C) 98.1 F (36.7 C)  SpO2: 100%     Intake/Output Summary (Last 24 hours) at 12/17/2020 1338 Last data filed at 12/17/2020 0300 Gross per 24 hour  Intake 186.1 ml  Output --  Net 186.1 ml   Last Weight  Most recent update: 12/17/2020  4:57 AM    Weight  73.2 kg (161 lb 6 oz)            SUMMARY OF RECOMMENDATIONS   Code Status/Advance Care Planning: FULL CODE   Symptom Management:  Pain- acetaminophen prn (has not required) Breathlessness, O2 at 2 lpm, weaning to keep O2 sats> 90, CPAP,  albuterol prn Agitation- haldol prn (has not required) Nausea- ondansetron prn (has not required)     Additional Recommendations (Limitations, Scope, Preferences): PM to  shadow chart for symptom management and continued [place] discussions if condition changes. Please reach out of change in status would benefit from further conversations.   Discharge Planning: Transition to Eliquis after dialysis Monday. Plans for discharge to home with family with plans for T, Th, S outpatient dialysis schedule. Consider outpatient palliative medicine referral with discharge for continued discussions.   Time In: 9:15-9:30 Time Spent: 15 minutes Greater than 50% of the time was spent in counseling and coordination of care ______________________________________________________________________________________ [place] Team Team Cell Phone: [phone] Please utilize secure chat with additional questions, if there is no response within 30 minutes please call the above phone number  Palliative Medicine Team providers are available by phone from 7am to 7pm daily and can be reached through the team cell phone.  Should this patient require assistance outside of these hours, please call the patient's attending physician.

## 2020-12-17 NOTE — Final Progress Note (Signed)
Patient wearing mittens and is confused unable to remove bipap on her own if needed.

## 2020-12-17 NOTE — Plan of Care (Signed)

## 2020-12-17 NOTE — Progress Notes (Signed)
ANTICOAGULATION CONSULT NOTE  Pharmacy Consult for heparin Indication: pulmonary embolus  Allergies  Allergen Reactions   Morphine And Related Other (See Comments)    Family request not to be given, reports pt does not wake up when given    Gabapentin Other (See Comments)    Pt is hard to wake up when this is given per sister    Oxycodone Other (See Comments)    Pt is hard to wake up when this is given per sister   Promethazine Hcl Other (See Comments)    lethargy    Patient Measurements: Height: 5\' 4"  (162.6 cm) Weight: 73.2 kg (161 lb 6 oz) IBW/kg (Calculated) : 54.7 Heparin Dosing Weight: 70kg  Vital Signs: Temp: 98.6 F (37 C) (11/27 0407) Temp Source: Axillary (11/27 0407) BP: 147/61 (11/27 0407) Pulse Rate: 92 (11/27 0407)  Labs: Recent Labs    12/15/20 0500 12/15/20 0825 12/15/20 1442 12/15/20 2118 12/16/20 0203 12/17/20 0102  HGB 8.4*  --   --   --  10.0* 9.0*  HCT 27.3*  --   --   --  30.9* 27.2*  PLT 185  --   --   --  199 187  HEPARINUNFRC  --   --    < > 0.79* 0.62 0.32  CREATININE  --  8.72*  --   --   --   --    < > = values in this interval not displayed.     Estimated Creatinine Clearance: 5.5 mL/min (A) (by C-G formula based on SCr of 8.72 mg/dL (H)).   Assessment: 75yo female with ESRD HD TTS, recently admitted 11/09/20-11/23/20 for hypoxia 2/2 acute left lower lobe PE. At that time the patient was refusing oral medications, so was switched to hospice care with DNR status. Sister thought patient was improving, so DNR status was reversed to full code. Patient readmitted with hypoxia as she has advanced dementia and is continuing refusal of oral medications. Pharmacy has been consulted for heparin dosing.   Heparin level is therapeutic at 0.32 with heparin running at 800 units/h. Heparin levels being drawn from the same arm, but distal to the IV site as other arm is restricted and unable to do footstick as the patient is diabetic. Slight Hgb  decrease from 10>9, patient received HD 11/26. No overt signs of bleeding noted. Will empirically increase heparin dose as level trended down from 0.62>0.32 and is on the low end of therapeutic.   Goal of Therapy:  Heparin level 0.3-0.7 units/ml Monitor platelets by anticoagulation protocol: Yes   Plan:  Increase heparin infusion to 850 units/hr F/U daily heparin level, CBC Monitor for signs and symptoms of bleeding   Joann Gutierrez, PharmD PGY-1 Acute Care Resident  12/17/2020 7:41 AM
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
## 2020-12-18 DIAGNOSIS — I2699 Other pulmonary embolism without acute cor pulmonale: Secondary | ICD-10-CM | POA: Diagnosis not present

## 2020-12-18 LAB — GLUCOSE, CAPILLARY
Glucose-Capillary: 111 mg/dL — ABNORMAL HIGH (ref 70–99)
Glucose-Capillary: 97 mg/dL (ref 70–99)

## 2020-12-18 LAB — CBC
HCT: 26 % — ABNORMAL LOW (ref 36.0–46.0)
Hemoglobin: 8.7 g/dL — ABNORMAL LOW (ref 12.0–15.0)
MCH: 26.6 pg (ref 26.0–34.0)
MCHC: 33.5 g/dL (ref 30.0–36.0)
MCV: 79.5 fL — ABNORMAL LOW (ref 80.0–100.0)
Platelets: 181 10*3/uL (ref 150–400)
RBC: 3.27 MIL/uL — ABNORMAL LOW (ref 3.87–5.11)
RDW: 20.2 % — ABNORMAL HIGH (ref 11.5–15.5)
WBC: 15.3 10*3/uL — ABNORMAL HIGH (ref 4.0–10.5)
nRBC: 0 % (ref 0.0–0.2)

## 2020-12-18 LAB — HEPARIN LEVEL (UNFRACTIONATED): Heparin Unfractionated: 0.5 IU/mL (ref 0.30–0.70)

## 2020-12-18 MED ORDER — APIXABAN 5 MG PO TABS: 5.0000 mg | ORAL_TABLET | Freq: Two times a day (BID) | ORAL | 2 refills | Status: DC

## 2020-12-18 MED ORDER — APIXABAN (ELIQUIS) VTE STARTER PACK (10MG AND 5MG): ORAL_TABLET | ORAL | 0 refills | Status: DC

## 2020-12-18 MED ORDER — APIXABAN 5 MG PO TABS
10.0000 mg | ORAL_TABLET | Freq: Two times a day (BID) | ORAL | Status: DC
  Filled 2020-12-18: qty 2

## 2020-12-18 MED ORDER — HEPARIN (PORCINE) 25000 UT/250ML-% IV SOLN
850.0000 [IU]/h | INTRAVENOUS | Status: DC
Start: 2020-12-18 — End: 2020-12-18
  Filled 2020-12-18: qty 250

## 2020-12-18 MED ORDER — COLLAGENASE 250 UNIT/GM EX OINT
TOPICAL_OINTMENT | Freq: Every day | CUTANEOUS | Status: DC
  Filled 2020-12-18: qty 30

## 2020-12-18 MED ORDER — APIXABAN 5 MG PO TABS: 5.0000 mg | ORAL_TABLET | Freq: Two times a day (BID) | ORAL | Status: DC

## 2020-12-18 NOTE — TOC Transition Note (Signed)
Transition of Care [facility]) - CM/SW Discharge Note   Patient Details  Name: Joann Gutierrez MRN: [id number] Date of Birth: 1945/12/11  [facility] CM/SW Contact:  Angelita Ingles, RN Phone [phone]  12/18/2020, 1:38 PM   Clinical Narrative:    [facility] consulted for patient discharging home with transportation needs. Patient has advanced dementia and is total care unable to safely transport via private vehicle. CM has arranged discharge transport via [facility]. address has been verified with son [name]. No other needs noted at this time. TOC will sign off.          Patient Goals and CMS Choice        Discharge Placement                       Discharge Plan and Services                                     Social Determinants of Health (SDOH) Interventions     Readmission Risk Interventions No flowsheet data found.

## 2020-12-18 NOTE — Discharge Summary (Addendum)
[facility]  Physician Discharge Summary   Patient ID: Joann Gutierrez MRN: [id number] DOB/AGE: 04-07-1945 75 y.o.  Admit date: 12/13/2020 Discharge date:   12/18/2020   PCP: Andree Moro, DO  DISCHARGE DIAGNOSES:  Acute pulmonary embolism Acute respiratory failure with hypoxia, resolved End-stage renal disease on hemodialysis Chronic diastolic CHF Normocytic anemia    RECOMMENDATIONS FOR OUTPATIENT FOLLOW UP: Resume usual outpatient dialysis schedule    Home Health: None Equipment/Devices: None  CODE STATUS: Full code  DISCHARGE CONDITION: fair  Diet recommendation: As before  INITIAL HISTORY: Joann Gutierrez is a 75 y.o. female with PMH significant for advanced dementia who is nonverbal and wheelchair-bound, ESRD on TTS HD, COPD, OSA, history of PE not currently on anticoagulation. Patient was brought to the ED on 7/23 from [facility] for evaluation of hypoxia. Patient was recently admitted 11/09/2020-11/23/2020 for hypoxia secondary to acute left upper lobe PE.  She was initially on IV heparin and transition to oral Eliquis however patient began to refuse oral medications. Per discharge summary, extensive goals of care discussions with palliative care, nephrology, primary team, and family resulted in transition to DNR/DNI status and patient has discharged to home with hospice care. Patient's sister states that while at home patient appeared to be improving and more interactive. Family subsequently reversed her hospice and restarted dialysis. On 11/23, she went for follow-up with her PCP.  She was noted to have tachycardia and low oxygen level and hence sent to the ED for further evaluation.  She underwent a CT angiogram chest which revealed pulmonary embolism.  Patient was hospitalized for further management.    Consultations: Nephrology Palliative care  Procedures: Hemodialysis    HOSPITAL COURSE:   Acute pulmonary embolism Left lower  lobe segmental PE seen on CTA chest without findings of right heart strain or pulmonary infarction.   Her last hospitalization was for left upper lobe pulm embolism.  Patient was initially on IV heparin at that admission.  She was transitioned to Eliquis but her oral intake was noted to be very poor.  She was not swallowing her tablets.  So she was switched over to Lovenox.  She was subsequently transitioned to hospice and so she was not discharged on any anticoagulation.  Patient hospitalized with hypoxia.  Started on IV heparin.  Remains hemodynamically stable. Long discussion with patient's son who is the decision maker for her.  He mentions that patient's oral intake is much better at home compared to the hospital.   We did discuss anticoagulation.  I told him that in patients on dialysis Lovenox is not an option.  Since her oral intake is better at home and she does take her medications at home it was felt reasonable to transition patient to Eliquis.  Son is agreeable with this plan.  We will send prescriptions for Eliquis to her pharmacy.  She has been weaned off of oxygen and is stable from a respiratory standpoint.     Acute respiratory failure with hypoxia Resolved.  Currently saturating normal on room air.   ESRD HD TTS Dialysis was stopped when she was discharged during last admission after she was transitioned to hospice.  Patient's family reversed the decision and she is back on hemodialysis now.  She was dialyzed here in the hospital.  Son mentioned that they will be able to have somebody sit with the patient while she is undergoing outpatient dialysis.   Chronic diastolic CHF Volume management with dialysis.  Normocytic anemia Hemoglobin stable.  No evidence of overt bleeding.  Continue to monitor.  Hypoglycemia Likely due to poor oral intake.  CBGs have stabilized in the last 24 hours   Goals of care: Admitting MD had a long discussion with patient's sister over the phone  regarding patient's chronic incurable medical conditions and that she would be unlikely to benefit from any aggressive intervention in the setting of cardiac failure or CODE BLUE event and that these would likely cause more suffering and worsening quality of life.  Per note, sister stated that they have revoked hospice care and reversed CODE STATUS to full code.  Seen by palliative care.  They have discussed with patient's son who wants to continue full scope of care for now.  Despite family's desire to continue with full scope of care it does not change the fact that patient has guarded to poor prognosis.   COPD: Continue albuterol as needed.   OSA: CPAP nightly.   Advanced dementia with behavioral disturbance: Appears to be at baseline status.    Overall stable.  Discussed with patient and son today.  He is requesting transportation by EMS/ambulance.  Okay for discharge home with family.  PERTINENT LABS:  The results of significant diagnostics from this hospitalization (including imaging, microbiology, ancillary and laboratory) are listed below for reference.    Microbiology: Recent Results (from the past 240 hour(s))  Resp Panel by RT-PCR (Flu A&B, Covid) Nasopharyngeal Swab     Status: None   Collection Time: 12/13/20  2:02 PM   Specimen: Nasopharyngeal Swab; Nasopharyngeal(NP) swabs in vial transport medium  Result Value Ref Range Status   SARS Coronavirus 2 by RT PCR NEGATIVE NEGATIVE Final    Comment: (NOTE) SARS-CoV-2 target nucleic acids are NOT DETECTED.  The SARS-CoV-2 RNA is generally detectable in upper respiratory specimens during the acute phase of infection. The lowest concentration of SARS-CoV-2 viral copies this assay can detect is 138 copies/mL. A negative result does not preclude SARS-Cov-2 infection and should not be used as the sole basis for treatment or other patient management decisions. A negative result may occur with  improper specimen collection/handling,  submission of specimen other than nasopharyngeal swab, presence of viral mutation(s) within the areas targeted by this assay, and inadequate number of viral copies(<138 copies/mL). A negative result must be combined with clinical observations, patient history, and epidemiological information. The expected result is Negative.  Fact Sheet for Patients:  EntrepreneurPulse.com.au  Fact Sheet for Healthcare Providers:  IncredibleEmployment.be  This test is no t yet approved or cleared by the [place] FDA and  has been authorized for detection and/or diagnosis of SARS-CoV-2 by FDA under an Emergency Use Authorization (EUA). This EUA will remain  in effect (meaning this test can be used) for the duration of the COVID-19 declaration under Section 564(b)(1) of the Act, 21 U.S.C.section 360bbb-3(b)(1), unless the authorization is terminated  or revoked sooner.       Influenza A by PCR NEGATIVE NEGATIVE Final   Influenza B by PCR NEGATIVE NEGATIVE Final    Comment: (NOTE) The Xpert Xpress SARS-CoV-2/FLU/RSV plus assay is intended as an aid in the diagnosis of influenza from Nasopharyngeal swab specimens and should not be used as a sole basis for treatment. Nasal washings and aspirates are unacceptable for Xpert Xpress SARS-CoV-2/FLU/RSV testing.  Fact Sheet for Patients: EntrepreneurPulse.com.au  Fact Sheet for Healthcare Providers: IncredibleEmployment.be  This test is not yet approved or cleared by the [place] FDA and has been authorized for detection and/or diagnosis of  SARS-CoV-2 by FDA under an Emergency Use Authorization (EUA). This EUA will remain in effect (meaning this test can be used) for the duration of the COVID-19 declaration under Section 564(b)(1) of the Act, 21 U.S.C. section 360bbb-3(b)(1), unless the authorization is terminated or revoked.  Performed at [facility], [place] [address] [place], [place] [address]      Labs:  COVID-19 Labs   Lab Results  Component Value Date   SARSCOV2NAA NEGATIVE 12/13/2020   [place] NEGATIVE 11/09/2020   [place] NEGATIVE 06/20/2020   [place] NEGATIVE 04/06/2020      Basic Metabolic Panel: Recent Labs  Lab 12/14/20 0332 12/15/20 0825  NA 149* 139  K 6.2* 3.6  CL 111 102  CO2 26 25  GLUCOSE 119* 69*  BUN 111* 43*  CREATININE 16.40* 8.72*  CALCIUM 8.6* 7.9*  PHOS  --  3.4   Liver Function Tests: Recent Labs  Lab 12/15/20 0825  ALBUMIN 2.2*    CBC: Recent Labs  Lab 12/15/20 0500 12/16/20 0203 12/17/20 0102 12/18/20 0623  WBC 8.5 10.9* 11.1* 15.3*  HGB 8.4* 10.0* 9.0* 8.7*  HCT 27.3* 30.9* 27.2* 26.0*  MCV 82.2 80.1 79.3* 79.5*  PLT 185 199 187 181    BNP: BNP (last 3 results) Recent Labs    02/15/20 2044 12/13/20 1608  BNP 73.8 134.9*     CBG: Recent Labs  Lab 12/17/20 0829 12/17/20 1203 12/17/20 1617 12/17/20 2058 12/18/20 0800  GLUCAP 146* 155* 202* 137* 111*     IMAGING STUDIES CT Angio Chest PE W and/or Wo Contrast  Result Date: 12/14/2020 CLINICAL DATA:  found to have low oxygen and placed on 2L [place]. PE suspected, low/intermediate prob, positive D-dimer EXAM: CT ANGIOGRAPHY CHEST WITH CONTRAST TECHNIQUE: Multidetector CT imaging of the chest was performed using the standard protocol during bolus administration of intravenous contrast. Multiplanar CT image reconstructions and MIPs were obtained to evaluate the vascular anatomy. CONTRAST:  22mL OMNIPAQUE IOHEXOL 350 MG/ML SOLN COMPARISON:  None. FINDINGS: Lines and tubes: Dialysis catheter with tip terminating at the inferior cavoatrial junction. Cardiovascular: Satisfactory opacification of the pulmonary arteries to the segmental level. Left lower lobe segmental pulmonary embolus. Normal heart size. No pericardial effusion. Atherosclerotic plaque. At least right main coronary artery calcification. Incidentally noted  duplicated superior vena cava. Mediastinum/Nodes: No enlarged mediastinal, hilar, or axillary lymph nodes. Thyroid gland, trachea, and esophagus demonstrate no significant findings. Debris within the right mainstem bronchi. Elevated right hemidiaphragm. Lungs/Pleura: Trace consolidation with air bronchogram at the right base. No pulmonary nodule. No pulmonary mass. No pleural effusion. No pneumothorax. Upper Abdomen: No acute abnormality. Musculoskeletal: No chest wall abnormality. No suspicious lytic or blastic osseous lesions. No acute displaced fracture. Multilevel degenerative changes of the spine. Partially visualized right upper extremity with likely severe degenerative changes of the wrist. Review of the MIP images confirms the above findings. IMPRESSION: 1. Left lower lobe segmental pulmonary embolus. No findings of right heart strain or pulmonary infarction. 2. Debris within the right mainstem bronchi with trace consolidation at the right base which may be due to atelectasis in the setting of an elevated right hemidiaphragm versus developing infection/inflammation. 3. Dialysis catheter with tip terminating at the inferior cavoatrial junction. 4.  Aortic Atherosclerosis (ICD10-I70.0). These results were called by telephone at the time of interpretation on 12/14/2020 at 12:04 am to provider Dr. Wyvonnia Dusky, who verbally acknowledged these results. Electronically Signed   By: Iven Finn M.D.   On: 12/14/2020 00:15   DG  Chest Port 1 View  Result Date: 12/13/2020 CLINICAL DATA:  Hypoxia, short of breath EXAM: PORTABLE CHEST 1 VIEW COMPARISON:  11/12/2020 FINDINGS: Single frontal view of the chest demonstrates stable right internal jugular dialysis catheter. The cardiac silhouette is unremarkable. No airspace disease, effusion, or pneumothorax. Chronic elevation right hemidiaphragm. IMPRESSION: 1. No acute intrathoracic process. Electronically Signed   By: Randa Ngo M.D.   On: 12/13/2020 16:37     DISCHARGE EXAMINATION: Vitals:   12/17/20 2100 12/18/20 0000 12/18/20 0400 12/18/20 0759  BP: (!) 146/62 (!) 150/60 (!) 150/61 (!) 149/60  Pulse: 90 95 94 82  Resp: 14 13 12 12   Temp: 98.5 F (36.9 C) 98.3 F (36.8 C) 98.3 F (36.8 C) 98.6 F (37 C)  TempSrc: Axillary Axillary Axillary Axillary  SpO2: 100% 99%  99%  Weight:      Height:       General appearance: Awake alert.  In no distress.  Remains distracted Resp: Clear to auscultation bilaterally.  Normal effort Cardio: S1-S2 is normal regular.  No S3-S4.  No rubs murmurs or bruit GI: Abdomen is soft.  Nontender nondistended.  Bowel sounds are present normal.  No masses organomegaly    DISPOSITION: Home with family  Discharge Instructions     Call MD for:  difficulty breathing, headache or visual disturbances   Complete by: As directed    Call MD for:  extreme fatigue   Complete by: As directed    Call MD for:  persistant dizziness or light-headedness   Complete by: As directed    Call MD for:  persistant nausea and vomiting   Complete by: As directed    Call MD for:  severe uncontrolled pain   Complete by: As directed    Call MD for:  temperature >100.4   Complete by: As directed    Discharge instructions   Complete by: As directed    Please take your medications as prescribed.  Go for your regular dialysis session along with a family member can stay with you during dialysis.  You were cared for by a hospitalist during your hospital stay. If you have any questions about your discharge medications or the care you received while you were in the hospital after you are discharged, you can call the unit and asked to speak with the hospitalist on call if the hospitalist that took care of you is not available. Once you are discharged, your primary care physician will handle any further medical issues. Please note that NO REFILLS for any discharge medications will be authorized once you are discharged, as it is imperative  that you return to your primary care physician (or establish a relationship with a primary care physician if you do not have one) for your aftercare needs so that they can reassess your need for medications and monitor your lab values. If you do not have a primary care physician, you can call [phone] for a physician referral.   Increase activity slowly   Complete by: As directed    No wound care   Complete by: As directed          Allergies as of 12/18/2020       Reactions   Morphine And Related Other (See Comments)   Family request not to be given, reports pt does not wake up when given    Gabapentin Other (See Comments)   Pt is hard to wake up when this is given per sister   Oxycodone Other (See  Comments)   Pt is hard to wake up when this is given per sister   Promethazine Hcl Other (See Comments)   lethargy        Medication List     STOP taking these medications    antiseptic oral rinse Liqd   glycopyrrolate 1 MG tablet Commonly known as: ROBINUL       TAKE these medications    acetaminophen 325 MG tablet Commonly known as: TYLENOL Take 2 tablets (650 mg total) by mouth every 6 (six) hours as needed for mild pain (or Fever >/= 101).   Apixaban Starter Pack (10mg  and 5mg ) Commonly known as: ELIQUIS STARTER PACK Take as directed on package: start with two-5mg  tablets twice daily for 7 days. On day 8, switch to one-5mg  tablet twice daily.   apixaban 5 MG Tabs tablet Commonly known as: ELIQUIS Take 1 tablet (5 mg total) by mouth 2 (two) times daily. Start only after you have completed the starter pack. Start taking on: January 19, 2021   haloperidol 0.5 MG tablet Commonly known as: HALDOL Take 1 tablet (0.5 mg total) by mouth every 6 (six) hours as needed for agitation.   Nepro Liqd Take 237 mLs by mouth 3 (three) times daily.   ondansetron 4 MG disintegrating tablet Commonly known as: ZOFRAN-ODT Take 1 tablet (4 mg total) by mouth every 6 (six) hours  as needed for nausea.   oxyCODONE 20 MG/ML concentrated solution Commonly known as: ROXICODONE INTENSOL Place 0.3 mLs (6 mg total) under the tongue every 2 (two) hours as needed for moderate pain or severe pain (or dyspnea).   OXYGEN Inhale 2 L into the lungs as needed. CPAP with oxygen at bedtime   polyvinyl alcohol 1.4 % ophthalmic solution Commonly known as: LIQUIFILM TEARS Place 1 drop into both eyes 4 (four) times daily as needed for dry eyes.   senna 8.6 MG tablet Commonly known as: Senokot Take 2 tablets (17.2 mg total) by mouth 2 (two) times daily. May crush, mix with water and give sublingually if needed.          Follow-up Information     Andree Moro, DO. Schedule an appointment as soon as possible for a visit in 1 week(s).   Specialty: General Practice Contact information: [place] [address] [phone]                 TOTAL DISCHARGE TIME: 67 minutes  [facility] Pager on [email or web address]  12/18/2020, 12:00 PM

## 2020-12-18 NOTE — Progress Notes (Signed)
[facility] Progress Note   Subjective:   Patient seen and examined at bedside.  Not wanting to answer questions.  Shakes her hands at me and says " your gonna get yours."  Objective Vitals:   12/17/20 2100 12/18/20 0000 12/18/20 0400 12/18/20 0759  BP: (!) 146/62 (!) 150/60 (!) 150/61 (!) 149/60  Pulse: 90 95 94 82  Resp: 14 13 12 12   Temp: 98.5 F (36.9 C) 98.3 F (36.8 C) 98.3 F (36.8 C) 98.6 F (37 C)  TempSrc: Axillary Axillary Axillary Axillary  SpO2: 100% 99%  99%  Weight:      Height:       Physical Exam General:chronically ill appearing confused, elderly female in NAD, with mittens on hands Heart:RRR, no MRG Lungs:CTAB, nml WOB on RA Abdomen:soft, NTND Extremities:no LE edema Dialysis Access: [facility]   Filed Weights   12/15/20 1120 12/16/20 0458 12/17/20 0456  Weight: 72.5 kg 73.9 kg 73.2 kg    Intake/Output Summary (Last 24 hours) at 12/18/2020 1130 Last data filed at 12/17/2020 1325 Gross per 24 hour  Intake 400 ml  Output --  Net 400 ml    Additional Objective Labs: Basic Metabolic Panel: Recent Labs  Lab 12/14/20 0332 12/15/20 0825  NA 149* 139  K 6.2* 3.6  CL 111 102  CO2 26 25  GLUCOSE 119* 69*  BUN 111* 43*  CREATININE 16.40* 8.72*  CALCIUM 8.6* 7.9*  PHOS  --  3.4   Liver Function Tests: Recent Labs  Lab 12/15/20 0825  ALBUMIN 2.2*    CBC: Recent Labs  Lab 12/15/20 0500 12/16/20 0203 12/17/20 0102 12/18/20 0623  WBC 8.5 10.9* 11.1* 15.3*  HGB 8.4* 10.0* 9.0* 8.7*  HCT 27.3* 30.9* 27.2* 26.0*  MCV 82.2 80.1 79.3* 79.5*  PLT 185 199 187 181   Blood Culture    Component Value Date/Time   SDES BLOOD LEFT ANTECUBITAL 11/09/2020 1734   SPECREQUEST  11/09/2020 1734    BOTTLES DRAWN AEROBIC AND ANAEROBIC Blood Culture adequate volume   CULT  11/09/2020 1734    NO GROWTH 5 DAYS Performed at [facility], [address], [place], [place] [address]    REPTSTATUS 11/14/2020 FINAL 11/09/2020 1734     CBG: Recent Labs  Lab 12/17/20 0829 12/17/20 1203 12/17/20 1617 12/17/20 2058 12/18/20 0800  GLUCAP 146* 155* 202* 137* 111*   Studies/Results: No results found.  Medications:  heparin 850 Units/hr (12/18/20 1101)    Chlorhexidine Gluconate Cloth  6 each Topical Q0600   collagenase   Topical Daily   darbepoetin (ARANESP) injection - DIALYSIS  200 mcg Intravenous Q Fri-HD   feeding supplement  237 mL Oral TID BM   sodium chloride flush  3 mL Intravenous Q12H    Dialysis Orders: Former orders:  [place]  on TTS . 180NRe, 4 hours, BFR 400, DFR 500, EDW 77kg 4K, 2.25Ca, TDC Heparin 6000 unit bolus Mircera 229mcg IV q 2 weeks- last dose 10/31/20 Hectorol 52mcg IV q HD       Assessment/Plan: Hypoxia: CT angio of the chest showed left lower segmental lobe embolus.  Restarted on heparin.  She did have a PE during last admission but was refusing oral Eliquis.  We will attempt to optimize volume status with dialysis.  Management per primary team. They want sub q lovenox-  really not a great solution with ESRD-  would say eliquis or nothing  ESRD: Family transition to comfort care after last admission, then decided to restart  dialysis.  Patient last had 1 hour of outpatient dialysis on 12/09/2020.   Potassium, BUN and creatinine were all significantly elevated.  Had 2-hour dialysis treatment 11/24,  HD again 11/25.  Will plan to  resume T,Th,S Schedule on Tuesday-  therefore will still get in 3 treatments this week.  Our agreement was that we were willing to continue OP dialysis if family provided sitter and she tolerated treatment without being a major disruption to the OP unit  Hypertension/volume: BP variable, soft to mildly elevated.  Does not appear to have significant volume overload on chest CT or exam.   Anemia: hgb 8.4-  resumed ESA  Metabolic bone disease: Corrected calc and phos OK-  no meds   Nutrition: We will check albumin with next labs. Dispo: Patient was agitated and  refused p.o. medications during last admission.  Her mental status seems to be the same as last admission at this time.  She was made DNR and transition to hospice however patient's family revoked this and made her full code shortly after.  Recommend ongoing palliative care discussions but need to continue HD for now.  Patient ok to d/c from a renal standpoint if family has already made arrangements for a sitter to be present at all outpatient dialysis treatments going forward.   Jen Mow, PA-C [facility] 12/18/2020,11:30 AM  LOS: 4 days

## 2020-12-18 NOTE — Progress Notes (Addendum)
[place] for heparin>>apixaban>>heparin Indication: pulmonary embolus  Allergies  Allergen Reactions   Morphine And Related Other (See Comments)    Family request not to be given, reports pt does not wake up when given    Gabapentin Other (See Comments)    Pt is hard to wake up when this is given per sister    Oxycodone Other (See Comments)    Pt is hard to wake up when this is given per sister   Promethazine Hcl Other (See Comments)    lethargy    Patient Measurements: Height: 5\' 4"  (162.6 cm) Weight: 73.2 kg (161 lb 6 oz) IBW/kg (Calculated) : 54.7 Heparin Dosing Weight: 70kg  Vital Signs: Temp: 98.3 F (36.8 C) (11/28 0400) Temp Source: Axillary (11/28 0400) BP: 150/61 (11/28 0400) Pulse Rate: 94 (11/28 0400)  Labs: Recent Labs    12/15/20 0825 12/15/20 1442 12/16/20 0203 12/17/20 0102 12/18/20 0623  HGB  --   --  10.0* 9.0*  --   HCT  --   --  30.9* 27.2*  --   PLT  --   --  199 187  --   HEPARINUNFRC  --    < > 0.62 0.32 0.50  CREATININE 8.72*  --   --   --   --    < > = values in this interval not displayed.     Estimated Creatinine Clearance: 5.5 mL/min (A) (by C-G formula based on SCr of 8.72 mg/dL (H)).   Assessment: 75yo female with ESRD HD TTS, recently admitted 11/09/20-11/23/20 for hypoxia 2/2 acute left lower lobe PE. At that time the patient was refusing oral medications, so was switched to hospice care with DNR status. Sister thought patient was improving, so DNR status was reversed to full code. Patient readmitted with hypoxia as she has advanced dementia and is continuing refusal of oral medications. Pharmacy has been consulted for heparin dosing.   Heparin level is therapeutic at 0.5 with heparin running at 850 units/h. Heparin levels being drawn from the same arm, but distal to the IV site as other arm is restricted and unable to do footstick as the patient is diabetic. Slight Hgb decrease from 10>9,  patient received HD 11/26. No overt signs of bleeding noted.    Goal of Therapy:  Heparin level 0.3-0.7 units/ml Monitor platelets by anticoagulation protocol: Yes   Plan:  Continue heparin infusion at 850 units/hr F/U daily heparin level, CBC Monitor for signs and symptoms of bleeding   Gladys Deckard A. Levada Dy, PharmD, BCPS, FNKF Clinical Pharmacist [place] Please utilize Amion for appropriate phone number to reach the unit pharmacist ([place])  Addendum: Pharmacy consulted to transition to PO apixaban. Patient was supposed to be on PO apixaban PTA, but questionable whether taking. D/W Dr. [place] Pink, will reload patient with 10mg  BID x 7 days, then 5mg  BID. Will d/c heparin drip once first dose of apixaban is given.   Gustavo Meditz A. Levada Dy, PharmD, BCPS, FNKF Clinical Pharmacist [place] Please utilize Amion for appropriate phone number to reach the unit pharmacist ([place])  2nd addendum: Patient still refusing PO meds including above apixaban dose. D/w Dr. [place] Pink, will resume heparin drip and keep on this until discharge home. D/c apixaban orders.   Conner Muegge A. Levada Dy, PharmD, BCPS, FNKF Clinical Pharmacist [place] Please utilize Amion for appropriate phone number to reach the unit pharmacist ([place])     12/18/2020 7:45 AM

## 2020-12-18 NOTE — Consult Note (Signed)
WOC Nurse Consult Note: Patient receiving care in [facility] 5N6. Reason for Consult: "stage 2 ulcer to back" Wound type: NO wounds found on the patient's back.  There was an unstageable wound found on the coccyx, and scattered partial thickness loss of tissue related to MASD-IAD.  Pressure Injury POA: Yes Measurement: 4 cm x 4 cm Wound bed: pink, yellow, tan Drainage (amount, consistency, odor) none Periwound: impacted by MASD-IAD Dressing procedure/placement/frequency:  Apply Santyl to coccyx in a nickel thick layer. Cover with a saline moistened gauze, then dry gauze or ABD pad.  Change daily.   I also ordered bilateral Prevalon heel lift boots and a standard size bed with air mattress.  Monitor the wound area(s) for worsening of condition such as: Signs/symptoms of infection,  Increase in size,  Development of or worsening of odor, Development of pain, or increased pain at the affected locations.  Notify the medical team if any of these develop.  Thank you for the consult.  Discussed plan of care with the bedside nurse.  [place] nurse will not follow at this time.  Please re-consult the [place] team if needed.  Val Riles, RN, MSN, CWOCN, CNS-BC, pager [phone]

## 2020-12-18 NOTE — Care Management Important Message (Signed)
Important Message  Patient Details  Name: Joann Gutierrez MRN: [id number] Date of Birth: 1945/12/19   Medicare Important Message Given:        [name] 12/18/2020, 2:12 PM

## 2020-12-26 ENCOUNTER — Encounter (HOSPITAL_COMMUNITY): Payer: Self-pay | Admitting: Emergency Medicine

## 2020-12-26 ENCOUNTER — Emergency Department (HOSPITAL_COMMUNITY): Payer: Medicare HMO

## 2020-12-26 ENCOUNTER — Inpatient Hospital Stay (HOSPITAL_COMMUNITY)
Admission: EM | Admit: 2020-12-26 | Discharge: 2021-01-21 | DRG: 871 | Disposition: E | Payer: Medicare HMO | Attending: Internal Medicine | Admitting: Internal Medicine

## 2020-12-26 ENCOUNTER — Other Ambulatory Visit: Payer: Self-pay

## 2020-12-26 DIAGNOSIS — Z90710 Acquired absence of both cervix and uterus: Secondary | ICD-10-CM

## 2020-12-26 DIAGNOSIS — E43 Unspecified severe protein-calorie malnutrition: Secondary | ICD-10-CM | POA: Diagnosis present

## 2020-12-26 DIAGNOSIS — R6520 Severe sepsis without septic shock: Secondary | ICD-10-CM | POA: Diagnosis not present

## 2020-12-26 DIAGNOSIS — J449 Chronic obstructive pulmonary disease, unspecified: Secondary | ICD-10-CM | POA: Diagnosis present

## 2020-12-26 DIAGNOSIS — Z66 Do not resuscitate: Secondary | ICD-10-CM | POA: Diagnosis present

## 2020-12-26 DIAGNOSIS — Z888 Allergy status to other drugs, medicaments and biological substances status: Secondary | ICD-10-CM

## 2020-12-26 DIAGNOSIS — I2510 Atherosclerotic heart disease of native coronary artery without angina pectoris: Secondary | ICD-10-CM | POA: Diagnosis present

## 2020-12-26 DIAGNOSIS — Z992 Dependence on renal dialysis: Secondary | ICD-10-CM | POA: Diagnosis not present

## 2020-12-26 DIAGNOSIS — R627 Adult failure to thrive: Secondary | ICD-10-CM | POA: Diagnosis not present

## 2020-12-26 DIAGNOSIS — A419 Sepsis, unspecified organism: Principal | ICD-10-CM | POA: Diagnosis present

## 2020-12-26 DIAGNOSIS — Z7189 Other specified counseling: Secondary | ICD-10-CM | POA: Diagnosis not present

## 2020-12-26 DIAGNOSIS — D573 Sickle-cell trait: Secondary | ICD-10-CM | POA: Diagnosis present

## 2020-12-26 DIAGNOSIS — N186 End stage renal disease: Secondary | ICD-10-CM | POA: Diagnosis present

## 2020-12-26 DIAGNOSIS — Z87891 Personal history of nicotine dependence: Secondary | ICD-10-CM

## 2020-12-26 DIAGNOSIS — R630 Anorexia: Secondary | ICD-10-CM | POA: Diagnosis present

## 2020-12-26 DIAGNOSIS — E1143 Type 2 diabetes mellitus with diabetic autonomic (poly)neuropathy: Secondary | ICD-10-CM | POA: Diagnosis present

## 2020-12-26 DIAGNOSIS — I480 Paroxysmal atrial fibrillation: Secondary | ICD-10-CM | POA: Diagnosis not present

## 2020-12-26 DIAGNOSIS — M810 Age-related osteoporosis without current pathological fracture: Secondary | ICD-10-CM | POA: Diagnosis present

## 2020-12-26 DIAGNOSIS — Z9981 Dependence on supplemental oxygen: Secondary | ICD-10-CM

## 2020-12-26 DIAGNOSIS — I132 Hypertensive heart and chronic kidney disease with heart failure and with stage 5 chronic kidney disease, or end stage renal disease: Secondary | ICD-10-CM | POA: Diagnosis present

## 2020-12-26 DIAGNOSIS — L89322 Pressure ulcer of left buttock, stage 2: Secondary | ICD-10-CM | POA: Diagnosis present

## 2020-12-26 DIAGNOSIS — F32A Depression, unspecified: Secondary | ICD-10-CM | POA: Diagnosis present

## 2020-12-26 DIAGNOSIS — Z515 Encounter for palliative care: Secondary | ICD-10-CM | POA: Diagnosis not present

## 2020-12-26 DIAGNOSIS — E785 Hyperlipidemia, unspecified: Secondary | ICD-10-CM | POA: Diagnosis present

## 2020-12-26 DIAGNOSIS — E1142 Type 2 diabetes mellitus with diabetic polyneuropathy: Secondary | ICD-10-CM | POA: Diagnosis present

## 2020-12-26 DIAGNOSIS — Z79899 Other long term (current) drug therapy: Secondary | ICD-10-CM

## 2020-12-26 DIAGNOSIS — E1122 Type 2 diabetes mellitus with diabetic chronic kidney disease: Secondary | ICD-10-CM | POA: Diagnosis present

## 2020-12-26 DIAGNOSIS — I2729 Other secondary pulmonary hypertension: Secondary | ICD-10-CM | POA: Diagnosis present

## 2020-12-26 DIAGNOSIS — Z800 Family history of malignant neoplasm of digestive organs: Secondary | ICD-10-CM

## 2020-12-26 DIAGNOSIS — Z96652 Presence of left artificial knee joint: Secondary | ICD-10-CM | POA: Diagnosis present

## 2020-12-26 DIAGNOSIS — Z86711 Personal history of pulmonary embolism: Secondary | ICD-10-CM

## 2020-12-26 DIAGNOSIS — R509 Fever, unspecified: Secondary | ICD-10-CM

## 2020-12-26 DIAGNOSIS — B964 Proteus (mirabilis) (morganii) as the cause of diseases classified elsewhere: Secondary | ICD-10-CM | POA: Diagnosis present

## 2020-12-26 DIAGNOSIS — Z833 Family history of diabetes mellitus: Secondary | ICD-10-CM

## 2020-12-26 DIAGNOSIS — K224 Dyskinesia of esophagus: Secondary | ICD-10-CM | POA: Diagnosis present

## 2020-12-26 DIAGNOSIS — K3184 Gastroparesis: Secondary | ICD-10-CM | POA: Diagnosis present

## 2020-12-26 DIAGNOSIS — E1169 Type 2 diabetes mellitus with other specified complication: Secondary | ICD-10-CM | POA: Diagnosis present

## 2020-12-26 DIAGNOSIS — Z7401 Bed confinement status: Secondary | ICD-10-CM

## 2020-12-26 DIAGNOSIS — I5032 Chronic diastolic (congestive) heart failure: Secondary | ICD-10-CM | POA: Diagnosis present

## 2020-12-26 DIAGNOSIS — Z832 Family history of diseases of the blood and blood-forming organs and certain disorders involving the immune mechanism: Secondary | ICD-10-CM

## 2020-12-26 DIAGNOSIS — E869 Volume depletion, unspecified: Secondary | ICD-10-CM | POA: Diagnosis present

## 2020-12-26 DIAGNOSIS — L89154 Pressure ulcer of sacral region, stage 4: Secondary | ICD-10-CM | POA: Diagnosis present

## 2020-12-26 DIAGNOSIS — Z885 Allergy status to narcotic agent status: Secondary | ICD-10-CM

## 2020-12-26 DIAGNOSIS — F0390 Unspecified dementia without behavioral disturbance: Secondary | ICD-10-CM | POA: Diagnosis present

## 2020-12-26 DIAGNOSIS — D631 Anemia in chronic kidney disease: Secondary | ICD-10-CM | POA: Diagnosis present

## 2020-12-26 DIAGNOSIS — Z7901 Long term (current) use of anticoagulants: Secondary | ICD-10-CM

## 2020-12-26 DIAGNOSIS — F03C18 Unspecified dementia, severe, with other behavioral disturbance: Secondary | ICD-10-CM | POA: Diagnosis present

## 2020-12-26 DIAGNOSIS — K219 Gastro-esophageal reflux disease without esophagitis: Secondary | ICD-10-CM | POA: Diagnosis present

## 2020-12-26 DIAGNOSIS — I2699 Other pulmonary embolism without acute cor pulmonale: Secondary | ICD-10-CM | POA: Diagnosis present

## 2020-12-26 DIAGNOSIS — H9193 Unspecified hearing loss, bilateral: Secondary | ICD-10-CM | POA: Diagnosis present

## 2020-12-26 DIAGNOSIS — Z86010 Personal history of colonic polyps: Secondary | ICD-10-CM

## 2020-12-26 DIAGNOSIS — Z8249 Family history of ischemic heart disease and other diseases of the circulatory system: Secondary | ICD-10-CM

## 2020-12-26 DIAGNOSIS — Z20822 Contact with and (suspected) exposure to covid-19: Secondary | ICD-10-CM | POA: Diagnosis present

## 2020-12-26 DIAGNOSIS — L89313 Pressure ulcer of right buttock, stage 3: Secondary | ICD-10-CM | POA: Diagnosis present

## 2020-12-26 DIAGNOSIS — B952 Enterococcus as the cause of diseases classified elsewhere: Secondary | ICD-10-CM | POA: Diagnosis present

## 2020-12-26 DIAGNOSIS — I4891 Unspecified atrial fibrillation: Secondary | ICD-10-CM | POA: Diagnosis present

## 2020-12-26 DIAGNOSIS — G4733 Obstructive sleep apnea (adult) (pediatric): Secondary | ICD-10-CM | POA: Diagnosis present

## 2020-12-26 DIAGNOSIS — Z6827 Body mass index (BMI) 27.0-27.9, adult: Secondary | ICD-10-CM

## 2020-12-26 DIAGNOSIS — Z8673 Personal history of transient ischemic attack (TIA), and cerebral infarction without residual deficits: Secondary | ICD-10-CM

## 2020-12-26 DIAGNOSIS — Z841 Family history of disorders of kidney and ureter: Secondary | ICD-10-CM

## 2020-12-26 DIAGNOSIS — Z993 Dependence on wheelchair: Secondary | ICD-10-CM

## 2020-12-26 LAB — CBG MONITORING, ED: Glucose-Capillary: 184 mg/dL — ABNORMAL HIGH (ref 70–99)

## 2020-12-26 LAB — APTT: aPTT: 35 seconds (ref 24–36)

## 2020-12-26 LAB — CBC WITH DIFFERENTIAL/PLATELET
Abs Immature Granulocytes: 7.78 10*3/uL — ABNORMAL HIGH (ref 0.00–0.07)
Basophils Absolute: 0.3 10*3/uL — ABNORMAL HIGH (ref 0.0–0.1)
Basophils Relative: 1 %
Eosinophils Absolute: 0 10*3/uL (ref 0.0–0.5)
Eosinophils Relative: 0 %
HCT: 26.6 % — ABNORMAL LOW (ref 36.0–46.0)
Hemoglobin: 8.6 g/dL — ABNORMAL LOW (ref 12.0–15.0)
Immature Granulocytes: 15 %
Lymphocytes Relative: 2 %
Lymphs Abs: 1.1 10*3/uL (ref 0.7–4.0)
MCH: 25.6 pg — ABNORMAL LOW (ref 26.0–34.0)
MCHC: 32.3 g/dL (ref 30.0–36.0)
MCV: 79.2 fL — ABNORMAL LOW (ref 80.0–100.0)
Monocytes Absolute: 1.9 10*3/uL — ABNORMAL HIGH (ref 0.1–1.0)
Monocytes Relative: 4 %
Neutro Abs: 39.5 10*3/uL — ABNORMAL HIGH (ref 1.7–7.7)
Neutrophils Relative %: 78 %
Platelets: 170 10*3/uL (ref 150–400)
RBC: 3.36 MIL/uL — ABNORMAL LOW (ref 3.87–5.11)
RDW: 21.1 % — ABNORMAL HIGH (ref 11.5–15.5)
WBC: 50.6 10*3/uL (ref 4.0–10.5)
nRBC: 0.7 % — ABNORMAL HIGH (ref 0.0–0.2)

## 2020-12-26 LAB — LACTIC ACID, PLASMA
Lactic Acid, Venous: 6.9 mmol/L (ref 0.5–1.9)
Lactic Acid, Venous: 6.4 mmol/L (ref 0.5–1.9)
Lactic Acid, Venous: 3.6 mmol/L (ref 0.5–1.9)

## 2020-12-26 LAB — COMPREHENSIVE METABOLIC PANEL
ALT: 14 U/L (ref 0–44)
AST: 19 U/L (ref 15–41)
Albumin: 1.7 g/dL — ABNORMAL LOW (ref 3.5–5.0)
Alkaline Phosphatase: 177 U/L — ABNORMAL HIGH (ref 38–126)
Anion gap: 23 — ABNORMAL HIGH (ref 5–15)
BUN: 62 mg/dL — ABNORMAL HIGH (ref 8–23)
CO2: 17 mmol/L — ABNORMAL LOW (ref 22–32)
Calcium: 9.1 mg/dL (ref 8.9–10.3)
Chloride: 101 mmol/L (ref 98–111)
Creatinine, Ser: 9.7 mg/dL — ABNORMAL HIGH (ref 0.44–1.00)
GFR, Estimated: 4 mL/min — ABNORMAL LOW (ref >60)
Glucose, Bld: 333 mg/dL — ABNORMAL HIGH (ref 70–99)
Potassium: 4.6 mmol/L (ref 3.5–5.1)
Sodium: 141 mmol/L (ref 135–145)
Total Bilirubin: 1.1 mg/dL (ref 0.3–1.2)
Total Protein: 6.8 g/dL (ref 6.5–8.1)

## 2020-12-26 LAB — PROTIME-INR
INR: 1.5 — ABNORMAL HIGH (ref 0.8–1.2)
Prothrombin Time: 18.1 seconds — ABNORMAL HIGH (ref 11.4–15.2)

## 2020-12-26 LAB — RESP PANEL BY RT-PCR (FLU A&B, COVID) ARPGX2
Influenza A by PCR: NEGATIVE
Influenza B by PCR: NEGATIVE
SARS Coronavirus 2 by RT PCR: NEGATIVE

## 2020-12-26 MED ORDER — ACETAMINOPHEN 325 MG PO TABS: 650.0000 mg | ORAL_TABLET | Freq: Four times a day (QID) | ORAL | Status: DC | PRN

## 2020-12-26 MED ORDER — INSULIN ASPART 100 UNIT/ML IJ SOLN
0.0000 [IU] | Freq: Three times a day (TID) | INTRAMUSCULAR | Status: DC
  Administered 2020-12-27 (×2): 2 [IU] via SUBCUTANEOUS
  Administered 2020-12-28 (×2): 1 [IU] via SUBCUTANEOUS

## 2020-12-26 MED ORDER — SODIUM CHLORIDE 0.9 % IV BOLUS
500.0000 mL | Freq: Once | INTRAVENOUS | Status: AC
  Administered 2020-12-26: 500 mL via INTRAVENOUS

## 2020-12-26 MED ORDER — SODIUM CHLORIDE 0.9 % IV SOLN
1.0000 g | INTRAVENOUS | Status: DC
  Filled 2020-12-26: qty 1

## 2020-12-26 MED ORDER — ACETAMINOPHEN 650 MG RE SUPP
650.0000 mg | Freq: Four times a day (QID) | RECTAL | Status: DC | PRN
  Administered 2020-12-27 – 2020-12-28 (×3): 650 mg via RECTAL
  Filled 2020-12-26 (×3): qty 1

## 2020-12-26 MED ORDER — VANCOMYCIN HCL 1500 MG/300ML IV SOLN
1500.0000 mg | Freq: Once | INTRAVENOUS | Status: AC
  Administered 2020-12-26: 1500 mg via INTRAVENOUS
  Filled 2020-12-26: qty 300

## 2020-12-26 MED ORDER — HEPARIN (PORCINE) 25000 UT/250ML-% IV SOLN
1250.0000 [IU]/h | INTRAVENOUS | Status: DC
  Administered 2020-12-27: 1250 [IU]/h via INTRAVENOUS
  Filled 2020-12-26: qty 250

## 2020-12-26 MED ORDER — ONDANSETRON HCL 4 MG/2ML IJ SOLN: 4.0000 mg | Freq: Four times a day (QID) | INTRAMUSCULAR | Status: DC | PRN

## 2020-12-26 MED ORDER — SODIUM CHLORIDE 0.9 % IV SOLN
2.0000 g | Freq: Once | INTRAVENOUS | Status: AC
  Administered 2020-12-26: 2 g via INTRAVENOUS
  Filled 2020-12-26: qty 2

## 2020-12-26 MED ORDER — ALBUMIN HUMAN 25 % IV SOLN
25.0000 g | Freq: Four times a day (QID) | INTRAVENOUS | Status: AC
  Administered 2020-12-26 – 2020-12-27 (×3): 25 g via INTRAVENOUS
  Filled 2020-12-26 (×4): qty 100

## 2020-12-26 MED ORDER — HEPARIN SODIUM (PORCINE) 5000 UNIT/ML IJ SOLN: 5000.0000 [IU] | Freq: Three times a day (TID) | INTRAMUSCULAR | Status: DC

## 2020-12-26 MED ORDER — PANTOPRAZOLE SODIUM 40 MG IV SOLR
40.0000 mg | INTRAVENOUS | Status: DC
  Administered 2020-12-26 – 2020-12-27 (×2): 40 mg via INTRAVENOUS
  Filled 2020-12-26 (×2): qty 40

## 2020-12-26 MED ORDER — SODIUM CHLORIDE 0.45 % IV SOLN
INTRAVENOUS | Status: AC
  Filled 2020-12-26: qty 75

## 2020-12-26 MED ORDER — INSULIN ASPART 100 UNIT/ML IV SOLN
6.0000 [IU] | Freq: Once | INTRAVENOUS | Status: AC
  Administered 2020-12-26: 6 [IU] via INTRAVENOUS

## 2020-12-26 MED ORDER — ACETAMINOPHEN 650 MG RE SUPP
650.0000 mg | Freq: Once | RECTAL | Status: AC
  Administered 2020-12-26: 650 mg via RECTAL
  Filled 2020-12-26: qty 1

## 2020-12-26 MED ORDER — METRONIDAZOLE 500 MG/100ML IV SOLN
500.0000 mg | Freq: Two times a day (BID) | INTRAVENOUS | Status: DC
  Administered 2020-12-26: 500 mg via INTRAVENOUS
  Filled 2020-12-26 (×2): qty 100

## 2020-12-26 MED ORDER — ONDANSETRON HCL 4 MG PO TABS: 4.0000 mg | ORAL_TABLET | Freq: Four times a day (QID) | ORAL | Status: DC | PRN

## 2020-12-26 NOTE — Consult Note (Signed)
NAME:  Joann Gutierrez, MRN:  [id number], DOB:  March 15, 1945, LOS: 0 ADMISSION DATE:  01/07/2021 CONSULTATION DATE:  01/03/2021 REFERRING MD:  Francia Greaves - EDP CHIEF COMPLAINT:  Concern for sepsis  History of Present Illness:  75 year old woman who presented to [facility] ED via EMS 12/6 for increased drainage from sacral decubitus wound and concern for sepsis. 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 Recent admissions including 10/20-11/3 for hypoxia/acute LUL PE (with noted discharge on hospice care/DNR status) and 11/23-11/28 for hypoxia and PE (with reversal of DNR to full code status and request for full scope of care).  Upon speaking with pt's son he states he sent her to the hospital for wound care. He endorses that she has been otherwise in her normal state of health with the exception of lack of enteral intake about 2 weeks ago. He states that she will allow them to give her a spoonful of food every now and then but she "doesn't want to eat anymore". I explained that often that means the pt is ill or in her case with dementia that is part of the process of dying. I explained pt is ill and while we should do everything we can to help her that remaining full code would be futile and that should her heart stop she should be allowed to pass peacefully and with dignity at that time. He expressed understanding and stated he would be amendable to that decision once we spoke with the patients sister who was coming to bedside.   On ED arrival, patient was febrile to Tmax 100.58F, tachycardic to 130s with BP 105/63. Labs demonstrated WBC 50.6, Hgb 8.6 (baseline), INR 1.5 (on Eliquis), LA 6.9, BUN 62, Cr 9.7 (on HD), grossly normal LFTs. CXR was unremarkable. Blood and urine cultures were ordered. Empiric cefepime/vanc initiated for presumed sepsis.  PCCM  consulted for evaluation.  Pertinent Medical History:   Past Medical History:  Diagnosis Date   Adenomatous colon polyp    Allergy    Anemia    Asthma        CAD (coronary artery disease)    Mild very minimal coronary disease with 20% obtuse marginal stenosis   Carpal tunnel syndrome on left    Cataract    CHF (congestive heart failure) (HCC)    Chronic kidney disease (CKD), stage III (moderate) (HCC)    now stage 4   COPD (chronic obstructive pulmonary disease) ([place])    Dementia with behavioral disturbance 03/09/2020   Depression    Diabetes mellitus 1997   Type II    Diverticulosis    Dyspnea    Elevated diaphragm November 2011   Right side   Esophageal dysmotility    Esophageal stricture    ESRD on hemodialysis ([place]) 03/09/2020   Gastritis    Gastroparesis 08/21/2007   GERD (gastroesophageal reflux disease)    Hearing loss of both ears    Hernia, hiatal    Hyperlipidemia    Hypertension    Morbid obesity ([place])    OSTEOARTHRITIS 08/09/2006   Osteoporosis    Oxygen deficiency    prn    PERIPHERAL NEUROPATHY, FEET 09/23/2007   RENAL INSUFFICIENCY 02/16/2009   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 Febrile with lactic acidosis and WBC 50K. BCx/Ucx pending. Cefepime/vanc started.  Interim History / Subjective:  PCCM consulted for evaluation  Objective:  Blood pressure (!) 113/59, pulse (!) 122, temperature 98 F (36.7 C), temperature source Temporal, resp. rate 20, height 5\' 4"  (1.626 m), weight 73 kg, SpO2 100 %.        Intake/Output Summary (Last 24 hours) at 01/16/2021  1729 Last data filed at 12/25/2020 1723 Gross per 24 hour  Intake 500 ml  Output --  Net 500 ml   Filed Weights   12/24/2020 1515  Weight: 73 kg   Physical Examination: General: Chronically ill-appearing non verbal, gaze preference to L but also eomi in NAD laying completely supine  HEENT: [place]/AT, anicteric sclera, PERRL, dry and thick white covered mucous membranes. Neuro:  awake, non verbal, not following commands, upper extremities in mittens.   Responds to tactile stimuli. Not following commands. Moves all 4 extremities spontaneously. Strength 2/5 in 4 extremities.  CV: RRR, no m/g/r. R tunneled dialysis catheter in place with some thick drainage adhered.  PULM: Breathing even and unlabored on RA. Lung fields CTA. GI: Soft, nontender, nondistended. Normoactive bowel sounds. Extremities: no LE edema noted.  Skin: Warm/dry, extremely dry and flaky skin.  Resolved Hospital Problem List:    Assessment & Plan:  Concern for sepsis in the setting of increased wound drainage Lactic acidosis BIB [facility] after family called for increased sacral wound drainage. Febrile to 100.39F with leukocytosis to 50K, LA 6.9. - Admit to progressive for close observation - Fluid resuscitation encouraged over  pressors. Pt is on RA and laying completely supine in bed without distress skin is dry, she is without any edema and BP is at her baseline (dialysis pt on no anti-htn) pt nephrology note dry wt is 77kg and at presentation today she is 73kg.  - Trend WBC, fever curve - Trend LA - F/u BCx/Ucx - Empiric cefepime/vanc until cultures result  Pulmonary embolism Recent admissions 10/20-11/3 and 11/23-11/28 for PE. Heparin gtt while in the hospital, started on Eliquis at discharge. Variable/poor renal function complicating AC agent choice. - Heparin gtt while in-house in the event patient needs procedural intervention - Trend H&H - Monitor for signs of active bleeding  COPD OSA - Supplemental O2 as  needed for sat > 90% (currently on RA) - Pulmonary hygiene - Bronchodilators PRN  ESRD on HD Dialysis-dependent TTS. Dialyzes via R TDC. - Nephrology consult - Trend BMP - Replete electrolytes as indicated - Monitor I&Os - F/u urine studies - Avoid nephrotoxic agents as able  Sacral decubitus wound Unstageable sacral decubitus ulcer. Previously seen by WOC. - WOC consult  Severe protein calorie malnutrition Severe deconditioning Wheelchair bound status Dementia Poor nutritional status at baseline, per son patient has not been eating well for several weeks. - Nutrition consult as appropriate - PT/OT/SLP  GOC Pending trh conversation with pt's sister. [name] is amendable to DNR after it was explained in her malnourished and chronically ill state she would not survive chest compressions or be liberated from vent.   Best Practice: (right click and "Reselect all SmartList Selections" daily)   Per Primary Team  Labs:  CBC:  Recent Labs  Lab 01/07/2021 1503  WBC 50.6*  NEUTROABS 39.5*  HGB 8.6*  HCT 26.6*  MCV 79.2*  PLT 449   Basic Metabolic Panel: Recent Labs  Lab 01/14/2021 1503  NA 141  K 4.6  CL 101  CO2 17*  GLUCOSE 333*  BUN 62*  CREATININE 9.70*  CALCIUM 9.1   GFR: Estimated Creatinine Clearance: 4.9 mL/min (A) (by C-G formula based on SCr of 9.7 mg/dL (H)). Recent Labs  Lab 01/15/2021 1503  WBC 50.6*  LATICACIDVEN 6.9*   Liver Function Tests: Recent Labs  Lab 12/24/2020 1503  AST 19  ALT 14  ALKPHOS 177*  BILITOT 1.1  PROT 6.8  ALBUMIN 1.7*   No results for input(s): LIPASE, AMYLASE in the last 168 hours. No results for input(s): AMMONIA in the last 168 hours.  ABG:    Component Value Date/Time   PHART 7.521 (H) 11/10/2020 0227   PCO2ART 29.0 (L) 11/10/2020 0227   PO2ART 137 (H) 11/10/2020 0227   HCO3 23.7 11/10/2020 0227   TCO2 25 11/10/2020 0227   ACIDBASEDEF 7.0 (H) 02/16/2020 0643   O2SAT 99.0 11/10/2020 0227    Coagulation  Profile: Recent Labs  Lab 01/10/2021 1503  INR 1.5*   Cardiac Enzymes: No results for input(s): CKTOTAL, CKMB, CKMBINDEX, TROPONINI in the last 168 hours.  HbA1C: Hgb A1c MFr Bld  Date/Time Value Ref Range Status  11/10/2020 02:49 AM 5.0 4.8 - 5.6 % Final    Comment:    (NOTE) Pre diabetes:          5.7%-6.4%  Diabetes:              >6.4%  Glycemic control for   <7.0% adults with diabetes   04/02/2020 05:48 AM 6.1 (H) 4.8 - 5.6 % Final    Comment:    REPEATED TO VERIFY (NOTE) Pre diabetes:          5.7%-6.4%  Diabetes:              >6.4%  Glycemic control for   <7.0% adults with diabetes    CBG: No results for input(s): GLUCAP in the last 168 hours.  Review of Systems:   unobtainable  Past Medical History:  She,  has a past medical history of Adenomatous colon polyp, Allergy, Anemia, Asthma, CAD (coronary artery disease), Carpal tunnel syndrome on left, Cataract, CHF (congestive heart failure) ([place]), Chronic kidney disease (CKD), stage III (moderate) ([place]), COPD (chronic obstructive pulmonary disease) ([place]), Dementia with behavioral disturbance (03/09/2020), Depression, Diabetes mellitus (1997), Diverticulosis, Dyspnea, Elevated diaphragm (November 2011), Esophageal dysmotility, Esophageal stricture, ESRD on hemodialysis ([place]) (03/09/2020), Gastritis, Gastroparesis (08/21/2007), GERD (gastroesophageal reflux disease), Hearing loss of both ears, Hernia, hiatal, Hyperlipidemia, Hypertension, Morbid obesity ([place]), OSTEOARTHRITIS (08/09/2006), Osteoporosis, Oxygen deficiency, PERIPHERAL NEUROPATHY, FEET (09/23/2007), RENAL INSUFFICIENCY (02/16/2009), Secondary pulmonary hypertension (03/07/2009), Seizures ([place]), Shingles, Sickle cell trait ([place]), Sleep apnea, and Stroke ([place]).   Surgical History:   Past Surgical History:  Procedure Laterality Date   ABDOMINAL HYSTERECTOMY     ARTERY BIOPSY  01/07/2011   Procedure: MINOR BIOPSY TEMPORAL ARTERY;  Surgeon: Haywood Lasso, MD;   Location: [facility];  Service: General;  Laterality: Left;  left temporal artery biopsy   AV FISTULA PLACEMENT Right 11/16/2018   Procedure: CREATION RIGHT BRACHIOCEPHALIC FISTULA  ARTERIOVENOUS FISTULA;  Surgeon: Angelia Mould, MD;  Location: [facility];  Service: Vascular;  Laterality: Right;   bil foot surgery     BREAST LUMPECTOMY  benign   BREAST LUMPECTOMY     both breast lumps removed    CATARACT EXTRACTION Left 06/2016   Dr. Read Drivers   COLONOSCOPY     COLONOSCOPY WITH PROPOFOL N/A 07/05/2015   Procedure: COLONOSCOPY WITH PROPOFOL;  Surgeon: Manus Gunning, MD;  Location: [facility];  Service: Gastroenterology;  Laterality: N/A;   COLONOSCOPY WITH PROPOFOL N/A 08/04/2018   Procedure: COLONOSCOPY WITH PROPOFOL;  Surgeon: Yetta Flock, MD;  Location: [facility];  Service: Gastroenterology;  Laterality: N/A;   ERD  08/08/2000   ESOPHAGEAL MANOMETRY N/A 03/13/2015   Procedure: ESOPHAGEAL MANOMETRY (EM);  Surgeon: Manus Gunning, MD;  Location: [facility];  Service: Gastroenterology;  Laterality: N/A;   ESOPHAGOGASTRODUODENOSCOPY  06/25/2006   ESOPHAGOGASTRODUODENOSCOPY (EGD) WITH PROPOFOL N/A 07/05/2015   Procedure: ESOPHAGOGASTRODUODENOSCOPY (EGD) WITH PROPOFOL;  Surgeon: Manus Gunning, MD;  Location: [facility];  Service: Gastroenterology;  Laterality: N/A;   FISTULA SUPERFICIALIZATION Right 11/16/2018   Procedure: Fistula Superficialization;  Surgeon: Angelia Mould, MD;  Location: [facility] OR;  Service: Vascular;  Laterality: Right;   HERNIA REPAIR     IR FLUORO GUIDE CV LINE LEFT  11/10/2020   IR PERC TUN PERIT CATH WO PORT S&I /IMAG  02/17/2020   IR US GUIDE VASC ACCESS LEFT  11/10/2020   IR US GUIDE VASC ACCESS RIGHT  02/18/2020   LEFT AND RIGHT HEART CATHETERIZATION WITH CORONARY ANGIOGRAM N/A 03/03/2013   Procedure: LEFT AND RIGHT HEART CATHETERIZATION WITH CORONARY ANGIOGRAM;  Surgeon: Minus Breeding, MD;  Location:  [facility] CATH LAB;  Service: Cardiovascular;  Laterality: N/A;   REPLACEMENT TOTAL KNEE Left 1998   UPPER GASTROINTESTINAL ENDOSCOPY      Social History:   reports that she quit smoking about 40 years ago. She has a 5.00 pack-year smoking history. She has never used smokeless tobacco. She reports that she does not drink alcohol and does not use drugs.   Family History:  Her family history includes Allergies in her sister; Cancer in her brother; Colon cancer in her brother; Diabetes in her mother, sister, and sister; Heart attack in her father; Heart disease in her mother, sister, and sister; Heart disease (age of onset: 16) in her father; Kidney disease in her mother, sister, and sister; Liver cancer in her mother; Sickle cell anemia in her father. There is no history of Esophageal cancer, Rectal cancer, Stomach cancer, Amblyopia, Blindness, Glaucoma, Macular degeneration, Retinal detachment, Cataracts, Strabismus, or Retinitis pigmentosa.   Allergies: Allergies  Allergen Reactions   Morphine And Related Other (See Comments)    Family request not to be given, reports pt does not wake up when given    Gabapentin Other (See Comments)    Pt is hard to wake up when this is given per sister    Oxycodone Other (See Comments)    Pt is hard to wake up when this is given per sister   Promethazine Hcl Other (See Comments)    lethargy    Home Medications: Prior to Admission medications   Medication Sig Start Date End Date Taking? Authorizing Provider  acetaminophen (TYLENOL) 325 MG tablet Take 2 tablets (650 mg total) by mouth every 6 (six) hours as needed for mild pain (or Fever >/= 101). Patient not taking: Reported on 12/14/2020 11/23/20   Nicole Kindred A, DO  apixaban (ELIQUIS) 5 MG TABS tablet Take 1 tablet (5 mg total) by mouth 2 (two) times daily. Start only after you have completed the starter pack. 01/19/21  Bonnielee Haff, MD  APIXABAN [place]) VTE STARTER PACK (10MG  AND 5MG ) Take as  directed on package: start with two-5mg  tablets twice daily for 7 days. On day 8, switch to one-5mg  tablet twice daily. 12/18/20   Bonnielee Haff, MD  haloperidol (HALDOL) 0.5 MG tablet Take 1 tablet (0.5 mg total) by mouth every 6 (six) hours as needed for agitation. Patient not taking: Reported on 12/14/2020 11/23/20   Ezekiel Slocumb, DO  Nutritional Supplements (NEPRO) LIQD Take 237 mLs by mouth 3 (three) times daily.    [provider]  ondansetron (ZOFRAN-ODT) 4 MG disintegrating tablet Take 1 tablet (4 mg total) by mouth every 6 (six) hours as needed for nausea. Patient not taking: Reported on 12/14/2020 11/23/20   Nicole Kindred A, DO  oxyCODONE (ROXICODONE INTENSOL) 20 MG/ML concentrated solution Place 0.3 mLs (6 mg total) under the tongue every 2 (two) hours as needed for moderate pain or severe pain (or dyspnea). Patient not taking: Reported on 12/14/2020 11/23/20   Nicole Kindred A, DO  OXYGEN Inhale 2 L into the lungs as needed. CPAP with oxygen at bedtime Patient not taking: Reported on 12/14/2020    [provider]  polyvinyl alcohol (LIQUIFILM TEARS) 1.4 % ophthalmic solution Place 1 drop into both eyes 4 (four) times daily as needed for dry eyes. Patient not taking: Reported on 12/14/2020 11/23/20   Ezekiel Slocumb, DO  senna (SENOKOT) 8.6 MG tablet Take 2 tablets (17.2 mg total) by mouth 2 (two) times daily. May crush, mix with water and give sublingually if needed. Patient not taking: Reported on 12/14/2020 11/23/20   Ezekiel Slocumb, DO    Critical care time: 50   Critical care time: The patient is critically ill with multiple organ systems failure and requires high complexity decision making for assessment and support, frequent evaluation and titration of therapies, application of advanced monitoring technologies and extensive interpretation of multiple databases.  Critical care time 49 mins. This represents my time independent of the NPs time taking care  of the pt. This is excluding procedures.    Audria Nine DO [facility] 01/19/2021, 6:48 PM See Amion for pager If no response to pager, please call [phone] until 1900 After 1900 please call [facility] [phone]

## 2020-12-26 NOTE — ED Triage Notes (Signed)
Per [facility] pt coming from home family called out for wound care for stage 4 pressure ulcer to sacrum. Patient was just discharged last week from hospital. Unsure of patients last dialysis treatment.

## 2020-12-26 NOTE — H&P (Addendum)
History and Physical    Joann Gutierrez ZOX:[id number] DOB: [birth year] DOA: 01/05/2021  PCP: Andree Moro, DO (Confirm with patient/family/NH records and if not entered, this has to be entered at [facility] point of entry) Patient coming from: Home  I have personally briefly reviewed patient's old medical records in [place]  Chief Complaint: Patient confused  HPI: Joann Gutierrez is a 75 y.o. female with medical history significant of ESRD on HD TTS, chronic sacral pressure ulcer, PE on Eliquis, COPD, OSA, chronic ambulation dysfunction wheelchair-bound, was boarding by family for worsening of sacral pressure ulcer and fever.  Patient confused not able to provide any history, most history provided by patient niece over the phone and sister at bedside.  Patient was recently hospitalized for hypoxia and PE, but after discharge, patient very much remained bedbound and inactive because of weakness.  Today, niece went to see the patient and found patient's sacral wound progressed and patient had a fever 101.2.  Patient also missed Saturday's dialysis last week because of the weakness.  Patient CODE STATUS has been changed several times during last few months from full code to DNR then back to full code again last admission.   ED Course: Patient was found septic with fever 100.8, lactic acid 7.0> 6.4, total of 1000 mL IV bolus given, blood pressure remains low.  No hypoxia.  She was started on vancomycin cefepime and Flagyl.  Review of Systems: Unable to perform, patient patient is confused.  Past Medical History:  Diagnosis Date   Adenomatous colon polyp    Allergy    Anemia    Asthma        CAD (coronary artery disease)    Mild very minimal coronary disease with 20% obtuse marginal stenosis   Carpal tunnel syndrome on left    Cataract    CHF (congestive heart failure) (HCC)    Chronic kidney disease (CKD), stage III (moderate) (HCC)    now stage 4   COPD (chronic obstructive  pulmonary disease) ([place])    Dementia with behavioral disturbance 03/09/2020   Depression    Diabetes mellitus 1997   Type II    Diverticulosis    Dyspnea    Elevated diaphragm November 2011   Right side   Esophageal dysmotility    Esophageal stricture    ESRD on hemodialysis ([place]) 03/09/2020   Gastritis    Gastroparesis 08/21/2007   GERD (gastroesophageal reflux disease)    Hearing loss of both ears    Hernia, hiatal    Hyperlipidemia    Hypertension    Morbid obesity ([place])    OSTEOARTHRITIS 08/09/2006   Osteoporosis    Oxygen deficiency    prn    PERIPHERAL NEUROPATHY, FEET 09/23/2007   RENAL INSUFFICIENCY 02/16/2009   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   Seizures ([place])    pt thinks it has been several monthes since she had a seisure   Shingles    Sickle cell trait ([place])    Sleep apnea    uses cpap   Stroke [facility])     Past Surgical History:  Procedure Laterality Date   ABDOMINAL HYSTERECTOMY     ARTERY BIOPSY  01/07/2011   Procedure: MINOR BIOPSY TEMPORAL ARTERY;  Surgeon: Haywood Lasso, MD;  Location: [place];  Service: General;  Laterality: Left;  left temporal artery biopsy   AV FISTULA PLACEMENT Right 11/16/2018   Procedure: CREATION RIGHT BRACHIOCEPHALIC FISTULA  ARTERIOVENOUS FISTULA;  Surgeon: Angelia Mould, MD;  Location: [facility] OR;  Service: Vascular;  Laterality: Right;   bil foot surgery     BREAST LUMPECTOMY     benign   BREAST LUMPECTOMY     both breast lumps removed    CATARACT EXTRACTION Left 06/2016   Dr. Read Drivers   COLONOSCOPY     COLONOSCOPY WITH PROPOFOL N/A 07/05/2015   Procedure: COLONOSCOPY WITH PROPOFOL;  Surgeon: Manus Gunning, MD;  Location: [facility];  Service: Gastroenterology;  Laterality: N/A;   COLONOSCOPY WITH PROPOFOL N/A 08/04/2018   Procedure: COLONOSCOPY WITH PROPOFOL;  Surgeon: Yetta Flock, MD;  Location: [facility];  Service: Gastroenterology;  Laterality: N/A;   ERD   08/08/2000   ESOPHAGEAL MANOMETRY N/A 03/13/2015   Procedure: ESOPHAGEAL MANOMETRY (EM);  Surgeon: Manus Gunning, MD;  Location: [facility];  Service: Gastroenterology;  Laterality: N/A;   ESOPHAGOGASTRODUODENOSCOPY  06/25/2006   ESOPHAGOGASTRODUODENOSCOPY (EGD) WITH PROPOFOL N/A 07/05/2015   Procedure: ESOPHAGOGASTRODUODENOSCOPY (EGD) WITH PROPOFOL;  Surgeon: Manus Gunning, MD;  Location: [facility];  Service: Gastroenterology;  Laterality: N/A;   FISTULA SUPERFICIALIZATION Right 11/16/2018   Procedure: Fistula Superficialization;  Surgeon: Angelia Mould, MD;  Location[facility] OR;  Service: Vascular;  Laterality: Right;   HERNIA REPAIR     IR FLUORO GUIDE CV LINE LEFT  11/10/2020   IR PERC TUN PERIT CATH WO PORT S&I /IMAG  02/17/2020   IR US GUIDE VASC ACCESS LEFT  11/10/2020   IR US GUIDE VASC ACCESS RIGHT  02/18/2020   LEFT AND RIGHT HEART CATHETERIZATION WITH CORONARY ANGIOGRAM N/A 03/03/2013   Procedure: LEFT AND RIGHT HEART CATHETERIZATION WITH CORONARY ANGIOGRAM;  Surgeon: Minus Breeding, MD;  Location: [facility] CATH LAB;  Service: Cardiovascular;  Laterality: N/A;   REPLACEMENT TOTAL KNEE Left 1998   UPPER GASTROINTESTINAL ENDOSCOPY       reports that she quit smoking about 40 years ago. She has a 5.00 pack-year smoking history. She has never used smokeless tobacco. She reports that she does not drink alcohol and does not use drugs.  Allergies  Allergen Reactions   Morphine And Related Other (See Comments)    Family request not to be given, reports pt does not wake up when given    Gabapentin Other (See Comments)    Pt is hard to wake up when this is given per sister    Oxycodone Other (See Comments)    Pt is hard to wake up when this is given per sister   Promethazine Hcl Other (See Comments)    lethargy    Family History  Problem Relation Age of Onset   Liver cancer Mother        Liver Cancer   Diabetes Mother    Kidney disease Mother    Heart disease  Mother        age 71's   Heart disease Father 32       MI   Heart attack Father        died of MI when pt was 5   Sickle cell anemia Father    Colon cancer Brother    Cancer Brother        Colon Cancer   Diabetes Sister    Kidney disease Sister    Heart disease Sister        age 65's   Allergies Sister    Diabetes Sister    Kidney disease Sister    Heart disease Sister        age 45's  Esophageal cancer Neg Hx    Rectal cancer Neg Hx    Stomach cancer Neg Hx    Amblyopia Neg Hx    Blindness Neg Hx    Glaucoma Neg Hx    Macular degeneration Neg Hx    Retinal detachment Neg Hx    Cataracts Neg Hx    Strabismus Neg Hx    Retinitis pigmentosa Neg Hx      Prior to Admission medications   Medication Sig Start Date End Date Taking? Authorizing Provider  acetaminophen (TYLENOL) 325 MG tablet Take 2 tablets (650 mg total) by mouth every 6 (six) hours as needed for mild pain (or Fever >/= 101). Patient not taking: Reported on 12/14/2020 11/23/20   Nicole Kindred A, DO  apixaban (ELIQUIS) 5 MG TABS tablet Take 1 tablet (5 mg total) by mouth 2 (two) times daily. Start only after you have completed the starter pack. 01/19/21   Bonnielee Haff, MD  APIXABAN [place]) VTE STARTER PACK (10MG  AND 5MG ) Take as directed on package: start with two-5mg  tablets twice daily for 7 days. On day 8, switch to one-5mg  tablet twice daily. 12/18/20   Bonnielee Haff, MD  haloperidol (HALDOL) 0.5 MG tablet Take 1 tablet (0.5 mg total) by mouth every 6 (six) hours as needed for agitation. Patient not taking: Reported on 12/14/2020 11/23/20   Ezekiel Slocumb, DO  Nutritional Supplements (NEPRO) LIQD Take 237 mLs by mouth 3 (three) times daily.    [provider]  ondansetron (ZOFRAN-ODT) 4 MG disintegrating tablet Take 1 tablet (4 mg total) by mouth every 6 (six) hours as needed for nausea. Patient not taking: Reported on 12/14/2020 11/23/20   Nicole Kindred A, DO  oxyCODONE (ROXICODONE  INTENSOL) 20 MG/ML concentrated solution Place 0.3 mLs (6 mg total) under the tongue every 2 (two) hours as needed for moderate pain or severe pain (or dyspnea). Patient not taking: Reported on 12/14/2020 11/23/20   Nicole Kindred A, DO  OXYGEN Inhale 2 L into the lungs as needed. CPAP with oxygen at bedtime Patient not taking: Reported on 12/14/2020    [provider]  polyvinyl alcohol (LIQUIFILM TEARS) 1.4 % ophthalmic solution Place 1 drop into both eyes 4 (four) times daily as needed for dry eyes. Patient not taking: Reported on 12/14/2020 11/23/20   Ezekiel Slocumb, DO  senna (SENOKOT) 8.6 MG tablet Take 2 tablets (17.2 mg total) by mouth 2 (two) times daily. May crush, mix with water and give sublingually if needed. Patient not taking: Reported on 12/14/2020 11/23/20   Ezekiel Slocumb, DO    Physical Exam: Vitals:   12/27/2020 1745 12/30/2020 1800 01/03/2021 1815 01/01/2021 1830  BP: (!) 102/58 (!) 98/49 (!) 96/47 (!) 85/48  Pulse:    (!) 110  Resp: (!) 23 (!) 26 (!) 23 (!) 21  Temp:      TempSrc:      SpO2:    100%  Weight:      Height:        Constitutional: NAD, calm, comfortable Vitals:   12/28/2020 1745 01/12/2021 1800 12/23/2020 1815 01/04/2021 1830  BP: (!) 102/58 (!) 98/49 (!) 96/47 (!) 85/48  Pulse:    (!) 110  Resp: (!) 23 (!) 26 (!) 23 (!) 21  Temp:      TempSrc:      SpO2:    100%  Weight:      Height:       Eyes: PERRL, lids and conjunctivae normal ENMT: Mucous  membranes are dry. Posterior pharynx clear of any exudate or lesions.Normal dentition.  Neck: normal, supple, no masses, no thyromegaly Respiratory: clear to auscultation bilaterally, no wheezing, no crackles. Normal respiratory effort. No accessory muscle use.  Cardiovascular: Regular rate and rhythm, no murmurs / rubs / gallops. No extremity edema. 2+ pedal pulses. No carotid bruits.  Abdomen: no tenderness, no masses palpated. No hepatosplenomegaly. Bowel sounds positive.  Musculoskeletal: no clubbing  / cyanosis. No joint deformity upper and lower extremities. Good ROM, no contractures. Normal muscle tone.  Skin: Large sacral wound on the right side, with bone exposed. Neurologic: No facial droops, moving all limbs, not following command. Psychiatric: Lethargic, opens eyes confused    Labs on Admission: I have personally reviewed following labs and imaging studies  CBC: Recent Labs  Lab 12/22/2020 1503  WBC 50.6*  NEUTROABS 39.5*  HGB 8.6*  HCT 26.6*  MCV 79.2*  PLT 354   Basic Metabolic Panel: Recent Labs  Lab 01/02/2021 1503  NA 141  K 4.6  CL 101  CO2 17*  GLUCOSE 333*  BUN 62*  CREATININE 9.70*  CALCIUM 9.1   GFR: Estimated Creatinine Clearance: 4.9 mL/min (A) (by C-G formula based on SCr of 9.7 mg/dL (H)). Liver Function Tests: Recent Labs  Lab 12/22/2020 1503  AST 19  ALT 14  ALKPHOS 177*  BILITOT 1.1  PROT 6.8  ALBUMIN 1.7*   No results for input(s): LIPASE, AMYLASE in the last 168 hours. No results for input(s): AMMONIA in the last 168 hours. Coagulation Profile: Recent Labs  Lab 01/08/2021 1503  INR 1.5*   Cardiac Enzymes: No results for input(s): CKTOTAL, CKMB, CKMBINDEX, TROPONINI in the last 168 hours. BNP (last 3 results) No results for input(s): PROBNP in the last 8760 hours. HbA1C: No results for input(s): HGBA1C in the last 72 hours. CBG: No results for input(s): GLUCAP in the last 168 hours. Lipid Profile: No results for input(s): CHOL, HDL, LDLCALC, TRIG, CHOLHDL, LDLDIRECT in the last 72 hours. Thyroid Function Tests: No results for input(s): TSH, T4TOTAL, FREET4, T3FREE, THYROIDAB in the last 72 hours. Anemia Panel: No results for input(s): VITAMINB12, FOLATE, FERRITIN, TIBC, IRON, RETICCTPCT in the last 72 hours. Urine analysis:    Component Value Date/Time   COLORURINE AMBER (A) 03/09/2020 0645   APPEARANCEUR CLOUDY (A) 03/09/2020 0645   LABSPEC 1.020 03/09/2020 0645   PHURINE 5.0 03/09/2020 0645   GLUCOSEU NEGATIVE  03/09/2020 0645   GLUCOSEU >=1000 01/02/2009 1101   HGBUR NEGATIVE 03/09/2020 0645   HGBUR negative 03/25/2008 1022   BILIRUBINUR NEGATIVE 03/09/2020 0645   KETONESUR NEGATIVE 03/09/2020 0645   PROTEINUR 100 (A) 03/09/2020 0645   UROBILINOGEN 0.2 09/14/2013 1632   NITRITE NEGATIVE 03/09/2020 0645   LEUKOCYTESUR TRACE (A) 03/09/2020 0645    Radiological Exams on Admission: DG Chest Port 1 View  Result Date: 12/28/2020 CLINICAL DATA:  Possible sepsis. EXAM: PORTABLE CHEST 1 VIEW COMPARISON:  CT a chest in chest x-ray dated December 13, 2020. FINDINGS: Unchanged tunneled right internal jugular dialysis catheter with tip in the inferior right atrium. The heart size and mediastinal contours are within normal limits. Normal pulmonary vascularity. No focal consolidation, pleural effusion, or pneumothorax. Unchanged elevation of the right hemidiaphragm. No acute osseous abnormality. IMPRESSION: 1. No active disease. Electronically Signed   By: Titus Dubin M.D.   On: 12/24/2020 15:11    EKG: Independently reviewed.  Sinus tachycardia, no acute ST changes  Assessment/Plan Principal Problem:   Sepsis ([place]) Active Problems:  Severe sepsis ([place])  (please populate well all problems here in Problem List. (For example, if patient is on BP meds at home and you resume or decide to hold them, it is a problem that needs to be her. 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  OR debridement prohibitive given patient's blood pressure and overall  conditions.  Discussed with ED physician, PCCM physician and nephrology, all recommend conservative management for now, and switching to palliative care as soon as family agree.  Son agreed with organizing a family meeting tonight involving niece and patient's sister, hopefully can reach a decision about goals of care tomorrow.  Family share power of attorney between patient's son and sister, both insisted on Full code for now. But son agreed with palliative care for now. Full Code as per son for now. -Discussed with nephrology, will start bicarb drip and albumin every 6 hours. -PCCM evaluated patient and recommend patient admitted to PCU for tonight.  Severe protein calorie malnutrition -Patient had 20 kg weight loss since May this year, 63 kg>73kg -Prognosis grave, expect life expectancy few weeks.  ESRD on HD -Volume depleted, no overt hyperkalemia, uremia, no dialysis tonight.  PE -Heparin drip  DVT prophylaxis: Heparin drip Code Status: Full code Family Communication: Sister, son and niece Disposition Plan: Gravely sick, expect 3 to 5 days hospital stay, expect bridging for hospice. Consults called: Exam, nephrology Admission status: PCU   Lequita Halt MD [facility] Pager [phone] 01/19/2021, 8:27 PM

## 2020-12-26 NOTE — Progress Notes (Signed)
Pharmacy Antibiotic Note  Joann Gutierrez is a 75 y.o. female admitted on 01/14/2021 with sepsis.  Pharmacy has been consulted for Cefepime and vancomycin dosing. Labs pending   Of note patient has a h/o ESRD on TTS HD. Unsure when last HD session was.   Plan: -Cefepime 2 gm IV load followed by cefepime 1 gm IV Q 24 hours -Vancomycin 1500 mg IV load followed by vancomycin 750 mg IV with each HD session -Monitor CBC, cultures and clinical progress -Will f/u HD schedule and order vancomycin dose accordingly.      Temp (24hrs), Avg:100.8 F (38.2 C), Min:100.8 F (38.2 C), Max:100.8 F (38.2 C)  No results for input(s): WBC, CREATININE, LATICACIDVEN, VANCOTROUGH, VANCOPEAK, VANCORANDOM, GENTTROUGH, GENTPEAK, GENTRANDOM, TOBRATROUGH, TOBRAPEAK, TOBRARND, AMIKACINPEAK, AMIKACINTROU, AMIKACIN in the last 168 hours.  Estimated Creatinine Clearance: 5.5 mL/min (A) (by C-G formula based on SCr of 8.72 mg/dL (H)).    Allergies  Allergen Reactions   Morphine And Related Other (See Comments)    Family request not to be given, reports pt does not wake up when given    Gabapentin Other (See Comments)    Pt is hard to wake up when this is given per sister    Oxycodone Other (See Comments)    Pt is hard to wake up when this is given per sister   Promethazine Hcl Other (See Comments)    lethargy    Antimicrobials this admission: Cefepime 12/6 >>  Vancomycin 12/6 >>   Dose adjustments this admission:   Microbiology results: 12/6 BCx:    Thank you for allowing pharmacy to be a part of this patient's care.  Albertina Parr, PharmD., BCPS, BCCCP Clinical Pharmacist Please refer to [facility] for unit-specific pharmacist

## 2020-12-26 NOTE — ED Notes (Signed)
RN to room to round on patient. Pt sister at bedside. Pt sister is POA and wants sister transferred to [facility] medical [facility]. RN informed patient sister that matter is best resolved with physicians who'd have to be willing to accept patient at [place] sister stated that she's upset with care that patient received while at [facility] previously. Pt sister states she wants to contact patient advocacy and media concerning poor visits at [facility]. RN made hospitalist, J[place], aware of patient POA's request. Hospistalist stated that family previously agreed for patient to stay overnight and to be evaluated the next day. RN reiterated the message from MD to patient POA.

## 2020-12-26 NOTE — ED Provider Notes (Signed)
[facility] EMERGENCY DEPARTMENT Provider Note   CSN: [id number] Arrival date & time: 01/14/2021  1427     History No chief complaint on file.   Joann Gutierrez is a 75 y.o. female.  75 year old female with prior medical history as detailed below presents for evaluation.  Patient with complicated recent history.  Patient's family sent her to the ED for evaluation of increased drainage from her stage IV sacral decubitus.  Patient is nonverbal at baseline.  Level 5 caveat secondary to same.  Patient appears to be an active dialysis patient.  Patient is noted to be febrile on arrival with a temperature of 100.8.  Discharge summary from December 18, 2020 reviewed.  The history is provided by the patient, medical records and the EMS personnel.  Illness Location:  Increased drainage from sacral wound Severity:  Moderate Onset quality:  Unable to specify Timing:  Unable to specify Progression:  Unable to specify     Past Medical History:  Diagnosis Date   Adenomatous colon polyp    Allergy    Anemia    Asthma        CAD (coronary artery disease)    Mild very minimal coronary disease with 20% obtuse marginal stenosis   Carpal tunnel syndrome on left    Cataract    CHF (congestive heart failure) (HCC)    Chronic kidney disease (CKD), stage III (moderate) (HCC)    now stage 4   COPD (chronic obstructive pulmonary disease) ([place])    Dementia with behavioral disturbance 03/09/2020   Depression    Diabetes mellitus 1997   Type II    Diverticulosis    Dyspnea    Elevated diaphragm November 2011   Right side   Esophageal dysmotility    Esophageal stricture    ESRD on hemodialysis ([place]) 03/09/2020   Gastritis    Gastroparesis 08/21/2007   GERD (gastroesophageal reflux disease)    Hearing loss of both ears    Hernia, hiatal    Hyperlipidemia    Hypertension    Morbid obesity ([place])    OSTEOARTHRITIS 08/09/2006   Osteoporosis    Oxygen deficiency     prn    PERIPHERAL NEUROPATHY, FEET 09/23/2007   RENAL INSUFFICIENCY 02/16/2009   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   Seizures ([place])    pt thinks it has been several monthes since she had a seisure   Shingles    Sickle cell trait ([place])    Sleep apnea    uses cpap   Stroke [facility])     Patient Active Problem List   Diagnosis Date Noted   Acute pulmonary embolism without acute cor pulmonale ([place]) 12/14/2020   Pulmonary embolism ([place]) 12/14/2020   Hospice care patient 11/23/2020   Lactic acidosis    Pressure injury of skin 11/12/2020   Syncope 11/10/2020   Hypotension 11/10/2020   Hypoglycemia 11/10/2020   Severe sepsis ([place]) 11/09/2020   Delirium 04/10/2020   Adult failure to thrive 04/10/2020   Acute bilat watershed infarction [facility]) 03/31/2020   AMS (altered mental status) 00/17/4944   Acute metabolic encephalopathy 96/75/9163   ESRD on hemodialysis ([place]) 03/09/2020   Thrombocytopenia ([place]) 03/09/2020   Dementia with behavioral disturbance 03/09/2020   COVID-19    SOB (shortness of breath)    Type 2 diabetes mellitus with hyperlipidemia ([place])    Sepsis ([place]) 02/16/2020   Acute on chronic respiratory failure ([place]) 02/16/2020   Edema of left lower leg 02/16/2020   Pneumonia due to  COVID-19 virus 02/15/2020   Acute on chronic diastolic HF (heart failure) ([place]) 11/18/2019   Dyslipidemia 11/18/2019   Polypharmacy 04/13/2019   Counseling regarding advance care planning and goals of care 10/15/2017   ARF (acute renal failure) ([place]) 09/07/2017   Chest pain 09/05/2017   Ineffective health maintenance 06/26/2017   Medication management 06/26/2017   Retinal edema 10/07/2016   Postherpetic neuralgia 12/21/2015   Herpes zoster with complication 81/15/7262   Cellulitis of face 11/27/2015   Anemia of chronic disease 08/17/2015   History of colonic polyps    Hypertensive emergency 05/20/2015   Hypokalemia 05/20/2015   AKI (acute kidney injury) ([place])    Esophageal dysmotility  03/01/2015   Throat congestion 10/11/2014   Morbid obesity due to excess calories ([place]) 09/23/2014   Acute asthmatic bronchitis 05/20/2014   Abdominal pain, other specified site 09/13/2013   Elevated liver enzymes 09/13/2013   Cough 05/05/2013   Pain in lower limb 03/05/2013   DOE (dyspnea on exertion) 02/03/2013   Chronic respiratory failure ([place]) 12/13/2012   Stricture and stenosis of esophagus 08/31/2012   Onychomycosis 06/01/2012   Pain in joint, ankle and foot 06/01/2012   Diabetes mellitus type 2 in obese ([place]) 04/29/2012   Essential hypertension 07/19/2011   CKD (chronic kidney disease), stage III ([place]) 05/19/2011   Family history of malignant neoplasm of gastrointestinal tract 10/30/2010   Bloating 10/16/2010   Epigastric pain 10/16/2010   Foot pain 09/25/2010   Dysphagia 07/16/2010   GERD (gastroesophageal reflux disease) 07/16/2010   Microcytic anemia 07/16/2010   Obstructive sleep apnea 06/16/2009   HYPERCHOLESTEROLEMIA 02/16/2009   PERIPHERAL NEUROPATHY, FEET 09/23/2007   Gastroparesis 08/21/2007   COPD (chronic obstructive pulmonary disease) ([place]) 08/09/2006    Past Surgical History:  Procedure Laterality Date   ABDOMINAL HYSTERECTOMY     ARTERY BIOPSY  01/07/2011   Procedure: MINOR BIOPSY TEMPORAL ARTERY;  Surgeon: Haywood Lasso, MD;  Location: [facility];  Service: General;  Laterality: Left;  left temporal artery biopsy   AV FISTULA PLACEMENT Right 11/16/2018   Procedure: CREATION RIGHT BRACHIOCEPHALIC FISTULA  ARTERIOVENOUS FISTULA;  Surgeon: Angelia Mould, MD;  Location: [facility] OR;  Service: Vascular;  Laterality: Right;   bil foot surgery     BREAST LUMPECTOMY     benign   BREAST LUMPECTOMY     both breast lumps removed    CATARACT EXTRACTION Left 06/2016   Dr. Read Drivers   COLONOSCOPY     COLONOSCOPY WITH PROPOFOL N/A 07/05/2015   Procedure: COLONOSCOPY WITH PROPOFOL;  Surgeon: Manus Gunning, MD;  [facility]: [facility];  Service: Gastroenterology;  Laterality: N/A;   COLONOSCOPY WITH PROPOFOL N/A 08/04/2018   Procedure: COLONOSCOPY WITH PROPOFOL;  Surgeon: Yetta Flock, MD;  Location: [facility] ENDOSCOPY;  Service: Gastroenterology;  Laterality: N/A;   ERD  08/08/2000   ESOPHAGEAL MANOMETRY N/A 03/13/2015   Procedure: ESOPHAGEAL MANOMETRY (EM);  Surgeon: Manus Gunning, MD;  Location: [facility] ENDOSCOPY;  Service: Gastroenterology;  Laterality: N/A;   ESOPHAGOGASTRODUODENOSCOPY  06/25/2006   ESOPHAGOGASTRODUODENOSCOPY (EGD) WITH PROPOFOL N/A 07/05/2015   Procedure: ESOPHAGOGASTRODUODENOSCOPY (EGD) WITH PROPOFOL;  Surgeon: Manus Gunning, MD;  Location: [facility] ENDOSCOPY;  Service: Gastroenterology;  Laterality: N/A;   FISTULA SUPERFICIALIZATION Right 11/16/2018   Procedure: Fistula Superficialization;  Surgeon: Angelia Mould, MD;  Location[facility];  Service: Vascular;  Laterality: Right;   HERNIA REPAIR     IR FLUORO GUIDE CV LINE LEFT  11/10/2020   IR PERC TUN  PERIT CATH WO PORT S&I /IMAG  02/17/2020   IR US GUIDE VASC ACCESS LEFT  11/10/2020   IR US GUIDE VASC ACCESS RIGHT  02/18/2020   LEFT AND RIGHT HEART CATHETERIZATION WITH CORONARY ANGIOGRAM N/A 03/03/2013   Procedure: LEFT AND RIGHT HEART CATHETERIZATION WITH CORONARY ANGIOGRAM;  Surgeon: Minus Breeding, MD;  Location[facility] CATH LAB;  Service: Cardiovascular;  Laterality: N/A;   REPLACEMENT TOTAL KNEE Left 1998   UPPER GASTROINTESTINAL ENDOSCOPY       OB History   No obstetric history on file.     Family History  Problem Relation Age of Onset   Liver cancer Mother        Liver Cancer   Diabetes Mother    Kidney disease Mother    Heart disease Mother        age 45's   Heart disease Father 90       MI   Heart attack Father        died of MI when pt was 5   Sickle cell anemia Father    Colon cancer Brother    Cancer Brother        Colon Cancer   Diabetes Sister    Kidney disease Sister    Heart disease Sister         age 4's   Allergies Sister    Diabetes Sister    Kidney disease Sister    Heart disease Sister        age 12's   Esophageal cancer Neg Hx    Rectal cancer Neg Hx    Stomach cancer Neg Hx    Amblyopia Neg Hx    Blindness Neg Hx    Glaucoma Neg Hx    Macular degeneration Neg Hx    Retinal detachment Neg Hx    Cataracts Neg Hx    Strabismus Neg Hx    Retinitis pigmentosa Neg Hx     Social History   Tobacco Use   Smoking status: Former    Packs/day: 0.50    Years: 10.00    Pack years: 5.00    Types: Cigarettes    Quit date: 04/18/1980    Years since quitting: 40.7   Smokeless tobacco: Never  Vaping Use   Vaping Use: Never used  Substance Use Topics   Alcohol use: No    Alcohol/week: 0.0 standard drinks   Drug use: No    Home Medications Prior to Admission medications   Medication Sig Start Date End Date Taking? Authorizing Provider  acetaminophen (TYLENOL) 325 MG tablet Take 2 tablets (650 mg total) by mouth every 6 (six) hours as needed for mild pain (or Fever >/= 101). Patient not taking: Reported on 12/14/2020 11/23/20   Nicole Kindred A, DO  apixaban (ELIQUIS) 5 MG TABS tablet Take 1 tablet (5 mg total) by mouth 2 (two) times daily. Start only after you have completed the starter pack. 01/19/21   Bonnielee Haff, MD  APIXABAN [place] [place]) VTE STARTER PACK (10MG  AND 5MG ) Take as directed on package: start with two-5mg  tablets twice daily for 7 days. On day 8, switch to one-5mg  tablet twice daily. 12/18/20   Bonnielee Haff, MD  haloperidol (HALDOL) 0.5 MG tablet Take 1 tablet (0.5 mg total) by mouth every 6 (six) hours as needed for agitation. Patient not taking: Reported on 12/14/2020 11/23/20   Ezekiel Slocumb, DO  Nutritional Supplements (NEPRO) LIQD Take 237 mLs by mouth 3 (three) times daily.    [provider]  ondansetron (ZOFRAN-ODT) 4 MG disintegrating tablet Take 1 tablet (4 mg total) by mouth every 6 (six) hours as needed for nausea. Patient not  taking: Reported on 12/14/2020 11/23/20   Nicole Kindred A, DO  oxyCODONE (ROXICODONE INTENSOL) 20 MG/ML concentrated solution Place 0.3 mLs (6 mg total) under the tongue every 2 (two) hours as needed for moderate pain or severe pain (or dyspnea). Patient not taking: Reported on 12/14/2020 11/23/20   Nicole Kindred A, DO  OXYGEN Inhale 2 L into the lungs as needed. CPAP with oxygen at bedtime Patient not taking: Reported on 12/14/2020    [provider]  polyvinyl alcohol (LIQUIFILM TEARS) 1.4 % ophthalmic solution Place 1 drop into both eyes 4 (four) times daily as needed for dry eyes. Patient not taking: Reported on 12/14/2020 11/23/20   Ezekiel Slocumb, DO  senna (SENOKOT) 8.6 MG tablet Take 2 tablets (17.2 mg total) by mouth 2 (two) times daily. May crush, mix with water and give sublingually if needed. Patient not taking: Reported on 12/14/2020 11/23/20   Nicole Kindred A, DO    Allergies    Morphine and related, Gabapentin, Oxycodone, and Promethazine hcl  Review of Systems   Review of Systems  All other systems reviewed and are negative.  Physical Exam Updated Vital Signs BP 105/63   Pulse (!) 130   Temp (!) 100.8 F (38.2 C) (Rectal)   Resp 12   SpO2 98%   Physical Exam Vitals and nursing note reviewed.  Constitutional:      General: She is not in acute distress.    Appearance: Normal appearance. She is well-developed.  HENT:     Head: Normocephalic and atraumatic.  Eyes:     Conjunctiva/sclera: Conjunctivae normal.     Pupils: Pupils are equal, round, and reactive to light.  Cardiovascular:     Rate and Rhythm: Normal rate and regular rhythm.     Heart sounds: Normal heart sounds.  Pulmonary:     Effort: Pulmonary effort is normal. No respiratory distress.     Breath sounds: Normal breath sounds.  Abdominal:     General: There is no distension.     Palpations: Abdomen is soft.     Tenderness: There is no abdominal tenderness.  Musculoskeletal:         General: No deformity. Normal range of motion.     Cervical back: Normal range of motion and neck supple.  Skin:    General: Skin is warm and dry.     Comments: Sacral decubitus wound as imaged below.  Neurological:     General: No focal deficit present.     Mental Status: She is alert and oriented to person, place, and time.       ED Results / Procedures / Treatments   Labs (all labs ordered are listed, but only abnormal results are displayed) Labs Reviewed  CULTURE, BLOOD (ROUTINE X 2)  CULTURE, BLOOD (ROUTINE X 2)  URINE CULTURE  RESP PANEL BY RT-PCR (FLU A&B, COVID) ARPGX2  LACTIC ACID, PLASMA  LACTIC ACID, PLASMA  COMPREHENSIVE METABOLIC PANEL  CBC WITH DIFFERENTIAL/PLATELET  PROTIME-INR  APTT  URINALYSIS, ROUTINE W REFLEX MICROSCOPIC    EKG None  Radiology No results found.  Procedures Procedures   Medications Ordered in ED Medications  ceFEPIme (MAXIPIME) 2 g in sodium chloride 0.9 % 100 mL IVPB (has no administration in time range)  vancomycin (VANCOREADY) IVPB 1500 mg/300 mL (has no administration in time range)  ED Course  I have reviewed the triage vital signs and the nursing notes.  Pertinent labs & imaging results that were available during my care of the patient were reviewed by me and considered in my medical decision making (see chart for details).    MDM Rules/Calculators/A&P                           MDM  MSE complete  Joann Gutierrez was evaluated in Emergency Department on 12/25/2020 for the symptoms described in the history of present illness. She was evaluated in the context of the global COVID-19 pandemic, which necessitated consideration that the patient might be at risk for infection with the SARS-CoV-2 virus that causes COVID-19. Institutional protocols and algorithms that pertain to the evaluation of patients at risk for COVID-19 are in a state of rapid change based on information released by regulatory bodies including the  CDC and federal and state organizations. These policies and algorithms were followed during the patient's care in the ED.  Presentation of the patient is concerning for likely infection with possible early sepsis.  Broad-spectrum antibiotics initiated in the ED.  Family of patient aware of poor prognosis of patient.  Given patient's significant comorbidities and apparent full CODE STATUS Case discussed with critical care.  Critical care does not feel that the patient requires their service at this time.  Hospitalist service will admit.   Final Clinical Impression(s) / ED Diagnoses Final diagnoses:  Fever, unspecified fever cause    Rx / DC Orders ED Discharge Orders     None        Valarie Merino, MD 01/20/2021 1920

## 2020-12-26 NOTE — Progress Notes (Signed)
ANTICOAGULATION CONSULT NOTE - Initial Consult  Pharmacy Consult for heparin Indication: pulmonary embolus  Allergies  Allergen Reactions   Morphine And Related Other (See Comments)    Family request not to be given, reports pt does not wake up when given    Gabapentin Other (See Comments)    Pt is hard to wake up when this is given per sister    Oxycodone Other (See Comments)    Pt is hard to wake up when this is given per sister   Promethazine Hcl Other (See Comments)    lethargy    Patient Measurements: Height: 5\' 4"  (162.6 cm) Weight: 73 kg (160 lb 15 oz) IBW/kg (Calculated) : 54.7 Heparin Dosing Weight: 73 kg   Vital Signs: Temp: 98 F (36.7 C) (12/06 1605) Temp Source: Temporal (12/06 1605) BP: 77/43 (12/06 2015) Pulse Rate: 110 (12/06 1830)  Labs: Recent Labs    12/24/2020 1503  HGB 8.6*  HCT 26.6*  PLT 170  APTT 35  LABPROT 18.1*  INR 1.5*  CREATININE 9.70*    Estimated Creatinine Clearance: 4.9 mL/min (A) (by C-G formula based on SCr of 9.7 mg/dL (H)).   Medical History: Past Medical History:  Diagnosis Date   Adenomatous colon polyp    Allergy    Anemia    Asthma        CAD (coronary artery disease)    Mild very minimal coronary disease with 20% obtuse marginal stenosis   Carpal tunnel syndrome on left    Cataract    CHF (congestive heart failure) (HCC)    Chronic kidney disease (CKD), stage III (moderate) (HCC)    now stage 4   COPD (chronic obstructive pulmonary disease) ([place])    Dementia with behavioral disturbance 03/09/2020   Depression    Diabetes mellitus 1997   Type II    Diverticulosis    Dyspnea    Elevated diaphragm November 2011   Right side   Esophageal dysmotility    Esophageal stricture    ESRD on hemodialysis ([place]) 03/09/2020   Gastritis    Gastroparesis 08/21/2007   GERD (gastroesophageal reflux disease)    Hearing loss of both ears    Hernia, hiatal    Hyperlipidemia    Hypertension    Morbid obesity ([place])     OSTEOARTHRITIS 08/09/2006   Osteoporosis    Oxygen deficiency    prn    PERIPHERAL NEUROPATHY, FEET 09/23/2007   RENAL INSUFFICIENCY 02/16/2009   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 Pharmacy consulted to transition to heparin. Of note, patient has h/o ESRD on HD.   H/H low, Plt wnl  Goal of Therapy:  Heparin level 0.3-0.7 units/ml aPTT 66-102 seconds Monitor platelets by anticoagulation protocol: Yes   Plan:  -Start IV heparin at 1250 units/hr  -F/u 6 hr aPTT/HL -Monitor daily aPTT, HL, CBC and s/s of bleeding  Albertina Parr, PharmD., BCPS, BCCCP Clinical Pharmacist Please refer to [facility] for unit-specific pharmacist

## 2020-12-26 NOTE — Consult Note (Signed)
Reason for Consult: ESRD Referring Physician: Dr. Audria Nine  Chief Complaint:  Increased sacral wound drainage   Dialysis Orders: [facility]  on TTS . 180NRe, 4 hours, BFR 400, DFR 500, EDW 73.5 kg 4K, 2.25Ca, TDC (left at 70.7kg last HD) Heparin 4000 unit bolus Mircera 26mcg IV q 2 weeks- last dose 12/21/20 Hectorol 26mcg IV q HD  Assessment/Plan:   ESRD: Family transition to comfort care at one point Oct but was  rescinded but she has steadily been declining with also very truncated HD treatments as an outpatient, missing Saturday's treatment as well.   She will not tolerate conventional iHD and I would favor stopping RRT with palliative care consult for [facility] given her steady decline, multiple comorbidities including advanced dementia, ESRD, recent pulmonary emboli, open draining sacral wound, poor nutritional status and poor QOL. Fortunately no absolute indication for RRT and would hold for now pending family discussions + palliative for [place]. Sepsis: started on broad spectrum abx; agree that if intubated it is unlikely she would ever be extubated and prognosis very poor.  Hypotension/volume: BP variable, soft to mildly elevated.  Does not appear to have significant volume overload on chest CT or exam.   Anemia: hgb 8.6; transfuse as needed. Will not respond to  ESA with sepsis and inflammatory state.  Metabolic bone disease: No meds for now. H/O PE left lower and upper segments.   Nutrition: Very malnourished.   HPI: Joann Gutierrez is an 75 y.o. female COPD OSA PE HTN HLD HFpEF ESRD TTS at [facility] but was hospitalized at [facility] 10/20 - 11/23/2020 with hypoxia and found to have an acute left upper lobe PE treated with heparin and then transitioned to Eliquis to Lovenox. At that time patient was to be discharged home with hospice care but seemed to improve at home and family reversed hospice, restarting dialysis. Patient returned late Nov 2022 and was diagnosed with a left lower lobe pulmonary  embolism by CTA and this time d/c w/ Eliquis given she is ESRD. She is now presenting with increased drainage from her sacral wound. Son  states that she had lack of enteral intake about 2 weeks ago. She was seen by CCM in the ED for admission to the ICU for sepsis, started on broad spectrum antibiotics. In the ED Tmax 100.96F, tachycardic to 130s with BP 47'S systolic, WBC 96.2, Hgb 8.6 (baseline), INR 1.5 (on Eliquis), LA 6.9, BUN 62, Cr 9.7 (on HD), grossly normal LFTs. CXR was unremarkable. Blood and urine cultures were ordered. Empiric cefepime/vanc initiated for presumed sepsis. Of note her last outpatient truncated HD treatments were on Tues and Thur for only 2hr14 and 2hr 65min treatments. She has been losing weight as well and left at 70.7 kg.   ROS Pertinent items are noted in HPI.  Chemistry and CBC: Creat  Date/Time Value Ref Range Status  07/24/2011 09:25 AM 1.36 (H) 0.50 - 1.10 mg/dL Final   Creatinine, Ser  Date/Time Value Ref Range Status  01/05/2021 03:03 PM 9.70 (H) 0.44 - 1.00 mg/dL Final  12/15/2020 08:25 AM 8.72 (H) 0.44 - 1.00 mg/dL Final    Comment:    DIALYSIS  12/14/2020 03:32 AM 16.40 (H) 0.44 - 1.00 mg/dL Final  11/21/2020 08:25 AM 3.39 (H) 0.44 - 1.00 mg/dL Final  11/17/2020 10:31 AM 2.67 (H) 0.44 - 1.00 mg/dL Final  11/14/2020 07:51 AM 2.78 (H) 0.44 - 1.00 mg/dL Final    Comment:    DELTA CHECK NOTED  11/13/2020 10:43 AM  3.79 (H) 0.44 - 1.00 mg/dL Final  11/13/2020 10:43 AM 3.79 (H) 0.44 - 1.00 mg/dL Final  11/10/2020 09:16 AM 3.84 (H) 0.44 - 1.00 mg/dL Final  11/09/2020 05:28 PM 3.03 (H) 0.44 - 1.00 mg/dL Final  07/04/2020 12:08 PM 17.53 (H) 0.44 - 1.00 mg/dL Final  06/20/2020 08:59 PM 13.48 (H) 0.44 - 1.00 mg/dL Final  06/20/2020 04:35 PM 13.38 (H) 0.44 - 1.00 mg/dL Final  06/15/2020 08:37 PM 7.23 (H) 0.44 - 1.00 mg/dL Final  05/11/2020 07:08 PM 4.28 (H) 0.44 - 1.00 mg/dL Final  04/22/2020 05:03 PM 8.32 (H) 0.44 - 1.00 mg/dL Final  04/13/2020 09:46 AM  7.70 (H) 0.44 - 1.00 mg/dL Final  04/10/2020 03:14 PM 7.81 (H) 0.44 - 1.00 mg/dL Final  04/08/2020 01:52 PM 8.39 (H) 0.44 - 1.00 mg/dL Final  04/06/2020 10:37 AM 8.38 (H) 0.44 - 1.00 mg/dL Final  04/05/2020 02:01 AM 5.65 (H) 0.44 - 1.00 mg/dL Final  04/04/2020 08:47 AM 8.31 (H) 0.44 - 1.00 mg/dL Final  04/03/2020 01:29 AM 6.35 (H) 0.44 - 1.00 mg/dL Final  04/02/2020 05:48 AM 4.71 (H) 0.44 - 1.00 mg/dL Final    Comment:    DELTA CHECK NOTED  04/01/2020 08:09 AM 7.07 (H) 0.44 - 1.00 mg/dL Final    Comment:    DELTA CHECK NOTED DIALYSIS   03/31/2020 01:09 AM 4.43 (H) 0.44 - 1.00 mg/dL Final    Comment:    DELTA CHECK NOTED  03/30/2020 05:38 AM 8.35 (H) 0.44 - 1.00 mg/dL Final  03/29/2020 12:23 PM 7.02 (H) 0.44 - 1.00 mg/dL Final  03/22/2020 04:37 PM 8.00 (H) 0.44 - 1.00 mg/dL Final  03/22/2020 04:00 PM 7.85 (H) 0.44 - 1.00 mg/dL Final  03/11/2020 01:46 PM 8.85 (H) 0.44 - 1.00 mg/dL Final  03/10/2020 03:48 AM 6.75 (H) 0.44 - 1.00 mg/dL Final  03/09/2020 01:54 PM 9.27 (H) 0.44 - 1.00 mg/dL Final  03/08/2020 02:41 PM 8.21 (H) 0.44 - 1.00 mg/dL Final  03/02/2020 06:44 AM 3.86 (H) 0.44 - 1.00 mg/dL Final    Comment:    DELTA CHECK NOTED DIALYSIS   03/01/2020 01:01 AM 6.88 (H) 0.44 - 1.00 mg/dL Final  02/29/2020 10:38 AM 6.41 (H) 0.44 - 1.00 mg/dL Final  02/28/2020 04:05 PM 8.92 (H) 0.44 - 1.00 mg/dL Final  02/27/2020 09:23 AM 7.30 (H) 0.44 - 1.00 mg/dL Final  02/26/2020 05:00 AM 5.97 (H) 0.44 - 1.00 mg/dL Final  02/25/2020 02:00 AM 7.06 (H) 0.44 - 1.00 mg/dL Final  02/24/2020 01:50 AM 5.75 (H) 0.44 - 1.00 mg/dL Final    Comment:    DELTA CHECK NOTED  02/23/2020 02:34 AM 8.89 (H) 0.44 - 1.00 mg/dL Final  02/21/2020 01:24 AM 9.72 (H) 0.44 - 1.00 mg/dL Final  02/20/2020 03:15 AM 8.20 (H) 0.44 - 1.00 mg/dL Final  02/19/2020 01:04 AM 6.34 (H) 0.44 - 1.00 mg/dL Final  02/18/2020 03:30 AM 7.83 (H) 0.44 - 1.00 mg/dL Final  02/17/2020 02:19 AM 6.24 (H) 0.44 - 1.00 mg/dL Final  02/16/2020  06:15 PM 5.83 (H) 0.44 - 1.00 mg/dL Final  02/15/2020 08:43 PM 6.05 (H) 0.44 - 1.00 mg/dL Final  01/20/2020 12:43 PM 3.99 (H) 0.44 - 1.00 mg/dL Final  01/06/2020 12:12 PM 4.18 (H) 0.44 - 1.00 mg/dL Final   Recent Labs  Lab 01/11/2021 1503  NA 141  K 4.6  CL 101  CO2 17*  GLUCOSE 333*  BUN 62*  CREATININE 9.70*  CALCIUM 9.1   Recent Labs  Lab 01/03/2021 1503  WBC  50.6*  NEUTROABS 39.5*  HGB 8.6*  HCT 26.6*  MCV 79.2*  PLT 170   Liver Function Tests: Recent Labs  Lab 01/08/2021 1503  AST 19  ALT 14  ALKPHOS 177*  BILITOT 1.1  PROT 6.8  ALBUMIN 1.7*   No results for input(s): LIPASE, AMYLASE in the last 168 hours. No results for input(s): AMMONIA in the last 168 hours. Cardiac Enzymes: No results for input(s): CKTOTAL, CKMB, CKMBINDEX, TROPONINI in the last 168 hours. Iron Studies: No results for input(s): IRON, TIBC, TRANSFERRIN, FERRITIN in the last 72 hours. PT/INR: @LABRCNTIP (inr:5)  Xrays/Other Studies: ) Results for orders placed or performed during the hospital encounter of 12/29/2020 (from the past 48 hour(s))  Resp Panel by RT-PCR (Flu A&B, Covid) Nasopharyngeal Swab     Status: None   Collection Time: 01/05/2021  2:48 PM   Specimen: Nasopharyngeal Swab; Nasopharyngeal(NP) swabs in vial transport medium  Result Value Ref Range   SARS Coronavirus 2 by RT PCR NEGATIVE NEGATIVE    Comment: (NOTE) SARS-CoV-2 target nucleic acids are NOT DETECTED.  The SARS-CoV-2 RNA is generally detectable in upper respiratory specimens during the acute phase of infection. The lowest concentration of SARS-CoV-2 viral copies this assay can detect is 138 copies/mL. A negative result does not preclude SARS-Cov-2 infection and should not be used as the sole basis for treatment or other patient management decisions. A negative result may occur with  improper specimen collection/handling, submission of specimen other than nasopharyngeal swab, presence of viral mutation(s) within  the areas targeted by this assay, and inadequate number of viral copies(<138 copies/mL). A negative result must be combined with clinical observations, patient history, and epidemiological information. The expected result is Negative.  Fact Sheet for Patients:  EntrepreneurPulse.com.au  Fact Sheet for Healthcare Providers:  IncredibleEmployment.be  This test is no t yet approved or cleared by the [place] FDA and  has been authorized for detection and/or diagnosis of SARS-CoV-2 by FDA under an Emergency Use Authorization (EUA). This EUA will remain  in effect (meaning this test can be used) for the duration of the COVID-19 declaration under Section 564(b)(1) of the Act, 21 U.S.C.section 360bbb-3(b)(1), unless the authorization is terminated  or revoked sooner.       Influenza A by PCR NEGATIVE NEGATIVE   Influenza B by PCR NEGATIVE NEGATIVE    Comment: (NOTE) The Xpert Xpress SARS-CoV-2/FLU/RSV plus assay is intended as an aid in the diagnosis of influenza from Nasopharyngeal swab specimens and should not be used as a sole basis for treatment. Nasal washings and aspirates are unacceptable for Xpert Xpress SARS-CoV-2/FLU/RSV testing.  Fact Sheet for Patients: EntrepreneurPulse.com.au  Fact Sheet for Healthcare Providers: IncredibleEmployment.be  This test is not yet approved or cleared by the [place] FDA and has been authorized for detection and/or diagnosis of SARS-CoV-2 by FDA under an Emergency Use Authorization (EUA). This EUA will remain in effect (meaning this test can be used) for the duration of the COVID-19 declaration under Section 564(b)(1) of the Act, 21 U.S.C. section 360bbb-3(b)(1), unless the authorization is terminated or revoked.  Performed at [facility], [place] [address], [place], [place] [address]   Lactic acid, plasma     Status: Abnormal   Collection Time:  01/12/2021  3:03 PM  Result Value Ref Range   Lactic Acid, Venous 6.9 (HH) 0.5 - 1.9 mmol/L    Comment: CRITICAL RESULT CALLED TO, READ BACK BY AND VERIFIED WITH: Leafy Half 1701 01/06/2021 WBOND Performed at [facility]  [facility], [place] [address], [place], [place] [address]   Comprehensive metabolic panel     Status: Abnormal   Collection Time: 01/08/2021  3:03 PM  Result Value Ref Range   Sodium 141 135 - 145 mmol/L   Potassium 4.6 3.5 - 5.1 mmol/L   Chloride 101 98 - 111 mmol/L   CO2 17 (L) 22 - 32 mmol/L   Glucose, Bld 333 (H) 70 - 99 mg/dL    Comment: Glucose reference range applies only to samples taken after fasting for at least 8 hours.   BUN 62 (H) 8 - 23 mg/dL   Creatinine, Ser 9.70 (H) 0.44 - 1.00 mg/dL   Calcium 9.1 8.9 - 10.3 mg/dL   Total Protein 6.8 6.5 - 8.1 g/dL   Albumin 1.7 (L) 3.5 - 5.0 g/dL   AST 19 15 - 41 U/L   ALT 14 0 - 44 U/L   Alkaline Phosphatase 177 (H) 38 - 126 U/L   Total Bilirubin 1.1 0.3 - 1.2 mg/dL   GFR, Estimated 4 (L) >60 mL/min    Comment: (NOTE) Calculated using the CKD-EPI Creatinine Equation (2021)    Anion gap 23 (H) 5 - 15    Comment: REPEATED TO VERIFY Performed at [facility], [place] [address] [place], [place] [address]   CBC WITH DIFFERENTIAL     Status: Abnormal   Collection Time: 12/25/2020  3:03 PM  Result Value Ref Range   WBC 50.6 (HH) 4.0 - 10.5 K/uL    Comment: This critical result has verified and been called to K RAND RN by Lindi Adie on 12 06 2022 at 1641, and has been read back.    RBC 3.36 (L) 3.87 - 5.11 MIL/uL   Hemoglobin 8.6 (L) 12.0 - 15.0 g/dL   HCT 26.6 (L) 36.0 - 46.0 %   MCV 79.2 (L) 80.0 - 100.0 fL   MCH 25.6 (L) 26.0 - 34.0 pg   MCHC 32.3 30.0 - 36.0 g/dL   RDW 21.1 (H) 11.5 - 15.5 %   Platelets 170 150 - 400 K/uL   nRBC 0.7 (H) 0.0 - 0.2 %   Neutrophils Relative % 78 %   Neutro Abs 39.5 (H) 1.7 - 7.7 K/uL   Lymphocytes Relative 2 %   Lymphs Abs 1.1 0.7 - 4.0 K/uL   Monocytes Relative 4 %    Monocytes Absolute 1.9 (H) 0.1 - 1.0 K/uL   Eosinophils Relative 0 %   Eosinophils Absolute 0.0 0.0 - 0.5 K/uL   Basophils Relative 1 %   Basophils Absolute 0.3 (H) 0.0 - 0.1 K/uL   WBC Morphology MILD LEFT SHIFT (1-5% METAS, OCC MYELO, OCC BANDS)    RBC Morphology MORPHOLOGY UNREMARKABLE    Smear Review MORPHOLOGY UNREMARKABLE    Immature Granulocytes 15 %   Abs Immature Granulocytes 7.78 (H) 0.00 - 0.07 K/uL    Comment: Performed at [facility], [address] [place], [place] [address]  Protime-INR     Status: Abnormal   Collection Time: 01/01/2021  3:03 PM  Result Value Ref Range   Prothrombin Time 18.1 (H) 11.4 - 15.2 seconds   INR 1.5 (H) 0.8 - 1.2    Comment: (NOTE) INR goal varies based on device and disease states. Performed at [facility], [place] [address], [place], [place] [address]   APTT     Status: None   Collection Time: 12/25/2020  3:03 PM  Result Value Ref Range  aPTT 35 24 - 36 seconds    Comment: Performed at [facility], [place] [address], [place], [place] [address]  Lactic acid, plasma     Status: Abnormal   Collection Time: 12/23/2020  5:16 PM  Result Value Ref Range   Lactic Acid, Venous 6.4 (HH) 0.5 - 1.9 mmol/L    Comment: CRITICAL VALUE NOTED.  VALUE IS CONSISTENT WITH PREVIOUSLY REPORTED AND CALLED VALUE. Performed at [facility], [place] [address], [place], [place] [address]    *Note: Due to a large number of results and/or encounters for the requested time period, some results have not been displayed. A complete set of results can be found in Results Review.   DG Chest Port 1 View  Result Date: 12/27/2020 CLINICAL DATA:  Possible sepsis. EXAM: PORTABLE CHEST 1 VIEW COMPARISON:  CT a chest in chest x-ray dated December 13, 2020. FINDINGS: Unchanged tunneled right internal jugular dialysis catheter with tip in the inferior right atrium. The heart size and mediastinal contours are within normal limits. Normal pulmonary vascularity. No  focal consolidation, pleural effusion, or pneumothorax. Unchanged elevation of the right hemidiaphragm. No acute osseous abnormality. IMPRESSION: 1. No active disease. Electronically Signed   By: Titus Dubin M.D.   On: 01/12/2021 15:11    PMH:   Past Medical History:  Diagnosis Date   Adenomatous colon polyp    Allergy    Anemia    Asthma        CAD (coronary artery disease)    Mild very minimal coronary disease with 20% obtuse marginal stenosis   Carpal tunnel syndrome on left    Cataract    CHF (congestive heart failure) (HCC)    Chronic kidney disease (CKD), stage III (moderate) (HCC)    now stage 4   COPD (chronic obstructive pulmonary disease) ([place])    Dementia with behavioral disturbance 03/09/2020   Depression    Diabetes mellitus 1997   Type II    Diverticulosis    Dyspnea    Elevated diaphragm November 2011   Right side   Esophageal dysmotility    Esophageal stricture    ESRD on hemodialysis ([place]) 03/09/2020   Gastritis    Gastroparesis 08/21/2007   GERD (gastroesophageal reflux disease)    Hearing loss of both ears    Hernia, hiatal    Hyperlipidemia    Hypertension    Morbid obesity ([place])    OSTEOARTHRITIS 08/09/2006   Osteoporosis    Oxygen deficiency    prn    PERIPHERAL NEUROPATHY, FEET 09/23/2007   RENAL INSUFFICIENCY 02/16/2009   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   Seizures ([place])    pt thinks it has been several monthes since she had a seisure   Shingles    Sickle cell trait ([place])    Sleep apnea    uses cpap   Stroke ([place])     PSH:   Past Surgical History:  Procedure Laterality Date   ABDOMINAL HYSTERECTOMY     ARTERY BIOPSY  01/07/2011   Procedure: MINOR BIOPSY TEMPORAL ARTERY;  Surgeon: Haywood Lasso, MD;  Location: [facility];  Service: General;  Laterality: Left;  left temporal artery biopsy   AV FISTULA PLACEMENT Right 11/16/2018   Procedure: CREATION RIGHT BRACHIOCEPHALIC FISTULA  ARTERIOVENOUS FISTULA;   Surgeon: Angelia Mould, MD;  Location[facility] OR;  Service: Vascular;  Laterality: Right;   bil foot surgery     BREAST LUMPECTOMY     benign  BREAST LUMPECTOMY     both breast lumps removed    CATARACT EXTRACTION Left 06/2016   Dr. Read Drivers   COLONOSCOPY     COLONOSCOPY WITH PROPOFOL N/A 07/05/2015   Procedure: COLONOSCOPY WITH PROPOFOL;  Surgeon: Manus Gunning, MD;  Location: [facility];  Service: Gastroenterology;  Laterality: N/A;   COLONOSCOPY WITH PROPOFOL N/A 08/04/2018   Procedure: COLONOSCOPY WITH PROPOFOL;  Surgeon: Yetta Flock, MD;  Location: [facility];  Service: Gastroenterology;  Laterality: N/A;   ERD  08/08/2000   ESOPHAGEAL MANOMETRY N/A 03/13/2015   Procedure: ESOPHAGEAL MANOMETRY (EM);  Surgeon: Manus Gunning, MD;  Location: [facility];  Service: Gastroenterology;  Laterality: N/A;   ESOPHAGOGASTRODUODENOSCOPY  06/25/2006   ESOPHAGOGASTRODUODENOSCOPY (EGD) WITH PROPOFOL N/A 07/05/2015   Procedure: ESOPHAGOGASTRODUODENOSCOPY (EGD) WITH PROPOFOL;  Surgeon: Manus Gunning, MD;  Location: [facility];  Service: Gastroenterology;  Laterality: N/A;   FISTULA SUPERFICIALIZATION Right 11/16/2018   Procedure: Fistula Superficialization;  Surgeon: Angelia Mould, MD;  Location: [facility] OR;  Service: Vascular;  Laterality: Right;   HERNIA REPAIR     IR FLUORO GUIDE CV LINE LEFT  11/10/2020   IR PERC TUN PERIT CATH WO PORT S&I /IMAG  02/17/2020   IR US GUIDE VASC ACCESS LEFT  11/10/2020   IR US GUIDE VASC ACCESS RIGHT  02/18/2020   LEFT AND RIGHT HEART CATHETERIZATION WITH CORONARY ANGIOGRAM N/A 03/03/2013   Procedure: LEFT AND RIGHT HEART CATHETERIZATION WITH CORONARY ANGIOGRAM;  Surgeon: Minus Breeding, MD;  Location: [facility] CATH LAB;  Service: Cardiovascular;  Laterality: N/A;   REPLACEMENT TOTAL KNEE Left 1998   UPPER GASTROINTESTINAL ENDOSCOPY      Allergies:  Allergies  Allergen Reactions   Morphine And Related Other (See  Comments)    Family request not to be given, reports pt does not wake up when given    Gabapentin Other (See Comments)    Pt is hard to wake up when this is given per sister    Oxycodone Other (See Comments)    Pt is hard to wake up when this is given per sister   Promethazine Hcl Other (See Comments)    lethargy    Medications:   Prior to Admission medications   Medication Sig Start Date End Date Taking? Authorizing Provider  acetaminophen (TYLENOL) 325 MG tablet Take 2 tablets (650 mg total) by mouth every 6 (six) hours as needed for mild pain (or Fever >/= 101). Patient not taking: Reported on 12/14/2020 11/23/20   Nicole Kindred A, DO  apixaban (ELIQUIS) 5 MG TABS tablet Take 1 tablet (5 mg total) by mouth 2 (two) times daily. Start only after you have completed the starter pack. 01/19/21   Bonnielee Haff, MD  APIXABAN [place]ne [place]) VTE STARTER PACK (10MG  AND 5MG ) Take as directed on package: start with two-5mg  tablets twice daily for 7 days. On day 8, switch to one-5mg  tablet twice daily. 12/18/20   Bonnielee Haff, MD  haloperidol (HALDOL) 0.5 MG tablet Take 1 tablet (0.5 mg total) by mouth every 6 (six) hours as needed for agitation. Patient not taking: Reported on 12/14/2020 11/23/20   Ezekiel Slocumb, DO  Nutritional Supplements (NEPRO) LIQD Take 237 mLs by mouth 3 (three) times daily.    [provider]  ondansetron (ZOFRAN-ODT) 4 MG disintegrating tablet Take 1 tablet (4 mg total) by mouth every 6 (six) hours as needed for nausea. Patient not taking: Reported on 12/14/2020 11/23/20   Nicole Kindred A, DO  oxyCODONE (ROXICODONE  INTENSOL) 20 MG/ML concentrated solution Place 0.3 mLs (6 mg total) under the tongue every 2 (two) hours as needed for moderate pain or severe pain (or dyspnea). Patient not taking: Reported on 12/14/2020 11/23/20   Nicole Kindred A, DO  OXYGEN Inhale 2 L into the lungs as needed. CPAP with oxygen at bedtime Patient not taking: Reported on 12/14/2020     [provider]  polyvinyl alcohol (LIQUIFILM TEARS) 1.4 % ophthalmic solution Place 1 drop into both eyes 4 (four) times daily as needed for dry eyes. Patient not taking: Reported on 12/14/2020 11/23/20   Ezekiel Slocumb, DO  senna (SENOKOT) 8.6 MG tablet Take 2 tablets (17.2 mg total) by mouth 2 (two) times daily. May crush, mix with water and give sublingually if needed. Patient not taking: Reported on 12/14/2020 11/23/20   Nicole Kindred A, DO    Discontinued Meds:  There are no discontinued medications.  Social History:  reports that she quit smoking about 40 years ago. She has a 5.00 pack-year smoking history. She has never used smokeless tobacco. She reports that she does not drink alcohol and does not use drugs.  Family History:   Family History  Problem Relation Age of Onset   Liver cancer Mother        Liver Cancer   Diabetes Mother    Kidney disease Mother    Heart disease Mother        age 58's   Heart disease Father 55       MI   Heart attack Father        died of MI when pt was 5   Sickle cell anemia Father    Colon cancer Brother    Cancer Brother        Colon Cancer   Diabetes Sister    Kidney disease Sister    Heart disease Sister        age 31's   Allergies Sister    Diabetes Sister    Kidney disease Sister    Heart disease Sister        age 46's   Esophageal cancer Neg Hx    Rectal cancer Neg Hx    Stomach cancer Neg Hx    Amblyopia Neg Hx    Blindness Neg Hx    Glaucoma Neg Hx    Macular degeneration Neg Hx    Retinal detachment Neg Hx    Cataracts Neg Hx    Strabismus Neg Hx    Retinitis pigmentosa Neg Hx     Blood pressure (!) 85/48, pulse (!) 110, temperature 98 F (36.7 C), temperature source Temporal, resp. rate (!) 21, height 5\' 4"  (1.626 m), weight 73 kg, SpO2 100 %. General:chronically ill appearing confused, elderly female in NAD, with mittens on hands Heart:tachycardic, no MRG Lungs:CTAB, nml WOB on RA Abdomen:soft,  NTND Extremities:trace LE edema Dialysis Access: TDC RIJ       Dwana Melena, MD 01/09/2021, 7:16 PM

## 2020-12-27 ENCOUNTER — Inpatient Hospital Stay (HOSPITAL_COMMUNITY): Payer: Medicare HMO

## 2020-12-27 ENCOUNTER — Encounter (HOSPITAL_BASED_OUTPATIENT_CLINIC_OR_DEPARTMENT_OTHER): Payer: Medicare HMO | Admitting: Physician Assistant

## 2020-12-27 DIAGNOSIS — I480 Paroxysmal atrial fibrillation: Secondary | ICD-10-CM

## 2020-12-27 DIAGNOSIS — N186 End stage renal disease: Secondary | ICD-10-CM

## 2020-12-27 DIAGNOSIS — R6520 Severe sepsis without septic shock: Secondary | ICD-10-CM | POA: Diagnosis not present

## 2020-12-27 DIAGNOSIS — Z7189 Other specified counseling: Secondary | ICD-10-CM

## 2020-12-27 DIAGNOSIS — R627 Adult failure to thrive: Secondary | ICD-10-CM

## 2020-12-27 DIAGNOSIS — A419 Sepsis, unspecified organism: Secondary | ICD-10-CM | POA: Diagnosis not present

## 2020-12-27 LAB — CBC
HCT: 16.6 % — ABNORMAL LOW (ref 36.0–46.0)
HCT: 18.2 % — ABNORMAL LOW (ref 36.0–46.0)
HCT: 27.6 % — ABNORMAL LOW (ref 36.0–46.0)
Hemoglobin: 5.1 g/dL — CL (ref 12.0–15.0)
Hemoglobin: 6.3 g/dL — CL (ref 12.0–15.0)
Hemoglobin: 9.6 g/dL — ABNORMAL LOW (ref 12.0–15.0)
MCH: 25.9 pg — ABNORMAL LOW (ref 26.0–34.0)
MCH: 26.5 pg (ref 26.0–34.0)
MCH: 27.7 pg (ref 26.0–34.0)
MCHC: 30.7 g/dL (ref 30.0–36.0)
MCHC: 34.6 g/dL (ref 30.0–36.0)
MCHC: 34.8 g/dL (ref 30.0–36.0)
MCV: 84.3 fL (ref 80.0–100.0)
MCV: 76.5 fL — ABNORMAL LOW (ref 80.0–100.0)
MCV: 79.5 fL — ABNORMAL LOW (ref 80.0–100.0)
Platelets: UNDETERMINED 10*3/uL (ref 150–400)
Platelets: 104 10*3/uL — ABNORMAL LOW (ref 150–400)
Platelets: 86 10*3/uL — ABNORMAL LOW (ref 150–400)
RBC: 1.97 MIL/uL — ABNORMAL LOW (ref 3.87–5.11)
RBC: 2.38 MIL/uL — ABNORMAL LOW (ref 3.87–5.11)
RBC: 3.47 MIL/uL — ABNORMAL LOW (ref 3.87–5.11)
RDW: 21.9 % — ABNORMAL HIGH (ref 11.5–15.5)
RDW: 21.6 % — ABNORMAL HIGH (ref 11.5–15.5)
RDW: 19.4 % — ABNORMAL HIGH (ref 11.5–15.5)
WBC: 31 10*3/uL — ABNORMAL HIGH (ref 4.0–10.5)
WBC: 31.4 10*3/uL — ABNORMAL HIGH (ref 4.0–10.5)
WBC: 24.5 10*3/uL — ABNORMAL HIGH (ref 4.0–10.5)
nRBC: 0.8 % — ABNORMAL HIGH (ref 0.0–0.2)
nRBC: 0.9 % — ABNORMAL HIGH (ref 0.0–0.2)
nRBC: 1.6 % — ABNORMAL HIGH (ref 0.0–0.2)

## 2020-12-27 LAB — BASIC METABOLIC PANEL
Anion gap: 19 — ABNORMAL HIGH (ref 5–15)
BUN: 62 mg/dL — ABNORMAL HIGH (ref 8–23)
CO2: 15 mmol/L — ABNORMAL LOW (ref 22–32)
Calcium: 8.4 mg/dL — ABNORMAL LOW (ref 8.9–10.3)
Chloride: 109 mmol/L (ref 98–111)
Creatinine, Ser: 8.87 mg/dL — ABNORMAL HIGH (ref 0.44–1.00)
GFR, Estimated: 4 mL/min — ABNORMAL LOW (ref >60)
Glucose, Bld: 191 mg/dL — ABNORMAL HIGH (ref 70–99)
Potassium: 4.1 mmol/L (ref 3.5–5.1)
Sodium: 143 mmol/L (ref 135–145)

## 2020-12-27 LAB — BLOOD CULTURE ID PANEL (REFLEXED) - BCID2

## 2020-12-27 LAB — HEMOGLOBIN AND HEMATOCRIT, BLOOD
HCT: 16.3 % — ABNORMAL LOW (ref 36.0–46.0)
Hemoglobin: 5.5 g/dL — CL (ref 12.0–15.0)

## 2020-12-27 LAB — PREPARE RBC (CROSSMATCH)

## 2020-12-27 LAB — GLUCOSE, CAPILLARY
Glucose-Capillary: 228 mg/dL — ABNORMAL HIGH (ref 70–99)
Glucose-Capillary: 181 mg/dL — ABNORMAL HIGH (ref 70–99)

## 2020-12-27 LAB — LACTIC ACID, PLASMA: Lactic Acid, Venous: 5.8 mmol/L (ref 0.5–1.9)

## 2020-12-27 LAB — CBG MONITORING, ED
Glucose-Capillary: 212 mg/dL — ABNORMAL HIGH (ref 70–99)
Glucose-Capillary: 237 mg/dL — ABNORMAL HIGH (ref 70–99)

## 2020-12-27 LAB — POC OCCULT BLOOD, ED: Fecal Occult Bld: NEGATIVE

## 2020-12-27 MED ORDER — SODIUM BICARBONATE 8.4 % IV SOLN
100.0000 meq | Freq: Once | INTRAVENOUS | Status: AC
Start: 2020-12-27 — End: 2020-12-27
  Administered 2020-12-27: 100 meq via INTRAVENOUS
  Filled 2020-12-27: qty 50

## 2020-12-27 MED ORDER — AMIODARONE LOAD VIA INFUSION
150.0000 mg | Freq: Once | INTRAVENOUS | Status: AC
  Administered 2020-12-27: 150 mg via INTRAVENOUS
  Filled 2020-12-27: qty 83.34

## 2020-12-27 MED ORDER — AMIODARONE HCL IN DEXTROSE 360-4.14 MG/200ML-% IV SOLN
60.0000 mg/h | INTRAVENOUS | Status: AC
  Administered 2020-12-27: 60 mg/h via INTRAVENOUS
  Filled 2020-12-27: qty 200

## 2020-12-27 MED ORDER — AMIODARONE HCL IN DEXTROSE 360-4.14 MG/200ML-% IV SOLN
30.0000 mg/h | INTRAVENOUS | Status: DC
  Administered 2020-12-27 – 2020-12-28 (×2): 30 mg/h via INTRAVENOUS
  Filled 2020-12-27 (×2): qty 200

## 2020-12-27 MED ORDER — COLLAGENASE 250 UNIT/GM EX OINT
TOPICAL_OINTMENT | Freq: Every day | CUTANEOUS | Status: DC
  Filled 2020-12-27: qty 30

## 2020-12-27 MED ORDER — SODIUM CHLORIDE 0.9% IV SOLUTION: Freq: Once | INTRAVENOUS | Status: DC

## 2020-12-27 MED ORDER — DIPHENHYDRAMINE HCL 50 MG/ML IJ SOLN
25.0000 mg | Freq: Once | INTRAMUSCULAR | Status: AC
  Administered 2020-12-27: 25 mg via INTRAVENOUS
  Filled 2020-12-27: qty 1

## 2020-12-27 MED ORDER — SODIUM CHLORIDE 0.9 % IV SOLN
2.0000 g | INTRAVENOUS | Status: DC
  Administered 2020-12-27: 2 g via INTRAVENOUS
  Filled 2020-12-27: qty 20

## 2020-12-27 MED ORDER — SODIUM BICARBONATE 8.4 % IV SOLN
50.0000 meq | Freq: Once | INTRAVENOUS | Status: AC
  Administered 2020-12-27: 50 meq via INTRAVENOUS
  Filled 2020-12-27: qty 50

## 2020-12-27 MED ORDER — ACETAMINOPHEN 325 MG PO TABS: 650.0000 mg | ORAL_TABLET | Freq: Once | ORAL | Status: DC

## 2020-12-27 MED ORDER — FUROSEMIDE 10 MG/ML IJ SOLN
20.0000 mg | Freq: Once | INTRAMUSCULAR | Status: AC
  Administered 2020-12-27: 20 mg via INTRAVENOUS
  Filled 2020-12-27: qty 2

## 2020-12-27 NOTE — Progress Notes (Signed)
PROGRESS NOTE    Joann Gutierrez  XBM:[id number] DOB: [birth year] DOA: 01/19/2021 PCP: Andree Moro, DO    Chief Complaint  Patient presents with   Wound Infection    Brief Narrative:  Joann Gutierrez is a 75 y.o. female with medical history significant of ESRD on HD TTS, chronic sacral pressure ulcer, PE on Eliquis, COPD, OSA, chronic ambulation dysfunction wheelchair-bound, was discharged home on hospice recently , appear family has reversed the decision , she is sent back to the hospital admitted for worsening of sacral pressure ulcer and fever.    Subjective:  Demented , not communicating  Assessment & Plan:   Principal Problem:   Sepsis ([place]) Active Problems:   Severe sepsis ([place])   Sepsis present on admission from stage IV sacral decubitus ulcer/bacteremia -Was on broad-spectrum antibiotics, antibiotic adjusted according to [facility] ID  A. fib RVR New diagnosis Start amiodarone drip due to hypotension  Acute on chronic anemia -Received 4 units PRBC transfusion, hemoglobin has improved posttransfusion Does not appear to have external bleed -Anticoagulation held  Recent diagnosis of PE Anticoagulation held due to acute on chronic anemia  End-stage renal disease Not a candidate for HD due to hypotension Nephrology consulted Critical care consulted Per outpatient dialysis unit, patient now is a ward of state DSS states that at this moment [name] is the person that needs to be contacted for assistance with guardianship. Her numbers are [phone] and cell [phone]  Patient is made DNR, no escalation of care, per critical care /nephrology Appreciate input  FTT: Per family patient has mostly bed to wheelchair bound since January 2022, has significant weight loss of 40 pounds The patient's BMI is: Body mass index is 27.62 kg/m.. I have talked to the sister over the phone (who reportedly is healthcare power of attorney initially) , I explained to  sister this morning that CPR or intubation would not provide benefit to this patient with advanced dementia, progressive weight loss,  bed to wheelchair bound since January 2022 , she expressed understanding Palliative consulted for goals of care discussion      .     Skin Assessment:  I have examined the patient's skin and I agree with the wound assessment as performed by the wound care RN as outlined below:  Pressure Injury 11/10/20 Buttocks Left Stage 2 -  Partial thickness loss of dermis presenting as a shallow open injury with a red, pink wound bed without slough. (Active)  11/10/20 1921  Location: Buttocks  Location Orientation: Left  Staging: Stage 2 -  Partial thickness loss of dermis presenting as a shallow open injury with a red, pink wound bed without slough.  Wound Description (Comments):   Present on Admission: Yes     Pressure Injury 11/14/20 Buttocks Right Stage 2 -  Partial thickness loss of dermis presenting as a shallow open injury with a red, pink wound bed without slough. (Active)  11/14/20 1400  Location: Buttocks  Location Orientation: Right  Staging: Stage 2 -  Partial thickness loss of dermis presenting as a shallow open injury with a red, pink wound bed without slough.  Wound Description (Comments):   Present on Admission: No    Unresulted Labs (From admission, onward)     Start     Ordered   12/27/20 0845  MRSA Next Gen by PCR, Nasal  Once,   R        12/27/20 0844   12/27/20 0744  CBC  Daily,   R  12/27/20 0743   01/02/2021 2015  Lactic acid, plasma  STAT Now then every 3 hours,   R (with STAT occurrences)      01/05/2021 2014   01/01/2021 1447  Blood Culture (routine x 2)  (Undifferentiated presentation (screening labs and basic nursing orders))  BLOOD CULTURE X 2,   STAT      01/03/2021 1446   01/03/2021 1447  Urinalysis, Routine w reflex microscopic  (Undifferentiated presentation (screening labs and basic nursing orders))  ONCE - STAT,   STAT         01/10/2021 1446   01/01/2021 1447  Urine Culture  (Undifferentiated presentation (screening labs and basic nursing orders))  ONCE - STAT,   STAT       Question:  Indication  Answer:  Sepsis   01/18/2021 1446   Unscheduled  Occult blood card to lab, stool  As needed,   R      12/27/20 0416              DVT prophylaxis: was on heparin drip, held due to acute anemia   Code Status: DNR , no escalation of care per critical care /nephrology Family Communication: sister over the phone Disposition:   Status is: Inpatient  Dispo: The patient is from: home              Anticipated d/c is to: TBD              Anticipated d/c date is: TBD                Consultants:  Critical care Nephrology  Procedures:  none  Antimicrobials:    Anti-infectives (From admission, onward)    Start     Dose/Rate Route Frequency Ordered Stop   12/27/20 1800  ceFEPIme (MAXIPIME) 1 g in sodium chloride 0.9 % 100 mL IVPB  Status:  Discontinued        1 g 200 mL/hr over 30 Minutes Intravenous Every 24 hours 01/18/2021 2208 12/27/20 0846   12/27/20 1800  cefTRIAXone (ROCEPHIN) 2 g in sodium chloride 0.9 % 100 mL IVPB        2 g 200 mL/hr over 30 Minutes Intravenous Every 24 hours 12/27/20 0846     12/28/2020 2000  metroNIDAZOLE (FLAGYL) IVPB 500 mg  Status:  Discontinued        500 mg 100 mL/hr over 60 Minutes Intravenous Every 12 hours 01/12/2021 1904 12/27/20 0846   01/09/2021 1530  vancomycin (VANCOREADY) IVPB 1500 mg/300 mL        1,500 mg 150 mL/hr over 120 Minutes Intravenous  Once 01/19/2021 1456 01/04/2021 1804   01/11/2021 1515  ceFEPIme (MAXIPIME) 2 g in sodium chloride 0.9 % 100 mL IVPB        2 g 200 mL/hr over 30 Minutes Intravenous  Once 01/20/2021 1456 12/30/2020 1548          Objective: Vitals:   12/27/20 1020 12/27/20 1029 12/27/20 1045 12/27/20 1100  BP: (!) 88/48 (!) 85/42 (!) 103/49 (!) 114/32  Pulse:  (!) 154 (!) 145 (!) 149  Resp: (!) 32 (!) 30 (!) 21 (!) 28  Temp:  (!) 97.1 F (36.2 C)  97.6 F (36.4 C)   TempSrc:   Temporal   SpO2:    100%  Weight:      Height:        Intake/Output Summary (Last 24 hours) at 12/27/2020 1134 Last data filed at 12/27/2020 0630 Gross per 24 hour  Intake 1757.14 ml  Output --  Net 1757.14 ml   Filed Weights   01/19/2021 1515  Weight: 73 kg    Examination:  General exam: Does not open eyes, does not follow command ,moaning at times Respiratory system:  diminished at bases, no wheezing. Respiratory effort normal. Cardiovascular system:  IRRR.  Gastrointestinal system: Abdomen is nondistended, soft and nontender.  Normal bowel sounds heard. Central nervous system: Advanced dementia. Extremities:  no edema Skin: No rashes, lesions or ulcers Psychiatry: Advanced dementia.     Data Reviewed: I have personally reviewed following labs and imaging studies  CBC: Recent Labs  Lab 12/24/2020 1503 12/27/20 0349 12/27/20 0433 12/27/20 0821  WBC 50.6* 31.0*  --  31.4*  NEUTROABS 39.5*  --   --   --   HGB 8.6* 5.1* 5.5* 6.3*  HCT 26.6* 16.6* 16.3* 18.2*  MCV 79.2* 84.3  --  76.5*  PLT 170 PLATELET CLUMPS NOTED ON SMEAR, UNABLE TO ESTIMATE  --  104*    Basic Metabolic Panel: Recent Labs  Lab 01/03/2021 1503 12/27/20 0300  NA 141 143  K 4.6 4.1  CL 101 109  CO2 17* 15*  GLUCOSE 333* 191*  BUN 62* 62*  CREATININE 9.70* 8.87*  CALCIUM 9.1 8.4*    GFR: Estimated Creatinine Clearance: 5.4 mL/min (A) (by C-G formula based on SCr of 8.87 mg/dL (H)).  Liver Function Tests: Recent Labs  Lab 01/14/2021 1503  AST 19  ALT 14  ALKPHOS 177*  BILITOT 1.1  PROT 6.8  ALBUMIN 1.7*    CBG: Recent Labs  Lab 01/11/2021 2303 12/27/20 0746  GLUCAP 184* 212*     Recent Results (from the past 240 hour(s))  Resp Panel by RT-PCR (Flu A&B, Covid) Nasopharyngeal Swab     Status: None   Collection Time: 01/08/2021  2:48 PM   Specimen: Nasopharyngeal Swab; Nasopharyngeal(NP) swabs in vial transport medium  Result Value Ref Range Status    SARS Coronavirus 2 by RT PCR NEGATIVE NEGATIVE Final    Comment: (NOTE) SARS-CoV-2 target nucleic acids are NOT DETECTED.  The SARS-CoV-2 RNA is generally detectable in upper respiratory specimens during the acute phase of infection. The lowest concentration of SARS-CoV-2 viral copies this assay can detect is 138 copies/mL. A negative result does not preclude SARS-Cov-2 infection and should not be used as the sole basis for treatment or other patient management decisions. A negative result may occur with  improper specimen collection/handling, submission of specimen other than nasopharyngeal swab, presence of viral mutation(s) within the areas targeted by this assay, and inadequate number of viral copies(<138 copies/mL). A negative result must be combined with clinical observations, patient history, and epidemiological information. The expected result is Negative.  Fact Sheet for Patients:  EntrepreneurPulse.com.au  Fact Sheet for Healthcare Providers:  IncredibleEmployment.be  This test is no t yet approved or cleared by the [place] FDA and  has been authorized for detection and/or diagnosis of SARS-CoV-2 by FDA under an Emergency Use Authorization (EUA). This EUA will remain  in effect (meaning this test can be used) for the duration of the COVID-19 declaration under Section 564(b)(1) of the Act, 21 U.S.C.section 360bbb-3(b)(1), unless the authorization is terminated  or revoked sooner.       Influenza A by PCR NEGATIVE NEGATIVE Final   Influenza B by PCR NEGATIVE NEGATIVE Final    Comment: (NOTE) The Xpert Xpress SARS-CoV-2/FLU/RSV plus assay is intended as an aid in the diagnosis of influenza from Nasopharyngeal swab specimens  and should not be used as a sole basis for treatment. Nasal washings and aspirates are unacceptable for Xpert Xpress SARS-CoV-2/FLU/RSV testing.  Fact Sheet for  Patients: EntrepreneurPulse.com.au  Fact Sheet for Healthcare Providers: IncredibleEmployment.be  This test is not yet approved or cleared by the [place] FDA and has been authorized for detection and/or diagnosis of SARS-CoV-2 by FDA under an Emergency Use Authorization (EUA). This EUA will remain in effect (meaning this test can be used) for the duration of the COVID-19 declaration under Section 564(b)(1) of the Act, 21 U.S.C. section 360bbb-3(b)(1), unless the authorization is terminated or revoked.  Performed at [facility], [place] [address], [place], [place] [address]   Blood Culture (routine x 2)     Status: None (Preliminary result)   Collection Time: 12/27/2020  3:03 PM   Specimen: BLOOD  Result Value Ref Range Status   Specimen Description BLOOD LEFT ANTECUBITAL  Final   Special Requests   Final    BOTTLES DRAWN AEROBIC AND ANAEROBIC Blood Culture adequate volume   Culture  Setup Time   Final    GRAM NEGATIVE RODS IN BOTH AEROBIC AND ANAEROBIC BOTTLES CRITICAL RESULT CALLED TO, READ BACK BY AND VERIFIED WITH: PHARMD E.MARTIN AT 6644 ON 12/27/2020 BY T.SAAD. Performed at [facility], [place] [address], [place], [place] [address]    Culture GRAM NEGATIVE RODS  Final   Report Status PENDING  Incomplete  Blood Culture ID Panel (Reflexed)     Status: Abnormal   Collection Time: 01/06/2021  3:03 PM  Result Value Ref Range Status   Enterococcus faecalis NOT DETECTED NOT DETECTED Final   Enterococcus Faecium NOT DETECTED NOT DETECTED Final   Listeria monocytogenes NOT DETECTED NOT DETECTED Final   Staphylococcus species NOT DETECTED NOT DETECTED Final   Staphylococcus aureus (BCID) NOT DETECTED NOT DETECTED Final   Staphylococcus epidermidis NOT DETECTED NOT DETECTED Final   Staphylococcus lugdunensis NOT DETECTED NOT DETECTED Final   Streptococcus species NOT DETECTED NOT DETECTED Final   Streptococcus agalactiae NOT DETECTED  NOT DETECTED Final   Streptococcus pneumoniae NOT DETECTED NOT DETECTED Final   Streptococcus pyogenes NOT DETECTED NOT DETECTED Final   A.calcoaceticus-baumannii NOT DETECTED NOT DETECTED Final   Bacteroides fragilis NOT DETECTED NOT DETECTED Final   Enterobacterales DETECTED (A) NOT DETECTED Final    Comment: Enterobacterales represent a large order of gram negative bacteria, not a single organism. CRITICAL RESULT CALLED TO, READ BACK BY AND VERIFIED WITH: PHARMD E.MARTIN AT 2595 ON 12/27/2020 BY T.SAAD.    Enterobacter cloacae complex NOT DETECTED NOT DETECTED Final   Escherichia coli NOT DETECTED NOT DETECTED Final   Klebsiella aerogenes NOT DETECTED NOT DETECTED Final   Klebsiella oxytoca NOT DETECTED NOT DETECTED Final   Klebsiella pneumoniae NOT DETECTED NOT DETECTED Final   Proteus species DETECTED (A) NOT DETECTED Final    Comment: CRITICAL RESULT CALLED TO, READ BACK BY AND VERIFIED WITH: PHARMD E.MARTIN AT 6387 ON 12/27/2020 BY T.SAAD.    Salmonella species NOT DETECTED NOT DETECTED Final   Serratia marcescens NOT DETECTED NOT DETECTED Final   Haemophilus influenzae NOT DETECTED NOT DETECTED Final   Neisseria meningitidis NOT DETECTED NOT DETECTED Final   Pseudomonas aeruginosa NOT DETECTED NOT DETECTED Final   Stenotrophomonas maltophilia NOT DETECTED NOT DETECTED Final   Candida albicans NOT DETECTED NOT DETECTED Final   Candida auris NOT DETECTED NOT DETECTED Final   Candida glabrata NOT DETECTED NOT DETECTED Final   Candida krusei NOT DETECTED  NOT DETECTED Final   Candida parapsilosis NOT DETECTED NOT DETECTED Final   Candida tropicalis NOT DETECTED NOT DETECTED Final   Cryptococcus neoformans/gattii NOT DETECTED NOT DETECTED Final   CTX-M ESBL NOT DETECTED NOT DETECTED Final   Carbapenem resistance IMP NOT DETECTED NOT DETECTED Final   Carbapenem resistance KPC NOT DETECTED NOT DETECTED Final   Carbapenem resistance NDM NOT DETECTED NOT DETECTED Final   Carbapenem  resist OXA 48 LIKE NOT DETECTED NOT DETECTED Final   Carbapenem resistance VIM NOT DETECTED NOT DETECTED Final    Comment: Performed at [facility], [place] [address] [place], [place] [address]         Radiology Studies: DG Chest [place] 1 View  Result Date: 12/27/2020 CLINICAL DATA:  75 year old female with history of wound infection. Sepsis. EXAM: PORTABLE CHEST 1 VIEW COMPARISON:  Chest x-ray 01/06/2021. FINDINGS: Right internal jugular PermCath with tip terminating in the inferior aspect of the right atrium. Chronic elevation of the right hemidiaphragm, similar to the prior study. Lung volumes are low. No consolidative airspace disease. No pleural effusions. No pneumothorax. No pulmonary nodule or mass noted. Pulmonary vasculature and the cardiomediastinal silhouette are within normal limits. Atherosclerotic calcifications in the thoracic aorta. Healing nondisplaced fracture of the lateral aspect of the left ninth rib is noted. IMPRESSION: 1. Low lung volumes without radiographic evidence of acute cardiopulmonary disease. 2. Aortic atherosclerosis. 3. Healing nondisplaced left lateral ninth rib fracture. Electronically Signed   By: Vinnie Langton M.D.   On: 12/27/2020 05:54   DG Chest Port 1 View  Result Date: 01/16/2021 CLINICAL DATA:  Possible sepsis. EXAM: PORTABLE CHEST 1 VIEW COMPARISON:  CT a chest in chest x-ray dated December 13, 2020. FINDINGS: Unchanged tunneled right internal jugular dialysis catheter with tip in the inferior right atrium. The heart size and mediastinal contours are within normal limits. Normal pulmonary vascularity. No focal consolidation, pleural effusion, or pneumothorax. Unchanged elevation of the right hemidiaphragm. No acute osseous abnormality. IMPRESSION: 1. No active disease. Electronically Signed   By: Titus Dubin M.D.   On: 01/14/2021 15:11        Scheduled Meds:  sodium chloride   Intravenous Once   acetaminophen  650 mg Oral Once    collagenase   Topical Daily   insulin aspart  0-6 Units Subcutaneous TID WC   pantoprazole (PROTONIX) IV  40 mg Intravenous Q24H   Continuous Infusions:  albumin human 25 g (12/27/20 0845)   cefTRIAXone (ROCEPHIN)  IV       LOS: 1 day   Time spent: 24mins, case discussed with palliative care critical care, nephrology Greater than 50% of this time was spent in counseling, explanation of diagnosis, planning of further management, and coordination of care.   Voice Recognition Viviann Spare dictation system was used to create this note, attempts have been made to correct errors. Please contact the author with questions and/or clarifications.   Florencia Reasons, MD PhD FACP [facility]  Available via Epic secure chat 7am-7pm for nonurgent issues Please page for urgent issues To page the attending provider between 7A-7P or the covering provider during after hours 7P-7A, please log into the web site [email or web address] and access using universal [place] password for that web site. If you do not have the password, please call the hospital operator.    12/27/2020, 11:34 AM

## 2020-12-27 NOTE — Progress Notes (Signed)
Pt on unit responsive  to pain moaning. Low Blood pressure With Amio drip with blood pressure at 77/39 R Leg. Stopped Amio drip Called RR.

## 2020-12-27 NOTE — Progress Notes (Signed)
DNR/ Medical futility documents have been completed and sent [facility].  Paper copy in paper chart.

## 2020-12-27 NOTE — Consult Note (Signed)
Palliative Medicine Inpatient Consult Note  Consulting Provider: Lequita Halt, MD  Reason for consult:   Joann Gutierrez Palliative Medicine Consult  Reason for Consult? Severe sepsis and pressure ulcers.   HPI:  Per intake H&P --> Joann Gutierrez is a 75 y.o. female with medical history significant of ESRD on HD TTS, chronic sacral pressure ulcer, PE on Eliquis, COPD, OSA, chronic ambulation dysfunction wheelchair-bound, was brought in by family for worsening of sacral pressure ulcer and fever. Found to be in severe sepsis with profound anemia.   Patient recently admitted 11/09/2020-11/23/2020 for hypoxia secondary to acute left upper lobe PE.  She was initially on IV heparin and transition to oral Eliquis however patient began to refuse oral medications.  Per discharge summary, extensive goals of care discussions with palliative care, nephrology, primary team, and family resulted in transition to DNR/DNI status and discharged to home with hospice care. Reversed by family as they noted improvement and wished to continue dialysis.   Readmitted 11/24 due to hypoxia again r/t PE. Met with PMT and improved after a few days of admission. Family adamant at that time to continue with full scope of care.  Has been followed by [facility] as an outpatient.  Clinical Assessment/Goals of Care:  *Please note that this is a verbal dictation therefore any spelling or grammatical errors are due to the "[place] [facility]" system interpretation.  I have reviewed medical records including EPIC notes, labs and imaging, received report from bedside RN, assessed the patient who did arouse to having her name called.    I called Joann Gutierrez (sister) to further discuss diagnosis prognosis, GOC, EOL wishes, disposition and options.  A review of patients prior medication conditions was held. We discussed Joann Gutierrez's prior history of ESRD requiring HD treatments, COVID, CAD, COPD, CVA, &  diastolic heart failure. We reviewed her discharge on hospice and her transitioning the next day back to Full Code and receiving HD tx.   A review of patients multiple hospitalizations in the past two months was held. We discussed that in that time "[name] has had good and bad days." Joann Gutierrez though overall feels her good days compensate for her bad days which is why she remains a fierce advocate for continued care.   I introduced Palliative Medicine as specialized medical care for people living with serious illness. It focuses on providing relief from the symptoms and stress of a serious illness. The goal is to improve quality of life for both the patient and the family.  Joann Gutierrez is from [place], [place]. She is not married but has a long time partner, Joann Gutierrez. She had three children two of whom are deceased. She formerly worked as a [employer]ar[employer] manager. She values her family and is of the [employer].   Prior to hospitalization Joann Gutierrez required full time care. She required the help of her sister, a niece, and son (when able as he too is disabled) to help with bADLs. She has had a poor appetite for about a month now.   Joann Gutierrez expresses over the phone a tremendous amount of frustration with the Joann Gutierrez she shares that Joann Gutierrez had no wound care on her last admissions and blames this readmission on that. I reviewed the wound care notes and states that she had received wound care from what I could see. Joann Gutierrez stated that no one provider her education on what to do after discharge. She also states that no one came to her home to provide ongoing  wound care. Joann Gutierrez said that she is aware of a wound clinic which was not an option that has been offered to here. For these reasons she is fairly adamant about a transfer to Joann Gutierrez. I stated that these transfers can be difficult but she is welcome to call them on her own and I will alert the primary team.   A detailed discussion was had today regarding  advanced directives - patients primary decision makers 1.) Joann Gutierrez (sister) per family Joann Gutierrez is also a Media planner though this is not reflected on the [facility] documentation rather, Joann Gutierrez is listed as first and second healthcare agent.   Concepts specific to code status, artifical feeding and hydration, continued IV antibiotics and rehospitalization was had.  The difference between a aggressive medical intervention path  and a palliative comfort care path for this patient at this time was had. We reviewed that [name] was a DNR at one point which was reversed after she arrived to her home in early November. Joann Gutierrez attests that is because she had improvements at that time. Joann Gutierrez is aware of the trauma and poor health outcomes of cardiopulmonary resuscitation in patients such as Joann Gutierrez though feels this is aligned with the patients wishes.  Explanation of patient co-morbidities and poor clinical condition held. Patients sisters goals remain for improvement. She again wishes for a transfer to Joann Gutierrez as she Joann Gutierrez for Joann Gutierrez present state.  Discussed the importance of continued conversation with family and their  medical providers regarding overall plan of care and treatment options, ensuring decisions are within the context of the patients values and GOCs.  Decision Maker: Joann Gutierrez (sister) [phone]  SUMMARY OF RECOMMENDATIONS   Full Code/ Full Scope of Care --> Prior was DNAR/DNI on hospice care for 48hrs though family revoked that and had her return to HD given that they felt she had improved  Patient sister requests a transfer to Joann Gutierrez  Primary team updated on desire for all interventions  Ongoing [place] conversations in the oncoming days  Very difficult situation of a chronically ill F who has endured 3 acute hospitalization in the past two months. (+) multiple co-morbidities, (+) FTT, (+) bed-bound with sacral decubiti's and severe sepsis. Overall  outlook is quite poor though family remain hopeful for improvements.   Code Status/Advance Care Planning: FULL CODE   Palliative Prophylaxis:  Oral Care, Turn Q2H, Wound Care  Additional Recommendations (Limitations, Scope, Preferences): Full scope of care   Psycho-social/Spiritual:  Desire for further Chaplaincy support: Yes - [facility] Additional Recommendations: Education as above   Prognosis: If unable to get through sepsis prognosis will be quite poor.  Discharge Planning: Unclear  Vitals:   12/27/20 0619 12/27/20 0630  BP: (!) 98/52 (!) 105/49  Pulse:  (!) 108  Resp:  20  Temp:    SpO2:  100%    Intake/Output Summary (Last 24 hours) at 12/27/2020 8921 Last data filed at 12/27/2020 0630 Gross per 24 hour  Intake 1757.14 ml  Output --  Net 1757.14 ml   Last Weight  Most recent update: 01/12/2021  3:16 PM    Weight  73 kg (160 lb 15 oz)            Gen:  Chronically ill appearing AA F   HEENT: Dry mucous membranes CV: Irregular rate and rhythm PULM: On RA ABD: diffuse tenderness EXT: Generalized anasarca Neuro: Somnolent though opens eyes  PPS: 10%   This conversation/these recommendations were discussed  with patient primary care team, Dr. Erlinda Hong  Time In: 0640 Time Out: 0804 Total Time: 53 Greater than 50%  of this time was spent counseling and coordinating care related to the above assessment and plan.  [place] Team Team Cell Phone: [phone] Please utilize secure chat with additional questions, if there is no response within 30 minutes please call the above phone number  Palliative Medicine Team providers are available by phone from 7am to 7pm daily and can be reached through the team cell phone.  Should this patient require assistance outside of these hours, please call the patient's attending physician.

## 2020-12-27 NOTE — Significant Event (Signed)
Rapid Response Event Note   Reason for Call :  Hypotension  BP 73/27 Amiodarone gtt has been stopped  Initial Focused Assessment:  Patient is responsive with moan and grimace.  Her heart tones are irregular and lung sounds are diminished.  Her code status is DNR/DNI  BP 85/45  HR 128  RR 22  O2 sat 99% on RA While bolus infusing    Interventions:  500cc NS bolus 104/46  HR 130  Plan of Care:  Per CCM MD note: no CPR/defib, no intubation, no vasopressors, no CRRT, no escalation of care.     Event Summary:   MD Notified: Thang (triad admitting) Call Time: [place] Time: [place] Time: 1645  Raliegh Ip, RN

## 2020-12-27 NOTE — Progress Notes (Signed)
DSS states that at this moment Joann Gutierrez is the person that needs to be contacted for assistance with guardianship. Her numbers are [phone] and cell [phone]

## 2020-12-27 NOTE — ED Notes (Signed)
Attending paged regarding hypotension and irregular heart rate over 130.

## 2020-12-27 NOTE — Significant Event (Signed)
Patient is experiencing low blood pressure. Called MD about  Amio drip orders. Amio resumed per MD request.

## 2020-12-27 NOTE — ED Notes (Signed)
I cleaned pt's pressure ulcer with NS and applied santyl and a padded dressing to it. The wounds had a very foul smell. They were stage 2 wounds, two off them on coccyx area

## 2020-12-27 NOTE — Plan of Care (Signed)
  Interdisciplinary Goals of Care Family Meeting   Date carried out:: 12/27/2020  Location of the meeting:  numerous phone conversations and secure chats with various physicians, RN's, social workers and family members (pt's son and sister)  Member's involved: Physician, Bedside Registered Nurse, Education officer, museum, and Family Member or next of kin  Durable Power of Attorney or Loss adjuster, chartered: per hospital documentation it is son and sister however we have also been notified that pt has recently become a ward of the state and they no longer have medical decision making power.   Discussion: We discussed goals of care for Joann Gutierrez .  Discussion entailed her deterioration and recent admissions. It detailed her chronically ill state and now acute bacteremia with worsening sacral wound. We discussed her lack of Po intake for the past 2 weeks and the fact that she cont to lose weight. We discussed her need for dialysis as an esrd pt but that she could not tolerate nor should be a candidate for crrt. In light of these things I recommended that we cont to care for pt with antibiotics, close monitoring, fluids, blood etc but no aggressive measures with intubation, pressors, chest compressions, shocks etc that could become necessary. Both the son and sister acknowledged this. I also addressed with them the notification we received from her dialysis center that she was designated a ward of the state and the family was no longer the medical decision makers. [name] disclosed that she knew they had a court case about this and the state was taking over her affairs but they did not realize it was everything including medical. I explained that we do not have documentation of this yet but have requested the appropriate papers and documentation be provided to [place]. In the meantime I have spoken with Dr Joelyn Oms (nephrology) and him and I agree that while current care is warranted with abx and monitoring,  wound care and possible fluids there is futility in aggressive measures for this severely debilitated, severely malnourish patient. Should she improve enough to tolerated HD we may consider resuming but she is not a candidate for CRRT. We agree that acls/bls would also be futile and after discussion with family who expressed understanding we have changed code status to DNR/DNI while we await any official documentation from the state.   Code status: Full DNR  Disposition: Continue current acute care  Time spent for the meeting: 58

## 2020-12-27 NOTE — ED Notes (Addendum)
Notified Dr Theadora Rama from critical care of pt's hypotension via phone and she said she will come look at the pt. No orders given at this time

## 2020-12-27 NOTE — ED Notes (Signed)
Paged MD Chotiner about critical hemoglobin of 5.1

## 2020-12-27 NOTE — Consult Note (Signed)
NAME:  Joann Gutierrez, MRN:  [id number], DOB:  1945-03-14, LOS: 1 ADMISSION DATE:  12/24/2020 CONSULTATION DATE:  12/24/2020 REFERRING MD:  Francia Greaves - EDP CHIEF COMPLAINT:  Concern for sepsis  History of Present Illness:  75 year old woman who presented to [facility] ED via EMS 12/6 for increased drainage from sacral decubitus wound and concern for sepsis. 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 Recent admissions including 10/20-11/3 for hypoxia/acute LUL PE (with noted discharge on hospice care/DNR status) and 11/23-11/28 for hypoxia and PE (with reversal of DNR to full code status and request for full scope of care).  Upon speaking with pt's son he states he sent her to the hospital for wound care. He endorses that she has been otherwise in her normal state of health with the exception of lack of enteral intake about 2 weeks ago. He states that she will allow them to give her a spoonful of food every now and then but she "doesn't want to eat anymore". I explained that often that means the pt is ill or in her case with dementia that is part of the process of dying. I explained pt is ill and while we should do everything we can to help her that remaining full code would be futile and that should her heart stop she should be allowed to pass peacefully and with dignity at that time. He expressed understanding and stated he would be amendable to that decision once we spoke with the patients sister who was coming to bedside.   On ED arrival, patient was febrile to Tmax 100.52F, tachycardic to 130s with BP 105/63. Labs demonstrated WBC 50.6, Hgb 8.6 (baseline), INR 1.5 (on Eliquis), LA 6.9, BUN 62, Cr 9.7 (on HD), grossly normal LFTs. CXR was unremarkable. Blood and urine cultures were ordered. Empiric cefepime/vanc initiated for presumed sepsis.  12/27/2020  family has rescinded no CODE BLUE now is a full code.  Pertinent Medical History:   Past Medical History:  Diagnosis Date   Adenomatous colon polyp    Allergy    Anemia    Asthma        CAD (coronary artery disease)    Mild very minimal coronary disease with 20% obtuse marginal stenosis   Carpal tunnel syndrome on left    Cataract    CHF (congestive heart failure) (HCC)    Chronic kidney disease (CKD), stage III (moderate) (HCC)    now stage 4   COPD (chronic obstructive pulmonary disease) ([place])    Dementia with behavioral disturbance 03/09/2020   Depression    Diabetes mellitus 1997   Type II    Diverticulosis    Dyspnea    Elevated diaphragm November 2011   Right side   Esophageal dysmotility    Esophageal stricture    ESRD on hemodialysis ([place]) 03/09/2020   Gastritis    Gastroparesis 08/21/2007   GERD (gastroesophageal reflux disease)    Hearing loss of both ears    Hernia, hiatal    Hyperlipidemia    Hypertension    Morbid obesity ([place])    OSTEOARTHRITIS 08/09/2006   Osteoporosis    Oxygen deficiency    prn    PERIPHERAL NEUROPATHY, FEET 09/23/2007   RENAL INSUFFICIENCY 02/16/2009   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 Febrile with lactic acidosis and WBC 50K. BCx/Ucx pending. Cefepime/vanc started.  Interim History / Subjective:  Family is change CODE STATUS back to full  Objective:  Blood pressure (!) 83/40, pulse (!) 130, temperature (!) 97.4 F (36.3 C), temperature source Temporal, resp. rate (!) 29, height 5\' 4"  (1.626 m), weight 73 kg, SpO2 100 %.         Intake/Output Summary (Last 24 hours) at 12/27/2020 1011 Last data filed at 12/27/2020 0630 Gross per 24 hour  Intake 1757.14 ml  Output --  Net 1757.14 ml   Filed Weights   01/03/2021 1515  Weight: 73 kg   Physical Examination: General: Frail wasted female poorly responsive HEENT: Poor dentition Neuro: Does not respond to verbal stimuli CV:  PULM: Heart rate is 26 diminished throughout currently on room air  GI: soft, faint bowel sounds GU: Anuretic Extremities: warm/dry, 2+ edema  Skin: Sacral decubitus is unstageable     Resolved Hospital Problem List:    Assessment & Plan:  Concern for sepsis in the setting of increased wound drainage Lactic acidosis BIB [facility] after family called for increased sacral wound drainage. Febrile to 100.35F with leukocytosis to 50K, LA 6.9. Lactic acid is rising 3.6-5.8 on bicarb drip   Empirical antimicrobial therapy She will need dialysis Admit to intensive care unit  Anemia Recent Labs    12/27/20 0433 12/27/20 0821  HGB 5.5* 6.3*   Hold anticoagulation Transfuse 2 units of packed red blood cell   Pulmonary embolism Recent admissions 10/20-11/3 and 11/23-11/28 for PE. Heparin gtt while in the hospital, started on Eliquis at discharge. Variable/poor renal function complicating AC agent choice.  Heparin on hold due to hemoglobin 5.1  COPD OSA Currently on room air Urine bronchodilator Pulmonary toilet   ESRD on HD Lab Results  Component Value Date   CREATININE 8.87 (H) 12/27/2020   CREATININE 9.70 (H) 12/28/2020   CREATININE 8.72 (H) 12/15/2020   CREATININE 1.36 (H) 07/24/2011    Dialysis-dependent TTS. Dialyzes via R TDC.  Nephrology will need to be reconsulted and they have signed off Continue to monitor INO's Avoid nephrotoxins    Sacral decubitus wound    Wound ostomy care consult Unstageable sacral decubitus ulcer. Previously seen by [facility]. WOC consult  Severe protein calorie  malnutrition Severe deconditioning Wheelchair bound status Dementia Poor nutritional status at baseline, per son patient has not been eating well for several weeks.  Nutritional consult Questionable tube feedings PT and OT   GOC Pending trh conversation with pt's sister. [name] is amendable to DNR after it was explained in her malnourished and chronically ill state she would not survive chest compressions or be liberated from vent.  12/27/2020 pulmonary critical care called back as family has changed CODE STATUS to full.  Best Practice: (right click and "Reselect all SmartList Selections" daily)     Labs:  CBC: Recent Labs  Lab 12/21/2020 1503 12/27/20 0349 12/27/20 0433 12/27/20 0821  WBC 50.6* 31.0*  --  31.4*  NEUTROABS 39.5*  --   --   --   HGB 8.6* 5.1* 5.5* 6.3*  HCT 26.6* 16.6*  16.3* 18.2*  MCV 79.2* 84.3  --  76.5*  PLT 170 PLATELET CLUMPS NOTED ON SMEAR, UNABLE TO ESTIMATE  --  035*   Basic Metabolic Panel: Recent Labs  Lab 01/12/2021 1503 12/27/20 0300  NA 141 143  K 4.6 4.1  CL 101 109  CO2 17* 15*  GLUCOSE 333* 191*  BUN 62* 62*  CREATININE 9.70* 8.87*  CALCIUM 9.1 8.4*   GFR: Estimated Creatinine Clearance: 5.4 mL/min (A) (by C-G formula based on SCr of 8.87 mg/dL (H)). Recent Labs  Lab 12/27/2020 1503 12/28/2020 1716 01/14/2021 2106 12/27/20 0349 12/27/20 0821 12/27/20 0855  WBC 50.6*  --   --  31.0* 31.4*  --   LATICACIDVEN 6.9* 6.4* 3.6*  --   --  5.8*   Liver Function Tests: Recent Labs  Lab 01/16/2021 1503  AST 19  ALT 14  ALKPHOS 177*  BILITOT 1.1  PROT 6.8  ALBUMIN 1.7*   No results for input(s): LIPASE, AMYLASE in the last 168 hours. No results for input(s): AMMONIA in the last 168 hours.  ABG:    Component Value Date/Time   PHART 7.521 (H) 11/10/2020 0227   PCO2ART 29.0 (L) 11/10/2020 0227   PO2ART 137 (H) 11/10/2020 0227   HCO3 23.7 11/10/2020 0227   TCO2 25 11/10/2020 0227   ACIDBASEDEF 7.0 (H) 02/16/2020 0643   O2SAT  99.0 11/10/2020 0227    Coagulation Profile: Recent Labs  Lab 12/29/2020 1503  INR 1.5*   Cardiac Enzymes: No results for input(s): CKTOTAL, CKMB, CKMBINDEX, TROPONINI in the last 168 hours.  HbA1C: Hgb A1c MFr Bld  Date/Time Value Ref Range Status  11/10/2020 02:49 AM 5.0 4.8 - 5.6 % Final    Comment:    (NOTE) Pre diabetes:          5.7%-6.4%  Diabetes:              >6.4%  Glycemic control for   <7.0% adults with diabetes   04/02/2020 05:48 AM 6.1 (H) 4.8 - 5.6 % Final    Comment:    REPEATED TO VERIFY (NOTE) Pre diabetes:          5.7%-6.4%  Diabetes:              >6.4%  Glycemic control for   <7.0% adults with diabetes    CBG: Recent Labs  Lab 01/17/2021 2303 12/27/20 0746  GLUCAP 184* 212*     Critical care time: 34 min   Richardson Landry Leanza Shepperson ACNP Acute Care Nurse Practitioner [place] Please consult Amion 12/27/2020, 10:11 AM

## 2020-12-27 NOTE — Consult Note (Signed)
[place] Nurse wound consult note Consultation was completed by review of records, images and assistance from the bedside nurse/clinical staff.   Reason for Consult: sacral wound Patient from home; recently DC from inpatient stay (11/23-11/28) at which time she had several POA pressure injuries over the sacrum and buttocks. She has been under Hospice at some point, but appears to be full code and back on HD now.   Wound type: Unstageable Pressure Injury: sacrum  Stage 3 Pressure injury right ischium Stage 2 Pressure injury left ischium Partial thickness; abdomen unclear etiology   Pressure Injury POA: Yes/No/NA  Measurement: see nursing flow sheet  Wound bed: Sacrum; 70% pale, non granular/30% yellow/black slough Right and left ischium; 100% pink, pale, non granular Left lower abdomen; clean, pink with some scabbing   Drainage (amount, consistency, odor) unable to assess, no dressings in the images reviewed; presents pale and not soupy  Periwound: intact, evidence of re-epithelialization over the sacrum and buttocks in areas.   Dressing procedure/placement/frequency:  Add low air loss mattress while in the ED; requested RN to order  Add enzymatic debridement ointment to the sacral wound to clear necrotic tissue Silicone foam to the bilateral ischial wounds to protect and insulate wounds RD if appropriate to assess for supplementation for wound healing.   Discussed POC with patient and bedside nurse.  Re consult if needed, will not follow at this time. Thanks  Aliyha Fornes [employer], RN,CWOCN, CNS, [place] [phone])

## 2020-12-27 NOTE — Progress Notes (Signed)
   12/27/20 1645  Vitals  Temp 98.5 F (36.9 C)  Temp Source Tympanic  BP (!) 104/46  MAP (mmHg) (!) 60  BP Location Right Leg  BP Method Automatic  Patient Position (if appropriate) Lying  ECG Heart Rate (!) 131  Resp (!) 23  Level of Consciousness  Level of Consciousness Responds to Pain  MEWS COLOR  MEWS Score Color Red  Oxygen Therapy  SpO2 92 %  Pain Assessment  Pain Scale Faces  Pain Score 7  Height and Weight  Height 5\' 4"  (1.626 m)  Weight 72.6 kg  Type of Scale Used Bed  Type of Weight Actual  BSA (Calculated - sq m) 1.81 sq meters  BMI (Calculated) 27.45  Weight in (lb) to have BMI = 25 145.3  Glasgow Coma Scale  Eye Opening 2  Best Verbal Response (NON-intubated) 3  Modified Verbal Response (INTUBATED) 3  Best Motor Response 3  Glasgow Coma Scale Score 11  Unable to Complete GCS due to  Other (comment)  MEWS Score  MEWS Temp 0  MEWS Systolic 0  MEWS Pulse 3  MEWS RR 1  MEWS LOC 2  MEWS Score 6  Provider Notification  Provider Name/Title Earlean Polka  Date Provider Notified 12/27/20  Time Provider Notified 1600  Notification Type Page  Notification Reason Change in status (Low blood pressures)  Provider response See new orders  Date of Provider Response 12/27/20  Time of Provider Response 1613  Rapid Response Notification  Name of Rapid Response RN Notified Helle,layton  Date Rapid Response Notified 12/27/20  Time Rapid Response Notified 22  See MD Notes and orders

## 2020-12-27 NOTE — Progress Notes (Signed)
Patient sister Joann Gutierrez came to visit. She has No question at this Time.   Got report from day shift: [name] [phone] / [phone] be contacted for assistance with guardianship.    Received Pt with amio Qtt running at 30mg /hr.  BP still low. HR sustaining at 110-130s. Pt still responsive to pain.

## 2020-12-27 NOTE — ED Notes (Signed)
Pt's Sa02 showing good pleth at times, but won't register a number.,

## 2020-12-27 NOTE — Progress Notes (Addendum)
Assessed patient at bedside for call regarding hypotension.  SBP reading in 80's on RLE with large cuff.  Cuff repositioned and SBP reading 90-100's.  AM labs reviewed.  Nephrology note reviewed regarding eligibility for HD.  The patient is lying on ER stretcher in no distress on RA, normal work of breathing. No acute distress.  S/p 2 units PRBC's.  Remains on amiodarone infusion with tele showing AF 120-130's.  Per RN, there is a question of who is HCPOA in family / she may actually be a ward of the state.    Plan: -lower MAP goal to 55 mgHg -3 amps sodium bicarbonate now  -could try midodrine if she is able to take PO's  -suspect she would not be an HD candidate       Noe Gens, MSN, APRN, NP-C, AGACNP-BC [facility] 12/27/2020, 2:46 PM   Please see [email or web address] for pager details.   From 7A-7P if no response, please call [phone] After hours, please call ELink [phone]

## 2020-12-27 NOTE — Progress Notes (Signed)
PHARMACY - PHYSICIAN COMMUNICATION CRITICAL VALUE ALERT - BLOOD CULTURE IDENTIFICATION (BCID)  Joann Gutierrez is an 75 y.o. female who presented to [facility] on 01/05/2021 with a chief complaint of increased drainage from stage IV sacral decubitus.   Assessment: 71 YOF with ESRD and increased drainage from sacral decubitus concerning for sepsis. Now with both bottles of one set growing GNR with proteus on BCID with no resistance detected. Noted another set ordered but not received in the micro lab.   Name of physician (or Provider) Contacted: Xu  Current antibiotics: Vancomycin + Cefepime + Flagyl  Changes to prescribed antibiotics recommended:  Adjust to Rocephin 2g IV every 24 hours  Results for orders placed or performed during the hospital encounter of 02/15/20  Blood Culture ID Panel (Reflexed) (Collected: 02/15/2020  9:04 PM)  Result Value Ref Range   Enterococcus faecalis NOT DETECTED NOT DETECTED   Enterococcus Faecium NOT DETECTED NOT DETECTED   Listeria monocytogenes NOT DETECTED NOT DETECTED   Staphylococcus species NOT DETECTED NOT DETECTED   Staphylococcus aureus (BCID) NOT DETECTED NOT DETECTED   Staphylococcus epidermidis NOT DETECTED NOT DETECTED   Staphylococcus lugdunensis NOT DETECTED NOT DETECTED   Streptococcus species NOT DETECTED NOT DETECTED   Streptococcus agalactiae NOT DETECTED NOT DETECTED   Streptococcus pneumoniae NOT DETECTED NOT DETECTED   Streptococcus pyogenes NOT DETECTED NOT DETECTED   A.calcoaceticus-baumannii NOT DETECTED NOT DETECTED   Bacteroides fragilis NOT DETECTED NOT DETECTED   Enterobacterales NOT DETECTED NOT DETECTED   Enterobacter cloacae complex NOT DETECTED NOT DETECTED   Escherichia coli NOT DETECTED NOT DETECTED   Klebsiella aerogenes NOT DETECTED NOT DETECTED   Klebsiella oxytoca NOT DETECTED NOT DETECTED   Klebsiella pneumoniae NOT DETECTED NOT DETECTED   Proteus species NOT DETECTED NOT DETECTED   Salmonella species NOT  DETECTED NOT DETECTED   Serratia marcescens NOT DETECTED NOT DETECTED   Haemophilus influenzae NOT DETECTED NOT DETECTED   Neisseria meningitidis NOT DETECTED NOT DETECTED   Pseudomonas aeruginosa NOT DETECTED NOT DETECTED   Stenotrophomonas maltophilia NOT DETECTED NOT DETECTED   Candida albicans NOT DETECTED NOT DETECTED   Candida auris NOT DETECTED NOT DETECTED   Candida glabrata NOT DETECTED NOT DETECTED   Candida krusei NOT DETECTED NOT DETECTED   Candida parapsilosis NOT DETECTED NOT DETECTED   Candida tropicalis NOT DETECTED NOT DETECTED   Cryptococcus neoformans/gattii NOT DETECTED NOT DETECTED    Thank you for allowing pharmacy to be a part of this patient's care.  Alycia Rossetti, PharmD, BCPS Clinical Pharmacist 12/27/2020 8:20 AM   **Pharmacist phone directory can now be found on [email or web address] ([email or web address]).  Listed under [place].

## 2020-12-27 NOTE — Progress Notes (Signed)
Admit: 01/09/2021 LOS: 1  2F ESRD FTT, recent return to HD after being on hospice, sepsis, severe anemia, recent PE, large sacral decubitus ulcer  Subjective:  Bacteremic Transfused Unresponsive Some informatino received she is now a ward of he state, discussed with [facility] SW who confirmed this and are working to obtain documentation In RVR  12/06 0701 - 12/07 0700 In: 1757.1 [I.V.:415.1; IV Piggyback:1342.1] Out: -   Filed Weights   01/16/2021 1515  Weight: 73 kg    Scheduled Meds:  sodium chloride   Intravenous Once   acetaminophen  650 mg Oral Once   collagenase   Topical Daily   insulin aspart  0-6 Units Subcutaneous TID WC   pantoprazole (PROTONIX) IV  40 mg Intravenous Q24H   Continuous Infusions:  albumin human Stopped (12/27/20 1451)   amiodarone 60 mg/hr (12/27/20 1337)   Followed by   amiodarone     cefTRIAXone (ROCEPHIN)  IV     PRN Meds:.acetaminophen **OR** acetaminophen, ondansetron **OR** ondansetron (ZOFRAN) IV  Current Labs: reviewed   Physical Exam:  Blood pressure (!) 94/44, pulse (!) 126, temperature 97.6 F (36.4 C), temperature source Temporal, resp. rate (!) 22, height 5\' 4"  (1.626 m), weight 73 kg, SpO2 100 %. Profoundly deconditioned Tachycardic, irregular Coarse BS b/l Sacral wound noted Unresponsive TDC  A ESRD, not stable for iHD, ongoing dialysis is futile and will not benefit her Severe anemia, transfused Recent PE, holding AC with #2 AFib/RVR per primary Metabolic acidosis Sacral decubitus ulcer Sepsis Advanced dementia COPD ?Ward of the state  P She is not stable for iHD, and CRRT will not benefit her; she should not receive HD Guardianship issues to be resolved   Pearson Grippe MD 12/27/2020, 3:01 PM  Recent Labs  Lab 01/05/2021 1503 12/27/20 0300  NA 141 143  K 4.6 4.1  CL 101 109  CO2 17* 15*  GLUCOSE 333* 191*  BUN 62* 62*  CREATININE 9.70* 8.87*  CALCIUM 9.1 8.4*   Recent Labs  Lab 01/06/2021 1503  12/27/20 0349 12/27/20 0433 12/27/20 0821  WBC 50.6* 31.0*  --  31.4*  NEUTROABS 39.5*  --   --   --   HGB 8.6* 5.1* 5.5* 6.3*  HCT 26.6* 16.6* 16.3* 18.2*  MCV 79.2* 84.3  --  76.5*  PLT 170 PLATELET CLUMPS NOTED ON SMEAR, UNABLE TO ESTIMATE  --  104*

## 2020-12-27 NOTE — Progress Notes (Signed)
Pt with very limited access and R arm restriction. Suggested to the MD (Dr. Erlinda Hong) the pt may benefit from a CL/PICC line, as amiodarone will also be added to the tx plan. Primary RN at bedside and aware of suggestions.

## 2020-12-28 LAB — TYPE AND SCREEN
ABO/RH(D): O POS
Antibody Screen: NEGATIVE
Unit division: 0
Unit division: 0

## 2020-12-28 LAB — BPAM RBC
Blood Product Expiration Date: 202212132359
Blood Product Expiration Date: 202212132359
ISSUE DATE / TIME: 202212070753
ISSUE DATE / TIME: 202212071020
Unit Type and Rh: 5100
Unit Type and Rh: 5100

## 2020-12-28 LAB — CBC
HCT: 26.3 % — ABNORMAL LOW (ref 36.0–46.0)
Hemoglobin: 9.5 g/dL — ABNORMAL LOW (ref 12.0–15.0)
MCH: 27.9 pg (ref 26.0–34.0)
MCHC: 36.1 g/dL — ABNORMAL HIGH (ref 30.0–36.0)
MCV: 77.1 fL — ABNORMAL LOW (ref 80.0–100.0)
Platelets: 76 10*3/uL — ABNORMAL LOW (ref 150–400)
RBC: 3.41 MIL/uL — ABNORMAL LOW (ref 3.87–5.11)
RDW: 18.7 % — ABNORMAL HIGH (ref 11.5–15.5)
WBC: 23.5 10*3/uL — ABNORMAL HIGH (ref 4.0–10.5)
nRBC: 2.1 % — ABNORMAL HIGH (ref 0.0–0.2)

## 2020-12-28 LAB — GLUCOSE, CAPILLARY
Glucose-Capillary: 185 mg/dL — ABNORMAL HIGH (ref 70–99)
Glucose-Capillary: 157 mg/dL — ABNORMAL HIGH (ref 70–99)
Glucose-Capillary: 77 mg/dL (ref 70–99)

## 2020-12-28 MED ORDER — CHLORHEXIDINE GLUCONATE CLOTH 2 % EX PADS
6.0000 | MEDICATED_PAD | Freq: Every day | CUTANEOUS | Status: DC
  Administered 2020-12-28: 6 via TOPICAL

## 2020-12-28 MED ORDER — ALBUTEROL SULFATE (2.5 MG/3ML) 0.083% IN NEBU: 2.5000 mg | INHALATION_SOLUTION | RESPIRATORY_TRACT | Status: DC | PRN

## 2020-12-28 MED ORDER — POLYVINYL ALCOHOL 1.4 % OP SOLN
1.0000 [drp] | Freq: Four times a day (QID) | OPHTHALMIC | Status: DC | PRN
  Filled 2020-12-28: qty 15

## 2020-12-28 MED ORDER — MORPHINE SULFATE (PF) 2 MG/ML IV SOLN
1.0000 mg | INTRAVENOUS | Status: DC | PRN
  Administered 2020-12-28 – 2020-12-29 (×2): 1 mg via INTRAVENOUS
  Filled 2020-12-28 (×2): qty 1

## 2020-12-28 MED ORDER — ACETAMINOPHEN 325 MG PO TABS: 650.0000 mg | ORAL_TABLET | Freq: Four times a day (QID) | ORAL | Status: DC | PRN

## 2020-12-28 MED ORDER — ATROPINE SULFATE 1 % OP SOLN
4.0000 [drp] | OPHTHALMIC | Status: DC | PRN
  Filled 2020-12-28: qty 2

## 2020-12-28 MED ORDER — HALOPERIDOL LACTATE 5 MG/ML IJ SOLN: 0.5000 mg | INTRAMUSCULAR | Status: DC | PRN

## 2020-12-28 MED ORDER — ONDANSETRON HCL 4 MG/2ML IJ SOLN: 4.0000 mg | Freq: Four times a day (QID) | INTRAMUSCULAR | Status: DC | PRN

## 2020-12-28 MED ORDER — ONDANSETRON 4 MG PO TBDP
4.0000 mg | ORAL_TABLET | Freq: Four times a day (QID) | ORAL | Status: DC | PRN
  Filled 2020-12-28: qty 1

## 2020-12-28 MED ORDER — ACETAMINOPHEN 650 MG RE SUPP: 650.0000 mg | Freq: Four times a day (QID) | RECTAL | Status: DC | PRN

## 2020-12-28 MED ORDER — HALOPERIDOL 0.5 MG PO TABS
0.5000 mg | ORAL_TABLET | ORAL | Status: DC | PRN
  Filled 2020-12-28: qty 1

## 2020-12-28 MED ORDER — HALOPERIDOL LACTATE 2 MG/ML PO CONC
0.5000 mg | ORAL | Status: DC | PRN
  Filled 2020-12-28: qty 0.3

## 2020-12-28 MED ORDER — DEXTROSE 50 % IV SOLN
INTRAVENOUS | Status: AC
  Filled 2020-12-28: qty 50

## 2020-12-28 MED ORDER — BISACODYL 10 MG RE SUPP: 10.0000 mg | Freq: Every day | RECTAL | Status: DC | PRN

## 2020-12-28 MED ORDER — LORAZEPAM 2 MG/ML IJ SOLN: 1.0000 mg | INTRAMUSCULAR | Status: DC | PRN

## 2020-12-28 NOTE — Progress Notes (Signed)
Admit: 01/09/2021 LOS: 2  16F ESRD FTT, recent return to HD after being on hospice, sepsis, severe anemia, recent PE, large sacral decubitus ulcer  Subjective:  Documents completed last night for DNR and no further HD, submitted to social services She remains unresponsive, grimacing  12/07 0701 - 12/08 0700 In: 1567.4 [I.V.:1013.3; Blood:415.2; IV Piggyback:138.9] Out: 0   Filed Weights   01/11/2021 1515 12/27/20 1645 12/28/20 0402  Weight: 73 kg 72.6 kg 72.6 kg    Scheduled Meds:  sodium chloride   Intravenous Once   acetaminophen  650 mg Oral Once   Chlorhexidine Gluconate Cloth  6 each Topical Daily   collagenase   Topical Daily   insulin aspart  0-6 Units Subcutaneous TID WC   pantoprazole (PROTONIX) IV  40 mg Intravenous Q24H   Continuous Infusions:  amiodarone 30 mg/hr (12/28/20 1012)   cefTRIAXone (ROCEPHIN)  IV 2 g (12/27/20 1741)   PRN Meds:.acetaminophen **OR** acetaminophen, ondansetron **OR** ondansetron (ZOFRAN) IV  Current Labs: reviewed   Physical Exam:  Blood pressure (!) 100/53, pulse (!) 115, temperature 98.2 F (36.8 C), temperature source Axillary, resp. rate 20, height 5\' 4"  (1.626 m), weight 72.6 kg, SpO2 97 %. Profoundly deconditioned Temporal wasting Tachycardic, irregular Coarse BS b/l Sacral wound noted Unresponsive TDC  A ESRD, not stable for iHD, ongoing dialysis is futile and will not benefit her Severe anemia, transfused Recent PE, holding AC with #2 AFib/RVR per primary Metabolic acidosis Sacral decubitus ulcer Sepsis Advanced dementia COPD Ward of the state  P She is not stable nor will she benefit from any form or RRT Recommend focusing on symptom control, comfort Will be available, no further recommendations  Pearson Grippe MD 12/28/2020, 10:47 AM  Recent Labs  Lab 01/02/2021 1503 12/27/20 0300  NA 141 143  K 4.6 4.1  CL 101 109  CO2 17* 15*  GLUCOSE 333* 191*  BUN 62* 62*  CREATININE 9.70* 8.87*  CALCIUM 9.1 8.4*     Recent Labs  Lab 01/11/2021 1503 12/27/20 0349 12/27/20 0821 12/27/20 1624 12/28/20 0319  WBC 50.6*   < > 31.4* 24.5* 23.5*  NEUTROABS 39.5*  --   --   --   --   HGB 8.6*   < > 6.3* 9.6* 9.5*  HCT 26.6*   < > 18.2* 27.6* 26.3*  MCV 79.2*   < > 76.5* 79.5* 77.1*  PLT 170   < > 104* 86* 76*   < > = values in this interval not displayed.

## 2020-12-28 NOTE — Progress Notes (Signed)
   12/28/20 1000  Assess: MEWS Score  Temp 97.8 F (36.6 C)  BP (!) 81/41  ECG Heart Rate (!) 124  Resp 17  Level of Consciousness Alert (non verbal)  SpO2 95 %  O2 Device Room Air  Assess: MEWS Score  MEWS Temp 0  MEWS Systolic 1  MEWS Pulse 2  MEWS RR 0  MEWS LOC 0  MEWS Score 3  MEWS Score Color Yellow  Assess: if the MEWS score is Yellow or Red  Were vital signs taken at a resting state? Yes  Focused Assessment No change from prior assessment  Early Detection of Sepsis Score *See Row Information* Medium  MEWS guidelines implemented *See Row Information* No, previously red, continue vital signs every 4 hours  Treat  Pain Scale 0-10  Pain Score 0  Take Vital Signs  Increase Vital Sign Frequency  Red: Q 1hr X 4 then Q 4hr X 4, if remains red, continue Q 4hrs  Escalate  MEWS: Escalate Red: discuss with charge nurse/RN and provider, consider discussing with RRT  Notify: Charge Nurse/RN  Name of Charge Nurse/RN Notified Bonney Roussel  Date Charge Nurse/RN Notified 12/28/20  Time Charge Nurse/RN Notified 0930  Notify: Provider  Provider Name/Title Dr. Erlinda Hong  Date Provider Notified 12/28/20  Time Provider Notified 0030  Notification Type Page  Notification Reason Other (Comment) (ongoing status)  Provider response No new orders  Date of Provider Response 12/28/20  Time of Provider Response 0900  Notify: Rapid Response  Name of Rapid Response RN Notified n/a  Document  Patient Outcome Not stable and remains on department  Progress note created (see row info) Yes  Patient is awake with movement with no command. Patient maintaining low blood pressure with MAP > 55

## 2020-12-28 NOTE — Progress Notes (Signed)
PROGRESS NOTE    Joann Gutierrez  GEX:[id number] DOB: 06-08-1945 DOA: 12/25/2020 PCP: Andree Moro, DO    Chief Complaint  Patient presents with   Wound Infection    Brief Narrative:  Joann Gutierrez is a 75 y.o. female with medical history significant of ESRD on HD TTS, chronic sacral pressure ulcer, PE on Eliquis, COPD, OSA, chronic ambulation dysfunction wheelchair-bound, was discharged home on hospice recently , appear family has reversed the decision , she is sent back to the hospital admitted for worsening of sacral pressure ulcer and fever.    Subjective:  She is a ward of state, nephrology do not recommend further dialysis, case discussed with Joann Gutierrez who give permission to transition care to full comfort measures and proceed with hospice care Per  Joann Gutierrez from Joann Gutierrez family can contact her if any questions regarding the plan ,  Patient is with advanced Dementia , not communicating, not following commands, only grimacing to pain at times   Assessment & Plan:   Principal Problem:   Sepsis ([place]) Active Problems:   Severe sepsis ([place])   Sepsis present on admission from stage IV sacral decubitus ulcer/bacteremia -Was on broad-spectrum antibiotics, antibiotic adjusted according to [facility] ID -case discussed with critical care/nephrology patient is made DNR, no escalation of care due to futility of care in the setting of advanced dementia -She is a ward of state, nephrology do not recommend further dialysis, case discussed with Joann Gutierrez who give permission to transition care to full comfort measures and proceed with hospice care  A. fib RVR New diagnosis Start amiodarone drip due to hypotension She is a ward of state, nephrology do not recommend further dialysis, case discussed with Joann Gutierrez who give permission to transition care to full comfort measures and proceed with hospice care  Acute on chronic anemia -Received 4 units PRBC transfusion, hemoglobin has improved  posttransfusion Does not appear to have external bleed -Anticoagulation held -She is a ward of state, nephrology do not recommend further dialysis, case discussed with Joann Gutierrez who give permission to transition care to full comfort measures and proceed with hospice care  Recent diagnosis of PE Anticoagulation held due to acute on chronic anemia  End-stage renal disease Not a candidate for HD due to hypotension Nephrology consulted Critical care consulted Per outpatient dialysis unit, patient now is a ward of state Joann Gutierrez states that at this moment Joann Gutierrez is the person that needs to be contacted for assistance with guardianship. Her numbers are [phone] and cell [phone]  Patient is made DNR, no escalation of care, appreciate  critical care /nephrology input - nephrology do not recommend further dialysis, case discussed with Joann Gutierrez  Joann Gutierrez on 12/8 who give permission to transition care to full comfort measures and proceed with hospice care  FTT: Per family patient has mostly bed to wheelchair bound since January 2022, has significant weight loss of 40-60 pounds The patient's BMI is: Body mass index is 27.46 kg/m.. I have talked to the sister over the phone on 12/7 am  (who reportedly is healthcare power of attorney initially) , I explained to sister that CPR or intubation would not provide benefit to this patient with advanced dementia, progressive weight loss,  bed to wheelchair bound since January 2022 , she expressed understanding Palliative consulted as well, appreciate input   nephrology do not recommend further dialysis, case discussed with Joann Gutierrez  Joann Gutierrez on 12/8 who give permission to transition care to full comfort measures and proceed with hospice care    .  Skin Assessment:  I have examined the patient's skin and I agree with the wound assessment as performed by the wound care RN as outlined below:  Pressure Injury 11/10/20 Buttocks Left Stage 2 -  Partial thickness  loss of dermis presenting as a shallow open injury with a red, pink wound bed without slough. (Active)  11/10/20 1921  Location: Buttocks  Location Orientation: Left  Staging: Stage 2 -  Partial thickness loss of dermis presenting as a shallow open injury with a red, pink wound bed without slough.  Wound Description (Comments):   Present on Admission: Yes     Pressure Injury 11/14/20 Buttocks Right Stage 2 -  Partial thickness loss of dermis presenting as a shallow open injury with a red, pink wound bed without slough. (Active)  11/14/20 1400  Location: Buttocks  Location Orientation: Right  Staging: Stage 2 -  Partial thickness loss of dermis presenting as a shallow open injury with a red, pink wound bed without slough.  Wound Description (Comments):   Present on Admission: No        DVT prophylaxis: was on heparin drip, held due to acute anemia   Code Status: DNR , no escalation of care per critical care /nephrology Family Communication: Joann Gutierrez Joann Gutierrez over the phone on 12/8 Disposition:   Status is: Inpatient  Dispo: The patient is from: home              Anticipated d/c is to: in hospital death vs hospice facility placement              Anticipated d/c date is: TBD                Consultants:  Critical care Nephrology  Procedures:  none  Antimicrobials:    Anti-infectives (From admission, onward)    Start     Dose/Rate Route Frequency Ordered Stop   12/27/20 1800  ceFEPIme (MAXIPIME) 1 g in sodium chloride 0.9 % 100 mL IVPB  Status:  Discontinued        1 g 200 mL/hr over 30 Minutes Intravenous Every 24 hours 01/09/2021 2208 12/27/20 0846   12/27/20 1800  cefTRIAXone (ROCEPHIN) 2 g in sodium chloride 0.9 % 100 mL IVPB        2 g 200 mL/hr over 30 Minutes Intravenous Every 24 hours 12/27/20 0846     12/29/2020 2000  metroNIDAZOLE (FLAGYL) IVPB 500 mg  Status:  Discontinued        500 mg 100 mL/hr over 60 Minutes Intravenous Every 12 hours 01/17/2021 1904 12/27/20 0846    12/22/2020 1530  vancomycin (VANCOREADY) IVPB 1500 mg/300 mL        1,500 mg 150 mL/hr over 120 Minutes Intravenous  Once 01/19/2021 1456 01/12/2021 1804   12/25/2020 1515  ceFEPIme (MAXIPIME) 2 g in sodium chloride 0.9 % 100 mL IVPB        2 g 200 mL/hr over 30 Minutes Intravenous  Once 01/03/2021 1456 12/24/2020 1548          Objective: Vitals:   12/28/20 1134 12/28/20 1200 12/28/20 1433 12/28/20 1553  BP: (!) 90/54 (!) 86/59 (!) 90/46 (!) 89/46  Pulse: (!) 117   (!) 120  Resp: 19  20 17   Temp: 97.8 F (36.6 C)     TempSrc: Axillary     SpO2:    95%  Weight:      Height:        Intake/Output Summary (Last 24 hours) at 12/28/2020 1716  Last data filed at 12/28/2020 0600 Gross per 24 hour  Intake 363.34 ml  Output 0 ml  Net 363.34 ml   Filed Weights   12/21/2020 1515 12/27/20 1645 12/28/20 0402  Weight: 73 kg 72.6 kg 72.6 kg    Examination:  General exam:  open eyes at times, does not track with eyes, does not follow command ,moaning/grimacing  at times Respiratory system:  diminished at bases, no wheezing. Respiratory effort normal. Cardiovascular system:  IRRR.  Gastrointestinal system: Abdomen is nondistended, soft and nontender.  Normal bowel sounds heard. Central nervous system: Advanced dementia. Appear to have chronic contracture right upper extremity  Extremities:  no edema Skin: No rashes, lesions or ulcers Psychiatry: Advanced dementia.     Data Reviewed: I have personally reviewed following labs and imaging studies  CBC: Recent Labs  Lab 12/28/2020 1503 12/27/20 0349 12/27/20 0433 12/27/20 0821 12/27/20 1624 12/28/20 0319  WBC 50.6* 31.0*  --  31.4* 24.5* 23.5*  NEUTROABS 39.5*  --   --   --   --   --   HGB 8.6* 5.1* 5.5* 6.3* 9.6* 9.5*  HCT 26.6* 16.6* 16.3* 18.2* 27.6* 26.3*  MCV 79.2* 84.3  --  76.5* 79.5* 77.1*  PLT 170 PLATELET CLUMPS NOTED ON SMEAR, UNABLE TO ESTIMATE  --  104* 86* 76*    Basic Metabolic Panel: Recent Labs  Lab 01/08/2021 1503  12/27/20 0300  NA 141 143  K 4.6 4.1  CL 101 109  CO2 17* 15*  GLUCOSE 333* 191*  BUN 62* 62*  CREATININE 9.70* 8.87*  CALCIUM 9.1 8.4*    GFR: Estimated Creatinine Clearance: 5.4 mL/min (A) (by C-G formula based on SCr of 8.87 mg/dL (H)).  Liver Function Tests: Recent Labs  Lab 01/07/2021 1503  AST 19  ALT 14  ALKPHOS 177*  BILITOT 1.1  PROT 6.8  ALBUMIN 1.7*    CBG: Recent Labs  Lab 12/27/20 1852 12/27/20 2102 12/28/20 0613 12/28/20 1133 12/28/20 1552  GLUCAP 228* 181* 185* 157* 77     Recent Results (from the past 240 hour(s))  Resp Panel by RT-PCR (Flu A&B, Covid) Nasopharyngeal Swab     Status: None   Collection Time: 01/19/2021  2:48 PM   Specimen: Nasopharyngeal Swab; Nasopharyngeal(NP) swabs in vial transport medium  Result Value Ref Range Status   SARS Coronavirus 2 by RT PCR NEGATIVE NEGATIVE Final    Comment: (NOTE) SARS-CoV-2 target nucleic acids are NOT DETECTED.  The SARS-CoV-2 RNA is generally detectable in upper respiratory specimens during the acute phase of infection. The lowest concentration of SARS-CoV-2 viral copies this assay can detect is 138 copies/mL. A negative result does not preclude SARS-Cov-2 infection and should not be used as the sole basis for treatment or other patient management decisions. A negative result may occur with  improper specimen collection/handling, submission of specimen other than nasopharyngeal swab, presence of viral mutation(s) within the areas targeted by this assay, and inadequate number of viral copies(<138 copies/mL). A negative result must be combined with clinical observations, patient history, and epidemiological information. The expected result is Negative.  Fact Sheet for Patients:  EntrepreneurPulse.com.au  Fact Sheet for Healthcare Providers:  IncredibleEmployment.be  This test is no t yet approved or cleared by the [place] FDA and  has been  authorized for detection and/or diagnosis of SARS-CoV-2 by FDA under an Emergency Use Authorization (EUA). This EUA will remain  in effect (meaning this test can be used) for the duration of the COVID-19  declaration under Section 564(b)(1) of the Act, 21 U.S.C.section 360bbb-3(b)(1), unless the authorization is terminated  or revoked sooner.       Influenza A by PCR NEGATIVE NEGATIVE Final   Influenza B by PCR NEGATIVE NEGATIVE Final    Comment: (NOTE) The Xpert Xpress SARS-CoV-2/FLU/RSV plus assay is intended as an aid in the diagnosis of influenza from Nasopharyngeal swab specimens and should not be used as a sole basis for treatment. Nasal washings and aspirates are unacceptable for Xpert Xpress SARS-CoV-2/FLU/RSV testing.  Fact Sheet for Patients: EntrepreneurPulse.com.au  Fact Sheet for Healthcare Providers: IncredibleEmployment.be  This test is not yet approved or cleared by the [place] FDA and has been authorized for detection and/or diagnosis of SARS-CoV-2 by FDA under an Emergency Use Authorization (EUA). This EUA will remain in effect (meaning this test can be used) for the duration of the COVID-19 declaration under Section 564(b)(1) of the Act, 21 U.S.C. section 360bbb-3(b)(1), unless the authorization is terminated or revoked.  Performed at [facility], [place] [address], [place], [place] [address]   Blood Culture (routine x 2)     Status: Abnormal (Preliminary result)   Collection Time: 01/05/2021  3:03 PM   Specimen: BLOOD  Result Value Ref Range Status   Specimen Description BLOOD LEFT ANTECUBITAL  Final   Special Requests   Final    BOTTLES DRAWN AEROBIC AND ANAEROBIC Blood Culture adequate volume   Culture  Setup Time   Final    GRAM NEGATIVE RODS IN BOTH AEROBIC AND ANAEROBIC BOTTLES CRITICAL RESULT CALLED TO, READ BACK BY AND VERIFIED WITH: PHARMD E.MARTIN AT 9937 ON 12/27/2020 BY T.SAAD.    Culture (A)   Final    PROTEUS MIRABILIS CULTURE REINCUBATED FOR BETTER GROWTH Performed at [facility], [place] [address], [place], [place] [address]    Report Status PENDING  Incomplete  Blood Culture ID Panel (Reflexed)     Status: Abnormal   Collection Time: 12/23/2020  3:03 PM  Result Value Ref Range Status   Enterococcus faecalis NOT DETECTED NOT DETECTED Final   Enterococcus Faecium NOT DETECTED NOT DETECTED Final   Listeria monocytogenes NOT DETECTED NOT DETECTED Final   Staphylococcus species NOT DETECTED NOT DETECTED Final   Staphylococcus aureus (BCID) NOT DETECTED NOT DETECTED Final   Staphylococcus epidermidis NOT DETECTED NOT DETECTED Final   Staphylococcus lugdunensis NOT DETECTED NOT DETECTED Final   Streptococcus species NOT DETECTED NOT DETECTED Final   Streptococcus agalactiae NOT DETECTED NOT DETECTED Final   Streptococcus pneumoniae NOT DETECTED NOT DETECTED Final   Streptococcus pyogenes NOT DETECTED NOT DETECTED Final   A.calcoaceticus-baumannii NOT DETECTED NOT DETECTED Final   Bacteroides fragilis NOT DETECTED NOT DETECTED Final   Enterobacterales DETECTED (A) NOT DETECTED Final    Comment: Enterobacterales represent a large order of gram negative bacteria, not a single organism. CRITICAL RESULT CALLED TO, READ BACK BY AND VERIFIED WITH: PHARMD E.MARTIN AT 8938 ON 12/27/2020 BY T.SAAD.    Enterobacter cloacae complex NOT DETECTED NOT DETECTED Final   Escherichia coli NOT DETECTED NOT DETECTED Final   Klebsiella aerogenes NOT DETECTED NOT DETECTED Final   Klebsiella oxytoca NOT DETECTED NOT DETECTED Final   Klebsiella pneumoniae NOT DETECTED NOT DETECTED Final   Proteus species DETECTED (A) NOT DETECTED Final    Comment: CRITICAL RESULT CALLED TO, READ BACK BY AND VERIFIED WITH: PHARMD E.MARTIN AT 1017 ON 12/27/2020 BY T.SAAD.    Salmonella species NOT DETECTED NOT DETECTED Final   Serratia marcescens  NOT DETECTED NOT DETECTED Final   Haemophilus influenzae NOT  DETECTED NOT DETECTED Final   Neisseria meningitidis NOT DETECTED NOT DETECTED Final   Pseudomonas aeruginosa NOT DETECTED NOT DETECTED Final   Stenotrophomonas maltophilia NOT DETECTED NOT DETECTED Final   Candida albicans NOT DETECTED NOT DETECTED Final   Candida auris NOT DETECTED NOT DETECTED Final   Candida glabrata NOT DETECTED NOT DETECTED Final   Candida krusei NOT DETECTED NOT DETECTED Final   Candida parapsilosis NOT DETECTED NOT DETECTED Final   Candida tropicalis NOT DETECTED NOT DETECTED Final   Cryptococcus neoformans/gattii NOT DETECTED NOT DETECTED Final   CTX-M ESBL NOT DETECTED NOT DETECTED Final   Carbapenem resistance IMP NOT DETECTED NOT DETECTED Final   Carbapenem resistance KPC NOT DETECTED NOT DETECTED Final   Carbapenem resistance NDM NOT DETECTED NOT DETECTED Final   Carbapenem resist OXA 48 LIKE NOT DETECTED NOT DETECTED Final   Carbapenem resistance VIM NOT DETECTED NOT DETECTED Final    Comment: Performed at [facility], [address] [place], [place] [address]         Radiology Studies: DG Chest [place] 1 View  Result Date: 12/27/2020 CLINICAL DATA:  75 year old female with history of wound infection. Sepsis. EXAM: PORTABLE CHEST 1 VIEW COMPARISON:  Chest x-ray 01/15/2021. FINDINGS: Right internal jugular PermCath with tip terminating in the inferior aspect of the right atrium. Chronic elevation of the right hemidiaphragm, similar to the prior study. Lung volumes are low. No consolidative airspace disease. No pleural effusions. No pneumothorax. No pulmonary nodule or mass noted. Pulmonary vasculature and the cardiomediastinal silhouette are within normal limits. Atherosclerotic calcifications in the thoracic aorta. Healing nondisplaced fracture of the lateral aspect of the left ninth rib is noted. IMPRESSION: 1. Low lung volumes without radiographic evidence of acute cardiopulmonary disease. 2. Aortic atherosclerosis. 3. Healing nondisplaced left lateral  ninth rib fracture. Electronically Signed   By: Vinnie Langton M.D.   On: 12/27/2020 05:54        Scheduled Meds:  dextrose       sodium chloride   Intravenous Once   acetaminophen  650 mg Oral Once   Chlorhexidine Gluconate Cloth  6 each Topical Daily   collagenase   Topical Daily   insulin aspart  0-6 Units Subcutaneous TID WC   pantoprazole (PROTONIX) IV  40 mg Intravenous Q24H   Continuous Infusions:  amiodarone 30 mg/hr (12/28/20 1012)   cefTRIAXone (ROCEPHIN)  IV 2 g (12/27/20 1741)     LOS: 2 days   Time spent: 44mins, case discussed with Joann Gutierrez Greater than 50% of this time was spent in counseling, explanation of diagnosis, planning of further management, and coordination of care.   Voice Recognition Viviann Spare dictation system was used to create this note, attempts have been made to correct errors. Please contact the author with questions and/or clarifications.   Florencia Reasons, MD PhD FACP [facility]  Available via Epic secure chat 7am-7pm for nonurgent issues Please page for urgent issues To page the attending provider between 7A-7P or the covering provider during after hours 7P-7A, please log into the web site [email or web address] and access using universal [place] password for that web site. If you do not have the password, please call the hospital operator.    12/28/2020, 5:16 PM

## 2020-12-29 DIAGNOSIS — Z515 Encounter for palliative care: Secondary | ICD-10-CM

## 2020-12-29 MED ORDER — FENTANYL CITRATE PF 50 MCG/ML IJ SOSY: 12.5000 ug | PREFILLED_SYRINGE | INTRAMUSCULAR | Status: DC | PRN

## 2020-12-29 MED ORDER — FENTANYL CITRATE PF 50 MCG/ML IJ SOSY
25.0000 ug | PREFILLED_SYRINGE | Freq: Three times a day (TID) | INTRAMUSCULAR | Status: DC
  Administered 2020-12-29 – 2020-12-31 (×4): 25 ug via INTRAVENOUS
  Filled 2020-12-29 (×4): qty 1

## 2020-12-29 NOTE — Progress Notes (Signed)
Nutrition Brief Note  Chart reviewed. Patient screened for Malnutrition Screening Tool (MST).  Pt now transitioning to comfort care.   No further nutrition interventions planned at this time.   Please consult if/as needed.   Derrel Nip, RD, LDN (she/her/hers) Clinical Inpatient Dietitian RD Pager/After-Hours/Weekend Pager # in [place]

## 2020-12-29 NOTE — Progress Notes (Signed)
PROGRESS NOTE    Joann Gutierrez  XQJ:[id number] DOB: 13-Nov-1945 DOA: 12/22/2020 PCP: Andree Moro, DO    Chief Complaint  Patient presents with   Wound Infection    Brief Narrative:  Joann Gutierrez is a 75 y.o. female with medical history significant of ESRD on HD TTS, chronic sacral pressure ulcer, PE on Eliquis, COPD, OSA, chronic ambulation dysfunction wheelchair-bound, was discharged home on hospice recently , appear family has reversed the decision , she is sent back to the hospital admitted for worsening of sacral pressure ulcer and fever.    Subjective:  On full comfort care, open eyes to voice briefly  Patient is with advanced Dementia , not communicating, not following commands, only grimacing to pain at times   Assessment & Plan:   Principal Problem:   Sepsis ([place]) Active Problems:   Severe sepsis ([place])   Sepsis present on admission from stage IV sacral decubitus ulcer/bacteremia -Was on broad-spectrum antibiotics, antibiotic adjusted according to [facility] ID -case discussed with critical care/nephrology patient is made DNR, no escalation of care due to futility of care in the setting of advanced dementia -She is a ward of state, nephrology do not recommend further dialysis, case discussed with DSS who give permission to transition care to full comfort measures and proceed with hospice care  A. fib RVR New diagnosis Start amiodarone drip due to hypotension She is a ward of state, nephrology do not recommend further dialysis, case discussed with DSS who give permission to transition care to full comfort measures and proceed with hospice care  Acute on chronic anemia -Received 4 units PRBC transfusion, hemoglobin has improved posttransfusion Does not appear to have external bleed -Anticoagulation held -She is a ward of state, nephrology do not recommend further dialysis, case discussed with DSS who give permission to transition care to full comfort measures and  proceed with hospice care  Recent diagnosis of PE Anticoagulation held due to acute on chronic anemia  End-stage renal disease Not a candidate for HD due to hypotension Nephrology consulted Critical care consulted Per outpatient dialysis unit, patient now is a ward of state DSS states that at this moment [name] is the person that needs to be contacted for assistance with guardianship. Her numbers are [phone] and cell [phone]  Patient is made DNR, no escalation of care, appreciate  critical care /nephrology input - nephrology do not recommend further dialysis, case discussed with DSS  [name] on 12/8 who give permission to transition care to full comfort measures and proceed with hospice care  FTT: Per family patient has mostly bed to wheelchair bound since January 2022, has significant weight loss of 40-60 pounds The patient's BMI is: Body mass index is 27.46 kg/m.. I have talked to the sister over the phone on 12/7 am  (who reportedly is healthcare power of attorney initially) , I explained to sister that CPR or intubation would not provide benefit to this patient with advanced dementia, progressive weight loss,  bed to wheelchair bound since January 2022 , she expressed understanding Palliative consulted as well, appreciate input     She is a ward of state, nephrology do not recommend further dialysis, case discussed with DSS [name] on 12/8 who give permission to transition care to full comfort measures and proceed with hospice care Per  [name] from [place], family can contact her if any questions regarding the plan.  .     Skin Assessment:  I have examined the patient's skin and I agree  with the wound assessment as performed by the wound care RN as outlined below:  Pressure Injury 11/10/20 Buttocks Left Stage 2 -  Partial thickness loss of dermis presenting as a shallow open injury with a red, pink wound bed without slough. (Active)  11/10/20 1921  Location:  Buttocks  Location Orientation: Left  Staging: Stage 2 -  Partial thickness loss of dermis presenting as a shallow open injury with a red, pink wound bed without slough.  Wound Description (Comments):   Present on Admission: Yes     Pressure Injury 11/14/20 Buttocks Right Stage 2 -  Partial thickness loss of dermis presenting as a shallow open injury with a red, pink wound bed without slough. (Active)  11/14/20 1400  Location: Buttocks  Location Orientation: Right  Staging: Stage 2 -  Partial thickness loss of dermis presenting as a shallow open injury with a red, pink wound bed without slough.  Wound Description (Comments):   Present on Admission: No        DVT prophylaxis: was on heparin drip, held due to acute anemia   Code Status: DNR   Family Communication: DSS [name] over the phone on 12/8 Disposition:   Status is: Inpatient  Dispo: The patient is from: home              Anticipated d/c is to: in hospital death vs hospice facility placement              Anticipated d/c date is: TBD                Consultants:  Critical care Nephrology  Procedures:  none  Antimicrobials:    Anti-infectives (From admission, onward)    Start     Dose/Rate Route Frequency Ordered Stop   12/27/20 1800  ceFEPIme (MAXIPIME) 1 g in sodium chloride 0.9 % 100 mL IVPB  Status:  Discontinued        1 g 200 mL/hr over 30 Minutes Intravenous Every 24 hours 01/07/2021 2208 12/27/20 0846   12/27/20 1800  cefTRIAXone (ROCEPHIN) 2 g in sodium chloride 0.9 % 100 mL IVPB  Status:  Discontinued        2 g 200 mL/hr over 30 Minutes Intravenous Every 24 hours 12/27/20 0846 12/28/20 1731   12/25/2020 2000  metroNIDAZOLE (FLAGYL) IVPB 500 mg  Status:  Discontinued        500 mg 100 mL/hr over 60 Minutes Intravenous Every 12 hours 12/22/2020 1904 12/27/20 0846   01/01/2021 1530  vancomycin (VANCOREADY) IVPB 1500 mg/300 mL        1,500 mg 150 mL/hr over 120 Minutes Intravenous  Once 12/21/2020 1456  12/30/2020 1804   12/23/2020 1515  ceFEPIme (MAXIPIME) 2 g in sodium chloride 0.9 % 100 mL IVPB        2 g 200 mL/hr over 30 Minutes Intravenous  Once 01/02/2021 1456 01/01/2021 1548          Objective: Vitals:   12/28/20 1433 12/28/20 1553 12/28/20 2116 12/29/20 0600  BP: (!) 90/46 (!) 89/46 (!) 69/36 (!) 79/41  Pulse:  (!) 120 (!) 126 (!) 120  Resp: 20 17 19 20   Temp:    98.2 F (36.8 C)  TempSrc:   Oral Oral  SpO2:  95%    Weight:      Height:        Intake/Output Summary (Last 24 hours) at 12/29/2020 1354 Last data filed at 12/29/2020 1337 Gross per 24 hour  Intake 0 ml  Output --  Net 0 ml   Filed Weights   01/02/2021 1515 12/27/20 1645 12/28/20 0402  Weight: 73 kg 72.6 kg 72.6 kg    Examination:  General exam:  open eyes at times, does not track with eyes, does not follow command ,moaning/grimacing  at times Respiratory system:   Respiratory effort normal.     Data Reviewed: I have personally reviewed following labs and imaging studies  CBC: Recent Labs  Lab 12/28/2020 1503 12/27/20 0349 12/27/20 0433 12/27/20 0821 12/27/20 1624 12/28/20 0319  WBC 50.6* 31.0*  --  31.4* 24.5* 23.5*  NEUTROABS 39.5*  --   --   --   --   --   HGB 8.6* 5.1* 5.5* 6.3* 9.6* 9.5*  HCT 26.6* 16.6* 16.3* 18.2* 27.6* 26.3*  MCV 79.2* 84.3  --  76.5* 79.5* 77.1*  PLT 170 PLATELET CLUMPS NOTED ON SMEAR, UNABLE TO ESTIMATE  --  104* 86* 76*    Basic Metabolic Panel: Recent Labs  Lab 01/19/2021 1503 12/27/20 0300  NA 141 143  K 4.6 4.1  CL 101 109  CO2 17* 15*  GLUCOSE 333* 191*  BUN 62* 62*  CREATININE 9.70* 8.87*  CALCIUM 9.1 8.4*    GFR: Estimated Creatinine Clearance: 5.4 mL/min (A) (by C-G formula based on SCr of 8.87 mg/dL (H)).  Liver Function Tests: Recent Labs  Lab 01/18/2021 1503  AST 19  ALT 14  ALKPHOS 177*  BILITOT 1.1  PROT 6.8  ALBUMIN 1.7*    CBG: Recent Labs  Lab 12/27/20 1852 12/27/20 2102 12/28/20 0613 12/28/20 1133 12/28/20 1552  GLUCAP  228* 181* 185* 157* 77     Recent Results (from the past 240 hour(s))  Resp Panel by RT-PCR (Flu A&B, Covid) Nasopharyngeal Swab     Status: None   Collection Time: 01/10/2021  2:48 PM   Specimen: Nasopharyngeal Swab; Nasopharyngeal(NP) swabs in vial transport medium  Result Value Ref Range Status   SARS Coronavirus 2 by RT PCR NEGATIVE NEGATIVE Final    Comment: (NOTE) SARS-CoV-2 target nucleic acids are NOT DETECTED.  The SARS-CoV-2 RNA is generally detectable in upper respiratory specimens during the acute phase of infection. The lowest concentration of SARS-CoV-2 viral copies this assay can detect is 138 copies/mL. A negative result does not preclude SARS-Cov-2 infection and should not be used as the sole basis for treatment or other patient management decisions. A negative result may occur with  improper specimen collection/handling, submission of specimen other than nasopharyngeal swab, presence of viral mutation(s) within the areas targeted by this assay, and inadequate number of viral copies(<138 copies/mL). A negative result must be combined with clinical observations, patient history, and epidemiological information. The expected result is Negative.  Fact Sheet for Patients:  EntrepreneurPulse.com.au  Fact Sheet for Healthcare Providers:  IncredibleEmployment.be  This test is no t yet approved or cleared by the [place] FDA and  has been authorized for detection and/or diagnosis of SARS-CoV-2 by FDA under an Emergency Use Authorization (EUA). This EUA will remain  in effect (meaning this test can be used) for the duration of the COVID-19 declaration under Section 564(b)(1) of the Act, 21 U.S.C.section 360bbb-3(b)(1), unless the authorization is terminated  or revoked sooner.       Influenza A by PCR NEGATIVE NEGATIVE Final   Influenza B by PCR NEGATIVE NEGATIVE Final    Comment: (NOTE) The Xpert Xpress SARS-CoV-2/FLU/RSV  plus assay is intended as an aid in the diagnosis of influenza from Nasopharyngeal swab  specimens and should not be used as a sole basis for treatment. Nasal washings and aspirates are unacceptable for Xpert Xpress SARS-CoV-2/FLU/RSV testing.  Fact Sheet for Patients: EntrepreneurPulse.com.au  Fact Sheet for Healthcare Providers: IncredibleEmployment.be  This test is not yet approved or cleared by the [place] FDA and has been authorized for detection and/or diagnosis of SARS-CoV-2 by FDA under an Emergency Use Authorization (EUA). This EUA will remain in effect (meaning this test can be used) for the duration of the COVID-19 declaration under Section 564(b)(1) of the Act, 21 U.S.C. section 360bbb-3(b)(1), unless the authorization is terminated or revoked.  Performed at [facility], [place] [address], [place], [place] [address]   Blood Culture (routine x 2)     Status: Abnormal (Preliminary result)   Collection Time: 01/09/2021  3:03 PM   Specimen: BLOOD  Result Value Ref Range Status   Specimen Description BLOOD LEFT ANTECUBITAL  Final   Special Requests   Final    BOTTLES DRAWN AEROBIC AND ANAEROBIC Blood Culture adequate volume   Culture  Setup Time   Final    GRAM NEGATIVE RODS IN BOTH AEROBIC AND ANAEROBIC BOTTLES CRITICAL RESULT CALLED TO, READ BACK BY AND VERIFIED WITH: PHARMD E.MARTIN AT 0272 ON 12/27/2020 BY T.SAAD.    Culture (A)  Final    MORGANELLA MORGANII PROTEUS MIRABILIS SUSCEPTIBILITIES TO FOLLOW Performed at [facility], [place] [address] [place],  [address]    Report Status PENDING  Incomplete  Blood Culture ID Panel (Reflexed)     Status: Abnormal   Collection Time: 01/18/2021  3:03 PM  Result Value Ref Range Status   Enterococcus faecalis NOT DETECTED NOT DETECTED Final   Enterococcus Faecium NOT DETECTED NOT DETECTED Final   Listeria monocytogenes NOT DETECTED NOT DETECTED Final   Staphylococcus  species NOT DETECTED NOT DETECTED Final   Staphylococcus aureus (BCID) NOT DETECTED NOT DETECTED Final   Staphylococcus epidermidis NOT DETECTED NOT DETECTED Final   Staphylococcus lugdunensis NOT DETECTED NOT DETECTED Final   Streptococcus species NOT DETECTED NOT DETECTED Final   Streptococcus agalactiae NOT DETECTED NOT DETECTED Final   Streptococcus pneumoniae NOT DETECTED NOT DETECTED Final   Streptococcus pyogenes NOT DETECTED NOT DETECTED Final   A.calcoaceticus-baumannii NOT DETECTED NOT DETECTED Final   Bacteroides fragilis NOT DETECTED NOT DETECTED Final   Enterobacterales DETECTED (A) NOT DETECTED Final    Comment: Enterobacterales represent a large order of gram negative bacteria, not a single organism. CRITICAL RESULT CALLED TO, READ BACK BY AND VERIFIED WITH: PHARMD E.MARTIN AT 4034 ON 12/27/2020 BY T.SAAD.    Enterobacter cloacae complex NOT DETECTED NOT DETECTED Final   Escherichia coli NOT DETECTED NOT DETECTED Final   Klebsiella aerogenes NOT DETECTED NOT DETECTED Final   Klebsiella oxytoca NOT DETECTED NOT DETECTED Final   Klebsiella pneumoniae NOT DETECTED NOT DETECTED Final   Proteus species DETECTED (A) NOT DETECTED Final    Comment: CRITICAL RESULT CALLED TO, READ BACK BY AND VERIFIED WITH: PHARMD E.MARTIN AT 7425 ON 12/27/2020 BY T.SAAD.    Salmonella species NOT DETECTED NOT DETECTED Final   Serratia marcescens NOT DETECTED NOT DETECTED Final   Haemophilus influenzae NOT DETECTED NOT DETECTED Final   Neisseria meningitidis NOT DETECTED NOT DETECTED Final   Pseudomonas aeruginosa NOT DETECTED NOT DETECTED Final   Stenotrophomonas maltophilia NOT DETECTED NOT DETECTED Final   Candida albicans NOT DETECTED NOT DETECTED Final   Candida auris NOT DETECTED NOT DETECTED Final   Candida glabrata NOT DETECTED  NOT DETECTED Final   Candida krusei NOT DETECTED NOT DETECTED Final   Candida parapsilosis NOT DETECTED NOT DETECTED Final   Candida tropicalis NOT DETECTED  NOT DETECTED Final   Cryptococcus neoformans/gattii NOT DETECTED NOT DETECTED Final   CTX-M ESBL NOT DETECTED NOT DETECTED Final   Carbapenem resistance IMP NOT DETECTED NOT DETECTED Final   Carbapenem resistance KPC NOT DETECTED NOT DETECTED Final   Carbapenem resistance NDM NOT DETECTED NOT DETECTED Final   Carbapenem resist OXA 48 LIKE NOT DETECTED NOT DETECTED Final   Carbapenem resistance VIM NOT DETECTED NOT DETECTED Final    Comment: Performed at [facility], [place] [address] [place], [place] [address]         Radiology Studies: No results found.      Scheduled Meds:  sodium chloride   Intravenous Once   acetaminophen  650 mg Oral Once   collagenase   Topical Daily   Continuous Infusions:     LOS: 3 days   Time spent: 9mins, case discussed with palliative care   Voice Recognition /Dragon dictation system was used to create this note, attempts have been made to correct errors. Please contact the author with questions and/or clarifications.   Florencia Reasons, MD PhD FACP [facility]  Available via Epic secure chat 7am-7pm for nonurgent issues Please page for urgent issues To page the attending provider between 7A-7P or the covering provider during after hours 7P-7A, please log into the web site [email or web address] and access using universal [place] password for that web site. If you do not have the password, please call the hospital operator.    12/29/2020, 1:54 PM

## 2020-12-29 NOTE — Progress Notes (Addendum)
Palliative Medicine Inpatient Follow Up Note  Consulting Provider: Lequita Halt, MD   Reason for consult:   [place] Palliative Medicine Consult  Reason for Consult? Severe sepsis and pressure ulcers.    HPI:  Per intake H&P --> Joann Gutierrez is a 75 y.o. female with medical history significant of ESRD on HD TTS, chronic sacral pressure ulcer, PE on Eliquis, COPD, OSA, chronic ambulation dysfunction wheelchair-bound, was brought in by family for worsening of sacral pressure ulcer and fever. Found to be in severe sepsis with profound anemia.    Patient recently admitted 11/09/2020-11/23/2020 for hypoxia secondary to acute left upper lobe PE.  She was initially on IV heparin and transition to oral Eliquis however patient began to refuse oral medications.  Per discharge summary, extensive goals of care discussions with palliative care, nephrology, primary team, and family resulted in transition to DNR/DNI status and discharged to home with hospice care. Reversed by family as they noted improvement and wished to continue dialysis.    Readmitted 11/24 due to hypoxia again r/t PE. Met with PMT and improved after a few days of admission. Family adamant at that time to continue with full scope of care.   Has been followed by [facility] as an outpatient  Patient recently appointed a legal decision maker, Joann Gutierrez Per documents it appears this occurred on December 2 of this year.  This has been the person who has been appointed to make healthcare decisions enabling the healthcare team to transition Joann Gutierrez to a DO NOT RESUSCITATE order and to provide comfort measures.  Today's Discussion (12/29/2020):  *Please note that this is a verbal dictation therefore any spelling or grammatical errors are due to the "[place]" system interpretation.  Chart reviewed.   I met with Joann Gutierrez at bedside. She was noted to have intermittent grimacing.  She did not respond  to me though did open her eyes.   There was no family present at bedside.  Called patients Guardian, Joann Gutierrez to provide an update she is aware of the plan for symptom management and asks for an update when patient passes.  Objective Assessment: Vital Signs Vitals:   12/29/20 0600 12/29/20 1507  BP: (!) 79/41 (!) 78/32  Pulse: (!) 120 (!) 128  Resp: 20 19  Temp: 98.2 F (36.8 C) (!) 97.4 F (36.3 C)  SpO2:  95%    Intake/Output Summary (Last 24 hours) at 12/29/2020 1633 Last data filed at 12/29/2020 1337 Gross per 24 hour  Intake 0 ml  Output --  Net 0 ml   Last Weight  Most recent update: 12/28/2020  4:04 AM    Weight  72.6 kg (160 lb)            Gen:  Chronically ill appearing AA F   HEENT: Dry mucous membranes CV: Irregular rate and rhythm PULM: On RA ABD: diffuse tenderness EXT: Generalized anasarca Neuro: Somnolent though opens eyes  SUMMARY OF RECOMMENDATIONS   DNAR/DNI  Comfort focused care - Change morphine to fentanyl given ESRD added low dose 31mg Q8H ATC for generalized discomfort noted through grimacing  Unrestricted visitation   Anticipate in Hospital death  Time Spent: 25 Greater than 50% of the time was spent in counseling and coordination of care ______________________________________________________________________________________ [facility] Team Cell Phone: [phone] utilize secure chat with additional questions, if there is no response within 30 minutes please call the above phone number  Palliative Medicine  Team providers are available by phone from 7am to 7pm daily and can be reached through the team cell phone.  Should this patient require assistance outside of these hours, please call the patient's attending physician.

## 2020-12-29 NOTE — TOC Progression Note (Addendum)
Transition of Care [facility]) - Progression Note    Patient Details  Name: Joann Gutierrez MRN: [id number] Date of Birth: 31-Jul-1945  Transition of [facility] CM/SW Contact  Zenon Mayo, RN Phone Number: 12/29/2020, 6:45 PM  Clinical Narrative:    Patient is for comfort care, she has legal guardian , [name] [phone]  appointed on 12/22/20, per palliative note expect hospital expiration.        Expected Discharge Plan and Services                                                 Social Determinants of Health (SDOH) Interventions    Readmission Risk Interventions No flowsheet data found.

## 2020-12-30 LAB — CULTURE, BLOOD (ROUTINE X 2): Special Requests: ADEQUATE

## 2020-12-30 NOTE — Progress Notes (Signed)
PROGRESS NOTE    Joann Gutierrez  DJS:[id number] DOB: December 08, 1945 DOA: 01/08/2021 PCP: Andree Moro, DO    Chief Complaint  Patient presents with   Wound Infection    Brief Narrative:  Joann Gutierrez is a 75 y.o. female with medical history significant of ESRD on HD TTS, chronic sacral pressure ulcer, PE on Eliquis, COPD, OSA, chronic ambulation dysfunction wheelchair-bound, was discharged home on hospice recently , appear family has reversed the decision , she is sent back to the hospital admitted for worsening of sacral pressure ulcer and fever.    Subjective:  On full comfort care, she is not awake, appears comfortable   Assessment & Plan:   Principal Problem:   Sepsis ([place]) Active Problems:   Severe sepsis ([place])   Sepsis present on admission from stage IV sacral decubitus ulcer/bacteremia -Was on broad-spectrum antibiotics, antibiotic adjusted according to [facility] ID -case discussed with critical care/nephrology patient is made DNR, no escalation of care due to futility of care in the setting of advanced dementia -She is a ward of state, nephrology do not recommend further dialysis, case discussed with DSS who give permission to transition care to full comfort measures and proceed with hospice care  A. fib RVR New diagnosis Start amiodarone drip due to hypotension She is a ward of state, nephrology do not recommend further dialysis, case discussed with DSS who give permission to transition care to full comfort measures and proceed with hospice care  Acute on chronic anemia -Received 4 units PRBC transfusion, hemoglobin has improved posttransfusion Does not appear to have external bleed -Anticoagulation held -She is a ward of state, nephrology do not recommend further dialysis, case discussed with DSS who give permission to transition care to full comfort measures and proceed with hospice care  Recent diagnosis of PE Anticoagulation held due to acute on chronic  anemia  End-stage renal disease Not a candidate for HD due to hypotension Nephrology consulted Critical care consulted Per outpatient dialysis unit, patient now is a ward of state DSS states that at this moment [name] is the person that needs to be contacted for assistance with guardianship. Her numbers are [phone] and cell [phone]  Patient is made DNR, no escalation of care, appreciate  critical care /nephrology input - nephrology do not recommend further dialysis, case discussed with DSS  [name] on 12/8 who give permission to transition care to full comfort measures and proceed with hospice care  FTT: Per family patient has mostly bed to wheelchair bound since January 2022, has significant weight loss of 40-60 pounds The patient's BMI is: Body mass index is 27.46 kg/m.. I have talked to the sister over the phone on 12/7 am  (who reportedly is healthcare power of attorney initially) , I explained to sister that CPR or intubation would not provide benefit to this patient with advanced dementia, progressive weight loss,  bed to wheelchair bound since January 2022 , she expressed understanding Palliative consulted as well, appreciate input     She is a ward of state, nephrology do not recommend further dialysis, case discussed with DSS [name] on 12/8 who give permission to transition care to full comfort measures and proceed with hospice care Per  [name] from [place], family can contact her if any questions regarding the plan.  .     Skin Assessment:  I have examined the patient's skin and I agree with the wound assessment as performed by the wound care RN as outlined below:  Pressure Injury  11/10/20 Buttocks Left Stage 2 -  Partial thickness loss of dermis presenting as a shallow open injury with a red, pink wound bed without slough. (Active)  11/10/20 1921  Location: Buttocks  Location Orientation: Left  Staging: Stage 2 -  Partial thickness loss of dermis  presenting as a shallow open injury with a red, pink wound bed without slough.  Wound Description (Comments):   Present on Admission: Yes     Pressure Injury 11/14/20 Buttocks Right Stage 2 -  Partial thickness loss of dermis presenting as a shallow open injury with a red, pink wound bed without slough. (Active)  11/14/20 1400  Location: Buttocks  Location Orientation: Right  Staging: Stage 2 -  Partial thickness loss of dermis presenting as a shallow open injury with a red, pink wound bed without slough.  Wound Description (Comments):   Present on Admission: No        DVT prophylaxis: was on heparin drip, held due to acute anemia   Code Status: DNR   Family Communication: DSS [name] over the phone on 12/8 Disposition:   Status is: Inpatient  Dispo: The patient is from: home              Anticipated d/c is to: in hospital death vs hospice facility placement              Anticipated d/c date is: TBD                Consultants:  Critical care Nephrology  Procedures:  none  Antimicrobials:    Anti-infectives (From admission, onward)    Start     Dose/Rate Route Frequency Ordered Stop   12/27/20 1800  ceFEPIme (MAXIPIME) 1 g in sodium chloride 0.9 % 100 mL IVPB  Status:  Discontinued        1 g 200 mL/hr over 30 Minutes Intravenous Every 24 hours 01/16/2021 2208 12/27/20 0846   12/27/20 1800  cefTRIAXone (ROCEPHIN) 2 g in sodium chloride 0.9 % 100 mL IVPB  Status:  Discontinued        2 g 200 mL/hr over 30 Minutes Intravenous Every 24 hours 12/27/20 0846 12/28/20 1731   01/03/2021 2000  metroNIDAZOLE (FLAGYL) IVPB 500 mg  Status:  Discontinued        500 mg 100 mL/hr over 60 Minutes Intravenous Every 12 hours 01/19/2021 1904 12/27/20 0846   01/14/2021 1530  vancomycin (VANCOREADY) IVPB 1500 mg/300 mL        1,500 mg 150 mL/hr over 120 Minutes Intravenous  Once 01/18/2021 1456 12/30/2020 1804   01/03/2021 1515  ceFEPIme (MAXIPIME) 2 g in sodium chloride 0.9 % 100 mL IVPB         2 g 200 mL/hr over 30 Minutes Intravenous  Once 01/01/2021 1456 12/25/2020 1548          Objective: Vitals:   12/29/20 0600 12/29/20 1507 12/29/20 2008 12/30/20 1400  BP: (!) 79/41 (!) 78/32 (!) 89/21 (!) 117/34  Pulse: (!) 120 (!) 128  (!) 109  Resp: 20 19  20   Temp: 98.2 F (36.8 C) (!) 97.4 F (36.3 C)    TempSrc: Oral Oral    SpO2:  95%  92%  Weight:      Height:       No intake or output data in the 24 hours ending 12/30/20 1502  Filed Weights   12/27/2020 1515 12/27/20 1645 12/28/20 0402  Weight: 73 kg 72.6 kg 72.6 kg    Examination:  General exam: Not awake, appears comfortable Respiratory system:   Respiratory effort normal.     Data Reviewed: I have personally reviewed following labs and imaging studies  CBC: Recent Labs  Lab 12/22/2020 1503 12/27/20 0349 12/27/20 0433 12/27/20 0821 12/27/20 1624 12/28/20 0319  WBC 50.6* 31.0*  --  31.4* 24.5* 23.5*  NEUTROABS 39.5*  --   --   --   --   --   HGB 8.6* 5.1* 5.5* 6.3* 9.6* 9.5*  HCT 26.6* 16.6* 16.3* 18.2* 27.6* 26.3*  MCV 79.2* 84.3  --  76.5* 79.5* 77.1*  PLT 170 PLATELET CLUMPS NOTED ON SMEAR, UNABLE TO ESTIMATE  --  104* 86* 76*    Basic Metabolic Panel: Recent Labs  Lab 01/16/2021 1503 12/27/20 0300  NA 141 143  K 4.6 4.1  CL 101 109  CO2 17* 15*  GLUCOSE 333* 191*  BUN 62* 62*  CREATININE 9.70* 8.87*  CALCIUM 9.1 8.4*    GFR: Estimated Creatinine Clearance: 5.4 mL/min (A) (by C-G formula based on SCr of 8.87 mg/dL (H)).  Liver Function Tests: Recent Labs  Lab 01/01/2021 1503  AST 19  ALT 14  ALKPHOS 177*  BILITOT 1.1  PROT 6.8  ALBUMIN 1.7*    CBG: Recent Labs  Lab 12/27/20 1852 12/27/20 2102 12/28/20 0613 12/28/20 1133 12/28/20 1552  GLUCAP 228* 181* 185* 157* 77     Recent Results (from the past 240 hour(s))  Resp Panel by RT-PCR (Flu A&B, Covid) Nasopharyngeal Swab     Status: None   Collection Time: 01/11/2021  2:48 PM   Specimen: Nasopharyngeal Swab;  Nasopharyngeal(NP) swabs in vial transport medium  Result Value Ref Range Status   SARS Coronavirus 2 by RT PCR NEGATIVE NEGATIVE Final    Comment: (NOTE) SARS-CoV-2 target nucleic acids are NOT DETECTED.  The SARS-CoV-2 RNA is generally detectable in upper respiratory specimens during the acute phase of infection. The lowest concentration of SARS-CoV-2 viral copies this assay can detect is 138 copies/mL. A negative result does not preclude SARS-Cov-2 infection and should not be used as the sole basis for treatment or other patient management decisions. A negative result may occur with  improper specimen collection/handling, submission of specimen other than nasopharyngeal swab, presence of viral mutation(s) within the areas targeted by this assay, and inadequate number of viral copies(<138 copies/mL). A negative result must be combined with clinical observations, patient history, and epidemiological information. The expected result is Negative.  Fact Sheet for Patients:  EntrepreneurPulse.com.au  Fact Sheet for Healthcare Providers:  IncredibleEmployment.be  This test is no t yet approved or cleared by the [place] FDA and  has been authorized for detection and/or diagnosis of SARS-CoV-2 by FDA under an Emergency Use Authorization (EUA). This EUA will remain  in effect (meaning this test can be used) for the duration of the COVID-19 declaration under Section 564(b)(1) of the Act, 21 U.S.C.section 360bbb-3(b)(1), unless the authorization is terminated  or revoked sooner.       Influenza A by PCR NEGATIVE NEGATIVE Final   Influenza B by PCR NEGATIVE NEGATIVE Final    Comment: (NOTE) The Xpert Xpress SARS-CoV-2/FLU/RSV plus assay is intended as an aid in the diagnosis of influenza from Nasopharyngeal swab specimens and should not be used as a sole basis for treatment. Nasal washings and aspirates are unacceptable for Xpert Xpress  SARS-CoV-2/FLU/RSV testing.  Fact Sheet for Patients: EntrepreneurPulse.com.au  Fact Sheet for Healthcare Providers: IncredibleEmployment.be  This test is not yet approved or cleared  by the [place] and has been authorized for detection and/or diagnosis of SARS-CoV-2 by FDA under an Emergency Use Authorization (EUA). This EUA will remain in effect (meaning this test can be used) for the duration of the COVID-19 declaration under Section 564(b)(1) of the Act, 21 U.S.C. section 360bbb-3(b)(1), unless the authorization is terminated or revoked.  Performed at [facility], [place] [address], [place], [place] [address]   Blood Culture (routine x 2)     Status: Abnormal   Collection Time: 12/30/2020  3:03 PM   Specimen: BLOOD  Result Value Ref Range Status   Specimen Description BLOOD LEFT ANTECUBITAL  Final   Special Requests   Final    BOTTLES DRAWN AEROBIC AND ANAEROBIC Blood Culture adequate volume   Culture  Setup Time   Final    GRAM NEGATIVE RODS IN BOTH AEROBIC AND ANAEROBIC BOTTLES CRITICAL RESULT CALLED TO, READ BACK BY AND VERIFIED WITH: PHARMD E.MARTIN AT 1696 ON 12/27/2020 BY T.SAAD. Performed at [facility], [place] [address], [place], [place] [address]    Culture [facility] MORGANII PROTEUS MIRABILIS  (A)  Final   Report Status 12/30/2020 FINAL  Final   Organism ID, Bacteria MORGANELLA MORGANII  Final   Organism ID, Bacteria PROTEUS MIRABILIS  Final      Susceptibility   Morganella morganii - MIC*    AMPICILLIN >=32 RESISTANT Resistant     CEFAZOLIN >=64 RESISTANT Resistant     CEFTAZIDIME 32 RESISTANT Resistant     CIPROFLOXACIN <=0.25 SENSITIVE Sensitive     GENTAMICIN <=1 SENSITIVE Sensitive     IMIPENEM 2 SENSITIVE Sensitive     TRIMETH/SULFA <=20 SENSITIVE Sensitive     AMPICILLIN/SULBACTAM >=32 RESISTANT Resistant     PIP/TAZO <=4 SENSITIVE Sensitive     * MORGANELLA MORGANII   Proteus mirabilis - MIC*     AMPICILLIN 4 SENSITIVE Sensitive     CEFAZOLIN 8 SENSITIVE Sensitive     CEFEPIME <=0.12 SENSITIVE Sensitive     CEFTAZIDIME <=1 SENSITIVE Sensitive     CEFTRIAXONE <=0.25 SENSITIVE Sensitive     CIPROFLOXACIN <=0.25 SENSITIVE Sensitive     GENTAMICIN <=1 SENSITIVE Sensitive     IMIPENEM >=16 RESISTANT Resistant     TRIMETH/SULFA <=20 SENSITIVE Sensitive     AMPICILLIN/SULBACTAM <=2 SENSITIVE Sensitive     PIP/TAZO <=4 SENSITIVE Sensitive     * PROTEUS MIRABILIS  Blood Culture ID Panel (Reflexed)     Status: Abnormal   Collection Time: 01/19/2021  3:03 PM  Result Value Ref Range Status   Enterococcus faecalis NOT DETECTED NOT DETECTED Final   Enterococcus Faecium NOT DETECTED NOT DETECTED Final   Listeria monocytogenes NOT DETECTED NOT DETECTED Final   Staphylococcus species NOT DETECTED NOT DETECTED Final   Staphylococcus aureus (BCID) NOT DETECTED NOT DETECTED Final   Staphylococcus epidermidis NOT DETECTED NOT DETECTED Final   Staphylococcus lugdunensis NOT DETECTED NOT DETECTED Final   Streptococcus species NOT DETECTED NOT DETECTED Final   Streptococcus agalactiae NOT DETECTED NOT DETECTED Final   Streptococcus pneumoniae NOT DETECTED NOT DETECTED Final   Streptococcus pyogenes NOT DETECTED NOT DETECTED Final   A.calcoaceticus-baumannii NOT DETECTED NOT DETECTED Final   Bacteroides fragilis NOT DETECTED NOT DETECTED Final   Enterobacterales DETECTED (A) NOT DETECTED Final    Comment: Enterobacterales represent a large order of gram negative bacteria, not a single organism. CRITICAL RESULT CALLED TO, READ BACK BY AND VERIFIED WITH: PHARMD E.MARTIN AT 1017 ON 12/27/2020 BY T.SAAD.  Enterobacter cloacae complex NOT DETECTED NOT DETECTED Final   Escherichia coli NOT DETECTED NOT DETECTED Final   Klebsiella aerogenes NOT DETECTED NOT DETECTED Final   Klebsiella oxytoca NOT DETECTED NOT DETECTED Final   Klebsiella pneumoniae NOT DETECTED NOT DETECTED Final   Proteus species  DETECTED (A) NOT DETECTED Final    Comment: CRITICAL RESULT CALLED TO, READ BACK BY AND VERIFIED WITH: PHARMD E.MARTIN AT 7858 ON 12/27/2020 BY T.SAAD.    Salmonella species NOT DETECTED NOT DETECTED Final   Serratia marcescens NOT DETECTED NOT DETECTED Final   Haemophilus influenzae NOT DETECTED NOT DETECTED Final   Neisseria meningitidis NOT DETECTED NOT DETECTED Final   Pseudomonas aeruginosa NOT DETECTED NOT DETECTED Final   Stenotrophomonas maltophilia NOT DETECTED NOT DETECTED Final   Candida albicans NOT DETECTED NOT DETECTED Final   Candida auris NOT DETECTED NOT DETECTED Final   Candida glabrata NOT DETECTED NOT DETECTED Final   Candida krusei NOT DETECTED NOT DETECTED Final   Candida parapsilosis NOT DETECTED NOT DETECTED Final   Candida tropicalis NOT DETECTED NOT DETECTED Final   Cryptococcus neoformans/gattii NOT DETECTED NOT DETECTED Final   CTX-M ESBL NOT DETECTED NOT DETECTED Final   Carbapenem resistance IMP NOT DETECTED NOT DETECTED Final   Carbapenem resistance KPC NOT DETECTED NOT DETECTED Final   Carbapenem resistance NDM NOT DETECTED NOT DETECTED Final   Carbapenem resist OXA 48 LIKE NOT DETECTED NOT DETECTED Final   Carbapenem resistance VIM NOT DETECTED NOT DETECTED Final    Comment: Performed at [facility], [address] [place], [place] [address]         Radiology Studies: No results found.      Scheduled Meds:  sodium chloride   Intravenous Once   acetaminophen  650 mg Oral Once   collagenase   Topical Daily   fentaNYL (SUBLIMAZE) injection  25 mcg Intravenous Q8H   Continuous Infusions:     LOS: 4 days   Time spent: 66mins,  Voice Recognition /Dragon dictation system was used to create this note, attempts have been made to correct errors. Please contact the author with questions and/or clarifications.   Florencia Reasons, MD PhD FACP [facility]  Available via Epic secure chat 7am-7pm for nonurgent issues Please page for  urgent issues To page the attending provider between 7A-7P or the covering provider during after hours 7P-7A, please log into the web site [email or web address] and access using universal [place] password for that web site. If you do not have the password, please call the hospital operator.    12/30/2020, 3:02 PM

## 2020-12-30 NOTE — Progress Notes (Addendum)
Palliative Medicine Inpatient Follow Up Note  Consulting Provider: Lequita Halt, MD   Reason for consult:   [place] Palliative Medicine Consult  Reason for Consult? Severe sepsis and pressure ulcers.    HPI:  Per intake H&P --> Joann Gutierrez is a 75 y.o. female with medical history significant of ESRD on HD TTS, chronic sacral pressure ulcer, PE on Eliquis, COPD, OSA, chronic ambulation dysfunction wheelchair-bound, was brought in by family for worsening of sacral pressure ulcer and fever. Found to be in severe sepsis with profound anemia.    Patient recently admitted 11/09/2020-11/23/2020 for hypoxia secondary to acute left upper lobe PE.  She was initially on IV heparin and transition to oral Eliquis however patient began to refuse oral medications.  Per discharge summary, extensive goals of care discussions with palliative care, nephrology, primary team, and family resulted in transition to DNR/DNI status and discharged to home with hospice care. Reversed by family as they noted improvement and wished to continue dialysis.    Readmitted 11/24 due to hypoxia again r/t PE. Met with PMT and improved after a few days of admission. Family adamant at that time to continue with full scope of care.   Has been followed by [facility] as an outpatient  Patient recently appointed a legal decision maker, [name] Per documents it appears this occurred on December 2 of this year.  This has been the person who has been appointed to make healthcare decisions enabling the healthcare team to transition [name] to a DO NOT RESUSCITATE order and to provide comfort measures.  Today's Discussion (12/30/2020):  *Please note that this is a verbal dictation therefore any spelling or grammatical errors are due to the "[place]" system interpretation.  Chart reviewed.   I met with Joann Gutierrez at bedside this morning. She does not appear to be in distress. Certainly  appear more comfortable than on the day prior. Plan to continue fentanyl as ordered.  I spoke to patients bedside RN, Stanton Kidney who shares that patient appears to be nearing end of life. We reviewed the goal of continuing symptom management to alleviate suffering.  Patients legal guardian provided with phone updates.  Objective Assessment: Vital Signs Vitals:   12/29/20 1507 12/29/20 2008  BP: (!) 78/32 (!) 89/21  Pulse: (!) 128   Resp: 19   Temp: (!) 97.4 F (36.3 C)   SpO2: 95%     Intake/Output Summary (Last 24 hours) at 12/30/2020 8676 Last data filed at 12/29/2020 1337 Gross per 24 hour  Intake 0 ml  Output --  Net 0 ml    Last Weight  Most recent update: 12/28/2020  4:04 AM    Weight  72.6 kg (160 lb)            Gen:  Chronically ill appearing AA F   HEENT: Dry mucous membranes CV: Irregular rate and rhythm PULM: On RA ABD: Soft EXT: Generalized anasarca Neuro: Somnolent though opens eyes  SUMMARY OF RECOMMENDATIONS   DNAR/DNI  Comfort focused care  Fentanyl low dose 44mcg Q8H ATC for generalized discomfort and Q1H PRN  Unrestricted visitation   Anticipate in Hospital death  Time Spent: 25 Greater than 50% of the time was spent in counseling and coordination of care ______________________________________________________________________________________ [place] Team Team Cell Phone: [phone] Please utilize secure chat with additional questions, if there is no response within 30 minutes please call the above phone number  Palliative Medicine Team providers  are available by phone from 7am to 7pm daily and can be reached through the team cell phone.  Should this patient require assistance outside of these hours, please call the patient's attending physician.

## 2020-12-31 DIAGNOSIS — F0390 Unspecified dementia without behavioral disturbance: Secondary | ICD-10-CM

## 2021-01-21 NOTE — Progress Notes (Signed)
Patient expired. Notified IV team of right chest HD cath. Was told to leave HD cath in and patient can go to the morgue.

## 2021-01-21 NOTE — Progress Notes (Signed)
Ms Wahler expired at 72 on 2021-01-20. Dr Erlinda Hong notified along with the legal guardian, [name], and daughter, Irene Pap. [place] notified at [phone].

## 2021-01-21 NOTE — Plan of Care (Signed)
  Problem: Health Behavior/Discharge Planning: Goal: Ability to manage health-related needs will improve Outcome: Not Progressing   Problem: Clinical Measurements: Goal: Ability to maintain clinical measurements within normal limits will improve Outcome: Not Progressing   Problem: Clinical Measurements: Goal: Respiratory complications will improve Outcome: Not Progressing

## 2021-01-21 NOTE — Discharge Summary (Signed)
Discharge Summary  Joann Gutierrez PTW:[id number] DOB: 22-Feb-1945  PCP: Andree Moro, DO  Admit date: 01-15-2021 Time of death :April 25, 1545 01/20/21  Time spent: 33mins  Discharge Diagnoses:  Active Hospital Problems   Diagnosis Date Noted   Sepsis ([place]) 02/16/2020   Severe sepsis ([place]) 11/09/2020    Resolved Hospital Problems  No resolved problems to display.     Filed Weights   01-15-21 1515 12/27/20 1645 12/28/20 0402  Weight: 73 kg 72.6 kg 72.6 kg    History of present illness: ( per admitting MD Dr Roosevelt Locks) Chief Complaint: Patient confused   HPI: Joann Gutierrez is a 76 y.o. female with medical history significant of ESRD on HD TTS, chronic sacral pressure ulcer, PE on Eliquis, COPD, OSA, chronic ambulation dysfunction wheelchair-bound, was boarding by family for worsening of sacral pressure ulcer and fever.   Patient confused not able to provide any history, most history provided by patient niece over the phone and sister at bedside.  Patient was recently hospitalized for hypoxia and PE, but after discharge, patient very much remained bedbound and inactive because of weakness.  Today, niece went to see the patient and found patient's sacral wound progressed and patient had a fever 101.2.  Patient also missed Saturday's dialysis last week because of the weakness.  Patient CODE STATUS has been changed several times during last few months from full code to DNR then back to full code again last admission.     ED Course: Patient was found septic with fever 100.8, lactic acid 7.0> 6.4, total of 1000 mL IV bolus given, blood pressure remains low.  No hypoxia.   She was started on vancomycin cefepime and Flagyl.  Hospital Course:  Principal Problem:   Sepsis ([place]) Active Problems:   Severe sepsis ([name])  Sepsis present on admission from stage IV sacral decubitus ulcer/bacteremia -Was on broad-spectrum antibiotics, antibiotic adjusted according to [facility] ID -case discussed  with critical care/nephrology patient is made DNR, no escalation of care due to futility of care in the setting of advanced dementia -She is a ward of state, nephrology do not recommend further dialysis, case discussed with DSS who give permission to transition care to full comfort measures and proceed with hospice care -appreciate palliative care input, patient passed away peacefully on Jan 21, 2023.   A. fib RVR New diagnosis Start amiodarone drip due to hypotension She is a ward of state, nephrology do not recommend further dialysis, case discussed with DSS who give permission to transition care to full comfort measures and proceed with hospice care  appreciate palliative care input, patient passed away peacefully on January 21, 2023.  Acute on chronic anemia -Received 4 units PRBC transfusion, hemoglobin has improved posttransfusion Does not appear to have external bleed -Anticoagulation held -She is a ward of state, nephrology do not recommend further dialysis, case discussed with DSS who give permission to transition care to full comfort measures and proceed with hospice care  -appreciate palliative care input, patient passed away peacefully on 21-Jan-2023.  Recent diagnosis of PE Anticoagulation held due to acute on chronic anemia   End-stage renal disease Not a candidate for HD due to hypotension Nephrology consulted Critical care consulted Per outpatient dialysis unit, patient now is a ward of state DSS states that at this moment [name] is the person that needs to be contacted for assistance with guardianship. Her numbers are [phone] and cell [phone]  Patient is made DNR, no escalation of care, appreciate  critical care /nephrology input - nephrology  do not recommend further dialysis, case discussed with DSS  [name] on 12/8 who give permission to transition care to full comfort measures and proceed with hospice care appreciate palliative care input, patient passed away peacefully on  15-Jan-2023.   FTT: Per family patient has mostly bed to wheelchair bound since January 2022, has significant weight loss of 40-60 pounds The patient's BMI is: Body mass index is 27.46 kg/m.. I have talked to the sister over the phone on 12/7 am  (who reportedly is healthcare power of attorney initially) , I explained to sister that CPR or intubation would not provide benefit to this patient with advanced dementia, progressive weight loss,  bed to wheelchair bound since January 2022 , she expressed understanding   She is a ward of state, nephrology do not recommend further dialysis, case discussed with DSS [name] on 12/8 who give permission to transition care to full comfort measures and proceed with hospice care Per  [name] from [place], family can contact her if any questions regarding the plan.  appreciate palliative care input, patient passed away peacefully on 01/15/23. .       Skin Assessment:   I have examined the patient's skin and I agree with the wound assessment as performed by the wound care RN as outlined below:   Pressure Injury 11/10/20 Buttocks Left Stage 2 -  Partial thickness loss of dermis presenting as a shallow open injury with a red, pink wound bed without slough. (Active)  11/10/20 1921  Location: Buttocks  Location Orientation: Left  Staging: Stage 2 -  Partial thickness loss of dermis presenting as a shallow open injury with a red, pink wound bed without slough.  Wound Description (Comments):   Present on Admission: Yes     Pressure Injury 11/14/20 Buttocks Right Stage 2 -  Partial thickness loss of dermis presenting as a shallow open injury with a red, pink wound bed without slough. (Active)  11/14/20 1400  Location: Buttocks  Location Orientation: Right  Staging: Stage 2 -  Partial thickness loss of dermis presenting as a shallow open injury with a red, pink wound bed without slough.  Wound Description (Comments):   Present on Admission: No           Code Status:  DNR                  Consultants:  Critical care Nephrology Palliative care   Procedures:  none      Allergies as of 2021-01-14       Reactions   Morphine And Related Other (See Comments)   Family request not to be given, reports pt does not wake up when given    Gabapentin Other (See Comments)   Pt is hard to wake up when this is given per sister   Oxycodone Other (See Comments)   Pt is hard to wake up when this is given per sister   Promethazine Hcl Other (See Comments)   lethargy   Vancomycin Hives, Rash        Medication List     STOP taking these medications    acetaminophen 325 MG tablet Commonly known as: TYLENOL   acetaminophen 650 MG CR tablet Commonly known as: TYLENOL   apixaban 5 MG Tabs tablet Commonly known as: ELIQUIS   Apixaban Starter Pack (10mg  and 5mg ) Commonly known as: ELIQUIS STARTER PACK   atorvastatin 20 MG tablet Commonly known as: LIPITOR   haloperidol 0.5 MG tablet Commonly known as: HALDOL  Nepro Liqd   ondansetron 4 MG disintegrating tablet Commonly known as: ZOFRAN-ODT   oxyCODONE 20 MG/ML concentrated solution Commonly known as: ROXICODONE INTENSOL   polyvinyl alcohol 1.4 % ophthalmic solution Commonly known as: LIQUIFILM TEARS   senna 8.6 MG tablet Commonly known as: Senokot       Allergies  Allergen Reactions   Morphine And Related Other (See Comments)    Family request not to be given, reports pt does not wake up when given    Gabapentin Other (See Comments)    Pt is hard to wake up when this is given per sister    Oxycodone Other (See Comments)    Pt is hard to wake up when this is given per sister   Promethazine Hcl Other (See Comments)    lethargy   Vancomycin Hives and Rash      The results of significant diagnostics from this hospitalization (including imaging, microbiology, ancillary and laboratory) are listed below for reference.    Significant Diagnostic Studies: CT Angio Chest PE W  and/or Wo Contrast  Result Date: 12/14/2020 CLINICAL DATA:  found to have low oxygen and placed on 2L [place]. PE suspected, low/intermediate prob, positive D-dimer EXAM: CT ANGIOGRAPHY CHEST WITH CONTRAST TECHNIQUE: Multidetector CT imaging of the chest was performed using the standard protocol during bolus administration of intravenous contrast. Multiplanar CT image reconstructions and MIPs were obtained to evaluate the vascular anatomy. CONTRAST:  62mL OMNIPAQUE IOHEXOL 350 MG/ML SOLN COMPARISON:  None. FINDINGS: Lines and tubes: Dialysis catheter with tip terminating at the inferior cavoatrial junction. Cardiovascular: Satisfactory opacification of the pulmonary arteries to the segmental level. Left lower lobe segmental pulmonary embolus. Normal heart size. No pericardial effusion. Atherosclerotic plaque. At least right main coronary artery calcification. Incidentally noted duplicated superior vena cava. Mediastinum/Nodes: No enlarged mediastinal, hilar, or axillary lymph nodes. Thyroid gland, trachea, and esophagus demonstrate no significant findings. Debris within the right mainstem bronchi. Elevated right hemidiaphragm. Lungs/Pleura: Trace consolidation with air bronchogram at the right base. No pulmonary nodule. No pulmonary mass. No pleural effusion. No pneumothorax. Upper Abdomen: No acute abnormality. Musculoskeletal: No chest wall abnormality. No suspicious lytic or blastic osseous lesions. No acute displaced fracture. Multilevel degenerative changes of the spine. Partially visualized right upper extremity with likely severe degenerative changes of the wrist. Review of the MIP images confirms the above findings. IMPRESSION: 1. Left lower lobe segmental pulmonary embolus. No findings of right heart strain or pulmonary infarction. 2. Debris within the right mainstem bronchi with trace consolidation at the right base which may be due to atelectasis in the setting of an elevated right hemidiaphragm versus  developing infection/inflammation. 3. Dialysis catheter with tip terminating at the inferior cavoatrial junction. 4.  Aortic Atherosclerosis (ICD10-I70.0). These results were called by telephone at the time of interpretation on 12/14/2020 at 12:04 am to provider Dr. Wyvonnia Dusky, who verbally acknowledged these results. Electronically Signed   By: Iven Finn M.D.   On: 12/14/2020 00:15   DG Chest Port 1 View  Result Date: 12/27/2020 CLINICAL DATA:  76 year old female with history of wound infection. Sepsis. EXAM: PORTABLE CHEST 1 VIEW COMPARISON:  Chest x-ray 01/15/2021. FINDINGS: Right internal jugular PermCath with tip terminating in the inferior aspect of the right atrium. Chronic elevation of the right hemidiaphragm, similar to the prior study. Lung volumes are low. No consolidative airspace disease. No pleural effusions. No pneumothorax. No pulmonary nodule or mass noted. Pulmonary vasculature and the cardiomediastinal silhouette are within normal limits. Atherosclerotic calcifications in the  thoracic aorta. Healing nondisplaced fracture of the lateral aspect of the left ninth rib is noted. IMPRESSION: 1. Low lung volumes without radiographic evidence of acute cardiopulmonary disease. 2. Aortic atherosclerosis. 3. Healing nondisplaced left lateral ninth rib fracture. Electronically Signed   By: Vinnie Langton M.D.   On: 12/27/2020 05:54   DG Chest Port 1 View  Result Date: 01/01/2021 CLINICAL DATA:  Possible sepsis. EXAM: PORTABLE CHEST 1 VIEW COMPARISON:  CT a chest in chest x-ray dated December 13, 2020. FINDINGS: Unchanged tunneled right internal jugular dialysis catheter with tip in the inferior right atrium. The heart size and mediastinal contours are within normal limits. Normal pulmonary vascularity. No focal consolidation, pleural effusion, or pneumothorax. Unchanged elevation of the right hemidiaphragm. No acute osseous abnormality. IMPRESSION: 1. No active disease. Electronically Signed    By: Titus Dubin M.D.   On: 12/30/2020 15:11   DG Chest Port 1 View  Result Date: 12/13/2020 CLINICAL DATA:  Hypoxia, short of breath EXAM: PORTABLE CHEST 1 VIEW COMPARISON:  11/12/2020 FINDINGS: Single frontal view of the chest demonstrates stable right internal jugular dialysis catheter. The cardiac silhouette is unremarkable. No airspace disease, effusion, or pneumothorax. Chronic elevation right hemidiaphragm. IMPRESSION: 1. No acute intrathoracic process. Electronically Signed   By: Randa Ngo M.D.   On: 12/13/2020 16:37    Microbiology: Recent Results (from the past 240 hour(s))  Resp Panel by RT-PCR (Flu A&B, Covid) Nasopharyngeal Swab     Status: None   Collection Time: 12/23/2020  2:48 PM   Specimen: Nasopharyngeal Swab; Nasopharyngeal(NP) swabs in vial transport medium  Result Value Ref Range Status   SARS Coronavirus 2 by RT PCR NEGATIVE NEGATIVE Final    Comment: (NOTE) SARS-CoV-2 target nucleic acids are NOT DETECTED.  The SARS-CoV-2 RNA is generally detectable in upper respiratory specimens during the acute phase of infection. The lowest concentration of SARS-CoV-2 viral copies this assay can detect is 138 copies/mL. A negative result does not preclude SARS-Cov-2 infection and should not be used as the sole basis for treatment or other patient management decisions. A negative result may occur with  improper specimen collection/handling, submission of specimen other than nasopharyngeal swab, presence of viral mutation(s) within the areas targeted by this assay, and inadequate number of viral copies(<138 copies/mL). A negative result must be combined with clinical observations, patient history, and epidemiological information. The expected result is Negative.  Fact Sheet for Patients:  EntrepreneurPulse.com.au  Fact Sheet for Healthcare Providers:  IncredibleEmployment.be  This test is no t yet approved or cleared by the [place] FDA and  has been authorized for detection and/or diagnosis of SARS-CoV-2 by FDA under an Emergency Use Authorization (EUA). This EUA will remain  in effect (meaning this test can be used) for the duration of the COVID-19 declaration under Section 564(b)(1) of the Act, 21 U.S.C.section 360bbb-3(b)(1), unless the authorization is terminated  or revoked sooner.       Influenza A by PCR NEGATIVE NEGATIVE Final   Influenza B by PCR NEGATIVE NEGATIVE Final    Comment: (NOTE) The Xpert Xpress SARS-CoV-2/FLU/RSV plus assay is intended as an aid in the diagnosis of influenza from Nasopharyngeal swab specimens and should not be used as a sole basis for treatment. Nasal washings and aspirates are unacceptable for Xpert Xpress SARS-CoV-2/FLU/RSV testing.  Fact Sheet for Patients: EntrepreneurPulse.com.au  Fact Sheet for Healthcare Providers: IncredibleEmployment.be  This test is not yet approved or cleared by the [place] FDA and has been authorized for detection  and/or diagnosis of SARS-CoV-2 by FDA under an Emergency Use Authorization (EUA). This EUA will remain in effect (meaning this test can be used) for the duration of the COVID-19 declaration under Section 564(b)(1) of the Act, 21 U.S.C. section 360bbb-3(b)(1), unless the authorization is terminated or revoked.  Performed at [facility], [place] [address] [place], [place] [address]   Blood Culture (routine x 2)     Status: Abnormal   Collection Time: 01/14/2021  3:03 PM   Specimen: BLOOD  Result Value Ref Range Status   Specimen Description BLOOD LEFT ANTECUBITAL  Final   Special Requests   Final    BOTTLES DRAWN AEROBIC AND ANAEROBIC Blood Culture adequate volume   Culture  Setup Time   Final    GRAM NEGATIVE RODS IN BOTH AEROBIC AND ANAEROBIC BOTTLES CRITICAL RESULT CALLED TO, READ BACK BY AND VERIFIED WITH: PHARMD E.MARTIN AT 1324 ON 12/27/2020 BY T.SAAD. Performed at  [facility], [place] [address], [place], [name] [address]    Culture [facility] MORGANII PROTEUS MIRABILIS  (A)  Final   Report Status 12/30/2020 FINAL  Final   Organism ID, Bacteria MORGANELLA MORGANII  Final   Organism ID, Bacteria PROTEUS MIRABILIS  Final      Susceptibility   Morganella morganii - MIC*    AMPICILLIN >=32 RESISTANT Resistant     CEFAZOLIN >=64 RESISTANT Resistant     CEFTAZIDIME 32 RESISTANT Resistant     CIPROFLOXACIN <=0.25 SENSITIVE Sensitive     GENTAMICIN <=1 SENSITIVE Sensitive     IMIPENEM 2 SENSITIVE Sensitive     TRIMETH/SULFA <=20 SENSITIVE Sensitive     AMPICILLIN/SULBACTAM >=32 RESISTANT Resistant     PIP/TAZO <=4 SENSITIVE Sensitive     * MORGANELLA MORGANII   Proteus mirabilis - MIC*    AMPICILLIN 4 SENSITIVE Sensitive     CEFAZOLIN 8 SENSITIVE Sensitive     CEFEPIME <=0.12 SENSITIVE Sensitive     CEFTAZIDIME <=1 SENSITIVE Sensitive     CEFTRIAXONE <=0.25 SENSITIVE Sensitive     CIPROFLOXACIN <=0.25 SENSITIVE Sensitive     GENTAMICIN <=1 SENSITIVE Sensitive     IMIPENEM >=16 RESISTANT Resistant     TRIMETH/SULFA <=20 SENSITIVE Sensitive     AMPICILLIN/SULBACTAM <=2 SENSITIVE Sensitive     PIP/TAZO <=4 SENSITIVE Sensitive     * PROTEUS MIRABILIS  Blood Culture ID Panel (Reflexed)     Status: Abnormal   Collection Time: 12/24/2020  3:03 PM  Result Value Ref Range Status   Enterococcus faecalis NOT DETECTED NOT DETECTED Final   Enterococcus Faecium NOT DETECTED NOT DETECTED Final   Listeria monocytogenes NOT DETECTED NOT DETECTED Final   Staphylococcus species NOT DETECTED NOT DETECTED Final   Staphylococcus aureus (BCID) NOT DETECTED NOT DETECTED Final   Staphylococcus epidermidis NOT DETECTED NOT DETECTED Final   Staphylococcus lugdunensis NOT DETECTED NOT DETECTED Final   Streptococcus species NOT DETECTED NOT DETECTED Final   Streptococcus agalactiae NOT DETECTED NOT DETECTED Final   Streptococcus pneumoniae NOT DETECTED NOT DETECTED  Final   Streptococcus pyogenes NOT DETECTED NOT DETECTED Final   A.calcoaceticus-baumannii NOT DETECTED NOT DETECTED Final   Bacteroides fragilis NOT DETECTED NOT DETECTED Final   Enterobacterales DETECTED (A) NOT DETECTED Final    Comment: Enterobacterales represent a large order of gram negative bacteria, not a single organism. CRITICAL RESULT CALLED TO, READ BACK BY AND VERIFIED WITH: PHARMD E.MARTIN AT 7253 ON 12/27/2020 BY T.SAAD.    Enterobacter cloacae complex NOT DETECTED NOT DETECTED Final  Escherichia coli NOT DETECTED NOT DETECTED Final   Klebsiella aerogenes NOT DETECTED NOT DETECTED Final   Klebsiella oxytoca NOT DETECTED NOT DETECTED Final   Klebsiella pneumoniae NOT DETECTED NOT DETECTED Final   Proteus species DETECTED (A) NOT DETECTED Final    Comment: CRITICAL RESULT CALLED TO, READ BACK BY AND VERIFIED WITH: PHARMD E.MARTIN AT 5631 ON 12/27/2020 BY T.SAAD.    Salmonella species NOT DETECTED NOT DETECTED Final   Serratia marcescens NOT DETECTED NOT DETECTED Final   Haemophilus influenzae NOT DETECTED NOT DETECTED Final   Neisseria meningitidis NOT DETECTED NOT DETECTED Final   Pseudomonas aeruginosa NOT DETECTED NOT DETECTED Final   Stenotrophomonas maltophilia NOT DETECTED NOT DETECTED Final   Candida albicans NOT DETECTED NOT DETECTED Final   Candida auris NOT DETECTED NOT DETECTED Final   Candida glabrata NOT DETECTED NOT DETECTED Final   Candida krusei NOT DETECTED NOT DETECTED Final   Candida parapsilosis NOT DETECTED NOT DETECTED Final   Candida tropicalis NOT DETECTED NOT DETECTED Final   Cryptococcus neoformans/gattii NOT DETECTED NOT DETECTED Final   CTX-M ESBL NOT DETECTED NOT DETECTED Final   Carbapenem resistance IMP NOT DETECTED NOT DETECTED Final   Carbapenem resistance KPC NOT DETECTED NOT DETECTED Final   Carbapenem resistance NDM NOT DETECTED NOT DETECTED Final   Carbapenem resist OXA 48 LIKE NOT DETECTED NOT DETECTED Final   Carbapenem  resistance VIM NOT DETECTED NOT DETECTED Final    Comment: Performed at [facility], [address], [place], [place] [address]     Labs: Basic Metabolic Panel: Recent Labs  Lab 01/19/2021 1503 12/27/20 0300  NA 141 143  K 4.6 4.1  CL 101 109  CO2 17* 15*  GLUCOSE 333* 191*  BUN 62* 62*  CREATININE 9.70* 8.87*  CALCIUM 9.1 8.4*   Liver Function Tests: Recent Labs  Lab 01/11/2021 1503  AST 19  ALT 14  ALKPHOS 177*  BILITOT 1.1  PROT 6.8  ALBUMIN 1.7*   No results for input(s): LIPASE, AMYLASE in the last 168 hours. No results for input(s): AMMONIA in the last 168 hours. CBC: Recent Labs  Lab 01/19/2021 1503 12/27/20 0349 12/27/20 0433 12/27/20 0821 12/27/20 1624 12/28/20 0319  WBC 50.6* 31.0*  --  31.4* 24.5* 23.5*  NEUTROABS 39.5*  --   --   --   --   --   HGB 8.6* 5.1* 5.5* 6.3* 9.6* 9.5*  HCT 26.6* 16.6* 16.3* 18.2* 27.6* 26.3*  MCV 79.2* 84.3  --  76.5* 79.5* 77.1*  PLT 170 PLATELET CLUMPS NOTED ON SMEAR, UNABLE TO ESTIMATE  --  104* 86* 76*   Cardiac Enzymes: No results for input(s): CKTOTAL, CKMB, CKMBINDEX, TROPONINI in the last 168 hours. BNP: BNP (last 3 results) Recent Labs    02/15/20 2044 12/13/20 1608  BNP 73.8 134.9*    ProBNP (last 3 results) No results for input(s): PROBNP in the last 8760 hours.  CBG: Recent Labs  Lab 12/27/20 1852 12/27/20 2102 12/28/20 0613 12/28/20 1133 12/28/20 1552  GLUCAP 228* 181* 185* 157* 77       Signed:  Florencia Reasons MD, PhD, FACP  [facility] 2021/01/28, 5:19 PM

## 2021-01-21 NOTE — Progress Notes (Signed)
   Palliative Medicine Inpatient Follow Up Note  Consulting Provider: Lequita Halt, MD   Reason for consult:   [place] [facility] Consult  Reason for Consult? Severe sepsis and pressure ulcers.    HPI:  Per intake H&P --> Joann Gutierrez is a 76 y.o. female with medical history significant of ESRD on HD TTS, chronic sacral pressure ulcer, PE on Eliquis, COPD, OSA, chronic ambulation dysfunction wheelchair-bound, was brought in by family for worsening of sacral pressure ulcer and fever. Found to be in severe sepsis with profound anemia.    Patient recently admitted 11/09/2020-11/23/2020 for hypoxia secondary to acute left upper lobe PE.  She was initially on IV heparin and transition to oral Eliquis however patient began to refuse oral medications.  Per discharge summary, extensive goals of care discussions with palliative care, nephrology, primary team, and family resulted in transition to DNR/DNI status and discharged to home with hospice care. Reversed by family as they noted improvement and wished to continue dialysis.    Readmitted 11/24 due to hypoxia again r/t PE. Met with PMT and improved after a few days of admission. Family adamant at that time to continue with full scope of care.   Has been followed by [facility] as an outpatient  Patient recently appointed a legal decision maker, Joann Gutierrez Per documents it appears this occurred on December 2 of this year.  This has been the person who has been appointed to make healthcare decisions enabling the healthcare team to transition Joann Gutierrez to a DO NOT RESUSCITATE order and to provide comfort measures.  Today's Discussion (Jan 13, 2021):  *Please note that this is a verbal dictation therefore any spelling or grammatical errors are due to the "[employer]" system interpretation.  Chart reviewed.   I met with Joann Gutierrez at bedside. She was resting comfortably without any s/s of distress per  PAIN-AD tool.   I checked in with her RN, Joann Gutierrez who shares that she has no concerns regarding this patient today.  Patients legal guardian provided with phone updates.  Objective Assessment: Vital Signs Vitals:   12/30/20 2027 2021-01-13 0544  BP: (!) 122/56 (!) 85/43  Pulse: (!) 109 97  Resp: 15 18  Temp: (!) 97.2 F (36.2 C)   SpO2: 94% 92%   No intake or output data in the 24 hours ending January 13, 2021 0826  Last Weight  Most recent update: 12/28/2020  4:04 AM    Weight  72.6 kg (160 lb)            Gen:  Chronically ill appearing AA F   HEENT: Dry mucous membranes CV: Irregular rate and rhythm PULM: On RA ABD: Soft EXT: Generalized anasarca Neuro: Somnolent though opens eyes  SUMMARY OF RECOMMENDATIONS   DNAR/DNI  Comfort focused care  Fentanyl low dose 33mcg Q8H ATC for generalized discomfort and Q1H PRN  Unrestricted visitation   Anticipate in Hospital death  Time Spent: 15 Greater than 50% of the time was spent in counseling and coordination of care ______________________________________________________________________________________ [place] Team Team Cell Phone: [phone] Please utilize secure chat with additional questions, if there is no response within 30 minutes please call the above phone number  Palliative Medicine Team providers are available by phone from 7am to 7pm daily and can be reached through the team cell phone.  Should this patient require assistance outside of these hours, please call the patient's attending physician.

## 2021-01-21 DEATH — deceased

## 2021-05-11 IMAGING — DX DG CHEST 1V PORT
1 series · 1 of 1 positions shown · non-contrast
Comparison: Chest radiograph dated 11/20/2017 and 10/06/2017.

CLINICAL DATA: 74-year-old female with shortness of breath.

EXAM:
PORTABLE CHEST 1 VIEW

[chest ap]
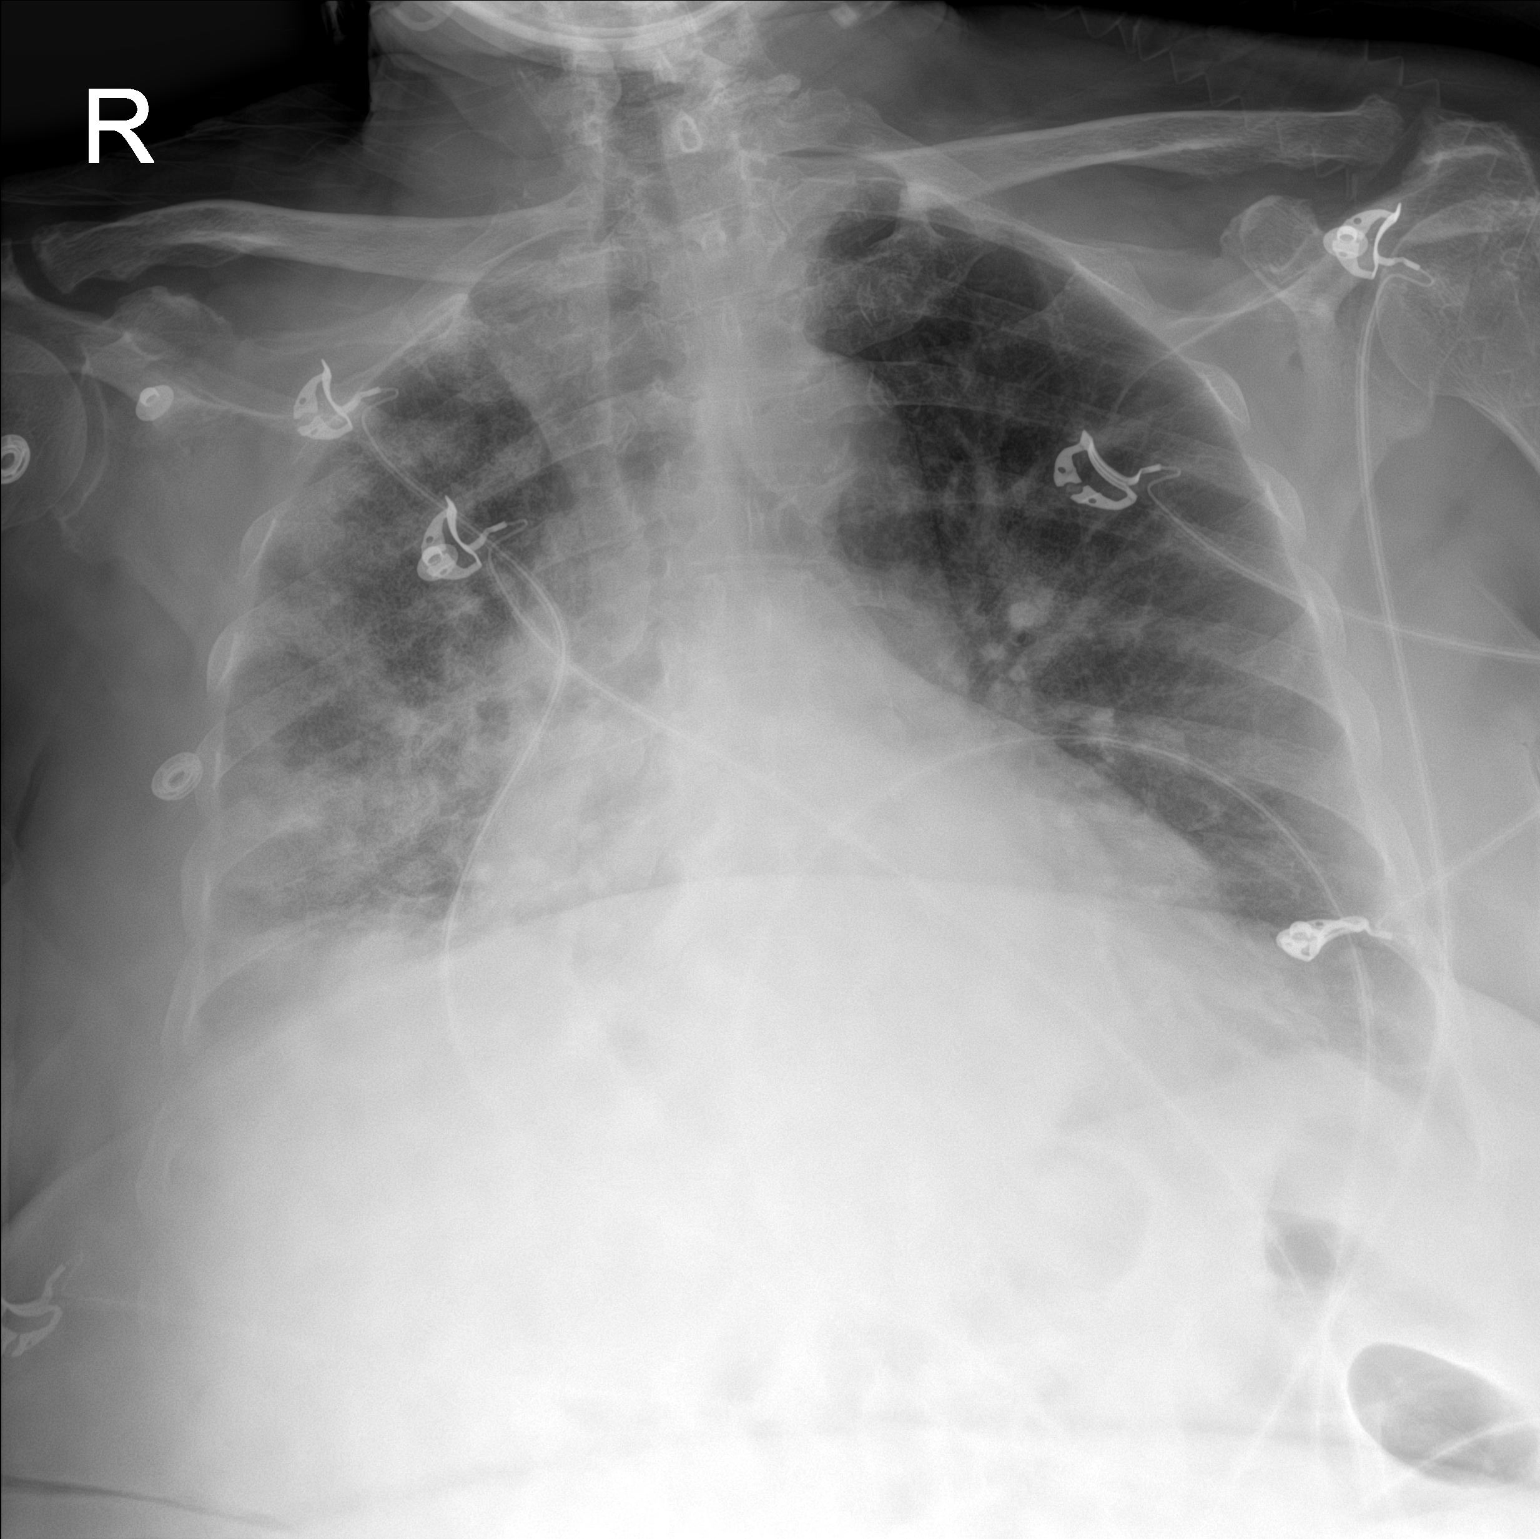

[1 of 1 positions shown; findings below may reference images not displayed]

FINDINGS: Bilateral streaky and confluent densities, right greater left, most
consistent with developing infiltrate, possibly viral or atypical in
etiology. Clinical correlation is recommended. No large pleural
effusion pneumothorax. Mild cardiomegaly. No acute osseous
pathology.
IMPRESSION: Findings most consistent with developing infiltrate predominantly
involving the right lung.

## 2021-05-12 IMAGING — US US RENAL
1 series · 14 of 25 positions shown · non-contrast
Comparison: Ultrasound 03/19/2019.

CLINICAL DATA: Acute renal injury.

EXAM:
RENAL / URINARY TRACT ULTRASOUND COMPLETE

[Series 1: us renal · 14 of 35 slices shown]
[im 1/35]
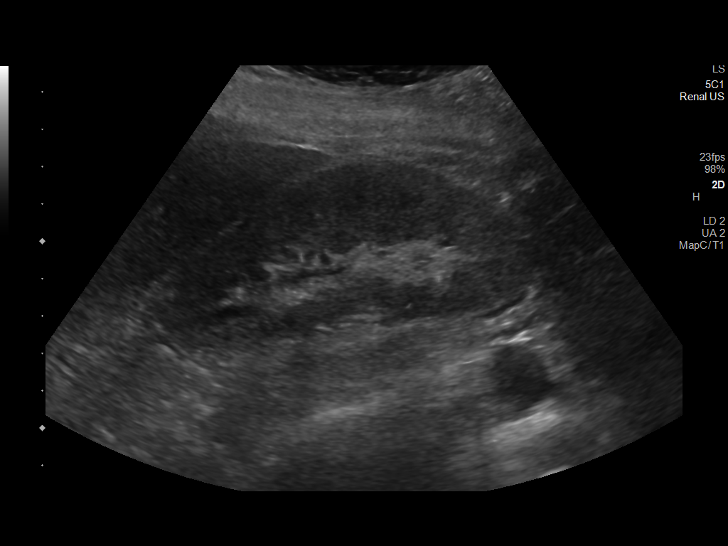
[im 3/35]
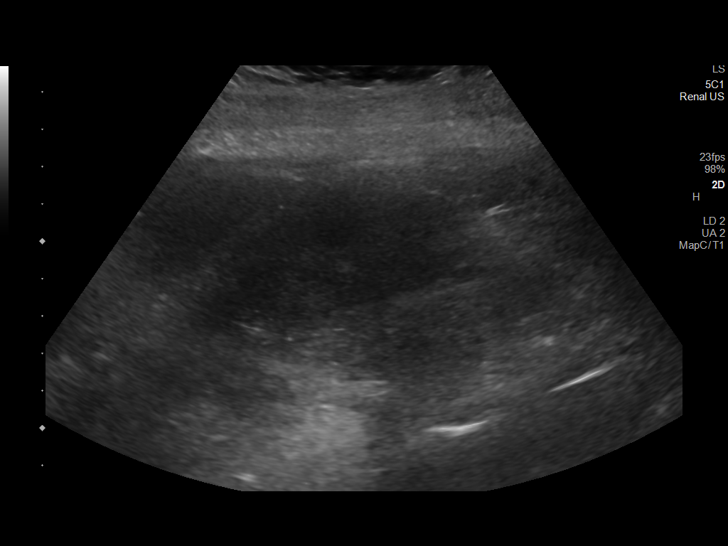
[im 6/35]
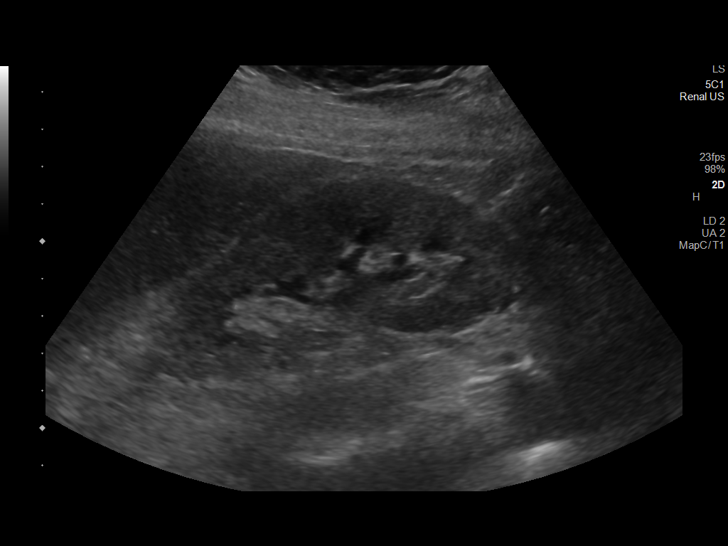
[im 9/35]
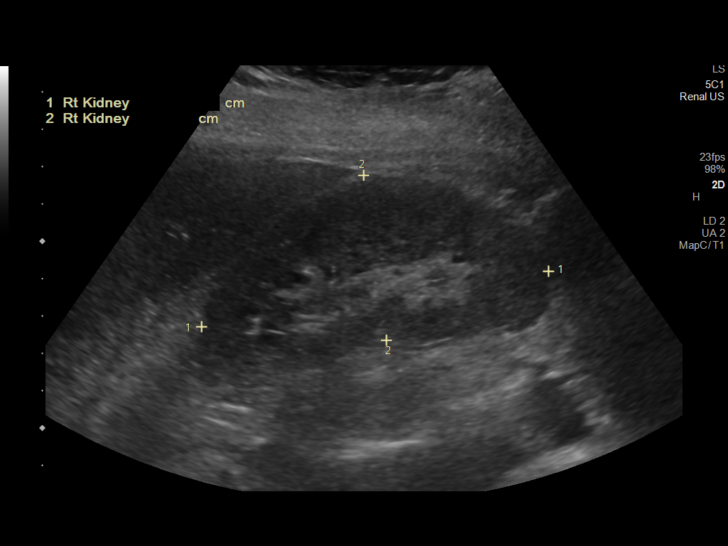
[im 12/35]
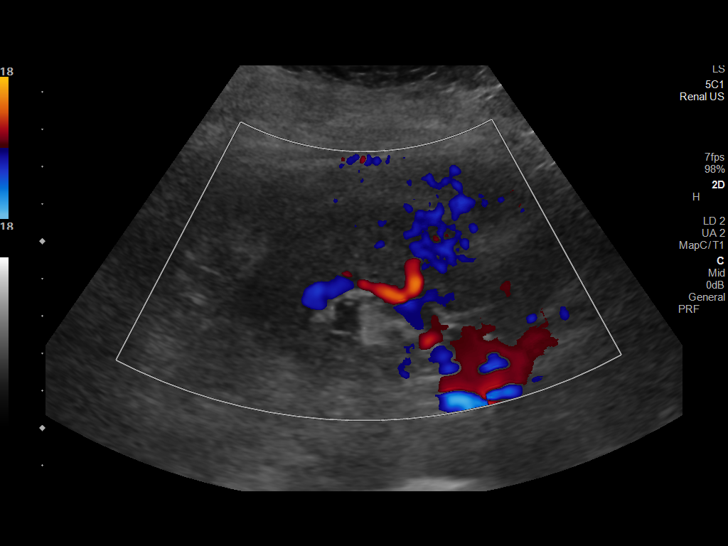
[im 13/35]
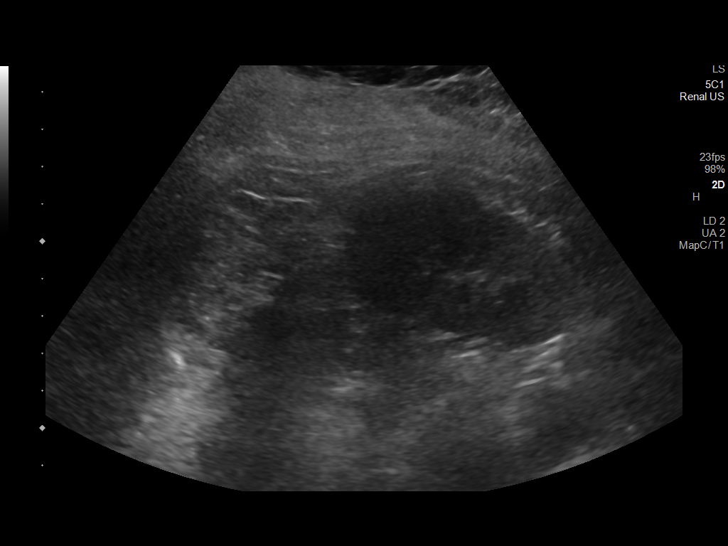
[im 16/35]
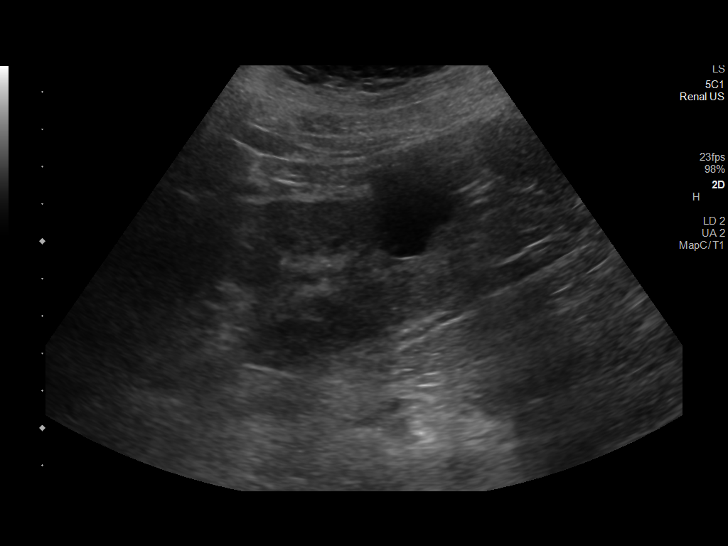
[im 19/35]
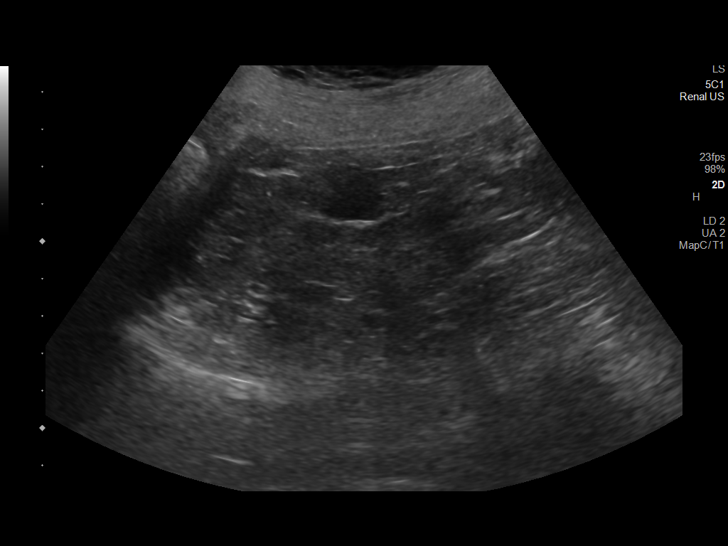
[im 22/35]
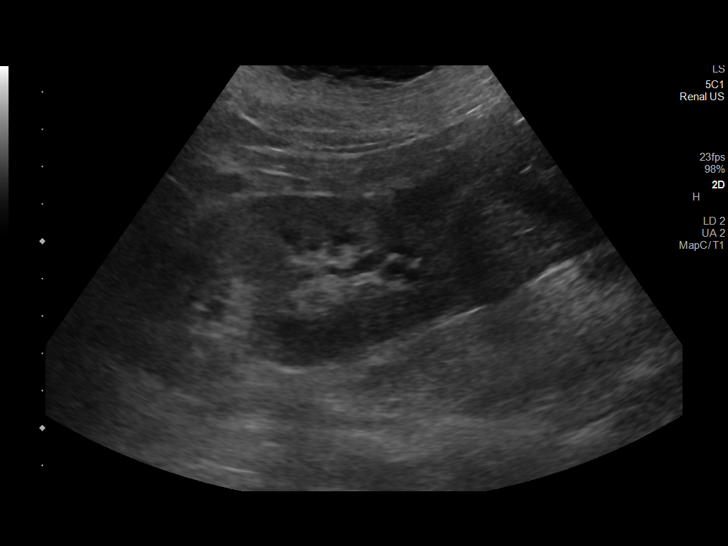
[im 23/35]
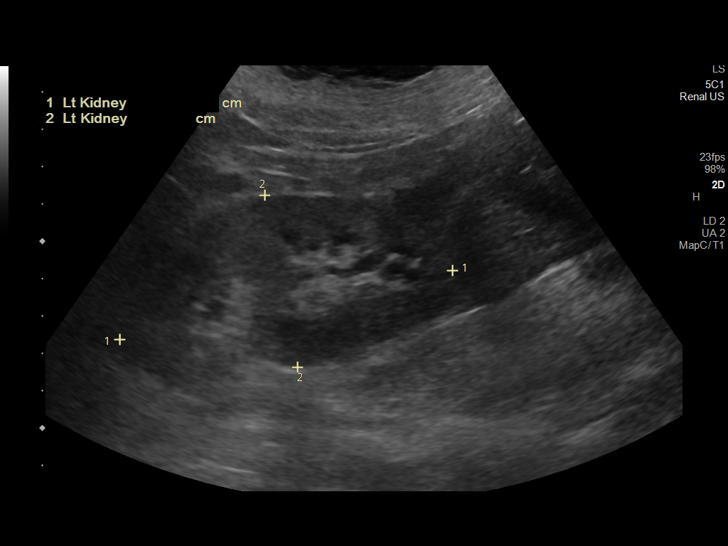
[im 26/35]
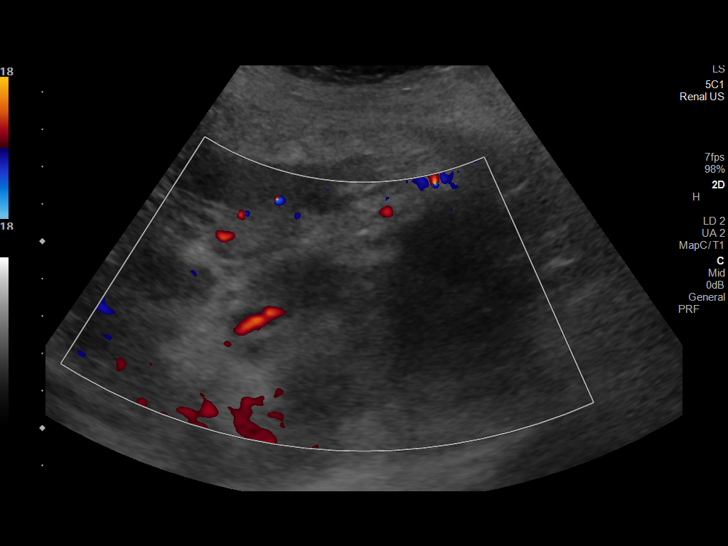
[im 29/35]
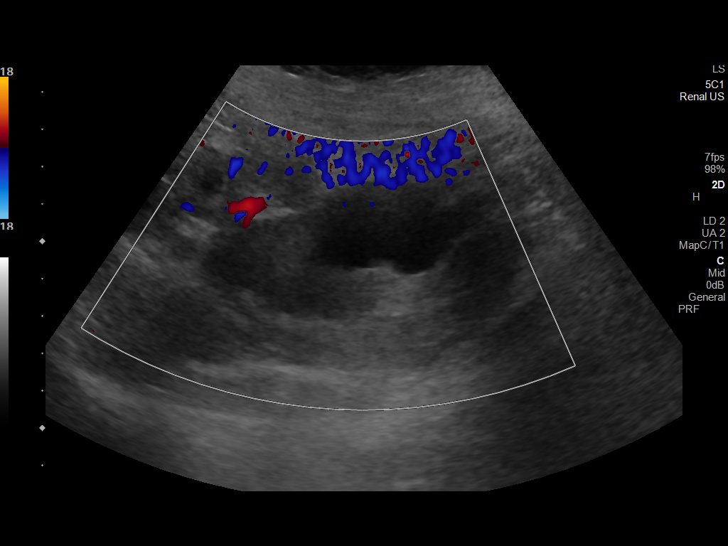
[im 32/35]
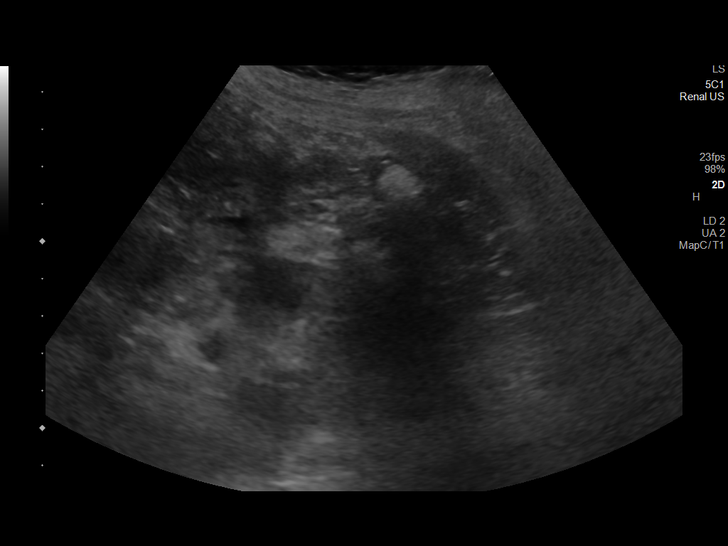
[im 35/35]
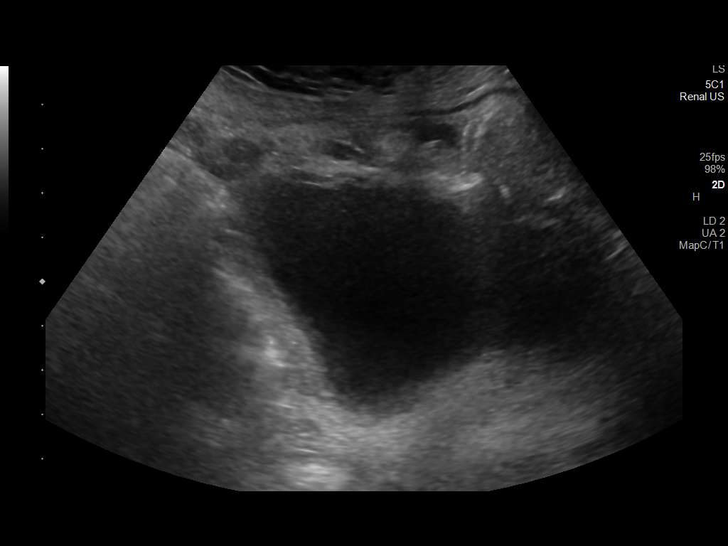

[14 of 25 positions shown; findings below may reference images not displayed]

FINDINGS: Right Kidney:

Renal measurements: 9.4 x 4.5 x 4.3 cm = volume: 93.6 mL. Mild
increased echogenicity cannot be excluded. No mass or hydronephrosis
visualized.

Left Kidney:

Renal measurements: 9.1 x 4.7 x 4.2 cm = volume: 94.8 mL. Mild
increased echogenicity cannot be excluded. 3.6 cm simple cyst lower
pole. No hydronephrosis visualized.

Bladder:

Appears normal for degree of bladder distention.

Other:

None.
IMPRESSION: 1. Mild increased echogenicity both kidneys suggesting chronic renal
disease cannot be excluded.
2. 3.6 cm simple cyst lower pole left kidney. No acute abnormality.
No hydronephrosis. No bladder distention.

## 2021-05-13 IMAGING — US IR TUNNELED CV CATH W/O PORT/PUMP >5YO
1 series · 1 of 1 positions shown · non-contrast
Comparison: none

CLINICAL DATA: Acute kidney injury, VONSS-JY infection and need for
hemodialysis. An indwelling AV fistula could not be used due to
infiltration.

[Series 1: ir tunneled cv cath w/o port/pump >5yo · 1 of 1 slices shown]
[im 1/1]
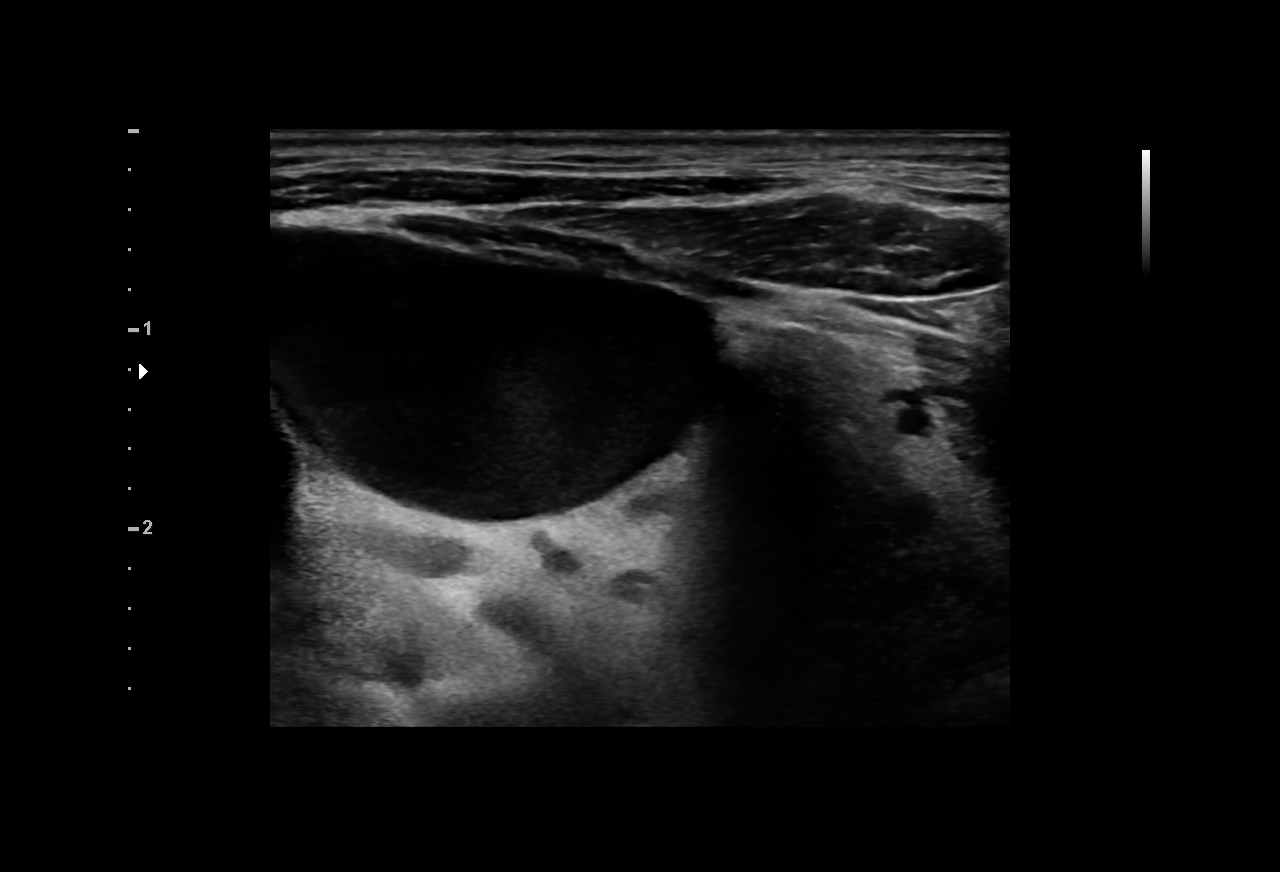

[1 of 1 positions shown; findings below may reference images not displayed]

EXAM:
TUNNELED CENTRAL VENOUS HEMODIALYSIS CATHETER PLACEMENT WITH
ULTRASOUND AND FLUOROSCOPIC GUIDANCE

ANESTHESIA/SEDATION:
1.0 mg IV Versed; 25 mcg IV Fentanyl.

Total Moderate Sedation Time:   15 minutes.

The patient's level of consciousness and physiologic status were
continuously monitored during the procedure by Radiology nursing.

MEDICATIONS:
2 g IV Ancef.

FLUOROSCOPY TIME:  Hiking seconds.  2.2 mGy.

PROCEDURE:
The procedure, risks, benefits, and alternatives were explained to
the patient. Questions regarding the procedure were encouraged and
answered. The patient understands and consents to the procedure. A
timeout was performed prior to initiating the procedure.

The right neck and chest were prepped with chlorhexidine in a
sterile fashion, and a sterile drape was applied covering the
operative field. Maximum barrier sterile technique with sterile
gowns and gloves were used for the procedure. Local anesthesia was
provided with 1% lidocaine.

Ultrasound was used to confirm patency of the right internal jugular
vein. After creating a small venotomy incision, a 21 gauge needle
was advanced into the right internal jugular vein under direct,
real-time ultrasound guidance. Ultrasound image documentation was
performed. After securing guidewire access, an 8 Fr dilator was
placed. A J-wire was kinked to measure appropriate catheter length.

A palindrome tunneled hemodialysis catheter measuring 19 cm from tip
to cuff was chosen for placement. This was tunneled in a retrograde
fashion from the chest wall to the venotomy incision.

At the venotomy, serial dilatation was performed and a 50 Fr
peel-away sheath was placed over a guidewire. The catheter was then
placed through the sheath and the sheath removed. Final catheter
positioning was confirmed and documented with a fluoroscopic spot
image. The catheter was aspirated, flushed with saline, and injected
with appropriate volume heparin dwells.

The venotomy incision was closed with subcuticular 4-0 Vicryl.
Dermabond was applied to the incision. The catheter exit site was
secured with 0-Prolene retention sutures.

COMPLICATIONS:
None.  No pneumothorax.
FINDINGS: After catheter placement, the tip lies in the right atrium. The
catheter aspirates normally and is ready for immediate use.
IMPRESSION: Placement of tunneled hemodialysis catheter via the right internal
jugular vein. The catheter tip lies in the right atrium. The
catheter is ready for immediate use.

## 2021-05-17 IMAGING — DX DG ABDOMEN 1V
1 series · 1 of 1 positions shown · non-contrast
Comparison: None.

CLINICAL DATA: Abdominal pain.

EXAM:
ABDOMEN - 1 VIEW

[abdomen supine]
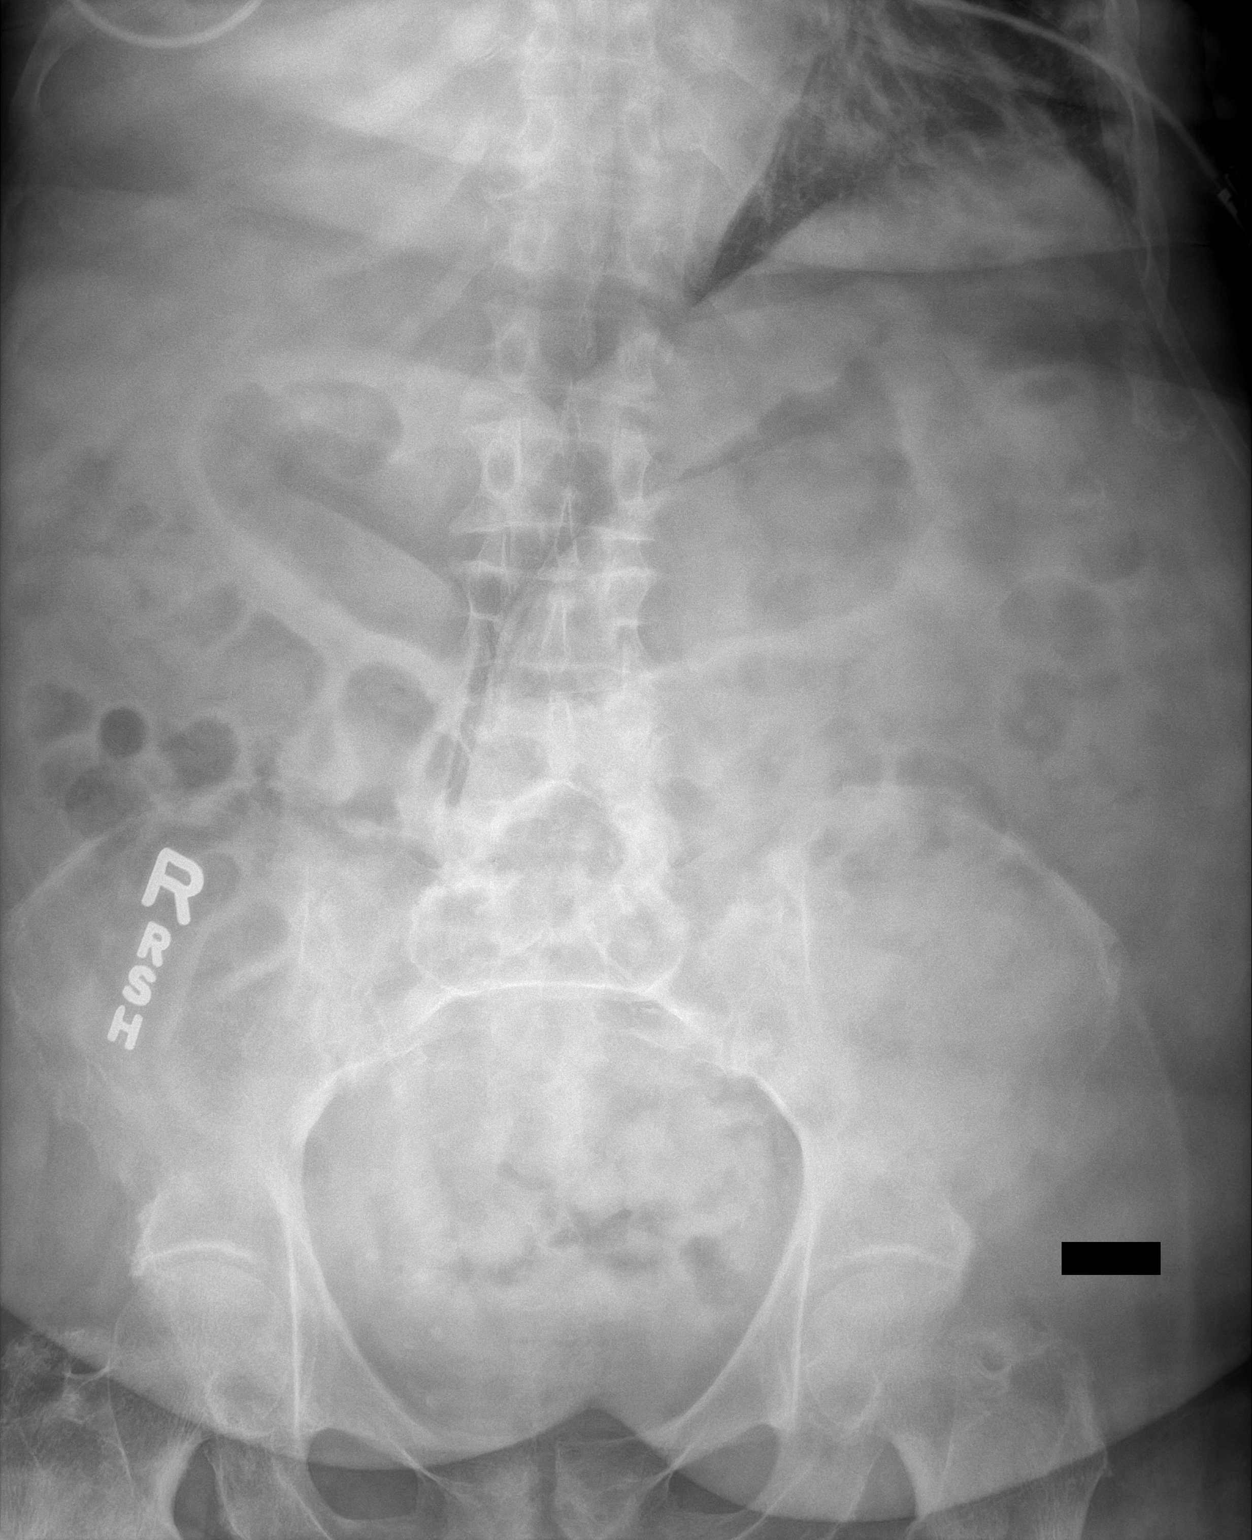

[1 of 1 positions shown; findings below may reference images not displayed]

FINDINGS: The bowel gas pattern is normal. No radio-opaque calculi or other
significant radiographic abnormality are seen.
IMPRESSION: Negative.

## 2021-05-25 IMAGING — DX DG CHEST 1V PORT
1 series · 1 of 1 positions shown · non-contrast
Comparison: February 15, 2020

CLINICAL DATA: [id number] positive.  Hypertension.

EXAM:
PORTABLE CHEST 1 VIEW

[chest ap]
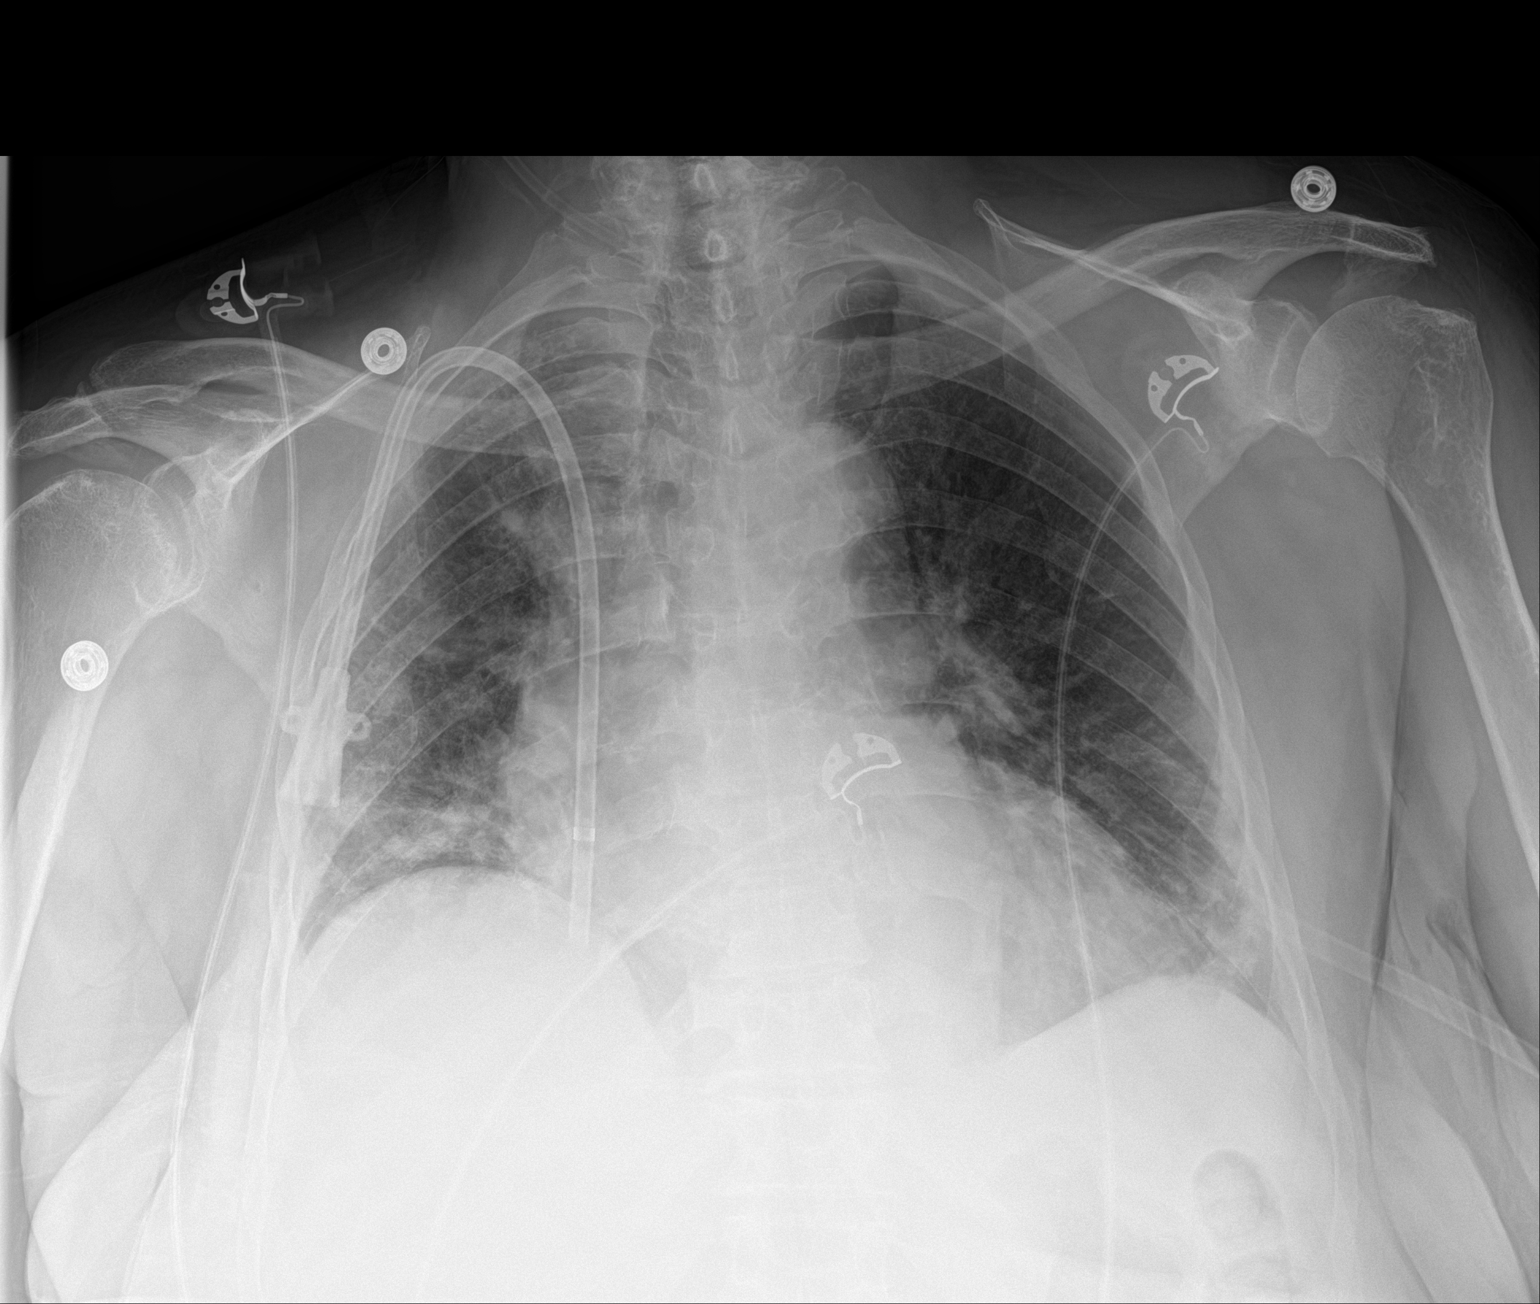

[1 of 1 positions shown; findings below may reference images not displayed]

FINDINGS: There are areas of ill-defined opacity in each lower lung region
without appreciable consolidation. Heart is mildly enlarged with
pulmonary vascularity normal. No adenopathy. Central catheter tip is
in the right atrium. No pneumothorax. No bone lesions.
IMPRESSION: Areas of ill-defined opacity in the lower lung regions, concerning
for atypical organism pneumonia in these areas. No consolidation.

Stable cardiomegaly. Central catheter tip in right atrium. No
pneumothorax.

## 2021-05-27 IMAGING — DX DG CHEST 1V PORT
1 series · 1 of 1 positions shown · non-contrast
Comparison: 02/29/2020

CLINICAL DATA: Short of breath

EXAM:
PORTABLE CHEST 1 VIEW

[chest ap]
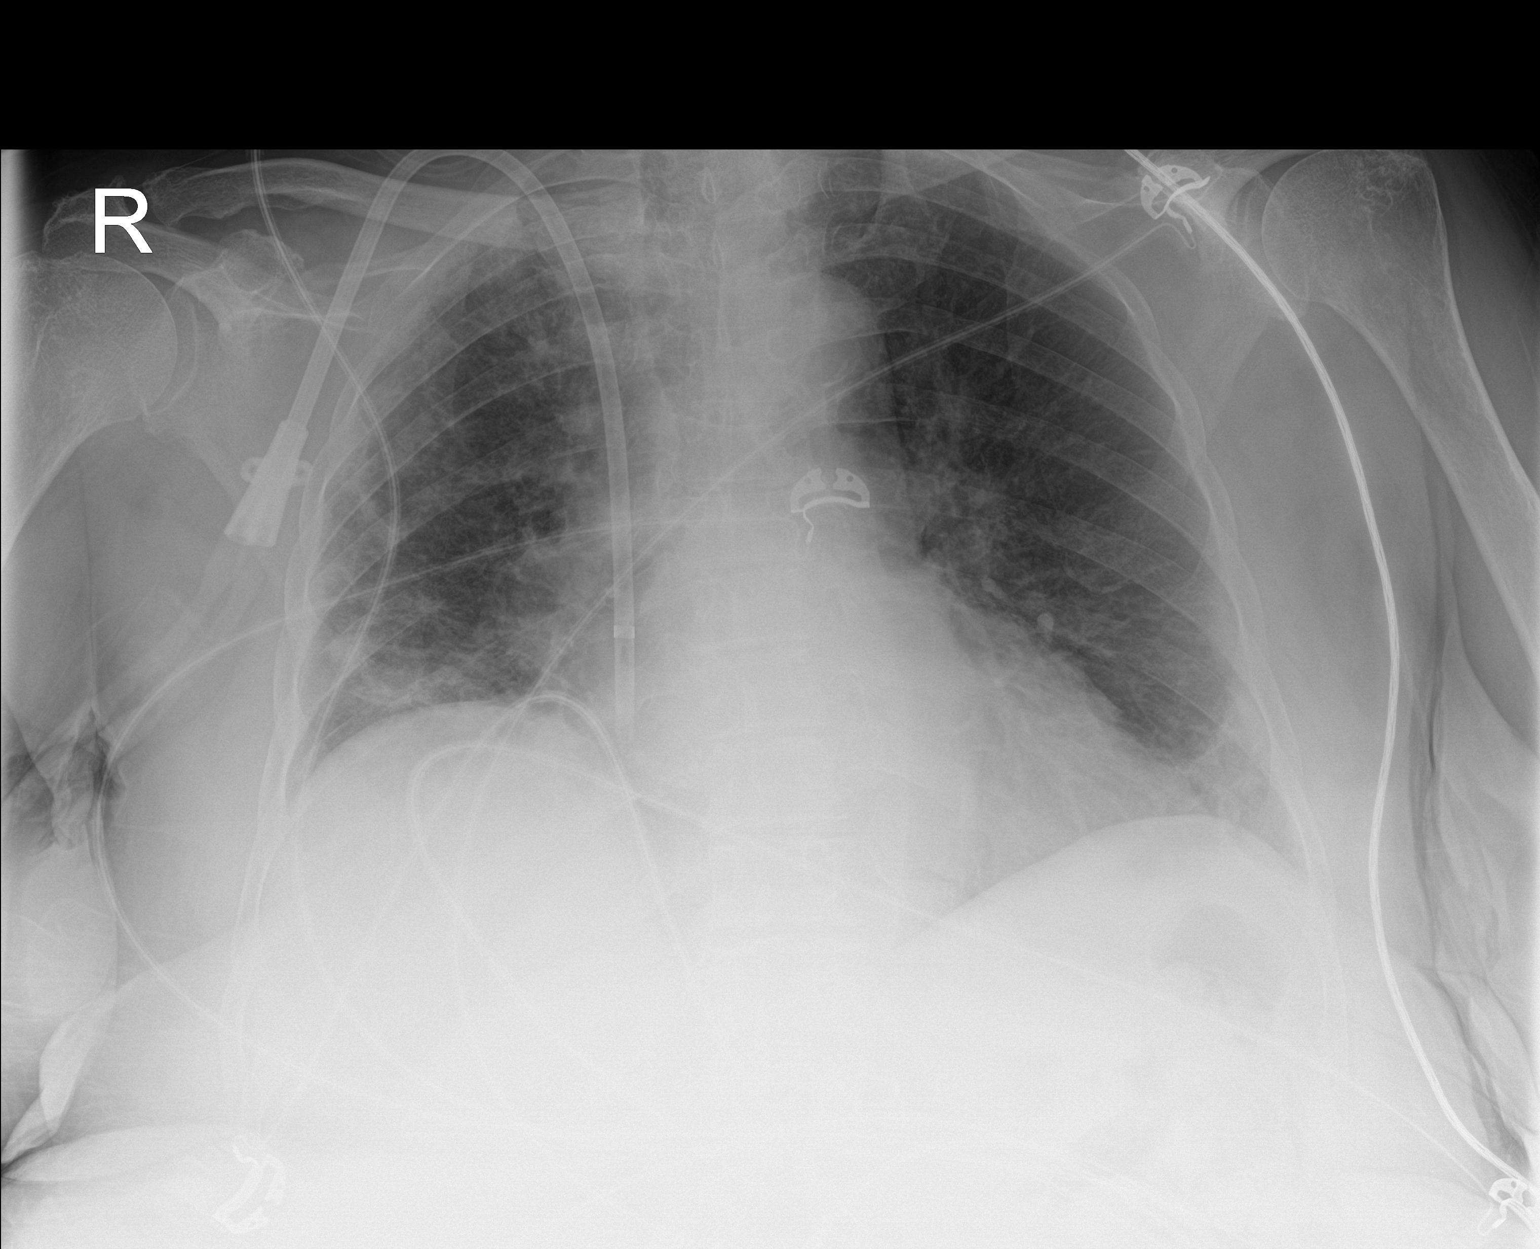

[1 of 1 positions shown; findings below may reference images not displayed]

FINDINGS: Bibasilar airspace disease with mild interval improvement. No
significant effusion. Right jugular central venous catheter tip in
the right atrium unchanged. No pneumothorax.
IMPRESSION: Mild improvement in bibasilar airspace disease right greater than
left.

## 2021-06-03 IMAGING — CT CT HEAD W/O CM
4 series · 17 of 47 positions shown, 19 images · non-contrast
Comparison: Head CT 02/16/2020

CLINICAL DATA: Mental status change, unknown cause

EXAM:
CT HEAD WITHOUT CONTRAST
TECHNIQUE: Contiguous axial images were obtained from the base of the skull
through the vertex without intravenous contrast.

[Series 3: head without · axial · non-contrast · 0.41mm/px · z∈[-94,+26]mm · 7 of 32 slices shown, 9 images]
[im 4/32  brain]
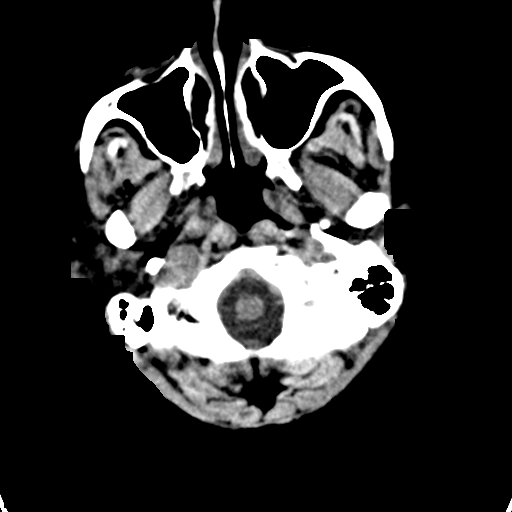
[im 4/32  bone]
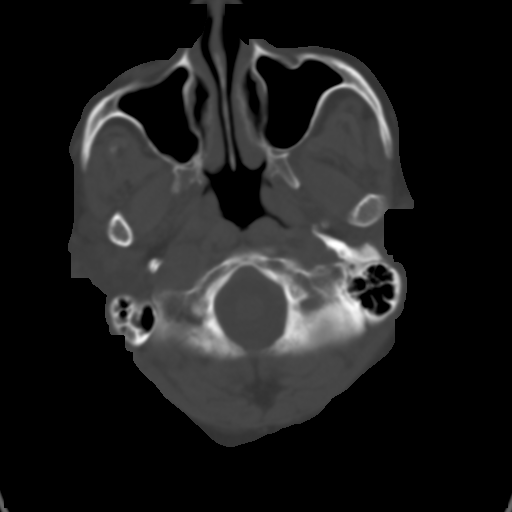
[im 8/32  brain]
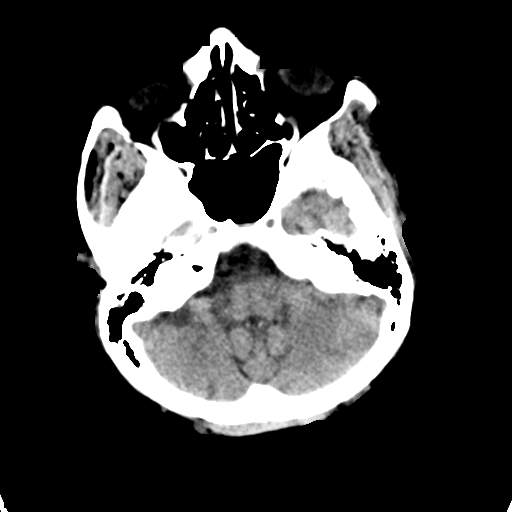
[im 12/32  brain]
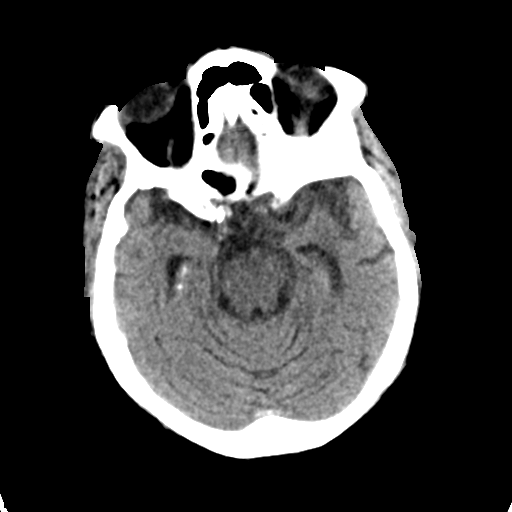
[im 16/32  brain]
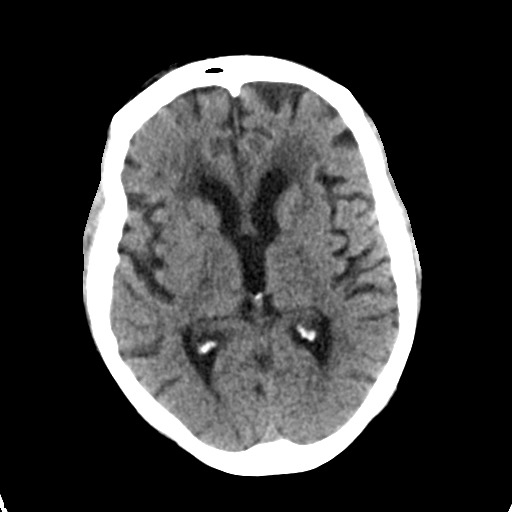
[im 20/32  brain]
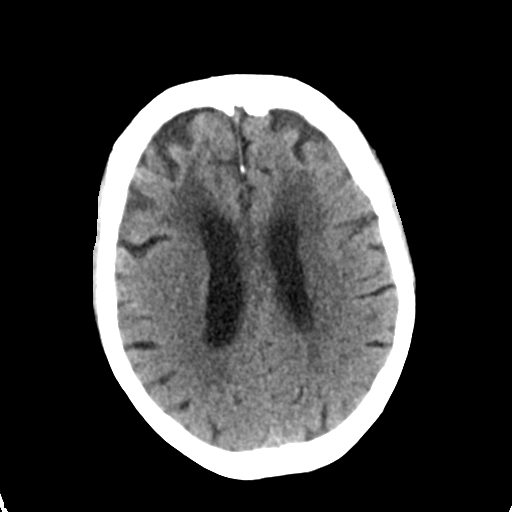
[im 20/32  bone]
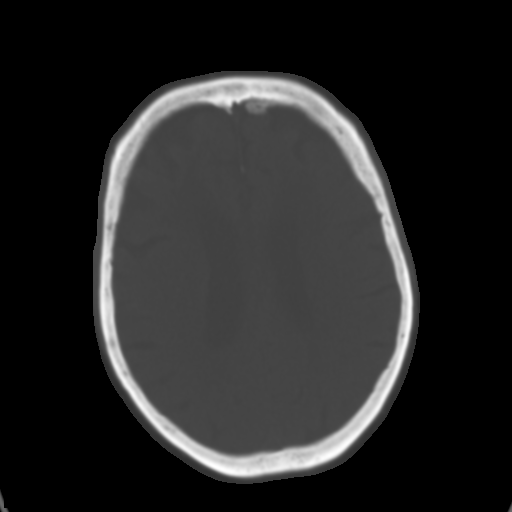
[im 24/32  brain]
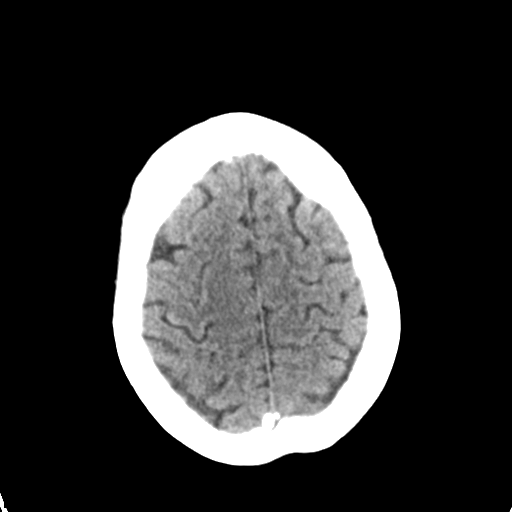
[im 28/32  brain]
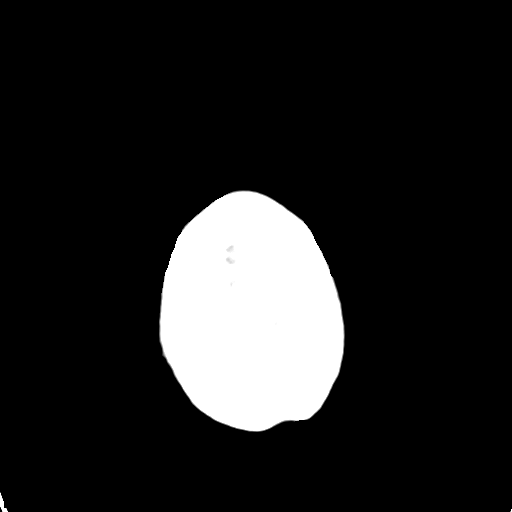

[Series 4: head bone · axial · 0.41mm/px · z∈[-95,-39]mm · 4 of 80 slices shown]
[im 8/80  bone]
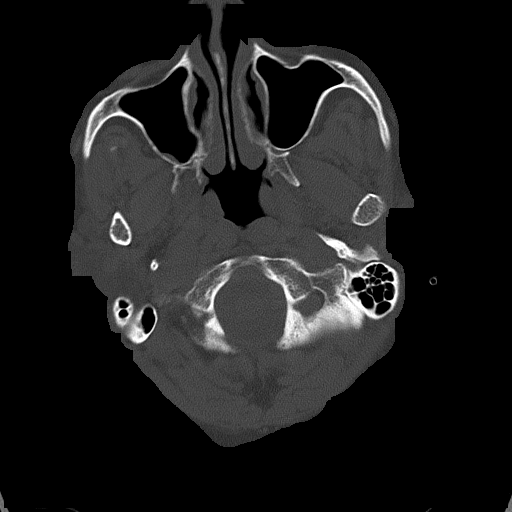
[im 16/80  bone]
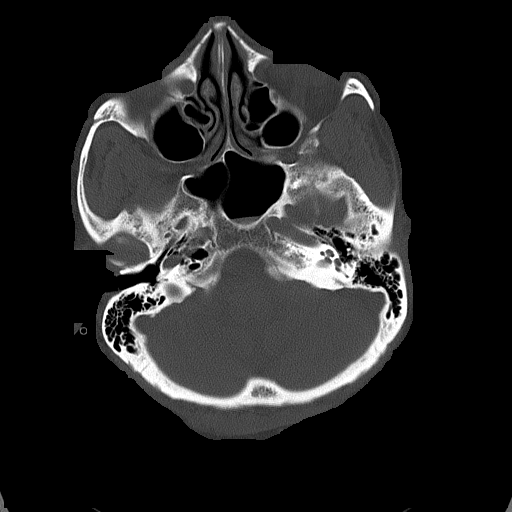
[im 24/80  bone]
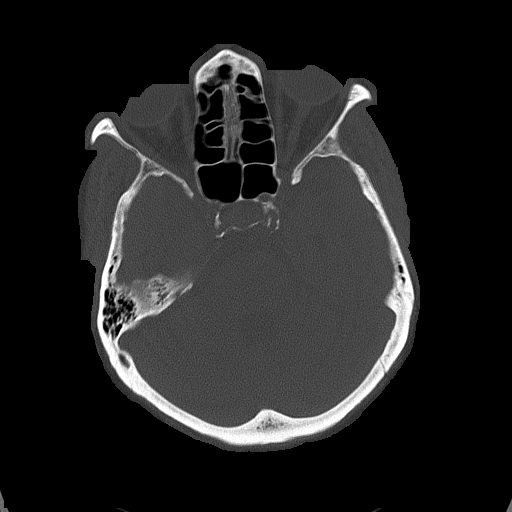
[im 36/80  bone]
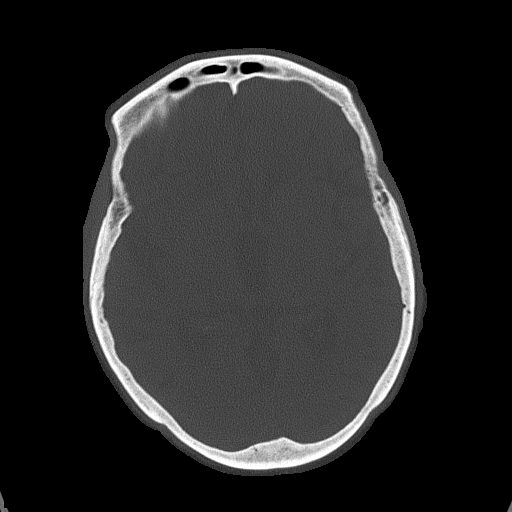

[Series 5: head without cor · coronal · non-contrast · 0.29mm/px · 3 of 67 slices shown]
[im 23/67  brain]
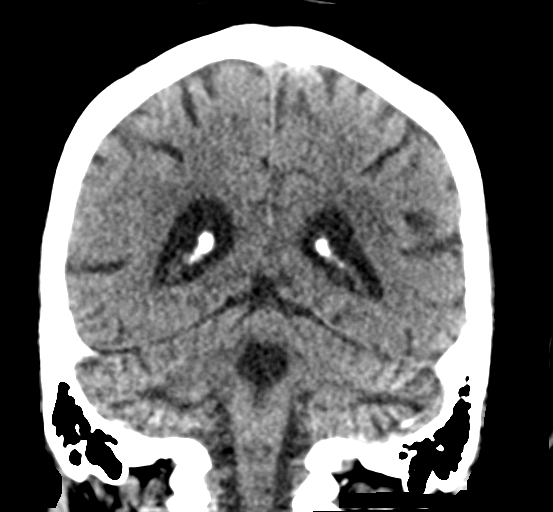
[im 30/67  brain]
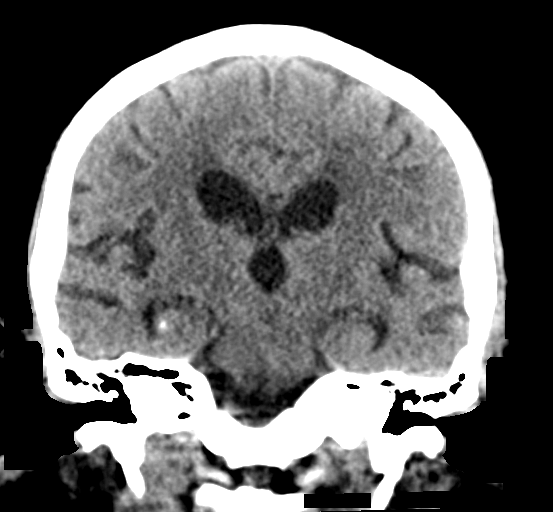
[im 37/67  brain]
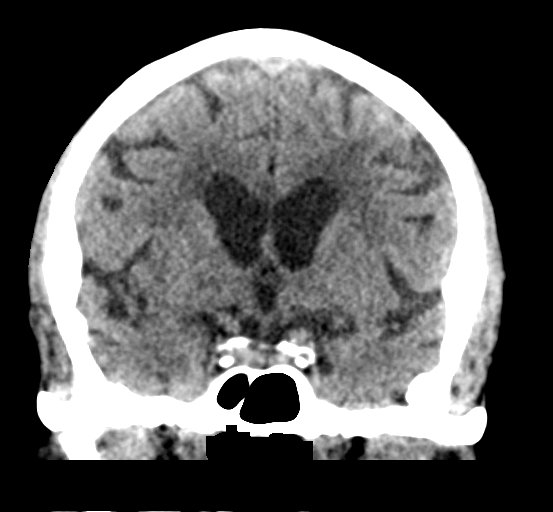

[Series 6: head without sag · sagittal · non-contrast · 0.30mm/px · 3 of 53 slices shown]
[im 18/53  brain]
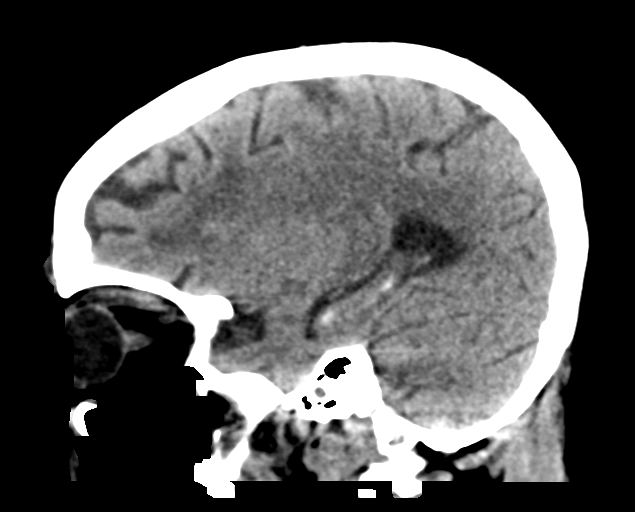
[im 27/53  brain]
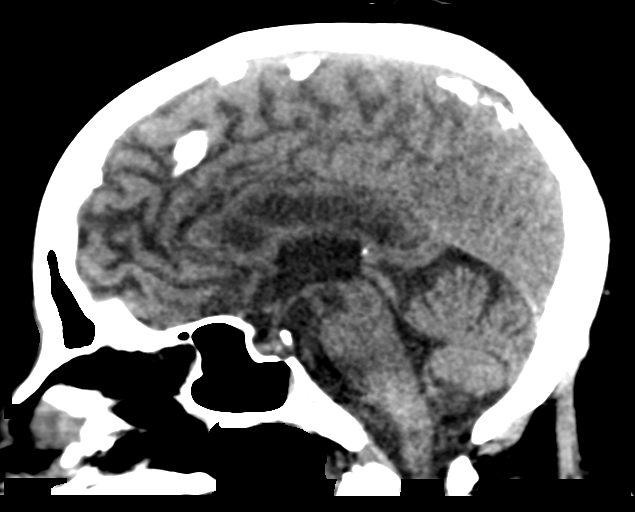
[im 35/53  brain]
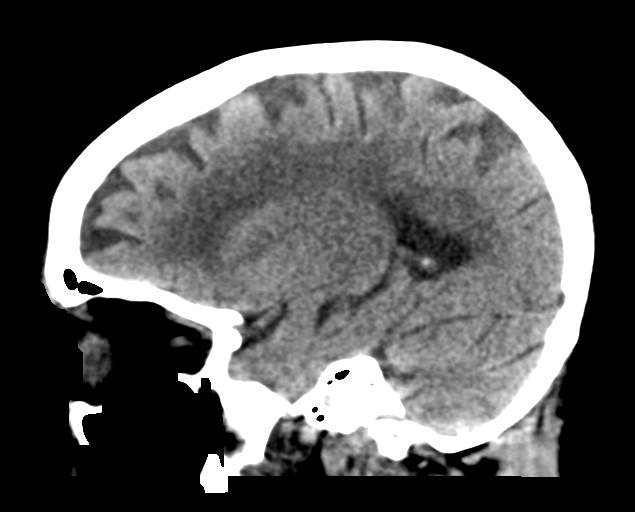

[17 of 47 positions shown; findings below may reference images not displayed]

FINDINGS: Brain: Stable degree of atrophy and chronic small vessel ischemia.
No intracranial hemorrhage, mass effect, or midline shift. No
hydrocephalus. The basilar cisterns are patent. No evidence of
territorial infarct or acute ischemia. No extra-axial or
intracranial fluid collection.

Vascular: Atherosclerosis of skullbase vasculature without
hyperdense vessel or abnormal calcification.

Skull: No fracture or focal lesion.

Sinuses/Orbits: Similar fluid level in left side of sphenoid sinus.
Mild mucosal thickening of maxillary sinuses. Mastoid air cells are
clear. No acute orbital abnormality.

Other: None.
IMPRESSION: 1. No acute intracranial abnormality.
2. Stable atrophy and chronic small vessel ischemia.

## 2021-06-03 IMAGING — DX DG CHEST 1V PORT
1 series · 1 of 1 positions shown · non-contrast
Comparison: One week ago

CLINICAL DATA: Altered mental status

EXAM:
PORTABLE CHEST 1 VIEW

[chest ap]
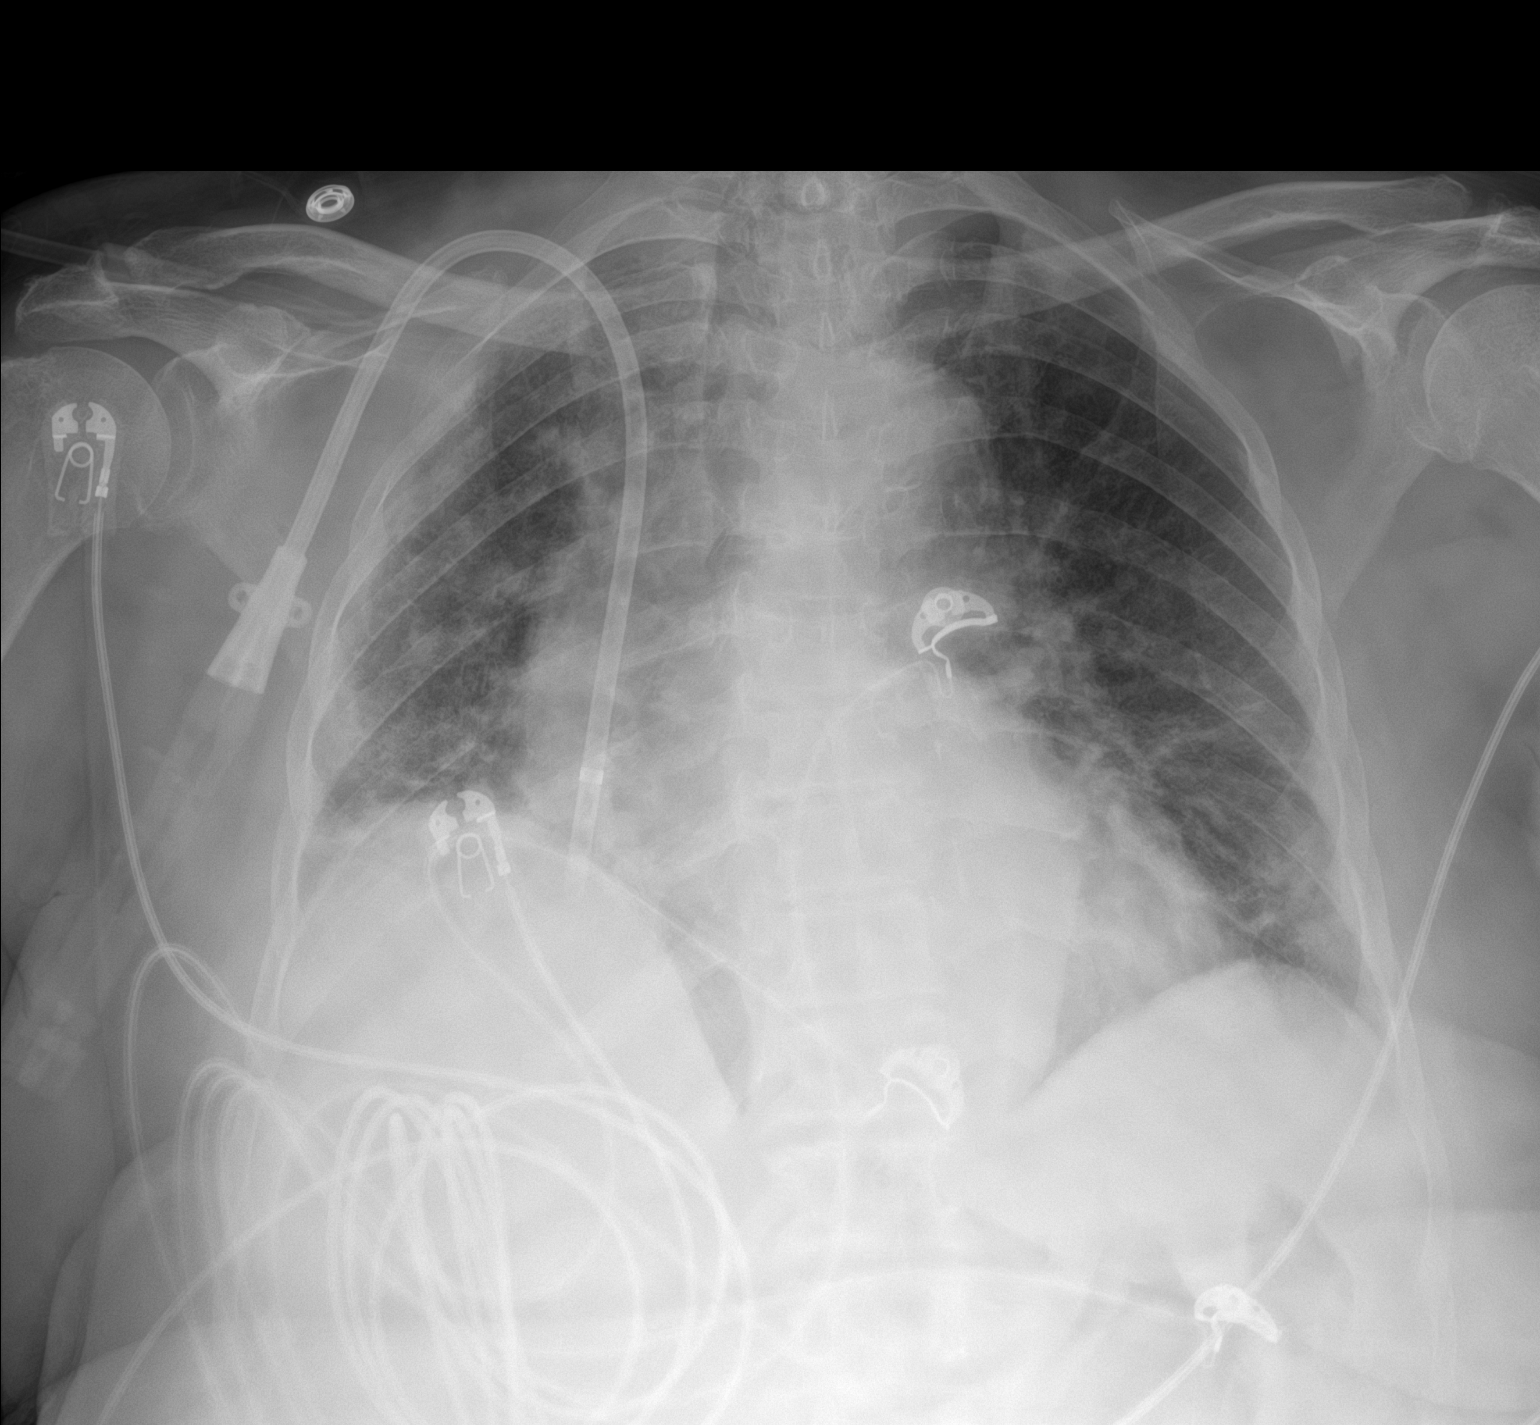

[1 of 1 positions shown; findings below may reference images not displayed]

FINDINGS: Cardiomegaly and vascular pedicle widening. Subpleural predominant
airspace opacity greater to the right where there are lower lung
volumes. Dialysis catheter with tip at the right atrium. Artifact
from EKG leads
IMPRESSION: Patchy bilateral pneumonia similar to 1 week prior.

## 2021-06-23 IMAGING — CT CT HEAD W/O CM
6 of 12 series · 18 of 47 positions shown, 19 images · non-contrast
Comparison: 03/09/2020

CLINICAL DATA: Altered mental status, delirium

EXAM:
CT HEAD WITHOUT CONTRAST
TECHNIQUE: Contiguous axial images were obtained from the base of the skull
through the vertex without intravenous contrast.

[Series 3: head without · axial · non-contrast · 0.46mm/px · z∈[-198,-38]mm · 3 of 33 slices shown, 4 images]
[im 1/33  brain]
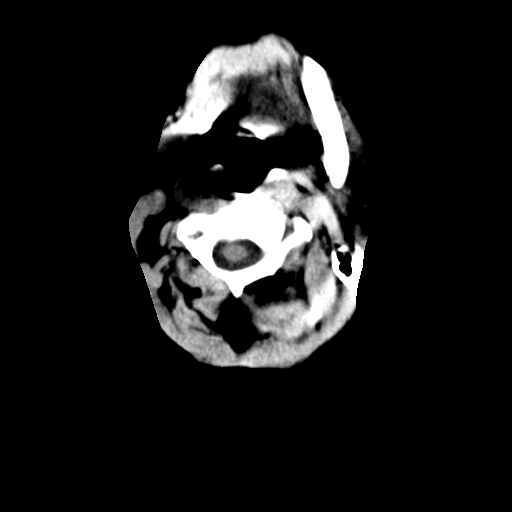
[im 1/33  bone]
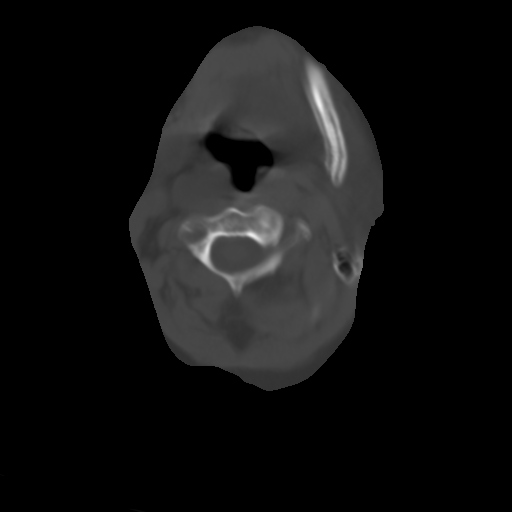
[im 17/33  brain]
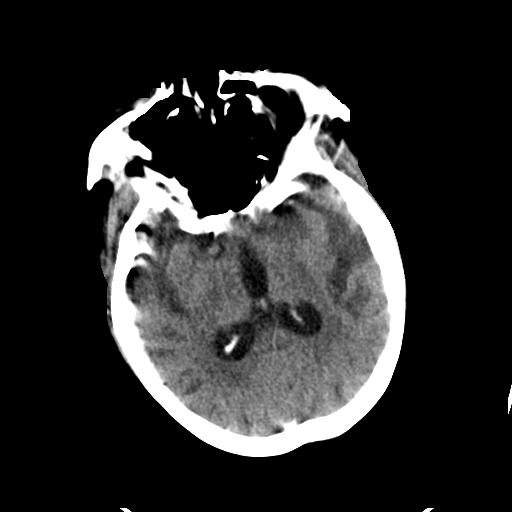
[im 33/33  brain]
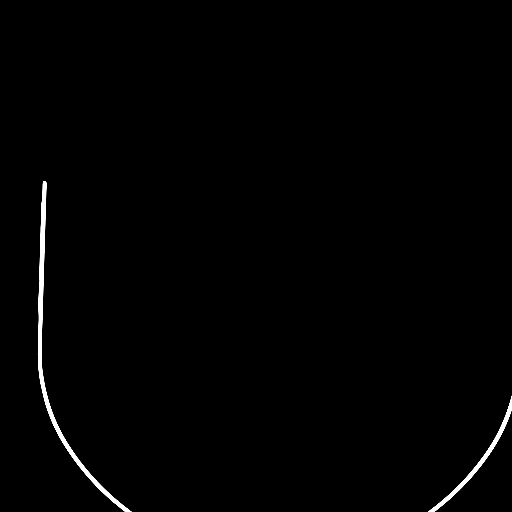

[Series 4: head bone · axial · 0.46mm/px · z∈[-172,-66]mm · 5 of 81 slices shown (1 of 3)]
[im 14/81  bone]
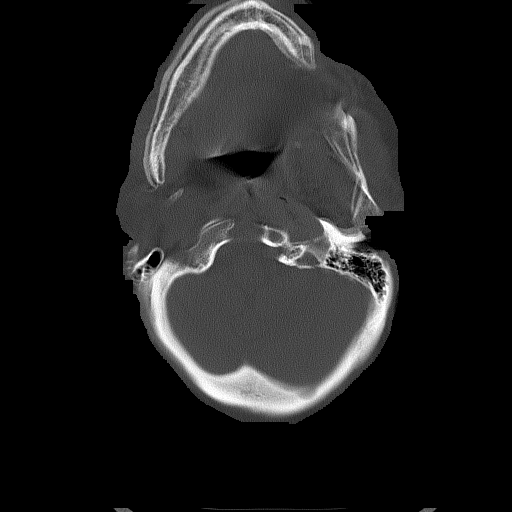
[im 27/81  bone]
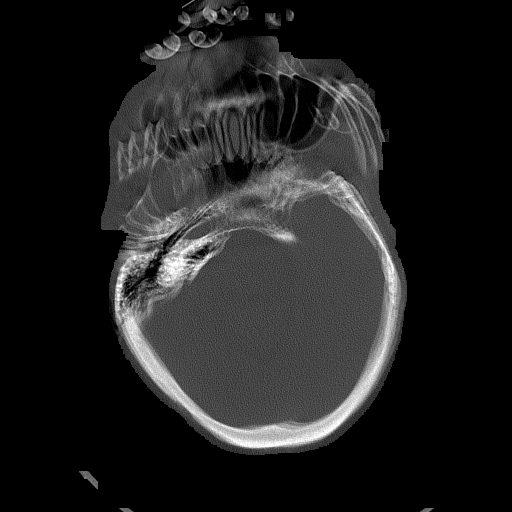
[im 41/81  bone]
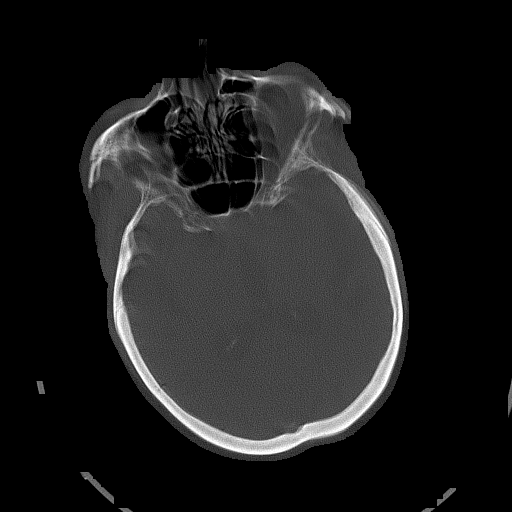
[im 54/81  bone]
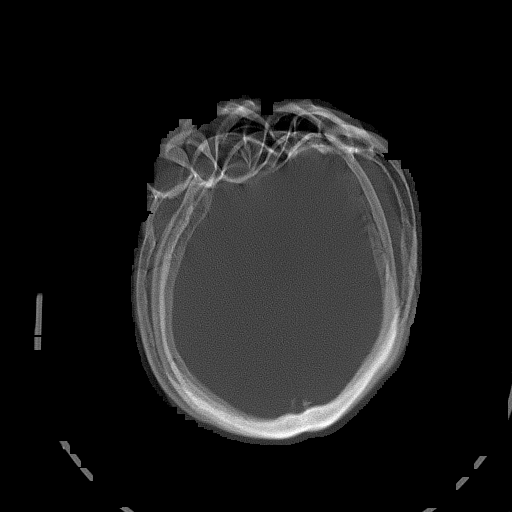
[im 67/81  bone]
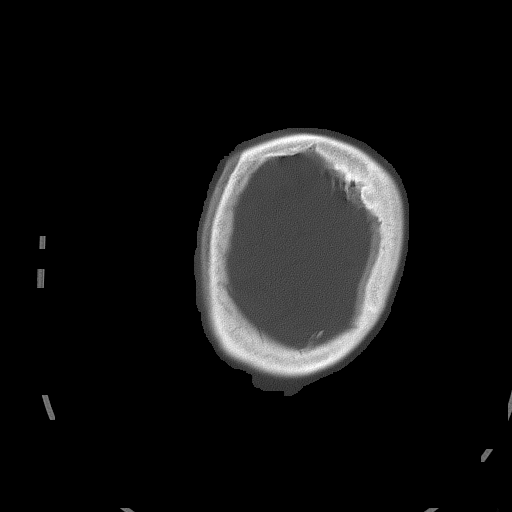

[Series 8: head bone · axial · 0.46mm/px · z∈[-172,-66]mm · 5 of 81 slices shown (2 of 3)]
[im 14/81  bone]
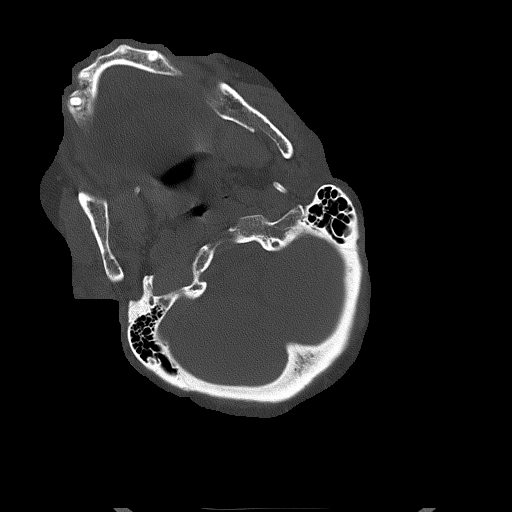
[im 27/81  bone]
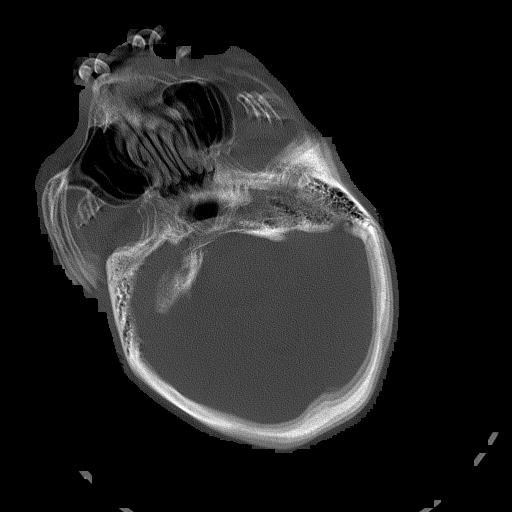
[im 41/81  bone]
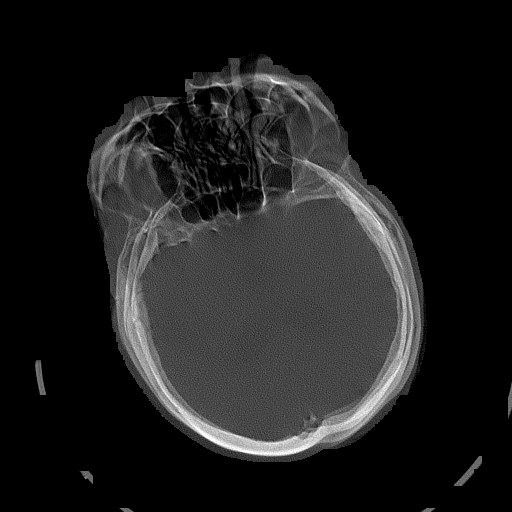
[im 54/81  bone]
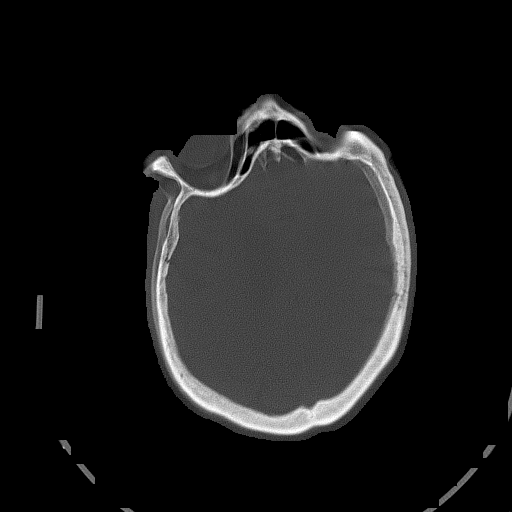
[im 67/81  bone]
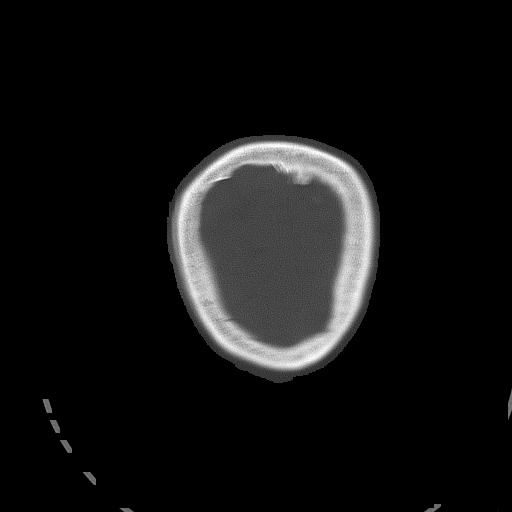

[Series 9: head without cor · coronal · non-contrast · 0.31mm/px · 2 of 67 slices shown]
[im 33/67  brain]
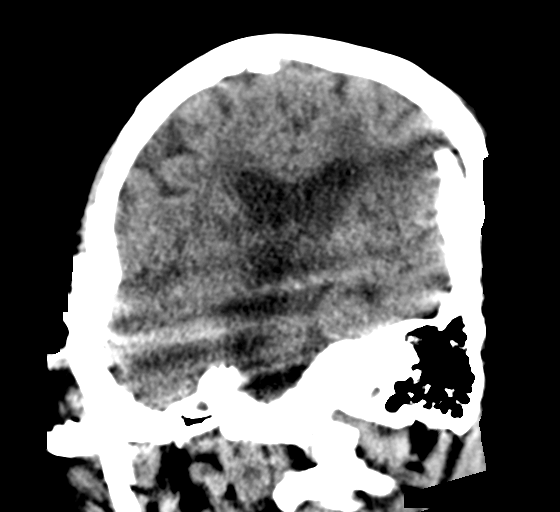
[im 66/67  brain]
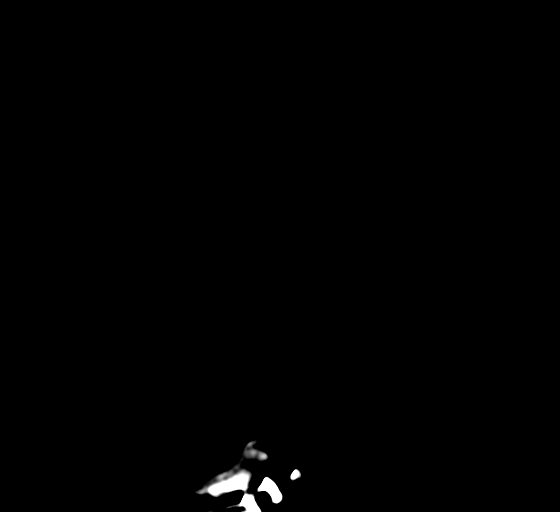

[Series 10: head without sag · sagittal · non-contrast · 0.36mm/px · 1 of 67 slices shown]
[im 34/67  brain]
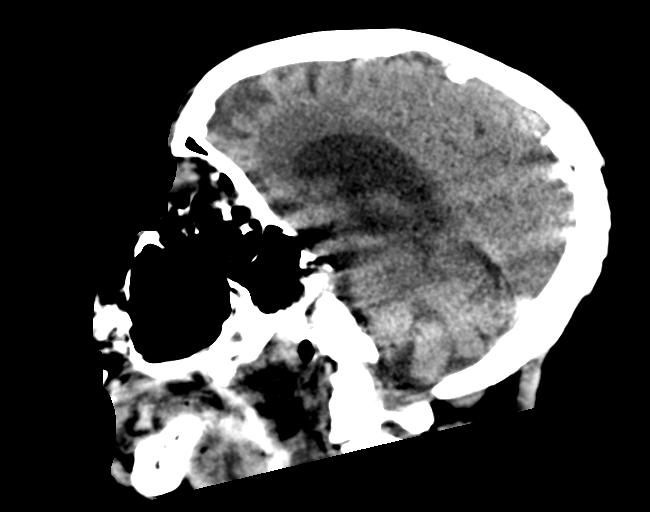

[Series 12: head bone · axial · 0.46mm/px · z∈[-172,-146]mm · 2 of 81 slices shown (3 of 3)]
[im 14/81  bone]
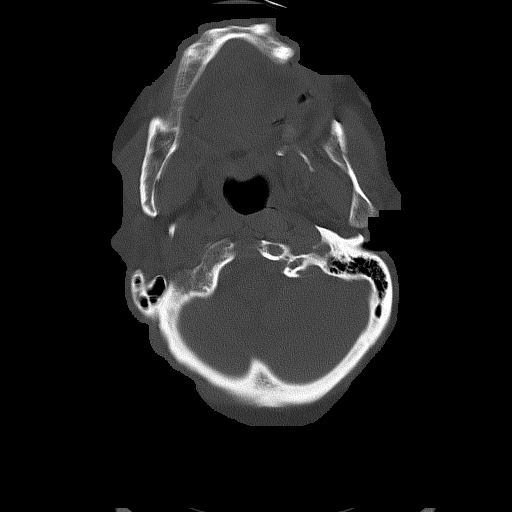
[im 27/81  bone]
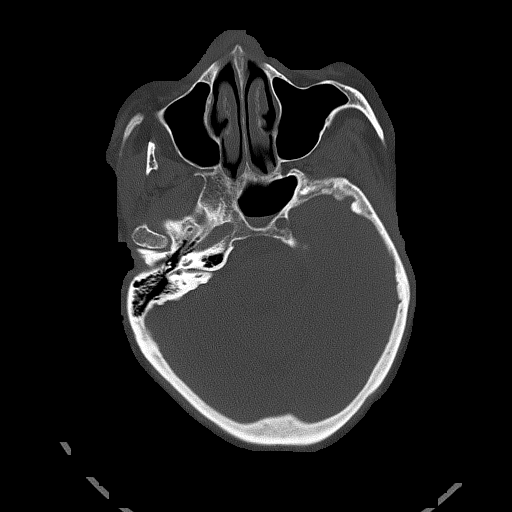

[18 of 47 positions shown; findings below may reference images not displayed]

FINDINGS: Technical note: Examination is severely limited by patient motion
artifact. Patient was uncooperative/combative. Study was repeated
multiple times. Best possible images were submitted.

Brain: No evidence of acute large territory infarct, intracranial
hemorrhage, or extra-axial collection on limited view.

Vascular: Limited.

Skull: No evidence of depressed calvarial fracture.

Sinuses/Orbits: Grossly clear.

Other: None.
IMPRESSION: 1. Significantly limited, essentially nondiagnostic, study secondary
to patient motion artifact. A repeat study should be performed when
patient is cooperative and able to tolerate scan.
2. No gross acute intracranial abnormality evident within the
limitations.

## 2021-06-24 IMAGING — CT CT HEAD W/O CM
4 series · 16 of 47 positions shown, 18 images · non-contrast
Comparison: [DATE] [DATE], [DATE]; [DATE] [DATE], [DATE].

CLINICAL DATA: Delirium/altered mental status

EXAM:
CT HEAD WITHOUT CONTRAST
TECHNIQUE: Contiguous axial images were obtained from the base of the skull
through the vertex without intravenous contrast.

[Series 3: head without · axial · non-contrast · 0.42mm/px · z∈[-72,+33]mm · 6 of 31 slices shown, 8 images]
[im 5/31  brain]
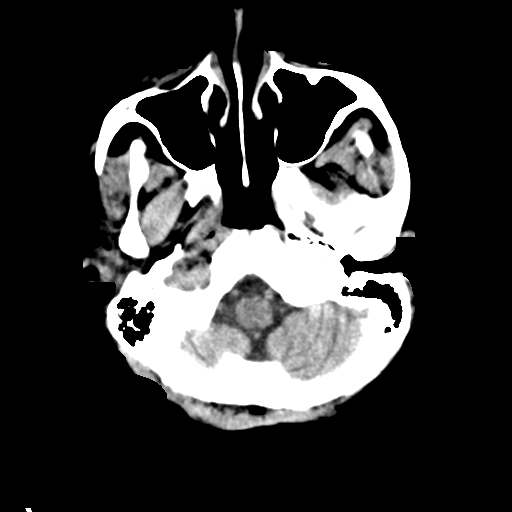
[im 5/31  bone]
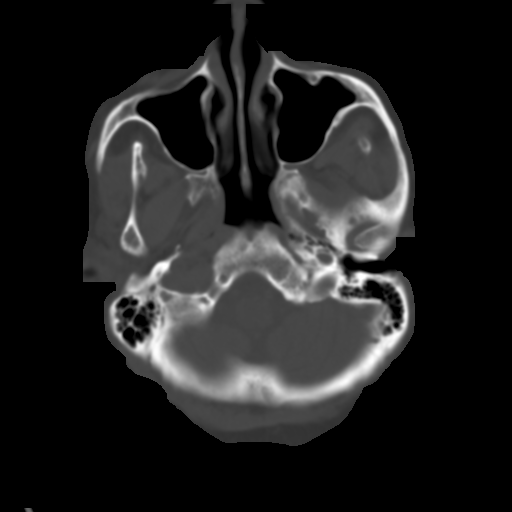
[im 9/31  brain]
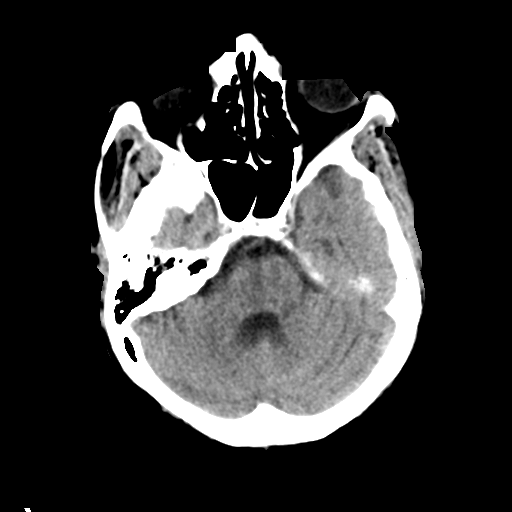
[im 13/31  brain]
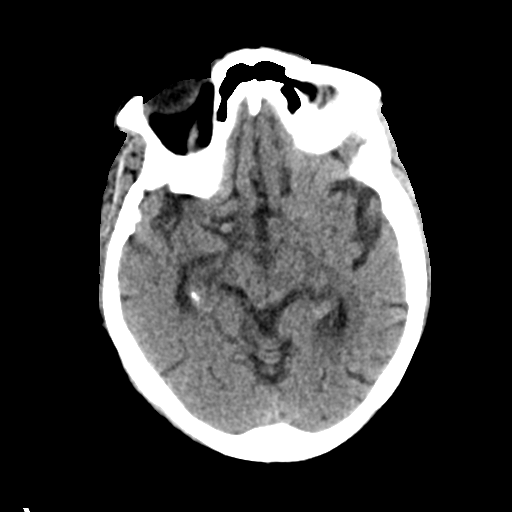
[im 18/31  brain]
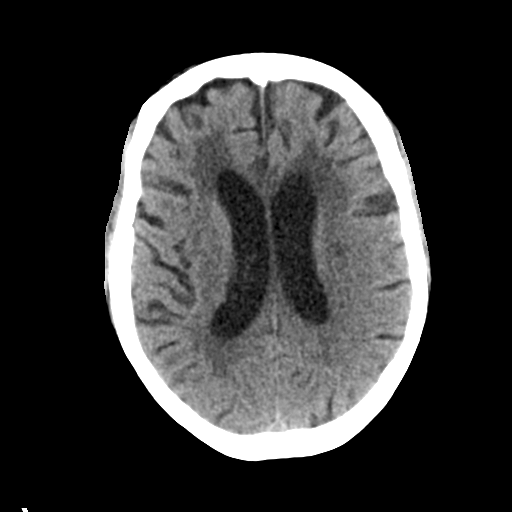
[im 22/31  brain]
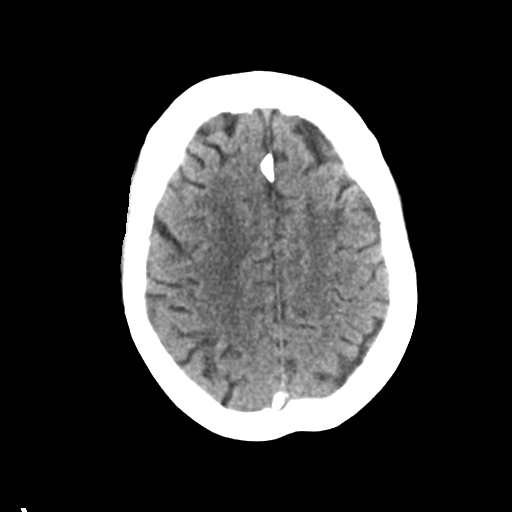
[im 22/31  bone]
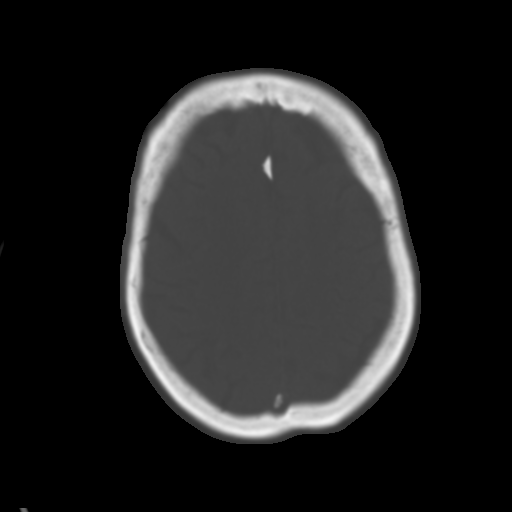
[im 26/31  brain]
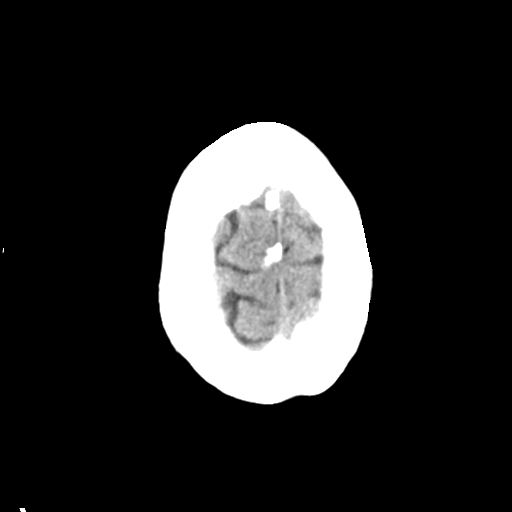

[Series 4: head bone · axial · 0.42mm/px · z∈[-78,-26]mm · 4 of 79 slices shown]
[im 8/79  bone]
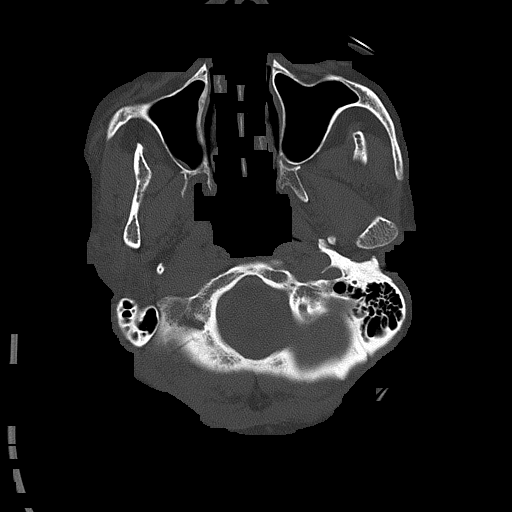
[im 15/79  bone]
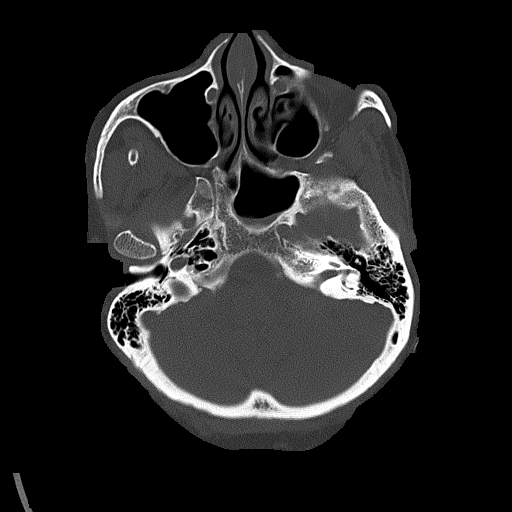
[im 27/79  bone]
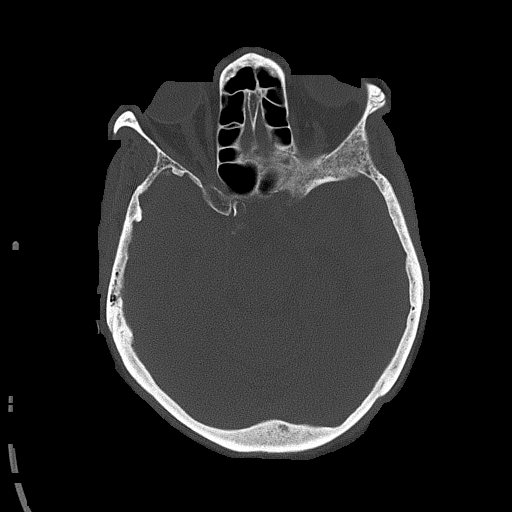
[im 34/79  bone]
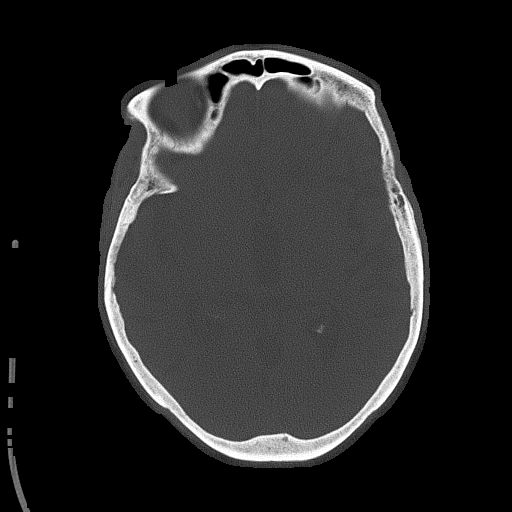

[Series 5: head without cor · coronal · non-contrast · 0.31mm/px · 3 of 62 slices shown]
[im 21/62  brain]
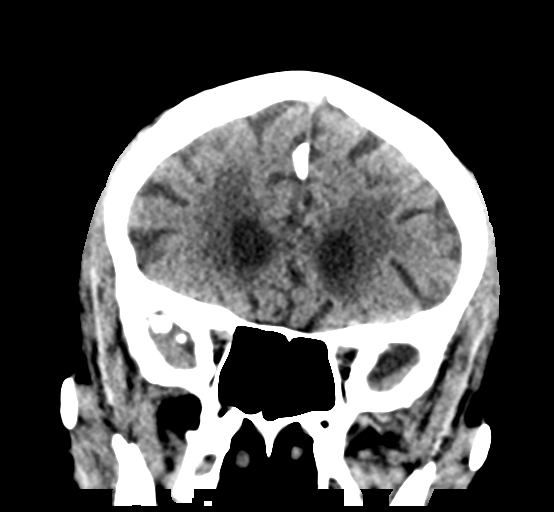
[im 28/62  brain]
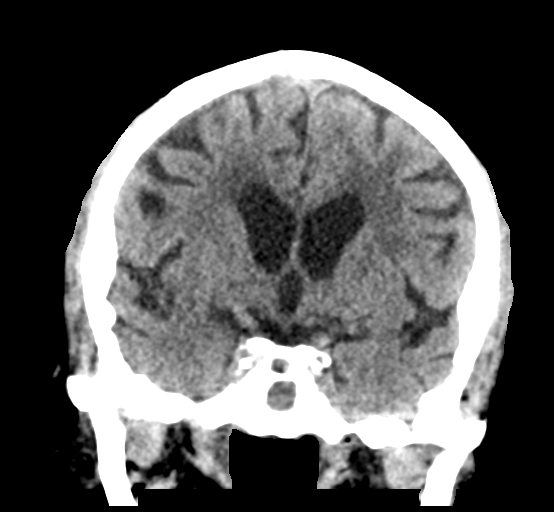
[im 34/62  brain]
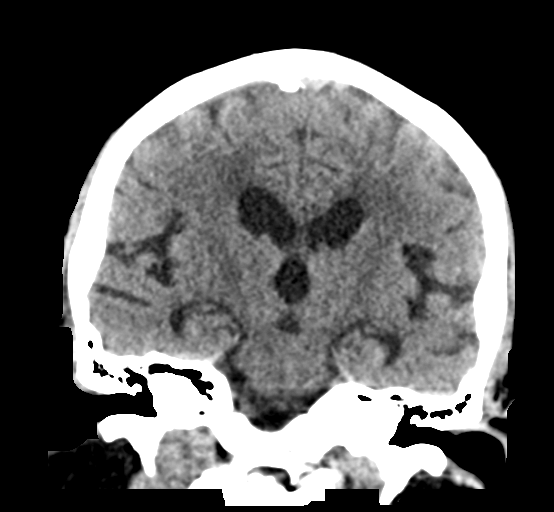

[Series 6: head without sag · sagittal · non-contrast · 0.31mm/px · 3 of 51 slices shown]
[im 17/51  brain]
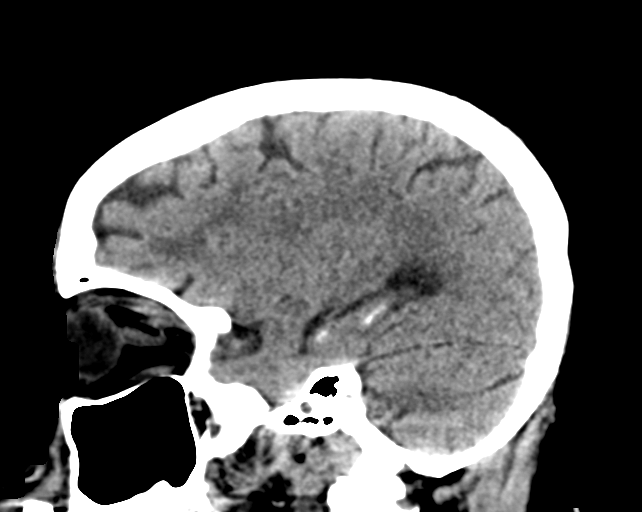
[im 26/51  brain]
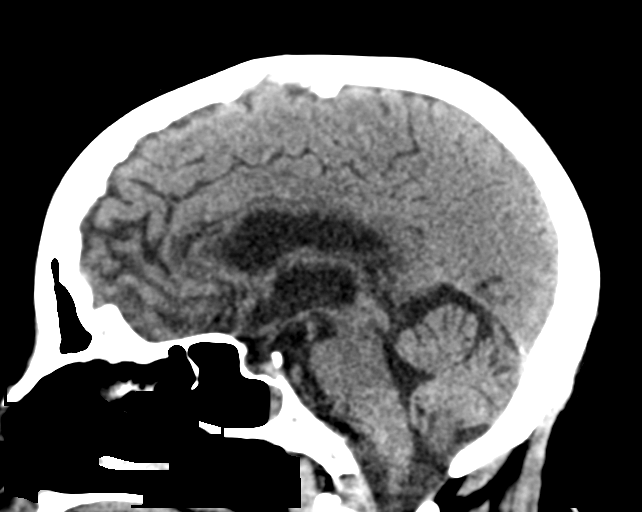
[im 34/51  brain]
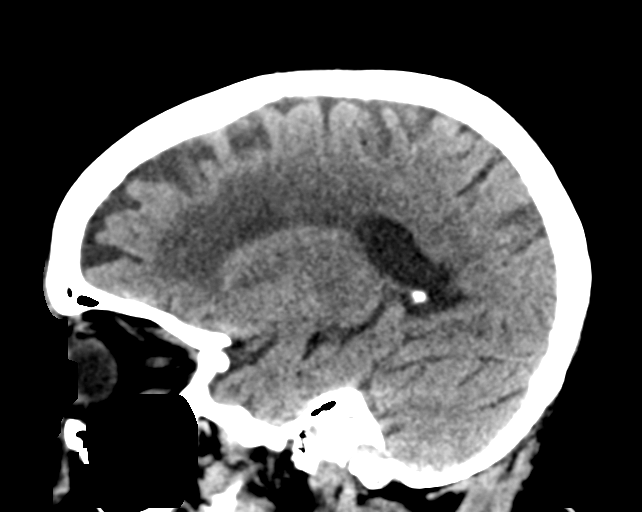

[16 of 47 positions shown; findings below may reference images not displayed]

FINDINGS: Brain: Mild to moderate diffuse atrophy is stable. There is no
intracranial mass, hemorrhage, extra-axial fluid collection, or
midline shift. There is patchy decreased attenuation throughout the
centra semiovale bilaterally, stable. There is evidence of a prior
small infarct in the mid left cerebellum laterally, stable. No acute
infarct is evident.

Vascular: No appreciable hyperdense vessel. There is calcification
in each carotid siphon region.

Skull: Bony calvarium appears intact.

Sinuses/Orbits: There is an air-fluid level in the left sphenoid
sinus. There is mucosal thickening in several ethmoid air cells.
Mild mucosal thickening is noted in the inferior right maxillary
antrum. Patient is status post cataract removal on the left. Orbits
otherwise appear symmetric.

Other: Mastoid air cells clear.
IMPRESSION: Stable atrophy with periventricular small vessel disease. Prior
small infarct left cerebellum. No acute infarct. No mass or
hemorrhage.

There are foci of arterial vascular calcification. There are foci of
paranasal sinus disease. Note air-fluid level in the left sphenoid
sinus.

## 2021-07-05 IMAGING — MR MR HEAD W/O CM
7 of 11 series · 25 of 48 positions shown · non-contrast
Comparison: Brain MRI 03/31/2020

CLINICAL DATA: Delirium

EXAM:
MRI HEAD WITHOUT CONTRAST
TECHNIQUE: Multiplanar, multiecho pulse sequences of the brain and surrounding
structures were obtained without intravenous contrast.

[Series 2: DWI · axial · 3.0mm · 0.94mm/px · z∈[-122,+24]mm · 7 of 100 slices shown (1 of 2)]
[im 1/100]
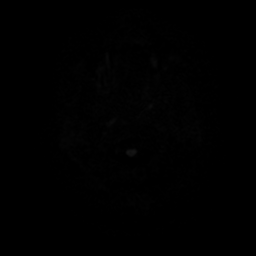
[im 17/100]
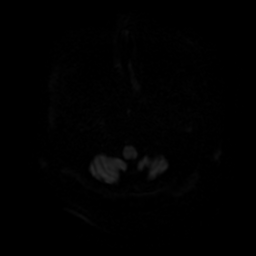
[im 34/100]
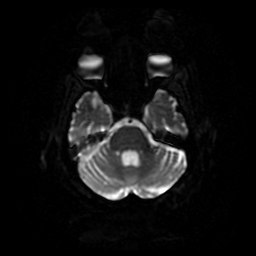
[im 50/100]
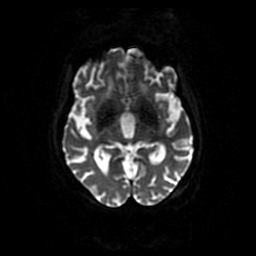
[im 67/100]
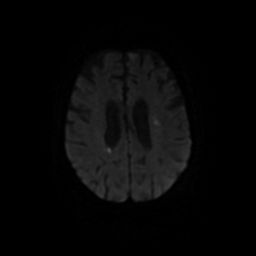
[im 83/100]
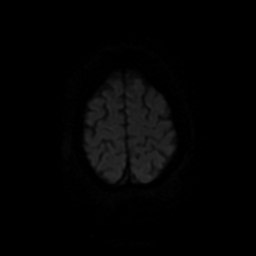
[im 100/100]
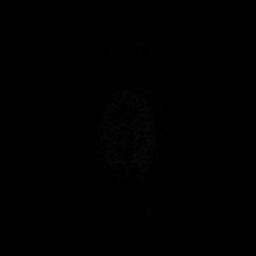

[Series 3: DWI · coronal · 4.0mm · 0.94mm/px · 6 of 74 slices shown (2 of 2)]
[im 1/74]
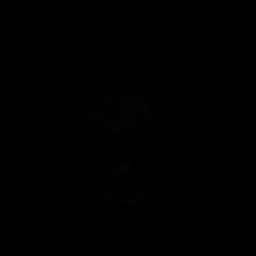
[im 15/74]
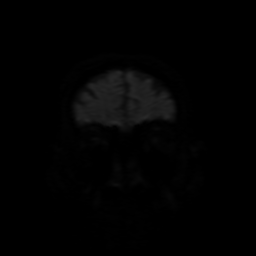
[im 30/74]
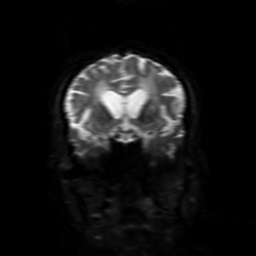
[im 44/74]
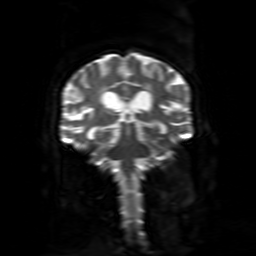
[im 59/74]
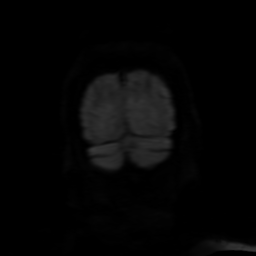
[im 74/74]
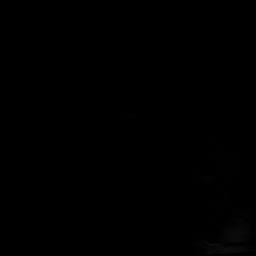

[Series 4: FLAIR · sagittal · 5.0mm · 0.23mm/px · 2 of 23 slices shown (1 of 2)]
[im 1/23]
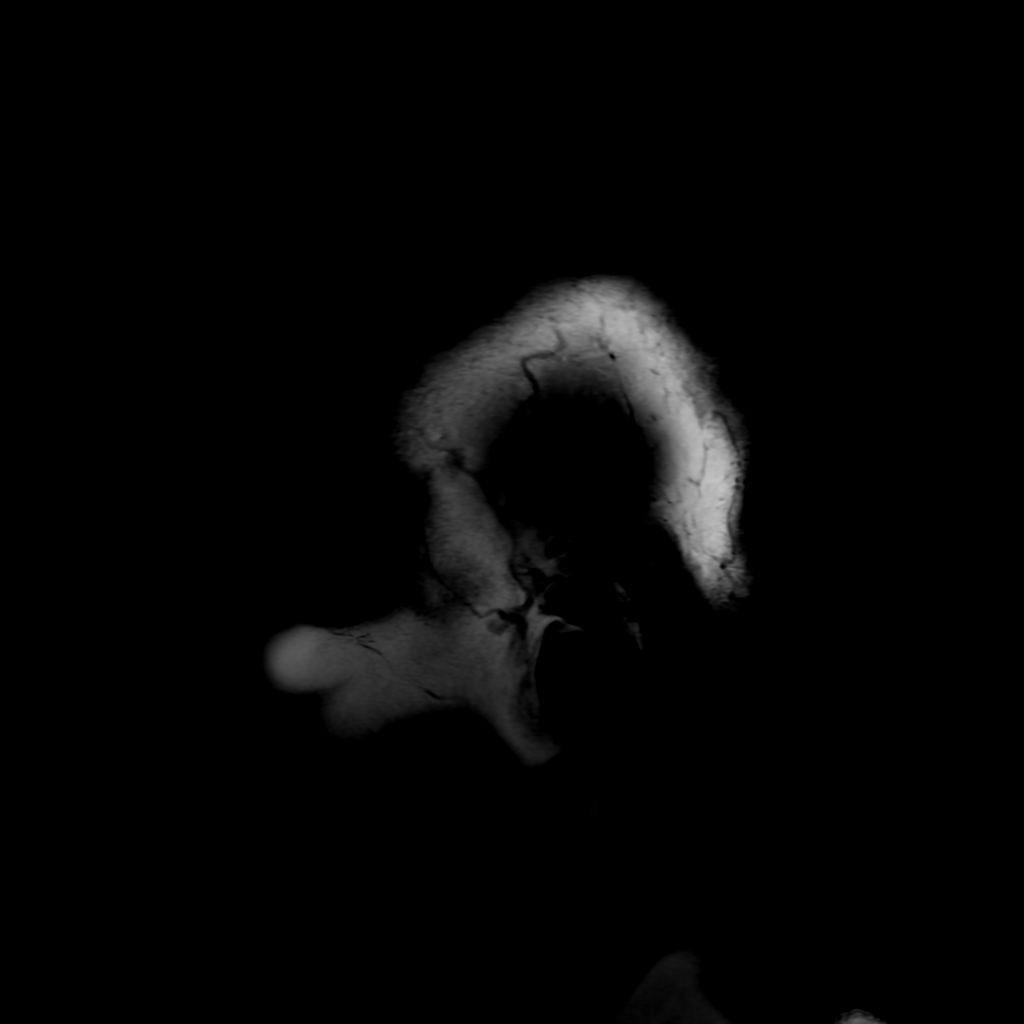
[im 23/23]
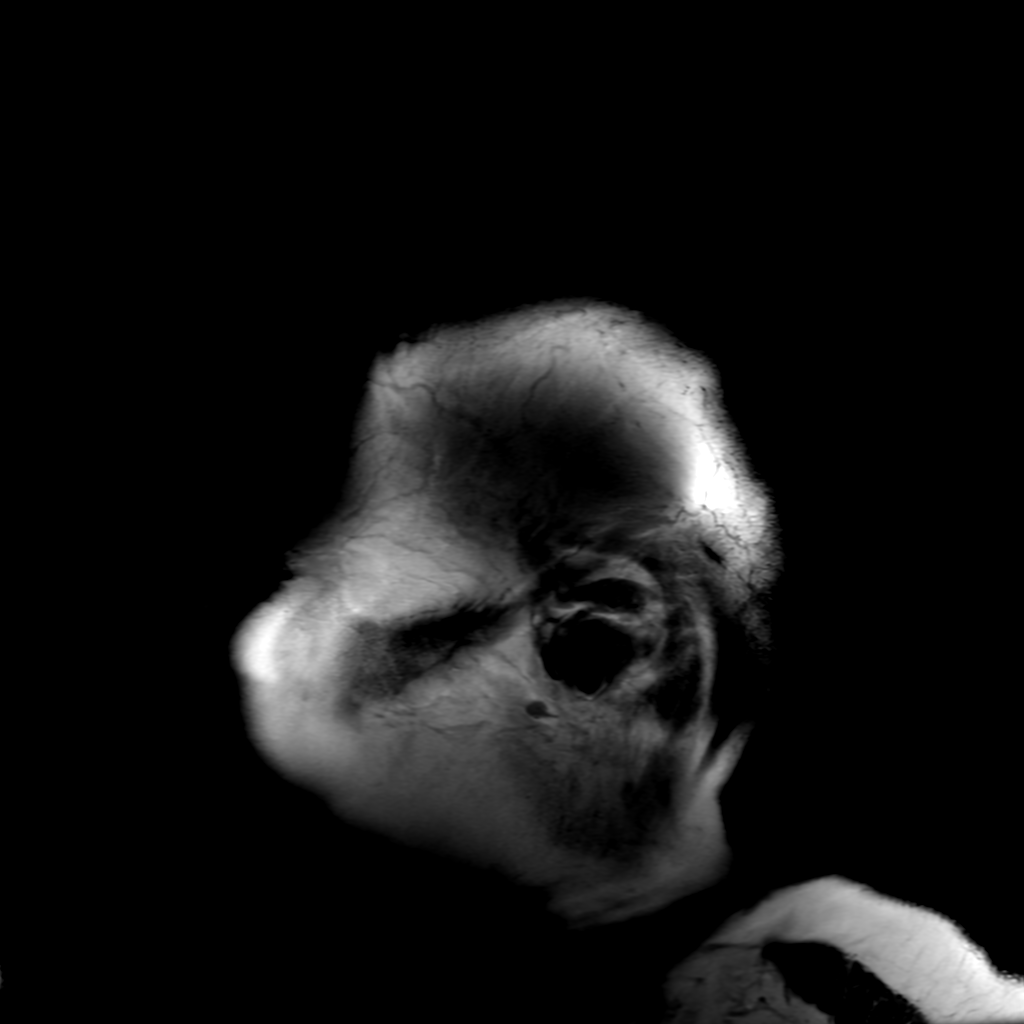

[Series 5: T2 · axial · 5.0mm · 0.23mm/px · 1 of 26 slices shown]
[im 1/26]
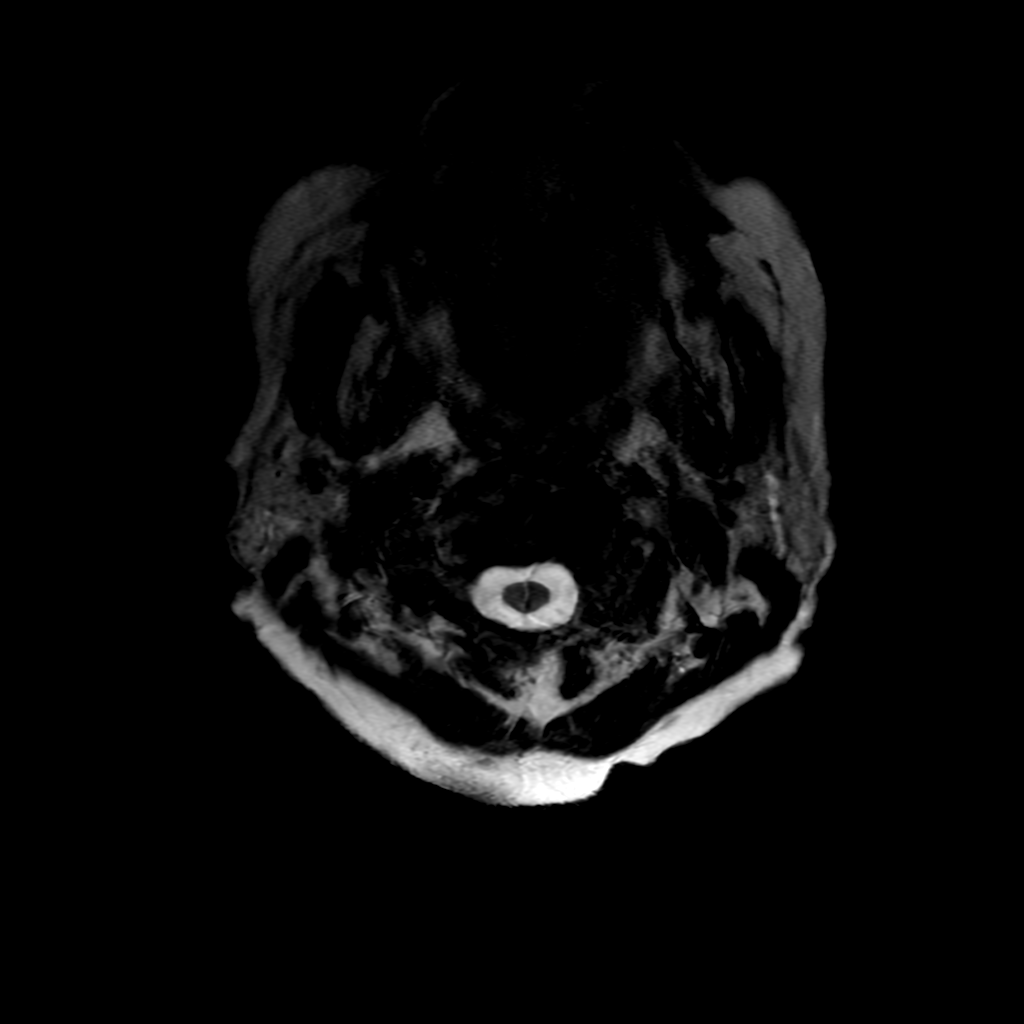

[Series 6: FLAIR · axial · 3.0mm · 0.45mm/px · z∈[-113,+16]mm · 2 of 23 slices shown (2 of 2)]
[im 1/23]
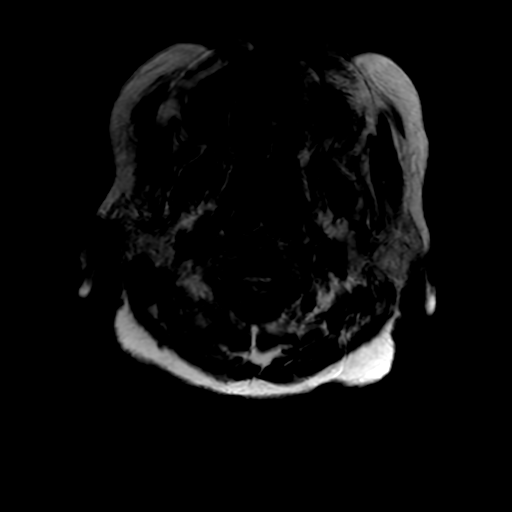
[im 23/23]
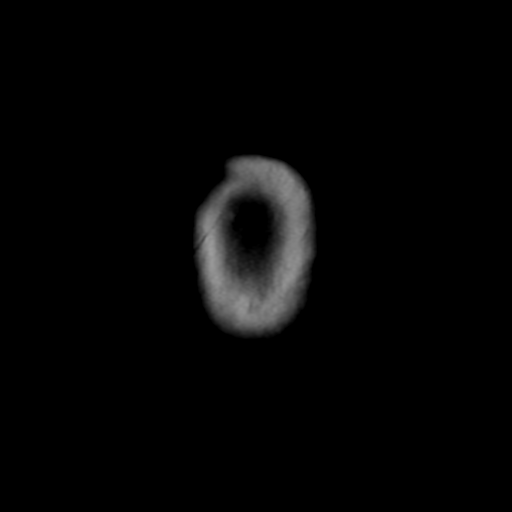

[Series 250: ADC · axial · 3.0mm · 0.94mm/px · z∈[-122,+24]mm · 4 of 50 slices shown (1 of 2)]
[im 1/50]
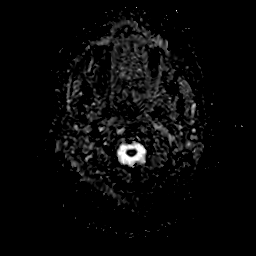
[im 17/50]
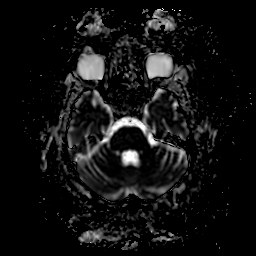
[im 33/50]
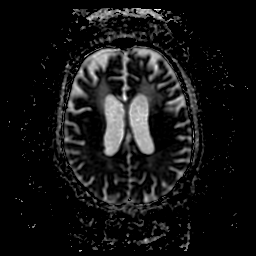
[im 50/50]
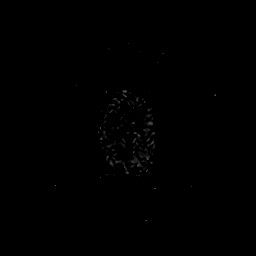

[Series 350: ADC · coronal · 4.0mm · 0.94mm/px · 3 of 37 slices shown (2 of 2)]
[im 1/37]
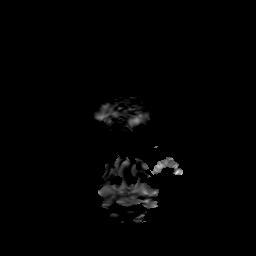
[im 19/37]
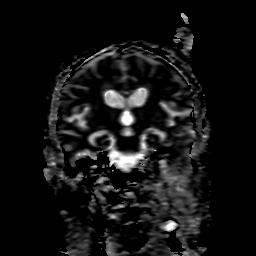
[im 37/37]
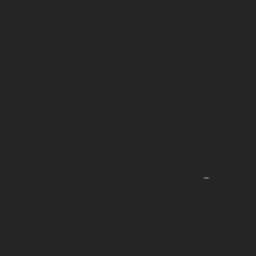

[25 of 48 positions shown; findings below may reference images not displayed]

FINDINGS: Brain: Unchanged multifocal bilateral abnormal diffusion
restriction. Unchanged 2-3 foci of chronic microhemorrhage. No acute
hemorrhage. Hyperintense T2-weighted signal is moderately widespread
throughout the white matter. Generalized volume loss without a clear
lobar predilection. The midline structures are normal. There is an
old left cerebellar infarct.

Vascular: Major flow voids are preserved.

Skull and upper cervical spine: Normal calvarium and skull base.
Visualized upper cervical spine and soft tissues are normal.

Sinuses/Orbits:No paranasal sinus fluid levels or advanced mucosal
thickening. No mastoid or middle ear effusion. Normal orbits.
IMPRESSION: 1. Unchanged multifocal bilateral subacute watershed infarcts. No
hemorrhage or mass effect.
2. Unchanged findings of chronic microvascular ischemia and volume
loss.

## 2021-07-05 IMAGING — CT CT HEAD W/O CM
3 series · 15 of 47 positions shown, 18 images · non-contrast
Comparison: CT head 03/30/2020

CLINICAL DATA: Delirium, altered mental status.

EXAM:
CT HEAD WITHOUT CONTRAST
TECHNIQUE: Contiguous axial images were obtained from the base of the skull
through the vertex without intravenous contrast.

[Series 3: head 5.0 h30s · axial · 0.42mm/px · z∈[-75,+50]mm · 9 of 30 slices shown, 12 images]
[im 3/30  brain]
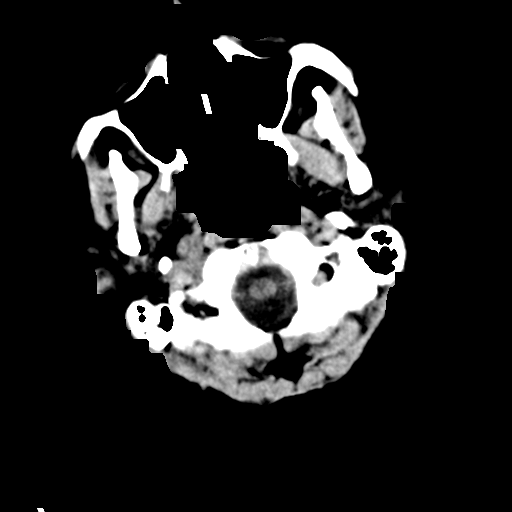
[im 3/30  bone]
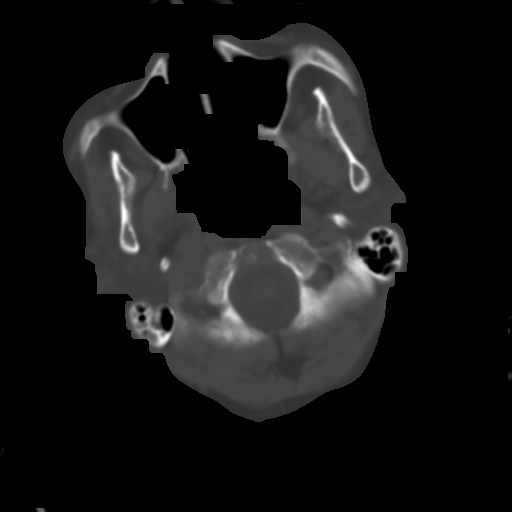
[im 6/30  brain]
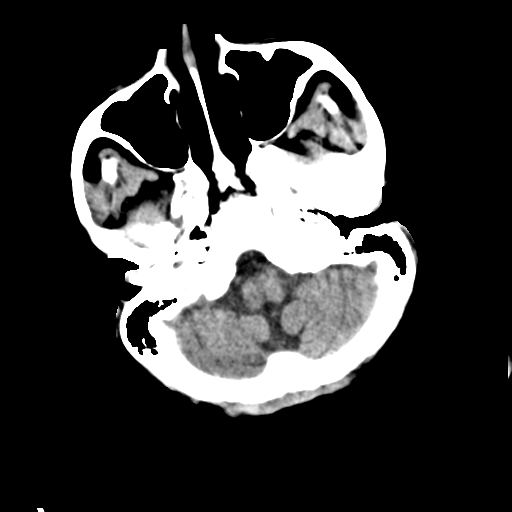
[im 9/30  brain]
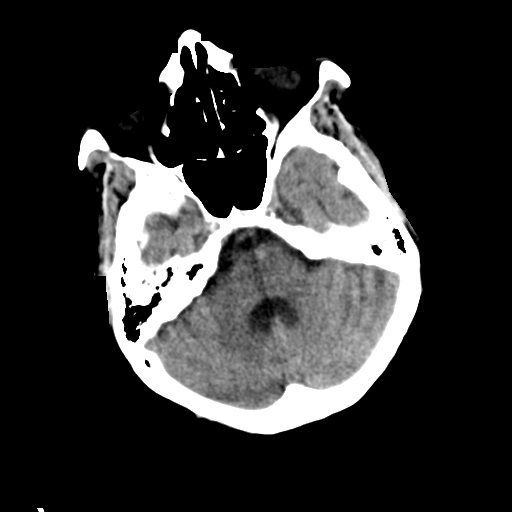
[im 12/30  brain]
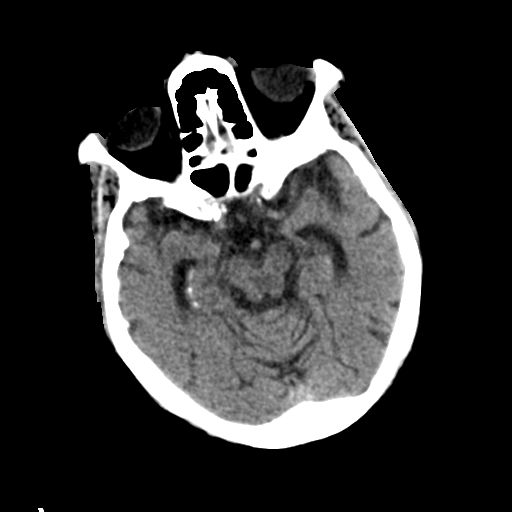
[im 16/30  brain]
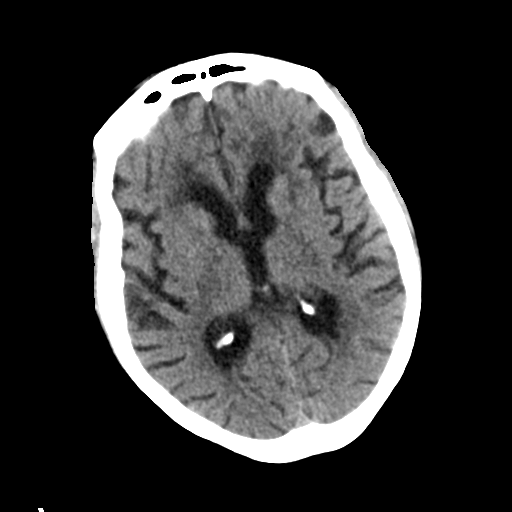
[im 16/30  bone]
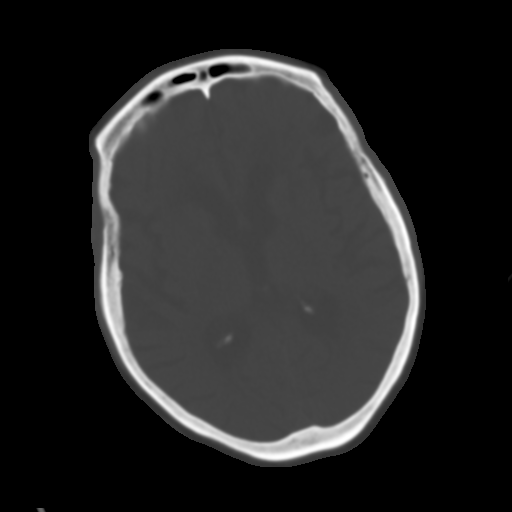
[im 19/30  brain]
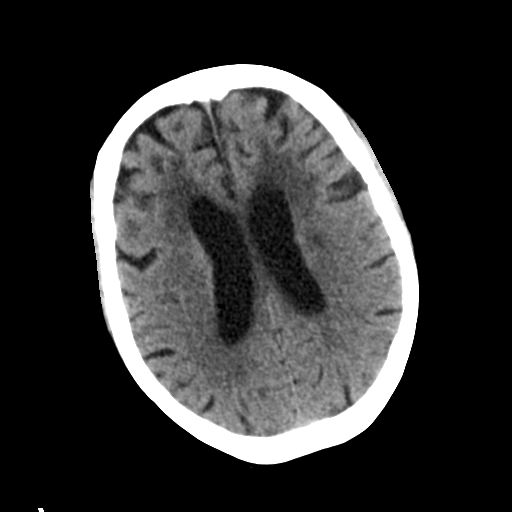
[im 22/30  brain]
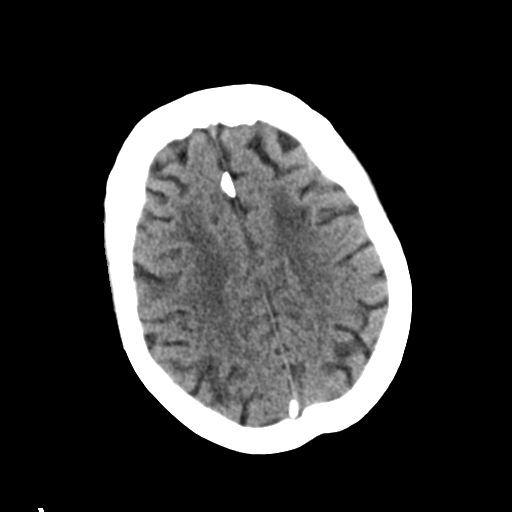
[im 25/30  brain]
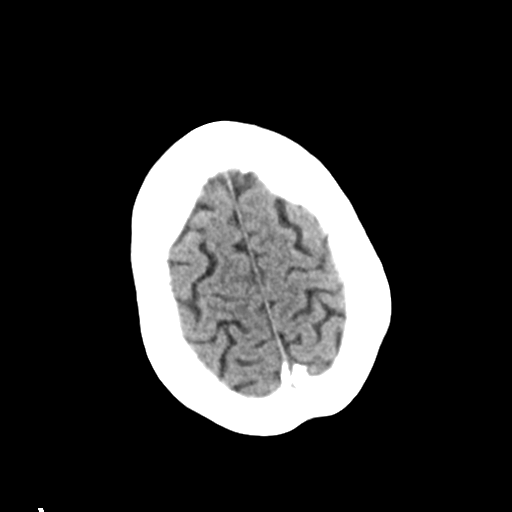
[im 28/30  brain]
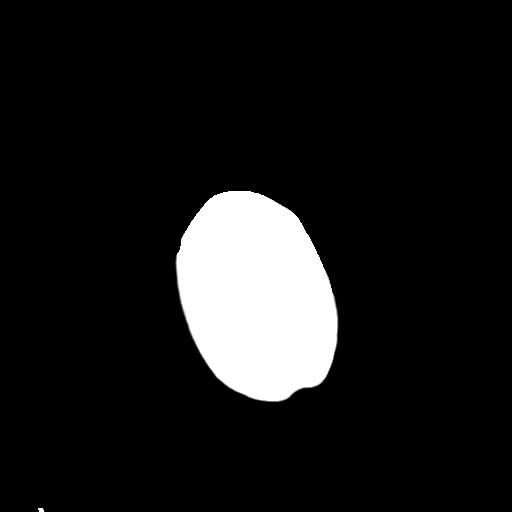
[im 28/30  bone]
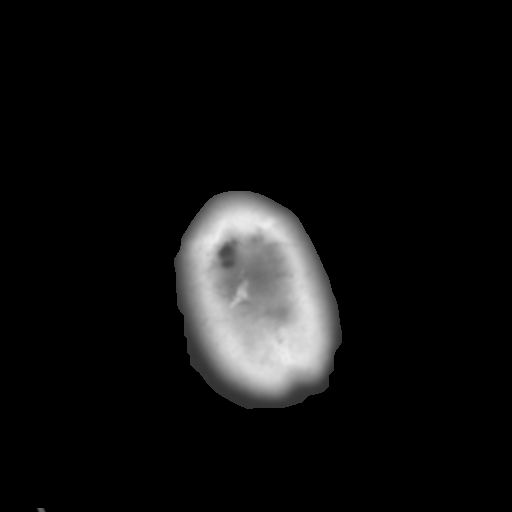

[Series 5: head 3.0 mpr cor · coronal · 0.29mm/px · 3 of 67 slices shown]
[im 23/67  brain]
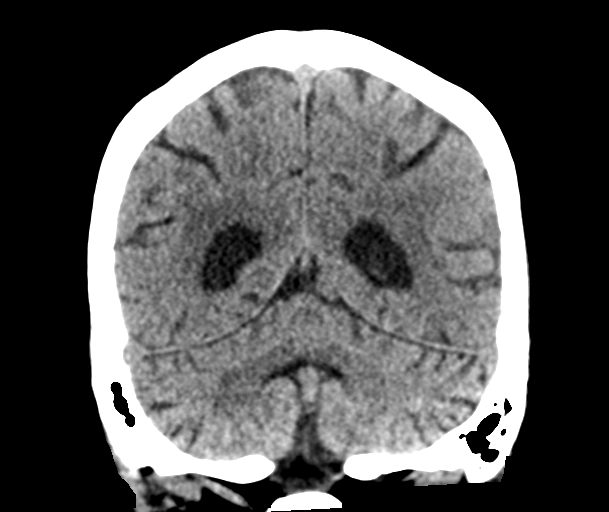
[im 30/67  brain]
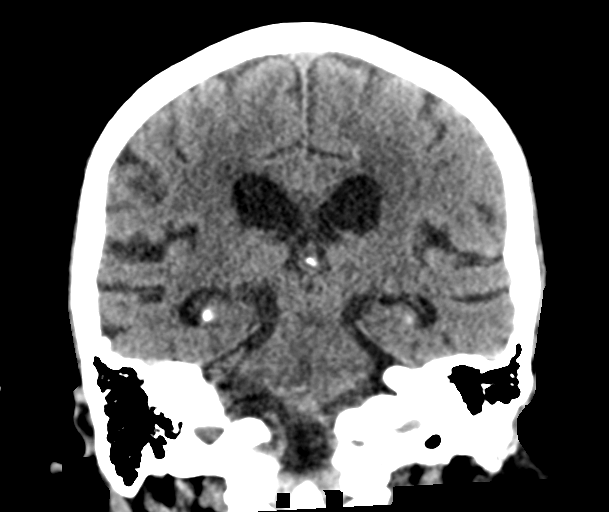
[im 37/67  brain]
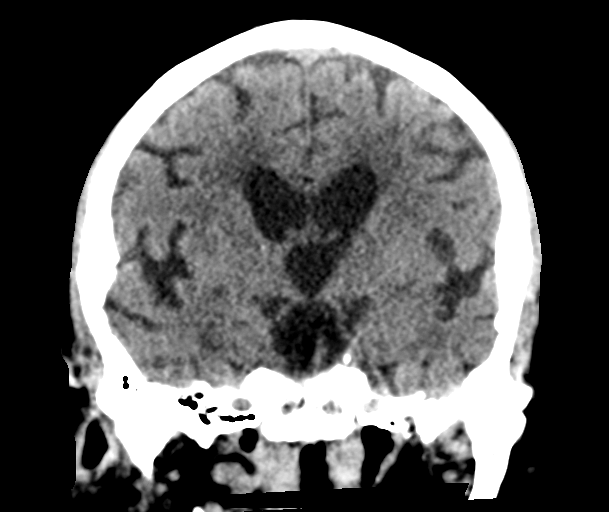

[Series 6: head 3.0 mpr sag · sagittal · 0.29mm/px · 3 of 66 slices shown]
[im 22/66  brain]
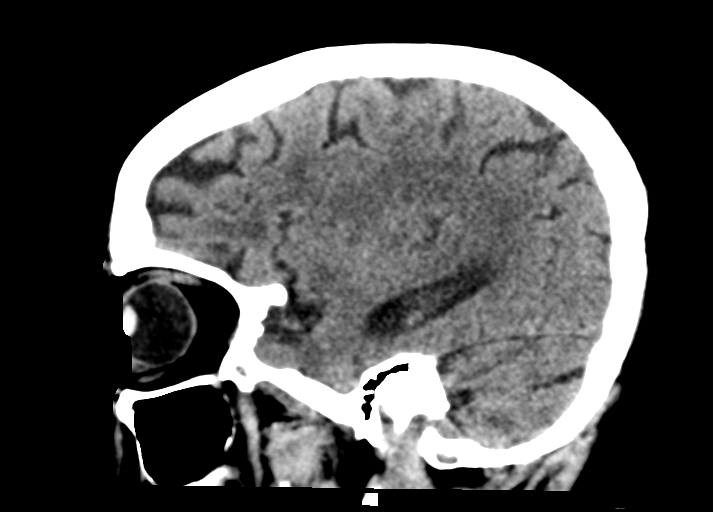
[im 33/66  brain]
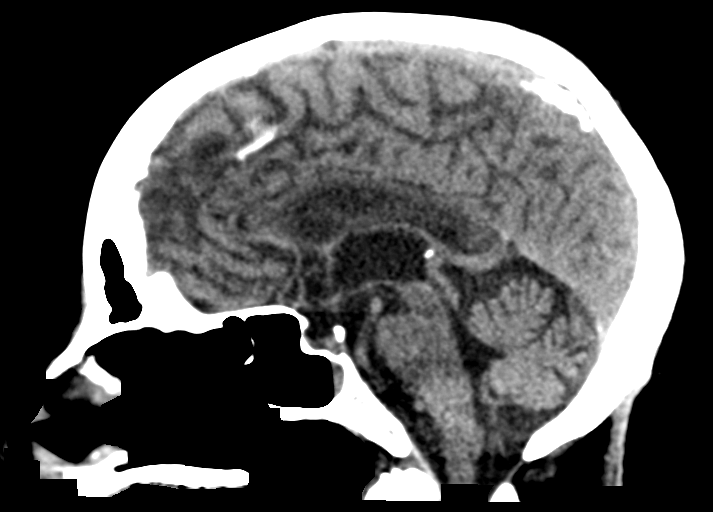
[im 44/66  brain]
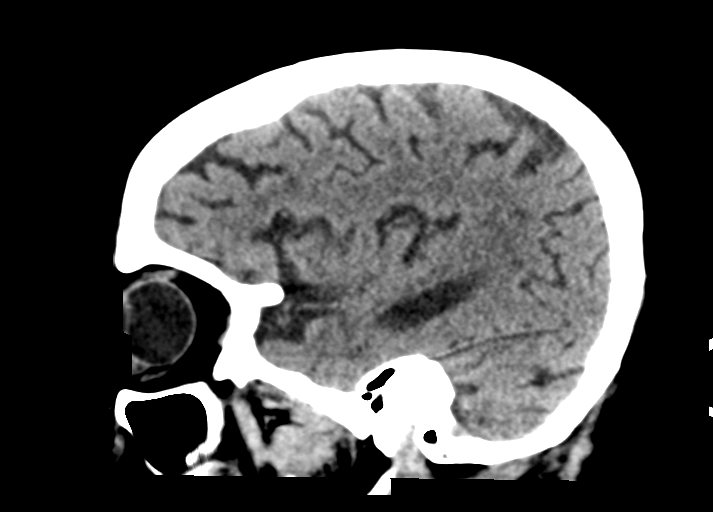

[15 of 47 positions shown; findings below may reference images not displayed]

FINDINGS: Brain:

Cerebral ventricle sizes are concordant with the degree of cerebral
volume loss.

Patchy and confluent areas of decreased attenuation are noted
throughout the deep and periventricular white matter of the cerebral
hemispheres bilaterally, compatible with chronic microvascular
ischemic disease. Chronic left cerebellar infarction.

No evidence of large-territorial acute infarction. No parenchymal
hemorrhage. No mass lesion. No extra-axial collection.

No mass effect or midline shift. No hydrocephalus. Basilar cisterns
are patent.

Vascular: No hyperdense vessel. Atherosclerotic calcifications are
present within the cavernous internal carotid arteries.

Skull: No acute fracture or focal lesion.

Sinuses/Orbits: Mucosal thickening of the left sphenoid sinus.
Otherwise visualized paranasal sinuses and mastoid air cells are
clear. The orbits are unremarkable.

Other: None.
IMPRESSION: No acute intracranial abnormality.

## 2021-07-05 IMAGING — DX DG CHEST 1V PORT
1 series · 1 of 1 positions shown · non-contrast
Comparison: 03/29/2020.

CLINICAL DATA: Weakness.

EXAM:
PORTABLE CHEST 1 VIEW

[chest ap]
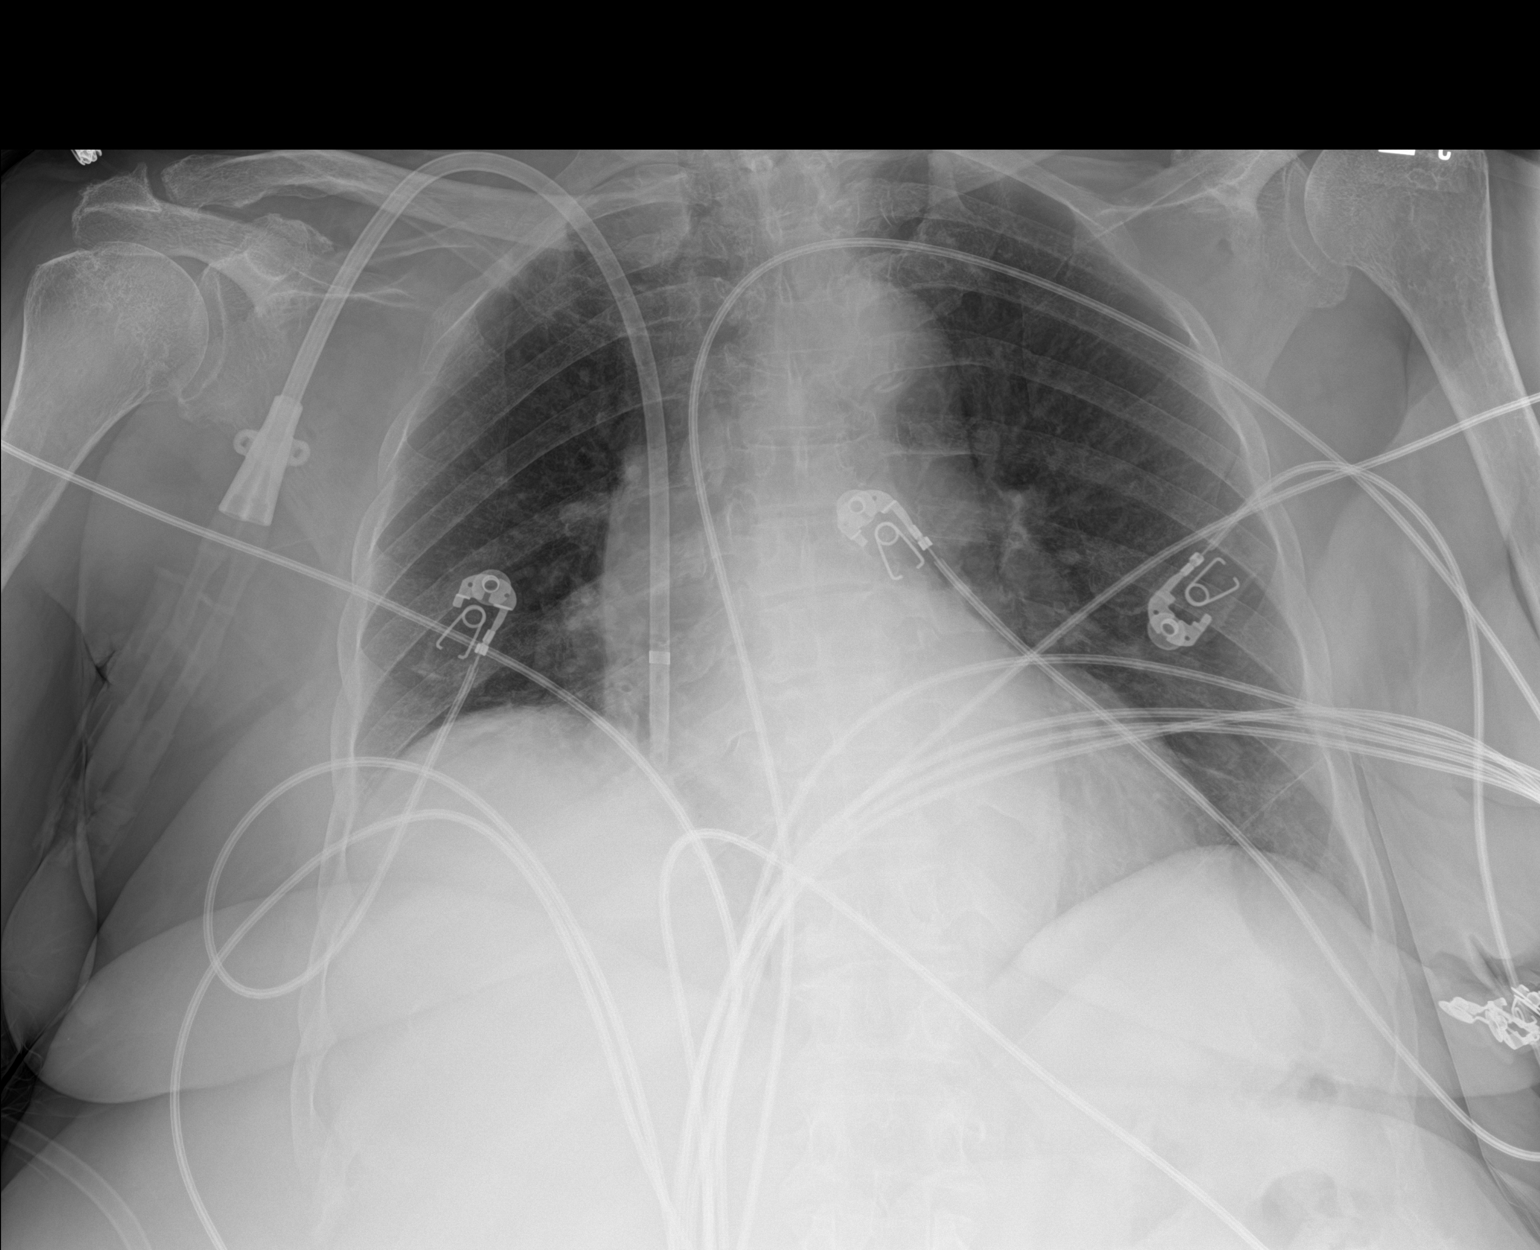

[1 of 1 positions shown; findings below may reference images not displayed]

FINDINGS: 1841 hours. Stable asymmetric elevation right hemidiaphragm. The
cardio pericardial silhouette is enlarged. The lungs are clear
without focal pneumonia, edema, pneumothorax or pleural effusion.
Right IJ central line tip overlies the distal SVC level near the RA
junction. The visualized bony structures of the thorax show no acute
abnormality. Telemetry leads overlie the chest.
IMPRESSION: No acute cardiopulmonary findings.

## 2021-08-05 IMAGING — CT CT HEAD W/O CM
4 series · 16 of 47 positions shown, 18 images · non-contrast
Comparison: 04/10/2020

CLINICAL DATA: Fall, head trauma

EXAM:
CT HEAD WITHOUT CONTRAST
TECHNIQUE: Contiguous axial images were obtained from the base of the skull
through the vertex without intravenous contrast.

[Series 3: head without · axial · non-contrast · 0.47mm/px · z∈[-106,-1]mm · 7 of 29 slices shown, 9 images]
[im 4/29  brain]
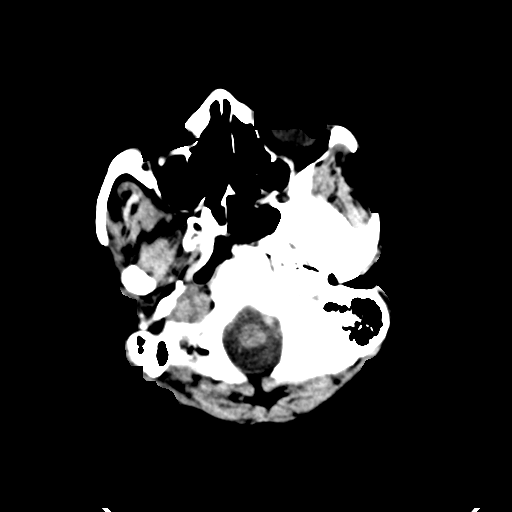
[im 4/29  bone]
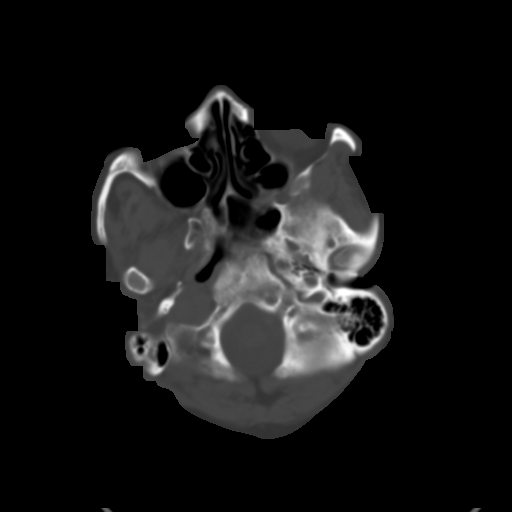
[im 8/29  brain]
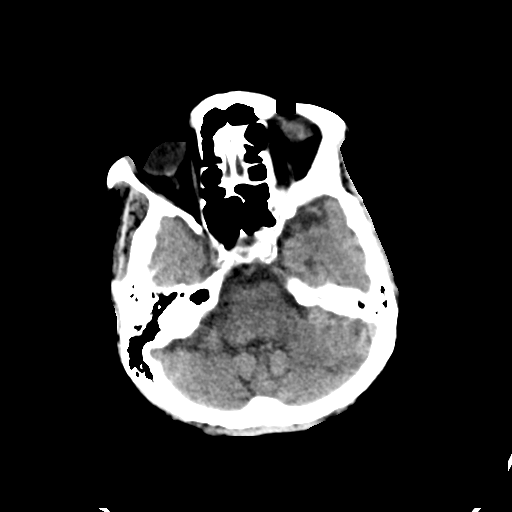
[im 11/29  brain]
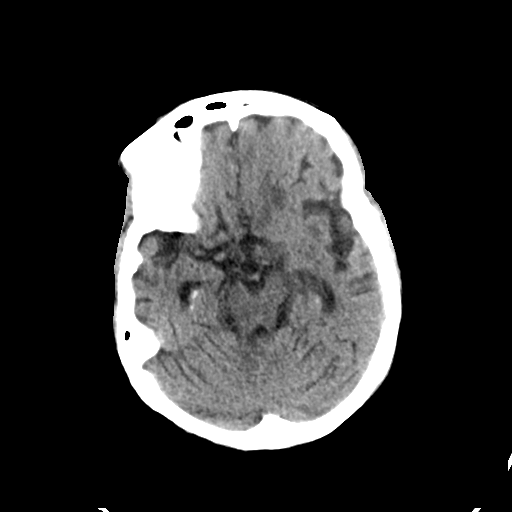
[im 15/29  brain]
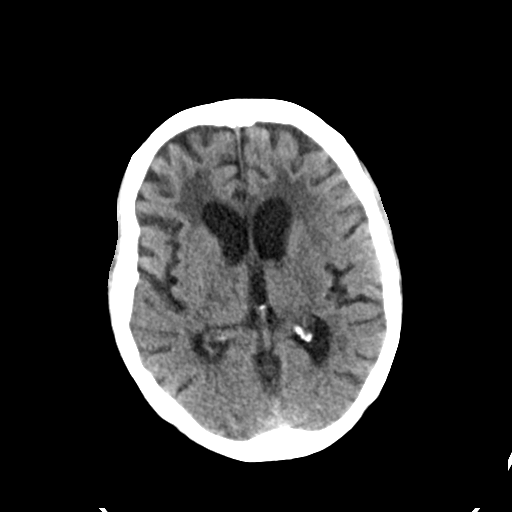
[im 18/29  brain]
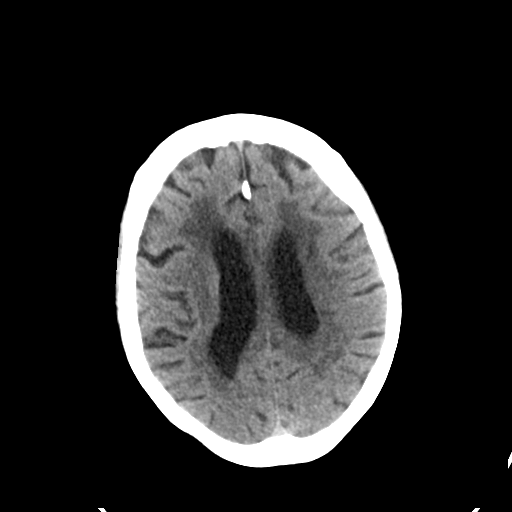
[im 18/29  bone]
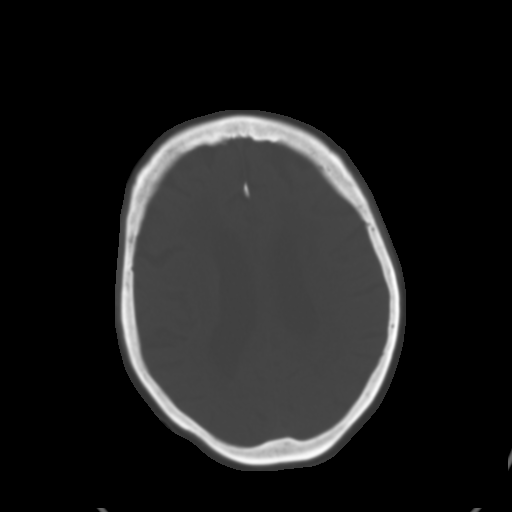
[im 22/29  brain]
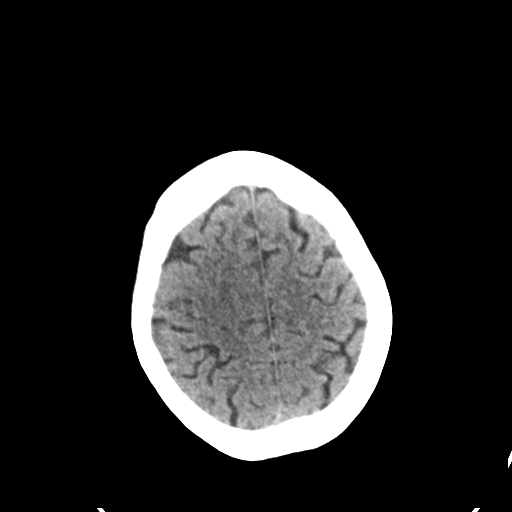
[im 25/29  brain]
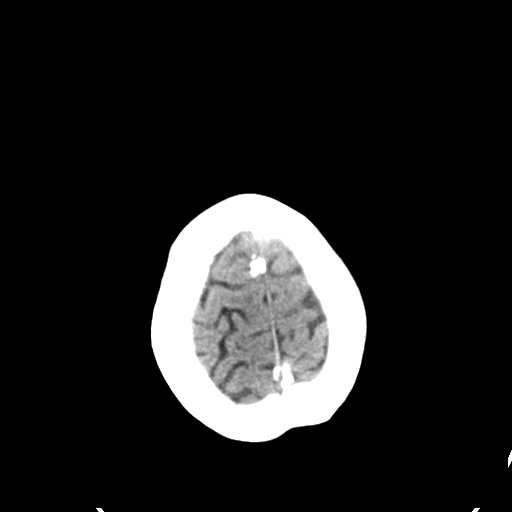

[Series 4: head bone · axial · 0.47mm/px · z∈[-107,-79]mm · 3 of 71 slices shown]
[im 8/71  bone]
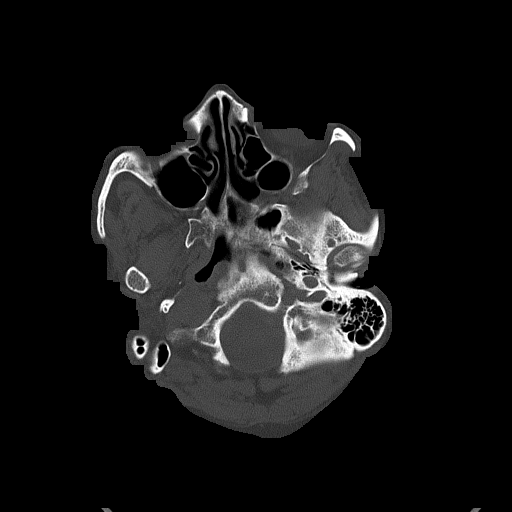
[im 15/71  bone]
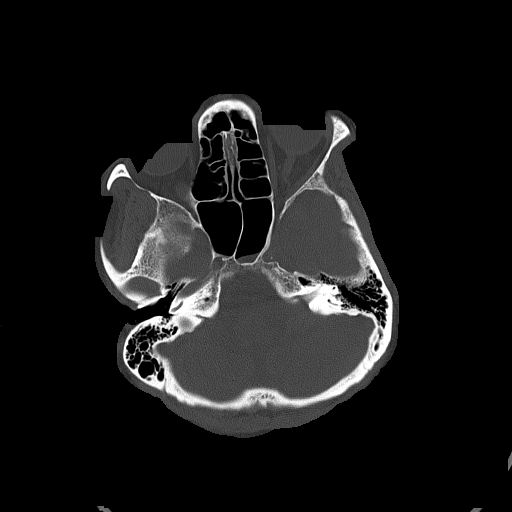
[im 22/71  bone]
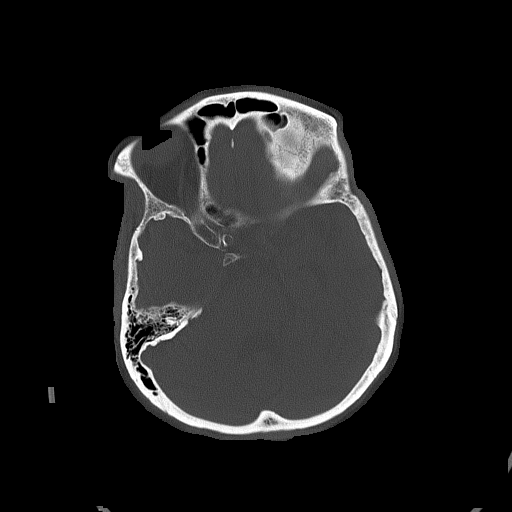

[Series 5: head without cor · coronal · non-contrast · 0.31mm/px · 3 of 67 slices shown]
[im 23/67  brain]
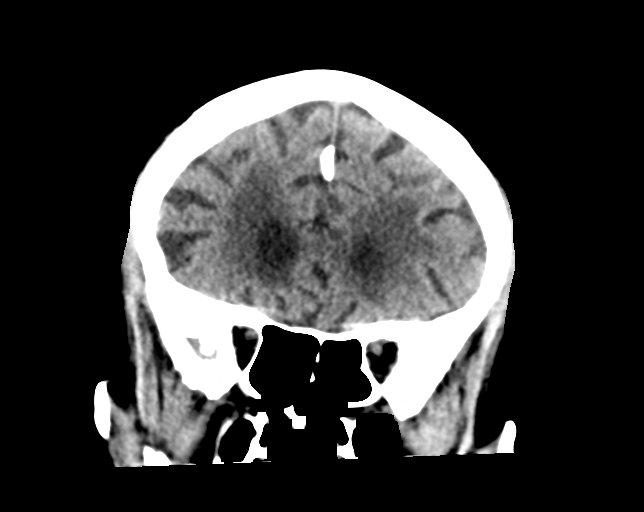
[im 30/67  brain]
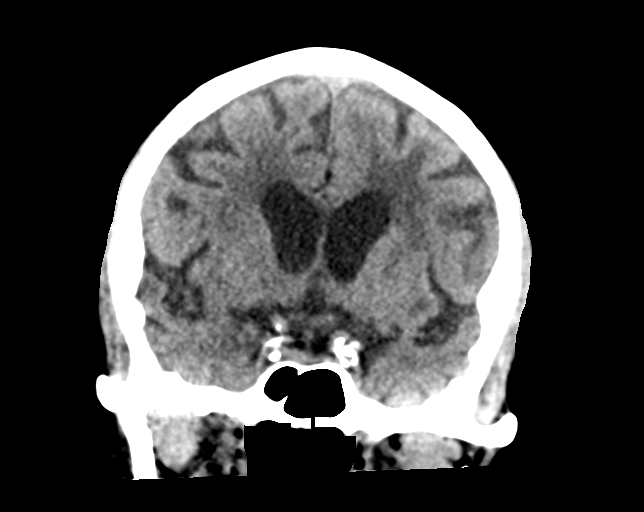
[im 37/67  brain]
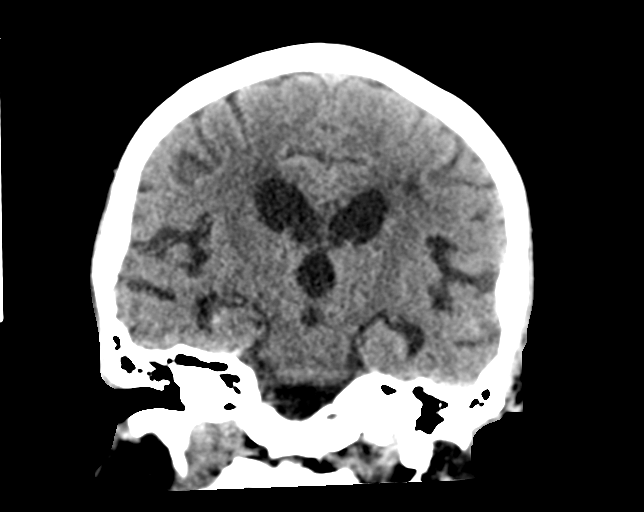

[Series 6: head without sag · sagittal · non-contrast · 0.33mm/px · 3 of 60 slices shown]
[im 20/60  brain]
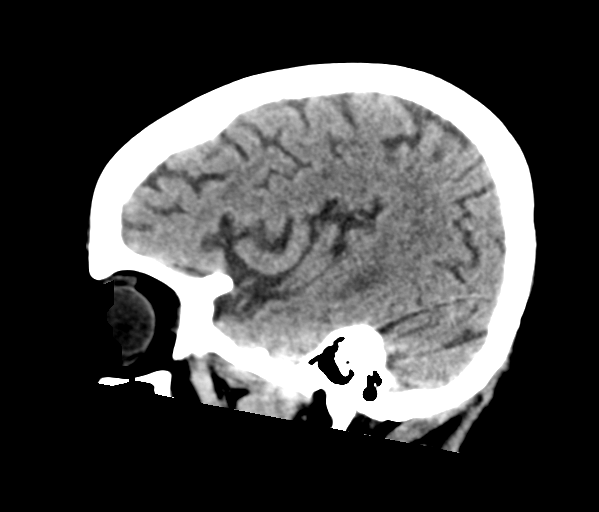
[im 30/60  brain]
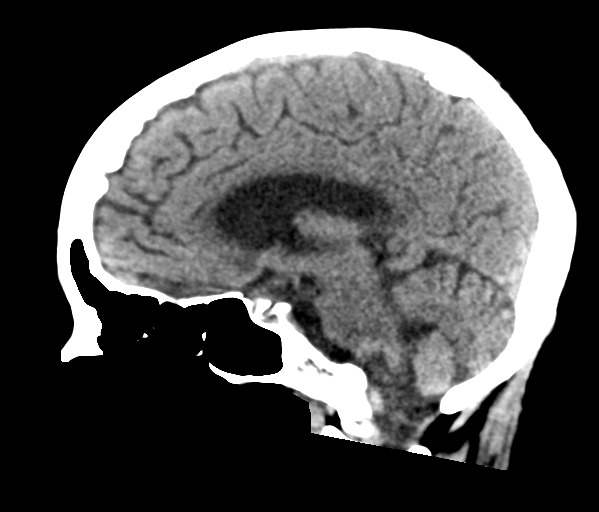
[im 40/60  brain]
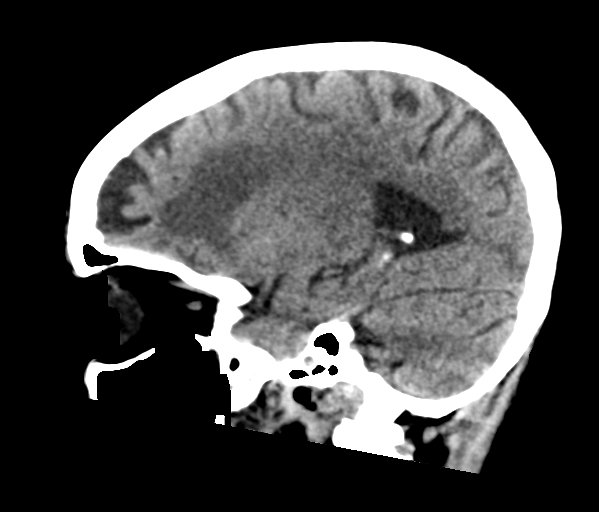

[16 of 47 positions shown; findings below may reference images not displayed]

FINDINGS: Brain: There is atrophy and chronic small vessel disease changes. No
acute intracranial abnormality. Specifically, no hemorrhage,
hydrocephalus, mass lesion, acute infarction, or significant
intracranial injury.

Vascular: No hyperdense vessel or unexpected calcification.

Skull: No acute calvarial abnormality.

Sinuses/Orbits: No acute findings

Other: None
IMPRESSION: Atrophy, chronic microvascular disease.

No acute intracranial abnormality.

## 2021-08-05 IMAGING — DX DG PELVIS 1-2V
1 series · 1 of 1 positions shown · non-contrast
Comparison: CT 09/05/2017

CLINICAL DATA: Post fall

EXAM:
PELVIS - 1-2 VIEW

[pelvis ap]
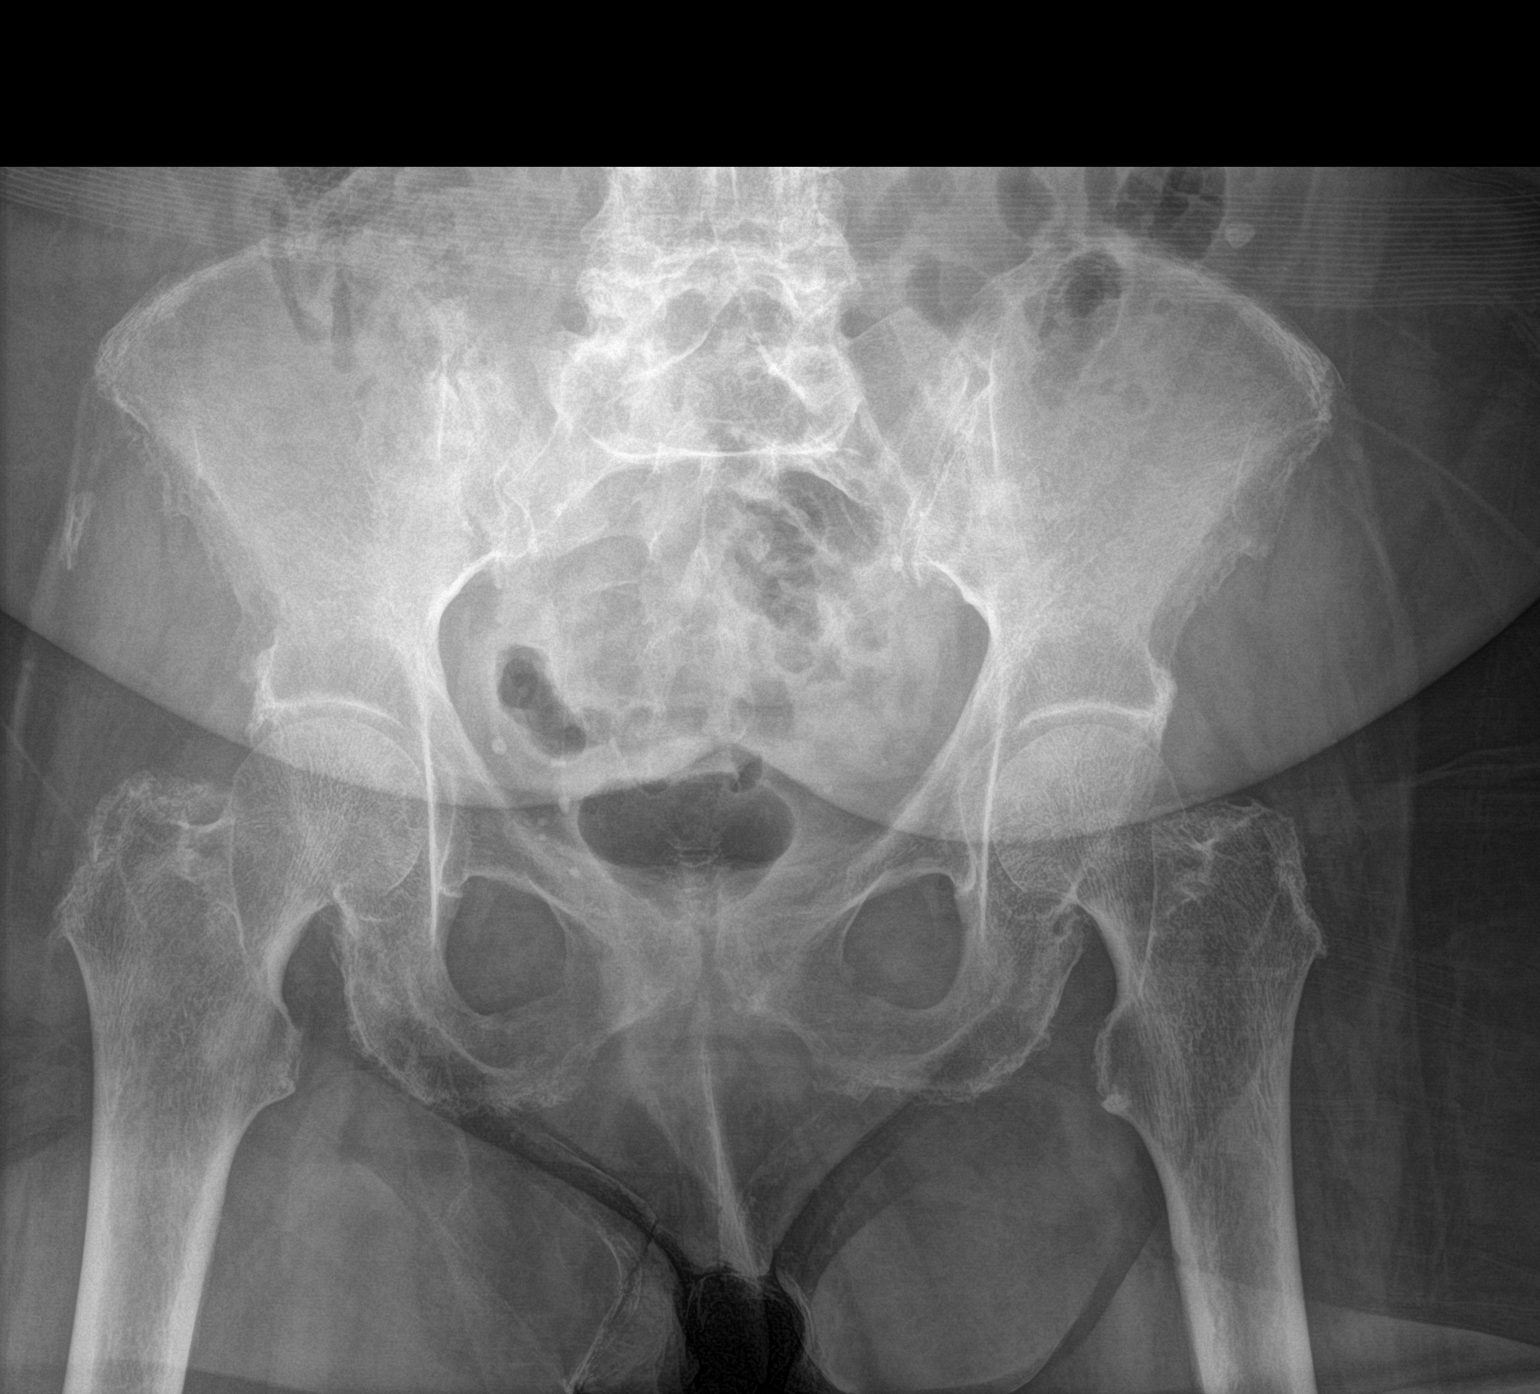

[1 of 1 positions shown; findings below may reference images not displayed]

FINDINGS: SI joints are non widened. Pubic symphysis and rami appear intact.
No fracture or malalignment.
IMPRESSION: Negative.

## 2021-09-09 IMAGING — DX DG CHEST 2V
2 series · 2 of 2 positions shown · non-contrast
Comparison: 05/11/2020

CLINICAL DATA: Shortness of breath.  Right lower extremity pain.

EXAM:
CHEST - 2 VIEW

[chest lat]
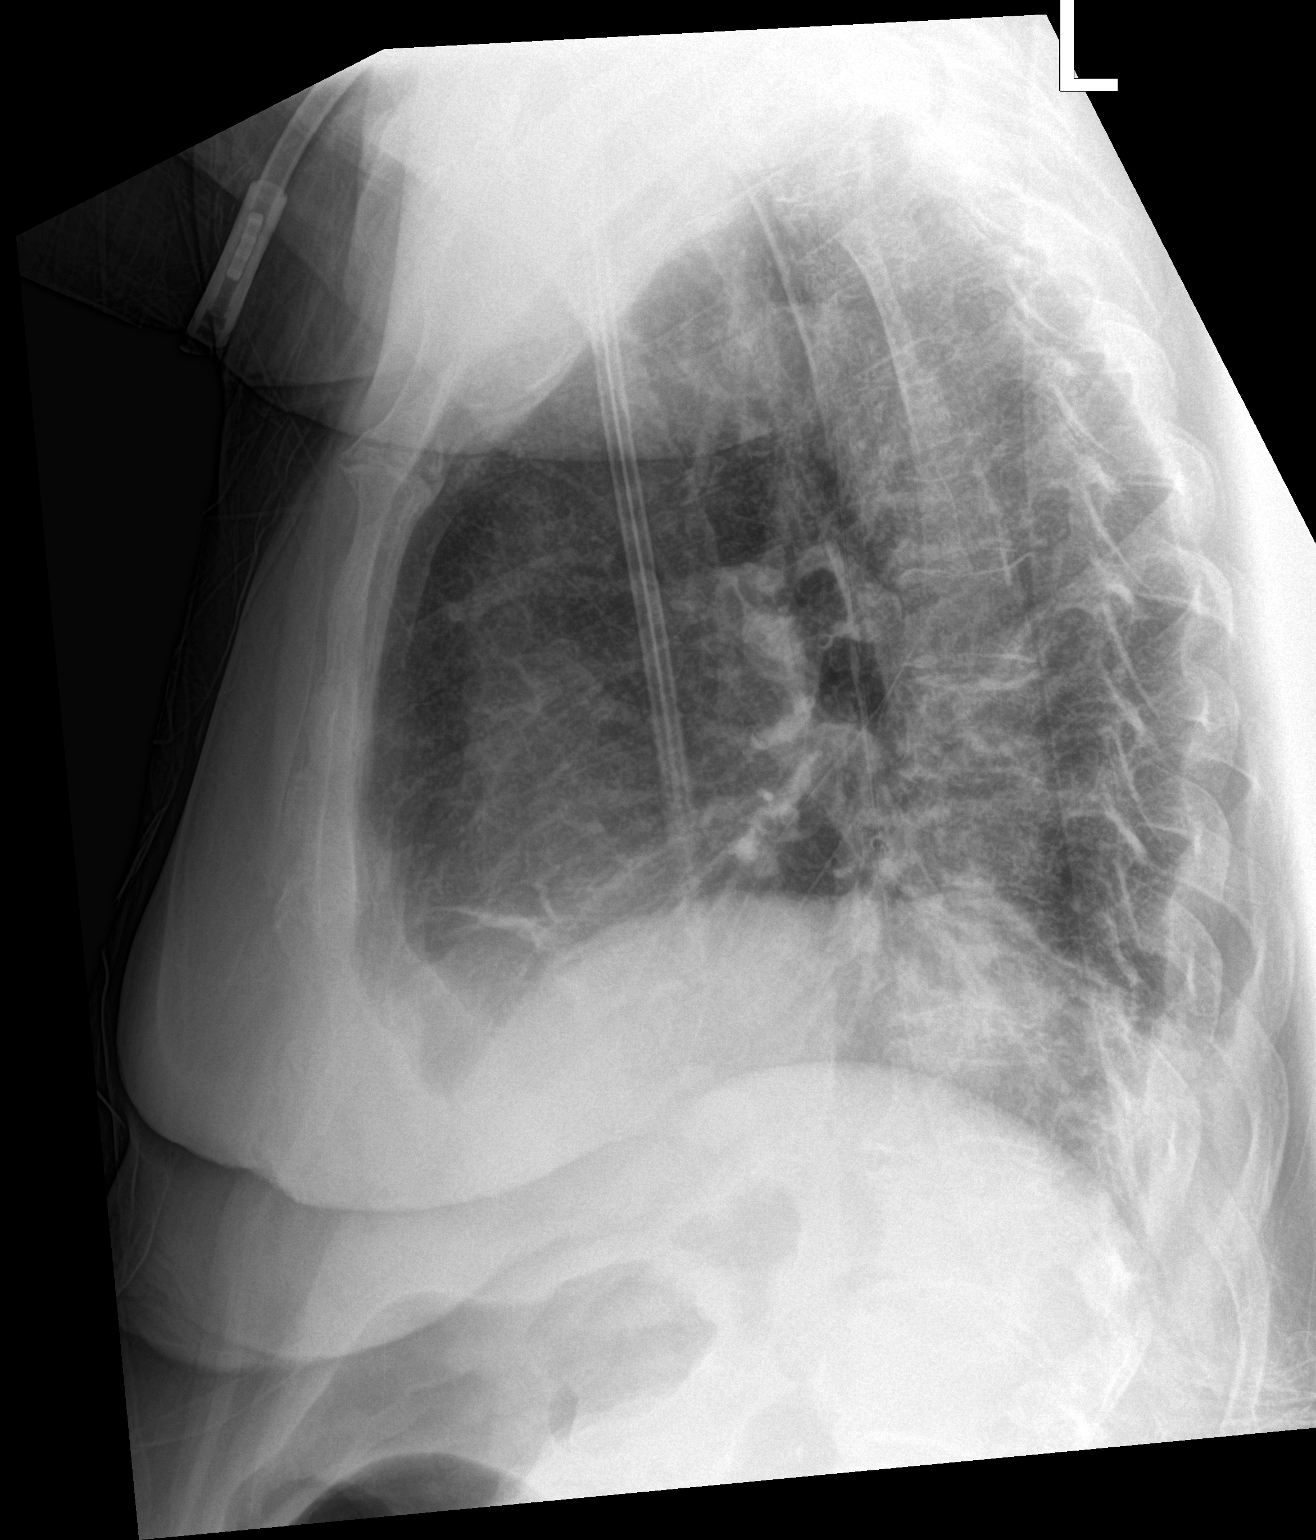

[chest ap]
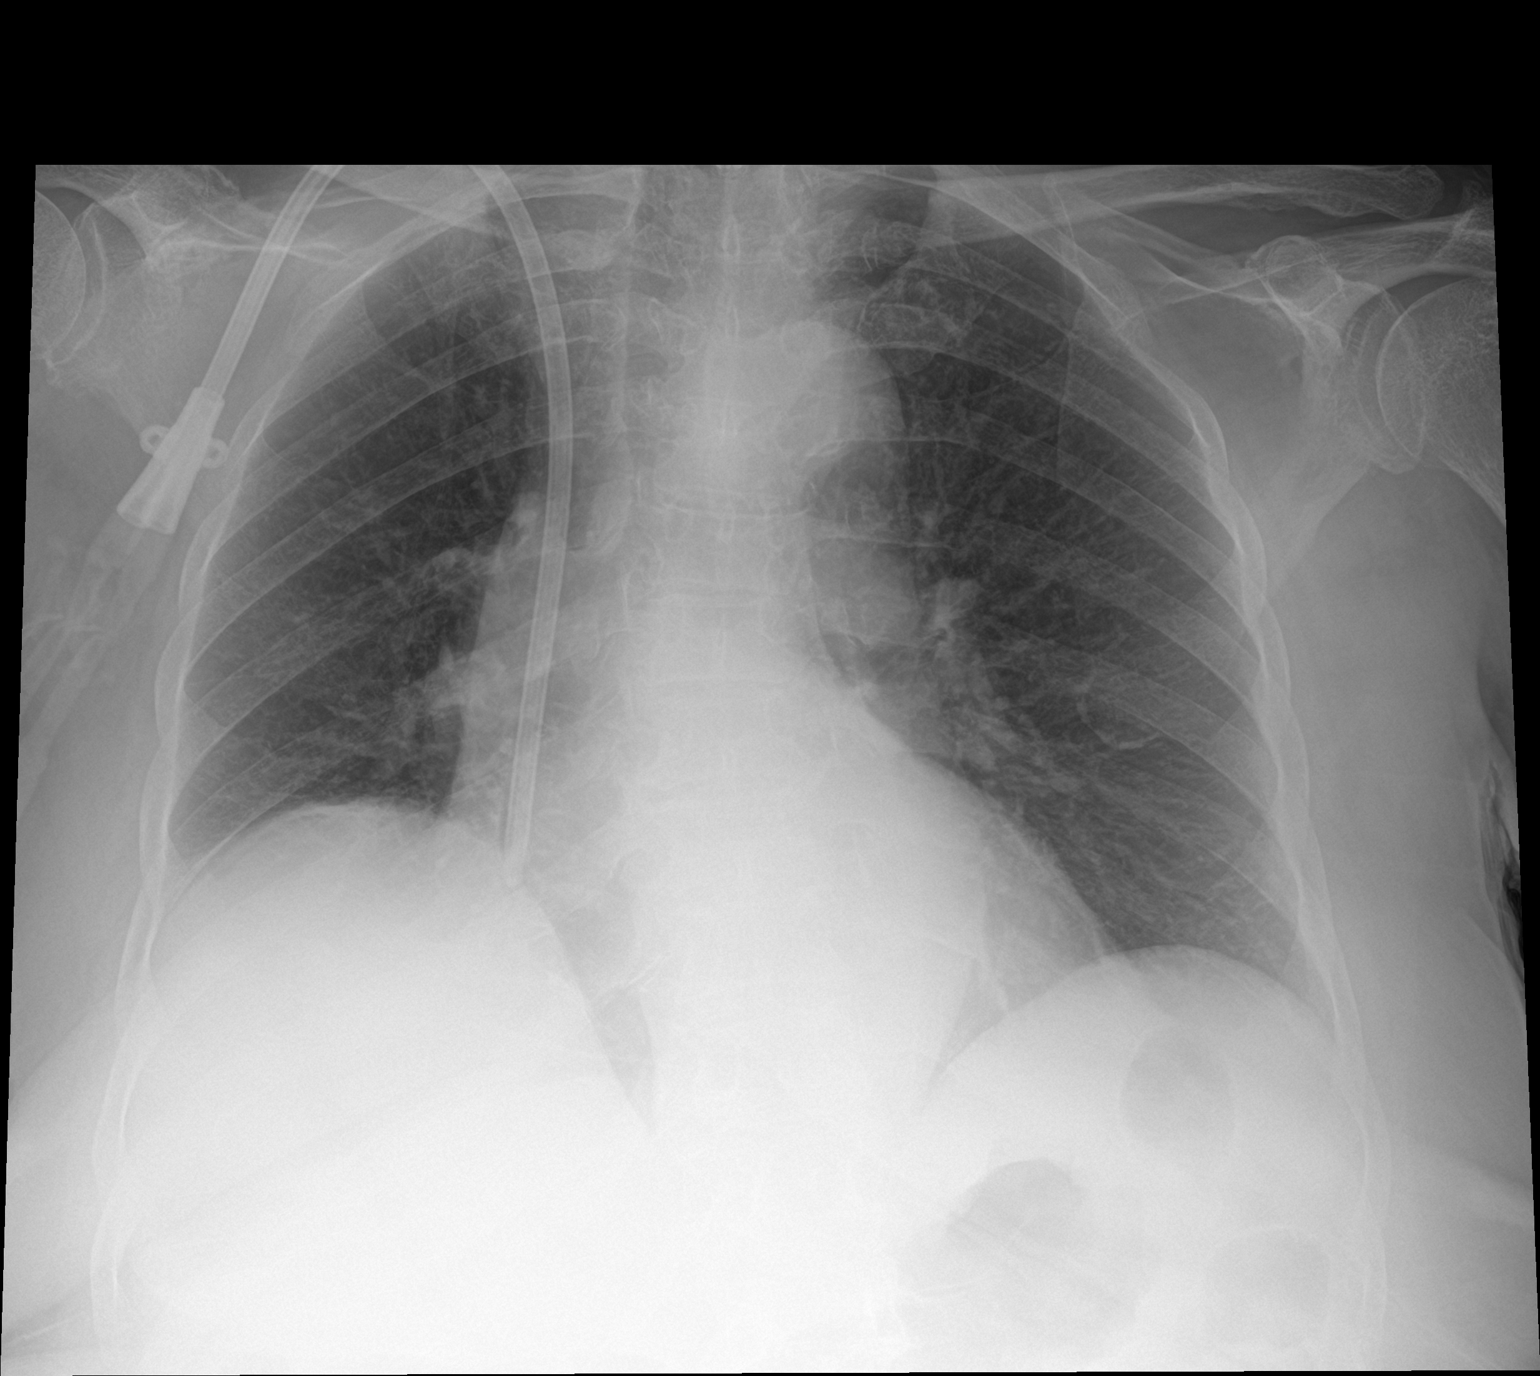

[2 of 2 positions shown; findings below may reference images not displayed]

FINDINGS: Stable mild cardiomegaly. Aortic atherosclerotic calcification
noted. Right dual lumen central venous catheter remains in
appropriate position. Both lungs are clear.
IMPRESSION: Mild cardiomegaly. No active lung disease.

## 2021-09-09 IMAGING — DX DG KNEE 1-2V*R*
2 series · 2 of 2 positions shown · non-contrast
Comparison: None.

CLINICAL DATA: 75-year-old female with right knee pain.

EXAM:
RIGHT KNEE - 1-2 VIEW

[knee ap]
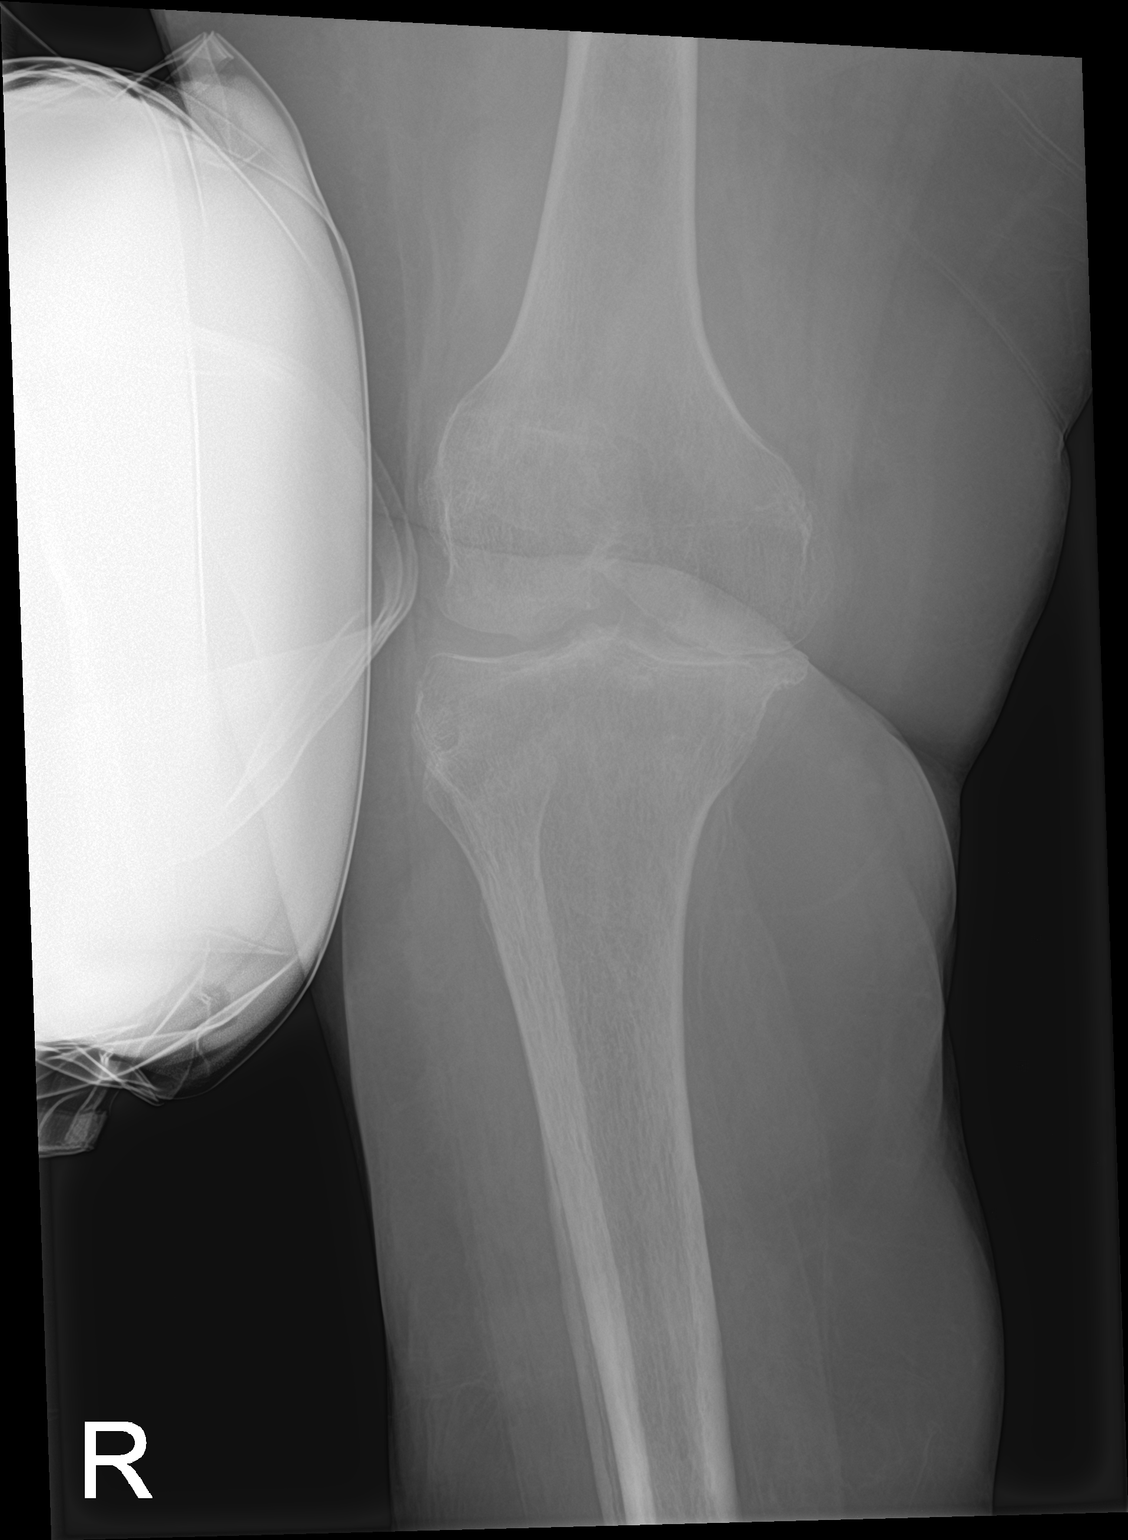

[knee lat]
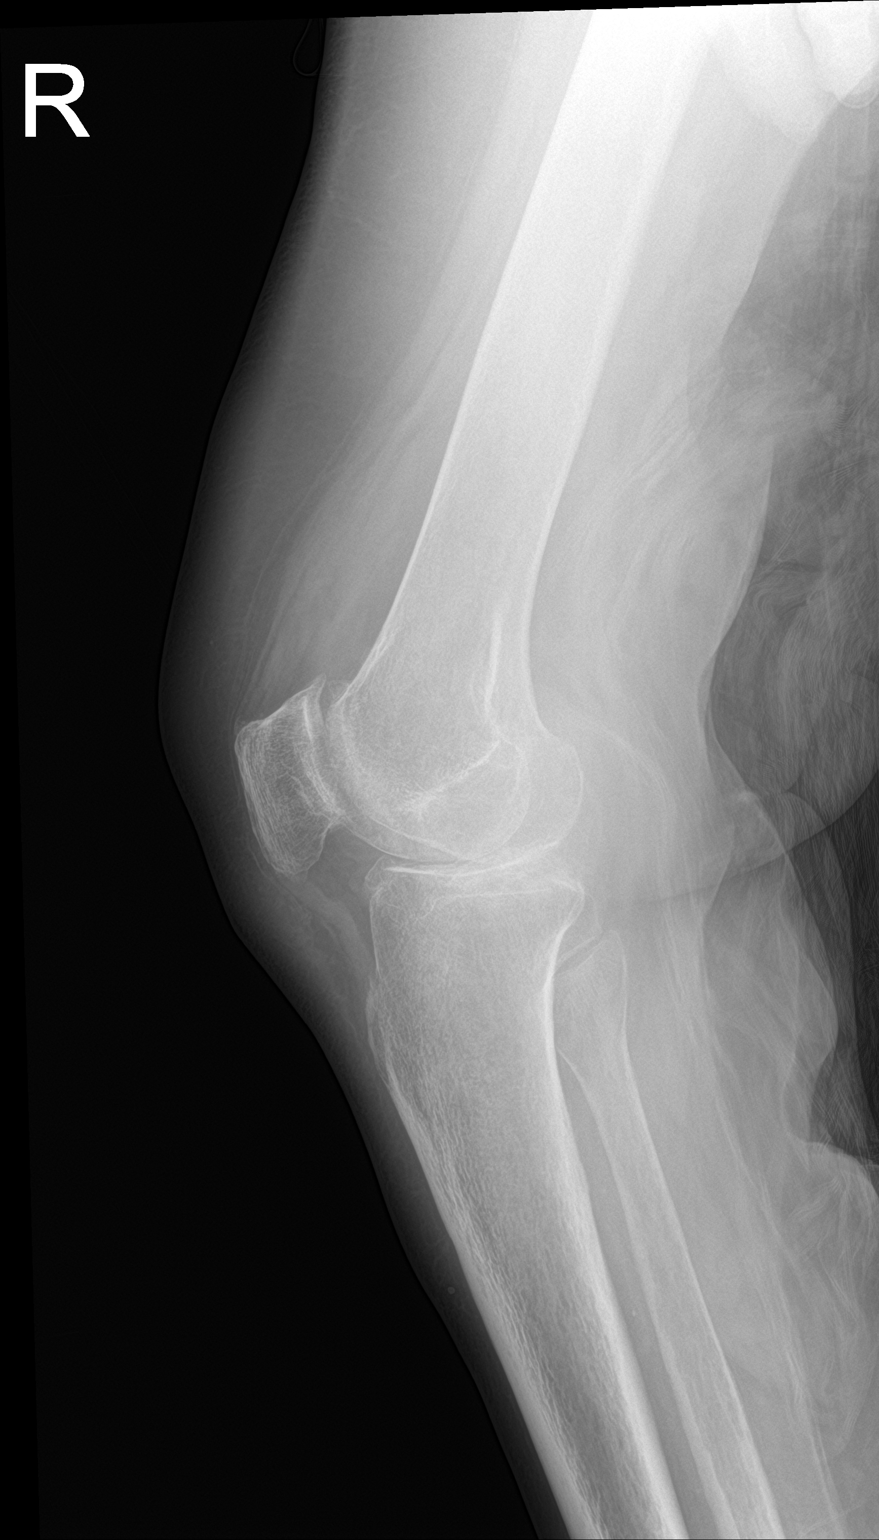

[2 of 2 positions shown; findings below may reference images not displayed]

FINDINGS: No acute fracture. The bones are osteopenic. There is osteoarthritic
changes with tricompartmental narrowing and severe narrowing of the
medial compartment. There is a moderate suprapatellar effusion. The
soft tissues are grossly unremarkable.
IMPRESSION: 1. No acute fracture or dislocation.
2. Osteopenia with tricompartmental osteoarthritis and a moderate
suprapatellar effusion.

## 2021-09-14 IMAGING — DX DG CHEST 1V PORT
1 series · 1 of 1 positions shown · non-contrast
Comparison: June 15, 2020

CLINICAL DATA: Shortness of breath

EXAM:
PORTABLE CHEST 1 VIEW

[chest ap]
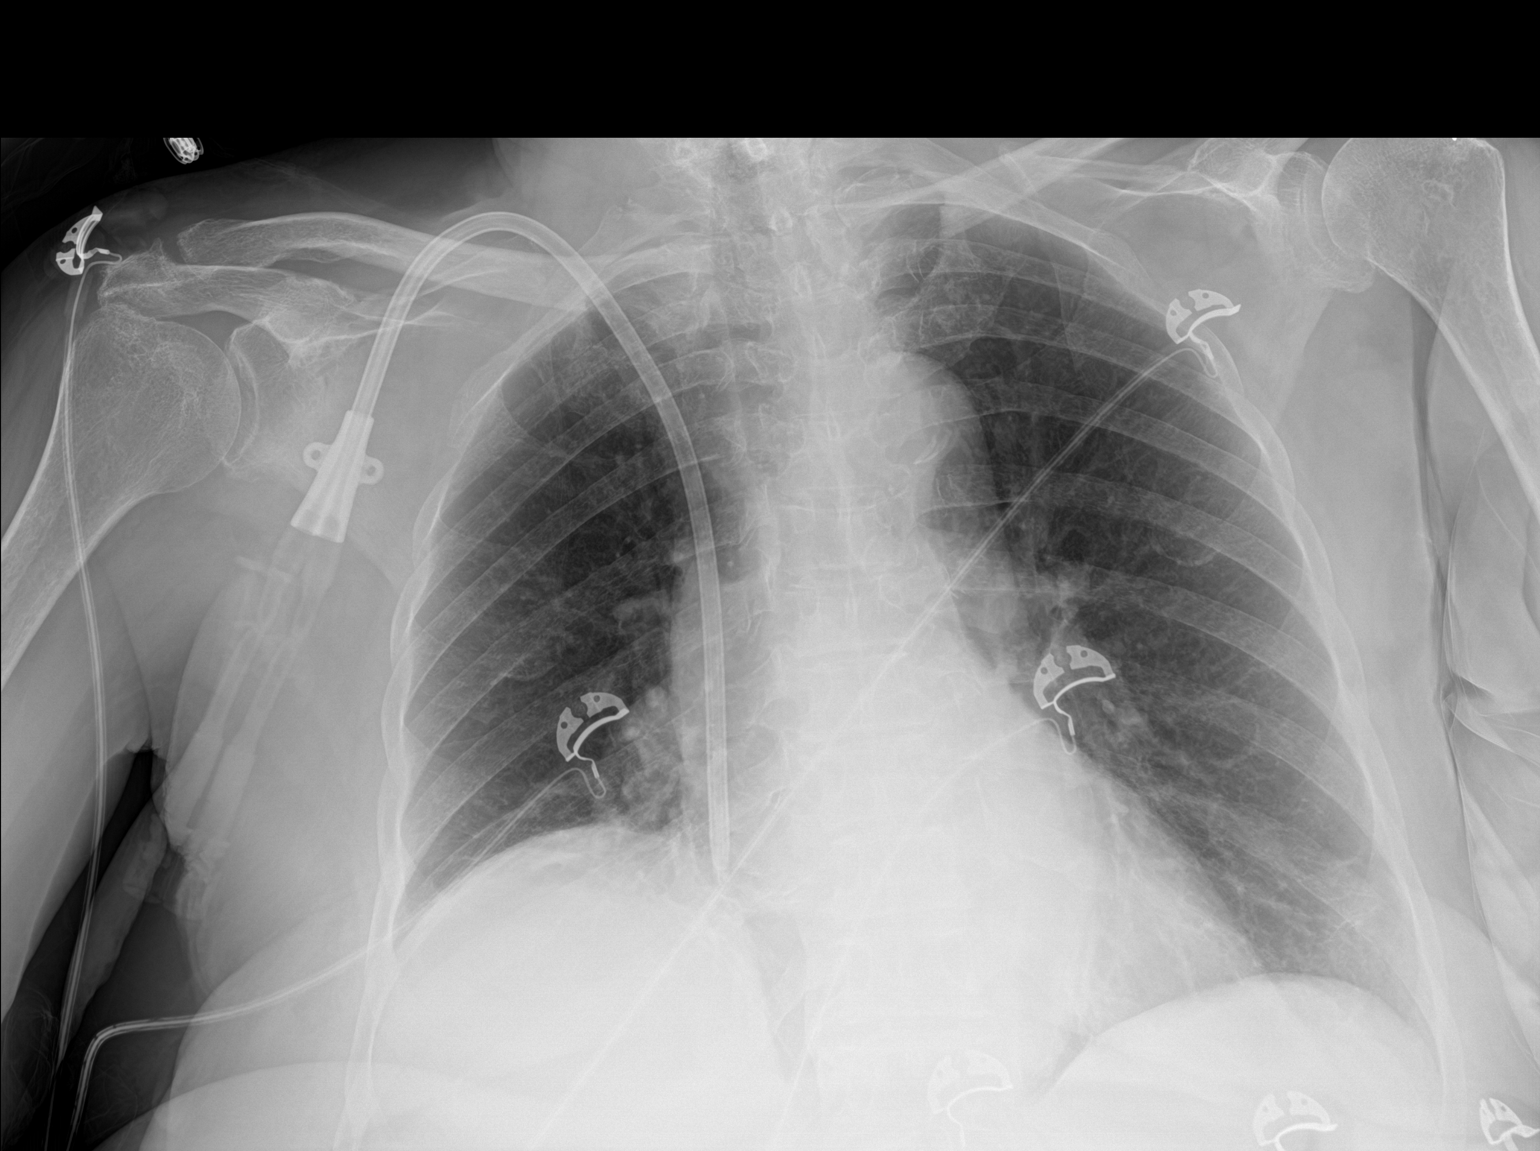

[1 of 1 positions shown; findings below may reference images not displayed]

FINDINGS: Central catheter tip is at the cavoatrial junction. No pneumothorax.
There is stable elevation the right hemidiaphragm with mild right
base atelectasis. Lungs elsewhere clear. Heart size and pulmonary
vascularity are normal. There is aortic atherosclerosis. No
adenopathy. No bone lesions.
IMPRESSION: Central catheter position unchanged without pneumothorax. Mild
elevation right hemidiaphragm with mild right base atelectasis.
Lungs otherwise clear. Stable cardiac silhouette. Aortic
Atherosclerosis ([id number].S).

## 2021-09-28 IMAGING — CR DG CHEST 1V
1 series · 1 of 1 positions shown · non-contrast
Comparison: Radiograph 06/20/2020

CLINICAL DATA: Fall

EXAM:
CHEST  1 VIEW

[chest ap]
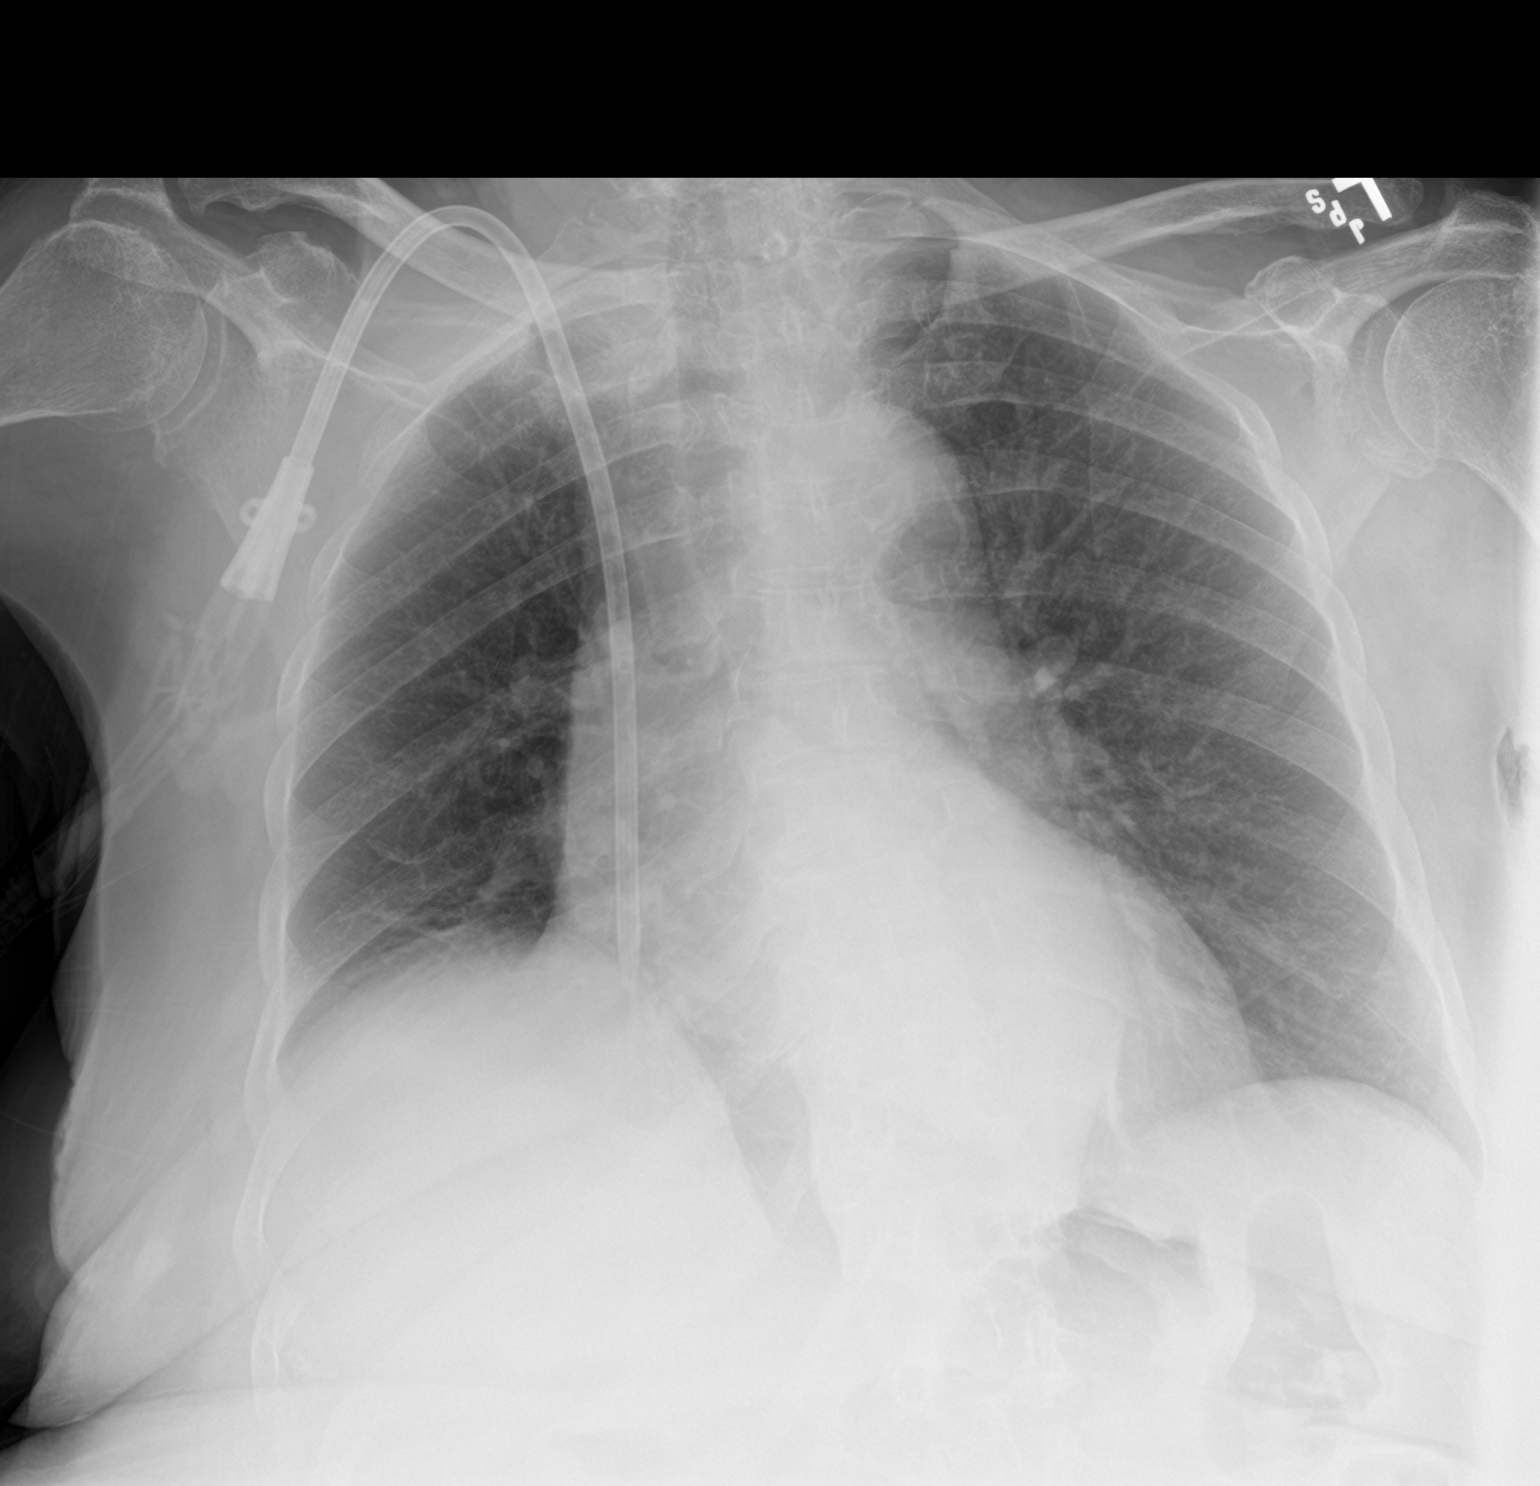

[1 of 1 positions shown; findings below may reference images not displayed]

FINDINGS: There is a right neck catheter with tip overlying the right atrium.
Unchanged, enlarged cardiac silhouette. There is no focal airspace
consolidation. Elevated right hemidiaphragm, unchanged. There is no
large pleural effusion or visible pneumothorax. There is no acute
osseous abnormality.
IMPRESSION: No evidence of acute cardiopulmonary disease. No radiographically
evident acute trauma in the chest.

## 2021-09-28 IMAGING — CR DG HIP (WITH OR WITHOUT PELVIS) 2-3V*L*
3 series · 3 of 3 positions shown · non-contrast
Comparison: Right hip radiograph 10/24/2016, left hip radiograph
01/06/2016.

CLINICAL DATA: Fall, pain

EXAM:
DG HIP (WITH OR WITHOUT PELVIS) 2-3V LEFT; DG HIP (WITH OR WITHOUT
PELVIS) 2-3V RIGHT

[pelvis ap]
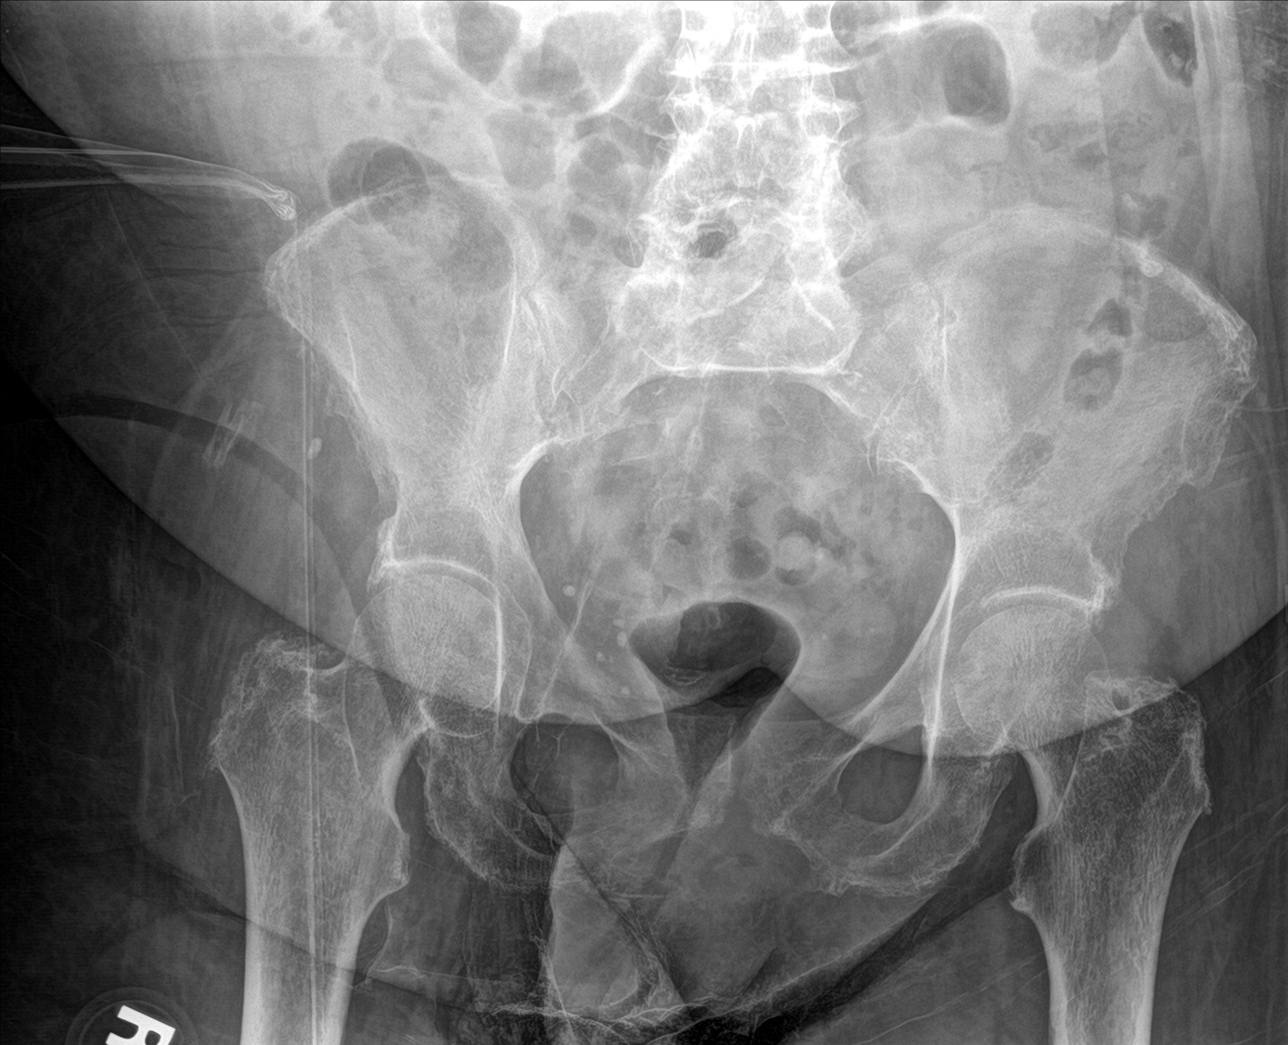

[hip ap]
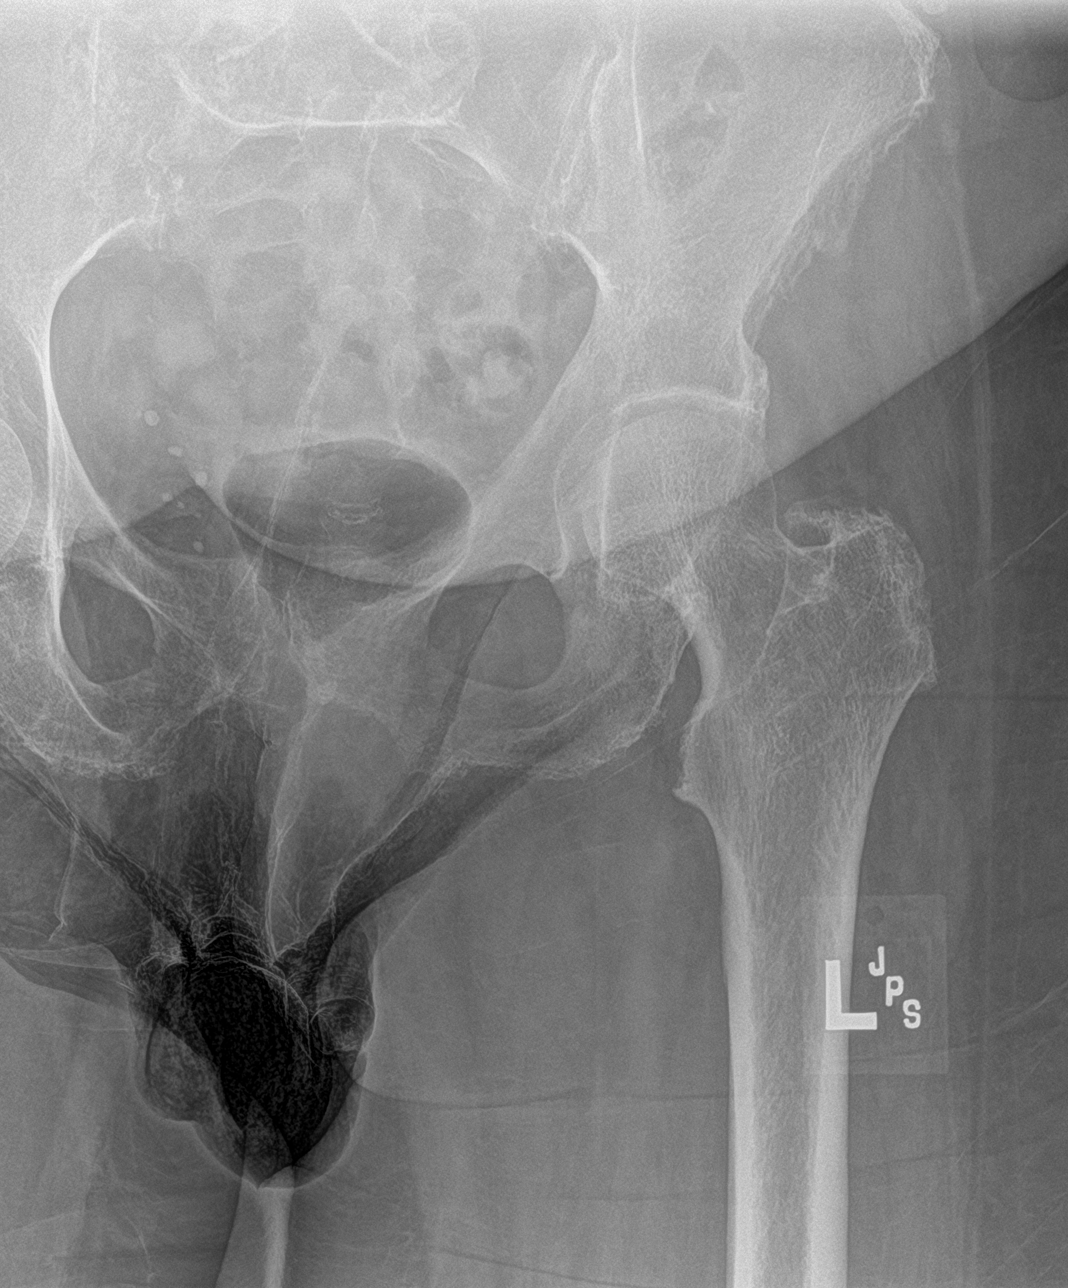

[hip lat]
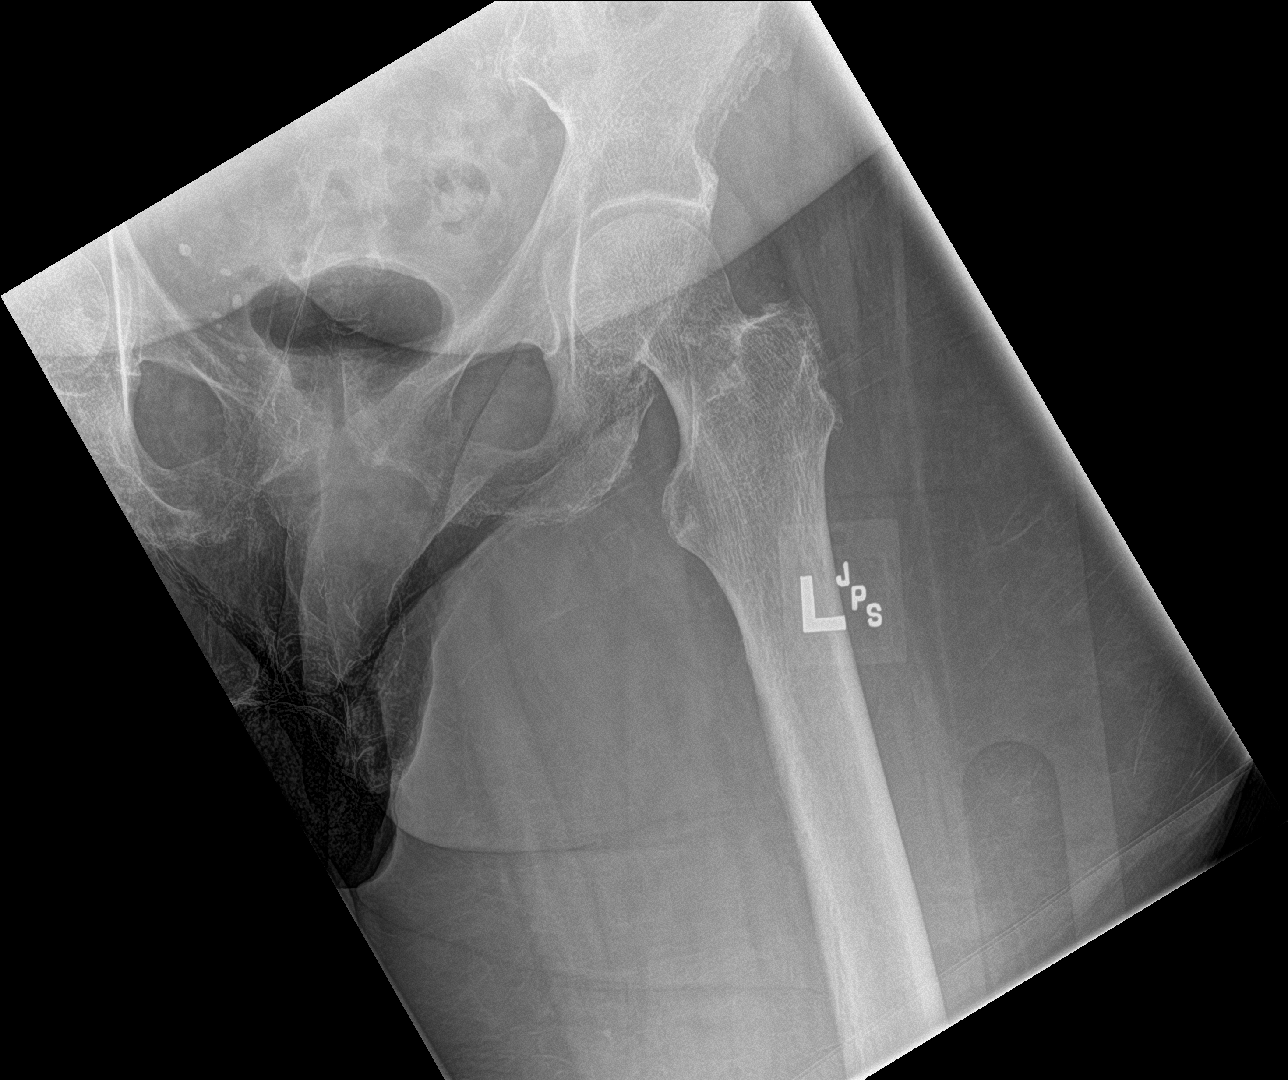

[3 of 3 positions shown; findings below may reference images not displayed]

FINDINGS: There is a possible nondisplaced left superior pubic ramus/pubic
body fracture versus projectional artifact. There is no evidence of
femoral neck fracture. No evidence of right hip fracture. There is
moderate bilateral hip degenerative arthritis. Lower lumbar spine
and SI joint degenerative change. There are enthesophytes
bilaterally along the greater trochanters. Dystrophic calcification
along the right gluteal soft tissues.
IMPRESSION: Left hip: Possible nondisplaced left superior pubic ramus/pubic body
fracture versus projectional artifact. No evidence of left proximal
femur fracture. Moderate osteoarthritis of the left hip.

Right hip: No evidence of acute fracture. Moderate osteoarthritis of
the right hip.

## 2022-01-17 IMAGING — DX DG CHEST 2V
2 series · 2 of 2 positions shown · non-contrast
Comparison: July 04, 2020.

CLINICAL DATA: Abnormal chest sounds.

EXAM:
CHEST - 2 VIEW

[view not recorded]
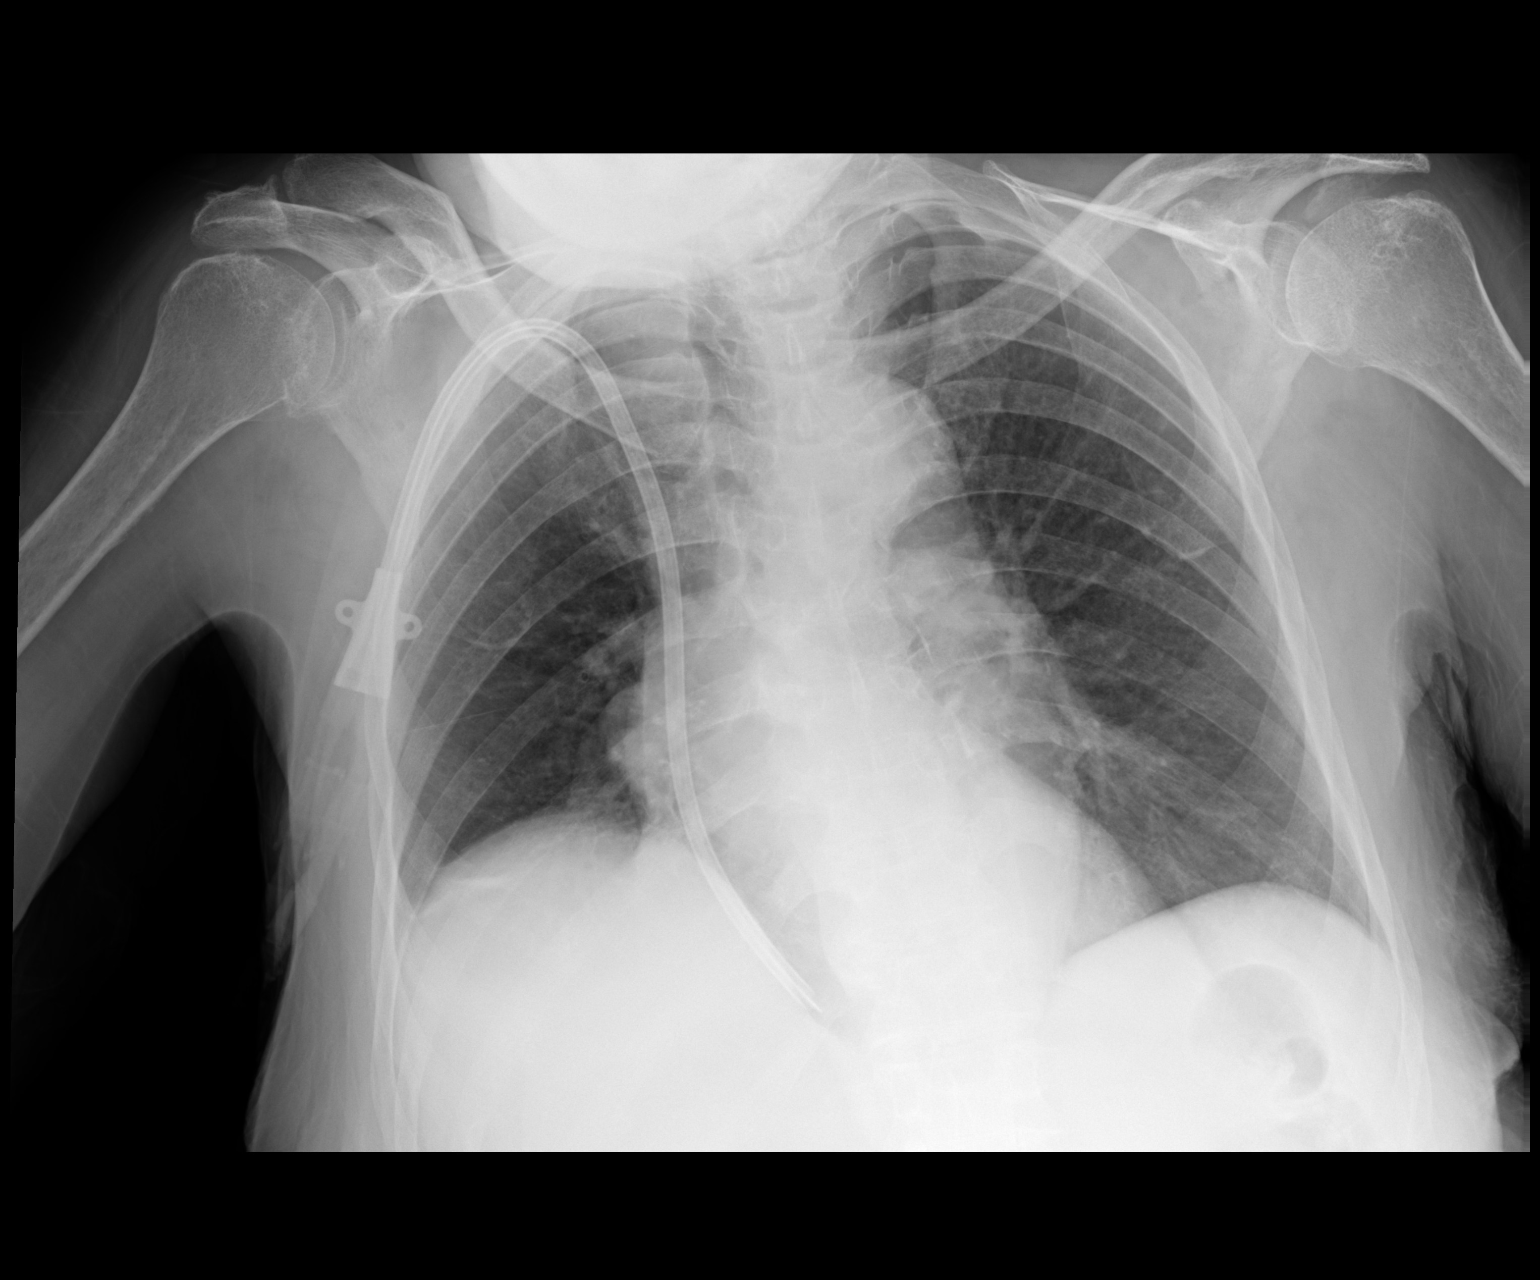

[dg chest 2 view]
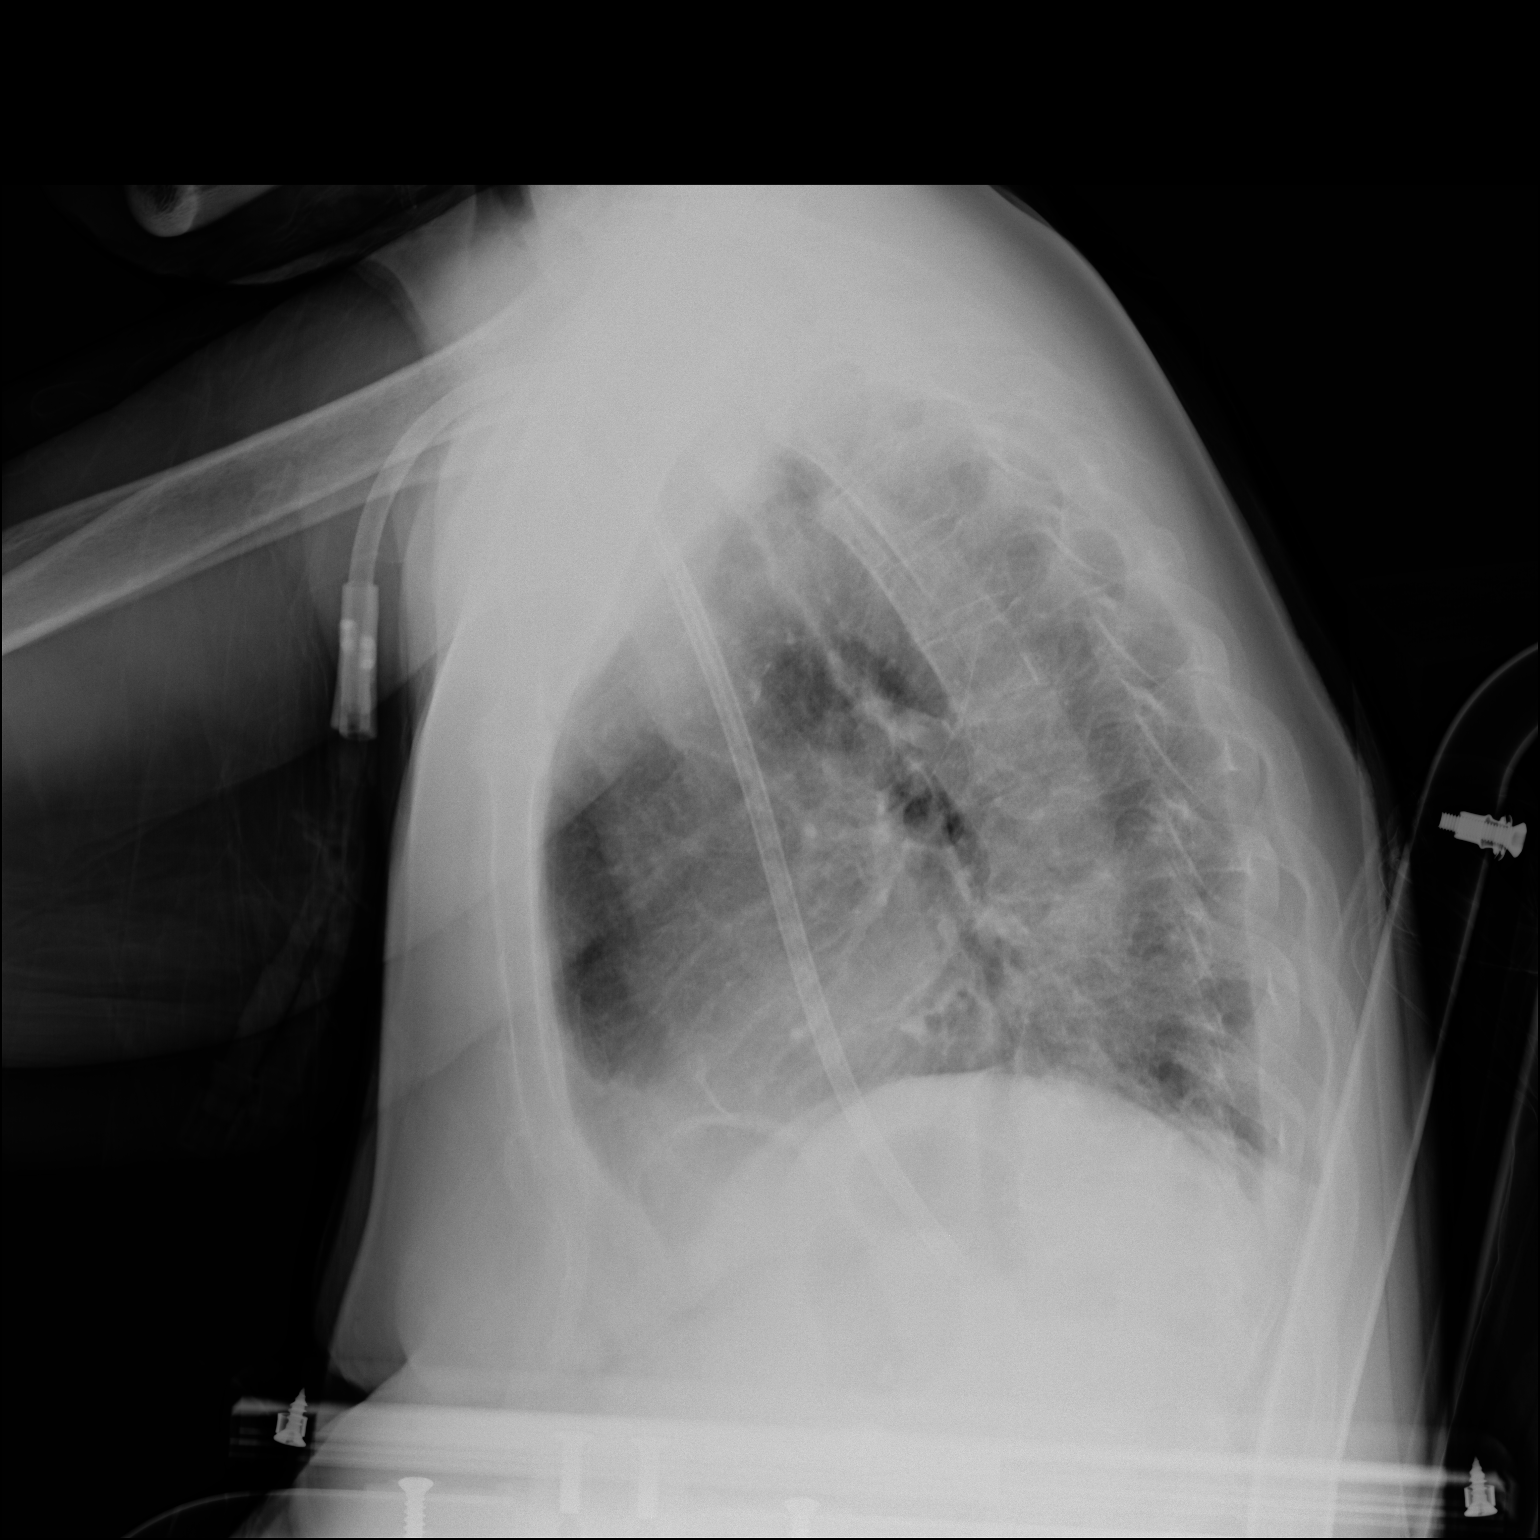

[2 of 2 positions shown; findings below may reference images not displayed]

FINDINGS: Stable cardiomediastinal silhouette. Right internal jugular dialysis
catheter is unchanged in position. Both lungs are clear. The
visualized skeletal structures are unremarkable.
IMPRESSION: No active cardiopulmonary disease.

## 2022-02-03 IMAGING — CT CT HEAD W/O CM
4 series · 16 of 47 positions shown, 18 images · non-contrast
Comparison: 05/11/2020

CLINICAL DATA: Mental status change, hypertensive emergency

EXAM:
CT HEAD WITHOUT CONTRAST
TECHNIQUE: Contiguous axial images were obtained from the base of the skull
through the vertex without intravenous contrast.

[Series 3: head wo · axial · 0.39mm/px · z∈[-170,-64]mm · 7 of 29 slices shown, 9 images]
[im 4/29  brain]
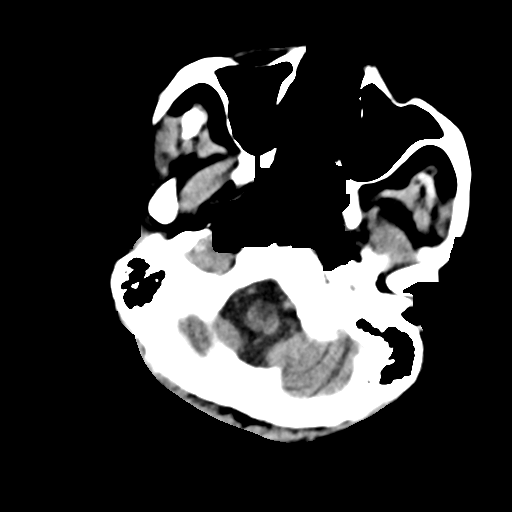
[im 4/29  bone]
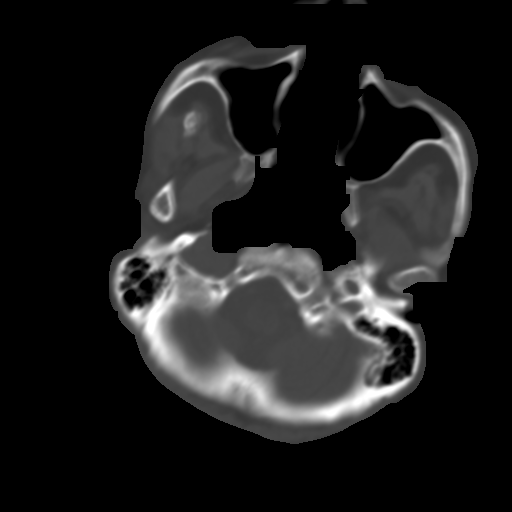
[im 8/29  brain]
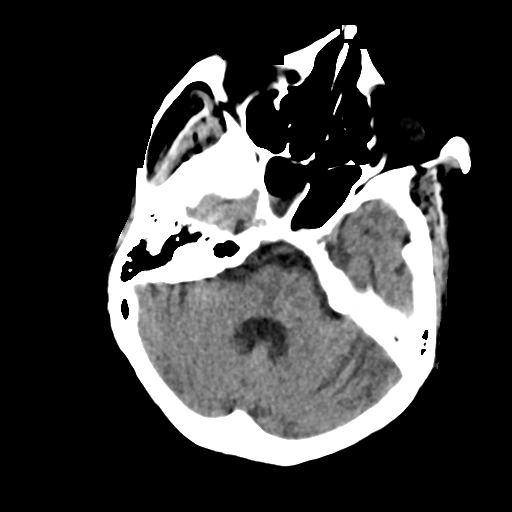
[im 11/29  brain]
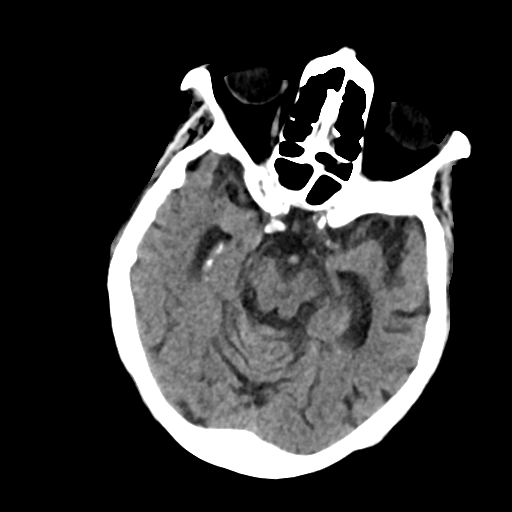
[im 15/29  brain]
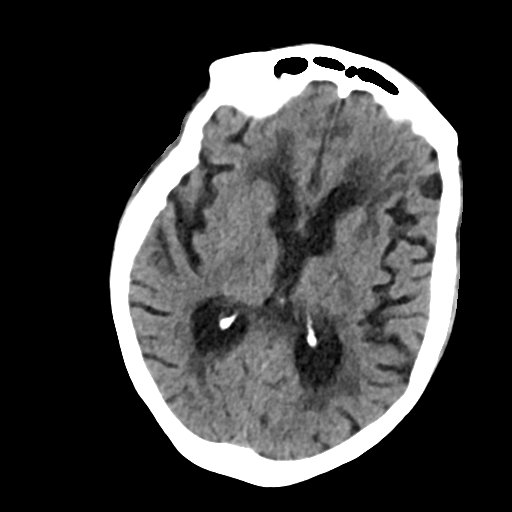
[im 18/29  brain]
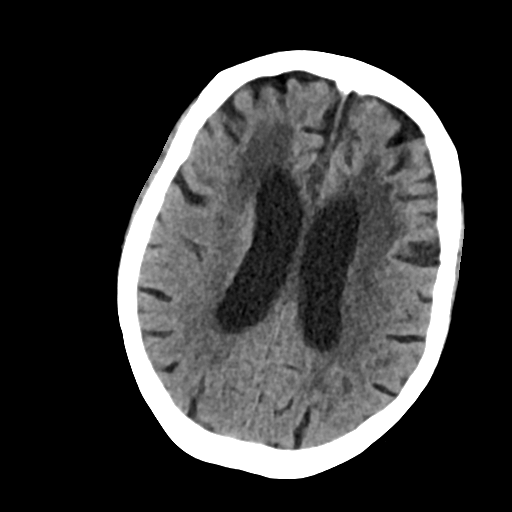
[im 18/29  bone]
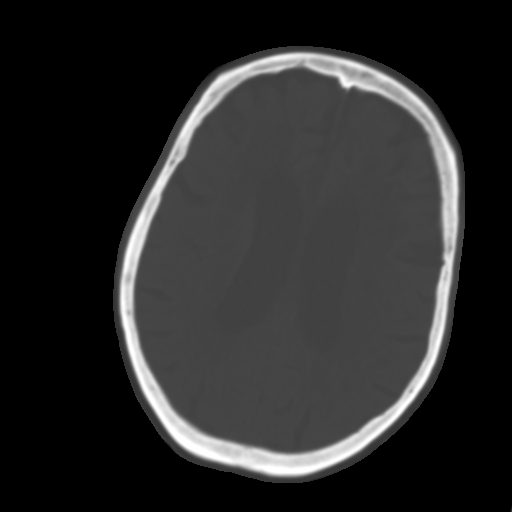
[im 22/29  brain]
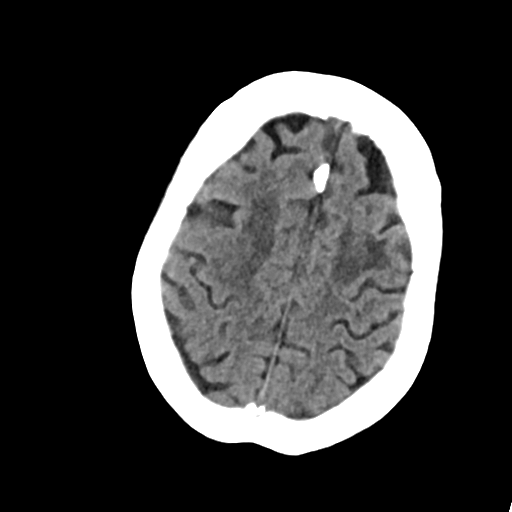
[im 25/29  brain]
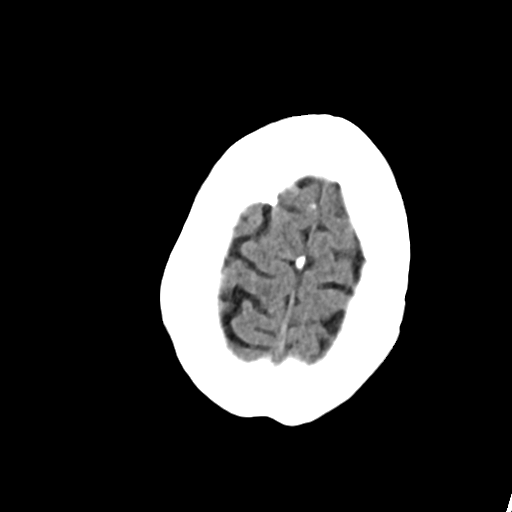

[Series 4: head bone · axial · 0.39mm/px · z∈[-170,-142]mm · 3 of 72 slices shown]
[im 8/72  bone]
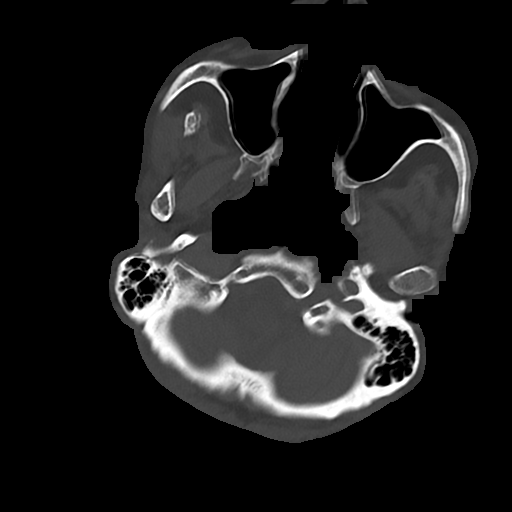
[im 15/72  bone]
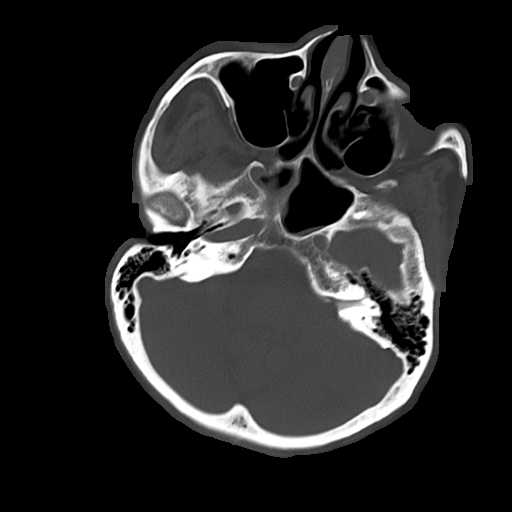
[im 22/72  bone]
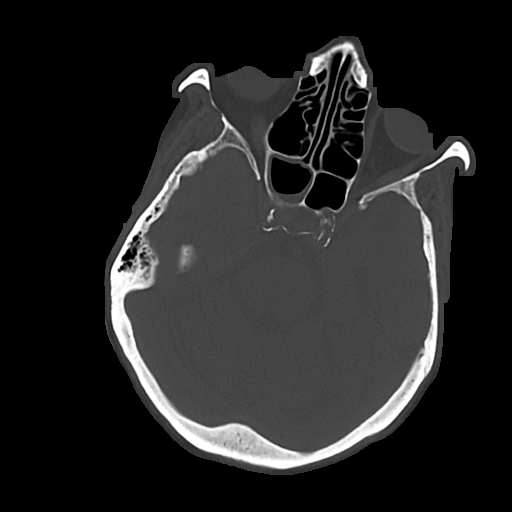

[Series 5: cor soft · coronal · 0.29mm/px · 3 of 64 slices shown]
[im 22/64  brain]
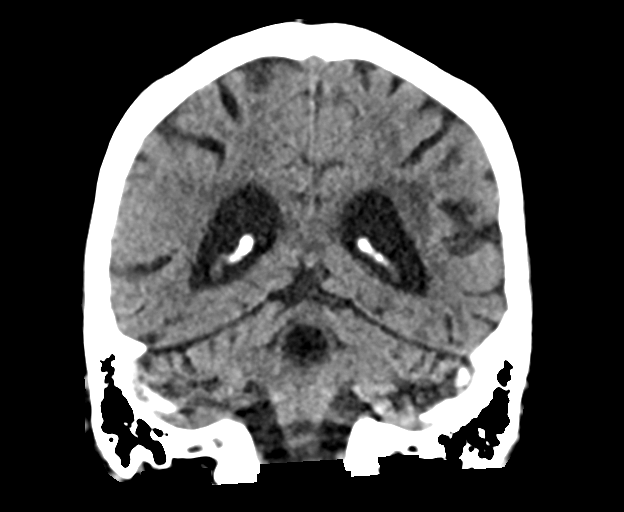
[im 29/64  brain]
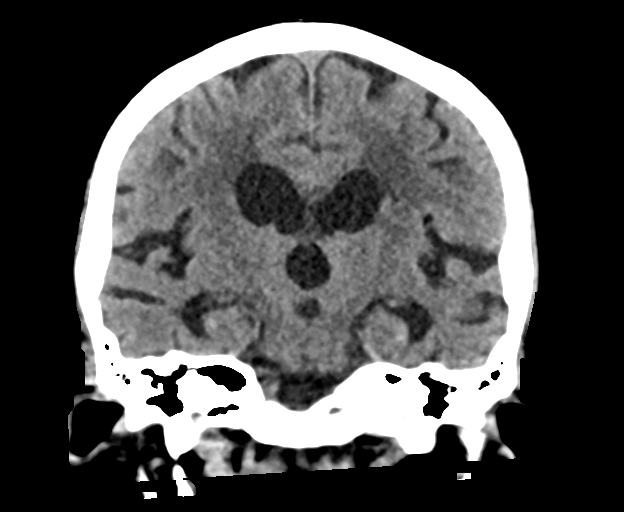
[im 36/64  brain]
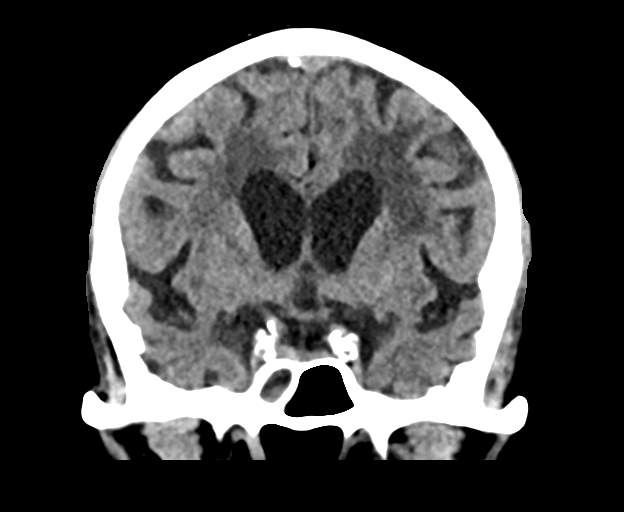

[Series 6: sag soft · sagittal · 0.29mm/px · 3 of 57 slices shown]
[im 19/57  brain]
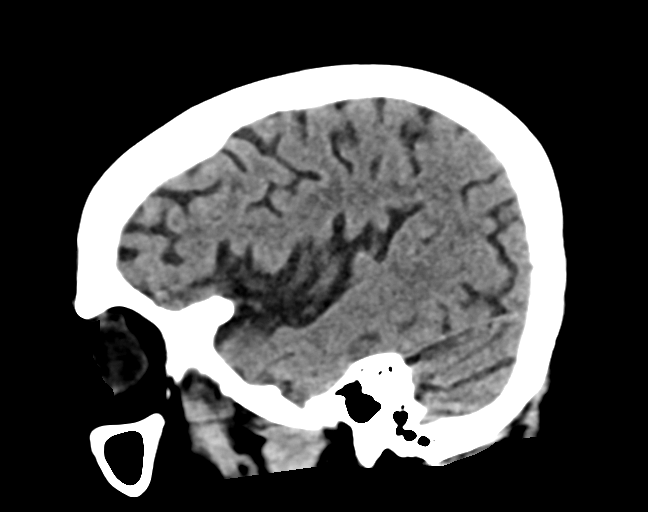
[im 29/57  brain]
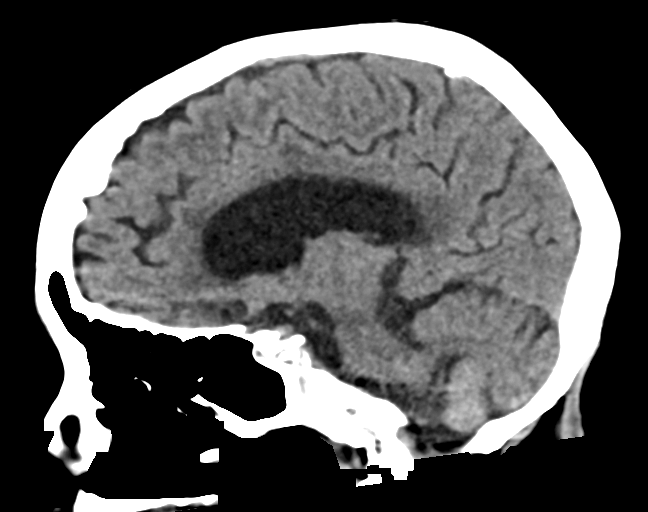
[im 38/57  brain]
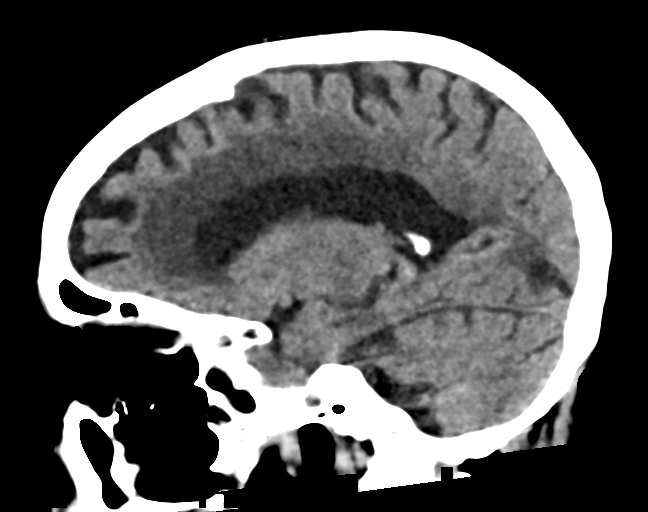

[16 of 47 positions shown; findings below may reference images not displayed]

FINDINGS: Brain: There is atrophy and chronic small vessel disease changes. No
acute intracranial abnormality. Specifically, no hemorrhage,
hydrocephalus, mass lesion, acute infarction, or significant
intracranial injury.

Vascular: No hyperdense vessel or unexpected calcification.

Skull: No acute calvarial abnormality.

Sinuses/Orbits: No acute findings

Other: None
IMPRESSION: Atrophy, chronic microvascular disease.

No acute intracranial abnormality.

## 2022-02-03 IMAGING — DX DG CHEST 1V PORT
1 series · 1 of 1 positions shown · non-contrast
Comparison: 09/23/2020

CLINICAL DATA: Syncope, seizure

EXAM:
PORTABLE CHEST 1 VIEW

[chest ap]
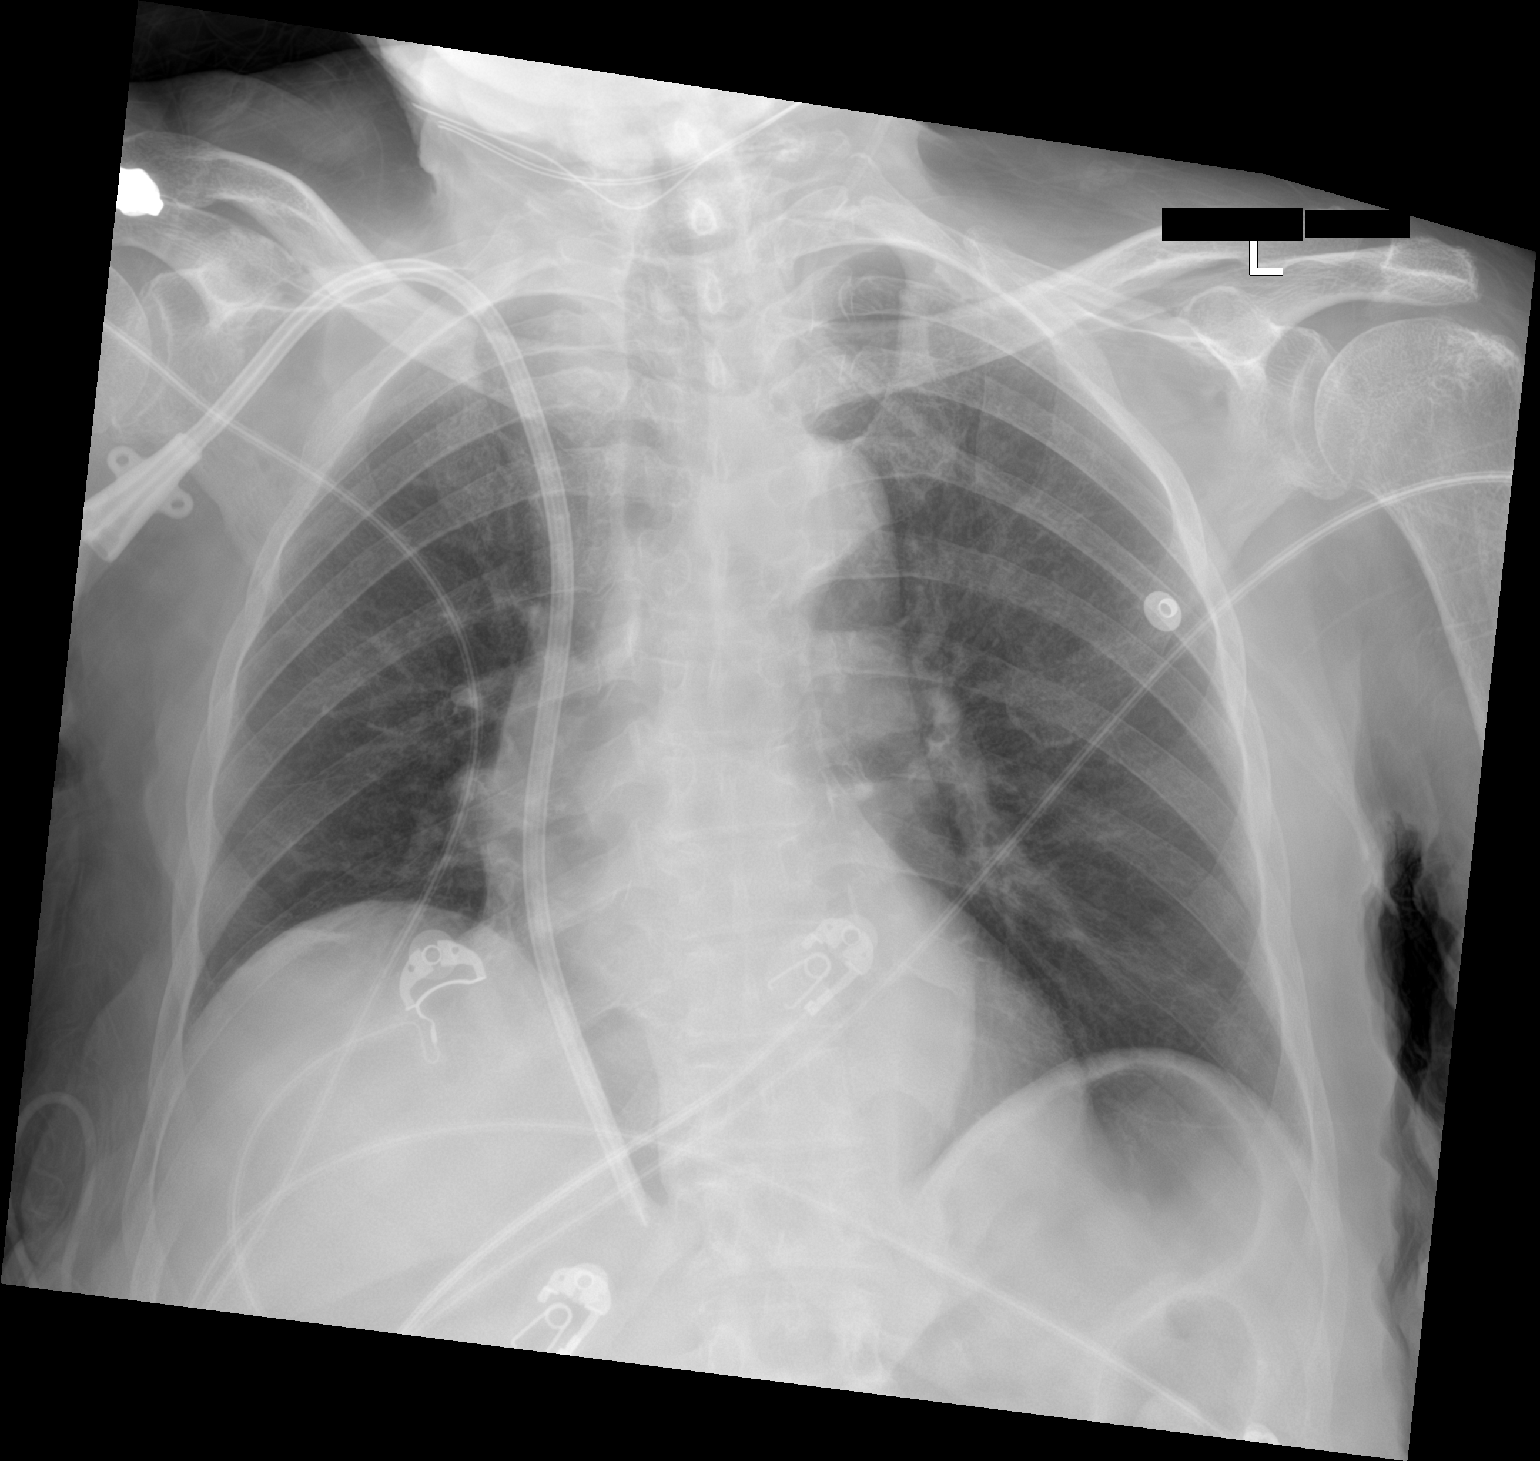

[1 of 1 positions shown; findings below may reference images not displayed]

FINDINGS: Right dialysis catheter remains in stable position with the tip in
the lower right atrium. Heart is normal size. Lungs clear. No
effusions or pneumothorax. No acute bony abnormality.
IMPRESSION: No active disease.

## 2022-02-04 IMAGING — CT CT CHEST W/O CM
2 of 4 series · 13 of 46 positions shown, 15 images · non-contrast
Comparison: Chest radiograph dated 11/09/2020. CT pelvis dated
07/04/2020. CT abdomen/pelvis dated 09/05/2017.

CLINICAL DATA: Sepsis

EXAM:
CT CHEST, ABDOMEN AND PELVIS WITHOUT CONTRAST
TECHNIQUE: Multidetector CT imaging of the chest, abdomen and pelvis was
performed following the standard protocol without IV contrast.

[Series 3: cap without · axial · non-contrast · 0.94mm/px · z∈[+886,+1492]mm · 10 of 141 slices shown, 12 images]
[im 10/141  soft-tissue]
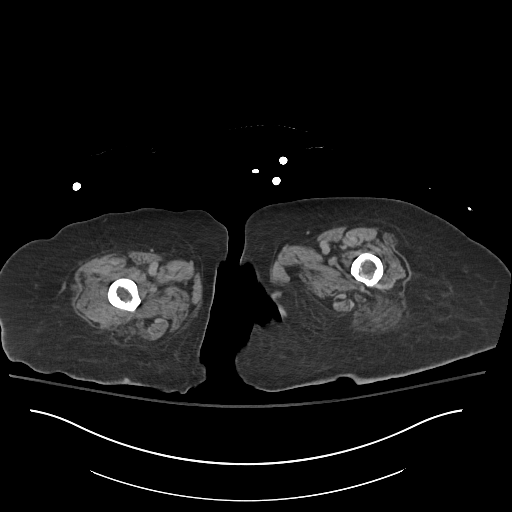
[im 10/141  bone]
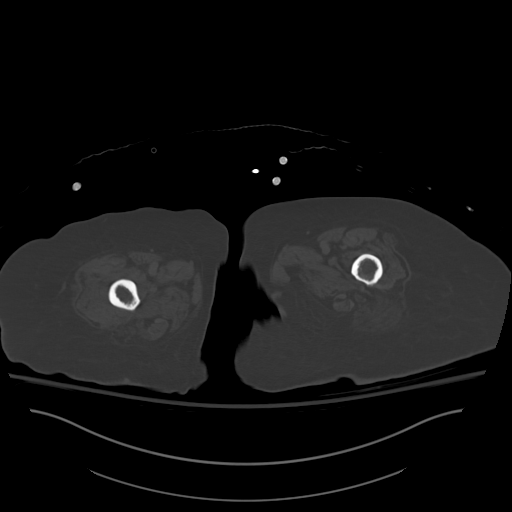
[im 29/141  soft-tissue]
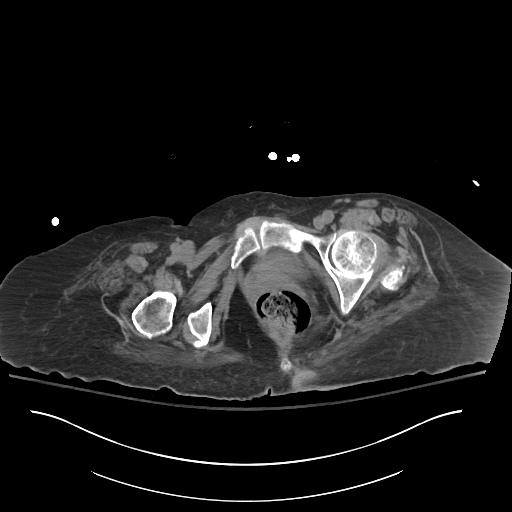
[im 38/141  soft-tissue]
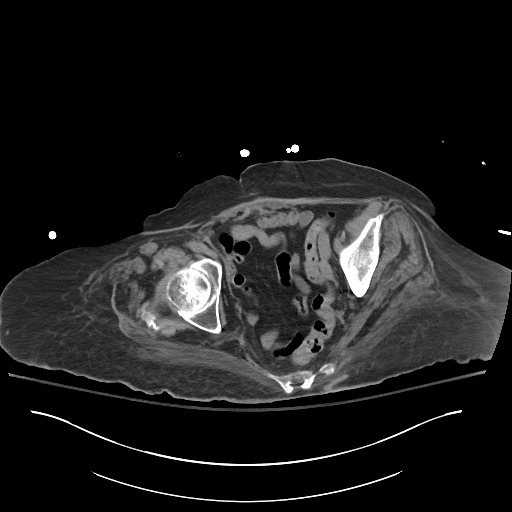
[im 47/141  soft-tissue]
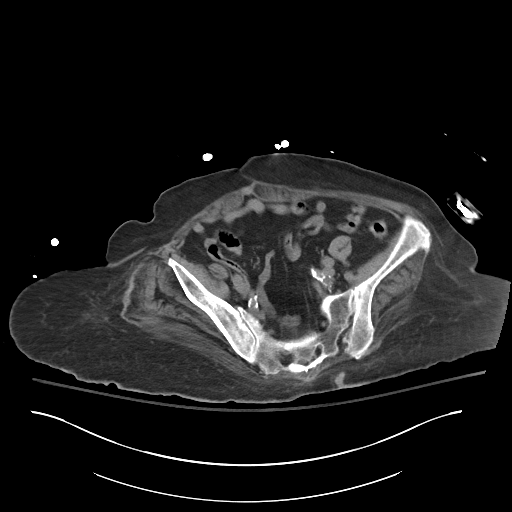
[im 66/141  soft-tissue]
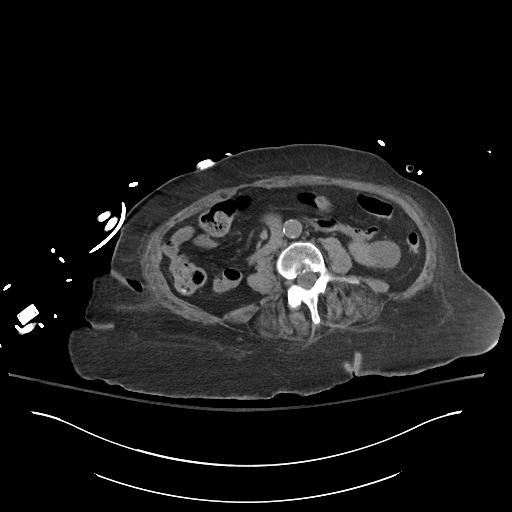
[im 75/141  soft-tissue]
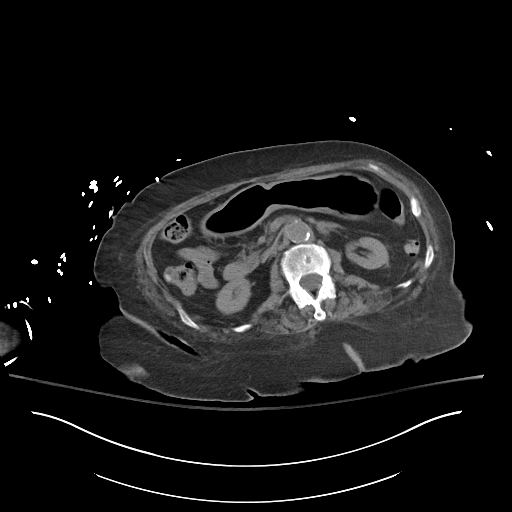
[im 94/141  soft-tissue]
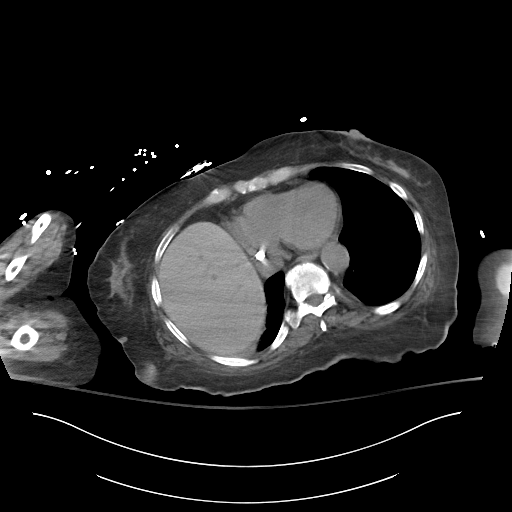
[im 103/141  soft-tissue]
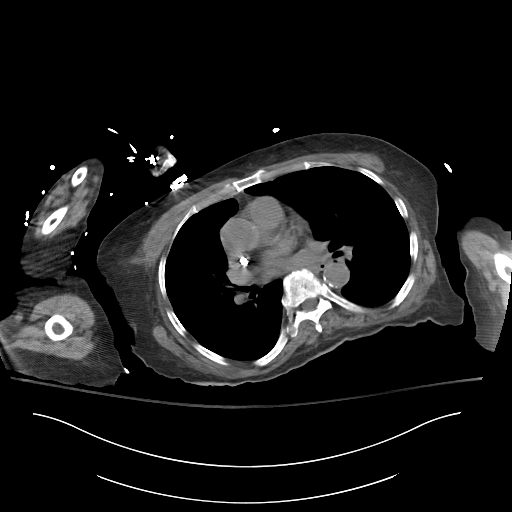
[im 113/141  soft-tissue]
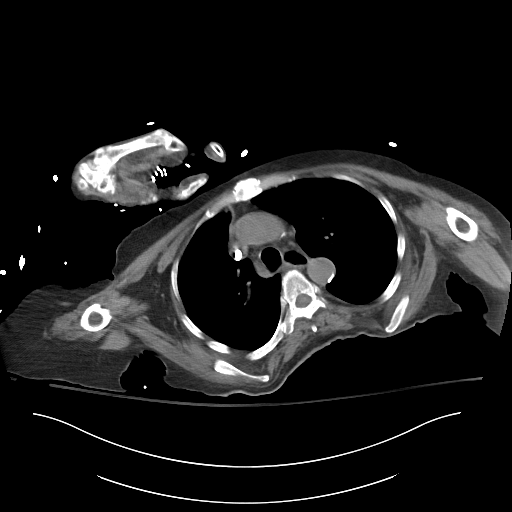
[im 113/141  bone]
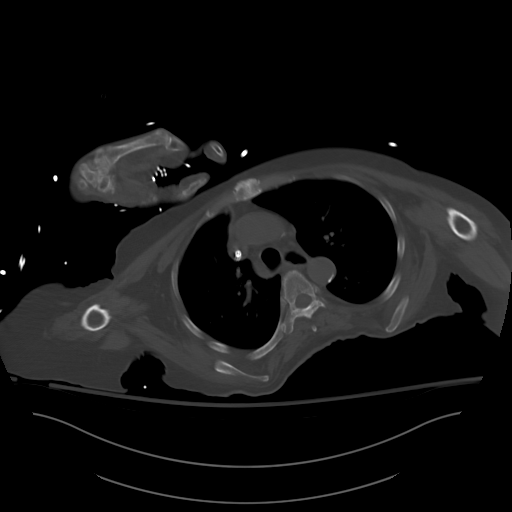
[im 131/141  soft-tissue]
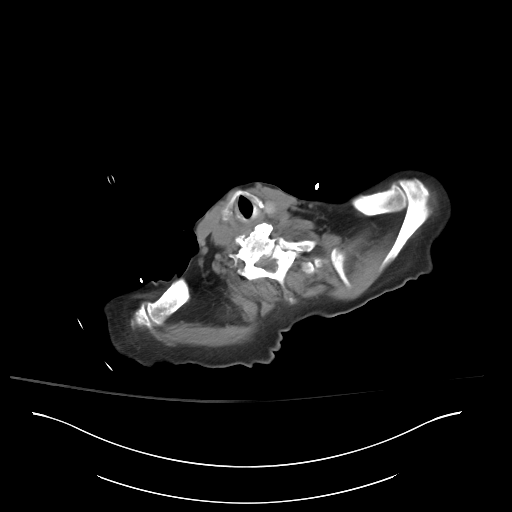

[Series 6: cor · coronal · 0.73mm/px · 3 of 83 slices shown]
[im 28/83  soft-tissue]
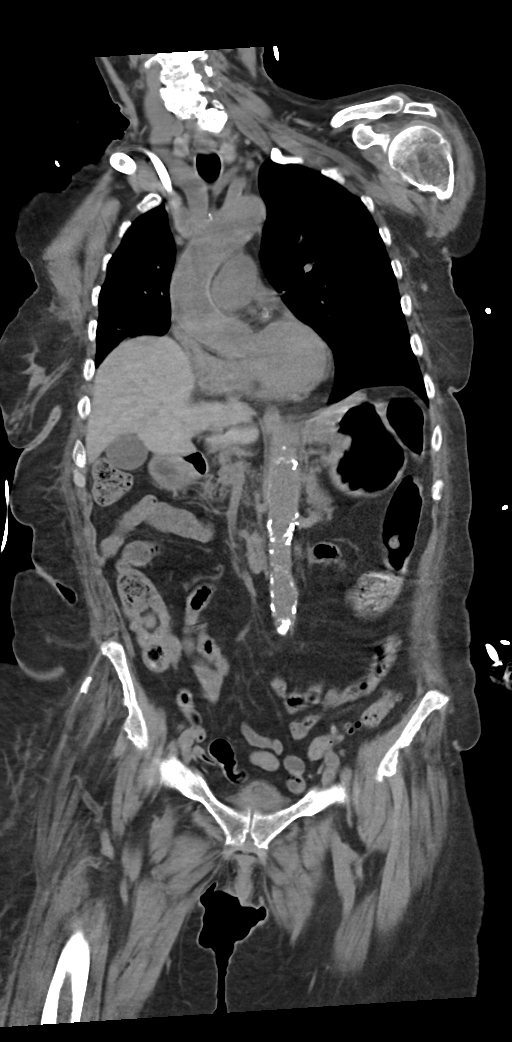
[im 37/83  soft-tissue]
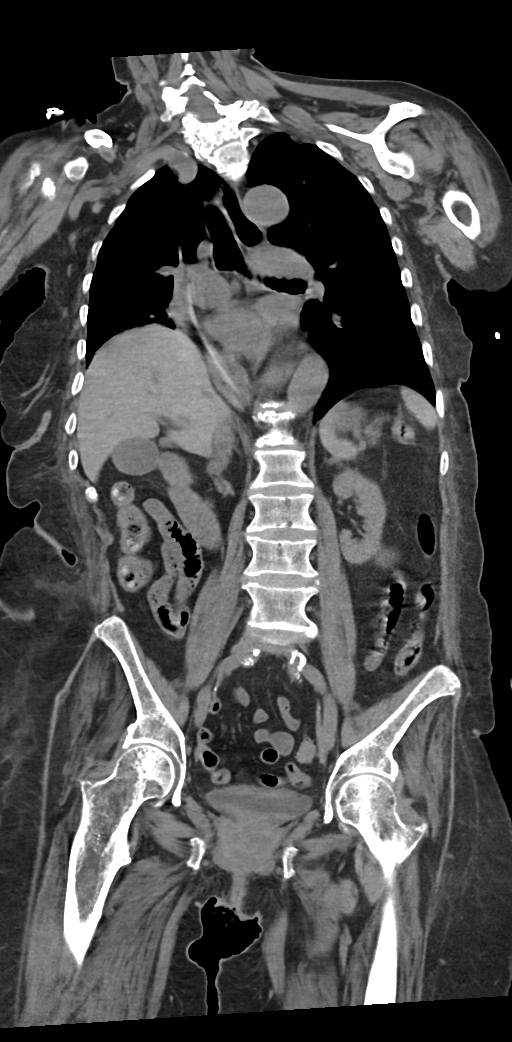
[im 46/83  soft-tissue]
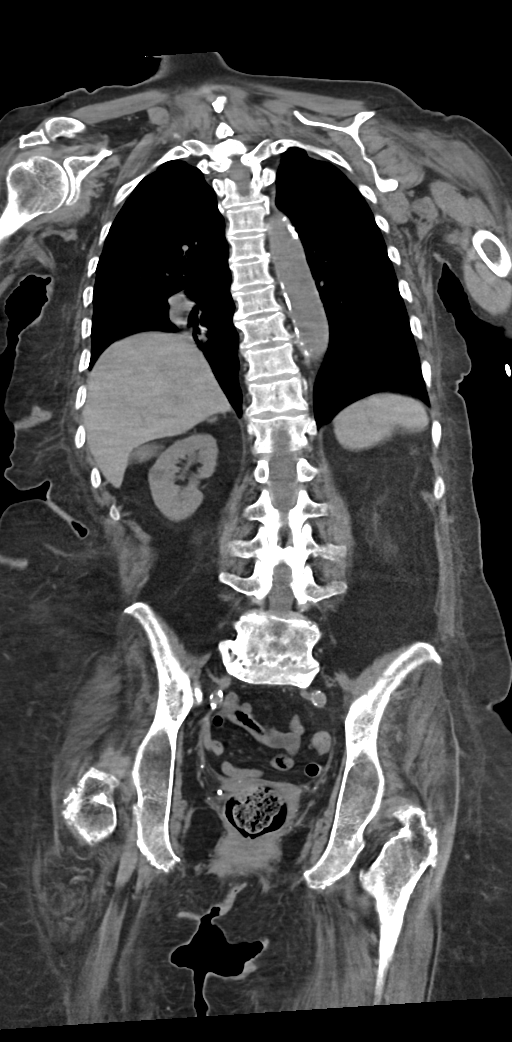

[13 of 46 positions shown; findings below may reference images not displayed]

FINDINGS: CT CHEST FINDINGS

Cardiovascular: Heart is normal in size.  No pericardial effusion.

No evidence of thoracic aortic aneurysm. Atherosclerotic
calcifications of the aortic arch.

Right dialysis catheter terminates in the inferior right atrium.

Mediastinum/Nodes: No suspicious mediastinal lymphadenopathy.

Visualized thyroid is unremarkable.

Lungs/Pleura: Mild centrilobular and paraseptal emphysematous
changes, upper lung predominant.

Eventration of the right hemidiaphragm with overlying mild
atelectasis.

No focal consolidation.

No suspicious pulmonary nodules.

No pleural effusion or pneumothorax.

Musculoskeletal: Mild degenerative changes of the lower thoracic
spine.

CT ABDOMEN PELVIS FINDINGS

Hepatobiliary: Unenhanced liver is unremarkable.

Gallbladder is unremarkable. No intrahepatic or extrahepatic ductal
dilatation.

Pancreas: Within normal limits.

Spleen: Within normal limits.

Adrenals/Urinary Tract: Adrenal glands are within normal limits.

Kidneys are notable for a 3.0 cm left lower pole renal cyst (series
3/image 76). 13 mm interpolar right renal cyst (series 3/image 62).
No renal calculi or hydronephrosis.

Thick-walled bladder, although underdistended.

Stomach/Bowel: Stomach is within normal limits.

No evidence of bowel obstruction.

Appendix is not discretely visualized.

Mild sigmoid diverticulosis, without evidence of diverticulitis.

Vascular/Lymphatic: No evidence of abdominal aortic aneurysm.

Atherosclerotic calcifications of the abdominal aorta and branch
vessels.

No suspicious abdominopelvic lymphadenopathy.

Reproductive: Status post hysterectomy.

Bilateral ovaries are within normal limits.

Other: No abdominopelvic ascites.

Musculoskeletal: Mild degenerative changes of the lumbar spine, most
prominent at L5-S1.
IMPRESSION: No evidence of acute cardiopulmonary disease.

No evidence of obstruction. Appendix is not discretely visualized.
Mild sigmoid diverticulosis, without evidence of diverticulitis.

Additional ancillary findings as above.

Aortic Atherosclerosis (CJFVJ-ROW.W) and Emphysema (CJFVJ-AB4.B).
Pleural

## 2022-02-04 IMAGING — US IR FLUORO GUIDE CV LINE*L*
1 series · 6 of 6 positions shown · non-contrast
Comparison: CT chest from 11/10/2020

INDICATION: 75-year-old female with history of end-stage renal disease on
hemodialysis presenting with acute pulmonary embolism and poor
venous access.

EXAM:
NON-TUNNELED CENTRAL VENOUS CATHETER PLACEMENT WITH ULTRASOUND AND
FLUOROSCOPIC GUIDANCE

[Series 1: ir fluoro guide cv line*left* · 6 of 6 slices shown]
[im 1/6]
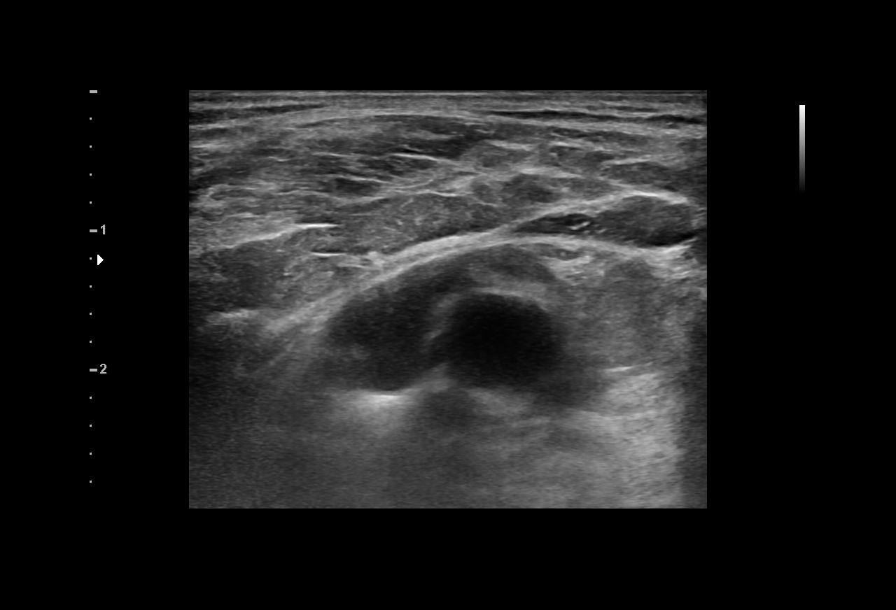
[im 2/6]
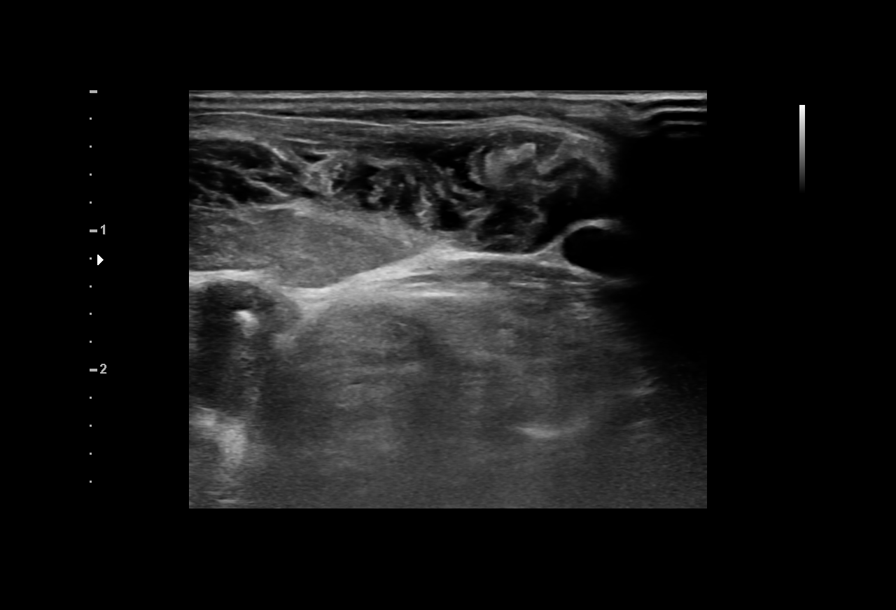
[im 3/6]
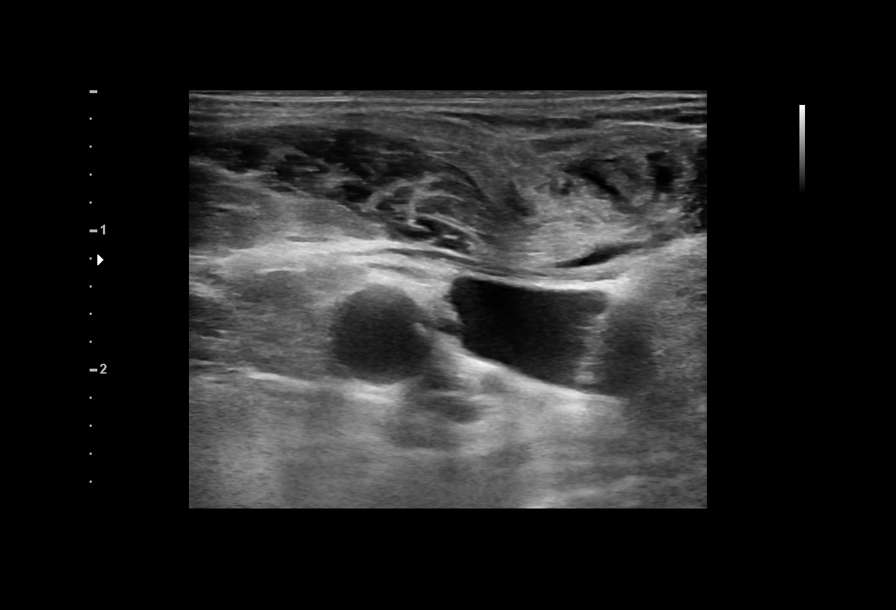
[im 4/6]
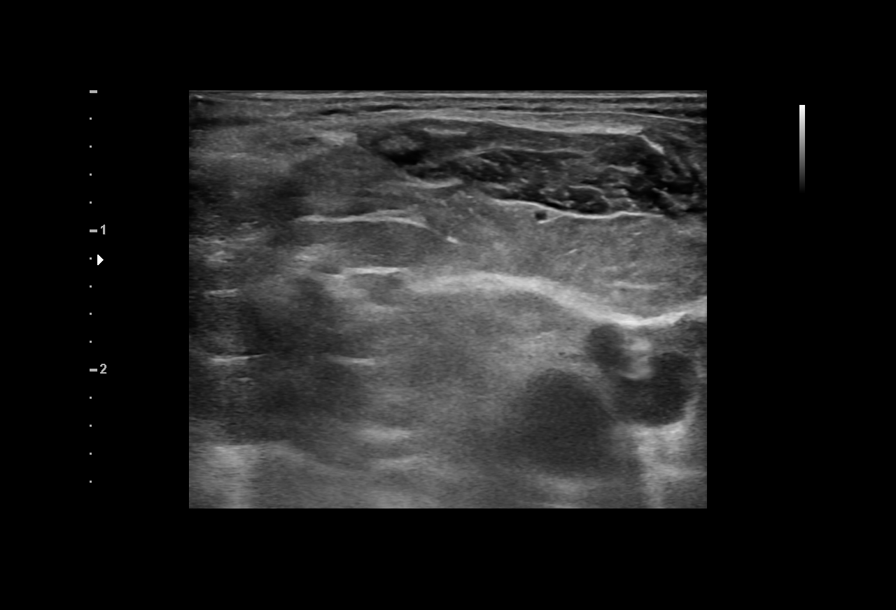
[im 5/6]
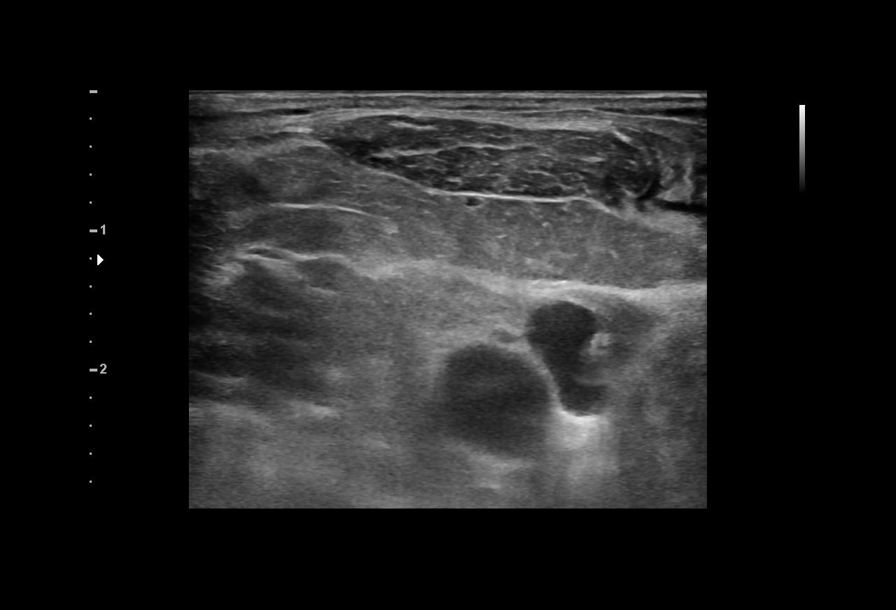
[im 6/6]
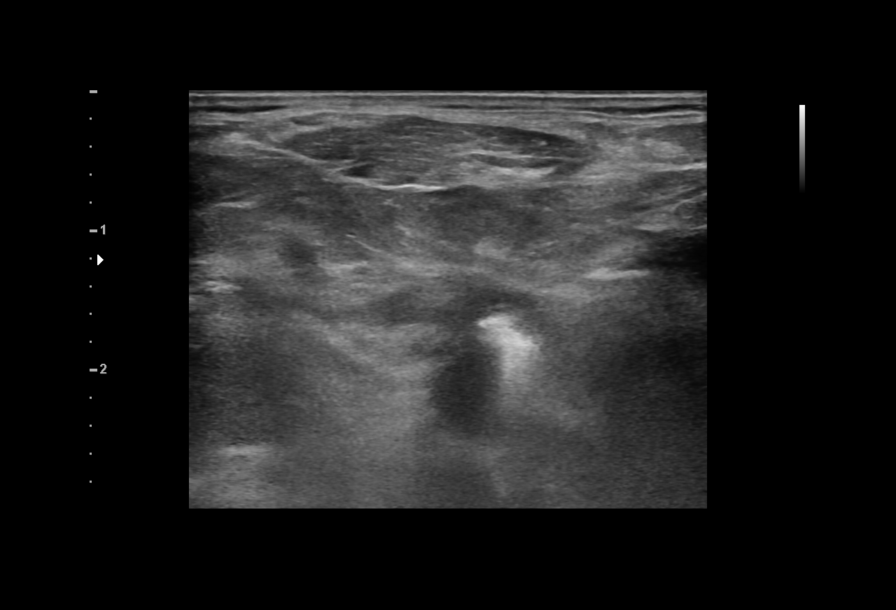

[6 of 6 positions shown; findings below may reference images not displayed]

MEDICATIONS:
None

FLUOROSCOPY TIME:  0 minutes, 12 seconds (1 mGy)

COMPLICATIONS:
None immediate.

PROCEDURE:
Informed written consent was obtained from the patient's family
after a discussion of the risks, benefits, and alternatives to
treatment. Questions regarding the procedure were encouraged and
answered. The left neck and chest were prepped with chlorhexidine in
a sterile fashion, and a sterile drape was applied covering the
operative field. Maximum barrier sterile technique with sterile
gowns and gloves were used for the procedure. A timeout was
performed prior to the initiation of the procedure.

After the overlying soft tissues were anesthetized, a small venotomy
incision was created and a micropuncture kit was utilized to access
the internal jugular vein. Real-time ultrasound guidance was
utilized for vascular access including the acquisition of a
permanent ultrasound image documenting patency of the accessed
vessel.

And 0.035 inch guidewire advanced to the level of the IVC. Under
fluoroscopic guidance, the venotomy was serially dilated, ultimately
allowing placement of a triple-lumen central venous catheter
catheter with tip ultimately terminating within the superior
cavoatrial junction. Final catheter positioning was confirmed and
documented with a spot radiographic image. The catheter aspirates
and flushes normally. The catheter was flushed with appropriate
volume heparin dwells.

The catheter exit site was secured with a 0-Silk retention suture. A
dressing was placed. The patient tolerated the procedure well
without immediate post procedural complication.
IMPRESSION: Successful placement of a left internal jugular approach
triple-lumen central venous catheter with tip terminating with in
the superior superior cavoatrial junction.

PLAN:
The catheter is ready for immediate use.

## 2022-02-04 IMAGING — CT CT ANGIO CHEST
2 of 6 series · 18 of 36 positions shown · IV contrast (omnipaque)
Comparison: Same day chest CT.

CLINICAL DATA: PE suspected, high prob

EXAM:
CT ANGIOGRAPHY CHEST WITH CONTRAST
TECHNIQUE: Multidetector CT imaging of the chest was performed using the
standard protocol during bolus administration of intravenous
contrast. Multiplanar CT image reconstructions and MIPs were
obtained to evaluate the vascular anatomy.
CONTRAST:  75mL OMNIPAQUE IOHEXOL 350 MG/ML SOLN

[Series 8: pe thins · axial · 0.73mm/px · z∈[+1198,+1469]mm · 17 of 431 slices shown]
[im 22/431  lung]
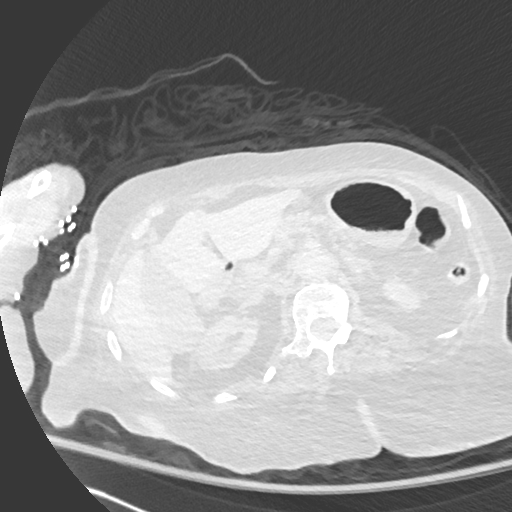
[im 44/431  mediastinal]
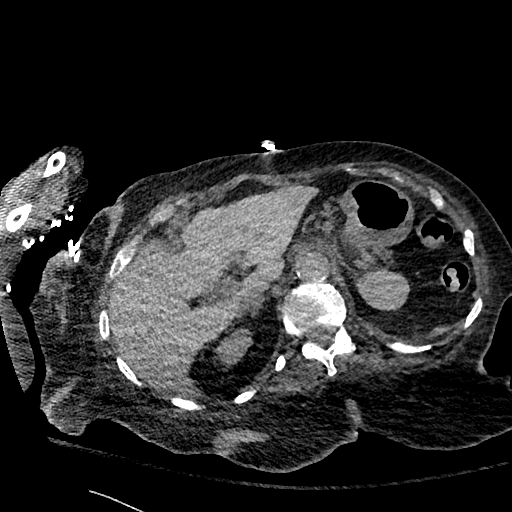
[im 65/431  lung]
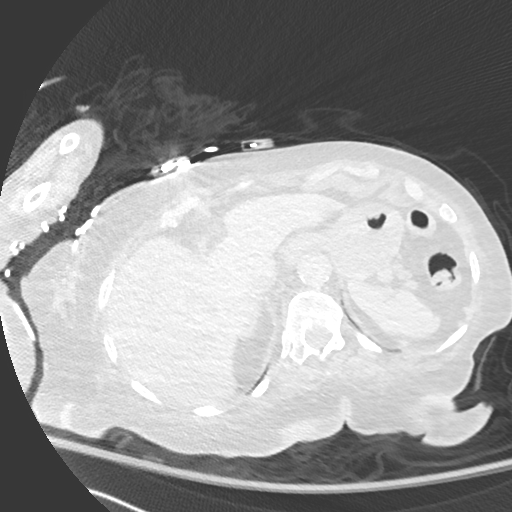
[im 87/431  mediastinal]
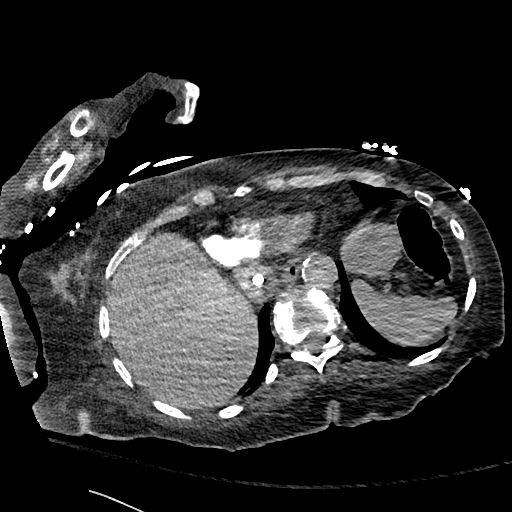
[im 130/431  lung]
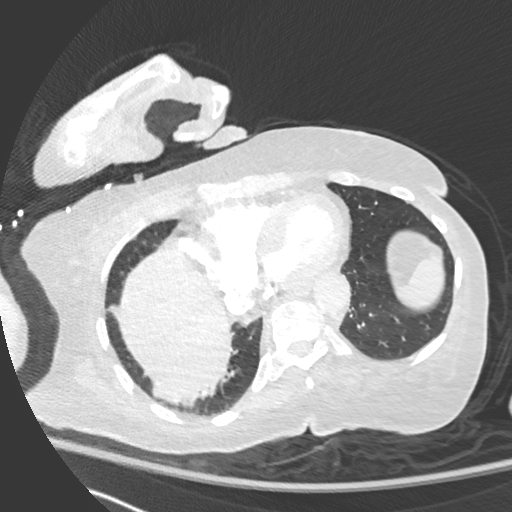
[im 151/431  mediastinal]
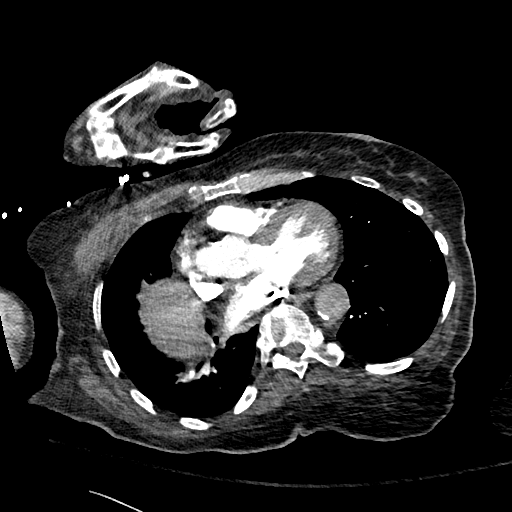
[im 173/431  lung]
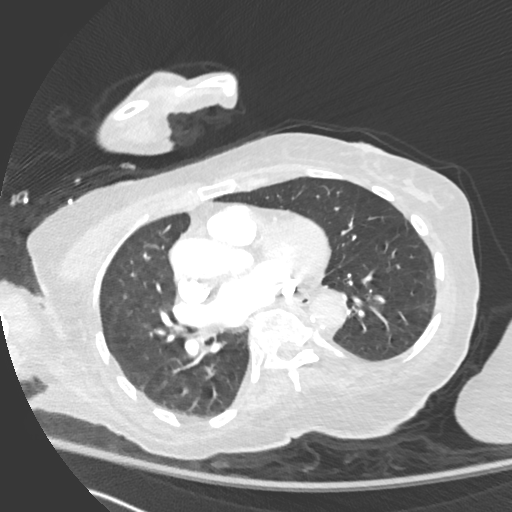
[im 194/431  mediastinal]
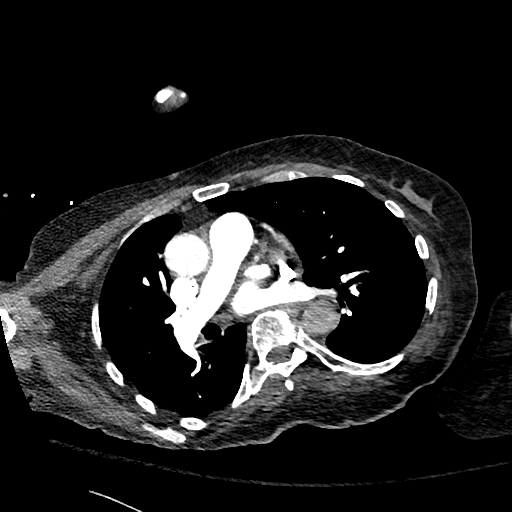
[im 216/431  lung]
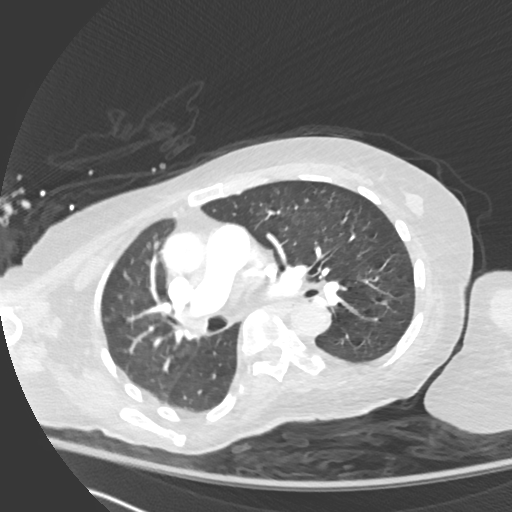
[im 237/431  mediastinal]
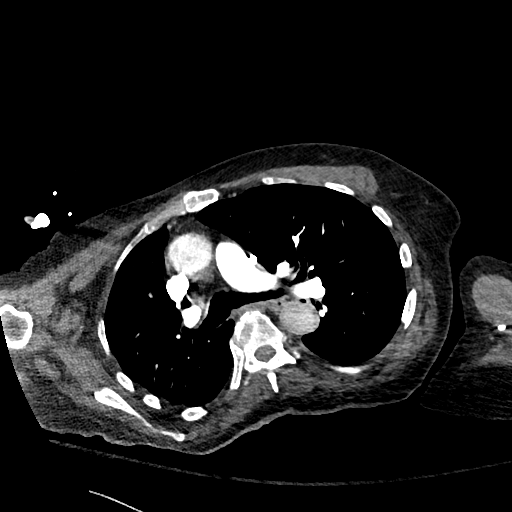
[im 259/431  lung]
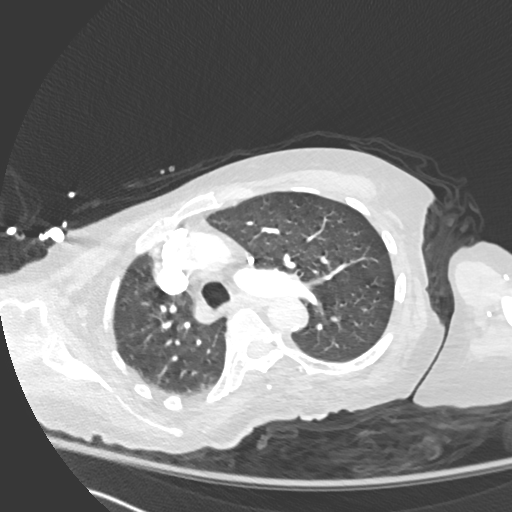
[im 280/431  mediastinal]
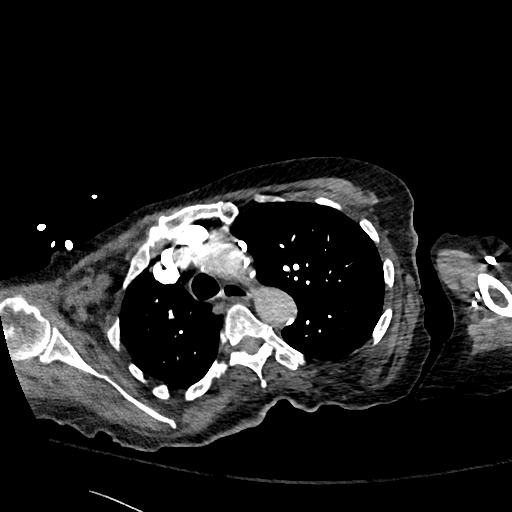
[im 302/431  lung]
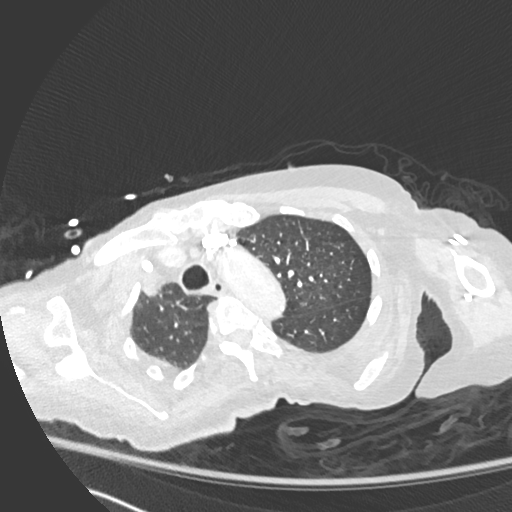
[im 345/431  mediastinal]
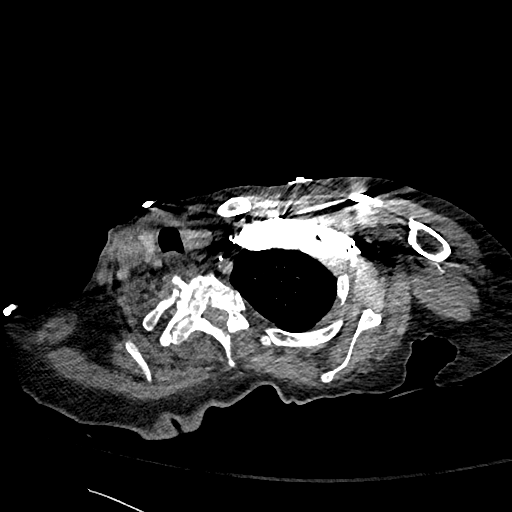
[im 366/431  lung]
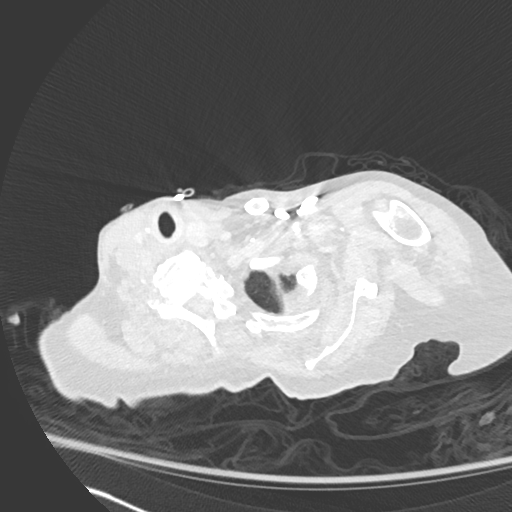
[im 388/431  mediastinal]
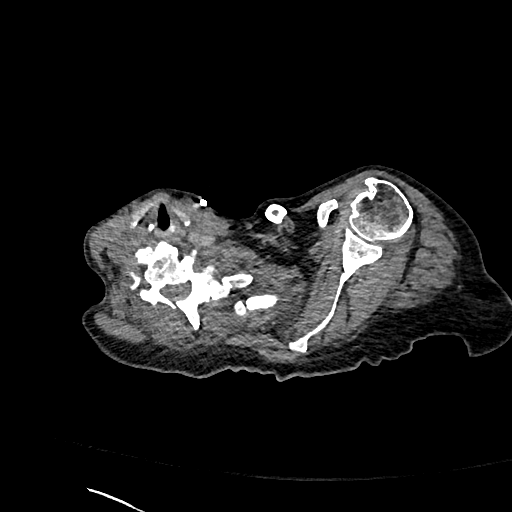
[im 409/431  lung]
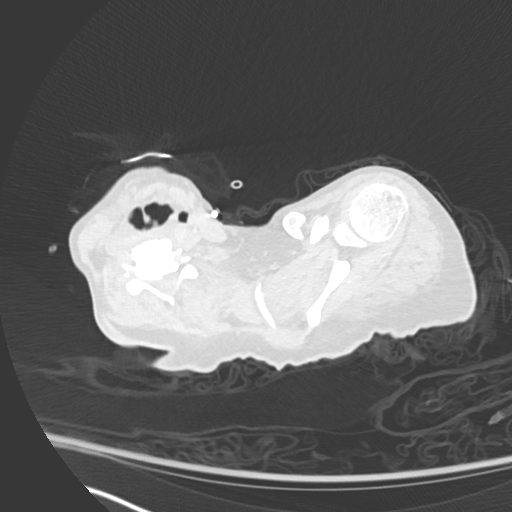

[Series 9: pe 2mm cor · coronal · 0.58mm/px · 1 of 151 slices shown]
[im 76/151  mediastinal]
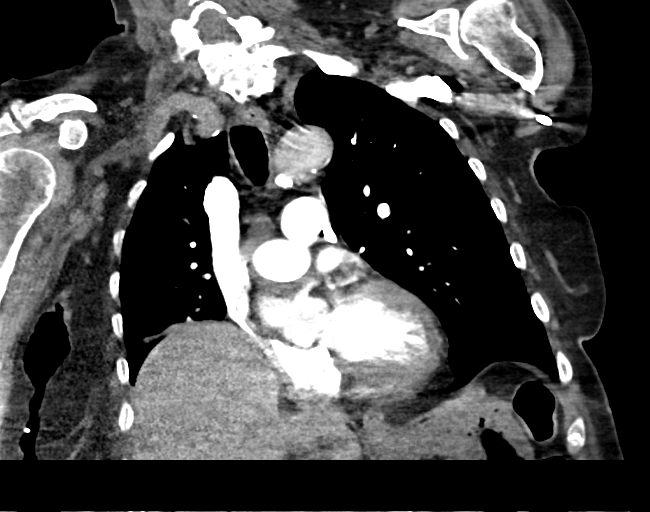

[18 of 36 positions shown; findings below may reference images not displayed]

FINDINGS: Cardiovascular: Normal cardiac size.No pericardial disease.There is
a pulmonary embolism within a left upper lobe segmental pulmonary
artery (series 6, images 60-57). RV: LV ratio is 0.8.Moderate
calcifications of the thoracic aorta. Right neck approach dialysis
catheter terminates in near the inferior cavoatrial junction.

Mediastinum/Nodes: No lymphadenopathy.The thyroid is
unremarkable.Esophagus is unremarkable.The trachea is unremarkable.

Lungs/Pleura: Central airways are clear. The trachea is
unremarkable. Mild centrilobular and paraseptal emphysematous
change. Right basilar atelectasis. There is no focal airspace
consolidation.No suspicious pulmonary nodules or masses.No pleural
effusion.No pneumothorax.

Upper Abdomen: No acute abnormality.

Musculoskeletal: No acute osseous abnormality.No suspicious lytic or
blastic lesions. Multilevel degenerative changes of the spine.

Review of the MIP images confirms the above findings.
IMPRESSION: Acute pulmonary embolism within the left upper lobe segmental
pulmonary artery. No evidence of right heart strain.

Aortic Atherosclerosis (UG5RI-CFV.V) and Emphysema ([id number].Q).

Critical Value/emergent results were called by telephone at the time
YARD , who verbally acknowledged these results.

## 2022-03-09 IMAGING — CT CT ANGIO CHEST
2 of 6 series · 12 of 36 positions shown · IV contrast (Omni 300)
Comparison: None.

CLINICAL DATA: found to have low oxygen and placed on 2L [HOSPITAL]. PE
suspected, low/intermediate prob, positive D-dimer

EXAM:
CT ANGIOGRAPHY CHEST WITH CONTRAST
TECHNIQUE: Multidetector CT imaging of the chest was performed using the
standard protocol during bolus administration of intravenous
contrast. Multiplanar CT image reconstructions and MIPs were
obtained to evaluate the vascular anatomy.
CONTRAST:  80mL OMNIPAQUE IOHEXOL 350 MG/ML SOLN

[Series 10: pe thins · axial · 0.47mm/px · z∈[+1109,+1338]mm · 11 of 364 slices shown]
[im 19/364  lung]
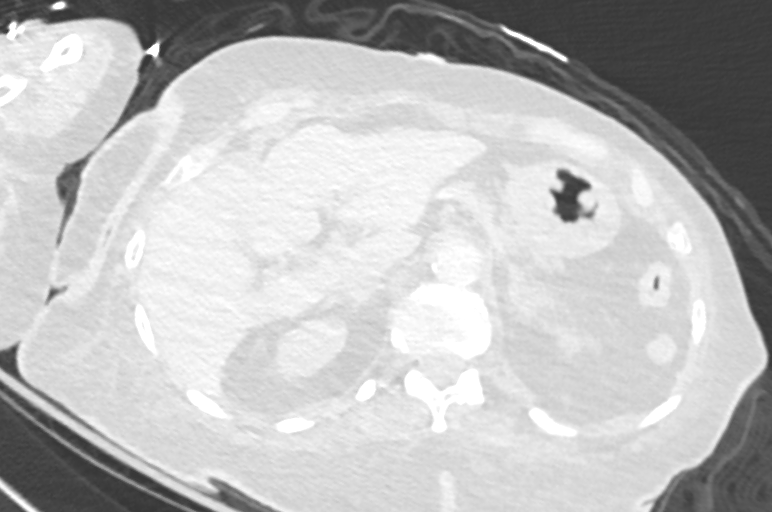
[im 55/364  mediastinal]
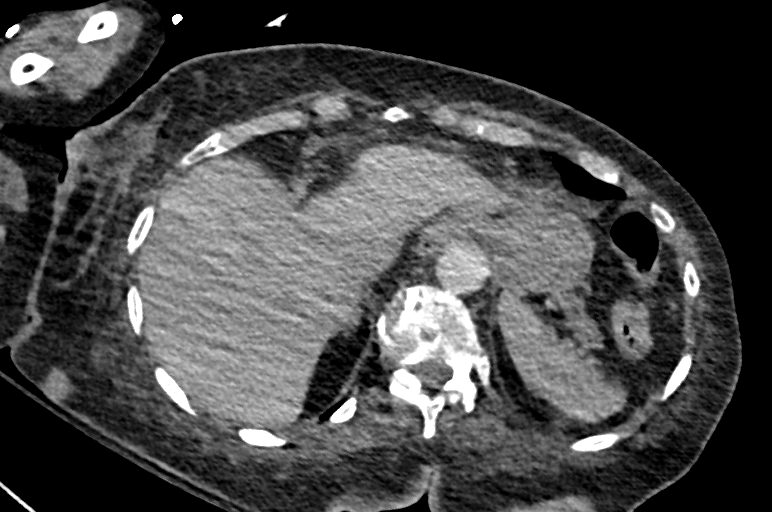
[im 91/364  lung]
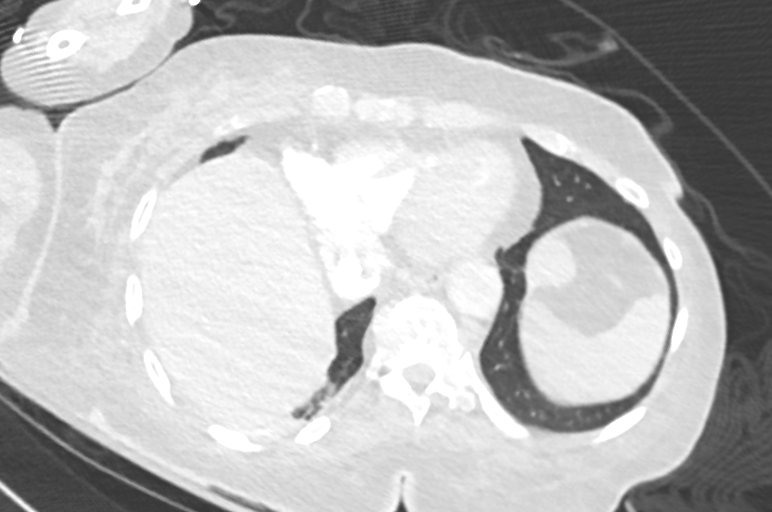
[im 128/364  mediastinal]
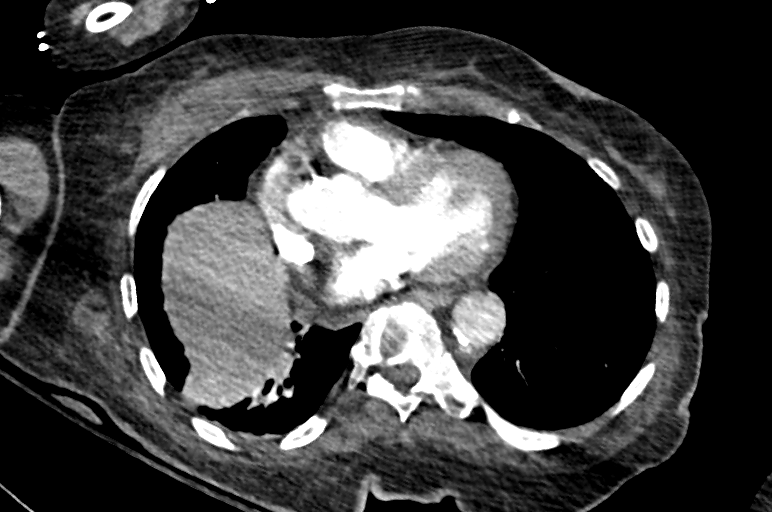
[im 146/364  lung]
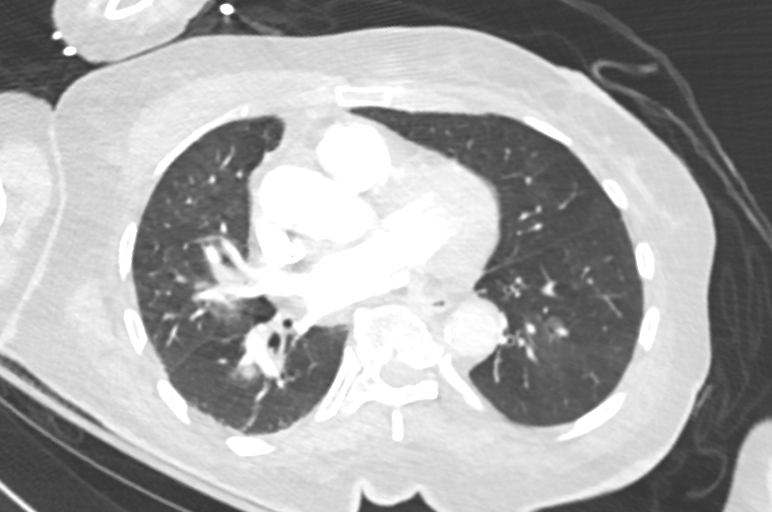
[im 182/364  mediastinal]
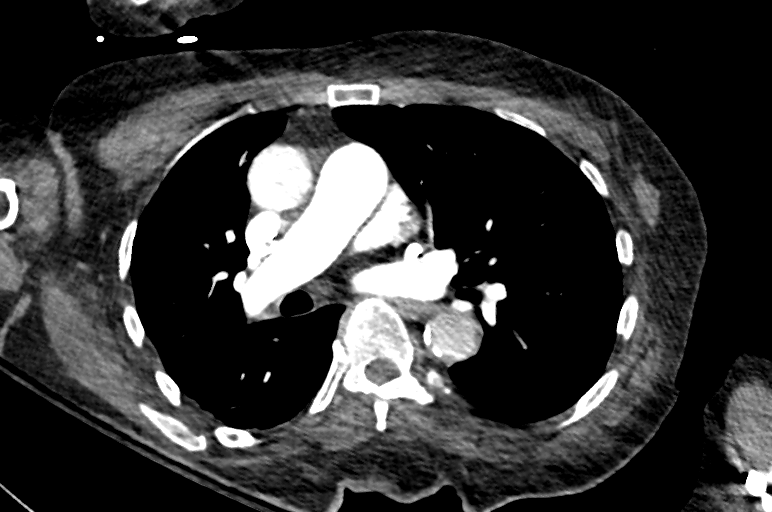
[im 218/364  lung]
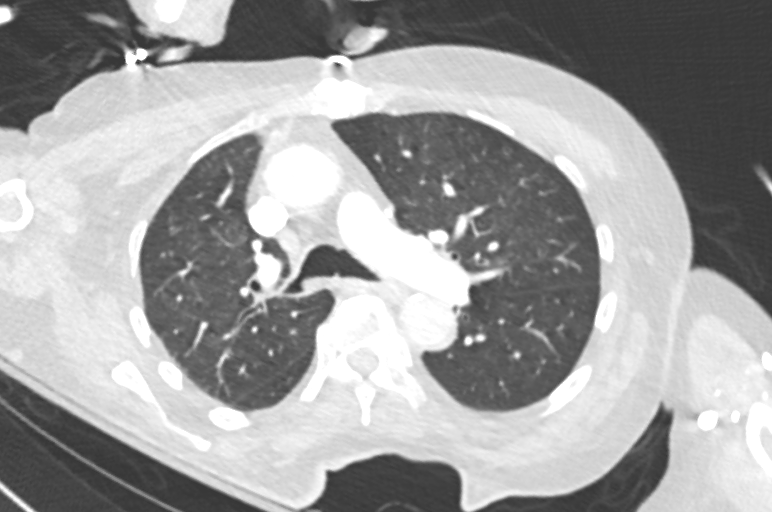
[im 236/364  mediastinal]
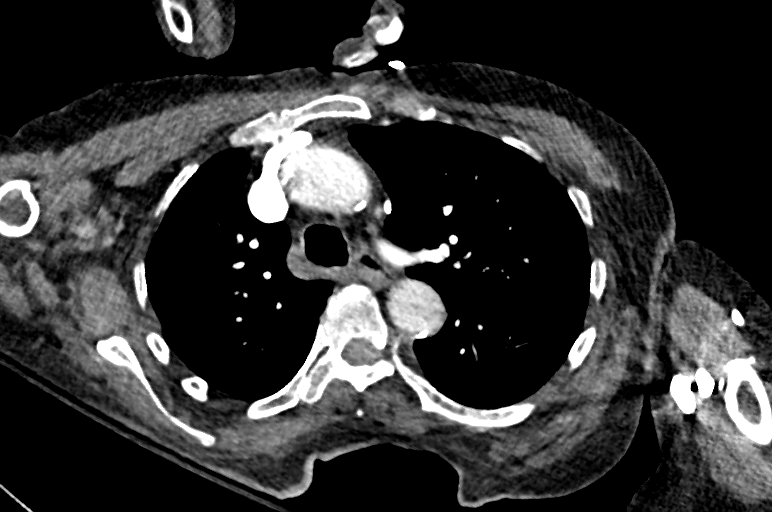
[im 273/364  lung]
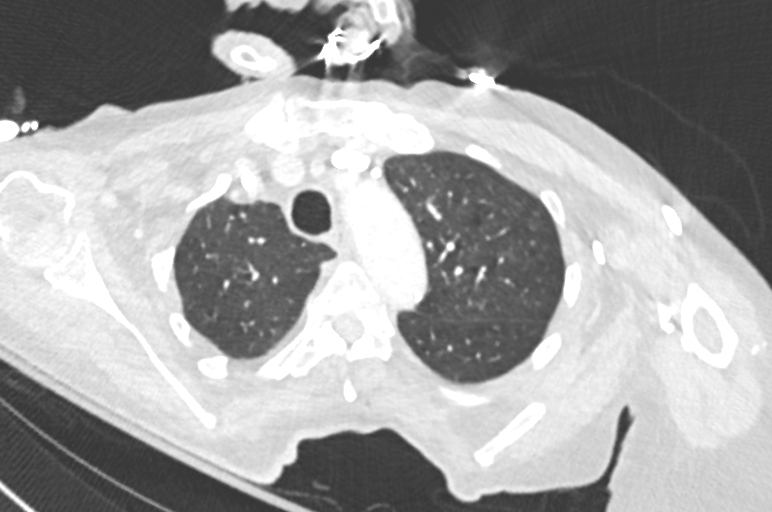
[im 309/364  mediastinal]
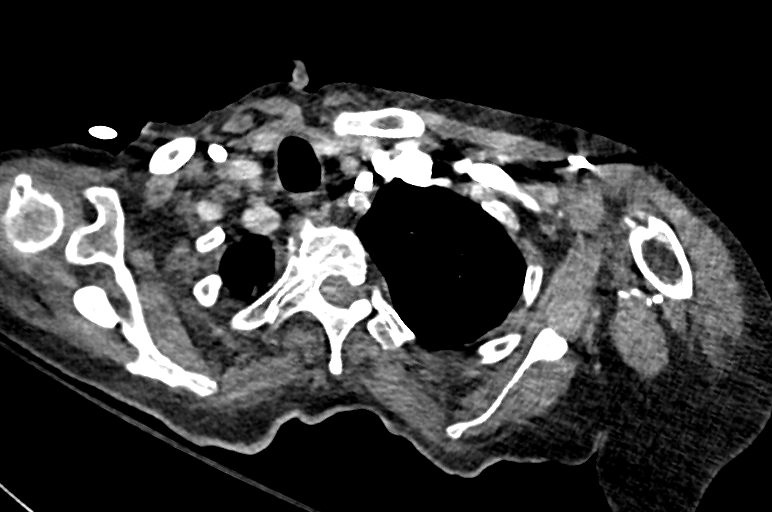
[im 345/364  lung]
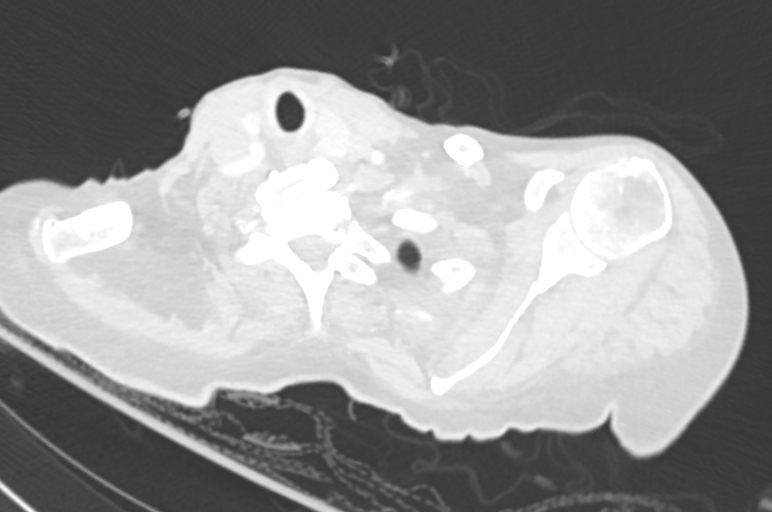

[Series 12: pe 2mm cor · coronal · 0.50mm/px · 1 of 128 slices shown]
[im 64/128  mediastinal]
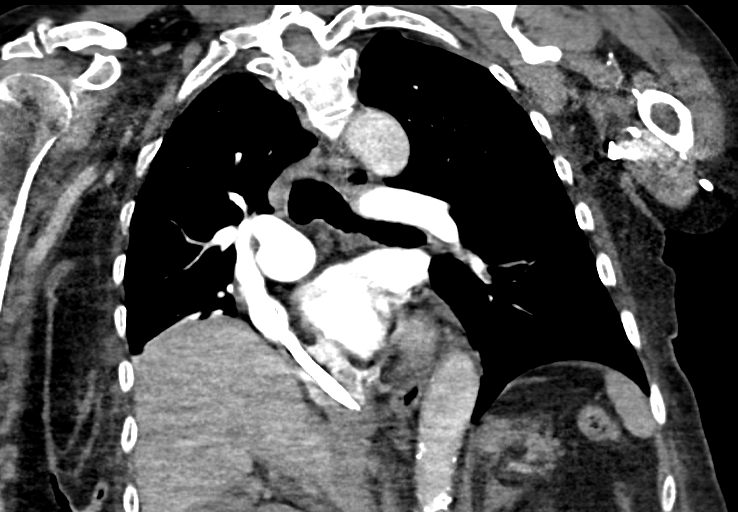

[12 of 36 positions shown; findings below may reference images not displayed]

FINDINGS: Lines and tubes: Dialysis catheter with tip terminating at the
inferior cavoatrial junction.

Cardiovascular: Satisfactory opacification of the pulmonary arteries
to the segmental level. Left lower lobe segmental pulmonary embolus.
Normal heart size. No pericardial effusion. Atherosclerotic plaque.
At least right main coronary artery calcification. Incidentally
noted duplicated superior vena cava.

Mediastinum/Nodes: No enlarged mediastinal, hilar, or axillary lymph
nodes. Thyroid gland, trachea, and esophagus demonstrate no
significant findings. Debris within the right mainstem bronchi.
Elevated right hemidiaphragm.

Lungs/Pleura: Trace consolidation with air bronchogram at the right
base. No pulmonary nodule. No pulmonary mass. No pleural effusion.
No pneumothorax.

Upper Abdomen: No acute abnormality.

Musculoskeletal:

No chest wall abnormality.

No suspicious lytic or blastic osseous lesions. No acute displaced
fracture. Multilevel degenerative changes of the spine. Partially
visualized right upper extremity with likely severe degenerative
changes of the wrist.

Review of the MIP images confirms the above findings.
IMPRESSION: 1. Left lower lobe segmental pulmonary embolus. No findings of right
heart strain or pulmonary infarction.
2. Debris within the right mainstem bronchi with trace consolidation
at the right base which may be due to atelectasis in the setting of
an elevated right hemidiaphragm versus developing
infection/inflammation.
3. Dialysis catheter with tip terminating at the inferior cavoatrial
junction.
4.  Aortic Atherosclerosis ([id number]-[id number].L).

These results were called by telephone at the time of interpretation
on 12/14/2020 at [DATE] to provider Dr. Mahabad, who verbally
acknowledged these results.

## 2022-03-22 IMAGING — DX DG CHEST 1V PORT
1 series · 1 of 1 positions shown · non-contrast
Comparison: CT a chest in chest x-ray dated December 13, 2020.

CLINICAL DATA: Possible sepsis.

EXAM:
PORTABLE CHEST 1 VIEW

[chest ap]
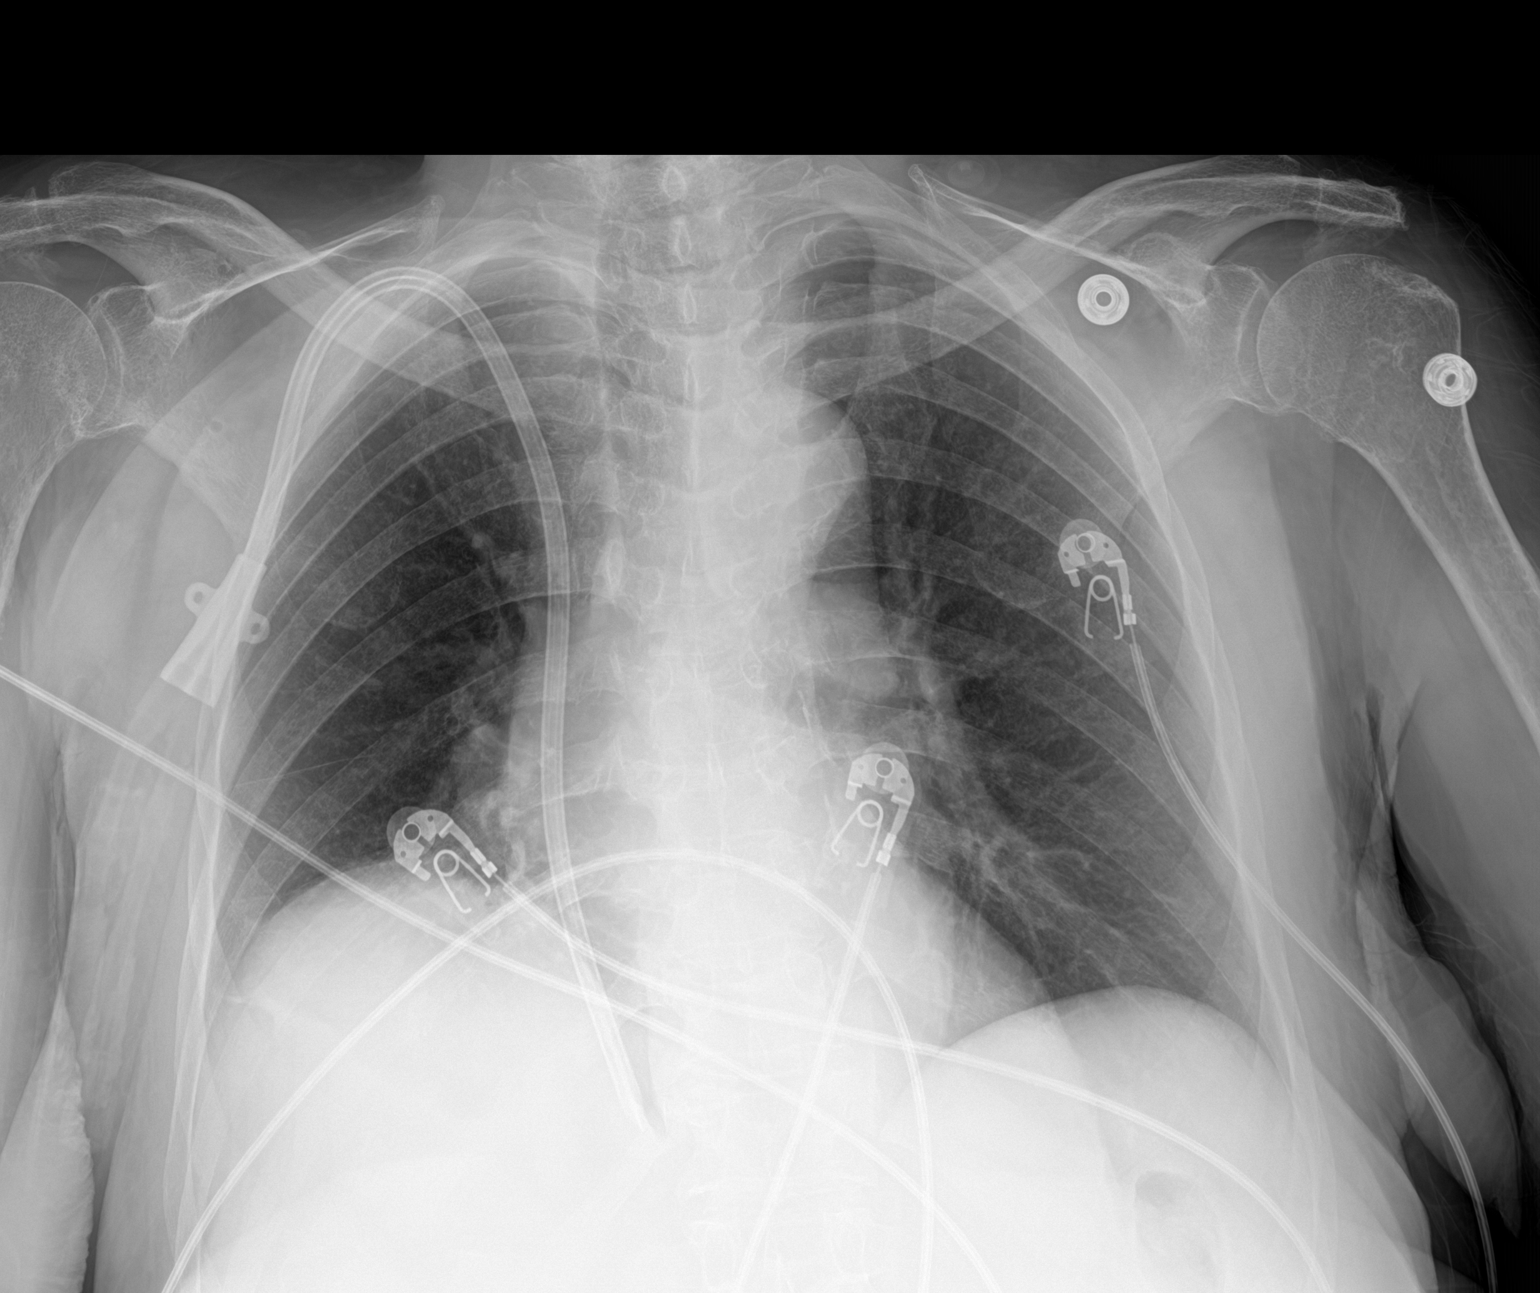

[1 of 1 positions shown; findings below may reference images not displayed]

FINDINGS: Unchanged tunneled right internal jugular dialysis catheter with tip
in the inferior right atrium. The heart size and mediastinal
contours are within normal limits. Normal pulmonary vascularity. No
focal consolidation, pleural effusion, or pneumothorax. Unchanged
elevation of the right hemidiaphragm. No acute osseous abnormality.
IMPRESSION: 1. No active disease.

## 2022-03-23 IMAGING — DX DG CHEST 1V PORT
1 series · 1 of 1 positions shown · non-contrast
Comparison: Chest x-ray 12/26/2020.

CLINICAL DATA: 75-year-old female with history of wound infection.
Sepsis.

EXAM:
PORTABLE CHEST 1 VIEW

[chest ap]
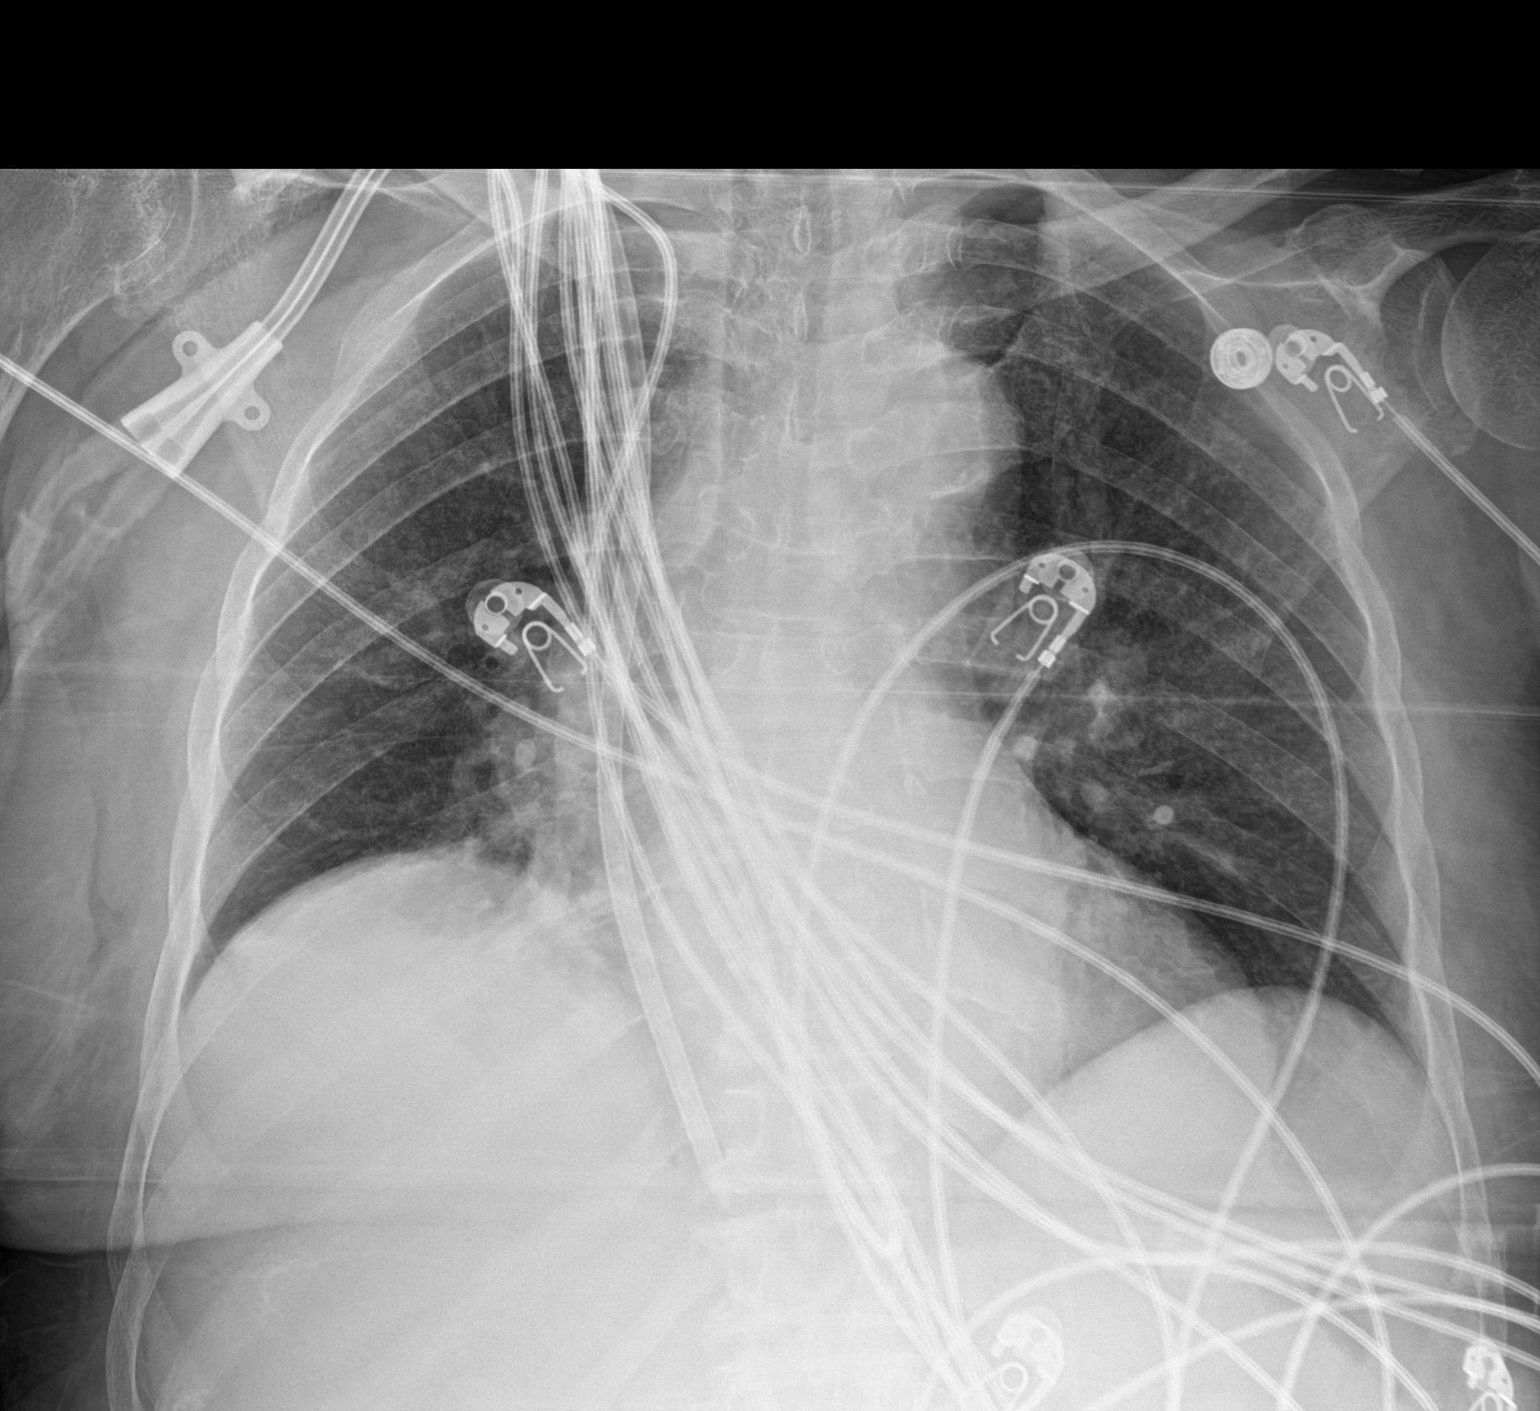

[1 of 1 positions shown; findings below may reference images not displayed]

FINDINGS: Right internal jugular PermCath with tip terminating in the inferior
aspect of the right atrium. Chronic elevation of the right
hemidiaphragm, similar to the prior study. Lung volumes are low. No
consolidative airspace disease. No pleural effusions. No
pneumothorax. No pulmonary nodule or mass noted. Pulmonary
vasculature and the cardiomediastinal silhouette are within normal
limits. Atherosclerotic calcifications in the thoracic aorta.
Healing nondisplaced fracture of the lateral aspect of the left
ninth rib is noted.
IMPRESSION: 1. Low lung volumes without radiographic evidence of acute
cardiopulmonary disease.
2. Aortic atherosclerosis.
3. Healing nondisplaced left lateral ninth rib fracture.
